# Patient Record
Sex: Male | Born: 1960
Health system: Southern US, Community
[De-identification: ages and names within clinical notes are randomized; demographics above are authoritative.]

## PROBLEM LIST (undated history)

## (undated) ENCOUNTER — Emergency Department (HOSPITAL_COMMUNITY): Admission: EM | Payer: 59 | Source: Home / Self Care

## (undated) DIAGNOSIS — I509 Heart failure, unspecified: Secondary | ICD-10-CM

## (undated) DIAGNOSIS — J449 Chronic obstructive pulmonary disease, unspecified: Secondary | ICD-10-CM

## (undated) DIAGNOSIS — I10 Essential (primary) hypertension: Secondary | ICD-10-CM

## (undated) DIAGNOSIS — M1990 Unspecified osteoarthritis, unspecified site: Secondary | ICD-10-CM

## (undated) DIAGNOSIS — E119 Type 2 diabetes mellitus without complications: Secondary | ICD-10-CM

## (undated) DIAGNOSIS — I2510 Atherosclerotic heart disease of native coronary artery without angina pectoris: Secondary | ICD-10-CM

## (undated) DIAGNOSIS — I480 Paroxysmal atrial fibrillation: Secondary | ICD-10-CM

## (undated) HISTORY — PX: HAND SURGERY: SHX662

## (undated) HISTORY — PX: BACK SURGERY: SHX140

## (undated) HISTORY — PX: FOOT SURGERY: SHX648

---

## 2007-12-16 ENCOUNTER — Emergency Department (HOSPITAL_COMMUNITY): Admission: EM | Admit: 2007-12-16 | Discharge: 2007-12-16 | Payer: Self-pay | Admitting: Emergency Medicine

## 2009-04-29 ENCOUNTER — Emergency Department (HOSPITAL_COMMUNITY): Admission: EM | Admit: 2009-04-29 | Discharge: 2009-04-29 | Payer: Self-pay | Admitting: Emergency Medicine

## 2009-11-13 ENCOUNTER — Emergency Department (HOSPITAL_COMMUNITY): Admission: EM | Admit: 2009-11-13 | Discharge: 2009-11-13 | Payer: Self-pay | Admitting: Emergency Medicine

## 2010-01-18 ENCOUNTER — Inpatient Hospital Stay (HOSPITAL_COMMUNITY): Admission: EM | Admit: 2010-01-18 | Discharge: 2010-01-21 | Payer: Self-pay | Admitting: Emergency Medicine

## 2010-11-15 ENCOUNTER — Emergency Department (HOSPITAL_COMMUNITY): Admission: EM | Admit: 2010-11-15 | Discharge: 2010-10-23 | Payer: Self-pay | Admitting: Emergency Medicine

## 2011-02-27 LAB — COMPREHENSIVE METABOLIC PANEL
ALT: 21 U/L (ref 0–53)
AST: 16 U/L (ref 0–37)
BUN: 14 mg/dL (ref 6–23)
Calcium: 8.8 mg/dL (ref 8.4–10.5)
GFR calc non Af Amer: >60 mL/min (ref >60)
Total Bilirubin: 0.5 mg/dL (ref 0.3–1.2)
Total Protein: 6.7 g/dL (ref 6.0–8.3)

## 2011-02-27 LAB — BASIC METABOLIC PANEL
BUN: 15 mg/dL (ref 6–23)
CO2: 23 mEq/L (ref 19–32)
CO2: 25 mEq/L (ref 19–32)
Calcium: 8.9 mg/dL (ref 8.4–10.5)
Calcium: 9.2 mg/dL (ref 8.4–10.5)
Chloride: 100 mEq/L (ref 96–112)
Chloride: 102 mEq/L (ref 96–112)
Creatinine, Ser: 0.69 mg/dL (ref 0.4–1.5)
GFR calc Af Amer: >60 mL/min (ref >60)
GFR calc Af Amer: >60 mL/min (ref >60)
GFR calc non Af Amer: >60 mL/min (ref >60)
GFR calc non Af Amer: >60 mL/min (ref >60)
Glucose, Bld: 322 mg/dL — ABNORMAL HIGH (ref 70–99)
Sodium: 129 mEq/L — ABNORMAL LOW (ref 135–145)
Sodium: 131 mEq/L — ABNORMAL LOW (ref 135–145)
Sodium: 135 mEq/L (ref 135–145)

## 2011-02-27 LAB — DIFFERENTIAL
Basophils Absolute: 0 10*3/uL (ref 0.0–0.1)
Basophils Absolute: 0.1 10*3/uL (ref 0.0–0.1)
Basophils Relative: 0 % (ref 0–1)
Eosinophils Relative: 2 % (ref 0–5)
Lymphocytes Relative: 9 % — ABNORMAL LOW (ref 12–46)
Lymphs Abs: 0.7 10*3/uL (ref 0.7–4.0)
Lymphs Abs: 0.8 10*3/uL (ref 0.7–4.0)
Monocytes Absolute: 0.6 10*3/uL (ref 0.1–1.0)
Monocytes Absolute: 1.3 10*3/uL — ABNORMAL HIGH (ref 0.1–1.0)
Monocytes Relative: 9 % (ref 3–12)
Neutro Abs: 6.5 10*3/uL (ref 1.7–7.7)
Neutrophils Relative %: 74 % (ref 43–77)
Neutrophils Relative %: 80 % — ABNORMAL HIGH (ref 43–77)

## 2011-02-27 LAB — BLOOD GAS, ARTERIAL
Acid-base deficit: 6.7 mmol/L — ABNORMAL HIGH (ref 0.0–2.0)
Bicarbonate: 15.4 mEq/L — ABNORMAL LOW (ref 20.0–24.0)
Patient temperature: 37
TCO2: 12.9 mmol/L (ref 0–100)
pCO2 arterial: 17.5 mmHg — CL (ref 35.0–45.0)
pH, Arterial: 7.552 — ABNORMAL HIGH (ref 7.350–7.450)
pO2, Arterial: 101 mmHg — ABNORMAL HIGH (ref 80.0–100.0)

## 2011-02-27 LAB — GLUCOSE, CAPILLARY
Glucose-Capillary: 236 mg/dL — ABNORMAL HIGH (ref 70–99)
Glucose-Capillary: 253 mg/dL — ABNORMAL HIGH (ref 70–99)
Glucose-Capillary: 295 mg/dL — ABNORMAL HIGH (ref 70–99)
Glucose-Capillary: 299 mg/dL — ABNORMAL HIGH (ref 70–99)
Glucose-Capillary: 320 mg/dL — ABNORMAL HIGH (ref 70–99)
Glucose-Capillary: 358 mg/dL — ABNORMAL HIGH (ref 70–99)
Glucose-Capillary: 79 mg/dL (ref 70–99)

## 2011-02-27 LAB — CBC
HCT: 43.8 % (ref 39.0–52.0)
HCT: 49.8 % (ref 39.0–52.0)
Hemoglobin: 15.1 g/dL (ref 13.0–17.0)
MCHC: 34.5 g/dL (ref 30.0–36.0)
MCV: 85.9 fL (ref 78.0–100.0)
RBC: 5.06 MIL/uL (ref 4.22–5.81)
RDW: 12.9 % (ref 11.5–15.5)
RDW: 13.1 % (ref 11.5–15.5)
WBC: 6.4 10*3/uL (ref 4.0–10.5)
WBC: 8.8 10*3/uL (ref 4.0–10.5)

## 2011-02-27 LAB — HEMOGLOBIN A1C: Mean Plasma Glucose: 235 mg/dL

## 2011-03-12 LAB — POCT CARDIAC MARKERS
CKMB, poc: 1.6 ng/mL (ref 1.0–8.0)
CKMB, poc: 1.8 ng/mL (ref 1.0–8.0)
Troponin i, poc: <0.05 ng/mL (ref 0.00–0.09)

## 2011-03-12 LAB — DIFFERENTIAL
Basophils Absolute: 0.1 10*3/uL (ref 0.0–0.1)
Basophils Relative: 1 % (ref 0–1)
Eosinophils Absolute: 0.2 10*3/uL (ref 0.0–0.7)
Eosinophils Relative: 2 % (ref 0–5)
Monocytes Absolute: 1 10*3/uL (ref 0.1–1.0)
Neutro Abs: 6.8 10*3/uL (ref 1.7–7.7)

## 2011-03-12 LAB — COMPREHENSIVE METABOLIC PANEL
ALT: 19 U/L (ref 0–53)
Albumin: 3.4 g/dL — ABNORMAL LOW (ref 3.5–5.2)
Alkaline Phosphatase: 116 U/L (ref 39–117)
BUN: 12 mg/dL (ref 6–23)
Chloride: 103 mEq/L (ref 96–112)
Potassium: 3.4 mEq/L — ABNORMAL LOW (ref 3.5–5.1)
Total Bilirubin: 0.5 mg/dL (ref 0.3–1.2)

## 2011-03-12 LAB — CBC
HCT: 45.9 % (ref 39.0–52.0)
Hemoglobin: 15.9 g/dL (ref 13.0–17.0)
Platelets: 245 10*3/uL (ref 150–400)
WBC: 10.9 10*3/uL — ABNORMAL HIGH (ref 4.0–10.5)

## 2011-04-25 ENCOUNTER — Emergency Department (HOSPITAL_COMMUNITY)
Admission: EM | Admit: 2011-04-25 | Discharge: 2011-04-25 | Disposition: A | Discharge status: Home / Self Care | Payer: Medicare Other | Attending: Emergency Medicine | Admitting: Emergency Medicine

## 2011-04-25 DIAGNOSIS — J4489 Other specified chronic obstructive pulmonary disease: Secondary | ICD-10-CM | POA: Insufficient documentation

## 2011-04-25 DIAGNOSIS — I10 Essential (primary) hypertension: Secondary | ICD-10-CM | POA: Insufficient documentation

## 2011-04-25 DIAGNOSIS — E119 Type 2 diabetes mellitus without complications: Secondary | ICD-10-CM | POA: Insufficient documentation

## 2011-04-25 DIAGNOSIS — M549 Dorsalgia, unspecified: Secondary | ICD-10-CM | POA: Insufficient documentation

## 2011-04-25 DIAGNOSIS — J449 Chronic obstructive pulmonary disease, unspecified: Secondary | ICD-10-CM | POA: Insufficient documentation

## 2011-05-06 ENCOUNTER — Emergency Department (HOSPITAL_COMMUNITY)
Admission: EM | Admit: 2011-05-06 | Discharge: 2011-05-06 | Disposition: A | Discharge status: Home / Self Care | Payer: Medicare Other | Attending: Emergency Medicine | Admitting: Emergency Medicine

## 2011-05-06 DIAGNOSIS — M545 Low back pain, unspecified: Secondary | ICD-10-CM | POA: Insufficient documentation

## 2011-05-06 DIAGNOSIS — E119 Type 2 diabetes mellitus without complications: Secondary | ICD-10-CM | POA: Insufficient documentation

## 2011-05-06 DIAGNOSIS — I10 Essential (primary) hypertension: Secondary | ICD-10-CM | POA: Insufficient documentation

## 2011-05-06 DIAGNOSIS — J449 Chronic obstructive pulmonary disease, unspecified: Secondary | ICD-10-CM | POA: Insufficient documentation

## 2011-05-06 DIAGNOSIS — J4489 Other specified chronic obstructive pulmonary disease: Secondary | ICD-10-CM | POA: Insufficient documentation

## 2012-05-02 ENCOUNTER — Emergency Department (HOSPITAL_COMMUNITY): Payer: Medicare Other

## 2012-05-02 ENCOUNTER — Emergency Department (HOSPITAL_COMMUNITY)
Admission: EM | Admit: 2012-05-02 | Discharge: 2012-05-03 | Disposition: A | Discharge status: Home / Self Care | Payer: Medicare Other | Attending: Emergency Medicine | Admitting: Emergency Medicine

## 2012-05-02 ENCOUNTER — Encounter (HOSPITAL_COMMUNITY): Payer: Self-pay

## 2012-05-02 DIAGNOSIS — J441 Chronic obstructive pulmonary disease with (acute) exacerbation: Secondary | ICD-10-CM | POA: Insufficient documentation

## 2012-05-02 DIAGNOSIS — R059 Cough, unspecified: Secondary | ICD-10-CM | POA: Insufficient documentation

## 2012-05-02 DIAGNOSIS — R05 Cough: Secondary | ICD-10-CM | POA: Insufficient documentation

## 2012-05-02 DIAGNOSIS — F17200 Nicotine dependence, unspecified, uncomplicated: Secondary | ICD-10-CM | POA: Insufficient documentation

## 2012-05-02 DIAGNOSIS — R0602 Shortness of breath: Secondary | ICD-10-CM | POA: Insufficient documentation

## 2012-05-02 DIAGNOSIS — E119 Type 2 diabetes mellitus without complications: Secondary | ICD-10-CM | POA: Insufficient documentation

## 2012-05-02 HISTORY — DX: Chronic obstructive pulmonary disease, unspecified: J44.9

## 2012-05-02 LAB — DIFFERENTIAL
Lymphocytes Relative: 28 % (ref 12–46)
Lymphs Abs: 2.9 10*3/uL (ref 0.7–4.0)
Monocytes Relative: 9 % (ref 3–12)
Neutro Abs: 6 10*3/uL (ref 1.7–7.7)
Neutrophils Relative %: 58 % (ref 43–77)

## 2012-05-02 LAB — BASIC METABOLIC PANEL
BUN: 14 mg/dL (ref 6–23)
Chloride: 99 mEq/L (ref 96–112)
Glucose, Bld: 165 mg/dL — ABNORMAL HIGH (ref 70–99)
Potassium: 3.8 mEq/L (ref 3.5–5.1)
Sodium: 134 mEq/L — ABNORMAL LOW (ref 135–145)

## 2012-05-02 LAB — CBC
Hemoglobin: 16 g/dL (ref 13.0–17.0)
Platelets: 251 10*3/uL (ref 150–400)
RBC: 5.22 MIL/uL (ref 4.22–5.81)
WBC: 10.4 10*3/uL (ref 4.0–10.5)

## 2012-05-02 MED ORDER — ALBUTEROL SULFATE (5 MG/ML) 0.5% IN NEBU
5.0000 mg | INHALATION_SOLUTION | Freq: Once | RESPIRATORY_TRACT | Status: AC
  Administered 2012-05-02: 5 mg via RESPIRATORY_TRACT
  Filled 2012-05-02: qty 1

## 2012-05-02 MED ORDER — SODIUM CHLORIDE 0.9 % IV BOLUS (SEPSIS)
500.0000 mL | INTRAVENOUS | Status: AC
  Administered 2012-05-02: 500 mL via INTRAVENOUS

## 2012-05-02 MED ORDER — PREDNISONE 20 MG PO TABS
60.0000 mg | ORAL_TABLET | Freq: Once | ORAL | Status: AC
  Administered 2012-05-02: 60 mg via ORAL
  Filled 2012-05-02: qty 3

## 2012-05-02 MED ORDER — IPRATROPIUM BROMIDE 0.02 % IN SOLN
0.5000 mg | Freq: Once | RESPIRATORY_TRACT | Status: AC
  Administered 2012-05-02: 0.5 mg via RESPIRATORY_TRACT
  Filled 2012-05-02: qty 2.5

## 2012-05-02 MED ORDER — IPRATROPIUM BROMIDE 0.02 % IN SOLN
RESPIRATORY_TRACT | Status: AC
  Administered 2012-05-02: 0.5 mg
  Filled 2012-05-02: qty 2.5

## 2012-05-02 MED ORDER — ALBUTEROL SULFATE (5 MG/ML) 0.5% IN NEBU
INHALATION_SOLUTION | RESPIRATORY_TRACT | Status: AC
  Administered 2012-05-02: 5 mg
  Filled 2012-05-02: qty 1

## 2012-05-02 MED ORDER — SODIUM CHLORIDE 0.9 % IV SOLN: INTRAVENOUS | Status: DC

## 2012-05-02 NOTE — ED Notes (Signed)
Portable monitoring of 02 sat --100% initially while on stretcher - then  Ambulated around nurses station- 02 sats dropped to a low of 92% mid way through walk- and returned to 98% by the time the patient was back to the strectcher -  Tolerated well.

## 2012-05-02 NOTE — ED Provider Notes (Cosign Needed)
History     CSN: [id number]  Arrival date & time 05/02/12  2004   First MD Initiated Contact with Patient 05/02/12 2020      Chief Complaint  Patient presents with  . Shortness of Breath    (Consider location/radiation/quality/duration/timing/severity/associated sxs/prior treatment) HPI patient relates yesterday he started feeling short of breath and having cough with yellow sputum production that is minimal. He denies fever, chest pain, nausea, vomiting, or diarrhea. He states his chest does feel tight like he is having trouble breathing. He does not have a nebulizer or inhaler.  PCP Dr. Sherryll Burger in [place]    Past Medical History  Diagnosis Date  . COPD (chronic obstructive pulmonary disease)   pneumonia Diabetes x 1 year  Past Surgical History  Procedure Date  . Back surgery   . Foot surgery     No family history on file.  History  Substance Use Topics  . Smoking status: Current Everyday Smoker -- 1.0 packs/day  . Smokeless tobacco: Not on file  . Alcohol Use: Yes  lives with spouse On disability for fx of back Last cocaine about 2 days ago, does monthly  Review of Systems  All other systems reviewed and are negative.    Allergies  Review of patient's allergies indicates no known allergies.  Home Medications   Current Outpatient Rx  Name Route Sig Dispense Refill  . GABAPENTIN 300 MG PO CAPS Oral Take 300 mg by mouth 3 (three) times daily.    Marland Kitchen GLIMEPIRIDE 2 MG PO TABS Oral Take 2 mg by mouth daily.    Marland Kitchen HYDROCODONE-ACETAMINOPHEN 5-500 MG PO TABS Oral Take 1 tablet by mouth 3 (three) times daily as needed. pain    . LISINOPRIL 20 MG PO TABS Oral Take 20 mg by mouth daily.    Marland Kitchen PREDNISONE 20 MG PO TABS  2 tabs po daily x 4 days 8 tablet 0   Gabapentin Amaryl Lisinopril Lortab  BP 157/79  Pulse 93  Temp(Src) 98.1 F (36.7 C) (Oral)  Resp 16  Ht 6' (1.829 m)  Wt 225 lb (102.059 kg)  BMI 30.52 kg/m2  SpO2 100%  Vital signs normal except  tachycardia   Physical Exam  Nursing note and vitals reviewed. Constitutional: He is oriented to person, place, and time. He appears well-developed and well-nourished.  Non-toxic appearance. He does not appear ill. No distress.       My exam was done after first breathing treatment which he states helped  HENT:  Head: Normocephalic and atraumatic.  Right Ear: External ear normal.  Left Ear: External ear normal.  Nose: Nose normal. No mucosal edema or rhinorrhea.  Mouth/Throat: Oropharynx is clear and moist and mucous membranes are normal. No dental abscesses or uvula swelling.  Eyes: Conjunctivae and EOM are normal. Pupils are equal, round, and reactive to light.  Neck: Normal range of motion and full passive range of motion without pain. Neck supple.  Cardiovascular: Normal rate, regular rhythm and normal heart sounds.  Exam reveals no gallop and no friction rub.   No murmur heard. Pulmonary/Chest: He is in respiratory distress. He has wheezes. He has no rhonchi. He has no rales. He exhibits no tenderness and no crepitus.       Patient has diffuse expiratory wheezing with mild retractions. He appears to get short of breath with talking.  Abdominal: Soft. Normal appearance and bowel sounds are normal. He exhibits no distension. There is no tenderness. There is no rebound and no guarding.  Musculoskeletal: Normal range of motion. He exhibits no edema and no tenderness.       Moves all extremities well.   Neurological: He is alert and oriented to person, place, and time. He has normal strength. No cranial nerve deficit.  Skin: Skin is warm, dry and intact. No rash noted. No erythema. No pallor.  Psychiatric: He has a normal mood and affect. His speech is normal and behavior is normal. His mood appears not anxious.    ED Course  Procedures (including critical care time)   Medications  ipratropium (ATROVENT) 0.02 % nebulizer solution (0.5 mg  Given 05/02/12 2026)  albuterol (PROVENTIL) (5  MG/ML) 0.5% nebulizer solution (5 mg  Given 05/02/12 2025)  albuterol (PROVENTIL) (5 MG/ML) 0.5% nebulizer solution 5 mg (5 mg Nebulization Given 05/02/12 2116)  ipratropium (ATROVENT) nebulizer solution 0.5 mg (0.5 mg Nebulization Given 05/02/12 2117)  predniSONE (DELTASONE) tablet 60 mg (60 mg Oral Given 05/02/12 2202)  albuterol (PROVENTIL) (5 MG/ML) 0.5% nebulizer solution 5 mg (5 mg Nebulization Given 05/02/12 2227)  ipratropium (ATROVENT) nebulizer solution 0.5 mg (0.5 mg Nebulization Given 05/02/12 2227)  sodium chloride 0.9 % bolus 500 mL (500 mL Intravenous Given 05/02/12 2253)  albuterol (PROVENTIL HFA;VENTOLIN HFA) 108 (90 BASE) MCG/ACT inhaler 6 puff (6 puff Inhalation Given 05/03/12 0119)   Recheck 22:00 after second nebulizer still has some retractions, still has diffuse expir wheezing, now some are lower pitched, and has improved air movement. Will get 3rd nebulizer and then ambulate, may need admission.    PT turned over to Dr Fonnie Jarvis at change of shift (22:50) to decide disposition  Results for orders placed during the hospital encounter of 05/02/12  CBC      Component Value Range   WBC 10.4  4.0 - 10.5 (K/uL)   RBC 5.22  4.22 - 5.81 (MIL/uL)   Hemoglobin 16.0  13.0 - 17.0 (g/dL)   HCT 16.1  09.6 - 04.5 (%)   MCV 85.6  78.0 - 100.0 (fL)   MCH 30.7  26.0 - 34.0 (pg)   MCHC 35.8  30.0 - 36.0 (g/dL)   RDW 40.9  81.1 - 91.4 (%)   Platelets 251  150 - 400 (K/uL)  DIFFERENTIAL      Component Value Range   Neutrophils Relative 58  43 - 77 (%)   Neutro Abs 6.0  1.7 - 7.7 (K/uL)   Lymphocytes Relative 28  12 - 46 (%)   Lymphs Abs 2.9  0.7 - 4.0 (K/uL)   Monocytes Relative 9  3 - 12 (%)   Monocytes Absolute 1.0  0.1 - 1.0 (K/uL)   Eosinophils Relative 5  0 - 5 (%)   Eosinophils Absolute 0.5  0.0 - 0.7 (K/uL)   Basophils Relative 1  0 - 1 (%)   Basophils Absolute 0.1  0.0 - 0.1 (K/uL)  BASIC METABOLIC PANEL      Component Value Range   Sodium 134 (*) 135 - 145 (mEq/L)    Potassium 3.8  3.5 - 5.1 (mEq/L)   Chloride 99  96 - 112 (mEq/L)   CO2 26  19 - 32 (mEq/L)   Glucose, Bld 165 (*) 70 - 99 (mg/dL)   BUN 14  6 - 23 (mg/dL)   Creatinine, Ser 7.82  0.50 - 1.35 (mg/dL)   Calcium 9.3  8.4 - 95.6 (mg/dL)   GFR calc non Af Amer >90  >90 (mL/min)   GFR calc Af Amer >90  >90 (mL/min)   Laboratory  interpretation all normal except mild hyperglycemia and mild hyponatremia   Dg Chest 2 View  05/02/2012  *RADIOLOGY REPORT*  Clinical Data: Shortness of breath progressing since yesterday.  CHEST - 2 VIEW  Comparison: Q. 1011  Findings: Normal heart size and pulmonary vascularity. Emphysematous changes and scattered fibrosis in the lungs.  No blunting of costophrenic angles.  No focal airspace consolidation. Suggestion of central bronchiectasis.  Peribronchial thickening suggesting chronic bronchitis.  No pneumothorax. Calcification of the aorta.  Degenerative changes in the spine. Old fracture deformity of the mid shaft left clavicle.  Stable appearance since previous study.  IMPRESSION: Emphysematous changes and scattered fibrosis in the lungs.  Chronic bronchitic changes.  No focal consolidation.  Original Report Authenticated By: Marlon Pel, M.D.       Date: 05/02/2012  Rate: 95  Rhythm: normal sinus rhythm  QRS Axis: normal  Intervals: normal  ST/T Wave abnormalities: normal  Conduction Disutrbances:none  Narrative Interpretation: Q waves septally.  Old EKG Reviewed: none available    1. COPD with acute exacerbation     Disposition per Dr Ronette Deter, MD, FACEP   MDM          Ward Givens, MD 05/04/12 [phone]

## 2012-05-02 NOTE — ED Notes (Signed)
Dr. Lynelle Doctor in to re-assess and update patient

## 2012-05-02 NOTE — ED Notes (Signed)
Pt presents with SOB. Pt has Hx of COPD. Pt states SOB started yesterday and has progressed today. Pt gasping for breath. Pt denies home O2 use. Pt states he smokes.

## 2012-05-03 MED ORDER — ALBUTEROL SULFATE HFA 108 (90 BASE) MCG/ACT IN AERS
6.0000 | INHALATION_SPRAY | Freq: Once | RESPIRATORY_TRACT | Status: AC
  Administered 2012-05-03: 6 via RESPIRATORY_TRACT
  Filled 2012-05-03: qty 6.7

## 2012-05-03 MED ORDER — PREDNISONE 20 MG PO TABS: ORAL_TABLET | ORAL | Status: AC

## 2012-05-03 NOTE — ED Provider Notes (Signed)
The patient ambulated in the department with pulse oximetry still within normal limits at 92% improving to 96% at rest with diffuse wheezes on re-examination, the patient is speaking full sentences easily and is comfortable with discharge home. Doubt SBI.  Hurman Horn, MD 05/04/12 2308

## 2012-05-03 NOTE — Discharge Instructions (Signed)
Chronic Obstructive Pulmonary Disease Chronic obstructive pulmonary disease (COPD) is a lung disease. The lungs become damaged, making it hard to get air in and out of your lungs. The damage to your lungs cannot be changed.  HOME CARE  Stop smoking if you smoke. Avoid secondhand smoke.   Only take medicine as told by your doctor.   Talk to your doctor about using cough syrup or over-the-counter medicines.   Drink enough fluids to keep your pee (urine) clear or pale yellow.   Use a humidifier or vaporizer. This may help loosen the thick spit (mucus).   Talk to your doctor about vaccines that help prevent other lung problems (pneumonia and flu vaccines).   Use home oxygen as told by your doctor.   Stay active and exercise.   Eat healthy foods.  GET HELP RIGHT AWAY IF:   Your heart is beating fast.   You become disturbed, confused, shake, or are dazed.   You have trouble breathing.   You have chest pain.   You have a fever.   You cough up thick spit that is yellowish-white or green.   Your breathing becomes worse when you exercise.   You are running out of the medicine you take for your breathing.  MAKE SURE YOU:   Understand these instructions.   Will watch your condition.   Will get help right away if you are not doing well or get worse.  Document Released: 05/13/2008 Document Revised: 11/14/2011 Document Reviewed: 01/25/2011 [facility] Patient Information 2012 [place], [place].  RETURN IMMEDIATELY IF you develop worse shortness of breath, confusion or altered mental status, a new rash, become dizzy, faint, or poorly responsive, or are unable to be cared for at home.

## 2012-05-20 ENCOUNTER — Emergency Department (HOSPITAL_COMMUNITY)
Admission: EM | Admit: 2012-05-20 | Discharge: 2012-05-20 | Disposition: A | Discharge status: Home / Self Care | Payer: Medicare Other | Attending: Emergency Medicine | Admitting: Emergency Medicine

## 2012-05-20 ENCOUNTER — Emergency Department (HOSPITAL_COMMUNITY): Payer: Medicare Other

## 2012-05-20 ENCOUNTER — Encounter (HOSPITAL_COMMUNITY): Payer: Self-pay | Admitting: *Deleted

## 2012-05-20 DIAGNOSIS — R0602 Shortness of breath: Secondary | ICD-10-CM | POA: Insufficient documentation

## 2012-05-20 DIAGNOSIS — J45909 Unspecified asthma, uncomplicated: Secondary | ICD-10-CM | POA: Insufficient documentation

## 2012-05-20 DIAGNOSIS — I10 Essential (primary) hypertension: Secondary | ICD-10-CM | POA: Insufficient documentation

## 2012-05-20 DIAGNOSIS — Z79899 Other long term (current) drug therapy: Secondary | ICD-10-CM | POA: Insufficient documentation

## 2012-05-20 DIAGNOSIS — F17200 Nicotine dependence, unspecified, uncomplicated: Secondary | ICD-10-CM | POA: Insufficient documentation

## 2012-05-20 HISTORY — DX: Essential (primary) hypertension: I10

## 2012-05-20 MED ORDER — PREDNISONE 20 MG PO TABS
60.0000 mg | ORAL_TABLET | Freq: Once | ORAL | Status: AC
  Administered 2012-05-20: 60 mg via ORAL
  Filled 2012-05-20: qty 3

## 2012-05-20 MED ORDER — IPRATROPIUM BROMIDE 0.02 % IN SOLN
RESPIRATORY_TRACT | Status: AC
  Administered 2012-05-20: 0.5 mg via RESPIRATORY_TRACT
  Filled 2012-05-20: qty 2.5

## 2012-05-20 MED ORDER — ALBUTEROL SULFATE (5 MG/ML) 0.5% IN NEBU
5.0000 mg | INHALATION_SOLUTION | Freq: Once | RESPIRATORY_TRACT | Status: AC
  Administered 2012-05-20: 5 mg via RESPIRATORY_TRACT

## 2012-05-20 MED ORDER — IPRATROPIUM BROMIDE 0.02 % IN SOLN
0.5000 mg | Freq: Once | RESPIRATORY_TRACT | Status: AC
  Administered 2012-05-20: 0.5 mg via RESPIRATORY_TRACT

## 2012-05-20 MED ORDER — ALBUTEROL SULFATE (5 MG/ML) 0.5% IN NEBU
INHALATION_SOLUTION | RESPIRATORY_TRACT | Status: AC
  Administered 2012-05-20: 5 mg via RESPIRATORY_TRACT
  Filled 2012-05-20: qty 1

## 2012-05-20 MED ORDER — PREDNISONE 50 MG PO TABS: 50.0000 mg | ORAL_TABLET | Freq: Every day | ORAL | Status: AC

## 2012-05-20 MED ORDER — ALBUTEROL SULFATE HFA 108 (90 BASE) MCG/ACT IN AERS
2.0000 | INHALATION_SPRAY | RESPIRATORY_TRACT | Status: DC | PRN
  Administered 2012-05-20: 2 via RESPIRATORY_TRACT
  Filled 2012-05-20: qty 6.7

## 2012-05-20 NOTE — ED Notes (Signed)
Sob started last night, worse today.  Audible wheezing heard on arrival.  Pt tripoding, speaking short phrases.

## 2012-05-20 NOTE — ED Notes (Signed)
Remains resting sitting up in bed. Equal chest rise and fall, regular, unlabored. Call bell within reach. Denies needs. Family at bedside. Will continue to monitor.

## 2012-05-20 NOTE — ED Notes (Signed)
Resting sitting up in bed. Medicated as ordered per MD. States he is feeling better. Denies needs. No distress. Call bell within reach. Family with patient.

## 2012-05-20 NOTE — Discharge Instructions (Signed)
Use your inhaler every 3 hours as needed. Prednisone for one week. Increase fluids. Stay out of hot sun.

## 2012-05-20 NOTE — ED Provider Notes (Signed)
History     CSN: [id number]  Arrival date & time 05/20/12  1708   First MD Initiated Contact with Patient 05/20/12 1725      Chief Complaint  Patient presents with  . Shortness of Breath    (Consider location/radiation/quality/duration/timing/severity/associated sxs/prior treatment) HPI...wheezing and short of breath since this morning. Patient has COPD and is a smoker.  No fever, sweats, chills, rusty sputum. He has ran out of his inhaler. Nothing makes symptoms worse. It is moderate.  Past Medical History  Diagnosis Date  . COPD (chronic obstructive pulmonary disease)   . Hypertension     Past Surgical History  Procedure Date  . Back surgery   . Foot surgery     No family history on file.  History  Substance Use Topics  . Smoking status: Current Everyday Smoker -- 1.0 packs/day    Types: Cigarettes  . Smokeless tobacco: Not on file  . Alcohol Use: Yes     once a week      Review of Systems  All other systems reviewed and are negative.    Allergies  Review of patient's allergies indicates no known allergies.  Home Medications   Current Outpatient Rx  Name Route Sig Dispense Refill  . GLIMEPIRIDE 2 MG PO TABS Oral Take 2 mg by mouth every morning.     Marland Kitchen LISINOPRIL 20 MG PO TABS Oral Take 20 mg by mouth daily.    Marland Kitchen GABAPENTIN 300 MG PO CAPS Oral Take 300 mg by mouth 3 (three) times daily.    Marland Kitchen HYDROCODONE-ACETAMINOPHEN 5-500 MG PO TABS Oral Take 1 tablet by mouth 3 (three) times daily as needed. pain      BP 155/88  Pulse 92  Temp 97.8 F (36.6 C) (Oral)  Resp 18  Ht 6' (1.829 m)  Wt 220 lb (99.791 kg)  BMI 29.84 kg/m2  SpO2 98%  Physical Exam  Nursing note and vitals reviewed. Constitutional: He is oriented to person, place, and time. He appears well-developed and well-nourished.  HENT:  Head: Normocephalic and atraumatic.  Eyes: Conjunctivae and EOM are normal. Pupils are equal, round, and reactive to light.  Neck: Normal range of motion.  Neck supple.  Cardiovascular: Normal rate and regular rhythm.   Pulmonary/Chest: Effort normal.       Bilateral expiratory wheeze  Abdominal: Soft. Bowel sounds are normal.  Musculoskeletal: Normal range of motion.  Neurological: He is alert and oriented to person, place, and time.  Skin: Skin is warm and dry.  Psychiatric: He has a normal mood and affect.    ED Course  Procedures (including critical care time)  Labs Reviewed - No data to display No results found.   No diagnosis found.   Date: 05/20/2012  Rate: 109  Rhythm: sinus tachycardia  QRS Axis: normal  Intervals: normal  ST/T Wave abnormalities: normal  Conduction Disutrbances:none  Narrative Interpretation:   Old EKG Reviewed: changes noted   MDM  Patient feeling better after albuterol Atrovent breathing treatment. Prednisone started.        Donnetta Hutching, MD 05/20/12 2029

## 2012-08-24 ENCOUNTER — Encounter (HOSPITAL_COMMUNITY): Payer: Self-pay

## 2012-08-24 ENCOUNTER — Emergency Department (HOSPITAL_COMMUNITY)
Admission: EM | Admit: 2012-08-24 | Discharge: 2012-08-24 | Disposition: A | Discharge status: Home / Self Care | Payer: Medicare Other | Attending: Emergency Medicine | Admitting: Emergency Medicine

## 2012-08-24 ENCOUNTER — Emergency Department (HOSPITAL_COMMUNITY): Payer: Medicare Other

## 2012-08-24 DIAGNOSIS — W241XXA Contact with transmission devices, not elsewhere classified, initial encounter: Secondary | ICD-10-CM | POA: Insufficient documentation

## 2012-08-24 DIAGNOSIS — S61209A Unspecified open wound of unspecified finger without damage to nail, initial encounter: Secondary | ICD-10-CM | POA: Insufficient documentation

## 2012-08-24 DIAGNOSIS — Z23 Encounter for immunization: Secondary | ICD-10-CM | POA: Insufficient documentation

## 2012-08-24 DIAGNOSIS — S61218A Laceration without foreign body of other finger without damage to nail, initial encounter: Secondary | ICD-10-CM

## 2012-08-24 DIAGNOSIS — Y990 Civilian activity done for income or pay: Secondary | ICD-10-CM | POA: Insufficient documentation

## 2012-08-24 DIAGNOSIS — S62309A Unspecified fracture of unspecified metacarpal bone, initial encounter for closed fracture: Secondary | ICD-10-CM

## 2012-08-24 DIAGNOSIS — F17200 Nicotine dependence, unspecified, uncomplicated: Secondary | ICD-10-CM | POA: Insufficient documentation

## 2012-08-24 DIAGNOSIS — I10 Essential (primary) hypertension: Secondary | ICD-10-CM | POA: Insufficient documentation

## 2012-08-24 LAB — CBC WITH DIFFERENTIAL/PLATELET
Eosinophils Absolute: 0.3 10*3/uL (ref 0.0–0.7)
Hemoglobin: 16.5 g/dL (ref 13.0–17.0)
Lymphocytes Relative: 20 % (ref 12–46)
Lymphs Abs: 2.2 10*3/uL (ref 0.7–4.0)
MCH: 30.8 pg (ref 26.0–34.0)
Monocytes Relative: 6 % (ref 3–12)
Neutro Abs: 7.8 10*3/uL — ABNORMAL HIGH (ref 1.7–7.7)
Neutrophils Relative %: 71 % (ref 43–77)
Platelets: 252 10*3/uL (ref 150–400)
RBC: 5.36 MIL/uL (ref 4.22–5.81)
WBC: 11 10*3/uL — ABNORMAL HIGH (ref 4.0–10.5)

## 2012-08-24 LAB — BASIC METABOLIC PANEL
BUN: 20 mg/dL (ref 6–23)
CO2: 28 mEq/L (ref 19–32)
Chloride: 99 mEq/L (ref 96–112)
Glucose, Bld: 132 mg/dL — ABNORMAL HIGH (ref 70–99)
Potassium: 3.9 mEq/L (ref 3.5–5.1)
Sodium: 137 mEq/L (ref 135–145)

## 2012-08-24 MED ORDER — HYDROCODONE-ACETAMINOPHEN 5-325 MG PO TABS: 1.0000 | ORAL_TABLET | Freq: Four times a day (QID) | ORAL | Status: DC | PRN

## 2012-08-24 MED ORDER — LIDOCAINE HCL (PF) 2 % IJ SOLN
INTRAMUSCULAR | Status: AC
  Administered 2012-08-24: 11:00:00
  Filled 2012-08-24: qty 10

## 2012-08-24 MED ORDER — TETANUS-DIPHTH-ACELL PERTUSSIS 5-2.5-18.5 LF-MCG/0.5 IM SUSP
0.5000 mL | Freq: Once | INTRAMUSCULAR | Status: AC
  Administered 2012-08-24: 0.5 mL via INTRAMUSCULAR
  Filled 2012-08-24: qty 0.5

## 2012-08-24 NOTE — ED Provider Notes (Addendum)
History   This chart was scribed for Kevin Jakes, MD by Gerlean Ren. This patient was seen in room APA03/[facility]03 and the patient's care was started at 9:51AM.   CSN: [id number]  Arrival date & time 08/24/12  4098   First MD Initiated Contact with Patient 08/24/12 [phone]      Chief Complaint  Patient presents with  . Hand Pain    (Consider location/radiation/quality/duration/timing/severity/associated sxs/prior treatment) Patient is a 51 y.o. male presenting with hand pain. The history is provided by the patient. No language interpreter was used.  Hand Pain Pertinent negatives include no chest pain, no abdominal pain and no shortness of breath.   Kevin Underwood is a 51 y.o. male who presents to the Emergency Department complaining of a laceration to the right index finger after a chain on operating machinery was dragged against the finger.  Pt was unsure of most recent tetanus shot PTA, and received tetanus shot while here.  Pt also reports a chipped tooth that occurred when part of the machinery hit him, but he sustained no further injures.  Pt has a h/o HTN.  Pt is a current everyday smoker (1pack/day) and reports alcohol use.  Past Medical History  Diagnosis Date  . COPD (chronic obstructive pulmonary disease)   . Hypertension     Past Surgical History  Procedure Date  . Back surgery   . Foot surgery     No family history on file.  History  Substance Use Topics  . Smoking status: Current Every Day Smoker -- 1.0 packs/day    Types: Cigarettes  . Smokeless tobacco: Not on file  . Alcohol Use: Yes     once a week      Review of Systems  Constitutional: Negative for fever.  HENT: Negative for neck pain.   Eyes: Negative for visual disturbance.  Respiratory: Negative for shortness of breath.   Cardiovascular: Negative for chest pain.  Gastrointestinal: Negative for abdominal pain.  Genitourinary: Negative for dysuria.  Musculoskeletal: Negative for back pain.    Skin: Positive for wound.  Neurological: Negative for light-headedness.  All other systems reviewed and are negative.    Allergies  Review of patient's allergies indicates no known allergies.  Home Medications   Current Outpatient Rx  Name Route Sig Dispense Refill  . GABAPENTIN 300 MG PO CAPS Oral Take 300 mg by mouth daily as needed. Nerve Pain    . GLIMEPIRIDE 2 MG PO TABS Oral Take 2 mg by mouth every morning.     Marland Kitchen HYDROCODONE-ACETAMINOPHEN 5-500 MG PO TABS Oral Take 1 tablet by mouth 3 (three) times daily as needed. pain    . LISINOPRIL 20 MG PO TABS Oral Take 20 mg by mouth daily.    Marland Kitchen METFORMIN HCL 500 MG PO TABS Oral Take 1,000 mg by mouth 2 (two) times daily with a meal.    . HYDROCODONE-ACETAMINOPHEN 5-325 MG PO TABS Oral Take 1-2 tablets by mouth every 6 (six) hours as needed for pain. 10 tablet 0    BP 132/97  Pulse 96  Temp 97.8 F (36.6 C) (Oral)  Resp 18  Ht 5\' 10"  (1.778 m)  Wt 214 lb (97.07 kg)  BMI 30.71 kg/m2  SpO2 97%  Physical Exam  Nursing note and vitals reviewed. Constitutional: He is oriented to person, place, and time. He appears well-developed and well-nourished.  HENT:  Head: Normocephalic and atraumatic.  Mouth/Throat: Oropharynx is clear and moist.       Chipped  upper right second incisor.  Eyes: Conjunctivae normal and EOM are normal.  Neck: Normal range of motion. Neck supple. No tracheal deviation present.  Cardiovascular: Normal rate, regular rhythm and normal heart sounds.   No murmur heard. Pulmonary/Chest: Effort normal and breath sounds normal. He has no wheezes.  Abdominal: Soft. Bowel sounds are normal. There is no tenderness.  Musculoskeletal: Normal range of motion.       Large 5cm evulsion laceration on right index finger extending from proximal phalangeal knuckle to MP part of knuckle. Good range, extension and flexion of index finger.   Sensation intact.   Swelling and tenderness in distal fourth and fifth  metacarpals. Capillary refill normal.   Neurological: He is alert and oriented to person, place, and time. No cranial nerve deficit. Coordination normal.  Skin: Skin is warm and dry.  Psychiatric: He has a normal mood and affect.    ED Course  Procedures: LACERATION REPAIR RIGHT INDEX FINGER - patient identified by arm band. Permission for procedure given by the patient. Procedural time out taken before repair of laceration to the right index finger. The patient soaked the finger in a Betadine solution. A digital block was achieved with 2% plain lidocaine. Following this the wound was cleansed with a Betadine scrub brush as well as the right hand. The wound was irrigated with sterile saline. Following this using sterile technique the wound was closed with 12 interrupted sutures of 4-0 nylon. There is no bone or tendon involvement. The wound measures 5.6 cm. Sterile dressing and finger splint applied. Patient tolerated procedure without problem or complication. DIAGNOSTIC STUDIES: Oxygen Saturation is 97% on room air, normal by my interpretation.    COORDINATION OF CARE: 10:15AM- Ordered wound to be cleaned.   Labs Reviewed  CBC WITH DIFFERENTIAL - Abnormal; Notable for the following:    WBC 11.0 (*)     Neutro Abs 7.8 (*)     All other components within normal limits  BASIC METABOLIC PANEL - Abnormal; Notable for the following:    Glucose, Bld 132 (*)     All other components within normal limits   Dg Hand Complete Right  08/24/2012  *RADIOLOGY REPORT*  Clinical Data: Right hand pain  RIGHT HAND - COMPLETE 3+ VIEW  Comparison: None.  Findings: There is a V-shaped radiopaque density projecting over the fourth metacarpal with associated osseous hypertrophy, may reflect postoperative change or foreign body.  There is an oblique fracture through the midshaft fifth metacarpal.  No intra-articular extension. Very mild volar angulation/displacement of the fracture.  IMPRESSION: Oblique fracture  through the fifth metacarpal with minimal displacement and angulation.  V shaped radiopaque density projecting over the fourth metacarpal may be postoperative or foreign body.  Adjacent osseous hypertrophy favors prior fourth metacarpal injury.   Original Report Authenticated By: Waneta Martins, M.D.    Results for orders placed during the hospital encounter of 08/24/12  CBC WITH DIFFERENTIAL      Component Value Range   WBC 11.0 (*) 4.0 - 10.5 K/uL   RBC 5.36  4.22 - 5.81 MIL/uL   Hemoglobin 16.5  13.0 - 17.0 g/dL   HCT 40.9  81.1 - 91.4 %   MCV 85.6  78.0 - 100.0 fL   MCH 30.8  26.0 - 34.0 pg   MCHC 35.9  30.0 - 36.0 g/dL   RDW 78.2  95.6 - 21.3 %   Platelets 252  150 - 400 K/uL   Neutrophils Relative 71  43 -  77 %   Neutro Abs 7.8 (*) 1.7 - 7.7 K/uL   Lymphocytes Relative 20  12 - 46 %   Lymphs Abs 2.2  0.7 - 4.0 K/uL   Monocytes Relative 6  3 - 12 %   Monocytes Absolute 0.7  0.1 - 1.0 K/uL   Eosinophils Relative 2  0 - 5 %   Eosinophils Absolute 0.3  0.0 - 0.7 K/uL   Basophils Relative 0  0 - 1 %   Basophils Absolute 0.0  0.0 - 0.1 K/uL  BASIC METABOLIC PANEL      Component Value Range   Sodium 137  135 - 145 mEq/L   Potassium 3.9  3.5 - 5.1 mEq/L   Chloride 99  96 - 112 mEq/L   CO2 28  19 - 32 mEq/L   Glucose, Bld 132 (*) 70 - 99 mg/dL   BUN 20  6 - 23 mg/dL   Creatinine, Ser 2.13  0.50 - 1.35 mg/dL   Calcium 9.8  8.4 - 08.6 mg/dL   GFR calc non Af Amer >90  >90 mL/min   GFR calc Af Amer >90  >90 mL/min     1. Closed fracture of metacarpal of right hand   2. Laceration of index finger       MDM  Patient with injury to his right hand from a chain at work. Resulting in a closed fracture of his fifth metacarpal had an old fracture with open of his fourth metacarpal. Also with an avulsed laceration to his right index finger of the same hand no direct continuity of the laceration with joint or with the fracture obviously. Laceration closed by the mid-level that would  make this a shared visit. Patient placed in an ulnar gutter splint for the fifth metacarpal fracture and referred to orthopedics. Patient will elevate the arm is much as possible sutures can be removed in 7-10 days.  Medical screening examination/treatment/procedure(s) were conducted as a shared visit with non-physician practitioner(s) and myself.  I personally evaluated the patient during the encounter  I personally performed the services described in this documentation, which was scribed in my presence. The recorded information has been reviewed and considered.          Kevin Jakes, MD 08/24/12 1248  Kevin Jakes, MD 08/25/12 [phone]

## 2012-08-24 NOTE — ED Notes (Signed)
Pt reports "mashed" r hand with chain.  Says approx 400lb grapple rolled over r hand.  Laceration to r index finger.

## 2012-08-24 NOTE — ED Notes (Signed)
Pt soaking his rt index finger in betadine diluted with sterile water.

## 2012-08-28 ENCOUNTER — Emergency Department (HOSPITAL_COMMUNITY)
Admission: EM | Admit: 2012-08-28 | Discharge: 2012-08-28 | Disposition: A | Discharge status: Home / Self Care | Payer: Medicare Other | Attending: Emergency Medicine | Admitting: Emergency Medicine

## 2012-08-28 ENCOUNTER — Encounter (HOSPITAL_COMMUNITY): Payer: Self-pay | Admitting: *Deleted

## 2012-08-28 DIAGNOSIS — W268XXA Contact with other sharp object(s), not elsewhere classified, initial encounter: Secondary | ICD-10-CM | POA: Insufficient documentation

## 2012-08-28 DIAGNOSIS — Z91199 Patient's noncompliance with other medical treatment and regimen due to unspecified reason: Secondary | ICD-10-CM | POA: Insufficient documentation

## 2012-08-28 DIAGNOSIS — J449 Chronic obstructive pulmonary disease, unspecified: Secondary | ICD-10-CM | POA: Insufficient documentation

## 2012-08-28 DIAGNOSIS — E119 Type 2 diabetes mellitus without complications: Secondary | ICD-10-CM | POA: Insufficient documentation

## 2012-08-28 DIAGNOSIS — L089 Local infection of the skin and subcutaneous tissue, unspecified: Secondary | ICD-10-CM | POA: Insufficient documentation

## 2012-08-28 DIAGNOSIS — I10 Essential (primary) hypertension: Secondary | ICD-10-CM | POA: Insufficient documentation

## 2012-08-28 DIAGNOSIS — F17200 Nicotine dependence, unspecified, uncomplicated: Secondary | ICD-10-CM | POA: Insufficient documentation

## 2012-08-28 DIAGNOSIS — J4489 Other specified chronic obstructive pulmonary disease: Secondary | ICD-10-CM | POA: Insufficient documentation

## 2012-08-28 DIAGNOSIS — Z9119 Patient's noncompliance with other medical treatment and regimen: Secondary | ICD-10-CM | POA: Insufficient documentation

## 2012-08-28 DIAGNOSIS — T148XXA Other injury of unspecified body region, initial encounter: Secondary | ICD-10-CM | POA: Insufficient documentation

## 2012-08-28 LAB — CBC WITH DIFFERENTIAL/PLATELET
Basophils Relative: 0 % (ref 0–1)
Eosinophils Absolute: 0.3 10*3/uL (ref 0.0–0.7)
HCT: 42.7 % (ref 39.0–52.0)
Hemoglobin: 15.1 g/dL (ref 13.0–17.0)
Lymphs Abs: 2.1 10*3/uL (ref 0.7–4.0)
MCH: 30.6 pg (ref 26.0–34.0)
MCHC: 35.4 g/dL (ref 30.0–36.0)
MCV: 86.6 fL (ref 78.0–100.0)
Monocytes Absolute: 1.2 10*3/uL — ABNORMAL HIGH (ref 0.1–1.0)
Monocytes Relative: 9 % (ref 3–12)
Neutrophils Relative %: 71 % (ref 43–77)
RBC: 4.93 MIL/uL (ref 4.22–5.81)

## 2012-08-28 LAB — BASIC METABOLIC PANEL
BUN: 12 mg/dL (ref 6–23)
Chloride: 97 mEq/L (ref 96–112)
Creatinine, Ser: 0.62 mg/dL (ref 0.50–1.35)
GFR calc Af Amer: >90 mL/min (ref >90)
GFR calc non Af Amer: >90 mL/min (ref >90)
Glucose, Bld: 190 mg/dL — ABNORMAL HIGH (ref 70–99)
Potassium: 3.6 mEq/L (ref 3.5–5.1)

## 2012-08-28 MED ORDER — MORPHINE SULFATE 4 MG/ML IJ SOLN
4.0000 mg | Freq: Once | INTRAMUSCULAR | Status: AC
  Administered 2012-08-28: 4 mg via INTRAVENOUS
  Filled 2012-08-28: qty 1

## 2012-08-28 MED ORDER — DOXYCYCLINE HYCLATE 100 MG PO CAPS: 100.0000 mg | ORAL_CAPSULE | Freq: Two times a day (BID) | ORAL | Status: DC

## 2012-08-28 MED ORDER — ONDANSETRON HCL 4 MG/2ML IJ SOLN
4.0000 mg | Freq: Once | INTRAMUSCULAR | Status: AC
  Administered 2012-08-28: 4 mg via INTRAVENOUS
  Filled 2012-08-28: qty 2

## 2012-08-28 MED ORDER — VANCOMYCIN HCL IN DEXTROSE 1-5 GM/200ML-% IV SOLN
1000.0000 mg | Freq: Once | INTRAVENOUS | Status: AC
  Administered 2012-08-28: 1000 mg via INTRAVENOUS
  Filled 2012-08-28: qty 200

## 2012-08-28 MED ORDER — HYDROCODONE-ACETAMINOPHEN 5-325 MG PO TABS: 1.0000 | ORAL_TABLET | Freq: Four times a day (QID) | ORAL | Status: DC | PRN

## 2012-08-28 MED ORDER — SODIUM CHLORIDE 0.9 % IV SOLN
INTRAVENOUS | Status: DC
  Administered 2012-08-28: 17:00:00 via INTRAVENOUS

## 2012-08-28 NOTE — ED Notes (Signed)
Pain rt hand, sutures to RIF, Hand red and appears to have some infection with pus.

## 2012-08-28 NOTE — ED Notes (Signed)
Right index finger laceration well approximated w/sutures; however, site is edematous, erythematous and tender to touch.  Erythema extending toward middle dorsal area of hand.  Patient states pain is throbbing and wakes him up at night.  He had to remove splint d/t swelling.

## 2012-08-28 NOTE — ED Provider Notes (Signed)
Medical screening examination/treatment/procedure(s) were conducted as a shared visit with non-physician practitioner(s) and myself.  I personally evaluated the patient during the encounter  Patient seen by me. Patient also seen by me during the original visit. Patient does have a localized infection under the flap of the index finger. Patient received 1 g of vancomycin in the emergency apartment will be sent home with doxycycline. Discussed with hand surgeon on call Dr. Izora Ribas, patient also does have a fifth metacarpal fracture which he is scheduled to followup with Dr. Romeo Apple from orthopedics here locally on Monday. Dr. Izora Ribas thought that it would be fine for him to reassess with Dr. Romeo Apple.  No sausage finger distally from the flap no evidence of infection no increased pain with range of motion to suggest a extensor tendon tenosynovium this. No tenderness on the flexor side. Every other suture was removed and then a few additional ones and some pus milked mostly from the distal part of the wound.  Patient will continue elevation the doxycycline and will soak the finger at least once a day for 20 minutes in warm water. He will remove his ulnar gutter splint when he does that. Patient will followup with Dr. Romeo Apple on Monday. He will return here for any new or worse symptoms over the weekend.  [name], MD 08/28/12 [phone]

## 2012-08-28 NOTE — ED Provider Notes (Signed)
History     CSN: [id number]  Arrival date & time 08/28/12  1535   First MD Initiated Contact with Patient 08/28/12 1550      Chief Complaint  Patient presents with  . Hand Pain    (Consider location/radiation/quality/duration/timing/severity/associated sxs/prior treatment) HPI Comments: Kevin Underwood present with increased pain,  Swelling and redness to his right index finger since he had sutures placed here 4 days ago for a laceration sustained by a chain that snapped when he was helping with tree work 4 days ago.  His pain is constant, throbbing and radiating into his dorsal hand.  He is taking hydrocodone for pain relief which is not relieving his symptoms completely.  He denies fevers and chills and has no radiation of pain into his wrist or forearm.  He is diabetic but does not check his cbg's routinely.    The history is provided by the patient.    Past Medical History  Diagnosis Date  . COPD (chronic obstructive pulmonary disease)   . Hypertension   . Diabetes mellitus     Past Surgical History  Procedure Date  . Back surgery   . Foot surgery     History reviewed. No pertinent family history.  History  Substance Use Topics  . Smoking status: Current Every Day Smoker -- 1.0 packs/day    Types: Cigarettes  . Smokeless tobacco: Not on file  . Alcohol Use: Yes     once a week      Review of Systems  Constitutional: Negative for fever and chills.  HENT: Negative for facial swelling.   Respiratory: Negative for shortness of breath and wheezing.   Skin: Positive for color change and wound.  Neurological: Negative for numbness.    Allergies  Review of patient's allergies indicates no known allergies.  Home Medications   Current Outpatient Rx  Name Route Sig Dispense Refill  . GABAPENTIN 300 MG PO CAPS Oral Take 300 mg by mouth daily as needed. Nerve Pain    . GLIMEPIRIDE 2 MG PO TABS Oral Take 2 mg by mouth every morning.     Marland Kitchen  HYDROCODONE-ACETAMINOPHEN 5-500 MG PO TABS Oral Take 1 tablet by mouth 3 (three) times daily as needed. pain    . LISINOPRIL 20 MG PO TABS Oral Take 20 mg by mouth daily.    Marland Kitchen METFORMIN HCL 500 MG PO TABS Oral Take 1,000 mg by mouth 2 (two) times daily with a meal.    . DOXYCYCLINE HYCLATE 100 MG PO CAPS Oral Take 1 capsule (100 mg total) by mouth 2 (two) times daily. 20 capsule 0  . HYDROCODONE-ACETAMINOPHEN 5-325 MG PO TABS Oral Take 1-2 tablets by mouth every 6 (six) hours as needed for pain. 20 tablet 0    BP 165/85  Pulse 94  Temp 99.1 F (37.3 C) (Oral)  Resp 18  Ht 5\' 11"  (1.803 m)  Wt 217 lb (98.431 kg)  BMI 30.27 kg/m2  SpO2 96%  Physical Exam  Constitutional: He is oriented to person, place, and time. He appears well-developed and well-nourished.  HENT:  Head: Normocephalic.  Cardiovascular: Normal rate.   Pulmonary/Chest: Effort normal.  Musculoskeletal: He exhibits edema and tenderness.  Neurological: He is alert and oriented to person, place, and time. No sensory deficit.  Skin: Skin is warm. Laceration noted. There is erythema.       5 cm laceration with sutures in place right lateral index finger with surrounding erythema and increased warmth.  Wound edges  appear moist with scant trace of purulent drainage at edges. No fluctuance.  Slight edema with erythema dorsal hand.  Patient can extend and flex his finger with minimal pain.    ED Course  Procedures (including critical care time)  Labs Reviewed  CBC WITH DIFFERENTIAL - Abnormal; Notable for the following:    WBC 12.6 (*)     Neutro Abs 8.9 (*)     Monocytes Absolute 1.2 (*)     All other components within normal limits  BASIC METABOLIC PANEL - Abnormal; Notable for the following:    Glucose, Bld 190 (*)     All other components within normal limits   No results found.   1. Laceration of finger with infection       MDM  Call placed to Dr. Izora Ribas.  Advised removing at least every other stitch, and  any over any pointed or tense areas of the infection.  IV vanc,  presciption for doxy good.  Followup with Dr Romeo Apple on Monday as originally planned from last visit.  Dr Izora Ribas can consult if Dr. Romeo Apple requests.    Pt signed out to Dr. Deretha Underwood for disposition and to arrange for suture removal.        Burgess Amor, PA 08/29/12 [phone]

## 2012-08-29 NOTE — ED Provider Notes (Signed)
Medical screening examination/treatment/procedure(s) were conducted as a shared visit with non-physician practitioner(s) and myself.  I personally evaluated the patient during the encounter  [name], MD 08/29/12 1320

## 2012-08-31 ENCOUNTER — Ambulatory Visit (INDEPENDENT_AMBULATORY_CARE_PROVIDER_SITE_OTHER): Payer: Medicare Other | Admitting: Orthopedic Surgery

## 2012-08-31 ENCOUNTER — Encounter: Payer: Self-pay | Admitting: Orthopedic Surgery

## 2012-08-31 VITALS — BP 130/80 | Ht 71.0 in | Wt 217.0 lb

## 2012-08-31 DIAGNOSIS — L03011 Cellulitis of right finger: Secondary | ICD-10-CM | POA: Insufficient documentation

## 2012-08-31 DIAGNOSIS — L03019 Cellulitis of unspecified finger: Secondary | ICD-10-CM

## 2012-08-31 DIAGNOSIS — S62309A Unspecified fracture of unspecified metacarpal bone, initial encounter for closed fracture: Secondary | ICD-10-CM

## 2012-08-31 DIAGNOSIS — S62308A Unspecified fracture of other metacarpal bone, initial encounter for closed fracture: Secondary | ICD-10-CM

## 2012-08-31 MED ORDER — HYDROCODONE-ACETAMINOPHEN 10-325 MG PO TABS: 1.0000 | ORAL_TABLET | ORAL | Status: DC | PRN

## 2012-08-31 NOTE — Progress Notes (Signed)
  Subjective:    Patient ID: Kevin Underwood, male    DOB: 08/27/1961, 51 y.o.   MRN: [id number]  Hand Injury  The incident occurred more than 1 week ago. The incident occurred in the yard. The injury mechanism was a direct blow. The pain is present in the right hand. The quality of the pain is described as aching. The pain does not radiate. The pain is moderate. The pain has been improving since the incident.    The patient's allergies are recorded, the medical and surgical history have been recorded, medications family history and social history have been recorded and all have been reviewed.   Review of Systems     Objective:   Physical Exam  Nursing note and vitals reviewed. Constitutional: He is oriented to person, place, and time. He appears well-developed and well-nourished.  HENT:  Head: Normocephalic.  Eyes: Pupils are equal, round, and reactive to light.  Neck: Normal range of motion.  Cardiovascular: Normal rate and intact distal pulses.   Pulmonary/Chest: Effort normal. No respiratory distress.  Abdominal: He exhibits no distension.  Musculoskeletal:       Left shoulder: Normal.       Right elbow: Normal.      Left elbow: Normal.       Right wrist: Normal.       Left wrist: Normal.       Right upper arm: Normal.       Right forearm: Normal.       Left forearm: Normal.       Right hand: He exhibits decreased range of motion, tenderness, bony tenderness, laceration and swelling. He exhibits normal two-point discrimination, normal capillary refill and no deformity. normal sensation noted. Decreased strength noted. He exhibits thumb/finger opposition. He exhibits no finger abduction and no wrist extension trouble.       Hands:      Lower extremity exam  Ambulation is normal.  Inspection and palpation revealed no tenderness or abnormality in alignment in the lower extremities. Range of motion is full.  Strength is grade 5.   all joints are stable.       Lymphadenopathy:    He has no cervical adenopathy.  Neurological: He is alert and oriented to person, place, and time. He has normal reflexes.  Skin: Skin is warm and dry.       RIGHT hand laceration L-shaped over the RIGHT index finger, primarily involving the proximal phalanx. There is some surrounding erythema, proximal to the PIP joint  Psychiatric: He has a normal mood and affect. His behavior is normal. Judgment and thought content normal.          Assessment & Plan:  Recommend continue oral antibiotics  Return in a week for wound check

## 2012-08-31 NOTE — Patient Instructions (Addendum)
Continue antibiotics until finished

## 2012-09-07 ENCOUNTER — Encounter: Payer: Self-pay | Admitting: Orthopedic Surgery

## 2012-09-07 ENCOUNTER — Ambulatory Visit (INDEPENDENT_AMBULATORY_CARE_PROVIDER_SITE_OTHER): Payer: Medicare Other | Admitting: Orthopedic Surgery

## 2012-09-07 VITALS — BP 144/80 | Ht 71.0 in | Wt 217.0 lb

## 2012-09-07 DIAGNOSIS — L02519 Cutaneous abscess of unspecified hand: Secondary | ICD-10-CM

## 2012-09-07 DIAGNOSIS — L03119 Cellulitis of unspecified part of limb: Secondary | ICD-10-CM

## 2012-09-07 DIAGNOSIS — L03011 Cellulitis of right finger: Secondary | ICD-10-CM

## 2012-09-07 MED ORDER — HYDROCODONE-ACETAMINOPHEN 10-325 MG PO TABS: 1.0000 | ORAL_TABLET | ORAL | Status: DC | PRN

## 2012-09-07 NOTE — Patient Instructions (Addendum)
Start warm water and epsom salt soaking 3 x a day for 15 minutes   Start IV antibiotics at hospital

## 2012-09-07 NOTE — Progress Notes (Signed)
Patient ID: Kevin Underwood, male   DOB: [birth year], 51 y.o.   MRN: [id number] Chief Complaint  Patient presents with  . Follow-up    1 week recheck on right index finger.    BP 144/80  Ht 5\' 11"  (1.803 m)  Wt 217 lb (98.431 kg)  BMI 30.27 kg/m2  Current Outpatient Prescriptions on File Prior to Visit  Medication Sig Dispense Refill  . doxycycline (VIBRAMYCIN) 100 MG capsule Take 1 capsule (100 mg total) by mouth 2 (two) times daily.  20 capsule  0  . gabapentin (NEURONTIN) 300 MG capsule Take 300 mg by mouth daily as needed. Nerve Pain      . glimepiride (AMARYL) 2 MG tablet Take 2 mg by mouth every morning.       Marland Kitchen HYDROcodone-acetaminophen (NORCO) 10-325 MG per tablet Take 1 tablet by mouth every 4 (four) hours as needed for pain.  42 tablet  0  . lisinopril (PRINIVIL,ZESTRIL) 20 MG tablet Take 20 mg by mouth daily.      . metFORMIN (GLUCOPHAGE) 500 MG tablet Take 1,000 mg by mouth 2 (two) times daily with a meal.       Laceration RIGHT index finger. Cellulitis has improved. It is now isolated over the proximal phalanx, primarily dorsally.  Recommend IV vancomycin.  Laboratory will be performed as well. Glucose has been running under 100, which is good.  Followup in one week

## 2012-09-08 ENCOUNTER — Telehealth: Payer: Self-pay | Admitting: *Deleted

## 2012-09-08 ENCOUNTER — Encounter (HOSPITAL_COMMUNITY): Payer: Medicare Other | Attending: Orthopedic Surgery

## 2012-09-08 DIAGNOSIS — L02519 Cutaneous abscess of unspecified hand: Secondary | ICD-10-CM | POA: Insufficient documentation

## 2012-09-08 DIAGNOSIS — L03011 Cellulitis of right finger: Secondary | ICD-10-CM

## 2012-09-08 DIAGNOSIS — L03019 Cellulitis of unspecified finger: Secondary | ICD-10-CM | POA: Insufficient documentation

## 2012-09-08 MED ORDER — VANCOMYCIN HCL IN DEXTROSE 1-5 GM/200ML-% IV SOLN
1000.0000 mg | INTRAVENOUS | Status: DC
  Administered 2012-09-08: 1000 mg via INTRAVENOUS
  Filled 2012-09-08 (×2): qty 200

## 2012-09-08 MED ORDER — SODIUM CHLORIDE 0.9 % IV SOLN
INTRAVENOUS | Status: DC
  Administered 2012-09-08: 13:00:00 via INTRAVENOUS

## 2012-09-08 NOTE — Progress Notes (Signed)
Infusion complete, patient tolerated well.  IV wrapped with guaze for further infusions.  Spoke with the pharmacist about dosing concerns, dosage needs to be increased to be therapeutic per pharmacist based on patient diagnosis and weight.  Dr. Mort Sawyers office called and I spoke with the nurse who stated she would send the MD a message because he was not in the office at this time.  Nurse stated she would contact us tomorrow if there were to be any changes in the order.

## 2012-09-08 NOTE — Telephone Encounter (Signed)
ok 

## 2012-09-09 ENCOUNTER — Ambulatory Visit (HOSPITAL_COMMUNITY): Payer: Medicare Other

## 2012-09-09 NOTE — Telephone Encounter (Signed)
Kevin Underwood at specialty clinic aware

## 2012-09-10 ENCOUNTER — Encounter (HOSPITAL_COMMUNITY): Payer: Medicare Other

## 2012-09-10 VITALS — BP 163/96 | HR 94 | Temp 98.0°F | Resp 20

## 2012-09-10 DIAGNOSIS — L03011 Cellulitis of right finger: Secondary | ICD-10-CM

## 2012-09-10 MED ORDER — SODIUM CHLORIDE 0.9 % IV SOLN
INTRAVENOUS | Status: DC
  Administered 2012-09-10: 10:00:00 via INTRAVENOUS

## 2012-09-10 MED ORDER — SODIUM CHLORIDE 0.9 % IJ SOLN
10.0000 mL | INTRAMUSCULAR | Status: DC | PRN
  Administered 2012-09-10: 10 mL via INTRAVENOUS

## 2012-09-10 MED ORDER — SODIUM CHLORIDE 0.9 % IV SOLN
2500.0000 mg | INTRAVENOUS | Status: DC
  Administered 2012-09-10: 2500 mg via INTRAVENOUS
  Filled 2012-09-10 (×2): qty 2500

## 2012-09-10 NOTE — Progress Notes (Signed)
Tolerated infusion well. IV site was a little pink and leaked a small amount at insertion site when flushed. Small amount of edema noted just at site. Discontinued IV. Will  Access Kevin Underwood site tomorrow.

## 2012-09-11 ENCOUNTER — Encounter (HOSPITAL_COMMUNITY): Payer: Medicare Other

## 2012-09-11 VITALS — BP 160/91 | HR 94 | Temp 98.3°F | Resp 20

## 2012-09-11 DIAGNOSIS — L03011 Cellulitis of right finger: Secondary | ICD-10-CM

## 2012-09-11 MED ORDER — SODIUM CHLORIDE 0.9 % IJ SOLN
10.0000 mL | INTRAMUSCULAR | Status: DC | PRN
  Administered 2012-09-11: 10 mL via INTRAVENOUS

## 2012-09-11 MED ORDER — SODIUM CHLORIDE 0.9 % IV SOLN
INTRAVENOUS | Status: DC
  Administered 2012-09-11: 10:00:00 via INTRAVENOUS

## 2012-09-11 MED ORDER — VANCOMYCIN HCL 1000 MG IV SOLR
2500.0000 mg | INTRAVENOUS | Status: DC
  Administered 2012-09-11: 2500 mg via INTRAVENOUS
  Filled 2012-09-11: qty 2500

## 2012-09-11 NOTE — Progress Notes (Signed)
Tolerated well

## 2012-09-12 ENCOUNTER — Encounter (HOSPITAL_COMMUNITY): Payer: Medicare Other

## 2012-09-12 VITALS — BP 158/100 | HR 85 | Temp 97.9°F | Resp 18

## 2012-09-12 DIAGNOSIS — L03011 Cellulitis of right finger: Secondary | ICD-10-CM

## 2012-09-12 MED ORDER — SODIUM CHLORIDE 0.9 % IV SOLN
Freq: Once | INTRAVENOUS | Status: AC
  Administered 2012-09-12: 09:00:00 via INTRAVENOUS

## 2012-09-12 MED ORDER — SODIUM CHLORIDE 0.9 % IJ SOLN
10.0000 mL | Freq: Once | INTRAMUSCULAR | Status: AC
  Administered 2012-09-12: 10 mL via INTRAVENOUS

## 2012-09-12 MED ORDER — HEPARIN SOD (PORK) LOCK FLUSH 100 UNIT/ML IV SOLN
300.0000 [IU] | Freq: Once | INTRAVENOUS | Status: AC
  Administered 2012-09-12: 300 [IU] via INTRAVENOUS

## 2012-09-12 MED ORDER — VANCOMYCIN HCL 1000 MG IV SOLR
2500.0000 mg | Freq: Once | INTRAVENOUS | Status: AC
  Administered 2012-09-12: 2500 mg via INTRAVENOUS
  Filled 2012-09-12: qty 2500

## 2012-09-12 NOTE — Progress Notes (Signed)
Tolerated well

## 2012-09-13 ENCOUNTER — Encounter (HOSPITAL_COMMUNITY): Payer: Medicare Other

## 2012-09-13 VITALS — BP 146/94 | HR 89 | Temp 97.8°F | Resp 18

## 2012-09-13 DIAGNOSIS — L03011 Cellulitis of right finger: Secondary | ICD-10-CM

## 2012-09-13 MED ORDER — SODIUM CHLORIDE 0.9 % IV SOLN
Freq: Once | INTRAVENOUS | Status: AC
  Administered 2012-09-13: 09:00:00 via INTRAVENOUS

## 2012-09-13 MED ORDER — SODIUM CHLORIDE 0.9 % IJ SOLN
10.0000 mL | Freq: Once | INTRAMUSCULAR | Status: AC
  Administered 2012-09-13: 10 mL via INTRAVENOUS

## 2012-09-13 MED ORDER — SODIUM CHLORIDE 0.9 % IV SOLN
2500.0000 mg | Freq: Once | INTRAVENOUS | Status: AC
  Administered 2012-09-13: 2500 mg via INTRAVENOUS
  Filled 2012-09-13: qty 2500

## 2012-09-13 MED ORDER — HEPARIN SOD (PORK) LOCK FLUSH 100 UNIT/ML IV SOLN
500.0000 [IU] | Freq: Once | INTRAVENOUS | Status: AC
  Administered 2012-09-13: 500 [IU] via INTRAVENOUS

## 2012-09-13 NOTE — Progress Notes (Signed)
Tolerated well. Patient is planning on contacting Dr. Romeo Apple on 09/14/2012 re condition of his finger. Does not appear to be improving on daily vanco. Pt is unable to come for twice a day vanco due to transportation problems.

## 2012-09-14 ENCOUNTER — Encounter (HOSPITAL_COMMUNITY): Payer: Medicare Other

## 2012-09-14 ENCOUNTER — Telehealth: Payer: Self-pay | Admitting: Orthopedic Surgery

## 2012-09-14 ENCOUNTER — Encounter: Payer: Self-pay | Admitting: Orthopedic Surgery

## 2012-09-14 ENCOUNTER — Encounter (HOSPITAL_COMMUNITY): Payer: Self-pay

## 2012-09-14 ENCOUNTER — Ambulatory Visit (INDEPENDENT_AMBULATORY_CARE_PROVIDER_SITE_OTHER): Payer: Medicare Other | Admitting: Orthopedic Surgery

## 2012-09-14 VITALS — BP 120/70 | Ht 71.0 in | Wt 217.0 lb

## 2012-09-14 DIAGNOSIS — L03011 Cellulitis of right finger: Secondary | ICD-10-CM

## 2012-09-14 DIAGNOSIS — L03019 Cellulitis of unspecified finger: Secondary | ICD-10-CM

## 2012-09-14 MED ORDER — VANCOMYCIN HCL 1000 MG IV SOLR
2500.0000 mg | INTRAVENOUS | Status: DC
  Administered 2012-09-14: 2500 mg via INTRAVENOUS
  Filled 2012-09-14 (×2): qty 2500

## 2012-09-14 MED ORDER — SODIUM CHLORIDE 0.9 % IV SOLN
INTRAVENOUS | Status: DC
  Administered 2012-09-14: 11:00:00 via INTRAVENOUS

## 2012-09-14 MED ORDER — SODIUM CHLORIDE 0.9 % IJ SOLN
10.0000 mL | INTRAMUSCULAR | Status: DC | PRN
  Administered 2012-09-14: 10 mL via INTRAVENOUS

## 2012-09-14 MED ORDER — HYDROCODONE-ACETAMINOPHEN 10-325 MG PO TABS: 1.0000 | ORAL_TABLET | ORAL | Status: DC | PRN

## 2012-09-14 NOTE — Patient Instructions (Addendum)
You have been scheduled for surgery.  INCISION AND DRAINAGE RIGHT INDEX FINGER. All surgeries carry some risk.  Remember you always have the option of continued nonsurgical treatment. However in this situation the risks vs. the benefits favor surgery as the best treatment option. The risks of the surgery includes the following but is not limited to bleeding, infection,  nerve injury vascular injury or need for further surgery, continued pain.  Specific to this procedure the following risks and complications are rare but possible Stiffness Pain  Tendon injury

## 2012-09-14 NOTE — Progress Notes (Signed)
Infusion complete, patient tolerated well.  IV wrapped in guaze for future infusions.

## 2012-09-14 NOTE — Telephone Encounter (Addendum)
Per Medicare (primary insurer) guidelines, no pre-authorization required for surgery, out-patient procedure scheduled 09/15/12 at [facility].  CPT:  10060, ICD9 681.00 Per Medicaid, as secondary, no pre-authorization required. Verified through [facility], [place] online system, provider portal reply: "N", no pre-approval is required, per DHSS guidelines.

## 2012-09-14 NOTE — Progress Notes (Signed)
Patient ID: Kevin Underwood, male   DOB: 1961-04-01, 51 y.o.   MRN: [id number] Chief Complaint  Patient presents with  . Follow-up    recheck cellulitis finger right hand    The patient has had 2 oral antibiotics including doxycycline and IV vancomycin and has residual cellulitis and pain over the dorsal and volar aspect of the proximal phalanx of the right index finger  At this point I have recommended that he have incision and drainage and culture to get a better handle on what type of bacteria is causing the residual cellulitis.  Full history and physical to be dictated and is incorporated by reference  Decision for surgery today surgery tomorrow

## 2012-09-14 NOTE — Telephone Encounter (Signed)
Faxed office notes including planned surgery date tomorrow, 09/15/12, to primary care physician, Dr. Kirstie Peri at fax [phone]

## 2012-09-15 ENCOUNTER — Ambulatory Visit (HOSPITAL_COMMUNITY): Payer: Medicare Other | Admitting: Anesthesiology

## 2012-09-15 ENCOUNTER — Encounter (HOSPITAL_COMMUNITY): Payer: Self-pay | Admitting: *Deleted

## 2012-09-15 ENCOUNTER — Encounter (HOSPITAL_COMMUNITY)
Admission: RE | Disposition: A | Discharge status: Home / Self Care | Payer: Self-pay | Source: Ambulatory Visit | Attending: Orthopedic Surgery

## 2012-09-15 ENCOUNTER — Ambulatory Visit (HOSPITAL_COMMUNITY)
Admission: RE | Admit: 2012-09-15 | Discharge: 2012-09-15 | Disposition: A | Discharge status: Home / Self Care | Payer: Medicare Other | Source: Ambulatory Visit | Attending: Orthopedic Surgery | Admitting: Orthopedic Surgery

## 2012-09-15 ENCOUNTER — Encounter (HOSPITAL_COMMUNITY): Payer: Self-pay | Admitting: Anesthesiology

## 2012-09-15 ENCOUNTER — Ambulatory Visit (HOSPITAL_COMMUNITY): Payer: Medicare Other

## 2012-09-15 DIAGNOSIS — I10 Essential (primary) hypertension: Secondary | ICD-10-CM | POA: Insufficient documentation

## 2012-09-15 DIAGNOSIS — Z79899 Other long term (current) drug therapy: Secondary | ICD-10-CM | POA: Insufficient documentation

## 2012-09-15 DIAGNOSIS — E119 Type 2 diabetes mellitus without complications: Secondary | ICD-10-CM | POA: Insufficient documentation

## 2012-09-15 DIAGNOSIS — J449 Chronic obstructive pulmonary disease, unspecified: Secondary | ICD-10-CM | POA: Insufficient documentation

## 2012-09-15 DIAGNOSIS — L02519 Cutaneous abscess of unspecified hand: Secondary | ICD-10-CM | POA: Insufficient documentation

## 2012-09-15 DIAGNOSIS — J4489 Other specified chronic obstructive pulmonary disease: Secondary | ICD-10-CM | POA: Insufficient documentation

## 2012-09-15 DIAGNOSIS — L03011 Cellulitis of right finger: Secondary | ICD-10-CM

## 2012-09-15 DIAGNOSIS — L03019 Cellulitis of unspecified finger: Secondary | ICD-10-CM | POA: Insufficient documentation

## 2012-09-15 HISTORY — DX: Unspecified osteoarthritis, unspecified site: M19.90

## 2012-09-15 LAB — BASIC METABOLIC PANEL
CO2: 29 mEq/L (ref 19–32)
Chloride: 100 mEq/L (ref 96–112)
Creatinine, Ser: 0.69 mg/dL (ref 0.50–1.35)
GFR calc Af Amer: >90 mL/min (ref >90)
Potassium: 3.5 mEq/L (ref 3.5–5.1)
Sodium: 138 mEq/L (ref 135–145)

## 2012-09-15 LAB — CBC
HCT: 42.9 % (ref 39.0–52.0)
Hemoglobin: 15.5 g/dL (ref 13.0–17.0)
RBC: 5.1 MIL/uL (ref 4.22–5.81)
WBC: 9.5 10*3/uL (ref 4.0–10.5)

## 2012-09-15 LAB — SURGICAL PCR SCREEN
MRSA, PCR: NEGATIVE
Staphylococcus aureus: NEGATIVE

## 2012-09-15 SURGERY — INCISION AND DRAINAGE
Anesthesia: General | Site: Finger | Laterality: Right | Wound class: Clean

## 2012-09-15 MED ORDER — CHLORHEXIDINE GLUCONATE 4 % EX LIQD: 60.0000 mL | Freq: Once | CUTANEOUS | Status: DC

## 2012-09-15 MED ORDER — SODIUM CHLORIDE 0.9 % IR SOLN
Status: DC | PRN
  Administered 2012-09-15: 1000 mL

## 2012-09-15 MED ORDER — ONDANSETRON HCL 4 MG/2ML IJ SOLN: 4.0000 mg | Freq: Once | INTRAMUSCULAR | Status: DC

## 2012-09-15 MED ORDER — FENTANYL CITRATE 0.05 MG/ML IJ SOLN
INTRAMUSCULAR | Status: AC
  Filled 2012-09-15: qty 2

## 2012-09-15 MED ORDER — MIDAZOLAM HCL 2 MG/2ML IJ SOLN
INTRAMUSCULAR | Status: AC
  Filled 2012-09-15: qty 2

## 2012-09-15 MED ORDER — PROPOFOL 10 MG/ML IV BOLUS
INTRAVENOUS | Status: DC | PRN
  Administered 2012-09-15: 40 mg via INTRAVENOUS
  Administered 2012-09-15: 160 mg via INTRAVENOUS

## 2012-09-15 MED ORDER — MIDAZOLAM HCL 2 MG/2ML IJ SOLN
1.0000 mg | INTRAMUSCULAR | Status: DC | PRN
  Administered 2012-09-15 (×2): 2 mg via INTRAVENOUS

## 2012-09-15 MED ORDER — BUPIVACAINE HCL (PF) 0.5 % IJ SOLN
INTRAMUSCULAR | Status: DC | PRN
  Administered 2012-09-15: 20 mL

## 2012-09-15 MED ORDER — FENTANYL CITRATE 0.05 MG/ML IJ SOLN
25.0000 ug | INTRAMUSCULAR | Status: DC | PRN
  Administered 2012-09-15 (×2): 50 ug via INTRAVENOUS

## 2012-09-15 MED ORDER — BUPIVACAINE HCL (PF) 0.5 % IJ SOLN
INTRAMUSCULAR | Status: AC
  Filled 2012-09-15: qty 30

## 2012-09-15 MED ORDER — HEMOSTATIC AGENTS (NO CHARGE) OPTIME
TOPICAL | Status: DC | PRN
  Administered 2012-09-15: 1 via TOPICAL

## 2012-09-15 MED ORDER — ONDANSETRON HCL 4 MG/2ML IJ SOLN: 4.0000 mg | Freq: Once | INTRAMUSCULAR | Status: DC | PRN

## 2012-09-15 MED ORDER — PROPOFOL 10 MG/ML IV EMUL
INTRAVENOUS | Status: AC
  Filled 2012-09-15: qty 20

## 2012-09-15 MED ORDER — CEFAZOLIN SODIUM-DEXTROSE 2-3 GM-% IV SOLR
INTRAVENOUS | Status: AC
  Filled 2012-09-15: qty 50

## 2012-09-15 MED ORDER — LIDOCAINE HCL (CARDIAC) 10 MG/ML IV SOLN
INTRAVENOUS | Status: DC | PRN
  Administered 2012-09-15: 20 mg via INTRAVENOUS

## 2012-09-15 MED ORDER — FENTANYL CITRATE 0.05 MG/ML IJ SOLN
INTRAMUSCULAR | Status: DC | PRN
  Administered 2012-09-15 (×2): 50 ug via INTRAVENOUS
  Administered 2012-09-15: 100 ug via INTRAVENOUS

## 2012-09-15 MED ORDER — CEFAZOLIN SODIUM-DEXTROSE 2-3 GM-% IV SOLR
2.0000 g | INTRAVENOUS | Status: AC
  Administered 2012-09-15: 2 g via INTRAVENOUS

## 2012-09-15 MED ORDER — LIDOCAINE HCL (PF) 1 % IJ SOLN
INTRAMUSCULAR | Status: AC
  Filled 2012-09-15: qty 5

## 2012-09-15 MED ORDER — LACTATED RINGERS IV SOLN
INTRAVENOUS | Status: DC
  Administered 2012-09-15: 1000 mL via INTRAVENOUS

## 2012-09-15 SURGICAL SUPPLY — 34 items
BAG HAMPER (MISCELLANEOUS) ×2 IMPLANT
BANDAGE CONFORM 2  STR LF (GAUZE/BANDAGES/DRESSINGS) ×2 IMPLANT
BANDAGE ELASTIC 2 VELCRO NS LF (GAUZE/BANDAGES/DRESSINGS) ×2 IMPLANT
BANDAGE ELASTIC 6 VELCRO ST LF (GAUZE/BANDAGES/DRESSINGS) IMPLANT
CLOTH BEACON ORANGE TIMEOUT ST (SAFETY) ×2 IMPLANT
COVER LIGHT HANDLE STERIS (MISCELLANEOUS) ×4 IMPLANT
ELECT REM PT RETURN 9FT ADLT (ELECTROSURGICAL) ×2
ELECTRODE REM PT RTRN 9FT ADLT (ELECTROSURGICAL) ×1 IMPLANT
GLOVE BIOGEL PI IND STRL 7.0 (GLOVE) ×2 IMPLANT
GLOVE BIOGEL PI INDICATOR 7.0 (GLOVE) ×2
GLOVE EXAM NITRILE MD LF STRL (GLOVE) ×2 IMPLANT
GLOVE SKINSENSE NS SZ8.0 LF (GLOVE) ×1
GLOVE SKINSENSE STRL SZ8.0 LF (GLOVE) ×1 IMPLANT
GLOVE SS BIOGEL STRL SZ 6.5 (GLOVE) ×2 IMPLANT
GLOVE SS N UNI LF 8.5 STRL (GLOVE) ×2 IMPLANT
GLOVE SUPERSENSE BIOGEL SZ 6.5 (GLOVE) ×2
GOWN STRL REIN XL XLG (GOWN DISPOSABLE) ×6 IMPLANT
HEMOSTAT SURGICEL 4X8 (HEMOSTASIS) ×2 IMPLANT
KIT ROOM TURNOVER APOR (KITS) ×2 IMPLANT
MANIFOLD NEPTUNE II (INSTRUMENTS) ×2 IMPLANT
NS IRRIG 1000ML POUR BTL (IV SOLUTION) ×2 IMPLANT
PACK BASIC LIMB (CUSTOM PROCEDURE TRAY) IMPLANT
PAD ABD 5X9 TENDERSORB (GAUZE/BANDAGES/DRESSINGS) IMPLANT
PAD ARMBOARD 7.5X6 YLW CONV (MISCELLANEOUS) ×2 IMPLANT
PADDING CAST COTTON 6X4 STRL (CAST SUPPLIES) IMPLANT
SET BASIN LINEN APH (SET/KITS/TRAYS/PACK) ×2 IMPLANT
SPONGE GAUZE 2X2 8PLY STRL LF (GAUZE/BANDAGES/DRESSINGS) ×2 IMPLANT
SPONGE GAUZE 4X4 12PLY (GAUZE/BANDAGES/DRESSINGS) IMPLANT
SUT ETHILON 3 0 FSL (SUTURE) ×2 IMPLANT
SWAB CULTURE LIQ STUART DBL (MISCELLANEOUS) ×2 IMPLANT
SYR 20CC LL (SYRINGE) ×2 IMPLANT
SYR BULB IRRIGATION 50ML (SYRINGE) ×2 IMPLANT
SYRINGE 10CC LL (SYRINGE) ×2 IMPLANT
VESSEL LOOPS MAXI RED (MISCELLANEOUS) ×2 IMPLANT

## 2012-09-15 NOTE — Anesthesia Procedure Notes (Signed)
Procedure Name: LMA Insertion Date/Time: 09/15/2012 1:11 PM Performed by: Franco Nones Pre-anesthesia Checklist: Patient identified, Patient being monitored, Emergency Drugs available, Timeout performed and Suction available Patient Re-evaluated:Patient Re-evaluated prior to inductionOxygen Delivery Method: Circle System Utilized Preoxygenation: Pre-oxygenation with 100% oxygen Intubation Type: IV induction Ventilation: Mask ventilation without difficulty LMA: LMA inserted LMA Size: 4.0 Number of attempts: 1 Placement Confirmation: positive ETCO2 and breath sounds checked- equal and bilateral

## 2012-09-15 NOTE — Brief Op Note (Signed)
09/15/2012  1:45 PM  PATIENT:  Kevin Underwood  51 y.o. male  PRE-OPERATIVE DIAGNOSIS:  cellulitis right index finger  POST-OPERATIVE DIAGNOSIS:  cellulitis right index finger  PROCEDURE:  Procedure(s) (LRB) with comments: INCISION AND DRAINAGE (Right) - index finger  FINDINGS: NO PURULENCE   SURGEON:  Surgeon(s) and Role:    * Vickki Hearing, MD - Primary  PHYSICIAN ASSISTANT:   ASSISTANTS: none   ANESTHESIA:   general  EBL:  Total I/O In: 600 [I.V.:600] Out: 10 [Blood:10]  BLOOD ADMINISTERED:none  DRAINS: none   LOCAL MEDICATIONS USED:  MARCAINE     SPECIMEN:  Source of Specimen:  culture right index finger   DISPOSITION OF SPECIMEN:  microbiology   COUNTS:  YES  TOURNIQUET:  * Missing tourniquet times found for documented tourniquets in log:  54098 *  DICTATION: .Dragon Dictation  PLAN OF CARE: Discharge to home after PACU  PATIENT DISPOSITION:  PACU - hemodynamically stable.   Delay start of Pharmacological VTE agent (>24hrs) due to surgical blood loss or risk of bleeding: not applicable

## 2012-09-15 NOTE — Anesthesia Preprocedure Evaluation (Signed)
Anesthesia Evaluation  Patient identified by MRN, date of birth, ID band Patient awake    Reviewed: Allergy & Precautions, H&P , NPO status , Patient's Chart, lab work & pertinent test results  History of Anesthesia Complications Negative for: history of anesthetic complications  Airway Mallampati: II TM Distance: >3 FB     Dental  (+) Teeth Intact   Pulmonary COPDCurrent Smoker,          Cardiovascular hypertension, Pt. on medications Rhythm:Regular     Neuro/Psych    GI/Hepatic negative GI ROS,   Endo/Other  diabetes, Type 2, Oral Hypoglycemic Agents  Renal/GU      Musculoskeletal   Abdominal   Peds  Hematology   Anesthesia Other Findings   Reproductive/Obstetrics                           Anesthesia Physical Anesthesia Plan  ASA: III  Anesthesia Plan: General   Post-op Pain Management:    Induction: Intravenous  Airway Management Planned: LMA  Additional Equipment:   Intra-op Plan:   Post-operative Plan: Extubation in OR  Informed Consent: I have reviewed the patients History and Physical, chart, labs and discussed the procedure including the risks, benefits and alternatives for the proposed anesthesia with the patient or authorized representative who has indicated his/her understanding and acceptance.     Plan Discussed with:   Anesthesia Plan Comments:         Anesthesia Quick Evaluation

## 2012-09-15 NOTE — H&P (Signed)
Kevin Underwood is an 51 y.o. male.   Chief Complaint: pain right index finger  HPI: 51 year old male laceration right index finger initially treated in the emergency room with irrigation and closure he presented back to the emergency room with pain and swelling was treated with. Oral antibiotics; he presented to the office with cellulitis his antibiotics were changed he took those and about for a week improved slightly but cellulitis is not resolved was started on IV vancomycin still did not improve and complained of pain and swelling over the right index finger proximal phalanx on both the volar and dorsal side.  Past Medical History  Diagnosis Date  . COPD (chronic obstructive pulmonary disease)   . Hypertension   . Diabetes mellitus   . Arthritis     Past Surgical History  Procedure Date  . Back surgery   . Foot surgery   . Hand surgery     Family History  Problem Relation Age of Onset  . Arthritis    . Lung disease    . Asthma    . Diabetes     Social History:  reports that he has been smoking Cigarettes.  He has a 30 pack-year smoking history. He does not have any smokeless tobacco history on file. He reports that he drinks alcohol. He reports that he does not use illicit drugs.  Allergies: No Known Allergies  Medications Prior to Admission  Medication Sig Dispense Refill  . doxycycline (VIBRAMYCIN) 100 MG capsule Take 1 capsule (100 mg total) by mouth 2 (two) times daily.  20 capsule  0  . gabapentin (NEURONTIN) 300 MG capsule Take 300 mg by mouth daily.       Marland Kitchen glimepiride (AMARYL) 2 MG tablet Take 2 mg by mouth daily.       Marland Kitchen HYDROcodone-acetaminophen (NORCO) 10-325 MG per tablet Take 1 tablet by mouth every 4 (four) hours as needed for pain.  42 tablet  0  . lisinopril (PRINIVIL,ZESTRIL) 20 MG tablet Take 20 mg by mouth daily.      . metFORMIN (GLUCOPHAGE) 500 MG tablet Take 1,000 mg by mouth 2 (two) times daily with a meal.        Results for orders placed during  the hospital encounter of 09/15/12 (from the past 48 hour(s))  CBC     Status: Abnormal   Collection Time   09/15/12 11:05 AM      Component Value Range Comment   WBC 9.5  4.0 - 10.5 K/uL    RBC 5.10  4.22 - 5.81 MIL/uL    Hemoglobin 15.5  13.0 - 17.0 g/dL    HCT 45.4  09.8 - 11.9 %    MCV 84.1  78.0 - 100.0 fL    MCH 30.4  26.0 - 34.0 pg    MCHC 36.1 (*) 30.0 - 36.0 g/dL    RDW 14.7  82.9 - 56.2 %    Platelets 257  150 - 400 K/uL   SURGICAL PCR SCREEN     Status: Normal   Collection Time   09/15/12 11:05 AM      Component Value Range Comment   MRSA, PCR NEGATIVE  NEGATIVE    Staphylococcus aureus NEGATIVE  NEGATIVE   BASIC METABOLIC PANEL     Status: Abnormal   Collection Time   09/15/12 11:05 AM      Component Value Range Comment   Sodium 138  135 - 145 mEq/L    Potassium 3.5  3.5 - 5.1  mEq/L    Chloride 100  96 - 112 mEq/L    CO2 29  19 - 32 mEq/L    Glucose, Bld 131 (*) 70 - 99 mg/dL    BUN 15  6 - 23 mg/dL    Creatinine, Ser 1.61  0.50 - 1.35 mg/dL    Calcium 9.8  8.4 - 09.6 mg/dL    GFR calc non Af Amer >90  >90 mL/min    GFR calc Af Amer >90  >90 mL/min    No results found.  Review of Systems  All other systems reviewed and are negative.    Blood pressure 126/82, pulse 88, temperature 97.5 F (36.4 C), temperature source Oral, resp. rate 22, height 5\' 11"  (1.803 m), weight 97.523 kg (215 lb), SpO2 95.00%. Physical Exam  Nursing note and vitals reviewed. Constitutional: He is oriented to person, place, and time. He appears well-developed and well-nourished.  HENT:  Head: Normocephalic.  Eyes: Pupils are equal, round, and reactive to light.  Neck: Normal range of motion.  Cardiovascular: Normal rate and intact distal pulses.   Respiratory: No respiratory distress.  GI: He exhibits no distension.  Musculoskeletal:       Lower extremity exam  Ambulation is normal.  Inspection and palpation revealed no tenderness or abnormality in alignment in the lower  extremities. Range of motion is full.  Strength is grade 5.   all joints are stable.   His right index finger has erythema over the dorsal aspect and tenderness and pain in the dorsal and volar aspects of the joint is laceration and an L-shaped pattern which goes over the PIP joint and then on the radial side of the digit. He has decreased range of motion but no tendon laceration or weakness. There is no instability of the joint there is no sign of joint involvement.  Lymphadenopathy:    He has no cervical adenopathy.  Neurological: He is alert and oriented to person, place, and time. He has normal reflexes.  Skin: Skin is warm and dry. He is not diaphoretic. There is erythema.  Psychiatric: He has a normal mood and affect. His behavior is normal. Judgment and thought content normal.     Assessment/Plan Cellulitis of the right index finger refractory to oral and IV antibiotics.  The plan is for irrigation debridement and culture of the wound to get a better handle on the bacteria which is causing the persistent cellulitis.  The patient has been informed of his risks and benefits of surgery versus nonsurgical treatment and agrees to proceed with surgery  Fuller Canada 09/15/2012, 12:52 PM

## 2012-09-15 NOTE — Transfer of Care (Signed)
Immediate Anesthesia Transfer of Care Note  Patient: Kevin Underwood  Procedure(s) Performed: Procedure(s) (LRB): INCISION AND DRAINAGE (Right)  Patient Location: PACU  Anesthesia Type: General  Level of Consciousness: awake  Airway & Oxygen Therapy: Patient Spontanous Breathing and non-rebreather face mask  Post-op Assessment: Report given to PACU RN, Post -op Vital signs reviewed and stable and Patient moving all extremities  Post vital signs: Reviewed and stable  Complications: No apparent anesthesia complications

## 2012-09-15 NOTE — Op Note (Signed)
Diagnosis cellulitis, RIGHT index finger.  Postoperative diagnosis same.  Procedure incision and drainage with culture, RIGHT index finger.  Surgeon Romeo Apple, no assistant. Anesthesia Gen.  Date of surgery September 15, 2012  Operative findings cellulitis, no purulence. No evidence of tendon injury, nerve injury.  Details of procedure after site marked in confirmation of surgical site. The chart was reviewed and the patient was taken to the operating room for general anesthesia  [place] and prepped and draped with Betadine in.  After timeout execution, the previous laceration was opened over sharp dissection, followed by blunt dissection, down to the metacarpophalangeal joint volarly and dorsally. There is no purulent material.  The wound was irrigated with copious amounts of saline and then closed with 3-0 nylon over a red, rubber  catheter drain.  Sterile dressing was applied. No tourniquet was used.  The patient was taken to the recovery room in stable condition after extubation

## 2012-09-15 NOTE — Anesthesia Postprocedure Evaluation (Signed)
Anesthesia Post Note  Patient: Kevin Underwood  Procedure(s) Performed: Procedure(s) (LRB): INCISION AND DRAINAGE (Right)  Anesthesia type: General  Patient location: PACU  Post pain: Pain level controlled  Post assessment: Post-op Vital signs reviewed, Patient's Cardiovascular Status Stable, Respiratory Function Stable, Patent Airway, No signs of Nausea or vomiting and Pain level controlled  Last Vitals:  Filed Vitals:   09/15/12 1357  BP: 126/78  Pulse: 84  Temp: 36.5 C  Resp: 16    Post vital signs: Reviewed and stable  Level of consciousness: awake and alert   Complications: No apparent anesthesia complications

## 2012-09-15 NOTE — Interval H&P Note (Signed)
History and Physical Interval Note:  09/15/2012 12:57 PM  Kevin Underwood  has presented today for surgery, with the diagnosis of cellulitis right index finger  The various methods of treatment have been discussed with the patient and family. After consideration of risks, benefits and other options for treatment, the patient has consented to  Procedure(s) (LRB) with comments: INCISION AND DRAINAGE (Right) - index finger as a surgical intervention .  The patient's history has been reviewed, patient examined, no change in status, stable for surgery.  I have reviewed the patient's chart and labs.  Questions were answered to the patient's satisfaction.     Fuller Canada

## 2012-09-17 ENCOUNTER — Ambulatory Visit (INDEPENDENT_AMBULATORY_CARE_PROVIDER_SITE_OTHER): Payer: Medicare Other | Admitting: Orthopedic Surgery

## 2012-09-17 ENCOUNTER — Encounter: Payer: Self-pay | Admitting: Orthopedic Surgery

## 2012-09-17 VITALS — BP 120/80 | Ht 71.0 in | Wt 217.0 lb

## 2012-09-17 DIAGNOSIS — L03011 Cellulitis of right finger: Secondary | ICD-10-CM

## 2012-09-17 DIAGNOSIS — L03019 Cellulitis of unspecified finger: Secondary | ICD-10-CM

## 2012-09-17 DIAGNOSIS — L02519 Cutaneous abscess of unspecified hand: Secondary | ICD-10-CM

## 2012-09-17 MED ORDER — DOXYCYCLINE HYCLATE 100 MG PO CAPS: 100.0000 mg | ORAL_CAPSULE | Freq: Two times a day (BID) | ORAL | Status: DC

## 2012-09-17 MED ORDER — HYDROCODONE-ACETAMINOPHEN 10-325 MG PO TABS: 1.0000 | ORAL_TABLET | ORAL | Status: DC | PRN

## 2012-09-17 NOTE — Progress Notes (Signed)
Patient ID: Kevin Underwood, male   DOB: 1961/01/22, 51 y.o.   MRN: [id number] Chief Complaint  Patient presents with  . Follow-up    Post op #1, right index finger.    Status post incision and drainage right index finger no purulence found initial cultures negative so far  Start wound care continue oral antibiotics and Norco for pain

## 2012-09-17 NOTE — Patient Instructions (Addendum)
Start wound care at hospital

## 2012-09-18 LAB — WOUND CULTURE: Gram Stain: NONE SEEN

## 2012-09-20 LAB — ANAEROBIC CULTURE

## 2012-09-24 ENCOUNTER — Encounter: Payer: Self-pay | Admitting: Orthopedic Surgery

## 2012-09-24 ENCOUNTER — Ambulatory Visit (INDEPENDENT_AMBULATORY_CARE_PROVIDER_SITE_OTHER): Payer: Medicare Other | Admitting: Orthopedic Surgery

## 2012-09-24 VITALS — BP 130/70 | Ht 71.0 in | Wt 217.0 lb

## 2012-09-24 DIAGNOSIS — L03011 Cellulitis of right finger: Secondary | ICD-10-CM

## 2012-09-24 DIAGNOSIS — L03019 Cellulitis of unspecified finger: Secondary | ICD-10-CM

## 2012-09-24 MED ORDER — HYDROCODONE-ACETAMINOPHEN 10-325 MG PO TABS: 1.0000 | ORAL_TABLET | ORAL | Status: DC | PRN

## 2012-09-24 NOTE — Addendum Note (Signed)
Addended by: Adella Hare B on: 09/24/2012 09:15 AM   Modules accepted: Orders

## 2012-09-24 NOTE — Patient Instructions (Signed)
Continue rehab 

## 2012-09-24 NOTE — Progress Notes (Signed)
Patient ID: Kevin Underwood, male   DOB: [birth year], 51 y.o.   MRN: [id number] Chief Complaint  Patient presents with  . Follow-up    one week recheck finger, post op 2, DOS     Culture negative   Stiff PIP joint   Sutures out   Continue OT  Follow 2 weeks

## 2012-09-30 ENCOUNTER — Ambulatory Visit (HOSPITAL_COMMUNITY): Payer: Medicare Other | Admitting: Specialist

## 2012-10-08 ENCOUNTER — Encounter: Payer: Self-pay | Admitting: Orthopedic Surgery

## 2012-10-08 ENCOUNTER — Ambulatory Visit (INDEPENDENT_AMBULATORY_CARE_PROVIDER_SITE_OTHER): Payer: Medicare Other | Admitting: Orthopedic Surgery

## 2012-10-08 VITALS — BP 124/82 | Ht 71.0 in | Wt 217.0 lb

## 2012-10-08 DIAGNOSIS — S62308A Unspecified fracture of other metacarpal bone, initial encounter for closed fracture: Secondary | ICD-10-CM

## 2012-10-08 DIAGNOSIS — S62309A Unspecified fracture of unspecified metacarpal bone, initial encounter for closed fracture: Secondary | ICD-10-CM

## 2012-10-08 NOTE — Patient Instructions (Signed)
Continue OT

## 2012-10-08 NOTE — Progress Notes (Signed)
Patient ID: Kevin Underwood, male   DOB: [birth year], 51 y.o.   MRN: [id number] BP 124/82  Ht 5\' 11"  (1.803 m)  Wt 217 lb (98.431 kg)  BMI 30.27 kg/m2 Chief Complaint  Patient presents with  . Follow-up    recheck finger right hand, DOI  09/15/12     Status post incision and drainage of index finger with no evidence of infection by culture  He still has some redness and erythema around the incision some pain on the side of the finger no numbness or tingling  Recommend continue occupational therapy to regain full flexion of the interphalangeal joint and the metacarpal phalangeal joint  Followup in 3 weeks continue current pain medication call if refills are needed

## 2012-10-26 ENCOUNTER — Other Ambulatory Visit: Payer: Self-pay | Admitting: Orthopedic Surgery

## 2012-10-26 DIAGNOSIS — S62329A Displaced fracture of shaft of unspecified metacarpal bone, initial encounter for closed fracture: Secondary | ICD-10-CM

## 2012-10-26 MED ORDER — HYDROCODONE-ACETAMINOPHEN 7.5-325 MG PO TABS: 1.0000 | ORAL_TABLET | ORAL | Status: DC | PRN

## 2012-10-29 ENCOUNTER — Ambulatory Visit: Payer: Medicare Other | Admitting: Orthopedic Surgery

## 2012-11-11 ENCOUNTER — Ambulatory Visit: Payer: Medicare Other | Admitting: Orthopedic Surgery

## 2015-11-28 ENCOUNTER — Emergency Department (HOSPITAL_COMMUNITY)
Admission: EM | Admit: 2015-11-28 | Discharge: 2015-11-28 | Disposition: A | Discharge status: Home / Self Care | Payer: Medicare Other | Attending: Emergency Medicine | Admitting: Emergency Medicine

## 2015-11-28 ENCOUNTER — Encounter (HOSPITAL_COMMUNITY): Payer: Self-pay | Admitting: Emergency Medicine

## 2015-11-28 DIAGNOSIS — Z79899 Other long term (current) drug therapy: Secondary | ICD-10-CM | POA: Diagnosis not present

## 2015-11-28 DIAGNOSIS — F17210 Nicotine dependence, cigarettes, uncomplicated: Secondary | ICD-10-CM | POA: Insufficient documentation

## 2015-11-28 DIAGNOSIS — I10 Essential (primary) hypertension: Secondary | ICD-10-CM | POA: Diagnosis not present

## 2015-11-28 DIAGNOSIS — E119 Type 2 diabetes mellitus without complications: Secondary | ICD-10-CM | POA: Insufficient documentation

## 2015-11-28 DIAGNOSIS — M1990 Unspecified osteoarthritis, unspecified site: Secondary | ICD-10-CM | POA: Insufficient documentation

## 2015-11-28 DIAGNOSIS — J441 Chronic obstructive pulmonary disease with (acute) exacerbation: Secondary | ICD-10-CM | POA: Insufficient documentation

## 2015-11-28 DIAGNOSIS — Z7982 Long term (current) use of aspirin: Secondary | ICD-10-CM | POA: Diagnosis not present

## 2015-11-28 DIAGNOSIS — F1410 Cocaine abuse, uncomplicated: Secondary | ICD-10-CM | POA: Diagnosis not present

## 2015-11-28 DIAGNOSIS — Z7984 Long term (current) use of oral hypoglycemic drugs: Secondary | ICD-10-CM | POA: Diagnosis not present

## 2015-11-28 NOTE — ED Notes (Signed)
Here for detox for smoking cocaine.  Been on cocaine for over 20 years. Last used was last night.

## 2015-11-28 NOTE — ED Provider Notes (Signed)
CSN: [id number]     Arrival date & time 11/28/15  1700 History   First MD Initiated Contact with Patient 11/28/15 1025     Chief Complaint  Patient presents with  . Drug Problem     (Consider location/radiation/quality/duration/timing/severity/associated sxs/prior Treatment) The history is provided by the patient.   Kevin Underwood is a 54 y.o. male presenting for assistance with detox from cocaine.  He describes a 15-20 year history of smoking cocaine,  Last use was yesterday evening.  He is asking for inpatient treatment for this.  He was seen by his pcp for this several months ago who tried to get [facility] into [facility] but he was denied for insurance reasons and for unclear reasons did not pursue any other facilities for placement.  He denies etoh or other substance abuse.  Past medical history is significant for copd, DM and HTN.  He denies chest pain or any increase in sob beyond his baseline.  He also denies suicidal or homicidal ideation.     Past Medical History  Diagnosis Date  . COPD (chronic obstructive pulmonary disease) (HCC)   . Hypertension   . Diabetes mellitus   . Arthritis    Past Surgical History  Procedure Laterality Date  . Back surgery    . Foot surgery    . Hand surgery     Family History  Problem Relation Age of Onset  . Arthritis    . Lung disease    . Asthma    . Diabetes     Social History  Substance Use Topics  . Smoking status: Current Every Day Smoker -- 1.00 packs/day for 30 years    Types: Cigarettes  . Smokeless tobacco: None  . Alcohol Use: Yes     Comment: once a week    Review of Systems  Constitutional: Negative for fever.  HENT: Negative for congestion and sore throat.   Eyes: Negative.   Respiratory: Negative for chest tightness and shortness of breath.   Cardiovascular: Negative for chest pain.  Gastrointestinal: Negative for nausea and abdominal pain.  Genitourinary: Negative.   Musculoskeletal: Negative for joint  swelling, arthralgias and neck pain.  Skin: Negative.  Negative for rash and wound.  Neurological: Negative for dizziness, weakness, light-headedness, numbness and headaches.  Psychiatric/Behavioral: Negative.       Allergies  Review of patient's allergies indicates no known allergies.  Home Medications   Prior to Admission medications   Medication Sig Start Date End Date Taking? Authorizing Provider  ADVAIR DISKUS 250-50 MCG/DOSE AEPB Inhale 2 puffs into the lungs 2 (two) times daily. 09/14/15  Yes Historical Provider, MD  albuterol (PROVENTIL) (2.5 MG/3ML) 0.083% nebulizer solution Inhale 1 mL into the lungs 4 (four) times daily as needed. 10/17/15  Yes Historical Provider, MD  aspirin 81 MG tablet Take 81 mg by mouth daily.   Yes Historical Provider, MD  COMBIVENT RESPIMAT 20-100 MCG/ACT AERS respimat Inhale 2 Inhalers into the lungs as needed. 10/17/15  Yes Historical Provider, MD  gabapentin (NEURONTIN) 300 MG capsule Take 300 mg by mouth daily.    Yes Historical Provider, MD  HYDROcodone-acetaminophen (NORCO) 7.5-325 MG per tablet Take 1 tablet by mouth every 4 (four) hours as needed for pain. 10/26/12  Yes Vickki Hearing, MD  INVOKANA 100 MG TABS tablet Take 100 mg by mouth daily. 10/19/15  Yes Historical Provider, MD  lisinopril (PRINIVIL,ZESTRIL) 20 MG tablet Take 20 mg by mouth daily.   Yes Historical Provider, MD  metFORMIN (GLUCOPHAGE) 500 MG tablet Take 1,000 mg by mouth 2 (two) times daily with a meal.   Yes Historical Provider, MD  VICTOZA 18 MG/3ML SOPN Inject 1.2 Units into the skin daily. 09/05/15  Yes Historical Provider, MD  doxycycline (VIBRAMYCIN) 100 MG capsule Take 1 capsule (100 mg total) by mouth 2 (two) times daily. Patient not taking: Reported on 11/28/2015 09/17/12   Vickki Hearing, MD  glimepiride (AMARYL) 2 MG tablet Take 2 mg by mouth daily. Reported on 11/28/2015    Historical Provider, MD   BP 147/96 mmHg  Pulse 84  Temp(Src) 98 F (36.7 C) (Oral)   Resp 14  Ht  (1.778 m)  Wt 95.255 kg  BMI 30.13 kg/m2  SpO2 97% Physical Exam  Constitutional: He appears well-developed and well-nourished.  HENT:  Head: Normocephalic and atraumatic.  Eyes: Conjunctivae are normal.  Cardiovascular: Normal rate, regular rhythm, normal heart sounds and intact distal pulses.   Pulmonary/Chest: Effort normal. He has wheezes.  Occasional wheeze at bilateral bases, clears with cough.  Abdominal: Soft. Bowel sounds are normal. There is no tenderness.  Musculoskeletal: Normal range of motion.  Neurological: He is alert.  Skin: Skin is warm and dry.  Psychiatric: He has a normal mood and affect.  Nursing note and vitals reviewed.   ED Course  Procedures (including critical care time) Labs Review Labs Reviewed - No data to display  Imaging Review No results found. I have personally reviewed and evaluated these images and lab results as part of my medical decision-making.   EKG Interpretation None      MDM   Final diagnoses:  Cocaine abuse    Pt given outpatient referrals for self referral.  Discussed with him he does need our assistance getting into a program. No need for detox.  The patient appears reasonably screened and/or stabilized for discharge and I doubt any other medical condition or other [facility] requiring further screening, evaluation, or treatment in the ED at this time prior to discharge.     Burgess Amor, PA-C 11/28/15 2316  Loren Racer, MD 11/29/15 [phone]

## 2015-11-28 NOTE — ED Notes (Signed)
Gave patient ice water as requested.  

## 2015-11-28 NOTE — Discharge Instructions (Signed)
Stimulant Use Disorder-Cocaine °Cocaine is one of a group of powerful drugs called stimulants. Cocaine has medical uses for stopping nosebleeds and for pain control before minor nose or dental surgery. However, cocaine is misused because of the effects that it produces. These effects include:  °· A feeling of extreme pleasure. °· Alertness. °· High energy. °Common street names for cocaine include coke, crack, blow, snow, and nose candy. Cocaine is snorted, dissolved in water and injected, or smoked.  °Stimulants are addictive because they activate regions of the brain that produce both the pleasurable sensation of "reward" and psychological dependence. Together, these actions account for loss of control and the rapid development of drug dependence. This means you become ill without the drug (withdrawal) and need to keep using it to function.  °Stimulant use disorder is use of stimulants that disrupts your daily life. It disrupts relationships with family and friends and how you do your job. Cocaine increases your blood pressure and heart rate. It can cause a heart attack or stroke. Cocaine can also cause death from irregular heart rate or seizures. °SYMPTOMS °Symptoms of stimulant use disorder with cocaine include: °· Use of cocaine in larger amounts or over a longer period of time than intended. °· Unsuccessful attempts to cut down or control cocaine use. °· A lot of time spent obtaining, using, or recovering from the effects of cocaine. °· A strong desire or urge to use cocaine (craving). °· Continued use of cocaine in spite of major problems at work, school, or home because of use. °· Continued use of cocaine in spite of relationship problems because of use. °· Giving up or cutting down on important life activities because of cocaine use. °· Use of cocaine over and over in situations when it is physically hazardous, such as driving a car. °· Continued use of cocaine in spite of a physical problem that is likely  related to use. Physical problems can include: °¨ Malnutrition. °¨ Nosebleeds. °¨ Chest pain. °¨ High blood pressure. °¨ A hole that develops between the part of your nose that separates your nostrils (perforated nasal septum). °¨ Lung and kidney damage. °· Continued use of cocaine in spite of a mental problem that is likely related to use. Mental problems can include: °¨ Schizophrenia-like symptoms. °¨ Depression. °¨ Bipolar mood swings. °¨ Anxiety. °¨ Sleep problems. °· Need to use more and more cocaine to get the same effect, or lessened effect over time with use of the same amount of cocaine (tolerance). °· Having withdrawal symptoms when cocaine use is stopped, or using cocaine to reduce or avoid withdrawal symptoms. Withdrawal symptoms include: °¨ Depressed or irritable mood. °¨ Low energy or restlessness. °¨ Bad dreams. °¨ Poor or excessive sleep. °¨ Increased appetite. °DIAGNOSIS °Stimulant use disorder is diagnosed by your health care provider. You may be asked questions about your cocaine use and how it affects your life. A physical exam may be done. A drug screen may be ordered. You may be referred to a mental health professional. The diagnosis of stimulant use disorder requires at least two symptoms within 12 months. The type of stimulant use disorder depends on the number of signs and symptoms you have. The type may be: °· Mild. Two or three signs and symptoms. °· Moderate. Four or five signs and symptoms. °· Severe. Six or more signs and symptoms. °TREATMENT °Treatment for stimulant use disorder is usually provided by mental health professionals with training in substance use disorders. The following options are available: °·   Counseling or talk therapy. Talk therapy addresses the reasons you use cocaine and ways to keep you from using again. Goals of talk therapy include: °¨ Identifying and avoiding triggers for use. °¨ Handling cravings. °¨ Replacing use with healthy activities. °· Support groups.  Support groups provide emotional support, advice, and guidance. °· Medicine. Certain medicines may decrease cocaine cravings or withdrawal symptoms. °HOME CARE INSTRUCTIONS °· Take medicines only as directed by your health care provider. °· Identify the people and activities that trigger your cocaine use and avoid them. °· Keep all follow-up visits as directed by your health care provider. °SEEK MEDICAL CARE IF: °· Your symptoms get worse or you relapse. °· You are not able to take medicines as directed. °SEEK IMMEDIATE MEDICAL CARE IF: °· You have serious thoughts about hurting yourself or others. °· You have a seizure, chest pain, sudden weakness, or loss of speech or vision. °FOR MORE INFORMATION °· National Institute on Drug Abuse: www.drugabuse.gov °· Substance Abuse and Mental Health Services Administration: www.samhsa.gov °  °This information is not intended to replace advice given to you by your health care provider. Make sure you discuss any questions you have with your health care provider. °  °Document Released: 11/22/2000 Document Revised: 12/16/2014 Document Reviewed: 12/08/2013 °Elsevier Interactive Patient Education ©2016 Elsevier Inc. ° ° °Behavioral Health Resources in the Community ° °Intensive Outpatient Programs: °[facility]      °[address] °[place], [place] °[phone] °Both a day and evening program °      °[facility]     °[address]        °[place], [place] [address] °[phone]        ° °[facility] °[address] °[place] °[phone] ° °[facility] °ACCESS LINE: 1-800-853-5163 or [phone] °[address] °[place], [place] [address] °Http://www.[facility].com/services/adult.htm ° °Mobile Crisis Teams:         °                               °[facility]         °Mobile Crisis Care Unit °1-877-626-1772       °      °Assertive °[facility] °[address] [place] °[phone] °                                        °[facility] °Sharon DeEsch °[address] °[place] °[phone] ° °Self-Help/Support Groups: °[facility] Variety of support groups °[phone] (call for more info) ° °Narcotics Anonymous (NA) °[facility] °[address] °[place] - 2 meetings at this location ° °Residential Treatment Programs:  °[facility]      °[address]        °[place]       °[phone]        ° °[facility] °[address] °[place], [place]  [address] °[phone] ° °[facility]  °[address] °[place], [place] [address] °[phone] °Admissions: 8am-3pm M-F ° °[facility]     °[address]        °[place], [place] [address]       °[phone]        ° °  [facility] [address] [place], [place] [phone]  [facility] [address] [place], [place] [phone]  [facility] - Intensive Outpatient      [address]     [place], [place]       [phone]         [facility]     [address] [place], [place] [phone] or [phone]  [facility] ([facility]  [address] [place], [place] [phone]  [facility] [address]                                               [address] [place] [phone]  [facility] Resources: [phone]               General Therapy                                                Angie Fava, PhD        [address] [place], [place] [address]         [phone]   Insurance  [facility]   [address] [place], [place] [address] [phone]  [facility] [address] [place], [place] [address] [phone] Insurance/Medicaid/sponsorship through  [facility]                                              [address]                                        [place], [place] [address]    Therapy/tele-psych/case         [phone]          [facility] [address], [place]  [address]  Adolescent/group home/case management [phone]                                           Creola Corn PhD       General therapy       Insurance   [phone]         Dr. Lolly Mustache Insurance [phone] M-F  [facility], sponsorship [phone]

## 2015-11-28 NOTE — ED Notes (Signed)
Gave patient sprite and peanut butter crackers as requested and approved by Burgess Amor, PA.

## 2017-10-31 DIAGNOSIS — F17210 Nicotine dependence, cigarettes, uncomplicated: Secondary | ICD-10-CM | POA: Diagnosis not present

## 2017-10-31 DIAGNOSIS — R1084 Generalized abdominal pain: Secondary | ICD-10-CM | POA: Diagnosis not present

## 2017-10-31 DIAGNOSIS — I723 Aneurysm of iliac artery: Secondary | ICD-10-CM | POA: Diagnosis not present

## 2017-10-31 DIAGNOSIS — R112 Nausea with vomiting, unspecified: Secondary | ICD-10-CM | POA: Diagnosis not present

## 2017-10-31 DIAGNOSIS — I771 Stricture of artery: Secondary | ICD-10-CM | POA: Diagnosis not present

## 2017-10-31 DIAGNOSIS — R140 Abdominal distension (gaseous): Secondary | ICD-10-CM | POA: Diagnosis not present

## 2017-10-31 DIAGNOSIS — K551 Chronic vascular disorders of intestine: Secondary | ICD-10-CM | POA: Diagnosis not present

## 2017-10-31 DIAGNOSIS — I10 Essential (primary) hypertension: Secondary | ICD-10-CM | POA: Diagnosis not present

## 2017-10-31 DIAGNOSIS — E119 Type 2 diabetes mellitus without complications: Secondary | ICD-10-CM | POA: Diagnosis not present

## 2017-10-31 DIAGNOSIS — J449 Chronic obstructive pulmonary disease, unspecified: Secondary | ICD-10-CM | POA: Diagnosis not present

## 2018-02-09 DIAGNOSIS — Z6829 Body mass index (BMI) 29.0-29.9, adult: Secondary | ICD-10-CM | POA: Diagnosis not present

## 2018-02-09 DIAGNOSIS — J329 Chronic sinusitis, unspecified: Secondary | ICD-10-CM | POA: Diagnosis not present

## 2018-02-09 DIAGNOSIS — I10 Essential (primary) hypertension: Secondary | ICD-10-CM | POA: Diagnosis not present

## 2018-02-09 DIAGNOSIS — J449 Chronic obstructive pulmonary disease, unspecified: Secondary | ICD-10-CM | POA: Diagnosis not present

## 2018-02-09 DIAGNOSIS — G8929 Other chronic pain: Secondary | ICD-10-CM | POA: Diagnosis not present

## 2018-02-09 DIAGNOSIS — E7800 Pure hypercholesterolemia, unspecified: Secondary | ICD-10-CM | POA: Diagnosis not present

## 2018-02-09 DIAGNOSIS — Z299 Encounter for prophylactic measures, unspecified: Secondary | ICD-10-CM | POA: Diagnosis not present

## 2018-02-09 DIAGNOSIS — E1165 Type 2 diabetes mellitus with hyperglycemia: Secondary | ICD-10-CM | POA: Diagnosis not present

## 2018-05-18 DIAGNOSIS — I10 Essential (primary) hypertension: Secondary | ICD-10-CM | POA: Diagnosis not present

## 2018-05-18 DIAGNOSIS — Z6828 Body mass index (BMI) 28.0-28.9, adult: Secondary | ICD-10-CM | POA: Diagnosis not present

## 2018-05-18 DIAGNOSIS — Z299 Encounter for prophylactic measures, unspecified: Secondary | ICD-10-CM | POA: Diagnosis not present

## 2018-05-18 DIAGNOSIS — J449 Chronic obstructive pulmonary disease, unspecified: Secondary | ICD-10-CM | POA: Diagnosis not present

## 2018-05-18 DIAGNOSIS — E1142 Type 2 diabetes mellitus with diabetic polyneuropathy: Secondary | ICD-10-CM | POA: Diagnosis not present

## 2018-05-18 DIAGNOSIS — E1165 Type 2 diabetes mellitus with hyperglycemia: Secondary | ICD-10-CM | POA: Diagnosis not present

## 2018-09-04 DIAGNOSIS — E1165 Type 2 diabetes mellitus with hyperglycemia: Secondary | ICD-10-CM | POA: Diagnosis not present

## 2018-09-04 DIAGNOSIS — Z23 Encounter for immunization: Secondary | ICD-10-CM | POA: Diagnosis not present

## 2018-09-04 DIAGNOSIS — E1142 Type 2 diabetes mellitus with diabetic polyneuropathy: Secondary | ICD-10-CM | POA: Diagnosis not present

## 2018-09-04 DIAGNOSIS — Z299 Encounter for prophylactic measures, unspecified: Secondary | ICD-10-CM | POA: Diagnosis not present

## 2018-09-04 DIAGNOSIS — J449 Chronic obstructive pulmonary disease, unspecified: Secondary | ICD-10-CM | POA: Diagnosis not present

## 2018-09-04 DIAGNOSIS — Z6830 Body mass index (BMI) 30.0-30.9, adult: Secondary | ICD-10-CM | POA: Diagnosis not present

## 2018-09-24 DIAGNOSIS — M545 Low back pain: Secondary | ICD-10-CM | POA: Diagnosis not present

## 2018-09-24 DIAGNOSIS — M79671 Pain in right foot: Secondary | ICD-10-CM | POA: Diagnosis not present

## 2018-09-24 DIAGNOSIS — E119 Type 2 diabetes mellitus without complications: Secondary | ICD-10-CM | POA: Diagnosis not present

## 2018-09-24 DIAGNOSIS — G4700 Insomnia, unspecified: Secondary | ICD-10-CM | POA: Diagnosis not present

## 2018-10-21 DIAGNOSIS — F17200 Nicotine dependence, unspecified, uncomplicated: Secondary | ICD-10-CM | POA: Diagnosis not present

## 2018-10-21 DIAGNOSIS — L509 Urticaria, unspecified: Secondary | ICD-10-CM | POA: Diagnosis not present

## 2018-10-21 DIAGNOSIS — J449 Chronic obstructive pulmonary disease, unspecified: Secondary | ICD-10-CM | POA: Diagnosis not present

## 2018-10-21 DIAGNOSIS — I10 Essential (primary) hypertension: Secondary | ICD-10-CM | POA: Diagnosis not present

## 2018-10-21 DIAGNOSIS — E119 Type 2 diabetes mellitus without complications: Secondary | ICD-10-CM | POA: Diagnosis not present

## 2018-10-21 DIAGNOSIS — Z79899 Other long term (current) drug therapy: Secondary | ICD-10-CM | POA: Diagnosis not present

## 2018-12-07 DIAGNOSIS — Z7189 Other specified counseling: Secondary | ICD-10-CM | POA: Diagnosis not present

## 2018-12-07 DIAGNOSIS — Z1331 Encounter for screening for depression: Secondary | ICD-10-CM | POA: Diagnosis not present

## 2018-12-07 DIAGNOSIS — E1142 Type 2 diabetes mellitus with diabetic polyneuropathy: Secondary | ICD-10-CM | POA: Diagnosis not present

## 2018-12-07 DIAGNOSIS — Z125 Encounter for screening for malignant neoplasm of prostate: Secondary | ICD-10-CM | POA: Diagnosis not present

## 2018-12-07 DIAGNOSIS — Z1211 Encounter for screening for malignant neoplasm of colon: Secondary | ICD-10-CM | POA: Diagnosis not present

## 2018-12-07 DIAGNOSIS — E1165 Type 2 diabetes mellitus with hyperglycemia: Secondary | ICD-10-CM | POA: Diagnosis not present

## 2018-12-07 DIAGNOSIS — Z1339 Encounter for screening examination for other mental health and behavioral disorders: Secondary | ICD-10-CM | POA: Diagnosis not present

## 2018-12-07 DIAGNOSIS — R5383 Other fatigue: Secondary | ICD-10-CM | POA: Diagnosis not present

## 2018-12-07 DIAGNOSIS — Z0000 Encounter for general adult medical examination without abnormal findings: Secondary | ICD-10-CM | POA: Diagnosis not present

## 2018-12-07 DIAGNOSIS — I10 Essential (primary) hypertension: Secondary | ICD-10-CM | POA: Diagnosis not present

## 2018-12-07 DIAGNOSIS — Z6830 Body mass index (BMI) 30.0-30.9, adult: Secondary | ICD-10-CM | POA: Diagnosis not present

## 2018-12-07 DIAGNOSIS — Z299 Encounter for prophylactic measures, unspecified: Secondary | ICD-10-CM | POA: Diagnosis not present

## 2018-12-08 DIAGNOSIS — I10 Essential (primary) hypertension: Secondary | ICD-10-CM | POA: Diagnosis not present

## 2018-12-08 DIAGNOSIS — J449 Chronic obstructive pulmonary disease, unspecified: Secondary | ICD-10-CM | POA: Diagnosis not present

## 2018-12-08 DIAGNOSIS — E119 Type 2 diabetes mellitus without complications: Secondary | ICD-10-CM | POA: Diagnosis not present

## 2019-02-08 DIAGNOSIS — R5383 Other fatigue: Secondary | ICD-10-CM | POA: Diagnosis not present

## 2019-02-08 DIAGNOSIS — E7800 Pure hypercholesterolemia, unspecified: Secondary | ICD-10-CM | POA: Diagnosis not present

## 2019-02-08 DIAGNOSIS — R972 Elevated prostate specific antigen [PSA]: Secondary | ICD-10-CM | POA: Diagnosis not present

## 2019-02-08 DIAGNOSIS — Z79899 Other long term (current) drug therapy: Secondary | ICD-10-CM | POA: Diagnosis not present

## 2019-04-15 ENCOUNTER — Ambulatory Visit (INDEPENDENT_AMBULATORY_CARE_PROVIDER_SITE_OTHER): Payer: Self-pay

## 2019-04-15 ENCOUNTER — Ambulatory Visit (INDEPENDENT_AMBULATORY_CARE_PROVIDER_SITE_OTHER): Payer: 59 | Admitting: Orthopaedic Surgery

## 2019-04-15 ENCOUNTER — Other Ambulatory Visit: Payer: Self-pay

## 2019-04-15 ENCOUNTER — Encounter: Payer: Self-pay | Admitting: Orthopaedic Surgery

## 2019-04-15 VITALS — Ht 68.0 in | Wt 186.0 lb

## 2019-04-15 DIAGNOSIS — M25511 Pain in right shoulder: Secondary | ICD-10-CM

## 2019-04-15 MED ORDER — DIAZEPAM 5 MG PO TABS: ORAL_TABLET | ORAL | 0 refills | Status: DC

## 2019-04-15 NOTE — Progress Notes (Signed)
Office Visit Note   Patient: Kevin Underwood           Date of Birth: 1961/05/18           MRN: [id number] Visit Date: 04/15/2019              Requested by: Kirstie Peri, MD [address] [place], [place] [address] PCP: Kirstie Peri, MD   Assessment & Plan: Visit Diagnoses:  1. Acute pain of right shoulder     Plan: Acute injury right shoulder with  acute rotator cuff tear with pseudoparalysis.  Patient may have acute on chronic tear.  With his inability to abduct his shoulder would recommend proceeding with MRI scan prior to therapy since he has no active abduction.  Follow-up after scan for review.  Follow-Up Instructions: Return after MRI, for after MRI.   Orders:  Orders Placed This Encounter  Procedures  . XR Shoulder Right  . MR SHOULDER RIGHT WO CONTRAST   Meds ordered this encounter  Medications  . diazepam (VALIUM) 5 MG tablet    Sig: Take as directed prior to procedure    Dispense:  2 tablet    Refill:  0      Procedures: No procedures performed   Clinical Data: No additional findings.   Subjective: Chief Complaint  Patient presents with  . Right Shoulder - Pain    DOI 04/13/2019    HPI 58 year old male seen with severe shoulder pain that started Apr 13, 2019 when he slipped coming off the tractor and had to grab hold of the tractor to keep from falling with sharp acute pain in his right shoulder with pop and inability to abduct the arm or use it since that time with pseudoparalysis.  He is used heat ibuprofen hydrocodone with no improvement.  No past history of shoulder problems he denies any neck pain no limitation of motion.  No numbness or tingling in his hand.  He is having trouble sleeping cannot reach his hand to his mouth unless he flexes his neck cannot abduct or abduct the shoulder.  Review of Systems positive for diabetes on Invokana.  Positive for arthritis.  Patient smokes 1/2 pack/day x 20 years occasionally drinks once a month.  Takes a baby  aspirin a day.  He takes some Neurontin for neuropathy in his feet.  Past history of closed fracture of the fifth metacarpal.  Otherwise negative as pertains HPI.   Objective: Vital Signs: Ht 5\' 8"  (1.727 m)   Wt 186 lb (84.4 kg)   BMI 28.28 kg/m   Physical Exam Constitutional:      Appearance: He is well-developed.  HENT:     Head: Normocephalic and atraumatic.  Eyes:     Pupils: Pupils are equal, round, and reactive to light.  Neck:     Thyroid: No thyromegaly.     Trachea: No tracheal deviation.  Cardiovascular:     Rate and Rhythm: Normal rate.  Pulmonary:     Effort: Pulmonary effort is normal.     Breath sounds: No wheezing.  Abdominal:     General: Bowel sounds are normal.     Palpations: Abdomen is soft.  Skin:    General: Skin is warm and dry.     Capillary Refill: Capillary refill takes less than 2 seconds.  Neurological:     Mental Status: He is alert and oriented to person, place, and time.  Psychiatric:        Behavior: Behavior normal.  Thought Content: Thought content normal.        Judgment: Judgment normal.     Ortho Exam patient can abduct the shoulder 10 degrees with positive drop arm test with passive motion is 60 degrees.  Subscap is strong no external rotation weakness.  Long of the biceps is normal no ecchymosis pain with palpation in the supraspinatus fossa.  Upper extremity reflexes are 2+ symmetrical no supraclavicular lymphadenopathy no winging of the scapula.  Ulnar nerve the elbow median nerve at the wrist are normal no atrophy biceps triceps or deltoid. Specialty Comments:  No specialty comments available.  Imaging: Xr Shoulder Right  Result Date: 04/15/2019 Right shoulder AP and Y scapular x-ray are obtained and reviewed.  This shows minimal inferior osteophytes at the glenohumeral joint slight sclerosis at the greater tuberosity rotator cuff insertion site and mild acromioclavicular degenerative changes.  No soft tissue calcification.  Impression: Mild right shoulder and acromioclavicular joint degenerative changes.    PMFS History: Patient Active Problem List   Diagnosis Date Noted  . Closed fracture of 5th metacarpal 08/31/2012   Past Medical History:  Diagnosis Date  . Arthritis   . COPD (chronic obstructive pulmonary disease) (HCC)   . Diabetes mellitus   . Hypertension     Family History  Problem Relation Age of Onset  . Arthritis Unknown   . Lung disease Unknown   . Asthma Unknown   . Diabetes Unknown     Past Surgical History:  Procedure Laterality Date  . BACK SURGERY    . FOOT SURGERY    . HAND SURGERY     Social History   Occupational History  . Not on file  Tobacco Use  . Smoking status: Current Every Day Smoker    Packs/day: 1.00    Years: 30.00    Pack years: 30.00    Types: Cigarettes  . Smokeless tobacco: Never Used  Substance and Sexual Activity  . Alcohol use: Yes    Comment: once a week  . Drug use: No    Comment: pt denies use  . Sexual activity: Not on file

## 2019-04-19 ENCOUNTER — Telehealth: Payer: Self-pay | Admitting: Radiology

## 2019-04-19 NOTE — Telephone Encounter (Signed)
Call received from [place], MRI at [facility]. Patient was in too much pain to be able to have MRI.  Kevin Underwood states that they would be glad to try again if you would like to give patient medication to be able to handle it. Please advise.

## 2019-04-20 NOTE — Telephone Encounter (Signed)
Ok valium pre-op. No to pain meds with cocaine abuse Hx etc. thanks

## 2019-04-20 NOTE — Telephone Encounter (Signed)
I called and spoke with patient's wife. She states patient took Valium prior to scan with no relief. He is also taking Hydrocodone for pain.  When the imaging staff picked his arm up to get it in the holder for the scanner, the pain was incredible and he could not do it. Per his wife, he will need something for pain and anxiety prior to having the scan and the valium and hydrocodone were not enough.  Patient was not at home at time of phone call, but will be back soon.  Please advise.

## 2019-04-20 NOTE — Telephone Encounter (Signed)
I called , discussed.  Patient was given 30 Norco by another provider he still has some left.  I discussed him that is best not to give him stronger pain medicine in particular with his past history of cocaine abuse.  We could reorder the Valium and I recommend he wait a week see if his shoulder pain decreases and then he could take some of the Vicodin as well as the Valium just before scan.  He will save the few tablets he has left of the Norco for that time.  He will call us in a week when he feels that the shoulder pain is decreased and he thinks he be able to tolerate the scan.

## 2019-04-21 NOTE — Telephone Encounter (Signed)
noted 

## 2019-04-22 ENCOUNTER — Other Ambulatory Visit: Payer: Self-pay

## 2019-04-22 ENCOUNTER — Ambulatory Visit: Payer: 59 | Admitting: Orthopaedic Surgery

## 2019-06-21 ENCOUNTER — Encounter: Payer: Self-pay | Admitting: Orthopedic Surgery

## 2019-11-20 ENCOUNTER — Emergency Department (HOSPITAL_COMMUNITY): Payer: 59

## 2019-11-20 ENCOUNTER — Other Ambulatory Visit: Payer: Self-pay

## 2019-11-20 ENCOUNTER — Inpatient Hospital Stay (HOSPITAL_COMMUNITY)
Admission: EM | Admit: 2019-11-20 | Discharge: 2019-12-08 | DRG: 215 | Disposition: A | Discharge status: Home / Self Care | Payer: 59 | Attending: Cardiothoracic Surgery | Admitting: Cardiothoracic Surgery

## 2019-11-20 ENCOUNTER — Encounter (HOSPITAL_COMMUNITY): Payer: Self-pay | Admitting: Emergency Medicine

## 2019-11-20 DIAGNOSIS — Z79891 Long term (current) use of opiate analgesic: Secondary | ICD-10-CM

## 2019-11-20 DIAGNOSIS — R739 Hyperglycemia, unspecified: Secondary | ICD-10-CM | POA: Diagnosis not present

## 2019-11-20 DIAGNOSIS — D696 Thrombocytopenia, unspecified: Secondary | ICD-10-CM | POA: Diagnosis not present

## 2019-11-20 DIAGNOSIS — I4819 Other persistent atrial fibrillation: Secondary | ICD-10-CM | POA: Diagnosis not present

## 2019-11-20 DIAGNOSIS — R519 Headache, unspecified: Secondary | ICD-10-CM | POA: Diagnosis not present

## 2019-11-20 DIAGNOSIS — J441 Chronic obstructive pulmonary disease with (acute) exacerbation: Secondary | ICD-10-CM | POA: Diagnosis present

## 2019-11-20 DIAGNOSIS — F410 Panic disorder [episodic paroxysmal anxiety] without agoraphobia: Secondary | ICD-10-CM | POA: Diagnosis not present

## 2019-11-20 DIAGNOSIS — E8779 Other fluid overload: Secondary | ICD-10-CM | POA: Diagnosis not present

## 2019-11-20 DIAGNOSIS — Z95811 Presence of heart assist device: Secondary | ICD-10-CM

## 2019-11-20 DIAGNOSIS — F1020 Alcohol dependence, uncomplicated: Secondary | ICD-10-CM | POA: Diagnosis present

## 2019-11-20 DIAGNOSIS — Z9889 Other specified postprocedural states: Secondary | ICD-10-CM

## 2019-11-20 DIAGNOSIS — Z09 Encounter for follow-up examination after completed treatment for conditions other than malignant neoplasm: Secondary | ICD-10-CM

## 2019-11-20 DIAGNOSIS — W1830XA Fall on same level, unspecified, initial encounter: Secondary | ICD-10-CM | POA: Diagnosis not present

## 2019-11-20 DIAGNOSIS — I5043 Acute on chronic combined systolic (congestive) and diastolic (congestive) heart failure: Secondary | ICD-10-CM | POA: Diagnosis not present

## 2019-11-20 DIAGNOSIS — Z4659 Encounter for fitting and adjustment of other gastrointestinal appliance and device: Secondary | ICD-10-CM

## 2019-11-20 DIAGNOSIS — I340 Nonrheumatic mitral (valve) insufficiency: Secondary | ICD-10-CM | POA: Diagnosis not present

## 2019-11-20 DIAGNOSIS — J449 Chronic obstructive pulmonary disease, unspecified: Secondary | ICD-10-CM | POA: Diagnosis present

## 2019-11-20 DIAGNOSIS — E119 Type 2 diabetes mellitus without complications: Secondary | ICD-10-CM | POA: Diagnosis not present

## 2019-11-20 DIAGNOSIS — I472 Ventricular tachycardia: Secondary | ICD-10-CM | POA: Diagnosis not present

## 2019-11-20 DIAGNOSIS — E871 Hypo-osmolality and hyponatremia: Secondary | ICD-10-CM | POA: Diagnosis present

## 2019-11-20 DIAGNOSIS — I110 Hypertensive heart disease with heart failure: Secondary | ICD-10-CM | POA: Diagnosis present

## 2019-11-20 DIAGNOSIS — I509 Heart failure, unspecified: Secondary | ICD-10-CM

## 2019-11-20 DIAGNOSIS — F1991 Other psychoactive substance use, unspecified, in remission: Secondary | ICD-10-CM

## 2019-11-20 DIAGNOSIS — R0603 Acute respiratory distress: Secondary | ICD-10-CM | POA: Diagnosis present

## 2019-11-20 DIAGNOSIS — Z7982 Long term (current) use of aspirin: Secondary | ICD-10-CM

## 2019-11-20 DIAGNOSIS — R451 Restlessness and agitation: Secondary | ICD-10-CM | POA: Diagnosis not present

## 2019-11-20 DIAGNOSIS — J440 Chronic obstructive pulmonary disease with acute lower respiratory infection: Secondary | ICD-10-CM | POA: Diagnosis present

## 2019-11-20 DIAGNOSIS — F17210 Nicotine dependence, cigarettes, uncomplicated: Secondary | ICD-10-CM | POA: Diagnosis present

## 2019-11-20 DIAGNOSIS — D62 Acute posthemorrhagic anemia: Secondary | ICD-10-CM | POA: Diagnosis not present

## 2019-11-20 DIAGNOSIS — J942 Hemothorax: Secondary | ICD-10-CM | POA: Diagnosis not present

## 2019-11-20 DIAGNOSIS — J9600 Acute respiratory failure, unspecified whether with hypoxia or hypercapnia: Secondary | ICD-10-CM | POA: Diagnosis not present

## 2019-11-20 DIAGNOSIS — I429 Cardiomyopathy, unspecified: Secondary | ICD-10-CM

## 2019-11-20 DIAGNOSIS — R7989 Other specified abnormal findings of blood chemistry: Secondary | ICD-10-CM | POA: Diagnosis not present

## 2019-11-20 DIAGNOSIS — I5023 Acute on chronic systolic (congestive) heart failure: Secondary | ICD-10-CM | POA: Diagnosis present

## 2019-11-20 DIAGNOSIS — R600 Localized edema: Secondary | ICD-10-CM | POA: Diagnosis not present

## 2019-11-20 DIAGNOSIS — Z20828 Contact with and (suspected) exposure to other viral communicable diseases: Secondary | ICD-10-CM | POA: Diagnosis present

## 2019-11-20 DIAGNOSIS — Z87898 Personal history of other specified conditions: Secondary | ICD-10-CM

## 2019-11-20 DIAGNOSIS — I480 Paroxysmal atrial fibrillation: Secondary | ICD-10-CM | POA: Diagnosis present

## 2019-11-20 DIAGNOSIS — Z7984 Long term (current) use of oral hypoglycemic drugs: Secondary | ICD-10-CM

## 2019-11-20 DIAGNOSIS — I255 Ischemic cardiomyopathy: Secondary | ICD-10-CM | POA: Diagnosis present

## 2019-11-20 DIAGNOSIS — Z833 Family history of diabetes mellitus: Secondary | ICD-10-CM

## 2019-11-20 DIAGNOSIS — F1011 Alcohol abuse, in remission: Secondary | ICD-10-CM | POA: Diagnosis present

## 2019-11-20 DIAGNOSIS — I5021 Acute systolic (congestive) heart failure: Secondary | ICD-10-CM | POA: Diagnosis not present

## 2019-11-20 DIAGNOSIS — E876 Hypokalemia: Secondary | ICD-10-CM | POA: Diagnosis not present

## 2019-11-20 DIAGNOSIS — R570 Cardiogenic shock: Secondary | ICD-10-CM | POA: Diagnosis not present

## 2019-11-20 DIAGNOSIS — R1011 Right upper quadrant pain: Secondary | ICD-10-CM

## 2019-11-20 DIAGNOSIS — Z951 Presence of aortocoronary bypass graft: Secondary | ICD-10-CM

## 2019-11-20 DIAGNOSIS — R251 Tremor, unspecified: Secondary | ICD-10-CM | POA: Diagnosis not present

## 2019-11-20 DIAGNOSIS — E1165 Type 2 diabetes mellitus with hyperglycemia: Secondary | ICD-10-CM | POA: Diagnosis present

## 2019-11-20 DIAGNOSIS — F40240 Claustrophobia: Secondary | ICD-10-CM | POA: Diagnosis not present

## 2019-11-20 DIAGNOSIS — K5900 Constipation, unspecified: Secondary | ICD-10-CM | POA: Diagnosis present

## 2019-11-20 DIAGNOSIS — Y92230 Patient room in hospital as the place of occurrence of the external cause: Secondary | ICD-10-CM | POA: Diagnosis not present

## 2019-11-20 DIAGNOSIS — I2510 Atherosclerotic heart disease of native coronary artery without angina pectoris: Secondary | ICD-10-CM | POA: Diagnosis present

## 2019-11-20 DIAGNOSIS — I4891 Unspecified atrial fibrillation: Secondary | ICD-10-CM | POA: Diagnosis not present

## 2019-11-20 DIAGNOSIS — Z452 Encounter for adjustment and management of vascular access device: Secondary | ICD-10-CM

## 2019-11-20 DIAGNOSIS — R188 Other ascites: Secondary | ICD-10-CM

## 2019-11-20 DIAGNOSIS — Z885 Allergy status to narcotic agent status: Secondary | ICD-10-CM

## 2019-11-20 DIAGNOSIS — J9601 Acute respiratory failure with hypoxia: Secondary | ICD-10-CM | POA: Diagnosis present

## 2019-11-20 DIAGNOSIS — G4733 Obstructive sleep apnea (adult) (pediatric): Secondary | ICD-10-CM | POA: Diagnosis present

## 2019-11-20 DIAGNOSIS — E1169 Type 2 diabetes mellitus with other specified complication: Secondary | ICD-10-CM | POA: Diagnosis not present

## 2019-11-20 DIAGNOSIS — I493 Ventricular premature depolarization: Secondary | ICD-10-CM | POA: Diagnosis not present

## 2019-11-20 DIAGNOSIS — I361 Nonrheumatic tricuspid (valve) insufficiency: Secondary | ICD-10-CM | POA: Diagnosis not present

## 2019-11-20 DIAGNOSIS — I10 Essential (primary) hypertension: Secondary | ICD-10-CM

## 2019-11-20 DIAGNOSIS — R9431 Abnormal electrocardiogram [ECG] [EKG]: Secondary | ICD-10-CM | POA: Diagnosis present

## 2019-11-20 DIAGNOSIS — Z79899 Other long term (current) drug therapy: Secondary | ICD-10-CM

## 2019-11-20 DIAGNOSIS — Z79890 Hormone replacement therapy (postmenopausal): Secondary | ICD-10-CM

## 2019-11-20 DIAGNOSIS — I2721 Secondary pulmonary arterial hypertension: Secondary | ICD-10-CM | POA: Diagnosis not present

## 2019-11-20 DIAGNOSIS — J90 Pleural effusion, not elsewhere classified: Secondary | ICD-10-CM

## 2019-11-20 DIAGNOSIS — Z9689 Presence of other specified functional implants: Secondary | ICD-10-CM

## 2019-11-20 DIAGNOSIS — Z825 Family history of asthma and other chronic lower respiratory diseases: Secondary | ICD-10-CM

## 2019-11-20 HISTORY — DX: Type 2 diabetes mellitus without complications: E11.9

## 2019-11-20 HISTORY — DX: Essential (primary) hypertension: I10

## 2019-11-20 LAB — CBC WITH DIFFERENTIAL/PLATELET
Abs Immature Granulocytes: 0.02 10*3/uL (ref 0.00–0.07)
Basophils Absolute: 0.1 10*3/uL (ref 0.0–0.1)
Basophils Relative: 1 %
Eosinophils Absolute: 0.1 10*3/uL (ref 0.0–0.5)
Eosinophils Relative: 1 %
HCT: 49.1 % (ref 39.0–52.0)
Hemoglobin: 15.4 g/dL (ref 13.0–17.0)
Immature Granulocytes: 0 %
Lymphocytes Relative: 21 %
Lymphs Abs: 2.2 10*3/uL (ref 0.7–4.0)
MCH: 28.3 pg (ref 26.0–34.0)
MCHC: 31.4 g/dL (ref 30.0–36.0)
MCV: 90.3 fL (ref 80.0–100.0)
Monocytes Absolute: 0.8 10*3/uL (ref 0.1–1.0)
Monocytes Relative: 7 %
Neutro Abs: 7.2 10*3/uL (ref 1.7–7.7)
Neutrophils Relative %: 70 %
Platelets: 230 10*3/uL (ref 150–400)
RBC: 5.44 MIL/uL (ref 4.22–5.81)
RDW: 14.3 % (ref 11.5–15.5)
WBC: 10.3 10*3/uL (ref 4.0–10.5)
nRBC: 0 % (ref 0.0–0.2)

## 2019-11-20 LAB — COMPREHENSIVE METABOLIC PANEL
ALT: 48 U/L — ABNORMAL HIGH (ref 0–44)
AST: 28 U/L (ref 15–41)
Albumin: 3.3 g/dL — ABNORMAL LOW (ref 3.5–5.0)
Alkaline Phosphatase: 92 U/L (ref 38–126)
Anion gap: 12 (ref 5–15)
BUN: 16 mg/dL (ref 6–20)
CO2: 24 mmol/L (ref 22–32)
Calcium: 8.6 mg/dL — ABNORMAL LOW (ref 8.9–10.3)
Chloride: 96 mmol/L — ABNORMAL LOW (ref 98–111)
Creatinine, Ser: 0.94 mg/dL (ref 0.61–1.24)
GFR calc Af Amer: >60 mL/min (ref >60)
GFR calc non Af Amer: >60 mL/min (ref >60)
Glucose, Bld: 407 mg/dL — ABNORMAL HIGH (ref 70–99)
Potassium: 4.5 mmol/L (ref 3.5–5.1)
Sodium: 132 mmol/L — ABNORMAL LOW (ref 135–145)
Total Bilirubin: 0.6 mg/dL (ref 0.3–1.2)
Total Protein: 6.2 g/dL — ABNORMAL LOW (ref 6.5–8.1)

## 2019-11-20 LAB — TSH: TSH: 1.658 u[IU]/mL (ref 0.350–4.500)

## 2019-11-20 LAB — POC SARS CORONAVIRUS 2 AG -  ED: SARS Coronavirus 2 Ag: NEGATIVE

## 2019-11-20 LAB — TROPONIN I (HIGH SENSITIVITY)
Troponin I (High Sensitivity): 34 ng/L — ABNORMAL HIGH (ref <18)
Troponin I (High Sensitivity): 32 ng/L — ABNORMAL HIGH (ref <18)

## 2019-11-20 LAB — LIPASE, BLOOD: Lipase: 30 U/L (ref 11–51)

## 2019-11-20 LAB — HEMOGLOBIN A1C
Hgb A1c MFr Bld: 12.1 % — ABNORMAL HIGH (ref 4.8–5.6)
Mean Plasma Glucose: 300.57 mg/dL

## 2019-11-20 LAB — GLUCOSE, CAPILLARY
Glucose-Capillary: 459 mg/dL — ABNORMAL HIGH (ref 70–99)
Glucose-Capillary: 323 mg/dL — ABNORMAL HIGH (ref 70–99)

## 2019-11-20 LAB — D-DIMER, QUANTITATIVE: D-Dimer, Quant: 2.61 ug/mL-FEU — ABNORMAL HIGH (ref 0.00–0.50)

## 2019-11-20 LAB — BRAIN NATRIURETIC PEPTIDE: B Natriuretic Peptide: 862 pg/mL — ABNORMAL HIGH (ref 0.0–100.0)

## 2019-11-20 LAB — MRSA PCR SCREENING: MRSA by PCR: NEGATIVE

## 2019-11-20 MED ORDER — DILTIAZEM HCL 25 MG/5ML IV SOLN
5.0000 mg | Freq: Once | INTRAVENOUS | Status: AC
  Administered 2019-11-20: 5 mg via INTRAVENOUS
  Filled 2019-11-20: qty 5

## 2019-11-20 MED ORDER — INSULIN ASPART 100 UNIT/ML ~~LOC~~ SOLN
0.0000 [IU] | Freq: Every day | SUBCUTANEOUS | Status: DC
  Administered 2019-11-20: 4 [IU] via SUBCUTANEOUS
  Administered 2019-11-24 – 2019-11-25 (×2): 3 [IU] via SUBCUTANEOUS

## 2019-11-20 MED ORDER — DILTIAZEM HCL 25 MG/5ML IV SOLN: 10.0000 mg | Freq: Once | INTRAVENOUS | Status: DC

## 2019-11-20 MED ORDER — MOMETASONE FURO-FORMOTEROL FUM 200-5 MCG/ACT IN AERO
2.0000 | INHALATION_SPRAY | Freq: Two times a day (BID) | RESPIRATORY_TRACT | Status: DC
  Administered 2019-11-21 – 2019-11-23 (×4): 2 via RESPIRATORY_TRACT
  Filled 2019-11-20 (×2): qty 8.8

## 2019-11-20 MED ORDER — IPRATROPIUM-ALBUTEROL 0.5-2.5 (3) MG/3ML IN SOLN: 3.0000 mL | Freq: Two times a day (BID) | RESPIRATORY_TRACT | Status: DC

## 2019-11-20 MED ORDER — METOPROLOL TARTRATE 5 MG/5ML IV SOLN
5.0000 mg | Freq: Four times a day (QID) | INTRAVENOUS | Status: DC
  Administered 2019-11-20 – 2019-11-21 (×5): 5 mg via INTRAVENOUS
  Filled 2019-11-20 (×5): qty 5

## 2019-11-20 MED ORDER — ALBUTEROL SULFATE HFA 108 (90 BASE) MCG/ACT IN AERS
4.0000 | INHALATION_SPRAY | Freq: Once | RESPIRATORY_TRACT | Status: AC
  Administered 2019-11-20: 4 via RESPIRATORY_TRACT
  Filled 2019-11-20: qty 6.7

## 2019-11-20 MED ORDER — POTASSIUM CHLORIDE CRYS ER 10 MEQ PO TBCR
10.0000 meq | EXTENDED_RELEASE_TABLET | Freq: Two times a day (BID) | ORAL | Status: DC
  Administered 2019-11-20 – 2019-11-25 (×10): 10 meq via ORAL
  Filled 2019-11-20 (×11): qty 1

## 2019-11-20 MED ORDER — METHYLPREDNISOLONE SODIUM SUCC 125 MG IJ SOLR
80.0000 mg | Freq: Once | INTRAMUSCULAR | Status: AC
  Administered 2019-11-20: 80 mg via INTRAVENOUS
  Filled 2019-11-20: qty 2

## 2019-11-20 MED ORDER — INSULIN GLARGINE 100 UNIT/ML ~~LOC~~ SOLN
15.0000 [IU] | Freq: Every day | SUBCUTANEOUS | Status: DC
  Administered 2019-11-20 – 2019-11-21 (×2): 15 [IU] via SUBCUTANEOUS
  Filled 2019-11-20 (×3): qty 0.15

## 2019-11-20 MED ORDER — LIVING BETTER WITH HEART FAILURE BOOK
Freq: Once | Status: AC
  Administered 2019-11-20: 18:00:00

## 2019-11-20 MED ORDER — INSULIN ASPART 100 UNIT/ML ~~LOC~~ SOLN
0.0000 [IU] | Freq: Three times a day (TID) | SUBCUTANEOUS | Status: DC
  Administered 2019-11-21: 5 [IU] via SUBCUTANEOUS
  Administered 2019-11-22 (×2): 3 [IU] via SUBCUTANEOUS
  Administered 2019-11-23 (×2): 8 [IU] via SUBCUTANEOUS
  Administered 2019-11-23: 08:00:00 5 [IU] via SUBCUTANEOUS
  Administered 2019-11-24: 3 [IU] via SUBCUTANEOUS
  Administered 2019-11-24: 15:00:00 5 [IU] via SUBCUTANEOUS
  Administered 2019-11-25: 2 [IU] via SUBCUTANEOUS
  Administered 2019-11-25: 08:00:00 5 [IU] via SUBCUTANEOUS
  Administered 2019-11-25: 3 [IU] via SUBCUTANEOUS

## 2019-11-20 MED ORDER — CHLORHEXIDINE GLUCONATE CLOTH 2 % EX PADS
6.0000 | MEDICATED_PAD | Freq: Every day | CUTANEOUS | Status: DC
  Administered 2019-11-22 – 2019-11-26 (×5): 6 via TOPICAL

## 2019-11-20 MED ORDER — DILTIAZEM HCL 30 MG PO TABS
30.0000 mg | ORAL_TABLET | Freq: Four times a day (QID) | ORAL | Status: DC
  Administered 2019-11-20 – 2019-11-21 (×4): 30 mg via ORAL
  Filled 2019-11-20 (×4): qty 1

## 2019-11-20 MED ORDER — ALBUTEROL SULFATE (2.5 MG/3ML) 0.083% IN NEBU
1.0000 mL | INHALATION_SOLUTION | Freq: Four times a day (QID) | RESPIRATORY_TRACT | Status: DC | PRN
  Administered 2019-11-21: 3 mL via RESPIRATORY_TRACT
  Administered 2019-11-22 – 2019-11-24 (×2): 1 mL via RESPIRATORY_TRACT
  Filled 2019-11-20 (×3): qty 3

## 2019-11-20 MED ORDER — INSULIN ASPART 100 UNIT/ML ~~LOC~~ SOLN
4.0000 [IU] | Freq: Three times a day (TID) | SUBCUTANEOUS | Status: DC
  Administered 2019-11-20 – 2019-11-21 (×3): 4 [IU] via SUBCUTANEOUS

## 2019-11-20 MED ORDER — ALPRAZOLAM 0.5 MG PO TABS
0.5000 mg | ORAL_TABLET | Freq: Two times a day (BID) | ORAL | Status: DC | PRN
  Administered 2019-11-20: 0.5 mg via ORAL
  Filled 2019-11-20: qty 1

## 2019-11-20 MED ORDER — FUROSEMIDE 10 MG/ML IJ SOLN
20.0000 mg | Freq: Once | INTRAMUSCULAR | Status: AC
  Administered 2019-11-20: 20 mg via INTRAVENOUS
  Filled 2019-11-20: qty 2

## 2019-11-20 MED ORDER — RAMIPRIL 1.25 MG PO CAPS
1.2500 mg | ORAL_CAPSULE | Freq: Every day | ORAL | Status: DC
  Administered 2019-11-21: 1.25 mg via ORAL
  Filled 2019-11-20 (×4): qty 1

## 2019-11-20 MED ORDER — GABAPENTIN 300 MG PO CAPS
600.0000 mg | ORAL_CAPSULE | Freq: Two times a day (BID) | ORAL | Status: DC | PRN
  Administered 2019-11-23: 600 mg via ORAL
  Filled 2019-11-20: qty 2

## 2019-11-20 MED ORDER — INSULIN ASPART 100 UNIT/ML ~~LOC~~ SOLN
15.0000 [IU] | Freq: Once | SUBCUTANEOUS | Status: AC
  Administered 2019-11-20: 15 [IU] via SUBCUTANEOUS

## 2019-11-20 MED ORDER — IPRATROPIUM-ALBUTEROL 0.5-2.5 (3) MG/3ML IN SOLN
3.0000 mL | Freq: Two times a day (BID) | RESPIRATORY_TRACT | Status: DC
  Administered 2019-11-20 – 2019-11-25 (×9): 3 mL via RESPIRATORY_TRACT
  Filled 2019-11-20 (×8): qty 3

## 2019-11-20 MED ORDER — APIXABAN 5 MG PO TABS
5.0000 mg | ORAL_TABLET | Freq: Two times a day (BID) | ORAL | Status: DC
  Administered 2019-11-20 – 2019-11-22 (×5): 5 mg via ORAL
  Filled 2019-11-20 (×6): qty 1

## 2019-11-20 MED ORDER — FUROSEMIDE 10 MG/ML IJ SOLN
20.0000 mg | Freq: Two times a day (BID) | INTRAMUSCULAR | Status: DC
  Administered 2019-11-20 – 2019-11-21 (×2): 20 mg via INTRAVENOUS
  Filled 2019-11-20 (×2): qty 2

## 2019-11-20 MED ORDER — IOHEXOL 350 MG/ML SOLN
100.0000 mL | Freq: Once | INTRAVENOUS | Status: AC | PRN
  Administered 2019-11-20: 100 mL via INTRAVENOUS

## 2019-11-20 NOTE — ED Notes (Signed)
To CT

## 2019-11-20 NOTE — ED Triage Notes (Signed)
Pt states he has been progressively sob for the past few days with swelling in legs and abdomen. Pt was recently tested for covid due to exposure to his boss and was negative.

## 2019-11-20 NOTE — ED Provider Notes (Signed)
[facility] EMERGENCY DEPARTMENT Provider Note   CSN: [id number] Arrival date & time: 11/20/19  [id number]     History Chief Complaint  Patient presents with  . Shortness of Breath    Kevin SharpsJames E Elting is a 58 y.o. male.  HPI      Kevin Underwood is a 58 y.o. male with past medical history of COPD, hypertension, and diabetes presents to the Emergency Department complaining of progressing shortness of breath since last week.  He states he was recently admitted [facility].  He was discharged on antibiotics which he states he has not been able to take due to intolerance of the medication.  Since being discharged home, he has noticed increasing swelling of his legs and abdomen.  He complains of feeling "full" to his abdomen with decreased appetite and constipation for several days.  He notes having swelling of his lower legs with his right greater than left.  He has been using his albuterol nebulizer at home without relief of his shortness of breath.  He denies chest pain, fever, chills, vomiting, and history of prior PEs.  He is a current smoker.  Past Medical History:  Diagnosis Date  . Arthritis   . COPD (chronic obstructive pulmonary disease) (HCC)   . Diabetes mellitus   . Hypertension     Patient Active Problem List   Diagnosis Date Noted  . Closed fracture of 5th metacarpal 08/31/2012    Past Surgical History:  Procedure Laterality Date  . BACK SURGERY    . FOOT SURGERY    . HAND SURGERY         Family History  Problem Relation Age of Onset  . Arthritis Other   . Lung disease Other   . Asthma Other   . Diabetes Other     Social History   Tobacco Use  . Smoking status: Current Every Day Smoker    Packs/day: 1.00    Years: 30.00    Pack years: 30.00    Types: Cigarettes  . Smokeless tobacco: Never Used  Substance Use Topics  . Alcohol use: Yes    Comment: daily until 8 months ago  . Drug use: No    Home Medications Prior to  Admission medications   Medication Sig Start Date End Date Taking? Authorizing Provider  ADVAIR DISKUS 250-50 MCG/DOSE AEPB Inhale 2 puffs into the lungs 2 (two) times daily. 09/14/15   [provider]  albuterol (PROVENTIL) (2.5 MG/3ML) 0.083% nebulizer solution Inhale 1 mL into the lungs 4 (four) times daily as needed. 10/17/15   [provider]  aspirin 81 MG tablet Take 81 mg by mouth daily.    [provider]  COMBIVENT RESPIMAT 20-100 MCG/ACT AERS respimat Inhale 2 Inhalers into the lungs as needed. 10/17/15   [provider]  diazepam (VALIUM) 5 MG tablet Take as directed prior to procedure 04/15/19   Eldred MangesYates, Mark C, MD  doxycycline (VIBRAMYCIN) 100 MG capsule Take 1 capsule (100 mg total) by mouth 2 (two) times daily. Patient not taking: Reported on 11/28/2015 09/17/12   Vickki HearingHarrison, Stanley E, MD  gabapentin (NEURONTIN) 300 MG capsule Take 300 mg by mouth daily.     [provider]  glimepiride (AMARYL) 2 MG tablet Take 2 mg by mouth daily. Reported on 11/28/2015    [provider]  HYDROcodone-acetaminophen (NORCO) 7.5-325 MG per tablet Take 1 tablet by mouth every 4 (four) hours as needed for pain. Patient not taking: Reported on  04/15/2019 10/26/12   Carole Civil, MD  HYDROcodone-acetaminophen (NORCO/VICODIN) 5-325 MG tablet  04/14/19   [provider]  ibuprofen (ADVIL) 800 MG tablet  04/14/19   [provider]  INVOKANA 100 MG TABS tablet Take 100 mg by mouth daily. 10/19/15   [provider]  lisinopril (PRINIVIL,ZESTRIL) 20 MG tablet Take 20 mg by mouth daily.    [provider]  metFORMIN (GLUCOPHAGE) 500 MG tablet Take 1,000 mg by mouth 2 (two) times daily with a meal.    [provider]  SYMBICORT 160-4.5 MCG/ACT inhaler TAKE 2 PUFFS BY MOUTH TWICE A DAY 03/30/19   [provider]  VICTOZA 18 MG/3ML SOPN Inject 1.2 Units into the skin daily. 09/05/15   [provider]     Allergies    Patient has no known allergies.  Review of Systems   Review of Systems  Constitutional: Positive for appetite change. Negative for activity change, chills and fever.  HENT: Negative for congestion and sore throat.   Respiratory: Positive for chest tightness, shortness of breath and wheezing. Negative for cough.   Cardiovascular: Positive for leg swelling. Negative for chest pain.  Gastrointestinal: Positive for abdominal distention, abdominal pain and constipation. Negative for nausea and vomiting.  Genitourinary: Negative for difficulty urinating, dysuria and flank pain.  Musculoskeletal: Positive for myalgias.       Patient has noticeable edema of the right lower leg compared to the left.  No palpable cord but posterior lower leg is tender to palpation.  Skin: Negative for color change.       Skin is warm and dry, bilateral lower extremities appears mottled.  With nonblanching petechia of the right lower extremity.  Neurological: Negative for syncope, weakness, numbness and headaches.  Psychiatric/Behavioral: Negative for confusion.    Physical Exam Updated Vital Signs BP (!) 133/96   Pulse (!) 111   Temp (!) 97.5 F (36.4 C) (Oral)   Resp (!) 25   Ht 5\' 9"  (1.753 m)   Wt 79.4 kg   SpO2 99%   BMI 25.84 kg/m   Physical Exam Constitutional:      Appearance: He is well-developed. He is not diaphoretic.     Comments: Patient is uncomfortable appearing, but nontoxic  HENT:     Mouth/Throat:     Mouth: Mucous membranes are moist.  Cardiovascular:     Rate and Rhythm: Tachycardia present.     Pulses: Normal pulses.     Comments: Palpable dorsalis pedis pulse bilaterally, also heard with portable Doppler on the right. Pulmonary:     Effort: Pulmonary effort is normal.     Breath sounds: Wheezing present.     Comments: Patient with labored breathing, able to speak in full sentences.  Expiratory wheezes noted throughout.  No rales. Chest:     Chest wall: No  tenderness.  Abdominal:     Tenderness: There is abdominal tenderness.     Comments: Abdomen is mildly distended.  No fluid wave noted.  Musculoskeletal:     Cervical back: Normal range of motion. No rigidity or tenderness.  Lymphadenopathy:     Cervical: No cervical adenopathy.  Skin:    General: Skin is warm.     Capillary Refill: Capillary refill takes less than 2 seconds.  Neurological:     General: No focal deficit present.     Mental Status: He is alert.     ED Results / Procedures / Treatments   Labs (all labs ordered are listed,  but only abnormal results are displayed) Labs Reviewed  COMPREHENSIVE METABOLIC PANEL - Abnormal; Notable for the following components:      Result Value   Sodium 132 (*)    Chloride 96 (*)    Glucose, Bld 407 (*)    Calcium 8.6 (*)    Total Protein 6.2 (*)    Albumin 3.3 (*)    ALT 48 (*)    All other components within normal limits  BRAIN NATRIURETIC PEPTIDE - Abnormal; Notable for the following components:   B Natriuretic Peptide 862.0 (*)    All other components within normal limits  D-DIMER, QUANTITATIVE (NOT AT [facility]) - Abnormal; Notable for the following components:   D-Dimer, Quant 2.61 (*)    All other components within normal limits  TROPONIN I (HIGH SENSITIVITY) - Abnormal; Notable for the following components:   Troponin I (High Sensitivity) 34 (*)    All other components within normal limits  TROPONIN I (HIGH SENSITIVITY) - Abnormal; Notable for the following components:   Troponin I (High Sensitivity) 32 (*)    All other components within normal limits  SARS CORONAVIRUS 2 (TAT 6-24 HRS)  CBC WITH DIFFERENTIAL/PLATELET  LIPASE, BLOOD  POC SARS CORONAVIRUS 2 AG -  ED    EKG EKG Interpretation  Date/Time:  Saturday November 20 2019 09:20:45 EST Ventricular Rate:  123 PR Interval:    QRS Duration: 85 QT Interval:  336 QTC Calculation: 481 R Axis:   101 Text Interpretation: Sinus tachycardia Ventricular bigeminy Right  axis deviation Low voltage, extremity leads Probable anteroseptal infarct, old questionable atrial fibrillation Confirmed by Glynn Octave [phone]) on 11/20/2019 9:51:11 AM   Radiology CT Angio Chest PE W and/or Wo Contrast  Result Date: 11/20/2019 CLINICAL DATA:  Progressive short of breath. EXAM: CT ANGIOGRAPHY CHEST WITH CONTRAST TECHNIQUE: Multidetector CT imaging of the chest was performed using the standard protocol during bolus administration of intravenous contrast. Multiplanar CT image reconstructions and MIPs were obtained to evaluate the vascular anatomy. CONTRAST:  OMNIPAQUE IOHEXOL 350 MG/ML SOLN COMPARISON:  None. FINDINGS: Cardiovascular: No filling defects within the pulmonary arteries to suggest acute pulmonary embolism. No acute findings of the aorta or great vessels. No pericardial fluid. Mediastinum/Nodes: No axillary supraclavicular adenopathy. No mediastinal adenopathy no pericardial effusion. Lungs/Pleura: There is new bilateral moderate layering effusions. There is mild ground-glass opacities in the lungs suggesting edema. Centrilobular emphysema the upper lobes. There is interlobular septal thickening in the LEFT lower lobe and inferior lingula. Upper Abdomen: Limited view of the liver, kidneys, pancreas are unremarkable. Normal adrenal glands. Musculoskeletal: No aggressive osseous lesion. Review of the MIP images confirms the above findings. IMPRESSION: 1. No evidence acute pulmonary embolism. 2. Bilateral moderate volume layering pleural effusions with ground-glass opacity suggesting mild edema. Findings most suggestive congestive heart failure. 3. Mild atelectasis and interlobular septal thickening in the LEFT lower lobe. 4. No clear evidence of pulmonary infection. Electronically Signed   By: Genevive Bi M.D.   On: 11/20/2019 12:34   DG Chest Portable 1 View  Result Date: 11/20/2019 CLINICAL DATA:  Shortness of breath. EXAM: PORTABLE CHEST 1 VIEW COMPARISON:   November 02, 2019. FINDINGS: Stable cardiomegaly. Atherosclerosis of thoracic aorta is noted. Mild central pulmonary vascular congestion is noted. No pneumothorax is noted. Slightly improved bibasilar opacities are noted suggesting improving edema or infiltrates. Small left pleural effusion may be present. Bony thorax is unremarkable. IMPRESSION: Aortic atherosclerosis. Stable cardiomegaly with mild central pulmonary vascular congestion. Slightly improved bibasilar opacities are  noted suggesting improving edema or infiltrates. Small left pleural effusion may be present. Electronically Signed   By: Lupita Raider M.D.   On: 11/20/2019 09:48    Procedures Procedures (including critical care time)  Medications Ordered in ED Medications  albuterol (VENTOLIN HFA) 108 (90 Base) MCG/ACT inhaler 4 puff (4 puffs Inhalation Given 11/20/19 0956)  methylPREDNISolone sodium succinate (SOLU-MEDROL) 125 mg/2 mL injection 80 mg (80 mg Intravenous Given 11/20/19 1016)  furosemide (LASIX) injection 20 mg (20 mg Intravenous Given 11/20/19 1016)  diltiazem (CARDIZEM) injection 5 mg (5 mg Intravenous Given 11/20/19 1111)  iohexol (OMNIPAQUE) 350 MG/ML injection 100 mL (100 mLs Intravenous Contrast Given 11/20/19 1155)  diltiazem (CARDIZEM) injection 5 mg (5 mg Intravenous Given 11/20/19 1327)    ED Course  I have reviewed the triage vital signs and the nursing notes.  Pertinent labs & imaging results that were available during my care of the patient were reviewed by me and considered in my medical decision making (see chart for details).    MDM Rules/Calculators/A&P     CHA2DS2/VAS Stroke Risk Points      N/A >= 2 Points: High Risk  1 - 1.99 Points: Medium Risk  0 Points: Low Risk    A final score could not be computed because of missing components.: Last  Change: N/A     This score determines the patient's risk of having a stroke if the  patient has atrial fibrillation.     Patient's CHA2DS2/VAS score  is 2.                     Patient with expiratory wheezes throughout on arrival, breathing is labored.  Sats remain 100% on room air.  Will obtain chest x-ray, labs, low clinical concern for sepsis.  On recheck patient given albuterol neb and wheezing has improved.  Steroids also given.  Chest x-ray shows possible pulmonary edema.  Will give Lasix.  EKG also shows possible new onset A. fib, will give Cardizem.  Patient also seen by Dr. Manus Gunning and care plan discussed.  On second recheck, patient reports feeling better.  Urine output of approximately 1300 cc per nursing staff.  Patient appears to be resting comfortably, no respiratory distress.  Consulted hospitalist, Dr. Laural Benes who agrees to admit.    Final Clinical Impression(s) / ED Diagnoses Final diagnoses:  Atrial fibrillation with RVR (HCC)  Acute congestive heart failure, unspecified heart failure type [facility])    Rx / DC Orders ED Discharge Orders    None       Pauline Aus, PA-C 11/20/19 1333    Glynn Octave, MD 11/20/19 1940

## 2019-11-20 NOTE — ED Notes (Signed)
Swat nurse in to assess/admit

## 2019-11-20 NOTE — ED Notes (Signed)
Call to [facility] for med

## 2019-11-20 NOTE — ED Notes (Signed)
Lab in to collect   Pt reports he has trouble in some CTs He also reports no pain but feels his stomach is so tight he cannot breathe well

## 2019-11-20 NOTE — Progress Notes (Signed)
ANTICOAGULATION CONSULT NOTE - Initial Consult  Pharmacy Consult for Apixaban Indication: atrial fibrillation  No Known Allergies  Patient Measurements: Height: 5\' 9"  (175.3 cm) Weight: 175 lb (79.4 kg) IBW/kg (Calculated) : 70.7  Vital Signs: Temp: 97.5 F (36.4 C) (12/12 0921) Temp Source: Oral (12/12 0921) BP: 123/85 (12/12 1430) Pulse Rate: 96 (12/12 1430)  Labs: Recent Labs    11/20/19 0937 11/20/19 1111  HGB 15.4  --   HCT 49.1  --   PLT 230  --   CREATININE 0.94  --   TROPONINIHS 34* 32*    Estimated Creatinine Clearance: 85.7 mL/min (by C-G formula based on SCr of 0.94 mg/dL).   Medical History: Past Medical History:  Diagnosis Date  . Arthritis   . COPD (chronic obstructive pulmonary disease) ([place])   . Diabetes mellitus   . Hypertension     Medications:  See med rec  Assessment: 58 yo male presented to ED with SOB.  Recently discharged from [facility] last week where he reportedly was treated for pneumonia. He also has new onset afib, which pharmacy asked to dose eliquis.  Goal of Therapy:  Monitor platelets by anticoagulation protocol: Yes   Plan:  Eliquis 5mg  po bid Monitor for S/S of bleeding Educate on eliquis  Isac Sarna, BS Vena Austria, BCPS Clinical Pharmacist Pager [phone] 11/20/2019,3:55 PM

## 2019-11-20 NOTE — H&P (Signed)
History and Physical  [place] BTD:[id number] DOB: 02/09/1961 DOA: 11/20/2019  PCP: Monico Blitz, MD  Patient coming from: Home   I have personally briefly reviewed patient's old medical records in [place]  Chief Complaint: shortness of breath   HPI: Kevin Underwood is a 58 y.o. male with medical history significant of Hypertension, type 2 Diabetes mellitus, COPD, and arthritis was recently discharged from [facility] last week where he reportedly was treated for pneumonia.  He reports that he has recently tested negative for Covid 19. He says that he never completed the antibiotics that were prescribed because he was having side effects from the antibiotics.  He reports that he has no chest pain symptoms.  He mostly complains of the progressive shortness of breath.  He denies fever and chills.  He has been constipated.  He says that he ate laxatives last night and now he is having multiple bowel movements.  He is having RUQ abdominal pain.  He denies history of prior abdominal surgeries and still has gallbladder.  He denies nausea, fever and chills.  He reports that his right leg has been swelling in last several days.  He had been hospitalized at [facility] recently.   ED Course: He was noted to be tachycardic and had atrial fibrillation with RVR with HR in 110-120 range.  He had an elevated BNP 860, Na 132, glucose 407, Alb 3.3, Hg 15.4, WBC 10.3, platelet 230, D dimer - 2.61, HS troponin 32.  He was given IV lasix.  He was given several doses of IV cardizem and did not have to be started on an IV drip.  He had a CTA with no findings of PE but with pulmonary edema noted.  He is being admitted for further management.    Review of Systems: As per HPI otherwise 10 point review of systems negative.   Past Medical History:  Diagnosis Date  . Arthritis   . COPD (chronic obstructive pulmonary disease) ([place])   . Diabetes mellitus   . Hypertension      Past Surgical History:  Procedure Laterality Date  . BACK SURGERY    . FOOT SURGERY    . HAND SURGERY       reports that he has been smoking cigarettes. He has a 30.00 pack-year smoking history. He has never used smokeless tobacco. He reports current alcohol use. He reports that he does not use drugs.  No Known Allergies  Family History  Problem Relation Age of Onset  . Arthritis Other   . Lung disease Other   . Asthma Other   . Diabetes Other     Prior to Admission medications   Medication Sig Start Date End Date Taking? Authorizing Provider  ADVAIR DISKUS 250-50 MCG/DOSE AEPB Inhale 2 puffs into the lungs 2 (two) times daily. 09/14/15   [provider]  albuterol (PROVENTIL) (2.5 MG/3ML) 0.083% nebulizer solution Inhale 1 mL into the lungs 4 (four) times daily as needed. 10/17/15   [provider]  aspirin 81 MG tablet Take 81 mg by mouth daily.    [provider]  COMBIVENT RESPIMAT 20-100 MCG/ACT AERS respimat Inhale 2 Inhalers into the lungs as needed. 10/17/15   [provider]  diazepam (VALIUM) 5 MG tablet Take as directed prior to procedure 04/15/19   Marybelle Killings, MD  doxycycline (VIBRAMYCIN) 100 MG capsule Take 1 capsule (100 mg total) by mouth 2 (two) times daily. Patient not  taking: Reported on 11/28/2015 09/17/12   Vickki Hearing, MD  gabapentin (NEURONTIN) 300 MG capsule Take 300 mg by mouth daily.     [provider]  glimepiride (AMARYL) 2 MG tablet Take 2 mg by mouth daily. Reported on 11/28/2015    [provider]  HYDROcodone-acetaminophen (NORCO) 7.5-325 MG per tablet Take 1 tablet by mouth every 4 (four) hours as needed for pain. Patient not taking: Reported on 04/15/2019 10/26/12   Vickki Hearing, MD  HYDROcodone-acetaminophen (NORCO/VICODIN) 5-325 MG tablet  04/14/19   [provider]  ibuprofen (ADVIL) 800 MG tablet  04/14/19   [provider]  INVOKANA 100 MG TABS tablet Take 100  mg by mouth daily. 10/19/15   [provider]  lisinopril (PRINIVIL,ZESTRIL) 20 MG tablet Take 20 mg by mouth daily.    [provider]  metFORMIN (GLUCOPHAGE) 500 MG tablet Take 1,000 mg by mouth 2 (two) times daily with a meal.    [provider]  SYMBICORT 160-4.5 MCG/ACT inhaler TAKE 2 PUFFS BY MOUTH TWICE A DAY 03/30/19   [provider]  VICTOZA 18 MG/3ML SOPN Inject 1.2 Units into the skin daily. 09/05/15   [provider]    Physical Exam: Vitals:   11/20/19 1230 11/20/19 1300 11/20/19 1330 11/20/19 1400  BP: (!) 121/103 (!) 125/97 (!) 161/123 (!) 145/107  Pulse:    (!) 118  Resp: (!) 32 (!) 25 (!) 23 17  Temp:      TempSrc:      SpO2:    97%  Weight:      Height:       Constitutional: NAD, calm, comfortable, appears chronically ill and older than stated age.  Eyes: PERRL, lids and conjunctivae normal ENMT: Mucous membranes are moist. Posterior pharynx clear of any exudate or lesions.  poor dentition.  Neck: normal, supple, no masses, no thyromegaly Respiratory: clear to auscultation bilaterally, no wheezing, no crackles. Normal respiratory effort. No accessory muscle use.  Cardiovascular:  Irregularly irregular tachycardic, no murmurs / rubs / gallops. 2+ pitting edema RLE. 2+ pedal pulses. No carotid bruits.  Abdomen: (+) RUQ tenderness, no masses palpated. No hepatosplenomegaly. Bowel sounds positive.  Musculoskeletal: no clubbing / cyanosis. No joint deformity upper and lower extremities. Good ROM, no contractures. Normal muscle tone.  Skin: no rashes, lesions, ulcers. No induration Neurologic: CN 2-12 grossly intact. Sensation intact, DTR normal. Strength 5/5 in all 4.  Psychiatric: Normal judgment and insight. Alert and oriented x 3. Normal mood.   Labs on Admission: I have personally reviewed following labs and imaging studies  CBC: Recent Labs  Lab 11/20/19 0937  WBC 10.3  NEUTROABS 7.2  HGB 15.4  HCT 49.1  MCV 90.3   PLT 230   Basic Metabolic Panel: Recent Labs  Lab 11/20/19 0937  NA 132*  K 4.5  CL 96*  CO2 24  GLUCOSE 407*  BUN 16  CREATININE 0.94  CALCIUM 8.6*   GFR: Estimated Creatinine Clearance: 85.7 mL/min (by C-G formula based on SCr of 0.94 mg/dL). Liver Function Tests: Recent Labs  Lab 11/20/19 0937  AST 28  ALT 48*  ALKPHOS 92  BILITOT 0.6  PROT 6.2*  ALBUMIN 3.3*   Recent Labs  Lab 11/20/19 0937  LIPASE 30   No results for input(s): AMMONIA in the last 168 hours. Coagulation Profile: No results for input(s): INR, PROTIME in the last 168 hours. Cardiac Enzymes: No results for input(s): CKTOTAL, CKMB, CKMBINDEX, TROPONINI in the last  168 hours. BNP (last 3 results) No results for input(s): PROBNP in the last 8760 hours. HbA1C: No results for input(s): HGBA1C in the last 72 hours. CBG: No results for input(s): GLUCAP in the last 168 hours. Lipid Profile: No results for input(s): CHOL, HDL, LDLCALC, TRIG, CHOLHDL, LDLDIRECT in the last 72 hours. Thyroid Function Tests: No results for input(s): TSH, T4TOTAL, FREET4, T3FREE, THYROIDAB in the last 72 hours. Anemia Panel: No results for input(s): VITAMINB12, FOLATE, FERRITIN, TIBC, IRON, RETICCTPCT in the last 72 hours. Urine analysis: No results found for: COLORURINE, APPEARANCEUR, LABSPEC, PHURINE, GLUCOSEU, HGBUR, BILIRUBINUR, KETONESUR, PROTEINUR, UROBILINOGEN, NITRITE, LEUKOCYTESUR  Radiological Exams on Admission: CT Angio Chest PE W and/or Wo Contrast  Result Date: 11/20/2019 CLINICAL DATA:  Progressive short of breath. EXAM: CT ANGIOGRAPHY CHEST WITH CONTRAST TECHNIQUE: Multidetector CT imaging of the chest was performed using the standard protocol during bolus administration of intravenous contrast. Multiplanar CT image reconstructions and MIPs were obtained to evaluate the vascular anatomy. CONTRAST:  OMNIPAQUE IOHEXOL 350 MG/ML SOLN COMPARISON:  None. FINDINGS: Cardiovascular: No filling defects  within the pulmonary arteries to suggest acute pulmonary embolism. No acute findings of the aorta or great vessels. No pericardial fluid. Mediastinum/Nodes: No axillary supraclavicular adenopathy. No mediastinal adenopathy no pericardial effusion. Lungs/Pleura: There is new bilateral moderate layering effusions. There is mild ground-glass opacities in the lungs suggesting edema. Centrilobular emphysema the upper lobes. There is interlobular septal thickening in the LEFT lower lobe and inferior lingula. Upper Abdomen: Limited view of the liver, kidneys, pancreas are unremarkable. Normal adrenal glands. Musculoskeletal: No aggressive osseous lesion. Review of the MIP images confirms the above findings. IMPRESSION: 1. No evidence acute pulmonary embolism. 2. Bilateral moderate volume layering pleural effusions with ground-glass opacity suggesting mild edema. Findings most suggestive congestive heart failure. 3. Mild atelectasis and interlobular septal thickening in the LEFT lower lobe. 4. No clear evidence of pulmonary infection. Electronically Signed   By: Genevive Bi M.D.   On: 11/20/2019 12:34   DG Chest Portable 1 View  Result Date: 11/20/2019 CLINICAL DATA:  Shortness of breath. EXAM: PORTABLE CHEST 1 VIEW COMPARISON:  November 02, 2019. FINDINGS: Stable cardiomegaly. Atherosclerosis of thoracic aorta is noted. Mild central pulmonary vascular congestion is noted. No pneumothorax is noted. Slightly improved bibasilar opacities are noted suggesting improving edema or infiltrates. Small left pleural effusion may be present. Bony thorax is unremarkable. IMPRESSION: Aortic atherosclerosis. Stable cardiomegaly with mild central pulmonary vascular congestion. Slightly improved bibasilar opacities are noted suggesting improving edema or infiltrates. Small left pleural effusion may be present. Electronically Signed   By: Lupita Raider M.D.   On: 11/20/2019 09:48   EKG: Independently reviewed. Atrial  fibrillation  Assessment/Plan Principal Problem:   Atrial fibrillation with RVR (HCC) Active Problems:   Acute exacerbation of CHF (congestive heart failure) (HCC)   Acute respiratory distress   COPD (chronic obstructive pulmonary disease) (HCC)   Hypertension   Type 2 diabetes mellitus (HCC)  1. Newly discovered paroxysmal atrial fibrillation - Pt will be admitted for rate control management.  He will go to stepdown ICU.  Continue IV lopressor 5 mg every 6 hours, add diltiazem if needed, check 2D echocardiogram, check TSH. Monitor lytes.  Apixaban for anticoagulation per pharmD consult.  2. Acute respiratory distress with hypoxia - continue supplemental oxygen as needed, treating cardiac condition as above.  3. COPD - not in acute exacerbation, resuming home bronchodilators.  4. Essential hypertension - continue lopressor 5 mg IV every 6 hours  with holding parameters, altace 1.25 mg daily.  5. Type 2 diabetes mellitus with hyperglycemia - hold home oral medications, SSI coverage and prandial coverage ordered, follow and adjust therapy as needed.  6. RUQ abdominal pain - check US abdomen, consider CT abdomen if persists.  7. CHF exacerbation unknown if systolic or diastolic dysfunction - Pt has no known history of CHF, check 2D echocardiogram.  Continue IV lasix.  8. History of chronic alcoholism - Pt reports he stopped alcohol 6 months ago.  US abdomen to evaluate for signs of liver cirrhosis.   DVT prophylaxis: apixaban  Code Status: Full   Family Communication: patient updated at bedside   Disposition Plan: anticipate home when medically stable   Consults called:   Admission status: INP   Merranda Bolls MD [facility] How to contact the [facility] Attending or Consulting provider 7A - 7P or covering provider during after hours 7P -7A, for this patient?  1. Check the care team in [facility] and look for a) attending/consulting [facility] provider listed and b) the [facility] team listed 2. Log into  [email or web address] and use 's universal password to access. If you do not have the password, please contact the hospital operator. 3. Locate the [facility] provider you are looking for under [facility] and page to a number that you can be directly reached. 4. If you still have difficulty reaching the provider, please page the [facility] (Director on Call) for the Hospitalists listed on amion for assistance.   If 7PM-7AM, please contact night-coverage [email or web address] Password [email or web address]  11/20/2019, 2:20 PM

## 2019-11-20 NOTE — ED Notes (Signed)
Resting   Awaiting room ready

## 2019-11-20 NOTE — ED Notes (Signed)
Release of info signed   Faxed to [facility]

## 2019-11-20 NOTE — ED Notes (Signed)
ED Provider at bedside. 

## 2019-11-20 NOTE — ED Notes (Signed)
Pt out of bed to BR   States first BM in several days   Meal provided

## 2019-11-20 NOTE — ED Notes (Signed)
Pt out of bed to BR  Reports has not defecated in several days   "ate" laxatives (exlax) last night "and now they're working"

## 2019-11-20 NOTE — ED Notes (Signed)
Lab in to assess 

## 2019-11-20 NOTE — ED Notes (Signed)
Awaiting Rm ready

## 2019-11-20 NOTE — ED Notes (Signed)
Sister called Loralie Champagne nurse as "noone" would answer questions for her   Queries answered, number taken and given to patient that he might have her speak w him

## 2019-11-20 NOTE — ED Notes (Signed)
TT at bedside 

## 2019-11-20 NOTE — ED Notes (Signed)
TT in to discuss c pt results

## 2019-11-21 ENCOUNTER — Encounter (HOSPITAL_COMMUNITY): Payer: Self-pay | Admitting: Family Medicine

## 2019-11-21 ENCOUNTER — Inpatient Hospital Stay (HOSPITAL_COMMUNITY): Payer: 59

## 2019-11-21 DIAGNOSIS — I5021 Acute systolic (congestive) heart failure: Secondary | ICD-10-CM | POA: Diagnosis present

## 2019-11-21 DIAGNOSIS — I480 Paroxysmal atrial fibrillation: Secondary | ICD-10-CM | POA: Diagnosis present

## 2019-11-21 DIAGNOSIS — G4733 Obstructive sleep apnea (adult) (pediatric): Secondary | ICD-10-CM | POA: Diagnosis present

## 2019-11-21 DIAGNOSIS — I5023 Acute on chronic systolic (congestive) heart failure: Secondary | ICD-10-CM

## 2019-11-21 DIAGNOSIS — F1991 Other psychoactive substance use, unspecified, in remission: Secondary | ICD-10-CM

## 2019-11-21 DIAGNOSIS — E871 Hypo-osmolality and hyponatremia: Secondary | ICD-10-CM | POA: Diagnosis present

## 2019-11-21 DIAGNOSIS — R7989 Other specified abnormal findings of blood chemistry: Secondary | ICD-10-CM

## 2019-11-21 DIAGNOSIS — R739 Hyperglycemia, unspecified: Secondary | ICD-10-CM

## 2019-11-21 DIAGNOSIS — F1011 Alcohol abuse, in remission: Secondary | ICD-10-CM

## 2019-11-21 DIAGNOSIS — I429 Cardiomyopathy, unspecified: Secondary | ICD-10-CM

## 2019-11-21 DIAGNOSIS — I361 Nonrheumatic tricuspid (valve) insufficiency: Secondary | ICD-10-CM

## 2019-11-21 DIAGNOSIS — I340 Nonrheumatic mitral (valve) insufficiency: Secondary | ICD-10-CM

## 2019-11-21 DIAGNOSIS — R251 Tremor, unspecified: Secondary | ICD-10-CM

## 2019-11-21 DIAGNOSIS — R600 Localized edema: Secondary | ICD-10-CM

## 2019-11-21 DIAGNOSIS — Z87898 Personal history of other specified conditions: Secondary | ICD-10-CM

## 2019-11-21 DIAGNOSIS — R451 Restlessness and agitation: Secondary | ICD-10-CM | POA: Diagnosis not present

## 2019-11-21 DIAGNOSIS — R9431 Abnormal electrocardiogram [ECG] [EKG]: Secondary | ICD-10-CM

## 2019-11-21 DIAGNOSIS — E8779 Other fluid overload: Secondary | ICD-10-CM

## 2019-11-21 LAB — CBC WITH DIFFERENTIAL/PLATELET
Abs Immature Granulocytes: 0.07 10*3/uL (ref 0.00–0.07)
Basophils Absolute: 0.1 10*3/uL (ref 0.0–0.1)
Basophils Relative: 0 %
Eosinophils Absolute: 0 10*3/uL (ref 0.0–0.5)
Eosinophils Relative: 0 %
HCT: 53.8 % — ABNORMAL HIGH (ref 39.0–52.0)
Hemoglobin: 16.9 g/dL (ref 13.0–17.0)
Immature Granulocytes: 1 %
Lymphocytes Relative: 20 %
Lymphs Abs: 2.9 10*3/uL (ref 0.7–4.0)
MCH: 28.4 pg (ref 26.0–34.0)
MCHC: 31.4 g/dL (ref 30.0–36.0)
MCV: 90.3 fL (ref 80.0–100.0)
Monocytes Absolute: 1.4 10*3/uL — ABNORMAL HIGH (ref 0.1–1.0)
Monocytes Relative: 10 %
Neutro Abs: 10.1 10*3/uL — ABNORMAL HIGH (ref 1.7–7.7)
Neutrophils Relative %: 69 %
Platelets: 277 10*3/uL (ref 150–400)
RBC: 5.96 MIL/uL — ABNORMAL HIGH (ref 4.22–5.81)
RDW: 14.2 % (ref 11.5–15.5)
WBC: 14.5 10*3/uL — ABNORMAL HIGH (ref 4.0–10.5)
nRBC: 0 % (ref 0.0–0.2)

## 2019-11-21 LAB — RAPID URINE DRUG SCREEN, HOSP PERFORMED
Amphetamines: NOT DETECTED
Barbiturates: NOT DETECTED
Benzodiazepines: POSITIVE — AB
Cocaine: NOT DETECTED
Opiates: NOT DETECTED
Tetrahydrocannabinol: NOT DETECTED

## 2019-11-21 LAB — GLUCOSE, CAPILLARY
Glucose-Capillary: 168 mg/dL — ABNORMAL HIGH (ref 70–99)
Glucose-Capillary: 238 mg/dL — ABNORMAL HIGH (ref 70–99)
Glucose-Capillary: 106 mg/dL — ABNORMAL HIGH (ref 70–99)
Glucose-Capillary: 66 mg/dL — ABNORMAL LOW (ref 70–99)
Glucose-Capillary: 83 mg/dL (ref 70–99)
Glucose-Capillary: 74 mg/dL (ref 70–99)

## 2019-11-21 LAB — BASIC METABOLIC PANEL
Anion gap: 14 (ref 5–15)
BUN: 30 mg/dL — ABNORMAL HIGH (ref 6–20)
CO2: 19 mmol/L — ABNORMAL LOW (ref 22–32)
Calcium: 8.4 mg/dL — ABNORMAL LOW (ref 8.9–10.3)
Chloride: 101 mmol/L (ref 98–111)
Creatinine, Ser: 1.04 mg/dL (ref 0.61–1.24)
GFR calc Af Amer: >60 mL/min (ref >60)
GFR calc non Af Amer: >60 mL/min (ref >60)
Glucose, Bld: 181 mg/dL — ABNORMAL HIGH (ref 70–99)
Potassium: 4.7 mmol/L (ref 3.5–5.1)
Sodium: 134 mmol/L — ABNORMAL LOW (ref 135–145)

## 2019-11-21 LAB — ECHOCARDIOGRAM COMPLETE
Height: 69 in
Weight: 2800 oz

## 2019-11-21 LAB — LIPID PANEL
Cholesterol: 175 mg/dL (ref 0–200)
HDL: 45 mg/dL (ref >40)
LDL Cholesterol: 116 mg/dL — ABNORMAL HIGH (ref 0–99)
Total CHOL/HDL Ratio: 3.9 RATIO
Triglycerides: 70 mg/dL (ref <150)
VLDL: 14 mg/dL (ref 0–40)

## 2019-11-21 LAB — MAGNESIUM: Magnesium: 2.4 mg/dL (ref 1.7–2.4)

## 2019-11-21 LAB — BRAIN NATRIURETIC PEPTIDE: B Natriuretic Peptide: 1285 pg/mL — ABNORMAL HIGH (ref 0.0–100.0)

## 2019-11-21 LAB — SARS CORONAVIRUS 2 (TAT 6-24 HRS): SARS Coronavirus 2: NEGATIVE

## 2019-11-21 LAB — ETHANOL: Alcohol, Ethyl (B): <10 mg/dL (ref <10)

## 2019-11-21 MED ORDER — FUROSEMIDE 10 MG/ML IJ SOLN
40.0000 mg | Freq: Two times a day (BID) | INTRAMUSCULAR | Status: DC
  Administered 2019-11-21 – 2019-11-23 (×5): 40 mg via INTRAVENOUS
  Filled 2019-11-21 (×5): qty 4

## 2019-11-21 MED ORDER — FUROSEMIDE 10 MG/ML IJ SOLN: 20.0000 mg | Freq: Three times a day (TID) | INTRAMUSCULAR | Status: DC

## 2019-11-21 MED ORDER — HALOPERIDOL LACTATE 5 MG/ML IJ SOLN
5.0000 mg | Freq: Four times a day (QID) | INTRAMUSCULAR | Status: DC | PRN
  Administered 2019-11-24: 5 mg via INTRAVENOUS
  Filled 2019-11-21: qty 1

## 2019-11-21 MED ORDER — INSULIN ASPART 100 UNIT/ML ~~LOC~~ SOLN
2.0000 [IU] | Freq: Three times a day (TID) | SUBCUTANEOUS | Status: DC
  Administered 2019-11-22 (×3): 2 [IU] via SUBCUTANEOUS

## 2019-11-21 MED ORDER — INSULIN GLARGINE 100 UNIT/ML ~~LOC~~ SOLN
10.0000 [IU] | Freq: Every day | SUBCUTANEOUS | Status: DC
  Filled 2019-11-21: qty 0.1

## 2019-11-21 MED ORDER — NICOTINE 21 MG/24HR TD PT24: 21.0000 mg | MEDICATED_PATCH | Freq: Every day | TRANSDERMAL | Status: DC | PRN

## 2019-11-21 MED ORDER — LORAZEPAM 2 MG/ML IJ SOLN
2.0000 mg | Freq: Once | INTRAMUSCULAR | Status: AC
  Administered 2019-11-21: 2 mg via INTRAVENOUS
  Filled 2019-11-21: qty 1

## 2019-11-21 MED ORDER — HALOPERIDOL LACTATE 5 MG/ML IJ SOLN
2.0000 mg | Freq: Once | INTRAMUSCULAR | Status: AC
  Administered 2019-11-21: 2 mg via INTRAMUSCULAR
  Filled 2019-11-21: qty 1

## 2019-11-21 MED ORDER — METOPROLOL TARTRATE 25 MG PO TABS
25.0000 mg | ORAL_TABLET | Freq: Four times a day (QID) | ORAL | Status: DC
  Administered 2019-11-21 – 2019-11-22 (×3): 25 mg via ORAL
  Filled 2019-11-21 (×3): qty 1

## 2019-11-21 MED ORDER — LORAZEPAM 2 MG/ML IJ SOLN
2.0000 mg | INTRAMUSCULAR | Status: DC | PRN
  Administered 2019-11-21 – 2019-11-23 (×4): 2 mg via INTRAVENOUS
  Filled 2019-11-21 (×4): qty 1

## 2019-11-21 MED ORDER — METOPROLOL TARTRATE 25 MG PO TABS: 25.0000 mg | ORAL_TABLET | Freq: Four times a day (QID) | ORAL | Status: DC

## 2019-11-21 NOTE — Progress Notes (Signed)
Notified Hospitalist due to increased anxiety and constantly trying to get out of bed. Severe anxiety and restlessness with vitals WNL. Earlier order for antianxiety medication completed. Little to no effect. Breathing treatment obtained due to patient request. Patient has moist skin and fidgets constantly.  Orders received for medications (given)Physician came to room. Pt continues to attempt to get out of bed and remove monitors. RN remaining in room with patient

## 2019-11-21 NOTE — Progress Notes (Signed)
*  PRELIMINARY RESULTS* Echocardiogram 2D Echocardiogram has been performed.  Kevin Underwood 11/21/2019, 9:25 AM

## 2019-11-21 NOTE — Progress Notes (Signed)
Nurse tech was watching patient breath while sleeping. Patient would have 50 second pauses while breathing, would awaken with a gasp.

## 2019-11-21 NOTE — Progress Notes (Signed)
PROGRESS NOTE [facility]   Kevin Underwood  UJW:[id number]  DOB: [birth year]  DOA: 11/20/2019 PCP: Kirstie Peri, MD   Brief Admission Hx: 58 y.o. male with medical history significant of Hypertension, type 2 Diabetes mellitus, COPD, and arthritis was recently discharged from [facility] last week where he reportedly was treated for pneumonia.  He presented with nonspecific complaints of progressive shortness of breath and swelling in the lower extremities.  He also complained of right upper quadrant abdominal pain.  MDM/Assessment & Plan:   1. Newly discovered cardiomyopathy-not sure if this is related to his chronic alcohol history versus ischemia.  His troponins have been flat.  His cardiac echo revealed EF less than 20%.  I called and discussed with cardiologist on-call and he recommended making some adjustments to his medication regimen.  Metoprolol changed to 25 mg p.o. every 6 hours as blood pressure tolerates.  DC Cardizem.  Change IV Lasix to 40 mg every 12 hours if BP tolerates.  The inpatient cardiology service will see him in the morning and further recommendations to follow. 2. Atrial fibrillation with RVR-his heart rate responded well to Lopressor 5 mg IV every 6 hours plus oral diltiazem.  This has been discontinued and we will control his rate with the metoprolol as ordered above now that we have discovered his severe cardiomyopathy.  He is on apixaban for full anticoagulation.   3. Paroxysmal atrial fibrillation - confirmed by ECG, he has been started on metoprolol with good results.  Apixaban dosed by pharm D for full anticoagulation.   4. Acute systolic CHF exacerbation-he is responding well to IV Lasix.  I spoke with cardiologist on call and he is recommended increasing Lasix to 40 mg IV every 12 hours.  Continue to monitor intake and output closely and daily weights.  Monitor blood pressures closely. 5. Poorly controlled type 2 diabetes mellitus with hyperglycemia  -as evidenced by hemoglobin A1c of 12.1% his glycemic control is slowly improving with insulin treatment.  Continue SSI coverage, CBG testing 5 times per day and begin diabetes education when medically stable. 6. Acute respiratory distress with hypoxia - continue supplemental oxygen.   7. Essential hypertension-he has had mostly softer blood pressures however has not become hypotensive on current treatment we will continue to monitor closely.   8. Right upper quadrant abdominal pain-abdominal ultrasound obtained and there is no signs of acute cholecystitis he does have cholelithiasis present.  He has thickened walls in the gallbladder. 9. COPD-he does not appear to be in acute exacerbation at this time.  He has bronchodilators ordered.  He received IV solumedrol in ED.  10. OSA-diagnosis made by clinical observation as he was seen to have apnea episodes up to 50 seconds at a time while sleeping.  Auto titrated CPAP ordered to use while in the hospital.  He will need to have an official sleep study completed after he has been medically stabilized and discharged. 11. History of chronic alcoholism-the patient is adamant and reports that he stopped alcohol consumption 6 months ago.  He has been having restlessness and tremulousness since arrival and we are monitoring him closely for alcohol withdrawal.  His EtOH level has been negative. 12. Urinary retention - In/out cath ordered as needed, may require foley if not urinating better on his own.  13. Restlessness and agitation - Unsure where this is coming from, ordered for UDS, ETOH level, lorazepam and haldol ordered as needed.  14. Leukocytosis - He received a dose of steroids  in the ED on 11/20/19.  He does not have any active findings of infection at this time.    DVT prophylaxis: apixaban Code Status: Full  Family Communication: tried to contact mother but no answer Disposition Plan: continue stepdown ICU status    Consultants:  [facility] consult  cardiology  Inpatient cardiology starting 12/14  Procedures:    Antimicrobials:     Subjective: Pt had restlessness and agitation all night long, he got more ativan this morning and he is starting to settle down more.  Care team noticed that he has brief periods of apnea of about 50 seconds while sleeping.  He had to have in/out cath due to not urinating.    Objective: Vitals:   11/21/19 1300 11/21/19 1344 11/21/19 1400 11/21/19 1500  BP: (!) 117/96  107/78 109/76  Pulse: 84  80 80  Resp: (!) 33  17 18  Temp:      TempSrc:      SpO2: 100% 100% 100% 100%  Weight:      Height:        Intake/Output Summary (Last 24 hours) at 11/21/2019 1528 Last data filed at 11/21/2019 1230 Gross per 24 hour  Intake --  Output 1100 ml  Net -1100 ml   Filed Weights   11/20/19 0919  Weight: 79.4 kg    REVIEW OF SYSTEMS  UTO as he was just medicated, sedated  Exam:  General exam: chronically ill appearing, he appears older than stated age, he is sedated and sleeping, he is arousable.  Respiratory system: bibasilar crackles heard. No increased work of breathing. Cardiovascular system: irregularly irregular, normal S1 & S2 heard. Mild JVD, 1+ pedal edema. Gastrointestinal system: Abdomen is protuberant distended, soft and mild RUQ tenderness. Normal bowel sounds heard. Central nervous system: somnolent. No focal neurological deficits. Extremities: pitting edema RLE, trace pretibial edema LLE.  Data Reviewed: Basic Metabolic Panel: Recent Labs  Lab 11/20/19 0937 11/21/19 0513  NA 132* 134*  K 4.5 4.7  CL 96* 101  CO2 24 19*  GLUCOSE 407* 181*  BUN 16 30*  CREATININE 0.94 1.04  CALCIUM 8.6* 8.4*  MG  --  2.4   Liver Function Tests: Recent Labs  Lab 11/20/19 0937  AST 28  ALT 48*  ALKPHOS 92  BILITOT 0.6  PROT 6.2*  ALBUMIN 3.3*   Recent Labs  Lab 11/20/19 0937  LIPASE 30   No results for input(s): AMMONIA in the last 168 hours. CBC: Recent Labs  Lab  11/20/19 0937 11/21/19 0513  WBC 10.3 14.5*  NEUTROABS 7.2 10.1*  HGB 15.4 16.9  HCT 49.1 53.8*  MCV 90.3 90.3  PLT 230 277   Cardiac Enzymes: No results for input(s): CKTOTAL, CKMB, CKMBINDEX, TROPONINI in the last 168 hours. CBG (last 3)  Recent Labs    11/21/19 0258 11/21/19 0734 11/21/19 1133  GLUCAP 168* 238* 106*   Recent Results (from the past 240 hour(s))  SARS CORONAVIRUS 2 (TAT 6-24 HRS) Nasopharyngeal Nasopharyngeal Swab     Status: None   Collection Time: 11/20/19  1:06 PM   Specimen: Nasopharyngeal Swab  Result Value Ref Range Status   SARS Coronavirus 2 NEGATIVE NEGATIVE Final    Comment: (NOTE) SARS-CoV-2 target nucleic acids are NOT DETECTED. The SARS-CoV-2 RNA is generally detectable in upper and lower respiratory specimens during the acute phase of infection. Negative results do not preclude SARS-CoV-2 infection, do not rule out co-infections with other pathogens, and should not be used as the sole basis  for treatment or other patient management decisions. Negative results must be combined with clinical observations, patient history, and epidemiological information. The expected result is Negative. Fact Sheet for Patients: HairSlick.no Fact Sheet for Healthcare Providers: quierodirigir.com This test is not yet approved or cleared by the [place] FDA and  has been authorized for detection and/or diagnosis of SARS-CoV-2 by FDA under an Emergency Use Authorization (EUA). This EUA will remain  in effect (meaning this test can be used) for the duration of the COVID-19 declaration under Section 56 4(b)(1) of the Act, 21 U.S.C. section 360bbb-3(b)(1), unless the authorization is terminated or revoked sooner. Performed at [facility], [address] [place], [place] [address]   MRSA PCR Screening     Status: None   Collection Time: 11/20/19  3:17 PM   Specimen: Nasopharyngeal Swab  Result  Value Ref Range Status   MRSA by PCR NEGATIVE NEGATIVE Final    Comment:        The GeneXpert MRSA Assay (FDA approved for NASAL specimens only), is one component of a comprehensive MRSA colonization surveillance program. It is not intended to diagnose MRSA infection nor to guide or monitor treatment for MRSA infections. Performed at [facility], [address], [place], [place] [address]      Studies: CT Angio Chest PE W and/or Wo Contrast  Result Date: 11/20/2019 CLINICAL DATA:  Progressive short of breath. EXAM: CT ANGIOGRAPHY CHEST WITH CONTRAST TECHNIQUE: Multidetector CT imaging of the chest was performed using the standard protocol during bolus administration of intravenous contrast. Multiplanar CT image reconstructions and MIPs were obtained to evaluate the vascular anatomy. CONTRAST:  OMNIPAQUE IOHEXOL 350 MG/ML SOLN COMPARISON:  None. FINDINGS: Cardiovascular: No filling defects within the pulmonary arteries to suggest acute pulmonary embolism. No acute findings of the aorta or great vessels. No pericardial fluid. Mediastinum/Nodes: No axillary supraclavicular adenopathy. No mediastinal adenopathy no pericardial effusion. Lungs/Pleura: There is new bilateral moderate layering effusions. There is mild ground-glass opacities in the lungs suggesting edema. Centrilobular emphysema the upper lobes. There is interlobular septal thickening in the LEFT lower lobe and inferior lingula. Upper Abdomen: Limited view of the liver, kidneys, pancreas are unremarkable. Normal adrenal glands. Musculoskeletal: No aggressive osseous lesion. Review of the MIP images confirms the above findings. IMPRESSION: 1. No evidence acute pulmonary embolism. 2. Bilateral moderate volume layering pleural effusions with ground-glass opacity suggesting mild edema. Findings most suggestive congestive heart failure. 3. Mild atelectasis and interlobular septal thickening in the LEFT lower lobe. 4. No clear evidence of  pulmonary infection. Electronically Signed   By: Genevive Bi M.D.   On: 11/20/2019 12:34   US Abdomen Complete  Result Date: 11/21/2019 CLINICAL DATA:  Right upper quadrant pain. EXAM: ABDOMEN ULTRASOUND COMPLETE COMPARISON:  None. FINDINGS: Gallbladder: Gallbladder wall is thickened to 6-7 mm. Gallbladder mild to moderately distended. No stones. Common bile duct: Diameter: 5 mm Liver: Borderline enlarged. Normal parenchymal echogenicity. No mass or focal lesion. Portal vein is patent on color Doppler imaging with normal direction of blood flow towards the liver. IVC: No abnormality visualized. Pancreas: Obscured by midline bowel gas. Spleen: Size and appearance within normal limits. Right Kidney: Length: 13.3 cm.  There is Left Kidney: Length: 12.3 cm. Echogenicity within normal limits. No mass or hydronephrosis visualized. Abdominal aorta: No aneurysm visualized. Other findings: Ascites seen adjacent to the liver and spleen and gallbladder. Small right pleural effusion. IMPRESSION: 1. No acute findings. 2. Gallbladder wall thickened, but this is nonspecific in  the setting of ascites. No gallstones. No findings to suggest acute cholecystitis. 3. Least a small amount of ascites. The liver is borderline enlarged but otherwise normal in appearance. There is no splenomegaly. 4. Pancreas not evaluated, obscured by overlying bowel gas. Electronically Signed   By: Amie Portland M.D.   On: 11/21/2019 12:42   US Venous Img Lower Unilateral Right (DVT)  Result Date: 11/21/2019 CLINICAL DATA:  Right lower extremity swelling. EXAM: Right LOWER EXTREMITY VENOUS DOPPLER ULTRASOUND TECHNIQUE: Gray-scale sonography with graded compression, as well as color Doppler and duplex ultrasound were performed to evaluate the lower extremity deep venous systems from the level of the common femoral vein and including the common femoral, femoral, profunda femoral, popliteal and calf veins including the posterior tibial,  peroneal and gastrocnemius veins when visible. The superficial great saphenous vein was also interrogated. Spectral Doppler was utilized to evaluate flow at rest and with distal augmentation maneuvers in the common femoral, femoral and popliteal veins. COMPARISON:  None. FINDINGS: Contralateral Common Femoral Vein: Respiratory phasicity is normal and symmetric with the symptomatic side. No evidence of thrombus. Normal compressibility. Common Femoral Vein: No evidence of thrombus. Normal compressibility, respiratory phasicity and response to augmentation. Saphenofemoral Junction: No evidence of thrombus. Normal compressibility and flow on color Doppler imaging. Profunda Femoral Vein: No evidence of thrombus. Normal compressibility and flow on color Doppler imaging. Femoral Vein: No evidence of thrombus. Normal compressibility, respiratory phasicity and response to augmentation. Popliteal Vein: No evidence of thrombus. Normal compressibility, respiratory phasicity and response to augmentation. Calf Veins: No evidence of thrombus. Normal compressibility and flow on color Doppler imaging. Superficial Great Saphenous Vein: No evidence of thrombus. Normal compressibility. Venous Reflux:  None. Other Findings:  None. IMPRESSION: No definite evidence of deep venous thrombosis seen in right lower extremity. Electronically Signed   By: Lupita Raider M.D.   On: 11/21/2019 12:44   DG Chest Port 1 View  Result Date: 11/21/2019 CLINICAL DATA:  Acute exacerbation of CHF (congestive heart failure) EXAM: PORTABLE CHEST 1 VIEW COMPARISON:  11/20/2019 FINDINGS: Stable large cardiac silhouette. Chronic bronchitic markings. No focal infiltrate. Mild LEFT basilar atelectasis and potential small effusion. Some improvement in venous congestion seen on prior. IMPRESSION: 1. Some improvement in congestive heart failure pattern. 2. Persistent cardiomegaly and small LEFT effusion. Electronically Signed   By: Genevive Bi M.D.   On:  11/21/2019 10:58   DG Chest Portable 1 View  Result Date: 11/20/2019 CLINICAL DATA:  Shortness of breath. EXAM: PORTABLE CHEST 1 VIEW COMPARISON:  November 02, 2019. FINDINGS: Stable cardiomegaly. Atherosclerosis of thoracic aorta is noted. Mild central pulmonary vascular congestion is noted. No pneumothorax is noted. Slightly improved bibasilar opacities are noted suggesting improving edema or infiltrates. Small left pleural effusion may be present. Bony thorax is unremarkable. IMPRESSION: Aortic atherosclerosis. Stable cardiomegaly with mild central pulmonary vascular congestion. Slightly improved bibasilar opacities are noted suggesting improving edema or infiltrates. Small left pleural effusion may be present. Electronically Signed   By: Lupita Raider M.D.   On: 11/20/2019 09:48   ECHOCARDIOGRAM COMPLETE  Result Date: 11/21/2019   ECHOCARDIOGRAM REPORT   Patient Name:   Kevin Underwood Date of Exam: 11/21/2019 Medical Rec #:  [id number]        Height:       69.0 in Accession #:    [id number]       Weight:       175.0 lb Date of Birth:  1960-12-19  BSA:          1.95 m Patient Age:    54 years         BP:           104/87 mmHg Patient Gender: M                HR:           91 bpm. Exam Location:  [facility] Procedure: 2D Echo Indications:    Chest Pain 786.50 / R07.9  History:        Patient has no prior history of Echocardiogram examinations.                 CHF, COPD, Arrythmias:Atrial Fibrillation; Risk                 Factors:Diabetes, Current Smoker and Hypertension.  Sonographer:    Leavy Cella RDCS (AE) Referring Phys: [place]  1. Left ventricular ejection fraction, by visual estimation, is <20%. The left ventricle has severely decreased function. There is no left ventricular hypertrophy.  2. Left ventricular diastolic parameters are indeterminate.  3. The left ventricle demonstrates global hypokinesis.  4. Global right ventricle has mildly reduced systolic  function.The right ventricular size is mildly enlarged. No increase in right ventricular wall thickness.  5. Left atrial size was moderately dilated.  6. Right atrial size was severely dilated.  7. The mitral valve is abnormal. Mild mitral valve regurgitation.  8. The tricuspid valve is grossly normal. Tricuspid valve regurgitation is mild.  9. The aortic valve is tricuspid. Aortic valve regurgitation is not visualized. 10. The pulmonic valve was grossly normal. Pulmonic valve regurgitation is trivial. 11. Moderately elevated pulmonary artery systolic pressure. 12. The inferior vena cava is dilated in size with <50% respiratory variability, suggesting right atrial pressure of 15 mmHg. FINDINGS  Left Ventricle: Left ventricular ejection fraction, by visual estimation, is <20%. The left ventricle has severely decreased function. The left ventricle demonstrates global hypokinesis. There is no left ventricular hypertrophy. Left ventricular diastolic parameters are indeterminate. Right Ventricle: The right ventricular size is mildly enlarged. No increase in right ventricular wall thickness. Global RV systolic function is has mildly reduced systolic function. The tricuspid regurgitant velocity is 2.97 m/s, and with an assumed right atrial pressure of 15 mmHg, the estimated right ventricular systolic pressure is moderately elevated at 50.3 mmHg. Left Atrium: Left atrial size was moderately dilated. Right Atrium: Right atrial size was severely dilated Pericardium: There is no evidence of pericardial effusion. Mitral Valve: The mitral valve is abnormal. There is mild thickening of the mitral valve leaflet(s). Mild mitral valve regurgitation. Tricuspid Valve: The tricuspid valve is grossly normal. Tricuspid valve regurgitation is mild. Aortic Valve: The aortic valve is tricuspid. Aortic valve regurgitation is not visualized. Pulmonic Valve: The pulmonic valve was grossly normal. Pulmonic valve regurgitation is trivial.  Pulmonic regurgitation is trivial. Aorta: The aortic root and ascending aorta are structurally normal, with no evidence of dilitation. Venous: The inferior vena cava is dilated in size with less than 50% respiratory variability, suggesting right atrial pressure of 15 mmHg. IAS/Shunts: No atrial level shunt detected by color flow Doppler.  LEFT VENTRICLE PLAX 2D LVIDd:         5.18 cm  Diastology LVIDs:         4.54 cm  LV e' lateral: 5.57 cm/s LV PW:         1.46 cm  LV e' medial:  3.23  cm/s LV IVS:        1.05 cm LVOT diam:     2.00 cm LV SV:         34 ml LV SV Index:   17.18 LVOT Area:     3.14 cm  RIGHT VENTRICLE RV S prime:     9.96 cm/s TAPSE (M-mode): 1.1 cm LEFT ATRIUM              Index       RIGHT ATRIUM           Index LA diam:        4.50 cm  2.31 cm/m  RA Area:     25.00 cm LA Vol (A2C):   103.0 ml 52.77 ml/m RA Volume:   95.20 ml  48.77 ml/m LA Vol (A4C):   76.4 ml  39.14 ml/m LA Biplane Vol: 92.2 ml  47.23 ml/m   AORTA Ao Root diam: 3.50 cm MR Peak grad: 71.9 mmHg   TRICUSPID VALVE MR Mean grad: 47.0 mmHg   TR Peak grad:   35.3 mmHg MR Vmax:      424.00 cm/s TR Vmax:        297.00 cm/s MR Vmean:     321.0 cm/s                           SHUNTS                           Systemic Diam: 2.00 cm  Zoila Shutter MD Electronically signed by Zoila Shutter MD Signature Date/Time: 11/21/2019/11:01:15 AM    Final      Scheduled Meds: . apixaban  5 mg Oral BID  . Chlorhexidine Gluconate Cloth  6 each Topical Q0600  . furosemide  40 mg Intravenous Q12H  . insulin aspart  0-15 Units Subcutaneous TID WC  . insulin aspart  0-5 Units Subcutaneous QHS  . insulin aspart  4 Units Subcutaneous TID WC  . insulin glargine  15 Units Subcutaneous Daily  . ipratropium-albuterol  3 mL Inhalation BID  . metoprolol tartrate  25 mg Oral Q6H  . mometasone-formoterol  2 puff Inhalation BID  . potassium chloride  10 mEq Oral BID  . ramipril  1.25 mg Oral Daily   Continuous Infusions:  Principal Problem:    Atrial fibrillation with RVR (HCC) Active Problems:   Acute exacerbation of CHF (congestive heart failure) (HCC)   Acute respiratory distress   COPD (chronic obstructive pulmonary disease) (HCC)   Hypertension   Type 2 diabetes mellitus (HCC)   Cardiomyopathy (HCC)   Acute systolic CHF (congestive heart failure) (HCC)   History of alcohol abuse Quit 6 mos ago   History of recreational drug use   PAF (paroxysmal atrial fibrillation) (HCC)   Cardiac volume overload   Elevated brain natriuretic peptide (BNP) level   Edema of right lower leg   Hyponatremia   Abnormal electrocardiogram (ECG) (EKG)   Hyperglycemia   Tremulousness   Restlessness and agitation  Critical Care Procedure Note Authorized and Performed by: Maryln Manuel MD  Total Critical Care time:  45 minutes  Due to a high probability of clinically significant, life threatening deterioration, the patient required my highest level of preparedness to intervene emergently and I personally spent this critical care time directly and personally managing the patient.  This critical care time included obtaining a history; examining the patient, pulse oximetry;  ordering and review of studies; arranging urgent treatment with development of a management plan; evaluation of patient's response of treatment; frequent reassessment; and discussions with other providers.  This critical care time was performed to assess and manage the high probability of imminent and life threatening deterioration that could result in multi-organ failure.  It was exclusive of separately billable procedures and treating other patients and teaching time.   Standley Dakinslanford Gaytha Raybourn, MD [facility] 11/21/2019, 3:28 PM    LOS: 1 day  How to contact the [facility] Attending or Consulting provider 7A - 7P or covering provider during after hours 7P -7A, for this patient?  1. Check the care team in [facility] and look for a) attending/consulting [facility] provider listed and b) the [facility] team  listed 2. Log into [email or web address] and use [place]'s universal password to access. If you do not have the password, please contact the hospital operator. 3. Locate the [facility] provider you are looking for under [facility] and page to a number that you can be directly reached. 4. If you still have difficulty reaching the provider, please page the [facility] (Director on Call) for the Hospitalists listed on amion for assistance.

## 2019-11-21 NOTE — Progress Notes (Signed)
Notified Dr Wynetta Emery of patient complaint of feeling short of breath, O2 sat 82% on room air and increased restlessness with patient unable to get comfortable and getting in and out of the bed. Dr Wynetta Emery made aware of BNP level this AM being 1,285. Patient placed on 2L O2 via [place] with O2 sats now 98%. Bladder scan performed with 0 ml in bladder noted. Dr Wynetta Emery made aware.

## 2019-11-21 NOTE — Progress Notes (Signed)
Pt placed on CPAP due to witnessed apnea during napping

## 2019-11-21 NOTE — Progress Notes (Signed)
Hypoglycemic Event  CBG: 66  Treatment: Given 4oz of orange juice  Symptoms: None  Follow-up CBG: Time:1730 CBG Result: 83  Possible Reasons for Event: increased metabolic demand  Comments/MD notified: Dr Bonnee Quin

## 2019-11-21 NOTE — Progress Notes (Signed)
Pt pulled Cpap off and mild sweating noticed on forehead and neck area. When asked if he knew where he was, he would not answer. Applied mitts, bed alarm is on. PRN med given earlier. Pt continues to be agitated and moving towards edge of bed.

## 2019-11-21 NOTE — Progress Notes (Signed)
Patient unable to void since 7am, attempted to void on own x3 with no success. Patient continues to be restless and complaining that he feels like he has to urinate but is unable to. Bladder scan done with result >300. Dr Wynetta Emery made aware. IN and OUT cath done with result of 444ml output. Dr. Wynetta Emery aware. Patient then sat on commode and had a small BM. Patient states he feels a little better.

## 2019-11-21 NOTE — Progress Notes (Signed)
Rectal temp 97.0, Dr Wynetta Emery made aware. Patient placed on Cpap this afternoon due to restlessness and patient having sleep apnea and patient hasn't slept much last night and today and wanted to rest. Restlessness and breathing noted to improved with Cpap use. Dr Wynetta Emery made aware.

## 2019-11-22 ENCOUNTER — Encounter (HOSPITAL_COMMUNITY): Payer: Self-pay | Admitting: Family Medicine

## 2019-11-22 DIAGNOSIS — G4733 Obstructive sleep apnea (adult) (pediatric): Secondary | ICD-10-CM

## 2019-11-22 DIAGNOSIS — R1011 Right upper quadrant pain: Secondary | ICD-10-CM

## 2019-11-22 DIAGNOSIS — E1169 Type 2 diabetes mellitus with other specified complication: Secondary | ICD-10-CM

## 2019-11-22 DIAGNOSIS — I429 Cardiomyopathy, unspecified: Secondary | ICD-10-CM

## 2019-11-22 DIAGNOSIS — I480 Paroxysmal atrial fibrillation: Principal | ICD-10-CM

## 2019-11-22 DIAGNOSIS — E871 Hypo-osmolality and hyponatremia: Secondary | ICD-10-CM

## 2019-11-22 DIAGNOSIS — I5021 Acute systolic (congestive) heart failure: Secondary | ICD-10-CM

## 2019-11-22 LAB — CBC WITH DIFFERENTIAL/PLATELET
Abs Immature Granulocytes: 0.03 10*3/uL (ref 0.00–0.07)
Basophils Absolute: 0.1 10*3/uL (ref 0.0–0.1)
Basophils Relative: 1 %
Eosinophils Absolute: 0 10*3/uL (ref 0.0–0.5)
Eosinophils Relative: 0 %
HCT: 48.1 % (ref 39.0–52.0)
Hemoglobin: 15.9 g/dL (ref 13.0–17.0)
Immature Granulocytes: 0 %
Lymphocytes Relative: 25 %
Lymphs Abs: 2.9 10*3/uL (ref 0.7–4.0)
MCH: 28.9 pg (ref 26.0–34.0)
MCHC: 33.1 g/dL (ref 30.0–36.0)
MCV: 87.5 fL (ref 80.0–100.0)
Monocytes Absolute: 0.9 10*3/uL (ref 0.1–1.0)
Monocytes Relative: 8 %
Neutro Abs: 7.6 10*3/uL (ref 1.7–7.7)
Neutrophils Relative %: 66 %
Platelets: 268 10*3/uL (ref 150–400)
RBC: 5.5 MIL/uL (ref 4.22–5.81)
RDW: 14.2 % (ref 11.5–15.5)
WBC: 11.4 10*3/uL — ABNORMAL HIGH (ref 4.0–10.5)
nRBC: 0 % (ref 0.0–0.2)

## 2019-11-22 LAB — BASIC METABOLIC PANEL
Anion gap: 12 (ref 5–15)
BUN: 43 mg/dL — ABNORMAL HIGH (ref 6–20)
CO2: 23 mmol/L (ref 22–32)
Calcium: 8.7 mg/dL — ABNORMAL LOW (ref 8.9–10.3)
Chloride: 103 mmol/L (ref 98–111)
Creatinine, Ser: 0.9 mg/dL (ref 0.61–1.24)
GFR calc Af Amer: >60 mL/min (ref >60)
GFR calc non Af Amer: >60 mL/min (ref >60)
Glucose, Bld: 65 mg/dL — ABNORMAL LOW (ref 70–99)
Potassium: 3.6 mmol/L (ref 3.5–5.1)
Sodium: 138 mmol/L (ref 135–145)

## 2019-11-22 LAB — GLUCOSE, CAPILLARY
Glucose-Capillary: 77 mg/dL (ref 70–99)
Glucose-Capillary: 90 mg/dL (ref 70–99)
Glucose-Capillary: 191 mg/dL — ABNORMAL HIGH (ref 70–99)
Glucose-Capillary: 169 mg/dL — ABNORMAL HIGH (ref 70–99)
Glucose-Capillary: 58 mg/dL — ABNORMAL LOW (ref 70–99)
Glucose-Capillary: 104 mg/dL — ABNORMAL HIGH (ref 70–99)

## 2019-11-22 LAB — BRAIN NATRIURETIC PEPTIDE: B Natriuretic Peptide: 578 pg/mL — ABNORMAL HIGH (ref 0.0–100.0)

## 2019-11-22 LAB — MAGNESIUM: Magnesium: 2.2 mg/dL (ref 1.7–2.4)

## 2019-11-22 MED ORDER — METOPROLOL SUCCINATE ER 50 MG PO TB24
50.0000 mg | ORAL_TABLET | Freq: Every day | ORAL | Status: DC
  Administered 2019-11-22: 50 mg via ORAL
  Filled 2019-11-22 (×2): qty 1

## 2019-11-22 MED ORDER — DEXTROSE 50 % IV SOLN
INTRAVENOUS | Status: AC
  Filled 2019-11-22: qty 50

## 2019-11-22 MED ORDER — INSULIN GLARGINE 100 UNIT/ML ~~LOC~~ SOLN
8.0000 [IU] | Freq: Every day | SUBCUTANEOUS | Status: DC
  Administered 2019-11-22 – 2019-11-23 (×2): 8 [IU] via SUBCUTANEOUS
  Filled 2019-11-22 (×4): qty 0.08

## 2019-11-22 MED ORDER — SPIRONOLACTONE 25 MG PO TABS
12.5000 mg | ORAL_TABLET | Freq: Every day | ORAL | Status: DC
  Administered 2019-11-22 – 2019-11-25 (×3): 12.5 mg via ORAL
  Filled 2019-11-22 (×2): qty 0.5
  Filled 2019-11-22: qty 1
  Filled 2019-11-22 (×3): qty 0.5
  Filled 2019-11-22: qty 1

## 2019-11-22 NOTE — Progress Notes (Signed)
PROGRESS NOTE [facility]   DELMONTE YERK  WSF:[id number]  DOB: 1960/12/12  DOA: 11/20/2019 PCP: Kirstie Peri, MD   Brief Admission Hx: 58 y.o. male with medical history significant of Hypertension, type 2 Diabetes mellitus, COPD, and arthritis was recently discharged from [facility] last week where he reportedly was treated for pneumonia.  He presented with nonspecific complaints of progressive shortness of breath and swelling in the lower extremities.  He also complained of right upper quadrant abdominal pain.  MDM/Assessment & Plan:   1. Newly discovered cardiomyopathy-not sure if this is related to his chronic alcohol history versus ischemia.  His troponins have been flat.  His cardiac echo revealed EF less than 20%.  The inpatient cardiology service has seen and adjusted his medications.   2. Atrial fibrillation with RVR-his heart rate is better controlled.  He is on apixaban for full anticoagulation.   3. Paroxysmal atrial fibrillation - confirmed by ECG, he has been started on metoprolol with good results.  Apixaban dosed by pharm D for full anticoagulation.   4. Acute systolic CHF exacerbation-he is responding well to IV Lasix.   Continue Lasix to 40 mg IV every 12 hours.  Continue to monitor intake and output closely and daily weights.  Monitor blood pressures closely. 5. Poorly controlled type 2 diabetes mellitus with hyperglycemia -as evidenced by hemoglobin A1c of 12.1% his glycemic control is slowly improving with insulin treatment.  Continue SSI coverage, CBG testing 5 times per day and begin diabetes education when medically stable. 6. Acute respiratory distress with hypoxia - continue supplemental oxygen.   7. Essential hypertension-he has had mostly softer blood pressures however has not become hypotensive on current treatment we will continue to monitor closely.   8. Right upper quadrant abdominal pain-abdominal ultrasound obtained and there is no signs of acute  cholecystitis he does have cholelithiasis present.  He has thickened walls in the gallbladder. 9. COPD-he does not appear to be in acute exacerbation at this time.  He has bronchodilators ordered.  He received IV solumedrol in ED.  10. OSA-diagnosis made by clinical observation as he was seen to have apnea episodes up to 50 seconds at a time while sleeping.  Auto titrated CPAP ordered to use while in the hospital.  He will need to have an official sleep study completed after he has been medically stabilized and discharged. 11. History of chronic alcoholism-the patient is adamant and reports that he stopped alcohol consumption 6 months ago.  He has been having restlessness and tremulousness since arrival and we are monitoring him closely for alcohol withdrawal.  His EtOH level has been negative. 12. Urinary retention - In/out cath ordered as needed, He seems to be urinating on his own.  13. Restlessness and agitation - seems to be improving,  lorazepam and haldol ordered as needed.  14. Leukocytosis - WBC trending down.  He received a dose of steroids in the ED on 11/20/19.  He does not have any active findings of infection at this time.    DVT prophylaxis: apixaban  Code Status: Full  Family Communication: tried to contact mother but no answer Disposition Plan: continue stepdown ICU status    Consultants:  [facility] consult cardiology  Inpatient cardiology starting 12/14  Procedures:    Antimicrobials:     Subjective: Pt seems to rest better on CPAP, he denies chest pain and is diuresing.  Less agitated today.     Objective: Vitals:   11/22/19 0851 11/22/19 0900 11/22/19  1100 11/22/19 1145  BP:  108/81 102/77   Pulse:  (!) 110 96 98  Resp:  (!) 21 17 (!) 24  Temp:    (!) 97.2 F (36.2 C)  TempSrc:    Oral  SpO2: 100% 100%  100%  Weight:      Height:        Intake/Output Summary (Last 24 hours) at 11/22/2019 1232 Last data filed at 11/22/2019 0710 Gross per 24 hour   Intake --  Output 1650 ml  Net -1650 ml   Filed Weights   11/20/19 0919 11/22/19 0500  Weight: 79.4 kg 88.1 kg    REVIEW OF SYSTEMS  UTO as he was just medicated, sedated  Exam:  General exam: chronically ill appearing, he appears older than stated age, he is sedated and sleeping, he is arousable.  Respiratory system: bibasilar crackles heard. No increased work of breathing. Cardiovascular system: irregularly irregular, normal S1 & S2 heard. Mild JVD, 1+ pedal edema. Gastrointestinal system: Abdomen is protuberant distended, soft and mild RUQ tenderness. Normal bowel sounds heard. Central nervous system: somnolent. No focal neurological deficits. Extremities: improving pitting edema RLE, trace pretibial edema LLE.  Data Reviewed: Basic Metabolic Panel: Recent Labs  Lab 11/20/19 0937 11/21/19 0513 11/22/19 0414  NA 132* 134* 138  K 4.5 4.7 3.6  CL 96* 101 103  CO2 24 19* 23  GLUCOSE 407* 181* 65*  BUN 16 30* 43*  CREATININE 0.94 1.04 0.90  CALCIUM 8.6* 8.4* 8.7*  MG  --  2.4 2.2   Liver Function Tests: Recent Labs  Lab 11/20/19 0937  AST 28  ALT 48*  ALKPHOS 92  BILITOT 0.6  PROT 6.2*  ALBUMIN 3.3*   Recent Labs  Lab 11/20/19 0937  LIPASE 30   No results for input(s): AMMONIA in the last 168 hours. CBC: Recent Labs  Lab 11/20/19 0937 11/21/19 0513 11/22/19 0414  WBC 10.3 14.5* 11.4*  NEUTROABS 7.2 10.1* 7.6  HGB 15.4 16.9 15.9  HCT 49.1 53.8* 48.1  MCV 90.3 90.3 87.5  PLT 230 277 268   Cardiac Enzymes: No results for input(s): CKTOTAL, CKMB, CKMBINDEX, TROPONINI in the last 168 hours. CBG (last 3)  Recent Labs    11/22/19 0342 11/22/19 0754 11/22/19 1144  GLUCAP 77 90 191*   Recent Results (from the past 240 hour(s))  SARS CORONAVIRUS 2 (TAT 6-24 HRS) Nasopharyngeal Nasopharyngeal Swab     Status: None   Collection Time: 11/20/19  1:06 PM   Specimen: Nasopharyngeal Swab  Result Value Ref Range Status   SARS Coronavirus 2 NEGATIVE  NEGATIVE Final    Comment: (NOTE) SARS-CoV-2 target nucleic acids are NOT DETECTED. The SARS-CoV-2 RNA is generally detectable in upper and lower respiratory specimens during the acute phase of infection. Negative results do not preclude SARS-CoV-2 infection, do not rule out co-infections with other pathogens, and should not be used as the sole basis for treatment or other patient management decisions. Negative results must be combined with clinical observations, patient history, and epidemiological information. The expected result is Negative. Fact Sheet for Patients: SugarRoll.be Fact Sheet for Healthcare Providers: https://www.woods-mathews.com/ This test is not yet approved or cleared by the [place] FDA and  has been authorized for detection and/or diagnosis of SARS-CoV-2 by FDA under an Emergency Use Authorization (EUA). This EUA will remain  in effect (meaning this test can be used) for the duration of the COVID-19 declaration under Section 56 4(b)(1) of the Act, 21 U.S.C. section 360bbb-3(b)(1), unless  the authorization is terminated or revoked sooner. Performed at [facility], [address] [place], [place] [address]   MRSA PCR Screening     Status: None   Collection Time: 11/20/19  3:17 PM   Specimen: Nasopharyngeal Swab  Result Value Ref Range Status   MRSA by PCR NEGATIVE NEGATIVE Final    Comment:        The GeneXpert MRSA Assay (FDA approved for NASAL specimens only), is one component of a comprehensive MRSA colonization surveillance program. It is not intended to diagnose MRSA infection nor to guide or monitor treatment for MRSA infections. Performed at [facility], [address] [place], [place] [address]      Studies: US Abdomen Complete  Result Date: 11/21/2019 CLINICAL DATA:  Right upper quadrant pain. EXAM: ABDOMEN ULTRASOUND COMPLETE COMPARISON:  None. FINDINGS: Gallbladder: Gallbladder wall  is thickened to 6-7 mm. Gallbladder mild to moderately distended. No stones. Common bile duct: Diameter: 5 mm Liver: Borderline enlarged. Normal parenchymal echogenicity. No mass or focal lesion. Portal vein is patent on color Doppler imaging with normal direction of blood flow towards the liver. IVC: No abnormality visualized. Pancreas: Obscured by midline bowel gas. Spleen: Size and appearance within normal limits. Right Kidney: Length: 13.3 cm.  There is Left Kidney: Length: 12.3 cm. Echogenicity within normal limits. No mass or hydronephrosis visualized. Abdominal aorta: No aneurysm visualized. Other findings: Ascites seen adjacent to the liver and spleen and gallbladder. Small right pleural effusion. IMPRESSION: 1. No acute findings. 2. Gallbladder wall thickened, but this is nonspecific in the setting of ascites. No gallstones. No findings to suggest acute cholecystitis. 3. Least a small amount of ascites. The liver is borderline enlarged but otherwise normal in appearance. There is no splenomegaly. 4. Pancreas not evaluated, obscured by overlying bowel gas. Electronically Signed   By: Amie Portlandavid  Ormond M.D.   On: 11/21/2019 12:42   US Venous Img Lower Unilateral Right (DVT)  Result Date: 11/21/2019 CLINICAL DATA:  Right lower extremity swelling. EXAM: Right LOWER EXTREMITY VENOUS DOPPLER ULTRASOUND TECHNIQUE: Gray-scale sonography with graded compression, as well as color Doppler and duplex ultrasound were performed to evaluate the lower extremity deep venous systems from the level of the common femoral vein and including the common femoral, femoral, profunda femoral, popliteal and calf veins including the posterior tibial, peroneal and gastrocnemius veins when visible. The superficial great saphenous vein was also interrogated. Spectral Doppler was utilized to evaluate flow at rest and with distal augmentation maneuvers in the common femoral, femoral and popliteal veins. COMPARISON:  None. FINDINGS:  Contralateral Common Femoral Vein: Respiratory phasicity is normal and symmetric with the symptomatic side. No evidence of thrombus. Normal compressibility. Common Femoral Vein: No evidence of thrombus. Normal compressibility, respiratory phasicity and response to augmentation. Saphenofemoral Junction: No evidence of thrombus. Normal compressibility and flow on color Doppler imaging. Profunda Femoral Vein: No evidence of thrombus. Normal compressibility and flow on color Doppler imaging. Femoral Vein: No evidence of thrombus. Normal compressibility, respiratory phasicity and response to augmentation. Popliteal Vein: No evidence of thrombus. Normal compressibility, respiratory phasicity and response to augmentation. Calf Veins: No evidence of thrombus. Normal compressibility and flow on color Doppler imaging. Superficial Great Saphenous Vein: No evidence of thrombus. Normal compressibility. Venous Reflux:  None. Other Findings:  None. IMPRESSION: No definite evidence of deep venous thrombosis seen in right lower extremity. Electronically Signed   By: Lupita RaiderJames  Green Jr M.D.   On: 11/21/2019 12:44   [facility]  Result Date: 11/21/2019 CLINICAL DATA:  Acute exacerbation of CHF (congestive heart failure) EXAM: PORTABLE CHEST 1 VIEW COMPARISON:  11/20/2019 FINDINGS: Stable large cardiac silhouette. Chronic bronchitic markings. No focal infiltrate. Mild LEFT basilar atelectasis and potential small effusion. Some improvement in venous congestion seen on prior. IMPRESSION: 1. Some improvement in congestive heart failure pattern. 2. Persistent cardiomegaly and small LEFT effusion. Electronically Signed   By: Genevive Bi M.D.   On: 11/21/2019 10:58   ECHOCARDIOGRAM COMPLETE  Result Date: 11/21/2019   ECHOCARDIOGRAM REPORT   Patient Name:   KASHTEN GOWIN Date of Exam: 11/21/2019 Medical Rec #:  [id number]        Height:       69.0 in Accession #:    [id number]       Weight:       175.0 lb Date of Birth:   20-Jan-1961        BSA:          1.95 m Patient Age:    58 years         BP:           104/87 mmHg Patient Gender: M                HR:           91 bpm. [facility]am [facility] Procedure: 2D Echo Indications:    Chest Pain 786.50 / R07.9  History:        Patient has no prior history of Echocardiogram examinations.                 CHF, COPD, Arrythmias:Atrial Fibrillation; Risk                 Factors:Diabetes, Current Smoker and Hypertension.  Sonographer:    Jeryl Columbia RDCS (AE) Referring Phys: [id number] Keywon Mestre L Stellan Vick IMPRESSIONS  1. Left ventricular ejection fraction, by visual estimation, is <20%. The left ventricle has severely decreased function. There is no left ventricular hypertrophy.  2. Left ventricular diastolic parameters are indeterminate.  3. The left ventricle demonstrates global hypokinesis.  4. Global right ventricle has mildly reduced systolic function.The right ventricular size is mildly enlarged. No increase in right ventricular wall thickness.  5. Left atrial size was moderately dilated.  6. Right atrial size was severely dilated.  7. The mitral valve is abnormal. Mild mitral valve regurgitation.  8. The tricuspid valve is grossly normal. Tricuspid valve regurgitation is mild.  9. The aortic valve is tricuspid. Aortic valve regurgitation is not visualized. 10. The pulmonic valve was grossly normal. Pulmonic valve regurgitation is trivial. 11. Moderately elevated pulmonary artery systolic pressure. 12. The inferior vena cava is dilated in size with <50% respiratory variability, suggesting right atrial pressure of 15 mmHg. FINDINGS  Left Ventricle: Left ventricular ejection fraction, by visual estimation, is <20%. The left ventricle has severely decreased function. The left ventricle demonstrates global hypokinesis. There is no left ventricular hypertrophy. Left ventricular diastolic parameters are indeterminate. Right Ventricle: The right ventricular size is mildly enlarged. No increase  in right ventricular wall thickness. Global RV systolic function is has mildly reduced systolic function. The tricuspid regurgitant velocity is 2.97 m/s, and with an assumed right atrial pressure of 15 mmHg, the estimated right ventricular systolic pressure is moderately elevated at 50.3 mmHg. Left Atrium: Left atrial size was moderately dilated. Right Atrium: Right atrial size was severely dilated Pericardium: There is no evidence of pericardial effusion. Mitral Valve: The mitral valve is  abnormal. There is mild thickening of the mitral valve leaflet(s). Mild mitral valve regurgitation. Tricuspid Valve: The tricuspid valve is grossly normal. Tricuspid valve regurgitation is mild. Aortic Valve: The aortic valve is tricuspid. Aortic valve regurgitation is not visualized. Pulmonic Valve: The pulmonic valve was grossly normal. Pulmonic valve regurgitation is trivial. Pulmonic regurgitation is trivial. Aorta: The aortic root and ascending aorta are structurally normal, with no evidence of dilitation. Venous: The inferior vena cava is dilated in size with less than 50% respiratory variability, suggesting right atrial pressure of 15 mmHg. IAS/Shunts: No atrial level shunt detected by color flow Doppler.  LEFT VENTRICLE PLAX 2D LVIDd:         5.18 cm  Diastology LVIDs:         4.54 cm  LV e' lateral: 5.57 cm/s LV PW:         1.46 cm  LV e' medial:  3.23 cm/s LV IVS:        1.05 cm LVOT diam:     2.00 cm LV SV:         34 ml LV SV Index:   17.18 LVOT Area:     3.14 cm  RIGHT VENTRICLE RV S prime:     9.96 cm/s TAPSE (M-mode): 1.1 cm LEFT ATRIUM              Index       RIGHT ATRIUM           Index LA diam:        4.50 cm  2.31 cm/m  RA Area:     25.00 cm LA Vol (A2C):   103.0 ml 52.77 ml/m RA Volume:   95.20 ml  48.77 ml/m LA Vol (A4C):   76.4 ml  39.14 ml/m LA Biplane Vol: 92.2 ml  47.23 ml/m   AORTA Ao Root diam: 3.50 cm MR Peak grad: 71.9 mmHg   TRICUSPID VALVE MR Mean grad: 47.0 mmHg   TR Peak grad:   35.3 mmHg  MR Vmax:      424.00 cm/s TR Vmax:        297.00 cm/s MR Vmean:     321.0 cm/s                           SHUNTS                           Systemic Diam: 2.00 cm  Zoila ShutterKenneth Hilty MD Electronically signed by Zoila ShutterKenneth Hilty MD Signature Date/Time: 11/21/2019/11:01:15 AM    Final      Scheduled Meds: . apixaban  5 mg Oral BID  . Chlorhexidine Gluconate Cloth  6 each Topical Q0600  . furosemide  40 mg Intravenous Q12H  . insulin aspart  0-15 Units Subcutaneous TID WC  . insulin aspart  0-5 Units Subcutaneous QHS  . insulin aspart  2 Units Subcutaneous TID WC  . insulin glargine  8 Units Subcutaneous Daily  . ipratropium-albuterol  3 mL Inhalation BID  . metoprolol succinate  50 mg Oral Daily  . mometasone-formoterol  2 puff Inhalation BID  . potassium chloride  10 mEq Oral BID  . spironolactone  12.5 mg Oral Daily   Continuous Infusions:  Principal Problem:   Atrial fibrillation with RVR (HCC) Active Problems:   Acute exacerbation of CHF (congestive heart failure) (HCC)   Acute respiratory distress   COPD (chronic obstructive pulmonary disease) (HCC)  Hypertension   Type 2 diabetes mellitus (HCC)   Cardiomyopathy (HCC)   Acute systolic CHF (congestive heart failure) (HCC)   History of alcohol abuse Quit 6 mos ago   History of recreational drug use   PAF (paroxysmal atrial fibrillation) (HCC)   Cardiac volume overload   Elevated brain natriuretic peptide (BNP) level   Edema of right lower leg   Hyponatremia   Abnormal electrocardiogram (ECG) (EKG)   Hyperglycemia   Tremulousness   Restlessness and agitation   OSA (obstructive sleep apnea)  Critical Care Procedure Note Authorized and Performed by: Maryln Manuel MD  Total Critical Care time:  32 minutes  Due to a high probability of clinically significant, life threatening deterioration, the patient required my highest level of preparedness to intervene emergently and I personally spent this critical care time directly and personally  managing the patient.  This critical care time included obtaining a history; examining the patient, pulse oximetry; ordering and review of studies; arranging urgent treatment with development of a management plan; evaluation of patient's response of treatment; frequent reassessment; and discussions with other providers.  This critical care time was performed to assess and manage the high probability of imminent and life threatening deterioration that could result in multi-organ failure.  It was exclusive of separately billable procedures and treating other patients and teaching time.   Standley Dakins, MD [facility] 11/22/2019, 12:32 PM    LOS: 2 days  How to contact the [facility] Attending or Consulting provider 7A - 7P or covering provider during after hours 7P -7A, for this patient?  1. Check the care team in [facility] and look for a) attending/consulting [facility] provider listed and b) the [facility] team listed 2. Log into [email or web address] and use [place]'s universal password to access. If you do not have the password, please contact the hospital operator. 3. Locate the [facility] provider you are looking for under [facility] and page to a number that you can be directly reached. 4. If you still have difficulty reaching the provider, please page the [facility] (Director on Call) for the Hospitalists listed on amion for assistance.

## 2019-11-22 NOTE — Progress Notes (Addendum)
Inpatient Diabetes Program Recommendations  AACE/ADA: New Consensus Statement on Inpatient Glycemic Control (2015)  Target Ranges:  Prepandial:   less than 140 mg/dL      Peak postprandial:   less than 180 mg/dL (1-2 hours)      Critically ill patients:  140 - 180 mg/dL   Lab Results  Component Value Date   GLUCAP 191 (H) 11/22/2019   HGBA1C 12.1 (H) 11/20/2019   DM 2  Invokamet [phone] mg 2 tablets Daily Glimepiride 2 mg Daily  Current Inpatient Orders: Lantus 8 units Daily, Novolog 0-15 units tid + hs + Novolog 2 units tid meal coverage   Spoke with pt over the phone regarding glucose control at home and A1c level of 12.1%. Patient reports his A1c was good about 3 months ago. Pt stated he had many issues with his sons, which are now incarcerated, that was driving him not wanting to take care of himself.  Patient was taking his medication maybe 3 times a week and checking his glucose maybe 2 times a week.  Patient had been on Victoza in the past approx 1 year ago and per pt worked very well however, cost was a barrier.   Pt reports being on Invokamet [phone] mg 2 tablets Daily and Amaryl 2 mg Daily for about 6-8 months now.  Discussed importance of glucose control and checking his CBGs regularly. Discussed importance of taking medication regularly.  Pt to discuss with MD if he should resume his oral meds as prescribed or will need the addition of insulin at time of d/c.  Spoke with pt that the operation of insulin pen is the same as Victoza injection operation.  Thanks,  Tama Headings RN, MSN, BC-ADM Inpatient Diabetes Coordinator Team Pager [phone] (8a-5p)

## 2019-11-22 NOTE — Progress Notes (Signed)
Pt asked to take Cpap off for a while. Placed patient back on 3L [place]. He states he feels a lot better. He's cooperative and oriented. Will continue to monitor

## 2019-11-22 NOTE — Progress Notes (Addendum)
Initial Nutrition Assessment  DOCUMENTATION CODES:   Not applicable  INTERVENTION:  Nutrition education handouts provided: Heart Healthy/CHO modified diet. Handout included/added in discharge instructions.   NUTRITION DIAGNOSIS:   Food and nutrition related knowledge deficit related to limited prior education(Low sodium diet) as evidenced by per patient/family report.   GOAL:  (Patient will modify usual intake to adopt a low sodium /cho modified diet)  MONITOR:   PO intake, Labs, Weight trends, I & O's  REASON FOR ASSESSMENT:   Malnutrition Screening Tool    ASSESSMENT: Patient is a 58 yo male with recent hospital stay for pneumonia. Hx of HTN, DM-2 (A1C-12.1%), COPD and arthirits. Newly diagnosed cardiomyopathy.    Meal intake: no data recorded. Patient affirms a good appetite. He would like to lose some weight. Reviewed low sodium diet guidelines with pt and handout included in discharge paperwork.    His weight has fluctuated between 88 kg current, 84.4 kg May 2020, 95.3 kg 11/2015.   Labs reviewed:  BMP Latest Ref Rng & Units 11/22/2019 11/21/2019 11/20/2019  Glucose 70 - 99 mg/dL 65(L) 181(H) 407(H)  BUN 6 - 20 mg/dL 43(H) 30(H) 16  Creatinine 0.61 - 1.24 mg/dL 0.90 1.04 0.94  Sodium 135 - 145 mmol/L 138 134(L) 132(L)  Potassium 3.5 - 5.1 mmol/L 3.6 4.7 4.5  Chloride 98 - 111 mmol/L 103 101 96(L)  CO2 22 - 32 mmol/L 23 19(L) 24  Calcium 8.9 - 10.3 mg/dL 8.7(L) 8.4(L) 8.6(L)     NUTRITION - FOCUSED PHYSICAL EXAM: Nutrition-Focused physical exam findings are no fat depletion, no muscle depletion, and mild-moderate pitting edema BLE.      Diet Order:   Diet Order            Diet heart healthy/carb modified Room service appropriate? Yes; Fluid consistency: Thin  Diet effective now              EDUCATION NEEDS:   Education needs have been addressed Skin:  Skin Assessment: Reviewed RN Assessment  Last BM:  12/12- distended abdomen  Height:   Ht  Readings from Last 1 Encounters:  11/20/19 5\' 9"  (1.753 m)    Weight:   Wt Readings from Last 1 Encounters:  11/22/19 88.1 kg    Ideal Body Weight:  73 kg  BMI:  Body mass index is 28.68 kg/m.  Estimated Nutritional Needs:   Kcal:  2033-2202 (MSJ x 1.2-1.3 AF)  Protein:  91-102 gr  Fluid:  <2 liters daily   Colman Cater MS,RD,CSG,LDN Office: [phone] Pager: [phone]

## 2019-11-22 NOTE — Progress Notes (Signed)
Pt 2200 CBG 58. Gave patient orange juice. Will recheck blood sugar in 15 minutes.

## 2019-11-22 NOTE — Consult Note (Addendum)
Cardiology Consultation:   Patient ID: Kevin Underwood MRN: [id number]; DOB: [birth year]  Admit date: 11/20/2019 Date of Consult: 11/22/2019  Primary Care Provider: Kirstie PeriShah, Ashish, MD Consulting Cardiologist: Nona DellSamuel Rayford Williamsen, MD  (New) Primary Electrophysiologist:  None     Patient Profile:   Kevin Underwood is a 58 y.o. male with a history of hypertension, COPD, DM type II who is being seen today for the evaluation of newly documented cardiomyopathy at the request of Dr. Laural BenesJohnson.  History of Present Illness:   Kevin Underwood is a 58 year old male patient with history of hypertension, DM type II, COPD, prior EtOH abuse (says he quit-tells me drank 6 pack/week and not any more than that).  He was [facility].  He came to the emergency room with leg swelling, worsening shortness of breath and was found to be in rapid atrial fibrillation and CHF.  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  Patient was also found to have cholelithiasis.  Observed to have OSA apnea spells up to 50 seconds on the ICU. Patient is disabled from chronic back trouble-used to do logging work. 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 Denies chest pain, tightness or neck pain. No family history of CAD-sister just found to have a "tumor" in her heart and is being seen today. Quit smoking 4 months ago-was 1ppd. Has had 60 lb unintentional weight loss in the past 8 months.  Heart Pathway Score:     Past Medical History:  Diagnosis Date  . Arthritis   . COPD (chronic obstructive pulmonary disease) (HCC)   . Essential hypertension   . Type 2 diabetes mellitus (HCC)     Past Surgical History:  Procedure Laterality Date  . BACK SURGERY    . FOOT SURGERY    . HAND SURGERY       Home Medications:  Prior to Admission medications   Medication Sig Start Date End Date Taking? Authorizing Provider  aspirin 81 MG  tablet Take 81 mg by mouth daily.   Yes [provider]  COMBIVENT RESPIMAT 20-100 MCG/ACT AERS respimat Inhale 2 Inhalers into the lungs as needed. 10/17/15  Yes [provider]  gabapentin (NEURONTIN) 600 MG tablet Take 1 tablet by mouth every 12 (twelve) hours. 09/24/19  Yes [provider]  glimepiride (AMARYL) 2 MG tablet Take 2 mg by mouth daily. Reported on 11/28/2015   Yes [provider]  INVOKAMET XR [id number] MG TB24 Take 2 tablets by mouth daily. 10/06/19  Yes [provider]  lisinopril (PRINIVIL,ZESTRIL) 20 MG tablet Take 20 mg by mouth daily.   Yes [provider]  SYMBICORT 160-4.5 MCG/ACT inhaler Inhale 2 puffs into the lungs 2 (two) times daily.  03/30/19  Yes [provider]  VICTOZA 18 MG/3ML SOPN Inject 1.2 Units into the skin daily. 09/05/15  Yes [provider]  ADVAIR DISKUS 250-50 MCG/DOSE AEPB Inhale 2 puffs into the lungs 2 (two) times daily. 09/14/15   [provider]  albuterol (PROVENTIL) (2.5 MG/3ML) 0.083% nebulizer solution Inhale 1 mL into the lungs 4 (four) times daily as needed. 10/17/15   [provider]  INVOKANA 100 MG TABS tablet Take 100 mg by mouth daily. 10/19/15   [provider]  metFORMIN (GLUCOPHAGE) 500 MG tablet Take 1,000 mg by mouth 2 (two) times daily with a meal.    [provider]  Inpatient Medications: Scheduled Meds: . apixaban  5 mg Oral BID  . Chlorhexidine Gluconate Cloth  6 each Topical Q0600  . furosemide  40 mg Intravenous Q12H  . insulin aspart  0-15 Units Subcutaneous TID WC  . insulin aspart  0-5 Units Subcutaneous QHS  . insulin aspart  2 Units Subcutaneous TID WC  . insulin glargine  8 Units Subcutaneous Daily  . ipratropium-albuterol  3 mL Inhalation BID  . metoprolol tartrate  25 mg Oral Q6H  . mometasone-formoterol  2 puff Inhalation BID  . potassium chloride  10 mEq Oral BID  . ramipril  1.25 mg Oral Daily    PRN  Meds: albuterol, gabapentin, haloperidol lactate, LORazepam, nicotine  Allergies:   No Known Allergies  Social History:   Social History   Socioeconomic History  . Marital status: Married    Spouse name: Not on file  . Number of children: Not on file  . Years of education: 68  . Highest education level: Not on file  Occupational History  . Not on file  Tobacco Use  . Smoking status: Current Every Day Smoker    Packs/day: 1.00    Years: 30.00    Pack years: 30.00    Types: Cigarettes  . Smokeless tobacco: Never Used  Substance and Sexual Activity  . Alcohol use: Yes    Comment: Prior history of excessive intake  . Drug use: No  . Sexual activity: Not on file  Other Topics Concern  . Not on file  Social History Narrative  . Not on file   Social Determinants of Health   Financial Resource Strain:   . Difficulty of Paying Living Expenses: Not on file  Food Insecurity:   . Worried About Programme researcher, broadcasting/film/video in the Last Year: Not on file  . Ran Out of Food in the Last Year: Not on file  Transportation Needs:   . Lack of Transportation (Medical): Not on file  . Lack of Transportation (Non-Medical): Not on file  Physical Activity:   . Days of Exercise per Week: Not on file  . Minutes of Exercise per Session: Not on file  Stress:   . Feeling of Stress : Not on file  Social Connections:   . Frequency of Communication with Friends and Family: Not on file  . Frequency of Social Gatherings with Friends and Family: Not on file  . Attends Religious Services: Not on file  . Active Member of Clubs or Organizations: Not on file  . Attends Banker Meetings: Not on file  . Marital Status: Not on file  Intimate Partner Violence:   . Fear of Current or Ex-Partner: Not on file  . Emotionally Abused: Not on file  . Physically Abused: Not on file  . Sexually Abused: Not on file    Family History:     Family History  Problem Relation Age of Onset  . Arthritis Other    . Lung disease Other   . Asthma Other   . Diabetes Other   . Heart disease Sister        Tumor?     ROS:  Please see the history of present illness.  Review of Systems  Constitution: Positive for weight loss.  HENT: Negative.   Cardiovascular: Positive for leg swelling.  Respiratory: Positive for cough, shortness of breath, sleep disturbances due to breathing and snoring.   Endocrine: Negative.   Hematologic/Lymphatic: Negative.   Musculoskeletal: Negative.   Gastrointestinal: Negative.  Genitourinary: Negative.   Neurological: Negative.    All other ROS reviewed and negative.     Physical Exam/Data:   Vitals:   11/22/19 0755 11/22/19 0847 11/22/19 0851 11/22/19 0900  BP:    108/81  Pulse: 94   (!) 110  Resp: (!) 27   (!) 21  Temp: (!) 97.1 F (36.2 C)     TempSrc: Oral     SpO2: 100% 100% 100% 100%  Weight:      Height:        Intake/Output Summary (Last 24 hours) at 11/22/2019 0916 Last data filed at 11/22/2019 0710 Gross per 24 hour  Intake --  Output 2050 ml  Net -2050 ml   Last 3 Weights 11/22/2019 11/20/2019 04/15/2019  Weight (lbs) 194 lb 3.6 oz 175 lb 186 lb  Weight (kg) 88.1 kg 79.379 kg 84.369 kg     Body mass index is 28.68 kg/m.  General:  In no acute distress  HEENT: normal Lymph: no adenopathy Neck: increased JVD Endocrine:  No thryomegaly Vascular: No carotid bruits; FA pulses 2+ bilaterally without bruits  Cardiac:  normal S1, S2; RRR with ectopy; no murmur  Distant HS Lungs:  Decreased breath sounds throughout but clear to auscultation bilaterally, no wheezing, rhonchi or rales  Abd: soft, nontender, a little distended Ext: Mild lower leg edema Musculoskeletal:  No deformities, BUE and BLE strength normal and equal Skin: warm and dry  Neuro:  CNs 2-12 intact, no focal abnormalities noted Psych:  Normal affect, sleepy  EKG:  The EKG was personally reviewed and demonstrates: Sinus tachycardia with poor R wave progression anteriorly,  PVCs and some A. Fib  Telemetry:  Telemetry was personally reviewed and demonstrates:  NSR-ST PVC's some afib  Relevant CV Studies: See echo report below  Laboratory Data:  High Sensitivity Troponin:   Recent Labs  Lab 11/20/19 0937 11/20/19 1111  TROPONINIHS 34* 32*     Chemistry Recent Labs  Lab 11/20/19 0937 11/21/19 0513 11/22/19 0414  NA 132* 134* 138  K 4.5 4.7 3.6  CL 96* 101 103  CO2 24 19* 23  GLUCOSE 407* 181* 65*  BUN 16 30* 43*  CREATININE 0.94 1.04 0.90  CALCIUM 8.6* 8.4* 8.7*  GFRNONAA >60 >60 >60  GFRAA >60 >60 >60  ANIONGAP Recent Labs  Lab 11/20/19 0937  PROT 6.2*  ALBUMIN 3.3*  AST 28  ALT 48*  ALKPHOS 92  BILITOT 0.6   Hematology Recent Labs  Lab 11/20/19 0937 11/21/19 0513 11/22/19 0414  WBC 10.3 14.5* 11.4*  RBC 5.44 5.96* 5.50  HGB 15.4 16.9 15.9  HCT 49.1 53.8* 48.1  MCV 90.3 90.3 87.5  MCH 28.3 28.4 28.9  MCHC 31.4 31.4 33.1  RDW 14.3 14.2 14.2  PLT 230 277 268   BNP Recent Labs  Lab 11/20/19 0937 11/21/19 0514 11/22/19 0414  BNP 862.0* 1,285.0* 578.0*    DDimer  Recent Labs  Lab 11/20/19 0937  DDIMER 2.61*     Radiology/Studies:  CT Angio Chest PE W and/or Wo Contrast  Result Date: 11/20/2019 CLINICAL DATA:  Progressive short of breath. EXAM: CT ANGIOGRAPHY CHEST WITH CONTRAST TECHNIQUE: Multidetector CT imaging of the chest was performed using the standard protocol during bolus administration of intravenous contrast. Multiplanar CT image reconstructions and MIPs were obtained to evaluate the vascular anatomy. CONTRAST:  OMNIPAQUE IOHEXOL 350 MG/ML SOLN COMPARISON:  None. FINDINGS: Cardiovascular: No filling defects within the pulmonary arteries to suggest  acute pulmonary embolism. No acute findings of the aorta or great vessels. No pericardial fluid. Mediastinum/Nodes: No axillary supraclavicular adenopathy. No mediastinal adenopathy no pericardial effusion. Lungs/Pleura: There is new bilateral  moderate layering effusions. There is mild ground-glass opacities in the lungs suggesting edema. Centrilobular emphysema the upper lobes. There is interlobular septal thickening in the LEFT lower lobe and inferior lingula. Upper Abdomen: Limited view of the liver, kidneys, pancreas are unremarkable. Normal adrenal glands. Musculoskeletal: No aggressive osseous lesion. Review of the MIP images confirms the above findings. IMPRESSION: 1. No evidence acute pulmonary embolism. 2. Bilateral moderate volume layering pleural effusions with ground-glass opacity suggesting mild edema. Findings most suggestive congestive heart failure. 3. Mild atelectasis and interlobular septal thickening in the LEFT lower lobe. 4. No clear evidence of pulmonary infection. Electronically Signed   By: Suzy Bouchard M.D.   On: 11/20/2019 12:34   US Abdomen Complete  Result Date: 11/21/2019 CLINICAL DATA:  Right upper quadrant pain. EXAM: ABDOMEN ULTRASOUND COMPLETE COMPARISON:  None. FINDINGS: Gallbladder: Gallbladder wall is thickened to 6-7 mm. Gallbladder mild to moderately distended. No stones. Common bile duct: Diameter: 5 mm Liver: Borderline enlarged. Normal parenchymal echogenicity. No mass or focal lesion. Portal vein is patent on color Doppler imaging with normal direction of blood flow towards the liver. IVC: No abnormality visualized. Pancreas: Obscured by midline bowel gas. Spleen: Size and appearance within normal limits. Right Kidney: Length: 13.3 cm.  There is Left Kidney: Length: 12.3 cm. Echogenicity within normal limits. No mass or hydronephrosis visualized. Abdominal aorta: No aneurysm visualized. Other findings: Ascites seen adjacent to the liver and spleen and gallbladder. Small right pleural effusion. IMPRESSION: 1. No acute findings. 2. Gallbladder wall thickened, but this is nonspecific in the setting of ascites. No gallstones. No findings to suggest acute cholecystitis. 3. Least a small amount of ascites. The  liver is borderline enlarged but otherwise normal in appearance. There is no splenomegaly. 4. Pancreas not evaluated, obscured by overlying bowel gas. Electronically Signed   By: Lajean Manes M.D.   On: 11/21/2019 12:42   US Venous Img Lower Unilateral Right (DVT)  Result Date: 11/21/2019 CLINICAL DATA:  Right lower extremity swelling. EXAM: Right LOWER EXTREMITY VENOUS DOPPLER ULTRASOUND TECHNIQUE: Gray-scale sonography with graded compression, as well as color Doppler and duplex ultrasound were performed to evaluate the lower extremity deep venous systems from the level of the common femoral vein and including the common femoral, femoral, profunda femoral, popliteal and calf veins including the posterior tibial, peroneal and gastrocnemius veins when visible. The superficial great saphenous vein was also interrogated. Spectral Doppler was utilized to evaluate flow at rest and with distal augmentation maneuvers in the common femoral, femoral and popliteal veins. COMPARISON:  None. FINDINGS: Contralateral Common Femoral Vein: Respiratory phasicity is normal and symmetric with the symptomatic side. No evidence of thrombus. Normal compressibility. Common Femoral Vein: No evidence of thrombus. Normal compressibility, respiratory phasicity and response to augmentation. Saphenofemoral Junction: No evidence of thrombus. Normal compressibility and flow on color Doppler imaging. Profunda Femoral Vein: No evidence of thrombus. Normal compressibility and flow on color Doppler imaging. Femoral Vein: No evidence of thrombus. Normal compressibility, respiratory phasicity and response to augmentation. Popliteal Vein: No evidence of thrombus. Normal compressibility, respiratory phasicity and response to augmentation. Calf Veins: No evidence of thrombus. Normal compressibility and flow on color Doppler imaging. Superficial Great Saphenous Vein: No evidence of thrombus. Normal compressibility. Venous Reflux:  None. Other  Findings:  None. IMPRESSION: No definite evidence of deep  venous thrombosis seen in right lower extremity. Electronically Signed   By: Lupita RaiderJames  Green Jr M.D.   On: 11/21/2019 12:44   DG Chest Port 1 View  Result Date: 11/21/2019 CLINICAL DATA:  Acute exacerbation of CHF (congestive heart failure) EXAM: PORTABLE CHEST 1 VIEW COMPARISON:  11/20/2019 FINDINGS: Stable large cardiac silhouette. Chronic bronchitic markings. No focal infiltrate. Mild LEFT basilar atelectasis and potential small effusion. Some improvement in venous congestion seen on prior. IMPRESSION: 1. Some improvement in congestive heart failure pattern. 2. Persistent cardiomegaly and small LEFT effusion. Electronically Signed   By: Genevive BiStewart  Edmunds M.D.   On: 11/21/2019 10:58   DG Chest Portable 1 View  Result Date: 11/20/2019 CLINICAL DATA:  Shortness of breath. EXAM: PORTABLE CHEST 1 VIEW COMPARISON:  November 02, 2019. FINDINGS: Stable cardiomegaly. Atherosclerosis of thoracic aorta is noted. Mild central pulmonary vascular congestion is noted. No pneumothorax is noted. Slightly improved bibasilar opacities are noted suggesting improving edema or infiltrates. Small left pleural effusion may be present. Bony thorax is unremarkable. IMPRESSION: Aortic atherosclerosis. Stable cardiomegaly with mild central pulmonary vascular congestion. Slightly improved bibasilar opacities are noted suggesting improving edema or infiltrates. Small left pleural effusion may be present. Electronically Signed   By: Lupita RaiderJames  Green Jr M.D.   On: 11/20/2019 09:48   ECHOCARDIOGRAM COMPLETE  Result Date: 11/21/2019   ECHOCARDIOGRAM REPORT   Patient Name:   Kevin Underwood Date of Exam: 11/21/2019 Medical Rec #:  [id number]        Height:       69.0 in Accession #:    [id number]       Weight:       175.0 lb Date of Birth:  1961-04-12        BSA:          1.95 m Patient Age:    58 years         BP:           104/87 mmHg Patient Gender: M                HR:            91 bpm. [facility] Procedure: 2D Echo Indications:    Chest Pain 786.50 / R07.9  History:        Patient has no prior history of Echocardiogram examinations.                 CHF, COPD, Arrythmias:Atrial Fibrillation; Risk                 Factors:Diabetes, Current Smoker and Hypertension.  Sonographer:    Jeryl ColumbiaJohanna Elliott RDCS (AE) Referring Phys: [id number] CLANFORD L JOHNSON IMPRESSIONS  1. Left ventricular ejection fraction, by visual estimation, is <20%. The left ventricle has severely decreased function. There is no left ventricular hypertrophy.  2. Left ventricular diastolic parameters are indeterminate.  3. The left ventricle demonstrates global hypokinesis.  4. Global right ventricle has mildly reduced systolic function.The right ventricular size is mildly enlarged. No increase in right ventricular wall thickness.  5. Left atrial size was moderately dilated.  6. Right atrial size was severely dilated.  7. The mitral valve is abnormal. Mild mitral valve regurgitation.  8. The tricuspid valve is grossly normal. Tricuspid valve regurgitation is mild.  9. The aortic valve is tricuspid. Aortic valve regurgitation is not visualized. 10. The pulmonic valve was grossly normal. Pulmonic valve regurgitation is trivial. 11. Moderately elevated pulmonary artery systolic pressure. 12.  The inferior vena cava is dilated in size with <50% respiratory variability, suggesting right atrial pressure of 15 mmHg. FINDINGS  Left Ventricle: Left ventricular ejection fraction, by visual estimation, is <20%. The left ventricle has severely decreased function. The left ventricle demonstrates global hypokinesis. There is no left ventricular hypertrophy. Left ventricular diastolic parameters are indeterminate. Right Ventricle: The right ventricular size is mildly enlarged. No increase in right ventricular wall thickness. Global RV systolic function is has mildly reduced systolic function. The tricuspid regurgitant velocity is  2.97 m/s, and with an assumed right atrial pressure of 15 mmHg, the estimated right ventricular systolic pressure is moderately elevated at 50.3 mmHg. Left Atrium: Left atrial size was moderately dilated. Right Atrium: Right atrial size was severely dilated Pericardium: There is no evidence of pericardial effusion. Mitral Valve: The mitral valve is abnormal. There is mild thickening of the mitral valve leaflet(s). Mild mitral valve regurgitation. Tricuspid Valve: The tricuspid valve is grossly normal. Tricuspid valve regurgitation is mild. Aortic Valve: The aortic valve is tricuspid. Aortic valve regurgitation is not visualized. Pulmonic Valve: The pulmonic valve was grossly normal. Pulmonic valve regurgitation is trivial. Pulmonic regurgitation is trivial. Aorta: The aortic root and ascending aorta are structurally normal, with no evidence of dilitation. Venous: The inferior vena cava is dilated in size with less than 50% respiratory variability, suggesting right atrial pressure of 15 mmHg. IAS/Shunts: No atrial level shunt detected by color flow Doppler.  LEFT VENTRICLE PLAX 2D LVIDd:         5.18 cm  Diastology LVIDs:         4.54 cm  LV e' lateral: 5.57 cm/s LV PW:         1.46 cm  LV e' medial:  3.23 cm/s LV IVS:        1.05 cm LVOT diam:     2.00 cm LV SV:         34 ml LV SV Index:   17.18 LVOT Area:     3.14 cm  RIGHT VENTRICLE RV S prime:     9.96 cm/s TAPSE (M-mode): 1.1 cm LEFT ATRIUM              Index       RIGHT ATRIUM           Index LA diam:        4.50 cm  2.31 cm/m  RA Area:     25.00 cm LA Vol (A2C):   103.0 ml 52.77 ml/m RA Volume:   95.20 ml  48.77 ml/m LA Vol (A4C):   76.4 ml  39.14 ml/m LA Biplane Vol: 92.2 ml  47.23 ml/m   AORTA Ao Root diam: 3.50 cm MR Peak grad: 71.9 mmHg   TRICUSPID VALVE MR Mean grad: 47.0 mmHg   TR Peak grad:   35.3 mmHg MR Vmax:      424.00 cm/s TR Vmax:        297.00 cm/s MR Vmean:     321.0 cm/s                           SHUNTS                            Systemic Diam: 2.00 cm  Zoila Shutter MD Electronically signed by Zoila Shutter MD Signature Date/Time: 11/21/2019/11:01:15 AM    Final     Assessment and Plan:  1. New cardiomyopathy ejection fraction 20% with global hypokinesis on echo 11/21/2019 no significant valvular abnormalities. Was on lisinopril at home but on ramipril here. Would benefit from entresto if BP will allow. On metoprolol for rate control. No history of chest pain but multiple CV risk factors. Could also be tachycardia related and ? ETOHWould optimize meds, recheck echo in 3 months and do an ischemic work up as outpatient. 2. Acute systolic CHF I/O's negative 3.5 L since admission. Has some JVD but no edema and lungs clear, some ascites on abdominal US 3. Atrial fibrillation with RVR converted to normal sinus rhythm metoprolol.  Now on Eliquis.  CHA2DS2-VASc score equals 3(HTN, DM2, CHF) 4. Essential hypertension blood pressures have been soft 5. COPD-quit smoking 4 months ago 6. OSA on observation with 50 seconds apnea spell-will need sleep study/CPAP 7. ?History of EtOH although patient tells me no more than a 6 pack/week. 8. RUQ pain with no acute gallbladder disease on [place] 9. Unintentional weight loss-60 lbs in 8 months    For questions or updates, please contact [facility] Please consult [email or web address] for contact info under    Signed, Jacolyn Reedy, PA-C 11/22/2019 9:16 AM    Attending note:  Patient seen and examined. Available records reviewed. Case discussed with Ms. Geni Bers PA-C. Mr. Yielding presents with worsening shortness of breath and leg swelling over the last week. He was reportedly admitted to [facility] recently with pneumonia. Baseline history includes hypertension, poorly controlled type 2 diabetes mellitus, tobacco abuse with COPD, previous alcohol abuse but not at least over the last year by his report.  Initially reported to be in rapid atrial fibrillation, although review of available  ECG shows sinus rhythm and sinus tachycardia with frequent PACs and perhaps bursts of atrial fibrillation.  Presently by telemetry he is in sinus rhythm.  He states breathing not at baseline but improved compared to presentation, no active cough or chest pain.  He is afebrile, heart rate in the 90s to 100s, systolic blood pressure ranging mid 90s to 110.  Lungs exhibit decreased breath sounds at the bases bilaterally consistent with effusions.  He has elevated JVD, cardiac exam with distant heart sounds and indistinct PMI, no obvious S3 gallop or rub.  Abdomen is nontender but protuberant.  Legs show mild edema, right worse than left.  Lab work reveals potassium 3.6, BUN 43, creatinine 0.9, BNP down to 578, hemoglobin 15.9, platelets 268, AST 28, ALT 48, lipase 30, alkaline phosphatase 92, high-sensitivity troponin I 34 and 32, D-dimer 2.61, hemoglobin A1c 12.1%, TSH 1.68, SARS coronavirus 2 test negative.  Lower extremity venous Dopplers negative for DVT on the right..  Abdominal ultrasound shows gallbladder wall thickening but no other acute findings, at least a small amount of ascites.  Chest CT negative for pulmonary embolus.  Moderate bilateral pleural effusions are noted.  Patient presents with presumably paroxysmal atrial fibrillation with RVR and newly documented cardiomyopathy.  LVEF approximately 20% with global hypokinesis, mild RV dysfunction, moderately dilated left atrium, and moderately elevated PASP.  He still has evidence of fluid overload, pleural effusions and JVD.  Heart rate better controlled.  Plan is to optimize medications and work on volume status.  Switch from Lopressor to Toprol-XL 50 mg daily, start Aldactone 12.5 mg daily, continue IV Lasix.  We will stop ramipril with an eye toward considering Entresto eventually depending on his blood pressure.  He has been started on Eliquis by primary team as well.  At this point suspect nonischemic  etiology to cardiomyopathy, but he will need  further work-up particularly with poorly controlled diabetes mellitus and increased risk for ischemic heart disease.  Present high-sensitivity troponin I trend is not consistent with ACS however.  Jonelle Sidle, M.D., F.A.C.C.

## 2019-11-22 NOTE — Discharge Instructions (Addendum)
Heart Healthy, Consistent Carbohydrate Nutrition Therapy   A heart-healthy and consistent carbohydrate diet is recommended to manage heart disease and diabetes. To follow a heart-healthy and consistent carbohydrate diet, . Eat a balanced diet with whole grains, fruits and vegetables, and lean protein sources.  . Choose heart-healthy unsaturated fats. Limit saturated fats, trans fats, and cholesterol intake. Eat more plant-based or vegetarian meals using beans and soy foods for protein.  . Eat whole, unprocessed foods to limit the amount of sodium (salt) you eat.  . Choose a consistent amount of carbohydrate at each meal and snack. Limit refined carbohydrates especially sugar, sweets and sugar-sweetened beverages.  . If you drink alcohol, do so in moderation: one serving per day (women) and two servings per day (men). o One serving is equivalent to 12 ounces beer, 5 ounces wine, or 1.5 ounces distilled spirits  Tips Tips for Choosing Heart-Healthy Fats Choose lean protein and low-fat dairy foods to reduce saturated fat intake. . Saturated fat is usually found in animal-based protein and is associated with certain health risks. Saturated fat is the biggest contributor to raise low-density lipoprotein (LDL) cholesterol levels. Research shows that limiting saturated fat lowers unhealthy cholesterol levels. Eat no more than 7% of your total calories each day from saturated fat. Ask your RDN to help you determine how much saturated fat is right for you. . There are many foods that do not contain large amounts of saturated fats. Swapping these foods to replace foods high in saturated fats will help you limit the saturated fat you eat and improve your cholesterol levels. You can also try eating more plant-based or vegetarian meals. Instead of. Try:  Whole milk, cheese, yogurt, and ice cream 1% or skim milk, low-fat cheese, non-fat yogurt, and low-fat ice cream  Fatty, marbled beef and pork Lean beef, pork,  or venison  Poultry with skin Poultry without skin  Butter, stick margarine Reduced-fat, whipped, or liquid spreads  Coconut oil, palm oil Liquid vegetable oils: corn, canola, olive, soybean and safflower oils   Avoid foods that contain trans fats. . Trans fats increase levels of LDL-cholesterol. Hydrogenated fat in processed foods is the main source of trans fats in foods.  . Trans fats can be found in stick margarine, shortening, processed sweets, baked goods, some fried foods, and packaged foods made with hydrogenated oils. Avoid foods with "partially hydrogenated oil" on the ingredient list such as: cookies, pastries, baked goods, biscuits, crackers, microwave popcorn, and frozen dinners. Choose foods with heart healthy fats. . Polyunsaturated and monounsaturated fat are unsaturated fats that may help lower your blood cholesterol level when used in place of saturated fat in your diet. . Ask your RDN about taking a dietary supplement with plant sterols and stanols to help lower your cholesterol level. Marland Kitchen Research shows that substituting saturated fats with unsaturated fats is beneficial to cholesterol levels. Try these easy swaps: Instead of. Try:  Butter, stick margarine, or solid shortening Reduced-fat, whipped, or liquid spreads  Beef, pork, or poultry with skin Fish and seafood  Chips, crackers, snack foods Raw or unsalted nuts and seeds or nut butters Hummus with vegetables Avocado on toast  Coconut oil, palm oil Liquid vegetable oils: corn, canola, olive, soybean and safflower oils  Limit the amount of cholesterol you eat to less than 200 milligrams per day. . Cholesterol is a substance carried through the bloodstream via lipoproteins, which are known as "transporters" of fat. Some body functions need cholesterol to work properly, but too much  cholesterol in the bloodstream can damage arteries and build up blood vessel linings (which can lead to heart attack and stroke). You should eat  less than 200 milligrams cholesterol per day. Marland Kitchen People respond differently to eating cholesterol. There is no test available right now that can figure out which people will respond more to dietary cholesterol and which will respond less. For individuals with high intake of dietary cholesterol, different types of increase (none, small, moderate, large) in LDL-cholesterol levels are all possible.  . Food sources of cholesterol include egg yolks and organ meats such as liver, gizzards. Limit egg yolks to two to four per week and avoid organ meats like liver and gizzards to control cholesterol intake. Tips for Choosing Heart-Healthy Carbohydrates Consume a consistent amount of carbohydrate . It is important to eat foods with carbohydrates in moderation because they impact your blood glucose level. Carbohydrates can be found in many foods such as: . Grains (breads, crackers, rice, pasta, and cereals)  . Starchy Vegetables (potatoes, corn, and peas)  . Beans and legumes  . Milk, soy milk, and yogurt  . Fruit and fruit juice  . Sweets (cakes, cookies, ice cream, jam and jelly) . Your RDN will help you set a goal for how many carbohydrate servings to eat at your meals and snacks. For many adults, eating 3 to 5 servings of carbohydrate foods at each meal and 1 or 2 carbohydrate servings for each snack works well.  . Check your blood glucose level regularly. It can tell you if you need to adjust when you eat carbohydrates. . Choose foods rich in viscous (soluble) fiber . Viscous, or soluble, is found in the walls of plant cells. Viscous fiber is found only in plant-based foods. Eating foods with fiber helps to lower your unhealthy cholesterol and keep your blood glucose in range  . Rich sources of viscous fiber include vegetables (asparagus, Brussels sprouts, sweet potatoes, turnips) fruit (apricots, mangoes, oranges), legumes, and whole grains (barley, oats, and oat bran).  . As you increase your fiber  intake gradually, also increase the amount of water you drink. This will help prevent constipation.  . If you have difficulty achieving this goal, ask your RDN about fiber laxatives. Choose fiber supplements made with viscous fibers such as psyllium seed husks or methylcellulose to help lower unhealthy cholesterol.  . Limit refined carbohydrates  . There are three types of carbohydrates: starches, sugar, and fiber. Some carbohydrates occur naturally in food, like the starches in rice or corn or the sugars in fruits and milk. Refined carbohydrates--foods with high amounts of simple sugars--can raise triglyceride levels. High triglyceride levels are associated with coronary heart disease. . Some examples of refined carbohydrate foods are table sugar, sweets, and beverages sweetened with added sugar. Tips for Reducing Sodium (Salt) Although sodium is important for your body to function, too much sodium can be harmful for people with high blood pressure. As sodium and fluid buildup in your tissues and bloodstream, your blood pressure increases. High blood pressure may cause damage to other organs and increase your risk for a stroke. Even if you take a pill for blood pressure or a water pill (diuretic) to remove fluid, it is still important to have less salt in your diet. Ask your doctor and RDN what amount of sodium is right for you. Marland Kitchen Avoid processed foods. Eat more fresh foods.  . Fresh fruits and vegetables are naturally low in sodium, as well as frozen vegetables and fruits that have  no added juices or sauces.  . Fresh meats are lower in sodium than processed meats, such as bacon, sausage, and hotdogs. Read the nutrition label or ask your butcher to help you find a fresh meat that is low in sodium. . Eat less salt--at the table and when cooking.  . A single teaspoon of table salt has 2,300 mg of sodium.  . Leave the salt out of recipes for pasta, casseroles, and soups.  . Ask your RDN how to cook your  favorite recipes without sodium . Be a Paramedic.  . Look for food packages that say "salt-free" or "sodium-free." These items contain less than 5 milligrams of sodium per serving.  Marland Kitchen "Very low-sodium" products contain less than 35 milligrams of sodium per serving.  Marland Kitchen "Low-sodium" products contain less than 140 milligrams of sodium per serving.  . Beware for "Unsalted" or "No Added Salt" products. These items may still be high in sodium. Check the nutrition label. . Add flavors to your food without adding sodium.  . Try lemon juice, lime juice, fruit juice or vinegar.  . Dry or fresh herbs add flavor. Try basil, bay leaf, dill, rosemary, parsley, sage, dry mustard, nutmeg, thyme, and paprika.  . Pepper, red pepper flakes, and cayenne pepper can add spice t your meals without adding sodium. Hot sauce contains sodium, but if you use just a drop or two, it will not add up to much.  Sharyn Lull a sodium-free seasoning blend or make your own at home. Additional Lifestyle Tips Achieve and maintain a healthy weight. . Talk with your RDN or your doctor about what is a healthy weight for you. . Set goals to reach and maintain that weight.  . To lose weight, reduce your calorie intake along with increasing your physical activity. A weight loss of 10 to 15 pounds could reduce LDL-cholesterol by 5 milligrams per deciliter. Participate in physical activity. . Talk with your health care team to find out what types of physical activity are best for you. Set a plan to get about 30 minutes of exercise on most days.  Foods Recommended Food Group Foods Recommended  Grains Whole grain breads and cereals, including whole wheat, barley, rye, buckwheat, corn, teff, quinoa, millet, amaranth, brown or wild rice, sorghum, and oats Pasta, especially whole wheat or other whole grain types  [employer], quinoa or wild rice Whole grain crackers, bread, rolls, pitas Home-made bread with reduced-sodium baking soda  Protein  Foods Lean cuts of beef and pork (loin, leg, round, extra lean hamburger)  Skinless Cytogeneticist and other wild game Dried beans and peas Nuts and nut butters Meat alternatives made with soy or textured vegetable protein  Egg whites or egg substitute Cold cuts made with lean meat or soy protein  Dairy Nonfat (skim), low-fat, or 1%-fat milk  Nonfat or low-fat yogurt or cottage cheese Fat-free and low-fat cheese  Vegetables Fresh, frozen, or canned vegetables without added fat or salt   Fruits Fresh, frozen, canned, or dried fruit   Oils Unsaturated oils (corn, olive, peanut, soy, sunflower, canola)  Soft or liquid margarines and vegetable oil spreads  Salad dressings Seeds and nuts  Avocado   Foods Not Recommended Food Group Foods Not Recommended  Grains Breads or crackers topped with salt Cereals (hot or cold) with more than 300 mg sodium per serving Biscuits, cornbread, and other "quick" breads prepared with baking soda Bread crumbs or stuffing mix from a store High-fat bakery products, such as  doughnuts, biscuits, croissants, danish pastries, pies, cookies Instant cooking foods to which you add hot water and stir--potatoes, noodles, rice, etc. Packaged starchy foods--seasoned noodle or rice dishes, stuffing mix, macaroni and cheese dinner Snacks made with partially hydrogenated oils, including chips, cheese puffs, snack mixes, regular crackers, butter-flavored popcorn  Protein Foods Higher-fat cuts of meats (ribs, t-bone steak, regular hamburger) Bacon, sausage, or hot dogs Cold cuts, such as salami or bologna, deli meats, cured meats, corned beef Organ meats (liver, brains, gizzards, sweetbreads) Poultry with skin Fried or smoked meat, poultry, and fish Whole eggs and egg yolks (more than 2-4 per week) Salted legumes, nuts, seeds, or nut/seed butters Meat alternatives with high levels of sodium (>300 mg per serving) or saturated fat (>5 g per serving)  Dairy Whole  milk,?2% fat milk, buttermilk Whole milk yogurt or ice cream Cream Half-&-half Cream cheese Sour cream Cheese  Vegetables Canned or frozen vegetables with salt, fresh vegetables prepared with salt, butter, cheese, or cream sauce Fried vegetables Pickled vegetables such as olives, pickles, or sauerkraut  Fruits Fried fruits Fruits served with butter or cream  Oils Butter, stick margarine, shortening Partially hydrogenated oils or trans fats Tropical oils (coconut, palm, palm kernel oils)  Other Candy, sugar sweetened soft drinks and desserts Salt, sea salt, garlic salt, and seasoning mixes containing salt Bouillon cubes Ketchup, barbecue sauce, Worcestershire sauce, soy sauce, teriyaki sauce Miso Salsa Pickles, olives, relish   Heart Healthy Consistent Carbohydrate Vegetarian (Lacto-Ovo) Sample 1-Day Menu  Breakfast 1 cup oatmeal, cooked (2 carbohydrate servings)   cup blueberries (1 carbohydrate serving)  11 almonds, without salt  1 cup 1% milk (1 carbohydrate serving)  1 cup coffee  Morning Snack 1 cup fat-free plain yogurt (1 carbohydrate serving)  Lunch 1 whole wheat bun (1 carbohydrate servings)  1 black bean burger (1 carbohydrate servings)  1 slice cheddar cheese, low sodium  2 slices tomatoes  2 leaves lettuce  1 teaspoon mustard  1 small pear (1 carbohydrate servings)  1 cup green tea, unsweetened  Afternoon Snack 1/3 cup trail mix with nuts, seeds, and raisins, without salt (1 carbohydrate servinga)  Evening Meal  cup meatless chicken  2/3 cup brown rice, cooked (2 carbohydrate servings)  1 cup broccoli, cooked (2/3 carbohydrate serving)   cup carrots, cooked (1/3 carbohydrate serving)  2 teaspoons olive oil  1 teaspoon balsamic vinegar  1 whole wheat dinner roll (1 carbohydrate serving)  1 teaspoon margarine, soft, tub  1 cup 1% milk (1 carbohydrate serving)  Evening Snack 1 extra small banana (1 carbohydrate serving)  1 tablespoon peanut butter    Heart Healthy Consistent Carbohydrate Vegan Sample 1-Day Menu  Breakfast 1 cup oatmeal, cooked (2 carbohydrate servings)   cup blueberries (1 carbohydrate serving)  11 almonds, without salt  1 cup soymilk fortified with calcium, vitamin B12, and vitamin D  1 cup coffee  Morning Snack 6 ounces soy yogurt (1 carbohydrate servings)  Lunch 1 whole wheat bun(1 carbohydrate servings)  1 black bean burger (1 carbohydrate serving)  2 slices tomatoes  2 leaves lettuce  1 teaspoon mustard  1 small pear (1 carbohydrate servings)  1 cup green tea, unsweetened  Afternoon Snack 1/3 cup trail mix with nuts, seeds, and raisins, without salt (1 carbohydrate servings)  Evening Meal  cup meatless chicken  2/3 cup brown rice, cooked (2 carbohydrate servings)  1 cup broccoli, cooked (2/3 carbohydrate serving)   cup carrots, cooked (1/3 carbohydrate serving)  2 teaspoons olive oil  1  teaspoon balsamic vinegar  1 whole wheat dinner roll (1 carbohydrate serving)  1 teaspoon margarine, soft, tub  1 cup soymilk fortified with calcium, vitamin B12, and vitamin D  Evening Snack 1 extra small banana (1 carbohydrate serving)  1 tablespoon peanut butter    Heart Healthy Consistent Carbohydrate Sample 1-Day Menu  Breakfast 1 cup cooked oatmeal (2 carbohydrate servings)  3/4 cup blueberries (1 carbohydrate serving)  1 ounce almonds  1 cup skim milk (1 carbohydrate serving)  1 cup coffee  Morning Snack 1 cup sugar-free nonfat yogurt (1 carbohydrate serving)  Lunch 2 slices whole-wheat bread (2 carbohydrate servings)  2 ounces lean [place] breast  1 ounce low-fat Swiss cheese  1 teaspoon mustard  1 slice tomato  1 lettuce leaf  1 small pear (1 carbohydrate serving)  1 cup skim milk (1 carbohydrate serving)  Afternoon Snack 1 ounce trail mix with unsalted nuts, seeds, and raisins (1 carbohydrate serving)  Evening Meal 3 ounces salmon  2/3 cup cooked brown rice (2 carbohydrate servings)  1 teaspoon  soft margarine  1 cup cooked broccoli with 1/2 cup cooked carrots (1 carbohydrate serving  Carrots, cooked, boiled, drained, without salt  1 cup lettuce  1 teaspoon olive oil with vinegar for dressing  1 small whole grain roll (1 carbohydrate serving)  1 teaspoon soft margarine  1 cup unsweetened tea  Evening Snack 1 extra-small banana (1 carbohydrate serving)  Copyright 2020  [employer]. All rights reserved.  Discharge Instructions:  1. You may shower, please wash incisions daily with soap and water and keep dry.  If you wish to cover wounds with dressing you may do so but please keep clean and change daily.  No tub baths or swimming until incisions have completely healed.  If your incisions become red or develop any drainage please call our office at [phone]  2. No Driving until cleared by our office and you are no longer using narcotic pain medications  3. Monitor your weight daily.. Please use the same scale and weigh at same time... If you gain 3-5 lbs in 48 hours with associated lower extremity swelling, please contact our office at [phone]  4. Fever of 101.5 for at least 24 hours with no source, please contact our office at [phone]  5. Activity- up as tolerated, please walk at least 3 times per day.  Avoid strenuous activity, no lifting, pushing, or pulling with your arms over 8-10 lbs for a minimum of 6 weeks  6. If any questions or concerns arise, please do not hesitate to contact our office at [phone]

## 2019-11-22 NOTE — Progress Notes (Signed)
Pt pulled cpap off with his mitts on. He asked why he needed the mitts. It was advised so that he would not pull the cpap off. He knows he's in the hospital and at [facility]. Reapplied cpap, bed alarm on, fall mats beside the bed. Will continue to monitor patient.

## 2019-11-23 LAB — CBC WITH DIFFERENTIAL/PLATELET
Abs Immature Granulocytes: 0.02 10*3/uL (ref 0.00–0.07)
Basophils Absolute: 0.1 10*3/uL (ref 0.0–0.1)
Basophils Relative: 1 %
Eosinophils Absolute: 0 10*3/uL (ref 0.0–0.5)
Eosinophils Relative: 0 %
HCT: 48.6 % (ref 39.0–52.0)
Hemoglobin: 15.6 g/dL (ref 13.0–17.0)
Immature Granulocytes: 0 %
Lymphocytes Relative: 25 %
Lymphs Abs: 2.4 10*3/uL (ref 0.7–4.0)
MCH: 28.4 pg (ref 26.0–34.0)
MCHC: 32.1 g/dL (ref 30.0–36.0)
MCV: 88.5 fL (ref 80.0–100.0)
Monocytes Absolute: 0.9 10*3/uL (ref 0.1–1.0)
Monocytes Relative: 10 %
Neutro Abs: 6.2 10*3/uL (ref 1.7–7.7)
Neutrophils Relative %: 64 %
Platelets: 242 10*3/uL (ref 150–400)
RBC: 5.49 MIL/uL (ref 4.22–5.81)
RDW: 14.6 % (ref 11.5–15.5)
WBC: 9.6 10*3/uL (ref 4.0–10.5)
nRBC: 0 % (ref 0.0–0.2)

## 2019-11-23 LAB — BASIC METABOLIC PANEL
Anion gap: 8 (ref 5–15)
BUN: 40 mg/dL — ABNORMAL HIGH (ref 6–20)
CO2: 25 mmol/L (ref 22–32)
Calcium: 8.3 mg/dL — ABNORMAL LOW (ref 8.9–10.3)
Chloride: 103 mmol/L (ref 98–111)
Creatinine, Ser: 1.01 mg/dL (ref 0.61–1.24)
GFR calc Af Amer: >60 mL/min (ref >60)
GFR calc non Af Amer: >60 mL/min (ref >60)
Glucose, Bld: 222 mg/dL — ABNORMAL HIGH (ref 70–99)
Potassium: 3.9 mmol/L (ref 3.5–5.1)
Sodium: 136 mmol/L (ref 135–145)

## 2019-11-23 LAB — GLUCOSE, CAPILLARY
Glucose-Capillary: 163 mg/dL — ABNORMAL HIGH (ref 70–99)
Glucose-Capillary: 209 mg/dL — ABNORMAL HIGH (ref 70–99)
Glucose-Capillary: 250 mg/dL — ABNORMAL HIGH (ref 70–99)
Glucose-Capillary: 265 mg/dL — ABNORMAL HIGH (ref 70–99)
Glucose-Capillary: 276 mg/dL — ABNORMAL HIGH (ref 70–99)
Glucose-Capillary: 85 mg/dL (ref 70–99)

## 2019-11-23 LAB — MAGNESIUM: Magnesium: 2.2 mg/dL (ref 1.7–2.4)

## 2019-11-23 LAB — BRAIN NATRIURETIC PEPTIDE: B Natriuretic Peptide: 1054 pg/mL — ABNORMAL HIGH (ref 0.0–100.0)

## 2019-11-23 MED ORDER — METOPROLOL SUCCINATE ER 50 MG PO TB24
75.0000 mg | ORAL_TABLET | Freq: Every day | ORAL | Status: DC
  Administered 2019-11-23 – 2019-11-24 (×2): 75 mg via ORAL
  Filled 2019-11-23 (×2): qty 1

## 2019-11-23 MED ORDER — LIVING WELL WITH DIABETES BOOK
Freq: Once | Status: AC
  Administered 2019-11-23: 1

## 2019-11-23 MED ORDER — INSULIN ASPART 100 UNIT/ML ~~LOC~~ SOLN
2.0000 [IU] | Freq: Three times a day (TID) | SUBCUTANEOUS | Status: DC
  Administered 2019-11-23 – 2019-11-25 (×5): 2 [IU] via SUBCUTANEOUS

## 2019-11-23 MED ORDER — DIGOXIN 125 MCG PO TABS
0.1250 mg | ORAL_TABLET | Freq: Every day | ORAL | Status: DC
  Administered 2019-11-23 – 2019-11-25 (×2): 0.125 mg via ORAL
  Filled 2019-11-23 (×3): qty 1

## 2019-11-23 MED ORDER — SODIUM CHLORIDE 0.9% FLUSH
3.0000 mL | Freq: Two times a day (BID) | INTRAVENOUS | Status: DC
  Administered 2019-11-23 – 2019-11-24 (×2): 3 mL via INTRAVENOUS

## 2019-11-23 NOTE — H&P (View-Only) (Signed)
Progress Note  Patient Name: Kevin Underwood Date of Encounter: 11/23/2019  Consulting Cardiologist: Rozann Lesches, MD  Subjective   Feels a little better today.  Slept last night with CPAP.  No chest pain or palpitations.  Inpatient Medications    Scheduled Meds: . apixaban  5 mg Oral BID  . Chlorhexidine Gluconate Cloth  6 each Topical Q0600  . dextrose      . furosemide  40 mg Intravenous Q12H  . insulin aspart  0-15 Units Subcutaneous TID WC  . insulin aspart  0-5 Units Subcutaneous QHS  . insulin glargine  8 Units Subcutaneous Daily  . ipratropium-albuterol  3 mL Inhalation BID  . metoprolol succinate  50 mg Oral Daily  . mometasone-formoterol  2 puff Inhalation BID  . potassium chloride  10 mEq Oral BID  . spironolactone  12.5 mg Oral Daily    PRN Meds: albuterol, gabapentin, haloperidol lactate, LORazepam, nicotine   Vital Signs    Vitals:   11/23/19 0200 11/23/19 0300 11/23/19 0400 11/23/19 0807  BP: 119/82 109/77 (!) 132/105   Pulse: 98 (!) 34 (!) 25 (!) 110  Resp: 13 17 17  (!) 21  Temp:    97.6 F (36.4 C)  TempSrc:    Oral  SpO2: 99% (!) 83% 97% 95%  Weight:      Height:        Intake/Output Summary (Last 24 hours) at 11/23/2019 0829 Last data filed at 11/23/2019 4235 Gross per 24 hour  Intake 737 ml  Output 1750 ml  Net -1013 ml   Filed Weights   11/20/19 0919 11/22/19 0500  Weight: 79.4 kg 88.1 kg    Telemetry    Sinus rhythm and sinus tachycardia with NSVT and PACs.  Personally reviewed.  Physical Exam   GEN: No acute distress.   Neck:  Elevated JVP. Cardiac:  Irregularly irregular, no gallop.  Respiratory:  Decreased breath sounds at the bases. GI: Soft, nontender, bowel sounds present. MS:  Improving leg edema; No deformity. Neuro:  Nonfocal. Psych: Alert and oriented x 3. Normal affect.  Labs    Chemistry Recent Labs  Lab 11/20/19 [id number] 11/21/19 0513 11/22/19 0414 11/23/19 0453  NA 132* 134* 138 136  K 4.5 4.7  3.6 3.9  CL 96* 101 103 103  CO2 24 19* 23 25  GLUCOSE 407* 181* 65* 222*  BUN 16 30* 43* 40*  CREATININE 0.94 1.04 0.90 1.01  CALCIUM 8.6* 8.4* 8.7* 8.3*  PROT 6.2*  --   --   --   ALBUMIN 3.3*  --   --   --   AST 28  --   --   --   ALT 48*  --   --   --   ALKPHOS 92  --   --   --   BILITOT 0.6  --   --   --   GFRNONAA >60 >60 >60 >60  GFRAA >60 >60 >60 >60  ANIONGAP 12 14 12 8      Hematology Recent Labs  Lab 11/21/19 0513 11/22/19 0414 11/23/19 0453  WBC 14.5* 11.4* 9.6  RBC 5.96* 5.50 5.49  HGB 16.9 15.9 15.6  HCT 53.8* 48.1 48.6  MCV 90.3 87.5 88.5  MCH 28.4 28.9 28.4  MCHC 31.4 33.1 32.1  RDW 14.2 14.2 14.6  PLT 277 268 242    Cardiac Enzymes Recent Labs  Lab 11/20/19 0937 11/20/19 1111  TROPONINIHS 34* 32*    BNP Recent Labs  Lab  11/21/19 0514 11/22/19 0414 11/23/19 0453  BNP 1,285.0* 578.0* 1,054.0*     DDimer Recent Labs  Lab 11/20/19 0937  DDIMER 2.61*     Radiology    US Abdomen Complete  Result Date: 11/21/2019 CLINICAL DATA:  Right upper quadrant pain. EXAM: ABDOMEN ULTRASOUND COMPLETE COMPARISON:  None. FINDINGS: Gallbladder: Gallbladder wall is thickened to 6-7 mm. Gallbladder mild to moderately distended. No stones. Common bile duct: Diameter: 5 mm Liver: Borderline enlarged. Normal parenchymal echogenicity. No mass or focal lesion. Portal vein is patent on color Doppler imaging with normal direction of blood flow towards the liver. IVC: No abnormality visualized. Pancreas: Obscured by midline bowel gas. Spleen: Size and appearance within normal limits. Right Kidney: Length: 13.3 cm.  There is Left Kidney: Length: 12.3 cm. Echogenicity within normal limits. No mass or hydronephrosis visualized. Abdominal aorta: No aneurysm visualized. Other findings: Ascites seen adjacent to the liver and spleen and gallbladder. Small right pleural effusion. IMPRESSION: 1. No acute findings. 2. Gallbladder wall thickened, but this is nonspecific in the  setting of ascites. No gallstones. No findings to suggest acute cholecystitis. 3. Least a small amount of ascites. The liver is borderline enlarged but otherwise normal in appearance. There is no splenomegaly. 4. Pancreas not evaluated, obscured by overlying bowel gas. Electronically Signed   By: David  Ormond M.D.   On: 11/21/2019 12:42   US Venous Img Lower Unilateral Right (DVT)  Result Date: 11/21/2019 CLINICAL DATA:  Right lower extremity swelling. EXAM: Right LOWER EXTREMITY VENOUS DOPPLER ULTRASOUND TECHNIQUE: Gray-scale sonography with graded compression, as well as color Doppler and duplex ultrasound were performed to evaluate the lower extremity deep venous systems from the level of the common femoral vein and including the common femoral, femoral, profunda femoral, popliteal and calf veins including the posterior tibial, peroneal and gastrocnemius veins when visible. The superficial great saphenous vein was also interrogated. Spectral Doppler was utilized to evaluate flow at rest and with distal augmentation maneuvers in the common femoral, femoral and popliteal veins. COMPARISON:  None. FINDINGS: Contralateral Common Femoral Vein: Respiratory phasicity is normal and symmetric with the symptomatic side. No evidence of thrombus. Normal compressibility. Common Femoral Vein: No evidence of thrombus. Normal compressibility, respiratory phasicity and response to augmentation. Saphenofemoral Junction: No evidence of thrombus. Normal compressibility and flow on color Doppler imaging. Profunda Femoral Vein: No evidence of thrombus. Normal compressibility and flow on color Doppler imaging. Femoral Vein: No evidence of thrombus. Normal compressibility, respiratory phasicity and response to augmentation. Popliteal Vein: No evidence of thrombus. Normal compressibility, respiratory phasicity and response to augmentation. Calf Veins: No evidence of thrombus. Normal compressibility and flow on color Doppler  imaging. Superficial Great Saphenous Vein: No evidence of thrombus. Normal compressibility. Venous Reflux:  None. Other Findings:  None. IMPRESSION: No definite evidence of deep venous thrombosis seen in right lower extremity. Electronically Signed   By: Sharrieff  Green Jr M.D.   On: 11/21/2019 12:44   ECHOCARDIOGRAM COMPLETE  Result Date: 11/21/2019   ECHOCARDIOGRAM REPORT   Patient Name:   Dorrell E Mergen Date of Exam: 11/21/2019 Medical Rec #:  [id number]        Height:       69.0 in Accession #:    [id number]       Weight:       175.0 lb Date of Birth:  05/06/1961        BSA:          1.95 m Patient Age:    58   years         BP:           104/87 mmHg Patient Gender: M                HR:           91 bpm. [facility] Procedure: 2D Echo Indications:    Chest Pain 786.50 / R07.9  History:        Patient has no prior history of Echocardiogram examinations.                 CHF, COPD, Arrythmias:Atrial Fibrillation; Risk                 Factors:Diabetes, Current Smoker and Hypertension.  Sonographer:    Jeryl Columbia RDCS (AE) Referring Phys: [id number] CLANFORD L JOHNSON IMPRESSIONS  1. Left ventricular ejection fraction, by visual estimation, is <20%. The left ventricle has severely decreased function. There is no left ventricular hypertrophy.  2. Left ventricular diastolic parameters are indeterminate.  3. The left ventricle demonstrates global hypokinesis.  4. Global right ventricle has mildly reduced systolic function.The right ventricular size is mildly enlarged. No increase in right ventricular wall thickness.  5. Left atrial size was moderately dilated.  6. Right atrial size was severely dilated.  7. The mitral valve is abnormal. Mild mitral valve regurgitation.  8. The tricuspid valve is grossly normal. Tricuspid valve regurgitation is mild.  9. The aortic valve is tricuspid. Aortic valve regurgitation is not visualized. 10. The pulmonic valve was grossly normal. Pulmonic valve regurgitation is  trivial. 11. Moderately elevated pulmonary artery systolic pressure. 12. The inferior vena cava is dilated in size with <50% respiratory variability, suggesting right atrial pressure of 15 mmHg. FINDINGS  Left Ventricle: Left ventricular ejection fraction, by visual estimation, is <20%. The left ventricle has severely decreased function. The left ventricle demonstrates global hypokinesis. There is no left ventricular hypertrophy. Left ventricular diastolic parameters are indeterminate. Right Ventricle: The right ventricular size is mildly enlarged. No increase in right ventricular wall thickness. Global RV systolic function is has mildly reduced systolic function. The tricuspid regurgitant velocity is 2.97 m/s, and with an assumed right atrial pressure of 15 mmHg, the estimated right ventricular systolic pressure is moderately elevated at 50.3 mmHg. Left Atrium: Left atrial size was moderately dilated. Right Atrium: Right atrial size was severely dilated Pericardium: There is no evidence of pericardial effusion. Mitral Valve: The mitral valve is abnormal. There is mild thickening of the mitral valve leaflet(s). Mild mitral valve regurgitation. Tricuspid Valve: The tricuspid valve is grossly normal. Tricuspid valve regurgitation is mild. Aortic Valve: The aortic valve is tricuspid. Aortic valve regurgitation is not visualized. Pulmonic Valve: The pulmonic valve was grossly normal. Pulmonic valve regurgitation is trivial. Pulmonic regurgitation is trivial. Aorta: The aortic root and ascending aorta are structurally normal, with no evidence of dilitation. Venous: The inferior vena cava is dilated in size with less than 50% respiratory variability, suggesting right atrial pressure of 15 mmHg. IAS/Shunts: No atrial level shunt detected by color flow Doppler.  LEFT VENTRICLE PLAX 2D LVIDd:         5.18 cm  Diastology LVIDs:         4.54 cm  LV e' lateral: 5.57 cm/s LV PW:         1.46 cm  LV e' medial:  3.23 cm/s LV IVS:         1.05 cm LVOT diam:  2.00 cm LV SV:         34 ml LV SV Index:   17.18 LVOT Area:     3.14 cm  RIGHT VENTRICLE RV S prime:     9.96 cm/s TAPSE (M-mode): 1.1 cm LEFT ATRIUM              Index       RIGHT ATRIUM           Index LA diam:        4.50 cm  2.31 cm/m  RA Area:     25.00 cm LA Vol (A2C):   103.0 ml 52.77 ml/m RA Volume:   95.20 ml  48.77 ml/m LA Vol (A4C):   76.4 ml  39.14 ml/m LA Biplane Vol: 92.2 ml  47.23 ml/m   AORTA Ao Root diam: 3.50 cm MR Peak grad: 71.9 mmHg   TRICUSPID VALVE MR Mean grad: 47.0 mmHg   TR Peak grad:   35.3 mmHg MR Vmax:      424.00 cm/s TR Vmax:        297.00 cm/s MR Vmean:     321.0 cm/s                           SHUNTS                           Systemic Diam: 2.00 cm  Zoila Shutter MD Electronically signed by Zoila Shutter MD Signature Date/Time: 11/21/2019/11:01:15 AM    Final     Patient Profile     58 y.o. male with a history of hypertension, poorly controlled type 2 diabetes mellitus, tobacco abuse with COPD, and newly documented cardiomyopathy with paroxysmal atrial fibrillation with RVR.  Assessment & Plan    1.  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  Currently on Toprol-XL and Aldactone.  ACE inhibitor discontinued with plan to consider Entresto eventually.  2.  Acute systolic heart failure, improving with diuresis but still has evidence of fluid overload.  He diuresed approximately 1900 cc out more than in last 24 hours.  Continues on Lasix 40 mg IV twice daily.  3.  Poorly controlled type 2 diabetes mellitus, hemoglobin A1c 12.1%.  Currently on insulin per primary team.  4.  Paroxysmal atrial fibrillation, in sinus rhythm at this time.  CHA2DS2-VASc score is 3.  He is on Eliquis.  5.  OSA suspected with apneic episodes  observed overnight, now using CPAP.  He has not had a sleep study.  Situation reviewed with patient.  We will communicate with the hospitalist team.  Recommend transfer to [facility] for involvement of the advanced heart failure team and also a right and left heart catheterization.  It would be better to clarify etiology of cardiomyopathy as best we can upfront and make medication adjustments from there with resumption of anticoagulation.  Hold Eliquis.  Increase Toprol-XL to 75 mg daily.  Continue Aldactone and current dose of IV Lasix.  Add low-dose Lanoxin.  Signed, Nona Dell, MD  11/23/2019, 8:29 AM

## 2019-11-23 NOTE — Progress Notes (Signed)
Progress Note  Patient Name: Kevin Underwood Date of Encounter: 11/23/2019  Consulting Cardiologist: Rozann Lesches, MD  Subjective   Feels a little better today.  Slept last night with CPAP.  No chest pain or palpitations.  Inpatient Medications    Scheduled Meds: . apixaban  5 mg Oral BID  . Chlorhexidine Gluconate Cloth  6 each Topical Q0600  . dextrose      . furosemide  40 mg Intravenous Q12H  . insulin aspart  0-15 Units Subcutaneous TID WC  . insulin aspart  0-5 Units Subcutaneous QHS  . insulin glargine  8 Units Subcutaneous Daily  . ipratropium-albuterol  3 mL Inhalation BID  . metoprolol succinate  50 mg Oral Daily  . mometasone-formoterol  2 puff Inhalation BID  . potassium chloride  10 mEq Oral BID  . spironolactone  12.5 mg Oral Daily    PRN Meds: albuterol, gabapentin, haloperidol lactate, LORazepam, nicotine   Vital Signs    Vitals:   11/23/19 0200 11/23/19 0300 11/23/19 0400 11/23/19 0807  BP: 119/82 109/77 (!) 132/105   Pulse: 98 (!) 34 (!) 25 (!) 110  Resp: 13 17 17  (!) 21  Temp:    97.6 F (36.4 C)  TempSrc:    Oral  SpO2: 99% (!) 83% 97% 95%  Weight:      Height:        Intake/Output Summary (Last 24 hours) at 11/23/2019 0829 Last data filed at 11/23/2019 4235 Gross per 24 hour  Intake 737 ml  Output 1750 ml  Net -1013 ml   Filed Weights   11/20/19 0919 11/22/19 0500  Weight: 79.4 kg 88.1 kg    Telemetry    Sinus rhythm and sinus tachycardia with NSVT and PACs.  Personally reviewed.  Physical Exam   GEN: No acute distress.   Neck:  Elevated JVP. Cardiac:  Irregularly irregular, no gallop.  Respiratory:  Decreased breath sounds at the bases. GI: Soft, nontender, bowel sounds present. MS:  Improving leg edema; No deformity. Neuro:  Nonfocal. Psych: Alert and oriented x 3. Normal affect.  Labs    Chemistry Recent Labs  Lab 11/20/19 [id number] 11/21/19 0513 11/22/19 0414 11/23/19 0453  NA 132* 134* 138 136  K 4.5 4.7  3.6 3.9  CL 96* 101 103 103  CO2 24 19* 23 25  GLUCOSE 407* 181* 65* 222*  BUN 16 30* 43* 40*  CREATININE 0.94 1.04 0.90 1.01  CALCIUM 8.6* 8.4* 8.7* 8.3*  PROT 6.2*  --   --   --   ALBUMIN 3.3*  --   --   --   AST 28  --   --   --   ALT 48*  --   --   --   ALKPHOS 92  --   --   --   BILITOT 0.6  --   --   --   GFRNONAA >60 >60 >60 >60  GFRAA >60 >60 >60 >60  ANIONGAP 12 14 12 8      Hematology Recent Labs  Lab 11/21/19 0513 11/22/19 0414 11/23/19 0453  WBC 14.5* 11.4* 9.6  RBC 5.96* 5.50 5.49  HGB 16.9 15.9 15.6  HCT 53.8* 48.1 48.6  MCV 90.3 87.5 88.5  MCH 28.4 28.9 28.4  MCHC 31.4 33.1 32.1  RDW 14.2 14.2 14.6  PLT 277 268 242    Cardiac Enzymes Recent Labs  Lab 11/20/19 0937 11/20/19 1111  TROPONINIHS 34* 32*    BNP Recent Labs  Lab  11/21/19 0514 11/22/19 0414 11/23/19 0453  BNP 1,285.0* 578.0* 1,054.0*     DDimer Recent Labs  Lab 11/20/19 0937  DDIMER 2.61*     Radiology    US Abdomen Complete  Result Date: 11/21/2019 CLINICAL DATA:  Right upper quadrant pain. EXAM: ABDOMEN ULTRASOUND COMPLETE COMPARISON:  None. FINDINGS: Gallbladder: Gallbladder wall is thickened to 6-7 mm. Gallbladder mild to moderately distended. No stones. Common bile duct: Diameter: 5 mm Liver: Borderline enlarged. Normal parenchymal echogenicity. No mass or focal lesion. Portal vein is patent on color Doppler imaging with normal direction of blood flow towards the liver. IVC: No abnormality visualized. Pancreas: Obscured by midline bowel gas. Spleen: Size and appearance within normal limits. Right Kidney: Length: 13.3 cm.  There is Left Kidney: Length: 12.3 cm. Echogenicity within normal limits. No mass or hydronephrosis visualized. Abdominal aorta: No aneurysm visualized. Other findings: Ascites seen adjacent to the liver and spleen and gallbladder. Small right pleural effusion. IMPRESSION: 1. No acute findings. 2. Gallbladder wall thickened, but this is nonspecific in the  setting of ascites. No gallstones. No findings to suggest acute cholecystitis. 3. Least a small amount of ascites. The liver is borderline enlarged but otherwise normal in appearance. There is no splenomegaly. 4. Pancreas not evaluated, obscured by overlying bowel gas. Electronically Signed   By: Amie Portlandavid  Ormond M.D.   On: 11/21/2019 12:42   US Venous Img Lower Unilateral Right (DVT)  Result Date: 11/21/2019 CLINICAL DATA:  Right lower extremity swelling. EXAM: Right LOWER EXTREMITY VENOUS DOPPLER ULTRASOUND TECHNIQUE: Gray-scale sonography with graded compression, as well as color Doppler and duplex ultrasound were performed to evaluate the lower extremity deep venous systems from the level of the common femoral vein and including the common femoral, femoral, profunda femoral, popliteal and calf veins including the posterior tibial, peroneal and gastrocnemius veins when visible. The superficial great saphenous vein was also interrogated. Spectral Doppler was utilized to evaluate flow at rest and with distal augmentation maneuvers in the common femoral, femoral and popliteal veins. COMPARISON:  None. FINDINGS: Contralateral Common Femoral Vein: Respiratory phasicity is normal and symmetric with the symptomatic side. No evidence of thrombus. Normal compressibility. Common Femoral Vein: No evidence of thrombus. Normal compressibility, respiratory phasicity and response to augmentation. Saphenofemoral Junction: No evidence of thrombus. Normal compressibility and flow on color Doppler imaging. Profunda Femoral Vein: No evidence of thrombus. Normal compressibility and flow on color Doppler imaging. Femoral Vein: No evidence of thrombus. Normal compressibility, respiratory phasicity and response to augmentation. Popliteal Vein: No evidence of thrombus. Normal compressibility, respiratory phasicity and response to augmentation. Calf Veins: No evidence of thrombus. Normal compressibility and flow on color Doppler  imaging. Superficial Great Saphenous Vein: No evidence of thrombus. Normal compressibility. Venous Reflux:  None. Other Findings:  None. IMPRESSION: No definite evidence of deep venous thrombosis seen in right lower extremity. Electronically Signed   By: Lupita RaiderJames  Green Jr M.D.   On: 11/21/2019 12:44   ECHOCARDIOGRAM COMPLETE  Result Date: 11/21/2019   ECHOCARDIOGRAM REPORT   Patient Name:   Phoebe SharpsJAMES E Hawn Date of Exam: 11/21/2019 Medical Rec #:  [id number]        Height:       69.0 in Accession #:    [id number]       Weight:       175.0 lb Date of Birth:  07-23-1961        BSA:          1.95 m Patient Age:    89  years         BP:           104/87 mmHg Patient Gender: M                HR:           91 bpm. [facility] Procedure: 2D Echo Indications:    Chest Pain 786.50 / R07.9  History:        Patient has no prior history of Echocardiogram examinations.                 CHF, COPD, Arrythmias:Atrial Fibrillation; Risk                 Factors:Diabetes, Current Smoker and Hypertension.  Sonographer:    Jeryl Columbia RDCS (AE) Referring Phys: [id number] CLANFORD L JOHNSON IMPRESSIONS  1. Left ventricular ejection fraction, by visual estimation, is <20%. The left ventricle has severely decreased function. There is no left ventricular hypertrophy.  2. Left ventricular diastolic parameters are indeterminate.  3. The left ventricle demonstrates global hypokinesis.  4. Global right ventricle has mildly reduced systolic function.The right ventricular size is mildly enlarged. No increase in right ventricular wall thickness.  5. Left atrial size was moderately dilated.  6. Right atrial size was severely dilated.  7. The mitral valve is abnormal. Mild mitral valve regurgitation.  8. The tricuspid valve is grossly normal. Tricuspid valve regurgitation is mild.  9. The aortic valve is tricuspid. Aortic valve regurgitation is not visualized. 10. The pulmonic valve was grossly normal. Pulmonic valve regurgitation is  trivial. 11. Moderately elevated pulmonary artery systolic pressure. 12. The inferior vena cava is dilated in size with <50% respiratory variability, suggesting right atrial pressure of 15 mmHg. FINDINGS  Left Ventricle: Left ventricular ejection fraction, by visual estimation, is <20%. The left ventricle has severely decreased function. The left ventricle demonstrates global hypokinesis. There is no left ventricular hypertrophy. Left ventricular diastolic parameters are indeterminate. Right Ventricle: The right ventricular size is mildly enlarged. No increase in right ventricular wall thickness. Global RV systolic function is has mildly reduced systolic function. The tricuspid regurgitant velocity is 2.97 m/s, and with an assumed right atrial pressure of 15 mmHg, the estimated right ventricular systolic pressure is moderately elevated at 50.3 mmHg. Left Atrium: Left atrial size was moderately dilated. Right Atrium: Right atrial size was severely dilated Pericardium: There is no evidence of pericardial effusion. Mitral Valve: The mitral valve is abnormal. There is mild thickening of the mitral valve leaflet(s). Mild mitral valve regurgitation. Tricuspid Valve: The tricuspid valve is grossly normal. Tricuspid valve regurgitation is mild. Aortic Valve: The aortic valve is tricuspid. Aortic valve regurgitation is not visualized. Pulmonic Valve: The pulmonic valve was grossly normal. Pulmonic valve regurgitation is trivial. Pulmonic regurgitation is trivial. Aorta: The aortic root and ascending aorta are structurally normal, with no evidence of dilitation. Venous: The inferior vena cava is dilated in size with less than 50% respiratory variability, suggesting right atrial pressure of 15 mmHg. IAS/Shunts: No atrial level shunt detected by color flow Doppler.  LEFT VENTRICLE PLAX 2D LVIDd:         5.18 cm  Diastology LVIDs:         4.54 cm  LV e' lateral: 5.57 cm/s LV PW:         1.46 cm  LV e' medial:  3.23 cm/s LV IVS:         1.05 cm LVOT diam:  2.00 cm LV SV:         34 ml LV SV Index:   17.18 LVOT Area:     3.14 cm  RIGHT VENTRICLE RV S prime:     9.96 cm/s TAPSE (M-mode): 1.1 cm LEFT ATRIUM              Index       RIGHT ATRIUM           Index LA diam:        4.50 cm  2.31 cm/m  RA Area:     25.00 cm LA Vol (A2C):   103.0 ml 52.77 ml/m RA Volume:   95.20 ml  48.77 ml/m LA Vol (A4C):   76.4 ml  39.14 ml/m LA Biplane Vol: 92.2 ml  47.23 ml/m   AORTA Ao Root diam: 3.50 cm MR Peak grad: 71.9 mmHg   TRICUSPID VALVE MR Mean grad: 47.0 mmHg   TR Peak grad:   35.3 mmHg MR Vmax:      424.00 cm/s TR Vmax:        297.00 cm/s MR Vmean:     321.0 cm/s                           SHUNTS                           Systemic Diam: 2.00 cm  Zoila Shutter MD Electronically signed by Zoila Shutter MD Signature Date/Time: 11/21/2019/11:01:15 AM    Final     Patient Profile     58 y.o. male with a history of hypertension, poorly controlled type 2 diabetes mellitus, tobacco abuse with COPD, and newly documented cardiomyopathy with paroxysmal atrial fibrillation with RVR.  Assessment & Plan    1.  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  Currently on Toprol-XL and Aldactone.  ACE inhibitor discontinued with plan to consider Entresto eventually.  2.  Acute systolic heart failure, improving with diuresis but still has evidence of fluid overload.  He diuresed approximately 1900 cc out more than in last 24 hours.  Continues on Lasix 40 mg IV twice daily.  3.  Poorly controlled type 2 diabetes mellitus, hemoglobin A1c 12.1%.  Currently on insulin per primary team.  4.  Paroxysmal atrial fibrillation, in sinus rhythm at this time.  CHA2DS2-VASc score is 3.  He is on Eliquis.  5.  OSA suspected with apneic episodes  observed overnight, now using CPAP.  He has not had a sleep study.  Situation reviewed with patient.  We will communicate with the hospitalist team.  Recommend transfer to [facility] for involvement of the advanced heart failure team and also a right and left heart catheterization.  It would be better to clarify etiology of cardiomyopathy as best we can upfront and make medication adjustments from there with resumption of anticoagulation.  Hold Eliquis.  Increase Toprol-XL to 75 mg daily.  Continue Aldactone and current dose of IV Lasix.  Add low-dose Lanoxin.  Signed, Nona Dell, MD  11/23/2019, 8:29 AM

## 2019-11-23 NOTE — Consult Note (Addendum)
Advanced Heart Failure Team Consult Note   Primary Physician: Kirstie PeriShah, Ashish, MD PCP-Cardiologist:  Nona DellSamuel McDowell, MD  AHF: Dr. Shirlee LatchMcLean ([place])   Reason for Consultation: New Systolic Heart Failure   HPI:    Phoebe SharpsJames E Underwood is seen today for evaluation of new systolic heart failure at the request of Dr. Diona BrownerMcDowell, General Cardiology.   PMH includes HTN, poorly controlled T2DM, tobacco abuse, COPD and prior h/o ETOH abuse. [facility] for P[facility]. He was prescribed outpatient abx but did not complete recommended course of therapy due to reported side effects w/ mediation.   On 12/12, [facility] w/ CC of SOB, bilateral leg edema and RUQ pain and was found to be in rapid atrial fibrillation and CHF.  BNP was 1,285. CXR demonstrated cardiomegaly w/ mild central pulmonary vascular congestion and small left pleural effusion. Chest CT showed no evidence of acute PE and no clear evidence of pulmonary infection. WBC ct was elevated at 14.5 but afebrile. TSH, K, Mg and SCr were all WNL. Hs troponin trend flat, 34>>32. Abdominal US was done for RUQ pain and demonstrated no signs of acute cholecystitis but cholelithiasis present. Hepatic enzymes were WNL.  He was admitted by IM and started on IV Cardizem for rate control of afib and IV Lasix for acute CHF. 2D echo showed severely reduced LVEF < 20% with global LV hypokinesis. RV is mildly dilated w/ mildly reduced systolic function. Bi atrial enlargement present (LA moderately dilated 4.5 cm, RA severely dialated), pulmonary artery systolic pressure moderately elevated at 50.3 mmHg. Mild MR and mild TR noted.   He has been treated w/ IV Lasix and started on GDMT w/ exception of an ACE/ARB/ARNi. BP has been soft in the low 100s systolic but he has been able to tolerate Toprol XL 75 mg and spironolactone 12.5 mg. Digoxin also added 0.125 mg daily. He has had paroxsymal afib this admit. He was  initially started on Eliquis 5 mb bid, but currently on hold for anticipated cath (last dose was 12/14 @2100 ).  He also appears to have OSA. Diagnosis made by clinical observation as he has been having apnea episodes up to 50 sec during sleep. He has been started on CPAP here in hospital.   [facility]. [facility] Shir[facility]Lean [facility].   He is currently CP free. No dyspnea at rest. LEE has resolved. He tells me he is a former smoker, quit 6 months ago. No FH of cardiac disease. He does endorse recent intermittent CP (described as chest tightness), but he has attributed this to his COPD.      Echo 11/21/19  IMPRESSIONS    1. Left ventricular ejection fraction, by visual estimation, is <20%. The left ventricle has severely decreased function. There is no left ventricular hypertrophy.  2. Left ventricular diastolic parameters are indeterminate.  3. The left ventricle demonstrates global hypokinesis.  4. Global right ventricle has mildly reduced systolic function.The right ventricular size is mildly enlarged. No increase in right ventricular wall thickness.  5. Left atrial size was moderately dilated.  6. Right atrial size was severely dilated.  7. The mitral valve is abnormal. Mild mitral valve regurgitation.  8. The tricuspid valve is grossly normal. Tricuspid valve regurgitation is mild.  9. The aortic valve is tricuspid. Aortic valve regurgitation is not visualized. 10. The pulmonic valve was grossly  normal. Pulmonic valve regurgitation is trivial. 11. Moderately elevated pulmonary artery systolic pressure. 12. The inferior vena cava is dilated in size with <50% respiratory variability, suggesting right atrial pressure of 15 mmHg.  Review of Systems: [y] = yes, [ ]  = no   . General: Weight gain [ ] ; Weight loss [ ] ; Anorexia [ ] ; Fatigue [ ] ; Fever [ ] ; Chills [ ] ; Weakness [ ]   . Cardiac: Chest pain/pressure [  ]; Resting SOB [ ] ; Exertional SOB [ ] ; Orthopnea [ ] ; Pedal Edema [ ] ; Palpitations [ ] ; Syncope [ ] ; Presyncope [ ] ; Paroxysmal nocturnal dyspnea[ ]   . Pulmonary: Cough [ ] ; Wheezing[ ] ; Hemoptysis[ ] ; Sputum [ ] ; Snoring [ ]   . GI: Vomiting[ ] ; Dysphagia[ ] ; Melena[ ] ; Hematochezia [ ] ; Heartburn[ ] ; Abdominal pain [ ] ; Constipation [ ] ; Diarrhea [ ] ; BRBPR [ ]   . GU: Hematuria[ ] ; Dysuria [ ] ; Nocturia[ ]   . Vascular: Pain in legs with walking [ ] ; Pain in feet with lying flat [ ] ; Non-healing sores [ ] ; Stroke [ ] ; TIA [ ] ; Slurred speech [ ] ;  . Neuro: Headaches[ ] ; Vertigo[ ] ; Seizures[ ] ; Paresthesias[ ] ;Blurred vision [ ] ; Diplopia [ ] ; Vision changes [ ]   . Ortho/Skin: Arthritis [ ] ; Joint pain [ ] ; Muscle pain [ ] ; Joint swelling [ ] ; Back Pain [ ] ; Rash [ ]   . Psych: Depression[ ] ; Anxiety[ ]   . Heme: Bleeding problems [ ] ; Clotting disorders [ ] ; Anemia [ ]   . Endocrine: Diabetes [ ] ; Thyroid dysfunction[ ]   Home Medications Prior to Admission medications   Medication Sig Start Date End Date Taking? Authorizing Provider  aspirin 81 MG tablet Take 81 mg by mouth daily.   Yes [provider]  COMBIVENT RESPIMAT 20-100 MCG/ACT AERS respimat Inhale 2 Inhalers into the lungs as needed. 10/17/15  Yes [provider]  gabapentin (NEURONTIN) 600 MG tablet Take 1 tablet by mouth every 12 (twelve) hours. 09/24/19  Yes [provider]  glimepiride (AMARYL) 2 MG tablet Take 2 mg by mouth daily. Reported on 11/28/2015   Yes [provider]  INVOKAMET XR [id number] MG TB24 Take 2 tablets by mouth daily. 10/06/19  Yes [provider]  lisinopril (PRINIVIL,ZESTRIL) 20 MG tablet Take 20 mg by mouth daily.   Yes [provider]  SYMBICORT 160-4.5 MCG/ACT inhaler Inhale 2 puffs into the lungs 2 (two) times daily.  03/30/19  Yes [provider]  VICTOZA 18 MG/3ML SOPN Inject 1.2 Units into the skin daily. 09/05/15  Yes [provider]    ADVAIR DISKUS 250-50 MCG/DOSE AEPB Inhale 2 puffs into the lungs 2 (two) times daily. 09/14/15   [provider]  albuterol (PROVENTIL) (2.5 MG/3ML) 0.083% nebulizer solution Inhale 1 mL into the lungs 4 (four) times daily as needed. 10/17/15   [provider]  INVOKANA 100 MG TABS tablet Take 100 mg by mouth daily. 10/19/15   [provider]  metFORMIN (GLUCOPHAGE) 500 MG tablet Take 1,000 mg by mouth 2 (two) times daily with a meal.    [provider]    Past Medical History: Past Medical History:  Diagnosis Date  . Arthritis   . COPD (chronic obstructive pulmonary disease) (HCC)   . Essential hypertension   . Type 2 diabetes mellitus (HCC)     Past Surgical History: Past Surgical History:  Procedure Laterality Date  . BACK SURGERY    . FOOT SURGERY    . HAND  SURGERY      Family History: Family History  Problem Relation Age of Onset  . Arthritis Other   . Lung disease Other   . Asthma Other   . Diabetes Other   . Heart disease Sister        Tumor?    Social History: Social History   Socioeconomic History  . Marital status: Married    Spouse name: Not on file  . Number of children: Not on file  . Years of education: 50  . Highest education level: Not on file  Occupational History  . Not on file  Tobacco Use  . Smoking status: Current Every Day Smoker    Packs/day: 1.00    Years: 30.00    Pack years: 30.00    Types: Cigarettes  . Smokeless tobacco: Never Used  Substance and Sexual Activity  . Alcohol use: Yes    Comment: Prior history of excessive intake  . Drug use: No  . Sexual activity: Not on file  Other Topics Concern  . Not on file  Social History Narrative  . Not on file   Social Determinants of Health   Financial Resource Strain:   . Difficulty of Paying Living Expenses: Not on file  Food Insecurity:   . Worried About Charity fundraiser in the Last Year: Not on file  . Ran Out of Food in the Last Year:  Not on file  Transportation Needs:   . Lack of Transportation (Medical): Not on file  . Lack of Transportation (Non-Medical): Not on file  Physical Activity:   . Days of Exercise per Week: Not on file  . Minutes of Exercise per Session: Not on file  Stress:   . Feeling of Stress : Not on file  Social Connections:   . Frequency of Communication with Friends and Family: Not on file  . Frequency of Social Gatherings with Friends and Family: Not on file  . Attends Religious Services: Not on file  . Active Member of Clubs or Organizations: Not on file  . Attends Archivist Meetings: Not on file  . Marital Status: Not on file    Allergies:  No Known Allergies  Objective:    Vital Signs:   Temp:  [97.6 F (36.4 C)-98.1 F (36.7 C)] 98.1 F (36.7 C) (12/15 1105) Pulse Rate:  [25-131] 96 (12/15 1201) Resp:  [3-36] 24 (12/15 1105) BP: (85-132)/(59-105) 113/82 (12/15 1200) SpO2:  [83 %-100 %] 98 % (12/15 1200) Last BM Date: 11/22/19  Weight change: Filed Weights   11/20/19 0919 11/22/19 0500  Weight: 79.4 kg 88.1 kg    Intake/Output:   Intake/Output Summary (Last 24 hours) at 11/23/2019 1411 Last data filed at 11/23/2019 1100 Gross per 24 hour  Intake 737 ml  Output 2225 ml  Net -1488 ml      Physical Exam    General:  Well appearing WM. No resp difficulty HEENT: normal Neck: supple. JVP . Carotids 2+ bilat; no bruits. No lymphadenopathy or thyromegaly appreciated. Cor: PMI nondisplaced. Regular rhythm, tachy rate. No rubs, gallops or murmurs. Lungs: decreased BS at the bases bilaterally but otherwise clear Abdomen: soft, nontender, nondistended. No hepatosplenomegaly. No bruits or masses. Good bowel sounds. Extremities: no cyanosis, clubbing, rash, edema Neuro: alert & orientedx3, cranial nerves grossly intact. moves all 4 extremities w/o difficulty. Affect pleasant   Telemetry   PAF, currently sinus tach low 100s  EKG    Sinus tach w/ PACs and  PVCs  Labs   Basic Metabolic Panel: Recent Labs  Lab 11/20/19 0937 11/21/19 0513 11/22/19 0414 11/23/19 0453  NA 132* 134* 138 136  K 4.5 4.7 3.6 3.9  CL 96* 101 103 103  CO2 24 19* 23 25  GLUCOSE 407* 181* 65* 222*  BUN 16 30* 43* 40*  CREATININE 0.94 1.04 0.90 1.01  CALCIUM 8.6* 8.4* 8.7* 8.3*  MG  --  2.4 2.2 2.2    Liver Function Tests: Recent Labs  Lab 11/20/19 0937  AST 28  ALT 48*  ALKPHOS 92  BILITOT 0.6  PROT 6.2*  ALBUMIN 3.3*   Recent Labs  Lab 11/20/19 0937  LIPASE 30   No results for input(s): AMMONIA in the last 168 hours.  CBC: Recent Labs  Lab 11/20/19 0937 11/21/19 0513 11/22/19 0414 11/23/19 0453  WBC 10.3 14.5* 11.4* 9.6  NEUTROABS 7.2 10.1* 7.6 6.2  HGB 15.4 16.9 15.9 15.6  HCT 49.1 53.8* 48.1 48.6  MCV 90.3 90.3 87.5 88.5  PLT 230 277 268 242    Cardiac Enzymes: No results for input(s): CKTOTAL, CKMB, CKMBINDEX, TROPONINI in the last 168 hours.  BNP: BNP (last 3 results) Recent Labs    11/21/19 0514 11/22/19 0414 11/23/19 0453  BNP 1,285.0* 578.0* 1,054.0*    ProBNP (last 3 results) No results for input(s): PROBNP in the last 8760 hours.   CBG: Recent Labs  Lab 11/22/19 2259 11/23/19 0034 11/23/19 0308 11/23/19 0806 11/23/19 1102  GLUCAP 104* 85 250* 209* 265*    Coagulation Studies: No results for input(s): LABPROT, INR in the last 72 hours.   Imaging    No results found.   Medications:     Current Medications: . Chlorhexidine Gluconate Cloth  6 each Topical Q0600  . digoxin  0.125 mg Oral Daily  . furosemide  40 mg Intravenous Q12H  . insulin aspart  0-15 Units Subcutaneous TID WC  . insulin aspart  0-5 Units Subcutaneous QHS  . insulin glargine  8 Units Subcutaneous Daily  . ipratropium-albuterol  3 mL Inhalation BID  . metoprolol succinate  75 mg Oral Daily  . mometasone-formoterol  2 puff Inhalation BID  . potassium chloride  10 mEq Oral BID  . spironolactone  12.5 mg Oral Daily      Infusions:     Patient Profile   58 y/o male w/ HTN, poorly controlled T2DM, tobacco abuse, COPD and prior h/o ETOH abuse admitted to [facility] with new diagnosis of CHF and Atrial Fibrillation w/ RVR, found to have severe cardiopathy w/ EF <20%. Transferred to [facility] for [facility] and AHF consultation.   Assessment/Plan   1. Acute Systolic Heart Failure: New diagnosis. Presented w/ new exertional dyspnea and bilateral LEE. BNP 1,285.  CXR w/ pulmonary edema and small left pleural effusion. TSH WNL.  - continue to treat w/ IV diuretics + antihypertensives for BP control - Plan RHC to assess filling pressures to help guide further diuresis  - strict I/Os, daily wts, low sodium diet, daily BMPs for monitoring of renal function and lytes  2. Cardiomyopathy: New diagnosis. LVEF <20% with global LV hypokinesis. RV is mildly dilated w/ mildly reduced systolic function. Bi atrial enlargement present (LA moderately  dilated, RA severely dialated), pulmonary artery systolic pressure moderately elevated at 50.3 mmgHg. Mild MR and mild TR.  -Differential diagnosis includes NICM (? Tachy mediated from rapid afib, ETOH induced CM, Hypertensive CM, ? Stressed induced vs Viral (recent treatment for PNA). -Must exclude ischemic etiology given multiple CRFs, (  poorly controlled DM, HTN, tobacco abuse, male sex >14). Endorses recent intermittent substernal chest "tightness". Troponin trend flat 32>>34. - Plan R/LHC to further evaluate. Scheduled today. - Continue GDMT (continue  blocker and MRA + digoxin). Add ACE/ARB/ARNi when BP allows. Would also benefit from addition of a SGLT2i given concomitant DM  -if NICM, plan cMRI   3. Pulmonary HTN: pulmonary artery systolic pressure moderately elevated at 50.3 mmHg by TTE. Suspect components of WHO Group 2 (LHD) + WHO Group 3 (Lung Disease, COPD + untreated OSA) - Continue IV diuretics - Plan RHC to better assess - Continue CPAP for OSA  4. Atrial Fibrillation: new  diagnosis but has been paroxsymal this admit. TSH, K and Mg WNL. He has OSA (new diagnosis) and underlying pulmonary disease (COPD) w/ evidence of bi atrial enlargement on echocardiogram. LA moderately  dilated, RA severely dilated. - continue Toprol XL for rate control + digoxin. No Cardizem w/ LV dysfunction.  - CHA2DS2 VASc score is at least 3 for CHF, HTN and DM. This patients CHA2DS2-VASc Score and unadjusted Ischemic Stroke Rate (% per year) is equal to 3.2 % stroke rate/year from a score of 3.  - Plan to resume Eliquis 5 mg bid once all invasive procedures are complete - given underlying COPD and biatrial enlargement, recurrence may be frequent. May need addition of an AAD and/or possible catheter ablation. LA diameter 4.5 cm.  - Continue CPAP QHS. Will need formal outpatient sleep study and home CPAP once discharged.  - recommend changing PRN albuterol to Xopenex to reduce risk of reflex tachycardia   5. HTN: controlled on current regimen. Continue metoprolol + spiro per above.  - eventual addition of an ACE/ARB. Doubt BP can tolerate Entresto (SBP low 100s).   6. T2DM: poorly controlled. Hgb A1c 12.  - continue insulin per IM - plan eventual addition of a SGLT2i given concomitant systolic HF.  Renal fx/GFR ok  7. OSA: new diagnosis.  Diagnosis made by clinical observation this admit as he has been having apnea episodes up to 50 sec during sleep. He has been started on CPAP.  - will need formal outpatient sleep study and home CPAP device  8. H/o Tobacco Abuse: former smoker. Quit 6 months ago.   Plan R/LHC today. I have reviewed the risks, indications, and alternatives to cardiac catheterization and possible angioplasty/stenting with the patient. Risks include but are not limited to bleeding, infection, vascular injury, stroke, myocardial infection, arrhythmia, kidney injury, radiation-related injury in the case of prolonged fluoroscopy use, emergency cardiac surgery, and death. The  patient understands the risks of serious complication is low (<1%).   Length of Stay: [address], PA-C  11/23/2019, 2:11 PM  Advanced Heart Failure Team Pager [phone] (M-F; 7a - 4p)  Please contact [facility] for night-coverage after hours (4p -7a ) and weekends on [email or web address]  Patient seen with PA, agree with the above note.   He was admitted with CHF and atrial fibrillation/RVR.  Echo showed EF < 20% with mildly decreased RV systolic function.  He also reports exertional chest tightness.  He was diuresed, now back in NSR.  Cath was done today, see below:   RHC/LHC: Coronary Findings  Diagnostic Dominance: Right Left Main  30% distal LM stenosis.  Left Anterior Descending  Heavily calcified proximal and mid LAD. There is 95% stenosis in the proximal LAD just after take-off of a high D1. D1 has moderate diffuse disease.  Left Circumflex  Long 80% mid LCx  stenosis proximal to PLOM.  Right Coronary Artery  RCA with 50% proximal stenosis, 30% mid stenosis. PDA with 75% proximal stenosis.  Intervention  No interventions have been documented. Right Heart  Right Heart Pressures RHC Procedural Findings: Hemodynamics (mmHg) RA mean 13 RV 45/18 PA 53/22, mean 39 PCWP mean 24 LV 106/28 AO 99/66  Oxygen saturations: PA 53% AO 97%  Cardiac Output (Fick) 2.92  Cardiac Index (Fick) 1.42 PVR 5.1 WU PAPI 2.4   General: NAD Neck: JVP 12+ cm, no thyromegaly or thyroid nodule.  Lungs: Clear to auscultation bilaterally with normal respiratory effort. CV: Lateral PMI.  Heart regular S1/S2, no S3/S4, no murmur.  Trace ankle edema.   Abdomen: Soft, nontender, no hepatosplenomegaly, no distention.  Skin: Intact without lesions or rashes.  Neurologic: Alert and oriented x 3.  Psych: Normal affect. Extremities: No clubbing or cyanosis.  HEENT: Normal.   1. Acute systolic CHF:  Echo showed EF < 20% with mildly decreased RV systolic function. He was admitted with  afib/RVR and CHF.  Cath today with elevated filing pressures, low CO (CI 1.4 by RHC), and severe 3 vessel coronary disease.  Ischemic cardiomyopathy, possibly contribution from tachycardia with previous atrial fibrillation.  - I will start him on milrinone 0.25 mcg/kg/min for now.   - Place PICC to follow co-ox and CVP.  - Increase Lasix to 80 mg IV bid.  - Continue digoxin 0.125. - Continue spironolactone 12.5 daily.  - Decrease Toprol XL to 25 mg daily with low output.  2. Atrial fibrillation: With RVR at admission, back in NSR this morning.  - Start heparin gtt post-cath.  - Since I am starting milrinone, I will also begin amiodarone gtt to try to keep him in NSR.  - If has CABG, would recommend Maze as well.  3. CAD: Severe 3VD in diabetic smoker.  He has exertional chest pain at baseline.  Was not admitted with ACS.  Anatomy best-suited to CABG.  - Continue ASA 81 daily.  - Start Crestor 40 mg daily.  - TCTS consult for CABG.  - If no CABG, could approach the LAD stenosis with atherectomy + PCI, other lesions with PCI.   4. 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  Suspect group 2 + group 3 (COPD). Diuresis will be main treatment.  5. Diabetes: Poor control.  Per IM, would eventually benefit from SGLT2 inhibitor.  6. OSA: Strongly suspected.  7. COPD: Suspected, former smoker.  Will need PFTs after diuresis.   Marca Ancona 11/24/2019 10:37 AM

## 2019-11-23 NOTE — Progress Notes (Signed)
Patient alert and oriented x4. No complaints of pain, shortness of breath, chest pain, dizziness, nausea or vomiting. Patient up out of bed with supervision, gait steady. Patient tolerated PO meds and diet well. Appetite good. "Living with Diabetes" Booklet given to patient with patient's wife present. Both expressed understanding. Nurse to Nurse report called and given to [employer] on [place] at [facility]. Patient transferred to [place], room 5 via Care link. Patient stated his wife took his belongings including the living with diabetes booklet home with her.

## 2019-11-23 NOTE — Progress Notes (Signed)
PROGRESS NOTE  CAMPUS   Kevin Underwood  YQM:[id number]  DOB: October 10, 1961  DOA: 11/20/2019 PCP: Monico Blitz, MD   Brief Admission Hx: 58 y.o. male with medical history significant of Hypertension, type 2 Diabetes mellitus, COPD, and arthritis was recently discharged from [facility] last week where he reportedly was treated for pneumonia.  He presented with nonspecific complaints of progressive shortness of breath and swelling in the lower extremities.  He also complained of right upper quadrant abdominal pain.  MDM/Assessment & Plan:   1. Newly discovered cardiomyopathy-not sure if this is related to his chronic alcohol history versus ischemia.  His troponins have been flat.  His cardiac echo revealed EF less than 20%.  The inpatient cardiology service has seen and adjusted his medications.  He is being transferred to [facility] for cath and advanced heart failure consultation.  2. Atrial fibrillation with RVR-his heart rate is better controlled.  Holding apixaban in anticipation of cardiac cath.   3. Paroxysmal atrial fibrillation - confirmed by ECG, he has been started on metoprolol with good results.  Apixaban dosed by pharm D for full anticoagulation.   4. Acute systolic CHF exacerbation-he is responding well to IV Lasix.   Continue Lasix to 40 mg IV every 12 hours plus spironolactone 12.5 mg daily.  Continue to monitor intake and output closely and daily weights.  Monitor blood pressures closely. 5. Poorly controlled type 2 diabetes mellitus with hyperglycemia -as evidenced by hemoglobin A1c of 12.1% his glycemic control is slowly improving with insulin treatment.  He has had some hypoglycemia and his insulin is reduced until he starts eating better.  Continue SSI coverage, CBG testing 5 times per day and begin diabetes education when medically stable. 6. Acute respiratory distress with hypoxia - Resolved now.  Continue supplemental oxygen.   7. Essential hypertension - BP has been  stable, seems to be tolerating medications.   8. Right upper quadrant abdominal pain-abdominal ultrasound obtained and there is no signs of acute cholecystitis he does have cholelithiasis present.  He has thickened walls in the gallbladder. 9. COPD-he does not appear to be in acute exacerbation at this time.  He has bronchodilators ordered.  He received IV solumedrol in ED.  10. OSA-diagnosis made by clinical observation as he was seen to have apnea episodes up to 50 seconds at a time while sleeping.  Auto titrated CPAP ordered to use while in the hospital.  He will need to have an official sleep study completed after he has been medically stabilized and discharged. 11. History of chronic alcoholism-the patient is adamant and reports that he stopped alcohol consumption 6 months ago.  He has been having restlessness and tremulousness since arrival and we are monitoring him closely for alcohol withdrawal.  His EtOH level has been negative. 12. Urinary retention - In/out cath ordered as needed, He seems to be urinating on his own at this time.  13. Restlessness and agitation - seems to be improving,  lorazepam and haldol ordered as needed.  14. Leukocytosis - WBC trending down.  He received a dose of steroids in the ED on 11/20/19.  He does not have any active findings of infection at this time.    DVT prophylaxis: apixaban  Code Status: Full  Family Communication: tried to contact mother but no answer Disposition Plan: transfer to [facility] for cardiac cath and advanced heart failure team consultation    Consultants:  [facility] consult cardiology  Inpatient cardiology starting 12/14  Procedures:  Antimicrobials:     Subjective: Pt says he is pleased that the leg swelling is much better, he feels much less SOB.  He continues to urinate frequently.      Objective: Vitals:   11/23/19 0807 11/23/19 0841 11/23/19 0856 11/23/19 0915  BP:    122/82  Pulse: (!) 110   (!) 104  Resp: (!)  21     Temp: 97.6 F (36.4 C)     TempSrc: Oral     SpO2: 95% 98% 99%   Weight:      Height:        Intake/Output Summary (Last 24 hours) at 11/23/2019 1610 Last data filed at 11/23/2019 9604 Gross per 24 hour  Intake 737 ml  Output 1750 ml  Net -1013 ml   Filed Weights   11/20/19 0919 11/22/19 0500  Weight: 79.4 kg 88.1 kg    REVIEW OF SYSTEMS  As above otherwise all reviewed negative  Exam:  General exam: chronically ill appearing, he appears older than stated age, he is sedated and sleeping, he is arousable.  Respiratory system: bibasilar crackles heard. No increased work of breathing. Cardiovascular system: irregularly irregular, normal S1 & S2 heard. Mild JVD, 1+ pedal edema. Gastrointestinal system: Abdomen is protuberant distended, soft and mild RUQ tenderness. Normal bowel sounds heard. Central nervous system: somnolent. No focal neurological deficits. Extremities: trace pretibial edema RLE, no pretibial edema LLE.  Data Reviewed: Basic Metabolic Panel: Recent Labs  Lab 11/20/19 0937 11/21/19 0513 11/22/19 0414 11/23/19 0453  NA 132* 134* 138 136  K 4.5 4.7 3.6 3.9  CL 96* 101 103 103  CO2 24 19* 23 25  GLUCOSE 407* 181* 65* 222*  BUN 16 30* 43* 40*  CREATININE 0.94 1.04 0.90 1.01  CALCIUM 8.6* 8.4* 8.7* 8.3*  MG  --  2.4 2.2 2.2   Liver Function Tests: Recent Labs  Lab 11/20/19 0937  AST 28  ALT 48*  ALKPHOS 92  BILITOT 0.6  PROT 6.2*  ALBUMIN 3.3*   Recent Labs  Lab 11/20/19 0937  LIPASE 30   No results for input(s): AMMONIA in the last 168 hours. CBC: Recent Labs  Lab 11/20/19 0937 11/21/19 0513 11/22/19 0414 11/23/19 0453  WBC 10.3 14.5* 11.4* 9.6  NEUTROABS 7.2 10.1* 7.6 6.2  HGB 15.4 16.9 15.9 15.6  HCT 49.1 53.8* 48.1 48.6  MCV 90.3 90.3 87.5 88.5  PLT 230 277 268 242   Cardiac Enzymes: No results for input(s): CKTOTAL, CKMB, CKMBINDEX, TROPONINI in the last 168 hours. CBG (last 3)  Recent Labs    11/23/19 0034  11/23/19 0308 11/23/19 0806  GLUCAP 85 250* 209*   Recent Results (from the past 240 hour(s))  SARS CORONAVIRUS 2 (TAT 6-24 HRS) Nasopharyngeal Nasopharyngeal Swab     Status: None   Collection Time: 11/20/19  1:06 PM   Specimen: Nasopharyngeal Swab  Result Value Ref Range Status   SARS Coronavirus 2 NEGATIVE NEGATIVE Final    Comment: (NOTE) SARS-CoV-2 target nucleic acids are NOT DETECTED. The SARS-CoV-2 RNA is generally detectable in upper and lower respiratory specimens during the acute phase of infection. Negative results do not preclude SARS-CoV-2 infection, do not rule out co-infections with other pathogens, and should not be used as the sole basis for treatment or other patient management decisions. Negative results must be combined with clinical observations, patient history, and epidemiological information. The expected result is Negative. Fact Sheet for Patients: HairSlick.no Fact Sheet for Healthcare Providers: quierodirigir.com This test is  not yet approved or cleared by the [place] and  has been authorized for detection and/or diagnosis of SARS-CoV-2 by FDA under an Emergency Use Authorization (EUA). This EUA will remain  in effect (meaning this test can be used) for the duration of the COVID-19 declaration under Section 56 4(b)(1) of the Act, 21 U.S.C. section 360bbb-3(b)(1), unless the authorization is terminated or revoked sooner. Performed at [facility], [address], [place], [place] [address]   MRSA PCR Screening     Status: None   Collection Time: 11/20/19  3:17 PM   Specimen: Nasopharyngeal Swab  Result Value Ref Range Status   MRSA by PCR NEGATIVE NEGATIVE Final    Comment:        The GeneXpert MRSA Assay (FDA approved for NASAL specimens only), is one component of a comprehensive MRSA colonization surveillance program. It is not intended to diagnose MRSA infection nor to  guide or monitor treatment for MRSA infections. Performed at [facility], [address], [place], [place] [address]      Studies: US Abdomen Complete  Result Date: 11/21/2019 CLINICAL DATA:  Right upper quadrant pain. EXAM: ABDOMEN ULTRASOUND COMPLETE COMPARISON:  None. FINDINGS: Gallbladder: Gallbladder wall is thickened to 6-7 mm. Gallbladder mild to moderately distended. No stones. Common bile duct: Diameter: 5 mm Liver: Borderline enlarged. Normal parenchymal echogenicity. No mass or focal lesion. Portal vein is patent on color Doppler imaging with normal direction of blood flow towards the liver. IVC: No abnormality visualized. Pancreas: Obscured by midline bowel gas. Spleen: Size and appearance within normal limits. Right Kidney: Length: 13.3 cm.  There is Left Kidney: Length: 12.3 cm. Echogenicity within normal limits. No mass or hydronephrosis visualized. Abdominal aorta: No aneurysm visualized. Other findings: Ascites seen adjacent to the liver and spleen and gallbladder. Small right pleural effusion. IMPRESSION: 1. No acute findings. 2. Gallbladder wall thickened, but this is nonspecific in the setting of ascites. No gallstones. No findings to suggest acute cholecystitis. 3. Least a small amount of ascites. The liver is borderline enlarged but otherwise normal in appearance. There is no splenomegaly. 4. Pancreas not evaluated, obscured by overlying bowel gas. Electronically Signed   By: Amie Portland M.D.   On: 11/21/2019 12:42   US Venous Img Lower Unilateral Right (DVT)  Result Date: 11/21/2019 CLINICAL DATA:  Right lower extremity swelling. EXAM: Right LOWER EXTREMITY VENOUS DOPPLER ULTRASOUND TECHNIQUE: Gray-scale sonography with graded compression, as well as color Doppler and duplex ultrasound were performed to evaluate the lower extremity deep venous systems from the level of the common femoral vein and including the common femoral, femoral, profunda femoral, popliteal and calf  veins including the posterior tibial, peroneal and gastrocnemius veins when visible. The superficial great saphenous vein was also interrogated. Spectral Doppler was utilized to evaluate flow at rest and with distal augmentation maneuvers in the common femoral, femoral and popliteal veins. COMPARISON:  None. FINDINGS: Contralateral Common Femoral Vein: Respiratory phasicity is normal and symmetric with the symptomatic side. No evidence of thrombus. Normal compressibility. Common Femoral Vein: No evidence of thrombus. Normal compressibility, respiratory phasicity and response to augmentation. Saphenofemoral Junction: No evidence of thrombus. Normal compressibility and flow on color Doppler imaging. Profunda Femoral Vein: No evidence of thrombus. Normal compressibility and flow on color Doppler imaging. Femoral Vein: No evidence of thrombus. Normal compressibility, respiratory phasicity and response to augmentation. Popliteal Vein: No evidence of thrombus. Normal compressibility, respiratory phasicity and response to augmentation. Calf Veins: No evidence of thrombus.  Normal compressibility and flow on color Doppler imaging. Superficial Great Saphenous Vein: No evidence of thrombus. Normal compressibility. Venous Reflux:  None. Other Findings:  None. IMPRESSION: No definite evidence of deep venous thrombosis seen in right lower extremity. Electronically Signed   By: Lupita RaiderJames  Green Jr M.D.   On: 11/21/2019 12:44   ECHOCARDIOGRAM COMPLETE  Result Date: 11/21/2019   ECHOCARDIOGRAM REPORT   Patient Name:   Kevin Underwood Date of Exam: 11/21/2019 Medical Rec #:  [id number]        Height:       69.0 in Accession #:    [id number]       Weight:       175.0 lb Date of Birth:  [birth year]        BSA:          1.95 m Patient Age:    58 years         BP:           104/87 mmHg Patient Gender: M                HR:           91 bpm. [facility] Procedure: 2D Echo Indications:    Chest Pain 786.50 / R07.9  History:         Patient has no prior history of Echocardiogram examinations.                 CHF, COPD, Arrythmias:Atrial Fibrillation; Risk                 Factors:Diabetes, Current Smoker and Hypertension.  Sonographer:    Jeryl ColumbiaJohanna Elliott RDCS (AE) Referring Phys: [id number] Dennie Moltz L Tekisha Darcey IMPRESSIONS  1. Left ventricular ejection fraction, by visual estimation, is <20%. The left ventricle has severely decreased function. There is no left ventricular hypertrophy.  2. Left ventricular diastolic parameters are indeterminate.  3. The left ventricle demonstrates global hypokinesis.  4. Global right ventricle has mildly reduced systolic function.The right ventricular size is mildly enlarged. No increase in right ventricular wall thickness.  5. Left atrial size was moderately dilated.  6. Right atrial size was severely dilated.  7. The mitral valve is abnormal. Mild mitral valve regurgitation.  8. The tricuspid valve is grossly normal. Tricuspid valve regurgitation is mild.  9. The aortic valve is tricuspid. Aortic valve regurgitation is not visualized. 10. The pulmonic valve was grossly normal. Pulmonic valve regurgitation is trivial. 11. Moderately elevated pulmonary artery systolic pressure. 12. The inferior vena cava is dilated in size with <50% respiratory variability, suggesting right atrial pressure of 15 mmHg. FINDINGS  Left Ventricle: Left ventricular ejection fraction, by visual estimation, is <20%. The left ventricle has severely decreased function. The left ventricle demonstrates global hypokinesis. There is no left ventricular hypertrophy. Left ventricular diastolic parameters are indeterminate. Right Ventricle: The right ventricular size is mildly enlarged. No increase in right ventricular wall thickness. Global RV systolic function is has mildly reduced systolic function. The tricuspid regurgitant velocity is 2.97 m/s, and with an assumed right atrial pressure of 15 mmHg, the estimated right ventricular systolic  pressure is moderately elevated at 50.3 mmHg. Left Atrium: Left atrial size was moderately dilated. Right Atrium: Right atrial size was severely dilated Pericardium: There is no evidence of pericardial effusion. Mitral Valve: The mitral valve is abnormal. There is mild thickening of the mitral valve leaflet(s). Mild mitral valve regurgitation. Tricuspid Valve: The tricuspid valve is grossly normal. Tricuspid valve  regurgitation is mild. Aortic Valve: The aortic valve is tricuspid. Aortic valve regurgitation is not visualized. Pulmonic Valve: The pulmonic valve was grossly normal. Pulmonic valve regurgitation is trivial. Pulmonic regurgitation is trivial. Aorta: The aortic root and ascending aorta are structurally normal, with no evidence of dilitation. Venous: The inferior vena cava is dilated in size with less than 50% respiratory variability, suggesting right atrial pressure of 15 mmHg. IAS/Shunts: No atrial level shunt detected by color flow Doppler.  LEFT VENTRICLE PLAX 2D LVIDd:         5.18 cm  Diastology LVIDs:         4.54 cm  LV e' lateral: 5.57 cm/s LV PW:         1.46 cm  LV e' medial:  3.23 cm/s LV IVS:        1.05 cm LVOT diam:     2.00 cm LV SV:         34 ml LV SV Index:   17.18 LVOT Area:     3.14 cm  RIGHT VENTRICLE RV S prime:     9.96 cm/s TAPSE (M-mode): 1.1 cm LEFT ATRIUM              Index       RIGHT ATRIUM           Index LA diam:        4.50 cm  2.31 cm/m  RA Area:     25.00 cm LA Vol (A2C):   103.0 ml 52.77 ml/m RA Volume:   95.20 ml  48.77 ml/m LA Vol (A4C):   76.4 ml  39.14 ml/m LA Biplane Vol: 92.2 ml  47.23 ml/m   AORTA Ao Root diam: 3.50 cm MR Peak grad: 71.9 mmHg   TRICUSPID VALVE MR Mean grad: 47.0 mmHg   TR Peak grad:   35.3 mmHg MR Vmax:      424.00 cm/s TR Vmax:        297.00 cm/s MR Vmean:     321.0 cm/s                           SHUNTS                           Systemic Diam: 2.00 cm  Zoila ShutterKenneth Hilty MD Electronically signed by Zoila ShutterKenneth Hilty MD Signature Date/Time:  11/21/2019/11:01:15 AM    Final    Scheduled Meds: . Chlorhexidine Gluconate Cloth  6 each Topical Q0600  . dextrose      . digoxin  0.125 mg Oral Daily  . furosemide  40 mg Intravenous Q12H  . insulin aspart  0-15 Units Subcutaneous TID WC  . insulin aspart  0-5 Units Subcutaneous QHS  . insulin glargine  8 Units Subcutaneous Daily  . ipratropium-albuterol  3 mL Inhalation BID  . metoprolol succinate  75 mg Oral Daily  . mometasone-formoterol  2 puff Inhalation BID  . potassium chloride  10 mEq Oral BID  . spironolactone  12.5 mg Oral Daily   Continuous Infusions:  Principal Problem:   Atrial fibrillation with RVR (HCC) Active Problems:   Acute congestive heart failure (HCC)   Acute respiratory distress   COPD (chronic obstructive pulmonary disease) (HCC)   Hypertension   Type 2 diabetes mellitus (HCC)   Cardiomyopathy (HCC)   Acute systolic CHF (congestive heart failure) (HCC)   History of alcohol abuse Quit 6 mos ago  History of recreational drug use   PAF (paroxysmal atrial fibrillation) (HCC)   Cardiac volume overload   Elevated brain natriuretic peptide (BNP) level   Leg edema, right   Hyponatremia   Abnormal electrocardiogram (ECG) (EKG)   Hyperglycemia   Tremulousness   Restlessness and agitation   OSA (obstructive sleep apnea)   RUQ abdominal pain  Critical Care Procedure Note Authorized and Performed by: Maryln Manuel MD  Total Critical Care time:  30 minutes  Due to a high probability of clinically significant, life threatening deterioration, the patient required my highest level of preparedness to intervene emergently and I personally spent this critical care time directly and personally managing the patient.  This critical care time included obtaining a history; examining the patient, pulse oximetry; ordering and review of studies; arranging urgent treatment with development of a management plan; evaluation of patient's response of treatment; frequent  reassessment; and discussions with other providers.  This critical care time was performed to assess and manage the high probability of imminent and life threatening deterioration that could result in multi-organ failure.  It was exclusive of separately billable procedures and treating other patients and teaching time.   Standley Dakins, MD [facility] 11/23/2019, 9:24 AM    LOS: 3 days  How to contact the [facility] Attending or Consulting provider 7A - 7P or covering provider during after hours 7P -7A, for this patient?  1. Check the care team in [facility] and look for a) attending/consulting [facility] provider listed and b) the [facility] team listed 2. Log into [email or web address] and use [place]'s universal password to access. If you do not have the password, please contact the hospital operator. 3. Locate the [facility] provider you are looking for under [facility] and page to a number that you can be directly reached. 4. If you still have difficulty reaching the provider, please page the [facility] (Director on Call) for the Hospitalists listed on amion for assistance.

## 2019-11-23 NOTE — Progress Notes (Addendum)
RN paged NP because pt fell in his room when trying to go to the bathroom alone. Per pt, he hit his forehead on the wall in the corner of the room. States he has a mild HA. No dizziness, nausea or visual changes. Small abrasion on nose. No bleeding. No open wound. Pt is on Eliquis.  Pt received Ativan about 2 hours prior to fall. D/c'd Ativan. Stat CT head ordered. Abrasion requires no treatment.  Neuro checks q 4 x 24 hours.  KJKG, NP [facility] Update: CT head neg for acute change. No fracture.  KJKG, NP [facility]

## 2019-11-24 ENCOUNTER — Inpatient Hospital Stay (HOSPITAL_COMMUNITY): Payer: 59 | Admitting: Certified Registered Nurse Anesthetist

## 2019-11-24 ENCOUNTER — Inpatient Hospital Stay (HOSPITAL_COMMUNITY): Payer: 59

## 2019-11-24 ENCOUNTER — Other Ambulatory Visit: Payer: Self-pay | Admitting: *Deleted

## 2019-11-24 ENCOUNTER — Encounter (HOSPITAL_COMMUNITY)
Admission: EM | Disposition: A | Discharge status: Home / Self Care | Payer: Self-pay | Source: Home / Self Care | Attending: Cardiothoracic Surgery

## 2019-11-24 ENCOUNTER — Inpatient Hospital Stay: Payer: Self-pay

## 2019-11-24 DIAGNOSIS — I472 Ventricular tachycardia: Secondary | ICD-10-CM

## 2019-11-24 DIAGNOSIS — I509 Heart failure, unspecified: Secondary | ICD-10-CM

## 2019-11-24 DIAGNOSIS — I4819 Other persistent atrial fibrillation: Secondary | ICD-10-CM

## 2019-11-24 DIAGNOSIS — R570 Cardiogenic shock: Secondary | ICD-10-CM

## 2019-11-24 DIAGNOSIS — I2510 Atherosclerotic heart disease of native coronary artery without angina pectoris: Secondary | ICD-10-CM

## 2019-11-24 DIAGNOSIS — I4891 Unspecified atrial fibrillation: Secondary | ICD-10-CM

## 2019-11-24 DIAGNOSIS — J9600 Acute respiratory failure, unspecified whether with hypoxia or hypercapnia: Secondary | ICD-10-CM

## 2019-11-24 DIAGNOSIS — I255 Ischemic cardiomyopathy: Secondary | ICD-10-CM

## 2019-11-24 HISTORY — PX: RIGHT/LEFT HEART CATH AND CORONARY ANGIOGRAPHY: CATH118266

## 2019-11-24 HISTORY — PX: TEE WITHOUT CARDIOVERSION: SHX5443

## 2019-11-24 HISTORY — PX: PLACEMENT OF IMPELLA LEFT VENTRICULAR ASSIST DEVICE: SHX6519

## 2019-11-24 LAB — BLOOD GAS, ARTERIAL
Acid-base deficit: 2.3 mmol/L — ABNORMAL HIGH (ref 0.0–2.0)
Bicarbonate: 20.8 mmol/L (ref 20.0–28.0)
Drawn by: 270221
FIO2: 32
O2 Saturation: 98.3 %
Patient temperature: 36.3
pCO2 arterial: 27.7 mmHg — ABNORMAL LOW (ref 32.0–48.0)
pH, Arterial: 7.484 — ABNORMAL HIGH (ref 7.350–7.450)
pO2, Arterial: 111 mmHg — ABNORMAL HIGH (ref 83.0–108.0)

## 2019-11-24 LAB — BASIC METABOLIC PANEL
Anion gap: 11 (ref 5–15)
BUN: 33 mg/dL — ABNORMAL HIGH (ref 6–20)
CO2: 22 mmol/L (ref 22–32)
Calcium: 8.5 mg/dL — ABNORMAL LOW (ref 8.9–10.3)
Chloride: 104 mmol/L (ref 98–111)
Creatinine, Ser: 1.11 mg/dL (ref 0.61–1.24)
GFR calc Af Amer: >60 mL/min (ref >60)
GFR calc non Af Amer: >60 mL/min (ref >60)
Glucose, Bld: 181 mg/dL — ABNORMAL HIGH (ref 70–99)
Potassium: 4.2 mmol/L (ref 3.5–5.1)
Sodium: 137 mmol/L (ref 135–145)

## 2019-11-24 LAB — POCT I-STAT EG7
Acid-Base Excess: 2 mmol/L (ref 0.0–2.0)
Acid-Base Excess: 3 mmol/L — ABNORMAL HIGH (ref 0.0–2.0)
Bicarbonate: 28 mmol/L (ref 20.0–28.0)
Bicarbonate: 28.2 mmol/L — ABNORMAL HIGH (ref 20.0–28.0)
Calcium, Ion: 1.17 mmol/L (ref 1.15–1.40)
Calcium, Ion: 1.21 mmol/L (ref 1.15–1.40)
HCT: 47 % (ref 39.0–52.0)
HCT: 48 % (ref 39.0–52.0)
Hemoglobin: 16 g/dL (ref 13.0–17.0)
Hemoglobin: 16.3 g/dL (ref 13.0–17.0)
O2 Saturation: 52 %
O2 Saturation: 54 %
Potassium: 3.5 mmol/L (ref 3.5–5.1)
Potassium: 3.6 mmol/L (ref 3.5–5.1)
Sodium: 140 mmol/L (ref 135–145)
Sodium: 141 mmol/L (ref 135–145)
TCO2: 29 mmol/L (ref 22–32)
TCO2: 30 mmol/L (ref 22–32)
pCO2, Ven: 45.8 mmHg (ref 44.0–60.0)
pCO2, Ven: 46.2 mmHg (ref 44.0–60.0)
pH, Ven: 7.39 (ref 7.250–7.430)
pH, Ven: 7.399 (ref 7.250–7.430)
pO2, Ven: 28 mmHg — CL (ref 32.0–45.0)
pO2, Ven: 29 mmHg — CL (ref 32.0–45.0)

## 2019-11-24 LAB — CBC
HCT: 44.5 % (ref 39.0–52.0)
Hemoglobin: 14.6 g/dL (ref 13.0–17.0)
MCH: 28.9 pg (ref 26.0–34.0)
MCHC: 32.8 g/dL (ref 30.0–36.0)
MCV: 88.1 fL (ref 80.0–100.0)
Platelets: 233 10*3/uL (ref 150–400)
RBC: 5.05 MIL/uL (ref 4.22–5.81)
RDW: 14.4 % (ref 11.5–15.5)
WBC: 9.8 10*3/uL (ref 4.0–10.5)
nRBC: 0 % (ref 0.0–0.2)

## 2019-11-24 LAB — POCT I-STAT 7, (LYTES, BLD GAS, ICA,H+H)
Acid-Base Excess: 30 mmol/L — ABNORMAL HIGH (ref 0.0–2.0)
Acid-base deficit: 6 mmol/L — ABNORMAL HIGH (ref 0.0–2.0)
Bicarbonate: 20.1 mmol/L (ref 20.0–28.0)
Bicarbonate: 56.9 mmol/L — ABNORMAL HIGH (ref 20.0–28.0)
Calcium, Ion: 1.09 mmol/L — ABNORMAL LOW (ref 1.15–1.40)
Calcium, Ion: 0.79 mmol/L — CL (ref 1.15–1.40)
HCT: 48 % (ref 39.0–52.0)
HCT: 39 % (ref 39.0–52.0)
Hemoglobin: 16.3 g/dL (ref 13.0–17.0)
Hemoglobin: 13.3 g/dL (ref 13.0–17.0)
O2 Saturation: 100 %
O2 Saturation: 99 %
Potassium: 4 mmol/L (ref 3.5–5.1)
Potassium: 3.2 mmol/L — ABNORMAL LOW (ref 3.5–5.1)
Sodium: 136 mmol/L (ref 135–145)
Sodium: 153 mmol/L — ABNORMAL HIGH (ref 135–145)
TCO2: 21 mmol/L — ABNORMAL LOW (ref 22–32)
TCO2: >50 mmol/L — ABNORMAL HIGH (ref 22–32)
pCO2 arterial: 42.6 mmHg (ref 32.0–48.0)
pCO2 arterial: 63.6 mmHg — ABNORMAL HIGH (ref 32.0–48.0)
pH, Arterial: 7.282 — ABNORMAL LOW (ref 7.350–7.450)
pH, Arterial: 7.559 — ABNORMAL HIGH (ref 7.350–7.450)
pO2, Arterial: 314 mmHg — ABNORMAL HIGH (ref 83.0–108.0)
pO2, Arterial: 151 mmHg — ABNORMAL HIGH (ref 83.0–108.0)

## 2019-11-24 LAB — COMPREHENSIVE METABOLIC PANEL
ALT: 111 U/L — ABNORMAL HIGH (ref 0–44)
AST: 76 U/L — ABNORMAL HIGH (ref 15–41)
Albumin: 2.6 g/dL — ABNORMAL LOW (ref 3.5–5.0)
Alkaline Phosphatase: 92 U/L (ref 38–126)
Anion gap: 8 (ref 5–15)
BUN: 29 mg/dL — ABNORMAL HIGH (ref 6–20)
CO2: 24 mmol/L (ref 22–32)
Calcium: 8.1 mg/dL — ABNORMAL LOW (ref 8.9–10.3)
Chloride: 104 mmol/L (ref 98–111)
Creatinine, Ser: 1.04 mg/dL (ref 0.61–1.24)
GFR calc Af Amer: >60 mL/min (ref >60)
GFR calc non Af Amer: >60 mL/min (ref >60)
Glucose, Bld: 278 mg/dL — ABNORMAL HIGH (ref 70–99)
Potassium: 3.8 mmol/L (ref 3.5–5.1)
Sodium: 136 mmol/L (ref 135–145)
Total Bilirubin: 0.5 mg/dL (ref 0.3–1.2)
Total Protein: 4.6 g/dL — ABNORMAL LOW (ref 6.5–8.1)

## 2019-11-24 LAB — COOXEMETRY PANEL
Carboxyhemoglobin: 1.7 % — ABNORMAL HIGH (ref 0.5–1.5)
Methemoglobin: 0.9 % (ref 0.0–1.5)
O2 Saturation: 76.3 %
Total hemoglobin: 14.5 g/dL (ref 12.0–16.0)

## 2019-11-24 LAB — GLUCOSE, CAPILLARY
Glucose-Capillary: 173 mg/dL — ABNORMAL HIGH (ref 70–99)
Glucose-Capillary: 152 mg/dL — ABNORMAL HIGH (ref 70–99)
Glucose-Capillary: 202 mg/dL — ABNORMAL HIGH (ref 70–99)
Glucose-Capillary: 251 mg/dL — ABNORMAL HIGH (ref 70–99)

## 2019-11-24 LAB — LACTIC ACID, PLASMA: Lactic Acid, Venous: 2 mmol/L (ref 0.5–1.9)

## 2019-11-24 LAB — APTT: aPTT: 147 seconds — ABNORMAL HIGH (ref 24–36)

## 2019-11-24 LAB — POCT ACTIVATED CLOTTING TIME
Activated Clotting Time: 207 seconds
Activated Clotting Time: 237 seconds

## 2019-11-24 LAB — PREPARE RBC (CROSSMATCH)

## 2019-11-24 LAB — ABO/RH: ABO/RH(D): B POS

## 2019-11-24 LAB — HEPARIN LEVEL (UNFRACTIONATED): Heparin Unfractionated: 0.64 IU/mL (ref 0.30–0.70)

## 2019-11-24 LAB — TRIGLYCERIDES: Triglycerides: 40 mg/dL (ref <150)

## 2019-11-24 LAB — MAGNESIUM: Magnesium: 1.7 mg/dL (ref 1.7–2.4)

## 2019-11-24 LAB — LACTATE DEHYDROGENASE: LDH: 232 U/L — ABNORMAL HIGH (ref 98–192)

## 2019-11-24 SURGERY — INSERTION, CARDIAC ASSIST DEVICE, IMPELLA
Anesthesia: General

## 2019-11-24 SURGERY — RIGHT/LEFT HEART CATH AND CORONARY ANGIOGRAPHY
Anesthesia: LOCAL

## 2019-11-24 MED ORDER — MIDAZOLAM HCL 2 MG/2ML IJ SOLN
INTRAMUSCULAR | Status: AC
  Filled 2019-11-24: qty 2

## 2019-11-24 MED ORDER — AMIODARONE LOAD VIA INFUSION
150.0000 mg | Freq: Once | INTRAVENOUS | Status: AC
  Administered 2019-11-24: 150 mg via INTRAVENOUS
  Filled 2019-11-24: qty 83.34

## 2019-11-24 MED ORDER — INSULIN REGULAR(HUMAN) IN NACL 100-0.9 UT/100ML-% IV SOLN
INTRAVENOUS | Status: DC
  Filled 2019-11-24: qty 100

## 2019-11-24 MED ORDER — VANCOMYCIN HCL 10 G IV SOLR
1500.0000 mg | INTRAVENOUS | Status: DC
  Filled 2019-11-24: qty 1500

## 2019-11-24 MED ORDER — HEPARIN SODIUM (PORCINE) 5000 UNIT/ML IJ SOLN
50000.0000 [IU] | INTRAVENOUS | Status: DC
  Administered 2019-11-24: 50000 [IU]
  Filled 2019-11-24: qty 10

## 2019-11-24 MED ORDER — SODIUM CHLORIDE 0.9 % IV SOLN
750.0000 mg | INTRAVENOUS | Status: DC
  Filled 2019-11-24: qty 750

## 2019-11-24 MED ORDER — SODIUM CHLORIDE 0.9% FLUSH
3.0000 mL | Freq: Two times a day (BID) | INTRAVENOUS | Status: DC
  Administered 2019-11-24 – 2019-11-25 (×4): 3 mL via INTRAVENOUS

## 2019-11-24 MED ORDER — SODIUM CHLORIDE 0.9% FLUSH: 3.0000 mL | INTRAVENOUS | Status: DC | PRN

## 2019-11-24 MED ORDER — SODIUM CHLORIDE 0.9 % IV SOLN: INTRAVENOUS | Status: DC

## 2019-11-24 MED ORDER — FENTANYL CITRATE (PF) 100 MCG/2ML IJ SOLN
INTRAMUSCULAR | Status: AC
  Filled 2019-11-24: qty 2

## 2019-11-24 MED ORDER — TRANEXAMIC ACID 1000 MG/10ML IV SOLN
1.5000 mg/kg/h | INTRAVENOUS | Status: DC
  Filled 2019-11-24: qty 25

## 2019-11-24 MED ORDER — NITROGLYCERIN IN D5W 200-5 MCG/ML-% IV SOLN
2.0000 ug/min | INTRAVENOUS | Status: DC
  Filled 2019-11-24: qty 250

## 2019-11-24 MED ORDER — ASPIRIN 81 MG PO CHEW
81.0000 mg | CHEWABLE_TABLET | ORAL | Status: AC
  Administered 2019-11-24: 81 mg via ORAL
  Filled 2019-11-24: qty 1

## 2019-11-24 MED ORDER — PHENYLEPHRINE HCL-NACL 20-0.9 MG/250ML-% IV SOLN
30.0000 ug/min | INTRAVENOUS | Status: DC
  Filled 2019-11-24: qty 250

## 2019-11-24 MED ORDER — SODIUM CHLORIDE 0.9 % IV SOLN: 250.0000 mL | INTRAVENOUS | Status: DC | PRN

## 2019-11-24 MED ORDER — GABAPENTIN 300 MG PO CAPS: 600.0000 mg | ORAL_CAPSULE | Freq: Two times a day (BID) | ORAL | Status: DC

## 2019-11-24 MED ORDER — LIDOCAINE HCL (PF) 1 % IJ SOLN
INTRAMUSCULAR | Status: AC
  Filled 2019-11-24: qty 30

## 2019-11-24 MED ORDER — HEMOSTATIC AGENTS (NO CHARGE) OPTIME
TOPICAL | Status: DC | PRN
  Administered 2019-11-24: 1 via TOPICAL

## 2019-11-24 MED ORDER — NOREPINEPHRINE 16 MG/250ML-% IV SOLN
0.0000 ug/min | INTRAVENOUS | Status: DC
  Administered 2019-11-24: 20:00:00 10 ug/min via INTRAVENOUS
  Filled 2019-11-24: qty 250

## 2019-11-24 MED ORDER — ALBUMIN HUMAN 5 % IV SOLN
INTRAVENOUS | Status: AC
  Filled 2019-11-24: qty 500

## 2019-11-24 MED ORDER — MILRINONE LACTATE IN DEXTROSE 20-5 MG/100ML-% IV SOLN
0.3000 ug/kg/min | INTRAVENOUS | Status: DC
  Filled 2019-11-24: qty 100

## 2019-11-24 MED ORDER — FENTANYL CITRATE (PF) 100 MCG/2ML IJ SOLN
INTRAMUSCULAR | Status: DC | PRN
  Administered 2019-11-24: 25 ug via INTRAVENOUS

## 2019-11-24 MED ORDER — VERAPAMIL HCL 2.5 MG/ML IV SOLN
INTRAVENOUS | Status: DC | PRN
  Administered 2019-11-24: 10 mL via INTRA_ARTERIAL

## 2019-11-24 MED ORDER — SODIUM BICARBONATE 8.4 % IV SOLN
INTRAVENOUS | Status: DC | PRN
  Administered 2019-11-24: 50 meq via INTRAVENOUS

## 2019-11-24 MED ORDER — CALCIUM CHLORIDE 10 % IV SOLN
INTRAVENOUS | Status: DC | PRN
  Administered 2019-11-24: 500 mg via INTRAVENOUS

## 2019-11-24 MED ORDER — VERAPAMIL HCL 2.5 MG/ML IV SOLN
INTRAVENOUS | Status: AC
  Filled 2019-11-24: qty 2

## 2019-11-24 MED ORDER — ASPIRIN EC 81 MG PO TBEC
81.0000 mg | DELAYED_RELEASE_TABLET | Freq: Every day | ORAL | Status: DC
  Administered 2019-11-25: 81 mg via ORAL
  Filled 2019-11-24: qty 1

## 2019-11-24 MED ORDER — SODIUM CHLORIDE 0.9 % IV SOLN
1.5000 g | INTRAVENOUS | Status: DC
  Filled 2019-11-24: qty 1.5

## 2019-11-24 MED ORDER — SODIUM CHLORIDE 0.9 % IV SOLN
INTRAVENOUS | Status: AC
  Filled 2019-11-24: qty 1.2

## 2019-11-24 MED ORDER — HYDRALAZINE HCL 20 MG/ML IJ SOLN: 10.0000 mg | INTRAMUSCULAR | Status: AC | PRN

## 2019-11-24 MED ORDER — MAGNESIUM SULFATE 2 GM/50ML IV SOLN
2.0000 g | Freq: Once | INTRAVENOUS | Status: AC
  Administered 2019-11-25: 2 g via INTRAVENOUS
  Filled 2019-11-24: qty 50

## 2019-11-24 MED ORDER — ASPIRIN 81 MG PO CHEW: 81.0000 mg | CHEWABLE_TABLET | ORAL | Status: DC

## 2019-11-24 MED ORDER — FUROSEMIDE 10 MG/ML IJ SOLN
40.0000 mg | Freq: Once | INTRAMUSCULAR | Status: AC
  Administered 2019-11-24: 40 mg via INTRAVENOUS
  Filled 2019-11-24: qty 4

## 2019-11-24 MED ORDER — SUCCINYLCHOLINE CHLORIDE 200 MG/10ML IV SOSY
PREFILLED_SYRINGE | INTRAVENOUS | Status: DC | PRN
  Administered 2019-11-24: 140 mg via INTRAVENOUS

## 2019-11-24 MED ORDER — ONDANSETRON HCL 4 MG/2ML IJ SOLN
INTRAMUSCULAR | Status: DC | PRN
  Administered 2019-11-24: 4 mg via INTRAVENOUS

## 2019-11-24 MED ORDER — FENTANYL CITRATE (PF) 250 MCG/5ML IJ SOLN
INTRAMUSCULAR | Status: DC | PRN
  Administered 2019-11-24: 100 ug via INTRAVENOUS

## 2019-11-24 MED ORDER — VASOPRESSIN 20 UNIT/ML IV SOLN
INTRAVENOUS | Status: AC
  Filled 2019-11-24: qty 1

## 2019-11-24 MED ORDER — FENTANYL CITRATE (PF) 100 MCG/2ML IJ SOLN
50.0000 ug | Freq: Once | INTRAMUSCULAR | Status: AC
  Administered 2019-11-24: 21:00:00 50 ug via INTRAVENOUS

## 2019-11-24 MED ORDER — DEXMEDETOMIDINE HCL IN NACL 200 MCG/50ML IV SOLN
INTRAVENOUS | Status: DC | PRN
  Administered 2019-11-24: .4 ug/kg/h via INTRAVENOUS

## 2019-11-24 MED ORDER — ROCURONIUM BROMIDE 10 MG/ML (PF) SYRINGE
PREFILLED_SYRINGE | INTRAVENOUS | Status: DC | PRN
  Administered 2019-11-24: 50 mg via INTRAVENOUS
  Administered 2019-11-24: 60 mg via INTRAVENOUS

## 2019-11-24 MED ORDER — 0.9 % SODIUM CHLORIDE (POUR BTL) OPTIME
TOPICAL | Status: DC | PRN
  Administered 2019-11-24: 4000 mL

## 2019-11-24 MED ORDER — SODIUM CHLORIDE 0.9 % IV SOLN
INTRAVENOUS | Status: DC
  Filled 2019-11-24: qty 30

## 2019-11-24 MED ORDER — MAGNESIUM SULFATE 50 % IJ SOLN
40.0000 meq | INTRAMUSCULAR | Status: DC
  Filled 2019-11-24: qty 9.85

## 2019-11-24 MED ORDER — MIDAZOLAM HCL 2 MG/2ML IJ SOLN
2.0000 mg | Freq: Once | INTRAMUSCULAR | Status: AC
  Administered 2019-11-24: 2 mg via INTRAVENOUS

## 2019-11-24 MED ORDER — GABAPENTIN 300 MG PO CAPS
600.0000 mg | ORAL_CAPSULE | Freq: Two times a day (BID) | ORAL | Status: DC
  Administered 2019-11-24 – 2019-11-25 (×4): 600 mg via ORAL
  Filled 2019-11-24 (×4): qty 2

## 2019-11-24 MED ORDER — ENOXAPARIN SODIUM 40 MG/0.4ML ~~LOC~~ SOLN: 40.0000 mg | SUBCUTANEOUS | Status: DC

## 2019-11-24 MED ORDER — AMIODARONE HCL IN DEXTROSE 360-4.14 MG/200ML-% IV SOLN
30.0000 mg/h | INTRAVENOUS | Status: DC
  Administered 2019-11-24: 30 mg/h via INTRAVENOUS
  Administered 2019-11-24: 60 mg/h via INTRAVENOUS
  Administered 2019-11-25 – 2019-12-01 (×13): 30 mg/h via INTRAVENOUS
  Filled 2019-11-24 (×17): qty 200

## 2019-11-24 MED ORDER — HEPARIN SODIUM (PORCINE) 1000 UNIT/ML IJ SOLN
INTRAMUSCULAR | Status: DC | PRN
  Administered 2019-11-24: 4000 [IU] via INTRAVENOUS

## 2019-11-24 MED ORDER — FENTANYL CITRATE (PF) 250 MCG/5ML IJ SOLN
INTRAMUSCULAR | Status: AC
  Filled 2019-11-24: qty 5

## 2019-11-24 MED ORDER — SODIUM CHLORIDE 0.9% IV SOLUTION: Freq: Once | INTRAVENOUS | Status: DC

## 2019-11-24 MED ORDER — ALBUMIN HUMAN 5 % IV SOLN
12.5000 g | Freq: Once | INTRAVENOUS | Status: AC
  Administered 2019-11-24: 12.5 g via INTRAVENOUS

## 2019-11-24 MED ORDER — VANCOMYCIN HCL 1000 MG IV SOLR
INTRAVENOUS | Status: DC
  Filled 2019-11-24: qty 1000

## 2019-11-24 MED ORDER — HEPARIN (PORCINE) IN NACL 1000-0.9 UT/500ML-% IV SOLN
INTRAVENOUS | Status: DC | PRN
  Administered 2019-11-24 (×3): 500 mL

## 2019-11-24 MED ORDER — LORAZEPAM 2 MG/ML IJ SOLN: 2.0000 mg | INTRAMUSCULAR | Status: DC

## 2019-11-24 MED ORDER — ONDANSETRON HCL 4 MG/2ML IJ SOLN: 4.0000 mg | Freq: Four times a day (QID) | INTRAMUSCULAR | Status: DC | PRN

## 2019-11-24 MED ORDER — PROPOFOL 1000 MG/100ML IV EMUL
INTRAVENOUS | Status: AC
  Filled 2019-11-24: qty 100

## 2019-11-24 MED ORDER — FUROSEMIDE 10 MG/ML IJ SOLN
12.0000 mg/h | INTRAVENOUS | Status: DC
  Filled 2019-11-24: qty 25

## 2019-11-24 MED ORDER — HEPARIN SODIUM (PORCINE) 1000 UNIT/ML IJ SOLN
INTRAMUSCULAR | Status: AC
  Filled 2019-11-24: qty 1

## 2019-11-24 MED ORDER — LIDOCAINE HCL (PF) 1 % IJ SOLN
INTRAMUSCULAR | Status: DC | PRN
  Administered 2019-11-24 (×2): 2 mL

## 2019-11-24 MED ORDER — MILRINONE LACTATE IN DEXTROSE 20-5 MG/100ML-% IV SOLN
0.2500 ug/kg/min | INTRAVENOUS | Status: DC
  Administered 2019-11-24: 0.25 ug/kg/min via INTRAVENOUS
  Filled 2019-11-24: qty 100

## 2019-11-24 MED ORDER — POTASSIUM CHLORIDE 2 MEQ/ML IV SOLN
80.0000 meq | INTRAVENOUS | Status: DC
  Filled 2019-11-24: qty 40

## 2019-11-24 MED ORDER — MIDAZOLAM HCL 2 MG/2ML IJ SOLN
INTRAMUSCULAR | Status: DC | PRN
  Administered 2019-11-24: 1 mg via INTRAVENOUS

## 2019-11-24 MED ORDER — LABETALOL HCL 5 MG/ML IV SOLN: 10.0000 mg | INTRAVENOUS | Status: AC | PRN

## 2019-11-24 MED ORDER — FUROSEMIDE 10 MG/ML IJ SOLN
80.0000 mg | Freq: Two times a day (BID) | INTRAMUSCULAR | Status: DC
  Administered 2019-11-24: 80 mg via INTRAVENOUS
  Filled 2019-11-24: qty 8

## 2019-11-24 MED ORDER — NITROGLYCERIN IN D5W 200-5 MCG/ML-% IV SOLN
0.0000 ug/min | INTRAVENOUS | Status: DC
  Filled 2019-11-24: qty 250

## 2019-11-24 MED ORDER — LEVALBUTEROL HCL 0.63 MG/3ML IN NEBU: 0.6300 mg | INHALATION_SOLUTION | RESPIRATORY_TRACT | Status: DC | PRN

## 2019-11-24 MED ORDER — SODIUM CHLORIDE 0.9% FLUSH: 10.0000 mL | INTRAVENOUS | Status: DC | PRN

## 2019-11-24 MED ORDER — DEXMEDETOMIDINE HCL IN NACL 400 MCG/100ML IV SOLN
0.1000 ug/kg/h | INTRAVENOUS | Status: DC
  Filled 2019-11-24: qty 100

## 2019-11-24 MED ORDER — EPINEPHRINE HCL 5 MG/250ML IV SOLN IN NS
0.0000 ug/min | INTRAVENOUS | Status: AC
  Administered 2019-11-24: 3 ug/min via INTRAVENOUS
  Filled 2019-11-24: qty 250

## 2019-11-24 MED ORDER — NOREPINEPHRINE 4 MG/250ML-% IV SOLN
0.0000 ug/min | INTRAVENOUS | Status: AC
  Administered 2019-11-24: 8 ug/min via INTRAVENOUS
  Filled 2019-11-24: qty 250

## 2019-11-24 MED ORDER — SODIUM CHLORIDE 0.9 % IV SOLN
INTRAVENOUS | Status: DC | PRN
  Administered 2019-11-24: 500 mL

## 2019-11-24 MED ORDER — CEFAZOLIN SODIUM-DEXTROSE 2-3 GM-%(50ML) IV SOLR
INTRAVENOUS | Status: DC | PRN
  Administered 2019-11-24: 2 g via INTRAVENOUS

## 2019-11-24 MED ORDER — METOPROLOL SUCCINATE ER 25 MG PO TB24: 25.0000 mg | ORAL_TABLET | Freq: Every day | ORAL | Status: DC

## 2019-11-24 MED ORDER — ETOMIDATE 2 MG/ML IV SOLN
INTRAVENOUS | Status: DC | PRN
  Administered 2019-11-24: 8 mg via INTRAVENOUS

## 2019-11-24 MED ORDER — HEPARIN (PORCINE) 25000 UT/250ML-% IV SOLN: 1200.0000 [IU]/h | INTRAVENOUS | Status: DC

## 2019-11-24 MED ORDER — LACTATED RINGERS IV SOLN: INTRAVENOUS | Status: DC | PRN

## 2019-11-24 MED ORDER — SODIUM CHLORIDE 0.9% FLUSH
10.0000 mL | Freq: Two times a day (BID) | INTRAVENOUS | Status: DC
  Administered 2019-11-24 – 2019-11-25 (×2): 10 mL
  Administered 2019-11-25: 20 mL

## 2019-11-24 MED ORDER — ACETAMINOPHEN 325 MG PO TABS
650.0000 mg | ORAL_TABLET | ORAL | Status: DC | PRN
  Administered 2019-11-25: 650 mg via ORAL
  Filled 2019-11-24: qty 2

## 2019-11-24 MED ORDER — PLASMA-LYTE 148 IV SOLN
INTRAVENOUS | Status: DC
  Filled 2019-11-24: qty 2.5

## 2019-11-24 MED ORDER — PROPOFOL 1000 MG/100ML IV EMUL
5.0000 ug/kg/min | INTRAVENOUS | Status: DC
  Administered 2019-11-24: 5 ug/kg/min via INTRAVENOUS
  Administered 2019-11-25: 55 ug/kg/min via INTRAVENOUS
  Administered 2019-11-25: 50 ug/kg/min via INTRAVENOUS
  Filled 2019-11-24 (×2): qty 100

## 2019-11-24 MED ORDER — IOHEXOL 350 MG/ML SOLN
INTRAVENOUS | Status: DC | PRN
  Administered 2019-11-24: 70 mL

## 2019-11-24 MED ORDER — EPHEDRINE SULFATE 50 MG/ML IJ SOLN
INTRAMUSCULAR | Status: DC | PRN
  Administered 2019-11-24: 10 mg via INTRAVENOUS

## 2019-11-24 MED ORDER — ALPRAZOLAM 0.5 MG PO TABS
0.5000 mg | ORAL_TABLET | Freq: Three times a day (TID) | ORAL | Status: DC | PRN
  Administered 2019-11-24: 13:00:00 0.5 mg via ORAL
  Filled 2019-11-24: qty 1

## 2019-11-24 MED ORDER — MIDAZOLAM HCL 2 MG/2ML IJ SOLN
INTRAMUSCULAR | Status: DC | PRN
  Administered 2019-11-24: 2 mg via INTRAVENOUS

## 2019-11-24 MED ORDER — ETOMIDATE 2 MG/ML IV SOLN
INTRAVENOUS | Status: AC
  Filled 2019-11-24: qty 20

## 2019-11-24 MED ORDER — EPINEPHRINE HCL 5 MG/250ML IV SOLN IN NS: 0.5000 ug/min | INTRAVENOUS | Status: DC

## 2019-11-24 MED ORDER — PROPOFOL 10 MG/ML IV BOLUS
INTRAVENOUS | Status: AC
  Filled 2019-11-24: qty 20

## 2019-11-24 MED ORDER — HEPARIN (PORCINE) 25000 UT/250ML-% IV SOLN
800.0000 [IU]/h | INTRAVENOUS | Status: DC
  Administered 2019-11-24: 300 [IU]/h via INTRAVENOUS
  Administered 2019-11-25: 1700 [IU]/h via INTRAVENOUS
  Filled 2019-11-24 (×2): qty 250

## 2019-11-24 MED ORDER — TRANEXAMIC ACID (OHS) PUMP PRIME SOLUTION
2.0000 mg/kg | INTRAVENOUS | Status: DC
  Filled 2019-11-24: qty 1.71

## 2019-11-24 MED ORDER — AMIODARONE HCL IN DEXTROSE 360-4.14 MG/200ML-% IV SOLN
60.0000 mg/h | INTRAVENOUS | Status: AC
  Administered 2019-11-24 (×2): 60 mg/h via INTRAVENOUS
  Filled 2019-11-24: qty 200

## 2019-11-24 MED ORDER — TRANEXAMIC ACID (OHS) BOLUS VIA INFUSION
15.0000 mg/kg | INTRAVENOUS | Status: DC
  Filled 2019-11-24: qty 1280

## 2019-11-24 MED ORDER — HEPARIN SODIUM (PORCINE) 1000 UNIT/ML IJ SOLN
INTRAMUSCULAR | Status: DC | PRN
  Administered 2019-11-24: 3000 [IU] via INTRAVENOUS
  Administered 2019-11-24: 7000 [IU] via INTRAVENOUS

## 2019-11-24 MED ORDER — HEPARIN (PORCINE) IN NACL 1000-0.9 UT/500ML-% IV SOLN
INTRAVENOUS | Status: AC
  Filled 2019-11-24: qty 1000

## 2019-11-24 SURGICAL SUPPLY — 10 items
CATH 5FR JL3.5 JR4 ANG PIG MP (CATHETERS) ×1 IMPLANT
CATH BALLN WEDGE 5F 110CM (CATHETERS) ×1 IMPLANT
DEVICE RAD COMP TR BAND LRG (VASCULAR PRODUCTS) ×1 IMPLANT
GLIDESHEATH SLEND SS 6F .021 (SHEATH) ×1 IMPLANT
GUIDEWIRE INQWIRE 1.5J.035X260 (WIRE) IMPLANT
INQWIRE 1.5J .035X260CM (WIRE) ×2
KIT HEART LEFT (KITS) ×2 IMPLANT
PACK CARDIAC CATHETERIZATION (CUSTOM PROCEDURE TRAY) ×2 IMPLANT
SHEATH GLIDE SLENDER 4/5FR (SHEATH) ×1 IMPLANT
TRANSDUCER W/STOPCOCK (MISCELLANEOUS) ×2 IMPLANT

## 2019-11-24 SURGICAL SUPPLY — 72 items
ANCHOR CATH FOLEY SECURE (MISCELLANEOUS) ×3 IMPLANT
APPLICATOR TIP COSEAL (VASCULAR PRODUCTS) IMPLANT
BAG BANDED W/RUBBER/TAPE 36X54 (MISCELLANEOUS) ×1 IMPLANT
BLADE 11 SAFETY STRL DISP (BLADE) ×1 IMPLANT
BLADE CLIPPER SURG (BLADE) ×2 IMPLANT
BLADE STERNUM SYSTEM 6 (BLADE) ×1 IMPLANT
CATH DIAG EXPO 6F AL1 (CATHETERS) ×1 IMPLANT
CATH DIAG EXPO 6F FR4 (CATHETERS) ×1 IMPLANT
CATH DIAG EXPO 6F VENT PIG 145 (CATHETERS) ×1 IMPLANT
CATH INFINITI 6F MPB2 (CATHETERS) ×1 IMPLANT
CLIP VESOCCLUDE MED 24/CT (CLIP) ×1 IMPLANT
CLIP VESOCCLUDE SM WIDE 24/CT (CLIP) ×1 IMPLANT
COVER DOME SNAP 22 D (MISCELLANEOUS) ×1 IMPLANT
COVER WAND RF STERILE (DRAPES) ×1 IMPLANT
DRAPE C-ARM 42X72 X-RAY (DRAPES) ×2 IMPLANT
DRAPE CARDIOVASCULAR INCISE (DRAPES) ×1
DRAPE CV SPLIT W-CLR ANES SCRN (DRAPES) ×1 IMPLANT
DRAPE HALF SHEET 40X57 (DRAPES) ×2 IMPLANT
DRAPE PERI GROIN 82X75IN TIB (DRAPES) ×1 IMPLANT
DRAPE SLUSH/WARMER DISC (DRAPES) ×2 IMPLANT
DRAPE SRG 135X102X78XABS (DRAPES) IMPLANT
DRSG TEGADERM 4X4.5 CHG (GAUZE/BANDAGES/DRESSINGS) ×1 IMPLANT
ELECT BLADE 4.0 EZ CLEAN MEGAD (MISCELLANEOUS) ×2
ELECTRODE BLDE 4.0 EZ CLN MEGD (MISCELLANEOUS) IMPLANT
FELT TEFLON 1X6 (MISCELLANEOUS) ×1 IMPLANT
GLOVE BIO SURGEON STRL SZ 6 (GLOVE) ×2 IMPLANT
GLOVE BIOGEL PI IND STRL 6.5 (GLOVE) IMPLANT
GLOVE BIOGEL PI IND STRL 7.0 (GLOVE) IMPLANT
GLOVE BIOGEL PI IND STRL 8 (GLOVE) IMPLANT
GLOVE BIOGEL PI INDICATOR 6.5 (GLOVE) ×1
GLOVE BIOGEL PI INDICATOR 7.0 (GLOVE) ×1
GLOVE BIOGEL PI INDICATOR 8 (GLOVE) ×1
GLOVE NEODERM STRL 7.5 LF PF (GLOVE) ×3 IMPLANT
GLOVE SURG NEODERM 7.5  LF PF (GLOVE) ×2
GLOVE SURG SS PI 6.5 STRL IVOR (GLOVE) ×1 IMPLANT
GOWN STRL REUS W/ TWL LRG LVL3 (GOWN DISPOSABLE) ×4 IMPLANT
GOWN STRL REUS W/ TWL XL LVL3 (GOWN DISPOSABLE) IMPLANT
GOWN STRL REUS W/TWL LRG LVL3 (GOWN DISPOSABLE) ×3
GOWN STRL REUS W/TWL XL LVL3 (GOWN DISPOSABLE) ×1
GRAFT HEMASHIELD 10MM (Graft) ×1 IMPLANT
GRAFT VASC STRG 30X10STRL (Graft) IMPLANT
INSERT FOGARTY SM (MISCELLANEOUS) ×3 IMPLANT
KIT BASIN OR (CUSTOM PROCEDURE TRAY) ×2 IMPLANT
LOOP VESSEL MINI RED (MISCELLANEOUS) ×1 IMPLANT
NS IRRIG 1000ML POUR BTL (IV SOLUTION) ×8 IMPLANT
PACK CHEST (CUSTOM PROCEDURE TRAY) ×2 IMPLANT
PAD ARMBOARD 7.5X6 YLW CONV (MISCELLANEOUS) ×4 IMPLANT
PAD ELECT DEFIB RADIOL ZOLL (MISCELLANEOUS) ×2 IMPLANT
PUMP SET IMPELLA 5.5 US (CATHETERS) ×1 IMPLANT
SEALANT SURG COSEAL 4ML (VASCULAR PRODUCTS) ×1 IMPLANT
SEALANT SURG COSEAL 8ML (VASCULAR PRODUCTS) ×1 IMPLANT
STAPLER VISISTAT 35W (STAPLE) ×1 IMPLANT
SUT ETHIBOND X763 2 0 SH 1 (SUTURE) ×2 IMPLANT
SUT ETHILON 3 0 PS 1 (SUTURE) ×2 IMPLANT
SUT PDS AB 1 CTX 36 (SUTURE) ×1 IMPLANT
SUT PROLENE 4 0 RB 1 (SUTURE) ×1
SUT PROLENE 4 0 SH DA (SUTURE) ×1 IMPLANT
SUT PROLENE 4-0 RB1 .5 CRCL 36 (SUTURE) IMPLANT
SUT PROLENE 5 0 C 1 24 (SUTURE) ×3 IMPLANT
SUT SILK  1 MH (SUTURE) ×2
SUT SILK 1 MH (SUTURE) ×4 IMPLANT
SUT SILK 1 TIES 10X30 (SUTURE) ×1 IMPLANT
SUT SILK 2 0 SH CR/8 (SUTURE) ×1 IMPLANT
SUT VIC AB 2-0 CT1 27 (SUTURE) ×1
SUT VIC AB 2-0 CT1 TAPERPNT 27 (SUTURE) IMPLANT
SYR 10ML LL (SYRINGE) ×2 IMPLANT
SYR 20ML ECCENTRIC (SYRINGE) ×1 IMPLANT
TOWEL GREEN STERILE (TOWEL DISPOSABLE) ×2 IMPLANT
TOWEL GREEN STERILE FF (TOWEL DISPOSABLE) ×1 IMPLANT
TRAY FOLEY SLVR 16FR TEMP STAT (SET/KITS/TRAYS/PACK) ×1 IMPLANT
WATER STERILE IRR 1000ML POUR (IV SOLUTION) ×4 IMPLANT
WIRE EMERALD 3MM-J .035X260CM (WIRE) ×1 IMPLANT

## 2019-11-24 NOTE — Consult Note (Signed)
[address]       [place] [address]             [phone]        Kymir E Crisco [place] Medical Record #[id number] Date of Birth: 09/30/1961  Referring: No ref. provider found Primary Care: Monico Blitz, MD Primary Cardiologist:Samuel Domenic Polite, MD  Chief Complaint:    Chief Complaint  Patient presents with  . Shortness of Breath    History of Present Illness:      58 yo man referred for consideration of CABG. He was in [place] until last week when he presented with SOB and found to be in Afib which lead to heart failure sx. He was transferred to [facility] and underwent LHC today demonstrating severe 3V CAD and severely depressed LV function. Since cath, he has been confused. He has not responded as expected to IV milrinone gtt or IV lasix bolus.  Current Activity/ Functional Status: Patient is independent with mobility/ambulation, transfers, ADL's, IADL's.   Zubrod Score: At the time of surgery this patient's most appropriate activity status/level should be described as: []     0    Normal activity, no symptoms []     1    Restricted in physical strenuous activity but ambulatory, able to do out light work []     2    Ambulatory and capable of self care, unable to do work activities, up and about                 more than 50%  Of the time                            []     3    Only limited self care, in bed greater than 50% of waking hours []     4    Completely disabled, no self care, confined to bed or chair []     5    Moribund  Past Medical History:  Diagnosis Date  . Arthritis   . COPD (chronic obstructive pulmonary disease) ([place])   . Essential hypertension   . Type 2 diabetes mellitus ([place])     Past Surgical History:  Procedure Laterality Date  . BACK SURGERY    . FOOT SURGERY    . HAND SURGERY    . RIGHT/LEFT HEART CATH AND CORONARY ANGIOGRAPHY N/A 11/24/2019   Procedure: RIGHT/LEFT HEART CATH AND CORONARY ANGIOGRAPHY;  Surgeon: Larey Dresser, MD;  Location: [facility];  Service: Cardiovascular;  Laterality: N/A;    Social History   Tobacco Use  Smoking Status Current Every Day Smoker  . Packs/day: 1.00  . Years: 30.00  . Pack years: 30.00  . Types: Cigarettes  Smokeless Tobacco Never Used    Social History   Substance and Sexual Activity  Alcohol Use Yes   Comment: Prior history of excessive intake     No Known Allergies  Current Facility-Administered Medications  Medication Dose Route Frequency Provider Last Rate Last Admin  . 0.9 %  sodium chloride infusion  250 mL Intravenous PRN Larey Dresser, MD      . acetaminophen (TYLENOL) tablet 650 mg  650 mg Oral Q4H PRN Larey Dresser, MD      . ALPRAZolam Duanne Moron) tablet 0.5 mg  0.5 mg Oral TID PRN Barb Merino, MD   0.5 mg at 11/24/19 1256  . amiodarone (NEXTERONE PREMIX) 360-4.14 MG/200ML-% (  1.8 mg/mL) IV infusion  60 mg/hr Intravenous Continuous Laurey Morale, MD 33.3 mL/hr at 11/24/19 1409 60 mg/hr at 11/24/19 1409   Followed by  . amiodarone (NEXTERONE PREMIX) 360-4.14 MG/200ML-% (1.8 mg/mL) IV infusion  30 mg/hr Intravenous Continuous Laurey Morale, MD      . Melene Muller ON 11/25/2019] aspirin EC tablet 81 mg  81 mg Oral Daily Laurey Morale, MD      . Chlorhexidine Gluconate Cloth 2 % PADS 6 each  6 each Topical Q0600 Cleora Fleet, MD   6 each at 11/24/19 [phone]  . digoxin (LANOXIN) tablet 0.125 mg  0.125 mg Oral Daily Jonelle Sidle, MD   0.125 mg at 11/23/19 0915  . furosemide (LASIX) 250 mg in dextrose 5 % 250 mL (1 mg/mL) infusion  12 mg/hr Intravenous Continuous Simmons, Brittainy M, PA-C      . gabapentin (NEURONTIN) capsule 600 mg  600 mg Oral BID Dorcas Carrow, MD   600 mg at 11/24/19 1256  . haloperidol lactate (HALDOL) injection 5 mg  5 mg Intravenous Q6H PRN Johnson, Clanford L, MD   5 mg at 11/24/19 1147  . heparin ADULT infusion 100 units/mL (25000 units/273mL sodium chloride 0.45%)  1,200 Units/hr Intravenous Continuous  Earnie Larsson, RPH      . hydrALAZINE (APRESOLINE) injection 10 mg  10 mg Intravenous Q20 Min PRN Laurey Morale, MD      . insulin aspart (novoLOG) injection 0-15 Units  0-15 Units Subcutaneous TID WC Johnson, Clanford L, MD   5 Units at 11/24/19 1433  . insulin aspart (novoLOG) injection 0-5 Units  0-5 Units Subcutaneous QHS Standley Dakins L, MD   4 Units at 11/20/19 2243  . insulin aspart (novoLOG) injection 2 Units  2 Units Subcutaneous TID WC Laural Benes, Clanford L, MD   2 Units at 11/24/19 0802  . insulin glargine (LANTUS) injection 8 Units  8 Units Subcutaneous Daily Standley Dakins L, MD   8 Units at 11/23/19 0914  . ipratropium-albuterol (DUONEB) 0.5-2.5 (3) MG/3ML nebulizer solution 3 mL  3 mL Inhalation BID Laural Benes, Clanford L, MD   3 mL at 11/23/19 0839  . labetalol (NORMODYNE) injection 10 mg  10 mg Intravenous Q10 min PRN Laurey Morale, MD      . levalbuterol Pauline Aus) nebulizer solution 0.63 mg  0.63 mg Nebulization Q4H PRN Dorcas Carrow, MD      . Melene Muller ON 11/25/2019] metoprolol succinate (TOPROL-XL) 24 hr tablet 25 mg  25 mg Oral Daily Laurey Morale, MD      . milrinone (PRIMACOR) 20 MG/100 ML (0.2 mg/mL) infusion  0.25 mcg/kg/min Intravenous Continuous Laurey Morale, MD 6.4 mL/hr at 11/24/19 1212 0.25 mcg/kg/min at 11/24/19 1212  . mometasone-formoterol (DULERA) 200-5 MCG/ACT inhaler 2 puff  2 puff Inhalation BID Laural Benes, Clanford L, MD   2 puff at 11/23/19 0856  . nicotine (NICODERM CQ - dosed in mg/24 hours) patch 21 mg  21 mg Transdermal Daily PRN Johnson, Clanford L, MD      . ondansetron (ZOFRAN) injection 4 mg  4 mg Intravenous Q6H PRN Laurey Morale, MD      . potassium chloride (KLOR-CON) CR tablet 10 mEq  10 mEq Oral BID Laural Benes, Clanford L, MD   10 mEq at 11/23/19 2114  . sodium chloride flush (NS) 0.9 % injection 3 mL  3 mL Intravenous Q12H Robbie Lis M, PA-C   3 mL at 11/23/19 2119  . sodium chloride flush (NS)  0.9 % injection 3 mL  3 mL  Intravenous Q12H Laurey MoraleMcLean, Dalton S, MD   3 mL at 11/24/19 1133  . sodium chloride flush (NS) 0.9 % injection 3 mL  3 mL Intravenous PRN Laurey MoraleMcLean, Dalton S, MD      . spironolactone (ALDACTONE) tablet 12.5 mg  12.5 mg Oral Daily Jonelle SidleMcDowell, Samuel G, MD   12.5 mg at 11/23/19 0915    Medications Prior to Admission  Medication Sig Dispense Refill Last Dose  . aspirin 81 MG tablet Take 81 mg by mouth daily.   11/20/2019 at Unknown time  . COMBIVENT RESPIMAT 20-100 MCG/ACT AERS respimat Inhale 2 Inhalers into the lungs as needed.  5 11/19/2019 at Unknown time  . gabapentin (NEURONTIN) 600 MG tablet Take 1 tablet by mouth every 12 (twelve) hours.   11/19/2019 at Unknown time  . glimepiride (AMARYL) 2 MG tablet Take 2 mg by mouth daily. Reported on 11/28/2015   11/20/2019 at Unknown time  . INVOKAMET XR [phone] MG TB24 Take 2 tablets by mouth daily.   11/20/2019 at Unknown time  . lisinopril (PRINIVIL,ZESTRIL) 20 MG tablet Take 20 mg by mouth daily.   11/20/2019 at Unknown time  . SYMBICORT 160-4.5 MCG/ACT inhaler Inhale 2 puffs into the lungs 2 (two) times daily.    11/19/2019 at Unknown time  . VICTOZA 18 MG/3ML SOPN Inject 1.2 Units into the skin daily.  2 11/19/2019 at Unknown time  . ADVAIR DISKUS 250-50 MCG/DOSE AEPB Inhale 2 puffs into the lungs 2 (two) times daily.  3   . albuterol (PROVENTIL) (2.5 MG/3ML) 0.083% nebulizer solution Inhale 1 mL into the lungs 4 (four) times daily as needed.  2   . INVOKANA 100 MG TABS tablet Take 100 mg by mouth daily.  2   . metFORMIN (GLUCOPHAGE) 500 MG tablet Take 1,000 mg by mouth 2 (two) times daily with a meal.       Family History  Problem Relation Age of Onset  . Arthritis Other   . Lung disease Other   . Asthma Other   . Diabetes Other   . Heart disease Sister        Tumor?     Review of Systems:   ROS Review of systems not obtained due to patient factors.     Cardiac Review of Systems: Y or  [    ]= no  Chest Pain [    ]  Resting SOB [    ] Exertional SOB  [  ]  Orthopnea [  ]   Pedal Edema [   ]    Palpitations [  ] Syncope  [  ]   Presyncope [   ]  General Review of Systems: [Y] = yes [  ]=no Constitional: recent weight change [  ]; anorexia [  ]; fatigue [  ]; nausea [  ]; night sweats [  ]; fever [  ]; or chills [  ]                                                               Dental: Last Dentist visit:   Eye : blurred vision [  ]; diplopia [   ]; vision changes [  ];  Amaurosis fugax[  ]; Resp: cough [  ];  wheezing[  ];  hemoptysis[  ]; shortness of breath[  ]; paroxysmal nocturnal dyspnea[  ]; dyspnea on exertion[  ]; or orthopnea[  ];  GI:  gallstones[  ], vomiting[  ];  dysphagia[  ]; melena[  ];  hematochezia [  ]; heartburn[  ];   Hx of  Colonoscopy[  ]; GU: kidney stones [  ]; hematuria[  ];   dysuria [  ];  nocturia[  ];  history of     obstruction [  ]; urinary frequency [  ]             Skin: rash, swelling[  ];, hair loss[  ];  peripheral edema[  ];  or itching[  ]; Musculosketetal: myalgias[  ];  joint swelling[  ];  joint erythema[  ];  joint pain[  ];  back pain[  ];  Heme/Lymph: bruising[  ];  bleeding[  ];  anemia[  ];  Neuro: TIA[  ];  headaches[  ];  stroke[  ];  vertigo[  ];  seizures[  ];   paresthesias[  ];  difficulty walking[  ];  Psych:depression[  ]; anxiety[  ];  Endocrine: diabetes[  ];  thyroid dysfunction[  ];            Physical Exam: BP 111/86   Pulse 85   Temp (!) 97.4 F (36.3 C) (Axillary)   Resp (!) 22   Ht 5\' 10"  (1.778 m)   Wt 85.3 kg   SpO2 93%   BMI 26.98 kg/m    General appearance: delirious Neck: no adenopathy, no carotid bruit, no JVD, supple, symmetrical, trachea midline and thyroid not enlarged, symmetric, no tenderness/mass/nodules Resp: diminished breath sounds bibasilar Cardio: regular rate and rhythm, S1, S2 normal, no murmur, click, rub or gallop Genitalia: defer exam Extremities: cool,dusky, mild edema Neurologic: Mental status: Alert, oriented, thought  content appropriate, very sluggish  Diagnostic Studies & Laboratory data:     Recent Radiology Findings:   CT HEAD WO CONTRAST  Result Date: 11/24/2019 CLINICAL DATA:  Head trauma, headache. Patient fell in room striking head. Red mark on forehead and laceration of the nose. On anticoagulation. EXAM: CT HEAD WITHOUT CONTRAST TECHNIQUE: Contiguous axial images were obtained from the base of the skull through the vertex without intravenous contrast. COMPARISON:  None. FINDINGS: Brain: No intracranial hemorrhage, mass effect, or midline shift. No hydrocephalus. The basilar cisterns are patent. No evidence of territorial infarct or acute ischemia. No extra-axial or intracranial fluid collection. Vascular: Atherosclerosis of skullbase vasculature without hyperdense vessel or abnormal calcification. Skull: No fracture or focal lesion. Sinuses/Orbits: Trace mucosal thickening of the maxillary and left frontal sinuses without fluid level or fracture. Orbits are unremarkable. Mastoid air cells are clear. Other: None. IMPRESSION: No acute intracranial abnormality. No skull fracture. Electronically Signed   By: 11/26/2019 M.D.   On: 11/24/2019 01:20   CARDIAC CATHETERIZATION  Result Date: 11/24/2019 1. Low output HF with elevated filling pressures. 2. Severe 3 vessel disease with heavily calcified and severely stenotic proximal LAD.  I will increase IV Lasix and start him on milrinone 0.25 to promote diuresis. Diabetic with 3VD including proximal LAD. He will need cardiac surgery evaluation.  If not taken for surgery, PCI would be an option (LAD lesion would require atherectomy).   11/26/2019 EKG SITE RITE  Result Date: 11/24/2019 If Site Rite image not attached, placement could not be confirmed due to current cardiac rhythm.    I have independently reviewed the above  radiologic studies and discussed with the patient   Recent Lab Findings: Lab Results  Component Value Date   WBC 9.6 11/23/2019   HGB  16.0 11/24/2019   HCT 47.0 11/24/2019   PLT 242 11/23/2019   GLUCOSE 181 (H) 11/24/2019   CHOL 175 11/21/2019   TRIG 70 11/21/2019   HDL 45 11/21/2019   LDLCALC 116 (H) 11/21/2019   ALT 48 (H) 11/20/2019   AST 28 11/20/2019   NA 140 11/24/2019   K 3.6 11/24/2019   CL 104 11/24/2019   CREATININE 1.11 11/24/2019   BUN 33 (H) 11/24/2019   CO2 22 11/24/2019   TSH 1.658 11/20/2019   HGBA1C 12.1 (H) 11/20/2019      Assessment / Plan:      58 yo man with severe ischemic heart disease. Referral for CABG but I am very concerned about his current condition. He needs mechanical support. Impella LVAD is best option. We should do this asap.     I  spent 30 minutes counseling the patient face to face.   Calil Amor Z. Vickey SagesAtkins, MD [phone] 11/24/2019 4:23 PM

## 2019-11-24 NOTE — Progress Notes (Signed)
Pt placed on BiPAP 12/6 per MD order due to pt having periods of apnea while on the CPAP. Pt tolerating okay at this time. RT will continue to monitor.

## 2019-11-24 NOTE — Anesthesia Postprocedure Evaluation (Signed)
Anesthesia Post Note  Patient: Kevin Underwood  Procedure(s) Performed: PLACEMENT OF IMPELLA 5.5 LEFT VENTRICULAR ASSIST DEVICE (N/A ) TRANSESOPHAGEAL ECHOCARDIOGRAM (TEE) (N/A )     Patient location during evaluation: SICU Anesthesia Type: General Level of consciousness: sedated Pain management: pain level controlled Vital Signs Assessment: post-procedure vital signs reviewed and stable Respiratory status: patient remains intubated per anesthesia plan Cardiovascular status: stable Postop Assessment: no apparent nausea or vomiting Anesthetic complications: no    Last Vitals:  Vitals:   11/24/19 2100 11/24/19 2200  BP: 93/80   Pulse: 83 78  Resp: 16 16  Temp: (!) 35 C (!) 35.4 C  SpO2: 94% 100%    Last Pain:  Vitals:   11/24/19 2000  TempSrc: Core  PainSc:                  Cerina Leary DAVID

## 2019-11-24 NOTE — Progress Notes (Signed)
ANTICOAGULATION CONSULT NOTE - Initial Consult  Pharmacy Consult for heparin Indication: atrial fibrillation  No Known Allergies  Patient Measurements: Height: 5\' 10"  (177.8 cm) Weight: 188 lb (85.3 kg) IBW/kg (Calculated) : 73 Heparin Dosing Weight: 85  Vital Signs: Temp: 97.4 F (36.3 C) (12/16 1010) Temp Source: Axillary (12/16 1010) BP: 113/93 (12/16 1010) Pulse Rate: 95 (12/16 1010)  Labs: Recent Labs    11/22/19 0414 11/23/19 0453 11/24/19 0439  HGB 15.9 15.6  --   HCT 48.1 48.6  --   PLT 268 242  --   CREATININE 0.90 1.01 1.11    Estimated Creatinine Clearance: 74.9 mL/min (by C-G formula based on SCr of 1.11 mg/dL).   Medical History: Past Medical History:  Diagnosis Date  . Arthritis   . COPD (chronic obstructive pulmonary disease) ([place])   . Essential hypertension   . Type 2 diabetes mellitus [facility])    Assessment: 58 year old male transferred from [facility] with new systolic heart failure. Patient also with new onset afib started on apixaban prior to transfer but subsequently held d/t need for cath. Last dose was 12/14 at 2100. New orders post cath to start IV heparin.   Will follow aptt also until heparin levels correlate.   Goal of Therapy:  Heparin level 0.3-0.7 units/ml aPTT 66-102 seconds Monitor platelets by anticoagulation protocol: Yes   Plan:  Start heparin this evening at 1200 units/hr Check 6 hour heparin level and aptt  Erin Hearing PharmD., BCPS Clinical Pharmacist 11/24/2019 10:55 AM

## 2019-11-24 NOTE — Progress Notes (Signed)
Pt placed on CPAP machine at 1441 per pt request for a nap. Pt states he feels comfortable on the machine and has a good saturation of 97%. RT will continue to monitor pt status.

## 2019-11-24 NOTE — Anesthesia Procedure Notes (Signed)
Central Venous Catheter Insertion Performed by: Oleta Mouse, MD, anesthesiologist Start/End12/16/2020 5:00 PM, 11/24/2019 5:11 PM Patient location: OR. Preanesthetic checklist: patient identified, IV checked, site marked, risks and benefits discussed, surgical consent, monitors and equipment checked, pre-op evaluation, timeout performed and anesthesia consent Lidocaine 1% used for infiltration and patient sedated Hand hygiene performed  and maximum sterile barriers used  Catheter size: 9 Fr Total catheter length 10. MAC introducer Procedure performed using ultrasound guided technique. Ultrasound Notes:anatomy identified, needle tip was noted to be adjacent to the nerve/plexus identified, no ultrasound evidence of intravascular and/or intraneural injection and image(s) printed for medical record Attempts: 1 Following insertion, line sutured. Post procedure assessment: blood return through all ports, free fluid flow and no air  Patient tolerated the procedure well with no immediate complications.

## 2019-11-24 NOTE — Progress Notes (Signed)
Pt lethargic, RASS -2. Only arouses with continuous stimulation. Pt on CPAP. ABG order placed. Dr. Orvan Seen to bedside. Spouse at bedside.   PICC team placed double lumen in R brachial. SpO2 83. Placed on BiPAP by RT.   Pt progressively declining. Dr. Aundra Dubin at bedside. Plan for urgent impella placement, in agreement with CVTS. RN to find spouse for consent. Pt needing intubated, Rapid Response called to assist. Anesthesia and PCCM consulted, decided to intubate in OR.  Pt transferred to OR with anesthesia and OR staff at bedside.  Spouse in 2H waiting room, phone number updated.

## 2019-11-24 NOTE — Progress Notes (Signed)
Patients heart rate  from 100 -120.  Spoke with CHF team regarding heart rate and verification of metoprolol administration.  Metoprolol administered.   Cath lab called, will be picking up pt soon.  Pt requested to not give any information to family and friends and wait until he talks to them first.

## 2019-11-24 NOTE — Progress Notes (Signed)
Performed the carotid artery duplex for the patient's Pre-CABG testing. When returning to perform the remainder of the Pre-CABG testing, the patient was occupied receiving a PICC line. At 1645 the RN was called to ask if the patient was finished receiving the PICC line. Informed that the patient's status was declining and he was being transferred to [facility] and subsequently to the OR to receive an Impella. The remainder of the Pre-CABG testing will not be performed today (11/24/2019).  Please call if the testing is still needed.   11/24/19 4:59 PM Kevin Underwood RVT

## 2019-11-24 NOTE — Transfer of Care (Signed)
Immediate Anesthesia Transfer of Care Note  Patient: Kevin Underwood  Procedure(s) Performed: PLACEMENT OF IMPELLA 5.5 LEFT VENTRICULAR ASSIST DEVICE (N/A ) TRANSESOPHAGEAL ECHOCARDIOGRAM (TEE) (N/A )  Patient Location: SICU  Anesthesia Type:General  Level of Consciousness: Patient remains intubated per anesthesia plan  Airway & Oxygen Therapy: Patient remains intubated per anesthesia plan  Post-op Assessment: Report given to RN and Post -op Vital signs reviewed and stable  Post vital signs: Reviewed and stable  Last Vitals:  Vitals Value Taken Time  BP 109/89 11/24/19 1923  Temp 34.9 C 11/24/19 1929  Pulse 86 11/24/19 1929  Resp 16 11/24/19 1929  SpO2 92 % 11/24/19 1929  Vitals shown include unvalidated device data.  Last Pain:  Vitals:   11/24/19 1513  TempSrc:   PainSc: Asleep         Complications: No apparent anesthesia complications

## 2019-11-24 NOTE — Progress Notes (Signed)
Patient ID: Kevin Underwood, male   DOB: 04-09-1961, 58 y.o.   MRN: [id number]  Called to bedside due to suction events on Impella and low cardiac output. Runs of NSVT.  Position of Impella evaluated under echo, inflow is at 4.1 cm and is not in contact with the septum.  Initially, the interventricular septum was shifted to the left.  The RV is moderately dilated and moderately dysfunctional.  Speed adjusted up and down, left at P7 with 3.6 L/min flow and no alarms.  SVT 2000, norepinephrine able to be titrated down and milrinone increased to 0.375.  Patient is making clear urine, CVP 8-9 with PA 36/20.   Send co-ox, lactate, CMET, LDH.    MAP around 80 currently, continue to titrate down on norepinephrine as able.    CRITICAL CARE Performed by: Loralie Champagne  Total critical care time: 30 minutes  Critical care time was exclusive of separately billable procedures and treating other patients.  Critical care was necessary to treat or prevent imminent or life-threatening deterioration.  Critical care was time spent personally by me on the following activities: development of treatment plan with patient and/or surrogate as well as nursing, discussions with consultants, evaluation of patient's response to treatment, examination of patient, obtaining history from patient or surrogate, ordering and performing treatments and interventions, ordering and review of laboratory studies, ordering and review of radiographic studies, pulse oximetry and re-evaluation of patient's condition.  Loralie Champagne 11/24/2019 9:15 PM

## 2019-11-24 NOTE — Anesthesia Preprocedure Evaluation (Signed)
Anesthesia Evaluation  Patient identified by MRN, date of birth, ID band Patient confused    Reviewed: Unable to perform ROS - Chart review onlyPreop documentation limited or incomplete due to emergent nature of procedure.  Airway Mallampati: III  TM Distance: >3 FB Neck ROM: Full    Dental  (+) Edentulous Upper   Pulmonary sleep apnea , COPD, Current Smoker,  bipap    + decreased breath sounds      Cardiovascular hypertension, +CHF   Rhythm:Irregular     Neuro/Psych    GI/Hepatic   Endo/Other  diabetes  Renal/GU      Musculoskeletal  (+) Arthritis ,   Abdominal   Peds  Hematology   Anesthesia Other Findings   Reproductive/Obstetrics                             Anesthesia Physical Anesthesia Plan  ASA: V and emergent  Anesthesia Plan: General   Post-op Pain Management:    Induction: Intravenous and Rapid sequence  PONV Risk Score and Plan: 1 and Treatment may vary due to age or medical condition  Airway Management Planned: Oral ETT  Additional Equipment: Arterial line, CVP, PA Cath, TEE and Ultrasound Guidance Line Placement  Intra-op Plan:   Post-operative Plan: Post-operative intubation/ventilation  Informed Consent:     Only emergency history available and History available from chart only  Plan Discussed with: CRNA and Surgeon  Anesthesia Plan Comments:         Anesthesia Quick Evaluation

## 2019-11-24 NOTE — Op Note (Signed)
Procedure(s): PLACEMENT OF IMPELLA 5.5 LEFT VENTRICULAR ASSIST DEVICE TRANSESOPHAGEAL ECHOCARDIOGRAM (TEE) Procedure Note  Kevin Underwood male 58 y.o. 11/24/2019  Procedure(s) and Anesthesia Type:    * PLACEMENT OF IMPELLA 5.5 LEFT VENTRICULAR ASSIST DEVICE - General    * TRANSESOPHAGEAL ECHOCARDIOGRAM (TEE) - General  Surgeon(s) and Role:    * Wonda Olds, MD - Primary   Indications: The patient was admitted to the hospital with a brief history of shortness of breath, atrial fibrillation, and heart failure.  Left heart catheterization today revealed findings of ischemic cardiomyopathy.  He was subsequently found to be clinically hypoperfusing and he is now taken urgently to the operating room for Impella LVAD insertion.  Surgeon: Wonda Olds   Assistants: Staff  Anesthesia: General endotracheal anesthesia  ASA Class: 5    Procedure Detail  PLACEMENT OF IMPELLA 5.5 LEFT VENTRICULAR ASSIST DEVICE, TRANSESOPHAGEAL ECHOCARDIOGRAM (TEE) After informed consent he was taken to the operating room at [facility] on 11/24/2019 and placed in the supine position.  Anesthesia was begun in a smooth fashion by the general endotracheal technique.  After confirming adequate anesthesia, deep invasive monitoring lines were placed.  The anterior neck chest abdomen and groins were prepped and draped as a sterile field using Betadine.  A preop surgical pause was performed.  An incision was made in the right deltopectoral groove and carried down to the subcutaneous tissue.  The pectoralis major and minor muscles were divided with electrocautery.  The right axillary artery was encircled.  7000 units of heparin were given intravenously.  A 10 mm graft was sewn end-to-side to the axillary artery with a running suture of 5-0 Prolene.  The graft was then used as a conduit through which wire access of the left ventricle was obtained under fluoroscopic guidance and TEE guidance.  Once the  wire was placed within the ventricle the Impella 5.5 LVAD was inserted in a modified Seldinger technique through the graft conduit and into the ventricle.  The device is seated well.  The wire was withdrawn and the foot device was turned on.  The  device was then appropriately positioned under TEE guidance.  This was then secured at the level of the skin.  The incision was closed in layers.  This concluded the procedure and I was present and participated in all aspects all sponge instruments and needle counts were correct. Findings: As above  Estimated Blood Loss:  less than 100 mL         Drains: None         Total IV Fluids: Per anesthesia  Blood Given: none          Specimens: None         Implants: none        Complications:  * No complications entered in OR log *         Disposition: ICU - intubated and critically ill.         Condition: stable

## 2019-11-24 NOTE — Progress Notes (Signed)
Pt is currently been monitored frequently during this time.  Pt increase in heart rate has been baseline, since admission.

## 2019-11-24 NOTE — Progress Notes (Addendum)
Addendum: ACT down to 142. Impella running at 10.7 mL/hr (heparin via purge 535 units/hr).   Plan: Start systemic Heparin at 300 units/hr (total heparin including purge = 835 units/hr providing 9.7 units/kg/hr).  RN to continue to monitor ACT per protocol.   Sloan Leiter, PharmD, BCPS, BCCCP Clinical Pharmacist Please refer to [facility] for [place] numbers 11/24/2019, 9:42 PM    ANTICOAGULATION CONSULT NOTE - Initial Consult  Pharmacy Consult for Heparin Indication: Impella  No Known Allergies  Patient Measurements: Height: 5\' 10"  (177.8 cm) Weight: 188 lb (85.3 kg) IBW/kg (Calculated) : 73 Heparin Dosing Weight: 85.3 kg  Vital Signs: Temp: 97.4 F (36.3 C) (12/16 1010) Temp Source: Axillary (12/16 1010) BP: 122/108 (12/16 1914) Pulse Rate: 87 (12/16 1914)  Labs: Recent Labs    11/22/19 0414 11/23/19 0453 11/24/19 0439 11/24/19 0912 11/24/19 1718 11/24/19 1740  HGB 15.9 15.6  --  16.0 16.3 13.3  HCT 48.1 48.6  --  47.0 48.0 39.0  PLT 268 242  --   --   --   --   CREATININE 0.90 1.01 1.11  --   --   --     Estimated Creatinine Clearance: 74.9 mL/min (by C-G formula based on SCr of 1.11 mg/dL).   Medical History: Past Medical History:  Diagnosis Date  . Arthritis   . COPD (chronic obstructive pulmonary disease) ([place])   . Essential hypertension   . Type 2 diabetes mellitus [facility])     Assessment: 58 year old male admitted with newly documented cardiomyopathy with paroxysmal atrial fibrillation with RVR. Patient had progressively worsening hypoxia and mental status over the course of the day requiring emergent Impella placement in the OR and intubation. Heparin being started post Impella surgery.   Hgb is down from 16 to 13.3. ACT at 1757 is elevated at 237 after receiving heparin boluses in the OR. Heparin per purge solution to start. Nurse will monitor ACTs and plan to start systemic heparin drip when ACT is < 160.   Goal of Therapy:  Target ACT:  160-180 seconds Monitor platelets by anticoagulation protocol: Yes   Plan:  - Will start systemic Heparin when ACT < [place], PharmD, BCPS, BCCCP Clinical Pharmacist Please refer to [facility] for [place] numbers 11/24/2019,8:23 PM

## 2019-11-24 NOTE — Progress Notes (Signed)
PROGRESS NOTE    ABDULAI Underwood  NKN:[id number] DOB: 11-Dec-1960 DOA: 11/20/2019 PCP: Kirstie Peri, MD    Brief Narrative:  58 y.o.malewith medical history significant ofHypertension, type 2 Diabetes mellitus, COPD, and arthritis was recently discharged from [facility] last week where he reportedly was treated for pneumonia.  He presented with nonspecific complaints of progressive shortness of breath and swelling in the lower extremities.  He also complained of right upper quadrant abdominal pain. After admission, he was found to have new onset A. fib with RVR, cardiomyopathy with ejection fraction 20%.   Assessment & Plan:   Principal Problem:   Atrial fibrillation with RVR (HCC) Active Problems:   Acute congestive heart failure (HCC)   Acute respiratory distress   COPD (chronic obstructive pulmonary disease) (HCC)   Hypertension   Type 2 diabetes mellitus (HCC)   Cardiomyopathy (HCC)   Acute systolic CHF (congestive heart failure) (HCC)   History of alcohol abuse Quit 6 mos ago   History of recreational drug use   PAF (paroxysmal atrial fibrillation) (HCC)   Cardiac volume overload   Elevated brain natriuretic peptide (BNP) level   Leg edema, right   Hyponatremia   Abnormal electrocardiogram (ECG) (EKG)   Hyperglycemia   Tremulousness   Restlessness and agitation   OSA (obstructive sleep apnea)   RUQ abdominal pain  Acute systolic congestive heart failure due to ischemic cardiomyopathy: New diagnosis. IV Lasix and blood pressure control. Started on milrinone.  Digoxin, Aldactone and Toprol XL.  Daily electrolytes. Strict intake and output.  Low-sodium diet.  Followed by advanced heart failure treatment.  Ischemic cardiomyopathy: Cardiac cath with severe triple-vessel disease. On heparin infusion.  Further ischemic work-up pending.  Possible CABG. Continue aspirin.  Currently on heparin.  CTS consulted.  A. fib with RVR: Currently in sinus rhythm.  On  heparin.  Amiodarone to keep patient in sinus rhythm.  Type 2 diabetes: Poorly controlled.  Currently on insulin.  Hemoglobin A1c of 12.  Obstructive sleep apnea: Clinical suspicion.  Will need sleep apnea study after discharge.  Anxiety: Patient had severe anxiety attack in the hospital.  Denies alcohol use and withdrawal.  Previous history of drug abuse: Currently denies any drug use.    DVT prophylaxis: Heparin infusion Code Status: Full code Family Communication: Wife at the bedside Disposition Plan: Pending clinical improvement.  Remains critically sick.   Consultants:   Cardiology  CT surgery  Procedures:   Cardiac cath, triple-vessel disease  Antimicrobials:   None   Subjective: Patient seen and examined.  I was called by the bedside nurse that patient is severely anxious and unable to control symptoms.  Came back from cath.  Wife was at the bedside. Patient was counseled.  He feels that his anxiety is acting up and needs some Xanax. No delusion or hallucinations.  Denies any chest pain.  Objective: Vitals:   11/24/19 0925 11/24/19 0930 11/24/19 0950 11/24/19 1010  BP: (!) 127/92 113/86 (!) 110/94 (!) 113/93  Pulse: 99 100 97 95  Resp: 18 10 15  (!) 21  Temp:    (!) 97.4 F (36.3 C)  TempSrc:    Axillary  SpO2: 96% 99% 94% 99%  Weight:      Height:        Intake/Output Summary (Last 24 hours) at 11/24/2019 1301 Last data filed at 11/24/2019 0500 Gross per 24 hour  Intake 240 ml  Output 1125 ml  Net -885 ml   Filed Weights   11/22/19 0500  11/23/19 1836 11/24/19 0614  Weight: 88.1 kg 86.9 kg 85.3 kg    Examination:  General exam: Appears anxious, restless, oxygen saturation adequate on 1 to 2 L oxygen. Respiratory system: Clear to auscultation. Respiratory effort normal. Cardiovascular system: S1 & S2 heard, RRR. No JVD, murmurs, rubs, gallops or clicks. No pedal edema. Gastrointestinal system: Abdomen is nondistended, soft and nontender. No  organomegaly or masses felt. Normal bowel sounds heard. Central nervous system: Alert and oriented. No focal neurological deficits. Extremities: Symmetric 5 x 5 power. Skin: No rashes, lesions or ulcers Psychiatry: Judgement and insight appear normal. Mood & affect anxious    Data Reviewed: I have personally reviewed following labs and imaging studies  CBC: Recent Labs  Lab 11/20/19 0937 11/21/19 0513 11/22/19 0414 11/23/19 0453  WBC 10.3 14.5* 11.4* 9.6  NEUTROABS 7.2 10.1* 7.6 6.2  HGB 15.4 16.9 15.9 15.6  HCT 49.1 53.8* 48.1 48.6  MCV 90.3 90.3 87.5 88.5  PLT 230 277 268 768   Basic Metabolic Panel: Recent Labs  Lab 11/20/19 0937 11/21/19 0513 11/22/19 0414 11/23/19 0453 11/24/19 0439  NA 132* 134* 138 136 137  K 4.5 4.7 3.6 3.9 4.2  CL 96* 101 103 103 104  CO2 24 19* 23 25 22   GLUCOSE 407* 181* 65* 222* 181*  BUN 16 30* 43* 40* 33*  CREATININE 0.94 1.04 0.90 1.01 1.11  CALCIUM 8.6* 8.4* 8.7* 8.3* 8.5*  MG  --  2.4 2.2 2.2  --    GFR: Estimated Creatinine Clearance: 74.9 mL/min (by C-G formula based on SCr of 1.11 mg/dL). Liver Function Tests: Recent Labs  Lab 11/20/19 0937  AST 28  ALT 48*  ALKPHOS 92  BILITOT 0.6  PROT 6.2*  ALBUMIN 3.3*   Recent Labs  Lab 11/20/19 0937  LIPASE 30   No results for input(s): AMMONIA in the last 168 hours. Coagulation Profile: No results for input(s): INR, PROTIME in the last 168 hours. Cardiac Enzymes: No results for input(s): CKTOTAL, CKMB, CKMBINDEX, TROPONINI in the last 168 hours. BNP (last 3 results) No results for input(s): PROBNP in the last 8760 hours. HbA1C: No results for input(s): HGBA1C in the last 72 hours. CBG: Recent Labs  Lab 11/23/19 1641 11/23/19 2133 11/24/19 0340 11/24/19 0557 11/24/19 1148  GLUCAP 276* 163* 173* 152* 202*   Lipid Profile: No results for input(s): CHOL, HDL, LDLCALC, TRIG, CHOLHDL, LDLDIRECT in the last 72 hours. Thyroid Function Tests: No results for input(s):  TSH, T4TOTAL, FREET4, T3FREE, THYROIDAB in the last 72 hours. Anemia Panel: No results for input(s): VITAMINB12, FOLATE, FERRITIN, TIBC, IRON, RETICCTPCT in the last 72 hours. Sepsis Labs: No results for input(s): PROCALCITON, LATICACIDVEN in the last 168 hours.  Recent Results (from the past 240 hour(s))  SARS CORONAVIRUS 2 (TAT 6-24 HRS) Nasopharyngeal Nasopharyngeal Swab     Status: None   Collection Time: 11/20/19  1:06 PM   Specimen: Nasopharyngeal Swab  Result Value Ref Range Status   SARS Coronavirus 2 NEGATIVE NEGATIVE Final    Comment: (NOTE) SARS-CoV-2 target nucleic acids are NOT DETECTED. The SARS-CoV-2 RNA is generally detectable in upper and lower respiratory specimens during the acute phase of infection. Negative results do not preclude SARS-CoV-2 infection, do not rule out co-infections with other pathogens, and should not be used as the sole basis for treatment or other patient management decisions. Negative results must be combined with clinical observations, patient history, and epidemiological information. The expected result is Negative. Fact Sheet for Patients: SugarRoll.be  Fact Sheet for Healthcare Providers: quierodirigir.comhttps://www.fda.gov/media/138095/download This test is not yet approved or cleared by the [place] FDA and  has been authorized for detection and/or diagnosis of SARS-CoV-2 by FDA under an Emergency Use Authorization (EUA). This EUA will remain  in effect (meaning this test can be used) for the duration of the COVID-19 declaration under Section 56 4(b)(1) of the Act, 21 U.S.C. section 360bbb-3(b)(1), unless the authorization is terminated or revoked sooner. Performed at [facility], [address] [place], [place] [address]   MRSA PCR Screening     Status: None   Collection Time: 11/20/19  3:17 PM   Specimen: Nasopharyngeal Swab  Result Value Ref Range Status   MRSA by PCR NEGATIVE NEGATIVE Final     Comment:        The GeneXpert MRSA Assay (FDA approved for NASAL specimens only), is one component of a comprehensive MRSA colonization surveillance program. It is not intended to diagnose MRSA infection nor to guide or monitor treatment for MRSA infections. Performed at [facility], [address] [place], [place] [address]          Radiology Studies: CT HEAD WO CONTRAST  Result Date: 11/24/2019 CLINICAL DATA:  Head trauma, headache. Patient fell in room striking head. Red mark on forehead and laceration of the nose. On anticoagulation. EXAM: CT HEAD WITHOUT CONTRAST TECHNIQUE: Contiguous axial images were obtained from the base of the skull through the vertex without intravenous contrast. COMPARISON:  None. FINDINGS: Brain: No intracranial hemorrhage, mass effect, or midline shift. No hydrocephalus. The basilar cisterns are patent. No evidence of territorial infarct or acute ischemia. No extra-axial or intracranial fluid collection. Vascular: Atherosclerosis of skullbase vasculature without hyperdense vessel or abnormal calcification. Skull: No fracture or focal lesion. Sinuses/Orbits: Trace mucosal thickening of the maxillary and left frontal sinuses without fluid level or fracture. Orbits are unremarkable. Mastoid air cells are clear. Other: None. IMPRESSION: No acute intracranial abnormality. No skull fracture. Electronically Signed   By: Narda RutherfordMelanie  Sanford M.D.   On: 11/24/2019 01:20   CARDIAC CATHETERIZATION  Result Date: 11/24/2019 1. Low output HF with elevated filling pressures. 2. Severe 3 vessel disease with heavily calcified and severely stenotic proximal LAD.  I will increase IV Lasix and start him on milrinone 0.25 to promote diuresis. Diabetic with 3VD including proximal LAD. He will need cardiac surgery evaluation.  If not taken for surgery, PCI would be an option (LAD lesion would require atherectomy).   US EKG SITE RITE  Result Date: 11/24/2019 If Site Rite image not  attached, placement could not be confirmed due to current cardiac rhythm.       Scheduled Meds: . Chlorhexidine Gluconate Cloth  6 each Topical Q0600  . digoxin  0.125 mg Oral Daily  . furosemide  80 mg Intravenous BID  . gabapentin  600 mg Oral BID  . insulin aspart  0-15 Units Subcutaneous TID WC  . insulin aspart  0-5 Units Subcutaneous QHS  . insulin aspart  2 Units Subcutaneous TID WC  . insulin glargine  8 Units Subcutaneous Daily  . ipratropium-albuterol  3 mL Inhalation BID  . [START ON 11/25/2019] metoprolol succinate  25 mg Oral Daily  . mometasone-formoterol  2 puff Inhalation BID  . potassium chloride  10 mEq Oral BID  . sodium chloride flush  3 mL Intravenous Q12H  . sodium chloride flush  3 mL Intravenous Q12H  . spironolactone  12.5 mg Oral Daily   Continuous Infusions: .  sodium chloride    . amiodarone 60 mg/hr (11/24/19 1137)   Followed by  . amiodarone    . heparin    . milrinone 0.25 mcg/kg/min (11/24/19 1212)     LOS: 4 days    Time spent: 35 minutes    Dorcas Carrow, MD [facility] Pager [phone]

## 2019-11-24 NOTE — Consult Note (Signed)
PULMONARY / CRITICAL CARE MEDICINE   NAME:  Kevin Underwood, MRN:  [id number], DOB:  1961/05/25, LOS: 4 ADMISSION DATE:  11/20/2019, CONSULTATION DATE:  11/24/2019  REFERRING MD:  Aundra Dubin, MD, CHIEF COMPLAINT: Respiratory distress requiring BiPAP  BRIEF HISTORY:    58 year old smoker, diabetic, hypertensive admitted 12/12 for atrial fibrillation and congestive heart failure, found to have severely reduced EF less than 20% with global hypokinesis.  Cardiac-cath showed severe three-vessel disease.  He developed cardiogenic shock and respiratory distress requiring BiPAP   HISTORY OF PRESENT ILLNESS   Recent admission 1 week ago to [facility] for "pneumonia" Admitted to [facility] 12/12 for shortness of breath, leg edema and found to be in A. fib/RVR and heart failure.  Troponins were negative.  Echo showed severely reduced EF less than 20% with global hypokinesis .  He was transferred to [facility] and started on medical therapy but blood pressure was soft.  Apneas noted during hospital stay. He underwent cardiac cath on 12/16 which showed severe three-vessel disease He was started on milrinone but developed progressive hypoxia and worsening mental status, placed on BiPAP and PCCM was consulted. T CTS also consulted for placement of Impella and CABG   SIGNIFICANT PAST MEDICAL HISTORY    Past Medical History:  Diagnosis Date  . Arthritis   . COPD (chronic obstructive pulmonary disease) ([place])   . Essential hypertension   . Type 2 diabetes mellitus ([place])      SIGNIFICANT EVENTS:  12/16 R/LHC 12/16 to OR for impella  STUDIES:   12/12 CT angiogram negative for PE 12/13 echo EF less than 20%, global hypokinesis, RVSP 50 12/16 R/LHC >> severe three-vessel disease with proximal LAD stenosis   CULTURES:    ANTIBIOTICS:    LINES/TUBES:  12/16 RUE PICC >>  CONSULTANTS:  TCTS CHF SUBJECTIVE:  Poor mental status on BiPAP full facemask  CONSTITUTIONAL: BP 111/86   Pulse 85   Temp (!)  97.4 F (36.3 C) (Axillary)   Resp (!) 22   Ht 5\' 10"  (1.778 m)   Wt 85.3 kg   SpO2 93%   BMI 26.98 kg/m   I/O last 3 completed shifts: In: 977 [P.O.:977] Out: 2100 [Urine:2100]     FiO2 (%):  [21 %] 21 %  PHYSICAL EXAM: General: Well-built well-nourished, no distress on BiPAP Neuro: Opens eyes to name, follows one-step commands, lethargic HEENT: Mild pallor, no JVD, no icterus Cardiovascular: S1-S2 distant, tacky Lungs: Decreased breath sounds bilateral, no rhonchi Abdomen: Soft nontender Musculoskeletal: No deformity, cool extremities Skin: No rash  RESOLVED PROBLEM LIST   ASSESSMENT AND PLAN   Cardiogenic shock, severe ischemic cardiomyopathy -plan is for placement of Impella emergently and CABG eventually -Continue milrinone  Atrial fibrillation rate controlled-continue IV heparin, hold Eliquis for now  Acute hypoxic respiratory failure-related to above, currently on BiPAP, will be transported to the OR and intubated. We will likely come back from the OR after placement of Impella and intubated. We will place vent and sedation orders  SUMMARY OF TODAY'S PLAN:  To OR emergently  Best Practice / Goals of Care / Disposition.   DVT PROPHYLAXIS: IV heparin SUP: Protonix NUTRITION: N.p.o. MOBILITY: Bedrest GOALS OF CARE: Pending FAMILY DISCUSSIONS: Wife updated DISPOSITION ICU  LABS  Glucose Recent Labs  Lab 11/23/19 1102 11/23/19 1641 11/23/19 2133 11/24/19 0340 11/24/19 0557 11/24/19 1148  GLUCAP 265* 276* 163* [place]  Lab 11/22/19 7829 11/23/19 0453 11/24/19 0439 11/24/19 5621 11/24/19 3086  NA 138 136 137 141 140  K 3.6 3.9 4.2 3.5 3.6  CL 103 103 104  --   --   CO2 23 25 22   --   --   BUN 43* 40* 33*  --   --   CREATININE 0.90 1.01 1.11  --   --   GLUCOSE 65* 222* 181*  --   --     Liver Enzymes Recent Labs  Lab 11/20/19 0937  AST 28  ALT 48*  ALKPHOS 92  BILITOT 0.6  ALBUMIN 3.3*     Electrolytes Recent Labs  Lab 11/21/19 0513 11/22/19 0414 11/23/19 0453 11/24/19 0439  CALCIUM 8.4* 8.7* 8.3* 8.5*  MG 2.4 2.2 2.2  --     CBC Recent Labs  Lab 11/21/19 0513 11/22/19 0414 11/23/19 0453 11/24/19 0907 11/24/19 0912  WBC 14.5* 11.4* 9.6  --   --   HGB 16.9 15.9 15.6 16.3 16.0  HCT 53.8* 48.1 48.6 48.0 47.0  PLT 277 268 242  --   --     ABG Recent Labs  Lab 11/24/19 1543  PHART 7.484*  PCO2ART 27.7*  PO2ART 111*    Coag's No results for input(s): APTT, INR in the last 168 hours.  Sepsis Markers No results for input(s): LATICACIDVEN, PROCALCITON, O2SATVEN in the last 168 hours.  Cardiac Enzymes No results for input(s): TROPONINI, PROBNP in the last 168 hours.  PAST MEDICAL HISTORY :   He  has a past medical history of Arthritis, COPD (chronic obstructive pulmonary disease) (HCC), Essential hypertension, and Type 2 diabetes mellitus (HCC).  PAST SURGICAL HISTORY:  He  has a past surgical history that includes Back surgery; Foot surgery; Hand surgery; and RIGHT/LEFT HEART CATH AND CORONARY ANGIOGRAPHY (N/A, 11/24/2019).  No Known Allergies  No current facility-administered medications on file prior to encounter.   Current Outpatient Medications on File Prior to Encounter  Medication Sig  . aspirin 81 MG tablet Take 81 mg by mouth daily.  . COMBIVENT RESPIMAT 20-100 MCG/ACT AERS respimat Inhale 2 Inhalers into the lungs as needed.  . gabapentin (NEURONTIN) 600 MG tablet Take 1 tablet by mouth every 12 (twelve) hours.  Marland Kitchen glimepiride (AMARYL) 2 MG tablet Take 2 mg by mouth daily. Reported on 11/28/2015  . INVOKAMET XR [id number] MG TB24 Take 2 tablets by mouth daily.  Marland Kitchen lisinopril (PRINIVIL,ZESTRIL) 20 MG tablet Take 20 mg by mouth daily.  . SYMBICORT 160-4.5 MCG/ACT inhaler Inhale 2 puffs into the lungs 2 (two) times daily.   Marland Kitchen VICTOZA 18 MG/3ML SOPN Inject 1.2 Units into the skin daily.  Marland Kitchen ADVAIR DISKUS 250-50 MCG/DOSE AEPB Inhale 2 puffs  into the lungs 2 (two) times daily.  Marland Kitchen albuterol (PROVENTIL) (2.5 MG/3ML) 0.083% nebulizer solution Inhale 1 mL into the lungs 4 (four) times daily as needed.  . INVOKANA 100 MG TABS tablet Take 100 mg by mouth daily.  . metFORMIN (GLUCOPHAGE) 500 MG tablet Take 1,000 mg by mouth 2 (two) times daily with a meal.    FAMILY HISTORY:   His family history includes Arthritis in an other family member; Asthma in an other family member; Diabetes in an other family member; Heart disease in his sister; Lung disease in an other family member.  SOCIAL HISTORY:  He  reports that he has been smoking cigarettes. He has a 30.00 pack-year smoking history. He has never used smokeless tobacco. He reports current alcohol use. He reports that he does not use drugs.  REVIEW OF SYSTEMS:  Unable due to altered sensorium   Cyril Mourning MD. FCCP. [facility]  If no response to pager , please call [phone]   11/24/2019

## 2019-11-24 NOTE — Anesthesia Procedure Notes (Signed)
Arterial Line Insertion Start/End12/16/2020 4:50 PM, 11/24/2019 4:55 PM Performed by: Babs Bertin, CRNA, CRNA  Preanesthetic checklist: monitors and equipment checked Lidocaine 1% used for infiltration Left, radial was placed Catheter size: 20 G Maximum sterile barriers used   Attempts: 1 Procedure performed without using ultrasound guided technique. Ultrasound Notes:anatomy identified

## 2019-11-24 NOTE — Brief Op Note (Signed)
11/24/2019  7:34 PM  PATIENT:  Kevin Underwood  58 y.o. male  PRE-OPERATIVE DIAGNOSIS:  ISCHEMIC CARDIOMYOPATHY  POST-OPERATIVE DIAGNOSIS:  SAME  PROCEDURE:  Procedure(s): PLACEMENT OF IMPELLA 5.5 LEFT VENTRICULAR ASSIST DEVICE (N/A) TRANSESOPHAGEAL ECHOCARDIOGRAM (TEE) (N/A)  SURGEON:  Surgeon(s) and Role:    * Wonda Olds, MD - Primary  PHYSICIAN ASSISTANT: N/A  ASSISTANTS: STAFF   ANESTHESIA:   general  EBL:  100 mL   BLOOD ADMINISTERED:none  DRAINS: none   LOCAL MEDICATIONS USED:  NONE  SPECIMEN:  No Specimen  DISPOSITION OF SPECIMEN:  N/A  COUNTS:  YES  TOURNIQUET:  * No tourniquets in log *  DICTATION: .Note written in EPIC  PLAN OF CARE: Admit to inpatient   PATIENT DISPOSITION:  ICU - intubated and critically ill.   Delay start of Pharmacological VTE agent (>24hrs) due to surgical blood loss or risk of bleeding: no

## 2019-11-24 NOTE — Anesthesia Procedure Notes (Signed)
Procedure Name: Intubation Date/Time: 11/24/2019 4:51 PM Performed by: Clearnce Sorrel, CRNA Pre-anesthesia Checklist: Patient identified, Emergency Drugs available, Suction available and Patient being monitored Patient Re-evaluated:Patient Re-evaluated prior to induction Oxygen Delivery Method: Circle System Utilized Preoxygenation: Pre-oxygenation with 100% oxygen Induction Type: IV induction and Rapid sequence Laryngoscope Size: Mac and 4 Grade View: Grade I Tube type: Oral Tube size: 8.0 mm Number of attempts: 1 Airway Equipment and Method: Stylet and Oral airway Placement Confirmation: ETT inserted through vocal cords under direct vision,  positive ETCO2 and breath sounds checked- equal and bilateral Secured at: 23 cm Tube secured with: Tape Dental Injury: Teeth and Oropharynx as per pre-operative assessment

## 2019-11-24 NOTE — Interval H&P Note (Signed)
History and Physical Interval Note:  11/24/2019 8:48 AM  Kevin Underwood  has presented today for surgery, with the diagnosis of heart failure.  The various methods of treatment have been discussed with the patient and family. After consideration of risks, benefits and other options for treatment, the patient has consented to  Procedure(s): RIGHT/LEFT HEART CATH AND CORONARY ANGIOGRAPHY (N/A) as a surgical intervention.  The patient's history has been reviewed, patient examined, no change in status, stable for surgery.  I have reviewed the patient's chart and labs.  Questions were answered to the patient's satisfaction.     [name] [employer]

## 2019-11-24 NOTE — Progress Notes (Signed)
Peripherally Inserted Central Catheter/Midline Placement  The IV Nurse has discussed with the patient and/or persons authorized to consent for the patient, the purpose of this procedure and the potential benefits and risks involved with this procedure.  The benefits include less needle sticks, lab draws from the catheter, and the patient may be discharged home with the catheter. Risks include, but not limited to, infection, bleeding, blood clot (thrombus formation), and puncture of an artery; nerve damage and irregular heartbeat and possibility to perform a PICC exchange if needed/ordered by physician.  Alternatives to this procedure were also discussed.  Bard Power PICC patient education guide, fact sheet on infection prevention and patient information card has been provided to patient /or left at bedside.    PICC/Midline Placement Documentation  PICC Double Lumen 11/24/19 PICC Right Brachial 41 cm 0 cm (Active)  Indication for Insertion or Continuance of Line Vasoactive infusions 11/24/19 1637  Exposed Catheter (cm) 0 cm 11/24/19 1637  Site Assessment Clean;Dry;Intact 11/24/19 1637  Lumen #1 Status Flushed;Blood return noted;Saline locked 11/24/19 1637  Lumen #2 Status Flushed;Blood return noted;Saline locked 11/24/19 1637  Dressing Type Transparent;Securing device 11/24/19 1637  Dressing Status Clean;Dry;Intact;Antimicrobial disc in place 11/24/19 1637  Dressing Change Due 12/01/19 11/24/19 1637       Frances Maywood 11/24/2019, 4:40 PM

## 2019-11-24 NOTE — Progress Notes (Signed)
  S/p R/LHC earlier today showing low output HF with elevated filling pressures and severe 3 vessel diabetic disease with heavily calcified and severely stenotic proximal LAD.  Awaiting surgical consultation.     Follow-up round to check pt status. On arrival, Pt w/ increased WOB and hypoxia w/ O2 sats in the 80s on RA. Improved w/ [place] 3L/min. NSR on tele. BP stable. Milrinone started via peripheral IV @ 0.25 mcg. Awaiting PICC. Order upgraded to Urgent. IV team notified. Received 80 IV Lasix at 1144. Discussed w/ Dr. Aundra Dubin. Will give a bolus of IV Lasix 40 mg x 1 followed by lasix gtt at 12 mg/hr. RN given update on plan. Will monitor closely

## 2019-11-24 NOTE — Anesthesia Procedure Notes (Signed)
Central Venous Catheter Insertion Performed by: Oleta Mouse, MD, anesthesiologist Start/End12/16/2020 5:00 PM, 11/24/2019 5:11 PM Patient location: Pre-op. Preanesthetic checklist: patient identified, IV checked, site marked, risks and benefits discussed, surgical consent, monitors and equipment checked, pre-op evaluation, timeout performed and anesthesia consent Hand hygiene performed  and maximum sterile barriers used  PA cath was placed.[place] type:thermodilution Procedure performed without using ultrasound guided technique. Attempts: 1 Patient tolerated the procedure well with no immediate complications.

## 2019-11-25 ENCOUNTER — Encounter: Payer: Self-pay | Admitting: *Deleted

## 2019-11-25 ENCOUNTER — Encounter (HOSPITAL_COMMUNITY): Payer: 59

## 2019-11-25 ENCOUNTER — Inpatient Hospital Stay (HOSPITAL_COMMUNITY): Payer: 59

## 2019-11-25 DIAGNOSIS — J9601 Acute respiratory failure with hypoxia: Secondary | ICD-10-CM

## 2019-11-25 DIAGNOSIS — Z95811 Presence of heart assist device: Secondary | ICD-10-CM

## 2019-11-25 LAB — ECHOCARDIOGRAM LIMITED
Height: 70 in
Weight: 3178.15 oz

## 2019-11-25 LAB — CBC
HCT: 44.9 % (ref 39.0–52.0)
Hemoglobin: 14.7 g/dL (ref 13.0–17.0)
MCH: 28.6 pg (ref 26.0–34.0)
MCHC: 32.7 g/dL (ref 30.0–36.0)
MCV: 87.4 fL (ref 80.0–100.0)
Platelets: 265 10*3/uL (ref 150–400)
RBC: 5.14 MIL/uL (ref 4.22–5.81)
RDW: 14.2 % (ref 11.5–15.5)
WBC: 11.9 10*3/uL — ABNORMAL HIGH (ref 4.0–10.5)
nRBC: 0 % (ref 0.0–0.2)

## 2019-11-25 LAB — BASIC METABOLIC PANEL
Anion gap: 10 (ref 5–15)
BUN: 27 mg/dL — ABNORMAL HIGH (ref 6–20)
CO2: 24 mmol/L (ref 22–32)
Calcium: 8 mg/dL — ABNORMAL LOW (ref 8.9–10.3)
Chloride: 102 mmol/L (ref 98–111)
Creatinine, Ser: 1.17 mg/dL (ref 0.61–1.24)
GFR calc Af Amer: >60 mL/min (ref >60)
GFR calc non Af Amer: >60 mL/min (ref >60)
Glucose, Bld: 227 mg/dL — ABNORMAL HIGH (ref 70–99)
Potassium: 4.3 mmol/L (ref 3.5–5.1)
Sodium: 136 mmol/L (ref 135–145)

## 2019-11-25 LAB — POCT I-STAT 7, (LYTES, BLD GAS, ICA,H+H)
Acid-Base Excess: 2 mmol/L (ref 0.0–2.0)
Bicarbonate: 25.5 mmol/L (ref 20.0–28.0)
Calcium, Ion: 1.16 mmol/L (ref 1.15–1.40)
HCT: 45 % (ref 39.0–52.0)
Hemoglobin: 15.3 g/dL (ref 13.0–17.0)
O2 Saturation: 100 %
Patient temperature: 35
Potassium: 3.7 mmol/L (ref 3.5–5.1)
Sodium: 136 mmol/L (ref 135–145)
TCO2: 27 mmol/L (ref 22–32)
pCO2 arterial: 33.1 mmHg (ref 32.0–48.0)
pH, Arterial: 7.487 — ABNORMAL HIGH (ref 7.350–7.450)
pO2, Arterial: 314 mmHg — ABNORMAL HIGH (ref 83.0–108.0)

## 2019-11-25 LAB — COOXEMETRY PANEL
Carboxyhemoglobin: 1.6 % — ABNORMAL HIGH (ref 0.5–1.5)
Carboxyhemoglobin: 1.4 % (ref 0.5–1.5)
Methemoglobin: 0.9 % (ref 0.0–1.5)
Methemoglobin: 0.9 % (ref 0.0–1.5)
O2 Saturation: 83.5 %
O2 Saturation: 53.9 %
Total hemoglobin: 14.5 g/dL (ref 12.0–16.0)
Total hemoglobin: 14.7 g/dL (ref 12.0–16.0)

## 2019-11-25 LAB — LACTIC ACID, PLASMA: Lactic Acid, Venous: 1.9 mmol/L (ref 0.5–1.9)

## 2019-11-25 LAB — LACTATE DEHYDROGENASE: LDH: 336 U/L — ABNORMAL HIGH (ref 98–192)

## 2019-11-25 LAB — GLUCOSE, CAPILLARY
Glucose-Capillary: 213 mg/dL — ABNORMAL HIGH (ref 70–99)
Glucose-Capillary: 213 mg/dL — ABNORMAL HIGH (ref 70–99)
Glucose-Capillary: 197 mg/dL — ABNORMAL HIGH (ref 70–99)
Glucose-Capillary: 140 mg/dL — ABNORMAL HIGH (ref 70–99)
Glucose-Capillary: 124 mg/dL — ABNORMAL HIGH (ref 70–99)
Glucose-Capillary: 269 mg/dL — ABNORMAL HIGH (ref 70–99)

## 2019-11-25 LAB — POCT ACTIVATED CLOTTING TIME
Activated Clotting Time: 120 seconds
Activated Clotting Time: 120 seconds
Activated Clotting Time: 125 seconds
Activated Clotting Time: 131 seconds
Activated Clotting Time: 131 seconds
Activated Clotting Time: 131 seconds
Activated Clotting Time: 136 seconds
Activated Clotting Time: 142 seconds

## 2019-11-25 LAB — HEPARIN LEVEL (UNFRACTIONATED)
Heparin Unfractionated: 0.53 IU/mL (ref 0.30–0.70)
Heparin Unfractionated: 1.18 IU/mL — ABNORMAL HIGH (ref 0.30–0.70)

## 2019-11-25 LAB — MAGNESIUM: Magnesium: 2.2 mg/dL (ref 1.7–2.4)

## 2019-11-25 LAB — APTT
aPTT: 108 seconds — ABNORMAL HIGH (ref 24–36)
aPTT: >200 seconds (ref 24–36)

## 2019-11-25 LAB — BRAIN NATRIURETIC PEPTIDE: B Natriuretic Peptide: 596.1 pg/mL — ABNORMAL HIGH (ref 0.0–100.0)

## 2019-11-25 MED ORDER — DIAZEPAM 5 MG PO TABS
5.0000 mg | ORAL_TABLET | Freq: Four times a day (QID) | ORAL | Status: DC | PRN
  Administered 2019-11-26: 5 mg via ORAL
  Filled 2019-11-25: qty 1

## 2019-11-25 MED ORDER — DEXMEDETOMIDINE HCL IN NACL 400 MCG/100ML IV SOLN
0.1000 ug/kg/h | INTRAVENOUS | Status: DC
  Filled 2019-11-25: qty 100

## 2019-11-25 MED ORDER — ORAL CARE MOUTH RINSE
15.0000 mL | OROMUCOSAL | Status: DC
  Administered 2019-11-25 (×3): 15 mL via OROMUCOSAL

## 2019-11-25 MED ORDER — FUROSEMIDE 10 MG/ML IJ SOLN
10.0000 mg/h | INTRAVENOUS | Status: DC
  Administered 2019-11-25: 10 mg/h via INTRAVENOUS
  Filled 2019-11-25: qty 25

## 2019-11-25 MED ORDER — PLASMA-LYTE 148 IV SOLN
INTRAVENOUS | Status: AC
  Administered 2019-11-26: 500 mL
  Filled 2019-11-25: qty 2.5

## 2019-11-25 MED ORDER — NOREPINEPHRINE 4 MG/250ML-% IV SOLN
0.0000 ug/min | INTRAVENOUS | Status: AC
  Administered 2019-11-26: 2 ug/min via INTRAVENOUS
  Filled 2019-11-25: qty 250

## 2019-11-25 MED ORDER — CHLORHEXIDINE GLUCONATE CLOTH 2 % EX PADS: 6.0000 | MEDICATED_PAD | Freq: Once | CUTANEOUS | Status: DC

## 2019-11-25 MED ORDER — MILRINONE LACTATE IN DEXTROSE 20-5 MG/100ML-% IV SOLN
0.3000 ug/kg/min | INTRAVENOUS | Status: DC
  Filled 2019-11-25: qty 100

## 2019-11-25 MED ORDER — NITROGLYCERIN IN D5W 200-5 MCG/ML-% IV SOLN
2.0000 ug/min | INTRAVENOUS | Status: DC
  Filled 2019-11-25: qty 250

## 2019-11-25 MED ORDER — HYDROMORPHONE HCL 1 MG/ML IJ SOLN
1.0000 mg | INTRAMUSCULAR | Status: DC | PRN
  Administered 2019-11-25 – 2019-11-26 (×2): 1 mg via INTRAVENOUS
  Filled 2019-11-25 (×2): qty 1

## 2019-11-25 MED ORDER — METOPROLOL TARTRATE 12.5 MG HALF TABLET
12.5000 mg | ORAL_TABLET | Freq: Once | ORAL | Status: AC
  Administered 2019-11-26: 06:00:00 12.5 mg via ORAL
  Filled 2019-11-25: qty 1

## 2019-11-25 MED ORDER — ORAL CARE MOUTH RINSE
15.0000 mL | Freq: Two times a day (BID) | OROMUCOSAL | Status: DC
  Administered 2019-11-25: 22:00:00 15 mL via OROMUCOSAL

## 2019-11-25 MED ORDER — MAGNESIUM SULFATE 50 % IJ SOLN
40.0000 meq | INTRAMUSCULAR | Status: DC
  Filled 2019-11-25: qty 9.85

## 2019-11-25 MED ORDER — SODIUM CHLORIDE 0.9 % IV SOLN
1.5000 g | INTRAVENOUS | Status: AC
  Administered 2019-11-26: 1.5 g via INTRAVENOUS
  Filled 2019-11-25: qty 1.5

## 2019-11-25 MED ORDER — INSULIN GLARGINE 100 UNIT/ML ~~LOC~~ SOLN
10.0000 [IU] | Freq: Every day | SUBCUTANEOUS | Status: DC
  Administered 2019-11-25: 10 [IU] via SUBCUTANEOUS
  Filled 2019-11-25 (×2): qty 0.1

## 2019-11-25 MED ORDER — PHENYLEPHRINE HCL-NACL 20-0.9 MG/250ML-% IV SOLN
30.0000 ug/min | INTRAVENOUS | Status: AC
  Administered 2019-11-26: 25 ug/min via INTRAVENOUS
  Filled 2019-11-25: qty 250

## 2019-11-25 MED ORDER — BISACODYL 5 MG PO TBEC: 5.0000 mg | DELAYED_RELEASE_TABLET | Freq: Once | ORAL | Status: DC

## 2019-11-25 MED ORDER — TRANEXAMIC ACID 1000 MG/10ML IV SOLN
1.5000 mg/kg/h | INTRAVENOUS | Status: AC
  Administered 2019-11-26: 1.5 mg/kg/h via INTRAVENOUS
  Filled 2019-11-25: qty 25

## 2019-11-25 MED ORDER — POTASSIUM CHLORIDE 2 MEQ/ML IV SOLN
80.0000 meq | INTRAVENOUS | Status: DC
  Filled 2019-11-25: qty 40

## 2019-11-25 MED ORDER — ROSUVASTATIN CALCIUM 20 MG PO TABS
40.0000 mg | ORAL_TABLET | Freq: Every day | ORAL | Status: DC
  Administered 2019-11-25: 40 mg via ORAL
  Filled 2019-11-25: qty 2

## 2019-11-25 MED ORDER — INSULIN REGULAR(HUMAN) IN NACL 100-0.9 UT/100ML-% IV SOLN
INTRAVENOUS | Status: AC
  Administered 2019-11-26: 6.5 [IU]/h via INTRAVENOUS
  Filled 2019-11-25: qty 100

## 2019-11-25 MED ORDER — CHLORHEXIDINE GLUCONATE 0.12 % MT SOLN
15.0000 mL | Freq: Once | OROMUCOSAL | Status: AC
  Administered 2019-11-26: 06:00:00 15 mL via OROMUCOSAL
  Filled 2019-11-25: qty 15

## 2019-11-25 MED ORDER — TRANEXAMIC ACID (OHS) PUMP PRIME SOLUTION
2.0000 mg/kg | INTRAVENOUS | Status: DC
  Filled 2019-11-25: qty 1.8

## 2019-11-25 MED ORDER — SODIUM CHLORIDE 0.9 % IV SOLN
750.0000 mg | INTRAVENOUS | Status: DC
  Filled 2019-11-25: qty 750

## 2019-11-25 MED ORDER — FENTANYL CITRATE (PF) 100 MCG/2ML IJ SOLN
INTRAMUSCULAR | Status: AC
  Filled 2019-11-25: qty 2

## 2019-11-25 MED ORDER — VANCOMYCIN HCL 10 G IV SOLR
1500.0000 mg | INTRAVENOUS | Status: AC
  Administered 2019-11-26: 1500 mg via INTRAVENOUS
  Filled 2019-11-25: qty 1500

## 2019-11-25 MED ORDER — FENTANYL CITRATE (PF) 100 MCG/2ML IJ SOLN
25.0000 ug | INTRAMUSCULAR | Status: DC | PRN
  Administered 2019-11-25: 25 ug via INTRAVENOUS
  Administered 2019-11-25 (×4): 50 ug via INTRAVENOUS
  Administered 2019-11-25: 15:00:00 25 ug via INTRAVENOUS
  Filled 2019-11-25 (×2): qty 2

## 2019-11-25 MED ORDER — FUROSEMIDE 10 MG/ML IJ SOLN
40.0000 mg | Freq: Two times a day (BID) | INTRAMUSCULAR | Status: DC
  Administered 2019-11-25 (×2): 40 mg via INTRAVENOUS
  Filled 2019-11-25 (×2): qty 4

## 2019-11-25 MED ORDER — TEMAZEPAM 15 MG PO CAPS
15.0000 mg | ORAL_CAPSULE | Freq: Once | ORAL | Status: AC | PRN
  Administered 2019-11-25: 15 mg via ORAL
  Filled 2019-11-25: qty 1

## 2019-11-25 MED ORDER — VANCOMYCIN HCL 1000 MG IV SOLR
INTRAVENOUS | Status: AC
  Administered 2019-11-26: 1000 mL
  Filled 2019-11-25: qty 1000

## 2019-11-25 MED ORDER — TRANEXAMIC ACID (OHS) BOLUS VIA INFUSION
15.0000 mg/kg | INTRAVENOUS | Status: AC
  Administered 2019-11-26: 1351.5 mg via INTRAVENOUS
  Filled 2019-11-25: qty 1352

## 2019-11-25 MED ORDER — EPINEPHRINE HCL 5 MG/250ML IV SOLN IN NS
0.0000 ug/min | INTRAVENOUS | Status: DC
  Filled 2019-11-25 (×2): qty 250

## 2019-11-25 MED ORDER — CHLORHEXIDINE GLUCONATE CLOTH 2 % EX PADS
6.0000 | MEDICATED_PAD | Freq: Once | CUTANEOUS | Status: AC
  Administered 2019-11-25: 6 via TOPICAL

## 2019-11-25 MED ORDER — SODIUM CHLORIDE 0.9 % IV SOLN
INTRAVENOUS | Status: DC
  Filled 2019-11-25: qty 30

## 2019-11-25 MED ORDER — CHLORHEXIDINE GLUCONATE 0.12% ORAL RINSE (MEDLINE KIT)
15.0000 mL | Freq: Two times a day (BID) | OROMUCOSAL | Status: DC
  Administered 2019-11-25: 08:00:00 15 mL via OROMUCOSAL

## 2019-11-25 MED ORDER — ROSUVASTATIN CALCIUM 20 MG PO TABS: 40.0000 mg | ORAL_TABLET | Freq: Every day | ORAL | Status: DC

## 2019-11-25 MED ORDER — MILRINONE LACTATE IN DEXTROSE 20-5 MG/100ML-% IV SOLN
0.3750 ug/kg/min | INTRAVENOUS | Status: DC
  Administered 2019-11-25 (×3): 0.375 ug/kg/min via INTRAVENOUS
  Filled 2019-11-25 (×3): qty 100

## 2019-11-25 NOTE — Progress Notes (Signed)
Pt has wound on top of nose that has scab over it but continues to bleed.  Pt has order for CPAP, at this time placing a CPAP mask over nose will cause the bleeding to increase.  Spoke with RN, about not placing pt on CPAP.  Pt is on [place] and vitals are stable. RT will continue to monitor patient for further needs.

## 2019-11-25 NOTE — Progress Notes (Signed)
[place] Progress Note Patient Name: Kevin Underwood DOB: [birth year] MRN: [id number]   Date of Service  11/25/2019  HPI/Events of Note  Agitation - Patient is currently on a Propofol IV infusion.   eICU Interventions  Will order: 1. Fentanyl 25-100 mcg IV Q 2 hours PRN agitation or pain.      Intervention Category Major Interventions: Delirium, psychosis, severe agitation - evaluation and management  Tayja Manzer [place] 11/25/2019, 1:07 AM

## 2019-11-25 NOTE — Progress Notes (Addendum)
Addendum: Despite systemic heparin reduction to 1700 units/hr - ACT is still rising at 180.  Purge providing 11 ml/hr providing (550 units/hr).  Minor bleeding noted as before. Pain in arm area.  Plan:  Will reduce systemic IV Heparin further to 1200 units/hr.  (Total with purge of 1750 units/hr of heparin) F/u AM levels as previously ordered.  RN to alert pharmacy if ACT concerning as well.   Sloan Leiter, PharmD, BCPS, BCCCP Clinical Pharmacist Please refer to [facility] for [facility] Pharm[facility] numbers 11/25/2019, 9:39 PM    [place] for Heparin Indication: Impella  No Known Allergies  Patient Measurements: Height: 5\' 10"  (177.8 cm) Weight: 198 lb 10.2 oz (90.1 kg) IBW/kg (Calculated) : 73 Heparin Dosing Weight: 85.3 kg  Vital Signs: Temp: 99.1 F (37.3 C) (12/17 1900) Temp Source: Core (12/17 1600) BP: 114/87 (12/17 1900) Pulse Rate: 96 (12/17 1900)  Labs: Recent Labs    11/23/19 0453 11/23/19 0453 11/24/19 0439 11/24/19 2032 11/24/19 2114 11/25/19 0500 11/25/19 0741 11/25/19 1735  HGB 15.6   < >  --  15.3 14.6 14.7  --   --   HCT 48.6   < >  --  45.0 44.5 44.9  --   --   PLT 242  --   --   --  233 265  --   --   APTT  --   --   --   --  147*  --  108* >200*  HEPARINUNFRC  --   --   --   --  0.64  --  0.53 1.18*  CREATININE 1.01  --  1.11  --  1.04 1.17  --   --    < > = values in this interval not displayed.    Estimated Creatinine Clearance: 77.7 mL/min (by C-G formula based on SCr of 1.17 mg/dL).   Medical History: Past Medical History:  Diagnosis Date  . Arthritis   . COPD (chronic obstructive pulmonary disease) ([place])   . Essential hypertension   . Type 2 diabetes mellitus [facility])     Assessment: 58 year old male admitted with newly documented cardiomyopathy with paroxysmal atrial fibrillation with RVR. Patient had progressively worsening hypoxia and mental status over the course of the day requiring emergent  Impella placement in the OR and intubation. Heparin being started post Impella surgery.   Hgb is stable at 14.7. ACTs have been low this morning, heparin being adjusted per protocol. Now approaching ACT goal currently on 1700 units/hr, last ACT was 158. Purge providing 515 units/hr for total of 2215 units/hr.   Heparin level is now significantly elevated at 1.18 and aPTT >200 (drawn from opposite arm than IV Heparin is infusing). ACT 175. Patient is having minor bleeding from Impella insertion site and nasal abrasion. Based on high heparin level, aPTT, and minor bleeding, will decrease heparin infusion for now - not holding. RN aware and making change.   Goal of Therapy:  Heparin level 0.3-0.7 Aptt 66-102s Target ACT: 160-180 seconds Monitor platelets by anticoagulation protocol: Yes   Plan:  Reduce systemic heparin to 1400 units/hr with elevated HL/aPTT and minor bleeding.  Will check heparin level and aPTT in AM.  Surgery planned for tomorrow AM.   Sloan Leiter, PharmD, BCPS, BCCCP Clinical Pharmacist Please refer to [facility] for [place] numbers 11/25/2019 7:15 PM

## 2019-11-25 NOTE — Procedures (Signed)
Extubation Procedure Note  Patient Details:   Name: FITZ MATSUO DOB: 1961/05/31 MRN: [id number]   Airway Documentation:   + cuff leak test prior to extubation Vent end date: 11/25/19 Vent end time: 1135   Evaluation  O2 sats: stable throughout Complications: No apparent complications Patient did tolerate procedure well. Bilateral Breath Sounds: Clear   Yes pt able to speak, no stridor noted, no distress noted.  VSS, sat 96% on 6 lpm [place]  Lenna Sciara 11/25/2019, 11:38 AM

## 2019-11-25 NOTE — Progress Notes (Addendum)
ANTICOAGULATION CONSULT NOTE  Pharmacy Consult for Heparin Indication: Impella  No Known Allergies  Patient Measurements: Height: 5\' 10"  (177.8 cm) Weight: 198 lb 10.2 oz (90.1 kg) IBW/kg (Calculated) : 73 Heparin Dosing Weight: 85.3 kg  Vital Signs: Temp: 99.5 F (37.5 C) (12/17 0700) Temp Source: Core (12/17 0400) BP: 106/89 (12/17 0700) Pulse Rate: 94 (12/17 0700)  Labs: Recent Labs    11/23/19 0453 11/24/19 0439 11/24/19 2032 11/24/19 2114 11/25/19 0500  HGB 15.6  --  15.3 14.6 14.7  HCT 48.6  --  45.0 44.5 44.9  PLT 242  --   --  233 265  APTT  --   --   --  147*  --   HEPARINUNFRC  --   --   --  0.64  --   CREATININE 1.01 1.11  --  1.04  --     Estimated Creatinine Clearance: 87.4 mL/min (by C-G formula based on SCr of 1.04 mg/dL).   Medical History: Past Medical History:  Diagnosis Date  . Arthritis   . COPD (chronic obstructive pulmonary disease) ([place])   . Essential hypertension   . Type 2 diabetes mellitus [facility])     Assessment: 58 year old male admitted with newly documented cardiomyopathy with paroxysmal atrial fibrillation with RVR. Patient had progressively worsening hypoxia and mental status over the course of the day requiring emergent Impella placement in the OR and intubation. Heparin being started post Impella surgery.   Hgb is stable at 14.7. ACTs have been low this morning, heparin being adjusted per protocol. Now approaching ACT goal currently on 1700 units/hr, last ACT was 158. Purge providing 515 units/hr for total of 2215 units/hr.   Heparin level this morning was therapeutic at 0.53 (did receive apixaban 12/14 pm), but aptt also within range at 108s this morning. Discussed with Rn, will place cap on systemic heparin range for now, some oozing noted but no overt bleeding. Will recheck coags this evening and if they continue to be in therapeutic range will continue current heparin dosing and go away from ACT nomogram.   Goal of Therapy:   Heparin level 0.3-0.7 Aptt 66-102s Target ACT: 160-180 seconds Monitor platelets by anticoagulation protocol: Yes   Plan:  Continue heparin at 1700 units/hr - would like to keep ACTs at least in the 150s, but will also check aptt and heparin level tonight -If heparin level and aptt therapeutic will continue current rate, if levels drop below therapeutic range will transition back to ACT nomogram  Erin Hearing PharmD., BCPS Clinical Pharmacist 11/25/2019 7:29 AM

## 2019-11-25 NOTE — Progress Notes (Deleted)
  See progress note by  Dr. McLean.   

## 2019-11-25 NOTE — Progress Notes (Signed)
Increased CVPs, Pt dusky, Increased BP, McLean aware.  CO 4.93 on P8. Good UOP from 40 mg lasix push.   Start Lasix gtt 10 mg.  Wean epi to off.

## 2019-11-25 NOTE — Progress Notes (Addendum)
Patient ID: Kevin Underwood, male   DOB: 1961/11/22, 58 y.o.   MRN: [id number]     Advanced Heart Failure Rounding Note  PCP-Cardiologist: Rozann Lesches, MD   Subjective:    - Impella 5.5 placed 12/16 dur to cardiogenic shock.   This morning, he is doing better.  He remains on norepinephrine 3, epinephrine 2, milrinone 0.375.  Creatinine stable, urine clear, LDH 336. Tm 100 with WBCs 11.9.   He has remained in NSR on amiodarone and heparin gtt.  Had some NSVT overnight.   Impella: Flow 4.4 L/min at P7, good waveforms.   [place]wan numbers: CVP 9 PA 38/20 CO 6 CI 3 SVR 707 Co-ox 83%  LHC/RHC: Coronary Findings  Diagnostic Dominance: Right Left Main  30% distal LM stenosis.  Left Anterior Descending  Heavily calcified proximal and mid LAD. There is 95% stenosis in the proximal LAD just after take-off of a high D1. D1 has moderate diffuse disease.  Left Circumflex  Long 80% mid LCx stenosis proximal to PLOM.  Right Coronary Artery  RCA with 50% proximal stenosis, 30% mid stenosis. PDA with 75% proximal stenosis.  Intervention  No interventions have been documented. Right Heart  Right Heart Pressures RHC Procedural Findings: Hemodynamics (mmHg) RA mean 13 RV 45/18 PA 53/22, mean 39 PCWP mean 24 LV 106/28 AO 99/66  Oxygen saturations: PA 53% AO 97%  Cardiac Output (Fick) 2.92  Cardiac Index (Fick) 1.42 PVR 5.1 WU PAPI 2.4      Objective:   Weight Range: 90.1 kg Body mass index is 28.5 kg/m.   Vital Signs:   Temp:  [94.6 F (34.8 C)-100 F (37.8 C)] 99.5 F (37.5 C) (12/17 0700) Pulse Rate:  [66-102] 94 (12/17 0700) Resp:  [10-22] 16 (12/17 0700) BP: (91-134)/(69-108) 106/89 (12/17 0700) SpO2:  [87 %-100 %] 96 % (12/17 0700) Arterial Line BP: (73-113)/(64-89) 90/70 (12/17 0700) FiO2 (%):  [50 %-80 %] 70 % (12/17 0420) Weight:  [90.1 kg] 90.1 kg (12/17 0410) Last BM Date: 11/23/19  Weight change: Filed Weights   11/23/19 1836 11/24/19 0614  11/25/19 0410  Weight: 86.9 kg 85.3 kg 90.1 kg    Intake/Output:   Intake/Output Summary (Last 24 hours) at 11/25/2019 0743 Last data filed at 11/25/2019 0700 Gross per 24 hour  Intake 2368.92 ml  Output 1380 ml  Net 988.92 ml      Physical Exam    General:  Intubated/sedated HEENT: Normal Neck: Supple. JVP 8 cm. Carotids 2+ bilat; no bruits. No lymphadenopathy or thyromegaly appreciated. Cor: PMI nondisplaced. Regular rate & rhythm. No rubs, gallops or murmurs. Lungs: Decreased at bases.  Abdomen: Soft, nontender, nondistended. No hepatosplenomegaly. No bruits or masses. Good bowel sounds. Extremities: No cyanosis, clubbing, rash. Trace ankle edema.  Neuro: Sedated.    Telemetry   NSR 70s (personally reviewed)  Labs    CBC Recent Labs    11/23/19 0453 11/24/19 2114 11/25/19 0500  WBC 9.6 9.8 11.9*  NEUTROABS 6.2  --   --   HGB 15.6 14.6 14.7  HCT 48.6 44.5 44.9  MCV 88.5 88.1 87.4  PLT 242 233 588   Basic Metabolic Panel Recent Labs    11/23/19 0453 11/24/19 2114 11/25/19 0500  NA 136 136 136  K 3.9 3.8 4.3  CL 103 104 102  CO2 _0 GLUCOSE 222* 278* 227*  BUN 40* 29* 27*  CREATININE 1.01 1.04 1.17  CALCIUM 8.3* 8.1* 8.0*  MG 2.2 1.7  --  Liver Function Tests Recent Labs    11/24/19 2114  AST 76*  ALT 111*  ALKPHOS 92  BILITOT 0.5  PROT 4.6*  ALBUMIN 2.6*   No results for input(s): LIPASE, AMYLASE in the last 72 hours. Cardiac Enzymes No results for input(s): CKTOTAL, CKMB, CKMBINDEX, TROPONINI in the last 72 hours.  BNP: BNP (last 3 results) Recent Labs    11/22/19 0414 11/23/19 0453 11/25/19 0500  BNP 578.0* 1,054.0* 596.1*    ProBNP (last 3 results) No results for input(s): PROBNP in the last 8760 hours.   D-Dimer No results for input(s): DDIMER in the last 72 hours. Hemoglobin A1C No results for input(s): HGBA1C in the last 72 hours. Fasting Lipid Panel Recent Labs    11/24/19 2114  TRIG 40   Thyroid  Function Tests No results for input(s): TSH, T4TOTAL, T3FREE, THYROIDAB in the last 72 hours.  Invalid input(s): FREET3  Other results:   Imaging    DG Chest 1 View  Result Date: 11/24/2019 CLINICAL DATA:  Congestive heart failure EXAM: CHEST  1 VIEW COMPARISON:  11/21/2019 FINDINGS: Endotracheal tube terminates 5.5 cm above the carina. Right IJ venous catheter terminates in the right main pulmonary artery. Right subclavian ICD. Enteric tube courses into the stomach. Mild pulmonary vascular congestion. Trace left pleural effusion. No pneumothorax. Mild cardiomegaly.  Thoracic aortic atherosclerosis. IMPRESSION: Endotracheal tube terminates 5.5 cm above the carina. Additional support apparatus as above. Pulmonary vascular congestion.  Trace left pleural effusion. Electronically Signed   By: Julian Hy M.D.   On: 11/24/2019 19:35   CARDIAC CATHETERIZATION  Result Date: 11/24/2019 1. Low output HF with elevated filling pressures. 2. Severe 3 vessel disease with heavily calcified and severely stenotic proximal LAD.  I will increase IV Lasix and start him on milrinone 0.25 to promote diuresis. Diabetic with 3VD including proximal LAD. He will need cardiac surgery evaluation.  If not taken for surgery, PCI would be an option (LAD lesion would require atherectomy).   DG Abd Portable 1V  Result Date: 11/24/2019 CLINICAL DATA:  Line placement EXAM: PORTABLE ABDOMEN - 1 VIEW COMPARISON:  None. FINDINGS: The enteric tube projects over the gastric body. There is an Impella device projecting of the patient's heart. The bowel gas pattern is nonobstructive and nonspecific. Bilateral pleural effusions are suspected. Orthopedic hardware is noted at the lower lumbar spine. IMPRESSION: Enteric tube projects over the gastric body. Electronically Signed   By: Constance Holster M.D.   On: 11/24/2019 23:31   VAS US CAROTID  Result Date: 11/24/2019 PREOPERATIVE VASCULAR EVALUATION  Indications:      Pre-CABG. Risk Factors:    Hypertension, Diabetes. Limitations:     CPAP, respiratory disturbance, patient movement, patient                  agitation Comparison       No prior studies. Study: Performing Technologist: Oliver Hum RVT  Examination Guidelines: A complete evaluation includes B-mode imaging, spectral Doppler, color Doppler, and power Doppler as needed of all accessible portions of each vessel. Bilateral testing is considered an integral part of a complete examination. Limited examinations for reoccurring indications may be performed as noted.  Right Carotid Findings: +----------+--------+--------+--------+-----------------------+--------+           PSV cm/sEDV cm/sStenosisDescribe               Comments +----------+--------+--------+--------+-----------------------+--------+ CCA Prox  29      6  smooth and homogeneous          +----------+--------+--------+--------+-----------------------+--------+ CCA Distal43      12              smooth and heterogenous         +----------+--------+--------+--------+-----------------------+--------+ ICA Prox  40      12              calcific                        +----------+--------+--------+--------+-----------------------+--------+ ICA Distal39      19                                     tortuous +----------+--------+--------+--------+-----------------------+--------+ ECA       27      5                                               +----------+--------+--------+--------+-----------------------+--------+ Portions of this table do not appear on this page. +----------+--------+-------+--------+------------+           PSV cm/sEDV cmsDescribeArm Pressure +----------+--------+-------+--------+------------+ Subclavian22                                  +----------+--------+-------+--------+------------+ +---------+--------+--+--------+-+---------+ VertebralPSV cm/s11EDV cm/s3Antegrade  +---------+--------+--+--------+-+---------+ Left Carotid Findings: +----------+--------+--------+--------+-----------------------+--------+           PSV cm/sEDV cm/sStenosisDescribe               Comments +----------+--------+--------+--------+-----------------------+--------+ CCA Prox  42      15              smooth and homogeneous          +----------+--------+--------+--------+-----------------------+--------+ CCA Distal31      11              smooth and heterogenous         +----------+--------+--------+--------+-----------------------+--------+ ICA Prox  48      12              calcific                        +----------+--------+--------+--------+-----------------------+--------+ ICA Distal35      11                                     tortuous +----------+--------+--------+--------+-----------------------+--------+ ECA       21      4                                               +----------+--------+--------+--------+-----------------------+--------+ +----------+--------+--------+--------+------------+ SubclavianPSV cm/sEDV cm/sDescribeArm Pressure +----------+--------+--------+--------+------------+           56                                   +----------+--------+--------+--------+------------+ +---------+--------+--+--------+--+---------+ VertebralPSV cm/s26EDV cm/s12Antegrade +---------+--------+--+--------+--+---------+  Summary: Right Carotid: Velocities in the right ICA are consistent with a 1-39% stenosis. Left Carotid: Velocities  in the left ICA are consistent with a 1-39% stenosis. Vertebrals: Bilateral vertebral arteries demonstrate antegrade flow.     Preliminary    [place] EKG SITE RITE  Result Date: 11/24/2019 If Site Rite image not attached, placement could not be confirmed due to current cardiac rhythm.  HYBRID OR IMAGING ([facility] ONLY)  Result Date: 11/24/2019 There is no interpretation for this exam.  This order is for images  obtained during a surgical procedure.  Please See "Surgeries" Tab for more information regarding the procedure.      Medications:     Scheduled Medications: . sodium chloride   Intravenous Once  . aspirin EC  81 mg Oral Daily  . chlorhexidine gluconate (MEDLINE KIT)  15 mL Mouth Rinse BID  . Chlorhexidine Gluconate Cloth  6 each Topical Q0600  . digoxin  0.125 mg Oral Daily  . furosemide  40 mg Intravenous BID  . gabapentin  600 mg Oral BID  . insulin aspart  0-15 Units Subcutaneous TID WC  . insulin aspart  0-5 Units Subcutaneous QHS  . insulin aspart  2 Units Subcutaneous TID WC  . insulin glargine  8 Units Subcutaneous Daily  . ipratropium-albuterol  3 mL Inhalation BID  . mouth rinse  15 mL Mouth Rinse 10 times per day  . metoprolol succinate  25 mg Oral Daily  . mometasone-formoterol  2 puff Inhalation BID  . potassium chloride  10 mEq Oral BID  . sodium chloride flush  10-40 mL Intracatheter Q12H  . sodium chloride flush  3 mL Intravenous Q12H  . sodium chloride flush  3 mL Intravenous Q12H  . spironolactone  12.5 mg Oral Daily     Infusions: . sodium chloride    . amiodarone 30 mg/hr (11/25/19 0700)  . epinephrine 2 mcg/min (11/25/19 0700)  . impella catheter heparin 50 unit/mL in dextrose 5%    . heparin 1,100 Units/hr (11/25/19 0700)  . milrinone 0.375 mcg/kg/min (11/25/19 0700)  . nitroGLYCERIN Stopped (11/24/19 2207)  . norepinephrine (LEVOPHED) Adult infusion 10 mcg/min (11/24/19 2200)  . propofol (DIPRIVAN) infusion 60 mcg/kg/min (11/25/19 0700)     PRN Medications:  sodium chloride, acetaminophen, ALPRAZolam, fentaNYL (SUBLIMAZE) injection, haloperidol lactate, levalbuterol, nicotine, ondansetron (ZOFRAN) IV, sodium chloride flush, sodium chloride flush    Assessment/Plan   1. Acute systolic CHF => cardiogenic shock:  Echo showed EF < 20% with mildly decreased RV systolic function. He was admitted with afib/RVR and CHF.  Cath with elevated filing  pressures, low CO (CI 1.4 by RHC), and severe 3 vessel coronary disease.  Ischemic cardiomyopathy, possibly contribution from tachycardia with previous atrial fibrillation. Impella 5.5 placed due to cardiogenic shock on 12/16.  This morning, on P7 with good flow.  Remains on milrinone 0.375, epinephrine 2, NE 3.  Stable MAP and good CO, SVR 707 this morning.  - Will aim to wean off epinephrine this morning.  - Lasix 40 mg IV bid for now, CVP 8-9 range (keep even to mildly negative).  - Continue digoxin 0.125. - Continue current Impella speed, will check position under echo this morning.  2. Atrial fibrillation: With RVR at admission, back in NSR now.   - Continue heparin gtt.  - Continue amiodarone gtt.  - Maze with CABG.  3. CAD: Severe 3VD in diabetic smoker.  He has exertional chest pain at baseline.  Was not admitted with ACS.  Anatomy best-suited to CABG.  - Continue ASA 81 daily.  - rosuvastatin 40 daily.   - TCTS  following for CABG when stabilized (hopefully relatively soon if we can get him extubated).  - If no CABG, could approach the LAD stenosis with atherectomy + PCI, other lesions with PCI.   4. 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  Suspect group 2 + group 3 (COPD). Diuresis will be main treatment.  5. Diabetes: Poor control.  Per IM, would eventually benefit from SGLT2 inhibitor.  6. OSA: Strongly suspected.  7. COPD: Suspected, former smoker.  Emphysema seen on CT chest.  8. FEN: Nutrition for tube feeds if not extubated today.  9. Acute hypoxemic respiratory failure: Think primarily pulmonary edema.  Intubated. CXR this morning looks good.  - Vent weaning today per CCM, hopefully extubate soon.  10. ID: Low grade fever to 100, mild rise in WBCs to 11.9, may be stress response.   CRITICAL CARE Performed by: Loralie Champagne  Total critical care time: 40 minutes  Critical care time was exclusive of separately billable procedures and treating  other patients.  Critical care was necessary to treat or prevent imminent or life-threatening deterioration.  Critical care was time spent personally by me on the following activities: development of treatment plan with patient and/or surrogate as well as nursing, discussions with consultants, evaluation of patient's response to treatment, examination of patient, obtaining history from patient or surrogate, ordering and performing treatments and interventions, ordering and review of laboratory studies, ordering and review of radiographic studies, pulse oximetry and re-evaluation of patient's condition.   Length of Stay: [place], MD  11/25/2019, 7:43 AM  Advanced Heart Failure Team Pager [phone] (M-F; 7a - 4p)  Please contact [facility] for night-coverage after hours (4p -7a ) and weekends on [email or web address]

## 2019-11-25 NOTE — Progress Notes (Signed)
PULMONARY / CRITICAL CARE MEDICINE   NAME:  Kevin Underwood, MRN:  [id number], DOB:  1961/06/08, LOS: 5 ADMISSION DATE:  11/20/2019, CONSULTATION DATE:  11/25/2019  REFERRING MD:  Aundra Dubin, MD, CHIEF COMPLAINT: Respiratory distress requiring BiPAP  BRIEF HISTORY:    58 year old smoker, diabetic, hypertensive admitted 12/12 for atrial fibrillation and congestive heart failure, found to have severely reduced EF less than 20% with global hypokinesis.  Cardiac-cath showed severe three-vessel disease.  He developed cardiogenic shock and respiratory distress requiring BiPAP 12/16 emergent placement of Impella LVAD    SIGNIFICANT PAST MEDICAL HISTORY    Past Medical History:  Diagnosis Date  . Arthritis   . COPD (chronic obstructive pulmonary disease) ([place])   . Essential hypertension   . Type 2 diabetes mellitus ([place])      SIGNIFICANT EVENTS:  12/16 R/LHC 12/16 to OR for impella  STUDIES:   12/12 CT angiogram negative for PE 12/13 echo EF less than 20%, global hypokinesis, RVSP 50 12/16 R/LHC >> severe three-vessel disease with proximal LAD stenosis   CULTURES:    ANTIBIOTICS:    LINES/TUBES:  12/16 RUE PICC >> 12/16 RIJ swan >>  CONSULTANTS:  TCTS CHF SUBJECTIVE:   Critically ill, intubated On epinephrine, Levophed, milrinone and heparin drips Propofol turned off this morning  CONSTITUTIONAL: BP (!) 84/64   Pulse 89   Temp 98.1 F (36.7 C)   Resp 15   Ht 5\' 10"  (1.778 m)   Wt 90.1 kg   SpO2 96%   BMI 28.50 kg/m   I/O last 3 completed shifts: In: 2608.9 [P.O.:240; I.V.:2131.6; Other:132.5; IV Piggyback:104.9] Out: 2505 [Urine:2405; Blood:100]  PAP: (32-45)/(15-26) 42/23 CVP:  [5 mmHg-17 mmHg] 11 mmHg CO:  [2.4 L/min-6 L/min] 5.1 L/min CI:  [1.2 L/min/m2-3 L/min/m2] 2.5 L/min/m2  Vent Mode: PSV;CPAP FiO2 (%):  [50 %-80 %] 50 % Set Rate:  [16 bmp] 16 bmp Vt Set:  [580 mL] 580 mL PEEP:  [5 cmH20] 5 cmH20 Pressure Support:  [10 cmH20] 10 cmH20 Plateau  Pressure:  [15 cmH20-20 cmH20] 20 cmH20  PHYSICAL EXAM: General: Well-built well-nourished, no distress , intubated Neuro:  eyes open, follows one-step commands, off sedation, RASS 0 HEENT: Mild pallor, no JVD, no icterus Cardiovascular: S1-S2 distant, tacky, Impella entry site with some bleeding Lungs: Decreased breath sounds bilateral, no rhonchi Abdomen: Soft nontender Musculoskeletal: No deformity, cool extremities Skin: No rash   Chest x-ray 12/17 personally reviewed ET tube and other hardware in position,?  Left lower lobe atelectasis/infiltrate  RESOLVED PROBLEM LIST   ASSESSMENT AND PLAN   Cardiogenic shock, severe ischemic cardiomyopathy - - Impella at P7 -Continue milrinone -Epinephrine and Levophed drips being titrated -Plan is for CABG 12/18  Atrial fibrillation rate controlled-continue IV heparin  Acute hypoxic respiratory failure-spontaneous breathing trials with goal extubation  Uncontrolled diabetes-missed dose of Lantus yesterday, increased to 10 units today  SUMMARY OF TODAY'S PLAN:  To OR emergently  Best Practice / Goals of Care / Disposition.   DVT PROPHYLAXIS: IV heparin SUP: Protonix NUTRITION: N.p.o. MOBILITY: Bedrest GOALS OF CARE: Aggressive care for now FAMILY DISCUSSIONS: Wife updated DISPOSITION ICU  The patient is critically ill with multiple organ systems failure and requires high complexity decision making for assessment and support, frequent evaluation and titration of therapies, application of advanced monitoring technologies and extensive interpretation of multiple databases. Critical Care Time devoted to patient care services described in this note independent of APP/resident  time is 32 minutes.     Kara Mead MD.  FCCP. [facility]  If no response to pager , please call [phone]   11/25/2019

## 2019-11-25 NOTE — Progress Notes (Signed)
  Echocardiogram 2D Echocardiogram has been performed.  Jennette Dubin 11/25/2019, 12:15 PM

## 2019-11-25 NOTE — Progress Notes (Signed)
Dr. Aundra Dubin aware of flows decreasing on impella from 4.1 down to 3.5. Pump adjusted from P7 to P8   RN will continue to monitor patient.

## 2019-11-26 ENCOUNTER — Inpatient Hospital Stay (HOSPITAL_COMMUNITY): Payer: 59

## 2019-11-26 ENCOUNTER — Inpatient Hospital Stay (HOSPITAL_COMMUNITY): Payer: 59 | Admitting: Certified Registered Nurse Anesthetist

## 2019-11-26 ENCOUNTER — Inpatient Hospital Stay (HOSPITAL_COMMUNITY)
Admission: EM | Disposition: A | Discharge status: Home / Self Care | Payer: Self-pay | Source: Home / Self Care | Attending: Cardiothoracic Surgery

## 2019-11-26 ENCOUNTER — Encounter (HOSPITAL_COMMUNITY): Payer: Self-pay | Admitting: Family Medicine

## 2019-11-26 DIAGNOSIS — Z951 Presence of aortocoronary bypass graft: Secondary | ICD-10-CM

## 2019-11-26 HISTORY — PX: CLIPPING OF ATRIAL APPENDAGE: SHX5773

## 2019-11-26 HISTORY — PX: TEE WITHOUT CARDIOVERSION: SHX5443

## 2019-11-26 HISTORY — PX: MAZE: SHX5063

## 2019-11-26 HISTORY — PX: CORONARY ARTERY BYPASS GRAFT: SHX141

## 2019-11-26 LAB — TRIGLYCERIDES: Triglycerides: 44 mg/dL (ref <150)

## 2019-11-26 LAB — POCT I-STAT 7, (LYTES, BLD GAS, ICA,H+H)
Acid-Base Excess: 4 mmol/L — ABNORMAL HIGH (ref 0.0–2.0)
Acid-Base Excess: 5 mmol/L — ABNORMAL HIGH (ref 0.0–2.0)
Bicarbonate: 26.3 mmol/L (ref 20.0–28.0)
Bicarbonate: 28.7 mmol/L — ABNORMAL HIGH (ref 20.0–28.0)
Bicarbonate: 31.3 mmol/L — ABNORMAL HIGH (ref 20.0–28.0)
Calcium, Ion: 0.93 mmol/L — ABNORMAL LOW (ref 1.15–1.40)
Calcium, Ion: 1.14 mmol/L — ABNORMAL LOW (ref 1.15–1.40)
Calcium, Ion: 1.16 mmol/L (ref 1.15–1.40)
HCT: 24 % — ABNORMAL LOW (ref 39.0–52.0)
HCT: 27 % — ABNORMAL LOW (ref 39.0–52.0)
HCT: 41 % (ref 39.0–52.0)
Hemoglobin: 13.9 g/dL (ref 13.0–17.0)
Hemoglobin: 8.2 g/dL — ABNORMAL LOW (ref 13.0–17.0)
Hemoglobin: 9.2 g/dL — ABNORMAL LOW (ref 13.0–17.0)
O2 Saturation: 100 %
O2 Saturation: 91 %
O2 Saturation: 99 %
Patient temperature: 36.1
Potassium: 3.6 mmol/L (ref 3.5–5.1)
Potassium: 3.7 mmol/L (ref 3.5–5.1)
Potassium: 3.9 mmol/L (ref 3.5–5.1)
Sodium: 135 mmol/L (ref 135–145)
Sodium: 136 mmol/L (ref 135–145)
Sodium: 137 mmol/L (ref 135–145)
TCO2: 28 mmol/L (ref 22–32)
TCO2: 30 mmol/L (ref 22–32)
TCO2: 33 mmol/L — ABNORMAL HIGH (ref 22–32)
pCO2 arterial: 44.8 mmHg (ref 32.0–48.0)
pCO2 arterial: 49 mmHg — ABNORMAL HIGH (ref 32.0–48.0)
pCO2 arterial: 50.1 mmHg — ABNORMAL HIGH (ref 32.0–48.0)
pH, Arterial: 7.329 — ABNORMAL LOW (ref 7.350–7.450)
pH, Arterial: 7.41 (ref 7.350–7.450)
pH, Arterial: 7.416 (ref 7.350–7.450)
pO2, Arterial: 168 mmHg — ABNORMAL HIGH (ref 83.0–108.0)
pO2, Arterial: 377 mmHg — ABNORMAL HIGH (ref 83.0–108.0)
pO2, Arterial: 58 mmHg — ABNORMAL LOW (ref 83.0–108.0)

## 2019-11-26 LAB — POCT I-STAT, CHEM 8
BUN: 16 mg/dL (ref 6–20)
BUN: 15 mg/dL (ref 6–20)
BUN: 17 mg/dL (ref 6–20)
BUN: 17 mg/dL (ref 6–20)
BUN: 17 mg/dL (ref 6–20)
BUN: 17 mg/dL (ref 6–20)
BUN: 18 mg/dL (ref 6–20)
Calcium, Ion: 1.1 mmol/L — ABNORMAL LOW (ref 1.15–1.40)
Calcium, Ion: 0.91 mmol/L — ABNORMAL LOW (ref 1.15–1.40)
Calcium, Ion: 1.09 mmol/L — ABNORMAL LOW (ref 1.15–1.40)
Calcium, Ion: 1 mmol/L — ABNORMAL LOW (ref 1.15–1.40)
Calcium, Ion: 1.02 mmol/L — ABNORMAL LOW (ref 1.15–1.40)
Calcium, Ion: 0.94 mmol/L — ABNORMAL LOW (ref 1.15–1.40)
Calcium, Ion: 1.14 mmol/L — ABNORMAL LOW (ref 1.15–1.40)
Chloride: 95 mmol/L — ABNORMAL LOW (ref 98–111)
Chloride: 95 mmol/L — ABNORMAL LOW (ref 98–111)
Chloride: 95 mmol/L — ABNORMAL LOW (ref 98–111)
Chloride: 97 mmol/L — ABNORMAL LOW (ref 98–111)
Chloride: 97 mmol/L — ABNORMAL LOW (ref 98–111)
Chloride: 98 mmol/L (ref 98–111)
Chloride: 100 mmol/L (ref 98–111)
Creatinine, Ser: 0.7 mg/dL (ref 0.61–1.24)
Creatinine, Ser: 0.7 mg/dL (ref 0.61–1.24)
Creatinine, Ser: 0.7 mg/dL (ref 0.61–1.24)
Creatinine, Ser: 0.8 mg/dL (ref 0.61–1.24)
Creatinine, Ser: 0.8 mg/dL (ref 0.61–1.24)
Creatinine, Ser: 0.8 mg/dL (ref 0.61–1.24)
Creatinine, Ser: 0.7 mg/dL (ref 0.61–1.24)
Glucose, Bld: 122 mg/dL — ABNORMAL HIGH (ref 70–99)
Glucose, Bld: 114 mg/dL — ABNORMAL HIGH (ref 70–99)
Glucose, Bld: 128 mg/dL — ABNORMAL HIGH (ref 70–99)
Glucose, Bld: 173 mg/dL — ABNORMAL HIGH (ref 70–99)
Glucose, Bld: 160 mg/dL — ABNORMAL HIGH (ref 70–99)
Glucose, Bld: 172 mg/dL — ABNORMAL HIGH (ref 70–99)
Glucose, Bld: 142 mg/dL — ABNORMAL HIGH (ref 70–99)
HCT: 34 % — ABNORMAL LOW (ref 39.0–52.0)
HCT: 27 % — ABNORMAL LOW (ref 39.0–52.0)
HCT: 31 % — ABNORMAL LOW (ref 39.0–52.0)
HCT: 22 % — ABNORMAL LOW (ref 39.0–52.0)
HCT: 22 % — ABNORMAL LOW (ref 39.0–52.0)
HCT: 25 % — ABNORMAL LOW (ref 39.0–52.0)
HCT: 30 % — ABNORMAL LOW (ref 39.0–52.0)
Hemoglobin: 11.6 g/dL — ABNORMAL LOW (ref 13.0–17.0)
Hemoglobin: 10.5 g/dL — ABNORMAL LOW (ref 13.0–17.0)
Hemoglobin: 9.2 g/dL — ABNORMAL LOW (ref 13.0–17.0)
Hemoglobin: 7.5 g/dL — ABNORMAL LOW (ref 13.0–17.0)
Hemoglobin: 7.5 g/dL — ABNORMAL LOW (ref 13.0–17.0)
Hemoglobin: 8.5 g/dL — ABNORMAL LOW (ref 13.0–17.0)
Hemoglobin: 10.2 g/dL — ABNORMAL LOW (ref 13.0–17.0)
Potassium: 3.5 mmol/L (ref 3.5–5.1)
Potassium: 3.5 mmol/L (ref 3.5–5.1)
Potassium: 3.6 mmol/L (ref 3.5–5.1)
Potassium: 4.5 mmol/L (ref 3.5–5.1)
Potassium: 4.4 mmol/L (ref 3.5–5.1)
Potassium: 4.7 mmol/L (ref 3.5–5.1)
Potassium: 3.9 mmol/L (ref 3.5–5.1)
Sodium: 134 mmol/L — ABNORMAL LOW (ref 135–145)
Sodium: 134 mmol/L — ABNORMAL LOW (ref 135–145)
Sodium: 135 mmol/L (ref 135–145)
Sodium: 134 mmol/L — ABNORMAL LOW (ref 135–145)
Sodium: 135 mmol/L (ref 135–145)
Sodium: 134 mmol/L — ABNORMAL LOW (ref 135–145)
Sodium: 137 mmol/L (ref 135–145)
TCO2: 27 mmol/L (ref 22–32)
TCO2: 29 mmol/L (ref 22–32)
TCO2: 30 mmol/L (ref 22–32)
TCO2: 31 mmol/L (ref 22–32)
TCO2: 27 mmol/L (ref 22–32)
TCO2: 27 mmol/L (ref 22–32)
TCO2: 26 mmol/L (ref 22–32)

## 2019-11-26 LAB — CBC
HCT: 39.1 % (ref 39.0–52.0)
HCT: 24.9 % — ABNORMAL LOW (ref 39.0–52.0)
HCT: 24.9 % — ABNORMAL LOW (ref 39.0–52.0)
Hemoglobin: 12.6 g/dL — ABNORMAL LOW (ref 13.0–17.0)
Hemoglobin: 8.2 g/dL — ABNORMAL LOW (ref 13.0–17.0)
Hemoglobin: 8.1 g/dL — ABNORMAL LOW (ref 13.0–17.0)
MCH: 28.5 pg (ref 26.0–34.0)
MCH: 28.8 pg (ref 26.0–34.0)
MCH: 27.7 pg (ref 26.0–34.0)
MCHC: 32.2 g/dL (ref 30.0–36.0)
MCHC: 32.9 g/dL (ref 30.0–36.0)
MCHC: 32.5 g/dL (ref 30.0–36.0)
MCV: 88.5 fL (ref 80.0–100.0)
MCV: 87.4 fL (ref 80.0–100.0)
MCV: 85.3 fL (ref 80.0–100.0)
Platelets: 142 10*3/uL — ABNORMAL LOW (ref 150–400)
Platelets: 99 10*3/uL — ABNORMAL LOW (ref 150–400)
Platelets: 89 10*3/uL — ABNORMAL LOW (ref 150–400)
RBC: 4.42 MIL/uL (ref 4.22–5.81)
RBC: 2.85 MIL/uL — ABNORMAL LOW (ref 4.22–5.81)
RBC: 2.92 MIL/uL — ABNORMAL LOW (ref 4.22–5.81)
RDW: 14.4 % (ref 11.5–15.5)
RDW: 15 % (ref 11.5–15.5)
RDW: 16.2 % — ABNORMAL HIGH (ref 11.5–15.5)
WBC: 10.5 10*3/uL (ref 4.0–10.5)
WBC: 11 10*3/uL — ABNORMAL HIGH (ref 4.0–10.5)
WBC: 7.2 10*3/uL (ref 4.0–10.5)
nRBC: 0 % (ref 0.0–0.2)
nRBC: 0 % (ref 0.0–0.2)
nRBC: 0 % (ref 0.0–0.2)

## 2019-11-26 LAB — GLUCOSE, CAPILLARY
Glucose-Capillary: 106 mg/dL — ABNORMAL HIGH (ref 70–99)
Glucose-Capillary: 77 mg/dL (ref 70–99)
Glucose-Capillary: 79 mg/dL (ref 70–99)
Glucose-Capillary: 72 mg/dL (ref 70–99)
Glucose-Capillary: 49 mg/dL — ABNORMAL LOW (ref 70–99)
Glucose-Capillary: 93 mg/dL (ref 70–99)
Glucose-Capillary: 114 mg/dL — ABNORMAL HIGH (ref 70–99)
Glucose-Capillary: 120 mg/dL — ABNORMAL HIGH (ref 70–99)
Glucose-Capillary: 125 mg/dL — ABNORMAL HIGH (ref 70–99)
Glucose-Capillary: 121 mg/dL — ABNORMAL HIGH (ref 70–99)

## 2019-11-26 LAB — BASIC METABOLIC PANEL
Anion gap: 8 (ref 5–15)
Anion gap: 8 (ref 5–15)
BUN: 22 mg/dL — ABNORMAL HIGH (ref 6–20)
BUN: 18 mg/dL (ref 6–20)
CO2: 26 mmol/L (ref 22–32)
CO2: 29 mmol/L (ref 22–32)
Calcium: 7.7 mg/dL — ABNORMAL LOW (ref 8.9–10.3)
Calcium: 7.8 mg/dL — ABNORMAL LOW (ref 8.9–10.3)
Chloride: 99 mmol/L (ref 98–111)
Chloride: 98 mmol/L (ref 98–111)
Creatinine, Ser: 0.97 mg/dL (ref 0.61–1.24)
Creatinine, Ser: 0.95 mg/dL (ref 0.61–1.24)
GFR calc Af Amer: >60 mL/min (ref >60)
GFR calc Af Amer: >60 mL/min (ref >60)
GFR calc non Af Amer: >60 mL/min (ref >60)
GFR calc non Af Amer: >60 mL/min (ref >60)
Glucose, Bld: 157 mg/dL — ABNORMAL HIGH (ref 70–99)
Glucose, Bld: 120 mg/dL — ABNORMAL HIGH (ref 70–99)
Potassium: 3.7 mmol/L (ref 3.5–5.1)
Potassium: 3.4 mmol/L — ABNORMAL LOW (ref 3.5–5.1)
Sodium: 133 mmol/L — ABNORMAL LOW (ref 135–145)
Sodium: 135 mmol/L (ref 135–145)

## 2019-11-26 LAB — COMPREHENSIVE METABOLIC PANEL
ALT: 31 U/L (ref 0–44)
AST: 50 U/L — ABNORMAL HIGH (ref 15–41)
Albumin: 2.8 g/dL — ABNORMAL LOW (ref 3.5–5.0)
Alkaline Phosphatase: 42 U/L (ref 38–126)
Anion gap: 8 (ref 5–15)
BUN: 16 mg/dL (ref 6–20)
CO2: 25 mmol/L (ref 22–32)
Calcium: 8.1 mg/dL — ABNORMAL LOW (ref 8.9–10.3)
Chloride: 105 mmol/L (ref 98–111)
Creatinine, Ser: 0.86 mg/dL (ref 0.61–1.24)
GFR calc Af Amer: >60 mL/min (ref >60)
GFR calc non Af Amer: >60 mL/min (ref >60)
Glucose, Bld: 136 mg/dL — ABNORMAL HIGH (ref 70–99)
Potassium: 4.4 mmol/L (ref 3.5–5.1)
Sodium: 138 mmol/L (ref 135–145)
Total Bilirubin: 1.3 mg/dL — ABNORMAL HIGH (ref 0.3–1.2)
Total Protein: 4.4 g/dL — ABNORMAL LOW (ref 6.5–8.1)

## 2019-11-26 LAB — POCT ACTIVATED CLOTTING TIME
Activated Clotting Time: 136 seconds
Activated Clotting Time: 142 seconds
Activated Clotting Time: 153 seconds
Activated Clotting Time: 158 seconds
Activated Clotting Time: 158 seconds
Activated Clotting Time: 158 seconds
Activated Clotting Time: 164 seconds
Activated Clotting Time: 164 seconds
Activated Clotting Time: 169 seconds
Activated Clotting Time: 175 seconds
Activated Clotting Time: 175 seconds
Activated Clotting Time: 175 seconds
Activated Clotting Time: 180 seconds

## 2019-11-26 LAB — URINALYSIS, ROUTINE W REFLEX MICROSCOPIC
Bilirubin Urine: NEGATIVE
Glucose, UA: NEGATIVE mg/dL
Ketones, ur: NEGATIVE mg/dL
Nitrite: NEGATIVE
Protein, ur: NEGATIVE mg/dL
Specific Gravity, Urine: 1.009 (ref 1.005–1.030)
pH: 5 (ref 5.0–8.0)

## 2019-11-26 LAB — PROTIME-INR
INR: 1.4 — ABNORMAL HIGH (ref 0.8–1.2)
Prothrombin Time: 17.3 seconds — ABNORMAL HIGH (ref 11.4–15.2)

## 2019-11-26 LAB — MAGNESIUM
Magnesium: 2 mg/dL (ref 1.7–2.4)
Magnesium: 2.5 mg/dL — ABNORMAL HIGH (ref 1.7–2.4)

## 2019-11-26 LAB — COOXEMETRY PANEL
Carboxyhemoglobin: 1.9 % — ABNORMAL HIGH (ref 0.5–1.5)
Methemoglobin: 1.1 % (ref 0.0–1.5)
O2 Saturation: 79.5 %
Total hemoglobin: 12.1 g/dL (ref 12.0–16.0)

## 2019-11-26 LAB — PREPARE RBC (CROSSMATCH)

## 2019-11-26 LAB — SURGICAL PCR SCREEN
MRSA, PCR: NEGATIVE
Staphylococcus aureus: NEGATIVE

## 2019-11-26 LAB — LACTATE DEHYDROGENASE: LDH: 314 U/L — ABNORMAL HIGH (ref 98–192)

## 2019-11-26 LAB — HEMOGLOBIN AND HEMATOCRIT, BLOOD
HCT: 23.5 % — ABNORMAL LOW (ref 39.0–52.0)
Hemoglobin: 7.6 g/dL — ABNORMAL LOW (ref 13.0–17.0)

## 2019-11-26 LAB — APTT
aPTT: 143 seconds — ABNORMAL HIGH (ref 24–36)
aPTT: 40 seconds — ABNORMAL HIGH (ref 24–36)

## 2019-11-26 LAB — PLATELET COUNT: Platelets: 81 10*3/uL — ABNORMAL LOW (ref 150–400)

## 2019-11-26 LAB — HEPARIN LEVEL (UNFRACTIONATED): Heparin Unfractionated: 1 IU/mL — ABNORMAL HIGH (ref 0.30–0.70)

## 2019-11-26 SURGERY — CORONARY ARTERY BYPASS GRAFTING (CABG)
Anesthesia: General | Site: Chest

## 2019-11-26 MED ORDER — PROPOFOL 1000 MG/100ML IV EMUL
25.0000 ug/kg/min | INTRAVENOUS | Status: DC
  Administered 2019-11-26 – 2019-11-27 (×4): 30 ug/kg/min via INTRAVENOUS
  Administered 2019-11-28: 35 ug/kg/min via INTRAVENOUS
  Administered 2019-11-28 (×2): 25 ug/kg/min via INTRAVENOUS
  Administered 2019-11-28: 30 ug/kg/min via INTRAVENOUS
  Administered 2019-11-29: 40 ug/kg/min via INTRAVENOUS
  Administered 2019-11-29: 30 ug/kg/min via INTRAVENOUS
  Filled 2019-11-26 (×5): qty 100
  Filled 2019-11-26: qty 200
  Filled 2019-11-26 (×3): qty 100

## 2019-11-26 MED ORDER — 0.9 % SODIUM CHLORIDE (POUR BTL) OPTIME
TOPICAL | Status: DC | PRN
  Administered 2019-11-26 (×2): 1000 mL

## 2019-11-26 MED ORDER — ASPIRIN EC 325 MG PO TBEC: 325.0000 mg | DELAYED_RELEASE_TABLET | Freq: Every day | ORAL | Status: DC

## 2019-11-26 MED ORDER — EPINEPHRINE HCL 5 MG/250ML IV SOLN IN NS
0.5000 ug/min | INTRAVENOUS | Status: DC
  Administered 2019-11-26 – 2019-11-29 (×6): 5 ug/min via INTRAVENOUS
  Filled 2019-11-26 (×4): qty 250

## 2019-11-26 MED ORDER — STERILE WATER FOR INJECTION IJ SOLN
INTRAMUSCULAR | Status: DC | PRN
  Administered 2019-11-26: 20 mL

## 2019-11-26 MED ORDER — LACTATED RINGERS IV SOLN: INTRAVENOUS | Status: DC

## 2019-11-26 MED ORDER — BUPIVACAINE LIPOSOME 1.3 % IJ SUSP
20.0000 mL | INTRAMUSCULAR | Status: DC
  Filled 2019-11-26: qty 20

## 2019-11-26 MED ORDER — SODIUM CHLORIDE 0.9% FLUSH: 3.0000 mL | INTRAVENOUS | Status: DC | PRN

## 2019-11-26 MED ORDER — METOPROLOL TARTRATE 5 MG/5ML IV SOLN: 2.5000 mg | INTRAVENOUS | Status: DC | PRN

## 2019-11-26 MED ORDER — POTASSIUM CHLORIDE 10 MEQ/50ML IV SOLN
INTRAVENOUS | Status: AC
  Administered 2019-11-26: 16:00:00 10 meq via INTRAVENOUS
  Filled 2019-11-26: qty 150

## 2019-11-26 MED ORDER — POTASSIUM CHLORIDE 10 MEQ/50ML IV SOLN
10.0000 meq | INTRAVENOUS | Status: DC
  Administered 2019-11-26: 10 meq via INTRAVENOUS
  Filled 2019-11-26: qty 50

## 2019-11-26 MED ORDER — DOPAMINE-DEXTROSE 3.2-5 MG/ML-% IV SOLN
2.5000 ug/kg/min | INTRAVENOUS | Status: DC
  Administered 2019-11-26 – 2019-11-29 (×2): 2.5 ug/kg/min via INTRAVENOUS
  Filled 2019-11-26: qty 250

## 2019-11-26 MED ORDER — SODIUM CHLORIDE 0.9 % IV SOLN
0.0000 ug/kg/min | INTRAVENOUS | Status: DC
  Administered 2019-11-26: 1 ug/kg/min via INTRAVENOUS
  Filled 2019-11-26 (×2): qty 20

## 2019-11-26 MED ORDER — DEXTROSE 50 % IV SOLN
INTRAVENOUS | Status: AC
  Filled 2019-11-26: qty 50

## 2019-11-26 MED ORDER — PROTAMINE SULFATE 10 MG/ML IV SOLN
INTRAVENOUS | Status: DC | PRN
  Administered 2019-11-26: 310 mg via INTRAVENOUS

## 2019-11-26 MED ORDER — PROPOFOL 10 MG/ML IV BOLUS
INTRAVENOUS | Status: AC
  Filled 2019-11-26: qty 20

## 2019-11-26 MED ORDER — VASOPRESSIN 20 UNIT/ML IV SOLN
0.0100 [IU]/min | INTRAVENOUS | Status: AC
  Administered 2019-11-26: .03 [IU]/min via INTRAVENOUS
  Filled 2019-11-26: qty 2

## 2019-11-26 MED ORDER — BUPIVACAINE HCL (PF) 0.5 % IJ SOLN
INTRAMUSCULAR | Status: AC
  Filled 2019-11-26: qty 30

## 2019-11-26 MED ORDER — BISACODYL 10 MG RE SUPP
10.0000 mg | Freq: Every day | RECTAL | Status: DC
  Administered 2019-11-27: 11:00:00 10 mg via RECTAL
  Filled 2019-11-26: qty 1

## 2019-11-26 MED ORDER — SODIUM CHLORIDE 0.9 % IV SOLN: INTRAVENOUS | Status: DC

## 2019-11-26 MED ORDER — VANCOMYCIN HCL 1000 MG IV SOLR
INTRAVENOUS | Status: DC | PRN
  Administered 2019-11-26: 3 g via TOPICAL

## 2019-11-26 MED ORDER — POTASSIUM CHLORIDE 10 MEQ/50ML IV SOLN
10.0000 meq | INTRAVENOUS | Status: AC
  Administered 2019-11-26 (×2): 10 meq via INTRAVENOUS

## 2019-11-26 MED ORDER — MAGNESIUM SULFATE 4 GM/100ML IV SOLN
4.0000 g | Freq: Once | INTRAVENOUS | Status: AC
  Administered 2019-11-26: 4 g via INTRAVENOUS
  Filled 2019-11-26: qty 100

## 2019-11-26 MED ORDER — SODIUM CHLORIDE 0.9 % IV SOLN
1.5000 g | Freq: Two times a day (BID) | INTRAVENOUS | Status: AC
  Administered 2019-11-26 – 2019-11-28 (×4): 1.5 g via INTRAVENOUS
  Filled 2019-11-26 (×5): qty 1.5

## 2019-11-26 MED ORDER — STERILE WATER FOR INJECTION IJ SOLN
INTRAMUSCULAR | Status: AC
  Filled 2019-11-26: qty 10

## 2019-11-26 MED ORDER — LACTATED RINGERS IV SOLN: INTRAVENOUS | Status: DC | PRN

## 2019-11-26 MED ORDER — SODIUM CHLORIDE 0.9 % IV SOLN
0.5000 ug/kg/min | INTRAVENOUS | Status: DC
  Filled 2019-11-26: qty 20

## 2019-11-26 MED ORDER — ARTIFICIAL TEARS OPHTHALMIC OINT
1.0000 "application " | TOPICAL_OINTMENT | Freq: Three times a day (TID) | OPHTHALMIC | Status: DC
  Administered 2019-11-26 – 2019-11-27 (×2): 1 via OPHTHALMIC
  Filled 2019-11-26: qty 3.5

## 2019-11-26 MED ORDER — FENTANYL 2500MCG IN NS 250ML (10MCG/ML) PREMIX INFUSION
50.0000 ug/h | INTRAVENOUS | Status: DC
  Administered 2019-11-26: 19:00:00 100 ug/h via INTRAVENOUS
  Administered 2019-11-27: 10:00:00 150 ug/h via INTRAVENOUS
  Administered 2019-11-28: 13:00:00 200 ug/h via INTRAVENOUS
  Administered 2019-11-28: 175 ug/h via INTRAVENOUS
  Administered 2019-11-29: 09:00:00 250 ug/h via INTRAVENOUS
  Administered 2019-11-29: 300 ug/h via INTRAVENOUS
  Filled 2019-11-26 (×6): qty 250

## 2019-11-26 MED ORDER — VANCOMYCIN HCL 1000 MG IV SOLR
INTRAVENOUS | Status: AC
  Filled 2019-11-26: qty 2000

## 2019-11-26 MED ORDER — ARTIFICIAL TEARS OPHTHALMIC OINT: TOPICAL_OINTMENT | Freq: Three times a day (TID) | OPHTHALMIC | Status: DC

## 2019-11-26 MED ORDER — CISATRACURIUM BOLUS VIA INFUSION
5.0000 mg | Freq: Once | INTRAVENOUS | Status: AC
  Administered 2019-11-26: 20:00:00 5 mg via INTRAVENOUS
  Filled 2019-11-26: qty 5

## 2019-11-26 MED ORDER — FAMOTIDINE IN NACL 20-0.9 MG/50ML-% IV SOLN
INTRAVENOUS | Status: AC
  Administered 2019-11-26: 20 mg via INTRAVENOUS
  Filled 2019-11-26: qty 50

## 2019-11-26 MED ORDER — METOPROLOL TARTRATE 25 MG/10 ML ORAL SUSPENSION: 12.5000 mg | Freq: Two times a day (BID) | ORAL | Status: DC

## 2019-11-26 MED ORDER — SODIUM CHLORIDE 0.9 % IV SOLN: INTRAVENOUS | Status: DC | PRN

## 2019-11-26 MED ORDER — SODIUM CHLORIDE 0.9% FLUSH
3.0000 mL | Freq: Two times a day (BID) | INTRAVENOUS | Status: DC
  Administered 2019-11-27 – 2019-12-05 (×9): 3 mL via INTRAVENOUS

## 2019-11-26 MED ORDER — PROTAMINE SULFATE 10 MG/ML IV SOLN
INTRAVENOUS | Status: AC
  Filled 2019-11-26: qty 25

## 2019-11-26 MED ORDER — EPINEPHRINE PF 1 MG/ML IJ SOLN: 0.0000 ug/min | INTRAVENOUS | Status: DC

## 2019-11-26 MED ORDER — BUPIVACAINE HCL (PF) 0.25 % IJ SOLN
INTRAMUSCULAR | Status: AC
  Filled 2019-11-26: qty 30

## 2019-11-26 MED ORDER — NOREPINEPHRINE 4 MG/250ML-% IV SOLN
0.0000 ug/min | INTRAVENOUS | Status: DC
  Administered 2019-11-26: 15:00:00 13 ug/min via INTRAVENOUS

## 2019-11-26 MED ORDER — ACETAMINOPHEN 650 MG RE SUPP
RECTAL | Status: AC
  Administered 2019-11-26: 17:00:00 650 mg via RECTAL
  Filled 2019-11-26: qty 1

## 2019-11-26 MED ORDER — PROPOFOL 1000 MG/100ML IV EMUL
5.0000 ug/kg/min | INTRAVENOUS | Status: DC
  Administered 2019-11-26: 10 ug/kg/min via INTRAVENOUS
  Filled 2019-11-26: qty 100

## 2019-11-26 MED ORDER — VASOPRESSIN 20 UNIT/ML IV SOLN
0.0400 [IU]/min | INTRAVENOUS | Status: DC
  Administered 2019-11-27: 0.04 [IU]/min via INTRAVENOUS
  Administered 2019-11-28: 0.02 [IU]/min via INTRAVENOUS
  Filled 2019-11-26 (×2): qty 2

## 2019-11-26 MED ORDER — AMIODARONE HCL IN DEXTROSE 360-4.14 MG/200ML-% IV SOLN
60.0000 mg/h | INTRAVENOUS | Status: DC
  Filled 2019-11-26: qty 200

## 2019-11-26 MED ORDER — ROCURONIUM BROMIDE 10 MG/ML (PF) SYRINGE
PREFILLED_SYRINGE | INTRAVENOUS | Status: DC | PRN
  Administered 2019-11-26: 50 mg via INTRAVENOUS
  Administered 2019-11-26: 30 mg via INTRAVENOUS
  Administered 2019-11-26 (×2): 50 mg via INTRAVENOUS
  Administered 2019-11-26: 70 mg via INTRAVENOUS
  Administered 2019-11-26: 50 mg via INTRAVENOUS

## 2019-11-26 MED ORDER — HEPARIN SODIUM (PORCINE) 1000 UNIT/ML IJ SOLN
INTRAMUSCULAR | Status: DC | PRN
  Administered 2019-11-26: 31000 [IU] via INTRAVENOUS

## 2019-11-26 MED ORDER — DEXTROSE 5 % SOLN FOR IMPELLA PURGE CATHETER
INTRAVENOUS | Status: AC
  Administered 2019-11-26: 19:00:00 500 mL
  Filled 2019-11-26: qty 1000

## 2019-11-26 MED ORDER — ACETAMINOPHEN 650 MG RE SUPP: 650.0000 mg | Freq: Once | RECTAL | Status: AC

## 2019-11-26 MED ORDER — DEXMEDETOMIDINE HCL IN NACL 200 MCG/50ML IV SOLN
INTRAVENOUS | Status: DC | PRN
  Administered 2019-11-26: .7 ug/kg/h via INTRAVENOUS

## 2019-11-26 MED ORDER — PHENYLEPHRINE HCL-NACL 20-0.9 MG/250ML-% IV SOLN: 0.0000 ug/min | INTRAVENOUS | Status: DC

## 2019-11-26 MED ORDER — MIDAZOLAM HCL (PF) 10 MG/2ML IJ SOLN
INTRAMUSCULAR | Status: AC
  Filled 2019-11-26: qty 2

## 2019-11-26 MED ORDER — ONDANSETRON HCL 4 MG/2ML IJ SOLN
4.0000 mg | Freq: Four times a day (QID) | INTRAMUSCULAR | Status: DC | PRN
  Administered 2019-12-02: 4 mg via INTRAVENOUS
  Filled 2019-11-26: qty 2

## 2019-11-26 MED ORDER — FENTANYL BOLUS VIA INFUSION
50.0000 ug | INTRAVENOUS | Status: DC | PRN
  Filled 2019-11-26: qty 50

## 2019-11-26 MED ORDER — MORPHINE SULFATE (PF) 2 MG/ML IV SOLN
1.0000 mg | INTRAVENOUS | Status: DC | PRN
  Administered 2019-11-30 – 2019-12-02 (×9): 2 mg via INTRAVENOUS
  Filled 2019-11-26 (×9): qty 1

## 2019-11-26 MED ORDER — EPHEDRINE 5 MG/ML INJ
INTRAVENOUS | Status: AC
  Filled 2019-11-26: qty 10

## 2019-11-26 MED ORDER — DEXMEDETOMIDINE HCL IN NACL 400 MCG/100ML IV SOLN
0.0000 ug/kg/h | INTRAVENOUS | Status: DC
  Administered 2019-11-26: 0.7 ug/kg/h via INTRAVENOUS
  Filled 2019-11-26: qty 100

## 2019-11-26 MED ORDER — ROCURONIUM BROMIDE 10 MG/ML (PF) SYRINGE
PREFILLED_SYRINGE | INTRAVENOUS | Status: AC
  Filled 2019-11-26: qty 10

## 2019-11-26 MED ORDER — HEMOSTATIC AGENTS (NO CHARGE) OPTIME
TOPICAL | Status: DC | PRN
  Administered 2019-11-26 (×3): 1 via TOPICAL

## 2019-11-26 MED ORDER — DOCUSATE SODIUM 100 MG PO CAPS: 200.0000 mg | ORAL_CAPSULE | Freq: Every day | ORAL | Status: DC

## 2019-11-26 MED ORDER — FENTANYL CITRATE (PF) 250 MCG/5ML IJ SOLN
INTRAMUSCULAR | Status: AC
  Filled 2019-11-26: qty 30

## 2019-11-26 MED ORDER — SUCCINYLCHOLINE CHLORIDE 200 MG/10ML IV SOSY
PREFILLED_SYRINGE | INTRAVENOUS | Status: AC
  Filled 2019-11-26: qty 10

## 2019-11-26 MED ORDER — ACETAMINOPHEN 160 MG/5ML PO SOLN: 650.0000 mg | Freq: Once | ORAL | Status: AC

## 2019-11-26 MED ORDER — ACETAMINOPHEN 500 MG PO TABS: 1000.0000 mg | ORAL_TABLET | Freq: Four times a day (QID) | ORAL | Status: DC

## 2019-11-26 MED ORDER — CHLORHEXIDINE GLUCONATE CLOTH 2 % EX PADS
6.0000 | MEDICATED_PAD | Freq: Every day | CUTANEOUS | Status: DC
  Administered 2019-11-26 – 2019-12-05 (×9): 6 via TOPICAL

## 2019-11-26 MED ORDER — METOPROLOL TARTRATE 12.5 MG HALF TABLET: 12.5000 mg | ORAL_TABLET | Freq: Two times a day (BID) | ORAL | Status: DC

## 2019-11-26 MED ORDER — AMIODARONE HCL IN DEXTROSE 360-4.14 MG/200ML-% IV SOLN
INTRAVENOUS | Status: DC | PRN
  Administered 2019-11-26 (×2): 30 mg/h via INTRAVENOUS

## 2019-11-26 MED ORDER — FAMOTIDINE IN NACL 20-0.9 MG/50ML-% IV SOLN
20.0000 mg | Freq: Two times a day (BID) | INTRAVENOUS | Status: AC
  Administered 2019-11-26: 22:00:00 20 mg via INTRAVENOUS
  Filled 2019-11-26: qty 50

## 2019-11-26 MED ORDER — ALBUMIN HUMAN 5 % IV SOLN
INTRAVENOUS | Status: AC
  Administered 2019-11-26: 12.5 g via INTRAVENOUS
  Filled 2019-11-26: qty 500

## 2019-11-26 MED ORDER — MIDAZOLAM HCL 2 MG/2ML IJ SOLN
2.0000 mg | INTRAMUSCULAR | Status: DC | PRN
  Administered 2019-11-26 (×2): 2 mg via INTRAVENOUS
  Filled 2019-11-26 (×2): qty 2

## 2019-11-26 MED ORDER — HEPARIN SODIUM (PORCINE) 1000 UNIT/ML IJ SOLN
INTRAMUSCULAR | Status: AC
  Filled 2019-11-26: qty 1

## 2019-11-26 MED ORDER — SODIUM CHLORIDE 0.9 % IV SOLN: 250.0000 mL | INTRAVENOUS | Status: DC

## 2019-11-26 MED ORDER — BUPIVACAINE LIPOSOME 1.3 % IJ SUSP
INTRAMUSCULAR | Status: DC | PRN
  Administered 2019-11-26: 50 mL

## 2019-11-26 MED ORDER — SODIUM CHLORIDE 0.45 % IV SOLN: INTRAVENOUS | Status: DC | PRN

## 2019-11-26 MED ORDER — ALBUMIN HUMAN 5 % IV SOLN
250.0000 mL | INTRAVENOUS | Status: AC | PRN
  Administered 2019-11-26 – 2019-11-27 (×3): 12.5 g via INTRAVENOUS
  Filled 2019-11-26: qty 500

## 2019-11-26 MED ORDER — LIDOCAINE HCL (CARDIAC) PF 100 MG/5ML IV SOSY
PREFILLED_SYRINGE | INTRAVENOUS | Status: AC
  Administered 2019-11-26: 18:00:00 100 mg
  Filled 2019-11-26: qty 5

## 2019-11-26 MED ORDER — BISACODYL 5 MG PO TBEC
10.0000 mg | DELAYED_RELEASE_TABLET | Freq: Every day | ORAL | Status: DC
  Administered 2019-11-28: 10 mg via ORAL
  Filled 2019-11-26: qty 2

## 2019-11-26 MED ORDER — FENTANYL CITRATE (PF) 100 MCG/2ML IJ SOLN: 50.0000 ug | Freq: Once | INTRAMUSCULAR | Status: DC

## 2019-11-26 MED ORDER — CHLORHEXIDINE GLUCONATE 0.12 % MT SOLN
15.0000 mL | OROMUCOSAL | Status: AC
  Administered 2019-11-26: 15 mL via OROMUCOSAL

## 2019-11-26 MED ORDER — INSULIN REGULAR(HUMAN) IN NACL 100-0.9 UT/100ML-% IV SOLN: INTRAVENOUS | Status: DC

## 2019-11-26 MED ORDER — CALCIUM CHLORIDE 10 % IV SOLN
INTRAVENOUS | Status: AC
  Administered 2019-11-26: 18:00:00 1000 mg
  Filled 2019-11-26: qty 10

## 2019-11-26 MED ORDER — MIDAZOLAM HCL 2 MG/2ML IJ SOLN
INTRAMUSCULAR | Status: AC
  Filled 2019-11-26: qty 2

## 2019-11-26 MED ORDER — LACTATED RINGERS IV SOLN: 500.0000 mL | Freq: Once | INTRAVENOUS | Status: DC | PRN

## 2019-11-26 MED ORDER — PANTOPRAZOLE SODIUM 40 MG PO TBEC: 40.0000 mg | DELAYED_RELEASE_TABLET | Freq: Every day | ORAL | Status: DC

## 2019-11-26 MED ORDER — ASPIRIN 81 MG PO CHEW: 324.0000 mg | CHEWABLE_TABLET | Freq: Every day | ORAL | Status: DC

## 2019-11-26 MED ORDER — VANCOMYCIN HCL 1000 MG IV SOLR
INTRAVENOUS | Status: AC
  Filled 2019-11-26: qty 1000

## 2019-11-26 MED ORDER — MIDAZOLAM HCL 5 MG/5ML IJ SOLN
INTRAMUSCULAR | Status: DC | PRN
  Administered 2019-11-26 (×6): 2 mg via INTRAVENOUS

## 2019-11-26 MED ORDER — FENTANYL CITRATE (PF) 250 MCG/5ML IJ SOLN
INTRAMUSCULAR | Status: DC | PRN
  Administered 2019-11-26 (×2): 250 ug via INTRAVENOUS
  Administered 2019-11-26: 100 ug via INTRAVENOUS
  Administered 2019-11-26: 150 ug via INTRAVENOUS

## 2019-11-26 MED ORDER — DEXTROSE 50 % IV SOLN: 0.0000 mL | INTRAVENOUS | Status: DC | PRN

## 2019-11-26 MED ORDER — MILRINONE LACTATE IN DEXTROSE 20-5 MG/100ML-% IV SOLN
0.2500 ug/kg/min | INTRAVENOUS | Status: DC
  Administered 2019-11-26 – 2019-11-30 (×6): 0.25 ug/kg/min via INTRAVENOUS
  Filled 2019-11-26 (×6): qty 100

## 2019-11-26 MED ORDER — HEPARIN SODIUM (PORCINE) 5000 UNIT/ML IJ SOLN
50000.0000 [IU] | INTRAVENOUS | Status: DC
  Filled 2019-11-26: qty 10

## 2019-11-26 MED ORDER — SODIUM CHLORIDE 0.9% IV SOLUTION: Freq: Once | INTRAVENOUS | Status: AC

## 2019-11-26 MED ORDER — HEPARIN SODIUM (PORCINE) 5000 UNIT/ML IJ SOLN
50000.0000 [IU] | INTRAVENOUS | Status: DC
  Administered 2019-11-26 – 2019-11-30 (×2): 50000 [IU]
  Filled 2019-11-26 (×5): qty 10

## 2019-11-26 MED ORDER — NOREPINEPHRINE 16 MG/250ML-% IV SOLN
0.0000 ug/min | INTRAVENOUS | Status: DC
  Administered 2019-11-26: 18:00:00 25 ug/min via INTRAVENOUS
  Administered 2019-11-27: 22 ug/min via INTRAVENOUS
  Administered 2019-11-28: 28 ug/min via INTRAVENOUS
  Administered 2019-11-28: 15:00:00 14 ug/min via INTRAVENOUS
  Administered 2019-12-01: 5 ug/min via INTRAVENOUS
  Filled 2019-11-26 (×5): qty 250

## 2019-11-26 MED ORDER — PHENYLEPHRINE 40 MCG/ML (10ML) SYRINGE FOR IV PUSH (FOR BLOOD PRESSURE SUPPORT)
PREFILLED_SYRINGE | INTRAVENOUS | Status: AC
  Filled 2019-11-26: qty 10

## 2019-11-26 MED ORDER — TRAMADOL HCL 50 MG PO TABS
50.0000 mg | ORAL_TABLET | ORAL | Status: DC | PRN
  Administered 2019-11-29 – 2019-12-03 (×6): 100 mg via ORAL
  Administered 2019-12-07 (×2): 50 mg via ORAL
  Filled 2019-11-26: qty 1
  Filled 2019-11-26 (×5): qty 2
  Filled 2019-11-26: qty 1
  Filled 2019-11-26: qty 2

## 2019-11-26 MED ORDER — VANCOMYCIN HCL IN DEXTROSE 1-5 GM/200ML-% IV SOLN
1000.0000 mg | Freq: Once | INTRAVENOUS | Status: AC
  Administered 2019-11-26: 1000 mg via INTRAVENOUS
  Filled 2019-11-26: qty 200

## 2019-11-26 MED ORDER — PHENYLEPHRINE HCL (PRESSORS) 10 MG/ML IV SOLN
INTRAVENOUS | Status: AC
  Filled 2019-11-26: qty 2

## 2019-11-26 MED ORDER — ARTIFICIAL TEARS OPHTHALMIC OINT
TOPICAL_OINTMENT | OPHTHALMIC | Status: AC
  Filled 2019-11-26: qty 3.5

## 2019-11-26 MED ORDER — HEMOSTATIC AGENTS (NO CHARGE) OPTIME
TOPICAL | Status: DC | PRN
  Administered 2019-11-26 (×2): 1 via TOPICAL

## 2019-11-26 MED ORDER — LIDOCAINE 2% (20 MG/ML) 5 ML SYRINGE
INTRAMUSCULAR | Status: AC
  Filled 2019-11-26: qty 5

## 2019-11-26 MED ORDER — NITROGLYCERIN IN D5W 200-5 MCG/ML-% IV SOLN: 0.0000 ug/min | INTRAVENOUS | Status: DC

## 2019-11-26 MED ORDER — OXYCODONE HCL 5 MG PO TABS
5.0000 mg | ORAL_TABLET | ORAL | Status: DC | PRN
  Administered 2019-11-30 – 2019-12-08 (×20): 10 mg via ORAL
  Filled 2019-11-26 (×20): qty 2

## 2019-11-26 MED ORDER — ALBUMIN HUMAN 5 % IV SOLN: INTRAVENOUS | Status: DC | PRN

## 2019-11-26 MED ORDER — ACETAMINOPHEN 160 MG/5ML PO SOLN
1000.0000 mg | Freq: Four times a day (QID) | ORAL | Status: DC
  Administered 2019-11-26 – 2019-11-27 (×2): 1000 mg
  Filled 2019-11-26 (×2): qty 40.6

## 2019-11-26 MED ORDER — EPINEPHRINE HCL 5 MG/250ML IV SOLN IN NS
INTRAVENOUS | Status: DC | PRN
  Administered 2019-11-26: 2 ug/kg/min via INTRAVENOUS

## 2019-11-26 SURGICAL SUPPLY — 127 items
ADAPTER CARDIO PERF ANTE/RETRO (ADAPTER) ×3 IMPLANT
ANCHOR CATH FOLEY SECURE (MISCELLANEOUS) ×3 IMPLANT
ATRICLIP EXCLUSION VLAA SYSTEM (Miscellaneous) ×3 IMPLANT
BAG DECANTER FOR FLEXI CONT (MISCELLANEOUS) ×4 IMPLANT
BASKET HEART (ORDER IN 25'S) (MISCELLANEOUS) ×1
BASKET HEART (ORDER IN 25S) (MISCELLANEOUS) ×2 IMPLANT
BLADE 15 SAFETY STRL DISP (BLADE) ×1 IMPLANT
BLADE CLIPPER SURG (BLADE) ×2 IMPLANT
BLADE STERNUM SYSTEM 6 (BLADE) ×3 IMPLANT
BLADE SURG 11 STRL SS (BLADE) ×1 IMPLANT
BNDG ELASTIC 4X5.8 VLCR STR LF (GAUZE/BANDAGES/DRESSINGS) ×3 IMPLANT
BNDG ELASTIC 6X5.8 VLCR STR LF (GAUZE/BANDAGES/DRESSINGS) ×3 IMPLANT
BNDG GAUZE ELAST 4 BULKY (GAUZE/BANDAGES/DRESSINGS) ×3 IMPLANT
CANISTER SUCT 3000ML PPV (MISCELLANEOUS) ×3 IMPLANT
CANN PRFSN 3/8XCNCT ST RT ANG (MISCELLANEOUS) ×2
CANN PRFSN 3/8XRT ANG TPR 14 (MISCELLANEOUS) ×2
CANNULA NON VENT 20FR 12 (CANNULA) ×1 IMPLANT
CANNULA PRFSN 3/8XCNCT RT ANG (MISCELLANEOUS) IMPLANT
CANNULA PRFSN 3/8XRT ANG TPR14 (MISCELLANEOUS) IMPLANT
CANNULA SUMP PERICARDIAL (CANNULA) ×1 IMPLANT
CANNULA VEN MTL TIP RT (MISCELLANEOUS) ×2
CATH CPB KIT HENDRICKSON (MISCELLANEOUS) ×3 IMPLANT
CATH RETROPLEGIA CORONARY 14FR (CATHETERS) ×1 IMPLANT
CATH ROBINSON RED A/P 18FR (CATHETERS) ×8 IMPLANT
CLAMP ISOLATOR SYNERGY LG (MISCELLANEOUS) ×1 IMPLANT
CLIP RETRACTION 3.0MM CORONARY (MISCELLANEOUS) ×3 IMPLANT
CLIP VESOCCLUDE SM WIDE 24/CT (CLIP) ×3 IMPLANT
CONN 1/2X1/2X1/2  BEN (MISCELLANEOUS) ×1
CONN 1/2X1/2X1/2 BEN (MISCELLANEOUS) IMPLANT
CONN 3/8X1/2 ST GISH (MISCELLANEOUS) ×2 IMPLANT
CONN ST 1/4X3/8  BEN (MISCELLANEOUS) ×4
CONN ST 1/4X3/8 BEN (MISCELLANEOUS) IMPLANT
COVER PROBE W GEL 5X96 (DRAPES) ×1 IMPLANT
COVER WAND RF STERILE (DRAPES) ×3 IMPLANT
DERMABOND ADVANCED (GAUZE/BANDAGES/DRESSINGS) ×2
DERMABOND ADVANCED .7 DNX12 (GAUZE/BANDAGES/DRESSINGS) ×2 IMPLANT
DRAIN CHANNEL 28F RND 3/8 FF (WOUND CARE) ×9 IMPLANT
DRAPE CARDIOVASCULAR INCISE (DRAPES) ×1
DRAPE SLUSH/WARMER DISC (DRAPES) ×3 IMPLANT
DRAPE SRG 135X102X78XABS (DRAPES) ×2 IMPLANT
DRSG AQUACEL AG ADV 3.5X14 (GAUZE/BANDAGES/DRESSINGS) ×3 IMPLANT
DRSG TEGADERM 4X4.5 CHG (GAUZE/BANDAGES/DRESSINGS) ×1 IMPLANT
ELECT CAUTERY BLADE 6.4 (BLADE) ×3 IMPLANT
ELECT REM PT RETURN 9FT ADLT (ELECTROSURGICAL) ×6
ELECTRODE REM PT RTRN 9FT ADLT (ELECTROSURGICAL) ×4 IMPLANT
FELT TEFLON 1X6 (MISCELLANEOUS) ×6 IMPLANT
GAUZE SPONGE 4X4 12PLY STRL (GAUZE/BANDAGES/DRESSINGS) ×6 IMPLANT
GAUZE SPONGE 4X4 12PLY STRL LF (GAUZE/BANDAGES/DRESSINGS) ×2 IMPLANT
GLOVE BIO SURGEON STRL SZ 6 (GLOVE) ×3 IMPLANT
GLOVE BIO SURGEON STRL SZ7.5 (GLOVE) ×2 IMPLANT
GLOVE BIOGEL PI IND STRL 6 (GLOVE) IMPLANT
GLOVE BIOGEL PI IND STRL 7.5 (GLOVE) IMPLANT
GLOVE BIOGEL PI INDICATOR 6 (GLOVE) ×3
GLOVE BIOGEL PI INDICATOR 7.5 (GLOVE) ×1
GLOVE NEODERM STRL 7.5 LF PF (GLOVE) ×6 IMPLANT
GLOVE SURG NEODERM 7.5  LF PF (GLOVE) ×6
GOWN STRL REUS W/ TWL LRG LVL3 (GOWN DISPOSABLE) ×8 IMPLANT
GOWN STRL REUS W/TWL LRG LVL3 (GOWN DISPOSABLE) ×9
HEMOSTAT POWDER SURGIFOAM 1G (HEMOSTASIS) ×8 IMPLANT
HEMOSTAT SURGICEL 2X14 (HEMOSTASIS) ×3 IMPLANT
HEMOSTAT SURGICEL 2X4 FIBR (HEMOSTASIS) ×1 IMPLANT
INSERT CONFORM CROSS CLAMP 66M (MISCELLANEOUS) ×2 IMPLANT
INSERT CONFORM CROSS CLAMP 86M (MISCELLANEOUS) ×2 IMPLANT
INSERT FOGARTY XLG (MISCELLANEOUS) ×1 IMPLANT
IV ADAPTER SYR DOUBLE MALE LL (MISCELLANEOUS) ×1 IMPLANT
KIT BASIN OR (CUSTOM PROCEDURE TRAY) ×3 IMPLANT
KIT SUCTION CATH 14FR (SUCTIONS) ×3 IMPLANT
KIT TURNOVER KIT B (KITS) ×3 IMPLANT
KIT VASOVIEW HEMOPRO 2 VH 4000 (KITS) ×3 IMPLANT
LEAD PACING MYOCARDI (MISCELLANEOUS) ×3 IMPLANT
MARKER GRAFT CORONARY BYPASS (MISCELLANEOUS) ×9 IMPLANT
NDL 18GX1X1/2 (RX/OR ONLY) (NEEDLE) ×2 IMPLANT
NEEDLE 18GX1X1/2 (RX/OR ONLY) (NEEDLE) ×3 IMPLANT
NS IRRIG 1000ML POUR BTL (IV SOLUTION) ×15 IMPLANT
PACK E OPEN HEART (SUTURE) ×3 IMPLANT
PACK OPEN HEART (CUSTOM PROCEDURE TRAY) ×3 IMPLANT
PACK SPY-PHI (KITS) ×1 IMPLANT
PAD ARMBOARD 7.5X6 YLW CONV (MISCELLANEOUS) ×4 IMPLANT
PAD ELECT DEFIB RADIOL ZOLL (MISCELLANEOUS) ×3 IMPLANT
PENCIL BUTTON HOLSTER BLD 10FT (ELECTRODE) ×3 IMPLANT
POSITIONER HEAD DONUT 9IN (MISCELLANEOUS) ×3 IMPLANT
POWDER SURGICEL 3.0 GRAM (HEMOSTASIS) ×1 IMPLANT
PUNCH AORTIC ROTATE  4.5MM 8IN (MISCELLANEOUS) ×1 IMPLANT
SEALANT SURG COSEAL 8ML (VASCULAR PRODUCTS) ×1 IMPLANT
SET CARDIOPLEGIA MPS 5001102 (MISCELLANEOUS) ×1 IMPLANT
SOL ANTI FOG 6CC (MISCELLANEOUS) IMPLANT
SOLUTION ANTI FOG 6CC (MISCELLANEOUS) ×1
SPONGE LAP 18X18 X RAY DECT (DISPOSABLE) ×1 IMPLANT
SUT BONE WAX W31G (SUTURE) ×3 IMPLANT
SUT ETHIBOND 2 0 SH (SUTURE) ×1 IMPLANT
SUT MNCRL AB 3-0 PS2 18 (SUTURE) ×6 IMPLANT
SUT MNCRL AB 4-0 PS2 18 (SUTURE) ×1 IMPLANT
SUT PDS AB 1 CTX 36 (SUTURE) ×6 IMPLANT
SUT PROLENE 3 0 SH DA (SUTURE) ×7 IMPLANT
SUT PROLENE 4 0 RB 1 (SUTURE) ×1
SUT PROLENE 4 0 SH DA (SUTURE) ×1 IMPLANT
SUT PROLENE 4-0 RB1 .5 CRCL 36 (SUTURE) IMPLANT
SUT PROLENE 5 0 C 1 36 (SUTURE) IMPLANT
SUT PROLENE 6 0 C 1 30 (SUTURE) ×9 IMPLANT
SUT PROLENE 7 0 BV 1 (SUTURE) ×4 IMPLANT
SUT PROLENE 8 0 BV175 6 (SUTURE) IMPLANT
SUT PROLENE BLUE 7 0 (SUTURE) ×3 IMPLANT
SUT SILK  1 MH (SUTURE) ×2
SUT SILK 1 MH (SUTURE) IMPLANT
SUT SILK 2 0 SH CR/8 (SUTURE) IMPLANT
SUT SILK 3 0 SH CR/8 (SUTURE) IMPLANT
SUT STEEL 6MS V (SUTURE) ×3 IMPLANT
SUT STEEL SZ 6 DBL 3X14 BALL (SUTURE) ×3 IMPLANT
SUT VIC AB 2-0 CT1 27 (SUTURE) ×1
SUT VIC AB 2-0 CT1 TAPERPNT 27 (SUTURE) IMPLANT
SUT VIC AB 2-0 CTX 27 (SUTURE) IMPLANT
SUT VIC AB 3-0 X1 27 (SUTURE) IMPLANT
SYR 10ML LL (SYRINGE) ×2 IMPLANT
SYR 30ML LL (SYRINGE) ×3 IMPLANT
SYR 3ML LL SCALE MARK (SYRINGE) ×3 IMPLANT
SYSTEM EXCLUSION ATRICLIP VLAA (Miscellaneous) IMPLANT
SYSTEM SAHARA CHEST DRAIN ATS (WOUND CARE) ×3 IMPLANT
TAPE CLOTH SURG 4X10 WHT LF (GAUZE/BANDAGES/DRESSINGS) ×1 IMPLANT
TAPE PAPER 2X10 WHT MICROPORE (GAUZE/BANDAGES/DRESSINGS) ×1 IMPLANT
TOWEL GREEN STERILE (TOWEL DISPOSABLE) ×3 IMPLANT
TOWEL GREEN STERILE FF (TOWEL DISPOSABLE) ×3 IMPLANT
TRAY FOLEY SLVR 16FR TEMP STAT (SET/KITS/TRAYS/PACK) ×3 IMPLANT
TUBING ART PRESS 48 MALE/FEM (TUBING) ×2 IMPLANT
TUBING LAP HI FLOW INSUFFLATIO (TUBING) ×3 IMPLANT
UNDERPAD 30X30 (UNDERPADS AND DIAPERS) ×3 IMPLANT
WATER STERILE IRR 1000ML POUR (IV SOLUTION) ×6 IMPLANT
WATER STERILE IRR 1000ML UROMA (IV SOLUTION) IMPLANT

## 2019-11-26 NOTE — Progress Notes (Signed)
  Echocardiogram Echocardiogram Transesophageal has been performed.  Kevin Underwood M 11/26/2019, 8:25 AM 

## 2019-11-26 NOTE — Brief Op Note (Signed)
11/20/2019 - 11/26/2019  12:33 PM  PATIENT:  Kevin Underwood  58 y.o. male  PRE-OPERATIVE DIAGNOSIS:  CAD  POST-OPERATIVE DIAGNOSIS:  CAD  PROCEDURE:  Procedure(s): CORONARY ARTERY BYPASS GRAFTING (CABG) (N/A) TRANSESOPHAGEAL ECHOCARDIOGRAM (TEE) (N/A) INDOCYANINE GREEN FLUORESCENCE IMAGING (ICG) (N/A) MAZE (N/A) Clipping Of Atrial Appendage using AtriCure 40 Clip (N/A) LIMA-LAD SVG-OM1 SVG-PD SVG-DIAG  SURGEON:  Surgeon(s) and Role:    * Wonda Olds, MD - Primary  PHYSICIAN ASSISTANT: Evon Lopezperez PA-C   ANESTHESIA:   general  EBL:  SEE ANESTHESIA AND PERFUSION RECORDS   BLOOD ADMINISTERED:SEE ANESTHESIA AND PERFUSION RECORDS   DRAINS: PLEURAL AND PERICARDIAL CHEST TUBES   LOCAL MEDICATIONS USED:  BUPIVICAINE   SPECIMEN:  No Specimen  DISPOSITION OF SPECIMEN:  N/A  COUNTS:  YES  TOURNIQUET:  * No tourniquets in log *  DICTATION: .Dragon Dictation  PLAN OF CARE: Admit to inpatient   PATIENT DISPOSITION:  ICU - intubated and hemodynamically stable.   Delay start of Pharmacological VTE agent (>24hrs) due to surgical blood loss or risk of bleeding: yes  COMPLICATIONS: NO KNOWN

## 2019-11-26 NOTE — Progress Notes (Signed)
ABG done on room air per pre surgery orders.

## 2019-11-26 NOTE — Progress Notes (Signed)
CT surgery p.m. Rounds  Patient back from OR--CABG x4, Maze procedure, preop Impella 5.5 Sedated on ventilator Heparin in purge on hold for 6 hours because of borderline increased chest tube output Hemodynamics stable on P7 Impella support

## 2019-11-26 NOTE — Anesthesia Preprocedure Evaluation (Signed)
Anesthesia Evaluation  Patient identified by MRN, date of birth, ID band Patient awake    Reviewed: Allergy & Precautions, H&P , NPO status , Patient's Chart, lab work & pertinent test results  Airway Mallampati: II   Neck ROM: full    Dental   Pulmonary sleep apnea , COPD, Current Smoker,    breath sounds clear to auscultation       Cardiovascular hypertension, + CAD and +CHF   Rhythm:regular Rate:Normal  Impella in place   Neuro/Psych    GI/Hepatic   Endo/Other  diabetes, Type 2  Renal/GU      Musculoskeletal  (+) Arthritis ,   Abdominal   Peds  Hematology   Anesthesia Other Findings   Reproductive/Obstetrics                             Anesthesia Physical Anesthesia Plan  ASA: IV  Anesthesia Plan: General   Post-op Pain Management:    Induction: Intravenous  PONV Risk Score and Plan: 1 and Ondansetron, Dexamethasone, Midazolam and Treatment may vary due to age or medical condition  Airway Management Planned: Oral ETT  Additional Equipment: Arterial line, CVP, TEE, PA Cath and Ultrasound Guidance Line Placement  Intra-op Plan:   Post-operative Plan: Post-operative intubation/ventilation  Informed Consent: I have reviewed the patients History and Physical, chart, labs and discussed the procedure including the risks, benefits and alternatives for the proposed anesthesia with the patient or authorized representative who has indicated his/her understanding and acceptance.       Plan Discussed with: CRNA, Anesthesiologist and Surgeon  Anesthesia Plan Comments:         Anesthesia Quick Evaluation

## 2019-11-26 NOTE — Progress Notes (Signed)
Patient ID: Kevin Underwood, male   DOB: July 23, 1961, 58 y.o.   MRN: [id number]     Advanced Heart Failure Rounding Note  PCP-Cardiologist: Nona Dell, MD   Subjective:    - Impella 5.5 placed 12/16 due to cardiogenic shock.  - Extubated 12/17  He received IV Lasix yesterday with good diuresis, net negative I/Os  This morning, he feels very good.  He remains on milrinone 0.375, off pressors.  Creatinine stable, urine clear, LDH 314. Afebrile.    He has remained in NSR on amiodarone and heparin gtt.    Impella: Flow 4.4 L/min at P8, good waveforms.   Swan numbers: CVP 10 PA 41/16 CO 4.2 CI 2.1 Co-ox 79.5%  LHC/RHC: Coronary Findings  Diagnostic Dominance: Right Left Main  30% distal LM stenosis.  Left Anterior Descending  Heavily calcified proximal and mid LAD. There is 95% stenosis in the proximal LAD just after take-off of a high D1. D1 has moderate diffuse disease.  Left Circumflex  Long 80% mid LCx stenosis proximal to PLOM.  Right Coronary Artery  RCA with 50% proximal stenosis, 30% mid stenosis. PDA with 75% proximal stenosis.  Intervention  No interventions have been documented. Right Heart  Right Heart Pressures RHC Procedural Findings: Hemodynamics (mmHg) RA mean 13 RV 45/18 PA 53/22, mean 39 PCWP mean 24 LV 106/28 AO 99/66  Oxygen saturations: PA 53% AO 97%  Cardiac Output (Fick) 2.92  Cardiac Index (Fick) 1.42 PVR 5.1 WU PAPI 2.4      Objective:   Weight Range: 86 kg Body mass index is 27.2 kg/m.   Vital Signs:   Temp:  [97 F (36.1 C)-99.3 F (37.4 C)] 97.7 F (36.5 C) (12/18 0700) Pulse Rate:  [38-130] 82 (12/18 0700) Resp:  [10-22] 11 (12/18 0700) BP: (84-123)/(54-112) 113/91 (12/18 0400) SpO2:  [81 %-100 %] 95 % (12/18 0700) Arterial Line BP: (78-127)/(63-101) 121/79 (12/18 0700) FiO2 (%):  [40 %-50 %] 40 % (12/17 1049) Weight:  [86 kg] 86 kg (12/18 0549) Last BM Date: 11/23/19  Weight change: Filed Weights   11/24/19 0614 11/25/19 0410 11/26/19 0549  Weight: 85.3 kg 90.1 kg 86 kg    Intake/Output:   Intake/Output Summary (Last 24 hours) at 11/26/2019 0939 Last data filed at 11/26/2019 0539 Gross per 24 hour  Intake 2256.95 ml  Output 5570 ml  Net -3313.05 ml      Physical Exam    General: NAD Neck: JVP 8-9 cm, no thyromegaly or thyroid nodule.  Lungs: Mildly decreased BS at bases.  CV: Lateral PMI.  Heart regular S1/S2, no S3/S4, no murmur.  No peripheral edema.   Abdomen: Soft, nontender, no hepatosplenomegaly, no distention.  Skin: Intact without lesions or rashes.  Neurologic: Alert and oriented x 3.  Psych: Normal affect. Extremities: No clubbing or cyanosis.  HEENT: Normal.    Telemetry   NSR 70s (personally reviewed)  Labs    CBC Recent Labs    11/25/19 0500 11/26/19 0020 11/26/19 0513  WBC 11.9*  --  10.5  HGB 14.7 13.9 12.6*  HCT 44.9 41.0 39.1  MCV 87.4  --  88.5  PLT 265  --  142*   Basic Metabolic Panel Recent Labs    76/73/41 0500 11/26/19 0100 11/26/19 0513  NA 136 133* 135  K 4.3 3.7 3.4*  CL 102 99 98  CO2 24 26 29   GLUCOSE 227* 157* 120*  BUN 27* 22* 18  CREATININE 1.17 0.97 0.95  CALCIUM 8.0* 7.7* 7.8*  MG 2.2 2.0  --    Liver Function Tests Recent Labs    11/24/19 2114  AST 76*  ALT 111*  ALKPHOS 92  BILITOT 0.5  PROT 4.6*  ALBUMIN 2.6*   No results for input(s): LIPASE, AMYLASE in the last 72 hours. Cardiac Enzymes No results for input(s): CKTOTAL, CKMB, CKMBINDEX, TROPONINI in the last 72 hours.  BNP: BNP (last 3 results) Recent Labs    11/22/19 0414 11/23/19 0453 11/25/19 0500  BNP 578.0* 1,054.0* 596.1*    ProBNP (last 3 results) No results for input(s): PROBNP in the last 8760 hours.   D-Dimer No results for input(s): DDIMER in the last 72 hours. Hemoglobin A1C No results for input(s): HGBA1C in the last 72 hours. Fasting Lipid Panel Recent Labs    11/24/19 2114  TRIG 40   Thyroid Function  Tests No results for input(s): TSH, T4TOTAL, T3FREE, THYROIDAB in the last 72 hours.  Invalid input(s): FREET3  Other results:   Imaging    DG CHEST PORT 1 VIEW  Result Date: 11/26/2019 CLINICAL DATA:  Placement of Impella catheter. EXAM: PORTABLE CHEST 1 VIEW COMPARISON:  One-view chest x-ray 11/25/2019. FINDINGS: Impella left ventricular assist device is stable in position, projecting over the left ventricle. The patient has been extubated. NG tube was removed. Swan-Ganz catheter is stable in position, in the proximal right pulmonary artery. The heart is enlarged. Atherosclerotic calcifications are again noted at the aortic arch. Aeration is improved. Pulmonary vascular congestion persist. IMPRESSION: 1. Stable position of Impella left ventricular assist device. 2. Stable cardiomegaly and pulmonary vascular congestion. 3. Improved aeration. 4. Stable Swan-Ganz catheter. Electronically Signed   By: San Morelle M.D.   On: 11/26/2019 07:59   ECHOCARDIOGRAM LIMITED  Result Date: 11/25/2019   ECHOCARDIOGRAM LIMITED REPORT   Patient Name:   Kevin Underwood Date of Exam: 11/25/2019 Medical Rec #:  [id number]        Height:       70.0 in Accession #:    [id number]       Weight:       198.6 lb Date of Birth:  [birth year]        BSA:          2.08 m Patient Age:    31 years         BP:           84/64 mmHg Patient Gender: M                HR:           90 bpm. Exam Location:  Inpatient  Procedure: Echo Assisted Procedure Indications:    Impella Placement  History:        Patient has prior history of Echocardiogram examinations, most                 recent 11/24/2019.  Sonographer:    Mikki Santee RDCS (AE) Referring Phys: [place]  1. LVEF <20%, severe gloal hypokinesis  2. Impella positioned to 4.52 cm past the aortic valve in the LV  3. Small pericardial effusion is posterior to the left ventricle.  4. Left ventricular ejection fraction, by visual estimation, is  <20%. The left ventricle has severely decreased function. There is no left ventricular hypertrophy.  5. Aortic root could not be assessed.  6. Small pericardial effusion.  7. Global right ventricle was not assessed.The right ventricular size is not assessed. Right vetricular wall thickness was  not assessed.  8. The tricuspid valve is not assessed. Tricuspid valve regurgitation is not demonstrated. FINDINGS  Left Ventricle: Left ventricular ejection fraction, by visual estimation, is <20%. The left ventricle has normal function. There is no increased left ventricular wall thickness. Normal left atrial pressure. Impella positioned to 4.52 cm past the aortic valve in the LV.  Pericardium: A small pericardial effusion is present is seen. A small pericardial effusion is present. The pericardial effusion is posterior to the left ventricle.  The aortic valve was not assessed.  Aortic root could not be assessed.  Electronically signed by Zoila Shutter MD Signature Date/Time: 11/25/2019/2:10:16 PM   Final      Medications:     Scheduled Medications: . [MAR Hold] sodium chloride   Intravenous Once  . [MAR Hold] aspirin EC  81 mg Oral Daily  . bisacodyl  5 mg Oral Once  . [MAR Hold] Chlorhexidine Gluconate Cloth  6 each Topical Q0600  . Chlorhexidine Gluconate Cloth  6 each Topical Once  . [MAR Hold] digoxin  0.125 mg Oral Daily  . epinephrine  0-10 mcg/min Intravenous To OR  . [MAR Hold] gabapentin  600 mg Oral BID  . [MAR Hold] insulin aspart  0-15 Units Subcutaneous TID WC  . [MAR Hold] insulin aspart  0-5 Units Subcutaneous QHS  . [MAR Hold] insulin aspart  2 Units Subcutaneous TID WC  . [MAR Hold] insulin glargine  10 Units Subcutaneous Daily  . insulin   Intravenous To OR  . [MAR Hold] ipratropium-albuterol  3 mL Inhalation BID  . magnesium sulfate  40 mEq Other To OR  . [MAR Hold] mouth rinse  15 mL Mouth Rinse BID  . [MAR Hold] mometasone-formoterol  2 puff Inhalation BID  . phenylephrine   30-200 mcg/min Intravenous To OR  . [MAR Hold] potassium chloride  10 mEq Oral BID  . potassium chloride  80 mEq Other To OR  . [MAR Hold] rosuvastatin  40 mg Oral q1800  . [MAR Hold] sodium chloride flush  10-40 mL Intracatheter Q12H  . [MAR Hold] sodium chloride flush  3 mL Intravenous Q12H  . [MAR Hold] sodium chloride flush  3 mL Intravenous Q12H  . [MAR Hold] spironolactone  12.5 mg Oral Daily  . tranexamic acid  15 mg/kg Intravenous To OR  . tranexamic acid  2 mg/kg Intracatheter To OR  . vancomycin 1000 mg in NS (1000 ml) irrigation for Dr. Cornelius Moras case   Irrigation To OR    Infusions: . [MAR Hold] sodium chloride    . amiodarone 30 mg/hr (11/25/19 2232)  . cefUROXime (ZINACEF)  IV    . cefUROXime (ZINACEF)  IV    . [MAR Hold] epinephrine Stopped (11/25/19 1844)  . furosemide (LASIX) infusion Stopped (11/26/19 0539)  . heparin 30,000 units/NS 1000 mL solution for CELLSAVER    . impella catheter heparin 50 unit/mL in dextrose 5%    . heparin 1,000 Units/hr (11/26/19 0405)  . milrinone 0.375 mcg/kg/min (11/25/19 2236)  . milrinone    . [MAR Hold] nitroGLYCERIN Stopped (11/24/19 2207)  . nitroGLYCERIN    . [MAR Hold] norepinephrine (LEVOPHED) Adult infusion Stopped (11/25/19 1644)  . norepinephrine    . [MAR Hold] potassium chloride 10 mEq (11/26/19 0701)  . [MAR Hold] propofol (DIPRIVAN) infusion Stopped (11/25/19 0910)  . tranexamic acid (CYKLOKAPRON) infusion ([facility])    . vancomycin    . vasopressin (PITRESSIN) infusion - *FOR SHOCK*      PRN Medications: [MAR Hold] sodium chloride,  0.9 % irrigation (POUR BTL), 0.9 % irrigation (POUR BTL), [MAR Hold] acetaminophen, [MAR Hold] diazepam, [MAR Hold] fentaNYL (SUBLIMAZE) injection, [MAR Hold] haloperidol lactate, [MAR Hold]  HYDROmorphone (DILAUDID) injection, [MAR Hold] levalbuterol, [MAR Hold] nicotine, [MAR Hold] ondansetron (ZOFRAN) IV, [MAR Hold] sodium chloride flush, [MAR Hold] sodium chloride flush, sterile water  (preservative free)    Assessment/Plan   1. Acute systolic CHF => cardiogenic shock:  Echo showed EF < 20% with mildly decreased RV systolic function. He was admitted with afib/RVR and CHF.  Cath with elevated filing pressures, low CO (CI 1.4 by RHC), and severe 3 vessel coronary disease.  Ischemic cardiomyopathy, possibly contribution from tachycardia with previous atrial fibrillation. Impella 5.5 placed due to cardiogenic shock on 12/16.  This morning, on P8 with good flow.  Remains on milrinone 0.375.  MAP and CO stable, co-ox 79.5%.  CVP around 10, I/Os net negative 3.3 L yesterday with IV Lasix.  - Continue digoxin 0.125. - Continue current Impella speed.  - To OR this morning for CABG.  2. Atrial fibrillation: With RVR at admission, back in NSR now.   - Restart anticoagulation post-op when deemed stable by surgeon.  - Continue amiodarone gtt.  3. CAD: Severe 3VD in diabetic smoker.  He has exertional chest pain at baseline.  Was not admitted with ACS.  Anatomy best-suited to CABG.  - Continue ASA 81 daily.  - rosuvastatin 40 daily.   - For CABG this morning.   4. 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  Suspect group 2 + group 3 (COPD). Diuresis will be main treatment. PA pressures have come down with diuresis.  5. Diabetes: Poor control.  Per IM, would eventually benefit from SGLT2 inhibitor.  6. OSA: Strongly suspected.  7. COPD: Suspected, former smoker.  Emphysema seen on CT chest.  8. ID: Afebrile, WBCs lower today at 10.   CRITICAL CARE Performed by: Marca Anconaalton Shadiyah Wernli  Total critical care time: 35 minutes  Critical care time was exclusive of separately billable procedures and treating other patients.  Critical care was necessary to treat or prevent imminent or life-threatening deterioration.  Critical care was time spent personally by me on the following activities: development of treatment plan with patient and/or surrogate as well as nursing,  discussions with consultants, evaluation of patient's response to treatment, examination of patient, obtaining history from patient or surrogate, ordering and performing treatments and interventions, ordering and review of laboratory studies, ordering and review of radiographic studies, pulse oximetry and re-evaluation of patient's condition.   Length of Stay: 6  Marca Anconaalton Alisea Matte, MD  11/26/2019, 9:39 AM  Advanced Heart Failure Team Pager [phone] (M-F; 7a - 4p)  Please contact [facility] for night-coverage after hours (4p -7a ) and weekends on [email or web address]

## 2019-11-26 NOTE — H&P (Signed)
History and Physical Interval Note:  11/26/2019 6:48 AM  Kevin Underwood  has presented today for surgery, with the diagnosis of ISCHEMIC CARDIOMYOPATHY.  The various methods of treatment have been discussed with the patient and family. After consideration of risks, benefits and other options for treatment, the patient has consented to  [place] as a surgical intervention.  The patient's history has been reviewed, patient examined, no change in status, stable for surgery.  I have reviewed the patient's chart and labs.  Questions were answered to the patient's satisfaction.     Wonda Olds

## 2019-11-26 NOTE — Anesthesia Procedure Notes (Signed)
Procedure Name: Intubation Date/Time: 11/26/2019 8:06 AM Performed by: Lowella Dell, CRNA Pre-anesthesia Checklist: Patient identified, Emergency Drugs available, Suction available and Patient being monitored Patient Re-evaluated:Patient Re-evaluated prior to induction Oxygen Delivery Method: Circle System Utilized Preoxygenation: Pre-oxygenation with 100% oxygen Induction Type: IV induction Ventilation: Mask ventilation without difficulty and Oral airway inserted - appropriate to patient size Laryngoscope Size: Mac and 4 Grade View: Grade I Tube type: Oral Tube size: 8.0 mm Number of attempts: 1 Airway Equipment and Method: Stylet Placement Confirmation: ETT inserted through vocal cords under direct vision,  positive ETCO2 and breath sounds checked- equal and bilateral Secured at: 21 cm Tube secured with: Tape Dental Injury: Teeth and Oropharynx as per pre-operative assessment

## 2019-11-26 NOTE — Brief Op Note (Signed)
11/26/2019  3:44 PM  PATIENT:  Kevin Underwood  58 y.o. male  PRE-OPERATIVE DIAGNOSIS:  CAD with ischemic cardiomyopathy  POST-OPERATIVE DIAGNOSIS:  same  PROCEDURE:  Procedure(s): CORONARY ARTERY BYPASS GRAFTING (CABG) using endoscopic greater saphenous vein harvest: svc to OM; svc to Diag; svc to PD; and LIMA to LAD. (N/A) TRANSESOPHAGEAL ECHOCARDIOGRAM (TEE) (N/A) INDOCYANINE GREEN FLUORESCENCE IMAGING (ICG) (N/A) MAZE using Bilateral Pulmonary Vein isolation. (N/A) Clipping Of Atrial Appendage using AtriCure 40 Clip (N/A)  SURGEON:  Surgeon(s) and Role:    * Wonda Olds, MD - Primary  PHYSICIAN ASSISTANT: Girtha Rm, W PA-C  ASSISTANTS: staff   ANESTHESIA:   general  EBL:  1650 mL   BLOOD ADMINISTERED:900 CC CELLSAVER  DRAINS: 4 Chest Tube(s) in the mediastinum and bilateral pleural space   LOCAL MEDICATIONS USED:  OTHER exparel  SPECIMEN:  No Specimen  DISPOSITION OF SPECIMEN:  N/A  COUNTS:  YES  TOURNIQUET:  * No tourniquets in log *  DICTATION: .Note written in EPIC  PLAN OF CARE: Admit to inpatient   PATIENT DISPOSITION:  ICU - intubated and critically ill.   Delay start of Pharmacological VTE agent (>24hrs) due to surgical blood loss or risk of bleeding: yes

## 2019-11-26 NOTE — Progress Notes (Signed)
  ANTICOAGULATION CONSULT NOTE  Pharmacy Consult for Heparin Indication: Impella  No Known Allergies  Patient Measurements: Height: 5\' 10"  (177.8 cm) Weight: 198 lb 10.2 oz (90.1 kg) IBW/kg (Calculated) : 73 Heparin Dosing Weight: 85.3 kg  Vital Signs: Temp: 97.5 F (36.4 C) (12/18 0329) Temp Source: Core (12/17 1600) BP: 119/92 (12/18 0000) Pulse Rate: 87 (12/18 0329)  Labs: Recent Labs    11/23/19 0453 11/24/19 0439 11/24/19 2114 11/25/19 0500 11/25/19 0741 11/25/19 1735 11/26/19 0020 11/26/19 0100 11/26/19 0300  HGB 15.6   < > 14.6 14.7  --   --  13.9  --   --   HCT 48.6   < > 44.5 44.9  --   --  41.0  --   --   PLT 242  --  233 265  --   --   --   --   --   APTT  --   --  147*  --  108* >200*  --   --   --   HEPARINUNFRC  --    < > 0.64  --  0.53 1.18*  --   --  1.00*  CREATININE 1.01  --  1.04 1.17  --   --   --  0.97  --    < > = values in this interval not displayed.    Estimated Creatinine Clearance: 93.7 mL/min (by C-G formula based on SCr of 0.97 mg/dL).   Medical History: Past Medical History:  Diagnosis Date  . Arthritis   . COPD (chronic obstructive pulmonary disease) ([place])   . Essential hypertension   . Type 2 diabetes mellitus [facility])     Assessment: 58 year old male admitted with newly documented cardiomyopathy with paroxysmal atrial fibrillation with RVR. Patient had progressively worsening hypoxia and mental status over the course of the day requiring emergent Impella placement in the OR and intubation. Heparin being started post Impella surgery.   Hgb is stable at 14.7. ACTs have been low this morning, heparin being adjusted per protocol. Now approaching ACT goal currently on 1700 units/hr, last ACT was 158. Purge providing 515 units/hr for total of 2215 units/hr.   Heparin level is now significantly elevated at 1.18 and aPTT >200 (drawn from opposite arm than IV Heparin is infusing). ACT 175. Patient is having minor bleeding from Impella  insertion site and nasal abrasion. Based on high heparin level, aPTT, and minor bleeding, will decrease heparin infusion for now - not holding. RN aware and making change.   12/18 AM update:  Heparin level remains supra-therapeutic, ACT at goal  Goal of Therapy:  Heparin level 0.3-0.7 Aptt 66-102s Target ACT: 160-180 seconds Monitor platelets by anticoagulation protocol: Yes   Plan:  Reduce systemic heparin to 1000 units/hr (RN aware) with elevated HL and minor bleeding.  Heparin off in a few hours for CABG  Narda Bonds, PharmD, BCPS Clinical Pharmacist Phone: [phone]

## 2019-11-26 NOTE — Progress Notes (Signed)
Patient transported from OR to room 2H14 on ventilator and nitric oxide without complications.

## 2019-11-26 NOTE — Transfer of Care (Signed)
Immediate Anesthesia Transfer of Care Note  Patient: Kevin Underwood  Procedure(s) Performed: CORONARY ARTERY BYPASS GRAFTING (CABG) using endoscopic greater saphenous vein harvest: svc to OM; svc to Diag; svc to PD; and LIMA to LAD. (N/A Chest) TRANSESOPHAGEAL ECHOCARDIOGRAM (TEE) (N/A ) INDOCYANINE GREEN FLUORESCENCE IMAGING (ICG) (N/A ) MAZE using Bilateral Pulmonary Vein isolation. (N/A ) Clipping Of Atrial Appendage using AtriCure 40 Clip (N/A Chest)  Patient Location: ICU and SICU  Anesthesia Type:General  Level of Consciousness: sedated and Patient remains intubated per anesthesia plan  Airway & Oxygen Therapy: Patient remains intubated per anesthesia plan and Patient placed on Ventilator (see vital sign flow sheet for setting)  Post-op Assessment: Report given to RN and Post -op Vital signs reviewed and stable on multiple infusions. Dr Orvan Seen at bedside.  Post vital signs: Reviewed and stable  Last Vitals:  Vitals Value Taken Time  BP    Temp    Pulse    Resp    SpO2      Last Pain:  Vitals:   11/26/19 0400  TempSrc:   PainSc: Asleep      Patients Stated Pain Goal: 2 (49/70/26 3785)  Complications: No apparent anesthesia complications

## 2019-11-26 NOTE — Plan of Care (Signed)
  Problem: Coping: Goal: Level of anxiety will decrease Outcome: Progressing   Problem: Elimination: Goal: Will not experience complications related to bowel motility Outcome: Progressing Goal: Will not experience complications related to urinary retention Outcome: Progressing   Problem: Pain Managment: Goal: General experience of comfort will improve Outcome: Progressing   

## 2019-11-27 ENCOUNTER — Inpatient Hospital Stay (HOSPITAL_COMMUNITY): Payer: 59

## 2019-11-27 DIAGNOSIS — I5043 Acute on chronic combined systolic (congestive) and diastolic (congestive) heart failure: Secondary | ICD-10-CM

## 2019-11-27 LAB — CBC
HCT: 25.6 % — ABNORMAL LOW (ref 39.0–52.0)
HCT: 21.7 % — ABNORMAL LOW (ref 39.0–52.0)
Hemoglobin: 8.5 g/dL — ABNORMAL LOW (ref 13.0–17.0)
Hemoglobin: 7.4 g/dL — ABNORMAL LOW (ref 13.0–17.0)
MCH: 27.8 pg (ref 26.0–34.0)
MCH: 28.5 pg (ref 26.0–34.0)
MCHC: 33.2 g/dL (ref 30.0–36.0)
MCHC: 34.1 g/dL (ref 30.0–36.0)
MCV: 83.7 fL (ref 80.0–100.0)
MCV: 83.5 fL (ref 80.0–100.0)
Platelets: 85 10*3/uL — ABNORMAL LOW (ref 150–400)
Platelets: 85 10*3/uL — ABNORMAL LOW (ref 150–400)
RBC: 3.06 MIL/uL — ABNORMAL LOW (ref 4.22–5.81)
RBC: 2.6 MIL/uL — ABNORMAL LOW (ref 4.22–5.81)
RDW: 16 % — ABNORMAL HIGH (ref 11.5–15.5)
RDW: 16.4 % — ABNORMAL HIGH (ref 11.5–15.5)
WBC: 6.9 10*3/uL (ref 4.0–10.5)
WBC: 7.9 10*3/uL (ref 4.0–10.5)
nRBC: 0 % (ref 0.0–0.2)
nRBC: 0 % (ref 0.0–0.2)

## 2019-11-27 LAB — POCT I-STAT 7, (LYTES, BLD GAS, ICA,H+H)
Acid-Base Excess: 4 mmol/L — ABNORMAL HIGH (ref 0.0–2.0)
Acid-Base Excess: 1 mmol/L (ref 0.0–2.0)
Acid-Base Excess: 2 mmol/L (ref 0.0–2.0)
Acid-Base Excess: 2 mmol/L (ref 0.0–2.0)
Bicarbonate: 28.9 mmol/L — ABNORMAL HIGH (ref 20.0–28.0)
Bicarbonate: 26.8 mmol/L (ref 20.0–28.0)
Bicarbonate: 26.7 mmol/L (ref 20.0–28.0)
Bicarbonate: 25.6 mmol/L (ref 20.0–28.0)
Calcium, Ion: 1.08 mmol/L — ABNORMAL LOW (ref 1.15–1.40)
Calcium, Ion: 1.28 mmol/L (ref 1.15–1.40)
Calcium, Ion: 1.19 mmol/L (ref 1.15–1.40)
Calcium, Ion: 1.18 mmol/L (ref 1.15–1.40)
HCT: 23 % — ABNORMAL LOW (ref 39.0–52.0)
HCT: 24 % — ABNORMAL LOW (ref 39.0–52.0)
HCT: 23 % — ABNORMAL LOW (ref 39.0–52.0)
HCT: 27 % — ABNORMAL LOW (ref 39.0–52.0)
Hemoglobin: 7.8 g/dL — ABNORMAL LOW (ref 13.0–17.0)
Hemoglobin: 8.2 g/dL — ABNORMAL LOW (ref 13.0–17.0)
Hemoglobin: 7.8 g/dL — ABNORMAL LOW (ref 13.0–17.0)
Hemoglobin: 9.2 g/dL — ABNORMAL LOW (ref 13.0–17.0)
O2 Saturation: 100 %
O2 Saturation: 98 %
O2 Saturation: 96 %
O2 Saturation: 94 %
Patient temperature: 35.5
Patient temperature: 36.3
Patient temperature: 35.7
Patient temperature: 37.1
Potassium: 3.8 mmol/L (ref 3.5–5.1)
Potassium: 4.2 mmol/L (ref 3.5–5.1)
Potassium: 4.3 mmol/L (ref 3.5–5.1)
Potassium: 4.2 mmol/L (ref 3.5–5.1)
Sodium: 138 mmol/L (ref 135–145)
Sodium: 138 mmol/L (ref 135–145)
Sodium: 138 mmol/L (ref 135–145)
Sodium: 136 mmol/L (ref 135–145)
TCO2: 30 mmol/L (ref 22–32)
TCO2: 28 mmol/L (ref 22–32)
TCO2: 28 mmol/L (ref 22–32)
TCO2: 27 mmol/L (ref 22–32)
pCO2 arterial: 41.9 mmHg (ref 32.0–48.0)
pCO2 arterial: 46.4 mmHg (ref 32.0–48.0)
pCO2 arterial: 36.9 mmHg (ref 32.0–48.0)
pCO2 arterial: 35.2 mmHg (ref 32.0–48.0)
pH, Arterial: 7.44 (ref 7.350–7.450)
pH, Arterial: 7.366 (ref 7.350–7.450)
pH, Arterial: 7.462 — ABNORMAL HIGH (ref 7.350–7.450)
pH, Arterial: 7.471 — ABNORMAL HIGH (ref 7.350–7.450)
pO2, Arterial: 190 mmHg — ABNORMAL HIGH (ref 83.0–108.0)
pO2, Arterial: 104 mmHg (ref 83.0–108.0)
pO2, Arterial: 71 mmHg — ABNORMAL LOW (ref 83.0–108.0)
pO2, Arterial: 67 mmHg — ABNORMAL LOW (ref 83.0–108.0)

## 2019-11-27 LAB — POCT ACTIVATED CLOTTING TIME
Activated Clotting Time: 131 seconds
Activated Clotting Time: 131 seconds
Activated Clotting Time: 136 seconds
Activated Clotting Time: 136 seconds
Activated Clotting Time: 131 seconds
Activated Clotting Time: 136 seconds
Activated Clotting Time: 136 seconds
Activated Clotting Time: 142 seconds
Activated Clotting Time: 142 seconds
Activated Clotting Time: 142 seconds
Activated Clotting Time: 147 seconds
Activated Clotting Time: 153 seconds
Activated Clotting Time: 153 seconds

## 2019-11-27 LAB — BASIC METABOLIC PANEL
Anion gap: 7 (ref 5–15)
Anion gap: 9 (ref 5–15)
BUN: 15 mg/dL (ref 6–20)
BUN: 16 mg/dL (ref 6–20)
CO2: 26 mmol/L (ref 22–32)
CO2: 24 mmol/L (ref 22–32)
Calcium: 7.9 mg/dL — ABNORMAL LOW (ref 8.9–10.3)
Calcium: 7.8 mg/dL — ABNORMAL LOW (ref 8.9–10.3)
Chloride: 104 mmol/L (ref 98–111)
Chloride: 102 mmol/L (ref 98–111)
Creatinine, Ser: 0.82 mg/dL (ref 0.61–1.24)
Creatinine, Ser: 0.97 mg/dL (ref 0.61–1.24)
GFR calc Af Amer: >60 mL/min (ref >60)
GFR calc Af Amer: >60 mL/min (ref >60)
GFR calc non Af Amer: >60 mL/min (ref >60)
GFR calc non Af Amer: >60 mL/min (ref >60)
Glucose, Bld: 121 mg/dL — ABNORMAL HIGH (ref 70–99)
Glucose, Bld: 170 mg/dL — ABNORMAL HIGH (ref 70–99)
Potassium: 4.1 mmol/L (ref 3.5–5.1)
Potassium: 4.3 mmol/L (ref 3.5–5.1)
Sodium: 137 mmol/L (ref 135–145)
Sodium: 135 mmol/L (ref 135–145)

## 2019-11-27 LAB — GLUCOSE, CAPILLARY
Glucose-Capillary: 109 mg/dL — ABNORMAL HIGH (ref 70–99)
Glucose-Capillary: 112 mg/dL — ABNORMAL HIGH (ref 70–99)
Glucose-Capillary: 116 mg/dL — ABNORMAL HIGH (ref 70–99)
Glucose-Capillary: 118 mg/dL — ABNORMAL HIGH (ref 70–99)
Glucose-Capillary: 119 mg/dL — ABNORMAL HIGH (ref 70–99)
Glucose-Capillary: 119 mg/dL — ABNORMAL HIGH (ref 70–99)
Glucose-Capillary: 122 mg/dL — ABNORMAL HIGH (ref 70–99)
Glucose-Capillary: 124 mg/dL — ABNORMAL HIGH (ref 70–99)
Glucose-Capillary: 124 mg/dL — ABNORMAL HIGH (ref 70–99)
Glucose-Capillary: 125 mg/dL — ABNORMAL HIGH (ref 70–99)
Glucose-Capillary: 126 mg/dL — ABNORMAL HIGH (ref 70–99)
Glucose-Capillary: 149 mg/dL — ABNORMAL HIGH (ref 70–99)
Glucose-Capillary: 150 mg/dL — ABNORMAL HIGH (ref 70–99)
Glucose-Capillary: 162 mg/dL — ABNORMAL HIGH (ref 70–99)
Glucose-Capillary: 170 mg/dL — ABNORMAL HIGH (ref 70–99)
Glucose-Capillary: 176 mg/dL — ABNORMAL HIGH (ref 70–99)
Glucose-Capillary: 75 mg/dL (ref 70–99)

## 2019-11-27 LAB — PREPARE RBC (CROSSMATCH)

## 2019-11-27 LAB — COOXEMETRY PANEL
Carboxyhemoglobin: 1.2 % (ref 0.5–1.5)
Carboxyhemoglobin: 1.3 % (ref 0.5–1.5)
Carboxyhemoglobin: 1.8 % — ABNORMAL HIGH (ref 0.5–1.5)
Methemoglobin: 0.7 % (ref 0.0–1.5)
Methemoglobin: 0.9 % (ref 0.0–1.5)
Methemoglobin: 1.4 % (ref 0.0–1.5)
O2 Saturation: 55.9 %
O2 Saturation: 69.4 %
O2 Saturation: 73.6 %
Total hemoglobin: 8.8 g/dL — ABNORMAL LOW (ref 12.0–16.0)
Total hemoglobin: 8.4 g/dL — ABNORMAL LOW (ref 12.0–16.0)
Total hemoglobin: 8.1 g/dL — ABNORMAL LOW (ref 12.0–16.0)

## 2019-11-27 LAB — BPAM PLATELET PHERESIS
Blood Product Expiration Date: 202012192359
Blood Product Expiration Date: 202012192359
ISSUE DATE / TIME: 202012181235
ISSUE DATE / TIME: 202012181235
Unit Type and Rh: 2800
Unit Type and Rh: 2800

## 2019-11-27 LAB — BPAM FFP
Blood Product Expiration Date: 202012232359
Blood Product Expiration Date: 202012232359
Blood Product Expiration Date: 202012232359
Blood Product Expiration Date: 202012232359
ISSUE DATE / TIME: 202012181312
ISSUE DATE / TIME: 202012181314
ISSUE DATE / TIME: 202012181317
ISSUE DATE / TIME: 202012181317
Unit Type and Rh: 1700
Unit Type and Rh: 7300
Unit Type and Rh: 7300
Unit Type and Rh: 7300

## 2019-11-27 LAB — PREPARE PLATELET PHERESIS
Unit division: 0
Unit division: 0

## 2019-11-27 LAB — PREPARE FRESH FROZEN PLASMA
Unit division: 0
Unit division: 0
Unit division: 0
Unit division: 0

## 2019-11-27 LAB — HEPARIN LEVEL (UNFRACTIONATED): Heparin Unfractionated: <0.1 IU/mL — ABNORMAL LOW (ref 0.30–0.70)

## 2019-11-27 LAB — MAGNESIUM
Magnesium: 2.2 mg/dL (ref 1.7–2.4)
Magnesium: 2.2 mg/dL (ref 1.7–2.4)

## 2019-11-27 LAB — BPAM CRYOPRECIPITATE
Blood Product Expiration Date: 202012181828
ISSUE DATE / TIME: 202012181246
Unit Type and Rh: 5100

## 2019-11-27 LAB — PREPARE CRYOPRECIPITATE: Unit division: 0

## 2019-11-27 LAB — ECHO INTRAOPERATIVE TEE
Height: 70 in
Weight: 3033.53 oz

## 2019-11-27 MED ORDER — ACETAMINOPHEN 650 MG RE SUPP
650.0000 mg | Freq: Four times a day (QID) | RECTAL | Status: DC
  Administered 2019-11-27: 13:00:00 650 mg via RECTAL

## 2019-11-27 MED ORDER — ROSUVASTATIN CALCIUM 20 MG PO TABS
40.0000 mg | ORAL_TABLET | Freq: Every day | ORAL | Status: DC
  Administered 2019-11-27 – 2019-11-28 (×2): 40 mg
  Filled 2019-11-27 (×2): qty 2

## 2019-11-27 MED ORDER — ORAL CARE MOUTH RINSE
15.0000 mL | OROMUCOSAL | Status: DC
  Administered 2019-11-27 – 2019-11-29 (×27): 15 mL via OROMUCOSAL

## 2019-11-27 MED ORDER — PANTOPRAZOLE SODIUM 40 MG IV SOLR
40.0000 mg | Freq: Every day | INTRAVENOUS | Status: DC
  Administered 2019-11-27 – 2019-12-07 (×11): 40 mg via INTRAVENOUS
  Filled 2019-11-27 (×11): qty 40

## 2019-11-27 MED ORDER — ROSUVASTATIN CALCIUM 20 MG PO TABS: 40.0000 mg | ORAL_TABLET | Freq: Every day | ORAL | Status: DC

## 2019-11-27 MED ORDER — INSULIN ASPART 100 UNIT/ML ~~LOC~~ SOLN
0.0000 [IU] | SUBCUTANEOUS | Status: DC
  Administered 2019-11-27 (×2): 4 [IU] via SUBCUTANEOUS
  Administered 2019-11-27 – 2019-11-29 (×3): 3 [IU] via SUBCUTANEOUS
  Administered 2019-11-29: 4 [IU] via SUBCUTANEOUS
  Administered 2019-11-29: 3 [IU] via SUBCUTANEOUS
  Administered 2019-11-29 – 2019-11-30 (×2): 4 [IU] via SUBCUTANEOUS
  Administered 2019-11-30: 3 [IU] via SUBCUTANEOUS
  Administered 2019-11-30: 23:00:00 4 [IU] via SUBCUTANEOUS
  Administered 2019-11-30 – 2019-12-01 (×4): 3 [IU] via SUBCUTANEOUS
  Administered 2019-12-01: 20:00:00 4 [IU] via SUBCUTANEOUS
  Administered 2019-12-01 (×2): 3 [IU] via SUBCUTANEOUS
  Administered 2019-12-01: 4 [IU] via SUBCUTANEOUS
  Administered 2019-12-02: 7 [IU] via SUBCUTANEOUS
  Administered 2019-12-02: 3 [IU] via SUBCUTANEOUS
  Administered 2019-12-02 (×2): 7 [IU] via SUBCUTANEOUS
  Administered 2019-12-02: 3 [IU] via SUBCUTANEOUS
  Administered 2019-12-03 (×3): 4 [IU] via SUBCUTANEOUS
  Administered 2019-12-03: 3 [IU] via SUBCUTANEOUS
  Administered 2019-12-03: 7 [IU] via SUBCUTANEOUS
  Administered 2019-12-04: 4 [IU] via SUBCUTANEOUS
  Administered 2019-12-04: 20:00:00 15 [IU] via SUBCUTANEOUS
  Administered 2019-12-04: 4 [IU] via SUBCUTANEOUS
  Administered 2019-12-04: 3 [IU] via SUBCUTANEOUS
  Administered 2019-12-04: 15 [IU] via SUBCUTANEOUS
  Administered 2019-12-04: 11 [IU] via SUBCUTANEOUS
  Administered 2019-12-05 (×4): 4 [IU] via SUBCUTANEOUS
  Administered 2019-12-05: 3 [IU] via SUBCUTANEOUS
  Administered 2019-12-05 – 2019-12-06 (×4): 4 [IU] via SUBCUTANEOUS
  Administered 2019-12-06: 17:00:00 11 [IU] via SUBCUTANEOUS
  Administered 2019-12-06: 05:00:00 4 [IU] via SUBCUTANEOUS
  Administered 2019-12-06: 7 [IU] via SUBCUTANEOUS
  Administered 2019-12-07: 18:00:00 15 [IU] via SUBCUTANEOUS
  Administered 2019-12-07 (×3): 4 [IU] via SUBCUTANEOUS
  Administered 2019-12-07: 7 [IU] via SUBCUTANEOUS
  Administered 2019-12-08 (×2): 4 [IU] via SUBCUTANEOUS

## 2019-11-27 MED ORDER — ACETAMINOPHEN 325 MG PO TABS: 650.0000 mg | ORAL_TABLET | Freq: Four times a day (QID) | ORAL | Status: DC

## 2019-11-27 MED ORDER — CHLORHEXIDINE GLUCONATE 0.12% ORAL RINSE (MEDLINE KIT)
15.0000 mL | Freq: Two times a day (BID) | OROMUCOSAL | Status: DC
  Administered 2019-11-27 – 2019-11-30 (×7): 15 mL via OROMUCOSAL

## 2019-11-27 MED ORDER — INSULIN GLARGINE 100 UNIT/ML ~~LOC~~ SOLN
15.0000 [IU] | Freq: Two times a day (BID) | SUBCUTANEOUS | Status: DC
  Administered 2019-11-27 (×2): 15 [IU] via SUBCUTANEOUS
  Filled 2019-11-27 (×4): qty 0.15

## 2019-11-27 MED ORDER — ASPIRIN 300 MG RE SUPP
300.0000 mg | Freq: Every day | RECTAL | Status: DC
  Administered 2019-11-27: 10:00:00 300 mg via RECTAL
  Filled 2019-11-27: qty 1

## 2019-11-27 MED ORDER — HEPARIN SODIUM (PORCINE) 5000 UNIT/ML IJ SOLN: 50000.0000 [IU] | INTRAVENOUS | Status: DC

## 2019-11-27 MED ORDER — ACETAMINOPHEN 160 MG/5ML PO SOLN
650.0000 mg | Freq: Four times a day (QID) | ORAL | Status: DC
  Administered 2019-11-27 – 2019-11-29 (×8): 650 mg
  Filled 2019-11-27 (×8): qty 20.3

## 2019-11-27 MED ORDER — HEPARIN (PORCINE) 25000 UT/250ML-% IV SOLN
500.0000 [IU]/h | INTRAVENOUS | Status: DC
  Administered 2019-11-27: 200 [IU]/h via INTRAVENOUS
  Filled 2019-11-27: qty 250

## 2019-11-27 MED ORDER — FUROSEMIDE 10 MG/ML IJ SOLN
5.0000 mg/h | INTRAVENOUS | Status: DC
  Administered 2019-11-27: 5 mg/h via INTRAVENOUS
  Filled 2019-11-27: qty 25

## 2019-11-27 MED ORDER — ASPIRIN 81 MG PO CHEW
324.0000 mg | CHEWABLE_TABLET | Freq: Every day | ORAL | Status: DC
  Administered 2019-11-28 – 2019-11-29 (×2): 324 mg
  Filled 2019-11-27 (×3): qty 4

## 2019-11-27 NOTE — Progress Notes (Signed)
1 Day Post-Op Procedure(s) (LRB): CORONARY ARTERY BYPASS GRAFTING (CABG) using endoscopic greater saphenous vein harvest: svc to OM; svc to Diag; svc to PD; and LIMA to LAD. (N/A) TRANSESOPHAGEAL ECHOCARDIOGRAM (TEE) (N/A) INDOCYANINE GREEN FLUORESCENCE IMAGING (ICG) (N/A) MAZE using Bilateral Pulmonary Vein isolation. (N/A) Clipping Of Atrial Appendage using AtriCure 40 Clip (N/A) Subjective: Stable nite stable flow. NSR off pacer post MAZE Decreased chest tube output- will start impella sliding scale heparin Wean off nimbex, leave on vent Objective: Vital signs in last 24 hours: Temp:  [95.5 F (35.3 C)-97.3 F (36.3 C)] 97 F (36.1 C) (12/19 0800) Pulse Rate:  [87-96] 92 (12/19 0833) Cardiac Rhythm: Normal sinus rhythm (12/19 0800) Resp:  [12-21] 18 (12/19 0833) BP: (92-199)/(55-74) 103/71 (12/19 0833) SpO2:  [84 %-100 %] 100 % (12/19 0454) Arterial Line BP: (63-311)/(-17-77) 97/69 (12/19 0800) FiO2 (%):  [50 %-100 %] 50 % (12/19 0833) Weight:  [97 kg] 97 kg (12/19 0447)  Hemodynamic parameters for last 24 hours: PAP: (32-50)/(12-30) 33/13 CVP:  [7 mmHg-18 mmHg] 7 mmHg CO:  [4 L/min-5.3 L/min] 4.3 L/min CI:  [2 L/min/m2-2.6 L/min/m2] 2.1 L/min/m2  Intake/Output from previous day: 12/18 0701 - 12/19 0700 In: 13033.1 [I.V.:7308.2; UJWJX:9147; IV Piggyback:2614.8] Out: 8295 [Urine:1790; Blood:1650; Chest Tube:850] Intake/Output this shift: Total I/O In: 552.1 [I.V.:420.2; Other:31.9; IV Piggyback:100] Out: 348 [Urine:290; Chest Tube:58]       Exam    General- sedated on vent    Neck- no JVD, no cervical adenopathy palpable, no carotid bruit   Lungs- clear without rales, wheezes   Cor- regular rate and rhythm, no murmur , gallop   Abdomen- soft, non-tender   Extremities - warm, non-tender, minimal edema   Neuro- oriented, appropriate, no focal weakness   Lab Results: Recent Labs    11/26/19 2120 11/27/19 0322  WBC 7.2 6.9  HGB 8.1* 8.5*  HCT 24.9* 25.6*  PLT  89* 85*   BMET:  Recent Labs    11/26/19 2120 11/27/19 0322  NA 138 137  K 4.4 4.1  CL 105 104  CO2 25 26  GLUCOSE 136* 121*  BUN 16 15  CREATININE 0.86 0.82  CALCIUM 8.1* 7.9*    PT/INR:  Recent Labs    11/26/19 1455  LABPROT 17.3*  INR 1.4*   ABG    Component Value Date/Time   PHART 7.462 (H) 11/26/2019 2102   HCO3 26.7 11/26/2019 2102   TCO2 28 11/26/2019 2102   ACIDBASEDEF 6.0 (H) 11/24/2019 1718   O2SAT 55.9 11/27/2019 0327   CBG (last 3)  Recent Labs    11/27/19 0632 11/27/19 0753 11/27/19 0907  GLUCAP 124* 124* 126*    Assessment/Plan: S/P Procedure(s) (LRB): CORONARY ARTERY BYPASS GRAFTING (CABG) using endoscopic greater saphenous vein harvest: svc to OM; svc to Diag; svc to PD; and LIMA to LAD. (N/A) TRANSESOPHAGEAL ECHOCARDIOGRAM (TEE) (N/A) INDOCYANINE GREEN FLUORESCENCE IMAGING (ICG) (N/A) MAZE using Bilateral Pulmonary Vein isolation. (N/A) Clipping Of Atrial Appendage using AtriCure 40 Clip (N/A) Mobilize Diuresis start heparin sliding scale   LOS: 7 days    Kevin Underwood 11/27/2019

## 2019-11-27 NOTE — Anesthesia Postprocedure Evaluation (Signed)
Anesthesia Post Note  Patient: Kevin Underwood  Procedure(s) Performed: CORONARY ARTERY BYPASS GRAFTING (CABG) using endoscopic greater saphenous vein harvest: svc to OM; svc to Diag; svc to PD; and LIMA to LAD. (N/A Chest) TRANSESOPHAGEAL ECHOCARDIOGRAM (TEE) (N/A ) INDOCYANINE GREEN FLUORESCENCE IMAGING (ICG) (N/A ) MAZE using Bilateral Pulmonary Vein isolation. (N/A ) Clipping Of Atrial Appendage using AtriCure 40 Clip (N/A Chest)     Patient location during evaluation: SICU Anesthesia Type: General Level of consciousness: sedated Pain management: pain level controlled Vital Signs Assessment: post-procedure vital signs reviewed and stable Respiratory status: patient remains intubated per anesthesia plan Cardiovascular status: stable Postop Assessment: no apparent nausea or vomiting Anesthetic complications: no    Last Vitals:  Vitals:   11/27/19 1200 11/27/19 1208  BP:  (!) 95/58  Pulse: 93 93  Resp: 18 18  Temp: 36.6 C   SpO2: 98% 98%    Last Pain:  Vitals:   11/27/19 1200  TempSrc:   PainSc: 0-No pain                 Ryshawn Sanzone S

## 2019-11-27 NOTE — Progress Notes (Signed)
Nutrition Follow-up  RD working remotely.   DOCUMENTATION CODES:   Not applicable  INTERVENTION:  - when TF becomes appropriate, recommended goal rate of Vital AF 1.2 @ 60 ml/hr with 30 ml prostat BID. - this regimen (does not include kcal from current propofol rate) will provide 1928 kcal (102% estimated kcal need), 138 grams protein, and 1168 ml free water.    NUTRITION DIAGNOSIS:   Inadequate oral intake related to inability to eat as evidenced by NPO status. -ongoing  GOAL:   Patient will meet greater than or equal to 90% of their needs -unable to meet   MONITOR:   Vent status, Labs, Weight trends, Skin  REASON FOR ASSESSMENT:   Ventilator  ASSESSMENT:   Patient is a 58 yo male with recent hospital stay for pneumonia. Hx of HTN, DM-2 (A1C-12.1%), COPD and arthirits. Newly diagnosed cardiomyopathy.  Patient was seen by another RD on 12/14 related to diet education. He was transferred to [facility] from [facility] for R and L heart caths on 12/16. He was intubated on 12/16 in the OR for emergent Impella placement. He was subsequently extubated on 12/17, but was re-intubated 12/18 when he went for CABG x4 and TEE.   Estimated nutrition needs based on weight from yesterday (86 kg) as that weight is consistent with weight since 12/14 and weight today is significantly up with flow sheet documentation indicating deep pitting edema to BLE.  Patient is currently intubated on ventilator support MV: 10.4 L/min Temp (24hrs), Avg:97.2 F (36.2 C), Min:95.5 F (35.3 C), Max:98.4 F (36.9 C) Propofol: 15.48 ml/hr (409 kcal).     Labs reviewed; CBGs: 125, 150, 119, 122 mg/dl. Medications reviewed; Drips; amiodarone @ 30 mg/hr, epi @ 5 mcg/min, fentanyl @ 150 mcg/hr, lasix @ 5 mg/hr, heparin @ 500 units/hr, levo @ 18 mcg/min, milrinone @ 0.25 mcg/kg/min, propofol @ 30 mcg/kg/min, dopamine @ 2.5 mcg/kg/min.     NUTRITION - FOCUSED PHYSICAL EXAM:  unable to complete at this time.    Diet Order:   Diet Order            Diet NPO time specified  Diet effective now              EDUCATION NEEDS:   Education needs have been addressed  Skin:  Skin Assessment: Skin Integrity Issues: Skin Integrity Issues:: Incisions Incisions: R axilla (12/16); R leg and abdomen (12/18)  Last BM:  12/15  Height:   Ht Readings from Last 1 Encounters:  11/24/19 5\' 10"  (1.778 m)    Weight:   Wt Readings from Last 1 Encounters:  11/27/19 97 kg    Ideal Body Weight:  75.4 kg  BMI:  Body mass index is 30.68 kg/m.  Estimated Nutritional Needs:   Kcal:  1895 kcal  Protein:  120-138 grams  Fluid:  >/= 2 L/day      Jarome Matin, MS, RD, LDN, [facility] Inpatient Clinical Dietitian Pager # [phone] After hours/weekend pager # [phone]

## 2019-11-27 NOTE — Plan of Care (Signed)
  Problem: Education: Goal: Knowledge of General Education information will improve Description: Including pain rating scale, medication(s)/side effects and non-pharmacologic comfort measures Outcome: Progressing   Problem: Clinical Measurements: Goal: Ability to maintain clinical measurements within normal limits will improve Outcome: Progressing Goal: Diagnostic test results will improve Outcome: Progressing Goal: Cardiovascular complication will be avoided Outcome: Progressing   Problem: Pain Managment: Goal: General experience of comfort will improve Outcome: Progressing   Problem: Cardiac: Goal: Ability to achieve and maintain adequate cardiopulmonary perfusion will improve Outcome: Progressing

## 2019-11-27 NOTE — Progress Notes (Signed)
Patient ID: Kevin Underwood, male   DOB: 03-29-1961, 58 y.o.   MRN: [id number]     Advanced Heart Failure Rounding Note  PCP-Cardiologist: Rozann Lesches, MD   Subjective:    - Impella 5.5 placed 12/16 due to cardiogenic shock.  - CABG x 4 + Maze 12/18  This morning, he is on amiodarone 30, dopamine 2.5, epinephrine 5, NE 12, milrinone 0.25, vasopressin 0.04.  MAP stable currently, creatinine 0.82.    He has remained in NSR on amiodarone gtt.    Impella: Flow 4.1 L/min at P8, good waveforms.   Swan numbers: CVP 11-12 PA 36/15 CI 2.2 Co-ox 56%  LHC/RHC: Coronary Findings  Diagnostic Dominance: Right Left Main  30% distal LM stenosis.  Left Anterior Descending  Heavily calcified proximal and mid LAD. There is 95% stenosis in the proximal LAD just after take-off of a high D1. D1 has moderate diffuse disease.  Left Circumflex  Long 80% mid LCx stenosis proximal to PLOM.  Right Coronary Artery  RCA with 50% proximal stenosis, 30% mid stenosis. PDA with 75% proximal stenosis.  Intervention  No interventions have been documented. Right Heart  Right Heart Pressures RHC Procedural Findings: Hemodynamics (mmHg) RA mean 13 RV 45/18 PA 53/22, mean 39 PCWP mean 24 LV 106/28 AO 99/66  Oxygen saturations: PA 53% AO 97%  Cardiac Output (Fick) 2.92  Cardiac Index (Fick) 1.42 PVR 5.1 WU PAPI 2.4      Objective:   Weight Range: 97 kg Body mass index is 30.68 kg/m.   Vital Signs:   Temp:  [95.5 F (35.3 C)-97.3 F (36.3 C)] 96.8 F (36 C) (12/19 0730) Pulse Rate:  [87-96] 90 (12/19 0730) Resp:  [12-21] 18 (12/19 0730) BP: (92-199)/(55-74) 199/74 (12/18 1605) SpO2:  [84 %-100 %] 100 % (12/19 0730) Arterial Line BP: (63-311)/(-17-77) 108/68 (12/19 0730) FiO2 (%):  [50 %-100 %] 60 % (12/19 0331) Weight:  [97 kg] 97 kg (12/19 0447) Last BM Date: 11/23/19  Weight change: Filed Weights   11/25/19 0410 11/26/19 0549 11/27/19 0447  Weight: 90.1 kg 86 kg 97  kg    Intake/Output:   Intake/Output Summary (Last 24 hours) at 11/27/2019 0746 Last data filed at 11/27/2019 0700 Gross per 24 hour  Intake 12783.05 ml  Output 4290 ml  Net 8493.05 ml      Physical Exam    General: Sedated/intubated.  Neck: JVP 8-9, no thyromegaly or thyroid nodule.  Lungs: Decreased at bases.  CV: Nondisplaced PMI.  Heart regular S1/S2, no S3/S4, no murmur.  1+ ankle edema.  Abdomen: Soft, nontender, no hepatosplenomegaly, no distention.  Skin: Intact without lesions or rashes.  Neurologic: Sedated Extremities: No clubbing or cyanosis.  HEENT: Normal.     Telemetry   NSR 90s (personally reviewed)  Labs    CBC Recent Labs    11/26/19 2120 11/27/19 0322  WBC 7.2 6.9  HGB 8.1* 8.5*  HCT 24.9* 25.6*  MCV 85.3 83.7  PLT 89* 85*   Basic Metabolic Panel Recent Labs    11/26/19 2120 11/27/19 0322  NA 138 137  K 4.4 4.1  CL 105 104  CO2 25 26  GLUCOSE 136* 121*  BUN 16 15  CREATININE 0.86 0.82  CALCIUM 8.1* 7.9*  MG 2.5* 2.2   Liver Function Tests Recent Labs    11/24/19 2114 11/26/19 2120  AST 76* 50*  ALT 111* 31  ALKPHOS 92 42  BILITOT 0.5 1.3*  PROT 4.6* 4.4*  ALBUMIN 2.6* 2.8*  No results for input(s): LIPASE, AMYLASE in the last 72 hours. Cardiac Enzymes No results for input(s): CKTOTAL, CKMB, CKMBINDEX, TROPONINI in the last 72 hours.  BNP: BNP (last 3 results) Recent Labs    11/22/19 0414 11/23/19 0453 11/25/19 0500  BNP 578.0* 1,054.0* 596.1*    ProBNP (last 3 results) No results for input(s): PROBNP in the last 8760 hours.   D-Dimer No results for input(s): DDIMER in the last 72 hours. Hemoglobin A1C No results for input(s): HGBA1C in the last 72 hours. Fasting Lipid Panel Recent Labs    11/26/19 2120  TRIG 44   Thyroid Function Tests No results for input(s): TSH, T4TOTAL, T3FREE, THYROIDAB in the last 72 hours.  Invalid input(s): FREET3  Other results:   Imaging    DG Chest Port 1  View  Result Date: 11/26/2019 CLINICAL DATA:  CABG. EXAM: PORTABLE CHEST 1 VIEW COMPARISON:  Chest x-ray 11/26/2019. FINDINGS: Endotracheal tube tip noted 4 cm above the carina. NG tube tip noted below left hemidiaphragm. Swan-Ganz catheter noted with tip over pulmonary artery in stable position. Impella device noted in stable position. Right chest tube and mediastinal drainage catheters in good anatomic position. Interim CABG. Left atrial appendage clip noted. Cardiomegaly with mild interstitial prominence. No focal alveolar infiltrate. No pleural effusion. No pneumothorax. IMPRESSION: 1. Patient status post CABG. Cardiomegaly noted with mild bilateral interstitial prominence. Mild interstitial edema cannot be excluded. 2. Lines and tubes including right chest tube in good anatomic position. Impella device in stable position. Electronically Signed   By: Marcello Moores  Register   On: 11/26/2019 16:06     Medications:     Scheduled Medications: . acetaminophen  1,000 mg Oral Q6H   Or  . acetaminophen (TYLENOL) oral liquid 160 mg/5 mL  1,000 mg Per Tube Q6H  . artificial tears  1 application Both Eyes [id number]  . aspirin EC  325 mg Oral Daily   Or  . aspirin  324 mg Per Tube Daily  . bisacodyl  10 mg Oral Daily   Or  . bisacodyl  10 mg Rectal Daily  . chlorhexidine gluconate (MEDLINE KIT)  15 mL Mouth Rinse BID  . Chlorhexidine Gluconate Cloth  6 each Topical Daily  . docusate sodium  200 mg Oral Daily  . fentaNYL (SUBLIMAZE) injection  50 mcg Intravenous Once  . mouth rinse  15 mL Mouth Rinse 10 times per day  . [START ON 11/28/2019] pantoprazole  40 mg Oral Daily  . rosuvastatin  40 mg Oral q1800  . sodium chloride flush  3 mL Intravenous Q12H    Infusions: . sodium chloride Stopped (11/26/19 1843)  . sodium chloride    . sodium chloride 20 mL/hr at 11/26/19 1445  . albumin human Stopped (11/26/19 2357)  . amiodarone 30 mg/hr (11/27/19 0700)  . cefUROXime (ZINACEF)  IV Stopped (11/26/19  2102)  . cisatracurium (NIMBEX) infusion 2.5 mcg/kg/min (11/27/19 0700)  . dexmedetomidine (PRECEDEX) IV infusion Stopped (11/26/19 1830)  . DOPamine 2.5 mcg/kg/min (11/27/19 0700)  . epinephrine 5 mcg/min (11/27/19 0700)  . fentaNYL infusion INTRAVENOUS 150 mcg/hr (11/27/19 0700)  . furosemide (LASIX) infusion    . impella catheter heparin 50 unit/mL in dextrose 5%    . insulin 0.9 mL/hr at 11/27/19 0700  . lactated ringers    . lactated ringers    . lactated ringers 20 mL/hr at 11/27/19 0742  . milrinone 0.25 mcg/kg/min (11/27/19 0700)  . nitroGLYCERIN    . norepinephrine (LEVOPHED) Adult infusion 12 mcg/min (  11/27/19 0700)  . phenylephrine (NEO-SYNEPHRINE) Adult infusion    . propofol (DIPRIVAN) infusion 30 mcg/kg/min (11/27/19 0700)  . vasopressin (PITRESSIN) infusion - *FOR SHOCK* 0.04 Units/min (11/27/19 0700)    PRN Medications: sodium chloride, albumin human, dextrose, fentaNYL, lactated ringers, metoprolol tartrate, midazolam, morphine injection, ondansetron (ZOFRAN) IV, oxyCODONE, sodium chloride flush, traMADol    Assessment/Plan   1. Acute systolic CHF => cardiogenic shock:  Echo showed EF < 20% with mildly decreased RV systolic function. He was admitted with afib/RVR and CHF.  Cath with elevated filing pressures, low CO (CI 1.4 by RHC), and severe 3 vessel coronary disease.  Ischemic cardiomyopathy, possibly contribution from tachycardia with previous atrial fibrillation. Impella 5.5 placed due to cardiogenic shock on 12/16.  CABG on 12/18 with Maze.  This morning, on P8 with good flow.  He is on dopamine 2.5, epinephrine 5, milrinone 0.25, norepinephrine 12, vasopressin 0.04.  MAP stable with CI 2.2 this morning.  CVP around 11-12, I/Os very positive with surgery.  Creatinine 0.82.  - Wean pressors as able, start with vasopressin.  - Continue current Impella speed.  - Start Lasix gtt 5 mg/hr 2. Atrial fibrillation: With RVR at admission, back in NSR now.  Maze with  surgery.  - Restart anticoagulation post-op when deemed stable by surgeon.  - Continue amiodarone gtt.  3. CAD: Severe 3VD in diabetic smoker.  He has exertional chest pain at baseline.  Was not admitted with ACS. Now s/p CABG.  - Continue ASA 81 daily.  - rosuvastatin 40 daily.    4. 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  Suspect group 2 + group 3 (COPD). Diuresis will be main treatment. PA pressures have come down with diuresis.  5. Diabetes: Poor control.  Per IM, would eventually benefit from SGLT2 inhibitor.  6. OSA: Strongly suspected.  7. COPD: Suspected, former smoker.  Emphysema seen on CT chest.  8. ID: Afebrile, WBCs not elevated.  9. Acute hypoxemic respiratory failure: Intubated post-CABG.   - Start Lasix gtt today.  10. Anemia: Post-op. hgb 8.5 today.  11. Thrombocytopenia: plts 85 today, suspect post-op.   CRITICAL CARE Performed by: Loralie Champagne  Total critical care time: 40 minutes  Critical care time was exclusive of separately billable procedures and treating other patients.  Critical care was necessary to treat or prevent imminent or life-threatening deterioration.  Critical care was time spent personally by me on the following activities: development of treatment plan with patient and/or surrogate as well as nursing, discussions with consultants, evaluation of patient's response to treatment, examination of patient, obtaining history from patient or surrogate, ordering and performing treatments and interventions, ordering and review of laboratory studies, ordering and review of radiographic studies, pulse oximetry and re-evaluation of patient's condition.   Length of Stay: 7  Loralie Champagne, MD  11/27/2019, 7:46 AM  Advanced Heart Failure Team Pager [phone] (M-F; 7a - 4p)  Please contact [facility] for night-coverage after hours (4p -7a ) and weekends on [email or web address]

## 2019-11-27 NOTE — Progress Notes (Addendum)
CT surgery p.m. Rounds  Stable day maintaining sinus rhythm with good LVAD flow Weaning pressors slowly weaning nitric oxide to 5 ppm P.m. hemoglobin 7.3-2 units packed cells ordered We will leave nitric oxide at a low level to assist RV function Good spontaneous urine output

## 2019-11-27 NOTE — Progress Notes (Signed)
PULMONARY / CRITICAL CARE MEDICINE   NAME:  Kevin Underwood, MRN:  [id number], DOB:  February 20, 1961, LOS: 7 ADMISSION DATE:  11/20/2019, CONSULTATION DATE:  11/27/2019  REFERRING MD:  Shirlee Latch, MD, CHIEF COMPLAINT: Respiratory distress requiring BiPAP  BRIEF HISTORY:    57 year old smoker, diabetic, hypertensive admitted 12/12 for atrial fibrillation and congestive heart failure, found to have severely reduced EF less than 20% with global hypokinesis.  Cardiac-cath showed severe three-vessel disease.  He developed cardiogenic shock and respiratory distress requiring BiPAP 12/16 emergent placement of Impella LVAD    SIGNIFICANT PAST MEDICAL HISTORY    Past Medical History:  Diagnosis Date  . Arthritis   . COPD (chronic obstructive pulmonary disease) (HCC)   . Essential hypertension   . Type 2 diabetes mellitus (HCC)      SIGNIFICANT EVENTS:  12/16 R/LHC 12/16 to OR for impella  STUDIES:   12/12 CT angiogram negative for PE 12/13 echo EF less than 20%, global hypokinesis, RVSP 50 12/16 R/LHC >> severe three-vessel disease with proximal LAD stenosis Impella 5.5 placed 12/16 due to cardiogenic shock.    12/17 - Critically ill, intubated On epinephrine, Levophed, milrinone and heparin drips Propofol turned off this morning  12/18 - CABG x 4 + Maze 12/18  CULTURES:    ANTIBIOTICS:    LINES/TUBES:  12/16 RUE PICC >> 12/16 RIJ swan >>  CONSULTANTS:  TCTS CHF SUBJECTIVE:   11/27/2019 - 50% fio2 on vent. Sedated with diprivan gtt, fent gtt and paralyzed with nimbex. On epig gtt dopamine gtt, levophed gtt, milrinon gtt, lasix gtt, heparin gtt, On Impella  CONSTITUTIONAL: BP (!) 95/58   Pulse 93   Temp 97.9 F (36.6 C)   Resp 18   Ht 5\' 10"  (1.778 m)   Wt 97 kg   SpO2 98%   BMI 30.68 kg/m   I/O last 3 completed shifts: In: 13717.4 [I.V.:7863.7; Blood:2937; Other:301.9; IV Piggyback:2614.8] Out: 7970 [Urine:5470; Blood:1650; Chest Tube:850]  PAP: (32-50)/(11-30)  35/12 CVP:  [5 mmHg-18 mmHg] 7 mmHg CO:  [4 L/min-5.3 L/min] 4.3 L/min CI:  [2 L/min/m2-2.6 L/min/m2] 2.1 L/min/m2  Vent Mode: PRVC FiO2 (%):  [50 %-100 %] 50 % Set Rate:  [12 bmp-20 bmp] 18 bmp Vt Set:  [580 mL] 580 mL PEEP:  [5 cmH20-7 cmH20] 7 cmH20 Pressure Support:  [10 cmH20] 10 cmH20 Plateau Pressure:  [22 cmH20-23 cmH20] 23 cmH20 General Appearance:  Looks criticall ill Head:  Normocephalic, without obvious abnormality, atraumatic Eyes:  PERRL - yes, conjunctiva/corneas - muddy     Ears:  Normal external ear canals, both ears Nose:  G tube - no Throat:  ETT TUBE - yes , OG tube - yes Neck:  Supple,  No enlargement/tenderness/nodules Lungs: Clear to auscultation bilaterally, Ventilator   Synchrony - yes  Heart:  S1 and S2 normal, no murmur, CVP - x.  Pressors - yes Abdomen:  Soft, no masses, no organomegaly Genitalia / Rectal:  Not done Extremities:  Extremities- intact Skin:  ntact in exposed areas . Sacral area - not examined Neurologic:  Sedation - deep sedation and paralysius -> RASS - -5      RESOLVED PROBLEM LIST   ASSESSMENT AND PLAN    Acute resp hypoxemic failure due to cardiogenic shock - s/P CABG with maze 11/26/2019  11/27/2019 - > does not meet criteria for SBT/Extubation in setting of Acute Respiratory Failure due to cardiogenic shock  Plan  - continue full vent support  - continue sedation with diprivan, fent gtt  and nimbex (d/w Dr Vickey Sages)  - shock mgmt per cvts and cards    Hyperglycemia  Plan  - ssi  SUMMARY OF TODAY'S PLAN:  To OR emergently  Best Practice / Goals of Care / Disposition.   DVT PROPHYLAXIS: IV heparin SUP: Protonix NUTRITION: N.p.o. MOBILITY: Bedrest GOALS OF CARE: Aggressive care for now FAMILY DISCUSSIONS: per CVTS DISPOSITION ICU   ATTESTATION & SIGNATURE   The patient Kevin Underwood is critically ill with multiple organ systems failure and requires high complexity decision making for assessment and  support, frequent evaluation and titration of therapies, application of advanced monitoring technologies and extensive interpretation of multiple databases.   Critical Care Time devoted to patient care services described in this note is  30  Minutes. This time reflects time of care of this signee Dr Kalman Shan. This critical care time does not reflect procedure time, or teaching time or supervisory time of PA/NP/Med student/Med Resident etc but could involve care discussion time     Dr. Kalman Shan, M.D., [facility].C.P Pulmonary and Critical Care Medicine Staff Physician [facility] Pager: [phone], If no answer or between  15:00h - 7:00h: call [phone]  11/27/2019 12:23 PM    LABS    PULMONARY Recent Labs  Lab 11/26/19 1007 11/26/19 1327 11/26/19 1340 11/26/19 1500 11/26/19 1922 11/26/19 2102 11/27/19 0327 11/27/19 1058  PHART 7.416 7.329*  --  7.440 7.366 7.462*  --   --   PCO2ART 44.8 50.1*  --  41.9 46.4 36.9  --   --   PO2ART 377.0* 168.0*  --  190.0* 104.0 71.0*  --   --   HCO3 28.7* 26.3  --  28.9* 26.8 26.7  --   --   TCO2 30 28 26 30 28 28   --   --   O2SAT 100.0 99.0  --  100.0 98.0 96.0 55.9 69.4    CBC Recent Labs  Lab 11/26/19 1455 11/26/19 2102 11/26/19 2120 11/27/19 0322  HGB 8.2* 7.8* 8.1* 8.5*  HCT 24.9* 23.0* 24.9* 25.6*  WBC 11.0*  --  7.2 6.9  PLT 99*  --  89* 85*    COAGULATION Recent Labs  Lab 11/26/19 1455  INR 1.4*    CARDIAC  No results for input(s): TROPONINI in the last 168 hours. No results for input(s): PROBNP in the last 168 hours.   CHEMISTRY Recent Labs  Lab 11/24/19 2114 11/25/19 0500 11/26/19 0100 11/26/19 0513 11/26/19 1224 11/26/19 1239 11/26/19 1340 11/26/19 1500 11/26/19 1922 11/26/19 2102 11/26/19 2120 11/27/19 0322  NA 136 136 133* 135 135 134* 137 138 138 138 138 137  K 3.8 4.3 3.7 3.4* 4.4 4.7 3.9 3.8 4.2 4.3 4.4 4.1  CL 104 102 99 98 97*  98 100  --   --   --  105 104  CO2 24 24 26 29   --   --   --   --   --   --  25 26  GLUCOSE 278* 227* 157* 120* 160* 172* 142*  --   --   --  136* 121*  BUN 29* 27* 22* 18 17 17 18   --   --   --  16 15  CREATININE 1.04 1.17 0.97 0.95 0.80 0.80 0.70  --   --   --  0.86 0.82  CALCIUM 8.1* 8.0* 7.7* 7.8*  --   --   --   --   --   --  8.1* 7.9*  MG 1.7 2.2 2.0  --   --   --   --   --   --   --  2.5* 2.2   Estimated Creatinine Clearance: 114.7 mL/min (by C-G formula based on SCr of 0.82 mg/dL).   LIVER Recent Labs  Lab 11/24/19 2114 11/26/19 1455 11/26/19 2120  AST 76*  --  50*  ALT 111*  --  31  ALKPHOS 92  --  42  BILITOT 0.5  --  1.3*  PROT 4.6*  --  4.4*  ALBUMIN 2.6*  --  2.8*  INR  --  1.4*  --      INFECTIOUS Recent Labs  Lab 11/24/19 2114 11/25/19 0741  LATICACIDVEN 2.0* 1.9     ENDOCRINE CBG (last 3)  Recent Labs    11/27/19 1013 11/27/19 1054 11/27/19 1217  GLUCAP 125* 150* 119*         IMAGING x48h  - image(s) personally visualized  -   highlighted in bold DG Chest Port 1 View  Result Date: 11/27/2019 CLINICAL DATA:  Status post CABG. EXAM: PORTABLE CHEST 1 VIEW COMPARISON:  Chest x-ray from yesterday. FINDINGS: Unchanged endotracheal and enteric tubes. Unchanged Swan-Ganz catheter and right upper extremity PICC line. Unchanged Impella device. Unchanged bilateral chest tubes and mediastinal drains. Stable cardiomediastinal silhouette status post CABG and left atrial appendage clipping. Unchanged pulmonary vascular congestion, small bilateral pleural effusions, and bibasilar atelectasis, greater on the left. No pneumothorax. No acute osseous abnormality. IMPRESSION: 1. Stable support lines and tubes status post CABG. No pneumothorax. 2. Unchanged pulmonary vascular congestion, small bilateral pleural effusions, and bibasilar atelectasis. Electronically Signed   By: Titus Dubin M.D.   On: 11/27/2019 08:32   DG Chest Port 1 View  Result Date:  11/26/2019 CLINICAL DATA:  CABG. EXAM: PORTABLE CHEST 1 VIEW COMPARISON:  Chest x-ray 11/26/2019. FINDINGS: Endotracheal tube tip noted 4 cm above the carina. NG tube tip noted below left hemidiaphragm. Swan-Ganz catheter noted with tip over pulmonary artery in stable position. Impella device noted in stable position. Right chest tube and mediastinal drainage catheters in good anatomic position. Interim CABG. Left atrial appendage clip noted. Cardiomegaly with mild interstitial prominence. No focal alveolar infiltrate. No pleural effusion. No pneumothorax. IMPRESSION: 1. Patient status post CABG. Cardiomegaly noted with mild bilateral interstitial prominence. Mild interstitial edema cannot be excluded. 2. Lines and tubes including right chest tube in good anatomic position. Impella device in stable position. Electronically Signed   By: Marcello Moores  Register   On: 11/26/2019 16:06   DG CHEST PORT 1 VIEW  Result Date: 11/26/2019 CLINICAL DATA:  Placement of Impella catheter. EXAM: PORTABLE CHEST 1 VIEW COMPARISON:  One-view chest x-ray 11/25/2019. FINDINGS: Impella left ventricular assist device is stable in position, projecting over the left ventricle. The patient has been extubated. NG tube was removed. Swan-Ganz catheter is stable in position, in the proximal right pulmonary artery. The heart is enlarged. Atherosclerotic calcifications are again noted at the aortic arch. Aeration is improved. Pulmonary vascular congestion persist. IMPRESSION: 1. Stable position of Impella left ventricular assist device. 2. Stable cardiomegaly and pulmonary vascular congestion. 3. Improved aeration. 4. Stable Swan-Ganz catheter. Electronically Signed   By: San Morelle M.D.   On: 11/26/2019 07:59

## 2019-11-28 ENCOUNTER — Inpatient Hospital Stay (HOSPITAL_COMMUNITY): Payer: 59

## 2019-11-28 LAB — POCT I-STAT 7, (LYTES, BLD GAS, ICA,H+H)
Acid-Base Excess: 4 mmol/L — ABNORMAL HIGH (ref 0.0–2.0)
Acid-Base Excess: 4 mmol/L — ABNORMAL HIGH (ref 0.0–2.0)
Bicarbonate: 28.4 mmol/L — ABNORMAL HIGH (ref 20.0–28.0)
Bicarbonate: 28.3 mmol/L — ABNORMAL HIGH (ref 20.0–28.0)
Calcium, Ion: 1.13 mmol/L — ABNORMAL LOW (ref 1.15–1.40)
Calcium, Ion: 1.17 mmol/L (ref 1.15–1.40)
HCT: 25 % — ABNORMAL LOW (ref 39.0–52.0)
HCT: 21 % — ABNORMAL LOW (ref 39.0–52.0)
Hemoglobin: 8.5 g/dL — ABNORMAL LOW (ref 13.0–17.0)
Hemoglobin: 7.1 g/dL — ABNORMAL LOW (ref 13.0–17.0)
O2 Saturation: 95 %
O2 Saturation: 98 %
Patient temperature: 36.8
Patient temperature: 36.9
Potassium: 3.7 mmol/L (ref 3.5–5.1)
Potassium: 3.7 mmol/L (ref 3.5–5.1)
Sodium: 138 mmol/L (ref 135–145)
Sodium: 137 mmol/L (ref 135–145)
TCO2: 30 mmol/L (ref 22–32)
TCO2: 30 mmol/L (ref 22–32)
pCO2 arterial: 40.6 mmHg (ref 32.0–48.0)
pCO2 arterial: 41.9 mmHg (ref 32.0–48.0)
pH, Arterial: 7.452 — ABNORMAL HIGH (ref 7.350–7.450)
pH, Arterial: 7.437 (ref 7.350–7.450)
pO2, Arterial: 73 mmHg — ABNORMAL LOW (ref 83.0–108.0)
pO2, Arterial: 108 mmHg (ref 83.0–108.0)

## 2019-11-28 LAB — COOXEMETRY PANEL
Carboxyhemoglobin: 2.7 % — ABNORMAL HIGH (ref 0.5–1.5)
Carboxyhemoglobin: 1.8 % — ABNORMAL HIGH (ref 0.5–1.5)
Methemoglobin: 1.3 % (ref 0.0–1.5)
Methemoglobin: 1.5 % (ref 0.0–1.5)
O2 Saturation: 63.7 %
O2 Saturation: 67.8 %
Total hemoglobin: 9.3 g/dL — ABNORMAL LOW (ref 12.0–16.0)
Total hemoglobin: 9.5 g/dL — ABNORMAL LOW (ref 12.0–16.0)

## 2019-11-28 LAB — TYPE AND SCREEN
ABO/RH(D): B POS
Antibody Screen: NEGATIVE
Unit division: 0
Unit division: 0
Unit division: 0
Unit division: 0
Unit division: 0
Unit division: 0
Unit division: 0
Unit division: 0

## 2019-11-28 LAB — HEPARIN LEVEL (UNFRACTIONATED): Heparin Unfractionated: 0.68 IU/mL (ref 0.30–0.70)

## 2019-11-28 LAB — CBC
HCT: 26.2 % — ABNORMAL LOW (ref 39.0–52.0)
HCT: 21.7 % — ABNORMAL LOW (ref 39.0–52.0)
HCT: 25.1 % — ABNORMAL LOW (ref 39.0–52.0)
Hemoglobin: 8.7 g/dL — ABNORMAL LOW (ref 13.0–17.0)
Hemoglobin: 7.4 g/dL — ABNORMAL LOW (ref 13.0–17.0)
Hemoglobin: 8.7 g/dL — ABNORMAL LOW (ref 13.0–17.0)
MCH: 28 pg (ref 26.0–34.0)
MCH: 28.1 pg (ref 26.0–34.0)
MCH: 29.1 pg (ref 26.0–34.0)
MCHC: 33.2 g/dL (ref 30.0–36.0)
MCHC: 34.1 g/dL (ref 30.0–36.0)
MCHC: 34.7 g/dL (ref 30.0–36.0)
MCV: 84.2 fL (ref 80.0–100.0)
MCV: 82.5 fL (ref 80.0–100.0)
MCV: 83.9 fL (ref 80.0–100.0)
Platelets: 87 10*3/uL — ABNORMAL LOW (ref 150–400)
Platelets: 81 10*3/uL — ABNORMAL LOW (ref 150–400)
Platelets: 74 10*3/uL — ABNORMAL LOW (ref 150–400)
RBC: 3.11 MIL/uL — ABNORMAL LOW (ref 4.22–5.81)
RBC: 2.63 MIL/uL — ABNORMAL LOW (ref 4.22–5.81)
RBC: 2.99 MIL/uL — ABNORMAL LOW (ref 4.22–5.81)
RDW: 17.2 % — ABNORMAL HIGH (ref 11.5–15.5)
RDW: 17 % — ABNORMAL HIGH (ref 11.5–15.5)
RDW: 15.9 % — ABNORMAL HIGH (ref 11.5–15.5)
WBC: 11.7 10*3/uL — ABNORMAL HIGH (ref 4.0–10.5)
WBC: 10.9 10*3/uL — ABNORMAL HIGH (ref 4.0–10.5)
WBC: 10.8 10*3/uL — ABNORMAL HIGH (ref 4.0–10.5)
nRBC: 0 % (ref 0.0–0.2)
nRBC: 0 % (ref 0.0–0.2)
nRBC: 0 % (ref 0.0–0.2)

## 2019-11-28 LAB — BPAM RBC
Blood Product Expiration Date: 202012242359
Blood Product Expiration Date: 202012262359
Blood Product Expiration Date: 202101102359
Blood Product Expiration Date: 202101152359
Blood Product Expiration Date: 202101152359
Blood Product Expiration Date: 202101152359
Blood Product Expiration Date: 202101152359
Blood Product Expiration Date: 202101152359
ISSUE DATE / TIME: 202012181018
ISSUE DATE / TIME: 202012181018
ISSUE DATE / TIME: 202012181603
ISSUE DATE / TIME: 202012181713
ISSUE DATE / TIME: 202012182154
ISSUE DATE / TIME: 202012191821
ISSUE DATE / TIME: 202012192011
ISSUE DATE / TIME: 202012192339
Unit Type and Rh: 7300
Unit Type and Rh: 7300
Unit Type and Rh: 7300
Unit Type and Rh: 7300
Unit Type and Rh: 7300
Unit Type and Rh: 7300
Unit Type and Rh: 7300
Unit Type and Rh: 7300

## 2019-11-28 LAB — POCT ACTIVATED CLOTTING TIME
Activated Clotting Time: 153 seconds
Activated Clotting Time: 158 seconds
Activated Clotting Time: 158 seconds
Activated Clotting Time: 164 seconds
Activated Clotting Time: 169 seconds
Activated Clotting Time: 169 seconds
Activated Clotting Time: 175 seconds
Activated Clotting Time: 125 seconds

## 2019-11-28 LAB — BASIC METABOLIC PANEL
Anion gap: 8 (ref 5–15)
Anion gap: 6 (ref 5–15)
BUN: 16 mg/dL (ref 6–20)
BUN: 14 mg/dL (ref 6–20)
CO2: 27 mmol/L (ref 22–32)
CO2: 26 mmol/L (ref 22–32)
Calcium: 7.7 mg/dL — ABNORMAL LOW (ref 8.9–10.3)
Calcium: 7.7 mg/dL — ABNORMAL LOW (ref 8.9–10.3)
Chloride: 102 mmol/L (ref 98–111)
Chloride: 104 mmol/L (ref 98–111)
Creatinine, Ser: 0.87 mg/dL (ref 0.61–1.24)
Creatinine, Ser: 0.73 mg/dL (ref 0.61–1.24)
GFR calc Af Amer: >60 mL/min (ref >60)
GFR calc Af Amer: >60 mL/min (ref >60)
GFR calc non Af Amer: >60 mL/min (ref >60)
GFR calc non Af Amer: >60 mL/min (ref >60)
Glucose, Bld: 132 mg/dL — ABNORMAL HIGH (ref 70–99)
Glucose, Bld: 94 mg/dL (ref 70–99)
Potassium: 3.8 mmol/L (ref 3.5–5.1)
Potassium: 3.7 mmol/L (ref 3.5–5.1)
Sodium: 137 mmol/L (ref 135–145)
Sodium: 136 mmol/L (ref 135–145)

## 2019-11-28 LAB — GLUCOSE, CAPILLARY
Glucose-Capillary: 133 mg/dL — ABNORMAL HIGH (ref 70–99)
Glucose-Capillary: 67 mg/dL — ABNORMAL LOW (ref 70–99)
Glucose-Capillary: 88 mg/dL (ref 70–99)
Glucose-Capillary: 78 mg/dL (ref 70–99)
Glucose-Capillary: 53 mg/dL — ABNORMAL LOW (ref 70–99)
Glucose-Capillary: 105 mg/dL — ABNORMAL HIGH (ref 70–99)
Glucose-Capillary: 63 mg/dL — ABNORMAL LOW (ref 70–99)
Glucose-Capillary: 53 mg/dL — ABNORMAL LOW (ref 70–99)
Glucose-Capillary: 106 mg/dL — ABNORMAL HIGH (ref 70–99)
Glucose-Capillary: 90 mg/dL (ref 70–99)
Glucose-Capillary: 87 mg/dL (ref 70–99)

## 2019-11-28 LAB — PREPARE RBC (CROSSMATCH)

## 2019-11-28 LAB — MAGNESIUM: Magnesium: 2.1 mg/dL (ref 1.7–2.4)

## 2019-11-28 LAB — LACTATE DEHYDROGENASE: LDH: 283 U/L — ABNORMAL HIGH (ref 98–192)

## 2019-11-28 LAB — TRIGLYCERIDES: Triglycerides: 67 mg/dL (ref <150)

## 2019-11-28 MED ORDER — POTASSIUM CHLORIDE 10 MEQ/50ML IV SOLN
10.0000 meq | INTRAVENOUS | Status: AC
  Administered 2019-11-28 (×4): 10 meq via INTRAVENOUS
  Filled 2019-11-28 (×4): qty 50

## 2019-11-28 MED ORDER — DEXTROSE-NACL 5-0.45 % IV SOLN: INTRAVENOUS | Status: DC

## 2019-11-28 MED ORDER — DEXTROSE 50 % IV SOLN
25.0000 g | Freq: Once | INTRAVENOUS | Status: AC
  Administered 2019-11-28: 25 g via INTRAVENOUS
  Filled 2019-11-28: qty 50

## 2019-11-28 MED ORDER — POTASSIUM CHLORIDE 10 MEQ/50ML IV SOLN: 10.0000 meq | INTRAVENOUS | Status: DC

## 2019-11-28 MED ORDER — POTASSIUM CHLORIDE 10 MEQ/50ML IV SOLN
10.0000 meq | INTRAVENOUS | Status: AC
  Administered 2019-11-28 – 2019-11-29 (×3): 10 meq via INTRAVENOUS
  Filled 2019-11-28 (×3): qty 50

## 2019-11-28 MED ORDER — DEXTROSE 50 % IV SOLN
12.5000 g | INTRAVENOUS | Status: AC
  Administered 2019-11-28: 12.5 g via INTRAVENOUS

## 2019-11-28 MED ORDER — SODIUM CHLORIDE 0.9% IV SOLUTION: Freq: Once | INTRAVENOUS | Status: AC

## 2019-11-28 MED ORDER — LIDOCAINE HCL (PF) 1 % IJ SOLN
INTRAMUSCULAR | Status: AC
  Administered 2019-11-28: 30 mL
  Filled 2019-11-28: qty 5

## 2019-11-28 MED ORDER — CALCIUM CHLORIDE 10 % IV SOLN
INTRAVENOUS | Status: AC
  Filled 2019-11-28: qty 10

## 2019-11-28 MED ORDER — ALBUMIN HUMAN 5 % IV SOLN
INTRAVENOUS | Status: AC
  Administered 2019-11-28: 12.5 g
  Filled 2019-11-28: qty 500

## 2019-11-28 MED ORDER — LIDOCAINE HCL (PF) 1 % IJ SOLN
INTRAMUSCULAR | Status: AC
  Administered 2019-11-28: 5 mL
  Filled 2019-11-28: qty 30

## 2019-11-28 NOTE — Plan of Care (Signed)

## 2019-11-28 NOTE — Progress Notes (Signed)
Pharmacy at bedside concerning bruising while on systemic Heparin. Heparin rate decreased from 1400 units to 1200 units. Awaiting MD bedside rounding for further instructions.

## 2019-11-28 NOTE — Progress Notes (Signed)
Heparin gtt off per MD order prior to chest tube insertion.

## 2019-11-28 NOTE — Progress Notes (Signed)
[place] [facility] at bedside for chest tube insertion. CT tray, sahara, and lidocaine at bedside per MD request. Sedation provided prior to insertion per MD verbal order. Upon insertion, 1831mL immediately into sahara. BP dropped with MAP in 50s, verbal order given to double Levophed dose from 40mcg to 19mcg. O2 sats also noted to drop, 100% O2 breaths given.  224mL Albumin with Calcium and 1 unit of PRBC verbal order from [employer].   Bensimhon gave verbal order to d/c Lasix gtt. Additional unit of PRBC and 3 runs of K+ verbal order given from [place].

## 2019-11-28 NOTE — Progress Notes (Signed)
2 Days Post-Op Procedure(s) (LRB): CORONARY ARTERY BYPASS GRAFTING (CABG) using endoscopic greater saphenous vein harvest: svc to OM; svc to Diag; svc to PD; and LIMA to LAD. (N/A) TRANSESOPHAGEAL ECHOCARDIOGRAM (TEE) (N/A) INDOCYANINE GREEN FLUORESCENCE IMAGING (ICG) (N/A) MAZE using Bilateral Pulmonary Vein isolation. (N/A) Clipping Of Atrial Appendage using AtriCure 40 Clip (N/A) Subjective: Patient with evidence of bleeding overnight requiring 2 units of packed cells and with ecchymoses developing over right flank. Unfortunately heparin continued peripherally at 1400 units/h for Impella 5.5.  Heparin has been stopped.  Because morning chest x-ray showed new right pleural effusion a 28 French tube was placed and 1.8 L of blood was removed.  Chest x-ray is pending.  Plan holding heparin today and follow serial hemoglobin. Hold vent weaning. Objective: Vital signs in last 24 hours: Temp:  [97.9 F (36.6 C)-98.8 F (37.1 C)] 98.2 F (36.8 C) (12/20 1030) Pulse Rate:  [86-101] 89 (12/20 1119) Cardiac Rhythm: Normal sinus rhythm (12/20 0730) Resp:  [11-28] 18 (12/20 1119) BP: (93-117)/(54-82) 93/54 (12/20 1119) SpO2:  [92 %-100 %] 95 % (12/20 1119) Arterial Line BP: (78-133)/(52-82) 106/62 (12/20 1030) FiO2 (%):  [40 %-60 %] 50 % (12/20 1119) Weight:  [97 kg] 97 kg (12/20 0500)  Hemodynamic parameters for last 24 hours: PAP: (32-57)/(10-33) 37/14 CVP:  [6 mmHg-25 mmHg] 7 mmHg CO:  [5.5 L/min-8 L/min] 5.8 L/min CI:  [2.7 L/min/m2-3.9 L/min/m2] 2.9 L/min/m2  Intake/Output from previous day: 12/19 0701 - 12/20 0700 In: 4620.3 [I.V.:3448.6; Blood:714; IV Piggyback:199.9] Out: 6767 [Urine:3000; Emesis/NG output:100; Chest Tube:312] Intake/Output this shift: Total I/O In: 674.8 [I.V.:553.4; Other:21.3; IV Piggyback:100.1] Out: 820 [Urine:640; Chest Tube:180]  Responsive on ventilator while sedated Diminished breath sounds on right Generalized edema Ecchymoses over right flank and  chest wall extending to hip  Lab Results: Recent Labs    11/27/19 1707 11/27/19 1722 11/28/19 0354  WBC 7.9  --  11.7*  HGB 7.4* 9.2* 8.7*  HCT 21.7* 27.0* 26.2*  PLT 85*  --  87*   BMET:  Recent Labs    11/27/19 1707 11/27/19 1722 11/28/19 0354  NA 135 136 137  K 4.3 4.2 3.8  CL 102  --  102  CO2 24  --  27  GLUCOSE 170*  --  132*  BUN 16  --  16  CREATININE 0.97  --  0.87  CALCIUM 7.8*  --  7.7*    PT/INR:  Recent Labs    11/26/19 1455  LABPROT 17.3*  INR 1.4*   ABG    Component Value Date/Time   PHART 7.471 (H) 11/27/2019 1722   HCO3 25.6 11/27/2019 1722   TCO2 27 11/27/2019 1722   ACIDBASEDEF 6.0 (H) 11/24/2019 1718   O2SAT 63.7 11/28/2019 0402   CBG (last 3)  Recent Labs    11/28/19 0856 11/28/19 0934 11/28/19 1055  GLUCAP 88 78 53*    Assessment/Plan: S/P Procedure(s) (LRB): CORONARY ARTERY BYPASS GRAFTING (CABG) using endoscopic greater saphenous vein harvest: svc to OM; svc to Diag; svc to PD; and LIMA to LAD. (N/A) TRANSESOPHAGEAL ECHOCARDIOGRAM (TEE) (N/A) INDOCYANINE GREEN FLUORESCENCE IMAGING (ICG) (N/A) MAZE using Bilateral Pulmonary Vein isolation. (N/A) Clipping Of Atrial Appendage using AtriCure 40 Clip (N/A) Maintaining sinus rhythm Hold peripheral heparin due to clinical bleeding, continue heparin and purge Transfuse for hemoglobin less than 8   LOS: 8 days    Kevin Underwood 11/28/2019

## 2019-11-28 NOTE — Progress Notes (Signed)
Patient ID: Kevin Underwood, male   DOB: 1961/06/18, 58 y.o.   MRN: [id number]     Advanced Heart Failure Rounding Note  PCP-Cardiologist: Rozann Lesches, MD   Subjective:    - Impella 5.5 placed 12/16 due to cardiogenic shock.  - CABG x 4 + Maze 12/18  Remains intubated. Awake on vent. Has expanding ecchymosis down entire R side. Was on heparin at 1400u/hr. Stopped earlier today. Received 2u RBCs overnight.   CXR with new R effusion. Dr. Prescott Gum placed R chest tube this am with ~ 2L blood out.   On NE 22, VP 0.02, epi 5, milrinone 0.25 and dopamine 2.5. On lasix at 5.   He has remained in NSR on amiodarone gtt.    Impella: Flow 4.1 L/min at P8, Wave form. LDH 283  Swan numbers: CVP 8-9 PA 40/15 Thermo 5.8/2.9 Co-ox 56% -> 64%    Objective:   Weight Range: 97 kg Body mass index is 30.68 kg/m.   Vital Signs:   Temp:  [97.9 F (36.6 C)-98.8 F (37.1 C)] 98.2 F (36.8 C) (12/20 1030) Pulse Rate:  [86-101] 89 (12/20 1119) Resp:  [11-28] 18 (12/20 1119) BP: (93-117)/(54-82) 93/54 (12/20 1119) SpO2:  [92 %-100 %] 95 % (12/20 1119) Arterial Line BP: (78-133)/(52-82) 106/62 (12/20 1030) FiO2 (%):  [40 %-60 %] 50 % (12/20 1119) Weight:  [97 kg] 97 kg (12/20 0500) Last BM Date: 11/23/19  Weight change: Filed Weights   11/26/19 0549 11/27/19 0447 11/28/19 0500  Weight: 86 kg 97 kg 97 kg    Intake/Output:   Intake/Output Summary (Last 24 hours) at 11/28/2019 1204 Last data filed at 11/28/2019 1100 Gross per 24 hour  Intake 4446.54 ml  Output 3610 ml  Net 836.54 ml      Physical Exam    General:  Awake on vent HEENT: normal +ETT Neck: supple. RIJ swan. Carotids 2+ bilat; no bruits. No lymphadenopathy or thryomegaly appreciated. Cor: Impella site with small hematoma RRR  Lungs: decreased on right CT in place Abdomen: soft, nontender, nondistended. No hepatosplenomegaly. No bruits or masses. Hypoactive bowel sounds. Extremities: no cyanosis, clubbing,  rash, 1+ edema Neuro: awake on vent following commands Diffuse ecchymosis down entire R side into thigh  Telemetry   NSR 80-90s (personally reviewed)  Labs    CBC Recent Labs    11/27/19 1707 11/27/19 1722 11/28/19 0354  WBC 7.9  --  11.7*  HGB 7.4* 9.2* 8.7*  HCT 21.7* 27.0* 26.2*  MCV 83.5  --  84.2  PLT 85*  --  87*   Basic Metabolic Panel Recent Labs    11/27/19 1707 11/27/19 1722 11/28/19 0354  NA 135 136 137  K 4.3 4.2 3.8  CL 102  --  102  CO2 24  --  27  GLUCOSE 170*  --  132*  BUN 16  --  16  CREATININE 0.97  --  0.87  CALCIUM 7.8*  --  7.7*  MG 2.2  --  2.1   Liver Function Tests Recent Labs    11/26/19 2120  AST 50*  ALT 31  ALKPHOS 42  BILITOT 1.3*  PROT 4.4*  ALBUMIN 2.8*   No results for input(s): LIPASE, AMYLASE in the last 72 hours. Cardiac Enzymes No results for input(s): CKTOTAL, CKMB, CKMBINDEX, TROPONINI in the last 72 hours.  BNP: BNP (last 3 results) Recent Labs    11/22/19 0414 11/23/19 0453 11/25/19 0500  BNP 578.0* 1,054.0* 596.1*    ProBNP (  last 3 results) No results for input(s): PROBNP in the last 8760 hours.   D-Dimer No results for input(s): DDIMER in the last 72 hours. Hemoglobin A1C No results for input(s): HGBA1C in the last 72 hours. Fasting Lipid Panel Recent Labs    11/28/19 0354  TRIG 67   Thyroid Function Tests No results for input(s): TSH, T4TOTAL, T3FREE, THYROIDAB in the last 72 hours.  Invalid input(s): FREET3  Other results:   Imaging    DG Chest Port 1 View  Result Date: 11/28/2019 CLINICAL DATA:  Left ventricular assist device EXAM: PORTABLE CHEST 1 VIEW COMPARISON:  Yesterday FINDINGS: Endotracheal tube tip is at the clavicular heads. Swan-Ganz catheter from the right with tip at the right main pulmonary artery. Left ventricular assist device in stable position. Chest tubes are present. Right PICC in unremarkable position. Enteric tube which at least reaches the stomach. Hazy  density at the lung bases compatible with pleural fluid with fissural component on the right. Diffuse interstitial coarsening. No visible pneumothorax. Stable cardiopericardial enlargement IMPRESSION: 1. Pleural fluid and pulmonary edema with progressive density since yesterday. 2. Stable hardware positioning Electronically Signed   By: Monte Fantasia M.D.   On: 11/28/2019 09:02     Medications:     Scheduled Medications: . acetaminophen (TYLENOL) oral liquid 160 mg/5 mL  650 mg Per Tube Q6H  . aspirin  324 mg Per Tube Daily  . bisacodyl  10 mg Oral Daily   Or  . bisacodyl  10 mg Rectal Daily  . calcium chloride      . chlorhexidine gluconate (MEDLINE KIT)  15 mL Mouth Rinse BID  . Chlorhexidine Gluconate Cloth  6 each Topical Daily  . insulin aspart  0-20 Units Subcutaneous Q4H  . lidocaine (PF)      . lidocaine (PF)      . mouth rinse  15 mL Mouth Rinse 10 times per day  . pantoprazole (PROTONIX) IV  40 mg Intravenous QHS  . rosuvastatin  40 mg Per Tube q1800  . sodium chloride flush  3 mL Intravenous Q12H    Infusions: . sodium chloride Stopped (11/26/19 1843)  . sodium chloride    . sodium chloride 20 mL/hr at 11/26/19 1445  . albumin human    . amiodarone 30 mg/hr (11/28/19 1124)  . dexmedetomidine (PRECEDEX) IV infusion Stopped (11/26/19 1830)  . DOPamine 2.5 mcg/kg/min (11/28/19 1100)  . epinephrine 5 mcg/min (11/28/19 1100)  . fentaNYL infusion INTRAVENOUS 200 mcg/hr (11/28/19 1100)  . furosemide (LASIX) infusion 5 mg/hr (11/28/19 1100)  . impella catheter heparin 50 unit/mL in dextrose 5%    . heparin Stopped (11/28/19 1000)  . lactated ringers    . lactated ringers 20 mL/hr at 11/28/19 1100  . milrinone 0.25 mcg/kg/min (11/28/19 1100)  . norepinephrine (LEVOPHED) Adult infusion 22 mcg/min (11/28/19 1100)  . phenylephrine (NEO-SYNEPHRINE) Adult infusion    . propofol (DIPRIVAN) infusion 25 mcg/kg/min (11/28/19 1100)  . vasopressin (PITRESSIN) infusion - *FOR  SHOCK* 0.02 Units/min (11/28/19 1100)    PRN Medications: sodium chloride, fentaNYL, metoprolol tartrate, midazolam, morphine injection, ondansetron (ZOFRAN) IV, oxyCODONE, sodium chloride flush, traMADol    Assessment/Plan   1. Acute systolic CHF => cardiogenic shock:  Echo showed EF < 20% with mildly decreased RV systolic function. He was admitted with afib/RVR and CHF.  Cath with elevated filing pressures, low CO (CI 1.4 by RHC), and severe 3 vessel coronary disease.  Ischemic cardiomyopathy, possibly contribution from tachycardia with previous atrial fibrillation. Impella  5.5 placed due to cardiogenic shock on 12/16.  CABG on 12/18 with Maze.   - Remains on Impella P8 with good flow.   - He is on dopamine 2.5, epinephrine 5, milrinone 0.25, norepinephrine 22, vasopressin 0.02.  - Swan number and co-ox improved - Now with evidence of ongoing bleeding into R chest.  - Hold lasix gtt for now - Give 2 more units RBCs - Hold heparin. Serial hgbs. Keep hgb > 7.5. If continues to bleed will need chest/impella site explored. D/w Dr. Ron Agee. 2. Atrial fibrillation: With RVR at admission, back in NSR now.  Maze with surgery.  - Remains in NSR. Off heparin with bleed as above - Continue amiodarone gtt.  3. CAD: Severe 3VD in diabetic smoker.  He has exertional chest pain at baseline.  Was not admitted with ACS. Now s/p CABG.  - No s/s ischemia - Continue ASA 81 daily.  - rosuvastatin 40 daily.    4. ABLA from impella site - management as above 5. 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  Suspect group 2 + group 3 (COPD).  PA pressures have come down with diuresis.  6. Diabetes: Poor control.  Per IM, would eventually benefit from SGLT2 inhibitor.  7. OSA: Strongly suspected.  8. COPD: Suspected, former smoker.  Emphysema seen on CT chest.  9. ID: Afebrile, WBCs not elevated.  10. Acute hypoxemic respiratory failure: Intubated post-CABG.   - unable to wean today  with bleeding  11. Thrombocytopenia: plts 85 -> 81 today, suspect post-op.  12. PVCs/NSVT - continue amio - Keep mg > 2.0 K > 4.0 - watch for suction events 13. Hypokalemia - supp   CRITICAL CARE Performed by: Glori Bickers  Total critical care time: 45 minutes  Critical care time was exclusive of separately billable procedures and treating other patients.  Critical care was necessary to treat or prevent imminent or life-threatening deterioration.  Critical care was time spent personally by me on the following activities: development of treatment plan with patient and/or surrogate as well as nursing, discussions with consultants, evaluation of patient's response to treatment, examination of patient, obtaining history from patient or surrogate, ordering and performing treatments and interventions, ordering and review of laboratory studies, ordering and review of radiographic studies, pulse oximetry and re-evaluation of patient's condition.   Length of Stay: [place], MD  11/28/2019, 12:04 PM  Advanced Heart Failure Team Pager [phone] (M-F; 7a - 4p)  Please contact [facility] for night-coverage after hours (4p -7a ) and weekends on [email or web address]

## 2019-11-28 NOTE — Progress Notes (Signed)
PULMONARY / CRITICAL CARE MEDICINE   NAME:  Kevin Underwood, MRN:  [id number], DOB:  [birth year], LOS: 8 ADMISSION DATE:  11/20/2019, CONSULTATION DATE:  11/28/2019  REFERRING MD:  Shirlee Latch, MD, CHIEF COMPLAINT: Respiratory distress requiring BiPAP  BRIEF HISTORY:    58 year old smoker, diabetic, hypertensive admitted 12/12 for atrial fibrillation and congestive heart failure, found to have severely reduced EF less than 20% with global hypokinesis.  Cardiac-cath showed severe three-vessel disease.  He developed cardiogenic shock and respiratory distress requiring BiPAP 12/16 emergent placement of Impella LVAD    SIGNIFICANT PAST MEDICAL HISTORY    has a past medical history of Arthritis, COPD (chronic obstructive pulmonary disease) (HCC), Essential hypertension, and Type 2 diabetes mellitus (HCC).   has a past surgical history that includes Back surgery; Foot surgery; Hand surgery; RIGHT/LEFT HEART CATH AND CORONARY ANGIOGRAPHY (N/A, 11/24/2019); Placement of impella left ventricular assist device (N/A, 11/24/2019); and TEE without cardioversion (N/A, 11/24/2019).    SIGNIFICANT EVENTS:  12/16 R/LHC 12/16 to OR for impella  STUDIES:   12/12 CT angiogram negative for PE 12/13 echo EF less than 20%, global hypokinesis, RVSP 50 12/16 R/LHC >> severe three-vessel disease with proximal LAD stenosis Impella 5.5 placed 12/16 due to cardiogenic shock.    12/17 - Critically ill, intubated On epinephrine, Levophed, milrinone and heparin drips Propofol turned off this morning  12/18 - CABG x 4 + Maze 12/18  12/19 - 50% fio2 on vent. Sedated with diprivan gtt, fent gtt and paralyzed with nimbex. On epig gtt dopamine gtt, levophed gtt, milrinon gtt, lasix gtt, heparin gtt, On Impella  CULTURES:    ANTIBIOTICS:    LINES/TUBES:  12/16 RUE PICC >> 12/16 RIJ swan >>  CONSULTANTS:  TCTS CHF SUBJECTIVE:   11/28/2019 - remains on impella, dopamine, epi gtt, milrinone gtt, levophed gtt,  vasopressoin gtt, Per CHF team - > swan and coox improved. Concerned for bleeding into R Chest (worseing cxr and blood in chest tube). Hgb  <8 and 2  PRBC ordreed. Plan is for chest exploratiion if continues to bleed. Heparin has been stopped for > 2h  In NSR - with amio gtt being continued.   Plat dropped to high 70s - suspect post op  On diprivan gtt and fent gtt . Off nimbex  CONSTITUTIONAL: BP 138/84   Pulse 88   Temp 98.1 F (36.7 C)   Resp 18   Ht 5\' 10"  (1.778 m)   Wt 97 kg   SpO2 100%   BMI 30.68 kg/m   I/O last 3 completed shifts: In: 7583.3 [I.V.:5219.7; Blood:714; Other:379.6; IV Piggyback:1270] Out: 4422 [Urine:3750; Emesis/NG output:100; Chest Tube:572]  PAP: (32-57)/(10-33) 33/14 CVP:  [6 mmHg-25 mmHg] 10 mmHg CO:  [5.5 L/min-8 L/min] 5.8 L/min CI:  [2.7 L/min/m2-3.9 L/min/m2] 2.9 L/min/m2  Vent Mode: PRVC FiO2 (%):  [40 %-60 %] 50 % Set Rate:  [18 bmp] 18 bmp Vt Set:  [580 mL] 580 mL PEEP:  [7 cmH20] 7 cmH20 Plateau Pressure:  [14 cmH20-25 cmH20] 23 cmH20    General Appearance:  Looks criticall ill Head:  Normocephalic, without obvious abnormality, atraumatic Eyes:  PERRL - yes, conjunctiva/corneas - muddy     Ears:  Normal external ear canals, both ears Nose:  G tube - no Throat:  ETT TUBE - yes , OG tube - yes Neck:  Supple,  No enlargement/tenderness/nodules Lungs: Clear to auscultation bilaterally, Ventilator   Synchrony - yes. CHEST TUBE + Heart:  S1 and S2 normal, no murmur, CVP -  x.  Pressors - multiple + Abdomen:  Soft, no masses, no organomegaly Genitalia / Rectal:  Not done Extremities:  Extremities- intact Skin:  ntact in exposed areas . Sacral area - not examined Neurologic:  Sedation - fent gtt, diprivan gtt -> RASS - -2 . Moves all 4s - yes. CAM-ICU - x . Orientation - followed some commands. Wife at bedside      RESOLVED PROBLEM LIST   ASSESSMENT AND PLAN    Acute resp hypoxemic failure due to cardiogenic shock - s/P CABG with  maze 11/26/2019  11/28/2019 - > does not meet criteria for SBT/Extubation in setting of Acute Respiratory Failure due to circulatory failure  Plan  - Full vent support  - continue sedation with diprivan, fent gtt - shock mgmt per cvts and cards   Anemia critical illness   12/20 - hgb 7.4gm% with hemorrhaginc component from chest . CVTS maanaging  Plan   - goal hgb  >8  - CVTS managing   Acute circulatory failure - shock due to cardiogenic and 11/28/2019 also hemorrhagic   11/28/2019 - multiple pressors and blood  Plan  - perCVTS  Thrombocytopenia - post CABG  11/28/2019 - plat down to high 70s  Plan  - monitor  Hyperglycemia  Plan  - ssi  SUMMARY OF TODAY'S PLAN:  To OR emergently  Best Practice / Goals of Care / Disposition.   DVT PROPHYLAXIS: IV heparin - on hold SUP: Protonix NUTRITION: N.p.o. MOBILITY: Bedrest GOALS OF CARE: Aggressive care for now FAMILY DISCUSSIONS: per CVTS DISPOSITION ICU   ATTESTATION & SIGNATURE   The patient Kevin Underwood is critically ill with multiple organ systems failure and requires high complexity decision making for assessment and support, frequent evaluation and titration of therapies, application of advanced monitoring technologies and extensive interpretation of multiple databases.   Critical Care Time devoted to patient care services described in this note is  30  Minutes. This time reflects time of care of this signee Dr Brand Males. This critical care time does not reflect procedure time, or teaching time or supervisory time of PA/NP/Med student/Med Resident etc but could involve care discussion time     Dr. Brand Males, M.D., [facility]C.P Pulmonary and Critical Care Medicine Staff Physician [facility] Pager: [phone], If no answer or between  15:00h - 7:00h: call [phone]  11/28/2019 1:43 PM    LABS    PULMONARY Recent Labs  Lab  11/26/19 1327 11/26/19 1340 11/26/19 1500 11/26/19 1922 11/26/19 2102 11/27/19 0327 11/27/19 1058 11/27/19 1707 11/27/19 1722 11/28/19 0402  PHART 7.329*  --  7.440 7.366 7.462*  --   --   --  7.471*  --   PCO2ART 50.1*  --  41.9 46.4 36.9  --   --   --  35.2  --   PO2ART 168.0*  --  190.0* 104.0 71.0*  --   --   --  67.0*  --   HCO3 26.3  --  28.9* 26.8 26.7  --   --   --  25.6  --   TCO2 28 26 30 28 28   --   --   --  27  --   O2SAT 99.0  --  100.0 98.0 96.0 55.9 69.4 73.6 94.0 63.7    CBC Recent Labs  Lab 11/27/19 1707 11/27/19 1722 11/28/19 0354 11/28/19 1142  HGB 7.4* 9.2* 8.7* 7.4*  HCT 21.7* 27.0*  26.2* 21.7*  WBC 7.9  --  11.7* 10.9*  PLT 85*  --  87* 81*    COAGULATION Recent Labs  Lab 11/26/19 1455  INR 1.4*    CARDIAC  No results for input(s): TROPONINI in the last 168 hours. No results for input(s): PROBNP in the last 168 hours.   CHEMISTRY Recent Labs  Lab 11/26/19 0100 11/26/19 0513 11/26/19 1340 11/26/19 2120 11/27/19 0322 11/27/19 1707 11/27/19 1722 11/28/19 0354  NA 133* 135 137 138 137 135 136 137  K 3.7 3.4* 3.9 4.4 4.1 4.3 4.2 3.8  CL 99 98 100 105 104 102  --  102  CO2 26 29  --  25 26 24   --  27  GLUCOSE 157* 120* 142* 136* 121* 170*  --  132*  BUN 22* 18 18 16 15 16   --  16  CREATININE 0.97 0.95 0.70 0.86 0.82 0.97  --  0.87  CALCIUM 7.7* 7.8*  --  8.1* 7.9* 7.8*  --  7.7*  MG 2.0  --   --  2.5* 2.2 2.2  --  2.1   Estimated Creatinine Clearance: 108.1 mL/min (by C-G formula based on SCr of 0.87 mg/dL).   LIVER Recent Labs  Lab 11/24/19 2114 11/26/19 1455 11/26/19 2120  AST 76*  --  50*  ALT 111*  --  31  ALKPHOS 92  --  42  BILITOT 0.5  --  1.3*  PROT 4.6*  --  4.4*  ALBUMIN 2.6*  --  2.8*  INR  --  1.4*  --      INFECTIOUS Recent Labs  Lab 11/24/19 2114 11/25/19 0741  LATICACIDVEN 2.0* 1.9     ENDOCRINE CBG (last 3)  Recent Labs    11/28/19 1055 11/28/19 1130 11/28/19 1318  GLUCAP 53* 105* 63*          IMAGING x48h  - image(s) personally visualized  -   highlighted in bold DG Chest Port 1 View  Result Date: 11/28/2019 CLINICAL DATA:  Evaluation of chest tube placement. EXAM: PORTABLE CHEST 1 VIEW COMPARISON:  November 28, 2019 FINDINGS: The PA catheter terminates in the right pulmonary outflow tract, stable. An NG tube terminates below today's film. An Impella device projects over the left ventricle. A right PICC line terminates near the caval atrial junction. The left-sided chest tube is stable. The chest tube on the right seen previously remains in stable position. There is increasing density around the tip of the chest tube on the right which has slowly increased since November 26, 2019 and may be in the pleural space. A new right-sided chest tube is been placed, in good position. No pneumothorax. Left-sided pleural effusion with underlying opacity, stable. No other interval changes. IMPRESSION: 1. There is increasing rounded density around the distal right chest tube which has progressed over the last several studies. This increasing density is favored to be loculated fluid in the pleural space. Recommend attention on follow-up. 2. The new right chest tube is in good position. 3. Other support apparatus as above. 4. Left-sided pleural effusion with underlying opacity, stable. Electronically Signed   By: Gerome Sam III M.D   On: 11/28/2019 13:01   DG Chest Port 1 View  Result Date: 11/28/2019 CLINICAL DATA:  Left ventricular assist device EXAM: PORTABLE CHEST 1 VIEW COMPARISON:  Yesterday FINDINGS: Endotracheal tube tip is at the clavicular heads. Swan-Ganz catheter from the right with tip at the right main pulmonary artery. Left ventricular assist device  in stable position. Chest tubes are present. Right PICC in unremarkable position. Enteric tube which at least reaches the stomach. Hazy density at the lung bases compatible with pleural fluid with fissural component on the right.  Diffuse interstitial coarsening. No visible pneumothorax. Stable cardiopericardial enlargement IMPRESSION: 1. Pleural fluid and pulmonary edema with progressive density since yesterday. 2. Stable hardware positioning Electronically Signed   By: Marnee SpringJonathon  Watts M.D.   On: 11/28/2019 09:02   DG Chest Port 1 View  Result Date: 11/27/2019 CLINICAL DATA:  Status post CABG. EXAM: PORTABLE CHEST 1 VIEW COMPARISON:  Chest x-ray from yesterday. FINDINGS: Unchanged endotracheal and enteric tubes. Unchanged Swan-Ganz catheter and right upper extremity PICC line. Unchanged Impella device. Unchanged bilateral chest tubes and mediastinal drains. Stable cardiomediastinal silhouette status post CABG and left atrial appendage clipping. Unchanged pulmonary vascular congestion, small bilateral pleural effusions, and bibasilar atelectasis, greater on the left. No pneumothorax. No acute osseous abnormality. IMPRESSION: 1. Stable support lines and tubes status post CABG. No pneumothorax. 2. Unchanged pulmonary vascular congestion, small bilateral pleural effusions, and bibasilar atelectasis. Electronically Signed   By: Obie DredgeWilliam T Derry M.D.   On: 11/27/2019 08:32   DG Chest Port 1 View  Result Date: 11/26/2019 CLINICAL DATA:  CABG. EXAM: PORTABLE CHEST 1 VIEW COMPARISON:  Chest x-ray 11/26/2019. FINDINGS: Endotracheal tube tip noted 4 cm above the carina. NG tube tip noted below left hemidiaphragm. Swan-Ganz catheter noted with tip over pulmonary artery in stable position. Impella device noted in stable position. Right chest tube and mediastinal drainage catheters in good anatomic position. Interim CABG. Left atrial appendage clip noted. Cardiomegaly with mild interstitial prominence. No focal alveolar infiltrate. No pleural effusion. No pneumothorax. IMPRESSION: 1. Patient status post CABG. Cardiomegaly noted with mild bilateral interstitial prominence. Mild interstitial edema cannot be excluded. 2. Lines and tubes including  right chest tube in good anatomic position. Impella device in stable position. Electronically Signed   By: Maisie Fushomas  Register   On: 11/26/2019 16:06

## 2019-11-28 NOTE — Progress Notes (Signed)
ANTICOAGULATION CONSULT NOTE -  Consult  Pharmacy Consult for Heparin Indication: Impella  No Known Allergies  Patient Measurements: Height: 5\' 10"  (177.8 cm) Weight: 213 lb 13.5 oz (97 kg) IBW/kg (Calculated) : 73   Vital Signs: Temp: 98.2 F (36.8 C) (12/20 1500) Temp Source: Core (12/20 1400) BP: 138/84 (12/20 1200) Pulse Rate: 89 (12/20 1500)  Labs: Recent Labs    11/25/19 1735 11/26/19 0020 11/26/19 0300 11/26/19 0513 11/26/19 0854 11/26/19 1455 11/26/19 1500 11/27/19 0322 11/27/19 0907 11/27/19 1707 11/27/19 1722 11/28/19 0354 11/28/19 1142  HGB  --   --   --  12.6*  --  8.2*  --  8.5*  --  7.4* 9.2* 8.7* 7.4*  HCT  --   --   --  39.1  --  24.9*  --  25.6*  --  21.7* 27.0* 26.2* 21.7*  PLT  --   --   --  142*   < > 99*   < > 85*  --  85*  --  87* 81*  APTT >200*  --   --  143*  --  40*  --   --   --   --   --   --   --   LABPROT  --   --   --   --   --  17.3*  --   --   --   --   --   --   --   INR  --   --   --   --   --  1.4*  --   --   --   --   --   --   --   HEPARINUNFRC 1.18*  --  1.00*  --   --   --   --   --  <0.10*  --   --  0.68  --   CREATININE  --    < >  --  0.95  --   --    < > 0.82  --  0.97  --  0.87  --    < > = values in this interval not displayed.    Estimated Creatinine Clearance: 108.1 mL/min (by C-G formula based on SCr of 0.87 mg/dL).   Medical History: Past Medical History:  Diagnosis Date  . Arthritis   . COPD (chronic obstructive pulmonary disease) ([place])   . Essential hypertension   . Type 2 diabetes mellitus ([place])       Assessment:58yom admitted with CAD, EF 20% impella 5.5 placed and CABG.  Heparin in purge solution to prevent pump clots.  Systemic heparin started 12/19 but drop in h/h and replaced with PRBC  - stop systemic heparin 12/20   Goal of Therapy:   Monitor platelets by anticoagulation protocol: Yes   Plan:  Continue heparin in purge solution Monitor s/s bleeding F/u plans to restart systemic  heparin  Bonnita Nasuti Pharm.D. CPP, BCPS Clinical Pharmacist [phone] 11/28/2019 3:13 PM

## 2019-11-28 NOTE — Progress Notes (Signed)
Atkins at bedside. Verbal order to wean nitric over night. Respiratory notified.

## 2019-11-29 ENCOUNTER — Inpatient Hospital Stay (HOSPITAL_COMMUNITY): Payer: 59

## 2019-11-29 ENCOUNTER — Encounter: Payer: Self-pay | Admitting: *Deleted

## 2019-11-29 DIAGNOSIS — R0603 Acute respiratory distress: Secondary | ICD-10-CM

## 2019-11-29 DIAGNOSIS — Z95811 Presence of heart assist device: Secondary | ICD-10-CM

## 2019-11-29 DIAGNOSIS — Z9689 Presence of other specified functional implants: Secondary | ICD-10-CM

## 2019-11-29 LAB — POCT I-STAT 7, (LYTES, BLD GAS, ICA,H+H)
Acid-Base Excess: 2 mmol/L (ref 0.0–2.0)
Acid-Base Excess: 3 mmol/L — ABNORMAL HIGH (ref 0.0–2.0)
Bicarbonate: 26.3 mmol/L (ref 20.0–28.0)
Bicarbonate: 27.9 mmol/L (ref 20.0–28.0)
Calcium, Ion: 1.15 mmol/L (ref 1.15–1.40)
Calcium, Ion: 1.16 mmol/L (ref 1.15–1.40)
HCT: 21 % — ABNORMAL LOW (ref 39.0–52.0)
HCT: 41 % (ref 39.0–52.0)
Hemoglobin: 7.1 g/dL — ABNORMAL LOW (ref 13.0–17.0)
Hemoglobin: 13.9 g/dL (ref 13.0–17.0)
O2 Saturation: 99 %
O2 Saturation: 95 %
Patient temperature: 36.8
Patient temperature: 35.9
Potassium: 3.6 mmol/L (ref 3.5–5.1)
Potassium: 3.4 mmol/L — ABNORMAL LOW (ref 3.5–5.1)
Sodium: 135 mmol/L (ref 135–145)
Sodium: 137 mmol/L (ref 135–145)
TCO2: 27 mmol/L (ref 22–32)
TCO2: 29 mmol/L (ref 22–32)
pCO2 arterial: 36.3 mmHg (ref 32.0–48.0)
pCO2 arterial: 39.3 mmHg (ref 32.0–48.0)
pH, Arterial: 7.468 — ABNORMAL HIGH (ref 7.350–7.450)
pH, Arterial: 7.455 — ABNORMAL HIGH (ref 7.350–7.450)
pO2, Arterial: 116 mmHg — ABNORMAL HIGH (ref 83.0–108.0)
pO2, Arterial: 69 mmHg — ABNORMAL LOW (ref 83.0–108.0)

## 2019-11-29 LAB — BASIC METABOLIC PANEL
Anion gap: 7 (ref 5–15)
Anion gap: 9 (ref 5–15)
BUN: 13 mg/dL (ref 6–20)
BUN: 11 mg/dL (ref 6–20)
CO2: 25 mmol/L (ref 22–32)
CO2: 26 mmol/L (ref 22–32)
Calcium: 7.5 mg/dL — ABNORMAL LOW (ref 8.9–10.3)
Calcium: 7.7 mg/dL — ABNORMAL LOW (ref 8.9–10.3)
Chloride: 103 mmol/L (ref 98–111)
Chloride: 101 mmol/L (ref 98–111)
Creatinine, Ser: 0.65 mg/dL (ref 0.61–1.24)
Creatinine, Ser: 0.98 mg/dL (ref 0.61–1.24)
GFR calc Af Amer: >60 mL/min (ref >60)
GFR calc Af Amer: >60 mL/min (ref >60)
GFR calc non Af Amer: >60 mL/min (ref >60)
GFR calc non Af Amer: >60 mL/min (ref >60)
Glucose, Bld: 129 mg/dL — ABNORMAL HIGH (ref 70–99)
Glucose, Bld: 151 mg/dL — ABNORMAL HIGH (ref 70–99)
Potassium: 3.7 mmol/L (ref 3.5–5.1)
Potassium: 3.6 mmol/L (ref 3.5–5.1)
Sodium: 135 mmol/L (ref 135–145)
Sodium: 136 mmol/L (ref 135–145)

## 2019-11-29 LAB — CBC
HCT: 23.6 % — ABNORMAL LOW (ref 39.0–52.0)
HCT: 28 % — ABNORMAL LOW (ref 39.0–52.0)
Hemoglobin: 8 g/dL — ABNORMAL LOW (ref 13.0–17.0)
Hemoglobin: 9.5 g/dL — ABNORMAL LOW (ref 13.0–17.0)
MCH: 28.6 pg (ref 26.0–34.0)
MCH: 29.1 pg (ref 26.0–34.0)
MCHC: 33.9 g/dL (ref 30.0–36.0)
MCHC: 33.9 g/dL (ref 30.0–36.0)
MCV: 84.3 fL (ref 80.0–100.0)
MCV: 85.9 fL (ref 80.0–100.0)
Platelets: 72 10*3/uL — ABNORMAL LOW (ref 150–400)
Platelets: 73 10*3/uL — ABNORMAL LOW (ref 150–400)
RBC: 2.8 MIL/uL — ABNORMAL LOW (ref 4.22–5.81)
RBC: 3.26 MIL/uL — ABNORMAL LOW (ref 4.22–5.81)
RDW: 15.9 % — ABNORMAL HIGH (ref 11.5–15.5)
RDW: 15.5 % (ref 11.5–15.5)
WBC: 9 10*3/uL (ref 4.0–10.5)
WBC: 7.9 10*3/uL (ref 4.0–10.5)
nRBC: 0 % (ref 0.0–0.2)
nRBC: 0 % (ref 0.0–0.2)

## 2019-11-29 LAB — COOXEMETRY PANEL
Carboxyhemoglobin: 1.7 % — ABNORMAL HIGH (ref 0.5–1.5)
Carboxyhemoglobin: 2 % — ABNORMAL HIGH (ref 0.5–1.5)
Carboxyhemoglobin: 1.6 % — ABNORMAL HIGH (ref 0.5–1.5)
Methemoglobin: 1.3 % (ref 0.0–1.5)
Methemoglobin: 1.1 % (ref 0.0–1.5)
Methemoglobin: 0.8 % (ref 0.0–1.5)
O2 Saturation: 66.6 %
O2 Saturation: 96.7 %
O2 Saturation: 79.8 %
Total hemoglobin: 8.4 g/dL — ABNORMAL LOW (ref 12.0–16.0)
Total hemoglobin: 10 g/dL — ABNORMAL LOW (ref 12.0–16.0)
Total hemoglobin: 9.6 g/dL — ABNORMAL LOW (ref 12.0–16.0)

## 2019-11-29 LAB — MAGNESIUM: Magnesium: 1.9 mg/dL (ref 1.7–2.4)

## 2019-11-29 LAB — GLUCOSE, CAPILLARY
Glucose-Capillary: 100 mg/dL — ABNORMAL HIGH (ref 70–99)
Glucose-Capillary: 114 mg/dL — ABNORMAL HIGH (ref 70–99)
Glucose-Capillary: 116 mg/dL — ABNORMAL HIGH (ref 70–99)
Glucose-Capillary: 121 mg/dL — ABNORMAL HIGH (ref 70–99)
Glucose-Capillary: 124 mg/dL — ABNORMAL HIGH (ref 70–99)
Glucose-Capillary: 144 mg/dL — ABNORMAL HIGH (ref 70–99)
Glucose-Capillary: 154 mg/dL — ABNORMAL HIGH (ref 70–99)
Glucose-Capillary: 162 mg/dL — ABNORMAL HIGH (ref 70–99)

## 2019-11-29 LAB — LACTATE DEHYDROGENASE: LDH: 234 U/L — ABNORMAL HIGH (ref 98–192)

## 2019-11-29 LAB — TRIGLYCERIDES: Triglycerides: 90 mg/dL (ref <150)

## 2019-11-29 LAB — HEPARIN LEVEL (UNFRACTIONATED): Heparin Unfractionated: <0.1 IU/mL — ABNORMAL LOW (ref 0.30–0.70)

## 2019-11-29 LAB — PREPARE RBC (CROSSMATCH)

## 2019-11-29 LAB — POCT ACTIVATED CLOTTING TIME: Activated Clotting Time: 131 seconds

## 2019-11-29 MED ORDER — ARFORMOTEROL TARTRATE 15 MCG/2ML IN NEBU
15.0000 ug | INHALATION_SOLUTION | Freq: Two times a day (BID) | RESPIRATORY_TRACT | Status: DC
  Administered 2019-11-29 – 2019-12-08 (×19): 15 ug via RESPIRATORY_TRACT
  Filled 2019-11-29 (×19): qty 2

## 2019-11-29 MED ORDER — SODIUM CHLORIDE 0.9% IV SOLUTION: Freq: Once | INTRAVENOUS | Status: DC

## 2019-11-29 MED ORDER — POTASSIUM CHLORIDE 10 MEQ/50ML IV SOLN
10.0000 meq | INTRAVENOUS | Status: AC
  Administered 2019-11-29 (×3): 10 meq via INTRAVENOUS
  Filled 2019-11-29 (×3): qty 50

## 2019-11-29 MED ORDER — SENNOSIDES-DOCUSATE SODIUM 8.6-50 MG PO TABS
1.0000 | ORAL_TABLET | Freq: Every day | ORAL | Status: DC
  Administered 2019-11-29: 1
  Filled 2019-11-29 (×2): qty 1

## 2019-11-29 MED ORDER — ORAL CARE MOUTH RINSE
15.0000 mL | Freq: Two times a day (BID) | OROMUCOSAL | Status: DC
  Administered 2019-11-29 – 2019-12-08 (×13): 15 mL via OROMUCOSAL

## 2019-11-29 MED ORDER — ACETAMINOPHEN 325 MG PO TABS
650.0000 mg | ORAL_TABLET | Freq: Four times a day (QID) | ORAL | Status: AC
  Administered 2019-11-29 – 2019-12-01 (×5): 650 mg via ORAL
  Filled 2019-11-29 (×5): qty 2

## 2019-11-29 MED ORDER — FUROSEMIDE 10 MG/ML IJ SOLN
8.0000 mg/h | INTRAVENOUS | Status: DC
  Administered 2019-11-29 – 2019-11-30 (×2): 8 mg/h via INTRAVENOUS
  Administered 2019-12-01 – 2019-12-02 (×2): 4 mg/h via INTRAVENOUS
  Administered 2019-12-03: 8 mg/h via INTRAVENOUS
  Filled 2019-11-29 (×5): qty 25

## 2019-11-29 MED ORDER — ACETAMINOPHEN 325 MG PO TABS: 650.0000 mg | ORAL_TABLET | Freq: Four times a day (QID) | ORAL | Status: DC

## 2019-11-29 MED ORDER — LEVALBUTEROL HCL 0.63 MG/3ML IN NEBU: 0.6300 mg | INHALATION_SOLUTION | RESPIRATORY_TRACT | Status: DC | PRN

## 2019-11-29 MED ORDER — BUDESONIDE 0.5 MG/2ML IN SUSP
0.5000 mg | Freq: Two times a day (BID) | RESPIRATORY_TRACT | Status: DC
  Administered 2019-11-29 – 2019-12-08 (×19): 0.5 mg via RESPIRATORY_TRACT
  Filled 2019-11-29 (×19): qty 2

## 2019-11-29 MED ORDER — IPRATROPIUM BROMIDE 0.02 % IN SOLN
0.5000 mg | Freq: Four times a day (QID) | RESPIRATORY_TRACT | Status: DC
  Administered 2019-11-29 – 2019-12-01 (×9): 0.5 mg via RESPIRATORY_TRACT
  Filled 2019-11-29 (×11): qty 2.5

## 2019-11-29 MED ORDER — SODIUM CHLORIDE 0.9% IV SOLUTION: Freq: Once | INTRAVENOUS | Status: AC

## 2019-11-29 MED ORDER — DOPAMINE-DEXTROSE 3.2-5 MG/ML-% IV SOLN
1.0000 ug/kg/min | INTRAVENOUS | Status: DC
  Administered 2019-12-01: 1 ug/kg/min via INTRAVENOUS
  Filled 2019-11-29: qty 250

## 2019-11-29 NOTE — Progress Notes (Signed)
PULMONARY / CRITICAL CARE MEDICINE   NAME:  Kevin Underwood, MRN:  [id number], DOB:  1961-02-17, LOS: 9 ADMISSION DATE:  11/20/2019, CONSULTATION DATE:  11/29/2019  REFERRING MD:  Aundra Dubin, MD, CHIEF COMPLAINT: Respiratory distress requiring BiPAP  BRIEF HISTORY:    58 year old smoker, diabetic, hypertensive admitted 12/12 for atrial fibrillation and congestive heart failure, found to have severely reduced EF less than 20% with global hypokinesis.  Cardiac-cath showed severe three-vessel disease.  He developed cardiogenic shock and respiratory distress requiring BiPAP 12/16 emergent placement of Impella LVAD  SIGNIFICANT PAST MEDICAL HISTORY    has a past medical history of Arthritis, COPD (chronic obstructive pulmonary disease) ([place]), Essential hypertension, and Type 2 diabetes mellitus ([place]).   has a past surgical history that includes Back surgery; Foot surgery; Hand surgery; RIGHT/LEFT HEART CATH AND CORONARY ANGIOGRAPHY (N/A, 11/24/2019); Placement of impella left ventricular assist device (N/A, 11/24/2019); and TEE without cardioversion (N/A, 11/24/2019).   SIGNIFICANT EVENTS:  12/16 R/LHC 12/16 to OR for impella  12/17 - Critically ill, intubated 12/18 - CABG x 4 + Maze 12/18  STUDIES:   12/12 CT angiogram negative for PE 12/13 echo EF less than 20%, global hypokinesis, RVSP 50 12/16 R/LHC >> severe three-vessel disease with proximal LAD stenosis.  Impella 5.5 placed 12/16 due to cardiogenic shock.   CULTURES:  SARS CoV2 12/12 > neg.  ANTIBIOTICS:    LINES/TUBES:  12/16 RUE PICC >> 12/16 RIJ swan >> L chest tubes >  R chest tube 12/20 >   CONSULTANTS:  TCTS CHF  SUBJECTIVE:  Remains on multiple drips though weaning (now off vaso, epi almost off). Remains on 3ppm of nitric, attempted to turn off overnight but had rise in CVP and drop in sats.  CONSTITUTIONAL: BP 118/77   Pulse 94   Temp 98.8 F (37.1 C) (Core)   Resp 18   Ht 5\' 10"  (1.778 m)   Wt 98.4 kg    SpO2 99%   BMI 31.13 kg/m   I/O last 3 completed shifts: In: 8174 [I.V.:5726.8; Blood:1440.8; Other:377.3; IV Piggyback:629.1] Out: 7563 [Urine:4175; Emesis/NG output:550; Chest Tube:2838]  PAP: (31-55)/(11-31) 37/20 CVP:  [6 mmHg-26 mmHg] 15 mmHg CO:  [6.7 L/min-7.3 L/min] 7.3 L/min CI:  [3.3 L/min/m2-3.6 L/min/m2] 3.6 L/min/m2  Vent Mode: PRVC FiO2 (%):  [40 %-60 %] 40 % Set Rate:  [18 bmp] 18 bmp Vt Set:  [580 mL] 580 mL PEEP:  [5 cmH20-7 cmH20] 5 cmH20 Plateau Pressure:  [22 cmH20-26 cmH20] 22 cmH20  Physical Exam: General: Adult male, critically ill, resting in bed, in NAD. Neuro: Sedated, not responsive. HEENT: [place]/AT. Sclerae anicteric. ETT in place. Cardiovascular: RRR, Impella hum noted. Bilateral chest tubes in place with bloody drainage. Lungs: Respirations even and unlabored.  CTA bilaterally, No W/R/R. Abdomen: BS x 4, soft, NT/ND.  Musculoskeletal: No gross deformities, no edema.  Skin: Intact, warm, no rashes.   ASSESSMENT AND PLAN    Acute hypoxemic respiratory failure. - Continue full vent support. - Continue lasix gtt for hopeful net neg balance. - Once has more output, can try weaning though does not appear ready for extubation given needs for nitric + multiple pressors etc.  Cardiogenic shock - s/p impella 12/16 and CABX with maze procedure 12/18. - Per TCTS, CHF team.  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 - Continue diuresis and iNO.  Probable underlying emphysema / COPD. - Start BD's (budesonide / brovana in lieu of home symbicort), atrovent scheduled, PRN levalbuterol.  Right hemothorax - s/p chest tube  12/20. - Per TCTS. - Continue to hold [facility]. - If continues to bleed, will need chest / impella site explored per TCTS / CHF teams.  A.fib with RVR - improved s/p maze and amio. - Continue amio. - Holding AC as above.  ABLA. - Maintain Hgb > 7.5.  Hx DM. - SSI.  Best Practice / Goals of Care / Disposition.   DVT PROPHYLAXIS: SCD's SUP:  Protonix NUTRITION: N.p.o. MOBILITY: Bedrest GOALS OF CARE: Aggressive care for now FAMILY DISCUSSIONS: per CVTS DISPOSITION ICU   CC time: 40 min.   Rutherford Guys, [place] [facility] 11/29/2019, 11:03 AM

## 2019-11-29 NOTE — Progress Notes (Signed)
Patient repositioned at 1530. At 1600, RN was documenting CT output and noticed right PT chest tube with 124ml and MT x2 with 217mls. Vitals remain stable. Dr. Orvan Seen notified. No new orders.  Joellen Jersey, RN

## 2019-11-29 NOTE — Progress Notes (Addendum)
Patient did not tolerate nitric off. O2 sats dropped to 88% on 60% FiO2, SPAP went from 30's to 50's, CVP went from 11 to 17. SBP dropped to the 80's . Nitric was restarted at 5.

## 2019-11-29 NOTE — Op Note (Signed)
CARDIOTHORACIC SURGERY OPERATIVE NOTE  Date of Procedure: 11/29/2019  Preoperative Diagnosis: Severe 3-vessel Coronary Artery Disease with severe heart failure due to biventricular dysfunction, s/p NSTEMI and Impella LVAD insertion  Postoperative Diagnosis: Same  Procedure:    Coronary Artery Bypass Grafting x 4  Left Internal Mammary Artery to Distal Left Anterior Descending Coronary Artery; Saphenous Vein Graft to Diagonal Branch Coronary Artery;  Sapheonous Vein Graft to Obtuse Marginal Branch of Left Circumflex Coronary Artery and Posterior Descending Coronary Artery as sequenced graft; Endoscopic Vein Harvest from right Thigh and Lower Leg Completion graft surveillance using indocyanine green fluorescence imaging (SPY) Bilateral pulmonary vein isolation with RFA energy and left atrial appendage clipping  Surgeon: B. Murvin Natal, MD  Assistant: Evonnie Pat PA-C  Anesthesia: get  Operative Findings:  Severely reduced left ventricular systolic function; moderately reduced right ventricular function  Good quality left internal mammary artery conduit  Good quality saphenous vein conduit  Good quality target vessels for grafting    BRIEF CLINICAL NOTE AND INDICATIONS FOR SURGERY  58 year old man presented with heart failure symptoms after experiencing atrial fibrillation.  His work-up demonstrated severe left ventricular dysfunction and underlying multivessel coronary artery disease.  He is status post LVAD insertion with Impella and has responded well to diuresis.  He is taken to the operating room for definitive coronary revascularization.   DETAILS OF THE OPERATIVE PROCEDURE  Preparation:  The patient is brought to the operating room on the above mentioned date and central monitoring was established by the anesthesia team including placement of Swan-Ganz catheter and radial arterial line. The patient is placed in the supine position on the operating table.  Intravenous  antibiotics are administered. General endotracheal anesthesia is induced uneventfully. A Foley catheter is placed.  Baseline transesophageal echocardiogram was performed.  Findings were notable for severely reduced LV function and moderately to severely reduced RV function; therefore he is started on inhaled nitric oxide as the case begins.  In addition, TEE demonstrates moderate to severe tricuspid regurgitation.  Consideration is made for repairing the tricuspid valve.  The patient's chest, abdomen, both groins, and both lower extremities are prepared and draped in a sterile manner. A time out procedure is performed.   Surgical Approach and Conduit Harvest:  A median sternotomy incision was performed and the left internal mammary artery is dissected from the chest wall and prepared for bypass grafting. The left internal mammary artery is notably good quality conduit. Simultaneously, the greater saphenous vein is obtained from the patient's right thigh using endoscopic vein harvest technique. The saphenous vein is notably good quality conduit. After removal of the saphenous vein, the small surgical incisions in the lower extremity are closed with absorbable suture. Following systemic heparinization, the left internal mammary artery was transected distally noted to have excellent flow.   Extracorporeal Cardiopulmonary Bypass and Myocardial Protection:  The pericardium is opened. The ascending aorta is nondiseased in appearance. The ascending aorta and the superior vena cava are cannulated for cardiopulmonary bypass.  Adequate heparinization is verified.   A retrograde cardioplegia cannula is placed through the right atrium into the coronary sinus.  The entire pre-bypass portion of the operation was notable for stable hemodynamics.  Cardiopulmonary bypass was begun; an additional venous cannula is inserted in the inferior vena cava.  The surface of the heart is inspected. Distal target vessels are  selected for coronary artery bypass grafting.  Next, the right-sided pulmonary veins are encircled and pulmonary vein isolation is created on the right side using  the RFA clamp.  A cardioplegia cannula is placed in the ascending aorta.  The patient is allowed to cooled to 28C systemic temperature.  A soft aortic cross clamp is applied after confirming the Impella motor was well towards the aortic valve itself; and cold blood cardioplegia is delivered initially in an antegrade fashion through the aortic root.   Supplemental cardioplegia is given retrograde through the coronary sinus catheter.  Iced saline slush is applied for topical hypothermia.  The initial cardioplegic arrest is rapid with early diastolic arrest.  Repeat doses of cardioplegia are administered intermittently throughout the entire cross clamp portion of the operation through the aortic root,  through the coronary sinus catheter, and through subsequently placed vein grafts in order to maintain completely flat electrocardiogram.  Next, the left-sided pulmonary veins are encircled after dividing the ligament of [place] with electrocautery.  RFA treatment of the left-sided veins is performed.  Following this, a clip was placed at the base of the left atrial appendage.  Attention is then turned to coronary grafting.    Coronary Artery Bypass Grafting:   The first obtuse marginal branch of the left circumflex coronary artery was grafted using a reversed saphenous vein graft in an end-to-side fashion.  At the site of distal anastomosis the target vessel was good quality and measured approximately 1.5 mm in diameter. Anastomotic patency and runoff was confirmed with indocyanine green fluorescence imaging (SPY).   The first diagonal branch of the left anterior descending coronary artery was grafted using a reversed saphenous vein graft in an end-to-side fashion.  At the site of distal anastomosis the target vessel was good quality and measured  approximately 1.5 mm in diameter. Anastomotic patency and runoff was confirmed with indocyanine green fluorescence imaging (SPY).  The  posterior descending branch of the right coronary artery was grafted using a reversed saphenous vein graft in an end-to-side fashion.  At the site of distal anastomosis the target vessel was good quality and measured approximately 1.5 mm in diameter. Anastomotic patency and runoff was confirmed with indocyanine green fluorescence imaging (SPY).  After grafting this vessel, it was clear that it would not reach the ascending aorta.  Therefore the distal anastomosis was taken down and reoriented such that its proximal anastomosis could be attached to the vein graft to the OM vessel at its hood.  This created a sequenced graft from the aorta to the OM and from the OM to the PDA   The distal left anterior coronary artery was grafted with the left internal mammary artery in an end-to-side fashion.  At the site of distal anastomosis the target vessel was good quality and measured approximately 2 mm in diameter. Anastomotic patency and runoff was confirmed with indocyanine green fluorescence imaging (SPY).  All proximal vein graft anastomoses were placed directly to the ascending aorta prior to removal of the aortic cross clamp.  De-airing procedures were performed and the aortic cross clamp was removed.  At this point the consideration for tricuspid valve repair was disregarded. Procedure Completion:  All proximal and distal coronary anastomoses were inspected for hemostasis and appropriate graft orientation. Epicardial pacing wires are fixed to the right ventricular outflow tract and to the right atrial appendage. The patient is rewarmed to 37C temperature. The patient is weaned and disconnected from cardiopulmonary bypass as Impella flows are increased.  The patient's rhythm at separation from bypass was nsr.  The patient was weaned from cardiopulmonary bypass  With moderate  inotropic support.  Followup transesophageal echocardiogram  performed after separation from bypass revealed no changes from the preoperative exam.  The aortic and venous cannula were removed uneventfully. Protamine was administered to reverse the anticoagulation. The mediastinum and pleural space were inspected for hemostasis and irrigated with saline solution. The mediastinum and bilateral pleural space were drained using fluted chest tubes placed through separate stab incisions inferiorly.  The soft tissues anterior to the aorta were reapproximated loosely. The sternum is closed with double strength sternal wire. The soft tissues anterior to the sternum were closed in multiple layers and the skin is closed with a running subcuticular skin closure.  The post-bypass portion of the operation was notable for stable rhythm and hemodynamics.    Disposition:  The patient tolerated the procedure well and is transported to the surgical intensive care in stable condition. There are no intraoperative complications. All sponge instrument and needle counts are verified correct at completion of the operation.    Brantley Fling, MD 11/29/2019 5:56 PM

## 2019-11-29 NOTE — Progress Notes (Signed)
While intubated patient verbalized to nurse that he wanted his wife [name] to leave and he did not want her here. RN informed wife that it would be best if she left until patient was able to verbalize with more detail. Tracy left the unit without issue. Patient extubated at 1645. Once able to talk he stated that [name] is "mean, spiteful, and hateful" and that he did not want her to come back to see him. He then asked to call his mom, which RN assisted with. Will discuss creating a password with patient so only his mom can receive info.   Joellen Jersey, RN

## 2019-11-29 NOTE — Procedures (Signed)
Extubation Procedure Note  Patient Details:   Name: Kevin Underwood DOB: [birth year] MRN: [id number]   Airway Documentation:    Vent end date: 11/29/19 Vent end time: 1650   Evaluation  O2 sats: stable throughout Complications: No apparent complications Patient did tolerate procedure well. Bilateral Breath Sounds: Clear, Diminished   Yes   RT called to patient room due to patient having a cuff leak.  Upon arrival patient noted to be losing exhaled volumes with audible leak.  Per MD, orders placed to extubate patient.  Patient extubated to 6L nasal cannula.  Sats currently 95%.  Patient able to speak post extubation.  Vitals are stable at this time.  No complications noted.  Will continue to monitor.   Judith Part 11/29/2019, 4:55 PM

## 2019-11-29 NOTE — Progress Notes (Signed)
Patient weaned off nitric, FiO2 increased to 60% due to drop in saturations. Will continue to monitor patient.

## 2019-11-29 NOTE — Progress Notes (Signed)
3 Days Post-Op Procedure(s) (LRB): CORONARY ARTERY BYPASS GRAFTING (CABG) using endoscopic greater saphenous vein harvest: svc to OM; svc to Diag; svc to PD; and LIMA to LAD. (N/A) TRANSESOPHAGEAL ECHOCARDIOGRAM (TEE) (N/A) INDOCYANINE GREEN FLUORESCENCE IMAGING (ICG) (N/A) MAZE using Bilateral Pulmonary Vein isolation. (N/A) Clipping Of Atrial Appendage using AtriCure 40 Clip (N/A) Subjective: Feeling better; "I've got this"  Objective: Vital signs in last 24 hours: Temp:  [97.7 F (36.5 C)-99 F (37.2 C)] 97.9 F (36.6 C) (12/21 1615) Pulse Rate:  [81-100] 94 (12/21 1654) Cardiac Rhythm: Atrial paced (12/21 0800) Resp:  [16-22] 18 (12/21 1654) BP: (90-135)/(65-86) 105/80 (12/21 1600) SpO2:  [91 %-100 %] 95 % (12/21 1654) Arterial Line BP: (78-148)/(53-78) 118/60 (12/21 1615) FiO2 (%):  [40 %-60 %] 40 % (12/21 1553) Weight:  [98.4 kg] 98.4 kg (12/21 0500)  Hemodynamic parameters for last 24 hours: PAP: (31-55)/(12-31) 36/18 CVP:  [7 mmHg-26 mmHg] 7 mmHg CO:  [5.1 L/min-7.3 L/min] 6 L/min CI:  [2.5 L/min/m2-3.6 L/min/m2] 2.9 L/min/m2  Intake/Output from previous day: 12/20 0701 - 12/21 0700 In: 5419.6 [I.V.:3914.6; Blood:726.8; IV Piggyback:529.3] Out: 5583 [Urine:2405; Emesis/NG output:450; Chest Tube:2728] Intake/Output this shift: Total I/O In: 1683.1 [I.V.:1008.7; Blood:406; Other:119.5; IV Piggyback:148.9] Out: 3080 [Urine:2100; Chest Tube:980]  General appearance: alert and cooperative Neurologic: intact Heart: regular rate and rhythm, S1, S2 normal, no murmur, click, rub or gallop Lungs: diminished breath sounds bilaterally Abdomen: soft, non-tender; bowel sounds normal; no masses,  no organomegaly Extremities: edema 2+  Lab Results: Recent Labs    11/29/19 0324 11/29/19 0559 11/29/19 1702  WBC 9.0  --  7.9  HGB 8.0* 7.1* 9.5*  HCT 23.6* 21.0* 28.0*  PLT 72*  --  73*   BMET:  Recent Labs    11/29/19 0324 11/29/19 0559 11/29/19 1702  NA 135 135 136   K 3.7 3.6 3.6  CL 103  --  101  CO2 25  --  26  GLUCOSE 129*  --  151*  BUN 13  --  11  CREATININE 0.65  --  0.98  CALCIUM 7.5*  --  7.7*    PT/INR: No results for input(s): LABPROT, INR in the last 72 hours. ABG    Component Value Date/Time   PHART 7.468 (H) 11/29/2019 0559   HCO3 26.3 11/29/2019 0559   TCO2 27 11/29/2019 0559   ACIDBASEDEF 6.0 (H) 11/24/2019 1718   O2SAT 79.8 11/29/2019 1743   CBG (last 3)  Recent Labs    11/29/19 0748 11/29/19 1128 11/29/19 1701  GLUCAP 144* 162* 154*    Assessment/Plan: S/P Procedure(s) (LRB): CORONARY ARTERY BYPASS GRAFTING (CABG) using endoscopic greater saphenous vein harvest: svc to OM; svc to Diag; svc to PD; and LIMA to LAD. (N/A) TRANSESOPHAGEAL ECHOCARDIOGRAM (TEE) (N/A) INDOCYANINE GREEN FLUORESCENCE IMAGING (ICG) (N/A) MAZE using Bilateral Pulmonary Vein isolation. (N/A) Clipping Of Atrial Appendage using AtriCure 40 Clip (N/A) Mobilize Diuresis extubate  Wean drips as tolerated oob to chair   LOS: 9 days    Wonda Olds 11/29/2019

## 2019-11-29 NOTE — Progress Notes (Signed)
Patient placed back on Nitric at 5ppm due to increase in CVP and PA pressures, decrease in BP and O2 saturations.

## 2019-11-29 NOTE — Progress Notes (Signed)
      [address]       [place] [address]             [phone]      Extubated  BP 115/81   Pulse 94   Temp (!) 96.4 F (35.8 C)   Resp 20   Ht 5\' 10"  (1.778 m)   Wt 98.4 kg   SpO2 99%   BMI 31.13 kg/m   Intake/Output Summary (Last 24 hours) at 11/29/2019 2010 Last data filed at 11/29/2019 1900 Gross per 24 hour  Intake 4011.39 ml  Output 5225 ml  Net -1213.61 ml   Impella with 4L flow  K= 3.4- supplement   Doing well   Remo Lipps C. Roxan Hockey, MD [facility] [phone]

## 2019-11-29 NOTE — Progress Notes (Signed)
RT NOTE: INO weaned down to 0 and turned off per MD. Vitals are stable. RT will continue to monitor.

## 2019-11-29 NOTE — Progress Notes (Signed)
Patient ID: Kevin Underwood, male   DOB: 09/21/1961, 58 y.o.   MRN: [id number]     Advanced Heart Failure Rounding Note  PCP-Cardiologist: Rozann Lesches, MD   Subjective:    - Impella 5.5 placed 12/16 due to cardiogenic shock.  - CABG x 4 + Maze 12/18  Remains intubated. Attempted to wean iNO overnight but sats dropped and CVP went up. Now back on at 5ppm.   Underwent R CT placement with 2L out yesterday. Heparin remains off systemically (running in Impella purge). hgb 8.7 -> 8.0 overnight   On NE 4 (down from 22), VP 0.02, epi 5, milrinone 0.25 and dopamine 2.5     Impella: Flow 4.1 L/min at P8, Waveforms look good. LDH 234  Swan numbers: CVP 16 PA 36/18 Thermo 7.3/3.6 Co-ox 56% -> 64% -> 67% PaPI  = 1.1   Objective:   Weight Range: 98.4 kg Body mass index is 31.13 kg/m.   Vital Signs:   Temp:  [98.1 F (36.7 C)-98.8 F (37.1 C)] 98.6 F (37 C) (12/21 0815) Pulse Rate:  [81-95] 94 (12/21 0815) Resp:  [16-23] 18 (12/21 0815) BP: (93-138)/(54-86) 126/82 (12/21 0800) SpO2:  [94 %-100 %] 100 % (12/21 0815) Arterial Line BP: (88-143)/(54-78) 131/62 (12/21 0815) FiO2 (%):  [50 %-60 %] 60 % (12/21 0423) Weight:  [98.4 kg] 98.4 kg (12/21 0500) Last BM Date: 11/23/19  Weight change: Filed Weights   11/27/19 0447 11/28/19 0500 11/29/19 0500  Weight: 97 kg 97 kg 98.4 kg    Intake/Output:   Intake/Output Summary (Last 24 hours) at 11/29/2019 0843 Last data filed at 11/29/2019 0803 Gross per 24 hour  Intake 5623.03 ml  Output 5538 ml  Net 85.03 ml      Physical Exam    General:  Awake on vent  Follows commands HEENT: normal + ETT Neck: supple. RIJ swan. Carotids 2+ bilat; no bruits. No lymphadenopathy or thryomegaly appreciated. Cor: Impella site with mild ooze  Sternal woound ok Regular rate & rhythm. No rubs, gallops or murmurs. + CTs Lungs: coarse Abdomen: soft, nontender, nondistended. No hepatosplenomegaly. No bruits or masses. Hypoactive bowel  sounds. Extremities: no cyanosis, clubbing, rash, 3+ edema. Diffuse ecchymoses down right side Neuro: Awake on vent  Follows commands   Telemetry   NSR 80-90s (personally reviewed)  Labs    CBC Recent Labs    11/28/19 1918 11/29/19 0324 11/29/19 0559  WBC 10.8* 9.0  --   HGB 8.7* 8.0* 7.1*  HCT 25.1* 23.6* 21.0*  MCV 83.9 84.3  --   PLT 74* 72*  --    Basic Metabolic Panel Recent Labs    11/28/19 0354 11/28/19 1918 11/29/19 0324 11/29/19 0559  NA 137 136 135 135  K 3.8 3.7 3.7 3.6  CL 102 104 103  --   CO2 '27 26 25  '$ --   GLUCOSE 132* 94 129*  --   BUN '16 14 13  '$ --   CREATININE 0.87 0.73 0.65  --   CALCIUM 7.7* 7.7* 7.5*  --   MG 2.1  --  1.9  --    Liver Function Tests Recent Labs    11/26/19 2120  AST 50*  ALT 31  ALKPHOS 42  BILITOT 1.3*  PROT 4.4*  ALBUMIN 2.8*   No results for input(s): LIPASE, AMYLASE in the last 72 hours. Cardiac Enzymes No results for input(s): CKTOTAL, CKMB, CKMBINDEX, TROPONINI in the last 72 hours.  BNP: BNP (last 3 results) Recent Labs  11/22/19 0414 11/23/19 0453 11/25/19 0500  BNP 578.0* 1,054.0* 596.1*    ProBNP (last 3 results) No results for input(s): PROBNP in the last 8760 hours.   D-Dimer No results for input(s): DDIMER in the last 72 hours. Hemoglobin A1C No results for input(s): HGBA1C in the last 72 hours. Fasting Lipid Panel Recent Labs    11/28/19 0354  TRIG 67   Thyroid Function Tests No results for input(s): TSH, T4TOTAL, T3FREE, THYROIDAB in the last 72 hours.  Invalid input(s): FREET3  Other results:   Imaging    DG Chest Port 1 View  Result Date: 11/29/2019 CLINICAL DATA:  Hypoxia EXAM: PORTABLE CHEST 1 VIEW COMPARISON:  November 28, 2019 FINDINGS: Endotracheal tube tip is 5.3 cm above the carina. Swan-Ganz catheter tip is in the main right pulmonary artery. Nasogastric tube tip and side port are below the diaphragm. There is a chest tube on each side as well as a mediastinal  drain. There is an Impella ventricular assist device, unchanged in position. There is a trace pneumothorax on the left. There is left lower lobe consolidation with left pleural effusion. There is mild right base atelectasis. There is cardiomegaly with pulmonary vascularity within normal limits. No adenopathy. There is aortic atherosclerosis. No bone lesions. IMPRESSION: Tube and catheter positions as described. Minimal left apical pneumothorax without tension component. There is left lower lobe consolidation with left pleural effusion. There is mild right base atelectasis. Right lung otherwise is clear. There is stable cardiomegaly. Aortic Atherosclerosis (ICD10-I70.0). Electronically Signed   By: Lowella Grip III M.D.   On: 11/29/2019 07:00   DG Chest Port 1 View  Result Date: 11/28/2019 CLINICAL DATA:  Evaluation of chest tube placement. EXAM: PORTABLE CHEST 1 VIEW COMPARISON:  November 28, 2019 FINDINGS: The PA catheter terminates in the right pulmonary outflow tract, stable. An NG tube terminates below today's film. An Impella device projects over the left ventricle. A right PICC line terminates near the caval atrial junction. The left-sided chest tube is stable. The chest tube on the right seen previously remains in stable position. There is increasing density around the tip of the chest tube on the right which has slowly increased since November 26, 2019 and may be in the pleural space. A new right-sided chest tube is been placed, in good position. No pneumothorax. Left-sided pleural effusion with underlying opacity, stable. No other interval changes. IMPRESSION: 1. There is increasing rounded density around the distal right chest tube which has progressed over the last several studies. This increasing density is favored to be loculated fluid in the pleural space. Recommend attention on follow-up. 2. The new right chest tube is in good position. 3. Other support apparatus as above. 4. Left-sided  pleural effusion with underlying opacity, stable. Electronically Signed   By: Dorise Bullion III M.D   On: 11/28/2019 13:01     Medications:     Scheduled Medications: . acetaminophen (TYLENOL) oral liquid 160 mg/5 mL  650 mg Per Tube Q6H  . aspirin  324 mg Per Tube Daily  . bisacodyl  10 mg Oral Daily   Or  . bisacodyl  10 mg Rectal Daily  . chlorhexidine gluconate (MEDLINE KIT)  15 mL Mouth Rinse BID  . Chlorhexidine Gluconate Cloth  6 each Topical Daily  . insulin aspart  0-20 Units Subcutaneous Q4H  . mouth rinse  15 mL Mouth Rinse 10 times per day  . pantoprazole (PROTONIX) IV  40 mg Intravenous QHS  . rosuvastatin  40 mg Per Tube q1800  . sodium chloride flush  3 mL Intravenous Q12H    Infusions: . sodium chloride Stopped (11/26/19 1843)  . sodium chloride    . sodium chloride 20 mL/hr at 11/26/19 1445  . amiodarone 30 mg/hr (11/29/19 0803)  . dexmedetomidine (PRECEDEX) IV infusion Stopped (11/26/19 1830)  . dextrose 5 % and 0.45% NaCl 40 mL/hr at 11/29/19 0836  . DOPamine 2.5 mcg/kg/min (11/29/19 0803)  . epinephrine 5 mcg/min (11/29/19 0803)  . fentaNYL infusion INTRAVENOUS 250 mcg/hr (11/29/19 0835)  . impella catheter heparin 50 unit/mL in dextrose 5%    . lactated ringers    . lactated ringers 20 mL/hr at 11/29/19 0803  . milrinone 0.25 mcg/kg/min (11/29/19 0803)  . norepinephrine (LEVOPHED) Adult infusion 4 mcg/min (11/29/19 0803)  . phenylephrine (NEO-SYNEPHRINE) Adult infusion    . potassium chloride 10 mEq (11/29/19 0751)  . propofol (DIPRIVAN) infusion 20 mcg/kg/min (11/29/19 0803)  . vasopressin (PITRESSIN) infusion - *FOR SHOCK* 0.02 Units/min (11/29/19 0803)    PRN Medications: sodium chloride, fentaNYL, metoprolol tartrate, midazolam, morphine injection, ondansetron (ZOFRAN) IV, oxyCODONE, sodium chloride flush, traMADol    Assessment/Plan   1. Acute systolic CHF => cardiogenic shock:  Echo showed EF < 20% with mildly decreased RV systolic  function. He was admitted with afib/RVR and CHF.  Cath with elevated filing pressures, low CO (CI 1.4 by RHC), and severe 3 vessel coronary disease.  Ischemic cardiomyopathy, possibly contribution from tachycardia with previous atrial fibrillation. Impella 5.5 placed due to cardiogenic shock on 12/16.  CABG on 12/18 with Maze.  - Remains on Impella P8 with good flow.   - He is on dopamine 2.5, epinephrine 5, milrinone 0.25, norepinephrine 4, vasopressin 0.02.  - Hemodynamics improved but PaPi remains low  - CVP up. Restart lasix gtt at 8  - Watch for ongoing bleeding - Give 1 more units RBCs - Continue to hold systemic heparin. Serial hgbs. Keep hgb > 7.5. If continues to bleed will need chest/impella site explored. D/w Dr.Atkins 2. Atrial fibrillation: With RVR at admission, back in NSR now.  Maze with surgery.  - Remains in NSR. Off heparin with bleed as above - Continue amio gtt 3. CAD: Severe 3VD in diabetic smoker.  He has exertional chest pain at baseline.  Was not admitted with ACS. Now s/p CABG.  - No s/s ischemia - Continue ASA 81 daily.  - rosuvastatin 40 daily.    4. ABLA from impella site - management as above 5. 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  Suspect group 2 + group 3 (COPD).  PA pressures have come down with diuresis.  6. Diabetes: Poor control.  Per IM, would eventually benefit from SGLT2 inhibitor.  7. OSA: Strongly suspected.  8. COPD: Suspected, former smoker.  Emphysema seen on CT chest.  9. ID: Afebrile, WBCs not elevated.  10. Acute hypoxemic respiratory failure: Intubated post-CABG.   - weaning slowly - failed wean of iNO. Will continue iNO 11. Thrombocytopenia: plts 85 -> 81 -> 72k today, suspect post-op.  12. PVCs/NSVT - continue amio - Keep mg > 2.0 K > 4.0 - watch for suction events 13. Hypokalemia - K 3.7 supp   CRITICAL CARE Performed by: Glori Bickers  Total critical care time: 45 minutes  Critical care  time was exclusive of separately billable procedures and treating other patients.  Critical care was necessary to treat or prevent imminent or life-threatening deterioration.  Critical care was time spent personally by me  on the following activities: development of treatment plan with patient and/or surrogate as well as nursing, discussions with consultants, evaluation of patient's response to treatment, examination of patient, obtaining history from patient or surrogate, ordering and performing treatments and interventions, ordering and review of laboratory studies, ordering and review of radiographic studies, pulse oximetry and re-evaluation of patient's condition.   Length of Stay: [place], MD  11/29/2019, 8:43 AM  Advanced Heart Failure Team Pager [phone] (M-F; 7a - 4p)  Please contact [facility] for night-coverage after hours (4p -7a ) and weekends on [email or web address]

## 2019-11-30 ENCOUNTER — Inpatient Hospital Stay (HOSPITAL_COMMUNITY): Payer: 59

## 2019-11-30 LAB — GLUCOSE, CAPILLARY
Glucose-Capillary: 125 mg/dL — ABNORMAL HIGH (ref 70–99)
Glucose-Capillary: 125 mg/dL — ABNORMAL HIGH (ref 70–99)
Glucose-Capillary: 150 mg/dL — ABNORMAL HIGH (ref 70–99)
Glucose-Capillary: 159 mg/dL — ABNORMAL HIGH (ref 70–99)
Glucose-Capillary: 144 mg/dL — ABNORMAL HIGH (ref 70–99)
Glucose-Capillary: 152 mg/dL — ABNORMAL HIGH (ref 70–99)

## 2019-11-30 LAB — POCT ACTIVATED CLOTTING TIME
Activated Clotting Time: 136 seconds
Activated Clotting Time: 142 seconds
Activated Clotting Time: 142 seconds
Activated Clotting Time: 142 seconds
Activated Clotting Time: 142 seconds
Activated Clotting Time: 136 seconds

## 2019-11-30 LAB — POCT I-STAT 7, (LYTES, BLD GAS, ICA,H+H)
Acid-Base Excess: 6 mmol/L — ABNORMAL HIGH (ref 0.0–2.0)
Acid-Base Excess: 6 mmol/L — ABNORMAL HIGH (ref 0.0–2.0)
Bicarbonate: 29.8 mmol/L — ABNORMAL HIGH (ref 20.0–28.0)
Bicarbonate: 29.5 mmol/L — ABNORMAL HIGH (ref 20.0–28.0)
Calcium, Ion: 1.1 mmol/L — ABNORMAL LOW (ref 1.15–1.40)
Calcium, Ion: 1.11 mmol/L — ABNORMAL LOW (ref 1.15–1.40)
HCT: 24 % — ABNORMAL LOW (ref 39.0–52.0)
HCT: 29 % — ABNORMAL LOW (ref 39.0–52.0)
Hemoglobin: 8.2 g/dL — ABNORMAL LOW (ref 13.0–17.0)
Hemoglobin: 9.9 g/dL — ABNORMAL LOW (ref 13.0–17.0)
O2 Saturation: 98 %
O2 Saturation: 93 %
Patient temperature: 36
Patient temperature: 36.6
Potassium: 2.9 mmol/L — ABNORMAL LOW (ref 3.5–5.1)
Potassium: 2.9 mmol/L — ABNORMAL LOW (ref 3.5–5.1)
Sodium: 135 mmol/L (ref 135–145)
Sodium: 134 mmol/L — ABNORMAL LOW (ref 135–145)
TCO2: 31 mmol/L (ref 22–32)
TCO2: 31 mmol/L (ref 22–32)
pCO2 arterial: 39.8 mmHg (ref 32.0–48.0)
pCO2 arterial: 35.9 mmHg (ref 32.0–48.0)
pH, Arterial: 7.479 — ABNORMAL HIGH (ref 7.350–7.450)
pH, Arterial: 7.521 — ABNORMAL HIGH (ref 7.350–7.450)
pO2, Arterial: 94 mmHg (ref 83.0–108.0)
pO2, Arterial: 58 mmHg — ABNORMAL LOW (ref 83.0–108.0)

## 2019-11-30 LAB — BASIC METABOLIC PANEL
Anion gap: 9 (ref 5–15)
Anion gap: 8 (ref 5–15)
Anion gap: 9 (ref 5–15)
BUN: 8 mg/dL (ref 6–20)
BUN: 7 mg/dL (ref 6–20)
BUN: 9 mg/dL (ref 6–20)
CO2: 26 mmol/L (ref 22–32)
CO2: 26 mmol/L (ref 22–32)
CO2: 27 mmol/L (ref 22–32)
Calcium: 7.5 mg/dL — ABNORMAL LOW (ref 8.9–10.3)
Calcium: 7.3 mg/dL — ABNORMAL LOW (ref 8.9–10.3)
Calcium: 7.5 mg/dL — ABNORMAL LOW (ref 8.9–10.3)
Chloride: 99 mmol/L (ref 98–111)
Chloride: 97 mmol/L — ABNORMAL LOW (ref 98–111)
Chloride: 95 mmol/L — ABNORMAL LOW (ref 98–111)
Creatinine, Ser: 0.74 mg/dL (ref 0.61–1.24)
Creatinine, Ser: 0.7 mg/dL (ref 0.61–1.24)
Creatinine, Ser: 0.63 mg/dL (ref 0.61–1.24)
GFR calc Af Amer: >60 mL/min (ref >60)
GFR calc Af Amer: >60 mL/min (ref >60)
GFR calc Af Amer: >60 mL/min (ref >60)
GFR calc non Af Amer: >60 mL/min (ref >60)
GFR calc non Af Amer: >60 mL/min (ref >60)
GFR calc non Af Amer: >60 mL/min (ref >60)
Glucose, Bld: 130 mg/dL — ABNORMAL HIGH (ref 70–99)
Glucose, Bld: 214 mg/dL — ABNORMAL HIGH (ref 70–99)
Glucose, Bld: 155 mg/dL — ABNORMAL HIGH (ref 70–99)
Potassium: 3.1 mmol/L — ABNORMAL LOW (ref 3.5–5.1)
Potassium: 3.2 mmol/L — ABNORMAL LOW (ref 3.5–5.1)
Potassium: 2.8 mmol/L — ABNORMAL LOW (ref 3.5–5.1)
Sodium: 134 mmol/L — ABNORMAL LOW (ref 135–145)
Sodium: 131 mmol/L — ABNORMAL LOW (ref 135–145)
Sodium: 131 mmol/L — ABNORMAL LOW (ref 135–145)

## 2019-11-30 LAB — COOXEMETRY PANEL
Carboxyhemoglobin: 1.6 % — ABNORMAL HIGH (ref 0.5–1.5)
Carboxyhemoglobin: 1.5 % (ref 0.5–1.5)
Methemoglobin: 0.4 % (ref 0.0–1.5)
Methemoglobin: 0.6 % (ref 0.0–1.5)
O2 Saturation: 79.9 %
O2 Saturation: 62.3 %
Total hemoglobin: 8.3 g/dL — ABNORMAL LOW (ref 12.0–16.0)
Total hemoglobin: 10.1 g/dL — ABNORMAL LOW (ref 12.0–16.0)

## 2019-11-30 LAB — CBC
HCT: 27.2 % — ABNORMAL LOW (ref 39.0–52.0)
HCT: 26.4 % — ABNORMAL LOW (ref 39.0–52.0)
HCT: 28 % — ABNORMAL LOW (ref 39.0–52.0)
Hemoglobin: 9.3 g/dL — ABNORMAL LOW (ref 13.0–17.0)
Hemoglobin: 8.9 g/dL — ABNORMAL LOW (ref 13.0–17.0)
Hemoglobin: 9.6 g/dL — ABNORMAL LOW (ref 13.0–17.0)
MCH: 29.2 pg (ref 26.0–34.0)
MCH: 29.2 pg (ref 26.0–34.0)
MCH: 29.1 pg (ref 26.0–34.0)
MCHC: 34.2 g/dL (ref 30.0–36.0)
MCHC: 33.7 g/dL (ref 30.0–36.0)
MCHC: 34.3 g/dL (ref 30.0–36.0)
MCV: 85.3 fL (ref 80.0–100.0)
MCV: 86.6 fL (ref 80.0–100.0)
MCV: 84.8 fL (ref 80.0–100.0)
Platelets: 71 10*3/uL — ABNORMAL LOW (ref 150–400)
Platelets: 71 10*3/uL — ABNORMAL LOW (ref 150–400)
Platelets: 76 10*3/uL — ABNORMAL LOW (ref 150–400)
RBC: 3.19 MIL/uL — ABNORMAL LOW (ref 4.22–5.81)
RBC: 3.05 MIL/uL — ABNORMAL LOW (ref 4.22–5.81)
RBC: 3.3 MIL/uL — ABNORMAL LOW (ref 4.22–5.81)
RDW: 15.1 % (ref 11.5–15.5)
RDW: 15.2 % (ref 11.5–15.5)
RDW: 14.7 % (ref 11.5–15.5)
WBC: 7.3 10*3/uL (ref 4.0–10.5)
WBC: 6.8 10*3/uL (ref 4.0–10.5)
WBC: 6.8 10*3/uL (ref 4.0–10.5)
nRBC: 0 % (ref 0.0–0.2)
nRBC: 0 % (ref 0.0–0.2)
nRBC: 0 % (ref 0.0–0.2)

## 2019-11-30 LAB — LACTATE DEHYDROGENASE: LDH: 286 U/L — ABNORMAL HIGH (ref 98–192)

## 2019-11-30 LAB — MAGNESIUM: Magnesium: 1.6 mg/dL — ABNORMAL LOW (ref 1.7–2.4)

## 2019-11-30 LAB — PREPARE RBC (CROSSMATCH)

## 2019-11-30 MED ORDER — HEPARIN (PORCINE) 25000 UT/250ML-% IV SOLN
100.0000 [IU]/h | INTRAVENOUS | Status: DC
  Administered 2019-11-30 – 2019-12-02 (×2): 500 [IU]/h via INTRAVENOUS
  Filled 2019-11-30 (×3): qty 250

## 2019-11-30 MED ORDER — MAGNESIUM SULFATE 4 GM/100ML IV SOLN
4.0000 g | Freq: Once | INTRAVENOUS | Status: AC
  Administered 2019-11-30: 10:00:00 4 g via INTRAVENOUS
  Filled 2019-11-30: qty 100

## 2019-11-30 MED ORDER — SODIUM CHLORIDE 0.9% IV SOLUTION: Freq: Once | INTRAVENOUS | Status: AC

## 2019-11-30 MED ORDER — ASPIRIN 81 MG PO CHEW
81.0000 mg | CHEWABLE_TABLET | Freq: Every day | ORAL | Status: DC
  Administered 2019-12-01 – 2019-12-08 (×8): 81 mg via ORAL
  Filled 2019-11-30 (×8): qty 1

## 2019-11-30 MED ORDER — POTASSIUM CHLORIDE 10 MEQ/50ML IV SOLN
10.0000 meq | INTRAVENOUS | Status: AC
  Administered 2019-11-30 (×3): 10 meq via INTRAVENOUS
  Filled 2019-11-30 (×3): qty 50

## 2019-11-30 MED ORDER — ASPIRIN EC 81 MG PO TBEC: 81.0000 mg | DELAYED_RELEASE_TABLET | Freq: Every day | ORAL | Status: DC

## 2019-11-30 MED ORDER — COLCHICINE 0.3 MG HALF TABLET
0.3000 mg | ORAL_TABLET | Freq: Two times a day (BID) | ORAL | Status: DC
  Administered 2019-11-30 – 2019-12-01 (×4): 0.3 mg via ORAL
  Filled 2019-11-30 (×5): qty 1

## 2019-11-30 MED ORDER — SENNOSIDES-DOCUSATE SODIUM 8.6-50 MG PO TABS
1.0000 | ORAL_TABLET | Freq: Every day | ORAL | Status: DC
  Administered 2019-11-30 – 2019-12-08 (×9): 1 via ORAL
  Filled 2019-11-30 (×8): qty 1

## 2019-11-30 MED ORDER — ROSUVASTATIN CALCIUM 20 MG PO TABS
40.0000 mg | ORAL_TABLET | Freq: Every day | ORAL | Status: DC
  Administered 2019-12-01 – 2019-12-07 (×7): 40 mg via ORAL
  Filled 2019-11-30 (×8): qty 2

## 2019-11-30 MED ORDER — POTASSIUM CHLORIDE 10 MEQ/50ML IV SOLN
10.0000 meq | INTRAVENOUS | Status: AC
  Administered 2019-11-30 (×3): 10 meq via INTRAVENOUS
  Filled 2019-11-30 (×3): qty 50

## 2019-11-30 MED ORDER — ASPIRIN 81 MG PO CHEW
324.0000 mg | CHEWABLE_TABLET | Freq: Every day | ORAL | Status: DC
  Administered 2019-11-30: 324 mg via ORAL

## 2019-11-30 MED ORDER — MILRINONE LACTATE IN DEXTROSE 20-5 MG/100ML-% IV SOLN
0.2500 ug/kg/min | INTRAVENOUS | Status: DC
  Administered 2019-11-30 – 2019-12-05 (×10): 0.25 ug/kg/min via INTRAVENOUS
  Filled 2019-11-30 (×10): qty 100

## 2019-11-30 MED ORDER — POTASSIUM CHLORIDE 10 MEQ/50ML IV SOLN
10.0000 meq | INTRAVENOUS | Status: AC
  Administered 2019-11-30 (×3): 10 meq via INTRAVENOUS
  Filled 2019-11-30: qty 50

## 2019-11-30 MED ORDER — CLOPIDOGREL BISULFATE 75 MG PO TABS
75.0000 mg | ORAL_TABLET | Freq: Every day | ORAL | Status: DC
  Administered 2019-11-30 – 2019-12-08 (×9): 75 mg via ORAL
  Filled 2019-11-30 (×9): qty 1

## 2019-11-30 MED ORDER — ENSURE ENLIVE PO LIQD
237.0000 mL | Freq: Three times a day (TID) | ORAL | Status: DC
  Administered 2019-11-30 – 2019-12-06 (×7): 237 mL via ORAL

## 2019-11-30 MED ORDER — POTASSIUM CHLORIDE 10 MEQ/50ML IV SOLN
10.0000 meq | INTRAVENOUS | Status: AC
  Administered 2019-11-30 (×3): 10 meq via INTRAVENOUS
  Filled 2019-11-30 (×3): qty 50

## 2019-11-30 MED ORDER — ADULT MULTIVITAMIN W/MINERALS CH
1.0000 | ORAL_TABLET | Freq: Every day | ORAL | Status: DC
  Administered 2019-12-01 – 2019-12-08 (×8): 1 via ORAL
  Filled 2019-11-30 (×8): qty 1

## 2019-11-30 NOTE — Progress Notes (Signed)
  ANTICOAGULATION CONSULT NOTE  Pharmacy Consult for Heparin Indication: Impella  No Known Allergies  Patient Measurements: Height: 5\' 10"  (177.8 cm) Weight: 214 lb 8.1 oz (97.3 kg) IBW/kg (Calculated) : 73 Heparin Dosing Weight: 85.3 kg  Vital Signs: Temp: 97.3 F (36.3 C) (12/22 1430) Temp Source: Core (12/22 0800) BP: 99/72 (12/22 1400) Pulse Rate: 73 (12/22 1430)  Labs: Recent Labs    11/28/19 0354 11/28/19 0558 11/29/19 0324 11/29/19 1702 11/30/19 0013 11/30/19 0407 11/30/19 0453 11/30/19 1322  HGB 8.7*  --  8.0* 9.5* 9.3* 8.9* 8.2* 9.6*  HCT 26.2*  --  23.6* 28.0* 27.2* 26.4* 24.0* 28.0*  PLT 87*   < > 72* 73* 71* 71*  --  76*  HEPARINUNFRC 0.68  --  <0.10*  --   --   --   --   --   CREATININE 0.87   < > 0.65 0.98  --  0.74  --  0.70   < > = values in this interval not displayed.    Estimated Creatinine Clearance: 117.7 mL/min (by C-G formula based on SCr of 0.7 mg/dL).   Medical History: Past Medical History:  Diagnosis Date  . Arthritis   . COPD (chronic obstructive pulmonary disease) ([place])   . Essential hypertension   . Type 2 diabetes mellitus [facility])     Assessment: 58 year old male admitted with newly documented cardiomyopathy with paroxysmal atrial fibrillation with RVR. Patient had progressively worsening hypoxia and mental status over the course of the day requiring emergent Impella placement in the OR and intubation. Heparin being started post Impella. Patient now s/p CABG 12/18, he continues with Impella.   Heparin has continued through purge, systemic heparin turned off 12/20 d/t drop in hgb. Hgb improved today to 9.6 after receiving blood. Discussed with surgery and ok to restart low dose systemic heparin at rate no greater than 500 units/hr. Will order daily heparin level to rule out accumulation but per surgery will not plan on titrating.   Goal of Therapy:  Target ACT: 160-180 seconds Monitor platelets by anticoagulation protocol: Yes    Plan:  Continue systemic heparin at rate no greater than 500 units/hr Follow cbc daily  Erin Hearing PharmD., BCPS Clinical Pharmacist 11/30/2019 3:05 PM

## 2019-11-30 NOTE — Progress Notes (Signed)
Patient ID: Kevin Underwood, male   DOB: [birth year], 58 y.o.   MRN: [id number]     Advanced Heart Failure Rounding Note  PCP-Cardiologist: Rozann Lesches, MD   Subjective:    - Impella 5.5 placed 12/16 due to cardiogenic shock.  - CABG x 4 + Maze 12/18  Now extubated. Still SOB.   Remains on Impella at P-6 with flows 3.4L (waveforms ok). Dropped to P-4 earlier today but MAP dropped and turned back up. Also on NE 4, Dopa 1 and milrinone 0.25  Diuresing well but weight up 30-40 pounds   Swan numbers: CVP 5 PA 32/14 Thermo 5.1/2.5 Co-ox 62% PaPI  = 3.5   Objective:   Weight Range: 97.3 kg Body mass index is 30.78 kg/m.   Vital Signs:   Temp:  [95.2 F (35.1 C)-98.2 F (36.8 C)] 98.2 F (36.8 C) (12/22 1830) Pulse Rate:  [72-118] 82 (12/22 1830) Resp:  [15-30] 27 (12/22 1830) BP: (84-121)/(63-88) 113/69 (12/22 1800) SpO2:  [79 %-100 %] 89 % (12/22 1830) Arterial Line BP: (98-144)/(45-70) 119/56 (12/22 1830) Weight:  [97.3 kg] 97.3 kg (12/22 0500) Last BM Date: 11/23/19  Weight change: Filed Weights   11/28/19 0500 11/29/19 0500 11/30/19 0500  Weight: 97 kg 98.4 kg 97.3 kg    Intake/Output:   Intake/Output Summary (Last 24 hours) at 11/30/2019 1857 Last data filed at 11/30/2019 1832 Gross per 24 hour  Intake 2156.33 ml  Output 6710 ml  Net -4553.67 ml      Physical Exam    General:  Sitting up in bed. Weak appearing No resp difficulty HEENT: normal Neck: supple. RIJ swan Carotids 2+ bilat; no bruits. No lymphadenopathy or thryomegaly appreciated. Cor: PMI nondisplaced. Regular rate & rhythm. No rubs, gallops or murmurs. R chest tube with serous drainage Impella site Lungs: coarse Abdomen: soft, nontender, + distended. No hepatosplenomegaly. No bruits or masses. Good bowel sounds. Extremities: no cyanosis, clubbing, rash, 3+ edema Neuro: alert & orientedx3, cranial nerves grossly intact. moves all 4 extremities w/o difficulty. Affect  pleasant   Telemetry   NSR 80-90s (personally reviewed)  Labs    CBC Recent Labs    11/30/19 0407 11/30/19 1322 11/30/19 1640  WBC 6.8 6.8  --   HGB 8.9* 9.6* 9.9*  HCT 26.4* 28.0* 29.0*  MCV 86.6 84.8  --   PLT 71* 76*  --    Basic Metabolic Panel Recent Labs    11/29/19 0324 11/29/19 0559 11/30/19 0407 11/30/19 1322 11/30/19 1640 11/30/19 1705  NA 135  --  134* 131* 134* 131*  K 3.7  --  3.1* 3.2* 2.9* 2.8*  CL 103   < > 99 97*  --  95*  CO2 25   < > 26 26  --  27  GLUCOSE 129*   < > 130* 214*  --  155*  BUN 13   < > 8 7  --  9  CREATININE 0.65   < > 0.74 0.70  --  0.63  CALCIUM 7.5*   < > 7.5* 7.3*  --  7.5*  MG 1.9  --  1.6*  --   --   --    < > = values in this interval not displayed.   Liver Function Tests No results for input(s): AST, ALT, ALKPHOS, BILITOT, PROT, ALBUMIN in the last 72 hours. No results for input(s): LIPASE, AMYLASE in the last 72 hours. Cardiac Enzymes No results for input(s): CKTOTAL, CKMB, CKMBINDEX, TROPONINI in the last 72  hours.  BNP: BNP (last 3 results) Recent Labs    11/22/19 0414 11/23/19 0453 11/25/19 0500  BNP 578.0* 1,054.0* 596.1*    ProBNP (last 3 results) No results for input(s): PROBNP in the last 8760 hours.   D-Dimer No results for input(s): DDIMER in the last 72 hours. Hemoglobin A1C No results for input(s): HGBA1C in the last 72 hours. Fasting Lipid Panel Recent Labs    11/29/19 1702  TRIG 90   Thyroid Function Tests No results for input(s): TSH, T4TOTAL, T3FREE, THYROIDAB in the last 72 hours.  Invalid input(s): FREET3  Other results:   Imaging    DG CHEST PORT 1 VIEW  Result Date: 11/30/2019 CLINICAL DATA:  Chest tube.  Status post CABG. EXAM: PORTABLE CHEST 1 VIEW COMPARISON:  11/29/2019 FINDINGS: 0529 hours. Endotracheal tube and NG tube have been removed in the interval. Right IJ pulmonary artery catheter tip is in the distal right main, potentially inter lobar right pulmonary  artery. Right PICC line tip projects near the SVC/RA junction. Bilateral chest tubes again noted with mediastinal/pericardial drains at the midline. Impella left ventricular assist device again noted. Tiny left apical pneumothorax noted on the prior exam is not readily evident on today's study. No pleural line at the lower right apex to suggest pneumothorax. Nodular opacity in the right mid lung peripherally likely related to fluid in the minor fissure. There is persistent left base collapse/consolidation with effusion. IMPRESSION: Interval extubation with NG tube removal. Tiny left apical pneumothorax seen on the previous study not evident today. Cardiomegaly with retrocardiac left base collapse/consolidation and effusion. Support apparatus as above. Electronically Signed   By: Misty Stanley M.D.   On: 11/30/2019 07:32     Medications:     Scheduled Medications: . sodium chloride   Intravenous Once  . acetaminophen  650 mg Oral Q6H  . arformoterol  15 mcg Nebulization BID  . [START ON 12/01/2019] aspirin  81 mg Oral Daily  . budesonide (PULMICORT) nebulizer solution  0.5 mg Nebulization BID  . chlorhexidine gluconate (MEDLINE KIT)  15 mL Mouth Rinse BID  . Chlorhexidine Gluconate Cloth  6 each Topical Daily  . clopidogrel  75 mg Oral Daily  . colchicine  0.3 mg Oral BID  . feeding supplement (ENSURE ENLIVE)  237 mL Oral TID BM  . insulin aspart  0-20 Units Subcutaneous Q4H  . ipratropium  0.5 mg Nebulization Q6H  . mouth rinse  15 mL Mouth Rinse BID  . multivitamin with minerals  1 tablet Oral Daily  . pantoprazole (PROTONIX) IV  40 mg Intravenous QHS  . rosuvastatin  40 mg Oral q1800  . senna-docusate  1 tablet Oral Daily  . sodium chloride flush  3 mL Intravenous Q12H    Infusions: . sodium chloride Stopped (11/30/19 1732)  . sodium chloride    . sodium chloride 20 mL/hr at 11/26/19 1445  . amiodarone 30 mg/hr (11/30/19 1800)  . dextrose 5 % and 0.45% NaCl Stopped (11/29/19 1331)   . DOPamine 1 mcg/kg/min (11/30/19 1800)  . epinephrine Stopped (11/29/19 1252)  . furosemide (LASIX) infusion Stopped (11/30/19 1320)  . impella catheter heparin 50 unit/mL in dextrose 5%    . heparin 500 Units/hr (11/30/19 1800)  . lactated ringers    . lactated ringers Stopped (11/29/19 1353)  . milrinone 0.25 mcg/kg/min (11/30/19 1800)  . norepinephrine (LEVOPHED) Adult infusion 4 mcg/min (11/30/19 1800)  . potassium chloride 10 mEq (11/30/19 1841)  . potassium chloride  PRN Medications: sodium chloride, fentaNYL, levalbuterol, metoprolol tartrate, morphine injection, ondansetron (ZOFRAN) IV, oxyCODONE, sodium chloride flush, traMADol    Assessment/Plan   1. Acute systolic CHF => cardiogenic shock:  Echo showed EF < 20% with mildly decreased RV systolic function. He was admitted with afib/RVR and CHF.  Cath with elevated filing pressures, low CO (CI 1.4 by RHC), and severe 3 vessel coronary disease.  Ischemic cardiomyopathy, possibly contribution from tachycardia with previous atrial fibrillation. Impella 5.5 placed due to cardiogenic shock on 12/16.  CABG on 12/18 with Maze.  - Remains on Impella P6 with good flow.   - He is on dopamine 1, norepinephrine 3, milrinone 0.25 Co-ox 64% - PaPI improved - Remains massively volume overloaded. Would not decrease hemodynamic support further until we have better diuresis. Restart lasix gtt at 4 (was turned off earlier today) 2. Atrial fibrillation: With RVR at admission, back in NSR now.  Maze with surgery.  - Remains in NSR. On low-dose heparin. hgb stable at 9.9 - Continue amio gtt 3. CAD: Severe 3VD in diabetic smoker.  He has exertional chest pain at baseline.  Was not admitted with ACS. Now s/p CABG.  - No s/s ischemia - Continue ASA 81 daily.  - rosuvastatin 40 daily.    4. ABLA from impella site - appears stable.  - tolarating low-dose heparin. Would not go higher 5. Pulmonary hypertension: Mixed pulmonary venous/pulmonary  arterial HTN.  Suspect group 2 + group 3 (COPD).  PA pressures have come down with diuresis.  6. Diabetes: Poor control.  Per IM, would eventually benefit from SGLT2 inhibitor.  7. OSA: Strongly suspected.  8. COPD: Suspected, former smoker.  Emphysema seen on CT chest.  9. ID: Afebrile, WBCs not elevated.  10. Acute hypoxemic respiratory failure: Intubated post-CABG.   - now extubated - continue low-dose iNO 11. Thrombocytopenia: plts 85 -> 81 -> 72k -> 76K  12. PVCs/NSVT - continue amio - Keep mg > 2.0 K > 4.0 - watch for suction events 13. Hypokalemia - K 2.8 supp  - add spiro 25   CRITICAL CARE Performed by: Glori Bickers  Total critical care time: 40 minutes  Critical care time was exclusive of separately billable procedures and treating other patients.  Critical care was necessary to treat or prevent imminent or life-threatening deterioration.  Critical care was time spent personally by me on the following activities: development of treatment plan with patient and/or surrogate as well as nursing, discussions with consultants, evaluation of patient's response to treatment, examination of patient, obtaining history from patient or surrogate, ordering and performing treatments and interventions, ordering and review of laboratory studies, ordering and review of radiographic studies, pulse oximetry and re-evaluation of patient's condition.   Length of Stay: [place], MD  11/30/2019, 6:57 PM  Advanced Heart Failure Team Pager [phone] (M-F; 7a - 4p)  Please contact [facility] for night-coverage after hours (4p -7a ) and weekends on [email or web address]

## 2019-11-30 NOTE — Progress Notes (Signed)
Patient ID: Kevin Underwood, male   DOB: 1961-03-24, 58 y.o.   MRN: [id number] T[facility] Evening Rounds:  Hemodynamically stable on NE 4, milrinone 0.25, dop 1 Impella increased to P4 today. Co-ox 62 this pm.  Diuresed well but lasix drip stopped due to low CVP and drop in MAP.   BMET    Component Value Date/Time   NA 131 (L) 11/30/2019 1705   K 2.8 (L) 11/30/2019 1705   CL 95 (L) 11/30/2019 1705   CO2 27 11/30/2019 1705   GLUCOSE 155 (H) 11/30/2019 1705   BUN 9 11/30/2019 1705   CREATININE 0.63 11/30/2019 1705   CALCIUM 7.5 (L) 11/30/2019 1705   GFRNONAA >60 11/30/2019 1705   GFRAA >60 11/30/2019 1705    Replete K+   CBC    Component Value Date/Time   WBC 6.8 11/30/2019 1322   RBC 3.30 (L) 11/30/2019 1322   HGB 9.9 (L) 11/30/2019 1640   HCT 29.0 (L) 11/30/2019 1640   PLT 76 (L) 11/30/2019 1322   MCV 84.8 11/30/2019 1322   MCH 29.1 11/30/2019 1322   MCHC 34.3 11/30/2019 1322   RDW 14.7 11/30/2019 1322   LYMPHSABS 2.4 11/23/2019 0453   MONOABS 0.9 11/23/2019 0453   EOSABS 0.0 11/23/2019 0453   BASOSABS 0.1 11/23/2019 0453

## 2019-11-30 NOTE — Progress Notes (Signed)
Nutrition Follow-up   DOCUMENTATION CODES:   Not applicable  INTERVENTION:   Monitor for BM (day 7 without)    Ensure Enlive po TID, each supplement provides 350 kcal and 20 grams of protein  MVI daily  NUTRITION DIAGNOSIS:   Inadequate oral intake related to inability to eat as evidenced by NPO status.   Ongoing  GOAL:   Patient will meet greater than or equal to 90% of their needs   Diet just advanced   MONITOR:   Vent status, Labs, Weight trends, Skin  REASON FOR ASSESSMENT:   Ventilator  ASSESSMENT:   Patient is a 58 yo male with recent hospital stay for pneumonia. Hx of HTN, DM-2 (A1C-12.1%), COPD and arthirits. Newly diagnosed cardiomyopathy.   12/16- s/p R/L heart cath, impella  12/18- s/p CABG x4 12/21- extubated   Pt discussed during ICU rounds and with RN.   Pressor requirement decreasing. Remain fluid overloaded. Diet advanced to fulls this am. Discussed the importance of protein intake for preservation of lean body mass and to promote post-op healing. Pt eager to eat "real food" and is amendable to Ensure. Has been without BM x 7 days- regimen in place.   Admission weight: 88.1 kg  Current weight: 97.3 kg    I/O: -3,960 ml since admit UOP: 5,325 ml x 24 hrs  Chest tubes: 2,060 ml x 24 hrs    Drips: amiodarone, dopamine, 250 mg lasix in D5 @ 8 ml/hr, milrinone   Medications: SS novolog, MVI with minerals, senokot Labs: K 2.9 (L) Mg 1.6 (L) CBG 63-170   Diet Order:   Diet Order            Diet full liquid Room service appropriate? Yes; Fluid consistency: Thin  Diet effective now              EDUCATION NEEDS:   Education needs have been addressed  Skin:  Skin Assessment: Skin Integrity Issues: Skin Integrity Issues:: Incisions Incisions: R axilla (12/16); R leg and abdomen (12/18)  Last BM:  12/15  Height:   Ht Readings from Last 1 Encounters:  11/24/19 5\' 10"  (1.778 m)    Weight:   Wt Readings from Last 1 Encounters:   11/30/19 97.3 kg    Ideal Body Weight:  75.4 kg  BMI:  Body mass index is 30.78 kg/m.  Estimated Nutritional Needs:   Kcal:  2250-2450 kcal  Protein:  115-130 grams  Fluid:  >/= 2.2 L/day   Mariana Single RD, LDN Clinical Nutrition Pager # - [phone]

## 2019-11-30 NOTE — Progress Notes (Signed)
Remainder  of fentanyl gtt wasted with Aurelio Jew RN in Loss adjuster, chartered

## 2019-11-30 NOTE — Progress Notes (Signed)
PULMONARY / CRITICAL CARE MEDICINE   NAME:  Kevin Underwood, MRN:  [id number], DOB:  1961-08-23, LOS: 10 ADMISSION DATE:  11/20/2019, CONSULTATION DATE:  11/30/2019  REFERRING MD:  Shirlee Latch, MD, CHIEF COMPLAINT: Respiratory distress requiring BiPAP  BRIEF HISTORY:    58 year old smoker, diabetic, hypertensive admitted 12/12 for atrial fibrillation and congestive heart failure, found to have severely reduced EF less than 20% with global hypokinesis.  Cardiac-cath showed severe three-vessel disease.  He developed cardiogenic shock and respiratory distress requiring BiPAP 12/16 emergent placement of Impella LVAD  SIGNIFICANT PAST MEDICAL HISTORY    has a past medical history of Arthritis, COPD (chronic obstructive pulmonary disease) (HCC), Essential hypertension, and Type 2 diabetes mellitus (HCC).   has a past surgical history that includes Back surgery; Foot surgery; Hand surgery; RIGHT/LEFT HEART CATH AND CORONARY ANGIOGRAPHY (N/A, 11/24/2019); Placement of impella left ventricular assist device (N/A, 11/24/2019); TEE without cardioversion (N/A, 11/24/2019); Coronary artery bypass graft (N/A, 11/26/2019); TEE without cardioversion (N/A, 11/26/2019); MAZE (N/A, 11/26/2019); and Clipping of atrial appendage (N/A, 11/26/2019).   SIGNIFICANT EVENTS:  12/16 R/LHC 12/16 to OR for impella  12/17 - Critically ill, intubated 12/18 - CABG x 4 + Maze 12/18 11/29/2019 extubated STUDIES:   12/12 CT angiogram negative for PE 12/13 echo EF less than 20%, global hypokinesis, RVSP 50 12/16 R/LHC >> severe three-vessel disease with proximal LAD stenosis.  Impella 5.5 placed 12/16 due to cardiogenic shock.   CULTURES:  SARS CoV2 12/12 > neg.  ANTIBIOTICS:    LINES/TUBES:  12/16 RUE PICC >> 12/16 RIJ swan >> L chest tubes >  R chest tube 12/20 >   CONSULTANTS:  TCTS CHF  SUBJECTIVE:  Remains on multiple drips though weaning (now off vaso, epi almost off). Remains on 3ppm of nitric, attempted  to turn off overnight but had rise in CVP and drop in sats.  CONSTITUTIONAL: BP 121/88   Pulse (!) 118   Temp (!) 96.6 F (35.9 C)   Resp (!) 23   Ht 5\' 10"  (1.778 m)   Wt 97.3 kg   SpO2 100%   BMI 30.78 kg/m   I/O last 3 completed shifts: In: 4948.1 [P.O.:50; I.V.:3715.4; Blood:406; Other:377; IV Piggyback:399.7] Out: 9265 [Urine:6365; Emesis/NG output:450; Chest Tube:2450]  PAP: (27-45)/(13-24) 35/20 CVP:  [5 mmHg-15 mmHg] 5 mmHg PCWP:  [9 mmHg] 9 mmHg CO:  [5 L/min-6.9 L/min] 6 L/min CI:  [2.5 L/min/m2-3.4 L/min/m2] 2.9 L/min/m2  Vent Mode: PRVC FiO2 (%):  [40 %] 40 % Set Rate:  [18 bmp] 18 bmp Vt Set:  [580 mL] 580 mL PEEP:  [5 cmH20] 5 cmH20 Plateau Pressure:  [20 cmH20-21 cmH20] 20 cmH20  Physical Exam: General: Ill-appearing male HEENT: No JVD or lymphadenopathy is appreciated Neuro: Grossly intact CV: Heart sounds are regular whirring sound of LVAD noted PULM: Diminished in the bases shallow respirations  GI: soft, bsx4 active  Extremities: warm/dry, 2+ edema  Skin: Sternotomy incision is well approximated    ASSESSMENT AND PLAN    Acute hypoxemic respiratory failure. Extubated 11/29/2019 Pulmonary toilet Bronchodilators Pulmonary critical care available as needed.  Cardiogenic shock - s/p impella 12/16 and CABX with maze procedure 12/18. 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 Continue diuresis and strict intake and output  Probable underlying emphysema / COPD. Continue bronchodilators  Right hemothorax - s/p chest tube 12/20. Per thoracic surgery  A.fib with RVR - improved s/p maze and amio. Per cards  Anemia Recent Labs    11/30/19 0407  11/30/19 0453  HGB 8.9* 8.2*    Monitor and transfuse per protocol  Hx DM. Sliding scale insulin protocol  Best Practice / Goals of Care / Disposition.   DVT PROPHYLAXIS: SCD's SUP: Protonix NUTRITION: N.p.o. MOBILITY: Bedrest GOALS OF CARE: Aggressive care for now FAMILY  DISCUSSIONS: per CVTS DISPOSITION ICU 11/28/2019 pulmonary critical care will sign off at this time   CC time: 39 min   Richardson Landry Larine Fielding ACNP Acute Care Nurse Practitioner [place] Please consult Amion 11/30/2019, 9:47 AM

## 2019-11-30 NOTE — Progress Notes (Signed)
4 Days Post-Op Procedure(s) (LRB): CORONARY ARTERY BYPASS GRAFTING (CABG) using endoscopic greater saphenous vein harvest: svc to OM; svc to Diag; svc to PD; and LIMA to LAD. (N/A) TRANSESOPHAGEAL ECHOCARDIOGRAM (TEE) (N/A) INDOCYANINE GREEN FLUORESCENCE IMAGING (ICG) (N/A) MAZE using Bilateral Pulmonary Vein isolation. (N/A) Clipping Of Atrial Appendage using AtriCure 40 Clip (N/A) Subjective: Sore all over  Objective: Vital signs in last 24 hours: Temp:  [95.2 F (35.1 C)-99 F (37.2 C)] 96.6 F (35.9 C) (12/22 0700) Pulse Rate:  [90-118] 118 (12/22 0700) Cardiac Rhythm: Atrial paced (12/22 0400) Resp:  [13-30] 23 (12/22 0700) BP: (84-127)/(68-88) 121/88 (12/22 0700) SpO2:  [79 %-100 %] 100 % (12/22 0906) Arterial Line BP: (78-148)/(53-75) 124/68 (12/22 0700) FiO2 (%):  [40 %] 40 % (12/21 1553) Weight:  [97.3 kg] 97.3 kg (12/22 0500)  Hemodynamic parameters for last 24 hours: PAP: (27-45)/(13-24) 35/20 CVP:  [5 mmHg-15 mmHg] 5 mmHg PCWP:  [9 mmHg] 9 mmHg CO:  [5 L/min-6.9 L/min] 6 L/min CI:  [2.5 L/min/m2-3.4 L/min/m2] 2.9 L/min/m2  Intake/Output from previous day: 12/21 0701 - 12/22 0700 In: 2624.4 [P.O.:50; I.V.:1685.6; Blood:406; IV Piggyback:223.8] Out: 7385 [Urine:5325; Chest Tube:2060] Intake/Output this shift: Total I/O In: 10.1 [Other:10.1] Out: 670 [Urine:550; Chest Tube:120]  General appearance: alert and cooperative Neurologic: intact Heart: regular rate and rhythm, S1, S2 normal, no murmur, click, rub or gallop Lungs: clear to auscultation bilaterally Abdomen: soft, non-tender; bowel sounds normal; no masses,  no organomegaly Extremities: edema 3+ Wound: dressed, dry  Lab Results: Recent Labs    11/30/19 0013 11/30/19 0407 11/30/19 0453  WBC 7.3 6.8  --   HGB 9.3* 8.9* 8.2*  HCT 27.2* 26.4* 24.0*  PLT 71* 71*  --    BMET:  Recent Labs    11/29/19 1702 11/30/19 0407 11/30/19 0453  NA 136 134* 135  K 3.6 3.1* 2.9*  CL 101 99  --   CO2 26  26  --   GLUCOSE 151* 130*  --   BUN 11 8  --   CREATININE 0.98 0.74  --   CALCIUM 7.7* 7.5*  --     PT/INR: No results for input(s): LABPROT, INR in the last 72 hours. ABG    Component Value Date/Time   PHART 7.479 (H) 11/30/2019 0453   HCO3 29.8 (H) 11/30/2019 0453   TCO2 31 11/30/2019 0453   ACIDBASEDEF 6.0 (H) 11/24/2019 1718   O2SAT 98.0 11/30/2019 0453   CBG (last 3)  Recent Labs    11/29/19 2334 11/30/19 0355 11/30/19 0737  GLUCAP 116* 125* 125*    Assessment/Plan: S/P Procedure(s) (LRB): CORONARY ARTERY BYPASS GRAFTING (CABG) using endoscopic greater saphenous vein harvest: svc to OM; svc to Diag; svc to PD; and LIMA to LAD. (N/A) TRANSESOPHAGEAL ECHOCARDIOGRAM (TEE) (N/A) INDOCYANINE GREEN FLUORESCENCE IMAGING (ICG) (N/A) MAZE using Bilateral Pulmonary Vein isolation. (N/A) Clipping Of Atrial Appendage using AtriCure 40 Clip (N/A) Mobilize Diuresis replace electrolytes     LOS: 10 days    Wonda Olds 11/30/2019

## 2019-12-01 LAB — BASIC METABOLIC PANEL
Anion gap: 6 (ref 5–15)
Anion gap: 8 (ref 5–15)
Anion gap: 9 (ref 5–15)
BUN: 7 mg/dL (ref 6–20)
BUN: 5 mg/dL — ABNORMAL LOW (ref 6–20)
BUN: 6 mg/dL (ref 6–20)
CO2: 27 mmol/L (ref 22–32)
CO2: 27 mmol/L (ref 22–32)
CO2: 26 mmol/L (ref 22–32)
Calcium: 7.1 mg/dL — ABNORMAL LOW (ref 8.9–10.3)
Calcium: 7.4 mg/dL — ABNORMAL LOW (ref 8.9–10.3)
Calcium: 7.6 mg/dL — ABNORMAL LOW (ref 8.9–10.3)
Chloride: 99 mmol/L (ref 98–111)
Chloride: 101 mmol/L (ref 98–111)
Chloride: 98 mmol/L (ref 98–111)
Creatinine, Ser: 0.64 mg/dL (ref 0.61–1.24)
Creatinine, Ser: 0.58 mg/dL — ABNORMAL LOW (ref 0.61–1.24)
Creatinine, Ser: 0.76 mg/dL (ref 0.61–1.24)
GFR calc Af Amer: >60 mL/min (ref >60)
GFR calc Af Amer: >60 mL/min (ref >60)
GFR calc Af Amer: >60 mL/min (ref >60)
GFR calc non Af Amer: >60 mL/min (ref >60)
GFR calc non Af Amer: >60 mL/min (ref >60)
GFR calc non Af Amer: >60 mL/min (ref >60)
Glucose, Bld: 148 mg/dL — ABNORMAL HIGH (ref 70–99)
Glucose, Bld: 133 mg/dL — ABNORMAL HIGH (ref 70–99)
Glucose, Bld: 188 mg/dL — ABNORMAL HIGH (ref 70–99)
Potassium: 3.1 mmol/L — ABNORMAL LOW (ref 3.5–5.1)
Potassium: 3.3 mmol/L — ABNORMAL LOW (ref 3.5–5.1)
Potassium: 3.5 mmol/L (ref 3.5–5.1)
Sodium: 132 mmol/L — ABNORMAL LOW (ref 135–145)
Sodium: 136 mmol/L (ref 135–145)
Sodium: 133 mmol/L — ABNORMAL LOW (ref 135–145)

## 2019-12-01 LAB — BPAM RBC
Blood Product Expiration Date: 202101152359
Blood Product Expiration Date: 202101202359
Blood Product Expiration Date: 202101202359
Blood Product Expiration Date: 202101202359
Blood Product Expiration Date: 202101202359
ISSUE DATE / TIME: 202012201333
ISSUE DATE / TIME: 202012201627
ISSUE DATE / TIME: 202012211000
ISSUE DATE / TIME: 202012212138
ISSUE DATE / TIME: 202012220957
Unit Type and Rh: 7300
Unit Type and Rh: 7300
Unit Type and Rh: 7300
Unit Type and Rh: 7300
Unit Type and Rh: 7300

## 2019-12-01 LAB — POCT I-STAT 7, (LYTES, BLD GAS, ICA,H+H)
Acid-Base Excess: 5 mmol/L — ABNORMAL HIGH (ref 0.0–2.0)
Acid-Base Excess: 6 mmol/L — ABNORMAL HIGH (ref 0.0–2.0)
Bicarbonate: 28.6 mmol/L — ABNORMAL HIGH (ref 20.0–28.0)
Bicarbonate: 29.2 mmol/L — ABNORMAL HIGH (ref 20.0–28.0)
Calcium, Ion: 1.12 mmol/L — ABNORMAL LOW (ref 1.15–1.40)
Calcium, Ion: 1.1 mmol/L — ABNORMAL LOW (ref 1.15–1.40)
HCT: 26 % — ABNORMAL LOW (ref 39.0–52.0)
HCT: 29 % — ABNORMAL LOW (ref 39.0–52.0)
Hemoglobin: 8.8 g/dL — ABNORMAL LOW (ref 13.0–17.0)
Hemoglobin: 9.9 g/dL — ABNORMAL LOW (ref 13.0–17.0)
O2 Saturation: 96 %
O2 Saturation: 97 %
Patient temperature: 36.2
Patient temperature: 37
Potassium: 3.1 mmol/L — ABNORMAL LOW (ref 3.5–5.1)
Potassium: 3.5 mmol/L (ref 3.5–5.1)
Sodium: 134 mmol/L — ABNORMAL LOW (ref 135–145)
Sodium: 133 mmol/L — ABNORMAL LOW (ref 135–145)
TCO2: 30 mmol/L (ref 22–32)
TCO2: 30 mmol/L (ref 22–32)
pCO2 arterial: 33.6 mmHg (ref 32.0–48.0)
pCO2 arterial: 34.9 mmHg (ref 32.0–48.0)
pH, Arterial: 7.535 — ABNORMAL HIGH (ref 7.350–7.450)
pH, Arterial: 7.53 — ABNORMAL HIGH (ref 7.350–7.450)
pO2, Arterial: 67 mmHg — ABNORMAL LOW (ref 83.0–108.0)
pO2, Arterial: 83 mmHg (ref 83.0–108.0)

## 2019-12-01 LAB — CBC
HCT: 28.2 % — ABNORMAL LOW (ref 39.0–52.0)
Hemoglobin: 9.5 g/dL — ABNORMAL LOW (ref 13.0–17.0)
MCH: 28.8 pg (ref 26.0–34.0)
MCHC: 33.7 g/dL (ref 30.0–36.0)
MCV: 85.5 fL (ref 80.0–100.0)
Platelets: 104 10*3/uL — ABNORMAL LOW (ref 150–400)
RBC: 3.3 MIL/uL — ABNORMAL LOW (ref 4.22–5.81)
RDW: 14.7 % (ref 11.5–15.5)
WBC: 8 10*3/uL (ref 4.0–10.5)
nRBC: 0 % (ref 0.0–0.2)

## 2019-12-01 LAB — COOXEMETRY PANEL
Carboxyhemoglobin: 1.6 % — ABNORMAL HIGH (ref 0.5–1.5)
Carboxyhemoglobin: 2.1 % — ABNORMAL HIGH (ref 0.5–1.5)
Methemoglobin: 0.6 % (ref 0.0–1.5)
Methemoglobin: 1 % (ref 0.0–1.5)
O2 Saturation: 59.3 %
O2 Saturation: 59.8 %
Total hemoglobin: 11.1 g/dL — ABNORMAL LOW (ref 12.0–16.0)
Total hemoglobin: 10.6 g/dL — ABNORMAL LOW (ref 12.0–16.0)

## 2019-12-01 LAB — TYPE AND SCREEN
ABO/RH(D): B POS
Antibody Screen: NEGATIVE
Unit division: 0
Unit division: 0
Unit division: 0
Unit division: 0
Unit division: 0

## 2019-12-01 LAB — GLUCOSE, CAPILLARY
Glucose-Capillary: 131 mg/dL — ABNORMAL HIGH (ref 70–99)
Glucose-Capillary: 145 mg/dL — ABNORMAL HIGH (ref 70–99)
Glucose-Capillary: 148 mg/dL — ABNORMAL HIGH (ref 70–99)
Glucose-Capillary: 194 mg/dL — ABNORMAL HIGH (ref 70–99)
Glucose-Capillary: 194 mg/dL — ABNORMAL HIGH (ref 70–99)
Glucose-Capillary: 223 mg/dL — ABNORMAL HIGH (ref 70–99)

## 2019-12-01 LAB — LACTATE DEHYDROGENASE: LDH: 256 U/L — ABNORMAL HIGH (ref 98–192)

## 2019-12-01 LAB — HEPARIN LEVEL (UNFRACTIONATED): Heparin Unfractionated: <0.1 IU/mL — ABNORMAL LOW (ref 0.30–0.70)

## 2019-12-01 LAB — POCT ACTIVATED CLOTTING TIME
Activated Clotting Time: 136 seconds
Activated Clotting Time: 136 seconds
Activated Clotting Time: 136 seconds
Activated Clotting Time: 136 seconds
Activated Clotting Time: 131 seconds

## 2019-12-01 LAB — MAGNESIUM: Magnesium: 1.8 mg/dL (ref 1.7–2.4)

## 2019-12-01 MED ORDER — SPIRONOLACTONE 12.5 MG HALF TABLET
12.5000 mg | ORAL_TABLET | Freq: Every day | ORAL | Status: DC
  Administered 2019-12-01 – 2019-12-02 (×2): 12.5 mg via ORAL
  Filled 2019-12-01 (×2): qty 1

## 2019-12-01 MED ORDER — MAGNESIUM SULFATE 2 GM/50ML IV SOLN
2.0000 g | Freq: Once | INTRAVENOUS | Status: AC
  Administered 2019-12-01: 08:00:00 2 g via INTRAVENOUS
  Filled 2019-12-01: qty 50

## 2019-12-01 MED ORDER — POTASSIUM CHLORIDE 20 MEQ PO PACK
40.0000 meq | PACK | Freq: Three times a day (TID) | ORAL | Status: AC
  Administered 2019-12-01: 40 meq via ORAL
  Filled 2019-12-01 (×2): qty 2

## 2019-12-01 MED ORDER — POTASSIUM CHLORIDE 10 MEQ/50ML IV SOLN
10.0000 meq | INTRAVENOUS | Status: AC
  Administered 2019-12-01 (×3): 10 meq via INTRAVENOUS
  Filled 2019-12-01: qty 50

## 2019-12-01 MED ORDER — KETOROLAC TROMETHAMINE 15 MG/ML IJ SOLN
15.0000 mg | Freq: Three times a day (TID) | INTRAMUSCULAR | Status: AC
  Administered 2019-12-01 – 2019-12-02 (×5): 15 mg via INTRAVENOUS
  Filled 2019-12-01 (×5): qty 1

## 2019-12-01 MED ORDER — POTASSIUM CHLORIDE 10 MEQ/50ML IV SOLN
10.0000 meq | INTRAVENOUS | Status: AC
  Administered 2019-12-01 (×3): 10 meq via INTRAVENOUS
  Filled 2019-12-01 (×3): qty 50

## 2019-12-01 MED FILL — Dexmedetomidine HCl in NaCl 0.9% IV Soln 400 MCG/100ML: INTRAVENOUS | Qty: 100 | Status: AC

## 2019-12-01 MED FILL — Calcium Chloride Inj 10%: INTRAVENOUS | Qty: 10 | Status: AC

## 2019-12-01 MED FILL — Electrolyte-R (PH 7.4) Solution: INTRAVENOUS | Qty: 4000 | Status: AC

## 2019-12-01 MED FILL — Mannitol IV Soln 20%: INTRAVENOUS | Qty: 500 | Status: AC

## 2019-12-01 MED FILL — Sodium Chloride IV Soln 0.9%: INTRAVENOUS | Qty: 3000 | Status: AC

## 2019-12-01 MED FILL — Magnesium Sulfate Inj 50%: INTRAMUSCULAR | Qty: 10 | Status: AC

## 2019-12-01 MED FILL — Heparin Sodium (Porcine) Inj 1000 Unit/ML: INTRAMUSCULAR | Qty: 30 | Status: AC

## 2019-12-01 MED FILL — Sodium Bicarbonate IV Soln 8.4%: INTRAVENOUS | Qty: 50 | Status: AC

## 2019-12-01 MED FILL — Potassium Chloride Inj 2 mEq/ML: INTRAVENOUS | Qty: 40 | Status: AC

## 2019-12-01 MED FILL — Cefuroxime Sodium For Inj 750 MG: INTRAMUSCULAR | Qty: 750 | Status: AC

## 2019-12-01 MED FILL — Heparin Sodium (Porcine) Inj 1000 Unit/ML: INTRAMUSCULAR | Qty: 20 | Status: AC

## 2019-12-01 NOTE — Progress Notes (Signed)
EVENING ROUNDS NOTE :     [address]       [place] [address]             [phone]                 5 Days Post-Op Procedure(s) (LRB): CORONARY ARTERY BYPASS GRAFTING (CABG) using endoscopic greater saphenous vein harvest: svc to OM; svc to Diag; svc to PD; and LIMA to LAD. (N/A) TRANSESOPHAGEAL ECHOCARDIOGRAM (TEE) (N/A) INDOCYANINE GREEN FLUORESCENCE IMAGING (ICG) (N/A) MAZE using Bilateral Pulmonary Vein isolation. (N/A) Clipping Of Atrial Appendage using AtriCure 40 Clip (N/A)   Total Length of Stay:  LOS: 11 days  Events:  No events Good impella flows    BP 105/76   Pulse 82   Temp 98.6 F (37 C)   Resp (!) 24   Ht 5\' 10"  (1.778 m)   Wt 91.7 kg   SpO2 100%   BMI 29.01 kg/m   PAP: (24-40)/(11-24) 33/19 CVP:  [4 mmHg-9 mmHg] 4 mmHg CO:  [4.9 L/min-5.3 L/min] 5.2 L/min CI:  [2.4 L/min/m2-2.6 L/min/m2] 2.5 L/min/m2  FiO2 (%):  [36 %] 36 %  . sodium chloride Stopped (12/01/19 1805)  . sodium chloride    . sodium chloride 20 mL/hr at 11/26/19 1445  . amiodarone 30 mg/hr (12/01/19 1805)  . dextrose 5 % and 0.45% NaCl Stopped (11/29/19 1331)  . DOPamine Stopped (12/01/19 0850)  . epinephrine Stopped (11/29/19 1252)  . furosemide (LASIX) infusion 4 mg/hr (12/01/19 1914)  . impella catheter heparin 50 unit/mL in dextrose 5%    . heparin 500 Units/hr (12/01/19 1805)  . lactated ringers    . lactated ringers Stopped (11/29/19 1353)  . milrinone 0.25 mcg/kg/min (12/01/19 1805)  . norepinephrine (LEVOPHED) Adult infusion 4 mcg/min (12/01/19 1805)  . potassium chloride 10 mEq (12/01/19 1913)    I/O last 3 completed shifts: In: 4403 [P.O.:920; I.V.:1534.8; Blood:312.2; Other:369.7; IV [id number]:875.6] Out: 4332 [Urine:4100; Chest Tube:2540]   CBC Latest Ref Rng & Units 12/01/2019 12/01/2019 12/01/2019  WBC 4.0 - 10.5 K/uL - - 8.0  Hemoglobin 13.0 - 17.0 g/dL 9.9(L) 8.8(L) 9.5(L)  Hematocrit 39.0 - 52.0 % 29.0(L) 26.0(L) 28.2(L)  Platelets  150 - 400 K/uL - - 104(L)    BMP Latest Ref Rng & Units 12/01/2019 12/01/2019 12/01/2019  Glucose 70 - 99 mg/dL - 188(H) 133(H)  BUN 6 - 20 mg/dL - 6 5(L)  Creatinine 0.61 - 1.24 mg/dL - 0.76 0.58(L)  Sodium 135 - 145 mmol/L 133(L) 133(L) 136  Potassium 3.5 - 5.1 mmol/L 3.5 3.5 3.3(L)  Chloride 98 - 111 mmol/L - 98 101  CO2 22 - 32 mmol/L - 26 27  Calcium 8.9 - 10.3 mg/dL - 7.6(L) 7.4(L)    ABG    Component Value Date/Time   PHART 7.530 (H) 12/01/2019 1649   PCO2ART 34.9 12/01/2019 1649   PO2ART 83.0 12/01/2019 1649   HCO3 29.2 (H) 12/01/2019 1649   TCO2 30 12/01/2019 1649   ACIDBASEDEF 6.0 (H) 11/24/2019 1718   O2SAT 59.8 12/01/2019 1650       Melodie Bouillon, MD 12/01/2019 7:45 PM

## 2019-12-01 NOTE — Progress Notes (Addendum)
Patient ID: Kevin Underwood, male   DOB: 08-Jul-1961, 58 y.o.   MRN: [id number]     Advanced Heart Failure Rounding Note  PCP-Cardiologist: Nona Dell, MD   Subjective:    - Impella 5.5 placed 12/16 due to cardiogenic shock.  - CABG x 4 + Maze 12/18  On  Impella P 6 with good waveforms 3.5 liters.   Remains on NE 4 mcg + milrinone 0.25 mcg + lasix drip 4 mg/hour + amio drip 30 mg. Dopamine off  Feeling better today. Breathing easier. Brisk diuresis noted. Weight down another 12 pounds  Denies SOB.      [place] numbers: CVP 5 PA 35/21 Thermo 5.1/2.5 Co-ox 59%  Impella P-6 3.4L flow. Waveforms ok   Objective:   Weight Range: 91.7 kg Body mass index is 29.01 kg/m.   Vital Signs:   Temp:  [96.8 F (36 C)-98.2 F (36.8 C)] 97.9 F (36.6 C) (12/23 1100) Pulse Rate:  [73-87] 82 (12/23 0928) Resp:  [9-29] 12 (12/23 1100) BP: (99-129)/(58-88) 119/58 (12/23 0928) SpO2:  [90 %-100 %] 100 % (12/23 1000) Arterial Line BP: (101-144)/(45-67) 120/61 (12/23 1100) FiO2 (%):  [36 %] 36 % (12/23 0933) Weight:  [91.7 kg] 91.7 kg (12/23 0500) Last BM Date: 11/23/19  Weight change: Filed Weights   11/29/19 0500 11/30/19 0500 12/01/19 0500  Weight: 98.4 kg 97.3 kg 91.7 kg    Intake/Output:   Intake/Output Summary (Last 24 hours) at 12/01/2019 1136 Last data filed at 12/01/2019 1100 Gross per 24 hour  Intake 2884.04 ml  Output 4575 ml  Net -1690.96 ml      Physical Exam   General:  In bed.  No resp difficulty HEENT: normal Neck: supple. JVP 6-7. Carotids 2+ bilat; no bruits. No lymphadenopathy or thryomegaly appreciated. Cor: PMI nondisplaced. Regular rate & rhythm. No rubs, gallops or murmurs. R axillary impella. Sternal incision approximated.  Lungs: clear on 4 liters oxygen Abdomen: soft, nontender, nondistended. No hepatosplenomegaly. No bruits or masses. Good bowel sounds. Extremities: no cyanosis, clubbing, rash, R and LLE 1+ edema Neuro: alert & orientedx3,  cranial nerves grossly intact. moves all 4 extremities w/o difficulty. Affect pleasant  Telemetry   NSR 80-90s. Personally reviewed.   Labs    CBC Recent Labs    11/30/19 1322 12/01/19 0410 12/01/19 0501  WBC 6.8 8.0  --   HGB 9.6* 9.5* 8.8*  HCT 28.0* 28.2* 26.0*  MCV 84.8 85.5  --   PLT 76* 104*  --    Basic Metabolic Panel Recent Labs    44/62/86 0407 11/30/19 0453 12/01/19 0202 12/01/19 0501 12/01/19 0818  NA 134*  --  132* 134* 136  K 3.1*  --  3.1* 3.1* 3.3*  CL 99   < > 99  --  101  CO2 26   < > 27  --  27  GLUCOSE 130*   < > 148*  --  133*  BUN 8   < > 7  --  5*  CREATININE 0.74   < > 0.64  --  0.58*  CALCIUM 7.5*   < > 7.1*  --  7.4*  MG 1.6*  --  1.8  --   --    < > = values in this interval not displayed.   Liver Function Tests No results for input(s): AST, ALT, ALKPHOS, BILITOT, PROT, ALBUMIN in the last 72 hours. No results for input(s): LIPASE, AMYLASE in the last 72 hours. Cardiac Enzymes No results for input(s):  CKTOTAL, CKMB, CKMBINDEX, TROPONINI in the last 72 hours.  BNP: BNP (last 3 results) Recent Labs    11/22/19 0414 11/23/19 0453 11/25/19 0500  BNP 578.0* 1,054.0* 596.1*    ProBNP (last 3 results) No results for input(s): PROBNP in the last 8760 hours.   D-Dimer No results for input(s): DDIMER in the last 72 hours. Hemoglobin A1C No results for input(s): HGBA1C in the last 72 hours. Fasting Lipid Panel Recent Labs    11/29/19 1702  TRIG 90   Thyroid Function Tests No results for input(s): TSH, T4TOTAL, T3FREE, THYROIDAB in the last 72 hours.  Invalid input(s): FREET3  Other results:   Imaging    No results found.   Medications:     Scheduled Medications: . sodium chloride   Intravenous Once  . acetaminophen  650 mg Oral Q6H  . arformoterol  15 mcg Nebulization BID  . aspirin  81 mg Oral Daily  . budesonide (PULMICORT) nebulizer solution  0.5 mg Nebulization BID  . Chlorhexidine Gluconate Cloth  6 each  Topical Daily  . clopidogrel  75 mg Oral Daily  . colchicine  0.3 mg Oral BID  . feeding supplement (ENSURE ENLIVE)  237 mL Oral TID BM  . insulin aspart  0-20 Units Subcutaneous Q4H  . ipratropium  0.5 mg Nebulization Q6H  . mouth rinse  15 mL Mouth Rinse BID  . multivitamin with minerals  1 tablet Oral Daily  . pantoprazole (PROTONIX) IV  40 mg Intravenous QHS  . potassium chloride  40 mEq Oral TID  . rosuvastatin  40 mg Oral q1800  . senna-docusate  1 tablet Oral Daily  . sodium chloride flush  3 mL Intravenous Q12H  . spironolactone  12.5 mg Oral Daily    Infusions: . sodium chloride 10 mL/hr at 12/01/19 1100  . sodium chloride    . sodium chloride 20 mL/hr at 11/26/19 1445  . amiodarone 30 mg/hr (12/01/19 1100)  . dextrose 5 % and 0.45% NaCl Stopped (11/29/19 1331)  . DOPamine Stopped (12/01/19 0850)  . epinephrine Stopped (11/29/19 1252)  . furosemide (LASIX) infusion 4 mg/hr (12/01/19 1100)  . impella catheter heparin 50 unit/mL in dextrose 5%    . heparin 500 Units/hr (12/01/19 1100)  . lactated ringers    . lactated ringers Stopped (11/29/19 1353)  . milrinone 0.25 mcg/kg/min (12/01/19 1100)  . norepinephrine (LEVOPHED) Adult infusion 4 mcg/min (12/01/19 1100)    PRN Medications: sodium chloride, fentaNYL, levalbuterol, metoprolol tartrate, morphine injection, ondansetron (ZOFRAN) IV, oxyCODONE, sodium chloride flush, traMADol    Assessment/Plan   1. Acute systolic CHF => cardiogenic shock:  Echo showed EF < 20% with mildly decreased RV systolic function. He was admitted with afib/RVR and CHF.  Cath with elevated filing pressures, low CO (CI 1.4 by RHC), and severe 3 vessel coronary disease.  Ischemic cardiomyopathy, possibly contribution from tachycardia with previous atrial fibrillation. Impella 5.5 placed due to cardiogenic shock on 12/16.  CABG on 12/18 with Maze.  - Remains on Impella P 6 with good flow.  Flow 3.5 .  - He is off dopamine. Remains on   norepinephrine 4 mcg + milrinone 0.25 Co-ox 60%.  CVP 4 . Continue lasix drip.   - Add 12. 5 mg spiro daily.  - Renal function stable 2. Atrial fibrillation: With RVR at admission, back in NSR now.  Maze with surgery.  - Remains in NSR. On low-dose heparin. hgb stable at 8.8  - Continue amio gtt 3. CAD: Severe  3VD in diabetic smoker.  He has exertional chest pain at baseline.  Was not admitted with ACS. Now s/p CABG.  - No s/s ischemia - Continue ASA 81 daily.  - rosuvastatin 40 daily.    4. ABLA from impella site - Hgb 8.8. Follow daily CBC  - tolarating low-dose heparin. Would not go higher 5. 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  Suspect group 2 + group 3 (COPD).  PA pressures have come down with diuresis.  6. Diabetes: Poor control.  Per IM, would eventually benefit from SGLT2 inhibitor.  7. OSA: Strongly suspected.  8. COPD: Suspected, former smoker.  Emphysema seen on CT chest.  9. ID: Afebrile, WBCs not elevated.  10. Acute hypoxemic respiratory failure: Intubated post-CABG.  Extubated 11/29/19  - continue low-dose iNO 11. Thrombocytopenia: plts 85 -> 81 -> 72k -> 76K -> 104K  12. PVCs/NSVT - continue amio - Keep mg > 2.0 K > 4.0 - watch for suction events 13. Hypokalemia - K 3.3  - add spiro 12.5 daily.    Length of Stay: [place], NP  12/01/2019, 11:36 AM  Advanced Heart Failure Team Pager [phone] (M-F; [place])  Please contact [facility] for night-coverage after hours (4p -7a ) and weekends on [email or web address]  Agree with above.   Feeling better today. Remains on NE 4 milrinone and Impella. Dopa off. Co-ox 59% Weight down another pounds with lasix gtt. Still up about 12-14 pounds. Rhythm stable. Hgb stable K low. PLTs improved.   General:  Sitting up in bed  No resp difficulty HEENT: normal Neck: supple. RIJ swan . Carotids 2+ bilat; no bruits. No lymphadenopathy or thryomegaly appreciated. Cor: impella site ok. Sternal  wound ok  PMI nondisplaced. Regular rate & rhythm. No rubs, gallops or murmurs. Lungs: clear Abdomen: soft, nontender, nondistended. No hepatosplenomegaly. No bruits or masses. Good bowel sounds. Extremities: no cyanosis, clubbing, rash, 1+ edema Neuro: alert & orientedx3, cranial nerves grossly intact. moves all 4 extremities w/o difficulty. Affect pleasant  Hemodynamics improving. Remains on multiple pressors. Impella waveforms ok. At P-6.   Will continue diuresis. Wean NE slowly. D/w Dr. Orvan Seen. Will wean Impella slowly over the weekend with planned extraction on Monday if tolerates wean. Supp K.   CRITICAL CARE Performed by: Glori Bickers  Total critical care time: 35 minutes  Critical care time was exclusive of separately billable procedures and treating other patients.  Critical care was necessary to treat or prevent imminent or life-threatening deterioration.  Critical care was time spent personally by me (independent of midlevel providers or residents) on the following activities: development of treatment plan with patient and/or surrogate as well as nursing, discussions with consultants, evaluation of patient's response to treatment, examination of patient, obtaining history from patient or surrogate, ordering and performing treatments and interventions, ordering and review of laboratory studies, ordering and review of radiographic studies, pulse oximetry and re-evaluation of patient's condition.  Glori Bickers, MD  4:43 PM

## 2019-12-01 NOTE — Progress Notes (Signed)
ANTICOAGULATION CONSULT NOTE  Pharmacy Consult for Heparin Indication: Impella  No Known Allergies  Patient Measurements: Height: 5\' 10"  (177.8 cm) Weight: 202 lb 2.6 oz (91.7 kg) IBW/kg (Calculated) : 73 Heparin Dosing Weight: 85.3 kg  Vital Signs: Temp: 97.9 F (36.6 C) (12/23 0928) BP: 119/58 (12/23 0928) Pulse Rate: 82 (12/23 0928)  Labs: Recent Labs    11/29/19 0324 11/29/19 0559 11/30/19 0407 11/30/19 1322 11/30/19 1322 11/30/19 1640 11/30/19 1705 12/01/19 0202 12/01/19 0354 12/01/19 0410 12/01/19 0501 12/01/19 0818  HGB 8.0*  --  8.9* 9.6*   < > 9.9*  --   --   --  9.5* 8.8*  --   HCT 23.6*  --  26.4* 28.0*  --  29.0*  --   --   --  28.2* 26.0*  --   PLT 72*   < > 71* 76*  --   --   --   --   --  104*  --   --   HEPARINUNFRC <0.10*  --   --   --   --   --   --   --  <0.10*  --   --   --   CREATININE 0.65   < > 0.74 0.70  --   --  0.63 0.64  --   --   --  0.58*   < > = values in this interval not displayed.    Estimated Creatinine Clearance: 114.6 mL/min (A) (by C-G formula based on SCr of 0.58 mg/dL (L)).   Medical History: Past Medical History:  Diagnosis Date  . Arthritis   . COPD (chronic obstructive pulmonary disease) ([place])   . Essential hypertension   . Type 2 diabetes mellitus [facility])     Assessment: 58 year old male admitted with newly documented cardiomyopathy with paroxysmal atrial fibrillation with RVR. Patient had progressively worsening hypoxia and mental status over the course of the day requiring emergent Impella placement in the OR and intubation. Heparin being started post Impella. Patient now s/p CABG 12/18, he continues with Impella.   Heparin has continued through purge, systemic heparin turned off 12/20 d/t drop in hgb now back on at low rate. Hgb down to 8.8 this am, ACTs low as expected. No bleeding issues noted.    Goal of Therapy:  Target ACT: 160-180 seconds Monitor platelets by anticoagulation protocol: Yes   Plan:   Continue systemic heparin at rate no greater than 500 units/hr Follow cbc daily  Erin Hearing PharmD., BCPS Clinical Pharmacist 12/01/2019 11:09 AM

## 2019-12-01 NOTE — Plan of Care (Signed)
  Problem: Clinical Measurements: Goal: Ability to maintain clinical measurements within normal limits will improve Outcome: Progressing Goal: Diagnostic test results will improve Outcome: Progressing Goal: Respiratory complications will improve Outcome: Progressing Note: Patient still c/o SOB, but O2 sats remain stable on nasal cannula.   Problem: Activity: Goal: Risk for activity intolerance will decrease Outcome: Progressing Note: PT consult placed today.   Problem: Nutrition: Goal: Adequate nutrition will be maintained Outcome: Progressing Note: Advancing diet as tolerated; tolerating PO's well.   Problem: Cardiac: Goal: Ability to achieve and maintain adequate cardiopulmonary perfusion will improve Outcome: Progressing   Problem: Clinical Measurements: Goal: Ability to maintain clinical measurements within normal limits will improve Outcome: Progressing Goal: Diagnostic test results will improve Outcome: Progressing Goal: Respiratory complications will improve Outcome: Progressing Note: Patient still c/o SOB, but O2 sats remain stable on nasal cannula.   Problem: Activity: Goal: Risk for activity intolerance will decrease Outcome: Progressing Note: PT consult placed today.   Problem: Nutrition: Goal: Adequate nutrition will be maintained Outcome: Progressing Note: Advancing diet as tolerated; tolerating PO's well.   Problem: Cardiac: Goal: Ability to achieve and maintain adequate cardiopulmonary perfusion will improve Outcome: Progressing

## 2019-12-02 ENCOUNTER — Inpatient Hospital Stay (HOSPITAL_COMMUNITY): Payer: 59

## 2019-12-02 LAB — POCT I-STAT 7, (LYTES, BLD GAS, ICA,H+H)
Acid-Base Excess: 6 mmol/L — ABNORMAL HIGH (ref 0.0–2.0)
Acid-Base Excess: 4 mmol/L — ABNORMAL HIGH (ref 0.0–2.0)
Bicarbonate: 30.1 mmol/L — ABNORMAL HIGH (ref 20.0–28.0)
Bicarbonate: 28.5 mmol/L — ABNORMAL HIGH (ref 20.0–28.0)
Calcium, Ion: 1.17 mmol/L (ref 1.15–1.40)
Calcium, Ion: 1.17 mmol/L (ref 1.15–1.40)
HCT: 28 % — ABNORMAL LOW (ref 39.0–52.0)
HCT: 28 % — ABNORMAL LOW (ref 39.0–52.0)
Hemoglobin: 9.5 g/dL — ABNORMAL LOW (ref 13.0–17.0)
Hemoglobin: 9.5 g/dL — ABNORMAL LOW (ref 13.0–17.0)
O2 Saturation: 98 %
O2 Saturation: 96 %
Patient temperature: 36.1
Patient temperature: 98.4
Potassium: 3.4 mmol/L — ABNORMAL LOW (ref 3.5–5.1)
Potassium: 4.2 mmol/L (ref 3.5–5.1)
Sodium: 134 mmol/L — ABNORMAL LOW (ref 135–145)
Sodium: 133 mmol/L — ABNORMAL LOW (ref 135–145)
TCO2: 31 mmol/L (ref 22–32)
TCO2: 30 mmol/L (ref 22–32)
pCO2 arterial: 39.1 mmHg (ref 32.0–48.0)
pCO2 arterial: 41.4 mmHg (ref 32.0–48.0)
pH, Arterial: 7.491 — ABNORMAL HIGH (ref 7.350–7.450)
pH, Arterial: 7.446 (ref 7.350–7.450)
pO2, Arterial: 96 mmHg (ref 83.0–108.0)
pO2, Arterial: 78 mmHg — ABNORMAL LOW (ref 83.0–108.0)

## 2019-12-02 LAB — COOXEMETRY PANEL
Carboxyhemoglobin: 1.8 % — ABNORMAL HIGH (ref 0.5–1.5)
Carboxyhemoglobin: 1.9 % — ABNORMAL HIGH (ref 0.5–1.5)
Methemoglobin: 0.6 % (ref 0.0–1.5)
Methemoglobin: 0.7 % (ref 0.0–1.5)
O2 Saturation: 57.3 %
O2 Saturation: 61.7 %
Total hemoglobin: 8.5 g/dL — ABNORMAL LOW (ref 12.0–16.0)
Total hemoglobin: 9.6 g/dL — ABNORMAL LOW (ref 12.0–16.0)

## 2019-12-02 LAB — CBC
HCT: 27.9 % — ABNORMAL LOW (ref 39.0–52.0)
HCT: 27.5 % — ABNORMAL LOW (ref 39.0–52.0)
Hemoglobin: 9.3 g/dL — ABNORMAL LOW (ref 13.0–17.0)
Hemoglobin: 9.4 g/dL — ABNORMAL LOW (ref 13.0–17.0)
MCH: 28.9 pg (ref 26.0–34.0)
MCH: 29.1 pg (ref 26.0–34.0)
MCHC: 33.3 g/dL (ref 30.0–36.0)
MCHC: 34.2 g/dL (ref 30.0–36.0)
MCV: 86.6 fL (ref 80.0–100.0)
MCV: 85.1 fL (ref 80.0–100.0)
Platelets: 121 10*3/uL — ABNORMAL LOW (ref 150–400)
Platelets: 129 10*3/uL — ABNORMAL LOW (ref 150–400)
RBC: 3.22 MIL/uL — ABNORMAL LOW (ref 4.22–5.81)
RBC: 3.23 MIL/uL — ABNORMAL LOW (ref 4.22–5.81)
RDW: 14.7 % (ref 11.5–15.5)
RDW: 14.7 % (ref 11.5–15.5)
WBC: 7.4 10*3/uL (ref 4.0–10.5)
WBC: 7.5 10*3/uL (ref 4.0–10.5)
nRBC: 0 % (ref 0.0–0.2)
nRBC: 0 % (ref 0.0–0.2)

## 2019-12-02 LAB — BASIC METABOLIC PANEL
Anion gap: 8 (ref 5–15)
Anion gap: 7 (ref 5–15)
BUN: 9 mg/dL (ref 6–20)
BUN: 10 mg/dL (ref 6–20)
CO2: 26 mmol/L (ref 22–32)
CO2: 28 mmol/L (ref 22–32)
Calcium: 7.3 mg/dL — ABNORMAL LOW (ref 8.9–10.3)
Calcium: 7.6 mg/dL — ABNORMAL LOW (ref 8.9–10.3)
Chloride: 101 mmol/L (ref 98–111)
Chloride: 99 mmol/L (ref 98–111)
Creatinine, Ser: 0.76 mg/dL (ref 0.61–1.24)
Creatinine, Ser: 0.75 mg/dL (ref 0.61–1.24)
GFR calc Af Amer: >60 mL/min (ref >60)
GFR calc Af Amer: >60 mL/min (ref >60)
GFR calc non Af Amer: >60 mL/min (ref >60)
GFR calc non Af Amer: >60 mL/min (ref >60)
Glucose, Bld: 227 mg/dL — ABNORMAL HIGH (ref 70–99)
Glucose, Bld: 124 mg/dL — ABNORMAL HIGH (ref 70–99)
Potassium: 3.6 mmol/L (ref 3.5–5.1)
Potassium: 3.5 mmol/L (ref 3.5–5.1)
Sodium: 135 mmol/L (ref 135–145)
Sodium: 134 mmol/L — ABNORMAL LOW (ref 135–145)

## 2019-12-02 LAB — GLUCOSE, CAPILLARY
Glucose-Capillary: 120 mg/dL — ABNORMAL HIGH (ref 70–99)
Glucose-Capillary: 124 mg/dL — ABNORMAL HIGH (ref 70–99)
Glucose-Capillary: 149 mg/dL — ABNORMAL HIGH (ref 70–99)
Glucose-Capillary: 211 mg/dL — ABNORMAL HIGH (ref 70–99)
Glucose-Capillary: 227 mg/dL — ABNORMAL HIGH (ref 70–99)

## 2019-12-02 LAB — LACTATE DEHYDROGENASE: LDH: 278 U/L — ABNORMAL HIGH (ref 98–192)

## 2019-12-02 LAB — POCT ACTIVATED CLOTTING TIME
Activated Clotting Time: 131 seconds
Activated Clotting Time: 131 seconds
Activated Clotting Time: 136 seconds

## 2019-12-02 LAB — MAGNESIUM: Magnesium: 1.9 mg/dL (ref 1.7–2.4)

## 2019-12-02 LAB — HEPARIN LEVEL (UNFRACTIONATED): Heparin Unfractionated: <0.1 IU/mL — ABNORMAL LOW (ref 0.30–0.70)

## 2019-12-02 MED ORDER — COLCHICINE 0.6 MG PO TABS
0.6000 mg | ORAL_TABLET | Freq: Two times a day (BID) | ORAL | Status: DC
  Administered 2019-12-02 – 2019-12-06 (×9): 0.6 mg via ORAL
  Filled 2019-12-02 (×10): qty 1

## 2019-12-02 MED ORDER — AMIODARONE HCL 200 MG PO TABS
400.0000 mg | ORAL_TABLET | Freq: Every day | ORAL | Status: DC
  Administered 2019-12-02 – 2019-12-08 (×7): 400 mg via ORAL
  Filled 2019-12-02 (×8): qty 2

## 2019-12-02 MED ORDER — SPIRONOLACTONE 25 MG PO TABS
25.0000 mg | ORAL_TABLET | Freq: Every day | ORAL | Status: DC
  Administered 2019-12-03 – 2019-12-08 (×6): 25 mg via ORAL
  Filled 2019-12-02 (×6): qty 1

## 2019-12-02 MED ORDER — POTASSIUM CHLORIDE 10 MEQ/50ML IV SOLN
10.0000 meq | INTRAVENOUS | Status: AC
  Administered 2019-12-02 (×3): 10 meq via INTRAVENOUS
  Filled 2019-12-02: qty 50

## 2019-12-02 MED ORDER — IPRATROPIUM BROMIDE 0.02 % IN SOLN
0.5000 mg | Freq: Two times a day (BID) | RESPIRATORY_TRACT | Status: DC
  Administered 2019-12-02 – 2019-12-08 (×13): 0.5 mg via RESPIRATORY_TRACT
  Filled 2019-12-02 (×13): qty 2.5

## 2019-12-02 MED ORDER — POTASSIUM CHLORIDE CRYS ER 20 MEQ PO TBCR
40.0000 meq | EXTENDED_RELEASE_TABLET | Freq: Two times a day (BID) | ORAL | Status: DC
  Administered 2019-12-02 – 2019-12-08 (×13): 40 meq via ORAL
  Filled 2019-12-02 (×13): qty 2

## 2019-12-02 MED ORDER — AMIODARONE HCL 200 MG PO TABS: 400.0000 mg | ORAL_TABLET | Freq: Every day | ORAL | Status: DC

## 2019-12-02 NOTE — Progress Notes (Signed)
[facility] called unit and spoke with pt's nurse, Denny Peon. She verbalized she has seen the order to discontinue central line and will take care of it.

## 2019-12-02 NOTE — Progress Notes (Signed)
IP rehab admissions - Received consult for CIR.  Awaiting PT/OT evaluations and recommendations.  I met with patient at bedside.  He continues on amio drip, has chest tube in place.  He tells me that he lives with his mom and wife.  He has been married to [place] for 40 years.  I will have my partner follow up on Monday after PT and OT evals are completed.  [phone]

## 2019-12-02 NOTE — Progress Notes (Addendum)
Patient ID: Kevin Underwood, male   DOB: 1960-12-11, 58 y.o.   MRN: [id number]     Advanced Heart Failure Rounding Note  PCP-Cardiologist: Rozann Lesches, MD   Subjective:    - Impella 5.5 placed 12/16 due to cardiogenic shock.  - CABG x 4 + Maze 12/18  Impella turned down to P4. Good waveforms 3.5 liters.   Remains on milrinone 25 mcg.   Feels better. Denies SOB.    CVP 5   Impella P-4  Objective:   Weight Range: 91.5 kg Body mass index is 28.94 kg/m.   Vital Signs:   Temp:  [97 F (36.1 C)-98.8 F (37.1 C)] 98.2 F (36.8 C) (12/24 0915) Pulse Rate:  [89] 89 (12/23 1959) Resp:  [12-34] 16 (12/24 0915) BP: (105)/(76) 105/76 (12/23 1200) SpO2:  [76 %-100 %] 100 % (12/24 0915) Arterial Line BP: (94-153)/(49-75) 132/52 (12/24 0915) Weight:  [91.5 kg] 91.5 kg (12/24 0500) Last BM Date: 11/23/19  Weight change: Filed Weights   11/30/19 0500 12/01/19 0500 12/02/19 0500  Weight: 97.3 kg 91.7 kg 91.5 kg    Intake/Output:   Intake/Output Summary (Last 24 hours) at 12/02/2019 1047 Last data filed at 12/02/2019 1000 Gross per 24 hour  Intake 1812.46 ml  Output 2725 ml  Net -912.54 ml      Physical Exam  CVP 4.  General:  In bed.  No resp difficulty HEENT: normal Neck: supple.. JVP 6-7 . Carotids 2+ bilat; no bruits. No lymphadenopathy or thryomegaly appreciated. Cor: PMI nondisplaced. Regular rate & rhythm. No rubs, gallops or murmurs. R axillary impella. Lungs: clear Abdomen: soft, nontender, nondistended. No hepatosplenomegaly. No bruits or masses. Good bowel sounds. Extremities: no cyanosis, clubbing, rash,  R and LLE 1+ edema Neuro: alert & orientedx3, cranial nerves grossly intact. moves all 4 extremities w/o difficulty. Affect pleasant   Telemetry   NSR 80-90s personally reviewed.   Labs    CBC Recent Labs    12/02/19 0004 12/02/19 0424 12/02/19 0428  WBC 7.4 7.5  --   HGB 9.3* 9.4* 9.5*  HCT 27.9* 27.5* 28.0*  MCV 86.6 85.1  --   PLT  121* 129*  --    Basic Metabolic Panel Recent Labs    12/01/19 0202 12/01/19 0501 12/02/19 0004 12/02/19 0417 12/02/19 0428  NA 132*  --  135 134* 134*  K 3.1*  --  3.6 3.5 3.4*  CL 99   < > 101 99  --   CO2 27   < > 26 28  --   GLUCOSE 148*   < > 227* 124*  --   BUN 7   < > 9 10  --   CREATININE 0.64   < > 0.76 0.75  --   CALCIUM 7.1*   < > 7.3* 7.6*  --   MG 1.8  --   --  1.9  --    < > = values in this interval not displayed.   Liver Function Tests No results for input(s): AST, ALT, ALKPHOS, BILITOT, PROT, ALBUMIN in the last 72 hours. No results for input(s): LIPASE, AMYLASE in the last 72 hours. Cardiac Enzymes No results for input(s): CKTOTAL, CKMB, CKMBINDEX, TROPONINI in the last 72 hours.  BNP: BNP (last 3 results) Recent Labs    11/22/19 0414 11/23/19 0453 11/25/19 0500  BNP 578.0* 1,054.0* 596.1*    ProBNP (last 3 results) No results for input(s): PROBNP in the last 8760 hours.   D-Dimer No results for  input(s): DDIMER in the last 72 hours. Hemoglobin A1C No results for input(s): HGBA1C in the last 72 hours. Fasting Lipid Panel Recent Labs    11/29/19 1702  TRIG 90   Thyroid Function Tests No results for input(s): TSH, T4TOTAL, T3FREE, THYROIDAB in the last 72 hours.  Invalid input(s): FREET3  Other results:   Imaging    DG CHEST PORT 1 VIEW  Result Date: 12/02/2019 CLINICAL DATA:  58 year old male postoperative day 6 status post CABG. EXAM: PORTABLE CHEST 1 VIEW COMPARISON:  11/30/2019 and earlier. FINDINGS: Portable AP semi upright view at 0553 hours. Right subclavian approach intravascular cardiac assist device is stable. Right IJ approach Swan-Ganz catheter tip not significantly changed, distal right main pulmonary artery level. Right chest tube and mediastinal tube remain. The left chest tube has been removed, with no definite left pneumothorax. Stable cardiac size and mediastinal contours. Mildly lower lung volumes. Dense left lower  lobe collapse or consolidation persists. Pulmonary vascularity is stable. Small volume of pleural fluid suspected in the right major minor fissure. IMPRESSION: 1. Left chest tube removed with no definite pneumothorax. 2.  Otherwise stable lines and tubes. 3. Continued left lower lobe collapse or consolidation. Stable pulmonary vascularity since yesterday. Electronically Signed   By: Odessa Fleming M.D.   On: 12/02/2019 08:40     Medications:     Scheduled Medications: . sodium chloride   Intravenous Once  . amiodarone  400 mg Oral Daily  . arformoterol  15 mcg Nebulization BID  . aspirin  81 mg Oral Daily  . budesonide (PULMICORT) nebulizer solution  0.5 mg Nebulization BID  . Chlorhexidine Gluconate Cloth  6 each Topical Daily  . clopidogrel  75 mg Oral Daily  . colchicine  0.6 mg Oral BID  . feeding supplement (ENSURE ENLIVE)  237 mL Oral TID BM  . insulin aspart  0-20 Units Subcutaneous Q4H  . ipratropium  0.5 mg Nebulization BID  . ketorolac  15 mg Intravenous Q8H  . mouth rinse  15 mL Mouth Rinse BID  . multivitamin with minerals  1 tablet Oral Daily  . pantoprazole (PROTONIX) IV  40 mg Intravenous QHS  . potassium chloride  40 mEq Oral BID  . rosuvastatin  40 mg Oral q1800  . senna-docusate  1 tablet Oral Daily  . sodium chloride flush  3 mL Intravenous Q12H  . spironolactone  12.5 mg Oral Daily    Infusions: . sodium chloride Stopped (12/02/19 0957)  . sodium chloride    . sodium chloride 20 mL/hr at 11/26/19 1445  . epinephrine Stopped (11/29/19 1252)  . furosemide (LASIX) infusion 4 mg/hr (12/02/19 1000)  . impella catheter heparin 50 unit/mL in dextrose 5%    . heparin 500 Units/hr (12/02/19 1021)  . lactated ringers    . lactated ringers Stopped (11/29/19 1353)  . milrinone 0.25 mcg/kg/min (12/02/19 1000)  . norepinephrine (LEVOPHED) Adult infusion Stopped (12/02/19 0640)    PRN Medications: sodium chloride, fentaNYL, levalbuterol, metoprolol tartrate, ondansetron  (ZOFRAN) IV, oxyCODONE, sodium chloride flush, traMADol    Assessment/Plan   1. Acute systolic CHF => cardiogenic shock:  Echo showed EF < 20% with mildly decreased RV systolic function. He was admitted with afib/RVR and CHF.  Cath with elevated filing pressures, low CO (CI 1.4 by RHC), and severe 3 vessel coronary disease.  Ischemic cardiomyopathy, possibly contribution from tachycardia with previous atrial fibrillation. Impella 5.5 placed due to cardiogenic shock on 12/16.  CABG on 12/18 with Maze.  - Impella  down to P4 today.   Flow 3.5 .  -Remains on milrinone 0.25 Co-ox 57% .  - Continue 12.5 mg spiro daily.  - Renal function stable 2. Atrial fibrillation: With RVR at admission, back in NSR now.  Maze with surgery.  - Remains in NSR. On low-dose heparin. hgb stable at 9.5  - On amio 400 mg daily.  3. CAD: Severe 3VD in diabetic smoker.  He has exertional chest pain at baseline.  Was not admitted with ACS. Now s/p CABG.  - Continue ASA 81 daily.  - rosuvastatin 40 daily.    4. ABLA from impella site - Hgb 9.5. Follow daily CBC  - tolarating low-dose heparin. Would not go higher 5. 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  Suspect group 2 + group 3 (COPD).  PA pressures have come down with diuresis.  6. Diabetes: Poor control.  Per IM, would eventually benefit from SGLT2 inhibitor.  7. OSA: Strongly suspected.  8. COPD: Suspected, former smoker.  Emphysema seen on CT chest.  9. ID: Afebrile, WBC 7.5  10. Acute hypoxemic respiratory failure: Intubated post-CABG.  Extubated 11/29/19  On 3 liters [place].  11. Thrombocytopenia: plts 85 -> 81 -> 72k -> 76K -> 104K ->129 k  12. PVCs/NSVT - continue amio - Keep mg > 2.0 K > 4.0 - watch for suction events 13. Hypokalemia - K 3.5  - Continue spiro 12.5 daily.   Consult PT/OT. CIR following. OOB today.    Length of Stay: 12  Tonye Becket, NP  12/02/2019, 10:47 AM  Advanced Heart Failure Team Pager [phone]  (M-F; 7a - 4p)  Please contact [facility] for night-coverage after hours (4p -7a ) and weekends on [email or web address]  Agree with above.  He remains on Impella. Now on P-4 with flow 3.5L. Also on milrinone 0.25. Co-ox 57%. CVP only 4-5 but markedly volume overloaded on exam. Weight still up about 15 pounds. Remains in NSR on amio. Hgb stable at 9.5  Exam General:  Sitting up in chair. No resp difficulty HEENT: normal Neck: supple. no JVD. Carotids 2+ bilat; no bruits. No lymphadenopathy or thryomegaly appreciated. Cor: PMI nondisplaced. Regular rate & rhythm. No rubs, gallops or murmurs. Impella site in right chest ok. CT in place. + flank ecchymosis Lungs: clear Abdomen: soft, nontender, nondistended. No hepatosplenomegaly. No bruits or masses. Good bowel sounds. Extremities: no cyanosis, clubbing, rash, 2-3+ edema Neuro: alert & orientedx3, cranial nerves grossly intact. moves all 4 extremities w/o difficulty. Affect pleasant  Remains tenuous but improving. Continue Impella support. Would not wean any further until he is more fully diuresed. Increase lasix to 8/hr. Supp K.   D/w Dr. Vickey Sages.   CRITICAL CARE Performed by: Arvilla Meres  Total critical care time: 35 minutes  Critical care time was exclusive of separately billable procedures and treating other patients.  Critical care was necessary to treat or prevent imminent or life-threatening deterioration.  Critical care was time spent personally by me (independent of midlevel providers or residents) on the following activities: development of treatment plan with patient and/or surrogate as well as nursing, discussions with consultants, evaluation of patient's response to treatment, examination of patient, obtaining history from patient or surrogate, ordering and performing treatments and interventions, ordering and review of laboratory studies, ordering and review of radiographic studies, pulse oximetry and re-evaluation of  patient's condition.  Arvilla Meres, MD  5:49 PM

## 2019-12-02 NOTE — Progress Notes (Signed)
6 Days Post-Op Procedure(s) (LRB): CORONARY ARTERY BYPASS GRAFTING (CABG) using endoscopic greater saphenous vein harvest: svc to OM; svc to Diag; svc to PD; and LIMA to LAD. (N/A) TRANSESOPHAGEAL ECHOCARDIOGRAM (TEE) (N/A) INDOCYANINE GREEN FLUORESCENCE IMAGING (ICG) (N/A) MAZE using Bilateral Pulmonary Vein isolation. (N/A) Clipping Of Atrial Appendage using AtriCure 40 Clip (N/A) Subjective: Right hand coolness; hurting all over  Objective: Vital signs in last 24 hours: Temp:  [97 F (36.1 C)-98.8 F (37.1 C)] 97.7 F (36.5 C) (12/24 0800) Pulse Rate:  [82-89] 89 (12/23 1959) Cardiac Rhythm: Normal sinus rhythm (12/23 2000) Resp:  [9-34] 23 (12/24 0800) BP: (105-119)/(58-76) 105/76 (12/23 1200) SpO2:  [90 %-100 %] 96 % (12/24 0808) Arterial Line BP: (94-153)/(51-75) 129/57 (12/24 0800) FiO2 (%):  [36 %] 36 % (12/23 0933) Weight:  [91.5 kg] 91.5 kg (12/24 0500)  Hemodynamic parameters for last 24 hours: PAP: (24-42)/(11-26) 33/17 CVP:  [4 mmHg-6 mmHg] 6 mmHg CO:  [5 L/min-5.3 L/min] 5 L/min CI:  [2.5 L/min/m2-2.6 L/min/m2] 2.5 L/min/m2  Intake/Output from previous day: 12/23 0701 - 12/24 0700 In: 2004.5 [P.O.:560; I.V.:1023; IV Piggyback:189.7] Out: 3225 [Urine:1655; Chest Tube:1570] Intake/Output this shift: Total I/O In: 11 [Other:11] Out: 150 [Urine:40; Chest Tube:110]  General appearance: alert and appears stated age Neurologic: intact Heart: regular rate and rhythm, S1, S2 normal, no murmur, click, rub or gallop Lungs: clear to auscultation bilaterally Abdomen: soft, non-tender; bowel sounds normal; no masses,  no organomegaly Extremities: edema 1+ Wound: c/d/i  Lab Results: Recent Labs    12/02/19 0004 12/02/19 0424 12/02/19 0428  WBC 7.4 7.5  --   HGB 9.3* 9.4* 9.5*  HCT 27.9* 27.5* 28.0*  PLT 121* 129*  --    BMET:  Recent Labs    12/02/19 0004 12/02/19 0417 12/02/19 0428  NA 135 134* 134*  K 3.6 3.5 3.4*  CL 101 99  --   CO2 26 28  --    GLUCOSE 227* 124*  --   BUN 9 10  --   CREATININE 0.76 0.75  --   CALCIUM 7.3* 7.6*  --     PT/INR: No results for input(s): LABPROT, INR in the last 72 hours. ABG    Component Value Date/Time   PHART 7.491 (H) 12/02/2019 0428   HCO3 30.1 (H) 12/02/2019 0428   TCO2 31 12/02/2019 0428   ACIDBASEDEF 6.0 (H) 11/24/2019 1718   O2SAT 98.0 12/02/2019 0428   CBG (last 3)  Recent Labs    12/01/19 2356 12/02/19 0416 12/02/19 0744  GLUCAP 223* 120* 124*    Assessment/Plan: S/P Procedure(s) (LRB): CORONARY ARTERY BYPASS GRAFTING (CABG) using endoscopic greater saphenous vein harvest: svc to OM; svc to Diag; svc to PD; and LIMA to LAD. (N/A) TRANSESOPHAGEAL ECHOCARDIOGRAM (TEE) (N/A) INDOCYANINE GREEN FLUORESCENCE IMAGING (ICG) (N/A) MAZE using Bilateral Pulmonary Vein isolation. (N/A) Clipping Of Atrial Appendage using AtriCure 40 Clip (N/A) Mobilize Diuresis impella at P4  oob to chair/ambulation D/c PA catheter   LOS: 12 days    Kevin Underwood 12/02/2019

## 2019-12-02 NOTE — Evaluation (Signed)
Physical Therapy Evaluation Patient Details Name: Kevin Underwood MRN: [id number] DOB: 1960-12-10 Today's Date: 12/02/2019   History of Present Illness  58 yo man referred for consideration of CABG. He was in [place] until last week when he presented with SOB and found to be in Afib which lead to heart failure sx. He was transferred to [facility] and underwent LHC today demonstrating severe 3V CAD and severely depressed LV function. Pt underwent impella 5.5 placement on 12/16 and CABG x4 on 12/18.  Clinical Impression  Pt presents to PT with deficits in gait, balance, functional mobility, power, endurance, adherence to sternal precautions. Pt requiring frequent verbal cueing from PT to maintain precautions during session. Pt also requiring intermittent physical assistance during ambulation due to minor LOB. Pt activity tolerance limited during session 2/2 headache. Pt will benefit from continued acute PT services to improve balance and tolerance for OOB activity in hopes of restoring independent mobility.    Follow Up Recommendations CIR;Supervision/Assistance - 24 hour    Equipment Recommendations  None recommended by PT(defer to post-acute setting)    Recommendations for Other Services       Precautions / Restrictions Precautions Precautions: Sternal;Fall Restrictions Weight Bearing Restrictions: No Other Position/Activity Restrictions: sternal and impella precautions      Mobility  Bed Mobility Overal bed mobility: (pt received and left in recliner)                Transfers Overall transfer level: Needs assistance Equipment used: Rolling walker (2 wheeled) Transfers: Sit to/from Stand Sit to Stand: Min assist         General transfer comment: PT providing cues to push from knees with LUE only  Ambulation/Gait Ambulation/Gait assistance: Min assist Gait Distance (Feet): 25 Feet Assistive device: Rolling walker (2 wheeled) Gait Pattern/deviations: Step-to pattern;Wide  base of support Gait velocity: reduced Gait velocity interpretation: <1.31 ft/sec, indicative of household ambulator General Gait Details: pt with short step to gait with reduced foot clearance bilaterally  Stairs            Wheelchair Mobility    Modified Rankin (Stroke Patients Only)       Balance Overall balance assessment: Needs assistance Sitting-balance support: Single extremity supported;Feet supported Sitting balance-Leahy Scale: Good Sitting balance - Comments: supervision   Standing balance support: Bilateral upper extremity supported Standing balance-Leahy Scale: Fair Standing balance comment: minG-minA                             Pertinent Vitals/Pain Pain Assessment: 0-10 Pain Score: 9  Pain Location: head Pain Descriptors / Indicators: Aching Pain Intervention(s): Patient requesting pain meds-RN notified    Home Living Family/patient expects to be discharged to:: Private residence Living Arrangements: Spouse/significant other;Parent Available Help at Discharge: Family;Available 24 hours/day Type of Home: House Home Access: Stairs to enter;Ramped entrance   Entrance Stairs-Number of Steps: 3 steps and has a ramp Home Layout: One level Home Equipment: Walker - 2 wheels      Prior Function Level of Independence: Independent               Hand Dominance   Dominant Hand: Right    Extremity/Trunk Assessment   Upper Extremity Assessment Upper Extremity Assessment: Generalized weakness(limited assessment 2/2 sternal and impella precautions)    Lower Extremity Assessment Lower Extremity Assessment: Overall WFL for tasks assessed    Cervical / Trunk Assessment Cervical / Trunk Assessment: Kyphotic  Communication   Communication: No  difficulties  Cognition Arousal/Alertness: Awake/alert Behavior During Therapy: WFL for tasks assessed/performed Overall Cognitive Status: Within Functional Limits for tasks assessed                                         General Comments General comments (skin integrity, edema, etc.): VSS on RA, RN and nurse tech assist in managing extensive lines and chair follow    Exercises     Assessment/Plan    PT Assessment Patient needs continued PT services  PT Problem List Decreased strength;Decreased activity tolerance;Decreased balance;Decreased mobility;Decreased knowledge of use of DME;Decreased safety awareness;Decreased knowledge of precautions;Cardiopulmonary status limiting activity;Pain       PT Treatment Interventions DME instruction;Gait training;Stair training;Functional mobility training;Therapeutic activities;Therapeutic exercise;Balance training;Neuromuscular re-education;Patient/family education    PT Goals (Current goals can be found in the Care Plan section)  Acute Rehab PT Goals Patient Stated Goal: To return to independent mobility PT Goal Formulation: With patient Time For Goal Achievement: 12/16/19 Potential to Achieve Goals: Good Additional Goals Additional Goal #1: Pt will verbalize sternal precautions and implement them during mobility with supervision.    Frequency Min 3X/week   Barriers to discharge        Co-evaluation               AM-PAC PT "6 Clicks" Mobility  Outcome Measure Help needed turning from your back to your side while in a flat bed without using bedrails?: A Lot Help needed moving from lying on your back to sitting on the side of a flat bed without using bedrails?: A Lot Help needed moving to and from a bed to a chair (including a wheelchair)?: A Little Help needed standing up from a chair using your arms (e.g., wheelchair or bedside chair)?: A Little Help needed to walk in hospital room?: A Little Help needed climbing 3-5 steps with a railing? : A Lot 6 Click Score: 15    End of Session Equipment Utilized During Treatment: (none) Activity Tolerance: Patient tolerated treatment well Patient left: in  chair;with call bell/phone within reach Nurse Communication: Mobility status PT Visit Diagnosis: Unsteadiness on feet (R26.81)    Time: 9892-1194 PT Time Calculation (min) (ACUTE ONLY): 26 min   Charges:   PT Evaluation $PT Eval High Complexity: 1 High PT Treatments $Gait Training: 8-22 mins        Arlyss Gandy, PT, DPT Acute Rehabilitation Pager: [phone]   Arlyss Gandy 12/02/2019, 2:55 PM

## 2019-12-03 ENCOUNTER — Inpatient Hospital Stay (HOSPITAL_COMMUNITY): Payer: 59

## 2019-12-03 LAB — CBC
HCT: 29.1 % — ABNORMAL LOW (ref 39.0–52.0)
Hemoglobin: 9.6 g/dL — ABNORMAL LOW (ref 13.0–17.0)
MCH: 29.2 pg (ref 26.0–34.0)
MCHC: 33 g/dL (ref 30.0–36.0)
MCV: 88.4 fL (ref 80.0–100.0)
Platelets: 162 10*3/uL (ref 150–400)
RBC: 3.29 MIL/uL — ABNORMAL LOW (ref 4.22–5.81)
RDW: 14.7 % (ref 11.5–15.5)
WBC: 8.4 10*3/uL (ref 4.0–10.5)
nRBC: 0 % (ref 0.0–0.2)

## 2019-12-03 LAB — GLUCOSE, CAPILLARY
Glucose-Capillary: 111 mg/dL — ABNORMAL HIGH (ref 70–99)
Glucose-Capillary: 115 mg/dL — ABNORMAL HIGH (ref 70–99)
Glucose-Capillary: 148 mg/dL — ABNORMAL HIGH (ref 70–99)
Glucose-Capillary: 161 mg/dL — ABNORMAL HIGH (ref 70–99)
Glucose-Capillary: 170 mg/dL — ABNORMAL HIGH (ref 70–99)
Glucose-Capillary: 177 mg/dL — ABNORMAL HIGH (ref 70–99)

## 2019-12-03 LAB — MAGNESIUM: Magnesium: 1.7 mg/dL (ref 1.7–2.4)

## 2019-12-03 LAB — BASIC METABOLIC PANEL
Anion gap: 8 (ref 5–15)
BUN: 10 mg/dL (ref 6–20)
CO2: 29 mmol/L (ref 22–32)
Calcium: 7.9 mg/dL — ABNORMAL LOW (ref 8.9–10.3)
Chloride: 99 mmol/L (ref 98–111)
Creatinine, Ser: 0.65 mg/dL (ref 0.61–1.24)
GFR calc Af Amer: >60 mL/min (ref >60)
GFR calc non Af Amer: >60 mL/min (ref >60)
Glucose, Bld: 146 mg/dL — ABNORMAL HIGH (ref 70–99)
Potassium: 4.1 mmol/L (ref 3.5–5.1)
Sodium: 136 mmol/L (ref 135–145)

## 2019-12-03 LAB — POCT ACTIVATED CLOTTING TIME
Activated Clotting Time: 136 seconds
Activated Clotting Time: 137 seconds
Activated Clotting Time: 180 seconds

## 2019-12-03 LAB — COOXEMETRY PANEL
Carboxyhemoglobin: 2.9 % — ABNORMAL HIGH (ref 0.5–1.5)
Carboxyhemoglobin: 2.4 % — ABNORMAL HIGH (ref 0.5–1.5)
Methemoglobin: 1 % (ref 0.0–1.5)
Methemoglobin: 0.6 % (ref 0.0–1.5)
O2 Saturation: 61.7 %
O2 Saturation: 45.6 %
Total hemoglobin: 9.7 g/dL — ABNORMAL LOW (ref 12.0–16.0)
Total hemoglobin: 10.2 g/dL — ABNORMAL LOW (ref 12.0–16.0)

## 2019-12-03 LAB — HEPARIN LEVEL (UNFRACTIONATED): Heparin Unfractionated: <0.1 IU/mL — ABNORMAL LOW (ref 0.30–0.70)

## 2019-12-03 MED ORDER — SODIUM CHLORIDE 0.9% FLUSH
10.0000 mL | Freq: Two times a day (BID) | INTRAVENOUS | Status: DC
  Administered 2019-12-04 – 2019-12-05 (×3): 10 mL

## 2019-12-03 MED ORDER — FENTANYL CITRATE (PF) 100 MCG/2ML IJ SOLN
50.0000 ug | Freq: Once | INTRAMUSCULAR | Status: AC
  Administered 2019-12-03: 50 ug via INTRAVENOUS
  Filled 2019-12-03: qty 2

## 2019-12-03 MED ORDER — KETOROLAC TROMETHAMINE 15 MG/ML IJ SOLN
15.0000 mg | Freq: Four times a day (QID) | INTRAMUSCULAR | Status: AC
  Administered 2019-12-03 – 2019-12-04 (×4): 15 mg via INTRAVENOUS
  Filled 2019-12-03 (×4): qty 1

## 2019-12-03 MED ORDER — SORBITOL 70 % SOLN
15.0000 mL | Freq: Every day | Status: DC | PRN
  Administered 2019-12-03 (×2): 15 mL via ORAL
  Filled 2019-12-03 (×2): qty 30

## 2019-12-03 MED ORDER — CARVEDILOL 3.125 MG PO TABS
3.1250 mg | ORAL_TABLET | Freq: Two times a day (BID) | ORAL | Status: DC
  Administered 2019-12-03 – 2019-12-06 (×7): 3.125 mg via ORAL
  Filled 2019-12-03 (×7): qty 1

## 2019-12-03 MED ORDER — SODIUM CHLORIDE 0.9% FLUSH: 10.0000 mL | INTRAVENOUS | Status: DC | PRN

## 2019-12-03 MED ORDER — METOLAZONE 2.5 MG PO TABS
2.5000 mg | ORAL_TABLET | Freq: Once | ORAL | Status: AC
  Administered 2019-12-03: 12:00:00 2.5 mg via ORAL
  Filled 2019-12-03: qty 1

## 2019-12-03 MED ORDER — MAGNESIUM SULFATE 2 GM/50ML IV SOLN
2.0000 g | Freq: Once | INTRAVENOUS | Status: AC
  Administered 2019-12-03: 2 g via INTRAVENOUS
  Filled 2019-12-03: qty 50

## 2019-12-03 MED ORDER — POTASSIUM CHLORIDE CRYS ER 20 MEQ PO TBCR
20.0000 meq | EXTENDED_RELEASE_TABLET | Freq: Once | ORAL | Status: AC
  Administered 2019-12-03: 20 meq via ORAL
  Filled 2019-12-03: qty 1

## 2019-12-03 MED ORDER — BISACODYL 10 MG RE SUPP
10.0000 mg | Freq: Every day | RECTAL | Status: DC | PRN
  Administered 2019-12-03 – 2019-12-05 (×2): 10 mg via RECTAL
  Filled 2019-12-03 (×2): qty 1

## 2019-12-03 NOTE — Anesthesia Preprocedure Evaluation (Addendum)
Anesthesia Evaluation  Patient identified by MRN, date of birth, ID band Patient awake    Reviewed: Allergy & Precautions, NPO status , Patient's Chart, lab work & pertinent test results  History of Anesthesia Complications Negative for: history of anesthetic complications  Airway Mallampati: II  TM Distance: >3 FB Neck ROM: Full    Dental  (+) Poor Dentition, Missing, Loose, Dental Advisory Given   Pulmonary sleep apnea , COPD,  COPD inhaler, Current Smoker,    breath sounds clear to auscultation       Cardiovascular hypertension, Pt. on medications + CAD (11/24/2019 severe 3v ASCADz), + CABG (11/26/2019) and +CHF (milrinone, Impella)  + dysrhythmias Atrial Fibrillation  Rhythm:Regular Rate:Normal  11/26/2019 TEE: EF 15%, impella LV assist device well positioned, mild central MR  Remains on milrinone 0.25 mcg/kg/min   Neuro/Psych negative neurological ROS     GI/Hepatic negative GI ROS, Neg liver ROS,   Endo/Other  diabetes (glu 134), Oral Hypoglycemic Agents  Renal/GU negative Renal ROS     Musculoskeletal  (+) Arthritis ,   Abdominal   Peds  Hematology  (+) Blood dyscrasia (Hb 9.8), anemia ,   Anesthesia Other Findings   Reproductive/Obstetrics                           Anesthesia Physical Anesthesia Plan  ASA: IV  Anesthesia Plan: General   Post-op Pain Management:    Induction: Intravenous  PONV Risk Score and Plan: 1 and Ondansetron and Dexamethasone  Airway Management Planned: Oral ETT  Additional Equipment: Arterial line, TEE, PA Cath and Ultrasound Guidance Line Placement  Intra-op Plan:   Post-operative Plan: Possible Post-op intubation/ventilation  Informed Consent: I have reviewed the patients History and Physical, chart, labs and discussed the procedure including the risks, benefits and alternatives for the proposed anesthesia with the patient or authorized  representative who has indicated his/her understanding and acceptance.     Dental advisory given  Plan Discussed with: Surgeon and CRNA  Anesthesia Plan Comments:        Anesthesia Quick Evaluation

## 2019-12-03 NOTE — Progress Notes (Signed)
Patient ID: Kevin Underwood, male   DOB: 1961-03-22, 58 y.o.   MRN: [id number]     Advanced Heart Failure Rounding Note  PCP-Cardiologist: Rozann Lesches, MD   Subjective:    - Impella 5.5 placed 12/16 due to cardiogenic shock.  - CABG x 4 + Maze 12/18  Impella remains on P4 (will get reflux alarm at lower speeds). Good waveforms Flow 2.2 liters.   Remains on milrinone 0.25 mcg/kg/min. Co-ox 62%. Diuresing well but weight unchanged. On lasix gtt at 8.   Hurts all over but particularly sore at impella insertion site. + HA    On low-dose heparin. Hgb stable at 9.6  Objective:   Weight Range: 91.2 kg Body mass index is 28.85 kg/m.   Vital Signs:   Temp:  [97.7 F (36.5 C)-98.9 F (37.2 C)] 98.2 F (36.8 C) (12/25 0700) Pulse Rate:  [84] 84 (12/24 2011) Resp:  [12-26] 17 (12/25 1000) BP: (95-121)/(65-75) 114/75 (12/25 1000) SpO2:  [30 %-100 %] 72 % (12/25 1000) Arterial Line BP: (92-174)/(48-82) 98/82 (12/25 0000) Weight:  [91.2 kg] 91.2 kg (12/25 0500) Last BM Date: 12/03/19  Weight change: Filed Weights   12/01/19 0500 12/02/19 0500 12/03/19 0500  Weight: 91.7 kg 91.5 kg 91.2 kg    Intake/Output:   Intake/Output Summary (Last 24 hours) at 12/03/2019 1036 Last data filed at 12/03/2019 1000 Gross per 24 hour  Intake 867.78 ml  Output 3990 ml  Net -3122.22 ml      Physical Exam  CVP 8.  General:  Sitting in chair. Sore HEENT: normal Neck: supple. JVP 7-8 . Carotids 2+ bilat; no bruits. No lymphadenopathy or thryomegaly appreciated. Cor: Left impella site mildly tender but no significant hematoma Chest tubes ok. Ecchymosis on left side PMI nondisplaced. Regular rate & rhythm. No rubs, gallops or murmurs. Lungs: clear Abdomen: soft, nontender, nondistended. No hepatosplenomegaly. No bruits or masses. Good bowel sounds. Extremities: no cyanosis, clubbing, rash, 2+ edema Neuro: alert & orientedx3, cranial nerves grossly intact. moves all 4 extremities w/o  difficulty. Affect pleasant   Telemetry   NSR 80-90s personally reviewed.   Labs    CBC Recent Labs    12/02/19 0424 12/02/19 1625 12/03/19 0400  WBC 7.5  --  8.4  HGB 9.4* 9.5* 9.6*  HCT 27.5* 28.0* 29.1*  MCV 85.1  --  88.4  PLT 129*  --  144   Basic Metabolic Panel Recent Labs    12/02/19 0417 12/02/19 1625 12/03/19 0400  NA 134* 133* 136  K 3.5 4.2 4.1  CL 99  --  99  CO2 28  --  29  GLUCOSE 124*  --  146*  BUN 10  --  10  CREATININE 0.75  --  0.65  CALCIUM 7.6*  --  7.9*  MG 1.9  --  1.7   Liver Function Tests No results for input(s): AST, ALT, ALKPHOS, BILITOT, PROT, ALBUMIN in the last 72 hours. No results for input(s): LIPASE, AMYLASE in the last 72 hours. Cardiac Enzymes No results for input(s): CKTOTAL, CKMB, CKMBINDEX, TROPONINI in the last 72 hours.  BNP: BNP (last 3 results) Recent Labs    11/22/19 0414 11/23/19 0453 11/25/19 0500  BNP 578.0* 1,054.0* 596.1*    ProBNP (last 3 results) No results for input(s): PROBNP in the last 8760 hours.   D-Dimer No results for input(s): DDIMER in the last 72 hours. Hemoglobin A1C No results for input(s): HGBA1C in the last 72 hours. Fasting Lipid Panel No results  for input(s): CHOL, HDL, LDLCALC, TRIG, CHOLHDL, LDLDIRECT in the last 72 hours. Thyroid Function Tests No results for input(s): TSH, T4TOTAL, T3FREE, THYROIDAB in the last 72 hours.  Invalid input(s): FREET3  Other results:   Imaging    DG Chest Port 1 View  Result Date: 12/03/2019 CLINICAL DATA:  57 year old male postoperative day 7 status post CABG. EXAM: PORTABLE CHEST 1 VIEW COMPARISON:  12/02/2019 and earlier. FINDINGS: Portable AP semi upright view at 0752 hours. Right IJ Swan-Ganz catheter removed. Stable right PICC line and right subclavian approach cardiac assist device. Stable right chest tube and mediastinal tube. Larger lung volumes and improved left upper lung ventilation. However, there is a trace pneumothorax now  visible at the left apex, lateral 4th rib. No right pneumothorax identified. Suspect stable pleural effusion loculated in the right lung fissure. Dense left lung base opacity persists with some air bronchograms, obscured left hemidiaphragm. Negative visible bowel gas pattern. Stable visualized osseous structures. IMPRESSION: 1. Trace left pneumothorax now visible. No right pneumothorax identified. 2. Right IJ Swan-Ganz catheter removed. Otherwise stable lines and tubes. 3. Improved ventilation since yesterday but continued dense left lung base collapse or consolidation with possible superimposed pleural effusion. Electronically Signed   By: Odessa Fleming M.D.   On: 12/03/2019 09:02     Medications:     Scheduled Medications: . sodium chloride   Intravenous Once  . amiodarone  400 mg Oral Daily  . arformoterol  15 mcg Nebulization BID  . aspirin  81 mg Oral Daily  . budesonide (PULMICORT) nebulizer solution  0.5 mg Nebulization BID  . carvedilol  3.125 mg Oral BID WC  . Chlorhexidine Gluconate Cloth  6 each Topical Daily  . clopidogrel  75 mg Oral Daily  . colchicine  0.6 mg Oral BID  . feeding supplement (ENSURE ENLIVE)  237 mL Oral TID BM  . fentaNYL (SUBLIMAZE) injection  50 mcg Intravenous Once  . insulin aspart  0-20 Units Subcutaneous Q4H  . ipratropium  0.5 mg Nebulization BID  . ketorolac  15 mg Intravenous Q6H  . mouth rinse  15 mL Mouth Rinse BID  . multivitamin with minerals  1 tablet Oral Daily  . pantoprazole (PROTONIX) IV  40 mg Intravenous QHS  . potassium chloride  40 mEq Oral BID  . rosuvastatin  40 mg Oral q1800  . senna-docusate  1 tablet Oral Daily  . sodium chloride flush  3 mL Intravenous Q12H  . spironolactone  25 mg Oral Daily    Infusions: . sodium chloride Stopped (12/02/19 0957)  . sodium chloride    . sodium chloride 20 mL/hr at 11/26/19 1445  . furosemide (LASIX) infusion 8 mg/hr (12/03/19 1000)  . impella catheter heparin 50 unit/mL in dextrose 5%    .  lactated ringers    . lactated ringers Stopped (11/29/19 1353)  . magnesium sulfate bolus IVPB 2 g (12/03/19 1003)  . milrinone 0.25 mcg/kg/min (12/03/19 1000)    PRN Medications: sodium chloride, fentaNYL, levalbuterol, metoprolol tartrate, ondansetron (ZOFRAN) IV, oxyCODONE, sodium chloride flush, sorbitol, traMADol    Assessment/Plan   1. Acute systolic CHF => cardiogenic shock:  Echo showed EF < 20% with mildly decreased RV systolic function. He was admitted with afib/RVR and CHF.  Cath with elevated filing pressures, low CO (CI 1.4 by RHC), and severe 3 vessel coronary disease.  Ischemic cardiomyopathy, possibly contribution from tachycardia with previous atrial fibrillation. Impella 5.5 placed due to cardiogenic shock on 12/16.  CABG on 12/18 with  Maze.  - Impella at P4 today.  Flows stable at 2.2. Will recheck position tomorrow  - Remains on milrinone 0.25 Co-ox 62% .  - Weight still up about 25 pounds despite lasix gtt. Give metolazone 2.5mg  x 1 - Continue 12.5 mg spiro daily.  - Renal function stable 2. Atrial fibrillation: With RVR at admission, back in NSR now.  Maze with surgery.  - Remains in NSR. On low-dose heparin. hgb stable at 9.6 - On amio 400 mg daily.  3. CAD: Severe 3VD in diabetic smoker.  He has exertional chest pain at baseline.  Was not admitted with ACS. Now s/p CABG.  - Continue ASA 81 daily.  - rosuvastatin 40 daily.    - mediastinal tubes coming out today 4. ABLA from impella site - Hgb 9.6. Follow daily CBC  - tolarating low-dose heparin. Would not go higher. Discussed dosing with PharmD personally. 5. 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  Suspect group 2 + group 3 (COPD).  PA pressures have come down with diuresis.  6. Diabetes: Poor control.  Per IM, would eventually benefit from SGLT2 inhibitor.  7. OSA: Strongly suspected.  8. COPD: Suspected, former smoker.  Emphysema seen on CT chest.  9. ID: Afebrile, WBC 7.5    10. Acute hypoxemic respiratory failure: Intubated post-CABG.  Extubated 11/29/19  - CXR reviewed personally with Dr. Vickey Sages. Stable R effusion in fissure. Small left apical PTX - Sats ok On 3 liters [employer].  11. Thrombocytopenia: - resolved 12. PVCs/NSVT - continue amio - Keep mg > 2.0 K > 4.0 13. Hypokalemia - K 4.1 today - Continue spiro 12.5 daily.   Consult PT/OT. CIR following. Mobilize as tolerated  CRITICAL CARE Performed by: Arvilla Meres  Total critical care time: 35 minutes  Critical care time was exclusive of separately billable procedures and treating other patients.  Critical care was necessary to treat or prevent imminent or life-threatening deterioration.  Critical care was time spent personally by me (independent of midlevel providers or residents) on the following activities: development of treatment plan with patient and/or surrogate as well as nursing, discussions with consultants, evaluation of patient's response to treatment, examination of patient, obtaining history from patient or surrogate, ordering and performing treatments and interventions, ordering and review of laboratory studies, ordering and review of radiographic studies, pulse oximetry and re-evaluation of patient's condition.   Length of Stay: 13  Arvilla Meres, MD  12/03/2019, 10:36 AM  Advanced Heart Failure Team Pager [phone] (M-F; 7a - 4p)  Please contact [facility] for night-coverage after hours (4p -7a ) and weekends on [email or web address]

## 2019-12-03 NOTE — Progress Notes (Signed)
7 Days Post-Op Procedure(s) (LRB): CORONARY ARTERY BYPASS GRAFTING (CABG) using endoscopic greater saphenous vein harvest: svc to OM; svc to Diag; svc to PD; and LIMA to LAD. (N/A) TRANSESOPHAGEAL ECHOCARDIOGRAM (TEE) (N/A) INDOCYANINE GREEN FLUORESCENCE IMAGING (ICG) (N/A) MAZE using Bilateral Pulmonary Vein isolation. (N/A) Clipping Of Atrial Appendage using AtriCure 40 Clip (N/A) Subjective: Sore all over, headache  Objective: Vital signs in last 24 hours: Temp:  [97.7 F (36.5 C)-98.9 F (37.2 C)] 98.2 F (36.8 C) (12/25 0700) Pulse Rate:  [84] 84 (12/24 2011) Cardiac Rhythm: Normal sinus rhythm (12/25 0800) Resp:  [12-26] 17 (12/25 0800) BP: (95-121)/(65-75) 119/75 (12/25 0800) SpO2:  [30 %-100 %] 95 % (12/25 0800) Arterial Line BP: (92-174)/(48-82) 98/82 (12/25 0000) Weight:  [91.2 kg] 91.2 kg (12/25 0500)  Hemodynamic parameters for last 24 hours: PAP: (26-34)/(12-18) 27/12 CVP:  [3 mmHg-39 mmHg] 8 mmHg  Intake/Output from previous day: 12/24 0701 - 12/25 0700 In: 1048.7 [P.O.:150; I.V.:567.8; IV Piggyback:92.5] Out: 9357 [Urine:2235; Chest Tube:1750] Intake/Output this shift: Total I/O In: 31.1 [I.V.:20.3; Other:10.8] Out: 295 [Urine:265; Chest Tube:30]  General appearance: alert and cooperative Neurologic: intact Heart: regular rate and rhythm, S1, S2 normal, no murmur, click, rub or gallop Lungs: clear to auscultation bilaterally Abdomen: soft, non-tender; bowel sounds normal; no masses,  no organomegaly Extremities: edema 1+ Wound: c/d/i  Lab Results: Recent Labs    12/02/19 0424 12/02/19 1625 12/03/19 0400  WBC 7.5  --  8.4  HGB 9.4* 9.5* 9.6*  HCT 27.5* 28.0* 29.1*  PLT 129*  --  162   BMET:  Recent Labs    12/02/19 0417 12/02/19 1625 12/03/19 0400  NA 134* 133* 136  K 3.5 4.2 4.1  CL 99  --  99  CO2 28  --  29  GLUCOSE 124*  --  146*  BUN 10  --  10  CREATININE 0.75  --  0.65  CALCIUM 7.6*  --  7.9*    PT/INR: No results for  input(s): LABPROT, INR in the last 72 hours. ABG    Component Value Date/Time   PHART 7.446 12/02/2019 1625   HCO3 28.5 (H) 12/02/2019 1625   TCO2 30 12/02/2019 1625   ACIDBASEDEF 6.0 (H) 11/24/2019 1718   O2SAT 61.7 12/03/2019 0400   CBG (last 3)  Recent Labs    12/02/19 2027 12/02/19 2355 12/03/19 0412  GLUCAP 227* 115* 170*    Assessment/Plan: S/P Procedure(s) (LRB): CORONARY ARTERY BYPASS GRAFTING (CABG) using endoscopic greater saphenous vein harvest: svc to OM; svc to Diag; svc to PD; and LIMA to LAD. (N/A) TRANSESOPHAGEAL ECHOCARDIOGRAM (TEE) (N/A) INDOCYANINE GREEN FLUORESCENCE IMAGING (ICG) (N/A) MAZE using Bilateral Pulmonary Vein isolation. (N/A) Clipping Of Atrial Appendage using AtriCure 40 Clip (N/A) Mobilize Diuresis remove 2 more chest tubes  Hold systemic heparin due to persistent chest tube output and plan for Impella removal tomorrow   LOS: 13 days    Wonda Olds 12/03/2019

## 2019-12-04 ENCOUNTER — Inpatient Hospital Stay (HOSPITAL_COMMUNITY): Payer: 59

## 2019-12-04 ENCOUNTER — Inpatient Hospital Stay (HOSPITAL_COMMUNITY): Payer: 59 | Admitting: Certified Registered Nurse Anesthetist

## 2019-12-04 ENCOUNTER — Encounter (HOSPITAL_COMMUNITY)
Admission: EM | Disposition: A | Discharge status: Home / Self Care | Payer: Self-pay | Source: Home / Self Care | Attending: Cardiothoracic Surgery

## 2019-12-04 HISTORY — PX: INTRAOPERATIVE TRANSESOPHAGEAL ECHOCARDIOGRAM: SHX5062

## 2019-12-04 HISTORY — PX: REMOVAL OF IMPELLA LEFT VENTRICULAR ASSIST DEVICE: SHX6556

## 2019-12-04 LAB — TYPE AND SCREEN
ABO/RH(D): B POS
Antibody Screen: NEGATIVE

## 2019-12-04 LAB — BASIC METABOLIC PANEL
Anion gap: 7 (ref 5–15)
BUN: 9 mg/dL (ref 6–20)
CO2: 32 mmol/L (ref 22–32)
Calcium: 7.9 mg/dL — ABNORMAL LOW (ref 8.9–10.3)
Chloride: 91 mmol/L — ABNORMAL LOW (ref 98–111)
Creatinine, Ser: 0.73 mg/dL (ref 0.61–1.24)
GFR calc Af Amer: >60 mL/min (ref >60)
GFR calc non Af Amer: >60 mL/min (ref >60)
Glucose, Bld: 221 mg/dL — ABNORMAL HIGH (ref 70–99)
Potassium: 4.4 mmol/L (ref 3.5–5.1)
Sodium: 130 mmol/L — ABNORMAL LOW (ref 135–145)

## 2019-12-04 LAB — CBC
HCT: 29.7 % — ABNORMAL LOW (ref 39.0–52.0)
Hemoglobin: 9.8 g/dL — ABNORMAL LOW (ref 13.0–17.0)
MCH: 29 pg (ref 26.0–34.0)
MCHC: 33 g/dL (ref 30.0–36.0)
MCV: 87.9 fL (ref 80.0–100.0)
Platelets: 227 10*3/uL (ref 150–400)
RBC: 3.38 MIL/uL — ABNORMAL LOW (ref 4.22–5.81)
RDW: 15.1 % (ref 11.5–15.5)
WBC: 9.3 10*3/uL (ref 4.0–10.5)
nRBC: 0 % (ref 0.0–0.2)

## 2019-12-04 LAB — COOXEMETRY PANEL
Carboxyhemoglobin: 4.2 % — ABNORMAL HIGH (ref 0.5–1.5)
Carboxyhemoglobin: 4 % — ABNORMAL HIGH (ref 0.5–1.5)
Methemoglobin: 0 % (ref 0.0–1.5)
Methemoglobin: 0.1 % (ref 0.0–1.5)
O2 Saturation: 44.8 %
O2 Saturation: 60.8 %
Total hemoglobin: 12 g/dL (ref 12.0–16.0)
Total hemoglobin: 9.6 g/dL — ABNORMAL LOW (ref 12.0–16.0)

## 2019-12-04 LAB — ECHO INTRAOPERATIVE TEE
Height: 70 in
Weight: 3156.99 oz

## 2019-12-04 LAB — GLUCOSE, CAPILLARY
Glucose-Capillary: 134 mg/dL — ABNORMAL HIGH (ref 70–99)
Glucose-Capillary: 157 mg/dL — ABNORMAL HIGH (ref 70–99)
Glucose-Capillary: 195 mg/dL — ABNORMAL HIGH (ref 70–99)
Glucose-Capillary: 280 mg/dL — ABNORMAL HIGH (ref 70–99)
Glucose-Capillary: 312 mg/dL — ABNORMAL HIGH (ref 70–99)
Glucose-Capillary: 324 mg/dL — ABNORMAL HIGH (ref 70–99)
Glucose-Capillary: 366 mg/dL — ABNORMAL HIGH (ref 70–99)

## 2019-12-04 LAB — MAGNESIUM: Magnesium: 1.7 mg/dL (ref 1.7–2.4)

## 2019-12-04 LAB — HEPARIN LEVEL (UNFRACTIONATED): Heparin Unfractionated: <0.1 IU/mL — ABNORMAL LOW (ref 0.30–0.70)

## 2019-12-04 SURGERY — REMOVAL, CARDIAC ASSIST DEVICE, IMPELLA
Anesthesia: General | Laterality: Right

## 2019-12-04 MED ORDER — PROPOFOL 10 MG/ML IV BOLUS
INTRAVENOUS | Status: AC
  Filled 2019-12-04: qty 20

## 2019-12-04 MED ORDER — FENTANYL CITRATE (PF) 250 MCG/5ML IJ SOLN
INTRAMUSCULAR | Status: DC | PRN
  Administered 2019-12-04: 50 ug via INTRAVENOUS
  Administered 2019-12-04: 200 ug via INTRAVENOUS
  Administered 2019-12-04: 50 ug via INTRAVENOUS

## 2019-12-04 MED ORDER — MIDAZOLAM HCL 2 MG/2ML IJ SOLN
INTRAMUSCULAR | Status: AC
  Filled 2019-12-04: qty 2

## 2019-12-04 MED ORDER — ENOXAPARIN SODIUM 40 MG/0.4ML ~~LOC~~ SOLN
40.0000 mg | SUBCUTANEOUS | Status: DC
  Administered 2019-12-04 – 2019-12-07 (×4): 40 mg via SUBCUTANEOUS
  Filled 2019-12-04 (×4): qty 0.4

## 2019-12-04 MED ORDER — LACTATED RINGERS IV SOLN: INTRAVENOUS | Status: DC | PRN

## 2019-12-04 MED ORDER — FENTANYL CITRATE (PF) 250 MCG/5ML IJ SOLN
INTRAMUSCULAR | Status: AC
  Filled 2019-12-04: qty 5

## 2019-12-04 MED ORDER — PHENYLEPHRINE HCL (PRESSORS) 10 MG/ML IV SOLN
INTRAVENOUS | Status: DC | PRN
  Administered 2019-12-04 (×2): 40 ug via INTRAVENOUS

## 2019-12-04 MED ORDER — MIDAZOLAM HCL 2 MG/2ML IJ SOLN
INTRAMUSCULAR | Status: DC | PRN
  Administered 2019-12-04 (×2): 1 mg via INTRAVENOUS

## 2019-12-04 MED ORDER — ETOMIDATE 2 MG/ML IV SOLN
INTRAVENOUS | Status: DC | PRN
  Administered 2019-12-04: 14 mg via INTRAVENOUS

## 2019-12-04 MED ORDER — ONDANSETRON HCL 4 MG/2ML IJ SOLN
INTRAMUSCULAR | Status: DC | PRN
  Administered 2019-12-04: 4 mg via INTRAVENOUS

## 2019-12-04 MED ORDER — MAGNESIUM SULFATE 2 GM/50ML IV SOLN
2.0000 g | Freq: Once | INTRAVENOUS | Status: AC
  Administered 2019-12-04: 05:00:00 2 g via INTRAVENOUS
  Filled 2019-12-04: qty 50

## 2019-12-04 MED ORDER — SUGAMMADEX SODIUM 200 MG/2ML IV SOLN
INTRAVENOUS | Status: DC | PRN
  Administered 2019-12-04: 200 mg via INTRAVENOUS

## 2019-12-04 MED ORDER — EPINEPHRINE HCL 5 MG/250ML IV SOLN IN NS
0.5000 ug/min | INTRAVENOUS | Status: DC
  Administered 2019-12-04: 2 ug/min via INTRAVENOUS
  Filled 2019-12-04 (×3): qty 250

## 2019-12-04 MED ORDER — 0.9 % SODIUM CHLORIDE (POUR BTL) OPTIME
TOPICAL | Status: DC | PRN
  Administered 2019-12-04: 2000 mL

## 2019-12-04 MED ORDER — DEXAMETHASONE SODIUM PHOSPHATE 10 MG/ML IJ SOLN
INTRAMUSCULAR | Status: DC | PRN
  Administered 2019-12-04: 10 mg via INTRAVENOUS

## 2019-12-04 MED ORDER — CEFAZOLIN SODIUM-DEXTROSE 2-3 GM-%(50ML) IV SOLR
INTRAVENOUS | Status: DC | PRN
  Administered 2019-12-04: 2 g via INTRAVENOUS

## 2019-12-04 MED ORDER — FUROSEMIDE 10 MG/ML IJ SOLN
6.0000 mg/h | INTRAVENOUS | Status: DC
  Administered 2019-12-04: 6 mg/h via INTRAVENOUS
  Filled 2019-12-04: qty 25

## 2019-12-04 MED ORDER — PHENYLEPHRINE HCL-NACL 10-0.9 MG/250ML-% IV SOLN
INTRAVENOUS | Status: DC | PRN
  Administered 2019-12-04: 25 ug/min via INTRAVENOUS

## 2019-12-04 MED ORDER — ROCURONIUM BROMIDE 10 MG/ML (PF) SYRINGE
PREFILLED_SYRINGE | INTRAVENOUS | Status: DC | PRN
  Administered 2019-12-04 (×2): 50 mg via INTRAVENOUS

## 2019-12-04 SURGICAL SUPPLY — 54 items
CABLE PACING FASLOC BLUE (MISCELLANEOUS) ×3 IMPLANT
CANISTER SUCT 3000ML PPV (MISCELLANEOUS) ×3 IMPLANT
CLIP VESOCCLUDE LG 6/CT (CLIP) ×3 IMPLANT
CLIP VESOCCLUDE MED 24/CT (CLIP) ×3 IMPLANT
CLIP VESOCCLUDE SM WIDE 24/CT (CLIP) ×3 IMPLANT
DRAIN SNY 10 ROU (WOUND CARE) ×3 IMPLANT
DRAPE CARDIOVASCULAR INCISE (DRAPES) ×1
DRAPE SLUSH/WARMER DISC (DRAPES) ×3 IMPLANT
DRAPE SRG 135X102X78XABS (DRAPES) ×2 IMPLANT
ELECT BLADE 4.0 EZ CLEAN MEGAD (MISCELLANEOUS) ×3
ELECT REM PT RETURN 9FT ADLT (ELECTROSURGICAL) ×6
ELECTRODE BLDE 4.0 EZ CLN MEGD (MISCELLANEOUS) ×2 IMPLANT
ELECTRODE REM PT RTRN 9FT ADLT (ELECTROSURGICAL) ×4 IMPLANT
EVACUATOR SILICONE 100CC (DRAIN) ×3 IMPLANT
GAUZE SPONGE 4X4 12PLY STRL (GAUZE/BANDAGES/DRESSINGS) ×3 IMPLANT
GLOVE BIO SURGEON STRL SZ 6.5 (GLOVE) ×3 IMPLANT
GLOVE BIOGEL PI IND STRL 6.5 (GLOVE) ×2 IMPLANT
GLOVE BIOGEL PI INDICATOR 6.5 (GLOVE) ×1
GOWN STRL REUS W/ TWL LRG LVL3 (GOWN DISPOSABLE) ×8 IMPLANT
GOWN STRL REUS W/TWL LRG LVL3 (GOWN DISPOSABLE) ×4
INSERT FOGARTY SM (MISCELLANEOUS) ×6 IMPLANT
INSERT FOGARTY XLG (MISCELLANEOUS) IMPLANT
KIT BASIN OR (CUSTOM PROCEDURE TRAY) ×3 IMPLANT
KIT TURNOVER KIT B (KITS) ×3 IMPLANT
NS IRRIG 1000ML POUR BTL (IV SOLUTION) ×6 IMPLANT
PACK CHEST (CUSTOM PROCEDURE TRAY) ×3 IMPLANT
PAD ARMBOARD 7.5X6 YLW CONV (MISCELLANEOUS) ×6 IMPLANT
PAD ELECT DEFIB RADIOL ZOLL (MISCELLANEOUS) ×3 IMPLANT
PUNCH AORTIC ROTATE 4.0MM (MISCELLANEOUS) IMPLANT
PUNCH AORTIC ROTATE 4.5MM 8IN (MISCELLANEOUS) IMPLANT
PUNCH AORTIC ROTATE 5MM 8IN (MISCELLANEOUS) IMPLANT
STRIP CLOSURE SKIN 1/2X4 (GAUZE/BANDAGES/DRESSINGS) ×3 IMPLANT
SUT ETHILON 2 0 FS 18 (SUTURE) ×3 IMPLANT
SUT MNCRL AB 3-0 PS2 18 (SUTURE) ×3 IMPLANT
SUT MNCRL AB 4-0 PS2 18 (SUTURE) IMPLANT
SUT PROLENE 3 0 SH DA (SUTURE) IMPLANT
SUT PROLENE 3 0 SH1 36 (SUTURE) IMPLANT
SUT PROLENE 4 0 RB 1 (SUTURE) ×2
SUT PROLENE 4-0 RB1 .5 CRCL 36 (SUTURE) ×4 IMPLANT
SUT PROLENE 5 0 C 1 36 (SUTURE) IMPLANT
SUT PROLENE 6 0 C 1 30 (SUTURE) IMPLANT
SUT PROLENE 8 0 BV175 6 (SUTURE) IMPLANT
SUT SILK  1 MH (SUTURE)
SUT SILK 1 MH (SUTURE) IMPLANT
SUT SILK 2 0 SH CR/8 (SUTURE) IMPLANT
SUT SILK 3 0 SH CR/8 (SUTURE) IMPLANT
SUT VIC AB 2-0 CT1 27 (SUTURE) ×1
SUT VIC AB 2-0 CT1 TAPERPNT 27 (SUTURE) ×2 IMPLANT
SUT VIC AB 2-0 CTX 27 (SUTURE) IMPLANT
SUT VIC AB 3-0 X1 27 (SUTURE) IMPLANT
TAPE CLOTH SURG 4X10 WHT LF (GAUZE/BANDAGES/DRESSINGS) ×3 IMPLANT
TOWEL GREEN STERILE (TOWEL DISPOSABLE) ×3 IMPLANT
UNDERPAD 30X30 (UNDERPADS AND DIAPERS) ×3 IMPLANT
WATER STERILE IRR 1000ML POUR (IV SOLUTION) ×6 IMPLANT

## 2019-12-04 NOTE — Anesthesia Procedure Notes (Signed)
Central Venous Catheter Insertion Performed by: Annye Asa, MD, anesthesiologist Start/End12/26/2020 8:32 AM, 12/04/2019 8:49 AM Patient location: OR. Preanesthetic checklist: patient identified, IV checked, risks and benefits discussed, surgical consent, monitors and equipment checked, pre-op evaluation, timeout performed and anesthesia consent Position: supine Lidocaine 1% used for infiltration Hand hygiene performed , maximum sterile barriers used  and Seldinger technique used Catheter size: 8.5 Fr PA cath was placed.Sheath introducer Swan type:thermodilution Procedure performed using ultrasound guided technique. Ultrasound Notes:anatomy identified, needle tip was noted to be adjacent to the nerve/plexus identified, no ultrasound evidence of intravascular and/or intraneural injection and image(s) printed for medical record Attempts: 1 Following insertion, line sutured, dressing applied and Biopatch. Post procedure assessment: blood return through all ports, no air and free fluid flow  Patient tolerated the procedure well with no immediate complications. Additional procedure comments: PA catheter:  Routine monitors. Timeout, sterile prep, drape, FBP L neck. Supine position.  1% Lido local, finder and trocar LIJ 1st pass with US guidance.  Cordis placed over J wire. PA catheter in easily.  Sterile dressing applied.  Patient tolerated well, VSS.  Jenita Seashore, MD.

## 2019-12-04 NOTE — Anesthesia Procedure Notes (Signed)
Procedure Name: Intubation Date/Time: 12/04/2019 9:00 AM Performed by: Clearnce Sorrel, CRNA Pre-anesthesia Checklist: Patient identified, Emergency Drugs available, Suction available, Patient being monitored and Timeout performed Patient Re-evaluated:Patient Re-evaluated prior to induction Oxygen Delivery Method: Circle system utilized Preoxygenation: Pre-oxygenation with 100% oxygen Induction Type: IV induction Ventilation: Mask ventilation without difficulty Laryngoscope Size: Mac and 4 Grade View: Grade I Tube type: Oral Tube size: 8.0 mm Number of attempts: 1 Airway Equipment and Method: Stylet Placement Confirmation: ETT inserted through vocal cords under direct vision,  positive ETCO2 and breath sounds checked- equal and bilateral Secured at: 23 cm Tube secured with: Tape Dental Injury: Teeth and Oropharynx as per pre-operative assessment

## 2019-12-04 NOTE — Progress Notes (Signed)
  Echocardiogram Echocardiogram Transesophageal has been performed.  Jennette Dubin 12/04/2019, 9:24 AM

## 2019-12-04 NOTE — Anesthesia Procedure Notes (Signed)
Arterial Line Insertion Start/End12/26/2020 8:38 AM, 12/04/2019 8:38 AM Performed by: Lance Coon, CRNA  Patient location: Pre-op. Preanesthetic checklist: patient identified, IV checked, site marked, risks and benefits discussed, surgical consent, monitors and equipment checked, pre-op evaluation, timeout performed and anesthesia consent Lidocaine 1% used for infiltration Left, radial was placed Catheter size: 20 Fr Hand hygiene performed  and maximum sterile barriers used   Attempts: 1 Procedure performed without using ultrasound guided technique. Following insertion, dressing applied. Post procedure assessment: normal and unchanged

## 2019-12-04 NOTE — Progress Notes (Signed)
Report given to [facility], Immunologist. Patient transported on tele to OR with CRNA, RN, and Perfusionist.

## 2019-12-04 NOTE — Progress Notes (Addendum)
Coox was drawn this AM immediately after walking. Pt tolerated walking fair. Took two short breaks to complete 121ft.   Type and screen was sent in preporation for procedure this AM. Pre-procedure flowsheet will be completed on call to OR.   Paged Cardiology regarding orders for Mg replacement. Orders placed by MD. See MAR.

## 2019-12-04 NOTE — Op Note (Signed)
Procedure(s): REMOVAL OF IMPELLA LEFT VENTRICULAR ASSIST DEVICE, right axilla Intraoperative Transesophageal Echocardiogram Procedure Note  Kevin Underwood male 58 y.o. 12/04/2019  Procedure(s) and Anesthesia Type:    * REMOVAL OF IMPELLA LEFT VENTRICULAR ASSIST DEVICE, right axilla - General    * Intraoperative Transesophageal Echocardiogram  Surgeon(s) and Role:    * Wonda Olds, MD - Primary   Indications: The patient is s/p CABG for severely depressed LV function managed with Impella device which is now ready for removal based on clinical judgement.      Surgeon: Wonda Olds   Assistants: Staff  Anesthesia: General endotracheal anesthesia  ASA Class: 4    Procedure Detail  REMOVAL OF IMPELLA LEFT VENTRICULAR ASSIST DEVICE, right axilla, Intraoperative Transesophageal Echocardiogram After informed consent, he is taken to the OR at [facility] on 12.26.20 and placed in the supine position. Anesthesia is induced by GET technique. Deep invasive monitoring lines are placed. The anterior chest is cleansed and draped sterilely with betadine solution. A surgical pause is performed. Vasoactive drips are titrated in response to the TEE. The impella is withdrawn across the aortic valve and flow is suspended. The graft attached to the right axillary artery is secured with clips and a running suture across the stump. The wound is copiously irrigated and repaired in layers. All sponge instrument and needle counts are correct.   Findings: Good hemodynamics Mild MR Moderate TR EF approximately 20%  Estimated Blood Loss:  less than 50 mL         Drains: JACKSON-PRATT (JP)         Total IV Fluids: per anes  Blood Given: none          Specimens: none         Implants: none        Complications:  * No complications entered in OR log *         Disposition: ICU - extubated and stable.         Condition: stable

## 2019-12-04 NOTE — Plan of Care (Signed)
  Problem: Education: Goal: Knowledge of General Education information will improve Description: Including pain rating scale, medication(s)/side effects and non-pharmacologic comfort measures Outcome: Progressing   Problem: Health Behavior/Discharge Planning: Goal: Ability to manage health-related needs will improve Outcome: Progressing   Problem: Clinical Measurements: Goal: Ability to maintain clinical measurements within normal limits will improve Outcome: Progressing Goal: Will remain free from infection Outcome: Adequate for Discharge Goal: Diagnostic test results will improve Outcome: Progressing Goal: Respiratory complications will improve Outcome: Progressing Goal: Cardiovascular complication will be avoided Outcome: Progressing   Problem: Activity: Goal: Risk for activity intolerance will decrease Outcome: Progressing   Problem: Nutrition: Goal: Adequate nutrition will be maintained Outcome: Progressing   Problem: Coping: Goal: Level of anxiety will decrease Outcome: Progressing   Problem: Elimination: Goal: Will not experience complications related to bowel motility Outcome: Adequate for Discharge Goal: Will not experience complications related to urinary retention Outcome: Adequate for Discharge   Problem: Pain Managment: Goal: General experience of comfort will improve Outcome: Progressing   Problem: Safety: Goal: Ability to remain free from injury will improve Outcome: Adequate for Discharge   Problem: Skin Integrity: Goal: Risk for impaired skin integrity will decrease Outcome: Progressing   Problem: Activity: Goal: Capacity to carry out activities will improve Outcome: Progressing

## 2019-12-04 NOTE — H&P (Signed)
History and Physical Interval Note:  12/04/2019 7:24 AM  Kevin Underwood  has presented today for surgery, with the diagnosis of CAD.  The various methods of treatment have been discussed with the patient and family. After consideration of risks, benefits and other options for treatment, the patient has consented to Procedure: Removal of right axillary impella LVAD and Trans-esophageal echocardiogram as a surgical intervention.  The patient's history has been reviewed, patient examined, no change in status, stable for surgery.  I have reviewed the patient's chart and labs.  Questions were answered to the patient's satisfaction.     Wonda Olds

## 2019-12-04 NOTE — Progress Notes (Signed)
OT Cancellation Note  Patient Details Name: Kevin Underwood MRN: [id number] DOB: Aug 18, 1961   Cancelled Treatment:    Reason Eval/Treat Not Completed: Patient at procedure or test/ unavailable.  Will reattempt as appropriate.  Nilsa Nutting., OTR/L Acute Rehabilitation Services Pager [phone] Office [phone]   Lucille Passy M 12/04/2019, 10:07 AM

## 2019-12-04 NOTE — Progress Notes (Signed)
ANTICOAGULATION CONSULT NOTE  Pharmacy Consult for Heparin Indication: Impella  No Known Allergies  Patient Measurements: Height: 5\' 10"  (177.8 cm) Weight: 197 lb 5 oz (89.5 kg) IBW/kg (Calculated) : 73 Heparin Dosing Weight: 85.3 kg  Vital Signs: Temp: 98.1 F (36.7 C) (12/26 0327) Temp Source: Oral (12/26 0327) BP: 117/83 (12/26 0700)  Labs: Recent Labs    12/02/19 0417 12/02/19 0424 12/02/19 1625 12/03/19 0400 12/03/19 0459 12/04/19 0317  HGB  --  9.4* 9.5* 9.6*  --  9.8*  HCT  --  27.5* 28.0* 29.1*  --  29.7*  PLT  --  129*  --  162  --  227  HEPARINUNFRC <0.10*  --   --   --  <0.10* <0.10*  CREATININE 0.75  --   --  0.65  --  0.73    Estimated Creatinine Clearance: 113.3 mL/min (by C-G formula based on SCr of 0.73 mg/dL).   Medical History: Past Medical History:  Diagnosis Date  . Arthritis   . COPD (chronic obstructive pulmonary disease) ([place])   . Essential hypertension   . Type 2 diabetes mellitus [facility])     Assessment: 58 year old male admitted with newly documented cardiomyopathy with paroxysmal atrial fibrillation with RVR. Patient had progressively worsening hypoxia and mental status over the course of the day requiring emergent Impella placement in the OR and intubation. Heparin being started post Impella. Patient now s/p CABG 12/18, he continues with Impella.   Heparin has continued through purge, systemic heparin turned off 12/26 d/t oozing, and plans to remove Impella this AM. Hgb up to 9.8 this am, ACT 180 at last check, heparin levels undetectable.  Purge rate 10 ml/hr = 500 units heparin/hr.  Goal of Therapy:  Target ACT: 160-180 seconds Monitor platelets by anticoagulation protocol: Yes   Plan:  F/u after OR for Impella removal.  Uvaldo Rising, BCPS, [place] Pharmacist Phone [phone]  12/04/2019 7:54 AM

## 2019-12-04 NOTE — Anesthesia Postprocedure Evaluation (Signed)
Anesthesia Post Note  Patient: YENG PERZ  Procedure(s) Performed: REMOVAL OF IMPELLA LEFT VENTRICULAR ASSIST DEVICE, right axilla (Right ) Intraoperative Transesophageal Echocardiogram     Patient location during evaluation: SICU Anesthesia Type: General Level of consciousness: awake and alert, patient cooperative and oriented Pain management: pain level controlled Vital Signs Assessment: post-procedure vital signs reviewed and stable Respiratory status: spontaneous breathing, nonlabored ventilation, respiratory function stable and patient connected to nasal cannula oxygen Cardiovascular status: blood pressure returned to baseline and stable (still on milrinone) Postop Assessment: no apparent nausea or vomiting and adequate PO intake Anesthetic complications: no    Last Vitals:  Vitals:   12/04/19 1152 12/04/19 1200  BP:    Pulse:    Resp: 20 17  Temp: 36.6 C 36.6 C  SpO2: 94% 90%    Last Pain:  Vitals:   12/04/19 1200  TempSrc:   PainSc: 0-No pain                 Lilton Pare,E. [place]

## 2019-12-04 NOTE — Transfer of Care (Signed)
Immediate Anesthesia Transfer of Care Note  Patient: Kevin Underwood  Procedure(s) Performed: REMOVAL OF IMPELLA LEFT VENTRICULAR ASSIST DEVICE, right axilla (Right ) Intraoperative Transesophageal Echocardiogram  Patient Location: SICU  Anesthesia Type:General  Level of Consciousness: awake, alert  and oriented  Airway & Oxygen Therapy: Patient Spontanous Breathing and Patient connected to nasal cannula oxygen  Post-op Assessment: Report given to RN and Post -op Vital signs reviewed and stable  Post vital signs: Reviewed and stable  Last Vitals:  Vitals Value Taken Time  BP    Temp 36 C 12/04/19 1034  Pulse    Resp 8 12/04/19 1034  SpO2 95 % 12/04/19 1034  Vitals shown include unvalidated device data.  Last Pain:  Vitals:   12/04/19 0700  TempSrc:   PainSc: 0-No pain      Patients Stated Pain Goal: 5 (08/31/29 0762)  Complications: No apparent anesthesia complications

## 2019-12-04 NOTE — Progress Notes (Signed)
Patient ID: Kevin Underwood, male   DOB: 1961-08-03, 58 y.o.   MRN: [id number]     Advanced Heart Failure Rounding Note  PCP-Cardiologist: Nona Dell, MD   Subjective:    - Impella 5.5 placed 12/16 due to cardiogenic shock.  - CABG x 4 + Maze 12/18 - Impella removed 12/26   Impella removed this am. On epi 2 and milrinone 0.25 mcg/kg/min. Co-ox pending. Has diuresed well on lasix gtt. Weight down 4 pounds. (baseline about 190)   Feeling better. SOB improved.   Swan #s reviewed personally CVP 6 PA 46/21 PCWP 17 Thermo 4.5/2.2  Objective:   Weight Range: 89.5 kg Body mass index is 28.31 kg/m.   Vital Signs:   Temp:  [96.8 F (36 C)-99.9 F (37.7 C)] 97.9 F (36.6 C) (12/26 1200) Resp:  [9-24] 17 (12/26 1200) BP: (91-117)/(60-83) 117/83 (12/26 0700) SpO2:  [90 %-100 %] 90 % (12/26 1200) Weight:  [89.5 kg] 89.5 kg (12/26 0329) Last BM Date: 12/03/19  Weight change: Filed Weights   12/02/19 0500 12/03/19 0500 12/04/19 0329  Weight: 91.5 kg 91.2 kg 89.5 kg    Intake/Output:   Intake/Output Summary (Last 24 hours) at 12/04/2019 1218 Last data filed at 12/04/2019 1100 Gross per 24 hour  Intake 1318.89 ml  Output 3815 ml  Net -2496.11 ml      Physical Exam   General:  Sitting up in bed  No resp difficulty HEENT: normal Neck: supple. JVP 7  LIJ swan Carotids 2+ bilat; no bruits. No lymphadenopathy or thryomegaly appreciated. Cor: PMI nondisplaced. Regular rate & rhythm. No rubs, gallops or murmurs. Impella site ok Lungs: clear. Dull at bases Abdomen: soft, nontender, nondistended. No hepatosplenomegaly. No bruits or masses. Good bowel sounds. Extremities: no cyanosis, clubbing, rash, 1+ edema Neuro: alert & orientedx3, cranial nerves grossly intact. moves all 4 extremities w/o difficulty. Affect pleasant    Telemetry   A-paced at 90 personally reviewed.   Labs    CBC Recent Labs    12/03/19 0400 12/04/19 0317  WBC 8.4 9.3  HGB 9.6* 9.8*    HCT 29.1* 29.7*  MCV 88.4 87.9  PLT 162 227   Basic Metabolic Panel Recent Labs    42/39/53 0400 12/04/19 0317  NA 136 130*  K 4.1 4.4  CL 99 91*  CO2 29 32  GLUCOSE 146* 221*  BUN 10 9  CREATININE 0.65 0.73  CALCIUM 7.9* 7.9*  MG 1.7 1.7   Liver Function Tests No results for input(s): AST, ALT, ALKPHOS, BILITOT, PROT, ALBUMIN in the last 72 hours. No results for input(s): LIPASE, AMYLASE in the last 72 hours. Cardiac Enzymes No results for input(s): CKTOTAL, CKMB, CKMBINDEX, TROPONINI in the last 72 hours.  BNP: BNP (last 3 results) Recent Labs    11/22/19 0414 11/23/19 0453 11/25/19 0500  BNP 578.0* 1,054.0* 596.1*    ProBNP (last 3 results) No results for input(s): PROBNP in the last 8760 hours.   D-Dimer No results for input(s): DDIMER in the last 72 hours. Hemoglobin A1C No results for input(s): HGBA1C in the last 72 hours. Fasting Lipid Panel No results for input(s): CHOL, HDL, LDLCALC, TRIG, CHOLHDL, LDLDIRECT in the last 72 hours. Thyroid Function Tests No results for input(s): TSH, T4TOTAL, T3FREE, THYROIDAB in the last 72 hours.  Invalid input(s): FREET3  Other results:   Imaging    DG Chest Port 1 View  Result Date: 12/04/2019 CLINICAL DATA:  Line placement. EXAM: PORTABLE CHEST 1 VIEW COMPARISON:  12/04/2019 FINDINGS: Right-sided PICC line unchanged. Interval placement of left IJ Swan-Ganz catheter as the tip of the catheter is looped over the main pulmonary artery segment. Catheter entering the reactive right heart from below extends of to the left hilar region unchanged likely over a left pulmonary artery. Interval removal of right subclavian Impella device. Lungs are adequately inflated with stable focal pleural prominence over the lateral right midlung. Stable opacification in the left base likely effusion with atelectasis. Mild hazy prominence of the perihilar markings likely vascular congestion. Stable cardiomegaly. Remainder of the exam  is unchanged. IMPRESSION: 1. Stable left base opacification likely effusion with atelectasis. Infection in the left base is possible. Stable focal pleural thickening over the lateral right mid thorax. 2. Tubes and lines as described. Note that the left IJ Swan-Ganz catheter has tip looped over the main pulmonary artery segment. These results were called by telephone at the time of interpretation on 12/04/2019 at 11:21 am to patient's nurse, Verline Lema who verbally acknowledged these results and states care team is aware. Electronically Signed   By: Marin Olp M.D.   On: 12/04/2019 11:22   DG Chest Port 1 View  Result Date: 12/04/2019 CLINICAL DATA:  History of open heart surgery EXAM: PORTABLE CHEST 1 VIEW COMPARISON:  December 03, 2019 FINDINGS: Previously noted trace left pneumothorax is unchanged. Consolidation of left lung base is unchanged. Patchy opacity of the lateral right mid lung is unchanged. Right PICC line and cardiac assist device are unchanged. Previously noted right chest tube has been removed. Mediastinal drain is unchanged. IMPRESSION: Previously noted trace left pneumothorax is unchanged. Consolidation of left lung base is unchanged. Patchy opacity of the lateral right mid lung is unchanged. Electronically Signed   By: Abelardo Diesel M.D.   On: 12/04/2019 08:33     Medications:     Scheduled Medications: . sodium chloride   Intravenous Once  . amiodarone  400 mg Oral Daily  . arformoterol  15 mcg Nebulization BID  . aspirin  81 mg Oral Daily  . budesonide (PULMICORT) nebulizer solution  0.5 mg Nebulization BID  . carvedilol  3.125 mg Oral BID WC  . Chlorhexidine Gluconate Cloth  6 each Topical Daily  . clopidogrel  75 mg Oral Daily  . colchicine  0.6 mg Oral BID  . enoxaparin (LOVENOX) injection  40 mg Subcutaneous Q24H  . feeding supplement (ENSURE ENLIVE)  237 mL Oral TID BM  . insulin aspart  0-20 Units Subcutaneous Q4H  . ipratropium  0.5 mg Nebulization BID  . mouth  rinse  15 mL Mouth Rinse BID  . multivitamin with minerals  1 tablet Oral Daily  . pantoprazole (PROTONIX) IV  40 mg Intravenous QHS  . potassium chloride  40 mEq Oral BID  . rosuvastatin  40 mg Oral q1800  . senna-docusate  1 tablet Oral Daily  . sodium chloride flush  10-40 mL Intracatheter Q12H  . sodium chloride flush  3 mL Intravenous Q12H  . spironolactone  25 mg Oral Daily    Infusions: . sodium chloride Stopped (12/02/19 0957)  . sodium chloride    . sodium chloride 20 mL/hr at 11/26/19 1445  . epinephrine 2 mcg/min (12/04/19 1030)  . furosemide (LASIX) infusion    . lactated ringers 20 mL/hr at 12/04/19 1055  . lactated ringers Stopped (11/29/19 1353)  . milrinone 0.25 mcg/kg/min (12/04/19 1030)    PRN Medications: sodium chloride, bisacodyl, fentaNYL, levalbuterol, metoprolol tartrate, ondansetron (ZOFRAN) IV, oxyCODONE, sodium chloride flush, sodium  chloride flush, sorbitol, traMADol    Assessment/Plan   1. Acute systolic CHF => cardiogenic shock:  Echo showed EF < 20% with mildly decreased RV systolic function. He was admitted with afib/RVR and CHF.  Cath with elevated filing pressures, low CO (CI 1.4 by RHC), and severe 3 vessel coronary disease.  Ischemic cardiomyopathy, possibly contribution from tachycardia with previous atrial fibrillation. Impella 5.5 placed due to cardiogenic shock on 12/16.  CABG on 12/18 with Maze.  - Impella removed today (12/04/19) - Remains on milrinone 0.25 and EPI 2 - Volume status much improved. Stil about 5 pounds to go. Back on lasix gtt at 6. Will continue 1 more day - Increase spiro to 25mg  daily - Renal function stable - Repeat echo in am - Hopefully can start Entresto in am - no b-blocker yet  2. Atrial fibrillation: With RVR at admission, back in NSR now.  Maze with surgery.  - Remains in NSR. Heparin off with recent bleeding - Use lovenox for DVT prophylaxis.  - Consider Eliquis if AF recurs - LAA has been clipped -  Continue amio 400 mg daily.  3. CAD: Severe 3VD in diabetic smoker.  He has exertional chest pain at baseline.  Was not admitted with ACS. Now s/p CABG.  - Continue ASA 81 daily.  - rosuvastatin 40 daily.    - No s/s ischemia 4. ABLA from impella site - Hgb 9.8. Stable Follow daily CBC  - Off AC as above 5. 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  Suspect group 2 + group 3 (COPD).  PA pressures have come down with diuresis.  6. Diabetes: Poor control.  Per IM, would eventually benefit from SGLT2 inhibitor.  7. OSA: Strongly suspected.  8. COPD: Suspected, former smoker.  Emphysema seen on CT chest.  9. ID: Afebrile, WBC 7.5  10. Acute hypoxemic respiratory failure: Intubated post-CABG.  Extubated 11/29/19  - CXR reviewed personally with Dr. Vickey Sages. Stable R effusion in fissure. Small left apical PTX. Swan curled in left PA. I repositioned - Sats ok On 3 liters [place].  11. Thrombocytopenia: - resolved 12. PVCs/NSVT - continue amio - Keep mg > 2.0 K > 4.0 13. Hypokalemia - K 4.4 today - Continue spiro  Consult PT/OT. CIR following. Mobilize as tolerated  CRITICAL CARE Performed by: Arvilla Meres  Total critical care time: 35 minutes  Critical care time was exclusive of separately billable procedures and treating other patients.  Critical care was necessary to treat or prevent imminent or life-threatening deterioration.  Critical care was time spent personally by me (independent of midlevel providers or residents) on the following activities: development of treatment plan with patient and/or surrogate as well as nursing, discussions with consultants, evaluation of patient's response to treatment, examination of patient, obtaining history from patient or surrogate, ordering and performing treatments and interventions, ordering and review of laboratory studies, ordering and review of radiographic studies, pulse oximetry and re-evaluation of patient's  condition.    Length of Stay: 14  Arvilla Meres, MD  12/04/2019, 12:18 PM  Advanced Heart Failure Team Pager [phone] (M-F; 7a - 4p)  Please contact [facility] for night-coverage after hours (4p -7a ) and weekends on [email or web address]

## 2019-12-05 ENCOUNTER — Inpatient Hospital Stay (HOSPITAL_COMMUNITY): Payer: 59

## 2019-12-05 DIAGNOSIS — I5021 Acute systolic (congestive) heart failure: Secondary | ICD-10-CM

## 2019-12-05 LAB — BASIC METABOLIC PANEL
Anion gap: 5 (ref 5–15)
BUN: 11 mg/dL (ref 6–20)
CO2: 30 mmol/L (ref 22–32)
Calcium: 7.7 mg/dL — ABNORMAL LOW (ref 8.9–10.3)
Chloride: 98 mmol/L (ref 98–111)
Creatinine, Ser: 0.68 mg/dL (ref 0.61–1.24)
GFR calc Af Amer: >60 mL/min (ref >60)
GFR calc non Af Amer: >60 mL/min (ref >60)
Glucose, Bld: 201 mg/dL — ABNORMAL HIGH (ref 70–99)
Potassium: 4.3 mmol/L (ref 3.5–5.1)
Sodium: 133 mmol/L — ABNORMAL LOW (ref 135–145)

## 2019-12-05 LAB — ECHOCARDIOGRAM LIMITED
Height: 70 in
Weight: 3086.44 oz

## 2019-12-05 LAB — MAGNESIUM: Magnesium: 1.7 mg/dL (ref 1.7–2.4)

## 2019-12-05 LAB — GLUCOSE, CAPILLARY
Glucose-Capillary: 131 mg/dL — ABNORMAL HIGH (ref 70–99)
Glucose-Capillary: 178 mg/dL — ABNORMAL HIGH (ref 70–99)
Glucose-Capillary: 185 mg/dL — ABNORMAL HIGH (ref 70–99)
Glucose-Capillary: 191 mg/dL — ABNORMAL HIGH (ref 70–99)
Glucose-Capillary: 192 mg/dL — ABNORMAL HIGH (ref 70–99)
Glucose-Capillary: 193 mg/dL — ABNORMAL HIGH (ref 70–99)
Glucose-Capillary: 199 mg/dL — ABNORMAL HIGH (ref 70–99)

## 2019-12-05 LAB — COOXEMETRY PANEL
Carboxyhemoglobin: 2.7 % — ABNORMAL HIGH (ref 0.5–1.5)
Methemoglobin: 0.9 % (ref 0.0–1.5)
O2 Saturation: 46.5 %
Total hemoglobin: 10.6 g/dL — ABNORMAL LOW (ref 12.0–16.0)

## 2019-12-05 MED ORDER — MAGNESIUM SULFATE 4 GM/100ML IV SOLN
4.0000 g | Freq: Once | INTRAVENOUS | Status: AC
  Administered 2019-12-05: 4 g via INTRAVENOUS
  Filled 2019-12-05: qty 100

## 2019-12-05 MED ORDER — MAGNESIUM OXIDE 400 (241.3 MG) MG PO TABS
400.0000 mg | ORAL_TABLET | Freq: Two times a day (BID) | ORAL | Status: DC
  Administered 2019-12-05 – 2019-12-08 (×7): 400 mg via ORAL
  Filled 2019-12-05 (×7): qty 1

## 2019-12-05 MED ORDER — FUROSEMIDE 80 MG PO TABS
80.0000 mg | ORAL_TABLET | Freq: Every day | ORAL | Status: DC
  Administered 2019-12-05 – 2019-12-07 (×3): 80 mg via ORAL
  Filled 2019-12-05 (×3): qty 1

## 2019-12-05 NOTE — Progress Notes (Signed)
  Echocardiogram 2D Echocardiogram has been performed.  Jennette Dubin 12/05/2019, 2:33 PM

## 2019-12-05 NOTE — Progress Notes (Addendum)
Patient ID: Kevin Underwood, male   DOB: 1961-06-06, 58 y.o.   MRN: [id number]     Advanced Heart Failure Rounding Note  PCP-Cardiologist: Rozann Lesches, MD   Subjective:    - Impella 5.5 placed 12/16 due to cardiogenic shock.  - CABG x 4 + Maze 12/18 - Impella removed 12/26  Impella removed yesterday. Feels claustrophobic. Couldn't sleep last night. Wants to get up and walk. Denies SOB or CP.   On epi 2 and milrinone 0.25 mcg/kg/min. Co-ox 47%. Has diuresed well on lasix gtt. Weight down 5 more pounds. Weight 192 today (baseline about 190)    Swan #s reviewed personally CVP 5-6 PA 40/12 Thermo 6.4/3.2  Objective:   Weight Range: 87.5 kg Body mass index is 27.68 kg/m.   Vital Signs:   Temp:  [96.8 F (36 C)-99.9 F (37.7 C)] 98.2 F (36.8 C) (12/27 0600) Resp:  [9-31] 20 (12/27 0600) BP: (107-136)/(60-72) 111/71 (12/27 0600) SpO2:  [88 %-100 %] 99 % (12/27 0600) Arterial Line BP: (108)/(50) 108/50 (12/27 0100) Weight:  [87.5 kg] 87.5 kg (12/27 0500) Last BM Date: 12/03/19  Weight change: Filed Weights   12/03/19 0500 12/04/19 0329 12/05/19 0500  Weight: 91.2 kg 89.5 kg 87.5 kg    Intake/Output:   Intake/Output Summary (Last 24 hours) at 12/05/2019 0823 Last data filed at 12/05/2019 0700 Gross per 24 hour  Intake 1407.39 ml  Output 3990 ml  Net -2582.61 ml      Physical Exam   General:  Sitting up in chair No resp difficulty HEENT: normal Neck: supple. RIJ swan Carotids 2+ bilat; no bruits. No lymphadenopathy or thryomegaly appreciated. Cor: PMI nondisplaced. Impella site and sternal wound ok Regular rate & rhythm. No rubs, gallops or murmurs. Lungs: clear Abdomen: soft, nontender, nondistended. No hepatosplenomegaly. No bruits or masses. Good bowel sounds. Extremities: no cyanosis, clubbing, rash, edema Neuro: alert & orientedx3, cranial nerves grossly intact. moves all 4 extremities w/o difficulty. Affect pleasant   Telemetry   A-paced at 90  (I turned pacer down and he is in 80-90s on his own) personally reviewed.   Labs    CBC Recent Labs    12/03/19 0400 12/04/19 0317  WBC 8.4 9.3  HGB 9.6* 9.8*  HCT 29.1* 29.7*  MCV 88.4 87.9  PLT 162 440   Basic Metabolic Panel Recent Labs    12/04/19 0317 12/05/19 0432  NA 130* 133*  K 4.4 4.3  CL 91* 98  CO2 32 30  GLUCOSE 221* 201*  BUN 9 11  CREATININE 0.73 0.68  CALCIUM 7.9* 7.7*  MG 1.7 1.7   Liver Function Tests No results for input(s): AST, ALT, ALKPHOS, BILITOT, PROT, ALBUMIN in the last 72 hours. No results for input(s): LIPASE, AMYLASE in the last 72 hours. Cardiac Enzymes No results for input(s): CKTOTAL, CKMB, CKMBINDEX, TROPONINI in the last 72 hours.  BNP: BNP (last 3 results) Recent Labs    11/22/19 0414 11/23/19 0453 11/25/19 0500  BNP 578.0* 1,054.0* 596.1*    ProBNP (last 3 results) No results for input(s): PROBNP in the last 8760 hours.   D-Dimer No results for input(s): DDIMER in the last 72 hours. Hemoglobin A1C No results for input(s): HGBA1C in the last 72 hours. Fasting Lipid Panel No results for input(s): CHOL, HDL, LDLCALC, TRIG, CHOLHDL, LDLDIRECT in the last 72 hours. Thyroid Function Tests No results for input(s): TSH, T4TOTAL, T3FREE, THYROIDAB in the last 72 hours.  Invalid input(s): FREET3  Other results:  Imaging    DG Chest Port 1 View  Result Date: 12/04/2019 CLINICAL DATA:  Coronary artery bypass grafting. EXAM: PORTABLE CHEST 1 VIEW COMPARISON:  Earlier same day FINDINGS: 1152 hours. Similar lung volumes with persistent retrocardiac collapse/consolidation and small effusion. Interstitial markings are diffusely coarsened with chronic features. Small probable focal fluid collection in the lateral pleura of the right mid chest, site of prior chest tube placement. Left IJ pulmonary artery catheter tip is in the inter lobar pulmonary artery. Single midline pericardial/mediastinal drain remains in place. Right PICC  line tip overlies the mid SVC level. Left atrial appendage clip again noted. A surgical drain is looped in the right subclavian region, at the location of the surgical clips. IMPRESSION: 1. Left IJ pulmonary artery catheter is been repositioned in the interval with the tip now in the inter lobar pulmonary artery. 2. Similar retrocardiac left base collapse/consolidation with effusion. Electronically Signed   By: Kennith Center M.D.   On: 12/04/2019 12:24   DG Chest Port 1 View  Result Date: 12/04/2019 CLINICAL DATA:  Line placement. EXAM: PORTABLE CHEST 1 VIEW COMPARISON:  12/04/2019 FINDINGS: Right-sided PICC line unchanged. Interval placement of left IJ Swan-Ganz catheter as the tip of the catheter is looped over the main pulmonary artery segment. Catheter entering the reactive right heart from below extends of to the left hilar region unchanged likely over a left pulmonary artery. Interval removal of right subclavian Impella device. Lungs are adequately inflated with stable focal pleural prominence over the lateral right midlung. Stable opacification in the left base likely effusion with atelectasis. Mild hazy prominence of the perihilar markings likely vascular congestion. Stable cardiomegaly. Remainder of the exam is unchanged. IMPRESSION: 1. Stable left base opacification likely effusion with atelectasis. Infection in the left base is possible. Stable focal pleural thickening over the lateral right mid thorax. 2. Tubes and lines as described. Note that the left IJ Swan-Ganz catheter has tip looped over the main pulmonary artery segment. These results were called by telephone at the time of interpretation on 12/04/2019 at 11:21 am to patient's nurse, Yvonna Alanis who verbally acknowledged these results and states care team is aware. Electronically Signed   By: Elberta Fortis M.D.   On: 12/04/2019 11:22   ECHO INTRAOPERATIVE TEE  Result Date: 12/04/2019  *INTRAOPERATIVE TRANSESOPHAGEAL REPORT *  Patient Name:    Kevin Underwood Date of Exam: 12/04/2019 Medical Rec #:  [id number]        Height:       70.0 in Accession #:    [id number]       Weight:       197.3 lb Date of Birth:  1960/12/28        BSA:          2.08 m Patient Age:    58 years         BP:           117/83 mmHg Patient Gender: M                HR:           64 bpm. Exam Location:  Inpatient Transesophogeal exam was perform intraoperatively during surgical procedure. Patient was closely monitored under general anesthesia during the entirety of examination. Indications:     Impella Removal Sonographer:     Thurman Coyer RDCS (AE) Performing Phys: Jairo Ben MD Diagnosing Phys: Jairo Ben MD Report CC'd to:  Valentina Lucks MD Complications: No known complications during this procedure.  POST-OP IMPRESSIONS Overall, there has been interval removal of the Impella left ventricular assist device. There were no other significant changes from the pre-bypass examination.  PRE-OP FINDINGS  Left Ventricle: The left ventricle has severely reduced systolic function, with an ejection fraction of 15-20% 25% by Simpson's. The cavity size was moderately dilated. There is no increase in left ventricular wall thickness. Left ventricular diffuse severe hypokinesis, with inferior wall and lateral wall akinesis. Right Ventricle: The right ventricle has severely reduced systolic function. The cavity was moderately enlarged. There is decreased right ventricular wall thickness. Left Atrium: Left atrial size was normal in size. The left atrial appendage has been surgically closed. Right Atrium: Right atrial size was dilated. Right atrial pressure is estimated at 10 mmHg. Interatrial Septum: No atrial level shunt detected by color flow Doppler. Pericardium: There is no evidence of pericardial effusion. Mitral Valve: The mitral valve is normal in structure. No thickening of the mitral valve leaflet. No calcification of the mitral valve leaflet. Mitral valve regurgitation is  mild by color flow Doppler. The MR jet is centrally-directed. There is no evidence of mitral valve vegetation. Tricuspid Valve: The tricuspid valve was normal in structure. Tricuspid valve regurgitation is moderate-severe by color flow Doppler. The tricuspid valve is normal. The tricuspid valve is none. Aortic Valve: The aortic valve is tricuspid. There is no thickening of the aortic valve, but there is very mild calcification of the aortic valve annulus. Aortic valve regurgitation is mild by color flow Doppler, surrounding the Impella LV assist device.  The Impella is well positioned across the aortic valve and into the L ventricle.There is no evidence of a vegetation on the aortic valve. There is no aortic stenosis. Pulmonic Valve: The pulmonic valve was not assessed, with normal. No evidence of pumonic stenosis. Pulmonic valve regurgitation is trivial, around the PA catheter, by color flow Doppler. Aorta: The aortic arch was not well visualized. There is evidence of layered immobile plaque in the descending aorta; Grade III, measuring 3-16mm in size in some areas. Pulmonary Artery: The pulmonary artery is moderately dilated. Venous: The inferior vena cava is normal in size with less than 50% respiratory variability, suggesting right atrial pressure of 8 mmHg.  +------------------+---------++ LV Volumes (MOD)            +------------------+---------++ LV area d, A4C:   42.70 cm +------------------+---------++ LV area s, A4C:   33.60 cm +------------------+---------++ LV major d, A4C:  8.31 cm   +------------------+---------++ LV major s, A4C:  7.25 cm   +------------------+---------++ LV vol d, MOD A4C:179.0 ml  +------------------+---------++ LV vol s, MOD A4C:134.0 ml  +------------------+---------++ LV SV MOD A4C:    179.0 ml  +------------------+---------++ +---------------+-----------++ TRICUSPID VALVE            +---------------+-----------++ TR Peak grad:  18.3 mmHg    +---------------+-----------++ TR Vmax:       214.00 cm/s +---------------+-----------++  Jairo Ben MD Electronically signed by Jairo Ben MD Signature Date/Time: 12/04/2019/2:19:31 PM    Final      Medications:     Scheduled Medications: . sodium chloride   Intravenous Once  . amiodarone  400 mg Oral Daily  . arformoterol  15 mcg Nebulization BID  . aspirin  81 mg Oral Daily  . budesonide (PULMICORT) nebulizer solution  0.5 mg Nebulization BID  . carvedilol  3.125 mg Oral BID WC  . Chlorhexidine Gluconate Cloth  6 each Topical Daily  . clopidogrel  75 mg Oral Daily  . colchicine  0.6 mg Oral BID  . enoxaparin (LOVENOX) injection  40 mg Subcutaneous Q24H  . feeding supplement (ENSURE ENLIVE)  237 mL Oral TID BM  . insulin aspart  0-20 Units Subcutaneous Q4H  . ipratropium  0.5 mg Nebulization BID  . magnesium oxide  400 mg Oral BID  . mouth rinse  15 mL Mouth Rinse BID  . multivitamin with minerals  1 tablet Oral Daily  . pantoprazole (PROTONIX) IV  40 mg Intravenous QHS  . potassium chloride  40 mEq Oral BID  . rosuvastatin  40 mg Oral q1800  . senna-docusate  1 tablet Oral Daily  . sodium chloride flush  10-40 mL Intracatheter Q12H  . sodium chloride flush  3 mL Intravenous Q12H  . spironolactone  25 mg Oral Daily    Infusions: . sodium chloride Stopped (12/02/19 0957)  . sodium chloride    . sodium chloride 20 mL/hr at 11/26/19 1445  . furosemide (LASIX) infusion 6 mg/hr (12/05/19 0700)  . lactated ringers 20 mL/hr at 12/04/19 1055  . lactated ringers Stopped (11/29/19 1353)  . milrinone 0.25 mcg/kg/min (12/05/19 0701)    PRN Medications: sodium chloride, bisacodyl, fentaNYL, levalbuterol, metoprolol tartrate, ondansetron (ZOFRAN) IV, oxyCODONE, sodium chloride flush, sodium chloride flush, sorbitol, traMADol    Assessment/Plan   1. Acute systolic CHF => cardiogenic shock:  Echo showed EF < 20% with mildly decreased RV systolic function. He was  admitted with afib/RVR and CHF.  Cath with elevated filing pressures, low CO (CI 1.4 by RHC), and severe 3 vessel coronary disease.  Ischemic cardiomyopathy, possibly contribution from tachycardia with previous atrial fibrillation. Impella 5.5 placed due to cardiogenic shock on 12/16.  CABG on 12/18 with Maze.  - Impella removed today (12/04/19) - Remains on milrinone 0.25 and EPI 2 Thermo #s look great. Fick low again. Clinically looks good. Can wean Epi. Pull swan. Will need to be careful following co-ox as they may be misleading - Volume status much improved. Stop lasix gtt. Start lasix 80 po daily - Continue spiro 25mg  daily - On carvedilol 3.125 bid. Will continue  - Renal function stable - Repeat limited echo today - Hopefully can start Entresto in am once stable off EPi - no b-blocker yet  2. Atrial fibrillation: With RVR at admission, back in NSR now.  Maze with surgery.  - Remains in NSR. Heparin off with recent bleeding - Continue lovenox for DVT prophylaxis.  - Consider Eliquis if AF recurs - LAA has been clipped - Continue amio 400 mg daily.  3. CAD: Severe 3VD in diabetic smoker.  He has exertional chest pain at baseline.  Was not admitted with ACS. Now s/p CABG.  - Continue ASA 81 daily.  - rosuvastatin 40 daily.    - No s/s ischemia 4. ABLA from impella site - Hgb 10.6. Stable Follow daily CBC  - Off AC as above 5. 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  Suspect group 2 + group 3 (COPD).  PA pressures have come down with diuresis.  6. Diabetes: Poor control.  Per IM, would eventually benefit from SGLT2 inhibitor.  7. OSA: Strongly suspected.  8. COPD: Suspected, former smoker.  Emphysema seen on CT chest.  9. ID: Afebrile  10. Acute hypoxemic respiratory failure: Intubated post-CABG.  Extubated 11/29/19  - CXR reviewed personally with Dr. Vickey SagesAtkins yestrday. Stable R effusion in fissure. Small left apical PTX. - Sats good on [place] 11.  Thrombocytopenia: - resolved 12. PVCs/NSVT - continue amio - Keep  mg > 2.0 K > 4.0 13. Hypokalemia - K 4.3 today - Continue spiro  Stop epi. Change lasix to po. Pull swan. CIR following.   CRITICAL CARE Performed by: Arvilla MeresBensimhon, Erron Wengert  Total critical care time: 35 minutes  Critical care time was exclusive of separately billable procedures and treating other patients.  Critical care was necessary to treat or prevent imminent or life-threatening deterioration.  Critical care was time spent personally by me (independent of midlevel providers or residents) on the following activities: development of treatment plan with patient and/or surrogate as well as nursing, discussions with consultants, evaluation of patient's response to treatment, examination of patient, obtaining history from patient or surrogate, ordering and performing treatments and interventions, ordering and review of laboratory studies, ordering and review of radiographic studies, pulse oximetry and re-evaluation of patient's condition.    Length of Stay: 15  Arvilla Meresaniel Christina Waldrop, MD  12/05/2019, 8:23 AM  Advanced Heart Failure Team Pager [phone] (M-F; 7a - 4p)  Please contact [facility] for night-coverage after hours (4p -7a ) and weekends on [email or web address]

## 2019-12-05 NOTE — Progress Notes (Signed)
1 Day Post-Op Procedure(s) (LRB): REMOVAL OF IMPELLA LEFT VENTRICULAR ASSIST DEVICE, right axilla (Right) Intraoperative Transesophageal Echocardiogram Subjective: Hard to get comfortable; wants to walk  Objective: Vital signs in last 24 hours: Temp:  [96.8 F (36 C)-99.9 F (37.7 C)] 98.2 F (36.8 C) (12/27 0600) Cardiac Rhythm: Atrial paced (12/27 0400) Resp:  [9-31] 20 (12/27 0600) BP: (107-136)/(60-72) 111/71 (12/27 0600) SpO2:  [88 %-100 %] 99 % (12/27 0600) Arterial Line BP: (108)/(50) 108/50 (12/27 0100) Weight:  [87.5 kg] 87.5 kg (12/27 0500)  Hemodynamic parameters for last 24 hours: PAP: (34-46)/(7-22) 34/7 CVP:  [6 mmHg-9 mmHg] 6 mmHg PCWP:  [17 mmHg] 17 mmHg CO:  [4.5 L/min-5.6 L/min] 5 L/min CI:  [2.2 L/min/m2-3.8 L/min/m2] 3.8 L/min/m2  Intake/Output from previous day: 12/26 0701 - 12/27 0700 In: 1417.7 [P.O.:480; I.V.:927.4] Out: 3690 [Urine:3415; Drains:20; Blood:25; Chest Tube:230] Intake/Output this shift: No intake/output data recorded.  General appearance: alert and cooperative Neurologic: intact Heart: regular rate and rhythm, S1, S2 normal, no murmur, click, rub or gallop Lungs: clear to auscultation bilaterally Abdomen: soft, non-tender; bowel sounds normal; no masses,  no organomegaly Extremities: extremities normal, atraumatic, no cyanosis or edema Wound: c/d/i  Lab Results: Recent Labs    12/03/19 0400 12/04/19 0317  WBC 8.4 9.3  HGB 9.6* 9.8*  HCT 29.1* 29.7*  PLT 162 227   BMET:  Recent Labs    12/04/19 0317 12/05/19 0432  NA 130* 133*  K 4.4 4.3  CL 91* 98  CO2 32 30  GLUCOSE 221* 201*  BUN 9 11  CREATININE 0.73 0.68  CALCIUM 7.9* 7.7*    PT/INR: No results for input(s): LABPROT, INR in the last 72 hours. ABG    Component Value Date/Time   PHART 7.446 12/02/2019 1625   HCO3 28.5 (H) 12/02/2019 1625   TCO2 30 12/02/2019 1625   ACIDBASEDEF 6.0 (H) 11/24/2019 1718   O2SAT 46.5 12/05/2019 0455   CBG (last 3)  Recent  Labs    12/04/19 1939 12/04/19 2350 12/05/19 0335  GLUCAP 324* 280* 193*    Assessment/Plan: S/P Procedure(s) (LRB): REMOVAL OF IMPELLA LEFT VENTRICULAR ASSIST DEVICE, right axilla (Right) Intraoperative Transesophageal Echocardiogram Mobilize Diuresis wean epinephrine to off; remove PA catheter once epi off  Continue milrinone at .25  txfer to 2C in am   LOS: 15 days    Kevin Underwood 12/05/2019

## 2019-12-06 ENCOUNTER — Encounter: Payer: Self-pay | Admitting: *Deleted

## 2019-12-06 ENCOUNTER — Inpatient Hospital Stay (HOSPITAL_COMMUNITY): Payer: 59

## 2019-12-06 LAB — GLUCOSE, CAPILLARY
Glucose-Capillary: 216 mg/dL — ABNORMAL HIGH (ref 70–99)
Glucose-Capillary: 160 mg/dL — ABNORMAL HIGH (ref 70–99)
Glucose-Capillary: 197 mg/dL — ABNORMAL HIGH (ref 70–99)
Glucose-Capillary: 192 mg/dL — ABNORMAL HIGH (ref 70–99)
Glucose-Capillary: 257 mg/dL — ABNORMAL HIGH (ref 70–99)
Glucose-Capillary: 155 mg/dL — ABNORMAL HIGH (ref 70–99)
Glucose-Capillary: 218 mg/dL — ABNORMAL HIGH (ref 70–99)

## 2019-12-06 LAB — CBC
HCT: 31.7 % — ABNORMAL LOW (ref 39.0–52.0)
Hemoglobin: 10.2 g/dL — ABNORMAL LOW (ref 13.0–17.0)
MCH: 29.1 pg (ref 26.0–34.0)
MCHC: 32.2 g/dL (ref 30.0–36.0)
MCV: 90.3 fL (ref 80.0–100.0)
Platelets: 385 10*3/uL (ref 150–400)
RBC: 3.51 MIL/uL — ABNORMAL LOW (ref 4.22–5.81)
RDW: 15.8 % — ABNORMAL HIGH (ref 11.5–15.5)
WBC: 12.9 10*3/uL — ABNORMAL HIGH (ref 4.0–10.5)
nRBC: 0 % (ref 0.0–0.2)

## 2019-12-06 LAB — POCT I-STAT 7, (LYTES, BLD GAS, ICA,H+H)
Acid-Base Excess: 8 mmol/L — ABNORMAL HIGH (ref 0.0–2.0)
Bicarbonate: 33.8 mmol/L — ABNORMAL HIGH (ref 20.0–28.0)
Calcium, Ion: 1.19 mmol/L (ref 1.15–1.40)
HCT: 26 % — ABNORMAL LOW (ref 39.0–52.0)
Hemoglobin: 8.8 g/dL — ABNORMAL LOW (ref 13.0–17.0)
O2 Saturation: 100 %
Potassium: 4.1 mmol/L (ref 3.5–5.1)
Sodium: 131 mmol/L — ABNORMAL LOW (ref 135–145)
TCO2: 35 mmol/L — ABNORMAL HIGH (ref 22–32)
pCO2 arterial: 51 mmHg — ABNORMAL HIGH (ref 32.0–48.0)
pH, Arterial: 7.429 (ref 7.350–7.450)
pO2, Arterial: 305 mmHg — ABNORMAL HIGH (ref 83.0–108.0)

## 2019-12-06 LAB — POCT I-STAT, CHEM 8
BUN: 9 mg/dL (ref 6–20)
Calcium, Ion: 1.17 mmol/L (ref 1.15–1.40)
Chloride: 89 mmol/L — ABNORMAL LOW (ref 98–111)
Creatinine, Ser: 0.6 mg/dL — ABNORMAL LOW (ref 0.61–1.24)
Glucose, Bld: 129 mg/dL — ABNORMAL HIGH (ref 70–99)
HCT: 28 % — ABNORMAL LOW (ref 39.0–52.0)
Hemoglobin: 9.5 g/dL — ABNORMAL LOW (ref 13.0–17.0)
Potassium: 4 mmol/L (ref 3.5–5.1)
Sodium: 132 mmol/L — ABNORMAL LOW (ref 135–145)
TCO2: 33 mmol/L — ABNORMAL HIGH (ref 22–32)

## 2019-12-06 LAB — BASIC METABOLIC PANEL
Anion gap: 8 (ref 5–15)
BUN: 10 mg/dL (ref 6–20)
CO2: 31 mmol/L (ref 22–32)
Calcium: 8.2 mg/dL — ABNORMAL LOW (ref 8.9–10.3)
Chloride: 95 mmol/L — ABNORMAL LOW (ref 98–111)
Creatinine, Ser: 0.69 mg/dL (ref 0.61–1.24)
GFR calc Af Amer: >60 mL/min (ref >60)
GFR calc non Af Amer: >60 mL/min (ref >60)
Glucose, Bld: 188 mg/dL — ABNORMAL HIGH (ref 70–99)
Potassium: 4 mmol/L (ref 3.5–5.1)
Sodium: 134 mmol/L — ABNORMAL LOW (ref 135–145)

## 2019-12-06 LAB — COOXEMETRY PANEL
Carboxyhemoglobin: 3.3 % — ABNORMAL HIGH (ref 0.5–1.5)
Methemoglobin: 1.1 % (ref 0.0–1.5)
O2 Saturation: 65 %
Total hemoglobin: 10.6 g/dL — ABNORMAL LOW (ref 12.0–16.0)

## 2019-12-06 LAB — POCT ACTIVATED CLOTTING TIME: Activated Clotting Time: 125 seconds

## 2019-12-06 LAB — MAGNESIUM: Magnesium: 1.9 mg/dL (ref 1.7–2.4)

## 2019-12-06 MED ORDER — MAGNESIUM SULFATE IN D5W 1-5 GM/100ML-% IV SOLN
1.0000 g | Freq: Once | INTRAVENOUS | Status: AC
  Administered 2019-12-06: 09:00:00 1 g via INTRAVENOUS
  Filled 2019-12-06: qty 100

## 2019-12-06 MED ORDER — CARVEDILOL 6.25 MG PO TABS
6.2500 mg | ORAL_TABLET | Freq: Two times a day (BID) | ORAL | Status: DC
  Administered 2019-12-06 – 2019-12-08 (×4): 6.25 mg via ORAL
  Filled 2019-12-06 (×4): qty 1

## 2019-12-06 MED ORDER — COLCHICINE 0.6 MG PO TABS
0.6000 mg | ORAL_TABLET | Freq: Every day | ORAL | Status: DC
  Administered 2019-12-07 – 2019-12-08 (×2): 0.6 mg via ORAL
  Filled 2019-12-06 (×2): qty 1

## 2019-12-06 MED ORDER — DIGOXIN 125 MCG PO TABS
0.1250 mg | ORAL_TABLET | Freq: Every day | ORAL | Status: DC
  Administered 2019-12-06 – 2019-12-08 (×3): 0.125 mg via ORAL
  Filled 2019-12-06 (×3): qty 1

## 2019-12-06 MED ORDER — TRAZODONE HCL 50 MG PO TABS
50.0000 mg | ORAL_TABLET | Freq: Every day | ORAL | Status: DC
  Administered 2019-12-06 – 2019-12-07 (×2): 50 mg via ORAL
  Filled 2019-12-06 (×2): qty 1

## 2019-12-06 MED ORDER — MILRINONE LACTATE IN DEXTROSE 20-5 MG/100ML-% IV SOLN
0.1250 ug/kg/min | INTRAVENOUS | Status: DC
  Administered 2019-12-06: 0.125 ug/kg/min via INTRAVENOUS
  Filled 2019-12-06: qty 100

## 2019-12-06 MED ORDER — TRAZODONE HCL 50 MG PO TABS: 50.0000 mg | ORAL_TABLET | Freq: Every evening | ORAL | Status: DC | PRN

## 2019-12-06 MED ORDER — LOSARTAN POTASSIUM 25 MG PO TABS
12.5000 mg | ORAL_TABLET | Freq: Two times a day (BID) | ORAL | Status: DC
  Administered 2019-12-06 (×2): 12.5 mg via ORAL
  Filled 2019-12-06 (×2): qty 1

## 2019-12-06 NOTE — Progress Notes (Signed)
Patient ID: Kevin Underwood, male   DOB: [birth year], 58 y.o.   MRN: [id number]     Advanced Heart Failure Rounding Note  PCP-Cardiologist: Kevin Dell, MD   Subjective:    - Impella 5.5 placed 12/16 due to cardiogenic shock.  - CABG x 4 + Maze 12/18 - Impella removed 12/26  Walking up and down hallway, steady per nurse.  Wants to go home.  Not sleeping much.   On milrinone 0.25 mcg/kg/min. Co-ox 65%. Weight down again on po Lasix, getting near baseline. CVP 8-9 today.    Objective:   Weight Range: 82.7 kg Body mass index is 26.16 kg/m.   Vital Signs:   Temp:  [97.1 F (36.2 C)-98.4 F (36.9 C)] 97.1 F (36.2 C) (12/28 0759) Resp:  [15-24] 21 (12/28 0000) BP: (109-112)/(62-70) 112/70 (12/27 1400) SpO2:  [79 %-100 %] 97 % (12/27 2050) Arterial Line BP: (80-131)/(40-52) 99/46 (12/28 0400) Weight:  [82.7 kg] 82.7 kg (12/28 0500) Last BM Date: 12/06/19  Weight change: Filed Weights   12/04/19 0329 12/05/19 0500 12/06/19 0500  Weight: 89.5 kg 87.5 kg 82.7 kg    Intake/Output:   Intake/Output Summary (Last 24 hours) at 12/06/2019 0835 Last data filed at 12/06/2019 0804 Gross per 24 hour  Intake 526.02 ml  Output 4365 ml  Net -3838.98 ml      Physical Exam   General: NAD Neck: JVP 8 cm, no thyromegaly or thyroid nodule.  Lungs: Clear to auscultation bilaterally with normal respiratory effort. CV: Nondisplaced PMI.  Heart regular S1/S2, no S3/S4, no murmur.  Trace ankle edema.   Abdomen: Soft, nontender, no hepatosplenomegaly, no distention.  Skin: Intact without lesions or rashes.  Neurologic: Alert and oriented x 3.  Psych: Normal affect. Extremities: No clubbing or cyanosis.  HEENT: Normal.    Telemetry   NSR 90s, personally reviewed.   Labs    CBC Recent Labs    12/04/19 0317 12/06/19 0449  WBC 9.3 12.9*  HGB 9.8* 10.2*  HCT 29.7* 31.7*  MCV 87.9 90.3  PLT 227 385   Basic Metabolic Panel Recent Labs    72/53/66 0432 12/06/19 0449    NA 133* 134*  K 4.3 4.0  CL 98 95*  CO2 30 31  GLUCOSE 201* 188*  BUN 11 10  CREATININE 0.68 0.69  CALCIUM 7.7* 8.2*  MG 1.7 1.9   Liver Function Tests No results for input(s): AST, ALT, ALKPHOS, BILITOT, PROT, ALBUMIN in the last 72 hours. No results for input(s): LIPASE, AMYLASE in the last 72 hours. Cardiac Enzymes No results for input(s): CKTOTAL, CKMB, CKMBINDEX, TROPONINI in the last 72 hours.  BNP: BNP (last 3 results) Recent Labs    11/22/19 0414 11/23/19 0453 11/25/19 0500  BNP 578.0* 1,054.0* 596.1*    ProBNP (last 3 results) No results for input(s): PROBNP in the last 8760 hours.   D-Dimer No results for input(s): DDIMER in the last 72 hours. Hemoglobin A1C No results for input(s): HGBA1C in the last 72 hours. Fasting Lipid Panel No results for input(s): CHOL, HDL, LDLCALC, TRIG, CHOLHDL, LDLDIRECT in the last 72 hours. Thyroid Function Tests No results for input(s): TSH, T4TOTAL, T3FREE, THYROIDAB in the last 72 hours.  Invalid input(s): FREET3  Other results:   Imaging    DG Chest 1 View  Result Date: 12/06/2019 CLINICAL DATA:  Chest soreness today, status post open heart surgery. EXAM: CHEST  1 VIEW COMPARISON:  Chest radiograph 12/04/2019 FINDINGS: There has been interval removal of a  previously demonstrated chest tube as well as previous left IJ approach introducer sheath and Swan-Ganz catheter. Unchanged position of right-sided PICC with tip terminating in the region of the cavoatrial junction. As before, a surgical drain is looped in the right subclavian region, at the site of surgical clips. Status post median sternotomy/CABG. The cardiomediastinal silhouette is unchanged. Aortic atherosclerosis. Redemonstrated left atrial appendage clip. Similar lung volumes with persistent retrocardiac collapse/consolidation with small effusion. Small right hydropneumothorax. Unchanged small probable focal fluid collection in the lateral pleura of the mid right  chest, site of prior chest tube placement. Redemonstrated diffusely prominent interstitial lung markings. IMPRESSION: Small right hydropneumothorax. Similar retrocardiac left base collapse/consolidation with effusion. Support lines/tubes as described. Electronically Signed   By: Jackey LogeKyle  Golden DO   On: 12/06/2019 07:23   ECHOCARDIOGRAM LIMITED  Result Date: 12/05/2019   ECHOCARDIOGRAM LIMITED REPORT   Patient Name:   Kevin Underwood Date of Exam: 12/05/2019 Medical Rec #:  [id number]        Height:       70.0 in Accession #:    [id number]       Weight:       192.9 lb Date of Birth:  22-Jun-1961        BSA:          2.06 m Patient Age:    58 years         BP:           110/65 mmHg Patient Gender: M                HR:           91 bpm. Exam Location:  Inpatient  Procedure: Limited Echo, Limited Color Doppler and Cardiac Doppler Indications:    CHF-Acute Systolic; Assess EF and MR after Impella removal.  History:        Patient has prior history of Echocardiogram examinations, most                 recent 12/05/2019.  Sonographer:    Thurman Coyerasey Kirkpatrick RDCS (AE) Referring Phys: [id number] Kevin Underwood  Sonographer Comments: No subcostal window. IMPRESSIONS  1. Left ventricular ejection fraction, by visual estimation, is 20 to 25%. The left ventricle has severely decreased function. There is no increased left ventricular wall thickness.  2. Abnormal septal motion consistent with post-operative status.  3. Elevated left atrial pressure.  4. Left ventricular diastolic parameters are consistent with Grade II diastolic dysfunction (pseudonormalization).  5. Moderately dilated left ventricular internal cavity size.  6. The left ventricle demonstrates global hypokinesis.  7. Global right ventricle has moderately reduced systolic function.The right ventricular size is moderately enlarged. no increase in right ventricular wall thickness.  8. Left atrial size was moderately dilated.  9. The mitral valve is normal in structure.  There is mild to moderate mitral valve regurgitation. No evidence of mitral stenosis. 10. The tricuspid valve was normal in structure. Tricuspid valve regurgitation is not demonstrated. 11. Tricuspid valve regurgitation is not demonstrated. 12. No evidence of aortic valve sclerosis or stenosis. 13. The pulmonic valve was normal in structure. 14. Mildly elevated pulmonary artery systolic pressure. 15. The tricuspid regurgitant velocity is 2.67 m/s, and with an assumed right atrial pressure of 3 mmHg, the estimated right ventricular systolic pressure is mildly elevated at 31.5 mmHg. (may be higher; inferior vena cava could not be visualized). FINDINGS  Left Ventricle: Left ventricular ejection fraction, by visual estimation, is 20 to 25%. The left  ventricle has severely decreased function. The left ventricle demonstrates global hypokinesis. The left ventricular internal cavity size was moderately dilated left ventricle. There is no increased left ventricular wall thickness. Abnormal (paradoxical) septal motion consistent with post-operative status. Left ventricular diastolic parameters are consistent with Grade II diastolic dysfunction (pseudonormalization). Elevated left atrial pressure. Right Ventricle: The right ventricular size is moderately enlarged. No increase in right ventricular wall thickness. Global RV systolic function is has moderately reduced systolic function. The tricuspid regurgitant velocity is 2.67 m/s, and with an assumed right atrial pressure of 3 mmHg, the estimated right ventricular systolic pressure is mildly elevated at 31.5 mmHg. Left Atrium: Left atrial size was moderately dilated. Right Atrium: Right atrial size was normal in size. Right atrial pressure is estimated at 3 mmHg. Pericardium: There is no evidence of pericardial effusion is seen. There is no evidence of pericardial effusion. Mitral Valve: The mitral valve is normal in structure. No evidence of mitral valve stenosis by  observation. MV Area by PHT, 3.75 cm. MV PHT, 58.67 msec. Mild to moderate mitral valve regurgitation, with centrally-directed jet. Tricuspid Valve: The tricuspid valve is normal in structure. Tricuspid valve regurgitation is not demonstrated. Aortic Valve: The aortic valve is normal in structure. Aortic valve regurgitation is not visualized. The aortic valve is structurally normal, with no evidence of sclerosis or stenosis. Pulmonic Valve: The pulmonic valve was normal in structure. Pulmonic valve regurgitation is trivial by color flow Doppler. Pulmonic regurgitation is trivial by color flow Doppler. Aorta: The aortic root, ascending aorta and aortic arch are all structurally normal, with no evidence of dilitation or obstruction. Venous: The inferior vena cava was not well visualized. Shunts: There is no evidence of a patent foramen ovale. No ventricular septal defect is seen or detected. There is no evidence of an atrial septal defect. No atrial level shunt detected by color flow Doppler.  LEFT VENTRICLE         Normals PLAX 2D LVIDd:         6.10 cm 3.6 cm   Diastology                 Normals LVIDs:         5.10 cm 1.7 cm   LV e' lateral:   8.70 cm/s 6.42 cm/s LV PW:         1.00 cm 1.4 cm   LV E/e' lateral: 9.7       15.4 LV IVS:        1.20 cm 1.3 cm   LV e' medial:    6.20 cm/s 6.96 cm/s LV SV:         63 ml   79 ml    LV E/e' medial:  13.6      6.96 LV SV Index:   30.12   45 ml/m2  LEFT ATRIUM         Index LA diam:    4.80 cm 2.34 cm/m   AORTA                 Normals Ao Root diam: 3.90 cm 31 mm MITRAL VALVE              Normals   TRICUSPID VALVE             Normals MV Area (PHT): 3.75 cm             TR Peak grad:   28.5 mmHg MV PHT:        58.67  msec 55 ms     TR Vmax:        267.00 cm/s 288 cm/s MV Decel Time: 202 msec   187 ms MR Peak grad:    64.3 mmHg MR Mean grad:    40.0 mmHg MR Vmax:         401.00 cm/s MR Vmean:        293.0 cm/s MR PISA:         2.26 cm MR PISA Eff ROA: 21 mm MR PISA Radius:   0.60 cm MV E velocity: 84.57 cm/s 103 cm/s MV A velocity: 38.56 cm/s 70.3 cm/s MV E/A ratio:  2.19       1.5  Mihai Croitoru MD Electronically signed by Thurmon Fair MD Signature Date/Time: 12/05/2019/2:47:56 PMThe mitral valve is normal in structure.    Final      Medications:     Scheduled Medications:  amiodarone  400 mg Oral Daily   arformoterol  15 mcg Nebulization BID   aspirin  81 mg Oral Daily   budesonide (PULMICORT) nebulizer solution  0.5 mg Nebulization BID   carvedilol  3.125 mg Oral BID WC   Chlorhexidine Gluconate Cloth  6 each Topical Daily   clopidogrel  75 mg Oral Daily   colchicine  0.6 mg Oral BID   enoxaparin (LOVENOX) injection  40 mg Subcutaneous Q24H   feeding supplement (ENSURE ENLIVE)  237 mL Oral TID BM   furosemide  80 mg Oral Daily   insulin aspart  0-20 Units Subcutaneous Q4H   ipratropium  0.5 mg Nebulization BID   losartan  12.5 mg Oral BID   magnesium oxide  400 mg Oral BID   mouth rinse  15 mL Mouth Rinse BID   multivitamin with minerals  1 tablet Oral Daily   pantoprazole (PROTONIX) IV  40 mg Intravenous QHS   potassium chloride  40 mEq Oral BID   rosuvastatin  40 mg Oral q1800   senna-docusate  1 tablet Oral Daily   sodium chloride flush  10-40 mL Intracatheter Q12H   sodium chloride flush  3 mL Intravenous Q12H   spironolactone  25 mg Oral Daily    Infusions:  magnesium sulfate bolus IVPB     milrinone 0.25 mcg/kg/min (12/05/19 2144)    PRN Medications: bisacodyl, levalbuterol, metoprolol tartrate, ondansetron (ZOFRAN) IV, oxyCODONE, sodium chloride flush, sodium chloride flush, sorbitol, traMADol    Assessment/Plan   1. Acute systolic CHF => cardiogenic shock:  Echo showed EF < 20% with mildly decreased RV systolic function. He was admitted with afib/RVR and CHF.  Cath with elevated filing pressures, low CO (CI 1.4 by RHC), and severe 3 vessel coronary disease.  Ischemic cardiomyopathy, possibly  contribution from tachycardia with previous atrial fibrillation. Impella 5.5 placed due to cardiogenic shock on 12/16.  CABG on 12/18 with Maze. Impella removed 12/26. Echo (12/27) with EF 20-25%, moderately decreased RV systolic function, mild-moderate MR, PASP 31.5 mmHg. Off epinephrine.  Remains on milrinone 0.25 mcg/kg/min.  Co-ox 65%, CVP 8-9.  Now on po Lasix.  - Decrease milrinone to 0.125 today.  - Continue Lasix 80 po daily - Continue spiro 25mg  daily - On carvedilol 3.125 bid. Will continue  - Start losartan 12.5 mg bid, transition to Entresto if stable on this.  - Add digoxin 0.125 daily.  2. Atrial fibrillation: With RVR at admission, back in NSR now.  Maze with surgery.  Remains in NSR. Heparin off with recent bleeding. LAA clipped.  - Continue lovenox  for DVT prophylaxis.  - Eliquis if atrial fibrillation recurs. - Continue amio 400 mg daily.  3. CAD: Severe 3VD in diabetic smoker.  He has exertional chest pain at baseline.  Was not admitted with ACS. Now s/p CABG.  - Continue ASA 81 daily.  - rosuvastatin 40 daily.    4. ABLA from impella site: Hgb now stable and off heparin gtt.  5. 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  Suspect group 2 + group 3 (COPD).  PA pressures have come down with diuresis. Echo 12/27 with PASP 31.5 mmHg.  6. Diabetes: Poor control.  Per IM, would eventually benefit from SGLT2 inhibitor.  7. OSA: Strongly suspected.  8. COPD: Suspected, former smoker.  Emphysema seen on CT chest.  9. ID: Afebrile  10. PVCs/NSVT - continue amio - Keep mg > 2.0 K > 4.0  Can go to step down.   CRITICAL CARE Performed by: Marca Anconaalton Kline Bulthuis  Total critical care time: 35 minutes  Critical care time was exclusive of separately billable procedures and treating other patients.  Critical care was necessary to treat or prevent imminent or life-threatening deterioration.  Critical care was time spent personally by me (independent of midlevel  providers or residents) on the following activities: development of treatment plan with patient and/or surrogate as well as nursing, discussions with consultants, evaluation of patient's response to treatment, examination of patient, obtaining history from patient or surrogate, ordering and performing treatments and interventions, ordering and review of laboratory studies, ordering and review of radiographic studies, pulse oximetry and re-evaluation of patient's condition.    Length of Stay: 4816  Marca Anconaalton Hollye Pritt, MD  12/06/2019, 8:35 AM  Advanced Heart Failure Team Pager [phone] (M-F; 7a - 4p)  Please contact [facility] for night-coverage after hours (4p -7a ) and weekends on [email or web address]

## 2019-12-06 NOTE — Progress Notes (Signed)
CARDIAC REHAB PHASE I   Offered to walk with pt. Pt recently finished a bath, and walked with PT. Will f/u later to walk with pt.  Rufina Falco, RN BSN 12/06/2019 10:29 AM

## 2019-12-06 NOTE — Evaluation (Signed)
Occupational Therapy Evaluation Patient Details Name: Kevin Underwood MRN: [id number] DOB: March 19, 1961 Today's Date: 12/06/2019    History of Present Illness 58 yo man referred for consideration of CABG. He was in [place] until last week when he presented with SOB and found to be in Afib which lead to heart failure sx. He was transferred to [facility] and underwent LHC today demonstrating severe 3V CAD and severely depressed LV function. Pt underwent impella 5.5 placement on 12/16 and CABG x4 on 12/18. Impella removed on 12/26.   Clinical Impression   This 58 y/o male presents with the above. PTA pt reports independence with ADL and mobility. Pt presents seated in recliner pleasant and willing to participate in therapy session. Pt requiring min-modA for functional transfers via RW, pt requiring increased assist to standing this session, pt reporting due to fatigue and chronic back pain. Overall requiring minA for seated UB ADL, modA for LB ADL and min cues for carry over of sternal precautions during functional/mobility tasks, VSS throughout. Pt reports lives with spouse who can assist with ADL/iADL PRN after discharge. He will benefit from continued acute OT services and recommend follow up [facility] services after discharge to maximize his safety and independence with ADL and mobility. Will follow.    Follow Up Recommendations  Home health OT;Supervision/Assistance - 24 hour    Equipment Recommendations  None recommended by OT           Precautions / Restrictions Precautions Precautions: Sternal;Fall Precaution Comments: verbally reviewed sternal prcautions, cued during session Restrictions Weight Bearing Restrictions: No Other Position/Activity Restrictions: sternal precautions      Mobility Bed Mobility               General bed mobility comments: OOB in recliner upon arrival  Transfers Overall transfer level: Needs assistance Equipment used: Rolling walker (2 wheeled) Transfers:  Sit to/from Stand Sit to Stand: Min assist;Mod assist         General transfer comment: pt requiring increased boosting assist to rise to standing this session, VCs for adherence to sternal precautions, pt reports increased difficulty due to fatigue, back pain; once in standing pt able to maintain static balance with mingaurd assist using RW    Balance Overall balance assessment: Needs assistance Sitting-balance support: No upper extremity supported;Feet supported Sitting balance-Leahy Scale: Good Sitting balance - Comments: supervision   Standing balance support: No upper extremity supported;Bilateral upper extremity supported Standing balance-Leahy Scale: Fair                             ADL either performed or assessed with clinical judgement   ADL Overall ADL's : Needs assistance/impaired Eating/Feeding: Modified independent;Sitting Eating/Feeding Details (indicate cue type and reason): pt finishing lunch upon arrival to room Grooming: Set up;Min guard;Sitting   Upper Body Bathing: Min guard;Sitting   Lower Body Bathing: Moderate assistance;Sit to/from stand   Upper Body Dressing : Minimal assistance;Sitting;Cueing for compensatory techniques Upper Body Dressing Details (indicate cue type and reason): donning new gown, cues for sternal precautions Lower Body Dressing: Moderate assistance;Sit to/from stand   Toilet Transfer: Minimal assistance;Stand-pivot;RW   Toileting- Clothing Manipulation and Hygiene: Minimal assistance;Moderate assistance;Sit to/from stand       Functional mobility during ADLs: Minimal assistance;Moderate assistance;Rolling walker General ADL Comments: pt with fatigue, general weakness; requires min cues for maintaining sternal precautions during session  Pertinent Vitals/Pain Pain Assessment: Faces Faces Pain Scale: Hurts even more Pain Location: back (reports chronic back pain) Pain Descriptors /  Indicators: Discomfort;Guarding;Sore Pain Intervention(s): Limited activity within patient's tolerance;Monitored during session;Repositioned     Hand Dominance Right   Extremity/Trunk Assessment Upper Extremity Assessment Upper Extremity Assessment: Generalized weakness   Lower Extremity Assessment Lower Extremity Assessment: Defer to PT evaluation   Cervical / Trunk Assessment Cervical / Trunk Assessment: Kyphotic   Communication Communication Communication: No difficulties   Cognition Arousal/Alertness: Awake/alert Behavior During Therapy: WFL for tasks assessed/performed Overall Cognitive Status: Within Functional Limits for tasks assessed                                     General Comments  VSS    Exercises     Shoulder Instructions      Home Living Family/patient expects to be discharged to:: Private residence Living Arrangements: Spouse/significant other;Parent Available Help at Discharge: Family;Available 24 hours/day Type of Home: House Home Access: Stairs to enter;Ramped entrance Entrance Stairs-Number of Steps: 3 steps and has a ramp   Home Layout: One level     Bathroom Shower/Tub: Tub/shower unit;Walk-in shower   Bathroom Toilet: Handicapped height Bathroom Accessibility: Yes   Home Equipment: Walker - 2 wheels;Shower seat          Prior Functioning/Environment Level of Independence: Independent                 OT Problem List: Decreased strength;Decreased range of motion;Decreased activity tolerance;Impaired balance (sitting and/or standing);Decreased safety awareness;Decreased knowledge of precautions;Cardiopulmonary status limiting activity;Pain      OT Treatment/Interventions: Self-care/ADL training;Therapeutic exercise;Energy conservation;DME and/or AE instruction;Therapeutic activities;Patient/family education;Balance training    OT Goals(Current goals can be found in the care plan section) Acute Rehab OT  Goals Patient Stated Goal: To return to independent mobility OT Goal Formulation: With patient Time For Goal Achievement: 12/20/19 Potential to Achieve Goals: Good  OT Frequency: Min 2X/week   Barriers to D/C:            Co-evaluation              AM-PAC OT "6 Clicks" Daily Activity     Outcome Measure Help from another person eating meals?: None Help from another person taking care of personal grooming?: A Little Help from another person toileting, which includes using toliet, bedpan, or urinal?: A Lot Help from another person bathing (including washing, rinsing, drying)?: A Lot Help from another person to put on and taking off regular upper body clothing?: A Little Help from another person to put on and taking off regular lower body clothing?: A Lot 6 Click Score: 16   End of Session Equipment Utilized During Treatment: Rolling walker Nurse Communication: Mobility status  Activity Tolerance: Patient tolerated treatment well;Patient limited by fatigue Patient left: in chair;with call bell/phone within reach;with family/visitor present  OT Visit Diagnosis: Muscle weakness (generalized) (M62.81);Other abnormalities of gait and mobility (R26.89);Pain Pain - part of body: (back)                Time: 1130-1150 OT Time Calculation (min): 20 min Charges:  OT General Charges $OT Visit: 1 Visit OT Evaluation $OT Eval Moderate Complexity: 1 Mod  Marcy Siren, OT [employer] Pager [phone] Office [phone]  Orlando Penner 12/06/2019, 1:43 PM

## 2019-12-06 NOTE — Progress Notes (Signed)
Inpatient Diabetes Program Recommendations  AACE/ADA: New Consensus Statement on Inpatient Glycemic Control (2015)  Target Ranges:  Prepandial:   less than 140 mg/dL      Peak postprandial:   less than 180 mg/dL (1-2 hours)      Critically ill patients:  140 - 180 mg/dL   Lab Results  Component Value Date   GLUCAP 192 (H) 12/06/2019   HGBA1C 12.1 (H) 11/20/2019    Review of Glycemic Control  Diabetes history: DM2  Outpatient Diabetes medications: Amaryl 2 mg daily; Invokamet XR [phone] MG TB24 (2 tabs daily); Invokana 100 mg daily; Glucophage 1000 mg twice daily; Victoza 1.2 units twice daily Current orders for Inpatient glycemic control: Novolog 0-20 units every 4 hours  Inpatient Diabetes Program Recommendations:     Note:  Patient is eating  -Please consider Novolog 0-20 units TID with meals and 0-5 HS -Please consider a carb modified diet  Spoke with patient and spouse at bedside this afternoon.  Reviewed patient's current A1c of 12.1% (300mg /dl average over the last 2-3 months).  Explained what a A1c is and what it measures. Also reviewed goal A1c with patient, importance of good glucose control @ home, and blood sugar goals.  Patient states he has been eating a lot of bread lately.  He recently quit smoking and has also been eating a lot of candy to help with his cravings.  Reviewed long term risks of elevated blood sugar.  He sees Dr. Cyndi Bender, PCP for his DM management.  He states he saw him last 1 month ago and sees him every 3 months.  No difficulties obtaining medications and takes his medications as prescribed.  He states he does not want to be in this situation again and will start eating better.  Review healthy food choices and foods that contain carbohydrates.    Patient has been prescribed insulin in the past.  He states he does not need any teaching on the insulin pen which is what he prefers.  He does not object to discharging on insulin and states his PCP has discussed  the possibility of going back on insulin.   Reviewed hypoglycemia and treatment.  Will continue to follow.    Thank you, Geoffry Paradise, RN, BSN Diabetes Coordinator Inpatient Diabetes Program [phone] (team pager from 8a-5p)

## 2019-12-06 NOTE — Progress Notes (Signed)
CARDIAC REHAB PHASE I   PRE:  Rate/Rhythm: 87 SR  BP:  Sitting: 119/67      SaO2: 96 RA  MODE:  Ambulation: 370 ft   POST:  Rate/Rhythm: 99 SR  BP:  Sitting: 135/74    SaO2: 95 RA  Pt willing to ambulated. Pt able to walk 349ft in hallway assist of one with several short standing rest breaks. Pts main complaint is SOB, does states some fatigue at end of walk. Plans to walk with front wheel walker in the future to help increase distance and decrease fatigue.  Encouraged continued IS use. Pt to transfer to stepdown shortly. Pt requesting walker for home use. Will continue to follow.  Dobson, RN BSN 12/06/2019 2:20 PM

## 2019-12-06 NOTE — Plan of Care (Signed)

## 2019-12-06 NOTE — Progress Notes (Signed)
2 Days Post-Op Procedure(s) (LRB): REMOVAL OF IMPELLA LEFT VENTRICULAR ASSIST DEVICE, right axilla (Right) Intraoperative Transesophageal Echocardiogram Subjective: No complaints  Objective: Vital signs in last 24 hours: Temp:  [97.1 F (36.2 C)-98.4 F (36.9 C)] 97.1 F (36.2 C) (12/28 0759) Pulse Rate:  [91-92] 92 (12/28 0900) Cardiac Rhythm: Normal sinus rhythm (12/28 0400) Resp:  [15-24] 22 (12/28 0841) BP: (109-117)/(62-71) 117/71 (12/28 0841) SpO2:  [79 %-100 %] 93 % (12/28 0841) Arterial Line BP: (80-131)/(40-52) 99/46 (12/28 0400) Weight:  [82.7 kg] 82.7 kg (12/28 0500)  Hemodynamic parameters for last 24 hours: CVP:  [6 mmHg-10 mmHg] 6 mmHg  Intake/Output from previous day: 12/27 0701 - 12/28 0700 In: 526 [P.O.:240; I.V.:188.3; IV Piggyback:97.8] Out: 4270 [Urine:3900; Drains:5; Chest Tube:60] Intake/Output this shift: Total I/O In: -  Out: 400 [Urine:400]  General appearance: alert and cooperative Neurologic: intact Heart: regular rate and rhythm, S1, S2 normal, no murmur, click, rub or gallop Lungs: clear to auscultation bilaterally Abdomen: soft, non-tender; bowel sounds normal; no masses,  no organomegaly Extremities: extremities normal, atraumatic, no cyanosis or edema Wound: c/d/i  Lab Results: Recent Labs    12/04/19 0317 12/06/19 0449  WBC 9.3 12.9*  HGB 9.8* 10.2*  HCT 29.7* 31.7*  PLT 227 385   BMET:  Recent Labs    12/05/19 0432 12/06/19 0449  NA 133* 134*  K 4.3 4.0  CL 98 95*  CO2 30 31  GLUCOSE 201* 188*  BUN 11 10  CREATININE 0.68 0.69  CALCIUM 7.7* 8.2*    PT/INR: No results for input(s): LABPROT, INR in the last 72 hours. ABG    Component Value Date/Time   PHART 7.446 12/02/2019 1625   HCO3 28.5 (H) 12/02/2019 1625   TCO2 30 12/02/2019 1625   ACIDBASEDEF 6.0 (H) 11/24/2019 1718   O2SAT 65.0 12/06/2019 0450   CBG (last 3)  Recent Labs    12/05/19 2330 12/06/19 0343 12/06/19 0801  GLUCAP 131* 160* 197*     Assessment/Plan: S/P Procedure(s) (LRB): REMOVAL OF IMPELLA LEFT VENTRICULAR ASSIST DEVICE, right axilla (Right) Intraoperative Transesophageal Echocardiogram Mobilize Plan for transfer to step-down: see transfer orders See progression orders   LOS: 16 days    Wonda Olds 12/06/2019

## 2019-12-06 NOTE — Progress Notes (Signed)
Pt mother called unit, spoke to RN regarding visitation. Mother states she is physically unable to return to hospital to visit son. Pt and mother requesting that wife be allowed to visit. RN spoke with pt regarding previous request for wife to not be allowed any further visitation. Pt stated he would like his wife to be allowed back. Due to mother's inability to visit, wife placed back as designated visitor. Pt, wife and mother all made aware that there will be no further changes made to visitation.

## 2019-12-06 NOTE — Progress Notes (Signed)
Physical Therapy Treatment Patient Details Name: Kevin Underwood MRN: [id number] DOB: [birth year] Today's Date: 12/06/2019    History of Present Illness 58 yo man referred for consideration of CABG. He was in USOH until last week when he presented with SOB and found to be in Afib which lead to heart failure sx. He was transferred to [facility] and underwent LHC today demonstrating severe 3V CAD and severely depressed LV function. Pt underwent impella 5.5 placement on 12/16 and CABG x4 on 12/18. Impella removed on 12/26.    PT Comments    Pt tolerated treatment well, demonstrating significant improvement in activity tolerance, gait, and balance. Pt is able to ambulate community distances with RW, and tolerate gait training without the use of an assistive device. Pt will benefit from continued ambulation without the use of UE support to challenge balance and improve gait quality. Pt is encouraged to continue ambulation at least 3 times per day for community distances. PT updating recommendations to discharge home with HHPT and assistance from family. Pt may need RW pending progress with mobility.   Follow Up Recommendations  Home health PT;Supervision/Assistance - 24 hour     Equipment Recommendations  Rolling walker with 5" wheels(pt may own one, checking with mother)    Recommendations for Other Services       Precautions / Restrictions Precautions Precautions: Sternal;Fall Restrictions Weight Bearing Restrictions: No Other Position/Activity Restrictions: sternal precautions    Mobility  Bed Mobility Overal bed mobility: Needs Assistance Bed Mobility: Supine to Sit     Supine to sit: Supervision     General bed mobility comments: pt requires verbal cues to reinforce sternal precautions, attempting to push during bed mobility  Transfers Overall transfer level: Needs assistance Equipment used: Rolling walker (2 wheeled) Transfers: Sit to/from Stand Sit to Stand: Supervision          General transfer comment: verbal cues for sternal precautions  Ambulation/Gait Ambulation/Gait assistance: Supervision Gait Distance (Feet): 400 Feet(300' with RW, 100' without device) Assistive device: Rolling walker (2 wheeled) Gait Pattern/deviations: Step-through pattern;Drifts right/left Gait velocity: functional Gait velocity interpretation: 1.31 - 2.62 ft/sec, indicative of limited community ambulator General Gait Details: pt with steady step through gait with use of RW, requiring one standing rest break due to fatigue. Last 100' without device, drifting minimally left/right with some increased lateral sway noted.   Stairs             Wheelchair Mobility    Modified Rankin (Stroke Patients Only)       Balance Overall balance assessment: Needs assistance Sitting-balance support: No upper extremity supported;Feet supported Sitting balance-Leahy Scale: Good Sitting balance - Comments: supervision   Standing balance support: No upper extremity supported Standing balance-Leahy Scale: Good Standing balance comment: close supervision without use of device                            Cognition Arousal/Alertness: Awake/alert Behavior During Therapy: WFL for tasks assessed/performed Overall Cognitive Status: Within Functional Limits for tasks assessed                                        Exercises      General Comments General comments (skin integrity, edema, etc.): VSS on RA      Pertinent Vitals/Pain Pain Assessment: No/denies pain    Home Living  Prior Function            PT Goals (current goals can now be found in the care plan section) Acute Rehab PT Goals Patient Stated Goal: To return to independent mobility Progress towards PT goals: Progressing toward goals    Frequency    Min 3X/week      PT Plan Discharge plan needs to be updated    Co-evaluation               AM-PAC PT "6 Clicks" Mobility   Outcome Measure  Help needed turning from your back to your side while in a flat bed without using bedrails?: None Help needed moving from lying on your back to sitting on the side of a flat bed without using bedrails?: None Help needed moving to and from a bed to a chair (including a wheelchair)?: None Help needed standing up from a chair using your arms (e.g., wheelchair or bedside chair)?: None Help needed to walk in hospital room?: None Help needed climbing 3-5 steps with a railing? : A Lot 6 Click Score: 22    End of Session Equipment Utilized During Treatment: (none) Activity Tolerance: Patient tolerated treatment well Patient left: in chair;with call bell/phone within reach;with nursing/sitter in room Nurse Communication: Mobility status PT Visit Diagnosis: Unsteadiness on feet (R26.81)     Time: 6962-9528 PT Time Calculation (min) (ACUTE ONLY): 15 min  Charges:  $Gait Training: 8-22 mins                     Zenaida Niece, PT, DPT Acute Rehabilitation Pager: [phone]    Zenaida Niece 12/06/2019, 10:28 AM

## 2019-12-06 NOTE — Progress Notes (Signed)
Inpatient Rehabilitation-Admissions Coordinator   CIR consult received. Noted pt is now supervision level for ambulation and Min G/Mod A for most ADLs. Both PT and OT are now recommending home with [facility] therapy. Agree with therapy that based on current functional status, pt no longer needs an IP Rehab stay. AC will sign off and will discuss new recommendations with TOC team.   Please call if questions.   Raechel Ache, OTR/L  Rehab Admissions Coordinator  [phone] 12/06/2019 3:59 PM

## 2019-12-07 ENCOUNTER — Inpatient Hospital Stay (HOSPITAL_COMMUNITY): Payer: 59

## 2019-12-07 LAB — BASIC METABOLIC PANEL
Anion gap: 8 (ref 5–15)
BUN: 11 mg/dL (ref 6–20)
CO2: 27 mmol/L (ref 22–32)
Calcium: 8.4 mg/dL — ABNORMAL LOW (ref 8.9–10.3)
Chloride: 98 mmol/L (ref 98–111)
Creatinine, Ser: 0.62 mg/dL (ref 0.61–1.24)
GFR calc Af Amer: >60 mL/min (ref >60)
GFR calc non Af Amer: >60 mL/min (ref >60)
Glucose, Bld: 210 mg/dL — ABNORMAL HIGH (ref 70–99)
Potassium: 3.9 mmol/L (ref 3.5–5.1)
Sodium: 133 mmol/L — ABNORMAL LOW (ref 135–145)

## 2019-12-07 LAB — CBC
HCT: 34 % — ABNORMAL LOW (ref 39.0–52.0)
Hemoglobin: 10.9 g/dL — ABNORMAL LOW (ref 13.0–17.0)
MCH: 28.8 pg (ref 26.0–34.0)
MCHC: 32.1 g/dL (ref 30.0–36.0)
MCV: 89.7 fL (ref 80.0–100.0)
Platelets: 423 10*3/uL — ABNORMAL HIGH (ref 150–400)
RBC: 3.79 MIL/uL — ABNORMAL LOW (ref 4.22–5.81)
RDW: 15.9 % — ABNORMAL HIGH (ref 11.5–15.5)
WBC: 12.2 10*3/uL — ABNORMAL HIGH (ref 4.0–10.5)
nRBC: 0 % (ref 0.0–0.2)

## 2019-12-07 LAB — GLUCOSE, CAPILLARY
Glucose-Capillary: 102 mg/dL — ABNORMAL HIGH (ref 70–99)
Glucose-Capillary: 189 mg/dL — ABNORMAL HIGH (ref 70–99)
Glucose-Capillary: 195 mg/dL — ABNORMAL HIGH (ref 70–99)
Glucose-Capillary: 196 mg/dL — ABNORMAL HIGH (ref 70–99)
Glucose-Capillary: 209 mg/dL — ABNORMAL HIGH (ref 70–99)
Glucose-Capillary: 229 mg/dL — ABNORMAL HIGH (ref 70–99)
Glucose-Capillary: 305 mg/dL — ABNORMAL HIGH (ref 70–99)

## 2019-12-07 LAB — COOXEMETRY PANEL
Carboxyhemoglobin: 3.1 % — ABNORMAL HIGH (ref 0.5–1.5)
Carboxyhemoglobin: 2.9 % — ABNORMAL HIGH (ref 0.5–1.5)
Methemoglobin: 1.1 % (ref 0.0–1.5)
Methemoglobin: 1.2 % (ref 0.0–1.5)
O2 Saturation: 51.8 %
O2 Saturation: 49.5 %
Total hemoglobin: 11.4 g/dL — ABNORMAL LOW (ref 12.0–16.0)
Total hemoglobin: 11.3 g/dL — ABNORMAL LOW (ref 12.0–16.0)

## 2019-12-07 LAB — MAGNESIUM: Magnesium: 1.7 mg/dL (ref 1.7–2.4)

## 2019-12-07 MED ORDER — LOSARTAN POTASSIUM 25 MG PO TABS
25.0000 mg | ORAL_TABLET | Freq: Two times a day (BID) | ORAL | Status: DC
  Administered 2019-12-07: 25 mg via ORAL
  Filled 2019-12-07: qty 1

## 2019-12-07 MED ORDER — FUROSEMIDE 40 MG PO TABS
40.0000 mg | ORAL_TABLET | Freq: Every day | ORAL | Status: DC
  Administered 2019-12-08: 40 mg via ORAL
  Filled 2019-12-07: qty 1

## 2019-12-07 MED ORDER — SACUBITRIL-VALSARTAN 24-26 MG PO TABS
1.0000 | ORAL_TABLET | Freq: Two times a day (BID) | ORAL | Status: DC
  Administered 2019-12-07 – 2019-12-08 (×2): 1 via ORAL
  Filled 2019-12-07 (×2): qty 1

## 2019-12-07 NOTE — Progress Notes (Signed)
Pt positioned back to bed. Pacer wires and chest tube sutures removed by Johann Capers, PA. Pt tolerated well. Patient lying in supine position for one hour after removal. RN will continue to monitor.

## 2019-12-07 NOTE — Discharge Summary (Signed)
[address]       [place] [id number]             [phone]      Physician Discharge Summary  Patient ID: Kevin Underwood MRN: [id number] DOB/AGE: [birth year] 58 y.o.  Admit date: 11/20/2019 Discharge date: 12/08/2019  Admission Diagnoses: Patient Active Problem List   Diagnosis Date Noted   Chest tube in place    Cardiogenic shock [facility](HCC)    Acute respiratory failure (HCC)    RUQ abdominal pain    Cardiomyopathy (HCC) 11/21/2019   Acute systolic CHF (congestive heart failure) (HCC) 11/21/2019   History of alcohol abuse Quit 6 mos ago 11/21/2019   History of recreational drug use 11/21/2019   PAF (paroxysmal atrial fibrillation) (HCC) 11/21/2019   Cardiac volume overload 11/21/2019   Elevated brain natriuretic peptide (BNP) level 11/21/2019   Leg edema, right 11/21/2019   Hyponatremia 11/21/2019   Abnormal electrocardiogram (ECG) (EKG) 11/21/2019   Hyperglycemia 11/21/2019   Tremulousness 11/21/2019   Restlessness and agitation 11/21/2019   OSA (obstructive sleep apnea) 11/21/2019   Atrial fibrillation with RVR (HCC) 11/20/2019   Acute congestive heart failure (HCC) 11/20/2019   Acute respiratory distress 11/20/2019   COPD (chronic obstructive pulmonary disease) (HCC)    Hypertension    Type 2 diabetes mellitus (HCC)    Closed fracture of 5th metacarpal 08/31/2012    Discharge Diagnoses:  Principal Problem:   Atrial fibrillation with RVR (HCC) Active Problems:   Acute congestive heart failure (HCC)   Acute respiratory distress   COPD (chronic obstructive pulmonary disease) (HCC)   Hypertension   Type 2 diabetes mellitus (HCC)   Cardiomyopathy (HCC)   Acute systolic CHF (congestive heart failure) (HCC)   History of alcohol abuse Quit 6 mos ago   History of recreational drug use   PAF (paroxysmal atrial fibrillation) (HCC)   Cardiac volume overload   Elevated brain natriuretic peptide (BNP) level   Leg edema,  right   Hyponatremia   Abnormal electrocardiogram (ECG) (EKG)   Hyperglycemia   Tremulousness   Restlessness and agitation   OSA (obstructive sleep apnea)   RUQ abdominal pain   Cardiogenic shock (HCC)   Acute respiratory failure (HCC)   S/P CABG x 4   Chest tube in place   Discharged Condition: good  HPI:   58 yo man referred for consideration of CABG. He was in USOH until last week when he presented with SOB and found to be in Afib which lead to heart failure sx. [facility] was [facility] and underwent LHC today demonstrating severe 3V CAD and severely depressed LV function. Since cath, he has been confused. He has not responded as expected to IV milrinone gtt or IV lasix bolus.   Hospital Course:   On 12/18 Kevin Underwood underwent a CABG x 4 with Dr. Vickey SagesAtkins. He tolerated the procedure well and was transferred to the surgical ICU. POD 1 continued to make good progress. Good LVAD flows. Weaning nitric oxide slowly. Good urine output. Hemoglobin continued to drop overnight and there was some ecchymosis developing over the right flank area. Heparin was held and vent weaning was held. Heart failure continued to follow along with us. He had an episode of afib and was started on Amio gtt. Converted to NSR. He was extubated on 12/21. We continued to wean his drips as tolerated. We continued diuresis. His lasix drip was discontinued due to low CVP and a drop in  his MAP. COOX was 62. We discontinued his PA catheter on 12/24. We continued to remove chest tubes. Impella removed on 12/26. He continued to make good progress. We continued to weaning his milrinone. He was stable to transfer to the stepdown unit. On 12/28 the patient left the ICU and was transferred to [facility]. We discontinued his milrinone. He remained on PO Amio. We increased his cozaar for better BP control. We removed his epicardial pacing wires and chest tube sutures. Today, his coox is 6. His incision is healing well, he is ambulating with  limited assistance, he is tolerating room air, and he is ready for discharge home.   Consults: heart failure  Significant Diagnostic Studies:  Treatments:   Date of Procedure:    11/26/2019  Preoperative Diagnosis:      Severe 3-vessel Coronary Artery Disease with severe heart failure due to biventricular dysfunction, s/p NSTEMI and Impella LVAD insertion  Postoperative Diagnosis:    Same  Procedure:        Coronary Artery Bypass Grafting x 4             Left Internal Mammary Artery to Distal Left Anterior Descending Coronary Artery; Saphenous Vein Graft to Diagonal Branch Coronary Artery;  Sapheonous Vein Graft to Obtuse Marginal Branch of Left Circumflex Coronary Artery and Posterior Descending Coronary Artery as sequenced graft; Endoscopic Vein Harvest from right Thigh and Lower Leg Completion graft surveillance using indocyanine green fluorescence imaging (SPY) Bilateral pulmonary vein isolation with RFA energy and left atrial appendage clipping  Surgeon:        B. Lorayne Marek, MD  Assistant:       Webb Laws PA-C  Anesthesia: get  Operative Findings: ? Severely reduced left ventricular systolic function; moderately reduced right ventricular function ? Good quality left internal mammary artery conduit ? Good quality saphenous vein conduit ? Good quality target vessels for grafting    Procedure(s): REMOVAL OF IMPELLA LEFT VENTRICULAR ASSIST DEVICE, right axilla Intraoperative Transesophageal Echocardiogram Procedure Note  Kevin Underwood male 58 y.o. 12/04/2019  Procedure(s) and Anesthesia Type:    * REMOVAL OF IMPELLA LEFT VENTRICULAR ASSIST DEVICE, right axilla - General    * Intraoperative Transesophageal Echocardiogram  Surgeon(s) and Role:    * [name], MD - Primary   Indications: The patient is s/p CABG for severely depressed LV function managed with Impella device which is now ready for removal based on clinical judgement.        Surgeon: [name]   Assistants: Staff  Anesthesia: General endotracheal anesthesia  ASA Class: 4  Discharge Exam: Blood pressure 117/72, pulse 86, temperature 97.8 F (36.6 C), temperature source Oral, resp. rate 16, height 5\' 10"  (1.778 m), weight 78.1 kg, SpO2 96 %.     General appearance: alert, cooperative and no distress Heart: regular rate and rhythm Lungs: dim Left>right base Abdomen: benign Extremities: minor ankle edema Wound: incis healing well   Disposition: Discharge disposition: 01-Home or Self Care      Discharge Instructions    Amb Referral to Cardiac Rehabilitation   Complete by: As directed    Diagnosis: CABG   CABG X ___: 4   After initial evaluation and assessments completed: Virtual Based Care may be provided alone or in conjunction with Phase 2 Cardiac Rehab based on patient barriers.: Yes   Discharge patient   Complete by: As directed    Discharge disposition: 01-Home or Self Care   Discharge patient date: 12/08/2019  Allergies as of 12/08/2019   No Known Allergies     Medication List    STOP taking these medications   Advair Diskus 250-50 MCG/DOSE Aepb Generic drug: Fluticasone-Salmeterol   albuterol (2.5 MG/3ML) 0.083% nebulizer solution Commonly known as: PROVENTIL   gabapentin 600 MG tablet Commonly known as: NEURONTIN   Invokamet XR [phone] MG Tb24 Generic drug: Canagliflozin-metFORMIN HCl ER   lisinopril 20 MG tablet Commonly known as: ZESTRIL     TAKE these medications   amiodarone 400 MG tablet Commonly known as: PACERONE Take 1 tablet (400 mg total) by mouth daily. Start taking on: December 09, 2019   aspirin 81 MG tablet Take 81 mg by mouth daily.   carvedilol 6.25 MG tablet Commonly known as: COREG Take 1 tablet (6.25 mg total) by mouth 2 (two) times daily with a meal.   clopidogrel 75 MG tablet Commonly known as: PLAVIX Take 1 tablet (75 mg total) by mouth daily. Start taking on:  December 09, 2019   colchicine 0.6 MG tablet Take 1 tablet (0.6 mg total) by mouth daily. Start taking on: December 09, 2019   Combivent Respimat 20-100 MCG/ACT Aers respimat Generic drug: Ipratropium-Albuterol Inhale 1 puff into the lungs every 6 (six) hours as needed. What changed:   how much to take  when to take this   digoxin 0.125 MG tablet Commonly known as: LANOXIN Take 1 tablet (0.125 mg total) by mouth daily. Start taking on: December 09, 2019   furosemide 40 MG tablet Commonly known as: LASIX Take 1 tablet (40 mg total) by mouth daily. Start taking on: December 09, 2019   glimepiride 2 MG tablet Commonly known as: AMARYL Take 2 mg by mouth daily. Reported on 11/28/2015   Invokana 100 MG Tabs tablet Generic drug: canagliflozin Take 100 mg by mouth daily.   magnesium oxide 400 (241.3 Mg) MG tablet Commonly known as: MAG-OX Take 1 tablet (400 mg total) by mouth 2 (two) times daily.   metFORMIN 500 MG tablet Commonly known as: GLUCOPHAGE Take 1,000 mg by mouth 2 (two) times daily with a meal.   multivitamin with minerals Tabs tablet Take 1 tablet by mouth daily. Start taking on: December 09, 2019   potassium chloride SA 20 MEQ tablet Commonly known as: KLOR-CON Take 1 tablet (20 mEq total) by mouth daily.   rosuvastatin 40 MG tablet Commonly known as: CRESTOR Take 1 tablet (40 mg total) by mouth daily at 6 PM.   sacubitril-valsartan 24-26 MG Commonly known as: ENTRESTO Take 1 tablet by mouth 2 (two) times daily.   spironolactone 25 MG tablet Commonly known as: ALDACTONE Take 1 tablet (25 mg total) by mouth daily. Start taking on: December 09, 2019   Symbicort 160-4.5 MCG/ACT inhaler Generic drug: budesonide-formoterol Inhale 2 puffs into the lungs 2 (two) times daily.   traMADol 50 MG tablet Commonly known as: ULTRAM Take 1 tablet (50 mg total) by mouth every 6 (six) hours as needed for up to 7 days for moderate pain.   Victoza 18 MG/3ML  Sopn Generic drug: liraglutide Inject 1.2 Units into the skin daily.      Follow-up Information    Wonda Olds, MD Follow up.   Specialty: Cardiothoracic Surgery Why: Your routine follow-up appointment is on 12/13/2019 at 11:00am.  Please obtain a chest x-ray at [facility] at 10:30 AM.  It is located in the same office complex. Contact information: [address] [place] [address] [phone]  Kirstie Peri, MD. Call in 1 day(s).   Specialty: Internal Medicine Contact information: [address]  [place] [address] [phone]        Jonelle Sidle, MD .   Specialty: Cardiology Contact information: [address] [place] [address] [phone]        Sherald Hess, NP Follow up on 12/21/2019.   Specialty: Cardiology Why: Please arrive 15 minutes early for your 10am post-hospital cardiology follow-up appointment. The parking code for January is 6009 Contact information: [address] [place] [address] [phone]          The patient has been discharged on:   1.Beta Blocker:  Yes [ yes  ]                              No   [   ]                              If No, reason:  2.Ace Inhibitor/ARB: Yes [ yes  ]                                     No  [    ]                                     If No, reason:  3.Statin:   Yes [ yes ]                  No  [   ]                  If No, reason:  4.Ecasa:  Yes  [ yes  ]                  No   [   ]                  If No, reason:    Signed: Glenice Laine Modesta Sammons PA-C 12/08/2019, 11:30 AM

## 2019-12-07 NOTE — Progress Notes (Signed)
CARDIAC REHAB PHASE I   Offered to walk with pt. Pt states he has not slept much, and people keep bugging him today. Pt visibly more irritated today. Education completed with pt and wife. Pt instructed on importance of site care and monitoring incision daily. Encouraged continued IS use, walks, and sternal precautions. Pt given the in-the-tube sheet along with heart healthy and diabetic diets. Reviewed restrictions and exercise guidelines. Pt and wife deny further questions. Pt anxious to go home. Pt requesting walker for home use, RN aware. Will refer to CRP [facility] [place].   [id number] Rufina Falco, RN BSN 12/07/2019 1:35 PM

## 2019-12-07 NOTE — Progress Notes (Signed)
Inpatient Diabetes Program Recommendations  AACE/ADA: New Consensus Statement on Inpatient Glycemic Control (2015)  Target Ranges:  Prepandial:   less than 140 mg/dL      Peak postprandial:   less than 180 mg/dL (1-2 hours)      Critically ill patients:  140 - 180 mg/dL   Lab Results  Component Value Date   GLUCAP 195 (H) 12/07/2019   HGBA1C 12.1 (H) 11/20/2019    Review of Glycemic Control Results for CONLAN, MICELI (MRN [id number]) as of 12/07/2019 11:55  Ref. Range 12/06/2019 23:09 12/07/2019 03:12 12/07/2019 08:04 12/07/2019 11:28  Glucose-Capillary Latest Ref Range: 70 - 99 mg/dL 218 (H) 209 (H) 196 (H) 195 (H)    Diabetes history: DM2 Outpatient Diabetes medications:  Amaryl 2 mg daily; Invokamet XR [phone] MG TB24 (2 tabs daily); Invokana 100 mg daily; Glucophage 1000 mg twice daily; Victoza 1.2 units twice daily Current orders for Inpatient glycemic control: Novolog 0-20 units Q4H  Inpatient Diabetes Program Recommendations:     Note A1c is 12.2%.  Patient is agreeable to discharge home on insulin.  He has used insulin pens in the past and prefers the pens.    -Consider adding Lantus 12 units daily and have patient follow up with PCP  Thank you, Geoffry Paradise, RN, BSN Diabetes Coordinator Inpatient Diabetes Program [phone] (team pager from 8a-5p)

## 2019-12-07 NOTE — Progress Notes (Signed)
Nutrition Follow-up  RD working remotely.  DOCUMENTATION CODES:   Not applicable  INTERVENTION:   - Continue Ensure Enlive po TID, each supplement provides 350 kcal and 20 grams of protein  - Continue MVI with minerals daily  NUTRITION DIAGNOSIS:   Inadequate oral intake related to inability to eat as evidenced by NPO status.  Progressing, pt now on 2 gram sodium diet  GOAL:   Patient will meet greater than or equal to 90% of their needs  Progressing  MONITOR:   PO intake, Supplement acceptance, Labs, Weight trends, Skin, I & O's  REASON FOR ASSESSMENT:   Ventilator    ASSESSMENT:   Patient is a 58 yo male with recent hospital stay for pneumonia. Hx of HTN, DM-2 (A1C-12.1%), COPD and arthirits. Newly diagnosed cardiomyopathy.  12/16 - intubated, s/p R/L heart cath and Impella 12/17 - extubated 12/18 - intubated, s/p CABG x 4 12/21 - extubated 12/26 - Impella removed  Noted pt will likely d/c tomorrow.  Weight down 20 lbs total since admit. Pt is net negative 22 L.  Spoke with pt via phone call to room. Pt reports appetite has improved greatly and that he is eating well at meals. Last 2 meal completions charted as 100%.  Pt reports he is drinking about 2 Ensure daily and prefers the chocolate flavor.  Meal Completion: 10-100%  Medications reviewed and include: Ensure Enlive TID, Lasix, SSI q 4 hours, magnesium oxide, MVI with minerals, protonix, Klor-con 40 mEq BID, senna, spironolactone  Labs reviewed: sodium 133 CBG's: 155-257 x 24 hours  UOP: 3050 ml x 24 hours I/O's: -22.0 L since admit  Diet Order:   Diet Order            Diet 2 gram sodium Room service appropriate? Yes; Fluid consistency: Thin  Diet effective now              EDUCATION NEEDS:   Education needs have been addressed  Skin:  Skin Assessment: Skin Integrity Issues: Skin Integrity Issues: Incisions: R axilla (12/16); R leg and abdomen (12/18)  Last BM:  12/06/19 large  type 7  Height:   Ht Readings from Last 1 Encounters:  11/24/19 5\' 10"  (1.778 m)    Weight:   Wt Readings from Last 1 Encounters:  12/07/19 79 kg    Ideal Body Weight:  75.4 kg  BMI:  Body mass index is 24.99 kg/m.  Estimated Nutritional Needs:   Kcal:  2250-2450 kcal  Protein:  115-130 grams  Fluid:  >/= 2.2 L/day    Gaynell Face, MS, RD, LDN Inpatient Clinical Dietitian Pager: [phone] Weekend/After Hours: [phone]

## 2019-12-07 NOTE — Progress Notes (Signed)
      [address]       [place] [address]             [phone]      3 Days Post-Op Procedure(s) (LRB): REMOVAL OF IMPELLA LEFT VENTRICULAR ASSIST DEVICE, right axilla (Right) Intraoperative Transesophageal Echocardiogram Subjective: Feels okay this morning. He wants to go home.   Objective: Vital signs in last 24 hours: Temp:  [97.5 F (36.4 C)-98.1 F (36.7 C)] 97.9 F (36.6 C) (12/29 0803) Pulse Rate:  [90-92] 91 (12/29 0803) Cardiac Rhythm: Normal sinus rhythm (12/29 0700) Resp:  [12-26] 17 (12/29 0803) BP: (104-126)/(62-72) 104/62 (12/29 0803) SpO2:  [93 %-98 %] 97 % (12/29 0803) Weight:  [79 kg] 79 kg (12/29 0650)  Hemodynamic parameters for last 24 hours: CVP:  [1 mmHg-9 mmHg] 5 mmHg  Intake/Output from previous day: 12/28 0701 - 12/29 0700 In: 127.4 [I.V.:27.4; IV Piggyback:100] Out: 3065 [Urine:3050; Drains:15] Intake/Output this shift: Total I/O In: 240 [P.O.:240] Out: -   General appearance: alert, cooperative and no distress Heart: regular rate and rhythm, S1, S2 normal, no murmur, click, rub or gallop Lungs: clear to auscultation bilaterally Abdomen: soft, non-tender; bowel sounds normal; no masses,  no organomegaly Extremities: extremities normal, atraumatic, no cyanosis or edema Wound: clean and dry  Lab Results: Recent Labs    12/06/19 0449 12/07/19 0500  WBC 12.9* 12.2*  HGB 10.2* 10.9*  HCT 31.7* 34.0*  PLT 385 423*   BMET:  Recent Labs    12/06/19 0449 12/07/19 0500  NA 134* 133*  K 4.0 3.9  CL 95* 98  CO2 31 27  GLUCOSE 188* 210*  BUN 10 11  CREATININE 0.69 0.62  CALCIUM 8.2* 8.4*    PT/INR: No results for input(s): LABPROT, INR in the last 72 hours. ABG    Component Value Date/Time   PHART 7.429 12/04/2019 0907   HCO3 33.8 (H) 12/04/2019 0907   TCO2 33 (H) 12/04/2019 0910   ACIDBASEDEF 6.0 (H) 11/24/2019 1718   O2SAT 51.8 12/07/2019 0625   CBG (last 3)  Recent Labs    12/06/19 2309  12/07/19 0312 12/07/19 0804  GLUCAP 218* 209* 196*    Assessment/Plan: S/P Procedure(s) (LRB): REMOVAL OF IMPELLA LEFT VENTRICULAR ASSIST DEVICE, right axilla (Right) Intraoperative Transesophageal Echocardiogram  1. CV-Turn off milrinone today and coox for the morning. BP has been well controlled overnight and this morning. Maps in the 80s. NSR in the 90s.Continue Amio 400mg  BID.  2. Pulm-tolerating room air with excellent oxygenation 3. H and H 10.9/34.0, stable acute blood loss anemia 4. Endo-on multiple PO agents at home. Currently on SSI with moderate control 5. Continue ASA and Plavix  Plan: Hopefully home tomorrow. Will pull EPW today.    LOS: 17 days    Elgie Collard 12/07/2019

## 2019-12-07 NOTE — Progress Notes (Signed)
      [address]       [place] [address]             [phone]       Epicardial pacing wires pulled this morning. Tips are in tack. Both A and V wires pulled without resistance. Patient is resting comfortably for 1 hours flat in the bed. Hopeful for discharge tomorrow.    Nicholes Rough, PA-C

## 2019-12-07 NOTE — Progress Notes (Addendum)
Patient ID: Kevin Underwood, male   DOB: July 14, 1961, 58 y.o.   MRN: [id number]     Advanced Heart Failure Rounding Note  PCP-Cardiologist: Nona Dell, MD   Subjective:    - Impella 5.5 placed 12/16 due to cardiogenic shock.  - CABG x 4 + Maze 12/18 - Impella removed 12/26  Co-ox 52%. Milrinone discontinued this am. Good response to PO lasix w/ additional 3L out yesterday. Wt down to preop baseline at 174 lb. Scr and K stable. CVP 4   Epicardial pacing wires removed.     Objective:   Weight Range: 79 kg Body mass index is 24.99 kg/m.   Vital Signs:   Temp:  [97.5 F (36.4 C)-98.1 F (36.7 C)] 98 F (36.7 C) (12/29 1212) Pulse Rate:  [56-92] 56 (12/29 1024) Resp:  [12-17] 17 (12/29 0803) BP: (104-126)/(61-72) 109/61 (12/29 1212) SpO2:  [93 %-98 %] 96 % (12/29 0928) Weight:  [79 kg] 79 kg (12/29 0650) Last BM Date: 12/06/19  Weight change: Filed Weights   12/05/19 0500 12/06/19 0500 12/07/19 0650  Weight: 87.5 kg 82.7 kg 79 kg    Intake/Output:   Intake/Output Summary (Last 24 hours) at 12/07/2019 1359 Last data filed at 12/07/2019 0805 Gross per 24 hour  Intake 240 ml  Output 1300 ml  Net -1060 ml      Physical Exam  CVP 4 General:  Well appearing. No respiratory difficulty HEENT: normal Neck: supple. no JVD. Carotids 2+ bilat; no bruits. No lymphadenopathy or thyromegaly appreciated. Cor: PMI nondisplaced. Regular rate & rhythm. No rubs, gallops or murmurs. Sternotomy incsion stable Lungs: clear Abdomen: soft, nontender, nondistended. No hepatosplenomegaly. No bruits or masses. Good bowel sounds. Extremities: no cyanosis, clubbing, rash, edema Neuro: alert & oriented x 3, cranial nerves grossly intact. moves all 4 extremities w/o difficulty. Affect pleasant.   Telemetry   NSR 80s, NSVT 4 beats personally reviewed.   Labs    CBC Recent Labs    12/06/19 0449 12/07/19 0500  WBC 12.9* 12.2*  HGB 10.2* 10.9*  HCT 31.7* 34.0*  MCV 90.3 89.7   PLT 385 423*   Basic Metabolic Panel Recent Labs    62/83/15 0449 12/07/19 0500  NA 134* 133*  K 4.0 3.9  CL 95* 98  CO2 31 27  GLUCOSE 188* 210*  BUN 10 11  CREATININE 0.69 0.62  CALCIUM 8.2* 8.4*  MG 1.9 1.7   Liver Function Tests No results for input(s): AST, ALT, ALKPHOS, BILITOT, PROT, ALBUMIN in the last 72 hours. No results for input(s): LIPASE, AMYLASE in the last 72 hours. Cardiac Enzymes No results for input(s): CKTOTAL, CKMB, CKMBINDEX, TROPONINI in the last 72 hours.  BNP: BNP (last 3 results) Recent Labs    11/22/19 0414 11/23/19 0453 11/25/19 0500  BNP 578.0* 1,054.0* 596.1*    ProBNP (last 3 results) No results for input(s): PROBNP in the last 8760 hours.   D-Dimer No results for input(s): DDIMER in the last 72 hours. Hemoglobin A1C No results for input(s): HGBA1C in the last 72 hours. Fasting Lipid Panel No results for input(s): CHOL, HDL, LDLCALC, TRIG, CHOLHDL, LDLDIRECT in the last 72 hours. Thyroid Function Tests No results for input(s): TSH, T4TOTAL, T3FREE, THYROIDAB in the last 72 hours.  Invalid input(s): FREET3  Other results:   Imaging    DG Chest 2 View  Result Date: 12/07/2019 CLINICAL DATA:  Postop heart surgery 1 week ago.  Chest pain, CHF EXAM: CHEST - 2 VIEW COMPARISON:  12/06/2011  FINDINGS: COPD. Small right hydropneumothorax again noted, similar to prior study. Right PICC line remains in place, unchanged. Changes of CABG. Small left pleural effusion. Rounded peripheral density in the right hemithorax, likely loculated pleural fluid. Left base atelectasis or infiltrate. IMPRESSION: Continued bilateral pleural effusions, small. Small right apical pneumothorax again noted, stable. Left lower lobe atelectasis. Peripheral rounded pleural based density in the right mid hemithorax, likely loculated fluid. No significant change since prior study. Electronically Signed   By: Rolm Baptise M.D.   On: 12/07/2019 10:48      Medications:     Scheduled Medications: . amiodarone  400 mg Oral Daily  . arformoterol  15 mcg Nebulization BID  . aspirin  81 mg Oral Daily  . budesonide (PULMICORT) nebulizer solution  0.5 mg Nebulization BID  . carvedilol  6.25 mg Oral BID WC  . Chlorhexidine Gluconate Cloth  6 each Topical Daily  . clopidogrel  75 mg Oral Daily  . colchicine  0.6 mg Oral Daily  . digoxin  0.125 mg Oral Daily  . enoxaparin (LOVENOX) injection  40 mg Subcutaneous Q24H  . feeding supplement (ENSURE ENLIVE)  237 mL Oral TID BM  . furosemide  80 mg Oral Daily  . insulin aspart  0-20 Units Subcutaneous Q4H  . ipratropium  0.5 mg Nebulization BID  . losartan  25 mg Oral BID  . magnesium oxide  400 mg Oral BID  . mouth rinse  15 mL Mouth Rinse BID  . multivitamin with minerals  1 tablet Oral Daily  . pantoprazole (PROTONIX) IV  40 mg Intravenous QHS  . potassium chloride  40 mEq Oral BID  . rosuvastatin  40 mg Oral q1800  . senna-docusate  1 tablet Oral Daily  . spironolactone  25 mg Oral Daily  . traZODone  50 mg Oral QHS    Infusions:   PRN Medications: bisacodyl, levalbuterol, metoprolol tartrate, ondansetron (ZOFRAN) IV, oxyCODONE, sodium chloride flush, sodium chloride flush, sorbitol, traMADol    Assessment/Plan   1. Acute systolic CHF => cardiogenic shock:  Echo showed EF < 20% with mildly decreased RV systolic function. He was admitted with afib/RVR and CHF.  Cath with elevated filing pressures, low CO (CI 1.4 by RHC), and severe 3 vessel coronary disease.  Ischemic cardiomyopathy, possibly contribution from tachycardia with previous atrial fibrillation. Impella 5.5 placed due to cardiogenic shock on 12/16.  CABG on 12/18 with Maze. Impella removed 12/26. Echo (12/27) with EF 20-25%, moderately decreased RV systolic function, mild-moderate MR, PASP 31.5 mmHg. Off epinephrine.  Milrinone discontinued this am.  Co-ox 52%, CVP 4. Wt back down to preop wt.  Now on po Lasix.  -  Continue Lasix 80 po daily - Continue spiro 25mg  daily - On carvedilol 3.125 bid. Will continue  - Continue losartan 25 mg bid>> later transition to Entresto if BP remains stable.  - Continue digoxin 0.125 daily.  2. Atrial fibrillation: With RVR at admission, back in NSR now.  Maze with surgery.  Remains in NSR. Heparin off with recent bleeding. LAA clipped.  - Continue lovenox for DVT prophylaxis.  - Eliquis if atrial fibrillation recurs. - Continue amio 400 mg daily.  3. CAD: Severe 3VD in diabetic smoker.  He has exertional chest pain at baseline.  Was not admitted with ACS. Now s/p CABG.  - Continue ASA 81 daily.  - rosuvastatin 40 daily.    4. ABLA from impella site: Hgb now stable and off heparin gtt.  5. Pulmonary hypertension: Mixed  pulmonary venous/pulmonary arterial HTN.  Suspect group 2 + group 3 (COPD).  PA pressures have come down with diuresis. Echo 12/27 with PASP 31.5 mmHg.  6. Diabetes: Poor control.  Per IM, would eventually benefit from SGLT2 inhibitor.   7. OSA: Strongly suspected. Needs outpatient sleep study 8. COPD: Suspected, former smoker.  Emphysema seen on CT chest.  9. ID: Afebrile  10. PVCs/NSVT - continue amio - Keep mg > 2.0 K > 4.0  Length of Stay: [address] PA-C  12/07/2019, 1:59 PM  Advanced Heart Failure Team Pager [phone] (M-F; 7a - 4p)  Please contact [facility] for night-coverage after hours (4p -7a ) and weekends on [email or web address]  Patient seen with PA, agree with the above note.   He is feeling good today, wants to go home.  Pacing wires out.  CVP 4.  SBP in 120s. Milrinone stopped this morning.   General: NAD Neck: No JVD, no thyromegaly or thyroid nodule.  Lungs: Clear to auscultation bilaterally with normal respiratory effort. CV: Nondisplaced PMI.  Heart regular S1/S2, no S3/S4, no murmur.  1+ ankle edema.   Abdomen: Soft, nontender, no hepatosplenomegaly, no distention.  Skin: Intact without lesions or rashes.   Neurologic: Alert and oriented x 3.  Psych: Normal affect. Extremities: No clubbing or cyanosis.  HEENT: Normal.   I will stop losartan today and start him on Entresto 24/26 bid.  I will decrease Lasix to 40 mg daily.  I think that he should be ready for home tomorrow if continues to do well.   Marca Ancona 12/07/2019

## 2019-12-08 ENCOUNTER — Telehealth (HOSPITAL_COMMUNITY): Payer: Self-pay | Admitting: Pharmacist

## 2019-12-08 LAB — COOXEMETRY PANEL
Carboxyhemoglobin: 2.9 % — ABNORMAL HIGH (ref 0.5–1.5)
Methemoglobin: 1.2 % (ref 0.0–1.5)
O2 Saturation: 52.3 %
Total hemoglobin: 11.8 g/dL — ABNORMAL LOW (ref 12.0–16.0)

## 2019-12-08 LAB — MAGNESIUM: Magnesium: 1.5 mg/dL — ABNORMAL LOW (ref 1.7–2.4)

## 2019-12-08 LAB — CBC
HCT: 36.6 % — ABNORMAL LOW (ref 39.0–52.0)
Hemoglobin: 11.6 g/dL — ABNORMAL LOW (ref 13.0–17.0)
MCH: 28.9 pg (ref 26.0–34.0)
MCHC: 31.7 g/dL (ref 30.0–36.0)
MCV: 91.3 fL (ref 80.0–100.0)
Platelets: 503 10*3/uL — ABNORMAL HIGH (ref 150–400)
RBC: 4.01 MIL/uL — ABNORMAL LOW (ref 4.22–5.81)
RDW: 16.1 % — ABNORMAL HIGH (ref 11.5–15.5)
WBC: 13.6 10*3/uL — ABNORMAL HIGH (ref 4.0–10.5)
nRBC: 0 % (ref 0.0–0.2)

## 2019-12-08 LAB — GLUCOSE, CAPILLARY
Glucose-Capillary: 172 mg/dL — ABNORMAL HIGH (ref 70–99)
Glucose-Capillary: 163 mg/dL — ABNORMAL HIGH (ref 70–99)
Glucose-Capillary: 196 mg/dL — ABNORMAL HIGH (ref 70–99)

## 2019-12-08 LAB — BASIC METABOLIC PANEL
Anion gap: 11 (ref 5–15)
BUN: 14 mg/dL (ref 6–20)
CO2: 23 mmol/L (ref 22–32)
Calcium: 8.4 mg/dL — ABNORMAL LOW (ref 8.9–10.3)
Chloride: 98 mmol/L (ref 98–111)
Creatinine, Ser: 0.62 mg/dL (ref 0.61–1.24)
GFR calc Af Amer: >60 mL/min (ref >60)
GFR calc non Af Amer: >60 mL/min (ref >60)
Glucose, Bld: 180 mg/dL — ABNORMAL HIGH (ref 70–99)
Potassium: 4.3 mmol/L (ref 3.5–5.1)
Sodium: 132 mmol/L — ABNORMAL LOW (ref 135–145)

## 2019-12-08 MED ORDER — GLIMEPIRIDE 2 MG PO TABS
2.0000 mg | ORAL_TABLET | Freq: Every day | ORAL | Status: DC
  Administered 2019-12-08: 2 mg via ORAL
  Filled 2019-12-08: qty 1

## 2019-12-08 MED ORDER — COMBIVENT RESPIMAT 20-100 MCG/ACT IN AERS: 1.0000 | INHALATION_SPRAY | Freq: Four times a day (QID) | RESPIRATORY_TRACT | 1 refills | Status: DC | PRN

## 2019-12-08 MED ORDER — CLOPIDOGREL BISULFATE 75 MG PO TABS: 75.0000 mg | ORAL_TABLET | Freq: Every day | ORAL | 1 refills | Status: DC

## 2019-12-08 MED ORDER — ADULT MULTIVITAMIN W/MINERALS CH: 1.0000 | ORAL_TABLET | Freq: Every day | ORAL | Status: DC

## 2019-12-08 MED ORDER — FUROSEMIDE 40 MG PO TABS: 40.0000 mg | ORAL_TABLET | Freq: Every day | ORAL | 1 refills | Status: DC

## 2019-12-08 MED ORDER — CANAGLIFLOZIN-METFORMIN HCL ER 150-1000 MG PO TB24: 2.0000 | ORAL_TABLET | Freq: Every day | ORAL | Status: DC

## 2019-12-08 MED ORDER — CANAGLIFLOZIN 100 MG PO TABS
100.0000 mg | ORAL_TABLET | Freq: Every day | ORAL | Status: DC
  Administered 2019-12-08: 100 mg via ORAL
  Filled 2019-12-08: qty 1

## 2019-12-08 MED ORDER — DIGOXIN 125 MCG PO TABS: 0.1250 mg | ORAL_TABLET | Freq: Every day | ORAL | 1 refills | Status: DC

## 2019-12-08 MED ORDER — COLCHICINE 0.6 MG PO TABS: 0.6000 mg | ORAL_TABLET | Freq: Every day | ORAL | 1 refills | Status: DC

## 2019-12-08 MED ORDER — SPIRONOLACTONE 25 MG PO TABS: 25.0000 mg | ORAL_TABLET | Freq: Every day | ORAL | 1 refills | Status: DC

## 2019-12-08 MED ORDER — SACUBITRIL-VALSARTAN 24-26 MG PO TABS: 1.0000 | ORAL_TABLET | Freq: Two times a day (BID) | ORAL | 1 refills | Status: DC

## 2019-12-08 MED ORDER — AMIODARONE HCL 400 MG PO TABS: 400.0000 mg | ORAL_TABLET | Freq: Every day | ORAL | 1 refills | Status: DC

## 2019-12-08 MED ORDER — POTASSIUM CHLORIDE CRYS ER 20 MEQ PO TBCR: 20.0000 meq | EXTENDED_RELEASE_TABLET | Freq: Every day | ORAL | 0 refills | Status: DC

## 2019-12-08 MED ORDER — METFORMIN HCL ER 500 MG PO TB24
1000.0000 mg | ORAL_TABLET | Freq: Every day | ORAL | Status: DC
  Administered 2019-12-08: 1000 mg via ORAL
  Filled 2019-12-08: qty 2

## 2019-12-08 MED ORDER — CARVEDILOL 6.25 MG PO TABS: 6.2500 mg | ORAL_TABLET | Freq: Two times a day (BID) | ORAL | 1 refills | Status: DC

## 2019-12-08 MED ORDER — MAGNESIUM OXIDE 400 (241.3 MG) MG PO TABS: 400.0000 mg | ORAL_TABLET | Freq: Two times a day (BID) | ORAL | 0 refills | Status: DC

## 2019-12-08 MED ORDER — TRAMADOL HCL 50 MG PO TABS: 50.0000 mg | ORAL_TABLET | Freq: Four times a day (QID) | ORAL | 0 refills | Status: AC | PRN

## 2019-12-08 MED ORDER — ROSUVASTATIN CALCIUM 40 MG PO TABS: 40.0000 mg | ORAL_TABLET | Freq: Every day | ORAL | 1 refills | Status: DC

## 2019-12-08 NOTE — Care Management Important Message (Signed)
Important Message  Patient Details  Name: Kevin Underwood MRN: [id number] Date of Birth: 08-23-1961   Medicare Important Message Given:  Yes     Memory Argue 12/08/2019, 1:10 PM

## 2019-12-08 NOTE — Telephone Encounter (Signed)
Patient Advocate Encounter   Received notification from [facility] Medicaid that prior authorization for [name] is required.   PA submitted on [facility] Tracks Confirmation #: [id number] Recipient ID: [id number] M Status is pending   Will continue to follow.  Audry Riles, PharmD, BCPS, BCCP, CPP Heart Failure Clinic Pharmacist [phone]

## 2019-12-08 NOTE — Progress Notes (Addendum)
Patient ID: Kevin Underwood, male   DOB: 10-Jan-1961, 58 y.o.   MRN: [id number]     Advanced Heart Failure Rounding Note  PCP-Cardiologist: Nona Dell, MD  AHF: Dr. Shirlee Latch   Subjective:    - Impella 5.5 placed 12/16 due to cardiogenic shock.  - CABG x 4 + Maze 12/18 - Impella removed 12/26  Co-ox 52% off Milrinone. PICC removed. Wt stable, down additional 2 lb and back at preop wt.   Has mild HA but no cardiac symptoms. Tolerating Entresto w/o SEs. BP is stable. Awaiting d/c today.     Objective:   Weight Range: 78.1 kg Body mass index is 24.71 kg/m.   Vital Signs:   Temp:  [97.4 F (36.3 C)-98 F (36.7 C)] 97.4 F (36.3 C) (12/30 0721) Pulse Rate:  [56-93] 86 (12/30 0727) Resp:  [16-20] 16 (12/30 0727) BP: (93-133)/(57-83) 117/72 (12/30 0942) SpO2:  [93 %-100 %] 96 % (12/30 0727) Weight:  [78.1 kg] 78.1 kg (12/30 0445) Last BM Date: 12/07/19  Weight change: Filed Weights   12/06/19 0500 12/07/19 0650 12/08/19 0445  Weight: 82.7 kg 79 kg 78.1 kg    Intake/Output:   Intake/Output Summary (Last 24 hours) at 12/08/2019 1012 Last data filed at 12/08/2019 0345 Gross per 24 hour  Intake 740.06 ml  Output 9 ml  Net 731.06 ml      Physical Exam   PHYSICAL EXAM: General:  Well appearing. No respiratory difficulty HEENT: normal Neck: supple. no JVD. Carotids 2+ bilat; no bruits. No lymphadenopathy or thyromegaly appreciated. Cor: PMI nondisplaced. Regular rate & rhythm. No rubs, gallops or murmurs. Lungs: clear Abdomen: soft, nontender, nondistended. No hepatosplenomegaly. No bruits or masses. Good bowel sounds. Extremities: no cyanosis, clubbing, rash, edema Neuro: alert & oriented x 3, cranial nerves grossly intact. moves all 4 extremities w/o difficulty. Affect pleasant.   Telemetry   NSR 80s personally reviewed.   Labs    CBC Recent Labs    12/07/19 0500 12/08/19 0434  WBC 12.2* 13.6*  HGB 10.9* 11.6*  HCT 34.0* 36.6*  MCV 89.7 91.3  PLT  423* 503*   Basic Metabolic Panel Recent Labs    75/05/18 0500 12/08/19 0434  NA 133* 132*  K 3.9 4.3  CL 98 98  CO2 27 23  GLUCOSE 210* 180*  BUN 11 14  CREATININE 0.62 0.62  CALCIUM 8.4* 8.4*  MG 1.7 1.5*   Liver Function Tests No results for input(s): AST, ALT, ALKPHOS, BILITOT, PROT, ALBUMIN in the last 72 hours. No results for input(s): LIPASE, AMYLASE in the last 72 hours. Cardiac Enzymes No results for input(s): CKTOTAL, CKMB, CKMBINDEX, TROPONINI in the last 72 hours.  BNP: BNP (last 3 results) Recent Labs    11/22/19 0414 11/23/19 0453 11/25/19 0500  BNP 578.0* 1,054.0* 596.1*    ProBNP (last 3 results) No results for input(s): PROBNP in the last 8760 hours.   D-Dimer No results for input(s): DDIMER in the last 72 hours. Hemoglobin A1C No results for input(s): HGBA1C in the last 72 hours. Fasting Lipid Panel No results for input(s): CHOL, HDL, LDLCALC, TRIG, CHOLHDL, LDLDIRECT in the last 72 hours. Thyroid Function Tests No results for input(s): TSH, T4TOTAL, T3FREE, THYROIDAB in the last 72 hours.  Invalid input(s): FREET3  Other results:   Imaging    DG Chest 2 View  Result Date: 12/07/2019 CLINICAL DATA:  Postop heart surgery 1 week ago.  Chest pain, CHF EXAM: CHEST - 2 VIEW COMPARISON:  12/06/2011 FINDINGS:  COPD. Small right hydropneumothorax again noted, similar to prior study. Right PICC line remains in place, unchanged. Changes of CABG. Small left pleural effusion. Rounded peripheral density in the right hemithorax, likely loculated pleural fluid. Left base atelectasis or infiltrate. IMPRESSION: Continued bilateral pleural effusions, small. Small right apical pneumothorax again noted, stable. Left lower lobe atelectasis. Peripheral rounded pleural based density in the right mid hemithorax, likely loculated fluid. No significant change since prior study. Electronically Signed   By: Charlett Nose M.D.   On: 12/07/2019 10:48     Medications:       Scheduled Medications:  amiodarone  400 mg Oral Daily   arformoterol  15 mcg Nebulization BID   aspirin  81 mg Oral Daily   budesonide (PULMICORT) nebulizer solution  0.5 mg Nebulization BID   canagliflozin  100 mg Oral QAC breakfast   carvedilol  6.25 mg Oral BID WC   Chlorhexidine Gluconate Cloth  6 each Topical Daily   clopidogrel  75 mg Oral Daily   colchicine  0.6 mg Oral Daily   digoxin  0.125 mg Oral Daily   enoxaparin (LOVENOX) injection  40 mg Subcutaneous Q24H   feeding supplement (ENSURE ENLIVE)  237 mL Oral TID BM   furosemide  40 mg Oral Daily   glimepiride  2 mg Oral Q breakfast   insulin aspart  0-20 Units Subcutaneous Q4H   ipratropium  0.5 mg Nebulization BID   magnesium oxide  400 mg Oral BID   mouth rinse  15 mL Mouth Rinse BID   metFORMIN  1,000 mg Oral Q breakfast   multivitamin with minerals  1 tablet Oral Daily   pantoprazole (PROTONIX) IV  40 mg Intravenous QHS   potassium chloride  40 mEq Oral BID   rosuvastatin  40 mg Oral q1800   sacubitril-valsartan  1 tablet Oral BID   senna-docusate  1 tablet Oral Daily   spironolactone  25 mg Oral Daily   traZODone  50 mg Oral QHS    Infusions:    PRN Medications: bisacodyl, levalbuterol, metoprolol tartrate, ondansetron (ZOFRAN) IV, oxyCODONE, sodium chloride flush, sodium chloride flush, sorbitol, traMADol    Assessment/Plan   1. Acute systolic CHF => cardiogenic shock:  Echo showed EF < 20% with mildly decreased RV systolic function. He was admitted with afib/RVR and CHF.  Cath with elevated filing pressures, low CO (CI 1.4 by RHC), and severe 3 vessel coronary disease.  Ischemic cardiomyopathy, possibly contribution from tachycardia with previous atrial fibrillation. Impella 5.5 placed due to cardiogenic shock on 12/16.  CABG on 12/18 with Maze. Impella removed 12/26. Echo (12/27) with EF 20-25%, moderately decreased RV systolic function, mild-moderate MR, PASP 31.5 mmHg. Off epinephrine.  Milrinone  discontinued this am.  Co-ox 52%. Wt back down to preop wt.  Now on po Lasix.  - Continue Lasix 40 po daily - Continue spiro 25mg  daily - On carvedilol 6.25 mb bid.   - On Entresto 24-26 bid. BP stable. SCr and K ok.  - Continue digoxin 0.125 daily.  2. Atrial fibrillation: With RVR at admission, back in NSR now.  Maze with surgery.  Remains in NSR. Heparin off with recent bleeding. LAA clipped.  - Continue lovenox for DVT prophylaxis.  - Eliquis if atrial fibrillation recurs. - Continue amio 400 mg daily.  3. CAD: Severe 3VD in diabetic smoker.  He has exertional chest pain at baseline.  Was not admitted with ACS. Now s/p CABG.  - Continue ASA 81 daily.  -  rosuvastatin 40 daily.    4. ABLA from impella site: Hgb now stable and off heparin gtt.  5. 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  Suspect group 2 + group 3 (COPD).  PA pressures have come down with diuresis. Echo 12/27 with PASP 31.5 mmHg.  6. Diabetes: Poor control.  Per IM, would eventually benefit from SGLT2 inhibitor.   7. OSA: Strongly suspected. Needs outpatient sleep study 8. COPD: Suspected, former smoker.  Emphysema seen on CT chest.  9. ID: Afebrile  10. PVCs/NSVT - continue amio - Keep mg > 2.0 K > 4.0  Dispo: plan d/c home today. [facility] f/u has been arranged. Appt date and time in AVS.    Length of Stay: [address] PA-C  12/08/2019, 10:12 AM  Advanced Heart Failure Team Pager [phone] (M-F; [place])  Please contact [facility] for night-coverage after hours (4p -7a ) and weekends on [email or web address]  Patient seen with NP, agree with the above note.   Stable overnight, no dyspnea.  He is tolerating Entresto.  Can go home today on current HF regimen and will arrange followup in CHF clinic.   Loralie Champagne 12/08/2019

## 2019-12-08 NOTE — Progress Notes (Signed)
Occupational Therapy Treatment Patient Details Name: Kevin Underwood MRN: [id number] DOB: 09/26/1961 Today's Date: 12/08/2019    History of present illness 58 yo man referred for consideration of CABG. He was in [place] until last week when he presented with SOB and found to be in Afib which lead to heart failure sx. 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 Pt underwent impella 5.5 placement on 12/16 and CABG x4 on 12/18. Impella removed on 12/26.   OT comments  Pt with headache. Plan is for discharge today with wife's assist. Educated in sternal precautions and activities to avoid and energy conservation strategies. Reinforced with written handout. Updated d/c to no OT.  Follow Up Recommendations  No OT follow up    Equipment Recommendations  None recommended by OT    Recommendations for Other Services      Precautions / Restrictions Precautions Precautions: Sternal;Fall Precaution Comments: verbally reviewed sternal prcautions, cued during session       Mobility Bed Mobility Overal bed mobility: Modified Independent                Transfers                      Balance                                           ADL either performed or assessed with clinical judgement   ADL                       Lower Body Dressing: Set up                 General ADL Comments: able to cross foot over opposite knee to don socks, recommended seated showering, pt has a chair and use of long handled bath sponge, educated pt to avoid hot shower and to plan to rest after showering prior to putting on his clothes.     Vision       Perception     Praxis      Cognition Arousal/Alertness: Awake/alert Behavior During Therapy: WFL for tasks assessed/performed Overall Cognitive Status: Within Functional Limits for tasks assessed                                           Exercises     Shoulder Instructions       General Comments      Pertinent Vitals/ Pain       Pain Assessment: Faces Faces Pain Scale: Hurts even more Pain Location: head Pain Descriptors / Indicators: Aching Pain Intervention(s): Patient requesting pain meds-RN notified;Monitored during session;Limited activity within patient's tolerance  Home Living                                          Prior Functioning/Environment              Frequency  Min 2X/week        Progress Toward Goals  OT Goals(current goals can now be found in the care plan section)  Progress towards OT goals: Progressing  toward goals  Acute Rehab OT Goals Patient Stated Goal: To return to independent mobility OT Goal Formulation: With patient Time For Goal Achievement: 12/20/19  Plan Discharge plan remains appropriate    Co-evaluation                 AM-PAC OT "6 Clicks" Daily Activity     Outcome Measure   Help from another person eating meals?: None Help from another person taking care of personal grooming?: A Little Help from another person toileting, which includes using toliet, bedpan, or urinal?: A Little Help from another person bathing (including washing, rinsing, drying)?: A Little Help from another person to put on and taking off regular upper body clothing?: None Help from another person to put on and taking off regular lower body clothing?: A Little 6 Click Score: 20    End of Session    OT Visit Diagnosis: Muscle weakness (generalized) (M62.81);Other abnormalities of gait and mobility (R26.89);Pain   Activity Tolerance Patient limited by pain   Patient Left in bed;with call bell/phone within reach;with nursing/sitter in room   Nurse Communication          Time: 1287-8676 OT Time Calculation (min): 14 min  Charges: OT General Charges $OT Visit: 1 Visit OT Treatments $Self Care/Home Management : 8-22 mins  Martie Round, OTR/L Acute Rehabilitation Services Pager: [phone] Office: [phone]   Evern Bio 12/08/2019, 12:28 PM

## 2019-12-08 NOTE — TOC Transition Note (Signed)
Transition of Care [facility]) - CM/SW Discharge Note   Patient Details  Name: Kevin Underwood MRN: [id number] Date of Birth: September 03, 1961  Transition of Care [facility]) CM/SW Contact:  Bartholomew Crews, RN Phone Number: [phone] 12/08/2019, 2:26 PM   Clinical Narrative:    Patient provided [name] card for first 30 days. Prior auth initiated by pharmacy for Medicaid. Patient transitioned home today.     Barriers to Discharge: No Barriers Identified   Patient Goals and CMS Choice Patient states their goals for this hospitalization and ongoing recovery are:: to go home with wife CMS Medicare.gov Compare Post Acute Care list provided to:: Patient Choice offered to / list presented to : Patient  Discharge Placement                       Discharge Plan and Services                DME Arranged: Shower stool, Walker rolling DME Agency: [employer] Date DME Agency Contacted: 12/08/19 Time DME Agency Contacted: [phone] Representative spoke with at DME Agency: [place] ([place]) Interventions     Readmission Risk Interventions No flowsheet data found.

## 2019-12-08 NOTE — Progress Notes (Signed)
[id number] Checked with pt to see if any questions re ed done by Cardiac Rehab yesterday.  Pt stated he had no questions and was waiting to go home.  [name] RN BSN 12/08/2019 1:38 PM

## 2019-12-08 NOTE — Progress Notes (Addendum)
[address]       [place] [address]             [phone]      4 Days Post-Op Procedure(s) (LRB): REMOVAL OF IMPELLA LEFT VENTRICULAR ASSIST DEVICE, right axilla (Right) Intraoperative Transesophageal Echocardiogram Subjective: Feels well, hopes to go home today  Objective: Vital signs in last 24 hours: Temp:  [97.4 F (36.3 C)-98 F (36.7 C)] 97.4 F (36.3 C) (12/30 0721) Pulse Rate:  [56-93] 86 (12/30 0727) Cardiac Rhythm: Normal sinus rhythm (12/30 0700) Resp:  [16-20] 16 (12/30 0727) BP: (93-133)/(57-83) 93/57 (12/30 0721) SpO2:  [93 %-100 %] 96 % (12/30 0727) Weight:  [78.1 kg] 78.1 kg (12/30 0445)  Hemodynamic parameters for last 24 hours: CVP:  [4 mmHg-5 mmHg] 5 mmHg  Intake/Output from previous day: 12/29 0701 - 12/30 0700 In: 985.5 [P.O.:960; I.V.:5.5] Out: 9 [Drains:9] Intake/Output this shift: No intake/output data recorded.  General appearance: alert, cooperative and no distress Heart: regular rate and rhythm Lungs: dim Left>right base Abdomen: benign Extremities: minor ankle edema Wound: incis healing well  Lab Results: Recent Labs    12/07/19 0500 12/08/19 0434  WBC 12.2* 13.6*  HGB 10.9* 11.6*  HCT 34.0* 36.6*  PLT 423* 503*   BMET:  Recent Labs    12/07/19 0500 12/08/19 0434  NA 133* 132*  K 3.9 4.3  CL 98 98  CO2 27 23  GLUCOSE 210* 180*  BUN 11 14  CREATININE 0.62 0.62  CALCIUM 8.4* 8.4*    PT/INR: No results for input(s): LABPROT, INR in the last 72 hours. ABG    Component Value Date/Time   PHART 7.429 12/04/2019 0907   HCO3 33.8 (H) 12/04/2019 0907   TCO2 33 (H) 12/04/2019 0910   ACIDBASEDEF 6.0 (H) 11/24/2019 1718   O2SAT 52.3 12/08/2019 0335   CBG (last 3)  Recent Labs    12/07/19 2330 12/08/19 0332 12/08/19 0726  GLUCAP 189* 172* 163*    Meds Scheduled Meds: . amiodarone  400 mg Oral Daily  . arformoterol  15 mcg Nebulization BID  . aspirin  81 mg Oral Daily  . budesonide  (PULMICORT) nebulizer solution  0.5 mg Nebulization BID  . carvedilol  6.25 mg Oral BID WC  . Chlorhexidine Gluconate Cloth  6 each Topical Daily  . clopidogrel  75 mg Oral Daily  . colchicine  0.6 mg Oral Daily  . digoxin  0.125 mg Oral Daily  . enoxaparin (LOVENOX) injection  40 mg Subcutaneous Q24H  . feeding supplement (ENSURE ENLIVE)  237 mL Oral TID BM  . furosemide  40 mg Oral Daily  . insulin aspart  0-20 Units Subcutaneous Q4H  . ipratropium  0.5 mg Nebulization BID  . magnesium oxide  400 mg Oral BID  . mouth rinse  15 mL Mouth Rinse BID  . multivitamin with minerals  1 tablet Oral Daily  . pantoprazole (PROTONIX) IV  40 mg Intravenous QHS  . potassium chloride  40 mEq Oral BID  . rosuvastatin  40 mg Oral q1800  . sacubitril-valsartan  1 tablet Oral BID  . senna-docusate  1 tablet Oral Daily  . spironolactone  25 mg Oral Daily  . traZODone  50 mg Oral QHS   Continuous Infusions: PRN Meds:.bisacodyl, levalbuterol, metoprolol tartrate, ondansetron (ZOFRAN) IV, oxyCODONE, sodium chloride flush, sodium chloride flush, sorbitol, traMADol  Xrays DG Chest 2 View  Result Date: 12/07/2019 CLINICAL DATA:  Postop heart surgery 1 week ago.  Chest pain, CHF EXAM: CHEST - 2 VIEW COMPARISON:  12/06/2011 FINDINGS: COPD. Small right hydropneumothorax again noted, similar to prior study. Right PICC line remains in place, unchanged. Changes of CABG. Small left pleural effusion. Rounded peripheral density in the right hemithorax, likely loculated pleural fluid. Left base atelectasis or infiltrate. IMPRESSION: Continued bilateral pleural effusions, small. Small right apical pneumothorax again noted, stable. Left lower lobe atelectasis. Peripheral rounded pleural based density in the right mid hemithorax, likely loculated fluid. No significant change since prior study. Electronically Signed   By: Charlett Nose M.D.   On: 12/07/2019 10:48    Assessment/Plan: S/P Procedure(s) (LRB): REMOVAL OF  IMPELLA LEFT VENTRICULAR ASSIST DEVICE, right axilla (Right) Intraoperative Transesophageal Echocardiogram  1 conts to do well overall 2 will d/c PICC and JP drain today 3 hemodyn stable, BP to 90's at times, co-ox 52 today 4 renal fxn in normal range 5 minor hyponatremia 6 MG++ 1.5- replace 7 slight increase in leukocytosis to WBC 13.6, no fevers- incis look good 8 H/H improved 9 has not been restarted on home DM2 Meds, HgbA1c 12.1 on admit - will resume, will need better management per primary care long term- he understands he has to improve lifestyle and diet and seems to be motivated to do so.  10 poss home today if AHF team satisfied with current clinical status 11 resume home resp RX at d/c  LOS: 18 days    Rowe Clack [facility] 12/08/2019 Pager [phone]  Pt seen and examined; appears stable for discharge. I would like to have him return to office on Monday 12/13/19 with PA and lateral CXR

## 2019-12-08 NOTE — Progress Notes (Signed)
Physical Therapy Treatment Patient Details Name: Kevin Underwood MRN: [id number] DOB: 1961/08/14 Today's Date: 12/08/2019    History of Present Illness 58 yo man referred for consideration of CABG. He was in [place] until last week when he presented with SOB and found to be in Afib which lead to heart failure sx. 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 Pt underwent impella 5.5 placement on 12/16 and CABG x4 on 12/18. Impella removed on 12/26.    PT Comments    Patient continues to make good progress toward PT goals and tolerated increased mobility well. VSS on RA. Current plan remains appropriate.     Follow Up Recommendations  Home health PT;Supervision/Assistance - 24 hour     Equipment Recommendations  Rolling walker with 5" wheels    Recommendations for Other Services       Precautions / Restrictions Precautions Precautions: Sternal;Fall Precaution Comments: verbally reviewed sternal prcautions, cued during session    Mobility  Bed Mobility Overal bed mobility: Modified Independent             General bed mobility comments: pt standing in room with wife present upon arrival  Transfers Overall transfer level: Needs assistance Equipment used: None Transfers: Sit to/from Stand Sit to Stand: Supervision         General transfer comment: pt able to stand without assistance with hands on knees   Ambulation/Gait Ambulation/Gait assistance: Supervision;Min guard Gait Distance (Feet): 300 Feet Assistive device: None Gait Pattern/deviations: Step-through pattern;Decreased stride length;Drifts right/left     General Gait Details: pt with mild drifting R and L but no LOB; pt tolerated horizontal head turns, turning, changing directions without significantly increased instability    Stairs             Wheelchair Mobility    Modified Rankin (Stroke Patients Only)       Balance                                             Cognition Arousal/Alertness: Awake/alert Behavior During Therapy: WFL for tasks assessed/performed Overall Cognitive Status: Within Functional Limits for tasks assessed                                        Exercises      General Comments General comments (skin integrity, edema, etc.): VSS on RA       Pertinent Vitals/Pain Pain Assessment: Faces Faces Pain Scale: Hurts little more Pain Location: head Pain Descriptors / Indicators: Headache Pain Intervention(s): Patient requesting pain meds-RN notified;Monitored during session;Limited activity within patient's tolerance    Home Living                      Prior Function            PT Goals (current goals can now be found in the care plan section) Acute Rehab PT Goals Patient Stated Goal: To return to independent mobility Progress towards PT goals: Progressing toward goals    Frequency    Min 3X/week      PT Plan Current plan remains appropriate    Co-evaluation              AM-PAC PT "6  Clicks" Mobility   Outcome Measure  Help needed turning from your back to your side while in a flat bed without using bedrails?: None Help needed moving from lying on your back to sitting on the side of a flat bed without using bedrails?: None Help needed moving to and from a bed to a chair (including a wheelchair)?: None Help needed standing up from a chair using your arms (e.g., wheelchair or bedside chair)?: None Help needed to walk in hospital room?: None Help needed climbing 3-5 steps with a railing? : A Little 6 Click Score: 23    End of Session   Activity Tolerance: Patient tolerated treatment well Patient left: in chair;with call bell/phone within reach;with family/visitor present Nurse Communication: Mobility status PT Visit Diagnosis: Unsteadiness on feet (R26.81)     Time: 6712-4580 PT Time Calculation (min) (ACUTE  ONLY): 10 min  Charges:  $Gait Training: 8-22 mins                     Erline Levine, PTA Acute Rehabilitation Services Pager: [phone] Office: [phone]     Carolynne Edouard 12/08/2019, 12:47 PM

## 2019-12-08 NOTE — TOC Initial Note (Addendum)
Transition of Care [facility]) - Initial/Assessment Note    Patient Details  Name: Kevin Underwood MRN: [id number] Date of Birth: [birth year]  Transition of Care [facility]) CM/SW Contact:    Alberteen Sam, [place] Phone Number: [phone] 12/08/2019, 12:45 PM  Clinical Narrative:       Update: [employer] will deliver equipment to room, RN informed.              CSW spoke with patient regarding discharge planning, patient declines home health services at this time. Reports needing rolling walker and shower chair. CSW has reached out to PA for orders as this must be input prior to discharge. Pending DME orders at this time. Patient agreeable for [employer] to deliver equipment to home, as he reports his wife is currently ready to take him home.   CSW can reach out to [place] with [employer] for DME delivery to home once DME orders are in.   Expected Discharge Plan: Home/Self Care Barriers to Discharge: No Barriers Identified   Patient Goals and CMS Choice Patient states their goals for this hospitalization and ongoing recovery are:: to go home with wife CMS Medicare.gov Compare Post Acute Care list provided to:: Patient Choice offered to / list presented to : Patient  Expected Discharge Plan and Services Expected Discharge Plan: Home/Self Care       Living arrangements for the past 2 months: Single Family Home Expected Discharge Date: 12/08/19               DME Arranged: Shower stool, Walker rolling DME Agency: [employer] Date DME Agency Contacted: 12/08/19 Time DME Agency Contacted: [phone] Representative spoke with at DME Agency: [place]            Prior Living Arrangements/Services Living arrangements for the past 2 months: [place] with:: Self Patient language and need for interpreter reviewed:: Yes Do you feel safe going back to the place where you live?: Yes      Need for Family Participation in Patient Care: Yes (Comment) Care giver support system in place?: Yes (comment)    Criminal Activity/Legal Involvement Pertinent to Current Situation/Hospitalization: No - Comment as needed  Activities of Daily Living Home Assistive Devices/Equipment: CBG Meter ADL Screening (condition at time of admission) Patient's cognitive ability adequate to safely complete daily activities?: Yes Is the patient deaf or have difficulty hearing?: No Does the patient have difficulty seeing, even when wearing glasses/contacts?: No Does the patient have difficulty concentrating, remembering, or making decisions?: No Patient able to express need for assistance with ADLs?: Yes Does the patient have difficulty dressing or bathing?: No Independently performs ADLs?: Yes (appropriate for developmental age) Does the patient have difficulty walking or climbing stairs?: No Weakness of Legs: None Weakness of Arms/Hands: None  Permission Sought/Granted Permission sought to share information with : Case Manager, Customer service manager, Family Supports Permission granted to share information with : Yes, Verbal Permission Granted  Share Information with NAME: [name]  Permission granted to share info w AGENCY: [employer]  Permission granted to share info w Relationship: spouse  Permission granted to share info w Contact Information: [phone]  Emotional Assessment Appearance:: Appears stated age Attitude/Demeanor/Rapport: Gracious Affect (typically observed): Calm Orientation: : Oriented to Place, Oriented to  Time, Oriented to Self, Oriented to Situation Alcohol / Substance Use: Not Applicable Psych Involvement: No (comment)  Admission diagnosis:  RUQ abdominal pain [R10.11] Atrial fibrillation with RVR ([place]) [I48.91] Leg edema, right [R60.0] Ascites [R18.8] Acute congestive heart failure, unspecified heart failure  type (HCC) [I50.9] S/P CABG x 4 [Z95.1] Patient Active Problem List   Diagnosis Date Noted  . Chest tube in place   . 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 Cardiogenic shock  (HCC)   . Acute respiratory failure (HCC)   . RUQ abdominal pain   . Cardiomyopathy (HCC) 11/21/2019  . Acute systolic CHF (congestive heart failure) (HCC) 11/21/2019  . History of alcohol abuse Quit 6 mos ago 11/21/2019  . History of recreational drug use 11/21/2019  . PAF (paroxysmal atrial fibrillation) (HCC) 11/21/2019  . Cardiac volume overload 11/21/2019  . Elevated brain natriuretic peptide (BNP) level 11/21/2019  . Leg edema, right 11/21/2019  . Hyponatremia 11/21/2019  . Abnormal electrocardiogram (ECG) (EKG) 11/21/2019  . Hyperglycemia 11/21/2019  . Tremulousness 11/21/2019  . Restlessness and agitation 11/21/2019  . OSA (obstructive sleep apnea) 11/21/2019  . Atrial fibrillation with RVR (HCC) 11/20/2019  . Acute congestive heart failure (HCC) 11/20/2019  . Acute respiratory distress 11/20/2019  . COPD (chronic obstructive pulmonary disease) (HCC)   . Hypertension   . Type 2 diabetes mellitus (HCC)   . Closed fracture of 5th metacarpal 08/31/2012   PCP:  Kirstie Peri, MD Pharmacy:   [facility] [phone] - [address] [place] - [address] AT [facility] [address] [place] [place] [address] Phone: [phone] Fax: [phone]  [id number] - [place], [place] - [address] AT [facility] [address] [place] [address] Phone: [phone] Fax: [phone]     Social Determinants of Health (SDOH) Interventions    Readmission Risk Interventions No flowsheet data found.

## 2019-12-13 ENCOUNTER — Other Ambulatory Visit: Payer: Self-pay

## 2019-12-13 ENCOUNTER — Other Ambulatory Visit: Payer: Self-pay | Admitting: Cardiothoracic Surgery

## 2019-12-13 ENCOUNTER — Ambulatory Visit (INDEPENDENT_AMBULATORY_CARE_PROVIDER_SITE_OTHER): Payer: Self-pay | Admitting: Cardiothoracic Surgery

## 2019-12-13 ENCOUNTER — Ambulatory Visit
Admission: RE | Admit: 2019-12-13 | Discharge: 2019-12-13 | Disposition: A | Discharge status: Home / Self Care | Payer: 59 | Source: Ambulatory Visit | Attending: Cardiothoracic Surgery | Admitting: Cardiothoracic Surgery

## 2019-12-13 VITALS — BP 121/77 | HR 82 | Temp 97.5°F | Resp 24 | Ht 70.5 in | Wt 175.0 lb

## 2019-12-13 DIAGNOSIS — Z951 Presence of aortocoronary bypass graft: Secondary | ICD-10-CM

## 2019-12-13 DIAGNOSIS — I5020 Unspecified systolic (congestive) heart failure: Secondary | ICD-10-CM

## 2019-12-13 MED ORDER — FUROSEMIDE 80 MG PO TABS: 80.0000 mg | ORAL_TABLET | Freq: Every day | ORAL | 11 refills | Status: DC

## 2019-12-13 MED ORDER — TRAZODONE 25 MG HALF TABLET: 50.0000 mg | ORAL_TABLET | Freq: Every day | ORAL | Status: DC

## 2019-12-13 NOTE — Telephone Encounter (Signed)
Advanced Heart Failure Patient Advocate Encounter  Prior Authorization for Entresto through [facility] Medicaid was denied as it appears the patient also has a Medicare Part D plan. His Medicare part D plan does not require a prior authorization. His copay was $0.00.   Karle Plumber, PharmD, BCPS, BCCP, CPP Heart Failure Clinic Pharmacist [phone]

## 2019-12-13 NOTE — Progress Notes (Signed)
[address]       [place] [address]             [phone]     CARDIOTHORACIC SURGERY OFFICE NOTE  Referring Provider is Jonelle Sidle, MD Primary Cardiologist is Nona Dell, MD PCP is Kirstie Peri, MD   HPI:  59 yo man s/p CABG for low-EF. He was supported by Impella for several days. Responded very well to diuresis and med optimization. Discharged late last week. Presents now for f/u. No complaints except difficulty sleeping.   Current Outpatient Medications  Medication Sig Dispense Refill  . amiodarone (PACERONE) 400 MG tablet Take 1 tablet (400 mg total) by mouth daily. 30 tablet 1  . aspirin 81 MG tablet Take 81 mg by mouth daily.    . carvedilol (COREG) 6.25 MG tablet Take 1 tablet (6.25 mg total) by mouth 2 (two) times daily with a meal. 60 tablet 1  . clopidogrel (PLAVIX) 75 MG tablet Take 1 tablet (75 mg total) by mouth daily. 30 tablet 1  . colchicine 0.6 MG tablet Take 1 tablet (0.6 mg total) by mouth daily. 30 tablet 1  . COMBIVENT RESPIMAT 20-100 MCG/ACT AERS respimat Inhale 1 puff into the lungs every 6 (six) hours as needed. 4 g 1  . digoxin (LANOXIN) 0.125 MG tablet Take 1 tablet (0.125 mg total) by mouth daily. 30 tablet 1  . glimepiride (AMARYL) 2 MG tablet Take 2 mg by mouth daily. Reported on 11/28/2015    . INVOKANA 100 MG TABS tablet Take 100 mg by mouth daily.  2  . magnesium oxide (MAG-OX) 400 (241.3 Mg) MG tablet Take 1 tablet (400 mg total) by mouth 2 (two) times daily. 60 tablet 0  . metFORMIN (GLUCOPHAGE) 500 MG tablet Take 1,000 mg by mouth 2 (two) times daily with a meal.    . Multiple Vitamin (MULTIVITAMIN WITH MINERALS) TABS tablet Take 1 tablet by mouth daily.    . potassium chloride SA (KLOR-CON) 20 MEQ tablet Take 1 tablet (20 mEq total) by mouth daily. 30 tablet 0  . rosuvastatin (CRESTOR) 40 MG tablet Take 1 tablet (40 mg total) by mouth daily at 6 PM. 30 tablet 1  . sacubitril-valsartan (ENTRESTO) 24-26 MG  Take 1 tablet by mouth 2 (two) times daily. 60 tablet 1  . spironolactone (ALDACTONE) 25 MG tablet Take 1 tablet (25 mg total) by mouth daily. 30 tablet 1  . SYMBICORT 160-4.5 MCG/ACT inhaler Inhale 2 puffs into the lungs 2 (two) times daily.     . traMADol (ULTRAM) 50 MG tablet Take 1 tablet (50 mg total) by mouth every 6 (six) hours as needed for up to 7 days for moderate pain. 28 tablet 0  . VICTOZA 18 MG/3ML SOPN Inject 1.2 Units into the skin daily.  2  . furosemide (LASIX) 80 MG tablet Take 1 tablet (80 mg total) by mouth daily. 30 tablet 11   Current Facility-Administered Medications  Medication Dose Route Frequency Provider Last Rate Last Admin  . traZODone (DESYREL) tablet 50 mg  50 mg Oral QHS [name], MD          Physical Exam:   BP 121/77 (BP Location: Right Arm)   Pulse 82   Temp (!) 97.5 F (36.4 C) (Skin)   Resp (!) 24   Ht 5' 10.5" (1.791 m)   Wt 79.4 kg   SpO2 95% Comment: RA  BMI 24.75 kg/m   General:  Well-appearing, NAD  Chest:   Decreased BS at left base  CV:   rrr  Incisions:  C/d/i  Abdomen:  sntnd  Extremities:  Mild edema  Diagnostic Tests:  CXR with small left effusion and mild pulmonary edema  Impression:  Doing well after CABG for low EF  Plan:  F/u in 2 weeks with CXR Prescription given for 80 mg Lasix daily and trazadone 50 mg QHS  I spent in excess of 15  minutes during the conduct of this office consultation and >50% of this time involved direct face-to-face encounter with the patient for counseling and/or coordination of their care.  Level 2                 10 minutes Level 3                 15 minutes Level 4                 25 minutes Level 5                 40 minutes  B. Murvin Natal, MD 12/13/2019 11:11 AM

## 2019-12-20 ENCOUNTER — Telehealth (HOSPITAL_COMMUNITY): Payer: Self-pay

## 2019-12-20 ENCOUNTER — Ambulatory Visit: Payer: 59 | Admitting: Cardiothoracic Surgery

## 2019-12-20 NOTE — Telephone Encounter (Signed)
COVID-19 pre-appointment screening questions:   Do you have a history of COVID-19 or a positive test result in the past 7-10 days? no  To the best of your knowledge, have you been in close contact with anyone with a confirmed diagnosis of COVID 19? Mom positive 10 days ago, positive test Jan 2nd.  Have you had any one or more of the following: Fever, chills, cough, shortness of breath (out of the normal for you) or any flu-like symptoms? no  Are you experiencing any of the following symptoms that is new or out of usual for you:  . Ear, nose or throat discomfort . Sore throat . Headache . Muscle Pain . Diarrhea . Loss of taste or smell  no  Reviewed all the following with patient: . Use of hand sanitizer when entering the building . Everyone is required to wear a mask in the building, if you do not have a mask we are happy to provide you with one when you arrive . NO Visitor guidelines   If patient answers YES to any of questions they must change to a virtual visit and place note in comments about symptoms

## 2019-12-21 ENCOUNTER — Telehealth (HOSPITAL_COMMUNITY): Payer: Self-pay | Admitting: Cardiology

## 2019-12-21 ENCOUNTER — Ambulatory Visit (HOSPITAL_COMMUNITY)
Admission: RE | Admit: 2019-12-21 | Discharge: 2019-12-21 | Disposition: A | Discharge status: Home / Self Care | Payer: 59 | Source: Ambulatory Visit | Attending: Cardiology | Admitting: Cardiology

## 2019-12-21 VITALS — Wt 171.0 lb

## 2019-12-21 DIAGNOSIS — I5022 Chronic systolic (congestive) heart failure: Secondary | ICD-10-CM

## 2019-12-21 DIAGNOSIS — I480 Paroxysmal atrial fibrillation: Secondary | ICD-10-CM

## 2019-12-21 DIAGNOSIS — I255 Ischemic cardiomyopathy: Secondary | ICD-10-CM

## 2019-12-21 MED ORDER — AMIODARONE HCL 200 MG PO TABS: 200.0000 mg | ORAL_TABLET | Freq: Every day | ORAL | 3 refills | Status: DC

## 2019-12-21 NOTE — Telephone Encounter (Signed)
-----   Message from Sherald Hess, NP sent at 12/21/2019 10:35 AM EST ----- Regarding: avs Cut back amio 200 mg daily   Followup in 3 weeks in office with EKG, BMET, CBC, dig level.   Amy Clegg 10:36 AM

## 2019-12-21 NOTE — Addendum Note (Signed)
Encounter addended by: Theresia Bough, CMA on: 12/21/2019 12:07 PM  Actions taken: Order list changed, Clinical Note Signed

## 2019-12-21 NOTE — Telephone Encounter (Signed)
Attempted to contact patient review AVS instructions and mother reported patient stepped out  Will attempt to contact later

## 2019-12-21 NOTE — Progress Notes (Signed)
Heart Failure TeleHealth Note  Due to national recommendations of social distancing due to COVID 19, Audio/video telehealth visit is felt to be most appropriate for this patient at this time.  See MyChart message from today for patient consent regarding telehealth for [facility].  Date:  12/21/2019   ID:  Kevin Underwood, DOB Dec 11, 1960, MRN [id number]  Location: Home  Provider location: [facility] Type of Visit: Established patient   PCP:  Kirstie Peri, MD  Cardiologist:  Nona Dell, MD Primary HF: Dr Shirlee Latch  CT Surgery: Dr Vickey Sages  Chief Complaint: Heart Failure   History of Present Illness: Kevin Underwood is a 59 y.o. male with a history of CAD, CABG x 4 11/26/2019, PAD, anemia, COPD, HTN, DM, ICM, chronic systolic heart failure.    Admitted with increased SOB and found to be in Afib. He was transferred to [facility] and underwent LHC today demonstrating severe 3V CAD and severely depressed LV function. He had impella 5.5 which was removed as he improved. He has not responded as expected to IV milrinone gtt or IV lasix bolus.On 12/18 underwent a CABG x 4 with Dr. Vickey Sages. HF medication started and adjusted. Discharged on amio 400 mg daily.    He presents via [employer], applications for a telehealth visit today.   Today he returns for HF follow up.Overall feeling fine. Having trouble sleeping and complaining r shoulder pain. Using brace for his shoulder. Mild SOB walking exertion. Denies PND/Orthopnea. Tries to walk outside with wife. No bleeding issues. Appetite ok. No fever or chills. Weight at home  170-171  pounds. Taking all medications. Lives with Mom and she has COVID 19.   he denies symptoms worrisome for COVID 19.   12/05/19 ECHO 20-25%  11/26/19 CABG x 4 + Maze 12/18   Past Medical History:  Diagnosis Date  . Arthritis   . COPD (chronic obstructive pulmonary disease) (HCC)   . Essential hypertension   . Type 2 diabetes mellitus (HCC)    Past  Surgical History:  Procedure Laterality Date  . BACK SURGERY    . CLIPPING OF ATRIAL APPENDAGE N/A 11/26/2019   Procedure: Clipping Of Atrial Appendage using AtriCure 40 Clip;  Surgeon: [name], MD;  Location: [facility] OR;  Service: Open Heart Surgery;  Laterality: N/A;  . CORONARY ARTERY BYPASS GRAFT N/A 11/26/2019   Procedure: CORONARY ARTERY BYPASS GRAFTING (CABG) using endoscopic greater saphenous vein harvest: svc to OM; svc to Diag; svc to PD; and LIMA to LAD.;  Surgeon: [name], MD;  Location: [facility] OR;  Service: Open Heart Surgery;  Laterality: N/A;  . FOOT SURGERY    . HAND SURGERY    . INTRAOPERATIVE TRANSESOPHAGEAL ECHOCARDIOGRAM  12/04/2019   Procedure: Intraoperative Transesophageal Echocardiogram;  Surgeon: [name], MD;  Location: [facility] OR;  Service: Open Heart Surgery;;  . MAZE N/A 11/26/2019   Procedure: MAZE using Bilateral Pulmonary Vein isolation.;  Surgeon: [name], MD;  Location: [facility] OR;  Service: Open Heart Surgery;  Laterality: N/A;  . PLACEMENT OF IMPELLA LEFT VENTRICULAR ASSIST DEVICE N/A 11/24/2019   Procedure: PLACEMENT OF IMPELLA 5.5 LEFT VENTRICULAR ASSIST DEVICE;  Surgeon: [name], MD;  Location: [facility] OR;  Service: Open Heart Surgery;  Laterality: N/A;  . REMOVAL OF IMPELLA LEFT VENTRICULAR ASSIST DEVICE Right 12/04/2019   Procedure: REMOVAL OF IMPELLA LEFT VENTRICULAR ASSIST DEVICE, right axilla;  Surgeon: [name], MD;  Location: [facility];  Service: Open  Heart Surgery;  Laterality: Right;  . RIGHT/LEFT HEART CATH AND CORONARY ANGIOGRAPHY N/A 11/24/2019   Procedure: RIGHT/LEFT HEART CATH AND CORONARY ANGIOGRAPHY;  Surgeon: Larey Dresser, MD;  Location: [place] CV LAB;  Service: Cardiovascular;  Laterality: N/A;  . TEE WITHOUT CARDIOVERSION N/A 11/24/2019   Procedure: TRANSESOPHAGEAL ECHOCARDIOGRAM (TEE);  Surgeon: Wonda Olds, MD;  Location: [place];  Service: Open Heart Surgery;  Laterality: N/A;  . TEE WITHOUT  CARDIOVERSION N/A 11/26/2019   Procedure: TRANSESOPHAGEAL ECHOCARDIOGRAM (TEE);  Surgeon: Wonda Olds, MD;  Location: [place];  Service: Open Heart Surgery;  Laterality: N/A;     Current Outpatient Medications  Medication Sig Dispense Refill  . amiodarone (PACERONE) 400 MG tablet Take 1 tablet (400 mg total) by mouth daily. 30 tablet 1  . aspirin 81 MG tablet Take 81 mg by mouth daily.    . carvedilol (COREG) 6.25 MG tablet Take 1 tablet (6.25 mg total) by mouth 2 (two) times daily with a meal. 60 tablet 1  . clopidogrel (PLAVIX) 75 MG tablet Take 1 tablet (75 mg total) by mouth daily. 30 tablet 1  . colchicine 0.6 MG tablet Take 1 tablet (0.6 mg total) by mouth daily. 30 tablet 1  . COMBIVENT RESPIMAT 20-100 MCG/ACT AERS respimat Inhale 1 puff into the lungs every 6 (six) hours as needed. 4 g 1  . digoxin (LANOXIN) 0.125 MG tablet Take 1 tablet (0.125 mg total) by mouth daily. 30 tablet 1  . furosemide (LASIX) 80 MG tablet Take 1 tablet (80 mg total) by mouth daily. 30 tablet 11  . glimepiride (AMARYL) 2 MG tablet Take 2 mg by mouth daily. Reported on 11/28/2015    . INVOKANA 100 MG TABS tablet Take 100 mg by mouth daily.  2  . magnesium oxide (MAG-OX) 400 (241.3 Mg) MG tablet Take 1 tablet (400 mg total) by mouth 2 (two) times daily. 60 tablet 0  . metFORMIN (GLUCOPHAGE) 500 MG tablet Take 1,000 mg by mouth 2 (two) times daily with a meal.    . Multiple Vitamin (MULTIVITAMIN WITH MINERALS) TABS tablet Take 1 tablet by mouth daily.    . potassium chloride SA (KLOR-CON) 20 MEQ tablet Take 1 tablet (20 mEq total) by mouth daily. 30 tablet 0  . rosuvastatin (CRESTOR) 40 MG tablet Take 1 tablet (40 mg total) by mouth daily at 6 PM. 30 tablet 1  . sacubitril-valsartan (ENTRESTO) 24-26 MG Take 1 tablet by mouth 2 (two) times daily. 60 tablet 1  . spironolactone (ALDACTONE) 25 MG tablet Take 1 tablet (25 mg total) by mouth daily. 30 tablet 1  . SYMBICORT 160-4.5 MCG/ACT inhaler Inhale 2 puffs  into the lungs 2 (two) times daily.     Marland Kitchen VICTOZA 18 MG/3ML SOPN Inject 1.2 Units into the skin daily.  2   Current Facility-Administered Medications  Medication Dose Route Frequency Provider Last Rate Last Admin  . traZODone (DESYREL) tablet 50 mg  50 mg Oral QHS Atkins, Glenice Bow, MD        Allergies:   Patient has no known allergies.   Social History:  The patient  reports that he has been smoking cigarettes. He has a 30.00 pack-year smoking history. He has never used smokeless tobacco. He reports current alcohol use. He reports that he does not use drugs.   Family History:  The patient's family history includes Arthritis in an other family member; Asthma in an other family member; Diabetes in an other family member; Heart  disease in his sister; Lung disease in an other family member.   ROS:  Please see the history of present illness.   All other systems are personally reviewed and negative.   Exam:  Video Call; Exam is subjective and visual.) General:  Speaks in full sentences. No resp difficulty. Skin: Sternal incision approximated no open wounds.  Lungs: Normal respiratory effort with conversation.  Abdomen: Non-distended per patient report Extremities: Pt denies edema. Neuro: Alert & oriented x 3.   Recent Labs: 11/20/2019: TSH 1.658 11/25/2019: B Natriuretic Peptide 596.1 11/26/2019: ALT 31 12/08/2019: BUN 14; Creatinine, Ser 0.62; Hemoglobin 11.6; Magnesium 1.5; Platelets 503; Potassium 4.3; Sodium 132  Personally reviewed   Wt Readings from Last 3 Encounters:  12/21/19 77.6 kg  12/13/19 79.4 kg  12/08/19 78.1 kg      ASSESSMENT AND PLAN:  1. Chronic systolic CHF: ICM  Echo showed EF <20% with mildly decreased RV systolic function. He was admitted with afib/RVR and CHF. Cath with elevated filing pressures, low CO (CI 1.4 by RHC), and severe 3 vessel coronary disease. Ischemic cardiomyopathy, possibly contribution from tachycardia with previous atrial  fibrillation. Impella 5.5 placed due to cardiogenic shock on 12/16.  CABG on 12/18 with Maze. Impella removed 12/26. Echo (12/27) with EF 20-25%, moderately decreased RV systolic function, mild-moderate MR, PASP 31.5 mmHg. Off epinephrine. Plan to repeat ECHO after HF meds optimized.  - NYHA III but mild dsypnea with exertion.  -Volume status sounds stable. Continue Lasix 80 po daily - Continue spiro 25mg  daily - On carvedilol 3.125 bid. Will continue  - Continue Entresto 24-26 mg twice a day. .  - Continue digoxin 0.125 mg daily.  - Check BMET next visit  2. Atrial fibrillation: With RVR at admission 11/2019 but went back in NSR.  Maze with surgery.  LAA clipped.  - Eliquis if atrial fibrillation recurs. - Cut back amiodarone to 200 mg daily. ontinue amio 400 mg daily.  3. CAD: Severe 3VD in diabetic smoker. S/P 11/26/19 CABG - no chest pain.  - Continue ASA 81 daily.  - rosuvastatin 40 daily.   4.H/O ABLA from impella site 5. 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 Suspect group 2 + group 3 (COPD).  PA pressures have come down with diuresis. Echo 12/27 with PASP 31.5 mmHg.  6. Diabetes: Per PCP  7. OSA: Strongly suspected. Next visit set up sleep study.  8. COPD: Suspected, former smoker. Emphysema seen on CT chest.  9.PVCs/NSVT   COVID screen The patient does not have any symptoms that suggest any further testing/ screening at this time.  Social distancing reinforced today.  Patient Risk: After full review of this patients clinical status, I feel that they are at moderate risk for cardiac decompensation at this time.  Relevant cardiac medications were reviewed at length with the patient today. The patient does not have concerns regarding their medications at this time.   The following changes were made today:  Cut back amiodarone to 200 mg daily.  Recommended follow-up:Follow up in 3 weeks.   Today, I have spent 15 minutes with the patient with  telehealth technology discussing the above issues .    1/28, NP  12/21/2019 9:57 AM  [facility] [address] [facility] [place] [phone] (office) [phone] (fax)

## 2019-12-21 NOTE — Patient Instructions (Signed)
DECREASE Amiodarone to 200 mg, one tab daily   Your physician recommends that you schedule a follow-up appointment in: 3 weeks  in the Advanced Practitioners (PA/NP) Clinic    Do the following things EVERYDAY: 1) Weigh yourself in the morning before breakfast. Write it down and keep it in a log. 2) Take your medicines as prescribed 3) Eat low salt foods--Limit salt (sodium) to 2000 mg per day.  4) Stay as active as you can everyday 5) Limit all fluids for the day to less than 2 liters  At the [facility], you and your health needs are our priority. As part of our continuing mission to provide you with exceptional heart care, we have created designated Provider Care Teams. These Care Teams include your primary Cardiologist (physician) and Advanced Practice Providers (APPs- Physician Assistants and Nurse Practitioners) who all work together to provide you with the care you need, when you need it.   You may see any of the following providers on your designated Care Team at your next follow up: Marland Kitchen Dr Arvilla Meres . Dr Marca Ancona . Tonye Becket, NP . Robbie Lis, PA . Karle Plumber, PharmD   Please be sure to bring in all your medications bottles to every appointment.

## 2019-12-22 ENCOUNTER — Ambulatory Visit: Payer: 59 | Attending: Internal Medicine

## 2019-12-22 ENCOUNTER — Other Ambulatory Visit: Payer: Self-pay

## 2019-12-22 DIAGNOSIS — Z20822 Contact with and (suspected) exposure to covid-19: Secondary | ICD-10-CM

## 2019-12-23 LAB — NOVEL CORONAVIRUS, NAA: SARS-CoV-2, NAA: DETECTED — AB

## 2019-12-24 ENCOUNTER — Telehealth (HOSPITAL_COMMUNITY): Payer: Self-pay | Admitting: *Deleted

## 2019-12-24 NOTE — Telephone Encounter (Addendum)
Called patient today to discuss program and to see if he is interested in virtual app. Spoke with his wife because patient has just been diagnosed with COVID. His wife asked if I could call back. I agreed to call him back in 2 weeks when he is feeling better to discuss the program. I also advised her that if he is not feeling better and his breathing continues to worsen to go to the ER. She expressed an understanding.

## 2019-12-24 NOTE — Telephone Encounter (Signed)
Aware of medication changes and need to return for labs   AVS mailed

## 2019-12-24 NOTE — Telephone Encounter (Signed)
Patient reports he is COVID + and will be under quarantine for the next 14 days, advised will call at a later time to get scheduled.

## 2019-12-31 ENCOUNTER — Other Ambulatory Visit: Payer: Self-pay | Admitting: Cardiothoracic Surgery

## 2019-12-31 DIAGNOSIS — Z951 Presence of aortocoronary bypass graft: Secondary | ICD-10-CM

## 2020-01-03 ENCOUNTER — Other Ambulatory Visit: Payer: Self-pay | Admitting: Surgical

## 2020-01-03 ENCOUNTER — Other Ambulatory Visit: Payer: Self-pay

## 2020-01-03 ENCOUNTER — Ambulatory Visit
Admission: RE | Admit: 2020-01-03 | Discharge: 2020-01-03 | Disposition: A | Discharge status: Home / Self Care | Payer: 59 | Source: Ambulatory Visit | Attending: Cardiothoracic Surgery | Admitting: Cardiothoracic Surgery

## 2020-01-03 ENCOUNTER — Ambulatory Visit (INDEPENDENT_AMBULATORY_CARE_PROVIDER_SITE_OTHER): Payer: Self-pay | Admitting: Cardiothoracic Surgery

## 2020-01-03 VITALS — BP 111/72 | HR 85 | Temp 97.8°F | Resp 20 | Ht 70.5 in | Wt 172.0 lb

## 2020-01-03 DIAGNOSIS — Z951 Presence of aortocoronary bypass graft: Secondary | ICD-10-CM

## 2020-01-03 DIAGNOSIS — I2510 Atherosclerotic heart disease of native coronary artery without angina pectoris: Secondary | ICD-10-CM

## 2020-01-03 DIAGNOSIS — I5020 Unspecified systolic (congestive) heart failure: Secondary | ICD-10-CM

## 2020-01-03 NOTE — Progress Notes (Signed)
[address]       [place] [address]             [phone]     CARDIOTHORACIC SURGERY OFFICE NOTE  Referring Provider is Jonelle Sidle, MD Primary Cardiologist is Nona Dell, MD PCP is Kirstie Peri, MD   HPI:  59 yo man s/p CABG for low EF; managed with peri-operative Impella support. Ultimately weaned from Impella and discharged home. Has been doing well except for poor sleep hygiene and was recently diagnosed with COVID-19--tested due to + result in mother.  Denies SOB, chest pain, or palpitations.    Current Outpatient Medications  Medication Sig Dispense Refill  . amiodarone (PACERONE) 200 MG tablet Take 1 tablet (200 mg total) by mouth daily. 30 tablet 3  . aspirin 81 MG tablet Take 81 mg by mouth daily.    . carvedilol (COREG) 6.25 MG tablet Take 1 tablet (6.25 mg total) by mouth 2 (two) times daily with a meal. 60 tablet 1  . clopidogrel (PLAVIX) 75 MG tablet Take 1 tablet (75 mg total) by mouth daily. 30 tablet 1  . colchicine 0.6 MG tablet Take 1 tablet (0.6 mg total) by mouth daily. 30 tablet 1  . COMBIVENT RESPIMAT 20-100 MCG/ACT AERS respimat Inhale 1 puff into the lungs every 6 (six) hours as needed. 4 g 1  . digoxin (LANOXIN) 0.125 MG tablet Take 1 tablet (0.125 mg total) by mouth daily. 30 tablet 1  . furosemide (LASIX) 80 MG tablet Take 1 tablet (80 mg total) by mouth daily. 30 tablet 11  . glimepiride (AMARYL) 2 MG tablet Take 2 mg by mouth daily. Reported on 11/28/2015    . INVOKANA 100 MG TABS tablet Take 100 mg by mouth daily.  2  . magnesium oxide (MAG-OX) 400 (241.3 Mg) MG tablet Take 1 tablet (400 mg total) by mouth 2 (two) times daily. 60 tablet 0  . metFORMIN (GLUCOPHAGE) 500 MG tablet Take 1,000 mg by mouth 2 (two) times daily with a meal.    . Multiple Vitamin (MULTIVITAMIN WITH MINERALS) TABS tablet Take 1 tablet by mouth daily.    . potassium chloride SA (KLOR-CON) 20 MEQ tablet Take 1 tablet (20 mEq total) by mouth  daily. 30 tablet 0  . rosuvastatin (CRESTOR) 40 MG tablet Take 1 tablet (40 mg total) by mouth daily at 6 PM. 30 tablet 1  . sacubitril-valsartan (ENTRESTO) 24-26 MG Take 1 tablet by mouth 2 (two) times daily. 60 tablet 1  . spironolactone (ALDACTONE) 25 MG tablet Take 1 tablet (25 mg total) by mouth daily. 30 tablet 1  . SYMBICORT 160-4.5 MCG/ACT inhaler Inhale 2 puffs into the lungs 2 (two) times daily.     Marland Kitchen VICTOZA 18 MG/3ML SOPN Inject 1.2 Units into the skin daily.  2   Current Facility-Administered Medications  Medication Dose Route Frequency Provider Last Rate Last Admin  . traZODone (DESYREL) tablet 50 mg  50 mg Oral QHS [name], MD          Physical Exam:   BP 111/72   Pulse 85   Temp 97.8 F (36.6 C) (Skin)   Resp 20   Ht 5' 10.5" (1.791 m)   Wt 78 kg   SpO2 95% Comment: RA  BMI 24.33 kg/m   General:  Well-appearing, NAD  Chest:   cta  CV:   rrr  Incisions:  C/d/i  Abdomen:  sntnd  Extremities:  No edema  Diagnostic Tests:  cxr with very small left pleural effusion; otherwise clear lung fields   Impression:  Doing well after CABG for low EF  Plan:  F/u with heart failure clinic F/u with CT surgery as needed Ok to stop colchicine, amiodarone, mag ox, and multivitamins  I spent in excess of  15 minutes during the conduct of this office consultation and >50% of this time involved direct face-to-face encounter with the patient for counseling and/or coordination of their care.  Level 2                 10 minutes Level 3                 15 minutes Level 4                 25 minutes Level 5                 40 minutes  B. Murvin Natal, MD 01/03/2020 10:38 AM

## 2020-01-20 ENCOUNTER — Telehealth (HOSPITAL_COMMUNITY): Payer: Self-pay

## 2020-01-20 NOTE — Telephone Encounter (Signed)
Called patient regarding his referral to Cardiac Rehab S/P CABGx4. Spoke with his wife Gloris Manchester. She says he is not interested in participating in Cardiac Rehab. Referral cancelled.

## 2020-02-02 ENCOUNTER — Other Ambulatory Visit: Payer: Self-pay | Admitting: Surgical

## 2020-02-02 ENCOUNTER — Emergency Department (HOSPITAL_COMMUNITY)
Admission: EM | Admit: 2020-02-02 | Discharge: 2020-02-02 | Disposition: A | Discharge status: Home / Self Care | Payer: 59 | Attending: Emergency Medicine | Admitting: Emergency Medicine

## 2020-02-02 ENCOUNTER — Other Ambulatory Visit: Payer: Self-pay

## 2020-02-02 ENCOUNTER — Emergency Department (HOSPITAL_COMMUNITY): Payer: 59

## 2020-02-02 ENCOUNTER — Encounter (HOSPITAL_COMMUNITY): Payer: Self-pay | Admitting: Emergency Medicine

## 2020-02-02 DIAGNOSIS — Z7984 Long term (current) use of oral hypoglycemic drugs: Secondary | ICD-10-CM | POA: Diagnosis not present

## 2020-02-02 DIAGNOSIS — Z7982 Long term (current) use of aspirin: Secondary | ICD-10-CM | POA: Diagnosis not present

## 2020-02-02 DIAGNOSIS — F17210 Nicotine dependence, cigarettes, uncomplicated: Secondary | ICD-10-CM | POA: Diagnosis not present

## 2020-02-02 DIAGNOSIS — I10 Essential (primary) hypertension: Secondary | ICD-10-CM | POA: Diagnosis not present

## 2020-02-02 DIAGNOSIS — J449 Chronic obstructive pulmonary disease, unspecified: Secondary | ICD-10-CM | POA: Diagnosis not present

## 2020-02-02 DIAGNOSIS — Z79899 Other long term (current) drug therapy: Secondary | ICD-10-CM | POA: Diagnosis not present

## 2020-02-02 DIAGNOSIS — E119 Type 2 diabetes mellitus without complications: Secondary | ICD-10-CM | POA: Diagnosis not present

## 2020-02-02 DIAGNOSIS — R0789 Other chest pain: Secondary | ICD-10-CM | POA: Diagnosis present

## 2020-02-02 DIAGNOSIS — Z951 Presence of aortocoronary bypass graft: Secondary | ICD-10-CM | POA: Insufficient documentation

## 2020-02-02 LAB — BASIC METABOLIC PANEL
Anion gap: 11 (ref 5–15)
BUN: 33 mg/dL — ABNORMAL HIGH (ref 6–20)
CO2: 26 mmol/L (ref 22–32)
Calcium: 9.1 mg/dL (ref 8.9–10.3)
Chloride: 95 mmol/L — ABNORMAL LOW (ref 98–111)
Creatinine, Ser: 1.05 mg/dL (ref 0.61–1.24)
GFR calc Af Amer: >60 mL/min (ref >60)
GFR calc non Af Amer: >60 mL/min (ref >60)
Glucose, Bld: 260 mg/dL — ABNORMAL HIGH (ref 70–99)
Potassium: 4 mmol/L (ref 3.5–5.1)
Sodium: 132 mmol/L — ABNORMAL LOW (ref 135–145)

## 2020-02-02 LAB — CBC
HCT: 42.8 % (ref 39.0–52.0)
Hemoglobin: 14 g/dL (ref 13.0–17.0)
MCH: 28.5 pg (ref 26.0–34.0)
MCHC: 32.7 g/dL (ref 30.0–36.0)
MCV: 87 fL (ref 80.0–100.0)
Platelets: 200 10*3/uL (ref 150–400)
RBC: 4.92 MIL/uL (ref 4.22–5.81)
RDW: 14.3 % (ref 11.5–15.5)
WBC: 13.1 10*3/uL — ABNORMAL HIGH (ref 4.0–10.5)
nRBC: 0 % (ref 0.0–0.2)

## 2020-02-02 LAB — TROPONIN I (HIGH SENSITIVITY)
Troponin I (High Sensitivity): 14 ng/L (ref <18)
Troponin I (High Sensitivity): 13 ng/L (ref <18)

## 2020-02-02 MED ORDER — SODIUM CHLORIDE 0.9% FLUSH
3.0000 mL | Freq: Once | INTRAVENOUS | Status: AC
  Administered 2020-02-02: 13:00:00 3 mL via INTRAVENOUS

## 2020-02-02 NOTE — ED Provider Notes (Signed)
[facility] EMERGENCY DEPARTMENT Provider Note   CSN: [id number] Arrival date & time: 02/02/20  1206     History Chief Complaint  Patient presents with  . Chest Pain    Kevin Underwood is a 59 y.o. male.  Pt had bypass surgery on 11/16/2019.  Pt reports he went back to work and today he noticed soreness in his chest.  Pt thinks the pain is muscular from lifting but came in to get checked to be sure.   Pt denies any current pain,  He does have some soreness with movement.  No fever, no cough no congestion    Chest Pain      Past Medical History:  Diagnosis Date  . Arthritis   . COPD (chronic obstructive pulmonary disease) (HCC)   . Essential hypertension   . Type 2 diabetes mellitus [facility])     Patient Active Problem List   Diagnosis Date Noted  . Chest tube in place   . 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 Cardiogenic shock (HCC)   . Acute respiratory failure (HCC)   . RUQ abdominal pain   . Cardiomyopathy (HCC) 11/21/2019  . Acute systolic CHF (congestive heart failure) (HCC) 11/21/2019  . History of alcohol abuse Quit 6 mos ago 11/21/2019  . History of recreational drug use 11/21/2019  . PAF (paroxysmal atrial fibrillation) (HCC) 11/21/2019  . Cardiac volume overload 11/21/2019  . Elevated brain natriuretic peptide (BNP) level 11/21/2019  . Leg edema, right 11/21/2019  . Hyponatremia 11/21/2019  . Abnormal electrocardiogram (ECG) (EKG) 11/21/2019  . Hyperglycemia 11/21/2019  . Tremulousness 11/21/2019  . Restlessness and agitation 11/21/2019  . OSA (obstructive sleep apnea) 11/21/2019  . Atrial fibrillation with RVR (HCC) 11/20/2019  . Acute congestive heart failure (HCC) 11/20/2019  . Acute respiratory distress 11/20/2019  . COPD (chronic obstructive pulmonary disease) (HCC)   . Hypertension   . Type 2 diabetes mellitus (HCC)   . Closed fracture of 5th metacarpal 08/31/2012    Past Surgical History:  Procedure Laterality Date  . BACK SURGERY    . CLIPPING OF  ATRIAL APPENDAGE N/A 11/26/2019   Procedure: Clipping Of Atrial Appendage using AtriCure 40 Clip;  Surgeon: [name], MD;  Location: [facility] OR;  Service: Open Heart Surgery;  Laterality: N/A;  . CORONARY ARTERY BYPASS GRAFT N/A 11/26/2019   Procedure: CORONARY ARTERY BYPASS GRAFTING (CABG) using endoscopic greater saphenous vein harvest: svc to OM; svc to Diag; svc to PD; and LIMA to LAD.;  Surgeon: [name], MD;  Location: [facility] OR;  Service: Open Heart Surgery;  Laterality: N/A;  . FOOT SURGERY    . HAND SURGERY    . INTRAOPERATIVE TRANSESOPHAGEAL ECHOCARDIOGRAM  12/04/2019   Procedure: Intraoperative Transesophageal Echocardiogram;  Surgeon: [name], MD;  Location: [facility] OR;  Service: Open Heart Surgery;;  . MAZE N/A 11/26/2019   Procedure: MAZE using Bilateral Pulmonary Vein isolation.;  Surgeon: [name], MD;  Location: [facility] OR;  Service: Open Heart Surgery;  Laterality: N/A;  . PLACEMENT OF IMPELLA LEFT VENTRICULAR ASSIST DEVICE N/A 11/24/2019   Procedure: PLACEMENT OF IMPELLA 5.5 LEFT VENTRICULAR ASSIST DEVICE;  Surgeon: [name], MD;  Location: [facility] OR;  Service: Open Heart Surgery;  Laterality: N/A;  . REMOVAL OF IMPELLA LEFT VENTRICULAR ASSIST DEVICE Right 12/04/2019   Procedure: REMOVAL OF IMPELLA LEFT VENTRICULAR ASSIST DEVICE, right axilla;  Surgeon: [name], MD;  Location[facility] OR;  Service: Open Heart Surgery;  Laterality: Right;  .  RIGHT/LEFT HEART CATH AND CORONARY ANGIOGRAPHY N/A 11/24/2019   Procedure: RIGHT/LEFT HEART CATH AND CORONARY ANGIOGRAPHY;  Surgeon: Laurey Morale, MD;  Location: [facility] INVASIVE CV LAB;  Service: Cardiovascular;  Laterality: N/A;  . TEE WITHOUT CARDIOVERSION N/A 11/24/2019   Procedure: TRANSESOPHAGEAL ECHOCARDIOGRAM (TEE);  Surgeon: [name], MD;  Location: [facility] OR;  Service: Open Heart Surgery;  Laterality: N/A;  . TEE WITHOUT CARDIOVERSION N/A 11/26/2019   Procedure: TRANSESOPHAGEAL ECHOCARDIOGRAM (TEE);   Surgeon: [name], MD;  Location: [facility] OR;  Service: Open Heart Surgery;  Laterality: N/A;       Family History  Problem Relation Age of Onset  . Arthritis Other   . Lung disease Other   . Asthma Other   . Diabetes Other   . Heart disease Sister        Tumor?    Social History   Tobacco Use  . Smoking status: Current Every Day Smoker    Packs/day: 1.00    Years: 30.00    Pack years: 30.00    Types: Cigarettes  . Smokeless tobacco: Never Used  Substance Use Topics  . Alcohol use: Yes    Comment: Prior history of excessive intake  . Drug use: No    Home Medications Prior to Admission medications   Medication Sig Start Date End Date Taking? Authorizing Provider  amiodarone (PACERONE) 200 MG tablet Take 1 tablet (200 mg total) by mouth daily. 12/21/19  Yes Clegg, Amy D, NP  aspirin 81 MG tablet Take 81 mg by mouth daily.   Yes [provider]  carvedilol (COREG) 6.25 MG tablet Take 1 tablet (6.25 mg total) by mouth 2 (two) times daily with a meal. 12/08/19  Yes Gold, Wayne E, PA-C  clopidogrel (PLAVIX) 75 MG tablet Take 1 tablet (75 mg total) by mouth daily. 12/09/19  Yes Gold, Deniece Portela E, PA-C  colchicine 0.6 MG tablet Take 1 tablet (0.6 mg total) by mouth daily. 12/09/19  Yes Gold, Wayne E, PA-C  COMBIVENT RESPIMAT 20-100 MCG/ACT AERS respimat Inhale 1 puff into the lungs every 6 (six) hours as needed. 12/08/19  Yes Gold, Deniece Portela E, PA-C  digoxin (LANOXIN) 0.125 MG tablet Take 1 tablet (0.125 mg total) by mouth daily. 12/09/19  Yes Gold, Wayne E, PA-C  furosemide (LASIX) 80 MG tablet Take 1 tablet (80 mg total) by mouth daily. 12/13/19 12/12/20 Yes Atkins, Merri Brunette, MD  INVOKAMET XR [phone] MG TB24 Take 2 tablets by mouth daily. 01/17/20  Yes [provider]  metFORMIN (GLUCOPHAGE) 500 MG tablet Take 1,000 mg by mouth 2 (two) times daily with a meal.   Yes [provider]  Multiple Vitamin (MULTIVITAMIN WITH MINERALS) TABS tablet Take 1 tablet by mouth  daily. 12/09/19  Yes Gold, Wayne E, PA-C  rosuvastatin (CRESTOR) 40 MG tablet Take 1 tablet (40 mg total) by mouth daily at 6 PM. 12/08/19  Yes Gold, Wayne E, PA-C  sacubitril-valsartan (ENTRESTO) 24-26 MG Take 1 tablet by mouth 2 (two) times daily. 12/08/19  Yes Gold, Glenice Laine, PA-C  spironolactone (ALDACTONE) 25 MG tablet Take 1 tablet (25 mg total) by mouth daily. 12/09/19  Yes Gold, Wayne E, PA-C  SYMBICORT 160-4.5 MCG/ACT inhaler Inhale 2 puffs into the lungs 2 (two) times daily.  03/30/19  Yes [provider]  traZODone (DESYREL) 50 MG tablet Take 50 mg by mouth at bedtime.   Yes [provider]  glimepiride (AMARYL) 2 MG tablet Take 2 mg by mouth daily. Reported on  11/28/2015    [provider]  magnesium oxide (MAG-OX) 400 (241.3 Mg) MG tablet Take 1 tablet (400 mg total) by mouth 2 (two) times daily. Patient not taking: Reported on 02/02/2020 12/08/19   Jadene Pierini E, PA-C  potassium chloride SA (KLOR-CON) 20 MEQ tablet Take 1 tablet (20 mEq total) by mouth daily. Patient not taking: Reported on 02/02/2020 12/08/19   Jadene Pierini E, PA-C  VICTOZA 18 MG/3ML SOPN Inject 1.2 Units into the skin daily. 09/05/15   [provider]    Allergies    Patient has no known allergies.  Review of Systems   Review of Systems  Cardiovascular: Positive for chest pain.  All other systems reviewed and are negative.   Physical Exam Updated Vital Signs BP 123/71   Pulse 84   Temp 97.6 F (36.4 C) (Oral)   Resp (!) 21   Ht 5\' 10"  (1.778 m)   Wt 74.8 kg   SpO2 99%   BMI 23.68 kg/m   Physical Exam Vitals and nursing note reviewed.  Constitutional:      Appearance: He is well-developed.  HENT:     Head: Normocephalic and atraumatic.  Eyes:     Conjunctiva/sclera: Conjunctivae normal.  Cardiovascular:     Rate and Rhythm: Normal rate and regular rhythm.     Heart sounds: Normal heart sounds. No murmur.  Pulmonary:     Effort: Pulmonary effort is normal. No  respiratory distress.     Breath sounds: Normal breath sounds.  Abdominal:     Palpations: Abdomen is soft.     Tenderness: There is no abdominal tenderness.  Musculoskeletal:        General: Normal range of motion.     Cervical back: Neck supple.  Skin:    General: Skin is warm and dry.  Neurological:     General: No focal deficit present.     Mental Status: He is alert.     ED Results / Procedures / Treatments   Labs (all labs ordered are listed, but only abnormal results are displayed) Labs Reviewed  BASIC METABOLIC PANEL - Abnormal; Notable for the following components:      Result Value   Sodium 132 (*)    Chloride 95 (*)    Glucose, Bld 260 (*)    BUN 33 (*)    All other components within normal limits  CBC - Abnormal; Notable for the following components:   WBC 13.1 (*)    All other components within normal limits  TROPONIN I (HIGH SENSITIVITY)  TROPONIN I (HIGH SENSITIVITY)    EKG EKG Interpretation  Date/Time:  Wednesday February 02 2020 12:18:19 EST Ventricular Rate:  82 PR Interval:    QRS Duration: 91 QT Interval:  401 QTC Calculation: 469 R Axis:   79 Text Interpretation: Sinus rhythm Atrial premature complexes Non-specific ST-t changes No significant change since last tracing Confirmed by Lajean Saver [phone]) on 02/02/2020 12:29:36 PM   Radiology DG Chest 2 View  Result Date: 02/02/2020 CLINICAL DATA:  Chest pain EXAM: CHEST - 2 VIEW COMPARISON:  01/03/2020 FINDINGS: No new consolidation or edema. Improved aeration of the left lung base and resolution of bilateral pleural effusions. Stable cardiomediastinal contours. Left atrial appendage clip is again noted. No acute osseous abnormality. IMPRESSION: No acute process in the chest. Electronically Signed   By: Macy Mis M.D.   On: 02/02/2020 13:33    Procedures Procedures (including critical care time)  Medications Ordered in ED Medications  sodium chloride flush (NS) 0.9 % injection 3 mL (3  mLs Intravenous Given 02/02/20 1305)    ED Course  I have reviewed the triage vital signs and the nursing notes.  Pertinent labs & imaging results that were available during my care of the patient were reviewed by me and considered in my medical decision making (see chart for details).    MDM Rules/Calculators/A&P                      MDM:  I suspect chest pain is muscular.  I spoke with cardiology on call who will have office schedule followup.  Pt did not do cardiac rehab.  I advised this would probably be beneficial  Final Clinical Impression(s) / ED Diagnoses Final diagnoses:  Atypical chest pain    Rx / DC Orders ED Discharge Orders    None    An After Visit Summary was printed and given to the patient.    Osie Cheeks 02/02/20 2026    Vanetta Mulders, MD 02/04/20 [phone]

## 2020-02-02 NOTE — Discharge Instructions (Addendum)
Schedule to see your cardiologist for evaluation.  Consider going into cardiac rehab.

## 2020-02-02 NOTE — ED Triage Notes (Signed)
Patient reports chest pain that started last night. Reports SOB and bilateral chest pain. Patient states he thinks he might have pulled some muscles climbing into a machine this am. Patient states his pain started last night while he was sitting. Denies nausea or vomiting, no diaphoresis.

## 2020-02-03 ENCOUNTER — Other Ambulatory Visit: Payer: Self-pay | Admitting: Cardiothoracic Surgery

## 2020-02-03 ENCOUNTER — Telehealth: Payer: Self-pay

## 2020-02-03 ENCOUNTER — Other Ambulatory Visit: Payer: Self-pay

## 2020-02-03 DIAGNOSIS — I5021 Acute systolic (congestive) heart failure: Secondary | ICD-10-CM

## 2020-02-03 DIAGNOSIS — Z951 Presence of aortocoronary bypass graft: Secondary | ICD-10-CM

## 2020-02-03 MED ORDER — TRAMADOL HCL 50 MG PO TABS: 50.0000 mg | ORAL_TABLET | Freq: Four times a day (QID) | ORAL | 0 refills | Status: DC | PRN

## 2020-02-03 MED ORDER — CEPHALEXIN 500 MG PO CAPS: 500.0000 mg | ORAL_CAPSULE | Freq: Three times a day (TID) | ORAL | 0 refills | Status: AC

## 2020-02-03 NOTE — Telephone Encounter (Signed)
Pt's wife called this morning to report that pt has severe chest "soreness"; went to the ED for this yesterday. CXR and troponin were normal. S/p CABG by Dr. Vickey Sages on 11/26/19. Tramadol was filled 12/08/19 upon hospital discharge. Wife states pt has had significant pain since surgery, but it's increased since bearing weight on his arm to get up from a chair a few days ago. Wife says he had redness and pain from "collar bone to collar bone across his chest" after this. Redness has decreased; however, pain is so intense that "he can barely move." Reports pt has been taking ibuprofen w/o relief. Dr. Vickey Sages informed of all of the above information via staff message. New orders to schedule an office visit on Monday following a Chest CT w/o contrast, in addition to Keflex 500 mg tid x 5 days and a refill of Tramadol 50 mg q 4-6 hrs prn pain (#28 w/ no refills). Prescriptions called to [facility] in [place]. TC to pt's wife to notify of new prescriptions and plan for appointments next week. Instructed to call [facility] prn and to seek immediate medical attention for shortness of breath and/or acute pain.

## 2020-02-07 ENCOUNTER — Ambulatory Visit: Payer: 59 | Admitting: Cardiothoracic Surgery

## 2020-02-13 LAB — ECHO INTRAOPERATIVE TEE
AV Mean grad: 1 mmHg
Ao-prox: 3.1
Height: 70 in
LVOT diameter: 22 mm
STJ: 2.8 cm
Sinus: 3.6 cm
Weight: 3008 oz

## 2020-02-15 ENCOUNTER — Other Ambulatory Visit: Payer: Self-pay

## 2020-02-15 ENCOUNTER — Ambulatory Visit
Admission: RE | Admit: 2020-02-15 | Discharge: 2020-02-15 | Disposition: A | Discharge status: Home / Self Care | Payer: 59 | Source: Ambulatory Visit | Attending: Cardiothoracic Surgery | Admitting: Cardiothoracic Surgery

## 2020-02-15 ENCOUNTER — Ambulatory Visit (INDEPENDENT_AMBULATORY_CARE_PROVIDER_SITE_OTHER): Payer: Self-pay | Admitting: Cardiothoracic Surgery

## 2020-02-15 ENCOUNTER — Other Ambulatory Visit: Payer: 59

## 2020-02-15 VITALS — BP 134/86 | HR 101 | Temp 98.1°F | Resp 20 | Ht 70.0 in | Wt 166.2 lb

## 2020-02-15 DIAGNOSIS — Z951 Presence of aortocoronary bypass graft: Secondary | ICD-10-CM

## 2020-02-15 MED ORDER — GABAPENTIN 100 MG PO CAPS: 100.0000 mg | ORAL_CAPSULE | Freq: Three times a day (TID) | ORAL | 6 refills | Status: DC

## 2020-02-17 NOTE — Progress Notes (Signed)
[address]       [place] [address]             [phone]     CARDIOTHORACIC SURGERY OFFICE NOTE  Referring Provider is Satira Sark, MD Primary Cardiologist is Rozann Lesches, MD PCP is Monico Blitz, MD   HPI:  59 yo man s/p CABg 11/26/19 as Shelly Bombard case due to acutely decompensated HF with multivessel CAD. He did well and has been discharged from this clinic, but recently had some minor trauma during a fall at home and reports erythema across upper chest. He also experienced increased pain afterwards. Denies sternal clicking. Denies f/c or wound drainage.    Current Outpatient Medications  Medication Sig Dispense Refill  . amiodarone (PACERONE) 200 MG tablet Take 1 tablet (200 mg total) by mouth daily. 30 tablet 3  . aspirin 81 MG tablet Take 81 mg by mouth daily.    . carvedilol (COREG) 6.25 MG tablet Take 1 tablet (6.25 mg total) by mouth 2 (two) times daily with a meal. 60 tablet 1  . clopidogrel (PLAVIX) 75 MG tablet Take 1 tablet (75 mg total) by mouth daily. 30 tablet 1  . colchicine 0.6 MG tablet Take 1 tablet (0.6 mg total) by mouth daily. 30 tablet 1  . COMBIVENT RESPIMAT 20-100 MCG/ACT AERS respimat Inhale 1 puff into the lungs every 6 (six) hours as needed. 4 g 1  . digoxin (LANOXIN) 0.125 MG tablet Take 1 tablet (0.125 mg total) by mouth daily. 30 tablet 1  . furosemide (LASIX) 80 MG tablet Take 1 tablet (80 mg total) by mouth daily. 30 tablet 11  . glimepiride (AMARYL) 2 MG tablet Take 2 mg by mouth daily. Reported on 11/28/2015    . INVOKAMET XR [phone] MG TB24 Take 2 tablets by mouth daily.    . magnesium oxide (MAG-OX) 400 (241.3 Mg) MG tablet Take 1 tablet (400 mg total) by mouth 2 (two) times daily. 60 tablet 0  . metFORMIN (GLUCOPHAGE) 500 MG tablet Take 1,000 mg by mouth 2 (two) times daily with a meal.    . Multiple Vitamin (MULTIVITAMIN WITH MINERALS) TABS tablet Take 1 tablet by mouth daily.    . potassium chloride SA  (KLOR-CON) 20 MEQ tablet Take 1 tablet (20 mEq total) by mouth daily. 30 tablet 0  . rosuvastatin (CRESTOR) 40 MG tablet Take 1 tablet (40 mg total) by mouth daily at 6 PM. 30 tablet 1  . sacubitril-valsartan (ENTRESTO) 24-26 MG Take 1 tablet by mouth 2 (two) times daily. 60 tablet 1  . spironolactone (ALDACTONE) 25 MG tablet Take 1 tablet (25 mg total) by mouth daily. 30 tablet 1  . SYMBICORT 160-4.5 MCG/ACT inhaler Inhale 2 puffs into the lungs 2 (two) times daily.     . traMADol (ULTRAM) 50 MG tablet Take 1 tablet (50 mg total) by mouth every 6 (six) hours as needed for up to 28 doses. 28 tablet 0  . traZODone (DESYREL) 50 MG tablet Take 50 mg by mouth at bedtime.    Marland Kitchen VICTOZA 18 MG/3ML SOPN Inject 1.2 Units into the skin daily.  2  . gabapentin (NEURONTIN) 100 MG capsule Take 1 capsule (100 mg total) by mouth 3 (three) times daily. 90 capsule 6   Current Facility-Administered Medications  Medication Dose Route Frequency Provider Last Rate Last Admin  . traZODone (DESYREL) tablet 50 mg  50 mg Oral QHS Wonda Olds, MD  Physical Exam:   BP 134/86 (BP Location: Left Arm, Patient Position: Sitting, Cuff Size: Normal)   Pulse (!) 101   Temp 98.1 F (36.7 C) (Temporal)   Resp 20   Ht 5\' 10"  (1.778 m)   Wt 75.4 kg   SpO2 97% Comment: RA  BMI 23.85 kg/m   General:  Well-appearing, NAD  Chest:   Cta; sternum stable  CV:   rrr  Incisions:  Well-healed    Diagnostic Tests:  I have reviewed available imaging and agree with interpretation. No evidence on CT scan for sternal disruption or nonunion.    Impression:  59 yo man s/p CABG for low EF. He is doing well overall. There is no evidence for sternal disruption or difficulty healing. He may be experiencing some degree of chronic pain made worse by mild trauma or stress on upper chest.  Plan:  F/u as needed Prescription given for gabapentin 100 mg po TID. This will likely need to be increased over the next couple of  weeks gradually in order for him to realize significant improvement in pain levels  I spent in excess of 20 minutes during the conduct of this office consultation and >50% of this time involved direct face-to-face encounter with the patient for counseling and/or coordination of their care.  Level 2                 10 minutes Level 3                 15 minutes Level 4                 25 minutes Level 5                 40 minutes  B. 46, MD 02/17/2020 2:08 PM

## 2020-02-21 ENCOUNTER — Other Ambulatory Visit: Payer: Self-pay | Admitting: Surgical

## 2020-02-23 ENCOUNTER — Other Ambulatory Visit: Payer: Self-pay | Admitting: *Deleted

## 2020-02-23 ENCOUNTER — Ambulatory Visit: Payer: 59 | Admitting: Student

## 2020-02-23 DIAGNOSIS — M792 Neuralgia and neuritis, unspecified: Secondary | ICD-10-CM

## 2020-02-23 DIAGNOSIS — G8918 Other acute postprocedural pain: Secondary | ICD-10-CM

## 2020-02-23 MED ORDER — GABAPENTIN 100 MG PO CAPS: 300.0000 mg | ORAL_CAPSULE | Freq: Three times a day (TID) | ORAL | 2 refills | Status: DC

## 2020-02-23 MED ORDER — TRAMADOL HCL 50 MG PO TABS: 50.0000 mg | ORAL_TABLET | Freq: Four times a day (QID) | ORAL | 0 refills | Status: AC | PRN

## 2020-03-03 ENCOUNTER — Other Ambulatory Visit: Payer: Self-pay | Admitting: Surgical

## 2020-03-21 ENCOUNTER — Encounter (HOSPITAL_COMMUNITY): Payer: 59

## 2020-03-30 ENCOUNTER — Other Ambulatory Visit: Payer: Self-pay | Admitting: Surgical

## 2020-04-10 ENCOUNTER — Encounter (HOSPITAL_COMMUNITY): Payer: 59

## 2020-05-02 ENCOUNTER — Other Ambulatory Visit: Payer: Self-pay | Admitting: Cardiothoracic Surgery

## 2020-05-24 ENCOUNTER — Other Ambulatory Visit: Payer: Self-pay

## 2020-05-24 ENCOUNTER — Ambulatory Visit (INDEPENDENT_AMBULATORY_CARE_PROVIDER_SITE_OTHER): Payer: 59 | Admitting: Family Medicine

## 2020-05-24 ENCOUNTER — Encounter: Payer: Self-pay | Admitting: Family Medicine

## 2020-05-24 VITALS — BP 128/70 | HR 68 | Ht 70.0 in | Wt 173.2 lb

## 2020-05-24 DIAGNOSIS — J449 Chronic obstructive pulmonary disease, unspecified: Secondary | ICD-10-CM

## 2020-05-24 DIAGNOSIS — I255 Ischemic cardiomyopathy: Secondary | ICD-10-CM

## 2020-05-24 DIAGNOSIS — I2510 Atherosclerotic heart disease of native coronary artery without angina pectoris: Secondary | ICD-10-CM

## 2020-05-24 DIAGNOSIS — G4733 Obstructive sleep apnea (adult) (pediatric): Secondary | ICD-10-CM

## 2020-05-24 DIAGNOSIS — I10 Essential (primary) hypertension: Secondary | ICD-10-CM | POA: Diagnosis not present

## 2020-05-24 DIAGNOSIS — I5022 Chronic systolic (congestive) heart failure: Secondary | ICD-10-CM

## 2020-05-24 DIAGNOSIS — I480 Paroxysmal atrial fibrillation: Secondary | ICD-10-CM

## 2020-05-24 DIAGNOSIS — E1165 Type 2 diabetes mellitus with hyperglycemia: Secondary | ICD-10-CM

## 2020-05-24 MED ORDER — DIGOXIN 125 MCG PO TABS: 0.1250 mg | ORAL_TABLET | Freq: Every day | ORAL | 3 refills | Status: DC

## 2020-05-24 MED ORDER — SACUBITRIL-VALSARTAN 24-26 MG PO TABS: 1.0000 | ORAL_TABLET | Freq: Two times a day (BID) | ORAL | 3 refills | Status: DC

## 2020-05-24 MED ORDER — CLOPIDOGREL BISULFATE 75 MG PO TABS: 75.0000 mg | ORAL_TABLET | Freq: Every day | ORAL | 3 refills | Status: DC

## 2020-05-24 MED ORDER — CARVEDILOL 6.25 MG PO TABS: 6.2500 mg | ORAL_TABLET | Freq: Two times a day (BID) | ORAL | 3 refills | Status: DC

## 2020-05-24 NOTE — Patient Instructions (Addendum)
Medication Instructions:   Your physician has recommended you make the following change in your medication:   Please restart your carvedilol, clopidogrel, entresto and digoxin.  Continue other medications the same  Labwork:  Your physician recommends that you return for non-fating lab work in: 2 weeks to check your BMET and Magnesium levels. This may be done at [facility] or [employer] or [employer] ([address] [place]) Monday-Friday from 8:00 am - 4:00 pm. No appointment is needed.  Testing/Procedures: Your physician has requested that you have an echocardiogram in September 2021. Echocardiography is a painless test that uses sound waves to create images of your heart. It provides your doctor with information about the size and shape of your heart and how well your heart's chambers and valves are working. This procedure takes approximately one hour. There are no restrictions for this procedure.  Follow-Up:  Your physician recommends that you schedule a follow-up appointment in: 3 months (office).  Any Other Special Instructions Will Be Listed Below (If Applicable).  You have been referred to [facility].   If you need a refill on your cardiac medications before your next appointment, please call your pharmacy.

## 2020-05-24 NOTE — Progress Notes (Addendum)
Cardiology Office Note  Date: 05/24/2020   ID: Kevin Underwood, DOB Mar 26, 1961, MRN [id number]  PCP:  Kirstie Peri, MD  Cardiologist:  Nona Dell, MD Electrophysiologist:  None   Chief Complaint: F/U CAD S/P CABG x 4, HFrEF, PAF, Cardiomyopathy  History of Present Illness: Kevin Underwood is a 59 y.o. male with a history of CAD status post CABG December 2020, HFrEF, PAF, cardiomyopathy, COPD, HTN, type 2 diabetes, substance abuse, OSA. NSTEMI with Impella LVAD insertion.   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 SVG-OM, SVG-Diag, SVG-PD, LIMA-LAD with  MAZE and LAA clipping.  Echo 12/05/2019 LVEF 20 to 25%, abnormal septal motion consistent with postoperative status, grade 2 DD, moderately dilated LV internal cavity size, LV global hypokinesis, global RV moderately reduced systolic function. RV size moderately enlarged, LA moderately dilated, mild to moderate MR, mild elevation PASP at 31.5 mmHg  Patient is here today for follow-up.  Patient states he has not apparently been on medication that he should have been on including carvedilol, clopidogrel, digoxin, Entresto.  States his only issue is feeling fatigued.  States he had some issues with his chest and median sternotomy with complaints of burning and pain previously and was started on gabapentin for treatment.  He denies any progressive anginal or exertional symptoms, palpitations or arrhythmias, orthostatic symptoms, CVA or TIA-like symptoms, bleeding issues, lower extremity edema, orthopnea, PND.  States he has stopped smoking for approximately 6 months.   Past Medical History:  Diagnosis Date  . Arthritis   . COPD (chronic obstructive pulmonary disease) (HCC)   . Essential hypertension   . Type 2 diabetes mellitus (HCC)     Past Surgical History:  Procedure Laterality Date  . BACK SURGERY    . CLIPPING OF ATRIAL APPENDAGE N/A 11/26/2019   Procedure: Clipping Of Atrial Appendage using AtriCure 40 Clip;  Surgeon: [name],  MD;  Location: [facility] OR;  Service: Open Heart Surgery;  Laterality: N/A;  . CORONARY ARTERY BYPASS GRAFT N/A 11/26/2019   Procedure: CORONARY ARTERY BYPASS GRAFTING (CABG) using endoscopic greater saphenous vein harvest: svc to OM; svc to Diag; svc to PD; and LIMA to LAD.;  Surgeon: [name], MD;  Location: [facility] OR;  Service: Open Heart Surgery;  Laterality: N/A;  . FOOT SURGERY    . HAND SURGERY    . INTRAOPERATIVE TRANSESOPHAGEAL ECHOCARDIOGRAM  12/04/2019   Procedure: Intraoperative Transesophageal Echocardiogram;  Surgeon: [name], MD;  Location: [facility] OR;  Service: Open Heart Surgery;;  . MAZE N/A 11/26/2019   Procedure: MAZE using Bilateral Pulmonary Vein isolation.;  Surgeon: [name], MD;  Location: [facility] OR;  Service: Open Heart Surgery;  Laterality: N/A;  . PLACEMENT OF IMPELLA LEFT VENTRICULAR ASSIST DEVICE N/A 11/24/2019   Procedure: PLACEMENT OF IMPELLA 5.5 LEFT VENTRICULAR ASSIST DEVICE;  Surgeon: [name], MD;  Location: [facility] OR;  Service: Open Heart Surgery;  Laterality: N/A;  . REMOVAL OF IMPELLA LEFT VENTRICULAR ASSIST DEVICE Right 12/04/2019   Procedure: REMOVAL OF IMPELLA LEFT VENTRICULAR ASSIST DEVICE, right axilla;  Surgeon: [name], MD;  Location: [facility] OR;  Service: Open Heart Surgery;  Laterality: Right;  . RIGHT/LEFT HEART CATH AND CORONARY ANGIOGRAPHY N/A 11/24/2019   Procedure: RIGHT/LEFT HEART CATH AND CORONARY ANGIOGRAPHY;  Surgeon: Laurey Morale, MD;  Location: [facility] INVASIVE CV LAB;  Service: Cardiovascular;  Laterality: N/A;  . TEE WITHOUT CARDIOVERSION N/A 11/24/2019   Procedure: TRANSESOPHAGEAL ECHOCARDIOGRAM (TEE);  Surgeon: [name], MD;  Location:  [facility] OR;  Service: Open Heart Surgery;  Laterality: N/A;  . TEE WITHOUT CARDIOVERSION N/A 11/26/2019   Procedure: TRANSESOPHAGEAL ECHOCARDIOGRAM (TEE);  Surgeon: Wonda Olds, MD;  Location: [facility];  Service: Open Heart Surgery;  Laterality: N/A;    Current Outpatient  Medications  Medication Sig Dispense Refill  . amiodarone (PACERONE) 200 MG tablet Take 1 tablet (200 mg total) by mouth daily. 30 tablet 3  . aspirin 81 MG tablet Take 81 mg by mouth daily.    . colchicine 0.6 MG tablet Take 1 tablet (0.6 mg total) by mouth daily. 30 tablet 1  . COMBIVENT RESPIMAT 20-100 MCG/ACT AERS respimat Inhale 1 puff into the lungs every 6 (six) hours as needed. 4 g 1  . furosemide (LASIX) 80 MG tablet Take 1 tablet (80 mg total) by mouth daily. 30 tablet 11  . glimepiride (AMARYL) 2 MG tablet Take 2 mg by mouth daily. Reported on 11/28/2015    . INVOKAMET XR [id number] MG TB24 Take 2 tablets by mouth daily.    . magnesium oxide (MAG-OX) 400 (241.3 Mg) MG tablet Take 1 tablet (400 mg total) by mouth 2 (two) times daily. 60 tablet 0  . metFORMIN (GLUCOPHAGE) 500 MG tablet Take 1,000 mg by mouth 2 (two) times daily with a meal.    . Multiple Vitamin (MULTIVITAMIN WITH MINERALS) TABS tablet Take 1 tablet by mouth daily.    . potassium chloride SA (KLOR-CON) 20 MEQ tablet Take 1 tablet (20 mEq total) by mouth daily. 30 tablet 0  . rosuvastatin (CRESTOR) 40 MG tablet TAKE 1 TABLET(40 MG) BY MOUTH DAILY AT 6 PM 30 tablet 1  . spironolactone (ALDACTONE) 25 MG tablet Take 1 tablet (25 mg total) by mouth daily. 30 tablet 1  . SYMBICORT 160-4.5 MCG/ACT inhaler Inhale 2 puffs into the lungs 2 (two) times daily.     . traZODone (DESYREL) 50 MG tablet Take 50 mg by mouth at bedtime.    Marland Kitchen VICTOZA 18 MG/3ML SOPN Inject 1.2 Units into the skin daily.  2  . carvedilol (COREG) 6.25 MG tablet Take 1 tablet (6.25 mg total) by mouth 2 (two) times daily with a meal. (Patient not taking: Reported on 05/24/2020) 60 tablet 1  . clopidogrel (PLAVIX) 75 MG tablet Take 1 tablet (75 mg total) by mouth daily. (Patient not taking: Reported on 05/24/2020) 30 tablet 1  . digoxin (LANOXIN) 0.125 MG tablet Take 1 tablet (0.125 mg total) by mouth daily. (Patient not taking: Reported on 05/24/2020) 30 tablet 1  .  sacubitril-valsartan (ENTRESTO) 24-26 MG Take 1 tablet by mouth 2 (two) times daily. (Patient not taking: Reported on 05/24/2020) 60 tablet 1   Current Facility-Administered Medications  Medication Dose Route Frequency Provider Last Rate Last Admin  . traZODone (DESYREL) tablet 50 mg  50 mg Oral QHS Atkins, Glenice Bow, MD       Allergies:  Patient has no known allergies.   Social History: The patient  reports that he quit smoking about 5 months ago. His smoking use included cigarettes. He has a 30.00 pack-year smoking history. He has never used smokeless tobacco. He reports current alcohol use. He reports that he does not use drugs.   Family History: The patient's family history includes Arthritis in an other family member; Asthma in an other family member; Diabetes in an other family member; Heart disease in his sister; Lung disease in an other family member.   ROS:  Please see the history of present illness. Otherwise,  complete review of systems is positive for none.  All other systems are reviewed and negative.   Physical Exam: VS:  BP 128/70   Pulse 68   Ht 5\' 10"  (1.778 m)   Wt 173 lb 3.2 oz (78.6 kg)   SpO2 97%   BMI 24.85 kg/m , BMI Body mass index is 24.85 kg/m.  Wt Readings from Last 3 Encounters:  05/24/20 173 lb 3.2 oz (78.6 kg)  02/15/20 166 lb 3.2 oz (75.4 kg)  02/02/20 165 lb (74.8 kg)    General: Patient appears comfortable at rest. Neck: Supple, no elevated JVP or carotid bruits, no thyromegaly. Lungs: Clear to auscultation, nonlabored breathing at rest. Cardiac: Regular rate and rhythm, no S3 or significant systolic murmur, no pericardial rub. Extremities: No pitting edema, distal pulses 2+. Skin: Warm and dry.  Median sternotomy scar and chest tube insertion sites are clean and dry without redness or swelling. Musculoskeletal: No kyphosis. Neuropsychiatric: Alert and oriented x3, affect grossly appropriate.  ECG:  An ECG dated 05/24/2020 was personally reviewed  today and demonstrated:  Sinus rhythm rate of 92, right atrial enlargement, minimal voltage criteria for LVH, T wave abnormality consider lateral ischemia, prolonged QT  Recent Labwork: 11/20/2019: TSH 1.658 11/25/2019: B Natriuretic Peptide 596.1 11/26/2019: ALT 31; AST 50 12/08/2019: Magnesium 1.5 02/02/2020: BUN 33; Creatinine, Ser 1.05; Hemoglobin 14.0; Platelets 200; Potassium 4.0; Sodium 132     Component Value Date/Time   CHOL 175 11/21/2019 0514   TRIG 90 11/29/2019 1702   HDL 45 11/21/2019 0514   CHOLHDL 3.9 11/21/2019 0514   VLDL 14 11/21/2019 0514   LDLCALC 116 (H) 11/21/2019 0514    Other Studies Reviewed Today:  Echocardiogram 12/05/2019  1. Left ventricular ejection fraction, by visual estimation, is 20 to 25%. The left ventricle has severely decreased function. There is no increased left ventricular wall thickness. 2. Abnormal septal motion consistent with post-operative status. 3. Elevated left atrial pressure. 4. Left ventricular diastolic parameters are consistent with Grade II diastolic dysfunction (pseudonormalization). 5. Moderately dilated left ventricular internal cavity size. 6. The left ventricle demonstrates global hypokinesis. 7. Global right ventricle has moderately reduced systolic function.The right ventricular size is moderately enlarged. no increase in right ventricular wall thickness. 8. Left atrial size was moderately dilated. 9. The mitral valve is normal in structure. There is mild to moderate mitral valve regurgitation. No evidence of mitral stenosis. 10. The tricuspid valve was normal in structure. Tricuspid valve regurgitation is not demonstrated. 11. Tricuspid valve regurgitation is not demonstrated. 12. No evidence of aortic valve sclerosis or stenosis. 13. The pulmonic valve was normal in structure. 14. Mildly elevated pulmonary artery systolic pressure. 15. The tricuspid regurgitant velocity is 2.67 m/s, and with an assumed right  atrial pressure of 3 mmHg, the estimated right ventricular systolic pressure is mildly elevated at 31.5 mmHg. (may be higher; inferior vena cava could not be visualized  Conclusion  1. Low output HF with elevated filling pressures.  2. Severe 3 vessel disease with heavily calcified and severely stenotic proximal LAD.    I will increase IV Lasix and start him on milrinone 0.25 to promote diuresis.   Diabetic with 3VD including proximal LAD. He will need cardiac surgery evaluation.  If not taken for surgery, PCI would be an option (LAD lesion would require atherectomy).   Details of Cathetization Left Main  30% distal LM stenosis.  Left Anterior Descending  Heavily calcified proximal and mid LAD. There is 95% stenosis in the proximal  LAD just after take-off of a high D1. D1 has moderate diffuse disease.  Left Circumflex  Long 80% mid LCx stenosis proximal to PLOM.  Right Coronary Artery  RCA with 50% proximal stenosis, 30% mid stenosis. PDA with 75% proximal stenosis    Assessment and Plan:  1. CAD in native artery   2. Ischemic cardiomyopathy   3. Chronic systolic heart failure (HCC)   4. Essential hypertension   5. Paroxysmal atrial fibrillation (HCC)   6. OSA (obstructive sleep apnea)   7. Chronic obstructive pulmonary disease, unspecified COPD type (HCC)   8. Type 2 diabetes mellitus with hyperglycemia, unspecified whether long term insulin use (HCC)    1. CAD in native artery S/P CABG 11/26/2019 SVG-OM, SVG-Diag, SVG-PD, LIMA-LAD.  Continue aspirin 81 mg.  Restart clopidogrel 75 mg.  Continue Crestor 40 mg daily.  Apparently patient had not been taking these medications since discharge from hospital.  He is unsure of the reason.  No current anginal or exertional symptoms.  I was very emphatic with patient about no further use of recreational substances or alcohol.  Patient verbalizes understanding.  Patient states he has stopped smoking approximately 6 months and has no plans  to restart.  2. Ischemic cardiomyopathy Echo EF 20-25%..  We will get a repeat echocardiogram in 3 months to assess LV function.  3. Chronic systolic heart failure (HCC) Biverntricular failure on impella LVAD during hospital stay for CABG.  Patient states there was some confusion about his medications and he has not been taking several of them since discharge from the hospital.  Restart carvedilol 6.25 mg p.o. twice daily, restart digoxin 0.125 mg daily.  Restart Entresto 24/26 mg p.o. twice daily.  Continue spironolactone 25 mg daily.  Continue Lasix 80 mg daily.  Recheck BMP and magnesium 2 weeks after restarting Entresto.  4. Essential hypertension Blood pressure 138/80.  Continue carvedilol 6.25 mg p.o. twice daily.  Furosemide 80 mg daily spironolactone 25 mg daily  5. Paroxysmal atrial fibrillation (HCC) AF with RVR on admission, converted to NSR. S/P Maze procedure. Remained in NSR.  EKG today shows normal sinus rhythm with a rate of 92.  Eliquis if AF recurs.   Continue amiodarone 200 mg daily   6. OSA (obstructive sleep apnea) Strongly suspected . Needs sleep study.  Will refer to [facility] pulmonology for evaluation for OSA  7. Chronic obstructive pulmonary disease, unspecified COPD type (HCC) Evidence of pulmonary HTN . Suspect group 2 + group 3 (COPD) evidence of Emphysema on CT chest.  Patient admits long history of smoking.  Patient states he quit 6 months ago and has no plans to restart.  8. Type 2 diabetes mellitus with hyperglycemia, unspecified whether long term insulin use (HCC) Poorly controlled per internal medicine. Eventually would benefit from SGLT2 inhibitor.  Managed by PCP  Medication Adjustments/Labs and Tests Ordered: Current medicines are reviewed at length with the patient today.  Concerns regarding medicines are outlined above.   Disposition: Follow-up with Dr. Diona Browner or APP 3 months  Signed, Rennis Harding, NP 05/24/2020 8:39 AM    [facility] [address] [place], [place], [place] [address] Phone: [phone]; Fax: [phone]

## 2020-06-16 ENCOUNTER — Other Ambulatory Visit: Payer: Self-pay | Admitting: Cardiothoracic Surgery

## 2020-06-26 ENCOUNTER — Other Ambulatory Visit (HOSPITAL_COMMUNITY): Payer: Self-pay | Admitting: Adult Health

## 2020-06-27 ENCOUNTER — Telehealth: Payer: Self-pay | Admitting: *Deleted

## 2020-07-14 ENCOUNTER — Encounter: Payer: Self-pay | Admitting: *Deleted

## 2020-07-14 NOTE — Telephone Encounter (Signed)
Spoke with mom-says patient did lab work at [employer] work requested.

## 2020-08-09 ENCOUNTER — Other Ambulatory Visit: Payer: 59

## 2020-08-24 ENCOUNTER — Ambulatory Visit: Payer: 59 | Admitting: Cardiology

## 2020-08-24 ENCOUNTER — Other Ambulatory Visit: Payer: 59

## 2020-09-20 ENCOUNTER — Encounter: Payer: Self-pay | Admitting: Family Medicine

## 2020-09-21 ENCOUNTER — Telehealth: Payer: Self-pay | Admitting: Cardiology

## 2020-09-21 ENCOUNTER — Ambulatory Visit (INDEPENDENT_AMBULATORY_CARE_PROVIDER_SITE_OTHER): Payer: 59

## 2020-09-21 ENCOUNTER — Other Ambulatory Visit: Payer: 59

## 2020-09-21 ENCOUNTER — Other Ambulatory Visit: Payer: Self-pay

## 2020-09-21 DIAGNOSIS — I2510 Atherosclerotic heart disease of native coronary artery without angina pectoris: Secondary | ICD-10-CM

## 2020-09-21 DIAGNOSIS — I5022 Chronic systolic (congestive) heart failure: Secondary | ICD-10-CM | POA: Diagnosis not present

## 2020-09-21 DIAGNOSIS — I255 Ischemic cardiomyopathy: Secondary | ICD-10-CM | POA: Diagnosis not present

## 2020-09-21 LAB — ECHOCARDIOGRAM COMPLETE
Area-P 1/2: 7.37 cm2
Calc EF: 29 %
MV M vel: 4.47 m/s
MV Peak grad: 79.9 mmHg
S' Lateral: 5.28 cm
Single Plane A2C EF: 31.6 %
Single Plane A4C EF: 28.1 %

## 2020-09-21 NOTE — Telephone Encounter (Signed)
Asked to evaluate patient by echo tech today for increasing SOB over the last few weeks. Echo today briefly reviewed shows LVEF stable around 20%, mild to mod RV dysfunction, mod MR and TR. Patient reports some abdominal distension, no LE edema. 2 weeks SOB/DOE, no chest pains. He will increase lasix to 80mg  in AM and 40mg  in PM x 3 days then udpate on symptoms, will contact his primary cardiologist Dr to make aware. Arrange f/u here and also CHF clinic.  [place] MD

## 2020-09-21 NOTE — Telephone Encounter (Signed)
Spoke with wife and pt and voiced understanding - will update [place] Monday on symptoms and aware that we will contact them regarding f/u appt and CHF appt    For this patient can we let him know I heard back from Dr Diona Browner, increase lasix to 80mg  in AM and 40mg  in pm x 3 days then resume 80mg  daily. Update on symptosm early next week. Needs f/u with Dr or in 2-3 weeks and then also a f/u with CHF clinic

## 2020-09-22 ENCOUNTER — Inpatient Hospital Stay (HOSPITAL_COMMUNITY)
Admission: EM | Admit: 2020-09-22 | Discharge: 2020-09-25 | DRG: 291 | Disposition: A | Discharge status: Home / Self Care | Payer: 59 | Attending: Internal Medicine | Admitting: Internal Medicine

## 2020-09-22 ENCOUNTER — Emergency Department (HOSPITAL_COMMUNITY): Payer: 59

## 2020-09-22 ENCOUNTER — Encounter (HOSPITAL_COMMUNITY): Payer: Self-pay | Admitting: Family Medicine

## 2020-09-22 ENCOUNTER — Other Ambulatory Visit: Payer: Self-pay

## 2020-09-22 DIAGNOSIS — R0789 Other chest pain: Secondary | ICD-10-CM | POA: Diagnosis not present

## 2020-09-22 DIAGNOSIS — I249 Acute ischemic heart disease, unspecified: Secondary | ICD-10-CM

## 2020-09-22 DIAGNOSIS — R0602 Shortness of breath: Secondary | ICD-10-CM | POA: Diagnosis not present

## 2020-09-22 DIAGNOSIS — I25118 Atherosclerotic heart disease of native coronary artery with other forms of angina pectoris: Secondary | ICD-10-CM

## 2020-09-22 DIAGNOSIS — I10 Essential (primary) hypertension: Secondary | ICD-10-CM

## 2020-09-22 DIAGNOSIS — R079 Chest pain, unspecified: Secondary | ICD-10-CM

## 2020-09-22 DIAGNOSIS — Z7984 Long term (current) use of oral hypoglycemic drugs: Secondary | ICD-10-CM

## 2020-09-22 DIAGNOSIS — I5023 Acute on chronic systolic (congestive) heart failure: Secondary | ICD-10-CM | POA: Diagnosis not present

## 2020-09-22 DIAGNOSIS — Z9114 Patient's other noncompliance with medication regimen: Secondary | ICD-10-CM

## 2020-09-22 DIAGNOSIS — I248 Other forms of acute ischemic heart disease: Secondary | ICD-10-CM

## 2020-09-22 DIAGNOSIS — Z7902 Long term (current) use of antithrombotics/antiplatelets: Secondary | ICD-10-CM

## 2020-09-22 DIAGNOSIS — Z20822 Contact with and (suspected) exposure to covid-19: Secondary | ICD-10-CM | POA: Diagnosis present

## 2020-09-22 DIAGNOSIS — J441 Chronic obstructive pulmonary disease with (acute) exacerbation: Secondary | ICD-10-CM

## 2020-09-22 DIAGNOSIS — Z833 Family history of diabetes mellitus: Secondary | ICD-10-CM

## 2020-09-22 DIAGNOSIS — G4733 Obstructive sleep apnea (adult) (pediatric): Secondary | ICD-10-CM | POA: Diagnosis present

## 2020-09-22 DIAGNOSIS — E119 Type 2 diabetes mellitus without complications: Secondary | ICD-10-CM

## 2020-09-22 DIAGNOSIS — I509 Heart failure, unspecified: Secondary | ICD-10-CM

## 2020-09-22 DIAGNOSIS — J449 Chronic obstructive pulmonary disease, unspecified: Secondary | ICD-10-CM

## 2020-09-22 DIAGNOSIS — E871 Hypo-osmolality and hyponatremia: Secondary | ICD-10-CM

## 2020-09-22 DIAGNOSIS — I429 Cardiomyopathy, unspecified: Secondary | ICD-10-CM | POA: Diagnosis present

## 2020-09-22 DIAGNOSIS — I110 Hypertensive heart disease with heart failure: Secondary | ICD-10-CM | POA: Diagnosis not present

## 2020-09-22 DIAGNOSIS — Z87891 Personal history of nicotine dependence: Secondary | ICD-10-CM

## 2020-09-22 DIAGNOSIS — Z825 Family history of asthma and other chronic lower respiratory diseases: Secondary | ICD-10-CM

## 2020-09-22 DIAGNOSIS — E1165 Type 2 diabetes mellitus with hyperglycemia: Secondary | ICD-10-CM | POA: Diagnosis not present

## 2020-09-22 DIAGNOSIS — Z8261 Family history of arthritis: Secondary | ICD-10-CM

## 2020-09-22 DIAGNOSIS — K219 Gastro-esophageal reflux disease without esophagitis: Secondary | ICD-10-CM

## 2020-09-22 DIAGNOSIS — I480 Paroxysmal atrial fibrillation: Secondary | ICD-10-CM

## 2020-09-22 DIAGNOSIS — I2510 Atherosclerotic heart disease of native coronary artery without angina pectoris: Secondary | ICD-10-CM | POA: Diagnosis present

## 2020-09-22 DIAGNOSIS — E785 Hyperlipidemia, unspecified: Secondary | ICD-10-CM

## 2020-09-22 DIAGNOSIS — I200 Unstable angina: Secondary | ICD-10-CM | POA: Diagnosis present

## 2020-09-22 DIAGNOSIS — M1990 Unspecified osteoarthritis, unspecified site: Secondary | ICD-10-CM | POA: Diagnosis present

## 2020-09-22 DIAGNOSIS — Z8249 Family history of ischemic heart disease and other diseases of the circulatory system: Secondary | ICD-10-CM

## 2020-09-22 DIAGNOSIS — Z951 Presence of aortocoronary bypass graft: Secondary | ICD-10-CM

## 2020-09-22 DIAGNOSIS — Z79899 Other long term (current) drug therapy: Secondary | ICD-10-CM

## 2020-09-22 HISTORY — DX: Atherosclerotic heart disease of native coronary artery without angina pectoris: I25.10

## 2020-09-22 HISTORY — DX: Heart failure, unspecified: I50.9

## 2020-09-22 LAB — CBC WITH DIFFERENTIAL/PLATELET
Abs Immature Granulocytes: 0.02 10*3/uL (ref 0.00–0.07)
Basophils Absolute: 0.1 10*3/uL (ref 0.0–0.1)
Basophils Relative: 1 %
Eosinophils Absolute: 0.3 10*3/uL (ref 0.0–0.5)
Eosinophils Relative: 4 %
HCT: 36.4 % — ABNORMAL LOW (ref 39.0–52.0)
Hemoglobin: 12.3 g/dL — ABNORMAL LOW (ref 13.0–17.0)
Immature Granulocytes: 0 %
Lymphocytes Relative: 23 %
Lymphs Abs: 1.9 10*3/uL (ref 0.7–4.0)
MCH: 28.9 pg (ref 26.0–34.0)
MCHC: 33.8 g/dL (ref 30.0–36.0)
MCV: 85.6 fL (ref 80.0–100.0)
Monocytes Absolute: 0.9 10*3/uL (ref 0.1–1.0)
Monocytes Relative: 11 %
Neutro Abs: 5.1 10*3/uL (ref 1.7–7.7)
Neutrophils Relative %: 61 %
Platelets: 226 10*3/uL (ref 150–400)
RBC: 4.25 MIL/uL (ref 4.22–5.81)
RDW: 15.4 % (ref 11.5–15.5)
WBC: 8.2 10*3/uL (ref 4.0–10.5)
nRBC: 0 % (ref 0.0–0.2)

## 2020-09-22 LAB — TROPONIN I (HIGH SENSITIVITY)
Troponin I (High Sensitivity): 35 ng/L — ABNORMAL HIGH (ref <18)
Troponin I (High Sensitivity): 33 ng/L — ABNORMAL HIGH (ref <18)
Troponin I (High Sensitivity): 33 ng/L — ABNORMAL HIGH (ref <18)
Troponin I (High Sensitivity): 28 ng/L — ABNORMAL HIGH (ref <18)

## 2020-09-22 LAB — HIV ANTIBODY (ROUTINE TESTING W REFLEX): HIV Screen 4th Generation wRfx: NONREACTIVE

## 2020-09-22 LAB — BASIC METABOLIC PANEL
Anion gap: 10 (ref 5–15)
BUN: 22 mg/dL — ABNORMAL HIGH (ref 6–20)
CO2: 24 mmol/L (ref 22–32)
Calcium: 8.7 mg/dL — ABNORMAL LOW (ref 8.9–10.3)
Chloride: 100 mmol/L (ref 98–111)
Creatinine, Ser: 0.73 mg/dL (ref 0.61–1.24)
GFR, Estimated: >60 mL/min (ref >60)
Glucose, Bld: 296 mg/dL — ABNORMAL HIGH (ref 70–99)
Potassium: 3.5 mmol/L (ref 3.5–5.1)
Sodium: 134 mmol/L — ABNORMAL LOW (ref 135–145)

## 2020-09-22 LAB — MAGNESIUM: Magnesium: 2.1 mg/dL (ref 1.7–2.4)

## 2020-09-22 LAB — RESPIRATORY PANEL BY RT PCR (FLU A&B, COVID)
Influenza A by PCR: NEGATIVE
Influenza B by PCR: NEGATIVE
SARS Coronavirus 2 by RT PCR: NEGATIVE

## 2020-09-22 LAB — HEPARIN LEVEL (UNFRACTIONATED)
Heparin Unfractionated: <0.1 IU/mL — ABNORMAL LOW (ref 0.30–0.70)
Heparin Unfractionated: <0.1 IU/mL — ABNORMAL LOW (ref 0.30–0.70)

## 2020-09-22 LAB — BRAIN NATRIURETIC PEPTIDE: B Natriuretic Peptide: 939 pg/mL — ABNORMAL HIGH (ref 0.0–100.0)

## 2020-09-22 LAB — GLUCOSE, CAPILLARY: Glucose-Capillary: 256 mg/dL — ABNORMAL HIGH (ref 70–99)

## 2020-09-22 LAB — CBG MONITORING, ED
Glucose-Capillary: 346 mg/dL — ABNORMAL HIGH (ref 70–99)
Glucose-Capillary: 302 mg/dL — ABNORMAL HIGH (ref 70–99)

## 2020-09-22 LAB — SEDIMENTATION RATE: Sed Rate: 20 mm/hr — ABNORMAL HIGH (ref 0–16)

## 2020-09-22 LAB — C-REACTIVE PROTEIN: CRP: 1.2 mg/dL — ABNORMAL HIGH (ref <1.0)

## 2020-09-22 MED ORDER — DIGOXIN 125 MCG PO TABS
0.1250 mg | ORAL_TABLET | Freq: Every day | ORAL | Status: DC
  Administered 2020-09-22 – 2020-09-25 (×4): 0.125 mg via ORAL
  Filled 2020-09-22 (×7): qty 1

## 2020-09-22 MED ORDER — METHYLPREDNISOLONE SODIUM SUCC 125 MG IJ SOLR
60.0000 mg | Freq: Two times a day (BID) | INTRAMUSCULAR | Status: DC
  Administered 2020-09-22 – 2020-09-23 (×3): 60 mg via INTRAVENOUS
  Filled 2020-09-22 (×3): qty 2

## 2020-09-22 MED ORDER — ROSUVASTATIN CALCIUM 20 MG PO TABS
40.0000 mg | ORAL_TABLET | Freq: Every day | ORAL | Status: DC
  Administered 2020-09-22 – 2020-09-25 (×4): 40 mg via ORAL
  Filled 2020-09-22: qty 2
  Filled 2020-09-22: qty 1
  Filled 2020-09-22: qty 2
  Filled 2020-09-22: qty 1
  Filled 2020-09-22: qty 2
  Filled 2020-09-22 (×2): qty 1

## 2020-09-22 MED ORDER — HEPARIN BOLUS VIA INFUSION
2300.0000 [IU] | Freq: Once | INTRAVENOUS | Status: AC
  Administered 2020-09-22: 2300 [IU] via INTRAVENOUS

## 2020-09-22 MED ORDER — MORPHINE SULFATE (PF) 4 MG/ML IV SOLN
4.0000 mg | Freq: Once | INTRAVENOUS | Status: AC
  Administered 2020-09-22: 4 mg via INTRAVENOUS
  Filled 2020-09-22: qty 1

## 2020-09-22 MED ORDER — HEPARIN SODIUM (PORCINE) 5000 UNIT/ML IJ SOLN
5000.0000 [IU] | Freq: Three times a day (TID) | INTRAMUSCULAR | Status: DC
  Administered 2020-09-22 – 2020-09-25 (×9): 5000 [IU] via SUBCUTANEOUS
  Filled 2020-09-22 (×9): qty 1

## 2020-09-22 MED ORDER — HEPARIN (PORCINE) 25000 UT/250ML-% IV SOLN
1300.0000 [IU]/h | INTRAVENOUS | Status: DC
  Administered 2020-09-22: 1000 [IU]/h via INTRAVENOUS
  Filled 2020-09-22 (×2): qty 250

## 2020-09-22 MED ORDER — AMIODARONE HCL 200 MG PO TABS
200.0000 mg | ORAL_TABLET | Freq: Every day | ORAL | Status: DC
  Administered 2020-09-22: 200 mg via ORAL
  Filled 2020-09-22: qty 1

## 2020-09-22 MED ORDER — POTASSIUM CHLORIDE CRYS ER 20 MEQ PO TBCR
20.0000 meq | EXTENDED_RELEASE_TABLET | Freq: Every day | ORAL | Status: DC
  Administered 2020-09-22 – 2020-09-25 (×4): 20 meq via ORAL
  Filled 2020-09-22 (×4): qty 1

## 2020-09-22 MED ORDER — ACETAMINOPHEN 325 MG PO TABS: 650.0000 mg | ORAL_TABLET | ORAL | Status: DC | PRN

## 2020-09-22 MED ORDER — MAGNESIUM OXIDE 400 (241.3 MG) MG PO TABS
400.0000 mg | ORAL_TABLET | Freq: Two times a day (BID) | ORAL | Status: DC
  Administered 2020-09-22 – 2020-09-25 (×7): 400 mg via ORAL
  Filled 2020-09-22 (×7): qty 1

## 2020-09-22 MED ORDER — ONDANSETRON HCL 4 MG/2ML IJ SOLN: 4.0000 mg | Freq: Four times a day (QID) | INTRAMUSCULAR | Status: DC | PRN

## 2020-09-22 MED ORDER — FUROSEMIDE 40 MG PO TABS: 80.0000 mg | ORAL_TABLET | Freq: Every day | ORAL | Status: DC

## 2020-09-22 MED ORDER — ASPIRIN EC 81 MG PO TBEC
81.0000 mg | DELAYED_RELEASE_TABLET | Freq: Every day | ORAL | Status: DC
  Administered 2020-09-22 – 2020-09-25 (×4): 81 mg via ORAL
  Filled 2020-09-22 (×4): qty 1

## 2020-09-22 MED ORDER — HEPARIN BOLUS VIA INFUSION
4000.0000 [IU] | Freq: Once | INTRAVENOUS | Status: AC
  Administered 2020-09-22: 4000 [IU] via INTRAVENOUS

## 2020-09-22 MED ORDER — MORPHINE SULFATE (PF) 4 MG/ML IV SOLN: 4.0000 mg | Freq: Once | INTRAVENOUS | Status: DC

## 2020-09-22 MED ORDER — FUROSEMIDE 10 MG/ML IJ SOLN
80.0000 mg | Freq: Once | INTRAMUSCULAR | Status: AC
  Administered 2020-09-22: 80 mg via INTRAVENOUS
  Filled 2020-09-22: qty 8

## 2020-09-22 MED ORDER — IPRATROPIUM-ALBUTEROL 0.5-2.5 (3) MG/3ML IN SOLN
3.0000 mL | Freq: Four times a day (QID) | RESPIRATORY_TRACT | Status: DC
  Administered 2020-09-22 – 2020-09-25 (×14): 3 mL via RESPIRATORY_TRACT
  Filled 2020-09-22 (×15): qty 3

## 2020-09-22 MED ORDER — LIDOCAINE VISCOUS HCL 2 % MT SOLN
15.0000 mL | Freq: Once | OROMUCOSAL | Status: AC
  Administered 2020-09-22: 15 mL via ORAL
  Filled 2020-09-22: qty 15

## 2020-09-22 MED ORDER — FUROSEMIDE 10 MG/ML IJ SOLN
40.0000 mg | Freq: Two times a day (BID) | INTRAMUSCULAR | Status: DC
  Administered 2020-09-22 – 2020-09-25 (×6): 40 mg via INTRAVENOUS
  Filled 2020-09-22 (×6): qty 4

## 2020-09-22 MED ORDER — ADULT MULTIVITAMIN W/MINERALS CH
1.0000 | ORAL_TABLET | Freq: Every day | ORAL | Status: DC
  Administered 2020-09-22 – 2020-09-25 (×4): 1 via ORAL
  Filled 2020-09-22 (×4): qty 1

## 2020-09-22 MED ORDER — SACUBITRIL-VALSARTAN 24-26 MG PO TABS
1.0000 | ORAL_TABLET | Freq: Two times a day (BID) | ORAL | Status: DC
  Administered 2020-09-22 – 2020-09-25 (×7): 1 via ORAL
  Filled 2020-09-22 (×13): qty 1

## 2020-09-22 MED ORDER — CLOPIDOGREL BISULFATE 75 MG PO TABS
75.0000 mg | ORAL_TABLET | Freq: Every day | ORAL | Status: DC
  Administered 2020-09-22 – 2020-09-25 (×4): 75 mg via ORAL
  Filled 2020-09-22 (×4): qty 1

## 2020-09-22 MED ORDER — CARVEDILOL 3.125 MG PO TABS
6.2500 mg | ORAL_TABLET | Freq: Two times a day (BID) | ORAL | Status: DC
  Administered 2020-09-22 – 2020-09-25 (×7): 6.25 mg via ORAL
  Filled 2020-09-22 (×7): qty 2

## 2020-09-22 MED ORDER — INSULIN DETEMIR 100 UNIT/ML ~~LOC~~ SOLN
12.0000 [IU] | Freq: Two times a day (BID) | SUBCUTANEOUS | Status: DC
  Administered 2020-09-22 – 2020-09-24 (×5): 12 [IU] via SUBCUTANEOUS
  Filled 2020-09-22 (×7): qty 0.12

## 2020-09-22 MED ORDER — ALUM & MAG HYDROXIDE-SIMETH 200-200-20 MG/5ML PO SUSP
30.0000 mL | Freq: Once | ORAL | Status: AC
  Administered 2020-09-22: 30 mL via ORAL
  Filled 2020-09-22: qty 30

## 2020-09-22 MED ORDER — IPRATROPIUM-ALBUTEROL 20-100 MCG/ACT IN AERS: 1.0000 | INHALATION_SPRAY | Freq: Four times a day (QID) | RESPIRATORY_TRACT | Status: DC | PRN

## 2020-09-22 MED ORDER — INSULIN DETEMIR 100 UNIT/ML ~~LOC~~ SOLN
12.0000 [IU] | Freq: Every day | SUBCUTANEOUS | Status: DC
  Filled 2020-09-22 (×3): qty 0.12

## 2020-09-22 MED ORDER — BUDESONIDE 0.5 MG/2ML IN SUSP
0.5000 mg | Freq: Two times a day (BID) | RESPIRATORY_TRACT | Status: DC
  Administered 2020-09-22 – 2020-09-25 (×7): 0.5 mg via RESPIRATORY_TRACT
  Filled 2020-09-22 (×7): qty 2

## 2020-09-22 MED ORDER — ALPRAZOLAM 0.25 MG PO TABS: 0.2500 mg | ORAL_TABLET | Freq: Two times a day (BID) | ORAL | Status: DC | PRN

## 2020-09-22 MED ORDER — INSULIN ASPART 100 UNIT/ML ~~LOC~~ SOLN
0.0000 [IU] | Freq: Three times a day (TID) | SUBCUTANEOUS | Status: DC
  Administered 2020-09-22 – 2020-09-23 (×4): 11 [IU] via SUBCUTANEOUS
  Administered 2020-09-23: 5 [IU] via SUBCUTANEOUS
  Administered 2020-09-24: 2 [IU] via SUBCUTANEOUS
  Administered 2020-09-24: 11 [IU] via SUBCUTANEOUS
  Administered 2020-09-25 (×2): 3 [IU] via SUBCUTANEOUS
  Filled 2020-09-22 (×2): qty 1

## 2020-09-22 MED ORDER — INSULIN ASPART 100 UNIT/ML ~~LOC~~ SOLN
0.0000 [IU] | Freq: Every day | SUBCUTANEOUS | Status: DC
  Administered 2020-09-22: 3 [IU] via SUBCUTANEOUS
  Administered 2020-09-23: 4 [IU] via SUBCUTANEOUS
  Administered 2020-09-24: 2 [IU] via SUBCUTANEOUS

## 2020-09-22 MED ORDER — TRAZODONE HCL 50 MG PO TABS
50.0000 mg | ORAL_TABLET | Freq: Every evening | ORAL | Status: DC | PRN
  Administered 2020-09-24: 50 mg via ORAL
  Filled 2020-09-22: qty 1

## 2020-09-22 MED ORDER — PANTOPRAZOLE SODIUM 40 MG PO TBEC
40.0000 mg | DELAYED_RELEASE_TABLET | Freq: Every day | ORAL | Status: DC
  Administered 2020-09-22 – 2020-09-25 (×4): 40 mg via ORAL
  Filled 2020-09-22 (×4): qty 1

## 2020-09-22 MED ORDER — NITROGLYCERIN 2 % TD OINT
1.0000 [in_us] | TOPICAL_OINTMENT | Freq: Once | TRANSDERMAL | Status: AC
  Administered 2020-09-22: 1 [in_us] via TOPICAL
  Filled 2020-09-22: qty 1

## 2020-09-22 MED ORDER — MOMETASONE FURO-FORMOTEROL FUM 200-5 MCG/ACT IN AERO: 2.0000 | INHALATION_SPRAY | Freq: Two times a day (BID) | RESPIRATORY_TRACT | Status: DC

## 2020-09-22 NOTE — ED Provider Notes (Signed)
[facility] EMERGENCY DEPARTMENT Provider Note   CSN: [id number] Arrival date & time: 09/22/20  4580   History No chief complaint on file.   Kevin Underwood is a 59 y.o. male.  The history is provided by the patient.  He has history of diabetes, hypertension, COPD, paroxysmal atrial fibrillation, coronary artery disease status post prior artery bypass, combined systolic and diastolic heart failure and comes in complaining of a pressure feeling in his chest since 6 PM.  This discomfort came on at rest and was severe and he rated it at 9/10.  Nothing made it better, nothing made it worse.  There was associated dyspnea but no nausea or diaphoresis.  He has been having episodes like this over the last several weeks and has had nocturnal symptoms.  He did take aspirin at home and EMS gave him nitroglycerin which gave him partial relief and discomfort is now down to 5/10.  He is status post coronary artery bypass about 10 months ago.  Symptoms are similar to what he had prior to his bypass.  He has noted that he has been more short of breath and more weak than normal over the last 2 weeks.  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   Cardiogenic shock (HCC)    Acute respiratory failure (HCC)    RUQ abdominal pain    Cardiomyopathy (HCC) 11/21/2019   Acute systolic CHF (congestive heart failure) (HCC) 11/21/2019   History of alcohol abuse Quit 6 mos ago 11/21/2019   History of recreational drug use 11/21/2019   PAF (paroxysmal atrial fibrillation) (HCC) 11/21/2019   Cardiac volume overload 11/21/2019   Elevated brain natriuretic peptide (BNP) level 11/21/2019   Leg edema, right 11/21/2019   Hyponatremia 11/21/2019   Abnormal  electrocardiogram (ECG) (EKG) 11/21/2019   Hyperglycemia 11/21/2019   Tremulousness 11/21/2019   Restlessness and agitation 11/21/2019   OSA (obstructive sleep apnea) 11/21/2019   Atrial fibrillation with RVR (HCC) 11/20/2019   Acute congestive heart failure (HCC) 11/20/2019   Acute respiratory distress 11/20/2019   COPD (chronic obstructive pulmonary disease) (HCC)    Hypertension    Type 2 diabetes mellitus (HCC)    Closed fracture of 5th metacarpal 08/31/2012    Past Surgical History:  Procedure Laterality Date   BACK SURGERY     CLIPPING OF ATRIAL APPENDAGE N/A 11/26/2019   Procedure: Clipping Of Atrial Appendage using AtriCure 40 Clip;  Surgeon: [name], MD;  Location: [facility] OR;  Service: Open Heart Surgery;  Laterality: N/A;   CORONARY ARTERY BYPASS GRAFT N/A 11/26/2019   Procedure: CORONARY ARTERY BYPASS GRAFTING (CABG) using endoscopic greater saphenous vein harvest: svc to OM; svc to Diag; svc to PD; and LIMA to LAD.;  Surgeon: [name], MD;  Location: [facility] OR;  Service: Open Heart Surgery;  Laterality: N/A;   FOOT SURGERY     HAND SURGERY     INTRAOPERATIVE TRANSESOPHAGEAL ECHOCARDIOGRAM  12/04/2019   Procedure: Intraoperative Transesophageal Echocardiogram;  Surgeon: [name], MD;  Location: [facility] OR;  Service: Open Heart Surgery;;   MAZE N/A 11/26/2019   Procedure: MAZE using Bilateral Pulmonary Vein isolation.;  Surgeon: [name], MD;  Location: [facility] OR;  Service: Open Heart Surgery;  Laterality: N/A;   PLACEMENT OF IMPELLA LEFT VENTRICULAR ASSIST DEVICE N/A 11/24/2019   Procedure: PLACEMENT OF IMPELLA 5.5 LEFT VENTRICULAR ASSIST DEVICE;  Surgeon: [name], MD;  Location: [facility] OR;  Service: Open Heart Surgery;  Laterality: N/A;   REMOVAL OF IMPELLA LEFT VENTRICULAR ASSIST DEVICE Right 12/04/2019   Procedure: REMOVAL OF IMPELLA LEFT VENTRICULAR ASSIST DEVICE, right axilla;  Surgeon: [name], MD;  Location: [facility]  OR;  Service: Open Heart Surgery;  Laterality: Right;   RIGHT/LEFT HEART CATH AND CORONARY ANGIOGRAPHY N/A 11/24/2019   Procedure: RIGHT/LEFT HEART CATH AND CORONARY ANGIOGRAPHY;  Surgeon: Laurey Morale, MD;  Location: [facility] INVASIVE CV LAB;  Service: Cardiovascular;  Laterality: N/A;   TEE WITHOUT CARDIOVERSION N/A 11/24/2019   Procedure: TRANSESOPHAGEAL ECHOCARDIOGRAM (TEE);  Surgeon: [name], MD;  [facility] OR;  Service: Open Heart Surgery;  Laterality: N/A;   TEE WITHOUT CARDIOVERSION N/A 11/26/2019   Procedure: TRANSESOPHAGEAL ECHOCARDIOGRAM (TEE);  Surgeon: [name], MD;  Location: [facility] OR;  Service: Open Heart Surgery;  Laterality: N/A;       Family History  Problem Relation Age of Onset   Arthritis Other    Lung disease Other    Asthma Other    Diabetes Other    Heart disease Sister        Tumor?    Social History   Tobacco Use   Smoking status: Former Smoker    Packs/day: 1.00    Years: 30.00    Pack years: 30.00    Types: Cigarettes    Quit date: 12/20/2019    Years since quitting: 0.7   Smokeless tobacco: Never Used  Vaping Use   Vaping Use: Never used  Substance Use Topics   Alcohol use: Yes    Comment: Prior history of excessive intake   Drug use: No    Home Medications Prior to Admission medications   Medication Sig Start Date End Date Taking? Authorizing Provider  amiodarone (PACERONE) 200 MG tablet Take 1 tablet (200 mg total) by mouth daily. Needs appt for further refills 06/26/20   Tonye Becket D, NP  aspirin 81 MG tablet Take 81 mg by mouth daily.    [provider]  carvedilol (COREG) 6.25 MG tablet Take 1 tablet (6.25 mg total) by mouth 2 (two) times daily with a meal. 05/24/20   [name]., NP  clopidogrel (PLAVIX) 75 MG tablet Take 1 tablet (75 mg total) by mouth daily. 05/24/20   [name]., NP  colchicine 0.6 MG tablet Take 1 tablet (0.6 mg total) by mouth daily. 12/09/19   Gold, Wayne E,  PA-C  COMBIVENT RESPIMAT 20-100 MCG/ACT AERS respimat Inhale 1 puff into the lungs every 6 (six) hours as needed. 12/08/19   Rowe Clack, PA-C  digoxin (LANOXIN) 0.125 MG tablet Take 1 tablet (0.125 mg total) by mouth daily. 05/24/20   [name]., NP  furosemide (LASIX) 80 MG tablet Take 1 tablet (80 mg total) by mouth daily. 12/13/19 12/12/20  [name], MD  glimepiride (AMARYL) 2 MG tablet Take 2 mg by mouth daily. Reported on 11/28/2015    [provider]  INVOKAMET XR [phone] MG TB24 Take 2 tablets by mouth daily. 01/17/20   [provider]  magnesium oxide (MAG-OX) 400 (241.3 Mg) MG tablet Take 1 tablet (400 mg total) by mouth 2 (two) times daily. 12/08/19   Rowe Clack, PA-C  metFORMIN (GLUCOPHAGE) 500 MG tablet Take 1,000 mg by mouth 2 (two) times daily with a meal.    [provider]  Multiple Vitamin (MULTIVITAMIN WITH MINERALS) TABS tablet Take 1 tablet by mouth daily. 12/09/19   Gold, Deniece Portela E, PA-C  potassium chloride SA (KLOR-CON) 20 MEQ tablet Take 1 tablet (20 mEq total) by mouth daily. 12/08/19   Gold, Deniece Portela E, PA-C  rosuvastatin (CRESTOR) 40 MG tablet TAKE 1 TABLET(40 MG) BY MOUTH DAILY AT 6 PM 05/02/20   Donata Clay, Theron Arista, MD  sacubitril-valsartan (ENTRESTO) 24-26 MG Take 1 tablet by mouth 2 (two) times daily. 05/24/20   [name]., NP  spironolactone (ALDACTONE) 25 MG tablet Take 1 tablet (25 mg total) by mouth daily. 12/09/19   Gold, Glenice Laine, PA-C  SYMBICORT 160-4.5 MCG/ACT inhaler Inhale 2 puffs into the lungs 2 (two) times daily.  03/30/19   [provider]  traZODone (DESYREL) 50 MG tablet Take 50 mg by mouth at bedtime.    [provider]  VICTOZA 18 MG/3ML SOPN Inject 1.2 Units into the skin daily. 09/05/15   [provider]    Allergies    Patient has no known allergies.  Review of Systems   Review of Systems  All other systems reviewed and are negative.   Physical Exam Updated Vital Signs BP  109/85    Pulse 97    Resp (!) 21    Ht  (1.778 m)    Wt 79.4 kg    SpO2 100%    BMI 25.11 kg/m   Physical Exam Vitals and nursing note reviewed.   59 year old male, resting comfortably and in no acute distress. Vital signs are significant for elevated respiratory rate. Oxygen saturation is 98%, which is normal. Head is normocephalic and atraumatic. PERRLA, EOMI. Oropharynx is clear. Neck is nontender and supple without adenopathy or JVD. Back is nontender and there is no CVA tenderness. Lungs are clear without rales, wheezes, or rhonchi. Chest is nontender.  Sternotomy scar present and well-healed. Heart has regular rate and rhythm without murmur. Abdomen is soft, flat, nontender without masses or hepatosplenomegaly and peristalsis is normoactive. Extremities have no cyanosis or edema, full range of motion is present. Skin is warm and dry without rash. Neurologic: Mental status is normal, cranial nerves are intact, there are no motor or sensory deficits.  ED Results / Procedures / Treatments   Labs (all labs ordered are listed, but only abnormal results are displayed) Labs Reviewed - No data to display  EKG EKG Interpretation  Date/Time:  Friday September 22 2020 03:08:34 EDT Ventricular Rate:  99 PR Interval:    QRS Duration: 89 QT Interval:  342 QTC Calculation: 439 R Axis:   94 Text Interpretation: Multifocal atrial tachycardia Probable left atrial enlargement Borderline right axis deviation Anteroseptal infarct, old Nonspecific T abnormalities, lateral leads When compared with ECG of 02/02/2020, T wave abnormality is slightly more pronounced Confirmed by Dione Booze ([id number]) on 09/22/2020 3:13:43 AM   Radiology ECHOCARDIOGRAM COMPLETE  Result Date: 09/21/2020    ECHOCARDIOGRAM REPORT   Patient Name:   EUCLIDE GRANITO Date of Exam: 09/21/2020 Medical Rec #:  [id number]        Height:       70.0 in Accession #:    [id number]       Weight:       173.2 lb Date of Birth:   1961/03/04        BSA:  1.964 m Patient Age:    59 years         BP:           120/68 mmHg Patient Gender: M                HR:           101 bpm. Exam Location:  [place] Procedure: 2D Echo, Cardiac Doppler, Color Doppler and Strain Analysis Indications:    I25.110 Atherosclerotic heart disease of native coronary artery                 with unstable angina pectoris; I25.5 Ischemic cardiomyopathy;                 I50.22 Chronic systolic (congestive) heart failure; R06.02 SOB;                 R06.9 DOE  History:        Patient has prior history of Echocardiogram examinations, most                 recent 12/05/2019. CHF and Cardiomyopathy, CAD, Prior CABG, OSA                 and COPD, Arrythmias:Atrial Fibrillation,                 Signs/Symptoms:Shortness of Breath and Dyspnea; Risk                 Factors:Hypertension, Diabetes and Former Smoker. C/o episode of                 chest pain about a month ago -went to ER. Left AMA because                 "there were no beds.".  Sonographer:    Vella Kohler BS, RVT, RDCS Referring Phys: [phone] [name].  Sonographer Comments: Global longitudinal strain was attempted. IMPRESSIONS  1. Left ventricular ejection fraction, by estimation, is 20 to 25%. The left ventricle has severely decreased function. The left ventricle demonstrates global hypokinesis. The left ventricular internal cavity size was mildly dilated. Left ventricular diastolic parameters are indeterminate.  2. Right ventricular systolic function is mildly reduced. The right ventricular size is mildly enlarged.  3. Left atrial size was moderately dilated.  4. Right atrial size was moderately dilated.  5. The pericardial effusion is circumferential.  6. The mitral valve is abnormal. Moderate mitral valve regurgitation. No evidence of mitral stenosis.  7. The tricuspid valve is abnormal. Tricuspid valve regurgitation is moderate.  8. The aortic valve is tricuspid. Aortic valve regurgitation is not  visualized. No aortic stenosis is present.  9. The inferior vena cava is normal in size with greater than 50% respiratory variability, suggesting right atrial pressure of 3 mmHg. Comparison(s): Prior Echo showed LV EF 20-25%, severely decreased function. Grade II diastolic dysfunction. Moderately reduced RV systolic function. PASP 31.5. FINDINGS  Left Ventricle: Left ventricular ejection fraction, by estimation, is 20 to 25%. The left ventricle has severely decreased function. The left ventricle demonstrates global hypokinesis. The left ventricular internal cavity size was mildly dilated. There is no left ventricular hypertrophy. Left ventricular diastolic parameters are indeterminate. Right Ventricle: The right ventricular size is mildly enlarged. Right vetricular wall thickness was not assessed. Right ventricular systolic function is mildly reduced. Left Atrium: Left atrial size was moderately dilated. Right Atrium: Right atrial size was moderately dilated. Pericardium: Trivial pericardial effusion is present. The pericardial effusion  is circumferential. Mitral Valve: The mitral valve is abnormal. Moderate mitral valve regurgitation. No evidence of mitral valve stenosis. Tricuspid Valve: The tricuspid valve is abnormal. Tricuspid valve regurgitation is moderate . No evidence of tricuspid stenosis. Aortic Valve: The aortic valve is tricuspid. Aortic valve regurgitation is not visualized. No aortic stenosis is present. Pulmonic Valve: The pulmonic valve was not well visualized. Pulmonic valve regurgitation is not visualized. No evidence of pulmonic stenosis. Aorta: The aortic root is normal in size and structure. Pulmonary Artery: Moderate pulmonary HTN, PASP is 50 mmHg. Venous: The inferior vena cava is normal in size with greater than 50% respiratory variability, suggesting right atrial pressure of 3 mmHg. IAS/Shunts: No atrial level shunt detected by color flow Doppler.  LEFT VENTRICLE PLAX 2D LVIDd:         5.97  cm      Diastology LVIDs:         5.28 cm      LV e' medial:    7.35 cm/s LV PW:         1.29 cm      LV E/e' medial:  14.7 LV IVS:        1.11 cm      LV e' lateral:   9.19 cm/s LVOT diam:     2.00 cm      LV E/e' lateral: 11.8 LV SV:         46 LV SV Index:   24 LVOT Area:     3.14 cm  LV Volumes (MOD) LV vol d, MOD A2C: 140.0 ml LV vol d, MOD A4C: 136.0 ml LV vol s, MOD A2C: 95.8 ml LV vol s, MOD A4C: 97.8 ml LV SV MOD A2C:     44.2 ml LV SV MOD A4C:     136.0 ml LV SV MOD BP:      40.5 ml RIGHT VENTRICLE RV S prime:     9.77 cm/s TAPSE (M-mode): 1.3 cm LEFT ATRIUM             Index LA diam:        4.40 cm 2.24 cm/m LA Vol (A2C):           36.10 ml/m LA Vol (A4C):           34.40 ml/m LA Biplane Vol:         36.70 ml/m  AORTIC VALVE             PULMONIC VALVE LVOT Vmax:   96.00 cm/s  PV Vmax:       0.70 m/s LVOT Vmean:  65.200 cm/s PV Peak grad:  1.9 mmHg LVOT VTI:    0.148 m  AORTA Ao Root diam: 3.80 cm Ao Asc diam:  3.50 cm MITRAL VALVE                 TRICUSPID VALVE MV Area (PHT): 7.37 cm      TR Peak grad:   46.0 mmHg MV Decel Time: 103 msec      TR Vmax:        339.00 cm/s MR Peak grad:    79.9 mmHg MR Mean grad:    49.5 mmHg   SHUNTS MR Vmax:         447.00 cm/s Systemic VTI:  0.15 m MR Vmean:        326.5 cm/s  Systemic Diam: 2.00 cm MR PISA Eff ROA: 16 mm MV E velocity: 108.00 cm/s MV A velocity: 32.50 cm/s  MV E/A ratio:  3.32 Dina Rich MD Electronically signed by Dina Rich MD Signature Date/Time: 09/21/2020/6:09:54 PM    Final     Procedures Procedures  CRITICAL CARE Performed by: Dione Booze Total critical care time: 85 minutes Critical care time was exclusive of separately billable procedures and treating other patients. Critical care was necessary to treat or prevent imminent or life-threatening deterioration. Critical care was time spent personally by me on the following activities: development of treatment plan with patient and/or surrogate as well as nursing,  discussions with consultants, evaluation of patient's response to treatment, examination of patient, obtaining history from patient or surrogate, ordering and performing treatments and interventions, ordering and review of laboratory studies, ordering and review of radiographic studies, pulse oximetry and re-evaluation of patient's condition.  Medications Ordered in ED Medications  heparin bolus via infusion 4,000 Units (has no administration in time range)  heparin ADULT infusion 100 units/mL (25000 units/242mL sodium chloride 0.45%) (has no administration in time range)  nitroGLYCERIN (NITROGLYN) 2 % ointment 1 inch (1 inch Topical Given 09/22/20 0343)  morphine 4 MG/ML injection 4 mg (4 mg Intravenous Given 09/22/20 0340)    ED Course  I have reviewed the triage vital signs and the nursing notes.  Pertinent labs & imaging results that were available during my care of the patient were reviewed by me and considered in my medical decision making (see chart for details).  MDM Rules/Calculators/A&P Chest discomfort very worrisome for ACS.  Overall progression of symptoms over the last several weeks consistent with unstable angina.  Old records are reviewed, and he had been admitted at [facility] 1 month ago but left AMA and apparently had not followed up with cardiology since then, until he had an echocardiogram done yesterday.  He will be started on heparin.  We given morphine and started on topical nitrates.  ECG shows some mild worsening of T wave changes, but no STEMI.  Troponin has come back mildly elevated, delta troponin pending.  He did have temporary relief of discomfort with morphine but symptoms recurred and he is given additional morphine.  Labs also show mild hyponatremia and not felt to be clinically significant.  Case was discussed with Dr. Okey Dupre of cardiology service who requested patient be admitted here with cardiology consultation.  Case is discussed with Dr. Carren Rang of  [facility], who agrees to admit the patient.  Final Clinical Impression(s) / ED Diagnoses Final diagnoses:  None    Rx / DC Orders ED Discharge Orders    None       Dione Booze, MD 09/22/20 [phone]

## 2020-09-22 NOTE — Progress Notes (Signed)
ANTICOAGULATION CONSULT NOTE -  Pharmacy Consult for Heparin Indication: chest pain/ACS  No Known Allergies  Patient Measurements: Height: 5\' 10"  (177.8 cm) Weight: 79.4 kg (175 lb) IBW/kg (Calculated) : 73   Vital Signs: BP: 118/82 (10/15 0630) Pulse Rate: 50 (10/15 0630)  Labs: Recent Labs    09/22/20 0348 09/22/20 0620 09/22/20 0944  HGB 12.3*  --   --   HCT 36.4*  --   --   PLT 226  --   --   HEPARINUNFRC  --   --  <0.10*  CREATININE 0.73  --   --   TROPONINIHS 35* 33*  --     Estimated Creatinine Clearance: 102.7 mL/min (by C-G formula based on SCr of 0.73 mg/dL).   Medical History: Past Medical History:  Diagnosis Date  . Arthritis   . CAD (coronary artery disease)    a. s/p CABG in 11/2019 with LIMA-LAD, SVG-OM1, SVG-PDA and SVG-D1  . CHF (congestive heart failure) (HCC)    a. EF < 20% by echo in 11/2019  . COPD (chronic obstructive pulmonary disease) (HCC)   . Essential hypertension   . Type 2 diabetes mellitus (HCC)     Medications:  Await electronic med rec  Assessment: 59 y.o. M presents with CP. To begin heparin for r/o ACS. No AC PTA.  Goal of Therapy:  Heparin level 0.3-0.7 units/ml Monitor platelets by anticoagulation protocol: Yes   Plan:  Heparin IV rebolus 2300 units Increase Heparin gtt to 1300 units/hr Will f/u heparin level in 6 hours Daily heparin level and CBC  46, PharmD Clinical Pharmacist 09/22/2020 11:11 AM

## 2020-09-22 NOTE — Progress Notes (Signed)
ANTICOAGULATION CONSULT NOTE - Initial Consult  Pharmacy Consult for Heparin Indication: chest pain/ACS  No Known Allergies  Patient Measurements: Height: 5\' 10"  (177.8 cm) Weight: 79.4 kg (175 lb) IBW/kg (Calculated) : 73   Vital Signs: BP: 109/74 (10/15 0330) Pulse Rate: 94 (10/15 0330)  Labs: No results for input(s): HGB, HCT, PLT, APTT, LABPROT, INR, HEPARINUNFRC, HEPRLOWMOCWT, CREATININE, CKTOTAL, CKMB, TROPONINIHS in the last 72 hours.  CrCl cannot be calculated (Patient's most recent lab result is older than the maximum 21 days allowed.).   Medical History: Past Medical History:  Diagnosis Date   Arthritis    COPD (chronic obstructive pulmonary disease) (HCC)    Essential hypertension    Type 2 diabetes mellitus (HCC)     Medications:  Await electronic med rec  Assessment: 59 y.o. M presents with CP. To begin heparin for r/o ACS. No AC PTA.  Goal of Therapy:  Heparin level 0.3-0.7 units/ml Monitor platelets by anticoagulation protocol: Yes   Plan:  Heparin IV bolus 4000 units Heparin gtt at 1000 units/hr Will f/u heparin level in 6 hours Daily heparin level and CBC  46, PharmD, BCPS Please see amion for complete clinical pharmacist phone list 09/22/2020,3:40 AM

## 2020-09-22 NOTE — ED Triage Notes (Signed)
Pt c/o chest pain starting around 5pm last night while sitting at home. Pt took ASA prior to EMS arrival. Given 2 nitro enroute, pain decreasing from 9 to 5. Pain worse with movement and coughing.

## 2020-09-22 NOTE — Consult Note (Addendum)
Cardiology Consult    Patient ID: [id number] REINIG; [id number]; [birth year]   Admit date: 09/22/2020 Date of Consult: 09/22/2020  Primary Care Provider: Monico Blitz, MD Primary Cardiologist: Rozann Lesches, MD   Patient Profile    Kevin Underwood is a 59 y.o. male with past medical history of CAD (s/p CABG in 11/2019 with LIMA-LAD, SVG-OM1, SVG-PDA and SVG-D1), chronic systolic CHF/ICM (EF < 81% by echo in 11/2019), paroxysmal atrial fibrillation (s/p LAA clipping in 11/2019), pulmonary HTN, HTN, HLD, Type 2 DM and COPD who is being seen today for Kevin evaluation of CHF at Kevin request of Dr. Clearence Ped.   History of Present Illness    Kevin Underwood was last examined by Katina Dung, NP in 05/2020 and by review of notes had been without several of his medications at that time including Coreg, Plavix, Digoxin and Entresto. He reported pain along his sternal region but denied any exertional symptoms. He was restarted on Plavix, Entresto, Coreg and Digoxin along with continuing on Lasix 80 mg daily,  Crestor 40 mg daily and Spironolactone 68m daily. He was scheduled for repeat echocardiogram which was performed on 09/21/2020 and showed that his EF remained reduced at 20 to 25% with global hypokinesis and mildly reduced RV function. He did have a trivial pericardial effusion and biatrial dilation. Also noted to have moderate MR and moderate TR. Dr. BHarl Bowieevaluated Kevin patient at Kevin time of his echocardiogram due to complaints of shortness of breath it was recommended he increase his Lasix to 840min AM/4053mn PM for 3 days and to arrange follow-up.   [facility] of chest discomfort which started yesterday evening. In talking with Kevin patient today, he reports progressive dyspnea on exertion and at rest for Kevin past two weeks. Says he has not been able to sleep for Kevin past 3 nights due to his breathing. Has not weighed recently so unsure of any acute  changes but denies any lower extremity edema. Has experienced more abdominal distension. Starting last night, he developed pressure along his left precordium and this has been constant since onset, now lasting for 10+ hours. Not associated with exertion. Worse with inspiration or coughing. Says he has been compliant with his medications but is unsure of what he is currently taking as his wife manages his medications.   Initial labs showed WBC 8.2, Hgb 12.3, platelets 226, Na+ 134, K+ 3.5 and creatinine 0.73. Initial HS Troponin 35 with repeat of 33. BNP not ordered. COVID negative. CXR shows cardiomegaly with mild interstitial edema consistent with CHF and minimal bibasilar atelectasis. EKG shows NSR, HR 99 with PAC's and TWI along Kevin lateral leads which is similar to prior tracings.   He has received morphine and NTG patch along with being started on Heparin.   Past Medical History:  Diagnosis Date  . Arthritis   . CAD (coronary artery disease)    a. s/p CABG in 11/2019 with LIMA-LAD, SVG-OM1, SVG-PDA and SVG-D1  . CHF (congestive heart failure) ([place]  a. EF < 20% by echo in 11/2019  . COPD (chronic obstructive pulmonary disease) [place] [place] . Essential hypertension   . Type 2 diabetes mellitus ([facility]   Past Surgical History:  Procedure Laterality Date  . BACK SURGERY    . CLIPPING OF ATRIAL APPENDAGE N/A 11/26/2019   Procedure: Clipping Of Atrial Appendage using AtriCure 40 Clip;  Surgeon: AtkWonda OldsD;  Location: [facility] OR;  Service: Open Heart Surgery;  Laterality: N/A;  . CORONARY ARTERY BYPASS GRAFT N/A 11/26/2019   Procedure: CORONARY ARTERY BYPASS GRAFTING (CABG) using endoscopic greater saphenous vein harvest: svc to OM; svc to Diag; svc to PD; and LIMA to LAD.;  Surgeon: Wonda Olds, MD;  Location: [place];  Service: Open Heart Surgery;  Laterality: N/A;  . FOOT SURGERY    . HAND SURGERY    . INTRAOPERATIVE TRANSESOPHAGEAL ECHOCARDIOGRAM  12/04/2019   Procedure:  Intraoperative Transesophageal Echocardiogram;  Surgeon: Wonda Olds, MD;  Location: [facility] OR;  Service: Open Heart Surgery;;  . MAZE N/A 11/26/2019   Procedure: MAZE using Bilateral Pulmonary Vein isolation.;  Surgeon: Wonda Olds, MD;  Location: [place];  Service: Open Heart Surgery;  Laterality: N/A;  . PLACEMENT OF IMPELLA LEFT VENTRICULAR ASSIST DEVICE N/A 11/24/2019   Procedure: PLACEMENT OF IMPELLA 5.5 LEFT VENTRICULAR ASSIST DEVICE;  Surgeon: Wonda Olds, MD;  Location: [place];  Service: Open Heart Surgery;  Laterality: N/A;  . REMOVAL OF IMPELLA LEFT VENTRICULAR ASSIST DEVICE Right 12/04/2019   Procedure: REMOVAL OF IMPELLA LEFT VENTRICULAR ASSIST DEVICE, right axilla;  Surgeon: Wonda Olds, MD;  Location: [place];  Service: Open Heart Surgery;  Laterality: Right;  . RIGHT/LEFT HEART CATH AND CORONARY ANGIOGRAPHY N/A 11/24/2019   Procedure: RIGHT/LEFT HEART CATH AND CORONARY ANGIOGRAPHY;  Surgeon: Larey Dresser, MD;  Location: [place] CV LAB;  Service: Cardiovascular;  Laterality: N/A;  . TEE WITHOUT CARDIOVERSION N/A 11/24/2019   Procedure: TRANSESOPHAGEAL ECHOCARDIOGRAM (TEE);  Surgeon: Wonda Olds, MD;  Location: [place];  Service: Open Heart Surgery;  Laterality: N/A;  . TEE WITHOUT CARDIOVERSION N/A 11/26/2019   Procedure: TRANSESOPHAGEAL ECHOCARDIOGRAM (TEE);  Surgeon: Wonda Olds, MD;  Location: [place];  Service: Open Heart Surgery;  Laterality: N/A;     Home Medications:  Prior to Admission medications   Medication Sig Start Date End Date Taking? Authorizing Provider  carvedilol (COREG) 6.25 MG tablet Take 1 tablet (6.25 mg total) by mouth 2 (two) times daily with a meal. 05/24/20  Yes Verta Ellen., NP  clopidogrel (PLAVIX) 75 MG tablet Take 1 tablet (75 mg total) by mouth daily. 05/24/20  Yes Verta Ellen., NP  COMBIVENT RESPIMAT 20-100 MCG/ACT AERS respimat Inhale 1 puff into Kevin lungs every 6 (six) hours as needed. 12/08/19  Yes Gold,  Patrick Jupiter E, PA-C  digoxin (LANOXIN) 0.125 MG tablet Take 1 tablet (0.125 mg total) by mouth daily. 05/24/20  Yes Verta Ellen., NP  furosemide (LASIX) 80 MG tablet Take 1 tablet (80 mg total) by mouth daily. Patient taking differently: Take 80 mg by mouth daily. 10m in Kevin am 469min Kevin evening 12/13/19 12/12/20 Yes Atkins, BrGlenice BowMD  gabapentin (NEURONTIN) 100 MG capsule Take by mouth 3 (three) times daily as needed. 06/08/20  Yes [provider]  gabapentin (NEURONTIN) 600 MG tablet SMARTSIG:1 Tablet(s) By Mouth Every 12 Hours 06/08/20  Yes [provider]  INVOKAMET XR [phone] MG TB24 Take 2 tablets by mouth daily. 01/17/20  Yes [provider]  metFORMIN (GLUCOPHAGE) 500 MG tablet Take 1,000 mg by mouth 2 (two) times daily with a meal.   Yes [provider]  sacubitril-valsartan (ENTRESTO) 24-26 MG Take 1 tablet by mouth 2 (two) times daily. 05/24/20  Yes QuVerta Ellen NP  traZODone (DESYREL) 50 MG tablet Take 50 mg by mouth at bedtime.   Yes  [provider]  amiodarone (PACERONE) 200 MG tablet Take 1 tablet (200 mg total) by mouth daily. Needs appt for further refills 06/26/20   Darrick Grinder D, NP  aspirin 81 MG tablet Take 81 mg by mouth daily.    [provider]  colchicine 0.6 MG tablet Take 1 tablet (0.6 mg total) by mouth daily. 12/09/19   Gold, Wayne E, PA-C  glimepiride (AMARYL) 2 MG tablet Take 2 mg by mouth daily. Reported on 11/28/2015    [provider]  magnesium oxide (MAG-OX) 400 (241.3 Mg) MG tablet Take 1 tablet (400 mg total) by mouth 2 (two) times daily. 12/08/19   John Giovanni, PA-C  Multiple Vitamin (MULTIVITAMIN WITH MINERALS) TABS tablet Take 1 tablet by mouth daily. 12/09/19   Gold, Patrick Jupiter E, PA-C  potassium chloride SA (KLOR-CON) 20 MEQ tablet Take 1 tablet (20 mEq total) by mouth daily. 12/08/19   Gold, Patrick Jupiter E, PA-C  rosuvastatin (CRESTOR) 40 MG tablet TAKE 1 TABLET(40 MG) BY MOUTH DAILY AT 6 PM 05/02/20    Ivin Poot, MD  spironolactone (ALDACTONE) 25 MG tablet Take 1 tablet (25 mg total) by mouth daily. 12/09/19   Gold, Wilder Glade, PA-C  SYMBICORT 160-4.5 MCG/ACT inhaler Inhale 2 puffs into Kevin lungs 2 (two) times daily.  03/30/19   [provider]    Inpatient Medications: Scheduled Meds: . alum & mag hydroxide-simeth  30 mL Oral Once   And  . lidocaine  15 mL Oral Once  . amiodarone  200 mg Oral Daily  . aspirin EC  81 mg Oral Daily  . budesonide (PULMICORT) nebulizer solution  0.5 mg Nebulization BID  . carvedilol  6.25 mg Oral BID WC  . clopidogrel  75 mg Oral Daily  . digoxin  0.125 mg Oral Daily  . furosemide  40 mg Intravenous BID  . furosemide  80 mg Intravenous Once  . insulin aspart  0-15 Units Subcutaneous TID WC  . insulin aspart  0-5 Units Subcutaneous QHS  . insulin detemir  12 Units Subcutaneous BID  . ipratropium-albuterol  3 mL Nebulization QID  . magnesium oxide  400 mg Oral BID  . methylPREDNISolone (SOLU-MEDROL) injection  60 mg Intravenous Q12H  . multivitamin with minerals  1 tablet Oral Daily  . pantoprazole  40 mg Oral Daily  . potassium chloride SA  20 mEq Oral Daily  . rosuvastatin  40 mg Oral Daily  . sacubitril-valsartan  1 tablet Oral BID   Continuous Infusions: . heparin 1,000 Units/hr (09/22/20 0354)   PRN Meds:   Allergies:   No Known Allergies  Social History:   Social History   Socioeconomic History  . Marital status: Married    Spouse name: Not on file  . Number of children: Not on file  . Years of education: 74  . Highest education level: Not on file  Occupational History  . Not on file  Tobacco Use  . Smoking status: Former Smoker    Packs/day: 1.00    Years: 30.00    Pack years: 30.00    Types: Cigarettes    Quit date: 12/20/2019    Years since quitting: 0.7  . Smokeless tobacco: Never Used  Vaping Use  . Vaping Use: Never used  Substance and Sexual Activity  . Alcohol use: Yes    Comment: Prior history of  excessive intake  . Drug use: No  . Sexual activity: Not on file  Other Topics Concern  . Not on  file  Social History Narrative  . Not on file   Social Determinants of Health   Financial Resource Strain:   . Difficulty of Paying Living Expenses: Not on file  Food Insecurity:   . Worried About Charity fundraiser in Kevin Last Year: Not on file  . Ran Out of Food in Kevin Last Year: Not on file  Transportation Needs:   . Lack of Transportation (Medical): Not on file  . Lack of Transportation (Non-Medical): Not on file  Physical Activity:   . Days of Exercise per Week: Not on file  . Minutes of Exercise per Session: Not on file  Stress:   . Feeling of Stress : Not on file  Social Connections:   . Frequency of Communication with Friends and Family: Not on file  . Frequency of Social Gatherings with Friends and Family: Not on file  . Attends Religious Services: Not on file  . Active Member of Clubs or Organizations: Not on file  . Attends Archivist Meetings: Not on file  . Marital Status: Not on file  Intimate Partner Violence:   . Fear of Current or Ex-Partner: Not on file  . Emotionally Abused: Not on file  . Physically Abused: Not on file  . Sexually Abused: Not on file     Family History:    Family History  Problem Relation Age of Onset  . Arthritis Other   . Lung disease Other   . Asthma Other   . Diabetes Other   . Heart disease Sister        Tumor?      Review of Systems    General:  No chills, fever, night sweats or weight changes.  Cardiovascular:  No palpitations. Positive for chest pain, dyspnea on exertion, edema and orthopnea. Dermatological: No rash, lesions/masses Respiratory: No cough, Positive for dyspnea. Urologic: No hematuria, dysuria Abdominal:   No nausea, vomiting, diarrhea, bright red blood per rectum, melena, or hematemesis Neurologic:  No visual changes, wkns, changes in mental status. All other systems reviewed and are otherwise  negative except as noted above.  Physical Exam/Data    Vitals:   09/22/20 0500 09/22/20 0530 09/22/20 0600 09/22/20 0630  BP: 127/76 111/84 133/87 118/82  Pulse: (!) 53 (!) 51 (!) 104 (!) 50  Resp: (!) 21 (!) _0 SpO2: 94% 94% 96% 95%  Weight:      Height:       No intake or output data in Kevin 24 hours ending 09/22/20 0901 Filed Weights   09/22/20 0314  Weight: 79.4 kg   Body mass index is 25.11 kg/m.   General: Pleasant male appearing in NAD Psych: Normal affect. Neuro: Alert and oriented X 3. Moves all extremities spontaneously. HEENT: Normal  Neck: Supple without bruits. JVD at 10 cm. Lungs:  Resp regular and unlabored, rales along bases bilaterally and expiratory wheezing along upper lung fields. Heart: RRR no s3, s4, 2/6 holosystolic murmur along Apex. Abdomen: Soft, non-tender, appears distended.  Extremities: No clubbing or cyanosis. No lower extremity edema. DP/Underwood/Radials 2+ and equal bilaterally.   EKG:  Kevin EKG was personally reviewed and demonstrates:  NSR, HR 99 with PAC's and TWI along Kevin lateral leads which is similar to prior tracings.   Telemetry:  Telemetry was personally reviewed and demonstrates: Sinus tachycardia, HR in low-100's to 110's with occasional PAC's and PVC's.    Labs/Studies     Relevant CV Studies:  Cardiac Catheterization: 11/2019 1. Low  output HF with elevated filling pressures.  2. Severe 3 vessel disease with heavily calcified and severely stenotic proximal LAD.    I will increase IV Lasix and start him on milrinone 0.25 to promote diuresis.   Diabetic with 3VD including proximal LAD. He will need cardiac surgery evaluation.  If not taken for surgery, PCI would be an option (LAD lesion would require atherectomy).   Echocardiogram: 11/2019 IMPRESSIONS    1. Left ventricular ejection fraction, by visual estimation, is <20%. Kevin  left ventricle has severely decreased function. There is no left  ventricular  hypertrophy.  2. Left ventricular diastolic parameters are indeterminate.  3. Kevin left ventricle demonstrates global hypokinesis.  4. Global right ventricle has mildly reduced systolic function.Kevin right  ventricular size is mildly enlarged. No increase in right ventricular wall  thickness.  5. Left atrial size was moderately dilated.  6. Right atrial size was severely dilated.  7. Kevin mitral valve is abnormal. Mild mitral valve regurgitation.  8. Kevin tricuspid valve is grossly normal. Tricuspid valve regurgitation  is mild.  9. Kevin aortic valve is tricuspid. Aortic valve regurgitation is not  visualized.  10. Kevin pulmonic valve was grossly normal. Pulmonic valve regurgitation is  trivial.  11. Moderately elevated pulmonary artery systolic pressure.  12. Kevin inferior vena cava is dilated in size with <50% respiratory  variability, suggesting right atrial pressure of 15 mmHg.    Echocardiogram: 09/21/2020 IMPRESSIONS    1. Left ventricular ejection fraction, by estimation, is 20 to 25%. Kevin  left ventricle has severely decreased function. Kevin left ventricle  demonstrates global hypokinesis. Kevin left ventricular internal cavity size  was mildly dilated. Left ventricular  diastolic parameters are indeterminate.  2. Right ventricular systolic function is mildly reduced. Kevin right  ventricular size is mildly enlarged.  3. Left atrial size was moderately dilated.  4. Right atrial size was moderately dilated.  5. Kevin pericardial effusion is circumferential.  6. Kevin mitral valve is abnormal. Moderate mitral valve regurgitation. No  evidence of mitral stenosis.  7. Kevin tricuspid valve is abnormal. Tricuspid valve regurgitation is  moderate.  8. Kevin aortic valve is tricuspid. Aortic valve regurgitation is not  visualized. No aortic stenosis is present.  9. Kevin inferior vena cava is normal in size with greater than 50%  respiratory variability, suggesting right atrial  pressure of 3 mmHg.   Laboratory Data:  Chemistry Recent Labs  Lab 09/22/20 0348  NA 134*  K 3.5  CL 100  CO2 24  GLUCOSE 296*  BUN 22*  CREATININE 0.73  CALCIUM 8.7*  GFRNONAA >60  ANIONGAP 10    No results for input(s): PROT, ALBUMIN, AST, ALT, ALKPHOS, BILITOT in Kevin last 168 hours. Hematology Recent Labs  Lab 09/22/20 0348  WBC 8.2  RBC 4.25  HGB 12.3*  HCT 36.4*  MCV 85.6  MCH 28.9  MCHC 33.8  RDW 15.4  PLT 226   Cardiac EnzymesNo results for input(s): TROPONINI in Kevin last 168 hours. No results for input(s): TROPIPOC in Kevin last 168 hours.  BNP Recent Labs  Lab 09/22/20 0348  BNP 939.0*    DDimer No results for input(s): DDIMER in Kevin last 168 hours.  Radiology/Studies:  DG Chest Port 1 View  Result Date: 09/22/2020 CLINICAL DATA:  Chest pain. EXAM: PORTABLE CHEST 1 VIEW COMPARISON:  One-view chest x-ray 08/27/2020 FINDINGS: Heart is enlarged. Median sternotomy noted. A diffuse interstitial pattern is present. Minimal atelectasis present at bases. No other significant airspace consolidation  is present. Surgical clips are present at Kevin right axilla. IMPRESSION: 1. Cardiomegaly with mild interstitial edema, consistent with congestive heart failure. 2. Minimal bibasilar atelectasis. Electronically Signed   By: San Morelle M.D.   On: 09/22/2020 04:23    Assessment & Plan    1. Acute on Chronic Systolic CHF - Presents with a 2-week history of progressive orthopnea, dyspnea on exertion and abdominal distension. Unsure of weight changes on his home scales. Reports compliance with his medications.  - Repeat echo performed yesterday showed his EF remains reduced at 20 to 25% with global hypokinesis and mildly reduced RV function. He did have a trivial pericardial effusion and biatrial dilation. Also noted to have moderate MR and moderate TR.  - He is volume overloaded on examination. Will add BNP to AM labs. Will dose IV Lasix 1m this AM then start 427m BID. Follow I&O's along with daily weights. If renal function worsens or poor response with diuretics, would have [facility] Failure team given his significant cardiomyopathy.  - He has been continued on Coreg 6.2576mID, Digoxin 0.125m48mily (will check Dig level) and Entresto 24-26mg72m. Would add Spironolactone prior to discharge if BP allows. Will also need to consider EP referral as an outpatient for consideration of ICD placement.   2. Pleuritic Chest Pain/CAD - He is s/p CABG in 11/2019 with LIMA-LAD, SVG-OM1, SVG-PDA and SVG-D1. HS Troponin values are flat at 35 and 33 this admission which is most consistent with demand ischemia in Kevin setting of his acute CHF exacerbation.  - His chest pain seems atypical for ischemia as it is worse with coughing and deep inspiration. Trivial pericardial effusion noted on recent echo. Will check ESR and CRP. Continue to follow symptoms with treatment of COPD and CHF. Can continue with IV Heparin for now.  - Remains on ASA 81mg 57my, Plavix 75mg d38m, Coreg 6.25mg BI66md Crestor 40mg dai42m  3. History of Paroxysmal Atrial Fibrillation - Occurring in 11/2019 and he underwent MAZE and clipping of atrial appendage at Kevin time of CABG. Maintaining NSR by review of telemetry. Remains on Amiodarone 200mg dail104mt will review with MD in regards to discontinuing this.   4. HTN - BP has been variable from 94/68 - 133/87 while in Kevin ED. Continue to follow after receiving AM medications. Remains on Coreg and Entresto.   5. HLD - Repeat FLP pending. On Crestor 40mg daily79man outpatient.   6. COPD - He does have some expiratory wheezing on examination. Reviewed with Kevin admitting team and they will order breathing treatments.     For questions or updates, please contact [facility] [email or web address] for contact info under Cardiology/STEMI.  Signed, Brittany M Erma Heritage15/2021, 9:01 AM Pager: [phone] and examined  I agree with findings as noted by B Strader above  Underwood is a 59 yo with 64own CAD   S/P CABG in Dec 2020   Also hx of PAF, chronic ssytolic CHF, HTN, HL , DM  Recent ech oon 10/14 with LVEF 20 to 25%   Mildly reduced RV function    Mod MR and TR.    REcomm increasing lasix and follow up in clinic on Monday    Kevin Underwood could not wait and presented to ED due to increased DOE and rest for 2 wks   And last night developed chest pressure   Pressure  worse with coughing   Underwood volume overloaded on arrival   Has received IV lasix by Kevin time I saw him with 1 urinal full  Breathing better but not at baseline   On exam, Underwood appears rel comfortable   Still with some mild pressure Neck  Increased JVP  Lungs with mild rales at bases Cardiac exam  RRR   S1, S2   II/VI systolic murmur apex Abd with mild RUQ tenderness Ext with trival edema in feet  I agree with admit for CHF exacerbation.   Would continue IV diuresis  Would have dietary review diet (low Na, 1500 cc, low carb, low chol) with Underwood and wife Troponin is flat   I would stop IV heparin He came in on amiodarone   I dont think this was stopped after CABG  I would stop now and follow   Dorris Carnes MD

## 2020-09-22 NOTE — H&P (Signed)
History and Physical    Kevin Underwood FIE:[id number] DOB: May 01, 1961 DOA: 09/22/2020  PCP: Kirstie Peri, MD   Patient coming from: home  I have personally briefly reviewed patient's old medical records in [facility]  Chief Complaint: SOB and chest pain.  HPI: Kevin Underwood is a 59 y.o. male with medical history significant of paroxysmal atrial fibrillation, combined systolic HF, CAD (status post CABG), HTN, HLD, type 2 diabetes, COPD and medication non-compliance; who presented to ED with complaints of SOB (at rest and exertion), orthopnea, non productive cough and chest pain. Patient reported having pain on and off for the last 2 weeks, mainly on exertion, associated with SOB, 7-8/10 in intensity, localized in the middle of his chest and described as pressure type. Pain is non radiated and he reported it ease off with the use of aspirin and NTG.   Patient denies fever, chills, nausea, vomiting, dysuria, hematuria, melena or hematochezia.   ED Course: abnormal EKG, mildly elevated troponin, vascular congestion on CXR and elevated BNP appreciated. Patient was also wheezing tremendously and having difficulty speaking in full sentences. Initially started on heparin drip and NTG paste by EDP; then after seen by cardiology service heparin was discontinue by rec's and patient treated for CHF exacerbation with demand ischemia and COPD.  Review of Systems: As per HPI otherwise all other systems reviewed and are negative.   Past Medical History:  Diagnosis Date  . Arthritis   . CAD (coronary artery disease)    a. s/p CABG in 11/2019 with LIMA-LAD, SVG-OM1, SVG-PDA and SVG-D1  . CHF (congestive heart failure) (HCC)    a. EF < 20% by echo in 11/2019  . COPD (chronic obstructive pulmonary disease) (HCC)   . Essential hypertension   . Type 2 diabetes mellitus (HCC)     Past Surgical History:  Procedure Laterality Date  . BACK SURGERY    . CLIPPING OF ATRIAL APPENDAGE N/A  11/26/2019   Procedure: Clipping Of Atrial Appendage using AtriCure 40 Clip;  Surgeon: [name], MD;  Location: [facility] OR;  Service: Open Heart Surgery;  Laterality: N/A;  . CORONARY ARTERY BYPASS GRAFT N/A 11/26/2019   Procedure: CORONARY ARTERY BYPASS GRAFTING (CABG) using endoscopic greater saphenous vein harvest: svc to OM; svc to Diag; svc to PD; and LIMA to LAD.;  Surgeon: [name], MD;  Location: [facility] OR;  Service: Open Heart Surgery;  Laterality: N/A;  . FOOT SURGERY    . HAND SURGERY    . INTRAOPERATIVE TRANSESOPHAGEAL ECHOCARDIOGRAM  12/04/2019   Procedure: Intraoperative Transesophageal Echocardiogram;  Surgeon: [name], MD;  Location: [facility] OR;  Service: Open Heart Surgery;;  . MAZE N/A 11/26/2019   Procedure: MAZE using Bilateral Pulmonary Vein isolation.;  Surgeon: [name], MD;  Location: [facility] OR;  Service: Open Heart Surgery;  Laterality: N/A;  . PLACEMENT OF IMPELLA LEFT VENTRICULAR ASSIST DEVICE N/A 11/24/2019   Procedure: PLACEMENT OF IMPELLA 5.5 LEFT VENTRICULAR ASSIST DEVICE;  Surgeon: [name], MD;  Location: [facility] OR;  Service: Open Heart Surgery;  Laterality: N/A;  . REMOVAL OF IMPELLA LEFT VENTRICULAR ASSIST DEVICE Right 12/04/2019   Procedure: REMOVAL OF IMPELLA LEFT VENTRICULAR ASSIST DEVICE, right axilla;  Surgeon: [name], MD;  Location: [place] OR;  Service: Open Heart Surgery;  Laterality: Right;  . RIGHT/LEFT HEART CATH AND CORONARY ANGIOGRAPHY N/A 11/24/2019   Procedure: RIGHT/LEFT HEART CATH AND CORONARY ANGIOGRAPHY;  Surgeon: Laurey Morale, MD;  Location: [facility] INVASIVE CV  LAB;  Service: Cardiovascular;  Laterality: N/A;  . TEE WITHOUT CARDIOVERSION N/A 11/24/2019   Procedure: TRANSESOPHAGEAL ECHOCARDIOGRAM (TEE);  Surgeon: [name], MD;  Location: [facility] OR;  Service: Open Heart Surgery;  Laterality: N/A;  . TEE WITHOUT CARDIOVERSION N/A 11/26/2019   Procedure: TRANSESOPHAGEAL ECHOCARDIOGRAM (TEE);  Surgeon: [name], MD;  Location: [facility] OR;  Service: Open Heart Surgery;  Laterality: N/A;    Social History  reports that he quit smoking about 9 months ago. His smoking use included cigarettes. He has a 30.00 pack-year smoking history. He has never used smokeless tobacco. He reports current alcohol use. He reports that he does not use drugs.  No Known Allergies  Family History  Problem Relation Age of Onset  . Arthritis Other   . Lung disease Other   . Asthma Other   . Diabetes Other   . Heart disease Sister        Tumor?    Prior to Admission medications   Medication Sig Start Date End Date Taking? Authorizing Provider  albuterol (PROVENTIL) (2.5 MG/3ML) 0.083% nebulizer solution Take 2.5 mg by nebulization every 6 (six) hours as needed for wheezing or shortness of breath.  08/25/20  Yes [provider]  carvedilol (COREG) 6.25 MG tablet Take 1 tablet (6.25 mg total) by mouth 2 (two) times daily with a meal. 05/24/20  Yes [name]., NP  clopidogrel (PLAVIX) 75 MG tablet Take 1 tablet (75 mg total) by mouth daily. 05/24/20  Yes [name]., NP  COMBIVENT RESPIMAT 20-100 MCG/ACT AERS respimat Inhale 1 puff into the lungs every 6 (six) hours as needed. 12/08/19  Yes Gold, Deniece Portela E, PA-C  digoxin (LANOXIN) 0.125 MG tablet Take 1 tablet (0.125 mg total) by mouth daily. 05/24/20  Yes [name]., NP  furosemide (LASIX) 80 MG tablet Take 1 tablet (80 mg total) by mouth daily. Patient taking differently: Take 40-80 mg by mouth See admin instructions. 80mg  in the am 40mg  in the evening 12/13/19 12/12/20 Yes Atkins, 02/10/20, MD  gabapentin (NEURONTIN) 100 MG capsule Take by mouth 3 (three) times daily as needed (Nerve pain).  06/08/20  Yes [provider]  gabapentin (NEURONTIN) 600 MG tablet Take 600 mg by mouth 2 (two) times daily.  06/08/20  Yes [provider]  INVOKAMET XR [id number] MG TB24 Take 2 tablets by mouth daily. 01/17/20  Yes [provider]    metFORMIN (GLUCOPHAGE) 500 MG tablet Take 1,000 mg by mouth 2 (two) times daily with a meal.   Yes [provider]  Multiple Vitamin (MULTIVITAMIN WITH MINERALS) TABS tablet Take 1 tablet by mouth daily. 12/09/19  Yes Gold, Wayne E, PA-C  sacubitril-valsartan (ENTRESTO) 24-26 MG Take 1 tablet by mouth 2 (two) times daily. 05/24/20  Yes 12/11/19., NP  traZODone (DESYREL) 50 MG tablet Take 50 mg by mouth at bedtime.   Yes [provider]  amiodarone (PACERONE) 200 MG tablet Take 1 tablet (200 mg total) by mouth daily. Needs appt for further refills Patient not taking: Reported on 09/22/2020 06/26/20   09/24/2020 D, NP  colchicine 0.6 MG tablet Take 1 tablet (0.6 mg total) by mouth daily. Patient not taking: Reported on 09/22/2020 12/09/19   09/24/2020 E, PA-C  magnesium oxide (MAG-OX) 400 (241.3 Mg) MG tablet Take 1 tablet (400 mg total) by mouth 2 (two) times daily. Patient not taking: Reported on 09/22/2020 12/08/19   09/24/2020,  PA-C  potassium chloride SA (KLOR-CON) 20 MEQ tablet Take 1 tablet (20 mEq total) by mouth daily. Patient not taking: Reported on 09/22/2020 12/08/19   Gershon Crane E, PA-C  rosuvastatin (CRESTOR) 40 MG tablet TAKE 1 TABLET(40 MG) BY MOUTH DAILY AT 6 PM Patient not taking: Reported on 09/22/2020 05/02/20   Kerin Perna, MD    Physical Exam: Vitals:   09/22/20 1528 09/22/20 1530 09/22/20 1600 09/22/20 1630  BP: 115/86 124/83 122/75 111/69  Pulse: 92 93 90 88  Resp: 18 (!) 22 20 (!) 21  Temp: 98 F (36.7 C)     TempSrc: Oral     SpO2: 100% 100% 93% 96%  Weight:      Height:        Constitutional: in mild distress due to SOB, no nausea, no vomiting, positive orthopnea and non productive coughing spells. Vitals:   09/22/20 1528 09/22/20 1530 09/22/20 1600 09/22/20 1630  BP: 115/86 124/83 122/75 111/69  Pulse: 92 93 90 88  Resp: 18 (!) 22 20 (!) 21  Temp: 98 F (36.7 C)     TempSrc: Oral     SpO2: 100% 100% 93% 96%   Weight:      Height:       Eyes: PERRL, lids and conjunctivae normal, no icterus, no nystagmus. ENMT: Mucous membranes are moist. Posterior pharynx clear of any exudate or lesions. Neck: normal, supple, no masses, no thyromegaly, positive JVD Respiratory: positive crackles at the bases, tachypneic, presence of bilat  rhonchi and exp wheezing. No using accessory muscles. Cardiovascular: Rate controlled, no rubs, no gallops; positive SEM. Abdomen: no tenderness, no masses palpated. Mild distension, Bowel sounds positive.  Musculoskeletal: no clubbing / cyanosis. No joint deformity upper and lower extremities. Good ROM, no contractures. Normal muscle tone.  Skin: no rashes, no petechiae  Neurologic: CN 2-12 grossly intact. Sensation intact, DTR normal. Strength 5/5 in all 4.  Psychiatric: Normal judgment and insight. Alert and oriented x 3. Normal mood.   Labs on Admission: I have personally reviewed following labs and imaging studies  CBC: Recent Labs  Lab 09/22/20 0348  WBC 8.2  NEUTROABS 5.1  HGB 12.3*  HCT 36.4*  MCV 85.6  PLT 226    Basic Metabolic Panel: Recent Labs  Lab 09/22/20 0348 09/22/20 0944  NA 134*  --   K 3.5  --   CL 100  --   CO2 24  --   GLUCOSE 296*  --   BUN 22*  --   CREATININE 0.73  --   CALCIUM 8.7*  --   MG  --  2.1    GFR: Estimated Creatinine Clearance: 102.7 mL/min (by C-G formula based on SCr of 0.73 mg/dL).  Urine analysis:    Component Value Date/Time   COLORURINE STRAW (A) 11/25/2019 2335   APPEARANCEUR CLEAR 11/25/2019 2335   LABSPEC 1.009 11/25/2019 2335   PHURINE 5.0 11/25/2019 2335   GLUCOSEU NEGATIVE 11/25/2019 2335   HGBUR LARGE (A) 11/25/2019 2335   BILIRUBINUR NEGATIVE 11/25/2019 2335   KETONESUR NEGATIVE 11/25/2019 2335   PROTEINUR NEGATIVE 11/25/2019 2335   NITRITE NEGATIVE 11/25/2019 2335   LEUKOCYTESUR TRACE (A) 11/25/2019 2335    Radiological Exams on Admission: DG Chest Port 1 View  Result Date:  09/22/2020 CLINICAL DATA:  Chest pain. EXAM: PORTABLE CHEST 1 VIEW COMPARISON:  One-view chest x-ray 08/27/2020 FINDINGS: Heart is enlarged. Median sternotomy noted. A diffuse interstitial pattern is present. Minimal atelectasis present at bases. No other significant  airspace consolidation is present. Surgical clips are present at the right axilla. IMPRESSION: 1. Cardiomegaly with mild interstitial edema, consistent with congestive heart failure. 2. Minimal bibasilar atelectasis. Electronically Signed   By: Marin Roberts M.D.   On: 09/22/2020 04:23   ECHOCARDIOGRAM COMPLETE  Result Date: 09/21/2020    ECHOCARDIOGRAM REPORT   Patient Name:   Kevin Underwood Date of Exam: 09/21/2020 Medical Rec #:  [id number]        Height:       70.0 in Accession #:    [id number]       Weight:       173.2 lb Date of Birth:  11-11-1961        BSA:          1.964 m Patient Age:    59 years         BP:           120/68 mmHg Patient Gender: M                HR:           101 bpm. Exam Location:  [place] Procedure: 2D Echo, Cardiac Doppler, Color Doppler and Strain Analysis Indications:    I25.110 Atherosclerotic heart disease of native coronary artery                 with unstable angina pectoris; I25.5 Ischemic cardiomyopathy;                 I50.22 Chronic systolic (congestive) heart failure; R06.02 SOB;                 R06.9 DOE  History:        Patient has prior history of Echocardiogram examinations, most                 recent 12/05/2019. CHF and Cardiomyopathy, CAD, Prior CABG, OSA                 and COPD, Arrythmias:Atrial Fibrillation,                 Signs/Symptoms:Shortness of Breath and Dyspnea; Risk                 Factors:Hypertension, Diabetes and Former Smoker. C/o episode of                 chest pain about a month ago -went to ER. Left AMA because                 "there were no beds.".  Sonographer:    Vella Kohler BS, RVT, RDCS Referring Phys: [phone] [name].  Sonographer Comments: Global  longitudinal strain was attempted. IMPRESSIONS  1. Left ventricular ejection fraction, by estimation, is 20 to 25%. The left ventricle has severely decreased function. The left ventricle demonstrates global hypokinesis. The left ventricular internal cavity size was mildly dilated. Left ventricular diastolic parameters are indeterminate.  2. Right ventricular systolic function is mildly reduced. The right ventricular size is mildly enlarged.  3. Left atrial size was moderately dilated.  4. Right atrial size was moderately dilated.  5. The pericardial effusion is circumferential.  6. The mitral valve is abnormal. Moderate mitral valve regurgitation. No evidence of mitral stenosis.  7. The tricuspid valve is abnormal. Tricuspid valve regurgitation is moderate.  8. The aortic valve is tricuspid. Aortic valve regurgitation is not visualized. No aortic stenosis is present.  9. The inferior vena cava is normal  in size with greater than 50% respiratory variability, suggesting right atrial pressure of 3 mmHg. Comparison(s): Prior Echo showed LV EF 20-25%, severely decreased function. Grade II diastolic dysfunction. Moderately reduced RV systolic function. PASP 31.5. FINDINGS  Left Ventricle: Left ventricular ejection fraction, by estimation, is 20 to 25%. The left ventricle has severely decreased function. The left ventricle demonstrates global hypokinesis. The left ventricular internal cavity size was mildly dilated. There is no left ventricular hypertrophy. Left ventricular diastolic parameters are indeterminate. Right Ventricle: The right ventricular size is mildly enlarged. Right vetricular wall thickness was not assessed. Right ventricular systolic function is mildly reduced. Left Atrium: Left atrial size was moderately dilated. Right Atrium: Right atrial size was moderately dilated. Pericardium: Trivial pericardial effusion is present. The pericardial effusion is circumferential. Mitral Valve: The mitral valve is  abnormal. Moderate mitral valve regurgitation. No evidence of mitral valve stenosis. Tricuspid Valve: The tricuspid valve is abnormal. Tricuspid valve regurgitation is moderate . No evidence of tricuspid stenosis. Aortic Valve: The aortic valve is tricuspid. Aortic valve regurgitation is not visualized. No aortic stenosis is present. Pulmonic Valve: The pulmonic valve was not well visualized. Pulmonic valve regurgitation is not visualized. No evidence of pulmonic stenosis. Aorta: The aortic root is normal in size and structure. Pulmonary Artery: Moderate pulmonary HTN, PASP is 50 mmHg. Venous: The inferior vena cava is normal in size with greater than 50% respiratory variability, suggesting right atrial pressure of 3 mmHg. IAS/Shunts: No atrial level shunt detected by color flow Doppler.  LEFT VENTRICLE PLAX 2D LVIDd:         5.97 cm      Diastology LVIDs:         5.28 cm      LV e' medial:    7.35 cm/s LV PW:         1.29 cm      LV E/e' medial:  14.7 LV IVS:        1.11 cm      LV e' lateral:   9.19 cm/s LVOT diam:     2.00 cm      LV E/e' lateral: 11.8 LV SV:         46 LV SV Index:   24 LVOT Area:     3.14 cm  LV Volumes (MOD) LV vol d, MOD A2C: 140.0 ml LV vol d, MOD A4C: 136.0 ml LV vol s, MOD A2C: 95.8 ml LV vol s, MOD A4C: 97.8 ml LV SV MOD A2C:     44.2 ml LV SV MOD A4C:     136.0 ml LV SV MOD BP:      40.5 ml RIGHT VENTRICLE RV S prime:     9.77 cm/s TAPSE (M-mode): 1.3 cm LEFT ATRIUM             Index LA diam:        4.40 cm 2.24 cm/m LA Vol (A2C):           36.10 ml/m LA Vol (A4C):           34.40 ml/m LA Biplane Vol:         36.70 ml/m  AORTIC VALVE             PULMONIC VALVE LVOT Vmax:   96.00 cm/s  PV Vmax:       0.70 m/s LVOT Vmean:  65.200 cm/s PV Peak grad:  1.9 mmHg LVOT VTI:    0.148 m  AORTA Ao Root diam: 3.80 cm  Ao Asc diam:  3.50 cm MITRAL VALVE                 TRICUSPID VALVE MV Area (PHT): 7.37 cm      TR Peak grad:   46.0 mmHg MV Decel Time: 103 msec      TR Vmax:        339.00 cm/s  MR Peak grad:    79.9 mmHg MR Mean grad:    49.5 mmHg   SHUNTS MR Vmax:         447.00 cm/s Systemic VTI:  0.15 m MR Vmean:        326.5 cm/s  Systemic Diam: 2.00 cm MR PISA Eff ROA: 16 mm MV E velocity: 108.00 cm/s MV A velocity: 32.50 cm/s MV E/A ratio:  3.32 Dina Rich MD Electronically signed by Dina Rich MD Signature Date/Time: 09/21/2020/6:09:54 PM    Final     EKG: Independently reviewed. Normal sinus rhythm, borderline tachycardia and similar to previous tracings TWI in lateral leads.  Assessment/Plan 1-acute on chronic systolic HF -admit to telemetry -follow daily weights and strict I's and O's -low sodium diet -will continue b-blocker, digoxin and entresto -started on IV lasix and will follow cardiology rec's  2-COPD exacerbation -will use duoneb nebulizer management, steroids and pulmicort, started also on flutter valve -no antibiotics needed currently -prn O2 supplementation will be provided.  3-CP/elevated troponin -demand ischemia from CHF  -heparin discontinue by cards rec's -continue treatment with ASA, plavix, b-blocker and statins -will follow response  4-PAF -sinus rhythm currently -continue b-blocker and digoxin -heparin for DVT prophylaxis ordered  5-GERD -continue PPI  6-HLD -continue statins -check lipid panel  7-HTN -stable overall -continue current antihypertensive regimen and follow VS  8-type 2 diabetes with hyperglycemia Will check A1C -holding oral hypoglycemic agents -will use SSI and levemir -anticipate elevated CBG's with use of steroids.   DVT prophylaxis: heparin Code Status:   Full code Family Communication:  No family at bedside  Disposition Plan:   Patient is from:  home  Anticipated DC to:  home  Anticipated DC date:  09/23/20  Anticipated DC barriers: CHF/COPD stabilization. Consults called:  Cardiology  Admission status:  Observation, telmetry, LOS < 2 midnights.  Severity of Illness: Moderate severeity,  admitted as observation for CP and CHF exacerbation; if patient failed to improved for outpatient management in 24-48 hours will transition to inpatient status.  Vassie Loll MD [facility]  How to contact the [facility] Attending or Consulting provider 7A - 7P or covering provider during after hours 7P -7A, for this patient?   1. Check the care team in [facility] and look for a) attending/consulting [facility] provider listed and b) the [facility] team listed 2. Log into [email or web address] and use [place]'s universal password to access. If you do not have the password, please contact the hospital operator. 3. Locate the [facility] provider you are looking for under [facility] and page to a number that you can be directly reached. 4. If you still have difficulty reaching the provider, please page the [facility] (Director on Call) for the Hospitalists listed on amion for assistance.  09/22/2020, 6:26 PM

## 2020-09-22 NOTE — ED Notes (Signed)
ED TO INPATIENT HANDOFF REPORT  ED Nurse Name and Phone #:  [phone]  S Name/Age/Gender Phoebe Sharps 59 y.o. male Room/Bed: APA04/APA04  Code Status   Code Status: Full Code  Home/SNF/Other Home Patient oriented to: self, place, time and situation Is this baseline? Yes   Triage Complete: Triage complete  Chief Complaint Unstable angina (HCC) [I20.0]  Triage Note Pt c/o chest pain starting around 5pm last night while sitting at home. Pt took ASA prior to EMS arrival. Given 2 nitro enroute, pain decreasing from 9 to 5. Pain worse with movement and coughing.    Allergies No Known Allergies  Level of Care/Admitting Diagnosis ED Disposition    ED Disposition Condition Comment   Admit  Hospital Area: [facility] [[id number]]  Level of Care: Telemetry [5]  Covid Evaluation: Confirmed COVID Negative  Diagnosis: Unstable angina [facility]) [[id number]]  Admitting Physician: Lilyan Gilford [[id number]]  Attending Physician: Lilyan Gilford [[id number]]       B Medical/Surgery History Past Medical History:  Diagnosis Date  . Arthritis   . CAD (coronary artery disease)    a. s/p CABG in 11/2019 with LIMA-LAD, SVG-OM1, SVG-PDA and SVG-D1  . CHF (congestive heart failure) (HCC)    a. EF < 20% by echo in 11/2019  . COPD (chronic obstructive pulmonary disease) (HCC)   . Essential hypertension   . Type 2 diabetes mellitus (HCC)    Past Surgical History:  Procedure Laterality Date  . BACK SURGERY    . CLIPPING OF ATRIAL APPENDAGE N/A 11/26/2019   Procedure: Clipping Of Atrial Appendage using AtriCure 40 Clip;  Surgeon: [name], MD;  Location: [facility] OR;  Service: Open Heart Surgery;  Laterality: N/A;  . CORONARY ARTERY BYPASS GRAFT N/A 11/26/2019   Procedure: CORONARY ARTERY BYPASS GRAFTING (CABG) using endoscopic greater saphenous vein harvest: svc to OM; svc to Diag; svc to PD; and LIMA to LAD.;  Surgeon: [name], MD;  Location: [facility] OR;  Service: Open  Heart Surgery;  Laterality: N/A;  . FOOT SURGERY    . HAND SURGERY    . INTRAOPERATIVE TRANSESOPHAGEAL ECHOCARDIOGRAM  12/04/2019   Procedure: Intraoperative Transesophageal Echocardiogram;  Surgeon: [name], MD;  Location: [facility] OR;  Service: Open Heart Surgery;;  . MAZE N/A 11/26/2019   Procedure: MAZE using Bilateral Pulmonary Vein isolation.;  Surgeon: [name], MD;  Location: [facility] OR;  Service: Open Heart Surgery;  Laterality: N/A;  . PLACEMENT OF IMPELLA LEFT VENTRICULAR ASSIST DEVICE N/A 11/24/2019   Procedure: PLACEMENT OF IMPELLA 5.5 LEFT VENTRICULAR ASSIST DEVICE;  Surgeon: [name], MD;  Location: [facility] OR;  Service: Open Heart Surgery;  Laterality: N/A;  . REMOVAL OF IMPELLA LEFT VENTRICULAR ASSIST DEVICE Right 12/04/2019   Procedure: REMOVAL OF IMPELLA LEFT VENTRICULAR ASSIST DEVICE, right axilla;  Surgeon: [name], MD;  Location: [facility] OR;  Service: Open Heart Surgery;  Laterality: Right;  . RIGHT/LEFT HEART CATH AND CORONARY ANGIOGRAPHY N/A 11/24/2019   Procedure: RIGHT/LEFT HEART CATH AND CORONARY ANGIOGRAPHY;  Surgeon: Laurey Morale, MD;  Location: [facility] INVASIVE CV LAB;  Service: Cardiovascular;  Laterality: N/A;  . TEE WITHOUT CARDIOVERSION N/A 11/24/2019   Procedure: TRANSESOPHAGEAL ECHOCARDIOGRAM (TEE);  Surgeon: [name], MD;  Location: [facility] OR;  Service: Open Heart Surgery;  Laterality: N/A;  . TEE WITHOUT CARDIOVERSION N/A 11/26/2019   Procedure: TRANSESOPHAGEAL ECHOCARDIOGRAM (TEE);  Surgeon: [name], MD;  Location: [facility] OR;  Service: Open Heart Surgery;  Laterality:  N/A;     A IV Location/Drains/Wounds Patient Lines/Drains/Airways Status    Active Line/Drains/Airways    Name Placement date Placement time Site Days   Peripheral IV 09/22/20 Left Antecubital 09/22/20  0319  Antecubital  less than 1          Intake/Output Last 24 hours  Intake/Output Summary (Last 24 hours) at 09/22/2020 1512 Last data filed at  09/22/2020 1200 Gross per 24 hour  Intake --  Output 1200 ml  Net -1200 ml    Labs/Imaging Results for orders placed or performed during the hospital encounter of 09/22/20 (from the past 48 hour(s))  Basic metabolic panel     Status: Abnormal   Collection Time: 09/22/20  3:48 AM  Result Value Ref Range   Sodium 134 (L) 135 - 145 mmol/L   Potassium 3.5 3.5 - 5.1 mmol/L   Chloride 100 98 - 111 mmol/L   CO2 24 22 - 32 mmol/L   Glucose, Bld 296 (H) 70 - 99 mg/dL    Comment: Glucose reference range applies only to samples taken after fasting for at least 8 hours.   BUN 22 (H) 6 - 20 mg/dL   Creatinine, Ser 1.61 0.61 - 1.24 mg/dL   Calcium 8.7 (L) 8.9 - 10.3 mg/dL   GFR, Estimated >09 >60 mL/min   Anion gap 10 5 - 15    Comment: Performed at [facility], [address] [place], [place] [address]  CBC with Differential     Status: Abnormal   Collection Time: 09/22/20  3:48 AM  Result Value Ref Range   WBC 8.2 4.0 - 10.5 K/uL   RBC 4.25 4.22 - 5.81 MIL/uL   Hemoglobin 12.3 (L) 13.0 - 17.0 g/dL   HCT 81.1 (L) 39 - 52 %   MCV 85.6 80.0 - 100.0 fL   MCH 28.9 26.0 - 34.0 pg   MCHC 33.8 30.0 - 36.0 g/dL   RDW 91.4 78.2 - 95.6 %   Platelets 226 150 - 400 K/uL   nRBC 0.0 0.0 - 0.2 %   Neutrophils Relative % 61 %   Neutro Abs 5.1 1.7 - 7.7 K/uL   Lymphocytes Relative 23 %   Lymphs Abs 1.9 0.7 - 4.0 K/uL   Monocytes Relative 11 %   Monocytes Absolute 0.9 0.1 - 1.0 K/uL   Eosinophils Relative 4 %   Eosinophils Absolute 0.3 0.0 - 0.5 K/uL   Basophils Relative 1 %   Basophils Absolute 0.1 0.0 - 0.1 K/uL   Immature Granulocytes 0 %   Abs Immature Granulocytes 0.02 0.00 - 0.07 K/uL    Comment: Performed at [facility], [address] [place], [place] [address]  Troponin I (High Sensitivity)     Status: Abnormal   Collection Time: 09/22/20  3:48 AM  Result Value Ref Range   Troponin I (High Sensitivity) 35 (H) <18 ng/L    Comment: (NOTE) Elevated high sensitivity troponin I (hsTnI)  values and significant  changes across serial measurements may suggest ACS but many other  chronic and acute conditions are known to elevate hsTnI results.  Refer to the "Links" section for chest pain algorithms and additional  guidance. Performed at [facility], [address], [place], [place] [address]   Respiratory Panel by RT PCR (Flu A&B, Covid) - Nasopharyngeal Swab     Status: None   Collection Time: 09/22/20  3:48 AM   Specimen: Nasopharyngeal Swab  Result Value Ref Range   SARS Coronavirus 2 by  RT PCR NEGATIVE NEGATIVE    Comment: (NOTE) SARS-CoV-2 target nucleic acids are NOT DETECTED.  The SARS-CoV-2 RNA is generally detectable in upper respiratoy specimens during the acute phase of infection. The lowest concentration of SARS-CoV-2 viral copies this assay can detect is 131 copies/mL. A negative result does not preclude SARS-Cov-2 infection and should not be used as the sole basis for treatment or other patient management decisions. A negative result may occur with  improper specimen collection/handling, submission of specimen other than nasopharyngeal swab, presence of viral mutation(s) within the areas targeted by this assay, and inadequate number of viral copies (<131 copies/mL). A negative result must be combined with clinical observations, patient history, and epidemiological information. The expected result is Negative.  Fact Sheet for Patients:  https://www.moore.com/  Fact Sheet for Healthcare Providers:  https://www.young.biz/  This test is no t yet approved or cleared by the [place] FDA and  has been authorized for detection and/or diagnosis of SARS-CoV-2 by FDA under an Emergency Use Authorization (EUA). This EUA will remain  in effect (meaning this test can be used) for the duration of the COVID-19 declaration under Section 564(b)(1) of the Act, 21 U.S.C. section 360bbb-3(b)(1), unless the authorization is  terminated or revoked sooner.     Influenza A by PCR NEGATIVE NEGATIVE   Influenza B by PCR NEGATIVE NEGATIVE    Comment: (NOTE) The Xpert Xpress SARS-CoV-2/FLU/RSV assay is intended as an aid in  the diagnosis of influenza from Nasopharyngeal swab specimens and  should not be used as a sole basis for treatment. Nasal washings and  aspirates are unacceptable for Xpert Xpress SARS-CoV-2/FLU/RSV  testing.  Fact Sheet for Patients: https://www.moore.com/  Fact Sheet for Healthcare Providers: https://www.young.biz/  This test is not yet approved or cleared by the [place] FDA and  has been authorized for detection and/or diagnosis of SARS-CoV-2 by  FDA under an Emergency Use Authorization (EUA). This EUA will remain  in effect (meaning this test can be used) for the duration of the  Covid-19 declaration under Section 564(b)(1) of the Act, 21  U.S.C. section 360bbb-3(b)(1), unless the authorization is  terminated or revoked. Performed at [facility], [address] [place], [place] [address]   Brain natriuretic peptide     Status: Abnormal   Collection Time: 09/22/20  3:48 AM  Result Value Ref Range   B Natriuretic Peptide 939.0 (H) 0.0 - 100.0 pg/mL    Comment: Performed at [facility], [address] [place], [place] [address]  Troponin I (High Sensitivity)     Status: Abnormal   Collection Time: 09/22/20  6:20 AM  Result Value Ref Range   Troponin I (High Sensitivity) 33 (H) <18 ng/L    Comment: (NOTE) Elevated high sensitivity troponin I (hsTnI) values and significant  changes across serial measurements may suggest ACS but many other  chronic and acute conditions are known to elevate hsTnI results.  Refer to the "Links" section for chest pain algorithms and additional  guidance. Performed at [facility], [address], [place], [place] [address]   Heparin level (unfractionated)     Status: Abnormal   Collection Time: 09/22/20   9:44 AM  Result Value Ref Range   Heparin Unfractionated <0.10 (L) 0.30 - 0.70 IU/mL    Comment: (NOTE) If heparin results are below expected values, and patient dosage has  been confirmed, suggest follow up testing of antithrombin III levels. Performed at [facility] [address] [place], [place] [address]  Magnesium     Status: None   Collection Time: 09/22/20  9:44 AM  Result Value Ref Range   Magnesium 2.1 1.7 - 2.4 mg/dL    Comment: Performed at [facility], [address], [place], [place] [address]  Sedimentation rate     Status: Abnormal   Collection Time: 09/22/20  9:44 AM  Result Value Ref Range   Sed Rate 20 (H) 0 - 16 mm/hr    Comment: Performed at [facility], [address], [place], [place] [address]  C-reactive protein     Status: Abnormal   Collection Time: 09/22/20  9:44 AM  Result Value Ref Range   CRP 1.2 (H) <1.0 mg/dL    Comment: Performed at [facility], [address], [place], [place] [address]  Troponin I (High Sensitivity)     Status: Abnormal   Collection Time: 09/22/20  9:44 AM  Result Value Ref Range   Troponin I (High Sensitivity) 33 (H) <18 ng/L    Comment: (NOTE) Elevated high sensitivity troponin I (hsTnI) values and significant  changes across serial measurements may suggest ACS but many other  chronic and acute conditions are known to elevate hsTnI results.  Refer to the "Links" section for chest pain algorithms and additional  guidance. Performed at [facility], [address], [place], [place] [address]   Troponin I (High Sensitivity)     Status: Abnormal   Collection Time: 09/22/20 12:42 PM  Result Value Ref Range   Troponin I (High Sensitivity) 28 (H) <18 ng/L    Comment: (NOTE) Elevated high sensitivity troponin I (hsTnI) values and significant  changes across serial measurements may suggest ACS but many other  chronic and acute conditions are known to elevate hsTnI results.  Refer to the "Links" section for chest pain  algorithms and additional  guidance. Performed at [facility], [address], [place], [place] [address]   CBG monitoring, ED     Status: Abnormal   Collection Time: 09/22/20 12:58 PM  Result Value Ref Range   Glucose-Capillary 346 (H) 70 - 99 mg/dL    Comment: Glucose reference range applies only to samples taken after fasting for at least 8 hours.   DG Chest Port 1 View  Result Date: 09/22/2020 CLINICAL DATA:  Chest pain. EXAM: PORTABLE CHEST 1 VIEW COMPARISON:  One-view chest x-ray 08/27/2020 FINDINGS: Heart is enlarged. Median sternotomy noted. A diffuse interstitial pattern is present. Minimal atelectasis present at bases. No other significant airspace consolidation is present. Surgical clips are present at the right axilla. IMPRESSION: 1. Cardiomegaly with mild interstitial edema, consistent with congestive heart failure. 2. Minimal bibasilar atelectasis. Electronically Signed   By: Marin Roberts M.D.   On: 09/22/2020 04:23   ECHOCARDIOGRAM COMPLETE  Result Date: 09/21/2020    ECHOCARDIOGRAM REPORT   Patient Name:   RICKARD KENNERLY Date of Exam: 09/21/2020 Medical Rec #:  [id number]        Height:       70.0 in Accession #:    [id number]       Weight:       173.2 lb Date of Birth:  06/14/1961        BSA:          1.964 m Patient Age:    59 years         BP:           120/68 mmHg Patient Gender: M  HR:           101 bpm. Exam Location:  [place] Procedure: 2D Echo, Cardiac Doppler, Color Doppler and Strain Analysis Indications:    I25.110 Atherosclerotic heart disease of native coronary artery                 with unstable angina pectoris; I25.5 Ischemic cardiomyopathy;                 I50.22 Chronic systolic (congestive) heart failure; R06.02 SOB;                 R06.9 DOE  History:        Patient has prior history of Echocardiogram examinations, most                 recent 12/05/2019. CHF and Cardiomyopathy, CAD, Prior CABG, OSA                 and COPD, Arrythmias:Atrial  Fibrillation,                 Signs/Symptoms:Shortness of Breath and Dyspnea; Risk                 Factors:Hypertension, Diabetes and Former Smoker. C/o episode of                 chest pain about a month ago -went to ER. Left AMA because                 "there were no beds.".  Sonographer:    Vella Kohler BS, RVT, RDCS Referring Phys: [phone] [name].  Sonographer Comments: Global longitudinal strain was attempted. IMPRESSIONS  1. Left ventricular ejection fraction, by estimation, is 20 to 25%. The left ventricle has severely decreased function. The left ventricle demonstrates global hypokinesis. The left ventricular internal cavity size was mildly dilated. Left ventricular diastolic parameters are indeterminate.  2. Right ventricular systolic function is mildly reduced. The right ventricular size is mildly enlarged.  3. Left atrial size was moderately dilated.  4. Right atrial size was moderately dilated.  5. The pericardial effusion is circumferential.  6. The mitral valve is abnormal. Moderate mitral valve regurgitation. No evidence of mitral stenosis.  7. The tricuspid valve is abnormal. Tricuspid valve regurgitation is moderate.  8. The aortic valve is tricuspid. Aortic valve regurgitation is not visualized. No aortic stenosis is present.  9. The inferior vena cava is normal in size with greater than 50% respiratory variability, suggesting right atrial pressure of 3 mmHg. Comparison(s): Prior Echo showed LV EF 20-25%, severely decreased function. Grade II diastolic dysfunction. Moderately reduced RV systolic function. PASP 31.5. FINDINGS  Left Ventricle: Left ventricular ejection fraction, by estimation, is 20 to 25%. The left ventricle has severely decreased function. The left ventricle demonstrates global hypokinesis. The left ventricular internal cavity size was mildly dilated. There is no left ventricular hypertrophy. Left ventricular diastolic parameters are indeterminate. Right Ventricle: The  right ventricular size is mildly enlarged. Right vetricular wall thickness was not assessed. Right ventricular systolic function is mildly reduced. Left Atrium: Left atrial size was moderately dilated. Right Atrium: Right atrial size was moderately dilated. Pericardium: Trivial pericardial effusion is present. The pericardial effusion is circumferential. Mitral Valve: The mitral valve is abnormal. Moderate mitral valve regurgitation. No evidence of mitral valve stenosis. Tricuspid Valve: The tricuspid valve is abnormal. Tricuspid valve regurgitation is moderate . No evidence of tricuspid stenosis. Aortic Valve: The aortic valve is tricuspid. Aortic valve regurgitation  is not visualized. No aortic stenosis is present. Pulmonic Valve: The pulmonic valve was not well visualized. Pulmonic valve regurgitation is not visualized. No evidence of pulmonic stenosis. Aorta: The aortic root is normal in size and structure. Pulmonary Artery: Moderate pulmonary HTN, PASP is 50 mmHg. Venous: The inferior vena cava is normal in size with greater than 50% respiratory variability, suggesting right atrial pressure of 3 mmHg. IAS/Shunts: No atrial level shunt detected by color flow Doppler.  LEFT VENTRICLE PLAX 2D LVIDd:         5.97 cm      Diastology LVIDs:         5.28 cm      LV e' medial:    7.35 cm/s LV PW:         1.29 cm      LV E/e' medial:  14.7 LV IVS:        1.11 cm      LV e' lateral:   9.19 cm/s LVOT diam:     2.00 cm      LV E/e' lateral: 11.8 LV SV:         46 LV SV Index:   24 LVOT Area:     3.14 cm  LV Volumes (MOD) LV vol d, MOD A2C: 140.0 ml LV vol d, MOD A4C: 136.0 ml LV vol s, MOD A2C: 95.8 ml LV vol s, MOD A4C: 97.8 ml LV SV MOD A2C:     44.2 ml LV SV MOD A4C:     136.0 ml LV SV MOD BP:      40.5 ml RIGHT VENTRICLE RV S prime:     9.77 cm/s TAPSE (M-mode): 1.3 cm LEFT ATRIUM             Index LA diam:        4.40 cm 2.24 cm/m LA Vol (A2C):           36.10 ml/m LA Vol (A4C):           34.40 ml/m LA Biplane  Vol:         36.70 ml/m  AORTIC VALVE             PULMONIC VALVE LVOT Vmax:   96.00 cm/s  PV Vmax:       0.70 m/s LVOT Vmean:  65.200 cm/s PV Peak grad:  1.9 mmHg LVOT VTI:    0.148 m  AORTA Ao Root diam: 3.80 cm Ao Asc diam:  3.50 cm MITRAL VALVE                 TRICUSPID VALVE MV Area (PHT): 7.37 cm      TR Peak grad:   46.0 mmHg MV Decel Time: 103 msec      TR Vmax:        339.00 cm/s MR Peak grad:    79.9 mmHg MR Mean grad:    49.5 mmHg   SHUNTS MR Vmax:         447.00 cm/s Systemic VTI:  0.15 m MR Vmean:        326.5 cm/s  Systemic Diam: 2.00 cm MR PISA Eff ROA: 16 mm MV E velocity: 108.00 cm/s MV A velocity: 32.50 cm/s MV E/A ratio:  3.32 Dina Rich MD Electronically signed by Dina Rich MD Signature Date/Time: 09/21/2020/6:09:54 PM    Final     Pending Labs Unresulted Labs (From admission, onward)          Start  Ordered   09/23/20 0500  Heparin level (unfractionated)  Daily,   R      09/22/20 0343   09/23/20 0500  CBC  Daily,   R      09/22/20 0343   09/23/20 0500  Lipid panel  Tomorrow morning,   R        09/22/20 0820   09/23/20 0500  Digoxin level  Tomorrow morning,   R        09/22/20 0900   09/22/20 1800  Heparin level (unfractionated)  Once-Timed,   STAT        09/22/20 1239   09/22/20 0817  Hemoglobin A1c  Once,   STAT       Comments: To assess prior glycemic control    09/22/20 0820   09/22/20 0759  HIV Antibody (routine testing w rflx)  (HIV Antibody (Routine testing w reflex) panel)  Once,   STAT        09/22/20 0809          Vitals/Pain Today's Vitals   09/22/20 1100 09/22/20 1130 09/22/20 1148 09/22/20 1200  BP: 133/81 105/79  122/82  Pulse: 97 (!) 51  (!) 102  Resp: (!) 23 (!) 23    SpO2: 95% 96% 93% 94%  Weight:      Height:      PainSc:        Isolation Precautions No active isolations  Medications Medications  heparin ADULT infusion 100 units/mL (25000 units/28mL sodium chloride 0.45%) (1,300 Units/hr Intravenous Rate/Dose Change  09/22/20 1302)  acetaminophen (TYLENOL) tablet 650 mg (has no administration in time range)  ondansetron (ZOFRAN) injection 4 mg (has no administration in time range)  aspirin EC tablet 81 mg (81 mg Oral Given 09/22/20 1040)  ALPRAZolam (XANAX) tablet 0.25 mg (has no administration in time range)  pantoprazole (PROTONIX) EC tablet 40 mg (40 mg Oral Given 09/22/20 1025)  clopidogrel (PLAVIX) tablet 75 mg (75 mg Oral Given 09/22/20 1025)  carvedilol (COREG) tablet 6.25 mg (6.25 mg Oral Given 09/22/20 1025)  amiodarone (PACERONE) tablet 200 mg (200 mg Oral Given 09/22/20 1025)  digoxin (LANOXIN) tablet 0.125 mg (0.125 mg Oral Given 09/22/20 1029)  magnesium oxide (MAG-OX) tablet 400 mg (400 mg Oral Given 09/22/20 1025)  rosuvastatin (CRESTOR) tablet 40 mg (40 mg Oral Given 09/22/20 1040)  sacubitril-valsartan (ENTRESTO) 24-26 mg per tablet (1 tablet Oral Given 09/22/20 1029)  potassium chloride SA (KLOR-CON) CR tablet 20 mEq (20 mEq Oral Given 09/22/20 1024)  traZODone (DESYREL) tablet 50 mg (has no administration in time range)  multivitamin with minerals tablet 1 tablet (1 tablet Oral Given 09/22/20 1024)  insulin aspart (novoLOG) injection 0-15 Units (11 Units Subcutaneous Given 09/22/20 1305)  insulin aspart (novoLOG) injection 0-5 Units (has no administration in time range)  furosemide (LASIX) injection 40 mg (has no administration in time range)  insulin detemir (LEVEMIR) injection 12 Units (12 Units Subcutaneous Given 09/22/20 1027)  ipratropium-albuterol (DUONEB) 0.5-2.5 (3) MG/3ML nebulizer solution 3 mL (3 mLs Nebulization Given 09/22/20 1148)  budesonide (PULMICORT) nebulizer solution 0.5 mg (0.5 mg Nebulization Given 09/22/20 0914)  methylPREDNISolone sodium succinate (SOLU-MEDROL) 125 mg/2 mL injection 60 mg (60 mg Intravenous Given 09/22/20 1025)  nitroGLYCERIN (NITROGLYN) 2 % ointment 1 inch (1 inch Topical Given 09/22/20 0343)  morphine 4 MG/ML injection 4 mg (4 mg Intravenous  Given 09/22/20 0340)  heparin bolus via infusion 4,000 Units (4,000 Units Intravenous Bolus from Bag 09/22/20 0354)  morphine 4 MG/ML injection 4 mg (4  mg Intravenous Given 09/22/20 0502)  alum & mag hydroxide-simeth (MAALOX/MYLANTA) 200-200-20 MG/5ML suspension 30 mL (30 mLs Oral Given 09/22/20 1023)    And  lidocaine (XYLOCAINE) 2 % viscous mouth solution 15 mL (15 mLs Oral Given 09/22/20 1023)  furosemide (LASIX) injection 80 mg (80 mg Intravenous Given 09/22/20 1024)  heparin bolus via infusion 2,300 Units (2,300 Units Intravenous Bolus from Bag 09/22/20 1302)    Mobility walks Low fall risk   Focused Assessments    R Recommendations: See Admitting Provider Note  Report given to:   Additional Notes:

## 2020-09-23 ENCOUNTER — Encounter (HOSPITAL_COMMUNITY): Payer: Self-pay | Admitting: Family Medicine

## 2020-09-23 DIAGNOSIS — Z8261 Family history of arthritis: Secondary | ICD-10-CM | POA: Diagnosis not present

## 2020-09-23 DIAGNOSIS — E1165 Type 2 diabetes mellitus with hyperglycemia: Secondary | ICD-10-CM | POA: Diagnosis present

## 2020-09-23 DIAGNOSIS — Z951 Presence of aortocoronary bypass graft: Secondary | ICD-10-CM | POA: Diagnosis not present

## 2020-09-23 DIAGNOSIS — J441 Chronic obstructive pulmonary disease with (acute) exacerbation: Secondary | ICD-10-CM | POA: Diagnosis not present

## 2020-09-23 DIAGNOSIS — G4733 Obstructive sleep apnea (adult) (pediatric): Secondary | ICD-10-CM | POA: Diagnosis present

## 2020-09-23 DIAGNOSIS — E871 Hypo-osmolality and hyponatremia: Secondary | ICD-10-CM | POA: Diagnosis not present

## 2020-09-23 DIAGNOSIS — I5023 Acute on chronic systolic (congestive) heart failure: Secondary | ICD-10-CM | POA: Diagnosis present

## 2020-09-23 DIAGNOSIS — Z7984 Long term (current) use of oral hypoglycemic drugs: Secondary | ICD-10-CM | POA: Diagnosis not present

## 2020-09-23 DIAGNOSIS — Z20822 Contact with and (suspected) exposure to covid-19: Secondary | ICD-10-CM | POA: Diagnosis present

## 2020-09-23 DIAGNOSIS — K219 Gastro-esophageal reflux disease without esophagitis: Secondary | ICD-10-CM | POA: Diagnosis present

## 2020-09-23 DIAGNOSIS — R079 Chest pain, unspecified: Secondary | ICD-10-CM | POA: Diagnosis not present

## 2020-09-23 DIAGNOSIS — I480 Paroxysmal atrial fibrillation: Secondary | ICD-10-CM | POA: Diagnosis present

## 2020-09-23 DIAGNOSIS — Z8249 Family history of ischemic heart disease and other diseases of the circulatory system: Secondary | ICD-10-CM | POA: Diagnosis not present

## 2020-09-23 DIAGNOSIS — I509 Heart failure, unspecified: Secondary | ICD-10-CM

## 2020-09-23 DIAGNOSIS — Z7902 Long term (current) use of antithrombotics/antiplatelets: Secondary | ICD-10-CM | POA: Diagnosis not present

## 2020-09-23 DIAGNOSIS — I429 Cardiomyopathy, unspecified: Secondary | ICD-10-CM | POA: Diagnosis present

## 2020-09-23 DIAGNOSIS — E119 Type 2 diabetes mellitus without complications: Secondary | ICD-10-CM | POA: Diagnosis not present

## 2020-09-23 DIAGNOSIS — Z833 Family history of diabetes mellitus: Secondary | ICD-10-CM | POA: Diagnosis not present

## 2020-09-23 DIAGNOSIS — R0602 Shortness of breath: Secondary | ICD-10-CM | POA: Diagnosis present

## 2020-09-23 DIAGNOSIS — I248 Other forms of acute ischemic heart disease: Secondary | ICD-10-CM | POA: Diagnosis present

## 2020-09-23 DIAGNOSIS — Z825 Family history of asthma and other chronic lower respiratory diseases: Secondary | ICD-10-CM | POA: Diagnosis not present

## 2020-09-23 DIAGNOSIS — I110 Hypertensive heart disease with heart failure: Secondary | ICD-10-CM | POA: Diagnosis present

## 2020-09-23 DIAGNOSIS — I2510 Atherosclerotic heart disease of native coronary artery without angina pectoris: Secondary | ICD-10-CM | POA: Diagnosis present

## 2020-09-23 DIAGNOSIS — M1990 Unspecified osteoarthritis, unspecified site: Secondary | ICD-10-CM | POA: Diagnosis present

## 2020-09-23 DIAGNOSIS — Z87891 Personal history of nicotine dependence: Secondary | ICD-10-CM | POA: Diagnosis not present

## 2020-09-23 DIAGNOSIS — Z79899 Other long term (current) drug therapy: Secondary | ICD-10-CM | POA: Diagnosis not present

## 2020-09-23 DIAGNOSIS — Z9114 Patient's other noncompliance with medication regimen: Secondary | ICD-10-CM | POA: Diagnosis not present

## 2020-09-23 DIAGNOSIS — E785 Hyperlipidemia, unspecified: Secondary | ICD-10-CM | POA: Diagnosis present

## 2020-09-23 LAB — DIGOXIN LEVEL: Digoxin Level: 0.6 ng/mL — ABNORMAL LOW (ref 1.0–2.0)

## 2020-09-23 LAB — CBC
HCT: 42.8 % (ref 39.0–52.0)
Hemoglobin: 14.1 g/dL (ref 13.0–17.0)
MCH: 28.5 pg (ref 26.0–34.0)
MCHC: 32.9 g/dL (ref 30.0–36.0)
MCV: 86.6 fL (ref 80.0–100.0)
Platelets: 297 10*3/uL (ref 150–400)
RBC: 4.94 MIL/uL (ref 4.22–5.81)
RDW: 15.5 % (ref 11.5–15.5)
WBC: 14.5 10*3/uL — ABNORMAL HIGH (ref 4.0–10.5)
nRBC: 0 % (ref 0.0–0.2)

## 2020-09-23 LAB — LIPID PANEL
Cholesterol: 200 mg/dL (ref 0–200)
HDL: 53 mg/dL (ref >40)
LDL Cholesterol: 124 mg/dL — ABNORMAL HIGH (ref 0–99)
Total CHOL/HDL Ratio: 3.8 RATIO
Triglycerides: 116 mg/dL (ref <150)
VLDL: 23 mg/dL (ref 0–40)

## 2020-09-23 LAB — GLUCOSE, CAPILLARY
Glucose-Capillary: 325 mg/dL — ABNORMAL HIGH (ref 70–99)
Glucose-Capillary: 317 mg/dL — ABNORMAL HIGH (ref 70–99)
Glucose-Capillary: 232 mg/dL — ABNORMAL HIGH (ref 70–99)
Glucose-Capillary: 307 mg/dL — ABNORMAL HIGH (ref 70–99)

## 2020-09-23 LAB — HEMOGLOBIN A1C
Hgb A1c MFr Bld: 9.4 % — ABNORMAL HIGH (ref 4.8–5.6)
Mean Plasma Glucose: 223 mg/dL

## 2020-09-23 MED ORDER — METHYLPREDNISOLONE SODIUM SUCC 40 MG IJ SOLR
40.0000 mg | Freq: Two times a day (BID) | INTRAMUSCULAR | Status: DC
  Administered 2020-09-23 – 2020-09-24 (×2): 40 mg via INTRAVENOUS
  Filled 2020-09-23 (×2): qty 1

## 2020-09-23 NOTE — Progress Notes (Signed)
PROGRESS NOTE    Kevin Underwood  ZOX:[id number] DOB: 1961-08-09 DOA: 09/22/2020 PCP: Kirstie Peri, MD   Chief complaint: Shortness of breath and chest pain  Brief Narrative:  Kevin Underwood is a 59 y.o. male with medical history significant of paroxysmal atrial fibrillation, combined systolic HF, CAD (status post CABG), HTN, HLD, type 2 diabetes, COPD and medication non-compliance; who presented to ED with complaints of SOB (at rest and exertion), orthopnea, non productive cough and chest pain. Patient reported having pain on and off for the last 2 weeks, mainly on exertion, associated with SOB, 7-8/10 in intensity, localized in the middle of his chest and described as pressure type. Pain is non radiated and he reported it ease off with the use of aspirin and NTG.   Patient denies fever, chills, nausea, vomiting, dysuria, hematuria, melena or hematochezia.   ED Course: abnormal EKG, mildly elevated troponin, vascular congestion on CXR and elevated BNP appreciated. Patient was also wheezing tremendously and having difficulty speaking in full sentences. Initially started on heparin drip and NTG paste by EDP; then after seen by cardiology service heparin was discontinue by rec's and patient treated for CHF exacerbation with demand ischemia and COPD.  Assessment & Plan: 1-acute on chronic systolic heart failure -Continue telemetry monitoring -Follow daily weights, low-sodium diet and strict I's and O's -Continue to closely follow electrolytes and renal function while actively diuresing patient Still with fine crackles on auscultation, positive orthopnea and feeling short of breath with activity -Good urine output reported -Continue IV Lasix -Continue the use of beta-blocker, digoxin and Entresto -Follow cardiology service recommendation.  2-COPD exacerbation -Also contributing to patient acute shortness of breath sensation -Improvement in his air movement and wheezing currently -Will  start steroids tapering, continue DuoNeb nebulizer management and Pulmicort. -Continue the use of flutter valve  3-paroxysmal atrial fibrillation -Sinus rhythm currently -Continue beta-blocker and digoxin -Per cardiology recommendations amiodarone has been discontinued. -Monitor on telemetry  4-hyperlipidemia -Continue statins  5-chest pain/elevated troponin/demand ischemia -Appears to be secondary to acute on chronic CHF -Continue treatment with aspirin, Plavix, beta-blocker and statins -Will follow cardiology service recommendations -at this time doesn't appear to require further ischemic work-up  6-GERD -continue PPI  7-type 2 diabetes mellitus with hyperglycemia -A1c 9.4 -Continue sliding scale insulin and Levemir -Follow CBGs and adjust hypoglycemic regimen as needed  8-essential hypertension -Overall stable and well-controlled -Continue current antihypertensive regimen and low-sodium diet -Will follow vital signs.   DVT prophylaxis: Heparin Code Status: Full code Family Communication: No family at bedside. Disposition:   Status is: Inpatient  Dispo: The patient is from: home              Anticipated d/c is to: home              Anticipated d/c date is: 2 days              Patient currently is not medically stable for discharge currently; still having shortness of breath on exertion and having orthopnea; on physical exam patient with fine crackles bibasilarly.  Continue IV diuresis, daily weights, low-sodium diet, strict I's and O's, close monitoring electrolytes and renal function while diuresing in and continue treatment for COPD using the steroids and nebulizer management.    Consultants:   Cardiology service   Procedures: See below for x-ray reports   Antimicrobials:  None   Subjective: Afebrile, no nausea, no vomiting, in no acute distress.  Reports significant improvement in his chest discomfort and also  improvement in his breathing.  Still short  of breath with activity and having orthopnea (but much better in comparison to admission).  Objective: Vitals:   09/23/20 0700 09/23/20 0740 09/23/20 0930 09/23/20 1250  BP:   106/75   Pulse:   92   Resp:   18   Temp:   98.2 F (36.8 C)   TempSrc:   Oral   SpO2:  94% 98% 96%  Weight: 82.1 kg     Height:        Intake/Output Summary (Last 24 hours) at 09/23/2020 1304 Last data filed at 09/23/2020 0900 Gross per 24 hour  Intake 909.44 ml  Output --  Net 909.44 ml   Filed Weights   09/22/20 0314 09/23/20 0500 09/23/20 0700  Weight: 79.4 kg 82.4 kg 82.1 kg    Examination: General exam: Alert, awake, oriented x 3; reports significant improvement in his chest pressure discomfort, is also breathing easier unable to speak in full sentences at this time.  Still having some orthopnea and feeling short of breath with activity (but much better).  Patient is afebrile.  No nausea, no vomiting. Respiratory system: Very little expiratory wheezing appreciated.  Positive fine crackles at the bases still appreciated.  No using accessory muscle. Cardiovascular system: Rate controlled, no rubs, no gallops, positive systolic ejection murmur.  Positive JVD.  No pedal edema Gastrointestinal system: Abdomen is nondistended, soft and nontender. No organomegaly or masses felt. Normal bowel sounds heard. Central nervous system: Alert and oriented. No focal neurological deficits. Extremities: No cyanosis or clubbing. Skin: No petechiae, rashes or open wounds. Psychiatry: Judgement and insight appear normal. Mood & affect appropriate.    Data Reviewed: I have personally reviewed following labs and imaging studies  CBC: Recent Labs  Lab 09/22/20 0348 09/23/20 0859  WBC 8.2 14.5*  NEUTROABS 5.1  --   HGB 12.3* 14.1  HCT 36.4* 42.8  MCV 85.6 86.6  PLT 226 297    Basic Metabolic Panel: Recent Labs  Lab 09/22/20 0348 09/22/20 0944  NA 134*  --   K 3.5  --   CL 100  --   CO2 24  --    GLUCOSE 296*  --   BUN 22*  --   CREATININE 0.73  --   CALCIUM 8.7*  --   MG  --  2.1    GFR: Estimated Creatinine Clearance: 102.7 mL/min (by C-G formula based on SCr of 0.73 mg/dL).  CBG: Recent Labs  Lab 09/22/20 1258 09/22/20 1842 09/22/20 2244 09/23/20 0718 09/23/20 1128  GLUCAP 346* 302* 256* 325* 317*     Recent Results (from the past 240 hour(s))  Respiratory Panel by RT PCR (Flu A&B, Covid) - Nasopharyngeal Swab     Status: None   Collection Time: 09/22/20  3:48 AM   Specimen: Nasopharyngeal Swab  Result Value Ref Range Status   SARS Coronavirus 2 by RT PCR NEGATIVE NEGATIVE Final    Comment: (NOTE) SARS-CoV-2 target nucleic acids are NOT DETECTED.  The SARS-CoV-2 RNA is generally detectable in upper respiratoy specimens during the acute phase of infection. The lowest concentration of SARS-CoV-2 viral copies this assay can detect is 131 copies/mL. A negative result does not preclude SARS-Cov-2 infection and should not be used as the sole basis for treatment or other patient management decisions. A negative result may occur with  improper specimen collection/handling, submission of specimen other than nasopharyngeal swab, presence of viral mutation(s) within the areas targeted by this assay, and inadequate  number of viral copies (<131 copies/mL). A negative result must be combined with clinical observations, patient history, and epidemiological information. The expected result is Negative.  Fact Sheet for Patients:  https://www.moore.com/  Fact Sheet for Healthcare Providers:  https://www.young.biz/  This test is no t yet approved or cleared by the [place] FDA and  has been authorized for detection and/or diagnosis of SARS-CoV-2 by FDA under an Emergency Use Authorization (EUA). This EUA will remain  in effect (meaning this test can be used) for the duration of the COVID-19 declaration under Section 564(b)(1)  of the Act, 21 U.S.C. section 360bbb-3(b)(1), unless the authorization is terminated or revoked sooner.     Influenza A by PCR NEGATIVE NEGATIVE Final   Influenza B by PCR NEGATIVE NEGATIVE Final    Comment: (NOTE) The Xpert Xpress SARS-CoV-2/FLU/RSV assay is intended as an aid in  the diagnosis of influenza from Nasopharyngeal swab specimens and  should not be used as a sole basis for treatment. Nasal washings and  aspirates are unacceptable for Xpert Xpress SARS-CoV-2/FLU/RSV  testing.  Fact Sheet for Patients: https://www.moore.com/  Fact Sheet for Healthcare Providers: https://www.young.biz/  This test is not yet approved or cleared by the [place] FDA and  has been authorized for detection and/or diagnosis of SARS-CoV-2 by  FDA under an Emergency Use Authorization (EUA). This EUA will remain  in effect (meaning this test can be used) for the duration of the  Covid-19 declaration under Section 564(b)(1) of the Act, 21  U.S.C. section 360bbb-3(b)(1), unless the authorization is  terminated or revoked. Performed at [facility], [address] [place], [place] [address]      Radiology Studies: [facility] Chest [facility] 1 View  Result Date: 09/22/2020 CLINICAL DATA:  Chest pain. EXAM: PORTABLE CHEST 1 VIEW COMPARISON:  One-view chest x-ray 08/27/2020 FINDINGS: Heart is enlarged. Median sternotomy noted. A diffuse interstitial pattern is present. Minimal atelectasis present at bases. No other significant airspace consolidation is present. Surgical clips are present at the right axilla. IMPRESSION: 1. Cardiomegaly with mild interstitial edema, consistent with congestive heart failure. 2. Minimal bibasilar atelectasis. Electronically Signed   By: Marin Roberts M.D.   On: 09/22/2020 04:23    Scheduled Meds: . aspirin EC  81 mg Oral Daily  . budesonide (PULMICORT) nebulizer solution  0.5 mg Nebulization BID  . carvedilol  6.25 mg Oral BID WC    . clopidogrel  75 mg Oral Daily  . digoxin  0.125 mg Oral Daily  . furosemide  40 mg Intravenous BID  . heparin injection (subcutaneous)  5,000 Units Subcutaneous Q8H  . insulin aspart  0-15 Units Subcutaneous TID WC  . insulin aspart  0-5 Units Subcutaneous QHS  . insulin detemir  12 Units Subcutaneous BID  . ipratropium-albuterol  3 mL Nebulization QID  . magnesium oxide  400 mg Oral BID  . methylPREDNISolone (SOLU-MEDROL) injection  60 mg Intravenous Q12H  . multivitamin with minerals  1 tablet Oral Daily  . pantoprazole  40 mg Oral Daily  . potassium chloride SA  20 mEq Oral Daily  . rosuvastatin  40 mg Oral Daily  . sacubitril-valsartan  1 tablet Oral BID   Continuous Infusions:   LOS: 0 days    Time spent: 35 minutes   Vassie Loll, MD [facility]   To contact the attending provider between 7A-7P or the covering provider during after hours 7P-7A, please log into the web site [email or web address] and access using universal [place] password for that  web site. If you do not have the password, please call the hospital operator.  09/23/2020, 1:04 PM

## 2020-09-24 DIAGNOSIS — I5023 Acute on chronic systolic (congestive) heart failure: Secondary | ICD-10-CM | POA: Diagnosis not present

## 2020-09-24 DIAGNOSIS — E1165 Type 2 diabetes mellitus with hyperglycemia: Secondary | ICD-10-CM | POA: Diagnosis not present

## 2020-09-24 DIAGNOSIS — J441 Chronic obstructive pulmonary disease with (acute) exacerbation: Secondary | ICD-10-CM | POA: Diagnosis not present

## 2020-09-24 DIAGNOSIS — R079 Chest pain, unspecified: Secondary | ICD-10-CM | POA: Diagnosis not present

## 2020-09-24 LAB — CBC
HCT: 39.6 % (ref 39.0–52.0)
Hemoglobin: 12.9 g/dL — ABNORMAL LOW (ref 13.0–17.0)
MCH: 27.7 pg (ref 26.0–34.0)
MCHC: 32.6 g/dL (ref 30.0–36.0)
MCV: 85.2 fL (ref 80.0–100.0)
Platelets: 308 10*3/uL (ref 150–400)
RBC: 4.65 MIL/uL (ref 4.22–5.81)
RDW: 15.7 % — ABNORMAL HIGH (ref 11.5–15.5)
WBC: 14 10*3/uL — ABNORMAL HIGH (ref 4.0–10.5)
nRBC: 0 % (ref 0.0–0.2)

## 2020-09-24 LAB — BASIC METABOLIC PANEL
Anion gap: 10 (ref 5–15)
BUN: 37 mg/dL — ABNORMAL HIGH (ref 6–20)
CO2: 25 mmol/L (ref 22–32)
Calcium: 9 mg/dL (ref 8.9–10.3)
Chloride: 98 mmol/L (ref 98–111)
Creatinine, Ser: 0.74 mg/dL (ref 0.61–1.24)
GFR, Estimated: >60 mL/min (ref >60)
Glucose, Bld: 268 mg/dL — ABNORMAL HIGH (ref 70–99)
Potassium: 3.9 mmol/L (ref 3.5–5.1)
Sodium: 133 mmol/L — ABNORMAL LOW (ref 135–145)

## 2020-09-24 LAB — GLUCOSE, CAPILLARY
Glucose-Capillary: 312 mg/dL — ABNORMAL HIGH (ref 70–99)
Glucose-Capillary: 421 mg/dL — ABNORMAL HIGH (ref 70–99)
Glucose-Capillary: 149 mg/dL — ABNORMAL HIGH (ref 70–99)
Glucose-Capillary: 233 mg/dL — ABNORMAL HIGH (ref 70–99)

## 2020-09-24 MED ORDER — INSULIN ASPART 100 UNIT/ML ~~LOC~~ SOLN
20.0000 [IU] | Freq: Once | SUBCUTANEOUS | Status: AC
  Administered 2020-09-24: 20 [IU] via SUBCUTANEOUS

## 2020-09-24 MED ORDER — INSULIN DETEMIR 100 UNIT/ML ~~LOC~~ SOLN
15.0000 [IU] | Freq: Two times a day (BID) | SUBCUTANEOUS | Status: DC
  Administered 2020-09-24 – 2020-09-25 (×2): 15 [IU] via SUBCUTANEOUS
  Filled 2020-09-24 (×4): qty 0.15

## 2020-09-24 MED ORDER — PREDNISONE 20 MG PO TABS
40.0000 mg | ORAL_TABLET | Freq: Every day | ORAL | Status: DC
  Administered 2020-09-25: 40 mg via ORAL
  Filled 2020-09-24: qty 2

## 2020-09-24 NOTE — Progress Notes (Signed)
PROGRESS NOTE    Kevin Underwood  SMO:[id number] DOB: [birth year] DOA: 09/22/2020 PCP: Kirstie Peri, MD   Chief complaint: Shortness of breath and chest pain  Brief Narrative:  Kevin Underwood is a 59 y.o. male with medical history significant of paroxysmal atrial fibrillation, combined systolic HF, CAD (status post CABG), HTN, HLD, type 2 diabetes, COPD and medication non-compliance; who presented to ED with complaints of SOB (at rest and exertion), orthopnea, non productive cough and chest pain. Patient reported having pain on and off for the last 2 weeks, mainly on exertion, associated with SOB, 7-8/10 in intensity, localized in the middle of his chest and described as pressure type. Pain is non radiated and he reported it ease off with the use of aspirin and NTG.   Patient denies fever, chills, nausea, vomiting, dysuria, hematuria, melena or hematochezia.   ED Course: abnormal EKG, mildly elevated troponin, vascular congestion on CXR and elevated BNP appreciated. Patient was also wheezing tremendously and having difficulty speaking in full sentences. Initially started on heparin drip and NTG paste by EDP; then after seen by cardiology service heparin was discontinue by rec's and patient treated for CHF exacerbation with demand ischemia and COPD.  Assessment & Plan: 1-acute on chronic systolic heart failure -Continue telemetry monitoring -Follow daily weights, low-sodium diet and strict I's and O's -Continue to closely follow electrolytes and renal function while actively diuresing patient Still with fine crackles on auscultation, positive orthopnea and feeling short of breath with activity -Good urine output reported -Continue IV Lasix for another 24 hours -Continue the use of beta-blocker, digoxin and Entresto -Follow cardiology service recommendation and final instructions on his medication regimen prior to discharge (most likely home 09/24/20).  2-COPD exacerbation -Also  contributing to patient acute shortness of breath sensation -Improvement in his air movement and wheezing currently -Will continue steroids tapering and transition to oral route, continue DuoNeb nebulizer management and Pulmicort. -Continue the use of flutter valve  3-paroxysmal atrial fibrillation -Sinus rhythm currently -Continue beta-blocker and digoxin -Per cardiology recommendations amiodarone has been discontinued. -Monitor on telemetry  4-hyperlipidemia -Continue statins  5-chest pain/elevated troponin/demand ischemia -Appears to be secondary to acute on chronic CHF -Continue treatment with aspirin, Plavix, beta-blocker and statins -Will follow cardiology service recommendations -at this time doesn't appear to require ro need further ischemic work-up  6-GERD -continue PPI  7-type 2 diabetes mellitus with hyperglycemia -A1c 9.4 -Continue sliding scale insulin and Levemir (last one adjusted to 15 units BID) -Follow CBGs and further adjust hypoglycemic regimen as needed  8-essential hypertension -Overall stable and well-controlled -Continue current antihypertensive regimen and low-sodium diet -Will continue to follow vital signs.   DVT prophylaxis: Heparin Code Status: Full code Family Communication: No family at bedside. Disposition:   Status is: Inpatient  Dispo: The patient is from: home              Anticipated d/c is to: home              Anticipated d/c date is: 1 day              Patient currently is not medically stable for discharge currently; still having shortness of breath on exertion and having mild orthopnea; on physical exam patient with fine crackles, but not using accessory muscles or experiencing wheezing currently. Continue IV diuresis for another 24 hours, continue to follow daily weights, low-sodium diet, strict I's and O's, close monitoring electrolytes and renal function while diuresing him. Will also start tapering  steroids and continue neb  management for his COPD exacerbation.     Consultants:   Cardiology service   Procedures: See below for x-ray reports   Antimicrobials:  None   Subjective: No fever, no nausea, no vomiting, no chest pain.  Reports breathing significantly improved.  Still having some shortness of breath and mild orthopnea.  No requiring oxygen supplementation.  Reports good urine output  Objective: Vitals:   09/23/20 1700 09/23/20 2006 09/23/20 2048 09/24/20 0415  BP:   116/78 109/73  Pulse:  71 96 87  Resp:  20 20 16   Temp:   97.9 F (36.6 C)   TempSrc:   Oral   SpO2: 96% 98% 99% 99%  Weight:      Height:        Intake/Output Summary (Last 24 hours) at 09/24/2020 1052 Last data filed at 09/24/2020 0634 Gross per 24 hour  Intake 720 ml  Output --  Net 720 ml   Filed Weights   09/22/20 0314 09/23/20 0500 09/23/20 0700  Weight: 79.4 kg 82.4 kg 82.1 kg    Examination: General exam: Alert, awake, oriented x 3; reports no chest pain, no nausea, no vomiting.  Mild orthopnea still some shortness of breath (significantly improved as per his reports). Respiratory system: Currently no wheezing, no using accessory muscles; fine crackles appreciated at the bases. Cardiovascular system: No JVD on exam; regular rate, no rubs or gallops; positive systolic ejection murmur. Gastrointestinal system: Abdomen is nondistended, soft and nontender. No organomegaly or masses felt. Normal bowel sounds heard. Central nervous system: Alert and oriented. No focal neurological deficits. Extremities: No cyanosis or clubbing; no edema. Skin: No rashes, no petechiae. Psychiatry: Judgement and insight appear normal. Mood & affect appropriate.     Data Reviewed: I have personally reviewed following labs and imaging studies  CBC: Recent Labs  Lab 09/22/20 0348 09/23/20 0859 09/24/20 0522  WBC 8.2 14.5* 14.0*  NEUTROABS 5.1  --   --   HGB 12.3* 14.1 12.9*  HCT 36.4* 42.8 39.6  MCV 85.6 86.6 85.2  PLT  226 297 308    Basic Metabolic Panel: Recent Labs  Lab 09/22/20 0348 09/22/20 0944 09/24/20 0522  NA 134*  --  133*  K 3.5  --  3.9  CL 100  --  98  CO2 24  --  25  GLUCOSE 296*  --  268*  BUN 22*  --  37*  CREATININE 0.73  --  0.74  CALCIUM 8.7*  --  9.0  MG  --  2.1  --     GFR: Estimated Creatinine Clearance: 102.7 mL/min (by C-G formula based on SCr of 0.74 mg/dL).  CBG: Recent Labs  Lab 09/23/20 0718 09/23/20 1128 09/23/20 1625 09/23/20 2104 09/24/20 0748  GLUCAP 325* 317* 232* 307* 312*     Recent Results (from the past 240 hour(s))  Respiratory Panel by RT PCR (Flu A&B, Covid) - Nasopharyngeal Swab     Status: None   Collection Time: 09/22/20  3:48 AM   Specimen: Nasopharyngeal Swab  Result Value Ref Range Status   SARS Coronavirus 2 by RT PCR NEGATIVE NEGATIVE Final    Comment: (NOTE) SARS-CoV-2 target nucleic acids are NOT DETECTED.  The SARS-CoV-2 RNA is generally detectable in upper respiratoy specimens during the acute phase of infection. The lowest concentration of SARS-CoV-2 viral copies this assay can detect is 131 copies/mL. A negative result does not preclude SARS-Cov-2 infection and should not be used as the sole  basis for treatment or other patient management decisions. A negative result may occur with  improper specimen collection/handling, submission of specimen other than nasopharyngeal swab, presence of viral mutation(s) within the areas targeted by this assay, and inadequate number of viral copies (<131 copies/mL). A negative result must be combined with clinical observations, patient history, and epidemiological information. The expected result is Negative.  Fact Sheet for Patients:  https://www.moore.com/  Fact Sheet for Healthcare Providers:  https://www.young.biz/  This test is no t yet approved or cleared by the [place] FDA and  has been authorized for detection and/or diagnosis of  SARS-CoV-2 by FDA under an Emergency Use Authorization (EUA). This EUA will remain  in effect (meaning this test can be used) for the duration of the COVID-19 declaration under Section 564(b)(1) of the Act, 21 U.S.C. section 360bbb-3(b)(1), unless the authorization is terminated or revoked sooner.     Influenza A by PCR NEGATIVE NEGATIVE Final   Influenza B by PCR NEGATIVE NEGATIVE Final    Comment: (NOTE) The Xpert Xpress SARS-CoV-2/FLU/RSV assay is intended as an aid in  the diagnosis of influenza from Nasopharyngeal swab specimens and  should not be used as a sole basis for treatment. Nasal washings and  aspirates are unacceptable for Xpert Xpress SARS-CoV-2/FLU/RSV  testing.  Fact Sheet for Patients: https://www.moore.com/  Fact Sheet for Healthcare Providers: https://www.young.biz/  This test is not yet approved or cleared by the [place] FDA and  has been authorized for detection and/or diagnosis of SARS-CoV-2 by  FDA under an Emergency Use Authorization (EUA). This EUA will remain  in effect (meaning this test can be used) for the duration of the  Covid-19 declaration under Section 564(b)(1) of the Act, 21  U.S.C. section 360bbb-3(b)(1), unless the authorization is  terminated or revoked. Performed at [facility], [address], [place], [place] [address]      Radiology Studies: No results found.  Scheduled Meds: . aspirin EC  81 mg Oral Daily  . budesonide (PULMICORT) nebulizer solution  0.5 mg Nebulization BID  . carvedilol  6.25 mg Oral BID WC  . clopidogrel  75 mg Oral Daily  . digoxin  0.125 mg Oral Daily  . furosemide  40 mg Intravenous BID  . heparin injection (subcutaneous)  5,000 Units Subcutaneous Q8H  . insulin aspart  0-15 Units Subcutaneous TID WC  . insulin aspart  0-5 Units Subcutaneous QHS  . insulin detemir  12 Units Subcutaneous BID  . ipratropium-albuterol  3 mL Nebulization QID  . magnesium oxide   400 mg Oral BID  . multivitamin with minerals  1 tablet Oral Daily  . pantoprazole  40 mg Oral Daily  . potassium chloride SA  20 mEq Oral Daily  . [START ON 09/25/2020] predniSONE  40 mg Oral Q breakfast  . rosuvastatin  40 mg Oral Daily  . sacubitril-valsartan  1 tablet Oral BID   Continuous Infusions:   LOS: 1 day    Time spent: 30 minutes   Vassie Loll, MD [facility]   To contact the attending provider between 7A-7P or the covering provider during after hours 7P-7A, please log into the web site [email or web address] and access using universal [place] password for that web site. If you do not have the password, please call the hospital operator.  09/24/2020, 10:52 AM

## 2020-09-25 DIAGNOSIS — I5023 Acute on chronic systolic (congestive) heart failure: Secondary | ICD-10-CM | POA: Diagnosis not present

## 2020-09-25 DIAGNOSIS — E119 Type 2 diabetes mellitus without complications: Secondary | ICD-10-CM | POA: Diagnosis not present

## 2020-09-25 DIAGNOSIS — J441 Chronic obstructive pulmonary disease with (acute) exacerbation: Secondary | ICD-10-CM | POA: Diagnosis not present

## 2020-09-25 DIAGNOSIS — E785 Hyperlipidemia, unspecified: Secondary | ICD-10-CM

## 2020-09-25 DIAGNOSIS — E871 Hypo-osmolality and hyponatremia: Secondary | ICD-10-CM | POA: Diagnosis not present

## 2020-09-25 LAB — BASIC METABOLIC PANEL
Anion gap: 11 (ref 5–15)
BUN: 33 mg/dL — ABNORMAL HIGH (ref 6–20)
CO2: 29 mmol/L (ref 22–32)
Calcium: 8.7 mg/dL — ABNORMAL LOW (ref 8.9–10.3)
Chloride: 97 mmol/L — ABNORMAL LOW (ref 98–111)
Creatinine, Ser: 0.74 mg/dL (ref 0.61–1.24)
GFR, Estimated: >60 mL/min (ref >60)
Glucose, Bld: 138 mg/dL — ABNORMAL HIGH (ref 70–99)
Potassium: 3.2 mmol/L — ABNORMAL LOW (ref 3.5–5.1)
Sodium: 137 mmol/L (ref 135–145)

## 2020-09-25 LAB — GLUCOSE, CAPILLARY
Glucose-Capillary: 151 mg/dL — ABNORMAL HIGH (ref 70–99)
Glucose-Capillary: 169 mg/dL — ABNORMAL HIGH (ref 70–99)

## 2020-09-25 MED ORDER — METFORMIN HCL 500 MG PO TABS
1000.0000 mg | ORAL_TABLET | Freq: Two times a day (BID) | ORAL | 2 refills | Status: DC
Start: 2020-09-25 — End: 2020-12-15

## 2020-09-25 MED ORDER — PREDNISONE 20 MG PO TABS: 40.0000 mg | ORAL_TABLET | Freq: Every day | ORAL | 0 refills | Status: AC

## 2020-09-25 MED ORDER — BLOOD GLUCOSE MONITOR KIT: PACK | 0 refills | Status: DC

## 2020-09-25 MED ORDER — DAPAGLIFLOZIN PROPANEDIOL 5 MG PO TABS: 5.0000 mg | ORAL_TABLET | Freq: Every day | ORAL | 3 refills | Status: DC

## 2020-09-25 MED ORDER — CARVEDILOL 6.25 MG PO TABS
6.2500 mg | ORAL_TABLET | Freq: Two times a day (BID) | ORAL | 3 refills | Status: DC
Start: 2020-09-25 — End: 2020-09-29

## 2020-09-25 MED ORDER — ROSUVASTATIN CALCIUM 40 MG PO TABS
40.0000 mg | ORAL_TABLET | Freq: Every day | ORAL | 3 refills | Status: DC
Start: 2020-09-25 — End: 2021-09-09

## 2020-09-25 MED ORDER — PANTOPRAZOLE SODIUM 40 MG PO TBEC
40.0000 mg | DELAYED_RELEASE_TABLET | Freq: Every day | ORAL | 2 refills | Status: DC
Start: 2020-09-26 — End: 2021-08-01

## 2020-09-25 MED ORDER — GABAPENTIN 600 MG PO TABS
600.0000 mg | ORAL_TABLET | Freq: Three times a day (TID) | ORAL | 0 refills | Status: DC
Start: 2020-09-25 — End: 2021-09-09

## 2020-09-25 MED ORDER — SACUBITRIL-VALSARTAN 24-26 MG PO TABS
1.0000 | ORAL_TABLET | Freq: Two times a day (BID) | ORAL | 3 refills | Status: DC
Start: 2020-09-25 — End: 2020-09-29

## 2020-09-25 MED ORDER — FUROSEMIDE 80 MG PO TABS: 40.0000 mg | ORAL_TABLET | ORAL | 2 refills | Status: DC

## 2020-09-25 MED ORDER — DIGOXIN 125 MCG PO TABS
0.1250 mg | ORAL_TABLET | Freq: Every day | ORAL | 3 refills | Status: DC
Start: 2020-09-25 — End: 2020-09-29

## 2020-09-25 MED ORDER — MAGNESIUM OXIDE 400 (241.3 MG) MG PO TABS
400.0000 mg | ORAL_TABLET | Freq: Every day | ORAL | 2 refills | Status: DC
Start: 2020-09-25 — End: 2021-05-03

## 2020-09-25 MED ORDER — POTASSIUM CHLORIDE CRYS ER 20 MEQ PO TBCR: 40.0000 meq | EXTENDED_RELEASE_TABLET | Freq: Two times a day (BID) | ORAL | 2 refills | Status: DC

## 2020-09-25 MED ORDER — MOMETASONE FURO-FORMOTEROL FUM 200-5 MCG/ACT IN AERO: 2.0000 | INHALATION_SPRAY | Freq: Two times a day (BID) | RESPIRATORY_TRACT | 2 refills | Status: DC

## 2020-09-25 MED ORDER — POTASSIUM CHLORIDE CRYS ER 20 MEQ PO TBCR
40.0000 meq | EXTENDED_RELEASE_TABLET | Freq: Two times a day (BID) | ORAL | Status: DC
  Administered 2020-09-25: 40 meq via ORAL
  Filled 2020-09-25: qty 2

## 2020-09-25 NOTE — Discharge Summary (Signed)
Physician Discharge Summary  NIEL PERETTI YFV:[id number] DOB: 1961-06-26 DOA: 09/22/2020  PCP: Monico Blitz, MD  Admit date: 09/22/2020 Discharge date: 09/25/2020  Time spent: 35 minutes  Recommendations for Outpatient Follow-up:  1. Reassess blood pressure and adjust antihypertensive regimen as needed 2. Repeat basic metabolic panel to follow close renal function 3. Close monitoring to patient CBGs/A1c with further adjustment to hypoglycemic regimen as required 4. Outpatient follow-up with cardiology service.   Discharge Diagnoses:  Acute on chronic systolic heart failure COPD with acute exacerbation (HCC) Chest pain and elevated troponin Hyperlipidemia Hypertension Type 2 diabetes with hyperglycemia Gastroesophageal flux disease Demand ischemia  Discharge Condition: Stable and improved.  Patient discharged home with instructions to follow-up with PCP and cardiology service as an outpatient.  CODE STATUS: Full code.  Diet recommendation: Heart healthy and modified carbohydrate diet.  Filed Weights   09/23/20 0500 09/23/20 0700 09/25/20 0532  Weight: 82.4 kg 82.1 kg 81.5 kg    History of present illness:  Kevin Underwood a 59 y.o.malewith medical history significant ofparoxysmal atrial fibrillation, combined systolic HF, CAD (status post CABG), HTN, HLD, type 2 diabetes, COPD and medication non-compliance; who presented to ED with complaints of SOB (at rest and exertion), orthopnea, non productive cough and chest pain. Patient reported having pain on and off for the last 2 weeks, mainly on exertion, associated with SOB, 7-8/10 in intensity, localized in the middle of his chest and described as pressure type. Pain is non radiated and he reported it ease off with the use of aspirin and NTG.   Patient denies fever, chills, nausea, vomiting, dysuria, hematuria, melena or hematochezia.  ED Course:abnormal EKG, mildly elevated troponin, vascular congestion on CXR and  elevated BNP appreciated. Patient was also wheezing tremendously and having difficulty speaking in full sentences. Initially started on heparin drip and NTG paste by EDP; then after seen by cardiology service heparin was discontinue by rec's and patient treated for CHF exacerbation with demand ischemia and COPD.  Hospital Course:  1-acute on chronic systolic heart failure -Encouraged to follow daily weights. -Continue to closely follow electrolytes and renal function as an outpatient. -Patient instructed to maintain adequate hydration and to follow low-sodium diet (less than 2 g daily). -Continue Lasix 80 mg in the morning and 40 mg in the afternoon. -Continue the use of beta-blocker, digoxin and Entresto -Follow cardiology service recommendation and final instructions on his medication regimen prior to discharge (most likely home 09/25/20).  2-COPD exacerbation -Also contributing to patient acute shortness of breath sensation -Improvement in his air movement and wheezing appreciated prior to discharge -Will continue steroids tapering  by mouth, continue DuoNeb nebulizer management and discharge on dulera. -Continue the use of flutter valve  3-paroxysmal atrial fibrillation -Sinus rhythm currently -Continue beta-blocker and digoxin -Per cardiology recommendations amiodarone has been discontinued. -Heart rate remained stable and well-controlled.  4-hyperlipidemia -Continue statins -Heart healthy diet has been encourage.  5-chest pain/elevated troponin/demand ischemia -Appears to be secondary to acute on chronic CHF -Continue treatment with Plavix, beta-blocker and statins -Will follow up with cardiology service as an outpatient. -No ischemia work-up recommended during this hospitalization.  6-GERD -continue PPI  7-type 2 diabetes mellitus with hyperglycemia -A1c 9.4 -Follow CBGs and further adjust hypoglycemic regimen as required -Glucometer prescribed at discharge to  check his blood sugar 3 times a day; patient initiated on Farxiga daily.  Resume the rest of oral hypoglycemic agent along with modified carbohydrate diet.  8-essential hypertension -Overall stable and well-controlled -Continue current  antihypertensive regimen and low-sodium diet -Reassess blood pressure at follow-up visit and adjust antihypertensive regimen as needed.  Procedures: See below for x-ray reports.  Consultations:  Cardiology service  Discharge Exam: Vitals:   09/25/20 1258 09/25/20 1351  BP:  102/64  Pulse:  80  Resp:  16  Temp:    SpO2: 96% 97%    General: Afebrile, no chest pain, no nausea, no vomiting.  Reports breathing back to baseline.  Ready to go home. Cardiovascular: Rate controlled 1 currently sinus; no rubs, no gallops, no JVD.  Systolic ejection murmur appreciated on exam. Respiratory: Good air movement bilaterally, no requiring oxygen supplementation.  No wheezing or crackles appreciated. Abdomen: Soft, nontender, nondistended, positive bowel sounds Extremities: No edema, no cyanosis or clubbing.  Discharge Instructions   Discharge Instructions    (HEART FAILURE PATIENTS) Call MD:  Anytime you have any of the following symptoms: 1) 3 pound weight gain in 24 hours or 5 pounds in 1 week 2) shortness of breath, with or without a dry hacking cough 3) swelling in the hands, feet or stomach 4) if you have to sleep on extra pillows at night in order to breathe.   Complete by: As directed    Diet - low sodium heart healthy   Complete by: As directed    Diet Carb Modified   Complete by: As directed    Discharge instructions   Complete by: As directed    Take medications as prescribed Maintain adequate hydration Follow low-sodium diet (less than 2 g daily) Maintain adequate hydration Follow modified carbohydrate diet Follow-up with cardiology service as instructed  Follow-up with PCP in 10 days.   Increase activity slowly   Complete by: As directed       Allergies as of 09/25/2020   No Known Allergies     Medication List    STOP taking these medications   amiodarone 200 MG tablet Commonly known as: PACERONE   colchicine 0.6 MG tablet   Invokamet XR [phone] MG Tb24 Generic drug: Canagliflozin-metFORMIN HCl ER     TAKE these medications   albuterol (2.5 MG/3ML) 0.083% nebulizer solution Commonly known as: PROVENTIL Take 2.5 mg by nebulization every 6 (six) hours as needed for wheezing or shortness of breath.   blood glucose meter kit and supplies Kit Dispense based on patient and insurance preference. Use to check CBG's three times a day. (FOR ICD-9 250.00, 250.01).   carvedilol 6.25 MG tablet Commonly known as: COREG Take 1 tablet (6.25 mg total) by mouth 2 (two) times daily with a meal.   clopidogrel 75 MG tablet Commonly known as: PLAVIX Take 1 tablet (75 mg total) by mouth daily.   Combivent Respimat 20-100 MCG/ACT Aers respimat Generic drug: Ipratropium-Albuterol Inhale 1 puff into the lungs every 6 (six) hours as needed.   dapagliflozin propanediol 5 MG Tabs tablet Commonly known as: FARXIGA Take 1 tablet (5 mg total) by mouth daily before breakfast.   digoxin 0.125 MG tablet Commonly known as: LANOXIN Take 1 tablet (0.125 mg total) by mouth daily.   furosemide 80 MG tablet Commonly known as: Lasix Take 0.5-1 tablets (40-80 mg total) by mouth See admin instructions. 18m in the am 469min the evening   gabapentin 600 MG tablet Commonly known as: NEURONTIN Take 1 tablet (600 mg total) by mouth 3 (three) times daily. What changed:   when to take this  Another medication with the same name was removed. Continue taking this medication, and follow the directions  you see here.   magnesium oxide 400 (241.3 Mg) MG tablet Commonly known as: MAG-OX Take 1 tablet (400 mg total) by mouth daily. What changed: when to take this   metFORMIN 500 MG tablet Commonly known as: GLUCOPHAGE Take 2 tablets (1,000  mg total) by mouth 2 (two) times daily with a meal.   mometasone-formoterol 200-5 MCG/ACT Aero Commonly known as: DULERA Inhale 2 puffs into the lungs in the morning and at bedtime.   multivitamin with minerals Tabs tablet Take 1 tablet by mouth daily.   pantoprazole 40 MG tablet Commonly known as: PROTONIX Take 1 tablet (40 mg total) by mouth daily. Start taking on: September 26, 2020   potassium chloride SA 20 MEQ tablet Commonly known as: KLOR-CON Take 2 tablets (40 mEq total) by mouth 2 (two) times daily. What changed:   how much to take  when to take this   predniSONE 20 MG tablet Commonly known as: DELTASONE Take 2 tablets (40 mg total) by mouth daily with breakfast for 5 days. And stop prednisone after that. Start taking on: September 26, 2020   rosuvastatin 40 MG tablet Commonly known as: CRESTOR Take 1 tablet (40 mg total) by mouth daily. What changed: See the new instructions.   sacubitril-valsartan 24-26 MG Commonly known as: ENTRESTO Take 1 tablet by mouth 2 (two) times daily.   traZODone 50 MG tablet Commonly known as: DESYREL Take 50 mg by mouth at bedtime.      No Known Allergies  Follow-up Information    Monico Blitz, MD. Schedule an appointment as soon as possible for a visit in 10 day(s).   Specialty: Internal Medicine Contact information: [place] [address] [phone]        Satira Sark, MD .   Specialty: Cardiology Contact information: [place] [address] [phone]               The results of significant diagnostics from this hospitalization (including imaging, microbiology, ancillary and laboratory) are listed below for reference.    Significant Diagnostic Studies: DG Chest Port 1 View  Result Date: 09/22/2020 CLINICAL DATA:  Chest pain. EXAM: PORTABLE CHEST 1 VIEW COMPARISON:  One-view chest x-ray 08/27/2020 FINDINGS: Heart is enlarged. Median sternotomy noted. A diffuse  interstitial pattern is present. Minimal atelectasis present at bases. No other significant airspace consolidation is present. Surgical clips are present at the right axilla. IMPRESSION: 1. Cardiomegaly with mild interstitial edema, consistent with congestive heart failure. 2. Minimal bibasilar atelectasis. Electronically Signed   By: San Morelle M.D.   On: 09/22/2020 04:23   ECHOCARDIOGRAM COMPLETE  Result Date: 09/21/2020    ECHOCARDIOGRAM REPORT   Patient Name:   Kevin Underwood Date of Exam: 09/21/2020 Medical Rec #:  [id number]        Height:       70.0 in Accession #:    [id number]       Weight:       173.2 lb Date of Birth:  1961-03-22        BSA:          1.964 m Patient Age:    59 years         BP:           120/68 mmHg Patient Gender: M                HR:           101 bpm. Exam  Location:  [facility] Procedure: 2D Echo, Cardiac Doppler, Color Doppler and Strain Analysis Indications:    I25.110 Atherosclerotic heart disease of native coronary artery                 with unstable angina pectoris; I25.5 Ischemic cardiomyopathy;                 N36.14 Chronic systolic (congestive) heart failure; R06.02 SOB;                 R06.9 DOE  History:        Patient has prior history of Echocardiogram examinations, most                 recent 12/05/2019. CHF and Cardiomyopathy, CAD, Prior CABG, OSA                 and COPD, Arrythmias:Atrial Fibrillation,                 Signs/Symptoms:Shortness of Breath and Dyspnea; Risk                 Factors:Hypertension, Diabetes and Former Smoker. C/o episode of                 chest pain about a month ago -went to ER. Left AMA because                 "there were no beds.".  Sonographer:    Hester Mates BS, RVT, RDCS Referring Phys: [place].  Sonographer Comments: Global longitudinal strain was attempted. IMPRESSIONS  1. Left ventricular ejection fraction, by estimation, is 20 to 25%. The left ventricle has severely decreased function. The left  ventricle demonstrates global hypokinesis. The left ventricular internal cavity size was mildly dilated. Left ventricular diastolic parameters are indeterminate.  2. Right ventricular systolic function is mildly reduced. The right ventricular size is mildly enlarged.  3. Left atrial size was moderately dilated.  4. Right atrial size was moderately dilated.  5. The pericardial effusion is circumferential.  6. The mitral valve is abnormal. Moderate mitral valve regurgitation. No evidence of mitral stenosis.  7. The tricuspid valve is abnormal. Tricuspid valve regurgitation is moderate.  8. The aortic valve is tricuspid. Aortic valve regurgitation is not visualized. No aortic stenosis is present.  9. The inferior vena cava is normal in size with greater than 50% respiratory variability, suggesting right atrial pressure of 3 mmHg. Comparison(s): Prior Echo showed LV EF 20-25%, severely decreased function. Grade II diastolic dysfunction. Moderately reduced RV systolic function. PASP 31.5. FINDINGS  Left Ventricle: Left ventricular ejection fraction, by estimation, is 20 to 25%. The left ventricle has severely decreased function. The left ventricle demonstrates global hypokinesis. The left ventricular internal cavity size was mildly dilated. There is no left ventricular hypertrophy. Left ventricular diastolic parameters are indeterminate. Right Ventricle: The right ventricular size is mildly enlarged. Right vetricular wall thickness was not assessed. Right ventricular systolic function is mildly reduced. Left Atrium: Left atrial size was moderately dilated. Right Atrium: Right atrial size was moderately dilated. Pericardium: Trivial pericardial effusion is present. The pericardial effusion is circumferential. Mitral Valve: The mitral valve is abnormal. Moderate mitral valve regurgitation. No evidence of mitral valve stenosis. Tricuspid Valve: The tricuspid valve is abnormal. Tricuspid valve regurgitation is moderate . No  evidence of tricuspid stenosis. Aortic Valve: The aortic valve is tricuspid. Aortic valve regurgitation is not visualized. No aortic stenosis is present. Pulmonic Valve: The pulmonic valve was  not well visualized. Pulmonic valve regurgitation is not visualized. No evidence of pulmonic stenosis. Aorta: The aortic root is normal in size and structure. Pulmonary Artery: Moderate pulmonary HTN, PASP is 50 mmHg. Venous: The inferior vena cava is normal in size with greater than 50% respiratory variability, suggesting right atrial pressure of 3 mmHg. IAS/Shunts: No atrial level shunt detected by color flow Doppler.  LEFT VENTRICLE PLAX 2D LVIDd:         5.97 cm      Diastology LVIDs:         5.28 cm      LV e' medial:    7.35 cm/s LV PW:         1.29 cm      LV E/e' medial:  14.7 LV IVS:        1.11 cm      LV e' lateral:   9.19 cm/s LVOT diam:     2.00 cm      LV E/e' lateral: 11.8 LV SV:         46 LV SV Index:   24 LVOT Area:     3.14 cm  LV Volumes (MOD) LV vol d, MOD A2C: 140.0 ml LV vol d, MOD A4C: 136.0 ml LV vol s, MOD A2C: 95.8 ml LV vol s, MOD A4C: 97.8 ml LV SV MOD A2C:     44.2 ml LV SV MOD A4C:     136.0 ml LV SV MOD BP:      40.5 ml RIGHT VENTRICLE RV S prime:     9.77 cm/s TAPSE (M-mode): 1.3 cm LEFT ATRIUM             Index LA diam:        4.40 cm 2.24 cm/m LA Vol (A2C):           36.10 ml/m LA Vol (A4C):           34.40 ml/m LA Biplane Vol:         36.70 ml/m  AORTIC VALVE             PULMONIC VALVE LVOT Vmax:   96.00 cm/s  PV Vmax:       0.70 m/s LVOT Vmean:  65.200 cm/s PV Peak grad:  1.9 mmHg LVOT VTI:    0.148 m  AORTA Ao Root diam: 3.80 cm Ao Asc diam:  3.50 cm MITRAL VALVE                 TRICUSPID VALVE MV Area (PHT): 7.37 cm      TR Peak grad:   46.0 mmHg MV Decel Time: 103 msec      TR Vmax:        339.00 cm/s MR Peak grad:    79.9 mmHg MR Mean grad:    49.5 mmHg   SHUNTS MR Vmax:         447.00 cm/s Systemic VTI:  0.15 m MR Vmean:        326.5 cm/s  Systemic Diam: 2.00 cm MR PISA Eff ROA:  16 mm MV E velocity: 108.00 cm/s MV A velocity: 32.50 cm/s MV E/A ratio:  3.32 Carlyle Dolly MD Electronically signed by Carlyle Dolly MD Signature Date/Time: 09/21/2020/6:09:54 PM    Final     Microbiology: Recent Results (from the past 240 hour(s))  Respiratory Panel by RT PCR (Flu A&B, Covid) - Nasopharyngeal Swab     Status: None   Collection Time: 09/22/20  3:48 AM  Specimen: Nasopharyngeal Swab  Result Value Ref Range Status   SARS Coronavirus 2 by RT PCR NEGATIVE NEGATIVE Final    Comment: (NOTE) SARS-CoV-2 target nucleic acids are NOT DETECTED.  The SARS-CoV-2 RNA is generally detectable in upper respiratoy specimens during the acute phase of infection. The lowest concentration of SARS-CoV-2 viral copies this assay can detect is 131 copies/mL. A negative result does not preclude SARS-Cov-2 infection and should not be used as the sole basis for treatment or other patient management decisions. A negative result may occur with  improper specimen collection/handling, submission of specimen other than nasopharyngeal swab, presence of viral mutation(s) within the areas targeted by this assay, and inadequate number of viral copies (<131 copies/mL). A negative result must be combined with clinical observations, patient history, and epidemiological information. The expected result is Negative.  Fact Sheet for Patients:  PinkCheek.be  Fact Sheet for Healthcare Providers:  GravelBags.it  This test is no t yet approved or cleared by the [place] FDA and  has been authorized for detection and/or diagnosis of SARS-CoV-2 by FDA under an Emergency Use Authorization (EUA). This EUA will remain  in effect (meaning this test can be used) for the duration of the COVID-19 declaration under Section 564(b)(1) of the Act, 21 U.S.C. section 360bbb-3(b)(1), unless the authorization is terminated or revoked sooner.      Influenza A by PCR NEGATIVE NEGATIVE Final   Influenza B by PCR NEGATIVE NEGATIVE Final    Comment: (NOTE) The Xpert Xpress SARS-CoV-2/FLU/RSV assay is intended as an aid in  the diagnosis of influenza from Nasopharyngeal swab specimens and  should not be used as a sole basis for treatment. Nasal washings and  aspirates are unacceptable for Xpert Xpress SARS-CoV-2/FLU/RSV  testing.  Fact Sheet for Patients: PinkCheek.be  Fact Sheet for Healthcare Providers: GravelBags.it  This test is not yet approved or cleared by the [place] FDA and  has been authorized for detection and/or diagnosis of SARS-CoV-2 by  FDA under an Emergency Use Authorization (EUA). This EUA will remain  in effect (meaning this test can be used) for the duration of the  Covid-19 declaration under Section 564(b)(1) of the Act, 21  U.S.C. section 360bbb-3(b)(1), unless the authorization is  terminated or revoked. Performed at [facility], [address], [place], [place] [address]      Labs: Basic Metabolic Panel: Recent Labs  Lab 09/22/20 0348 09/22/20 0944 09/24/20 0522 09/25/20 0737  NA 134*  --  133* 137  K 3.5  --  3.9 3.2*  CL 100  --  98 97*  CO2 24  --  25 29  GLUCOSE 296*  --  268* 138*  BUN 22*  --  37* 33*  CREATININE 0.73  --  0.74 0.74  CALCIUM 8.7*  --  9.0 8.7*  MG  --  2.1  --   --    CBC: Recent Labs  Lab 09/22/20 0348 09/23/20 0859 09/24/20 0522  WBC 8.2 14.5* 14.0*  NEUTROABS 5.1  --   --   HGB 12.3* 14.1 12.9*  HCT 36.4* 42.8 39.6  MCV 85.6 86.6 85.2  PLT 226 297 308   BNP (last 3 results) Recent Labs    11/23/19 0453 11/25/19 0500 09/22/20 0348  BNP 1,054.0* 596.1* 939.0*   CBG: Recent Labs  Lab 09/24/20 1146 09/24/20 1709 09/24/20 2103 09/25/20 0749 09/25/20 1149  GLUCAP 421* 149* 233* 151* 169*    Signed:  Barton Dubois MD.  [facility] 09/25/2020,  2:42 PM

## 2020-09-25 NOTE — Care Management Important Message (Signed)
Important Message  Patient Details  Name: Kevin Underwood MRN: [id number] Date of Birth: 10-Oct-1961   Medicare Important Message Given:  Yes     Corey Harold 09/25/2020, 11:37 AM

## 2020-09-25 NOTE — Progress Notes (Signed)
Inpatient Diabetes Program Recommendations  AACE/ADA: New Consensus Statement on Inpatient Glycemic Control (2015)  Target Ranges:  Prepandial:   less than 140 mg/dL      Peak postprandial:   less than 180 mg/dL (1-2 hours)      Critically ill patients:  140 - 180 mg/dL   Lab Results  Component Value Date   GLUCAP 151 (H) 09/25/2020   HGBA1C 9.4 (H) 09/22/2020    Review of Glycemic Control  Diabetes history: DM 2 Outpatient Diabetes medications: Metformin 1000 BID, Invokamet XR [phone] QD Current orders for Inpatient glycemic control:  Levemir 15 units bid Novolog 0-15 units tid + hs  PO prednisone 40 mg Daily (starting today)  Inpatient Diabetes Program Recommendations:    Spoke with pt over the phone  Regarding A1c ad glucose control. Discussed need for glucose control. Pt reports needing glucose meter at time of d/c. Discussed A1c and glucose goals.  Pt has follow up with PCP office scheduled for tomorrow 10/19.    Glucose meter kit order # [id number]  Thanks,  Tama Headings RN, MSN, BC-ADM Inpatient Diabetes Coordinator Team Pager [phone] (8a-5p)

## 2020-09-25 NOTE — Progress Notes (Addendum)
Progress Note  Patient Name: Kevin Underwood Date of Encounter: 09/25/2020  [facility] Cardiologist: Nona Dell, MD   Subjective   Feels great. Wants to go home  Inpatient Medications    Scheduled Meds: . aspirin EC  81 mg Oral Daily  . budesonide (PULMICORT) nebulizer solution  0.5 mg Nebulization BID  . carvedilol  6.25 mg Oral BID WC  . clopidogrel  75 mg Oral Daily  . digoxin  0.125 mg Oral Daily  . furosemide  40 mg Intravenous BID  . heparin injection (subcutaneous)  5,000 Units Subcutaneous Q8H  . insulin aspart  0-15 Units Subcutaneous TID WC  . insulin aspart  0-5 Units Subcutaneous QHS  . insulin detemir  15 Units Subcutaneous BID  . ipratropium-albuterol  3 mL Nebulization QID  . magnesium oxide  400 mg Oral BID  . multivitamin with minerals  1 tablet Oral Daily  . pantoprazole  40 mg Oral Daily  . potassium chloride SA  20 mEq Oral Daily  . predniSONE  40 mg Oral Q breakfast  . rosuvastatin  40 mg Oral Daily  . sacubitril-valsartan  1 tablet Oral BID   Continuous Infusions:  PRN Meds: acetaminophen, ALPRAZolam, ondansetron (ZOFRAN) IV, traZODone   Vital Signs    Vitals:   09/24/20 2102 09/25/20 0532 09/25/20 0739 09/25/20 0751  BP: 103/67 104/64 100/60   Pulse: 87 77 80   Resp: 20 18 18    Temp: (!) 97.5 F (36.4 C) 99.7 F (37.6 C)  (!) 97.2 F (36.2 C)  TempSrc:    Tympanic  SpO2: 96% 96% 100% 99%  Weight:  81.5 kg    Height:        Intake/Output Summary (Last 24 hours) at 09/25/2020 0953 Last data filed at 09/25/2020 0700 Gross per 24 hour  Intake 240 ml  Output 2700 ml  Net -2460 ml   Last 3 Weights 09/25/2020 09/23/2020 09/23/2020  Weight (lbs) 179 lb 11.2 oz 181 lb 181 lb 11.2 oz  Weight (kg) 81.511 kg 82.1 kg 82.419 kg      Telemetry    NSR- Personally Reviewed  ECG     Physical Exam    GEN: No acute distress.   Neck: No JVD Cardiac: RRR, no murmurs, rubs, or gallops.  Respiratory: Clear to auscultation  bilaterally. GI: Soft, nontender, non-distended  MS: No edema; No deformity. Neuro:  Nonfocal  Psych: Normal affect   Labs    High Sensitivity Troponin:   Recent Labs  Lab 09/22/20 0348 09/22/20 0620 09/22/20 0944 09/22/20 1242  TROPONINIHS 35* 33* 33* 28*      Chemistry Recent Labs  Lab 09/22/20 0348 09/24/20 0522 09/25/20 0737  NA 134* 133* 137  K 3.5 3.9 3.2*  CL 100 98 97*  CO2 24 25 29   GLUCOSE 296* 268* 138*  BUN 22* 37* 33*  CREATININE 0.73 0.74 0.74  CALCIUM 8.7* 9.0 8.7*  GFRNONAA >60 >60 >60  ANIONGAP 10 10 11      Hematology Recent Labs  Lab 09/22/20 0348 09/23/20 0859 09/24/20 0522  WBC 8.2 14.5* 14.0*  RBC 4.25 4.94 4.65  HGB 12.3* 14.1 12.9*  HCT 36.4* 42.8 39.6  MCV 85.6 86.6 85.2  MCH 28.9 28.5 27.7  MCHC 33.8 32.9 32.6  RDW 15.4 15.5 15.7*  PLT 226 297 308    BNP Recent Labs  Lab 09/22/20 0348  BNP 939.0*     DDimer No results for input(s): DDIMER in the last 168 hours.  Radiology    No results found.  Cardiac Studies   Echo 09/21/20 IMPRESSIONS     1. Left ventricular ejection fraction, by estimation, is 20 to 25%. The  left ventricle has severely decreased function. The left ventricle  demonstrates global hypokinesis. The left ventricular internal cavity size  was mildly dilated. Left ventricular  diastolic parameters are indeterminate.   2. Right ventricular systolic function is mildly reduced. The right  ventricular size is mildly enlarged.   3. Left atrial size was moderately dilated.   4. Right atrial size was moderately dilated.   5. The pericardial effusion is circumferential.   6. The mitral valve is abnormal. Moderate mitral valve regurgitation. No  evidence of mitral stenosis.   7. The tricuspid valve is abnormal. Tricuspid valve regurgitation is  moderate.   8. The aortic valve is tricuspid. Aortic valve regurgitation is not  visualized. No aortic stenosis is present.   9. The inferior vena cava is  normal in size with greater than 50%  respiratory variability, suggesting right atrial pressure of 3 mmHg.    Patient Profile     59 y.o. male with past medical history of CAD (s/p CABG in 11/2019 with LIMA-LAD, SVG-OM1, SVG-PDA and SVG-D1), chronic systolic CHF/ICM (EF < 20% by echo in 11/2019), paroxysmal atrial fibrillation (s/p LAA clipping in 11/2019), pulmonary HTN, HTN, HLD, Type 2 DM and COPD who is being seen today for the evaluation of CHF     Assessment & Plan     Acute on chronic systolic CHF due to dietary indiscretion.LVEF 20-25% echo 10/14 with global HK mod MR/TR on Coreg, dig, entresto. Consider EPS eval for possible ICD as outpatient. I/O's negative 2.4 L yest total 1.7 L since admission on Lasix 40 mg IV BID. K 3.2 needs replaced. Patient feeling back to normal. Wants to go home-will schedule f/u  CAD s/p CABG 11/2019 troponins flat  PAF S/P MAZE and clipping of atrial appendage 11/2019 maintainng NSR on Amio-stopped last week by Dr. Tenny Craw  HTN-BP on low side  HLD on Crestor   [facility] will sign off.   Medication Recommendations:  Continue current meds including Lasix 40 mg bid and can take an extra 40 mg prn for edema or shortness of breath Other recommendations (labs, testing, etc): 2 gm sodium diet. Follow up as an outpatient:  Jacolyn Reedy PA-C November 2nd8:15  For questions or updates, please contact [facility] Please consult [email or web address] for contact info under        Signed, Jacolyn Reedy, PA-C  09/25/2020, 9:53 AM    Attending note Patient seen and discussed with PA Geni Bers, I agree with her documentation. Admitted with acute on chronic systolic HF. Echo performed just prior to admission showed his EF remains reduced at 20 to 25% with global hypokinesis and mildly reduced RV function, overall stable findings. I/Os incomplete this admission, weights appear inaccurate. Significant improvement in symptoms with diuresis. Atypical chest pains  resolved with diuresis, no evidence of ACS. Continue medical therapy with coreg 6.25mg  bid, digoxin 0.125, entresto 24/26mg  bid. Would discharge on oral lasix 80mg  in am and 40mg  in pm.    MD

## 2020-09-25 NOTE — Progress Notes (Signed)
Nsg Discharge Note  Admit Date:  09/22/2020 Discharge date: 09/25/2020   Colin Ina to be D/C'd Home per MD order.  AVS completed.  Patient able to verbalize understanding.  Discharge Medication: Allergies as of 09/25/2020   No Known Allergies     Medication List    STOP taking these medications   amiodarone 200 MG tablet Commonly known as: PACERONE   colchicine 0.6 MG tablet   Invokamet XR [id number] MG Tb24 Generic drug: Canagliflozin-metFORMIN HCl ER     TAKE these medications   albuterol (2.5 MG/3ML) 0.083% nebulizer solution Commonly known as: PROVENTIL Take 2.5 mg by nebulization every 6 (six) hours as needed for wheezing or shortness of breath.   blood glucose meter kit and supplies Kit Dispense based on patient and insurance preference. Use to check CBG's three times a day. (FOR ICD-9 250.00, 250.01).   carvedilol 6.25 MG tablet Commonly known as: COREG Take 1 tablet (6.25 mg total) by mouth 2 (two) times daily with a meal.   clopidogrel 75 MG tablet Commonly known as: PLAVIX Take 1 tablet (75 mg total) by mouth daily.   Combivent Respimat 20-100 MCG/ACT Aers respimat Generic drug: Ipratropium-Albuterol Inhale 1 puff into the lungs every 6 (six) hours as needed.   dapagliflozin propanediol 5 MG Tabs tablet Commonly known as: FARXIGA Take 1 tablet (5 mg total) by mouth daily before breakfast.   digoxin 0.125 MG tablet Commonly known as: LANOXIN Take 1 tablet (0.125 mg total) by mouth daily.   furosemide 80 MG tablet Commonly known as: Lasix Take 0.5-1 tablets (40-80 mg total) by mouth See admin instructions. 80mg  in the am 40mg  in the evening   gabapentin 600 MG tablet Commonly known as: NEURONTIN Take 1 tablet (600 mg total) by mouth 3 (three) times daily. What changed:   when to take this  Another medication with the same name was removed. Continue taking this medication, and follow the directions you see here.   magnesium oxide 400 (241.3  Mg) MG tablet Commonly known as: MAG-OX Take 1 tablet (400 mg total) by mouth daily. What changed: when to take this   metFORMIN 500 MG tablet Commonly known as: GLUCOPHAGE Take 2 tablets (1,000 mg total) by mouth 2 (two) times daily with a meal.   mometasone-formoterol 200-5 MCG/ACT Aero Commonly known as: DULERA Inhale 2 puffs into the lungs in the morning and at bedtime.   multivitamin with minerals Tabs tablet Take 1 tablet by mouth daily.   pantoprazole 40 MG tablet Commonly known as: PROTONIX Take 1 tablet (40 mg total) by mouth daily. Start taking on: September 26, 2020   potassium chloride SA 20 MEQ tablet Commonly known as: KLOR-CON Take 2 tablets (40 mEq total) by mouth 2 (two) times daily. What changed:   how much to take  when to take this   predniSONE 20 MG tablet Commonly known as: DELTASONE Take 2 tablets (40 mg total) by mouth daily with breakfast for 5 days. And stop prednisone after that. Start taking on: September 26, 2020   rosuvastatin 40 MG tablet Commonly known as: CRESTOR Take 1 tablet (40 mg total) by mouth daily. What changed: See the new instructions.   sacubitril-valsartan 24-26 MG Commonly known as: ENTRESTO Take 1 tablet by mouth 2 (two) times daily.   traZODone 50 MG tablet Commonly known as: DESYREL Take 50 mg by mouth at bedtime.       Discharge Assessment: Vitals:   09/25/20 1258 09/25/20 1351  BP:  102/64  Pulse:  80  Resp:  16  Temp:    SpO2: 96% 97%   Skin clean, dry and intact without evidence of skin break down, no evidence of skin tears noted. IV catheter discontinued intact. Site without signs and symptoms of complications - no redness or edema noted at insertion site, patient denies c/o pain - only slight tenderness at site.  Dressing with slight pressure applied.  D/c Instructions-Education: Discharge instructions given to patient with verbalized understanding. D/c education completed with patient including follow  up instructions, medication list, d/c activities limitations if indicated, with other d/c instructions as indicated by MD - patient able to verbalize understanding, all questions fully answered. Patient instructed to return to ED, call 911, or call MD for any changes in condition.  Patient escorted via [place], and D/C home via private auto.  Loyda Costin Loletha Grayer, RN 09/25/2020 3:30 PM

## 2020-09-29 ENCOUNTER — Other Ambulatory Visit: Payer: Self-pay | Admitting: Family Medicine

## 2020-10-03 NOTE — Progress Notes (Deleted)
Cardiology Office Note    Date:  10/03/2020   ID:  Kevin Underwood, DOB [birth year], MRN [id number]  PCP:  Kirstie Peri, MD  Cardiologist: Nona Dell, MD EPS: None  No chief complaint on file.   History of Present Illness:  Kevin Underwood is a 59 y.o. male with past medical history of CAD s/p CABG in 11/2019 with LIMA-LAD, SVG-OM1, SVG-PDA and SVG-D1, chronic systolic CHF/ICM (EF < 20% by echo in 11/2019), paroxysmal atrial fibrillation (s/p LAA clipping in 11/2019), pulmonary HTN, HTN, HLD, Type 2 DM and COPD.      Past Medical History:  Diagnosis Date  . Arthritis   . CAD (coronary artery disease)    a. s/p CABG in 11/2019 with LIMA-LAD, SVG-OM1, SVG-PDA and SVG-D1  . CHF (congestive heart failure) (HCC)    a. EF < 20% by echo in 11/2019  . COPD (chronic obstructive pulmonary disease) (HCC)   . Essential hypertension   . Type 2 diabetes mellitus (HCC)     Past Surgical History:  Procedure Laterality Date  . BACK SURGERY    . CLIPPING OF ATRIAL APPENDAGE N/A 11/26/2019   Procedure: Clipping Of Atrial Appendage using AtriCure 40 Clip;  Surgeon: [name], MD;  Location: [facility] OR;  Service: Open Heart Surgery;  Laterality: N/A;  . CORONARY ARTERY BYPASS GRAFT N/A 11/26/2019   Procedure: CORONARY ARTERY BYPASS GRAFTING (CABG) using endoscopic greater saphenous vein harvest: svc to OM; svc to Diag; svc to PD; and LIMA to LAD.;  Surgeon: [name], MD;  Location: [facility] OR;  Service: Open Heart Surgery;  Laterality: N/A;  . FOOT SURGERY    . HAND SURGERY    . INTRAOPERATIVE TRANSESOPHAGEAL ECHOCARDIOGRAM  12/04/2019   Procedure: Intraoperative Transesophageal Echocardiogram;  Surgeon: [name], MD;  Location: [facility] OR;  Service: Open Heart Surgery;;  . MAZE N/A 11/26/2019   Procedure: MAZE using Bilateral Pulmonary Vein isolation.;  Surgeon: [name], MD;  Location: [facility] OR;  Service: Open Heart Surgery;  Laterality: N/A;  . PLACEMENT OF  IMPELLA LEFT VENTRICULAR ASSIST DEVICE N/A 11/24/2019   Procedure: PLACEMENT OF IMPELLA 5.5 LEFT VENTRICULAR ASSIST DEVICE;  Surgeon: [name], MD;  Location: [facility] OR;  Service: Open Heart Surgery;  Laterality: N/A;  . REMOVAL OF IMPELLA LEFT VENTRICULAR ASSIST DEVICE Right 12/04/2019   Procedure: REMOVAL OF IMPELLA LEFT VENTRICULAR ASSIST DEVICE, right axilla;  Surgeon: [name], MD;  Location: [facility] OR;  Service: Open Heart Surgery;  Laterality: Right;  . RIGHT/LEFT HEART CATH AND CORONARY ANGIOGRAPHY N/A 11/24/2019   Procedure: RIGHT/LEFT HEART CATH AND CORONARY ANGIOGRAPHY;  Surgeon: Laurey Morale, MD;  Location: [facility] INVASIVE CV LAB;  Service: Cardiovascular;  Laterality: N/A;  . TEE WITHOUT CARDIOVERSION N/A 11/24/2019   Procedure: TRANSESOPHAGEAL ECHOCARDIOGRAM (TEE);  Surgeon: [name], MD;  Location[facility] OR;  Service: Open Heart Surgery;  Laterality: N/A;  . TEE WITHOUT CARDIOVERSION N/A 11/26/2019   Procedure: TRANSESOPHAGEAL ECHOCARDIOGRAM (TEE);  Surgeon: [name], MD;  Location: [facility] OR;  Service: Open Heart Surgery;  Laterality: N/A;    Current Medications: No outpatient medications have been marked as taking for the 10/10/20 encounter (Appointment) with Dyann Kief, PA-C.     Allergies:   Patient has no known allergies.   Social History   Socioeconomic History  . Marital status: Married    Spouse name: Not on file  . Number of children: Not on file  . Years of  education: 70  . Highest education level: Not on file  Occupational History  . Not on file  Tobacco Use  . Smoking status: Former Smoker    Packs/day: 1.00    Years: 30.00    Pack years: 30.00    Types: Cigarettes    Quit date: 12/20/2019    Years since quitting: 0.7  . Smokeless tobacco: Never Used  Vaping Use  . Vaping Use: Never used  Substance and Sexual Activity  . Alcohol use: Yes    Comment: Prior history of excessive intake  . Drug use: No  . Sexual activity: Not  on file  Other Topics Concern  . Not on file  Social History Narrative  . Not on file   Social Determinants of Health   Financial Resource Strain:   . Difficulty of Paying Living Expenses: Not on file  Food Insecurity:   . Worried About Programme researcher, broadcasting/film/video in the Last Year: Not on file  . Ran Out of Food in the Last Year: Not on file  Transportation Needs:   . Lack of Transportation (Medical): Not on file  . Lack of Transportation (Non-Medical): Not on file  Physical Activity:   . Days of Exercise per Week: Not on file  . Minutes of Exercise per Session: Not on file  Stress:   . Feeling of Stress : Not on file  Social Connections:   . Frequency of Communication with Friends and Family: Not on file  . Frequency of Social Gatherings with Friends and Family: Not on file  . Attends Religious Services: Not on file  . Active Member of Clubs or Organizations: Not on file  . Attends Banker Meetings: Not on file  . Marital Status: Not on file     Family History:  The patient's ***family history includes Arthritis in an other family member; Asthma in an other family member; Diabetes in an other family member; Heart disease in his sister; Lung disease in an other family member.   ROS:   Please see the history of present illness.    ROS All other systems reviewed and are negative.   PHYSICAL EXAM:   VS:  There were no vitals taken for this visit.  Physical Exam  GEN: Well nourished, well developed, in no acute distress  HEENT: normal  Neck: no JVD, carotid bruits, or masses Cardiac:RRR; no murmurs, rubs, or gallops  Respiratory:  clear to auscultation bilaterally, normal work of breathing GI: soft, nontender, nondistended, + BS Ext: without cyanosis, clubbing, or edema, Good distal pulses bilaterally MS: no deformity or atrophy  Skin: warm and dry, no rash Neuro:  Alert and Oriented x 3, Strength and sensation are intact Psych: euthymic mood, full affect  Wt  Readings from Last 3 Encounters:  09/25/20 179 lb 11.2 oz (81.5 kg)  05/24/20 173 lb 3.2 oz (78.6 kg)  02/15/20 166 lb 3.2 oz (75.4 kg)      Studies/Labs Reviewed:   EKG:  EKG is*** ordered today.  The ekg ordered today demonstrates ***  Recent Labs: 11/20/2019: TSH 1.658 11/26/2019: ALT 31 09/22/2020: B Natriuretic Peptide 939.0; Magnesium 2.1 09/24/2020: Hemoglobin 12.9; Platelets 308 09/25/2020: BUN 33; Creatinine, Ser 0.74; Potassium 3.2; Sodium 137   Lipid Panel    Component Value Date/Time   CHOL 200 09/23/2020 0859   TRIG 116 09/23/2020 0859   HDL 53 09/23/2020 0859   CHOLHDL 3.8 09/23/2020 0859   VLDL 23 09/23/2020 0859   LDLCALC 124 (H)  09/23/2020 0859    Additional studies/ records that were reviewed today include:  Echo 09/21/20 IMPRESSIONS     1. Left ventricular ejection fraction, by estimation, is 20 to 25%. The  left ventricle has severely decreased function. The left ventricle  demonstrates global hypokinesis. The left ventricular internal cavity size  was mildly dilated. Left ventricular  diastolic parameters are indeterminate.   2. Right ventricular systolic function is mildly reduced. The right  ventricular size is mildly enlarged.   3. Left atrial size was moderately dilated.   4. Right atrial size was moderately dilated.   5. The pericardial effusion is circumferential.   6. The mitral valve is abnormal. Moderate mitral valve regurgitation. No  evidence of mitral stenosis.   7. The tricuspid valve is abnormal. Tricuspid valve regurgitation is  moderate.   8. The aortic valve is tricuspid. Aortic valve regurgitation is not  visualized. No aortic stenosis is present.   9. The inferior vena cava is normal in size with greater than 50%  respiratory variability, suggesting right atrial pressure of 3 mmHg.        ASSESSMENT:    1. Chronic systolic CHF (congestive heart failure) (HCC)   2. Coronary artery disease involving coronary bypass  graft of native heart without angina pectoris   3. Paroxysmal atrial fibrillation (HCC)   4. Essential hypertension   5. Hyperlipidemia, unspecified hyperlipidemia type      PLAN:  In order of problems listed above:  chronic systolic CHF discharged 09/25/2020 with acute exacerbation due to dietary indiscretion.LVEF 20-25% echo 10/14 with global HK mod MR/TR on Coreg, dig, entresto. Consider EPS eval for possible ICD as outpatient.     CAD s/p CABG 11/2019 troponins flat   PAF S/P MAZE and clipping of atrial appendage 11/2019 maintainng NSR on Amio-stopped last week by Dr. Tenny Craw   HTN-BP on low side   HLD on Crestor       Medication Adjustments/Labs and Tests Ordered: Current medicines are reviewed at length with the patient today.  Concerns regarding medicines are outlined above.  Medication changes, Labs and Tests ordered today are listed in the Patient Instructions below. There are no Patient Instructions on file for this visit.   Elson Clan, PA-C  10/03/2020 3:19 PM    [facility] [address] [place], [place], [place]  [address] Phone: [phone]; Fax: [phone]

## 2020-10-04 ENCOUNTER — Ambulatory Visit: Payer: 59 | Admitting: Cardiology

## 2020-10-10 ENCOUNTER — Ambulatory Visit: Payer: 59 | Admitting: Physician Assistant

## 2020-10-10 DIAGNOSIS — I480 Paroxysmal atrial fibrillation: Secondary | ICD-10-CM

## 2020-10-10 DIAGNOSIS — I25810 Atherosclerosis of coronary artery bypass graft(s) without angina pectoris: Secondary | ICD-10-CM

## 2020-10-10 DIAGNOSIS — E785 Hyperlipidemia, unspecified: Secondary | ICD-10-CM

## 2020-10-10 DIAGNOSIS — I5022 Chronic systolic (congestive) heart failure: Secondary | ICD-10-CM

## 2020-10-10 DIAGNOSIS — I10 Essential (primary) hypertension: Secondary | ICD-10-CM

## 2020-10-11 NOTE — Progress Notes (Signed)
Cardiology Office Note  Date: 10/12/2020   ID: Kevin Underwood, DOB 08-Nov-1961, MRN [id number]  PCP:  Monico Blitz, MD  Cardiologist:  Rozann Lesches, MD Electrophysiologist:  None   Chief Complaint: F/U CAD S/P CABG x 4, HFrEF, PAF, Cardiomyopathy  History of Present Illness: Kevin Underwood is a 59 y.o. male with a history of CAD status post CABG December 2020, HFrEF, PAF, cardiomyopathy, COPD, HTN, type 2 diabetes, substance abuse, OSA. NSTEMI with Impella LVAD insertion.   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 SVG-OM, SVG-Diag, SVG-PD, LIMA-LAD with  MAZE and LAA clipping.  Echo 12/05/2019 LVEF 20 to 25%, abnormal septal motion consistent with postoperative status, grade 2 DD, moderately dilated LV internal cavity size, LV global hypokinesis, global RV moderately reduced systolic function. RV size moderately enlarged, LA moderately dilated, mild to moderate MR, mild elevation PASP at 31.5 mmHg  Patient is here today for follow-up.  Patient states he has not apparently been on medication that he should have been on including carvedilol, clopidogrel, digoxin, Entresto.  States his only issue is feeling fatigued.  States he had some issues with his chest and median sternotomy with complaints of burning and pain previously and was started on gabapentin for treatment.  He denies any progressive anginal or exertional symptoms, palpitations or arrhythmias, orthostatic symptoms, CVA or TIA-like symptoms, bleeding issues, lower extremity edema, orthopnea, PND.  States he has stopped smoking for approximately 6 months.  Recent hospital admission on 09/22/2020 with complaints of shortness of breath at rest and with exertion, orthopnea.  Also complained of having pain on and on for the prior 2 weeks, mainly on exertion and associated with shortness of breath 7-8/10 in intensity.  Localized in the middle of his chest and described as pressure type.  It was nonradiating and reported decreasing after using aspirin  and nitroglycerin.  Had vascular congestion on chest x-ray and elevated BNP.  He was treated for CHF exacerbation with demand ischemia and COPD.  At discharge he was to continue 80 mg Lasix a.m. and 40 mg in afternoon.  Continue to use beta-blocker, digoxin,  and Entresto.  Patient is here for hospital follow-up with recent admission for acute on chronic CHF.  He denies any subsequent shortness of breath, anginal or exertional symptoms, weight gain, lower extremity edema.  States he has been weighing himself every day and maintaining weight around 175.  He has been taking his Lasix as directed.  Denies any palpitations or arrhythmias, orthostatic symptoms, CVA or TIA-like symptoms, bleeding issues.  No claudication or DVT/PE-like symptoms.  Dr. Harrington Challenger consulted on the patient and recommended starting spironolactone before discharge from the hospital but apparently blood pressure would not tolerate at the time.  She also recommended patient see EP for possible ICD placement after discharge on outpatient basis.   Past Medical History:  Diagnosis Date  . Arthritis   . CAD (coronary artery disease)    a. s/p CABG in 11/2019 with LIMA-LAD, SVG-OM1, SVG-PDA and SVG-D1  . CHF (congestive heart failure) ([place])    a. EF < 20% by echo in 11/2019  . COPD (chronic obstructive pulmonary disease) ([place])   . Essential hypertension   . Type 2 diabetes mellitus ([place])     Past Surgical History:  Procedure Laterality Date  . BACK SURGERY    . CLIPPING OF ATRIAL APPENDAGE N/A 11/26/2019   Procedure: Clipping Of Atrial Appendage using AtriCure 40 Clip;  Surgeon: Wonda Olds, MD;  Location: [place];  Service: Open Heart Surgery;  Laterality: N/A;  . CORONARY ARTERY BYPASS GRAFT N/A 11/26/2019   Procedure: CORONARY ARTERY BYPASS GRAFTING (CABG) using endoscopic greater saphenous vein harvest: svc to OM; svc to Diag; svc to PD; and LIMA to LAD.;  Surgeon: Wonda Olds, MD;  Location: [place];  Service: Open Heart  Surgery;  Laterality: N/A;  . FOOT SURGERY    . HAND SURGERY    . INTRAOPERATIVE TRANSESOPHAGEAL ECHOCARDIOGRAM  12/04/2019   Procedure: Intraoperative Transesophageal Echocardiogram;  Surgeon: Wonda Olds, MD;  Location[facility];  Service: Open Heart Surgery;;  . MAZE N/A 11/26/2019   Procedure: MAZE using Bilateral Pulmonary Vein isolation.;  Surgeon: Wonda Olds, MD;  Location: [place];  Service: Open Heart Surgery;  Laterality: N/A;  . PLACEMENT OF IMPELLA LEFT VENTRICULAR ASSIST DEVICE N/A 11/24/2019   Procedure: PLACEMENT OF IMPELLA 5.5 LEFT VENTRICULAR ASSIST DEVICE;  Surgeon: Wonda Olds, MD;  Location: [place];  Service: Open Heart Surgery;  Laterality: N/A;  . REMOVAL OF IMPELLA LEFT VENTRICULAR ASSIST DEVICE Right 12/04/2019   Procedure: REMOVAL OF IMPELLA LEFT VENTRICULAR ASSIST DEVICE, right axilla;  Surgeon: Wonda Olds, MD;  Location: ;  Service: Open Heart Surgery;  Laterality: Right;  . RIGHT/LEFT HEART CATH AND CORONARY ANGIOGRAPHY N/A 11/24/2019   Procedure: RIGHT/LEFT HEART CATH AND CORONARY ANGIOGRAPHY;  Surgeon: Larey Dresser, MD;  Location: [place] [facility];  Service: Cardiovascular;  Laterality: N/A;  . TEE WITHOUT CARDIOVERSION N/A 11/24/2019   Procedure: TRANSESOPHAGEAL ECHOCARDIOGRAM (TEE);  Surgeon: Wonda Olds, MD;  Location: [place];  Service: Open Heart Surgery;  Laterality: N/A;  . TEE WITHOUT CARDIOVERSION N/A 11/26/2019   Procedure: TRANSESOPHAGEAL ECHOCARDIOGRAM (TEE);  Surgeon: Wonda Olds, MD;  Location: [place];  Service: Open Heart Surgery;  Laterality: N/A;    Current Outpatient Medications  Medication Sig Dispense Refill  . albuterol (PROVENTIL) (2.5 MG/3ML) 0.083% nebulizer solution Take 2.5 mg by nebulization every 6 (six) hours as needed for wheezing or shortness of breath.     . blood glucose meter kit and supplies KIT Dispense based on patient and insurance preference. Use to check CBG's three times a day. (FOR  ICD-9 250.00, 250.01). 1 each 0  . carvedilol (COREG) 6.25 MG tablet TAKE 1 TABLET(6.25 MG) BY MOUTH TWICE DAILY WITH A MEAL 180 tablet 1  . clopidogrel (PLAVIX) 75 MG tablet TAKE 1 TABLET(75 MG) BY MOUTH DAILY 90 tablet 1  . COMBIVENT RESPIMAT 20-100 MCG/ACT AERS respimat Inhale 1 puff into the lungs every 6 (six) hours as needed. 4 g 1  . dapagliflozin propanediol (FARXIGA) 5 MG TABS tablet Take 1 tablet (5 mg total) by mouth daily before breakfast. 30 tablet 3  . digoxin (LANOXIN) 0.125 MG tablet TAKE 1 TABLET(0.125 MG) BY MOUTH DAILY 90 tablet 1  . ENTRESTO 24-26 MG TAKE 1 TABLET BY MOUTH TWICE DAILY 60 tablet 3  . furosemide (LASIX) 80 MG tablet Take 0.5-1 tablets (40-80 mg total) by mouth See admin instructions. 80mg  in the am 40mg  in the evening 45 tablet 2  . gabapentin (NEURONTIN) 600 MG tablet Take 1 tablet (600 mg total) by mouth 3 (three) times daily. 90 tablet 0  . magnesium oxide (MAG-OX) 400 (241.3 Mg) MG tablet Take 1 tablet (400 mg total) by mouth daily. 30 tablet 2  . metFORMIN (GLUCOPHAGE) 500 MG tablet Take 2 tablets (1,000 mg total) by mouth 2 (two) times daily with a meal. 60 tablet 2  .  mometasone-formoterol (DULERA) 200-5 MCG/ACT AERO Inhale 2 puffs into the lungs in the morning and at bedtime. 1 each 2  . Multiple Vitamin (MULTIVITAMIN WITH MINERALS) TABS tablet Take 1 tablet by mouth daily.    . pantoprazole (PROTONIX) 40 MG tablet Take 1 tablet (40 mg total) by mouth daily. 30 tablet 2  . potassium chloride SA (KLOR-CON) 20 MEQ tablet Take 2 tablets (40 mEq total) by mouth 2 (two) times daily. 120 tablet 2  . rosuvastatin (CRESTOR) 40 MG tablet Take 1 tablet (40 mg total) by mouth daily. 30 tablet 3  . traZODone (DESYREL) 50 MG tablet Take 50 mg by mouth at bedtime.     No current facility-administered medications for this visit.   Allergies:  Patient has no known allergies.   Social History: The patient  reports that he quit smoking about 9 months ago. His smoking  use included cigarettes. He has a 30.00 pack-year smoking history. He has never used smokeless tobacco. He reports current alcohol use. He reports that he does not use drugs.   Family History: The patient's family history includes Arthritis in an other family member; Asthma in an other family member; Diabetes in an other family member; Heart disease in his sister; Lung disease in an other family member.   ROS:  Please see the history of present illness. Otherwise, complete review of systems is positive for none.  All other systems are reviewed and negative.   Physical Exam: VS:  BP 140/78   Pulse 96   Ht 5\' 10"  (1.778 m)   Wt 178 lb 9.6 oz (81 kg)   SpO2 99%   BMI 25.63 kg/m , BMI Body mass index is 25.63 kg/m.  Wt Readings from Last 3 Encounters:  10/12/20 178 lb 9.6 oz (81 kg)  09/25/20 179 lb 11.2 oz (81.5 kg)  05/24/20 173 lb 3.2 oz (78.6 kg)    General: Patient appears comfortable at rest. Neck: Supple, no elevated JVP or carotid bruits, no thyromegaly. Lungs: Clear to auscultation, nonlabored breathing at rest. Cardiac: Regular rate and rhythm, no S3 or significant systolic murmur, no pericardial rub. Extremities: No pitting edema, distal pulses 2+. Skin: Warm and dry.  Median sternotomy scar and chest tube insertion sites are clean and dry without redness or swelling. Musculoskeletal: No kyphosis. Neuropsychiatric: Alert and oriented x3, affect grossly appropriate.  ECG:  An ECG dated 05/24/2020 was personally reviewed today and demonstrated:  Sinus rhythm rate of 92, right atrial enlargement, minimal voltage criteria for LVH, T wave abnormality consider lateral ischemia, prolonged QT  Recent Labwork: 11/20/2019: TSH 1.658 11/26/2019: ALT 31; AST 50 09/22/2020: B Natriuretic Peptide 939.0; Magnesium 2.1 09/24/2020: Hemoglobin 12.9; Platelets 308 09/25/2020: BUN 33; Creatinine, Ser 0.74; Potassium 3.2; Sodium 137     Component Value Date/Time   CHOL 200 09/23/2020 0859    TRIG 116 09/23/2020 0859   HDL 53 09/23/2020 0859   CHOLHDL 3.8 09/23/2020 0859   VLDL 23 09/23/2020 0859   LDLCALC 124 (H) 09/23/2020 0859    Other Studies Reviewed Today:   Echocardiogram 09/21/2020 1. Left ventricular ejection fraction, by estimation, is 20 to 25%. The left ventricle has severely decreased function. The left ventricle demonstrates global hypokinesis. The left ventricular internal cavity size was mildly dilated. Left ventricular diastolic parameters are indeterminate. 2. Right ventricular systolic function is mildly reduced. The right ventricular size is mildly enlarged. 3. Left atrial size was moderately dilated. 4. Right atrial size was moderately dilated. 5. The pericardial effusion  is circumferential. 6. The mitral valve is abnormal. Moderate mitral valve regurgitation. No evidence of mitral stenosis. 7. The tricuspid valve is abnormal. Tricuspid valve regurgitation is moderate. 8. The aortic valve is tricuspid. Aortic valve regurgitation is not visualized. No aortic stenosis is present. 9. The inferior vena cava is normal in size with greater than 50% respiratory variability, suggesting right atrial pressure of 3 mmHg.   Comparison(s): Prior Echo showed LV EF 20-25%, severely decreased function. Grade II diastolic dysfunction. Moderately reduced RV systolic function. PASP 31.5.      Echocardiogram 12/05/2019  1. Left ventricular ejection fraction, by visual estimation, is 20 to 25%. The left ventricle has severely decreased function. There is no increased left ventricular wall thickness. 2. Abnormal septal motion consistent with post-operative status. 3. Elevated left atrial pressure. 4. Left ventricular diastolic parameters are consistent with Grade II diastolic dysfunction (pseudonormalization). 5. Moderately dilated left ventricular internal cavity size. 6. The left ventricle demonstrates global hypokinesis. 7. Global right ventricle has  moderately reduced systolic function.The right ventricular size is moderately enlarged. no increase in right ventricular wall thickness. 8. Left atrial size was moderately dilated. 9. The mitral valve is normal in structure. There is mild to moderate mitral valve regurgitation. No evidence of mitral stenosis. 10. The tricuspid valve was normal in structure. Tricuspid valve regurgitation is not demonstrated. 11. Tricuspid valve regurgitation is not demonstrated. 12. No evidence of aortic valve sclerosis or stenosis. 13. The pulmonic valve was normal in structure. 14. Mildly elevated pulmonary artery systolic pressure. 15. The tricuspid regurgitant velocity is 2.67 m/s, and with an assumed right atrial pressure of 3 mmHg, the estimated right ventricular systolic pressure is mildly elevated at 31.5 mmHg. (may be higher; inferior vena cava could not be visualized  Conclusion  1. Low output HF with elevated filling pressures.  2. Severe 3 vessel disease with heavily calcified and severely stenotic proximal LAD.    I will increase IV Lasix and start him on milrinone 0.25 to promote diuresis.   Diabetic with 3VD including proximal LAD. He will need cardiac surgery evaluation.  If not taken for surgery, PCI would be an option (LAD lesion would require atherectomy).   Details of Cathetization Left Main  30% distal LM stenosis.  Left Anterior Descending  Heavily calcified proximal and mid LAD. There is 95% stenosis in the proximal LAD just after take-off of a high D1. D1 has moderate diffuse disease.  Left Circumflex  Long 80% mid LCx stenosis proximal to PLOM.  Right Coronary Artery  RCA with 50% proximal stenosis, 30% mid stenosis. PDA with 75% proximal stenosis  Diagnostic Dominance: Right Left Main  30% distal LM stenosis.  Left Anterior Descending  Heavily calcified proximal and mid LAD. There is 95% stenosis in the proximal LAD just after take-off of a high D1. D1 has moderate  diffuse disease.  Left Circumflex  Long 80% mid LCx stenosis proximal to PLOM.  Right Coronary Artery  RCA with 50% proximal stenosis, 30% mid stenosis. PDA with 75% proximal stenosis.  Intervention    Assessment and Plan:   1. CAD in native artery S/P CABG 11/26/2019 SVG-OM, SVG-Diag, SVG-PD, LIMA-LAD.  Continue aspirin 81 mg.  Continue clopidogrel 75 mg.  Continue Crestor 40 mg daily.  He denies any current anginal or exertional symptoms.   2. Ischemic cardiomyopathy Echo EF 20-25%.  Repeat echo 09/21/2020 showed continued decreased LV systolic function with EF of 20 to 25% with global hypokinesis of LV.  LA moderately dilated,  RA moderately dilated, circumferential pericardial effusion.  Moderate MR, moderate TR,  3. Chronic systolic heart failure (HCC) Biverntricular failure on impella LVAD during hospital stay for CABG. at last visit patient stated there was some confusion about his medications and he had not been taking several of them since discharge from the hospital.  Restarted carvedilol 6.25 mg p.o. twice daily, digoxin 0.125 mg daily.  Continue Entresto 24/26 mg p.o. twice daily.Continue Lasix 80 mg daily.  Add spironolactone 12.5 mg p.o. daily.  Get BMP and magnesium in 2 weeks.  Refer to EP for evaluation for ICD.  4. Essential hypertension Blood pressure 138/80.  Continue carvedilol 6.25 mg p.o. twice daily.  Furosemide 80 mg daily.  Continues  5. Paroxysmal atrial fibrillation (HCC) AF with RVR on admission, converted to NSR. S/P Maze procedure. Remained in NSR.    Eliquis if AF recurs.   Continue amiodarone 200 mg daily   6. OSA (obstructive sleep apnea) Strongly suspected . Needs sleep study.  He was referred for sleep study at last visit.  7. Chronic obstructive pulmonary disease, unspecified COPD type ([place]) Evidence of pulmonary HTN . Suspect group 2 + group 3 (COPD) evidence of Emphysema on CT chest.  Patient admits long history of smoking.  Patient states he  quit 6 months ago and has no plans to restart.  8. Type 2 diabetes mellitus with hyperglycemia, unspecified whether long term insulin use ([place]) Poorly controlled per internal medicine. Eventually would benefit from SGLT2 inhibitor.  Managed by PCP  Medication Adjustments/Labs and Tests Ordered: Current medicines are reviewed at length with the patient today.  Concerns regarding medicines are outlined above.   Disposition: Follow-up with Dr. Domenic Polite or APP 1 month  Signed, Levell July, NP 10/12/2020 10:11 AM    [facility], [place], [place] [address] Phone: [phone]; Fax: [phone]

## 2020-10-12 ENCOUNTER — Encounter: Payer: Self-pay | Admitting: Family Medicine

## 2020-10-12 ENCOUNTER — Ambulatory Visit (INDEPENDENT_AMBULATORY_CARE_PROVIDER_SITE_OTHER): Payer: 59 | Admitting: Family Medicine

## 2020-10-12 VITALS — BP 140/78 | HR 96 | Ht 70.0 in | Wt 178.6 lb

## 2020-10-12 DIAGNOSIS — I5023 Acute on chronic systolic (congestive) heart failure: Secondary | ICD-10-CM | POA: Diagnosis not present

## 2020-10-12 DIAGNOSIS — R931 Abnormal findings on diagnostic imaging of heart and coronary circulation: Secondary | ICD-10-CM

## 2020-10-12 DIAGNOSIS — I480 Paroxysmal atrial fibrillation: Secondary | ICD-10-CM | POA: Diagnosis not present

## 2020-10-12 DIAGNOSIS — J441 Chronic obstructive pulmonary disease with (acute) exacerbation: Secondary | ICD-10-CM | POA: Diagnosis not present

## 2020-10-12 DIAGNOSIS — E782 Mixed hyperlipidemia: Secondary | ICD-10-CM

## 2020-10-12 MED ORDER — SPIRONOLACTONE 25 MG PO TABS: 12.5000 mg | ORAL_TABLET | Freq: Every day | ORAL | 6 refills | Status: DC

## 2020-10-12 NOTE — Patient Instructions (Signed)
Medication Instructions:   Begin Spironolactone 12.5mg  daily.   Continue all other medications.    Labwork:  BMET, Magnesium - orders given today.   Office will contact with results via phone or letter.    Testing/Procedures: none  Follow-Up: 1 month   Any Other Special Instructions Will Be Listed Below (If Applicable). You have been referred to:  EP   If you need a refill on your cardiac medications before your next appointment, please call your pharmacy.

## 2020-10-27 ENCOUNTER — Encounter: Payer: Self-pay | Admitting: *Deleted

## 2020-11-06 ENCOUNTER — Institutional Professional Consult (permissible substitution): Payer: 59 | Admitting: Internal Medicine

## 2020-11-08 ENCOUNTER — Encounter (HOSPITAL_COMMUNITY): Payer: Self-pay

## 2020-11-08 ENCOUNTER — Inpatient Hospital Stay (HOSPITAL_COMMUNITY)
Admission: EM | Admit: 2020-11-08 | Discharge: 2020-11-28 | DRG: 001 | Disposition: A | Discharge status: Home / Self Care | Payer: 59 | Attending: Cardiothoracic Surgery | Admitting: Cardiothoracic Surgery

## 2020-11-08 ENCOUNTER — Other Ambulatory Visit: Payer: Self-pay

## 2020-11-08 ENCOUNTER — Emergency Department (HOSPITAL_COMMUNITY): Payer: 59

## 2020-11-08 DIAGNOSIS — I5084 End stage heart failure: Secondary | ICD-10-CM | POA: Diagnosis present

## 2020-11-08 DIAGNOSIS — Z95811 Presence of heart assist device: Secondary | ICD-10-CM

## 2020-11-08 DIAGNOSIS — K083 Retained dental root: Secondary | ICD-10-CM | POA: Diagnosis present

## 2020-11-08 DIAGNOSIS — Z79899 Other long term (current) drug therapy: Secondary | ICD-10-CM

## 2020-11-08 DIAGNOSIS — Z8674 Personal history of sudden cardiac arrest: Secondary | ICD-10-CM

## 2020-11-08 DIAGNOSIS — Z825 Family history of asthma and other chronic lower respiratory diseases: Secondary | ICD-10-CM | POA: Diagnosis not present

## 2020-11-08 DIAGNOSIS — Z8249 Family history of ischemic heart disease and other diseases of the circulatory system: Secondary | ICD-10-CM

## 2020-11-08 DIAGNOSIS — I25110 Atherosclerotic heart disease of native coronary artery with unstable angina pectoris: Secondary | ICD-10-CM | POA: Diagnosis present

## 2020-11-08 DIAGNOSIS — I255 Ischemic cardiomyopathy: Secondary | ICD-10-CM | POA: Diagnosis present

## 2020-11-08 DIAGNOSIS — R0602 Shortness of breath: Secondary | ICD-10-CM

## 2020-11-08 DIAGNOSIS — Z6824 Body mass index (BMI) 24.0-24.9, adult: Secondary | ICD-10-CM

## 2020-11-08 DIAGNOSIS — I428 Other cardiomyopathies: Secondary | ICD-10-CM | POA: Diagnosis not present

## 2020-11-08 DIAGNOSIS — F1011 Alcohol abuse, in remission: Secondary | ICD-10-CM | POA: Diagnosis present

## 2020-11-08 DIAGNOSIS — K029 Dental caries, unspecified: Secondary | ICD-10-CM | POA: Diagnosis present

## 2020-11-08 DIAGNOSIS — Z01810 Encounter for preprocedural cardiovascular examination: Secondary | ICD-10-CM

## 2020-11-08 DIAGNOSIS — I200 Unstable angina: Secondary | ICD-10-CM

## 2020-11-08 DIAGNOSIS — G4733 Obstructive sleep apnea (adult) (pediatric): Secondary | ICD-10-CM | POA: Diagnosis present

## 2020-11-08 DIAGNOSIS — D62 Acute posthemorrhagic anemia: Secondary | ICD-10-CM | POA: Diagnosis not present

## 2020-11-08 DIAGNOSIS — I340 Nonrheumatic mitral (valve) insufficiency: Secondary | ICD-10-CM

## 2020-11-08 DIAGNOSIS — Z7189 Other specified counseling: Secondary | ICD-10-CM | POA: Diagnosis not present

## 2020-11-08 DIAGNOSIS — I4892 Unspecified atrial flutter: Secondary | ICD-10-CM | POA: Diagnosis not present

## 2020-11-08 DIAGNOSIS — Z7984 Long term (current) use of oral hypoglycemic drugs: Secondary | ICD-10-CM

## 2020-11-08 DIAGNOSIS — Z95828 Presence of other vascular implants and grafts: Secondary | ICD-10-CM

## 2020-11-08 DIAGNOSIS — K045 Chronic apical periodontitis: Secondary | ICD-10-CM | POA: Diagnosis present

## 2020-11-08 DIAGNOSIS — Z419 Encounter for procedure for purposes other than remedying health state, unspecified: Secondary | ICD-10-CM

## 2020-11-08 DIAGNOSIS — F1411 Cocaine abuse, in remission: Secondary | ICD-10-CM | POA: Diagnosis present

## 2020-11-08 DIAGNOSIS — I081 Rheumatic disorders of both mitral and tricuspid valves: Secondary | ICD-10-CM | POA: Diagnosis present

## 2020-11-08 DIAGNOSIS — Z8261 Family history of arthritis: Secondary | ICD-10-CM

## 2020-11-08 DIAGNOSIS — Z515 Encounter for palliative care: Secondary | ICD-10-CM | POA: Diagnosis not present

## 2020-11-08 DIAGNOSIS — M1990 Unspecified osteoarthritis, unspecified site: Secondary | ICD-10-CM | POA: Diagnosis present

## 2020-11-08 DIAGNOSIS — I480 Paroxysmal atrial fibrillation: Secondary | ICD-10-CM | POA: Diagnosis present

## 2020-11-08 DIAGNOSIS — J9601 Acute respiratory failure with hypoxia: Secondary | ICD-10-CM | POA: Diagnosis present

## 2020-11-08 DIAGNOSIS — E782 Mixed hyperlipidemia: Secondary | ICD-10-CM | POA: Diagnosis present

## 2020-11-08 DIAGNOSIS — I5043 Acute on chronic combined systolic (congestive) and diastolic (congestive) heart failure: Secondary | ICD-10-CM | POA: Diagnosis not present

## 2020-11-08 DIAGNOSIS — Z20822 Contact with and (suspected) exposure to covid-19: Secondary | ICD-10-CM | POA: Diagnosis present

## 2020-11-08 DIAGNOSIS — I272 Pulmonary hypertension, unspecified: Secondary | ICD-10-CM | POA: Diagnosis present

## 2020-11-08 DIAGNOSIS — Z9889 Other specified postprocedural states: Secondary | ICD-10-CM

## 2020-11-08 DIAGNOSIS — I361 Nonrheumatic tricuspid (valve) insufficiency: Secondary | ICD-10-CM

## 2020-11-08 DIAGNOSIS — I313 Pericardial effusion (noninflammatory): Secondary | ICD-10-CM | POA: Diagnosis present

## 2020-11-08 DIAGNOSIS — Z833 Family history of diabetes mellitus: Secondary | ICD-10-CM | POA: Diagnosis not present

## 2020-11-08 DIAGNOSIS — J449 Chronic obstructive pulmonary disease, unspecified: Secondary | ICD-10-CM | POA: Diagnosis present

## 2020-11-08 DIAGNOSIS — E440 Moderate protein-calorie malnutrition: Secondary | ICD-10-CM | POA: Diagnosis present

## 2020-11-08 DIAGNOSIS — I509 Heart failure, unspecified: Secondary | ICD-10-CM | POA: Diagnosis not present

## 2020-11-08 DIAGNOSIS — I110 Hypertensive heart disease with heart failure: Secondary | ICD-10-CM | POA: Diagnosis present

## 2020-11-08 DIAGNOSIS — R0789 Other chest pain: Secondary | ICD-10-CM | POA: Diagnosis present

## 2020-11-08 DIAGNOSIS — I5082 Biventricular heart failure: Secondary | ICD-10-CM | POA: Diagnosis present

## 2020-11-08 DIAGNOSIS — I5022 Chronic systolic (congestive) heart failure: Secondary | ICD-10-CM | POA: Diagnosis not present

## 2020-11-08 DIAGNOSIS — Z951 Presence of aortocoronary bypass graft: Secondary | ICD-10-CM | POA: Diagnosis not present

## 2020-11-08 DIAGNOSIS — Z87891 Personal history of nicotine dependence: Secondary | ICD-10-CM

## 2020-11-08 DIAGNOSIS — E119 Type 2 diabetes mellitus without complications: Secondary | ICD-10-CM | POA: Diagnosis not present

## 2020-11-08 DIAGNOSIS — I252 Old myocardial infarction: Secondary | ICD-10-CM

## 2020-11-08 DIAGNOSIS — Z7902 Long term (current) use of antithrombotics/antiplatelets: Secondary | ICD-10-CM

## 2020-11-08 DIAGNOSIS — R570 Cardiogenic shock: Secondary | ICD-10-CM | POA: Diagnosis present

## 2020-11-08 DIAGNOSIS — I5023 Acute on chronic systolic (congestive) heart failure: Secondary | ICD-10-CM | POA: Diagnosis present

## 2020-11-08 DIAGNOSIS — I5021 Acute systolic (congestive) heart failure: Secondary | ICD-10-CM | POA: Diagnosis not present

## 2020-11-08 DIAGNOSIS — I429 Cardiomyopathy, unspecified: Secondary | ICD-10-CM | POA: Diagnosis not present

## 2020-11-08 HISTORY — DX: Paroxysmal atrial fibrillation: I48.0

## 2020-11-08 LAB — CBC
HCT: 42 % (ref 39.0–52.0)
Hemoglobin: 14.3 g/dL (ref 13.0–17.0)
MCH: 28.7 pg (ref 26.0–34.0)
MCHC: 34 g/dL (ref 30.0–36.0)
MCV: 84.2 fL (ref 80.0–100.0)
Platelets: 219 10*3/uL (ref 150–400)
RBC: 4.99 MIL/uL (ref 4.22–5.81)
RDW: 14.4 % (ref 11.5–15.5)
WBC: 10.8 10*3/uL — ABNORMAL HIGH (ref 4.0–10.5)
nRBC: 0 % (ref 0.0–0.2)

## 2020-11-08 LAB — BASIC METABOLIC PANEL
Anion gap: 11 (ref 5–15)
BUN: 22 mg/dL — ABNORMAL HIGH (ref 6–20)
CO2: 24 mmol/L (ref 22–32)
Calcium: 9.4 mg/dL (ref 8.9–10.3)
Chloride: 92 mmol/L — ABNORMAL LOW (ref 98–111)
Creatinine, Ser: 0.83 mg/dL (ref 0.61–1.24)
GFR, Estimated: >60 mL/min (ref >60)
Glucose, Bld: 473 mg/dL — ABNORMAL HIGH (ref 70–99)
Potassium: 4.4 mmol/L (ref 3.5–5.1)
Sodium: 127 mmol/L — ABNORMAL LOW (ref 135–145)

## 2020-11-08 LAB — TROPONIN I (HIGH SENSITIVITY)
Troponin I (High Sensitivity): 42 ng/L — ABNORMAL HIGH (ref <18)
Troponin I (High Sensitivity): 39 ng/L — ABNORMAL HIGH (ref <18)

## 2020-11-08 LAB — GLUCOSE, CAPILLARY
Glucose-Capillary: 477 mg/dL — ABNORMAL HIGH (ref 70–99)
Glucose-Capillary: 491 mg/dL — ABNORMAL HIGH (ref 70–99)

## 2020-11-08 LAB — RESP PANEL BY RT-PCR (FLU A&B, COVID) ARPGX2
Influenza A by PCR: NEGATIVE
Influenza B by PCR: NEGATIVE
SARS Coronavirus 2 by RT PCR: NEGATIVE

## 2020-11-08 LAB — BRAIN NATRIURETIC PEPTIDE: B Natriuretic Peptide: 910 pg/mL — ABNORMAL HIGH (ref 0.0–100.0)

## 2020-11-08 MED ORDER — ROSUVASTATIN CALCIUM 20 MG PO TABS
40.0000 mg | ORAL_TABLET | Freq: Every day | ORAL | Status: DC
  Administered 2020-11-09 – 2020-11-16 (×7): 40 mg via ORAL
  Filled 2020-11-08 (×7): qty 2

## 2020-11-08 MED ORDER — INSULIN ASPART 100 UNIT/ML ~~LOC~~ SOLN
4.0000 [IU] | Freq: Three times a day (TID) | SUBCUTANEOUS | Status: DC
  Administered 2020-11-09 – 2020-11-15 (×15): 4 [IU] via SUBCUTANEOUS

## 2020-11-08 MED ORDER — MOMETASONE FURO-FORMOTEROL FUM 200-5 MCG/ACT IN AERO
2.0000 | INHALATION_SPRAY | Freq: Two times a day (BID) | RESPIRATORY_TRACT | Status: DC
  Administered 2020-11-08 – 2020-11-28 (×36): 2 via RESPIRATORY_TRACT
  Filled 2020-11-08 (×2): qty 8.8

## 2020-11-08 MED ORDER — AMIODARONE HCL IN DEXTROSE 360-4.14 MG/200ML-% IV SOLN
60.0000 mg/h | INTRAVENOUS | Status: DC
  Administered 2020-11-08 (×2): 60 mg/h via INTRAVENOUS
  Filled 2020-11-08 (×2): qty 200

## 2020-11-08 MED ORDER — NITROGLYCERIN IN D5W 200-5 MCG/ML-% IV SOLN
2.0000 ug/min | INTRAVENOUS | Status: DC
  Administered 2020-11-09: 5 ug/min via INTRAVENOUS
  Administered 2020-11-09: 10 ug/min via INTRAVENOUS
  Administered 2020-11-09: 5 ug/min via INTRAVENOUS
  Filled 2020-11-08: qty 250

## 2020-11-08 MED ORDER — INSULIN GLARGINE 100 UNIT/ML ~~LOC~~ SOLN
20.0000 [IU] | Freq: Every day | SUBCUTANEOUS | Status: DC
  Administered 2020-11-08 – 2020-11-15 (×8): 20 [IU] via SUBCUTANEOUS
  Filled 2020-11-08 (×11): qty 0.2

## 2020-11-08 MED ORDER — LORAZEPAM 1 MG PO TABS
1.0000 mg | ORAL_TABLET | Freq: Once | ORAL | Status: AC
  Administered 2020-11-09: 1 mg via ORAL
  Filled 2020-11-08: qty 1

## 2020-11-08 MED ORDER — INSULIN ASPART 100 UNIT/ML ~~LOC~~ SOLN
0.0000 [IU] | Freq: Every day | SUBCUTANEOUS | Status: DC
  Administered 2020-11-08: 21:00:00 5 [IU] via SUBCUTANEOUS
  Administered 2020-11-09 – 2020-11-11 (×3): 2 [IU] via SUBCUTANEOUS
  Administered 2020-11-13 – 2020-11-14 (×2): 4 [IU] via SUBCUTANEOUS
  Administered 2020-11-15: 2 [IU] via SUBCUTANEOUS
  Administered 2020-11-16: 22:00:00 3 [IU] via SUBCUTANEOUS

## 2020-11-08 MED ORDER — SACUBITRIL-VALSARTAN 24-26 MG PO TABS
1.0000 | ORAL_TABLET | Freq: Two times a day (BID) | ORAL | Status: DC
  Administered 2020-11-08: 1 via ORAL
  Filled 2020-11-08: qty 1

## 2020-11-08 MED ORDER — HEPARIN (PORCINE) 25000 UT/250ML-% IV SOLN
1100.0000 [IU]/h | INTRAVENOUS | Status: DC
  Administered 2020-11-08: 900 [IU]/h via INTRAVENOUS
  Filled 2020-11-08 (×2): qty 250

## 2020-11-08 MED ORDER — INSULIN ASPART 100 UNIT/ML ~~LOC~~ SOLN
0.0000 [IU] | Freq: Three times a day (TID) | SUBCUTANEOUS | Status: DC
  Administered 2020-11-09: 3 [IU] via SUBCUTANEOUS
  Administered 2020-11-09: 09:00:00 5 [IU] via SUBCUTANEOUS
  Administered 2020-11-10: 09:00:00 2 [IU] via SUBCUTANEOUS
  Administered 2020-11-10 – 2020-11-11 (×2): 5 [IU] via SUBCUTANEOUS
  Administered 2020-11-11: 8 [IU] via SUBCUTANEOUS
  Administered 2020-11-11: 08:00:00 5 [IU] via SUBCUTANEOUS
  Administered 2020-11-12: 2 [IU] via SUBCUTANEOUS
  Administered 2020-11-12: 12:00:00 5 [IU] via SUBCUTANEOUS
  Administered 2020-11-12: 17:00:00 8 [IU] via SUBCUTANEOUS
  Administered 2020-11-13 (×2): 3 [IU] via SUBCUTANEOUS
  Administered 2020-11-13: 17:00:00 0 [IU] via SUBCUTANEOUS
  Administered 2020-11-14 (×2): 5 [IU] via SUBCUTANEOUS
  Administered 2020-11-14: 12:00:00 8 [IU] via SUBCUTANEOUS
  Administered 2020-11-15: 5 [IU] via SUBCUTANEOUS
  Administered 2020-11-15: 11 [IU] via SUBCUTANEOUS
  Administered 2020-11-15: 07:00:00 8 [IU] via SUBCUTANEOUS
  Administered 2020-11-16: 06:00:00 5 [IU] via SUBCUTANEOUS
  Administered 2020-11-16: 15 [IU] via SUBCUTANEOUS

## 2020-11-08 MED ORDER — TRAZODONE HCL 50 MG PO TABS
50.0000 mg | ORAL_TABLET | Freq: Every day | ORAL | Status: DC
  Administered 2020-11-08 – 2020-11-16 (×9): 50 mg via ORAL
  Filled 2020-11-08 (×9): qty 1

## 2020-11-08 MED ORDER — ASPIRIN EC 81 MG PO TBEC
81.0000 mg | DELAYED_RELEASE_TABLET | Freq: Every day | ORAL | Status: DC
  Administered 2020-11-09 – 2020-11-16 (×8): 81 mg via ORAL
  Filled 2020-11-08 (×8): qty 1

## 2020-11-08 MED ORDER — NITROGLYCERIN 2 % TD OINT
0.5000 [in_us] | TOPICAL_OINTMENT | Freq: Once | TRANSDERMAL | Status: AC
  Administered 2020-11-08: 0.5 [in_us] via TOPICAL
  Filled 2020-11-08: qty 1

## 2020-11-08 MED ORDER — ALBUTEROL SULFATE (2.5 MG/3ML) 0.083% IN NEBU
2.5000 mg | INHALATION_SOLUTION | Freq: Four times a day (QID) | RESPIRATORY_TRACT | Status: DC | PRN
  Administered 2020-11-08 – 2020-11-09 (×2): 2.5 mg via RESPIRATORY_TRACT
  Filled 2020-11-08: qty 3

## 2020-11-08 MED ORDER — AMIODARONE LOAD VIA INFUSION
150.0000 mg | Freq: Once | INTRAVENOUS | Status: AC
  Administered 2020-11-08: 150 mg via INTRAVENOUS
  Filled 2020-11-08: qty 83.34

## 2020-11-08 MED ORDER — ASPIRIN 81 MG PO CHEW
324.0000 mg | CHEWABLE_TABLET | Freq: Once | ORAL | Status: AC
  Administered 2020-11-08: 324 mg via ORAL
  Filled 2020-11-08: qty 4

## 2020-11-08 MED ORDER — FUROSEMIDE 10 MG/ML IJ SOLN
40.0000 mg | Freq: Two times a day (BID) | INTRAMUSCULAR | Status: DC
  Administered 2020-11-08 – 2020-11-09 (×2): 40 mg via INTRAVENOUS
  Filled 2020-11-08 (×2): qty 4

## 2020-11-08 MED ORDER — PANTOPRAZOLE SODIUM 40 MG PO TBEC
40.0000 mg | DELAYED_RELEASE_TABLET | Freq: Every day | ORAL | Status: DC
  Administered 2020-11-08 – 2020-11-16 (×9): 40 mg via ORAL
  Filled 2020-11-08 (×9): qty 1

## 2020-11-08 MED ORDER — MAGNESIUM OXIDE 400 (241.3 MG) MG PO TABS
400.0000 mg | ORAL_TABLET | Freq: Every day | ORAL | Status: DC
  Administered 2020-11-08 – 2020-11-16 (×9): 400 mg via ORAL
  Filled 2020-11-08 (×9): qty 1

## 2020-11-08 MED ORDER — SODIUM CHLORIDE 0.9 % IV BOLUS: 500.0000 mL | Freq: Once | INTRAVENOUS | Status: DC

## 2020-11-08 MED ORDER — SPIRONOLACTONE 12.5 MG HALF TABLET
12.5000 mg | ORAL_TABLET | Freq: Every day | ORAL | Status: DC
  Administered 2020-11-08 – 2020-11-09 (×2): 12.5 mg via ORAL
  Filled 2020-11-08 (×2): qty 1

## 2020-11-08 MED ORDER — NITROGLYCERIN 0.4 MG SL SUBL
0.4000 mg | SUBLINGUAL_TABLET | SUBLINGUAL | Status: DC | PRN
  Administered 2020-11-08 (×3): 0.4 mg via SUBLINGUAL
  Filled 2020-11-08 (×2): qty 1

## 2020-11-08 MED ORDER — NITROGLYCERIN 0.4 MG SL SUBL: 0.4000 mg | SUBLINGUAL_TABLET | SUBLINGUAL | Status: DC | PRN

## 2020-11-08 MED ORDER — CARVEDILOL 3.125 MG PO TABS
3.1250 mg | ORAL_TABLET | Freq: Two times a day (BID) | ORAL | Status: DC
  Administered 2020-11-08 – 2020-11-09 (×2): 3.125 mg via ORAL
  Filled 2020-11-08 (×2): qty 1

## 2020-11-08 MED ORDER — GABAPENTIN 600 MG PO TABS
600.0000 mg | ORAL_TABLET | Freq: Three times a day (TID) | ORAL | Status: DC
  Administered 2020-11-08 – 2020-11-16 (×23): 600 mg via ORAL
  Filled 2020-11-08 (×23): qty 1

## 2020-11-08 MED ORDER — INSULIN ASPART 100 UNIT/ML ~~LOC~~ SOLN: 0.0000 [IU] | Freq: Three times a day (TID) | SUBCUTANEOUS | Status: DC

## 2020-11-08 MED ORDER — ONDANSETRON HCL 4 MG/2ML IJ SOLN: 4.0000 mg | Freq: Four times a day (QID) | INTRAMUSCULAR | Status: DC | PRN

## 2020-11-08 MED ORDER — CLOPIDOGREL BISULFATE 75 MG PO TABS
75.0000 mg | ORAL_TABLET | Freq: Every day | ORAL | Status: DC
  Administered 2020-11-08 – 2020-11-09 (×2): 75 mg via ORAL
  Filled 2020-11-08 (×2): qty 1

## 2020-11-08 MED ORDER — ACETAMINOPHEN 325 MG PO TABS
650.0000 mg | ORAL_TABLET | ORAL | Status: DC | PRN
  Administered 2020-11-08: 650 mg via ORAL
  Filled 2020-11-08: qty 2

## 2020-11-08 MED ORDER — HEPARIN BOLUS VIA INFUSION
4000.0000 [IU] | Freq: Once | INTRAVENOUS | Status: AC
  Administered 2020-11-08: 4000 [IU] via INTRAVENOUS

## 2020-11-08 MED ORDER — DIGOXIN 125 MCG PO TABS
0.1250 mg | ORAL_TABLET | Freq: Every day | ORAL | Status: DC
  Administered 2020-11-08 – 2020-11-16 (×9): 0.125 mg via ORAL
  Filled 2020-11-08 (×9): qty 1

## 2020-11-08 MED ORDER — AMIODARONE HCL IN DEXTROSE 360-4.14 MG/200ML-% IV SOLN
30.0000 mg/h | INTRAVENOUS | Status: DC
  Administered 2020-11-08 – 2020-11-22 (×28): 30 mg/h via INTRAVENOUS
  Filled 2020-11-08 (×27): qty 200

## 2020-11-08 MED ORDER — INSULIN ASPART 100 UNIT/ML ~~LOC~~ SOLN
10.0000 [IU] | Freq: Once | SUBCUTANEOUS | Status: AC
  Administered 2020-11-08: 10 [IU] via SUBCUTANEOUS

## 2020-11-08 NOTE — ED Notes (Signed)
Kevin Underwood Sister [phone]

## 2020-11-08 NOTE — ED Notes (Signed)
Pt denies chest pain at this time. Pt states pain is better but he is still short of breath. Pt O2 sats currently 96% on RA.

## 2020-11-08 NOTE — ED Triage Notes (Signed)
Pt reports sob and chest tightness since yesterday.  Reports had heart surgery last Jan.

## 2020-11-08 NOTE — H&P (Signed)
Cardiology Admission History and Physical:   Patient ID: Kevin Underwood; [id number]; December 07, 1961   Admission date: 11/08/2020  Primary Care Provider: Monico Blitz, MD Primary Cardiologist: Rozann Lesches, MD Primary Electrophysiologist: None  Chief Complaint: Chest pain and shortness of breath  Patient Profile:   Kevin Underwood is a 59 y.o. male with a history of multivessel CAD status post CABG in December 2020 at which point he required Impella support due to cardiogenic shock and course of milrinone with cardiomyopathy and LVEF less than 20%, paroxysmal atrial fibrillation with RVR during assessment in December 2020 status post maze with surgery and left atrial clipping (previously on amiodarone but not anticoagulated), mixed pulmonary venous/arterial hypertension, type 2 diabetes mellitus, COPD, and hypertension now presenting with recent onset shortness of breath and chest tightness.  History of Present Illness:   [name] presents to the [facility] ER reporting onset of significant shortness of breath yesterday and chest tightness, occurred by simply walking around in his house, going to the bathroom.  Prior to this he had NYHA class II-III shortness of breath on medical therapy.  He states that he has come been compliant with his medications, did not take his doses this morning.  I am listed as his primary cardiologist, saw him in consultation back in December 2020 prior to all of the events described above, have not seen him in the office.  Most recent visits have been with Mr. Leonides Sake NP in [place]. I see that he had a follow-up echocardiogram done in October at which point his LVEF was 20 to 25% with global hypokinesis, mild RV dysfunction, moderate biatrial enlargement, moderate mitral regurgitation and tricuspid regurgitation.  He has not had follow-up in the heart failure clinic other than a telehealth encounter back in January.  No EP consultation for discussion of ICD as  yet.  He states that his weight fluctuates between 165 and 170 pounds.  He has had orthopnea, no leg swelling, reports increased sense of abdominal girth.  Reports poor appetite.  Rhythm at presentation is sinus with very frequent atrial ectopy, cannot exclude brief bursts of atrial fibrillation.  He is noted to be hyponatremic with sodium 127, creatinine 0.82, BNP 910 with high-sensitivity troponin I 42.  Chest x-ray shows interstitial edema.   Past Medical History:  Diagnosis Date  . Arthritis   . CAD (coronary artery disease)    a. s/p CABG in 11/2019 with LIMA-LAD, SVG-OM1, SVG-PDA and SVG-D1  . CHF (congestive heart failure) ([place])    a. EF < 20% by echo in 11/2019  . COPD (chronic obstructive pulmonary disease) ([place])   . Essential hypertension   . PAF (paroxysmal atrial fibrillation) ([place])   . Type 2 diabetes mellitus ([place])     Past Surgical History:  Procedure Laterality Date  . BACK SURGERY    . CLIPPING OF ATRIAL APPENDAGE N/A 11/26/2019   Procedure: Clipping Of Atrial Appendage using AtriCure 40 Clip;  Surgeon: Wonda Olds, MD;  [place]  Service: Open Heart Surgery;  Laterality: N/A;  . CORONARY ARTERY BYPASS GRAFT N/A 11/26/2019   Procedure: CORONARY ARTERY BYPASS GRAFTING (CABG) using endoscopic greater saphenous vein harvest: svc to OM; svc to Diag; svc to PD; and LIMA to LAD.;  Surgeon: Wonda Olds, MD;  Location: [place];  Service: Open Heart Surgery;  Laterality: N/A;  . FOOT SURGERY    . HAND SURGERY    . INTRAOPERATIVE TRANSESOPHAGEAL ECHOCARDIOGRAM  12/04/2019   Procedure: Intraoperative  Transesophageal Echocardiogram;  Surgeon: Wonda Olds, MD;  Location[facility];  Service: Open Heart Surgery;;  . MAZE N/A 11/26/2019   Procedure: MAZE using Bilateral Pulmonary Vein isolation.;  Surgeon: Wonda Olds, MD;  Location: [place];  Service: Open Heart Surgery;  Laterality: N/A;  . PLACEMENT OF IMPELLA LEFT VENTRICULAR ASSIST DEVICE N/A 11/24/2019    Procedure: PLACEMENT OF IMPELLA 5.5 LEFT VENTRICULAR ASSIST DEVICE;  Surgeon: Wonda Olds, MD;  Location: [place];  Service: Open Heart Surgery;  Laterality: N/A;  . REMOVAL OF IMPELLA LEFT VENTRICULAR ASSIST DEVICE Right 12/04/2019   Procedure: REMOVAL OF IMPELLA LEFT VENTRICULAR ASSIST DEVICE, right axilla;  Surgeon: Wonda Olds, MD;  Location: [place];  Service: Open Heart Surgery;  Laterality: Right;  . RIGHT/LEFT HEART CATH AND CORONARY ANGIOGRAPHY N/A 11/24/2019   Procedure: RIGHT/LEFT HEART CATH AND CORONARY ANGIOGRAPHY;  Surgeon: Larey Dresser, MD;  Location: [place] CV [facility];  Service: Cardiovascular;  Laterality: N/A;  . TEE WITHOUT CARDIOVERSION N/A 11/24/2019   Procedure: TRANSESOPHAGEAL ECHOCARDIOGRAM (TEE);  Surgeon: Wonda Olds, MD;  Location: [place];  Service: Open Heart Surgery;  Laterality: N/A;  . TEE WITHOUT CARDIOVERSION N/A 11/26/2019   Procedure: TRANSESOPHAGEAL ECHOCARDIOGRAM (TEE);  Surgeon: Wonda Olds, MD;  Location: [place];  Service: Open Heart Surgery;  Laterality: N/A;     Medications Prior to Admission: Prior to Admission medications   Medication Sig Start Date End Date Taking? Authorizing Provider  albuterol (PROVENTIL) (2.5 MG/3ML) 0.083% nebulizer solution Take 2.5 mg by nebulization every 6 (six) hours as needed for wheezing or shortness of breath.  08/25/20   [provider]  blood glucose meter kit and supplies KIT Dispense based on patient and insurance preference. Use to check CBG's three times a day. (FOR ICD-9 250.00, 250.01). 09/25/20   Barton Dubois, MD  carvedilol (COREG) 6.25 MG tablet TAKE 1 TABLET(6.25 MG) BY MOUTH TWICE DAILY WITH A MEAL 09/29/20   Verta Ellen., NP  clopidogrel (PLAVIX) 75 MG tablet TAKE 1 TABLET(75 MG) BY MOUTH DAILY 09/29/20   Verta Ellen., NP  COMBIVENT RESPIMAT 20-100 MCG/ACT AERS respimat Inhale 1 puff into the lungs every 6 (six) hours as needed. 12/08/19   John Giovanni, PA-C   dapagliflozin propanediol (FARXIGA) 5 MG TABS tablet Take 1 tablet (5 mg total) by mouth daily before breakfast. 09/25/20   Barton Dubois, MD  digoxin (LANOXIN) 0.125 MG tablet TAKE 1 TABLET(0.125 MG) BY MOUTH DAILY 09/29/20   Verta Ellen., NP  ENTRESTO 24-26 MG TAKE 1 TABLET BY MOUTH TWICE DAILY 09/29/20   Verta Ellen., NP  furosemide (LASIX) 80 MG tablet Take 0.5-1 tablets (40-80 mg total) by mouth See admin instructions. 80mg  in the am 40mg  in the evening 09/25/20 09/25/21  Barton Dubois, MD  gabapentin (NEURONTIN) 600 MG tablet Take 1 tablet (600 mg total) by mouth 3 (three) times daily. 09/25/20   Barton Dubois, MD  magnesium oxide (MAG-OX) 400 (241.3 Mg) MG tablet Take 1 tablet (400 mg total) by mouth daily. 09/25/20   Barton Dubois, MD  metFORMIN (GLUCOPHAGE) 500 MG tablet Take 2 tablets (1,000 mg total) by mouth 2 (two) times daily with a meal. 09/25/20   Barton Dubois, MD  mometasone-formoterol [facility]) 200-5 MCG/ACT AERO Inhale 2 puffs into the lungs in the morning and at bedtime. 09/25/20   Barton Dubois, MD  Multiple Vitamin (MULTIVITAMIN WITH MINERALS) TABS tablet Take 1 tablet by mouth daily.  12/09/19   Gold, Wayne E, PA-C  pantoprazole (PROTONIX) 40 MG tablet Take 1 tablet (40 mg total) by mouth daily. 09/26/20   Barton Dubois, MD  potassium chloride SA (KLOR-CON) 20 MEQ tablet Take 2 tablets (40 mEq total) by mouth 2 (two) times daily. 09/25/20   Barton Dubois, MD  rosuvastatin (CRESTOR) 40 MG tablet Take 1 tablet (40 mg total) by mouth daily. 09/25/20   Barton Dubois, MD  spironolactone (ALDACTONE) 25 MG tablet Take 0.5 tablets (12.5 mg total) by mouth daily. 10/12/20   Verta Ellen., NP  traZODone (DESYREL) 50 MG tablet Take 50 mg by mouth at bedtime.    [provider]     Allergies:   No Known Allergies  Social History:   Social History   Tobacco Use  . Smoking status: Former Smoker    Packs/day: 1.00    Years: 30.00    Pack years:  30.00    Types: Cigarettes    Quit date: 12/20/2019    Years since quitting: 0.8  . Smokeless tobacco: Never Used  Substance Use Topics  . Alcohol use: Yes    Comment: Prior history of excessive intake    Family History:  The patient's family history includes Arthritis in an other family member; Asthma in an other family member; Diabetes in an other family member; Heart disease in his sister; Lung disease in an other family member.    ROS:  Please see the history of present illness.  All other ROS reviewed and negative.     Physical Exam/Data:   Vitals:   11/08/20 1439 11/08/20 1500 11/08/20 1530 11/08/20 1603  BP: (!) 121/97 (!) 114/94 (!) 116/91 100/76  Pulse: (!) 108 (!) 59 (!) 115 83  Resp: (!) 23 (!) $Remo'22 17 17  'RsoFN$ Temp:      TempSrc:      SpO2: 98% 98% 94% 96%  Weight:      Height:       No intake or output data in the 24 hours ending 11/08/20 1631 Filed Weights   11/08/20 1308  Weight: 77.1 kg   Body mass index is 24.39 kg/m.   Gen: Short of breath.  No acute distress. HEENT: Conjunctiva and lids normal, oropharynx clear. Neck: Supple, elevated JVP, no thyromegaly. Lungs: Few scattered crackles, expiratory wheeze. Cardiac: Indistinct PMI.  Irregular, no S3, soft apical systolic murmur, no pericardial rub. Abdomen: Protuberant, bowel sounds present. Extremities: No pitting edema. Skin: Warm and dry. Musculoskeletal: No kyphosis. Neuropsychiatric: Alert and oriented x3, affect grossly appropriate.  EKG:  An ECG dated 11/08/2020 was personally reviewed today and demonstrated:  Sinus tachycardia with frequent PACs, old anterior infarct pattern, diffuse nonspecific ST-T wave changes.  Relevant CV Studies:  Echocardiogram 09/21/2020: 1. Left ventricular ejection fraction, by estimation, is 20 to 25%. The  left ventricle has severely decreased function. The left ventricle  demonstrates global hypokinesis. The left ventricular internal cavity size  was mildly  dilated. Left ventricular  diastolic parameters are indeterminate.  2. Right ventricular systolic function is mildly reduced. The right  ventricular size is mildly enlarged.  3. Left atrial size was moderately dilated.  4. Right atrial size was moderately dilated.  5. The pericardial effusion is circumferential.  6. The mitral valve is abnormal. Moderate mitral valve regurgitation. No  evidence of mitral stenosis.  7. The tricuspid valve is abnormal. Tricuspid valve regurgitation is  moderate.  8. The aortic valve is tricuspid. Aortic valve regurgitation is not  visualized.  No aortic stenosis is present.  9. The inferior vena cava is normal in size with greater than 50%  respiratory variability, suggesting right atrial pressure of 3 mmHg.   Laboratory Data:  Chemistry Recent Labs  Lab 11/08/20 1344  NA 127*  K 4.4  CL 92*  CO2 24  GLUCOSE 473*  BUN 22*  CREATININE 0.83  CALCIUM 9.4  GFRNONAA >60  ANIONGAP 11    Hematology Recent Labs  Lab 11/08/20 1344  WBC 10.8*  RBC 4.99  HGB 14.3  HCT 42.0  MCV 84.2  MCH 28.7  MCHC 34.0  RDW 14.4  PLT 219   Cardiac Enzymes Recent Labs  Lab 11/08/20 1344 11/08/20 1524  TROPONINIHS 42* 39*   BNP Recent Labs  Lab 11/08/20 1344  BNP 910.0*     Radiology/Studies:  DG Chest 2 View  Result Date: 11/08/2020 CLINICAL DATA:  Shortness of breath. EXAM: CHEST - 2 VIEW COMPARISON:  September 22, 2020. FINDINGS: Stable cardiomediastinal silhouette. Status post coronary bypass graft. No pneumothorax or significant pleural effusion is noted. Mild interstitial densities are noted in both lung bases concerning for possible pulmonary edema. Bony thorax is unremarkable. IMPRESSION: Mild bibasilar interstitial densities are noted concerning for possible pulmonary edema. Aortic Atherosclerosis (ICD10-I70.0). Electronically Signed   By: Marijo Conception M.D.   On: 11/08/2020 13:44    Assessment and Plan:   1.  Unstable angina,  patient presents with recent onset shortness of breath with minimal activity and chest tightness since yesterday.  Prior to this reporting NYHA class II-III dyspnea and no regular angina symptoms on medical therapy.  Initial high-sensitivity troponin I is only 42.  ECG shows nonspecific ST-T changes with old anterior infarct pattern.  2.  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  He underwent revascularization with CABG last year at which point he had cardiogenic shock requiring Impella and milrinone temporarily.  He has not maintained regular follow-up in the heart failure clinic.  No EP consultation as yet regarding ICD.  3.  Moderate mitral and tricuspid regurgitation.  RVSP estimated 46 mmHg consistent with moderate pulmonary hypertension.  4.  History of atrial fibrillation, rhythm is sinus with frequent PACs now, cannot exclude brief bursts of atrial fibrillation.  He has not been anticoagulated, underwent left atrial appendage clipping with CABG.  Previously on amiodarone.  5.  Type 2 diabetes mellitus.  He is on [place] as an outpatient.  6.  Mixed hyperlipidemia, on Crestor.  I reviewed patient history and discussed current situation with patient.  Recommendation is to have him transferred to our cardiology service at [facility] for involvement of the Advanced Heart Failure team and most likely a left and right heart catheterization to guide further treatment options.  For the time being he will be started on IV amiodarone to settle down rhythm as he did not tolerate atrial fibrillation well in the past, start IV heparin.  Start IV Lasix and continue Lanoxin, Entresto if blood pressure continues to tolerate, and Aldactone.  Reduce Coreg to 3.125 mg daily, may need to discontinue if his cardiac output/index is reduced.  If bed does not become available within the next 12 hours, please check back in with our service for update on patient condition as he  may need to be temporarily admitted to [facility] or transferred ER to ER depending on the situation.  Signed, Rozann Lesches, MD  11/08/2020 4:31 PM

## 2020-11-08 NOTE — ED Provider Notes (Signed)
[facility] EMERGENCY DEPARTMENT Provider Note   CSN: [id number] Arrival date & time: 11/08/20  1251     History Chief Complaint  Patient presents with  . Chest Pain    Kevin Underwood is a 59 y.o. male with a past medical history of CAD, cardiomyopathy, CHF, hypertension, hyperlipidemia.  He has a history of alcohol and cocaine abuse but has been free of all substance abuse for the past year.  He no longer smokes cigarettes.  Last year in December he was admitted with heart failure and A. fib and had a cardiac arrest.  He is status post quadruple bypass and status post LVAD. He states that yesterday around 4 PM he got up to go the bathroom and became extremely short of breath.  He states that he can even get to the bathroom.  He had severe tightness on his chest.  He states that he has been like that all night.  Around 4 AM he got very nauseated but did not vomit.  This morning he called his sister to pick him up and went over to his mother's house where he stayed until he came to the emergency department.  He has persistent shortness of breath and chest tightness.  He denies diaphoresis, leg swelling or weight gain.  He is unsure of what medications he takes.  He denies any recent fevers, chills, cough, urinary symptoms, vomiting or diarrhea.  He is not vaccinated against coronavirus.-  HPI     Past Medical History:  Diagnosis Date  . Arthritis   . CAD (coronary artery disease)    a. s/p CABG in 11/2019 with LIMA-LAD, SVG-OM1, SVG-PDA and SVG-D1  . CHF (congestive heart failure) ([place])    a. EF < 20% by echo in 11/2019  . COPD (chronic obstructive pulmonary disease) ([place])   . Essential hypertension   . Type 2 diabetes mellitus [facility])     Patient Active Problem List   Diagnosis Date Noted  . Hyperlipidemia   . CHF exacerbation ([place]) 09/23/2020  . Unstable angina ([place]) 09/22/2020  . Chest tube in place   . 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 Cardiogenic shock ([place])   . Acute  respiratory failure ([place])   . RUQ abdominal pain   . Cardiomyopathy ([place]) 11/21/2019  . Acute systolic CHF (congestive heart failure) ([place]) 11/21/2019  . History of alcohol abuse Quit 6 mos ago 11/21/2019  . History of recreational drug use 11/21/2019  . PAF (paroxysmal atrial fibrillation) ([place]) 11/21/2019  . Cardiac volume overload 11/21/2019  . Elevated brain natriuretic peptide (BNP) level 11/21/2019  . Leg edema, right 11/21/2019  . Hyponatremia 11/21/2019  . Abnormal electrocardiogram (ECG) (EKG) 11/21/2019  . Hyperglycemia 11/21/2019  . Tremulousness 11/21/2019  . Restlessness and agitation 11/21/2019  . OSA (obstructive sleep apnea) 11/21/2019  . Atrial fibrillation with RVR ([place]) 11/20/2019  . Acute congestive heart failure ([place]) 11/20/2019  . Acute respiratory distress 11/20/2019  . COPD with acute exacerbation ([place])   . Hypertension   . Type 2 diabetes mellitus ([place])   . Closed fracture of 5th metacarpal 08/31/2012    Past Surgical History:  Procedure Laterality Date  . BACK SURGERY    . CLIPPING OF ATRIAL APPENDAGE N/A 11/26/2019   Procedure: Clipping Of Atrial Appendage using AtriCure 40 Clip;  Surgeon: Wonda Olds, MD;  Location: [place];  Service: Open Heart Surgery;  Laterality: N/A;  . CORONARY ARTERY BYPASS GRAFT N/A 11/26/2019   Procedure: CORONARY ARTERY BYPASS  GRAFTING (CABG) using endoscopic greater saphenous vein harvest: svc to OM; svc to Diag; svc to PD; and LIMA to LAD.;  Surgeon: Wonda Olds, MD;  Location: [place];  Service: Open Heart Surgery;  Laterality: N/A;  . FOOT SURGERY    . HAND SURGERY    . INTRAOPERATIVE TRANSESOPHAGEAL ECHOCARDIOGRAM  12/04/2019   Procedure: Intraoperative Transesophageal Echocardiogram;  Surgeon: Wonda Olds, MD;  Location: [facility] OR;  Service: Open Heart Surgery;;  . MAZE N/A 11/26/2019   Procedure: MAZE using Bilateral Pulmonary Vein isolation.;  Surgeon: Wonda Olds, MD;  Location: [place];  Service: Open  Heart Surgery;  Laterality: N/A;  . PLACEMENT OF IMPELLA LEFT VENTRICULAR ASSIST DEVICE N/A 11/24/2019   Procedure: PLACEMENT OF IMPELLA 5.5 LEFT VENTRICULAR ASSIST DEVICE;  Surgeon: Wonda Olds, MD;  Location: [place];  Service: Open Heart Surgery;  Laterality: N/A;  . REMOVAL OF IMPELLA LEFT VENTRICULAR ASSIST DEVICE Right 12/04/2019   Procedure: REMOVAL OF IMPELLA LEFT VENTRICULAR ASSIST DEVICE, right axilla;  Surgeon: Wonda Olds, MD;  Location: [place];  Service: Open Heart Surgery;  Laterality: Right;  . RIGHT/LEFT HEART CATH AND CORONARY ANGIOGRAPHY N/A 11/24/2019   Procedure: RIGHT/LEFT HEART CATH AND CORONARY ANGIOGRAPHY;  Surgeon: Larey Dresser, MD;  Location: [place] CV LAB;  Service: Cardiovascular;  Laterality: N/A;  . TEE WITHOUT CARDIOVERSION N/A 11/24/2019   Procedure: TRANSESOPHAGEAL ECHOCARDIOGRAM (TEE);  Surgeon: Wonda Olds, MD;  Location: [place];  Service: Open Heart Surgery;  Laterality: N/A;  . TEE WITHOUT CARDIOVERSION N/A 11/26/2019   Procedure: TRANSESOPHAGEAL ECHOCARDIOGRAM (TEE);  Surgeon: Wonda Olds, MD;  Location: [place];  Service: Open Heart Surgery;  Laterality: N/A;       Family History  Problem Relation Age of Onset  . Arthritis Other   . Lung disease Other   . Asthma Other   . Diabetes Other   . Heart disease Sister        Tumor?    Social History   Tobacco Use  . Smoking status: Former Smoker    Packs/day: 1.00    Years: 30.00    Pack years: 30.00    Types: Cigarettes    Quit date: 12/20/2019    Years since quitting: 0.8  . Smokeless tobacco: Never Used  Vaping Use  . Vaping Use: Never used  Substance Use Topics  . Alcohol use: Yes    Comment: Prior history of excessive intake  . Drug use: No    Home Medications Prior to Admission medications   Medication Sig Start Date End Date Taking? Authorizing Provider  albuterol (PROVENTIL) (2.5 MG/3ML) 0.083% nebulizer solution Take 2.5 mg by nebulization every 6 (six)  hours as needed for wheezing or shortness of breath.  08/25/20   [provider]  blood glucose meter kit and supplies KIT Dispense based on patient and insurance preference. Use to check CBG's three times a day. (FOR ICD-9 250.00, 250.01). 09/25/20   Barton Dubois, MD  carvedilol (COREG) 6.25 MG tablet TAKE 1 TABLET(6.25 MG) BY MOUTH TWICE DAILY WITH A MEAL 09/29/20   Verta Ellen., NP  clopidogrel (PLAVIX) 75 MG tablet TAKE 1 TABLET(75 MG) BY MOUTH DAILY 09/29/20   Verta Ellen., NP  COMBIVENT RESPIMAT 20-100 MCG/ACT AERS respimat Inhale 1 puff into the lungs every 6 (six) hours as needed. 12/08/19   Jadene Pierini E, PA-C  dapagliflozin propanediol (FARXIGA) 5 MG TABS tablet Take 1 tablet (5 mg total) by  mouth daily before breakfast. 09/25/20   Barton Dubois, MD  digoxin (LANOXIN) 0.125 MG tablet TAKE 1 TABLET(0.125 MG) BY MOUTH DAILY 09/29/20   Verta Ellen., NP  ENTRESTO 24-26 MG TAKE 1 TABLET BY MOUTH TWICE DAILY 09/29/20   Verta Ellen., NP  furosemide (LASIX) 80 MG tablet Take 0.5-1 tablets (40-80 mg total) by mouth See admin instructions. 80mg  in the am 40mg  in the evening 09/25/20 09/25/21  Barton Dubois, MD  gabapentin (NEURONTIN) 600 MG tablet Take 1 tablet (600 mg total) by mouth 3 (three) times daily. 09/25/20   Barton Dubois, MD  magnesium oxide (MAG-OX) 400 (241.3 Mg) MG tablet Take 1 tablet (400 mg total) by mouth daily. 09/25/20   Barton Dubois, MD  metFORMIN (GLUCOPHAGE) 500 MG tablet Take 2 tablets (1,000 mg total) by mouth 2 (two) times daily with a meal. 09/25/20   Barton Dubois, MD  mometasone-formoterol [facility]) 200-5 MCG/ACT AERO Inhale 2 puffs into the lungs in the morning and at bedtime. 09/25/20   Barton Dubois, MD  Multiple Vitamin (MULTIVITAMIN WITH MINERALS) TABS tablet Take 1 tablet by mouth daily. 12/09/19   Gold, Wayne E, PA-C  pantoprazole (PROTONIX) 40 MG tablet Take 1 tablet (40 mg total) by mouth daily. 09/26/20   Barton Dubois,  MD  potassium chloride SA (KLOR-CON) 20 MEQ tablet Take 2 tablets (40 mEq total) by mouth 2 (two) times daily. 09/25/20   Barton Dubois, MD  rosuvastatin (CRESTOR) 40 MG tablet Take 1 tablet (40 mg total) by mouth daily. 09/25/20   Barton Dubois, MD  spironolactone (ALDACTONE) 25 MG tablet Take 0.5 tablets (12.5 mg total) by mouth daily. 10/12/20   Verta Ellen., NP  traZODone (DESYREL) 50 MG tablet Take 50 mg by mouth at bedtime.    [provider]    Allergies    Patient has no known allergies.  Review of Systems   Review of Systems Ten systems reviewed and are negative for acute change, except as noted in the HPI.   Physical Exam Updated Vital Signs BP (!) 129/94 (BP Location: Left Arm)   Pulse 92   Temp 98.4 F (36.9 C) (Oral)   Resp 20   Ht 5\' 10"  (1.778 m)   Wt 77.1 kg   SpO2 98%   BMI 24.39 kg/m   Physical Exam Vitals and nursing note reviewed.  Constitutional:      General: He is not in acute distress.    Appearance: He is well-developed. He is not diaphoretic.  HENT:     Head: Normocephalic and atraumatic.  Eyes:     General: No scleral icterus.    Conjunctiva/sclera: Conjunctivae normal.  Cardiovascular:     Rate and Rhythm: Regular rhythm. Tachycardia present.     Heart sounds: Normal heart sounds.  Pulmonary:     Effort: Pulmonary effort is normal. No respiratory distress.     Breath sounds: Normal breath sounds. No rales.  Abdominal:     Palpations: Abdomen is soft.     Tenderness: There is no abdominal tenderness.  Musculoskeletal:     Cervical back: Normal range of motion and neck supple.  Skin:    General: Skin is warm and dry.  Neurological:     Mental Status: He is alert.  Psychiatric:        Mood and Affect: Mood is anxious. Affect is tearful.        Behavior: Behavior normal.     ED Results / Procedures /  Treatments   Labs (all labs ordered are listed, but only abnormal results are displayed) Labs Reviewed  BASIC  METABOLIC PANEL - Abnormal; Notable for the following components:      Result Value   Sodium 127 (*)    Chloride 92 (*)    Glucose, Bld 473 (*)    BUN 22 (*)    All other components within normal limits  CBC - Abnormal; Notable for the following components:   WBC 10.8 (*)    All other components within normal limits  TROPONIN I (HIGH SENSITIVITY) - Abnormal; Notable for the following components:   Troponin I (High Sensitivity) 42 (*)    All other components within normal limits  TROPONIN I (HIGH SENSITIVITY)    EKG EKG Interpretation  Date/Time:  Wednesday November 08 2020 13:06:46 EST Ventricular Rate:  109 PR Interval:  174 QRS Duration: 78 QT Interval:  344 QTC Calculation: 463 R Axis:   99 Text Interpretation: Sinus tachycardia with Premature atrial complexes Anterolateral infarct , age undetermined Abnormal ECG Confirmed by Milton Ferguson [phone]) on 11/08/2020 2:49:19 PM   Radiology DG Chest 2 View  Result Date: 11/08/2020 CLINICAL DATA:  Shortness of breath. EXAM: CHEST - 2 VIEW COMPARISON:  September 22, 2020. FINDINGS: Stable cardiomediastinal silhouette. Status post coronary bypass graft. No pneumothorax or significant pleural effusion is noted. Mild interstitial densities are noted in both lung bases concerning for possible pulmonary edema. Bony thorax is unremarkable. IMPRESSION: Mild bibasilar interstitial densities are noted concerning for possible pulmonary edema. Aortic Atherosclerosis (ICD10-I70.0). Electronically Signed   By: Marijo Conception M.D.   On: 11/08/2020 13:44    Procedures .Critical Care Performed by: Margarita Mail, PA-C Authorized by: Margarita Mail, PA-C   Critical care provider statement:    Critical care time (minutes):  45   Critical care time was exclusive of:  Separately billable procedures and treating other patients   Critical care was necessary to treat or prevent imminent or life-threatening deterioration of the following conditions:   Cardiac failure   Critical care was time spent personally by me on the following activities:  Discussions with consultants, evaluation of patient's response to treatment, examination of patient, ordering and performing treatments and interventions, ordering and review of laboratory studies, ordering and review of radiographic studies, pulse oximetry, re-evaluation of patient's condition, obtaining history from patient or surrogate and review of old charts   (including critical care time)  Medications Ordered in ED Medications - No data to display  ED Course  I have reviewed the triage vital signs and the nursing notes.  Pertinent labs & imaging results that were available during my care of the patient were reviewed by me and considered in my medical decision making (see chart for details).  Clinical Course as of Nov 08 1826  Wed Nov 08, 2020  1826 B Natriuretic Peptide(!): 910.0 [AH]    Clinical Course User Index [AH] Margarita Mail, PA-C   MDM Rules/Calculators/A&P                           CC: Chest pressure and shortness of breath VS:  Vitals:   11/08/20 1603 11/08/20 1630 11/08/20 1700 11/08/20 1730  BP: 100/76 112/87 (!) 119/91 101/86  Pulse: 83 (!) 110 (!) 107 (!) 139  Resp: $Remo'17 16 16 'NTCxk$ (!) 28  Temp:      TempSrc:      SpO2: 96% 98% 97% 95%  Weight:  Height:        RW:ERXVQMG is gathered by patient and EMR. Previous records obtained and reviewed. DDX:The patient's complaint of shortness of breath involves an extensive number of diagnostic and treatment options, and is a complaint that carries with it a high risk of complications, morbidity, and potential mortality. Given the large differential diagnosis, medical decision making is of high complexity. The emergent differential diagnosis for shortness of breath includes, but is not limited to, Pulmonary edema, bronchoconstriction, Pneumonia, Pulmonary embolism, Pneumotherax/ Hemothorax, Dysrythmia, ACS.   Labs: I  ordered reviewed and interpreted labs which included CBC with elevated white blood cell count likely acute phase reaction, BMP with mildly low sodium in the setting of highly elevated blood glucose.  Troponin elevated at 39.  BNP elevated to 910 which appears to be fairly baseline for the patient Imaging: I ordered and reviewed images which included 2 view chest x-ray. I independently visualized and interpreted all imaging. Significant findings include bibasilar edema. EKG: Sinus tachycardia at a rate of 109 with multiple premature atrial complexes Consults: Case discussed with Dr. Domenic Polite who will consult on the patient in the emergency department MDM: Patient here with shortness of breath, chest pressure and tachycardia.  Does not appear to be atrial fibrillation.  I consulted with Dr. Domenic Polite who does not recommend any heart rate medications at this time.  Concern for ACS and patient placed on heparin.  He will be admitted by the cardiac service and admitted over to [facility] Patient disposition:The patient appears reasonably stabilized for admission considering the current resources, flow, and capabilities available in the ED at this time, and I doubt any other [facility] requiring further screening and/or treatment in the ED prior to admission.        Final Clinical Impression(s) / ED Diagnoses Final diagnoses:  None    Rx / DC Orders ED Discharge Orders    None       Margarita Mail, PA-C 11/08/20 1832    Maudie Flakes, MD 11/08/20 2130

## 2020-11-08 NOTE — ED Notes (Signed)
[facility] at bedside 

## 2020-11-08 NOTE — Progress Notes (Signed)
ANTICOAGULATION CONSULT NOTE - Initial Consult  Pharmacy Consult for heparin gtt  Indication: chest pain/ACS  No Known Allergies  Patient Measurements: Height: 5\' 10"  (177.8 cm) Weight: 77.1 kg (170 lb) IBW/kg (Calculated) : 73 Heparin Dosing Weight: HEPARIN DW (KG): 77.1   Vital Signs: Temp: 98.4 F (36.9 C) (12/01 1311) Temp Source: Oral (12/01 1311) BP: 112/87 (12/01 1630) Pulse Rate: 110 (12/01 1630)  Labs: Recent Labs    11/08/20 1344  HGB 14.3  HCT 42.0  PLT 219  CREATININE 0.83    Estimated Creatinine Clearance: 98.9 mL/min (by C-G formula based on SCr of 0.83 mg/dL).   Medical History: Past Medical History:  Diagnosis Date  . Arthritis   . CAD (coronary artery disease)    a. s/p CABG in 11/2019 with LIMA-LAD, SVG-OM1, SVG-PDA and SVG-D1  . CHF (congestive heart failure) ([facility])    a. EF < 20% by echo in 11/2019  . COPD (chronic obstructive pulmonary disease) ([facility])   . Essential hypertension   . PAF (paroxysmal atrial fibrillation) ([facility])   . Type 2 diabetes mellitus ([facility])     Medications:  (Not in a hospital admission)  Scheduled:  . carvedilol  3.125 mg Oral BID WC  . furosemide  40 mg Intravenous BID  . heparin  4,000 Units Intravenous Once   Infusions:  . amiodarone 60 mg/hr (11/08/20 1631)   Followed by  . amiodarone    . heparin     PRN: nitroGLYCERIN Anti-infectives (From admission, onward)   None      Assessment: Kevin Underwood a 59 y.o. male requires anticoagulation with a heparin iv infusion for the indication of  chest pain/ACS. Heparin gtt will be started following pharmacy protocol per pharmacy consult. Patient is not on previous oral anticoagulant that will require aPTT/HL correlation before transitioning to only HL monitoring.   Goal of Therapy:  Heparin level 0.3-0.7 units/ml Monitor platelets by anticoagulation protocol: Yes   Plan:  Give 4000 units bolus x 1 Start heparin infusion at 900 units/hr Check anti-Xa level  in 6 hours and daily while on heparin Continue to monitor H&H and platelets  Heparin level to be drawn in 6 hours  .Lachlyn Vanderstelt 11/08/2020,4:53 PM

## 2020-11-08 NOTE — ED Notes (Signed)
Dr. McDowell at bedside.  

## 2020-11-08 NOTE — Progress Notes (Signed)
This note also relates to the following rows which could not be included: ECG Heart Rate - Cannot attach notes to unvalidated device data    11/08/20 2148  Assess: MEWS Score  Temp 97.6 F (36.4 C)  BP 100/78  Pulse Rate (!) 104  Resp (!) 23  Level of Consciousness Alert  SpO2 99 %  O2 Device Nasal Cannula  O2 Flow Rate (L/min) 2 L/min  Assess: if the MEWS score is Yellow or Red  Were vital signs taken at a resting state? Yes  Focused Assessment No change from prior assessment  Early Detection of Sepsis Score *See Row Information* Low  MEWS guidelines implemented *See Row Information* Yes  Treat  MEWS Interventions Escalated (See documentation below)  Take Vital Signs  Increase Vital Sign Frequency  Yellow: Q 2hr X 2 then Q 4hr X 2, if remains yellow, continue Q 4hrs  Escalate  MEWS: Escalate Yellow: discuss with charge nurse/RN and consider discussing with provider and RRT  Notify: Charge Nurse/RN  Name of Charge Nurse/RN Notified Shanell RN  Date Charge Nurse/RN Notified 11/08/20  Time Charge Nurse/RN Notified 2316

## 2020-11-09 ENCOUNTER — Inpatient Hospital Stay (HOSPITAL_COMMUNITY)
Admission: EM | Disposition: A | Discharge status: Home / Self Care | Payer: Self-pay | Source: Home / Self Care | Attending: Cardiothoracic Surgery

## 2020-11-09 ENCOUNTER — Inpatient Hospital Stay (HOSPITAL_COMMUNITY): Payer: 59

## 2020-11-09 DIAGNOSIS — I25110 Atherosclerotic heart disease of native coronary artery with unstable angina pectoris: Secondary | ICD-10-CM | POA: Diagnosis not present

## 2020-11-09 DIAGNOSIS — I429 Cardiomyopathy, unspecified: Secondary | ICD-10-CM

## 2020-11-09 DIAGNOSIS — R570 Cardiogenic shock: Secondary | ICD-10-CM | POA: Diagnosis not present

## 2020-11-09 DIAGNOSIS — I200 Unstable angina: Secondary | ICD-10-CM | POA: Diagnosis not present

## 2020-11-09 HISTORY — PX: RIGHT/LEFT HEART CATH AND CORONARY/GRAFT ANGIOGRAPHY: CATH118267

## 2020-11-09 LAB — CBC
HCT: 39 % (ref 39.0–52.0)
Hemoglobin: 13.2 g/dL (ref 13.0–17.0)
MCH: 28.2 pg (ref 26.0–34.0)
MCHC: 33.8 g/dL (ref 30.0–36.0)
MCV: 83.3 fL (ref 80.0–100.0)
Platelets: 195 10*3/uL (ref 150–400)
RBC: 4.68 MIL/uL (ref 4.22–5.81)
RDW: 14.6 % (ref 11.5–15.5)
WBC: 10.7 10*3/uL — ABNORMAL HIGH (ref 4.0–10.5)
nRBC: 0 % (ref 0.0–0.2)

## 2020-11-09 LAB — BASIC METABOLIC PANEL
Anion gap: 11 (ref 5–15)
BUN: 23 mg/dL — ABNORMAL HIGH (ref 6–20)
CO2: 26 mmol/L (ref 22–32)
Calcium: 9.5 mg/dL (ref 8.9–10.3)
Chloride: 98 mmol/L (ref 98–111)
Creatinine, Ser: 0.94 mg/dL (ref 0.61–1.24)
GFR, Estimated: >60 mL/min (ref >60)
Glucose, Bld: 137 mg/dL — ABNORMAL HIGH (ref 70–99)
Potassium: 3.6 mmol/L (ref 3.5–5.1)
Sodium: 135 mmol/L (ref 135–145)

## 2020-11-09 LAB — POCT I-STAT 7, (LYTES, BLD GAS, ICA,H+H)
Acid-Base Excess: 4 mmol/L — ABNORMAL HIGH (ref 0.0–2.0)
Bicarbonate: 27.4 mmol/L (ref 20.0–28.0)
Calcium, Ion: 1.22 mmol/L (ref 1.15–1.40)
HCT: 43 % (ref 39.0–52.0)
Hemoglobin: 14.6 g/dL (ref 13.0–17.0)
O2 Saturation: 89 %
Potassium: 3.7 mmol/L (ref 3.5–5.1)
Sodium: 135 mmol/L (ref 135–145)
TCO2: 28 mmol/L (ref 22–32)
pCO2 arterial: 37.2 mmHg (ref 32.0–48.0)
pH, Arterial: 7.474 — ABNORMAL HIGH (ref 7.350–7.450)
pO2, Arterial: 53 mmHg — ABNORMAL LOW (ref 83.0–108.0)

## 2020-11-09 LAB — POCT I-STAT EG7
Acid-Base Excess: 5 mmol/L — ABNORMAL HIGH (ref 0.0–2.0)
Acid-Base Excess: 5 mmol/L — ABNORMAL HIGH (ref 0.0–2.0)
Bicarbonate: 30.4 mmol/L — ABNORMAL HIGH (ref 20.0–28.0)
Bicarbonate: 30.6 mmol/L — ABNORMAL HIGH (ref 20.0–28.0)
Calcium, Ion: 1.2 mmol/L (ref 1.15–1.40)
Calcium, Ion: 1.2 mmol/L (ref 1.15–1.40)
HCT: 43 % (ref 39.0–52.0)
HCT: 44 % (ref 39.0–52.0)
Hemoglobin: 14.6 g/dL (ref 13.0–17.0)
Hemoglobin: 15 g/dL (ref 13.0–17.0)
O2 Saturation: 39 %
O2 Saturation: 41 %
Potassium: 3.6 mmol/L (ref 3.5–5.1)
Potassium: 3.7 mmol/L (ref 3.5–5.1)
Sodium: 136 mmol/L (ref 135–145)
Sodium: 136 mmol/L (ref 135–145)
TCO2: 32 mmol/L (ref 22–32)
TCO2: 32 mmol/L (ref 22–32)
pCO2, Ven: 47.7 mmHg (ref 44.0–60.0)
pCO2, Ven: 48.1 mmHg (ref 44.0–60.0)
pH, Ven: 7.412 (ref 7.250–7.430)
pH, Ven: 7.412 (ref 7.250–7.430)
pO2, Ven: 23 mmHg — CL (ref 32.0–45.0)
pO2, Ven: 24 mmHg — CL (ref 32.0–45.0)

## 2020-11-09 LAB — MRSA PCR SCREENING: MRSA by PCR: NEGATIVE

## 2020-11-09 LAB — LIPID PANEL
Cholesterol: 96 mg/dL (ref 0–200)
HDL: 32 mg/dL — ABNORMAL LOW (ref >40)
LDL Cholesterol: 37 mg/dL (ref 0–99)
Total CHOL/HDL Ratio: 3 RATIO
Triglycerides: 137 mg/dL (ref <150)
VLDL: 27 mg/dL (ref 0–40)

## 2020-11-09 LAB — GLUCOSE, CAPILLARY
Glucose-Capillary: 251 mg/dL — ABNORMAL HIGH (ref 70–99)
Glucose-Capillary: 220 mg/dL — ABNORMAL HIGH (ref 70–99)
Glucose-Capillary: 151 mg/dL — ABNORMAL HIGH (ref 70–99)
Glucose-Capillary: 238 mg/dL — ABNORMAL HIGH (ref 70–99)

## 2020-11-09 LAB — HEPARIN LEVEL (UNFRACTIONATED)
Heparin Unfractionated: <0.1 IU/mL — ABNORMAL LOW (ref 0.30–0.70)
Heparin Unfractionated: 0.1 IU/mL — ABNORMAL LOW (ref 0.30–0.70)

## 2020-11-09 LAB — COOXEMETRY PANEL
Carboxyhemoglobin: 1.2 % (ref 0.5–1.5)
Methemoglobin: 0.9 % (ref 0.0–1.5)
O2 Saturation: 60.2 %
Total hemoglobin: 13.3 g/dL (ref 12.0–16.0)

## 2020-11-09 SURGERY — RIGHT/LEFT HEART CATH AND CORONARY/GRAFT ANGIOGRAPHY
Anesthesia: LOCAL

## 2020-11-09 MED ORDER — LIDOCAINE HCL (PF) 1 % IJ SOLN
INTRAMUSCULAR | Status: AC
  Filled 2020-11-09: qty 30

## 2020-11-09 MED ORDER — ASPIRIN 81 MG PO CHEW: 81.0000 mg | CHEWABLE_TABLET | ORAL | Status: AC

## 2020-11-09 MED ORDER — HEPARIN BOLUS VIA INFUSION
2000.0000 [IU] | Freq: Once | INTRAVENOUS | Status: AC
  Administered 2020-11-09: 2000 [IU] via INTRAVENOUS
  Filled 2020-11-09: qty 2000

## 2020-11-09 MED ORDER — POTASSIUM CHLORIDE CRYS ER 20 MEQ PO TBCR
40.0000 meq | EXTENDED_RELEASE_TABLET | Freq: Once | ORAL | Status: AC
  Administered 2020-11-09: 40 meq via ORAL
  Filled 2020-11-09: qty 2

## 2020-11-09 MED ORDER — FUROSEMIDE 10 MG/ML IJ SOLN
80.0000 mg | Freq: Two times a day (BID) | INTRAMUSCULAR | Status: DC
  Administered 2020-11-10 – 2020-11-12 (×4): 80 mg via INTRAVENOUS
  Filled 2020-11-09 (×4): qty 8

## 2020-11-09 MED ORDER — LORAZEPAM 0.5 MG PO TABS
0.5000 mg | ORAL_TABLET | ORAL | Status: AC | PRN
  Administered 2020-11-09 – 2020-11-11 (×2): 0.5 mg via ORAL
  Filled 2020-11-09 (×2): qty 1

## 2020-11-09 MED ORDER — HEPARIN (PORCINE) IN NACL 1000-0.9 UT/500ML-% IV SOLN
INTRAVENOUS | Status: DC | PRN
  Administered 2020-11-09 (×2): 500 mL

## 2020-11-09 MED ORDER — CHLORHEXIDINE GLUCONATE CLOTH 2 % EX PADS
6.0000 | MEDICATED_PAD | Freq: Every day | CUTANEOUS | Status: DC
  Administered 2020-11-09 – 2020-11-14 (×5): 6 via TOPICAL

## 2020-11-09 MED ORDER — LIDOCAINE HCL (PF) 1 % IJ SOLN
INTRAMUSCULAR | Status: DC | PRN
  Administered 2020-11-09 (×3): 2 mL

## 2020-11-09 MED ORDER — ORAL CARE MOUTH RINSE
15.0000 mL | Freq: Two times a day (BID) | OROMUCOSAL | Status: DC
  Administered 2020-11-09 – 2020-11-16 (×13): 15 mL via OROMUCOSAL

## 2020-11-09 MED ORDER — MIDAZOLAM HCL 2 MG/2ML IJ SOLN
INTRAMUSCULAR | Status: AC
  Filled 2020-11-09: qty 2

## 2020-11-09 MED ORDER — NOREPINEPHRINE 4 MG/250ML-% IV SOLN
0.0000 ug/min | INTRAVENOUS | Status: DC
  Filled 2020-11-09: qty 250

## 2020-11-09 MED ORDER — FENTANYL CITRATE (PF) 100 MCG/2ML IJ SOLN
INTRAMUSCULAR | Status: AC
  Filled 2020-11-09: qty 2

## 2020-11-09 MED ORDER — AMIODARONE HCL IN DEXTROSE 360-4.14 MG/200ML-% IV SOLN
INTRAVENOUS | Status: AC
  Filled 2020-11-09: qty 200

## 2020-11-09 MED ORDER — IOHEXOL 350 MG/ML SOLN
INTRAVENOUS | Status: AC
  Filled 2020-11-09: qty 1

## 2020-11-09 MED ORDER — SODIUM CHLORIDE 0.9% FLUSH: 3.0000 mL | INTRAVENOUS | Status: DC | PRN

## 2020-11-09 MED ORDER — MILRINONE LACTATE IN DEXTROSE 20-5 MG/100ML-% IV SOLN
0.3750 ug/kg/min | INTRAVENOUS | Status: DC
  Administered 2020-11-09 – 2020-11-10 (×2): 0.25 ug/kg/min via INTRAVENOUS
  Administered 2020-11-11 – 2020-11-16 (×13): 0.375 ug/kg/min via INTRAVENOUS
  Administered 2020-11-17: .375 ug/kg/min via INTRAVENOUS
  Administered 2020-11-17 (×2): 0.375 ug/kg/min via INTRAVENOUS
  Filled 2020-11-09 (×19): qty 100

## 2020-11-09 MED ORDER — HEPARIN SODIUM (PORCINE) 1000 UNIT/ML IJ SOLN
INTRAMUSCULAR | Status: DC | PRN
  Administered 2020-11-09: 4000 [IU] via INTRAVENOUS

## 2020-11-09 MED ORDER — SODIUM CHLORIDE 0.9 % IV SOLN: INTRAVENOUS | Status: DC

## 2020-11-09 MED ORDER — SODIUM CHLORIDE 0.9% FLUSH
3.0000 mL | Freq: Two times a day (BID) | INTRAVENOUS | Status: DC
  Administered 2020-11-09 – 2020-11-15 (×11): 3 mL via INTRAVENOUS

## 2020-11-09 MED ORDER — HEPARIN SODIUM (PORCINE) 1000 UNIT/ML IJ SOLN
INTRAMUSCULAR | Status: AC
  Filled 2020-11-09: qty 1

## 2020-11-09 MED ORDER — FUROSEMIDE 10 MG/ML IJ SOLN
INTRAMUSCULAR | Status: DC | PRN
  Administered 2020-11-09: 80 mg via INTRAVENOUS

## 2020-11-09 MED ORDER — SODIUM CHLORIDE 0.9 % IV SOLN: 250.0000 mL | INTRAVENOUS | Status: DC | PRN

## 2020-11-09 MED ORDER — FENTANYL CITRATE (PF) 100 MCG/2ML IJ SOLN
INTRAMUSCULAR | Status: DC | PRN
  Administered 2020-11-09: 25 ug via INTRAVENOUS

## 2020-11-09 MED ORDER — HEPARIN (PORCINE) IN NACL 1000-0.9 UT/500ML-% IV SOLN
INTRAVENOUS | Status: AC
  Filled 2020-11-09: qty 1000

## 2020-11-09 MED ORDER — VERAPAMIL HCL 2.5 MG/ML IV SOLN
INTRAVENOUS | Status: AC
  Filled 2020-11-09: qty 2

## 2020-11-09 MED ORDER — ASPIRIN 81 MG PO CHEW: 81.0000 mg | CHEWABLE_TABLET | ORAL | Status: DC

## 2020-11-09 MED ORDER — IODIXANOL 320 MG/ML IV SOLN
INTRAVENOUS | Status: DC | PRN
  Administered 2020-11-09: 90 mL via INTRA_ARTERIAL

## 2020-11-09 MED ORDER — MIDAZOLAM HCL 2 MG/2ML IJ SOLN
INTRAMUSCULAR | Status: DC | PRN
  Administered 2020-11-09: 1 mg via INTRAVENOUS

## 2020-11-09 SURGICAL SUPPLY — 17 items
CATH INFINITI 5 FR JL3.5 (CATHETERS) ×1 IMPLANT
CATH INFINITI 5FR MULTPACK ANG (CATHETERS) ×1 IMPLANT
CATH SWAN GANZ 7F STRAIGHT (CATHETERS) ×1 IMPLANT
CATH SWAN GANZ VIP 7.5F (CATHETERS) ×1 IMPLANT
DEVICE RAD COMP TR BAND LRG (VASCULAR PRODUCTS) ×1 IMPLANT
GLIDESHEATH SLEND A-KIT 6F 22G (SHEATH) ×1 IMPLANT
GLIDESHEATH SLENDER 7FR .021G (SHEATH) ×1 IMPLANT
GUIDEWIRE INQWIRE 1.5J.035X260 (WIRE) IMPLANT
INQWIRE 1.5J .035X260CM (WIRE) ×2
KIT HEART LEFT (KITS) ×2 IMPLANT
KIT MICROPUNCTURE NIT STIFF (SHEATH) ×1 IMPLANT
PACK CARDIAC CATHETERIZATION (CUSTOM PROCEDURE TRAY) ×2 IMPLANT
SHEATH PINNACLE 8F 10CM (SHEATH) ×1 IMPLANT
SHEATH PROBE COVER 6X72 (BAG) ×2 IMPLANT
SLEEVE REPOSITIONING LENGTH 30 (MISCELLANEOUS) ×1 IMPLANT
TRANSDUCER W/STOPCOCK (MISCELLANEOUS) ×2 IMPLANT
TUBING CIL FLEX 10 FLL-RA (TUBING) ×2 IMPLANT

## 2020-11-09 NOTE — Progress Notes (Signed)
Patient received to cath lab holding area room 6. Placed on telemetry monitor. VSS. Patient denies any chest pain or shortness of breath at this time. IVs patent. Consent reviewed. Will continue to monitor.Kevin Underwood

## 2020-11-09 NOTE — Plan of Care (Signed)
  Problem: Activity: Goal: Risk for activity intolerance will decrease Outcome: Progressing   Problem: Nutrition: Goal: Adequate nutrition will be maintained Outcome: Progressing   

## 2020-11-09 NOTE — Consult Note (Addendum)
Advanced Heart Failure Team Consult Note   Primary Physician: Kirstie Peri, MD PCP-Cardiologist:  Nona Dell, MD HF MD: Shirlee Latch  Reason for Consultation: Cardiogenic shock   HPI:    Kevin Underwood is seen today for evaluation of cardiogenic shock at the request of Dr.End.   Kevin Underwood is a 59 y.o. 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 type 2 diabetes mellitus, COPD, and hypertension.   Echo 10/21 LVEF was 20 to 25% with global hypokinesis, mild RV dysfunction, moderate biatrial enlargement, moderate mitral regurgitation and tricuspid regurgitation. He has not had follow-up in the heart failure clinic other than a telehealth encounter back in January.  Tells me he has been struggling at home with progressive fatigue and SOB. NYHA III-IIIb symptoms. Presented to [facility] ER on 11/30 with increased SOB and chest tightness. HsTRop negative.   Transferred down today for cath.   Cath with severe 2vCAD with patent grafts  RA 16 RV 53/17 PA 55/28 (38) PCWP 29 Fick 3.0/1.5 Thermo 2.7/1.4 PaPI 1.7  Currently denies CP. Mild SOB.    Review of Systems: [y] = yes, [ ]  = no   . General: Weight gain [ ] ; Weight loss [ ] ; Anorexia [ ] ; Fatigue [ y]; Fever [ ] ; Chills [ ] ; Weakness [ ]   . Cardiac: Chest pain/pressure [ ] ; Resting SOB [ y]; Exertional SOB [ y]; Orthopnea [ ] ; Pedal Edema Cove.Etienne ]; Palpitations [ ] ; Syncope [ ] ; Presyncope [ ] ; Paroxysmal nocturnal dyspnea[ ]   . Pulmonary: Cough [ ] ; Wheezing[ ] ; Hemoptysis[ ] ; Sputum [ ] ; Snoring [ ]   . GI: Vomiting[ ] ; Dysphagia[ ] ; Melena[ ] ; Hematochezia [ ] ; Heartburn[ ] ; Abdominal pain [ ] ; Constipation [ ] ; Diarrhea [ ] ; BRBPR [ ]   . GU: Hematuria[ ] ; Dysuria [ ] ; Nocturia[ ]   . Vascular: Pain in legs with walking [ ] ; Pain in feet with lying flat [ ] ;  Non-healing sores [ ] ; Stroke [ ] ; TIA [ ] ; Slurred speech [ ] ;  . Neuro: Headaches[ ] ; Vertigo[ ] ; Seizures[ ] ; Paresthesias[ ] ;Blurred vision [ ] ; Diplopia [ ] ; Vision changes [ ]   . Ortho/Skin: Arthritis [ y]; Joint pain Cove.Etienne ]; Muscle pain [ ] ; Joint swelling [ ] ; Back Pain Cove.Etienne ]; Rash [ ]   . Psych: Depression[ y]; Anxiety[ ]   . Heme: Bleeding problems [ ] ; Clotting disorders [ ] ; Anemia [ ]   . Endocrine: Diabetes Cove.Etienne ]; Thyroid dysfunction[ ]   Past Medical History: Past Medical History:  Diagnosis Date  . Arthritis   . CAD (coronary artery disease)    a. s/p CABG in 11/2019 with LIMA-LAD, SVG-OM1, SVG-PDA and SVG-D1  . CHF (congestive heart failure) (HCC)    a. EF < 20% by echo in 11/2019  . COPD (chronic obstructive pulmonary disease) (HCC)   . Essential hypertension   . PAF (paroxysmal atrial fibrillation) (HCC)   . Type 2 diabetes mellitus (HCC)     Past Surgical History: Past Surgical History:  Procedure Laterality Date  . BACK SURGERY    . CLIPPING OF ATRIAL APPENDAGE N/A 11/26/2019   Procedure: Clipping Of Atrial Appendage using AtriCure 40 Clip;  Surgeon: [name], MD;  Location: [facility] OR;  Service: Open Heart Surgery;  Laterality: N/A;  . CORONARY ARTERY BYPASS GRAFT N/A 11/26/2019   Procedure: CORONARY ARTERY  BYPASS GRAFTING (CABG) using endoscopic greater saphenous vein harvest: svc to OM; svc to Diag; svc to PD; and LIMA to LAD.;  Surgeon: [name], MD;  Location: [facility] OR;  Service: Open Heart Surgery;  Laterality: N/A;  . FOOT SURGERY    . HAND SURGERY    . INTRAOPERATIVE TRANSESOPHAGEAL ECHOCARDIOGRAM  12/04/2019   Procedure: Intraoperative Transesophageal Echocardiogram;  Surgeon: [name], MD;  Location: [facility] OR;  Service: Open Heart Surgery;;  . MAZE N/A 11/26/2019   Procedure: MAZE using Bilateral Pulmonary Vein isolation.;  Surgeon: [name], MD;  Location: [facility] OR;  Service: Open Heart Surgery;  Laterality: N/A;  . PLACEMENT OF IMPELLA  LEFT VENTRICULAR ASSIST DEVICE N/A 11/24/2019   Procedure: PLACEMENT OF IMPELLA 5.5 LEFT VENTRICULAR ASSIST DEVICE;  Surgeon: [name], MD;  Location: [facility] OR;  Service: Open Heart Surgery;  Laterality: N/A;  . REMOVAL OF IMPELLA LEFT VENTRICULAR ASSIST DEVICE Right 12/04/2019   Procedure: REMOVAL OF IMPELLA LEFT VENTRICULAR ASSIST DEVICE, right axilla;  Surgeon: [name], MD;  Location: [facility] OR;  Service: Open Heart Surgery;  Laterality: Right;  . RIGHT/LEFT HEART CATH AND CORONARY ANGIOGRAPHY N/A 11/24/2019   Procedure: RIGHT/LEFT HEART CATH AND CORONARY ANGIOGRAPHY;  Surgeon: Laurey Morale, MD;  Location: [facility] INVASIVE CV LAB;  Service: Cardiovascular;  Laterality: N/A;  . TEE WITHOUT CARDIOVERSION N/A 11/24/2019   Procedure: TRANSESOPHAGEAL ECHOCARDIOGRAM (TEE);  Surgeon: [name], MD;  Location[facility] OR;  Service: Open Heart Surgery;  Laterality: N/A;  . TEE WITHOUT CARDIOVERSION N/A 11/26/2019   Procedure: TRANSESOPHAGEAL ECHOCARDIOGRAM (TEE);  Surgeon: [name], MD;  Location: [facility] OR;  Service: Open Heart Surgery;  Laterality: N/A;    Family History: Family History  Problem Relation Age of Onset  . Arthritis Other   . Lung disease Other   . Asthma Other   . Diabetes Other   . Heart disease Sister        Tumor?    Social History: Social History   Socioeconomic History  . Marital status: Married    Spouse name: Not on file  . Number of children: Not on file  . Years of education: 19  . Highest education level: Not on file  Occupational History  . Not on file  Tobacco Use  . Smoking status: Former Smoker    Packs/day: 1.00    Years: 30.00    Pack years: 30.00    Types: Cigarettes    Quit date: 12/20/2019    Years since quitting: 0.8  . Smokeless tobacco: Never Used  Vaping Use  . Vaping Use: Never used  Substance and Sexual Activity  . Alcohol use: Yes    Comment: Prior history of excessive intake  . Drug use: No  . Sexual activity:  Not on file  Other Topics Concern  . Not on file  Social History Narrative  . Not on file   Social Determinants of Health   Financial Resource Strain:   . Difficulty of Paying Living Expenses: Not on file  Food Insecurity:   . Worried About Programme researcher, broadcasting/film/video in the Last Year: Not on file  . Ran Out of Food in the Last Year: Not on file  Transportation Needs:   . Lack of Transportation (Medical): Not on file  . Lack of Transportation (Non-Medical): Not on file  Physical Activity:   . Days of Exercise per Week: Not on file  . Minutes of Exercise per Session: Not  on file  Stress:   . Feeling of Stress : Not on file  Social Connections:   . Frequency of Communication with Friends and Family: Not on file  . Frequency of Social Gatherings with Friends and Family: Not on file  . Attends Religious Services: Not on file  . Active Member of Clubs or Organizations: Not on file  . Attends Banker Meetings: Not on file  . Marital Status: Not on file    Allergies:  No Known Allergies  Objective:    Vital Signs:   Temp:  [97 F (36.1 C)-97.9 F (36.6 C)] 97.8 F (36.6 C) (12/02 1245) Pulse Rate:  [0-159] 0 (12/02 1836) Resp:  [0-47] 28 (12/02 1836) BP: (83-155)/(56-141) 98/73 (12/02 1831) SpO2:  [0 %-100 %] 97 % (12/02 1836) Weight:  [81.7 kg] 81.7 kg (12/02 0426) Last BM Date: 11/08/20  Weight change: Filed Weights   11/08/20 1308 11/09/20 0426  Weight: 77.1 kg 81.7 kg    Intake/Output:   Intake/Output Summary (Last 24 hours) at 11/09/2020 1901 Last data filed at 11/09/2020 1314 Gross per 24 hour  Intake 677.5 ml  Output 770 ml  Net -92.5 ml      Physical Exam    General:  Lying flat on cath lab table No resp difficulty HEENT: normal Neck: supple. JVP to jaw . Carotids 2+ bilat; no bruits. No lymphadenopathy or thyromegaly appreciated. Cor: PMI nondisplaced. Regular rate & rhythm. +s3 Lungs: clear Abdomen: soft, nontender, nondistended. No  hepatosplenomegaly. No bruits or masses. Good bowel sounds. Extremities: no cyanosis, clubbing, rash, tr edema Neuro: alert & orientedx3, cranial nerves grossly intact. moves all 4 extremities w/o difficulty. Affect pleasant   Telemetry   NSR 70-80s Personally reviewed   Labs   Basic Metabolic Panel: Recent Labs  Lab 11/08/20 1344 11/09/20 1008 11/09/20 1721 11/09/20 1724 11/09/20 1726  NA 127* 135 136 135 136  K 4.4 3.6 3.7 3.7 3.6  CL 92* 98  --   --   --   CO2 24 26  --   --   --   GLUCOSE 473* 137*  --   --   --   BUN 22* 23*  --   --   --   CREATININE 0.83 0.94  --   --   --   CALCIUM 9.4 9.5  --   --   --     Liver Function Tests: No results for input(s): AST, ALT, ALKPHOS, BILITOT, PROT, ALBUMIN in the last 168 hours. No results for input(s): LIPASE, AMYLASE in the last 168 hours. No results for input(s): AMMONIA in the last 168 hours.  CBC: Recent Labs  Lab 11/08/20 1344 11/09/20 0130 11/09/20 1721 11/09/20 1724 11/09/20 1726  WBC 10.8* 10.7*  --   --   --   HGB 14.3 13.2 15.0 14.6 14.6  HCT 42.0 39.0 44.0 43.0 43.0  MCV 84.2 83.3  --   --   --   PLT 219 195  --   --   --     Cardiac Enzymes: No results for input(s): CKTOTAL, CKMB, CKMBINDEX, TROPONINI in the last 168 hours.  BNP: BNP (last 3 results) Recent Labs    11/25/19 0500 09/22/20 0348 11/08/20 1344  BNP 596.1* 939.0* 910.0*    ProBNP (last 3 results) No results for input(s): PROBNP in the last 8760 hours.   CBG: Recent Labs  Lab 11/08/20 2020 11/08/20 2157 11/09/20 0147 11/09/20 0751 11/09/20 1144  GLUCAP 477*  491* 251* 220* 151*    Coagulation Studies: No results for input(s): LABPROT, INR in the last 72 hours.   Imaging    No results found.   Medications:     Current Medications: . aspirin EC  81 mg Oral Daily  . carvedilol  3.125 mg Oral BID WC  . clopidogrel  75 mg Oral Daily  . digoxin  0.125 mg Oral Daily  . furosemide  40 mg Intravenous BID  .  gabapentin  600 mg Oral TID  . insulin aspart  0-15 Units Subcutaneous TID WC  . insulin aspart  0-5 Units Subcutaneous QHS  . insulin aspart  4 Units Subcutaneous TID WC  . insulin glargine  20 Units Subcutaneous QHS  . magnesium oxide  400 mg Oral Daily  . mouth rinse  15 mL Mouth Rinse BID  . mometasone-formoterol  2 puff Inhalation BID  . pantoprazole  40 mg Oral Daily  . rosuvastatin  40 mg Oral q1800  . sodium chloride flush  3 mL Intravenous Q12H  . spironolactone  12.5 mg Oral Daily  . traZODone  50 mg Oral QHS     Infusions: . amiodarone 30 mg/hr (11/09/20 1705)  . heparin 1,100 Units/hr (11/09/20 0457)  . milrinone 0.25 mcg/kg/min (11/09/20 1821)        Assessment/Plan   1. Acute on chronic systolic HF with biventricular HF -> cardiogenic shock - due to iCM - echo 10/21 EF 20-25% mild RV HK - stop carvedilol.  - continue dig - inotrope support as below - needs w/u for advanced therapeis 2. CAD s/p CABG 12/20 - c/b shock. Required Impella support - grafts patent on cath 11/09/20 3. PAF - currently in NSR on IV amio - on heparin 4. DM2 - cover with SSI 5. Hyponatremia - restrict free water  Cath results reviewed with Dr. Okey Dupre. He has patent revascularization but severe biventricular HF with low output/cardiogenic shock. That said he is currently not acidotic and end-organ function stable. Have decided not to place IABP or other mechanical support at this time. Will place IJ swan and start milrinone 0.25. Can use NE to support BP to keep MAPs >= 70.   Will need to begine w/u to see if he is a transplant or durable VAD candidate. If so, can consider Impella 5.5 as bridge but would not place unless he is a candidate for advanced therapies. Start lasix and follow hemodynamics closely. Check repeat echo   CRITICAL CARE Performed by: Arvilla Meres  Total critical care time: 55 minutes  Critical care time was exclusive of separately billable procedures and  treating other patients.  Critical care was necessary to treat or prevent imminent or life-threatening deterioration.  Critical care was time spent personally by me (independent of midlevel providers or residents) on the following activities: development of treatment plan with patient and/or surrogate as well as nursing, discussions with consultants, evaluation of patient's response to treatment, examination of patient, obtaining history from patient or surrogate, ordering and performing treatments and interventions, ordering and review of laboratory studies, ordering and review of radiographic studies, pulse oximetry and re-evaluation of patient's condition.    Length of Stay: 1  Arvilla Meres, MD  11/09/2020, 7:01 PM  Advanced Heart Failure Team Pager [phone] (M-F; 7a - 4p)  Please contact [facility] for night-coverage after hours (4p -7a ) and weekends on [email or web address]

## 2020-11-09 NOTE — Interval H&P Note (Signed)
History and Physical Interval Note:  11/09/2020 4:49 PM  Kevin Underwood  has presented today for surgery, with the diagnosis of unstable angina.  The various methods of treatment have been discussed with the patient and family. After consideration of risks, benefits and other options for treatment, the patient has consented to  Procedure(s): RIGHT/LEFT HEART CATH AND CORONARY/GRAFT ANGIOGRAPHY (N/A) as a surgical intervention.  The patient's history has been reviewed, patient examined, no change in status, stable for surgery.  I have reviewed the patient's chart and labs.  Questions were answered to the patient's satisfaction.    Cath Lab Visit (complete for each Cath Lab visit)  Clinical Evaluation Leading to the Procedure:   ACS: Yes.    Non-ACS:  N/A  [name]

## 2020-11-09 NOTE — Progress Notes (Signed)
[employer] [facility] Heparin Indication: chest pain/ACS  No Known Allergies  Patient Measurements: Height: 5\' 10"  (177.8 cm) Weight: 77.1 kg (170 lb) IBW/kg (Calculated) : 73 Heparin Dosing Weight: HEPARIN DW (KG): 77.1   Vital Signs: Temp: 97.9 F (36.6 C) (12/02 0426) Temp Source: Oral (12/02 0426) BP: 85/72 (12/02 0426) Pulse Rate: 87 (12/02 0426)  Labs: Recent Labs    11/08/20 1344 11/09/20 0130 11/09/20 0321  HGB 14.3 13.2  --   HCT 42.0 39.0  --   PLT 219 195  --   HEPARINUNFRC  --   --  <0.10*  CREATININE 0.83  --   --     Estimated Creatinine Clearance: 98.9 mL/min (by C-G formula based on SCr of 0.83 mg/dL).   Medical History: Past Medical History:  Diagnosis Date  . Arthritis   . CAD (coronary artery disease)    a. s/p CABG in 11/2019 with LIMA-LAD, SVG-OM1, SVG-PDA and SVG-D1  . CHF (congestive heart failure) ([place])    a. EF < 20% by echo in 11/2019  . COPD (chronic obstructive pulmonary disease) ([place])   . Essential hypertension   . PAF (paroxysmal atrial fibrillation) ([place])   . Type 2 diabetes mellitus (HCC)     Medications:  Medications Prior to Admission  Medication Sig Dispense Refill Last Dose  . albuterol (PROVENTIL) (2.5 MG/3ML) 0.083% nebulizer solution Take 2.5 mg by nebulization every 6 (six) hours as needed for wheezing or shortness of breath.    11/08/2020 at Unknown time  . aspirin EC 81 MG tablet Take 81 mg by mouth daily. Swallow whole.   11/07/2020 at Unknown time  . carvedilol (COREG) 6.25 MG tablet TAKE 1 TABLET(6.25 MG) BY MOUTH TWICE DAILY WITH A MEAL (Patient taking differently: Take 6.25 mg by mouth 2 (two) times daily with a meal. ) 180 tablet 1 11/07/2020 at 1800  . clopidogrel (PLAVIX) 75 MG tablet TAKE 1 TABLET(75 MG) BY MOUTH DAILY (Patient taking differently: Take 75 mg by mouth once. ) 90 tablet 1 11/07/2020 at Unknown time  . COMBIVENT RESPIMAT 20-100 MCG/ACT AERS respimat Inhale 1 puff  into the lungs every 6 (six) hours as needed. 4 g 1 11/08/2020 at Unknown time  . dapagliflozin propanediol (FARXIGA) 5 MG TABS tablet Take 1 tablet (5 mg total) by mouth daily before breakfast. 30 tablet 3 11/07/2020 at Unknown time  . digoxin (LANOXIN) 0.125 MG tablet TAKE 1 TABLET(0.125 MG) BY MOUTH DAILY (Patient taking differently: Take 0.125 mg by mouth daily. ) 90 tablet 1 11/07/2020 at Unknown time  . ENTRESTO 24-26 MG TAKE 1 TABLET BY MOUTH TWICE DAILY 60 tablet 3 11/07/2020 at Unknown time  . furosemide (LASIX) 80 MG tablet Take 0.5-1 tablets (40-80 mg total) by mouth See admin instructions. 80mg  in the am 40mg  in the evening 45 tablet 2 11/07/2020 at Unknown time  . gabapentin (NEURONTIN) 600 MG tablet Take 1 tablet (600 mg total) by mouth 3 (three) times daily. 90 tablet 0 11/07/2020 at Unknown time  . magnesium oxide (MAG-OX) 400 (241.3 Mg) MG tablet Take 1 tablet (400 mg total) by mouth daily. 30 tablet 2 11/07/2020 at Unknown time  . metFORMIN (GLUCOPHAGE) 500 MG tablet Take 2 tablets (1,000 mg total) by mouth 2 (two) times daily with a meal. 60 tablet 2 11/07/2020 at Unknown time  . mometasone-formoterol (DULERA) 200-5 MCG/ACT AERO Inhale 2 puffs into the lungs in the morning and at bedtime. 1 each 2 11/07/2020 at  Unknown time  . Multiple Vitamin (MULTIVITAMIN WITH MINERALS) TABS tablet Take 1 tablet by mouth daily.   11/07/2020 at Unknown time  . OLANZapine (ZYPREXA) 5 MG tablet Take 5 mg by mouth at bedtime.   11/07/2020 at Unknown time  . pantoprazole (PROTONIX) 40 MG tablet Take 1 tablet (40 mg total) by mouth daily. 30 tablet 2 11/07/2020 at Unknown time  . potassium chloride SA (KLOR-CON) 20 MEQ tablet Take 2 tablets (40 mEq total) by mouth 2 (two) times daily. 120 tablet 2 11/07/2020 at Unknown time  . rosuvastatin (CRESTOR) 40 MG tablet Take 1 tablet (40 mg total) by mouth daily. 30 tablet 3 11/07/2020 at Unknown time  . spironolactone (ALDACTONE) 25 MG tablet Take 0.5 tablets  (12.5 mg total) by mouth daily. 15 tablet 6 11/07/2020 at Unknown time  . Tetrahydrozoline HCl (REDNESS RELIEVER EYE DROPS OP) Apply 2 drops to eye daily as needed.   11/07/2020 at Unknown time  . traZODone (DESYREL) 150 MG tablet Take 150 mg by mouth at bedtime.   11/07/2020 at Unknown time  . blood glucose meter kit and supplies KIT Dispense based on patient and insurance preference. Use to check CBG's three times a day. (FOR ICD-9 250.00, 250.01). 1 each 0    Scheduled:  . aspirin EC  81 mg Oral Daily  . carvedilol  3.125 mg Oral BID WC  . clopidogrel  75 mg Oral Daily  . digoxin  0.125 mg Oral Daily  . furosemide  40 mg Intravenous BID  . gabapentin  600 mg Oral TID  . heparin  2,000 Units Intravenous Once  . insulin aspart  0-15 Units Subcutaneous TID WC  . insulin aspart  0-5 Units Subcutaneous QHS  . insulin aspart  4 Units Subcutaneous TID WC  . insulin glargine  20 Units Subcutaneous QHS  . magnesium oxide  400 mg Oral Daily  . mometasone-formoterol  2 puff Inhalation BID  . pantoprazole  40 mg Oral Daily  . rosuvastatin  40 mg Oral q1800  . sacubitril-valsartan  1 tablet Oral BID  . spironolactone  12.5 mg Oral Daily  . traZODone  50 mg Oral QHS   Infusions:  . amiodarone 30 mg/hr (11/08/20 2224)  . heparin 900 Units/hr (11/08/20 1703)   PRN: acetaminophen, albuterol, ondansetron (ZOFRAN) IV Anti-infectives (From admission, onward)   None      Assessment: Kevin Underwood a 59 y.o. male requires anticoagulation with a heparin iv infusion for the indication of  chest pain/ACS. Heparin gtt will be started following pharmacy protocol per pharmacy consult. Patient is not on previous oral anticoagulant that will require aPTT/HL correlation before transitioning to only HL monitoring.   12/2 AM update:  Heparin level is low No issues per RN  Goal of Therapy:  Heparin level 0.3-0.7 units/ml Monitor platelets by anticoagulation protocol: Yes   Plan:  Heparin 2000 units  re-[employer] heparin to 1100 units/hr 1300 heparin level  Narda Bonds, PharmD, BCPS Clinical Pharmacist Phone: [phone]

## 2020-11-09 NOTE — Plan of Care (Signed)
  Problem: Education: Goal: Knowledge of General Education information will improve Description: Including pain rating scale, medication(s)/side effects and non-pharmacologic comfort measures Outcome: Progressing   Problem: Health Behavior/Discharge Planning: Goal: Ability to manage health-related needs will improve Outcome: Progressing   Problem: Clinical Measurements: Goal: Ability to maintain clinical measurements within normal limits will improve Outcome: Progressing   Problem: Clinical Measurements: Goal: Will remain free from infection Outcome: Progressing   Problem: Clinical Measurements: Goal: Diagnostic test results will improve Outcome: Progressing   Problem: Clinical Measurements: Goal: Respiratory complications will improve Outcome: Progressing   Problem: Clinical Measurements: Goal: Cardiovascular complication will be avoided Outcome: Progressing   Problem: Activity: Goal: Risk for activity intolerance will decrease Outcome: Progressing   Problem: Coping: Goal: Level of anxiety will decrease Outcome: Not Progressing   Problem: Pain Managment: Goal: General experience of comfort will improve Outcome: Progressing

## 2020-11-09 NOTE — Progress Notes (Addendum)
Progress Note  Patient Name: Kevin Underwood Date of Encounter: 11/09/2020  [facility] Cardiologist: Nona Dell, MD   Subjective   No chest pain and improved SOB.  Can lie mostly flat today    Inpatient Medications    Scheduled Meds:  aspirin EC  81 mg Oral Daily   carvedilol  3.125 mg Oral BID WC   clopidogrel  75 mg Oral Daily   digoxin  0.125 mg Oral Daily   furosemide  40 mg Intravenous BID   gabapentin  600 mg Oral TID   insulin aspart  0-15 Units Subcutaneous TID WC   insulin aspart  0-5 Units Subcutaneous QHS   insulin aspart  4 Units Subcutaneous TID WC   insulin glargine  20 Units Subcutaneous QHS   magnesium oxide  400 mg Oral Daily   mouth rinse  15 mL Mouth Rinse BID   mometasone-formoterol  2 puff Inhalation BID   pantoprazole  40 mg Oral Daily   rosuvastatin  40 mg Oral q1800   sacubitril-valsartan  1 tablet Oral BID   spironolactone  12.5 mg Oral Daily   traZODone  50 mg Oral QHS   Continuous Infusions:  amiodarone 30 mg/hr (11/09/20 0516)   heparin 1,100 Units/hr (11/09/20 0457)   PRN Meds: acetaminophen, albuterol, ondansetron (ZOFRAN) IV   Vital Signs    Vitals:   11/09/20 0003 11/09/20 0117 11/09/20 0346 11/09/20 0426  BP: 96/74 (!) 155/141 (!) 83/69 (!) 85/72  Pulse: 60 (!) 103 71 87  Resp: 18 16  18   Temp: 97.8 F (36.6 C) (!) 97 F (36.1 C)  97.9 F (36.6 C)  TempSrc: Oral Oral  Oral  SpO2: 98% 96% 94% 99%  Weight:    81.7 kg  Height:        Intake/Output Summary (Last 24 hours) at 11/09/2020 0852 Last data filed at 11/09/2020 0516 Gross per 24 hour  Intake 549.9 ml  Output 420 ml  Net 129.9 ml   Last 3 Weights 11/09/2020 11/08/2020 10/12/2020  Weight (lbs) 180 lb 1.9 oz 170 lb 178 lb 9.6 oz  Weight (kg) 81.7 kg 77.111 kg 81.012 kg      Telemetry    SR - Personally Reviewed  ECG    SR with PACs, T wave inversions V5-6 - PACs Personally Reviewed  Physical Exam   GEN: No acute distress.    Neck: No JVD Cardiac: RRR, soft murmurs, no rubs, or gallops.  Respiratory: Clear to auscultation bilaterally. GI: Soft, nontender, non-distended  MS: No edema; No deformity. Neuro:  Nonfocal  Psych: Normal affect   Labs    High Sensitivity Troponin:   Recent Labs  Lab 11/08/20 1344 11/08/20 1524  TROPONINIHS 42* 39*      Chemistry Recent Labs  Lab 11/08/20 1344  NA 127*  K 4.4  CL 92*  CO2 24  GLUCOSE 473*  BUN 22*  CREATININE 0.83  CALCIUM 9.4  GFRNONAA >60  ANIONGAP 11     Hematology Recent Labs  Lab 11/08/20 1344 11/09/20 0130  WBC 10.8* 10.7*  RBC 4.99 4.68  HGB 14.3 13.2  HCT 42.0 39.0  MCV 84.2 83.3  MCH 28.7 28.2  MCHC 34.0 33.8  RDW 14.4 14.6  PLT 219 195    BNP Recent Labs  Lab 11/08/20 1344  BNP 910.0*     DDimer No results for input(s): DDIMER in the last 168 hours.   Radiology    DG Chest 2 View  Result  Date: 11/08/2020 CLINICAL DATA:  Shortness of breath. EXAM: CHEST - 2 VIEW COMPARISON:  September 22, 2020. FINDINGS: Stable cardiomediastinal silhouette. Status post coronary bypass graft. No pneumothorax or significant pleural effusion is noted. Mild interstitial densities are noted in both lung bases concerning for possible pulmonary edema. Bony thorax is unremarkable. IMPRESSION: Mild bibasilar interstitial densities are noted concerning for possible pulmonary edema. Aortic Atherosclerosis (ICD10-I70.0). Electronically Signed   By: Lupita Raider M.D.   On: 11/08/2020 13:44    Cardiac Studies   Echo 09/21/20 IMPRESSIONS    1. Left ventricular ejection fraction, by estimation, is 20 to 25%. The  left ventricle has severely decreased function. The left ventricle  demonstrates global hypokinesis. The left ventricular internal cavity size  was mildly dilated. Left ventricular  diastolic parameters are indeterminate.  2. Right ventricular systolic function is mildly reduced. The right  ventricular size is mildly enlarged.   3. Left atrial size was moderately dilated.  4. Right atrial size was moderately dilated.  5. The pericardial effusion is circumferential.  6. The mitral valve is abnormal. Moderate mitral valve regurgitation. No  evidence of mitral stenosis.  7. The tricuspid valve is abnormal. Tricuspid valve regurgitation is  moderate.  8. The aortic valve is tricuspid. Aortic valve regurgitation is not  visualized. No aortic stenosis is present.  9. The inferior vena cava is normal in size with greater than 50%  respiratory variability, suggesting right atrial pressure of 3 mmHg.   Comparison(s): Prior Echo showed LV EF 20-25%, severely decreased  function. Grade II diastolic dysfunction. Moderately reduced RV systolic  function. PASP 31.5.   FINDINGS  Left Ventricle: Left ventricular ejection fraction, by estimation, is 20  to 25%. The left ventricle has severely decreased function. The left  ventricle demonstrates global hypokinesis. The left ventricular internal  cavity size was mildly dilated. There  is no left ventricular hypertrophy. Left ventricular diastolic parameters  are indeterminate.   Right Ventricle: The right ventricular size is mildly enlarged. Right  vetricular wall thickness was not assessed. Right ventricular systolic  function is mildly reduced.   Left Atrium: Left atrial size was moderately dilated.   Right Atrium: Right atrial size was moderately dilated.   Pericardium: Trivial pericardial effusion is present. The pericardial  effusion is circumferential.   Mitral Valve: The mitral valve is abnormal. Moderate mitral valve  regurgitation. No evidence of mitral valve stenosis.   Tricuspid Valve: The tricuspid valve is abnormal. Tricuspid valve  regurgitation is moderate . No evidence of tricuspid stenosis.   Aortic Valve: The aortic valve is tricuspid. Aortic valve regurgitation is  not visualized. No aortic stenosis is present.   Pulmonic Valve: The  pulmonic valve was not well visualized. Pulmonic valve  regurgitation is not visualized. No evidence of pulmonic stenosis.   Aorta: The aortic root is normal in size and structure.   Patient Profile     59 y.o. 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 and transferred to [facility].    Assessment & Plan  1.  Unstable angina, patient presents with recent onset shortness of breath with minimal activity and chest tightness since yesterday.  Prior to this reporting NYHA class II-III dyspnea and no regular angina symptoms on medical therapy.  Initial high-sensitivity troponin I is only 42.  ECG shows nonspecific ST-T changes with old anterior infarct pattern.  Will proceed to Rt and Lt cardiac cath.  --on IV heparin  2.  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  He underwent revascularization with CABG last year at which point he had cardiogenic shock requiring Impella and milrinone temporarily.  He has not maintained regular follow-up in the heart failure clinic.  No EP consultation as yet regarding ICD.   --BP soft 89/67 to 90s at times 124/92  No entresto yet  Most likely will need AHF post cath  --on dig, coreg 3.125 BID and lasix 40 BID IV and aldactone  --wt and I&O not accurate.   3.  Moderate mitral and tricuspid regurgitation.  RVSP estimated 46 mmHg consistent with moderate pulmonary hypertension.  4.  History of atrial  fibrillation, rhythm is sinus with frequent PACs now, cannot exclude brief bursts of atrial fibrillation.  He has not been anticoagulated, underwent left atrial appendage clipping with CABG.  Previously on amiodarone.  SR  On amiodarone drip   5.  Type 2 diabetes mellitus.  He is on [place] as an outpatient.  6.  Mixed hyperlipidemia, on Crestor.LDL 37   7.  Hyponatremia yesterday was 127  None ordered for today will order stat.   The patient understands that risks included but are not limited to stroke (1 in 1000), death (1 in 1000), kidney failure [usually temporary] (1 in 500), bleeding (1 in 200), allergic reaction [possibly serious] (1 in 200).     For questions or updates, please contact [facility] Please consult [email or web address] for contact info under        Signed, Nada Boozer, NP  11/09/2020, 8:52 AM    Patient seen and examined with Nada Boozer NP.  Agree as above, with the following exceptions and changes as noted below.  No chest pain overnight, anticipating right and left heart cath today.. Gen: NAD, CV: RRR, no murmurs, Lungs: clear, Abd: soft, Extrem: Warm, well perfused, no edema, Neuro/Psych: alert and oriented x 3, normal mood and affect. All available labs, radiology testing, previous records reviewed.  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  Patient presents with unstable symptoms concerning for angina described as shortness of breath and chest tightness.  Seen initially at [facility] and transferred to [facility] for cardiac catheterization.  We will plan for right and left heart catheterization today.  Consent obtained as noted above.  Parke Poisson, MD 11/09/20 10:28 AM

## 2020-11-09 NOTE — Progress Notes (Signed)
Pt.c/o not feeling good & diaphoretic; V/S taken ;MD on call was called & gave orders .

## 2020-11-09 NOTE — H&P (View-Only) (Signed)
° °Progress Note ° °Patient Name: Kevin Underwood °Date of Encounter: 11/09/2020 ° °[facility] Cardiologist: Samuel McDowell, MD  ° °Subjective  ° °No chest pain and improved SOB.  Can lie mostly flat today   ° °Inpatient Medications  °  °Scheduled Meds: °• aspirin EC  81 mg Oral Daily  °• carvedilol  3.125 mg Oral BID WC  °• clopidogrel  75 mg Oral Daily  °• digoxin  0.125 mg Oral Daily  °• furosemide  40 mg Intravenous BID  °• gabapentin  600 mg Oral TID  °• insulin aspart  0-15 Units Subcutaneous TID WC  °• insulin aspart  0-5 Units Subcutaneous QHS  °• insulin aspart  4 Units Subcutaneous TID WC  °• insulin glargine  20 Units Subcutaneous QHS  °• magnesium oxide  400 mg Oral Daily  °• mouth rinse  15 mL Mouth Rinse BID  °• mometasone-formoterol  2 puff Inhalation BID  °• pantoprazole  40 mg Oral Daily  °• rosuvastatin  40 mg Oral q1800  °• sacubitril-valsartan  1 tablet Oral BID  °• spironolactone  12.5 mg Oral Daily  °• traZODone  50 mg Oral QHS  ° °Continuous Infusions: °• amiodarone 30 mg/hr (11/09/20 0516)  °• heparin 1,100 Units/hr (11/09/20 0457)  ° °PRN Meds: °acetaminophen, albuterol, ondansetron (ZOFRAN) IV  ° °Vital Signs  °  °Vitals:  ° 11/09/20 0003 11/09/20 0117 11/09/20 0346 11/09/20 0426  °BP: 96/74 (!) 155/141 (!) 83/69 (!) 85/72  °Pulse: 60 (!) 103 71 87  °Resp: 18 16  18  °Temp: 97.8 °F (36.6 °C) (!) 97 °F (36.1 °C)  97.9 °F (36.6 °C)  °TempSrc: Oral Oral  Oral  °SpO2: 98% 96% 94% 99%  °Weight:    81.7 kg  °Height:      ° ° °Intake/Output Summary (Last 24 hours) at 11/09/2020 0852 °Last data filed at 11/09/2020 0516 °Gross per 24 hour  °Intake 549.9 ml  °Output 420 ml  °Net 129.9 ml  ° °Last 3 Weights 11/09/2020 11/08/2020 10/12/2020  °Weight (lbs) 180 lb 1.9 oz 170 lb 178 lb 9.6 oz  °Weight (kg) 81.7 kg 77.111 kg 81.012 kg  °   ° °Telemetry  °  °SR - Personally Reviewed ° °ECG  °  °SR with PACs, T wave inversions V5-6 - PACs Personally Reviewed ° °Physical Exam  ° °GEN: No acute distress.    °Neck: No JVD °Cardiac: RRR, soft murmurs, no rubs, or gallops.  °Respiratory: Clear to auscultation bilaterally. °GI: Soft, nontender, non-distended  °MS: No edema; No deformity. °Neuro:  Nonfocal  °Psych: Normal affect  ° °Labs  °  °High Sensitivity Troponin:   °Recent Labs  °Lab 11/08/20 °1344 11/08/20 °1524  °TROPONINIHS 42* 39*  °   ° °Chemistry °Recent Labs  °Lab 11/08/20 °1344  °NA 127*  °K 4.4  °CL 92*  °CO2 24  °GLUCOSE 473*  °BUN 22*  °CREATININE 0.83  °CALCIUM 9.4  °GFRNONAA >60  °ANIONGAP 11  °  ° °Hematology °Recent Labs  °Lab 11/08/20 °1344 11/09/20 °0130  °WBC 10.8* 10.7*  °RBC 4.99 4.68  °HGB 14.3 13.2  °HCT 42.0 39.0  °MCV 84.2 83.3  °MCH 28.7 28.2  °MCHC 34.0 33.8  °RDW 14.4 14.6  °PLT 219 195  ° ° °BNP °Recent Labs  °Lab 11/08/20 °1344  °BNP 910.0*  °  ° °DDimer No results for input(s): DDIMER in the last 168 hours.  ° °Radiology  °  °DG Chest 2 View ° °Result   Date: 11/08/2020 °CLINICAL DATA:  Shortness of breath. EXAM: CHEST - 2 VIEW COMPARISON:  September 22, 2020. FINDINGS: Stable cardiomediastinal silhouette. Status post coronary bypass graft. No pneumothorax or significant pleural effusion is noted. Mild interstitial densities are noted in both lung bases concerning for possible pulmonary edema. Bony thorax is unremarkable. IMPRESSION: Mild bibasilar interstitial densities are noted concerning for possible pulmonary edema. Aortic Atherosclerosis (ICD10-I70.0). Electronically Signed   By: Verle  Green Jr M.D.   On: 11/08/2020 13:44  ° ° °Cardiac Studies  ° °Echo 09/21/20 °IMPRESSIONS  ° ° ° 1. Left ventricular ejection fraction, by estimation, is 20 to 25%. The  °left ventricle has severely decreased function. The left ventricle  °demonstrates global hypokinesis. The left ventricular internal cavity size  °was mildly dilated. Left ventricular  °diastolic parameters are indeterminate.  ° 2. Right ventricular systolic function is mildly reduced. The right  °ventricular size is mildly enlarged.   ° 3. Left atrial size was moderately dilated.  ° 4. Right atrial size was moderately dilated.  ° 5. The pericardial effusion is circumferential.  ° 6. The mitral valve is abnormal. Moderate mitral valve regurgitation. No  °evidence of mitral stenosis.  ° 7. The tricuspid valve is abnormal. Tricuspid valve regurgitation is  °moderate.  ° 8. The aortic valve is tricuspid. Aortic valve regurgitation is not  °visualized. No aortic stenosis is present.  ° 9. The inferior vena cava is normal in size with greater than 50%  °respiratory variability, suggesting right atrial pressure of 3 mmHg.  ° °Comparison(s): Prior Echo showed LV EF 20-25%, severely decreased  °function. Grade II diastolic dysfunction. Moderately reduced RV systolic  °function. PASP 31.5.  ° °FINDINGS  ° Left Ventricle: Left ventricular ejection fraction, by estimation, is 20  °to 25%. The left ventricle has severely decreased function. The left  °ventricle demonstrates global hypokinesis. The left ventricular internal  °cavity size was mildly dilated. There  °is no left ventricular hypertrophy. Left ventricular diastolic parameters  °are indeterminate.  ° °Right Ventricle: The right ventricular size is mildly enlarged. Right  °vetricular wall thickness was not assessed. Right ventricular systolic  °function is mildly reduced.  ° °Left Atrium: Left atrial size was moderately dilated.  ° °Right Atrium: Right atrial size was moderately dilated.  ° °Pericardium: Trivial pericardial effusion is present. The pericardial  °effusion is circumferential.  ° °Mitral Valve: The mitral valve is abnormal. Moderate mitral valve  °regurgitation. No evidence of mitral valve stenosis.  ° °Tricuspid Valve: The tricuspid valve is abnormal. Tricuspid valve  °regurgitation is moderate . No evidence of tricuspid stenosis.  ° °Aortic Valve: The aortic valve is tricuspid. Aortic valve regurgitation is  °not visualized. No aortic stenosis is present.  ° °Pulmonic Valve: The  pulmonic valve was not well visualized. Pulmonic valve  °regurgitation is not visualized. No evidence of pulmonic stenosis.  ° °Aorta: The aortic root is normal in size and structure.  ° °Patient Profile  °   °59 y.o. 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   ° °Assessment & Plan  °  °  1.  Unstable angina, patient presents with recent onset shortness of breath with minimal activity and chest tightness since yesterday.  Prior to this reporting NYHA class II-III dyspnea and no regular angina symptoms on medical therapy.  Initial high-sensitivity troponin I is only 42.  ECG shows nonspecific ST-T changes with old anterior infarct pattern.  Will proceed to Rt and Lt cardiac cath.  °--on IV heparin °  °2.  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  He underwent revascularization with CABG last year at which point he had cardiogenic shock requiring Impella and milrinone temporarily.  He has not maintained regular follow-up in the heart failure clinic.  No EP consultation as yet regarding ICD.   °--BP soft 89/67 to 90s at times 124/92  No entresto yet  Most likely will need AHF post cath  °--on dig, coreg 3.125 BID and lasix 40 BID IV and aldactone  °--wt and I&O not accurate.  °  °3.  Moderate mitral and tricuspid regurgitation.  RVSP estimated 46 mmHg consistent with moderate pulmonary hypertension. °  °4.  History of atrial  fibrillation, rhythm is sinus with frequent PACs now, cannot exclude brief bursts of atrial fibrillation.  He has not been anticoagulated, underwent left atrial appendage clipping with CABG.  Previously on amiodarone.  SR  On amiodarone drip  °  °5.  Type 2 diabetes mellitus.  He is on [place] as an outpatient. °  °6.  Mixed hyperlipidemia, on Crestor.LDL 37  ° °7.  Hyponatremia yesterday was 127  None ordered for today will order stat.  °  °The patient understands that risks included but are not limited to stroke (1 in 1000), death (1 in 1000), kidney failure [usually temporary] (1 in 500), bleeding (1 in 200), allergic reaction [possibly serious] (1 in 200).  ° ° ° °For questions or updates, please contact [facility] °Please consult [email or web address] for contact info under  ° °  °   °Signed, °Laura Ingold, NP  °11/09/2020, 8:52 AM   ° °Patient seen and examined with Laura Ingold NP.  Agree as above, with the following exceptions and changes as noted below.  No chest pain overnight, anticipating right and left heart cath today.. Gen: NAD, CV: RRR, no murmurs, Lungs: clear, Abd: soft, Extrem: Warm, well perfused, no edema, Neuro/Psych: alert and oriented x 3, normal mood and affect. All available labs, radiology testing, previous records reviewed.  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  Patient presents with unstable symptoms concerning for angina described as shortness of breath and chest tightness.  Seen initially at [facility] and transferred to [facility] for cardiac catheterization.  We will plan for right and left heart catheterization today.  Consent obtained as noted above. ° °Mahkai Fangman A Tiffanni Scarfo, MD °11/09/20 10:28 AM ° ° °

## 2020-11-10 ENCOUNTER — Inpatient Hospital Stay (HOSPITAL_COMMUNITY): Payer: 59

## 2020-11-10 ENCOUNTER — Encounter (HOSPITAL_COMMUNITY)
Admission: EM | Disposition: A | Discharge status: Home / Self Care | Payer: Self-pay | Source: Home / Self Care | Attending: Cardiothoracic Surgery

## 2020-11-10 ENCOUNTER — Inpatient Hospital Stay (HOSPITAL_COMMUNITY): Payer: 59 | Admitting: Anesthesiology

## 2020-11-10 ENCOUNTER — Encounter (HOSPITAL_COMMUNITY): Payer: 59

## 2020-11-10 ENCOUNTER — Encounter (HOSPITAL_COMMUNITY): Payer: Self-pay | Admitting: Internal Medicine

## 2020-11-10 DIAGNOSIS — I5021 Acute systolic (congestive) heart failure: Secondary | ICD-10-CM

## 2020-11-10 DIAGNOSIS — I509 Heart failure, unspecified: Secondary | ICD-10-CM

## 2020-11-10 DIAGNOSIS — R570 Cardiogenic shock: Secondary | ICD-10-CM

## 2020-11-10 HISTORY — PX: PLACEMENT OF IMPELLA LEFT VENTRICULAR ASSIST DEVICE: SHX6519

## 2020-11-10 HISTORY — PX: TEE WITHOUT CARDIOVERSION: SHX5443

## 2020-11-10 LAB — TYPE AND SCREEN
ABO/RH(D): B POS
Antibody Screen: NEGATIVE

## 2020-11-10 LAB — COMPREHENSIVE METABOLIC PANEL
ALT: 26 U/L (ref 0–44)
AST: 21 U/L (ref 15–41)
Albumin: 3 g/dL — ABNORMAL LOW (ref 3.5–5.0)
Alkaline Phosphatase: 81 U/L (ref 38–126)
Anion gap: 13 (ref 5–15)
BUN: 23 mg/dL — ABNORMAL HIGH (ref 6–20)
CO2: 22 mmol/L (ref 22–32)
Calcium: 8.4 mg/dL — ABNORMAL LOW (ref 8.9–10.3)
Chloride: 97 mmol/L — ABNORMAL LOW (ref 98–111)
Creatinine, Ser: 1.08 mg/dL (ref 0.61–1.24)
GFR, Estimated: >60 mL/min (ref >60)
Glucose, Bld: 211 mg/dL — ABNORMAL HIGH (ref 70–99)
Potassium: 3.9 mmol/L (ref 3.5–5.1)
Sodium: 132 mmol/L — ABNORMAL LOW (ref 135–145)
Total Bilirubin: 1.1 mg/dL (ref 0.3–1.2)
Total Protein: 5.8 g/dL — ABNORMAL LOW (ref 6.5–8.1)

## 2020-11-10 LAB — CBC WITH DIFFERENTIAL/PLATELET
Abs Immature Granulocytes: 0.06 10*3/uL (ref 0.00–0.07)
Basophils Absolute: 0 10*3/uL (ref 0.0–0.1)
Basophils Relative: 0 %
Eosinophils Absolute: 0 10*3/uL (ref 0.0–0.5)
Eosinophils Relative: 0 %
HCT: 36.6 % — ABNORMAL LOW (ref 39.0–52.0)
Hemoglobin: 12.3 g/dL — ABNORMAL LOW (ref 13.0–17.0)
Immature Granulocytes: 0 %
Lymphocytes Relative: 5 %
Lymphs Abs: 0.7 10*3/uL (ref 0.7–4.0)
MCH: 28.3 pg (ref 26.0–34.0)
MCHC: 33.6 g/dL (ref 30.0–36.0)
MCV: 84.3 fL (ref 80.0–100.0)
Monocytes Absolute: 0.4 10*3/uL (ref 0.1–1.0)
Monocytes Relative: 3 %
Neutro Abs: 12.2 10*3/uL — ABNORMAL HIGH (ref 1.7–7.7)
Neutrophils Relative %: 92 %
Platelets: 177 10*3/uL (ref 150–400)
RBC: 4.34 MIL/uL (ref 4.22–5.81)
RDW: 14.8 % (ref 11.5–15.5)
WBC: 13.3 10*3/uL — ABNORMAL HIGH (ref 4.0–10.5)
nRBC: 0 % (ref 0.0–0.2)

## 2020-11-10 LAB — BASIC METABOLIC PANEL
Anion gap: 12 (ref 5–15)
Anion gap: 13 (ref 5–15)
BUN: 27 mg/dL — ABNORMAL HIGH (ref 6–20)
BUN: 24 mg/dL — ABNORMAL HIGH (ref 6–20)
CO2: 26 mmol/L (ref 22–32)
CO2: 22 mmol/L (ref 22–32)
Calcium: 8.4 mg/dL — ABNORMAL LOW (ref 8.9–10.3)
Calcium: 8.3 mg/dL — ABNORMAL LOW (ref 8.9–10.3)
Chloride: 94 mmol/L — ABNORMAL LOW (ref 98–111)
Chloride: 98 mmol/L (ref 98–111)
Creatinine, Ser: 1.04 mg/dL (ref 0.61–1.24)
Creatinine, Ser: 1.03 mg/dL (ref 0.61–1.24)
GFR, Estimated: >60 mL/min (ref >60)
GFR, Estimated: >60 mL/min (ref >60)
Glucose, Bld: 261 mg/dL — ABNORMAL HIGH (ref 70–99)
Glucose, Bld: 137 mg/dL — ABNORMAL HIGH (ref 70–99)
Potassium: 4.1 mmol/L (ref 3.5–5.1)
Potassium: 3.7 mmol/L (ref 3.5–5.1)
Sodium: 132 mmol/L — ABNORMAL LOW (ref 135–145)
Sodium: 133 mmol/L — ABNORMAL LOW (ref 135–145)

## 2020-11-10 LAB — CBC
HCT: 36.8 % — ABNORMAL LOW (ref 39.0–52.0)
Hemoglobin: 12.5 g/dL — ABNORMAL LOW (ref 13.0–17.0)
MCH: 28.9 pg (ref 26.0–34.0)
MCHC: 34 g/dL (ref 30.0–36.0)
MCV: 85 fL (ref 80.0–100.0)
Platelets: 196 10*3/uL (ref 150–400)
RBC: 4.33 MIL/uL (ref 4.22–5.81)
RDW: 14.9 % (ref 11.5–15.5)
WBC: 10.7 10*3/uL — ABNORMAL HIGH (ref 4.0–10.5)
nRBC: 0 % (ref 0.0–0.2)

## 2020-11-10 LAB — GLUCOSE, CAPILLARY
Glucose-Capillary: 143 mg/dL — ABNORMAL HIGH (ref 70–99)
Glucose-Capillary: 241 mg/dL — ABNORMAL HIGH (ref 70–99)
Glucose-Capillary: 121 mg/dL — ABNORMAL HIGH (ref 70–99)
Glucose-Capillary: 237 mg/dL — ABNORMAL HIGH (ref 70–99)

## 2020-11-10 LAB — ECHOCARDIOGRAM COMPLETE
Area-P 1/2: 3.6 cm2
Height: 70 in
MV M vel: 3.84 m/s
MV Peak grad: 59 mmHg
Radius: 0.7 cm
S' Lateral: 5.1 cm
Weight: 2906.54 oz

## 2020-11-10 LAB — ECHO INTRAOPERATIVE TEE
Height: 70 in
Weight: 2906.54 oz

## 2020-11-10 LAB — COOXEMETRY PANEL
Carboxyhemoglobin: 1.1 % (ref 0.5–1.5)
Carboxyhemoglobin: 0.8 % (ref 0.5–1.5)
Methemoglobin: 0.8 % (ref 0.0–1.5)
Methemoglobin: 0.7 % (ref 0.0–1.5)
O2 Saturation: 48.6 %
O2 Saturation: 36.2 %
Total hemoglobin: 12.1 g/dL (ref 12.0–16.0)
Total hemoglobin: 16.6 g/dL — ABNORMAL HIGH (ref 12.0–16.0)

## 2020-11-10 LAB — LIPID PANEL
Cholesterol: 102 mg/dL (ref 0–200)
HDL: 40 mg/dL — ABNORMAL LOW (ref >40)
LDL Cholesterol: 53 mg/dL (ref 0–99)
Total CHOL/HDL Ratio: 2.6 RATIO
Triglycerides: 43 mg/dL (ref <150)
VLDL: 9 mg/dL (ref 0–40)

## 2020-11-10 LAB — LACTATE DEHYDROGENASE
LDH: 214 U/L — ABNORMAL HIGH (ref 98–192)
LDH: 216 U/L — ABNORMAL HIGH (ref 98–192)

## 2020-11-10 LAB — PROTIME-INR
INR: 1.1 (ref 0.8–1.2)
Prothrombin Time: 13.8 seconds (ref 11.4–15.2)

## 2020-11-10 LAB — HEMOGLOBIN A1C
Hgb A1c MFr Bld: 10.4 % — ABNORMAL HIGH (ref 4.8–5.6)
Mean Plasma Glucose: 251.78 mg/dL

## 2020-11-10 LAB — MAGNESIUM: Magnesium: 2 mg/dL (ref 1.7–2.4)

## 2020-11-10 LAB — URIC ACID: Uric Acid, Serum: 7.8 mg/dL (ref 3.7–8.6)

## 2020-11-10 LAB — APTT: aPTT: 42 seconds — ABNORMAL HIGH (ref 24–36)

## 2020-11-10 SURGERY — INSERTION, CARDIAC ASSIST DEVICE, IMPELLA
Anesthesia: General

## 2020-11-10 MED ORDER — HEPARIN (PORCINE) 25000 UT/250ML-% IV SOLN
1200.0000 [IU]/h | INTRAVENOUS | Status: DC
  Administered 2020-11-10: 1200 [IU]/h via INTRAVENOUS
  Filled 2020-11-10: qty 250

## 2020-11-10 MED ORDER — MIDAZOLAM HCL 2 MG/2ML IJ SOLN
INTRAMUSCULAR | Status: AC
  Filled 2020-11-10: qty 2

## 2020-11-10 MED ORDER — ETOMIDATE 2 MG/ML IV SOLN
INTRAVENOUS | Status: DC | PRN
  Administered 2020-11-10: 10 mg via INTRAVENOUS

## 2020-11-10 MED ORDER — CEFAZOLIN SODIUM-DEXTROSE 2-4 GM/100ML-% IV SOLN
INTRAVENOUS | Status: AC
  Filled 2020-11-10: qty 100

## 2020-11-10 MED ORDER — PERFLUTREN LIPID MICROSPHERE
INTRAVENOUS | Status: AC
  Administered 2020-11-10: 2 mL via INTRAVENOUS
  Filled 2020-11-10: qty 10

## 2020-11-10 MED ORDER — HEPARIN SODIUM (PORCINE) 5000 UNIT/ML IJ SOLN
INTRAVENOUS | Status: DC
  Filled 2020-11-10 (×5): qty 10

## 2020-11-10 MED ORDER — PROPOFOL 10 MG/ML IV BOLUS
INTRAVENOUS | Status: AC
  Filled 2020-11-10: qty 20

## 2020-11-10 MED ORDER — OXYCODONE-ACETAMINOPHEN 5-325 MG PO TABS
1.0000 | ORAL_TABLET | ORAL | Status: DC | PRN
  Administered 2020-11-10: 1 via ORAL
  Administered 2020-11-11: 2 via ORAL
  Administered 2020-11-11: 1 via ORAL
  Administered 2020-11-11: 2 via ORAL
  Administered 2020-11-11 – 2020-11-12 (×2): 1 via ORAL
  Administered 2020-11-12 (×4): 2 via ORAL
  Administered 2020-11-13: 1 via ORAL
  Filled 2020-11-10: qty 1
  Filled 2020-11-10 (×4): qty 2
  Filled 2020-11-10 (×3): qty 1
  Filled 2020-11-10: qty 2
  Filled 2020-11-10: qty 1
  Filled 2020-11-10 (×2): qty 2

## 2020-11-10 MED ORDER — NOREPINEPHRINE 4 MG/250ML-% IV SOLN
INTRAVENOUS | Status: DC | PRN
  Administered 2020-11-10: 5 ug/min via INTRAVENOUS

## 2020-11-10 MED ORDER — HYDRALAZINE HCL 20 MG/ML IJ SOLN: 10.0000 mg | INTRAMUSCULAR | Status: AC | PRN

## 2020-11-10 MED ORDER — NITROGLYCERIN IN D5W 200-5 MCG/ML-% IV SOLN
10.0000 ug/min | INTRAVENOUS | Status: AC
  Administered 2020-11-10: 10 ug/min via INTRAVENOUS
  Filled 2020-11-10: qty 250

## 2020-11-10 MED ORDER — DEXAMETHASONE SODIUM PHOSPHATE 10 MG/ML IJ SOLN
INTRAMUSCULAR | Status: DC | PRN
  Administered 2020-11-10: 4 mg via INTRAVENOUS

## 2020-11-10 MED ORDER — ROCURONIUM BROMIDE 10 MG/ML (PF) SYRINGE
PREFILLED_SYRINGE | INTRAVENOUS | Status: AC
  Filled 2020-11-10: qty 10

## 2020-11-10 MED ORDER — SODIUM CHLORIDE 0.9% FLUSH
3.0000 mL | INTRAVENOUS | Status: DC | PRN
  Administered 2020-11-11 – 2020-11-16 (×2): 3 mL via INTRAVENOUS

## 2020-11-10 MED ORDER — MIDAZOLAM HCL (PF) 10 MG/2ML IJ SOLN
INTRAMUSCULAR | Status: AC
  Filled 2020-11-10: qty 2

## 2020-11-10 MED ORDER — SODIUM CHLORIDE 0.9% FLUSH
10.0000 mL | Freq: Two times a day (BID) | INTRAVENOUS | Status: DC
  Administered 2020-11-11 – 2020-11-12 (×4): 10 mL
  Administered 2020-11-13: 11:00:00 20 mL
  Administered 2020-11-13 – 2020-11-14 (×2): 10 mL
  Administered 2020-11-14: 20 mL
  Administered 2020-11-15: 21:00:00 10 mL

## 2020-11-10 MED ORDER — ADULT MULTIVITAMIN W/MINERALS CH
1.0000 | ORAL_TABLET | Freq: Every day | ORAL | Status: DC
  Administered 2020-11-11 – 2020-11-18 (×7): 1 via ORAL
  Filled 2020-11-10 (×7): qty 1

## 2020-11-10 MED ORDER — SODIUM CHLORIDE 0.9% FLUSH
3.0000 mL | Freq: Two times a day (BID) | INTRAVENOUS | Status: DC
  Administered 2020-11-10 – 2020-11-15 (×11): 3 mL via INTRAVENOUS

## 2020-11-10 MED ORDER — HEPARIN SODIUM (PORCINE) 1000 UNIT/ML IJ SOLN
INTRAMUSCULAR | Status: AC
  Filled 2020-11-10: qty 1

## 2020-11-10 MED ORDER — EPHEDRINE SULFATE-NACL 50-0.9 MG/10ML-% IV SOSY
PREFILLED_SYRINGE | INTRAVENOUS | Status: DC | PRN
  Administered 2020-11-10: 10 mg via INTRAVENOUS

## 2020-11-10 MED ORDER — FENTANYL CITRATE (PF) 250 MCG/5ML IJ SOLN
INTRAMUSCULAR | Status: AC
  Filled 2020-11-10: qty 10

## 2020-11-10 MED ORDER — NITROGLYCERIN IN D5W 200-5 MCG/ML-% IV SOLN
0.0000 ug/min | INTRAVENOUS | Status: DC
  Administered 2020-11-10: 15 ug/min via INTRAVENOUS
  Administered 2020-11-11: 30 ug/min via INTRAVENOUS
  Filled 2020-11-10: qty 250

## 2020-11-10 MED ORDER — HEPARIN SODIUM (PORCINE) 1000 UNIT/ML IJ SOLN
INTRAMUSCULAR | Status: DC | PRN
  Administered 2020-11-10: 5000 [IU] via INTRAVENOUS
  Administered 2020-11-10: 2000 [IU] via INTRAVENOUS

## 2020-11-10 MED ORDER — HEMOSTATIC AGENTS (NO CHARGE) OPTIME
TOPICAL | Status: DC | PRN
  Administered 2020-11-10: 1 via TOPICAL

## 2020-11-10 MED ORDER — PROTAMINE SULFATE 10 MG/ML IV SOLN
INTRAVENOUS | Status: AC
  Filled 2020-11-10: qty 25

## 2020-11-10 MED ORDER — PERFLUTREN LIPID MICROSPHERE
1.0000 mL | INTRAVENOUS | Status: AC | PRN
  Administered 2020-11-10: 2 mL via INTRAVENOUS
  Filled 2020-11-10: qty 10

## 2020-11-10 MED ORDER — ENSURE ENLIVE PO LIQD
237.0000 mL | Freq: Two times a day (BID) | ORAL | Status: DC
  Administered 2020-11-11: 237 mL via ORAL

## 2020-11-10 MED ORDER — PHENYLEPHRINE HCL-NACL 10-0.9 MG/250ML-% IV SOLN
INTRAVENOUS | Status: DC | PRN
  Administered 2020-11-10: 25 ug/min via INTRAVENOUS

## 2020-11-10 MED ORDER — IOHEXOL 300 MG/ML  SOLN
100.0000 mL | Freq: Once | INTRAMUSCULAR | Status: AC | PRN
  Administered 2020-11-10: 100 mL via INTRAVENOUS

## 2020-11-10 MED ORDER — SUGAMMADEX SODIUM 200 MG/2ML IV SOLN
INTRAVENOUS | Status: DC | PRN
  Administered 2020-11-10: 200 mg via INTRAVENOUS

## 2020-11-10 MED ORDER — LACTATED RINGERS IV SOLN: INTRAVENOUS | Status: DC | PRN

## 2020-11-10 MED ORDER — SODIUM CHLORIDE 0.9% FLUSH: 10.0000 mL | INTRAVENOUS | Status: DC | PRN

## 2020-11-10 MED ORDER — ARTIFICIAL TEARS OPHTHALMIC OINT
TOPICAL_OINTMENT | OPHTHALMIC | Status: AC
  Filled 2020-11-10: qty 3.5

## 2020-11-10 MED ORDER — CEFAZOLIN SODIUM-DEXTROSE 2-4 GM/100ML-% IV SOLN
2.0000 g | INTRAVENOUS | Status: AC
  Administered 2020-11-10: 2 g via INTRAVENOUS

## 2020-11-10 MED ORDER — SODIUM CHLORIDE 0.9 % IV SOLN: 250.0000 mL | INTRAVENOUS | Status: DC | PRN

## 2020-11-10 MED ORDER — SODIUM CHLORIDE 0.9 % IV SOLN
INTRAVENOUS | Status: AC
  Filled 2020-11-10: qty 1.2

## 2020-11-10 MED ORDER — HEPARIN SODIUM (PORCINE) 5000 UNIT/ML IJ SOLN
INTRAVENOUS | Status: DC
  Administered 2020-11-10: 2500 [IU]/h
  Filled 2020-11-10: qty 10

## 2020-11-10 MED ORDER — ENOXAPARIN SODIUM 40 MG/0.4ML ~~LOC~~ SOLN: 40.0000 mg | SUBCUTANEOUS | Status: DC

## 2020-11-10 MED ORDER — VASOPRESSIN 20 UNITS/100 ML INFUSION FOR SHOCK
0.0000 [IU]/min | INTRAVENOUS | Status: DC
  Filled 2020-11-10: qty 100

## 2020-11-10 MED ORDER — FENTANYL CITRATE (PF) 100 MCG/2ML IJ SOLN
INTRAMUSCULAR | Status: DC | PRN
  Administered 2020-11-10 (×2): 50 ug via INTRAVENOUS
  Administered 2020-11-10: 150 ug via INTRAVENOUS

## 2020-11-10 MED ORDER — SODIUM CHLORIDE 0.9 % IV SOLN
INTRAVENOUS | Status: DC | PRN
  Administered 2020-11-10: 500 mL

## 2020-11-10 MED ORDER — SPIRONOLACTONE 12.5 MG HALF TABLET
12.5000 mg | ORAL_TABLET | Freq: Every day | ORAL | Status: DC
  Administered 2020-11-10 – 2020-11-16 (×7): 12.5 mg via ORAL
  Filled 2020-11-10 (×7): qty 1

## 2020-11-10 MED ORDER — ONDANSETRON HCL 4 MG/2ML IJ SOLN
INTRAMUSCULAR | Status: DC | PRN
  Administered 2020-11-10: 4 mg via INTRAVENOUS

## 2020-11-10 MED ORDER — ROCURONIUM BROMIDE 10 MG/ML (PF) SYRINGE
PREFILLED_SYRINGE | INTRAVENOUS | Status: DC | PRN
  Administered 2020-11-10: 20 mg via INTRAVENOUS
  Administered 2020-11-10: 15 mg via INTRAVENOUS
  Administered 2020-11-10: 40 mg via INTRAVENOUS

## 2020-11-10 MED ORDER — TRAMADOL HCL 50 MG PO TABS
50.0000 mg | ORAL_TABLET | Freq: Four times a day (QID) | ORAL | Status: DC | PRN
  Administered 2020-11-10 – 2020-11-13 (×3): 50 mg via ORAL
  Filled 2020-11-10 (×3): qty 1

## 2020-11-10 MED ORDER — 0.9 % SODIUM CHLORIDE (POUR BTL) OPTIME
TOPICAL | Status: DC | PRN
  Administered 2020-11-10: 2000 mL

## 2020-11-10 SURGICAL SUPPLY — 54 items
ANCHOR CATH FOLEY SECURE (MISCELLANEOUS) ×9 IMPLANT
APPLICATOR TIP COSEAL (VASCULAR PRODUCTS) IMPLANT
BLADE CLIPPER SURG (BLADE) ×3 IMPLANT
CATH DIAG EXPO 6F AL1 (CATHETERS) IMPLANT
CATH DIAG EXPO 6F VENT PIG 145 (CATHETERS) ×3 IMPLANT
CATH INFINITI 6F MPB2 (CATHETERS) IMPLANT
CLIP LIGATING EXTRA MED SLVR (CLIP) ×3 IMPLANT
CLIP VESOCCLUDE MED 24/CT (CLIP) ×3 IMPLANT
CLIP VESOCCLUDE SM WIDE 24/CT (CLIP) ×3 IMPLANT
DRAPE C-ARM 42X72 X-RAY (DRAPES) ×3 IMPLANT
DRAPE CV SPLIT W-CLR ANES SCRN (DRAPES) ×3 IMPLANT
DRAPE PERI GROIN 82X75IN TIB (DRAPES) ×3 IMPLANT
DRAPE SLUSH/WARMER DISC (DRAPES) ×3 IMPLANT
DRSG TEGADERM 4X4.5 CHG (GAUZE/BANDAGES/DRESSINGS) ×3 IMPLANT
FELT TEFLON 1X6 (MISCELLANEOUS) ×3 IMPLANT
GAUZE SPONGE 4X4 12PLY STRL (GAUZE/BANDAGES/DRESSINGS) ×3 IMPLANT
GAUZE SPONGE 4X4 12PLY STRL LF (GAUZE/BANDAGES/DRESSINGS) ×3 IMPLANT
GLOVE NEODERM STRL 7.5 LF PF (GLOVE) ×8 IMPLANT
GLOVE SURG NEODERM 7.5  LF PF (GLOVE) ×4
GLOVE SURG SS PI 7.5 STRL IVOR (GLOVE) ×6 IMPLANT
GOWN STRL REUS W/ TWL LRG LVL3 (GOWN DISPOSABLE) ×6 IMPLANT
GOWN STRL REUS W/TWL LRG LVL3 (GOWN DISPOSABLE) ×3
GRAFT HEMASHIELD 10MM (Graft) ×1 IMPLANT
GRAFT VASC STRG 30X10STRL (Graft) ×2 IMPLANT
INSERT FOGARTY SM (MISCELLANEOUS) ×3 IMPLANT
KIT BASIN OR (CUSTOM PROCEDURE TRAY) ×3 IMPLANT
LIGACLIP SM TITANIUM (CLIP) IMPLANT
LOOP VESSEL MINI RED (MISCELLANEOUS) IMPLANT
NS IRRIG 1000ML POUR BTL (IV SOLUTION) ×6 IMPLANT
PACK CHEST (CUSTOM PROCEDURE TRAY) ×3 IMPLANT
PAD ARMBOARD 7.5X6 YLW CONV (MISCELLANEOUS) ×6 IMPLANT
PAD ELECT DEFIB RADIOL ZOLL (MISCELLANEOUS) ×3 IMPLANT
PUMP SET IMPELLA 5.5 US (CATHETERS) ×3 IMPLANT
SEALANT SURG COSEAL 8ML (VASCULAR PRODUCTS) ×3 IMPLANT
STAPLER VISISTAT 35W (STAPLE) ×3 IMPLANT
SUT ETHILON 3 0 PS 1 (SUTURE) IMPLANT
SUT MNCRL AB 4-0 PS2 18 (SUTURE) ×3 IMPLANT
SUT PDS AB 1 CTX 36 (SUTURE) ×3 IMPLANT
SUT PROLENE 5 0 C 1 36 (SUTURE) ×6 IMPLANT
SUT SILK  1 MH (SUTURE) ×4
SUT SILK 1 MH (SUTURE) ×8 IMPLANT
SUT SILK 1 TIES 10X30 (SUTURE) ×3 IMPLANT
SUT SILK 2 0 SH CR/8 (SUTURE) ×3 IMPLANT
SUT VIC AB 2-0 CT1 27 (SUTURE) ×1
SUT VIC AB 2-0 CT1 TAPERPNT 27 (SUTURE) ×2 IMPLANT
SYR 30ML LL (SYRINGE) ×3 IMPLANT
TAPE CLOTH SURG 4X10 WHT LF (GAUZE/BANDAGES/DRESSINGS) ×3 IMPLANT
TOWEL GREEN STERILE (TOWEL DISPOSABLE) ×6 IMPLANT
TOWEL GREEN STERILE FF (TOWEL DISPOSABLE) IMPLANT
TRAY FOLEY SLVR 16FR TEMP STAT (SET/KITS/TRAYS/PACK) ×3 IMPLANT
TUBE CONNECTING 20X1/4 (TUBING) ×3 IMPLANT
WATER STERILE IRR 1000ML POUR (IV SOLUTION) ×3 IMPLANT
WIRE EMERALD 3MM-J .035X260CM (WIRE) ×3 IMPLANT
YANKAUER SUCT BULB TIP NO VENT (SUCTIONS) ×3 IMPLANT

## 2020-11-10 NOTE — Anesthesia Procedure Notes (Signed)
Procedure Name: Intubation Date/Time: 11/10/2020 4:37 PM Performed by: Oletta Lamas, CRNA Pre-anesthesia Checklist: Patient identified, Emergency Drugs available, Suction available and Patient being monitored Patient Re-evaluated:Patient Re-evaluated prior to induction Oxygen Delivery Method: Circle System Utilized Preoxygenation: Pre-oxygenation with 100% oxygen Induction Type: IV induction Ventilation: Mask ventilation without difficulty Laryngoscope Size: Mac and 4 Tube type: Oral Tube size: 8.0 mm Number of attempts: 1 Airway Equipment and Method: Stylet Placement Confirmation: ETT inserted through vocal cords under direct vision,  positive ETCO2 and breath sounds checked- equal and bilateral Secured at: 22 cm Tube secured with: Tape Dental Injury: Teeth and Oropharynx as per pre-operative assessment

## 2020-11-10 NOTE — Consult Note (Signed)
[address]       [place] [address]             [phone]        Kevin Underwood [place] Medical Record #[id number] Date of Birth: 07/01/1961  Referring: No ref. provider found Primary Care: Monico Blitz, MD Primary Cardiologist:Samuel Domenic Polite, MD  Chief Complaint:    Chief Complaint  Patient presents with  . Chest Pain    History of Present Illness:     59 yo man is known to me after CABG/maze one year ago. He was supported perioperatively with Impella. He did well after surgery but has recently had recurrent CHF sx. Underwent LHC showing patent grafts but severely reduced CO. He may be a candidate for LVAD but needs stabilization with Impella.     Current Activity/ Functional Status: Patient will be independent with mobility/ambulation, transfers, ADL's, IADL's.   Zubrod Score: At the time of surgery this patient's most appropriate activity status/level should be described as: _0     0    Normal activity, no symptoms _1     1    Restricted in physical strenuous activity but ambulatory, able to do out light work _2     2    Ambulatory and capable of self care, unable to do work activities, up and about                 more than 50%  Of the time                            _3     3    Only limited self care, in bed greater than 50% of waking hours _4     4    Completely disabled, no self care, confined to bed or chair _5     5    Moribund  Past Medical History:  Diagnosis Date  . Arthritis   . CAD (coronary artery disease)    a. s/p CABG in 11/2019 with LIMA-LAD, SVG-OM1, SVG-PDA and SVG-D1  . CHF (congestive heart failure) ([place])    a. EF < 20% by echo in 11/2019  . COPD (chronic obstructive pulmonary disease) ([place])   . Essential hypertension   . PAF (paroxysmal atrial fibrillation) ([place])   . Type 2 diabetes mellitus ([place])     Past Surgical History:  Procedure Laterality Date  . BACK SURGERY    . CLIPPING OF ATRIAL APPENDAGE N/A 11/26/2019    Procedure: Clipping Of Atrial Appendage using AtriCure 40 Clip;  Surgeon: Wonda Olds, MD;  Location: [place];  Service: Open Heart Surgery;  Laterality: N/A;  . CORONARY ARTERY BYPASS GRAFT N/A 11/26/2019   Procedure: CORONARY ARTERY BYPASS GRAFTING (CABG) using endoscopic greater saphenous vein harvest: svc to OM; svc to Diag; svc to PD; and LIMA to LAD.;  Surgeon: Wonda Olds, MD;  Location: [place];  Service: Open Heart Surgery;  Laterality: N/A;  . FOOT SURGERY    . HAND SURGERY    . INTRAOPERATIVE TRANSESOPHAGEAL ECHOCARDIOGRAM  12/04/2019   Procedure: Intraoperative Transesophageal Echocardiogram;  Surgeon: Wonda Olds, MD;  Location: [facility];  Service: Open Heart Surgery;;  . MAZE N/A 11/26/2019   Procedure: MAZE using Bilateral Pulmonary Vein isolation.;  Surgeon: Wonda Olds, MD;  Location: [place];  Service: Open Heart Surgery;  Laterality: N/A;  . PLACEMENT OF IMPELLA LEFT VENTRICULAR ASSIST DEVICE N/A 11/24/2019  Procedure: PLACEMENT OF IMPELLA 5.5 LEFT VENTRICULAR ASSIST DEVICE;  Surgeon: Wonda Olds, MD;  Location: [place];  Service: Open Heart Surgery;  Laterality: N/A;  . REMOVAL OF IMPELLA LEFT VENTRICULAR ASSIST DEVICE Right 12/04/2019   Procedure: REMOVAL OF IMPELLA LEFT VENTRICULAR ASSIST DEVICE, right axilla;  Surgeon: Wonda Olds, MD;  Location: [place];  Service: Open Heart Surgery;  Laterality: Right;  . RIGHT/LEFT HEART CATH AND CORONARY ANGIOGRAPHY N/A 11/24/2019   Procedure: RIGHT/LEFT HEART CATH AND CORONARY ANGIOGRAPHY;  Surgeon: Larey Dresser, MD;  Location: [place] [facility];  Service: Cardiovascular;  Laterality: N/A;  . RIGHT/LEFT HEART CATH AND CORONARY/GRAFT ANGIOGRAPHY N/A 11/09/2020   Procedure: RIGHT/LEFT HEART CATH AND CORONARY/GRAFT ANGIOGRAPHY;  Surgeon: Nelva Bush, MD;  Location: [place] [facility];  Service: Cardiovascular;  Laterality: N/A;  . TEE WITHOUT CARDIOVERSION N/A 11/24/2019   Procedure: TRANSESOPHAGEAL  ECHOCARDIOGRAM (TEE);  Surgeon: Wonda Olds, MD;  Location: [place];  Service: Open Heart Surgery;  Laterality: N/A;  . TEE WITHOUT CARDIOVERSION N/A 11/26/2019   Procedure: TRANSESOPHAGEAL ECHOCARDIOGRAM (TEE);  Surgeon: Wonda Olds, MD;  Location: [place];  Service: Open Heart Surgery;  Laterality: N/A;    Social History   Tobacco Use  Smoking Status Former Smoker  . Packs/day: 1.00  . Years: 30.00  . Pack years: 30.00  . Types: Cigarettes  . Quit date: 12/20/2019  . Years since quitting: 0.8  Smokeless Tobacco Never Used    Social History   Substance and Sexual Activity  Alcohol Use Yes   Comment: Prior history of excessive intake     No Known Allergies  Current Facility-Administered Medications  Medication Dose Route Frequency Provider Last Rate Last Admin  . acetaminophen (TYLENOL) tablet 650 mg  650 mg Oral Q4H PRN End, Harrell Gave, MD   650 mg at 11/08/20 1938  . albuterol (PROVENTIL) (2.5 MG/3ML) 0.083% nebulizer solution 2.5 mg  2.5 mg Nebulization Q6H PRN End, Harrell Gave, MD   2.5 mg at 11/09/20 2376  . amiodarone (NEXTERONE PREMIX) 360-4.14 MG/200ML-% (1.8 mg/mL) IV infusion  30 mg/hr Intravenous Continuous End, Christopher, MD 16.67 mL/hr at 11/10/20 0620 30 mg/hr at 11/10/20 0620  . aspirin EC tablet 81 mg  81 mg Oral Daily End, Christopher, MD   81 mg at 11/09/20 1100  . Chlorhexidine Gluconate Cloth 2 % PADS 6 each  6 each Topical Daily Elouise Munroe, MD   6 each at 11/09/20 2040  . digoxin (LANOXIN) tablet 0.125 mg  0.125 mg Oral Daily End, Christopher, MD   0.125 mg at 11/09/20 1100  . furosemide (LASIX) injection 80 mg  80 mg Intravenous BID End, Harrell Gave, MD      . gabapentin (NEURONTIN) tablet 600 mg  600 mg Oral TID End, Christopher, MD   600 mg at 11/09/20 2212  . heparin ADULT infusion 100 units/mL (25000 units/267m sodium chloride 0.45%)  1,200 Units/hr Intravenous Continuous Clegg, Amy D, NP      . insulin aspart (novoLOG) injection 0-15  Units  0-15 Units Subcutaneous TID WC End, CHarrell Gave MD   3 Units at 11/09/20 1300  . insulin aspart (novoLOG) injection 0-5 Units  0-5 Units Subcutaneous QHS End, Christopher, MD   2 Units at 11/09/20 2213  . insulin aspart (novoLOG) injection 4 Units  4 Units Subcutaneous TID WC End, Christopher, MD   4 Units at 11/09/20 0900  . insulin glargine (LANTUS) injection 20 Units  20 Units Subcutaneous QHS End, CHarrell Gave MD  20 Units at 11/09/20 2308  . LORazepam (ATIVAN) tablet 0.5-1 mg  0.5-1 mg Oral Q30 min PRN End, Harrell Gave, MD   0.5 mg at 11/09/20 1209  . magnesium oxide (MAG-OX) tablet 400 mg  400 mg Oral Daily End, Christopher, MD   400 mg at 11/09/20 1100  . MEDLINE mouth rinse  15 mL Mouth Rinse BID End, Harrell Gave, MD   15 mL at 11/09/20 2213  . milrinone (PRIMACOR) 20 MG/100 ML (0.2 mg/mL) infusion  0.375 mcg/kg/min Intravenous Continuous Larey Dresser, MD 6.13 mL/hr at 11/10/20 0618 0.25 mcg/kg/min at 11/10/20 0618  . mometasone-formoterol (DULERA) 200-5 MCG/ACT inhaler 2 puff  2 puff Inhalation BID End, Harrell Gave, MD   2 puff at 11/10/20 0843  . norepinephrine (LEVOPHED) 37m in 2547mpremix infusion  0-40 mcg/min Intravenous Titrated End, Christopher, MD      . ondansetron (ZOFRAN) injection 4 mg  4 mg Intravenous Q6H PRN End, ChHarrell GaveMD      . pantoprazole (PROTONIX) EC tablet 40 mg  40 mg Oral Daily End, Christopher, MD   40 mg at 11/09/20 1100  . PERFLUTREN LIPID MICROSPHERE injection SUSP           . rosuvastatin (CRESTOR) tablet 40 mg  40 mg Oral q1800 End, Christopher, MD   40 mg at 11/09/20 2038  . sodium chloride flush (NS) 0.9 % injection 3 mL  3 mL Intravenous Q12H End, Christopher, MD   3 mL at 11/09/20 2214  . spironolactone (ALDACTONE) tablet 12.5 mg  12.5 mg Oral Daily Clegg, Amy D, NP      . traMADol (ULTRAM) tablet 50 mg  50 mg Oral Q6H PRN Coniglio, AmAlcario DroughtMD   50 mg at 11/10/20 0243  . traZODone (DESYREL) tablet 50 mg  50 mg Oral QHS End,  Christopher, MD   50 mg at 11/09/20 2212    Medications Prior to Admission  Medication Sig Dispense Refill Last Dose  . albuterol (PROVENTIL) (2.5 MG/3ML) 0.083% nebulizer solution Take 2.5 mg by nebulization every 6 (six) hours as needed for wheezing or shortness of breath.    11/08/2020 at Unknown time  . aspirin EC 81 MG tablet Take 81 mg by mouth daily. Swallow whole.   11/07/2020 at Unknown time  . carvedilol (COREG) 6.25 MG tablet TAKE 1 TABLET(6.25 MG) BY MOUTH TWICE DAILY WITH A MEAL (Patient taking differently: Take 6.25 mg by mouth 2 (two) times daily with a meal. ) 180 tablet 1 11/07/2020 at 1800  . clopidogrel (PLAVIX) 75 MG tablet TAKE 1 TABLET(75 MG) BY MOUTH DAILY (Patient taking differently: Take 75 mg by mouth once. ) 90 tablet 1 11/07/2020 at Unknown time  . COMBIVENT RESPIMAT 20-100 MCG/ACT AERS respimat Inhale 1 puff into the lungs every 6 (six) hours as needed. 4 g 1 11/08/2020 at Unknown time  . dapagliflozin propanediol (FARXIGA) 5 MG TABS tablet Take 1 tablet (5 mg total) by mouth daily before breakfast. 30 tablet 3 11/07/2020 at Unknown time  . digoxin (LANOXIN) 0.125 MG tablet TAKE 1 TABLET(0.125 MG) BY MOUTH DAILY (Patient taking differently: Take 0.125 mg by mouth daily. ) 90 tablet 1 11/07/2020 at Unknown time  . ENTRESTO 24-26 MG TAKE 1 TABLET BY MOUTH TWICE DAILY 60 tablet 3 11/07/2020 at Unknown time  . furosemide (LASIX) 80 MG tablet Take 0.5-1 tablets (40-80 mg total) by mouth See admin instructions. 8036mn the am 3m3m the evening 45 tablet 2 11/07/2020 at Unknown time  . gabapentin (  NEURONTIN) 600 MG tablet Take 1 tablet (600 mg total) by mouth 3 (three) times daily. 90 tablet 0 11/07/2020 at Unknown time  . magnesium oxide (MAG-OX) 400 (241.3 Mg) MG tablet Take 1 tablet (400 mg total) by mouth daily. 30 tablet 2 11/07/2020 at Unknown time  . metFORMIN (GLUCOPHAGE) 500 MG tablet Take 2 tablets (1,000 mg total) by mouth 2 (two) times daily with a meal. 60 tablet 2  11/07/2020 at Unknown time  . mometasone-formoterol (DULERA) 200-5 MCG/ACT AERO Inhale 2 puffs into the lungs in the morning and at bedtime. 1 each 2 11/07/2020 at Unknown time  . Multiple Vitamin (MULTIVITAMIN WITH MINERALS) TABS tablet Take 1 tablet by mouth daily.   11/07/2020 at Unknown time  . OLANZapine (ZYPREXA) 5 MG tablet Take 5 mg by mouth at bedtime.   11/07/2020 at Unknown time  . pantoprazole (PROTONIX) 40 MG tablet Take 1 tablet (40 mg total) by mouth daily. 30 tablet 2 11/07/2020 at Unknown time  . potassium chloride SA (KLOR-CON) 20 MEQ tablet Take 2 tablets (40 mEq total) by mouth 2 (two) times daily. 120 tablet 2 11/07/2020 at Unknown time  . rosuvastatin (CRESTOR) 40 MG tablet Take 1 tablet (40 mg total) by mouth daily. 30 tablet 3 11/07/2020 at Unknown time  . spironolactone (ALDACTONE) 25 MG tablet Take 0.5 tablets (12.5 mg total) by mouth daily. 15 tablet 6 11/07/2020 at Unknown time  . Tetrahydrozoline HCl (REDNESS RELIEVER EYE DROPS OP) Apply 2 drops to eye daily as needed.   11/07/2020 at Unknown time  . traZODone (DESYREL) 150 MG tablet Take 150 mg by mouth at bedtime.   11/07/2020 at Unknown time  . blood glucose meter kit and supplies KIT Dispense based on patient and insurance preference. Use to check CBG's three times a day. (FOR ICD-9 250.00, 250.01). 1 each 0     Family History  Problem Relation Age of Onset  . Arthritis Other   . Lung disease Other   . Asthma Other   . Diabetes Other   . Heart disease Sister        Tumor?     Review of Systems:   ROS Pertinent items are noted in HPI.     Cardiac Review of Systems: Y or  [    ]= no  Chest Pain [    ]  Resting SOB [   ] Exertional SOB  [  ]  Orthopnea [  ]   Pedal Edema [   ]    Palpitations [  ] Syncope  [  ]   Presyncope [   ]  General Review of Systems: [Y] = yes [  ]=no Constitional: recent weight change [  ]; anorexia [  ]; fatigue [  ]; nausea [  ]; night sweats [  ]; fever [  ]; or chills [  ]                                                                Dental: Last Dentist visit:   Eye : blurred vision [  ]; diplopia [   ]; vision changes [  ];  Amaurosis fugax[  ]; Resp: cough [  ];  wheezing[  ];  hemoptysis[  ]; shortness of breath[  ];  paroxysmal nocturnal dyspnea[  ]; dyspnea on exertion[  ]; or orthopnea[  ];  GI:  gallstones[  ], vomiting[  ];  dysphagia[  ]; melena[  ];  hematochezia [  ]; heartburn[  ];   Hx of  Colonoscopy[  ]; GU: kidney stones [  ]; hematuria[  ];   dysuria [  ];  nocturia[  ];  history of     obstruction [  ]; urinary frequency [  ]             Skin: rash, swelling[  ];, hair loss[  ];  peripheral edema[  ];  or itching[  ]; Musculosketetal: myalgias[  ];  joint swelling[  ];  joint erythema[  ];  joint pain[  ];  back pain[  ];  Heme/Lymph: bruising[  ];  bleeding[  ];  anemia[  ];  Neuro: TIA[  ];  headaches[  ];  stroke[  ];  vertigo[  ];  seizures[  ];   paresthesias[  ];  difficulty walking[  ];  Psych:depression[  ]; anxiety[  ];  Endocrine: diabetes[  ];  thyroid dysfunction[  ];        Physical Exam: BP 105/78   Pulse 92   Temp (!) 97.3 F (36.3 C)   Resp (!) 27   Ht _0  (1.778 m)   Wt 82.4 kg   SpO2 97%   BMI 26.07 kg/m    General appearance: alert and cooperative Neck: JVD - 3 cm above sternal notch, no carotid bruit, supple, symmetrical, trachea midline and thyroid not enlarged, symmetric, no tenderness/mass/nodules Resp: diminished breath sounds bibasilar Cardio: irregularly irregular rhythm GI: soft, non-tender; bowel sounds normal; no masses,  no organomegaly Extremities: edema 2+ Neurologic: Alert and oriented X 3, normal strength and tone. Normal symmetric reflexes. Normal coordination and gait  Diagnostic Studies & Laboratory data:     Recent Radiology Findings:   DG Chest 2 View  Result Date: 11/08/2020 CLINICAL DATA:  Shortness of breath. EXAM: CHEST - 2 VIEW COMPARISON:  September 22, 2020. FINDINGS: Stable  cardiomediastinal silhouette. Status post coronary bypass graft. No pneumothorax or significant pleural effusion is noted. Mild interstitial densities are noted in both lung bases concerning for possible pulmonary edema. Bony thorax is unremarkable. IMPRESSION: Mild bibasilar interstitial densities are noted concerning for possible pulmonary edema. Aortic Atherosclerosis (ICD10-I70.0). Electronically Signed   By: Marijo Conception M.D.   On: 11/08/2020 13:44   CARDIAC CATHETERIZATION  Result Date: 11/09/2020 Conclusions: 1. Severe native coronary artery disease, similar to prior catheterization in 11/2019. 2. Widely patent LIMA-LAD, SVG-D1, and sequential SVG-OM3-rPDA. 3. Severely elevated left and right heart filling pressures. 4. Severely reduced cardiac output/index. 5. Successful placement of leave-in PA catheter via the right internal jugular vein. Recommendations: 1. Transfer to 2H-ICU. 2. Initiate milrinone and escalate diuresis, with ongoing management of acute HFrEF per heart failure team. 3. Secondary prevention of coronary artery disease. Nelva Bush, MD [facility]   DG CHEST PORT 1 VIEW  Result Date: 11/09/2020 CLINICAL DATA:  Shortness of breath EXAM: PORTABLE CHEST 1 VIEW COMPARISON:  11/08/2020 FINDINGS: Two frontal views of the chest demonstrate right internal jugular flow directed central venous catheter. Catheter tip overlies the right main pulmonary artery. Postsurgical changes are seen from median sternotomy. Cardiac silhouette is unremarkable. No airspace disease, effusion, or pneumothorax. Stable central vascular congestion and interstitial prominence consistent with mild fluid overload. No overt edema. IMPRESSION: 1. Right internal jugular flow directed central venous  catheter. 2. Interstitial edema as above. Slight improved volume status since prior exam. Electronically Signed   By: Randa Ngo M.D.   On: 11/09/2020 19:55     I have independently reviewed the above  radiologic studies and discussed with the patient   Recent Lab Findings: Lab Results  Component Value Date   WBC 10.7 (H) 11/09/2020   HGB 14.6 11/09/2020   HCT 43.0 11/09/2020   PLT 195 11/09/2020   GLUCOSE 261 (H) 11/10/2020   CHOL 96 11/09/2020   TRIG 137 11/09/2020   HDL 32 (L) 11/09/2020   LDLCALC 37 11/09/2020   ALT 31 11/26/2019   AST 50 (H) 11/26/2019   NA 132 (L) 11/10/2020   K 4.1 11/10/2020   CL 94 (L) 11/10/2020   CREATININE 1.04 11/10/2020   BUN 27 (H) 11/10/2020   CO2 26 11/10/2020   TSH 1.658 11/20/2019   INR 1.4 (H) 11/26/2019   HGBA1C 9.4 (H) 09/22/2020      Assessment / Plan:      To OR for Impella 5.5 LVAD insertion for medical stabilization and bridge to durable LVAD.     I  spent 20 minutes counseling the patient face to face.   Kalila Adkison Z. Orvan Seen, MD [phone] 11/10/2020 8:50 AM

## 2020-11-10 NOTE — Progress Notes (Signed)
ANTICOAGULATION CONSULT NOTE - Initial Consult  Pharmacy Consult for heparin Indication: atrial fibrillation  No Known Allergies  Patient Measurements: Height: 5\' 10"  (177.8 cm) Weight: 82.4 kg (181 lb 10.5 oz) IBW/kg (Calculated) : 73 Heparin Dosing Weight: 82kg  Vital Signs: Temp: 97.3 F (36.3 C) (12/03 0700) BP: 105/78 (12/03 0700) Pulse Rate: 92 (12/03 0700)  Labs: Recent Labs    11/08/20 1344 11/08/20 1344 11/08/20 1524 11/09/20 0130 11/09/20 0130 11/09/20 0321 11/09/20 1008 11/09/20 1248 11/09/20 1721 11/09/20 1721 11/09/20 1724 11/09/20 1726 11/10/20 0617  HGB 14.3   < >  --  13.2   < >  --   --   --  15.0   < > 14.6 14.6  --   HCT 42.0   < >  --  39.0   < >  --   --   --  44.0  --  43.0 43.0  --   PLT 219  --   --  195  --   --   --   --   --   --   --   --   --   HEPARINUNFRC  --   --   --   --   --  <0.10*  --  0.10*  --   --   --   --   --   CREATININE 0.83  --   --   --   --   --  0.94  --   --   --   --   --  1.04  TROPONINIHS 42*  --  39*  --   --   --   --   --   --   --   --   --   --    < > = values in this interval not displayed.    Estimated Creatinine Clearance: 79 mL/min (by C-G formula based on SCr of 1.04 mg/dL).   Medical History: Past Medical History:  Diagnosis Date   Arthritis    CAD (coronary artery disease)    a. s/p CABG in 11/2019 with LIMA-LAD, SVG-OM1, SVG-PDA and SVG-D1   CHF (congestive heart failure) ([facility])    a. EF < 20% by echo in 11/2019   COPD (chronic obstructive pulmonary disease) ([facility])    Essential hypertension    PAF (paroxysmal atrial fibrillation) ([facility])    Type 2 diabetes mellitus ([facility])      Assessment: 41 yoM admitted with CP and cardiogenic shock. Pt has hx AFib with no AC PTA, pharmacy to start IV heparin. CBC wnl 12/2.  Goal of Therapy:  Heparin level 0.3-0.7 units/ml Monitor platelets by anticoagulation protocol: Yes   Plan:  Heparin 1200 units/h no bolus Check 8h heparin level Daily  heparin level and CBC   14/2, PharmD, [place], [facility] Clinical Pharmacist [phone] Please check AMION for all [facility] Pharmacy numbers 11/10/2020

## 2020-11-10 NOTE — Progress Notes (Signed)
Initial Nutrition Assessment  DOCUMENTATION CODES:   Not applicable  INTERVENTION:   - Once diet advanced, Ensure Enlive po BID, each supplement provides 350 kcal and 20 grams of protein  - MVI with minerals daily  NUTRITION DIAGNOSIS:   Increased nutrient needs related to chronic illness (CHF, COPD) as evidenced by estimated needs.  GOAL:   Patient will meet greater than or equal to 90% of their needs  MONITOR:   PO intake, Supplement acceptance, Labs, Weight trends, I & O's  REASON FOR ASSESSMENT:   Consult LVAD Eval  ASSESSMENT:   59 year old male who presented to the ED on 12/01 with chest pain. PMH of CAD s/p CABG December 2020, CHF, HTN, HLD, EtOH and cocaine abuse (substance-free over the last year), T2DM, COPD.   12/02 - right/left heart cath  Pt in OR for Impella placement at time of RD visit. Unable to obtain diet and weight history at this time. Will re-attempt at follow-up.  Pt is currently NPO, previously on Heart Healthy diet with no meal completions recorded. RD will order oral nutrition supplements to aid pt in meeting kcal and protein needs once diet advanced.  Reviewed weight history in chart. Pt's weight has been trending up over the last 9 months from 74.8 kg in March 2021 to 82.4 kg today. Suspect weight gain related to fluid status given CHF diagnosis. Will continue to monitor trends with diuresis. Unable to assess for malnutrition at this time. Will plan to completed NFPE at follow-up.  Medications reviewed and include: lasix, SSI, novolog 4 units TID with meals, lantus 20 units daily, magnesium oxide, protonix, spironolactone, amiodarone, heparin, milrinone  Labs reviewed: sodium 132, BUN 27 CBG's: 143-241 x 24 hours  UOP: 1575 ml x 24 hours I/O's: -1.1 L since admit  NUTRITION - FOCUSED PHYSICAL EXAM:  Unable to complete at this time. Pt in OR for Impella.  Diet Order:   Diet Order            Diet NPO time specified  Diet effective now                  EDUCATION NEEDS:   Not appropriate for education at this time  Skin:  Skin Assessment: Reviewed RN Assessment  Last BM:  11/08/20  Height:   Ht Readings from Last 1 Encounters:  11/08/20 5\' 10"  (1.778 m)    Weight:   Wt Readings from Last 1 Encounters:  11/10/20 82.4 kg    Ideal Body Weight:  75.5 kg  BMI:  Body mass index is 26.07 kg/m.  Estimated Nutritional Needs:   Kcal:  2300-2500  Protein:  115-130 grams  Fluid:  2.0 L/day    14/03/21, MS, RD, LDN Inpatient Clinical Dietitian Please see AMiON for contact information.

## 2020-11-10 NOTE — Progress Notes (Signed)
  Echocardiogram Echocardiogram Transesophageal has been performed.  Gerda Diss 11/10/2020, 5:21 PM

## 2020-11-10 NOTE — Anesthesia Procedure Notes (Deleted)
Performed by: Aston Lieske B, CRNA       

## 2020-11-10 NOTE — Progress Notes (Addendum)
[facility] EDUCATION NOTE:                VAD evaluation consent reviewed and signed by Kevin Underwood and designated caregiver Kevin Underwood. Initial VAD teaching completed with pt and caregiver.   VAD educational packet including "Understanding Your Options with Advanced Heart Failure", "[employer] Patient Agreement for VAD Evaluation and Potential Implantation" consent, and [employer] "Living a More Active Life" HM III booklet", "[place] HM III Patient Education", " Mechanical Circulatory Support Program", and "Decision Aids for Left Ventricular Assist Device" reviewed in detail and left at bedside for continued reference.    All questions answered regarding VAD implant, hospital stay, and what to expect when discharged home living with a heart pump. Pt identified Kevin Underwood as his primary caregiver.  Explained need for 24/7 care when pt is discharged home due to sternal precautions, adaptation to living on support, emotional support, consistent and meticulous exit site care and management, medication adherence and high volume of follow up visits with the VAD Clinic after discharge; both pt and caregiver verbalized understanding of above.   Explained that LVAD can be implanted for two indications in the setting of advanced left ventricular heart failure treatment:  1. Bridge to transplant - used for patients who cannot safely wait for heart transplant without this device.  Or    2. Destination therapy - used for patients until end of life or recovery of heart function.  Patient and caregiver(s) acknowledge that the indication at this point in time for LVAD therapy would be for destination therapy due to social issues and uncontrolled diabetes.   Provided brief equipment overview and demonstration with HeartMate III training loop including discussion on the following:   a) power module   b) system controller   c) universal Magazine features editor   d) battery clips   e) Batteries   f)  Perc lock    g) Percutaneous lead   Reviewed and supplied a copy of home inspection check list stressing that only three pronged grounded power outlets can be used for VAD equipment. Pt confirmed home has electrical outlets that will support the equipment along with access working telephone.  Identified the following lifestyle modifications while living on MCS:    1. No driving for at least three months and then only if doctor gives permission to do so.   2. No tub baths while pump implanted, and shower only when doctor gives permission.   3. No swimming or submersion in water while implanted with pump.   4. No contact sports or engaging in jumping activities.   5. Always have a backup controller, charged spare batteries, and battery clips nearby at all times in case of emergency.   6. Call the doctor or hospital contact person if any change in how the pump sounds, feels, or works.   7. Plan to sleep only when connected to the power module.   8. Do not sleep on your stomach.   9. Keep a backup system controller, charged batteries, battery clips, and flashlight near you during sleep in case of electrical power outage.   10. Exit site care including dressing changes, monitoring for infection, and importance of keeping percutaneous lead stabilized at all times.     Extended the option to have one of our current patients and caregiver(s) come to talk with them about living on support to assist with decision making. Both are open to meeting one of our patients.  Reviewed pictures of VAD drive  line, site care, dressing changes, and drive line stabilization including securement attachment device and abdominal binder. Discussed with pt and family that they will be required to purchase dressing supplies as long as patient has the VAD in place.   Reinforced need for 24 hour/7 day week caregivers; pt designated Kevin Underwood and as caregiver. He will also need to abide by sternal precautions with no lifting >10lbs, pushing,  pulling and will need assistance with adapting to new life style with VAD equipment and care.   Intermacs patient survival statistics through June 2021 reviewed with patient and caregiver as follows:                                                The patient understands that from this discussion it does not mean that they will receive the device, but that depends on an extensive evaluation process. The patient is aware of the fact that if at anytime they want to stop the evaluation process they can.  All questions have been answered at this time and contact information was provided should they encounter any further questions.  They are both agreeable at this time to the evaluation process and will move forward.    Alyce Pagan RN VAD Coordinator  Office: [phone]  24/7 Pager: [phone]

## 2020-11-10 NOTE — Transfer of Care (Signed)
Immediate Anesthesia Transfer of Care Note  Patient: Kevin Underwood  Procedure(s) Performed: PLACEMENT OF IMPELLA 5.5 LEFT VENTRICULAR ASSIST DEVICE VIA  LEFT AVILLARY ARTERY (Left ) TRANSESOPHAGEAL ECHOCARDIOGRAM (TEE) (N/A )  Patient Location: ICU  Anesthesia Type:General  Level of Consciousness: awake, alert , oriented and patient cooperative  Airway & Oxygen Therapy: Patient Spontanous Breathing and Patient connected to nasal cannula oxygen  Post-op Assessment: Report given to RN and Post -op Vital signs reviewed and stable  Post vital signs: Reviewed and stable  Last Vitals:  Vitals Value Taken Time  BP    Temp    Pulse    Resp    SpO2      Last Pain:  Vitals:   11/10/20 1500  TempSrc:   PainSc: 0-No pain      Patients Stated Pain Goal: 0 (11/10/20 0243)  Complications: No complications documented.

## 2020-11-10 NOTE — Brief Op Note (Signed)
11/10/2020  6:34 PM  PATIENT:  Kevin Underwood  59 y.o. male  PRE-OPERATIVE DIAGNOSIS:  CARDIOGENIC SHOCK  POST-OPERATIVE DIAGNOSIS:  CARDIOGENIC SHOCK  PROCEDURE:  Procedure(s): PLACEMENT OF IMPELLA 5.5 LEFT VENTRICULAR ASSIST DEVICE VIA  LEFT AVILLARY ARTERY (Left) TRANSESOPHAGEAL ECHOCARDIOGRAM (TEE) (N/A)  SURGEON:  Surgeon(s) and Role:    * [name], MD - Primary  PHYSICIAN ASSISTANT:   ASSISTANTS: staff   ANESTHESIA:   general  EBL:  Per anes  BLOOD ADMINISTERED:none  DRAINS: none   LOCAL MEDICATIONS USED:  NONE  SPECIMEN:  No Specimen  DISPOSITION OF SPECIMEN:  N/A  COUNTS:  YES  TOURNIQUET:  * No tourniquets in log *  DICTATION: .Note written in EPIC  PLAN OF CARE: Admit to inpatient   PATIENT DISPOSITION:  ICU - extubated and stable.   Delay start of Pharmacological VTE agent (>24hrs) due to surgical blood loss or risk of bleeding: yes

## 2020-11-10 NOTE — Anesthesia Preprocedure Evaluation (Addendum)
Anesthesia Evaluation  Patient identified by MRN, date of birth, ID band Patient awake    Reviewed: Allergy & Precautions, NPO status , Patient's Chart, lab work & pertinent test results  Airway Mallampati: II  TM Distance: >3 FB Neck ROM: Full    Dental  (+) Poor Dentition, Missing, Loose   Pulmonary COPD,  COPD inhaler, former smoker,    Pulmonary exam normal breath sounds clear to auscultation       Cardiovascular hypertension, Pt. on home beta blockers + angina + CAD, + CABG and +CHF  Normal cardiovascular exam+ dysrhythmias Atrial Fibrillation  Rhythm:Regular Rate:Normal  ECHO: 1. DEfinity contrast reveals complex filling defects in the apex - likely apical thrombi. . Left ventricular ejection fraction, by estimation, is <20%. The left ventricle has severely decreased function. The left ventricle demonstrates global hypokinesis. The left ventricular internal cavity size was mildly to moderately dilated. Left ventricular diastolic parameters are consistent with Grade I diastolic dysfunction (impaired relaxation). 2. Right ventricular systolic function is normal. The right ventricular size is normal. There is mildly elevated pulmonary artery systolic pressure. 3. Left atrial size was moderately dilated. 4. Right atrial size was moderately dilated. 5. The mitral valve is normal in structure. Moderate to severe mitral valve regurgitation. 6. Tricuspid valve regurgitation is mild to moderate. 7. The aortic valve is normal in structure. Aortic valve regurgitation is not visualized. No aortic stenosis is present. 8. Aortic dilatation noted. There is mild to moderate dilatation of the ascending aorta, measuring 39 mm. 9. The inferior vena cava is dilated in size with >50% respiratory variability, suggesting right atrial pressure of 8 mmHg.   Neuro/Psych negative neurological ROS  negative psych ROS   GI/Hepatic negative GI ROS,  Neg liver ROS,   Endo/Other  diabetes, Oral Hypoglycemic Agents  Renal/GU negative Renal ROS     Musculoskeletal  (+) Arthritis ,   Abdominal   Peds  Hematology HLD   Anesthesia Other Findings CARDIOGENIC SHOCK  Reproductive/Obstetrics                            Anesthesia Physical Anesthesia Plan  ASA: IV  Anesthesia Plan: General   Post-op Pain Management:    Induction: Intravenous  PONV Risk Score and Plan: 2 and Ondansetron, Dexamethasone and Treatment may vary due to age or medical condition  Airway Management Planned: Oral ETT  Additional Equipment: Arterial line and TEE  Intra-op Plan:   Post-operative Plan: Extubation in OR  Informed Consent: I have reviewed the patients History and Physical, chart, labs and discussed the procedure including the risks, benefits and alternatives for the proposed anesthesia with the patient or authorized representative who has indicated his/her understanding and acceptance.     Dental advisory given  Plan Discussed with: CRNA  Anesthesia Plan Comments: (TEE for intraoperative monitoring only)      Anesthesia Quick Evaluation

## 2020-11-10 NOTE — Anesthesia Procedure Notes (Signed)
Arterial Line Insertion Start/End12/02/2020 4:37 PM, 11/10/2020 4:37 PM Performed by: Modena Morrow, CRNA, CRNA  Patient location: Pre-op. Preanesthetic checklist: patient identified, IV checked, site marked, risks and benefits discussed, surgical consent, monitors and equipment checked, pre-op evaluation, timeout performed and anesthesia consent Lidocaine 1% used for infiltration Right, radial was placed Catheter size: 20 Fr Hand hygiene performed  and maximum sterile barriers used   Attempts: 1 Procedure performed without using ultrasound guided technique. Following insertion, dressing applied and Biopatch. Post procedure assessment: normal and unchanged  Patient tolerated the procedure well with no immediate complications.

## 2020-11-10 NOTE — Progress Notes (Signed)
  Echocardiogram 2D Echocardiogram has been performed.  Tye Savoy 11/10/2020, 8:49 AM

## 2020-11-10 NOTE — Progress Notes (Addendum)
Advanced Heart Failure Rounding Note  PCP-Cardiologist: Kevin Dell, MD   Subjective:    12/2 Had cath as noted below. CO-OX 39%, started on milrinone. Started IV lasix. Negative 1.3 liters.   Todays CO-OX is 48%   Denies SOB. Denies chest pain.   Swan #s  PA 39/6 CVP 10 CO 3 CI 1.5   RHC/LHC 11/09/20  RA 20 PCWP 30-35 PA Sat 40% CO 3 CI 1.5.  1. Severe native coronary artery disease, similar to prior catheterization in 11/2019.  2. Widely patent LIMA-LAD, SVG-D1, and sequential SVG-OM3-rPDA. 3. Severely elevated left and right heart filling pressures. 4. Severely reduced cardiac output/index. 5. Successful placement of leave-in PA catheter via the right internal jugular vein.   Objective:   Weight Range: 82.4 kg Body mass index is 26.07 kg/m.   Vital Signs:   Temp:  [96.8 F (36 C)-97.9 F (36.6 C)] 97.3 F (36.3 C) (12/03 0600) Pulse Rate:  [0-159] 87 (12/03 0600) Resp:  [0-47] 19 (12/03 0600) BP: (90-131)/(56-93) 99/69 (12/03 0600) SpO2:  [0 %-100 %] 98 % (12/03 0600) Weight:  [82.4 kg] 82.4 kg (12/03 0500) Last BM Date: 11/08/20  Weight change: Filed Weights   11/08/20 1308 11/09/20 0426 11/10/20 0500  Weight: 77.1 kg 81.7 kg 82.4 kg    Intake/Output:   Intake/Output Summary (Last 24 hours) at 11/10/2020 0721 Last data filed at 11/09/2020 2300 Gross per 24 hour  Intake 248.33 ml  Output 1575 ml  Net -1326.67 ml      Physical Exam   CVP 10-11  General:  No resp difficulty HEENT: Normal Neck: Supple. JVP 10-11 . Carotids 2+ bilat; no bruits. No lymphadenopathy or thyromegaly appreciated.RIJ swan Cor: PMI nondisplaced. Irregular rate & rhythm. No rubs, gallops or murmurs. Lungs: Clear Abdomen: Soft, nontender, nondistended. No hepatosplenomegaly. No bruits or masses. Good bowel sounds. Extremities: No cyanosis, clubbing, rash, edema Neuro: Alert & orientedx3, cranial nerves grossly intact. moves all 4 extremities w/o difficulty.  Affect pleasant   Telemetry   SR with frequent PACs 90s will get EKG    EKG    N/a   Labs    CBC Recent Labs    11/08/20 1344 11/08/20 1344 11/09/20 0130 11/09/20 1721 11/09/20 1724 11/09/20 1726  WBC 10.8*  --  10.7*  --   --   --   HGB 14.3   < > 13.2   < > 14.6 14.6  HCT 42.0   < > 39.0   < > 43.0 43.0  MCV 84.2  --  83.3  --   --   --   PLT 219  --  195  --   --   --    < > = values in this interval not displayed.   Basic Metabolic Panel Recent Labs    09/32/67 1008 11/09/20 1721 11/09/20 1726 11/10/20 0617  NA 135   < > 136 132*  K 3.6   < > 3.6 4.1  CL 98  --   --  94*  CO2 26  --   --  26  GLUCOSE 137*  --   --  261*  BUN 23*  --   --  27*  CREATININE 0.94  --   --  1.04  CALCIUM 9.5  --   --  8.4*   < > = values in this interval not displayed.   Liver Function Tests No results for input(s): AST, ALT, ALKPHOS, BILITOT, PROT, ALBUMIN in the last  72 hours. No results for input(s): LIPASE, AMYLASE in the last 72 hours. Cardiac Enzymes No results for input(s): CKTOTAL, CKMB, CKMBINDEX, TROPONINI in the last 72 hours.  BNP: BNP (last 3 results) Recent Labs    11/25/19 0500 09/22/20 0348 11/08/20 1344  BNP 596.1* 939.0* 910.0*    ProBNP (last 3 results) No results for input(s): PROBNP in the last 8760 hours.   D-Dimer No results for input(s): DDIMER in the last 72 hours. Hemoglobin A1C No results for input(s): HGBA1C in the last 72 hours. Fasting Lipid Panel Recent Labs    11/09/20 0130  CHOL 96  HDL 32*  LDLCALC 37  TRIG 408  CHOLHDL 3.0   Thyroid Function Tests No results for input(s): TSH, T4TOTAL, T3FREE, THYROIDAB in the last 72 hours.  Invalid input(s): FREET3  Other results:   Imaging    CARDIAC CATHETERIZATION  Result Date: 11/09/2020 Conclusions: 1. Severe native coronary artery disease, similar to prior catheterization in 11/2019. 2. Widely patent LIMA-LAD, SVG-D1, and sequential SVG-OM3-rPDA. 3. Severely  elevated left and right heart filling pressures. 4. Severely reduced cardiac output/index. 5. Successful placement of leave-in PA catheter via the right internal jugular vein. Recommendations: 1. Transfer to 2H-ICU. 2. Initiate milrinone and escalate diuresis, with ongoing management of acute HFrEF per heart failure team. 3. Secondary prevention of coronary artery disease. Kevin Kendall, MD [facility]   DG CHEST PORT 1 VIEW  Result Date: 11/09/2020 CLINICAL DATA:  Shortness of breath EXAM: PORTABLE CHEST 1 VIEW COMPARISON:  11/08/2020 FINDINGS: Two frontal views of the chest demonstrate right internal jugular flow directed central venous catheter. Catheter tip overlies the right main pulmonary artery. Postsurgical changes are seen from median sternotomy. Cardiac silhouette is unremarkable. No airspace disease, effusion, or pneumothorax. Stable central vascular congestion and interstitial prominence consistent with mild fluid overload. No overt edema. IMPRESSION: 1. Right internal jugular flow directed central venous catheter. 2. Interstitial edema as above. Slight improved volume status since prior exam. Electronically Signed   By: Sharlet Salina M.D.   On: 11/09/2020 19:55      Medications:     Scheduled Medications: . aspirin EC  81 mg Oral Daily  . Chlorhexidine Gluconate Cloth  6 each Topical Daily  . clopidogrel  75 mg Oral Daily  . digoxin  0.125 mg Oral Daily  . furosemide  80 mg Intravenous BID  . gabapentin  600 mg Oral TID  . insulin aspart  0-15 Units Subcutaneous TID WC  . insulin aspart  0-5 Units Subcutaneous QHS  . insulin aspart  4 Units Subcutaneous TID WC  . insulin glargine  20 Units Subcutaneous QHS  . magnesium oxide  400 mg Oral Daily  . mouth rinse  15 mL Mouth Rinse BID  . mometasone-formoterol  2 puff Inhalation BID  . pantoprazole  40 mg Oral Daily  . rosuvastatin  40 mg Oral q1800  . sodium chloride flush  3 mL Intravenous Q12H  . spironolactone  12.5 mg  Oral Daily  . traZODone  50 mg Oral QHS     Infusions: . amiodarone 30 mg/hr (11/10/20 0620)  . milrinone 0.25 mcg/kg/min (11/10/20 0618)  . 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 type 2 diabetes mellitus, COPD, and hypertension.   Assessment/Plan   1. Acute on chronic systolic HF with biventricular HF -> cardiogenic shock - due to iCM. Cath with widely patent LIMA-LAD, SVG-D1, and sequential SVG-OM3-rPDAsevere coronary disease, CI 1.5, CO-OX 49%.  - echo 10/21 EF 20-25% mild RV HK - Yesterday CO-OX 39%. Started on milrinone, todays CO-OX is 49%. Index remains low--> 1.4. -Increase milrinone 0.374mcg.  Check CO-OX at 1200.  - Off bb with cardiogenic shock.  - continue dig - CVP 10-11. Continue IV lasix.  - Continue 12.5 mg spiro daily.  - needs w/u for advanced therapies. May need mechanical support if with poor hemodynamics. Increasing milrinone as noted above.   Renal function stable.  2. CAD s/p CABG 12/20 - c/b shock. Required Impella support - grafts patent on cath 11/09/20 3. PAF - currently in NSR with frequent PACs on IV amio - Restart heparin.  4. DM2 - cover with SSI 5. Hyponatremia - restrict free water   Repeat CO-OX at 1200 on higher dose of milrinone. May need mechanical support.  Follow closely.   Length of Stay: 2  Tonye Becket, NP  11/10/2020, 7:21 AM  Advanced Heart Failure Team Pager [phone] (M-F; 7a - 4p)  Please contact [facility] for night-coverage after hours (4p -7a ) and weekends on [email or web address]  Patient seen with NP, agree with the above note.   Cardiac index remains low today  off Swan, 1.2.  Co-ox 49%. CVP 10-11, creatinine stable.   He is on milrinone 0.25 + amiodarone gtt.   General: NAD Neck: JVP 10-12 cm, no thyromegaly or thyroid nodule.  Lungs: Clear to auscultation bilaterally with normal respiratory effort. CV: Lateral PMI.  Heart regular S1/S2, no S3/S4, no murmur.  No peripheral edema.   Abdomen: Soft, nontender, no hepatosplenomegaly, no distention.  Skin: Intact without lesions or rashes.  Neurologic: Alert and oriented x 3.  Psych: Normal affect. Extremities: No clubbing or cyanosis.  HEENT: Normal.   1. Acute on chronic systolic CHF with cardiogenic shock: Echo 10/21 with EF 20-25%, mildly decreased RV function (persistently low). LHC/RHC this admission with patent grafts, low output. Suspect mixed ischemic/nonischemic cardiomyopathy (prior heavy ETOH and drugs as well as CAD).  No ETOH, drugs, smoking for about 1 year since CABG in 12/20.  Cardiac output remains low this morning with mildly elevated CVP.  He is tired.  - Increase milrinone to 0.375.  - Continue Lasix 80 mg IV bid.  - Continue digoxin 0.125 daily and spironolactone 12.5 daily.  - Discussed placement of Impella 5.5 catheter for LV support today with Dr. Vickey Sages, will make NPO.   - LVAD workup, I think he will be candidate.  Will ask LVAD coordinator to see today.  2. CAD: S/p CABG 12/20.  LHC this admission with patent grafts, no target for intervention.  - Continue ASA and statin.  - Will stop Plavix with possible future LVAD.  3. Atrial fibrillation: Paroxysmal.  S/p Maze and LA appendage clip with CABG in 12/20.  In NSR with PACs currently.  - Continue amiodarone gtt.  - Start heparin gtt.  4. Type 2 diabetes: Insulin, check hgbA1c.  5. Prior ETOH, drugs: None x 1 year.   CRITICAL CARE Performed by: Marca Ancona  Total critical care time: 40 minutes  Critical care time was exclusive of separately billable procedures and treating other patients.  Critical care was  necessary to treat or prevent  imminent or life-threatening deterioration.  Critical care was time spent personally by me on the following activities: development of treatment plan with patient and/or surrogate as well as nursing, discussions with consultants, evaluation of patient's response to treatment, examination of patient, obtaining history from patient or surrogate, ordering and performing treatments and interventions, ordering and review of laboratory studies, ordering and review of radiographic studies, pulse oximetry and re-evaluation of patient's condition.  Marca Ancona 11/10/2020 8:09 AM

## 2020-11-10 NOTE — Progress Notes (Signed)
CSW met at bedside with patient and his wife. Patient reports he has Medicare and Medicaid insurance and disability income and denies any concerns with medical bills or medications. Patient is married and has a 59yo son who also resides in the home. Patient spoke at length about his healthcare journey and lack of ability to even walk to the mailbox. His wife does not work and would be the 24/7 caregiver in addition his Mother and sister live nearby and are also supportive. He denies any current [facility] needs and admits to past tobacco and alcohol use although states he has neither in the past year.His wife admitted that she still smokes outside of the apartment.  He denies any financial issues at this time. CSW will complete a full LVAD psychosocial assessment pending further discussion with team next week. Raquel Sarna, [place], [place]

## 2020-11-11 ENCOUNTER — Encounter (HOSPITAL_COMMUNITY): Payer: Self-pay | Admitting: Cardiology

## 2020-11-11 DIAGNOSIS — Z95811 Presence of heart assist device: Secondary | ICD-10-CM

## 2020-11-11 DIAGNOSIS — R570 Cardiogenic shock: Secondary | ICD-10-CM | POA: Diagnosis not present

## 2020-11-11 DIAGNOSIS — Z7189 Other specified counseling: Secondary | ICD-10-CM

## 2020-11-11 DIAGNOSIS — Z515 Encounter for palliative care: Secondary | ICD-10-CM

## 2020-11-11 LAB — GLUCOSE, CAPILLARY
Glucose-Capillary: 172 mg/dL — ABNORMAL HIGH (ref 70–99)
Glucose-Capillary: 216 mg/dL — ABNORMAL HIGH (ref 70–99)
Glucose-Capillary: 229 mg/dL — ABNORMAL HIGH (ref 70–99)
Glucose-Capillary: 234 mg/dL — ABNORMAL HIGH (ref 70–99)
Glucose-Capillary: 274 mg/dL — ABNORMAL HIGH (ref 70–99)

## 2020-11-11 LAB — URINALYSIS, ROUTINE W REFLEX MICROSCOPIC
Bilirubin Urine: NEGATIVE
Glucose, UA: NEGATIVE mg/dL
Ketones, ur: NEGATIVE mg/dL
Leukocytes,Ua: NEGATIVE
Nitrite: NEGATIVE
Protein, ur: NEGATIVE mg/dL
RBC / HPF: >50 RBC/hpf — ABNORMAL HIGH (ref 0–5)
Specific Gravity, Urine: 1.01 (ref 1.005–1.030)
pH: 5 (ref 5.0–8.0)

## 2020-11-11 LAB — BASIC METABOLIC PANEL
Anion gap: 13 (ref 5–15)
BUN: 21 mg/dL — ABNORMAL HIGH (ref 6–20)
CO2: 23 mmol/L (ref 22–32)
Calcium: 8.4 mg/dL — ABNORMAL LOW (ref 8.9–10.3)
Chloride: 95 mmol/L — ABNORMAL LOW (ref 98–111)
Creatinine, Ser: 0.9 mg/dL (ref 0.61–1.24)
GFR, Estimated: >60 mL/min (ref >60)
Glucose, Bld: 232 mg/dL — ABNORMAL HIGH (ref 70–99)
Potassium: 4 mmol/L (ref 3.5–5.1)
Sodium: 131 mmol/L — ABNORMAL LOW (ref 135–145)

## 2020-11-11 LAB — TSH: TSH: 1.053 u[IU]/mL (ref 0.350–4.500)

## 2020-11-11 LAB — HEPARIN LEVEL (UNFRACTIONATED)
Heparin Unfractionated: <0.1 IU/mL — ABNORMAL LOW (ref 0.30–0.70)
Heparin Unfractionated: <0.1 IU/mL — ABNORMAL LOW (ref 0.30–0.70)

## 2020-11-11 LAB — COOXEMETRY PANEL
Carboxyhemoglobin: 1.5 % (ref 0.5–1.5)
Methemoglobin: 1.1 % (ref 0.0–1.5)
O2 Saturation: 61.7 %
Total hemoglobin: 11.1 g/dL — ABNORMAL LOW (ref 12.0–16.0)

## 2020-11-11 LAB — ANTITHROMBIN III: AntiThromb III Func: 82 % (ref 75–120)

## 2020-11-11 LAB — HEPATITIS B SURFACE ANTIGEN: Hepatitis B Surface Ag: NONREACTIVE

## 2020-11-11 LAB — CBC
HCT: 34.1 % — ABNORMAL LOW (ref 39.0–52.0)
Hemoglobin: 11.3 g/dL — ABNORMAL LOW (ref 13.0–17.0)
MCH: 28 pg (ref 26.0–34.0)
MCHC: 33.1 g/dL (ref 30.0–36.0)
MCV: 84.6 fL (ref 80.0–100.0)
Platelets: 156 10*3/uL (ref 150–400)
RBC: 4.03 MIL/uL — ABNORMAL LOW (ref 4.22–5.81)
RDW: 14.5 % (ref 11.5–15.5)
WBC: 9.6 10*3/uL (ref 4.0–10.5)
nRBC: 0 % (ref 0.0–0.2)

## 2020-11-11 LAB — PREALBUMIN: Prealbumin: 17.9 mg/dL — ABNORMAL LOW (ref 18–38)

## 2020-11-11 LAB — SURGICAL PCR SCREEN
MRSA, PCR: NEGATIVE
Staphylococcus aureus: NEGATIVE

## 2020-11-11 LAB — LACTATE DEHYDROGENASE: LDH: 210 U/L — ABNORMAL HIGH (ref 98–192)

## 2020-11-11 LAB — HEPATITIS B CORE ANTIBODY, TOTAL: Hep B Core Total Ab: NONREACTIVE

## 2020-11-11 LAB — PSA
Prostatic Specific Antigen: 2.22 ng/mL (ref 0.00–4.00)
Prostatic Specific Antigen: 1.84 ng/mL (ref 0.00–4.00)

## 2020-11-11 LAB — MAGNESIUM: Magnesium: 2 mg/dL (ref 1.7–2.4)

## 2020-11-11 LAB — HEPATITIS C ANTIBODY: HCV Ab: NONREACTIVE

## 2020-11-11 LAB — T4, FREE: Free T4: 1.75 ng/dL — ABNORMAL HIGH (ref 0.61–1.12)

## 2020-11-11 MED ORDER — MUPIROCIN 2 % EX OINT
1.0000 "application " | TOPICAL_OINTMENT | Freq: Two times a day (BID) | CUTANEOUS | Status: AC
  Administered 2020-11-11 – 2020-11-16 (×9): 1 via NASAL
  Filled 2020-11-11 (×3): qty 22

## 2020-11-11 MED ORDER — HEPARIN (PORCINE) 25000 UT/250ML-% IV SOLN
900.0000 [IU]/h | INTRAVENOUS | Status: DC
  Administered 2020-11-11: 650 [IU]/h via INTRAVENOUS
  Filled 2020-11-11: qty 250

## 2020-11-11 MED ORDER — GLUCERNA SHAKE PO LIQD
237.0000 mL | Freq: Two times a day (BID) | ORAL | Status: DC
  Administered 2020-11-12 – 2020-11-14 (×5): 237 mL via ORAL

## 2020-11-11 MED ORDER — LORAZEPAM 1 MG PO TABS: 1.0000 mg | ORAL_TABLET | Freq: Every evening | ORAL | Status: DC | PRN

## 2020-11-11 NOTE — Anesthesia Postprocedure Evaluation (Signed)
Anesthesia Post Note  Patient: Kevin Underwood  Procedure(s) Performed: PLACEMENT OF IMPELLA 5.5 LEFT VENTRICULAR ASSIST DEVICE VIA  LEFT AVILLARY ARTERY (Left ) TRANSESOPHAGEAL ECHOCARDIOGRAM (TEE) (N/A )     Patient location during evaluation: ICU Anesthesia Type: General Level of consciousness: awake and alert Pain management: pain level controlled Vital Signs Assessment: post-procedure vital signs reviewed and stable Respiratory status: spontaneous breathing, nonlabored ventilation, respiratory function stable and patient connected to nasal cannula oxygen Cardiovascular status: blood pressure returned to baseline and stable Postop Assessment: no apparent nausea or vomiting Anesthetic complications: no   No complications documented.  Last Vitals:  Vitals:   11/11/20 0700 11/11/20 0734  BP:    Pulse: 79   Resp: 16   Temp: (!) 35.8 C   SpO2: 99% 99%    Last Pain:  Vitals:   11/11/20 0400  TempSrc: Core  PainSc: Asleep                 Yonas Bunda P Kree Rafter

## 2020-11-11 NOTE — Progress Notes (Signed)
Advanced Heart Failure Rounding Note  PCP-Cardiologist: Nona Dell, MD   Subjective:    12/2 Had cath as noted below with intact revascularization. CO-OX 39%, started on milrinone. Started IV lasix. Negative 1.3 liters.  12/3 Impella 5.5 placed   Lying in bed. Denies CP or SOB. Shoulder sore at Impella site. Feels much better with Impella support. Wants to pursue LVAD.   On milrinone 0.375. Off NE/VP. Co-ox 62%   [place] CVP 9 PA 49/22 Thermo 7.2/3.6 PAPi 3.0  Impella 5.5 P-7 Flow 3.6 L Waveforms ok LDH 210    RHC/LHC 11/09/20  RA 20 PCWP 30-35 PA Sat 40% CO 3 CI 1.5.  1. Severe native coronary artery disease, similar to prior catheterization in 11/2019.  2. Widely patent LIMA-LAD, SVG-D1, and sequential SVG-OM3-rPDA. 3. Severely elevated left and right heart filling pressures. 4. Severely reduced cardiac output/index. 5. Successful placement of leave-in PA catheter via the right internal jugular vein.   Objective:   Weight Range: 85.7 kg Body mass index is 27.11 kg/m.   Vital Signs:   Temp:  [96.1 F (35.6 C)-98.1 F (36.7 C)] 97.5 F (36.4 C) (12/04 1300) Pulse Rate:  [73-98] 82 (12/04 1300) Resp:  [12-24] 20 (12/04 1300) SpO2:  [88 %-100 %] 96 % (12/04 1300) Arterial Line BP: (103-149)/(66-92) 108/67 (12/04 1300) Weight:  [85.7 kg] 85.7 kg (12/04 0300) Last BM Date: 11/08/20  Weight change: Filed Weights   11/09/20 0426 11/10/20 0500 11/11/20 0300  Weight: 81.7 kg 82.4 kg 85.7 kg    Intake/Output:   Intake/Output Summary (Last 24 hours) at 11/11/2020 1431 Last data filed at 11/11/2020 0900 Gross per 24 hour  Intake 2439.98 ml  Output 1360 ml  Net 1079.98 ml      Physical Exam   General:  Lying in bed. No resp difficulty HEENT: normal Neck: supple. RIJ swan. Carotids 2+ bilat; no bruits. No lymphadenopathy or thryomegaly appreciated. Cor: PMI nondisplaced. Regular rate & rhythm. No rubs, gallops or murmurs. L chest Impella site  ok. Small hematoma Lungs: clear Abdomen: soft, nontender, nondistended. No hepatosplenomegaly. No bruits or masses. Good bowel sounds. Extremities: no cyanosis, clubbing, rash, 1+ edema Neuro: alert & orientedx3, cranial nerves grossly intact. moves all 4 extremities w/o difficulty. Affect pleasant  Telemetry   SR 90s Personally reviewed   Labs    CBC Recent Labs    11/10/20 2224 11/11/20 0503  WBC 13.3* 9.6  NEUTROABS 12.2*  --   HGB 12.3* 11.3*  HCT 36.6* 34.1*  MCV 84.3 84.6  PLT 177 156   Basic Metabolic Panel Recent Labs    33/35/45 2224 11/11/20 0503  NA 132* 131*  K 3.9 4.0  CL 97* 95*  CO2 22 23  GLUCOSE 211* 232*  BUN 23* 21*  CREATININE 1.08 0.90  CALCIUM 8.4* 8.4*  MG 2.0 2.0   Liver Function Tests Recent Labs    11/10/20 2224  AST 21  ALT 26  ALKPHOS 81  BILITOT 1.1  PROT 5.8*  ALBUMIN 3.0*   No results for input(s): LIPASE, AMYLASE in the last 72 hours. Cardiac Enzymes No results for input(s): CKTOTAL, CKMB, CKMBINDEX, TROPONINI in the last 72 hours.  BNP: BNP (last 3 results) Recent Labs    11/25/19 0500 09/22/20 0348 11/08/20 1344  BNP 596.1* 939.0* 910.0*    ProBNP (last 3 results) No results for input(s): PROBNP in the last 8760 hours.   D-Dimer No results for input(s): DDIMER in the last 72 hours. Hemoglobin  A1C Recent Labs    11/10/20 2248  HGBA1C 10.4*   Fasting Lipid Panel Recent Labs    11/10/20 2224  CHOL 102  HDL 40*  LDLCALC 53  TRIG 43  CHOLHDL 2.6   Thyroid Function Tests Recent Labs    11/10/20 2224  TSH 1.053    Other results:   Imaging    DG C-Arm 1-60 Min-No Report  Result Date: 11/10/2020 Fluoroscopy was utilized by the requesting physician.  No radiographic interpretation.     Medications:     Scheduled Medications: . aspirin EC  81 mg Oral Daily  . Chlorhexidine Gluconate Cloth  6 each Topical Daily  . digoxin  0.125 mg Oral Daily  . feeding supplement (GLUCERNA SHAKE)   237 mL Oral BID BM  . furosemide  80 mg Intravenous BID  . gabapentin  600 mg Oral TID  . insulin aspart  0-15 Units Subcutaneous TID WC  . insulin aspart  0-5 Units Subcutaneous QHS  . insulin aspart  4 Units Subcutaneous TID WC  . insulin glargine  20 Units Subcutaneous QHS  . magnesium oxide  400 mg Oral Daily  . mouth rinse  15 mL Mouth Rinse BID  . mometasone-formoterol  2 puff Inhalation BID  . multivitamin with minerals  1 tablet Oral Daily  . pantoprazole  40 mg Oral Daily  . rosuvastatin  40 mg Oral q1800  . sodium chloride flush  10-40 mL Intracatheter Q12H  . sodium chloride flush  3 mL Intravenous Q12H  . sodium chloride flush  3 mL Intravenous Q12H  . spironolactone  12.5 mg Oral Daily  . traZODone  50 mg Oral QHS    Infusions: . sodium chloride 20 mL/hr at 11/11/20 0900  . amiodarone 30 mg/hr (11/11/20 0900)  . heparin 650 Units/hr (11/11/20 0900)  . impella catheter heparin 50 unit/mL in dextrose 5%    . milrinone 0.375 mcg/kg/min (11/11/20 0900)  . nitroGLYCERIN 35 mcg/min (11/11/20 0900)  . norepinephrine (LEVOPHED) Adult infusion    . 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 type 2 diabetes mellitus, COPD, and hypertension.   Assessment/Plan   1. Acute on chronic systolic HF with biventricular HF -> cardiogenic shock - due to iCM. Cath 12/2 with widely patent LIMA-LAD, SVG-D1, and sequential SVG-OM3-rPDAsevere coronary disease, CI 1.5, CO-OX 49%.  - echo 10/21 EF 20-25% mild RV HK - Echo 11/10/20 EF 15% Moderate to severe RV HK. Moderate to severe MR -  Impella 5.5 placed 12/3. Now on P-7 + milrinone 0.375. Hemodynamics improved. Co-ox 62%. Impella waveforms look good.  LDH ok. On heparin.  - Volume status improving. Will continue IV lasix one more day  - Continue dig and spiro - W/u for advanced therapies underway. LVAD likely best option.  Could consider transplant however timeline, uncontrolled DM2 and social issues likely barriers. Blood type B positive. Body mass index is 27.11 kg/m. He prefers VAD at this point  2. CAD s/p CABG 12/20 - c/b shock. Required Impella support - grafts patent on cath 11/09/20 - No s/s angina   3. PAF - currently in NSR. Remains on amio for  frequent atrial ectopy/PACs - Continue heparin  4. DM2 - cover with SSI - HgBa1c 10.4 - will ask DM nurse to see  5. Hyponatremia - Na 131.  restrict free water  6. H/o cocaine use - clean x 3 years  CRITICAL CARE Performed by: Arvilla Meres  Total critical care time: 55 minutes  Critical care time was exclusive of separately billable procedures and treating other patients.  Critical care was necessary to treat or prevent imminent or life-threatening deterioration.  Critical care was time spent personally by me (independent of midlevel providers or residents) on the following activities: development of treatment plan with patient and/or surrogate as well as nursing, discussions with consultants, evaluation of patient's response to treatment, examination of patient, obtaining history from patient or surrogate, ordering and performing treatments and interventions, ordering and review of laboratory studies, ordering and review of radiographic studies, pulse oximetry and re-evaluation of patient's condition.    Repeat CO-OX at 1200 on higher dose of milrinone. May need mechanical support.  Follow closely.   Length of Stay: 3  Arvilla Meres, MD  11/11/2020, 2:31 PM  Advanced Heart Failure Team Pager [phone] (M-F; 7a - 4p)  Please contact [facility] for night-coverage after hours (4p -7a ) and weekends on [email or web address]

## 2020-11-11 NOTE — Consult Note (Signed)
Consultation Note Date: 11/11/2020   Patient Name: Kevin Underwood  DOB: December 20, 1960  MRN: [id number]  Age / Sex: 59 y.o., male  PCP: Monico Blitz, MD Referring Physician: Elouise Munroe, MD  Reason for Consultation: Establishing goals of care  HPI/Patient Profile: 59 y.o. male  with past medical history of systolic CHF, multivessel CAD s/p CABG in December 2020 (required Impella due to cardiogenic shock), atrial fibrillation, type 2 DM, COPD, HTN, hx of cocaine abuse for 20+ years s/p rehab and clean for approximately 4 years admitted on 11/08/2020 with chest pain and shortness of breath. Cardiac cath 12/2 severe coronary artery disease. 12/3 ECHO with EF 15%. S/p Impella placement 12/3. Cardiology and cardiothoracic surgery following. Pending workup for LVAD candidacy. Palliative medicine consultation for LVAD evaluation/GOC.   Clinical Assessment and Goals of Care:  I have reviewed medical records in detail, discussed with care team, and met with patient at bedside. Naim is awake, alert, oriented and able to participate in discussion. He is in good spirits this afternoon and shares that he feels "better today than in months" following Impella placement yesterday. He denies pain or shortness of breath. Diet has been advanced and he is eager to eat lunch this afternoon.   I introduced Palliative Medicine as specialized medical care for people living with serious illness. It focuses on providing relief from the symptoms and stress of a serious illness. The goal is to improve quality of life for both the patient and the family.  We discussed a brief life review of the patient. Married to wife [name]) for 40+ years. They have four children. 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 After his open heart surgery in 2020, he was able to return back to work within 3 months.   Therapeutic listening as Athanasius  shares his journey with drug abuse (cocaine) for 20+ years and admitted himself into drug rehab in [place]. He reports he has been clean for approximately 4 years. He acknowledges his heart is in the shape it is in from his decisions in the past, but feels he is still here on this earth for a reason. (He almost didn't survive critical illness last year when he required CABG).   Discussed events leading up to admission and course of hospitalization including diagnoses, interventions, plan of care. Bradyn is appreciative of care team taking excellent care of him. Nicki reports understanding of his condition and plan for LVAD placement. He and his wife spoke in depth with Ebony Hail, [place] Coordinator yesterday. Simone denies current questions about LVAD workup and recovery following placement.   Nicco is very positive, motivated, and hopeful that LVAD will allow him more time and better quality of life. He has good family support from his wife, sister, mother, and a son who currently lives with him. Vestal is hopeful to eventually get back to work following LVAD recovery. Ines does understand LVAD is a form of life support (like dialysis) and he will be faced with big decisions at some point  in the future. Kale does acknowledge that quality of life is important to him.   Introduced and discussed AD packet and encouraged living will and HCPOA documentation prior to LVAD placement. Kedric plans to discuss with his wife and consider completing next week. Explained that I will have chaplain follow-up. He is also a believer in [place] and would accept visits from chaplain for support and prayer.   Therapeutic listening as Jr shares many stories about his life. Emotional/spiritual support provided. PMT contact information. Reassured of ongoing palliative support through the course of hospitalization.    SUMMARY OF RECOMMENDATIONS    Great initial palliative discussion with patient. He is positive, motivated, and  hopeful that he will be a candidate for LVAD and this will allow him more time and better quality of life.   Family support: wife, mother, sister, and son.   Introduced and encouraged AD packet completion. Left at bedside. He plans to review with his wife.   Spiritual care consult for intermittent prayer and support. Also, AD packet f/u.   Ongoing palliative support this admission.   Code Status/Advance Care Planning:  Full code  Symptom Management:   Per attending  Palliative Prophylaxis:   Aspiration, Bowel Regimen, Delirium Protocol and Frequent Pain Assessment  Additional Recommendations (Limitations, Scope, Preferences):  Full Scope Treatment  Psycho-social/Spiritual:   Desire for further Chaplaincy support: yes  Additional Recommendations: Caregiving  Support/Resources  Prognosis:   Unable to determine  Discharge Planning: To Be Determined      Primary Diagnoses: Present on Admission: . Unstable angina (HCC)   I have reviewed the medical record, interviewed the patient and family, and examined the patient. The following aspects are pertinent.  Past Medical History:  Diagnosis Date  . Arthritis   . CAD (coronary artery disease)    a. s/p CABG in 11/2019 with LIMA-LAD, SVG-OM1, SVG-PDA and SVG-D1  . CHF (congestive heart failure) ([place])    a. EF < 20% by echo in 11/2019  . COPD (chronic obstructive pulmonary disease) ([place])   . Essential hypertension   . PAF (paroxysmal atrial fibrillation) ([place])   . Type 2 diabetes mellitus ([place])    Social History   Socioeconomic History  . Marital status: Married    Spouse name: Not on file  . Number of children: Not on file  . Years of education: 67  . Highest education level: Not on file  Occupational History  . Not on file  Tobacco Use  . Smoking status: Former Smoker    Packs/day: 1.00    Years: 30.00    Pack years: 30.00    Types: Cigarettes    Quit date: 12/20/2019    Years since quitting: 0.8  .  Smokeless tobacco: Never Used  Vaping Use  . Vaping Use: Never used  Substance and Sexual Activity  . Alcohol use: Yes    Comment: Prior history of excessive intake  . Drug use: No  . Sexual activity: Not on file  Other Topics Concern  . Not on file  Social History Narrative  . Not on file   Social Determinants of Health   Financial Resource Strain: Low Risk   . Difficulty of Paying Living Expenses: Not very hard  Food Insecurity: No Food Insecurity  . Worried About Charity fundraiser in the Last Year: Never true  . Ran Out of Food in the Last Year: Never true  Transportation Needs: No Transportation Needs  . Lack of Transportation (Medical): No  .  Lack of Transportation (Non-Medical): No  Physical Activity: Inactive  . Days of Exercise per Week: 0 days  . Minutes of Exercise per Session: 0 min  Stress:   . Feeling of Stress : Not on file  Social Connections:   . Frequency of Communication with Friends and Family: Not on file  . Frequency of Social Gatherings with Friends and Family: Not on file  . Attends Religious Services: Not on file  . Active Member of Clubs or Organizations: Not on file  . Attends Archivist Meetings: Not on file  . Marital Status: Not on file   Family History  Problem Relation Age of Onset  . Arthritis Other   . Lung disease Other   . Asthma Other   . Diabetes Other   . Heart disease Sister        Tumor?   Scheduled Meds: . aspirin EC  81 mg Oral Daily  . Chlorhexidine Gluconate Cloth  6 each Topical Daily  . digoxin  0.125 mg Oral Daily  . feeding supplement  237 mL Oral BID BM  . furosemide  80 mg Intravenous BID  . gabapentin  600 mg Oral TID  . insulin aspart  0-15 Units Subcutaneous TID WC  . insulin aspart  0-5 Units Subcutaneous QHS  . insulin aspart  4 Units Subcutaneous TID WC  . insulin glargine  20 Units Subcutaneous QHS  . magnesium oxide  400 mg Oral Daily  . mouth rinse  15 mL Mouth Rinse BID  .  mometasone-formoterol  2 puff Inhalation BID  . multivitamin with minerals  1 tablet Oral Daily  . pantoprazole  40 mg Oral Daily  . rosuvastatin  40 mg Oral q1800  . sodium chloride flush  10-40 mL Intracatheter Q12H  . sodium chloride flush  3 mL Intravenous Q12H  . sodium chloride flush  3 mL Intravenous Q12H  . spironolactone  12.5 mg Oral Daily  . traZODone  50 mg Oral QHS   Continuous Infusions: . sodium chloride 20 mL/hr at 11/11/20 0900  . amiodarone 30 mg/hr (11/11/20 0900)  . heparin 650 Units/hr (11/11/20 0900)  . impella catheter heparin 50 unit/mL in dextrose 5%    . milrinone 0.375 mcg/kg/min (11/11/20 0900)  . nitroGLYCERIN 35 mcg/min (11/11/20 0900)  . norepinephrine (LEVOPHED) Adult infusion    . vasopressin     PRN Meds:.sodium chloride, acetaminophen, albuterol, ondansetron (ZOFRAN) IV, oxyCODONE-acetaminophen, sodium chloride flush, sodium chloride flush, traMADol Medications Prior to Admission:  Prior to Admission medications   Medication Sig Start Date End Date Taking? Authorizing Provider  albuterol (PROVENTIL) (2.5 MG/3ML) 0.083% nebulizer solution Take 2.5 mg by nebulization every 6 (six) hours as needed for wheezing or shortness of breath.  08/25/20  Yes [provider]  aspirin EC 81 MG tablet Take 81 mg by mouth daily. Swallow whole.   Yes [provider]  carvedilol (COREG) 6.25 MG tablet TAKE 1 TABLET(6.25 MG) BY MOUTH TWICE DAILY WITH A MEAL Patient taking differently: Take 6.25 mg by mouth 2 (two) times daily with a meal.  09/29/20  Yes Verta Ellen., NP  clopidogrel (PLAVIX) 75 MG tablet TAKE 1 TABLET(75 MG) BY MOUTH DAILY Patient taking differently: Take 75 mg by mouth once.  09/29/20  Yes Verta Ellen., NP  COMBIVENT RESPIMAT 20-100 MCG/ACT AERS respimat Inhale 1 puff into the lungs every 6 (six) hours as needed. 12/08/19  Yes Gold, Patrick Jupiter E, PA-C  dapagliflozin propanediol (FARXIGA) 5  MG TABS tablet Take 1 tablet (5 mg  total) by mouth daily before breakfast. 09/25/20  Yes Barton Dubois, MD  digoxin (LANOXIN) 0.125 MG tablet TAKE 1 TABLET(0.125 MG) BY MOUTH DAILY Patient taking differently: Take 0.125 mg by mouth daily.  09/29/20  Yes Verta Ellen., NP  ENTRESTO 24-26 MG TAKE 1 TABLET BY MOUTH TWICE DAILY 09/29/20  Yes Verta Ellen., NP  furosemide (LASIX) 80 MG tablet Take 0.5-1 tablets (40-80 mg total) by mouth See admin instructions. 44m in the am 410min the evening 09/25/20 09/25/21 Yes MaBarton DuboisMD  gabapentin (NEURONTIN) 600 MG tablet Take 1 tablet (600 mg total) by mouth 3 (three) times daily. 09/25/20  Yes MaBarton DuboisMD  magnesium oxide (MAG-OX) 400 (241.3 Mg) MG tablet Take 1 tablet (400 mg total) by mouth daily. 09/25/20  Yes MaBarton DuboisMD  metFORMIN (GLUCOPHAGE) 500 MG tablet Take 2 tablets (1,000 mg total) by mouth 2 (two) times daily with a meal. 09/25/20  Yes MaBarton DuboisMD  mometasone-formoterol ([facility]-5 MCG/ACT AERO Inhale 2 puffs into the lungs in the morning and at bedtime. 09/25/20  Yes MaBarton DuboisMD  Multiple Vitamin (MULTIVITAMIN WITH MINERALS) TABS tablet Take 1 tablet by mouth daily. 12/09/19  Yes Gold, Wayne E, PA-C  OLANZapine (ZYPREXA) 5 MG tablet Take 5 mg by mouth at bedtime. 10/25/20  Yes [provider]  pantoprazole (PROTONIX) 40 MG tablet Take 1 tablet (40 mg total) by mouth daily. 09/26/20  Yes MaBarton DuboisMD  potassium chloride SA (KLOR-CON) 20 MEQ tablet Take 2 tablets (40 mEq total) by mouth 2 (two) times daily. 09/25/20  Yes MaBarton DuboisMD  rosuvastatin (CRESTOR) 40 MG tablet Take 1 tablet (40 mg total) by mouth daily. 09/25/20  Yes MaBarton DuboisMD  spironolactone (ALDACTONE) 25 MG tablet Take 0.5 tablets (12.5 mg total) by mouth daily. 10/12/20  Yes QuVerta Ellen NP  Tetrahydrozoline HCl (REDNESS RELIEVER EYE DROPS OP) Apply 2 drops to eye daily as needed.   Yes [provider]  traZODone (DESYREL)  150 MG tablet Take 150 mg by mouth at bedtime. 09/27/20  Yes [provider]  blood glucose meter kit and supplies KIT Dispense based on patient and insurance preference. Use to check CBG's three times a day. (FOR ICD-9 250.00, 250.01). 09/25/20   MaBarton DuboisMD   No Known Allergies Review of Systems  All other systems reviewed and are negative.  Physical Exam Vitals and nursing note reviewed.  Constitutional:      General: He is awake.  HENT:     Head: Normocephalic and atraumatic.  Cardiovascular:     Rate and Rhythm: Normal rate.     Comments: Impella Pulmonary:     Effort: No tachypnea, accessory muscle usage or respiratory distress.  Skin:    General: Skin is warm and dry.  Neurological:     Mental Status: He is alert and oriented to person, place, and time.  Psychiatric:        Mood and Affect: Mood normal.        Speech: Speech normal.        Behavior: Behavior normal.        Cognition and Memory: Cognition normal.    Vital Signs: BP 100/75   Pulse 73   Temp (!) 96.8 F (36 C) (Core)   Resp 16   Ht 5' 10" (1.778 m)   Wt 85.7 kg   SpO2 96%  BMI 27.11 kg/m  Pain Scale: 0-10   Pain Score: 0-No pain   SpO2: SpO2: 96 % O2 Device:SpO2: 96 % O2 Flow Rate: .O2 Flow Rate (L/min): 2 L/min  IO: Intake/output summary:   Intake/Output Summary (Last 24 hours) at 11/11/2020 1212 Last data filed at 11/11/2020 0900 Gross per 24 hour  Intake 2515.72 ml  Output 1360 ml  Net 1155.72 ml    LBM: Last BM Date: 11/08/20 Baseline Weight: Weight: 77.1 kg Most recent weight: Weight: 85.7 kg     Palliative Assessment/Data: PPS 50%   Flowsheet Rows     Most Recent Value  Intake Tab  Referral Department Cardiology  Unit at Time of Referral ICU  Palliative Care Primary Diagnosis Cardiac  Palliative Care Type New Palliative care  Reason for referral Clarify Goals of Care  Date first seen by Palliative Care 11/11/20  Clinical Assessment  Palliative  Performance Scale Score 50%  Psychosocial & Spiritual Assessment  Palliative Care Outcomes  Patient/Family meeting held? Yes  Who was at the meeting? patient  Palliative Care Outcomes Clarified goals of care, Provided psychosocial or spiritual support, Linked to palliative care logitudinal support, ACP counseling assistance      Time In: 1100 Time Out: 1215 Time Total: 75 Greater than 50%  of this time was spent counseling and coordinating care related to the above assessment and plan.  Signed by:   Ihor Dow, DNP, FNP-C Palliative Medicine Team  Phone: [phone] Fax: [phone]   Please contact Palliative Medicine Team phone at [phone] for questions and concerns.  For individual provider: See Shea Evans

## 2020-11-11 NOTE — Progress Notes (Signed)
ANTICOAGULATION CONSULT NOTE   Pharmacy Consult for heparin Indication: atrial fibrillation, impella  No Known Allergies  Patient Measurements: Height: 5\' 10"  (177.8 cm) Weight: 85.7 kg (188 lb 15 oz) IBW/kg (Calculated) : 73 Heparin Dosing Weight: 82kg  Vital Signs: Temp: 96.8 F (36 C) (12/04 0800) Temp Source: Core (12/04 0800) Pulse Rate: 73 (12/04 0800)  Labs: Recent Labs    11/08/20 1344 11/08/20 1524 11/09/20 0130 11/09/20 0321 11/09/20 1008 11/09/20 1248 11/09/20 1721 11/10/20 0617 11/10/20 0730 11/10/20 0730 11/10/20 2006 11/10/20 2224 11/11/20 0503  HGB 14.3  --    < >  --   --   --    < >  --  12.5*   < >  --  12.3* 11.3*  HCT 42.0  --    < >  --   --   --    < >  --  36.8*  --   --  36.6* 34.1*  PLT 219  --    < >  --   --   --   --   --  196  --   --  177 156  APTT  --   --   --   --   --   --   --   --   --   --   --  42*  --   LABPROT  --   --   --   --   --   --   --   --   --   --   --  13.8  --   INR  --   --   --   --   --   --   --   --   --   --   --  1.1  --   HEPARINUNFRC  --   --   --  <0.10*  --  0.10*  --   --   --   --   --   --  <0.10*  CREATININE 0.83  --   --   --    < >  --    < >   < >  --   --  1.03 1.08 0.90  TROPONINIHS 42* 39*  --   --   --   --   --   --   --   --   --   --   --    < > = values in this interval not displayed.    Estimated Creatinine Clearance: 91.3 mL/min (by C-G formula based on SCr of 0.9 mg/dL).   Medical History: Past Medical History:  Diagnosis Date  . Arthritis   . CAD (coronary artery disease)    a. s/p CABG in 11/2019 with LIMA-LAD, SVG-OM1, SVG-PDA and SVG-D1  . CHF (congestive heart failure) (HCC)    a. EF < 20% by echo in 11/2019  . COPD (chronic obstructive pulmonary disease) (HCC)   . Essential hypertension   . PAF (paroxysmal atrial fibrillation) (HCC)   . Type 2 diabetes mellitus (HCC)      Assessment: 7 yoM admitted with CP and cardiogenic shock. Pt has hx AFib with no AC PTA,  pharmacy to start IV heparin.   Impella 5.5 placed 12/3.  Heparin currently running in purge solution (50 units/ml) at 11.1 ml/hr = 555 units/hr.  Heparin level this morning is undetectable.  No overt bleeding or complications noted.  Per previous discussion yesterday,  pharmacy was asked to begin IV heparin systemically this morning if heparin level was undetectable.  Goal of Therapy:  Heparin level 0.3-0.7 units/ml Monitor platelets by anticoagulation protocol: Yes   Plan:  Begin IV systemic heparin at 650 units/hr (total 1205 units of heparin with purge). Check heparin level in 6 hrs. q12 hr heparin level for now. Daily CBC.  Reece Leader, Colon Flattery, BCCP Clinical Pharmacist  11/11/2020 9:43 AM   [facility] pharmacy phone numbers are listed on [email or web address]

## 2020-11-11 NOTE — Progress Notes (Signed)
ANTICOAGULATION CONSULT NOTE   Pharmacy Consult for heparin Indication: atrial fibrillation, impella  No Known Allergies  Patient Measurements: Height: 5\' 10"  (177.8 cm) Weight: 85.7 kg (188 lb 15 oz) IBW/kg (Calculated) : 73 Heparin Dosing Weight: 82kg  Vital Signs: Temp: 98.2 F (36.8 C) (12/04 1530) Temp Source: Core (12/04 0800) Pulse Rate: 95 (12/04 1530)  Labs: Recent Labs    11/09/20 1248 11/09/20 1721 11/10/20 0617 11/10/20 0730 11/10/20 0730 11/10/20 2006 11/10/20 2224 11/11/20 0503 11/11/20 1449  HGB  --    < >  --  12.5*   < >  --  12.3* 11.3*  --   HCT  --    < >  --  36.8*  --   --  36.6* 34.1*  --   PLT  --   --   --  196  --   --  177 156  --   APTT  --   --   --   --   --   --  42*  --   --   LABPROT  --   --   --   --   --   --  13.8  --   --   INR  --   --   --   --   --   --  1.1  --   --   HEPARINUNFRC 0.10*  --   --   --   --   --   --  <0.10* <0.10*  CREATININE  --    < >   < >  --   --  1.03 1.08 0.90  --    < > = values in this interval not displayed.    Estimated Creatinine Clearance: 91.3 mL/min (by C-G formula based on SCr of 0.9 mg/dL).   Medical History: Past Medical History:  Diagnosis Date   Arthritis    CAD (coronary artery disease)    a. s/p CABG in 11/2019 with LIMA-LAD, SVG-OM1, SVG-PDA and SVG-D1   CHF (congestive heart failure) (HCC)    a. EF < 20% by echo in 11/2019   COPD (chronic obstructive pulmonary disease) (HCC)    Essential hypertension    PAF (paroxysmal atrial fibrillation) (HCC)    Type 2 diabetes mellitus (HCC)      Assessment: 32 yoM admitted with CP and cardiogenic shock. Pt has hx AFib with no AC PTA, pharmacy to start IV heparin.   Impella 5.5 placed 12/3.  Heparin currently running in purge solution (50 units/ml) at 11.9 = 595 units/hr. Systemic heparin started this AM and heparin level this afternoon remains undetectable (HL<0.1) - will increase the systemic and recheck another level later  this evening. No bleeding or issues noted per discussion with RN.   Goal of Therapy:  Heparin level 0.2-0.5 units/ml Monitor platelets by anticoagulation protocol: Yes   Plan:  - Increase systemic Heparin to 800 units/hr - Purge solution running at 595 units/hr - Total heparin (systemic + purge) running at 1395 units/hr - Will continue to monitor for any signs/symptoms of bleeding and will follow up with heparin level in 6 hours  - Once within range - will check q12h HL  14/3, PharmD, BCPS Clinical Pharmacist Clinical phone for 11/11/2020: 14/03/2020 11/11/2020 4:15 PM   **Pharmacist phone directory can now be found on [email or web address] ([email or web address]).  Listed under [facility].

## 2020-11-12 ENCOUNTER — Encounter (HOSPITAL_COMMUNITY): Payer: 59

## 2020-11-12 ENCOUNTER — Other Ambulatory Visit (HOSPITAL_COMMUNITY): Payer: 59

## 2020-11-12 ENCOUNTER — Inpatient Hospital Stay (HOSPITAL_COMMUNITY): Payer: 59

## 2020-11-12 DIAGNOSIS — R570 Cardiogenic shock: Secondary | ICD-10-CM | POA: Diagnosis not present

## 2020-11-12 DIAGNOSIS — Z95811 Presence of heart assist device: Secondary | ICD-10-CM | POA: Diagnosis not present

## 2020-11-12 LAB — POCT I-STAT, CHEM 8
BUN: 12 mg/dL (ref 6–20)
Calcium, Ion: 1.02 mmol/L — ABNORMAL LOW (ref 1.15–1.40)
Chloride: 100 mmol/L (ref 98–111)
Creatinine, Ser: 0.7 mg/dL (ref 0.61–1.24)
Glucose, Bld: 226 mg/dL — ABNORMAL HIGH (ref 70–99)
HCT: 24 % — ABNORMAL LOW (ref 39.0–52.0)
Hemoglobin: 8.2 g/dL — ABNORMAL LOW (ref 13.0–17.0)
Potassium: 3.4 mmol/L — ABNORMAL LOW (ref 3.5–5.1)
Sodium: 135 mmol/L (ref 135–145)
TCO2: 23 mmol/L (ref 22–32)

## 2020-11-12 LAB — GLUCOSE, CAPILLARY
Glucose-Capillary: 123 mg/dL — ABNORMAL HIGH (ref 70–99)
Glucose-Capillary: 133 mg/dL — ABNORMAL HIGH (ref 70–99)
Glucose-Capillary: 238 mg/dL — ABNORMAL HIGH (ref 70–99)
Glucose-Capillary: 289 mg/dL — ABNORMAL HIGH (ref 70–99)
Glucose-Capillary: 199 mg/dL — ABNORMAL HIGH (ref 70–99)

## 2020-11-12 LAB — CBC
HCT: 28 % — ABNORMAL LOW (ref 39.0–52.0)
HCT: 30 % — ABNORMAL LOW (ref 39.0–52.0)
HCT: 29 % — ABNORMAL LOW (ref 39.0–52.0)
Hemoglobin: 9.7 g/dL — ABNORMAL LOW (ref 13.0–17.0)
Hemoglobin: 10 g/dL — ABNORMAL LOW (ref 13.0–17.0)
Hemoglobin: 10 g/dL — ABNORMAL LOW (ref 13.0–17.0)
MCH: 29 pg (ref 26.0–34.0)
MCH: 28.5 pg (ref 26.0–34.0)
MCH: 29.2 pg (ref 26.0–34.0)
MCHC: 34.6 g/dL (ref 30.0–36.0)
MCHC: 33.3 g/dL (ref 30.0–36.0)
MCHC: 34.5 g/dL (ref 30.0–36.0)
MCV: 83.8 fL (ref 80.0–100.0)
MCV: 85.5 fL (ref 80.0–100.0)
MCV: 84.5 fL (ref 80.0–100.0)
Platelets: 134 10*3/uL — ABNORMAL LOW (ref 150–400)
Platelets: 121 10*3/uL — ABNORMAL LOW (ref 150–400)
Platelets: 120 10*3/uL — ABNORMAL LOW (ref 150–400)
RBC: 3.34 MIL/uL — ABNORMAL LOW (ref 4.22–5.81)
RBC: 3.51 MIL/uL — ABNORMAL LOW (ref 4.22–5.81)
RBC: 3.43 MIL/uL — ABNORMAL LOW (ref 4.22–5.81)
RDW: 14.9 % (ref 11.5–15.5)
RDW: 14.9 % (ref 11.5–15.5)
RDW: 15 % (ref 11.5–15.5)
WBC: 9.6 10*3/uL (ref 4.0–10.5)
WBC: 8.9 10*3/uL (ref 4.0–10.5)
WBC: 8.5 10*3/uL (ref 4.0–10.5)
nRBC: 0 % (ref 0.0–0.2)
nRBC: 0 % (ref 0.0–0.2)
nRBC: 0 % (ref 0.0–0.2)

## 2020-11-12 LAB — LACTATE DEHYDROGENASE: LDH: 190 U/L (ref 98–192)

## 2020-11-12 LAB — BASIC METABOLIC PANEL
Anion gap: 9 (ref 5–15)
BUN: 15 mg/dL (ref 6–20)
CO2: 28 mmol/L (ref 22–32)
Calcium: 8.2 mg/dL — ABNORMAL LOW (ref 8.9–10.3)
Chloride: 93 mmol/L — ABNORMAL LOW (ref 98–111)
Creatinine, Ser: 0.76 mg/dL (ref 0.61–1.24)
GFR, Estimated: >60 mL/min (ref >60)
Glucose, Bld: 126 mg/dL — ABNORMAL HIGH (ref 70–99)
Potassium: 3.5 mmol/L (ref 3.5–5.1)
Sodium: 130 mmol/L — ABNORMAL LOW (ref 135–145)

## 2020-11-12 LAB — COOXEMETRY PANEL
Carboxyhemoglobin: 1.4 % (ref 0.5–1.5)
Methemoglobin: 1 % (ref 0.0–1.5)
O2 Saturation: 61.2 %
Total hemoglobin: 9.5 g/dL — ABNORMAL LOW (ref 12.0–16.0)

## 2020-11-12 LAB — HEPARIN LEVEL (UNFRACTIONATED)
Heparin Unfractionated: 0.16 IU/mL — ABNORMAL LOW (ref 0.30–0.70)
Heparin Unfractionated: 0.31 IU/mL (ref 0.30–0.70)

## 2020-11-12 LAB — POCT ACTIVATED CLOTTING TIME: Activated Clotting Time: 148 seconds

## 2020-11-12 MED ORDER — IOHEXOL 350 MG/ML SOLN
100.0000 mL | Freq: Once | INTRAVENOUS | Status: AC | PRN
  Administered 2020-11-12: 100 mL via INTRAVENOUS

## 2020-11-12 MED ORDER — METOLAZONE 5 MG PO TABS
5.0000 mg | ORAL_TABLET | Freq: Once | ORAL | Status: AC
  Administered 2020-11-12: 5 mg via ORAL
  Filled 2020-11-12: qty 1

## 2020-11-12 MED ORDER — POTASSIUM CHLORIDE CRYS ER 20 MEQ PO TBCR
40.0000 meq | EXTENDED_RELEASE_TABLET | ORAL | Status: AC
  Administered 2020-11-12 (×2): 40 meq via ORAL
  Filled 2020-11-12 (×2): qty 2

## 2020-11-12 MED ORDER — POTASSIUM CHLORIDE CRYS ER 20 MEQ PO TBCR: 20.0000 meq | EXTENDED_RELEASE_TABLET | Freq: Once | ORAL | Status: DC

## 2020-11-12 MED ORDER — FENTANYL CITRATE (PF) 100 MCG/2ML IJ SOLN
25.0000 ug | Freq: Once | INTRAMUSCULAR | Status: AC
  Administered 2020-11-12: 25 ug via INTRAVENOUS

## 2020-11-12 MED ORDER — FENTANYL CITRATE (PF) 100 MCG/2ML IJ SOLN
INTRAMUSCULAR | Status: AC
  Filled 2020-11-12: qty 2

## 2020-11-12 MED ORDER — FENTANYL CITRATE (PF) 100 MCG/2ML IJ SOLN
INTRAMUSCULAR | Status: AC
  Administered 2020-11-12: 50 ug
  Filled 2020-11-12: qty 2

## 2020-11-12 MED ORDER — POTASSIUM CHLORIDE CRYS ER 20 MEQ PO TBCR
40.0000 meq | EXTENDED_RELEASE_TABLET | Freq: Once | ORAL | Status: AC
  Administered 2020-11-12: 40 meq via ORAL
  Filled 2020-11-12: qty 2

## 2020-11-12 MED ORDER — FUROSEMIDE 10 MG/ML IJ SOLN
80.0000 mg | Freq: Three times a day (TID) | INTRAMUSCULAR | Status: DC
  Administered 2020-11-12 (×2): 80 mg via INTRAVENOUS
  Filled 2020-11-12 (×2): qty 8

## 2020-11-12 MED ORDER — HEPARIN (PORCINE) 25000 UT/250ML-% IV SOLN
750.0000 [IU]/h | INTRAVENOUS | Status: AC
  Administered 2020-11-14 – 2020-11-15 (×2): 750 [IU]/h via INTRAVENOUS
  Filled 2020-11-12 (×3): qty 250

## 2020-11-12 NOTE — Progress Notes (Signed)
ANTICOAGULATION CONSULT NOTE   Pharmacy Consult for Heparin Indication: atrial fibrillation, impella  No Known Allergies  Patient Measurements: Height: 5\' 10"  (177.8 cm) Weight: 85.7 kg (188 lb 15 oz) IBW/kg (Calculated) : 73 Heparin Dosing Weight: 82kg  Vital Signs: Temp: 97.7 F (36.5 C) (12/05 0000) BP: 108/88 (12/05 0000) Pulse Rate: 79 (12/05 0000)  Labs: Recent Labs    11/09/20 1248 11/10/20 0617 11/10/20 0730 11/10/20 0730 11/10/20 2006 11/10/20 2224 11/11/20 0503 11/11/20 1449 11/11/20 2303  HGB  --   --  12.5*   < >  --  12.3* 11.3*  --   --   HCT  --   --  36.8*  --   --  36.6* 34.1*  --   --   PLT  --   --  196  --   --  177 156  --   --   APTT  --   --   --   --   --  42*  --   --   --   LABPROT  --   --   --   --   --  13.8  --   --   --   INR  --   --   --   --   --  1.1  --   --   --   HEPARINUNFRC   < >  --   --   --   --   --  <0.10* <0.10* 0.16*  CREATININE  --    < >  --   --  1.03 1.08 0.90  --   --    < > = values in this interval not displayed.    Estimated Creatinine Clearance: 91.3 mL/min (by C-G formula based on SCr of 0.9 mg/dL).   Medical History: Past Medical History:  Diagnosis Date  . Arthritis   . CAD (coronary artery disease)    a. s/p CABG in 11/2019 with LIMA-LAD, SVG-OM1, SVG-PDA and SVG-D1  . CHF (congestive heart failure) (HCC)    a. EF < 20% by echo in 11/2019  . COPD (chronic obstructive pulmonary disease) (HCC)   . Essential hypertension   . PAF (paroxysmal atrial fibrillation) (HCC)   . Type 2 diabetes mellitus (HCC)      Assessment: 67 yoM admitted with CP and cardiogenic shock. Pt has hx AFib with no AC PTA, pharmacy to start IV heparin.   Impella 5.5 placed 12/3.  12/5 AM update: Heparin level just below goal but trending up  Goal of Therapy:  Heparin level 0.2-0.5 units/ml Monitor platelets by anticoagulation protocol: Yes   Plan:  - Increase systemic Heparin to 900 units/hr - Purge solution running  at 600 units/hr - Total heparin (systemic + purge) running at 1500 units/hr - Will continue to monitor for any signs/symptoms of bleeding and will follow up with heparin level in 6 hours  - Once within range - will check q12h HL  14/5, PharmD, BCPS Clinical Pharmacist Phone: [phone]

## 2020-11-12 NOTE — Progress Notes (Signed)
Advanced Heart Failure Rounding Note  PCP-Cardiologist: Nona Dell, MD   Subjective:    12/2 Had cath as noted below with intact revascularization. CO-OX 39%, started on milrinone. Started IV lasix. Negative 1.3 liters.  12/3 Impella 5.5 placed   Remains on Impella 5.5 at P-7, milrinone 0.375. Off NE/VP. Co-ox 61%   Developed bleeding at Impella site soaking bandages. I held manual pressure for 20 mins and placed pressure dressing. Site sore.   On lasix 80 IV bid. Good urine output but CVP remains 19-20. Denies SOB, orthopnea or PND.   Swan CVP 19 PA 52/24 Thermo 8.2/4.1 PAPi 1.5   Impella 5.5 P-7 Flow 3.5 L Waveforms ok LDH 190   RHC/LHC 11/09/20  RA 20 PCWP 30-35 PA Sat 40% CO 3 CI 1.5.  1. Severe native coronary artery disease, similar to prior catheterization in 11/2019.  2. Widely patent LIMA-LAD, SVG-D1, and sequential SVG-OM3-rPDA. 3. Severely elevated left and right heart filling pressures. 4. Severely reduced cardiac output/index. 5. Successful placement of leave-in PA catheter via the right internal jugular vein.   Objective:   Weight Range: 85.9 kg Body mass index is 27.17 kg/m.   Vital Signs:   Temp:  [97.3 F (36.3 C)-98.4 F (36.9 C)] 98.1 F (36.7 C) (12/05 1400) Pulse Rate:  [78-110] 83 (12/05 1400) Resp:  [11-23] 14 (12/05 1400) BP: (85-126)/(69-107) 109/82 (12/05 0700) SpO2:  [90 %-100 %] 99 % (12/05 1400) Arterial Line BP: (88-128)/(59-93) 106/71 (12/05 1400) Weight:  [85.9 kg] 85.9 kg (12/05 0500) Last BM Date: 11/08/20  Weight change: Filed Weights   11/10/20 0500 11/11/20 0300 11/12/20 0500  Weight: 82.4 kg 85.7 kg 85.9 kg    Intake/Output:   Intake/Output Summary (Last 24 hours) at 11/12/2020 1424 Last data filed at 11/12/2020 1400 Gross per 24 hour  Intake 3211.63 ml  Output 4385 ml  Net -1173.37 ml      Physical Exam   General:  Lying in bed No resp difficulty HEENT: normal Neck: supple. RIJ swan.  Carotids 2+ bilat; no bruits. No lymphadenopathy or thryomegaly appreciated. Cor: PMI nondisplaced. Regular rate & rhythm. Impella hum. Moderate-sized hematoma at impella site Lungs: clear Abdomen: obese soft, nontender, nondistended. No hepatosplenomegaly. No bruits or masses. Good bowel sounds. Extremities: no cyanosis, clubbing, rash, 1-2+ edema Neuro: alert & orientedx3, cranial nerves grossly intact. moves all 4 extremities w/o difficulty. Affect pleasant   Telemetry   SR 80-90s Personally reviewed   Labs    CBC Recent Labs    11/10/20 2224 11/11/20 0503 11/12/20 0515 11/12/20 1252  WBC 13.3*   < > 9.6 8.9  NEUTROABS 12.2*  --   --   --   HGB 12.3*   < > 9.7* 10.0*  HCT 36.6*   < > 28.0* 30.0*  MCV 84.3   < > 83.8 85.5  PLT 177   < > 134* 121*   < > = values in this interval not displayed.   Basic Metabolic Panel Recent Labs    62/94/76 2224 11/10/20 2224 11/11/20 0503 11/12/20 0515  NA 132*   < > 131* 130*  K 3.9   < > 4.0 3.5  CL 97*   < > 95* 93*  CO2 22   < > 23 28  GLUCOSE 211*   < > 232* 126*  BUN 23*   < > 21* 15  CREATININE 1.08   < > 0.90 0.76  CALCIUM 8.4*   < > 8.4* 8.2*  MG 2.0  --  2.0  --    < > = values in this interval not displayed.   Liver Function Tests Recent Labs    11/10/20 2224  AST 21  ALT 26  ALKPHOS 81  BILITOT 1.1  PROT 5.8*  ALBUMIN 3.0*   No results for input(s): LIPASE, AMYLASE in the last 72 hours. Cardiac Enzymes No results for input(s): CKTOTAL, CKMB, CKMBINDEX, TROPONINI in the last 72 hours.  BNP: BNP (last 3 results) Recent Labs    11/25/19 0500 09/22/20 0348 11/08/20 1344  BNP 596.1* 939.0* 910.0*    ProBNP (last 3 results) No results for input(s): PROBNP in the last 8760 hours.   D-Dimer No results for input(s): DDIMER in the last 72 hours. Hemoglobin A1C Recent Labs    11/10/20 2248  HGBA1C 10.4*   Fasting Lipid Panel Recent Labs    11/10/20 2224  CHOL 102  HDL 40*  LDLCALC 53  TRIG  43  CHOLHDL 2.6   Thyroid Function Tests Recent Labs    11/10/20 2224  TSH 1.053    Other results:   Imaging    No results found.   Medications:     Scheduled Medications: . aspirin EC  81 mg Oral Daily  . Chlorhexidine Gluconate Cloth  6 each Topical Daily  . digoxin  0.125 mg Oral Daily  . feeding supplement (GLUCERNA SHAKE)  237 mL Oral BID BM  . furosemide  80 mg Intravenous TID  . gabapentin  600 mg Oral TID  . insulin aspart  0-15 Units Subcutaneous TID WC  . insulin aspart  0-5 Units Subcutaneous QHS  . insulin aspart  4 Units Subcutaneous TID WC  . insulin glargine  20 Units Subcutaneous QHS  . magnesium oxide  400 mg Oral Daily  . mouth rinse  15 mL Mouth Rinse BID  . metolazone  5 mg Oral Once  . mometasone-formoterol  2 puff Inhalation BID  . multivitamin with minerals  1 tablet Oral Daily  . mupirocin ointment  1 application Nasal BID  . pantoprazole  40 mg Oral Daily  . potassium chloride  40 mEq Oral Q4H  . rosuvastatin  40 mg Oral q1800  . sodium chloride flush  10-40 mL Intracatheter Q12H  . sodium chloride flush  3 mL Intravenous Q12H  . sodium chloride flush  3 mL Intravenous Q12H  . spironolactone  12.5 mg Oral Daily  . traZODone  50 mg Oral QHS    Infusions: . sodium chloride 10 mL/hr at 11/12/20 1400  . amiodarone 30 mg/hr (11/12/20 1400)  . heparin    . impella catheter heparin 50 unit/mL in dextrose 5%    . milrinone 0.375 mcg/kg/min (11/12/20 1400)  . nitroGLYCERIN Stopped (11/12/20 1041)  . norepinephrine (LEVOPHED) Adult infusion    . vasopressin      PRN Medications: sodium chloride, acetaminophen, albuterol, LORazepam, ondansetron (ZOFRAN) IV, oxyCODONE-acetaminophen, sodium chloride flush, sodium chloride flush, traMADol    Patient Profile   Kevin Underwood a 59 y.o.malewith a history of systolic HF, multivessel CAD status post CABG in December 2020 (with Maze and LAA clipping) at which point he required Impella  support due to cardiogenic shock, paroxysmal atrial fibrillation. type 2 diabetes mellitus, COPD, and hypertension.   Assessment/Plan   1. Acute on chronic systolic HF with biventricular HF -> cardiogenic shock - due to iCM. Cath 12/2 with widely patent LIMA-LAD, SVG-D1, and sequential SVG-OM3-rPDAsevere coronary disease, CI 1.5, CO-OX 49%.  -  echo 10/21 EF 20-25% mild RV HK - Echo 11/10/20 EF 15% Moderate to severe RV HK. Moderate to severe MR - Impella 5.5 placed 12/3. Now on P-7 + milrinone 0.375. Hemodynamics improved. Co-ox 61%. Impella waveforms look good.  LDH ok. On heparin -> developed hematoma at Impella site. Hold heparin x 2 hours cut rate 900u -> 750. Continue purge heparin. Discussed dosing with PharmD personally.  - Volume status back up. Increase lasix to 80 tid. Give metolazone 5mg  x 1. Limit po intake.   - Continue dig and spiro - W/u for advanced therapies underway. LVAD likely best option.  Could consider transplant however timeline, uncontrolled DM2 and social issues likely barriers. Blood type B positive. Body mass index is 27.17 kg/m. He prefers VAD at this point  2. CAD s/p CABG 12/20 - c/b shock. Required Impella support - grafts patent on cath 11/09/20 - No s/s angina  3. PAF - currently in NSR. Remains on amio for frequent atrial ectopy/PACs - Continue heparin (plan as above)  4. DM2 - cover with SSI - HgBa1c 10.4 - will ask DM nurse to see  5. Hyponatremia - Na 130  restrict free water  6. H/o cocaine use - clean x 3 years  7. Hematoma at Impella site - I held pressure x 20 mins. Pressure dressing placed - Hold heparin x 2 hours cut rate 900u -> 750. Continue purge heparin. Discussed dosing with PharmD personally.   CRITICAL CARE Performed by: 14/2/21  Total critical care time: 60 minutes  Critical care time was exclusive of separately billable procedures and treating other patients.  Critical care was necessary to treat or prevent  imminent or life-threatening deterioration.  Critical care was time spent personally by me (independent of midlevel providers or residents) on the following activities: development of treatment plan with patient and/or surrogate as well as nursing, discussions with consultants, evaluation of patient's response to treatment, examination of patient, obtaining history from patient or surrogate, ordering and performing treatments and interventions, ordering and review of laboratory studies, ordering and review of radiographic studies, pulse oximetry and re-evaluation of patient's condition.    Repeat CO-OX at 1200 on higher dose of milrinone. May need mechanical support.  Follow closely.   Length of Stay: 4  Arvilla Meres, MD  11/12/2020, 2:24 PM  Advanced Heart Failure Team Pager [phone] (M-F; 7a - 4p)  Please contact [facility] for night-coverage after hours (4p -7a ) and weekends on [email or web address]

## 2020-11-12 NOTE — Progress Notes (Signed)
Dr Gala Romney notified of pt's blood sugars being consistently >220.  Diabetes Consult placed per order.   Pt on diet cho mod diet.  I changed ensure to glucerna 12/4.

## 2020-11-12 NOTE — Progress Notes (Signed)
Pt called to complain that he is having 10/10 pain under left armpit and behind his left shoulder.  Pt turned to side and large firm bruise visualized distal to left armpit to a bit laterally.  Firm area noted just superior to impella insertion site.   H/H obtained via ISTAT.  Hgb 8.2/24.  Stat CBC sent to lab to confirm reading.  Dr Gala Romney notified with immediate response.  Order received for stat chest CT with contrast.    Pt to stat chest ct accompanied by oncoming RN.  No problems noted en route to CT and back to room.    Dr Gala Romney called me back at 2001 with orders to hold heparin x2 hrs.   Heparin off at 2015 and to be resumed at 2215.    No increase in bleeding/firmness/or increase bruising to lateral to posterior back.  Report given to oncoming nurse.

## 2020-11-12 NOTE — Progress Notes (Signed)
Pt c/o increase pain in left anterior upper chest incision.  Incision covered with dressing and appears more edematous.  Dressing taken off and blood noted at a slow ooze between staples.  Site redressed.  Heparin infusing at 900 units/hr.  PharmD notified.  ACT 148.  Stat CBC drawn.  Dr Gala Romney notified.  10 minutes after site redressed, gauze dressing saturated through with bright red blood.  Heparin 900units/hr stopped x 2 hrs per Dr Gala Romney.  Will resume heparin at 750units per hr per MD. Dr. Gala Romney at bedside holding manual pressure x20 minutes.  Hemostasis obtained after 20 minutes.  Pressure dressing applied.  Will continue to closely monitor.

## 2020-11-12 NOTE — Op Note (Signed)
Procedure(s): PLACEMENT OF IMPELLA 5.5 LEFT VENTRICULAR ASSIST DEVICE VIA  LEFT AVILLARY ARTERY TRANSESOPHAGEAL ECHOCARDIOGRAM (TEE) Procedure Note  Kevin Underwood male 59 y.o. 11/10/20 Procedure(s) and Anesthesia Type:    * PLACEMENT OF IMPELLA 5.5 LEFT VENTRICULAR ASSIST DEVICE VIA  LEFT AVILLARY ARTERY - General    * TRANSESOPHAGEAL ECHOCARDIOGRAM (TEE) - General  Surgeon(s) and Role:    * [name], MD - Primary   Indications: The patient was admitted to the hospital with a brief history of heart failure.  He is status post CABG approximately 1 year ago for low EF.  Repeat cath shows patent grafts but depressed ejection fraction.  He is taken to the operating room now for Impella LVAD support     Surgeon: [name]   Assistants: Staff  Anesthesia: General endotracheal anesthesia  ASA Class: 4    Procedure Detail  PLACEMENT OF IMPELLA 5.5 LEFT VENTRICULAR ASSIST DEVICE VIA  LEFT AVILLARY ARTERY, TRANSESOPHAGEAL ECHOCARDIOGRAM (TEE) After informed consent was obtained, the patient was taken to the operating room on the above listed date.  He is placed in the supine position on the operating table.  Anesthesia was induced with a smooth general endotracheal technique.  After confirming adequate anesthesia the anterior chest was cleansed and draped as a sterile vision Betadine solution.  A preop surgical pause was performed.  An incision was made overlying the left deltopectoral groove and dissection was carried down to the subcutaneous tissue with electrocautery.  The pectoralis major and minor muscles were divided.  The left axillary artery was encircled and 5000 units of heparin were given intravenously.  A 10 mm dacryon graft was sewn end-to-side to the axillary artery.  This was tunneled out and inferior stab incision.  The graft was used as a conduit through which the Impella 5.5 LVAD was placed under fluoroscopic and TEE guidance and a modified Seldinger  technique.  Once the position of the catheter was confirmed, it was turned on.  The positioning of the catheter was adjusted.  The dacryon graft was cut to the level of the skin and the repositioning sheath of the Impella device was positioned within the stump of the graft.  The wounds were then closed in layers.  Sterile dressings were applied.  All sponge instruments and needle counts were correct.  Estimated Blood Loss:  less than 50 mL         Drains: None        Blood Given: none          Specimens: None          Complications:  * No complications entered in OR log *         Disposition: ICU - extubated and stable.         Condition: stable

## 2020-11-12 NOTE — Progress Notes (Addendum)
ANTICOAGULATION CONSULT NOTE   Pharmacy Consult for Heparin Indication: atrial fibrillation, impella  No Known Allergies  Patient Measurements: Height: 5\' 10"  (177.8 cm) Weight: 85.9 kg (189 lb 6 oz) IBW/kg (Calculated) : 73 Heparin Dosing Weight: 82kg  Vital Signs: Temp: 97.5 F (36.4 C) (12/05 0700) BP: 109/82 (12/05 0700) Pulse Rate: 82 (12/05 0700)  Labs: Recent Labs    11/09/20 1248 11/10/20 2224 11/11/20 0503 11/11/20 0503 11/11/20 1449 11/11/20 2303 11/12/20 0515 11/12/20 0750  HGB   < > 12.3* 11.3*  --   --   --  9.7*  --   HCT  --  36.6* 34.1*  --   --   --  28.0*  --   PLT  --  177 156  --   --   --  134*  --   APTT  --  42*  --   --   --   --   --   --   LABPROT  --  13.8  --   --   --   --   --   --   INR  --  1.1  --   --   --   --   --   --   HEPARINUNFRC   < >  --  <0.10*   < > <0.10* 0.16*  --  0.31  CREATININE  --  1.08 0.90  --   --   --  0.76  --    < > = values in this interval not displayed.    Estimated Creatinine Clearance: 102.7 mL/min (by C-G formula based on SCr of 0.76 mg/dL).   Medical History: Past Medical History:  Diagnosis Date  . Arthritis   . CAD (coronary artery disease)    a. s/p CABG in 11/2019 with LIMA-LAD, SVG-OM1, SVG-PDA and SVG-D1  . CHF (congestive heart failure) (HCC)    a. EF < 20% by echo in 11/2019  . COPD (chronic obstructive pulmonary disease) (HCC)   . Essential hypertension   . PAF (paroxysmal atrial fibrillation) (HCC)   . Type 2 diabetes mellitus (HCC)      Assessment: 10 yoM admitted with CP and cardiogenic shock. Pt has hx AFib with no AC PTA, pharmacy to start IV heparin.   Impella 5.5 placed 12/3.  Heparin level this morning 0.31 which is within goal range.  No overt bleeding or complications noted.  IV heparin systemically at 900 units/hr + heparin in purge solution at 605 units/hr.  PM f/u > significant bleeding at Impella site - improved with pressure.  Discussed with Dr.  14/3.  Goal of Therapy:  Heparin level 0.2-0.5 units/ml Monitor platelets by anticoagulation protocol: Yes   Plan:  - Hold heparin x 2 hrs; then resume at lower rate of 750 units/hr.   - Check heparin level in 6 hrs, will not titrate for now unless heparin level high. - Purge solution running at 600 units/hr - Will continue to monitor for any signs/symptoms of bleeding and will follow up with heparin level in 6 hours  - Heparin levels and CBC q 12 hrs.  Gala Romney, Reece Leader, BCCP Clinical Pharmacist  11/12/2020 9:01 AM   [facility] pharmacy phone numbers are listed on [email or web address]

## 2020-11-13 ENCOUNTER — Inpatient Hospital Stay (HOSPITAL_COMMUNITY): Payer: 59

## 2020-11-13 ENCOUNTER — Ambulatory Visit: Payer: 59 | Admitting: Family Medicine

## 2020-11-13 ENCOUNTER — Encounter (HOSPITAL_COMMUNITY): Payer: 59

## 2020-11-13 ENCOUNTER — Encounter (HOSPITAL_COMMUNITY): Payer: Self-pay | Admitting: Cardiothoracic Surgery

## 2020-11-13 DIAGNOSIS — I5023 Acute on chronic systolic (congestive) heart failure: Secondary | ICD-10-CM | POA: Diagnosis not present

## 2020-11-13 DIAGNOSIS — R570 Cardiogenic shock: Secondary | ICD-10-CM | POA: Diagnosis not present

## 2020-11-13 DIAGNOSIS — Z01810 Encounter for preprocedural cardiovascular examination: Secondary | ICD-10-CM

## 2020-11-13 LAB — COOXEMETRY PANEL
Carboxyhemoglobin: 1.2 % (ref 0.5–1.5)
Carboxyhemoglobin: 1.4 % (ref 0.5–1.5)
Methemoglobin: 0.8 % (ref 0.0–1.5)
Methemoglobin: 0.8 % (ref 0.0–1.5)
O2 Saturation: 46.1 %
O2 Saturation: 59.4 %
Total hemoglobin: 10.3 g/dL — ABNORMAL LOW (ref 12.0–16.0)
Total hemoglobin: 9 g/dL — ABNORMAL LOW (ref 12.0–16.0)

## 2020-11-13 LAB — POCT I-STAT 7, (LYTES, BLD GAS, ICA,H+H)
Acid-Base Excess: 2 mmol/L (ref 0.0–2.0)
Acid-Base Excess: 11 mmol/L — ABNORMAL HIGH (ref 0.0–2.0)
Bicarbonate: 27.8 mmol/L (ref 20.0–28.0)
Bicarbonate: 33.1 mmol/L — ABNORMAL HIGH (ref 20.0–28.0)
Calcium, Ion: 1.18 mmol/L (ref 1.15–1.40)
Calcium, Ion: 1.1 mmol/L — ABNORMAL LOW (ref 1.15–1.40)
HCT: 38 % — ABNORMAL LOW (ref 39.0–52.0)
HCT: 35 % — ABNORMAL LOW (ref 39.0–52.0)
Hemoglobin: 12.9 g/dL — ABNORMAL LOW (ref 13.0–17.0)
Hemoglobin: 11.9 g/dL — ABNORMAL LOW (ref 13.0–17.0)
O2 Saturation: 99 %
O2 Saturation: 92 %
Patient temperature: 37.7
Potassium: 3.4 mmol/L — ABNORMAL LOW (ref 3.5–5.1)
Potassium: 3.9 mmol/L (ref 3.5–5.1)
Sodium: 136 mmol/L (ref 135–145)
Sodium: 129 mmol/L — ABNORMAL LOW (ref 135–145)
TCO2: 29 mmol/L (ref 22–32)
TCO2: 34 mmol/L — ABNORMAL HIGH (ref 22–32)
pCO2 arterial: 44.9 mmHg (ref 32.0–48.0)
pCO2 arterial: 36.1 mmHg (ref 32.0–48.0)
pH, Arterial: 7.4 (ref 7.350–7.450)
pH, Arterial: 7.573 — ABNORMAL HIGH (ref 7.350–7.450)
pO2, Arterial: 128 mmHg — ABNORMAL HIGH (ref 83.0–108.0)
pO2, Arterial: 56 mmHg — ABNORMAL LOW (ref 83.0–108.0)

## 2020-11-13 LAB — BASIC METABOLIC PANEL
Anion gap: 9 (ref 5–15)
Anion gap: 11 (ref 5–15)
BUN: 11 mg/dL (ref 6–20)
BUN: 12 mg/dL (ref 6–20)
CO2: 28 mmol/L (ref 22–32)
CO2: 28 mmol/L (ref 22–32)
Calcium: 8.6 mg/dL — ABNORMAL LOW (ref 8.9–10.3)
Calcium: 8.9 mg/dL (ref 8.9–10.3)
Chloride: 93 mmol/L — ABNORMAL LOW (ref 98–111)
Chloride: 88 mmol/L — ABNORMAL LOW (ref 98–111)
Creatinine, Ser: 0.85 mg/dL (ref 0.61–1.24)
Creatinine, Ser: 0.96 mg/dL (ref 0.61–1.24)
GFR, Estimated: >60 mL/min (ref >60)
GFR, Estimated: >60 mL/min (ref >60)
Glucose, Bld: 170 mg/dL — ABNORMAL HIGH (ref 70–99)
Glucose, Bld: 212 mg/dL — ABNORMAL HIGH (ref 70–99)
Potassium: 4.2 mmol/L (ref 3.5–5.1)
Potassium: 3.9 mmol/L (ref 3.5–5.1)
Sodium: 130 mmol/L — ABNORMAL LOW (ref 135–145)
Sodium: 127 mmol/L — ABNORMAL LOW (ref 135–145)

## 2020-11-13 LAB — PULMONARY FUNCTION TEST
FEF 25-75 Pre: 0.82 L/sec
FEF2575-%Pred-Pre: 27 %
FEV1-%Pred-Pre: 49 %
FEV1-Pre: 1.79 L
FEV1FVC-%Pred-Pre: 80 %
FEV6-%Pred-Pre: 62 %
FEV6-Pre: 2.87 L
FEV6FVC-%Pred-Pre: 102 %
FVC-%Pred-Pre: 60 %
FVC-Pre: 2.92 L
Pre FEV1/FVC ratio: 61 %
Pre FEV6/FVC Ratio: 98 %

## 2020-11-13 LAB — CBC
HCT: 29.8 % — ABNORMAL LOW (ref 39.0–52.0)
Hemoglobin: 10.4 g/dL — ABNORMAL LOW (ref 13.0–17.0)
MCH: 29.6 pg (ref 26.0–34.0)
MCHC: 34.9 g/dL (ref 30.0–36.0)
MCV: 84.9 fL (ref 80.0–100.0)
Platelets: 126 10*3/uL — ABNORMAL LOW (ref 150–400)
RBC: 3.51 MIL/uL — ABNORMAL LOW (ref 4.22–5.81)
RDW: 15.1 % (ref 11.5–15.5)
WBC: 10.5 10*3/uL (ref 4.0–10.5)
nRBC: 0 % (ref 0.0–0.2)

## 2020-11-13 LAB — POCT I-STAT, CHEM 8
BUN: 26 mg/dL — ABNORMAL HIGH (ref 6–20)
Calcium, Ion: 1.17 mmol/L (ref 1.15–1.40)
Chloride: 96 mmol/L — ABNORMAL LOW (ref 98–111)
Creatinine, Ser: 0.9 mg/dL (ref 0.61–1.24)
Glucose, Bld: 95 mg/dL (ref 70–99)
HCT: 39 % (ref 39.0–52.0)
Hemoglobin: 13.3 g/dL (ref 13.0–17.0)
Potassium: 3.3 mmol/L — ABNORMAL LOW (ref 3.5–5.1)
Sodium: 136 mmol/L (ref 135–145)
TCO2: 25 mmol/L (ref 22–32)

## 2020-11-13 LAB — IRON AND TIBC
Iron: 26 ug/dL — ABNORMAL LOW (ref 45–182)
Saturation Ratios: 7 % — ABNORMAL LOW (ref 17.9–39.5)
TIBC: 361 ug/dL (ref 250–450)
UIBC: 335 ug/dL

## 2020-11-13 LAB — ECHOCARDIOGRAM LIMITED
Height: 70 in
Weight: 2839.52 oz

## 2020-11-13 LAB — RAPID URINE DRUG SCREEN, HOSP PERFORMED
Amphetamines: NOT DETECTED
Barbiturates: NOT DETECTED
Benzodiazepines: NOT DETECTED
Cocaine: NOT DETECTED
Opiates: NOT DETECTED
Tetrahydrocannabinol: NOT DETECTED

## 2020-11-13 LAB — HEPARIN LEVEL (UNFRACTIONATED): Heparin Unfractionated: 0.14 IU/mL — ABNORMAL LOW (ref 0.30–0.70)

## 2020-11-13 LAB — GLUCOSE, CAPILLARY
Glucose-Capillary: 176 mg/dL — ABNORMAL HIGH (ref 70–99)
Glucose-Capillary: 194 mg/dL — ABNORMAL HIGH (ref 70–99)
Glucose-Capillary: 117 mg/dL — ABNORMAL HIGH (ref 70–99)
Glucose-Capillary: 313 mg/dL — ABNORMAL HIGH (ref 70–99)

## 2020-11-13 LAB — HEPATITIS B SURFACE ANTIBODY, QUANTITATIVE: Hep B S AB Quant (Post): <3.1 m[IU]/mL — ABNORMAL LOW (ref >9.9)

## 2020-11-13 LAB — LACTATE DEHYDROGENASE: LDH: 214 U/L — ABNORMAL HIGH (ref 98–192)

## 2020-11-13 MED ORDER — MORPHINE SULFATE (PF) 2 MG/ML IV SOLN
2.0000 mg | Freq: Once | INTRAVENOUS | Status: AC
  Administered 2020-11-13: 2 mg via INTRAVENOUS
  Filled 2020-11-13: qty 1

## 2020-11-13 MED ORDER — POTASSIUM CHLORIDE CRYS ER 20 MEQ PO TBCR
40.0000 meq | EXTENDED_RELEASE_TABLET | Freq: Once | ORAL | Status: AC
  Administered 2020-11-13: 40 meq via ORAL
  Filled 2020-11-13: qty 2

## 2020-11-13 MED ORDER — OXYCODONE-ACETAMINOPHEN 5-325 MG PO TABS
2.0000 | ORAL_TABLET | ORAL | Status: DC | PRN
  Administered 2020-11-13 – 2020-11-16 (×11): 2 via ORAL
  Filled 2020-11-13 (×11): qty 2

## 2020-11-13 MED ORDER — FUROSEMIDE 10 MG/ML IJ SOLN
12.0000 mg/h | INTRAVENOUS | Status: DC
  Administered 2020-11-13 – 2020-11-14 (×2): 12 mg/h via INTRAVENOUS
  Filled 2020-11-13 (×2): qty 20

## 2020-11-13 MED ORDER — OXYCODONE-ACETAMINOPHEN 5-325 MG PO TABS
1.0000 | ORAL_TABLET | Freq: Three times a day (TID) | ORAL | Status: DC | PRN
  Administered 2020-11-16: 1 via ORAL
  Filled 2020-11-13 (×2): qty 1

## 2020-11-13 MED ORDER — SODIUM CHLORIDE 0.9 % IV SOLN
510.0000 mg | Freq: Once | INTRAVENOUS | Status: AC
  Administered 2020-11-13: 510 mg via INTRAVENOUS
  Filled 2020-11-13: qty 17

## 2020-11-13 MED ORDER — METOLAZONE 2.5 MG PO TABS
2.5000 mg | ORAL_TABLET | Freq: Once | ORAL | Status: AC
  Administered 2020-11-13: 2.5 mg via ORAL
  Filled 2020-11-13: qty 1

## 2020-11-13 MED ORDER — FUROSEMIDE 10 MG/ML IJ SOLN
80.0000 mg | Freq: Once | INTRAMUSCULAR | Status: AC
  Administered 2020-11-13: 80 mg via INTRAVENOUS
  Filled 2020-11-13: qty 8

## 2020-11-13 MED ORDER — CHLORHEXIDINE GLUCONATE 0.12 % MT SOLN
15.0000 mL | Freq: Two times a day (BID) | OROMUCOSAL | Status: DC
  Administered 2020-11-13 – 2020-11-16 (×7): 15 mL via OROMUCOSAL
  Filled 2020-11-13 (×7): qty 15

## 2020-11-13 NOTE — Progress Notes (Signed)
VASCULAR LAB    Bilateral lower extremity venous duplex has been performed.  See CV proc for preliminary results.   Ercie Eliasen, RVT 11/13/2020, 1:22 PM

## 2020-11-13 NOTE — Progress Notes (Signed)
  I called [facility]   Dr Chales Salmon [phone] - Consult requested.   Alijah Akram NP-C  1:19 PM

## 2020-11-13 NOTE — Progress Notes (Signed)
  Echocardiogram 2D Echocardiogram has been performed.  Kevin Underwood [name] 11/13/2020, 10:17 AM

## 2020-11-13 NOTE — Progress Notes (Signed)
ANTICOAGULATION CONSULT NOTE   Pharmacy Consult for Heparin Indication: atrial fibrillation, impella  No Known Allergies  Patient Measurements: Height: 5\' 10"  (177.8 cm) Weight: 80.5 kg (177 lb 7.5 oz) IBW/kg (Calculated) : 73 Heparin Dosing Weight: 82kg  Vital Signs: Temp: 99.7 F (37.6 C) (12/06 1500) BP: 114/95 (12/06 1500) Pulse Rate: 87 (12/06 1500)  Labs: Recent Labs    11/10/20 2224 11/11/20 0503 11/11/20 2303 11/12/20 0515 11/12/20 0750 11/12/20 1252 11/12/20 1252 11/12/20 1845 11/12/20 1845 11/12/20 1851 11/12/20 1851 11/13/20 0454 11/13/20 1332 11/13/20 1536  HGB 12.3*   < >  --    < >  --  10.0*   < > 8.2*   < > 10.0*   < > 10.4*  --  11.9*  HCT 36.6*   < >  --    < >  --  30.0*   < > 24.0*   < > 29.0*  --  29.8*  --  35.0*  PLT 177   < >  --    < >  --  121*  --   --   --  120*  --  126*  --   --   APTT 42*  --   --   --   --   --   --   --   --   --   --   --   --   --   LABPROT 13.8  --   --   --   --   --   --   --   --   --   --   --   --   --   INR 1.1  --   --   --   --   --   --   --   --   --   --   --   --   --   HEPARINUNFRC  --    < > 0.16*  --  0.31  --   --   --   --   --   --  0.14*  --   --   CREATININE 1.08   < >  --    < >  --   --   --  0.70  --   --   --  0.85 0.96  --    < > = values in this interval not displayed.    Estimated Creatinine Clearance: 85.5 mL/min (by C-G formula based on SCr of 0.96 mg/dL).   Medical History: Past Medical History:  Diagnosis Date  . Arthritis   . CAD (coronary artery disease)    a. s/p CABG in 11/2019 with LIMA-LAD, SVG-OM1, SVG-PDA and SVG-D1  . CHF (congestive heart failure) (HCC)    a. EF < 20% by echo in 11/2019  . COPD (chronic obstructive pulmonary disease) (HCC)   . Essential hypertension   . PAF (paroxysmal atrial fibrillation) (HCC)   . Type 2 diabetes mellitus (HCC)      Assessment: 49 yoM admitted with CP and cardiogenic shock. Pt has hx AFib with no AC PTA, pharmacy to start  IV heparin.   Impella 5.5 placed 12/3.  Heparin level therapeutic yesterday but then bleeding at insertion site last pm so heparin drip decreased with plan to keep HL < 0.3. IV heparin systemically at 750units/hr + heparin in purge solution at 600 units/hr. H/h and pltc stable LDH in low 200s HL 0.14  Goal  of Therapy:  Heparin level < 0.3 12/5 pm Monitor platelets by anticoagulation protocol: Yes   Plan:  No changes to heparin systemic drip 750 uts/hr Will check HL with am labs   [employer] Pharm.D. CPP, BCPS Clinical Pharmacist [phone] 11/13/2020 3:54 PM    [facility] pharmacy phone numbers are listed on [email or web address]

## 2020-11-13 NOTE — Progress Notes (Signed)
VASCULAR LAB    Pre Vad Dopplers has been performed.  See CV proc for preliminary results.   Mccall Lomax, RVT 11/13/2020, 4:00 PM

## 2020-11-13 NOTE — Progress Notes (Addendum)
Patient ID: Kevin Underwood, male   DOB: 1961/11/23, 59 y.o.   MRN: [id number]     Advanced Heart Failure Rounding Note  PCP-Cardiologist: Nona Dell, MD   Subjective:    - 12/2 Had cath as noted below with intact revascularization. CO-OX 39%, started on milrinone.  - 12/3 Impella 5.5 placed   Remains on Impella 5.5 at P-7, milrinone 0.375. Off NE. He diuresed well yesterday, net negative -3157.  Stable creatinine 0.85.   Hematoma at Impella site on CTA 12/5 without active bleeding. Hgb stable 10.4 today.    On lasix 80 IV tid. Good urine output but CVP remains 16. Denies SOB, orthopnea or PND.  Only complaint is pain at Impella site after pressure held yesterday.   Swan CVP 16 PA 57/25 Thermo CI 3.3 PAPi 2.3 Co-ox 48%  Impella 5.5 P-7 => P-8 Flow 3.5 L => 4.1 L Waveforms ok LDH 190 => 214  RHC/LHC 11/09/20  RA 20 PCWP 30-35 PA Sat 40% CO 3 CI 1.5.  1. Severe native coronary artery disease, similar to prior catheterization in 11/2019.  2. Widely patent LIMA-LAD, SVG-D1, and sequential SVG-OM3-rPDA. 3. Severely elevated left and right heart filling pressures. 4. Severely reduced cardiac output/index. 5. Successful placement of leave-in PA catheter via the right internal jugular vein.   Objective:   Weight Range: 80.5 kg Body mass index is 25.46 kg/m.   Vital Signs:   Temp:  [97.3 F (36.3 C)-98.8 F (37.1 C)] 98.8 F (37.1 C) (12/06 0618) Pulse Rate:  [34-116] 83 (12/06 0618) Resp:  [13-23] 13 (12/06 0618) BP: (91-125)/(62-96) 114/87 (12/06 0600) SpO2:  [93 %-100 %] 100 % (12/06 0811) Arterial Line BP: (86-135)/(59-93) 117/76 (12/06 0618) Weight:  [80.5 kg] 80.5 kg (12/06 0500) Last BM Date: 11/08/20  Weight change: Filed Weights   11/11/20 0300 11/12/20 0500 11/13/20 0500  Weight: 85.7 kg 85.9 kg 80.5 kg    Intake/Output:   Intake/Output Summary (Last 24 hours) at 11/13/2020 0834 Last data filed at 11/13/2020 0600 Gross per 24 hour  Intake  2230.93 ml  Output 5600 ml  Net -3369.07 ml      Physical Exam   General: NAD Neck: JVP 14+, no thyromegaly or thyroid nodule.  Lungs: Clear anteriorly CV: Nondisplaced PMI.  Heart regular S1/S2, no S3/S4, no murmur.  No peripheral edema.   Abdomen: Soft, nontender, no hepatosplenomegaly, no distention.  Skin: Mild swelling at Impella site.  Neurologic: Alert and oriented x 3.  Psych: Normal affect. Extremities: No clubbing or cyanosis.  HEENT: Normal.    Telemetry   NSR 70s,  Personally reviewed   Labs    CBC Recent Labs    11/10/20 2224 11/11/20 0503 11/12/20 1851 11/13/20 0454  WBC 13.3*   < > 8.5 10.5  NEUTROABS 12.2*  --   --   --   HGB 12.3*   < > 10.0* 10.4*  HCT 36.6*   < > 29.0* 29.8*  MCV 84.3   < > 84.5 84.9  PLT 177   < > 120* 126*   < > = values in this interval not displayed.   Basic Metabolic Panel Recent Labs    14/43/15 2224 11/10/20 2224 11/11/20 0503 11/11/20 0503 11/12/20 0515 11/12/20 0515 11/12/20 1845 11/13/20 0454  NA 132*   < > 131*   < > 130*   < > 135 130*  K 3.9   < > 4.0   < > 3.5   < > 3.4* 4.2  CL 97*   < > 95*   < > 93*   < > 100 93*  CO2 22   < > 23   < > 28  --   --  28  GLUCOSE 211*   < > 232*   < > 126*   < > 226* 170*  BUN 23*   < > 21*   < > 15   < > 12 11  CREATININE 1.08   < > 0.90   < > 0.76   < > 0.70 0.85  CALCIUM 8.4*   < > 8.4*   < > 8.2*  --   --  8.6*  MG 2.0  --  2.0  --   --   --   --   --    < > = values in this interval not displayed.   Liver Function Tests Recent Labs    11/10/20 2224  AST 21  ALT 26  ALKPHOS 81  BILITOT 1.1  PROT 5.8*  ALBUMIN 3.0*   No results for input(s): LIPASE, AMYLASE in the last 72 hours. Cardiac Enzymes No results for input(s): CKTOTAL, CKMB, CKMBINDEX, TROPONINI in the last 72 hours.  BNP: BNP (last 3 results) Recent Labs    11/25/19 0500 09/22/20 0348 11/08/20 1344  BNP 596.1* 939.0* 910.0*    ProBNP (last 3 results) No results for input(s): PROBNP  in the last 8760 hours.   D-Dimer No results for input(s): DDIMER in the last 72 hours. Hemoglobin A1C Recent Labs    11/10/20 2248  HGBA1C 10.4*   Fasting Lipid Panel Recent Labs    11/10/20 2224  CHOL 102  HDL 40*  LDLCALC 53  TRIG 43  CHOLHDL 2.6   Thyroid Function Tests Recent Labs    11/10/20 2224  TSH 1.053    Other results:   Imaging    CT ANGIO CHEST AORTA W/CM & OR WO/CM  Addendum Date: 11/12/2020   ADDENDUM REPORT: 11/12/2020 20:08 ADDENDUM: In speaking with the provider, the outpouching is likely filling of graft material with contrast and is not actually a pseudoaneurysm. The additional findings including a surrounding hematoma with no active bleeding remain the same. Electronically Signed   By: Katherine Mantle M.D.   On: 11/12/2020 20:08   Result Date: 11/12/2020 CLINICAL DATA:  Chest pain.  Suspected bleeding at Impella site. EXAM: CT ANGIOGRAPHY CHEST WITH CONTRAST TECHNIQUE: Multidetector CT imaging of the chest was performed using the standard protocol during bolus administration of intravenous contrast. Multiplanar CT image reconstructions and MIPs were obtained to evaluate the vascular anatomy. CONTRAST:  OMNIPAQUE IOHEXOL 350 MG/ML SOLN COMPARISON:  CT dated 11/10/2020 FINDINGS: Cardiovascular: Contrast injection is sufficient to demonstrate satisfactory opacification of the pulmonary arteries to the segmental level. There is no pulmonary embolus or evidence of right heart strain. There are atherosclerotic changes of the thoracic aorta without evidence for a dissection or aneurysm. There is mild narrowing at the origin of the left subclavian artery. A left subclavian approach Impella device is noted. At its insertion within the left subclavian/axillary artery, there appears to be a focal outpouching of contrast concerning for atraumatic pseudoaneurysm. There is a surrounding moderate-sized hematoma. There is no evidence for active arterial bleeding.  Pockets of subcutaneous gas are noted in the left chest wall and axilla. The patient is status post prior CABG. The heart size remains enlarged. There is a right-sided Swan-Ganz catheter in place with tip terminating in the right main pulmonary  artery. Mediastinum/Nodes: --there is mild mediastinal adenopathy. --there is mild hilar adenopathy. -- No axillary lymphadenopathy. -- No supraclavicular lymphadenopathy. -- Normal thyroid gland where visualized. -  Unremarkable esophagus. Lungs/Pleura: There are small bilateral pleural effusions adjacent atelectasis. There are emphysematous changes bilaterally. There is no evidence for a pneumothorax. Upper Abdomen: Contrast bolus timing is not optimized for evaluation of the abdominal organs. The visualized portions of the organs of the upper abdomen are normal. Musculoskeletal: No chest wall abnormality. No bony spinal canal stenosis. Review of the MIP images confirms the above findings. IMPRESSION: 1. At the insertion site of the left subclavian/axillary approach Impella, there is a focal outpouching of contrast which is suspicious for a developing pseudoaneurysm. There is a moderate-sized surrounding hematoma without evidence for active arterial bleeding. 2. No acute pulmonary embolism. 3. Small bilateral pleural effusions with adjacent atelectasis. 4. Additional chronic and postsurgical changes as detailed above. Aortic Atherosclerosis (ICD10-I70.0) and Emphysema (ICD10-J43.9). Electronically Signed: By: Katherine Mantle M.D. On: 11/12/2020 19:57     Medications:     Scheduled Medications: . aspirin EC  81 mg Oral Daily  . Chlorhexidine Gluconate Cloth  6 each Topical Daily  . digoxin  0.125 mg Oral Daily  . feeding supplement (GLUCERNA SHAKE)  237 mL Oral BID BM  . furosemide  80 mg Intravenous Once  . gabapentin  600 mg Oral TID  . insulin aspart  0-15 Units Subcutaneous TID WC  . insulin aspart  0-5 Units Subcutaneous QHS  . insulin aspart  4 Units  Subcutaneous TID WC  . insulin glargine  20 Units Subcutaneous QHS  . magnesium oxide  400 mg Oral Daily  . mouth rinse  15 mL Mouth Rinse BID  . metolazone  2.5 mg Oral Once  . mometasone-formoterol  2 puff Inhalation BID  . multivitamin with minerals  1 tablet Oral Daily  . mupirocin ointment  1 application Nasal BID  . pantoprazole  40 mg Oral Daily  . potassium chloride  40 mEq Oral Once  . rosuvastatin  40 mg Oral q1800  . sodium chloride flush  10-40 mL Intracatheter Q12H  . sodium chloride flush  3 mL Intravenous Q12H  . sodium chloride flush  3 mL Intravenous Q12H  . spironolactone  12.5 mg Oral Daily  . traZODone  50 mg Oral QHS    Infusions: . sodium chloride 10 mL/hr at 11/13/20 0500  . amiodarone 30 mg/hr (11/13/20 0500)  . ferumoxytol    . furosemide (LASIX) 200 mg in dextrose 5% 100 mL (2mg /mL) infusion    . heparin 750 Units/hr (11/13/20 0500)  . impella catheter heparin 50 unit/mL in dextrose 5%    . milrinone 0.375 mcg/kg/min (11/13/20 0500)  . nitroGLYCERIN Stopped (11/12/20 1041)  . norepinephrine (LEVOPHED) Adult infusion    . vasopressin      PRN Medications: sodium chloride, acetaminophen, albuterol, LORazepam, ondansetron (ZOFRAN) IV, oxyCODONE-acetaminophen, sodium chloride flush, sodium chloride flush, traMADol    Patient Profile   Kevin Underwood a [place] [age] y.o.malewith a history of systolic HF, 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 type 2 diabetes mellitus, COPD, and hypertension.   Assessment/Plan   1. Acute on chronic systolic CHF with cardiogenic shock: Echo 10/21 with EF 20-25%, mildly decreased RV function (persistently low). LHC/RHC this admission with patent grafts, low output. Suspect mixed ischemic/nonischemic cardiomyopathy (prior heavy ETOH and drugs  as well as CAD).  No ETOH, drugs, smoking for  about 1 year since CABG in 12/20. Now s/p placement of Impella 5.5.  Hematoma at site, CT 12/5 no active bleeding and hgb relatively stable.  Stable LDH.  Elevated CVP and PAP suggest ongoing volume overload.  Co-ox low today at 46% but good CI 3.3.  - Increased Impella speed back to P8.  Limited echo for position today => adequate position 4.8 cm.  - Continue milrinone 0.375.  - Lasix 80 mg IV bolus then 12 mg/hr gtt.  Will give metolazone 2.5 x 1.   - Continue digoxin 0.125 daily and spironolactone 12.5 daily.  - LVAD workup ongoing, I think he will be candidate.  LVAD coordinators and surgeon have seen.  Not sure he would make it to transplant with poor diabetes control and social issues. Needs dental consult.   2. CAD: S/p CABG 12/20.  LHC this admission with patent grafts, no target for intervention.  - Continue ASA and statin.  3. Atrial fibrillation: Paroxysmal.  S/p Maze and LA appendage clip with CABG in 12/20.  In NSR with PACs currently.  - Continue amiodarone gtt.  - Start heparin gtt.  4. Type 2 diabetes: A1c 10.4, poor control.  Continue insulin.  5. Prior ETOH, drugs: None x 1 year.  6. Thrombocytopenia: Mildly decreased.  Likely due to low level hemolysis.  Follow closely.  7. Hyponatremia: Na 130, restrict free water.   CRITICAL CARE Performed by: Marca Ancona  Total critical care time: 45 minutes  Critical care time was exclusive of separately billable procedures and treating other patients.  Critical care was necessary to treat or prevent imminent or life-threatening deterioration.  Critical care was time spent personally by me (independent of midlevel providers or residents) on the following activities: development of treatment plan with patient and/or surrogate as well as nursing, discussions with consultants, evaluation of patient's response to treatment, examination of patient, obtaining history from patient or surrogate, ordering and performing treatments and  interventions, ordering and review of laboratory studies, ordering and review of radiographic studies, pulse oximetry and re-evaluation of patient's condition.  Length of Stay: 5  Marca Ancona, MD  11/13/2020, 8:34 AM  Advanced Heart Failure Team Pager [phone] (M-F; 7a - 4p)  Please contact [facility] for night-coverage after hours (4p -7a ) and weekends on [email or web address]

## 2020-11-13 NOTE — Progress Notes (Signed)
VAD Coordinator met with patient at bedside to answer any additional questions re: LVAD. Pt denies any further questions, says he is ready for VAD if approved by team.   He confirms he has 24/7 caregiver at home.   VAD patient scheduled to meet with him today at @ 11:00; pt verbalizes agreement to same.   Bedside spirometry scheduled for later this afternoon.   Dr. Aundra Dubin will present Mr. Whitenack at [facility] meeting later today; pt verbalized understanding of same.   Zada Girt RN, VAD Coordinator 24/7 VAD Pager: [phone]

## 2020-11-13 NOTE — Progress Notes (Signed)
Inpatient Diabetes Program Recommendations  AACE/ADA: New Consensus Statement on Inpatient Glycemic Control (2015)  Target Ranges:  Prepandial:   less than 140 mg/dL      Peak postprandial:   less than 180 mg/dL (1-2 hours)      Critically ill patients:  140 - 180 mg/dL   Lab Results  Component Value Date   GLUCAP 176 (H) 11/13/2020   HGBA1C 10.4 (H) 11/10/2020    Review of Glycemic Control Results for Kevin Underwood, Kevin Underwood (MRN [id number]) as of 11/13/2020 09:45  Ref. Range 11/12/2020 06:55 11/12/2020 11:14 11/12/2020 15:23 11/12/2020 21:05 11/13/2020 07:06  Glucose-Capillary Latest Ref Range: 70 - 99 mg/dL 453 (H) 646 (H) 803 (H) 199 (H) 176 (H)   Diabetes history: DM2 Outpatient Diabetes medications: Metformin 1000 mg BID, Farxiga 5 mg QD Current orders for Inpatient glycemic control: Lantus 20 units + Novolog 4 units tid meal coverage + Novolog moderate correction tid + hs 0-5 units  Inpatient Diabetes Program Recommendations:   Consider: -Increase Novolog meal coverage to 5 units tid if eats 50% -Decrease Novolog correction to sensitive tid + hs 0-5 units Will continue to follow during hospitalization.  Thank you, Billy Fischer. Emit Kuenzel, RN, MSN, CDE  Diabetes Coordinator Inpatient Glycemic Control Team Team Pager [phone] (8am-5pm) 11/13/2020 9:55 AM

## 2020-11-14 ENCOUNTER — Inpatient Hospital Stay (HOSPITAL_COMMUNITY): Payer: 59

## 2020-11-14 LAB — BASIC METABOLIC PANEL
Anion gap: 10 (ref 5–15)
BUN: 16 mg/dL (ref 6–20)
CO2: 33 mmol/L — ABNORMAL HIGH (ref 22–32)
Calcium: 9.2 mg/dL (ref 8.9–10.3)
Chloride: 83 mmol/L — ABNORMAL LOW (ref 98–111)
Creatinine, Ser: 0.95 mg/dL (ref 0.61–1.24)
GFR, Estimated: >60 mL/min (ref >60)
Glucose, Bld: 260 mg/dL — ABNORMAL HIGH (ref 70–99)
Potassium: 3.5 mmol/L (ref 3.5–5.1)
Sodium: 126 mmol/L — ABNORMAL LOW (ref 135–145)

## 2020-11-14 LAB — CBC
HCT: 34.5 % — ABNORMAL LOW (ref 39.0–52.0)
Hemoglobin: 11.5 g/dL — ABNORMAL LOW (ref 13.0–17.0)
MCH: 28 pg (ref 26.0–34.0)
MCHC: 33.3 g/dL (ref 30.0–36.0)
MCV: 84.1 fL (ref 80.0–100.0)
Platelets: 129 10*3/uL — ABNORMAL LOW (ref 150–400)
RBC: 4.1 MIL/uL — ABNORMAL LOW (ref 4.22–5.81)
RDW: 14.6 % (ref 11.5–15.5)
WBC: 10.6 10*3/uL — ABNORMAL HIGH (ref 4.0–10.5)
nRBC: 0 % (ref 0.0–0.2)

## 2020-11-14 LAB — COOXEMETRY PANEL
Carboxyhemoglobin: 1.6 % — ABNORMAL HIGH (ref 0.5–1.5)
Methemoglobin: 0.9 % (ref 0.0–1.5)
O2 Saturation: 53.8 %
Total hemoglobin: 13.7 g/dL (ref 12.0–16.0)

## 2020-11-14 LAB — LACTATE DEHYDROGENASE: LDH: 239 U/L — ABNORMAL HIGH (ref 98–192)

## 2020-11-14 LAB — GLUCOSE, CAPILLARY
Glucose-Capillary: 226 mg/dL — ABNORMAL HIGH (ref 70–99)
Glucose-Capillary: 264 mg/dL — ABNORMAL HIGH (ref 70–99)
Glucose-Capillary: 208 mg/dL — ABNORMAL HIGH (ref 70–99)
Glucose-Capillary: 304 mg/dL — ABNORMAL HIGH (ref 70–99)

## 2020-11-14 LAB — HEPARIN LEVEL (UNFRACTIONATED): Heparin Unfractionated: <0.1 IU/mL — ABNORMAL LOW (ref 0.30–0.70)

## 2020-11-14 MED ORDER — GLUCERNA SHAKE PO LIQD
237.0000 mL | Freq: Three times a day (TID) | ORAL | Status: DC
  Administered 2020-11-14 – 2020-11-16 (×6): 237 mL via ORAL

## 2020-11-14 MED ORDER — POTASSIUM CHLORIDE CRYS ER 20 MEQ PO TBCR
40.0000 meq | EXTENDED_RELEASE_TABLET | Freq: Once | ORAL | Status: AC
  Administered 2020-11-14: 40 meq via ORAL
  Filled 2020-11-14: qty 2

## 2020-11-14 MED FILL — Verapamil HCl IV Soln 2.5 MG/ML: INTRAVENOUS | Qty: 2 | Status: AC

## 2020-11-14 NOTE — Progress Notes (Signed)
Nutrition Follow Up  DOCUMENTATION CODES:   Non-severe (moderate) malnutrition in context of chronic illness  INTERVENTION:    Glucerna Shake po TID, each supplement provides 220 kcal and 10 grams of protein  MVI daily   NUTRITION DIAGNOSIS:   Moderate Malnutrition related to chronic illness (uncontrolled DM, CHF) as evidenced by mild fat depletion, moderate muscle depletion, energy intake < 75% for > or equal to 1 month.  GOAL:   Patient will meet greater than or equal to 90% of their needs  MONITOR:   PO intake, Supplement acceptance, Weight trends, Labs, I & O's  REASON FOR ASSESSMENT:   Consult LVAD Eval  ASSESSMENT:   59 year old male who presented to the ED on 12/01 with chest pain. PMH of CAD s/p CABG December 2020, CHF, HTN, HLD, EtOH and cocaine abuse (substance-free over the last year), T2DM, COPD.   12/2- right/left heart cath 12/3- impella  Pt discussed during ICU rounds and with RN.   Pt endorses a loss in appetite over the last 3-4 weeks due to ongoing back pain. States during this time he consumes bites and sips at meals. Prior to this pt consumed three meals daily that consisted of B- egg, cheese sandwich L- chicken noodle soup, ham/cheese sandwich D- hamburger helper, potatoes, corn. Does not use supplementation. Pt unsure of home diabetes medication regimen. Noted A1c of 10.4%.   Appetite progressed this admission. Last 6 meal completions charted as 100%. Likes Glucerna vs Ensure. Increase form BID to TID to maximize protein pre surgery. Pt reports only having 10 teeth, but this does not affect his chewing ability.  May require dental extractions prior to VAD placement. Will make diet texture adjustments as needed.   Pt reports a UBW of 260 lb 4-5 years ago. Gradually lost down to 180-190 lb due to starting a physical job. Unsure of recent weight loss.   Admission weight: 81.7 kg  Current weight: 79 kg   UOP: 8505 ml x 24 hrs   Medications: SS  novolog, lantus, mag-ox, aldactone Labs: Na 126 (L) CBG 126-313  NUTRITION - FOCUSED PHYSICAL EXAM:    Most Recent Value  Orbital Region No depletion  Upper Arm Region Moderate depletion  Thoracic and Lumbar Region No depletion  Buccal Region Mild depletion  Temple Region Mild depletion  Clavicle Bone Region Mild depletion  Clavicle and Acromion Bone Region Mild depletion  Scapular Bone Region Mild depletion  Dorsal Hand Mild depletion  Patellar Region Moderate depletion  Anterior Thigh Region Moderate depletion  Posterior Calf Region Moderate depletion  Edema (RD Assessment) Mild  Hair Reviewed  Eyes Reviewed  Mouth Reviewed  Skin Reviewed  Nails Reviewed     Diet Order:   Diet Order            Diet heart healthy/carb modified Room service appropriate? Yes; Fluid consistency: Thin  Diet effective now                 EDUCATION NEEDS:   Education needs have been addressed  Skin:  Skin Assessment: Skin Integrity Issues: Skin Integrity Issues:: Incisions Incisions: L chest  Last BM:  12/1  Height:   Ht Readings from Last 1 Encounters:  11/08/20 5\' 10"  (1.778 m)    Weight:   Wt Readings from Last 1 Encounters:  11/14/20 79 kg    Ideal Body Weight:  75.5 kg  BMI:  Body mass index is 24.99 kg/m.  Estimated Nutritional Needs:   Kcal:  2300-2500  Protein:  115-130 grams  Fluid:  2.0 L/day  Vanessa Kick RD, LDN Clinical Nutrition Pager listed in AMION

## 2020-11-14 NOTE — Progress Notes (Signed)
This chaplain is present with the Pt. for spiritual care and completing of the Pt. Advance Directive.  The Pt. is sitting up bedside and looking forward to returning to his bed.  The Pt. expresses an interest in completing an AD with the chaplain.    This chaplain completed AD education and filled out the form with the Pt. for his HCPOA and Living Will.  The Pt. prefers the chaplain return Wednesday morning for notarizing of the AD.  The document was placed on the shelf to the left of the sink.  The chaplain is available for F/U spiritual care as needed.

## 2020-11-14 NOTE — Progress Notes (Addendum)
Patient ID: Kevin Underwood, male   DOB: 1961-07-27, 59 y.o.   MRN: [id number]     Advanced Heart Failure Rounding Note  PCP-Cardiologist: Nona Dell, MD   Subjective:    - 12/2 Had cath as noted below with intact revascularization. CO-OX 39%, started on milrinone.  - 12/3 Impella 5.5 placed -12/6 Impella speed increased to 8. Started on lasix drip and given metolazone. Brisk diuresis noted. Negative 5 liters.   Remain on milrinone 0.375 mcg + amio drip.    Hematoma at Impella site on CTA 12/5 without active bleeding. Hgb stable 11. 5 today.    Having some dizziness.   [place] CVP 1 CI 2.6.   Impella 5.5 P-8 Flow 4 L LDH 190 => 214=>239   RHC/LHC 11/09/20  RA 20 PCWP 30-35 PA Sat 40% CO 3 CI 1.5.  1. Severe native coronary artery disease, similar to prior catheterization in 11/2019.  2. Widely patent LIMA-LAD, SVG-D1, and sequential SVG-OM3-rPDA. 3. Severely elevated left and right heart filling pressures. 4. Severely reduced cardiac output/index. 5. Successful placement of leave-in PA catheter via the right internal jugular vein.   Objective:   Weight Range: 79 kg Body mass index is 24.99 kg/m.   Vital Signs:   Temp:  [97.3 F (36.3 C)-100 F (37.8 C)] 97.7 F (36.5 C) (12/07 0700) Pulse Rate:  [58-116] 88 (12/07 0700) Resp:  [11-35] 22 (12/07 0700) BP: (85-143)/(70-99) 113/70 (12/07 0600) SpO2:  [71 %-100 %] 95 % (12/07 0700) Arterial Line BP: (84-139)/(42-98) 121/82 (12/07 0700) Weight:  [79 kg] 79 kg (12/07 0545) Last BM Date: 11/08/20  Weight change: Filed Weights   11/12/20 0500 11/13/20 0500 11/14/20 0545  Weight: 85.9 kg 80.5 kg 79 kg    Intake/Output:   Intake/Output Summary (Last 24 hours) at 11/14/2020 0712 Last data filed at 11/14/2020 0645 Gross per 24 hour  Intake 3331.28 ml  Output 8505 ml  Net -5173.72 ml      Physical Exam  CVP 1  General: . No resp difficulty HEENT: normal Neck: supple. no JVD. Carotids 2+ bilat; no  bruits. No lymphadenopathy or thryomegaly appreciated. Cor: PMI nondisplaced. Regular rate & rhythm. No rubs, gallops or murmurs. L axillary impella.  Lungs: clear Abdomen: soft, nontender, nondistended. No hepatosplenomegaly. No bruits or masses. Good bowel sounds. Extremities: no cyanosis, clubbing, rash, edema. RUE A line.  Neuro: alert & orientedx3, cranial nerves grossly intact. moves all 4 extremities w/o difficulty. Affect pleasant   Telemetry   NSR 70-80s personally reviewed.    Labs    CBC Recent Labs    11/13/20 0454 11/13/20 0454 11/13/20 1536 11/14/20 0343  WBC 10.5  --   --  10.6*  HGB 10.4*   < > 11.9* 11.5*  HCT 29.8*   < > 35.0* 34.5*  MCV 84.9  --   --  84.1  PLT 126*  --   --  129*   < > = values in this interval not displayed.   Basic Metabolic Panel Recent Labs    40/98/11 1332 11/13/20 1332 11/13/20 1536 11/14/20 0343  NA 127*   < > 129* 126*  K 3.9   < > 3.9 3.5  CL 88*  --   --  83*  CO2 28  --   --  33*  GLUCOSE 212*  --   --  260*  BUN 12  --   --  16  CREATININE 0.96  --   --  0.95  CALCIUM  8.9  --   --  9.2   < > = values in this interval not displayed.   Liver Function Tests No results for input(s): AST, ALT, ALKPHOS, BILITOT, PROT, ALBUMIN in the last 72 hours. No results for input(s): LIPASE, AMYLASE in the last 72 hours. Cardiac Enzymes No results for input(s): CKTOTAL, CKMB, CKMBINDEX, TROPONINI in the last 72 hours.  BNP: BNP (last 3 results) Recent Labs    11/25/19 0500 09/22/20 0348 11/08/20 1344  BNP 596.1* 939.0* 910.0*    ProBNP (last 3 results) No results for input(s): PROBNP in the last 8760 hours.   D-Dimer No results for input(s): DDIMER in the last 72 hours. Hemoglobin A1C No results for input(s): HGBA1C in the last 72 hours. Fasting Lipid Panel No results for input(s): CHOL, HDL, LDLCALC, TRIG, CHOLHDL, LDLDIRECT in the last 72 hours. Thyroid Function Tests No results for input(s): TSH, T4TOTAL,  T3FREE, THYROIDAB in the last 72 hours.  Invalid input(s): FREET3  Other results:   Imaging    VAS US DOPPLER PRE VAD  Result Date: 11/13/2020 PERIOPERATIVE VASCULAR EVALUATION Indications:      Pre operative VAD workup. Risk Factors:     Hypertension, Diabetes, coronary artery disease. Other Factors:    CABG 12/20, Atrial fibrillation, COPD, CHF, Ischemic                   cardiomyopathy, History of cocaine use, clean for 4 years. Limitations:      Line in right IJ, talking interference Comparison Study: No prior study Performing Technologist: Sherren Kerns RVS  Examination Guidelines: A complete evaluation includes B-mode imaging, spectral Doppler, color Doppler, and power Doppler as needed of all accessible portions of each vessel. Bilateral testing is considered an integral part of a complete examination. Limited examinations for reoccurring indications may be performed as noted.  Right Carotid Findings: +----------+--------+--------+--------+--------------------------+--------+           PSV cm/sEDV cm/sStenosisDescribe                  Comments +----------+--------+--------+--------+--------------------------+--------+ CCA Prox  65      24              heterogenous and irregular         +----------+--------+--------+--------+--------------------------+--------+ CCA Distal69      26              heterogenous                       +----------+--------+--------+--------+--------------------------+--------+ ICA Prox  112     35      1-39%   heterogenous                       +----------+--------+--------+--------+--------------------------+--------+ ICA Distal68      38                                                 +----------+--------+--------+--------+--------------------------+--------+ Portions of this table do not appear on this page. +----------+--------+-------+------------+------------+           PSV cm/sEDV cmsDescribe    Arm Pressure  +----------+--------+-------+------------+------------+ Subclavian               Not assessed114          +----------+--------+-------+------------+------------+ +---------+--------+--------+------------+ VertebralPSV cm/sEDV cm/sNot assessed +---------+--------+--------+------------+ Left Carotid Findings: +----------+--------+--------+--------+------------+--------+  PSV cm/sEDV cm/sStenosisDescribe    Comments +----------+--------+--------+--------+------------+--------+ CCA Prox  100     42              homogeneous          +----------+--------+--------+--------+------------+--------+ CCA Distal87      29              homogeneous          +----------+--------+--------+--------+------------+--------+ ICA Prox  104     49      1-39%   heterogenous         +----------+--------+--------+--------+------------+--------+ ICA Distal79      38                                   +----------+--------+--------+--------+------------+--------+ ECA       87      21                                   +----------+--------+--------+--------+------------+--------+ +----------+--------+--------+------------+------------+ SubclavianPSV cm/sEDV cm/sDescribe    Arm Pressure +----------+--------+--------+------------+------------+                           Not assessed108          +----------+--------+--------+------------+------------+ +---------+--------+--------+------------+ VertebralPSV cm/sEDV cm/sNot assessed +---------+--------+--------+------------+  ABI Findings: +--------+------------------+-----+-----------+--------+ Right   Rt Pressure (mmHg)IndexWaveform   Comment  +--------+------------------+-----+-----------+--------+ [id number]                    triphasic           +--------+------------------+-----+-----------+--------+ PTA     109               0.96 multiphasic          +--------+------------------+-----+-----------+--------+ DP      105               0.92 multiphasic         +--------+------------------+-----+-----------+--------+ +--------+------------------+-----+-----------+-------+ Left    Lt Pressure (mmHg)IndexWaveform   Comment +--------+------------------+-----+-----------+-------+ [id number]                    triphasic          +--------+------------------+-----+-----------+-------+ PTA     109               0.96 multiphasic        +--------+------------------+-----+-----------+-------+ DP      108               0.95 multiphasic        +--------+------------------+-----+-----------+-------+ +-------+---------------+----------------+ ABI/TBIToday's ABI/TBIPrevious ABI/TBI +-------+---------------+----------------+ Right  0.96                            +-------+---------------+----------------+ Left   0.96                            +-------+---------------+----------------+  Summary: Right Carotid: Velocities in the right ICA are consistent with a 1-39% stenosis. Left Carotid: Velocities in the left ICA are consistent with a 1-39% stenosis.  *See table(s) above for measurements and observations. Right ABI: Resting right ankle-brachial index is within normal range. No evidence of significant right lower extremity arterial disease. Left ABI: Resting left ankle-brachial index is within normal range. No evidence of significant left lower  extremity arterial disease.  Electronically signed by Fabienne Bruns MD on 11/13/2020 at 5:03:22 PM.    Final    VAS [place] LOWER EXTREMITY VENOUS (DVT)  Result Date: 11/13/2020  Lower Venous DVT Study Indications: Pre operative VAD.  Comparison Study: Prior negative RLE venous duplex from 11/21/19 is available                   for comparison Performing Technologist: Sherren Kerns RVS  Examination Guidelines: A complete evaluation includes B-mode imaging, spectral Doppler, color Doppler, and power  Doppler as needed of all accessible portions of each vessel. Bilateral testing is considered an integral part of a complete examination. Limited examinations for reoccurring indications may be performed as noted. The reflux portion of the exam is performed with the patient in reverse Trendelenburg.  +---------+---------------+---------+-----------+----------+--------------+ RIGHT    CompressibilityPhasicitySpontaneityPropertiesThrombus Aging +---------+---------------+---------+-----------+----------+--------------+ CFV      Full           Yes      Yes                                 +---------+---------------+---------+-----------+----------+--------------+ SFJ      Full                                                        +---------+---------------+---------+-----------+----------+--------------+ FV Prox  Full                                                        +---------+---------------+---------+-----------+----------+--------------+ FV Mid   Full                                                        +---------+---------------+---------+-----------+----------+--------------+ FV DistalFull                                                        +---------+---------------+---------+-----------+----------+--------------+ PFV      Full                                                        +---------+---------------+---------+-----------+----------+--------------+ POP      Full           Yes      Yes                                 +---------+---------------+---------+-----------+----------+--------------+ PTV      Full                                                        +---------+---------------+---------+-----------+----------+--------------+  PERO     Full                                                        +---------+---------------+---------+-----------+----------+--------------+    +---------+---------------+---------+-----------+----------+--------------+ LEFT     CompressibilityPhasicitySpontaneityPropertiesThrombus Aging +---------+---------------+---------+-----------+----------+--------------+ CFV      Full           Yes      Yes                                 +---------+---------------+---------+-----------+----------+--------------+ SFJ      Full                                                        +---------+---------------+---------+-----------+----------+--------------+ FV Prox  Full                                                        +---------+---------------+---------+-----------+----------+--------------+ FV Mid   Full                                                        +---------+---------------+---------+-----------+----------+--------------+ FV DistalFull                                                        +---------+---------------+---------+-----------+----------+--------------+ PFV      Full                                                        +---------+---------------+---------+-----------+----------+--------------+ POP      Full           Yes      Yes                                 +---------+---------------+---------+-----------+----------+--------------+ PTV      Full                                                        +---------+---------------+---------+-----------+----------+--------------+ PERO     Full                                                        +---------+---------------+---------+-----------+----------+--------------+  Summary: BILATERAL: - No evidence of deep vein thrombosis seen in the lower extremities, bilaterally. -   *See table(s) above for measurements and observations. Electronically signed by Fabienne Bruns MD on 11/13/2020 at 5:01:07 PM.    Final    ECHOCARDIOGRAM LIMITED  Result Date: 11/13/2020    ECHOCARDIOGRAM LIMITED REPORT   Patient Name:    ERIS BRECK Date of Exam: 11/13/2020 Medical Rec #:  [id number]        Height:       70.0 in Accession #:    [id number]       Weight:       177.5 lb Date of Birth:  Feb 21, 1961        BSA:          1.984 m Patient Age:    59 years         BP:           114/86 mmHg Patient Gender: M                HR:           82 bpm. Exam Location:  Inpatient Procedure: Limited Echo Indications:    I50.23 Acute on chronic systolic (congestive) heart failure  History:        Patient has prior history of Echocardiogram examinations, most                 recent 11/10/2020. CAD, COPD, Arrythmias:Atrial Fibrillation;                 Risk Factors:Hypertension and Diabetes.  Sonographer:    Elmarie Shiley Dance Referring Phys: 64 Mehar Kirkwood S Trayden Brandy IMPRESSIONS  1. Limited echo for impella placement. The impella tip is 4.6-4.8 cm from the AoV. It appears fairly deep on A4C views. Left ventricular ejection fraction, by estimation, is 10-15%. The left ventricle has severely decreased function. The left ventricle demonstrates global hypokinesis.  2. Right ventricular systolic function is mildly reduced. The right ventricular size is mildly enlarged.  3. Left atrial size was mildly dilated. FINDINGS  Left Ventricle: Limited echo for impella placement. The impella tip is 4.6-4.8 cm from the AoV. It appears fairly deep on A4C views. Left ventricular ejection fraction, by estimation, is 10-15%. The left ventricle has severely decreased function. The left ventricle demonstrates global hypokinesis. Right Ventricle: The right ventricular size is mildly enlarged. No increase in right ventricular wall thickness. Right ventricular systolic function is mildly reduced. Left Atrium: Left atrial size was mildly dilated. Right Atrium: Right atrial size was normal in size. Pericardium: Trivial pericardial effusion is present. Additional Comments: A venous catheter is visualized in the right ventricle. There is a small pleural effusion in the left lateral region.  Lennie Odor MD Electronically signed by Lennie Odor MD Signature Date/Time: 11/13/2020/11:32:32 AM    Final      Medications:     Scheduled Medications: . aspirin EC  81 mg Oral Daily  . chlorhexidine  15 mL Mouth/Throat BID  . Chlorhexidine Gluconate Cloth  6 each Topical Daily  . digoxin  0.125 mg Oral Daily  . feeding supplement (GLUCERNA SHAKE)  237 mL Oral BID BM  . gabapentin  600 mg Oral TID  . insulin aspart  0-15 Units Subcutaneous TID WC  . insulin aspart  0-5 Units Subcutaneous QHS  . insulin aspart  4 Units Subcutaneous TID WC  . insulin glargine  20 Units Subcutaneous QHS  . magnesium oxide  400 mg Oral Daily  .  mouth rinse  15 mL Mouth Rinse BID  . mometasone-formoterol  2 puff Inhalation BID  . multivitamin with minerals  1 tablet Oral Daily  . mupirocin ointment  1 application Nasal BID  . pantoprazole  40 mg Oral Daily  . rosuvastatin  40 mg Oral q1800  . sodium chloride flush  10-40 mL Intracatheter Q12H  . sodium chloride flush  3 mL Intravenous Q12H  . sodium chloride flush  3 mL Intravenous Q12H  . spironolactone  12.5 mg Oral Daily  . traZODone  50 mg Oral QHS    Infusions: . sodium chloride 10 mL/hr at 11/14/20 0544  . amiodarone 30 mg/hr (11/14/20 0544)  . furosemide (LASIX) 200 mg in dextrose 5% 100 mL (2mg /mL) infusion 12 mg/hr (11/14/20 0544)  . heparin 750 Units/hr (11/14/20 0544)  . impella catheter heparin 50 unit/mL in dextrose 5%    . milrinone 0.375 mcg/kg/min (11/14/20 0544)  . nitroGLYCERIN Stopped (11/12/20 1041)  . norepinephrine (LEVOPHED) Adult infusion    . vasopressin      PRN Medications: sodium chloride, acetaminophen, albuterol, LORazepam, ondansetron (ZOFRAN) IV, oxyCODONE-acetaminophen, oxyCODONE-acetaminophen, sodium chloride flush, sodium chloride flush    Patient Profile   KALAI BACA a 59 y.o.malewith a history of systolic HF, multivessel CAD status post CABG in December 2020 (with Maze and LAA clipping)  at which point he required Impella support due to cardiogenic shock, paroxysmal atrial fibrillation. type 2 diabetes mellitus, COPD, and hypertension.   Assessment/Plan   1. Acute on chronic systolic CHF with cardiogenic shock: Echo 10/21 with EF 20-25%, mildly decreased RV function (persistently low). LHC/RHC this admission with patent grafts, low output. Suspect mixed ischemic/nonischemic cardiomyopathy (prior heavy ETOH and drugs as well as CAD).  No ETOH, drugs, smoking for about 1 year since CABG in 12/20. Now s/p placement of Impella 5.5.  Hematoma at site, CT 12/5 no active bleeding and hgb relatively stable.  Stable LDH.  Excellent diuresis yesterday, CVP down to 1.  CO-OX today 54%.  CI 2.6. - Continue impella P8.  Good position by echo yesterday.   - Continue milrinone 0.375.  - CVP low. Stop IV lasix.  - Continue digoxin 0.125 daily and spironolactone 12.5 daily.  - LVAD workup ongoing, I think he will be candidate.  LVAD coordinators and surgeon have seen.  Not sure he would make it to transplant with poor diabetes control and social issues. Needs dental consult.   2. CAD: S/p CABG 12/20.  LHC this admission with patent grafts, no target for intervention.  - Continue ASA and statin.  3. Atrial fibrillation: Paroxysmal.  S/p Maze and LA appendage clip with CABG in 12/20.  In NSR with PACs currently.  - Continue amiodarone gtt.  - Continue heparin gtt.  4. Type 2 diabetes: A1c 10.4, poor control.  Continue insulin.  5. Prior ETOH, drugs: None x 1 year.  6. Thrombocytopenia: Mildly decreased.  Likely due to low level hemolysis.  Follow closely.  7. Hyponatremia: Na 126  restrict free water.  8. COPD: PFTs showed FEV1 1.79 (limited study due to LVAD).   Length of Stay: 6  Amy Clegg, NP  11/14/2020, 7:12 AM  Advanced Heart Failure Team Pager [phone] (M-F; 7a - 4p)  Please contact [facility] for night-coverage after hours (4p -7a ) and weekends on [email or web address]  Patient seen with  NP, agree with the above note.   Excellent diuresis yesterday with IV Lasix gtt.  Good cardiac output by thermodilution.  CVP  down to 1. Hgb stable at 11.5.   General: NAD Neck: No JVD, no thyromegaly or thyroid nodule.  Lungs: Clear to auscultation bilaterally with normal respiratory effort. CV: Nondisplaced PMI.  Heart regular S1/S2, no S3/S4, Impella sounds.  No peripheral edema.   Abdomen: Soft, nontender, no hepatosplenomegaly, no distention.  Skin: Intact without lesions or rashes.  Neurologic: Alert and oriented x 3.  Psych: Normal affect. Extremities: No clubbing or cyanosis.  HEENT: Normal.   Cardiac output adequate on current support, continue Impella and milrinone.  We have discussed patient at [facility], plan LVAD placement on Friday.  Pre-LVAD workup continues.    Volume status now low.  Will hold Lasix.   Platelets mildly low but stable.   Hyponatremia, restrict free water.  With low CVP will not give tolvaptan.   Mobilize, up to chair.   CRITICAL CARE Performed by: Marca Ancona  Total critical care time: 40 minutes  Critical care time was exclusive of separately billable procedures and treating other patients.  Critical care was necessary to treat or prevent imminent or life-threatening deterioration.  Critical care was time spent personally by me on the following activities: development of treatment plan with patient and/or surrogate as well as nursing, discussions with consultants, evaluation of patient's response to treatment, examination of patient, obtaining history from patient or surrogate, ordering and performing treatments and interventions, ordering and review of laboratory studies, ordering and review of radiographic studies, pulse oximetry and re-evaluation of patient's condition.  Marca Ancona 11/14/2020 7:42 AM

## 2020-11-14 NOTE — Progress Notes (Signed)
This chaplain responded to PMT consult for spiritual care.  The chaplain understands the Pt. requested assistance with an Advance Directive and intermittent spiritual care.  The chaplain checked in with the Pt. RN-Tanya before the visit.  The chaplain understands the Pt. is with the social worker at the time of the chaplain's visit.  The chaplain will F/U at the best time for the Pt. and chaplain.

## 2020-11-14 NOTE — Progress Notes (Signed)
ANTICOAGULATION CONSULT NOTE   Pharmacy Consult for Heparin Indication: atrial fibrillation, impella  No Known Allergies  Patient Measurements: Height: 5\' 10"  (177.8 cm) Weight: 79 kg (174 lb 2.6 oz) IBW/kg (Calculated) : 73 Heparin Dosing Weight: 82kg  Vital Signs: Temp: 98.2 F (36.8 C) (12/07 1144) Temp Source: Core (Comment) (12/07 0800) BP: 114/86 (12/07 1100) Pulse Rate: 82 (12/07 1144)  Labs: Recent Labs    11/12/20 0750 11/12/20 1252 11/12/20 1851 11/12/20 1851 11/13/20 0454 11/13/20 0454 11/13/20 1332 11/13/20 1536 11/14/20 0343  HGB  --    < > 10.0*   < > 10.4*   < >  --  11.9* 11.5*  HCT  --    < > 29.0*   < > 29.8*  --   --  35.0* 34.5*  PLT  --    < > 120*  --  126*  --   --   --  129*  HEPARINUNFRC 0.31  --   --   --  0.14*  --   --   --  <0.10*  CREATININE  --    < >  --    < > 0.85  --  0.96  --  0.95   < > = values in this interval not displayed.    Estimated Creatinine Clearance: 86.4 mL/min (by C-G formula based on SCr of 0.95 mg/dL).   Medical History: Past Medical History:  Diagnosis Date  . Arthritis   . CAD (coronary artery disease)    a. s/p CABG in 11/2019 with LIMA-LAD, SVG-OM1, SVG-PDA and SVG-D1  . CHF (congestive heart failure) (HCC)    a. EF < 20% by echo in 11/2019  . COPD (chronic obstructive pulmonary disease) (HCC)   . Essential hypertension   . PAF (paroxysmal atrial fibrillation) (HCC)   . Type 2 diabetes mellitus (HCC)      Assessment: 68 yoM admitted with CP and cardiogenic shock. Pt has hx AFib with no AC PTA, pharmacy to start IV heparin.   Impella 5.5 placed 12/3.  Heparin level therapeutic 12/5 but then bleeding at insertion site  so heparin drip decreased with plan to keep HL < 0.3 IV heparin systemically at 750units/hr with no titration for now per MD+ heparin in purge solution at 49ml/hr  600 units/hr. H/h and pltc stable LDH in low 200s HL < 0.1, purge pressure stable at 400s  Goal of Therapy:  Heparin  level < 0.3 12/5 pm Monitor platelets by anticoagulation protocol: Yes   Plan:  No changes to heparin systemic drip 750 uts/hr Will check HL with am labs   18m Pharm.D. CPP, BCPS Clinical Pharmacist [phone] 11/14/2020 11:59 AM    [facility] pharmacy phone numbers are listed on [email or web address]

## 2020-11-15 ENCOUNTER — Encounter (HOSPITAL_COMMUNITY): Payer: Self-pay | Admitting: Cardiology

## 2020-11-15 ENCOUNTER — Inpatient Hospital Stay (HOSPITAL_COMMUNITY): Payer: 59

## 2020-11-15 ENCOUNTER — Telehealth (HOSPITAL_COMMUNITY): Payer: Self-pay | Admitting: Licensed Clinical Social Worker

## 2020-11-15 ENCOUNTER — Inpatient Hospital Stay: Payer: Self-pay

## 2020-11-15 DIAGNOSIS — I5021 Acute systolic (congestive) heart failure: Secondary | ICD-10-CM | POA: Diagnosis not present

## 2020-11-15 LAB — BASIC METABOLIC PANEL
Anion gap: 10 (ref 5–15)
BUN: 20 mg/dL (ref 6–20)
CO2: 29 mmol/L (ref 22–32)
Calcium: 8.7 mg/dL — ABNORMAL LOW (ref 8.9–10.3)
Chloride: 87 mmol/L — ABNORMAL LOW (ref 98–111)
Creatinine, Ser: 0.75 mg/dL (ref 0.61–1.24)
GFR, Estimated: >60 mL/min (ref >60)
Glucose, Bld: 299 mg/dL — ABNORMAL HIGH (ref 70–99)
Potassium: 3.9 mmol/L (ref 3.5–5.1)
Sodium: 126 mmol/L — ABNORMAL LOW (ref 135–145)

## 2020-11-15 LAB — GLUCOSE, CAPILLARY
Glucose-Capillary: 272 mg/dL — ABNORMAL HIGH (ref 70–99)
Glucose-Capillary: 329 mg/dL — ABNORMAL HIGH (ref 70–99)
Glucose-Capillary: 231 mg/dL — ABNORMAL HIGH (ref 70–99)
Glucose-Capillary: 231 mg/dL — ABNORMAL HIGH (ref 70–99)

## 2020-11-15 LAB — CBC
HCT: 33 % — ABNORMAL LOW (ref 39.0–52.0)
Hemoglobin: 11.5 g/dL — ABNORMAL LOW (ref 13.0–17.0)
MCH: 29.1 pg (ref 26.0–34.0)
MCHC: 34.8 g/dL (ref 30.0–36.0)
MCV: 83.5 fL (ref 80.0–100.0)
Platelets: 141 10*3/uL — ABNORMAL LOW (ref 150–400)
RBC: 3.95 MIL/uL — ABNORMAL LOW (ref 4.22–5.81)
RDW: 14.7 % (ref 11.5–15.5)
WBC: 9.7 10*3/uL (ref 4.0–10.5)
nRBC: 0 % (ref 0.0–0.2)

## 2020-11-15 LAB — LACTATE DEHYDROGENASE: LDH: 260 U/L — ABNORMAL HIGH (ref 98–192)

## 2020-11-15 LAB — DIGOXIN LEVEL: Digoxin Level: 0.4 ng/mL — ABNORMAL LOW (ref 0.8–2.0)

## 2020-11-15 LAB — LUPUS ANTICOAGULANT PANEL
DRVVT: 39.2 s (ref 0.0–47.0)
PTT Lupus Anticoagulant: 59.2 s — ABNORMAL HIGH (ref 0.0–51.9)

## 2020-11-15 LAB — HEPARIN LEVEL (UNFRACTIONATED): Heparin Unfractionated: <0.1 IU/mL — ABNORMAL LOW (ref 0.30–0.70)

## 2020-11-15 LAB — ECHOCARDIOGRAM LIMITED
Height: 70 in
Weight: 2793.67 oz

## 2020-11-15 LAB — FACTOR 5 LEIDEN

## 2020-11-15 LAB — COOXEMETRY PANEL
Carboxyhemoglobin: 1.9 % — ABNORMAL HIGH (ref 0.5–1.5)
Methemoglobin: 1 % (ref 0.0–1.5)
O2 Saturation: 65.8 %
Total hemoglobin: 12.6 g/dL (ref 12.0–16.0)

## 2020-11-15 LAB — HEXAGONAL PHASE PHOSPHOLIPID: Hexagonal Phase Phospholipid: 2 s (ref 0–11)

## 2020-11-15 LAB — PTT-LA MIX: PTT-LA Mix: 51.2 s — ABNORMAL HIGH (ref 0.0–48.9)

## 2020-11-15 MED ORDER — INSULIN ASPART 100 UNIT/ML ~~LOC~~ SOLN
6.0000 [IU] | Freq: Three times a day (TID) | SUBCUTANEOUS | Status: DC
  Administered 2020-11-15 – 2020-11-16 (×2): 6 [IU] via SUBCUTANEOUS

## 2020-11-15 MED ORDER — SODIUM CHLORIDE 0.9% FLUSH
10.0000 mL | Freq: Two times a day (BID) | INTRAVENOUS | Status: DC
  Administered 2020-11-15 – 2020-11-19 (×4): 10 mL
  Administered 2020-11-21: 10:00:00 20 mL
  Administered 2020-11-24: 10:00:00 10 mL

## 2020-11-15 MED ORDER — SORBITOL 70 % SOLN
30.0000 mL | Freq: Once | Status: AC
  Administered 2020-11-15: 30 mL via ORAL
  Filled 2020-11-15: qty 30

## 2020-11-15 MED ORDER — MORPHINE SULFATE (PF) 2 MG/ML IV SOLN
1.0000 mg | INTRAVENOUS | Status: DC | PRN
  Administered 2020-11-17: 2 mg via INTRAVENOUS
  Filled 2020-11-15: qty 1

## 2020-11-15 MED ORDER — TOLVAPTAN 15 MG PO TABS
15.0000 mg | ORAL_TABLET | Freq: Once | ORAL | Status: AC
  Administered 2020-11-15: 15 mg via ORAL
  Filled 2020-11-15: qty 1

## 2020-11-15 MED ORDER — SODIUM CHLORIDE 0.9% FLUSH: 10.0000 mL | INTRAVENOUS | Status: DC | PRN

## 2020-11-15 MED ORDER — DOCUSATE SODIUM 100 MG PO CAPS
100.0000 mg | ORAL_CAPSULE | Freq: Every day | ORAL | Status: DC
  Administered 2020-11-15 – 2020-11-16 (×2): 100 mg via ORAL
  Filled 2020-11-15 (×2): qty 1

## 2020-11-15 NOTE — Progress Notes (Signed)
LVAD Initial Psychosocial Screening  Date/Time Initiated:  11-14-2020  11:25am Referral Source:  Carlton Adam, VAD Coordinator Referral Reason: LVAD Implantation  Source of Information:  Pt, wife and Chart review  Demographics Name:  Kevin Underwood Address:  [address] [place] [address] Home phone: [phone]    Cell: [phone] Marital Status:  Married  Faith:  [employer] Primary Language:  English SS:  [id number]   DOB:  [birth year]  Medical & Follow-up Adherence to Medical regimen/INR checks: non-compliant  Medication adherence: compliant  Physician/Clinic Appointment Attendance: non-compliant Comments: Patient admits to missing appointments. "I have messed up but I am going to be compliant now"   Advance Directives: Do you have a Living Will or Medical POA? No  Would you like to complete a Living Will and Medical POA prior to surgery?  Yes Do you have Goals of Care? Yes  Have you had a consult with the Palliative Care Team at [facility]? Yes  Psychological Health Appearance:  In hospital gown Mental Status:  Alert, oriented Eye Contact:  Good Thought Content:  Coherent Speech:  Logical/coherent Mood:  Appropriate and Positive  Affect:  Appropriate to circumstance Insight:  Fair Judgement: Unimpaired Interaction Style:  Engaged  Family/Social Information Who lives in your home? Name:   Relationship:   Anakin Nolette Wife Hanford Tsung  Son (moving out)  Other family members/support persons in your life? Name:   Relationship:   Rozanna Boer  Mother  [phone] Zoe Honn Sister  [phone]  Caregiving Needs Who is the primary caregiver? Eliott Nine Health status:  good Do you drive?   yes Do you work?  no Physical Limitations:  no Do you have other care giving responsibilities?  no Contact number: [phone]  Who is the secondary caregiver? Reynaldo Minium Health status:  good Do you drive?  yes Do you work?  No (disability) Physical  Limitations: no  Do you have other care giving responsibilities?  no Contact number: [phone]  Home Environment/Personal Care Do you have reliable phone service? Yes  If so, what is the number?  [phone] Do you own or rent your home? Rent (section 8) Current mortgage/rent: $250 Number of steps into the home? 13 steps How many levels in the home? 2nd floor apartment Assistive devices in the home? no Electrical needs for LVAD (3 prong outlets)? yes Second hand smoke exposure in the home? Wife smokes outside Travel distance from [place]? 40 minutes   Community Are you active with community agencies/resources/homecare? No Agency Name: n/a Are you active in a church, synagogue, mosque or other faith based community? No Faith based institutions name: n/a What other sources do you have for spiritual support? self Are you active in any clubs or social organizations? none What do you do for fun?  Hobbies?  Interests? Fix up old vacuum cleaners  Education/Work Information What is the last grade of school you completed? High School Preferred method of learning?  Hands on Do you have any problems with reading or writing?  No Are you currently employed?  No  When were you last employed? 1 yr ago  Name of employer? 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 25 yrs If you are not working, do you plan to return to work after VAD surgery? Pending If yes, what type of employment do you hope to find? Are you interested in job training or learning new skills?  No Did you serve in the military? Yes  If so, what branch? [employer] 8 years  Financial Information What is your source of income? Disability $1067 Do you have difficulty meeting your monthly expenses? No Can you budget for the monthly cost for dressing supplies post procedure? Yes  Primary Health insurance:  Medicare (Dual [employer]) Secondary Insurance: Medicaid Prescription plan: D/Caid What are your prescription co-pays? $0-$3 Do you use mail order for your prescriptions?  No Have you ever had to refuse medication due to cost?  No Have you applied for Medicaid?  n/a Have you applied for Social Security Disability (SSI)  n/a  Medical Information Briefly describe why you are here for evaluation: "Because of the drugs I did back in the day messed up my heart" Been clean for 4 yrs now Do you have a PCP or other medical provider? Kirstie Peri, MD Are you able to complete your ADL's? yes Do you have a history of trauma, physical, emotional, or sexual abuse? no Do you have any family history of heart problems? none Do you smoke now or past usage? past usage    Quit date: 1 year ago smoked 1 pack a day for years Do you drink alcohol now or past usage? past usage    Quit date: 1 year ago drank a 6 pack a day   Are you currently using illegal drugs or misuse of medication or past usage? past usage Cocaine was drug of choice  Have you ever been treated for substance abuse? Yes      If yes, where and when did you receive treatment? [facility] 10 day program attended (2017) 4 years ago and has been clean since then.  Mental Health History How have you been feeling in the past year? Not real good Have you ever had any problems with depression, anxiety or other mental health issues? no Do you see a counselor, psychiatrist or therapist?  no If you are currently experiencing problems are you interested in talking with a professional? No Have you or are you taking medications for anxiety/depression or any mental health concerns?  No  Current Medications: What are your coping strategies under stressful situations? Work on Physiological scientist Are there any other stressors in your life? 25yo son on drugs and another son in prison. Have you had any past or current thoughts of suicide? no How many hours do you sleep at night?  3-4 hours How is your appetite? good Would you be interested in attending the LVAD support group? maybe  PHQ2 Depression Scale: 3 PHQ9 Depression scale (if positive PHQ2 screen):      Legal Do you currently have any legal issues/problems?  Patient has upcoming court dates regarding revoked drivers license.  Have you had any legal issues/problems in the past?  Yes, regarding drivers license Do you have a Durable POA?  no     Plan for VAD Implementation Do you know and understand what happens during the VAD surgery? Patient Verbalizes Understanding  of surgery and able to describe details What do you know about the risks and side effect associated with VAD surgery? Patient Verbalizes Understanding  of risks (infection, stroke and death) Explain what will happen right after surgery: Patient Verbalizes Understanding  of OR to ICU and will be intubated What is your plan for transportation for the first 8 weeks post-surgery? (Patients are not recommended to drive post-surgery for 8 weeks)  Driver:   Carolanne Grumbling wife Do you  have airbags in your vehicle?  There is a risk of discharging the device if the airbag were to deploy. What do you know about your diet post-surgery? Patient Verbalizes Understanding  of Heart healthy How do you plan to monitor your medications, current and future?  Wife fills pillbox  How do you plan to complete ADL's post-surgery?  Ask for assistance Will it be difficult to ask for help from your caregivers?  no  Please explain what you hope will be improved about your life as a result of receiving the LVAD? "everything and can be more active again" Please tell me your biggest concern or fear about living with the LVAD?  None at this time How do you cope with your concerns and fears?  Fix stuff Please explain your understanding of how their body will change?  Patient verbalizes Are you worried about these changes? Denies any concerns Do you see any barriers to your  surgery or follow-up? Patient denies any concerns  Understanding of LVAD Patient states understanding of the following: Surgical procedures and risks, Electrical need for LVAD (3 prong outlets), Safety precautions with LVAD (water, etc.), LVAD daily self-care (dressing changes, computer check, extra supplies), Outpatient follow up (LVAD clinic appts, monitoring blood thinners) and Need for Emergency Planning  Discussed and Reviewed with Patient and Caregiver  Patient's current level of motivation to prepare for LVAD: Yes, just waiting on all of you. Patient's present Level of Consent for LVAD: Ready     Education provided to patient/family/caregiver:   Caregiver role and responsibiltiy, Financial planning for LVAD, Role of Clinical Social Worker and Signs of Depression and Anxiety     Caregiver questions Please explain what you hope will be improved about your life and loved one's life as a result of receiving the LVAD?  Enjoy the rest of his life What is your biggest concern or fear about caregiving with an LVAD patient?  None feel like I understand all of it What is your plan for availability to provide care 24/7 x2 weeks post op and dressing changes ongoing?  Will be home and with him all the time Who is the relief/backup caregiver and what is their availability?  available Preferred method of learning? Written, Verbal and Hands on  Do you drive? yes How do you handle stressful situations?  Pretty good  Do you think you can do this? yes Is there anything that concerns about caregiving?  no Do you provide caregiving to anyone else? no   Caregiver's current level of motivation to prepare for LVAD: motivated Caregiver's present level of consent for LVAD: yes  Clinical Interventions Needed:    Mr Miler is a 59yo male who is married for 40 years. He has 2 sons who cause added stress to patient; one who is actively using drugs and the other is in prison. 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 He admits to being non compliant with his medical regimen prior but states he will follow instructions this time as he realizes the severity of his medical situation. He does not have a living will although would like to complete prior to surgery. He states that his wife and sister will be the caregivers and his wife is able to be home 24/7 for the first 2 weeks. Patient did note some family conflict between wife and mother/sister but states "they will be fine as caregivers". He lives in a 2 bedroom apartment with section  8 certificate. His 25yo son is currently staying with patient and wife and needs to leave asap as he is not on the lease and jeopardizing their section 8 certificate. Patient assures CSW that son will move out today. Patient is not involved with any community organizations or faith based communities. He states that he likes to fix old vacuums as a hobby and stress reducer.  He completed High school and prefers to learn hands on method. He served in the [employer] for 8 years. He has a disability income with Dual Complete insurance. He denies any financial concerns at this time. He admits to his current health as a result of his past drug use. He states he used cocaine for many years and attended a drug treatment program in [place] 4 years ago and has been clean since. He admits to tobacco and alcohol use and quit both 1 year ago. He denies any trauma or abuse.  He states he has not been feeling good over the past year and unable to wlak to his mailbox without becoming SOB. He denies any depression or mental health concerns. He states his sons add additional stressors to his life at the moment. 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 He admits to legal concerns as he has a pending court date for revoked drivers license.  He states his goal for LVAD is to "improve everything and be more active" and  denies any concerns or fears at this time.   Clinical Impressions/Recommendations:   CSW will monitor signs and symptoms of depression and assist with adjustment to life with an LVAD.  CSW will provide supportive intervention and will monitor family conflict if becomes a stressor for patient.  CSW will refer patient for Advanced Directive if not completed prior to surgery if still wishing to complete.  CSW will monitor and assess [facility] concerns ongoing. CSW encouraged attendance with the LVAD Support Group to assist further with adjustment and post implant peer support.   Patient appears to be a candidate for LVAD implantation and will be followed by CSW for supportive intervention and needs as they arise.  Shane Crutch, CCSW-MCS [phone]

## 2020-11-15 NOTE — Progress Notes (Signed)
ANTICOAGULATION CONSULT NOTE   Pharmacy Consult for Heparin Indication: atrial fibrillation, impella  No Known Allergies  Patient Measurements: Height: 5\' 10"  (177.8 cm) Weight: 79.2 kg (174 lb 9.7 oz) IBW/kg (Calculated) : 73 Heparin Dosing Weight: 82kg  Vital Signs: Temp: 98.4 F (36.9 C) (12/08 1100) Temp Source: Oral (12/08 1100) BP: 115/83 (12/08 1300) Pulse Rate: 78 (12/08 1300)  Labs: Recent Labs    11/13/20 0454 11/13/20 0454 11/13/20 1332 11/13/20 1536 11/13/20 1536 11/14/20 0343 11/15/20 0515  HGB 10.4*   < >  --  11.9*   < > 11.5* 11.5*  HCT 29.8*   < >  --  35.0*  --  34.5* 33.0*  PLT 126*  --   --   --   --  129* 141*  HEPARINUNFRC 0.14*  --   --   --   --  <0.10* <0.10*  CREATININE 0.85   < > 0.96  --   --  0.95 0.75   < > = values in this interval not displayed.    Estimated Creatinine Clearance: 102.7 mL/min (by C-G formula based on SCr of 0.75 mg/dL).   Medical History: Past Medical History:  Diagnosis Date  . Arthritis   . CAD (coronary artery disease)    a. s/p CABG in 11/2019 with LIMA-LAD, SVG-OM1, SVG-PDA and SVG-D1  . CHF (congestive heart failure) (HCC)    a. EF < 20% by echo in 11/2019  . COPD (chronic obstructive pulmonary disease) (HCC)   . Essential hypertension   . PAF (paroxysmal atrial fibrillation) (HCC)   . Type 2 diabetes mellitus (HCC)      Assessment: 15 yoM admitted with CP and cardiogenic shock. Pt has hx AFib with no AC PTA, pharmacy to start IV heparin.   Impella 5.5 placed 12/3.  Heparin level therapeutic 12/5 but then bleeding at insertion site  so heparin drip decreased with plan to keep HL < 0.3  IV heparin systemically at 750units/hr with no titration for now per MD+ heparin in purge solution at 12.2 ml/hr = 610 units/hr. H/h and pltc stable LDH in 200s HL < 0.1, purge pressure stable at 400s  Goal of Therapy:  Heparin level < 0.3 12/5 pm Monitor platelets by anticoagulation protocol: Yes   Plan:  No  changes to heparin systemic drip 750 uts/hr Will check next heparin level with AM labs. Daily CBC.  14/5, Reece Leader, BCCP Clinical Pharmacist  11/15/2020 1:18 PM   [facility] pharmacy phone numbers are listed on [email or web address]   [facility] pharmacy phone numbers are listed on [email or web address]

## 2020-11-15 NOTE — Progress Notes (Signed)
Kevin Underwood has been discussed with the VAD Medical Review board on 11/13/20 The team feels as if the patient is a good candidate for Destination LVAD therapy. The patient meets criteria for a LVAD implant as listed below:  1) NYHA Class: IV documented on 11/28/20.  2) Has a left ventricular ejection fraction (LVEF) < 25%   *EF<20% by echo on 11/10/20  3) Must meet one of the following:  .  Is inotrope dependent   *On inotrope Milrinone started 11/09/20. AND  . Has a Cardiac Index (CI) < 2.2  L/min/m2 while not on inotropes:   *CI:  11/09/20 prior to initiating Milrinone:     Fick CI = 1.5 L/min   Thermal CI = 1.35 L/min     4) Must meet one of the following:   ______ Is on optimal medical management (OMM), based on current heart failure practice guidelines for at least 7 of the last 60 days and are failing to respond   ___X___ Has advanced heart failure for at least 14 days and are dependent on an intra-aortic balloon pump (IABP) or similar temporary mechanical circulatory support for at least 7 days      ____ IABP inserted (date) ____               __X__ Impella inserted 11/10/20  5)  Social work and palliative care evaluations demonstrate appropriate support system in place for discharge to home with a VAD and that end of life discussions have taken place. Both services have expressed no concern regarding patient's candidacy.         *Social work consult:  11/10/20, 11/14/20 - Raquel Sarna, LCSW        *Palliative Care Consult: 11/11/20 - Kevin Dow, NP  6)  Primary caretaker identified that can be taught along with the patient how to manage        the VAD equipment.        *Name:  Kevin Underwood (wife)  7)  Deemed appropriate by our financial coordinator: 11/14/20 - Cecilio Asper        Prior approval: obtained 11/14/20 from [facility]; Ref # [id number]  4)  VAD Coordinator, Emerson Monte has met with patient and caregiver, shown them the VAD equipment and  discussed with the patient and caregiver about lifestyle changes necessary for success on mechanical circulatory device.        *Met with patient and wife on 11/10/20:        *Consent for VAD Evaluation/Caregiver Agreement/HIPPA Release/Photo Release signed on 11/10/20   9)  Six Minute Walk: 36 ft   10)  Intermacs profile: 2  INTERMACS 1: Critical cardiogenic shock describes a patient who is "crashing and burning", in which a patient has life-threatening hypotension and rapidly escalating inotropic pressor support, with critical organ hypoperfusion often confirmed by worsening acidosis and lactate levels.  INTERMACS 2: Progressive decline describes a patient who has been demonstrated "dependent" on inotropic support but nonetheless shows signs of continuing deterioration in nutrition, renal function, fluid retention, or other major status indicator. Patient profile 2 can also describe a patient with refractory volume overload, perhaps with evidence of impaired perfusion, in whom inotropic infusions cannot be maintained due to tachyarrhythmias, clinical ischemia, or other intolerance.  INTERMACS 3: Stable but inotrope dependent describes a patient who is clinically stable on mild-moderate doses of intravenous inotropes (or has a temporary circulatory support device) after repeated documentation of failure to wean without symptomatic hypotension, worsening symptoms, or  progressive organ dysfunction (usually renal). It is critical to monitor nutrition, renal function, fluid balance, and overall status carefully in order to distinguish between a   patient who is truly stable at Patient Profile 3 and a patient who has unappreciated decline rendering them Patient Profile 2. This patient may be either at home or in the hospital.      INTERMACS 4: Resting symptoms describes a patient who is at home on oral therapy but frequently has symptoms of congestion at rest or with activities of daily living  (ADL). He or she may have orthopnea, shortness of breath during ADL such as dressing or bathing, gastrointestinal symptoms (abdominal discomfort, nausea, poor appetite), disabling ascites or severe lower extremity edema. This patient should be carefully considered for more intensive management and surveillance programs, which may in some cases, reveal poor compliance that would compromise outcomes with any therapy.   .   INTERMACS 5: Exertion Intolerant describes a patient who is comfortable at rest but unable to engage in any activity, living predominantly within the house or housebound. This patient has no congestive symptoms, but may have chronically elevated volume status, frequently with renal dysfunction, and may be characterized as exercise intolerant.      INTERMACS 6: Exertion Limited also describes a patient who is comfortable at rest without evidence of fluid overload, but who is able to do some mild activity. Activities of daily living are comfortable and minor activities outside the home such as visiting friends or going to a restaurant can be performed, but fatigue results within a few minutes of any meaningful physical exertion. This patient has occasional episodes of worsening symptoms and is likely to have had a hospitalization for heart failure within the past year.   INTERMACS 7: Advanced NYHA Class 3 describes a patient who is clinically stable with a reasonable level of comfortable activity, despite history of previous decompensation that is not recent. This patient is usually able to walk more than a block. Any decompensation requiring intravenous diuretics or hospitalization within the previous month should make this person a Patient Profile 6 or lower.

## 2020-11-15 NOTE — Progress Notes (Signed)
Inpatient Diabetes Program Recommendations  AACE/ADA: New Consensus Statement on Inpatient Glycemic Control (2015)  Target Ranges:  Prepandial:   less than 140 mg/dL      Peak postprandial:   less than 180 mg/dL (1-2 hours)      Critically ill patients:  140 - 180 mg/dL   Lab Results  Component Value Date   GLUCAP 329 (H) 11/15/2020   HGBA1C 10.4 (H) 11/10/2020    Review of Glycemic Control Results for Kevin Underwood, Kevin Underwood (MRN [id number]) as of 11/15/2020 11:56  Ref. Range 11/14/2020 11:25 11/14/2020 15:45 11/14/2020 21:29 11/15/2020 06:26 11/15/2020 11:24  Glucose-Capillary Latest Ref Range: 70 - 99 mg/dL 977 (H) 414 (H) 239 (H) 272 (H) 329 (H)   Inpatient Diabetes Program Recommendations:   Noted patient for surgery Friday. Consider: -Increase Novolog meal coverage to 6 units tid if eats 50%  Thank you, Billy Fischer. Jasmeen Fritsch, RN, MSN, CDE  Diabetes Coordinator Inpatient Glycemic Control Team Team Pager [phone] (8am-5pm) 11/15/2020 11:59 AM

## 2020-11-15 NOTE — Progress Notes (Signed)
  Echocardiogram 2D Echocardiogram limited with color has been performed.  Leta Jungling M 11/15/2020, 12:42 PM

## 2020-11-15 NOTE — Progress Notes (Addendum)
This chaplain is present to facilitate the notarization of the the Pt. AD. The chaplain reviewed the AD document with the Pt. wife-Tracy.    The chaplain understands the Pt. will be participating in medical procedure for about an hour. The chaplain will coordinate return with notary and witnesses.   *[phone] This chaplain facilitated the notary and witnesses to notarize the Pt. Advance Directive: HCPOA and Living Will.    The Pt. named Rutilio Yellowhair as his healthcare agent and Marijean Heath as the person if Kevin Underwood is unable or unwilling to serve as healthcare agent.  The chaplain gave the original AD to the Pt. along with two copies.  A copy of the AD is scanned into EMR.  This chaplain is available for F/U spiritual care as needed.

## 2020-11-15 NOTE — Progress Notes (Signed)
VAD Coordinator met with patient and wife to continue VAD education and answer any questions. Both say they have read the material and feel they understand the upcoming surgery and recovery.   Incentive spirometer given to patient with demonstration on how to use. Pt inhaled 4 L several times without difficulty. Encouraged him to use several times per hour daily in preparation for surgery scheduled Friday of this week. Pt verbalized agreement to same.   Pt met with VAD patient this past Monday and reports he had his questions answered and enjoyed meeting the patient.   Patient completed pre-implant Intermacs questionnaires including EQ-5D-3L QOL, KCCQ-12, and Neurocognitive Trailmaking.   Patient and wife will discuss where wife will be Friday during surgery (hospital or home) so we can keep her updated. No further questions from either.  Patient and wife have housing plan post op confirmed. They will be moving in with his mother for short term and seek a new apartment later.   Zada Girt RN, VAD Coordinator [phone] 24/7 VAD Pager: [phone]

## 2020-11-15 NOTE — Telephone Encounter (Signed)
Entered in error

## 2020-11-15 NOTE — Progress Notes (Addendum)
Patient ID: Kevin Underwood, male   DOB: 1961/04/04, 59 y.o.   MRN: [id number]     Advanced Heart Failure Rounding Note  PCP-Cardiologist: Nona Dell, MD   Subjective:    - 12/2 Had cath as noted below with intact revascularization. CO-OX 39%, started on milrinone.  - 12/3 Impella 5.5 placed -12/6 Impella speed increased to 8. Started on lasix drip and given metolazone. Brisk diuresis noted. Negative 5 liters.   Remain on milrinone 0.375 mcg + amio drip. 66%    Hematoma at Impella site on CTA 12/5 without active bleeding. Hgb stable 11.5   Complaining of back pain. Denies SOB.   Swan PAP: (21-58)/(3-24) 39/17 CVP:  [0 mmHg-64 mmHg] 5 mmHg PCWP:  [11 mmHg] 11 mmHg CO:  [4.5 L/min-6.9 L/min] 6.9 L/min CI:  [2.2 L/min/m2-3.4 L/min/m2] 3.4 L/min/m2   Impella 5.5 P-8 Flow 7 CPO 1.3  LDH 190 => 214=>239 =>260   RHC/LHC 11/09/20  RA 20  PCWP 30-35 PA Sat 40% CO 3 CI 1.5.  1. Severe native coronary artery disease, similar to prior catheterization in 11/2019.  2. Widely patent LIMA-LAD, SVG-D1, and sequential SVG-OM3-rPDA. 3. Severely elevated left and right heart filling pressures. 4. Severely reduced cardiac output/index. 5. Successful placement of leave-in PA catheter via the right internal jugular vein.   Objective:   Weight Range: 79.2 kg Body mass index is 25.05 kg/m.   Vital Signs:   Temp:  [97 F (36.1 C)-99.1 F (37.3 C)] 97 F (36.1 C) (12/08 0400) Pulse Rate:  [70-96] 76 (12/08 0400) Resp:  [11-26] 18 (12/08 0400) BP: (100-141)/(54-99) 119/93 (12/08 0400) SpO2:  [92 %-100 %] 96 % (12/08 0400) Arterial Line BP: (99-145)/(59-89) 120/75 (12/08 0400) Weight:  [79.2 kg] 79.2 kg (12/08 0630) Last BM Date: 11/11/20  Weight change: Filed Weights   11/13/20 0500 11/14/20 0545 11/15/20 0630  Weight: 80.5 kg 79 kg 79.2 kg    Intake/Output:   Intake/Output Summary (Last 24 hours) at 11/15/2020 0713 Last data filed at 11/15/2020 0600 Gross per 24 hour   Intake 2588.93 ml  Output 1525 ml  Net 1063.93 ml      Physical Exam  CVP 5-6  General:  No resp difficulty HEENT: normal Neck: supple. no JVD. Carotids 2+ bilat; no bruits. No lymphadenopathy or thryomegaly appreciated. Cor: PMI nondisplaced. Regular rate & rhythm. No rubs, gallops or murmurs. L axillary Impella.  Lungs: clear Abdomen: soft, nontender, nondistended. No hepatosplenomegaly. No bruits or masses. Good bowel sounds. Extremities: no cyanosis, clubbing, rash, edema. RUE A line  Neuro: alert & orientedx3, cranial nerves grossly intact. moves all 4 extremities w/o difficulty. Affect pleasant   Telemetry   NSR 70s    Labs    CBC Recent Labs    11/14/20 0343 11/15/20 0515  WBC 10.6* 9.7  HGB 11.5* 11.5*  HCT 34.5* 33.0*  MCV 84.1 83.5  PLT 129* 141*   Basic Metabolic Panel Recent Labs    46/27/03 1332 11/13/20 1332 11/13/20 1536 11/14/20 0343  NA 127*   < > 129* 126*  K 3.9   < > 3.9 3.5  CL 88*  --   --  83*  CO2 28  --   --  33*  GLUCOSE 212*  --   --  260*  BUN 12  --   --  16  CREATININE 0.96  --   --  0.95  CALCIUM 8.9  --   --  9.2   < > = values  in this interval not displayed.   Liver Function Tests No results for input(s): AST, ALT, ALKPHOS, BILITOT, PROT, ALBUMIN in the last 72 hours. No results for input(s): LIPASE, AMYLASE in the last 72 hours. Cardiac Enzymes No results for input(s): CKTOTAL, CKMB, CKMBINDEX, TROPONINI in the last 72 hours.  BNP: BNP (last 3 results) Recent Labs    11/25/19 0500 09/22/20 0348 11/08/20 1344  BNP 596.1* 939.0* 910.0*    ProBNP (last 3 results) No results for input(s): PROBNP in the last 8760 hours.   D-Dimer No results for input(s): DDIMER in the last 72 hours. Hemoglobin A1C No results for input(s): HGBA1C in the last 72 hours. Fasting Lipid Panel No results for input(s): CHOL, HDL, LDLCALC, TRIG, CHOLHDL, LDLDIRECT in the last 72 hours. Thyroid Function Tests No results for input(s):  TSH, T4TOTAL, T3FREE, THYROIDAB in the last 72 hours.  Invalid input(s): FREET3  Other results:   Imaging    DG Orthopantogram  Result Date: 11/14/2020 CLINICAL DATA:  Preoperative evaluation for cardiovascular exam EXAM: ORTHOPANTOGRAM/PANORAMIC COMPARISON:  None FINDINGS: Multiple prior dental extractions. No periodontal lucency. No mandibular fracture, dislocation, or bone destruction. Visualized portions of maxilla unremarkable. Scattered dental caries noted. IMPRESSION: Prior dental extractions and scattered dental caries. No mandibular or visualized maxillary abnormality. Electronically Signed   By: Ulyses Southward M.D.   On: 11/14/2020 16:32     Medications:     Scheduled Medications: . aspirin EC  81 mg Oral Daily  . chlorhexidine  15 mL Mouth/Throat BID  . Chlorhexidine Gluconate Cloth  6 each Topical Daily  . digoxin  0.125 mg Oral Daily  . feeding supplement (GLUCERNA SHAKE)  237 mL Oral TID BM  . gabapentin  600 mg Oral TID  . insulin aspart  0-15 Units Subcutaneous TID WC  . insulin aspart  0-5 Units Subcutaneous QHS  . insulin aspart  4 Units Subcutaneous TID WC  . insulin glargine  20 Units Subcutaneous QHS  . magnesium oxide  400 mg Oral Daily  . mouth rinse  15 mL Mouth Rinse BID  . mometasone-formoterol  2 puff Inhalation BID  . multivitamin with minerals  1 tablet Oral Daily  . mupirocin ointment  1 application Nasal BID  . pantoprazole  40 mg Oral Daily  . rosuvastatin  40 mg Oral q1800  . sodium chloride flush  10-40 mL Intracatheter Q12H  . sodium chloride flush  3 mL Intravenous Q12H  . sodium chloride flush  3 mL Intravenous Q12H  . spironolactone  12.5 mg Oral Daily  . traZODone  50 mg Oral QHS    Infusions: . sodium chloride 10 mL/hr at 11/15/20 0600  . amiodarone 30 mg/hr (11/15/20 0600)  . heparin 750 Units/hr (11/15/20 0600)  . impella catheter heparin 50 unit/mL in dextrose 5%    . milrinone 0.375 mcg/kg/min (11/15/20 0600)  . nitroGLYCERIN  Stopped (11/12/20 1041)  . norepinephrine (LEVOPHED) Adult infusion    . vasopressin      PRN Medications: sodium chloride, acetaminophen, albuterol, LORazepam, ondansetron (ZOFRAN) IV, oxyCODONE-acetaminophen, oxyCODONE-acetaminophen, sodium chloride flush, sodium chloride flush    Patient Profile   Kevin Underwood a 59 y.o.malewith a history of systolic HF, 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 type 2 diabetes mellitus, COPD, and hypertension.   Assessment/Plan   1. Acute on chronic systolic CHF with cardiogenic shock: Echo 10/21 with  EF 20-25%, mildly decreased RV function (persistently low). LHC/RHC this admission with patent grafts, low output. Suspect mixed ischemic/nonischemic cardiomyopathy (prior heavy ETOH and drugs as well as CAD).  No ETOH, drugs, smoking for about 1 year since CABG in 12/20. NYHA class IV at admission.  Now s/p placement of Impella 5.5.  Hematoma at site, CT 12/5 no active bleeding and hgb relatively stable.  Stable LDH.   - Continue impella P8.  Volume status stable. CVP 6-7  - Continue milrinone 0.375.  - Continue digoxin 0.125 daily and spironolactone 12.5 daily.  - LVAD workup ongoing, LVAD coordinators and surgeon have seen.  Not sure he would make it to transplant with poor diabetes control and social issues.  - Dental consult completed. .   2. CAD: S/p CABG 12/20.  LHC this admission with patent grafts, no target for intervention.  - Continue ASA and statin.  3. Atrial fibrillation: Paroxysmal.  S/p Maze and LA appendage clip with CABG in 12/20.  In NSR with PACs currently.  - Continue amiodarone gtt.  - Continue heparin gtt.  4. Type 2 diabetes: A1c 10.4, poor control.  Continue insulin.  5. Prior ETOH, drugs: None x 1 year.  6. Thrombocytopenia: Mildly decreased.  Likely due to low level hemolysis.   Follow closely.  7. Hyponatremia: Na 126  restrict free water.  8. COPD: PFTs showed FEV1 1.79 (limited study due to LVAD).   Consult PT. Remove A line. Should be able to take out swan.   Length of Stay: 7  Amy Clegg, NP  11/15/2020, 7:13 AM  Advanced Heart Failure Team Pager [phone] (M-F; 7a - 4p)  Please contact [facility] for night-coverage after hours (4p -7a ) and weekends on [email or web address]  Patient seen with NP, agree with the above note.   Feels good this morning except for back pain.  Remains on milrinone 0.375 and P8 Impella, good cardiac output. CVP down to 5. Creatinine stable.   General: NAD Neck: No JVD, no thyromegaly or thyroid nodule.  Lungs: Clear to auscultation bilaterally with normal respiratory effort. CV: Nondisplaced PMI.  Heart regular S1/S2, no S3/S4, no murmur.  No peripheral edema.   Abdomen: Soft, nontender, no hepatosplenomegaly, no distention.  Skin: Intact without lesions or rashes.  Neurologic: Alert and oriented x 3.  Psych: Normal affect. Extremities: No clubbing or cyanosis.  HEENT: Normal.   Plan for LVAD Friday.  Has had orthopantogram, awaiting dental consult.    Will place PICC and remove Swan pre-op.   Volume status improved.  With low Na, will give a dose of tolvaptan today. Fluid restrict.   Check Impella position on echo => position deep, withdrawn to 5 cm.   Mobilize as much as possible with PT.   CRITICAL CARE Performed by: Marca Ancona  Total critical care time: 35 minutes  Critical care time was exclusive of separately billable procedures and treating other patients.  Critical care was necessary to treat or prevent imminent or life-threatening deterioration.  Critical care was time spent personally by me on the following activities: development of treatment plan with patient and/or surrogate as well as nursing, discussions with consultants, evaluation of patient's response to treatment, examination of patient, obtaining  history from patient or surrogate, ordering and performing treatments and interventions, ordering and review of laboratory studies, ordering and review of radiographic studies, pulse oximetry and re-evaluation of patient's condition.  Marca Ancona 11/15/2020 7:56 AM

## 2020-11-15 NOTE — Consult Note (Signed)
DENTAL VISIT INPATIENT CONSULTATION  Service Date:   11/15/2020 Admit Date:   11/08/2020 Referring Provider:                  Dr. Shirlee Latch, MD  Patient Name:   Kevin Underwood Date of Birth:   [birth year] Medical Record Number: [id number]    PLAN & RECOMMENDATIONS   >> There are no current signs of acute dental infection including abscess, edema or erythema, or suspicious lesion requiring biopsy, however the patient does have multiple teeth with deep cavities, retained dental roots and inflamed gingiva, which are causing localized chronic infection.   >> Recommend extractions of all indicated teeth as soon as possible to decrease the risk of perioperative and postoperative systemic infection and complications. We can schedule the patient for the operating room and complete all extractions/indicated dental treatment either before or after his cardiac surgery pending medical team's recommendations.    >> Recommend patient establish care at a dental office of his choice for routine dental care including replacement of missing teeth and exams. >> Discussed in detail all treatment options with the patient and he is agreeable to the plan.   Thank you for consulting with Hospital Dentistry and for the opportunity to participate in this patient's treatment.  Should you have any questions or concerns, please contact the [facility] at [phone].    Consult Note:  11/15/20    HPI: Kevin Underwood is a very pleasant 59 y.o. male with h/o CHF, CAD, unstable angina, A-fib, HTN, DM Type 2, Thrombocytopenia, Hyperlipidemia, COPD, OSA and arthritis who is currently admitted for CHF exacerbation and is anticipating LVAD placement on 12/10.  Dentistry was consulted to complete an evaluation as part of the patient's Pre-LVAD routine work-up.   Dental History: The patient reports that he has not seen a dentist in several years.  He reports noticing some of his teeth breaking off, and is aware  that he has cavities and broken down teeth.  He states that he would like his remaining teeth extracted so he can have dentures and prevent infection in the future.  He currently denies any dental/oral pain or sensitivity, however he does report some pain/discomfort associated with #12 where it has broken off and there is a sharp area occasionally. Patient able to manage oral secretions.  Patient denies dysphagia, odynophagia, dysphonia, SOB and neck pain.  Patient denies fever, rigors and malaise.   CHIEF COMPLAINT: "I would like to have the rest of my teeth out to make sure my heart doesn't get infected."   Patient Active Problem List   Diagnosis Date Noted  . Palliative care by specialist   . Goals of care, counseling/discussion   . Hyperlipidemia   . CHF exacerbation (HCC) 09/23/2020  . Unstable angina (HCC) 09/22/2020  . Chest tube in place   . 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 Cardiogenic shock (HCC)   . Acute respiratory failure (HCC)   . RUQ abdominal pain   . Cardiomyopathy (HCC) 11/21/2019  . Acute systolic CHF (congestive heart failure) (HCC) 11/21/2019  . History of alcohol abuse Quit 6 mos ago 11/21/2019  . History of recreational drug use 11/21/2019  . PAF (paroxysmal atrial fibrillation) (HCC) 11/21/2019  . Cardiac volume overload 11/21/2019  . Elevated brain natriuretic peptide (BNP) level 11/21/2019  . Leg edema, right 11/21/2019  . Hyponatremia 11/21/2019  . Abnormal electrocardiogram (ECG) (EKG) 11/21/2019  . Hyperglycemia 11/21/2019  . Tremulousness 11/21/2019  . Restlessness and agitation  11/21/2019  . OSA (obstructive sleep apnea) 11/21/2019  . Atrial fibrillation with RVR (HCC) 11/20/2019  . Acute congestive heart failure (HCC) 11/20/2019  . Acute respiratory distress 11/20/2019  . COPD with acute exacerbation (HCC)   . Hypertension   . Type 2 diabetes mellitus (HCC)   . Closed fracture of 5th metacarpal 08/31/2012   Past Medical History:  Diagnosis Date  .  Arthritis   . CAD (coronary artery disease)    a. s/p CABG in 11/2019 with LIMA-LAD, SVG-OM1, SVG-PDA and SVG-D1  . CHF (congestive heart failure) (HCC)    a. EF < 20% by echo in 11/2019  . COPD (chronic obstructive pulmonary disease) (HCC)   . Essential hypertension   . PAF (paroxysmal atrial fibrillation) (HCC)   . Type 2 diabetes mellitus (HCC)    Past Surgical History:  Procedure Laterality Date  . BACK SURGERY    . CLIPPING OF ATRIAL APPENDAGE N/A 11/26/2019   Procedure: Clipping Of Atrial Appendage using AtriCure 40 Clip;  Surgeon: [name], MD;  Location: [facility] OR;  Service: Open Heart Surgery;  Laterality: N/A;  . CORONARY ARTERY BYPASS GRAFT N/A 11/26/2019   Procedure: CORONARY ARTERY BYPASS GRAFTING (CABG) using endoscopic greater saphenous vein harvest: svc to OM; svc to Diag; svc to PD; and LIMA to LAD.;  Surgeon: [name], MD;  Location: [facility] OR;  Service: Open Heart Surgery;  Laterality: N/A;  . FOOT SURGERY    . HAND SURGERY    . INTRAOPERATIVE TRANSESOPHAGEAL ECHOCARDIOGRAM  12/04/2019   Procedure: Intraoperative Transesophageal Echocardiogram;  Surgeon: [name], MD;  Location: [facility] OR;  Service: Open Heart Surgery;;  . MAZE N/A 11/26/2019   Procedure: MAZE using Bilateral Pulmonary Vein isolation.;  Surgeon: [name], MD;  Location: [facility] OR;  Service: Open Heart Surgery;  Laterality: N/A;  . PLACEMENT OF IMPELLA LEFT VENTRICULAR ASSIST DEVICE N/A 11/24/2019   Procedure: PLACEMENT OF IMPELLA 5.5 LEFT VENTRICULAR ASSIST DEVICE;  Surgeon: [name], MD;  Location: [facility] OR;  Service: Open Heart Surgery;  Laterality: N/A;  . PLACEMENT OF IMPELLA LEFT VENTRICULAR ASSIST DEVICE Left 11/10/2020   Procedure: PLACEMENT OF IMPELLA 5.5 LEFT VENTRICULAR ASSIST DEVICE VIA  LEFT AVILLARY ARTERY;  Surgeon: [name], MD;  Location: [facility] OR;  Service: Open Heart Surgery;  Laterality: Left;  . REMOVAL OF IMPELLA LEFT VENTRICULAR ASSIST DEVICE Right  12/04/2019   Procedure: REMOVAL OF IMPELLA LEFT VENTRICULAR ASSIST DEVICE, right axilla;  Surgeon: [name], MD;  Location: [facility] OR;  Service: Open Heart Surgery;  Laterality: Right;  . RIGHT/LEFT HEART CATH AND CORONARY ANGIOGRAPHY N/A 11/24/2019   Procedure: RIGHT/LEFT HEART CATH AND CORONARY ANGIOGRAPHY;  Surgeon: Laurey Morale, MD;  Location: [facility] INVASIVE CV LAB;  Service: Cardiovascular;  Laterality: N/A;  . RIGHT/LEFT HEART CATH AND CORONARY/GRAFT ANGIOGRAPHY N/A 11/09/2020   Procedure: RIGHT/LEFT HEART CATH AND CORONARY/GRAFT ANGIOGRAPHY;  Surgeon: [name], MD;  Location: [facility] INVASIVE CV LAB;  Service: Cardiovascular;  Laterality: N/A;  . TEE WITHOUT CARDIOVERSION N/A 11/24/2019   Procedure: TRANSESOPHAGEAL ECHOCARDIOGRAM (TEE);  Surgeon: [name], MD;  Location: [facility] OR;  Service: Open Heart Surgery;  Laterality: N/A;  . TEE WITHOUT CARDIOVERSION N/A 11/26/2019   Procedure: TRANSESOPHAGEAL ECHOCARDIOGRAM (TEE);  Surgeon: [name], MD;  Location: [facility] OR;  Service: Open Heart Surgery;  Laterality: N/A;  . TEE WITHOUT CARDIOVERSION N/A 11/10/2020   Procedure: TRANSESOPHAGEAL ECHOCARDIOGRAM (TEE);  Surgeon: [name], MD;  Location: [facility]  OR;  Service: Open Heart Surgery;  Laterality: N/A;   No Known Allergies Current Facility-Administered Medications  Medication Dose Route Frequency Provider Last Rate Last Admin  . 0.9 %  sodium chloride infusion  250 mL Intravenous PRN End, Christopher, MD 10 mL/hr at 11/15/20 0600 Rate Verify at 11/15/20 0600  . acetaminophen (TYLENOL) tablet 650 mg  650 mg Oral Q4H PRN End, Cristal Deer, MD   650 mg at 11/08/20 1938  . albuterol (PROVENTIL) (2.5 MG/3ML) 0.083% nebulizer solution 2.5 mg  2.5 mg Nebulization Q6H PRN End, Cristal Deer, MD   2.5 mg at 11/09/20 0539  . amiodarone (NEXTERONE PREMIX) 360-4.14 MG/200ML-% (1.8 mg/mL) IV infusion  30 mg/hr Intravenous Continuous End, Christopher, MD 16.67 mL/hr at 11/15/20 0600 30  mg/hr at 11/15/20 0600  . aspirin EC tablet 81 mg  81 mg Oral Daily End, Christopher, MD   81 mg at 11/14/20 1007  . chlorhexidine (PERIDEX) 0.12 % solution 15 mL  15 mL Mouth/Throat BID Laurey Morale, MD   15 mL at 11/14/20 2130  . Chlorhexidine Gluconate Cloth 2 % PADS 6 each  6 each Topical Daily Parke Poisson, MD   6 each at 11/14/20 1600  . digoxin (LANOXIN) tablet 0.125 mg  0.125 mg Oral Daily End, Christopher, MD   0.125 mg at 11/14/20 1007  . feeding supplement (GLUCERNA SHAKE) (GLUCERNA SHAKE) liquid 237 mL  237 mL Oral TID BM Bensimhon, [name], MD   237 mL at 11/14/20 2100  . gabapentin (NEURONTIN) tablet 600 mg  600 mg Oral TID End, Christopher, MD   600 mg at 11/14/20 2130  . heparin ADULT infusion 100 units/mL (25000 units/26mL sodium chloride 0.45%)  750 Units/hr Intravenous Continuous Carney, Jessica C, RPH 7.5 mL/hr at 11/15/20 0600 750 Units/hr at 11/15/20 0600  . heparin Impella PURGE (50,000 units/1000 mL) 50 units/mL   Intracatheter Continuous Laurey Morale, MD   New Bag at 11/14/20 2138  . insulin aspart (novoLOG) injection 0-15 Units  0-15 Units Subcutaneous TID WC End, Cristal Deer, MD   8 Units at 11/15/20 [phone]  . insulin aspart (novoLOG) injection 0-5 Units  0-5 Units Subcutaneous QHS End, Christopher, MD   4 Units at 11/14/20 2131  . insulin aspart (novoLOG) injection 4 Units  4 Units Subcutaneous TID WC End, Cristal Deer, MD   4 Units at 11/15/20 [phone]  . insulin glargine (LANTUS) injection 20 Units  20 Units Subcutaneous QHS End, Christopher, MD   20 Units at 11/14/20 2129  . LORazepam (ATIVAN) tablet 1 mg  1 mg Oral QHS PRN Bensimhon, [name], MD      . magnesium oxide (MAG-OX) tablet 400 mg  400 mg Oral Daily End, Christopher, MD   400 mg at 11/14/20 1008  . MEDLINE mouth rinse  15 mL Mouth Rinse BID End, Cristal Deer, MD   15 mL at 11/14/20 2131  . milrinone (PRIMACOR) 20 MG/100 ML (0.2 mg/mL) infusion  0.375 mcg/kg/min Intravenous Continuous Laurey Morale, MD 9.19 mL/hr at 11/15/20 0600 0.375 mcg/kg/min at 11/15/20 0600  . mometasone-formoterol (DULERA) 200-5 MCG/ACT inhaler 2 puff  2 puff Inhalation BID End, Cristal Deer, MD   2 puff at 11/15/20 0839  . multivitamin with minerals tablet 1 tablet  1 tablet Oral Daily Parke Poisson, MD   1 tablet at 11/14/20 1008  . mupirocin ointment (BACTROBAN) 2 % 1 application  1 application Nasal BID [name], MD   1 application at 11/14/20 2132  . nitroGLYCERIN 50  mg in dextrose 5 % 250 mL (0.2 mg/mL) infusion  0-50 mcg/min Intravenous Titrated Laurey Morale, MD   Stopped at 11/12/20 1041  . norepinephrine (LEVOPHED)  in premix infusion  0-40 mcg/min Intravenous Titrated End, Christopher, MD      . ondansetron (ZOFRAN) injection 4 mg  4 mg Intravenous Q6H PRN End, Cristal Deer, MD      . oxyCODONE-acetaminophen (PERCOCET/ROXICET) 5-325 MG per tablet 1 tablet  1 tablet Oral Q8H PRN Laurey Morale, MD      . oxyCODONE-acetaminophen (PERCOCET/ROXICET) 5-325 MG per tablet 2 tablet  2 tablet Oral Q4H PRN Bensimhon, [name], MD   2 tablet at 11/15/20 [phone]  . pantoprazole (PROTONIX) EC tablet 40 mg  40 mg Oral Daily End, Christopher, MD   40 mg at 11/14/20 1008  . rosuvastatin (CRESTOR) tablet 40 mg  40 mg Oral q1800 End, Christopher, MD   40 mg at 11/14/20 1652  . sodium chloride flush (NS) 0.9 % injection 10-40 mL  10-40 mL Intracatheter Q12H Parke Poisson, MD   10 mL at 11/14/20 2132  . sodium chloride flush (NS) 0.9 % injection 10-40 mL  10-40 mL Intracatheter PRN Weston Brass A, MD      . sodium chloride flush (NS) 0.9 % injection 3 mL  3 mL Intravenous Q12H End, Christopher, MD   3 mL at 11/14/20 2132  . sodium chloride flush (NS) 0.9 % injection 3 mL  3 mL Intravenous Q12H End, Christopher, MD   3 mL at 11/14/20 2132  . sodium chloride flush (NS) 0.9 % injection 3 mL  3 mL Intravenous PRN End, Cristal Deer, MD   3 mL at 11/11/20 1028  . spironolactone (ALDACTONE) tablet 12.5  mg  12.5 mg Oral Daily Clegg, Amy D, NP   12.5 mg at 11/14/20 1008  . tolvaptan (SAMSCA) tablet 15 mg  15 mg Oral Once Laurey Morale, MD      . traZODone (DESYREL) tablet 50 mg  50 mg Oral QHS End, Christopher, MD   50 mg at 11/14/20 2130  . vasopressin (PITRESSIN) 20 Units in sodium chloride 0.9 % 100 mL infusion-*FOR SHOCK*  0-0.03 Units/min Intravenous Continuous [name], MD        LABS: Lab Results  Component Value Date   WBC 9.7 11/15/2020   HGB 11.5 (L) 11/15/2020   HCT 33.0 (L) 11/15/2020   MCV 83.5 11/15/2020   PLT 141 (L) 11/15/2020      Component Value Date/Time   NA 126 (L) 11/15/2020 0515   K 3.9 11/15/2020 0515   CL 87 (L) 11/15/2020 0515   CO2 29 11/15/2020 0515   GLUCOSE 299 (H) 11/15/2020 0515   BUN 20 11/15/2020 0515   CREATININE 0.75 11/15/2020 0515   CALCIUM 8.7 (L) 11/15/2020 0515   GFRNONAA >60 11/15/2020 0515   GFRAA >60 02/02/2020 1258   Lab Results  Component Value Date   INR 1.1 11/10/2020   INR 1.4 (H) 11/26/2019   No results found for: PTT  Social History   Socioeconomic History  . Marital status: Married    Spouse name: Not on file  . Number of children: Not on file  . Years of education: 37  . Highest education level: Not on file  Occupational History  . Not on file  Tobacco Use  . Smoking status: Former Smoker    Packs/day: 1.00    Years: 30.00    Pack years: 30.00    Types:  Cigarettes    Quit date: 12/20/2019    Years since quitting: 0.9  . Smokeless tobacco: Never Used  Vaping Use  . Vaping Use: Never used  Substance and Sexual Activity  . Alcohol use: Yes    Comment: Prior history of excessive intake  . Drug use: No  . Sexual activity: Not on file  Other Topics Concern  . Not on file  Social History Narrative  . Not on file   Social Determinants of Health   Financial Resource Strain: Low Risk   . Difficulty of Paying Living Expenses: Not very hard  Food Insecurity: No Food Insecurity  . Worried About  Programme researcher, broadcasting/film/video in the Last Year: Never true  . Ran Out of Food in the Last Year: Never true  Transportation Needs: No Transportation Needs  . Lack of Transportation (Medical): No  . Lack of Transportation (Non-Medical): No  Physical Activity: Inactive  . Days of Exercise per Week: 0 days  . Minutes of Exercise per Session: 0 min  Stress:   . Feeling of Stress : Not on file  Social Connections:   . Frequency of Communication with Friends and Family: Not on file  . Frequency of Social Gatherings with Friends and Family: Not on file  . Attends Religious Services: Not on file  . Active Member of Clubs or Organizations: Not on file  . Attends Banker Meetings: Not on file  . Marital Status: Not on file  Intimate Partner Violence:   . Fear of Current or Ex-Partner: Not on file  . Emotionally Abused: Not on file  . Physically Abused: Not on file  . Sexually Abused: Not on file   Family History  Problem Relation Age of Onset  . Arthritis Other   . Lung disease Other   . Asthma Other   . Diabetes Other   . Heart disease Sister        Tumor?    REVIEW OF SYSTEMS: Reviewed with the patient as per HPI. PSYCH: Patient denies having dental phobia.   VITAL SIGNS: BP 120/89   Pulse 77   Temp 98.7 F (37.1 C) (Oral)   Resp 16   Ht 5\' 10"  (1.778 m)   Wt 79.2 kg   SpO2 97%   BMI 25.05 kg/m    PHYSICAL EXAMINATION: GENERAL: Well-developed, comfortable and in no apparent distress. NEUROLOGICAL: Alert and oriented to person, place and time. EXTRAORAL:  Facial symmetry present without any edema or erythema.  No swelling or lymphadenopathy. INTRAORAL: Soft tissues appear well-perfused and mucous membranes moist.  FOM and vestibules soft and not raised. Oral cavity without mass or lesion. No signs of infection, parulis, sinus tract, edema or erythema evident upon exam.  DENTAL EXAMINATION: DENTITION: Overall poor remaining dentition.  Missing teeth, caries/retained  root tips, excessive attrition on incisal edges of remaining anterior teeth, Non-functioning teeth. Diastema present b/w #8 and #9. PERIODONTAL: Inflamed, erythematous gingival tissue surrounding remaining dentition with mild plaque and calculus accumulation.  Generalized moderate gingival recession. #25 Class II mobility. DENTAL CARIES/DEFECTIVE RESTORATIONS: #7, #11, #12 and #29 retained root tips d/t severe decay. Clinical caries present in all 4 quadrants. #22, #23, #25, #27 and #28 root caries. PROSTHODONTIC: Patient denies wearing partial dentures. OCCLUSION: Collapsed VDO due to missing posterior Maxillary and Mandibular posterior teeth.  RADIOGRAPHIC EXAMINATION Orthopanogram exposed (11/14/20) and interpreted: Condyles seated bilaterally in fossas.  No evidence of abnormal pathology.  All visualized osseous structures appear WNL. Generalized moderate horizontal  bone loss consistent with chronic moderate periodontitis. Missing teeth, caries. #7, #11, #12 and #29 retained root tips. #25 appears to have a widened PDL without obvious periapical radiolucency.   ASSESSMENT 1. Pre-LVAD Dental Consultation 2. Congestive Heart Failure 3. CAD, Unstable angina, A-fib 4. HTN 5. Diabetes Mellitus Type 2 6. Thrombocytopenia 7. COPD, Acute respiratory failure, OSA 8. Hyperlipidemia 9. H/o drug and alcohol abuse 10. Arthritis 11. Missing teeth 12. Caries 13. 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 Chronic periodontitis 16. Gingival recession 17. Retained dental root 18. Loose teeth 19. Diastema 20. Postoperative bleeding risk   PLAN/RECOMMENDATIONS  . I discussed the risks, benefits, and complications of various treatment options with the patient in relationship to his medical and dental conditions.  Discussion included the risk of dental/oral infection becoming systemic and infecting his heart before or following surgery if dental issues are not addressed.  Discussed  the importance of maintaining optimal oral health following heart surgery to prevent inflammation and infection in his mouth which could cause systemic infections/complications in the future.  . We discussed various treatment options to include no treatment, multiple extractions with alveoloplasty, pre-prosthetic surgery as indicated, periodontal therapy, dental restorations, root canal therapy, crown and bridge therapy, implant therapy, and replacement of missing teeth as indicated.  The patient verbalized understanding of all options, and currently wishes to proceed with extractions of all indicated teeth, as well as all teeth with questionable prognosis and/or restorable teeth so he can have complete dentures made in the future.  Patient's remaining teeth that are restorable have either significant bone loss, mobility, or caries/root caries that would not make adequate long-term partial denture abutment teeth, so I told pt that I would be comfortable extracting all of his remaining dentition.  . Discussion of findings with medical team and coordination of future medical and dental care as needed.  Depending on recommendations from medical team, may defer dental treatment to after LVAD placement.   The patient tolerated today's visit well.  All questions/concerns were addressed.    Jarika Robben B. Chales Salmon, DMD

## 2020-11-15 NOTE — Progress Notes (Signed)
CSW contacted wife to discuss concerns related to son residing in their home. Patient's wife was told by landlord on Monday that they will be evicted if their 59yo son who is not on the section 8 lease does not move out immediately. Wife overwhelmed stated that she spoke with her son last night for 3 hours and he refused to leave. Wife stated she is trying to handle and avoid stressing patient out over this issue prior to surgery. CSW asked permission to contact landlord and wife provided name Crystal at [phone] as the landlord contact. CSW called and spoke with Crystal who was empathic to the concerns and shared the section 8 housing rules and that the son needs to leave immediately. Landlord state she will take responsibility of informing the son to leave and will notify the police if needed. CSW returned call to wife to inform that [employer] [place] will take the responsibility and relieving the wife of removing her son. Wife is grateful for the support and assistance. CSW informed wife that CSW will let patient know that the issue will be handles in the morning by the landlord. CSW continues to follow. Lasandra Beech, LCSW, CCSW-MCS [phone]

## 2020-11-15 NOTE — Progress Notes (Signed)
Insurance authorization obtained from [facility] for scheduled LVAD implant this week:        Hessie Diener RN, VAD Coordinator [phone]

## 2020-11-16 ENCOUNTER — Encounter (HOSPITAL_COMMUNITY)
Admission: EM | Disposition: A | Discharge status: Home / Self Care | Payer: Self-pay | Source: Home / Self Care | Attending: Cardiothoracic Surgery

## 2020-11-16 ENCOUNTER — Inpatient Hospital Stay (HOSPITAL_COMMUNITY): Payer: 59

## 2020-11-16 ENCOUNTER — Encounter (HOSPITAL_COMMUNITY): Payer: Self-pay | Admitting: Cardiology

## 2020-11-16 ENCOUNTER — Inpatient Hospital Stay (HOSPITAL_COMMUNITY): Payer: 59 | Admitting: Anesthesiology

## 2020-11-16 DIAGNOSIS — I5023 Acute on chronic systolic (congestive) heart failure: Secondary | ICD-10-CM

## 2020-11-16 HISTORY — PX: MULTIPLE EXTRACTIONS WITH ALVEOLOPLASTY: SHX5342

## 2020-11-16 LAB — COOXEMETRY PANEL
Carboxyhemoglobin: 2.1 % — ABNORMAL HIGH (ref 0.5–1.5)
Carboxyhemoglobin: 2.1 % — ABNORMAL HIGH (ref 0.5–1.5)
Methemoglobin: 1.1 % (ref 0.0–1.5)
Methemoglobin: 0.8 % (ref 0.0–1.5)
O2 Saturation: 52.8 %
O2 Saturation: 58.2 %
Total hemoglobin: 11.7 g/dL — ABNORMAL LOW (ref 12.0–16.0)
Total hemoglobin: 11.9 g/dL — ABNORMAL LOW (ref 12.0–16.0)

## 2020-11-16 LAB — CBC
HCT: 35.1 % — ABNORMAL LOW (ref 39.0–52.0)
Hemoglobin: 11.4 g/dL — ABNORMAL LOW (ref 13.0–17.0)
MCH: 28.1 pg (ref 26.0–34.0)
MCHC: 32.5 g/dL (ref 30.0–36.0)
MCV: 86.5 fL (ref 80.0–100.0)
Platelets: 155 10*3/uL (ref 150–400)
RBC: 4.06 MIL/uL — ABNORMAL LOW (ref 4.22–5.81)
RDW: 14.9 % (ref 11.5–15.5)
WBC: 10.2 10*3/uL (ref 4.0–10.5)
nRBC: 0 % (ref 0.0–0.2)

## 2020-11-16 LAB — GLUCOSE, CAPILLARY
Glucose-Capillary: 239 mg/dL — ABNORMAL HIGH (ref 70–99)
Glucose-Capillary: 165 mg/dL — ABNORMAL HIGH (ref 70–99)
Glucose-Capillary: 196 mg/dL — ABNORMAL HIGH (ref 70–99)
Glucose-Capillary: 385 mg/dL — ABNORMAL HIGH (ref 70–99)
Glucose-Capillary: 266 mg/dL — ABNORMAL HIGH (ref 70–99)

## 2020-11-16 LAB — ECHOCARDIOGRAM LIMITED
Height: 70 in
Weight: 2768.98 oz

## 2020-11-16 LAB — BASIC METABOLIC PANEL
Anion gap: 12 (ref 5–15)
BUN: 18 mg/dL (ref 6–20)
CO2: 27 mmol/L (ref 22–32)
Calcium: 8.8 mg/dL — ABNORMAL LOW (ref 8.9–10.3)
Chloride: 88 mmol/L — ABNORMAL LOW (ref 98–111)
Creatinine, Ser: 0.87 mg/dL (ref 0.61–1.24)
GFR, Estimated: >60 mL/min (ref >60)
Glucose, Bld: 395 mg/dL — ABNORMAL HIGH (ref 70–99)
Potassium: 3.8 mmol/L (ref 3.5–5.1)
Sodium: 127 mmol/L — ABNORMAL LOW (ref 135–145)

## 2020-11-16 LAB — HEPARIN LEVEL (UNFRACTIONATED): Heparin Unfractionated: 0.15 IU/mL — ABNORMAL LOW (ref 0.30–0.70)

## 2020-11-16 LAB — CREATININE, SERUM
Creatinine, Ser: 0.88 mg/dL (ref 0.61–1.24)
GFR, Estimated: >60 mL/min (ref >60)

## 2020-11-16 LAB — HEPATIC FUNCTION PANEL
ALT: 20 U/L (ref 0–44)
AST: 22 U/L (ref 15–41)
Albumin: 3 g/dL — ABNORMAL LOW (ref 3.5–5.0)
Alkaline Phosphatase: 81 U/L (ref 38–126)
Bilirubin, Direct: 0.2 mg/dL (ref 0.0–0.2)
Indirect Bilirubin: 0.8 mg/dL (ref 0.3–0.9)
Total Bilirubin: 1 mg/dL (ref 0.3–1.2)
Total Protein: 6.4 g/dL — ABNORMAL LOW (ref 6.5–8.1)

## 2020-11-16 LAB — LACTATE DEHYDROGENASE: LDH: 291 U/L — ABNORMAL HIGH (ref 98–192)

## 2020-11-16 SURGERY — MULTIPLE EXTRACTION WITH ALVEOLOPLASTY
Anesthesia: General

## 2020-11-16 MED ORDER — VANCOMYCIN HCL 1250 MG/250ML IV SOLN
1250.0000 mg | INTRAVENOUS | Status: AC
  Administered 2020-11-17: 1250 mg via INTRAVENOUS
  Filled 2020-11-16: qty 250

## 2020-11-16 MED ORDER — SODIUM CHLORIDE 0.9 % IV SOLN
600.0000 mg | INTRAVENOUS | Status: AC
  Administered 2020-11-17: 600 mg via INTRAVENOUS
  Filled 2020-11-16: qty 600

## 2020-11-16 MED ORDER — MILRINONE LACTATE IN DEXTROSE 20-5 MG/100ML-% IV SOLN
0.3000 ug/kg/min | INTRAVENOUS | Status: DC
  Filled 2020-11-16: qty 100

## 2020-11-16 MED ORDER — ROCURONIUM BROMIDE 10 MG/ML (PF) SYRINGE
PREFILLED_SYRINGE | INTRAVENOUS | Status: DC | PRN
  Administered 2020-11-16: 50 mg via INTRAVENOUS

## 2020-11-16 MED ORDER — NITROGLYCERIN IN D5W 200-5 MCG/ML-% IV SOLN
0.0000 ug/min | INTRAVENOUS | Status: DC
  Filled 2020-11-16: qty 250

## 2020-11-16 MED ORDER — LACTATED RINGERS IV SOLN: INTRAVENOUS | Status: DC

## 2020-11-16 MED ORDER — INSULIN GLARGINE 100 UNIT/ML ~~LOC~~ SOLN
15.0000 [IU] | Freq: Two times a day (BID) | SUBCUTANEOUS | Status: DC
  Administered 2020-11-16 (×2): 15 [IU] via SUBCUTANEOUS
  Filled 2020-11-16 (×4): qty 0.15

## 2020-11-16 MED ORDER — FENTANYL CITRATE (PF) 250 MCG/5ML IJ SOLN
INTRAMUSCULAR | Status: AC
  Filled 2020-11-16: qty 5

## 2020-11-16 MED ORDER — DOPAMINE-DEXTROSE 3.2-5 MG/ML-% IV SOLN
0.0000 ug/kg/min | INTRAVENOUS | Status: DC
  Filled 2020-11-16: qty 250

## 2020-11-16 MED ORDER — PHENYLEPHRINE 40 MCG/ML (10ML) SYRINGE FOR IV PUSH (FOR BLOOD PRESSURE SUPPORT)
PREFILLED_SYRINGE | INTRAVENOUS | Status: DC | PRN
  Administered 2020-11-16: 120 ug via INTRAVENOUS
  Administered 2020-11-16 (×2): 80 ug via INTRAVENOUS
  Administered 2020-11-16: 120 ug via INTRAVENOUS

## 2020-11-16 MED ORDER — DEXMEDETOMIDINE HCL IN NACL 400 MCG/100ML IV SOLN
0.1000 ug/kg/h | INTRAVENOUS | Status: DC
  Filled 2020-11-16: qty 100

## 2020-11-16 MED ORDER — BUPIVACAINE-EPINEPHRINE 0.5% -1:200000 IJ SOLN
INTRAMUSCULAR | Status: DC | PRN
  Administered 2020-11-16: 1.8 mL

## 2020-11-16 MED ORDER — AMINOCAPROIC ACID SOLUTION 5% (50 MG/ML)
ORAL | Status: DC | PRN
  Administered 2020-11-16: 10 mL via ORAL

## 2020-11-16 MED ORDER — BUPIVACAINE-EPINEPHRINE (PF) 0.5% -1:200000 IJ SOLN
INTRAMUSCULAR | Status: AC
  Filled 2020-11-16: qty 1.8

## 2020-11-16 MED ORDER — LIDOCAINE 2% (20 MG/ML) 5 ML SYRINGE
INTRAMUSCULAR | Status: DC | PRN
  Administered 2020-11-16: 80 mg via INTRAVENOUS

## 2020-11-16 MED ORDER — CEFAZOLIN SODIUM-DEXTROSE 2-4 GM/100ML-% IV SOLN
2.0000 g | INTRAVENOUS | Status: AC
  Administered 2020-11-16: 2 g via INTRAVENOUS
  Filled 2020-11-16: qty 100

## 2020-11-16 MED ORDER — INSULIN REGULAR(HUMAN) IN NACL 100-0.9 UT/100ML-% IV SOLN
INTRAVENOUS | Status: DC
  Filled 2020-11-16: qty 100

## 2020-11-16 MED ORDER — PHENYLEPHRINE HCL-NACL 10-0.9 MG/250ML-% IV SOLN
INTRAVENOUS | Status: DC | PRN
  Administered 2020-11-16: 25 ug/min via INTRAVENOUS

## 2020-11-16 MED ORDER — CHLORHEXIDINE GLUCONATE 0.12 % MT SOLN
15.0000 mL | Freq: Once | OROMUCOSAL | Status: AC
  Administered 2020-11-17: 15 mL via OROMUCOSAL

## 2020-11-16 MED ORDER — EPINEPHRINE HCL 5 MG/250ML IV SOLN IN NS
0.0000 ug/min | INTRAVENOUS | Status: AC
  Administered 2020-11-17: 08:00:00 2 ug/min via INTRAVENOUS
  Filled 2020-11-16: qty 250

## 2020-11-16 MED ORDER — LIDOCAINE-EPINEPHRINE 2 %-1:100000 IJ SOLN
INTRAMUSCULAR | Status: DC | PRN
  Administered 2020-11-16: 6.8 mL via INTRADERMAL

## 2020-11-16 MED ORDER — METOPROLOL TARTRATE 12.5 MG HALF TABLET: 12.5000 mg | ORAL_TABLET | Freq: Once | ORAL | Status: DC

## 2020-11-16 MED ORDER — ROCURONIUM BROMIDE 10 MG/ML (PF) SYRINGE
PREFILLED_SYRINGE | INTRAVENOUS | Status: AC
  Filled 2020-11-16: qty 10

## 2020-11-16 MED ORDER — 0.9 % SODIUM CHLORIDE (POUR BTL) OPTIME
TOPICAL | Status: DC | PRN
  Administered 2020-11-16: 1000 mL

## 2020-11-16 MED ORDER — DEXAMETHASONE SODIUM PHOSPHATE 10 MG/ML IJ SOLN
INTRAMUSCULAR | Status: DC | PRN
  Administered 2020-11-16: 10 mg via INTRAVENOUS

## 2020-11-16 MED ORDER — PHENYLEPHRINE HCL-NACL 20-0.9 MG/250ML-% IV SOLN
0.0000 ug/min | INTRAVENOUS | Status: AC
  Administered 2020-11-17: 25 ug/min via INTRAVENOUS
  Filled 2020-11-16: qty 250

## 2020-11-16 MED ORDER — DOBUTAMINE IN D5W 4-5 MG/ML-% IV SOLN
2.0000 ug/kg/min | INTRAVENOUS | Status: DC
  Filled 2020-11-16: qty 250

## 2020-11-16 MED ORDER — CHLORHEXIDINE GLUCONATE CLOTH 2 % EX PADS
6.0000 | MEDICATED_PAD | Freq: Once | CUTANEOUS | Status: AC
  Administered 2020-11-16: 6 via TOPICAL

## 2020-11-16 MED ORDER — CHLORHEXIDINE GLUCONATE CLOTH 2 % EX PADS
6.0000 | MEDICATED_PAD | Freq: Once | CUTANEOUS | Status: AC
  Administered 2020-11-17: 6 via TOPICAL

## 2020-11-16 MED ORDER — SODIUM CHLORIDE 0.9 % IV SOLN
INTRAVENOUS | Status: DC
  Filled 2020-11-16: qty 30

## 2020-11-16 MED ORDER — ACETAMINOPHEN 500 MG PO TABS: 1000.0000 mg | ORAL_TABLET | Freq: Once | ORAL | Status: DC

## 2020-11-16 MED ORDER — TEMAZEPAM 15 MG PO CAPS
15.0000 mg | ORAL_CAPSULE | Freq: Once | ORAL | Status: AC | PRN
  Administered 2020-11-16: 15 mg via ORAL
  Filled 2020-11-16: qty 1

## 2020-11-16 MED ORDER — NOREPINEPHRINE 4 MG/250ML-% IV SOLN
0.0000 ug/min | INTRAVENOUS | Status: AC
  Administered 2020-11-17: 2 ug/min via INTRAVENOUS
  Filled 2020-11-16: qty 250

## 2020-11-16 MED ORDER — LIDOCAINE-EPINEPHRINE 2 %-1:100000 IJ SOLN
INTRAMUSCULAR | Status: AC
  Filled 2020-11-16: qty 6.8

## 2020-11-16 MED ORDER — PHENYLEPHRINE 40 MCG/ML (10ML) SYRINGE FOR IV PUSH (FOR BLOOD PRESSURE SUPPORT)
PREFILLED_SYRINGE | INTRAVENOUS | Status: AC
  Filled 2020-11-16: qty 10

## 2020-11-16 MED ORDER — TOLVAPTAN 15 MG PO TABS
15.0000 mg | ORAL_TABLET | Freq: Once | ORAL | Status: AC
  Administered 2020-11-16: 15 mg via ORAL
  Filled 2020-11-16: qty 1

## 2020-11-16 MED ORDER — POTASSIUM CHLORIDE CRYS ER 20 MEQ PO TBCR
40.0000 meq | EXTENDED_RELEASE_TABLET | Freq: Once | ORAL | Status: AC
  Administered 2020-11-16: 40 meq via ORAL
  Filled 2020-11-16: qty 2

## 2020-11-16 MED ORDER — POTASSIUM CHLORIDE 2 MEQ/ML IV SOLN
80.0000 meq | INTRAVENOUS | Status: DC
  Filled 2020-11-16: qty 40

## 2020-11-16 MED ORDER — AMINOCAPROIC ACID SOLUTION 5% (50 MG/ML)
5.0000 mL | ORAL | Status: DC | PRN
  Administered 2020-11-16: 5 mL via ORAL
  Filled 2020-11-16 (×2): qty 100

## 2020-11-16 MED ORDER — VANCOMYCIN HCL 1000 MG IV SOLR
1000.0000 mg | INTRAVENOUS | Status: DC
  Filled 2020-11-16: qty 1000

## 2020-11-16 MED ORDER — TRANEXAMIC ACID (OHS) BOLUS VIA INFUSION
15.0000 mg/kg | INTRAVENOUS | Status: AC
  Administered 2020-11-17: 1177.5 mg via INTRAVENOUS
  Filled 2020-11-16: qty 1178

## 2020-11-16 MED ORDER — VASOPRESSIN 20 UNITS/100 ML INFUSION FOR SHOCK
0.0400 [IU]/min | INTRAVENOUS | Status: DC
  Filled 2020-11-16: qty 100

## 2020-11-16 MED ORDER — SODIUM CHLORIDE 0.9 % IV SOLN
750.0000 mg | INTRAVENOUS | Status: AC
  Administered 2020-11-17: 750 mg via INTRAVENOUS
  Filled 2020-11-16: qty 750

## 2020-11-16 MED ORDER — ETOMIDATE 2 MG/ML IV SOLN
INTRAVENOUS | Status: AC
  Filled 2020-11-16: qty 10

## 2020-11-16 MED ORDER — LACTATED RINGERS IV SOLN: INTRAVENOUS | Status: DC | PRN

## 2020-11-16 MED ORDER — FLUCONAZOLE IN SODIUM CHLORIDE 400-0.9 MG/200ML-% IV SOLN
400.0000 mg | INTRAVENOUS | Status: AC
  Administered 2020-11-17: 400 mg via INTRAVENOUS
  Filled 2020-11-16: qty 200

## 2020-11-16 MED ORDER — MAGNESIUM SULFATE 50 % IJ SOLN
40.0000 meq | INTRAMUSCULAR | Status: DC
  Filled 2020-11-16: qty 9.85

## 2020-11-16 MED ORDER — HEPARIN (PORCINE) 25000 UT/250ML-% IV SOLN
750.0000 [IU]/h | INTRAVENOUS | Status: DC
  Administered 2020-11-16: 750 [IU]/h via INTRAVENOUS
  Filled 2020-11-16: qty 250

## 2020-11-16 MED ORDER — FENTANYL CITRATE (PF) 250 MCG/5ML IJ SOLN
INTRAMUSCULAR | Status: DC | PRN
  Administered 2020-11-16: 75 ug via INTRAVENOUS
  Administered 2020-11-16: 25 ug via INTRAVENOUS

## 2020-11-16 MED ORDER — TRANEXAMIC ACID (OHS) PUMP PRIME SOLUTION
2.0000 mg/kg | INTRAVENOUS | Status: DC
  Filled 2020-11-16: qty 1.57

## 2020-11-16 MED ORDER — ONDANSETRON HCL 4 MG/2ML IJ SOLN
INTRAMUSCULAR | Status: DC | PRN
  Administered 2020-11-16: 4 mg via INTRAVENOUS

## 2020-11-16 MED ORDER — ETOMIDATE 2 MG/ML IV SOLN
INTRAVENOUS | Status: DC | PRN
  Administered 2020-11-16: 16 mg via INTRAVENOUS

## 2020-11-16 MED ORDER — SUGAMMADEX SODIUM 200 MG/2ML IV SOLN
INTRAVENOUS | Status: DC | PRN
  Administered 2020-11-16: 150 mg via INTRAVENOUS

## 2020-11-16 MED ORDER — SODIUM CHLORIDE 0.9 % IV SOLN
1.5000 g | INTRAVENOUS | Status: AC
  Administered 2020-11-17: 1.5 g via INTRAVENOUS
  Filled 2020-11-16: qty 1.5

## 2020-11-16 MED ORDER — BISACODYL 5 MG PO TBEC
5.0000 mg | DELAYED_RELEASE_TABLET | Freq: Once | ORAL | Status: AC
  Administered 2020-11-16: 5 mg via ORAL
  Filled 2020-11-16: qty 1

## 2020-11-16 MED ORDER — ACETAMINOPHEN 325 MG PO TABS
325.0000 mg | ORAL_TABLET | Freq: Once | ORAL | Status: AC
  Administered 2020-11-16: 325 mg via ORAL
  Filled 2020-11-16: qty 1

## 2020-11-16 MED ORDER — TRANEXAMIC ACID 1000 MG/10ML IV SOLN
1.5000 mg/kg/h | INTRAVENOUS | Status: AC
  Administered 2020-11-17: 1.5 mg/kg/h via INTRAVENOUS
  Filled 2020-11-16: qty 25

## 2020-11-16 SURGICAL SUPPLY — 44 items
ALCOHOL 70% 16 OZ (MISCELLANEOUS) ×3 IMPLANT
BANDAGE HEMOSTAT MRDH 4X4 STRL (MISCELLANEOUS) IMPLANT
BLADE SURG 15 STRL LF DISP TIS (BLADE) ×2 IMPLANT
BLADE SURG 15 STRL SS (BLADE) ×3
BNDG HEMOSTAT MRDH 4X4 STRL (MISCELLANEOUS)
CNTNR URN SCR LID CUP LEK RST (MISCELLANEOUS) IMPLANT
CONT SPEC 4OZ STRL OR WHT (MISCELLANEOUS) ×3
COVER SURGICAL LIGHT HANDLE (MISCELLANEOUS) ×3 IMPLANT
COVER WAND RF STERILE (DRAPES) ×1 IMPLANT
GAUZE 4X4 16PLY RFD (DISPOSABLE) ×3 IMPLANT
GAUZE PACKING FOLDED 2  STR (GAUZE/BANDAGES/DRESSINGS) ×4
GAUZE PACKING FOLDED 2 STR (GAUZE/BANDAGES/DRESSINGS) ×1 IMPLANT
GLOVE BIO SURGEON STRL SZ 6.5 (GLOVE) ×2 IMPLANT
GLOVE BIO SURGEONS STRL SZ 6.5 (GLOVE) ×1
GLOVE SURG SS PI 6.0 STRL IVOR (GLOVE) ×3 IMPLANT
GOWN STRL REUS W/ TWL LRG LVL3 (GOWN DISPOSABLE) ×2 IMPLANT
GOWN STRL REUS W/TWL LRG LVL3 (GOWN DISPOSABLE) ×6
HEMOSTAT SURGICEL 2X14 (HEMOSTASIS) IMPLANT
KIT BASIN OR (CUSTOM PROCEDURE TRAY) ×3 IMPLANT
KIT TURNOVER KIT B (KITS) ×3 IMPLANT
MANIFOLD NEPTUNE II (INSTRUMENTS) ×3 IMPLANT
NDL BLUNT 16X1.5 OR ONLY (NEEDLE) IMPLANT
NDL DENTAL 27 LONG (NEEDLE) IMPLANT
NEEDLE BLUNT 16X1.5 OR ONLY (NEEDLE) ×3 IMPLANT
NEEDLE DENTAL 27 LONG (NEEDLE) ×6 IMPLANT
NS IRRIG 1000ML POUR BTL (IV SOLUTION) ×3 IMPLANT
PACK EENT II TURBAN DRAPE (CUSTOM PROCEDURE TRAY) ×3 IMPLANT
PAD ARMBOARD 7.5X6 YLW CONV (MISCELLANEOUS) ×3 IMPLANT
SPONGE SURGIFOAM ABS GEL 100 (HEMOSTASIS) IMPLANT
SPONGE SURGIFOAM ABS GEL 12-7 (HEMOSTASIS) IMPLANT
SPONGE SURGIFOAM ABS GEL SZ50 (HEMOSTASIS) IMPLANT
SUCTION FRAZIER HANDLE 10FR (MISCELLANEOUS) ×2
SUCTION TUBE FRAZIER 10FR DISP (MISCELLANEOUS) ×1 IMPLANT
SUT CHROMIC 3 0 PS 2 (SUTURE) ×6 IMPLANT
SUT CHROMIC 4 0 P 3 18 (SUTURE) IMPLANT
SYR 50ML LL SCALE MARK (SYRINGE) ×2 IMPLANT
SYR 50ML SLIP (SYRINGE) ×1 IMPLANT
SYR BULB IRRIG 60ML STRL (SYRINGE) ×3 IMPLANT
TOWEL GREEN STERILE FF (TOWEL DISPOSABLE) ×3 IMPLANT
TUBE CONNECTING 12'X1/4 (SUCTIONS) ×1
TUBE CONNECTING 12X1/4 (SUCTIONS) ×2 IMPLANT
WATER STERILE IRR 1000ML POUR (IV SOLUTION) ×3 IMPLANT
WATER TABLETS ICX (MISCELLANEOUS) ×3 IMPLANT
YANKAUER SUCT BULB TIP NO VENT (SUCTIONS) ×3 IMPLANT

## 2020-11-16 NOTE — Evaluation (Signed)
Physical Therapy Evaluation and D/C Patient Details Name: Kevin Underwood MRN: [id number] DOB: 18-Jun-1961 Today's Date: 11/16/2020   History of Present Illness  Kevin Underwood is a 59 y.o. 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 type 2 diabetes mellitus, COPD, and hypertension. Plan is for LVAD Fri, 12/10.  Clinical Impression  Pt admitted with above diagnosis.Pt was able to ambulate on unit with min guard assist with RW with +2 for line management only.  Pt walked entire unit with VSS - HR 70's-80's,O2 >90% on RA.  Impella rep present during walk.  Pt able to walk 420 feet in 6 minutes. Given that surgery is planned for 12/10, will not set goals for pt and will await reorder after surgery.  Sign off.       Follow Up Recommendations Other (comment) (TBA after LVAD surgery)    Equipment Recommendations   (Possible rollator)    Recommendations for Other Services       Precautions / Restrictions Precautions Precautions: None Restrictions Weight Bearing Restrictions: Yes (Impella L arm) LUE Weight Bearing:  (limit use of left UE)      Mobility  Bed Mobility Overal bed mobility: Needs Assistance Bed Mobility: Rolling;Sidelying to Sit Rolling: Min assist Sidelying to sit: Min assist       General bed mobility comments: Needed min assist due to limit use of left UE due to Impella    Transfers Overall transfer level: Needs assistance Equipment used: Rolling walker (2 wheeled) Transfers: Sit to/from Stand Sit to Stand: Min guard;+2 safety/equipment         General transfer comment: Pt needed +2 for line management only.  Pt did not need physical assist at all.  Pt able to steady himself with RW.  Ambulation/Gait Ambulation/Gait assistance: Min guard;+2 safety/equipment Gait Distance (Feet): 420  Feet Assistive device: Rolling walker (2 wheeled) Gait Pattern/deviations: Step-through pattern;Decreased stride length   Gait velocity interpretation: <1.31 ft/sec, indicative of household ambulator General Gait Details: Pt walked unit with +2 for lines for Impella device and IV pole.Did not need physical assist.  Just guard assist for safety.  Pt needed cues to limit use of left UE and reports that left UE is sore.   6 minute walk test 420 feet  PRe HR 74 bpm, post HR 85bpm Pre O2 96% on RA, post O2 93% on RA BP stable DOE 1/4 throughout  [employer]    Modified Rankin (Stroke Patients Only)       Balance Overall balance assessment: Needs assistance Sitting-balance support: No upper extremity supported;Feet supported Sitting balance-Leahy Scale: Fair     Standing balance support: Bilateral upper extremity supported;During functional activity Standing balance-Leahy Scale: Poor Standing balance comment: relies on at least 1UE support for balance with dynamic activity.  Static stance without support.                             Pertinent Vitals/Pain Pain Assessment: Faces Faces Pain Scale: Hurts whole lot Pain Location: left UE Pain Descriptors / Indicators: Discomfort Pain Intervention(s): Limited activity within patient's tolerance;Monitored during session;Repositioned    Home Living Family/patient expects to be discharged to:: Private residence Living Arrangements: Spouse/significant other Available Help at Discharge: Family;Available 24 hours/day  Type of Home: Apartment Home Access: Stairs to enter Entrance Stairs-Rails: Right Entrance Stairs-Number of Steps: flight Home Layout: One level Home Equipment: Emergency planning/management officer - 2 wheels Additional Comments: did not use equipment before admit    Prior Function Level of Independence: Independent         Comments: Chart states that pt may go to Mothers home at d/c  therefore need to clarify steps and bathroom set up there after LVAD surgery     Hand Dominance   Dominant Hand: Right    Extremity/Trunk Assessment   Upper Extremity Assessment Upper Extremity Assessment: Defer to OT evaluation    Lower Extremity Assessment Lower Extremity Assessment: Overall WFL for tasks assessed    Cervical / Trunk Assessment Cervical / Trunk Assessment: Normal  Communication   Communication: No difficulties  Cognition Arousal/Alertness: Awake/alert Behavior During Therapy: WFL for tasks assessed/performed Overall Cognitive Status: Within Functional Limits for tasks assessed                                        General Comments      Exercises     Assessment/Plan    PT Assessment Patent does not need any further PT services  PT Problem List Decreased mobility;Decreased knowledge of use of DME;Decreased safety awareness;Decreased knowledge of precautions;Cardiopulmonary status limiting activity;Pain       PT Treatment Interventions Therapeutic activities;Gait training;DME instruction;Patient/family education    PT Goals (Current goals can be found in the Care Plan section)  Acute Rehab PT Goals Patient Stated Goal: to have surgery PT Goal Formulation: All assessment and education complete, DC therapy    Frequency     Barriers to discharge        Co-evaluation               AM-PAC PT "6 Clicks" Mobility  Outcome Measure Help needed turning from your back to your side while in a flat bed without using bedrails?: A Little Help needed moving from lying on your back to sitting on the side of a flat bed without using bedrails?: A Little Help needed moving to and from a bed to a chair (including a wheelchair)?: A Little Help needed standing up from a chair using your arms (e.g., wheelchair or bedside chair)?: A Little Help needed to walk in hospital room?: A Little Help needed climbing 3-5 steps with a railing? : A  Little 6 Click Score: 18    End of Session Equipment Utilized During Treatment: Gait belt Activity Tolerance: Patient limited by fatigue Patient left: in bed;with call bell/phone within reach;with nursing/sitter in room Nurse Communication: Mobility status PT Visit Diagnosis: Muscle weakness (generalized) (M62.81)    Time: 2355-7322 PT Time Calculation (min) (ACUTE ONLY): 31 min   Charges:   PT Evaluation $PT Eval Moderate Complexity: 1 Mod PT Treatments $Gait Training: 8-22 mins        Sabra Sessler W,PT Acute Rehabilitation Services Pager:  [phone]  Office:  [phone]    Berline Lopes 11/16/2020, 10:52 AM

## 2020-11-16 NOTE — Anesthesia Procedure Notes (Signed)
Procedure Name: Intubation Date/Time: 11/16/2020 12:04 PM Performed by: Nils Pyle, CRNA Pre-anesthesia Checklist: Patient identified, Emergency Drugs available, Suction available and Patient being monitored Patient Re-evaluated:Patient Re-evaluated prior to induction Oxygen Delivery Method: Circle System Utilized Preoxygenation: Pre-oxygenation with 100% oxygen Induction Type: IV induction Ventilation: Mask ventilation without difficulty and Oral airway inserted - appropriate to patient size Laryngoscope Size: Hyacinth Meeker and 2 Grade View: Grade I Tube type: Oral Tube size: 7.5 mm Number of attempts: 1 Airway Equipment and Method: Stylet and Oral airway Placement Confirmation: ETT inserted through vocal cords under direct vision,  positive ETCO2 and breath sounds checked- equal and bilateral Secured at: 23 cm Tube secured with: Tape Dental Injury: Teeth and Oropharynx as per pre-operative assessment

## 2020-11-16 NOTE — Op Note (Signed)
OPERATIVE NOTE  Patient Name:  Kevin Underwood Date of Birth:   1961-02-09 Medical Record Number: [id number]  DATE OF SURGERY:  11/16/2020  SURGEON: Alba Kriesel B. Chales Salmon, DMD  ASSISTANT: Pearletha Alfred, DAII  PREOPERATIVE DIAGNOSES: Patient Active Problem List   Diagnosis Date Noted  . Palliative care by specialist   . Goals of care, counseling/discussion   . Hyperlipidemia   . CHF exacerbation (HCC) 09/23/2020  . Unstable angina (HCC) 09/22/2020  . Chest tube in place   . 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 Cardiogenic shock (HCC)   . Acute respiratory failure (HCC)   . RUQ abdominal pain   . Cardiomyopathy (HCC) 11/21/2019  . Acute systolic CHF (congestive heart failure) (HCC) 11/21/2019  . History of alcohol abuse Quit 6 mos ago 11/21/2019  . History of recreational drug use 11/21/2019  . PAF (paroxysmal atrial fibrillation) (HCC) 11/21/2019  . Cardiac volume overload 11/21/2019  . Elevated brain natriuretic peptide (BNP) level 11/21/2019  . Leg edema, right 11/21/2019  . Hyponatremia 11/21/2019  . Abnormal electrocardiogram (ECG) (EKG) 11/21/2019  . Hyperglycemia 11/21/2019  . Tremulousness 11/21/2019  . Restlessness and agitation 11/21/2019  . OSA (obstructive sleep apnea) 11/21/2019  . Atrial fibrillation with RVR (HCC) 11/20/2019  . Acute congestive heart failure (HCC) 11/20/2019  . Acute respiratory distress 11/20/2019  . COPD with acute exacerbation (HCC)   . Hypertension   . Type 2 diabetes mellitus (HCC)   . Closed fracture of 5th metacarpal 08/31/2012   Dental caries, periodontal disease  POSTOPERATIVE DIAGNOSES: Same  PROCEDURES PERFORMED: 1. Extractions of teeth numbers 6, 7, 8, 9, 10, 11, 12, 22, 23, 25, 27, 28 and 29 2. 1 Quadrant of alveoloplasty (LLQ)  ANESTHESIA: General anesthesia via endotracheal tube.  MEDICATIONS: 1. Ancef 2 g IV prior to invasive dental procedures. 2. Local anesthesia with a total utilization of 2 cartridges of 34 mg of  lidocaine with 0.018 mg of epinephrine/ea as well as 1 cartridge with 9 mg of bupivacaine with 0.009 mg of epinephrine/ea.  SPECIMENS: 13 teeth that were extracted and discarded.  DRAINS/CULTURES: None  COMPLICATIONS: None  ESTIMATED BLOOD LOSS: 5 mL  INTRAVENOUS FLUIDS: 10 mL of Lactated ringers solution.  INDICATIONS: The patient was recently diagnosed with cardiogenic shock and exacerbated severe CHF.  A medically necessary dental consult was then requested to evaluate the patient's dentition for any signs of acute or chronic infection that may increase his risk of perioperative and postoperative complications or systemic infection.  The patient was examined and treatment planned for extractions of his remaining dentition with alveoloplasty as needed.  This treatment plan was formulated to decrease the risks and complications associated with dental infection from affecting the patient's systemic health perioperatively and postoperatively.  OPERATIVE FINDINGS: The patient was examined in operating room number 12.  The indicated teeth were identified for extraction. The patient was noted be affected by chronic periodontitis, severe dental decay and chronic apical periodontitis.  DESCRIPTION OF PROCEDURE: Patient was identified in the holding area and brought to the main operating room number 12 by the anesthesia team. Patient was then placed in the supine position on the operating table. General anesthesia was then induced per the anesthesia team. The patient was then prepped and draped in the usual sterile fashion for dental medicine procedures. A timeout was performed. The patient was identified and procedures were verified. A throat pack was placed at this time. The oral cavity was then thoroughly examined with the findings noted above.  The patient was then ready for dental medicine procedure as follows:  ROUTINE EXTRACTIONS: Local anesthesia was administered sequentially with a total  utilization of 2 cartridges each containing 34 mg of lidocaine with 0.018 mg of epinephrine as well as 1 cartridge each containing 9 mg bupivacaine with 0.009 mg of epinephrine. Location of anesthesia included infiltration of Maxillary and Mandibular infiltration, palatal and lingual injections.  The Maxillary left and right quadrants first approached. The teeth were then subluxated with a series of straight elevators. Tooth numbers 6, 7, 8, 9, 10, 11 and 12 were then removed with a 150 forceps without complications. The surgical sites were then irrigated with copious amounts of sterile saline. The tissues were approximated and trimmed appropriately. Surgi Foam was coated with Amicar and placed in each extraction site. The surgical sites were all closed using 3-0 chromic gut sutures as follows: Continuous interlocking style suture from the upper left to the upper right.  The Mandibular left and right quadrants were then approached. The teeth were subluxated with a series of straight elevators. Tooth numbers 22, 23, 25, 27, 28 and 29 were then removed utilizing a 151 forcep. Alveoloplasty was then performed utilizing a rongeurs and bone file on the lower left quadrant to help achieve primary closure. The tissues were approximated and trimmed appropriately. The surgical sites were then irrigated with copious amounts of sterile saline. Surgi Foam coated with Amicar was placed in each extraction site. The surgical sites were all closed using 3-0 chromic gut sutures as follows: LLQ figure-8 style suture and 1 simple interrupted, #25 extraction site 2 simple interrupted sutures, LRQ 1 figure-8 style suture.  END OF PROCEDURE: Thorough oral irrigation with sterile saline was performed. The patient was examined for complications, seeing none, the dental medicine procedure was deemed to be complete. The throat pack was removed at this time. An oral airway was then placed at the request of the anesthesia team. A series  of 4 x 4 gauze were placed in the mouth to aid hemostasis as needed. The patient was then handed over to the anesthesia team for final disposition. After an appropriate amount of time, the patient was extubated and taken to the postanesthsia care unit in stable condition. All counts were correct for the dental medicine procedure.   Arieanna Pressey B. Chales Salmon, DMD

## 2020-11-16 NOTE — Progress Notes (Signed)
ANTICOAGULATION CONSULT NOTE   Pharmacy Consult for Heparin Indication: atrial fibrillation, impella  No Known Allergies  Patient Measurements: Height: 5\' 10"  (177.8 cm) Weight: 78.5 kg (173 lb 1 oz) IBW/kg (Calculated) : 73 Heparin Dosing Weight: 82kg  Vital Signs: Temp: 97.8 F (36.6 C) (12/09 0800) Temp Source: Oral (12/09 0800) BP: 129/90 (12/09 1000) Pulse Rate: 78 (12/09 1000)  Labs: Recent Labs    11/14/20 0343 11/15/20 0515 11/16/20 0512  HGB 11.5* 11.5* 11.4*  HCT 34.5* 33.0* 35.1*  PLT 129* 141* 155  HEPARINUNFRC <0.10* <0.10* 0.15*  CREATININE 0.95 0.75 0.87  0.88    Estimated Creatinine Clearance: 93.3 mL/min (by C-G formula based on SCr of 0.88 mg/dL).   Medical History: Past Medical History:  Diagnosis Date  . Arthritis   . CAD (coronary artery disease)    a. s/p CABG in 11/2019 with LIMA-LAD, SVG-OM1, SVG-PDA and SVG-D1  . CHF (congestive heart failure) (HCC)    a. EF < 20% by echo in 11/2019  . COPD (chronic obstructive pulmonary disease) (HCC)   . Essential hypertension   . PAF (paroxysmal atrial fibrillation) (HCC)   . Type 2 diabetes mellitus (HCC)      Assessment: 38 yoM admitted with CP and cardiogenic shock. Pt has hx AFib with no AC PTA, pharmacy to start IV heparin.   Impella 5.5 placed 12/3.  Heparin level therapeutic 12/5 but then bleeding at insertion site  so heparin drip decreased with plan to keep HL < 0.3  Heparin level this AM = 0.15.  IV heparin systemically at 750units/hr with no titration for now per MD+ heparin in purge solution at 12.2 ml/hr = 610 units/hr.  H/h and pltc stable LDH rising slowly.  Heparin was turned off for OR early this AM.  Now back from OR for teeth extraction- discussed with Drs. 14/5 and [place], will plan to resume heparin 8 hrs after procedure (completed 1230 pm).  Goal of Therapy:  Heparin level < 0.3 12/5 pm Monitor platelets by anticoagulation protocol: Yes   Plan:  Heparin in  purge left running at 11.1 ml/hr = 555 units/hr of heparin. Will resume systemic heparin at 2100 PM at previous rate of 750 units/hr. Check heparin level 6 hrs after gtt resumed.  Marlette, Reece Leader, BCCP Clinical Pharmacist  11/16/2020 3:07 PM   [facility] pharmacy phone numbers are listed on [email or web address]

## 2020-11-16 NOTE — Progress Notes (Signed)
Patient ID: Kevin Underwood, male   DOB: 1961-05-14, 59 y.o.   MRN: [id number]     Advanced Heart Failure Rounding Note  PCP-Cardiologist: Kevin Dell, MD   Subjective:    - 12/2 Had cath as noted below with intact revascularization. CO-OX 39%, started on milrinone.  - 12/3 Impella 5.5 placed - 12/6 Impella speed increased to P8. Started on lasix drip and given metolazone. Brisk diuresis noted. Negative 5 liters.  - 12/8 Swan removed, PICC placed pre-LVAD.   Remain on milrinone 0.375 mcg + amio drip. Co-ox drawn early am lower at 53%.  CVP 6-7.  I/Os negative.    Hematoma at Impella site on CTA 12/5 without active bleeding. Hgb stable 11.4.   Minimal pain at Impella site.   Impella 5.5 P-8 Flow 3.9-4 L/min LDH 190 => 214=>239 =>260 => 291  RHC/LHC 11/09/20  RA 20  PCWP 30-35 PA Sat 40% CO 3 CI 1.5.  1. Severe native coronary artery disease, similar to prior catheterization in 11/2019.  2. Widely patent LIMA-LAD, SVG-D1, and sequential SVG-OM3-rPDA. 3. Severely elevated left and right heart filling pressures. 4. Severely reduced cardiac output/index. 5. Successful placement of leave-in PA catheter via the right internal jugular vein.   Objective:   Weight Range: 78.5 kg Body mass index is 24.83 kg/m.   Vital Signs:   Temp:  [97.1 F (36.2 C)-98.4 F (36.9 C)] 97.7 F (36.5 C) (12/09 0627) Pulse Rate:  [71-84] 77 (12/09 0700) Resp:  [11-25] 21 (12/09 0700) BP: (100-130)/(75-95) 117/88 (12/09 0700) SpO2:  [93 %-100 %] 98 % (12/09 0755) Arterial Line BP: (109-144)/(67-90) 109/68 (12/08 1200) Weight:  [78.5 kg] 78.5 kg (12/09 0627) Last BM Date: 11/11/20  Weight change: Filed Weights   11/14/20 0545 11/15/20 0630 11/16/20 0627  Weight: 79 kg 79.2 kg 78.5 kg    Intake/Output:   Intake/Output Summary (Last 24 hours) at 11/16/2020 0814 Last data filed at 11/16/2020 0700 Gross per 24 hour  Intake 1579.79 ml  Output 4775 ml  Net -3195.21 ml       Physical Exam  CVP 6-7 General: NAD Neck: No JVD, no thyromegaly or thyroid nodule.  Lungs: Clear to auscultation bilaterally with normal respiratory effort. CV: Lateral PMI.  Heart regular S1/S2, no S3/S4, no murmur.  No peripheral edema.   Abdomen: Soft, nontender, no hepatosplenomegaly, no distention.  Skin: Intact without lesions or rashes.  Neurologic: Alert and oriented x 3.  Psych: Normal affect. Extremities: No clubbing or cyanosis.  HEENT: Normal.    Telemetry   NSR 70s    Labs    CBC Recent Labs    11/15/20 0515 11/16/20 0512  WBC 9.7 10.2  HGB 11.5* 11.4*  HCT 33.0* 35.1*  MCV 83.5 86.5  PLT 141* 155   Basic Metabolic Panel Recent Labs    56/38/75 0515 11/16/20 0512  NA 126* 127*  K 3.9 3.8  CL 87* 88*  CO2 29 27  GLUCOSE 299* 395*  BUN 20 18  CREATININE 0.75 0.87  0.88  CALCIUM 8.7* 8.8*   Liver Function Tests No results for input(s): AST, ALT, ALKPHOS, BILITOT, PROT, ALBUMIN in the last 72 hours. No results for input(s): LIPASE, AMYLASE in the last 72 hours. Cardiac Enzymes No results for input(s): CKTOTAL, CKMB, CKMBINDEX, TROPONINI in the last 72 hours.  BNP: BNP (last 3 results) Recent Labs    11/25/19 0500 09/22/20 0348 11/08/20 1344  BNP 596.1* 939.0* 910.0*    ProBNP (last 3 results) No  results for input(s): PROBNP in the last 8760 hours.   D-Dimer No results for input(s): DDIMER in the last 72 hours. Hemoglobin A1C No results for input(s): HGBA1C in the last 72 hours. Fasting Lipid Panel No results for input(s): CHOL, HDL, LDLCALC, TRIG, CHOLHDL, LDLDIRECT in the last 72 hours. Thyroid Function Tests No results for input(s): TSH, T4TOTAL, T3FREE, THYROIDAB in the last 72 hours.  Invalid input(s): FREET3  Other results:   Imaging    DG CHEST PORT 1 VIEW  Result Date: 11/16/2020 CLINICAL DATA:  Chest pain.  Left ventricular assist device. EXAM: PORTABLE CHEST 1 VIEW COMPARISON:  CT 11/12/2020.  Chest  x-ray 11/09/2020. FINDINGS: Right PICC line noted with tip over SVC. Impella device in stable position. Prior median sternotomy. Fractured sternotomy with wire noted, unchanged. Left atrial appendage clip in stable position. Stable cardiomegaly. No pulmonary venous congestion. No focal infiltrate. No pleural effusion or pneumothorax. Surgical clips and staples noted over the chest. IMPRESSION: 1. Right PICC line noted with tip over SVC. Impella device in stable position. 2. Prior median sternotomy. Stable cardiomegaly. No pulmonary venous congestion. 3.  No acute pulmonary disease. Electronically Signed   By: Kevin Fus  Register   On: 11/16/2020 08:08   ECHOCARDIOGRAM LIMITED  Result Date: 11/15/2020    ECHOCARDIOGRAM LIMITED REPORT   Patient Name:   Kevin Underwood Date of Exam: 11/15/2020 Medical Rec #:  [id number]        Height:       70.0 in Accession #:    [id number]       Weight:       174.6 lb Date of Birth:  Jan 19, 1961        BSA:          1.970 m Patient Age:    59 years         BP:           120/89 mmHg Patient Gender: M                HR:           84 bpm. Exam Location:  Inpatient Procedure: Limited Echo and Limited Color Doppler Indications:    CHF-Acute Systolic 428.21 / I50.21  History:        Patient has prior history of Echocardiogram examinations, most                 recent 11/13/2020. CAD, Arrythmias:Atrial Fibrillation; Risk                 Factors:Hypertension, Dyslipidemia and Current Smoker. Impella                 placement.  Sonographer:    Kevin Underwood RDCS Referring Phys: [phone] Kevin Underwood IMPRESSIONS  1. Limited echo for impella positioning. Impella appears deep in LV, measures up to 5.6cm from aortic valve.  2. Left ventricular ejection fraction, by estimation, is <20%. The left ventricle has severely decreased function. The left ventricle demonstrates global hypokinesis. The left ventricular internal cavity size was mildly dilated.  3. Right ventricular systolic function is  moderately reduced. The right ventricular size is mildly enlarged. FINDINGS  Left Ventricle: Left ventricular ejection fraction, by estimation, is <20%. The left ventricle has severely decreased function. The left ventricle demonstrates global hypokinesis. The left ventricular internal cavity size was mildly dilated. Right Ventricle: The right ventricular size is mildly enlarged. Right ventricular systolic function is moderately reduced. Additional Comments: Limited echo for impella positioning.  Impella appears deep in LV, measures up to 5.6cm from aortic valve. Christopher Schumann MD Electronically signed by Christopher Schumann MD Signature Date/Time: 11/15/2020/3:58:15 PM    Final      Medications:     Scheduled Medications: . acetaminophen  1,000 mg Oral Once  . aspirin EC  81 mg Oral Daily  . chlorhexidine  15 mL Mouth/Throat BID  . Chlorhexidine Gluconate Cloth  6 each Topical Daily  . digoxin  0.125 mg Oral Daily  . docusate sodium  100 mg Oral Daily  . feeding supplement (GLUCERNA SHAKE)  237 mL Oral TID BM  . gabapentin  600 mg Oral TID  . insulin aspart  0-15 Units Subcutaneous TID WC  . insulin aspart  0-5 Units Subcutaneous QHS  . insulin aspart  6 Units Subcutaneous TID WC  . insulin glargine  15 Units Subcutaneous BID  . magnesium oxide  400 mg Oral Daily  . mouth rinse  15 mL Mouth Rinse BID  . mometasone-formoterol  2 puff Inhalation BID  . multivitamin with minerals  1 tablet Oral Daily  . mupirocin ointment  1 application Nasal BID  . pantoprazole  40 mg Oral Daily  . potassium chloride  40 mEq Oral Once  . rosuvastatin  40 mg Oral q1800  . sodium chloride flush  10-40 mL Intracatheter Q12H  . sodium chloride flush  10-40 mL Intracatheter Q12H  . sodium chloride flush  3 mL Intravenous Q12H  . sodium chloride flush  3 mL Intravenous Q12H  . spironolactone  12.5 mg Oral Daily  . tolvaptan  15 mg Oral Once  . traZODone  50 mg Oral QHS    Infusions: . sodium  chloride Stopped (11/15/20 1148)  . amiodarone 30 mg/hr (11/16/20 0500)  .  ceFAZolin (ANCEF) IV    . impella catheter heparin 50 unit/mL in dextrose 5%    . milrinone 0.375 mcg/kg/min (11/16/20 0514)  . nitroGLYCERIN Stopped (11/12/20 1041)  . norepinephrine (LEVOPHED) Adult infusion    . vasopressin      PRN Medications: sodium chloride, acetaminophen, albuterol, LORazepam, morphine injection, ondansetron (ZOFRAN) IV, oxyCODONE-acetaminophen, oxyCODONE-acetaminophen, sodium chloride flush, sodium chloride flush, sodium chloride flush    Patient Profile   Kevin Underwoodis a 59 y.o.malewith a history of systolic HF, 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 type 2 diabetes mellitus, COPD, and hypertension.   Assessment/Plan   1. Acute on chronic systolic CHF with cardiogenic shock: Echo 10/21 with EF 20-25%, mildly decreased RV function (persistently low). LHC/RHC this admission with patent grafts, low output. Suspect mixed ischemic/nonischemic cardiomyopathy (prior heavy ETOH and drugs as well as CAD).  No ETOH, drugs, smoking for about 1 year since CABG in 12/20. NYHA class IV at admission.  Now s/p placement of Impella 5.5.  Hematoma at site, CT 12/5 no active bleeding and hgb now stable.  Stable LDH (slight rise).  CVP 6-7 today with early am co-ox lower at 53%. Swan out pre-surgery and has PICC.   - Continue impella P8.  Check position by limited echo today (adjusted yesterday). Will resend co-ox.   - Hold heparin gtt this morning for dental extractions, restart post-procedure.  - Continue milrinone 0.375.  - Continue digoxin 0.125 daily and spironolactone 12.5 daily.  - No Lasix today.  - Will have dental extractions today, LVAD placement tomorrow.    2. CAD: S/p   CABG 12/20.  LHC this admission with patent grafts, no target for intervention.   - Continue ASA and statin.  3. Atrial fibrillation: Paroxysmal.  S/p Maze and LA appendage clip with CABG in 12/20.  In NSR with PACs currently.  - Continue amiodarone gtt.  - Continue heparin gtt (hold transiently for dental extractions).  4. Type 2 diabetes: A1c 10.4, poor control.  Continue insulin.  5. Prior ETOH, drugs: None x 1 year.  6. Thrombocytopenia: Mildly decreased.  Likely due to low level hemolysis. Plts trending up.  7. Hyponatremia: Na 127.  - Fluid restrict.  - Will give dose of tolvaptan 15 mg x 1.  8. COPD: PFTs showed FEV1 1.79 (limited study due to LVAD).   Mobilize  CRITICAL CARE Performed by: Marca Ancona  Total critical care time: 40 minutes  Critical care time was exclusive of separately billable procedures and treating other patients.  Critical care was necessary to treat or prevent imminent or life-threatening deterioration.  Critical care was time spent personally by me on the following activities: development of treatment plan with patient and/or surrogate as well as nursing, discussions with consultants, evaluation of patient's response to treatment, examination of patient, obtaining history from patient or surrogate, ordering and performing treatments and interventions, ordering and review of laboratory studies, ordering and review of radiographic studies, pulse oximetry and re-evaluation of patient's condition.    Length of Stay: 8  Marca Ancona, MD  11/16/2020, 8:14 AM  Advanced Heart Failure Team Pager [phone] (M-F; 7a - 4p)  Please contact [facility] for night-coverage after hours (4p -7a ) and weekends on [email or web address]

## 2020-11-16 NOTE — Progress Notes (Signed)
  Echocardiogram 2D Echocardiogram has been performed.  Kevin Underwood 11/16/2020, 10:31 AM

## 2020-11-16 NOTE — Transfer of Care (Signed)
Immediate Anesthesia Transfer of Care Note  Patient: Kevin Underwood  Procedure(s) Performed: MULTIPLE EXTRACTION WITH ALVEOLOPLASTY (N/A )  Patient Location: PACU  Anesthesia Type:General  Level of Consciousness: awake, alert  and oriented  Airway & Oxygen Therapy: Patient Spontanous Breathing  Post-op Assessment: Report given to RN and Post -op Vital signs reviewed and stable  Post vital signs: Reviewed and stable  Last Vitals:  Vitals Value Taken Time  BP 122/89 11/16/20 1337  Temp    Pulse 80 11/16/20 1338  Resp 16 11/16/20 1338  SpO2 97 % 11/16/20 1338  Vitals shown include unvalidated device data.  Last Pain:  Vitals:   11/16/20 0845  TempSrc:   PainSc: 0-No pain      Patients Stated Pain Goal: 2 (11/16/20 0845)  Complications: No complications documented.

## 2020-11-16 NOTE — Progress Notes (Signed)
Received order, unfortunately CR does not see pts on impella therefore will not be able to perform 6 min walk test. Ethelda Chick CES, ACSM 8:00 AM 11/16/2020

## 2020-11-16 NOTE — Interval H&P Note (Signed)
History and Physical Interval Note:  11/16/2020 11:28 AM  Kevin Underwood  has presented today for surgery, with the diagnosis of dental carries.  The various methods of treatment have been discussed with the patient and family. After consideration of risks, benefits and other options for treatment, the patient has consented to  Procedure(s): MULTIPLE EXTRACTION WITH ALVEOLOPLASTY (N/A) as a surgical intervention.  The patient's history has been reviewed, patient examined, no change in status, stable for surgery.  I have reviewed the patient's chart and labs.  Questions were answered to the patient's satisfaction.     Sharman Cheek

## 2020-11-16 NOTE — Progress Notes (Signed)
ANTICOAGULATION CONSULT NOTE   Pharmacy Consult for Heparin Indication: atrial fibrillation, impella  No Known Allergies  Patient Measurements: Height: 5\' 10"  (177.8 cm) Weight: 78.5 kg (173 lb 1 oz) IBW/kg (Calculated) : 73 Heparin Dosing Weight: 82kg  Vital Signs: Temp: 97.8 F (36.6 C) (12/09 0800) Temp Source: Oral (12/09 0800) BP: 129/90 (12/09 1000) Pulse Rate: 78 (12/09 1000)  Labs: Recent Labs    11/14/20 0343 11/15/20 0515 11/16/20 0512  HGB 11.5* 11.5* 11.4*  HCT 34.5* 33.0* 35.1*  PLT 129* 141* 155  HEPARINUNFRC <0.10* <0.10* 0.15*  CREATININE 0.95 0.75 0.87  0.88    Estimated Creatinine Clearance: 93.3 mL/min (by C-G formula based on SCr of 0.88 mg/dL).   Medical History: Past Medical History:  Diagnosis Date  . Arthritis   . CAD (coronary artery disease)    a. s/p CABG in 11/2019 with LIMA-LAD, SVG-OM1, SVG-PDA and SVG-D1  . CHF (congestive heart failure) (HCC)    a. EF < 20% by echo in 11/2019  . COPD (chronic obstructive pulmonary disease) (HCC)   . Essential hypertension   . PAF (paroxysmal atrial fibrillation) (HCC)   . Type 2 diabetes mellitus (HCC)      Assessment: 59 yoM admitted with CP and cardiogenic shock. Pt has hx AFib with no AC PTA, pharmacy to start IV heparin.   Impella 5.5 placed 12/3.  Heparin level therapeutic 12/5 but then bleeding at insertion site  so heparin drip decreased with plan to keep HL < 0.3  Heparin level this AM = 0.15.  IV heparin systemically at 750units/hr with no titration for now per MD+ heparin in purge solution at 12.2 ml/hr = 610 units/hr.  H/h and pltc stable LDH rising slowly.  Goal of Therapy:  Heparin level < 0.3 12/5 pm Monitor platelets by anticoagulation protocol: Yes   Plan:  Systemic heparin off this AM prior to OR Heparin in purge left running at 11.1 ml/hr = 555 units/hr of heparin. F/u plans to resume IV heparin tonight after returns from OR.  14/5, Reece Leader, BCPS,  [facility] Clinical Pharmacist  11/16/2020 1:25 PM   [facility] pharmacy phone numbers are listed on [email or web address]

## 2020-11-16 NOTE — H&P (View-Only) (Signed)
Patient ID: Kevin Underwood, male   DOB: 1961-05-14, 59 y.o.   MRN: [id number]     Advanced Heart Failure Rounding Note  PCP-Cardiologist: Nona Dell, MD   Subjective:    - 12/2 Had cath as noted below with intact revascularization. CO-OX 39%, started on milrinone.  - 12/3 Impella 5.5 placed - 12/6 Impella speed increased to P8. Started on lasix drip and given metolazone. Brisk diuresis noted. Negative 5 liters.  - 12/8 Swan removed, PICC placed pre-LVAD.   Remain on milrinone 0.375 mcg + amio drip. Co-ox drawn early am lower at 53%.  CVP 6-7.  I/Os negative.    Hematoma at Impella site on CTA 12/5 without active bleeding. Hgb stable 11.4.   Minimal pain at Impella site.   Impella 5.5 P-8 Flow 3.9-4 L/min LDH 190 => 214=>239 =>260 => 291  RHC/LHC 11/09/20  RA 20  PCWP 30-35 PA Sat 40% CO 3 CI 1.5.  1. Severe native coronary artery disease, similar to prior catheterization in 11/2019.  2. Widely patent LIMA-LAD, SVG-D1, and sequential SVG-OM3-rPDA. 3. Severely elevated left and right heart filling pressures. 4. Severely reduced cardiac output/index. 5. Successful placement of leave-in PA catheter via the right internal jugular vein.   Objective:   Weight Range: 78.5 kg Body mass index is 24.83 kg/m.   Vital Signs:   Temp:  [97.1 F (36.2 C)-98.4 F (36.9 C)] 97.7 F (36.5 C) (12/09 0627) Pulse Rate:  [71-84] 77 (12/09 0700) Resp:  [11-25] 21 (12/09 0700) BP: (100-130)/(75-95) 117/88 (12/09 0700) SpO2:  [93 %-100 %] 98 % (12/09 0755) Arterial Line BP: (109-144)/(67-90) 109/68 (12/08 1200) Weight:  [78.5 kg] 78.5 kg (12/09 0627) Last BM Date: 11/11/20  Weight change: Filed Weights   11/14/20 0545 11/15/20 0630 11/16/20 0627  Weight: 79 kg 79.2 kg 78.5 kg    Intake/Output:   Intake/Output Summary (Last 24 hours) at 11/16/2020 0814 Last data filed at 11/16/2020 0700 Gross per 24 hour  Intake 1579.79 ml  Output 4775 ml  Net -3195.21 ml       Physical Exam  CVP 6-7 General: NAD Neck: No JVD, no thyromegaly or thyroid nodule.  Lungs: Clear to auscultation bilaterally with normal respiratory effort. CV: Lateral PMI.  Heart regular S1/S2, no S3/S4, no murmur.  No peripheral edema.   Abdomen: Soft, nontender, no hepatosplenomegaly, no distention.  Skin: Intact without lesions or rashes.  Neurologic: Alert and oriented x 3.  Psych: Normal affect. Extremities: No clubbing or cyanosis.  HEENT: Normal.    Telemetry   NSR 70s    Labs    CBC Recent Labs    11/15/20 0515 11/16/20 0512  WBC 9.7 10.2  HGB 11.5* 11.4*  HCT 33.0* 35.1*  MCV 83.5 86.5  PLT 141* 155   Basic Metabolic Panel Recent Labs    56/38/75 0515 11/16/20 0512  NA 126* 127*  K 3.9 3.8  CL 87* 88*  CO2 29 27  GLUCOSE 299* 395*  BUN 20 18  CREATININE 0.75 0.87  0.88  CALCIUM 8.7* 8.8*   Liver Function Tests No results for input(s): AST, ALT, ALKPHOS, BILITOT, PROT, ALBUMIN in the last 72 hours. No results for input(s): LIPASE, AMYLASE in the last 72 hours. Cardiac Enzymes No results for input(s): CKTOTAL, CKMB, CKMBINDEX, TROPONINI in the last 72 hours.  BNP: BNP (last 3 results) Recent Labs    11/25/19 0500 09/22/20 0348 11/08/20 1344  BNP 596.1* 939.0* 910.0*    ProBNP (last 3 results) No  results for input(s): PROBNP in the last 8760 hours.   D-Dimer No results for input(s): DDIMER in the last 72 hours. Hemoglobin A1C No results for input(s): HGBA1C in the last 72 hours. Fasting Lipid Panel No results for input(s): CHOL, HDL, LDLCALC, TRIG, CHOLHDL, LDLDIRECT in the last 72 hours. Thyroid Function Tests No results for input(s): TSH, T4TOTAL, T3FREE, THYROIDAB in the last 72 hours.  Invalid input(s): FREET3  Other results:   Imaging    DG CHEST PORT 1 VIEW  Result Date: 11/16/2020 CLINICAL DATA:  Chest pain.  Left ventricular assist device. EXAM: PORTABLE CHEST 1 VIEW COMPARISON:  CT 11/12/2020.  Chest  x-ray 11/09/2020. FINDINGS: Right PICC line noted with tip over SVC. Impella device in stable position. Prior median sternotomy. Fractured sternotomy with wire noted, unchanged. Left atrial appendage clip in stable position. Stable cardiomegaly. No pulmonary venous congestion. No focal infiltrate. No pleural effusion or pneumothorax. Surgical clips and staples noted over the chest. IMPRESSION: 1. Right PICC line noted with tip over SVC. Impella device in stable position. 2. Prior median sternotomy. Stable cardiomegaly. No pulmonary venous congestion. 3.  No acute pulmonary disease. Electronically Signed   By: Maisie Fus  Register   On: 11/16/2020 08:08   ECHOCARDIOGRAM LIMITED  Result Date: 11/15/2020    ECHOCARDIOGRAM LIMITED REPORT   Patient Name:   Kevin Underwood Date of Exam: 11/15/2020 Medical Rec #:  [id number]        Height:       70.0 in Accession #:    [id number]       Weight:       174.6 lb Date of Birth:  Jan 19, 1961        BSA:          1.970 m Patient Age:    59 years         BP:           120/89 mmHg Patient Gender: M                HR:           84 bpm. Exam Location:  Inpatient Procedure: Limited Echo and Limited Color Doppler Indications:    CHF-Acute Systolic 428.21 / I50.21  History:        Patient has prior history of Echocardiogram examinations, most                 recent 11/13/2020. CAD, Arrythmias:Atrial Fibrillation; Risk                 Factors:Hypertension, Dyslipidemia and Current Smoker. Impella                 placement.  Sonographer:    Leta Jungling RDCS Referring Phys: [phone] Cayne Yom S Kerolos Nehme IMPRESSIONS  1. Limited echo for impella positioning. Impella appears deep in LV, measures up to 5.6cm from aortic valve.  2. Left ventricular ejection fraction, by estimation, is <20%. The left ventricle has severely decreased function. The left ventricle demonstrates global hypokinesis. The left ventricular internal cavity size was mildly dilated.  3. Right ventricular systolic function is  moderately reduced. The right ventricular size is mildly enlarged. FINDINGS  Left Ventricle: Left ventricular ejection fraction, by estimation, is <20%. The left ventricle has severely decreased function. The left ventricle demonstrates global hypokinesis. The left ventricular internal cavity size was mildly dilated. Right Ventricle: The right ventricular size is mildly enlarged. Right ventricular systolic function is moderately reduced. Additional Comments: Limited echo for impella positioning.  Impella appears deep in LV, measures up to 5.6cm from aortic valve. Epifanio Lesches MD Electronically signed by Epifanio Lesches MD Signature Date/Time: 11/15/2020/3:58:15 PM    Final      Medications:     Scheduled Medications: . acetaminophen  1,000 mg Oral Once  . aspirin EC  81 mg Oral Daily  . chlorhexidine  15 mL Mouth/Throat BID  . Chlorhexidine Gluconate Cloth  6 each Topical Daily  . digoxin  0.125 mg Oral Daily  . docusate sodium  100 mg Oral Daily  . feeding supplement (GLUCERNA SHAKE)  237 mL Oral TID BM  . gabapentin  600 mg Oral TID  . insulin aspart  0-15 Units Subcutaneous TID WC  . insulin aspart  0-5 Units Subcutaneous QHS  . insulin aspart  6 Units Subcutaneous TID WC  . insulin glargine  15 Units Subcutaneous BID  . magnesium oxide  400 mg Oral Daily  . mouth rinse  15 mL Mouth Rinse BID  . mometasone-formoterol  2 puff Inhalation BID  . multivitamin with minerals  1 tablet Oral Daily  . mupirocin ointment  1 application Nasal BID  . pantoprazole  40 mg Oral Daily  . potassium chloride  40 mEq Oral Once  . rosuvastatin  40 mg Oral q1800  . sodium chloride flush  10-40 mL Intracatheter Q12H  . sodium chloride flush  10-40 mL Intracatheter Q12H  . sodium chloride flush  3 mL Intravenous Q12H  . sodium chloride flush  3 mL Intravenous Q12H  . spironolactone  12.5 mg Oral Daily  . tolvaptan  15 mg Oral Once  . traZODone  50 mg Oral QHS    Infusions: . sodium  chloride Stopped (11/15/20 1148)  . amiodarone 30 mg/hr (11/16/20 0500)  .  ceFAZolin (ANCEF) IV    . impella catheter heparin 50 unit/mL in dextrose 5%    . milrinone 0.375 mcg/kg/min (11/16/20 0514)  . nitroGLYCERIN Stopped (11/12/20 1041)  . norepinephrine (LEVOPHED) Adult infusion    . vasopressin      PRN Medications: sodium chloride, acetaminophen, albuterol, LORazepam, morphine injection, ondansetron (ZOFRAN) IV, oxyCODONE-acetaminophen, oxyCODONE-acetaminophen, sodium chloride flush, sodium chloride flush, sodium chloride flush    Patient Profile   ZEE VILL a 58 y.o.malewith a history of systolic HF, 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 type 2 diabetes mellitus, COPD, and hypertension.   Assessment/Plan   1. Acute on chronic systolic CHF with cardiogenic shock: Echo 10/21 with EF 20-25%, mildly decreased RV function (persistently low). LHC/RHC this admission with patent grafts, low output. Suspect mixed ischemic/nonischemic cardiomyopathy (prior heavy ETOH and drugs as well as CAD).  No ETOH, drugs, smoking for about 1 year since CABG in 12/20. NYHA class IV at admission.  Now s/p placement of Impella 5.5.  Hematoma at site, CT 12/5 no active bleeding and hgb now stable.  Stable LDH (slight rise).  CVP 6-7 today with early am co-ox lower at 53%. Ernestine Conrad out pre-surgery and has PICC.   - Continue impella P8.  Check position by limited echo today (adjusted yesterday). Will resend co-ox.   - Hold heparin gtt this morning for dental extractions, restart post-procedure.  - Continue milrinone 0.375.  - Continue digoxin 0.125 daily and spironolactone 12.5 daily.  - No Lasix today.  - Will have dental extractions today, LVAD placement tomorrow.    2. CAD: S/p  CABG 12/20.  LHC this admission with patent grafts, no target for intervention.   - Continue ASA and statin.  3. Atrial fibrillation: Paroxysmal.  S/p Maze and LA appendage clip with CABG in 12/20.  In NSR with PACs currently.  - Continue amiodarone gtt.  - Continue heparin gtt (hold transiently for dental extractions).  4. Type 2 diabetes: A1c 10.4, poor control.  Continue insulin.  5. Prior ETOH, drugs: None x 1 year.  6. Thrombocytopenia: Mildly decreased.  Likely due to low level hemolysis. Plts trending up.  7. Hyponatremia: Na 127.  - Fluid restrict.  - Will give dose of tolvaptan 15 mg x 1.  8. COPD: PFTs showed FEV1 1.79 (limited study due to LVAD).   Mobilize  CRITICAL CARE Performed by: Marca Ancona  Total critical care time: 40 minutes  Critical care time was exclusive of separately billable procedures and treating other patients.  Critical care was necessary to treat or prevent imminent or life-threatening deterioration.  Critical care was time spent personally by me on the following activities: development of treatment plan with patient and/or surrogate as well as nursing, discussions with consultants, evaluation of patient's response to treatment, examination of patient, obtaining history from patient or surrogate, ordering and performing treatments and interventions, ordering and review of laboratory studies, ordering and review of radiographic studies, pulse oximetry and re-evaluation of patient's condition.    Length of Stay: 8  Marca Ancona, MD  11/16/2020, 8:14 AM  Advanced Heart Failure Team Pager [phone] (M-F; 7a - 4p)  Please contact [facility] for night-coverage after hours (4p -7a ) and weekends on [email or web address]

## 2020-11-16 NOTE — Progress Notes (Signed)
PRE-OPERATIVE NOTE  11/16/2020 Phoebe Sharps [id number]  VITALS: BP 129/90   Pulse 78   Temp 97.8 F (36.6 C) (Oral)   Resp 13   Ht 5\' 10"  (1.778 m)   Wt 78.5 kg   SpO2 99%   BMI 24.83 kg/m   Lab Results  Component Value Date   WBC 10.2 11/16/2020   HGB 11.4 (L) 11/16/2020   HCT 35.1 (L) 11/16/2020   MCV 86.5 11/16/2020   PLT 155 11/16/2020   BMET    Component Value Date/Time   NA 127 (L) 11/16/2020 0512   K 3.8 11/16/2020 0512   CL 88 (L) 11/16/2020 0512   CO2 27 11/16/2020 0512   GLUCOSE 395 (H) 11/16/2020 0512   BUN 18 11/16/2020 0512   CREATININE 0.88 11/16/2020 0512   CREATININE 0.87 11/16/2020 0512   CALCIUM 8.8 (L) 11/16/2020 0512   GFRNONAA >60 11/16/2020 0512   GFRNONAA >60 11/16/2020 0512   GFRAA >60 02/02/2020 1258    Lab Results  Component Value Date   INR 1.1 11/10/2020   INR 1.4 (H) 11/26/2019   No results found for: PTT   11/28/2019 presents for dental procedures in the operating room.   SUBJECTIVE: The patient denies any acute medical or dental changes and agrees to proceed with treatment as planned.   EXAM: No sign of acute dental changes.   ASSESSMENT: Patient is affected by cardiogenic shock and exacerbated CHF anticipating LVAD implant placement on 12/10.  Patient has severe dental decay, chronic apical periodontitis and periodontal disease which puts him at risk for perioperative and postoperative complications and infection.   PLAN: Patient agrees to proceed with treatment as planned in the operating room as previously discussed.  The patient accepts the risks, benefits, and complications of the proposed treatment. Patient is aware of the risk for bleeding, bruising, swelling, infection, pain, nerve damage, sinus involvement, root tip fracture, mandible fracture, and the risks of complications associated with the anesthesia. Patient also is aware of the potential for other complications not mentioned above.   Tyne Banta  B. 14/10, DMD

## 2020-11-16 NOTE — Anesthesia Preprocedure Evaluation (Addendum)
Anesthesia Evaluation  Patient identified by MRN, date of birth, ID band Patient awake    Reviewed: Allergy & Precautions, NPO status , Patient's Chart, lab work & pertinent test results  Airway Mallampati: II  TM Distance: >3 FB Neck ROM: Full    Dental  (+) Poor Dentition, Chipped, Missing, Loose, Dental Advisory Given   Pulmonary sleep apnea , COPD, former smoker,    breath sounds clear to auscultation       Cardiovascular hypertension, + angina + CAD, + CABG ( s/p CABG in 11/2019 with LIMA-LAD, SVG-OM1, SVG-PDA and SVG-D1) and +CHF  + dysrhythmias Atrial Fibrillation  Rhythm:Regular Rate:Normal  Acute on chronic systolic CHF with cardiogenic shock: Echo 10/21 with EF 20-25%, mildly decreased RV function (persistently low). LHC/RHC this admission with patent grafts, low output. Ischemic/nonischemic cardiomyopathy (prior heavy ETOH and drugs as well as CAD).  No ETOH, drugs, smoking for about 1 year since CABG in 12/20. NYHA class IV at admission.  Now s/p placement of Impella 5.5.    Neuro/Psych negative neurological ROS     GI/Hepatic negative GI ROS, (+)     substance abuse  alcohol use,   Endo/Other  negative endocrine ROSdiabetes  Renal/GU negative Renal ROS     Musculoskeletal  (+) Arthritis ,   Abdominal   Peds  Hematology  (+) Blood dyscrasia, anemia ,   Anesthesia Other Findings   Reproductive/Obstetrics                           Anesthesia Physical Anesthesia Plan  ASA: IV  Anesthesia Plan: General   Post-op Pain Management:    Induction: Intravenous  PONV Risk Score and Plan: 2  Airway Management Planned: Oral ETT  Additional Equipment: Arterial line  Intra-op Plan:   Post-operative Plan: Possible Post-op intubation/ventilation  Informed Consent:   Plan Discussed with:   Anesthesia Plan Comments:         Anesthesia Quick Evaluation

## 2020-11-16 NOTE — H&P (Signed)
Anesthesia H&P Update: History and Physical Exam reviewed; patient is OK for planned anesthetic and procedure. ? ?

## 2020-11-16 NOTE — Progress Notes (Signed)
CSW met with patient at bedside. Patient reports he had his teeth out and is in good spirits. Patient states he is ready for surgery tomorrow and feels confident with recovery. He ambulated the hallway and states he is grateful for the support and assistance with his housing issue. CSW provided support and will continue to follow throughout hospitalization. Raquel Sarna, [place], [place]

## 2020-11-17 ENCOUNTER — Inpatient Hospital Stay (HOSPITAL_COMMUNITY): Payer: 59 | Admitting: Anesthesiology

## 2020-11-17 ENCOUNTER — Inpatient Hospital Stay (HOSPITAL_COMMUNITY): Payer: 59

## 2020-11-17 ENCOUNTER — Encounter (HOSPITAL_COMMUNITY): Payer: Self-pay | Admitting: Dentistry

## 2020-11-17 ENCOUNTER — Inpatient Hospital Stay (HOSPITAL_COMMUNITY)
Admission: EM | Disposition: A | Discharge status: Home / Self Care | Payer: Self-pay | Source: Home / Self Care | Attending: Cardiothoracic Surgery

## 2020-11-17 DIAGNOSIS — I255 Ischemic cardiomyopathy: Secondary | ICD-10-CM

## 2020-11-17 DIAGNOSIS — E440 Moderate protein-calorie malnutrition: Secondary | ICD-10-CM | POA: Insufficient documentation

## 2020-11-17 DIAGNOSIS — I5022 Chronic systolic (congestive) heart failure: Secondary | ICD-10-CM | POA: Diagnosis present

## 2020-11-17 DIAGNOSIS — I509 Heart failure, unspecified: Secondary | ICD-10-CM

## 2020-11-17 DIAGNOSIS — R570 Cardiogenic shock: Secondary | ICD-10-CM

## 2020-11-17 HISTORY — PX: INSERTION OF IMPLANTABLE LEFT VENTRICULAR ASSIST DEVICE: SHX5866

## 2020-11-17 HISTORY — PX: TEE WITHOUT CARDIOVERSION: SHX5443

## 2020-11-17 LAB — PLATELET COUNT: Platelets: 124 10*3/uL — ABNORMAL LOW (ref 150–400)

## 2020-11-17 LAB — POCT I-STAT 7, (LYTES, BLD GAS, ICA,H+H)
Acid-Base Excess: 2 mmol/L (ref 0.0–2.0)
Acid-Base Excess: 4 mmol/L — ABNORMAL HIGH (ref 0.0–2.0)
Acid-Base Excess: 5 mmol/L — ABNORMAL HIGH (ref 0.0–2.0)
Acid-Base Excess: 4 mmol/L — ABNORMAL HIGH (ref 0.0–2.0)
Acid-Base Excess: 6 mmol/L — ABNORMAL HIGH (ref 0.0–2.0)
Acid-Base Excess: 4 mmol/L — ABNORMAL HIGH (ref 0.0–2.0)
Bicarbonate: 27.7 mmol/L (ref 20.0–28.0)
Bicarbonate: 28.4 mmol/L — ABNORMAL HIGH (ref 20.0–28.0)
Bicarbonate: 28.9 mmol/L — ABNORMAL HIGH (ref 20.0–28.0)
Bicarbonate: 29.2 mmol/L — ABNORMAL HIGH (ref 20.0–28.0)
Bicarbonate: 31.8 mmol/L — ABNORMAL HIGH (ref 20.0–28.0)
Bicarbonate: 29.7 mmol/L — ABNORMAL HIGH (ref 20.0–28.0)
Calcium, Ion: 1.19 mmol/L (ref 1.15–1.40)
Calcium, Ion: 1.08 mmol/L — ABNORMAL LOW (ref 1.15–1.40)
Calcium, Ion: 1 mmol/L — ABNORMAL LOW (ref 1.15–1.40)
Calcium, Ion: 1.22 mmol/L (ref 1.15–1.40)
Calcium, Ion: 1.25 mmol/L (ref 1.15–1.40)
Calcium, Ion: 1.22 mmol/L (ref 1.15–1.40)
HCT: 32 % — ABNORMAL LOW (ref 39.0–52.0)
HCT: 25 % — ABNORMAL LOW (ref 39.0–52.0)
HCT: 24 % — ABNORMAL LOW (ref 39.0–52.0)
HCT: 25 % — ABNORMAL LOW (ref 39.0–52.0)
HCT: 24 % — ABNORMAL LOW (ref 39.0–52.0)
HCT: 24 % — ABNORMAL LOW (ref 39.0–52.0)
Hemoglobin: 10.9 g/dL — ABNORMAL LOW (ref 13.0–17.0)
Hemoglobin: 8.5 g/dL — ABNORMAL LOW (ref 13.0–17.0)
Hemoglobin: 8.2 g/dL — ABNORMAL LOW (ref 13.0–17.0)
Hemoglobin: 8.5 g/dL — ABNORMAL LOW (ref 13.0–17.0)
Hemoglobin: 8.2 g/dL — ABNORMAL LOW (ref 13.0–17.0)
Hemoglobin: 8.2 g/dL — ABNORMAL LOW (ref 13.0–17.0)
O2 Saturation: 100 %
O2 Saturation: 100 %
O2 Saturation: 100 %
O2 Saturation: 100 %
O2 Saturation: 99 %
O2 Saturation: 100 %
Patient temperature: 36.8
Patient temperature: 36.4
Potassium: 3.6 mmol/L (ref 3.5–5.1)
Potassium: 3.5 mmol/L (ref 3.5–5.1)
Potassium: 4.2 mmol/L (ref 3.5–5.1)
Potassium: 3.6 mmol/L (ref 3.5–5.1)
Potassium: 3.3 mmol/L — ABNORMAL LOW (ref 3.5–5.1)
Potassium: 4.1 mmol/L (ref 3.5–5.1)
Sodium: 134 mmol/L — ABNORMAL LOW (ref 135–145)
Sodium: 135 mmol/L (ref 135–145)
Sodium: 136 mmol/L (ref 135–145)
Sodium: 136 mmol/L (ref 135–145)
Sodium: 139 mmol/L (ref 135–145)
Sodium: 137 mmol/L (ref 135–145)
TCO2: 29 mmol/L (ref 22–32)
TCO2: 30 mmol/L (ref 22–32)
TCO2: 30 mmol/L (ref 22–32)
TCO2: 31 mmol/L (ref 22–32)
TCO2: 33 mmol/L — ABNORMAL HIGH (ref 22–32)
TCO2: 31 mmol/L (ref 22–32)
pCO2 arterial: 49.6 mmHg — ABNORMAL HIGH (ref 32.0–48.0)
pCO2 arterial: 43.5 mmHg (ref 32.0–48.0)
pCO2 arterial: 40.3 mmHg (ref 32.0–48.0)
pCO2 arterial: 43.7 mmHg (ref 32.0–48.0)
pCO2 arterial: 56 mmHg — ABNORMAL HIGH (ref 32.0–48.0)
pCO2 arterial: 46.6 mmHg (ref 32.0–48.0)
pH, Arterial: 7.356 (ref 7.350–7.450)
pH, Arterial: 7.423 (ref 7.350–7.450)
pH, Arterial: 7.463 — ABNORMAL HIGH (ref 7.350–7.450)
pH, Arterial: 7.433 (ref 7.350–7.450)
pH, Arterial: 7.361 (ref 7.350–7.450)
pH, Arterial: 7.41 (ref 7.350–7.450)
pO2, Arterial: 249 mmHg — ABNORMAL HIGH (ref 83.0–108.0)
pO2, Arterial: 261 mmHg — ABNORMAL HIGH (ref 83.0–108.0)
pO2, Arterial: 244 mmHg — ABNORMAL HIGH (ref 83.0–108.0)
pO2, Arterial: 194 mmHg — ABNORMAL HIGH (ref 83.0–108.0)
pO2, Arterial: 138 mmHg — ABNORMAL HIGH (ref 83.0–108.0)
pO2, Arterial: 175 mmHg — ABNORMAL HIGH (ref 83.0–108.0)

## 2020-11-17 LAB — GLUCOSE, CAPILLARY
Glucose-Capillary: 101 mg/dL — ABNORMAL HIGH (ref 70–99)
Glucose-Capillary: 103 mg/dL — ABNORMAL HIGH (ref 70–99)
Glucose-Capillary: 103 mg/dL — ABNORMAL HIGH (ref 70–99)
Glucose-Capillary: 104 mg/dL — ABNORMAL HIGH (ref 70–99)
Glucose-Capillary: 104 mg/dL — ABNORMAL HIGH (ref 70–99)
Glucose-Capillary: 114 mg/dL — ABNORMAL HIGH (ref 70–99)
Glucose-Capillary: 117 mg/dL — ABNORMAL HIGH (ref 70–99)
Glucose-Capillary: 129 mg/dL — ABNORMAL HIGH (ref 70–99)
Glucose-Capillary: 213 mg/dL — ABNORMAL HIGH (ref 70–99)
Glucose-Capillary: 229 mg/dL — ABNORMAL HIGH (ref 70–99)
Glucose-Capillary: 94 mg/dL (ref 70–99)

## 2020-11-17 LAB — DIC (DISSEMINATED INTRAVASCULAR COAGULATION)PANEL
D-Dimer, Quant: 2.01 ug/mL-FEU — ABNORMAL HIGH (ref 0.00–0.50)
Fibrinogen: 340 mg/dL (ref 210–475)
INR: 1.3 — ABNORMAL HIGH (ref 0.8–1.2)
Platelets: 189 10*3/uL (ref 150–400)
Prothrombin Time: 15.3 seconds — ABNORMAL HIGH (ref 11.4–15.2)
Smear Review: NONE SEEN
aPTT: 32 seconds (ref 24–36)

## 2020-11-17 LAB — BASIC METABOLIC PANEL
Anion gap: 11 (ref 5–15)
Anion gap: 11 (ref 5–15)
Anion gap: 10 (ref 5–15)
BUN: 15 mg/dL (ref 6–20)
BUN: 12 mg/dL (ref 6–20)
BUN: 13 mg/dL (ref 6–20)
CO2: 28 mmol/L (ref 22–32)
CO2: 28 mmol/L (ref 22–32)
CO2: 26 mmol/L (ref 22–32)
Calcium: 8.9 mg/dL (ref 8.9–10.3)
Calcium: 8.4 mg/dL — ABNORMAL LOW (ref 8.9–10.3)
Calcium: 9 mg/dL (ref 8.9–10.3)
Chloride: 90 mmol/L — ABNORMAL LOW (ref 98–111)
Chloride: 100 mmol/L (ref 98–111)
Chloride: 100 mmol/L (ref 98–111)
Creatinine, Ser: 0.79 mg/dL (ref 0.61–1.24)
Creatinine, Ser: 0.69 mg/dL (ref 0.61–1.24)
Creatinine, Ser: 0.69 mg/dL (ref 0.61–1.24)
GFR, Estimated: >60 mL/min (ref >60)
GFR, Estimated: >60 mL/min (ref >60)
GFR, Estimated: >60 mL/min (ref >60)
Glucose, Bld: 336 mg/dL — ABNORMAL HIGH (ref 70–99)
Glucose, Bld: 114 mg/dL — ABNORMAL HIGH (ref 70–99)
Glucose, Bld: 104 mg/dL — ABNORMAL HIGH (ref 70–99)
Potassium: 4.4 mmol/L (ref 3.5–5.1)
Potassium: 3.2 mmol/L — ABNORMAL LOW (ref 3.5–5.1)
Potassium: 4.1 mmol/L (ref 3.5–5.1)
Sodium: 129 mmol/L — ABNORMAL LOW (ref 135–145)
Sodium: 139 mmol/L (ref 135–145)
Sodium: 136 mmol/L (ref 135–145)

## 2020-11-17 LAB — POCT I-STAT, CHEM 8
BUN: 16 mg/dL (ref 6–20)
BUN: 15 mg/dL (ref 6–20)
BUN: 15 mg/dL (ref 6–20)
BUN: 15 mg/dL (ref 6–20)
BUN: 14 mg/dL (ref 6–20)
BUN: 15 mg/dL (ref 6–20)
BUN: 16 mg/dL (ref 6–20)
Calcium, Ion: 1.22 mmol/L (ref 1.15–1.40)
Calcium, Ion: 1.18 mmol/L (ref 1.15–1.40)
Calcium, Ion: 1.18 mmol/L (ref 1.15–1.40)
Calcium, Ion: 1.2 mmol/L (ref 1.15–1.40)
Calcium, Ion: 1.06 mmol/L — ABNORMAL LOW (ref 1.15–1.40)
Calcium, Ion: 1.05 mmol/L — ABNORMAL LOW (ref 1.15–1.40)
Calcium, Ion: 1.22 mmol/L (ref 1.15–1.40)
Chloride: 94 mmol/L — ABNORMAL LOW (ref 98–111)
Chloride: 95 mmol/L — ABNORMAL LOW (ref 98–111)
Chloride: 96 mmol/L — ABNORMAL LOW (ref 98–111)
Chloride: 96 mmol/L — ABNORMAL LOW (ref 98–111)
Chloride: 95 mmol/L — ABNORMAL LOW (ref 98–111)
Chloride: 96 mmol/L — ABNORMAL LOW (ref 98–111)
Chloride: 98 mmol/L (ref 98–111)
Creatinine, Ser: 0.7 mg/dL (ref 0.61–1.24)
Creatinine, Ser: 0.6 mg/dL — ABNORMAL LOW (ref 0.61–1.24)
Creatinine, Ser: 0.6 mg/dL — ABNORMAL LOW (ref 0.61–1.24)
Creatinine, Ser: 0.6 mg/dL — ABNORMAL LOW (ref 0.61–1.24)
Creatinine, Ser: 0.5 mg/dL — ABNORMAL LOW (ref 0.61–1.24)
Creatinine, Ser: 0.5 mg/dL — ABNORMAL LOW (ref 0.61–1.24)
Creatinine, Ser: 0.6 mg/dL — ABNORMAL LOW (ref 0.61–1.24)
Glucose, Bld: 222 mg/dL — ABNORMAL HIGH (ref 70–99)
Glucose, Bld: 228 mg/dL — ABNORMAL HIGH (ref 70–99)
Glucose, Bld: 215 mg/dL — ABNORMAL HIGH (ref 70–99)
Glucose, Bld: 195 mg/dL — ABNORMAL HIGH (ref 70–99)
Glucose, Bld: 155 mg/dL — ABNORMAL HIGH (ref 70–99)
Glucose, Bld: 147 mg/dL — ABNORMAL HIGH (ref 70–99)
Glucose, Bld: 161 mg/dL — ABNORMAL HIGH (ref 70–99)
HCT: 36 % — ABNORMAL LOW (ref 39.0–52.0)
HCT: 31 % — ABNORMAL LOW (ref 39.0–52.0)
HCT: 31 % — ABNORMAL LOW (ref 39.0–52.0)
HCT: 30 % — ABNORMAL LOW (ref 39.0–52.0)
HCT: 26 % — ABNORMAL LOW (ref 39.0–52.0)
HCT: 27 % — ABNORMAL LOW (ref 39.0–52.0)
HCT: 30 % — ABNORMAL LOW (ref 39.0–52.0)
Hemoglobin: 12.2 g/dL — ABNORMAL LOW (ref 13.0–17.0)
Hemoglobin: 10.5 g/dL — ABNORMAL LOW (ref 13.0–17.0)
Hemoglobin: 10.5 g/dL — ABNORMAL LOW (ref 13.0–17.0)
Hemoglobin: 10.2 g/dL — ABNORMAL LOW (ref 13.0–17.0)
Hemoglobin: 8.8 g/dL — ABNORMAL LOW (ref 13.0–17.0)
Hemoglobin: 10.2 g/dL — ABNORMAL LOW (ref 13.0–17.0)
Hemoglobin: 9.2 g/dL — ABNORMAL LOW (ref 13.0–17.0)
Potassium: 4.2 mmol/L (ref 3.5–5.1)
Potassium: 3.6 mmol/L (ref 3.5–5.1)
Potassium: 3.6 mmol/L (ref 3.5–5.1)
Potassium: 3.6 mmol/L (ref 3.5–5.1)
Potassium: 3.7 mmol/L (ref 3.5–5.1)
Potassium: 3.6 mmol/L (ref 3.5–5.1)
Potassium: 4 mmol/L (ref 3.5–5.1)
Sodium: 133 mmol/L — ABNORMAL LOW (ref 135–145)
Sodium: 133 mmol/L — ABNORMAL LOW (ref 135–145)
Sodium: 134 mmol/L — ABNORMAL LOW (ref 135–145)
Sodium: 134 mmol/L — ABNORMAL LOW (ref 135–145)
Sodium: 135 mmol/L (ref 135–145)
Sodium: 135 mmol/L (ref 135–145)
Sodium: 136 mmol/L (ref 135–145)
TCO2: 28 mmol/L (ref 22–32)
TCO2: 26 mmol/L (ref 22–32)
TCO2: 26 mmol/L (ref 22–32)
TCO2: 27 mmol/L (ref 22–32)
TCO2: 27 mmol/L (ref 22–32)
TCO2: 26 mmol/L (ref 22–32)
TCO2: 28 mmol/L (ref 22–32)

## 2020-11-17 LAB — CBC
HCT: 33.4 % — ABNORMAL LOW (ref 39.0–52.0)
HCT: 24.3 % — ABNORMAL LOW (ref 39.0–52.0)
HCT: 26.1 % — ABNORMAL LOW (ref 39.0–52.0)
Hemoglobin: 10.9 g/dL — ABNORMAL LOW (ref 13.0–17.0)
Hemoglobin: 7.9 g/dL — ABNORMAL LOW (ref 13.0–17.0)
Hemoglobin: 9.1 g/dL — ABNORMAL LOW (ref 13.0–17.0)
MCH: 28.2 pg (ref 26.0–34.0)
MCH: 28.4 pg (ref 26.0–34.0)
MCH: 29.9 pg (ref 26.0–34.0)
MCHC: 32.6 g/dL (ref 30.0–36.0)
MCHC: 32.5 g/dL (ref 30.0–36.0)
MCHC: 34.9 g/dL (ref 30.0–36.0)
MCV: 86.3 fL (ref 80.0–100.0)
MCV: 87.4 fL (ref 80.0–100.0)
MCV: 85.9 fL (ref 80.0–100.0)
Platelets: 175 10*3/uL (ref 150–400)
Platelets: 186 10*3/uL (ref 150–400)
Platelets: 169 10*3/uL (ref 150–400)
RBC: 3.87 MIL/uL — ABNORMAL LOW (ref 4.22–5.81)
RBC: 2.78 MIL/uL — ABNORMAL LOW (ref 4.22–5.81)
RBC: 3.04 MIL/uL — ABNORMAL LOW (ref 4.22–5.81)
RDW: 14.9 % (ref 11.5–15.5)
RDW: 14.8 % (ref 11.5–15.5)
RDW: 14.6 % (ref 11.5–15.5)
WBC: 11.3 10*3/uL — ABNORMAL HIGH (ref 4.0–10.5)
WBC: 21.6 10*3/uL — ABNORMAL HIGH (ref 4.0–10.5)
WBC: 13.6 10*3/uL — ABNORMAL HIGH (ref 4.0–10.5)
nRBC: 0 % (ref 0.0–0.2)
nRBC: 0.2 % (ref 0.0–0.2)
nRBC: 0.3 % — ABNORMAL HIGH (ref 0.0–0.2)

## 2020-11-17 LAB — PREPARE RBC (CROSSMATCH)

## 2020-11-17 LAB — COOXEMETRY PANEL
Carboxyhemoglobin: 1.9 % — ABNORMAL HIGH (ref 0.5–1.5)
Carboxyhemoglobin: 1.6 % — ABNORMAL HIGH (ref 0.5–1.5)
Methemoglobin: 1 % (ref 0.0–1.5)
Methemoglobin: 1.5 % (ref 0.0–1.5)
O2 Saturation: 54.5 %
O2 Saturation: 59.6 %
Total hemoglobin: 13.2 g/dL (ref 12.0–16.0)
Total hemoglobin: 7.9 g/dL — ABNORMAL LOW (ref 12.0–16.0)

## 2020-11-17 LAB — POCT I-STAT EG7
Acid-Base Excess: 3 mmol/L — ABNORMAL HIGH (ref 0.0–2.0)
Bicarbonate: 28.2 mmol/L — ABNORMAL HIGH (ref 20.0–28.0)
Calcium, Ion: 1.14 mmol/L — ABNORMAL LOW (ref 1.15–1.40)
HCT: 26 % — ABNORMAL LOW (ref 39.0–52.0)
Hemoglobin: 8.8 g/dL — ABNORMAL LOW (ref 13.0–17.0)
O2 Saturation: 66 %
Potassium: 3.5 mmol/L (ref 3.5–5.1)
Sodium: 135 mmol/L (ref 135–145)
TCO2: 30 mmol/L (ref 22–32)
pCO2, Ven: 48.2 mmHg (ref 44.0–60.0)
pH, Ven: 7.374 (ref 7.250–7.430)
pO2, Ven: 35 mmHg (ref 32.0–45.0)

## 2020-11-17 LAB — PROTIME-INR
INR: 1.3 — ABNORMAL HIGH (ref 0.8–1.2)
Prothrombin Time: 15.3 seconds — ABNORMAL HIGH (ref 11.4–15.2)

## 2020-11-17 LAB — FIBRINOGEN: Fibrinogen: 310 mg/dL (ref 210–475)

## 2020-11-17 LAB — NICOTINE/COTININE METABOLITES
Cotinine: 5.2 ng/mL
Nicotine: <1 ng/mL

## 2020-11-17 LAB — HEMOGLOBIN AND HEMATOCRIT, BLOOD
HCT: 22.9 % — ABNORMAL LOW (ref 39.0–52.0)
Hemoglobin: 7.5 g/dL — ABNORMAL LOW (ref 13.0–17.0)

## 2020-11-17 LAB — MAGNESIUM
Magnesium: 2.1 mg/dL (ref 1.7–2.4)
Magnesium: 2.6 mg/dL — ABNORMAL HIGH (ref 1.7–2.4)

## 2020-11-17 LAB — TRIGLYCERIDES: Triglycerides: 44 mg/dL (ref <150)

## 2020-11-17 LAB — APTT: aPTT: 32 seconds (ref 24–36)

## 2020-11-17 LAB — LACTATE DEHYDROGENASE: LDH: 287 U/L — ABNORMAL HIGH (ref 98–192)

## 2020-11-17 SURGERY — REDO STERNOTOMY
Anesthesia: General | Site: Chest

## 2020-11-17 MED ORDER — FENTANYL CITRATE (PF) 250 MCG/5ML IJ SOLN
INTRAMUSCULAR | Status: AC
  Filled 2020-11-17: qty 5

## 2020-11-17 MED ORDER — 0.9 % SODIUM CHLORIDE (POUR BTL) OPTIME
TOPICAL | Status: DC | PRN
  Administered 2020-11-17: 5000 mL

## 2020-11-17 MED ORDER — ASPIRIN EC 325 MG PO TBEC
325.0000 mg | DELAYED_RELEASE_TABLET | Freq: Every day | ORAL | Status: DC
  Administered 2020-11-19 – 2020-11-26 (×8): 325 mg via ORAL
  Filled 2020-11-17 (×9): qty 1

## 2020-11-17 MED ORDER — CHLORHEXIDINE GLUCONATE 0.12 % MT SOLN
15.0000 mL | OROMUCOSAL | Status: AC
  Administered 2020-11-17: 15 mL via OROMUCOSAL

## 2020-11-17 MED ORDER — MIDAZOLAM HCL 2 MG/2ML IJ SOLN
INTRAMUSCULAR | Status: AC
  Filled 2020-11-17: qty 4

## 2020-11-17 MED ORDER — EPINEPHRINE 1 MG/10ML IJ SOSY
PREFILLED_SYRINGE | INTRAMUSCULAR | Status: DC | PRN
  Administered 2020-11-17: .02 mg via INTRAVENOUS

## 2020-11-17 MED ORDER — NITROGLYCERIN IN D5W 200-5 MCG/ML-% IV SOLN: 0.0000 ug/min | INTRAVENOUS | Status: DC

## 2020-11-17 MED ORDER — SODIUM CHLORIDE 0.9 % IV SOLN
INTRAVENOUS | Status: AC
  Filled 2020-11-17: qty 1.2

## 2020-11-17 MED ORDER — ALBUMIN HUMAN 5 % IV SOLN: INTRAVENOUS | Status: DC | PRN

## 2020-11-17 MED ORDER — LACTATED RINGERS IV SOLN: INTRAVENOUS | Status: DC

## 2020-11-17 MED ORDER — MILRINONE LACTATE IN DEXTROSE 20-5 MG/100ML-% IV SOLN
0.2500 ug/kg/min | INTRAVENOUS | Status: DC
  Administered 2020-11-17 – 2020-11-19 (×4): 0.25 ug/kg/min via INTRAVENOUS
  Filled 2020-11-17 (×4): qty 100

## 2020-11-17 MED ORDER — VANCOMYCIN HCL 1000 MG IV SOLR
INTRAVENOUS | Status: AC
  Filled 2020-11-17: qty 3000

## 2020-11-17 MED ORDER — BISACODYL 5 MG PO TBEC
10.0000 mg | DELAYED_RELEASE_TABLET | Freq: Every day | ORAL | Status: DC
  Administered 2020-11-19 – 2020-11-26 (×6): 10 mg via ORAL
  Filled 2020-11-17 (×8): qty 2

## 2020-11-17 MED ORDER — CHLORHEXIDINE GLUCONATE 0.12% ORAL RINSE (MEDLINE KIT)
15.0000 mL | Freq: Two times a day (BID) | OROMUCOSAL | Status: DC
  Administered 2020-11-17: 15 mL via OROMUCOSAL

## 2020-11-17 MED ORDER — SODIUM CHLORIDE 0.9% FLUSH
10.0000 mL | INTRAVENOUS | Status: DC | PRN
Start: 2020-11-17 — End: 2020-11-28

## 2020-11-17 MED ORDER — TRAMADOL HCL 50 MG PO TABS: 50.0000 mg | ORAL_TABLET | ORAL | Status: DC | PRN

## 2020-11-17 MED ORDER — ETOMIDATE 2 MG/ML IV SOLN
INTRAVENOUS | Status: AC
  Filled 2020-11-17: qty 10

## 2020-11-17 MED ORDER — SODIUM CHLORIDE 0.9% FLUSH
10.0000 mL | Freq: Two times a day (BID) | INTRAVENOUS | Status: DC
  Administered 2020-11-18 – 2020-11-27 (×13): 10 mL

## 2020-11-17 MED ORDER — OXYCODONE HCL 5 MG PO TABS
5.0000 mg | ORAL_TABLET | ORAL | Status: DC | PRN
Start: 2020-11-17 — End: 2020-11-18
  Administered 2020-11-18: 10 mg via ORAL
  Filled 2020-11-17: qty 2

## 2020-11-17 MED ORDER — SODIUM CHLORIDE 0.9 % IV SOLN
600.0000 mg | Freq: Once | INTRAVENOUS | Status: AC
  Administered 2020-11-18: 08:00:00 600 mg via INTRAVENOUS
  Filled 2020-11-17: qty 600

## 2020-11-17 MED ORDER — PANTOPRAZOLE SODIUM 40 MG PO TBEC: 40.0000 mg | DELAYED_RELEASE_TABLET | Freq: Every day | ORAL | Status: DC

## 2020-11-17 MED ORDER — VANCOMYCIN HCL IN DEXTROSE 1-5 GM/200ML-% IV SOLN
1000.0000 mg | Freq: Two times a day (BID) | INTRAVENOUS | Status: AC
  Administered 2020-11-17 – 2020-11-18 (×3): 1000 mg via INTRAVENOUS
  Filled 2020-11-17 (×3): qty 200

## 2020-11-17 MED ORDER — ROCURONIUM BROMIDE 10 MG/ML (PF) SYRINGE
PREFILLED_SYRINGE | INTRAVENOUS | Status: DC | PRN
  Administered 2020-11-17: 100 mg via INTRAVENOUS
  Administered 2020-11-17 (×3): 50 mg via INTRAVENOUS
  Administered 2020-11-17: 100 mg via INTRAVENOUS

## 2020-11-17 MED ORDER — ORAL CARE MOUTH RINSE
15.0000 mL | OROMUCOSAL | Status: DC
  Administered 2020-11-17 – 2020-11-18 (×7): 15 mL via OROMUCOSAL

## 2020-11-17 MED ORDER — PHENYLEPHRINE 40 MCG/ML (10ML) SYRINGE FOR IV PUSH (FOR BLOOD PRESSURE SUPPORT)
PREFILLED_SYRINGE | INTRAVENOUS | Status: DC | PRN
  Administered 2020-11-17: 160 ug via INTRAVENOUS

## 2020-11-17 MED ORDER — ONDANSETRON HCL 4 MG/2ML IJ SOLN: 4.0000 mg | Freq: Four times a day (QID) | INTRAMUSCULAR | Status: DC | PRN

## 2020-11-17 MED ORDER — EPINEPHRINE 1 MG/10ML IJ SOSY
PREFILLED_SYRINGE | INTRAMUSCULAR | Status: AC
  Filled 2020-11-17: qty 10

## 2020-11-17 MED ORDER — PLATELET RICH PLASMA OPTIME
Status: DC | PRN
  Administered 2020-11-17: 10 mL

## 2020-11-17 MED ORDER — VANCOMYCIN HCL IN DEXTROSE 1-5 GM/200ML-% IV SOLN: 1000.0000 mg | Freq: Two times a day (BID) | INTRAVENOUS | Status: DC

## 2020-11-17 MED ORDER — PROPOFOL 1000 MG/100ML IV EMUL
5.0000 ug/kg/min | INTRAVENOUS | Status: DC
  Administered 2020-11-18: 04:00:00 60 ug/kg/min via INTRAVENOUS
  Administered 2020-11-18: 06:00:00 75 ug/kg/min via INTRAVENOUS
  Administered 2020-11-18: 09:00:00 70 ug/kg/min via INTRAVENOUS
  Administered 2020-11-18: 50 ug/kg/min via INTRAVENOUS
  Filled 2020-11-17: qty 100
  Filled 2020-11-17: qty 200
  Filled 2020-11-17: qty 100

## 2020-11-17 MED ORDER — INSULIN REGULAR(HUMAN) IN NACL 100-0.9 UT/100ML-% IV SOLN
INTRAVENOUS | Status: DC | PRN
  Administered 2020-11-17: 9 [IU]/h via INTRAVENOUS

## 2020-11-17 MED ORDER — VANCOMYCIN HCL 1000 MG IV SOLR
INTRAVENOUS | Status: DC | PRN
  Administered 2020-11-17: 3000 mg
  Administered 2020-11-17: 1000 mg

## 2020-11-17 MED ORDER — FENTANYL CITRATE (PF) 100 MCG/2ML IJ SOLN
INTRAMUSCULAR | Status: DC | PRN
  Administered 2020-11-17: 50 ug via INTRAVENOUS
  Administered 2020-11-17 (×2): 100 ug via INTRAVENOUS
  Administered 2020-11-17: 50 ug via INTRAVENOUS
  Administered 2020-11-17 (×2): 150 ug via INTRAVENOUS
  Administered 2020-11-17: 100 ug via INTRAVENOUS
  Administered 2020-11-17: 250 ug via INTRAVENOUS
  Administered 2020-11-17: 50 ug via INTRAVENOUS

## 2020-11-17 MED ORDER — SODIUM CHLORIDE 0.9 % IV SOLN: 250.0000 mL | INTRAVENOUS | Status: DC

## 2020-11-17 MED ORDER — MAGNESIUM SULFATE 4 GM/100ML IV SOLN: 4.0000 g | Freq: Once | INTRAVENOUS | Status: AC

## 2020-11-17 MED ORDER — BISACODYL 10 MG RE SUPP
10.0000 mg | Freq: Every day | RECTAL | Status: DC
  Administered 2020-11-18: 08:00:00 10 mg via RECTAL
  Filled 2020-11-17 (×2): qty 1

## 2020-11-17 MED ORDER — SODIUM CHLORIDE 0.9 % IR SOLN
Status: DC | PRN
Start: 2020-11-17 — End: 2020-11-17
  Administered 2020-11-17: 1000 mL

## 2020-11-17 MED ORDER — ALBUMIN HUMAN 5 % IV SOLN
250.0000 mL | INTRAVENOUS | Status: AC | PRN
  Administered 2020-11-17 – 2020-11-18 (×4): 12.5 g via INTRAVENOUS
  Filled 2020-11-17 (×2): qty 250

## 2020-11-17 MED ORDER — LACTATED RINGERS IV SOLN
500.0000 mL | Freq: Once | INTRAVENOUS | Status: AC | PRN
  Administered 2020-11-17: 500 mL via INTRAVENOUS

## 2020-11-17 MED ORDER — MIDAZOLAM HCL 2 MG/2ML IJ SOLN
INTRAMUSCULAR | Status: AC
  Filled 2020-11-17: qty 2

## 2020-11-17 MED ORDER — CHLORHEXIDINE GLUCONATE CLOTH 2 % EX PADS
6.0000 | MEDICATED_PAD | Freq: Every day | CUTANEOUS | Status: DC
  Administered 2020-11-18 – 2020-11-26 (×7): 6 via TOPICAL

## 2020-11-17 MED ORDER — LACTATED RINGERS IV SOLN: INTRAVENOUS | Status: DC | PRN

## 2020-11-17 MED ORDER — HEMOSTATIC AGENTS (NO CHARGE) OPTIME
TOPICAL | Status: DC | PRN
  Administered 2020-11-17 (×3): 1 via TOPICAL

## 2020-11-17 MED ORDER — MAGNESIUM SULFATE 4 GM/100ML IV SOLN
INTRAVENOUS | Status: AC
  Administered 2020-11-17: 4 g
  Filled 2020-11-17: qty 100

## 2020-11-17 MED ORDER — SODIUM CHLORIDE 0.45 % IV SOLN
INTRAVENOUS | Status: DC | PRN
Start: 2020-11-17 — End: 2020-11-24

## 2020-11-17 MED ORDER — EPINEPHRINE HCL 5 MG/250ML IV SOLN IN NS
0.0000 ug/min | INTRAVENOUS | Status: DC
  Administered 2020-11-17: 5 ug/min via INTRAVENOUS
  Administered 2020-11-18: 7 ug/min via INTRAVENOUS
  Administered 2020-11-18: 10 ug/min via INTRAVENOUS
  Administered 2020-11-19: 3 ug/min via INTRAVENOUS
  Filled 2020-11-17 (×4): qty 250

## 2020-11-17 MED ORDER — HEPARIN SODIUM (PORCINE) 1000 UNIT/ML IJ SOLN
INTRAMUSCULAR | Status: DC | PRN
  Administered 2020-11-17: 19000 [IU] via INTRAVENOUS
  Administered 2020-11-17: 5000 [IU] via INTRAVENOUS

## 2020-11-17 MED ORDER — PROPOFOL 1000 MG/100ML IV EMUL
INTRAVENOUS | Status: AC
  Administered 2020-11-17: 5 ug/kg/min via INTRAVENOUS
  Filled 2020-11-17: qty 100

## 2020-11-17 MED ORDER — SODIUM CHLORIDE 0.9 % IV SOLN
INTRAVENOUS | Status: DC | PRN
  Administered 2020-11-17: 09:00:00 500 mL

## 2020-11-17 MED ORDER — SODIUM CHLORIDE 0.9 % IV SOLN: INTRAVENOUS | Status: DC | PRN

## 2020-11-17 MED ORDER — DEXMEDETOMIDINE HCL IN NACL 400 MCG/100ML IV SOLN
0.0000 ug/kg/h | INTRAVENOUS | Status: DC
  Administered 2020-11-17: 0.9 ug/kg/h via INTRAVENOUS
  Filled 2020-11-17: qty 100

## 2020-11-17 MED ORDER — NOREPINEPHRINE BITARTRATE 1 MG/ML IV SOLN
INTRAVENOUS | Status: DC | PRN
  Administered 2020-11-17 (×4): 1 mL via INTRAVENOUS
  Administered 2020-11-17: 2 mL via INTRAVENOUS
  Administered 2020-11-17 (×2): 1 mL via INTRAVENOUS
  Administered 2020-11-17: .5 mL via INTRAVENOUS

## 2020-11-17 MED ORDER — ASPIRIN 300 MG RE SUPP: 300.0000 mg | Freq: Every day | RECTAL | Status: DC

## 2020-11-17 MED ORDER — DEXTROSE 50 % IV SOLN: 0.0000 mL | INTRAVENOUS | Status: DC | PRN

## 2020-11-17 MED ORDER — SODIUM CHLORIDE 0.9% FLUSH
3.0000 mL | INTRAVENOUS | Status: DC | PRN
  Administered 2020-11-27: 22:00:00 3 mL via INTRAVENOUS

## 2020-11-17 MED ORDER — VASOPRESSIN 20 UNIT/ML IV SOLN
INTRAVENOUS | Status: AC
  Filled 2020-11-17: qty 1

## 2020-11-17 MED ORDER — POTASSIUM CHLORIDE 10 MEQ/50ML IV SOLN
10.0000 meq | INTRAVENOUS | Status: AC
  Administered 2020-11-17 (×3): 10 meq via INTRAVENOUS

## 2020-11-17 MED ORDER — SODIUM CHLORIDE 0.9 % IV SOLN: INTRAVENOUS | Status: DC

## 2020-11-17 MED ORDER — PHENYLEPHRINE HCL-NACL 10-0.9 MG/250ML-% IV SOLN: 0.0000 ug/min | INTRAVENOUS | Status: DC

## 2020-11-17 MED ORDER — MIDAZOLAM HCL (PF) 5 MG/ML IJ SOLN
INTRAMUSCULAR | Status: DC | PRN
  Administered 2020-11-17: 1 mg via INTRAVENOUS
  Administered 2020-11-17 (×3): 2 mg via INTRAVENOUS
  Administered 2020-11-17: 3 mg via INTRAVENOUS
  Administered 2020-11-17: 2 mg via INTRAVENOUS

## 2020-11-17 MED ORDER — PROTAMINE SULFATE 10 MG/ML IV SOLN
INTRAVENOUS | Status: DC | PRN
  Administered 2020-11-17: 200 mg via INTRAVENOUS

## 2020-11-17 MED ORDER — SODIUM CHLORIDE 0.9% FLUSH
3.0000 mL | Freq: Two times a day (BID) | INTRAVENOUS | Status: DC
  Administered 2020-11-18 – 2020-11-27 (×9): 3 mL via INTRAVENOUS

## 2020-11-17 MED ORDER — DEXMEDETOMIDINE HCL IN NACL 400 MCG/100ML IV SOLN
INTRAVENOUS | Status: DC | PRN
  Administered 2020-11-17: .5 ug/kg/h via INTRAVENOUS

## 2020-11-17 MED ORDER — ASPIRIN 81 MG PO CHEW
324.0000 mg | CHEWABLE_TABLET | Freq: Every day | ORAL | Status: DC
  Administered 2020-11-18: 08:00:00 324 mg
  Filled 2020-11-17 (×2): qty 4

## 2020-11-17 MED ORDER — SODIUM CHLORIDE 0.9 % IV SOLN
1.5000 g | Freq: Two times a day (BID) | INTRAVENOUS | Status: AC
  Administered 2020-11-17 – 2020-11-19 (×4): 1.5 g via INTRAVENOUS
  Filled 2020-11-17 (×4): qty 1.5

## 2020-11-17 MED ORDER — PLATELET POOR PLASMA OPTIME
Status: DC | PRN
  Administered 2020-11-17: 10 mL

## 2020-11-17 MED ORDER — ARTIFICIAL TEARS OPHTHALMIC OINT
TOPICAL_OINTMENT | OPHTHALMIC | Status: DC | PRN
  Administered 2020-11-17: 1 via OPHTHALMIC

## 2020-11-17 MED ORDER — MIDAZOLAM HCL 2 MG/2ML IJ SOLN
2.0000 mg | INTRAMUSCULAR | Status: DC | PRN
  Administered 2020-11-17 – 2020-11-18 (×4): 2 mg via INTRAVENOUS
  Filled 2020-11-17 (×5): qty 2

## 2020-11-17 MED ORDER — FAMOTIDINE IN NACL 20-0.9 MG/50ML-% IV SOLN
20.0000 mg | Freq: Two times a day (BID) | INTRAVENOUS | Status: AC
  Administered 2020-11-17 – 2020-11-18 (×2): 20 mg via INTRAVENOUS
  Filled 2020-11-17 (×2): qty 50

## 2020-11-17 MED ORDER — DOCUSATE SODIUM 100 MG PO CAPS: 200.0000 mg | ORAL_CAPSULE | Freq: Every day | ORAL | Status: DC

## 2020-11-17 MED ORDER — SODIUM CHLORIDE 0.9% IV SOLUTION: Freq: Once | INTRAVENOUS | Status: AC

## 2020-11-17 MED ORDER — INSULIN REGULAR(HUMAN) IN NACL 100-0.9 UT/100ML-% IV SOLN
INTRAVENOUS | Status: DC
  Administered 2020-11-18: 09:00:00 2 [IU]/h via INTRAVENOUS
  Filled 2020-11-17: qty 100

## 2020-11-17 MED ORDER — MORPHINE SULFATE (PF) 2 MG/ML IV SOLN
1.0000 mg | INTRAVENOUS | Status: DC | PRN
Start: 2020-11-17 — End: 2020-11-20
  Administered 2020-11-17 (×3): 4 mg via INTRAVENOUS
  Administered 2020-11-18: 2 mg via INTRAVENOUS
  Administered 2020-11-18: 4 mg via INTRAVENOUS
  Administered 2020-11-18: 2 mg via INTRAVENOUS
  Administered 2020-11-18: 4 mg via INTRAVENOUS
  Administered 2020-11-18 (×2): 2 mg via INTRAVENOUS
  Administered 2020-11-19 (×2): 4 mg via INTRAVENOUS
  Administered 2020-11-20: 2 mg via INTRAVENOUS
  Filled 2020-11-17 (×2): qty 2
  Filled 2020-11-17: qty 1
  Filled 2020-11-17 (×2): qty 2
  Filled 2020-11-17: qty 1
  Filled 2020-11-17: qty 2
  Filled 2020-11-17: qty 1
  Filled 2020-11-17 (×3): qty 2

## 2020-11-17 MED ORDER — PHENYLEPHRINE HCL (PRESSORS) 10 MG/ML IV SOLN
INTRAVENOUS | Status: AC
  Filled 2020-11-17: qty 2

## 2020-11-17 MED ORDER — FENTANYL CITRATE (PF) 250 MCG/5ML IJ SOLN
INTRAMUSCULAR | Status: AC
  Filled 2020-11-17: qty 10

## 2020-11-17 MED ORDER — ETOMIDATE 2 MG/ML IV SOLN
INTRAVENOUS | Status: DC | PRN
  Administered 2020-11-17: 16 mg via INTRAVENOUS

## 2020-11-17 MED ORDER — PROPOFOL 10 MG/ML IV BOLUS
INTRAVENOUS | Status: AC
  Filled 2020-11-17: qty 20

## 2020-11-17 MED ORDER — FLUCONAZOLE IN SODIUM CHLORIDE 400-0.9 MG/200ML-% IV SOLN
400.0000 mg | Freq: Once | INTRAVENOUS | Status: AC
  Administered 2020-11-18: 06:00:00 400 mg via INTRAVENOUS
  Filled 2020-11-17: qty 200

## 2020-11-17 MED ORDER — NOREPINEPHRINE 4 MG/250ML-% IV SOLN
0.0000 ug/min | INTRAVENOUS | Status: DC
  Administered 2020-11-17: 6 ug/min via INTRAVENOUS
  Administered 2020-11-18: 20:00:00 3 ug/min via INTRAVENOUS
  Administered 2020-11-18: 06:00:00 5 ug/min via INTRAVENOUS
  Filled 2020-11-17 (×2): qty 250

## 2020-11-17 MED ORDER — THROMBIN 5000 UNITS EX SOLR
INTRAVENOUS | Status: DC | PRN
  Administered 2020-11-17: 2 mL

## 2020-11-17 MED ORDER — IPRATROPIUM-ALBUTEROL 0.5-2.5 (3) MG/3ML IN SOLN
3.0000 mL | Freq: Four times a day (QID) | RESPIRATORY_TRACT | Status: DC
  Administered 2020-11-17 – 2020-11-18 (×3): 3 mL via RESPIRATORY_TRACT
  Filled 2020-11-17 (×4): qty 3

## 2020-11-17 MED ORDER — SODIUM CHLORIDE 0.9 % IV SOLN
INTRAVENOUS | Status: DC | PRN
  Administered 2020-11-17: 1.5 g via INTRAVENOUS

## 2020-11-17 MED ORDER — PROTAMINE SULFATE 10 MG/ML IV SOLN
INTRAVENOUS | Status: AC
  Filled 2020-11-17: qty 25

## 2020-11-17 SURGICAL SUPPLY — 131 items
ADAPTER CARDIO PERF ANTE/RETRO (ADAPTER) IMPLANT
ADH SKN CLS APL DERMABOND .7 (GAUZE/BANDAGES/DRESSINGS) ×6
ADPR PRFSN 84XANTGRD RTRGD (ADAPTER)
ANTEGRADE CPLG (MISCELLANEOUS) IMPLANT
APL PRP STRL LF DISP 70% ISPRP (MISCELLANEOUS)
APL SRG 7X2 LUM MLBL SLNT (VASCULAR PRODUCTS) ×2
APPLICATOR TIP COSEAL (VASCULAR PRODUCTS) ×1 IMPLANT
BAG DECANTER FOR FLEXI CONT (MISCELLANEOUS) ×3 IMPLANT
BLADE CLIPPER SURG (BLADE) ×3 IMPLANT
BLADE CORE FAN STRYKER (BLADE) IMPLANT
BLADE OSCILLATING /SAGITTAL (BLADE) ×1 IMPLANT
BLADE STERNUM SYSTEM 6 (BLADE) ×2 IMPLANT
BLADE SURG 12 STRL SS (BLADE) ×2 IMPLANT
BLADE SURG 15 STRL LF DISP TIS (BLADE) IMPLANT
BLADE SURG 15 STRL SS (BLADE) ×3
CANISTER SUCT 3000ML PPV (MISCELLANEOUS) ×3 IMPLANT
CANNULA ARTERIAL NVNT 3/8 20FR (MISCELLANEOUS) ×3 IMPLANT
CANNULA SUMP PERICARDIAL (CANNULA) ×3 IMPLANT
CANNULA VENOUS LOW PROF 34X46 (CANNULA) ×3 IMPLANT
CATH FOLEY 2WAY SLVR  5CC 14FR (CATHETERS)
CATH FOLEY 2WAY SLVR 5CC 14FR (CATHETERS) ×2 IMPLANT
CATH HEART VENT LEFT (CATHETERS) IMPLANT
CATH ROBINSON RED A/P 18FR (CATHETERS) ×6 IMPLANT
CATH SILICON 18FR 30CC (CATHETERS) ×1 IMPLANT
CATH THORACIC 28FR (CATHETERS) IMPLANT
CHLORAPREP W/TINT 26 (MISCELLANEOUS) ×2 IMPLANT
CLIP VESOCCLUDE LG 6/CT (CLIP) ×2 IMPLANT
CNTNR URN SCR LID CUP LEK RST (MISCELLANEOUS) IMPLANT
CONN 3/8X1/2 ST GISH (MISCELLANEOUS) ×1 IMPLANT
CONN ST 1/4X3/8  BEN (MISCELLANEOUS)
CONN ST 1/4X3/8 BEN (MISCELLANEOUS) IMPLANT
CONT SPEC 4OZ STRL OR WHT (MISCELLANEOUS) ×3
COVER BACK TABLE 80X110 HD (DRAPES) ×1 IMPLANT
DERMABOND ADVANCED (GAUZE/BANDAGES/DRESSINGS) ×3
DERMABOND ADVANCED .7 DNX12 (GAUZE/BANDAGES/DRESSINGS) IMPLANT
DRAIN CHANNEL 28F RND 3/8 FF (WOUND CARE) ×6 IMPLANT
DRAIN CHANNEL 32F RND 10.7 FF (WOUND CARE) ×4 IMPLANT
DRAPE CV SPLIT W-CLR ANES SCRN (DRAPES) ×3 IMPLANT
DRAPE INCISE IOBAN 66X45 STRL (DRAPES) ×7 IMPLANT
DRAPE PERI GROIN 82X75IN TIB (DRAPES) ×3 IMPLANT
DRAPE SLUSH/WARMER DISC (DRAPES) ×3 IMPLANT
DRSG AQUACEL AG ADV 3.5X 6 (GAUZE/BANDAGES/DRESSINGS) ×1 IMPLANT
DRSG AQUACEL AG ADV 3.5X14 (GAUZE/BANDAGES/DRESSINGS) ×3 IMPLANT
ELECT BLADE 4.0 EZ CLEAN MEGAD (MISCELLANEOUS) ×3
ELECT BLADE 6.5 EXT (BLADE) ×3 IMPLANT
ELECT CAUTERY BLADE 6.4 (BLADE) ×3 IMPLANT
ELECT REM PT RETURN 9FT ADLT (ELECTROSURGICAL) ×3
ELECTRODE BLDE 4.0 EZ CLN MEGD (MISCELLANEOUS) ×2 IMPLANT
ELECTRODE REM PT RTRN 9FT ADLT (ELECTROSURGICAL) ×2 IMPLANT
EVACUATOR SILICONE 100CC (DRAIN) ×1 IMPLANT
FELT TEFLON 1X6 (MISCELLANEOUS) ×1 IMPLANT
FELT TEFLON 6X6 (MISCELLANEOUS) ×3 IMPLANT
GAUZE SPONGE 4X4 12PLY STRL (GAUZE/BANDAGES/DRESSINGS) ×6 IMPLANT
GLOVE BIO SURGEON STRL SZ 6.5 (GLOVE) ×4 IMPLANT
GLOVE BIO SURGEON STRL SZ7.5 (GLOVE) ×2 IMPLANT
GLOVE NEODERM STER SZ 7 (GLOVE) ×1 IMPLANT
GLOVE NEODERM STRL 7.5 LF PF (GLOVE) ×6 IMPLANT
GLOVE SURG NEODERM 7.5  LF PF (GLOVE) ×2
GLOVE SURG UNDER POLY LF SZ6.5 (GLOVE) ×2 IMPLANT
GLOVE SURG UNDER POLY LF SZ7 (GLOVE) ×3 IMPLANT
GOWN STRL REUS W/ TWL LRG LVL3 (GOWN DISPOSABLE) ×8 IMPLANT
GOWN STRL REUS W/TWL LRG LVL3 (GOWN DISPOSABLE) ×39
GRAFT HEMASHIELD 10MM (Graft) ×3 IMPLANT
GRAFT VASC STRG 30X10STRL (Graft) IMPLANT
HEMOSTAT POWDER SURGIFOAM 1G (HEMOSTASIS) ×12 IMPLANT
HEMOSTAT SURGICEL 2X14 (HEMOSTASIS) ×1 IMPLANT
INSERT FOGARTY SM (MISCELLANEOUS) ×1 IMPLANT
INSERT FOGARTY XLG (MISCELLANEOUS) IMPLANT
INSERT SUTURE HOLDER (MISCELLANEOUS) ×1 IMPLANT
KIT APPLICATOR RATIO 11:1 (KITS) ×1 IMPLANT
KIT BASIN OR (CUSTOM PROCEDURE TRAY) ×3 IMPLANT
KIT DILATOR VASC 18G NDL (KITS) ×1 IMPLANT
KIT DRAINAGE VACCUM ASSIST (KITS) ×1 IMPLANT
KIT LVAD HEARTMATE 3 W-CNTRL (Prosthesis & Implant Heart) IMPLANT
KIT LVAD HEARTMATE III W-CNTRL (Prosthesis & Implant Heart) ×1 IMPLANT
KIT SUCTION CATH 14FR (SUCTIONS) ×3 IMPLANT
KIT TURNOVER KIT B (KITS) ×3 IMPLANT
LEAD PACING MYOCARDI (MISCELLANEOUS) IMPLANT
LINE VENT (MISCELLANEOUS) ×1 IMPLANT
NS IRRIG 1000ML POUR BTL (IV SOLUTION) ×15 IMPLANT
PACK OPEN HEART (CUSTOM PROCEDURE TRAY) ×3 IMPLANT
PACK PLATELET PROCEDURE 60 (MISCELLANEOUS) ×1 IMPLANT
PAD ARMBOARD 7.5X6 YLW CONV (MISCELLANEOUS) ×6 IMPLANT
PAD DEFIB R2 (MISCELLANEOUS) ×3 IMPLANT
POSITIONER HEAD DONUT 9IN (MISCELLANEOUS) ×3 IMPLANT
POWDER SURGICEL 3.0 GRAM (HEMOSTASIS) ×1 IMPLANT
PUNCH AORTIC ROTATE 5MM 8IN (MISCELLANEOUS) ×1 IMPLANT
SEALANT SURG COSEAL 8ML (VASCULAR PRODUCTS) ×4 IMPLANT
SET CARDIOPLEGIA MPS 5001102 (MISCELLANEOUS) ×1 IMPLANT
SHEATH AVANTI 11CM 5FR (SHEATH) IMPLANT
SUT ETHIBOND 2 0 SH (SUTURE) ×15
SUT ETHIBOND 2 0 SH 36X2 (SUTURE) ×10 IMPLANT
SUT ETHIBOND NAB MH 2-0 36IN (SUTURE) ×38 IMPLANT
SUT MNCRL AB 3-0 PS2 18 (SUTURE) ×7 IMPLANT
SUT PDS AB 1 CTX 36 (SUTURE) ×6 IMPLANT
SUT PROLENE 3 0 RB 1 (SUTURE) IMPLANT
SUT PROLENE 3 0 SH DA (SUTURE) ×6 IMPLANT
SUT PROLENE 4 0 RB 1 (SUTURE) ×18
SUT PROLENE 4 0 SH DA (SUTURE) ×2 IMPLANT
SUT PROLENE 4-0 RB1 .5 CRCL 36 (SUTURE) ×8 IMPLANT
SUT PROLENE 5 0 C 1 36 (SUTURE) ×2 IMPLANT
SUT PROLENE 5 0 C1 (SUTURE) IMPLANT
SUT SILK  1 MH (SUTURE) ×5
SUT SILK 1 MH (SUTURE) ×10 IMPLANT
SUT SILK 1 TIES 10X30 (SUTURE) ×3 IMPLANT
SUT SILK 2 0 SH CR/8 (SUTURE) ×6 IMPLANT
SUT STEEL 6MS V (SUTURE) ×5 IMPLANT
SUT STEEL SZ 6 DBL 3X14 BALL (SUTURE) ×3 IMPLANT
SUT TEM PAC WIRE 2 0 SH (SUTURE) IMPLANT
SUT VIC AB 1 CTX 36 (SUTURE) ×6
SUT VIC AB 1 CTX36XBRD ANBCTR (SUTURE) ×4 IMPLANT
SUT VIC AB 2-0 CT1 27 (SUTURE) ×6
SUT VIC AB 2-0 CT1 TAPERPNT 27 (SUTURE) IMPLANT
SUT VIC AB 2-0 CTX 27 (SUTURE) ×6 IMPLANT
SUT VIC AB 3-0 SH 8-18 (SUTURE) ×3 IMPLANT
SUT VIC AB 3-0 X1 27 (SUTURE) ×6 IMPLANT
SYR 30ML SLIP (SYRINGE) ×1 IMPLANT
SYR 50ML LL SCALE MARK (SYRINGE) ×3 IMPLANT
SYSTEM SAHARA CHEST DRAIN ATS (WOUND CARE) ×3 IMPLANT
TAPE CLOTH SURG 4X10 WHT LF (GAUZE/BANDAGES/DRESSINGS) ×2 IMPLANT
TIP DUAL SPRAY TOPICAL (TIP) ×2 IMPLANT
TOWEL GREEN STERILE (TOWEL DISPOSABLE) ×3 IMPLANT
TRAY CATH LUMEN 1 20CM STRL (SET/KITS/TRAYS/PACK) ×3 IMPLANT
TRAY FOLEY SLVR 16FR TEMP STAT (SET/KITS/TRAYS/PACK) ×3 IMPLANT
TUBE CONNECTING 12X1/4 (SUCTIONS) ×4 IMPLANT
TUBE CONNECTING 20X1/4 (TUBING) ×1 IMPLANT
TUBE SUCT INTRACARD DLP 20F (MISCELLANEOUS) ×1 IMPLANT
UNDERPAD 30X36 HEAVY ABSORB (UNDERPADS AND DIAPERS) ×2 IMPLANT
VENT LEFT HEART 12002 (CATHETERS)
WATER STERILE IRR 1000ML POUR (IV SOLUTION) ×6 IMPLANT
YANKAUER SUCT BULB TIP NO VENT (SUCTIONS) ×4 IMPLANT

## 2020-11-17 NOTE — Anesthesia Procedure Notes (Signed)
Procedure Name: Intubation Date/Time: 11/17/2020 7:49 AM Performed by: Thelma Comp, CRNA Pre-anesthesia Checklist: Patient identified, Emergency Drugs available, Suction available and Patient being monitored Patient Re-evaluated:Patient Re-evaluated prior to induction Oxygen Delivery Method: Circle System Utilized Preoxygenation: Pre-oxygenation with 100% oxygen Induction Type: IV induction Ventilation: Mask ventilation without difficulty Laryngoscope Size: Mac and 4 Grade View: Grade I Tube type: Oral Tube size: 8.0 mm Number of attempts: 1 Airway Equipment and Method: Stylet and Oral airway Placement Confirmation: ETT inserted through vocal cords under direct vision,  positive ETCO2 and breath sounds checked- equal and bilateral Secured at: 22 cm Tube secured with: Tape Dental Injury: Teeth and Oropharynx as per pre-operative assessment

## 2020-11-17 NOTE — H&P (Signed)
History and Physical Interval Note:  11/17/2020 7:21 AM  Kevin Underwood  has presented today for surgery, with the diagnosis of CHF.  The various methods of treatment have been discussed with the patient and family. After consideration of risks, benefits and other options for treatment, the patient has consented to  Procedure(s): REDO STERNOTOMY (N/A) INSERTION OF IMPLANTABLE LEFT VENTRICULAR ASSIST DEVICE - HM3 (N/A) TRANSESOPHAGEAL ECHOCARDIOGRAM (TEE) (N/A) as a surgical intervention.  The patient's history has been reviewed, patient examined, no change in status, stable for surgery.  I have reviewed the patient's chart and labs.  Questions were answered to the patient's satisfaction.     [name]

## 2020-11-17 NOTE — Progress Notes (Signed)
NAME:  Kevin Underwood, MRN:  [id number], DOB:  June 12, 1961, LOS: 9 ADMISSION DATE:  11/08/2020, CONSULTATION DATE:  11/17/2020 REFERRING MD: Orvan Seen, CHIEF COMPLAINT: Status post VAD implantation  HPI/course in hospital  59 year old man with history of ischemic cardiomyopathy presented 12/1 with shortness of breath and chest tightness.  Increasing weight, increasing abdominal girth and poor appetite.  Known ischemic cardiomyopathy with prior three-vessel bypass 1 year ago requiring perioperative Impella support.  Right heart catheterization showing a low SCV O2 and cardiac index.  Impella 5.5 inserted for preoperative optimization, diuresed.  Hemodynamics improved  Underwent implantation HeartMate 3 LVAD.  Received 4 L of fluid intraoperatively.  Came off bypass uneventfully  Past Medical History   Past Medical History:  Diagnosis Date  . Arthritis   . CAD (coronary artery disease)    a. s/p CABG in 11/2019 with LIMA-LAD, SVG-OM1, SVG-PDA and SVG-D1  . CHF (congestive heart failure) ([place])    a. EF < 20% by echo in 11/2019  . COPD (chronic obstructive pulmonary disease) ([place])   . Essential hypertension   . PAF (paroxysmal atrial fibrillation) ([place])   . Type 2 diabetes mellitus ([place])      Past Surgical History:  Procedure Laterality Date  . BACK SURGERY    . CLIPPING OF ATRIAL APPENDAGE N/A 11/26/2019   Procedure: Clipping Of Atrial Appendage using AtriCure 40 Clip;  Surgeon: Wonda Olds, MD;  Location: [place];  Service: Open Heart Surgery;  Laterality: N/A;  . CORONARY ARTERY BYPASS GRAFT N/A 11/26/2019   Procedure: CORONARY ARTERY BYPASS GRAFTING (CABG) using endoscopic greater saphenous vein harvest: svc to OM; svc to Diag; svc to PD; and LIMA to LAD.;  Surgeon: Wonda Olds, MD;  Location: [place];  Service: Open Heart Surgery;  Laterality: N/A;  . FOOT SURGERY    . HAND SURGERY    . INTRAOPERATIVE TRANSESOPHAGEAL ECHOCARDIOGRAM  12/04/2019   Procedure: Intraoperative  Transesophageal Echocardiogram;  Surgeon: Wonda Olds, MD;  Location: [facility] OR;  Service: Open Heart Surgery;;  . MAZE N/A 11/26/2019   Procedure: MAZE using Bilateral Pulmonary Vein isolation.;  Surgeon: Wonda Olds, MD;  Location: [place];  Service: Open Heart Surgery;  Laterality: N/A;  . MULTIPLE EXTRACTIONS WITH ALVEOLOPLASTY N/A 11/16/2020   Procedure: MULTIPLE EXTRACTION WITH ALVEOLOPLASTY;  Surgeon: Charlaine Dalton, DMD;  Location: [place];  Service: Dentistry;  Laterality: N/A;  . PLACEMENT OF IMPELLA LEFT VENTRICULAR ASSIST DEVICE N/A 11/24/2019   Procedure: PLACEMENT OF IMPELLA 5.5 LEFT VENTRICULAR ASSIST DEVICE;  Surgeon: Wonda Olds, MD;  Location: [place];  Service: Open Heart Surgery;  Laterality: N/A;  . PLACEMENT OF IMPELLA LEFT VENTRICULAR ASSIST DEVICE Left 11/10/2020   Procedure: PLACEMENT OF IMPELLA 5.5 LEFT VENTRICULAR ASSIST DEVICE VIA  LEFT AVILLARY ARTERY;  Surgeon: Wonda Olds, MD;  Location: [place];  Service: Open Heart Surgery;  Laterality: Left;  . REMOVAL OF IMPELLA LEFT VENTRICULAR ASSIST DEVICE Right 12/04/2019   Procedure: REMOVAL OF IMPELLA LEFT VENTRICULAR ASSIST DEVICE, right axilla;  Surgeon: Wonda Olds, MD;  Location: [place];  Service: Open Heart Surgery;  Laterality: Right;  . RIGHT/LEFT HEART CATH AND CORONARY ANGIOGRAPHY N/A 11/24/2019   Procedure: RIGHT/LEFT HEART CATH AND CORONARY ANGIOGRAPHY;  Surgeon: Larey Dresser, MD;  Location: [place] CV [facility];  Service: Cardiovascular;  Laterality: N/A;  . RIGHT/LEFT HEART CATH AND CORONARY/GRAFT ANGIOGRAPHY N/A 11/09/2020   Procedure: RIGHT/LEFT HEART CATH AND CORONARY/GRAFT ANGIOGRAPHY;  Surgeon: Nelva Bush, MD;  Location: [place] CV LAB;  Service: Cardiovascular;  Laterality: N/A;  . TEE WITHOUT CARDIOVERSION N/A 11/24/2019   Procedure: TRANSESOPHAGEAL ECHOCARDIOGRAM (TEE);  Surgeon: Wonda Olds, MD;  Location: [place];  Service: Open Heart Surgery;  Laterality: N/A;  . TEE  WITHOUT CARDIOVERSION N/A 11/26/2019   Procedure: TRANSESOPHAGEAL ECHOCARDIOGRAM (TEE);  Surgeon: Wonda Olds, MD;  Location: [place];  Service: Open Heart Surgery;  Laterality: N/A;  . TEE WITHOUT CARDIOVERSION N/A 11/10/2020   Procedure: TRANSESOPHAGEAL ECHOCARDIOGRAM (TEE);  Surgeon: Wonda Olds, MD;  Location: [place];  Service: Open Heart Surgery;  Laterality: N/A;     Review of Systems:   Review of Systems  Unable to perform ROS: Critical illness    Social History   reports that he quit smoking about 10 months ago. His smoking use included cigarettes. He has a 30.00 pack-year smoking history. He has never used smokeless tobacco. He reports current alcohol use. He reports that he does not use drugs.   Family History   His family history includes Arthritis in an other family member; Asthma in an other family member; Diabetes in an other family member; Heart disease in his sister; Lung disease in an other family member.   Allergies No Known Allergies   Home Medications  Prior to Admission medications   Medication Sig Start Date End Date Taking? Authorizing Provider  albuterol (PROVENTIL) (2.5 MG/3ML) 0.083% nebulizer solution Take 2.5 mg by nebulization every 6 (six) hours as needed for wheezing or shortness of breath.  08/25/20  Yes [provider]  aspirin EC 81 MG tablet Take 81 mg by mouth daily. Swallow whole.   Yes [provider]  carvedilol (COREG) 6.25 MG tablet TAKE 1 TABLET(6.25 MG) BY MOUTH TWICE DAILY WITH A MEAL Patient taking differently: Take 6.25 mg by mouth 2 (two) times daily with a meal.  09/29/20  Yes Verta Ellen., NP  clopidogrel (PLAVIX) 75 MG tablet TAKE 1 TABLET(75 MG) BY MOUTH DAILY Patient taking differently: Take 75 mg by mouth once.  09/29/20  Yes Verta Ellen., NP  COMBIVENT RESPIMAT 20-100 MCG/ACT AERS respimat Inhale 1 puff into the lungs every 6 (six) hours as needed. 12/08/19  Yes Gold, Patrick Jupiter E, PA-C   dapagliflozin propanediol (FARXIGA) 5 MG TABS tablet Take 1 tablet (5 mg total) by mouth daily before breakfast. 09/25/20  Yes Barton Dubois, MD  digoxin (LANOXIN) 0.125 MG tablet TAKE 1 TABLET(0.125 MG) BY MOUTH DAILY Patient taking differently: Take 0.125 mg by mouth daily.  09/29/20  Yes Verta Ellen., NP  ENTRESTO 24-26 MG TAKE 1 TABLET BY MOUTH TWICE DAILY 09/29/20  Yes Verta Ellen., NP  furosemide (LASIX) 80 MG tablet Take 0.5-1 tablets (40-80 mg total) by mouth See admin instructions. 80mg  in the am 40mg  in the evening 09/25/20 09/25/21 Yes Barton Dubois, MD  gabapentin (NEURONTIN) 600 MG tablet Take 1 tablet (600 mg total) by mouth 3 (three) times daily. 09/25/20  Yes Barton Dubois, MD  magnesium oxide (MAG-OX) 400 (241.3 Mg) MG tablet Take 1 tablet (400 mg total) by mouth daily. 09/25/20  Yes Barton Dubois, MD  metFORMIN (GLUCOPHAGE) 500 MG tablet Take 2 tablets (1,000 mg total) by mouth 2 (two) times daily with a meal. 09/25/20  Yes Barton Dubois, MD  mometasone-formoterol [facility]) 200-5 MCG/ACT AERO Inhale 2 puffs into the lungs in the morning and at bedtime. 09/25/20  Yes Barton Dubois, MD  Multiple Vitamin (MULTIVITAMIN WITH MINERALS) TABS  tablet Take 1 tablet by mouth daily. 12/09/19  Yes Gold, Wayne E, PA-C  OLANZapine (ZYPREXA) 5 MG tablet Take 5 mg by mouth at bedtime. 10/25/20  Yes [provider]  pantoprazole (PROTONIX) 40 MG tablet Take 1 tablet (40 mg total) by mouth daily. 09/26/20  Yes Barton Dubois, MD  potassium chloride SA (KLOR-CON) 20 MEQ tablet Take 2 tablets (40 mEq total) by mouth 2 (two) times daily. 09/25/20  Yes Barton Dubois, MD  rosuvastatin (CRESTOR) 40 MG tablet Take 1 tablet (40 mg total) by mouth daily. 09/25/20  Yes Barton Dubois, MD  spironolactone (ALDACTONE) 25 MG tablet Take 0.5 tablets (12.5 mg total) by mouth daily. 10/12/20  Yes Verta Ellen., NP  Tetrahydrozoline HCl (REDNESS RELIEVER EYE DROPS OP) Apply 2 drops to  eye daily as needed.   Yes [provider]  traZODone (DESYREL) 150 MG tablet Take 150 mg by mouth at bedtime. 09/27/20  Yes [provider]  blood glucose meter kit and supplies KIT Dispense based on patient and insurance preference. Use to check CBG's three times a day. (FOR ICD-9 250.00, 250.01). 09/25/20   Barton Dubois, MD     Interim history/subjective:  Initially hypertensive.  Blood pressures dropped following sedation administration.  Minimal chest tube output.  Objective   Blood pressure (!) 109/54, pulse 85, temperature 98.24 F (36.8 C), resp. rate 16, height $RemoveBe'5\' 10"'gVkqRfPIN$  (1.778 m), weight 77.9 kg, SpO2 (!) 89 %. PAP: (26-40)/(10-13) 26/10 CVP:  [8 mmHg-12 mmHg] 11 mmHg CO:  [4.8 L/min-4.9 L/min] 4.8 L/min CI:  [2.4 L/min/m2-2.5 L/min/m2] 2.4 L/min/m2  Vent Mode: SIMV;PRVC;PSV FiO2 (%):  [50 %] 50 % Set Rate:  [12 bmp] 12 bmp Vt Set:  [580 mL] 580 mL PEEP:  [5 cmH20] 5 cmH20 Pressure Support:  [10 cmH20] 10 cmH20 Plateau Pressure:  [17 cmH20] 17 cmH20   Intake/Output Summary (Last 24 hours) at 11/17/2020 1650 Last data filed at 11/17/2020 1500 Gross per 24 hour  Intake 7413.92 ml  Output 4719 ml  Net 2694.92 ml   Filed Weights   11/15/20 0630 11/16/20 0627 11/17/20 0444  Weight: 79.2 kg 78.5 kg 77.9 kg    Examination: Physical Exam Constitutional:      Appearance: Normal appearance. He is overweight.     Interventions: He is sedated and intubated.  Neck:     Comments: Orally intubated. Cardiovascular:     Comments: HM 3 hum. Pericardial friction rub. Pulmonary:     Effort: He is intubated.     Breath sounds: Normal breath sounds.  Chest:     Comments: Midline sternotomy incision.  3 way chest tube approximately 200 mL output over 2 hours. Abdominal:     General: Abdomen is flat. Bowel sounds are decreased.  Genitourinary:    Comments: Foley catheter in place, clear urine. Neurological:     Mental Status: He is unresponsive.       Ancillary tests (personally reviewed)  CBC: Recent Labs  Lab 11/10/20 2224 11/11/20 0503 11/14/20 0343 11/15/20 0515 11/16/20 0512 11/17/20 0232 11/17/20 0819 11/17/20 1149 11/17/20 1156 11/17/20 1224 11/17/20 1244 11/17/20 1311 11/17/20 1314 11/17/20 1503  WBC 13.3*   < > 10.6* 9.7 10.2 11.3*  --   --   --   --   --   --   --  21.6*  NEUTROABS 12.2*  --   --   --   --   --   --   --   --   --   --   --   --   --  HGB 12.3*   < > 11.5* 11.5* 11.4* 10.9*   < > 7.5*   < > 8.2* 9.2* 8.5* 10.2* 7.9*  HCT 36.6*   < > 34.5* 33.0* 35.1* 33.4*   < > 22.9*   < > 24.0* 27.0* 25.0* 30.0* 24.3*  MCV 84.3   < > 84.1 83.5 86.5 86.3  --   --   --   --   --   --   --  87.4  PLT 177   < > 129* 141* 155 175  --  124*  --   --   --   --   --  189  186   < > = values in this interval not displayed.    Basic Metabolic Panel: Recent Labs  Lab 11/10/20 2224 11/11/20 0503 11/12/20 0515 11/14/20 0343 11/15/20 0515 11/16/20 0512 11/17/20 0232 11/17/20 0819 11/17/20 1106 11/17/20 1123 11/17/20 1156 11/17/20 1224 11/17/20 1244 11/17/20 1311 11/17/20 1314 11/17/20 1503  NA 132* 131*   < > 126* 126* 127* 129*   < > 134*   < > 135 136 135 136 136 139  K 3.9 4.0   < > 3.5 3.9 3.8 4.4   < > 3.6   < > 3.7 4.2 4.0 3.6 3.6 3.2*  CL 97* 95*   < > 83* 87* 88* 90*   < > 96*  --  95*  --  96*  --  98 100  CO2 22 23   < > 33* $Rem'29 27 28  'tVMx$ --   --   --   --   --   --   --   --  28  GLUCOSE 211* 232*   < > 260* 299* 395* 336*   < > 195*  --  155*  --  161*  --  147* 114*  BUN 23* 21*   < > $R'16 20 18 15   'zR$ < > 15  --  14  --  16  --  15 12  CREATININE 1.08 0.90   < > 0.95 0.75 0.87  0.88 0.79   < > 0.60*  --  0.50*  --  0.60*  --  0.50* 0.69  CALCIUM 8.4* 8.4*   < > 9.2 8.7* 8.8* 8.9  --   --   --   --   --   --   --   --  8.4*  MG 2.0 2.0  --   --   --   --   --   --   --   --   --   --   --   --   --  2.1   < > = values in this interval not displayed.   GFR: Estimated Creatinine Clearance: 102.7  mL/min (by C-G formula based on SCr of 0.69 mg/dL). Recent Labs  Lab 11/15/20 0515 11/16/20 0512 11/17/20 0232 11/17/20 1503  WBC 9.7 10.2 11.3* 21.6*    Liver Function Tests: Recent Labs  Lab 11/10/20 2224 11/16/20 1426  AST 21 22  ALT 26 20  ALKPHOS 81 81  BILITOT 1.1 1.0  PROT 5.8* 6.4*  ALBUMIN 3.0* 3.0*   No results for input(s): LIPASE, AMYLASE in the last 168 hours. No results for input(s): AMMONIA in the last 168 hours.  ABG    Component Value Date/Time   PHART 7.433 11/17/2020 1311   PCO2ART 43.7 11/17/2020 1311   PO2ART 194 (H) 11/17/2020 1311   HCO3  29.2 (H) 11/17/2020 1311   TCO2 26 11/17/2020 1314   ACIDBASEDEF 6.0 (H) 11/24/2019 1718   O2SAT 59.6 11/17/2020 1505     Coagulation Profile: Recent Labs  Lab 11/10/20 2224 11/17/20 1503  INR 1.1 1.3*  1.3*    Cardiac Enzymes: No results for input(s): CKTOTAL, CKMB, CKMBINDEX, TROPONINI in the last 168 hours.  HbA1C: Hgb A1c MFr Bld  Date/Time Value Ref Range Status  11/10/2020 10:48 PM 10.4 (H) 4.8 - 5.6 % Final    Comment:    (NOTE) Pre diabetes:          5.7%-6.4%  Diabetes:              >6.4%  Glycemic control for   <7.0% adults with diabetes   09/22/2020 09:44 AM 9.4 (H) 4.8 - 5.6 % Final    Comment:    (NOTE)         Prediabetes: 5.7 - 6.4         Diabetes: >6.4         Glycemic control for adults with diabetes: <7.0     CBG: Recent Labs  Lab 11/16/20 2100 11/16/20 2359 11/17/20 0447 11/17/20 1507 11/17/20 1608  GLUCAP 266* 229* 213* 104* 114*    Assessment & Plan:  Critically ill due to expect acute hypoxic respiratory failure following LVAD implantation Critically ill due to mixed distributive and cardiogenic shock following cardiopulmonary bypass on background of known ischemic cardiomyopathy Acute decompensated biventricular failure with EF less than 20% Coronary artery disease History of paroxysmal atrial fibrillation Type 2 diabetes COPD with PFTs indicating  severe airway obstruction  Plan:  -Appears to have achieved adequate postoperative hemostasis. -Acceptable postoperative hemodynamics with relatively normal filling pressures.  The lower side indicating volume contraction.  We will transfuse 1 unit RBC if hemoglobin greater than 9 -Otherwise continue full mechanical ventilatory support -Wean nitric oxide starting at midnight and plan to proceed to extubation tomorrow morning if hemodynamics remain acceptable in a.m. -COPD may limit extubation- we will start bronchodilators.   Daily Goals Checklist  Pain/Anxiety/Delirium protocol (if indicated): Dexmedetomidine, fentanyl for pain VAP protocol (if indicated): Bundle in place Respiratory support goals: Full ventilatory support tonight, rapid wean per protocol tomorrow Blood pressure target: Keep MAP 70-90 DVT prophylaxis: Holding chemical prophylaxis due to bleeding will need full anticoagulation starting tomorrow Nutritional status and feeding goals: High risk.  Initiate feeds tomorrow if remains intubated GI prophylaxis: Pantoprazole Fluid status goals: Allow autoregulation Urinary catheter: Assessment of intravascular volume Central lines: Right IJ introducer with Swan, right radial art Glucose control: Insulin infusion per protocol Mobility/therapy needs: Bedrest Antibiotic de-escalation: Perioperative antibiotics Home medication reconciliation: On hold Daily labs: Per CT surgery protocol Code Status: Full code Family Communication: Cardiac Disposition: ICU  CRITICAL CARE Performed by: Kipp Brood   Total critical care time: 40 minutes  Critical care time was exclusive of separately billable procedures and treating other patients.  Critical care was necessary to treat or prevent imminent or life-threatening deterioration.  Critical care was time spent personally by me on the following activities: development of treatment plan with patient and/or surrogate as well as  nursing, discussions with consultants, evaluation of patient's response to treatment, examination of patient, obtaining history from patient or surrogate, ordering and performing treatments and interventions, ordering and review of laboratory studies, ordering and review of radiographic studies, pulse oximetry, re-evaluation of patient's condition and participation in multidisciplinary rounds.  Kipp Brood, MD [facility] ICU Physician [place]  Pager: [phone] Mobile: [phone] After hours: [phone].    11/17/2020, 4:50 PM

## 2020-11-17 NOTE — Anesthesia Procedure Notes (Signed)
Central Venous Catheter Insertion Performed by: Achille Rich, MD, anesthesiologist Start/End12/09/2020 8:03 AM, 11/17/2020 8:05 AM Patient location: Pre-op. Preanesthetic checklist: patient identified, IV checked, site marked, risks and benefits discussed, surgical consent, monitors and equipment checked, pre-op evaluation, timeout performed and anesthesia consent Hand hygiene performed  and maximum sterile barriers used  PA cath was placed.Swan type:thermodilution Procedure performed without using ultrasound guided technique. Attempts: 1 Patient tolerated the procedure well with no immediate complications.

## 2020-11-17 NOTE — Progress Notes (Signed)
PT bagged from OR to 2H02 on O2 and Nitric with no complications noted.  PT placed on vent.  No issues noted.  RT will continue to monitor.

## 2020-11-17 NOTE — Progress Notes (Signed)
CSW attempted to reach wife to provide support and check in although no answer and unable to leave voicemail. VAD Coordinator did reach wife earlier to update on status in the OR. CSW will continue to follow for supportive needs throughout hospitalization. Lasandra Beech, LCSW, CCSW-MCS [phone]

## 2020-11-17 NOTE — Anesthesia Preprocedure Evaluation (Signed)
Anesthesia Evaluation  Patient identified by MRN, date of birth, ID band Patient awake    Reviewed: Allergy & Precautions, H&P , NPO status , Patient's Chart, lab work & pertinent test results  Airway Mallampati: II   Neck ROM: full   Comment: Pt had teeth extractions yesterday. Dental   Pulmonary sleep apnea , COPD, former smoker,    breath sounds clear to auscultation       Cardiovascular hypertension, + angina + CAD, + CABG and +CHF   Rhythm:regular Rate:Normal  S/p CABG 11/2019. TTE (11/16/2020): LVEF 20%, RV function mildly reduced. Impella VAD currently in place.   Neuro/Psych    GI/Hepatic   Endo/Other  diabetes, Type 2  Renal/GU      Musculoskeletal   Abdominal   Peds  Hematology  (+) anemia ,   Anesthesia Other Findings   Reproductive/Obstetrics                             Anesthesia Physical Anesthesia Plan  ASA: IV  Anesthesia Plan: General   Post-op Pain Management:    Induction: Intravenous  PONV Risk Score and Plan: 2 and Ondansetron, Dexamethasone, Midazolam and Treatment may vary due to age or medical condition  Airway Management Planned: Oral ETT  Additional Equipment: Arterial line, CVP, PA Cath, TEE and Ultrasound Guidance Line Placement  Intra-op Plan:   Post-operative Plan: Post-operative intubation/ventilation  Informed Consent: I have reviewed the patients History and Physical, chart, labs and discussed the procedure including the risks, benefits and alternatives for the proposed anesthesia with the patient or authorized representative who has indicated his/her understanding and acceptance.       Plan Discussed with: CRNA, Anesthesiologist and Surgeon  Anesthesia Plan Comments:         Anesthesia Quick Evaluation

## 2020-11-17 NOTE — Plan of Care (Signed)
?  Problem: Education: ?Goal: Knowledge of General Education information will improve ?Description: Including pain rating scale, medication(s)/side effects and non-pharmacologic comfort measures ?Outcome: Progressing ?  ?Problem: Health Behavior/Discharge Planning: ?Goal: Ability to manage health-related needs will improve ?Outcome: Progressing ?  ?Problem: Clinical Measurements: ?Goal: Ability to maintain clinical measurements within normal limits will improve ?Outcome: Progressing ?Goal: Will remain free from infection ?Outcome: Progressing ?Goal: Diagnostic test results will improve ?Outcome: Progressing ?Goal: Respiratory complications will improve ?Outcome: Progressing ?Goal: Cardiovascular complication will be avoided ?Outcome: Progressing ?  ?Problem: Activity: ?Goal: Risk for activity intolerance will decrease ?Outcome: Progressing ?  ?Problem: Nutrition: ?Goal: Adequate nutrition will be maintained ?Outcome: Progressing ?  ?Problem: Coping: ?Goal: Level of anxiety will decrease ?Outcome: Progressing ?  ?Problem: Elimination: ?Goal: Will not experience complications related to bowel motility ?Outcome: Progressing ?Goal: Will not experience complications related to urinary retention ?Outcome: Progressing ?  ?Problem: Pain Managment: ?Goal: General experience of comfort will improve ?Outcome: Progressing ?  ?Problem: Safety: ?Goal: Ability to remain free from injury will improve ?Outcome: Progressing ?  ?Problem: Skin Integrity: ?Goal: Risk for impaired skin integrity will decrease ?Outcome: Progressing ?  ?Problem: Education: ?Goal: Understanding of cardiac disease, CV risk reduction, and recovery process will improve ?Outcome: Progressing ?Goal: Individualized Educational Video(s) ?Outcome: Progressing ?  ?Problem: Activity: ?Goal: Ability to tolerate increased activity will improve ?Outcome: Progressing ?  ?Problem: Cardiac: ?Goal: Ability to achieve and maintain adequate cardiovascular perfusion will  improve ?Outcome: Progressing ?  ?Problem: Health Behavior/Discharge Planning: ?Goal: Ability to safely manage health-related needs after discharge will improve ?Outcome: Progressing ?  ?Problem: Education: ?Goal: Understanding of CV disease, CV risk reduction, and recovery process will improve ?Outcome: Progressing ?Goal: Individualized Educational Video(s) ?Outcome: Progressing ?  ?Problem: Activity: ?Goal: Ability to return to baseline activity level will improve ?Outcome: Progressing ?  ?Problem: Cardiovascular: ?Goal: Ability to achieve and maintain adequate cardiovascular perfusion will improve ?Outcome: Progressing ?Goal: Vascular access site(s) Level 0-1 will be maintained ?Outcome: Progressing ?  ?Problem: Health Behavior/Discharge Planning: ?Goal: Ability to safely manage health-related needs after discharge will improve ?Outcome: Progressing ?  ?

## 2020-11-17 NOTE — Addendum Note (Signed)
Addendum  created 11/17/20 1102 by Cecile Hearing, MD   Clinical Note Signed

## 2020-11-17 NOTE — Anesthesia Procedure Notes (Signed)
Arterial Line Insertion Start/End12/09/2020 8:00 AM, 11/17/2020 8:05 AM Performed by: Adria Dill, CRNA, CRNA  Patient location: Pre-op. Preanesthetic checklist: patient identified, IV checked, site marked, risks and benefits discussed, surgical consent, monitors and equipment checked, pre-op evaluation, timeout performed and anesthesia consent Lidocaine 1% used for infiltration Left, radial was placed Catheter size: 20 Fr Hand hygiene performed , maximum sterile barriers used  and Seldinger technique used Allen's test indicative of satisfactory collateral circulation Attempts: 2 Procedure performed without using ultrasound guided technique. Following insertion, dressing applied and Biopatch. Post procedure assessment: normal and unchanged

## 2020-11-17 NOTE — Progress Notes (Signed)
Pharmacy Antibiotic Note  Kevin Underwood is a 59 y.o. male admitted on 11/08/2020. Patient now s/p VAD. New orders to continue vancomycin surgical prophylaxis for 48 hours.   No abx prior to surgery. WBC this morning is 11, renal function is normal.   Plan: Vancomycin 1g IV every 12 hours.  Goal trough 10-15 mcg/mL.  Will check trough is therapy is prolonged   Height: 5\' 10"  (177.8 cm) Weight: 77.9 kg (171 lb 11.8 oz) IBW/kg (Calculated) : 73  Temp (24hrs), Avg:98.3 F (36.8 C), Min:97.7 F (36.5 C), Max:98.5 F (36.9 C)  Recent Labs  Lab 11/13/20 0454 11/13/20 1332 11/14/20 0343 11/15/20 0515 11/16/20 0512 11/17/20 0232 11/17/20 0819 11/17/20 1021 11/17/20 1106 11/17/20 1156 11/17/20 1244 11/17/20 1314  WBC 10.5  --  10.6* 9.7 10.2 11.3*  --   --   --   --   --   --   CREATININE 0.85   < > 0.95 0.75 0.87  0.88 0.79   < > 0.60* 0.60* 0.50* 0.60* 0.50*   < > = values in this interval not displayed.    Estimated Creatinine Clearance: 102.7 mL/min (A) (by C-G formula based on SCr of 0.5 mg/dL (L)).    No Known Allergies  Thank you for allowing pharmacy to be a part of this patient's care.  14/10/21 PharmD., BCPS Clinical Pharmacist 11/17/2020 3:32 PM

## 2020-11-17 NOTE — Anesthesia Procedure Notes (Signed)
Central Venous Catheter Insertion Performed by: Albertha Ghee, MD, anesthesiologist Start/End12/09/2020 7:56 AM, 11/17/2020 8:05 AM Patient location: Pre-op. Preanesthetic checklist: patient identified, IV checked, site marked, risks and benefits discussed, surgical consent, monitors and equipment checked, pre-op evaluation, timeout performed and anesthesia consent Position: Trendelenburg Patient sedated Hand hygiene performed  and maximum sterile barriers used  Catheter size: 9 Fr Central line was placed.MAC introducer Procedure performed using ultrasound guided technique. Ultrasound Notes:anatomy identified, needle tip was noted to be adjacent to the nerve/plexus identified, no ultrasound evidence of intravascular and/or intraneural injection and image(s) printed for medical record Attempts: 1 Following insertion, line sutured and dressing applied. Post procedure assessment: blood return through all ports, free fluid flow and no air  Patient tolerated the procedure well with no immediate complications.

## 2020-11-17 NOTE — Transfer of Care (Signed)
Immediate Anesthesia Transfer of Care Note  Patient: Kevin Underwood  Procedure(s) Performed: REDO STERNOTOMY (N/A Chest) INSERTION OF IMPLANTABLE LEFT VENTRICULAR ASSIST DEVICE - HM3 (N/A Chest) TRANSESOPHAGEAL ECHOCARDIOGRAM (TEE) (N/A )  Patient Location: SICU  Anesthesia Type:General  Level of Consciousness: Patient remains intubated per anesthesia plan  Airway & Oxygen Therapy: Patient remains intubated per anesthesia plan  Post-op Assessment: Report given to RN and Post -op Vital signs reviewed and stable  Post vital signs: Reviewed and stable  Last Vitals:  Vitals Value Taken Time  BP 100/87 11/17/20 1500  Temp    Pulse 79 11/17/20 1500  Resp 12 11/17/20 1500  SpO2 93 % 11/17/20 1500  Vitals shown include unvalidated device data.  Last Pain:  Vitals:   11/17/20 0400  TempSrc: Axillary  PainSc: 2       Patients Stated Pain Goal: 2 (11/16/20 1600)  Complications: No complications documented.

## 2020-11-17 NOTE — Progress Notes (Incomplete)
Overnight progress  Pt stated he rested "well" overnight.Pt was given PRN Restoril dose for sleep. Pt stated he was nervous about scheduled LVAD placement.Pt NPO at midnight. Oral rinses, CHG bath and clip done. Linens changed. No family at bedside overnight. Pt did speak on the phone to his mother and pt's wife.   Oxycodone given x1 Morphine given x1 (complaint of chronic back pain)  NSR 70-80's BP 110's/80's  RA-NC2L 95-100%  Pt afebrile, AOX4. Pulses 2+ and palpable in all extremities. Pt can move all extremities equally/ with patients baseline strength.   Impella in place @ 51 (left Subclavian) P8 Impella Flow 4 Purge Pressure 400-500's Purge flow 12  Drips: Amio @ 30 mg/hr Heparin @ 750 units/hr- stopped at *** Milrinone @ .375 mcg/kg/min  I/O; Out =157ml (void using urinal) In =  Access: Right upper arm duel PICC Left FA PIV  AM labs- K 4.4 H/H 10/33 WBC 11 Coax 54% *** Na 129***

## 2020-11-17 NOTE — Anesthesia Postprocedure Evaluation (Addendum)
Anesthesia Post Note  Patient: Kevin Underwood  Procedure(s) Performed: MULTIPLE EXTRACTION WITH ALVEOLOPLASTY (N/A )     Patient location during evaluation: ICU Anesthesia Type: General Level of consciousness: awake and alert Pain management: pain level controlled Vital Signs Assessment: post-procedure vital signs reviewed and stable Respiratory status: spontaneous breathing, nonlabored ventilation and respiratory function stable Cardiovascular status: blood pressure returned to baseline and stable Postop Assessment: no apparent nausea or vomiting Anesthetic complications: no   No complications documented.  Last Vitals:  Vitals:   11/17/20 0600 11/17/20 0700  BP: 114/84 107/85  Pulse: 72 75  Resp: 14 19  Temp:    SpO2: 96% 100%    Last Pain:  Vitals:   11/17/20 0400  TempSrc: Axillary  PainSc: 2    Pain Goal: Patients Stated Pain Goal: 2 (11/16/20 1600)                 Cecile Hearing

## 2020-11-17 NOTE — Progress Notes (Signed)
RT set up Nitric in the OR at 20 ppm per MD.  Set up placed in line by CRNA.  No complications noted.  RT will continue to monitor.

## 2020-11-17 NOTE — Brief Op Note (Signed)
11/08/2020 - 11/17/2020  2:23 PM  PATIENT:  Kevin Underwood  59 y.o. male  PRE-OPERATIVE DIAGNOSIS:  Congestive Heart Failure  POST-OPERATIVE DIAGNOSIS:  Congestive Heart Failure  PROCEDURE:  Procedure(s): REDO STERNOTOMY (N/A) INSERTION OF IMPLANTABLE LEFT VENTRICULAR ASSIST DEVICE - HM3 (N/A) TRANSESOPHAGEAL ECHOCARDIOGRAM (TEE) (N/A)  SURGEON:  Surgeon(s) and Role:    * [name], MD - Primary    * Kerin Perna, MD - Assisting  PHYSICIAN ASSISTANT:n/a  ASSISTANTS: staff   ANESTHESIA:   general  EBL:  633 mL   BLOOD ADMINISTERED:per anes  DRAINS: 3 Chest Tube(s) in the left pleural space and mediastinum   LOCAL MEDICATIONS USED:  NONE  SPECIMEN:  Source of Specimen:  apical core  DISPOSITION OF SPECIMEN:  PATHOLOGY  COUNTS:  YES  TOURNIQUET:  * No tourniquets in log *  DICTATION: .Note written in EPIC  PLAN OF CARE: Admit to inpatient   PATIENT DISPOSITION:  ICU - intubated and critically ill.   Delay start of Pharmacological VTE agent (>24hrs) due to surgical blood loss or risk of bleeding: yes

## 2020-11-18 ENCOUNTER — Inpatient Hospital Stay (HOSPITAL_COMMUNITY): Payer: 59

## 2020-11-18 DIAGNOSIS — I255 Ischemic cardiomyopathy: Secondary | ICD-10-CM

## 2020-11-18 DIAGNOSIS — I509 Heart failure, unspecified: Secondary | ICD-10-CM

## 2020-11-18 LAB — CBC
HCT: 24.4 % — ABNORMAL LOW (ref 39.0–52.0)
Hemoglobin: 8.2 g/dL — ABNORMAL LOW (ref 13.0–17.0)
MCH: 29.4 pg (ref 26.0–34.0)
MCHC: 33.6 g/dL (ref 30.0–36.0)
MCV: 87.5 fL (ref 80.0–100.0)
Platelets: 161 10*3/uL (ref 150–400)
RBC: 2.79 MIL/uL — ABNORMAL LOW (ref 4.22–5.81)
RDW: 15.8 % — ABNORMAL HIGH (ref 11.5–15.5)
WBC: 12.8 10*3/uL — ABNORMAL HIGH (ref 4.0–10.5)
nRBC: 0.4 % — ABNORMAL HIGH (ref 0.0–0.2)

## 2020-11-18 LAB — BPAM PLATELET PHERESIS
Blood Product Expiration Date: 202112122359
Blood Product Expiration Date: 202112132359
ISSUE DATE / TIME: 202112101225
ISSUE DATE / TIME: 202112101225
Unit Type and Rh: 6200
Unit Type and Rh: 7300

## 2020-11-18 LAB — CBC WITH DIFFERENTIAL/PLATELET
Abs Immature Granulocytes: 0.13 10*3/uL — ABNORMAL HIGH (ref 0.00–0.07)
Basophils Absolute: 0.1 10*3/uL (ref 0.0–0.1)
Basophils Relative: 0 %
Eosinophils Absolute: 0 10*3/uL (ref 0.0–0.5)
Eosinophils Relative: 0 %
HCT: 25.6 % — ABNORMAL LOW (ref 39.0–52.0)
Hemoglobin: 8.4 g/dL — ABNORMAL LOW (ref 13.0–17.0)
Immature Granulocytes: 1 %
Lymphocytes Relative: 11 %
Lymphs Abs: 1.5 10*3/uL (ref 0.7–4.0)
MCH: 28.8 pg (ref 26.0–34.0)
MCHC: 32.8 g/dL (ref 30.0–36.0)
MCV: 87.7 fL (ref 80.0–100.0)
Monocytes Absolute: 2.5 10*3/uL — ABNORMAL HIGH (ref 0.1–1.0)
Monocytes Relative: 19 %
Neutro Abs: 8.9 10*3/uL — ABNORMAL HIGH (ref 1.7–7.7)
Neutrophils Relative %: 69 %
Platelets: 169 10*3/uL (ref 150–400)
RBC: 2.92 MIL/uL — ABNORMAL LOW (ref 4.22–5.81)
RDW: 15.1 % (ref 11.5–15.5)
WBC: 13 10*3/uL — ABNORMAL HIGH (ref 4.0–10.5)
nRBC: 0.4 % — ABNORMAL HIGH (ref 0.0–0.2)

## 2020-11-18 LAB — PREPARE CRYOPRECIPITATE
Unit division: 0
Unit division: 0

## 2020-11-18 LAB — BPAM CRYOPRECIPITATE
Blood Product Expiration Date: 202112101823
Blood Product Expiration Date: 202112101823
ISSUE DATE / TIME: 202112101250
ISSUE DATE / TIME: 202112101250
Unit Type and Rh: 6200
Unit Type and Rh: 6200

## 2020-11-18 LAB — ECHO INTRAOPERATIVE TEE
Height: 70 in
Weight: 2747.81 oz

## 2020-11-18 LAB — GLUCOSE, CAPILLARY
Glucose-Capillary: 102 mg/dL — ABNORMAL HIGH (ref 70–99)
Glucose-Capillary: 103 mg/dL — ABNORMAL HIGH (ref 70–99)
Glucose-Capillary: 112 mg/dL — ABNORMAL HIGH (ref 70–99)
Glucose-Capillary: 119 mg/dL — ABNORMAL HIGH (ref 70–99)
Glucose-Capillary: 122 mg/dL — ABNORMAL HIGH (ref 70–99)
Glucose-Capillary: 123 mg/dL — ABNORMAL HIGH (ref 70–99)
Glucose-Capillary: 123 mg/dL — ABNORMAL HIGH (ref 70–99)
Glucose-Capillary: 124 mg/dL — ABNORMAL HIGH (ref 70–99)
Glucose-Capillary: 132 mg/dL — ABNORMAL HIGH (ref 70–99)
Glucose-Capillary: 139 mg/dL — ABNORMAL HIGH (ref 70–99)
Glucose-Capillary: 82 mg/dL (ref 70–99)
Glucose-Capillary: 92 mg/dL (ref 70–99)

## 2020-11-18 LAB — COMPREHENSIVE METABOLIC PANEL
ALT: 21 U/L (ref 0–44)
AST: 59 U/L — ABNORMAL HIGH (ref 15–41)
Albumin: 3.2 g/dL — ABNORMAL LOW (ref 3.5–5.0)
Alkaline Phosphatase: 44 U/L (ref 38–126)
Anion gap: 10 (ref 5–15)
BUN: 12 mg/dL (ref 6–20)
CO2: 26 mmol/L (ref 22–32)
Calcium: 8.4 mg/dL — ABNORMAL LOW (ref 8.9–10.3)
Chloride: 101 mmol/L (ref 98–111)
Creatinine, Ser: 0.73 mg/dL (ref 0.61–1.24)
GFR, Estimated: >60 mL/min (ref >60)
Glucose, Bld: 118 mg/dL — ABNORMAL HIGH (ref 70–99)
Potassium: 4.1 mmol/L (ref 3.5–5.1)
Sodium: 137 mmol/L (ref 135–145)
Total Bilirubin: 1.2 mg/dL (ref 0.3–1.2)
Total Protein: 5.2 g/dL — ABNORMAL LOW (ref 6.5–8.1)

## 2020-11-18 LAB — BASIC METABOLIC PANEL
Anion gap: 8 (ref 5–15)
BUN: 12 mg/dL (ref 6–20)
CO2: 27 mmol/L (ref 22–32)
Calcium: 8.6 mg/dL — ABNORMAL LOW (ref 8.9–10.3)
Chloride: 99 mmol/L (ref 98–111)
Creatinine, Ser: 0.72 mg/dL (ref 0.61–1.24)
GFR, Estimated: >60 mL/min (ref >60)
Glucose, Bld: 98 mg/dL (ref 70–99)
Potassium: 3.8 mmol/L (ref 3.5–5.1)
Sodium: 134 mmol/L — ABNORMAL LOW (ref 135–145)

## 2020-11-18 LAB — PREPARE PLATELET PHERESIS
Unit division: 0
Unit division: 0

## 2020-11-18 LAB — COOXEMETRY PANEL
Carboxyhemoglobin: 1.5 % (ref 0.5–1.5)
Methemoglobin: 1.3 % (ref 0.0–1.5)
O2 Saturation: 61 %
Total hemoglobin: 8.6 g/dL — ABNORMAL LOW (ref 12.0–16.0)

## 2020-11-18 LAB — POCT I-STAT 7, (LYTES, BLD GAS, ICA,H+H)
Acid-Base Excess: 4 mmol/L — ABNORMAL HIGH (ref 0.0–2.0)
Acid-Base Excess: 4 mmol/L — ABNORMAL HIGH (ref 0.0–2.0)
Bicarbonate: 29.1 mmol/L — ABNORMAL HIGH (ref 20.0–28.0)
Bicarbonate: 28 mmol/L (ref 20.0–28.0)
Calcium, Ion: 1.18 mmol/L (ref 1.15–1.40)
Calcium, Ion: 1.2 mmol/L (ref 1.15–1.40)
HCT: 23 % — ABNORMAL LOW (ref 39.0–52.0)
HCT: 22 % — ABNORMAL LOW (ref 39.0–52.0)
Hemoglobin: 7.8 g/dL — ABNORMAL LOW (ref 13.0–17.0)
Hemoglobin: 7.5 g/dL — ABNORMAL LOW (ref 13.0–17.0)
O2 Saturation: 98 %
O2 Saturation: 97 %
Patient temperature: 37.2
Patient temperature: 37.4
Potassium: 4.1 mmol/L (ref 3.5–5.1)
Potassium: 3.8 mmol/L (ref 3.5–5.1)
Sodium: 137 mmol/L (ref 135–145)
Sodium: 135 mmol/L (ref 135–145)
TCO2: 30 mmol/L (ref 22–32)
TCO2: 29 mmol/L (ref 22–32)
pCO2 arterial: 43.4 mmHg (ref 32.0–48.0)
pCO2 arterial: 41 mmHg (ref 32.0–48.0)
pH, Arterial: 7.435 (ref 7.350–7.450)
pH, Arterial: 7.444 (ref 7.350–7.450)
pO2, Arterial: 107 mmHg (ref 83.0–108.0)
pO2, Arterial: 87 mmHg (ref 83.0–108.0)

## 2020-11-18 LAB — CALCIUM, IONIZED: Calcium, Ionized, Serum: 4.8 mg/dL (ref 4.5–5.6)

## 2020-11-18 LAB — MAGNESIUM
Magnesium: 2.3 mg/dL (ref 1.7–2.4)
Magnesium: 2.3 mg/dL (ref 1.7–2.4)

## 2020-11-18 LAB — PHOSPHORUS: Phosphorus: 4.6 mg/dL (ref 2.5–4.6)

## 2020-11-18 LAB — PROTIME-INR
INR: 1.2 (ref 0.8–1.2)
Prothrombin Time: 15.2 seconds (ref 11.4–15.2)

## 2020-11-18 LAB — LACTATE DEHYDROGENASE: LDH: 356 U/L — ABNORMAL HIGH (ref 98–192)

## 2020-11-18 LAB — BRAIN NATRIURETIC PEPTIDE: B Natriuretic Peptide: 392 pg/mL — ABNORMAL HIGH (ref 0.0–100.0)

## 2020-11-18 MED ORDER — ADULT MULTIVITAMIN W/MINERALS CH
1.0000 | ORAL_TABLET | Freq: Every day | ORAL | Status: DC
  Administered 2020-11-19 – 2020-11-28 (×10): 1 via ORAL
  Filled 2020-11-18 (×10): qty 1

## 2020-11-18 MED ORDER — CHLORHEXIDINE GLUCONATE 0.12 % MT SOLN
OROMUCOSAL | Status: AC
  Administered 2020-11-18: 08:00:00 15 mL via OROMUCOSAL
  Filled 2020-11-18: qty 15

## 2020-11-18 MED ORDER — COLCHICINE 0.3 MG HALF TABLET
0.3000 mg | ORAL_TABLET | Freq: Two times a day (BID) | ORAL | Status: DC
  Administered 2020-11-18: 09:00:00 0.3 mg
  Filled 2020-11-18 (×2): qty 1

## 2020-11-18 MED ORDER — PANTOPRAZOLE SODIUM 40 MG PO PACK: 40.0000 mg | PACK | Freq: Every day | ORAL | Status: DC

## 2020-11-18 MED ORDER — OXYCODONE HCL 5 MG/5ML PO SOLN
5.0000 mg | ORAL | Status: DC | PRN
  Administered 2020-11-18: 5 mg
  Administered 2020-11-18: 10 mg
  Filled 2020-11-18 (×2): qty 10

## 2020-11-18 MED ORDER — KETOROLAC TROMETHAMINE 15 MG/ML IJ SOLN
7.5000 mg | Freq: Four times a day (QID) | INTRAMUSCULAR | Status: AC
  Administered 2020-11-18 – 2020-11-19 (×5): 7.5 mg via INTRAVENOUS
  Filled 2020-11-18 (×5): qty 1

## 2020-11-18 MED ORDER — TRAMADOL HCL 50 MG PO TABS
50.0000 mg | ORAL_TABLET | ORAL | Status: DC | PRN
  Administered 2020-11-19 – 2020-11-26 (×18): 100 mg via ORAL
  Filled 2020-11-18 (×18): qty 2

## 2020-11-18 MED ORDER — ACETAMINOPHEN 325 MG PO TABS
650.0000 mg | ORAL_TABLET | Freq: Four times a day (QID) | ORAL | Status: DC | PRN
  Administered 2020-11-19 – 2020-11-27 (×5): 650 mg via ORAL
  Filled 2020-11-18 (×5): qty 2

## 2020-11-18 MED ORDER — IPRATROPIUM-ALBUTEROL 0.5-2.5 (3) MG/3ML IN SOLN
3.0000 mL | Freq: Two times a day (BID) | RESPIRATORY_TRACT | Status: DC
  Administered 2020-11-18 – 2020-11-28 (×20): 3 mL via RESPIRATORY_TRACT
  Filled 2020-11-18 (×20): qty 3

## 2020-11-18 MED ORDER — ORAL CARE MOUTH RINSE
15.0000 mL | Freq: Two times a day (BID) | OROMUCOSAL | Status: DC
  Administered 2020-11-18 – 2020-11-21 (×4): 15 mL via OROMUCOSAL

## 2020-11-18 MED ORDER — DOCUSATE SODIUM 50 MG/5ML PO LIQD
200.0000 mg | Freq: Every day | ORAL | Status: DC
  Administered 2020-11-18: 09:00:00 200 mg

## 2020-11-18 MED ORDER — ROSUVASTATIN CALCIUM 20 MG PO TABS
40.0000 mg | ORAL_TABLET | Freq: Every day | ORAL | Status: DC
  Administered 2020-11-18: 18:00:00 40 mg
  Filled 2020-11-18: qty 2

## 2020-11-18 MED ORDER — DOCUSATE SODIUM 100 MG PO CAPS
200.0000 mg | ORAL_CAPSULE | Freq: Every day | ORAL | Status: DC
  Administered 2020-11-19 – 2020-11-26 (×6): 200 mg via ORAL
  Filled 2020-11-18 (×9): qty 2

## 2020-11-18 MED ORDER — INSULIN DETEMIR 100 UNIT/ML ~~LOC~~ SOLN
10.0000 [IU] | Freq: Two times a day (BID) | SUBCUTANEOUS | Status: DC
  Administered 2020-11-18 – 2020-11-22 (×9): 10 [IU] via SUBCUTANEOUS
  Filled 2020-11-18 (×10): qty 0.1

## 2020-11-18 MED ORDER — PANTOPRAZOLE SODIUM 40 MG PO TBEC
40.0000 mg | DELAYED_RELEASE_TABLET | Freq: Every day | ORAL | Status: DC
  Administered 2020-11-18 – 2020-11-27 (×10): 40 mg via ORAL
  Filled 2020-11-18 (×10): qty 1

## 2020-11-18 MED ORDER — TRAMADOL HCL 50 MG PO TABS: 50.0000 mg | ORAL_TABLET | ORAL | Status: DC | PRN

## 2020-11-18 MED ORDER — ADULT MULTIVITAMIN LIQUID CH
15.0000 mL | Freq: Every day | ORAL | Status: DC
  Filled 2020-11-18: qty 15

## 2020-11-18 MED ORDER — DIAZEPAM 2 MG PO TABS
2.0000 mg | ORAL_TABLET | Freq: Three times a day (TID) | ORAL | Status: DC
  Administered 2020-11-18 (×2): 2 mg
  Filled 2020-11-18 (×2): qty 1

## 2020-11-18 MED ORDER — INSULIN ASPART 100 UNIT/ML ~~LOC~~ SOLN
2.0000 [IU] | SUBCUTANEOUS | Status: DC
  Administered 2020-11-18 – 2020-11-19 (×2): 2 [IU] via SUBCUTANEOUS
  Administered 2020-11-19: 12:00:00 4 [IU] via SUBCUTANEOUS
  Administered 2020-11-20 (×2): 2 [IU] via SUBCUTANEOUS
  Administered 2020-11-21 (×3): 4 [IU] via SUBCUTANEOUS
  Administered 2020-11-21: 2 [IU] via SUBCUTANEOUS
  Administered 2020-11-21 – 2020-11-22 (×3): 4 [IU] via SUBCUTANEOUS

## 2020-11-18 MED ORDER — COLCHICINE 0.3 MG HALF TABLET
0.3000 mg | ORAL_TABLET | Freq: Two times a day (BID) | ORAL | Status: DC
  Administered 2020-11-18 – 2020-11-19 (×3): 0.3 mg via ORAL
  Filled 2020-11-18 (×4): qty 1

## 2020-11-18 MED ORDER — HALOPERIDOL LACTATE 5 MG/ML IJ SOLN
2.0000 mg | Freq: Four times a day (QID) | INTRAMUSCULAR | Status: DC | PRN
  Administered 2020-11-18 – 2020-11-23 (×5): 2 mg via INTRAVENOUS
  Filled 2020-11-18 (×5): qty 1

## 2020-11-18 MED ORDER — KETOROLAC TROMETHAMINE 15 MG/ML IJ SOLN
7.5000 mg | Freq: Four times a day (QID) | INTRAMUSCULAR | Status: DC
  Administered 2020-11-18 (×2): 7.5 mg via INTRAVENOUS
  Filled 2020-11-18 (×2): qty 1

## 2020-11-18 MED ORDER — DEXMEDETOMIDINE HCL IN NACL 400 MCG/100ML IV SOLN
0.0000 ug/kg/h | INTRAVENOUS | Status: DC
  Administered 2020-11-18 – 2020-11-19 (×2): 0.7 ug/kg/h via INTRAVENOUS
  Filled 2020-11-18 (×2): qty 100

## 2020-11-18 MED ORDER — OXYCODONE HCL 5 MG PO TABS
5.0000 mg | ORAL_TABLET | ORAL | Status: DC | PRN
  Administered 2020-11-18 – 2020-11-27 (×35): 5 mg via ORAL
  Filled 2020-11-18 (×35): qty 1

## 2020-11-18 MED ORDER — ROSUVASTATIN CALCIUM 20 MG PO TABS
40.0000 mg | ORAL_TABLET | Freq: Every day | ORAL | Status: DC
  Administered 2020-11-19 – 2020-11-27 (×9): 40 mg via ORAL
  Filled 2020-11-18 (×9): qty 2

## 2020-11-18 MED ORDER — FUROSEMIDE 10 MG/ML IJ SOLN
40.0000 mg | Freq: Two times a day (BID) | INTRAMUSCULAR | Status: DC
  Administered 2020-11-18 (×2): 40 mg via INTRAVENOUS
  Filled 2020-11-18 (×2): qty 4

## 2020-11-18 MED ORDER — CHLORHEXIDINE GLUCONATE 0.12 % MT SOLN
15.0000 mL | Freq: Two times a day (BID) | OROMUCOSAL | Status: DC
  Administered 2020-11-18 – 2020-11-21 (×6): 15 mL via OROMUCOSAL
  Filled 2020-11-18 (×6): qty 15

## 2020-11-18 MED ORDER — ACETAMINOPHEN 160 MG/5ML PO SOLN: 650.0000 mg | ORAL | Status: DC | PRN

## 2020-11-18 MED ORDER — POTASSIUM CHLORIDE 10 MEQ/50ML IV SOLN
10.0000 meq | INTRAVENOUS | Status: AC
  Administered 2020-11-18 (×2): 10 meq via INTRAVENOUS
  Filled 2020-11-18 (×2): qty 50

## 2020-11-18 NOTE — Progress Notes (Signed)
1 Day Post-Op Procedure(s) (LRB): REDO STERNOTOMY (N/A) INSERTION OF IMPLANTABLE LEFT VENTRICULAR ASSIST DEVICE - HM3 (N/A) TRANSESOPHAGEAL ECHOCARDIOGRAM (TEE) (N/A) Subjective: Intubated/ sedated  Objective: Vital signs in last 24 hours: Temp:  [97.5 F (36.4 C)-99.14 F (37.3 C)] 98.96 F (37.2 C) (12/11 0745) Pulse Rate:  [37-92] 79 (12/11 0445) Cardiac Rhythm: Normal sinus rhythm (12/11 0400) Resp:  [12-22] 17 (12/11 0745) BP: (73-109)/(54-87) 101/75 (12/11 0800) SpO2:  [70 %-100 %] 100 % (12/11 0745) Arterial Line BP: (66-109)/(54-80) 103/74 (12/11 0745) FiO2 (%):  [40 %-50 %] 40 % (12/11 0630) Weight:  [86.2 kg] 86.2 kg (12/11 0343)  Hemodynamic parameters for last 24 hours: PAP: (20-45)/(8-19) 38/14 CVP:  [6 mmHg-15 mmHg] 6 mmHg CO:  [3.6 L/min-5.9 L/min] 5.9 L/min CI:  [1.9 L/min/m2-3 L/min/m2] 3 L/min/m2  Intake/Output from previous day: 12/10 0701 - 12/11 0700 In: 11240.3 [I.V.:5739.2; Blood:2501; NG/GT:60; IV Piggyback:2905.1] Out: 3993 [Urine:2355; Emesis/NG output:200; Drains:49; Blood:633; Chest Tube:756] Intake/Output this shift: Total I/O In: 70 [NG/GT:70] Out: -   General appearance: alert and cooperative Neurologic: intact Heart: regular rate and rhythm, S1, S2 normal, no murmur, click, rub or gallop Lungs: clear to auscultation bilaterally Abdomen: soft, non-tender; bowel sounds normal; no masses,  no organomegaly Extremities: extremities normal, atraumatic, no cyanosis or edema Wound: dressed  Lab Results: Recent Labs    11/17/20 2013 11/17/20 2113 11/18/20 0327 11/18/20 0334  WBC 13.6*  --  13.0*  --   HGB 9.1*   < > 8.4* 7.8*  HCT 26.1*   < > 25.6* 23.0*  PLT 169  --  169  --    < > = values in this interval not displayed.   BMET:  Recent Labs    11/17/20 2013 11/17/20 2113 11/18/20 0327 11/18/20 0334  NA 136   < > 137 137  K 4.1   < > 4.1 4.1  CL 100  --  101  --   CO2 26  --  26  --   GLUCOSE 104*  --  118*  --   BUN 13   --  12  --   CREATININE 0.69  --  0.73  --   CALCIUM 9.0  --  8.4*  --    < > = values in this interval not displayed.    PT/INR:  Recent Labs    11/18/20 0327  LABPROT 15.2  INR 1.2   ABG    Component Value Date/Time   PHART 7.435 11/18/2020 0334   HCO3 29.1 (H) 11/18/2020 0334   TCO2 30 11/18/2020 0334   ACIDBASEDEF 6.0 (H) 11/24/2019 1718   O2SAT 98.0 11/18/2020 0334   CBG (last 3)  Recent Labs    11/18/20 0429 11/18/20 0530 11/18/20 0621  GLUCAP 124* 119* 123*    Assessment/Plan: S/P Procedure(s) (LRB): REDO STERNOTOMY (N/A) INSERTION OF IMPLANTABLE LEFT VENTRICULAR ASSIST DEVICE - HM3 (N/A) TRANSESOPHAGEAL ECHOCARDIOGRAM (TEE) (N/A) Mobilize Diuresis extubate  Wean epi to off Keep milrinone   LOS: 10 days    Kevin Underwood 11/18/2020

## 2020-11-18 NOTE — Progress Notes (Signed)
VAD dressing change note:  VAD dressing changed via protocol using sterile technique by this RN. Site clean, dry, and intact without drainage or erythema noted.   Next Dressing change due 11/19/2020

## 2020-11-18 NOTE — Progress Notes (Signed)
Patient ID: Kevin Underwood, male   DOB: [birth year], 59 y.o.   MRN: [id number]     Advanced Heart Failure Rounding Note  PCP-Cardiologist: Rozann Lesches, MD   Subjective:    - 12/2 Had cath as noted below with intact revascularization. CO-OX 39%, started on milrinone.  - 12/3 Impella 5.5 placed - 12/6 Impella speed increased to P8. Started on lasix drip and given metolazone. Brisk diuresis noted. Negative 5 liters.  - 12/8 Swan removed, PICC placed pre-LVAD.  - 12/11 Impella removed, Heartmate 3 LVAD placed.   POD #1.  Patient stable this morning, on epinephrine 5, NE 5, milrinone 0.25, amiodarone 30.  NO 5.4 ppm. Weight up 19 lbs post-op.  MAP stable in 80s.  Creatinine 0.73.   Swan CVP 11 PA 42/17 CI 2.8 Co-ox 61%  RHC/LHC 11/09/20  RA 20  PCWP 30-35 PA Sat 40% CO 3 CI 1.5.  1. Severe native coronary artery disease, similar to prior catheterization in 11/2019.  2. Widely patent LIMA-LAD, SVG-D1, and sequential SVG-OM3-rPDA. 3. Severely elevated left and right heart filling pressures. 4. Severely reduced cardiac output/index. 5. Successful placement of leave-in PA catheter via the right internal jugular vein.  LVAD Interrogation HM 3: Speed: 5400 Flow: 4 PI: 3.2 Power: 3.7.  LDH 356  Objective:   Weight Range: 86.2 kg Body mass index is 27.27 kg/m.   Vital Signs:   Temp:  [97.5 F (36.4 C)-99.14 F (37.3 C)] 98.96 F (37.2 C) (12/11 0700) Pulse Rate:  [37-92] 79 (12/11 0445) Resp:  [12-22] 16 (12/11 0700) BP: (73-109)/(54-87) 102/72 (12/11 0600) SpO2:  [70 %-100 %] 100 % (12/11 0700) Arterial Line BP: (66-109)/(54-80) 98/73 (12/11 0700) FiO2 (%):  [40 %-50 %] 40 % (12/11 0630) Weight:  [86.2 kg] 86.2 kg (12/11 0343) Last BM Date: 11/11/20  Weight change: Filed Weights   11/16/20 0627 11/17/20 0444 11/18/20 0343  Weight: 78.5 kg 77.9 kg 86.2 kg    Intake/Output:   Intake/Output Summary (Last 24 hours) at 11/18/2020 0743 Last data filed at  11/18/2020 0700 Gross per 24 hour  Intake 11240.29 ml  Output 3993 ml  Net 7247.29 ml      Physical Exam  CVP 11 General: Intubated, sedated.  HEENT: Normal. Neck: Supple, JVP 10 cm. Carotids OK.  Cardiac:  Mechanical heart sounds with LVAD hum present.  Lungs:  CTAB, normal effort.  Abdomen:  NT, ND, no HSM. No bruits or masses. +BS  LVAD exit site: Well-healed and incorporated. Dressing dry and intact. No erythema or drainage. Stabilization device present and accurately applied. Driveline dressing changed daily per sterile technique. Extremities:  Warm and dry. No cyanosis, clubbing, rash. Trace ankle edema.  Neuro:  Sedated on vent, per nursing will awaken/follow commands.    Telemetry   NSR 70s    Labs    CBC Recent Labs    11/17/20 2013 11/17/20 2113 11/18/20 0327 11/18/20 0334  WBC 13.6*  --  13.0*  --   NEUTROABS  --   --  8.9*  --   HGB 9.1*   < > 8.4* 7.8*  HCT 26.1*   < > 25.6* 23.0*  MCV 85.9  --  87.7  --   PLT 169  --  169  --    < > = values in this interval not displayed.   Basic Metabolic Panel Recent Labs    11/17/20 2013 11/17/20 2113 11/18/20 0327 11/18/20 0334  NA 136   < > 137 137  K 4.1   < > 4.1 4.1  CL 100  --  101  --   CO2 26  --  26  --   GLUCOSE 104*  --  118*  --   BUN 13  --  12  --   CREATININE 0.69  --  0.73  --   CALCIUM 9.0  --  8.4*  --   MG 2.6*  --  2.3  --   PHOS  --   --  4.6  --    < > = values in this interval not displayed.   Liver Function Tests Recent Labs    11/16/20 1426 11/18/20 0327  AST 22 59*  ALT 20 21  ALKPHOS 81 44  BILITOT 1.0 1.2  PROT 6.4* 5.2*  ALBUMIN 3.0* 3.2*   No results for input(s): LIPASE, AMYLASE in the last 72 hours. Cardiac Enzymes No results for input(s): CKTOTAL, CKMB, CKMBINDEX, TROPONINI in the last 72 hours.  BNP: BNP (last 3 results) Recent Labs    09/22/20 0348 11/08/20 1344 11/18/20 0327  BNP 939.0* 910.0* 392.0*    ProBNP (last 3 results) No results for  input(s): PROBNP in the last 8760 hours.   D-Dimer Recent Labs    11/17/20 1503  DDIMER 2.01*   Hemoglobin A1C No results for input(s): HGBA1C in the last 72 hours. Fasting Lipid Panel Recent Labs    11/17/20 2013  TRIG 44   Thyroid Function Tests No results for input(s): TSH, T4TOTAL, T3FREE, THYROIDAB in the last 72 hours.  Invalid input(s): FREET3  Other results:   Imaging    DG Chest Port 1 View  Result Date: 11/17/2020 CLINICAL DATA:  History of open heart surgery. EXAM: PORTABLE CHEST 1 VIEW COMPARISON:  11/16/2020 and prior FINDINGS: Right IJ approach Swan-Ganz catheter tip overlies the proximal right main PA. Postsurgical appearance of the cardiomediastinal silhouette with interval placement of LVAD. Stable cardiomegaly. Mediastinal and left chest tubes. No pneumothorax or pleural effusion. Diffuse interstitial prominence. ETT is approximately 2.3 cm above the carina. Non weighted enteric tube side hole overlies the gastric body. Tip outside field of view. IMPRESSION: Postsurgical appearance of the chest with interval LVAD placement. Support lines and tubes as detailed above. Stable cardiomegaly.  No focal consolidation or pneumothorax. Electronically Signed   By: Primitivo Gauze M.D.   On: 11/17/2020 16:35     Medications:     Scheduled Medications: . aspirin EC  325 mg Oral Daily   Or  . aspirin  324 mg Per Tube Daily   Or  . aspirin  300 mg Rectal Daily  . bisacodyl  10 mg Oral Daily   Or  . bisacodyl  10 mg Rectal Daily  . chlorhexidine gluconate (MEDLINE KIT)  15 mL Mouth Rinse BID  . Chlorhexidine Gluconate Cloth  6 each Topical Daily  . docusate sodium  200 mg Oral Daily  . furosemide  40 mg Intravenous BID  . ipratropium-albuterol  3 mL Nebulization Q6H  . mouth rinse  15 mL Mouth Rinse 10 times per day  . mometasone-formoterol  2 puff Inhalation BID  . multivitamin with minerals  1 tablet Oral Daily  . [START ON 11/19/2020] pantoprazole  40  mg Oral Daily  . rosuvastatin  40 mg Oral q1800  . sodium chloride flush  10-40 mL Intracatheter Q12H  . sodium chloride flush  10-40 mL Intracatheter Q12H  . sodium chloride flush  3 mL Intravenous Q12H    Infusions: .  sodium chloride Stopped (11/18/20 0558)  . sodium chloride    . sodium chloride 10 mL/hr at 11/18/20 0700  . amiodarone 30 mg/hr (11/18/20 0700)  . cefUROXime (ZINACEF)  IV 200 mL/hr at 11/18/20 0700  . dexmedetomidine (PRECEDEX) IV infusion Stopped (11/17/20 1848)  . epinephrine 5 mcg/min (11/18/20 0700)  . fluconazole (DIFLUCAN) IV 100 mL/hr at 11/18/20 0700  . insulin 2 mL/hr at 11/18/20 0700  . lactated ringers 10 mL/hr at 11/17/20 0700  . lactated ringers    . lactated ringers 20 mL/hr at 11/18/20 0700  . milrinone 0.25 mcg/kg/min (11/18/20 0700)  . nitroGLYCERIN 0 mcg/min (11/17/20 1445)  . norepinephrine (LEVOPHED) Adult infusion 5 mcg/min (11/18/20 0700)  . potassium chloride    . propofol (DIPRIVAN) infusion 75 mcg/kg/min (11/18/20 0700)  . rifampin (RIFADIN) IVPB    . vancomycin Stopped (11/17/20 2126)  . 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 type 2 diabetes mellitus, COPD, and hypertension. Now s/p Heartmate 3 LVAD.   Assessment/Plan   1. Acute on chronic systolic CHF with cardiogenic shock: Echo 10/21 with EF 20-25%, mildly decreased RV function (persistently low). LHC/RHC this admission with patent grafts, low output.  Suspect mixed ischemic/nonischemic cardiomyopathy (prior heavy ETOH and drugs as well as CAD).  No ETOH, drugs, smoking for about 1 year since CABG in 12/20. NYHA class IV at admission.  Had placement of Impella 5.5 initially, now s/p Heartmate 3 LVAD on 12/10.  This morning, remains on epinephrine 5, norepinephrine 5, milrinone 0.25, NO 5.4 ppm.  CI 2.8 by Luiz Blare with co-ox 61%, CVP 11 with weight up 19 lbs.  - Wean norepinephrine, then slow wean of epinephrine (would not stop entirely today).  - Lasix 40 mg IV bid today, follow response.  - Continue ASA 325 mg daily.  Begin warfarin when stable for this per surgeon.   - Continue milrinone 0.375.  2. Acute hypoxemic respiratory failure: Intubated s/p LVAD surgery.  FiO2 0.4, will wake up per nursing.  - Wean off NO today.  - Extubation hopefully today.   3. CAD: S/p CABG 12/20.  LHC this admission with patent grafts, no target for intervention.  - Continue ASA and statin.  4. Atrial fibrillation: Paroxysmal.  S/p Maze and LA appendage clip with CABG in 12/20.  In NSR.  - Continue amiodarone gtt post-op.  - Start anticoagulation when ok per surgery.  5. Type 2 diabetes: A1c 10.4, poor control.  Continue insulin.  6. Prior ETOH, drugs: None x 1 year.  7. Anemia: Post-op, transfuse hgb < 8.    8. COPD: PFTs showed FEV1 1.79 (limited study due to LVAD).   CRITICAL CARE Performed by: Loralie Champagne  Total critical care time: 40 minutes  Critical care time was exclusive of separately billable procedures and treating other patients.  Critical care was necessary to treat or prevent imminent or life-threatening deterioration.  Critical care was time spent personally by me on the following activities: development of treatment plan with patient and/or surrogate as well as nursing, discussions with consultants, evaluation of  patient's response to treatment, examination of patient, obtaining history from patient or surrogate, ordering and performing  treatments and interventions, ordering and review of laboratory studies, ordering and review of radiographic studies, pulse oximetry and re-evaluation of patient's condition.    Length of Stay: [place], MD  11/18/2020, 7:43 AM  Advanced Heart Failure Team Pager [phone] (M-F; 7a - 4p)  Please contact [facility] for night-coverage after hours (4p -7a ) and weekends on [email or web address]

## 2020-11-18 NOTE — Procedures (Signed)
Extubation Procedure Note  Patient Details:   Name: JERMELL HOLEMAN DOB: Jun 18, 1961 MRN: [id number]   Airway Documentation:    Vent end date: 11/18/20 Vent end time: 1335   Evaluation  O2 sats: stable throughout Complications: No apparent complications Patient did tolerate procedure well. Bilateral Breath Sounds: Clear   Yes  Murial Beam 11/18/2020, 1:37 PM

## 2020-11-18 NOTE — Anesthesia Postprocedure Evaluation (Signed)
Anesthesia Post Note  Patient: Kevin Underwood  Procedure(s) Performed: REDO STERNOTOMY (N/A Chest) INSERTION OF IMPLANTABLE LEFT VENTRICULAR ASSIST DEVICE - HM3 (N/A Chest) TRANSESOPHAGEAL ECHOCARDIOGRAM (TEE) (N/A )     Patient location during evaluation: SICU Anesthesia Type: General Level of consciousness: sedated Pain management: pain level controlled Vital Signs Assessment: post-procedure vital signs reviewed and stable Respiratory status: patient remains intubated per anesthesia plan Cardiovascular status: stable Postop Assessment: no apparent nausea or vomiting Anesthetic complications: no   No complications documented.  Last Vitals:  Vitals:   11/18/20 0815 11/18/20 0830  BP:    Pulse:    Resp: 17 19  Temp: 37.1 C 37.2 C  SpO2: 100% 100%    Last Pain:  Vitals:   11/17/20 1909  TempSrc: Core  PainSc:                  Terin Cragle S

## 2020-11-18 NOTE — Progress Notes (Signed)
NAME:  Kevin Underwood, MRN:  [id number], DOB:  12/27/1960, LOS: 42 ADMISSION DATE:  11/08/2020, CONSULTATION DATE:  11/17/2020 REFERRING MD: Orvan Seen, CHIEF COMPLAINT: Status post VAD implantation  HPI/course in hospital  59 year old man with history of ischemic cardiomyopathy presented 12/1 with shortness of breath and chest tightness.  Increasing weight, increasing abdominal girth and poor appetite.  Known ischemic cardiomyopathy with prior three-vessel bypass 1 year ago requiring perioperative Impella support.  Right heart catheterization showing a low SCV O2 and cardiac index.  Impella 5.5 inserted for preoperative optimization, diuresed.  Hemodynamics improved  Underwent implantation HeartMate 3 LVAD.  Received 4 L of fluid intraoperatively.  Came off bypass uneventfully  Past Medical History   Past Medical History:  Diagnosis Date  . Arthritis   . CAD (coronary artery disease)    a. s/p CABG in 11/2019 with LIMA-LAD, SVG-OM1, SVG-PDA and SVG-D1  . CHF (congestive heart failure) ([place])    a. EF < 20% by echo in 11/2019  . COPD (chronic obstructive pulmonary disease) ([place])   . Essential hypertension   . PAF (paroxysmal atrial fibrillation) ([place])   . Type 2 diabetes mellitus ([place])      Past Surgical History:  Procedure Laterality Date  . BACK SURGERY    . CLIPPING OF ATRIAL APPENDAGE N/A 11/26/2019   Procedure: Clipping Of Atrial Appendage using AtriCure 40 Clip;  Surgeon: Wonda Olds, MD;  Location: [place];  Service: Open Heart Surgery;  Laterality: N/A;  . CORONARY ARTERY BYPASS GRAFT N/A 11/26/2019   Procedure: CORONARY ARTERY BYPASS GRAFTING (CABG) using endoscopic greater saphenous vein harvest: svc to OM; svc to Diag; svc to PD; and LIMA to LAD.;  Surgeon: Wonda Olds, MD;  Location: [place];  Service: Open Heart Surgery;  Laterality: N/A;  . FOOT SURGERY    . HAND SURGERY    . INTRAOPERATIVE TRANSESOPHAGEAL ECHOCARDIOGRAM  12/04/2019   Procedure: Intraoperative  Transesophageal Echocardiogram;  Surgeon: Wonda Olds, MD;  Location: [facility];  Service: Open Heart Surgery;;  . MAZE N/A 11/26/2019   Procedure: MAZE using Bilateral Pulmonary Vein isolation.;  Surgeon: Wonda Olds, MD;  Location: [place];  Service: Open Heart Surgery;  Laterality: N/A;  . MULTIPLE EXTRACTIONS WITH ALVEOLOPLASTY N/A 11/16/2020   Procedure: MULTIPLE EXTRACTION WITH ALVEOLOPLASTY;  Surgeon: Charlaine Dalton, DMD;  Location: [place];  Service: Dentistry;  Laterality: N/A;  . PLACEMENT OF IMPELLA LEFT VENTRICULAR ASSIST DEVICE N/A 11/24/2019   Procedure: PLACEMENT OF IMPELLA 5.5 LEFT VENTRICULAR ASSIST DEVICE;  Surgeon: Wonda Olds, MD;  Location: [place];  Service: Open Heart Surgery;  Laterality: N/A;  . PLACEMENT OF IMPELLA LEFT VENTRICULAR ASSIST DEVICE Left 11/10/2020   Procedure: PLACEMENT OF IMPELLA 5.5 LEFT VENTRICULAR ASSIST DEVICE VIA  LEFT AVILLARY ARTERY;  Surgeon: Wonda Olds, MD;  Location: [place];  Service: Open Heart Surgery;  Laterality: Left;  . REMOVAL OF IMPELLA LEFT VENTRICULAR ASSIST DEVICE Right 12/04/2019   Procedure: REMOVAL OF IMPELLA LEFT VENTRICULAR ASSIST DEVICE, right axilla;  Surgeon: Wonda Olds, MD;  Location: [place];  Service: Open Heart Surgery;  Laterality: Right;  . RIGHT/LEFT HEART CATH AND CORONARY ANGIOGRAPHY N/A 11/24/2019   Procedure: RIGHT/LEFT HEART CATH AND CORONARY ANGIOGRAPHY;  Surgeon: Larey Dresser, MD;  Location: [place] [facility];  Service: Cardiovascular;  Laterality: N/A;  . RIGHT/LEFT HEART CATH AND CORONARY/GRAFT ANGIOGRAPHY N/A 11/09/2020   Procedure: RIGHT/LEFT HEART CATH AND CORONARY/GRAFT ANGIOGRAPHY;  Surgeon: Nelva Bush, MD;  Location: [place] [facility] LAB;  Service: Cardiovascular;  Laterality: N/A;  . TEE WITHOUT CARDIOVERSION N/A 11/24/2019   Procedure: TRANSESOPHAGEAL ECHOCARDIOGRAM (TEE);  Surgeon: Wonda Olds, MD;  Location: [place];  Service: Open Heart Surgery;  Laterality: N/A;  . TEE  WITHOUT CARDIOVERSION N/A 11/26/2019   Procedure: TRANSESOPHAGEAL ECHOCARDIOGRAM (TEE);  Surgeon: Wonda Olds, MD;  Location: [place];  Service: Open Heart Surgery;  Laterality: N/A;  . TEE WITHOUT CARDIOVERSION N/A 11/10/2020   Procedure: TRANSESOPHAGEAL ECHOCARDIOGRAM (TEE);  Surgeon: Wonda Olds, MD;  Location: [place];  Service: Open Heart Surgery;  Laterality: N/A;     Review of Systems:   Review of Systems  Unable to perform ROS: Critical illness    Social History   reports that he quit smoking about 10 months ago. His smoking use included cigarettes. He has a 30.00 pack-year smoking history. He has never used smokeless tobacco. He reports current alcohol use. He reports that he does not use drugs.   Family History   His family history includes Arthritis in an other family member; Asthma in an other family member; Diabetes in an other family member; Heart disease in his sister; Lung disease in an other family member.   Allergies No Known Allergies   Home Medications  Prior to Admission medications   Medication Sig Start Date End Date Taking? Authorizing Provider  albuterol (PROVENTIL) (2.5 MG/3ML) 0.083% nebulizer solution Take 2.5 mg by nebulization every 6 (six) hours as needed for wheezing or shortness of breath.  08/25/20  Yes [provider]  aspirin EC 81 MG tablet Take 81 mg by mouth daily. Swallow whole.   Yes [provider]  carvedilol (COREG) 6.25 MG tablet TAKE 1 TABLET(6.25 MG) BY MOUTH TWICE DAILY WITH A MEAL Patient taking differently: Take 6.25 mg by mouth 2 (two) times daily with a meal.  09/29/20  Yes Verta Ellen., NP  clopidogrel (PLAVIX) 75 MG tablet TAKE 1 TABLET(75 MG) BY MOUTH DAILY Patient taking differently: Take 75 mg by mouth once.  09/29/20  Yes Verta Ellen., NP  COMBIVENT RESPIMAT 20-100 MCG/ACT AERS respimat Inhale 1 puff into the lungs every 6 (six) hours as needed. 12/08/19  Yes Gold, Patrick Jupiter E, PA-C   dapagliflozin propanediol (FARXIGA) 5 MG TABS tablet Take 1 tablet (5 mg total) by mouth daily before breakfast. 09/25/20  Yes Barton Dubois, MD  digoxin (LANOXIN) 0.125 MG tablet TAKE 1 TABLET(0.125 MG) BY MOUTH DAILY Patient taking differently: Take 0.125 mg by mouth daily.  09/29/20  Yes Verta Ellen., NP  ENTRESTO 24-26 MG TAKE 1 TABLET BY MOUTH TWICE DAILY 09/29/20  Yes Verta Ellen., NP  furosemide (LASIX) 80 MG tablet Take 0.5-1 tablets (40-80 mg total) by mouth See admin instructions. 80mg  in the am 40mg  in the evening 09/25/20 09/25/21 Yes Barton Dubois, MD  gabapentin (NEURONTIN) 600 MG tablet Take 1 tablet (600 mg total) by mouth 3 (three) times daily. 09/25/20  Yes Barton Dubois, MD  magnesium oxide (MAG-OX) 400 (241.3 Mg) MG tablet Take 1 tablet (400 mg total) by mouth daily. 09/25/20  Yes Barton Dubois, MD  metFORMIN (GLUCOPHAGE) 500 MG tablet Take 2 tablets (1,000 mg total) by mouth 2 (two) times daily with a meal. 09/25/20  Yes Barton Dubois, MD  mometasone-formoterol [facility]) 200-5 MCG/ACT AERO Inhale 2 puffs into the lungs in the morning and at bedtime. 09/25/20  Yes Barton Dubois, MD  Multiple Vitamin (MULTIVITAMIN WITH MINERALS) TABS  tablet Take 1 tablet by mouth daily. 12/09/19  Yes Gold, Wayne E, PA-C  OLANZapine (ZYPREXA) 5 MG tablet Take 5 mg by mouth at bedtime. 10/25/20  Yes [provider]  pantoprazole (PROTONIX) 40 MG tablet Take 1 tablet (40 mg total) by mouth daily. 09/26/20  Yes Barton Dubois, MD  potassium chloride SA (KLOR-CON) 20 MEQ tablet Take 2 tablets (40 mEq total) by mouth 2 (two) times daily. 09/25/20  Yes Barton Dubois, MD  rosuvastatin (CRESTOR) 40 MG tablet Take 1 tablet (40 mg total) by mouth daily. 09/25/20  Yes Barton Dubois, MD  spironolactone (ALDACTONE) 25 MG tablet Take 0.5 tablets (12.5 mg total) by mouth daily. 10/12/20  Yes Verta Ellen., NP  Tetrahydrozoline HCl (REDNESS RELIEVER EYE DROPS OP) Apply 2 drops to  eye daily as needed.   Yes [provider]  traZODone (DESYREL) 150 MG tablet Take 150 mg by mouth at bedtime. 09/27/20  Yes [provider]  blood glucose meter kit and supplies KIT Dispense based on patient and insurance preference. Use to check CBG's three times a day. (FOR ICD-9 250.00, 250.01). 09/25/20   Barton Dubois, MD     Interim history/subjective:  No further fluid administration overnight.  Patient will awaken follow commands.  Objective   Blood pressure (!) 85/68, pulse 84, temperature 98.96 F (37.2 C), resp. rate 18, height $RemoveBe'5\' 10"'xGsnlOIBy$  (1.778 m), weight 86.2 kg, SpO2 90 %. PAP: (20-52)/(8-20) 38/14 CVP:  [6 mmHg-15 mmHg] 11 mmHg CO:  [3.6 L/min-6.3 L/min] 5.1 L/min CI:  [1.9 L/min/m2-3.2 L/min/m2] 2.6 L/min/m2  Vent Mode: PSV FiO2 (%):  [40 %-50 %] 40 % Set Rate:  [12 bmp] 12 bmp Vt Set:  [580 mL] 580 mL PEEP:  [5 cmH20] 5 cmH20 Pressure Support:  [10 cmH20] 10 cmH20 Plateau Pressure:  [16 cmH20-18 cmH20] 16 cmH20   Intake/Output Summary (Last 24 hours) at 11/18/2020 1327 Last data filed at 11/18/2020 1326 Gross per 24 hour  Intake 9089.67 ml  Output 4108 ml  Net 4981.67 ml   Filed Weights   11/16/20 0627 11/17/20 0444 11/18/20 0343  Weight: 78.5 kg 77.9 kg 86.2 kg   PAP: (20-52)/(8-20) 38/14 CVP:  [6 mmHg-15 mmHg] 11 mmHg CO:  [3.6 L/min-6.3 L/min] 5.1 L/min CI:  [1.9 L/min/m2-3.2 L/min/m2] 2.6 L/min/m2   Physical Exam Constitutional:      Appearance: Normal appearance. He is overweight.     Interventions: He is sedated and intubated.  Neck:     Comments: Orally intubated. Cardiovascular:     Comments: HM 3 hum. Pericardial friction rub. Pulmonary:     Effort: He is intubated.     Breath sounds: Normal breath sounds.  Chest:     Comments: Midline sternotomy incision.  3 way chest tube approximately 500 mL output overnight Abdominal:     General: Abdomen is flat. Bowel sounds are decreased.  Genitourinary:    Comments: Foley  catheter in place, clear urine. Neurological:     Mental Status: He is awake and follows commands.      Ancillary tests (personally reviewed)  CBC: Recent Labs  Lab 11/16/20 0512 11/17/20 0232 11/17/20 0819 11/17/20 1149 11/17/20 1156 11/17/20 1503 11/17/20 1509 11/17/20 2013 11/17/20 2113 11/18/20 0327 11/18/20 0334  WBC 10.2 11.3*  --   --   --  21.6*  --  13.6*  --  13.0*  --   NEUTROABS  --   --   --   --   --   --   --   --   --  8.9*  --   HGB 11.4* 10.9*   < > 7.5*   < > 7.9* 8.2* 9.1* 8.2* 8.4* 7.8*  HCT 35.1* 33.4*   < > 22.9*   < > 24.3* 24.0* 26.1* 24.0* 25.6* 23.0*  MCV 86.5 86.3  --   --   --  87.4  --  85.9  --  87.7  --   PLT 155 175  --  124*  --  189  186  --  169  --  169  --    < > = values in this interval not displayed.    Basic Metabolic Panel: Recent Labs  Lab 11/16/20 0512 11/17/20 0232 11/17/20 0819 11/17/20 1244 11/17/20 1311 11/17/20 1314 11/17/20 1503 11/17/20 1509 11/17/20 2013 11/17/20 2113 11/18/20 0327 11/18/20 0334  NA 127* 129*   < > 135   < > 136 139 139 136 137 137 137  K 3.8 4.4   < > 4.0   < > 3.6 3.2* 3.3* 4.1 4.1 4.1 4.1  CL 88* 90*   < > 96*  --  98 100  --  100  --  101  --   CO2 27 28  --   --   --   --  28  --  26  --  26  --   GLUCOSE 395* 336*   < > 161*  --  147* 114*  --  104*  --  118*  --   BUN 18 15   < > 16  --  15 12  --  13  --  12  --   CREATININE 0.87  0.88 0.79   < > 0.60*  --  0.50* 0.69  --  0.69  --  0.73  --   CALCIUM 8.8* 8.9  --   --   --   --  8.4*  --  9.0  --  8.4*  --   MG  --   --   --   --   --   --  2.1  --  2.6*  --  2.3  --   PHOS  --   --   --   --   --   --   --   --   --   --  4.6  --    < > = values in this interval not displayed.   GFR: Estimated Creatinine Clearance: 102.7 mL/min (by C-G formula based on SCr of 0.73 mg/dL). Recent Labs  Lab 11/17/20 0232 11/17/20 1503 11/17/20 2013 11/18/20 0327  WBC 11.3* 21.6* 13.6* 13.0*    Liver Function Tests: Recent Labs  Lab  11/16/20 1426 11/18/20 0327  AST 22 59*  ALT 20 21  ALKPHOS 81 44  BILITOT 1.0 1.2  PROT 6.4* 5.2*  ALBUMIN 3.0* 3.2*   No results for input(s): LIPASE, AMYLASE in the last 168 hours. No results for input(s): AMMONIA in the last 168 hours.  ABG    Component Value Date/Time   PHART 7.435 11/18/2020 0334   PCO2ART 43.4 11/18/2020 0334   PO2ART 107 11/18/2020 0334   HCO3 29.1 (H) 11/18/2020 0334   TCO2 30 11/18/2020 0334   ACIDBASEDEF 6.0 (H) 11/24/2019 1718   O2SAT 98.0 11/18/2020 0334     Coagulation Profile: Recent Labs  Lab 11/17/20 1503 11/18/20 0327  INR 1.3*  1.3* 1.2    Cardiac Enzymes: No results for input(s): CKTOTAL, CKMB, CKMBINDEX, TROPONINI in the last 168  hours.  HbA1C: Hgb A1c MFr Bld  Date/Time Value Ref Range Status  11/10/2020 10:48 PM 10.4 (H) 4.8 - 5.6 % Final    Comment:    (NOTE) Pre diabetes:          5.7%-6.4%  Diabetes:              >6.4%  Glycemic control for   <7.0% adults with diabetes   09/22/2020 09:44 AM 9.4 (H) 4.8 - 5.6 % Final    Comment:    (NOTE)         Prediabetes: 5.7 - 6.4         Diabetes: >6.4         Glycemic control for adults with diabetes: <7.0     CBG: Recent Labs  Lab 11/18/20 0530 11/18/20 0621 11/18/20 0746 11/18/20 0849 11/18/20 1050  GLUCAP 119* 123* 122* 123* 139*    Assessment & Plan:  Critically ill due to expect acute hypoxic respiratory failure following LVAD implantation Critically ill due to mixed distributive and cardiogenic shock following cardiopulmonary bypass on background of known ischemic cardiomyopathy Acute decompensated biventricular failure with EF less than 20% Coronary artery disease History of paroxysmal atrial fibrillation Type 2 diabetes COPD with PFTs indicating severe airway obstruction  Plan:  -Wean nitric oxide to off -If pulmonary artery pressures remain acceptable perform SBT and extubate after 2 hours -Diurese today -Continue home -Continue current LVAD  flow -Wean norepinephrine to off -Wean epinephrine to off once extubated -Continue milrinone for RV support for the next 48 hours at least   Daily Goals Checklist  Pain/Anxiety/Delirium protocol (if indicated): Dexmedetomidine, fentanyl for pain, infusions off, multimodal pain control. VAP protocol (if indicated): Bundle in place Respiratory support goals: Extubate today  blood pressure target: Keep MAP 70-90 DVT prophylaxis: Holding chemical prophylaxis due to bleeding will need full anticoagulation starting tomorrow Nutritional status and feeding goals: High risk.  Initiate feeds tomorrow if remains intubated GI prophylaxis: Pantoprazole Fluid status goals: Diurese today Urinary catheter: Assessment of intravascular volume Central lines: Right IJ introducer with Swan, right radial art Glucose control: Transition to subcutaneous insulin Mobility/therapy needs: Progressive ambulation Antibiotic de-escalation: Perioperative antibiotics Home medication reconciliation: On hold Daily labs: Per CT surgery protocol Code Status: Full code Family Communication: Wife has been updated at the bedside Disposition: ICU  CRITICAL CARE Performed by: Kipp Brood   Total critical care time: 40 minutes  Critical care time was exclusive of separately billable procedures and treating other patients.  Critical care was necessary to treat or prevent imminent or life-threatening deterioration.  Critical care was time spent personally by me on the following activities: development of treatment plan with patient and/or surrogate as well as nursing, discussions with consultants, evaluation of patient's response to treatment, examination of patient, obtaining history from patient or surrogate, ordering and performing treatments and interventions, ordering and review of laboratory studies, ordering and review of radiographic studies, pulse oximetry, re-evaluation of patient's condition and participation in  multidisciplinary rounds.  Kipp Brood, MD [facility] ICU Physician [place]  Pager: [phone] Mobile: [phone] After hours: [phone].    11/18/2020, 1:27 PM

## 2020-11-19 ENCOUNTER — Inpatient Hospital Stay (HOSPITAL_COMMUNITY): Payer: 59

## 2020-11-19 LAB — COMPREHENSIVE METABOLIC PANEL
ALT: 22 U/L (ref 0–44)
AST: 47 U/L — ABNORMAL HIGH (ref 15–41)
Albumin: 2.6 g/dL — ABNORMAL LOW (ref 3.5–5.0)
Alkaline Phosphatase: 51 U/L (ref 38–126)
Anion gap: 8 (ref 5–15)
BUN: 13 mg/dL (ref 6–20)
CO2: 26 mmol/L (ref 22–32)
Calcium: 8.3 mg/dL — ABNORMAL LOW (ref 8.9–10.3)
Chloride: 97 mmol/L — ABNORMAL LOW (ref 98–111)
Creatinine, Ser: 0.69 mg/dL (ref 0.61–1.24)
GFR, Estimated: >60 mL/min (ref >60)
Glucose, Bld: 77 mg/dL (ref 70–99)
Potassium: 3.7 mmol/L (ref 3.5–5.1)
Sodium: 131 mmol/L — ABNORMAL LOW (ref 135–145)
Total Bilirubin: 1.1 mg/dL (ref 0.3–1.2)
Total Protein: 5 g/dL — ABNORMAL LOW (ref 6.5–8.1)

## 2020-11-19 LAB — GLUCOSE, CAPILLARY
Glucose-Capillary: 100 mg/dL — ABNORMAL HIGH (ref 70–99)
Glucose-Capillary: 122 mg/dL — ABNORMAL HIGH (ref 70–99)
Glucose-Capillary: 142 mg/dL — ABNORMAL HIGH (ref 70–99)
Glucose-Capillary: 143 mg/dL — ABNORMAL HIGH (ref 70–99)
Glucose-Capillary: 145 mg/dL — ABNORMAL HIGH (ref 70–99)
Glucose-Capillary: 178 mg/dL — ABNORMAL HIGH (ref 70–99)
Glucose-Capillary: 73 mg/dL (ref 70–99)
Glucose-Capillary: 81 mg/dL (ref 70–99)
Glucose-Capillary: 97 mg/dL (ref 70–99)

## 2020-11-19 LAB — CBC WITH DIFFERENTIAL/PLATELET
Abs Immature Granulocytes: 0.07 10*3/uL (ref 0.00–0.07)
Basophils Absolute: 0 10*3/uL (ref 0.0–0.1)
Basophils Relative: 0 %
Eosinophils Absolute: 0.2 10*3/uL (ref 0.0–0.5)
Eosinophils Relative: 2 %
HCT: 24.5 % — ABNORMAL LOW (ref 39.0–52.0)
Hemoglobin: 8.2 g/dL — ABNORMAL LOW (ref 13.0–17.0)
Immature Granulocytes: 1 %
Lymphocytes Relative: 13 %
Lymphs Abs: 1.5 10*3/uL (ref 0.7–4.0)
MCH: 29.4 pg (ref 26.0–34.0)
MCHC: 33.5 g/dL (ref 30.0–36.0)
MCV: 87.8 fL (ref 80.0–100.0)
Monocytes Absolute: 2.3 10*3/uL — ABNORMAL HIGH (ref 0.1–1.0)
Monocytes Relative: 20 %
Neutro Abs: 7.3 10*3/uL (ref 1.7–7.7)
Neutrophils Relative %: 64 %
Platelets: 144 10*3/uL — ABNORMAL LOW (ref 150–400)
RBC: 2.79 MIL/uL — ABNORMAL LOW (ref 4.22–5.81)
RDW: 15.5 % (ref 11.5–15.5)
WBC: 11.4 10*3/uL — ABNORMAL HIGH (ref 4.0–10.5)
nRBC: 0.3 % — ABNORMAL HIGH (ref 0.0–0.2)

## 2020-11-19 LAB — POCT I-STAT 7, (LYTES, BLD GAS, ICA,H+H)
Acid-Base Excess: 4 mmol/L — ABNORMAL HIGH (ref 0.0–2.0)
Bicarbonate: 27.5 mmol/L (ref 20.0–28.0)
Calcium, Ion: 1.17 mmol/L (ref 1.15–1.40)
HCT: 23 % — ABNORMAL LOW (ref 39.0–52.0)
Hemoglobin: 7.8 g/dL — ABNORMAL LOW (ref 13.0–17.0)
O2 Saturation: 96 %
Patient temperature: 37.3
Potassium: 3.7 mmol/L (ref 3.5–5.1)
Sodium: 132 mmol/L — ABNORMAL LOW (ref 135–145)
TCO2: 29 mmol/L (ref 22–32)
pCO2 arterial: 38.2 mmHg (ref 32.0–48.0)
pH, Arterial: 7.466 — ABNORMAL HIGH (ref 7.350–7.450)
pO2, Arterial: 79 mmHg — ABNORMAL LOW (ref 83.0–108.0)

## 2020-11-19 LAB — MAGNESIUM: Magnesium: 2.2 mg/dL (ref 1.7–2.4)

## 2020-11-19 LAB — COOXEMETRY PANEL
Carboxyhemoglobin: 1.6 % — ABNORMAL HIGH (ref 0.5–1.5)
Carboxyhemoglobin: 1.9 % — ABNORMAL HIGH (ref 0.5–1.5)
Methemoglobin: 0.7 % (ref 0.0–1.5)
Methemoglobin: 0.9 % (ref 0.0–1.5)
O2 Saturation: 43.9 %
O2 Saturation: 54.8 %
Total hemoglobin: 8.4 g/dL — ABNORMAL LOW (ref 12.0–16.0)
Total hemoglobin: 8.4 g/dL — ABNORMAL LOW (ref 12.0–16.0)

## 2020-11-19 LAB — PROTIME-INR
INR: 1.3 — ABNORMAL HIGH (ref 0.8–1.2)
Prothrombin Time: 15.9 seconds — ABNORMAL HIGH (ref 11.4–15.2)

## 2020-11-19 LAB — PREPARE RBC (CROSSMATCH)

## 2020-11-19 LAB — LACTATE DEHYDROGENASE: LDH: 340 U/L — ABNORMAL HIGH (ref 98–192)

## 2020-11-19 LAB — PHOSPHORUS: Phosphorus: 3 mg/dL (ref 2.5–4.6)

## 2020-11-19 MED ORDER — ACETAMINOPHEN 325 MG PO TABS
650.0000 mg | ORAL_TABLET | Freq: Four times a day (QID) | ORAL | Status: AC
  Administered 2020-11-19 – 2020-11-24 (×18): 650 mg via ORAL
  Filled 2020-11-19 (×18): qty 2

## 2020-11-19 MED ORDER — GABAPENTIN 300 MG PO CAPS: 300.0000 mg | ORAL_CAPSULE | Freq: Three times a day (TID) | ORAL | Status: DC

## 2020-11-19 MED ORDER — GABAPENTIN 300 MG PO CAPS: 600.0000 mg | ORAL_CAPSULE | Freq: Three times a day (TID) | ORAL | Status: DC

## 2020-11-19 MED ORDER — SODIUM CHLORIDE 0.9% IV SOLUTION: Freq: Once | INTRAVENOUS | Status: AC

## 2020-11-19 MED ORDER — LIDOCAINE 5 % EX PTCH
1.0000 | MEDICATED_PATCH | CUTANEOUS | Status: DC
  Administered 2020-11-19 – 2020-11-20 (×2): 1 via TRANSDERMAL
  Filled 2020-11-19 (×2): qty 1

## 2020-11-19 MED ORDER — POTASSIUM CHLORIDE CRYS ER 20 MEQ PO TBCR
40.0000 meq | EXTENDED_RELEASE_TABLET | Freq: Once | ORAL | Status: AC
  Administered 2020-11-19: 16:00:00 40 meq via ORAL
  Filled 2020-11-19: qty 2

## 2020-11-19 MED ORDER — POTASSIUM CHLORIDE CRYS ER 20 MEQ PO TBCR
20.0000 meq | EXTENDED_RELEASE_TABLET | ORAL | Status: DC
Start: 2020-11-19 — End: 2020-11-19
  Filled 2020-11-19: qty 1

## 2020-11-19 MED ORDER — WARFARIN - PHARMACIST DOSING INPATIENT: Freq: Every day | Status: DC

## 2020-11-19 MED ORDER — FUROSEMIDE 10 MG/ML IJ SOLN
12.0000 mg/h | INTRAVENOUS | Status: DC
  Administered 2020-11-19 – 2020-11-20 (×2): 8 mg/h via INTRAVENOUS
  Administered 2020-11-21 (×2): 12 mg/h via INTRAVENOUS
  Filled 2020-11-19 (×6): qty 20

## 2020-11-19 MED ORDER — WARFARIN SODIUM 2.5 MG PO TABS
2.5000 mg | ORAL_TABLET | Freq: Once | ORAL | Status: AC
  Administered 2020-11-19: 16:00:00 2.5 mg via ORAL
  Filled 2020-11-19: qty 1

## 2020-11-19 MED ORDER — QUETIAPINE FUMARATE 50 MG PO TABS
25.0000 mg | ORAL_TABLET | Freq: Every day | ORAL | Status: DC
  Administered 2020-11-19 – 2020-11-27 (×9): 25 mg via ORAL
  Filled 2020-11-19 (×9): qty 1

## 2020-11-19 MED ORDER — POTASSIUM CHLORIDE CRYS ER 20 MEQ PO TBCR
40.0000 meq | EXTENDED_RELEASE_TABLET | Freq: Once | ORAL | Status: AC
  Administered 2020-11-19: 10:00:00 40 meq via ORAL
  Filled 2020-11-19: qty 2

## 2020-11-19 MED ORDER — FUROSEMIDE 10 MG/ML IJ SOLN
40.0000 mg | Freq: Once | INTRAMUSCULAR | Status: AC
  Administered 2020-11-19: 09:00:00 40 mg via INTRAVENOUS
  Filled 2020-11-19: qty 4

## 2020-11-19 NOTE — Progress Notes (Signed)
ANTICOAGULATION CONSULT NOTE   Pharmacy Consult for Warfarin Indication: atrial fibrillation, new Heart Mate III LVAD implant  No Known Allergies  Patient Measurements: Height: 5\' 10"  (177.8 cm) Weight: 88.7 kg (195 lb 8.8 oz) IBW/kg (Calculated) : 73 Heparin Dosing Weight: 82kg  Vital Signs: Temp: 99.14 F (37.3 C) (12/12 0630) Pulse Rate: 75 (12/12 0836)  Labs: Recent Labs    11/17/20 1503 11/17/20 1509 11/18/20 0327 11/18/20 0334 11/18/20 1619 11/18/20 1627 11/19/20 0436 11/19/20 0611  HGB 7.9*   < > 8.4*   < > 8.2* 7.5* 8.2* 7.8*  HCT 24.3*   < > 25.6*   < > 24.4* 22.0* 24.5* 23.0*  PLT 189  186   < > 169  --  161  --  144*  --   APTT 32  32  --   --   --   --   --   --   --   LABPROT 15.3*  15.3*  --  15.2  --   --   --  15.9*  --   INR 1.3*  1.3*  --  1.2  --   --   --  1.3*  --   CREATININE 0.69   < > 0.73  --  0.72  --  0.69  --    < > = values in this interval not displayed.    Estimated Creatinine Clearance: 111.5 mL/min (by C-G formula based on SCr of 0.69 mg/dL).   Medical History: Past Medical History:  Diagnosis Date  . Arthritis   . CAD (coronary artery disease)    a. s/p CABG in 11/2019 with LIMA-LAD, SVG-OM1, SVG-PDA and SVG-D1  . CHF (congestive heart failure) ([facility])    a. EF < 20% by echo in 11/2019  . COPD (chronic obstructive pulmonary disease) ([facility])   . Essential hypertension   . PAF (paroxysmal atrial fibrillation) ([facility])   . Type 2 diabetes mellitus ([facility])      Assessment: 89 yoM admitted with CP and cardiogenic shock. Pt has hx AFib with no AC PTA, pharmacy to start IV heparin.   Impella 5.5 placed 12/3. LVAD placed 12/10.   New orders to start warfarin post LVAD implant. Hgb low but has been stable postop at 7.8 today, plt 144, LDH 340. INR this am is 1.3.   Goal of Therapy:  INR goal 2-2.5 Monitor platelets by anticoagulation protocol: Yes   Plan:  Warfarin 2.5mg  tonight Daily INR Will provide education prior to  discharge  14/10 PharmD., BCPS Clinical Pharmacist 11/19/2020 8:39 AM

## 2020-11-19 NOTE — Progress Notes (Signed)
Physical Therapy Re-Eval/Treatment Patient Details Name: Kevin Underwood MRN: [id number] DOB: [birth year] Today's Date: 11/19/2020    History of Present Illness Kevin Underwood is a 59 y.o. 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 Pt adm 12/01 with Acute on chronic systolic CHF with cardiogenic shock. Had impella placed 12/3. Underwent LVAD on 11/17/20.    PT Comments    Pt now s/p LVAD placement and with expected decr in mobility post-op. Can benefit from PT to incr independence and safety in order to return home. Will also need extensive education on his LVAD. Initiated education on the importance of keeping controller secured and not to let it drop and pull on drive line. Pt able to tolerate standing x 2 with the rollator. Able to stand for 3-4 minutes.  Expect pt will make good progress and be able to return home with wife.   Follow Up Recommendations  Home health PT;Supervision/Assistance - 24 hour (May progress and not need HHPT)     Equipment Recommendations  Other (comment) (Possible rollator)    Recommendations for Other Services       Precautions / Restrictions Precautions Precautions: None Restrictions Weight Bearing Restrictions: Yes    Mobility  Bed Mobility               General bed mobility comments: Pt up in recliner  Transfers Overall transfer level: Needs assistance Equipment used: 4-wheeled walker Transfers: Sit to/from Stand Sit to Stand: Min assist;+2 physical assistance         General transfer comment: Assist to bring hips up and for stability. Also assist for management of lines  Ambulation/Gait Ambulation/Gait assistance: Min assist;+2 safety/equipment   Assistive device: 4-wheeled walker       General Gait Details: Pt performed stepping in place   Stairs              Wheelchair Mobility    Modified Rankin (Stroke Patients Only)       Balance Overall balance assessment: Needs assistance Sitting-balance support: No upper extremity supported;Feet supported Sitting balance-Leahy Scale: Fair     Standing balance support: Bilateral upper extremity supported;During functional activity Standing balance-Leahy Scale: Poor Standing balance comment: UE support of rollator                            Cognition Arousal/Alertness: Awake/alert Behavior During Therapy: WFL for tasks assessed/performed Overall Cognitive Status: Within Functional Limits for tasks assessed                                        Exercises      General Comments General comments (skin integrity, edema, etc.): VSS. Pt on 2L O2      Pertinent Vitals/Pain Pain Assessment: Faces Faces Pain Scale: Hurts whole lot Pain Location: abdomen Pain Descriptors / Indicators: Grimacing (bloating)    Home Living                      Prior Function            PT Goals (current goals can now be found in the care plan section) Acute Rehab PT Goals Patient Stated Goal: feel better PT Goal Formulation:  With patient Progress towards PT goals: Progressing toward goals    Frequency    Min 3X/week      PT Plan Discharge plan needs to be updated    Co-evaluation PT/OT/SLP Co-Evaluation/Treatment: Yes Reason for Co-Treatment: Complexity of the patient's impairments (multi-system involvement);For patient/therapist safety PT goals addressed during session: Mobility/safety with mobility        AM-PAC PT "6 Clicks" Mobility   Outcome Measure  Help needed turning from your back to your side while in a flat bed without using bedrails?: A Little Help needed moving from lying on your back to sitting on the side of a flat bed without using bedrails?: A Little Help needed moving to and from a bed to a chair (including a wheelchair)?: A  Little Help needed standing up from a chair using your arms (e.g., wheelchair or bedside chair)?: A Little Help needed to walk in hospital room?: A Little Help needed climbing 3-5 steps with a railing? : A Lot 6 Click Score: 17    End of Session   Activity Tolerance: Patient tolerated treatment well Patient left: with call bell/phone within reach;in chair Nurse Communication: Mobility status PT Visit Diagnosis: Muscle weakness (generalized) (M62.81);Other abnormalities of gait and mobility (R26.89)     Time: 7322-0254 PT Time Calculation (min) (ACUTE ONLY): 31 min  Charges:                        [facility] PT Acute Rehabilitation Services Pager [phone] Office [phone]    Angelina Ok Sarah Bush [facility] 11/19/2020, 9:59 AM

## 2020-11-19 NOTE — Progress Notes (Signed)
NAME:  Kevin Underwood, MRN:  [id number], DOB:  23-Mar-1961, LOS: 11 ADMISSION DATE:  11/08/2020, CONSULTATION DATE:  11/17/2020 REFERRING MD: Vickey Sages, CHIEF COMPLAINT: Status post VAD implantation  HPI/course in hospital  59 year old man with history of ischemic cardiomyopathy presented 12/1 with shortness of breath and chest tightness.  Increasing weight, increasing abdominal girth and poor appetite.  Known ischemic cardiomyopathy with prior three-vessel bypass 1 year ago requiring perioperative Impella support.  Right heart catheterization showing a low SCV O2 and cardiac index.  Impella 5.5 inserted for preoperative optimization, diuresed.  Hemodynamics improved  Underwent implantation HeartMate 3 LVAD.  Received 4 L of fluid intraoperatively.  Came off bypass uneventfully  Past Medical History   Past Medical History:  Diagnosis Date  . Arthritis   . CAD (coronary artery disease)    a. s/p CABG in 11/2019 with LIMA-LAD, SVG-OM1, SVG-PDA and SVG-D1  . CHF (congestive heart failure) (HCC)    a. EF < 20% by echo in 11/2019  . COPD (chronic obstructive pulmonary disease) (HCC)   . Essential hypertension   . PAF (paroxysmal atrial fibrillation) (HCC)   . Type 2 diabetes mellitus (HCC)      Past Surgical History:  Procedure Laterality Date  . BACK SURGERY    . CLIPPING OF ATRIAL APPENDAGE N/A 11/26/2019   Procedure: Clipping Of Atrial Appendage using AtriCure 40 Clip;  Surgeon: [name], MD;  Location: [facility] OR;  Service: Open Heart Surgery;  Laterality: N/A;  . CORONARY ARTERY BYPASS GRAFT N/A 11/26/2019   Procedure: CORONARY ARTERY BYPASS GRAFTING (CABG) using endoscopic greater saphenous vein harvest: svc to OM; svc to Diag; svc to PD; and LIMA to LAD.;  Surgeon: [name], MD;  Location: [facility] OR;  Service: Open Heart Surgery;  Laterality: N/A;  . FOOT SURGERY    . HAND SURGERY    . INTRAOPERATIVE TRANSESOPHAGEAL ECHOCARDIOGRAM  12/04/2019   Procedure: Intraoperative  Transesophageal Echocardiogram;  Surgeon: [name], MD;  Location: [facility] OR;  Service: Open Heart Surgery;;  . MAZE N/A 11/26/2019   Procedure: MAZE using Bilateral Pulmonary Vein isolation.;  Surgeon: [name], MD;  Location: [facility] OR;  Service: Open Heart Surgery;  Laterality: N/A;  . MULTIPLE EXTRACTIONS WITH ALVEOLOPLASTY N/A 11/16/2020   Procedure: MULTIPLE EXTRACTION WITH ALVEOLOPLASTY;  Surgeon: Sharman Cheek, DMD;  Location: [facility] OR;  Service: Dentistry;  Laterality: N/A;  . PLACEMENT OF IMPELLA LEFT VENTRICULAR ASSIST DEVICE N/A 11/24/2019   Procedure: PLACEMENT OF IMPELLA 5.5 LEFT VENTRICULAR ASSIST DEVICE;  Surgeon: [name], MD;  Location: [facility] OR;  Service: Open Heart Surgery;  Laterality: N/A;  . PLACEMENT OF IMPELLA LEFT VENTRICULAR ASSIST DEVICE Left 11/10/2020   Procedure: PLACEMENT OF IMPELLA 5.5 LEFT VENTRICULAR ASSIST DEVICE VIA  LEFT AVILLARY ARTERY;  Surgeon: [name], MD;  Location: [facility] OR;  Service: Open Heart Surgery;  Laterality: Left;  . REMOVAL OF IMPELLA LEFT VENTRICULAR ASSIST DEVICE Right 12/04/2019   Procedure: REMOVAL OF IMPELLA LEFT VENTRICULAR ASSIST DEVICE, right axilla;  Surgeon: [name], MD;  Location: [facility] OR;  Service: Open Heart Surgery;  Laterality: Right;  . RIGHT/LEFT HEART CATH AND CORONARY ANGIOGRAPHY N/A 11/24/2019   Procedure: RIGHT/LEFT HEART CATH AND CORONARY ANGIOGRAPHY;  Surgeon: Laurey Morale, MD;  Location: [facility] INVASIVE CV LAB;  Service: Cardiovascular;  Laterality: N/A;  . RIGHT/LEFT HEART CATH AND CORONARY/GRAFT ANGIOGRAPHY N/A 11/09/2020   Procedure: RIGHT/LEFT HEART CATH AND CORONARY/GRAFT ANGIOGRAPHY;  Surgeon: [name], MD;  Location: [facility] INVASIVE CV LAB;  Service: Cardiovascular;  Laterality: N/A;  . TEE WITHOUT CARDIOVERSION N/A 11/24/2019   Procedure: TRANSESOPHAGEAL ECHOCARDIOGRAM (TEE);  Surgeon: [name], MD;  Location: [facility] OR;  Service: Open Heart Surgery;  Laterality: N/A;  . TEE  WITHOUT CARDIOVERSION N/A 11/26/2019   Procedure: TRANSESOPHAGEAL ECHOCARDIOGRAM (TEE);  Surgeon: [name], MD;  Location: [facility] OR;  Service: Open Heart Surgery;  Laterality: N/A;  . TEE WITHOUT CARDIOVERSION N/A 11/10/2020   Procedure: TRANSESOPHAGEAL ECHOCARDIOGRAM (TEE);  Surgeon: [name], MD;  Location: [facility] OR;  Service: Open Heart Surgery;  Laterality: N/A;        Interim history/subjective:  Extubated yesterday and initially confused. Mentation has improved today. Limited exercise tolerance complains of fatigue and chest pain on deep inspiration particularly after mobilizing to chair.  Objective   Blood pressure 98/64, pulse 75, temperature 97.88 F (36.6 C), resp. rate 15, height 5\' 10"  (1.778 m), weight 88.7 kg, SpO2 100 %. PAP: (19-52)/(-3-26) 28/10 CVP:  [1 mmHg-28 mmHg] 10 mmHg CO:  [5 L/min-5.2 L/min] 5.2 L/min CI:  [2.6 L/min/m2] 2.6 L/min/m2  Vent Mode: Stand-by   Intake/Output Summary (Last 24 hours) at 11/19/2020 1233 Last data filed at 11/19/2020 1200 Gross per 24 hour  Intake 3438.11 ml  Output 2705 ml  Net 733.11 ml   Filed Weights   11/17/20 0444 11/18/20 0343 11/19/20 0500  Weight: 77.9 kg 86.2 kg 88.7 kg   PAP: (19-52)/(-3-26) 28/10 CVP:  [1 mmHg-28 mmHg] 10 mmHg CO:  [5 L/min-5.2 L/min] 5.2 L/min CI:  [2.6 L/min/m2] 2.6 L/min/m2   Physical Exam Constitutional:      Appearance: Normal appearance. He is overweight.     Interventions: He is sitting in chair Neck:     Comments: Trachea is midline. Cardiovascular:     Comments: HM 3 hum. No pericardial friction rub. Pulmonary:     Effort: Rapid shallow breaths    Breath sounds: Crackles at both bases Chest:     Comments: Midline sternotomy incision. Abdominal:     General: Abdomen is flat. Bowel sounds are decreased.  Genitourinary:    Comments: Foley catheter in place, clear urine. Neurological:     Mental Status: He is awake and follows commands. No focal  deficits   Ancillary tests (personally reviewed)  CBC: Recent Labs  Lab 11/17/20 1503 11/17/20 1509 11/17/20 2013 11/17/20 2113 11/18/20 0327 11/18/20 0334 11/18/20 1619 11/18/20 1627 11/19/20 0436 11/19/20 0611  WBC 21.6*  --  13.6*  --  13.0*  --  12.8*  --  11.4*  --   NEUTROABS  --   --   --   --  8.9*  --   --   --  7.3  --   HGB 7.9*   < > 9.1*   < > 8.4* 7.8* 8.2* 7.5* 8.2* 7.8*  HCT 24.3*   < > 26.1*   < > 25.6* 23.0* 24.4* 22.0* 24.5* 23.0*  MCV 87.4  --  85.9  --  87.7  --  87.5  --  87.8  --   PLT 189  186  --  169  --  169  --  161  --  144*  --    < > = values in this interval not displayed.    Basic Metabolic Panel: Recent Labs  Lab 11/17/20 1503 11/17/20 1509 11/17/20 2013 11/17/20 2113 11/18/20 0327 11/18/20 0334 11/18/20 1619 11/18/20 1627 11/19/20 0436 11/19/20 0611  NA 139   < >  136   < > 137 137 134* 135 131* 132*  K 3.2*   < > 4.1   < > 4.1 4.1 3.8 3.8 3.7 3.7  CL 100  --  100  --  101  --  99  --  97*  --   CO2 28  --  26  --  26  --  27  --  26  --   GLUCOSE 114*  --  104*  --  118*  --  98  --  77  --   BUN 12  --  13  --  12  --  12  --  13  --   CREATININE 0.69  --  0.69  --  0.73  --  0.72  --  0.69  --   CALCIUM 8.4*  --  9.0  --  8.4*  --  8.6*  --  8.3*  --   MG 2.1  --  2.6*  --  2.3  --  2.3  --  2.2  --   PHOS  --   --   --   --  4.6  --   --   --  3.0  --    < > = values in this interval not displayed.   GFR: Estimated Creatinine Clearance: 111.5 mL/min (by C-G formula based on SCr of 0.69 mg/dL). Recent Labs  Lab 11/17/20 2013 11/18/20 0327 11/18/20 1619 11/19/20 0436  WBC 13.6* 13.0* 12.8* 11.4*    Liver Function Tests: Recent Labs  Lab 11/16/20 1426 11/18/20 0327 11/19/20 0436  AST 22 59* 47*  ALT 20 21 22   ALKPHOS 81 44 51  BILITOT 1.0 1.2 1.1  PROT 6.4* 5.2* 5.0*  ALBUMIN 3.0* 3.2* 2.6*   No results for input(s): LIPASE, AMYLASE in the last 168 hours. No results for input(s): AMMONIA in the last 168  hours.  ABG    Component Value Date/Time   PHART 7.466 (H) 11/19/2020 0611   PCO2ART 38.2 11/19/2020 0611   PO2ART 79 (L) 11/19/2020 0611   HCO3 27.5 11/19/2020 0611   TCO2 29 11/19/2020 0611   ACIDBASEDEF 6.0 (H) 11/24/2019 1718   O2SAT 54.8 11/19/2020 0840     Coagulation Profile: Recent Labs  Lab 11/17/20 1503 11/18/20 0327 11/19/20 0436  INR 1.3*  1.3* 1.2 1.3*    Cardiac Enzymes: No results for input(s): CKTOTAL, CKMB, CKMBINDEX, TROPONINI in the last 168 hours.  HbA1C: Hgb A1c MFr Bld  Date/Time Value Ref Range Status  11/10/2020 10:48 PM 10.4 (H) 4.8 - 5.6 % Final    Comment:    (NOTE) Pre diabetes:          5.7%-6.4%  Diabetes:              >6.4%  Glycemic control for   <7.0% adults with diabetes   09/22/2020 09:44 AM 9.4 (H) 4.8 - 5.6 % Final    Comment:    (NOTE)         Prediabetes: 5.7 - 6.4         Diabetes: >6.4         Glycemic control for adults with diabetes: <7.0     CBG: Recent Labs  Lab 11/18/20 1628 11/18/20 1914 11/18/20 2356 11/19/20 0435 11/19/20 0730  GLUCAP 82 97 145* 81 73    Assessment & Plan:  Acute hypoxic respiratory insufficiency following valve implantation Critically ill due to mixed distributive and cardiogenic shock following cardiopulmonary bypass on  background of known ischemic cardiomyopathy Acute decompensated biventricular failure with EF less than 20% Coronary artery disease History of paroxysmal atrial fibrillation Type 2 diabetes COPD with PFTs indicating severe airway obstruction  Plan:  -Optimize pain control -Incentive spirometry -Diurese today per heart failure -On home COPD regimen - Wean off Precedex, Seroquel at bedtime -Continue current LVAD flow -Wean norepinephrine to off -Wean epinephrine to off once extubated -Continue milrinone for RV support for the next 48 hours at least   Daily Goals Checklist  Pain/Anxiety/Delirium protocol (if indicated): Multimodal pain control. Seroquel  qhs VAP protocol (if indicated): Bundle in place Respiratory support goals: Incentive spirometer Blood pressure target: Keep MAP 70-90 DVT prophylaxis: Beginning Coumadin Nutritional status and feeding goals: Progressive diet GI prophylaxis: Pantoprazole Fluid status goals: Diurese today starting on furosemide infusion Urinary catheter: Assessment of intravascular volume Central lines: Right IJ introducer with Swan, right radial art Glucose control: Optimal control on subcutaneous insulin Mobility/therapy needs: Progressive ambulation Antibiotic de-escalation: Perioperative antibiotics Home medication reconciliation: Resume home meds Daily labs: Per CT surgery protocol Code Status: Full code Family Communication: Wife has been updated at the bedside Disposition: ICU  CRITICAL CARE Performed by: Lynnell Catalan   Total critical care time: 40 minutes  Critical care time was exclusive of separately billable procedures and treating other patients.  Critical care was necessary to treat or prevent imminent or life-threatening deterioration.  Critical care was time spent personally by me on the following activities: development of treatment plan with patient and/or surrogate as well as nursing, discussions with consultants, evaluation of patient's response to treatment, examination of patient, obtaining history from patient or surrogate, ordering and performing treatments and interventions, ordering and review of laboratory studies, ordering and review of radiographic studies, pulse oximetry, re-evaluation of patient's condition and participation in multidisciplinary rounds.  Lynnell Catalan, MD [facility] ICU Physician [facility] [place] [facility]  Pager: [phone] Mobile: [phone] After hours: [phone].    11/19/2020, 12:33 PM

## 2020-11-19 NOTE — Progress Notes (Signed)
2 Days Post-Op Procedure(s) (LRB): REDO STERNOTOMY (N/A) INSERTION OF IMPLANTABLE LEFT VENTRICULAR ASSIST DEVICE - HM3 (N/A) TRANSESOPHAGEAL ECHOCARDIOGRAM (TEE) (N/A) Subjective: hungry  Objective: Vital signs in last 24 hours: Temp:  [98.42 F (36.9 C)-100.4 F (38 C)] 99.14 F (37.3 C) (12/12 0630) Pulse Rate:  [84-86] 86 (12/12 0000) Cardiac Rhythm: Normal sinus rhythm (12/12 0400) Resp:  [0-25] 16 (12/12 0630) BP: (79-99)/(64-80) 98/64 (12/11 1800) SpO2:  [89 %-100 %] 95 % (12/12 0630) Arterial Line BP: (62-127)/(47-82) 73/59 (12/12 0630) FiO2 (%):  [40 %] 40 % (12/11 1211) Weight:  [88.7 kg] 88.7 kg (12/12 0500)  Hemodynamic parameters for last 24 hours: PAP: (19-52)/(-3-26) 30/10 CVP:  [1 mmHg-28 mmHg] 9 mmHg CO:  [4.5 L/min-6.3 L/min] 5 L/min CI:  [2.3 L/min/m2-3.2 L/min/m2] 2.6 L/min/m2  Intake/Output from previous day: 12/11 0701 - 12/12 0700 In: 4402.8 [P.O.:240; I.V.:2714.8; NG/GT:325; IV Piggyback:948] Out: 2810 [Urine:2075; Drains:25; Chest Tube:710] Intake/Output this shift: No intake/output data recorded.  General appearance: alert and cooperative Neurologic: intact Heart: regular rate and rhythm, S1, S2 normal, no murmur, click, rub or gallop Lungs: clear to auscultation bilaterally Abdomen: soft, non-tender; bowel sounds normal; no masses,  no organomegaly Extremities: extremities normal, atraumatic, no cyanosis or edema Wound: dressed, dry  Lab Results: Recent Labs    11/18/20 1619 11/18/20 1627 11/19/20 0436 11/19/20 0611  WBC 12.8*  --  11.4*  --   HGB 8.2*   < > 8.2* 7.8*  HCT 24.4*   < > 24.5* 23.0*  PLT 161  --  144*  --    < > = values in this interval not displayed.   BMET:  Recent Labs    11/18/20 1619 11/18/20 1627 11/19/20 0436 11/19/20 0611  NA 134*   < > 131* 132*  K 3.8   < > 3.7 3.7  CL 99  --  97*  --   CO2 27  --  26  --   GLUCOSE 98  --  77  --   BUN 12  --  13  --   CREATININE 0.72  --  0.69  --   CALCIUM 8.6*   --  8.3*  --    < > = values in this interval not displayed.    PT/INR:  Recent Labs    11/19/20 0436  LABPROT 15.9*  INR 1.3*   ABG    Component Value Date/Time   PHART 7.466 (H) 11/19/2020 0611   HCO3 27.5 11/19/2020 0611   TCO2 29 11/19/2020 0611   ACIDBASEDEF 6.0 (H) 11/24/2019 1718   O2SAT 96.0 11/19/2020 0611   CBG (last 3)  Recent Labs    11/18/20 2356 11/19/20 0435 11/19/20 0730  GLUCAP 145* 81 73    Assessment/Plan: S/P Procedure(s) (LRB): REDO STERNOTOMY (N/A) INSERTION OF IMPLANTABLE LEFT VENTRICULAR ASSIST DEVICE - HM3 (N/A) TRANSESOPHAGEAL ECHOCARDIOGRAM (TEE) (N/A) Mobilize Diuresis begin warfarin tonight  Agree with drip wean Will reconsider chest tubes later today--may be able to get 1 or 2 out.   LOS: 11 days    [name] 11/19/2020

## 2020-11-19 NOTE — Progress Notes (Signed)
VAD Dressing change note:   LVAD dressing changed using sterile technique and proper LVAD dressing change protocol by this RN. The old dressing was clean and intact with no drainage. The driveline exit site clean, slightly pink at edges of skin. No swelling. No exudate or drainage present. New dressing placed using proper sterile technique and is clean, dry, and intact.   NEXT DRESSING CHANGE DUE 11/20/2020  Tenna Child RN.

## 2020-11-19 NOTE — Progress Notes (Signed)
Patient ID: Kevin Underwood, male   DOB: 1961-08-27, 59 y.o.   MRN: [id number]     Advanced Heart Failure Rounding Note  PCP-Cardiologist: Nona Dell, MD   Subjective:    - 12/2 Had cath as noted below with intact revascularization. CO-OX 39%, started on milrinone.  - 12/3 Impella 5.5 placed - 12/6 Impella speed increased to P8. Started on lasix drip and given metolazone. Brisk diuresis noted. Negative 5 liters.  - 12/8 Swan removed, PICC placed pre-LVAD.  - 12/10 Impella removed, Heartmate 3 LVAD placed.  - 12/11 Extubated  POD #2.  Patient up in chair this morning.  Pressor requirement up and down with pain meds and sleeping.  On epinephrine 4, NE 4, milrinone 0.25, amiodarone 30.  I/Os positive yesterday.  MAP currently in 70s but drops with sedation (pain meds).   Swan CVP 12 PA 33/14 CI 2.6 Co-ox 44% (repeating)  RHC/LHC 11/09/20  RA 20  PCWP 30-35 PA Sat 40% CO 3 CI 1.5.  1. Severe native coronary artery disease, similar to prior catheterization in 11/2019.  2. Widely patent LIMA-LAD, SVG-D1, and sequential SVG-OM3-rPDA. 3. Severely elevated left and right heart filling pressures. 4. Severely reduced cardiac output/index. 5. Successful placement of leave-in PA catheter via the right internal jugular vein.  LVAD Interrogation HM 3: Speed: 5400 Flow: 4.3 PI: 3.0 Power: 3.8. 1 PI event.   LDH 356 => 340  Objective:   Weight Range: 88.7 kg Body mass index is 28.06 kg/m.   Vital Signs:   Temp:  [98.42 F (36.9 C)-100.4 F (38 C)] 99.14 F (37.3 C) (12/12 0630) Pulse Rate:  [79-86] 86 (12/12 0000) Resp:  [0-25] 16 (12/12 0630) BP: (79-101)/(64-80) 98/64 (12/11 1800) SpO2:  [89 %-100 %] 95 % (12/12 0630) Arterial Line BP: (62-127)/(47-82) 73/59 (12/12 0630) FiO2 (%):  [40 %] 40 % (12/11 1211) Weight:  [88.7 kg] 88.7 kg (12/12 0500) Last BM Date: 11/11/20  Weight change: Filed Weights   11/17/20 0444 11/18/20 0343 11/19/20 0500  Weight: 77.9 kg 86.2 kg  88.7 kg    Intake/Output:   Intake/Output Summary (Last 24 hours) at 11/19/2020 0739 Last data filed at 11/19/2020 0600 Gross per 24 hour  Intake 4402.77 ml  Output 2810 ml  Net 1592.77 ml      Physical Exam  CVP 12 General: Well appearing this am. NAD.  HEENT: Normal. Neck: Supple, JVP 12 cm. Carotids OK.  Cardiac:  Mechanical heart sounds with LVAD hum present.  Lungs:  CTAB, normal effort.  Abdomen:  NT, ND, no HSM. No bruits or masses. +BS  LVAD exit site: Well-healed and incorporated. Dressing dry and intact. No erythema or drainage. Stabilization device present and accurately applied. Driveline dressing changed daily per sterile technique. Extremities:  Warm and dry. No cyanosis, clubbing, rash, or edema.  Neuro:  Alert & oriented x 3. Cranial nerves grossly intact. Moves all 4 extremities w/o difficulty. Affect pleasant      Telemetry   NSR 70s    Labs    CBC Recent Labs    11/18/20 0327 11/18/20 0334 11/18/20 1619 11/18/20 1627 11/19/20 0436 11/19/20 0611  WBC 13.0*  --  12.8*  --  11.4*  --   NEUTROABS 8.9*  --   --   --  7.3  --   HGB 8.4*   < > 8.2*   < > 8.2* 7.8*  HCT 25.6*   < > 24.4*   < > 24.5* 23.0*  MCV 87.7  --  87.5  --  87.8  --   PLT 169  --  161  --  144*  --    < > = values in this interval not displayed.   Basic Metabolic Panel Recent Labs    73/71/06 0327 11/18/20 0334 11/18/20 1619 11/18/20 1627 11/19/20 0436 11/19/20 0611  NA 137   < > 134*   < > 131* 132*  K 4.1   < > 3.8   < > 3.7 3.7  CL 101  --  99  --  97*  --   CO2 26  --  27  --  26  --   GLUCOSE 118*  --  98  --  77  --   BUN 12  --  12  --  13  --   CREATININE 0.73  --  0.72  --  0.69  --   CALCIUM 8.4*  --  8.6*  --  8.3*  --   MG 2.3  --  2.3  --  2.2  --   PHOS 4.6  --   --   --  3.0  --    < > = values in this interval not displayed.   Liver Function Tests Recent Labs    11/18/20 0327 11/19/20 0436  AST 59* 47*  ALT 21 22  ALKPHOS 44 51  BILITOT  1.2 1.1  PROT 5.2* 5.0*  ALBUMIN 3.2* 2.6*   No results for input(s): LIPASE, AMYLASE in the last 72 hours. Cardiac Enzymes No results for input(s): CKTOTAL, CKMB, CKMBINDEX, TROPONINI in the last 72 hours.  BNP: BNP (last 3 results) Recent Labs    09/22/20 0348 11/08/20 1344 11/18/20 0327  BNP 939.0* 910.0* 392.0*    ProBNP (last 3 results) No results for input(s): PROBNP in the last 8760 hours.   D-Dimer Recent Labs    11/17/20 1503  DDIMER 2.01*   Hemoglobin A1C No results for input(s): HGBA1C in the last 72 hours. Fasting Lipid Panel Recent Labs    11/17/20 2013  TRIG 44   Thyroid Function Tests No results for input(s): TSH, T4TOTAL, T3FREE, THYROIDAB in the last 72 hours.  Invalid input(s): FREET3  Other results:   Imaging    No results found.   Medications:     Scheduled Medications: . aspirin EC  325 mg Oral Daily   Or  . aspirin  324 mg Per Tube Daily   Or  . aspirin  300 mg Rectal Daily  . bisacodyl  10 mg Oral Daily   Or  . bisacodyl  10 mg Rectal Daily  . chlorhexidine  15 mL Mouth Rinse BID  . Chlorhexidine Gluconate Cloth  6 each Topical Daily  . colchicine  0.3 mg Oral BID  . docusate sodium  200 mg Oral Daily  . furosemide  40 mg Intravenous Once  . insulin aspart  2-6 Units Subcutaneous Q4H  . insulin detemir  10 Units Subcutaneous Q12H  . ipratropium-albuterol  3 mL Nebulization BID  . ketorolac  7.5 mg Intravenous Q6H  . mouth rinse  15 mL Mouth Rinse q12n4p  . mometasone-formoterol  2 puff Inhalation BID  . multivitamin with minerals  1 tablet Oral Daily  . pantoprazole  40 mg Oral QHS  . potassium chloride  20 mEq Oral Q4H  . potassium chloride  40 mEq Oral Once  . rosuvastatin  40 mg Oral q1800  . sodium chloride flush  10-40 mL Intracatheter Q12H  . sodium  chloride flush  10-40 mL Intracatheter Q12H  . sodium chloride flush  3 mL Intravenous Q12H    Infusions: . sodium chloride Stopped (11/19/20 0545)  . sodium  chloride    . sodium chloride 10 mL/hr at 11/19/20 0600  . amiodarone 30 mg/hr (11/19/20 0600)  . dexmedetomidine (PRECEDEX) IV infusion 0.7 mcg/kg/hr (11/19/20 0659)  . epinephrine 4 mcg/min (11/19/20 0600)  . furosemide (LASIX) 200 mg in dextrose 5% 100 mL (2mg /mL) infusion    . insulin Stopped (11/18/20 1628)  . lactated ringers 10 mL/hr at 11/17/20 0700  . lactated ringers    . lactated ringers 20 mL/hr at 11/19/20 0600  . milrinone 0.25 mcg/kg/min (11/19/20 0600)  . nitroGLYCERIN 0 mcg/min (11/17/20 1445)  . norepinephrine (LEVOPHED) Adult infusion 5 mcg/min (11/19/20 0600)  . vasopressin      PRN Medications: sodium chloride, acetaminophen, albuterol, aminocaproic acid, dextrose, haloperidol lactate, morphine injection, morphine injection, ondansetron (ZOFRAN) IV, oxyCODONE, sodium chloride flush, sodium chloride flush, sodium chloride flush, traMADol    Patient Profile   FLEET HIGHAM a [place] [age] y.o.malewith a history of systolic HF, 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 type 2 diabetes mellitus, COPD, and hypertension. Now s/p Heartmate 3 LVAD.   Assessment/Plan   1. Acute on chronic systolic CHF with cardiogenic shock: Echo 10/21 with EF 20-25%, mildly decreased RV function (persistently low). LHC/RHC this admission with patent grafts, low output. Suspect mixed ischemic/nonischemic cardiomyopathy (prior heavy ETOH and drugs as well as CAD).  No ETOH, drugs, smoking for about 1 year since CABG in 12/20. NYHA class IV at admission.  Had placement of Impella 5.5 initially, now s/p Heartmate 3 LVAD on 12/10.  This morning, remains on epinephrine 4, norepinephrine 4, milrinone 0.25.  CI 2.6 by 14/10 though early am co-ox low at 44%, CVP 12 with I/Os positive.   - LVAD speed increased to 5500 rpm.  - Wean norepinephrine, then slow wean of  epinephrine.  MAP improved when he is more awake.  - Lasix 40 mg IV x 1 then 8 mg/hr infusion.   - Continue ASA 325 mg daily.  Begin warfarin when stable for this per surgeon.   2. Acute hypoxemic respiratory failure: Now extubated and stable.  3. CAD: S/p CABG 12/20.  LHC this admission with patent grafts, no target for intervention. STE on telemetry though no ischemic-type CP.  - Continue ASA and statin.  - ECG.  4. Atrial fibrillation: Paroxysmal.  S/p Maze and LA appendage clip with CABG in 12/20.  In NSR.  - Continue amiodarone gtt post-op.  - Start anticoagulation when ok per surgery.  5. Type 2 diabetes: A1c 10.4, poor control.  Continue insulin.  6. Prior ETOH, drugs: None x 1 year.  7. Anemia: Post-op, transfuse hgb < 8.    8. COPD: PFTs showed FEV1 1.79 (limited study due to LVAD).   CRITICAL CARE Performed by: 1/21  Total critical care time: 40 minutes  Critical care time was exclusive of separately billable procedures and treating other patients.  Critical care was necessary to treat or prevent imminent or life-threatening deterioration.  Critical care was time spent personally by me on the following activities: development of treatment plan with patient and/or surrogate as well as nursing, discussions with consultants, evaluation of patient's response to treatment, examination of patient, obtaining history from patient or surrogate, ordering and performing  treatments and interventions, ordering and review of laboratory studies, ordering and review of radiographic studies, pulse oximetry and re-evaluation of patient's condition.    Length of Stay: 18  Marca Ancona, MD  11/19/2020, 7:39 AM  Advanced Heart Failure Team Pager [phone] (M-F; 7a - 4p)  Please contact [facility] for night-coverage after hours (4p -7a ) and weekends on [email or web address]

## 2020-11-19 NOTE — Evaluation (Signed)
Occupational Therapy Evaluation Patient Details Name: Kevin Underwood MRN: [id number] DOB: [birth year] Today's Date: 11/19/2020    History of Present Illness Kevin Underwood is a 59 y.o. 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 Pt adm 12/01 with Acute on chronic systolic CHF with cardiogenic shock. Had impella placed 12/3. Underwent LVAD on 11/17/20.   Clinical Impression   Pt admitted with above. She demonstrates the below listed deficits and will benefit from continued OT to maximize safety and independence with BADLs.   He was seen in conjunction with PT and was able to stand with min A +2.  He requires set up to total A for ADLs.  Education started re: precautions, safety with his drive line, and securing controller when moving.  He reports he lives with his wife and was independent PTA.  Anticipate good progress.       Follow Up Recommendations  Home health OT;Supervision/Assistance - 24 hour    Equipment Recommendations  Tub/shower seat;3 in 1 bedside commode    Recommendations for Other Services       Precautions / Restrictions Precautions Precautions: Fall;Sternal;Other (comment) (LVAD) Precaution Comments: Began education re: driveline safety and securing system controller Restrictions Weight Bearing Restrictions: No      Mobility Bed Mobility               General bed mobility comments: Pt sitting up in recliner    Transfers Overall transfer level: Needs assistance Equipment used: 4-wheeled walker Transfers: Sit to/from Stand Sit to Stand: Min assist;+2 physical assistance         General transfer comment: Assist to bring hips up and for stability. Also assist for management of lines    Balance Overall balance assessment: Needs assistance Sitting-balance support: No upper extremity  supported;Feet supported Sitting balance-Leahy Scale: Fair     Standing balance support: Bilateral upper extremity supported;During functional activity Standing balance-Leahy Scale: Poor Standing balance comment: UE support of rollator                           ADL either performed or assessed with clinical judgement   ADL Overall ADL's : Needs assistance/impaired Eating/Feeding: Independent   Grooming: Wash/dry hands;Wash/dry face;Oral care;Set up;Sitting   Upper Body Bathing: Moderate assistance;Sitting   Lower Body Bathing: Maximal assistance;Sit to/from stand   Upper Body Dressing : Maximal assistance;Sitting   Lower Body Dressing: Total assistance;Sit to/from stand   Toilet Transfer: Moderate assistance;+2 for physical assistance;+2 for safety/equipment;Stand-pivot;BSC   Toileting- Clothing Manipulation and Hygiene: Total assistance;Sit to/from stand       Functional mobility during ADLs: Moderate assistance;+2 for physical assistance;+2 for safety/equipment;Rolling walker General ADL Comments: limited by pain, and multiple lines     Vision Patient Visual Report: No change from baseline       Perception     Praxis      Pertinent Vitals/Pain Pain Assessment: Faces Faces Pain Scale: Hurts whole lot Pain Location: abdomen Pain Descriptors / Indicators: Grimacing Pain Intervention(s): Limited activity within patient's tolerance;Monitored during session;Repositioned     Hand Dominance Right   Extremity/Trunk Assessment Upper Extremity Assessment Upper Extremity Assessment: Generalized weakness   Lower Extremity Assessment Lower Extremity Assessment: Defer to PT evaluation   Cervical / Trunk Assessment Cervical / Trunk Assessment: Normal   Communication Communication  Communication: No difficulties   Cognition Arousal/Alertness: Awake/alert Behavior During Therapy: Anxious Overall Cognitive Status: Within Functional Limits for tasks  assessed                                 General Comments: Grossly assessed   General Comments  VSS. Pt on 2L O2    Exercises     Shoulder Instructions      Home Living Family/patient expects to be discharged to:: Private residence Living Arrangements: Spouse/significant other Available Help at Discharge: Family;Available 24 hours/day Type of Home: Apartment Home Access: Stairs to enter [employer] of Steps: flight Entrance Stairs-Rails: Right Home Layout: One level     Bathroom Shower/Tub: Chief Strategy Officer: Standard Bathroom Accessibility: Yes   Home Equipment: Emergency planning/management officer - 2 wheels   Additional Comments: did not use equipment before admit      Prior Functioning/Environment Level of Independence: Independent        Comments: Pt reports he plans to go home with wife at discharge        OT Problem List: Decreased strength;Decreased activity tolerance;Impaired balance (sitting and/or standing);Decreased safety awareness;Decreased knowledge of use of DME or AE;Decreased knowledge of precautions;Cardiopulmonary status limiting activity;Pain      OT Treatment/Interventions: Self-care/ADL training;Therapeutic exercise;DME and/or AE instruction;Therapeutic activities;Patient/family education;Balance training    OT Goals(Current goals can be found in the care plan section) Acute Rehab OT Goals Patient Stated Goal: to get stronger OT Goal Formulation: With patient Time For Goal Achievement: 12/03/20 Potential to Achieve Goals: Good ADL Goals Pt Will Perform Grooming: with min guard assist;standing Pt Will Perform Upper Body Bathing: with set-up;with supervision;sitting Pt Will Perform Lower Body Bathing: with set-up;with supervision;sit to/from stand Pt Will Perform Upper Body Dressing: with set-up;with supervision;sitting Pt Will Perform Lower Body Dressing: with min guard assist;sit to/from stand;with adaptive  equipment Pt Will Transfer to Toilet: with min guard assist;ambulating;regular height toilet;bedside commode Pt Will Perform Toileting - Clothing Manipulation and hygiene: with min guard assist;sit to/from stand Additional ADL Goal #1: Pt will be independent with precautions with ADLs Additional ADL Goal #2: Pt will be able to manage LVAD equipment during ADLs with supervision  OT Frequency: Min 2X/week   Barriers to D/C:            Co-evaluation PT/OT/SLP Co-Evaluation/Treatment: Yes Reason for Co-Treatment: Complexity of the patient's impairments (multi-system involvement);Necessary to address cognition/behavior during functional activity;For patient/therapist safety   OT goals addressed during session: Strengthening/ROM      AM-PAC OT "6 Clicks" Daily Activity     Outcome Measure Help from another person eating meals?: None Help from another person taking care of personal grooming?: A Little Help from another person toileting, which includes using toliet, bedpan, or urinal?: A Lot Help from another person bathing (including washing, rinsing, drying)?: A Lot Help from another person to put on and taking off regular upper body clothing?: A Lot Help from another person to put on and taking off regular lower body clothing?: Total 6 Click Score: 14   End of Session Equipment Utilized During Treatment: Rolling walker;Oxygen Nurse Communication: Mobility status;Precautions  Activity Tolerance: Patient tolerated treatment well Patient left: in chair;with call bell/phone within reach;with nursing/sitter in room  OT Visit Diagnosis: Unsteadiness on feet (R26.81);Pain Pain - part of body:  (abdomen after eating)                Time: 4742-5956  OT Time Calculation (min): 31 min Charges:  OT General Charges $OT Visit: 1 Visit OT Evaluation $OT Eval High Complexity: 1 High  Eber Jones., OTR/L Acute Rehabilitation Services Pager [phone] Office [phone]   Jeani Hawking  M 11/19/2020, 1:45 PM

## 2020-11-20 ENCOUNTER — Encounter (HOSPITAL_COMMUNITY): Payer: Self-pay | Admitting: Cardiothoracic Surgery

## 2020-11-20 ENCOUNTER — Inpatient Hospital Stay (HOSPITAL_COMMUNITY): Payer: 59

## 2020-11-20 ENCOUNTER — Inpatient Hospital Stay: Payer: Self-pay

## 2020-11-20 DIAGNOSIS — I5043 Acute on chronic combined systolic (congestive) and diastolic (congestive) heart failure: Secondary | ICD-10-CM

## 2020-11-20 DIAGNOSIS — I5022 Chronic systolic (congestive) heart failure: Secondary | ICD-10-CM

## 2020-11-20 LAB — POCT I-STAT 7, (LYTES, BLD GAS, ICA,H+H)
Acid-Base Excess: 5 mmol/L — ABNORMAL HIGH (ref 0.0–2.0)
Bicarbonate: 27.9 mmol/L (ref 20.0–28.0)
Calcium, Ion: 1.18 mmol/L (ref 1.15–1.40)
HCT: 27 % — ABNORMAL LOW (ref 39.0–52.0)
Hemoglobin: 9.2 g/dL — ABNORMAL LOW (ref 13.0–17.0)
O2 Saturation: 96 %
Patient temperature: 98
Potassium: 3.4 mmol/L — ABNORMAL LOW (ref 3.5–5.1)
Sodium: 133 mmol/L — ABNORMAL LOW (ref 135–145)
TCO2: 29 mmol/L (ref 22–32)
pCO2 arterial: 35.1 mmHg (ref 32.0–48.0)
pH, Arterial: 7.508 — ABNORMAL HIGH (ref 7.350–7.450)
pO2, Arterial: 72 mmHg — ABNORMAL LOW (ref 83.0–108.0)

## 2020-11-20 LAB — COOXEMETRY PANEL
Carboxyhemoglobin: 1.9 % — ABNORMAL HIGH (ref 0.5–1.5)
Methemoglobin: 0.9 % (ref 0.0–1.5)
O2 Saturation: 57.4 %
Total hemoglobin: 9.4 g/dL — ABNORMAL LOW (ref 12.0–16.0)

## 2020-11-20 LAB — GLUCOSE, CAPILLARY
Glucose-Capillary: 144 mg/dL — ABNORMAL HIGH (ref 70–99)
Glucose-Capillary: 149 mg/dL — ABNORMAL HIGH (ref 70–99)
Glucose-Capillary: 71 mg/dL (ref 70–99)
Glucose-Capillary: 76 mg/dL (ref 70–99)
Glucose-Capillary: 80 mg/dL (ref 70–99)
Glucose-Capillary: 95 mg/dL (ref 70–99)

## 2020-11-20 LAB — POTASSIUM: Potassium: 3.6 mmol/L (ref 3.5–5.1)

## 2020-11-20 LAB — COMPREHENSIVE METABOLIC PANEL
ALT: 22 U/L (ref 0–44)
AST: 29 U/L (ref 15–41)
Albumin: 2.5 g/dL — ABNORMAL LOW (ref 3.5–5.0)
Alkaline Phosphatase: 64 U/L (ref 38–126)
Anion gap: 8 (ref 5–15)
BUN: 11 mg/dL (ref 6–20)
CO2: 27 mmol/L (ref 22–32)
Calcium: 8 mg/dL — ABNORMAL LOW (ref 8.9–10.3)
Chloride: 96 mmol/L — ABNORMAL LOW (ref 98–111)
Creatinine, Ser: 0.56 mg/dL — ABNORMAL LOW (ref 0.61–1.24)
GFR, Estimated: >60 mL/min (ref >60)
Glucose, Bld: 104 mg/dL — ABNORMAL HIGH (ref 70–99)
Potassium: 3.3 mmol/L — ABNORMAL LOW (ref 3.5–5.1)
Sodium: 131 mmol/L — ABNORMAL LOW (ref 135–145)
Total Bilirubin: 0.7 mg/dL (ref 0.3–1.2)
Total Protein: 4.9 g/dL — ABNORMAL LOW (ref 6.5–8.1)

## 2020-11-20 LAB — CBC WITH DIFFERENTIAL/PLATELET
Abs Immature Granulocytes: 0.04 10*3/uL (ref 0.00–0.07)
Basophils Absolute: 0 10*3/uL (ref 0.0–0.1)
Basophils Relative: 0 %
Eosinophils Absolute: 0.4 10*3/uL (ref 0.0–0.5)
Eosinophils Relative: 4 %
HCT: 26.7 % — ABNORMAL LOW (ref 39.0–52.0)
Hemoglobin: 9 g/dL — ABNORMAL LOW (ref 13.0–17.0)
Immature Granulocytes: 1 %
Lymphocytes Relative: 14 %
Lymphs Abs: 1.1 10*3/uL (ref 0.7–4.0)
MCH: 29 pg (ref 26.0–34.0)
MCHC: 33.7 g/dL (ref 30.0–36.0)
MCV: 86.1 fL (ref 80.0–100.0)
Monocytes Absolute: 1.1 10*3/uL — ABNORMAL HIGH (ref 0.1–1.0)
Monocytes Relative: 14 %
Neutro Abs: 5.5 10*3/uL (ref 1.7–7.7)
Neutrophils Relative %: 67 %
Platelets: 132 10*3/uL — ABNORMAL LOW (ref 150–400)
RBC: 3.1 MIL/uL — ABNORMAL LOW (ref 4.22–5.81)
RDW: 15.5 % (ref 11.5–15.5)
WBC: 8.2 10*3/uL (ref 4.0–10.5)
nRBC: 0 % (ref 0.0–0.2)

## 2020-11-20 LAB — PROTIME-INR
INR: 1.3 — ABNORMAL HIGH (ref 0.8–1.2)
Prothrombin Time: 15.9 seconds — ABNORMAL HIGH (ref 11.4–15.2)

## 2020-11-20 LAB — LACTATE DEHYDROGENASE: LDH: 297 U/L — ABNORMAL HIGH (ref 98–192)

## 2020-11-20 LAB — MAGNESIUM: Magnesium: 1.9 mg/dL (ref 1.7–2.4)

## 2020-11-20 LAB — PHOSPHORUS: Phosphorus: 2.5 mg/dL (ref 2.5–4.6)

## 2020-11-20 LAB — TRIGLYCERIDES: Triglycerides: 69 mg/dL (ref <150)

## 2020-11-20 LAB — SURGICAL PATHOLOGY

## 2020-11-20 MED ORDER — POTASSIUM CHLORIDE CRYS ER 20 MEQ PO TBCR
40.0000 meq | EXTENDED_RELEASE_TABLET | Freq: Once | ORAL | Status: AC
  Administered 2020-11-20: 13:00:00 40 meq via ORAL
  Filled 2020-11-20: qty 2

## 2020-11-20 MED ORDER — GLUCERNA SHAKE PO LIQD
237.0000 mL | Freq: Three times a day (TID) | ORAL | Status: DC
  Administered 2020-11-20 – 2020-11-27 (×14): 237 mL via ORAL

## 2020-11-20 MED ORDER — POTASSIUM CHLORIDE 10 MEQ/50ML IV SOLN
10.0000 meq | INTRAVENOUS | Status: AC
  Administered 2020-11-20 (×3): 10 meq via INTRAVENOUS
  Filled 2020-11-20 (×3): qty 50

## 2020-11-20 MED ORDER — WARFARIN SODIUM 2.5 MG PO TABS
2.5000 mg | ORAL_TABLET | Freq: Once | ORAL | Status: AC
  Administered 2020-11-20: 2.5 mg via ORAL
  Filled 2020-11-20: qty 1

## 2020-11-20 MED ORDER — POTASSIUM CHLORIDE CRYS ER 20 MEQ PO TBCR
40.0000 meq | EXTENDED_RELEASE_TABLET | Freq: Once | ORAL | Status: AC
  Administered 2020-11-20: 10:00:00 40 meq via ORAL
  Filled 2020-11-20: qty 2

## 2020-11-20 MED ORDER — METOLAZONE 2.5 MG PO TABS
2.5000 mg | ORAL_TABLET | Freq: Once | ORAL | Status: AC
  Administered 2020-11-20: 10:00:00 2.5 mg via ORAL
  Filled 2020-11-20: qty 1

## 2020-11-20 MED ORDER — COLCHICINE 0.6 MG PO TABS
0.6000 mg | ORAL_TABLET | Freq: Two times a day (BID) | ORAL | Status: DC
  Administered 2020-11-20 – 2020-11-28 (×17): 0.6 mg via ORAL
  Filled 2020-11-20 (×18): qty 1

## 2020-11-20 MED ORDER — POTASSIUM CHLORIDE CRYS ER 20 MEQ PO TBCR
20.0000 meq | EXTENDED_RELEASE_TABLET | ORAL | Status: AC
Start: 2020-11-20 — End: 2020-11-21
  Administered 2020-11-20 – 2020-11-21 (×3): 20 meq via ORAL
  Filled 2020-11-20 (×3): qty 1

## 2020-11-20 MED ORDER — PROSOURCE PLUS PO LIQD
30.0000 mL | Freq: Two times a day (BID) | ORAL | Status: DC
  Administered 2020-11-21 (×2): 30 mL via ORAL
  Filled 2020-11-20: qty 30

## 2020-11-20 MED ORDER — MILRINONE LACTATE IN DEXTROSE 20-5 MG/100ML-% IV SOLN
0.1250 ug/kg/min | INTRAVENOUS | Status: DC
  Administered 2020-11-20 – 2020-11-21 (×2): 0.125 ug/kg/min via INTRAVENOUS
  Filled 2020-11-20 (×2): qty 100

## 2020-11-20 MED ORDER — ALUM & MAG HYDROXIDE-SIMETH 200-200-20 MG/5ML PO SUSP
30.0000 mL | Freq: Four times a day (QID) | ORAL | Status: DC | PRN
  Administered 2020-11-20: 17:00:00 30 mL via ORAL
  Filled 2020-11-20: qty 30

## 2020-11-20 MED ORDER — HEPARIN (PORCINE) 25000 UT/250ML-% IV SOLN
500.0000 [IU]/h | INTRAVENOUS | Status: DC
  Administered 2020-11-20 – 2020-11-22 (×3): 500 [IU]/h via INTRAVENOUS
  Filled 2020-11-20 (×2): qty 250

## 2020-11-20 MED ORDER — LIDOCAINE 5 % EX PTCH
1.0000 | MEDICATED_PATCH | CUTANEOUS | Status: DC
  Administered 2020-11-21 – 2020-11-27 (×7): 1 via TRANSDERMAL
  Filled 2020-11-20 (×9): qty 1

## 2020-11-20 MED FILL — Heparin Sodium (Porcine) Inj 1000 Unit/ML: INTRAMUSCULAR | Qty: 10 | Status: AC

## 2020-11-20 MED FILL — Thrombin For Soln 5000 Unit: CUTANEOUS | Qty: 5000 | Status: AC

## 2020-11-20 MED FILL — Mannitol IV Soln 20%: INTRAVENOUS | Qty: 500 | Status: AC

## 2020-11-20 MED FILL — Potassium Chloride Inj 2 mEq/ML: INTRAVENOUS | Qty: 40 | Status: AC

## 2020-11-20 MED FILL — Magnesium Sulfate Inj 50%: INTRAMUSCULAR | Qty: 10 | Status: AC

## 2020-11-20 MED FILL — Calcium Chloride Inj 10%: INTRAVENOUS | Qty: 10 | Status: AC

## 2020-11-20 MED FILL — Sodium Chloride IV Soln 0.9%: INTRAVENOUS | Qty: 2000 | Status: AC

## 2020-11-20 MED FILL — Electrolyte-R (PH 7.4) Solution: INTRAVENOUS | Qty: 4000 | Status: AC

## 2020-11-20 MED FILL — Heparin Sodium (Porcine) Inj 1000 Unit/ML: INTRAMUSCULAR | Qty: 30 | Status: AC

## 2020-11-20 MED FILL — Sodium Bicarbonate IV Soln 8.4%: INTRAVENOUS | Qty: 50 | Status: AC

## 2020-11-20 NOTE — Progress Notes (Signed)
Nutrition Follow-up  DOCUMENTATION CODES:   Non-severe (moderate) malnutrition in context of chronic illness  INTERVENTION:   Glucerna Shake po TID, each supplement provides 220 kcal and 10 grams of protein  30 ml ProSource Plus BID, each supplement provides 100 kcals and 15 grams protein.   Recommend Dysphagia 3 (easy to chew) diet once advanced  Consider increasing bowel regimen given no recent BM   NUTRITION DIAGNOSIS:   Moderate Malnutrition related to chronic illness (uncontrolled DM, CHF) as evidenced by mild fat depletion,moderate muscle depletion,energy intake < 75% for > or equal to 1 month.  Being addressed via supplements  GOAL:   Patient will meet greater than or equal to 90% of their needs  Progressing   MONITOR:   PO intake,Supplement acceptance,Weight trends,Labs,I & O's  REASON FOR ASSESSMENT:   Consult LVAD Eval  ASSESSMENT:   59 year old male who presented to the ED on 12/01 with chest pain. PMH of CAD s/p CABG December 2020, CHF, HTN, HLD, EtOH and cocaine abuse (substance-free over the last year), T2DM, COPD.   12/02- right/left heart cath 12/03- impella 12/09- dentral extraction, 13 teeth removed 12/10- HM3 LVAD implantation, redo sternotomy  Tolerating FL diet. Pt reports he was drinking Glucerna shakes prior to procedure and prefers these over Ensure. Pt agreeable to resuming Glucerna shakes  Pt denies any pain in mouth post dental extraction.   Pt denies N/V but is constipated  Labs: reviewed Meds: colace, dulcolax,  lasix gtt, ss novolog, levemir, MVI with Minerals  Diet Order:   Diet Order            Diet full liquid Room service appropriate? Yes; Fluid consistency: Thin  Diet effective now                 EDUCATION NEEDS:   Education needs have been addressed  Skin:  Skin Assessment: Skin Integrity Issues: Skin Integrity Issues:: Incisions Incisions: L chest  Last BM:  12/4  Height:   Ht Readings from Last 1  Encounters:  11/16/20 5\' 10"  (1.778 m)    Weight:   Wt Readings from Last 1 Encounters:  11/20/20 90.7 kg    Ideal Body Weight:  75.5 kg  BMI:  Body mass index is 28.69 kg/m.  Estimated Nutritional Needs:   Kcal:  2300-2500  Protein:  115-130 grams  Fluid:  2.0 L/day  11/22/20 MS, RDN, LDN, CNSC Registered Dietitian III Clinical Nutrition RD Pager and On-Call Pager Number Located in [place]

## 2020-11-20 NOTE — Addendum Note (Signed)
Addendum  created 11/20/20 1106 by Adair Laundry, CRNA   Order list changed, Pharmacy for encounter modified

## 2020-11-20 NOTE — Progress Notes (Signed)
CSW met at bedside with patient. He reports he is feeling good and walked the halls today. Patient appears very motivated for recovery and states all is well. He reports that his social situation at home is resolved and grateful to the team for the support and assistance. CSW continues to follow for support and assistance throughout implant hospitalization. Raquel Sarna, [place], [place]

## 2020-11-20 NOTE — Progress Notes (Signed)
ANTICOAGULATION CONSULT NOTE   Pharmacy Consult for Warfarin + heparin drip Indication: atrial fibrillation, new Heart Mate III LVAD implant   No Known Allergies  Patient Measurements: Height: 5\' 10"  (177.8 cm) Weight: 90.7 kg (199 lb 15.3 oz) IBW/kg (Calculated) : 73 Heparin Dosing Weight: 82kg  Vital Signs: Temp: 98.2 F (36.8 C) (12/13 1100) Temp Source: Core (12/13 1100) BP: 73/61 (12/13 0900) Pulse Rate: 25 (12/13 0900)  Labs: Recent Labs    11/17/20 1503 11/17/20 1509 11/18/20 0327 11/18/20 0334 11/18/20 1619 11/18/20 1627 11/19/20 0436 11/19/20 0611 11/20/20 0359 11/20/20 0438  HGB 7.9*   < > 8.4*   < > 8.2*   < > 8.2* 7.8* 9.0* 9.2*  HCT 24.3*   < > 25.6*   < > 24.4*   < > 24.5* 23.0* 26.7* 27.0*  PLT 189   186   < > 169  --  161  --  144*  --  132*  --   APTT 32   32  --   --   --   --   --   --   --   --   --   LABPROT 15.3*   15.3*  --  15.2  --   --   --  15.9*  --  15.9*  --   INR 1.3*   1.3*  --  1.2  --   --   --  1.3*  --  1.3*  --   CREATININE 0.69   < > 0.73  --  0.72  --  0.69  --  0.56*  --    < > = values in this interval not displayed.    Estimated Creatinine Clearance: 112.6 mL/min (A) (by C-G formula based on SCr of 0.56 mg/dL (L)).   Medical History: Past Medical History:  Diagnosis Date   Arthritis    CAD (coronary artery disease)    a. s/p CABG in 11/2019 with LIMA-LAD, SVG-OM1, SVG-PDA and SVG-D1   CHF (congestive heart failure) (HCC)    a. EF < 20% by echo in 11/2019   COPD (chronic obstructive pulmonary disease) (HCC)    Essential hypertension    PAF (paroxysmal atrial fibrillation) (HCC)    Type 2 diabetes mellitus (HCC)      Assessment: 63 yoM admitted with CP and cardiogenic shock. Pt has hx AFib with no AC PTA, pharmacy to start IV heparin.   Impella 5.5 placed 12/3. LVAD placed 12/10.   Started warfarin 12/12 post LVAD implant, INR 1.3 today. Will begin low dose heparin drip 500 uts/hr until INR close to  1.8 Hgb has been low stable postop at improved post PRBC, plt lower post op 140 - will monitor, LDH improved 350>290.   Goal of Therapy:  INR goal 2-2.5 Heparin level < 0.3 Monitor platelets by anticoagulation protocol: Yes   Plan:  Warfarin 2.5mg  tonight - repeat Begin heparin drip 500 uts/hr - no titrating Daily INR, CBC, heparin level Will provide education prior to discharge    14/12 Pharm.D. CPP, BCPS Clinical Pharmacist [phone] 11/20/2020 11:21 AM

## 2020-11-20 NOTE — Progress Notes (Signed)
Physical Therapy Treatment Patient Details Name: Kevin Underwood MRN: [id number] DOB: 1961/05/27 Today's Date: 11/20/2020    History of Present Illness Kevin Underwood is a 59 y.o. 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 Pt adm 12/01 with Acute on chronic systolic CHF with cardiogenic shock. Had impella placed 12/3. Underwent LVAD on 11/17/20.    PT Comments    Pt admitted with above diagnosis. Pt was able to ambulate on unit today with good distance and overall good stabilty with [name] walker and min guard to min assist.  +4 assisst for lines and safety with chair follow.  Pt should progress well once lines are decreased.  Pt starting to progress with learning LVAD equipment. Will follow acutely.  Pt currently with functional limitations due to balance and endurance deficits. Pt will benefit from skilled PT to increase their independence and safety with mobility to allow discharge to the venue listed below.     Follow Up Recommendations  Home health PT;Supervision/Assistance - 24 hour (May progress and not need HHPT)     Equipment Recommendations  Other (comment) (Possible rollator)    Recommendations for Other Services       Precautions / Restrictions Precautions Precautions: Fall;Sternal;Other (comment) (LVAD) Precaution Booklet Issued: Yes (comment) Precaution Comments: Began education re: driveline safety and securing system controller. Pt has LVAD, [place], chest tube and foley catheter and Oxygen Restrictions Weight Bearing Restrictions: No    Mobility  Bed Mobility Overal bed mobility: Needs Assistance Bed Mobility: Rolling;Sidelying to Sit Rolling: Min assist Sidelying to sit: Min assist;Mod assist       General bed mobility comments: needed min to mod assist to come to eOB due to pain and  lines  Transfers Overall transfer level: Needs assistance Equipment used:  [name] walker) Transfers: Sit to/from Stand Sit to Stand: Min assist;+2 safety/equipment         General transfer comment: Assist to bring hips up and for stability. Also assist for management of lines  Ambulation/Gait Ambulation/Gait assistance: Min assist;+2 safety/equipment Gait Distance (Feet): 170 Feet Assistive device:  (Eva walker) Gait Pattern/deviations: Step-through pattern;Decreased stride length;Trunk flexed   Gait velocity interpretation: <1.31 ft/sec, indicative of household ambulator General Gait Details: Pt walked unit with +4, one person for providing guard to min steadying assist, 2 persons for IV poles and 1 person for chair follow.  Pt made the full first loop and was fatigued once back to room.  Cues for pursed lip breathing and pt on 6L for ambulation.Pt on 2L at rest and once back in room.  Pts speed 5500 throughout.  PI 4.5 down to 2.7 with ambulation and other parameters within normal limits. Pt HR and O2 stable with O2 not always picking up.   Stairs             Wheelchair Mobility    Modified Rankin (Stroke Patients Only)       Balance Overall balance assessment: Needs assistance Sitting-balance support: No upper extremity supported;Feet supported Sitting balance-Leahy Scale: Fair     Standing balance support: Bilateral upper extremity supported;During functional activity Standing balance-Leahy Scale: Poor Standing balance comment: UE support of Eva walker                            Cognition Arousal/Alertness:  Awake/alert Behavior During Therapy: Anxious Overall Cognitive Status: Within Functional Limits for tasks assessed                                 General Comments: Needed repetition of cuing regarding equipment for LVAD      Exercises      General Comments        Pertinent Vitals/Pain Pain Assessment: Faces Faces Pain  Scale: Hurts whole lot Pain Location: abdomen Pain Descriptors / Indicators: Grimacing Pain Intervention(s): Limited activity within patient's tolerance;Monitored during session;Repositioned    Home Living                      Prior Function            PT Goals (current goals can now be found in the care plan section) Acute Rehab PT Goals Patient Stated Goal: to get stronger PT Goal Formulation: With patient Progress towards PT goals: Progressing toward goals    Frequency    Min 3X/week      PT Plan Current plan remains appropriate    Co-evaluation              AM-PAC PT "6 Clicks" Mobility   Outcome Measure  Help needed turning from your back to your side while in a flat bed without using bedrails?: A Little Help needed moving from lying on your back to sitting on the side of a flat bed without using bedrails?: A Little Help needed moving to and from a bed to a chair (including a wheelchair)?: A Little Help needed standing up from a chair using your arms (e.g., wheelchair or bedside chair)?: A Little Help needed to walk in hospital room?: A Little Help needed climbing 3-5 steps with a railing? : A Lot 6 Click Score: 17    End of Session Equipment Utilized During Treatment: Gait belt;Oxygen Activity Tolerance: Patient tolerated treatment well Patient left: with call bell/phone within reach;in chair;with chair alarm set;with family/visitor present Nurse Communication: Mobility status PT Visit Diagnosis: Muscle weakness (generalized) (M62.81);Other abnormalities of gait and mobility (R26.89)     Time: 6962-9528 PT Time Calculation (min) (ACUTE ONLY): 47 min  Charges:  $Gait Training: 23-37 mins $Self Care/Home Management: 8-22                     Rilyn Scroggs W,PT Acute Rehabilitation Services Pager:  [phone]  Office:  [phone]     Berline Lopes 11/20/2020, 2:06 PM

## 2020-11-20 NOTE — Progress Notes (Signed)
Patient ID: Kevin Underwood, male   DOB: 04/23/1961, 59 y.o.   MRN: [id number]     Advanced Heart Failure Rounding Note  PCP-Cardiologist: Nona Dell, MD   Subjective:    - 12/2 Had cath as noted below with intact revascularization. CO-OX 39%, started on milrinone.  - 12/3 Impella 5.5 placed - 12/6 Impella speed increased to P8. Started on lasix drip and given metolazone. Brisk diuresis noted. Negative 5 liters.  - 12/8 Swan removed, PICC placed pre-LVAD.  - 12/10 Impella removed, Heartmate 3 LVAD placed.  - 12/11 Extubated  POD #3.  Now on amiodarone 30 mg/hr and milrinone 0.25.  I/Os negative with Lasix gtt.  MAP currently in 80s.  Pain in right shoulder.   Swan CVP 16 PA 37/18 CI 2.8 Co-ox 57%   RHC/LHC 11/09/20  RA 20  PCWP 30-35 PA Sat 40% CO 3 CI 1.5.  1. Severe native coronary artery disease, similar to prior catheterization in 11/2019.  2. Widely patent LIMA-LAD, SVG-D1, and sequential SVG-OM3-rPDA. 3. Severely elevated left and right heart filling pressures. 4. Severely reduced cardiac output/index. 5. Successful placement of leave-in PA catheter via the right internal jugular vein.  LVAD Interrogation HM 3: Speed: 5500 Flow: 4.4 PI: 4 Power: 3.2. 8 PI events.   LDH 356 => 340 => 297  Objective:   Weight Range: 90.7 kg Body mass index is 28.69 kg/m.   Vital Signs:   Temp:  [96.8 F (36 C)-98.78 F (37.1 C)] 97.16 F (36.2 C) (12/13 0700) Pulse Rate:  [67-83] 76 (12/13 0733) Resp:  [12-30] 16 (12/13 0733) BP: (95)/(77) 95/77 (12/13 0600) SpO2:  [90 %-100 %] 97 % (12/13 0733) Arterial Line BP: (69-122)/(60-100) 109/89 (12/13 0407) FiO2 (%):  [28 %] 28 % (12/13 0733) Weight:  [90.7 kg] 90.7 kg (12/13 0500) Last BM Date: 11/11/20  Weight change: Filed Weights   11/18/20 0343 11/19/20 0500 11/20/20 0500  Weight: 86.2 kg 88.7 kg 90.7 kg    Intake/Output:   Intake/Output Summary (Last 24 hours) at 11/20/2020 0900 Last data filed at 11/20/2020  0600 Gross per 24 hour  Intake 2529.79 ml  Output 3025 ml  Net -495.21 ml      Physical Exam  CVP 16 General: Well appearing this am. NAD.  HEENT: Normal. Neck: Supple, JVP 14 cm. Carotids OK.  Cardiac:  Mechanical heart sounds with LVAD hum present.  Lungs:  CTAB, normal effort.  Abdomen:  NT, ND, no HSM. No bruits or masses. +BS  LVAD exit site: Well-healed and incorporated. Dressing dry and intact. No erythema or drainage. Stabilization device present and accurately applied. Driveline dressing changed daily per sterile technique. Extremities:  Warm and dry. No cyanosis, clubbing, rash. Trace ankle edema.  Neuro:  Alert & oriented x 3. Cranial nerves grossly intact. Moves all 4 extremities w/o difficulty. Affect pleasant     Telemetry   NSR 70s    Labs    CBC Recent Labs    11/19/20 0436 11/19/20 0611 11/20/20 0359 11/20/20 0438  WBC 11.4*  --  8.2  --   NEUTROABS 7.3  --  5.5  --   HGB 8.2*   < > 9.0* 9.2*  HCT 24.5*   < > 26.7* 27.0*  MCV 87.8  --  86.1  --   PLT 144*  --  132*  --    < > = values in this interval not displayed.   Basic Metabolic Panel Recent Labs    08/65/78 0436  11/19/20 0611 11/20/20 0359 11/20/20 0438  NA 131*   < > 131* 133*  K 3.7   < > 3.3* 3.4*  CL 97*  --  96*  --   CO2 26  --  27  --   GLUCOSE 77  --  104*  --   BUN 13  --  11  --   CREATININE 0.69  --  0.56*  --   CALCIUM 8.3*  --  8.0*  --   MG 2.2  --  1.9  --   PHOS 3.0  --  2.5  --    < > = values in this interval not displayed.   Liver Function Tests Recent Labs    11/19/20 0436 11/20/20 0359  AST 47* 29  ALT 22 22  ALKPHOS 51 64  BILITOT 1.1 0.7  PROT 5.0* 4.9*  ALBUMIN 2.6* 2.5*   No results for input(s): LIPASE, AMYLASE in the last 72 hours. Cardiac Enzymes No results for input(s): CKTOTAL, CKMB, CKMBINDEX, TROPONINI in the last 72 hours.  BNP: BNP (last 3 results) Recent Labs    09/22/20 0348 11/08/20 1344 11/18/20 0327  BNP 939.0* 910.0*  392.0*    ProBNP (last 3 results) No results for input(s): PROBNP in the last 8760 hours.   D-Dimer Recent Labs    11/17/20 1503  DDIMER 2.01*   Hemoglobin A1C No results for input(s): HGBA1C in the last 72 hours. Fasting Lipid Panel Recent Labs    11/17/20 2013  TRIG 44   Thyroid Function Tests No results for input(s): TSH, T4TOTAL, T3FREE, THYROIDAB in the last 72 hours.  Invalid input(s): FREET3  Other results:   Imaging    DG Chest 1 View  Result Date: 11/20/2020 CLINICAL DATA:  Follow-up LVAD EXAM: CHEST  1 VIEW COMPARISON:  11/19/2020 FINDINGS: Cardiac shadow is stable. Swan-Ganz catheter and left ventricular assist device are again noted and stable. Mediastinal drain and left thoracostomy catheter are again seen. Previously seen pneumothorax on the left is again noted and stable. Postsurgical changes are again seen. Right lung remains clear. Small left effusion is noted. IMPRESSION: Stable small left pneumothorax. Tubes and lines as described above. No new focal abnormality is noted. Electronically Signed   By: Alcide Clever M.D.   On: 11/20/2020 08:18     Medications:     Scheduled Medications: . acetaminophen  650 mg Oral Q6H  . aspirin EC  325 mg Oral Daily   Or  . aspirin  324 mg Per Tube Daily   Or  . aspirin  300 mg Rectal Daily  . bisacodyl  10 mg Oral Daily   Or  . bisacodyl  10 mg Rectal Daily  . chlorhexidine  15 mL Mouth Rinse BID  . Chlorhexidine Gluconate Cloth  6 each Topical Daily  . colchicine  0.6 mg Oral BID  . docusate sodium  200 mg Oral Daily  . insulin aspart  2-6 Units Subcutaneous Q4H  . insulin detemir  10 Units Subcutaneous Q12H  . ipratropium-albuterol  3 mL Nebulization BID  . lidocaine  1 patch Transdermal Q24H  . mouth rinse  15 mL Mouth Rinse q12n4p  . metolazone  2.5 mg Oral Once  . mometasone-formoterol  2 puff Inhalation BID  . multivitamin with minerals  1 tablet Oral Daily  . pantoprazole  40 mg Oral QHS  .  potassium chloride  40 mEq Oral Once  . potassium chloride  40 mEq Oral Once  . QUEtiapine  25 mg Oral QHS  . rosuvastatin  40 mg Oral q1800  . sodium chloride flush  10-40 mL Intracatheter Q12H  . sodium chloride flush  10-40 mL Intracatheter Q12H  . sodium chloride flush  3 mL Intravenous Q12H  . Warfarin - Pharmacist Dosing Inpatient   Does not apply q1600    Infusions: . sodium chloride Stopped (11/20/20 0600)  . sodium chloride    . sodium chloride 10 mL/hr at 11/20/20 0600  . amiodarone 30 mg/hr (11/20/20 0600)  . epinephrine Stopped (11/19/20 1708)  . furosemide (LASIX) 200 mg in dextrose 5% 100 mL (2mg /mL) infusion 8 mg/hr (11/20/20 0600)  . heparin 500 Units/hr (11/20/20 0852)  . insulin Stopped (11/18/20 1628)  . lactated ringers 10 mL/hr at 11/17/20 0700  . lactated ringers    . lactated ringers 20 mL/hr at 11/20/20 0600  . milrinone    . nitroGLYCERIN 0 mcg/min (11/17/20 1445)  . norepinephrine (LEVOPHED) Adult infusion Stopped (11/20/20 0117)  . potassium chloride 10 mEq (11/20/20 0812)    PRN Medications: sodium chloride, acetaminophen, albuterol, aminocaproic acid, dextrose, haloperidol lactate, morphine injection, morphine injection, ondansetron (ZOFRAN) IV, oxyCODONE, sodium chloride flush, sodium chloride flush, sodium chloride flush, traMADol    Patient Profile   Kevin Underwood a 59 y.o.malewith a history of systolic HF, 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 type 2 diabetes mellitus, COPD, and hypertension. Now s/p Heartmate 3 LVAD.   Assessment/Plan   1. Acute on chronic systolic CHF with cardiogenic shock: Echo 10/21 with EF 20-25%, mildly decreased RV function (persistently low). LHC/RHC this admission with patent grafts, low output. Suspect mixed ischemic/nonischemic cardiomyopathy (prior heavy ETOH and drugs  as well as CAD).  No ETOH, drugs, smoking for about 1 year since CABG in 12/20. NYHA class IV at admission.  Had placement of Impella 5.5 initially, now s/p Heartmate 3 LVAD on 12/10.  This morning, remains on milrinone 0.25.  CI 2.8 by 14/10 with co-ox 59%, CVP 16 on Lasix gtt 8 mg/hr.   - LVAD stable, will need ramp echo when off milrinone.  - Decrease milrinone to 0.125 today.   - Increase Lasix gtt to 12 mg/hr and will give a dose of metolazone 2.5 x 1.  Replace K aggressive.    - Continue ASA 325 mg daily.  Have started warfarin and will begin low dose heparin gtt today.    2. Acute hypoxemic respiratory failure: Now extubated and stable.  3. CAD: S/p CABG 12/20.  LHC this admission with patent grafts, no target for intervention. - Continue ASA and statin.  4. Atrial fibrillation: Paroxysmal.  S/p Maze and LA appendage clip with CABG in 12/20.  Appears to be in NSR with PACs.  - Continue amiodarone gtt post-op.  - Anticoagulation started as above.  5. Type 2 diabetes: A1c 10.4, poor control.  Continue insulin.  6. Prior ETOH, drugs: None x 1 year.  7. Anemia: Post-op, transfuse hgb < 8.    8. COPD: PFTs showed FEV1 1.79 (limited study due to LVAD).   CRITICAL CARE Performed by: 1/21  Total critical care time: 40 minutes  Critical care time was exclusive of separately billable procedures and treating other patients.  Critical care was necessary to treat or prevent imminent or life-threatening deterioration.  Critical care was time spent personally by me on the following activities: development of treatment  plan with patient and/or surrogate as well as nursing, discussions with consultants, evaluation of patient's response to treatment, examination of patient, obtaining history from patient or surrogate, ordering and performing treatments and interventions, ordering and review of laboratory studies, ordering and review of radiographic studies, pulse oximetry and re-evaluation of  patient's condition.    Length of Stay: 46  Marca Ancona, MD  11/20/2020, 9:00 AM  Advanced Heart Failure Team Pager [phone] (M-F; 7a - 4p)  Please contact [facility] for night-coverage after hours (4p -7a ) and weekends on [email or web address]

## 2020-11-20 NOTE — Progress Notes (Signed)
Patient ID: Kevin Underwood, male   DOB: [birth year], 59 y.o.   MRN: [id number] TCTS Evening Rounds:  Hemodynamics stable on VAD, milrinone 0.125.  He is in good spirits. Walked today. Says he feels better.  Good urine output on lasix drip.  Will check K+ level tonight and replace as needed.  Chest tube output low.

## 2020-11-20 NOTE — Progress Notes (Signed)
LVAD Coordinator Rounding Note:  Admitted 11/08/20 due to cardiogenic shock.   HM 3 LVAD implanted on 11/17/20 by ZA under DT criteria.  POD #3.  Now on amiodarone 30 mg/hr and milrinone 0.25.  I/Os negative with Lasix gtt.  MAP currently in 80s.  Pain in right shoulder.   Vital signs: Tmax: 98.2 Core HR: 85 NSR Doppler Pressure: 74 Automatic BP: 119/88 (99) O2 Sat: 92 on 2 L/[place] Wt:  199.9 lbs   LVAD interrogation reveals:   Speed: 5500 Flow: 4.1 Power:  4.0 PI: 3.9 Alarms: none Events:  none Hematocrit: 27 Fixed speed: 5500 Low speed limit: 5200   Drive Line: Existing VAD dressing removed and site care performed using sterile technique. Drive line exit site cleaned with Chlora prep applicators x 2, allowed to dry, and gauze dressing with silver strip re-applied. Exit site healing with small scab and not incorporated, the velour is fully implanted at exit site. Slight redness, no tenderness, drainage, foul odor or rash noted. Drive line anchor secure x 2. Wife observed dressing change today. Given gloves to practice donning.       Labs:  LDH trend: 297  INR trend: 1.3  Anticoagulation Plan: -INR Goal: 2-2.5 -ASA Dose: 325 mg d/c date of 12/18/20  Blood Products:  Intra-op 11/17/20: 4 FFP 2 PLT 2 PRBC 2 Cryo 380 cc of cell saver  Post op: 11/19/20>> 1 u/PRBC  Device: -NONE  Respiratory: Extubated 11/18/20  Infection:  Renal:  -BUN/CRT: 11/0.56  Gtts: Milrinone 0.125 mcg/kg/min Lasix 12 mg/hr Amiodarone 30 mg/hr  Adverse Events on VAD:  Patient Education: 1. Wife observed dressing change today. 2. Pt and caregiver taught how to change from PM to batteries. Pt/caregiver successfully demonstrated switching power sources.   Plan/Recommendations:   1. Daily dressing changes by VAD Coordinator or nurse champion. Next dressing change due tomorrow 11/21/20. 2. Call VAD coordinator for any equipment issues.  Carlton Adam RN, BSN VAD  Coordinator 24/7 Pager [phone]

## 2020-11-20 NOTE — Progress Notes (Signed)
NAME:  Kevin Underwood, MRN:  [id number], DOB:  [birth year], LOS: 12 ADMISSION DATE:  11/08/2020, CONSULTATION DATE:  11/17/2020 REFERRING MD: Vickey Sages, CHIEF COMPLAINT: Status post VAD implantation  HPI/course in hospital  59 year old man with history of ischemic cardiomyopathy presented 12/1 with shortness of breath and chest tightness.  Increasing weight, increasing abdominal girth and poor appetite.  Known ischemic cardiomyopathy with prior three-vessel bypass 1 year ago requiring perioperative Impella support.  Right heart catheterization showing a low SCV O2 and cardiac index.  Impella 5.5 inserted for preoperative optimization, diuresed.  Hemodynamics improved  Underwent implantation HeartMate 3 LVAD.  Received 4 L of fluid intraoperatively.  Came off bypass uneventfully  Past Medical History   Past Medical History:  Diagnosis Date  . Arthritis   . CAD (coronary artery disease)    a. s/p CABG in 11/2019 with LIMA-LAD, SVG-OM1, SVG-PDA and SVG-D1  . CHF (congestive heart failure) (HCC)    a. EF < 20% by echo in 11/2019  . COPD (chronic obstructive pulmonary disease) (HCC)   . Essential hypertension   . PAF (paroxysmal atrial fibrillation) (HCC)   . Type 2 diabetes mellitus (HCC)      Past Surgical History:  Procedure Laterality Date  . BACK SURGERY    . CLIPPING OF ATRIAL APPENDAGE N/A 11/26/2019   Procedure: Clipping Of Atrial Appendage using AtriCure 40 Clip;  Surgeon: [name], MD;  Location: [facility] OR;  Service: Open Heart Surgery;  Laterality: N/A;  . CORONARY ARTERY BYPASS GRAFT N/A 11/26/2019   Procedure: CORONARY ARTERY BYPASS GRAFTING (CABG) using endoscopic greater saphenous vein harvest: svc to OM; svc to Diag; svc to PD; and LIMA to LAD.;  Surgeon: [name], MD;  Location: [facility] OR;  Service: Open Heart Surgery;  Laterality: N/A;  . FOOT SURGERY    . HAND SURGERY    . INTRAOPERATIVE TRANSESOPHAGEAL ECHOCARDIOGRAM  12/04/2019   Procedure: Intraoperative  Transesophageal Echocardiogram;  Surgeon: [name], MD;  Location: [facility] OR;  Service: Open Heart Surgery;;  . MAZE N/A 11/26/2019   Procedure: MAZE using Bilateral Pulmonary Vein isolation.;  Surgeon: [name], MD;  Location: [facility] OR;  Service: Open Heart Surgery;  Laterality: N/A;  . MULTIPLE EXTRACTIONS WITH ALVEOLOPLASTY N/A 11/16/2020   Procedure: MULTIPLE EXTRACTION WITH ALVEOLOPLASTY;  Surgeon: Sharman Cheek, DMD;  Location: [facility] OR;  Service: Dentistry;  Laterality: N/A;  . PLACEMENT OF IMPELLA LEFT VENTRICULAR ASSIST DEVICE N/A 11/24/2019   Procedure: PLACEMENT OF IMPELLA 5.5 LEFT VENTRICULAR ASSIST DEVICE;  Surgeon: [name], MD;  Location: [facility] OR;  Service: Open Heart Surgery;  Laterality: N/A;  . PLACEMENT OF IMPELLA LEFT VENTRICULAR ASSIST DEVICE Left 11/10/2020   Procedure: PLACEMENT OF IMPELLA 5.5 LEFT VENTRICULAR ASSIST DEVICE VIA  LEFT AVILLARY ARTERY;  Surgeon: [name], MD;  Location: [facility] OR;  Service: Open Heart Surgery;  Laterality: Left;  . REMOVAL OF IMPELLA LEFT VENTRICULAR ASSIST DEVICE Right 12/04/2019   Procedure: REMOVAL OF IMPELLA LEFT VENTRICULAR ASSIST DEVICE, right axilla;  Surgeon: [name], MD;  Location: [facility] OR;  Service: Open Heart Surgery;  Laterality: Right;  . RIGHT/LEFT HEART CATH AND CORONARY ANGIOGRAPHY N/A 11/24/2019   Procedure: RIGHT/LEFT HEART CATH AND CORONARY ANGIOGRAPHY;  Surgeon: Laurey Morale, MD;  Location: [facility] INVASIVE CV LAB;  Service: Cardiovascular;  Laterality: N/A;  . RIGHT/LEFT HEART CATH AND CORONARY/GRAFT ANGIOGRAPHY N/A 11/09/2020   Procedure: RIGHT/LEFT HEART CATH AND CORONARY/GRAFT ANGIOGRAPHY;  Surgeon: [name], MD;  Location: [facility] INVASIVE CV LAB;  Service: Cardiovascular;  Laterality: N/A;  . TEE WITHOUT CARDIOVERSION N/A 11/24/2019   Procedure: TRANSESOPHAGEAL ECHOCARDIOGRAM (TEE);  Surgeon: [name], MD;  Location: [facility] OR;  Service: Open Heart Surgery;  Laterality: N/A;  . TEE  WITHOUT CARDIOVERSION N/A 11/26/2019   Procedure: TRANSESOPHAGEAL ECHOCARDIOGRAM (TEE);  Surgeon: [name], MD;  Location: [facility] OR;  Service: Open Heart Surgery;  Laterality: N/A;  . TEE WITHOUT CARDIOVERSION N/A 11/10/2020   Procedure: TRANSESOPHAGEAL ECHOCARDIOGRAM (TEE);  Surgeon: [name], MD;  Location: [facility] OR;  Service: Open Heart Surgery;  Laterality: N/A;    Interim history/subjective:  Off epi and norepi. Remains on milrinone, lasix, amio. Has remained off precedex.  Objective   Blood pressure 95/77, pulse 76, temperature (!) 97.16 F (36.2 C), resp. rate 16, height 5\' 10"  (1.778 m), weight 90.7 kg, SpO2 97 %. PAP: (27-47)/(6-30) 31/12 CVP:  [7 mmHg-27 mmHg] 11 mmHg CO:  [4.7 L/min-6.5 L/min] 6.5 L/min CI:  [2.4 L/min/m2-3.3 L/min/m2] 3.3 L/min/m2  FiO2 (%):  [28 %] 28 %   Intake/Output Summary (Last 24 hours) at 11/20/2020 0748 Last data filed at 11/20/2020 0600 Gross per 24 hour  Intake 2529.79 ml  Output 3225 ml  Net -695.21 ml   Filed Weights   11/18/20 0343 11/19/20 0500 11/20/20 0500  Weight: 86.2 kg 88.7 kg 90.7 kg   PAP: (27-47)/(6-30) 31/12 CVP:  [7 mmHg-27 mmHg] 11 mmHg CO:  [4.7 L/min-6.5 L/min] 6.5 L/min CI:  [2.4 L/min/m2-3.3 L/min/m2] 3.3 L/min/m2   Physical Exam General: Adult male, resting in bed, in NAD. Neuro: A&O x 3, no deficits. HEENT: [place]/AT. Sclerae anicteric. EOMI. Cardiovascular: RRR, mechanical LVAD tones noted.  Lungs: Respirations even and unlabored.  CTA bilaterally, No W/R/R.  Abdomen: BS x 4, soft, NT/ND.  Musculoskeletal: No gross deformities, no edema.  Skin: Intact, warm, no rashes.   Assessment & Plan:   Acute hypoxic respiratory insufficiency following LVAD implantation Critically ill due to mixed distributive and cardiogenic shock following cardiopulmonary bypass on background of known ischemic cardiomyopathy Acute decompensated biventricular failure with EF less than 20% Coronary artery disease History  of paroxysmal atrial fibrillation Type 2 diabetes COPD with PFTs indicating severe airway obstruction  Plan:  -Optimize pain control -Incentive spirometry -Continue lasix gtt, amiodarone -Continue LVAD per heart failure and TCTS -On home COPD regimen -Continue SSI   Nothing further to add.  PCCM will sign off.  Please do not hesitate to call 11/22/20 back if we can be of any further assistance.   CC time: 35 min.   [place], Rutherford Guys [facility] 11/20/2020, 7:51 AM

## 2020-11-20 NOTE — Progress Notes (Signed)
3 Days Post-Op Procedure(s) (LRB): REDO STERNOTOMY (N/A) INSERTION OF IMPLANTABLE LEFT VENTRICULAR ASSIST DEVICE - HM3 (N/A) TRANSESOPHAGEAL ECHOCARDIOGRAM (TEE) (N/A) Subjective: Incisional pain  Objective: Vital signs in last 24 hours: Temp:  [96.8 F (36 C)-98.78 F (37.1 C)] 97.16 F (36.2 C) (12/13 0700) Pulse Rate:  [67-83] 76 (12/13 0733) Cardiac Rhythm: Normal sinus rhythm (12/12 2000) Resp:  [12-30] 16 (12/13 0733) BP: (95)/(77) 95/77 (12/13 0600) SpO2:  [90 %-100 %] 97 % (12/13 0733) Arterial Line BP: (69-122)/(60-100) 109/89 (12/13 0407) FiO2 (%):  [28 %] 28 % (12/13 0733) Weight:  [90.7 kg] 90.7 kg (12/13 0500)  Hemodynamic parameters for last 24 hours: PAP: (27-47)/(8-30) 31/12 CVP:  [7 mmHg-27 mmHg] 11 mmHg CO:  [4.7 L/min-6.5 L/min] 6.5 L/min CI:  [2.4 L/min/m2-3.3 L/min/m2] 3.3 L/min/m2  Intake/Output from previous day: 12/12 0701 - 12/13 0700 In: 2529.8 [P.O.:240; I.V.:1974.8; Blood:315] Out: 7494 [WHQPR:9163; Drains:20; Chest Tube:750] Intake/Output this shift: No intake/output data recorded.  General appearance: alert and cooperative Neurologic: intact Heart: regular rate and rhythm, S1, S2 normal, no murmur, click, rub or gallop Lungs: clear to auscultation bilaterally Abdomen: mild distension Extremities: edema mild Wound: c/d/i  Lab Results: Recent Labs    11/19/20 0436 11/19/20 0611 11/20/20 0359 11/20/20 0438  WBC 11.4*  --  8.2  --   HGB 8.2*   < > 9.0* 9.2*  HCT 24.5*   < > 26.7* 27.0*  PLT 144*  --  132*  --    < > = values in this interval not displayed.   BMET:  Recent Labs    11/19/20 0436 11/19/20 0611 11/20/20 0359 11/20/20 0438  NA 131*   < > 131* 133*  K 3.7   < > 3.3* 3.4*  CL 97*  --  96*  --   CO2 26  --  27  --   GLUCOSE 77  --  104*  --   BUN 13  --  11  --   CREATININE 0.69  --  0.56*  --   CALCIUM 8.3*  --  8.0*  --    < > = values in this interval not displayed.    PT/INR:  Recent Labs     11/20/20 0359  LABPROT 15.9*  INR 1.3*   ABG    Component Value Date/Time   PHART 7.508 (H) 11/20/2020 0438   HCO3 27.9 11/20/2020 0438   TCO2 29 11/20/2020 0438   ACIDBASEDEF 6.0 (H) 11/24/2019 1718   O2SAT 96.0 11/20/2020 0438   CBG (last 3)  Recent Labs    11/19/20 2356 11/20/20 0344 11/20/20 0726  GLUCAP 142* 71 80    Assessment/Plan: S/P Procedure(s) (LRB): REDO STERNOTOMY (N/A) INSERTION OF IMPLANTABLE LEFT VENTRICULAR ASSIST DEVICE - HM3 (N/A) TRANSESOPHAGEAL ECHOCARDIOGRAM (TEE) (N/A) Mobilize Diuresis Diabetes control  Pain control Leave chest tubes Start low dose heparin   LOS: 12 days    Kevin Underwood 11/20/2020

## 2020-11-20 NOTE — Op Note (Signed)
CARDIOTHORACIC SURGERY OPERATIVE NOTE  Date of Procedure: 11/17/20 Preoperative Diagnosis: Ischemic Cardiomyopathy; end-stage heart failure  Postoperative Diagnosis: Same  Procedure:    Redo sternotomy  Implantation of HM 3 LVAD  Right axillary artery cannulation  Surgeon: B. Lorayne Marek, MD  Assistant: Kerin Perna, MD  Anesthesia: General  Operative Findings:  Severely reduced left ventricular systolic function; mildly reduced right ventricular function  No significant valvular dysfunction    BRIEF CLINICAL NOTE AND INDICATIONS FOR SURGERY  59 year old gentleman underwent CABG 1 year ago.  This is in the setting of severely reduced and acute heart failure.  Unfortunately, his LV function has not significantly improved.  He was readmitted with heart failure symptoms and is considered Intermacs 1 at the time of initial assessment.  He underwent left axillary artery Impella implantation and is stabilized.  He is now taken the operating room for redo sternotomy and HeartMate 3 implantation   DETAILS OF THE OPERATIVE PROCEDURE  Preparation:  The patient is brought to the operating room on the above mentioned date and central monitoring was established by the anesthesia team including placement of Swan-Ganz catheter and radial arterial line. The patient is placed in the supine position on the operating table.  Intravenous antibiotics are administered. General endotracheal anesthesia is induced uneventfully. A Foley catheter is placed.  The patient's chest, abdomen, both groins, and both lower extremities are prepared and draped in a sterile manner. A time out procedure is performed.   Surgical Approach and Conduit Harvest: Attention is first turned to the right infraclavicular fossa where an incision was made overlying the deltopectoral groove.  Dissection carried out the subtenons tissue and the pectoralis major minor muscles are divided.  The patient had previously  undergone right axillary artery cannulation with a chimney graft and this was exposed and dissected out.  10,000 units of heparin were given intravenously.  The right axillary artery was encircled.  A 10 mm Hemashield gold graft was sewn end-to-side to the stump of the old graft with a running suture of 5-0 Prolene.  This was then de-aired and attached to arterial limb of the cardioplegic bypass instrument.  Redo median sternotomy  was undertaken and the chest was entered safely without injury to mediastinal structures.  A pump pocket was created towards the LV apex by dividing muscular diaphragmatic attachments laterally. Driveline tunnelers were passed from inside the mediastinum inferiorly and then to the left prior to heparinization.  Intra-pericardial dissection was performed.  Full dose heparin was delivered venously.  Tacking sutures were placed to create a pericardial well. CO2 was blown into the field. . The ascending aorta is normal in appearance. The right atrium was cannulated for cardiopulmonary bypass.  Adequate heparinization is verified. Cardiopulmonary bypass was begun and the patient is kept warm; the lungs are continued to inflate at very low tidal volumes to introduce inhaled nitric oxide systemically.    LVAD Insertion: Moist packs were placed behind the LV to elevate the LV apex into the wound. The LV apical core is created sharply and angled directly towards the mitral apparatus. The edges of the incised LV are inspected for hemostasis. Circumferential sutures of 2-0 Ethibond with pledgets are placed from the epicardium, into the LV, and back into the epicardium. These are then placed through the sewing cuff of the LVAD inflow housing. The sutures are tied down, and the pump is brought onto the field and inspected. The inflow cannula is secured within the housing and the outflow graft is  clamped. The driveline is tunneled exteriorly using the pre-placed tunnelers.  The outflow graft is  then measured to appropriate length. A partial occlusion clamp is placed on the ascending aorta and aortotomy is made anteriorly. The outflow graft is anastomosed to the ascending aorta with running 4-0 prolene. The side-biting clamp is released and the pump is turned on at 4,000 rpm to deair the LVAD. Full pulmonary ventilation is resumed. The patient is weaned from CPB as the LVAD flows are increased to 5400 rpm gradually.    Procedure Completion:   Epicardial pacing wires are fixed to the right ventricular outflow tract. The patient is rewarmed to 37C temperature. The patient is weaned and disconnected from cardiopulmonary bypass.  The patient's rhythm at separation from bypass was NSR.  The patient was weaned from cardiopulmonary bypass  With moderate inotropic support.  Followup transesophageal echocardiogram performed after separation from bypass revealed no significant changes from the preoperative exam and good pump inflow positioning.   The axillary arterial and venous cannula were removed uneventfully. Protamine was administered to reverse the anticoagulation. The mediastinum and pleural space were inspected for hemostasis and irrigated with saline solution. The mediastinum and bilateral pleural space were drained using fluted chest tubes placed through separate stab incisions inferiorly.  The soft tissues anterior to the aorta were reapproximated loosely. The sternum is closed with double strength sternal wire. The soft tissues anterior to the sternum were closed in multiple layers and the skin is closed with a running subcuticular skin closure.       Disposition:   The patient tolerated the procedure well and is transported to the surgical intensive care in stable condition. There are no intraoperative complications. All sponge instrument and needle counts are verified correct at completion of the operation.

## 2020-11-21 ENCOUNTER — Inpatient Hospital Stay (HOSPITAL_COMMUNITY): Payer: 59

## 2020-11-21 LAB — CBC WITH DIFFERENTIAL/PLATELET
Abs Immature Granulocytes: 0.04 10*3/uL (ref 0.00–0.07)
Basophils Absolute: 0 10*3/uL (ref 0.0–0.1)
Basophils Relative: 0 %
Eosinophils Absolute: 0.1 10*3/uL (ref 0.0–0.5)
Eosinophils Relative: 1 %
HCT: 31 % — ABNORMAL LOW (ref 39.0–52.0)
Hemoglobin: 9.9 g/dL — ABNORMAL LOW (ref 13.0–17.0)
Immature Granulocytes: 0 %
Lymphocytes Relative: 13 %
Lymphs Abs: 1.2 10*3/uL (ref 0.7–4.0)
MCH: 28.3 pg (ref 26.0–34.0)
MCHC: 31.9 g/dL (ref 30.0–36.0)
MCV: 88.6 fL (ref 80.0–100.0)
Monocytes Absolute: 1.1 10*3/uL — ABNORMAL HIGH (ref 0.1–1.0)
Monocytes Relative: 12 %
Neutro Abs: 6.6 10*3/uL (ref 1.7–7.7)
Neutrophils Relative %: 74 %
Platelets: 181 10*3/uL (ref 150–400)
RBC: 3.5 MIL/uL — ABNORMAL LOW (ref 4.22–5.81)
RDW: 15.3 % (ref 11.5–15.5)
WBC: 9 10*3/uL (ref 4.0–10.5)
nRBC: 0 % (ref 0.0–0.2)

## 2020-11-21 LAB — TYPE AND SCREEN
ABO/RH(D): B POS
Antibody Screen: NEGATIVE
Unit division: 0
Unit division: 0
Unit division: 0
Unit division: 0
Unit division: 0
Unit division: 0
Unit division: 0
Unit division: 0

## 2020-11-21 LAB — COOXEMETRY PANEL
Carboxyhemoglobin: 1.7 % — ABNORMAL HIGH (ref 0.5–1.5)
Carboxyhemoglobin: 1.9 % — ABNORMAL HIGH (ref 0.5–1.5)
Methemoglobin: 0.9 % (ref 0.0–1.5)
Methemoglobin: 0.9 % (ref 0.0–1.5)
O2 Saturation: 52.3 %
O2 Saturation: 55.7 %
Total hemoglobin: 10.7 g/dL — ABNORMAL LOW (ref 12.0–16.0)
Total hemoglobin: 10.9 g/dL — ABNORMAL LOW (ref 12.0–16.0)

## 2020-11-21 LAB — BPAM RBC
Blood Product Expiration Date: 202201042359
Blood Product Expiration Date: 202201042359
Blood Product Expiration Date: 202201102359
Blood Product Expiration Date: 202201112359
Blood Product Expiration Date: 202201112359
Blood Product Expiration Date: 202201112359
Blood Product Expiration Date: 202201112359
Blood Product Expiration Date: 202201122359
ISSUE DATE / TIME: 202112100720
ISSUE DATE / TIME: 202112100720
ISSUE DATE / TIME: 202112101731
ISSUE DATE / TIME: 202112122321
Unit Type and Rh: 7300
Unit Type and Rh: 7300
Unit Type and Rh: 7300
Unit Type and Rh: 7300
Unit Type and Rh: 7300
Unit Type and Rh: 7300
Unit Type and Rh: 7300
Unit Type and Rh: 7300

## 2020-11-21 LAB — BASIC METABOLIC PANEL
Anion gap: 11 (ref 5–15)
BUN: 9 mg/dL (ref 6–20)
CO2: 29 mmol/L (ref 22–32)
Calcium: 8.1 mg/dL — ABNORMAL LOW (ref 8.9–10.3)
Chloride: 93 mmol/L — ABNORMAL LOW (ref 98–111)
Creatinine, Ser: 0.68 mg/dL (ref 0.61–1.24)
GFR, Estimated: >60 mL/min (ref >60)
Glucose, Bld: 129 mg/dL — ABNORMAL HIGH (ref 70–99)
Potassium: 3.3 mmol/L — ABNORMAL LOW (ref 3.5–5.1)
Sodium: 133 mmol/L — ABNORMAL LOW (ref 135–145)

## 2020-11-21 LAB — PROTIME-INR
INR: 1.4 — ABNORMAL HIGH (ref 0.8–1.2)
Prothrombin Time: 16.6 seconds — ABNORMAL HIGH (ref 11.4–15.2)

## 2020-11-21 LAB — GLUCOSE, CAPILLARY
Glucose-Capillary: 120 mg/dL — ABNORMAL HIGH (ref 70–99)
Glucose-Capillary: 65 mg/dL — ABNORMAL LOW (ref 70–99)
Glucose-Capillary: 80 mg/dL (ref 70–99)
Glucose-Capillary: 180 mg/dL — ABNORMAL HIGH (ref 70–99)
Glucose-Capillary: 169 mg/dL — ABNORMAL HIGH (ref 70–99)
Glucose-Capillary: 187 mg/dL — ABNORMAL HIGH (ref 70–99)
Glucose-Capillary: 180 mg/dL — ABNORMAL HIGH (ref 70–99)

## 2020-11-21 LAB — MAGNESIUM: Magnesium: 1.9 mg/dL (ref 1.7–2.4)

## 2020-11-21 LAB — LACTATE DEHYDROGENASE: LDH: 340 U/L — ABNORMAL HIGH (ref 98–192)

## 2020-11-21 LAB — PHOSPHORUS: Phosphorus: 2.6 mg/dL (ref 2.5–4.6)

## 2020-11-21 MED ORDER — LOSARTAN POTASSIUM 25 MG PO TABS
25.0000 mg | ORAL_TABLET | Freq: Every day | ORAL | Status: DC
  Administered 2020-11-21: 11:00:00 25 mg via ORAL
  Filled 2020-11-21 (×2): qty 1

## 2020-11-21 MED ORDER — POTASSIUM CHLORIDE CRYS ER 20 MEQ PO TBCR
20.0000 meq | EXTENDED_RELEASE_TABLET | ORAL | Status: AC
  Administered 2020-11-21 (×3): 20 meq via ORAL
  Filled 2020-11-21 (×3): qty 1

## 2020-11-21 MED ORDER — WARFARIN SODIUM 2.5 MG PO TABS
2.5000 mg | ORAL_TABLET | Freq: Once | ORAL | Status: AC
  Administered 2020-11-21: 16:00:00 2.5 mg via ORAL
  Filled 2020-11-21: qty 1

## 2020-11-21 MED ORDER — MAGNESIUM SULFATE 2 GM/50ML IV SOLN
2.0000 g | Freq: Once | INTRAVENOUS | Status: AC
  Administered 2020-11-21: 09:00:00 2 g via INTRAVENOUS
  Filled 2020-11-21: qty 50

## 2020-11-21 NOTE — Plan of Care (Signed)
  Problem: Education: Goal: Knowledge of General Education information will improve Description: Including pain rating scale, medication(s)/side effects and non-pharmacologic comfort measures Outcome: Progressing   Problem: Health Behavior/Discharge Planning: Goal: Ability to manage health-related needs will improve Outcome: Progressing   Problem: Clinical Measurements: Goal: Ability to maintain clinical measurements within normal limits will improve Outcome: Progressing Goal: Will remain free from infection Outcome: Progressing Goal: Diagnostic test results will improve Outcome: Progressing Goal: Respiratory complications will improve Outcome: Progressing Goal: Cardiovascular complication will be avoided Outcome: Progressing   Problem: Activity: Goal: Risk for activity intolerance will decrease Outcome: Progressing   Problem: Nutrition: Goal: Adequate nutrition will be maintained Outcome: Progressing   Problem: Coping: Goal: Level of anxiety will decrease Outcome: Progressing   Problem: Elimination: Goal: Will not experience complications related to bowel motility Outcome: Progressing Goal: Will not experience complications related to urinary retention Outcome: Progressing   Problem: Pain Managment: Goal: General experience of comfort will improve Outcome: Progressing   Problem: Safety: Goal: Ability to remain free from injury will improve Outcome: Progressing   Problem: Skin Integrity: Goal: Risk for impaired skin integrity will decrease Outcome: Progressing   Problem: Education: Goal: Understanding of cardiac disease, CV risk reduction, and recovery process will improve Outcome: Progressing Goal: Individualized Educational Video(s) Outcome: Progressing   Problem: Activity: Goal: Ability to tolerate increased activity will improve Outcome: Progressing   Problem: Cardiac: Goal: Ability to achieve and maintain adequate cardiovascular perfusion will  improve Outcome: Progressing   Problem: Health Behavior/Discharge Planning: Goal: Ability to safely manage health-related needs after discharge will improve Outcome: Progressing   Problem: Education: Goal: Understanding of CV disease, CV risk reduction, and recovery process will improve Outcome: Progressing Goal: Individualized Educational Video(s) Outcome: Progressing   Problem: Activity: Goal: Ability to return to baseline activity level will improve Outcome: Progressing   Problem: Cardiovascular: Goal: Ability to achieve and maintain adequate cardiovascular perfusion will improve Outcome: Progressing Goal: Vascular access site(s) Level 0-1 will be maintained Outcome: Progressing   Problem: Health Behavior/Discharge Planning: Goal: Ability to safely manage health-related needs after discharge will improve Outcome: Progressing   Problem: Education: Goal: Knowledge of the prescribed therapeutic regimen will improve Outcome: Progressing   Problem: Activity: Goal: Risk for activity intolerance will decrease Outcome: Progressing   Problem: Cardiac: Goal: Ability to maintain an adequate cardiac output will improve Outcome: Progressing   Problem: Coping: Goal: Level of anxiety will decrease Outcome: Progressing   Problem: Fluid Volume: Goal: Risk for excess fluid volume will decrease Outcome: Progressing   Problem: Clinical Measurements: Goal: Ability to maintain clinical measurements within normal limits will improve Outcome: Progressing Goal: Will remain free from infection Outcome: Progressing   Problem: Respiratory: Goal: Will regain and/or maintain adequate ventilation Outcome: Progressing   

## 2020-11-21 NOTE — Progress Notes (Signed)
Occupational Therapy Treatment Patient Details Name: Kevin Underwood MRN: [id number] DOB: 1961-02-07 Today's Date: 11/21/2020    History of present illness Kevin Underwood is a 59 y.o. 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 Pt adm 12/01 with Acute on chronic systolic CHF with cardiogenic shock. Had impella placed 12/3. Underwent LVAD on 11/17/20.   OT comments  Pt progressing towards established OT goals and demonstrating increased activity tolerance. Educating pt on figure four method for donning socks at bed level; pt demonstrating understanding. Providing education to wife on management of LVAD and she demonstrated understanding for switching on/off batteries with Min cues. Pt performing functional mobility in hallway with Min A +2 and eva walker. VSS throughout on 3L O2. Continue to recommend dc to home with HHTO and will continue to follow acutely as admitted.     Follow Up Recommendations  Home health OT;Supervision/Assistance - 24 hour    Equipment Recommendations  Tub/shower seat;3 in 1 bedside commode    Recommendations for Other Services PT consult    Precautions / Restrictions Precautions Precautions: Fall;Sternal;Other (comment) (LVAD) Precaution Comments: Began education re: driveline safety and securing system controller. Pt has LVAD, Kevin Underwood, chest tube and foley catheter and Oxygen Restrictions Other Position/Activity Restrictions: Sternal precautions       Mobility Bed Mobility Overal bed mobility: Needs Assistance Bed Mobility: Supine to Sit     Supine to sit: Mod assist;+2 for safety/equipment;HOB elevated     General bed mobility comments: Mod A for elevating trunk and maintaining sternal precautions  Transfers Overall transfer level: Needs assistance Equipment used: None (Eva  walker) Transfers: Sit to/from Stand Sit to Stand: Min assist;+2 safety/equipment         General transfer comment: Min A +2 for safety and balance into standing    Balance Overall balance assessment: Needs assistance Sitting-balance support: No upper extremity supported;Feet supported Sitting balance-Leahy Scale: Fair     Standing balance support: Bilateral upper extremity supported;During functional activity Standing balance-Leahy Scale: Poor Standing balance comment: UE support of Eva walker                           ADL either performed or assessed with clinical judgement   ADL Overall ADL's : Needs assistance/impaired                     Lower Body Dressing: Min guard;Bed level Lower Body Dressing Details (indicate cue type and reason): Having pt use figure four method to don socks while supine in bed with HOB slightly elevated. Toilet Transfer: Minimal assistance;+2 for safety/equipment;Ambulation (simulated to recliner with eva walker) Toilet Transfer Details (indicate cue type and reason): Min A for power up and to gain balance         Functional mobility during ADLs: Minimal assistance;+2 for safety/equipment (eva walker) General ADL Comments: Pt demonstrating increased activity tolerance as seen by performing further mobility in hallway. Also providing education to wife on managing of LVAD batteries. Pt's wife switching pt on/off batteries with Min cues. Pt's wife quick to learn and very supportive     Vision       Perception     Praxis      Cognition Arousal/Alertness: Awake/alert Behavior During Therapy: WFL for tasks assessed/performed Overall Cognitive Status: Within  Functional Limits for tasks assessed                                 General Comments: Very agreeable to therapy        Exercises     Shoulder Instructions       General Comments VSS on 3L O2.    Pertinent Vitals/ Pain       Pain Assessment:  Faces Faces Pain Scale: Hurts little more Pain Location: abdomen Pain Descriptors / Indicators: Grimacing Pain Intervention(s): Monitored during session;Limited activity within patient's tolerance;Repositioned  Home Living                                          Prior Functioning/Environment              Frequency  Min 2X/week        Progress Toward Goals  OT Goals(current goals can now be found in the care plan section)  Progress towards OT goals: Progressing toward goals  Acute Rehab OT Goals Patient Stated Goal: to get stronger OT Goal Formulation: With patient Time For Goal Achievement: 12/03/20 Potential to Achieve Goals: Good ADL Goals Pt Will Perform Grooming: with min guard assist;standing Pt Will Perform Upper Body Bathing: with set-up;with supervision;sitting Pt Will Perform Lower Body Bathing: with set-up;with supervision;sit to/from stand Pt Will Perform Upper Body Dressing: with set-up;with supervision;sitting Pt Will Perform Lower Body Dressing: with min guard assist;sit to/from stand;with adaptive equipment Pt Will Transfer to Toilet: with min guard assist;ambulating;regular height toilet;bedside commode Pt Will Perform Toileting - Clothing Manipulation and hygiene: with min guard assist;sit to/from stand Additional ADL Goal #1: Pt will be independent with precautions with ADLs Additional ADL Goal #2: Pt will be able to manage LVAD equipment during ADLs with supervision  Plan Discharge plan remains appropriate    Co-evaluation    PT/OT/SLP Co-Evaluation/Treatment: Yes Reason for Co-Treatment: Complexity of the patient's impairments (multi-system involvement);For patient/therapist safety;To address functional/ADL transfers   OT goals addressed during session: ADL's and self-care      AM-PAC OT "6 Clicks" Daily Activity     Outcome Measure   Help from another person eating meals?: None Help from another person taking care of  personal grooming?: A Little Help from another person toileting, which includes using toliet, bedpan, or urinal?: A Lot Help from another person bathing (including washing, rinsing, drying)?: A Lot Help from another person to put on and taking off regular upper body clothing?: A Lot Help from another person to put on and taking off regular lower body clothing?: Total 6 Click Score: 14    End of Session Equipment Utilized During Treatment: Oxygen [name] walker)  OT Visit Diagnosis: Unsteadiness on feet (R26.81);Pain   Activity Tolerance Patient tolerated treatment well   Patient Left in chair;with call bell/phone within reach;with family/visitor present;with nursing/sitter in room   Nurse Communication Mobility status;Precautions        Time: 0240-9735 OT Time Calculation (min): 42 min  Charges: OT General Charges $OT Visit: 1 Visit OT Treatments $Self Care/Home Management : 23-37 mins  Levonne Carreras MSOT, OTR/L Acute Rehab Pager: [phone] Office: [phone]   Theodoro Grist Karsten Vaughn 11/21/2020, 10:58 AM

## 2020-11-21 NOTE — Progress Notes (Signed)
Physical Therapy Treatment Patient Details Name: Kevin Underwood MRN: [id number] DOB: 08-17-1961 Today's Date: 11/21/2020    History of Present Illness Kevin Underwood is a 59 y.o. 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 Pt adm 12/01 with Acute on chronic systolic CHF with cardiogenic shock. Had impella placed 12/3. Underwent LVAD on 11/17/20.    PT Comments    Pt admitted with above diagnosis. Pt was able to ambulate on unit and incr distance today with +2 min assist and chair follow for safety although pt did not need to sit and rest. Pt VSS on 3LO2.  Progressing well.  Wife and pt need reinforcement of LVAD equipment but will have practice continue.   Pt currently with functional limitations due to balance and endurance deficits. Pt will benefit from skilled PT to increase their independence and safety with mobility to allow discharge to the venue listed below.     Follow Up Recommendations  Home health PT;Supervision/Assistance - 24 hour (May progress and not need HHPT)     Equipment Recommendations  Other (comment) (Possible rollator)    Recommendations for Other Services       Precautions / Restrictions Precautions Precautions: Fall;Sternal;Other (comment) (LVAD) Precaution Booklet Issued: Yes (comment) Precaution Comments: continued education re: driveline safety and securing system controller.  wife began practice as well.  Pt has LVAD, Theone Murdoch, chest tube and foley catheter and Oxygen Restrictions Other Position/Activity Restrictions: Sternal precautions    Mobility  Bed Mobility Overal bed mobility: Needs Assistance Bed Mobility: Supine to Sit     Supine to sit: Mod assist;+2 for safety/equipment;HOB elevated     General bed mobility comments: Mod A for elevating trunk and maintaining sternal  precautions  Transfers Overall transfer level: Needs assistance Equipment used: None (Eva walker) Transfers: Sit to/from Stand Sit to Stand: Min assist;+2 safety/equipment;Min guard         General transfer comment: Min A +2 for safety and balance into standing  Ambulation/Gait Ambulation/Gait assistance: Min assist;+2 safety/equipment;Min guard Gait Distance (Feet): 260 Feet Assistive device:  (Eva walker) Gait Pattern/deviations: Step-through pattern;Decreased stride length;Trunk flexed   Gait velocity interpretation: <1.31 ft/sec, indicative of household ambulator General Gait Details: Pt walked on unit with +3, one person for providing guard to min steadying assist, 1 person for IV poles and 1 person for chair follow.  Pt made the full second loop and was fatigued once back to room.  Cues for pursed lip breathing and pt on 3L for ambulation.Pt on 2L at rest and once back in room.  Pts speed 5500 throughout.  PI 3.8 to 4.2 with ambulation and other parameters within normal limits. Pt HR and O2 stable with O2 not always picking up.   Stairs             Wheelchair Mobility    Modified Rankin (Stroke Patients Only)       Balance Overall balance assessment: Needs assistance Sitting-balance support: No upper extremity supported;Feet supported Sitting balance-Leahy Scale: Fair     Standing balance support: Bilateral upper extremity supported;During functional activity Standing balance-Leahy Scale: Poor Standing balance comment: UE support of Eva walker                            Cognition Arousal/Alertness: Awake/alert Behavior During  Therapy: WFL for tasks assessed/performed Overall Cognitive Status: Within Functional Limits for tasks assessed                                 General Comments: Very agreeable to therapy      Exercises General Exercises - Lower Extremity Ankle Circles/Pumps: AROM;Both;10 reps;Seated Long Arc Quad:  AROM;Both;10 reps;Seated    General Comments General comments (skin integrity, edema, etc.): VSS on 3LO2      Pertinent Vitals/Pain Pain Assessment: Faces Faces Pain Scale: Hurts little more Pain Location: abdomen Pain Descriptors / Indicators: Grimacing Pain Intervention(s): Limited activity within patient's tolerance;Monitored during session;Repositioned    Home Living                      Prior Function            PT Goals (current goals can now be found in the care plan section) Acute Rehab PT Goals Patient Stated Goal: to get stronger Progress towards PT goals: Progressing toward goals    Frequency    Min 3X/week      PT Plan Current plan remains appropriate    Co-evaluation PT/OT/SLP Co-Evaluation/Treatment: Yes Reason for Co-Treatment: Complexity of the patient's impairments (multi-system involvement);For patient/therapist safety PT goals addressed during session: Mobility/safety with mobility OT goals addressed during session: ADL's and self-care      AM-PAC PT "6 Clicks" Mobility   Outcome Measure  Help needed turning from your back to your side while in a flat bed without using bedrails?: A Little Help needed moving from lying on your back to sitting on the side of a flat bed without using bedrails?: A Little Help needed moving to and from a bed to a chair (including a wheelchair)?: A Little Help needed standing up from a chair using your arms (e.g., wheelchair or bedside chair)?: A Little Help needed to walk in hospital room?: A Little Help needed climbing 3-5 steps with a railing? : A Lot 6 Click Score: 17    End of Session Equipment Utilized During Treatment: Gait belt;Oxygen Activity Tolerance: Patient tolerated treatment well Patient left: with call bell/phone within reach;in chair;with chair alarm set;with family/visitor present Nurse Communication: Mobility status PT Visit Diagnosis: Muscle weakness (generalized) (M62.81);Other  abnormalities of gait and mobility (R26.89)     Time: 5366-4403 PT Time Calculation (min) (ACUTE ONLY): 41 min  Charges:  $Gait Training: 8-22 mins                     Kevin Underwood,PT Acute Rehabilitation Services Pager:  [phone]  Office:  [phone]     Berline Lopes 11/21/2020, 11:45 AM

## 2020-11-21 NOTE — Progress Notes (Signed)
Peripherally Inserted Central Catheter Placement  The IV Nurse has discussed with the patient and/or persons authorized to consent for the patient, the purpose of this procedure and the potential benefits and risks involved with this procedure.  The benefits include less needle sticks, lab draws from the catheter, and the patient may be discharged home with the catheter. Risks include, but not limited to, infection, bleeding, blood clot (thrombus formation), and puncture of an artery; nerve damage and irregular heartbeat and possibility to perform a PICC exchange if needed/ordered by physician.  Alternatives to this procedure were also discussed.  Bard Power PICC patient education guide, fact sheet on infection prevention and patient information card has been provided to patient /or left at bedside.    PICC Placement Documentation  PICC Double Lumen 11/21/20 PICC Right Brachial 42 cm 0 cm (Active)  Indication for Insertion or Continuance of Line Vasoactive infusions 11/21/20 1216  Exposed Catheter (cm) 0 cm 11/21/20 1216  Site Assessment Clean;Dry;Intact 11/21/20 1216  Lumen #1 Status Flushed;Blood return noted 11/21/20 1216  Lumen #2 Status Flushed;Blood return noted 11/21/20 1216  Dressing Type Transparent 11/21/20 1216  Dressing Status Clean;Dry;Intact 11/21/20 1216  Antimicrobial disc in place? Yes 11/21/20 1216  Dressing Intervention New dressing;Other (Comment) 11/21/20 1216  Dressing Change Due 11/28/20 11/21/20 1216       Reginia Forts Albarece 11/21/2020, 5:17 PM

## 2020-11-21 NOTE — Progress Notes (Signed)
Inpatient Diabetes Program Recommendations  AACE/ADA: New Consensus Statement on Inpatient Glycemic Control (2015)  Target Ranges:  Prepandial:   less than 140 mg/dL      Peak postprandial:   less than 180 mg/dL (1-2 hours)      Critically ill patients:  140 - 180 mg/dL   Lab Results  Component Value Date   GLUCAP 180 (H) 11/21/2020   HGBA1C 10.4 (H) 11/10/2020    Review of Glycemic Control  Diabetes history: DM2 Outpatient Diabetes medications: Farxiga 5 mg QAM, metformin 1000 mg bid Current orders for Inpatient glycemic control: Levemir 10 units Q12H, Novolog 2-6 units Q4H.  HgbA1C - 10.4%. PCP manages his diabetes Had low this am of 65 mg/dL  Inpatient Diabetes Program Recommendations:     Agree with orders.  Educated patient and spouse on insulin pen use at home.   Reviewed all steps of insulin pen including attachment of needle, 2-unit air shot, dialing up dose, giving injection, removing needle, disposal of sharps, storage of unused insulin, disposal of insulin etc. Patient able to provide successful return demonstration. Also reviewed troubleshooting with insulin pen. MD to give patient Rxs for insulin pens and insulin pen needles.  Discussed HgbA1C of 10.4% and goal of 7%. Also discussed hypoglycemia s/s and treatment. Pt has monitored blood sugars in the past, has meter, but has not been using it. States he will check blood sugars 2-3x/day now, "I just got out of the habit." Answered questions. Pt and wife voiced understanding.   Continue to follow.  Thank you. Ailene Ards, RD, LDN, CDE Inpatient Diabetes Coordinator [phone]

## 2020-11-21 NOTE — Progress Notes (Signed)
4 Days Post-Op Procedure(s) (LRB): REDO STERNOTOMY (N/A) INSERTION OF IMPLANTABLE LEFT VENTRICULAR ASSIST DEVICE - HM3 (N/A) TRANSESOPHAGEAL ECHOCARDIOGRAM (TEE) (N/A) Subjective: Feeling better  Objective: Vital signs in last 24 hours: Temp:  [96.26 F (35.7 C)-99.32 F (37.4 C)] 97.16 F (36.2 C) (12/14 0700) Pulse Rate:  [25-148] 78 (12/14 0752) Cardiac Rhythm: Normal sinus rhythm (12/14 0400) Resp:  [11-27] 15 (12/14 0752) BP: (73-127)/(59-106) 92/78 (12/14 0800) SpO2:  [74 %-100 %] 95 % (12/14 0752) FiO2 (%):  [28 %] 28 % (12/14 0752) Weight:  [86 kg] 86 kg (12/14 0500)  Hemodynamic parameters for last 24 hours: PAP: (17-37)/(4-17) 35/17 CVP:  [4 mmHg-33 mmHg] 15 mmHg PCWP:  [15 mmHg] 15 mmHg CO:  [5 L/min] 5 L/min CI:  [2.4 L/min/m2] 2.4 L/min/m2  Intake/Output from previous day: 12/13 0701 - 12/14 0700 In: 1674.3 [I.V.:1674.3] Out: 6910 [Urine:6185; Drains:20; Chest Tube:705] Intake/Output this shift: Total I/O In: 64.7 [I.V.:64.7] Out: -   General appearance: alert and cooperative Neurologic: intact Heart: regular rate and rhythm, S1, S2 normal, no murmur, click, rub or gallop Lungs: clear to auscultation bilaterally Extremities: extremities normal, atraumatic, no cyanosis or edema Wound: c/d/i  Lab Results: Recent Labs    11/20/20 0359 11/20/20 0438 11/21/20 0418  WBC 8.2  --  9.0  HGB 9.0* 9.2* 9.9*  HCT 26.7* 27.0* 31.0*  PLT 132*  --  181   BMET:  Recent Labs    11/20/20 0359 11/20/20 0438 11/20/20 1830 11/21/20 0617  NA 131* 133*  --  133*  K 3.3* 3.4* 3.6 3.3*  CL 96*  --   --  93*  CO2 27  --   --  29  GLUCOSE 104*  --   --  129*  BUN 11  --   --  9  CREATININE 0.56*  --   --  0.68  CALCIUM 8.0*  --   --  8.1*    PT/INR:  Recent Labs    11/21/20 0418  LABPROT 16.6*  INR 1.4*   ABG    Component Value Date/Time   PHART 7.508 (H) 11/20/2020 0438   HCO3 27.9 11/20/2020 0438   TCO2 29 11/20/2020 0438   ACIDBASEDEF 6.0 (H)  11/24/2019 1718   O2SAT 52.3 11/21/2020 0409   CBG (last 3)  Recent Labs    11/21/20 0330 11/21/20 0407 11/21/20 0647  GLUCAP 65* 120* 80    Assessment/Plan: S/P Procedure(s) (LRB): REDO STERNOTOMY (N/A) INSERTION OF IMPLANTABLE LEFT VENTRICULAR ASSIST DEVICE - HM3 (N/A) TRANSESOPHAGEAL ECHOCARDIOGRAM (TEE) (N/A) Mobilize Diuresis leave chest tubes   LOS: 13 days    [name] 11/21/2020

## 2020-11-21 NOTE — Discharge Instructions (Addendum)
[facility] Dr. Madison B. Owsley, DMD Phone: [phone] Fax: [phone]    MOUTH CARE AFTER SURGERY   FACTS:  Ice used in ice bag helps keep the swelling down, and can help lessen the pain.  It is easier to treat pain BEFORE it happens.  Spitting disturbs the clot and may cause bleeding to start again, or to get worse.  Smoking delays healing and can cause complications.  Sharing prescriptions can be dangerous.  Do not take medications not recently prescribed for you.  Antibiotics may stop birth control pills from working.  Use other means of birth control while on antibiotics.  Warm salt water rinses after the first 24 hours will help lessen the swelling:  Use 1/2 teaspoonful of table salt per oz.of water.  DO NOT:  Do not spit.    Do not drink through a straw.  Strongly advised not to smoke, dip snuff or chew tobacco at least for 3 days.  Do not eat sharp or crunchy foods.  Avoid the area of surgery when chewing.  Do not stop your antibiotics before your instructions say to do so.  Do not eat hot foods until bleeding has stopped.  If you need to, let your food cool down to room temperature.  EXPECT:  Some swelling, especially first 2-3 days.  Soreness or discomfort in varying degrees.  Follow your dentist's instructions about how to handle pain before it starts.  Pinkish saliva or light blood in saliva, or on your pillow in the morning.  This can last around 24 hours.  Bruising inside or outside the mouth.  This may not show up until 2-3 days after surgery.  Don't worry, it will go away in time.  Pieces of "bone" may work themselves loose.  It's OK.  If they bother you, let us know.  WHAT TO DO IMMEDIATELY AFTER SURGERY:  Bite on gauze with steady pressure for 30-45 minutes at a time.  Switch out the gauze after 30-45 minutes for clean gauze, and continue this for 1-2 hours or until bleeding subsides.  Do not chew on the  gauze.  Do not lie down flat.  Raise your head support especially for the first 24 hours.  Apply ice to your face on the side of the surgery.  You may apply it 20 minutes on and a few minutes off.  Ice for 8-12 hours.  You may use ice up to 24 hours.  Before the numbness wears off, take a pain pill as instructed.  Prescription pain medication is not always required.  SWELLING:  Expect swelling for the first couple of days.  It should get better after that.  If swelling increases 3 days or so after surgery, let us know as soon as possible.  FEVER:  Take Tylenol every 4 hours if needed to lower your temperature, especially if it is at 100F or higher.  Drink lots of fluids.  If the fever does not go away, let us know.  BREATHING TROUBLE:  Any unusual difficulty breathing means you have to have someone bring you to the emergency room ASAP.  BLEEDING:  Light oozing is expected for 24 hours or so.  Prop head up with pillows.  Do not spit.  Do not confuse bright red fresh flowing blood with lots of saliva colored with a little bit of blood.  If you notice some bleeding, place gauze or a tea bag where it is bleeding and apply CONSTANT pressure by biting   down for 1 hour.  Avoid talking during this time.  Do not remove the gauze or tea bag during this hour to "check" the bleeding.  If you notice bright RED bleeding FLOWING out of particular area, and filling the floor of your mouth, put a wad of gauze on that area, bite down firmly and constantly.  Call us immediately.  If we're closed, have someone bring you to the emergency room.  ORAL HYGIENE:  Brush your teeth as usual after meals and before bedtime.  Use a soft toothbrush around the area of surgery.  DO NOT AVOID BRUSHING.  Otherwise bacteria(germs) will grow and may delay healing or encourage infection.  Since you cannot spit, just gently rinse and let the water flow out of your mouth.  DO NOT SWISH  HARD.  EATING:  Cool liquids are a good point to start.  Increase to soft foods as tolerated.  PRESCRIPTIONS:  Follow the directions for your prescriptions exactly as written.  If your doctor gave you a narcotic pain medication, do not drive, operate machinery or drink alcohol when on that medication.  QUESTIONS:  Call our office during office hours [phone] or call the Emergency Room at [phone].   Information on my medicine - Coumadin   (Warfarin)  This medication education was reviewed with me or my healthcare representative as part of my discharge preparation.    Why was Coumadin prescribed for you? Coumadin was prescribed for you because you have a blood clot or a medical condition that can cause an increased risk of forming blood clots. Blood clots can cause serious health problems by blocking the flow of blood to the heart, lung, or brain. Coumadin can prevent harmful blood clots from forming. As a reminder your indication for Coumadin is: atrial fibrillation and new Heart Mate III LVAD implant   What test will check on my response to Coumadin? While on Coumadin (warfarin) you will need to have an INR test regularly to ensure that your dose is keeping you in the desired range. The INR (international normalized ratio) number is calculated from the result of the laboratory test called prothrombin time (PT).  If an INR APPOINTMENT HAS NOT ALREADY BEEN MADE FOR YOU please schedule an appointment to have this lab work done by your health care provider within 7 days. Your INR goal is a number between: 2-2.5.    What  do you need to  know  About  COUMADIN? Take Coumadin (warfarin) exactly as prescribed by your healthcare provider about the same time each day.  DO NOT stop taking without talking to the doctor who prescribed the medication.  Stopping without other blood clot prevention medication to take the place of Coumadin may increase your risk of developing a new clot or  stroke.  Get refills before you run out.  What do you do if you miss a dose? If you miss a dose, take it as soon as you remember on the same day then continue your regularly scheduled regimen the next day.  Do not take two doses of Coumadin at the same time.  Important Safety Information A possible side effect of Coumadin (Warfarin) is an increased risk of bleeding. You should call your healthcare provider right away if you experience any of the following: ? Bleeding from an injury or your nose that does not stop. ? Unusual colored urine (red or dark brown) or unusual colored stools (red or black). ? Unusual bruising for unknown reasons. ? A serious  fall or if you hit your head (even if there is no bleeding).  Some foods or medicines interact with Coumadin (warfarin) and might alter your response to warfarin. To help avoid this: ? Eat a balanced diet, maintaining a consistent amount of Vitamin K. ? Notify your provider about major diet changes you plan to make. ? Avoid alcohol or limit your intake to 1 drink for women and 2 drinks for men per day. (1 drink is 5 oz. wine, 12 oz. beer, or 1.5 oz. liquor.)  Make sure that ANY health care provider who prescribes medication for you knows that you are taking Coumadin (warfarin).  Also make sure the healthcare provider who is monitoring your Coumadin knows when you have started a new medication including herbals and non-prescription products.  Coumadin (Warfarin)  Major Drug Interactions  Increased Warfarin Effect Decreased Warfarin Effect  Alcohol (large quantities) Antibiotics (esp. Septra/Bactrim, Flagyl, Cipro) Amiodarone (Cordarone) Aspirin (ASA) Cimetidine (Tagamet) Megestrol (Megace) NSAIDs (ibuprofen, naproxen, etc.) Piroxicam (Feldene) Propafenone (Rythmol SR) Propranolol (Inderal) Isoniazid (INH) Posaconazole (Noxafil) Barbiturates (Phenobarbital) Carbamazepine (Tegretol) Chlordiazepoxide (Librium) Cholestyramine  (Questran) Griseofulvin Oral Contraceptives Rifampin Sucralfate (Carafate) Vitamin K   Coumadin (Warfarin) Major Herbal Interactions  Increased Warfarin Effect Decreased Warfarin Effect  Garlic Ginseng Ginkgo biloba Coenzyme Q10 Green tea St. John's wort    Coumadin (Warfarin) FOOD Interactions  Eat a consistent number of servings per week of foods HIGH in Vitamin K (1 serving =  cup)  Collards (cooked, or boiled & drained) Kale (cooked, or boiled & drained) Mustard greens (cooked, or boiled & drained) Parsley *serving size only =  cup Spinach (cooked, or boiled & drained) Swiss chard (cooked, or boiled & drained) Turnip greens (cooked, or boiled & drained)  Eat a consistent number of servings per week of foods MEDIUM-HIGH in Vitamin K (1 serving = 1 cup)  Asparagus (cooked, or boiled & drained) Broccoli (cooked, boiled & drained, or raw & chopped) Brussel sprouts (cooked, or boiled & drained) *serving size only =  cup Lettuce, raw (green leaf, endive, romaine) Spinach, raw Turnip greens, raw & chopped   These websites have more information on Coumadin (warfarin):  http://www.king-russell.com/; https://www.hines.net/;

## 2020-11-21 NOTE — Progress Notes (Signed)
Hypoglycemic Event  CBG: 65 @ 0330  Treatment: 4 oz juice/soda  Symptoms: Sweaty  Follow-up CBG: Time:  0407 CBG Result:  120  Possible Reasons for Event: Medication regimen: over-correction of previous CBG  Comments/MD notified:  n/a    Kevin Underwood

## 2020-11-21 NOTE — Progress Notes (Signed)
Patient ID: Kevin Underwood, male   DOB: [birth year], 59 y.o.   MRN: [id number]     Advanced Heart Failure Rounding Note  PCP-Cardiologist: Nona Dell, MD   Subjective:    - 12/2 Had cath as noted below with intact revascularization. CO-OX 39%, started on milrinone.  - 12/3 Impella 5.5 placed - 12/6 Impella speed increased to P8. Started on lasix drip and given metolazone. Brisk diuresis noted. Negative 5 liters.  - 12/8 Swan removed, PICC placed pre-LVAD.  - 12/10 Impella removed, Heartmate 3 LVAD placed.  - 12/11 Extubated  POD #4.  Now on amiodarone 30 mg/hr and milrinone 0.125.  I/Os negative with Lasix gtt + metolazone and weight down about 10 lbs.  MAP 80s-100s.  Still sore.    Swan CVP 10 PA 37/16 CI 2.2 Co-ox 52%   RHC/LHC 11/09/20  RA 20  PCWP 30-35 PA Sat 40% CO 3 CI 1.5.  1. Severe native coronary artery disease, similar to prior catheterization in 11/2019.  2. Widely patent LIMA-LAD, SVG-D1, and sequential SVG-OM3-rPDA. 3. Severely elevated left and right heart filling pressures. 4. Severely reduced cardiac output/index. 5. Successful placement of leave-in PA catheter via the right internal jugular vein.  LVAD Interrogation HM 3: Speed: 5500 Flow: 3.9 PI: 4.6 Power: 4. 5 PI events.   LDH 356 => 340 => 297 => 340  Objective:   Weight Range: 86 kg Body mass index is 27.2 kg/m.   Vital Signs:   Temp:  [96.26 F (35.7 C)-99.32 F (37.4 C)] 97.16 F (36.2 C) (12/14 0700) Pulse Rate:  [25-148] 78 (12/14 0752) Resp:  [11-27] 15 (12/14 0752) BP: (73-127)/(59-106) 118/106 (12/14 0700) SpO2:  [74 %-100 %] 95 % (12/14 0752) FiO2 (%):  [28 %] 28 % (12/14 0752) Weight:  [86 kg] 86 kg (12/14 0500) Last BM Date: 11/11/20  Weight change: Filed Weights   11/19/20 0500 11/20/20 0500 11/21/20 0500  Weight: 88.7 kg 90.7 kg 86 kg    Intake/Output:   Intake/Output Summary (Last 24 hours) at 11/21/2020 0809 Last data filed at 11/21/2020 0700 Gross per 24  hour  Intake 1674.33 ml  Output 6910 ml  Net -5235.67 ml      Physical Exam  CVP 10 General: Well appearing this am. NAD.  HEENT: Normal. Neck: Supple, JVP 10 cm. Carotids OK.  Cardiac:  Mechanical heart sounds with LVAD hum present.  Lungs:  CTAB, normal effort.  Abdomen:  NT, ND, no HSM. No bruits or masses. +BS  LVAD exit site: Well-healed and incorporated. Dressing dry and intact. No erythema or drainage. Stabilization device present and accurately applied. Driveline dressing changed daily per sterile technique. Extremities:  Warm and dry. No cyanosis, clubbing, rash, or edema.  Neuro:  Alert & oriented x 3. Cranial nerves grossly intact. Moves all 4 extremities w/o difficulty. Affect pleasant     Telemetry   NSR 70s    Labs    CBC Recent Labs    11/20/20 0359 11/20/20 0438 11/21/20 0418  WBC 8.2  --  9.0  NEUTROABS 5.5  --  6.6  HGB 9.0* 9.2* 9.9*  HCT 26.7* 27.0* 31.0*  MCV 86.1  --  88.6  PLT 132*  --  181   Basic Metabolic Panel Recent Labs    40/98/11 0359 11/20/20 0438 11/20/20 1830 11/21/20 0418 11/21/20 0617  NA 131* 133*  --   --  133*  K 3.3* 3.4* 3.6  --  3.3*  CL 96*  --   --   --  93*  CO2 27  --   --   --  29  GLUCOSE 104*  --   --   --  129*  BUN 11  --   --   --  9  CREATININE 0.56*  --   --   --  0.68  CALCIUM 8.0*  --   --   --  8.1*  MG 1.9  --   --  1.9  --   PHOS 2.5  --   --  2.6  --    Liver Function Tests Recent Labs    11/19/20 0436 11/20/20 0359  AST 47* 29  ALT 22 22  ALKPHOS 51 64  BILITOT 1.1 0.7  PROT 5.0* 4.9*  ALBUMIN 2.6* 2.5*   No results for input(s): LIPASE, AMYLASE in the last 72 hours. Cardiac Enzymes No results for input(s): CKTOTAL, CKMB, CKMBINDEX, TROPONINI in the last 72 hours.  BNP: BNP (last 3 results) Recent Labs    09/22/20 0348 11/08/20 1344 11/18/20 0327  BNP 939.0* 910.0* 392.0*    ProBNP (last 3 results) No results for input(s): PROBNP in the last 8760 hours.   D-Dimer No  results for input(s): DDIMER in the last 72 hours. Hemoglobin A1C No results for input(s): HGBA1C in the last 72 hours. Fasting Lipid Panel Recent Labs    11/20/20 1830  TRIG 69   Thyroid Function Tests No results for input(s): TSH, T4TOTAL, T3FREE, THYROIDAB in the last 72 hours.  Invalid input(s): FREET3  Other results:   Imaging    DG Chest 1 View  Result Date: 11/21/2020 CLINICAL DATA:  Chest discomfort.  Left ventricular assist device. EXAM: CHEST  1 VIEW COMPARISON:  11/20/2020. FINDINGS: Swan-Ganz catheter, mediastinal drainage catheter, and left chest tube in stable position. Left ventricular assist device stable position. Prior CABG. Left atrial appendage clip in stable position. Stable cardiomegaly. Low lung volumes with progressive bibasilar and left mid lung atelectasis/infiltrates. Tiny left pleural effusion. No pneumothorax noted on today's exam. Previously identified tiny left apical pneumothorax is resolved. Surgical clips noted over the chest bilaterally. Degenerative changes scoliosis thoracic spine. IMPRESSION: 1. Swan-Ganz catheter, mediastinal drainage catheter, left chest tube in stable position. Left ventricular assist device in stable position. Prior CABG. Left atrial appendage clip in stable position. 2. Low lung volumes with progressive bibasilar and left mid lung atelectasis/infiltrates. 3. Previously identified tiny left apical pneumothorax has resolved. Electronically Signed   By: Maisie Fus  Register   On: 11/21/2020 06:32   DG Chest 1 View  Result Date: 11/20/2020 CLINICAL DATA:  Follow-up LVAD EXAM: CHEST  1 VIEW COMPARISON:  11/19/2020 FINDINGS: Cardiac shadow is stable. Swan-Ganz catheter and left ventricular assist device are again noted and stable. Mediastinal drain and left thoracostomy catheter are again seen. Previously seen pneumothorax on the left is again noted and stable. Postsurgical changes are again seen. Right lung remains clear. Small left  effusion is noted. IMPRESSION: Stable small left pneumothorax. Tubes and lines as described above. No new focal abnormality is noted. Electronically Signed   By: Alcide Clever M.D.   On: 11/20/2020 08:18   [place] EKG SITE RITE  Result Date: 11/20/2020 If Site Rite image not attached, placement could not be confirmed due to current cardiac rhythm.    Medications:     Scheduled Medications:  (feeding supplement) PROSource Plus  30 mL Oral BID BM   acetaminophen  650 mg Oral Q6H   aspirin EC  325 mg Oral Daily   Or  aspirin  324 mg Per Tube Daily   bisacodyl  10 mg Oral Daily   Or   bisacodyl  10 mg Rectal Daily   chlorhexidine  15 mL Mouth Rinse BID   Chlorhexidine Gluconate Cloth  6 each Topical Daily   colchicine  0.6 mg Oral BID   docusate sodium  200 mg Oral Daily   feeding supplement (GLUCERNA SHAKE)  237 mL Oral TID BM   insulin aspart  2-6 Units Subcutaneous Q4H   insulin detemir  10 Units Subcutaneous Q12H   ipratropium-albuterol  3 mL Nebulization BID   lidocaine  1 patch Transdermal Q24H   losartan  25 mg Oral Daily   mouth rinse  15 mL Mouth Rinse q12n4p   mometasone-formoterol  2 puff Inhalation BID   multivitamin with minerals  1 tablet Oral Daily   pantoprazole  40 mg Oral QHS   potassium chloride  20 mEq Oral Q4H   QUEtiapine  25 mg Oral QHS   rosuvastatin  40 mg Oral q1800   sodium chloride flush  10-40 mL Intracatheter Q12H   sodium chloride flush  10-40 mL Intracatheter Q12H   sodium chloride flush  3 mL Intravenous Q12H   warfarin  2.5 mg Oral ONCE-1600   Warfarin - Pharmacist Dosing Inpatient   Does not apply q1600    Infusions:  sodium chloride 20 mL/hr at 11/21/20 0700   sodium chloride     sodium chloride 10 mL/hr at 11/20/20 0600   amiodarone 30 mg/hr (11/21/20 0700)   furosemide (LASIX) 200 mg in dextrose 5% 100 mL (2mg /mL) infusion 12 mg/hr (11/21/20 0700)   heparin 500 Units/hr (11/21/20 0700)   lactated  ringers 10 mL/hr at 11/17/20 0700   lactated ringers     lactated ringers 20 mL/hr at 11/21/20 0700   magnesium sulfate bolus IVPB     milrinone 0.125 mcg/kg/min (11/21/20 0700)    PRN Medications: sodium chloride, acetaminophen, albuterol, alum & mag hydroxide-simeth, aminocaproic acid, dextrose, haloperidol lactate, ondansetron (ZOFRAN) IV, oxyCODONE, sodium chloride flush, sodium chloride flush, sodium chloride flush, traMADol    Patient Profile   Kevin Underwood a 59 y.o.malewith a history of systolic HF, 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 type 2 diabetes mellitus, COPD, and hypertension. Now s/p Heartmate 3 LVAD.   Assessment/Plan   1. Acute on chronic systolic CHF with cardiogenic shock: Echo 10/21 with EF 20-25%, mildly decreased RV function (persistently low). LHC/RHC this admission with patent grafts, low output. Suspect mixed ischemic/nonischemic cardiomyopathy (prior heavy ETOH and drugs as well as CAD).  No ETOH, drugs, smoking for about 1 year since CABG in 12/20. NYHA class IV at admission.  Had placement of Impella 5.5 initially, now s/p Heartmate 3 LVAD on 12/10.  This morning, remains on milrinone 0.125.  CI 2.4 by 14/10 with co-ox 52%, CVP 10 on Lasix gtt 12 mg/hr.   - LVAD stable, will need ramp echo when off milrinone.  - repeat co-ox, stop milrinone if co-ox > 55%.   - Continue Lasix gtt 12 today, no metolazone.  Replace K and Mg.     - Continue ASA 325 mg daily.  Have started warfarin and low dose heparin gtt, INR 1.4.    2. Acute hypoxemic respiratory failure: Now extubated and stable.  3. CAD: S/p CABG 12/20.  LHC this admission with patent grafts, no target  for intervention. - Continue ASA and statin.  4. Atrial fibrillation: Paroxysmal.  S/p Maze and LA appendage clip with CABG in 12/20.  Appears to be in NSR with  PACs.  - Continue amiodarone gtt post-op.  - Anticoagulation started as above.  5. Type 2 diabetes: A1c 10.4, poor control.  Continue insulin.  6. Prior ETOH, drugs: None x 1 year.  7. Anemia: Post-op, transfuse hgb < 8.    8. COPD: PFTs showed FEV1 1.79 (limited study due to LVAD).   CRITICAL CARE Performed by: Marca Ancona  Total critical care time: 40 minutes  Critical care time was exclusive of separately billable procedures and treating other patients.  Critical care was necessary to treat or prevent imminent or life-threatening deterioration.  Critical care was time spent personally by me on the following activities: development of treatment plan with patient and/or surrogate as well as nursing, discussions with consultants, evaluation of patient's response to treatment, examination of patient, obtaining history from patient or surrogate, ordering and performing treatments and interventions, ordering and review of laboratory studies, ordering and review of radiographic studies, pulse oximetry and re-evaluation of patient's condition.    Length of Stay: 30  Marca Ancona, MD  11/21/2020, 8:09 AM  Advanced Heart Failure Team Pager [phone] (M-F; 7a - 4p)  Please contact [facility] for night-coverage after hours (4p -7a ) and weekends on [email or web address]

## 2020-11-21 NOTE — Progress Notes (Signed)
LVAD Coordinator Rounding Note:  Admitted 11/08/20 due to cardiogenic shock.   HM 3 LVAD implanted on 11/17/20 by ZA under DT criteria.  POD #4.  Now on amiodarone 30 mg/hr and milrinone 0.125.  I/Os negative with Lasix gtt + metolazone and weight down about 10 lbs.  MAP 80s-100s.  Still sore.  Pt instructed that he needs to be up in the chair for several hours and ambulate.  Vital signs: Tmax: 97.3 Core HR: 80 NSR Doppler Pressure: 83 Automatic BP: 92/78 (84) O2 Sat: 95 on 2 L/[place] Wt:  199.9>189.6 lbs   LVAD interrogation reveals:   Speed: 5500 Flow: 3.9 Power:  4.0 PI: 4.6 Alarms: none Events:  1 today; 4 yesterday Hematocrit: 31 Fixed speed: 5500 Low speed limit: 5200   Drive Line: Existing VAD dressing removed and site care performed using sterile technique by wife-observed by VAD coordinator. Drive line exit site cleaned with Chlora prep applicators x 2, allowed to dry, and gauze dressing with silver strip re-applied. Exit site healing with small scab and not incorporated, the velour is fully implanted at exit site. Slight redness, no tenderness, drainage, foul odor or rash noted. Drive line anchor secure x 2. Wife performed dressing change today for the first time.        Labs:  LDH trend: 297>340  INR trend: 1.3>1.4  Anticoagulation Plan: -INR Goal: 2-2.5 -ASA Dose: 325 mg d/c date of 12/18/20  Blood Products:  Intra-op 11/17/20: 4 FFP 2 PLT 2 PRBC 2 Cryo 380 cc of cell saver  Post op: 11/19/20>> 1 u/PRBC  Device: -NONE  Respiratory: Extubated 11/18/20  Infection:  Renal:  -BUN/CRT: 11/0.56>9/0.68  Gtts: Milrinone 0.125 mcg/kg/min Lasix 6 mg/hr Amiodarone 30 mg/hr Heparin 500 u/hr  Adverse Events on VAD:  Patient Education: 1. Wife performed dressing change today with verbal cues from VAD coordinator. She did very well for her first time. 2. Continuing education: >>12/13- Pt and caregiver taught how to change from PM to batteries.  Pt/caregiver successfully demonstrated switching power sources. >>12/14- pt and caregiver educated on controller - including all soft keys, uses and internal backup battery. Pt demonstrated self test today.   Plan/Recommendations:   1. Daily dressing changes by VAD Coordinator or nurse champion. Next dressing change due tomorrow 11/22/20. 2. Call VAD coordinator for any equipment issues.  Carlton Adam RN, BSN VAD Coordinator 24/7 Pager [phone]

## 2020-11-21 NOTE — Progress Notes (Addendum)
ANTICOAGULATION CONSULT NOTE   Pharmacy Consult for Warfarin + heparin drip Indication: atrial fibrillation, new Heart Mate III LVAD implant   No Known Allergies  Patient Measurements: Height: 5\' 10"  (177.8 cm) Weight: 86 kg (189 lb 9.5 oz) IBW/kg (Calculated) : 73 Heparin Dosing Weight: 82kg  Vital Signs: Temp: 98.42 F (36.9 C) (12/14 1100) Temp Source: Core (12/14 1200) BP: 102/74 (12/14 1100) Pulse Rate: 78 (12/14 0752)  Labs: Recent Labs    11/19/20 0436 11/19/20 0611 11/20/20 0359 11/20/20 0438 11/21/20 0418 11/21/20 0617  HGB 8.2*   < > 9.0* 9.2* 9.9*  --   HCT 24.5*   < > 26.7* 27.0* 31.0*  --   PLT 144*  --  132*  --  181  --   LABPROT 15.9*  --  15.9*  --  16.6*  --   INR 1.3*  --  1.3*  --  1.4*  --   CREATININE 0.69  --  0.56*  --   --  0.68   < > = values in this interval not displayed.    Estimated Creatinine Clearance: 102.7 mL/min (by C-G formula based on SCr of 0.68 mg/dL).   Medical History: Past Medical History:  Diagnosis Date  . Arthritis   . CAD (coronary artery disease)    a. s/p CABG in 11/2019 with LIMA-LAD, SVG-OM1, SVG-PDA and SVG-D1  . CHF (congestive heart failure) (HCC)    a. EF < 20% by echo in 11/2019  . COPD (chronic obstructive pulmonary disease) (HCC)   . Essential hypertension   . PAF (paroxysmal atrial fibrillation) (HCC)   . Type 2 diabetes mellitus (HCC)      Assessment: 2 yoM admitted with CP and cardiogenic shock. Pt has hx AFib with no AC PTA, pharmacy to start IV heparin.   Impella 5.5 placed 12/3. LVAD placed 12/10.   Started warfarin 12/12 post LVAD implant, INR 1.4 today. Will begin low dose heparin drip 500 uts/hr without titration until INR close to 1.8 Hgb has been low stable postop at improved post PRBC, plt stable 180 - will monitor, LDH improved 350>290.    Goal of Therapy:  INR goal 2-2.5 Heparin level < 0.3 Monitor platelets by anticoagulation protocol: Yes   Plan:  Warfarin 2.5mg  tonight -  repeat Continue heparin drip 500 uts/hr - no titrating Daily INR, CBC, heparin level Will provide education prior to discharge    14/12 Pharm.D. CPP, BCPS Clinical Pharmacist [phone] 11/21/2020 2:11 PM

## 2020-11-22 ENCOUNTER — Inpatient Hospital Stay (HOSPITAL_COMMUNITY): Payer: 59

## 2020-11-22 DIAGNOSIS — I5023 Acute on chronic systolic (congestive) heart failure: Secondary | ICD-10-CM

## 2020-11-22 LAB — PREPARE FRESH FROZEN PLASMA
Unit division: 0
Unit division: 0
Unit division: 0

## 2020-11-22 LAB — CBC WITH DIFFERENTIAL/PLATELET
Abs Immature Granulocytes: 0.05 10*3/uL (ref 0.00–0.07)
Basophils Absolute: 0 10*3/uL (ref 0.0–0.1)
Basophils Relative: 0 %
Eosinophils Absolute: 0.1 10*3/uL (ref 0.0–0.5)
Eosinophils Relative: 1 %
HCT: 32.5 % — ABNORMAL LOW (ref 39.0–52.0)
Hemoglobin: 10.9 g/dL — ABNORMAL LOW (ref 13.0–17.0)
Immature Granulocytes: 0 %
Lymphocytes Relative: 13 %
Lymphs Abs: 1.5 10*3/uL (ref 0.7–4.0)
MCH: 29.3 pg (ref 26.0–34.0)
MCHC: 33.5 g/dL (ref 30.0–36.0)
MCV: 87.4 fL (ref 80.0–100.0)
Monocytes Absolute: 1.3 10*3/uL — ABNORMAL HIGH (ref 0.1–1.0)
Monocytes Relative: 11 %
Neutro Abs: 8.4 10*3/uL — ABNORMAL HIGH (ref 1.7–7.7)
Neutrophils Relative %: 75 %
Platelets: 238 10*3/uL (ref 150–400)
RBC: 3.72 MIL/uL — ABNORMAL LOW (ref 4.22–5.81)
RDW: 15.2 % (ref 11.5–15.5)
WBC: 11.3 10*3/uL — ABNORMAL HIGH (ref 4.0–10.5)
nRBC: 0 % (ref 0.0–0.2)

## 2020-11-22 LAB — HEPARIN LEVEL (UNFRACTIONATED): Heparin Unfractionated: <0.1 IU/mL — ABNORMAL LOW (ref 0.30–0.70)

## 2020-11-22 LAB — MAGNESIUM: Magnesium: 2 mg/dL (ref 1.7–2.4)

## 2020-11-22 LAB — BASIC METABOLIC PANEL
Anion gap: 11 (ref 5–15)
BUN: 10 mg/dL (ref 6–20)
CO2: 30 mmol/L (ref 22–32)
Calcium: 8.6 mg/dL — ABNORMAL LOW (ref 8.9–10.3)
Chloride: 94 mmol/L — ABNORMAL LOW (ref 98–111)
Creatinine, Ser: 0.65 mg/dL (ref 0.61–1.24)
GFR, Estimated: >60 mL/min (ref >60)
Glucose, Bld: 98 mg/dL (ref 70–99)
Potassium: 3.9 mmol/L (ref 3.5–5.1)
Sodium: 135 mmol/L (ref 135–145)

## 2020-11-22 LAB — BPAM FFP
Blood Product Expiration Date: 202112142359
Blood Product Expiration Date: 202112142359
Blood Product Expiration Date: 202112152359
Blood Product Expiration Date: 202112152359
Blood Product Expiration Date: 202112152359
Blood Product Expiration Date: 202112152359
ISSUE DATE / TIME: 202112100720
ISSUE DATE / TIME: 202112100720
ISSUE DATE / TIME: 202112100737
ISSUE DATE / TIME: 202112100737
Unit Type and Rh: 7300
Unit Type and Rh: 7300
Unit Type and Rh: 7300
Unit Type and Rh: 7300
Unit Type and Rh: 7300
Unit Type and Rh: 7300

## 2020-11-22 LAB — COOXEMETRY PANEL
Carboxyhemoglobin: 1.9 % — ABNORMAL HIGH (ref 0.5–1.5)
Carboxyhemoglobin: 1.9 % — ABNORMAL HIGH (ref 0.5–1.5)
Methemoglobin: 0.8 % (ref 0.0–1.5)
Methemoglobin: 1 % (ref 0.0–1.5)
O2 Saturation: 46.1 %
O2 Saturation: 47.8 %
Total hemoglobin: 11.3 g/dL — ABNORMAL LOW (ref 12.0–16.0)
Total hemoglobin: 11.3 g/dL — ABNORMAL LOW (ref 12.0–16.0)

## 2020-11-22 LAB — PROTIME-INR
INR: 1.6 — ABNORMAL HIGH (ref 0.8–1.2)
Prothrombin Time: 18.1 seconds — ABNORMAL HIGH (ref 11.4–15.2)

## 2020-11-22 LAB — GLUCOSE, CAPILLARY
Glucose-Capillary: 90 mg/dL (ref 70–99)
Glucose-Capillary: 74 mg/dL (ref 70–99)
Glucose-Capillary: 194 mg/dL — ABNORMAL HIGH (ref 70–99)
Glucose-Capillary: 156 mg/dL — ABNORMAL HIGH (ref 70–99)
Glucose-Capillary: 174 mg/dL — ABNORMAL HIGH (ref 70–99)
Glucose-Capillary: 197 mg/dL — ABNORMAL HIGH (ref 70–99)

## 2020-11-22 LAB — ECHOCARDIOGRAM LIMITED
Height: 70 in
Weight: 2839.52 oz

## 2020-11-22 LAB — PHOSPHORUS: Phosphorus: 3.8 mg/dL (ref 2.5–4.6)

## 2020-11-22 LAB — LACTATE DEHYDROGENASE: LDH: 260 U/L — ABNORMAL HIGH (ref 98–192)

## 2020-11-22 MED ORDER — INSULIN ASPART 100 UNIT/ML ~~LOC~~ SOLN
2.0000 [IU] | Freq: Three times a day (TID) | SUBCUTANEOUS | Status: DC
  Administered 2020-11-22: 22:00:00 4 [IU] via SUBCUTANEOUS
  Administered 2020-11-23: 22:00:00 6 [IU] via SUBCUTANEOUS
  Administered 2020-11-23 (×2): 4 [IU] via SUBCUTANEOUS
  Administered 2020-11-23: 12:00:00 6 [IU] via SUBCUTANEOUS
  Administered 2020-11-24 (×2): 2 [IU] via SUBCUTANEOUS
  Administered 2020-11-24: 22:00:00 4 [IU] via SUBCUTANEOUS
  Administered 2020-11-24: 07:00:00 2 [IU] via SUBCUTANEOUS
  Administered 2020-11-25 (×2): 4 [IU] via SUBCUTANEOUS
  Administered 2020-11-26 (×2): 2 [IU] via SUBCUTANEOUS
  Administered 2020-11-26 – 2020-11-27 (×2): 4 [IU] via SUBCUTANEOUS
  Administered 2020-11-28: 06:00:00 2 [IU] via SUBCUTANEOUS

## 2020-11-22 MED ORDER — WARFARIN SODIUM 2.5 MG PO TABS
2.5000 mg | ORAL_TABLET | Freq: Once | ORAL | Status: AC
  Administered 2020-11-22: 17:00:00 2.5 mg via ORAL
  Filled 2020-11-22: qty 1

## 2020-11-22 MED ORDER — SACUBITRIL-VALSARTAN 24-26 MG PO TABS
1.0000 | ORAL_TABLET | Freq: Two times a day (BID) | ORAL | Status: DC
  Administered 2020-11-22 – 2020-11-28 (×13): 1 via ORAL
  Filled 2020-11-22 (×14): qty 1

## 2020-11-22 MED ORDER — FUROSEMIDE 40 MG PO TABS
40.0000 mg | ORAL_TABLET | Freq: Every day | ORAL | Status: DC
  Administered 2020-11-22 – 2020-11-28 (×7): 40 mg via ORAL
  Filled 2020-11-22 (×7): qty 1

## 2020-11-22 NOTE — Progress Notes (Signed)
  Echocardiogram 2D Echocardiogram has been performed.  Kevin Underwood 11/22/2020, 2:01 PM

## 2020-11-22 NOTE — Progress Notes (Signed)
Speed  Flow  PI  Power  LVIDD  AI  Aortic openings  MR  TR  Septum  RV   5500 4.5 3.1 4.0 4.3 none 1/5  mild mild midline Mild  5600  4.5 3.2 4.1   0/5 mild mild Pulling to left mild                                                       Doppler MAP: 77   Ramp ECHO performed at bedside.  At completion of ramp study:  Fixed speed: 5500 Low speed limit: 5200  Carlton Adam RN, VAD Coordinator 24/7 pager [phone]

## 2020-11-22 NOTE — Progress Notes (Signed)
Patient ID: Kevin Underwood, male   DOB: 18-Nov-1961, 59 y.o.   MRN: [id number]     Advanced Heart Failure Rounding Note  PCP-Cardiologist: Nona Dell, MD   Subjective:    - 12/2 Had cath as noted below with intact revascularization. CO-OX 39%, started on milrinone.  - 12/3 Impella 5.5 placed - 12/6 Impella speed increased to P8. Started on lasix drip and given metolazone. Brisk diuresis noted. Negative 5 liters.  - 12/8 Swan removed, PICC placed pre-LVAD.  - 12/10 Impella removed, Heartmate 3 LVAD placed.  - 12/11 Extubated  POD #5.  Now on amiodarone 30 mg/hr off milrinone.  I/Os negative with Lasix gtt, down about 12 lbs.  MAP 80s-100s.  Rhythm may be atrial flutter vs NSR with PACs.    Swan CVP 7 PA 22/8 CI 2.5 Co-ox 46% (early am)  RHC/LHC 11/09/20  RA 20  PCWP 30-35 PA Sat 40% CO 3 CI 1.5.  1. Severe native coronary artery disease, similar to prior catheterization in 11/2019.  2. Widely patent LIMA-LAD, SVG-D1, and sequential SVG-OM3-rPDA. 3. Severely elevated left and right heart filling pressures. 4. Severely reduced cardiac output/index. 5. Successful placement of leave-in PA catheter via the right internal jugular vein.  LVAD Interrogation HM 3: Speed: 5500 Flow: 3.8 PI: 5.4 Power: 4. .   LDH 356 => 340 => 297 => 340 => 260  Objective:   Weight Range: 80.5 kg Body mass index is 25.46 kg/m.   Vital Signs:   Temp:  [97.52 F (36.4 C)-98.6 F (37 C)] 98 F (36.7 C) (12/15 0742) Pulse Rate:  [26-107] 90 (12/15 0755) Resp:  [10-24] 18 (12/15 0755) BP: (78-154)/(59-139) 131/75 (12/15 0605) SpO2:  [75 %-100 %] 95 % (12/15 0605) FiO2 (%):  [21 %] 21 % (12/15 0755) Weight:  [80.5 kg] 80.5 kg (12/15 0500) Last BM Date: 11/11/20  Weight change: Filed Weights   11/20/20 0500 11/21/20 0500 11/22/20 0500  Weight: 90.7 kg 86 kg 80.5 kg    Intake/Output:   Intake/Output Summary (Last 24 hours) at 11/22/2020 0805 Last data filed at 11/22/2020  0700 Gross per 24 hour  Intake 1916.3 ml  Output 5295 ml  Net -3378.7 ml      Physical Exam  CVP 7 General: Well appearing this am. NAD.  HEENT: Normal. Neck: Supple, JVP 7-8 cm. Carotids OK.  Cardiac:  Mechanical heart sounds with LVAD hum present.  Lungs:  CTAB, normal effort.  Abdomen:  NT, ND, no HSM. No bruits or masses. +BS  LVAD exit site: Well-healed and incorporated. Dressing dry and intact. No erythema or drainage. Stabilization device present and accurately applied. Driveline dressing changed daily per sterile technique. Extremities:  Warm and dry. No cyanosis, clubbing, rash, or edema.  Neuro:  Alert & oriented x 3. Cranial nerves grossly intact. Moves all 4 extremities w/o difficulty. Affect pleasant     Telemetry   ?Atrial flutter versus NSR with PACs.  Personally reviewed   Labs    CBC Recent Labs    11/21/20 0418 11/22/20 0322  WBC 9.0 11.3*  NEUTROABS 6.6 8.4*  HGB 9.9* 10.9*  HCT 31.0* 32.5*  MCV 88.6 87.4  PLT 181 238   Basic Metabolic Panel Recent Labs    92/33/00 0418 11/21/20 0617 11/22/20 0322 11/22/20 0622  NA  --  133*  --  135  K  --  3.3*  --  3.9  CL  --  93*  --  94*  CO2  --  29  --  30  GLUCOSE  --  129*  --  98  BUN  --  9  --  10  CREATININE  --  0.68  --  0.65  CALCIUM  --  8.1*  --  8.6*  MG 1.9  --  2.0  --   PHOS 2.6  --  3.8  --    Liver Function Tests Recent Labs    11/20/20 0359  AST 29  ALT 22  ALKPHOS 64  BILITOT 0.7  PROT 4.9*  ALBUMIN 2.5*   No results for input(s): LIPASE, AMYLASE in the last 72 hours. Cardiac Enzymes No results for input(s): CKTOTAL, CKMB, CKMBINDEX, TROPONINI in the last 72 hours.  BNP: BNP (last 3 results) Recent Labs    09/22/20 0348 11/08/20 1344 11/18/20 0327  BNP 939.0* 910.0* 392.0*    ProBNP (last 3 results) No results for input(s): PROBNP in the last 8760 hours.   D-Dimer No results for input(s): DDIMER in the last 72 hours. Hemoglobin A1C No results for  input(s): HGBA1C in the last 72 hours. Fasting Lipid Panel Recent Labs    11/20/20 1830  TRIG 69   Thyroid Function Tests No results for input(s): TSH, T4TOTAL, T3FREE, THYROIDAB in the last 72 hours.  Invalid input(s): FREET3  Other results:   Imaging    DG Chest Port 1 View  Result Date: 11/21/2020 CLINICAL DATA:  Central catheter positioning EXAM: PORTABLE CHEST 1 VIEW COMPARISON:  November 21, 2020. FINDINGS: Peripherally inserted central catheter on the right has its tip in the superior vena cava. Swan-Ganz catheter tip is in the right main pulmonary artery. Chest tube present on the left. Catheter overlies the left hemithorax with tip overlying inferior heart border. Left ventricular assist device present. No pneumothorax. Small pleural effusions noted bilaterally with bibasilar atelectasis. Lungs elsewhere clear. Heart is prominent but stable. Pulmonary vascularity is normal. There is aortic atherosclerosis. No bone lesions. Surgical clips noted in each axillary region. IMPRESSION: New central catheter tip in superior vena cava. Other tube and catheter positions as described pneumothorax. Small pleural effusions bilaterally with bibasilar atelectasis. Stable cardiac silhouette. Aortic Atherosclerosis (ICD10-I70.0). Electronically Signed   By: Bretta Bang III M.D.   On: 11/21/2020 12:53     Medications:     Scheduled Medications: . (feeding supplement) PROSource Plus  30 mL Oral BID BM  . acetaminophen  650 mg Oral Q6H  . aspirin EC  325 mg Oral Daily   Or  . aspirin  324 mg Per Tube Daily  . bisacodyl  10 mg Oral Daily   Or  . bisacodyl  10 mg Rectal Daily  . chlorhexidine  15 mL Mouth Rinse BID  . Chlorhexidine Gluconate Cloth  6 each Topical Daily  . colchicine  0.6 mg Oral BID  . docusate sodium  200 mg Oral Daily  . feeding supplement (GLUCERNA SHAKE)  237 mL Oral TID BM  . furosemide  40 mg Oral Daily  . insulin aspart  2-6 Units Subcutaneous Q4H  .  insulin detemir  10 Units Subcutaneous Q12H  . ipratropium-albuterol  3 mL Nebulization BID  . lidocaine  1 patch Transdermal Q24H  . mouth rinse  15 mL Mouth Rinse q12n4p  . mometasone-formoterol  2 puff Inhalation BID  . multivitamin with minerals  1 tablet Oral Daily  . pantoprazole  40 mg Oral QHS  . QUEtiapine  25 mg Oral QHS  . rosuvastatin  40 mg Oral q1800  .  sacubitril-valsartan  1 tablet Oral BID  . sodium chloride flush  10-40 mL Intracatheter Q12H  . sodium chloride flush  10-40 mL Intracatheter Q12H  . sodium chloride flush  3 mL Intravenous Q12H  . Warfarin - Pharmacist Dosing Inpatient   Does not apply q1600    Infusions: . sodium chloride 20 mL/hr at 11/22/20 0700  . sodium chloride    . sodium chloride Stopped (11/21/20 2144)  . amiodarone 30 mg/hr (11/22/20 0700)  . heparin 500 Units/hr (11/22/20 0700)  . lactated ringers 10 mL/hr at 11/17/20 0700  . lactated ringers    . lactated ringers Stopped (11/21/20 2142)    PRN Medications: sodium chloride, acetaminophen, albuterol, alum & mag hydroxide-simeth, aminocaproic acid, dextrose, haloperidol lactate, ondansetron (ZOFRAN) IV, oxyCODONE, sodium chloride flush, sodium chloride flush, sodium chloride flush, traMADol    Patient Profile   Kevin Underwood a 59 y.o.malewith a history of systolic HF, 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 type 2 diabetes mellitus, COPD, and hypertension. Now s/p Heartmate 3 LVAD.   Assessment/Plan   1. Acute on chronic systolic CHF with cardiogenic shock: Echo 10/21 with EF 20-25%, mildly decreased RV function (persistently low). LHC/RHC this admission with patent grafts, low output. Suspect mixed ischemic/nonischemic cardiomyopathy (prior heavy ETOH and drugs as well as CAD).  No ETOH, drugs, smoking for about 1 year since CABG in 12/20.  NYHA class IV at admission.  Had placement of Impella 5.5 initially, now s/p Heartmate 3 LVAD on 12/10.  This morning, off milrinone.  CI 2.5 by Ernestine Conrad with co-ox 46% (early am), CVP 7 on Lasix gtt 12 mg/hr.   - LVAD stable, ramp echo later today.  - repeat co-ox this morning, would not restart milrinone give good thermo CI.    - Stop Lasix gtt, start po.      - Continue ASA 325 mg daily.  Have started warfarin and low dose heparin gtt, INR 1.6.    2. Acute hypoxemic respiratory failure: Now extubated and stable.  3. CAD: S/p CABG 12/20.  LHC this admission with patent grafts, no target for intervention. - Continue ASA and statin.  4. Atrial fibrillation: Paroxysmal.  S/p Maze and LA appendage clip with CABG in 12/20.  ?Atrial flutter today. - Continue amiodarone gtt post-op.  - Anticoagulation started as above. - ECG today to identify rhythm.   5. Type 2 diabetes: A1c 10.4, poor control.  Continue insulin.  6. Prior ETOH, drugs: None x 1 year.  7. Anemia: Post-op, transfuse hgb < 8.    8. COPD: PFTs showed FEV1 1.79 (limited study due to LVAD).   CRITICAL CARE Performed by: Marca Ancona  Total critical care time: 40 minutes  Critical care time was exclusive of separately billable procedures and treating other patients.  Critical care was necessary to treat or prevent imminent or life-threatening deterioration.  Critical care was time spent personally by me on the following activities: development of treatment plan with patient and/or surrogate as well as nursing, discussions with consultants, evaluation of patient's response to treatment, examination of patient, obtaining history from patient or surrogate, ordering and performing treatments and interventions, ordering and review of laboratory studies, ordering and review of radiographic studies, pulse oximetry and re-evaluation of patient's condition.    Length of Stay: 14  Marca Ancona, MD  11/22/2020, 8:05 AM  Advanced Heart  Failure Team  Pager [phone] (M-F; [place])  Please contact [facility] for night-coverage after hours (4p -7a ) and weekends on [email or web address]

## 2020-11-22 NOTE — Progress Notes (Signed)
LVAD Coordinator Rounding Note:  Admitted 11/08/20 due to cardiogenic shock.   HM 3 LVAD implanted on 11/17/20 by ZA under DT criteria.  POD #5.  Now on amiodarone 30 mg/hr off milrinone.  I/Os negative with Lasix gtt, down about 12 lbs.  MAP 80s-100s.  Rhythm appears to be NSR with PACs.    Pt up in the chair this morning, states that he walked around the unit early this morning.  We will perform RAMP echo today at 1:30 at bedside.  Vital signs: Tmax: 98 HR: 90 NSR w/PACs Doppler Pressure: 83 Automatic BP: 131/75 (90) O2 Sat: 95 on RA Wt:  199.9>189.6>177.4 lbs   LVAD interrogation reveals:   Speed: 5500 Flow: 4 Power:  4.1 PI: 4.4 Alarms: none Events:  10 today Hematocrit: 33 Fixed speed: 5500 Low speed limit: 5200   Drive Line: Existing VAD dressing removed and site care performed using sterile technique by wife-observed by VAD coordinator. Drive line exit site cleaned with Chlora prep applicators x 2, allowed to dry, and gauze dressing with silver strip re-applied. Exit site healing with small scab and not incorporated, the velour is fully implanted at exit site. Slight redness, no tenderness, drainage, foul odor or rash noted. Drive line anchor secure x 2. Wife performed dressing change today.         Labs:  LDH trend: 297>340>260  INR trend: 1.3>1.4>1.6  Anticoagulation Plan: -INR Goal: 2-2.5 -ASA Dose: 325 mg d/c date of 12/18/20  Blood Products:  Intra-op 11/17/20: 4 FFP 2 PLT 2 PRBC 2 Cryo 380 cc of cell saver  Post op: 11/19/20>> 1 u/PRBC  Device: -NONE  Respiratory: Extubated 11/18/20  Infection:  Renal:  -BUN/CRT: 11/0.56>9/0.68>10/0.65  Gtts: Milrinone 0.125 mcg/kg/min-off Lasix 6 mg/hr-off Amiodarone 30 mg/hr Heparin 500 u/hr  Adverse Events on VAD:  Patient Education: 1. Wife performed dressing change today for the second time with very few verbal cues from VAD coordinator. She did very well. 2. Continuing  education: >>12/13- Pt and caregiver taught how to change from PM to batteries. Pt/caregiver successfully demonstrated switching power sources. >>12/14- pt and caregiver educated on controller - including all soft keys, uses and internal backup battery. Pt demonstrated self test today.   Plan/Recommendations:   1. Daily dressing changes by VAD Coordinator or nurse champion. Next dressing change due 11/23/20. 2. Call VAD coordinator for any equipment issues.   Carlton Adam RN, BSN VAD Coordinator 24/7 Pager [phone]

## 2020-11-22 NOTE — Progress Notes (Signed)
ANTICOAGULATION CONSULT NOTE   Pharmacy Consult for Warfarin + heparin drip Indication: atrial fibrillation, new Heart Mate III LVAD implant   No Known Allergies  Patient Measurements: Height: 5\' 10"  (177.8 cm) Weight: 80.5 kg (177 lb 7.5 oz) IBW/kg (Calculated) : 73 Heparin Dosing Weight: 82kg  Vital Signs: Temp: 98.1 F (36.7 C) (12/15 1106) Temp Source: Oral (12/15 1106) BP: 95/75 (12/15 1202) Pulse Rate: 47 (12/15 1300)  Labs: Recent Labs    11/20/20 0359 11/20/20 0438 11/21/20 0418 11/21/20 0617 11/22/20 0322 11/22/20 0323 11/22/20 0622  HGB 9.0* 9.2* 9.9*  --  10.9*  --   --   HCT 26.7* 27.0* 31.0*  --  32.5*  --   --   PLT 132*  --  181  --  238  --   --   LABPROT 15.9*  --  16.6*  --  18.1*  --   --   INR 1.3*  --  1.4*  --  1.6*  --   --   HEPARINUNFRC  --   --   --   --   --  <0.10*  --   CREATININE 0.56*  --   --  0.68  --   --  0.65    Estimated Creatinine Clearance: 102.7 mL/min (by C-G formula based on SCr of 0.65 mg/dL).   Medical History: Past Medical History:  Diagnosis Date  . Arthritis   . CAD (coronary artery disease)    a. s/p CABG in 11/2019 with LIMA-LAD, SVG-OM1, SVG-PDA and SVG-D1  . CHF (congestive heart failure) (HCC)    a. EF < 20% by echo in 11/2019  . COPD (chronic obstructive pulmonary disease) (HCC)   . Essential hypertension   . PAF (paroxysmal atrial fibrillation) (HCC)   . Type 2 diabetes mellitus (HCC)      Assessment: 72 yoM admitted with CP and cardiogenic shock. Pt has hx AFib with no AC PTA, pharmacy to start IV heparin.   Impella 5.5 placed 12/3. LVAD placed 12/10.   Started warfarin 12/12 post LVAD implant, INR 1.6 today. Currently on low dose heparin drip 500 uts/hr without titration until INR close to 1.8 Hgb has been low stable postop at improved post PRBC, plt stable 180 - will monitor, LDH improved 350>290.    Goal of Therapy:  INR goal 2-2.5 Heparin level < 0.3 Monitor platelets by anticoagulation  protocol: Yes   Plan:  Warfarin 2.5mg  tonight - repeat Continue heparin drip 500 uts/hr - no titrating Daily INR, CBC, heparin level Will provide education prior to discharge    14/12 Pharm.D. CPP, BCPS Clinical Pharmacist [phone] 11/22/2020 2:42 PM

## 2020-11-23 ENCOUNTER — Inpatient Hospital Stay (HOSPITAL_COMMUNITY): Payer: 59

## 2020-11-23 LAB — CBC
HCT: 33.9 % — ABNORMAL LOW (ref 39.0–52.0)
Hemoglobin: 10.9 g/dL — ABNORMAL LOW (ref 13.0–17.0)
MCH: 28.2 pg (ref 26.0–34.0)
MCHC: 32.2 g/dL (ref 30.0–36.0)
MCV: 87.6 fL (ref 80.0–100.0)
Platelets: 256 10*3/uL (ref 150–400)
RBC: 3.87 MIL/uL — ABNORMAL LOW (ref 4.22–5.81)
RDW: 14.8 % (ref 11.5–15.5)
WBC: 10.9 10*3/uL — ABNORMAL HIGH (ref 4.0–10.5)
nRBC: 0 % (ref 0.0–0.2)

## 2020-11-23 LAB — BASIC METABOLIC PANEL
Anion gap: 12 (ref 5–15)
BUN: 9 mg/dL (ref 6–20)
CO2: 26 mmol/L (ref 22–32)
Calcium: 8.3 mg/dL — ABNORMAL LOW (ref 8.9–10.3)
Chloride: 94 mmol/L — ABNORMAL LOW (ref 98–111)
Creatinine, Ser: 0.54 mg/dL — ABNORMAL LOW (ref 0.61–1.24)
GFR, Estimated: >60 mL/min (ref >60)
Glucose, Bld: 166 mg/dL — ABNORMAL HIGH (ref 70–99)
Potassium: 3.5 mmol/L (ref 3.5–5.1)
Sodium: 132 mmol/L — ABNORMAL LOW (ref 135–145)

## 2020-11-23 LAB — COOXEMETRY PANEL
Carboxyhemoglobin: 2 % — ABNORMAL HIGH (ref 0.5–1.5)
Methemoglobin: 0.8 % (ref 0.0–1.5)
O2 Saturation: 52.8 %
Total hemoglobin: 11.1 g/dL — ABNORMAL LOW (ref 12.0–16.0)

## 2020-11-23 LAB — PROTIME-INR
INR: 1.8 — ABNORMAL HIGH (ref 0.8–1.2)
Prothrombin Time: 20.4 seconds — ABNORMAL HIGH (ref 11.4–15.2)

## 2020-11-23 LAB — GLUCOSE, CAPILLARY
Glucose-Capillary: 160 mg/dL — ABNORMAL HIGH (ref 70–99)
Glucose-Capillary: 203 mg/dL — ABNORMAL HIGH (ref 70–99)
Glucose-Capillary: 155 mg/dL — ABNORMAL HIGH (ref 70–99)
Glucose-Capillary: 211 mg/dL — ABNORMAL HIGH (ref 70–99)

## 2020-11-23 LAB — HEPARIN LEVEL (UNFRACTIONATED): Heparin Unfractionated: <0.1 IU/mL — ABNORMAL LOW (ref 0.30–0.70)

## 2020-11-23 LAB — LACTATE DEHYDROGENASE: LDH: 235 U/L — ABNORMAL HIGH (ref 98–192)

## 2020-11-23 LAB — MAGNESIUM: Magnesium: 1.9 mg/dL (ref 1.7–2.4)

## 2020-11-23 LAB — PHOSPHORUS: Phosphorus: 3.3 mg/dL (ref 2.5–4.6)

## 2020-11-23 MED ORDER — POTASSIUM CHLORIDE CRYS ER 20 MEQ PO TBCR
20.0000 meq | EXTENDED_RELEASE_TABLET | ORAL | Status: AC
  Administered 2020-11-23 (×3): 20 meq via ORAL
  Filled 2020-11-23 (×3): qty 1

## 2020-11-23 MED ORDER — DIGOXIN 125 MCG PO TABS
0.1250 mg | ORAL_TABLET | Freq: Every day | ORAL | Status: DC
  Administered 2020-11-23 – 2020-11-28 (×6): 0.125 mg via ORAL
  Filled 2020-11-23 (×6): qty 1

## 2020-11-23 MED ORDER — AMIODARONE HCL 200 MG PO TABS
200.0000 mg | ORAL_TABLET | Freq: Two times a day (BID) | ORAL | Status: DC
  Administered 2020-11-23 – 2020-11-28 (×11): 200 mg via ORAL
  Filled 2020-11-23 (×11): qty 1

## 2020-11-23 MED ORDER — WARFARIN SODIUM 2.5 MG PO TABS
2.5000 mg | ORAL_TABLET | Freq: Once | ORAL | Status: AC
  Administered 2020-11-23: 16:00:00 2.5 mg via ORAL
  Filled 2020-11-23: qty 1

## 2020-11-23 MED ORDER — INSULIN DETEMIR 100 UNIT/ML ~~LOC~~ SOLN
10.0000 [IU] | Freq: Two times a day (BID) | SUBCUTANEOUS | Status: DC
  Administered 2020-11-23 – 2020-11-28 (×11): 10 [IU] via SUBCUTANEOUS
  Filled 2020-11-23 (×14): qty 0.1

## 2020-11-23 NOTE — Progress Notes (Addendum)
Patient ID: Kevin Underwood, male   DOB: February 08, 1961, 59 y.o.   MRN: [id number]     Advanced Heart Failure Rounding Note  PCP-Cardiologist: Kevin Dell, MD   Subjective:    - 12/2 Had cath as noted below with intact revascularization. CO-OX 39%, started on milrinone.  - 12/3 Impella 5.5 placed - 12/6 Impella speed increased to P8. Started on lasix drip and given metolazone. Brisk diuresis noted. Negative 5 liters.  - 12/8 Swan removed, PICC placed pre-LVAD.  - 12/10 Impella removed, Heartmate 3 LVAD placed.  - 12/11 Extubated - 12/15 Ramp echo: Speed left at 5500  POD #6.  Now on amiodarone 30 mg/hr and off milrinone.   Co-ox 53%.  CVP 6-7. CTs remain in place.   OOB, sitting up in chair. Finished breakfast. Appetite good. Tired and wants to get back in bed. No dyspnea.     RHC/LHC 11/09/20  RA 20  PCWP 30-35 PA Sat 40% CO 3 CI 1.5.  1. Severe native coronary artery disease, similar to prior catheterization in 11/2019.  2. Widely patent LIMA-LAD, SVG-D1, and sequential SVG-OM3-rPDA. 3. Severely elevated left and right heart filling pressures. 4. Severely reduced cardiac output/index. 5. Successful placement of leave-in PA catheter via the right internal jugular vein.  LVAD Interrogation HM 3: Speed: 5500 Flow: 4.4 PI: 4.0 Power: 4.0. 10 PI events   LDH 356 => 340 => 297 => 340 => 260 => 235   Objective:   Weight Range: 80.8 kg Body mass index is 25.56 kg/m.   Vital Signs:   Temp:  [98 F (36.7 C)-98.6 F (37 C)] 98.1 F (36.7 C) (12/16 0724) Pulse Rate:  [25-105] 53 (12/16 0500) Resp:  [7-28] 15 (12/16 0700) BP: (92-139)/(52-105) 115/88 (12/16 0700) SpO2:  [90 %-100 %] 100 % (12/16 0700) FiO2 (%):  [21 %] 21 % (12/15 1936) Weight:  [80.8 kg] 80.8 kg (12/16 0500) Last BM Date: 11/22/20  Weight change: Filed Weights   11/21/20 0500 11/22/20 0500 11/23/20 0500  Weight: 86 kg 80.5 kg 80.8 kg    Intake/Output:   Intake/Output Summary (Last 24 hours) at  11/23/2020 0739 Last data filed at 11/23/2020 0700 Gross per 24 hour  Intake 763.22 ml  Output 1455 ml  Net -691.78 ml      Physical Exam   CVP 6-7 General: Fatigue appearing male, sitting up in chair. No respiratory difficulty  HEENT: Normal. Neck: Supple, 7 cm. Carotids OK.  Cardiac:  Mechanical heart sounds with LVAD hum present. + CTs  Lungs:  Faint bibasilar crackles, normal effort.  Abdomen:  NT, ND, no HSM. No bruits or masses. +BS  LVAD exit site: Well-healed and incorporated. Dressing dry and intact. No erythema or drainage. Stabilization device present and accurately applied. Driveline dressing changed daily per sterile technique. Extremities:  Warm and dry. No cyanosis, clubbing, rash, or edema. Cool LEs  Neuro:  Alert & oriented x 3. Cranial nerves grossly intact. Moves all 4 extremities w/o difficulty. Affect pleasant     Telemetry   ? Aflutter, 90s   Personally reviewed   Labs    CBC Recent Labs    11/21/20 0418 11/22/20 0322 11/23/20 0428  WBC 9.0 11.3* 10.9*  NEUTROABS 6.6 8.4*  --   HGB 9.9* 10.9* 10.9*  HCT 31.0* 32.5* 33.9*  MCV 88.6 87.4 87.6  PLT 181 238 256   Basic Metabolic Panel Recent Labs    62/26/33 0322 11/22/20 0622 11/23/20 0428  NA  --  135  132*  K  --  3.9 3.5  CL  --  94* 94*  CO2  --  30 26  GLUCOSE  --  98 166*  BUN  --  10 9  CREATININE  --  0.65 0.54*  CALCIUM  --  8.6* 8.3*  MG 2.0  --  1.9  PHOS 3.8  --  3.3   Liver Function Tests No results for input(s): AST, ALT, ALKPHOS, BILITOT, PROT, ALBUMIN in the last 72 hours. No results for input(s): LIPASE, AMYLASE in the last 72 hours. Cardiac Enzymes No results for input(s): CKTOTAL, CKMB, CKMBINDEX, TROPONINI in the last 72 hours.  BNP: BNP (last 3 results) Recent Labs    09/22/20 0348 11/08/20 1344 11/18/20 0327  BNP 939.0* 910.0* 392.0*    ProBNP (last 3 results) No results for input(s): PROBNP in the last 8760 hours.   D-Dimer No results for  input(s): DDIMER in the last 72 hours. Hemoglobin A1C No results for input(s): HGBA1C in the last 72 hours. Fasting Lipid Panel Recent Labs    11/20/20 1830  TRIG 69   Thyroid Function Tests No results for input(s): TSH, T4TOTAL, T3FREE, THYROIDAB in the last 72 hours.  Invalid input(s): FREET3  Other results:   Imaging    ECHOCARDIOGRAM LIMITED  Result Date: 11/22/2020    ECHOCARDIOGRAM LIMITED REPORT   Patient Name:   Kevin Underwood Date of Exam: 11/22/2020 Medical Rec #:  [id number]        Height:       70.0 in Accession #:    [id number]       Weight:       177.5 lb Date of Birth:  [birth year]        BSA:          1.984 m Patient Age:    59 years         BP:           00/00 mmHg Patient Gender: M                HR:           47 bpm. Exam Location:  Inpatient Procedure: Limited Echo, Color Doppler and Cardiac Doppler Indications:    CHF  History:        Patient has prior history of Echocardiogram examinations, most                 recent 11/17/2020. CHF and Cardiomyopathy, CAD, Prior CABG,                 COPD, Arrythmias:Atrial Fibrillation; Risk Factors:Hypertension.                 Impella, LVAD.  Sonographer:    Kevin Underwood Referring Phys: [phone] Kevin Underwood IMPRESSIONS  1. Left ventricular endocardial border not optimally defined to evaluate regional wall motion. Left ventricular diastolic function could not be evaluated.  2. Right ventricular systolic function was not well visualized. The right ventricular size is not well visualized.  3. The mitral valve is normal in structure. No evidence of mitral valve regurgitation. No evidence of mitral stenosis.  4. The aortic valve was not well visualized.  5. The inferior vena cava is normal in size with greater than 50% respiratory variability, suggesting right atrial pressure of 3 mmHg.LVAD Ramp protocol performed. FINDINGS  Left Ventricle: Left ventricular endocardial border not optimally defined to evaluate regional wall motion.  The left ventricular internal cavity size was normal  in size. There is no left ventricular hypertrophy. Left ventricular diastolic function could  not be evaluated. Right Ventricle: The right ventricular size is not well visualized. Right vetricular wall thickness was not assessed. Right ventricular systolic function was not well visualized. Left Atrium: Left atrial size was normal in size. Right Atrium: Right atrial size was normal in size. Pericardium: There is no evidence of pericardial effusion. Mitral Valve: The mitral valve is normal in structure. Mild mitral annular calcification. No evidence of mitral valve stenosis. Tricuspid Valve: The tricuspid valve is normal in structure. Tricuspid valve regurgitation is not demonstrated. No evidence of tricuspid stenosis. Aortic Valve: The aortic valve was not well visualized. Pulmonic Valve: The pulmonic valve was normal in structure. Pulmonic valve regurgitation is not visualized. No evidence of pulmonic stenosis. Aorta: The aortic root is normal in size and structure. Venous: The inferior vena cava is normal in size with greater than 50% respiratory variability, suggesting right atrial pressure of 3 mmHg. IAS/Shunts: No atrial level shunt detected by color flow Doppler. LEFT VENTRICLE PLAX 2D LVIDd:         4.35 cm  Armanda Magic MD Electronically signed by Armanda Magic MD Signature Date/Time: 11/22/2020/2:42:30 PM    Final      Medications:     Scheduled Medications: . (feeding supplement) PROSource Plus  30 mL Oral BID BM  . acetaminophen  650 mg Oral Q6H  . aspirin EC  325 mg Oral Daily   Or  . aspirin  324 mg Per Tube Daily  . bisacodyl  10 mg Oral Daily   Or  . bisacodyl  10 mg Rectal Daily  . Chlorhexidine Gluconate Cloth  6 each Topical Daily  . colchicine  0.6 mg Oral BID  . docusate sodium  200 mg Oral Daily  . feeding supplement (GLUCERNA SHAKE)  237 mL Oral TID BM  . furosemide  40 mg Oral Daily  . insulin aspart  2-6 Units  Subcutaneous TID AC & HS  . ipratropium-albuterol  3 mL Nebulization BID  . lidocaine  1 patch Transdermal Q24H  . mometasone-formoterol  2 puff Inhalation BID  . multivitamin with minerals  1 tablet Oral Daily  . pantoprazole  40 mg Oral QHS  . potassium chloride  20 mEq Oral Q4H  . QUEtiapine  25 mg Oral QHS  . rosuvastatin  40 mg Oral q1800  . sacubitril-valsartan  1 tablet Oral BID  . sodium chloride flush  10-40 mL Intracatheter Q12H  . sodium chloride flush  10-40 mL Intracatheter Q12H  . sodium chloride flush  3 mL Intravenous Q12H  . Warfarin - Pharmacist Dosing Inpatient   Does not apply q1600    Infusions: . sodium chloride 20 mL/hr at 11/22/20 1100  . sodium chloride    . sodium chloride Stopped (11/21/20 2144)  . amiodarone 30 mg/hr (11/23/20 0700)  . heparin 500 Units/hr (11/23/20 0700)  . lactated ringers 10 mL/hr at 11/17/20 0700  . lactated ringers    . lactated ringers Stopped (11/21/20 2142)    PRN Medications: sodium chloride, acetaminophen, albuterol, alum & mag hydroxide-simeth, aminocaproic acid, dextrose, haloperidol lactate, ondansetron (ZOFRAN) IV, oxyCODONE, sodium chloride flush, sodium chloride flush, sodium chloride flush, traMADol    Patient Profile   Kevin Underwood a 59 y.o.malewith a history of systolic HF, 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 type 2  diabetes mellitus, COPD, and hypertension. Now s/p Heartmate 3 LVAD.   Assessment/Plan   1. Acute on chronic systolic CHF with cardiogenic shock: Echo 10/21 with EF 20-25%, mildly decreased RV function (persistently low). LHC/RHC this admission with patent grafts, low output. Suspect mixed ischemic/nonischemic cardiomyopathy (prior heavy ETOH and drugs as well as CAD).  No ETOH, drugs, smoking for about 1 year since CABG in 12/20. NYHA class IV at  admission.  Had placement of Impella 5.5 initially, now s/p Heartmate 3 LVAD on 12/10.  Off milrinone. Co-ox marginal at 53%. CVP 6-7. SCr 0.54.  - Continue PO Lasix 40 mg  - Continue Entresto 24-26 BID - Add digoxin 0.125  - Continue ASA 325 mg daily.  Have started warfarin and low dose heparin gtt, INR 1.8.    2. Acute hypoxemic respiratory failure: Now extubated and stable.  3. CAD: S/p CABG 12/20.  LHC this admission with patent grafts, no target for intervention. - Continue ASA and statin.  4. Atrial fibrillation: Paroxysmal.  S/p Maze and LA appendage clip with CABG in 12/20.  ?Atrial flutter today. - Switch to PO amio 200 mg bid.   - Anticoagulation started as above. 5. Type 2 diabetes: A1c 10.4, poor control.  Continue insulin.  6. Prior ETOH, drugs: None x 1 year.  7. Anemia: Post-op, transfuse hgb < 8.    8. COPD: PFTs showed FEV1 1.79 (limited study due to LVAD).   Length of Stay: [address] PA-C  11/23/2020, 7:39 AM  Advanced Heart Failure Team Pager [phone] (M-F; 7a - 4p)  Please contact [facility] for night-coverage after hours (4p -7a ) and weekends on [email or web address]  Patient seen with PA, agree with the above note.   Today, INR up to 1.8.  CVP remains 6-7 on po Lasix.  Co-ox 53%.  MAP 70s-90s. Ramp echo yesterday, left speed at 5500.   General: Well appearing this am. NAD.  HEENT: Normal. Neck: Supple, JVP 7-8 cm. Carotids OK.  Cardiac:  Mechanical heart sounds with LVAD hum present.  Lungs:  CTAB, normal effort.  Abdomen:  NT, ND, no HSM. No bruits or masses. +BS  LVAD exit site: Well-healed and incorporated. Dressing dry and intact. No erythema or drainage. Stabilization device present and accurately applied. Driveline dressing changed daily per sterile technique. Extremities:  Warm and dry. No cyanosis, clubbing, rash, or edema.  Neuro:  Alert & oriented x 3. Cranial nerves grossly intact. Moves all 4 extremities w/o difficulty. Affect pleasant     Stable today.  Remains in NSR.  - Stop IV amiodarone, start amiodarone 200 mg bid.  - Agree with digoxin - Continue po Lasix - Continue heparin gtt until INR 2, then stop heparin and decrease ASA to 81 mg daily.  - ?Chest tubes out, minimal drainage per nurse.  - Mobilize.   Marca Ancona 11/23/2020 9:38 AM

## 2020-11-23 NOTE — Progress Notes (Signed)
LVAD Coordinator Rounding Note:  Admitted 11/08/20 due to cardiogenic shock.   HM 3 LVAD implanted on 11/17/20 by ZA under DT criteria.  POD #6.  Now on amiodarone 30 mg/hr off milrinone. Plan to transition to PO Amiodarone today.  I/Os negative with PO Lasix.  MAP 80s-100s.  Rhythm appears to be NSR with PACs.    Pt back in bed this morning after spending a few hours in the chair, and walking 2 full laps in the hallway. Denies dizziness/lightheadedness since speed increase yesterday; denies with ambulation. He states he is tired today as he did not get any sleep last night. Reports left shoulder pain, but otherwise "feels good".   Vital signs: Tmax: 98.1 HR: 90 NSR w/PACs Doppler Pressure: 100 Automatic BP: 100/77 (86) O2 Sat: 98 on RA Wt:  199.9>189.6>177.4>178.1 lbs   LVAD interrogation reveals:  Speed: 5500 Flow: 4.0 Power:  4.0w  PI: 4.3 Alarms: none Events:  10 today Hematocrit: 33  Fixed speed: 5500 Low speed limit: 5200   Drive Line: Existing VAD dressing removed and site care performed using sterile technique by wife-observed by VAD coordinator. Drive line exit site cleaned with Chlora prep applicators x 2, allowed to dry, and gauze dressing with silver strip re-applied. Exit site healing with small scab and not incorporated, the velour is fully implanted at exit site. Suture intact. Slight redness, no tenderness, drainage, foul odor or rash noted. Drive line anchor secure x 2. Wife performed dressing change today. Continue daily dressing change at this time. May be able to advance to every other day dressing changes soon.     Labs:  LDH trend: 297>340>260>235  INR trend: 1.3>1.4>1.6>1.8  Anticoagulation Plan: -INR Goal: 2-2.5 -ASA Dose: 325 mg d/c date of 12/18/20  Blood Products:  Intra-op 11/17/20: 4 FFP 2 PLT 2 PRBC 2 Cryo 380 cc of cell saver  Post op: 11/19/20>> 1 u/PRBC  Device: -NONE  Respiratory: Extubated 11/18/20  Infection:  Renal:   -BUN/CRT: 11/0.56>9/0.68>10/0.65> 9/0.54  Gtts: Milrinone 0.125 mcg/kg/min-off Lasix 6 mg/hr-off Amiodarone 30 mg/hr--plan to turn off today Heparin 500 u/hr  Adverse Events on VAD:  Patient Education: 1. Wife performed dressing change today for the third time with very few verbal cues from VAD coordinator. She did very well. 2. Continuing education: >>12/13- Pt and caregiver taught how to change from PM to batteries. Pt/caregiver successfully demonstrated switching power sources. >>12/14- pt and caregiver educated on controller - including all soft keys, uses and internal backup battery. Pt demonstrated self test today. >>12/16- pt and wife state they feel "very comfortable" with power source changes, the controller, and performing self test. Pt reports he is tired and would like to rest today.    Plan/Recommendations:  1. Daily dressing changes by VAD Coordinator or nurse champion. Next dressing change due 11/24/20. 2. Call VAD coordinator for any equipment issues.   Alyce Pagan RN VAD Coordinator  Office: [phone]  24/7 Pager: [phone]

## 2020-11-23 NOTE — Progress Notes (Signed)
CT surgery p.m. rounds  Stable day walking hallway Chest tubes out Satisfactory VAD parameters  Blood pressure 104/90, pulse 73, temperature 97.7 F (36.5 C), resp. rate 19, height 5\' 10"  (1.778 m), weight 80.8 kg, SpO2 98 %.

## 2020-11-23 NOTE — Progress Notes (Signed)
PT Cancellation Note  Patient Details Name: WYLAN GENTZLER MRN: [id number] DOB: 08/02/1961   Cancelled Treatment:    Reason Eval/Treat Not Completed: Medical issues which prohibited therapy (Pt walked x 2 this am and just had dressing change.)   Berline Lopes 11/23/2020, 10:01 AM Stylianos Stradling W,PT Acute Rehabilitation Services Pager:  [phone]  Office:  [phone]

## 2020-11-23 NOTE — Progress Notes (Signed)
ANTICOAGULATION CONSULT NOTE   Pharmacy Consult for Warfarin + heparin drip Indication: atrial fibrillation, new Heart Mate III LVAD implant   No Known Allergies  Patient Measurements: Height: 5\' 10"  (177.8 cm) Weight: 80.8 kg (178 lb 2.1 oz) (Held LVAD batteries up) IBW/kg (Calculated) : 73 Heparin Dosing Weight: 82kg  Vital Signs: Temp: 98.3 F (36.8 C) (12/16 1110) Temp Source: Oral (12/16 0332) BP: 92/75 (12/16 1200) Pulse Rate: 76 (12/16 1300)  Labs: Recent Labs    11/21/20 0418 11/21/20 0617 11/22/20 0322 11/22/20 0323 11/22/20 0622 11/23/20 0428  HGB 9.9*  --  10.9*  --   --  10.9*  HCT 31.0*  --  32.5*  --   --  33.9*  PLT 181  --  238  --   --  256  LABPROT 16.6*  --  18.1*  --   --  20.4*  INR 1.4*  --  1.6*  --   --  1.8*  HEPARINUNFRC  --   --   --  <0.10*  --  <0.10*  CREATININE  --  0.68  --   --  0.65 0.54*    Estimated Creatinine Clearance: 102.7 mL/min (A) (by C-G formula based on SCr of 0.54 mg/dL (L)).   Medical History: Past Medical History:  Diagnosis Date  . Arthritis   . CAD (coronary artery disease)    a. s/p CABG in 11/2019 with LIMA-LAD, SVG-OM1, SVG-PDA and SVG-D1  . CHF (congestive heart failure) (HCC)    a. EF < 20% by echo in 11/2019  . COPD (chronic obstructive pulmonary disease) (HCC)   . Essential hypertension   . PAF (paroxysmal atrial fibrillation) (HCC)   . Type 2 diabetes mellitus (HCC)      Assessment: 55 yoM admitted with CP and cardiogenic shock. Pt has hx AFib with no AC PTA, pharmacy to start IV heparin.   Impella 5.5 placed 12/3. LVAD placed 12/10.   Started warfarin 12/12 post LVAD implant, INR 1.8 today has risen nicely on warfarin 2.5mg  daily. Currently on low dose heparin drip 500 uts/hr without titration until INR close to 1.8 - HL remains < 0.1 will stop today  Hgb has been low stable postop at improved post PRBC, plt fell slightly post op now improved 250s - will monitor, LDH improved 350>235   Goal of  Therapy:  INR goal 2-2.5 Heparin level < 0.3 Monitor platelets by anticoagulation protocol: Yes   Plan:  Warfarin 2.5mg  tonight - repeat Stop heparin Daily INR, CBC, heparin level Will provide education prior to discharge    14/12 Pharm.D. CPP, BCPS Clinical Pharmacist [phone] 11/23/2020 1:39 PM

## 2020-11-24 ENCOUNTER — Inpatient Hospital Stay (HOSPITAL_COMMUNITY): Payer: 59

## 2020-11-24 LAB — URINALYSIS, ROUTINE W REFLEX MICROSCOPIC
Bilirubin Urine: NEGATIVE
Glucose, UA: NEGATIVE mg/dL
Hgb urine dipstick: NEGATIVE
Ketones, ur: NEGATIVE mg/dL
Leukocytes,Ua: NEGATIVE
Nitrite: NEGATIVE
Protein, ur: NEGATIVE mg/dL
Specific Gravity, Urine: 1.017 (ref 1.005–1.030)
pH: 6 (ref 5.0–8.0)

## 2020-11-24 LAB — BASIC METABOLIC PANEL
Anion gap: 8 (ref 5–15)
BUN: 9 mg/dL (ref 6–20)
CO2: 26 mmol/L (ref 22–32)
Calcium: 8.2 mg/dL — ABNORMAL LOW (ref 8.9–10.3)
Chloride: 98 mmol/L (ref 98–111)
Creatinine, Ser: 0.51 mg/dL — ABNORMAL LOW (ref 0.61–1.24)
GFR, Estimated: >60 mL/min (ref >60)
Glucose, Bld: 192 mg/dL — ABNORMAL HIGH (ref 70–99)
Potassium: 3.8 mmol/L (ref 3.5–5.1)
Sodium: 132 mmol/L — ABNORMAL LOW (ref 135–145)

## 2020-11-24 LAB — COOXEMETRY PANEL
Carboxyhemoglobin: 2 % — ABNORMAL HIGH (ref 0.5–1.5)
Methemoglobin: 0.7 % (ref 0.0–1.5)
O2 Saturation: 51.2 %
Total hemoglobin: 12.1 g/dL (ref 12.0–16.0)

## 2020-11-24 LAB — GLUCOSE, CAPILLARY
Glucose-Capillary: 137 mg/dL — ABNORMAL HIGH (ref 70–99)
Glucose-Capillary: 142 mg/dL — ABNORMAL HIGH (ref 70–99)
Glucose-Capillary: 173 mg/dL — ABNORMAL HIGH (ref 70–99)

## 2020-11-24 LAB — CBC
HCT: 33.7 % — ABNORMAL LOW (ref 39.0–52.0)
Hemoglobin: 10.8 g/dL — ABNORMAL LOW (ref 13.0–17.0)
MCH: 28.1 pg (ref 26.0–34.0)
MCHC: 32 g/dL (ref 30.0–36.0)
MCV: 87.8 fL (ref 80.0–100.0)
Platelets: 301 10*3/uL (ref 150–400)
RBC: 3.84 MIL/uL — ABNORMAL LOW (ref 4.22–5.81)
RDW: 14.6 % (ref 11.5–15.5)
WBC: 15.3 10*3/uL — ABNORMAL HIGH (ref 4.0–10.5)
nRBC: 0 % (ref 0.0–0.2)

## 2020-11-24 LAB — PROTIME-INR
INR: 1.7 — ABNORMAL HIGH (ref 0.8–1.2)
Prothrombin Time: 19.4 seconds — ABNORMAL HIGH (ref 11.4–15.2)

## 2020-11-24 LAB — HEPARIN LEVEL (UNFRACTIONATED): Heparin Unfractionated: <0.1 IU/mL — ABNORMAL LOW (ref 0.30–0.70)

## 2020-11-24 LAB — BRAIN NATRIURETIC PEPTIDE: B Natriuretic Peptide: 252 pg/mL — ABNORMAL HIGH (ref 0.0–100.0)

## 2020-11-24 LAB — LACTATE DEHYDROGENASE: LDH: 228 U/L — ABNORMAL HIGH (ref 98–192)

## 2020-11-24 LAB — MAGNESIUM: Magnesium: 1.8 mg/dL (ref 1.7–2.4)

## 2020-11-24 LAB — PHOSPHORUS: Phosphorus: 3.4 mg/dL (ref 2.5–4.6)

## 2020-11-24 MED ORDER — COUMADIN BOOK
Freq: Once | Status: AC
  Filled 2020-11-24: qty 1

## 2020-11-24 MED ORDER — POTASSIUM CHLORIDE CRYS ER 20 MEQ PO TBCR: 20.0000 meq | EXTENDED_RELEASE_TABLET | ORAL | Status: DC

## 2020-11-24 MED ORDER — WARFARIN SODIUM 4 MG PO TABS
4.0000 mg | ORAL_TABLET | Freq: Once | ORAL | Status: AC
  Administered 2020-11-24: 18:00:00 4 mg via ORAL
  Filled 2020-11-24: qty 1

## 2020-11-24 MED ORDER — MAGNESIUM SULFATE 2 GM/50ML IV SOLN
2.0000 g | Freq: Once | INTRAVENOUS | Status: AC
  Administered 2020-11-24: 08:00:00 2 g via INTRAVENOUS
  Filled 2020-11-24: qty 50

## 2020-11-24 MED ORDER — HEPARIN (PORCINE) 25000 UT/250ML-% IV SOLN
500.0000 [IU]/h | INTRAVENOUS | Status: DC
  Administered 2020-11-24: 08:00:00 500 [IU]/h via INTRAVENOUS
  Filled 2020-11-24: qty 250

## 2020-11-24 NOTE — Progress Notes (Signed)
7 Days Post-Op Procedure(s) (LRB): REDO STERNOTOMY (N/A) INSERTION OF IMPLANTABLE LEFT VENTRICULAR ASSIST DEVICE - HM3 (N/A) TRANSESOPHAGEAL ECHOCARDIOGRAM (TEE) (N/A) Subjective: Feeling better  Objective: Vital signs in last 24 hours: Temp:  [97.7 F (36.5 C)-98.3 F (36.8 C)] 97.8 F (36.6 C) (12/17 0750) Pulse Rate:  [25-109] 79 (12/17 0700) Cardiac Rhythm: Normal sinus rhythm (12/16 2000) Resp:  [13-26] 18 (12/17 0700) BP: (86-166)/(75-137) 166/137 (12/17 0700) SpO2:  [71 %-100 %] 97 % (12/17 0700) Weight:  [82 kg] 82 kg (12/17 0500)  Hemodynamic parameters for last 24 hours: CVP:  [0 mmHg-16 mmHg] 9 mmHg  Intake/Output from previous day: 12/16 0701 - 12/17 0700 In: 562.3 [P.O.:480; I.V.:82.3] Out: 1015 [Urine:950; Drains:15; Chest Tube:50] Intake/Output this shift: No intake/output data recorded.  General appearance: alert and cooperative Neurologic: intact Heart: regular rate and rhythm, S1, S2 normal, no murmur, click, rub or gallop Lungs: clear to auscultation bilaterally Abdomen: soft, non-tender; bowel sounds normal; no masses,  no organomegaly Extremities: edema mild Wound: c/d/i  Lab Results: Recent Labs    11/22/20 0322 11/23/20 0428  WBC 11.3* 10.9*  HGB 10.9* 10.9*  HCT 32.5* 33.9*  PLT 238 256   BMET:  Recent Labs    11/22/20 0622 11/23/20 0428  NA 135 132*  K 3.9 3.5  CL 94* 94*  CO2 30 26  GLUCOSE 98 166*  BUN 10 9  CREATININE 0.65 0.54*  CALCIUM 8.6* 8.3*    PT/INR:  Recent Labs    11/24/20 0426  LABPROT 19.4*  INR 1.7*   ABG    Component Value Date/Time   PHART 7.508 (H) 11/20/2020 0438   HCO3 27.9 11/20/2020 0438   TCO2 29 11/20/2020 0438   ACIDBASEDEF 6.0 (H) 11/24/2019 1718   O2SAT 51.2 11/24/2020 0434   CBG (last 3)  Recent Labs    11/23/20 1506 11/23/20 2142 11/24/20 0624  GLUCAP 155* 211* 137*    Assessment/Plan: S/P Procedure(s) (LRB): REDO STERNOTOMY (N/A) INSERTION OF IMPLANTABLE LEFT VENTRICULAR  ASSIST DEVICE - HM3 (N/A) TRANSESOPHAGEAL ECHOCARDIOGRAM (TEE) (N/A) Plan for transfer to step-down: see transfer orders  Continue anticoagulation PT/OT   LOS: 16 days    Kevin Underwood 11/24/2020

## 2020-11-24 NOTE — Progress Notes (Signed)
Physical Therapy Treatment Patient Details Name: Kevin Underwood MRN: [id number] DOB: 04/28/1961 Today's Date: 11/24/2020    History of Present Illness Kevin Underwood is a 59 y.o. 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 Pt adm 12/01 with Acute on chronic systolic CHF with cardiogenic shock. Had impella placed 12/3. Underwent LVAD on 11/17/20.    PT Comments    Pt admitted with above diagnosis. Pt was able to ambulate with rollator on unit with min to min guard assist. Needed mod assist with power up from recliner.  Pt needs cues for manipulating equipment as well as he could not remember to tell therapist he needed battery clips.  Pt continues to progress but still needs cues and assist at times.  Pt currently with functional limitations due to balance and endruance deficits. Pt will benefit from skilled PT to increase their independence and safety with mobility to allow discharge to the venue listed below.     Follow Up Recommendations  Home health PT;Supervision/Assistance - 24 hour (May progress and not need HHPT)     Equipment Recommendations  Other (comment) (has rollator to borrow per pt)    Recommendations for Other Services       Precautions / Restrictions Precautions Precautions: Fall;Sternal;Other (comment) (LVAD) Precaution Booklet Issued: Yes (comment) Precaution Comments: continued education re: driveline safety and securing system controller.  Cues for manipulating equipment. Pt has LVAD Restrictions Weight Bearing Restrictions: Yes (Sternal precautions) LUE Weight Bearing: Non weight bearing Other Position/Activity Restrictions: Sternal precautions    Mobility  Bed Mobility Overal bed mobility: Needs Assistance             General bed mobility comments: up in chair on arrrival.  Pt remembered  all equipment except couldnt tell therapist to get clips with the batteries.  He rushes at times with switching from wall power to batteries and encouraged pt to take his time.  Transfers Overall transfer level: Needs assistance Equipment used: 4-wheeled walker Transfers: Sit to/from Stand Sit to Stand: Mod assist;Min assist;+2 physical assistance         General transfer comment: Min A to mod assist for safety and balance into standing with mod assist for power up from low recliner with hands on knees  Ambulation/Gait Ambulation/Gait assistance: Min assist;Min guard Gait Distance (Feet): 300 Feet Assistive device: 4-wheeled walker Gait Pattern/deviations: Step-through pattern;Decreased stride length;Trunk flexed   Gait velocity interpretation: <1.31 ft/sec, indicative of household ambulator General Gait Details: Pt walked on unit with rollator with min to min guard assist.   Cues for pursed lip breathing with pt on RA and VSS. Pts speed 5500 throughout, flow 4.0 to 3.9  with ambulation and PI 3.8 to 5.4 after ambulation.  Pt did well with locking and unlocking brakes prn.   Stairs             Wheelchair Mobility    Modified Rankin (Stroke Patients Only)       Balance Overall balance assessment: Needs assistance Sitting-balance support: No upper extremity supported;Feet supported Sitting balance-Leahy Scale: Fair     Standing balance support: Bilateral upper extremity supported;During functional activity Standing balance-Leahy Scale: Poor Standing balance comment: UE support on rollator  Cognition Arousal/Alertness: Awake/alert Behavior During Therapy: WFL for tasks assessed/performed Overall Cognitive Status: Within Functional Limits for tasks assessed                                 General Comments: Very agreeable to therapy      Exercises General Exercises - Lower Extremity Ankle Circles/Pumps:  AROM;Both;10 reps;Seated Long Arc Quad: AROM;Both;10 reps;Seated    General Comments        Pertinent Vitals/Pain Pain Assessment: No/denies pain    Home Living                      Prior Function            PT Goals (current goals can now be found in the care plan section) Acute Rehab PT Goals Patient Stated Goal: to get stronger Progress towards PT goals: Progressing toward goals    Frequency    Min 3X/week      PT Plan Current plan remains appropriate    Co-evaluation              AM-PAC PT "6 Clicks" Mobility   Outcome Measure  Help needed turning from your back to your side while in a flat bed without using bedrails?: A Little Help needed moving from lying on your back to sitting on the side of a flat bed without using bedrails?: A Little Help needed moving to and from a bed to a chair (including a wheelchair)?: A Little Help needed standing up from a chair using your arms (e.g., wheelchair or bedside chair)?: A Little Help needed to walk in hospital room?: A Little Help needed climbing 3-5 steps with a railing? : A Lot 6 Click Score: 17    End of Session Equipment Utilized During Treatment: Gait belt Activity Tolerance: Patient tolerated treatment well Patient left: with call bell/phone within reach;in chair Nurse Communication: Mobility status PT Visit Diagnosis: Muscle weakness (generalized) (M62.81);Other abnormalities of gait and mobility (R26.89)     Time: 9179-1505 PT Time Calculation (min) (ACUTE ONLY): 23 min  Charges:  $Gait Training: 23-37 mins                     Tracey Stewart W,PT Acute Rehabilitation Services Pager:  [phone]  Office:  [phone]     Berline Lopes 11/24/2020, 4:29 PM

## 2020-11-24 NOTE — Progress Notes (Signed)
Nutrition Follow-up  DOCUMENTATION CODES:   Non-severe (moderate) malnutrition in context of chronic illness  INTERVENTION:   Glucerna Shake po TID, each supplement provides 220 kcal and 10 grams of protein  Change diet to Dysphagia 3 (Easy to chew) from SOFT (easy to digest) given s/p dental extraction   NUTRITION DIAGNOSIS:   Moderate Malnutrition related to chronic illness (uncontrolled DM, CHF) as evidenced by mild fat depletion,moderate muscle depletion,energy intake < 75% for > or equal to 1 month.   Being addressed via diet advancement, supplements  GOAL:   Patient will meet greater than or equal to 90% of their needs  Progressing  MONITOR:   PO intake,Supplement acceptance,Weight trends,Labs,I & O's  REASON FOR ASSESSMENT:   Consult LVAD Eval  ASSESSMENT:   59 year old male who presented to the ED on 12/01 with chest pain. PMH of CAD s/p CABG December 2020, CHF, HTN, HLD, EtOH and cocaine abuse (substance-free over the last year), T2DM, COPD.   12/02- right/left heart cath 12/03- impella 12/09- dentral extraction, 13 teeth removed 12/10- HM3 LVAD implantation, redo sternotomy  Diet advanced to Soft (GI soft); good appetite and eating well, some problems chewing given dental extraction.  Pt drinking 1-3 Glucerna shakes daily. Pt does not like Pro-Source and was not taking and was therefore discontinued  Constipation has resolved; pt with large BM yesterday  Labs: reviewed Meds: lasix, ss novolog, levemir, MVI with Minerals  Diet Order:   Diet Order            DIET SOFT Room service appropriate? Yes; Fluid consistency: Thin  Diet effective now                 EDUCATION NEEDS:   Education needs have been addressed  Skin:  Skin Assessment: Skin Integrity Issues: Skin Integrity Issues:: Incisions Incisions: L chest  Last BM:  12/16  Height:   Ht Readings from Last 1 Encounters:  11/16/20 5\' 10"  (1.778 m)    Weight:   Wt Readings from  Last 1 Encounters:  11/24/20 82 kg    Ideal Body Weight:  75.5 kg  BMI:  Body mass index is 25.94 kg/m.  Estimated Nutritional Needs:   Kcal:  2300-2500  Protein:  115-130 grams  Fluid:  2.0 L/day  11/26/20 MS, RDN, LDN, CNSC Registered Dietitian III Clinical Nutrition RD Pager and On-Call Pager Number Located in [place]

## 2020-11-24 NOTE — Plan of Care (Signed)
  Problem: Education: Goal: Knowledge of General Education information will improve Description: Including pain rating scale, medication(s)/side effects and non-pharmacologic comfort measures Outcome: Progressing   Problem: Health Behavior/Discharge Planning: Goal: Ability to manage health-related needs will improve Outcome: Progressing   Problem: Clinical Measurements: Goal: Ability to maintain clinical measurements within normal limits will improve Outcome: Progressing Goal: Will remain free from infection Outcome: Progressing Goal: Diagnostic test results will improve Outcome: Progressing Goal: Respiratory complications will improve Outcome: Progressing Goal: Cardiovascular complication will be avoided Outcome: Progressing   Problem: Activity: Goal: Risk for activity intolerance will decrease Outcome: Progressing   Problem: Nutrition: Goal: Adequate nutrition will be maintained Outcome: Progressing   Problem: Activity: Goal: Risk for activity intolerance will decrease Outcome: Progressing   Problem: Coping: Goal: Level of anxiety will decrease Outcome: Progressing   Problem: Elimination: Goal: Will not experience complications related to bowel motility Outcome: Progressing Goal: Will not experience complications related to urinary retention Outcome: Progressing   Problem: Pain Managment: Goal: General experience of comfort will improve Outcome: Progressing   Problem: Safety: Goal: Ability to remain free from injury will improve Outcome: Progressing   Problem: Skin Integrity: Goal: Risk for impaired skin integrity will decrease Outcome: Progressing   Problem: Education: Goal: Understanding of cardiac disease, CV risk reduction, and recovery process will improve Outcome: Progressing Goal: Individualized Educational Video(s) Outcome: Progressing   Problem: Activity: Goal: Ability to tolerate increased activity will improve Outcome: Progressing    Problem: Cardiac: Goal: Ability to achieve and maintain adequate cardiovascular perfusion will improve Outcome: Progressing   Problem: Health Behavior/Discharge Planning: Goal: Ability to safely manage health-related needs after discharge will improve Outcome: Progressing   Problem: Education: Goal: Understanding of CV disease, CV risk reduction, and recovery process will improve Outcome: Progressing Goal: Individualized Educational Video(s) Outcome: Progressing   Problem: Activity: Goal: Ability to return to baseline activity level will improve Outcome: Progressing   Problem: Cardiovascular: Goal: Ability to achieve and maintain adequate cardiovascular perfusion will improve Outcome: Progressing Goal: Vascular access site(s) Level 0-1 will be maintained Outcome: Progressing   Problem: Health Behavior/Discharge Planning: Goal: Ability to safely manage health-related needs after discharge will improve Outcome: Progressing   Problem: Education: Goal: Knowledge of the prescribed therapeutic regimen will improve Outcome: Progressing   Problem: Activity: Goal: Risk for activity intolerance will decrease Outcome: Progressing   Problem: Cardiac: Goal: Ability to maintain an adequate cardiac output will improve Outcome: Progressing   Problem: Coping: Goal: Level of anxiety will decrease Outcome: Progressing   Problem: Fluid Volume: Goal: Risk for excess fluid volume will decrease Outcome: Progressing   Problem: Clinical Measurements: Goal: Ability to maintain clinical measurements within normal limits will improve Outcome: Progressing Goal: Will remain free from infection Outcome: Progressing   Problem: Respiratory: Goal: Will regain and/or maintain adequate ventilation Outcome: Progressing

## 2020-11-24 NOTE — Progress Notes (Addendum)
LVAD Coordinator Rounding Note:  Admitted 11/08/20 due to cardiogenic shock.   HM 3 LVAD implanted on 11/17/20 by ZA under DT criteria.  POD #7.  Chest tubes removed yesterday. No events overnight. OOB sitting up in chair. Feels well. No dyspnea. O2 sats 97% on RA.   Pt states he is tired. Is having trouble sleeping at night in the hospital.  Vital signs: Tmax: 97.9 HR: 93 NSR w/PACs Doppler Pressure: 114 Automatic BP: 100/80 (89) O2 Sat: 98 on RA Wt:  199.9>189.6>177.4>178.1>180.7 lbs   LVAD interrogation reveals:  Speed: 5500 Flow: 4.2 Power:  4.0w  PI: 3.6 Alarms: none Events:  3 today Hematocrit: 33  Fixed speed: 5500 Low speed limit: 5200   Drive Line: Existing VAD dressing removed and site care performed using sterile technique by wife-observed by VAD coordinator. Drive line exit site cleaned with Chlora prep applicators x 2, allowed to dry, and gauze dressing with silver strip re-applied. Exit site healing with small scab and not incorporated, the velour is fully implanted at exit site. Suture intact. Slight redness, no tenderness, drainage, foul odor or rash noted. Drive line anchor secure x 2. Wife performed dressing change today. May advance to every other day dressing changes.        Labs:  LDH trend: 297>340>260>235>228  INR trend: 1.3>1.4>1.6>1.8>1.7  Anticoagulation Plan: -INR Goal: 2-2.5 -ASA Dose: 325 mg d/c date of 12/18/20  Blood Products:  Intra-op 11/17/20: 4 FFP 2 PLT 2 PRBC 2 Cryo 380 cc of cell saver  Post op: 11/19/20>> 1 u/PRBC  Device: -NONE  Respiratory: Extubated 11/18/20  Infection:  Renal:  -BUN/CRT: 11/0.56>9/0.68>10/0.65> 9/0.54>9/0.51  Gtts: Milrinone 0.125 mcg/kg/min-off Lasix 6 mg/hr-off Amiodarone 30 mg/hr--off Heparin 500 u/hr  Adverse Events on VAD:  Patient Education: 1. Wife performed dressing change today for the 4th time with very few verbal cues from VAD coordinator. She did very well. She is  checked off to do dressing with bedside nurse. 2. Continuing education: >>12/13- Pt and caregiver taught how to change from PM to batteries. Pt/caregiver successfully demonstrated switching power sources. >>12/14- pt and caregiver educated on controller - including all soft keys, uses and internal backup battery. Pt demonstrated self test today. >>12/16- pt and wife state they feel "very comfortable" with power source changes, the controller, and performing self test. Pt reports he is tired and would like to rest today.  >>12/17-pt and wife educated on UBC and MPU including maintenance, charging batteries and battery rotation.   Plan/Recommendations:  1. Every other day dressing changes by VAD Coordinator or nurse champion. Next dressing change due 11/26/20. 2. Call VAD coordinator for any equipment issues. 3. Pt ok to transfer to 2C.   Carlton Adam RN VAD Coordinator  Office: [phone]  24/7 Pager: [phone]

## 2020-11-24 NOTE — Progress Notes (Addendum)
Patient ID: Kevin Underwood, male   DOB: 1960/12/17, 59 y.o.   MRN: [id number]     Advanced Heart Failure Rounding Note  PCP-Cardiologist: Nona Dell, MD   Subjective:    - 12/2 Had cath as noted below with intact revascularization. CO-OX 39%, started on milrinone.  - 12/3 Impella 5.5 placed - 12/6 Impella speed increased to P8. Started on lasix drip and given metolazone. Brisk diuresis noted. Negative 5 liters.  - 12/8 Swan removed, PICC placed pre-LVAD.  - 12/10 Impella removed, Heartmate 3 LVAD placed.  - 12/11 Extubated - 12/15 Ramp echo (difficult images): Speed left at 5500  POD #7.  Chest tubes removed yesterday. No events overnight.   OOB sitting up in chair. Feels well. No dyspnea. O2 sats 97% on RA.   CVP 6.   Co-ox 51%. BMP pending. BP stable. MAPs 80s   RHC/LHC 11/09/20  RA 20  PCWP 30-35 PA Sat 40% CO 3 CI 1.5.  1. Severe native coronary artery disease, similar to prior catheterization in 11/2019.  2. Widely patent LIMA-LAD, SVG-D1, and sequential SVG-OM3-rPDA. 3. Severely elevated left and right heart filling pressures. 4. Severely reduced cardiac output/index. 5. Successful placement of leave-in PA catheter via the right internal jugular vein.  LVAD Interrogation HM 3: Speed: 5500 Flow: 3.9 PI: 4.9 Power: 4.1.  3 PI events   LDH 356 => 340 => 297 => 340 => 260 => 235=>228  Objective:   Weight Range: 82 kg Body mass index is 25.94 kg/m.   Vital Signs:   Temp:  [97.7 F (36.5 C)-98.3 F (36.8 C)] 98.3 F (36.8 C) (12/17 0402) Pulse Rate:  [25-109] 79 (12/17 0700) Resp:  [13-26] 18 (12/17 0700) BP: (86-166)/(62-137) 166/137 (12/17 0700) SpO2:  [71 %-100 %] 85 % (12/17 0700) Weight:  [82 kg] 82 kg (12/17 0500) Last BM Date: 11/23/20  Weight change: Filed Weights   11/22/20 0500 11/23/20 0500 11/24/20 0500  Weight: 80.5 kg 80.8 kg 82 kg    Intake/Output:   Intake/Output Summary (Last 24 hours) at 11/24/2020 0710 Last data filed at  11/24/2020 0000 Gross per 24 hour  Intake 562.27 ml  Output 1015 ml  Net -452.73 ml      Physical Exam   CVP 6 General: well appearing, sitting up in chair. No respiratory difficulty  HEENT: Normal. Neck: Supple, 6 cm. Carotids OK.  Cardiac:  Mechanical heart sounds with LVAD hum present.   Lungs:  CTAB, no wheezing  Abdomen:  NT, ND, no HSM. No bruits or masses. +BS  LVAD exit site: Well-healed and incorporated. Dressing dry and intact. No erythema or drainage. Stabilization device present and accurately applied. Driveline dressing changed daily per sterile technique. Extremities:  Cool LEs and dry. No cyanosis, clubbing, rash, or edema.  Neuro:  Alert & oriented x 3. Cranial nerves grossly intact. Moves all 4 extremities w/o difficulty. Affect pleasant     Telemetry   Afib 90s   Personally reviewed   Labs    CBC Recent Labs    11/22/20 0322 11/23/20 0428  WBC 11.3* 10.9*  NEUTROABS 8.4*  --   HGB 10.9* 10.9*  HCT 32.5* 33.9*  MCV 87.4 87.6  PLT 238 256   Basic Metabolic Panel Recent Labs    02/40/97 0622 11/23/20 0428 11/24/20 0426  NA 135 132*  --   K 3.9 3.5  --   CL 94* 94*  --   CO2 30 26  --   GLUCOSE 98 166*  --  BUN 10 9  --   CREATININE 0.65 0.54*  --   CALCIUM 8.6* 8.3*  --   MG  --  1.9 1.8  PHOS  --  3.3 3.4   Liver Function Tests No results for input(s): AST, ALT, ALKPHOS, BILITOT, PROT, ALBUMIN in the last 72 hours. No results for input(s): LIPASE, AMYLASE in the last 72 hours. Cardiac Enzymes No results for input(s): CKTOTAL, CKMB, CKMBINDEX, TROPONINI in the last 72 hours.  BNP: BNP (last 3 results) Recent Labs    11/08/20 1344 11/18/20 0327 11/24/20 0045  BNP 910.0* 392.0* 252.0*    ProBNP (last 3 results) No results for input(s): PROBNP in the last 8760 hours.   D-Dimer No results for input(s): DDIMER in the last 72 hours. Hemoglobin A1C No results for input(s): HGBA1C in the last 72 hours. Fasting Lipid Panel No  results for input(s): CHOL, HDL, LDLCALC, TRIG, CHOLHDL, LDLDIRECT in the last 72 hours. Thyroid Function Tests No results for input(s): TSH, T4TOTAL, T3FREE, THYROIDAB in the last 72 hours.  Invalid input(s): FREET3  Other results:   Imaging    No results found.   Medications:     Scheduled Medications: . (feeding supplement) PROSource Plus  30 mL Oral BID BM  . acetaminophen  650 mg Oral Q6H  . amiodarone  200 mg Oral BID  . aspirin EC  325 mg Oral Daily   Or  . aspirin  324 mg Per Tube Daily  . bisacodyl  10 mg Oral Daily   Or  . bisacodyl  10 mg Rectal Daily  . Chlorhexidine Gluconate Cloth  6 each Topical Daily  . colchicine  0.6 mg Oral BID  . digoxin  0.125 mg Oral Daily  . docusate sodium  200 mg Oral Daily  . feeding supplement (GLUCERNA SHAKE)  237 mL Oral TID BM  . furosemide  40 mg Oral Daily  . insulin aspart  2-6 Units Subcutaneous TID AC & HS  . insulin detemir  10 Units Subcutaneous Q12H  . ipratropium-albuterol  3 mL Nebulization BID  . lidocaine  1 patch Transdermal Q24H  . mometasone-formoterol  2 puff Inhalation BID  . multivitamin with minerals  1 tablet Oral Daily  . pantoprazole  40 mg Oral QHS  . potassium chloride  20 mEq Oral Q4H  . QUEtiapine  25 mg Oral QHS  . rosuvastatin  40 mg Oral q1800  . sacubitril-valsartan  1 tablet Oral BID  . sodium chloride flush  10-40 mL Intracatheter Q12H  . sodium chloride flush  10-40 mL Intracatheter Q12H  . sodium chloride flush  3 mL Intravenous Q12H  . Warfarin - Pharmacist Dosing Inpatient   Does not apply q1600    Infusions: . sodium chloride 20 mL/hr at 11/22/20 1100  . sodium chloride    . sodium chloride Stopped (11/21/20 2144)  . lactated ringers 10 mL/hr at 11/17/20 0700  . lactated ringers    . lactated ringers Stopped (11/21/20 2142)    PRN Medications: sodium chloride, acetaminophen, albuterol, alum & mag hydroxide-simeth, aminocaproic acid, dextrose, haloperidol lactate,  ondansetron (ZOFRAN) IV, oxyCODONE, sodium chloride flush, sodium chloride flush, sodium chloride flush, traMADol    Patient Profile   Kevin Underwood a 59 y.o.malewith a history of systolic HF, 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 type 2 diabetes mellitus, COPD, and hypertension. Now s/p Heartmate  3 LVAD.   Assessment/Plan   1. Acute on chronic systolic CHF with cardiogenic shock: Echo 10/21 with EF 20-25%, mildly decreased RV function (persistently low). LHC/RHC this admission with patent grafts, low output. Suspect mixed ischemic/nonischemic cardiomyopathy (prior heavy ETOH and drugs as well as CAD).  No ETOH, drugs, smoking for about 1 year since CABG in 12/20. NYHA class IV at admission.  Had placement of Impella 5.5 initially, now s/p Heartmate 3 LVAD on 12/10.  Off milrinone. Co-ox marginal at 51%. CVP 6.   - Continue PO Lasix 40 mg  - Continue Entresto 24-26 BID - Continue digoxin 0.125  - Continue ASA 325 mg daily. INR lower at 1.7 today.   ? Restarting heparin gtt  2. Acute hypoxemic respiratory failure: Now extubated and stable.  3. CAD: S/p CABG 12/20.  LHC this admission with patent grafts, no target for intervention. - Continue ASA and statin.  4. Atrial fibrillation: Paroxysmal.  S/p Maze and LA appendage clip with CABG in 12/20.  ?Atrial flutter today. Rate controlled  - Continue PO amio 200 mg bid.   - Anticoagulation started as above. 5. Type 2 diabetes: A1c 10.4, poor control.  Continue insulin.  6. Prior ETOH, drugs: None x 1 year.  7. Anemia: Post-op, transfuse hgb < 8.  CBC pending   8. COPD: PFTs showed FEV1 1.79 (limited study due to LVAD).   Continue to mobilize w/ PT. Transfer to [facility] today if ok w/ CT surgery   Length of Stay: [address] PA-C  11/24/2020, 7:10 AM  Advanced Heart Failure Team Pager  [phone] (M-F; 7a - 4p)  Please contact [facility] for night-coverage after hours (4p -7a ) and weekends on [email or web address]  Patient seen with PA, agree with the above note.   Stable this morning, has already walked 2 laps.  Co-ox remains on the low side at 51% but he feels very good.  Off inotropes.  INR 1.7 today.   General: Well appearing this am. NAD.  HEENT: Normal. Neck: Supple, JVP 7-8 cm. Carotids OK.  Cardiac:  Mechanical heart sounds with LVAD hum present.  Lungs:  CTAB, normal effort.  Abdomen:  NT, ND, no HSM. No bruits or masses. +BS  LVAD exit site: Well-healed and incorporated. Dressing dry and intact. No erythema or drainage. Stabilization device present and accurately applied. Driveline dressing changed daily per sterile technique. Extremities:  Warm and dry. No cyanosis, clubbing, rash, or edema.  Neuro:  Alert & oriented x 3. Cranial nerves grossly intact. Moves all 4 extremities w/o difficulty. Affect pleasant    LVAD parameters stable, chest tubes out, off pressors.  Think he is ready for transfer to step down if ok with surgery.   INR 1.7 today, continue heparin gtt + warfarin. ASA 325 until INR > 2.   Continue po Lasix, needs BMET today.   Hopefully home early next week.   Marca Ancona 11/24/2020 7:55 AM

## 2020-11-24 NOTE — Progress Notes (Signed)
ANTICOAGULATION CONSULT NOTE   Pharmacy Consult for heparin Indication: atrial fibrillation, new Heart Mate III LVAD implant   No Known Allergies  Patient Measurements: Height: 5\' 10"  (177.8 cm) Weight: 82 kg (180 lb 12.4 oz) IBW/kg (Calculated) : 73 Heparin Dosing Weight: 82kg  Vital Signs: Temp: 97.8 F (36.6 C) (12/17 1441) Temp Source: Oral (12/17 1441) BP: 96/77 (12/17 1441) Pulse Rate: 95 (12/17 1441)  Labs: Recent Labs    11/22/20 0322 11/22/20 0323 11/22/20 0622 11/23/20 0428 11/24/20 0426 11/24/20 0756 11/24/20 1450  HGB 10.9*  --   --  10.9*  --  10.8*  --   HCT 32.5*  --   --  33.9*  --  33.7*  --   PLT 238  --   --  256  --  301  --   LABPROT 18.1*  --   --  20.4* 19.4*  --   --   INR 1.6*  --   --  1.8* 1.7*  --   --   HEPARINUNFRC  --  <0.10*  --  <0.10*  --   --  <0.10*  CREATININE  --   --  0.65 0.54*  --  0.51*  --     Estimated Creatinine Clearance: 102.7 mL/min (A) (by C-G formula based on SCr of 0.51 mg/dL (L)).  Assessment: 50 yoM admitted with CP and cardiogenic shock. Pt has hx AFib with no AC PTA, pharmacy to start IV heparin. Impella 5.5 placed 12/3 followed by HM3 LVAD 12/10. Warfarin started 12/12 post LVAD implant, low-dose heparin bridge ordered as well.  Heparin stopped yesterday with INR 1.8, now down to 1.7 this morning. CBC wnl, LDH stable. Pharmacy asked to restart heparin with low dose and no titrations earlier today  Hep lvl this afternoon is < 0.1  Goal of Therapy:  INR goal 2-2.5 Heparin level < 0.3 Monitor platelets by anticoagulation protocol: Yes   Plan:  Continue heparin 500 units/h Check heparin level in 6h - no titrations unless >0.3 Daily INR, CBC, heparin level  14/10, PharmD, BCPS, BCCCP Clinical Pharmacist [phone]  Please check AMION for all [facility] Pharmacy numbers  11/24/2020 4:48 PM

## 2020-11-24 NOTE — Plan of Care (Signed)
  Problem: Education: Goal: Knowledge of General Education information will improve Description: Including pain rating scale, medication(s)/side effects and non-pharmacologic comfort measures Outcome: Progressing   Problem: Health Behavior/Discharge Planning: Goal: Ability to manage health-related needs will improve Outcome: Progressing   Problem: Clinical Measurements: Goal: Ability to maintain clinical measurements within normal limits will improve Outcome: Progressing Goal: Will remain free from infection Outcome: Progressing Goal: Diagnostic test results will improve Outcome: Progressing Goal: Respiratory complications will improve Outcome: Progressing Goal: Cardiovascular complication will be avoided Outcome: Progressing   Problem: Activity: Goal: Risk for activity intolerance will decrease Outcome: Progressing   Problem: Nutrition: Goal: Adequate nutrition will be maintained Outcome: Progressing   Problem: Coping: Goal: Level of anxiety will decrease Outcome: Progressing   Problem: Elimination: Goal: Will not experience complications related to bowel motility Outcome: Progressing Goal: Will not experience complications related to urinary retention Outcome: Progressing   Problem: Pain Managment: Goal: General experience of comfort will improve Outcome: Progressing   Problem: Safety: Goal: Ability to remain free from injury will improve Outcome: Progressing   Problem: Skin Integrity: Goal: Risk for impaired skin integrity will decrease Outcome: Progressing   Problem: Education: Goal: Understanding of cardiac disease, CV risk reduction, and recovery process will improve Outcome: Progressing Goal: Individualized Educational Video(s) Outcome: Progressing   Problem: Activity: Goal: Ability to tolerate increased activity will improve Outcome: Progressing   Problem: Cardiac: Goal: Ability to achieve and maintain adequate cardiovascular perfusion will  improve Outcome: Progressing   Problem: Health Behavior/Discharge Planning: Goal: Ability to safely manage health-related needs after discharge will improve Outcome: Progressing   Problem: Education: Goal: Understanding of CV disease, CV risk reduction, and recovery process will improve Outcome: Progressing Goal: Individualized Educational Video(s) Outcome: Progressing   Problem: Activity: Goal: Ability to return to baseline activity level will improve Outcome: Progressing   Problem: Cardiovascular: Goal: Ability to achieve and maintain adequate cardiovascular perfusion will improve Outcome: Progressing Goal: Vascular access site(s) Level 0-1 will be maintained Outcome: Progressing   Problem: Health Behavior/Discharge Planning: Goal: Ability to safely manage health-related needs after discharge will improve Outcome: Progressing   Problem: Education: Goal: Knowledge of the prescribed therapeutic regimen will improve Outcome: Progressing   Problem: Activity: Goal: Risk for activity intolerance will decrease Outcome: Progressing   Problem: Cardiac: Goal: Ability to maintain an adequate cardiac output will improve Outcome: Progressing   Problem: Coping: Goal: Level of anxiety will decrease Outcome: Progressing   Problem: Fluid Volume: Goal: Risk for excess fluid volume will decrease Outcome: Progressing   Problem: Clinical Measurements: Goal: Ability to maintain clinical measurements within normal limits will improve Outcome: Progressing Goal: Will remain free from infection Outcome: Progressing   Problem: Respiratory: Goal: Will regain and/or maintain adequate ventilation Outcome: Progressing   

## 2020-11-24 NOTE — Progress Notes (Signed)
ANTICOAGULATION CONSULT NOTE   Pharmacy Consult for Warfarin + heparin Indication: atrial fibrillation, new Heart Mate III LVAD implant   No Known Allergies  Patient Measurements: Height: 5\' 10"  (177.8 cm) Weight: 82 kg (180 lb 12.4 oz) IBW/kg (Calculated) : 73 Heparin Dosing Weight: 82kg  Vital Signs: Temp: 97.8 F (36.6 C) (12/17 0750) Temp Source: Oral (12/17 0750) BP: 106/93 (12/17 0800) Pulse Rate: 69 (12/17 0800)  Labs: Recent Labs    11/22/20 0322 11/22/20 0323 11/22/20 0622 11/23/20 0428 11/24/20 0426 11/24/20 0756  HGB 10.9*  --   --  10.9*  --  10.8*  HCT 32.5*  --   --  33.9*  --  33.7*  PLT 238  --   --  256  --  301  LABPROT 18.1*  --   --  20.4* 19.4*  --   INR 1.6*  --   --  1.8* 1.7*  --   HEPARINUNFRC  --  <0.10*  --  <0.10*  --   --   CREATININE  --   --  0.65 0.54*  --  0.51*    Estimated Creatinine Clearance: 102.7 mL/min (A) (by C-G formula based on SCr of 0.51 mg/dL (L)).   Medical History: Past Medical History:  Diagnosis Date  . Arthritis   . CAD (coronary artery disease)    a. s/p CABG in 11/2019 with LIMA-LAD, SVG-OM1, SVG-PDA and SVG-D1  . CHF (congestive heart failure) (HCC)    a. EF < 20% by echo in 11/2019  . COPD (chronic obstructive pulmonary disease) (HCC)   . Essential hypertension   . PAF (paroxysmal atrial fibrillation) (HCC)   . Type 2 diabetes mellitus (HCC)      Assessment: 45 yoM admitted with CP and cardiogenic shock. Pt has hx AFib with no AC PTA, pharmacy to start IV heparin. Impella 5.5 placed 12/3 followed by HM3 LVAD 12/10. Warfarin started 12/12 post LVAD implant, low-dose heparin bridge ordered as well.  Heparin stopped yesterday with INR 1.8, now down to 1.7 this morning. CBC wnl, LDH stable. Pharmacy asked to restart heparin with low dose and no titrations.   Goal of Therapy:  INR goal 2-2.5 Heparin level < 0.3 Monitor platelets by anticoagulation protocol: Yes   Plan:  Warfarin 4mg  x1  tonight Restart heparin 500 units/h Check heparin level in 6h - no titrations unless >0.3 Daily INR, CBC, heparin level Will provide education prior to discharge    14/10, PharmD, BCPS, [facility] Clinical Pharmacist [phone] Please check AMION for all [facility] Pharmacy numbers 11/24/2020

## 2020-11-25 LAB — COOXEMETRY PANEL
Carboxyhemoglobin: 1.9 % — ABNORMAL HIGH (ref 0.5–1.5)
Carboxyhemoglobin: 2.1 % — ABNORMAL HIGH (ref 0.5–1.5)
Methemoglobin: 0.8 % (ref 0.0–1.5)
Methemoglobin: 0.9 % (ref 0.0–1.5)
O2 Saturation: 48.2 %
O2 Saturation: 69.7 %
Total hemoglobin: 10.1 g/dL — ABNORMAL LOW (ref 12.0–16.0)
Total hemoglobin: 10.6 g/dL — ABNORMAL LOW (ref 12.0–16.0)

## 2020-11-25 LAB — GLUCOSE, CAPILLARY
Glucose-Capillary: 90 mg/dL (ref 70–99)
Glucose-Capillary: 166 mg/dL — ABNORMAL HIGH (ref 70–99)
Glucose-Capillary: 192 mg/dL — ABNORMAL HIGH (ref 70–99)
Glucose-Capillary: 194 mg/dL — ABNORMAL HIGH (ref 70–99)
Glucose-Capillary: 105 mg/dL — ABNORMAL HIGH (ref 70–99)

## 2020-11-25 LAB — BASIC METABOLIC PANEL
Anion gap: 9 (ref 5–15)
BUN: 9 mg/dL (ref 6–20)
CO2: 27 mmol/L (ref 22–32)
Calcium: 8.4 mg/dL — ABNORMAL LOW (ref 8.9–10.3)
Chloride: 96 mmol/L — ABNORMAL LOW (ref 98–111)
Creatinine, Ser: 0.61 mg/dL (ref 0.61–1.24)
GFR, Estimated: >60 mL/min (ref >60)
Glucose, Bld: 140 mg/dL — ABNORMAL HIGH (ref 70–99)
Potassium: 3.7 mmol/L (ref 3.5–5.1)
Sodium: 132 mmol/L — ABNORMAL LOW (ref 135–145)

## 2020-11-25 LAB — CBC
HCT: 31.2 % — ABNORMAL LOW (ref 39.0–52.0)
Hemoglobin: 10.4 g/dL — ABNORMAL LOW (ref 13.0–17.0)
MCH: 28.7 pg (ref 26.0–34.0)
MCHC: 33.3 g/dL (ref 30.0–36.0)
MCV: 86 fL (ref 80.0–100.0)
Platelets: 294 10*3/uL (ref 150–400)
RBC: 3.63 MIL/uL — ABNORMAL LOW (ref 4.22–5.81)
RDW: 14.7 % (ref 11.5–15.5)
WBC: 11.4 10*3/uL — ABNORMAL HIGH (ref 4.0–10.5)
nRBC: 0 % (ref 0.0–0.2)

## 2020-11-25 LAB — LACTATE DEHYDROGENASE: LDH: 204 U/L — ABNORMAL HIGH (ref 98–192)

## 2020-11-25 LAB — HEPARIN LEVEL (UNFRACTIONATED): Heparin Unfractionated: <0.1 IU/mL — ABNORMAL LOW (ref 0.30–0.70)

## 2020-11-25 LAB — PROTIME-INR
INR: 1.8 — ABNORMAL HIGH (ref 0.8–1.2)
Prothrombin Time: 20.3 seconds — ABNORMAL HIGH (ref 11.4–15.2)

## 2020-11-25 MED ORDER — WARFARIN SODIUM 4 MG PO TABS
4.0000 mg | ORAL_TABLET | Freq: Once | ORAL | Status: AC
  Administered 2020-11-25: 17:00:00 4 mg via ORAL
  Filled 2020-11-25: qty 1

## 2020-11-25 MED ORDER — POTASSIUM CHLORIDE CRYS ER 20 MEQ PO TBCR
40.0000 meq | EXTENDED_RELEASE_TABLET | Freq: Once | ORAL | Status: AC
  Administered 2020-11-25: 09:00:00 40 meq via ORAL
  Filled 2020-11-25: qty 2

## 2020-11-25 NOTE — Progress Notes (Signed)
ANTICOAGULATION CONSULT NOTE   Pharmacy Consult for Warfarin + heparin Indication: atrial fibrillation, new Heart Mate III LVAD implant   No Known Allergies  Patient Measurements: Height: 5\' 10"  (177.8 cm) Weight: 79.4 kg (175 lb 0.7 oz) IBW/kg (Calculated) : 73 Heparin Dosing Weight: 82kg  Vital Signs: Temp: 97.6 F (36.4 C) (12/18 1213) Temp Source: Oral (12/18 1213) BP: 102/89 (12/18 1213) Pulse Rate: 83 (12/18 0405)  Labs: Recent Labs    11/23/20 0428 11/24/20 0426 11/24/20 0756 11/24/20 1450 11/25/20 0410  HGB 10.9*  --  10.8*  --  10.4*  HCT 33.9*  --  33.7*  --  31.2*  PLT 256  --  301  --  294  LABPROT 20.4* 19.4*  --   --  20.3*  INR 1.8* 1.7*  --   --  1.8*  HEPARINUNFRC <0.10*  --   --  <0.10* <0.10*  CREATININE 0.54*  --  0.51*  --  0.61    Estimated Creatinine Clearance: 102.7 mL/min (by C-G formula based on SCr of 0.61 mg/dL).   Medical History: Past Medical History:  Diagnosis Date  . Arthritis   . CAD (coronary artery disease)    a. s/p CABG in 11/2019 with LIMA-LAD, SVG-OM1, SVG-PDA and SVG-D1  . CHF (congestive heart failure) (HCC)    a. EF < 20% by echo in 11/2019  . COPD (chronic obstructive pulmonary disease) (HCC)   . Essential hypertension   . PAF (paroxysmal atrial fibrillation) (HCC)   . Type 2 diabetes mellitus (HCC)      Assessment: 23 yoM admitted with CP and cardiogenic shock. Pt has hx AFib with no AC PTA, pharmacy to start IV heparin. Impella 5.5 placed 12/3 followed by HM3 LVAD 12/10. Warfarin started 12/12 post LVAD implant, low-dose heparin bridge ordered as well.  INR subtherapeutic but improved to 1.8 today. CBC wnl, LDH stable. No overt bleeding noted. Heparin was restarted yesterday with INR down to 1.7. HL undetectable as expected.   Goal of Therapy:  INR goal 2-2.5 Heparin level < 0.3 Monitor platelets by anticoagulation protocol: Yes   Plan:  Warfarin 4mg  x1 tonight Continue heparin 500 units/h Check daily  heparin level - no titrations unless >0.3 Daily INR, CBC, s/sx bleeding Will provide education prior to discharge  14/10, PharmD, BCPS PGY2 Cardiology Pharmacy Resident Phone: [phone] 11/25/2020  2:45 PM  Please check [email or web address] for unit-specific pharmacy phone numbers.

## 2020-11-25 NOTE — Progress Notes (Signed)
      [address]       [place] [address]             [phone]        8 Days Post-Op Procedure(s) (LRB): REDO STERNOTOMY (N/A) INSERTION OF IMPLANTABLE LEFT VENTRICULAR ASSIST DEVICE - HM3 (N/A) TRANSESOPHAGEAL ECHOCARDIOGRAM (TEE) (N/A) Subjective: Feels wellr  Objective: Vital signs in last 24 hours: Temp:  [97.8 F (36.6 C)-98.8 F (37.1 C)] 98.5 F (36.9 C) (12/18 0405) Pulse Rate:  [46-98] 83 (12/18 0405) Cardiac Rhythm: Normal sinus rhythm (12/18 0701) Resp:  [17-24] 19 (12/18 0405) BP: (85-109)/(55-88) 90/55 (12/18 0405) SpO2:  [92 %-99 %] 95 % (12/18 0405) Weight:  [79.4 kg] 79.4 kg (12/18 0420)  Hemodynamic parameters for last 24 hours: CVP:  [0 mmHg-10 mmHg] 10 mmHg  Intake/Output from previous day: 12/17 0701 - 12/18 0700 In: 68.9 [I.V.:18.9; IV Piggyback:50] Out: 1000 [Urine:1000] Intake/Output this shift: No intake/output data recorded.  General appearance: alert and cooperative Neurologic: intact Heart: regular rate and rhythm, S1, S2 normal, no murmur, click, rub or gallop Lungs: clear to auscultation bilaterally Abdomen: soft, non-tender; bowel sounds normal; no masses,  no organomegaly Extremities: edema mild Wound: intact  Lab Results: Recent Labs    11/24/20 0756 11/25/20 0410  WBC 15.3* 11.4*  HGB 10.8* 10.4*  HCT 33.7* 31.2*  PLT 301 294   BMET:  Recent Labs    11/24/20 0756 11/25/20 0410  NA 132* 132*  K 3.8 3.7  CL 98 96*  CO2 26 27  GLUCOSE 192* 140*  BUN 9 9  CREATININE 0.51* 0.61  CALCIUM 8.2* 8.4*    PT/INR:  Recent Labs    11/25/20 0410  LABPROT 20.3*  INR 1.8*   ABG    Component Value Date/Time   PHART 7.508 (H) 11/20/2020 0438   HCO3 27.9 11/20/2020 0438   TCO2 29 11/20/2020 0438   ACIDBASEDEF 6.0 (H) 11/24/2019 1718   O2SAT 48.2 11/25/2020 0410   CBG (last 3)  Recent Labs    11/24/20 1109 11/24/20 2204 11/25/20 0637  GLUCAP 142* 173* 90    Assessment/Plan: S/P  Procedure(s) (LRB): REDO STERNOTOMY (N/A) INSERTION OF IMPLANTABLE LEFT VENTRICULAR ASSIST DEVICE - HM3 (N/A) TRANSESOPHAGEAL ECHOCARDIOGRAM (TEE) (N/A) Patient wants to go home monday  Continue anticoagulation PT/OT   LOS: 17 days    Delight Ovens 11/25/2020

## 2020-11-25 NOTE — Progress Notes (Signed)
Patient ID: Kevin Underwood, male   DOB: 1961/07/30, 59 y.o.   MRN: [id number]     Advanced Heart Failure Rounding Note  PCP-Cardiologist: Nona Dell, MD   Subjective:    - 12/2 Had cath as noted below with intact revascularization. CO-OX 39%, started on milrinone.  - 12/3 Impella 5.5 placed - 12/6 Impella speed increased to P8. Started on lasix drip and given metolazone. Brisk diuresis noted. Negative 5 liters.  - 12/8 Swan removed, PICC placed pre-LVAD.  - 12/10 Impella removed, Heartmate 3 LVAD placed.  - 12/11 Extubated - 12/15 Ramp echo (difficult images): Speed left at 5500  POD #8 VAD.   Feels ok. Back sore. Co-ox down to 48% No SOB, orthopnea or PND.  CVP 9. Urine dark but LDH ok at 204    LVAD Interrogation HM 3: Speed: 5500 Flow: 4.2 PI: 3/0 Power: 4.0  Frequent PI events  VAD interrogated personally. Parameters stable.    Objective:   Weight Range: 79.4 kg Body mass index is 25.12 kg/m.   Vital Signs:   Temp:  [97.6 F (36.4 C)-98.8 F (37.1 C)] 97.6 F (36.4 C) (12/18 1213) Pulse Rate:  [75-98] 83 (12/18 0405) Resp:  [17-24] 19 (12/18 1213) BP: (85-109)/(55-89) 102/89 (12/18 1213) SpO2:  [92 %-98 %] 94 % (12/18 1010) FiO2 (%):  [21 %] 21 % (12/18 1010) Weight:  [79.4 kg] 79.4 kg (12/18 0420) Last BM Date: 11/23/20  Weight change: Filed Weights   11/23/20 0500 11/24/20 0500 11/25/20 0420  Weight: 80.8 kg 82 kg 79.4 kg    Intake/Output:   Intake/Output Summary (Last 24 hours) at 11/25/2020 1223 Last data filed at 11/25/2020 0800 Gross per 24 hour  Intake 120 ml  Output 600 ml  Net -480 ml      Physical Exam   General:  Sitting in chair NAD.  HEENT: normal  Neck: supple. JVP not elevated.  Carotids 2+ bilat; no bruits. No lymphadenopathy or thryomegaly appreciated. Cor: LVAD hum. Sternal wound ok   Lungs: Clear. Abdomen: obese soft, nontender, non-distended. No hepatosplenomegaly. No bruits or masses. Good bowel sounds. Driveline site  clean. Anchor in place.  Extremities: no cyanosis, clubbing, rash. Warm no edema  Neuro: alert & oriented x 3. No focal deficits. Moves all 4 without problem    Telemetry   NSR 80-90s Personally reviewed  Labs    CBC Recent Labs    11/24/20 0756 11/25/20 0410  WBC 15.3* 11.4*  HGB 10.8* 10.4*  HCT 33.7* 31.2*  MCV 87.8 86.0  PLT 301 294   Basic Metabolic Panel Recent Labs    48/54/62 0428 11/24/20 0426 11/24/20 0756 11/25/20 0410  NA 132*  --  132* 132*  K 3.5  --  3.8 3.7  CL 94*  --  98 96*  CO2 26  --  26 27  GLUCOSE 166*  --  192* 140*  BUN 9  --  9 9  CREATININE 0.54*  --  0.51* 0.61  CALCIUM 8.3*  --  8.2* 8.4*  MG 1.9 1.8  --   --   PHOS 3.3 3.4  --   --    Liver Function Tests No results for input(s): AST, ALT, ALKPHOS, BILITOT, PROT, ALBUMIN in the last 72 hours. No results for input(s): LIPASE, AMYLASE in the last 72 hours. Cardiac Enzymes No results for input(s): CKTOTAL, CKMB, CKMBINDEX, TROPONINI in the last 72 hours.  BNP: BNP (last 3 results) Recent Labs    11/08/20 1344  11/18/20 0327 11/24/20 0045  BNP 910.0* 392.0* 252.0*    ProBNP (last 3 results) No results for input(s): PROBNP in the last 8760 hours.   D-Dimer No results for input(s): DDIMER in the last 72 hours. Hemoglobin A1C No results for input(s): HGBA1C in the last 72 hours. Fasting Lipid Panel No results for input(s): CHOL, HDL, LDLCALC, TRIG, CHOLHDL, LDLDIRECT in the last 72 hours. Thyroid Function Tests No results for input(s): TSH, T4TOTAL, T3FREE, THYROIDAB in the last 72 hours.  Invalid input(s): FREET3  Other results:   Imaging    No results found.   Medications:     Scheduled Medications: . amiodarone  200 mg Oral BID  . aspirin EC  325 mg Oral Daily   Or  . aspirin  324 mg Per Tube Daily  . bisacodyl  10 mg Oral Daily   Or  . bisacodyl  10 mg Rectal Daily  . Chlorhexidine Gluconate Cloth  6 each Topical Daily  . colchicine  0.6 mg Oral BID   . digoxin  0.125 mg Oral Daily  . docusate sodium  200 mg Oral Daily  . feeding supplement (GLUCERNA SHAKE)  237 mL Oral TID BM  . furosemide  40 mg Oral Daily  . insulin aspart  2-6 Units Subcutaneous TID AC & HS  . insulin detemir  10 Units Subcutaneous Q12H  . ipratropium-albuterol  3 mL Nebulization BID  . lidocaine  1 patch Transdermal Q24H  . mometasone-formoterol  2 puff Inhalation BID  . multivitamin with minerals  1 tablet Oral Daily  . pantoprazole  40 mg Oral QHS  . QUEtiapine  25 mg Oral QHS  . rosuvastatin  40 mg Oral q1800  . sacubitril-valsartan  1 tablet Oral BID  . sodium chloride flush  10-40 mL Intracatheter Q12H  . sodium chloride flush  3 mL Intravenous Q12H  . Warfarin - Pharmacist Dosing Inpatient   Does not apply q1600    Infusions: . sodium chloride    . sodium chloride Stopped (11/21/20 2144)  . heparin 500 Units/hr (11/24/20 0813)  . lactated ringers 10 mL/hr at 11/17/20 0700  . lactated ringers    . lactated ringers Stopped (11/21/20 2142)    PRN Medications: acetaminophen, albuterol, alum & mag hydroxide-simeth, aminocaproic acid, dextrose, haloperidol lactate, ondansetron (ZOFRAN) IV, oxyCODONE, sodium chloride flush, sodium chloride flush, sodium chloride flush, traMADol    Patient Profile   Kevin Underwood a 59 y.o.malewith a history of systolic HF, 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 type 2 diabetes mellitus, COPD, and hypertension. Now s/p Heartmate 3 LVAD.   Assessment/Plan   1. Acute on chronic systolic CHF with cardiogenic shock: Echo 10/21 with EF 20-25%, mildly decreased RV function (persistently low). LHC/RHC this admission with patent grafts, low output. Suspect mixed ischemic/nonischemic cardiomyopathy (prior heavy ETOH and drugs as well as CAD).  No ETOH, drugs, smoking for about 1 year  since CABG in 12/20. NYHA class IV at admission.  Had placement of Impella 5.5 initially, now s/p Heartmate 3 LVAD on 12/10.  Off milrinone. Co-ox 51%-> 48%. CVP 8-9  - Will repeat co-ox. If dropping may need to restart milrinone +/- sildenafil - Volume status ok. Continue PO Lasix 40 mg  - Continue Entresto 24-26 BID - Continue digoxin 0.125  - Continue ASA 325 mg daily. INR lower at 1.8 today. Discussed dosing with  PharmD personally. - VAD interrogated personally. Frequent PI events. Parameters otherwise  stable. LDH 204  2. Acute hypoxemic respiratory failure: Now extubated and stable.  3. CAD: S/p CABG 12/20.  LHC this admission with patent grafts, no target for intervention. - Continue ASA and statin.  - No s/s angina 4. Atrial fibrillation: Paroxysmal.  S/p Maze and LA appendage clip with CABG in 12/20.  Back in NSR today - Continue PO amio 200 mg bid.   - INR 1.8  5. Type 2 diabetes: A1c 10.4, poor control.  Continue insulin.  6. Prior ETOH, drugs: None x 1 year.  7. Anemia: Post-op, transfuse hgb < 8.  CBC pending   8. COPD: PFTs showed FEV1 1.79 (limited study due to LVAD).    Length of Stay: 17  Arvilla Meres, MD  11/25/2020, 12:23 PM  Advanced Heart Failure Team Pager [phone] (M-F; 7a - 4p)  Please contact [facility] for night-coverage after hours (4p -7a ) and weekends on [email or web address]

## 2020-11-26 LAB — BASIC METABOLIC PANEL
Anion gap: 9 (ref 5–15)
BUN: 14 mg/dL (ref 6–20)
CO2: 27 mmol/L (ref 22–32)
Calcium: 8.7 mg/dL — ABNORMAL LOW (ref 8.9–10.3)
Chloride: 98 mmol/L (ref 98–111)
Creatinine, Ser: 0.58 mg/dL — ABNORMAL LOW (ref 0.61–1.24)
GFR, Estimated: >60 mL/min (ref >60)
Glucose, Bld: 100 mg/dL — ABNORMAL HIGH (ref 70–99)
Potassium: 3.9 mmol/L (ref 3.5–5.1)
Sodium: 134 mmol/L — ABNORMAL LOW (ref 135–145)

## 2020-11-26 LAB — CBC
HCT: 32.2 % — ABNORMAL LOW (ref 39.0–52.0)
Hemoglobin: 10.2 g/dL — ABNORMAL LOW (ref 13.0–17.0)
MCH: 27.7 pg (ref 26.0–34.0)
MCHC: 31.7 g/dL (ref 30.0–36.0)
MCV: 87.5 fL (ref 80.0–100.0)
Platelets: 345 10*3/uL (ref 150–400)
RBC: 3.68 MIL/uL — ABNORMAL LOW (ref 4.22–5.81)
RDW: 14.6 % (ref 11.5–15.5)
WBC: 12.3 10*3/uL — ABNORMAL HIGH (ref 4.0–10.5)
nRBC: 0 % (ref 0.0–0.2)

## 2020-11-26 LAB — GLUCOSE, CAPILLARY
Glucose-Capillary: 84 mg/dL (ref 70–99)
Glucose-Capillary: 149 mg/dL — ABNORMAL HIGH (ref 70–99)
Glucose-Capillary: 166 mg/dL — ABNORMAL HIGH (ref 70–99)
Glucose-Capillary: 140 mg/dL — ABNORMAL HIGH (ref 70–99)

## 2020-11-26 LAB — COOXEMETRY PANEL
Carboxyhemoglobin: 2 % — ABNORMAL HIGH (ref 0.5–1.5)
Methemoglobin: 0.8 % (ref 0.0–1.5)
O2 Saturation: 66 %
Total hemoglobin: 10.4 g/dL — ABNORMAL LOW (ref 12.0–16.0)

## 2020-11-26 LAB — MAGNESIUM: Magnesium: 2 mg/dL (ref 1.7–2.4)

## 2020-11-26 LAB — PROTIME-INR
INR: 2.1 — ABNORMAL HIGH (ref 0.8–1.2)
Prothrombin Time: 22.4 seconds — ABNORMAL HIGH (ref 11.4–15.2)

## 2020-11-26 LAB — LACTATE DEHYDROGENASE: LDH: 205 U/L — ABNORMAL HIGH (ref 98–192)

## 2020-11-26 MED ORDER — POTASSIUM CHLORIDE CRYS ER 20 MEQ PO TBCR
20.0000 meq | EXTENDED_RELEASE_TABLET | Freq: Once | ORAL | Status: AC
  Administered 2020-11-26: 17:00:00 20 meq via ORAL
  Filled 2020-11-26: qty 1

## 2020-11-26 MED ORDER — ASPIRIN EC 81 MG PO TBEC
81.0000 mg | DELAYED_RELEASE_TABLET | Freq: Every day | ORAL | Status: DC
  Administered 2020-11-27 – 2020-11-28 (×2): 81 mg via ORAL
  Filled 2020-11-26 (×2): qty 1

## 2020-11-26 NOTE — Plan of Care (Signed)
  Problem: Education: Goal: Knowledge of General Education information will improve Description: Including pain rating scale, medication(s)/side effects and non-pharmacologic comfort measures Outcome: Progressing   Problem: Health Behavior/Discharge Planning: Goal: Ability to manage health-related needs will improve Outcome: Progressing   Problem: Clinical Measurements: Goal: Ability to maintain clinical measurements within normal limits will improve Outcome: Progressing Goal: Will remain free from infection Outcome: Progressing Goal: Diagnostic test results will improve Outcome: Progressing Goal: Respiratory complications will improve Outcome: Progressing Goal: Cardiovascular complication will be avoided Outcome: Progressing   Problem: Activity: Goal: Risk for activity intolerance will decrease Outcome: Progressing   Problem: Nutrition: Goal: Adequate nutrition will be maintained Outcome: Progressing   Problem: Coping: Goal: Level of anxiety will decrease Outcome: Progressing   Problem: Elimination: Goal: Will not experience complications related to bowel motility Outcome: Progressing Goal: Will not experience complications related to urinary retention Outcome: Progressing   Problem: Pain Managment: Goal: General experience of comfort will improve Outcome: Progressing   Problem: Safety: Goal: Ability to remain free from injury will improve Outcome: Progressing   Problem: Skin Integrity: Goal: Risk for impaired skin integrity will decrease Outcome: Progressing   Problem: Education: Goal: Understanding of cardiac disease, CV risk reduction, and recovery process will improve Outcome: Progressing Goal: Individualized Educational Video(s) Outcome: Progressing   Problem: Activity: Goal: Ability to tolerate increased activity will improve Outcome: Progressing   Problem: Cardiac: Goal: Ability to achieve and maintain adequate cardiovascular perfusion will  improve Outcome: Progressing   Problem: Health Behavior/Discharge Planning: Goal: Ability to safely manage health-related needs after discharge will improve Outcome: Progressing   Problem: Education: Goal: Understanding of CV disease, CV risk reduction, and recovery process will improve Outcome: Progressing Goal: Individualized Educational Video(s) Outcome: Progressing   Problem: Activity: Goal: Ability to return to baseline activity level will improve Outcome: Progressing   Problem: Cardiovascular: Goal: Ability to achieve and maintain adequate cardiovascular perfusion will improve Outcome: Progressing Goal: Vascular access site(s) Level 0-1 will be maintained Outcome: Progressing   Problem: Health Behavior/Discharge Planning: Goal: Ability to safely manage health-related needs after discharge will improve Outcome: Progressing   Problem: Education: Goal: Knowledge of the prescribed therapeutic regimen will improve Outcome: Progressing   Problem: Activity: Goal: Risk for activity intolerance will decrease Outcome: Progressing   Problem: Cardiac: Goal: Ability to maintain an adequate cardiac output will improve Outcome: Progressing   Problem: Coping: Goal: Level of anxiety will decrease Outcome: Progressing   Problem: Fluid Volume: Goal: Risk for excess fluid volume will decrease Outcome: Progressing   Problem: Clinical Measurements: Goal: Ability to maintain clinical measurements within normal limits will improve Outcome: Progressing Goal: Will remain free from infection Outcome: Progressing   Problem: Respiratory: Goal: Will regain and/or maintain adequate ventilation Outcome: Progressing

## 2020-11-26 NOTE — Progress Notes (Signed)
Patient ID: Kevin Underwood, male   DOB: 01-08-1961, 59 y.o.   MRN: [id number]     Advanced Heart Failure Rounding Note  PCP-Cardiologist: Nona Dell, MD   Subjective:    - 12/2 Had cath as noted below with intact revascularization. CO-OX 39%, started on milrinone.  - 12/3 Impella 5.5 placed - 12/6 Impella speed increased to P8. Started on lasix drip and given metolazone. Brisk diuresis noted. Negative 5 liters.  - 12/8 Swan removed, PICC placed pre-LVAD.  - 12/10 Impella removed, Heartmate 3 LVAD placed.  - 12/11 Extubated - 12/15 Ramp echo (difficult images): Speed left at 5500  POD #9 VAD.   Feels good to day. Eager to go home.  Co-ox 66%  No SOB, orthopnea or PND. No problems with DL. Caregiver did dressing change independently today.   LVAD Interrogation HM 3: Speed: 5500 Flow: 4.0  PI: 5.0  Power: 4.0  Continues with frequent PI events VAD interrogated personally. Parameters stable.  Objective:   Weight Range: 79.9 kg Body mass index is 25.27 kg/m.   Vital Signs:   Temp:  [97.6 F (36.4 C)-98.2 F (36.8 C)] 98.2 F (36.8 C) (12/19 0711) Pulse Rate:  [87-93] 90 (12/19 0711) Resp:  [15-20] 15 (12/19 0500) BP: (89-102)/(76-89) 100/84 (12/19 0500) SpO2:  [94 %-98 %] 96 % (12/19 0826) FiO2 (%):  [21 %] 21 % (12/19 0826) Weight:  [79.9 kg] 79.9 kg (12/19 0500) Last BM Date: 11/23/20  Weight change: Filed Weights   11/24/20 0500 11/25/20 0420 11/26/20 0500  Weight: 82 kg 79.4 kg 79.9 kg    Intake/Output:   Intake/Output Summary (Last 24 hours) at 11/26/2020 0923 Last data filed at 11/26/2020 0800 Gross per 24 hour  Intake 744.5 ml  Output 500 ml  Net 244.5 ml      Physical Exam   General:  Sitting in chair. No resp difficulty HEENT: normal  Neck: supple. JVP not elevated.  Carotids 2+ bilat; no bruits. No lymphadenopathy or thryomegaly appreciated. Cor: sternal wound ok  LVAD hum.  Pacing wires still in  Lungs: Clear. Abdomen: obese soft,  nontender, non-distended. No hepatosplenomegaly. No bruits or masses. Good bowel sounds. Driveline site clean. Anchor in place.  Extremities: no cyanosis, clubbing, rash. Warm no edema  Neuro: alert & oriented x 3. No focal deficits. Moves all 4 without problem  erves grossly intact. moves all 4 extremities w/o difficulty. Affect pleasant    Telemetry   NSR 80-90s Personally reviewed  Labs    CBC Recent Labs    11/25/20 0410 11/26/20 0540  WBC 11.4* 12.3*  HGB 10.4* 10.2*  HCT 31.2* 32.2*  MCV 86.0 87.5  PLT 294 345   Basic Metabolic Panel Recent Labs    67/89/38 0426 11/24/20 0756 11/25/20 0410 11/26/20 0540  NA  --    < > 132* 134*  K  --    < > 3.7 3.9  CL  --    < > 96* 98  CO2  --    < > 27 27  GLUCOSE  --    < > 140* 100*  BUN  --    < > 9 14  CREATININE  --    < > 0.61 0.58*  CALCIUM  --    < > 8.4* 8.7*  MG 1.8  --   --  2.0  PHOS 3.4  --   --   --    < > = values in this interval not displayed.  Liver Function Tests No results for input(s): AST, ALT, ALKPHOS, BILITOT, PROT, ALBUMIN in the last 72 hours. No results for input(s): LIPASE, AMYLASE in the last 72 hours. Cardiac Enzymes No results for input(s): CKTOTAL, CKMB, CKMBINDEX, TROPONINI in the last 72 hours.  BNP: BNP (last 3 results) Recent Labs    11/08/20 1344 11/18/20 0327 11/24/20 0045  BNP 910.0* 392.0* 252.0*    ProBNP (last 3 results) No results for input(s): PROBNP in the last 8760 hours.   D-Dimer No results for input(s): DDIMER in the last 72 hours. Hemoglobin A1C No results for input(s): HGBA1C in the last 72 hours. Fasting Lipid Panel No results for input(s): CHOL, HDL, LDLCALC, TRIG, CHOLHDL, LDLDIRECT in the last 72 hours. Thyroid Function Tests No results for input(s): TSH, T4TOTAL, T3FREE, THYROIDAB in the last 72 hours.  Invalid input(s): FREET3  Other results:   Imaging    No results found.   Medications:     Scheduled Medications: . amiodarone   200 mg Oral BID  . aspirin EC  325 mg Oral Daily   Or  . aspirin  324 mg Per Tube Daily  . bisacodyl  10 mg Oral Daily   Or  . bisacodyl  10 mg Rectal Daily  . Chlorhexidine Gluconate Cloth  6 each Topical Daily  . colchicine  0.6 mg Oral BID  . digoxin  0.125 mg Oral Daily  . docusate sodium  200 mg Oral Daily  . feeding supplement (GLUCERNA SHAKE)  237 mL Oral TID BM  . furosemide  40 mg Oral Daily  . insulin aspart  2-6 Units Subcutaneous TID AC & HS  . insulin detemir  10 Units Subcutaneous Q12H  . ipratropium-albuterol  3 mL Nebulization BID  . lidocaine  1 patch Transdermal Q24H  . mometasone-formoterol  2 puff Inhalation BID  . multivitamin with minerals  1 tablet Oral Daily  . pantoprazole  40 mg Oral QHS  . QUEtiapine  25 mg Oral QHS  . rosuvastatin  40 mg Oral q1800  . sacubitril-valsartan  1 tablet Oral BID  . sodium chloride flush  10-40 mL Intracatheter Q12H  . sodium chloride flush  3 mL Intravenous Q12H  . Warfarin - Pharmacist Dosing Inpatient   Does not apply q1600    Infusions: . sodium chloride    . sodium chloride Stopped (11/21/20 2144)  . lactated ringers 10 mL/hr at 11/17/20 0700  . lactated ringers    . lactated ringers Stopped (11/21/20 2142)    PRN Medications: acetaminophen, albuterol, alum & mag hydroxide-simeth, aminocaproic acid, dextrose, haloperidol lactate, ondansetron (ZOFRAN) IV, oxyCODONE, sodium chloride flush, sodium chloride flush, sodium chloride flush, traMADol    Patient Profile   Kevin Underwood a 59 y.o.malewith a history of systolic HF, 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 type 2 diabetes mellitus, COPD, and hypertension. Now s/p Heartmate 3 LVAD.   Assessment/Plan   1. Acute on chronic systolic CHF with cardiogenic shock: Echo 10/21 with EF 20-25%, mildly decreased RV function  (persistently low). LHC/RHC this admission with patent grafts, low output. Suspect mixed ischemic/nonischemic cardiomyopathy (prior heavy ETOH and drugs as well as CAD).  No ETOH, drugs, smoking for about 1 year since CABG in 12/20. NYHA class IV at admission.  Had placement of Impella 5.5 initially, now s/p Heartmate 3 LVAD on 12/10.  Off milrinone. Co-ox improved to  66% - Volume status ok. Continue PO Lasix 40 mg. CVP 7 - Continue Entresto 24-26 BID - Continue digoxin 0.125  - Continue ASA 325 mg daily. INR lower at 1.8 today. Discussed dosing with PharmD personally. - VAD interrogated personally.Frequent PI events. Parameters otherwise stable. LDH 205 - INR 2.1 Discussed dosing with PharmD personally.  Decrease ASA 325 -> 81 - Patient still has pacing wires in. I d/w Dr. Vickey Sages. Will hold warfarin tonight. Pull in am.  2. Acute hypoxemic respiratory failure: Now extubated and stable.  3. CAD: S/p CABG 12/20.  LHC this admission with patent grafts, no target for intervention. - Continue ASA and statin.  - No s/s angina 4. Atrial fibrillation: Paroxysmal.  S/p Maze and LA appendage clip with CABG in 12/20.  Back in NSR today - Continue PO amio 200 mg bid.   - INR 1.8  5. Type 2 diabetes: A1c 10.4, poor control.  Continue insulin.  6. Prior ETOH, drugs: None x 1 year.  7. Anemia: Post-op, transfuse hgb < 8.  CBC pending   8. COPD: PFTs showed FEV1 1.79 (limited study due to LVAD).    Length of Stay: 29  Arvilla Meres, MD  11/26/2020, 9:23 AM  Advanced Heart Failure Team Pager [phone] (M-F; 7a - 4p)  Please contact [facility] for night-coverage after hours (4p -7a ) and weekends on [email or web address]

## 2020-11-26 NOTE — Progress Notes (Signed)
ANTICOAGULATION CONSULT NOTE   Pharmacy Consult for Warfarin  Indication: atrial fibrillation, new Heart Mate III LVAD implant   No Known Allergies  Patient Measurements: Height: 5\' 10"  (177.8 cm) Weight: 79.9 kg (176 lb 2.4 oz) IBW/kg (Calculated) : 73 Heparin Dosing Weight: 82kg  Vital Signs: Temp: 98.2 F (36.8 C) (12/19 1143) Temp Source: Oral (12/19 1143) BP: 100/84 (12/19 0500) Pulse Rate: 90 (12/19 1143)  Labs: Recent Labs    11/24/20 0426 11/24/20 0756 11/24/20 0756 11/24/20 1450 11/25/20 0410 11/26/20 0540  HGB  --  10.8*   < >  --  10.4* 10.2*  HCT  --  33.7*  --   --  31.2* 32.2*  PLT  --  301  --   --  294 345  LABPROT 19.4*  --   --   --  20.3* 22.4*  INR 1.7*  --   --   --  1.8* 2.1*  HEPARINUNFRC  --   --   --  <0.10* <0.10*  --   CREATININE  --  0.51*  --   --  0.61 0.58*   < > = values in this interval not displayed.    Estimated Creatinine Clearance: 102.7 mL/min (A) (by C-G formula based on SCr of 0.58 mg/dL (L)).   Medical History: Past Medical History:  Diagnosis Date  . Arthritis   . CAD (coronary artery disease)    a. s/p CABG in 11/2019 with LIMA-LAD, SVG-OM1, SVG-PDA and SVG-D1  . CHF (congestive heart failure) (HCC)    a. EF < 20% by echo in 11/2019  . COPD (chronic obstructive pulmonary disease) (HCC)   . Essential hypertension   . PAF (paroxysmal atrial fibrillation) (HCC)   . Type 2 diabetes mellitus (HCC)      Assessment: 48 yoM admitted with CP and cardiogenic shock. Pt has hx AFib with no AC PTA, pharmacy to start IV heparin. Impella 5.5 placed 12/3 followed by HM3 LVAD 12/10. Warfarin started 12/12 post LVAD implant.   INR now therapeutic at 2.1 today. Heparin stopped yesterday. CBC stable, LDH stable. No overt bleeding noted. Pacing wires still in place. After discussion with Dr. 14/10, will hold warfarin tonight in anticipation for removal of pacing wires tomorrow.   Goal of Therapy:  INR goal 2-2.5 Monitor  platelets by anticoagulation protocol: Yes   Plan:  Hold warfarin tonight Daily INR, CBC, s/sx bleeding Will provide education prior to discharge  Gala Romney, PharmD, BCPS PGY2 Cardiology Pharmacy Resident Phone: [phone] 11/26/2020  1:25 PM  Please check [email or web address] for unit-specific pharmacy phone numbers.

## 2020-11-26 NOTE — Progress Notes (Signed)
      [address]       [place] [address]             [phone]        8 Days Post-Op Procedure(s) (LRB): REDO STERNOTOMY (N/A) INSERTION OF IMPLANTABLE LEFT VENTRICULAR ASSIST DEVICE - HM3 (N/A) TRANSESOPHAGEAL ECHOCARDIOGRAM (TEE) (N/A) Subjective: Feels well wants to go home tomorrow   Objective: Vital signs in last 24 hours: Temp:  [97.6 F (36.4 C)-98.2 F (36.8 C)] 98.2 F (36.8 C) (12/19 0711) Pulse Rate:  [87-93] 90 (12/19 0711) Cardiac Rhythm: Normal sinus rhythm (12/19 0542) Resp:  [15-20] 15 (12/19 0500) BP: (89-102)/(76-89) 100/84 (12/19 0500) SpO2:  [95 %-98 %] 96 % (12/19 0826) FiO2 (%):  [21 %] 21 % (12/19 0826) Weight:  [79.9 kg] 79.9 kg (12/19 0500)  Hemodynamic parameters for last 24 hours: CVP:  [0 mmHg-10 mmHg] 8 mmHg  Intake/Output from previous day: 12/18 0701 - 12/19 0700 In: 624.5 [P.O.:480; I.V.:144.5] Out: 500 [Urine:500] Intake/Output this shift: Total I/O In: 240 [P.O.:240] Out: -   General appearance: alert and cooperative Neurologic: intact Heart: regular rate and rhythm, S1, S2 normal, no murmur, click, rub or gallop Lungs: clear to auscultation bilaterally Abdomen: soft, non-tender; bowel sounds normal; no masses,  no organomegaly Extremities: edema mild Wound:  Intact   Lab Results: Recent Labs    11/25/20 0410 11/26/20 0540  WBC 11.4* 12.3*  HGB 10.4* 10.2*  HCT 31.2* 32.2*  PLT 294 345   BMET:  Recent Labs    11/25/20 0410 11/26/20 0540  NA 132* 134*  K 3.7 3.9  CL 96* 98  CO2 27 27  GLUCOSE 140* 100*  BUN 9 14  CREATININE 0.61 0.58*  CALCIUM 8.4* 8.7*    PT/INR:  Recent Labs    11/26/20 0540  LABPROT 22.4*  INR 2.1*   ABG    Component Value Date/Time   PHART 7.508 (H) 11/20/2020 0438   HCO3 27.9 11/20/2020 0438   TCO2 29 11/20/2020 0438   ACIDBASEDEF 6.0 (H) 11/24/2019 1718   O2SAT 66.0 11/26/2020 0540   CBG (last 3)  Recent Labs    11/25/20 2128 11/26/20 0640  11/26/20 1140  GLUCAP 105* 84 149*    Assessment/Plan: S/P Procedure(s) (LRB): REDO STERNOTOMY (N/A) INSERTION OF IMPLANTABLE LEFT VENTRICULAR ASSIST DEVICE - HM3 (N/A) TRANSESOPHAGEAL ECHOCARDIOGRAM (TEE) (N/A) Patient wants to go home monday  Continue anticoagulation PT/OT   LOS: 18 days    Asa decreased to 81 mg inr 2.1, hold coumadin tonight- passing wires out tomorrow depending on inr  Delight Ovens MD      [address] [place] [address] Office [phone]        Delight Ovens 11/26/2020

## 2020-11-27 LAB — PROTIME-INR
INR: 1.8 — ABNORMAL HIGH (ref 0.8–1.2)
Prothrombin Time: 20.6 seconds — ABNORMAL HIGH (ref 11.4–15.2)

## 2020-11-27 LAB — BASIC METABOLIC PANEL
Anion gap: 10 (ref 5–15)
BUN: 12 mg/dL (ref 6–20)
CO2: 26 mmol/L (ref 22–32)
Calcium: 8.7 mg/dL — ABNORMAL LOW (ref 8.9–10.3)
Chloride: 98 mmol/L (ref 98–111)
Creatinine, Ser: 0.6 mg/dL — ABNORMAL LOW (ref 0.61–1.24)
GFR, Estimated: >60 mL/min (ref >60)
Glucose, Bld: 98 mg/dL (ref 70–99)
Potassium: 3.9 mmol/L (ref 3.5–5.1)
Sodium: 134 mmol/L — ABNORMAL LOW (ref 135–145)

## 2020-11-27 LAB — CBC
HCT: 32.6 % — ABNORMAL LOW (ref 39.0–52.0)
Hemoglobin: 10.5 g/dL — ABNORMAL LOW (ref 13.0–17.0)
MCH: 28.2 pg (ref 26.0–34.0)
MCHC: 32.2 g/dL (ref 30.0–36.0)
MCV: 87.6 fL (ref 80.0–100.0)
Platelets: 354 10*3/uL (ref 150–400)
RBC: 3.72 MIL/uL — ABNORMAL LOW (ref 4.22–5.81)
RDW: 14.8 % (ref 11.5–15.5)
WBC: 11.3 10*3/uL — ABNORMAL HIGH (ref 4.0–10.5)
nRBC: 0 % (ref 0.0–0.2)

## 2020-11-27 LAB — COOXEMETRY PANEL
Carboxyhemoglobin: 1.7 % — ABNORMAL HIGH (ref 0.5–1.5)
Methemoglobin: 0.7 % (ref 0.0–1.5)
O2 Saturation: 49.7 %
Total hemoglobin: 10.8 g/dL — ABNORMAL LOW (ref 12.0–16.0)

## 2020-11-27 LAB — GLUCOSE, CAPILLARY
Glucose-Capillary: 11 mg/dL — CL (ref 70–99)
Glucose-Capillary: 23 mg/dL — CL (ref 70–99)
Glucose-Capillary: 89 mg/dL (ref 70–99)
Glucose-Capillary: 181 mg/dL — ABNORMAL HIGH (ref 70–99)

## 2020-11-27 LAB — LACTATE DEHYDROGENASE: LDH: 222 U/L — ABNORMAL HIGH (ref 98–192)

## 2020-11-27 MED ORDER — POTASSIUM CHLORIDE CRYS ER 20 MEQ PO TBCR
20.0000 meq | EXTENDED_RELEASE_TABLET | Freq: Every day | ORAL | Status: DC
  Administered 2020-11-27: 13:00:00 20 meq via ORAL
  Filled 2020-11-27: qty 1

## 2020-11-27 MED ORDER — SPIRONOLACTONE 12.5 MG HALF TABLET
12.5000 mg | ORAL_TABLET | Freq: Every day | ORAL | Status: DC
  Administered 2020-11-27 – 2020-11-28 (×2): 12.5 mg via ORAL
  Filled 2020-11-27 (×2): qty 1

## 2020-11-27 MED ORDER — COUMADIN BOOK
Freq: Once | Status: DC
  Filled 2020-11-27: qty 1

## 2020-11-27 MED ORDER — WARFARIN SODIUM 4 MG PO TABS: 4.0000 mg | ORAL_TABLET | Freq: Once | ORAL | Status: DC

## 2020-11-27 MED ORDER — WARFARIN SODIUM 4 MG PO TABS
4.0000 mg | ORAL_TABLET | Freq: Every day | ORAL | Status: DC
  Administered 2020-11-27: 19:00:00 4 mg via ORAL
  Filled 2020-11-27: qty 1

## 2020-11-27 NOTE — Plan of Care (Signed)
  Problem: Education: Goal: Knowledge of General Education information will improve Description: Including pain rating scale, medication(s)/side effects and non-pharmacologic comfort measures Outcome: Progressing   Problem: Health Behavior/Discharge Planning: Goal: Ability to manage health-related needs will improve Outcome: Progressing   Problem: Clinical Measurements: Goal: Ability to maintain clinical measurements within normal limits will improve Outcome: Progressing Goal: Will remain free from infection Outcome: Progressing Goal: Diagnostic test results will improve Outcome: Progressing Goal: Respiratory complications will improve Outcome: Progressing Goal: Cardiovascular complication will be avoided Outcome: Progressing   Problem: Activity: Goal: Risk for activity intolerance will decrease Outcome: Progressing   Problem: Nutrition: Goal: Adequate nutrition will be maintained Outcome: Progressing   Problem: Coping: Goal: Level of anxiety will decrease Outcome: Progressing   Problem: Elimination: Goal: Will not experience complications related to bowel motility Outcome: Progressing Goal: Will not experience complications related to urinary retention Outcome: Progressing   Problem: Pain Managment: Goal: General experience of comfort will improve Outcome: Progressing   Problem: Safety: Goal: Ability to remain free from injury will improve Outcome: Progressing   Problem: Skin Integrity: Goal: Risk for impaired skin integrity will decrease Outcome: Progressing   Problem: Education: Goal: Understanding of cardiac disease, CV risk reduction, and recovery process will improve Outcome: Progressing Goal: Individualized Educational Video(s) Outcome: Progressing   Problem: Activity: Goal: Ability to tolerate increased activity will improve Outcome: Progressing   Problem: Cardiac: Goal: Ability to achieve and maintain adequate cardiovascular perfusion will  improve Outcome: Progressing   Problem: Health Behavior/Discharge Planning: Goal: Ability to safely manage health-related needs after discharge will improve Outcome: Progressing   Problem: Education: Goal: Understanding of CV disease, CV risk reduction, and recovery process will improve Outcome: Progressing Goal: Individualized Educational Video(s) Outcome: Progressing   Problem: Activity: Goal: Ability to return to baseline activity level will improve Outcome: Progressing   Problem: Cardiovascular: Goal: Ability to achieve and maintain adequate cardiovascular perfusion will improve Outcome: Progressing Goal: Vascular access site(s) Level 0-1 will be maintained Outcome: Progressing   Problem: Health Behavior/Discharge Planning: Goal: Ability to safely manage health-related needs after discharge will improve Outcome: Progressing   Problem: Education: Goal: Knowledge of the prescribed therapeutic regimen will improve Outcome: Progressing   Problem: Activity: Goal: Risk for activity intolerance will decrease Outcome: Progressing   Problem: Cardiac: Goal: Ability to maintain an adequate cardiac output will improve Outcome: Progressing   Problem: Coping: Goal: Level of anxiety will decrease Outcome: Progressing   Problem: Fluid Volume: Goal: Risk for excess fluid volume will decrease Outcome: Progressing   Problem: Clinical Measurements: Goal: Ability to maintain clinical measurements within normal limits will improve Outcome: Progressing Goal: Will remain free from infection Outcome: Progressing   Problem: Respiratory: Goal: Will regain and/or maintain adequate ventilation Outcome: Progressing   

## 2020-11-27 NOTE — Progress Notes (Addendum)
Patient ID: MACHI WHITTAKER, male   DOB: February 11, 1961, 59 y.o.   MRN: [id number]     Advanced Heart Failure Rounding Note  PCP-Cardiologist: Nona Dell, MD   Subjective:    - 12/2 Had cath as noted below with intact revascularization. CO-OX 39%, started on milrinone.  - 12/3 Impella 5.5 placed - 12/6 Impella speed increased to P8. Started on lasix drip and given metolazone. Brisk diuresis noted. Negative 5 liters.  - 12/8 Swan removed, PICC placed pre-LVAD.  - 12/10 Impella removed, Heartmate 3 LVAD placed.  - 12/11 Extubated - 12/15 Ramp echo (difficult images): Speed left at 5500  POD #10 VAD.   Co-ox lower today off milrinone at 50% (66% yesterday, repeat co-ox pending).  CVP 8   MAPs elevated low 100s.   Wt down 1 lb.   No complaints today. Denies dyspnea. Eager to go home.   LVAD Interrogation HM 3: Speed: 5500 Flow: 4.3  PI: 3.1  Power: 4.0  No PI events  VAD interrogated personally. Parameters stable.  Objective:   Weight Range: 79.6 kg Body mass index is 25.18 kg/m.   Vital Signs:   Temp:  [97.6 F (36.4 C)-98.5 F (36.9 C)] 97.6 F (36.4 C) (12/20 0506) Pulse Rate:  [88-99] 96 (12/20 0857) Resp:  [15-20] 20 (12/20 0857) BP: (101-125)/(86-93) 125/93 (12/19 2329) SpO2:  [95 %-98 %] 98 % (12/20 0506) Weight:  [79.6 kg] 79.6 kg (12/20 0500) Last BM Date: 11/23/20  Weight change: Filed Weights   11/25/20 0420 11/26/20 0500 11/27/20 0500  Weight: 79.4 kg 79.9 kg 79.6 kg    Intake/Output:   Intake/Output Summary (Last 24 hours) at 11/27/2020 0954 Last data filed at 11/26/2020 1700 Gross per 24 hour  Intake 240 ml  Output 400 ml  Net -160 ml      Physical Exam   General:  Well appearing, laying in bed. No respiratory difficulty  HEENT: normal  Neck: supple. JVP 7-8 cm.  Carotids 2+ bilat; no bruits. No lymphadenopathy or thryomegaly appreciated. Cor: sternal wound ok  LVAD hum.  + pacing wires  Lungs: CTAB. No wheezing  Abdomen: obese soft,  nontender, non-distended. No hepatosplenomegaly. No bruits or masses. Good bowel sounds. Driveline site clean. Anchor in place.  Extremities: no cyanosis, clubbing, rash. Warm and dry. No edema  Neuro: alert & oriented x 3. No focal deficits. Moves all 4 without problem  erves grossly intact. moves all 4 extremities w/o difficulty. Affect pleasant    Telemetry   NSR 90s Personally reviewed  Labs    CBC Recent Labs    11/26/20 0540 11/27/20 0504  WBC 12.3* 11.3*  HGB 10.2* 10.5*  HCT 32.2* 32.6*  MCV 87.5 87.6  PLT 345 354   Basic Metabolic Panel Recent Labs    30/16/01 0540 11/27/20 0504  NA 134* 134*  K 3.9 3.9  CL 98 98  CO2 27 26  GLUCOSE 100* 98  BUN 14 12  CREATININE 0.58* 0.60*  CALCIUM 8.7* 8.7*  MG 2.0  --    Liver Function Tests No results for input(s): AST, ALT, ALKPHOS, BILITOT, PROT, ALBUMIN in the last 72 hours. No results for input(s): LIPASE, AMYLASE in the last 72 hours. Cardiac Enzymes No results for input(s): CKTOTAL, CKMB, CKMBINDEX, TROPONINI in the last 72 hours.  BNP: BNP (last 3 results) Recent Labs    11/08/20 1344 11/18/20 0327 11/24/20 0045  BNP 910.0* 392.0* 252.0*    ProBNP (last 3 results) No results for input(s):  PROBNP in the last 8760 hours.   D-Dimer No results for input(s): DDIMER in the last 72 hours. Hemoglobin A1C No results for input(s): HGBA1C in the last 72 hours. Fasting Lipid Panel No results for input(s): CHOL, HDL, LDLCALC, TRIG, CHOLHDL, LDLDIRECT in the last 72 hours. Thyroid Function Tests No results for input(s): TSH, T4TOTAL, T3FREE, THYROIDAB in the last 72 hours.  Invalid input(s): FREET3  Other results:   Imaging    No results found.   Medications:     Scheduled Medications: . amiodarone  200 mg Oral BID  . aspirin EC  81 mg Oral Daily  . bisacodyl  10 mg Oral Daily   Or  . bisacodyl  10 mg Rectal Daily  . Chlorhexidine Gluconate Cloth  6 each Topical Daily  . colchicine  0.6  mg Oral BID  . digoxin  0.125 mg Oral Daily  . docusate sodium  200 mg Oral Daily  . feeding supplement (GLUCERNA SHAKE)  237 mL Oral TID BM  . furosemide  40 mg Oral Daily  . insulin aspart  2-6 Units Subcutaneous TID AC & HS  . insulin detemir  10 Units Subcutaneous Q12H  . ipratropium-albuterol  3 mL Nebulization BID  . lidocaine  1 patch Transdermal Q24H  . mometasone-formoterol  2 puff Inhalation BID  . multivitamin with minerals  1 tablet Oral Daily  . pantoprazole  40 mg Oral QHS  . QUEtiapine  25 mg Oral QHS  . rosuvastatin  40 mg Oral q1800  . sacubitril-valsartan  1 tablet Oral BID  . sodium chloride flush  10-40 mL Intracatheter Q12H  . sodium chloride flush  3 mL Intravenous Q12H  . Warfarin - Pharmacist Dosing Inpatient   Does not apply q1600    Infusions: . sodium chloride    . sodium chloride Stopped (11/21/20 2144)  . lactated ringers 10 mL/hr at 11/17/20 0700  . lactated ringers    . 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 type 2 diabetes mellitus, COPD, and hypertension. Now s/p Heartmate 3 LVAD.   Assessment/Plan   1. Acute on chronic systolic CHF with cardiogenic shock: Echo 10/21 with EF 20-25%, mildly decreased RV function (persistently low). LHC/RHC this admission with patent grafts, low output. Suspect mixed ischemic/nonischemic cardiomyopathy (prior heavy ETOH and drugs as well as CAD).  No ETOH, drugs, smoking for about 1 year since CABG in 12/20. NYHA  class IV at admission.  Had placement of Impella 5.5 initially, now s/p Heartmate 3 LVAD on 12/10.  Off milrinone. Co-ox 50% (repeat pending)  - Volume status ok. CVP 7-8. Continue PO Lasix 40 mg.  - MAPs elevated, low 100s. SCr/K WNL - Add Spiro 12.5 mg daily  - Continue Entresto 24-26 BID - Continue digoxin 0.125. Check dig level in AM   - Continue ASA 81 mg daily. INR lower at 1.8 today (coumadin held for pacer wire removal). Plan to resume coumadin tonight.  Discussed dosing with PharmD personally. - VAD interrogated personally. Parameters stable. LDH 222 - INR 1.8 Discussed dosing with PharmD personally.  -  Patient still has pacing wires in. D/w Dr. Vickey Sages. Plan removal today   - Plan VAD teaching today  2. Acute hypoxemic respiratory failure: Now extubated and stable.  3. CAD: S/p CABG 12/20.  LHC this admission with patent grafts, no target for intervention. - Continue ASA and statin.  - No s/s angina 4. Atrial fibrillation: Paroxysmal.  S/p Maze and LA appendage clip with CABG in 12/20.  Back in NSR today - Continue PO amio 200 mg bid.   - INR 1.8. Resume coumadin tonight after pacing wire removal  5. Type 2 diabetes: A1c 10.4, poor control.  Continue insulin.  6. Prior ETOH, drugs: None x 1 year.  7. Anemia: Post-op, transfuse hgb < 8.  CBC pending   8. COPD: PFTs showed FEV1 1.79 (limited study due to LVAD).    Length of Stay: [address] PA-C  11/27/2020, 9:54 AM  Advanced Heart Failure Team Pager [phone] (M-F; 7a - 4p)  Please contact [facility] for night-coverage after hours (4p -7a ) and weekends on [email or web address]  Patient seen and examined with the above-signed Advanced Practice Provider and/or Housestaff. I personally reviewed laboratory data, imaging studies and relevant notes. I independently examined the patient and formulated the important aspects of the plan. I have edited the note to reflect any of my changes or salient points. I have personally  discussed the plan with the patient and/or family.  Not sleeping well. Wants to go home. Denies CP or SOB. INR 1.8  General:  NAD.  HEENT: normal  Neck: supple. JVP not elevated.  Carotids 2+ bilat; no bruits. No lymphadenopathy or thryomegaly appreciated. Cor: LVAD hum.  Lungs: Clear. Abdomen: obese soft, nontender, non-distended. No hepatosplenomegaly. No bruits or masses. Good bowel sounds. Driveline site clean. Anchor in place.  Extremities: no cyanosis, clubbing, rash. Warm no edema  Neuro: alert & oriented x 3. No focal deficits. Moves all 4 without problem   Stable from HF/VAD standpoint. Pacing wirse pulled today. We discussed with [facility]CTS and they want to watch him for 24 hours after wires pulled. INR 1.8. VAD interrogated personally. Parameters stable.  Will watch today. Likely home in am. Give coumadin tonight. Discussed dosing with PharmD personally.  Arvilla Meres, MD  7:25 PM

## 2020-11-27 NOTE — Progress Notes (Signed)
VAD Discharge Teaching Note:  Discharge VAD teaching completed with    The home inspection checklist has been reviewed and no unsafe conditions have been identified. Family reports that there are at least two dedicated grounded, 3-prong outlets with clearly labeled circuit breaker has been established in the bedroom for power module and Magazine features editor.   Both patient and caregivers have been trained on the following:  1. HM III LVAD overview of system operations  2. Overview of major lifestyle accommodations and cautions   3. Overview of system components (features and functions) 4. Changing power sources 5. Overview of alerts and alarms 6. How to identify and manage an emergency including when pump is running and when pump has stopped  7. Changing system controller 8. Maintain emergency contact list and medications  The patient and caregiver(s) have successfully demonstrated:  1. Changing power source (from batteries to mobile power unit, mobile power unit to batteries, and replacing batteries) 2. Perform system controller self test  3. Check and charge batteries  4. Change system controller 5. Paged VAD pager and programmed number in phones  A daily flow sheet with patient  weight, temperature,  flow, speed, power, and PI, along with daily self checks on system controller and power module have been performed by patient and caregiver(s) during hospitalization and will also be done daily at home.   The caregiver(s) has been trained on percutaneous lead exit site care, care of the driveline and dressing changes. They have performed dressing changes during patient's hospitalization under my supervision with the support of the nursing staff. The importance of lead immobilization has been stressed to patient and caregiver(s) using the attachment device. The caregiver has successfully demonstrated the following:  1. Cleansing site with sterile technique 2. Dressing care and maintenance  3.  Immobilizing driveline  The following routine activities and maintenance have been reviewed with patient and caregiver(s) and both verbalize understanding:  1. Stressed importance of never disconnecting power from both controller power leads at the same time, and never disconnecting both batteries at the same time, or the pump will stop 2. Plug the mobile power unit (MPU) and the universal battery charger (UBC) into properly grounded (3 prong) outlets dedicated to PM use. Do NOT use adapter (cheater plug) for ungrounded outlets or multiple portable socket outlets (power strips) 3. Do not connect the PM or MPU to an outlet controlled by wall switch or the device may not work 4. Transfer from MPU to batteries during [facility] mains power failure. The PM has internal backup battery that will power the pump while you transfer to batteries 5. Keep a backup system controller, charged batteries, battery clips, and flashlight near you during sleep in case of electrical power outage 6. Clean battery, battery clip, and universal battery charger contacts weekly 7. Visually inspect percutaneous lead daily 8. Check cables and connectors when changing power source  9. Rotate batteries; keep all eight batteries charged 10. Always have backup system controller, battery clips, fully charged batteries, and spare fully charged batteries when traveling 11. Re-calibrate batteries every 70 uses; monitor battery life of 36 months or 360 uses; replace batteries at end of battery life   Identified the following changes in activities of daily living with pump:  1. No driving for at least six weeks and then only if doctor gives permission to do so 2. No tub baths while pump implanted, and shower only if doctor gives permission 3. No swimming or submersion in water while implanted with  pump 4. Keep all VAD equipment away from water or moisture 5. Keep all VAD connections clean and dry 6. No contact sports or engage in jumping  activities 7. Avoid strong static electricity (touching TV/computer screens, vacuuming) 8. Never have an MRI while implanted with the pump 9. Never leave or store batteries in extremely hot or cold places (such as   trunk of your car), or the battery life will be shortened 10. Call the doctor or hospital contact person if any change in how the pump sounds, feels, or works 11. Plan to sleep only when connected to the power module. 12. Keep a backup system controller, charged batteries, battery clips, and flashlight near you during sleep in case of electrical power outage 13. Do not sleep on your stomach 14. Talk with doctor before any long distance travel plans 15. Patient will need antibiotics prior to any dental procedure; instructed to contact VAD coordinator before any dental procedures (including routine cleaning)   Discharge binder given to patient and include the following: 1. List of emergency contacts 2. Wallet card 3. HM III Luggage tags 4. HM III Alarms for Patients and their Caregivers 5. HM III Patient Handbook 6. HM III Patient Education Program DVD 7. Daily diary sheets 8. Warfarin teaching sheets 9. Nosebleed teaching sheets 10. Medications you may and may not take with CHF list  Discharge equipment includes:  1. Two system controllers 2. One mobile power unit with attached 21 ' patient cable 3. One universal Magazine features editor (UBC) 4. Eight fully charged batteries  5. Four battery clips 6. One travel case 7. One holster vest 8. Wearable accessory package 9. Daily dressing kits and anchors  Following notification process completed with:    [facility]  [employer]    Dr Kirstie Peri,  PCP   Discussed frequency and importance of INR checks; emphasized importance of maintaining INR goal to prevent clotting and or bleeding issues with pump. Able to answer questions and asked good questions pertaining to warfarin and diet/lifestyle changes  necessary to be successful and safe. He has expressed interest in obtaining home INR monitor once appropriate. Will work with him in the future for this equipment.   The patient has completed a proficiency test for the HM III and all questions have been answered. The pt and family have been instructed to call if any questions, problems, or concerns arise. Pt and caregiver successfully paged VAD coordinator using VAD pager emergency number and have been instructed to use this number only for emergencies.  Patient and caregiver(s) asked appropriate questions, had good interaction with VAD coordinator, and verbalized understanding of above instructions.    Alyce Pagan RN VAD Coordinator  Office: [phone]  24/7 Pager: [phone]

## 2020-11-27 NOTE — Progress Notes (Signed)
LVAD Coordinator Rounding Note:  Admitted 11/08/20 due to cardiogenic shock.   HM 3 LVAD implanted on 11/17/20 by ZA under DT criteria.  POD #10.  No events overnight. Back in bed this morning after walking in the hall. Feels well. No dyspnea. O2 sats 98% on RA.   Pt is frustrated with being in the hospital. States he hasn't slept in 3 days, and wants to go home. Explained need to complete discharge teaching, and await arrival of home equipment. Denies pain, other than left shoulder pain that he rates 5/10. Needs pacing wires pulled today.    VAD discharge teaching completed today with both patient and his wife [name].   Vital signs: Tmax: 97.6 HR: 96 NSR w/PACs Doppler Pressure: 92 Automatic BP: not done O2 Sat: 98 on RA Wt:  199.9>189.6>177.4>178.1>180.7>175.5 lbs   LVAD interrogation reveals:  Speed: 5500 Flow: 4.4 Power:  4.1w  PI: 3.0 Alarms: none Events:  none today; 45 PI events 12/19 Hematocrit: 33  Fixed speed: 5500 Low speed limit: 5200   Drive Line: Wife performed dressing change independently yesterday. Dressing clean, dry, intact. Drive line anchor secure x 2. Continue every other day dressing changes. Next dressing change due: 11/28/20.    Labs:  LDH trend: 297>340>260>235>228>222  INR trend: 1.3>1.4>1.6>1.8>1.7>1.8  Anticoagulation Plan: -INR Goal: 2-2.5 -ASA Dose: 325 mg d/c date of 12/18/20  Blood Products:  Intra-op 11/17/20: 4 FFP 2 PLT 2 PRBC 2 Cryo 380 cc of cell saver  Post op: 11/19/20>> 1 u/PRBC  Device: -NONE  Respiratory: Extubated 11/18/20  Infection:  Renal:  -BUN/CRT: 11/0.56>9/0.68>10/0.65> 9/0.54>9/0.51> 12/0.60  Gtts: Milrinone 0.125 mcg/kg/min-off Lasix 6 mg/hr-off Amiodarone 30 mg/hr--off Heparin 500 u/hr -- off  Adverse Events on VAD:  Patient Education: 1. Wife completed dressing change training with VAD coordinator and is checked off to perform with bedside RN.  2. Continuing education: >>12/13- Pt and  caregiver taught how to change from PM to batteries. Pt/caregiver successfully demonstrated switching power sources. >>12/14- pt and caregiver educated on controller - including all soft keys, uses and internal backup battery. Pt demonstrated self test today. >>12/16- pt and wife state they feel "very comfortable" with power source changes, the controller, and performing self test. Pt reports he is tired and would like to rest today.  >>12/17-pt and wife educated on UBC and MPU including maintenance, charging batteries and battery rotation. >> 12/20- completed remaining discharge teaching and test check off with pt & his wife [name]. All questions answered at this time. (see separate note)   Plan/Recommendations:  1. Every other day dressing changes by VAD Coordinator or nurse champion. Next dressing change due 11/28/20. 2. Call VAD coordinator for any equipment issues. 3. Plan for possible discharge tomorrow once cleared by TCTS post-pacing wire removal.    Alyce Pagan RN VAD Coordinator  Office: [phone]  24/7 Pager: [phone]

## 2020-11-27 NOTE — Progress Notes (Signed)
Began discussing sternal precautions and IS but pt needed to have VAD teaching. Returned later and pt requested to hold further education until tomorrow. Will f/u then. [phone] Ethelda Chick CES, ACSM 2:36 PM 11/27/2020

## 2020-11-27 NOTE — Progress Notes (Signed)
CARDIAC REHAB PHASE I   PRE:  Rate/Rhythm: 97 SR with PACs    BP: sitting 90 dopplar    SaO2: 96 RA  MODE:  Ambulation: 640 ft   POST:  Rate/Rhythm: 106 ST with PACs    BP: sitting 130 dopplar, recheck in bed dinamapp MAP 100     SaO2: wouldn't register   Pt agreed to ambulation. Donned (and doffed) his equipment independently although hastily. Given reminders to take his time and be careful not to jerk or drop equipment. Steady walking, no device, carried his black bag, no sx, quick pace. He is generally frustrated with being in hospital. MAP elevated with dopplar after walk, 130. Once in bed, dinamapp MAP 100. Pt requests education with his wife when she arrives.  [phone]  Harriet Masson CES, ACSM 11/27/2020 8:59 AM

## 2020-11-27 NOTE — Progress Notes (Addendum)
ANTICOAGULATION CONSULT NOTE   Pharmacy Consult for Warfarin  Indication: atrial fibrillation, new Heart Mate III LVAD implant   No Known Allergies  Patient Measurements: Height: 5\' 10"  (177.8 cm) Weight: 79.6 kg (175 lb 7.8 oz) IBW/kg (Calculated) : 73 Heparin Dosing Weight: 82kg  Vital Signs: Temp: 97.6 F (36.4 C) (12/20 0506) Temp Source: Oral (12/20 0506) BP: 125/93 (12/19 2329) Pulse Rate: 96 (12/20 0857)  Labs: Recent Labs    11/24/20 1450 11/25/20 0410 11/25/20 0410 11/26/20 0540 11/27/20 0504  HGB  --  10.4*   < > 10.2* 10.5*  HCT  --  31.2*  --  32.2* 32.6*  PLT  --  294  --  345 354  LABPROT  --  20.3*  --  22.4* 20.6*  INR  --  1.8*  --  2.1* 1.8*  HEPARINUNFRC <0.10* <0.10*  --   --   --   CREATININE  --  0.61  --  0.58* 0.60*   < > = values in this interval not displayed.    Estimated Creatinine Clearance: 102.7 mL/min (A) (by C-G formula based on SCr of 0.6 mg/dL (L)).   Medical History: Past Medical History:  Diagnosis Date  . Arthritis   . CAD (coronary artery disease)    a. s/p CABG in 11/2019 with LIMA-LAD, SVG-OM1, SVG-PDA and SVG-D1  . CHF (congestive heart failure) (HCC)    a. EF < 20% by echo in 11/2019  . COPD (chronic obstructive pulmonary disease) (HCC)   . Essential hypertension   . PAF (paroxysmal atrial fibrillation) (HCC)   . Type 2 diabetes mellitus (HCC)      Assessment: 69 yoM admitted with CP and cardiogenic shock. Pt has hx AFib with no AC PTA, pharmacy to start IV heparin. Impella 5.5 placed 12/3 followed by HM3 LVAD 12/10. Warfarin started 12/12 post LVAD implant.   CBC stable, LDH stable. No overt bleeding noted.   Coumadin held last night in anticipation of pulling pacing wires this AM.  INR down to 1.8.  Goal of Therapy:  INR goal 2-2.5 Monitor platelets by anticoagulation protocol: Yes   Plan:  Resume warfarin tonight with 4 mg daily. Daily INR, CBC, s/sx bleeding Will provide warfarin education later  today.  14/10, Reece Leader, BCCP Clinical Pharmacist  11/27/2020 10:11 AM   [facility] pharmacy phone numbers are listed on [email or web address]

## 2020-11-27 NOTE — Progress Notes (Signed)
PT Cancellation Note  Patient Details Name: Kevin Underwood MRN: [id number] DOB: Jul 04, 1961   Cancelled Treatment:    Reason Eval/Treat Not Completed: Patient declined, no reason specified Pt upset that he is not being discharged today; "I am not doing anything else today." Wife present in room. Will follow.   Blake Divine A Berdella Bacot 11/27/2020, 2:59 PM [place], PT, DPT Acute Rehabilitation Services Pager [phone] Office [phone]

## 2020-11-27 NOTE — Hospital Course (Signed)
Patient INR decreased to 1.8 this am. Per Dr. Orvan Seen, epicardial pacing wires to be removed. I removed EPW and met very minor resistance. Patient's heart rate did go above 105 briefly then was in the 90's. Of note, patient with a history of PAF. Monitor vital signs closely for the next one hour.

## 2020-11-27 NOTE — Progress Notes (Signed)
Pt informed this RN that he does not want to be disturbed at midnight for the LVAD assessment. He stated " I just want to be left the hell alone and not bothered." RN will attempt to assess LVAD with am lab draw. Will continue to monitor.

## 2020-11-28 ENCOUNTER — Inpatient Hospital Stay (HOSPITAL_COMMUNITY): Payer: 59

## 2020-11-28 ENCOUNTER — Other Ambulatory Visit (HOSPITAL_COMMUNITY): Payer: Self-pay | Admitting: Cardiology

## 2020-11-28 LAB — BASIC METABOLIC PANEL
Anion gap: 9 (ref 5–15)
BUN: 11 mg/dL (ref 6–20)
CO2: 26 mmol/L (ref 22–32)
Calcium: 8.6 mg/dL — ABNORMAL LOW (ref 8.9–10.3)
Chloride: 100 mmol/L (ref 98–111)
Creatinine, Ser: 0.56 mg/dL — ABNORMAL LOW (ref 0.61–1.24)
GFR, Estimated: >60 mL/min (ref >60)
Glucose, Bld: 125 mg/dL — ABNORMAL HIGH (ref 70–99)
Potassium: 3.6 mmol/L (ref 3.5–5.1)
Sodium: 135 mmol/L (ref 135–145)

## 2020-11-28 LAB — CBC
HCT: 29.7 % — ABNORMAL LOW (ref 39.0–52.0)
Hemoglobin: 9.8 g/dL — ABNORMAL LOW (ref 13.0–17.0)
MCH: 28.9 pg (ref 26.0–34.0)
MCHC: 33 g/dL (ref 30.0–36.0)
MCV: 87.6 fL (ref 80.0–100.0)
Platelets: 377 10*3/uL (ref 150–400)
RBC: 3.39 MIL/uL — ABNORMAL LOW (ref 4.22–5.81)
RDW: 14.9 % (ref 11.5–15.5)
WBC: 9.6 10*3/uL (ref 4.0–10.5)
nRBC: 0 % (ref 0.0–0.2)

## 2020-11-28 LAB — LACTATE DEHYDROGENASE: LDH: 190 U/L (ref 98–192)

## 2020-11-28 LAB — COOXEMETRY PANEL
Carboxyhemoglobin: 1.9 % — ABNORMAL HIGH (ref 0.5–1.5)
Methemoglobin: 0.7 % (ref 0.0–1.5)
O2 Saturation: 54.7 %
Total hemoglobin: 10.4 g/dL — ABNORMAL LOW (ref 12.0–16.0)

## 2020-11-28 LAB — GLUCOSE, CAPILLARY: Glucose-Capillary: 137 mg/dL — ABNORMAL HIGH (ref 70–99)

## 2020-11-28 LAB — DIGOXIN LEVEL: Digoxin Level: 0.2 ng/mL — ABNORMAL LOW (ref 0.8–2.0)

## 2020-11-28 LAB — PROTIME-INR
INR: 1.6 — ABNORMAL HIGH (ref 0.8–1.2)
Prothrombin Time: 18.1 seconds — ABNORMAL HIGH (ref 11.4–15.2)

## 2020-11-28 MED ORDER — WARFARIN SODIUM 4 MG PO TABS
4.0000 mg | ORAL_TABLET | Freq: Every day | ORAL | 5 refills | Status: DC
Start: 2020-11-28 — End: 2020-12-29

## 2020-11-28 MED ORDER — COLCHICINE 0.6 MG PO TABS: 0.6000 mg | ORAL_TABLET | Freq: Two times a day (BID) | ORAL | 3 refills | Status: DC

## 2020-11-28 MED ORDER — FUROSEMIDE 40 MG PO TABS: 40.0000 mg | ORAL_TABLET | Freq: Every day | ORAL | 5 refills | Status: DC

## 2020-11-28 MED ORDER — POTASSIUM CHLORIDE CRYS ER 20 MEQ PO TBCR
40.0000 meq | EXTENDED_RELEASE_TABLET | Freq: Once | ORAL | Status: AC
  Administered 2020-11-28: 10:00:00 40 meq via ORAL
  Filled 2020-11-28: qty 2

## 2020-11-28 MED ORDER — POTASSIUM CHLORIDE CRYS ER 20 MEQ PO TBCR: 40.0000 meq | EXTENDED_RELEASE_TABLET | Freq: Every day | ORAL | 3 refills | Status: DC

## 2020-11-28 MED ORDER — AMIODARONE HCL 200 MG PO TABS: 200.0000 mg | ORAL_TABLET | Freq: Every day | ORAL | 5 refills | Status: DC

## 2020-11-28 MED FILL — WARFARIN SODIUM 4 MG TABLET: 4 | 30 days supply | Qty: 30 | Fill #0

## 2020-11-28 MED FILL — AMIODARONE HCL 200 MG TAB: 200 | 30 days supply | Qty: 30 | Fill #0

## 2020-11-28 MED FILL — FUROSEMIDE 40 MG TABLET: 40 | 30 days supply | Qty: 30 | Fill #0

## 2020-11-28 MED FILL — COLCHICINE 0.6 MG TABS: 0.6 | 30 days supply | Qty: 60 | Fill #0

## 2020-11-28 NOTE — Plan of Care (Signed)
  Problem: Education: Goal: Knowledge of General Education information will improve Description: Including pain rating scale, medication(s)/side effects and non-pharmacologic comfort measures Outcome: Progressing   Problem: Health Behavior/Discharge Planning: Goal: Ability to manage health-related needs will improve Outcome: Progressing   Problem: Clinical Measurements: Goal: Ability to maintain clinical measurements within normal limits will improve Outcome: Progressing Goal: Will remain free from infection Outcome: Progressing Goal: Diagnostic test results will improve Outcome: Progressing Goal: Respiratory complications will improve Outcome: Progressing Goal: Cardiovascular complication will be avoided Outcome: Progressing   Problem: Activity: Goal: Risk for activity intolerance will decrease Outcome: Progressing   Problem: Nutrition: Goal: Adequate nutrition will be maintained Outcome: Progressing   Problem: Coping: Goal: Level of anxiety will decrease Outcome: Progressing   Problem: Elimination: Goal: Will not experience complications related to bowel motility Outcome: Progressing Goal: Will not experience complications related to urinary retention Outcome: Progressing   Problem: Pain Managment: Goal: General experience of comfort will improve Outcome: Progressing   Problem: Safety: Goal: Ability to remain free from injury will improve Outcome: Progressing   Problem: Skin Integrity: Goal: Risk for impaired skin integrity will decrease Outcome: Progressing   Problem: Education: Goal: Understanding of cardiac disease, CV risk reduction, and recovery process will improve Outcome: Progressing Goal: Individualized Educational Video(s) Outcome: Progressing   Problem: Activity: Goal: Ability to tolerate increased activity will improve Outcome: Progressing   Problem: Cardiac: Goal: Ability to achieve and maintain adequate cardiovascular perfusion will  improve Outcome: Progressing   Problem: Health Behavior/Discharge Planning: Goal: Ability to safely manage health-related needs after discharge will improve Outcome: Progressing   Problem: Education: Goal: Understanding of CV disease, CV risk reduction, and recovery process will improve Outcome: Progressing Goal: Individualized Educational Video(s) Outcome: Progressing   Problem: Activity: Goal: Ability to return to baseline activity level will improve Outcome: Progressing   Problem: Cardiovascular: Goal: Ability to achieve and maintain adequate cardiovascular perfusion will improve Outcome: Progressing Goal: Vascular access site(s) Level 0-1 will be maintained Outcome: Progressing   Problem: Health Behavior/Discharge Planning: Goal: Ability to safely manage health-related needs after discharge will improve Outcome: Progressing   Problem: Education: Goal: Knowledge of the prescribed therapeutic regimen will improve Outcome: Progressing   Problem: Activity: Goal: Risk for activity intolerance will decrease Outcome: Progressing   Problem: Cardiac: Goal: Ability to maintain an adequate cardiac output will improve Outcome: Progressing   Problem: Coping: Goal: Level of anxiety will decrease Outcome: Progressing   Problem: Coping: Goal: Level of anxiety will decrease Outcome: Progressing   Problem: Fluid Volume: Goal: Risk for excess fluid volume will decrease Outcome: Progressing   Problem: Clinical Measurements: Goal: Ability to maintain clinical measurements within normal limits will improve Outcome: Progressing Goal: Will remain free from infection Outcome: Progressing   Problem: Respiratory: Goal: Will regain and/or maintain adequate ventilation Outcome: Progressing

## 2020-11-28 NOTE — Progress Notes (Signed)
Physical Therapy Treatment Patient Details Name: Kevin Underwood MRN: [id number] DOB: [birth year] Today's Date: 11/28/2020    History of Present Illness Kevin Underwood is a 59 y.o. 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 Pt adm 12/01 with Acute on chronic systolic CHF with cardiogenic shock. Had impella placed 12/3. Underwent LVAD on 11/17/20.    PT Comments    Pt admitted with above diagnosis. Pt was able to ambulate without equipment in hallway with supervision and cues to slow down pace as pt walking fast due to anxious to go home.  Attempted to practice steps but alarm at stairwell went off (did not realize they put alarm on this stairwell) and pt got upset about the alarm and declined to practice steps.  Verbal discussion of how to ascend and descend steps with pt and wife.  They feel ready to go home.  HHPT recommended initially.   Pt currently with functional limitations due to balance and endurance deficits. Pt will benefit from skilled PT to increase their independence and safety with mobility to allow discharge to the venue listed below.     Follow Up Recommendations  Home health PT;Supervision/Assistance - 24 hour     Equipment Recommendations  Other (comment) (has rollator to borrow per pt)    Recommendations for Other Services       Precautions / Restrictions Precautions Precautions: Fall;Sternal;Other (comment) (LVAD) Precaution Booklet Issued: Yes (comment) Precaution Comments: continued education re: driveline safety and securing system controller.  Cues for manipulating equipment. Pt has LVAD Restrictions LUE Weight Bearing: Non weight bearing Other Position/Activity Restrictions: Sternal precautions    Mobility  Bed Mobility Overal bed mobility: Needs Assistance Bed Mobility: Supine to Sit Rolling:  Supervision Sidelying to sit: Supervision          Transfers Overall transfer level: Needs assistance Equipment used: None Transfers: Sit to/from Stand Sit to Stand: Supervision         General transfer comment: No assist needed to stand and pt did place hands on knees without cues  Ambulation/Gait Ambulation/Gait assistance: Supervision Gait Distance (Feet): 200 Feet Assistive device: None Gait Pattern/deviations: Step-through pattern;Decreased stride length   Gait velocity interpretation: <1.8 ft/sec, indicate of risk for recurrent falls General Gait Details: Pt did not need assist to walk on unit.  No LOB but was cued to slow down as pt was walking at fast pace. Pt wants to go home and is anxious to d/c.   Stairs Stairs:  (Was going to practice steps and when entered the stairwell alarm went off. Pt got frustrated and declined to go to another stairwell.  Discussed technique with pt and wife that pt can ascend and descend sideways with rail or forward with rail.)           Wheelchair Mobility    Modified Rankin (Stroke Patients Only)       Balance Overall balance assessment: Needs assistance Sitting-balance support: No upper extremity supported;Feet supported Sitting balance-Leahy Scale: Fair     Standing balance support: During functional activity;No upper extremity supported Standing balance-Leahy Scale: Fair Standing balance comment: can balance wihtout UE support                            Cognition Arousal/Alertness: Awake/alert Behavior During Therapy: WFL for tasks  assessed/performed Overall Cognitive Status: Within Functional Limits for tasks assessed                                 General Comments: Pt irritated today as he is ready to go home      Exercises      General Comments General comments (skin integrity, edema, etc.): VSS on RA      Pertinent Vitals/Pain Pain Assessment: No/denies pain    Home  Living                      Prior Function            PT Goals (current goals can now be found in the care plan section) Acute Rehab PT Goals Patient Stated Goal: to get stronger Progress towards PT goals: Progressing toward goals    Frequency    Min 3X/week      PT Plan Current plan remains appropriate    Co-evaluation              AM-PAC PT "6 Clicks" Mobility   Outcome Measure  Help needed turning from your back to your side while in a flat bed without using bedrails?: None Help needed moving from lying on your back to sitting on the side of a flat bed without using bedrails?: None Help needed moving to and from a bed to a chair (including a wheelchair)?: None Help needed standing up from a chair using your arms (e.g., wheelchair or bedside chair)?: None Help needed to walk in hospital room?: None Help needed climbing 3-5 steps with a railing? : A Little 6 Click Score: 23    End of Session Equipment Utilized During Treatment: Gait belt Activity Tolerance: Patient tolerated treatment well Patient left: with call bell/phone within reach;in chair;with family/visitor present Nurse Communication: Mobility status PT Visit Diagnosis: Muscle weakness (generalized) (M62.81);Other abnormalities of gait and mobility (R26.89)     Time: 9233-0076 PT Time Calculation (min) (ACUTE ONLY): 10 min  Charges:  $Gait Training: 8-22 mins                     Leomar Westberg W,PT Acute Rehabilitation Services Pager:  [phone]  Office:  [phone]     Berline Lopes 11/28/2020, 9:52 AM

## 2020-11-28 NOTE — TOC Initial Note (Signed)
Transition of Care [facility]) - Initial/Assessment Note    Patient Details  Name: Kevin Underwood MRN: [id number] Date of Birth: September 30, 1961  Transition of Care [facility]) CM/SW Contact:    Leone Haven, RN Phone Number: 11/28/2020, 11:59 AM  Clinical Narrative:                 NCM spoke with wife, she states patient does not want to speak with anyone .  NCM asked if he would like HHPT/HHOT/HHRN set up , he states no.  He does not need any DME per wife. He has follow up apt next per wife. She has supplies for dressing changes.  Expected Discharge Plan: Home w Home Health Services Barriers to Discharge: No Barriers Identified   Patient Goals and CMS Choice        Expected Discharge Plan and Services Expected Discharge Plan: Home w Home Health Services   Discharge Planning Services: CM Consult Post Acute Care Choice: NA Living arrangements for the past 2 months: Single Family Home Expected Discharge Date: 11/28/20                 DME Agency: NA       HH Arranged: Refused HH          Prior Living Arrangements/Services Living arrangements for the past 2 months: Single Family Home Lives with:: Spouse Patient language and need for interpreter reviewed:: Yes Do you feel safe going back to the place where you live?: Yes      Need for Family Participation in Patient Care: Yes (Comment) Care giver support system in place?: Yes (comment)   Criminal Activity/Legal Involvement Pertinent to Current Situation/Hospitalization: No - Comment as needed  Activities of Daily Living Home Assistive Devices/Equipment: None ADL Screening (condition at time of admission) Patient's cognitive ability adequate to safely complete daily activities?: Yes Is the patient deaf or have difficulty hearing?: No Does the patient have difficulty seeing, even when wearing glasses/contacts?: No Does the patient have difficulty concentrating, remembering, or making decisions?: No Patient able to  express need for assistance with ADLs?: Yes Does the patient have difficulty dressing or bathing?: No Independently performs ADLs?: Yes (appropriate for developmental age) Does the patient have difficulty walking or climbing stairs?: Yes Weakness of Legs: Both Weakness of Arms/Hands: None  Permission Sought/Granted                  Emotional Assessment       Orientation: : Oriented to Self,Oriented to Place,Oriented to  Time,Oriented to Situation Alcohol / Substance Use: Not Applicable Psych Involvement: No (comment)  Admission diagnosis:  Unstable angina (HCC) [I20.0] CHF (congestive heart failure), NYHA class IV, chronic, systolic (HCC) [I50.22] Patient Active Problem List   Diagnosis Date Noted  . Malnutrition of moderate degree 11/17/2020  . CHF (congestive heart failure), NYHA class IV, chronic, systolic (HCC) 11/17/2020  . Palliative care by specialist   . Goals of care, counseling/discussion   . Hyperlipidemia   . CHF exacerbation (HCC) 09/23/2020  . Unstable angina (HCC) 09/22/2020  . LVAD (left ventricular assist device) present (HCC)   . 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 Cardiogenic shock (HCC)   . Acute respiratory failure (HCC)   . RUQ abdominal pain   . Cardiomyopathy (HCC) 11/21/2019  . Acute systolic CHF (congestive heart failure) (HCC) 11/21/2019  . History of alcohol abuse Quit 6 mos ago 11/21/2019  . History of recreational drug use 11/21/2019  . PAF (paroxysmal atrial fibrillation) (HCC)  11/21/2019  . Cardiac volume overload 11/21/2019  . Elevated brain natriuretic peptide (BNP) level 11/21/2019  . Leg edema, right 11/21/2019  . Hyponatremia 11/21/2019  . Abnormal electrocardiogram (ECG) (EKG) 11/21/2019  . Hyperglycemia 11/21/2019  . Tremulousness 11/21/2019  . Restlessness and agitation 11/21/2019  . OSA (obstructive sleep apnea) 11/21/2019  . Atrial fibrillation with RVR (HCC) 11/20/2019  . Acute congestive heart failure (HCC) 11/20/2019  . Acute  respiratory distress 11/20/2019  . COPD with acute exacerbation (HCC)   . Hypertension   . Type 2 diabetes mellitus (HCC)   . Closed fracture of 5th metacarpal 08/31/2012   PCP:  Kirstie Peri, MD Pharmacy:   [facility] [phone] - [place], [place] - [address] AT [facility] [address] [place] [address] Phone: [phone] Fax: [phone]  [facility] [place], [place] - [address] [place] [address] Phone: [phone] Fax: [phone]     Social Determinants of Health (SDOH) Interventions Food Insecurity Interventions: Intervention Not Indicated Financial Strain Interventions: Intervention Not Indicated Housing Interventions: Intervention Not Indicated Physical Activity Interventions: Other (Comments) (unable to excerice currently due to health status) Transportation Interventions: Intervention Not Indicated Alcohol Brief Interventions/Follow-up: AUDIT Score <7 follow-up not indicated  Readmission Risk Interventions Readmission Risk Prevention Plan 11/28/2020  Transportation Screening Complete  Medication Review Oceanographer) Complete  HRI or Home Care Consult Complete  SW Recovery Care/Counseling Consult Complete  Palliative Care Screening Not Applicable  Skilled Nursing Facility Not Applicable  Some recent data might be hidden

## 2020-11-28 NOTE — Progress Notes (Signed)
Occupational Therapy Treatment Patient Details Name: Kevin Underwood MRN: [id number] DOB: 1961/01/19 Today's Date: 11/28/2020    History of present illness Kevin Underwood is a 59 y.o. 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 Pt adm 12/01 with Acute on chronic systolic CHF with cardiogenic shock. Had impella placed 12/3. Underwent LVAD on 11/17/20.   OT comments  Pt progressing towards established OT goals. Providing education on sternal precautions, UB ADLs, LB ADLs, toileting, and LVAD management. Pt donning shirt demonstrating understanding of sternal precautions; cues for safety as pt frustrated and quickly pulling at gown and thus LVAD cords. Pt verbalizing understanding for LB dressing, toilet hygiene, and LVAD management. Pt very frustrated that he did not dc yesterday and avoiding eye contact and cursing throughout session. Continue to recommend dc to home and provided all education and answered questions in preparation for dc.      Follow Up Recommendations  Supervision/Assistance - 24 hour    Equipment Recommendations  Tub/shower seat;3 in 1 bedside commode    Recommendations for Other Services PT consult    Precautions / Restrictions Precautions Precautions: Fall;Sternal;Other (comment) (LVAD) Precaution Booklet Issued: Yes (comment) Precaution Comments: continued education re: driveline safety and securing system controller.  Cues for manipulating equipment. Pt has LVAD Restrictions Weight Bearing Restrictions: Yes LUE Weight Bearing: Non weight bearing Other Position/Activity Restrictions: Sternal precautions       Mobility Bed Mobility Overal bed mobility: Needs Assistance Bed Mobility: Rolling;Sidelying to Sit;Sit to Sidelying Rolling: Supervision Sidelying to sit: Supervision     Sit to sidelying:  Supervision General bed mobility comments: Supervision for safety  Transfers Overall transfer level: Needs assistance Equipment used: None Transfers: Sit to/from Stand Sit to Stand: Supervision         General transfer comment: No assist needed to stand and pt did place hands on knees without cues    Balance Overall balance assessment: Needs assistance Sitting-balance support: No upper extremity supported;Feet supported Sitting balance-Leahy Scale: Fair     Standing balance support: During functional activity;No upper extremity supported Standing balance-Leahy Scale: Fair Standing balance comment: can balance wihtout UE support                           ADL either performed or assessed with clinical judgement   ADL Overall ADL's : Needs assistance/impaired                                       General ADL Comments: Pt performing ADLs at Supervision level. Providing education on sternal precautions, UB ADLs, LB ADLs, toileting, and LVAD management. Pt verbalized understanding of compensatory techniques and VAD management. Also donning shirt. Very upset throughout session. At end of session, pt stating "lift my head up and give me my G-D- medicine."     Vision       Perception     Praxis      Cognition Arousal/Alertness: Awake/alert Behavior During Therapy: WFL for tasks assessed/performed Overall Cognitive Status: Within Functional Limits for tasks assessed  General Comments: Very frustrated today stating "I was lied to yesterday. They said I would go home. And I am still here. Still here!"        Exercises Exercises: General Lower Extremity   Shoulder Instructions       General Comments VSS on RA. Wife present throughout    Pertinent Vitals/ Pain       Pain Assessment: No/denies pain  Home Living                                          Prior  Functioning/Environment              Frequency  Min 2X/week        Progress Toward Goals  OT Goals(current goals can now be found in the care plan section)  Progress towards OT goals: Progressing toward goals  Acute Rehab OT Goals Patient Stated Goal: to get stronger OT Goal Formulation: With patient Time For Goal Achievement: 12/03/20 Potential to Achieve Goals: Good ADL Goals Pt Will Perform Grooming: with min guard assist;standing Pt Will Perform Upper Body Bathing: with set-up;with supervision;sitting Pt Will Perform Lower Body Bathing: with set-up;with supervision;sit to/from stand Pt Will Perform Upper Body Dressing: with set-up;with supervision;sitting Pt Will Perform Lower Body Dressing: with min guard assist;sit to/from stand;with adaptive equipment Pt Will Transfer to Toilet: with min guard assist;ambulating;regular height toilet;bedside commode Pt Will Perform Toileting - Clothing Manipulation and hygiene: with min guard assist;sit to/from stand Additional ADL Goal #1: Pt will be independent with precautions with ADLs Additional ADL Goal #2: Pt will be able to manage LVAD equipment during ADLs with supervision  Plan Discharge plan needs to be updated    Co-evaluation                 AM-PAC OT "6 Clicks" Daily Activity     Outcome Measure   Help from another person eating meals?: None Help from another person taking care of personal grooming?: A Little Help from another person toileting, which includes using toliet, bedpan, or urinal?: A Little Help from another person bathing (including washing, rinsing, drying)?: A Little Help from another person to put on and taking off regular upper body clothing?: A Little Help from another person to put on and taking off regular lower body clothing?: A Little 6 Click Score: 19    End of Session    OT Visit Diagnosis: Unsteadiness on feet (R26.81);Pain   Activity Tolerance Patient tolerated treatment well    Patient Left in bed;with call bell/phone within reach;with family/visitor present   Nurse Communication Mobility status        Time: 7741-2878 OT Time Calculation (min): 15 min  Charges: OT General Charges $OT Visit: 1 Visit OT Treatments $Self Care/Home Management : 8-22 mins  Marleni Gallardo MSOT, OTR/L Acute Rehab Pager: [phone] Office: [phone]   Theodoro Grist Keilly Fatula 11/28/2020, 10:36 AM

## 2020-11-28 NOTE — Progress Notes (Addendum)
Patient ID: Kevin Underwood, male   DOB: [birth year], 59 y.o.   MRN: [id number]     Advanced Heart Failure Rounding Note  PCP-Cardiologist: Nona Dell, MD   Subjective:    - 12/2 Had cath as noted below with intact revascularization. CO-OX 39%, started on milrinone.  - 12/3 Impella 5.5 placed - 12/6 Impella speed increased to P8. Started on lasix drip and given metolazone. Brisk diuresis noted. Negative 5 liters.  - 12/8 Swan removed, PICC placed pre-LVAD.  - 12/10 Impella removed, Heartmate 3 LVAD placed.  - 12/11 Extubated - 12/15 Ramp echo (difficult images): Speed left at 5500  POD #10 VAD.   Off milrinone. Co-ox 55% today. Volume status stable. No dyspnea.   EPW removed yesterday.  Coumadin resumed last PM. Hgb 10.5>>9.8.  INR 1.6 Refused CXR this am.   BMP WNL. Dig level pending.   Eager to go home. Main complaint is low back pain from sleeping in hospital bed.   LVAD Interrogation HM 3: Speed: 5500 Flow: 4.3  PI: 2.7  Power: 4.1  7 PI events  VAD interrogated personally. Parameters stable.  Objective:   Weight Range: 79.6 kg Body mass index is 25.18 kg/m.   Vital Signs:   Temp:  [98.4 F (36.9 C)] 98.4 F (36.9 C) (12/20 1948) Pulse Rate:  [95-96] 95 (12/20 2014) Resp:  [16-21] 19 (12/21 0300) BP: (90)/(79) 90/79 (12/20 2014) SpO2:  [97 %] 97 % (12/20 2014) Last BM Date: 11/23/20  Weight change: Filed Weights   11/25/20 0420 11/26/20 0500 11/27/20 0500  Weight: 79.4 kg 79.9 kg 79.6 kg    Intake/Output:  No intake or output data in the 24 hours ending 11/28/20 0716    Physical Exam   General:  Well appearing. NAD HEENT: normal  Neck: supple. JVP 7.  Carotids 2+ bilat; no bruits. No lymphadenopathy or thryomegaly appreciated. Cor: sternal wound ok  LVAD hum.  EPW removed, dressing clean. No visible drainage Lungs: clear bilaterally  Abdomen: obese soft, nontender, non-distended. No hepatosplenomegaly. No bruits or masses. Good bowel sounds.  Driveline site clean. Anchor in place.  Extremities: no cyanosis, clubbing, rash. Warm and dry. No edema  Neuro: alert & oriented x 3. No focal deficits. Moves all 4 without problem  erves grossly intact. moves all 4 extremities w/o difficulty. Affect pleasant   Telemetry   NSR 80s, transient afib overnight Personally reviewed  Labs    CBC Recent Labs    11/27/20 0504 11/28/20 0500  WBC 11.3* 9.6  HGB 10.5* 9.8*  HCT 32.6* 29.7*  MCV 87.6 87.6  PLT 354 377   Basic Metabolic Panel Recent Labs    71/06/26 0540 11/27/20 0504 11/28/20 0500  NA 134* 134* 135  K 3.9 3.9 3.6  CL 98 98 100  CO2 27 26 26   GLUCOSE 100* 98 125*  BUN 14 12 11   CREATININE 0.58* 0.60* 0.56*  CALCIUM 8.7* 8.7* 8.6*  MG 2.0  --   --    Liver Function Tests No results for input(s): AST, ALT, ALKPHOS, BILITOT, PROT, ALBUMIN in the last 72 hours. No results for input(s): LIPASE, AMYLASE in the last 72 hours. Cardiac Enzymes No results for input(s): CKTOTAL, CKMB, CKMBINDEX, TROPONINI in the last 72 hours.  BNP: BNP (last 3 results) Recent Labs    11/08/20 1344 11/18/20 0327 11/24/20 0045  BNP 910.0* 392.0* 252.0*    ProBNP (last 3 results) No results for input(s): PROBNP in the last 8760 hours.  D-Dimer No results for input(s): DDIMER in the last 72 hours. Hemoglobin A1C No results for input(s): HGBA1C in the last 72 hours. Fasting Lipid Panel No results for input(s): CHOL, HDL, LDLCALC, TRIG, CHOLHDL, LDLDIRECT in the last 72 hours. Thyroid Function Tests No results for input(s): TSH, T4TOTAL, T3FREE, THYROIDAB in the last 72 hours.  Invalid input(s): FREET3  Other results:   Imaging    No results found.   Medications:     Scheduled Medications: . amiodarone  200 mg Oral BID  . aspirin EC  81 mg Oral Daily  . bisacodyl  10 mg Oral Daily   Or  . bisacodyl  10 mg Rectal Daily  . Chlorhexidine Gluconate Cloth  6 each Topical Daily  . colchicine  0.6 mg Oral BID  .  coumadin book   Does not apply Once  . digoxin  0.125 mg Oral Daily  . docusate sodium  200 mg Oral Daily  . feeding supplement (GLUCERNA SHAKE)  237 mL Oral TID BM  . furosemide  40 mg Oral Daily  . insulin aspart  2-6 Units Subcutaneous TID AC & HS  . insulin detemir  10 Units Subcutaneous Q12H  . ipratropium-albuterol  3 mL Nebulization BID  . lidocaine  1 patch Transdermal Q24H  . mometasone-formoterol  2 puff Inhalation BID  . multivitamin with minerals  1 tablet Oral Daily  . pantoprazole  40 mg Oral QHS  . potassium chloride  20 mEq Oral Daily  . QUEtiapine  25 mg Oral QHS  . rosuvastatin  40 mg Oral q1800  . sacubitril-valsartan  1 tablet Oral BID  . sodium chloride flush  10-40 mL Intracatheter Q12H  . sodium chloride flush  3 mL Intravenous Q12H  . spironolactone  12.5 mg Oral Daily  . warfarin  4 mg Oral q1600  . Warfarin - Pharmacist Dosing Inpatient   Does not apply q1600    Infusions: . sodium chloride    . sodium chloride Stopped (11/21/20 2144)  . lactated ringers 10 mL/hr at 11/17/20 0700  . lactated ringers    . lactated ringers Stopped (11/21/20 2142)    PRN Medications: acetaminophen, albuterol, alum & mag hydroxide-simeth, aminocaproic acid, dextrose, haloperidol lactate, ondansetron (ZOFRAN) IV, oxyCODONE, sodium chloride flush, sodium chloride flush, sodium chloride flush, traMADol    Patient Profile   Kevin Underwood a 59 y.o.malewith a history of systolic HF, 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 type 2 diabetes mellitus, COPD, and hypertension. Now s/p Heartmate 3 LVAD.   Assessment/Plan   1. Acute on chronic systolic CHF with cardiogenic shock: Echo 10/21 with EF 20-25%, mildly decreased RV function (persistently low). LHC/RHC this admission with patent grafts, low output. Suspect mixed  ischemic/nonischemic cardiomyopathy (prior heavy ETOH and drugs as well as CAD).  No ETOH, drugs, smoking for about 1 year since CABG in 12/20. NYHA class IV at admission.  Had placement of Impella 5.5 initially, now s/p Heartmate 3 LVAD on 12/10.  Off milrinone. Co-ox 55% - Volume status ok. Continue PO Lasix 40 mg.  - Continue Spiro 12.5 mg daily  - Continue Entresto 24-26 BID - Continue digoxin 0.125. Dig level pending - Continue ASA 81 mg daily.  - Continue Coumadin. INR 1.6. Discussed dosing with PharmD personally. - VAD interrogated personally. Parameters stable. LDH 190  - Has completed VAD teaching   2.  Acute hypoxemic respiratory failure: Now extubated and stable.  3. CAD: S/p CABG 12/20.  LHC this admission with patent grafts, no target for intervention. - Continue ASA and statin.  - No s/s angina 4. Atrial fibrillation: Paroxysmal.  S/p Maze and LA appendage clip with CABG in 12/20.  Back in NSR today. Transient Afib overnight - Continue PO amio 200 mg bid.   - Will be on lifelong anticoagulation w/ VAD. Continue Coumadin  5. Type 2 diabetes: A1c 10.4, poor control.  Continue insulin.  6. Prior ETOH, drugs: None x 1 year.  7. Anemia: Post-op, transfuse hgb < 8.  CBC pending   8. COPD: PFTs showed FEV1 1.79 (limited study due to LVAD).   Plan d/c home today. Will arrange post hospital f/u and INR check in VAD clinic    Length of Stay: [address] Delmer Islam  11/28/2020, 7:16 AM  Advanced Heart Failure Team Pager [phone] (M-F; 7a - 4p)  Please contact [facility] for night-coverage after hours (4p -7a ) and weekends on [email or web address]  Patient seen and examined with the above-signed Advanced Practice Provider and/or Housestaff. I personally reviewed laboratory data, imaging studies and relevant notes. I independently examined the patient and formulated the important aspects of the plan. I have edited the note to reflect any of my changes or salient points. I have  personally discussed the plan with the patient and/or family.  Pacing wires pulled yesterday without complication. He is stable for d/c. INR 1.6. VAD interrogated personally. Parameters stable.  General:  NAD.  HEENT: normal  Neck: supple. JVP not elevated.  Carotids 2+ bilat; no bruits. No lymphadenopathy or thryomegaly appreciated. Cor: LVAD hum.  Lungs: Clear. Abdomen: obese soft, nontender, non-distended. No hepatosplenomegaly. No bruits or masses. Good bowel sounds. Driveline site clean. Anchor in place.  Extremities: no cyanosis, clubbing, rash. Warm no edema  Neuro: alert & oriented x 3. No focal deficits. Moves all 4 without problem   Ok for d/c today. Meds discussed. F/u VAD clinic 1 week.   Arvilla Meres, MD  9:33 AM

## 2020-11-28 NOTE — Progress Notes (Signed)
CSW met with patient at bedside. Patient shared that he has not slept well in the past 4 nights and looking forward to going home. Patient appeared quite irritable and ready to go home. CSW provided supportive intervention and contact info for follow up in the [facility] post discharge. CSW available as needed. Raquel Sarna, [place], [place]

## 2020-11-28 NOTE — Progress Notes (Signed)
Pt refusing LVAD assessment, vitals, and chest x-ray this am. RN documented LVAD parameters available on monitor. Will continue to monitor.

## 2020-11-28 NOTE — TOC Transition Note (Signed)
Transition of Care [facility]) - CM/SW Discharge Note   Patient Details  Name: Kevin Underwood MRN: [id number] Date of Birth: Oct 30, 1961  [facility] Care [facility]) CM/SW Contact:  Leone Haven, RN Phone Number: 11/28/2020, 12:01 PM   Clinical Narrative:    NCM spoke with wife, she states patient does not want to speak with anyone .  NCM asked if he would like HHPT/HHOT/HHRN set up , he states no.  He does not need any DME per wife. He has follow up apt next per wife. She has supplies for dressing changes   Final next level of care: Home/Self Care Barriers to Discharge: No Barriers Identified   Patient Goals and CMS Choice        Discharge Placement                       Discharge Plan and Services   Discharge Planning Services: CM Consult Post Acute Care Choice: NA            DME Agency: NA       HH Arranged: Refused HH          Social Determinants of Health (SDOH) Interventions Food Insecurity Interventions: Intervention Not Indicated Financial Strain Interventions: Intervention Not Indicated Housing Interventions: Intervention Not Indicated Physical Activity Interventions: Other (Comments) (unable to excerice currently due to health status) Transportation Interventions: Intervention Not Indicated Alcohol Brief Interventions/Follow-up: AUDIT Score <7 follow-up not indicated   Readmission Risk Interventions Readmission Risk Prevention Plan 11/28/2020  Transportation Screening Complete  Medication Review Oceanographer) Complete  HRI or Home Care Consult Complete  SW Recovery Care/Counseling Consult Complete  Palliative Care Screening Not Applicable  Skilled Nursing Facility Not Applicable  Some recent data might be hidden

## 2020-11-28 NOTE — Care Management Important Message (Signed)
Important Message  Patient Details  Name: Kevin Underwood MRN: [id number] Date of Birth: June 10, 1961   Medicare Important Message Given:  Yes Patient left prior to IM delivery. IM mailed to the patient home address.   Channing Savich 11/28/2020, 2:30 PM

## 2020-11-28 NOTE — Care Management Important Message (Signed)
Important Message  Patient Details  Name: Kevin Underwood MRN: [id number] Date of Birth: 03/01/1961   Medicare Important Message Given:  Yes     Rosamary Boudreau 11/28/2020, 2:30 PM

## 2020-11-28 NOTE — Progress Notes (Signed)
Discussed IS, sternal precautions, walking/exercise, DM control importance and CRPII. Pt and wife nodded understanding. He sts he is not interested in CRPII due to drive.  [id number] Ethelda Chick CES, ACSM 11:01 AM 11/28/2020

## 2020-11-28 NOTE — Progress Notes (Signed)
ANTICOAGULATION CONSULT NOTE   Pharmacy Consult for Warfarin  Indication: atrial fibrillation, new Heart Mate III LVAD implant   No Known Allergies  Patient Measurements: Height: 5\' 10"  (177.8 cm) Weight:  (pt. refused wt.) IBW/kg (Calculated) : 73 Heparin Dosing Weight: 82kg  Vital Signs: BP: 89/76 (12/21 0832)  Labs: Recent Labs    11/26/20 0540 11/27/20 0504 11/28/20 0500  HGB 10.2* 10.5* 9.8*  HCT 32.2* 32.6* 29.7*  PLT 345 354 377  LABPROT 22.4* 20.6* 18.1*  INR 2.1* 1.8* 1.6*  CREATININE 0.58* 0.60* 0.56*    Estimated Creatinine Clearance: 102.7 mL/min (A) (by C-G formula based on SCr of 0.56 mg/dL (L)).   Medical History: Past Medical History:  Diagnosis Date  . Arthritis   . CAD (coronary artery disease)    a. s/p CABG in 11/2019 with LIMA-LAD, SVG-OM1, SVG-PDA and SVG-D1  . CHF (congestive heart failure) (HCC)    a. EF < 20% by echo in 11/2019  . COPD (chronic obstructive pulmonary disease) (HCC)   . Essential hypertension   . PAF (paroxysmal atrial fibrillation) (HCC)   . Type 2 diabetes mellitus (HCC)      Assessment: 43 yoM admitted with CP and cardiogenic shock. Pt has hx AFib with no AC PTA, pharmacy to start IV heparin. Impella 5.5 placed 12/3 followed by HM3 LVAD 12/10. Warfarin started 12/12 post LVAD implant.   CBC stable, LDH stable. No overt bleeding noted.   Coumadin held 12/19 in anticipation of pulling pacing wires, resumed 12/20.  INR down to 1.6 today.  No overt bleeding or complications noted.  Planning discharge today.  Goal of Therapy:  INR goal 2-2.5 Monitor platelets by anticoagulation protocol: Yes   Plan:  Continue warfarin 4 mg daily at discharge.  Will instruct patient to take 4 mg daily at discharge until INR fullow up next Monday. Daily INR, CBC, s/sx bleeding Will provide warfarin education later today.  Tuesday, Reece Leader, BCCP Clinical Pharmacist  11/28/2020 10:12 AM   [facility] pharmacy phone numbers are  listed on [email or web address]

## 2020-11-28 NOTE — Progress Notes (Signed)
LVAD Coordinator Rounding Note:  Admitted 11/08/20 due to cardiogenic shock.   HM 3 LVAD implanted on 11/17/20 by [facility]A under DT criteria.  Pt sitting up in chair, wife in room. Nurse reported patient is very upset, refused multiple interventions last night including doppler BP check and other V/S checks. Pt angry this am and wants to go home.   Plan to d/c PICC line this am and get CXR. Temporary pacing wires pulled yesterday.    Patient's home equipment delivered and and set up in room including:   one MPU with patient cable one universal Magazine features editor - [facility] loaner [phone] eight fully charged batteries  four battery clips  one travel case  one holster vest  14 daily dressing kits  6 anchors Back up system controller    Vital signs: Tmax: 98.4 HR: 74 NSR with PACs Doppler Pressure: 114 Automatic BP: 100/80 (89) O2 Sat: 97 on RA Wt:  199.9>189.6>177.4>178.1>180.7>175>176>175.5 lbs   LVAD interrogation reveals:  Speed: 5500 Flow: 4.2 Power:  4.1w  PI: 3.6 Alarms: none Events:  7 PI events on 12/20 Hematocrit: 33  Fixed speed: 5500 Low speed limit: 5200   Drive Line: Existing VAD dressing removed and site care performed using sterile technique by wife-observed by VAD coordinator. Drive line exit site cleaned with Chlora prep applicators x 2, allowed to dry, and gauze dressing with silver strip re-applied. Exit site partially incorporated with small bloody scab, the velour is fully implanted at exit site. Suture intact. Slight redness, no tenderness, drainage, foul odor or rash noted. Drive line anchor secure x 2. Will continue every other day dressing changes at home; dressing supplies given to patient/wife.     Labs:  LDH trend: 297>340>260>235>228>222  INR trend: 1.3>1.4>1.6>1.8>1.7>1.6  Anticoagulation Plan: -INR Goal: 2-2.5 -ASA Dose: 81 mg d/c date of 12/18/20  Blood Products:  Intra-op 11/17/20: 4 FFP 2 PLT 2 PRBC 2 Cryo 380 cc of cell saver  Post  op: 11/19/20>> 1 u/PRBC  Device: -NONE  Respiratory: Extubated 11/18/20  Gtts: Milrinone 0.125 mcg/kg/min-off Lasix 6 mg/hr-off Amiodarone 30 mg/hr--off Heparin 500 u/hr  Adverse Events on VAD:  Patient Education: 1. Wife performed dressing change today for the 4th time with very few verbal cues from VAD coordinator. She did very well. She is checked off to do dressing with bedside nurse. 2. Continuing education: >>12/13- Pt and caregiver taught how to change from PM to batteries. Pt/caregiver successfully demonstrated switching power sources. >>12/14- pt and caregiver educated on controller - including all soft keys, uses and internal backup battery. Pt demonstrated self test today. >>12/16- pt and wife state they feel "very comfortable" with power source changes, the controller, and performing self test. Pt reports he is tired and would like to rest today.  >>12/17-pt and wife educated on UBC and MPU including maintenance, charging batteries and battery rotation. >>11/28/20-home equipment delivered and set up in patients' room. Reviewed using three prong grounded outlets, keeping flashlight at bedside for power outages. Encouraged him to call VAD pager if any questions or concerns.  Appointment card along with garage code given to pt's wife for f/u visit Monday, 12/04/20 at 9:00 am   Plan/Recommendations:  1. Patient to be discharged home today with equipment left at bedside. 2. Call VAD coordinator for any equipment issues.   Hessie Diener RN VAD Coordinator  Office: [phone]  24/7 Pager: [phone]

## 2020-11-28 NOTE — Discharge Summary (Addendum)
Advanced Heart Failure Team  Discharge Summary   Patient ID: Kevin Underwood MRN: [id number], DOB/AGE: 03/25/1961 59 y.o. Admit date: 11/08/2020 D/C date:     11/28/2020   Primary Discharge Diagnoses:  Acute on Chronic Systolic Heart Failure w/ Cardiogenic Shock  S/p HeartMate III LVAD implanted 11/18/20 Acute Hypoxic Respiratory Failure Requiring Intubation  CAD w/ H/o CABG Paroxysmal Atrial Fibrillation  Acute Blood Loss Anemia (post surgery) requiring transfusion  Type 2DM  CODP Hypertension    Hospital Course:   Kevin Underwood is a 59 y.o. 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 type 2 diabetes mellitus, COPD, and hypertension.    Echo 10/21 LVEF was 20 to 25% with global hypokinesis, mild RV dysfunction, moderate biatrial enlargement, moderate mitral regurgitation and tricuspid regurgitation. He has not had follow-up in the heart failure clinic other than a telehealth encounter back in January.  Presented to [facility] ER on 11/30 with increased SOB and chest tightness. Had been struggling at home with progressive fatigue and SOB. NYHA III-IIIb symptoms. HsTRop negative. Transferred to [facility] for cath on 12/2. Cath with severe 2vCAD with patent grafts and hemodynamics c/w cardiogenic shock. Initial Co-ox 39%. CI 1.5. PCWP 29. Started on milrinone and diuresed w/ lasix. Required additional support and Impella 5.5 was placed on 12/3. Felt to need durable VAD. Prior to implant, underwent multiple dental extractions. Underwent placement of a HM3 LVAD on 12/11. Had post operative atrial fibrillation treated w/ amiodarone but post op recovery otherwise unremarkable. Had expected acute blood loss anemia from surgery and was transfused and hgb remained stable.  Weaned off pressors w/ stable co-ox. Started on coumadin w/ heparin  bridge. Transferred out of CCU to progressive care unit and worked w/ PT. VAD parameters remained stable. VAD teaching provided to patient and family.   On 12/21, he was last seen and examined by Dr.  and felt stable for d/c home. VAD clinic f/u arranged for 12/27.    Cardiac Studies  RHC/LHC 11/09/20  RA 20  PCWP 30-35 PA Sat 40% CO 3 CI 1.5.  Severe native coronary artery disease, similar to prior catheterization in 11/2019.  Widely patent LIMA-LAD, SVG-D1, and sequential SVG-OM3-rPDA. Severely elevated left and right heart filling pressures. Severely reduced cardiac output/index. Successful placement of leave-in PA catheter via the right internal jugular vein.   LVAD Interrogation HM 3: Speed: 5500 Flow: 4.3  PI: 2.7  Power: 4.1  7 PI events  VAD interrogated personally. Parameters stable.  Discharge Weight Range: 175 lb  Discharge Vitals: Blood pressure (!) 89/76, pulse 95, temperature 98.4 F (36.9 C), temperature source Oral, resp. rate 20, height 5' 10" (1.778 m), weight 79.6 kg, SpO2 97 %.  Labs: Lab Results  Component Value Date   WBC 9.6 11/28/2020   HGB 9.8 (L) 11/28/2020   HCT 29.7 (L) 11/28/2020   MCV 87.6 11/28/2020   PLT 377 11/28/2020    Recent Labs  Lab 11/28/20 0500  NA 135  K 3.6  CL 100  CO2 26  BUN 11  CREATININE 0.56*  CALCIUM 8.6*  GLUCOSE 125*   Lab Results  Component Value Date   CHOL 102 11/10/2020   HDL 40 (L) 11/10/2020   LDLCALC 53 11/10/2020   TRIG 69 11/20/2020   BNP (last 3 results) Recent Labs    11/08/20 1344 11/18/20   0327 11/24/20 0045  BNP 910.0* 392.0* 252.0*    ProBNP (last 3 results) No results for input(s): PROBNP in the last 8760 hours.   Diagnostic Studies/Procedures   DG Chest 2 View  Result Date: 11/28/2020 CLINICAL DATA:  LVAD present EXAM: CHEST - 2 VIEW COMPARISON:  11/24/2020 FINDINGS: Right PICC line and LVAD are again identified. Small left pleural effusion with adjacent atelectasis. No  pneumothorax. Stable cardiomediastinal contours. IMPRESSION: Small left pleural effusion with adjacent atelectasis. Electronically Signed   By: Macy Mis M.D.   On: 11/28/2020 08:32    Discharge Medications   Allergies as of 11/28/2020   No Known Allergies      Medication List     STOP taking these medications    carvedilol 6.25 MG tablet Commonly known as: COREG   clopidogrel 75 MG tablet Commonly known as: PLAVIX       TAKE these medications    albuterol (2.5 MG/3ML) 0.083% nebulizer solution Commonly known as: PROVENTIL Take 2.5 mg by nebulization every 6 (six) hours as needed for wheezing or shortness of breath.   amiodarone 200 MG tablet Commonly known as: PACERONE Take 1 tablet (200 mg total) by mouth daily.   aspirin EC 81 MG tablet Take 81 mg by mouth daily. Swallow whole.   blood glucose meter kit and supplies Kit Dispense based on patient and insurance preference. Use to check CBG's three times a day. (FOR ICD-9 250.00, 250.01).   colchicine 0.6 MG tablet Take 1 tablet (0.6 mg total) by mouth 2 (two) times daily.   Combivent Respimat 20-100 MCG/ACT Aers respimat Generic drug: Ipratropium-Albuterol Inhale 1 puff into the lungs every 6 (six) hours as needed.   dapagliflozin propanediol 5 MG Tabs tablet Commonly known as: FARXIGA Take 1 tablet (5 mg total) by mouth daily before breakfast.   digoxin 0.125 MG tablet Commonly known as: LANOXIN TAKE 1 TABLET(0.125 MG) BY MOUTH DAILY What changed: See the new instructions.   Entresto 24-26 MG Generic drug: sacubitril-valsartan TAKE 1 TABLET BY MOUTH TWICE DAILY   furosemide 40 MG tablet Commonly known as: LASIX Take 1 tablet (40 mg total) by mouth daily. What changed:  medication strength how much to take when to take this additional instructions   gabapentin 600 MG tablet Commonly known as: NEURONTIN Take 1 tablet (600 mg total) by mouth 3 (three) times daily.   magnesium oxide 400  (241.3 Mg) MG tablet Commonly known as: MAG-OX Take 1 tablet (400 mg total) by mouth daily.   metFORMIN 500 MG tablet Commonly known as: GLUCOPHAGE Take 2 tablets (1,000 mg total) by mouth 2 (two) times daily with a meal.   mometasone-formoterol 200-5 MCG/ACT Aero Commonly known as: DULERA Inhale 2 puffs into the lungs in the morning and at bedtime.   multivitamin with minerals Tabs tablet Take 1 tablet by mouth daily.   OLANZapine 5 MG tablet Commonly known as: ZYPREXA Take 5 mg by mouth at bedtime.   pantoprazole 40 MG tablet Commonly known as: PROTONIX Take 1 tablet (40 mg total) by mouth daily.   potassium chloride SA 20 MEQ tablet Commonly known as: KLOR-CON Take 2 tablets (40 mEq total) by mouth daily. What changed: when to take this   REDNESS RELIEVER EYE DROPS OP Apply 2 drops to eye daily as needed.   rosuvastatin 40 MG tablet Commonly known as: CRESTOR Take 1 tablet (40 mg total) by mouth daily.   spironolactone 25 MG tablet Commonly known as: Aldactone Take 0.5 tablets (  12.5 mg total) by mouth daily.   traZODone 150 MG tablet Commonly known as: DESYREL Take 150 mg by mouth at bedtime.   warfarin 4 MG tablet Commonly known as: COUMADIN Take 1 tablet (4 mg total) by mouth daily at 4 PM.        Disposition   The patient will be discharged in stable condition to home. Discharge Instructions     AMB Referral to Cardiac Rehabilitation - Phase II   Complete by: As directed    Diagnosis: Other   After initial evaluation and assessments completed: Virtual Based Care may be provided alone or in conjunction with Phase 2 Cardiac Rehab based on patient barriers.: Yes       Follow-up Information     Schedule an appointment as soon as possible for a visit with Owsley, Madison B, DMD.   Specialty: Dentistry Why: As needed, For suture removal Contact information: [address] [place] [address] [phone]         [facility] Follow up on 12/04/2020.   Specialty: Cardiology Why: [facility] (VAD Clinic) 9:00 AM  [facility], Entrance C Parking Garage Code 3009 Contact information: [address] [id number] [place] [address] [phone]                  Duration of Discharge Encounter: Greater than 35 minutes   Signed, Brittainy Simmons, PA-C  11/28/2020, 3:09 PM   Patient seen and examined with the above-signed Advanced Practice Provider and/or Housestaff. I personally reviewed laboratory data, imaging studies and relevant notes. I independently examined the patient and formulated the important aspects of the plan. I have edited the note to reflect any of my changes or salient points. I have personally discussed the plan with the patient and/or family.  Agree with above. He is stable for d/c with close f/u in HF Clinic. See today's rounding note for further details.    , MD  6:33 PM 

## 2020-11-29 ENCOUNTER — Encounter: Payer: Self-pay | Admitting: *Deleted

## 2020-11-29 ENCOUNTER — Encounter (HOSPITAL_COMMUNITY): Payer: Self-pay | Admitting: *Deleted

## 2020-11-29 ENCOUNTER — Other Ambulatory Visit (HOSPITAL_COMMUNITY): Payer: Self-pay | Admitting: Unknown Physician Specialty

## 2020-11-29 DIAGNOSIS — Z7901 Long term (current) use of anticoagulants: Secondary | ICD-10-CM

## 2020-11-29 DIAGNOSIS — Z95811 Presence of heart assist device: Secondary | ICD-10-CM

## 2020-11-29 DIAGNOSIS — I5023 Acute on chronic systolic (congestive) heart failure: Secondary | ICD-10-CM

## 2020-12-04 ENCOUNTER — Ambulatory Visit (HOSPITAL_COMMUNITY)
Admit: 2020-12-04 | Discharge: 2020-12-04 | Disposition: A | Discharge status: Home / Self Care | Payer: 59 | Source: Ambulatory Visit | Attending: Cardiology | Admitting: Cardiology

## 2020-12-04 ENCOUNTER — Ambulatory Visit (HOSPITAL_COMMUNITY): Payer: Self-pay | Admitting: Pharmacist

## 2020-12-04 ENCOUNTER — Other Ambulatory Visit: Payer: Self-pay

## 2020-12-04 ENCOUNTER — Encounter (HOSPITAL_COMMUNITY): Payer: Self-pay

## 2020-12-04 VITALS — BP 84/61 | HR 104 | Temp 97.7°F | Wt 172.2 lb

## 2020-12-04 DIAGNOSIS — J449 Chronic obstructive pulmonary disease, unspecified: Secondary | ICD-10-CM | POA: Diagnosis not present

## 2020-12-04 DIAGNOSIS — I2510 Atherosclerotic heart disease of native coronary artery without angina pectoris: Secondary | ICD-10-CM | POA: Insufficient documentation

## 2020-12-04 DIAGNOSIS — I110 Hypertensive heart disease with heart failure: Secondary | ICD-10-CM | POA: Diagnosis not present

## 2020-12-04 DIAGNOSIS — I5022 Chronic systolic (congestive) heart failure: Secondary | ICD-10-CM

## 2020-12-04 DIAGNOSIS — Z951 Presence of aortocoronary bypass graft: Secondary | ICD-10-CM | POA: Insufficient documentation

## 2020-12-04 DIAGNOSIS — Z7982 Long term (current) use of aspirin: Secondary | ICD-10-CM | POA: Diagnosis not present

## 2020-12-04 DIAGNOSIS — Z7951 Long term (current) use of inhaled steroids: Secondary | ICD-10-CM | POA: Insufficient documentation

## 2020-12-04 DIAGNOSIS — E119 Type 2 diabetes mellitus without complications: Secondary | ICD-10-CM | POA: Diagnosis not present

## 2020-12-04 DIAGNOSIS — M25512 Pain in left shoulder: Secondary | ICD-10-CM | POA: Diagnosis not present

## 2020-12-04 DIAGNOSIS — Z452 Encounter for adjustment and management of vascular access device: Secondary | ICD-10-CM | POA: Diagnosis not present

## 2020-12-04 DIAGNOSIS — Z7984 Long term (current) use of oral hypoglycemic drugs: Secondary | ICD-10-CM | POA: Diagnosis not present

## 2020-12-04 DIAGNOSIS — Z95811 Presence of heart assist device: Secondary | ICD-10-CM | POA: Diagnosis not present

## 2020-12-04 DIAGNOSIS — Z79899 Other long term (current) drug therapy: Secondary | ICD-10-CM | POA: Insufficient documentation

## 2020-12-04 DIAGNOSIS — K08109 Complete loss of teeth, unspecified cause, unspecified class: Secondary | ICD-10-CM | POA: Insufficient documentation

## 2020-12-04 DIAGNOSIS — I480 Paroxysmal atrial fibrillation: Secondary | ICD-10-CM | POA: Insufficient documentation

## 2020-12-04 DIAGNOSIS — Z79891 Long term (current) use of opiate analgesic: Secondary | ICD-10-CM | POA: Diagnosis not present

## 2020-12-04 DIAGNOSIS — M7918 Myalgia, other site: Secondary | ICD-10-CM | POA: Insufficient documentation

## 2020-12-04 DIAGNOSIS — M5489 Other dorsalgia: Secondary | ICD-10-CM | POA: Diagnosis not present

## 2020-12-04 DIAGNOSIS — Z7901 Long term (current) use of anticoagulants: Secondary | ICD-10-CM | POA: Diagnosis not present

## 2020-12-04 LAB — CBC
HCT: 36.1 % — ABNORMAL LOW (ref 39.0–52.0)
Hemoglobin: 12.3 g/dL — ABNORMAL LOW (ref 13.0–17.0)
MCH: 28.3 pg (ref 26.0–34.0)
MCHC: 34.1 g/dL (ref 30.0–36.0)
MCV: 83.2 fL (ref 80.0–100.0)
Platelets: 384 10*3/uL (ref 150–400)
RBC: 4.34 MIL/uL (ref 4.22–5.81)
RDW: 14.6 % (ref 11.5–15.5)
WBC: 9.9 10*3/uL (ref 4.0–10.5)
nRBC: 0 % (ref 0.0–0.2)

## 2020-12-04 LAB — BASIC METABOLIC PANEL
Anion gap: 14 (ref 5–15)
BUN: 11 mg/dL (ref 6–20)
CO2: 22 mmol/L (ref 22–32)
Calcium: 9.1 mg/dL (ref 8.9–10.3)
Chloride: 95 mmol/L — ABNORMAL LOW (ref 98–111)
Creatinine, Ser: 0.73 mg/dL (ref 0.61–1.24)
GFR, Estimated: >60 mL/min (ref >60)
Glucose, Bld: 176 mg/dL — ABNORMAL HIGH (ref 70–99)
Potassium: 4.1 mmol/L (ref 3.5–5.1)
Sodium: 131 mmol/L — ABNORMAL LOW (ref 135–145)

## 2020-12-04 LAB — PROTIME-INR
INR: 2.4 — ABNORMAL HIGH (ref 0.8–1.2)
Prothrombin Time: 25 seconds — ABNORMAL HIGH (ref 11.4–15.2)

## 2020-12-04 LAB — LACTATE DEHYDROGENASE: LDH: 194 U/L — ABNORMAL HIGH (ref 98–192)

## 2020-12-04 MED ORDER — CYCLOBENZAPRINE HCL 5 MG PO TABS: 5.0000 mg | ORAL_TABLET | Freq: Two times a day (BID) | ORAL | 0 refills | Status: DC | PRN

## 2020-12-04 MED ORDER — COLCHICINE 0.6 MG PO TABS: 0.6000 mg | ORAL_TABLET | ORAL | 3 refills | Status: DC | PRN

## 2020-12-04 MED ORDER — FUROSEMIDE 40 MG PO TABS: 20.0000 mg | ORAL_TABLET | Freq: Every day | ORAL | 5 refills | Status: DC | PRN

## 2020-12-04 NOTE — Progress Notes (Signed)
LVAD INR 

## 2020-12-04 NOTE — Progress Notes (Addendum)
Patient presents for hospital d/c follow up in [facility] today with his wife [name]. Reports no problems with VAD equipment or concerns with drive line.  Reports that he is feeling "fine" just "very tired." He is having trouble sleeping, and staying asleep. Currently takes Trazodone 150 mg q HS with Tramadol. He reports left shoulder pain, that makes it difficult to sleep. Per Dr Aundra Dubin, will start PRN Flexeril 5  10 mg BID PRN for muscle pain.   Reports mild lightheadedness "if I stand up, or change positions too fast" but otherwise denies dizziness or falls. Reports he is "trying to drink 2 L a day." He is mildly short of breath with activity. Denies smoking. Vitals and VAD numbers as below. Per Dr Aundra Dubin will stop Spironolactone and Lasix. He may take Lasix 20 mg PRN for fluid retention or shortness of breath he attributes to fluid retention.  Reports poor appetite- "nothing tastes good, and I can't eat much without teeth." Instructed patient to contact his dental office re: denture fitting once he is healed from extractions.   Reports that the day he went home "I contract a stomach bug and threw up for 24 hours, but I feel better now." Reports they had attempted to page VAD coordinator; no pages received. Had them practice paging while in clinic; page successfully went through. [name] states "we have bad cell service at the house" and attributes this to why VAD coordinator did not received pages.   Will stop Colchicine today per Dr Aundra Dubin as he is not having a gout flair. He may use this PRN.   Remaining 3 chest tube sutures and drive line suture removed today using sterile technique.  Patient forgot to bring back loaner U[employer]. Requested he bring equipment to his appointment next week.    ASA stop date: 12/18/20  Vital Signs:  Temp: 97.7 Doppler Pressure: 74 Automatc BP: 84/61 (69) HR: 104 ST w/ PVCs SPO2: 100 %  Weight: 172.2 lb w/ eqt Last weight:  lb  Hospital d/c weight: 175  lbs Home weights: 169 - 172 lbs   VAD Indication: Destination Therapy. HM III implanted 11/17/20 by Dr Orvan Seen.  VAD interrogation: Speed: 5500 Flow: 3.8 Power: 4.0w    PI: 2.2 Hct: 33  Alarms: no clinical alarms  Events: 70+ daily  Fixed speed: 5500 Low speed limit: 5200  Primary controller back up battery due for replacement in 28 months. Back up controller back up battery due for change in  months.   I reviewed the LVAD parameters from today and compared the results to the patient's prior recorded data. LVAD interrogation was NEGATIVE for significant power changes, NEGATIVE for clinical alarms and STABLE for PI events/speed drops. No programming changes were made and pump is functioning within specified parameters. Pt is performing daily controller and system monitor self tests along with completing weekly and monthly maintenance for LVAD equipment.  LVAD equipment check completed and is in good working order. Back-up equipment present. Charged back up battery and performed self-test on equipment.   Annual Equipment Maintenance on UBC/PM was performed on 11/30/2020.   Exit Site Care: Drive line is being maintained every other day by patient's wife [name]. Existing VAD dressing removed and site care performed using sterile technique. Drive line exit site cleaned with Chlora prep applicators x 2, allowed to dry, and gauze dressing with silver strip re-applied. Exit site partially incorporated with small bloody scab, the velour is fully implanted at exit site. Suture removed today. Slight redness,  no tenderness, drainage, foul odor or rash noted. Drive line anchor replaced. Instructed to change dressing on Wednesday, and if no drainage, may change again on Saturday. Pt denies fever or chills. Pt states they have adequate dressing supplies at home.     BP & Labs:  MAP 74 - Doppler is reflecting MAP  Hgb 12.3 - No S/S of bleeding. Specifically denies melena/BRBPR or  nosebleeds.  LDH stable at 194 with established baseline of 190 - 360. Denies tea-colored urine. No power elevations noted on interrogation.    Plan: 1. Stop Spironolactone 2. Stop daily Colchicine. May take as needed for gout flair up 3. Stop daily Lasix. May take 20 mg (1/2 tablet) daily as needed for shortness of breath, or feel like you have fluid on board 4. May take Flexeril 5 - 10 mg twice a day as needed. This is a muscle relaxant for your shoulder.  5. Coumadin dosing per Ander Purpura PharmD- continue 4 mg daily 6. Return to [facility] in 1 week for follow up appt 7. Change drive line dressing on Wednesday. If no drainage may change again on Saturday  Emerson Monte RN [place] Coordinator  Office: [phone]  24/7 Pager: [phone]   PCP: Monico Blitz, MD Cardiology: Dr. Aundra Dubin  Follow up for Heart Failure/LVAD:  59 y.o. with history of systolic HF,multivessel CAD status post CABG in December 2020(with Maze and LAA clipping)at which point he required Impella support due to cardiogenic shock, paroxysmal atrial fibrillation.type 2 diabetes mellitus, COPD, and hypertension. Now s/p Heartmate 3 LVAD.   Patient had echo in 10/21 with EF 20-25%.  He was admitted in 12/21 with cardiogenic shock.  Cath showed patent grafts and low cardiac output.  Suspect mixed ischemic/nonischemic cardiomyopathy, prior heavy ETOH though no ETOH or smoking since 12/20 CABG.  He required stabilization with Impella 5.5 and ultimately had placement of Heartmate 3 LVAD.  Paroxysmal atrial fibrillation noted post-op, but generally stable course and discharged home.    He is doing well at home.  Main complaint is muscle pain in his upper right back.  No dyspnea walking around his house.  Walking daily for exercise.  Able to climb stairs to his apartment without problem.  No chest pain.  No orthopnea/PND.  Frequent PI events noted on device interrogation.   Denies LVAD alarms.  Denies driveline trauma,  erythema or drainage.  Reports taking Coumadin as prescribed and adherence to anticoagulation based dietary restrictions.  Denies bright red blood per rectum or melena, no dark urine or hematuria.    Labs (12/21): K 4.1, creatinine 0.73, INR 2.4, hgb 12.3, LDH 194   Past Medical History:  Diagnosis Date  . Arthritis   . CAD (coronary artery disease)    a. s/p CABG in 11/2019 with LIMA-LAD, SVG-OM1, SVG-PDA and SVG-D1  . CHF (congestive heart failure) ([place])    a. EF < 20% by echo in 11/2019  . COPD (chronic obstructive pulmonary disease) ([place])   . Essential hypertension   . PAF (paroxysmal atrial fibrillation) ([place])   . Type 2 diabetes mellitus ([place])     Current Outpatient Medications  Medication Sig Dispense Refill  . albuterol (PROVENTIL) (2.5 MG/3ML) 0.083% nebulizer solution Take 2.5 mg by nebulization every 6 (six) hours as needed for wheezing or shortness of breath.     Marland Kitchen amiodarone (PACERONE) 200 MG tablet Take 1 tablet (200 mg total) by mouth daily. 30 tablet 5  . aspirin EC 81 MG tablet Take 81 mg by mouth  daily. Swallow whole.    . blood glucose meter kit and supplies KIT Dispense based on patient and insurance preference. Use to check CBG's three times a day. (FOR ICD-9 250.00, 250.01). 1 each 0  . COMBIVENT RESPIMAT 20-100 MCG/ACT AERS respimat Inhale 1 puff into the lungs every 6 (six) hours as needed. 4 g 1  . dapagliflozin propanediol (FARXIGA) 5 MG TABS tablet Take 1 tablet (5 mg total) by mouth daily before breakfast. 30 tablet 3  . digoxin (LANOXIN) 0.125 MG tablet TAKE 1 TABLET(0.125 MG) BY MOUTH DAILY (Patient taking differently: Take 0.125 mg by mouth daily.) 90 tablet 1  . ENTRESTO 24-26 MG TAKE 1 TABLET BY MOUTH TWICE DAILY 60 tablet 3  . gabapentin (NEURONTIN) 600 MG tablet Take 1 tablet (600 mg total) by mouth 3 (three) times daily. 90 tablet 0  . magnesium oxide (MAG-OX) 400 (241.3 Mg) MG tablet Take 1 tablet (400 mg total) by mouth daily. 30 tablet 2  .  metFORMIN (GLUCOPHAGE) 500 MG tablet Take 2 tablets (1,000 mg total) by mouth 2 (two) times daily with a meal. 60 tablet 2  . mometasone-formoterol (DULERA) 200-5 MCG/ACT AERO Inhale 2 puffs into the lungs in the morning and at bedtime. 1 each 2  . Multiple Vitamin (MULTIVITAMIN WITH MINERALS) TABS tablet Take 1 tablet by mouth daily.    Marland Kitchen OLANZapine (ZYPREXA) 5 MG tablet Take 5 mg by mouth at bedtime.    . pantoprazole (PROTONIX) 40 MG tablet Take 1 tablet (40 mg total) by mouth daily. 30 tablet 2  . potassium chloride SA (KLOR-CON) 20 MEQ tablet Take 2 tablets (40 mEq total) by mouth daily. 60 tablet 3  . rosuvastatin (CRESTOR) 40 MG tablet Take 1 tablet (40 mg total) by mouth daily. 30 tablet 3  . Tetrahydrozoline HCl (REDNESS RELIEVER EYE DROPS OP) Apply 2 drops to eye daily as needed.    . traZODone (DESYREL) 150 MG tablet Take 150 mg by mouth at bedtime.    Marland Kitchen warfarin (COUMADIN) 4 MG tablet Take 1 tablet (4 mg total) by mouth daily at 4 PM. 30 tablet 5  . colchicine 0.6 MG tablet Take 1 tablet (0.6 mg total) by mouth as needed. 60 tablet 3  . cyclobenzaprine (FLEXERIL) 5 MG tablet Take 1-2 tablets (5-10 mg total) by mouth 2 (two) times daily as needed for muscle spasms. 30 tablet 0  . furosemide (LASIX) 40 MG tablet Take 0.5 tablets (20 mg total) by mouth daily as needed. 30 tablet 5   No current facility-administered medications for this encounter.    Patient has no known allergies.  REVIEW OF SYSTEMS: All systems negative except as listed in HPI, PMH and Problem list.   LVAD INTERROGATION:  See nurse's note above.    I reviewed the LVAD parameters from today, and compared the results to the patient's prior recorded data.  No programming changes were made.  The LVAD is functioning within specified parameters.  The patient performs LVAD self-test daily.  LVAD interrogation was negative for any significant power changes, alarms or PI events/speed drops.  LVAD equipment check completed  and is in good working order.  Back-up equipment present.   LVAD education done on emergency procedures and precautions and reviewed exit site care.    Vitals:   12/04/20 0933 12/04/20 0936  BP: (!) 74/0 (!) 84/61  Pulse: (!) 104   Temp:  97.7 F (36.5 C)  TempSrc:  Oral  SpO2: 100%   Weight: 78.1 kg (  172 lb 3.2 oz)   MAP 74  Physical Exam: GENERAL: Well appearing, male who presents to clinic today in no acute distress. HEENT: normal  NECK: Supple, JVP not elevated.  2+ bilaterally, no bruits.  No lymphadenopathy or thyromegaly appreciated.  CARDIAC:  Mechanical heart sounds with LVAD hum present.  LUNGS:  Clear to auscultation bilaterally.  ABDOMEN:  Soft, round, nontender, positive bowel sounds x4.     LVAD exit site: well-healed and incorporated.  Dressing dry and intact.  No erythema or drainage.  Stabilization device present and accurately applied.  Driveline dressing is being changed daily per sterile technique. EXTREMITIES:  Warm and dry, no cyanosis, clubbing, rash or edema  NEUROLOGIC:  Alert and oriented x 4.  Gait steady.  No aphasia.  No dysarthria.  Affect pleasant.     ASSESSMENT AND PLAN: 1. Chronic systolic CHF: Echo 79/03 with EF 20-25%, mildly decreased RV function. LHC/RHC in 12/21 with patent grafts, low output. Suspect mixed ischemic/nonischemic cardiomyopathy (prior heavy ETOH and drugs as well as CAD). No ETOH, drugs, smoking since CABG in 12/20. Admitted with cardiogenic shock in 12/21, had placement of Impella 5.5 initially, now s/p Heartmate 3 LVAD on 11/17/20.  Doing well symptomatically, NYHA class II.  Multiple PI events, suspect volume depleted (recent GI illness).  No BRBPR/melena.  - Stop Lasix and spironolactone.  Can use Lasix 20 mg po daily prn weight gain.   - Continue Entresto 24-26 BID - Continue digoxin 0.125. Check level today. - Continue dapagliflozine.  - Continue ASA 81 mg daily for 1 month post-op then stop.  - Continue Coumadin, INR  therapeutic today.  - Cardiac rehab when cleared by surgery.  2. Acute hypoxemic respiratory failure: Now extubated and stable.  3. CAD: S/p CABG 12/20. LHC this admission with patent grafts, no target for intervention. - Continue statin. 4. Atrial fibrillation: Paroxysmal. S/p Maze and LA appendage clip with CABG in 12/20.  In NSR.  - Continue amiodarone 200 mg daily, will need to follow LFTs and TSH. He will need regular eye exams.   - Will be on lifelong anticoagulation w/ VAD. Continue Coumadin  5. Type 2 diabetes: Close followup with PCP.   6. Prior ETOH, drugs: None x  >1 year. 7. Gout: He has been taking bid colchicine, no gout pain.  He can stop colchicine.  8. Back pain: Seems muscular (point tenderness).  - I will let him try Flexeril to see if this helps.   Loralie Champagne 12/04/2020

## 2020-12-04 NOTE — Patient Instructions (Addendum)
1. Stop Spironolactone 2. Stop daily Colchicine. May take as needed for gout flair up 3. Stop daily Lasix. May take 20 mg (1/2 tablet) daily as needed for shortness of breath, or feel like you have fluid on board 4. May take Flexeril 5 - 10 mg twice a day as needed. This is a muscle relaxant for your shoulder.  5. Coumadin dosing per Leotis Shames PharmD- continue 4 mg daily 6. Return to [facility] clinic in 1 week for follow up appt 7. Change drive line dressing on Wednesday. If no drainage may change again on Saturday

## 2020-12-04 NOTE — Addendum Note (Signed)
Encounter addended by: Laurey Morale, MD on: 12/04/2020 10:22 PM  Actions taken: Charge Capture section accepted, Clinical Note Signed, Level of Service modified, Visit diagnoses modified

## 2020-12-04 NOTE — Addendum Note (Signed)
Encounter addended by: Bernita Raisin, RN on: 12/04/2020 3:01 PM  Actions taken: Clinical Note Signed

## 2020-12-05 IMAGING — CT CT CHEST W/ CM
2 of 6 series · 13 of 36 positions shown, 16 images · IV contrast (APPLIED)
Comparison: Chest CTA 11/20/2019 and chest CT 02/15/2020.

CLINICAL DATA: Postop CABG 1 year ago with recurrent heart failure.
Pre LVAD evaluation.

EXAM:
CT CHEST, ABDOMEN, AND PELVIS WITH CONTRAST
TECHNIQUE: Multidetector CT imaging of the chest, abdomen and pelvis was
performed following the standard protocol during bolus
administration of intravenous contrast.
CONTRAST:  100mL OMNIPAQUE IOHEXOL 300 MG/ML  SOLN

[Series 4: thins · axial · 0.83mm/px · z∈[-653,-80]mm · 10 of 803 slices shown, 13 images]
[im 43/803  mediastinal]
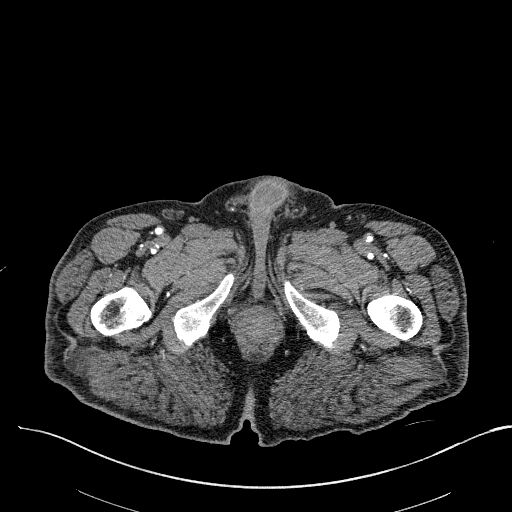
[im 43/803  lung]
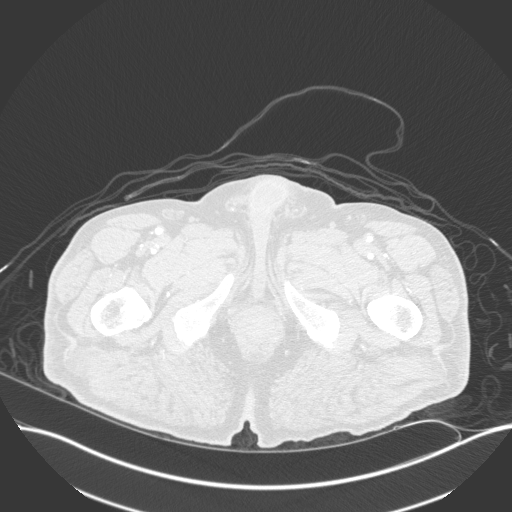
[im 127/803  lung]
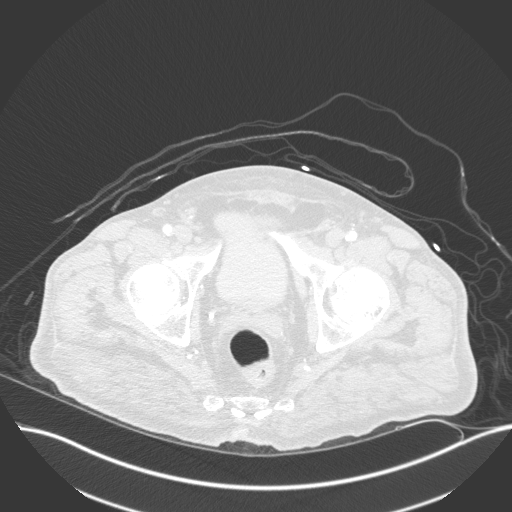
[im 212/803  lung]
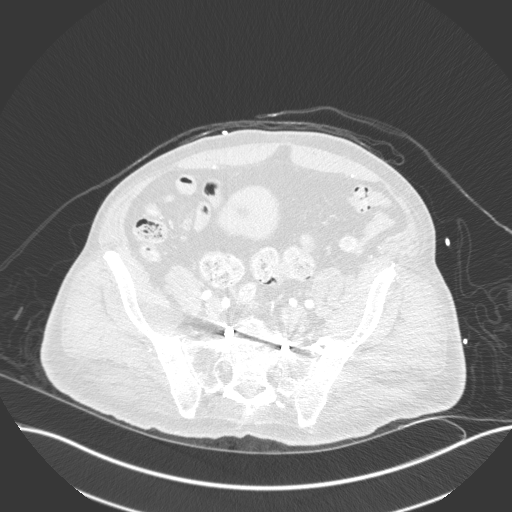
[im 296/803  lung]
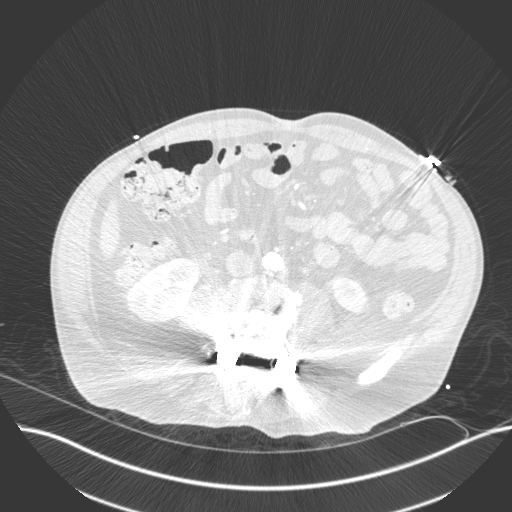
[im 380/803  mediastinal]
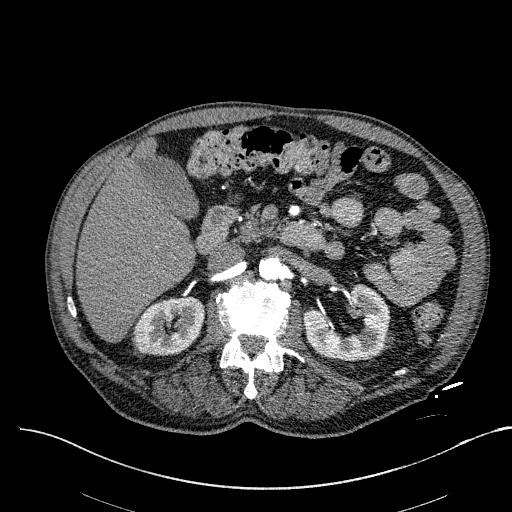
[im 380/803  lung]
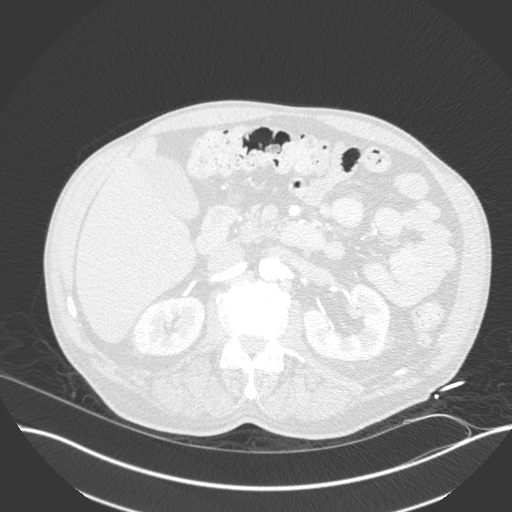
[im 423/803  lung]
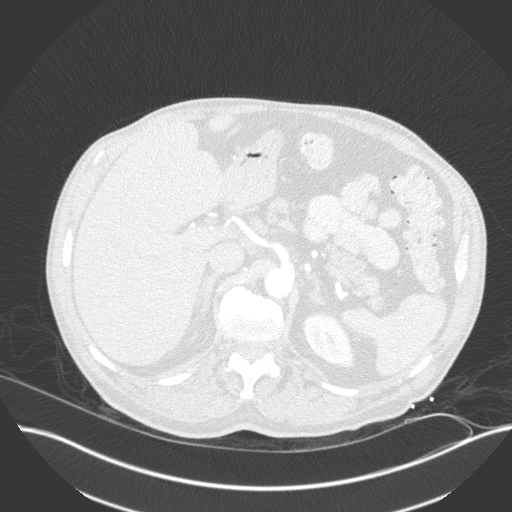
[im 507/803  lung]
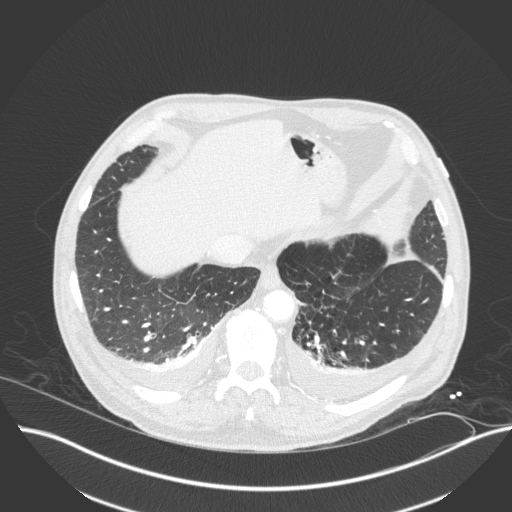
[im 591/803  lung]
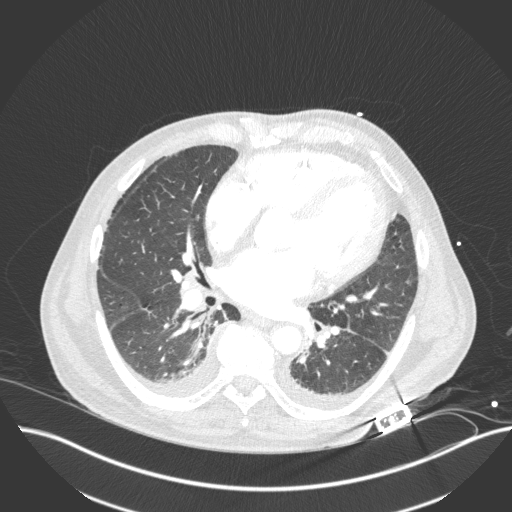
[im 676/803  mediastinal]
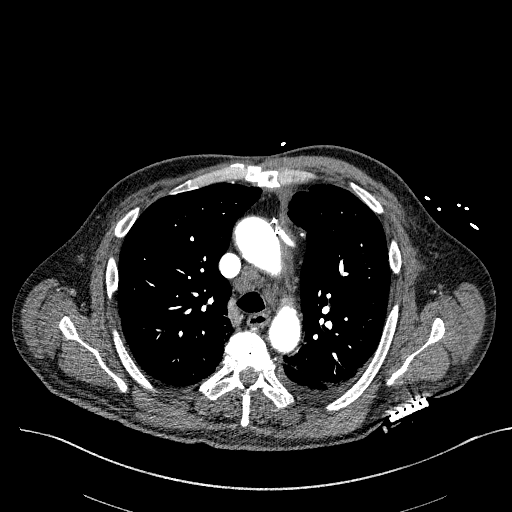
[im 676/803  lung]
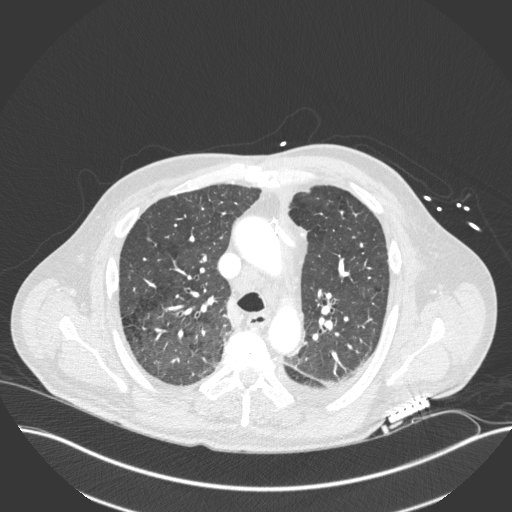
[im 760/803  lung]
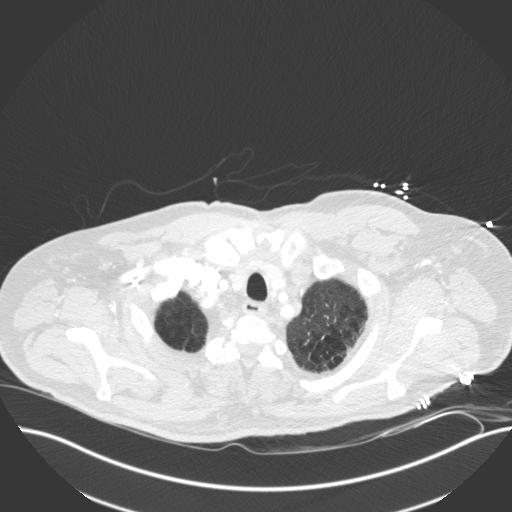

[Series 6: coronal · coronal · 0.80mm/px · 3 of 159 slices shown]
[im 32/159  lung]
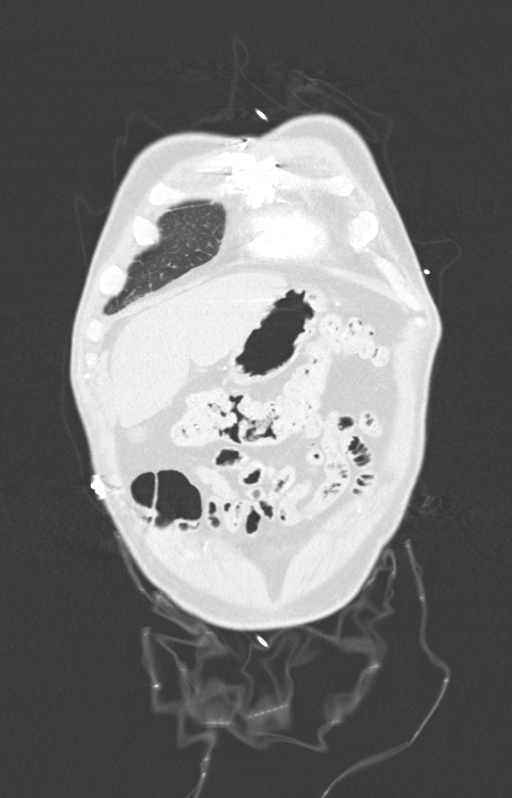
[im 64/159  lung]
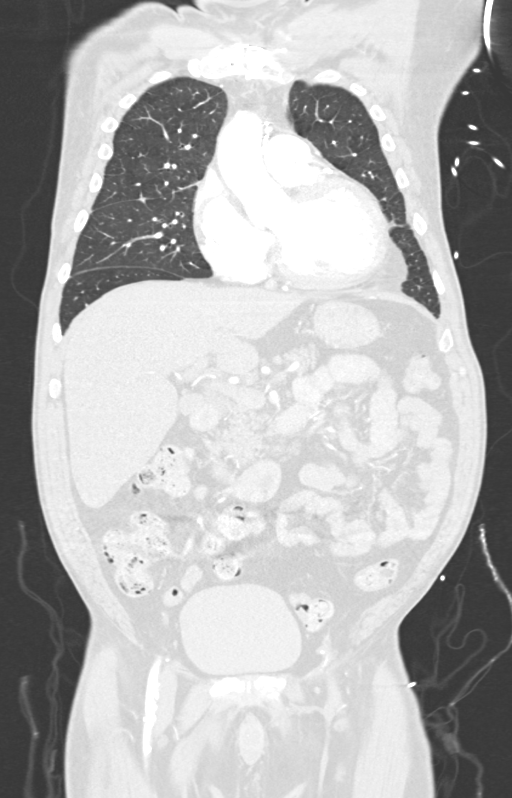
[im 95/159  lung]
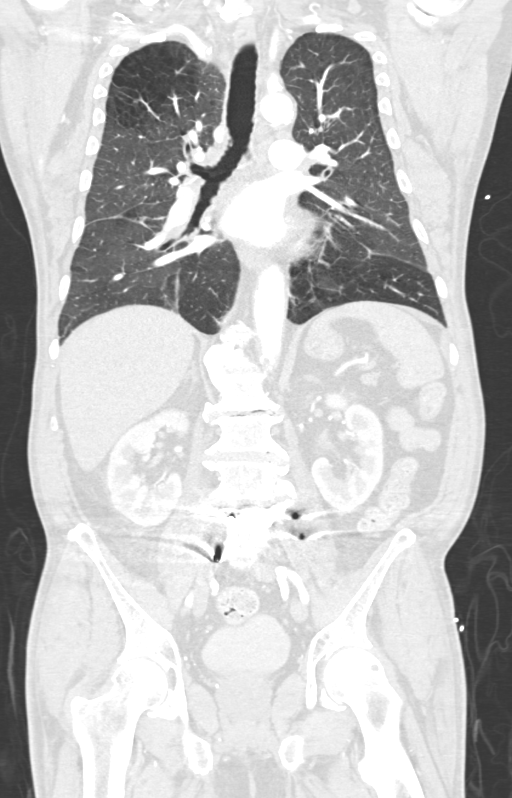

[13 of 36 positions shown; findings below may reference images not displayed]

FINDINGS: CT CHEST FINDINGS

Cardiovascular: Flow directed right internal jugular central venous
catheter is unchanged, tip in the proximal right pulmonary artery.
There is atherosclerosis of the aorta, great vessels and coronary
arteries status post median sternotomy and CABG. No evidence of
large vessel occlusion. The pulmonary arteries are well opacified
with contrast. There is no evidence of acute pulmonary embolism. The
heart size is normal. There is no pericardial effusion.

Mediastinum/Nodes: Small mediastinal and right hilar lymph nodes
appear similar to the previous study. No evidence of mediastinal
hematoma. The thyroid gland, trachea and esophagus demonstrate no
significant findings.

Lungs/Pleura: New small dependent bilateral pleural effusions with
associated bibasilar atelectasis. Underlying mild to moderate
centrilobular emphysema. No focal airspace disease or suspicious
nodularity.

Musculoskeletal/Chest wall: Previous median sternotomy with
incomplete osseous fusion of the sternum. The sternotomy wires are
intact, and there is no dehiscence. No acute osseous findings.

CT ABDOMEN AND PELVIS FINDINGS

Hepatobiliary: The liver is normal in density without suspicious
focal abnormality. No evidence of gallstones, gallbladder wall
thickening or biliary dilatation.

Pancreas: Unremarkable. No pancreatic ductal dilatation or
surrounding inflammatory changes.

Spleen: Normal in size without focal abnormality.

Adrenals/Urinary Tract: Both adrenal glands appear normal. Tiny
probable left renal cysts. No evidence of renal mass, urinary tract
calculus or hydronephrosis. There is contrast material within the
urinary bladder.

Stomach/Bowel: No evidence of bowel wall thickening, distention or
surrounding inflammatory change. The appendix appears normal.
Moderate stool throughout the colon.

Vascular/Lymphatic: There are no enlarged abdominal or pelvic lymph
nodes. No evidence of retroperitoneal or pelvic hematoma. Extensive
aortic and branch vessel atherosclerosis with greater than 50%
diameter stenosis of the proximal superior mesenteric artery
(sagittal image 100/7). There is also high-grade proximal stenosis
of the right renal artery. No evidence of large vessel occlusion.
Limited venous opacification.

Reproductive: The prostate gland is unremarkable.

Other: No ascites, free air or focal extraluminal fluid collection.

Musculoskeletal: No acute or significant osseous findings.
Multilevel lumbar spondylosis post L4 through S1 PLIF.
IMPRESSION: 1. No acute vascular findings post median sternotomy and CABG. No
evidence of acute pulmonary embolism.
2. Diffuse atherosclerosis as described with high-grade proximal
stenosis of the superior mesenteric and right renal arteries.
3. Small bilateral pleural effusions and bibasilar atelectasis. No
pneumothorax.
4. Incomplete fusion of the sternum post sternotomy. No dehiscence
or sternal wire fracture.
5. No acute findings in the abdomen or pelvis.
6. Aortic Atherosclerosis (LTWQY-YKT.T) and Emphysema ([id number]-[id number].Q).

## 2020-12-07 ENCOUNTER — Other Ambulatory Visit (HOSPITAL_COMMUNITY): Payer: Self-pay | Admitting: *Deleted

## 2020-12-07 DIAGNOSIS — Z95811 Presence of heart assist device: Secondary | ICD-10-CM

## 2020-12-07 DIAGNOSIS — Z7901 Long term (current) use of anticoagulants: Secondary | ICD-10-CM

## 2020-12-07 DIAGNOSIS — I5022 Chronic systolic (congestive) heart failure: Secondary | ICD-10-CM

## 2020-12-11 ENCOUNTER — Encounter (HOSPITAL_COMMUNITY): Payer: 59

## 2020-12-11 ENCOUNTER — Telehealth (HOSPITAL_COMMUNITY): Payer: Self-pay

## 2020-12-11 NOTE — Telephone Encounter (Signed)
I spoke with patients mother about scheduling patients F/U Suture Removal appt following his Dental Surgery. She stated he was not in and she would have him return my call to schedule.

## 2020-12-12 IMAGING — DX DG CHEST 1V PORT
1 series · 1 of 1 positions shown · non-contrast
Comparison: 11/16/2020 and prior

CLINICAL DATA: History of open heart surgery.

EXAM:
PORTABLE CHEST 1 VIEW

[chest]
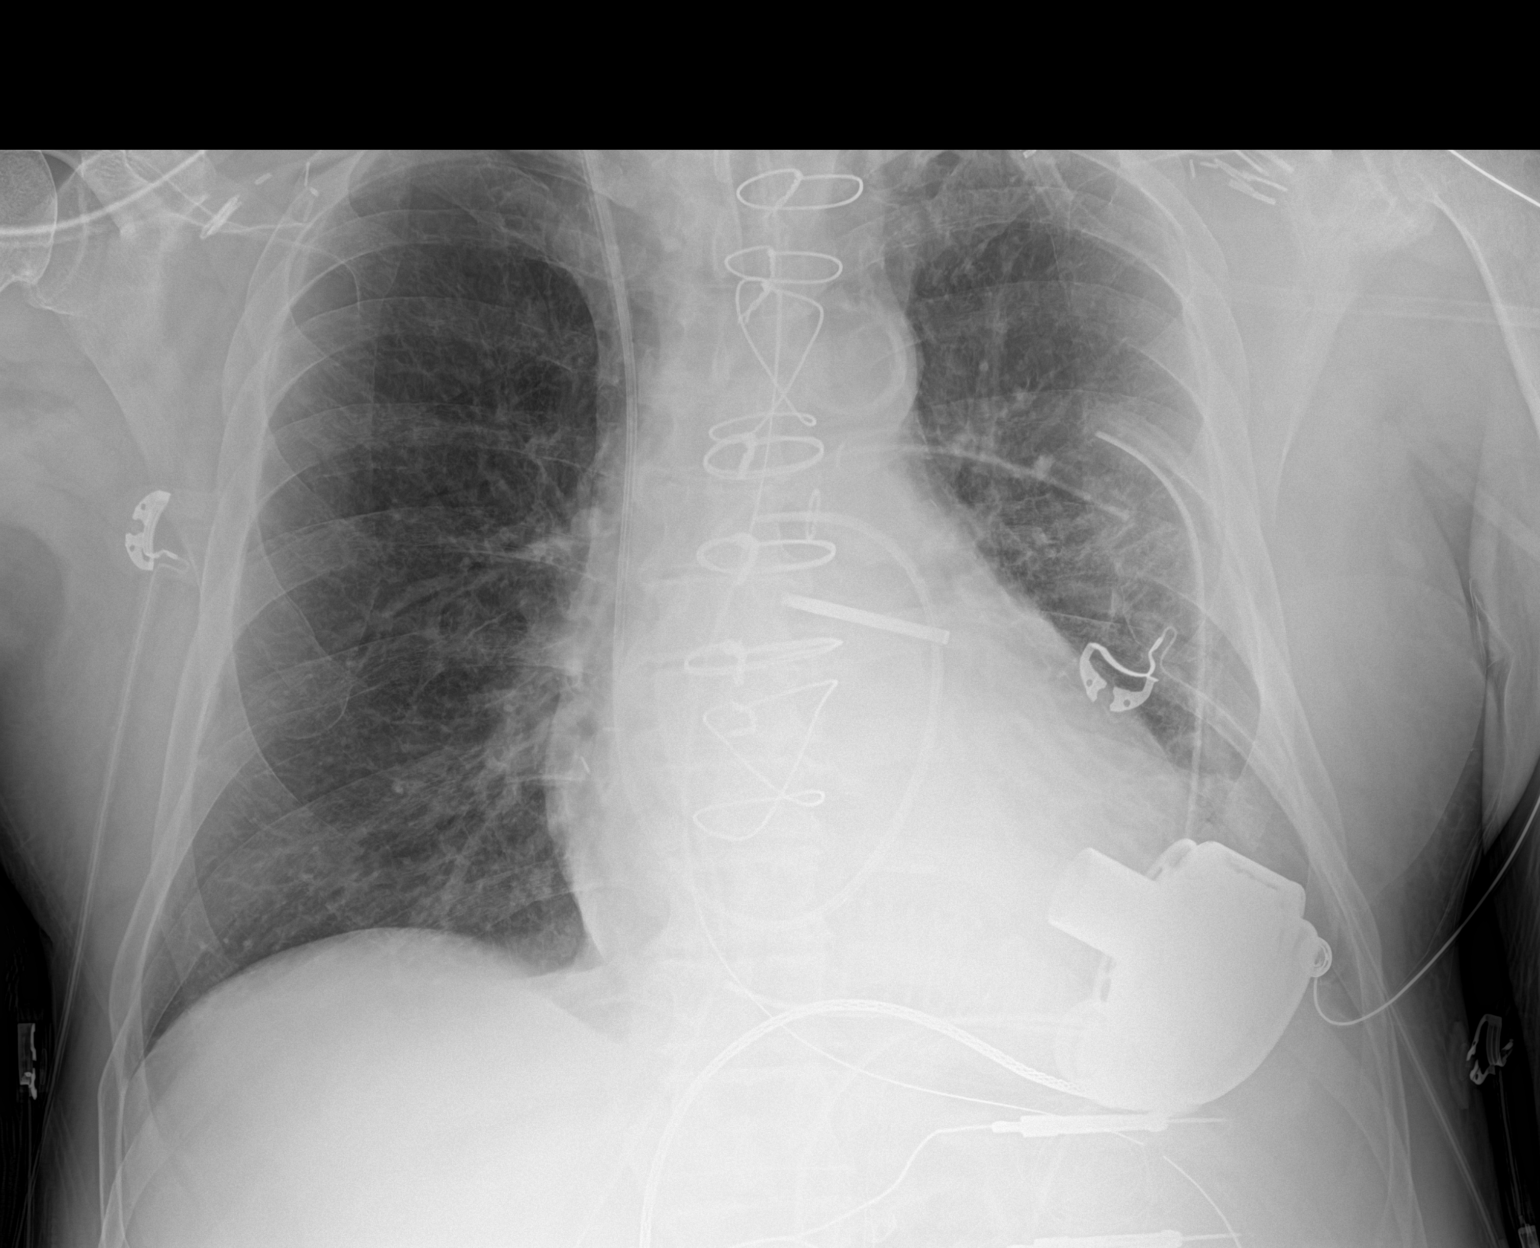

[1 of 1 positions shown; findings below may reference images not displayed]

FINDINGS: Right IJ approach Swan-Ganz catheter tip overlies the proximal right
main PA. Postsurgical appearance of the cardiomediastinal silhouette
with interval placement of LVAD. Stable cardiomegaly. Mediastinal
and left chest tubes. No pneumothorax or pleural effusion.

Diffuse interstitial prominence. ETT is approximately 2.3 cm above
the carina. Non weighted enteric tube side hole overlies the gastric
body. Tip outside field of view.
IMPRESSION: Postsurgical appearance of the chest with interval LVAD placement.

Support lines and tubes as detailed above.

Stable cardiomegaly.  No focal consolidation or pneumothorax.

## 2020-12-15 ENCOUNTER — Encounter (HOSPITAL_COMMUNITY): Payer: Self-pay

## 2020-12-15 ENCOUNTER — Ambulatory Visit (HOSPITAL_COMMUNITY)
Admission: RE | Admit: 2020-12-15 | Discharge: 2020-12-15 | Disposition: A | Discharge status: Home / Self Care | Payer: 59 | Source: Ambulatory Visit | Attending: Internal Medicine | Admitting: Internal Medicine

## 2020-12-15 ENCOUNTER — Ambulatory Visit (HOSPITAL_COMMUNITY): Payer: Self-pay | Admitting: Pharmacist

## 2020-12-15 ENCOUNTER — Other Ambulatory Visit: Payer: Self-pay

## 2020-12-15 VITALS — BP 143/99 | HR 74 | Temp 97.8°F | Ht 70.0 in | Wt 179.4 lb

## 2020-12-15 DIAGNOSIS — Z7982 Long term (current) use of aspirin: Secondary | ICD-10-CM | POA: Insufficient documentation

## 2020-12-15 DIAGNOSIS — Z7951 Long term (current) use of inhaled steroids: Secondary | ICD-10-CM | POA: Insufficient documentation

## 2020-12-15 DIAGNOSIS — E119 Type 2 diabetes mellitus without complications: Secondary | ICD-10-CM | POA: Diagnosis not present

## 2020-12-15 DIAGNOSIS — M109 Gout, unspecified: Secondary | ICD-10-CM | POA: Diagnosis not present

## 2020-12-15 DIAGNOSIS — Z951 Presence of aortocoronary bypass graft: Secondary | ICD-10-CM | POA: Diagnosis not present

## 2020-12-15 DIAGNOSIS — I5022 Chronic systolic (congestive) heart failure: Secondary | ICD-10-CM | POA: Diagnosis not present

## 2020-12-15 DIAGNOSIS — Z95811 Presence of heart assist device: Secondary | ICD-10-CM | POA: Diagnosis not present

## 2020-12-15 DIAGNOSIS — Z7984 Long term (current) use of oral hypoglycemic drugs: Secondary | ICD-10-CM | POA: Diagnosis not present

## 2020-12-15 DIAGNOSIS — Z7901 Long term (current) use of anticoagulants: Secondary | ICD-10-CM | POA: Insufficient documentation

## 2020-12-15 DIAGNOSIS — J449 Chronic obstructive pulmonary disease, unspecified: Secondary | ICD-10-CM | POA: Insufficient documentation

## 2020-12-15 DIAGNOSIS — I480 Paroxysmal atrial fibrillation: Secondary | ICD-10-CM | POA: Diagnosis not present

## 2020-12-15 DIAGNOSIS — I2510 Atherosclerotic heart disease of native coronary artery without angina pectoris: Secondary | ICD-10-CM | POA: Diagnosis not present

## 2020-12-15 DIAGNOSIS — Z79899 Other long term (current) drug therapy: Secondary | ICD-10-CM | POA: Insufficient documentation

## 2020-12-15 DIAGNOSIS — I110 Hypertensive heart disease with heart failure: Secondary | ICD-10-CM | POA: Insufficient documentation

## 2020-12-15 LAB — BASIC METABOLIC PANEL
Anion gap: 15 (ref 5–15)
BUN: 10 mg/dL (ref 6–20)
CO2: 24 mmol/L (ref 22–32)
Calcium: 9.1 mg/dL (ref 8.9–10.3)
Chloride: 99 mmol/L (ref 98–111)
Creatinine, Ser: 0.67 mg/dL (ref 0.61–1.24)
GFR, Estimated: >60 mL/min (ref >60)
Glucose, Bld: 113 mg/dL — ABNORMAL HIGH (ref 70–99)
Potassium: 3.7 mmol/L (ref 3.5–5.1)
Sodium: 138 mmol/L (ref 135–145)

## 2020-12-15 LAB — CBC
HCT: 35.1 % — ABNORMAL LOW (ref 39.0–52.0)
Hemoglobin: 11.9 g/dL — ABNORMAL LOW (ref 13.0–17.0)
MCH: 28.6 pg (ref 26.0–34.0)
MCHC: 33.9 g/dL (ref 30.0–36.0)
MCV: 84.4 fL (ref 80.0–100.0)
Platelets: 300 10*3/uL (ref 150–400)
RBC: 4.16 MIL/uL — ABNORMAL LOW (ref 4.22–5.81)
RDW: 15.2 % (ref 11.5–15.5)
WBC: 8.4 10*3/uL (ref 4.0–10.5)
nRBC: 0 % (ref 0.0–0.2)

## 2020-12-15 LAB — PROTIME-INR
INR: 2.1 — ABNORMAL HIGH (ref 0.8–1.2)
Prothrombin Time: 22.5 seconds — ABNORMAL HIGH (ref 11.4–15.2)

## 2020-12-15 LAB — LACTATE DEHYDROGENASE: LDH: 182 U/L (ref 98–192)

## 2020-12-15 MED ORDER — SACUBITRIL-VALSARTAN 49-51 MG PO TABS: 1.0000 | ORAL_TABLET | Freq: Two times a day (BID) | ORAL | 11 refills | Status: DC

## 2020-12-15 MED ORDER — AMIODARONE HCL 200 MG PO TABS: 100.0000 mg | ORAL_TABLET | Freq: Every day | ORAL | 5 refills | Status: DC

## 2020-12-15 NOTE — Progress Notes (Addendum)
Patient presents for hospital d/c follow up in [facility] today with his wife [name]. Reports no problems with VAD equipment or concerns with drive line.  Multiple "no external power" alarms noted. Pt says these occurred when he disconnected both power cables while untangling patient cable. No pump stops recorded.   Pt returned loaner UBC and was provided with his new UBC.   Patient reports he has been feeling "good" and has increased his activity since last visit. He is walking daily and has started vacuuming. Reminded him of sternal precautions in re: to vacuuming.   Wife confirms pt stopped daily spiro, lasix, and colchicine. He will take lasix and colchicine only as needed. He has not taken either since last visit.   BP elevated today, he did take his am meds this am. Per Dr. Aundra Dubin, pt will increase Entresto to 49/51 twice daily. He will also decrease Amiodarone to 100 mg daily. He is to stop aspirin after Monday, 12/19/19 dose.   He does report occasional "rib rub" pain left rib area under pump, resolves when he lies on right side.   He reports change in appetite, some things "don't taste the same" and he is anxious to get dentures since dental extractions prior to VAD implant.   He reports much improvement on his home blood sugars; stating they have been running between 100 - 170.  Vital Signs:  Temp: 97.8 Doppler Pressure: 110 Automatc BP: 143/99 (113) HR: 74 SPO2: 100%  Weight: 179.4 lb w/ eqt Last weight: 172.2 lb  Home weights: 170 - 172 lbs   VAD Indication: Destination Therapy. HM III implanted 11/17/20 by Dr Orvan Seen.  VAD interrogation: Speed: 5500 Flow: 3.9 Power: 4.1w    PI: 5.1 Hct: 33  Alarms: multiple "no external power" alarms  Events: 10 - 20 daily  Fixed speed: 5500 Low speed limit: 5200  Primary controller back up battery due for replacement in 27 months. Back up controller back up battery due for change in 27 months.  I reviewed the LVAD  parameters from today and compared the results to the patient's prior recorded data. LVAD interrogation was NEGATIVE for significant power changes, NEGATIVE for clinical alarms and STABLE for PI events/speed drops. No programming changes were made and pump is functioning within specified parameters. Pt is performing daily controller and system monitor self tests along with completing weekly and monthly maintenance for LVAD equipment.  LVAD equipment check completed and is in good working order. Back-up equipment present. Charged back up battery and performed self-test on equipment.   Annual Equipment Maintenance on UBC/PM was performed on 11/30/2020.   Exit Site Care: Drive line is being maintained every other day by patient's wife [name]. Existing VAD dressing removed and site care performed using sterile technique. Drive line exit site cleaned with Chlora prep applicators x 2, allowed to dry, and gauze dressing with silver strip re-applied. Exit site partially incorporated with small bloody scab, the velour is fully implanted at exit site. Suture removed today. Slight redness, no tenderness, drainage, foul odor or rash noted. Drive line anchor replaced. Instructed to change dressing on Wednesday, and if no drainage, may change again on Saturday. Pt denies fever or chills. Pt states they have adequate dressing supplies at home.   BP & Labs:  Doppler 110 - Doppler is reflecting MAP  Hgb 11.9- No S/S of bleeding. Specifically denies melena/BRBPR or nosebleeds.  LDH pending -  with established baseline of 190 - 360. Denies tea-colored urine. No power  elevations noted on interrogation.   Plan: 1. Decrease Amiodarone to 100 mg (1/2 tablet) daily.  2. Increase Entresto to 49/51 mg twice daily. May take two of your present pills twice daily. New Rx sent for increased dose. 3. Stop Aspirin after Monday (12/19/19) dose.  4. Advance dressing changes to weekly. Call if any changes.  5. Lauren will  call with warfarin dosing. 6. Return in 3 weeks.   Zada Girt RN VAD Coordinator  Office: [phone]  24/7 Pager: [phone]   PCP: Monico Blitz, MD Cardiology: Dr. Aundra Dubin  Follow up for Heart Failure/LVAD:  60 y.o. with history of systolic HF,multivessel CAD status post CABG in December 2020(with Maze and LAA clipping)at which point he required Impella support due to cardiogenic shock, paroxysmal atrial fibrillation.type 2 diabetes mellitus, COPD, and hypertension. Now s/p Heartmate 3 LVAD.   Patient had echo in 10/21 with EF 20-25%.  He was admitted in 12/21 with cardiogenic shock.  Cath showed patent grafts and low cardiac output.  Suspect mixed ischemic/nonischemic cardiomyopathy, prior heavy ETOH though no ETOH or smoking since 12/20 CABG.  He required stabilization with Impella 5.5 and ultimately had placement of Heartmate 3 LVAD.  Paroxysmal atrial fibrillation noted post-op, but generally stable course and discharged home.    He has been doing well.  Now off Lasix.  PIs significantly less, now 5-10/day.  Short of breath walking up stairs but improved from the past.  No dyspnea walking on flat ground.  He can vacuum now and do chores around the house with no problems. No lightheadedness.  He is staying off cigarettes.  MAP is high today.   Denies LVAD alarms.  Denies driveline trauma, erythema or drainage.  Reports taking Coumadin as prescribed and adherence to anticoagulation based dietary restrictions.  Denies bright red blood per rectum or melena, no dark urine or hematuria.    Labs (12/21): K 4.1, creatinine 0.73, INR 2.4, hgb 12.3, LDH 194, LFTs normal, TSH normal   Past Medical History:  Diagnosis Date  . Arthritis   . CAD (coronary artery disease)    a. s/p CABG in 11/2019 with LIMA-LAD, SVG-OM1, SVG-PDA and SVG-D1  . CHF (congestive heart failure) ([place])    a. EF < 20% by echo in 11/2019  . COPD (chronic obstructive pulmonary disease) ([place])   . Essential  hypertension   . PAF (paroxysmal atrial fibrillation) ([place])   . Type 2 diabetes mellitus ([place])     Current Outpatient Medications  Medication Sig Dispense Refill  . albuterol (PROVENTIL) (2.5 MG/3ML) 0.083% nebulizer solution Take 2.5 mg by nebulization every 6 (six) hours as needed for wheezing or shortness of breath.     Marland Kitchen aspirin EC 81 MG tablet Take 81 mg by mouth daily. Swallow whole.    . blood glucose meter kit and supplies KIT Dispense based on patient and insurance preference. Use to check CBG's three times a day. (FOR ICD-9 250.00, 250.01). 1 each 0  . COMBIVENT RESPIMAT 20-100 MCG/ACT AERS respimat Inhale 1 puff into the lungs every 6 (six) hours as needed. 4 g 1  . cyclobenzaprine (FLEXERIL) 5 MG tablet Take 1-2 tablets (5-10 mg total) by mouth 2 (two) times daily as needed for muscle spasms. 30 tablet 0  . dapagliflozin propanediol (FARXIGA) 5 MG TABS tablet Take 1 tablet (5 mg total) by mouth daily before breakfast. 30 tablet 3  . digoxin (LANOXIN) 0.125 MG tablet TAKE 1 TABLET(0.125 MG) BY MOUTH DAILY (Patient taking differently: Take 0.125 mg  by mouth daily.) 90 tablet 1  . gabapentin (NEURONTIN) 600 MG tablet Take 1 tablet (600 mg total) by mouth 3 (three) times daily. 90 tablet 0  . magnesium oxide (MAG-OX) 400 (241.3 Mg) MG tablet Take 1 tablet (400 mg total) by mouth daily. 30 tablet 2  . metFORMIN (GLUCOPHAGE) 1000 MG tablet Take 1,000 mg by mouth in the morning and at bedtime.    . mometasone-formoterol (DULERA) 200-5 MCG/ACT AERO Inhale 2 puffs into the lungs in the morning and at bedtime. 1 each 2  . Multiple Vitamin (MULTIVITAMIN WITH MINERALS) TABS tablet Take 1 tablet by mouth daily.    Marland Kitchen OLANZapine (ZYPREXA) 5 MG tablet Take 5 mg by mouth at bedtime.    . pantoprazole (PROTONIX) 40 MG tablet Take 1 tablet (40 mg total) by mouth daily. 30 tablet 2  . potassium chloride SA (KLOR-CON) 20 MEQ tablet Take 2 tablets (40 mEq total) by mouth daily. 60 tablet 3  .  rosuvastatin (CRESTOR) 40 MG tablet Take 1 tablet (40 mg total) by mouth daily. 30 tablet 3  . sacubitril-valsartan (ENTRESTO) 49-51 MG Take 1 tablet by mouth 2 (two) times daily. 60 tablet 11  . Tetrahydrozoline HCl (REDNESS RELIEVER EYE DROPS OP) Apply 2 drops to eye daily as needed.    . traZODone (DESYREL) 150 MG tablet Take 150 mg by mouth at bedtime.    Marland Kitchen warfarin (COUMADIN) 4 MG tablet Take 1 tablet (4 mg total) by mouth daily at 4 PM. 30 tablet 5  . amiodarone (PACERONE) 200 MG tablet Take 0.5 tablets (100 mg total) by mouth daily. 30 tablet 5  . colchicine 0.6 MG tablet Take 1 tablet (0.6 mg total) by mouth as needed. (Patient not taking: Reported on 12/15/2020) 60 tablet 3  . furosemide (LASIX) 40 MG tablet Take 0.5 tablets (20 mg total) by mouth daily as needed. (Patient not taking: Reported on 12/15/2020) 30 tablet 5   No current facility-administered medications for this encounter.    Patient has no known allergies.  REVIEW OF SYSTEMS: All systems negative except as listed in HPI, PMH and Problem list.   LVAD INTERROGATION:  See nurse's note above.    I reviewed the LVAD parameters from today, and compared the results to the patient's prior recorded data.  No programming changes were made.  The LVAD is functioning within specified parameters.  The patient performs LVAD self-test daily.  LVAD interrogation was negative for any significant power changes, alarms or PI events/speed drops.  LVAD equipment check completed and is in good working order.  Back-up equipment present.   LVAD education done on emergency procedures and precautions and reviewed exit site care.    Vitals:   12/15/20 1312 12/15/20 1313  BP: (!) 110/0 (!) 143/99  Pulse:  74  Temp:  97.8 F (36.6 C)  SpO2:  100%  Weight:  81.4 kg (179 lb 6.4 oz)  Height:  $Remove'5\' 10"'rbBfKuA$  (1.778 m)  MAP 74  Physical Exam: General: Well appearing this am. NAD.  HEENT: Normal. Neck: Supple, JVP 7-8 cm. Carotids OK.  Cardiac:   Mechanical heart sounds with LVAD hum present.  Lungs:  CTAB, normal effort.  Abdomen:  NT, ND, no HSM. No bruits or masses. +BS  LVAD exit site: Well-healed and incorporated. Dressing dry and intact. No erythema or drainage. Stabilization device present and accurately applied. Driveline dressing changed daily per sterile technique. Extremities:  Warm and dry. No cyanosis, clubbing, rash, or edema.  Neuro:  Alert &  oriented x 3. Cranial nerves grossly intact. Moves all 4 extremities w/o difficulty. Affect pleasant    ASSESSMENT AND PLAN: 1. Chronic systolic CHF: Echo 88/33 with EF 20-25%, mildly decreased RV function. LHC/RHC in 12/21 with patent grafts, low output. Suspect mixed ischemic/nonischemic cardiomyopathy (prior heavy ETOH and drugs as well as CAD). No ETOH, drugs, smoking since CABG in 12/20. Admitted with cardiogenic shock in 12/21, had placement of Impella 5.5 initially, now s/p Heartmate 3 LVAD on 11/17/20.  Doing well symptomatically, NYHA class II.  Fewer PI events now that he is off Lasix.  No BRBPR/melena. MAP elevated.  - Continue to use Lasix 20 mg po daily prn weight gain.   - Increase Entresto to 49/51 bid.  - Continue digoxin 0.125. Check level today. - Continue dapagliflozin.  - He will stop ASA at 1 month (1/10).  - Continue Coumadin.  2. CAD: S/p CABG 12/20. LHC this admission with patent grafts, no target for intervention. - Continue statin. 3. Atrial fibrillation: Paroxysmal. S/p Maze and LA appendage clip with CABG in 12/20.  In NSR.  - Can decrease amiodarone to 100 mg daily, will need to follow LFTs and TSH. He will need regular eye exams.   - Will be on lifelong anticoagulation w/ VAD. Continue Coumadin  4. Type 2 diabetes: Close followup with PCP.   5. Prior ETOH, drugs: None x  >1 year. 6. Gout: Can use colchicine prn.   Loralie Champagne 12/17/2020

## 2020-12-15 NOTE — Progress Notes (Signed)
LVAD INR 

## 2020-12-15 NOTE — Progress Notes (Signed)
CSW met with patient and wife in the clinic. Patient states he is doing well since discharge home and even increasing his daily activity. Patient and wife deny any concerns at this time and will reach out to CSW if needed. CSW continues to follow. Raquel Sarna, [place], [place]

## 2020-12-15 NOTE — Patient Instructions (Addendum)
1. Decrease Amiodarone to 100 mg (1/2 tablet) daily.  2. Increase Entresto to 49/51 mg twice daily. May take two of your present pills twice daily. New Rx sent for increased dose. 3. Stop Aspirin after Monday (12/19/19) dose.  4. Advance dressing changes to weekly. Call if any changes.  5. Lauren will call with warfarin dosing. 6. Return in 3 weeks.

## 2020-12-16 IMAGING — DX DG CHEST 1V
1 series · 1 of 1 positions shown · non-contrast
Comparison: 11/20/2020.

CLINICAL DATA: Chest discomfort.  Left ventricular assist device.

EXAM:
CHEST  1 VIEW

[chest ap]
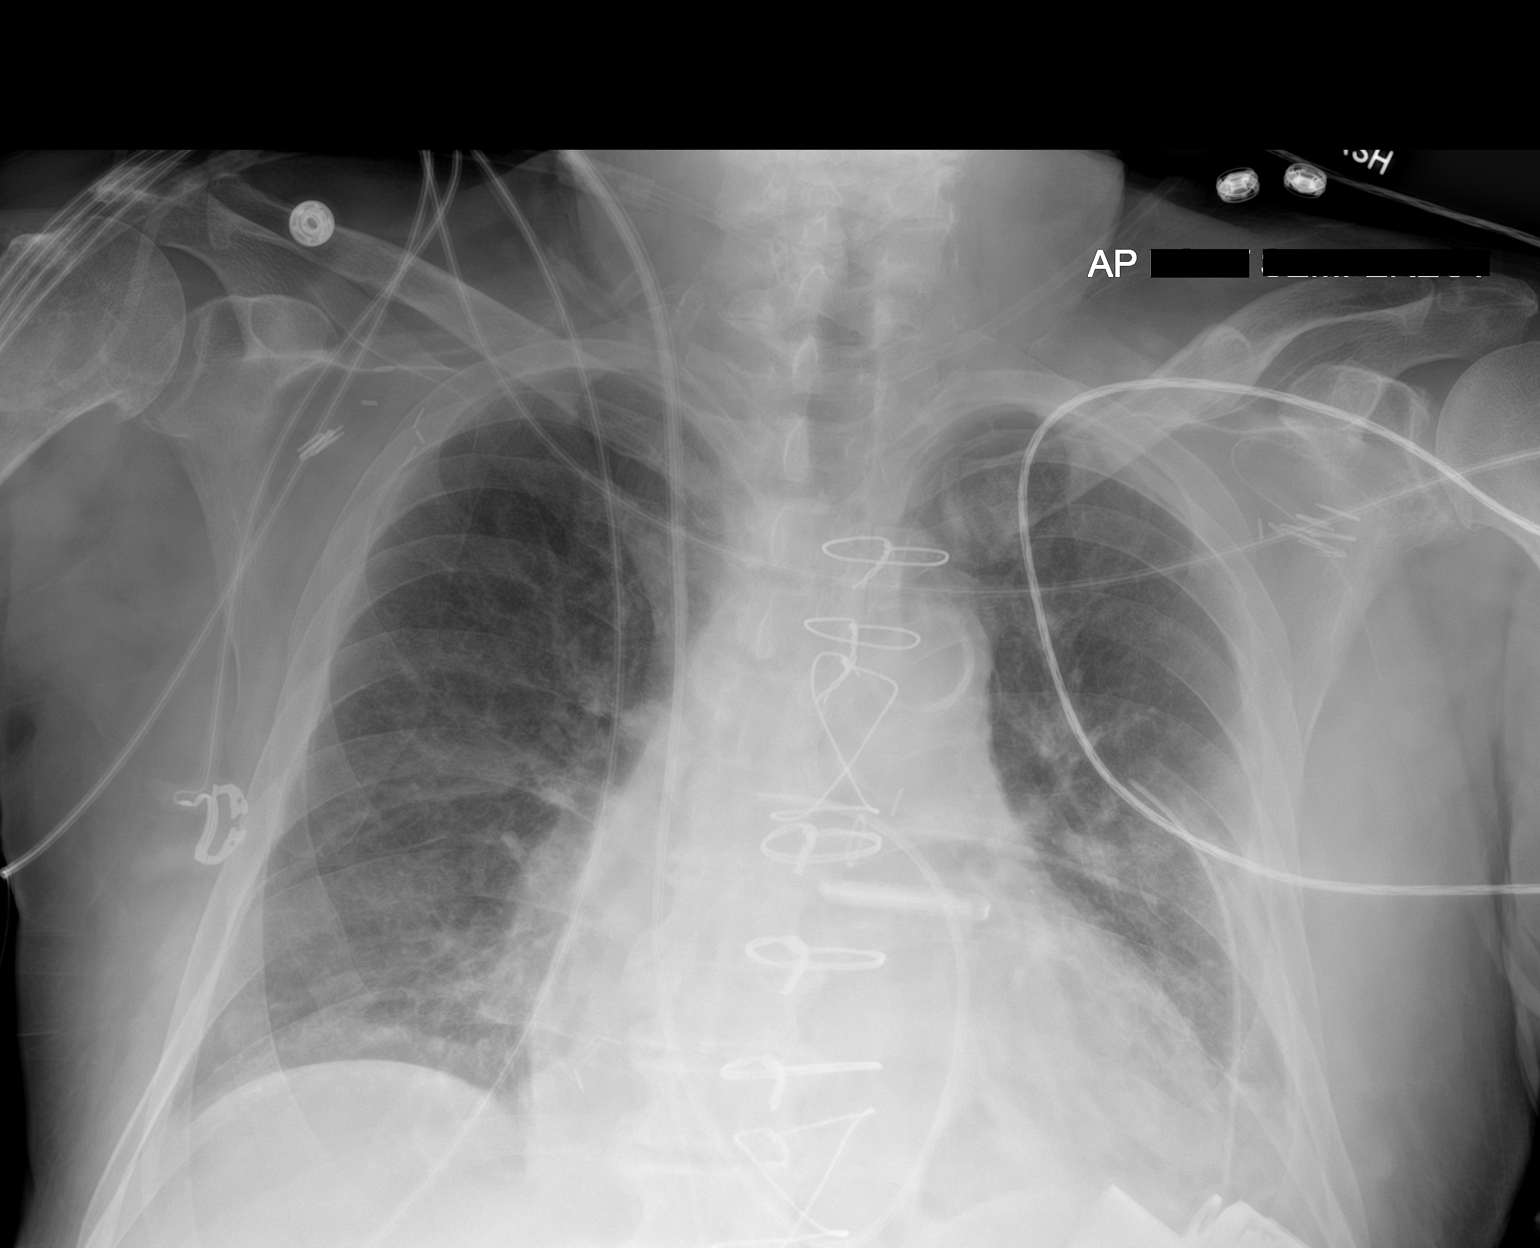

[1 of 1 positions shown; findings below may reference images not displayed]

FINDINGS: Swan-Ganz catheter, mediastinal drainage catheter, and left chest
tube in stable position. Left ventricular assist device stable
position. Prior CABG. Left atrial appendage clip in stable position.
Stable cardiomegaly. Low lung volumes with progressive bibasilar and
left mid lung atelectasis/infiltrates. Tiny left pleural effusion.
No pneumothorax noted on today's exam. Previously identified tiny
left apical pneumothorax is resolved. Surgical clips noted over the
chest bilaterally. Degenerative changes scoliosis thoracic spine.
IMPRESSION: 1. Swan-Ganz catheter, mediastinal drainage catheter, left chest
tube in stable position. Left ventricular assist device in stable
position. Prior CABG. Left atrial appendage clip in stable position.
2. Low lung volumes with progressive bibasilar and left mid lung
atelectasis/infiltrates.
3. Previously identified tiny left apical pneumothorax has resolved.

## 2020-12-16 IMAGING — DX DG CHEST 1V PORT
1 series · 1 of 1 positions shown · non-contrast
Comparison: November 21, 2020.

CLINICAL DATA: Central catheter positioning

EXAM:
PORTABLE CHEST 1 VIEW

[chest ap]
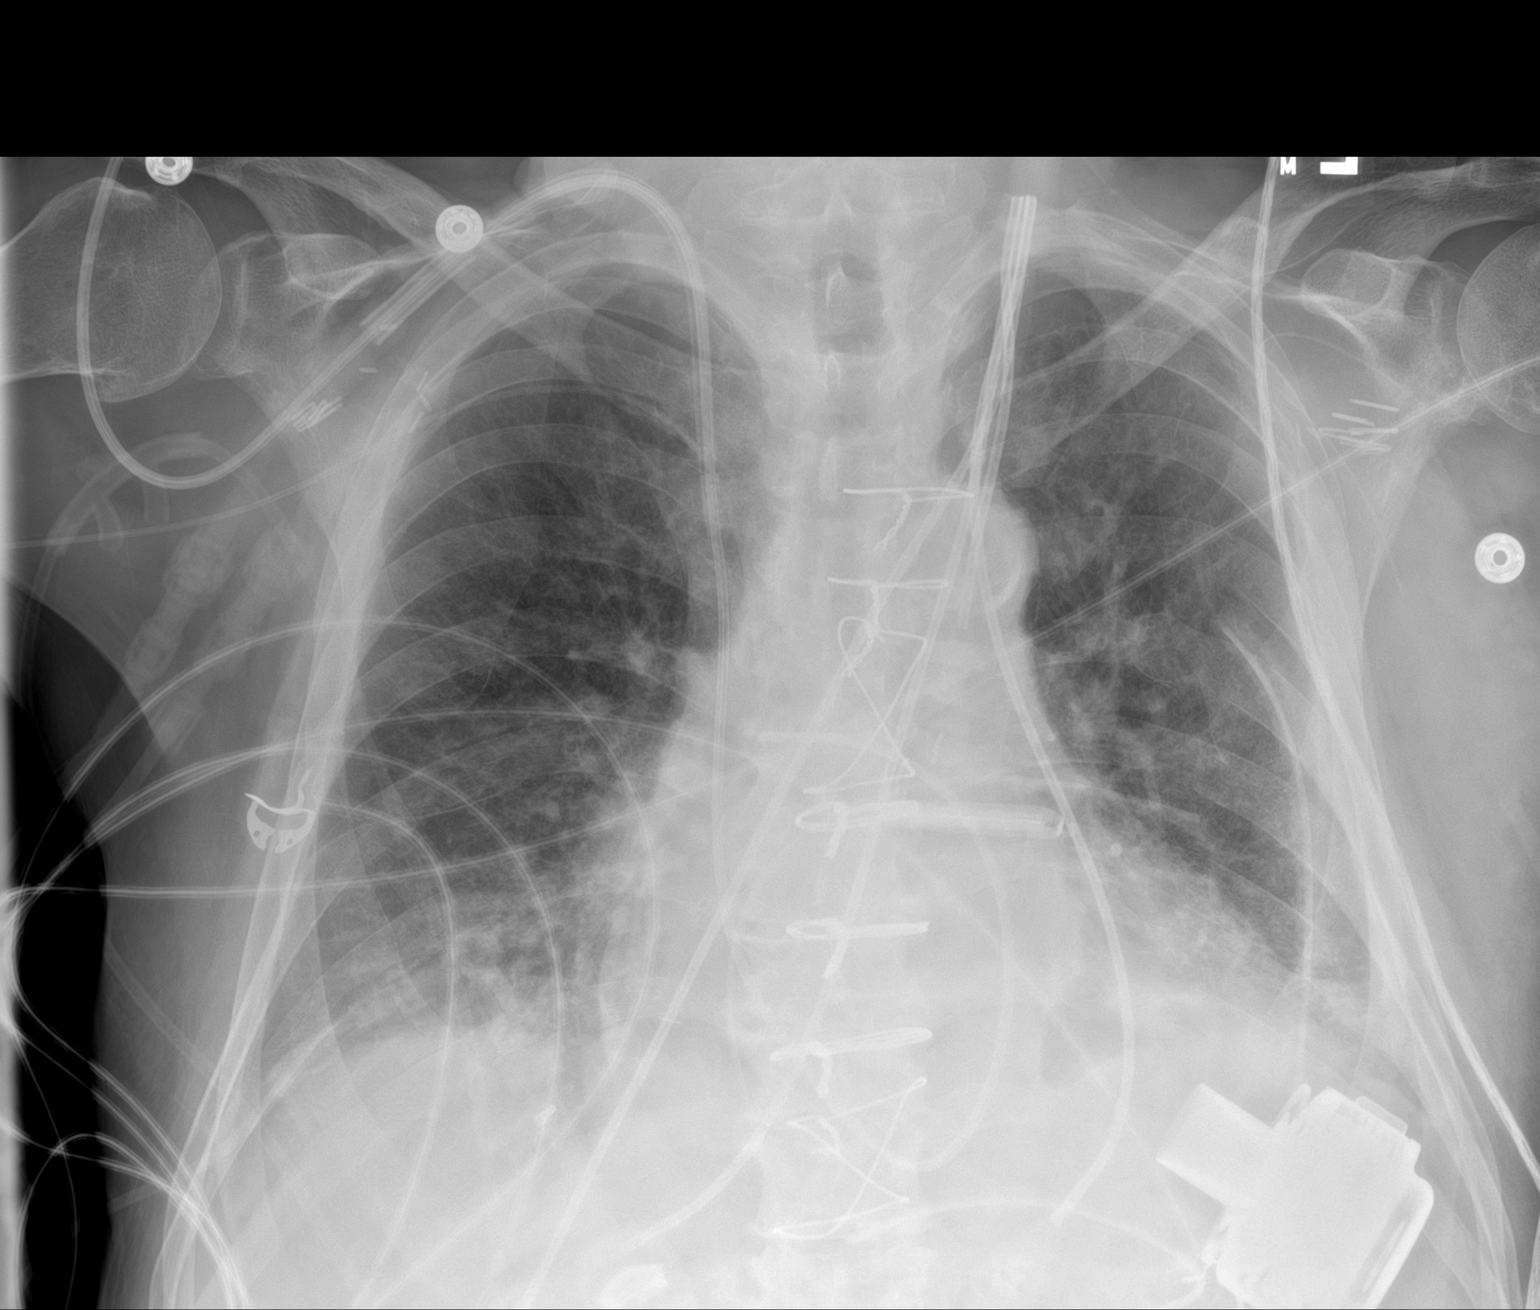

[1 of 1 positions shown; findings below may reference images not displayed]

FINDINGS: Peripherally inserted central catheter on the right has its tip in
the superior vena cava. Swan-Ganz catheter tip is in the right main
pulmonary artery. Chest tube present on the left. Catheter overlies
the left hemithorax with tip overlying inferior heart border. Left
ventricular assist device present. No pneumothorax. Small pleural
effusions noted bilaterally with bibasilar atelectasis. Lungs
elsewhere clear. Heart is prominent but stable. Pulmonary
vascularity is normal. There is aortic atherosclerosis. No bone
lesions. Surgical clips noted in each axillary region.
IMPRESSION: New central catheter tip in superior vena cava. Other tube and
catheter positions as described pneumothorax. Small pleural
effusions bilaterally with bibasilar atelectasis. Stable cardiac
silhouette.

Aortic Atherosclerosis ([id number]).

## 2020-12-17 NOTE — Addendum Note (Signed)
Encounter addended by: Laurey Morale, MD on: 12/17/2020 7:06 PM  Actions taken: Clinical Note Signed, Charge Capture section accepted, Level of Service modified

## 2020-12-18 ENCOUNTER — Encounter (HOSPITAL_COMMUNITY): Payer: Self-pay | Admitting: *Deleted

## 2020-12-18 NOTE — Progress Notes (Unsigned)
Packet faxed to [facility] for home INR machine.  Alyce Pagan RN VAD Coordinator  Office: [phone]  24/7 Pager: [phone]

## 2020-12-22 ENCOUNTER — Other Ambulatory Visit (HOSPITAL_COMMUNITY): Payer: Self-pay | Admitting: Unknown Physician Specialty

## 2020-12-22 DIAGNOSIS — Z95811 Presence of heart assist device: Secondary | ICD-10-CM

## 2020-12-22 DIAGNOSIS — Z7901 Long term (current) use of anticoagulants: Secondary | ICD-10-CM

## 2020-12-27 ENCOUNTER — Other Ambulatory Visit (HOSPITAL_COMMUNITY): Payer: 59

## 2020-12-28 ENCOUNTER — Other Ambulatory Visit (HOSPITAL_COMMUNITY): Payer: Self-pay | Admitting: Unknown Physician Specialty

## 2020-12-28 ENCOUNTER — Telehealth (HOSPITAL_COMMUNITY): Payer: Self-pay

## 2020-12-28 DIAGNOSIS — Z95811 Presence of heart assist device: Secondary | ICD-10-CM

## 2020-12-28 DIAGNOSIS — Z7901 Long term (current) use of anticoagulants: Secondary | ICD-10-CM

## 2020-12-28 NOTE — Telephone Encounter (Incomplete)
Pharmacy Transitions of Care Follow-up Telephone Call  Date of discharge: 11/28/2020  Discharge Diagnosis: Acute on chronic systolic heart failure with cardiogenic shock s/p HeartMate III LVAD 11/18/2020  How have you been since you were released from the hospital? ***   Medication changes made at discharge: yes - new amiodarone, warfarin, colchicine    Medication changes obtained and verified? ***    Medication Accessibility:  Home Pharmacy: [facility] - [phone]   Was the patient provided with refills on discharged medications? ***   Have all prescriptions been transferred from [facility] to home pharmacy? ***   Is the patient able to afford medications? *** . Notable copays: *** . Eligible patient assistance: ***   Follow-up Appointments:  PCP Hospital f/u appt confirmed? *** Scheduled to see *** on *** @ ***.   Specialist Hospital f/u appt confirmed? *** Scheduled to see *** on *** @ ***.   If their condition worsens, is the pt aware to call PCP or go to the Emergency Dept.? ***  Final Patient Assessment: ***

## 2020-12-29 ENCOUNTER — Telehealth (HOSPITAL_COMMUNITY): Payer: Self-pay | Admitting: *Deleted

## 2020-12-29 ENCOUNTER — Other Ambulatory Visit: Payer: Self-pay

## 2020-12-29 ENCOUNTER — Ambulatory Visit (HOSPITAL_COMMUNITY): Payer: Self-pay | Admitting: Pharmacist

## 2020-12-29 ENCOUNTER — Other Ambulatory Visit (HOSPITAL_COMMUNITY): Payer: Self-pay | Admitting: *Deleted

## 2020-12-29 ENCOUNTER — Other Ambulatory Visit (HOSPITAL_COMMUNITY)
Admission: RE | Admit: 2020-12-29 | Discharge: 2020-12-29 | Disposition: A | Discharge status: Home / Self Care | Payer: 59 | Source: Ambulatory Visit | Attending: Cardiology | Admitting: Cardiology

## 2020-12-29 DIAGNOSIS — Z7901 Long term (current) use of anticoagulants: Secondary | ICD-10-CM | POA: Diagnosis present

## 2020-12-29 DIAGNOSIS — Z95811 Presence of heart assist device: Secondary | ICD-10-CM | POA: Insufficient documentation

## 2020-12-29 LAB — PROTIME-INR
INR: 1.5 — ABNORMAL HIGH (ref 0.8–1.2)
Prothrombin Time: 17.3 seconds — ABNORMAL HIGH (ref 11.4–15.2)

## 2020-12-29 MED ORDER — WARFARIN SODIUM 4 MG PO TABS: ORAL_TABLET | ORAL | 11 refills | Status: DC

## 2020-12-29 NOTE — Telephone Encounter (Signed)
Called patient per Karle Plumber, PharmD, who had been unable to reach patient. Reached him at his mother's house.  Patient reports he has had his cell phone turned off and his home phone does not have answering machine.   Informed him to take warfarin 8 mg (two tablets) tonight then increase weekly dose to 4 mg daily except 6 mg on Mon and Fri. Patient wrote down new directions and verbalized understanding of same. Will call VAD clinic or VAD pager if wife has any questions.   Hessie Diener RN, VAD Coordinator [phone]

## 2020-12-29 NOTE — Progress Notes (Signed)
LVAD INR 

## 2020-12-29 NOTE — Telephone Encounter (Cosign Needed)
Pharmacy Transitions of Care Follow-up Telephone Call  Attempted to contact patient for a pharmacy TOC follow-up over the phone on 12/29/2019. Second attempt was made on 12/29/2020.   Will not follow up as he is being followed by Heart Failure clinic for anticoagulation.  Filbert Schilder  [facility] PharmD Candidate [phone]

## 2021-01-04 ENCOUNTER — Other Ambulatory Visit (HOSPITAL_COMMUNITY): Payer: Self-pay | Admitting: *Deleted

## 2021-01-04 DIAGNOSIS — I5023 Acute on chronic systolic (congestive) heart failure: Secondary | ICD-10-CM

## 2021-01-04 DIAGNOSIS — Z95811 Presence of heart assist device: Secondary | ICD-10-CM

## 2021-01-04 DIAGNOSIS — Z7901 Long term (current) use of anticoagulants: Secondary | ICD-10-CM

## 2021-01-08 ENCOUNTER — Encounter (HOSPITAL_COMMUNITY): Payer: Self-pay

## 2021-01-08 ENCOUNTER — Ambulatory Visit (HOSPITAL_COMMUNITY)
Admission: RE | Admit: 2021-01-08 | Discharge: 2021-01-08 | Disposition: A | Discharge status: Home / Self Care | Payer: 59 | Source: Ambulatory Visit | Attending: Cardiology | Admitting: Cardiology

## 2021-01-08 ENCOUNTER — Ambulatory Visit (HOSPITAL_COMMUNITY): Payer: Self-pay | Admitting: Pharmacist

## 2021-01-08 ENCOUNTER — Other Ambulatory Visit: Payer: Self-pay

## 2021-01-08 ENCOUNTER — Other Ambulatory Visit (HOSPITAL_COMMUNITY): Payer: Self-pay | Admitting: *Deleted

## 2021-01-08 VITALS — BP 113/76 | HR 96 | Wt 188.0 lb

## 2021-01-08 DIAGNOSIS — Z95811 Presence of heart assist device: Secondary | ICD-10-CM

## 2021-01-08 DIAGNOSIS — M25512 Pain in left shoulder: Secondary | ICD-10-CM

## 2021-01-08 DIAGNOSIS — E119 Type 2 diabetes mellitus without complications: Secondary | ICD-10-CM

## 2021-01-08 DIAGNOSIS — Z79899 Other long term (current) drug therapy: Secondary | ICD-10-CM | POA: Insufficient documentation

## 2021-01-08 DIAGNOSIS — Z7901 Long term (current) use of anticoagulants: Secondary | ICD-10-CM

## 2021-01-08 DIAGNOSIS — M109 Gout, unspecified: Secondary | ICD-10-CM | POA: Diagnosis not present

## 2021-01-08 DIAGNOSIS — X58XXXA Exposure to other specified factors, initial encounter: Secondary | ICD-10-CM | POA: Diagnosis not present

## 2021-01-08 DIAGNOSIS — J449 Chronic obstructive pulmonary disease, unspecified: Secondary | ICD-10-CM | POA: Insufficient documentation

## 2021-01-08 DIAGNOSIS — T827XXA Infection and inflammatory reaction due to other cardiac and vascular devices, implants and grafts, initial encounter: Secondary | ICD-10-CM

## 2021-01-08 DIAGNOSIS — I2510 Atherosclerotic heart disease of native coronary artery without angina pectoris: Secondary | ICD-10-CM | POA: Insufficient documentation

## 2021-01-08 DIAGNOSIS — G4700 Insomnia, unspecified: Secondary | ICD-10-CM

## 2021-01-08 DIAGNOSIS — I110 Hypertensive heart disease with heart failure: Secondary | ICD-10-CM | POA: Diagnosis not present

## 2021-01-08 DIAGNOSIS — I5022 Chronic systolic (congestive) heart failure: Secondary | ICD-10-CM | POA: Insufficient documentation

## 2021-01-08 DIAGNOSIS — I5023 Acute on chronic systolic (congestive) heart failure: Secondary | ICD-10-CM | POA: Diagnosis not present

## 2021-01-08 DIAGNOSIS — I480 Paroxysmal atrial fibrillation: Secondary | ICD-10-CM | POA: Insufficient documentation

## 2021-01-08 DIAGNOSIS — Z951 Presence of aortocoronary bypass graft: Secondary | ICD-10-CM | POA: Insufficient documentation

## 2021-01-08 LAB — BASIC METABOLIC PANEL
Anion gap: 10 (ref 5–15)
BUN: 21 mg/dL — ABNORMAL HIGH (ref 6–20)
CO2: 25 mmol/L (ref 22–32)
Calcium: 9.6 mg/dL (ref 8.9–10.3)
Chloride: 98 mmol/L (ref 98–111)
Creatinine, Ser: 0.91 mg/dL (ref 0.61–1.24)
GFR, Estimated: >60 mL/min (ref >60)
Glucose, Bld: 424 mg/dL — ABNORMAL HIGH (ref 70–99)
Potassium: 5 mmol/L (ref 3.5–5.1)
Sodium: 133 mmol/L — ABNORMAL LOW (ref 135–145)

## 2021-01-08 LAB — CBC
HCT: 44.9 % (ref 39.0–52.0)
Hemoglobin: 14.4 g/dL (ref 13.0–17.0)
MCH: 27.1 pg (ref 26.0–34.0)
MCHC: 32.1 g/dL (ref 30.0–36.0)
MCV: 84.4 fL (ref 80.0–100.0)
Platelets: 275 10*3/uL (ref 150–400)
RBC: 5.32 MIL/uL (ref 4.22–5.81)
RDW: 14.8 % (ref 11.5–15.5)
WBC: 9.2 10*3/uL (ref 4.0–10.5)
nRBC: 0 % (ref 0.0–0.2)

## 2021-01-08 LAB — PROTIME-INR
INR: 1.8 — ABNORMAL HIGH (ref 0.8–1.2)
Prothrombin Time: 20.1 seconds — ABNORMAL HIGH (ref 11.4–15.2)

## 2021-01-08 LAB — LACTATE DEHYDROGENASE: LDH: 197 U/L — ABNORMAL HIGH (ref 98–192)

## 2021-01-08 MED ORDER — TRAZODONE HCL 150 MG PO TABS: 150.0000 mg | ORAL_TABLET | Freq: Every day | ORAL | 5 refills | Status: DC

## 2021-01-08 MED ORDER — CYCLOBENZAPRINE HCL 5 MG PO TABS: 5.0000 mg | ORAL_TABLET | Freq: Two times a day (BID) | ORAL | 3 refills | Status: DC | PRN

## 2021-01-08 MED ORDER — DOXYCYCLINE HYCLATE 100 MG PO CAPS: 100.0000 mg | ORAL_CAPSULE | Freq: Two times a day (BID) | ORAL | 0 refills | Status: AC

## 2021-01-08 MED ORDER — METFORMIN HCL 1000 MG PO TABS: 1000.0000 mg | ORAL_TABLET | Freq: Two times a day (BID) | ORAL | 4 refills | Status: DC

## 2021-01-08 MED ORDER — WARFARIN SODIUM 4 MG PO TABS
ORAL_TABLET | ORAL | 11 refills | Status: DC
Start: 2021-01-08 — End: 2021-03-27

## 2021-01-08 NOTE — Patient Instructions (Signed)
1. Stop Amiodarone and Aspirin 2. Start Doxycyline 100 mg twice daily for 14 days for drive line infection 3. Coumadin dosing per Ander Purpura PharmD 4. Change driveline dressing every other day using daily kit and silver strip until drainage dries up. Then may change every 3 days.  5. Return to [facility] in 2 weeks for drive line check 6. Return to [place] in 1 month for full follow up appointment

## 2021-01-08 NOTE — Progress Notes (Signed)
LVAD INR 

## 2021-01-08 NOTE — Progress Notes (Addendum)
Patient presents for 3 week follow up in [facility] today with his wife [name]. Reports no problems with VAD equipment or concerns with drive line.  Patient reports he has been feeling "good." Denies shortness of breath, lightheadedness, dizziness, or falls. Reports his appetite has improved, and he is "eating like a horse." Reports sore joints due to the cold weather. He is increasing his activity at home. Per Dr Aundra Dubin, we will refer him to [facility] cardiac rehab.   Wife confirms they stopped ASA after last visit, but reports his "blood was too thick to check his blood sugar" so she restarted. Instructed to stop ASA again today. Per Dr Aundra Dubin will also stop his Amiodarone today. He has not needed to take any Lasix since last clinic appointment.   BP improved today with increased Entresto dose. Increased number of PI events noted on VAD interrogation (70 daily.) Fluid status stable today. Weight is up 10 lbs, but he attributes this to increased appetite.   Reports drive line trauma; controller has fallen off bed several times. Site is itchy, red, with green drainage. See drive line dressing change below. Wound culture sent. Per Dr Aundra Dubin will start Doxycyline 100 mg BID x 14 days.   Provided patient with an answering machine for home use. Instructed [place] to let us know if he has an difficulty setting this up, as we need the answering machine to leave INR instructions if unable to reach him.   Refills sent for Metformin and Trazodone per Dr Aundra Dubin. Paper prescription given to patient for Flexeril refill. Reports he has been out of Metformin for a week; blood sugar 424 today. Instructed patient to make appointment with PCP to discuss regimen.   Vital Signs:  Temp:  Doppler Pressure: 94  Automatc BP: 113/76 (88) HR: 96 SR SPO2: 97%   Weight: 188 lb lb w/ eqt Last weight: 179.4 lb  Home weights: 175 - 180 lbs   VAD Indication: Destination Therapy. HM III implanted 11/17/20 by Dr  Orvan Seen  VAD interrogation: Speed: 5500 Flow: 4.4 Power: 4.2 w    PI: 3.8 Hct: 33  Alarms: none  Events: 70 daily  Fixed speed: 5500 Low speed limit: 5200  Primary controller back up battery due for replacement in 27 months. Back up controller back up battery due for change in 27 months.  I reviewed the LVAD parameters from today and compared the results to the patient's prior recorded data. LVAD interrogation was NEGATIVE for significant power changes, NEGATIVE for clinical alarms and STABLE for PI events/speed drops. No programming changes were made and pump is functioning within specified parameters. Pt is performing daily controller and system monitor self tests along with completing weekly and monthly maintenance for LVAD equipment.  LVAD equipment check completed and is in good working order. Back-up equipment present. Charged back up battery and performed self-test on equipment.   Annual Equipment Maintenance on UBC/PM was performed on 11/30/2020.   Exit Site Care: Drive line is being maintained weekly by patient's wife [name]. Existing VAD dressing removed and site care performed using sterile technique. Drive line exit site cleaned with Chlora prep applicators x 2, allowed to dry, and gauze dressing with silver strip re-applied. Exit site partially incorporated with moderate amount green drainage on site and on biopatch. Wound culture obtained. Wound does not tunnel, but I was able to get entire tip of culture swab underneath drive line. The velour is fully implanted at exit site. Slight redness noted. Slight foul odor  noted. Denies tenderness. Drive line anchor replaced. Pt denies fever or chills. Instructed Tracey to change dressing every other day using daily dressing kit and silver strip. Once drainage clears up may change twice weekly. Will bring back to clinic in 2 weeks (after Doxycycline course completed) for exit site check. Provided with 14 daily kits and silver strips.       BP & Labs:  Doppler 94 - Doppler is reflecting MAP  Hgb 11.9 - No S/S of bleeding. Specifically denies melena/BRBPR or nosebleeds. Occasional blood when wiping.   LDH 197 -  with established baseline of 190 - 360. Denies tea-colored urine. No power elevations noted on interrogation.   Plan: 1. Stop Amiodarone and Aspirin 2. Start Doxycyline 100 mg twice daily for 14 days for drive line infection 3. Coumadin dosing per Leotis Shames PharmD 4. Change driveline dressing every other day using daily kit and silver strip until drainage dries up. Then may change every 3 days.  5. Return to VAD clinic in 2 weeks for drive line check 6. Return to VAD clinic in 1 month for full follow up appointment. Check digoxin level.  7. We will refer you to [facility] cardiac rehab   Alyce Pagan RN VAD Coordinator  Office: [phone]  24/7 Pager: [phone]    PCP: Kirstie Peri, MD Cardiology: Dr. Shirlee Latch  Follow up for Heart Failure/LVAD:  60 y.o. with history of systolic HF,multivessel CAD status post CABG in December 2020(with Maze and LAA clipping)at which point he required Impella support due to cardiogenic shock, paroxysmal atrial fibrillation.type 2 diabetes mellitus, COPD, and hypertension. Now s/p Heartmate 3 LVAD.   Patient had echo in 10/21 with EF 20-25%.  He was admitted in 12/21 with cardiogenic shock.  Cath showed patent grafts and low cardiac output.  Suspect mixed ischemic/nonischemic cardiomyopathy, prior heavy ETOH though no ETOH or smoking since 12/20 CABG.  He required stabilization with Impella 5.5 and ultimately had placement of Heartmate 3 LVAD.  Paroxysmal atrial fibrillation noted post-op, but generally stable course and discharged home.    He has been doing well.  Having a few more PI events than usual over the last few days, but MAP controlled at 88 and denies lightheadedness, etc.  No significant exertional dyspnea.  He can walk up stairs without problems.   Overall feeling good.  He has had some trauma at the driveline site and has some drainage present. He is in NSR today, no palpitations.  Denies LVAD alarms.  Reports taking Coumadin as prescribed and adherence to anticoagulation based dietary restrictions.  Denies bright red blood per rectum or melena, no dark urine or hematuria.    Labs (12/21): K 4.1, creatinine 0.73, INR 2.4, hgb 12.3, LDH 194, LFTs normal, TSH normal Labs (1/22): hgb 14.4   Past Medical History:  Diagnosis Date  . Arthritis   . CAD (coronary artery disease)    a. s/p CABG in 11/2019 with LIMA-LAD, SVG-OM1, SVG-PDA and SVG-D1  . CHF (congestive heart failure) (HCC)    a. EF < 20% by echo in 11/2019  . COPD (chronic obstructive pulmonary disease) (HCC)   . Essential hypertension   . PAF (paroxysmal atrial fibrillation) (HCC)   . Type 2 diabetes mellitus (HCC)     Current Outpatient Medications  Medication Sig Dispense Refill  . albuterol (PROVENTIL) (2.5 MG/3ML) 0.083% nebulizer solution Take 2.5 mg by nebulization every 6 (six) hours as needed for wheezing or shortness of breath.     . blood glucose  meter kit and supplies KIT Dispense based on patient and insurance preference. Use to check CBG's three times a day. (FOR ICD-9 250.00, 250.01). 1 each 0  . COMBIVENT RESPIMAT 20-100 MCG/ACT AERS respimat Inhale 1 puff into the lungs every 6 (six) hours as needed. 4 g 1  . dapagliflozin propanediol (FARXIGA) 5 MG TABS tablet Take 1 tablet (5 mg total) by mouth daily before breakfast. 30 tablet 3  . digoxin (LANOXIN) 0.125 MG tablet TAKE 1 TABLET(0.125 MG) BY MOUTH DAILY (Patient taking differently: Take 0.125 mg by mouth daily.) 90 tablet 1  . doxycycline (VIBRAMYCIN) 100 MG capsule Take 1 capsule (100 mg total) by mouth 2 (two) times daily for 14 days. 28 capsule 0  . gabapentin (NEURONTIN) 600 MG tablet Take 1 tablet (600 mg total) by mouth 3 (three) times daily. 90 tablet 0  . magnesium oxide (MAG-OX) 400 (241.3 Mg) MG  tablet Take 1 tablet (400 mg total) by mouth daily. 30 tablet 2  . mometasone-formoterol (DULERA) 200-5 MCG/ACT AERO Inhale 2 puffs into the lungs in the morning and at bedtime. 1 each 2  . Multiple Vitamin (MULTIVITAMIN WITH MINERALS) TABS tablet Take 1 tablet by mouth daily.    Marland Kitchen OLANZapine (ZYPREXA) 5 MG tablet Take 5 mg by mouth at bedtime.    . pantoprazole (PROTONIX) 40 MG tablet Take 1 tablet (40 mg total) by mouth daily. 30 tablet 2  . potassium chloride SA (KLOR-CON) 20 MEQ tablet Take 2 tablets (40 mEq total) by mouth daily. 60 tablet 3  . rosuvastatin (CRESTOR) 40 MG tablet Take 1 tablet (40 mg total) by mouth daily. 30 tablet 3  . sacubitril-valsartan (ENTRESTO) 49-51 MG Take 1 tablet by mouth 2 (two) times daily. 60 tablet 11  . Tetrahydrozoline HCl (REDNESS RELIEVER EYE DROPS OP) Apply 2 drops to eye daily as needed.    . colchicine 0.6 MG tablet Take 1 tablet (0.6 mg total) by mouth as needed. (Patient not taking: No sig reported) 60 tablet 3  . cyclobenzaprine (FLEXERIL) 5 MG tablet Take 1-2 tablets (5-10 mg total) by mouth 2 (two) times daily as needed for muscle spasms. 45 tablet 3  . furosemide (LASIX) 40 MG tablet Take 0.5 tablets (20 mg total) by mouth daily as needed. (Patient not taking: No sig reported) 30 tablet 5  . metFORMIN (GLUCOPHAGE) 1000 MG tablet Take 1 tablet (1,000 mg total) by mouth in the morning and at bedtime. 60 tablet 4  . traZODone (DESYREL) 150 MG tablet Take 1 tablet (150 mg total) by mouth at bedtime. 45 tablet 5  . warfarin (COUMADIN) 4 MG tablet Take 6 mg (1.5 tabs) Monday/Wednesday/Friday and 4 mg (1 tab) all other days or as directed by HF Clinic 60 tablet 11   No current facility-administered medications for this encounter.    Patient has no known allergies.  REVIEW OF SYSTEMS: All systems negative except as listed in HPI, PMH and Problem list.   LVAD INTERROGATION:  See nurse's note above.   I reviewed the LVAD parameters from today, and  compared the results to the patient's prior recorded data.  No programming changes were made.  The LVAD is functioning within specified parameters.  The patient performs LVAD self-test daily.  LVAD interrogation was negative for any significant power changes, alarms or PI events/speed drops.  LVAD equipment check completed and is in good working order.  Back-up equipment present.   LVAD education done on emergency procedures and precautions and reviewed exit site  care.    Vitals:   01/08/21 1103 01/08/21 1111  BP: (!) 94/0 113/76  Pulse: 96   SpO2: 97%   Weight: 85.3 kg (188 lb)   MAP 88  Physical Exam: General: Well appearing this am. NAD.  HEENT: Normal. Neck: Supple, JVP 7-8 cm. Carotids OK.  Cardiac:  Mechanical heart sounds with LVAD hum present.  Lungs:  CTAB, normal effort.  Abdomen:  NT, ND, no HSM. No bruits or masses. +BS  LVAD exit site: Well-healed and incorporated. Dressing dry and intact. Drainage at driveline site noted. Stabilization device present and accurately applied. Driveline dressing changed daily per sterile technique. Extremities:  Warm and dry. No cyanosis, clubbing, rash, or edema.  Neuro:  Alert & oriented x 3. Cranial nerves grossly intact. Moves all 4 extremities w/o difficulty. Affect pleasant    ASSESSMENT AND PLAN: 1. Chronic systolic CHF: Echo 28/31 with EF 20-25%, mildly decreased RV function. LHC/RHC in 12/21 with patent grafts, low output. Suspect mixed ischemic/nonischemic cardiomyopathy (prior heavy ETOH and drugs as well as CAD). No ETOH, drugs, smoking since CABG in 12/20. Admitted with cardiogenic shock in 12/21, had placement of Impella 5.5 initially, now s/p Heartmate 3 LVAD on 11/17/20.  Doing well symptomatically, NYHA class II. Not volume overloaded on exam. No BRBPR/melena. MAP now controlled.  - Continue to use Lasix 20 mg po daily prn weight gain.   - Continue Entresto 49/51 bid.  - Continue digoxin 0.125. Check level today. - Continue  dapagliflozin.  - He can stop ASA.  - Continue Coumadin.  - Refer for cardiac rehab.  2. CAD: S/p CABG 12/20. LHC this admission with patent grafts, no target for intervention. - Continue statin. 3. Atrial fibrillation: Paroxysmal. S/p Maze and LA appendage clip with CABG in 12/20.  In NSR.  - Stop amiodarone today. 4. Type 2 diabetes: Close followup with PCP.   5. Prior ETOH, drugs: None x  >1 year. 6. Gout: Can use colchicine prn.  7. Suspected driveline site infection: Will obtain culture and start doxycycline 100 mg bid x 14 days.   Loralie Champagne 01/08/2021

## 2021-01-10 LAB — AEROBIC CULTURE W GRAM STAIN (SUPERFICIAL SPECIMEN)
Culture: NO GROWTH
Gram Stain: NONE SEEN

## 2021-01-22 ENCOUNTER — Other Ambulatory Visit (HOSPITAL_COMMUNITY): Payer: Self-pay | Admitting: Cardiology

## 2021-01-22 DIAGNOSIS — G4700 Insomnia, unspecified: Secondary | ICD-10-CM

## 2021-01-23 ENCOUNTER — Ambulatory Visit (HOSPITAL_COMMUNITY)
Admission: RE | Admit: 2021-01-23 | Discharge: 2021-01-23 | Disposition: A | Discharge status: Home / Self Care | Payer: 59 | Source: Ambulatory Visit | Attending: Cardiology | Admitting: Cardiology

## 2021-01-23 ENCOUNTER — Other Ambulatory Visit: Payer: Self-pay

## 2021-01-23 DIAGNOSIS — Z95811 Presence of heart assist device: Secondary | ICD-10-CM | POA: Diagnosis present

## 2021-01-23 DIAGNOSIS — Z7901 Long term (current) use of anticoagulants: Secondary | ICD-10-CM | POA: Diagnosis not present

## 2021-01-23 NOTE — Progress Notes (Signed)
Pt presents to clinic for driveline check with his wife [name].  Exit Site Care:  Drive line is being maintained weeklyby patient's wife [name]. Existing VAD dressing removed and site care performed using sterile technique. Drive line exit site cleaned with Chlora prep applicators x 2, allowed to dry, and gauze dressing withOUT silver strip re-applied. Exit sitepartially incorporated with scant drainage on silver strip.  The velour is fully implanted at exit site. Slight redness noted.  Denies tenderness. Drive line anchor secure. Pt denies fever or chills. Instructed Tracey to change dressing twice weekly using daily dressing kit but NO silver strip due to redness. Pt finished Doxycycline last night. Pt has sufficient dressing kits at home.       Return to clinic in 3 weeks for a visit with Dr. Aundra Dubin.  [name] RN, BSN VAD Coordinator 24/7 Pager [phone]

## 2021-01-24 ENCOUNTER — Other Ambulatory Visit (HOSPITAL_COMMUNITY): Payer: Self-pay | Admitting: Unknown Physician Specialty

## 2021-01-24 DIAGNOSIS — Z95811 Presence of heart assist device: Secondary | ICD-10-CM

## 2021-01-24 DIAGNOSIS — Z7901 Long term (current) use of anticoagulants: Secondary | ICD-10-CM

## 2021-01-25 ENCOUNTER — Ambulatory Visit (HOSPITAL_COMMUNITY): Payer: Self-pay | Admitting: Pharmacist

## 2021-01-25 ENCOUNTER — Other Ambulatory Visit: Payer: Self-pay

## 2021-01-25 ENCOUNTER — Other Ambulatory Visit (HOSPITAL_COMMUNITY)
Admission: RE | Admit: 2021-01-25 | Discharge: 2021-01-25 | Disposition: A | Discharge status: Home / Self Care | Payer: 59 | Source: Ambulatory Visit | Attending: Cardiology | Admitting: Cardiology

## 2021-01-25 DIAGNOSIS — Z7901 Long term (current) use of anticoagulants: Secondary | ICD-10-CM | POA: Diagnosis present

## 2021-01-25 DIAGNOSIS — Z95811 Presence of heart assist device: Secondary | ICD-10-CM | POA: Insufficient documentation

## 2021-01-25 LAB — PROTIME-INR
INR: 3.3 — ABNORMAL HIGH (ref 0.8–1.2)
Prothrombin Time: 32.8 seconds — ABNORMAL HIGH (ref 11.4–15.2)

## 2021-01-25 NOTE — Progress Notes (Signed)
LVAD INR 

## 2021-02-07 ENCOUNTER — Other Ambulatory Visit (HOSPITAL_COMMUNITY): Payer: Self-pay | Admitting: *Deleted

## 2021-02-07 DIAGNOSIS — Z7901 Long term (current) use of anticoagulants: Secondary | ICD-10-CM

## 2021-02-07 DIAGNOSIS — Z95811 Presence of heart assist device: Secondary | ICD-10-CM

## 2021-02-12 ENCOUNTER — Encounter (HOSPITAL_COMMUNITY): Payer: 59

## 2021-02-14 ENCOUNTER — Encounter (HOSPITAL_COMMUNITY): Payer: 59

## 2021-02-19 ENCOUNTER — Encounter (HOSPITAL_COMMUNITY): Payer: 59

## 2021-02-19 ENCOUNTER — Ambulatory Visit (HOSPITAL_COMMUNITY): Payer: Self-pay | Admitting: Pharmacist

## 2021-02-19 LAB — POCT INR: INR: 1.4 — AB (ref 2.0–3.0)

## 2021-02-19 NOTE — Progress Notes (Signed)
LVAD INR 

## 2021-02-26 ENCOUNTER — Ambulatory Visit (HOSPITAL_COMMUNITY)
Admission: RE | Admit: 2021-02-26 | Discharge: 2021-02-26 | Disposition: A | Discharge status: Home / Self Care | Payer: 59 | Source: Ambulatory Visit | Attending: Internal Medicine | Admitting: Internal Medicine

## 2021-02-26 ENCOUNTER — Ambulatory Visit (HOSPITAL_COMMUNITY): Payer: Self-pay | Admitting: Pharmacist

## 2021-02-26 ENCOUNTER — Other Ambulatory Visit: Payer: Self-pay

## 2021-02-26 VITALS — BP 150/96 | HR 102 | Ht 70.0 in | Wt 190.4 lb

## 2021-02-26 DIAGNOSIS — Z951 Presence of aortocoronary bypass graft: Secondary | ICD-10-CM | POA: Diagnosis not present

## 2021-02-26 DIAGNOSIS — Z7984 Long term (current) use of oral hypoglycemic drugs: Secondary | ICD-10-CM | POA: Insufficient documentation

## 2021-02-26 DIAGNOSIS — I480 Paroxysmal atrial fibrillation: Secondary | ICD-10-CM | POA: Diagnosis not present

## 2021-02-26 DIAGNOSIS — I110 Hypertensive heart disease with heart failure: Secondary | ICD-10-CM | POA: Insufficient documentation

## 2021-02-26 DIAGNOSIS — Z7901 Long term (current) use of anticoagulants: Secondary | ICD-10-CM

## 2021-02-26 DIAGNOSIS — J449 Chronic obstructive pulmonary disease, unspecified: Secondary | ICD-10-CM | POA: Insufficient documentation

## 2021-02-26 DIAGNOSIS — E119 Type 2 diabetes mellitus without complications: Secondary | ICD-10-CM | POA: Diagnosis not present

## 2021-02-26 DIAGNOSIS — Z95811 Presence of heart assist device: Secondary | ICD-10-CM | POA: Diagnosis present

## 2021-02-26 DIAGNOSIS — Z79899 Other long term (current) drug therapy: Secondary | ICD-10-CM | POA: Diagnosis not present

## 2021-02-26 DIAGNOSIS — Z7951 Long term (current) use of inhaled steroids: Secondary | ICD-10-CM | POA: Insufficient documentation

## 2021-02-26 DIAGNOSIS — I2510 Atherosclerotic heart disease of native coronary artery without angina pectoris: Secondary | ICD-10-CM | POA: Diagnosis not present

## 2021-02-26 DIAGNOSIS — M109 Gout, unspecified: Secondary | ICD-10-CM | POA: Insufficient documentation

## 2021-02-26 DIAGNOSIS — I5022 Chronic systolic (congestive) heart failure: Secondary | ICD-10-CM | POA: Diagnosis not present

## 2021-02-26 LAB — COMPREHENSIVE METABOLIC PANEL
ALT: 20 U/L (ref 0–44)
AST: 21 U/L (ref 15–41)
Albumin: 3.6 g/dL (ref 3.5–5.0)
Alkaline Phosphatase: 88 U/L (ref 38–126)
Anion gap: 8 (ref 5–15)
BUN: 15 mg/dL (ref 6–20)
CO2: 26 mmol/L (ref 22–32)
Calcium: 9.4 mg/dL (ref 8.9–10.3)
Chloride: 103 mmol/L (ref 98–111)
Creatinine, Ser: 0.76 mg/dL (ref 0.61–1.24)
GFR, Estimated: >60 mL/min (ref >60)
Glucose, Bld: 201 mg/dL — ABNORMAL HIGH (ref 70–99)
Potassium: 3.4 mmol/L — ABNORMAL LOW (ref 3.5–5.1)
Sodium: 137 mmol/L (ref 135–145)
Total Bilirubin: 0.5 mg/dL (ref 0.3–1.2)
Total Protein: 7.1 g/dL (ref 6.5–8.1)

## 2021-02-26 LAB — CBC
HCT: 41.1 % (ref 39.0–52.0)
Hemoglobin: 14.4 g/dL (ref 13.0–17.0)
MCH: 28.3 pg (ref 26.0–34.0)
MCHC: 35 g/dL (ref 30.0–36.0)
MCV: 80.7 fL (ref 80.0–100.0)
Platelets: 228 10*3/uL (ref 150–400)
RBC: 5.09 MIL/uL (ref 4.22–5.81)
RDW: 14.8 % (ref 11.5–15.5)
WBC: 7.3 10*3/uL (ref 4.0–10.5)
nRBC: 0 % (ref 0.0–0.2)

## 2021-02-26 LAB — PROTIME-INR
INR: 1.7 — ABNORMAL HIGH (ref 0.8–1.2)
Prothrombin Time: 19.3 seconds — ABNORMAL HIGH (ref 11.4–15.2)

## 2021-02-26 LAB — MAGNESIUM: Magnesium: 2.4 mg/dL (ref 1.7–2.4)

## 2021-02-26 LAB — DIGOXIN LEVEL: Digoxin Level: <0.2 ng/mL — ABNORMAL LOW (ref 0.8–2.0)

## 2021-02-26 LAB — LACTATE DEHYDROGENASE: LDH: 177 U/L (ref 98–192)

## 2021-02-26 MED ORDER — SACUBITRIL-VALSARTAN 97-103 MG PO TABS: 1.0000 | ORAL_TABLET | Freq: Two times a day (BID) | ORAL | 6 refills | Status: DC

## 2021-02-26 NOTE — Progress Notes (Addendum)
Patient presents for 1 month follow up in [facility] today with his wife [name]. Reports no problems with VAD equipment or concerns with drive line.  Patient reports he has been feeling "good." Denies shortness of breath, lightheadedness, dizziness, or falls.  He is increasing his activity at home. Pt states that he has had some car trouble recently and had to cancel his visit at [facility]. Pt states that he is waiting for them to call him back to reschedule.   Wife confirms they stopped ASA and Amiodarone at his last visit. He has not needed to take any Lasix since last clinic appointment.   BP slightly elevated today after 2 different BP checks. Will increase Entresto today to the 97-103 per Dr. Aundra Dubin.    Vital Signs:  Doppler Pressure: 116  Automatc BP: 138/57 (91) HR: 102  SPO2: 96%   Weight: 190.4 lb w/ eqt Last weight: 188 lb  Home weights: 175 - 180 lbs   VAD Indication: Destination Therapy. HM III implanted 11/17/20 by Dr Orvan Seen  VAD interrogation: Speed: 5500 Flow: 4.0 Power: 4.3 w    PI: 6.7 Hct: 44  Alarms: none  Events: 40-50 daily  Fixed speed: 5500 Low speed limit: 5200  Primary controller back up battery due for replacement in 26 months. Back up controller back up battery due for change in 26 months.  I reviewed the LVAD parameters from today and compared the results to the patient's prior recorded data. LVAD interrogation was NEGATIVE for significant power changes, NEGATIVE for clinical alarms and STABLE for PI events/speed drops. No programming changes were made and pump is functioning within specified parameters. Pt is performing daily controller and system monitor self tests along with completing weekly and monthly maintenance for LVAD equipment.  LVAD equipment check completed and is in good working order. Back-up equipment present. Charged back up battery and performed self-test on equipment.   Annual Equipment Maintenance on UBC/PM was performed  on 11/30/2020.   Exit Site Care: Drive line is being maintained twice weekly by patient's wife [name]. Pt did NOT have an anchor on his driveline today. He states that "we ran out". Existing VAD dressing removed and site care performed using sterile technique. Drive line exit site cleaned with Chlora prep applicators x 2, allowed to dry, and sorbaview dressing with biopatch re-applied. Exit site incorporated and the velour is fully implanted at exit site. No redness, drainage, tenderness or foul odor noted. Drive line anchor replaced. Pt denies fever or chills. Instructed Tracey to advance to weekly dressing changes. Provided with 8 weekly kits.    BP & Labs:  Doppler 116 - Doppler is reflecting MAP  Hgb 14.4 - No S/S of bleeding. Specifically denies melena/BRBPR or nosebleeds. Occasional blood when wiping.   LDH 177 -  with established baseline of 190 - 360. Denies tea-colored urine. No power elevations noted on interrogation.   3 mo. Intermacs follow up completed including:  Quality of Life, KCCQ-12, and Neurocognitive trail making.   Pt completed 1250 feet during 6 minute walk.  Back up controller:  11V backup battery charged during this visit.    [facility] Cardiomyopathy Questionnaire  KCCQ-12 02/26/2021 02/26/2021  1 a. Ability to shower/bathe Slightly limited Slightly limited  1 b. Ability to walk 1 block Not at all limited Not at all limited  1 c. Ability to hurry/jog Slightly limited Slightly limited  2. Edema feet/ankles/legs Never over the past 2 weeks Never over the past 2 weeks  3. Limited by fatigue Never over the past 2 weeks Never over the past 2 weeks  4. Limited by dyspnea Less than once a week Less than once a week  5. Sitting up / on 3+ pillows Never over the past 2 weeks Never over the past 2 weeks  6. Limited enjoyment of life Not limited at all Not limited at all  7. Rest of life w/ symptoms Completely satisfied Completely satisfied  8 a. Participation in  hobbies Did not limit at all Did not limit at all  8 b. Participation in chores N/A, did not do for other reasons N/A, did not do for other reasons  8 c. Visiting family/friends N/A, did not do for other reasons N/A, did not do for other reasons      Plan: 1. Increase Entresto to 97-103 twice a day 2. Crush potassium to increase absorption 3. Stop Digoxin 4. May take Claritin or Zrytec for allergies 5. Advance dressing to weekly using the weekly kit 6. Return to clinic in 2 months   [name] RN [place] Coordinator  Office: [phone]  24/7 Pager: [phone]    PCP: Monico Blitz, MD Cardiology: Dr. Aundra Dubin  Follow up for Heart Failure/LVAD:  60 y.o. with history of systolic HF,multivessel CAD status post CABG in December 2020(with Maze and LAA clipping)at which point he required Impella support due to cardiogenic shock, paroxysmal atrial fibrillation.type 2 diabetes mellitus, COPD, and hypertension. Now s/p Heartmate 3 LVAD.   Patient had echo in 10/21 with EF 20-25%.  He was admitted in 12/21 with cardiogenic shock.  Cath showed patent grafts and low cardiac output.  Suspect mixed ischemic/nonischemic cardiomyopathy, prior heavy ETOH though no ETOH or smoking since 12/20 CABG.  He required stabilization with Impella 5.5 and ultimately had placement of Heartmate 3 LVAD.  Paroxysmal atrial fibrillation noted post-op, but generally stable course and discharged home.    He has been doing well.  Has frequent PIs chronically but having less now than in the past.  He feels great symptomatically, no significant exertional dyspnea. He has not used any Lasix. No lightheadedness.  No limitations to his activity.  Main complaint recently has been allergic rhinitis (rhinorrhea, itchy eyes).  Driveline site looks good. MAP elevated today at 105.   Denies LVAD alarms.  Reports taking Coumadin as prescribed and adherence to anticoagulation based dietary restrictions.  Denies bright red blood  per rectum or melena, no dark urine or hematuria.    Labs (12/21): K 4.1, creatinine 0.73, INR 2.4, hgb 12.3, LDH 194, LFTs normal, TSH normal Labs (1/22): hgb 14.4, creatinine 0.91, LDH 197   Past Medical History:  Diagnosis Date  . Arthritis   . CAD (coronary artery disease)    a. s/p CABG in 11/2019 with LIMA-LAD, SVG-OM1, SVG-PDA and SVG-D1  . CHF (congestive heart failure) ([place])    a. EF < 20% by echo in 11/2019  . COPD (chronic obstructive pulmonary disease) ([place])   . Essential hypertension   . PAF (paroxysmal atrial fibrillation) ([place])   . Type 2 diabetes mellitus ([place])     Current Outpatient Medications  Medication Sig Dispense Refill  . albuterol (PROVENTIL) (2.5 MG/3ML) 0.083% nebulizer solution Take 2.5 mg by nebulization every 6 (six) hours as needed for wheezing or shortness of breath.     . blood glucose meter kit and supplies KIT Dispense based on patient and insurance preference. Use to check CBG's three times a day. (FOR ICD-9 250.00, 250.01). 1 each 0  .  COMBIVENT RESPIMAT 20-100 MCG/ACT AERS respimat Inhale 1 puff into the lungs every 6 (six) hours as needed. 4 g 1  . cyclobenzaprine (FLEXERIL) 5 MG tablet Take 1-2 tablets (5-10 mg total) by mouth 2 (two) times daily as needed for muscle spasms. 45 tablet 3  . dapagliflozin propanediol (FARXIGA) 5 MG TABS tablet Take 1 tablet (5 mg total) by mouth daily before breakfast. 30 tablet 3  . gabapentin (NEURONTIN) 600 MG tablet Take 1 tablet (600 mg total) by mouth 3 (three) times daily. 90 tablet 0  . magnesium oxide (MAG-OX) 400 (241.3 Mg) MG tablet Take 1 tablet (400 mg total) by mouth daily. 30 tablet 2  . metFORMIN (GLUCOPHAGE) 1000 MG tablet Take 1 tablet (1,000 mg total) by mouth in the morning and at bedtime. 60 tablet 4  . mometasone-formoterol (DULERA) 200-5 MCG/ACT AERO Inhale 2 puffs into the lungs in the morning and at bedtime. 1 each 2  . Multiple Vitamin (MULTIVITAMIN WITH MINERALS) TABS tablet Take 1 tablet  by mouth daily.    Marland Kitchen OLANZapine (ZYPREXA) 5 MG tablet Take 5 mg by mouth at bedtime.    . pantoprazole (PROTONIX) 40 MG tablet Take 1 tablet (40 mg total) by mouth daily. 30 tablet 2  . potassium chloride SA (KLOR-CON) 20 MEQ tablet Take 2 tablets (40 mEq total) by mouth daily. 60 tablet 3  . rosuvastatin (CRESTOR) 40 MG tablet Take 1 tablet (40 mg total) by mouth daily. 30 tablet 3  . sacubitril-valsartan (ENTRESTO) 97-103 MG Take 1 tablet by mouth 2 (two) times daily. 60 tablet 6  . Tetrahydrozoline HCl (REDNESS RELIEVER EYE DROPS OP) Apply 2 drops to eye daily as needed.    . traZODone (DESYREL) 150 MG tablet TAKE 1 TABLET BY MOUTH AT BEDTIME. 90 tablet 3  . warfarin (COUMADIN) 4 MG tablet Take 6 mg (1.5 tabs) Monday/Wednesday/Friday and 4 mg (1 tab) all other days or as directed by HF Clinic 60 tablet 11  . colchicine 0.6 MG tablet Take 1 tablet (0.6 mg total) by mouth as needed. (Patient not taking: No sig reported) 60 tablet 3  . furosemide (LASIX) 40 MG tablet Take 0.5 tablets (20 mg total) by mouth daily as needed. (Patient not taking: No sig reported) 30 tablet 5   No current facility-administered medications for this encounter.    Patient has no known allergies.  REVIEW OF SYSTEMS: All systems negative except as listed in HPI, PMH and Problem list.   LVAD INTERROGATION:  See nurse's note above.   I reviewed the LVAD parameters from today, and compared the results to the patient's prior recorded data.  No programming changes were made.  The LVAD is functioning within specified parameters.  The patient performs LVAD self-test daily.  LVAD interrogation was negative for any significant power changes, alarms or PI events/speed drops.  LVAD equipment check completed and is in good working order.  Back-up equipment present.   LVAD education done on emergency procedures and precautions and reviewed exit site care.    Vitals:   02/26/21 1422 02/26/21 1423 02/26/21 1424  BP: (!) 138/57  (!) 116/0 (!) 150/96  Pulse: (!) 102    SpO2: 96%    Weight: 86.4 kg (190 lb 6.4 oz)    Height: $Remove'5\' 10"'[id number]$  (1.778 m)    MAP 88  Physical Exam: General: Well appearing this am. NAD.  HEENT: Normal. Neck: Supple, JVP 7-8 cm. Carotids OK.  Cardiac:  Mechanical heart sounds with LVAD hum present.  Lungs:  CTAB, normal effort.  Abdomen:  NT, ND, no HSM. No bruits or masses. +BS  LVAD exit site: Well-healed and incorporated. Dressing dry and intact. No erythema or drainage. Stabilization device present and accurately applied. Driveline dressing changed daily per sterile technique. Extremities:  Warm and dry. No cyanosis, clubbing, rash, or edema.  Neuro:  Alert & oriented x 3. Cranial nerves grossly intact. Moves all 4 extremities w/o difficulty. Affect pleasant     ASSESSMENT AND PLAN: 1. Chronic systolic CHF: Echo 17/61 with EF 20-25%, mildly decreased RV function. LHC/RHC in 12/21 with patent grafts, low output. Suspect mixed ischemic/nonischemic cardiomyopathy (prior heavy ETOH and drugs as well as CAD). No ETOH, drugs, smoking since CABG in 12/20. Admitted with cardiogenic shock in 12/21, had placement of Impella 5.5 initially, now s/p Heartmate 3 LVAD on 11/17/20.  Doing well symptomatically, NYHA class II. Not volume overloaded on exam. No BRBPR/melena. MAP still elevated.   - Continue to use Lasix 20 mg po daily prn weight gain.   - Increase Entresto to 97/103 bid.  - Continue digoxin 0.125. Check level today. - Continue dapagliflozin.  - Continue Coumadin, INR 2-2.5.  2. CAD: S/p CABG 12/20. LHC pre-VAD with patent grafts, no target for intervention. - Continue statin. 3. Atrial fibrillation: Paroxysmal. S/p Maze and LA appendage clip with CABG in 12/20.  In NSR.  4. Type 2 diabetes: Close followup with PCP.   5. Prior ETOH, drugs: None x  >1 year. 6. Gout: Can use colchicine prn.   Loralie Champagne 02/26/2021

## 2021-02-26 NOTE — Progress Notes (Signed)
LVAD INR 

## 2021-02-26 NOTE — Patient Instructions (Signed)
1. Increase Entresto to 97-103 twice a day 2. Nay take Claritin or Zrytec for allergies 3. Advance dressing to weekly using the weekly kit 4. Return to clinic in 2 months

## 2021-02-28 ENCOUNTER — Ambulatory Visit (HOSPITAL_COMMUNITY): Payer: Self-pay | Admitting: Pharmacist

## 2021-02-28 LAB — POCT INR: INR: 2.4 (ref 2.0–3.0)

## 2021-02-28 NOTE — Progress Notes (Signed)
LVAD INR 

## 2021-03-05 ENCOUNTER — Ambulatory Visit (HOSPITAL_COMMUNITY): Payer: Self-pay | Admitting: Pharmacist

## 2021-03-05 LAB — POCT INR: INR: 2.3 (ref 2.0–3.0)

## 2021-03-05 NOTE — Progress Notes (Signed)
LVAD INR 

## 2021-03-12 ENCOUNTER — Other Ambulatory Visit (HOSPITAL_COMMUNITY): Payer: Self-pay | Admitting: Cardiology

## 2021-03-12 ENCOUNTER — Ambulatory Visit (HOSPITAL_COMMUNITY): Payer: Self-pay | Admitting: Pharmacist

## 2021-03-12 LAB — POCT INR: INR: 1.4 — AB (ref 2.0–3.0)

## 2021-03-12 MED ORDER — ENOXAPARIN SODIUM 40 MG/0.4ML ~~LOC~~ SOLN: 40.0000 mg | Freq: Two times a day (BID) | SUBCUTANEOUS | 0 refills | Status: DC

## 2021-03-12 NOTE — Progress Notes (Signed)
LVAD INR 

## 2021-03-19 ENCOUNTER — Ambulatory Visit (HOSPITAL_COMMUNITY): Payer: Self-pay | Admitting: Pharmacist

## 2021-03-19 LAB — POCT INR: INR: 1.9 — AB (ref 2.0–3.0)

## 2021-03-19 NOTE — Progress Notes (Signed)
LVAD INR 

## 2021-03-27 ENCOUNTER — Ambulatory Visit (HOSPITAL_COMMUNITY): Payer: Self-pay | Admitting: Pharmacist

## 2021-03-27 DIAGNOSIS — Z95811 Presence of heart assist device: Secondary | ICD-10-CM

## 2021-03-27 DIAGNOSIS — Z7901 Long term (current) use of anticoagulants: Secondary | ICD-10-CM

## 2021-03-27 LAB — POCT INR: INR: 1.6 — AB (ref 2.0–3.0)

## 2021-03-27 MED ORDER — WARFARIN SODIUM 4 MG PO TABS: ORAL_TABLET | ORAL | 11 refills | Status: DC

## 2021-03-27 NOTE — Progress Notes (Signed)
LVAD INR 

## 2021-04-02 ENCOUNTER — Ambulatory Visit (HOSPITAL_COMMUNITY): Payer: Self-pay | Admitting: Pharmacist

## 2021-04-02 LAB — POCT INR: INR: 2.6 (ref 2.0–3.0)

## 2021-04-02 NOTE — Progress Notes (Signed)
LVAD INR 

## 2021-04-09 ENCOUNTER — Ambulatory Visit (HOSPITAL_COMMUNITY): Payer: Self-pay | Admitting: Pharmacist

## 2021-04-09 LAB — POCT INR: INR: 2 (ref 2.0–3.0)

## 2021-04-09 NOTE — Progress Notes (Signed)
LVAD INR 

## 2021-04-16 LAB — POCT INR: INR: 2.7 (ref 2.0–3.0)

## 2021-04-17 ENCOUNTER — Ambulatory Visit (HOSPITAL_COMMUNITY): Payer: Self-pay

## 2021-04-17 NOTE — Progress Notes (Signed)
LVAD INR 

## 2021-04-23 ENCOUNTER — Ambulatory Visit (HOSPITAL_COMMUNITY): Payer: Self-pay

## 2021-04-23 LAB — POCT INR: INR: 2.7 (ref 2.0–3.0)

## 2021-04-23 NOTE — Progress Notes (Signed)
LVAD INR 

## 2021-04-27 ENCOUNTER — Other Ambulatory Visit (HOSPITAL_COMMUNITY): Payer: Self-pay | Admitting: *Deleted

## 2021-04-27 DIAGNOSIS — Z7901 Long term (current) use of anticoagulants: Secondary | ICD-10-CM

## 2021-04-27 DIAGNOSIS — Z95811 Presence of heart assist device: Secondary | ICD-10-CM

## 2021-04-30 ENCOUNTER — Ambulatory Visit (HOSPITAL_COMMUNITY): Payer: Self-pay

## 2021-04-30 ENCOUNTER — Encounter (HOSPITAL_COMMUNITY): Payer: 59

## 2021-04-30 DIAGNOSIS — Z7901 Long term (current) use of anticoagulants: Secondary | ICD-10-CM

## 2021-04-30 DIAGNOSIS — Z95811 Presence of heart assist device: Secondary | ICD-10-CM

## 2021-04-30 LAB — POCT INR: INR: 2.4 (ref 2.0–3.0)

## 2021-04-30 MED ORDER — WARFARIN SODIUM 4 MG PO TABS
ORAL_TABLET | ORAL | 11 refills | Status: DC
Start: 2021-04-30 — End: 2021-05-22

## 2021-04-30 NOTE — Progress Notes (Signed)
LVAD INR 

## 2021-05-03 ENCOUNTER — Other Ambulatory Visit: Payer: Self-pay

## 2021-05-03 ENCOUNTER — Ambulatory Visit (HOSPITAL_COMMUNITY)
Admission: RE | Admit: 2021-05-03 | Discharge: 2021-05-03 | Disposition: A | Discharge status: Home / Self Care | Payer: 59 | Source: Ambulatory Visit | Attending: Internal Medicine | Admitting: Internal Medicine

## 2021-05-03 ENCOUNTER — Ambulatory Visit (HOSPITAL_COMMUNITY): Payer: Self-pay

## 2021-05-03 VITALS — BP 98/76 | HR 98 | Ht 70.0 in | Wt 188.0 lb

## 2021-05-03 DIAGNOSIS — Z951 Presence of aortocoronary bypass graft: Secondary | ICD-10-CM | POA: Diagnosis not present

## 2021-05-03 DIAGNOSIS — E1140 Type 2 diabetes mellitus with diabetic neuropathy, unspecified: Secondary | ICD-10-CM | POA: Insufficient documentation

## 2021-05-03 DIAGNOSIS — R42 Dizziness and giddiness: Secondary | ICD-10-CM | POA: Diagnosis not present

## 2021-05-03 DIAGNOSIS — M109 Gout, unspecified: Secondary | ICD-10-CM | POA: Diagnosis not present

## 2021-05-03 DIAGNOSIS — J449 Chronic obstructive pulmonary disease, unspecified: Secondary | ICD-10-CM | POA: Insufficient documentation

## 2021-05-03 DIAGNOSIS — Z7901 Long term (current) use of anticoagulants: Secondary | ICD-10-CM

## 2021-05-03 DIAGNOSIS — Z95811 Presence of heart assist device: Secondary | ICD-10-CM | POA: Diagnosis not present

## 2021-05-03 DIAGNOSIS — Z7951 Long term (current) use of inhaled steroids: Secondary | ICD-10-CM | POA: Insufficient documentation

## 2021-05-03 DIAGNOSIS — R0602 Shortness of breath: Secondary | ICD-10-CM | POA: Diagnosis present

## 2021-05-03 DIAGNOSIS — Z79899 Other long term (current) drug therapy: Secondary | ICD-10-CM | POA: Insufficient documentation

## 2021-05-03 DIAGNOSIS — I2510 Atherosclerotic heart disease of native coronary artery without angina pectoris: Secondary | ICD-10-CM | POA: Diagnosis not present

## 2021-05-03 DIAGNOSIS — I480 Paroxysmal atrial fibrillation: Secondary | ICD-10-CM | POA: Insufficient documentation

## 2021-05-03 DIAGNOSIS — Z7984 Long term (current) use of oral hypoglycemic drugs: Secondary | ICD-10-CM | POA: Insufficient documentation

## 2021-05-03 DIAGNOSIS — I110 Hypertensive heart disease with heart failure: Secondary | ICD-10-CM | POA: Diagnosis not present

## 2021-05-03 DIAGNOSIS — I5022 Chronic systolic (congestive) heart failure: Secondary | ICD-10-CM | POA: Diagnosis not present

## 2021-05-03 LAB — FERRITIN: Ferritin: 142 ng/mL (ref 24–336)

## 2021-05-03 LAB — MAGNESIUM: Magnesium: 2.5 mg/dL — ABNORMAL HIGH (ref 1.7–2.4)

## 2021-05-03 LAB — COMPREHENSIVE METABOLIC PANEL WITH GFR
ALT: 31 U/L (ref 0–44)
AST: 27 U/L (ref 15–41)
Albumin: 3.7 g/dL (ref 3.5–5.0)
Alkaline Phosphatase: 109 U/L (ref 38–126)
Anion gap: 11 (ref 5–15)
BUN: 26 mg/dL — ABNORMAL HIGH (ref 6–20)
CO2: 29 mmol/L (ref 22–32)
Calcium: 9.2 mg/dL (ref 8.9–10.3)
Chloride: 96 mmol/L — ABNORMAL LOW (ref 98–111)
Creatinine, Ser: 1.44 mg/dL — ABNORMAL HIGH (ref 0.61–1.24)
GFR, Estimated: 56 mL/min — ABNORMAL LOW
Glucose, Bld: 299 mg/dL — ABNORMAL HIGH (ref 70–99)
Potassium: 3.8 mmol/L (ref 3.5–5.1)
Sodium: 136 mmol/L (ref 135–145)
Total Bilirubin: 0.7 mg/dL (ref 0.3–1.2)
Total Protein: 7.1 g/dL (ref 6.5–8.1)

## 2021-05-03 LAB — VITAMIN B12: Vitamin B-12: 461 pg/mL (ref 180–914)

## 2021-05-03 LAB — CBC
HCT: 44.1 % (ref 39.0–52.0)
Hemoglobin: 15 g/dL (ref 13.0–17.0)
MCH: 29.1 pg (ref 26.0–34.0)
MCHC: 34 g/dL (ref 30.0–36.0)
MCV: 85.6 fL (ref 80.0–100.0)
Platelets: 230 K/uL (ref 150–400)
RBC: 5.15 MIL/uL (ref 4.22–5.81)
RDW: 15.4 % (ref 11.5–15.5)
WBC: 6 K/uL (ref 4.0–10.5)
nRBC: 0 % (ref 0.0–0.2)

## 2021-05-03 LAB — IRON AND TIBC
Iron: 58 ug/dL (ref 45–182)
Saturation Ratios: 15 % — ABNORMAL LOW (ref 17.9–39.5)
TIBC: 395 ug/dL (ref 250–450)
UIBC: 337 ug/dL

## 2021-05-03 LAB — FOLATE: Folate: 30.8 ng/mL (ref >5.9)

## 2021-05-03 LAB — PROTIME-INR
INR: 3.1 — ABNORMAL HIGH (ref 0.8–1.2)
Prothrombin Time: 31.8 s — ABNORMAL HIGH (ref 11.4–15.2)

## 2021-05-03 LAB — LACTATE DEHYDROGENASE: LDH: 177 U/L (ref 98–192)

## 2021-05-03 LAB — PREALBUMIN: Prealbumin: 29.4 mg/dL (ref 18–38)

## 2021-05-03 MED ORDER — ENTRESTO 49-51 MG PO TABS: 1.0000 | ORAL_TABLET | Freq: Two times a day (BID) | ORAL | 6 refills | Status: DC

## 2021-05-03 NOTE — Patient Instructions (Signed)
1. STOP amiodarone 2. STOP Lasix 3. Increase Gabapentin to 3x 4. Decrease Entresto 49-51 twice a day 5. Return to clinic in 2 mths

## 2021-05-03 NOTE — Progress Notes (Signed)
LVAD INR 

## 2021-05-03 NOTE — Progress Notes (Addendum)
Patient presents for 2 month follow up in VAD Clinic today with his wife [name]. Reports no problems with VAD equipment or concerns with drive line.  Patient reports he has been feeling "good." pt reports that he only gest SOB when climbing a lot of stairs. Wife and pt state that he is still taking Amiodarone although at his last visit they said he had d/cd. Wife/pt states that he had to take lasix x 3 days last week for swelling in his hands.    BP is on the lower side today, doppler drops to 68 when standing. Pt has 110+ PI for today only. States that he does get dizzy from time to time and especially if he stands quickly.  Pt has wrapped his driveline in tape multiple times "so that I can shower." all tape was removed but the plastic near the modular cable was torn from all the tape removal. I placed rescue tape on this area.  I spoke with the wife today on the phone after clinic to give further instructions based on his labs. She tells me that the pt is very depressed and is having a difficult time adjusting to the equipment. I spoke with her about an antidepressant and she ensures me that she will talk to Chanetta Marshall once he gets home and will our clinic if he decides he would like to try medication.  Vital Signs:  Doppler Pressure: 80  Automatc BP: 98/76 (83) HR: 98 SPO2: UTO   Weight: 188 lb w/ eqt Last weight: 190.4 lb  Home weights: 175 - 180 lbs   VAD Indication: Destination Therapy. HM III implanted 11/17/20 by Dr Vickey Sages  VAD interrogation: Speed: 5500 Flow: 4.0 Power: 4.1 w    PI: 7.4 Hct: 44  Alarms: none  Events: 110+ today  Fixed speed: 5500 Low speed limit: 5200  Primary controller back up battery due for replacement in 23 months. Back up controller back up battery due for change in 23 months.  I reviewed the LVAD parameters from today and compared the results to the patient's prior recorded data. LVAD interrogation was NEGATIVE for significant power changes,  NEGATIVE for clinical alarms and STABLE for PI events/speed drops. No programming changes were made and pump is functioning within specified parameters. Pt is performing daily controller and system monitor self tests along with completing weekly and monthly maintenance for LVAD equipment.  LVAD equipment check completed and is in good working order. Back-up equipment present. Charged back up battery and performed self-test on equipment.   Annual Equipment Maintenance on UBC/PM was performed on 11/30/2020.   Exit Site Care: Drive line is being maintained weekly by patient's wife [name]. Existing VAD dressing removed and site care performed using sterile technique. Drive line exit site cleaned with Chlora prep applicators x 2, allowed to dry, and sorbaview dressing with biopatch re-applied. Exit site incorporated and the velour is fully implanted at exit site. No redness, drainage, tenderness or foul odor noted. Drive line anchor replaced. Pt denies fever or chills. Provided with 8 weekly kits. Pt has been showering without permission, but pt instructed he may shower, given shower bag today and taught how to use, handout provided.   BP & Labs:  Doppler 80 - Doppler is reflecting MAP  Hgb 15 - No S/S of bleeding. Specifically denies melena/BRBPR or nosebleeds. Occasional blood when wiping.   LDH 177 -  with established baseline of 190 - 360. Denies tea-colored urine. No power elevations noted on interrogation.  6 mo Intermacs follow up completed including:  Quality of Life, KCCQ-12 over the phone.   [facility] Cardiomyopathy Questionnaire  KCCQ-12 05/03/2021 02/26/2021 02/26/2021  1 a. Ability to shower/bathe Not at all limited Slightly limited Slightly limited  1 b. Ability to walk 1 block Not at all limited Not at all limited Not at all limited  1 c. Ability to hurry/jog Extremely limited Slightly limited Slightly limited  2. Edema feet/ankles/legs Less than once a week Never over the past 2  weeks Never over the past 2 weeks  3. Limited by fatigue All of the time Never over the past 2 weeks Never over the past 2 weeks  4. Limited by dyspnea Several times a day Less than once a week Less than once a week  5. Sitting up / on 3+ pillows 3+ times a week, not every day Never over the past 2 weeks Never over the past 2 weeks  6. Limited enjoyment of life Limited quite a bit Not limited at all Not limited at all  7. Rest of life w/ symptoms Not at all satisfied Completely satisfied Completely satisfied  8 a. Participation in hobbies Limited quite a bit Did not limit at all Did not limit at all  8 b. Participation in chores Moderately limited N/A, did not do for other reasons N/A, did not do for other reasons  8 c. Visiting family/friends Slightly limited N/A, did not do for other reasons N/A, did not do for other reasons      Plan: 1. STOP amiodarone 2. STOP Lasix 3. Increase Gabapentin to 3x 4. HOLD Entresto tonight and tomorrow and then decrease Entresto 49-51 twice a day 5. Return to clinic in 2 mths   Follow up for Heart Failure/LVAD:  60 y.o. with history of systolic HF,multivessel CAD status post CABG in December 2020(with Maze and LAA clipping)at which point he required Impella support due to cardiogenic shock, paroxysmal atrial fibrillation.type 2 diabetes mellitus, COPD, and hypertension. Now s/p Heartmate 3 LVAD.   Patient had echo in 10/21 with EF 20-25%.  He was admitted in 12/21 with cardiogenic shock.  Cath showed patent grafts and low cardiac output.  Suspect mixed ischemic/nonischemic cardiomyopathy, prior heavy ETOH though no ETOH or smoking since 12/20 CABG.  He required stabilization with Impella 5.5 and ultimately had placement of Heartmate 3 LVAD.  Paroxysmal atrial fibrillation noted post-op, but generally stable course and discharged home.    Patient returns for LVAD followup.  He has had frequent PIs recently on device interrogation.  Says he took Lasix 3  days last week for "puffy hands."  He is orthostatic today in the office, MAP fell to 68 with standing.  Feeling good in general.  Notes mild lightheadedness with standing.  No dyspnea walking on flat ground, mild dyspnea with stairs.  No chest pain.  Mild generalized fatigue.  He has pain in his feet that has been attributed to neuropathy.   Denies LVAD alarms.  Reports taking Coumadin as prescribed and adherence to anticoagulation based dietary restrictions.  Denies bright red blood per rectum or melena, no dark urine or hematuria.    Labs (12/21): K 4.1, creatinine 0.73, INR 2.4, hgb 12.3, LDH 194, LFTs normal, TSH normal Labs (1/22): hgb 14.4, creatinine 0.91, LDH 197 Labs (5/22): K 3.8, creatinine 1.44, hgb 15, LDH 177   Past Medical History:  Diagnosis Date  . Arthritis   . CAD (coronary artery disease)    a. s/p CABG in 11/2019  with LIMA-LAD, SVG-OM1, SVG-PDA and SVG-D1  . CHF (congestive heart failure) (HCC)    a. EF < 20% by echo in 11/2019  . COPD (chronic obstructive pulmonary disease) ([place])   . Essential hypertension   . PAF (paroxysmal atrial fibrillation) ([place])   . Type 2 diabetes mellitus ([place])     Current Outpatient Medications  Medication Sig Dispense Refill  . albuterol (PROVENTIL) (2.5 MG/3ML) 0.083% nebulizer solution Take 2.5 mg by nebulization every 6 (six) hours as needed for wheezing or shortness of breath.     . blood glucose meter kit and supplies KIT Dispense based on patient and insurance preference. Use to check CBG's three times a day. (FOR ICD-9 250.00, 250.01). 1 each 0  . COMBIVENT RESPIMAT 20-100 MCG/ACT AERS respimat Inhale 1 puff into the lungs every 6 (six) hours as needed. 4 g 1  . cyclobenzaprine (FLEXERIL) 5 MG tablet Take 1-2 tablets (5-10 mg total) by mouth 2 (two) times daily as needed for muscle spasms. 45 tablet 3  . dapagliflozin propanediol (FARXIGA) 5 MG TABS tablet Take 1 tablet (5 mg total) by mouth daily before breakfast. 30 tablet 3  .  enoxaparin (LOVENOX) 40 MG/0.4ML injection INJECT 0.4 MLS (40 MG TOTAL) INTO THE SKIN EVERY 12 (TWELVE) HOURS. (Patient not taking: Reported on 05/03/2021) 10 mL 2  . gabapentin (NEURONTIN) 600 MG tablet Take 1 tablet (600 mg total) by mouth 3 (three) times daily. 90 tablet 0  . metFORMIN (GLUCOPHAGE) 1000 MG tablet Take 1 tablet (1,000 mg total) by mouth in the morning and at bedtime. 60 tablet 4  . mometasone-formoterol (DULERA) 200-5 MCG/ACT AERO Inhale 2 puffs into the lungs in the morning and at bedtime. 1 each 2  . Multiple Vitamin (MULTIVITAMIN WITH MINERALS) TABS tablet Take 1 tablet by mouth daily.    Marland Kitchen OLANZapine (ZYPREXA) 5 MG tablet Take 5 mg by mouth at bedtime.    . rosuvastatin (CRESTOR) 40 MG tablet Take 1 tablet (40 mg total) by mouth daily. 30 tablet 3  . sacubitril-valsartan (ENTRESTO) 49-51 MG Take 1 tablet by mouth 2 (two) times daily. 60 tablet 6  . Tetrahydrozoline HCl (REDNESS RELIEVER EYE DROPS OP) Apply 2 drops to eye daily as needed.    . traZODone (DESYREL) 150 MG tablet TAKE 1 TABLET BY MOUTH AT BEDTIME. 90 tablet 3  . warfarin (COUMADIN) 4 MG tablet Take 6 mg (1.5 tabs) on Monday/Wednesday/Friday and 4 mg (1 tab) all other days or as directed by HF Clinic 60 tablet 11  . colchicine 0.6 MG tablet Take 1 tablet (0.6 mg total) by mouth as needed. (Patient not taking: No sig reported) 60 tablet 3  . pantoprazole (PROTONIX) 40 MG tablet Take 1 tablet (40 mg total) by mouth daily. (Patient not taking: Reported on 05/03/2021) 30 tablet 2  . potassium chloride SA (KLOR-CON) 20 MEQ tablet Take 2 tablets (40 mEq total) by mouth daily. (Patient not taking: Reported on 05/03/2021) 60 tablet 3   No current facility-administered medications for this encounter.    Patient has no known allergies.  REVIEW OF SYSTEMS: All systems negative except as listed in HPI, PMH and Problem list.   LVAD INTERROGATION:  See nurse's note above.   I reviewed the LVAD parameters from today, and  compared the results to the patient's prior recorded data.  No programming changes were made.  The LVAD is functioning within specified parameters.  The patient performs LVAD self-test daily.  LVAD interrogation was negative for any  significant power changes, alarms or PI events/speed drops.  LVAD equipment check completed and is in good working order.  Back-up equipment present.   LVAD education done on emergency procedures and precautions and reviewed exit site care.    Vitals:   05/03/21 1334 05/03/21 1341  BP: (!) 80/0 98/76  Pulse: 98 98  Weight: 85.3 kg (188 lb) 85.3 kg (188 lb)  Height: $Remove'5\' 10"'JYfRcBW$  (1.778 m) $RemoveB'5\' 10"'skwxlwhn$  (1.778 m)  MAP 80 => 68 with standing  Physical Exam: General: Well appearing this am. NAD.  HEENT: Normal. Neck: Supple, JVP 7-8 cm. Carotids OK.  Cardiac:  Mechanical heart sounds with LVAD hum present.  Lungs:  CTAB, normal effort.  Abdomen:  NT, ND, no HSM. No bruits or masses. +BS  LVAD exit site: Well-healed and incorporated. Dressing dry and intact. No erythema or drainage. Stabilization device present and accurately applied. Driveline dressing changed daily per sterile technique. Extremities:  Warm and dry. No cyanosis, clubbing, rash, or edema.  Neuro:  Alert & oriented x 3. Cranial nerves grossly intact. Moves all 4 extremities w/o difficulty. Affect pleasant     ASSESSMENT AND PLAN: 1. Chronic systolic CHF: Echo 38/45 with EF 20-25%, mildly decreased RV function. LHC/RHC in 12/21 with patent grafts, low output. Suspect mixed ischemic/nonischemic cardiomyopathy (prior heavy ETOH and drugs as well as CAD). No ETOH, drugs, smoking since CABG in 12/20. Admitted with cardiogenic shock in 12/21, had placement of Impella 5.5 initially, now s/p Heartmate 3 LVAD on 11/17/20.  Doing well symptomatically, NYHA class II. Not volume overloaded on exam. No BRBPR/melena.  He is orthostatic today with higher creatinine and many PI events recently on device. Took Lasix 3 days in a row  recently.  Hgb stable.  - Do not use Lasix.   - Hold Entresto for a day then decrease to 49/51 bid.  - Continue dapagliflozin 5 mg daily.  - Continue Coumadin, INR 2-2.5.  2. CAD: S/p CABG 12/20. LHC pre-VAD with patent grafts, no target for intervention. - Continue statin. 3. Atrial fibrillation: Paroxysmal. S/p Maze and LA appendage clip with CABG in 12/20.  In NSR.  - He is still taking amiodarone, can stop today.  4. Type 2 diabetes: Close followup with PCP.   5. Prior ETOH, drugs: None x  >1 year. 6. Gout: Can use colchicine prn.   Loralie Champagne 05/03/2021

## 2021-05-04 ENCOUNTER — Telehealth (HOSPITAL_COMMUNITY): Payer: Self-pay | Admitting: *Deleted

## 2021-05-04 DIAGNOSIS — F32A Depression, unspecified: Secondary | ICD-10-CM

## 2021-05-04 DIAGNOSIS — Z95811 Presence of heart assist device: Secondary | ICD-10-CM

## 2021-05-04 MED ORDER — SERTRALINE HCL 50 MG PO TABS: 50.0000 mg | ORAL_TABLET | Freq: Every day | ORAL | 3 refills | Status: DC

## 2021-05-04 NOTE — Telephone Encounter (Signed)
Patient called VAD office and left message stating he would like to try something for his depression.  Notified Dr. Shirlee Latch patient is c/o depression and is having a difficult time adjusting to the equipment. Rx will be sent for sertraline. Reviewed drug interactions with Sharen Hones, PharmD with no issues identified.  Called patient to inform him Dr. Shirlee Latch will start anti-depressant for patient. Asked him to call if any questions, concerns, or problems. Pt verbalized agreement to same.   Hessie Diener RN, VAD Coordinator [phone]

## 2021-05-09 ENCOUNTER — Ambulatory Visit (HOSPITAL_COMMUNITY): Payer: Self-pay

## 2021-05-09 LAB — POCT INR: INR: 2.5 (ref 2.0–3.0)

## 2021-05-09 NOTE — Progress Notes (Signed)
LVAD INR 

## 2021-05-16 ENCOUNTER — Ambulatory Visit (HOSPITAL_COMMUNITY): Payer: Self-pay

## 2021-05-16 LAB — POCT INR: INR: 3.1 — AB (ref 2.0–3.0)

## 2021-05-16 NOTE — Progress Notes (Signed)
LVAD INR 

## 2021-05-22 ENCOUNTER — Ambulatory Visit (HOSPITAL_COMMUNITY): Payer: Self-pay

## 2021-05-22 DIAGNOSIS — Z7901 Long term (current) use of anticoagulants: Secondary | ICD-10-CM

## 2021-05-22 DIAGNOSIS — Z95811 Presence of heart assist device: Secondary | ICD-10-CM

## 2021-05-22 LAB — POCT INR: INR: 2.6 (ref 2.0–3.0)

## 2021-05-22 MED ORDER — WARFARIN SODIUM 4 MG PO TABS: ORAL_TABLET | ORAL | 11 refills | Status: DC

## 2021-05-22 NOTE — Progress Notes (Signed)
LVAD INR 

## 2021-05-27 ENCOUNTER — Other Ambulatory Visit (HOSPITAL_COMMUNITY): Payer: Self-pay | Admitting: Cardiology

## 2021-05-27 DIAGNOSIS — Z95811 Presence of heart assist device: Secondary | ICD-10-CM

## 2021-05-27 DIAGNOSIS — F32A Depression, unspecified: Secondary | ICD-10-CM

## 2021-05-31 ENCOUNTER — Other Ambulatory Visit (HOSPITAL_COMMUNITY): Payer: Self-pay | Admitting: *Deleted

## 2021-05-31 ENCOUNTER — Other Ambulatory Visit (HOSPITAL_COMMUNITY)
Admission: RE | Admit: 2021-05-31 | Discharge: 2021-05-31 | Disposition: A | Discharge status: Home / Self Care | Payer: 59 | Source: Ambulatory Visit | Attending: Cardiology | Admitting: Cardiology

## 2021-05-31 ENCOUNTER — Other Ambulatory Visit: Payer: Self-pay

## 2021-05-31 DIAGNOSIS — Z95811 Presence of heart assist device: Secondary | ICD-10-CM

## 2021-05-31 DIAGNOSIS — Z7901 Long term (current) use of anticoagulants: Secondary | ICD-10-CM

## 2021-05-31 LAB — PROTIME-INR
INR: 2.4 — ABNORMAL HIGH (ref 0.8–1.2)
Prothrombin Time: 25.9 seconds — ABNORMAL HIGH (ref 11.4–15.2)

## 2021-06-01 ENCOUNTER — Ambulatory Visit (HOSPITAL_COMMUNITY): Payer: Self-pay

## 2021-06-01 NOTE — Progress Notes (Signed)
LVAD INR 

## 2021-06-04 ENCOUNTER — Ambulatory Visit (HOSPITAL_COMMUNITY): Payer: Self-pay

## 2021-06-04 LAB — POCT INR: INR: 1.4 — AB (ref 2.0–3.0)

## 2021-06-04 NOTE — Progress Notes (Signed)
LVAD INR 

## 2021-06-13 ENCOUNTER — Ambulatory Visit (HOSPITAL_COMMUNITY): Payer: Self-pay

## 2021-06-13 LAB — POCT INR: INR: 2.4 (ref 2.0–3.0)

## 2021-06-13 NOTE — Progress Notes (Signed)
LVAD INR 

## 2021-06-14 ENCOUNTER — Other Ambulatory Visit (HOSPITAL_COMMUNITY): Payer: Self-pay | Admitting: Cardiology

## 2021-06-14 DIAGNOSIS — Z95811 Presence of heart assist device: Secondary | ICD-10-CM

## 2021-06-14 DIAGNOSIS — E119 Type 2 diabetes mellitus without complications: Secondary | ICD-10-CM

## 2021-06-20 ENCOUNTER — Ambulatory Visit (HOSPITAL_COMMUNITY): Payer: Self-pay

## 2021-06-20 LAB — POCT INR: INR: 1.5 — AB (ref 2.0–3.0)

## 2021-06-20 NOTE — Progress Notes (Signed)
LVAD INR 

## 2021-06-27 ENCOUNTER — Ambulatory Visit (HOSPITAL_COMMUNITY): Payer: Self-pay

## 2021-06-27 LAB — POCT INR: INR: 1.9 — AB (ref 2.0–3.0)

## 2021-06-27 NOTE — Progress Notes (Signed)
LVAD INR 

## 2021-07-02 ENCOUNTER — Other Ambulatory Visit (HOSPITAL_COMMUNITY): Payer: Self-pay | Admitting: Unknown Physician Specialty

## 2021-07-02 DIAGNOSIS — Z95811 Presence of heart assist device: Secondary | ICD-10-CM

## 2021-07-02 DIAGNOSIS — Z7901 Long term (current) use of anticoagulants: Secondary | ICD-10-CM

## 2021-07-03 ENCOUNTER — Encounter (HOSPITAL_COMMUNITY): Payer: 59

## 2021-07-04 ENCOUNTER — Ambulatory Visit (HOSPITAL_COMMUNITY): Payer: Self-pay

## 2021-07-04 LAB — POCT INR: INR: 1.4 — AB (ref 2.0–3.0)

## 2021-07-04 NOTE — Progress Notes (Signed)
LVAD INR 

## 2021-07-10 ENCOUNTER — Telehealth (HOSPITAL_COMMUNITY): Payer: Self-pay | Admitting: *Deleted

## 2021-07-10 ENCOUNTER — Ambulatory Visit (HOSPITAL_COMMUNITY): Payer: Self-pay | Admitting: Pharmacist

## 2021-07-10 ENCOUNTER — Encounter (HOSPITAL_COMMUNITY): Payer: 59

## 2021-07-10 DIAGNOSIS — Z7901 Long term (current) use of anticoagulants: Secondary | ICD-10-CM

## 2021-07-10 DIAGNOSIS — Z95811 Presence of heart assist device: Secondary | ICD-10-CM

## 2021-07-10 LAB — POCT INR: INR: 1.5 — AB (ref 2.0–3.0)

## 2021-07-10 MED ORDER — WARFARIN SODIUM 4 MG PO TABS: ORAL_TABLET | ORAL | 11 refills | Status: DC

## 2021-07-10 NOTE — Progress Notes (Signed)
LVAD INR 

## 2021-07-10 NOTE — Telephone Encounter (Signed)
Patient's wife [name]) called to cancel VAD Clinic appointment for today due to lack of transportation. This is the second clinic cancellation. She reported their car wasn't running well enough, and his mother's car was unavailable to borrow.  She reports he is "tired" all the time and has "already gone back to bed" this am. Stressed importance of Dr. Shirlee Latch seeing patient and evaluating this issue.   Appointment re-scheduled for Thursday, 07/12/21 at 11:00 am. [facility] referral called in and asked for appt time above to be scheduled. [facility] [employer] will contact pt/wife with details of services.  Called wife back and informed her of re-scheduled date, time, and transportation arrangements. I asked her if patient would come to this appt? She seemed reluctant to talk, and said "I can't say, he can hear me".  Encouraged her to explain to him of keeping clinic appts along with regular follow up. She agreed to same. She will call if any issues.   Hessie Diener RN, VAD Coordinator [phone]

## 2021-07-12 ENCOUNTER — Ambulatory Visit (HOSPITAL_COMMUNITY): Payer: Self-pay | Admitting: Pharmacist

## 2021-07-12 ENCOUNTER — Ambulatory Visit (HOSPITAL_COMMUNITY)
Admission: RE | Admit: 2021-07-12 | Discharge: 2021-07-12 | Disposition: A | Discharge status: Home / Self Care | Payer: 59 | Source: Ambulatory Visit | Attending: Cardiology | Admitting: Cardiology

## 2021-07-12 ENCOUNTER — Other Ambulatory Visit (HOSPITAL_COMMUNITY): Payer: Self-pay | Admitting: *Deleted

## 2021-07-12 ENCOUNTER — Other Ambulatory Visit: Payer: Self-pay

## 2021-07-12 VITALS — BP 86/69 | HR 97 | Ht 70.0 in | Wt 190.0 lb

## 2021-07-12 DIAGNOSIS — I493 Ventricular premature depolarization: Secondary | ICD-10-CM | POA: Insufficient documentation

## 2021-07-12 DIAGNOSIS — Z452 Encounter for adjustment and management of vascular access device: Secondary | ICD-10-CM | POA: Diagnosis not present

## 2021-07-12 DIAGNOSIS — Z7984 Long term (current) use of oral hypoglycemic drugs: Secondary | ICD-10-CM | POA: Diagnosis not present

## 2021-07-12 DIAGNOSIS — Z95811 Presence of heart assist device: Secondary | ICD-10-CM | POA: Insufficient documentation

## 2021-07-12 DIAGNOSIS — I5022 Chronic systolic (congestive) heart failure: Secondary | ICD-10-CM

## 2021-07-12 DIAGNOSIS — J449 Chronic obstructive pulmonary disease, unspecified: Secondary | ICD-10-CM | POA: Insufficient documentation

## 2021-07-12 DIAGNOSIS — I480 Paroxysmal atrial fibrillation: Secondary | ICD-10-CM | POA: Insufficient documentation

## 2021-07-12 DIAGNOSIS — Z7951 Long term (current) use of inhaled steroids: Secondary | ICD-10-CM | POA: Insufficient documentation

## 2021-07-12 DIAGNOSIS — M109 Gout, unspecified: Secondary | ICD-10-CM | POA: Diagnosis not present

## 2021-07-12 DIAGNOSIS — Z951 Presence of aortocoronary bypass graft: Secondary | ICD-10-CM | POA: Insufficient documentation

## 2021-07-12 DIAGNOSIS — I110 Hypertensive heart disease with heart failure: Secondary | ICD-10-CM | POA: Diagnosis present

## 2021-07-12 DIAGNOSIS — Z79899 Other long term (current) drug therapy: Secondary | ICD-10-CM | POA: Insufficient documentation

## 2021-07-12 DIAGNOSIS — I2510 Atherosclerotic heart disease of native coronary artery without angina pectoris: Secondary | ICD-10-CM | POA: Insufficient documentation

## 2021-07-12 DIAGNOSIS — Z7901 Long term (current) use of anticoagulants: Secondary | ICD-10-CM | POA: Insufficient documentation

## 2021-07-12 DIAGNOSIS — E119 Type 2 diabetes mellitus without complications: Secondary | ICD-10-CM | POA: Insufficient documentation

## 2021-07-12 LAB — PROTIME-INR
INR: 2 — ABNORMAL HIGH (ref 0.8–1.2)
Prothrombin Time: 22.6 seconds — ABNORMAL HIGH (ref 11.4–15.2)

## 2021-07-12 LAB — BASIC METABOLIC PANEL
Anion gap: 10 (ref 5–15)
BUN: 20 mg/dL (ref 6–20)
CO2: 23 mmol/L (ref 22–32)
Calcium: 9.9 mg/dL (ref 8.9–10.3)
Chloride: 103 mmol/L (ref 98–111)
Creatinine, Ser: 0.81 mg/dL (ref 0.61–1.24)
GFR, Estimated: >60 mL/min (ref >60)
Glucose, Bld: 223 mg/dL — ABNORMAL HIGH (ref 70–99)
Potassium: 4.2 mmol/L (ref 3.5–5.1)
Sodium: 136 mmol/L (ref 135–145)

## 2021-07-12 LAB — CBC
HCT: 43.9 % (ref 39.0–52.0)
Hemoglobin: 14.6 g/dL (ref 13.0–17.0)
MCH: 29.2 pg (ref 26.0–34.0)
MCHC: 33.3 g/dL (ref 30.0–36.0)
MCV: 87.8 fL (ref 80.0–100.0)
Platelets: 256 10*3/uL (ref 150–400)
RBC: 5 MIL/uL (ref 4.22–5.81)
RDW: 13.9 % (ref 11.5–15.5)
WBC: 7.8 10*3/uL (ref 4.0–10.5)
nRBC: 0 % (ref 0.0–0.2)

## 2021-07-12 LAB — LACTATE DEHYDROGENASE: LDH: 163 U/L (ref 98–192)

## 2021-07-12 MED ORDER — SACUBITRIL-VALSARTAN 24-26 MG PO TABS: 1.0000 | ORAL_TABLET | Freq: Two times a day (BID) | ORAL | 6 refills | Status: DC

## 2021-07-12 MED ORDER — ALBUTEROL SULFATE (2.5 MG/3ML) 0.083% IN NEBU: 2.5000 mg | INHALATION_SOLUTION | Freq: Four times a day (QID) | RESPIRATORY_TRACT | 6 refills | Status: DC | PRN

## 2021-07-12 MED ORDER — AMIODARONE HCL 200 MG PO TABS: 200.0000 mg | ORAL_TABLET | Freq: Every day | ORAL | 3 refills | Status: DC

## 2021-07-12 NOTE — Patient Instructions (Signed)
Restart Amiodarone 200 mg - Take 1 tablet twice a day for 10 days and then resume a dose of 1 tablet daily Decrease Entresto to 24-26 twice a day We will obtain a chest xray today Return to clinic in 2 weeks for RAMP echo and visit with DR. Mclean.

## 2021-07-12 NOTE — Progress Notes (Addendum)
Patient presents for 2 month follow up in [facility] today with his wife [name]. Reports no problems with VAD equipment or concerns with drive line.  Patient reports he has been feeling more tired and SOB the last week. Pt states that he has been doing his albuterol nebulizer more often. Denies smoking. Obtained chest xray today: 1. No evidence of acute cardiopulmonary disease. 2. Mild cardiomegaly. Prior CABG, left atrial appendage clip, and LVAD.  States that he does get dizzy from time to time and especially if he stands quickly. See orthostatic BPs below. Will decrease Entresto to 24-26  Pt has been showering and driveline looks good. Denies any falls.   EKG obtained today. Pt has a lot of ectopy, placed on the monitor in clinic today. Will restart Amiodarone per DR. Avanish Cerullo  Pt has 110+ PI events on his VAD today, we will perform a RAMP echo at his next visit to ensure pump optimization.   Vital Signs:  Doppler Pressure: 88  Automatc BP: 86/69(77) HR: 98 SPO2: 98  Orthostatics: Lying: 102/74 (83) Sitting: 91/59 (69) Standing: 100/75 (89)    Weight: 190 lb w/ eqt Last weight: 188 lb  Home weights: 175 - 180 lbs   VAD Indication: Destination Therapy. HM III implanted 11/17/20 by Dr Orvan Seen   VAD interrogation: Speed: 5500 Flow: 4.6 Power: 4.2 w    PI: 2.8 Hct: 44  Alarms: none  Events: 110+ today  Fixed speed: 5500 Low speed limit: 5200  Primary controller back up battery due for replacement in 20 months. Back up controller back up battery due for change in 20 months.   I reviewed the LVAD parameters from today and compared the results to the patient's prior recorded data. LVAD interrogation was NEGATIVE for significant power changes, NEGATIVE for clinical alarms and STABLE for PI events/speed drops. No programming changes were made and pump is functioning within specified parameters. Pt is performing daily controller and system monitor self tests along with  completing weekly and monthly maintenance for LVAD equipment.   LVAD equipment check completed and is in good working order. Back-up equipment present. Charged back up battery and performed self-test on equipment.    Annual Equipment Maintenance on UBC/PM was performed on 11/30/2020.   Exit Site Care: Drive line is being maintained weekly by patient's wife [name]. Drive line anchor secure. Pt denies fever or chills. Provided with 8 weekly kits and 5 anchors.   BP & Labs:  Doppler 88 - Doppler is reflecting Modified systolic   Hgb 45.6 - No S/S of bleeding. Specifically denies melena/BRBPR or nosebleeds. Occasional blood when wiping.    LDH 163 -  with established baseline of 190 - 360. Denies tea-colored urine. No power elevations noted on interrogation.    Plan: Restart Amiodarone 200 mg - Take 1 tablet twice a day for 10 days and then resume a dose of 1 tablet daily Decrease Entresto to 24-26 twice a day We will obtain a chest xray today Return to clinic in 2 weeks for RAMP echo and visit with DR. Brei Pociask.  [name] RN, BSN VAD Coordinator 24/7 Pager [phone]   Follow up for Heart Failure/LVAD:  60 y.o. 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 type 2 diabetes mellitus, COPD, and hypertension. Now s/p Heartmate 3 LVAD.   Patient had echo in 10/21 with EF 20-25%.  He was admitted in 12/21 with cardiogenic shock.  Cath showed patent grafts and low cardiac output.  Suspect mixed ischemic/nonischemic cardiomyopathy, prior heavy ETOH though no ETOH or smoking since 12/20 CABG.  He required stabilization with Impella 5.5 and ultimately had placement of Heartmate 3 LVAD.  Paroxysmal atrial fibrillation noted post-op, but generally stable course and discharged home.    Patient returns for LVAD followup.  He has had  very frequent PI events with some suction recently.  He says that he is trying to stay hydrated.  He does not take a diuretic.  MAP 77, he occasionally gets lightheaded with standing.  No dyspnea walking inside and in cool air, he does get short of breath walking around in the extreme heat.  No orthopnea/PND.  Telemetry shows frequent PVCs and PACs.  No BRBPR/melena.   CXR was done today, no acute abnormalities.   Denies LVAD alarms.  Reports taking Coumadin as prescribed and adherence to anticoagulation based dietary restrictions.  Denies bright red blood per rectum or melena, no dark urine or hematuria.    ECG (personally reviewed): NSR, PVCs, PACs  Labs (12/21): K 4.1, creatinine 0.73, INR 2.4, hgb 12.3, LDH 194, LFTs normal, TSH normal Labs (1/22): hgb 14.4, creatinine 0.91, LDH 197 Labs (5/22): K 3.8, creatinine 1.44, hgb 15, LDH 177   Past Medical History:  Diagnosis Date   Arthritis    CAD (coronary artery disease)    a. s/p CABG in 11/2019 with LIMA-LAD, SVG-OM1, SVG-PDA and SVG-D1   CHF (congestive heart failure) (HCC)    a. EF < 20% by echo in 11/2019   COPD (chronic obstructive pulmonary disease) (HCC)    Essential hypertension    PAF (paroxysmal atrial fibrillation) (HCC)    Type 2 diabetes mellitus (HCC)     Current Outpatient Medications  Medication Sig Dispense Refill   amiodarone (PACERONE) 200 MG tablet Take 1 tablet (200 mg total) by mouth daily. Take 1 pill twice daily for 10 days and then resume a dose of 1 pill daily 90 tablet 3   blood glucose meter kit and supplies KIT Dispense based on patient and insurance preference. Use to check CBG's three times a day. (FOR ICD-9 250.00, 250.01). 1 each 0   COMBIVENT RESPIMAT 20-100 MCG/ACT AERS respimat Inhale 1 puff into the lungs every 6 (six) hours as needed. 4 g 1   dapagliflozin propanediol (FARXIGA) 5 MG TABS tablet Take 1 tablet (5 mg total) by mouth daily before breakfast. (Patient taking differently: Take 10 mg by  mouth daily before breakfast.) 30 tablet 3   enoxaparin (LOVENOX) 40 MG/0.4ML injection INJECT 0.4 MLS (40 MG TOTAL) INTO THE SKIN EVERY 12 (TWELVE) HOURS. 10 mL 2   gabapentin (NEURONTIN) 600 MG tablet Take 1 tablet (600 mg total) by mouth 3 (three) times daily. 90 tablet 0   metFORMIN (GLUCOPHAGE) 1000 MG tablet TAKE 1 TABLET (1,000 MG TOTAL) BY MOUTH IN THE MORNING AND AT BEDTIME. 180 tablet 3   mometasone-formoterol (DULERA) 200-5 MCG/ACT AERO Inhale 2 puffs into the lungs in the morning and at bedtime. 1 each 2   Multiple Vitamin (MULTIVITAMIN WITH MINERALS) TABS tablet Take 1 tablet by mouth daily.     OLANZapine (ZYPREXA) 5 MG tablet Take 5 mg by mouth at bedtime.     rosuvastatin (CRESTOR) 40 MG tablet Take 1 tablet (40 mg total) by mouth daily. 30 tablet 3   sacubitril-valsartan (ENTRESTO) 24-26 MG Take 1 tablet by mouth 2 (two) times  daily. 60 tablet 6   sertraline (ZOLOFT) 50 MG tablet TAKE 1 TABLET BY MOUTH EVERY DAY 90 tablet 3   Tetrahydrozoline HCl (REDNESS RELIEVER EYE DROPS OP) Apply 2 drops to eye daily as needed.     traZODone (DESYREL) 150 MG tablet TAKE 1 TABLET BY MOUTH AT BEDTIME. 90 tablet 3   warfarin (COUMADIN) 4 MG tablet Take 6 mg (1.5 tabs) on Mon/Wed/Fri and 4 mg (1 tab) all other days or as directed by HF Clinic 60 tablet 11   albuterol (PROVENTIL) (2.5 MG/3ML) 0.083% nebulizer solution Take 3 mLs (2.5 mg total) by nebulization every 6 (six) hours as needed for wheezing or shortness of breath. 75 mL 6   colchicine 0.6 MG tablet Take 1 tablet (0.6 mg total) by mouth as needed. (Patient not taking: No sig reported) 60 tablet 3   cyclobenzaprine (FLEXERIL) 5 MG tablet Take 1-2 tablets (5-10 mg total) by mouth 2 (two) times daily as needed for muscle spasms. (Patient not taking: Reported on 07/12/2021) 45 tablet 3   pantoprazole (PROTONIX) 40 MG tablet Take 1 tablet (40 mg total) by mouth daily. (Patient not taking: No sig reported) 30 tablet 2   potassium chloride SA  (KLOR-CON) 20 MEQ tablet Take 2 tablets (40 mEq total) by mouth daily. (Patient not taking: No sig reported) 60 tablet 3   No current facility-administered medications for this encounter.    Patient has no known allergies.  REVIEW OF SYSTEMS: All systems negative except as listed in HPI, PMH and Problem list.   LVAD INTERROGATION:  See nurse's note above.   I reviewed the LVAD parameters from today, and compared the results to the patient's prior recorded data.  No programming changes were made.  The LVAD is functioning within specified parameters.  The patient performs LVAD self-test daily.  LVAD interrogation was negative for any significant power changes, alarms or PI events/speed drops.  LVAD equipment check completed and is in good working order.  Back-up equipment present.   LVAD education done on emergency procedures and precautions and reviewed exit site care.    Vitals:   07/12/21 1128 07/12/21 1132  BP: (!) 88/0 (!) 86/69  Pulse: 97   SpO2: 98%   Weight: 86.2 kg (190 lb)   Height: 5' 10" (1.778 m)   MAP 77   Physical Exam: General: Well appearing this am. NAD.  HEENT: Normal. Neck: Supple, JVP 7-8 cm. Carotids OK.  Cardiac:  Mechanical heart sounds with LVAD hum present.  Lungs:  CTAB, normal effort.  Abdomen:  NT, ND, no HSM. No bruits or masses. +BS  LVAD exit site: Well-healed and incorporated. Dressing dry and intact. No erythema or drainage. Stabilization device present and accurately applied. Driveline dressing changed daily per sterile technique. Extremities:  Warm and dry. No cyanosis, clubbing, rash, or edema.  Neuro:  Alert & oriented x 3. Cranial nerves grossly intact. Moves all 4 extremities w/o difficulty. Affect pleasant    ASSESSMENT AND PLAN: 1. Chronic systolic CHF: Echo 34/28 with EF 20-25%, mildly decreased RV function. LHC/RHC in 12/21 with patent grafts, low output. Suspect mixed ischemic/nonischemic cardiomyopathy (prior heavy ETOH and drugs as  well as CAD).  No ETOH, drugs, smoking since CABG in 12/20. Admitted with cardiogenic shock in 12/21, had placement of Impella 5.5 initially, now s/p Heartmate 3 LVAD on 11/17/20.  NYHA class II-III with frequent PI events/suction and some orthostatic symptoms. Not volume overloaded on exam. No BRBPR/melena.   - Do not use  Lasix.   - Decrease Entresto again to 24/26 bid.   - Continue dapagliflozin 10 mg daily.  - Continue Coumadin, INR 2-2.5.  - Ramp echo at next appointment in 2 wks.  2. CAD: S/p CABG 12/20.  LHC pre-VAD with patent grafts, no target for intervention. - Continue statin. 3. Atrial fibrillation: Paroxysmal.  S/p Maze and LA appendage clip with CABG in 12/20.  In NSR.  4. Type 2 diabetes: Close followup with PCP.   5. Prior ETOH, drugs: None x  >1 year.  6. Gout: Can use colchicine prn.  7. PACs/PVCs: Very frequent on telemetry hookup today.  I wonder if these are contributing to his symptoms.  - I will have him restart amiodarone 200 mg bid x 10 days then 200 mg daily after that.   Loralie Champagne 07/12/2021

## 2021-07-12 NOTE — Progress Notes (Signed)
LVAD INR 

## 2021-07-16 ENCOUNTER — Ambulatory Visit (HOSPITAL_COMMUNITY): Payer: Self-pay | Admitting: Pharmacist

## 2021-07-16 LAB — POCT INR: INR: 4.3 — AB (ref 2.0–3.0)

## 2021-07-16 NOTE — Progress Notes (Signed)
LVAD INR 

## 2021-07-19 LAB — DRUG SCREEN 10 W/CONF, SERUM
Amphetamines, IA: NEGATIVE ng/mL
Barbiturates, IA: NEGATIVE ug/mL
Benzodiazepines, IA: NEGATIVE ng/mL
Cocaine & Metabolite, IA: NEGATIVE ng/mL
Methadone, IA: NEGATIVE ng/mL
Opiates, IA: NEGATIVE ng/mL
Oxycodones, IA: NEGATIVE ng/mL
Phencyclidine, IA: NEGATIVE ng/mL
Propoxyphene, IA: NEGATIVE ng/mL
THC(Marijuana) Metabolite, IA: NEGATIVE ng/mL

## 2021-07-24 ENCOUNTER — Ambulatory Visit (HOSPITAL_COMMUNITY): Payer: Self-pay | Admitting: Pharmacist

## 2021-07-24 LAB — POCT INR: INR: 1.5 — AB (ref 2.0–3.0)

## 2021-07-24 NOTE — Progress Notes (Signed)
LVAD INR 

## 2021-07-25 ENCOUNTER — Other Ambulatory Visit (HOSPITAL_COMMUNITY): Payer: Self-pay | Admitting: *Deleted

## 2021-07-25 DIAGNOSIS — Z7901 Long term (current) use of anticoagulants: Secondary | ICD-10-CM

## 2021-07-25 DIAGNOSIS — Z95811 Presence of heart assist device: Secondary | ICD-10-CM

## 2021-07-25 DIAGNOSIS — I5022 Chronic systolic (congestive) heart failure: Secondary | ICD-10-CM

## 2021-07-26 ENCOUNTER — Ambulatory Visit (HOSPITAL_COMMUNITY): Payer: Self-pay | Admitting: Pharmacist

## 2021-07-26 ENCOUNTER — Ambulatory Visit (HOSPITAL_BASED_OUTPATIENT_CLINIC_OR_DEPARTMENT_OTHER)
Admission: RE | Admit: 2021-07-26 | Discharge: 2021-07-26 | Disposition: A | Discharge status: Home / Self Care | Payer: 59 | Source: Ambulatory Visit | Attending: Internal Medicine | Admitting: Internal Medicine

## 2021-07-26 ENCOUNTER — Encounter (HOSPITAL_COMMUNITY): Payer: 59

## 2021-07-26 ENCOUNTER — Ambulatory Visit (HOSPITAL_COMMUNITY)
Admission: RE | Admit: 2021-07-26 | Discharge: 2021-07-26 | Disposition: A | Discharge status: Home / Self Care | Payer: 59 | Source: Ambulatory Visit | Attending: Cardiology | Admitting: Cardiology

## 2021-07-26 ENCOUNTER — Other Ambulatory Visit: Payer: Self-pay

## 2021-07-26 VITALS — BP 92/0 | HR 95 | Ht 70.0 in | Wt 191.6 lb

## 2021-07-26 DIAGNOSIS — J449 Chronic obstructive pulmonary disease, unspecified: Secondary | ICD-10-CM | POA: Diagnosis not present

## 2021-07-26 DIAGNOSIS — Z7984 Long term (current) use of oral hypoglycemic drugs: Secondary | ICD-10-CM | POA: Diagnosis not present

## 2021-07-26 DIAGNOSIS — I2510 Atherosclerotic heart disease of native coronary artery without angina pectoris: Secondary | ICD-10-CM | POA: Diagnosis not present

## 2021-07-26 DIAGNOSIS — I5022 Chronic systolic (congestive) heart failure: Secondary | ICD-10-CM

## 2021-07-26 DIAGNOSIS — Z7901 Long term (current) use of anticoagulants: Secondary | ICD-10-CM | POA: Diagnosis present

## 2021-07-26 DIAGNOSIS — E785 Hyperlipidemia, unspecified: Secondary | ICD-10-CM | POA: Insufficient documentation

## 2021-07-26 DIAGNOSIS — I480 Paroxysmal atrial fibrillation: Secondary | ICD-10-CM | POA: Diagnosis not present

## 2021-07-26 DIAGNOSIS — G4730 Sleep apnea, unspecified: Secondary | ICD-10-CM | POA: Diagnosis not present

## 2021-07-26 DIAGNOSIS — Z95811 Presence of heart assist device: Secondary | ICD-10-CM

## 2021-07-26 DIAGNOSIS — E119 Type 2 diabetes mellitus without complications: Secondary | ICD-10-CM | POA: Insufficient documentation

## 2021-07-26 DIAGNOSIS — Z87891 Personal history of nicotine dependence: Secondary | ICD-10-CM | POA: Diagnosis not present

## 2021-07-26 DIAGNOSIS — Z79899 Other long term (current) drug therapy: Secondary | ICD-10-CM | POA: Diagnosis not present

## 2021-07-26 DIAGNOSIS — Z951 Presence of aortocoronary bypass graft: Secondary | ICD-10-CM | POA: Insufficient documentation

## 2021-07-26 DIAGNOSIS — I110 Hypertensive heart disease with heart failure: Secondary | ICD-10-CM | POA: Diagnosis not present

## 2021-07-26 LAB — CBC
HCT: 44.7 % (ref 39.0–52.0)
Hemoglobin: 14.7 g/dL (ref 13.0–17.0)
MCH: 29.3 pg (ref 26.0–34.0)
MCHC: 32.9 g/dL (ref 30.0–36.0)
MCV: 89 fL (ref 80.0–100.0)
Platelets: 217 10*3/uL (ref 150–400)
RBC: 5.02 MIL/uL (ref 4.22–5.81)
RDW: 13.9 % (ref 11.5–15.5)
WBC: 8.7 10*3/uL (ref 4.0–10.5)
nRBC: 0 % (ref 0.0–0.2)

## 2021-07-26 LAB — T4, FREE: Free T4: 1.05 ng/dL (ref 0.61–1.12)

## 2021-07-26 LAB — LACTATE DEHYDROGENASE: LDH: 147 U/L (ref 98–192)

## 2021-07-26 LAB — PROTIME-INR
INR: 1.1 (ref 0.8–1.2)
Prothrombin Time: 14.2 seconds (ref 11.4–15.2)

## 2021-07-26 LAB — COMPREHENSIVE METABOLIC PANEL
ALT: 22 U/L (ref 0–44)
AST: 20 U/L (ref 15–41)
Albumin: 3.6 g/dL (ref 3.5–5.0)
Alkaline Phosphatase: 91 U/L (ref 38–126)
Anion gap: 9 (ref 5–15)
BUN: 28 mg/dL — ABNORMAL HIGH (ref 6–20)
CO2: 26 mmol/L (ref 22–32)
Calcium: 9 mg/dL (ref 8.9–10.3)
Chloride: 102 mmol/L (ref 98–111)
Creatinine, Ser: 0.94 mg/dL (ref 0.61–1.24)
GFR, Estimated: >60 mL/min (ref >60)
Glucose, Bld: 147 mg/dL — ABNORMAL HIGH (ref 70–99)
Potassium: 4.4 mmol/L (ref 3.5–5.1)
Sodium: 137 mmol/L (ref 135–145)
Total Bilirubin: 0.4 mg/dL (ref 0.3–1.2)
Total Protein: 6.9 g/dL (ref 6.5–8.1)

## 2021-07-26 LAB — TSH: TSH: 0.66 u[IU]/mL (ref 0.350–4.500)

## 2021-07-26 NOTE — Progress Notes (Addendum)
Patient presents for 2 week follow up in [facility] today with his wife Kevin Underwood. Reports no problems with VAD equipment or concerns with drive line.  Patient reports he has been having a lot of fatigue over the past few days and increased cough for the last 4 days. Pt took a covid test last night which was negative. Wife confirms that she restarted Amiodarone at his last visit. Pt has much less ectopy today.  We decreased Entresto at his last visit due to low BP, pts BP improved today as well as PI events and pt denies any dizziness or lightheadness when standing or changing postions.  Dr Aundra Dubin would like for the pt to start CR. We have referred pt to AP CR before and he no showed several times. Pt is agreeing to participate in CR this time. I have sent the referral in epic.  Vital Signs:  Doppler Pressure: 92  Automatc BP: 110/69 (88) HR: 95 SPO2: 96    Weight: 191.6 lb w/ eqt Last weight: 190.4 lb  Home weights: 175 - 180 lbs    VAD Indication: Destination Therapy. HM III implanted 11/17/20 by Dr Orvan Seen   VAD interrogation: Speed: 5500 Flow: 4.1 Power: 4.1 w    PI: 4 Hct: 44  Alarms: 1 no external power this morning, pt accidentally double disconnected  Events: 30 today; 60 yesterday  Fixed speed: 5500 Low speed limit: 5200  Primary controller back up battery due for replacement in 20 months. Back up controller back up battery due for change in 20 months.   I reviewed the LVAD parameters from today and compared the results to the patient's prior recorded data. LVAD interrogation was NEGATIVE for significant power changes, NEGATIVE for clinical alarms and STABLE for PI events/speed drops. No programming changes were made and pump is functioning within specified parameters. Pt is performing daily controller and system monitor self tests along with completing weekly and monthly maintenance for LVAD equipment.   LVAD equipment check completed and is in good working order.  Back-up equipment present. Charged back up battery and performed self-test on equipment.    Annual Equipment Maintenance on UBC/PM was performed on 11/30/2020.   Exit Site Care: Drive line is being maintained weekly by patient's wife Kevin Underwood. Pt has sufficient kits at home.  BP & Labs:  Doppler 92 - Doppler is reflecting MAP   Hgb 14.7 - No S/S of bleeding. Specifically denies melena/BRBPR or nosebleeds. Occasional blood when wiping.    LDH 147 -  with established baseline of 190 - 360. Denies tea-colored urine. No power elevations noted on interrogation.      Plan: Restart Protonix 40 mg daily Start Cardiac Re[facility] at [facility] Return to clinic in 1 month   [name] RN, BSN VAD Coordinator 24/7 Pager [phone]  Follow up for Heart Failure/LVAD:  60 y.o. 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 type 2 diabetes mellitus, COPD, and hypertension. Now s/p Heartmate 3 LVAD.   Patient had echo in 10/21 with EF 20-25%.  He was admitted in 12/21 with cardiogenic shock.  Cath showed patent grafts and low cardiac output.  Suspect mixed ischemic/nonischemic cardiomyopathy, prior heavy ETOH though no ETOH or smoking since 12/20 CABG.  He required stabilization with Impella 5.5 and ultimately had placement of Heartmate 3 LVAD.  Paroxysmal atrial fibrillation noted post-op, but  generally stable course and discharged home.    Ramp echo done today, speed increased from 5500 rpm to 5600 rpm.   Patient returns for LVAD followup.  At last appointment, he had very frequent PI events with some suction associated with lightheadedness.  Entresto was cut back, lightheadedness has resolved.  MAP stable at 88.  Fewer PI events and no suction.  Additionally, he was having very frequent symptomatic PVCs.  We restarted amiodarone and these  have improved significantly (followed on telemetry while in the office today).  No significant dyspnea but continues to have a chronic cough.  He is not very active.   Denies LVAD alarms.  Reports taking Coumadin as prescribed and adherence to anticoagulation based dietary restrictions.  Denies bright red blood per rectum or melena, no dark urine or hematuria.    ECG (personally reviewed): NSR, IVCD  Labs (12/21): K 4.1, creatinine 0.73, INR 2.4, hgb 12.3, LDH 194, LFTs normal, TSH normal Labs (1/22): hgb 14.4, creatinine 0.91, LDH 197 Labs (5/22): K 3.8, creatinine 1.44, hgb 15, LDH 177   Past Medical History:  Diagnosis Date   Arthritis    CAD (coronary artery disease)    a. s/p CABG in 11/2019 with LIMA-LAD, SVG-OM1, SVG-PDA and SVG-D1   CHF (congestive heart failure) (HCC)    a. EF < 20% by echo in 11/2019   COPD (chronic obstructive pulmonary disease) (HCC)    Essential hypertension    PAF (paroxysmal atrial fibrillation) (HCC)    Type 2 diabetes mellitus (HCC)     Current Outpatient Medications  Medication Sig Dispense Refill   albuterol (PROVENTIL) (2.5 MG/3ML) 0.083% nebulizer solution Take 3 mLs (2.5 mg total) by nebulization every 6 (six) hours as needed for wheezing or shortness of breath. 75 mL 6   amiodarone (PACERONE) 200 MG tablet Take 1 tablet (200 mg total) by mouth daily. Take 1 pill twice daily for 10 days and then resume a dose of 1 pill daily 90 tablet 3   blood glucose meter kit and supplies KIT Dispense based on patient and insurance preference. Use to check CBG's three times a day. (FOR ICD-9 250.00, 250.01). 1 each 0   colchicine 0.6 MG tablet Take 1 tablet (0.6 mg total) by mouth as needed. (Patient not taking: No sig reported) 60 tablet 3   COMBIVENT RESPIMAT 20-100 MCG/ACT AERS respimat Inhale 1 puff into the lungs every 6 (six) hours as needed. 4 g 1   cyclobenzaprine (FLEXERIL) 5 MG tablet Take 1-2 tablets (5-10 mg total) by mouth 2 (two) times daily as  needed for muscle spasms. (Patient not taking: Reported on 07/12/2021) 45 tablet 3   dapagliflozin propanediol (FARXIGA) 5 MG TABS tablet Take 1 tablet (5 mg total) by mouth daily before breakfast. (Patient taking differently: Take 10 mg by mouth daily before breakfast.) 30 tablet 3   gabapentin (NEURONTIN) 600 MG tablet Take 1 tablet (600 mg total) by mouth 3 (three) times daily. 90 tablet 0   metFORMIN (GLUCOPHAGE) 1000 MG tablet TAKE 1 TABLET (1,000 MG TOTAL) BY MOUTH IN THE MORNING AND AT BEDTIME. 180 tablet 3   mometasone-formoterol (DULERA) 200-5 MCG/ACT AERO Inhale 2 puffs into the lungs in the morning and at bedtime. 1 each 2   Multiple Vitamin (MULTIVITAMIN WITH MINERALS) TABS tablet Take 1 tablet by mouth daily.     OLANZapine (ZYPREXA) 5 MG tablet Take 5 mg by mouth at bedtime.     pantoprazole (PROTONIX) 40 MG tablet Take 1 tablet (  40 mg total) by mouth daily. (Patient not taking: No sig reported) 30 tablet 2   potassium chloride SA (KLOR-CON) 20 MEQ tablet Take 2 tablets (40 mEq total) by mouth daily. (Patient not taking: No sig reported) 60 tablet 3   rosuvastatin (CRESTOR) 40 MG tablet Take 1 tablet (40 mg total) by mouth daily. 30 tablet 3   sacubitril-valsartan (ENTRESTO) 24-26 MG Take 1 tablet by mouth 2 (two) times daily. 60 tablet 6   sertraline (ZOLOFT) 50 MG tablet TAKE 1 TABLET BY MOUTH EVERY DAY 90 tablet 3   Tetrahydrozoline HCl (REDNESS RELIEVER EYE DROPS OP) Apply 2 drops to eye daily as needed.     traZODone (DESYREL) 150 MG tablet TAKE 1 TABLET BY MOUTH AT BEDTIME. 90 tablet 3   warfarin (COUMADIN) 4 MG tablet Take 6 mg (1.5 tabs) on Mon/Wed/Fri and 4 mg (1 tab) all other days or as directed by HF Clinic 60 tablet 11   No current facility-administered medications for this encounter.    Patient has no known allergies.  REVIEW OF SYSTEMS: All systems negative except as listed in HPI, PMH and Problem list.   LVAD INTERROGATION:  See nurse's note above.   I reviewed  the LVAD parameters from today, and compared the results to the patient's prior recorded data.  No programming changes were made.  The LVAD is functioning within specified parameters.  The patient performs LVAD self-test daily.  LVAD interrogation was negative for any significant power changes, alarms or PI events/speed drops.  LVAD equipment check completed and is in good working order.  Back-up equipment present.   LVAD education done on emergency procedures and precautions and reviewed exit site care.    Vitals:   07/26/21 1048 07/26/21 1049  BP: 110/69 (!) 92/0  Pulse: 95   SpO2: 96%   Weight: 86.9 kg (191 lb 9.6 oz)   Height: $Remove'5\' 10"'RzmowTl$  (1.778 m)   MAP 88  Physical Exam: General: Well appearing this am. NAD.  HEENT: Normal. Neck: Supple, JVP 7-8 cm. Carotids OK.  Cardiac:  Mechanical heart sounds with LVAD hum present.  Lungs:  CTAB, normal effort.  Abdomen:  NT, ND, no HSM. No bruits or masses. +BS  LVAD exit site: Well-healed and incorporated. Dressing dry and intact. No erythema or drainage. Stabilization device present and accurately applied. Driveline dressing changed daily per sterile technique. Extremities:  Warm and dry. No cyanosis, clubbing, rash, or edema.  Neuro:  Alert & oriented x 3. Cranial nerves grossly intact. Moves all 4 extremities w/o difficulty. Affect pleasant    ASSESSMENT AND PLAN: 1. Chronic systolic CHF: Echo 62/83 with EF 20-25%, mildly decreased RV function. LHC/RHC in 12/21 with patent grafts, low output. Suspect mixed ischemic/nonischemic cardiomyopathy (prior heavy ETOH and drugs as well as CAD).  No ETOH, drugs, smoking since CABG in 12/20. Admitted with cardiogenic shock in 12/21, had placement of Impella 5.5 initially, now s/p Heartmate 3 LVAD on 11/17/20.  NYHA class II.  Not volume overloaded on exam. Speed increased to 5600 rpm on ramp echo today.  - Continue Entresto 24/26 bid.   - Continue dapagliflozin 10 mg daily.  - Continue Coumadin, INR goal  2-2.5.  2. CAD: S/p CABG 12/20.  LHC pre-VAD with patent grafts, no target for intervention. - Continue statin. 3. Atrial fibrillation: Paroxysmal.  S/p Maze and LA appendage clip with CABG in 12/20.  In NSR today.  4. Type 2 diabetes: Close followup with PCP.   5. Prior ETOH,  drugs: None x  >1 year.  6. Gout: Can use colchicine prn.  7. PACs/PVCs: Appear to be resolved now that he is back on amiodarone.  - Follow LFT and TSH with amiodarone, will also need regular eye exam.  8. Cough: Chronic.  Possible chronic bronchitis given prior smoking.  CXR after last appt unremarkable.  - Trial of PPI, use Protonix 40 mg daily x 2 wks, can continue if it helps cough.   I would like to see him get more active, refer to cardiac rehab.    Loralie Champagne 07/26/2021

## 2021-07-26 NOTE — Progress Notes (Signed)
LVAD INR 

## 2021-07-26 NOTE — Progress Notes (Signed)
Speed  Flow  PI  Power  LVIDD  AI  Aortic opening MR  TR  Septum  RV  VTI (>18cm)  5500  4.1 4.0 4.1 4.0 none 5/5 none  none Pulling slightly to Right  nl   5600  4.3 3.2 4.4 4.2 none 5/5 None  none midline nl    5700 4.5 2.7 4.5 3.6 none 5/5 none none Pulling in  nl 5.7                                             Doppler MAP: 92 Auto cuff BP: 110/69 (88)   Ramp ECHO performed at bedside per Dr. Shirlee Latch  At completion of ramp study, patients primary controller programmed:  Fixed speed: 5600 Low speed limit: 5300   Carlton Adam RN, VAD Coordinator 24/7 pager [phone]

## 2021-07-26 NOTE — Patient Instructions (Signed)
Restart Protonix 40 mg daily Start Cardiac Rehab at [facility] Return to clinic in 1 month

## 2021-07-31 ENCOUNTER — Ambulatory Visit (HOSPITAL_COMMUNITY): Payer: Self-pay | Admitting: Pharmacist

## 2021-07-31 LAB — POCT INR: INR: 3.7 — AB (ref 2.0–3.0)

## 2021-07-31 NOTE — Progress Notes (Signed)
LVAD INR 

## 2021-08-01 ENCOUNTER — Other Ambulatory Visit (HOSPITAL_COMMUNITY): Payer: Self-pay | Admitting: *Deleted

## 2021-08-01 DIAGNOSIS — Z95811 Presence of heart assist device: Secondary | ICD-10-CM

## 2021-08-01 MED ORDER — PANTOPRAZOLE SODIUM 40 MG PO TBEC: 40.0000 mg | DELAYED_RELEASE_TABLET | Freq: Every day | ORAL | 6 refills | Status: DC

## 2021-08-07 ENCOUNTER — Ambulatory Visit (HOSPITAL_COMMUNITY): Payer: Self-pay | Admitting: Pharmacist

## 2021-08-07 ENCOUNTER — Other Ambulatory Visit (HOSPITAL_COMMUNITY): Payer: Self-pay | Admitting: *Deleted

## 2021-08-07 DIAGNOSIS — Z95811 Presence of heart assist device: Secondary | ICD-10-CM

## 2021-08-07 LAB — POCT INR: INR: 2.1 (ref 2.0–3.0)

## 2021-08-07 MED ORDER — ALBUTEROL SULFATE (2.5 MG/3ML) 0.083% IN NEBU: 2.5000 mg | INHALATION_SOLUTION | Freq: Four times a day (QID) | RESPIRATORY_TRACT | 6 refills | Status: DC | PRN

## 2021-08-07 NOTE — Progress Notes (Signed)
LVAD INR 

## 2021-08-14 LAB — POCT INR: INR: 2.9 (ref 2.0–3.0)

## 2021-08-16 ENCOUNTER — Telehealth (HOSPITAL_COMMUNITY): Payer: Self-pay | Admitting: *Deleted

## 2021-08-16 ENCOUNTER — Ambulatory Visit (HOSPITAL_COMMUNITY): Payer: Self-pay | Admitting: Pharmacist

## 2021-08-16 DIAGNOSIS — T827XXA Infection and inflammatory reaction due to other cardiac and vascular devices, implants and grafts, initial encounter: Secondary | ICD-10-CM

## 2021-08-16 MED ORDER — DOXYCYCLINE HYCLATE 100 MG PO CAPS: 100.0000 mg | ORAL_CAPSULE | Freq: Two times a day (BID) | ORAL | 0 refills | Status: DC

## 2021-08-16 NOTE — Progress Notes (Signed)
LVAD INR 

## 2021-08-16 NOTE — Telephone Encounter (Signed)
Wife called VAD clinic to report while changing patient's DL dressing she noticed the site was red with small amount bloody drainage under DL. Pt c/o tenderness around exit site. She reports patient has been "slinging his equipment and cords" stating he wishes he "never got this LVAD". She says he is showering and "throwing equipment around". She denies any drainage, foul odor from site and pt has had no fever or chills.  Dr. Aundra Dubin updated. Rx for Doxy 100 bid x 7 days sent to local pharmacy. Instructed wife to go back to gauze dressing kit and change every other day. If drainage, fever, foul odor, increased redness, or fever/chills should develop, she needs to call VAD pager. No showers for now. She verbalized understanding of same.  Audry Riles, PharmD updated about starting Doxy.  Zada Girt RN, [place] Coordinator [phone]

## 2021-08-20 ENCOUNTER — Ambulatory Visit (HOSPITAL_COMMUNITY): Payer: Self-pay | Admitting: Pharmacist

## 2021-08-20 LAB — POCT INR: INR: 2 (ref 2.0–3.0)

## 2021-08-20 NOTE — Progress Notes (Signed)
LVAD INR 

## 2021-08-24 ENCOUNTER — Other Ambulatory Visit (HOSPITAL_COMMUNITY): Payer: Self-pay | Admitting: *Deleted

## 2021-08-24 DIAGNOSIS — T827XXA Infection and inflammatory reaction due to other cardiac and vascular devices, implants and grafts, initial encounter: Secondary | ICD-10-CM

## 2021-08-24 DIAGNOSIS — Z7901 Long term (current) use of anticoagulants: Secondary | ICD-10-CM

## 2021-08-24 DIAGNOSIS — Z95811 Presence of heart assist device: Secondary | ICD-10-CM

## 2021-08-27 ENCOUNTER — Telehealth (HOSPITAL_COMMUNITY): Payer: Self-pay | Admitting: *Deleted

## 2021-08-27 ENCOUNTER — Encounter (HOSPITAL_COMMUNITY): Payer: 59

## 2021-08-27 NOTE — Telephone Encounter (Signed)
Wife called VAD office to cancel patient's VAD clinic appt scheduled for later this am.   She reports patient refused to come in saying he is mad about having the VAD and having to wear equipment. She says he "knocks things over" and "can't get around" and if "things don't go exactly as he wants" he becomes very angry. She says he has been "cocky" over last 3 days.  She says he cut himself shaving this am (close to his nose) and "can't get it to stop bleeding". Advised he should come to clinic for evaluation per MD. She says she "can't talk him into coming". She will take him to local ED if bleeding continues.   Stressed importance of patient keeping/making VAD Clinic appt so he can discuss concerns with Dr. Shirlee Latch, she says he will not come and will not let her make another appt at this time.  They do now have his mother's car for transportation needs.   Will update Dr. Shirlee Latch and Lasandra Beech, LCSW re: above.   Hessie Diener RN, VAD Coordinator [phone]

## 2021-08-28 ENCOUNTER — Ambulatory Visit (HOSPITAL_COMMUNITY): Payer: Self-pay | Admitting: Pharmacist

## 2021-08-28 LAB — POCT INR: INR: 2.2 (ref 2.0–3.0)

## 2021-08-28 NOTE — Progress Notes (Signed)
LVAD INR 

## 2021-09-03 ENCOUNTER — Telehealth (HOSPITAL_COMMUNITY): Payer: Self-pay | Admitting: Licensed Clinical Social Worker

## 2021-09-03 NOTE — Progress Notes (Signed)
Heart and Vascular Care Navigation  09/03/2021  Kevin Underwood [birth year] [id number]  Reason for Referral: Consulted to evaluate why pt not coming to appts.   Engaged with patient by telephone for follow up visit for Heart and Vascular Care Coordination.                                                                                                   Assessment:    CSW informed that pt had canceled multiple appts and there were concerns that he wasn't coming to appts due to depression/lack of motivation.  CSW called pt to discuss.  Pt reports the only reason he hasn't been coming is monetary- that he couldn't afford to make it to his appts until the beginning of the month when he got paid.    CSW explained that he could utilize [employer] to get to and from medical appts which would take away the financial burden of coming to the clinic.  Pt believes he has used this service before and is agreeable to utilizing again.  CSW provided with number to make ride arrangements once he has made an appt with the clinic.                                   HRT/VAS Care Coordination     Patients Home Cardiology Office [facility] Failure Clinic   Outpatient Care Team Social Worker   Social Worker Name: Lasandra Beech, [place] [phone]   Living arrangements for the past 2 months Single Family Home   Lives with: Spouse   Patient Current Insurance Coverage Traditional Medicare; Medicaid   Patient Has Concern With Paying Medical Bills No   Does Patient Have Prescription Coverage? Yes   Home Assistive Devices/Equipment None   DME Agency NA       Social History:                                                                             SDOH Screenings   Alcohol Screen: Low Risk    Last Alcohol Screening Score (AUDIT): 0  Depression (PHQ2-9): Not on file  Financial Resource Strain: Low Risk    Difficulty of Paying Living Expenses: Not very hard  Food Insecurity: No Food Insecurity    Worried About Running Out of Food in the Last Year: Never true   Ran Out of Food in the Last Year: Never true  Housing: Low Risk    Last Housing Risk Score: 0  Physical Activity: Inactive   Days of Exercise per Week: 0 days   Minutes of Exercise per Session: 0 min  Social Connections: Not on file  Stress: Not on file  Tobacco Use: Medium Risk   Smoking Tobacco Use:  Former   Smokeless Tobacco Use: Never  Transportation Needs: Personal assistant (Medical): Yes   Lack of Transportation (Non-Medical): No    SDOH Interventions: Financial Resources:    Not assessed  Food Insecurity:  Not assessed  Housing Insecurity:  Not assessed  Transportation:   Transportation Interventions: [employer] runs out of money so unable to afford gas to get to appts   Follow-up plan:     Pt to make clinic appt then set up ride through [employer]  CSW will check in later this week to confirm he made appt.  Will continue to follow and assist as needed  Burna Sis, LCSW Clinical Social Worker [facility] Desk#: [phone] Cell#: [phone]

## 2021-09-04 ENCOUNTER — Ambulatory Visit (HOSPITAL_COMMUNITY): Payer: Self-pay | Admitting: Pharmacist

## 2021-09-04 LAB — POCT INR: INR: 2.6 (ref 2.0–3.0)

## 2021-09-04 NOTE — Progress Notes (Signed)
LVAD INR 

## 2021-09-08 ENCOUNTER — Encounter (HOSPITAL_COMMUNITY): Payer: Self-pay | Admitting: Emergency Medicine

## 2021-09-08 ENCOUNTER — Emergency Department (HOSPITAL_COMMUNITY): Payer: 59

## 2021-09-08 ENCOUNTER — Inpatient Hospital Stay (HOSPITAL_COMMUNITY)
Admission: EM | Admit: 2021-09-08 | Discharge: 2021-09-11 | DRG: 191 | Disposition: A | Discharge status: Home / Self Care | Payer: 59 | Attending: Cardiology | Admitting: Cardiology

## 2021-09-08 ENCOUNTER — Other Ambulatory Visit: Payer: Self-pay

## 2021-09-08 DIAGNOSIS — I255 Ischemic cardiomyopathy: Secondary | ICD-10-CM | POA: Diagnosis present

## 2021-09-08 DIAGNOSIS — M1990 Unspecified osteoarthritis, unspecified site: Secondary | ICD-10-CM | POA: Diagnosis present

## 2021-09-08 DIAGNOSIS — M109 Gout, unspecified: Secondary | ICD-10-CM | POA: Diagnosis present

## 2021-09-08 DIAGNOSIS — G4700 Insomnia, unspecified: Secondary | ICD-10-CM | POA: Diagnosis present

## 2021-09-08 DIAGNOSIS — Z8261 Family history of arthritis: Secondary | ICD-10-CM

## 2021-09-08 DIAGNOSIS — I493 Ventricular premature depolarization: Secondary | ICD-10-CM | POA: Diagnosis present

## 2021-09-08 DIAGNOSIS — R0602 Shortness of breath: Secondary | ICD-10-CM | POA: Diagnosis not present

## 2021-09-08 DIAGNOSIS — I428 Other cardiomyopathies: Secondary | ICD-10-CM | POA: Diagnosis present

## 2021-09-08 DIAGNOSIS — Z8249 Family history of ischemic heart disease and other diseases of the circulatory system: Secondary | ICD-10-CM

## 2021-09-08 DIAGNOSIS — E785 Hyperlipidemia, unspecified: Secondary | ICD-10-CM | POA: Diagnosis present

## 2021-09-08 DIAGNOSIS — R059 Cough, unspecified: Secondary | ICD-10-CM

## 2021-09-08 DIAGNOSIS — J449 Chronic obstructive pulmonary disease, unspecified: Secondary | ICD-10-CM | POA: Diagnosis present

## 2021-09-08 DIAGNOSIS — E1142 Type 2 diabetes mellitus with diabetic polyneuropathy: Secondary | ICD-10-CM | POA: Diagnosis present

## 2021-09-08 DIAGNOSIS — I480 Paroxysmal atrial fibrillation: Secondary | ICD-10-CM | POA: Diagnosis present

## 2021-09-08 DIAGNOSIS — F39 Unspecified mood [affective] disorder: Secondary | ICD-10-CM | POA: Diagnosis present

## 2021-09-08 DIAGNOSIS — I2510 Atherosclerotic heart disease of native coronary artery without angina pectoris: Secondary | ICD-10-CM | POA: Diagnosis present

## 2021-09-08 DIAGNOSIS — Z7951 Long term (current) use of inhaled steroids: Secondary | ICD-10-CM

## 2021-09-08 DIAGNOSIS — Z95811 Presence of heart assist device: Secondary | ICD-10-CM

## 2021-09-08 DIAGNOSIS — Z825 Family history of asthma and other chronic lower respiratory diseases: Secondary | ICD-10-CM

## 2021-09-08 DIAGNOSIS — I110 Hypertensive heart disease with heart failure: Secondary | ICD-10-CM | POA: Diagnosis present

## 2021-09-08 DIAGNOSIS — Z20822 Contact with and (suspected) exposure to covid-19: Secondary | ICD-10-CM | POA: Diagnosis present

## 2021-09-08 DIAGNOSIS — E876 Hypokalemia: Secondary | ICD-10-CM | POA: Diagnosis present

## 2021-09-08 DIAGNOSIS — E119 Type 2 diabetes mellitus without complications: Secondary | ICD-10-CM

## 2021-09-08 DIAGNOSIS — Z7984 Long term (current) use of oral hypoglycemic drugs: Secondary | ICD-10-CM

## 2021-09-08 DIAGNOSIS — I472 Ventricular tachycardia, unspecified: Secondary | ICD-10-CM | POA: Diagnosis not present

## 2021-09-08 DIAGNOSIS — Z87891 Personal history of nicotine dependence: Secondary | ICD-10-CM

## 2021-09-08 DIAGNOSIS — Z951 Presence of aortocoronary bypass graft: Secondary | ICD-10-CM

## 2021-09-08 DIAGNOSIS — J441 Chronic obstructive pulmonary disease with (acute) exacerbation: Secondary | ICD-10-CM | POA: Diagnosis not present

## 2021-09-08 DIAGNOSIS — I5022 Chronic systolic (congestive) heart failure: Secondary | ICD-10-CM | POA: Diagnosis present

## 2021-09-08 DIAGNOSIS — E1165 Type 2 diabetes mellitus with hyperglycemia: Secondary | ICD-10-CM | POA: Diagnosis present

## 2021-09-08 DIAGNOSIS — Z833 Family history of diabetes mellitus: Secondary | ICD-10-CM

## 2021-09-08 DIAGNOSIS — Z7901 Long term (current) use of anticoagulants: Secondary | ICD-10-CM

## 2021-09-08 DIAGNOSIS — Z28310 Unvaccinated for covid-19: Secondary | ICD-10-CM

## 2021-09-08 DIAGNOSIS — Z79899 Other long term (current) drug therapy: Secondary | ICD-10-CM

## 2021-09-08 LAB — CBC
HCT: 43.8 % (ref 39.0–52.0)
Hemoglobin: 14.6 g/dL (ref 13.0–17.0)
MCH: 28.7 pg (ref 26.0–34.0)
MCHC: 33.3 g/dL (ref 30.0–36.0)
MCV: 86.2 fL (ref 80.0–100.0)
Platelets: 218 10*3/uL (ref 150–400)
RBC: 5.08 MIL/uL (ref 4.22–5.81)
RDW: 13.6 % (ref 11.5–15.5)
WBC: 9.6 10*3/uL (ref 4.0–10.5)
nRBC: 0 % (ref 0.0–0.2)

## 2021-09-08 LAB — PROTIME-INR
INR: 2.8 — ABNORMAL HIGH (ref 0.8–1.2)
Prothrombin Time: 29.4 seconds — ABNORMAL HIGH (ref 11.4–15.2)

## 2021-09-08 LAB — BASIC METABOLIC PANEL
Anion gap: 9 (ref 5–15)
BUN: 18 mg/dL (ref 6–20)
CO2: 26 mmol/L (ref 22–32)
Calcium: 8.8 mg/dL — ABNORMAL LOW (ref 8.9–10.3)
Chloride: 99 mmol/L (ref 98–111)
Creatinine, Ser: 0.89 mg/dL (ref 0.61–1.24)
GFR, Estimated: >60 mL/min (ref >60)
Glucose, Bld: 319 mg/dL — ABNORMAL HIGH (ref 70–99)
Potassium: 3.4 mmol/L — ABNORMAL LOW (ref 3.5–5.1)
Sodium: 134 mmol/L — ABNORMAL LOW (ref 135–145)

## 2021-09-08 LAB — TROPONIN I (HIGH SENSITIVITY): Troponin I (High Sensitivity): 11 ng/L (ref <18)

## 2021-09-08 MED ORDER — ALBUTEROL SULFATE HFA 108 (90 BASE) MCG/ACT IN AERS
8.0000 | INHALATION_SPRAY | Freq: Once | RESPIRATORY_TRACT | Status: AC
  Administered 2021-09-08: 8 via RESPIRATORY_TRACT
  Filled 2021-09-08: qty 6.7

## 2021-09-08 MED ORDER — METHYLPREDNISOLONE SODIUM SUCC 125 MG IJ SOLR
125.0000 mg | Freq: Once | INTRAMUSCULAR | Status: AC
  Administered 2021-09-09: 125 mg via INTRAVENOUS
  Filled 2021-09-08: qty 2

## 2021-09-08 NOTE — ED Provider Notes (Signed)
AP-EMERGENCY DEPT [facility] Emergency Department Provider Note MRN:  [id number]  Arrival date & time: 09/09/21     Chief Complaint   Chest Pain   History of Present Illness   Kevin Underwood is a 60 y.o. year-old male with a history of CAD, LVAD, diabetes, COPD presenting to the ED with chief complaint of chest pain.  Location: Central chest Duration: For 5 days Onset: Gradual Timing: Intermittent Description: Sharp Severity: Mild to moderate Exacerbating/Alleviating Factors: Happens with cough Associated Symptoms: Nasal congestion, cough, dyspnea on exertion Pertinent Negatives: No fever, no nausea vomiting, no diarrhea, no abdominal pain, no changes to his LVAD numbers  Additional History: Gets short of breath with only 1 or 2 steps, which is new over the past 4 to 5 days  Review of Systems  A complete 10 system review of systems was obtained and all systems are negative except as noted in the HPI and PMH.   Patient's Health History    Past Medical History:  Diagnosis Date   Arthritis    CAD (coronary artery disease)    a. s/p CABG in 11/2019 with LIMA-LAD, SVG-OM1, SVG-PDA and SVG-D1   CHF (congestive heart failure) (HCC)    a. EF < 20% by echo in 11/2019   COPD (chronic obstructive pulmonary disease) (HCC)    Essential hypertension    PAF (paroxysmal atrial fibrillation) (HCC)    Type 2 diabetes mellitus (HCC)     Past Surgical History:  Procedure Laterality Date   BACK SURGERY     CLIPPING OF ATRIAL APPENDAGE N/A 11/26/2019   Procedure: Clipping Of Atrial Appendage using AtriCure 40 Clip;  Surgeon: [name], MD;  Location: [facility] OR;  Service: Open Heart Surgery;  Laterality: N/A;   CORONARY ARTERY BYPASS GRAFT N/A 11/26/2019   Procedure: CORONARY ARTERY BYPASS GRAFTING (CABG) using endoscopic greater saphenous vein harvest: svc to OM; svc to Diag; svc to PD; and LIMA to LAD.;  Surgeon: [name], MD;  Location: [facility] OR;  Service: Open Heart  Surgery;  Laterality: N/A;   FOOT SURGERY     HAND SURGERY     INSERTION OF IMPLANTABLE LEFT VENTRICULAR ASSIST DEVICE N/A 11/17/2020   Procedure: INSERTION OF IMPLANTABLE LEFT VENTRICULAR ASSIST DEVICE - HM3;  Surgeon: [name], MD;  Location: [facility] OR;  Service: Open Heart Surgery;  Laterality: N/A;   INTRAOPERATIVE TRANSESOPHAGEAL ECHOCARDIOGRAM  12/04/2019   Procedure: Intraoperative Transesophageal Echocardiogram;  Surgeon: [name], MD;  Location: [facility] OR;  Service: Open Heart Surgery;;   MAZE N/A 11/26/2019   Procedure: MAZE using Bilateral Pulmonary Vein isolation.;  Surgeon: [name], MD;  Location: [facility] OR;  Service: Open Heart Surgery;  Laterality: N/A;   MULTIPLE EXTRACTIONS WITH ALVEOLOPLASTY N/A 11/16/2020   Procedure: MULTIPLE EXTRACTION WITH ALVEOLOPLASTY;  Surgeon: Sharman Cheek, DMD;  Location: [facility] OR;  Service: Dentistry;  Laterality: N/A;   PLACEMENT OF IMPELLA LEFT VENTRICULAR ASSIST DEVICE N/A 11/24/2019   Procedure: PLACEMENT OF IMPELLA 5.5 LEFT VENTRICULAR ASSIST DEVICE;  Surgeon: [name], MD;  Location: [facility] OR;  Service: Open Heart Surgery;  Laterality: N/A;   PLACEMENT OF IMPELLA LEFT VENTRICULAR ASSIST DEVICE Left 11/10/2020   Procedure: PLACEMENT OF IMPELLA 5.5 LEFT VENTRICULAR ASSIST DEVICE VIA  LEFT AVILLARY ARTERY;  Surgeon: [name], MD;  Location: [facility] OR;  Service: Open Heart Surgery;  Laterality: Left;   REMOVAL OF IMPELLA LEFT VENTRICULAR ASSIST DEVICE Right 12/04/2019   Procedure: REMOVAL OF IMPELLA  LEFT VENTRICULAR ASSIST DEVICE, right axilla;  Surgeon: [name], MD;  Location: [facility] OR;  Service: Open Heart Surgery;  Laterality: Right;   RIGHT/LEFT HEART CATH AND CORONARY ANGIOGRAPHY N/A 11/24/2019   Procedure: RIGHT/LEFT HEART CATH AND CORONARY ANGIOGRAPHY;  Surgeon: Laurey Morale, MD;  Location: [facility] INVASIVE CV LAB;  Service: Cardiovascular;  Laterality: N/A;   RIGHT/LEFT HEART CATH AND CORONARY/GRAFT ANGIOGRAPHY  N/A 11/09/2020   Procedure: RIGHT/LEFT HEART CATH AND CORONARY/GRAFT ANGIOGRAPHY;  Surgeon: [name], MD;  Location: [facility] INVASIVE CV LAB;  Service: Cardiovascular;  Laterality: N/A;   TEE WITHOUT CARDIOVERSION N/A 11/24/2019   Procedure: TRANSESOPHAGEAL ECHOCARDIOGRAM (TEE);  Surgeon: [name], MD;  Location: [facility] OR;  Service: Open Heart Surgery;  Laterality: N/A;   TEE WITHOUT CARDIOVERSION N/A 11/26/2019   Procedure: TRANSESOPHAGEAL ECHOCARDIOGRAM (TEE);  Surgeon: [name], MD;  Location: [facility] OR;  Service: Open Heart Surgery;  Laterality: N/A;   TEE WITHOUT CARDIOVERSION N/A 11/10/2020   Procedure: TRANSESOPHAGEAL ECHOCARDIOGRAM (TEE);  Surgeon: [name], MD;  Location: [facility] OR;  Service: Open Heart Surgery;  Laterality: N/A;   TEE WITHOUT CARDIOVERSION N/A 11/17/2020   Procedure: TRANSESOPHAGEAL ECHOCARDIOGRAM (TEE);  Surgeon: [name], MD;  Location: [facility] OR;  Service: Open Heart Surgery;  Laterality: N/A;    Family History  Problem Relation Age of Onset   Arthritis Other    Lung disease Other    Asthma Other    Diabetes Other    Heart disease Sister        Tumor?    Social History   Socioeconomic History   Marital status: Married    Spouse name: Not on file   Number of children: Not on file   Years of education: 12   Highest education level: Not on file  Occupational History   Not on file  Tobacco Use   Smoking status: Former    Packs/day: 1.00    Years: 30.00    Pack years: 30.00    Types: Cigarettes    Quit date: 12/20/2019    Years since quitting: 1.7   Smokeless tobacco: Never  Vaping Use   Vaping Use: Never used  Substance and Sexual Activity   Alcohol use: Yes    Comment: Prior history of excessive intake   Drug use: No   Sexual activity: Not on file  Other Topics Concern   Not on file  Social History Narrative   Not on file   Social Determinants of Health   Financial Resource Strain: Low Risk    Difficulty of Paying  Living Expenses: Not very hard  Food Insecurity: No Food Insecurity   Worried About Running Out of Food in the Last Year: Never true   Ran Out of Food in the Last Year: Never true  Transportation Needs: Unmet Transportation Needs   Lack of Transportation (Medical): Yes   Lack of Transportation (Non-Medical): No  Physical Activity: Inactive   Days of Exercise per Week: 0 days   Minutes of Exercise per Session: 0 min  Stress: Not on file  Social Connections: Not on file  Intimate Partner Violence: Not on file     Physical Exam   Vitals:   09/09/21 0200 09/09/21 0230  BP: (!) 137/100 (!) 116/92  Pulse: 92 95  Resp:    Temp:    SpO2: 93% 95%    CONSTITUTIONAL: Well-appearing, NAD NEURO:  Alert and oriented x 3, no focal deficits EYES:  eyes equal and reactive  ENT/NECK:  no LAD, no JVD CARDIO: Regular rate, well-perfused, normal S1 and S2 PULM: Diffuse wheezing GI/GU:  normal bowel sounds, non-distended, non-tender MSK/SPINE:  No gross deformities, no edema SKIN:  no rash, atraumatic PSYCH:  Appropriate speech and behavior  *Additional and/or pertinent findings included in MDM below  Diagnostic and Interventional Summary    EKG Interpretation  Date/Time:  Saturday September 08 2021 23:17:37 EDT Ventricular Rate:  98 PR Interval:  206 QRS Duration: 84 QT Interval:  366 QTC Calculation: 456 R Axis:   126 Text Interpretation: Sinus rhythm Multiform ventricular premature complexes Borderline prolonged PR interval Extensive anterior infarct, old ST elevation suggests acute pericarditis Artifact in lead(s) I II III aVR aVL V2 V3 V4 V5 V6 Confirmed by Kennis Carina [id number]) on 09/08/2021 11:29:53 PM       Labs Reviewed  BASIC METABOLIC PANEL - Abnormal; Notable for the following components:      Result Value   Sodium 134 (*)    Potassium 3.4 (*)    Glucose, Bld 319 (*)    Calcium 8.8 (*)    All other components within normal limits  PROTIME-INR - Abnormal; Notable for  the following components:   Prothrombin Time 29.4 (*)    INR 2.8 (*)    All other components within normal limits  RESP PANEL BY RT-PCR (FLU A&B, COVID) ARPGX2  CBC  TROPONIN I (HIGH SENSITIVITY)  TROPONIN I (HIGH SENSITIVITY)    DG Chest Port 1 View  Final Result      Medications  ipratropium-albuterol (DUONEB) 0.5-2.5 (3) MG/3ML nebulizer solution 3 mL (has no administration in time range)  albuterol (VENTOLIN HFA) 108 (90 Base) MCG/ACT inhaler 8 puff (8 puffs Inhalation Given 09/08/21 2332)  methylPREDNISolone sodium succinate (SOLU-MEDROL) 125 mg/2 mL injection 125 mg (125 mg Intravenous Given 09/09/21 0019)     Procedures  /  Critical Care Procedures  ED Course and Medical Decision Making  I have reviewed the triage vital signs, the nursing notes, and pertinent available records from the EMR.  Listed above are laboratory and imaging tests that I personally ordered, reviewed, and interpreted and then considered in my medical decision making (see below for details).  Patient presenting with cough, chest pain with cough, dyspnea on exertion, wheezing.  Favoring COPD exacerbation given the exam.  Does not appear fluid overloaded.  Patient does have an LVAD, numbers are all within his normal limits.  We will be reaching out to LVAD coordinator.  Providing COPD treatments and will reassess.  Currently with very reassuring vital signs, sitting comfortably. Clinical Course as of 09/09/21 0241  Sat Sep 08, 2021  2345 Spoke with LVAD coordinator mildly, spoke with Dr. Sherlie Ban of cardiology.  Does seem consistent with COPD, if doing better would be appropriate for discharge on standard COPD management. [MB]    Clinical Course User Index [MB] Sabas Sous, MD     Patient remains very symptomatic despite treatment, with ambulation became very short of breath, oxygen down to 90% after only a few steps.  Will admit to medicine.  Elmer Sow. Pilar Plate, MD [facility] Emergency [facility] [email or web address]  Final Clinical Impressions(s) / ED Diagnoses     ICD-10-CM   1. Chronic obstructive pulmonary disease with acute exacerbation (HCC)  J44.1     2. Cough  R05.9 DG Chest [facility] 1 View    DG Chest Port 1 View    3. LVAD (left ventricular assist device) present (HCC)  W65.681       ED Discharge Orders     None        Discharge Instructions Discussed with and Provided to Patient:   Discharge Instructions   None       Sabas Sous, MD 09/09/21 [id number]

## 2021-09-08 NOTE — ED Notes (Signed)
Pt states chest pain is worse with coughing

## 2021-09-08 NOTE — ED Triage Notes (Signed)
Pt with c/o chest pain that started today. Pt with LVAD. Pt states he is very weak and that he has had a cough for a week.

## 2021-09-08 NOTE — ED Notes (Signed)
VAD Nursing Coordinator paged to Dr Pilar Plate @ [phone]

## 2021-09-09 DIAGNOSIS — Z20822 Contact with and (suspected) exposure to covid-19: Secondary | ICD-10-CM | POA: Diagnosis present

## 2021-09-09 DIAGNOSIS — M109 Gout, unspecified: Secondary | ICD-10-CM | POA: Diagnosis present

## 2021-09-09 DIAGNOSIS — Z8249 Family history of ischemic heart disease and other diseases of the circulatory system: Secondary | ICD-10-CM | POA: Diagnosis not present

## 2021-09-09 DIAGNOSIS — F39 Unspecified mood [affective] disorder: Secondary | ICD-10-CM | POA: Diagnosis present

## 2021-09-09 DIAGNOSIS — R0602 Shortness of breath: Secondary | ICD-10-CM | POA: Diagnosis present

## 2021-09-09 DIAGNOSIS — I480 Paroxysmal atrial fibrillation: Secondary | ICD-10-CM

## 2021-09-09 DIAGNOSIS — J449 Chronic obstructive pulmonary disease, unspecified: Secondary | ICD-10-CM

## 2021-09-09 DIAGNOSIS — Z95811 Presence of heart assist device: Secondary | ICD-10-CM | POA: Diagnosis not present

## 2021-09-09 DIAGNOSIS — Z87891 Personal history of nicotine dependence: Secondary | ICD-10-CM | POA: Diagnosis not present

## 2021-09-09 DIAGNOSIS — E876 Hypokalemia: Secondary | ICD-10-CM | POA: Diagnosis present

## 2021-09-09 DIAGNOSIS — Z7901 Long term (current) use of anticoagulants: Secondary | ICD-10-CM | POA: Diagnosis not present

## 2021-09-09 DIAGNOSIS — I2510 Atherosclerotic heart disease of native coronary artery without angina pectoris: Secondary | ICD-10-CM | POA: Diagnosis present

## 2021-09-09 DIAGNOSIS — I255 Ischemic cardiomyopathy: Secondary | ICD-10-CM | POA: Diagnosis present

## 2021-09-09 DIAGNOSIS — I428 Other cardiomyopathies: Secondary | ICD-10-CM | POA: Diagnosis present

## 2021-09-09 DIAGNOSIS — Z28310 Unvaccinated for covid-19: Secondary | ICD-10-CM | POA: Diagnosis not present

## 2021-09-09 DIAGNOSIS — I5022 Chronic systolic (congestive) heart failure: Secondary | ICD-10-CM | POA: Diagnosis present

## 2021-09-09 DIAGNOSIS — M1990 Unspecified osteoarthritis, unspecified site: Secondary | ICD-10-CM | POA: Diagnosis present

## 2021-09-09 DIAGNOSIS — I110 Hypertensive heart disease with heart failure: Secondary | ICD-10-CM | POA: Diagnosis present

## 2021-09-09 DIAGNOSIS — J441 Chronic obstructive pulmonary disease with (acute) exacerbation: Secondary | ICD-10-CM | POA: Diagnosis present

## 2021-09-09 DIAGNOSIS — Z825 Family history of asthma and other chronic lower respiratory diseases: Secondary | ICD-10-CM | POA: Diagnosis not present

## 2021-09-09 DIAGNOSIS — E1142 Type 2 diabetes mellitus with diabetic polyneuropathy: Secondary | ICD-10-CM | POA: Diagnosis present

## 2021-09-09 DIAGNOSIS — E1165 Type 2 diabetes mellitus with hyperglycemia: Secondary | ICD-10-CM | POA: Diagnosis not present

## 2021-09-09 DIAGNOSIS — I493 Ventricular premature depolarization: Secondary | ICD-10-CM | POA: Diagnosis present

## 2021-09-09 DIAGNOSIS — Z833 Family history of diabetes mellitus: Secondary | ICD-10-CM | POA: Diagnosis not present

## 2021-09-09 DIAGNOSIS — E785 Hyperlipidemia, unspecified: Secondary | ICD-10-CM | POA: Diagnosis present

## 2021-09-09 DIAGNOSIS — G4700 Insomnia, unspecified: Secondary | ICD-10-CM | POA: Diagnosis present

## 2021-09-09 DIAGNOSIS — I472 Ventricular tachycardia, unspecified: Secondary | ICD-10-CM | POA: Diagnosis not present

## 2021-09-09 LAB — COMPREHENSIVE METABOLIC PANEL
ALT: 28 U/L (ref 0–44)
AST: 25 U/L (ref 15–41)
Albumin: 3.7 g/dL (ref 3.5–5.0)
Alkaline Phosphatase: 97 U/L (ref 38–126)
Anion gap: 9 (ref 5–15)
BUN: 17 mg/dL (ref 6–20)
CO2: 24 mmol/L (ref 22–32)
Calcium: 8.6 mg/dL — ABNORMAL LOW (ref 8.9–10.3)
Chloride: 100 mmol/L (ref 98–111)
Creatinine, Ser: 0.75 mg/dL (ref 0.61–1.24)
GFR, Estimated: >60 mL/min (ref >60)
Glucose, Bld: 309 mg/dL — ABNORMAL HIGH (ref 70–99)
Potassium: 3.8 mmol/L (ref 3.5–5.1)
Sodium: 133 mmol/L — ABNORMAL LOW (ref 135–145)
Total Bilirubin: 0.6 mg/dL (ref 0.3–1.2)
Total Protein: 7.2 g/dL (ref 6.5–8.1)

## 2021-09-09 LAB — CBC WITH DIFFERENTIAL/PLATELET
Abs Immature Granulocytes: 0.03 10*3/uL (ref 0.00–0.07)
Basophils Absolute: 0 10*3/uL (ref 0.0–0.1)
Basophils Relative: 1 %
Eosinophils Absolute: 0 10*3/uL (ref 0.0–0.5)
Eosinophils Relative: 0 %
HCT: 42 % (ref 39.0–52.0)
Hemoglobin: 14.2 g/dL (ref 13.0–17.0)
Immature Granulocytes: 0 %
Lymphocytes Relative: 8 %
Lymphs Abs: 0.7 10*3/uL (ref 0.7–4.0)
MCH: 29.1 pg (ref 26.0–34.0)
MCHC: 33.8 g/dL (ref 30.0–36.0)
MCV: 86.1 fL (ref 80.0–100.0)
Monocytes Absolute: 0.1 10*3/uL (ref 0.1–1.0)
Monocytes Relative: 1 %
Neutro Abs: 7.3 10*3/uL (ref 1.7–7.7)
Neutrophils Relative %: 90 %
Platelets: 215 10*3/uL (ref 150–400)
RBC: 4.88 MIL/uL (ref 4.22–5.81)
RDW: 13.6 % (ref 11.5–15.5)
WBC: 8.2 10*3/uL (ref 4.0–10.5)
nRBC: 0 % (ref 0.0–0.2)

## 2021-09-09 LAB — BLOOD GAS, VENOUS
Acid-Base Excess: 1.8 mmol/L (ref 0.0–2.0)
Bicarbonate: 24.8 mmol/L (ref 20.0–28.0)
FIO2: 21
O2 Saturation: 63.4 %
Patient temperature: 36.4
pCO2, Ven: 42.7 mmHg — ABNORMAL LOW (ref 44.0–60.0)
pH, Ven: 7.402 (ref 7.250–7.430)
pO2, Ven: 34.3 mmHg (ref 32.0–45.0)

## 2021-09-09 LAB — LACTATE DEHYDROGENASE: LDH: 204 U/L — ABNORMAL HIGH (ref 98–192)

## 2021-09-09 LAB — TROPONIN I (HIGH SENSITIVITY): Troponin I (High Sensitivity): 11 ng/L (ref <18)

## 2021-09-09 LAB — MAGNESIUM: Magnesium: 2.1 mg/dL (ref 1.7–2.4)

## 2021-09-09 LAB — HEMOGLOBIN A1C
Hgb A1c MFr Bld: 8.9 % — ABNORMAL HIGH (ref 4.8–5.6)
Mean Plasma Glucose: 208.73 mg/dL

## 2021-09-09 LAB — GLUCOSE, CAPILLARY
Glucose-Capillary: 359 mg/dL — ABNORMAL HIGH (ref 70–99)
Glucose-Capillary: 247 mg/dL — ABNORMAL HIGH (ref 70–99)
Glucose-Capillary: 254 mg/dL — ABNORMAL HIGH (ref 70–99)

## 2021-09-09 LAB — RESP PANEL BY RT-PCR (FLU A&B, COVID) ARPGX2
Influenza A by PCR: NEGATIVE
Influenza B by PCR: NEGATIVE
SARS Coronavirus 2 by RT PCR: NEGATIVE

## 2021-09-09 LAB — PROTIME-INR
INR: 2.8 — ABNORMAL HIGH (ref 0.8–1.2)
Prothrombin Time: 29.3 seconds — ABNORMAL HIGH (ref 11.4–15.2)

## 2021-09-09 LAB — CBG MONITORING, ED: Glucose-Capillary: 369 mg/dL — ABNORMAL HIGH (ref 70–99)

## 2021-09-09 MED ORDER — DAPAGLIFLOZIN PROPANEDIOL 10 MG PO TABS
10.0000 mg | ORAL_TABLET | Freq: Every day | ORAL | Status: DC
  Administered 2021-09-09: 10 mg via ORAL
  Filled 2021-09-09 (×2): qty 1

## 2021-09-09 MED ORDER — SERTRALINE HCL 25 MG PO TABS
50.0000 mg | ORAL_TABLET | Freq: Every day | ORAL | Status: DC
  Administered 2021-09-09 – 2021-09-11 (×3): 50 mg via ORAL
  Filled 2021-09-09 (×3): qty 2

## 2021-09-09 MED ORDER — ROSUVASTATIN CALCIUM 20 MG PO TABS
40.0000 mg | ORAL_TABLET | Freq: Every day | ORAL | Status: DC
  Administered 2021-09-09: 40 mg via ORAL
  Filled 2021-09-09: qty 2

## 2021-09-09 MED ORDER — INSULIN ASPART 100 UNIT/ML IJ SOLN
0.0000 [IU] | Freq: Every day | INTRAMUSCULAR | Status: DC
  Administered 2021-09-09: 3 [IU] via SUBCUTANEOUS
  Administered 2021-09-10: 4 [IU] via SUBCUTANEOUS

## 2021-09-09 MED ORDER — MORPHINE SULFATE (PF) 2 MG/ML IV SOLN
2.0000 mg | INTRAVENOUS | Status: DC | PRN
Start: 2021-09-09 — End: 2021-09-11

## 2021-09-09 MED ORDER — GABAPENTIN 300 MG PO CAPS
600.0000 mg | ORAL_CAPSULE | Freq: Two times a day (BID) | ORAL | Status: DC
  Administered 2021-09-10 – 2021-09-11 (×3): 600 mg via ORAL
  Filled 2021-09-09 (×3): qty 2

## 2021-09-09 MED ORDER — GABAPENTIN 300 MG PO CAPS
600.0000 mg | ORAL_CAPSULE | Freq: Three times a day (TID) | ORAL | Status: DC
  Administered 2021-09-09 (×2): 600 mg via ORAL
  Filled 2021-09-09 (×2): qty 2

## 2021-09-09 MED ORDER — OXYCODONE HCL 5 MG PO TABS: 5.0000 mg | ORAL_TABLET | ORAL | Status: DC | PRN

## 2021-09-09 MED ORDER — SACUBITRIL-VALSARTAN 24-26 MG PO TABS
1.0000 | ORAL_TABLET | Freq: Two times a day (BID) | ORAL | Status: DC
  Administered 2021-09-09 – 2021-09-11 (×5): 1 via ORAL
  Filled 2021-09-09 (×5): qty 1

## 2021-09-09 MED ORDER — OLANZAPINE 5 MG PO TABS
5.0000 mg | ORAL_TABLET | Freq: Every day | ORAL | Status: DC
  Administered 2021-09-09 – 2021-09-10 (×2): 5 mg via ORAL
  Filled 2021-09-09 (×3): qty 1

## 2021-09-09 MED ORDER — WARFARIN SODIUM 4 MG PO TABS
4.0000 mg | ORAL_TABLET | Freq: Once | ORAL | Status: DC
  Filled 2021-09-09: qty 1

## 2021-09-09 MED ORDER — AMIODARONE HCL 200 MG PO TABS
200.0000 mg | ORAL_TABLET | Freq: Every day | ORAL | Status: DC
  Administered 2021-09-09 – 2021-09-10 (×2): 200 mg via ORAL
  Filled 2021-09-09 (×2): qty 1

## 2021-09-09 MED ORDER — WARFARIN SODIUM 2 MG PO TABS
2.0000 mg | ORAL_TABLET | Freq: Once | ORAL | Status: AC
  Administered 2021-09-09: 2 mg via ORAL
  Filled 2021-09-09: qty 1

## 2021-09-09 MED ORDER — TRAZODONE HCL 50 MG PO TABS
150.0000 mg | ORAL_TABLET | Freq: Every day | ORAL | Status: DC
  Administered 2021-09-09 – 2021-09-10 (×2): 150 mg via ORAL
  Filled 2021-09-09 (×2): qty 3

## 2021-09-09 MED ORDER — ACETAMINOPHEN 325 MG PO TABS: 650.0000 mg | ORAL_TABLET | ORAL | Status: DC | PRN

## 2021-09-09 MED ORDER — ONDANSETRON HCL 4 MG/2ML IJ SOLN: 4.0000 mg | Freq: Four times a day (QID) | INTRAMUSCULAR | Status: DC | PRN

## 2021-09-09 MED ORDER — POTASSIUM CHLORIDE 20 MEQ PO PACK
20.0000 meq | PACK | Freq: Once | ORAL | Status: AC
  Administered 2021-09-09: 20 meq via ORAL
  Filled 2021-09-09: qty 1

## 2021-09-09 MED ORDER — PREDNISONE 20 MG PO TABS: 40.0000 mg | ORAL_TABLET | Freq: Every day | ORAL | Status: DC

## 2021-09-09 MED ORDER — ALBUTEROL SULFATE (2.5 MG/3ML) 0.083% IN NEBU: 2.5000 mg | INHALATION_SOLUTION | RESPIRATORY_TRACT | Status: DC | PRN

## 2021-09-09 MED ORDER — WARFARIN - PHYSICIAN DOSING INPATIENT: Freq: Every day | Status: DC

## 2021-09-09 MED ORDER — WARFARIN - PHARMACIST DOSING INPATIENT: Freq: Every day | Status: DC

## 2021-09-09 MED ORDER — DOXYCYCLINE HYCLATE 100 MG PO TABS
100.0000 mg | ORAL_TABLET | Freq: Two times a day (BID) | ORAL | Status: DC
  Administered 2021-09-09 – 2021-09-11 (×5): 100 mg via ORAL
  Filled 2021-09-09 (×5): qty 1

## 2021-09-09 MED ORDER — ALBUTEROL SULFATE (2.5 MG/3ML) 0.083% IN NEBU
INHALATION_SOLUTION | RESPIRATORY_TRACT | Status: AC
  Administered 2021-09-09: 2.5 mg
  Filled 2021-09-09: qty 3

## 2021-09-09 MED ORDER — INSULIN ASPART 100 UNIT/ML IJ SOLN
0.0000 [IU] | Freq: Three times a day (TID) | INTRAMUSCULAR | Status: DC
  Administered 2021-09-09 (×2): 15 [IU] via SUBCUTANEOUS
  Administered 2021-09-09: 5 [IU] via SUBCUTANEOUS
  Administered 2021-09-10: 3 [IU] via SUBCUTANEOUS
  Administered 2021-09-10: 5 [IU] via SUBCUTANEOUS
  Filled 2021-09-09: qty 1

## 2021-09-09 MED ORDER — MOMETASONE FURO-FORMOTEROL FUM 200-5 MCG/ACT IN AERO
2.0000 | INHALATION_SPRAY | Freq: Two times a day (BID) | RESPIRATORY_TRACT | Status: DC
  Administered 2021-09-09: 2 via RESPIRATORY_TRACT
  Filled 2021-09-09: qty 8.8

## 2021-09-09 MED ORDER — ROSUVASTATIN CALCIUM 20 MG PO TABS
20.0000 mg | ORAL_TABLET | Freq: Every day | ORAL | Status: DC
  Administered 2021-09-10 – 2021-09-11 (×2): 20 mg via ORAL
  Filled 2021-09-09 (×2): qty 1

## 2021-09-09 MED ORDER — IPRATROPIUM-ALBUTEROL 0.5-2.5 (3) MG/3ML IN SOLN
3.0000 mL | Freq: Once | RESPIRATORY_TRACT | Status: AC
  Administered 2021-09-09: 3 mL via RESPIRATORY_TRACT
  Filled 2021-09-09: qty 3

## 2021-09-09 MED ORDER — PANTOPRAZOLE SODIUM 40 MG PO TBEC
40.0000 mg | DELAYED_RELEASE_TABLET | Freq: Every day | ORAL | Status: DC
  Administered 2021-09-09 – 2021-09-11 (×3): 40 mg via ORAL
  Filled 2021-09-09 (×3): qty 1

## 2021-09-09 MED ORDER — ACETAMINOPHEN 650 MG RE SUPP: 650.0000 mg | Freq: Four times a day (QID) | RECTAL | Status: DC | PRN

## 2021-09-09 MED ORDER — WARFARIN SODIUM 5 MG PO TABS: 5.0000 mg | ORAL_TABLET | Freq: Once | ORAL | Status: DC

## 2021-09-09 MED ORDER — ONDANSETRON HCL 4 MG PO TABS: 4.0000 mg | ORAL_TABLET | Freq: Four times a day (QID) | ORAL | Status: DC | PRN

## 2021-09-09 MED ORDER — BUDESONIDE 0.25 MG/2ML IN SUSP
0.2500 mg | Freq: Two times a day (BID) | RESPIRATORY_TRACT | Status: DC
  Administered 2021-09-09 – 2021-09-11 (×4): 0.25 mg via RESPIRATORY_TRACT
  Filled 2021-09-09 (×4): qty 2

## 2021-09-09 MED ORDER — IPRATROPIUM-ALBUTEROL 0.5-2.5 (3) MG/3ML IN SOLN
3.0000 mL | Freq: Four times a day (QID) | RESPIRATORY_TRACT | Status: DC
  Administered 2021-09-09 (×3): 3 mL via RESPIRATORY_TRACT
  Filled 2021-09-09 (×3): qty 3

## 2021-09-09 MED ORDER — ACETAMINOPHEN 325 MG PO TABS: 650.0000 mg | ORAL_TABLET | Freq: Four times a day (QID) | ORAL | Status: DC | PRN

## 2021-09-09 MED ORDER — IPRATROPIUM-ALBUTEROL 0.5-2.5 (3) MG/3ML IN SOLN
3.0000 mL | Freq: Three times a day (TID) | RESPIRATORY_TRACT | Status: DC
  Administered 2021-09-10: 3 mL via RESPIRATORY_TRACT
  Filled 2021-09-09: qty 3

## 2021-09-09 MED ORDER — ARFORMOTEROL TARTRATE 15 MCG/2ML IN NEBU
15.0000 ug | INHALATION_SOLUTION | Freq: Two times a day (BID) | RESPIRATORY_TRACT | Status: DC
  Administered 2021-09-09 – 2021-09-10 (×2): 15 ug via RESPIRATORY_TRACT
  Filled 2021-09-09 (×2): qty 2

## 2021-09-09 MED ORDER — METHYLPREDNISOLONE SODIUM SUCC 125 MG IJ SOLR
125.0000 mg | Freq: Every day | INTRAMUSCULAR | Status: AC
  Administered 2021-09-09: 125 mg via INTRAVENOUS
  Filled 2021-09-09: qty 2

## 2021-09-09 NOTE — H&P (Signed)
TRH H&P    Patient Demographics:    Kevin Underwood, is a 60 y.o. male  MRN: [id number]  DOB - 1961-07-14  Admit Date - 09/08/2021  Referring MD/NP/PA: Sedonia Small  Outpatient Primary MD for the patient is Monico Blitz, MD  Patient coming from: Home  Chief complaint- dyspnea    HPI:    Kevin Underwood  is a 60 y.o. male, with history of coronary artery disease, CHF, COPD, essential hypertension, paroxysmal atrial fibrillation, and type 2 diabetes mellitus, LVAD, and more presents the ED with a chief complaint of dyspnea.  Patient reports that he had 1 week of dyspnea.  Its been progressively worse since it started.  It is worse with exertion, better with rest.  Patient reports he is barely able to ambulate because his shortness of breath is so bad today.  He reports a cough with clear sputum.  He reports that he has been wheezing at home.  He has been using his inhalers, and he uses nebulizer 3 times on the day of presentation to the ED.  Patient denies any fevers.  Patient is not vaccinated for COVID, but does have a negative COVID.  Patient denies any nausea, vomiting, diarrhea, constipation, sores or rashes on his body, chest pain, dizziness.  Patient has no other complaints at this time.  Patient quit smoking 2 years ago.  He reports since that time he is not been hospitalized as often for COPD.  Patient does not drink, does not use illicit drugs.  Patient prefers to be full code, but with the LVAD in place that will be partial.  No CPR is contraindicated.  Patient reports he is signing paperwork to that effect previously.  In the ED Temp 97.6, heart rate 85-1 47, respiratory rate 19-25, blood pressure 116/92 satting 95% No leukocytosis, hemoglobin stable, Chemistry panel shows a very slight hypokalemia 3.4, and a hyperglycemia of 319 Troponin 11, 11 Negative respiratory panel Chest x-ray shows LVAD in place EKG is  interference from the LVAD shows sinus rhythm with a heart rate of 98 Albuterol DuoNeb and Solu-Medrol given in the ED ED physician reports that he spoke with the LVAD coordinator and with Dr. Algernon Huxley of cardiology.  If he had symptomatic improvement in the ED he was safe to be discharged home. When ambulating to the bathroom, patient reports he took a few steps and then almost "gave out" oxygen sats are documented in the lowest 90s, patient has been tachypneic, and this is not his baseline Admission requested for COPD exacerbation    Review of systems:    In addition to the HPI above,  No Fever-chills, No Headache, No changes with Vision or hearing, No problems swallowing food or Liquids, No Abdominal pain, No Nausea or Vomiting, bowel movements are regular, No Blood in stool or Urine, No dysuria, No new skin rashes or bruises, No new joints pains-aches,  No new weakness, tingling, numbness in any extremity, No recent weight gain or loss, No polyuria, polydypsia or polyphagia, No significant Mental Stressors.  All other systems reviewed  and are negative.    Past History of the following :    Past Medical History:  Diagnosis Date   Arthritis    CAD (coronary artery disease)    a. s/p CABG in 11/2019 with LIMA-LAD, SVG-OM1, SVG-PDA and SVG-D1   CHF (congestive heart failure) (HCC)    a. EF < 20% by echo in 11/2019   COPD (chronic obstructive pulmonary disease) ([place])    Essential hypertension    PAF (paroxysmal atrial fibrillation) (HCC)    Type 2 diabetes mellitus ([place])       Past Surgical History:  Procedure Laterality Date   BACK SURGERY     CLIPPING OF ATRIAL APPENDAGE N/A 11/26/2019   Procedure: Clipping Of Atrial Appendage using AtriCure 40 Clip;  Surgeon: Wonda Olds, MD;  Location: [place];  Service: Open Heart Surgery;  Laterality: N/A;   CORONARY ARTERY BYPASS GRAFT N/A 11/26/2019   Procedure: CORONARY ARTERY BYPASS GRAFTING (CABG) using endoscopic greater  saphenous vein harvest: svc to OM; svc to Diag; svc to PD; and LIMA to LAD.;  Surgeon: Wonda Olds, MD;  Location: [place];  Service: Open Heart Surgery;  Laterality: N/A;   FOOT SURGERY     HAND SURGERY     INSERTION OF IMPLANTABLE LEFT VENTRICULAR ASSIST DEVICE N/A 11/17/2020   Procedure: INSERTION OF IMPLANTABLE LEFT VENTRICULAR ASSIST DEVICE - HM3;  Surgeon: Wonda Olds, MD;  Location: [place];  Service: Open Heart Surgery;  Laterality: N/A;   INTRAOPERATIVE TRANSESOPHAGEAL ECHOCARDIOGRAM  12/04/2019   Procedure: Intraoperative Transesophageal Echocardiogram;  Surgeon: Wonda Olds, MD;  Location[facility];  Service: Open Heart Surgery;;   MAZE N/A 11/26/2019   Procedure: MAZE using Bilateral Pulmonary Vein isolation.;  Surgeon: Wonda Olds, MD;  Location: [place];  Service: Open Heart Surgery;  Laterality: N/A;   MULTIPLE EXTRACTIONS WITH ALVEOLOPLASTY N/A 11/16/2020   Procedure: MULTIPLE EXTRACTION WITH ALVEOLOPLASTY;  Surgeon: Charlaine Dalton, DMD;  Location: [place];  Service: Dentistry;  Laterality: N/A;   PLACEMENT OF IMPELLA LEFT VENTRICULAR ASSIST DEVICE N/A 11/24/2019   Procedure: PLACEMENT OF IMPELLA 5.5 LEFT VENTRICULAR ASSIST DEVICE;  Surgeon: Wonda Olds, MD;  Location: [place];  Service: Open Heart Surgery;  Laterality: N/A;   PLACEMENT OF IMPELLA LEFT VENTRICULAR ASSIST DEVICE Left 11/10/2020   Procedure: PLACEMENT OF IMPELLA 5.5 LEFT VENTRICULAR ASSIST DEVICE VIA  LEFT AVILLARY ARTERY;  Surgeon: Wonda Olds, MD;  Location: [place];  Service: Open Heart Surgery;  Laterality: Left;   REMOVAL OF IMPELLA LEFT VENTRICULAR ASSIST DEVICE Right 12/04/2019   Procedure: REMOVAL OF IMPELLA LEFT VENTRICULAR ASSIST DEVICE, right axilla;  Surgeon: Wonda Olds, MD;  Location: [place];  Service: Open Heart Surgery;  Laterality: Right;   RIGHT/LEFT HEART CATH AND CORONARY ANGIOGRAPHY N/A 11/24/2019   Procedure: RIGHT/LEFT HEART CATH AND CORONARY ANGIOGRAPHY;  Surgeon:  Larey Dresser, MD;  Location: [place] [facility];  Service: Cardiovascular;  Laterality: N/A;   RIGHT/LEFT HEART CATH AND CORONARY/GRAFT ANGIOGRAPHY N/A 11/09/2020   Procedure: RIGHT/LEFT HEART CATH AND CORONARY/GRAFT ANGIOGRAPHY;  Surgeon: Nelva Bush, MD;  Location: [place] [facility];  Service: Cardiovascular;  Laterality: N/A;   TEE WITHOUT CARDIOVERSION N/A 11/24/2019   Procedure: TRANSESOPHAGEAL ECHOCARDIOGRAM (TEE);  Surgeon: Wonda Olds, MD;  Location: [place];  Service: Open Heart Surgery;  Laterality: N/A;   TEE WITHOUT CARDIOVERSION N/A 11/26/2019   Procedure: TRANSESOPHAGEAL ECHOCARDIOGRAM (TEE);  Surgeon: Wonda Olds, MD;  Location: [place];  Service: Open Heart Surgery;  Laterality: N/A;   TEE WITHOUT CARDIOVERSION N/A 11/10/2020   Procedure: TRANSESOPHAGEAL ECHOCARDIOGRAM (TEE);  Surgeon: Wonda Olds, MD;  Location: [place];  Service: Open Heart Surgery;  Laterality: N/A;   TEE WITHOUT CARDIOVERSION N/A 11/17/2020   Procedure: TRANSESOPHAGEAL ECHOCARDIOGRAM (TEE);  Surgeon: Wonda Olds, MD;  Location: [place];  Service: Open Heart Surgery;  Laterality: N/A;      Social History:      Social History   Tobacco Use   Smoking status: Former    Packs/day: 1.00    Years: 30.00    Pack years: 30.00    Types: Cigarettes    Quit date: 12/20/2019    Years since quitting: 1.7   Smokeless tobacco: Never  Substance Use Topics   Alcohol use: Yes    Comment: Prior history of excessive intake       Family History :     Family History  Problem Relation Age of Onset   Arthritis Other    Lung disease Other    Asthma Other    Diabetes Other    Heart disease Sister        Tumor?      Home Medications:   Prior to Admission medications   Medication Sig Start Date End Date Taking? Authorizing Provider  albuterol (PROVENTIL) (2.5 MG/3ML) 0.083% nebulizer solution Take 3 mLs (2.5 mg total) by nebulization every 6 (six) hours as needed for wheezing or  shortness of breath. 08/07/21   Larey Dresser, MD  amiodarone (PACERONE) 200 MG tablet Take 1 tablet (200 mg total) by mouth daily. Take 1 pill twice daily for 10 days and then resume a dose of 1 pill daily 07/12/21   Larey Dresser, MD  blood glucose meter kit and supplies KIT Dispense based on patient and insurance preference. Use to check CBG's three times a day. (FOR ICD-9 250.00, 250.01). 09/25/20   Barton Dubois, MD  colchicine 0.6 MG tablet Take 1 tablet (0.6 mg total) by mouth as needed. Patient not taking: No sig reported 12/04/20   Larey Dresser, MD  COMBIVENT RESPIMAT 20-100 MCG/ACT AERS respimat Inhale 1 puff into the lungs every 6 (six) hours as needed. 12/08/19   Gold, Wilder Glade, PA-C  cyclobenzaprine (FLEXERIL) 5 MG tablet Take 1-2 tablets (5-10 mg total) by mouth 2 (two) times daily as needed for muscle spasms. Patient not taking: Reported on 07/12/2021 01/08/21   Larey Dresser, MD  dapagliflozin propanediol (FARXIGA) 5 MG TABS tablet Take 1 tablet (5 mg total) by mouth daily before breakfast. Patient taking differently: Take 10 mg by mouth daily before breakfast. 09/25/20   Barton Dubois, MD  doxycycline (VIBRAMYCIN) 100 MG capsule Take 1 capsule (100 mg total) by mouth 2 (two) times daily. 08/16/21   Larey Dresser, MD  gabapentin (NEURONTIN) 600 MG tablet Take 1 tablet (600 mg total) by mouth 3 (three) times daily. 09/25/20   Barton Dubois, MD  metFORMIN (GLUCOPHAGE) 1000 MG tablet TAKE 1 TABLET (1,000 MG TOTAL) BY MOUTH IN THE MORNING AND AT BEDTIME. 06/14/21   Larey Dresser, MD  mometasone-formoterol [facility]) 200-5 MCG/ACT AERO Inhale 2 puffs into the lungs in the morning and at bedtime. 09/25/20   Barton Dubois, MD  Multiple Vitamin (MULTIVITAMIN WITH MINERALS) TABS tablet Take 1 tablet by mouth daily. 12/09/19   Gold, Wayne E, PA-C  OLANZapine (ZYPREXA) 5 MG tablet Take 5 mg by mouth at bedtime. 10/25/20   [provider]  pantoprazole (PROTONIX) 40 MG tablet  Take 1 tablet (40 mg total) by mouth daily. 08/01/21   Larey Dresser, MD  potassium chloride SA (KLOR-CON) 20 MEQ tablet Take 2 tablets (40 mEq total) by mouth daily. Patient not taking: No sig reported 11/28/20   Lyda Jester M, PA-C  rosuvastatin (CRESTOR) 40 MG tablet Take 1 tablet (40 mg total) by mouth daily. 09/25/20   Barton Dubois, MD  sacubitril-valsartan (ENTRESTO) 24-26 MG Take 1 tablet by mouth 2 (two) times daily. 07/12/21   Larey Dresser, MD  sertraline (ZOLOFT) 50 MG tablet TAKE 1 TABLET BY MOUTH EVERY DAY 05/28/21   Larey Dresser, MD  Tetrahydrozoline HCl (REDNESS RELIEVER EYE DROPS OP) Apply 2 drops to eye daily as needed.    [provider]  traZODone (DESYREL) 150 MG tablet TAKE 1 TABLET BY MOUTH AT BEDTIME. 01/22/21   Larey Dresser, MD  warfarin (COUMADIN) 4 MG tablet Take 6 mg (1.5 tabs) on Mon/Wed/Fri and 4 mg (1 tab) all other days or as directed by HF Clinic 07/10/21   Orma Render, RPH-CPP  enoxaparin (LOVENOX) 40 MG/0.4ML injection INJECT 0.4 MLS (40 MG TOTAL) INTO THE SKIN EVERY 12 (TWELVE) HOURS. 03/14/21 07/26/21  Larey Dresser, MD     Allergies:    No Known Allergies   Physical Exam:   Vitals  Blood pressure (!) 89/76, pulse 90, temperature 97.6 F (36.4 C), temperature source Oral, resp. rate (!) 21, height $RemoveBe'5\' 10"'MDdPzBZYG$  (1.778 m), weight 90.7 kg, SpO2 97 %. 1.  General: Patient lying supine in bed,  no acute distress   2. Psychiatric: Alert and oriented x 3, mood and behavior normal for situation, pleasant and cooperative with exam   3. Neurologic: Speech and language are normal, face is symmetric, moves all 4 extremities voluntarily, at baseline without acute deficits on limited exam   4. HEENMT:  Head is atraumatic, normocephalic, pupils reactive to light, neck is supple, trachea is midline, mucous membranes are moist   5. Respiratory : With significant wheezing throughout both lung fields, no rhonchi, rales, no cyanosis, speaking  in full sentences, slight engagement of accessory muscles,  6. Cardiovascular : Heart rate normal, rhythm is regular, hum from LVAD present, no peripheral edema, peripheral pulses palpated   7. Gastrointestinal:  Abdomen is soft, nondistended, nontender to palpation bowel sounds active, no masses or organomegaly palpated   8. Skin:  Skin is warm, dry and intact without rashes, acute lesions, or ulcers on limited exam   9.Musculoskeletal:  No acute deformities or trauma, no asymmetry in tone, no peripheral edema, peripheral pulses palpated, no tenderness to palpation in the extremities     Data Review:    CBC Recent Labs  Lab 09/08/21 2315  WBC 9.6  HGB 14.6  HCT 43.8  PLT 218  MCV 86.2  MCH 28.7  MCHC 33.3  RDW 13.6   ------------------------------------------------------------------------------------------------------------------  Results for orders placed or performed during the hospital encounter of 09/08/21 (from the past 48 hour(s))  Basic metabolic panel     Status: Abnormal   Collection Time: 09/08/21 11:15 PM  Result Value Ref Range   Sodium 134 (L) 135 - 145 mmol/L   Potassium 3.4 (L) 3.5 - 5.1 mmol/L   Chloride 99 98 - 111 mmol/L   CO2 26 22 - 32 mmol/L   Glucose, Bld 319 (H) 70 - 99 mg/dL    Comment: Glucose reference range applies only to samples taken after  fasting for at least 8 hours.   BUN 18 6 - 20 mg/dL   Creatinine, Ser 0.89 0.61 - 1.24 mg/dL   Calcium 8.8 (L) 8.9 - 10.3 mg/dL   GFR, Estimated >60 >60 mL/min    Comment: (NOTE) Calculated using the CKD-EPI Creatinine Equation (2021)    Anion gap 9 5 - 15    Comment: Performed at [facility], [address], [place], [place] [address]  CBC     Status: None   Collection Time: 09/08/21 11:15 PM  Result Value Ref Range   WBC 9.6 4.0 - 10.5 K/uL   RBC 5.08 4.22 - 5.81 MIL/uL   Hemoglobin 14.6 13.0 - 17.0 g/dL   HCT 43.8 39.0 - 52.0 %   MCV 86.2 80.0 - 100.0 fL   MCH 28.7 26.0 - 34.0 pg   MCHC  33.3 30.0 - 36.0 g/dL   RDW 13.6 11.5 - 15.5 %   Platelets 218 150 - 400 K/uL   nRBC 0.0 0.0 - 0.2 %    Comment: Performed at [facility], [address], [place], [place] [address]  Troponin I (High Sensitivity)     Status: None   Collection Time: 09/08/21 11:15 PM  Result Value Ref Range   Troponin I (High Sensitivity) 11 <18 ng/L    Comment: (NOTE) Elevated high sensitivity troponin I (hsTnI) values and significant  changes across serial measurements may suggest ACS but many other  chronic and acute conditions are known to elevate hsTnI results.  Refer to the "Links" section for chest pain algorithms and additional  guidance. Performed at [facility], [address] [place], [place] [address]   Protime-INR (order if Patient is taking Coumadin / Warfarin)     Status: Abnormal   Collection Time: 09/08/21 11:15 PM  Result Value Ref Range   Prothrombin Time 29.4 (H) 11.4 - 15.2 seconds   INR 2.8 (H) 0.8 - 1.2    Comment: (NOTE) INR goal varies based on device and disease states. Performed at [facility], [address] [place], [place] [address]   Resp Panel by RT-PCR (Flu A&B, Covid) Nasopharyngeal Swab     Status: None   Collection Time: 09/08/21 11:59 PM   Specimen: Nasopharyngeal Swab; Nasopharyngeal(NP) swabs in vial transport medium  Result Value Ref Range   SARS Coronavirus 2 by RT PCR NEGATIVE NEGATIVE    Comment: (NOTE) SARS-CoV-2 target nucleic acids are NOT DETECTED.  The SARS-CoV-2 RNA is generally detectable in upper respiratory specimens during the acute phase of infection. The lowest concentration of SARS-CoV-2 viral copies this assay can detect is 138 copies/mL. A negative result does not preclude SARS-Cov-2 infection and should not be used as the sole basis for treatment or other patient management decisions. A negative result may occur with  improper specimen collection/handling, submission of specimen other than nasopharyngeal swab, presence of viral  mutation(s) within the areas targeted by this assay, and inadequate number of viral copies(<138 copies/mL). A negative result must be combined with clinical observations, patient history, and epidemiological information. The expected result is Negative.  Fact Sheet for Patients:  EntrepreneurPulse.com.au  Fact Sheet for Healthcare Providers:  IncredibleEmployment.be  This test is no t yet approved or cleared by the [place] FDA and  has been authorized for detection and/or diagnosis of SARS-CoV-2 by FDA under an Emergency Use Authorization (EUA). This EUA will remain  in effect (meaning this test can be used) for the duration of the COVID-19 declaration under Section 564(b)(1)  of the Act, 21 U.S.C.section 360bbb-3(b)(1), unless the authorization is terminated  or revoked sooner.       Influenza A by PCR NEGATIVE NEGATIVE   Influenza B by PCR NEGATIVE NEGATIVE    Comment: (NOTE) The Xpert Xpress SARS-CoV-2/FLU/RSV plus assay is intended as an aid in the diagnosis of influenza from Nasopharyngeal swab specimens and should not be used as a sole basis for treatment. Nasal washings and aspirates are unacceptable for Xpert Xpress SARS-CoV-2/FLU/RSV testing.  Fact Sheet for Patients: EntrepreneurPulse.com.au  Fact Sheet for Healthcare Providers: IncredibleEmployment.be  This test is not yet approved or cleared by the [place] FDA and has been authorized for detection and/or diagnosis of SARS-CoV-2 by FDA under an Emergency Use Authorization (EUA). This EUA will remain in effect (meaning this test can be used) for the duration of the COVID-19 declaration under Section 564(b)(1) of the Act, 21 U.S.C. section 360bbb-3(b)(1), unless the authorization is terminated or revoked.  Performed at [facility], [address] [place], [place] [address]   Troponin I (High Sensitivity)     Status: None    Collection Time: 09/09/21  1:32 AM  Result Value Ref Range   Troponin I (High Sensitivity) 11 <18 ng/L    Comment: (NOTE) Elevated high sensitivity troponin I (hsTnI) values and significant  changes across serial measurements may suggest ACS but many other  chronic and acute conditions are known to elevate hsTnI results.  Refer to the "Links" section for chest pain algorithms and additional  guidance. Performed at [facility], [address] [place], [place] [address]   Blood gas, venous     Status: Abnormal   Collection Time: 09/09/21  3:13 AM  Result Value Ref Range   FIO2 21.00    pH, Ven 7.402 7.250 - 7.430   pCO2, Ven 42.7 (L) 44.0 - 60.0 mmHg   pO2, Ven 34.3 32.0 - 45.0 mmHg   Bicarbonate 24.8 20.0 - 28.0 mmol/L   Acid-Base Excess 1.8 0.0 - 2.0 mmol/L   O2 Saturation 63.4 %   Patient temperature 36.4     Comment: Performed at [facility], [address], [place], [place] [address]    Chemistries  Recent Labs  Lab 09/08/21 2315  NA 134*  K 3.4*  CL 99  CO2 26  GLUCOSE 319*  BUN 18  CREATININE 0.89  CALCIUM 8.8*   ------------------------------------------------------------------------------------------------------------------  ------------------------------------------------------------------------------------------------------------------ GFR: Estimated Creatinine Clearance: 100 mL/min (by C-G formula based on SCr of 0.89 mg/dL). Liver Function Tests: No results for input(s): AST, ALT, ALKPHOS, BILITOT, PROT, ALBUMIN in the last 168 hours. No results for input(s): LIPASE, AMYLASE in the last 168 hours. No results for input(s): AMMONIA in the last 168 hours. Coagulation Profile: Recent Labs  Lab 09/04/21 0000 09/08/21 2315  INR 2.6 2.8*   Cardiac Enzymes: No results for input(s): CKTOTAL, CKMB, CKMBINDEX, TROPONINI in the last 168 hours. BNP (last 3 results) No results for input(s): PROBNP in the last 8760 hours. HbA1C: No results for input(s): HGBA1C  in the last 72 hours. CBG: No results for input(s): GLUCAP in the last 168 hours. Lipid Profile: No results for input(s): CHOL, HDL, LDLCALC, TRIG, CHOLHDL, LDLDIRECT in the last 72 hours. Thyroid Function Tests: No results for input(s): TSH, T4TOTAL, FREET4, T3FREE, THYROIDAB in the last 72 hours. Anemia Panel: No results for input(s): VITAMINB12, FOLATE, FERRITIN, TIBC, IRON, RETICCTPCT in the last 72 hours.  --------------------------------------------------------------------------------------------------------------- Urine analysis:    Component Value Date/Time   COLORURINE YELLOW 11/24/2020 1830  APPEARANCEUR HAZY (A) 11/24/2020 1830   LABSPEC 1.017 11/24/2020 1830   PHURINE 6.0 11/24/2020 1830   GLUCOSEU NEGATIVE 11/24/2020 1830   HGBUR NEGATIVE 11/24/2020 1830   BILIRUBINUR NEGATIVE 11/24/2020 1830   KETONESUR NEGATIVE 11/24/2020 1830   PROTEINUR NEGATIVE 11/24/2020 1830   NITRITE NEGATIVE 11/24/2020 1830   LEUKOCYTESUR NEGATIVE 11/24/2020 1830      Imaging Results:    DG Chest Port 1 View  Result Date: 09/08/2021 CLINICAL DATA:  Chest pain for 1 day with cough EXAM: PORTABLE CHEST 1 VIEW COMPARISON:  07/12/2021 FINDINGS: Cardiac shadow is stable. Postsurgical changes are noted as well as an LVAD in place. Aortic calcifications are seen. Lungs are well aerated without focal infiltrate or effusion. No bony abnormality is noted. Old left clavicular fracture is seen. IMPRESSION: No acute abnormality noted.  LVAD is seen in place. Electronically Signed   By: Inez Catalina M.D.   On: 09/08/2021 23:51       Assessment & Plan:    Active Problems:   Type 2 diabetes mellitus (HCC)   PAF (paroxysmal atrial fibrillation) (HCC)   LVAD (left ventricular assist device) present (HCC)   CHF (congestive heart failure), NYHA class IV, chronic, systolic (HCC)   COPD (chronic obstructive pulmonary disease) (HCC)   COPD exacerbation Continue scheduled DuoNebs, as needed  albuterol Continue Solu-Medrol No sign of superimposed pneumonia-antibiotics not indicated at this time VBG does not show CO2 retention Continue to monitor Paroxysmal atrial fibrillation Continue amiodarone and warfarin Heart failure Not in exacerbation Continue Entresto Hyperlipidemia Continue statin LVAD in place Transfer to [employer] Elevated coronary already contacted Continue to monitor Diabetes mellitus type 2 Update hemoglobin A1c : Home hypoglycemics Sliding scale coverage Heart healthy, carb modified diet    DVT Prophylaxis-   SCDs and warfarin  AM Labs Ordered, also please review Full Orders  Family Communication: No family at bedside.  Code Status: Partial -everything except for CPR in the setting of LVAD  Admission status: Inpatient :The appropriate admission status for this patient is INPATIENT. Inpatient status is judged to be reasonable and necessary in order to provide the required intensity of service to ensure the patient's safety. The patient's presenting symptoms, physical exam findings, and initial radiographic and laboratory data in the context of their chronic comorbidities is felt to place them at high risk for further clinical deterioration. Furthermore, it is not anticipated that the patient will be medically stable for discharge from the hospital within 2 midnights of admission. The following factors support the admission status of inpatient.     The patient's presenting symptoms include dyspnea. The worrisome physical exam findings include significant dyspnea on exertion, tachypnea. The chronic co-morbidities include heart failure, LVAD, paroxysmal atrial fibrillation, diabetes mellitus.       * I certify that at the point of admission it is my clinical judgment that the patient will require inpatient hospital care spanning beyond 2 midnights from the point of admission due to high intensity of service, high risk for further deterioration and  high frequency of surveillance required.*  Time spent in minutes : [place]

## 2021-09-09 NOTE — Progress Notes (Signed)
OT Cancellation Note  Patient Details Name: ARAF CLUGSTON MRN: [id number] DOB: [birth year]   Cancelled Treatment:    Reason Eval/Treat Not Completed: OT screened, no needs identified, will sign off.  Per PT pt is at, or close to baseline.  Eber Jones., OTR/L Acute Rehabilitation Services Pager [phone] Office [phone]   Jeani Hawking M 09/09/2021, 12:37 PM

## 2021-09-09 NOTE — Progress Notes (Signed)
PROGRESS NOTE    Kevin Underwood  JQB:[id number] DOB: 01-13-1961 DOA: 09/08/2021 PCP: Kevin Peri, MD    Brief Narrative:  Kevin Underwood is a 60 year old male with past medical history significant for CAD, chronic systolic congestive heart failure s/p LVAD, COPD, essential hypertension, paroxysmal atrial fibrillation, type 2 diabetes mellitus, who presents to [facility] ED on 10/1 with progressive shortness of breath and wheezing.  Worse with exertion and improved with rest.  Now with difficulty ambulating.  Reports productive cough of clear sputum.  Patient had been utilizing his home nebulizer 3 times daily and his home inhalers without improvement.  Patient has not vaccinated for COVID-19.  No other complaints or concerns at this time.  Denies nausea/vomiting/diarrhea, no constipation, no chest pain, no dizziness, no abdominal pain.  Patient quit smoking 2 years prior, and has not been hospitalized for COPD since.  Denies EtOH use or illicit drug use.  In the ED, temperature 97.6 F, HR 89, RR 22, BP 104/91, SPO2 95% on room air.  VBG with pH 7.4, PCO2 42.7, PCO2 34.3.  Sodium 134, potassium 3.4, chloride 99, CO2 26, BUN 18, creatinine 0.89, glucose 319.  High sensitive troponin 11> 11.  WBC 9.6, hemoglobin 14.6, platelets 218.  INR 2.8.  Covid-19 PCR negative.  Influenza A/B PCR negative.  Chest x-ray with no acute cardiopulmonary disease process; LVAD noted in place.  While ambulating to the bathroom, patient with difficulty and almost collapsed.  Given patient's tachypnea and his poor functional status from acute COPD exacerbation, EDP consulted [facility] for further evaluation and management.  EDP discussed with LVAD coordinator and Dr. Shirlee Latch; who recommended transfer to [facility] if unable to discharge from ED.   Assessment & Plan:   Active Problems:   Type 2 diabetes mellitus (HCC)   PAF (paroxysmal atrial fibrillation) (HCC)   LVAD (left ventricular assist device) present  (HCC)   CHF (congestive heart failure), NYHA class IV, chronic, systolic (HCC)   COPD (chronic obstructive pulmonary disease) (HCC)   Acute COPD exacerbation Patient presenting to the ED with progressive shortness of breath associated with wheezing over the past week.  No improvement with home inhalers/neb treatments.  Patient was noted to be tachypneic with oxygen saturation in the low 90s.  Significantly dyspneic on ambulation.  Chest x-ray with no acute cardiopulmonary disease findings.  COVID-19 PCR/influenza A/B PCR negative.  Patient is afebrile without leukocytosis. --Sputum culture: Pending --Brovana nebs twice daily --Pulmicort neb twice daily --DuoNebs every 6 hours --Solu-Medrol 125 mg IV daily x 2 followed by prednisone 40mg  PO daily --Doxycycline 100 mg p.o. every 12 hours x7 days --Albuterol neb every 2 hours as needed shortness of breath/wheezing --Ambulatory O2 screening today  Hypokalemia Potassium 3.4 on admission.  Repleted. --Repeat electrolytes in the a.m.  Chronic systolic congestive heart failure s/p LVAD Essential hypertension --Entresto 24-26 mg twice daily --Continue statin  Paroxysmal atrial fibrillation --Amiodarone 200 mg p.o. daily --Continue warfarin, pharmacy consulted for dosing/monitoring --INR daily --Monitor on telemetry  Type 2 diabetes mellitus On metformin 1000 mg p.o. twice daily at home.  Last hemoglobin A1c 10.4 on 11/10/2020, not well controlled. --Updated hemoglobin A1c: Pending --Hold oral hypoglycemics while inpatient --Moderate SSI for coverage --CBGs qAC/HS  Peripheral neuropathy: Gabapentin 6 mg p.o. 3 times daily  Mood disorder --Olanzapine --Sertraline 50 mg p.o. daily  HLD: Crestor 40 mg p.o. daily  Insomnia: Trazodone 150 mg p.o. nightly  Weakness/fatigue/deconditioning: --PT/OT evaluation    DVT prophylaxis: SCDs Start:  09/09/21 0501 warfarin (COUMADIN) tablet 4 mg    Code Status: Partial Code Family  Communication: No family present at bedside this morning  Disposition Plan:  Level of care: Progressive Status is: Inpatient  Remains inpatient appropriate because:Unsafe d/c plan, IV treatments appropriate due to intensity of illness or inability to take PO, and Inpatient level of care appropriate due to severity of illness  Dispo: The patient is from: Home              Anticipated d/c is to: Home              Patient currently is not medically stable to d/c.   Difficult to place patient No    Consultants:  EDP discussed with LVAD coordinator/cardiology, Dr. Shirlee Latch  Procedures:  None  Antimicrobials:  Doxycycline 10/2>>   Subjective: Patient seen examined bedside, resting comfortably.  Currently receiving neb treatment from RT.  Awaiting transport to [facility].  States continues with generalized weakness and fatigue, but shortness of breath slightly improved this morning.  Continues with intermittent productive cough of clear sputum.  Requesting something to eat this morning.  No other questions or concerns at this time.  Patient denies headache, no dizziness, no chest pain, no palpitations, no abdominal pain, no fever/chills/night sweats, no nausea/vomiting/diarrhea.  No acute events overnight per nursing staff.  Objective: Vitals:   09/09/21 0430 09/09/21 0530 09/09/21 0600 09/09/21 0630  BP: 113/87 (!) 114/57 123/85 (!) 118/93  Pulse: 90 88 97 94  Resp: 20 15 18  (!) 21  Temp:      TempSrc:      SpO2: 94% 96% 94% 94%  Weight:      Height:        Intake/Output Summary (Last 24 hours) at 09/09/2021 1009 Last data filed at 09/09/2021 0836 Gross per 24 hour  Intake --  Output 850 ml  Net -850 ml   Filed Weights   09/08/21 2309  Weight: 90.7 kg    Examination:  General exam: Appears calm and comfortable, mildly dyspneic Respiratory system: Diffuse mid inspiratory/expiratory wheezing bilaterally, no crackles, no accessory muscle use, receiving neb  treatment Cardiovascular system: LVAD hum noted, no peripheral edema Gastrointestinal system: Abdomen is nondistended, soft and nontender. No organomegaly or masses felt. Normal bowel sounds heard. Central nervous system: Alert and oriented. No focal neurological deficits. Extremities: Symmetric 5 x 5 power. Skin: No rashes, lesions or ulcers Psychiatry: Judgement and insight appear normal. Mood & affect appropriate.     Data Reviewed: I have personally reviewed following labs and imaging studies  CBC: Recent Labs  Lab 09/08/21 2315 09/09/21 0313  WBC 9.6 8.2  NEUTROABS  --  7.3  HGB 14.6 14.2  HCT 43.8 42.0  MCV 86.2 86.1  PLT 218 215   Basic Metabolic Panel: Recent Labs  Lab 09/08/21 2315 09/09/21 0313  NA 134* 133*  K 3.4* 3.8  CL 99 100  CO2 26 24  GLUCOSE 319* 309*  BUN 18 17  CREATININE 0.89 0.75  CALCIUM 8.8* 8.6*  MG  --  2.1   GFR: Estimated Creatinine Clearance: 111.3 mL/min (by C-G formula based on SCr of 0.75 mg/dL). Liver Function Tests: Recent Labs  Lab 09/09/21 0313  AST 25  ALT 28  ALKPHOS 97  BILITOT 0.6  PROT 7.2  ALBUMIN 3.7   No results for input(s): LIPASE, AMYLASE in the last 168 hours. No results for input(s): AMMONIA in the last 168 hours. Coagulation Profile: Recent Labs  Lab  09/04/21 0000 09/08/21 2315  INR 2.6 2.8*   Cardiac Enzymes: No results for input(s): CKTOTAL, CKMB, CKMBINDEX, TROPONINI in the last 168 hours. BNP (last 3 results) No results for input(s): PROBNP in the last 8760 hours. HbA1C: No results for input(s): HGBA1C in the last 72 hours. CBG: Recent Labs  Lab 09/09/21 0743  GLUCAP 369*   Lipid Profile: No results for input(s): CHOL, HDL, LDLCALC, TRIG, CHOLHDL, LDLDIRECT in the last 72 hours. Thyroid Function Tests: No results for input(s): TSH, T4TOTAL, FREET4, T3FREE, THYROIDAB in the last 72 hours. Anemia Panel: No results for input(s): VITAMINB12, FOLATE, FERRITIN, TIBC, IRON, RETICCTPCT in the  last 72 hours. Sepsis Labs: No results for input(s): PROCALCITON, LATICACIDVEN in the last 168 hours.  Recent Results (from the past 240 hour(s))  Resp Panel by RT-PCR (Flu A&B, Covid) Nasopharyngeal Swab     Status: None   Collection Time: 09/08/21 11:59 PM   Specimen: Nasopharyngeal Swab; Nasopharyngeal(NP) swabs in vial transport medium  Result Value Ref Range Status   SARS Coronavirus 2 by RT PCR NEGATIVE NEGATIVE Final    Comment: (NOTE) SARS-CoV-2 target nucleic acids are NOT DETECTED.  The SARS-CoV-2 RNA is generally detectable in upper respiratory specimens during the acute phase of infection. The lowest concentration of SARS-CoV-2 viral copies this assay can detect is 138 copies/mL. A negative result does not preclude SARS-Cov-2 infection and should not be used as the sole basis for treatment or other patient management decisions. A negative result may occur with  improper specimen collection/handling, submission of specimen other than nasopharyngeal swab, presence of viral mutation(s) within the areas targeted by this assay, and inadequate number of viral copies(<138 copies/mL). A negative result must be combined with clinical observations, patient history, and epidemiological information. The expected result is Negative.  Fact Sheet for Patients:  BloggerCourse.com  Fact Sheet for Healthcare Providers:  SeriousBroker.it  This test is no t yet approved or cleared by the [place] FDA and  has been authorized for detection and/or diagnosis of SARS-CoV-2 by FDA under an Emergency Use Authorization (EUA). This EUA will remain  in effect (meaning this test can be used) for the duration of the COVID-19 declaration under Section 564(b)(1) of the Act, 21 U.S.C.section 360bbb-3(b)(1), unless the authorization is terminated  or revoked sooner.       Influenza A by PCR NEGATIVE NEGATIVE Final   Influenza B by PCR NEGATIVE  NEGATIVE Final    Comment: (NOTE) The Xpert Xpress SARS-CoV-2/FLU/RSV plus assay is intended as an aid in the diagnosis of influenza from Nasopharyngeal swab specimens and should not be used as a sole basis for treatment. Nasal washings and aspirates are unacceptable for Xpert Xpress SARS-CoV-2/FLU/RSV testing.  Fact Sheet for Patients: BloggerCourse.com  Fact Sheet for Healthcare Providers: SeriousBroker.it  This test is not yet approved or cleared by the [place] FDA and has been authorized for detection and/or diagnosis of SARS-CoV-2 by FDA under an Emergency Use Authorization (EUA). This EUA will remain in effect (meaning this test can be used) for the duration of the COVID-19 declaration under Section 564(b)(1) of the Act, 21 U.S.C. section 360bbb-3(b)(1), unless the authorization is terminated or revoked.  Performed at [facility], [address], [place], [place] [address]          Radiology Studies: [facility] [place] [facility] 1 View  Result Date: 09/08/2021 CLINICAL DATA:  Chest pain for 1 day with cough EXAM: PORTABLE CHEST 1 VIEW COMPARISON:  07/12/2021 FINDINGS: Cardiac shadow is stable.  Postsurgical changes are noted as well as an LVAD in place. Aortic calcifications are seen. Lungs are well aerated without focal infiltrate or effusion. No bony abnormality is noted. Old left clavicular fracture is seen. IMPRESSION: No acute abnormality noted.  LVAD is seen in place. Electronically Signed   By: Alcide Clever M.D.   On: 09/08/2021 23:51        Scheduled Meds:  amiodarone  200 mg Oral Daily   arformoterol  15 mcg Nebulization BID   budesonide (PULMICORT) nebulizer solution  0.25 mg Nebulization BID   gabapentin  600 mg Oral TID   insulin aspart  0-15 Units Subcutaneous TID WC   insulin aspart  0-5 Units Subcutaneous QHS   ipratropium-albuterol  3 mL Nebulization Q6H   methylPREDNISolone (SOLU-MEDROL) injection  125 mg  Intravenous Daily   Followed by   Melene Muller ON 09/10/2021] predniSONE  40 mg Oral Q breakfast   OLANZapine  5 mg Oral QHS   pantoprazole  40 mg Oral Daily   rosuvastatin  40 mg Oral Daily   sacubitril-valsartan  1 tablet Oral BID   sertraline  50 mg Oral Daily   traZODone  150 mg Oral QHS   warfarin  4 mg Oral ONCE-1600   Warfarin - Pharmacist Dosing Inpatient   Does not apply q1600   Continuous Infusions:   LOS: 0 days    Time spent: 39 minutes spent on chart review, discussion with nursing staff, consultants, updating family and interview/physical exam; more than 50% of that time was spent in counseling and/or coordination of care.    Alvira Philips [place], DO [facility] Available via Epic secure chat 7am-7pm After these hours, please refer to coverage provider listed on [email or web address] 09/09/2021, 10:09 AM

## 2021-09-09 NOTE — Plan of Care (Signed)
  Problem: Education: Goal: Knowledge of General Education information will improve Description: Including pain rating scale, medication(s)/side effects and non-pharmacologic comfort measures Outcome: Progressing   Problem: Health Behavior/Discharge Planning: Goal: Ability to manage health-related needs will improve Outcome: Progressing   Problem: Clinical Measurements: Goal: Ability to maintain clinical measurements within normal limits will improve Outcome: Progressing Goal: Will remain free from infection Outcome: Progressing Goal: Diagnostic test results will improve Outcome: Progressing Goal: Respiratory complications will improve Outcome: Progressing Goal: Cardiovascular complication will be avoided Outcome: Progressing   Problem: Activity: Goal: Risk for activity intolerance will decrease Outcome: Progressing   Problem: Nutrition: Goal: Adequate nutrition will be maintained Outcome: Progressing   Problem: Coping: Goal: Level of anxiety will decrease Outcome: Progressing   Problem: Elimination: Goal: Will not experience complications related to bowel motility Outcome: Progressing Goal: Will not experience complications related to urinary retention Outcome: Progressing   Problem: Pain Managment: Goal: General experience of comfort will improve Outcome: Progressing   Problem: Safety: Goal: Ability to remain free from injury will improve Outcome: Progressing   Problem: Skin Integrity: Goal: Risk for impaired skin integrity will decrease Outcome: Progressing   Problem: Education: Goal: Knowledge of disease or condition will improve Outcome: Progressing Goal: Knowledge of the prescribed therapeutic regimen will improve Outcome: Progressing Goal: Individualized Educational Video(s) Outcome: Progressing   Problem: Activity: Goal: Ability to tolerate increased activity will improve Outcome: Progressing Goal: Will verbalize the importance of balancing  activity with adequate rest periods Outcome: Progressing   Problem: Respiratory: Goal: Ability to maintain a clear airway will improve Outcome: Progressing Goal: Levels of oxygenation will improve Outcome: Progressing Goal: Ability to maintain adequate ventilation will improve Outcome: Progressing   Problem: Education: Goal: Patient will understand all VAD equipment and how it functions Outcome: Progressing Goal: Patient will be able to verbalize current INR target range and antiplatelet therapy for discharge home Outcome: Progressing   Problem: Cardiac: Goal: LVAD will function as expected and patient will experience no clinical alarms Outcome: Progressing

## 2021-09-09 NOTE — Progress Notes (Addendum)
ANTICOAGULATION CONSULT NOTE - Initial Consult  Pharmacy Consult for warfarin Indication: LVAD (HM3)  No Known Allergies  Patient Measurements: Height: 5\' 10"  (177.8 cm) Weight: 86.9 kg (191 lb 9.6 oz) IBW/kg (Calculated) : 73 Heparin Dosing Weight:   Vital Signs: Temp: 98.2 F (36.8 C) (10/02 1018) Temp Source: Oral (10/02 1018) BP: 131/86 (10/02 1018) Pulse Rate: 93 (10/02 1018)  Labs: Recent Labs    09/08/21 2315 09/09/21 0132 09/09/21 0313  HGB 14.6  --  14.2  HCT 43.8  --  42.0  PLT 218  --  215  LABPROT 29.4*  --   --   INR 2.8*  --   --   CREATININE 0.89  --  0.75  TROPONINIHS 11 11  --      Estimated Creatinine Clearance: 101.4 mL/min (by C-G formula based on SCr of 0.75 mg/dL).   Medical History: Past Medical History:  Diagnosis Date   Arthritis    CAD (coronary artery disease)    a. s/p CABG in 11/2019 with LIMA-LAD, SVG-OM1, SVG-PDA and SVG-D1   CHF (congestive heart failure) (HCC)    a. EF < 20% by echo in 11/2019   COPD (chronic obstructive pulmonary disease) ([place])    Essential hypertension    PAF (paroxysmal atrial fibrillation) (HCC)    Type 2 diabetes mellitus (HCC)     Medications:  Medications Prior to Admission  Medication Sig Dispense Refill Last Dose   albuterol (PROVENTIL) (2.5 MG/3ML) 0.083% nebulizer solution Take 3 mLs (2.5 mg total) by nebulization every 6 (six) hours as needed for wheezing or shortness of breath. 75 mL 6    amiodarone (PACERONE) 200 MG tablet Take 1 tablet (200 mg total) by mouth daily. Take 1 pill twice daily for 10 days and then resume a dose of 1 pill daily 90 tablet 3    blood glucose meter kit and supplies KIT Dispense based on patient and insurance preference. Use to check CBG's three times a day. (FOR ICD-9 250.00, 250.01). 1 each 0    colchicine 0.6 MG tablet Take 1 tablet (0.6 mg total) by mouth as needed. (Patient not taking: No sig reported) 60 tablet 3    COMBIVENT RESPIMAT 20-100 MCG/ACT AERS respimat  Inhale 1 puff into the lungs every 6 (six) hours as needed. 4 g 1    cyclobenzaprine (FLEXERIL) 5 MG tablet Take 1-2 tablets (5-10 mg total) by mouth 2 (two) times daily as needed for muscle spasms. (Patient not taking: Reported on 07/12/2021) 45 tablet 3    dapagliflozin propanediol (FARXIGA) 5 MG TABS tablet Take 1 tablet (5 mg total) by mouth daily before breakfast. (Patient taking differently: Take 10 mg by mouth daily before breakfast.) 30 tablet 3    doxycycline (VIBRAMYCIN) 100 MG capsule Take 1 capsule (100 mg total) by mouth 2 (two) times daily. 14 capsule 0    gabapentin (NEURONTIN) 600 MG tablet Take 1 tablet (600 mg total) by mouth 3 (three) times daily. 90 tablet 0    metFORMIN (GLUCOPHAGE) 1000 MG tablet TAKE 1 TABLET (1,000 MG TOTAL) BY MOUTH IN THE MORNING AND AT BEDTIME. 180 tablet 3    mometasone-formoterol (DULERA) 200-5 MCG/ACT AERO Inhale 2 puffs into the lungs in the morning and at bedtime. 1 each 2    Multiple Vitamin (MULTIVITAMIN WITH MINERALS) TABS tablet Take 1 tablet by mouth daily.      OLANZapine (ZYPREXA) 5 MG tablet Take 5 mg by mouth at bedtime.  pantoprazole (PROTONIX) 40 MG tablet Take 1 tablet (40 mg total) by mouth daily. 30 tablet 6    potassium chloride SA (KLOR-CON) 20 MEQ tablet Take 2 tablets (40 mEq total) by mouth daily. (Patient not taking: No sig reported) 60 tablet 3    rosuvastatin (CRESTOR) 40 MG tablet Take 1 tablet (40 mg total) by mouth daily. 30 tablet 3    sacubitril-valsartan (ENTRESTO) 24-26 MG Take 1 tablet by mouth 2 (two) times daily. 60 tablet 6    sertraline (ZOLOFT) 50 MG tablet TAKE 1 TABLET BY MOUTH EVERY DAY 90 tablet 3    Tetrahydrozoline HCl (REDNESS RELIEVER EYE DROPS OP) Apply 2 drops to eye daily as needed.      traZODone (DESYREL) 150 MG tablet TAKE 1 TABLET BY MOUTH AT BEDTIME. 90 tablet 3    warfarin (COUMADIN) 4 MG tablet Take 6 mg (1.5 tabs) on Mon/Wed/Fri and 4 mg (1 tab) all other days or as directed by HF Clinic 60 tablet  11     Assessment: Pharmacy consulted to dose warfarin in patient with LVAD.  Patient's INR on admission is 2.8, which is supratheraputic given his goal of 2-2.5. Patient admitted with COPD exacerbation and started on doxycycline 100 mg q12h on 10/2. The doxycycline can additionally increase INR.    Home dose listed as 6 mg on MWF and 4 mg ROW  per anti-coag clinic note. Last dose PTA was 10/1 at 1800.  Will give 2 mg dose tonight given DDI and level above goal.    Goal of Therapy:  INR 2-2.5 Monitor platelets by anticoagulation protocol: Yes   Plan:  Warfarin 2 mg x 1 dose. Monitor daily INR and s/s of bleeding.  Cathrine Muster, PharmD PGY2 Cardiology Pharmacy Resident Phone: [phone] 09/09/2021  12:51 PM  Please check [email or web address] for unit-specific pharmacy phone numbers.  Cina Klumpp A Ardeth Repetto 09/09/2021,12:49 PM

## 2021-09-09 NOTE — ED Notes (Signed)
Pt ambulatory in hallway, pt very short of breath, 02 sat = 90% while ambulating.

## 2021-09-09 NOTE — ED Notes (Signed)
Dr. Pilar Plate in to see pt.

## 2021-09-09 NOTE — Progress Notes (Signed)
ANTICOAGULATION CONSULT NOTE - Initial Consult  Pharmacy Consult for warfarin Indication: atrial fibrillation  No Known Allergies  Patient Measurements: Height: 5\' 10"  (177.8 cm) Weight: 90.7 kg (200 lb) IBW/kg (Calculated) : 73 Heparin Dosing Weight:   Vital Signs: Temp: 97.6 F (36.4 C) (10/01 2312) Temp Source: Oral (10/01 2312) BP: 118/93 (10/02 0630) Pulse Rate: 94 (10/02 0630)  Labs: Recent Labs    09/08/21 2315 09/09/21 0132 09/09/21 0313  HGB 14.6  --  14.2  HCT 43.8  --  42.0  PLT 218  --  215  LABPROT 29.4*  --   --   INR 2.8*  --   --   CREATININE 0.89  --  0.75  TROPONINIHS 11 11  --     Estimated Creatinine Clearance: 111.3 mL/min (by C-G formula based on SCr of 0.75 mg/dL).   Medical History: Past Medical History:  Diagnosis Date   Arthritis    CAD (coronary artery disease)    a. s/p CABG in 11/2019 with LIMA-LAD, SVG-OM1, SVG-PDA and SVG-D1   CHF (congestive heart failure) (HCC)    a. EF < 20% by echo in 11/2019   COPD (chronic obstructive pulmonary disease) (HCC)    Essential hypertension    PAF (paroxysmal atrial fibrillation) (HCC)    Type 2 diabetes mellitus (HCC)     Medications:  (Not in a hospital admission)   Assessment: Pharmacy consulted to dose warfarin in patient with atrial fibrillation.  Patient's INR on admission is 2.8 with last dose pending med rec completion. Home dose listed as 6 mg on MWF and 4 mg ROW  per anti-coag clinic note.   Goal of Therapy:  INR 2-3 Monitor platelets by anticoagulation protocol: Yes   Plan:  Warfarin 4 mg x 1 dose. Monitor daily INR and s/s of bleeding.  12/2019 Kevin Underwood 09/09/2021,9:45 AM

## 2021-09-09 NOTE — Evaluation (Signed)
Physical Therapy Evaluation and Discharge Patient Details Name: Kevin Underwood MRN: [id number] DOB: [birth year] Today's Date: 09/09/2021  History of Present Illness  60 y.o. male presented to the ED 09/08/21 with a chief complaint of dyspnea. +COPD exacerbation PMH coronary artery disease, CHF, COPD, essential hypertension, paroxysmal atrial fibrillation, and type 2 diabetes mellitus, LVAD, and more  Clinical Impression   Patient evaluated by Physical Therapy with no further acute PT needs identified. Patient ambulated on room air with sats 90-92% and up to 96% at rest.  PT is signing off. Thank you for this referral.        Recommendations for follow up therapy are one component of a multi-disciplinary discharge planning process, led by the attending physician.  Recommendations may be updated based on patient status, additional functional criteria and insurance authorization.  Follow Up Recommendations No PT follow up    Equipment Recommendations  None recommended by PT    Recommendations for Other Services       Precautions / Restrictions Precautions Precautions: Other (comment) Precaution Comments: previous LVAD      Mobility  Bed Mobility Overal bed mobility: Independent                  Transfers Overall transfer level: Independent               General transfer comment: as pt stood he even turned 360 to untangle himself from pulse ox cord  Ambulation/Gait Ambulation/Gait assistance: Independent Gait Distance (Feet): 200 Feet Assistive device: None Gait Pattern/deviations: WFL(Within Functional Limits);Step-through pattern;Antalgic (slight limp from prior foot injuries per pt) Gait velocity: WNL Gait velocity interpretation: >2.62 ft/sec, indicative of community ambulatory General Gait Details: on RA with sats >90% throughout  Stairs            Wheelchair Mobility    Modified Rankin (Stroke Patients Only)       Balance Overall balance  assessment: Independent                                           Pertinent Vitals/Pain Pain Assessment: No/denies pain    Home Living Family/patient expects to be discharged to:: Private residence Living Arrangements: Spouse/significant other Available Help at Discharge: Family;Available 24 hours/day Type of Home: Apartment Home Access: Stairs to enter Entrance Stairs-Rails: Right Entrance Stairs-Number of Steps: 15 Home Layout: One level Home Equipment: Emergency planning/management officer - 2 wheels Additional Comments: did not use equipment before admit    Prior Function Level of Independence: Independent               Hand Dominance   Dominant Hand: Right    Extremity/Trunk Assessment   Upper Extremity Assessment Upper Extremity Assessment: Overall WFL for tasks assessed    Lower Extremity Assessment Lower Extremity Assessment: Overall WFL for tasks assessed    Cervical / Trunk Assessment Cervical / Trunk Assessment: Normal  Communication   Communication: No difficulties  Cognition Arousal/Alertness: Awake/alert Behavior During Therapy: WFL for tasks assessed/performed Overall Cognitive Status: Within Functional Limits for tasks assessed                                        General Comments      Exercises     Assessment/Plan    PT Assessment Patent does  not need any further PT services  PT Problem List         PT Treatment Interventions      PT Goals (Current goals can be found in the Care Plan section)  Acute Rehab PT Goals Patient Stated Goal: go home PT Goal Formulation: All assessment and education complete, DC therapy    Frequency     Barriers to discharge        Co-evaluation               AM-PAC PT "6 Clicks" Mobility  Outcome Measure Help needed turning from your back to your side while in a flat bed without using bedrails?: None Help needed moving from lying on your back to sitting on the side of  a flat bed without using bedrails?: None Help needed moving to and from a bed to a chair (including a wheelchair)?: None Help needed standing up from a chair using your arms (e.g., wheelchair or bedside chair)?: None Help needed to walk in hospital room?: None Help needed climbing 3-5 steps with a railing? : None 6 Click Score: 24    End of Session Equipment Utilized During Treatment: Other (comment) (pt switched to LVAD batteries independently) Activity Tolerance: Patient tolerated treatment well Patient left: in chair;with call bell/phone within reach (a&O and Rn agreed no need for chair alarm) Nurse Communication: Mobility status;Other (comment) (pt remained on batteries at end of session) PT Visit Diagnosis: Difficulty in walking, not elsewhere classified (R26.2)    Time: 1210-1230 PT Time Calculation (min) (ACUTE ONLY): 20 min   Charges:   PT Evaluation $PT Eval Low Complexity: 1 Low           Jerolyn Center, PT Pager [phone]   Zena Amos 09/09/2021, 12:44 PM

## 2021-09-09 NOTE — Consult Note (Signed)
Advanced Heart Failure Team Consult Note   Primary Physician: Kirstie Peri, MD PCP-Cardiologist:  Nona Dell, MD  Reason for Consultation: LVAD present  HPI:    Kevin Underwood is seen today for evaluation of LVAD at the request of Dr Sharl Ma.   60 y.o. 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 type 2 diabetes mellitus, COPD, and hypertension. Now s/p Heartmate 3 LVAD.    Patient had echo in 10/21 with EF 20-25%.  He was admitted in 12/21 with cardiogenic shock.  Cath showed patent grafts and low cardiac output.  Suspect mixed ischemic/nonischemic cardiomyopathy, prior heavy ETOH though no ETOH or smoking since 12/20 CABG.  He required stabilization with Impella 5.5 and ultimately had placement of Heartmate 3 LVAD.  Paroxysmal atrial fibrillation noted post-op, but generally stable course and discharged home.     Patient has history of very frequent PI events with some suction associated with lightheadedness.  Entresto was cut back with improved MAP.  Additionally, he has had very frequent symptomatic PVCs.  We restarted amiodarone and these have improved significantly.   For the last week, patient has had severe wheezing and increased exertional dyspnea.  He has been coughing and gets lightheaded with coughing.  He came [facility] ER last night and was given Solumedrol and nebs for COPD exacerbation. CXR showed no PNA.  He was admitted (cardiology not initially aware that he was admitted).  He is now getting steroids, nebs, and doxycycline. He is feeling better and wheezing is improved.  INR therapeutic.  He is a prior smoker but has not smoked for about 2 years now.    Denies LVAD alarms.  Reports taking Coumadin as prescribed and adherence to anticoagulation based dietary restrictions.  Denies bright red blood per  rectum or melena, no dark urine or hematuria.    VAD interrogation: Speed: 5600 Flow: 4.4 Power: 4.2 w    PI: 3.3 Many PI events   Review of Systems: All systems reviewed and negative except as per HPI.   Home Medications Prior to Admission medications   Medication Sig Start Date End Date Taking? Authorizing Provider  albuterol (PROVENTIL) (2.5 MG/3ML) 0.083% nebulizer solution Take 3 mLs (2.5 mg total) by nebulization every 6 (six) hours as needed for wheezing or shortness of breath. 08/07/21   Laurey Morale, MD  amiodarone (PACERONE) 200 MG tablet Take 1 tablet (200 mg total) by mouth daily. Take 1 pill twice daily for 10 days and then resume a dose of 1 pill daily 07/12/21   Laurey Morale, MD  blood glucose meter kit and supplies KIT Dispense based on patient and insurance preference. Use to check CBG's three times a day. (FOR ICD-9 250.00, 250.01). 09/25/20   Vassie Loll, MD  colchicine 0.6 MG tablet Take 1 tablet (0.6 mg total) by mouth as needed. Patient not taking: No sig reported 12/04/20   Laurey Morale, MD  COMBIVENT RESPIMAT 20-100 MCG/ACT AERS respimat Inhale 1 puff into the lungs every 6 (six) hours as needed. 12/08/19   Gold, Glenice Laine, PA-C  cyclobenzaprine (FLEXERIL) 5 MG tablet Take 1-2 tablets (5-10 mg total) by mouth 2 (two) times daily as needed for muscle spasms. Patient not taking: Reported on 07/12/2021 01/08/21   Laurey Morale, MD  dapagliflozin propanediol (FARXIGA) 5 MG TABS tablet Take  1 tablet (5 mg total) by mouth daily before breakfast. Patient taking differently: Take 10 mg by mouth daily before breakfast. 09/25/20   Barton Dubois, MD  doxycycline (VIBRAMYCIN) 100 MG capsule Take 1 capsule (100 mg total) by mouth 2 (two) times daily. 08/16/21   Larey Dresser, MD  gabapentin (NEURONTIN) 600 MG tablet Take 1 tablet (600 mg total) by mouth 3 (three) times daily. 09/25/20   Barton Dubois, MD  metFORMIN (GLUCOPHAGE) 1000 MG tablet TAKE 1 TABLET (1,000 MG  TOTAL) BY MOUTH IN THE MORNING AND AT BEDTIME. 06/14/21   Larey Dresser, MD  mometasone-formoterol [employer] [facility]) 200-5 MCG/ACT AERO Inhale 2 puffs into the lungs in the morning and at bedtime. 09/25/20   Barton Dubois, MD  Multiple Vitamin (MULTIVITAMIN WITH MINERALS) TABS tablet Take 1 tablet by mouth daily. 12/09/19   Gold, Wayne E, PA-C  OLANZapine (ZYPREXA) 5 MG tablet Take 5 mg by mouth at bedtime. 10/25/20   [provider]  pantoprazole (PROTONIX) 40 MG tablet Take 1 tablet (40 mg total) by mouth daily. 08/01/21   Larey Dresser, MD  potassium chloride SA (KLOR-CON) 20 MEQ tablet Take 2 tablets (40 mEq total) by mouth daily. Patient not taking: No sig reported 11/28/20   Lyda Jester M, PA-C  rosuvastatin (CRESTOR) 40 MG tablet Take 1 tablet (40 mg total) by mouth daily. 09/25/20   Barton Dubois, MD  sacubitril-valsartan (ENTRESTO) 24-26 MG Take 1 tablet by mouth 2 (two) times daily. 07/12/21   Larey Dresser, MD  sertraline (ZOLOFT) 50 MG tablet TAKE 1 TABLET BY MOUTH EVERY DAY 05/28/21   Larey Dresser, MD  Tetrahydrozoline HCl (REDNESS RELIEVER EYE DROPS OP) Apply 2 drops to eye daily as needed.    [provider]  traZODone (DESYREL) 150 MG tablet TAKE 1 TABLET BY MOUTH AT BEDTIME. 01/22/21   Larey Dresser, MD  warfarin (COUMADIN) 4 MG tablet Take 6 mg (1.5 tabs) on Mon/Wed/Fri and 4 mg (1 tab) all other days or as directed by HF Clinic 07/10/21   Orma Render, RPH-CPP  enoxaparin (LOVENOX) 40 MG/0.4ML injection INJECT 0.4 MLS (40 MG TOTAL) INTO THE SKIN EVERY 12 (TWELVE) HOURS. 03/14/21 07/26/21  Larey Dresser, MD    Past Medical History: Past Medical History:  Diagnosis Date   Arthritis    CAD (coronary artery disease)    a. s/p CABG in 11/2019 with LIMA-LAD, SVG-OM1, SVG-PDA and SVG-D1   CHF (congestive heart failure) (HCC)    a. EF < 20% by echo in 11/2019   COPD (chronic obstructive pulmonary disease) ([place])    Essential hypertension    PAF  (paroxysmal atrial fibrillation) (HCC)    Type 2 diabetes mellitus ([place])     Past Surgical History: Past Surgical History:  Procedure Laterality Date   BACK SURGERY     CLIPPING OF ATRIAL APPENDAGE N/A 11/26/2019   Procedure: Clipping Of Atrial Appendage using AtriCure 40 Clip;  Surgeon: Wonda Olds, MD;  Location: [place];  Service: Open Heart Surgery;  Laterality: N/A;   CORONARY ARTERY BYPASS GRAFT N/A 11/26/2019   Procedure: CORONARY ARTERY BYPASS GRAFTING (CABG) using endoscopic greater saphenous vein harvest: svc to OM; svc to Diag; svc to PD; and LIMA to LAD.;  Surgeon: Wonda Olds, MD;  Location: [place];  Service: Open Heart Surgery;  Laterality: N/A;   FOOT SURGERY     HAND SURGERY     INSERTION OF IMPLANTABLE LEFT VENTRICULAR ASSIST DEVICE N/A  11/17/2020   Procedure: INSERTION OF IMPLANTABLE LEFT VENTRICULAR ASSIST DEVICE - HM3;  Surgeon: [name], MD;  Location: [facility] OR;  Service: Open Heart Surgery;  Laterality: N/A;   INTRAOPERATIVE TRANSESOPHAGEAL ECHOCARDIOGRAM  12/04/2019   Procedure: Intraoperative Transesophageal Echocardiogram;  Surgeon: [name], MD;  Location: [facility] OR;  Service: Open Heart Surgery;;   MAZE N/A 11/26/2019   Procedure: MAZE using Bilateral Pulmonary Vein isolation.;  Surgeon: [name], MD;  Location: [facility];  Service: Open Heart Surgery;  Laterality: N/A;   MULTIPLE EXTRACTIONS WITH ALVEOLOPLASTY N/A 11/16/2020   Procedure: MULTIPLE EXTRACTION WITH ALVEOLOPLASTY;  Surgeon: Sharman Cheek, DMD;  Location: [facility] OR;  Service: Dentistry;  Laterality: N/A;   PLACEMENT OF IMPELLA LEFT VENTRICULAR ASSIST DEVICE N/A 11/24/2019   Procedure: PLACEMENT OF IMPELLA 5.5 LEFT VENTRICULAR ASSIST DEVICE;  Surgeon: [name], MD;  Location: [facility];  Service: Open Heart Surgery;  Laterality: N/A;   PLACEMENT OF IMPELLA LEFT VENTRICULAR ASSIST DEVICE Left 11/10/2020   Procedure: PLACEMENT OF IMPELLA 5.5 LEFT VENTRICULAR ASSIST DEVICE VIA   LEFT AVILLARY ARTERY;  Surgeon: [name], MD;  Location: [facility];  Service: Open Heart Surgery;  Laterality: Left;   REMOVAL OF IMPELLA LEFT VENTRICULAR ASSIST DEVICE Right 12/04/2019   Procedure: REMOVAL OF IMPELLA LEFT VENTRICULAR ASSIST DEVICE, right axilla;  Surgeon: [name], MD;  Location: [facility] OR;  Service: Open Heart Surgery;  Laterality: Right;   RIGHT/LEFT HEART CATH AND CORONARY ANGIOGRAPHY N/A 11/24/2019   Procedure: RIGHT/LEFT HEART CATH AND CORONARY ANGIOGRAPHY;  Surgeon: Laurey Morale, MD;  [facility] INVASIVE CV LAB;  Service: Cardiovascular;  Laterality: N/A;   RIGHT/LEFT HEART CATH AND CORONARY/GRAFT ANGIOGRAPHY N/A 11/09/2020   Procedure: RIGHT/LEFT HEART CATH AND CORONARY/GRAFT ANGIOGRAPHY;  Surgeon: [name], MD;  Location: [facility] INVASIVE CV LAB;  Service: Cardiovascular;  Laterality: N/A;   TEE WITHOUT CARDIOVERSION N/A 11/24/2019   Procedure: TRANSESOPHAGEAL ECHOCARDIOGRAM (TEE);  Surgeon: [name], MD;  Location: [facility] OR;  Service: Open Heart Surgery;  Laterality: N/A;   TEE WITHOUT CARDIOVERSION N/A 11/26/2019   Procedure: TRANSESOPHAGEAL ECHOCARDIOGRAM (TEE);  Surgeon: [name], MD;  Location: [facility] OR;  Service: Open Heart Surgery;  Laterality: N/A;   TEE WITHOUT CARDIOVERSION N/A 11/10/2020   Procedure: TRANSESOPHAGEAL ECHOCARDIOGRAM (TEE);  Surgeon: [name], MD;  Location: [facility] OR;  Service: Open Heart Surgery;  Laterality: N/A;   TEE WITHOUT CARDIOVERSION N/A 11/17/2020   Procedure: TRANSESOPHAGEAL ECHOCARDIOGRAM (TEE);  Surgeon: [name], MD;  Location: [facility] OR;  Service: Open Heart Surgery;  Laterality: N/A;    Family History: Family History  Problem Relation Age of Onset   Arthritis Other    Lung disease Other    Asthma Other    Diabetes Other    Heart disease Sister        Tumor?    Social History: Social History   Socioeconomic History   Marital status: Married    Spouse name: Not on file   Number  of children: Not on file   Years of education: 12   Highest education level: Not on file  Occupational History   Not on file  Tobacco Use   Smoking status: Former    Packs/day: 1.00    Years: 30.00    Pack years: 30.00    Types: Cigarettes    Quit date: 12/20/2019    Years since quitting: 1.7   Smokeless tobacco: Never  Vaping Use  Vaping Use: Never used  Substance and Sexual Activity   Alcohol use: Yes    Comment: Prior history of excessive intake   Drug use: No   Sexual activity: Not on file  Other Topics Concern   Not on file  Social History Narrative   Not on file   Social Determinants of Health   Financial Resource Strain: Low Risk    Difficulty of Paying Living Expenses: Not very hard  Food Insecurity: No Food Insecurity   Worried About Running Out of Food in the Last Year: Never true   Ran Out of Food in the Last Year: Never true  Transportation Needs: Unmet Transportation Needs   Lack of Transportation (Medical): Yes   Lack of Transportation (Non-Medical): No  Physical Activity: Inactive   Days of Exercise per Week: 0 days   Minutes of Exercise per Session: 0 min  Stress: Not on file  Social Connections: Not on file    Allergies:  No Known Allergies  Objective:    Vital Signs:   Temp:  [97.6 F (36.4 C)-98.2 F (36.8 C)] 98 F (36.7 C) (10/02 1716) Pulse Rate:  [85-147] 96 (10/02 1716) Resp:  [15-25] 20 (10/02 1716) BP: (76-137)/(57-100) 118/89 (10/02 1716) SpO2:  [91 %-97 %] 91 % (10/02 1716) Weight:  [86.9 kg-90.7 kg] 86.9 kg (10/02 1028)    Weight change: Filed Weights   09/08/21 2309 09/09/21 1028  Weight: 90.7 kg 86.9 kg    Intake/Output:   Intake/Output Summary (Last 24 hours) at 09/09/2021 1813 Last data filed at 09/09/2021 0836 Gross per 24 hour  Intake --  Output 850 ml  Net -850 ml      Physical Exam    General: Well appearing this am. NAD.  HEENT: Normal. Neck: Supple, JVP 7-8 cm. Carotids OK.  Cardiac:  Mechanical  heart sounds with LVAD hum present.  Lungs:  Diffuse wheezing bilaterally.   Abdomen:  NT, ND, no HSM. No bruits or masses. +BS  LVAD exit site: Well-healed and incorporated. Dressing dry and intact. No erythema or drainage. Stabilization device present and accurately applied. Driveline dressing changed daily per sterile technique. Extremities:  Warm and dry. No cyanosis, clubbing, rash, or edema.  Neuro:  Alert & oriented x 3. Cranial nerves grossly intact. Moves all 4 extremities w/o difficulty. Affect pleasant    Telemetry   NSR 90s (personally reviewed).   EKG    NSR, old anterior MI, PVC present (personally reviewed).   Labs   Basic Metabolic Panel: Recent Labs  Lab 09/08/21 2315 09/09/21 0313  NA 134* 133*  K 3.4* 3.8  CL 99 100  CO2 26 24  GLUCOSE 319* 309*  BUN 18 17  CREATININE 0.89 0.75  CALCIUM 8.8* 8.6*  MG  --  2.1    Liver Function Tests: Recent Labs  Lab 09/09/21 0313  AST 25  ALT 28  ALKPHOS 97  BILITOT 0.6  PROT 7.2  ALBUMIN 3.7   No results for input(s): LIPASE, AMYLASE in the last 168 hours. No results for input(s): AMMONIA in the last 168 hours.  CBC: Recent Labs  Lab 09/08/21 2315 09/09/21 0313  WBC 9.6 8.2  NEUTROABS  --  7.3  HGB 14.6 14.2  HCT 43.8 42.0  MCV 86.2 86.1  PLT 218 215    Cardiac Enzymes: No results for input(s): CKTOTAL, CKMB, CKMBINDEX, TROPONINI in the last 168 hours.  BNP: BNP (last 3 results) Recent Labs    11/08/20 1344 11/18/20 0327 11/24/20  0045  BNP 910.0* 392.0* 252.0*    ProBNP (last 3 results) No results for input(s): PROBNP in the last 8760 hours.   CBG: Recent Labs  Lab 09/09/21 0743 09/09/21 1112 09/09/21 1652  GLUCAP 369* 359* 247*    Coagulation Studies: Recent Labs    09/08/21 2315  LABPROT 29.4*  INR 2.8*     Imaging   DG Chest Port 1 View  Result Date: 09/08/2021 CLINICAL DATA:  Chest pain for 1 day with cough EXAM: PORTABLE CHEST 1 VIEW COMPARISON:  07/12/2021  FINDINGS: Cardiac shadow is stable. Postsurgical changes are noted as well as an LVAD in place. Aortic calcifications are seen. Lungs are well aerated without focal infiltrate or effusion. No bony abnormality is noted. Old left clavicular fracture is seen. IMPRESSION: No acute abnormality noted.  LVAD is seen in place. Electronically Signed   By: Inez Catalina M.D.   On: 09/08/2021 23:51     Medications:     Current Medications:  amiodarone  200 mg Oral Daily   arformoterol  15 mcg Nebulization BID   budesonide (PULMICORT) nebulizer solution  0.25 mg Nebulization BID   dapagliflozin propanediol  10 mg Oral Daily   doxycycline  100 mg Oral Q12H   gabapentin  600 mg Oral TID   insulin aspart  0-15 Units Subcutaneous TID WC   insulin aspart  0-5 Units Subcutaneous QHS   ipratropium-albuterol  3 mL Nebulization Q6H   OLANZapine  5 mg Oral QHS   pantoprazole  40 mg Oral Daily   [START ON 09/10/2021] predniSONE  40 mg Oral Q breakfast   rosuvastatin  40 mg Oral Daily   sacubitril-valsartan  1 tablet Oral BID   sertraline  50 mg Oral Daily   traZODone  150 mg Oral QHS   Warfarin - Pharmacist Dosing Inpatient   Does not apply q1600    Infusions:   Assessment/Plan   1. Chronic systolic CHF: Echo 54/56 with EF 20-25%, mildly decreased RV function. LHC/RHC in 12/21 with patent grafts, low output. Suspect mixed ischemic/nonischemic cardiomyopathy (prior heavy ETOH and drugs as well as CAD).  No ETOH, drugs, smoking since CABG in 12/20. Admitted with cardiogenic shock in 12/21, had placement of Impella 5.5 initially, now s/p Heartmate 3 LVAD on 11/17/20.  Not volume overloaded on exam. Device interrogation today shows many PI events.  He reports dizziness with coughing, coughing and poor po intake with COPD exacerbation may be triggering the PI events. MAP is not low.  He is not volume overloaded on exam.   - Encourage po hydration.  - He does not need a diuretic.  - Continue Entresto 24/26 bid.    - Continue dapagliflozin 10 mg daily.  - Continue Coumadin, INR goal 2-2.5.  2. COPD exacerbation: Diffuse wheezing, improved with steroids and nebs.  No PNA on CXR. Symptoms are improving.  - Continue steroids, nebs, doxycycline per Triad.  3. CAD: S/p CABG 12/20.  LHC pre-VAD with patent grafts, no target for intervention. - Continue statin. 4. Atrial fibrillation: Paroxysmal.  S/p Maze and LA appendage clip with CABG in 12/20.  In NSR today.  5. DM: SSI.  6. Prior ETOH, drugs: None x  >1 year.  7. Gout: Quiescent.  8. PACs/PVCs: Minimal now that he is back on amiodarone.  - Follow LFT and TSH with amiodarone, will also need regular eye exam.   Length of Stay: 0  Loralie Champagne, MD  09/09/2021, 6:13 PM  Advanced Heart Failure Team  Pager [phone] (M-F; [place])  Please contact [facility] for night-coverage after hours (4p -7a ) and weekends on [email or web address]

## 2021-09-10 ENCOUNTER — Other Ambulatory Visit (HOSPITAL_COMMUNITY): Payer: Self-pay

## 2021-09-10 DIAGNOSIS — J441 Chronic obstructive pulmonary disease with (acute) exacerbation: Principal | ICD-10-CM

## 2021-09-10 LAB — CBC
HCT: 39.4 % (ref 39.0–52.0)
Hemoglobin: 13.5 g/dL (ref 13.0–17.0)
MCH: 28.5 pg (ref 26.0–34.0)
MCHC: 34.3 g/dL (ref 30.0–36.0)
MCV: 83.3 fL (ref 80.0–100.0)
Platelets: 242 10*3/uL (ref 150–400)
RBC: 4.73 MIL/uL (ref 4.22–5.81)
RDW: 13.5 % (ref 11.5–15.5)
WBC: 15.4 10*3/uL — ABNORMAL HIGH (ref 4.0–10.5)
nRBC: 0 % (ref 0.0–0.2)

## 2021-09-10 LAB — GLUCOSE, CAPILLARY
Glucose-Capillary: 181 mg/dL — ABNORMAL HIGH (ref 70–99)
Glucose-Capillary: 229 mg/dL — ABNORMAL HIGH (ref 70–99)
Glucose-Capillary: 419 mg/dL — ABNORMAL HIGH (ref 70–99)
Glucose-Capillary: 437 mg/dL — ABNORMAL HIGH (ref 70–99)
Glucose-Capillary: 323 mg/dL — ABNORMAL HIGH (ref 70–99)

## 2021-09-10 LAB — MAGNESIUM: Magnesium: 2.2 mg/dL (ref 1.7–2.4)

## 2021-09-10 LAB — BASIC METABOLIC PANEL
Anion gap: 11 (ref 5–15)
BUN: 24 mg/dL — ABNORMAL HIGH (ref 6–20)
CO2: 21 mmol/L — ABNORMAL LOW (ref 22–32)
Calcium: 9.1 mg/dL (ref 8.9–10.3)
Chloride: 101 mmol/L (ref 98–111)
Creatinine, Ser: 0.93 mg/dL (ref 0.61–1.24)
GFR, Estimated: >60 mL/min (ref >60)
Glucose, Bld: 230 mg/dL — ABNORMAL HIGH (ref 70–99)
Potassium: 3.9 mmol/L (ref 3.5–5.1)
Sodium: 133 mmol/L — ABNORMAL LOW (ref 135–145)

## 2021-09-10 LAB — LACTATE DEHYDROGENASE: LDH: 181 U/L (ref 98–192)

## 2021-09-10 LAB — PROTIME-INR
INR: 2.8 — ABNORMAL HIGH (ref 0.8–1.2)
Prothrombin Time: 29.5 seconds — ABNORMAL HIGH (ref 11.4–15.2)

## 2021-09-10 MED ORDER — PREDNISONE 20 MG PO TABS
40.0000 mg | ORAL_TABLET | Freq: Every day | ORAL | Status: DC
  Administered 2021-09-11: 40 mg via ORAL
  Filled 2021-09-10: qty 2

## 2021-09-10 MED ORDER — REVEFENACIN 175 MCG/3ML IN SOLN
175.0000 ug | Freq: Every day | RESPIRATORY_TRACT | Status: DC
  Filled 2021-09-10: qty 3

## 2021-09-10 MED ORDER — IPRATROPIUM-ALBUTEROL 0.5-2.5 (3) MG/3ML IN SOLN: 3.0000 mL | Freq: Four times a day (QID) | RESPIRATORY_TRACT | Status: DC

## 2021-09-10 MED ORDER — REVEFENACIN 175 MCG/3ML IN SOLN
175.0000 ug | Freq: Every day | RESPIRATORY_TRACT | Status: DC
  Administered 2021-09-11: 175 ug via RESPIRATORY_TRACT
  Filled 2021-09-10: qty 3

## 2021-09-10 MED ORDER — INSULIN ASPART 100 UNIT/ML IJ SOLN
0.0000 [IU] | Freq: Three times a day (TID) | INTRAMUSCULAR | Status: DC
Start: 2021-09-10 — End: 2021-09-11
  Administered 2021-09-10: 20 [IU] via SUBCUTANEOUS
  Administered 2021-09-11: 11 [IU] via SUBCUTANEOUS

## 2021-09-10 MED ORDER — WARFARIN SODIUM 1 MG PO TABS
1.0000 mg | ORAL_TABLET | Freq: Once | ORAL | Status: AC
  Administered 2021-09-10: 1 mg via ORAL
  Filled 2021-09-10: qty 1

## 2021-09-10 MED ORDER — LORATADINE 10 MG PO TABS
10.0000 mg | ORAL_TABLET | Freq: Every day | ORAL | Status: DC
  Administered 2021-09-10 – 2021-09-11 (×2): 10 mg via ORAL
  Filled 2021-09-10 (×2): qty 1

## 2021-09-10 MED ORDER — GUAIFENESIN ER 600 MG PO TB12
600.0000 mg | ORAL_TABLET | Freq: Two times a day (BID) | ORAL | Status: DC
  Administered 2021-09-10 – 2021-09-11 (×3): 600 mg via ORAL
  Filled 2021-09-10 (×3): qty 1

## 2021-09-10 MED ORDER — MONTELUKAST SODIUM 10 MG PO TABS: 10.0000 mg | ORAL_TABLET | Freq: Every day | ORAL | Status: DC

## 2021-09-10 MED ORDER — POTASSIUM CHLORIDE CRYS ER 20 MEQ PO TBCR
40.0000 meq | EXTENDED_RELEASE_TABLET | Freq: Once | ORAL | Status: AC
  Administered 2021-09-10: 40 meq via ORAL
  Filled 2021-09-10: qty 2

## 2021-09-10 MED ORDER — ARFORMOTEROL TARTRATE 15 MCG/2ML IN NEBU
15.0000 ug | INHALATION_SOLUTION | Freq: Two times a day (BID) | RESPIRATORY_TRACT | Status: DC
  Administered 2021-09-10 – 2021-09-11 (×2): 15 ug via RESPIRATORY_TRACT
  Filled 2021-09-10 (×2): qty 2

## 2021-09-10 MED ORDER — METHYLPREDNISOLONE SODIUM SUCC 125 MG IJ SOLR
125.0000 mg | Freq: Two times a day (BID) | INTRAMUSCULAR | Status: DC
  Administered 2021-09-10: 125 mg via INTRAVENOUS
  Filled 2021-09-10: qty 2

## 2021-09-10 MED ORDER — METHYLPREDNISOLONE SODIUM SUCC 125 MG IJ SOLR: 60.0000 mg | Freq: Three times a day (TID) | INTRAMUSCULAR | Status: DC

## 2021-09-10 NOTE — Progress Notes (Signed)
LVAD Coordinator Rounding Note:  Admitted 09/09/21 to Dr Claris Gladden service due to COPD exacerbation.   Pt reports he has been experiencing increased exertional dyspnea, wheezing, and coughing for the last 1.5 weeks. He felt increasingly worse on Saturday night so he presented to [facility]. Reports lightheadedness with coughing episodes. COVID and Influenza negative.  HM III LVAD implanted on 11/17/20 by Dr Orvan Seen under destination therapy criteria.   Pt laying in bed this morning on my arrival. States he feels ok, but does not feel like his breathing is at baseline. No PNA noted on initial chest xray. WBC 15.4 today. Currently receiving steroids, nebulizer, and Doxycycline. Will consult pulmonology team per Dr Aundra Dubin.   Multiple PI events noted on VAD interrogation. Will hold [place] today per Dr Aundra Dubin. Denies dizziness or lightheadedness other than with coughing episodes. Denies signs of bleeding. Encouraged to increase PO intake.   Sternal wires superficially noted along previous sternotomy site. Pt denies pain or discomfort in area.    Pt has yellow advisory back up battery fault that triggered last night. Replaced backup battery in primary controller with [id number]. Manufacture: 05/24/21 Exp: 04/24/23. Replace battery in 32 months.  Patient has a large tear present in modular cable. Pt cable twisted at odd angle in that area. Also noted to have large amount of paper tape with glove inside the tape wrapped around modular cable connection point "so I can shower". Discussed never placing tape in that area due to possible need to access in an emergency. Pt verbalized understanding. Rescue tape intact above paper tape from previous drive line casing tear site- left intact. Removed paper tape/glove. Connected patient's backup controller to wall power, placed new modular cable in controller, disconnected patient from primary controller modular cable, and reconnected to new modular cable. Pt's pump  restarted with no issue. Replaced modular cable with [id number] Manufacture: 06/15/21 Exp: 06/15/24. Pt's primary controller now [id number]. Replace backup battery in 22 months.     Vital signs: Temp: 97.8 HR: 79 Doppler Pressure: 98 Automatic BP: 104/81 (90) O2 Sat: 97% on RA Wt: 188.5 lbs     LVAD interrogation reveals:  Speed: 5600 Flow: 4.1 Power:  4.3 w PI: 4.3  Hct: 39   Alarms: back up battery fault- battery replaced as documented above Events:  several events today  Fixed speed: 5600 Low speed limit: 5300   Drive Line: Pt reports his wife Linus Orn has been changing dressing every other day with recent drive line infection. Existing VAD dressing removed and site care performed using sterile technique. Drive line exit site cleaned with Chlora prep applicators x 2, allowed to dry, and Sorbaview dressing with Silverlon patch applied. Exit site healed and incorporated, the velour is fully implanted at exit site. Small crusty scab noted at exit site that was easily removed with cleansing. No redness, tenderness, drainage, foul odor or rash noted. Drive line anchor re-applied. Pt denies fever or chills. Discussed Silverlon patch, and steps for use, with patient. He verbalized understanding. Will advance to weekly dressing changes by bedside RN, or patient's wife, using weekly kit. Next dressing change due 09/17/21.      Labs:  LDH trend: 181  INR trend: 2.8  WBC: 8.2>15.4  Anticoagulation Plan: -INR Goal: 2.0 - 2.5 -ASA Dose: none  Plan/Recommendations:   1. Page VAD coordinator for VAD equipment or drive line issues 2. Weekly drive line dressing changes per bedside RN, or patient's wife  Emerson Monte RN [place] Coordinator  Office: [phone]  24/7 Pager: [phone]

## 2021-09-10 NOTE — Progress Notes (Signed)
[facility]  PROGRESS NOTE  Kevin Underwood RSW:[id number] DOB: 01-Dec-1961 DOA: 09/08/2021 PCP: Kirstie Peri, MD   Brief HPI:   60 year old male with medical history of CAD, chronic systolic congestive heart failure, s/p LVAD, COPD, essential hypertension, paroxysmal atrial fibrillation, diabetes mellitus type 2 who was [facility] for progressive shortness of breath and wheezing.  Patient has chronic systolic congestive heart failure, s/p LVAD and was transferred to [facility] for evaluation by cardiology.     Subjective   Patient seen and examined, continues to have wheezing.  Still requiring 2 L/min of oxygen.   Assessment/Plan:     COPD exacerbation -Patient still has wheezing -We will discontinue prednisone and start Solu-Medrol 60 mg IV every 6 hours -Continue Pulmicort nebulizer twice a day -Continue DuoNeb nebulizers every 6 hours -Continue doxycycline 100 mg p.o. twice daily for 7 days -Start Mucinex 600 mg p.o. twice daily  Hypokalemia -Replete  Chronic systolic heart failure s/p LVAD -Management per primary cardiology -Continue Entresto 24/26 mg twice a day -Continue statin  Paroxysmal atrial fibrillation -Heart rate controlled -Continue amiodarone 200 mg p.o. daily -Continue warfarin per pharmacy  Diabetes mellitus type 2 -Hold metformin while inpatient -Continue sliding scale insulin with NovoLog -CBG well controlled  Peripheral neuropathy -Continue gabapentin  Mood disorder -Continue olanzapine, sertraline  Insomnia -Continue trazodone 150 mg p.o. nightly       Scheduled medications:    amiodarone  200 mg Oral Daily   arformoterol  15 mcg Nebulization BID   budesonide (PULMICORT) nebulizer solution  0.25 mg Nebulization BID   doxycycline  100 mg Oral Q12H   gabapentin  600 mg Oral BID   guaiFENesin  600 mg Oral BID   insulin aspart  0-15 Units Subcutaneous TID WC   insulin aspart  0-5 Units Subcutaneous QHS    loratadine  10 mg Oral Daily   OLANZapine  5 mg Oral QHS   pantoprazole  40 mg Oral Daily   [START ON 09/11/2021] predniSONE  40 mg Oral Q breakfast   [START ON 09/11/2021] revefenacin  175 mcg Nebulization Daily   rosuvastatin  20 mg Oral Daily   sacubitril-valsartan  1 tablet Oral BID   sertraline  50 mg Oral Daily   traZODone  150 mg Oral QHS   warfarin  1 mg Oral ONCE-1600   Warfarin - Pharmacist Dosing Inpatient   Does not apply q1600     Data Reviewed:   CBG:  Recent Labs  Lab 09/09/21 1112 09/09/21 1652 09/09/21 2122 09/10/21 0604 09/10/21 1144  GLUCAP 359* 247* 254* 181* 229*    SpO2: 97 % O2 Flow Rate (L/min): 2 L/min    Vitals:   09/10/21 0500 09/10/21 0734 09/10/21 0744 09/10/21 1142  BP: 114/90  104/81 (!) 114/98  Pulse: 86  96 84  Resp: 18 (!) 23 17 15   Temp:   97.8 F (36.6 C) 97.6 F (36.4 C)  TempSrc:   Oral Oral  SpO2:  91% 91% 97%  Weight:      Height:        No intake or output data in the 24 hours ending 09/10/21 1419  10/01 1901 - 10/03 0700 In: -  Out: 850 [Urine:850]  Filed Weights   09/08/21 2309 09/09/21 1028 09/10/21 0438  Weight: 90.7 kg 86.9 kg 85.5 kg    Data Reviewed: Basic Metabolic Panel: Recent Labs  Lab 09/08/21 2315 09/09/21 0313 09/10/21 0049  NA 134* 133* 133*  K 3.4*  3.8 3.9  CL 99 100 101  CO2 26 24 21*  GLUCOSE 319* 309* 230*  BUN 18 17 24*  CREATININE 0.89 0.75 0.93  CALCIUM 8.8* 8.6* 9.1  MG  --  2.1 2.2   Liver Function Tests: Recent Labs  Lab 09/09/21 0313  AST 25  ALT 28  ALKPHOS 97  BILITOT 0.6  PROT 7.2  ALBUMIN 3.7   No results for input(s): LIPASE, AMYLASE in the last 168 hours. No results for input(s): AMMONIA in the last 168 hours. CBC: Recent Labs  Lab 09/08/21 2315 09/09/21 0313 09/10/21 0049  WBC 9.6 8.2 15.4*  NEUTROABS  --  7.3  --   HGB 14.6 14.2 13.5  HCT 43.8 42.0 39.4  MCV 86.2 86.1 83.3  PLT 218 215 242   Cardiac Enzymes: No results for input(s): CKTOTAL,  CKMB, CKMBINDEX, TROPONINI in the last 168 hours. BNP (last 3 results) Recent Labs    11/08/20 1344 11/18/20 0327 11/24/20 0045  BNP 910.0* 392.0* 252.0*    ProBNP (last 3 results) No results for input(s): PROBNP in the last 8760 hours.  CBG: Recent Labs  Lab 09/09/21 1112 09/09/21 1652 09/09/21 2122 09/10/21 0604 09/10/21 1144  GLUCAP 359* 247* 254* 181* 229*       Radiology Reports  DG Chest Port 1 View  Result Date: 09/08/2021 CLINICAL DATA:  Chest pain for 1 day with cough EXAM: PORTABLE CHEST 1 VIEW COMPARISON:  07/12/2021 FINDINGS: Cardiac shadow is stable. Postsurgical changes are noted as well as an LVAD in place. Aortic calcifications are seen. Lungs are well aerated without focal infiltrate or effusion. No bony abnormality is noted. Old left clavicular fracture is seen. IMPRESSION: No acute abnormality noted.  LVAD is seen in place. Electronically Signed   By: Alcide Clever M.D.   On: 09/08/2021 23:51       Antibiotics: Anti-infectives (From admission, onward)    Start     Dose/Rate Route Frequency Ordered Stop   09/09/21 1115  doxycycline (VIBRA-TABS) tablet 100 mg        100 mg Oral Every 12 hours 09/09/21 1015 09/16/21 0959         Objective    Physical Examination:  General-appears in no acute distress Heart-S1-S2, regular Lungs-bilateral wheezing auscultated Abdomen-soft, nontender, no organomegaly Extremities-no edema in the lower extremities Neuro-alert, oriented x3, no focal deficit noted    COVID-19 Labs  Recent Labs    09/09/21 1816 09/10/21 0049  LDH 204* 181    Lab Results  Component Value Date   SARSCOV2NAA NEGATIVE 09/08/2021   SARSCOV2NAA NEGATIVE 11/08/2020   SARSCOV2NAA NEGATIVE 09/22/2020   SARSCOV2NAA Detected (A) 12/22/2019            Recent Results (from the past 240 hour(s))  Resp Panel by RT-PCR (Flu A&B, Covid) Nasopharyngeal Swab     Status: None   Collection Time: 09/08/21 11:59 PM   Specimen:  Nasopharyngeal Swab; Nasopharyngeal(NP) swabs in vial transport medium  Result Value Ref Range Status   SARS Coronavirus 2 by RT PCR NEGATIVE NEGATIVE Final    Comment: (NOTE) SARS-CoV-2 target nucleic acids are NOT DETECTED.  The SARS-CoV-2 RNA is generally detectable in upper respiratory specimens during the acute phase of infection. The lowest concentration of SARS-CoV-2 viral copies this assay can detect is 138 copies/mL. A negative result does not preclude SARS-Cov-2 infection and should not be used as the sole basis for treatment or other patient management decisions. A negative result may occur with  improper specimen collection/handling, submission of specimen other than nasopharyngeal swab, presence of viral mutation(s) within the areas targeted by this assay, and inadequate number of viral copies(<138 copies/mL). A negative result must be combined with clinical observations, patient history, and epidemiological information. The expected result is Negative.  Fact Sheet for Patients:  BloggerCourse.com  Fact Sheet for Healthcare Providers:  SeriousBroker.it  This test is no t yet approved or cleared by the [place] FDA and  has been authorized for detection and/or diagnosis of SARS-CoV-2 by FDA under an Emergency Use Authorization (EUA). This EUA will remain  in effect (meaning this test can be used) for the duration of the COVID-19 declaration under Section 564(b)(1) of the Act, 21 U.S.C.section 360bbb-3(b)(1), unless the authorization is terminated  or revoked sooner.       Influenza A by PCR NEGATIVE NEGATIVE Final   Influenza B by PCR NEGATIVE NEGATIVE Final    Comment: (NOTE) The Xpert Xpress SARS-CoV-2/FLU/RSV plus assay is intended as an aid in the diagnosis of influenza from Nasopharyngeal swab specimens and should not be used as a sole basis for treatment. Nasal washings and aspirates are unacceptable for  Xpert Xpress SARS-CoV-2/FLU/RSV testing.  Fact Sheet for Patients: BloggerCourse.com  Fact Sheet for Healthcare Providers: SeriousBroker.it  This test is not yet approved or cleared by the [place] FDA and has been authorized for detection and/or diagnosis of SARS-CoV-2 by FDA under an Emergency Use Authorization (EUA). This EUA will remain in effect (meaning this test can be used) for the duration of the COVID-19 declaration under Section 564(b)(1) of the Act, 21 U.S.C. section 360bbb-3(b)(1), unless the authorization is terminated or revoked.  Performed at [facility], [address], [place], [place] [address]     Meredeth Ide   [facility] If 7PM-7AM, please contact night-coverage at [email or web address], Office  [phone]   09/10/2021, 2:19 PM  LOS: 1 day

## 2021-09-10 NOTE — Progress Notes (Signed)
Mobility Specialist: Progress Note   09/10/21 1548  Mobility  Activity Ambulated in hall  Level of Assistance Independent  Assistive Device None  Distance Ambulated (ft) 340 ft  Mobility Ambulated independently in hallway  Mobility Response Tolerated well  Mobility performed by Mobility specialist  Bed Position Chair  $Mobility charge 1 Mobility   Pre-Mobility: 95 HR, 116/76 BP, 92% SpO2 Post-Mobility: 95 HR, 117/88 BP, 92% SpO2  Pt required assistance with transfer on/off batteries for LVAD pre- and post-mobility, otherwise independent. Pt to recliner after walk with call bell and phone at his side, family member present in the room.   [facility] Bari Leib Mobility Specialist Mobility Specialist Phone: [phone]

## 2021-09-10 NOTE — TOC Initial Note (Signed)
Transition of Care [facility]) - Initial/Assessment Note    Patient Details  Name: Kevin Underwood MRN: [id number] Date of Birth: 10/02/1961  [facility]) CM/SW Contact:    Justyn Langham, LCSWA Phone Number: 09/10/2021, 2:14 PM  Clinical Narrative:                 HF CSW spoke with Mr. Kevin Underwood and his spouse, Kevin Underwood at bedside and completed a very brief SDOH with the patient who denied having any social needs at this time. However, Kevin Underwood reported that he would like to change his code status to Full code instead of partial. CSW reached out to Dr. Shirlee Latch regarding the status change. Mr. Kevin Underwood reported he does have a PCP and he can get to the pharmacy to pick up his medications as he is still driving. CSW provided the patient with the social workers name and position and if anything changes to please reach out so that the CSW can provide support.  CSW will continue to follow throughout discharge.   Barriers to Discharge: Continued Medical Work up   Patient Goals and CMS Choice        Expected Discharge Plan and Services   In-house Referral: Clinical Social Work     Living arrangements for the past 2 months: Apartment                                      Prior Living Arrangements/Services Living arrangements for the past 2 months: Apartment Lives with:: Self, Spouse Patient language and need for interpreter reviewed:: Yes        Need for Family Participation in Patient Care: No (Comment) Care giver support system in place?: No (comment)   Criminal Activity/Legal Involvement Pertinent to Current Situation/Hospitalization: No - Comment as needed  Activities of Daily Living      Permission Sought/Granted                  Emotional Assessment Appearance:: Appears stated age Attitude/Demeanor/Rapport: Engaged Affect (typically observed): Pleasant Orientation: : Oriented to Self, Oriented to Place, Oriented to  Time, Oriented to Situation    Psych Involvement: No (comment)  Admission diagnosis:  Cough [R05.9] COPD (chronic obstructive pulmonary disease) (HCC) [J44.9] LVAD (left ventricular assist device) present (HCC) [Z95.811] Chronic obstructive pulmonary disease with acute exacerbation (HCC) [J44.1] Patient Active Problem List   Diagnosis Date Noted   COPD (chronic obstructive pulmonary disease) (HCC) 09/09/2021   Malnutrition of moderate degree 11/17/2020   CHF (congestive heart failure), NYHA class IV, chronic, systolic (HCC) 11/17/2020   Palliative care by specialist    Goals of care, counseling/discussion    Hyperlipidemia    CHF exacerbation (HCC) 09/23/2020   Unstable angina (HCC) 09/22/2020   LVAD (left ventricular assist device) present (HCC)    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   Cardiogenic shock (HCC)    Acute respiratory failure (HCC)    RUQ abdominal pain    Cardiomyopathy (HCC) 11/21/2019   Acute systolic CHF (congestive heart failure) (HCC) 11/21/2019   History of alcohol abuse Quit 6 mos ago 11/21/2019   History of recreational drug use 11/21/2019   PAF (paroxysmal atrial fibrillation) (HCC) 11/21/2019   Cardiac volume overload 11/21/2019   Elevated brain natriuretic peptide (BNP) level 11/21/2019   Leg edema, right 11/21/2019   Hyponatremia 11/21/2019   Abnormal electrocardiogram (ECG) (EKG) 11/21/2019   Hyperglycemia 11/21/2019  Tremulousness 11/21/2019   Restlessness and agitation 11/21/2019   OSA (obstructive sleep apnea) 11/21/2019   Atrial fibrillation with RVR (HCC) 11/20/2019   Acute congestive heart failure (HCC) 11/20/2019   Acute respiratory distress 11/20/2019   COPD with acute exacerbation (HCC)    Hypertension    Type 2 diabetes mellitus (HCC)    Closed fracture of 5th metacarpal 08/31/2012   PCP:  Kirstie Peri, MD Pharmacy:   [phone] [place] [place] - [address] [facility] [address] [place] [address] Phone: [phone] Fax:  [phone]  [facility] [address] [place] [address] Phone: [phone] Fax: [phone]     Social Determinants of Health (SDOH) Interventions Food Insecurity Interventions: Intervention Not Indicated Financial Strain Interventions: Intervention Not Indicated Housing Interventions: Intervention Not Indicated Transportation Interventions: [facility]  Readmission Risk Interventions Readmission Risk Prevention Plan 11/28/2020  Transportation Screening Complete  Medication Review Oceanographer) Complete  HRI or Home Care Consult Complete  SW Recovery Care/Counseling Consult Complete  Palliative Care Screening Not Applicable  Skilled Nursing Facility Not Applicable  Some recent data might be hidden   Kevin Underwood, MSW, LCSWA [phone] Heart Failure Social Worker

## 2021-09-10 NOTE — TOC Initial Note (Addendum)
Transition of Care [facility]) - Initial/Assessment Note    Patient Details  Name: Kevin Underwood MRN: [id number] Date of Birth: May 31, 1961  Transition of Care [facility]) CM/SW Contact:    [name], RN Phone Number: [phone] 09/10/2021, 3:00 PM  Clinical Narrative:                  HF TOC CM spoke to pt and wife. Pt states he will need a new neb machine for home. Will continue to follow for possible home oxygen at dc.   09/11/2021 1037 am Spoke to pt at bedside. His medications will come up from [facility] pharmacy. Pt states he will discuss with his PCP about new nebulizer machine. Home Oxygen is not needed at dc.    Expected Discharge Plan: (P) Home/Self Care Barriers to Discharge: Continued Medical Work up   Patient Goals and CMS Choice     Choice offered to / list presented to : (P) Patient  Expected Discharge Plan and Services Expected Discharge Plan: (P) Home/Self Care In-house Referral: Clinical Social Work Discharge Planning Services: (P) CM Consult Post Acute Care Choice: (P) Durable Medical Equipment Living arrangements for the past 2 months: Apartment                                      Prior Living Arrangements/Services Living arrangements for the past 2 months: Apartment Lives with:: Self, Spouse Patient language and need for interpreter reviewed:: Yes        Need for Family Participation in Patient Care: No (Comment) Care giver support system in place?: No (comment)   Criminal Activity/Legal Involvement Pertinent to Current Situation/Hospitalization: No - Comment as needed  Activities of Daily Living      Permission Sought/Granted                  Emotional Assessment Appearance:: Appears stated age Attitude/Demeanor/Rapport: Engaged Affect (typically observed): Pleasant Orientation: : Oriented to Self, Oriented to Place, Oriented to  Time, Oriented to Situation   Psych Involvement: No (comment)  Admission diagnosis:  Cough  [R05.9] COPD (chronic obstructive pulmonary disease) (HCC) [J44.9] LVAD (left ventricular assist device) present (HCC) [Z95.811] Chronic obstructive pulmonary disease with acute exacerbation (HCC) [J44.1] Patient Active Problem List   Diagnosis Date Noted   COPD (chronic obstructive pulmonary disease) (HCC) 09/09/2021   Malnutrition of moderate degree 11/17/2020   CHF (congestive heart failure), NYHA class IV, chronic, systolic (HCC) 11/17/2020   Palliative care by specialist    Goals of care, counseling/discussion    Hyperlipidemia    CHF exacerbation (HCC) 09/23/2020   Unstable angina (HCC) 09/22/2020   LVAD (left ventricular assist device) present (HCC)    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   Cardiogenic shock (HCC)    Acute respiratory failure (HCC)    RUQ abdominal pain    Cardiomyopathy (HCC) 11/21/2019   Acute systolic CHF (congestive heart failure) (HCC) 11/21/2019   History of alcohol abuse Quit 6 mos ago 11/21/2019   History of recreational drug use 11/21/2019   PAF (paroxysmal atrial fibrillation) (HCC) 11/21/2019   Cardiac volume overload 11/21/2019   Elevated brain natriuretic peptide (BNP) level 11/21/2019   Leg edema, right 11/21/2019   Hyponatremia 11/21/2019   Abnormal electrocardiogram (ECG) (EKG) 11/21/2019   Hyperglycemia 11/21/2019   Tremulousness 11/21/2019   Restlessness and agitation 11/21/2019   OSA (obstructive sleep apnea) 11/21/2019   Atrial  fibrillation with RVR (HCC) 11/20/2019   Acute congestive heart failure (HCC) 11/20/2019   Acute respiratory distress 11/20/2019   COPD with acute exacerbation (HCC)    Hypertension    Type 2 diabetes mellitus (HCC)    Closed fracture of 5th metacarpal 08/31/2012   PCP:  Kirstie Peri, MD Pharmacy:   [phone] [place] [place] - [address] AT [facility] [address] [place] [address] [place] [address] Phone: [phone] Fax: [phone]  [facility] [address] [place] [address] Phone: [phone] Fax: [phone]     Social Determinants of Health (SDOH) Interventions Food Insecurity Interventions: Intervention Not Indicated Financial Strain Interventions: Intervention Not Indicated Housing Interventions: Intervention Not Indicated Transportation Interventions: [facility]  Readmission Risk Interventions Readmission Risk Prevention Plan 11/28/2020  Transportation Screening Complete  Medication Review Oceanographer) Complete  HRI or Home Care Consult Complete  SW Recovery Care/Counseling Consult Complete  Palliative Care Screening Not Applicable  Skilled Nursing Facility Not Applicable  Some recent data might be hidden

## 2021-09-10 NOTE — Progress Notes (Signed)
ANTICOAGULATION CONSULT NOTE   Pharmacy Consult for warfarin Indication: LVAD (HM3)  No Known Allergies  Patient Measurements: Height: 5\' 10"  (177.8 cm) Weight: 85.5 kg (188 lb 7.9 oz) IBW/kg (Calculated) : 73 Heparin Dosing Weight:   Vital Signs: Temp: 97.7 F (36.5 C) (10/03 0438) Temp Source: Oral (10/03 0438) BP: 114/90 (10/03 0500) Pulse Rate: 86 (10/03 0500)  Labs: Recent Labs    09/08/21 2315 09/09/21 0132 09/09/21 0313 09/09/21 1816 09/10/21 0049  HGB 14.6  --  14.2  --  13.5  HCT 43.8  --  42.0  --  39.4  PLT 218  --  215  --  242  LABPROT 29.4*  --   --  29.3* 29.5*  INR 2.8*  --   --  2.8* 2.8*  CREATININE 0.89  --  0.75  --  0.93  TROPONINIHS 11 11  --   --   --      Estimated Creatinine Clearance: 87.2 mL/min (by C-G formula based on SCr of 0.93 mg/dL).   Medical History: Past Medical History:  Diagnosis Date   Arthritis    CAD (coronary artery disease)    a. s/p CABG in 11/2019 with LIMA-LAD, SVG-OM1, SVG-PDA and SVG-D1   CHF (congestive heart failure) (HCC)    a. EF < 20% by echo in 11/2019   COPD (chronic obstructive pulmonary disease) ([place])    Essential hypertension    PAF (paroxysmal atrial fibrillation) (HCC)    Type 2 diabetes mellitus (HCC)     Medications:  Medications Prior to Admission  Medication Sig Dispense Refill Last Dose   albuterol (PROVENTIL) (2.5 MG/3ML) 0.083% nebulizer solution Take 3 mLs (2.5 mg total) by nebulization every 6 (six) hours as needed for wheezing or shortness of breath. 75 mL 6 09/08/2021   amiodarone (PACERONE) 200 MG tablet Take 1 tablet (200 mg total) by mouth daily. Take 1 pill twice daily for 10 days and then resume a dose of 1 pill daily 90 tablet 3 09/08/2021   COMBIVENT RESPIMAT 20-100 MCG/ACT AERS respimat Inhale 1 puff into the lungs every 6 (six) hours as needed. 4 g 1 09/08/2021   dapagliflozin propanediol (FARXIGA) 5 MG TABS tablet Take 1 tablet (5 mg total) by mouth daily before breakfast.  (Patient taking differently: Take 10 mg by mouth daily before breakfast.) 30 tablet 3 09/08/2021   gabapentin (NEURONTIN) 600 MG tablet Take 600 mg by mouth 2 (two) times daily.   09/08/2021   metFORMIN (GLUCOPHAGE) 1000 MG tablet TAKE 1 TABLET (1,000 MG TOTAL) BY MOUTH IN THE MORNING AND AT BEDTIME. 180 tablet 3 09/08/2021   Multiple Vitamin (MULTIVITAMIN WITH MINERALS) TABS tablet Take 1 tablet by mouth daily.   09/08/2021   OLANZapine (ZYPREXA) 5 MG tablet Take 5 mg by mouth at bedtime.   09/07/2021   pantoprazole (PROTONIX) 40 MG tablet Take 1 tablet (40 mg total) by mouth daily. 30 tablet 6 09/08/2021   rosuvastatin (CRESTOR) 20 MG tablet Take 20 mg by mouth daily.   09/08/2021   sacubitril-valsartan (ENTRESTO) 24-26 MG Take 1 tablet by mouth 2 (two) times daily. 60 tablet 6 09/08/2021   sertraline (ZOLOFT) 50 MG tablet TAKE 1 TABLET BY MOUTH EVERY DAY (Patient taking differently: Take 50 mg by mouth daily.) 90 tablet 3 09/08/2021   Tetrahydrozoline HCl (REDNESS RELIEVER EYE DROPS OP) Apply 2 drops to eye daily as needed.   Past Month   traZODone (DESYREL) 150 MG tablet TAKE 1 TABLET BY MOUTH AT  BEDTIME. (Patient taking differently: Take 150 mg by mouth at bedtime.) 90 tablet 3 09/07/2021   warfarin (COUMADIN) 4 MG tablet Take 6 mg (1.5 tabs) on Mon/Wed/Fri and 4 mg (1 tab) all other days or as directed by HF Clinic 60 tablet 11 09/08/2021   blood glucose meter kit and supplies KIT Dispense based on patient and insurance preference. Use to check CBG's three times a day. (FOR ICD-9 250.00, 250.01). 1 each 0    colchicine 0.6 MG tablet Take 1 tablet (0.6 mg total) by mouth as needed. (Patient not taking: No sig reported) 60 tablet 3 Not Taking   cyclobenzaprine (FLEXERIL) 5 MG tablet Take 1-2 tablets (5-10 mg total) by mouth 2 (two) times daily as needed for muscle spasms. (Patient not taking: No sig reported) 45 tablet 3 Completed Course   doxycycline (VIBRAMYCIN) 100 MG capsule Take 1 capsule (100 mg total)  by mouth 2 (two) times daily. (Patient not taking: Reported on 09/09/2021) 14 capsule 0 Completed Course   mometasone-formoterol (DULERA) 200-5 MCG/ACT AERO Inhale 2 puffs into the lungs in the morning and at bedtime. (Patient not taking: Reported on 09/09/2021) 1 each 2 Not Taking   potassium chloride SA (KLOR-CON) 20 MEQ tablet Take 2 tablets (40 mEq total) by mouth daily. (Patient not taking: No sig reported) 60 tablet 3 Not Taking    Assessment: Pharmacy consulted to dose warfarin in patient with LVAD.  Patient's INR on admission is 2.8, and is unchanged this morning. Patient admitted with COPD exacerbation and started on doxycycline 100 mg q12h on 10/2. The doxycycline can additionally increase INR.    Home dose listed as 6 mg on MWF and 4 mg ROW  per anti-coag clinic note. Last dose PTA was 10/1 at 1800.   Goal of Therapy:  INR 2-2.5 Monitor platelets by anticoagulation protocol: Yes   Plan:  Warfarin 1 mg x 1 dose. Monitor daily INR and s/s of bleeding.  Erin Hearing PharmD., BCPS Clinical Pharmacist 09/10/2021 7:11 AM

## 2021-09-10 NOTE — TOC Benefit Eligibility Note (Signed)
Patient Product/process development scientist completed.    The patient is currently admitted and upon discharge could be taking Trelegy Ellipta 100-62.5-2.5.  The current 30 day co-pay is, $0.00.   The patient is currently admitted and upon discharge could be taking Breztri.  The current 30 day co-pay is, $0.00.   The patient is currently admitted and upon discharge could be taking Bevespi  The current 30 day co-pay is, $0.00.   The patient is currently admitted and upon discharge could be taking Stiolto  The current 30 day co-pay is, $0.00.   The patient is currently admitted and upon discharge could be taking Duaklir.  Non Formulary   The patient is insured through [employer] Medicaid     Roland Earl, CPhT Pharmacy Patient Advocate Specialist [facility] Antimicrobial Stewardship Team Direct Number: [phone]  Fax: [phone]

## 2021-09-10 NOTE — Consult Note (Signed)
NAME:  Kevin Underwood, MRN:  [id number], DOB:  1961-07-14, LOS: 1 ADMISSION DATE:  09/08/2021, CONSULTATION DATE:  09/10/21 REFERRING MD:  Shirlee Latch, CHIEF COMPLAINT:  COPD   History of present illness   Kevin Underwood is a pleasant 60 year old man with extensive cardiac history now with LVAD in place who presents for progressive dyspnea. 45 pack year smoking hx, quit 2 years ago Current inhaler regimen is albuterol BID and PRN combivent? Spirometry 11/10/20 showing ratio 0.61, FEV1 1.8L (49% pred) Gets seasonal exacerbations requiring streroids Does not have a lung doctor Currently on steroids, doxycycline with some improvement Eos on previous labs vary between 0-500 cells; none collected prior to steroids at present No rhinitis symptoms, no GERD symptoms but on PPI PCCM consulted to help with management  Past Medical History  Ischemic cardiomyopathy s/p LVAD on AC DM2 Gout Prior heavy EtOH and drug use Afib post Maze and LA appendage clip  Significant Hospital Events   10/2 admitted  Consults:  TRH, CHF, PCCM  Procedures:  N/a  Significant Diagnostic Tests:  CXR clear No white count CBGs up  Micro Data:  N/a  Antimicrobials:  Doxycycline 10/2>>   Interim history/subjective:  N/a  Objective   Blood pressure (!) 114/98, pulse 84, temperature 97.6 F (36.4 C), temperature source Oral, resp. rate 15, height 5\' 10"  (1.778 m), weight 85.5 kg, SpO2 97 %.       No intake or output data in the 24 hours ending 09/10/21 1213 Filed Weights   09/08/21 2309 09/09/21 1028 09/10/21 0438  Weight: 90.7 kg 86.9 kg 85.5 kg    Examination: Constitutional: elderly man in no acute distress Eyes: eyes are anicteric, reactive to light Ears, nose, mouth, and throat: mucous membranes moist, trachea midline Cardiovascular: LVAD hum, ext warm Respiratory: Scattered wheezing bilaterally, no accessory muscle use Gastrointestinal: abdomen is soft with + BS Skin: No rashes, normal  turgor Neurologic: moves all 4 ext to command Psychiatric: Aox3, fair insight  Resolved Hospital Problem list   N/a  Assessment & Plan:  AECOPD- recurrent issue; may be problem with maintenance inhalers +/- element of TH2 overlay  40+ year packing history-LVAD precludes any benefit to LDCT screening  DM2 with hyperglycmeia  - Triple neb therapy, DC on LABA/LAMA or LABA/LAMA/ICS (will place CM consult to see what is covered) - Drop steroids to PO pred x 5 days then stop - Doxycycline fine but probably not needed - Start loratidine (zyrtec on DC) - Will arrange OP pulm f/u for full PFTs, pulmonary rehab consideration and TH2 testing   Labs   CBC: Recent Labs  Lab 09/08/21 2315 09/09/21 0313 09/10/21 0049  WBC 9.6 8.2 15.4*  NEUTROABS  --  7.3  --   HGB 14.6 14.2 13.5  HCT 43.8 42.0 39.4  MCV 86.2 86.1 83.3  PLT 218 215 242    Basic Metabolic Panel: Recent Labs  Lab 09/08/21 2315 09/09/21 0313 09/10/21 0049  NA 134* 133* 133*  K 3.4* 3.8 3.9  CL 99 100 101  CO2 26 24 21*  GLUCOSE 319* 309* 230*  BUN 18 17 24*  CREATININE 0.89 0.75 0.93  CALCIUM 8.8* 8.6* 9.1  MG  --  2.1 2.2   GFR: Estimated Creatinine Clearance: 87.2 mL/min (by C-G formula based on SCr of 0.93 mg/dL). Recent Labs  Lab 09/08/21 2315 09/09/21 0313 09/10/21 0049  WBC 9.6 8.2 15.4*    Liver Function Tests: Recent Labs  Lab 09/09/21 0313  AST  25  ALT 28  ALKPHOS 97  BILITOT 0.6  PROT 7.2  ALBUMIN 3.7   No results for input(s): LIPASE, AMYLASE in the last 168 hours. No results for input(s): AMMONIA in the last 168 hours.  ABG    Component Value Date/Time   PHART 7.508 (H) 11/20/2020 0438   PCO2ART 35.1 11/20/2020 0438   PO2ART 72 (L) 11/20/2020 0438   HCO3 24.8 09/09/2021 0313   TCO2 29 11/20/2020 0438   ACIDBASEDEF 6.0 (H) 11/24/2019 1718   O2SAT 63.4 09/09/2021 0313     Coagulation Profile: Recent Labs  Lab 09/04/21 0000 09/08/21 2315 09/09/21 1816 09/10/21 0049   INR 2.6 2.8* 2.8* 2.8*    Cardiac Enzymes: No results for input(s): CKTOTAL, CKMB, CKMBINDEX, TROPONINI in the last 168 hours.  HbA1C: Hgb A1c MFr Bld  Date/Time Value Ref Range Status  09/09/2021 03:13 AM 8.9 (H) 4.8 - 5.6 % Final    Comment:    (NOTE) Pre diabetes:          5.7%-6.4%  Diabetes:              >6.4%  Glycemic control for   <7.0% adults with diabetes   11/10/2020 10:48 PM 10.4 (H) 4.8 - 5.6 % Final    Comment:    (NOTE) Pre diabetes:          5.7%-6.4%  Diabetes:              >6.4%  Glycemic control for   <7.0% adults with diabetes     CBG: Recent Labs  Lab 09/09/21 1112 09/09/21 1652 09/09/21 2122 09/10/21 0604 09/10/21 1144  GLUCAP 359* 247* 254* 181* 229*    Review of Systems:    Positive Symptoms in bold:  Constitutional fevers, chills, weight loss, fatigue, anorexia, malaise  Eyes decreased vision, double vision, eye irritation  Ears, Nose, Mouth, Throat sore throat, trouble swallowing, sinus congestion  Cardiovascular chest pain, paroxysmal nocturnal dyspnea, lower ext edema, palpitations   Respiratory SOB, cough, DOE, hemoptysis, wheezing  Gastrointestinal nausea, vomiting, diarrhea  Genitourinary burning with urination, trouble urinating  Musculoskeletal joint aches, joint swelling, back pain  Integumentary  rashes, skin lesions  Neurological focal weakness, focal numbness, trouble speaking, headaches  Psychiatric depression, anxiety, confusion  Endocrine polyuria, polydipsia, cold intolerance, heat intolerance  Hematologic abnormal bruising, abnormal bleeding, unexplained nose bleeds  Allergic/Immunologic recurrent infections, hives, swollen lymph nodes     Past Medical History  Kevin Underwood,  has a past medical history of Arthritis, CAD (coronary artery disease), CHF (congestive heart failure) ([place]), COPD (chronic obstructive pulmonary disease) ([place]), Essential hypertension, PAF (paroxysmal atrial fibrillation) ([place]), and Type 2  diabetes mellitus ([place]).   Surgical History    Past Surgical History:  Procedure Laterality Date   BACK SURGERY     CLIPPING OF ATRIAL APPENDAGE N/A 11/26/2019   Procedure: Clipping Of Atrial Appendage using AtriCure 40 Clip;  Surgeon: Wonda Olds, MD;  Location: [place];  Service: Open Heart Surgery;  Laterality: N/A;   CORONARY ARTERY BYPASS GRAFT N/A 11/26/2019   Procedure: CORONARY ARTERY BYPASS GRAFTING (CABG) using endoscopic greater saphenous vein harvest: svc to OM; svc to Diag; svc to PD; and LIMA to LAD.;  Surgeon: Wonda Olds, MD;  Location: [facility];  Service: Open Heart Surgery;  Laterality: N/A;   FOOT SURGERY     HAND SURGERY     INSERTION OF IMPLANTABLE LEFT VENTRICULAR ASSIST DEVICE N/A 11/17/2020   Procedure: INSERTION OF IMPLANTABLE LEFT VENTRICULAR  ASSIST DEVICE - HM3;  Surgeon: Wonda Olds, MD;  Location: [place];  Service: Open Heart Surgery;  Laterality: N/A;   INTRAOPERATIVE TRANSESOPHAGEAL ECHOCARDIOGRAM  12/04/2019   Procedure: Intraoperative Transesophageal Echocardiogram;  Surgeon: Wonda Olds, MD;  Location: [facility];  Service: Open Heart Surgery;;   MAZE N/A 11/26/2019   Procedure: MAZE using Bilateral Pulmonary Vein isolation.;  Surgeon: Wonda Olds, MD;  Location: [place];  Service: Open Heart Surgery;  Laterality: N/A;   MULTIPLE EXTRACTIONS WITH ALVEOLOPLASTY N/A 11/16/2020   Procedure: MULTIPLE EXTRACTION WITH ALVEOLOPLASTY;  Surgeon: Charlaine Dalton, DMD;  Location: [place];  Service: Dentistry;  Laterality: N/A;   PLACEMENT OF IMPELLA LEFT VENTRICULAR ASSIST DEVICE N/A 11/24/2019   Procedure: PLACEMENT OF IMPELLA 5.5 LEFT VENTRICULAR ASSIST DEVICE;  Surgeon: Wonda Olds, MD;  Location: [place];  Service: Open Heart Surgery;  Laterality: N/A;   PLACEMENT OF IMPELLA LEFT VENTRICULAR ASSIST DEVICE Left 11/10/2020   Procedure: PLACEMENT OF IMPELLA 5.5 LEFT VENTRICULAR ASSIST DEVICE VIA  LEFT AVILLARY ARTERY;  Surgeon: Wonda Olds, MD;   Location: [place];  Service: Open Heart Surgery;  Laterality: Left;   REMOVAL OF IMPELLA LEFT VENTRICULAR ASSIST DEVICE Right 12/04/2019   Procedure: REMOVAL OF IMPELLA LEFT VENTRICULAR ASSIST DEVICE, right axilla;  Surgeon: Wonda Olds, MD;  Location: [place];  Service: Open Heart Surgery;  Laterality: Right;   RIGHT/LEFT HEART CATH AND CORONARY ANGIOGRAPHY N/A 11/24/2019   Procedure: RIGHT/LEFT HEART CATH AND CORONARY ANGIOGRAPHY;  Surgeon: Larey Dresser, MD;  Location: [facility];  Service: Cardiovascular;  Laterality: N/A;   RIGHT/LEFT HEART CATH AND CORONARY/GRAFT ANGIOGRAPHY N/A 11/09/2020   Procedure: RIGHT/LEFT HEART CATH AND CORONARY/GRAFT ANGIOGRAPHY;  Surgeon: Nelva Bush, MD;  Location: [place] [facility];  Service: Cardiovascular;  Laterality: N/A;   TEE WITHOUT CARDIOVERSION N/A 11/24/2019   Procedure: TRANSESOPHAGEAL ECHOCARDIOGRAM (TEE);  Surgeon: Wonda Olds, MD;  Location: [place];  Service: Open Heart Surgery;  Laterality: N/A;   TEE WITHOUT CARDIOVERSION N/A 11/26/2019   Procedure: TRANSESOPHAGEAL ECHOCARDIOGRAM (TEE);  Surgeon: Wonda Olds, MD;  Location: [place];  Service: Open Heart Surgery;  Laterality: N/A;   TEE WITHOUT CARDIOVERSION N/A 11/10/2020   Procedure: TRANSESOPHAGEAL ECHOCARDIOGRAM (TEE);  Surgeon: Wonda Olds, MD;  Location: [place];  Service: Open Heart Surgery;  Laterality: N/A;   TEE WITHOUT CARDIOVERSION N/A 11/17/2020   Procedure: TRANSESOPHAGEAL ECHOCARDIOGRAM (TEE);  Surgeon: Wonda Olds, MD;  Location: [place];  Service: Open Heart Surgery;  Laterality: N/A;     Social History   reports that Kevin Underwood quit smoking about 20 months ago. His smoking use included cigarettes. Kevin Underwood has a 30.00 pack-year smoking history. Kevin Underwood has never used smokeless tobacco. Kevin Underwood reports current alcohol use. Kevin Underwood reports that Kevin Underwood does not use drugs.   Family History   His family history includes Arthritis in an other family member; Asthma in an other family  member; Diabetes in an other family member; Heart disease in his sister; Lung disease in an other family member.   Allergies No Known Allergies   Home Medications  Prior to Admission medications   Medication Sig Start Date End Date Taking? Authorizing Provider  albuterol (PROVENTIL) (2.5 MG/3ML) 0.083% nebulizer solution Take 3 mLs (2.5 mg total) by nebulization every 6 (six) hours as needed for wheezing or shortness of breath. 08/07/21  Yes Larey Dresser, MD  amiodarone (PACERONE) 200 MG tablet Take 1 tablet (200 mg total) by  mouth daily. Take 1 pill twice daily for 10 days and then resume a dose of 1 pill daily 07/12/21  Yes Larey Dresser, MD  COMBIVENT RESPIMAT 20-100 MCG/ACT AERS respimat Inhale 1 puff into the lungs every 6 (six) hours as needed. 12/08/19  Yes Gold, Patrick Jupiter E, PA-C  dapagliflozin propanediol (FARXIGA) 5 MG TABS tablet Take 1 tablet (5 mg total) by mouth daily before breakfast. Patient taking differently: Take 10 mg by mouth daily before breakfast. 09/25/20  Yes Barton Dubois, MD  gabapentin (NEURONTIN) 600 MG tablet Take 600 mg by mouth 2 (two) times daily.   Yes [provider]  metFORMIN (GLUCOPHAGE) 1000 MG tablet TAKE 1 TABLET (1,000 MG TOTAL) BY MOUTH IN THE MORNING AND AT BEDTIME. 06/14/21  Yes Larey Dresser, MD  Multiple Vitamin (MULTIVITAMIN WITH MINERALS) TABS tablet Take 1 tablet by mouth daily. 12/09/19  Yes Gold, Wayne E, PA-C  OLANZapine (ZYPREXA) 5 MG tablet Take 5 mg by mouth at bedtime. 10/25/20  Yes [provider]  pantoprazole (PROTONIX) 40 MG tablet Take 1 tablet (40 mg total) by mouth daily. 08/01/21  Yes Larey Dresser, MD  rosuvastatin (CRESTOR) 20 MG tablet Take 20 mg by mouth daily.   Yes [provider]  sacubitril-valsartan (ENTRESTO) 24-26 MG Take 1 tablet by mouth 2 (two) times daily. 07/12/21  Yes Larey Dresser, MD  sertraline (ZOLOFT) 50 MG tablet TAKE 1 TABLET BY MOUTH EVERY DAY Patient taking differently: Take  50 mg by mouth daily. 05/28/21  Yes Larey Dresser, MD  Tetrahydrozoline HCl (REDNESS RELIEVER EYE DROPS OP) Apply 2 drops to eye daily as needed.   Yes [provider]  traZODone (DESYREL) 150 MG tablet TAKE 1 TABLET BY MOUTH AT BEDTIME. Patient taking differently: Take 150 mg by mouth at bedtime. 01/22/21  Yes Larey Dresser, MD  warfarin (COUMADIN) 4 MG tablet Take 6 mg (1.5 tabs) on Mon/Wed/Fri and 4 mg (1 tab) all other days or as directed by HF Clinic 07/10/21  Yes Orma Render, RPH-CPP  blood glucose meter kit and supplies KIT Dispense based on patient and insurance preference. Use to check CBG's three times a day. (FOR ICD-9 250.00, 250.01). 09/25/20   Barton Dubois, MD  colchicine 0.6 MG tablet Take 1 tablet (0.6 mg total) by mouth as needed. Patient not taking: No sig reported 12/04/20   Larey Dresser, MD  cyclobenzaprine (FLEXERIL) 5 MG tablet Take 1-2 tablets (5-10 mg total) by mouth 2 (two) times daily as needed for muscle spasms. Patient not taking: No sig reported 01/08/21   Larey Dresser, MD  doxycycline (VIBRAMYCIN) 100 MG capsule Take 1 capsule (100 mg total) by mouth 2 (two) times daily. Patient not taking: Reported on 09/09/2021 08/16/21   Larey Dresser, MD  mometasone-formoterol [facility]) 200-5 MCG/ACT AERO Inhale 2 puffs into the lungs in the morning and at bedtime. Patient not taking: Reported on 09/09/2021 09/25/20   Barton Dubois, MD  potassium chloride SA (KLOR-CON) 20 MEQ tablet Take 2 tablets (40 mEq total) by mouth daily. Patient not taking: No sig reported 11/28/20   Lyda Jester M, PA-C  enoxaparin (LOVENOX) 40 MG/0.4ML injection INJECT 0.4 MLS (40 MG TOTAL) INTO THE SKIN EVERY 12 (TWELVE) HOURS. 03/14/21 07/26/21  Larey Dresser, MD

## 2021-09-10 NOTE — Progress Notes (Addendum)
Advanced Heart Failure Rounding Note  PCP-Cardiologist: Nona Dell, MD  HF: Dr. Shirlee Latch  Patient Profile   Kevin Underwood is a 60 y.o. male with history of systolic HF/mixed ischemic nonischemic CM s/p HM III LVAD, CAD s/p CABG, paroxysmal atrial fibrillation (MAZE and LAA clipping at time of CABG), frequent PVCs on amiodarone. Now admitted with COPD exacerbation. Frequent PI events on device interrogation.  Subjective:    Starting to feel better but still with some shortness of breath. Frequent cough and wheezing, chest feels tight. Cough associated with lightheadedness.  02 sats low 90s on RA.  MAPs 90s  VAD interrogation: Speed: 5600 Flow: 4.5 Power: 4.2 PI: 3.2 > 100 PI events this am  Back-up battery due for replacement  Objective:   Weight Range: 85.5 kg Body mass index is 27.05 kg/m.   Vital Signs:   Temp:  [97.7 F (36.5 C)-98.2 F (36.8 C)] 97.7 F (36.5 C) (10/03 0438) Pulse Rate:  [86-100] 86 (10/03 0500) Resp:  [16-20] 18 (10/03 0500) BP: (111-131)/(77-92) 114/90 (10/03 0500) SpO2:  [90 %-94 %] 90 % (10/03 0438) Weight:  [85.5 kg-86.9 kg] 85.5 kg (10/03 0438) Last BM Date: 09/09/21  Weight change: Filed Weights   09/08/21 2309 09/09/21 1028 09/10/21 0438  Weight: 90.7 kg 86.9 kg 85.5 kg    Intake/Output:   Intake/Output Summary (Last 24 hours) at 09/10/2021 0709 Last data filed at 09/09/2021 0836 Gross per 24 hour  Intake --  Output 850 ml  Net -850 ml      Physical Exam    General:  Middle-aged male in NAD HEENT: normal Neck: JVP ~ 8 cm Cor: Mechanical heart sounds with LVAD hum present Lungs: Expiratory wheezes throughout Abdomen: soft, nontender, nondistended.  LVAD exit site: Well-healed and incorporated. Dressing dry and intact. No erythema or drainage. Stabilization device present and accurately applied. Driveline dressing changed daily per sterile technique. Extremities: no cyanosis, clubbing, rash, edema Neuro: alert &  orientedx3, cranial nerves grossly intact. moves all 4 extremities w/o difficulty. Affect pleasant    Telemetry   NSR 90s, one 6 beat run NSVT overnight  EKG    No new  Labs    CBC Recent Labs    09/09/21 0313 09/10/21 0049  WBC 8.2 15.4*  NEUTROABS 7.3  --   HGB 14.2 13.5  HCT 42.0 39.4  MCV 86.1 83.3  PLT 215 242   Basic Metabolic Panel Recent Labs    12/10/70 0313 09/10/21 0049  NA 133* 133*  K 3.8 3.9  CL 100 101  CO2 24 21*  GLUCOSE 309* 230*  BUN 17 24*  CREATININE 0.75 0.93  CALCIUM 8.6* 9.1  MG 2.1 2.2   Liver Function Tests Recent Labs    09/09/21 0313  AST 25  ALT 28  ALKPHOS 97  BILITOT 0.6  PROT 7.2  ALBUMIN 3.7   No results for input(s): LIPASE, AMYLASE in the last 72 hours. Cardiac Enzymes No results for input(s): CKTOTAL, CKMB, CKMBINDEX, TROPONINI in the last 72 hours.  BNP: BNP (last 3 results) Recent Labs    11/08/20 1344 11/18/20 0327 11/24/20 0045  BNP 910.0* 392.0* 252.0*    ProBNP (last 3 results) No results for input(s): PROBNP in the last 8760 hours.   D-Dimer No results for input(s): DDIMER in the last 72 hours. Hemoglobin A1C Recent Labs    09/09/21 0313  HGBA1C 8.9*   Fasting Lipid Panel No results for input(s): CHOL, HDL, LDLCALC, TRIG, CHOLHDL, LDLDIRECT in  the last 72 hours. Thyroid Function Tests No results for input(s): TSH, T4TOTAL, T3FREE, THYROIDAB in the last 72 hours.  Invalid input(s): FREET3  Other results:   Imaging    No results found.   Medications:     Scheduled Medications:  amiodarone  200 mg Oral Daily   arformoterol  15 mcg Nebulization BID   budesonide (PULMICORT) nebulizer solution  0.25 mg Nebulization BID   dapagliflozin propanediol  10 mg Oral Daily   doxycycline  100 mg Oral Q12H   gabapentin  600 mg Oral BID   insulin aspart  0-15 Units Subcutaneous TID WC   insulin aspart  0-5 Units Subcutaneous QHS   ipratropium-albuterol  3 mL Nebulization TID    OLANZapine  5 mg Oral QHS   pantoprazole  40 mg Oral Daily   predniSONE  40 mg Oral Q breakfast   rosuvastatin  20 mg Oral Daily   sacubitril-valsartan  1 tablet Oral BID   sertraline  50 mg Oral Daily   traZODone  150 mg Oral QHS   Warfarin - Pharmacist Dosing Inpatient   Does not apply q1600    Infusions:   PRN Medications: acetaminophen **OR** [DISCONTINUED] acetaminophen, acetaminophen, albuterol, morphine injection, ondansetron **OR** ondansetron (ZOFRAN) IV, oxyCODONE      Assessment/Plan   1. Chronic systolic CHF: Echo 10/21 with EF 20-25%, mildly decreased RV function. LHC/RHC in 12/21 with patent grafts, low output. Suspect mixed ischemic/nonischemic cardiomyopathy (prior heavy ETOH and drugs as well as CAD).  No ETOH, drugs, smoking since CABG in 12/20. Admitted with cardiogenic shock in 12/21, had placement of Impella 5.5 initially, now s/p Heartmate 3 LVAD on 11/17/20.  Not volume overloaded on exam.  - Continues with frequent PI events. He reports dizziness with coughing and poor po intake with COPD exacerbation which may be triggering the PI events. MAP not low.  He is not volume overloaded on exam.   - Encourage po hydration.  - Will discuss replacing backup battery with VAD coordinator - He does not need a diuretic.  - Continue Entresto 24/26 bid.   - Continue dapagliflozin 10 mg daily.  - Continue Coumadin, INR goal 2-2.5. 2.8 this am. Dosing per PharmD. - LDH 204 > 181 2. COPD exacerbation: Diffuse wheezing, improved with steroids and nebs.  No PNA on CXR. Symptoms are improving.  - Continue steroids, nebs, doxycycline per [facility]. Appreciate assistance with management. 3. CAD: S/p CABG 12/20.  LHC pre-VAD with patent grafts, no target for intervention. - Continue statin. 4. Atrial fibrillation: Paroxysmal.  S/p Maze and LA appendage clip with CABG in 12/20.  In NSR today.  5. DM: SSI while inpatient.  -A1c 8.9 6. Prior ETOH, drugs: None x  >1 year.  7. Gout:  Quiescent.  8. PACs/PVCs: Minimal now that he is back on amiodarone. One 6 beat run NSVT overnight. Mag okay, replace K. - Follow LFT and TSH with amiodarone, will also need regular eye exam and PFTs.  - Think we can decrease amiodarone to 100 mg daily.   Length of Stay: 1  FINCH, LINDSAY N, PA-C  09/10/2021, 7:09 AM  Advanced Heart Failure Team Pager [phone] (M-F; 7a - 5p)  Please contact [facility] for night-coverage after hours (5p -7a ) and weekends on [email or web address]  Patient seen with PA, agree with the above note.   Still wheezing but slowly improving.  Still with a lot of PI events on device interrogation.   General: Well appearing this am. NAD.  HEENT: Normal. Neck: Supple, JVP 7-8 cm. Carotids OK.  Cardiac:  Mechanical heart sounds with LVAD hum present.  Lungs:  Bilateral wheezes, decreased BS.   Abdomen:  NT, ND, no HSM. No bruits or masses. +BS  LVAD exit site: Well-healed and incorporated. Dressing dry and intact. No erythema or drainage. Stabilization device present and accurately applied. Driveline dressing changed daily per sterile technique. Extremities:  Warm and dry. No cyanosis, clubbing, rash, or edema.  Neuro:  Alert & oriented x 3. Cranial nerves grossly intact. Moves all 4 extremities w/o difficulty. Affect pleasant    COPD exacerbation, still wheezing.  On steroids, nebs, doxycycline per [facility].   Frequent PI events.  MAP stable 80s-90s.  Suspect volume status relatively low and also likely triggered with coughing.   - Stop dapagliflozin for now, will start back eventually.  - No diuretic.  - Encourage po hydration.   Will change out his backup battery today.   Marca Ancona 09/10/2021 9:13 AM

## 2021-09-11 ENCOUNTER — Other Ambulatory Visit (HOSPITAL_COMMUNITY): Payer: Self-pay

## 2021-09-11 DIAGNOSIS — J441 Chronic obstructive pulmonary disease with (acute) exacerbation: Secondary | ICD-10-CM | POA: Diagnosis not present

## 2021-09-11 DIAGNOSIS — I5022 Chronic systolic (congestive) heart failure: Secondary | ICD-10-CM

## 2021-09-11 DIAGNOSIS — Z7901 Long term (current) use of anticoagulants: Secondary | ICD-10-CM

## 2021-09-11 LAB — BASIC METABOLIC PANEL
Anion gap: 10 (ref 5–15)
BUN: 32 mg/dL — ABNORMAL HIGH (ref 6–20)
CO2: 23 mmol/L (ref 22–32)
Calcium: 9 mg/dL (ref 8.9–10.3)
Chloride: 100 mmol/L (ref 98–111)
Creatinine, Ser: 1.01 mg/dL (ref 0.61–1.24)
GFR, Estimated: >60 mL/min (ref >60)
Glucose, Bld: 228 mg/dL — ABNORMAL HIGH (ref 70–99)
Potassium: 4 mmol/L (ref 3.5–5.1)
Sodium: 133 mmol/L — ABNORMAL LOW (ref 135–145)

## 2021-09-11 LAB — CBC
HCT: 41.4 % (ref 39.0–52.0)
Hemoglobin: 13.8 g/dL (ref 13.0–17.0)
MCH: 28.2 pg (ref 26.0–34.0)
MCHC: 33.3 g/dL (ref 30.0–36.0)
MCV: 84.7 fL (ref 80.0–100.0)
Platelets: 222 10*3/uL (ref 150–400)
RBC: 4.89 MIL/uL (ref 4.22–5.81)
RDW: 13.7 % (ref 11.5–15.5)
WBC: 13.2 10*3/uL — ABNORMAL HIGH (ref 4.0–10.5)
nRBC: 0 % (ref 0.0–0.2)

## 2021-09-11 LAB — LIPID PANEL
Cholesterol: 142 mg/dL (ref 0–200)
HDL: 62 mg/dL (ref >40)
LDL Cholesterol: 61 mg/dL (ref 0–99)
Total CHOL/HDL Ratio: 2.3 RATIO
Triglycerides: 95 mg/dL (ref <150)
VLDL: 19 mg/dL (ref 0–40)

## 2021-09-11 LAB — PROTIME-INR
INR: 2.8 — ABNORMAL HIGH (ref 0.8–1.2)
Prothrombin Time: 29.2 seconds — ABNORMAL HIGH (ref 11.4–15.2)

## 2021-09-11 LAB — GLUCOSE, CAPILLARY
Glucose-Capillary: 281 mg/dL — ABNORMAL HIGH (ref 70–99)
Glucose-Capillary: 159 mg/dL — ABNORMAL HIGH (ref 70–99)

## 2021-09-11 LAB — LACTATE DEHYDROGENASE: LDH: 152 U/L (ref 98–192)

## 2021-09-11 MED ORDER — GUAIFENESIN ER 600 MG PO TB12
600.0000 mg | ORAL_TABLET | Freq: Two times a day (BID) | ORAL | 6 refills | Status: DC
  Filled 2021-09-11: qty 30, 15d supply, fill #0

## 2021-09-11 MED ORDER — WARFARIN SODIUM 2 MG PO TABS
2.0000 mg | ORAL_TABLET | Freq: Once | ORAL | Status: AC
  Administered 2021-09-11: 2 mg via ORAL
  Filled 2021-09-11: qty 1

## 2021-09-11 MED ORDER — AMIODARONE HCL 100 MG PO TABS
100.0000 mg | ORAL_TABLET | Freq: Every day | ORAL | Status: DC
  Administered 2021-09-11: 100 mg via ORAL
  Filled 2021-09-11: qty 1

## 2021-09-11 MED ORDER — WARFARIN SODIUM 4 MG PO TABS: ORAL_TABLET | ORAL | 11 refills | Status: DC

## 2021-09-11 MED ORDER — PREDNISONE 20 MG PO TABS
40.0000 mg | ORAL_TABLET | Freq: Every day | ORAL | 0 refills | Status: AC
  Filled 2021-09-11: qty 6, 3d supply, fill #0

## 2021-09-11 MED ORDER — AMIODARONE HCL 200 MG PO TABS
100.0000 mg | ORAL_TABLET | Freq: Every day | ORAL | 3 refills | Status: DC
  Filled 2021-09-11: qty 30, 60d supply, fill #0

## 2021-09-11 MED ORDER — TRELEGY ELLIPTA 100-62.5-25 MCG/INH IN AEPB
1.0000 | INHALATION_SPRAY | Freq: Every day | RESPIRATORY_TRACT | 6 refills | Status: DC
  Filled 2021-09-11: qty 60, 30d supply, fill #0

## 2021-09-11 MED ORDER — TRELEGY ELLIPTA 100-62.5-25 MCG/INH IN AEPB: 1.0000 | INHALATION_SPRAY | Freq: Every day | RESPIRATORY_TRACT | 2 refills | Status: DC

## 2021-09-11 MED ORDER — LORATADINE 10 MG PO TABS
10.0000 mg | ORAL_TABLET | Freq: Every day | ORAL | 6 refills | Status: DC
  Filled 2021-09-11: qty 30, 30d supply, fill #0

## 2021-09-11 NOTE — Progress Notes (Signed)
[facility]  PROGRESS NOTE  Kevin Underwood CBJ:[id number] DOB: 16-Oct-1961 DOA: 09/08/2021 PCP: Kirstie Peri, MD   Brief HPI:   60 year old male with medical history of CAD, chronic systolic congestive heart failure, s/p LVAD, COPD, essential hypertension, paroxysmal atrial fibrillation, diabetes mellitus type 2 who was [facility] for progressive shortness of breath and wheezing.  Patient has chronic systolic congestive heart failure, s/p LVAD and was transferred to [facility] for evaluation by cardiology.     Subjective   Patient seen and examined, breathing is significantly improved.   Assessment/Plan:     COPD exacerbation -Started on prednisone 40 mg daily by PCCM -Continue Pulmicort nebulizer twice a day -Continue DuoNeb nebulizers every 6 hours -Continue doxycycline 100 mg p.o. twice daily for 7 days -Start Mucinex 600 mg p.o. twice daily  Hypokalemia -Replete  Chronic systolic heart failure s/p LVAD -Management per primary cardiology -Continue Entresto 24/26 mg twice a day -Continue statin  Paroxysmal atrial fibrillation -Heart rate controlled -Continue amiodarone 200 mg p.o. daily -Continue warfarin per pharmacy  Diabetes mellitus type 2 -Hold metformin while inpatient -Continue sliding scale insulin with NovoLog -CBG well controlled  Peripheral neuropathy -Continue gabapentin  Mood disorder -Continue olanzapine, sertraline  Insomnia -Continue trazodone 150 mg p.o. nightly       Scheduled medications:    amiodarone  100 mg Oral Daily   arformoterol  15 mcg Nebulization BID   budesonide (PULMICORT) nebulizer solution  0.25 mg Nebulization BID   doxycycline  100 mg Oral Q12H   gabapentin  600 mg Oral BID   guaiFENesin  600 mg Oral BID   insulin aspart  0-20 Units Subcutaneous TID WC   insulin aspart  0-5 Units Subcutaneous QHS   loratadine  10 mg Oral Daily   OLANZapine  5 mg Oral QHS   pantoprazole  40 mg Oral Daily    predniSONE  40 mg Oral Q breakfast   revefenacin  175 mcg Nebulization Daily   rosuvastatin  20 mg Oral Daily   sacubitril-valsartan  1 tablet Oral BID   sertraline  50 mg Oral Daily   traZODone  150 mg Oral QHS   Warfarin - Pharmacist Dosing Inpatient   Does not apply q1600     Data Reviewed:   CBG:  Recent Labs  Lab 09/10/21 1602 09/10/21 1604 09/10/21 2110 09/11/21 0639 09/11/21 1120  GLUCAP 419* 437* 323* 281* 159*    SpO2: 91 % O2 Flow Rate (L/min): 2 L/min    Vitals:   09/11/21 0843 09/11/21 0844 09/11/21 0845 09/11/21 1121  BP:    118/89  Pulse: 92   89  Resp: 16   (!) 22  Temp:    98.1 F (36.7 C)  TempSrc:    Oral  SpO2: 93% 93% 93% 91%  Weight:      Height:         Intake/Output Summary (Last 24 hours) at 09/11/2021 1406 Last data filed at 09/10/2021 2100 Gross per 24 hour  Intake 200 ml  Output 1600 ml  Net -1400 ml    10/02 1901 - 10/04 0700 In: 200 [P.O.:200] Out: 1600 [Urine:1600]  Filed Weights   09/08/21 2309 09/09/21 1028 09/10/21 0438  Weight: 90.7 kg 86.9 kg 85.5 kg    Data Reviewed: Basic Metabolic Panel: Recent Labs  Lab 09/08/21 2315 09/09/21 0313 09/10/21 0049 09/11/21 0042  NA 134* 133* 133* 133*  K 3.4* 3.8 3.9 4.0  CL 99 100 101 100  CO2 26 24 21* 23  GLUCOSE 319* 309* 230* 228*  BUN 18 17 24* 32*  CREATININE 0.89 0.75 0.93 1.01  CALCIUM 8.8* 8.6* 9.1 9.0  MG  --  2.1 2.2  --    Liver Function Tests: Recent Labs  Lab 09/09/21 0313  AST 25  ALT 28  ALKPHOS 97  BILITOT 0.6  PROT 7.2  ALBUMIN 3.7   No results for input(s): LIPASE, AMYLASE in the last 168 hours. No results for input(s): AMMONIA in the last 168 hours. CBC: Recent Labs  Lab 09/08/21 2315 09/09/21 0313 09/10/21 0049 09/11/21 0042  WBC 9.6 8.2 15.4* 13.2*  NEUTROABS  --  7.3  --   --   HGB 14.6 14.2 13.5 13.8  HCT 43.8 42.0 39.4 41.4  MCV 86.2 86.1 83.3 84.7  PLT 218 215 242 222   Cardiac Enzymes: No results for input(s):  CKTOTAL, CKMB, CKMBINDEX, TROPONINI in the last 168 hours. BNP (last 3 results) Recent Labs    11/08/20 1344 11/18/20 0327 11/24/20 0045  BNP 910.0* 392.0* 252.0*    ProBNP (last 3 results) No results for input(s): PROBNP in the last 8760 hours.  CBG: Recent Labs  Lab 09/10/21 1602 09/10/21 1604 09/10/21 2110 09/11/21 0639 09/11/21 1120  GLUCAP 419* 437* 323* 281* 159*       Radiology Reports  No results found.     Antibiotics: Anti-infectives (From admission, onward)    Start     Dose/Rate Route Frequency Ordered Stop   09/09/21 1115  doxycycline (VIBRA-TABS) tablet 100 mg        100 mg Oral Every 12 hours 09/09/21 1015 09/16/21 0959         Objective    Physical Examination:  General-appears in no acute distress Heart-S1-S2, regular, no murmur auscultated Lungs-clear to auscultation bilaterally, no wheezing or crackles auscultated Abdomen-soft, nontender, no organomegaly Extremities-no edema in the lower extremities Neuro-alert, oriented x3, no focal deficit noted    COVID-19 Labs  Recent Labs    09/09/21 1816 09/10/21 0049 09/11/21 0042  LDH 204* 181 152    Lab Results  Component Value Date   SARSCOV2NAA NEGATIVE 09/08/2021   SARSCOV2NAA NEGATIVE 11/08/2020   SARSCOV2NAA NEGATIVE 09/22/2020   SARSCOV2NAA Detected (A) 12/22/2019            Recent Results (from the past 240 hour(s))  Resp Panel by RT-PCR (Flu A&B, Covid) Nasopharyngeal Swab     Status: None   Collection Time: 09/08/21 11:59 PM   Specimen: Nasopharyngeal Swab; Nasopharyngeal(NP) swabs in vial transport medium  Result Value Ref Range Status   SARS Coronavirus 2 by RT PCR NEGATIVE NEGATIVE Final    Comment: (NOTE) SARS-CoV-2 target nucleic acids are NOT DETECTED.  The SARS-CoV-2 RNA is generally detectable in upper respiratory specimens during the acute phase of infection. The lowest concentration of SARS-CoV-2 viral copies this assay can detect is 138  copies/mL. A negative result does not preclude SARS-Cov-2 infection and should not be used as the sole basis for treatment or other patient management decisions. A negative result may occur with  improper specimen collection/handling, submission of specimen other than nasopharyngeal swab, presence of viral mutation(s) within the areas targeted by this assay, and inadequate number of viral copies(<138 copies/mL). A negative result must be combined with clinical observations, patient history, and epidemiological information. The expected result is Negative.  Fact Sheet for Patients:  BloggerCourse.com  Fact Sheet for Healthcare Providers:  SeriousBroker.it  This test is no t  yet approved or cleared by the [place] and  has been authorized for detection and/or diagnosis of SARS-CoV-2 by FDA under an Emergency Use Authorization (EUA). This EUA will remain  in effect (meaning this test can be used) for the duration of the COVID-19 declaration under Section 564(b)(1) of the Act, 21 U.S.C.section 360bbb-3(b)(1), unless the authorization is terminated  or revoked sooner.       Influenza A by PCR NEGATIVE NEGATIVE Final   Influenza B by PCR NEGATIVE NEGATIVE Final    Comment: (NOTE) The Xpert Xpress SARS-CoV-2/FLU/RSV plus assay is intended as an aid in the diagnosis of influenza from Nasopharyngeal swab specimens and should not be used as a sole basis for treatment. Nasal washings and aspirates are unacceptable for Xpert Xpress SARS-CoV-2/FLU/RSV testing.  Fact Sheet for Patients: BloggerCourse.com  Fact Sheet for Healthcare Providers: SeriousBroker.it  This test is not yet approved or cleared by the [place] FDA and has been authorized for detection and/or diagnosis of SARS-CoV-2 by FDA under an Emergency Use Authorization (EUA). This EUA will remain in effect (meaning  this test can be used) for the duration of the COVID-19 declaration under Section 564(b)(1) of the Act, 21 U.S.C. section 360bbb-3(b)(1), unless the authorization is terminated or revoked.  Performed at [facility], [address], [place], [place] [address]     Meredeth Ide   [facility] If 7PM-7AM, please contact night-coverage at [email or web address], Office  [phone]   09/11/2021, 2:06 PM  LOS: 2 days

## 2021-09-11 NOTE — Progress Notes (Addendum)
Advanced Heart Failure Rounding Note  PCP-Cardiologist: Nona Dell, MD  HF: Dr. Shirlee Latch  Patient Profile   Kevin Underwood is a 60 y.o. male with history of systolic HF/mixed ischemic nonischemic CM s/p HM III LVAD, CAD s/p CABG, paroxysmal atrial fibrillation (MAZE and LAA clipping at time of CABG), frequent PVCs on amiodarone. Now admitted with COPD exacerbation. Frequent PI events on device interrogation.  Subjective:   10/3 Kevin Underwood stopped due to frequent PI events/dizziness.  CCM - placed on triple Neb therapy and steroids. Started on loratidine.   Sats < 88% on room air. On 2 liters  sats >88%   Feels a little better.  Still having cough. No dizziness  VAD interrogation: Speed: 5600 Flow: 4.3  Power: 4 PI: 3.7---few PIs>7-8 > 100 PI events this am   Objective:   Weight Range: 85.5 kg Body mass index is 27.05 kg/m.   Vital Signs:   Temp:  [97.6 F (36.4 C)-98.1 F (36.7 C)] 97.8 F (36.6 C) (10/04 0449) Pulse Rate:  [84-110] 94 (10/04 0449) Resp:  [15-23] 16 (10/04 0449) BP: (102-126)/(67-99) 112/96 (10/04 0449) SpO2:  [90 %-97 %] 93 % (10/04 0449) Last BM Date: 09/11/21  Weight change: Filed Weights   09/08/21 2309 09/09/21 1028 09/10/21 0438  Weight: 90.7 kg 86.9 kg 85.5 kg    Intake/Output:   Intake/Output Summary (Last 24 hours) at 09/11/2021 0701 Last data filed at 09/10/2021 1750 Gross per 24 hour  Intake 200 ml  Output 600 ml  Net -400 ml      Physical Exam   Physical Exam: GENERAL: Sitting on the side of the bed. No acute distress. HEENT: normal  NECK: Supple, JVP flat  .  2+ bilaterally, no bruits.  No lymphadenopathy or thyromegaly appreciated.   CARDIAC:  Mechanical heart sounds with LVAD hum present.  LUNGS:  Coarse throughout. No wheeze ABDOMEN:  Soft, round, nontender, positive bowel sounds x4.     LVAD exit site: well-healed and incorporated.  Dressing dry and intact.  No erythema or drainage.  Stabilization device present and  accurately applied.  Driveline dressing is being changed daily per sterile technique. EXTREMITIES:  Warm and dry, no cyanosis, clubbing, rash or edema  NEUROLOGIC:  Alert and oriented x 3.    No aphasia.  No dysarthria.  Affect pleasant.       Telemetry  SR with occasional PVCs personally reviewed.    EKG    No new  Labs    CBC Recent Labs    09/09/21 0313 09/10/21 0049 09/11/21 0042  WBC 8.2 15.4* 13.2*  NEUTROABS 7.3  --   --   HGB 14.2 13.5 13.8  HCT 42.0 39.4 41.4  MCV 86.1 83.3 84.7  PLT 215 242 222   Basic Metabolic Panel Recent Labs    74/16/38 0313 09/10/21 0049 09/11/21 0042  NA 133* 133* 133*  K 3.8 3.9 4.0  CL 100 101 100  CO2 24 21* 23  GLUCOSE 309* 230* 228*  BUN 17 24* 32*  CREATININE 0.75 0.93 1.01  CALCIUM 8.6* 9.1 9.0  MG 2.1 2.2  --    Liver Function Tests Recent Labs    09/09/21 0313  AST 25  ALT 28  ALKPHOS 97  BILITOT 0.6  PROT 7.2  ALBUMIN 3.7   No results for input(s): LIPASE, AMYLASE in the last 72 hours. Cardiac Enzymes No results for input(s): CKTOTAL, CKMB, CKMBINDEX, TROPONINI in the last 72 hours.  BNP: BNP (last 3  results) Recent Labs    11/08/20 1344 11/18/20 0327 11/24/20 0045  BNP 910.0* 392.0* 252.0*    ProBNP (last 3 results) No results for input(s): PROBNP in the last 8760 hours.   D-Dimer No results for input(s): DDIMER in the last 72 hours. Hemoglobin A1C Recent Labs    09/09/21 0313  HGBA1C 8.9*   Fasting Lipid Panel Recent Labs    09/11/21 0042  CHOL 142  HDL 62  LDLCALC 61  TRIG 95  CHOLHDL 2.3   Thyroid Function Tests No results for input(s): TSH, T4TOTAL, T3FREE, THYROIDAB in the last 72 hours.  Invalid input(s): FREET3  Other results:   Imaging    No results found.   Medications:     Scheduled Medications:  amiodarone  200 mg Oral Daily   arformoterol  15 mcg Nebulization BID   budesonide (PULMICORT) nebulizer solution  0.25 mg Nebulization BID   doxycycline   100 mg Oral Q12H   gabapentin  600 mg Oral BID   guaiFENesin  600 mg Oral BID   insulin aspart  0-20 Units Subcutaneous TID WC   insulin aspart  0-5 Units Subcutaneous QHS   loratadine  10 mg Oral Daily   OLANZapine  5 mg Oral QHS   pantoprazole  40 mg Oral Daily   predniSONE  40 mg Oral Q breakfast   revefenacin  175 mcg Nebulization Daily   rosuvastatin  20 mg Oral Daily   sacubitril-valsartan  1 tablet Oral BID   sertraline  50 mg Oral Daily   traZODone  150 mg Oral QHS   Warfarin - Pharmacist Dosing Inpatient   Does not apply q1600    Infusions:   PRN Medications: acetaminophen **OR** [DISCONTINUED] acetaminophen, acetaminophen, morphine injection, ondansetron **OR** ondansetron (ZOFRAN) IV, oxyCODONE      Assessment/Plan   1. Chronic systolic CHF: Echo 10/21 with EF 20-25%, mildly decreased RV function. LHC/RHC in 12/21 with patent grafts, low output. Suspect mixed ischemic/nonischemic cardiomyopathy (prior heavy ETOH and drugs as well as CAD).  No ETOH, drugs, smoking since CABG in 12/20. Admitted with cardiogenic shock in 12/21, had placement of Impella 5.5 initially, now s/p Heartmate 3 LVAD on 11/17/20.   - Continues with frequent PI events . Dizziness improved. Now off farxiga.  - Encourage po hydration.  He does not need a diuretic.  - Maps running a little higher. Watch.  - Continue Entresto 24/26 bid.   - Off farxiga with frequent PIs.  - Continue Coumadin, INR goal 2-2.5.  -INR 2.8. Discussed with pharmacy.  - LDH 204 > 181>152  2. COPD exacerbation: Diffuse wheezing, improved with steroids and nebs.  No PNA on CXR. Symptoms are improving.  - On triple nebs + steroids.  - Follow up outpatient pulmonary. Obtain PFS and possible pulmonary rehab.  3. CAD: S/p CABG 12/20.  LHC pre-VAD with patent grafts, no target for intervention. - Continue statin. 4. Atrial fibrillation: Paroxysmal.  S/p Maze and LA appendage clip with CABG in 12/20.   - Maintaining NSR.  -  On amio . Cut back to 100 mg daily - On coumadin.  5. DM: SSI while inpatient.  -A1c 8.9 6. Prior ETOH, drugs: None x  >1 year.  7. Gout: Quiescent.  8. PACs/PVCs: - K stable   - Follow LFT and TSH with amiodarone, will also need regular eye exam and PFTs.  - Continue amiodarone to 100 mg daily. Consider stopping with ongoing lung issues.   Cardiac Rehab consult. Assess  saturations for home oxygen.   Length of Stay: 2  Kevin Becket, NP  09/11/2021, 7:01 AM  Advanced Heart Failure Team Pager [phone] (M-F; 7a - 5p)  Please contact [facility] for night-coverage after hours (5p -7a ) and weekends on [email or web address]  Patient seen with NP, agree with the above note.   Breathing significantly improved.  Seen by pulmonary, recommended inhaler regimen for COPD.  MAP is fairly variable but most recently 76.  Stable LDH.   Still with multiple PI events on LVAD interrogation.   General: Well appearing this am. NAD.  HEENT: Normal. Neck: Supple, JVP 7-8 cm. Carotids OK.  Cardiac:  Mechanical heart sounds with LVAD hum present.  Lungs:  Rhonchi, exam improved.  Abdomen:  NT, ND, no HSM. No bruits or masses. +BS  LVAD exit site: Well-healed and incorporated. Dressing dry and intact. No erythema or drainage. Stabilization device present and accurately applied. Driveline dressing changed daily per sterile technique. Extremities:  Warm and dry. No cyanosis, clubbing, rash, or edema.  Neuro:  Alert & oriented x 3. Cranial nerves grossly intact. Moves all 4 extremities w/o difficulty. Affect pleasant    Main issue here is COPD exacerbation.  He is improved, breathing better.  Suspect he will need oxygen at discharge.  Steroid and inhaler regimen per pulmonary and [facility].   From a cardiac standpoint, he continues to have frequent PI events.  MAP is not low.  We have stopped [place].  He is not volume overloaded. Continue lowest dose Entresto.   With significant COPD, I am going to decrease amiodarone  to 100 mg daily.  Will hopefully stop as outpatient.    I think he can go home today if [facility]/pulmonary agree.   Marca Ancona 09/11/2021 9:52 AM

## 2021-09-11 NOTE — Discharge Summary (Addendum)
Advanced Heart Failure Team  Discharge Summary   Patient ID: Kevin Underwood MRN: [id number], DOB/AGE: 03/02/1961 60 y.o. Admit date: 09/08/2021 D/C date:     09/11/2021   Primary Discharge Diagnoses:  1. Chronic systolic CHF 2. COPD exacerbation 3. CAD: S/p CABG 12/20 4. Atrial fibrillation: Paroxysmal.  S/p Maze and LA appendage clip with CABG in 12/20.   5. DM 6. Prior ETOH, drugs 7. Gout: Quiescent.  8. PACs/PVCs:  Hospital Course: Kevin Underwood is a 60 y.o. male with history of systolic HF/mixed ischemic nonischemic CM s/p HM III LVAD, CAD s/p CABG, paroxysmal atrial fibrillation (MAZE and LAA clipping at time of CABG), frequent PVCs on amiodarone.   Admitted with COPD exacerbation. Frequent PI events on device interrogation. Treated for COPD exacerbation. PCCM consulted with recommendations. Placed on triple nebs + steroids. Obtain PFTS and possible pulmonary rehab. Outpatient follow set up for pulmonary. Once PFTs obtained may need to stop amiodarone. Amiodarone dose cut back to 100 mg daily. Also check oxygen saturation with ambulation. Saturations > 90% with ambulation.   HF/VAD perspective farxiga stopped due to dizziness and hypotension. Symptoms resolved. Maps stable. INR followed by pharmacy with adjustments. Kevin Underwood will continue to check INR at home. Kevin Underwood will continue to be followed closely in the LVAD clinic .   See below for detailed problem list.  1. Chronic systolic CHF: Echo 33/54 with EF 20-25%, mildly decreased RV function. LHC/RHC in 12/21 with patent grafts, low output. Suspect mixed ischemic/nonischemic cardiomyopathy (prior heavy ETOH and drugs as well as CAD).  No ETOH, drugs, smoking since CABG in 12/20. Admitted with cardiogenic shock in 12/21, had placement of Impella 5.5 initially, now s/p Heartmate 3 LVAD on 11/17/20.   - Continues with frequent PI events . Dizziness improved after stopping farxiga.  - Encourage po hydration.  Kevin Underwood does not need a diuretic.  - Maps  running a little higher. Watch.  - Continue Entresto 24/26 bid.   - Off farxiga with frequent PIs.  - Continue Coumadin, INR goal 2-2.5.  -INR 2.8. Discussed with pharmacy.  - LDH stable.  2. COPD exacerbation: Diffuse wheezing, improved with steroids and nebs.  No PNA on CXR. Symptoms are improving.  - On triple nebs + steroids.  - Follow up outpatient pulmonary. Obtain PFS and possible pulmonary rehab. Also added loratadine.  3. CAD: S/p CABG 12/20.  LHC pre-VAD with patent grafts, no target for intervention. - Continue statin. 4. Atrial fibrillation: Paroxysmal.  S/p Maze and LA appendage clip with CABG in 12/20.   - Maintaining NSR.  - On amio . Cut back to 100 mg daily with COPD exacerbation.  - On coumadin.  5. DM: SSI while inpatient.  -A1c 8.9 6. Prior ETOH, drugs: None x  >1 year.  7. Gout: Quiescent.  8. PACs/PVCs: - K stable   - Follow LFT and TSH with amiodarone, will also need regular eye exam and PFTs.  - Continue amiodarone to 100 mg daily. Consider stopping with ongoing lung issues.    LVAD Interrogation HM III:   Speed: 5600   Flow:  4.4     PI:   2.9    Power:   4    Back-up speed:       Discharge Weight : 188 pounds  Discharge Vitals: Blood pressure (!) 112/96, pulse 92, temperature 97.8 F (36.6 C), temperature source Oral, resp. rate 16, height $RemoveBe'5\' 10"'xodWpFqrh$  (1.778 m), weight 85.5 kg, SpO2 93 %.  Labs: Lab Results  Component Value Date   WBC 13.2 (H) 09/11/2021   HGB 13.8 09/11/2021   HCT 41.4 09/11/2021   MCV 84.7 09/11/2021   PLT 222 09/11/2021    Recent Labs  Lab 09/09/21 0313 09/10/21 0049 09/11/21 0042  NA 133*   < > 133*  K 3.8   < > 4.0  CL 100   < > 100  CO2 24   < > 23  BUN 17   < > 32*  CREATININE 0.75   < > 1.01  CALCIUM 8.6*   < > 9.0  PROT 7.2  --   --   BILITOT 0.6  --   --   ALKPHOS 97  --   --   ALT 28  --   --   AST 25  --   --   GLUCOSE 309*   < > 228*   < > = values in this interval not displayed.   Lab Results  Component  Value Date   CHOL 142 09/11/2021   HDL 62 09/11/2021   LDLCALC 61 09/11/2021   TRIG 95 09/11/2021   BNP (last 3 results) Recent Labs    11/08/20 1344 11/18/20 0327 11/24/20 0045  BNP 910.0* 392.0* 252.0*    ProBNP (last 3 results) No results for input(s): PROBNP in the last 8760 hours.   Diagnostic Studies/Procedures   No results found.  Discharge Medications   Allergies as of 09/11/2021   No Known Allergies      Medication List     STOP taking these medications    Combivent Respimat 20-100 MCG/ACT Aers respimat Generic drug: Ipratropium-Albuterol   dapagliflozin propanediol 5 MG Tabs tablet Commonly known as: FARXIGA   doxycycline 100 MG capsule Commonly known as: VIBRAMYCIN   mometasone-formoterol 200-5 MCG/ACT Aero Commonly known as: DULERA   potassium chloride SA 20 MEQ tablet Commonly known as: KLOR-CON       TAKE these medications    albuterol (2.5 MG/3ML) 0.083% nebulizer solution Commonly known as: PROVENTIL Take 3 mLs (2.5 mg total) by nebulization every 6 (six) hours as needed for wheezing or shortness of breath.   amiodarone 200 MG tablet Commonly known as: PACERONE Take 0.5 tablets (100 mg total) by mouth daily. Take 1 pill twice daily for 10 days and then resume a dose of 1 pill daily What changed: how much to take   blood glucose meter kit and supplies Kit Dispense based on patient and insurance preference. Use to check CBG's three times a day. (FOR ICD-9 250.00, 250.01).   colchicine 0.6 MG tablet Take 1 tablet (0.6 mg total) by mouth as needed.   cyclobenzaprine 5 MG tablet Commonly known as: FLEXERIL Take 1-2 tablets (5-10 mg total) by mouth 2 (two) times daily as needed for muscle spasms.   gabapentin 600 MG tablet Commonly known as: NEURONTIN Take 600 mg by mouth 2 (two) times daily.   guaiFENesin 600 MG 12 hr tablet Commonly known as: MUCINEX Take 1 tablet (600 mg total) by mouth 2 (two) times daily.   loratadine 10  MG tablet Commonly known as: CLARITIN Take 1 tablet (10 mg total) by mouth daily. Start taking on: September 12, 2021   metFORMIN 1000 MG tablet Commonly known as: GLUCOPHAGE TAKE 1 TABLET (1,000 MG TOTAL) BY MOUTH IN THE MORNING AND AT BEDTIME.   multivitamin with minerals Tabs tablet Take 1 tablet by mouth daily.   OLANZapine 5 MG tablet Commonly known as: ZYPREXA Take 5 mg by mouth  at bedtime.   pantoprazole 40 MG tablet Commonly known as: PROTONIX Take 1 tablet (40 mg total) by mouth daily.   predniSONE 20 MG tablet Commonly known as: DELTASONE Take 2 tablets (40 mg total) by mouth daily with breakfast for 3 days. Start taking on: September 12, 2021   REDNESS RELIEVER EYE DROPS OP Apply 2 drops to eye daily as needed.   rosuvastatin 20 MG tablet Commonly known as: CRESTOR Take 20 mg by mouth daily.   sacubitril-valsartan 24-26 MG Commonly known as: ENTRESTO Take 1 tablet by mouth 2 (two) times daily.   sertraline 50 MG tablet Commonly known as: ZOLOFT TAKE 1 TABLET BY MOUTH EVERY DAY   traZODone 150 MG tablet Commonly known as: DESYREL TAKE 1 TABLET BY MOUTH AT BEDTIME.   Trelegy Ellipta 100-62.5-25 MCG/INH Aepb Generic drug: Fluticasone-Umeclidin-Vilant Inhale 1 puff into the lungs daily.   warfarin 4 MG tablet Commonly known as: COUMADIN Take as directed. If you are unsure how to take this medication, talk to your nurse or doctor. Original instructions: On 10/5 take 4 mg then resume 6 mg (1.5 tabs) on Mon/Wed/Fri and 4 mg (1 tab) all other days or as directed by HF Clinic Start taking on: September 12, 2021 What changed: additional instructions        Disposition   The patient will be discharged in stable condition to home. Discharge Instructions     (HEART FAILURE PATIENTS) Call MD:  Anytime you have any of the following symptoms: 1) 3 pound weight gain in 24 hours or 5 pounds in 1 week 2) shortness of breath, with or without a dry hacking cough 3)  swelling in the hands, feet or stomach 4) if you have to sleep on extra pillows at night in order to breathe.   Complete by: As directed    Diet - low sodium heart healthy   Complete by: As directed    Heart Failure patients record your daily weight using the same scale at the same time of day   Complete by: As directed    INR  Goal: 2 - 2.5   Complete by: As directed    Goal: 2 - 2.5   Increase activity slowly   Complete by: As directed    Page VAD Coordinator at [phone]  Notify for: any VAD alarms, sustained elevations of power >10 watts, sustained drop in Pulse Index <3   Complete by: As directed    Notify for:  any VAD alarms sustained elevations of power >10 watts sustained drop in Pulse Index <3     Speed Settings:   Complete by: As directed    Fixed 5600 RPM Low 5300 RPM          Duration of Discharge Encounter: Greater than 35 minutes   Signed, Amy Clegg NP_C  09/11/2021, 10:17 AM  Patient seen with NP, agree with the above note.   Kevin Underwood can go home today.  Followup with pulmonary and in LVAD clinic. Will stay off [place] for now.   Loralie Champagne 09/11/2021

## 2021-09-11 NOTE — Progress Notes (Signed)
CARDIAC REHAB PHASE I   PRE:  Rate/Rhythm: 81 SR    BP: sitting 88 dopplared    SaO2: 92 1 1/2L  MODE:  Ambulation: 800 ft   POST:  Rate/Rhythm: 105 ST    BP: sitting 104 dopplar     SaO2: 95 RA  Pt feeling well, able to walk on RA, SaO2 maintained 95 RA during walk. No significant SOB. Left on RA in recliner, saO2 92 RA. Encouraged walking, stationary bike, and Cardiac/pulmonary rehab (pt has not been interested in the past but they called him last week when he was getting sick). [phone]  Harriet Masson CES, ACSM 09/11/2021 8:40 AM

## 2021-09-11 NOTE — Progress Notes (Signed)
ANTICOAGULATION CONSULT NOTE   Pharmacy Consult for warfarin Indication: LVAD (HM3)  No Known Allergies  Patient Measurements: Height: 5\' 10"  (177.8 cm) Weight: 85.5 kg (188 lb 7.9 oz) IBW/kg (Calculated) : 73 Heparin Dosing Weight:   Vital Signs: Temp: 97.8 F (36.6 C) (10/04 0449) Temp Source: Oral (10/04 0449) BP: 112/96 (10/04 0449) Pulse Rate: 92 (10/04 0843)  Labs: Recent Labs    09/08/21 2315 09/09/21 0132 09/09/21 0313 09/09/21 1816 09/10/21 0049 09/11/21 0042  HGB 14.6  --  14.2  --  13.5 13.8  HCT 43.8  --  42.0  --  39.4 41.4  PLT 218  --  215  --  242 222  LABPROT 29.4*  --   --  29.3* 29.5* 29.2*  INR 2.8*  --   --  2.8* 2.8* 2.8*  CREATININE 0.89  --  0.75  --  0.93 1.01  TROPONINIHS 11 11  --   --   --   --      Estimated Creatinine Clearance: 80.3 mL/min (by C-G formula based on SCr of 1.01 mg/dL).   Medical History: Past Medical History:  Diagnosis Date   Arthritis    CAD (coronary artery disease)    a. s/p CABG in 11/2019 with LIMA-LAD, SVG-OM1, SVG-PDA and SVG-D1   CHF (congestive heart failure) (HCC)    a. EF < 20% by echo in 11/2019   COPD (chronic obstructive pulmonary disease) (HCC)    Essential hypertension    PAF (paroxysmal atrial fibrillation) (HCC)    Type 2 diabetes mellitus (HCC)     Medications:  Medications Prior to Admission  Medication Sig Dispense Refill Last Dose   albuterol (PROVENTIL) (2.5 MG/3ML) 0.083% nebulizer solution Take 3 mLs (2.5 mg total) by nebulization every 6 (six) hours as needed for wheezing or shortness of breath. 75 mL 6 09/08/2021   amiodarone (PACERONE) 200 MG tablet Take 1 tablet (200 mg total) by mouth daily. Take 1 pill twice daily for 10 days and then resume a dose of 1 pill daily 90 tablet 3 09/08/2021   COMBIVENT RESPIMAT 20-100 MCG/ACT AERS respimat Inhale 1 puff into the lungs every 6 (six) hours as needed. 4 g 1 09/08/2021   dapagliflozin propanediol (FARXIGA) 5 MG TABS tablet Take 1 tablet (5  mg total) by mouth daily before breakfast. (Patient taking differently: Take 10 mg by mouth daily before breakfast.) 30 tablet 3 09/08/2021   gabapentin (NEURONTIN) 600 MG tablet Take 600 mg by mouth 2 (two) times daily.   09/08/2021   metFORMIN (GLUCOPHAGE) 1000 MG tablet TAKE 1 TABLET (1,000 MG TOTAL) BY MOUTH IN THE MORNING AND AT BEDTIME. 180 tablet 3 09/08/2021   Multiple Vitamin (MULTIVITAMIN WITH MINERALS) TABS tablet Take 1 tablet by mouth daily.   09/08/2021   OLANZapine (ZYPREXA) 5 MG tablet Take 5 mg by mouth at bedtime.   09/07/2021   pantoprazole (PROTONIX) 40 MG tablet Take 1 tablet (40 mg total) by mouth daily. 30 tablet 6 09/08/2021   rosuvastatin (CRESTOR) 20 MG tablet Take 20 mg by mouth daily.   09/08/2021   sacubitril-valsartan (ENTRESTO) 24-26 MG Take 1 tablet by mouth 2 (two) times daily. 60 tablet 6 09/08/2021   sertraline (ZOLOFT) 50 MG tablet TAKE 1 TABLET BY MOUTH EVERY DAY (Patient taking differently: Take 50 mg by mouth daily.) 90 tablet 3 09/08/2021   Tetrahydrozoline HCl (REDNESS RELIEVER EYE DROPS OP) Apply 2 drops to eye daily as needed.   Past Month  traZODone (DESYREL) 150 MG tablet TAKE 1 TABLET BY MOUTH AT BEDTIME. (Patient taking differently: Take 150 mg by mouth at bedtime.) 90 tablet 3 09/07/2021   warfarin (COUMADIN) 4 MG tablet Take 6 mg (1.5 tabs) on Mon/Wed/Fri and 4 mg (1 tab) all other days or as directed by HF Clinic 60 tablet 11 09/08/2021   blood glucose meter kit and supplies KIT Dispense based on patient and insurance preference. Use to check CBG's three times a day. (FOR ICD-9 250.00, 250.01). 1 each 0    colchicine 0.6 MG tablet Take 1 tablet (0.6 mg total) by mouth as needed. (Patient not taking: No sig reported) 60 tablet 3 Not Taking   cyclobenzaprine (FLEXERIL) 5 MG tablet Take 1-2 tablets (5-10 mg total) by mouth 2 (two) times daily as needed for muscle spasms. (Patient not taking: No sig reported) 45 tablet 3 Completed Course   doxycycline (VIBRAMYCIN)  100 MG capsule Take 1 capsule (100 mg total) by mouth 2 (two) times daily. (Patient not taking: Reported on 09/09/2021) 14 capsule 0 Completed Course   mometasone-formoterol (DULERA) 200-5 MCG/ACT AERO Inhale 2 puffs into the lungs in the morning and at bedtime. (Patient not taking: Reported on 09/09/2021) 1 each 2 Not Taking   potassium chloride SA (KLOR-CON) 20 MEQ tablet Take 2 tablets (40 mEq total) by mouth daily. (Patient not taking: No sig reported) 60 tablet 3 Not Taking    Assessment: Pharmacy consulted to dose warfarin in patient with LVAD.  Patient's INR on admission is 2.8, and is unchanged this morning. Patient admitted with COPD exacerbation and started on doxycycline 100 mg q12h on 10/2. The doxycycline can additionally increase INR.    Home dose listed as 6 mg on MWF and 4 mg ROW  per anti-coag clinic note. Last dose PTA was 10/1 at 1800.   Goal of Therapy:  INR 2-2.5 Monitor platelets by anticoagulation protocol: Yes   Plan:  Warfarin $RemoveB'2mg'PtaXgDSA$  this morning prior to d/c For home regimen will have patient take $RemoveBefo'4mg'tEChdAeclnE$  tomorrow and then resume prior home dose. Note that his amiodarone was restarted $RemoveBefor'100mg'EvloBAnYMNUu$  daily  Erin Hearing PharmD., BCPS Clinical Pharmacist 09/11/2021 9:39 AM

## 2021-09-11 NOTE — Plan of Care (Signed)
  Problem: Education: Goal: Knowledge of General Education information will improve Description: Including pain rating scale, medication(s)/side effects and non-pharmacologic comfort measures Outcome: Progressing   Problem: Health Behavior/Discharge Planning: Goal: Ability to manage health-related needs will improve Outcome: Progressing   Problem: Clinical Measurements: Goal: Ability to maintain clinical measurements within normal limits will improve Outcome: Progressing Goal: Will remain free from infection Outcome: Progressing Goal: Diagnostic test results will improve Outcome: Progressing Goal: Respiratory complications will improve Outcome: Progressing Goal: Cardiovascular complication will be avoided Outcome: Progressing   Problem: Activity: Goal: Risk for activity intolerance will decrease Outcome: Progressing   Problem: Nutrition: Goal: Adequate nutrition will be maintained Outcome: Progressing   Problem: Coping: Goal: Level of anxiety will decrease Outcome: Progressing   Problem: Elimination: Goal: Will not experience complications related to bowel motility Outcome: Progressing Goal: Will not experience complications related to urinary retention Outcome: Progressing   Problem: Pain Managment: Goal: General experience of comfort will improve Outcome: Progressing   Problem: Safety: Goal: Ability to remain free from injury will improve Outcome: Progressing   Problem: Skin Integrity: Goal: Risk for impaired skin integrity will decrease Outcome: Progressing   Problem: Education: Goal: Knowledge of disease or condition will improve Outcome: Progressing Goal: Knowledge of the prescribed therapeutic regimen will improve Outcome: Progressing Goal: Individualized Educational Video(s) Outcome: Progressing   Problem: Activity: Goal: Ability to tolerate increased activity will improve Outcome: Progressing Goal: Will verbalize the importance of balancing  activity with adequate rest periods Outcome: Progressing   Problem: Respiratory: Goal: Ability to maintain a clear airway will improve Outcome: Progressing Goal: Levels of oxygenation will improve Outcome: Progressing Goal: Ability to maintain adequate ventilation will improve Outcome: Progressing   Problem: Education: Goal: Patient will understand all VAD equipment and how it functions Outcome: Progressing Goal: Patient will be able to verbalize current INR target range and antiplatelet therapy for discharge home Outcome: Progressing   Problem: Cardiac: Goal: LVAD will function as expected and patient will experience no clinical alarms Outcome: Progressing   

## 2021-09-11 NOTE — Progress Notes (Addendum)
LVAD Coordinator Rounding Note:  Admitted 09/09/21 to Dr Claris Gladden service due to COPD exacerbation.   Pt reports he has been experiencing increased exertional dyspnea, wheezing, and coughing for the last 1.5 weeks. He felt increasingly worse on Saturday night so he presented to [facility]. Reports lightheadedness with coughing episodes. COVID and Influenza negative.  HM III LVAD implanted on 11/17/20 by Dr Orvan Seen under destination therapy criteria.   Pt walking around in his room upon my arrival. States he feels better, but is still wheezy intermittently. Denies dizziness or lightheadedness other than with coughing episodes. Denies signs of bleeding. 100+ PI events noted on interrogation. Pt asymptomatic. BP well managed. Encouraged to increase PO intake.   Occassionally at rest O2 sats drop to 88-90%. Pt ambulated in hall with cardiac rehab team. Sats remained in the 90s with ambulation. Ok to discharge home per pulmonary team and Dr Aundra Dubin.   Pulmonary team consulted yesterday. Plan for Trelegy 100/62.5/25 1 puff daily with PRN albuterol, with Zyrtec at discharge. He will continue Prednisone for 4 more days. Per pulm rounding note today: Will arrange 4-6 week f/u with Dr. Erin Fulling for TH2 testing and full PFTs.  Plan to discharge home today. VAD clinic f/u scheduled for 09/18/21 at 11:00. Provided with 8 weekly kits for home use. Discussed new Silverlon patch in weekly kits with patient's wife Linus Orn. Provided with 8 weekly kits.   Vital signs: Temp: 97.8 HR: 79 Doppler Pressure: 98 Automatic BP: 93/67 (76) O2 Sat: 92% on RA Wt: 188.5>188.4 lbs     LVAD interrogation reveals:  Speed: 5600 Flow: 4.5 Power:  4.4 w PI: 3.0  Hct: 41   Alarms: none Events: 100+ today  Fixed speed: 5600 Low speed limit: 5300   Drive Line: Will advance to weekly dressing changes by bedside RN, or patient's wife, using weekly kit. Next dressing change due 09/17/21.   Labs:  LDH trend:  181>152  INR trend: 2.8>2.8  WBC: 8.2>15.4>13.2  Anticoagulation Plan: -INR Goal: 2.0 - 2.5 -ASA Dose: none  Plan/Recommendations:   1. Page VAD coordinator for VAD equipment or drive line issues 2. Weekly drive line dressing changes per bedside RN, or patient's wife  Emerson Monte RN [place] Coordinator  Office: [phone]  24/7 Pager: [phone]

## 2021-09-11 NOTE — Progress Notes (Signed)
09/11/2021  I have seen and evaluated the patient for COPD  S:  Feeling better. Wants to go home. Walked on RA, no desats.  O: Blood pressure (!) 112/96, pulse 92, temperature 97.8 F (36.6 C), temperature source Oral, resp. rate 16, height 5\' 10"  (1.778 m), weight 85.5 kg, SpO2 93 %.  No distress Continued bet less wheezing Moves all 4 ext LVAD hum on cardiac ausculation  A:  AECOPD- recurrent issue; may be problem with maintenance inhalers +/- element of TH2 overlay   40+ year packing history-LVAD precludes any benefit to LDCT screening   DM2 with hyperglycmeia  P:  Discussed with PharmD- trelegy is covered: DC his home combivent; he can use trelegy 100/62.5/25 1 puff daily with PRN albuterol; order him script for zyrtec as well  4 more days 40 mg prednisone, no need to send with doxycycline  Will arrange 4-6 week f/u with Dr. 06-15-1998 for TH2 testing and full PFTs  Stable for DC from our standpoint, relayed to primary, available PRN  Francine Graven MD [facility] Prefer epic messenger for cross cover needs If after hours, please call E-link

## 2021-09-12 ENCOUNTER — Telehealth: Payer: Self-pay | Admitting: Pulmonary Disease

## 2021-09-12 NOTE — Telephone Encounter (Signed)
f/u appt with [facility] Received: Tenny Craw, MD  P [facility] Triage Pool Cc: Martina Sinner, MD Hospital f/u, establish care for COPD, VAD patient. 4-6 weeks fine, thanks    Called and spoke with pt's wife and have scheduled pt a HFU with Dr. Francine Graven. Provided her with our office address. Nothing further needed.

## 2021-09-13 ENCOUNTER — Encounter (HOSPITAL_COMMUNITY): Payer: 59

## 2021-09-17 ENCOUNTER — Telehealth (HOSPITAL_COMMUNITY): Payer: Self-pay | Admitting: Licensed Clinical Social Worker

## 2021-09-17 NOTE — Telephone Encounter (Signed)
CSW called pt to confirm that he is aware of his appt for tomorrow and has transportation to get here as that is why he reportedly missed previous appts.  Pt wife answered and confirmed they are aware of appt and that she is driving him so no barriers to attending appt identified at this time  Will continue to follow and assist as needed  Burna Sis, LCSW Clinical Social Worker [facility] Desk#: [phone] Cell#: [phone]

## 2021-09-18 ENCOUNTER — Other Ambulatory Visit: Payer: Self-pay

## 2021-09-18 ENCOUNTER — Other Ambulatory Visit (HOSPITAL_COMMUNITY): Payer: Self-pay | Admitting: *Deleted

## 2021-09-18 ENCOUNTER — Encounter (HOSPITAL_COMMUNITY): Payer: Self-pay

## 2021-09-18 ENCOUNTER — Ambulatory Visit (HOSPITAL_COMMUNITY)
Admit: 2021-09-18 | Discharge: 2021-09-18 | Disposition: A | Discharge status: Home / Self Care | Payer: 59 | Attending: Cardiology | Admitting: Cardiology

## 2021-09-18 ENCOUNTER — Ambulatory Visit (HOSPITAL_COMMUNITY): Payer: Self-pay | Admitting: Pharmacist

## 2021-09-18 DIAGNOSIS — J449 Chronic obstructive pulmonary disease, unspecified: Secondary | ICD-10-CM | POA: Diagnosis not present

## 2021-09-18 DIAGNOSIS — M109 Gout, unspecified: Secondary | ICD-10-CM | POA: Diagnosis not present

## 2021-09-18 DIAGNOSIS — E119 Type 2 diabetes mellitus without complications: Secondary | ICD-10-CM | POA: Diagnosis not present

## 2021-09-18 DIAGNOSIS — Z95811 Presence of heart assist device: Secondary | ICD-10-CM | POA: Insufficient documentation

## 2021-09-18 DIAGNOSIS — I110 Hypertensive heart disease with heart failure: Secondary | ICD-10-CM | POA: Insufficient documentation

## 2021-09-18 DIAGNOSIS — I5022 Chronic systolic (congestive) heart failure: Secondary | ICD-10-CM | POA: Insufficient documentation

## 2021-09-18 DIAGNOSIS — Z7952 Long term (current) use of systemic steroids: Secondary | ICD-10-CM | POA: Diagnosis not present

## 2021-09-18 DIAGNOSIS — Z79899 Other long term (current) drug therapy: Secondary | ICD-10-CM | POA: Insufficient documentation

## 2021-09-18 DIAGNOSIS — Z452 Encounter for adjustment and management of vascular access device: Secondary | ICD-10-CM | POA: Insufficient documentation

## 2021-09-18 DIAGNOSIS — I2510 Atherosclerotic heart disease of native coronary artery without angina pectoris: Secondary | ICD-10-CM | POA: Insufficient documentation

## 2021-09-18 DIAGNOSIS — I480 Paroxysmal atrial fibrillation: Secondary | ICD-10-CM | POA: Insufficient documentation

## 2021-09-18 DIAGNOSIS — I493 Ventricular premature depolarization: Secondary | ICD-10-CM | POA: Diagnosis not present

## 2021-09-18 DIAGNOSIS — Z7901 Long term (current) use of anticoagulants: Secondary | ICD-10-CM | POA: Diagnosis not present

## 2021-09-18 DIAGNOSIS — Z951 Presence of aortocoronary bypass graft: Secondary | ICD-10-CM | POA: Diagnosis not present

## 2021-09-18 DIAGNOSIS — Z7951 Long term (current) use of inhaled steroids: Secondary | ICD-10-CM | POA: Insufficient documentation

## 2021-09-18 DIAGNOSIS — Z7984 Long term (current) use of oral hypoglycemic drugs: Secondary | ICD-10-CM | POA: Insufficient documentation

## 2021-09-18 LAB — CBC
HCT: 40.9 % (ref 39.0–52.0)
Hemoglobin: 13.6 g/dL (ref 13.0–17.0)
MCH: 28.3 pg (ref 26.0–34.0)
MCHC: 33.3 g/dL (ref 30.0–36.0)
MCV: 85 fL (ref 80.0–100.0)
Platelets: 192 10*3/uL (ref 150–400)
RBC: 4.81 MIL/uL (ref 4.22–5.81)
RDW: 13.5 % (ref 11.5–15.5)
WBC: 8.5 10*3/uL (ref 4.0–10.5)
nRBC: 0 % (ref 0.0–0.2)

## 2021-09-18 LAB — PROTIME-INR
INR: 1.7 — ABNORMAL HIGH (ref 0.8–1.2)
Prothrombin Time: 20.1 seconds — ABNORMAL HIGH (ref 11.4–15.2)

## 2021-09-18 LAB — BASIC METABOLIC PANEL
Anion gap: 8 (ref 5–15)
BUN: 14 mg/dL (ref 6–20)
CO2: 28 mmol/L (ref 22–32)
Calcium: 9 mg/dL (ref 8.9–10.3)
Chloride: 101 mmol/L (ref 98–111)
Creatinine, Ser: 0.76 mg/dL (ref 0.61–1.24)
GFR, Estimated: >60 mL/min (ref >60)
Glucose, Bld: 295 mg/dL — ABNORMAL HIGH (ref 70–99)
Potassium: 3.8 mmol/L (ref 3.5–5.1)
Sodium: 137 mmol/L (ref 135–145)

## 2021-09-18 LAB — LACTATE DEHYDROGENASE: LDH: 143 U/L (ref 98–192)

## 2021-09-18 NOTE — Patient Instructions (Signed)
No medication changes today Coumadin dosing per Lauren PharmD Return to VAD clinic in 1 month We will get you scheduled for PFTs (pulmonary function test). You will need COVID screening prior to

## 2021-09-18 NOTE — Progress Notes (Signed)
LVAD INR 

## 2021-09-18 NOTE — Progress Notes (Addendum)
Patient presents for hospital d/c follow up in [facility] today with his wife [name]. Reports no problems with VAD equipment or concerns with drive line.  Patient reports he has been feeling much better since hospital discharge. Denies lightheadedness, dizziness, falls, heart failure symptoms, shortness of breath, or signs of bleeding.   Pt's weight is up 12 lbs today from hospital d/c weight. Weighed with equipment and is wearing heavy work boots. Reports he has been eating a lot due to Prednisone. Completed Prednisone taper over the weekend. No heart failure symptoms noted.   Lungs mildly wheezy upon ausculation. Denies shortness of breath. Reports cough has resolved. He is using his Trelegy inhaler and PRN Albuterol as prescribed. He has follow up with pulmonology 10/10/21 in their [facility] office. Per Dr Aundra Dubin will schedule patient for PFTs at [facility].   Vital Signs:  Doppler Pressure: 82 Automatc BP: 115/74 (95) HR: 95 SPO2: 98    Weight: 201.8 lb w/ eqt Last weight: 188 lb (hospital discharge)  Home weights: 175 - 180 lbs    VAD Indication: Destination Therapy. HM III implanted 11/17/20 by Dr Orvan Seen   VAD interrogation: Speed: 5600 Flow: 4.4 Power: 4.4 w    PI: 2.8 Hct: 44  Alarms: 1none  Events: 75 + daily  Fixed speed: 5500 Low speed limit: 5200  Primary controller back up battery due for replacement in 22 months. Back up controller back up battery due for change in 20 months. - not present today   I reviewed the LVAD parameters from today and compared the results to the patient's prior recorded data. LVAD interrogation was NEGATIVE for significant power changes, NEGATIVE for clinical alarms and STABLE for PI events/speed drops. No programming changes were made and pump is functioning within specified parameters. Pt is performing daily controller and system monitor self tests along with completing weekly and monthly maintenance for LVAD equipment.   LVAD equipment  check completed and is in good working order. Back-up equipment present. Charged back up battery and performed self-test on equipment.    Annual Equipment Maintenance on UBC/PM was performed on 11/30/2020.   Exit Site Care: Drive line is being maintained weekly by patient's wife [name]. Provided with 12 weekly kits and 10 anchors for home use.   BP & Labs:  Doppler 82 - Doppler is reflecting MAP   Hgb 13.6 - No S/S of bleeding. Specifically denies melena/BRBPR or nosebleeds. Occasional blood when wiping.    LDH 143 -  with established baseline of 190 - 360. Denies tea-colored urine. No power elevations noted on interrogation.     Plan: No medication changes today Coumadin dosing per Lauren PharmD Return to [facility] in 1 month We will get you scheduled for PFTs (pulmonary function test). You will need COVID screening prior to PFTs  Emerson Monte RN [place] Coordinator  Office: [phone]  24/7 Pager: [phone]   Follow up for Heart Failure/LVAD:  60 y.o. 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 type 2 diabetes mellitus, COPD, and hypertension. Now s/p Heartmate 3 LVAD.   Patient had echo in 10/21 with EF 20-25%.  He was admitted in 12/21 with cardiogenic shock.  Cath showed patent grafts and low cardiac output.  Suspect mixed ischemic/nonischemic cardiomyopathy, prior heavy ETOH though no ETOH or smoking since 12/20 CABG.  He required stabilization with Impella 5.5 and  ultimately had placement of Heartmate 3 LVAD.  Paroxysmal atrial fibrillation noted post-op, but generally stable course and discharged home.    Patient was admitted in 10/22 with COPD exacerbation.  Wilder Glade was stopped with frequent PIs.  He was treated with steroids, nebs, antibiotics with improvement.   Patient returns for LVAD followup.  He feels  a lot better.  He thinks that Trelegy helped his breathing a lot. Still with frequent PI events on device interrogation. Still has occasional wheezing.  No dyspnea walking on flat ground.  No lightheadedness.  No further coughing. MAP stable at 82 today.   Denies LVAD alarms.  Reports taking Coumadin as prescribed and adherence to anticoagulation based dietary restrictions.  Denies bright red blood per rectum or melena, no dark urine or hematuria.    Labs (12/21): K 4.1, creatinine 0.73, INR 2.4, hgb 12.3, LDH 194, LFTs normal, TSH normal Labs (1/22): hgb 14.4, creatinine 0.91, LDH 197 Labs (5/22): K 3.8, creatinine 1.44, hgb 15, LDH 177 Labs (10/22): K 4, creatinine 1.01, hgb 13.8, LDL 61, LDH 152   Past Medical History:  Diagnosis Date   Arthritis    CAD (coronary artery disease)    a. s/p CABG in 11/2019 with LIMA-LAD, SVG-OM1, SVG-PDA and SVG-D1   CHF (congestive heart failure) (HCC)    a. EF < 20% by echo in 11/2019   COPD (chronic obstructive pulmonary disease) (HCC)    Essential hypertension    PAF (paroxysmal atrial fibrillation) (HCC)    Type 2 diabetes mellitus (HCC)     Current Outpatient Medications  Medication Sig Dispense Refill   albuterol (PROVENTIL) (2.5 MG/3ML) 0.083% nebulizer solution Take 3 mLs (2.5 mg total) by nebulization every 6 (six) hours as needed for wheezing or shortness of breath. 75 mL 6   amiodarone (PACERONE) 200 MG tablet Take 0.5 tablets (100 mg total) by mouth daily. Take 1 pill twice daily for 10 days and then resume a dose of 1 pill daily 30 tablet 3   blood glucose meter kit and supplies KIT Dispense based on patient and insurance preference. Use to check CBG's three times a day. (FOR ICD-9 250.00, 250.01). 1 each 0   Fluticasone-Umeclidin-Vilant (TRELEGY ELLIPTA) 100-62.5-25 MCG/INH AEPB Inhale 1 puff into the lungs daily. 60 each 6   gabapentin (NEURONTIN) 600 MG tablet Take 600 mg by mouth 2 (two) times daily.     guaiFENesin (MUCINEX) 600 MG  12 hr tablet Take 1 tablet (600 mg total) by mouth 2 (two) times daily. 60 tablet 6   loratadine (CLARITIN) 10 MG tablet Take 1 tablet (10 mg total) by mouth daily. 30 tablet 6   metFORMIN (GLUCOPHAGE) 1000 MG tablet TAKE 1 TABLET (1,000 MG TOTAL) BY MOUTH IN THE MORNING AND AT BEDTIME. 180 tablet 3   Multiple Vitamin (MULTIVITAMIN WITH MINERALS) TABS tablet Take 1 tablet by mouth daily.     OLANZapine (ZYPREXA) 5 MG tablet Take 5 mg by mouth at bedtime.     pantoprazole (PROTONIX) 40 MG tablet Take 1 tablet (40 mg total) by mouth daily. 30 tablet 6   rosuvastatin (CRESTOR) 20 MG tablet Take 20 mg by mouth daily.     sacubitril-valsartan (ENTRESTO) 24-26 MG Take 1 tablet by mouth 2 (two) times daily. 60 tablet 6   sertraline (ZOLOFT) 50 MG tablet TAKE 1 TABLET BY MOUTH EVERY DAY (Patient taking differently: Take 50 mg by mouth daily.) 90 tablet 3   Tetrahydrozoline HCl (REDNESS RELIEVER EYE DROPS OP) Apply 2  drops to eye daily as needed.     traZODone (DESYREL) 150 MG tablet TAKE 1 TABLET BY MOUTH AT BEDTIME. (Patient taking differently: Take 150 mg by mouth at bedtime.) 90 tablet 3   warfarin (COUMADIN) 4 MG tablet On 10/5 take 4 mg then resume 6 mg (1.5 tabs) on Mon/Wed/Fri and 4 mg (1 tab) all other days or as directed by HF Clinic 60 tablet 11   colchicine 0.6 MG tablet Take 1 tablet (0.6 mg total) by mouth as needed. (Patient not taking: No sig reported) 60 tablet 3   cyclobenzaprine (FLEXERIL) 5 MG tablet Take 1-2 tablets (5-10 mg total) by mouth 2 (two) times daily as needed for muscle spasms. (Patient not taking: No sig reported) 45 tablet 3   No current facility-administered medications for this encounter.    Patient has no known allergies.  REVIEW OF SYSTEMS: All systems negative except as listed in HPI, PMH and Problem list.   LVAD INTERROGATION:  See nurse's note above.   I reviewed the LVAD parameters from today, and compared the results to the patient's prior recorded data.   No programming changes were made.  The LVAD is functioning within specified parameters.  The patient performs LVAD self-test daily.  LVAD interrogation was negative for any significant power changes, alarms or PI events/speed drops.  LVAD equipment check completed and is in good working order.  Back-up equipment present.   LVAD education done on emergency procedures and precautions and reviewed exit site care.    Vitals:   09/18/21 1100 09/18/21 1105  BP: (!) 82/0 115/74  Pulse: 95   Temp: 97.8 F (36.6 C)   SpO2: 98%   Weight: 91.5 kg (201 lb 12.8 oz)   MAP 82  Physical Exam: General: Well appearing this am. NAD.  HEENT: Normal. Neck: Supple, JVP 7-8 cm. Carotids OK.  Cardiac:  Mechanical heart sounds with LVAD hum present.  Lungs:  Bilateral wheezes Abdomen:  NT, ND, no HSM. No bruits or masses. +BS  LVAD exit site: Well-healed and incorporated. Dressing dry and intact. No erythema or drainage. Stabilization device present and accurately applied. Driveline dressing changed daily per sterile technique. Extremities:  Warm and dry. No cyanosis, clubbing, rash, or edema.  Neuro:  Alert & oriented x 3. Cranial nerves grossly intact. Moves all 4 extremities w/o difficulty. Affect pleasant    ASSESSMENT AND PLAN: 1. Chronic systolic CHF: Echo 97/94 with EF 20-25%, mildly decreased RV function. LHC/RHC in 12/21 with patent grafts, low output. Suspect mixed ischemic/nonischemic cardiomyopathy (prior heavy ETOH and drugs as well as CAD).  No ETOH, drugs, smoking since CABG in 12/20. Admitted with cardiogenic shock in 12/21, had placement of Impella 5.5 initially, now s/p Heartmate 3 LVAD on 11/17/20.  NYHA class II.  Not volume overloaded on exam. MAP stable.  - Continue Entresto 24/26 bid.   - Continue Coumadin, INR goal 2-2.5.  2. CAD: S/p CABG 12/20.  LHC pre-VAD with patent grafts, no target for intervention. - Continue statin, good lipids in 10/22. 3. Atrial fibrillation: Paroxysmal.  S/p  Maze and LA appendage clip with CABG in 12/20.  In NSR today.  4. Type 2 diabetes: Close followup with PCP.   5. Prior ETOH, drugs: None x  >1 year.  6. Gout: Can use colchicine prn.  7. PACs/PVCs: Appear to be resolved now that he is back on amiodarone.  - Follow LFT and TSH with amiodarone, will also need regular eye exam.  8. COPD: Recent  COPD exacerbation, overall feels better on Trelegy.  - He has appointment soon with pulmonary.   - Arrange for full PFTs prior to pulmonary appointment.   Followup in 6 wks.   Loralie Champagne 09/18/2021

## 2021-09-19 ENCOUNTER — Encounter (HOSPITAL_COMMUNITY): Payer: Self-pay | Admitting: *Deleted

## 2021-09-19 ENCOUNTER — Other Ambulatory Visit (HOSPITAL_COMMUNITY): Payer: Self-pay | Admitting: *Deleted

## 2021-09-19 ENCOUNTER — Telehealth (HOSPITAL_COMMUNITY): Payer: Self-pay | Admitting: *Deleted

## 2021-09-19 NOTE — Telephone Encounter (Signed)
Spoke with patient's wife Kennith Center re: PFTs.   Pt scheduled for pre-procedure COVID testing at [facility] 09/28/21 at 12:30 in short stay. Pt scheduled for PFTs at [facility] 10/02/21 at 11:00. Instructed to arrive in short stay at 10:45. He may not have caffeine, smoke, or use inhalers 4 hours prior to test.   Kennith Center verbalized understanding of all the above instructions. Instructed to call VAD coordinators with any questions. Letter sent to patient with the above appointment information.   Alyce Pagan RN VAD Coordinator  Office: [phone]  24/7 Pager: [phone]

## 2021-09-19 NOTE — Progress Notes (Signed)
error 

## 2021-09-26 ENCOUNTER — Telehealth (HOSPITAL_COMMUNITY): Payer: Self-pay | Admitting: Pharmacist

## 2021-09-26 NOTE — Telephone Encounter (Signed)
Patient is due for an INR check. Usually checks on his him INR machine every Monday or Tuesday. Attempted to call patient but patient did not answer and unable to leave VM.   Karle Plumber, PharmD, BCPS, BCCP, CPP Heart Failure Clinic Pharmacist [phone]

## 2021-09-28 ENCOUNTER — Other Ambulatory Visit (HOSPITAL_COMMUNITY)
Admission: RE | Admit: 2021-09-28 | Discharge: 2021-09-28 | Disposition: A | Discharge status: Home / Self Care | Payer: 59 | Source: Ambulatory Visit | Attending: Cardiology | Admitting: Cardiology

## 2021-09-28 DIAGNOSIS — Z01818 Encounter for other preprocedural examination: Secondary | ICD-10-CM

## 2021-10-02 ENCOUNTER — Ambulatory Visit (HOSPITAL_COMMUNITY): Admission: RE | Admit: 2021-10-02 | Payer: 59 | Source: Ambulatory Visit

## 2021-10-10 ENCOUNTER — Inpatient Hospital Stay: Payer: 59 | Admitting: Pulmonary Disease

## 2021-10-10 NOTE — Progress Notes (Deleted)
Synopsis: Referred in November 2022 for COPD  Subjective:   PATIENT ID: Kevin Underwood GENDER: male DOB: 09-21-1961, MRN: [id number]   HPI  No chief complaint on file.  Kevin Underwood is a 60 year old male, former smoker with systolic heart failure (mixed ischemic/non-ischemic), LVAD, CAD s/p CABG, paroxysmal atrial fibrillation, DMII and COPD who is referred to pulmonary clinic for hospital follow up of COPD exacerbation.  He was admitted 10/02 to 10/04 for COPD exacerbation. He was treated with ICS/LABA/LAMA nebulizers along with prednisone and azithromycin.   Past Medical History:  Diagnosis Date   Arthritis    CAD (coronary artery disease)    a. s/p CABG in 11/2019 with LIMA-LAD, SVG-OM1, SVG-PDA and SVG-D1   CHF (congestive heart failure) (HCC)    a. EF < 20% by echo in 11/2019   COPD (chronic obstructive pulmonary disease) ([place])    Essential hypertension    PAF (paroxysmal atrial fibrillation) (HCC)    Type 2 diabetes mellitus ([place])      Family History  Problem Relation Age of Onset   Arthritis Other    Lung disease Other    Asthma Other    Diabetes Other    Heart disease Sister        Tumor?     Social History   Socioeconomic History   Marital status: Married    Spouse name: Not on file   Number of children: Not on file   Years of education: 12   Highest education level: Not on file  Occupational History   Not on file  Tobacco Use   Smoking status: Former    Packs/day: 1.00    Years: 30.00    Pack years: 30.00    Types: Cigarettes    Quit date: 12/20/2019    Years since quitting: 1.8   Smokeless tobacco: Never  Vaping Use   Vaping Use: Never used  Substance and Sexual Activity   Alcohol use: Yes    Comment: Prior history of excessive intake   Drug use: No   Sexual activity: Not on file  Other Topics Concern   Not on file  Social History Narrative   Not on file   Social Determinants of Health   Financial Resource Strain: Low Risk     Difficulty of Paying Living Expenses: Not very hard  Food Insecurity: No Food Insecurity   Worried About Running Out of Food in the Last Year: Never true   Ran Out of Food in the Last Year: Never true  Transportation Needs: Unmet Transportation Needs   Lack of Transportation (Medical): Yes   Lack of Transportation (Non-Medical): No  Physical Activity: Inactive   Days of Exercise per Week: 0 days   Minutes of Exercise per Session: 0 min  Stress: Not on file  Social Connections: Not on file  Intimate Partner Violence: Not on file     No Known Allergies   Outpatient Medications Prior to Visit  Medication Sig Dispense Refill   albuterol (PROVENTIL) (2.5 MG/3ML) 0.083% nebulizer solution Take 3 mLs (2.5 mg total) by nebulization every 6 (six) hours as needed for wheezing or shortness of breath. 75 mL 6   amiodarone (PACERONE) 200 MG tablet Take 0.5 tablets (100 mg total) by mouth daily. Take 1 pill twice daily for 10 days and then resume a dose of 1 pill daily 30 tablet 3   blood glucose meter kit and supplies KIT Dispense based on patient and insurance preference. Use to check  CBG's three times a day. (FOR ICD-9 250.00, 250.01). 1 each 0   colchicine 0.6 MG tablet Take 1 tablet (0.6 mg total) by mouth as needed. (Patient not taking: No sig reported) 60 tablet 3   cyclobenzaprine (FLEXERIL) 5 MG tablet Take 1-2 tablets (5-10 mg total) by mouth 2 (two) times daily as needed for muscle spasms. (Patient not taking: No sig reported) 45 tablet 3   Fluticasone-Umeclidin-Vilant (TRELEGY ELLIPTA) 100-62.5-25 MCG/INH AEPB Inhale 1 puff into the lungs daily. 60 each 6   gabapentin (NEURONTIN) 600 MG tablet Take 600 mg by mouth 2 (two) times daily.     guaiFENesin (MUCINEX) 600 MG 12 hr tablet Take 1 tablet (600 mg total) by mouth 2 (two) times daily. 60 tablet 6   loratadine (CLARITIN) 10 MG tablet Take 1 tablet (10 mg total) by mouth daily. 30 tablet 6   metFORMIN (GLUCOPHAGE) 1000 MG tablet TAKE 1  TABLET (1,000 MG TOTAL) BY MOUTH IN THE MORNING AND AT BEDTIME. 180 tablet 3   Multiple Vitamin (MULTIVITAMIN WITH MINERALS) TABS tablet Take 1 tablet by mouth daily.     OLANZapine (ZYPREXA) 5 MG tablet Take 5 mg by mouth at bedtime.     pantoprazole (PROTONIX) 40 MG tablet Take 1 tablet (40 mg total) by mouth daily. 30 tablet 6   rosuvastatin (CRESTOR) 20 MG tablet Take 20 mg by mouth daily.     sacubitril-valsartan (ENTRESTO) 24-26 MG Take 1 tablet by mouth 2 (two) times daily. 60 tablet 6   sertraline (ZOLOFT) 50 MG tablet TAKE 1 TABLET BY MOUTH EVERY DAY (Patient taking differently: Take 50 mg by mouth daily.) 90 tablet 3   Tetrahydrozoline HCl (REDNESS RELIEVER EYE DROPS OP) Apply 2 drops to eye daily as needed.     traZODone (DESYREL) 150 MG tablet TAKE 1 TABLET BY MOUTH AT BEDTIME. (Patient taking differently: Take 150 mg by mouth at bedtime.) 90 tablet 3   warfarin (COUMADIN) 4 MG tablet On 10/5 take 4 mg then resume 6 mg (1.5 tabs) on Mon/Wed/Fri and 4 mg (1 tab) all other days or as directed by HF Clinic 60 tablet 11   No facility-administered medications prior to visit.    Review of Systems  Constitutional:  Negative for chills, fever, malaise/fatigue and weight loss.  HENT:  Negative for congestion, sinus pain and sore throat.   Eyes: Negative.   Respiratory:  Negative for cough, hemoptysis, sputum production, shortness of breath and wheezing.   Cardiovascular:  Negative for chest pain, palpitations, orthopnea, claudication and leg swelling.  Gastrointestinal:  Negative for abdominal pain, heartburn, nausea and vomiting.  Genitourinary: Negative.   Musculoskeletal:  Negative for joint pain and myalgias.  Skin:  Negative for rash.  Neurological:  Negative for weakness.  Endo/Heme/Allergies: Negative.   Psychiatric/Behavioral: Negative.       Objective:  There were no vitals filed for this visit.   Physical Exam Constitutional:      General: He is not in acute  distress. HENT:     Head: Normocephalic and atraumatic.  Eyes:     Extraocular Movements: Extraocular movements intact.     Conjunctiva/sclera: Conjunctivae normal.     Pupils: Pupils are equal, round, and reactive to light.  Cardiovascular:     Rate and Rhythm: Normal rate and regular rhythm.     Pulses: Normal pulses.     Heart sounds: Normal heart sounds. No murmur heard. Abdominal:     General: Bowel sounds are normal.  Palpations: Abdomen is soft.  Musculoskeletal:     Right lower leg: No edema.     Left lower leg: No edema.  Lymphadenopathy:     Cervical: No cervical adenopathy.  Skin:    General: Skin is warm and dry.  Neurological:     General: No focal deficit present.     Mental Status: He is alert.  Psychiatric:        Mood and Affect: Mood normal.        Behavior: Behavior normal.        Thought Content: Thought content normal.        Judgment: Judgment normal.      CBC    Component Value Date/Time   WBC 8.5 09/18/2021 1111   RBC 4.81 09/18/2021 1111   HGB 13.6 09/18/2021 1111   HCT 40.9 09/18/2021 1111   PLT 192 09/18/2021 1111   MCV 85.0 09/18/2021 1111   MCH 28.3 09/18/2021 1111   MCHC 33.3 09/18/2021 1111   RDW 13.5 09/18/2021 1111   LYMPHSABS 0.7 09/09/2021 0313   MONOABS 0.1 09/09/2021 0313   EOSABS 0.0 09/09/2021 0313   BASOSABS 0.0 09/09/2021 0313     Chest imaging: CXR 09/08/2021 Cardiac shadow is stable. Postsurgical changes are noted as well as an LVAD in place. Aortic calcifications are seen. Lungs are well aerated without focal infiltrate or effusion. No bony abnormality is noted. Old left clavicular fracture is seen.  CTA Chest 11/12/20 Mild mediastinal and hilar adenopathy.    Lungs/Pleura: There are small bilateral pleural effusions adjacent atelectasis. There are emphysematous changes bilaterally. There is no evidence for a pneumothorax.  PFT: PFT Results Latest Ref Rng & Units 11/10/2020  FVC-Pre L 2.92  FVC-Predicted  Pre % 60  Pre FEV1/FVC % % 61  FEV1-Pre L 1.79  FEV1-Predicted Pre % 49   Labs:  Path:  Echo 07/26/21: Limited study for LVAD RAMP. EF 25% RV appears dilated inlet cannula  not visualized Limted AV opening and no AR RPM set at 5600 with LVIDs 4.23  cm.   Heart Catheterization:   Assessment & Plan:   Chronic obstructive pulmonary disease, unspecified COPD type ([place])  Discussion: ***    Current Outpatient Medications:    albuterol (PROVENTIL) (2.5 MG/3ML) 0.083% nebulizer solution, Take 3 mLs (2.5 mg total) by nebulization every 6 (six) hours as needed for wheezing or shortness of breath., Disp: 75 mL, Rfl: 6   amiodarone (PACERONE) 200 MG tablet, Take 0.5 tablets (100 mg total) by mouth daily. Take 1 pill twice daily for 10 days and then resume a dose of 1 pill daily, Disp: 30 tablet, Rfl: 3   blood glucose meter kit and supplies KIT, Dispense based on patient and insurance preference. Use to check CBG's three times a day. (FOR ICD-9 250.00, 250.01)., Disp: 1 each, Rfl: 0   colchicine 0.6 MG tablet, Take 1 tablet (0.6 mg total) by mouth as needed. (Patient not taking: No sig reported), Disp: 60 tablet, Rfl: 3   cyclobenzaprine (FLEXERIL) 5 MG tablet, Take 1-2 tablets (5-10 mg total) by mouth 2 (two) times daily as needed for muscle spasms. (Patient not taking: No sig reported), Disp: 45 tablet, Rfl: 3   Fluticasone-Umeclidin-Vilant (TRELEGY ELLIPTA) 100-62.5-25 MCG/INH AEPB, Inhale 1 puff into the lungs daily., Disp: 60 each, Rfl: 6   gabapentin (NEURONTIN) 600 MG tablet, Take 600 mg by mouth 2 (two) times daily., Disp: , Rfl:    guaiFENesin (MUCINEX) 600 MG 12 hr tablet, Take  1 tablet (600 mg total) by mouth 2 (two) times daily., Disp: 60 tablet, Rfl: 6   loratadine (CLARITIN) 10 MG tablet, Take 1 tablet (10 mg total) by mouth daily., Disp: 30 tablet, Rfl: 6   metFORMIN (GLUCOPHAGE) 1000 MG tablet, TAKE 1 TABLET (1,000 MG TOTAL) BY MOUTH IN THE MORNING AND AT BEDTIME., Disp: 180  tablet, Rfl: 3   Multiple Vitamin (MULTIVITAMIN WITH MINERALS) TABS tablet, Take 1 tablet by mouth daily., Disp:  , Rfl:    OLANZapine (ZYPREXA) 5 MG tablet, Take 5 mg by mouth at bedtime., Disp: , Rfl:    pantoprazole (PROTONIX) 40 MG tablet, Take 1 tablet (40 mg total) by mouth daily., Disp: 30 tablet, Rfl: 6   rosuvastatin (CRESTOR) 20 MG tablet, Take 20 mg by mouth daily., Disp: , Rfl:    sacubitril-valsartan (ENTRESTO) 24-26 MG, Take 1 tablet by mouth 2 (two) times daily., Disp: 60 tablet, Rfl: 6   sertraline (ZOLOFT) 50 MG tablet, TAKE 1 TABLET BY MOUTH EVERY DAY (Patient taking differently: Take 50 mg by mouth daily.), Disp: 90 tablet, Rfl: 3   Tetrahydrozoline HCl (REDNESS RELIEVER EYE DROPS OP), Apply 2 drops to eye daily as needed., Disp: , Rfl:    traZODone (DESYREL) 150 MG tablet, TAKE 1 TABLET BY MOUTH AT BEDTIME. (Patient taking differently: Take 150 mg by mouth at bedtime.), Disp: 90 tablet, Rfl: 3   warfarin (COUMADIN) 4 MG tablet, On 10/5 take 4 mg then resume 6 mg (1.5 tabs) on Mon/Wed/Fri and 4 mg (1 tab) all other days or as directed by HF Clinic, Disp: 60 tablet, Rfl: 11

## 2021-10-17 ENCOUNTER — Other Ambulatory Visit (HOSPITAL_COMMUNITY): Payer: Self-pay

## 2021-10-22 ENCOUNTER — Ambulatory Visit (HOSPITAL_COMMUNITY): Payer: Self-pay | Admitting: Pharmacist

## 2021-10-22 ENCOUNTER — Other Ambulatory Visit (HOSPITAL_COMMUNITY): Payer: Self-pay | Admitting: *Deleted

## 2021-10-22 DIAGNOSIS — Z95811 Presence of heart assist device: Secondary | ICD-10-CM

## 2021-10-22 DIAGNOSIS — J449 Chronic obstructive pulmonary disease, unspecified: Secondary | ICD-10-CM

## 2021-10-22 DIAGNOSIS — I5022 Chronic systolic (congestive) heart failure: Secondary | ICD-10-CM

## 2021-10-22 DIAGNOSIS — Z7901 Long term (current) use of anticoagulants: Secondary | ICD-10-CM

## 2021-10-22 LAB — POCT INR: INR: 5.5 — AB (ref 2.0–3.0)

## 2021-10-22 NOTE — Progress Notes (Signed)
LVAD INR 

## 2021-10-23 ENCOUNTER — Encounter (HOSPITAL_COMMUNITY): Payer: 59

## 2021-10-24 ENCOUNTER — Encounter (HOSPITAL_COMMUNITY): Payer: Self-pay | Admitting: Unknown Physician Specialty

## 2021-10-30 ENCOUNTER — Telehealth (HOSPITAL_COMMUNITY): Payer: Self-pay | Admitting: Pharmacist

## 2021-10-30 ENCOUNTER — Other Ambulatory Visit (HOSPITAL_COMMUNITY): Payer: Self-pay | Admitting: Cardiology

## 2021-10-30 DIAGNOSIS — M25512 Pain in left shoulder: Secondary | ICD-10-CM

## 2021-10-30 NOTE — Telephone Encounter (Signed)
Patient was due to check INR on home machine yesterday. Unable to reach patient after multiple attempts. Unable to leave VM as VM is full. Unable to confirm patient received VM left last week with updated warfarin instructions. Have informed LVAD coordinators. Patient has missed multiple follow up appointments.   Karle Plumber, PharmD, BCPS, BCCP, CPP Heart Failure Clinic Pharmacist [phone]

## 2021-11-05 ENCOUNTER — Other Ambulatory Visit (HOSPITAL_COMMUNITY): Payer: Self-pay | Admitting: Cardiology

## 2021-11-05 ENCOUNTER — Telehealth (HOSPITAL_COMMUNITY): Payer: Self-pay | Admitting: *Deleted

## 2021-11-05 ENCOUNTER — Encounter (HOSPITAL_COMMUNITY): Payer: Self-pay | Admitting: *Deleted

## 2021-11-05 DIAGNOSIS — G4700 Insomnia, unspecified: Secondary | ICD-10-CM

## 2021-11-05 DIAGNOSIS — Z95811 Presence of heart assist device: Secondary | ICD-10-CM

## 2021-11-05 NOTE — Telephone Encounter (Signed)
Patient's mother called to re-schedule patients last missed appointment next week. F/U appointment made for Wed Nov 14, 2021 at 9:00 am. Mother will call back if this date/time does not work for patient.  Mother reports patient is "without a phone". Asked if we could contact her with INR results/warfarin dosing and/or any other patient related calls. She verbalized agreement to same. Will update Karle Plumber, PharmD who has been having difficulty reaching patient.  Hessie Diener RN, VAD Coorinator [phone]

## 2021-11-08 ENCOUNTER — Other Ambulatory Visit (HOSPITAL_COMMUNITY): Payer: Self-pay | Admitting: Cardiology

## 2021-11-08 DIAGNOSIS — Z95811 Presence of heart assist device: Secondary | ICD-10-CM

## 2021-11-13 ENCOUNTER — Telehealth (HOSPITAL_COMMUNITY): Payer: Self-pay | Admitting: *Deleted

## 2021-11-13 ENCOUNTER — Other Ambulatory Visit (HOSPITAL_COMMUNITY): Payer: Self-pay | Admitting: Unknown Physician Specialty

## 2021-11-13 DIAGNOSIS — Z95811 Presence of heart assist device: Secondary | ICD-10-CM

## 2021-11-13 DIAGNOSIS — Z7901 Long term (current) use of anticoagulants: Secondary | ICD-10-CM

## 2021-11-13 NOTE — Telephone Encounter (Signed)
VAD Coordinator called pt's mother to confirm appt in [facility] tomorrow. Mother reports pts' foot is "hurting so bad he can't walk on it"; "not sure if he will be able to make it to clinic visit".  Mother reports he and [name] (wife) have a new phone; contact information given [phone].  Attempted to call, no answer left message.  Wife called back and reports patient will not be able to make appt tomorrow. Plan is to go to [facility] ED for x-ray. Denies falls, trauma to foot. He has been treated for gout in past, but doesn't think that is what it is. They will call and re-schedule clinic appt.  Reminded [name] pt is also behind in checking INR. She says they "ran out of strips and it took a month to get new ones". Advised her we must check INRs as scheduled and asked that she call in the future so we can make other arrangements. She verbalized agreement to same.  Karle Plumber, PharmD updated re: above. Contact info updated in Epic.  Hessie Diener RN, VAD Coordinator [phone]

## 2021-11-14 ENCOUNTER — Encounter (HOSPITAL_COMMUNITY): Payer: 59

## 2021-11-21 ENCOUNTER — Ambulatory Visit (HOSPITAL_COMMUNITY): Payer: Self-pay | Admitting: Pharmacist

## 2021-11-21 LAB — POCT INR: INR: 1.5 — AB (ref 2.0–3.0)

## 2021-11-21 NOTE — Progress Notes (Signed)
LVAD INR 

## 2021-11-26 ENCOUNTER — Ambulatory Visit (HOSPITAL_COMMUNITY): Payer: Self-pay | Admitting: Pharmacist

## 2021-11-26 LAB — POCT INR: INR: 2.2 (ref 2.0–3.0)

## 2021-11-26 NOTE — Progress Notes (Signed)
LVAD INR 

## 2021-12-04 ENCOUNTER — Ambulatory Visit (HOSPITAL_COMMUNITY): Payer: Self-pay | Admitting: Pharmacist

## 2021-12-04 ENCOUNTER — Ambulatory Visit (HOSPITAL_COMMUNITY)
Admission: RE | Admit: 2021-12-04 | Discharge: 2021-12-04 | Disposition: A | Discharge status: Home / Self Care | Payer: 59 | Source: Ambulatory Visit | Attending: Cardiology | Admitting: Cardiology

## 2021-12-04 ENCOUNTER — Other Ambulatory Visit (HOSPITAL_COMMUNITY): Payer: Self-pay | Admitting: *Deleted

## 2021-12-04 ENCOUNTER — Other Ambulatory Visit: Payer: Self-pay

## 2021-12-04 VITALS — BP 107/72 | Temp 97.6°F | Wt 189.6 lb

## 2021-12-04 DIAGNOSIS — E119 Type 2 diabetes mellitus without complications: Secondary | ICD-10-CM | POA: Diagnosis not present

## 2021-12-04 DIAGNOSIS — I2510 Atherosclerotic heart disease of native coronary artery without angina pectoris: Secondary | ICD-10-CM | POA: Diagnosis not present

## 2021-12-04 DIAGNOSIS — Z95811 Presence of heart assist device: Secondary | ICD-10-CM

## 2021-12-04 DIAGNOSIS — M109 Gout, unspecified: Secondary | ICD-10-CM

## 2021-12-04 DIAGNOSIS — I110 Hypertensive heart disease with heart failure: Secondary | ICD-10-CM | POA: Insufficient documentation

## 2021-12-04 DIAGNOSIS — Z79899 Other long term (current) drug therapy: Secondary | ICD-10-CM | POA: Diagnosis not present

## 2021-12-04 DIAGNOSIS — E0590 Thyrotoxicosis, unspecified without thyrotoxic crisis or storm: Secondary | ICD-10-CM | POA: Diagnosis not present

## 2021-12-04 DIAGNOSIS — J441 Chronic obstructive pulmonary disease with (acute) exacerbation: Secondary | ICD-10-CM | POA: Insufficient documentation

## 2021-12-04 DIAGNOSIS — I5022 Chronic systolic (congestive) heart failure: Secondary | ICD-10-CM | POA: Insufficient documentation

## 2021-12-04 DIAGNOSIS — F32A Depression, unspecified: Secondary | ICD-10-CM | POA: Diagnosis not present

## 2021-12-04 DIAGNOSIS — Z951 Presence of aortocoronary bypass graft: Secondary | ICD-10-CM | POA: Diagnosis not present

## 2021-12-04 DIAGNOSIS — Z7901 Long term (current) use of anticoagulants: Secondary | ICD-10-CM | POA: Insufficient documentation

## 2021-12-04 DIAGNOSIS — I480 Paroxysmal atrial fibrillation: Secondary | ICD-10-CM | POA: Insufficient documentation

## 2021-12-04 LAB — COMPREHENSIVE METABOLIC PANEL
ALT: 30 U/L (ref 0–44)
AST: 22 U/L (ref 15–41)
Albumin: 3.7 g/dL (ref 3.5–5.0)
Alkaline Phosphatase: 83 U/L (ref 38–126)
Anion gap: 9 (ref 5–15)
BUN: 17 mg/dL (ref 6–20)
CO2: 25 mmol/L (ref 22–32)
Calcium: 9.7 mg/dL (ref 8.9–10.3)
Chloride: 103 mmol/L (ref 98–111)
Creatinine, Ser: 0.87 mg/dL (ref 0.61–1.24)
GFR, Estimated: >60 mL/min (ref >60)
Glucose, Bld: 126 mg/dL — ABNORMAL HIGH (ref 70–99)
Potassium: 4.1 mmol/L (ref 3.5–5.1)
Sodium: 137 mmol/L (ref 135–145)
Total Bilirubin: 0.4 mg/dL (ref 0.3–1.2)
Total Protein: 6.9 g/dL (ref 6.5–8.1)

## 2021-12-04 LAB — CBC
HCT: 39.6 % (ref 39.0–52.0)
Hemoglobin: 13.7 g/dL (ref 13.0–17.0)
MCH: 29 pg (ref 26.0–34.0)
MCHC: 34.6 g/dL (ref 30.0–36.0)
MCV: 83.9 fL (ref 80.0–100.0)
Platelets: 275 10*3/uL (ref 150–400)
RBC: 4.72 MIL/uL (ref 4.22–5.81)
RDW: 14.8 % (ref 11.5–15.5)
WBC: 8.3 10*3/uL (ref 4.0–10.5)
nRBC: 0 % (ref 0.0–0.2)

## 2021-12-04 LAB — LACTATE DEHYDROGENASE: LDH: 137 U/L (ref 98–192)

## 2021-12-04 LAB — T4, FREE: Free T4: 3.08 ng/dL — ABNORMAL HIGH (ref 0.61–1.12)

## 2021-12-04 LAB — PROTIME-INR
INR: 4.6 (ref 0.8–1.2)
Prothrombin Time: 43.8 seconds — ABNORMAL HIGH (ref 11.4–15.2)

## 2021-12-04 LAB — PREALBUMIN: Prealbumin: 28.2 mg/dL (ref 18–38)

## 2021-12-04 LAB — TSH: TSH: <0.01 u[IU]/mL — ABNORMAL LOW (ref 0.350–4.500)

## 2021-12-04 MED ORDER — ALLOPURINOL 100 MG PO TABS: 100.0000 mg | ORAL_TABLET | Freq: Every day | ORAL | 3 refills | Status: DC

## 2021-12-04 MED ORDER — SERTRALINE HCL 50 MG PO TABS: 50.0000 mg | ORAL_TABLET | Freq: Every day | ORAL | 3 refills | Status: DC

## 2021-12-04 NOTE — Patient Instructions (Addendum)
Start Allopurinol 100  mg daily for gout prevention  Coumadin doing per Lauren PharmD Increase your fluid intake and your daily exercise Return to clinic in 1 month for follow up appt with Dr Shirlee Latch. We will complete an echo at this appt  Please bring battery charger, mobile power unit (MPU), all batteries, and black back up bag for annual maintenance.

## 2021-12-04 NOTE — Progress Notes (Addendum)
Patient presents for 1 month f/u with 1 year Intermacs in [facility] today with his wife [name]. Reports no problems with VAD equipment or concerns with drive line.  Pt did not bring equipment for annual maintenance today. He did not bring back up bag. Discussed need to bring all equipment to his next appt for annual maintenance. He verbalized understanding.   Patient reports he feels like he has no energy over the last month or so, and is mostly sedentary.Reports occasional dizziness/lightheadedness when going from a laying down to standing position. This resolves quickly with rest. Reports 2 falls at the beginning of December after standing up too quickly. Denies hitting his head. Denies heart failure symptoms, shortness of breath, or signs of bleeding. Pt reports he does not think he would be successful with attending cardiac rehab. Dr Aundra Dubin encouraged him to increase physical activity daily. He verbalized understanding.   100+ PI events noted daily on interrogation. Encouraged to increase PO intake. Will plan for RAMP echo at next visit per Dr Aundra Dubin.   Pt's weight is down 12.2 lb today. Reports he was recently started on Ozempic 0.$RemoveBefo'5mg'mMQuzlOvmza$  SQ weekly by PCP for diabetes management. Encouraged to keep up the good work!  EKG completed and reviewed by Dr Aundra Dubin. He is SR with occasional PACs. Currently taking Amiodarone 100 mg daily. Will continue Amiodarone per Dr Aundra Dubin.  Pt no showed scheduled PFTs and 2 pulmonology appts. He reports "it is too much to keep up with." Denies shortness of breath. Reports cough has resolved. He is using his Trelegy inhaler and PRN Albuterol as prescribed.   Reports vision difficulty while attempting trail-making activity. He did not bring his glasses so he used [employer] glasses, but was unable to complete trail-making. Encouraged to make eye doctor appt especially since he is taking Amiodarone daily. He verbalized understanding.   Reports he has been out of Zoloft and  Colchicine x 2 months, but did not notify VAD coordinators. Refill sent in for both medications. Pt reports gout flair at beginning of the month that "passed on it's own." Per Dr Aundra Dubin will start Allopurinol 100 mg daily for gout prevention. Pt to use Colchicine PRN for gout flairs. Updated prescriptions sent to pt's pharmacy.   Tracey's new phone number: [phone]  Vital Signs:  Doppler Pressure: 70 Automatc BP: 107/72 (85) HR: 106 SR w/PACs SPO2: UTO    Weight: 189.6 lb w/ eqt Last weight: 201.8 lb   Home weights: 190-192 lbs  VAD Indication: Destination Therapy. HM III implanted 11/17/20 by Dr Orvan Seen   VAD interrogation: Speed: 5600 Flow: 3.8 Power: 4.3 w    PI: 8.6 Hct: 41  Alarms: none  Events: 100 + daily  Fixed speed: 5500 Low speed limit: 5200  Primary controller back up battery due for replacement in 22 months. Back up controller back up battery due for change in 20 months. - not present today   I reviewed the LVAD parameters from today and compared the results to the patient's prior recorded data. LVAD interrogation was NEGATIVE for significant power changes, NEGATIVE for clinical alarms and STABLE for PI events/speed drops. No programming changes were made and pump is functioning within specified parameters. Pt is performing daily controller and system monitor self tests along with completing weekly and monthly maintenance for LVAD equipment.   LVAD equipment check completed and is in good working order. Back-up equipment present. Charged back up battery and performed self-test on equipment.    Annual Equipment Maintenance on UBC/PM  was performed on 11/30/2020.   Exit Site Care: Drive line is being maintained weekly by patient's wife [name]. Dressing is clean, dry, and intact. Anchor correctly applied. Provided with 8 weekly kits for home use.   BP & Labs:  Doppler 70 - Doppler is reflecting MAP   Hgb 13.7 - No S/S of bleeding. Specifically denies  melena/BRBPR or nosebleeds. Occasional blood when wiping.    LDH 137 -  with established baseline of 190 - 360. Denies tea-colored urine. No power elevations noted on interrogation.    1 year Intermacs follow up completed including:  Quality of Life, KCCQ-12, and Neurocognitive trail making.   Pt did not feel up to completing 6 minute walk today reporting he is tired.   Back up controller: Backup equipment not present today  Patient Goals: Reports he would like to feel better with more energy. Plan for RAMP echo at next visit to optimize VAD speed. Offered to set up with cardiac rehab, but pt refused stating he did not think he could commit to attending.   [facility] Cardiomyopathy Questionnaire  KCCQ-12 12/04/2021 05/03/2021 02/26/2021  1 a. Ability to shower/bathe Not at all limited Not at all limited Slightly limited  1 b. Ability to walk 1 block Not at all limited Not at all limited Not at all limited  1 c. Ability to hurry/jog Other, Did not do Extremely limited Slightly limited  2. Edema feet/ankles/legs Never over the past 2 weeks Less than once a week Never over the past 2 weeks  3. Limited by fatigue 1-2 times a week All of the time Never over the past 2 weeks  4. Limited by dyspnea 1-2 times a week Several times a day Less than once a week  5. Sitting up / on 3+ pillows Less than once a week 3+ times a week, not every day Never over the past 2 weeks  6. Limited enjoyment of life Limited quite a bit Limited quite a bit Not limited at all  7. Rest of life w/ symptoms Somewhat satisfied Not at all satisfied Completely satisfied  8 a. Participation in hobbies Moderately limited Limited quite a bit Did not limit at all  8 b. Participation in chores Did not limit at all Moderately limited N/A, did not do for other reasons  8 c. Visiting family/friends Slightly limited Slightly limited N/A, did not do for other reasons      Plan: Start Allopurinol 100  mg daily for gout prevention   Coumadin doing per Lauren PharmD Increase your fluid intake and your daily exercise Return to clinic in 1 month for follow up appt with Dr Aundra Dubin. We will complete an echo at this appt  Please bring battery charger, mobile power unit (MPU), all batteries, and black back up bag for annual maintenance.   Emerson Monte RN [place] Coordinator  Office: [phone]  24/7 Pager: [phone]   Follow up for Heart Failure/LVAD:  60 y.o. 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 type 2 diabetes mellitus, COPD, and hypertension. Now s/p Heartmate 3 LVAD.   Patient had echo in 10/21 with EF 20-25%.  He was admitted in 12/21 with cardiogenic shock.  Cath showed patent grafts and low cardiac output.  Suspect mixed ischemic/nonischemic cardiomyopathy, prior heavy ETOH though no ETOH or smoking since 12/20 CABG.  He required stabilization with Impella  5.5 and ultimately had placement of Heartmate 3 LVAD.  Paroxysmal atrial fibrillation noted post-op, but generally stable course and discharged home.    Patient was admitted in 10/22 with COPD exacerbation.  Wilder Glade was stopped with frequent PIs.  He was treated with steroids, nebs, antibiotics with improvement.   Patient returns for LVAD followup.  Weight is down 12 lbs, he recently started Ozempic.  He is using Trelegy which has helped his breathing.  He had recent gout in his left foot, this has resolved without treatment (out of colchicine).   Breathing is stable, no dyspnea walking on flat ground.  He fatigues easily.  He has many daily PI events.  MAP is stable at 85 today. He is in NSR.   Denies LVAD alarms.  Reports taking Coumadin as prescribed and adherence to anticoagulation based dietary restrictions.  Denies bright red blood per rectum or melena, no dark urine or hematuria.    ECG  (personally reviewed): NSR, low voltage  Labs (12/21): K 4.1, creatinine 0.73, INR 2.4, hgb 12.3, LDH 194, LFTs normal, TSH normal Labs (1/22): hgb 14.4, creatinine 0.91, LDH 197 Labs (5/22): K 3.8, creatinine 1.44, hgb 15, LDH 177 Labs (10/22): K 4, creatinine 1.01 => 0.76, hgb 13.8, LDL 61, LDH 152 Labs (12/22): free T4 elevated with low TSH.    Past Medical History:  Diagnosis Date   Arthritis    CAD (coronary artery disease)    a. s/p CABG in 11/2019 with LIMA-LAD, SVG-OM1, SVG-PDA and SVG-D1   CHF (congestive heart failure) (HCC)    a. EF < 20% by echo in 11/2019   COPD (chronic obstructive pulmonary disease) (HCC)    Essential hypertension    PAF (paroxysmal atrial fibrillation) (HCC)    Type 2 diabetes mellitus (HCC)     Current Outpatient Medications  Medication Sig Dispense Refill   albuterol (PROVENTIL) (2.5 MG/3ML) 0.083% nebulizer solution Take 3 mLs (2.5 mg total) by nebulization every 6 (six) hours as needed for wheezing or shortness of breath. 75 mL 6   allopurinol (ZYLOPRIM) 100 MG tablet Take 1 tablet (100 mg total) by mouth daily. 90 tablet 3   amiodarone (PACERONE) 200 MG tablet Take 0.5 tablets (100 mg total) by mouth daily. Take 1 pill twice daily for 10 days and then resume a dose of 1 pill daily 30 tablet 3   colchicine 0.6 MG tablet TAKE 1 TABLET (0.6 MG TOTAL) BY MOUTH AS NEEDED. 180 tablet 1   Fluticasone-Umeclidin-Vilant (TRELEGY ELLIPTA) 100-62.5-25 MCG/INH AEPB Inhale 1 puff into the lungs daily. 60 each 6   gabapentin (NEURONTIN) 600 MG tablet Take 600 mg by mouth 2 (two) times daily.     guaiFENesin (MUCINEX) 600 MG 12 hr tablet Take 1 tablet (600 mg total) by mouth 2 (two) times daily. 60 tablet 6   loratadine (CLARITIN) 10 MG tablet Take 1 tablet (10 mg total) by mouth daily. 30 tablet 6   metFORMIN (GLUCOPHAGE) 1000 MG tablet TAKE 1 TABLET (1,000 MG TOTAL) BY MOUTH IN THE MORNING AND AT BEDTIME. 180 tablet 3   Multiple Vitamin (MULTIVITAMIN WITH  MINERALS) TABS tablet Take 1 tablet by mouth daily.     OLANZapine (ZYPREXA) 5 MG tablet Take 5 mg by mouth at bedtime.     pantoprazole (PROTONIX) 40 MG tablet TAKE 1 TABLET BY MOUTH EVERY DAY 90 tablet 2   rosuvastatin (CRESTOR) 20 MG tablet Take 20 mg by mouth daily.     sacubitril-valsartan (ENTRESTO) 24-26 MG Take  1 tablet by mouth 2 (two) times daily. 60 tablet 6   Semaglutide (OZEMPIC, 0.25 OR 0.5 MG/DOSE, [place]) Inject 0.5 mg into the skin once a week.     Tetrahydrozoline HCl (REDNESS RELIEVER EYE DROPS OP) Apply 2 drops to eye daily as needed.     traZODone (DESYREL) 150 MG tablet Take 1 tablet (150 mg total) by mouth at bedtime. 90 tablet 3   warfarin (COUMADIN) 4 MG tablet On 10/5 take 4 mg then resume 6 mg (1.5 tabs) on Mon/Wed/Fri and 4 mg (1 tab) all other days or as directed by HF Clinic 60 tablet 11   blood glucose meter kit and supplies KIT Dispense based on patient and insurance preference. Use to check CBG's three times a day. (FOR ICD-9 250.00, 250.01). (Patient not taking: Reported on 12/04/2021) 1 each 0   cyclobenzaprine (FLEXERIL) 5 MG tablet Take 1-2 tablets (5-10 mg total) by mouth 2 (two) times daily as needed for muscle spasms. (Patient not taking: No sig reported) 45 tablet 3   furosemide (LASIX) 40 MG tablet TAKE 0.5 TABLETS (20 MG TOTAL) BY MOUTH DAILY AS NEEDED. (Patient not taking: Reported on 12/04/2021) 45 tablet 3   sertraline (ZOLOFT) 50 MG tablet Take 1 tablet (50 mg total) by mouth daily. 90 tablet 3   No current facility-administered medications for this encounter.    Patient has no known allergies.  REVIEW OF SYSTEMS: All systems negative except as listed in HPI, PMH and Problem list.   LVAD INTERROGATION:  See nurse's note above.   I reviewed the LVAD parameters from today, and compared the results to the patient's prior recorded data.  No programming changes were made.  The LVAD is functioning within specified parameters.  The patient performs LVAD  self-test daily.  LVAD interrogation was negative for any significant power changes, alarms or PI events/speed drops.  LVAD equipment check completed and is in good working order.  Back-up equipment present.   LVAD education done on emergency procedures and precautions and reviewed exit site care.    Vitals:   12/04/21 1125 12/04/21 1126  BP: (!) 70/0 107/72  Temp: 97.6 F (36.4 C)   Weight: 86 kg (189 lb 9.6 oz)   MAP 82  Physical Exam: General: Well appearing this am. NAD.  HEENT: Normal. Neck: Supple, JVP 7-8 cm. Carotids OK.  Cardiac:  Mechanical heart sounds with LVAD hum present.  Lungs:  CTAB, normal effort.  Abdomen:  NT, ND, no HSM. No bruits or masses. +BS  LVAD exit site: Well-healed and incorporated. Dressing dry and intact. No erythema or drainage. Stabilization device present and accurately applied. Driveline dressing changed daily per sterile technique. Extremities:  Warm and dry. No cyanosis, clubbing, rash, or edema.  Neuro:  Alert & oriented x 3. Cranial nerves grossly intact. Moves all 4 extremities w/o difficulty. Affect pleasant    ASSESSMENT AND PLAN: 1. Chronic systolic CHF: Echo 79/02 with EF 20-25%, mildly decreased RV function. LHC/RHC in 12/21 with patent grafts, low output. Suspect mixed ischemic/nonischemic cardiomyopathy (prior heavy ETOH and drugs as well as CAD).  No ETOH, drugs, smoking since CABG in 12/20. Admitted with cardiogenic shock in 12/21, had placement of Impella 5.5 initially, now s/p Heartmate 3 LVAD on 11/17/20.  NYHA class II-III.  Not volume overloaded on exam with multiple PI events on device interrogation. MAP stable.  - Continue Entresto 24/26 bid.   - Encourage hydration with many PI events.  - Will plan ramp echo at  next appointment in 1 month.  - Continue Coumadin, INR goal 2-2.5.  - I suspect that hyperthyroidism has a role in his fatigue (see below).  2. CAD: S/p CABG 12/20.  LHC pre-VAD with patent grafts, no target for  intervention. - Continue statin, good lipids in 10/22. 3. Atrial fibrillation: Paroxysmal.  S/p Maze and LA appendage clip with CABG in 12/20.  In NSR today.  - He has been on amiodarone, but with hyperthyroidism will stop (see below).  4. Type 2 diabetes: Close followup with PCP.   5. Prior ETOH, drugs: None x  >1 year.  6. Gout: Recent flare, resolved.  - Start allopurinol 100 mg daily.  - Will refill colchicine prn prescription.  7. PACs/PVCs: Appear to be resolved now that he is back on amiodarone.  - Will need to stop amiodarone with hyperthyroidism, see below.   8. COPD: Recent COPD exacerbation, overall feels better on Trelegy.  - Followup with pulmonary.  9. Hyperthyroidism: Noted on today's labs.  This may explain weight loss and fatigue.  Likely related to amiodarone.  - Stop amiodarone.  - Start methimazole 10 mg daily.  - Will arrange appointment with [facility] asap.    Followup in 1 month.   Loralie Champagne 12/04/2021

## 2021-12-04 NOTE — Progress Notes (Signed)
LVAD INR 

## 2021-12-05 ENCOUNTER — Encounter (HOSPITAL_COMMUNITY): Payer: Self-pay | Admitting: Family Medicine

## 2021-12-05 ENCOUNTER — Other Ambulatory Visit (HOSPITAL_COMMUNITY): Payer: Self-pay | Admitting: *Deleted

## 2021-12-05 DIAGNOSIS — E0590 Thyrotoxicosis, unspecified without thyrotoxic crisis or storm: Secondary | ICD-10-CM

## 2021-12-05 DIAGNOSIS — Z95811 Presence of heart assist device: Secondary | ICD-10-CM

## 2021-12-05 MED ORDER — METHIMAZOLE 10 MG PO TABS: 10.0000 mg | ORAL_TABLET | Freq: Every day | ORAL | 6 refills | Status: DC

## 2021-12-05 NOTE — Progress Notes (Signed)
Per lab work drawn in clinic yesterday will stop Amiodarone, and start Methimazole 10 mg daily per Dr Shirlee Latch. Urgent referral sent to [facility].   I called and spoke with patient's wife Kennith Center about the above changes. I made sure she wrote down the above instructions. New prescription sent to patient's pharmacy. Instructed her to call VAD coordinators with any difficulty obtaining medication, or if they do not hear from endo office by next week. She verbalized understanding of all the above.   Alyce Pagan RN VAD Coordinator  Office: [phone]  24/7 Pager: [phone]

## 2021-12-13 ENCOUNTER — Other Ambulatory Visit (HOSPITAL_COMMUNITY): Payer: Self-pay | Admitting: Cardiology

## 2021-12-13 DIAGNOSIS — Z95811 Presence of heart assist device: Secondary | ICD-10-CM

## 2021-12-20 ENCOUNTER — Telehealth (HOSPITAL_COMMUNITY): Payer: Self-pay | Admitting: Pharmacist

## 2021-12-20 NOTE — Telephone Encounter (Signed)
Patient is past due for INR check on home machine. Called patient and left VM for him to check his INR. Have called multiple times with no answer.   Karle Plumber, PharmD, BCPS, BCCP, CPP Heart Failure Clinic Pharmacist [phone]

## 2021-12-31 LAB — POCT INR: INR: 2.6 (ref 2.0–3.0)

## 2022-01-02 ENCOUNTER — Ambulatory Visit (HOSPITAL_COMMUNITY): Payer: Self-pay | Admitting: Pharmacist

## 2022-01-02 NOTE — Progress Notes (Signed)
LVAD INR 

## 2022-01-08 ENCOUNTER — Ambulatory Visit (HOSPITAL_COMMUNITY): Payer: Self-pay | Admitting: Pharmacist

## 2022-01-08 ENCOUNTER — Other Ambulatory Visit (HOSPITAL_COMMUNITY): Payer: Self-pay | Admitting: Unknown Physician Specialty

## 2022-01-08 DIAGNOSIS — Z7901 Long term (current) use of anticoagulants: Secondary | ICD-10-CM

## 2022-01-08 DIAGNOSIS — Z95811 Presence of heart assist device: Secondary | ICD-10-CM

## 2022-01-08 LAB — POCT INR: INR: 1.9 — AB (ref 2.0–3.0)

## 2022-01-08 NOTE — Progress Notes (Signed)
LVAD INR 

## 2022-01-09 ENCOUNTER — Other Ambulatory Visit: Payer: Self-pay

## 2022-01-09 ENCOUNTER — Ambulatory Visit (HOSPITAL_BASED_OUTPATIENT_CLINIC_OR_DEPARTMENT_OTHER)
Admission: RE | Admit: 2022-01-09 | Discharge: 2022-01-09 | Disposition: A | Discharge status: Home / Self Care | Payer: 59 | Source: Ambulatory Visit | Attending: Cardiology | Admitting: Cardiology

## 2022-01-09 ENCOUNTER — Other Ambulatory Visit (HOSPITAL_COMMUNITY): Payer: Self-pay | Admitting: *Deleted

## 2022-01-09 ENCOUNTER — Ambulatory Visit (HOSPITAL_COMMUNITY)
Admission: RE | Admit: 2022-01-09 | Discharge: 2022-01-09 | Disposition: A | Discharge status: Home / Self Care | Payer: 59 | Source: Ambulatory Visit | Attending: Cardiology | Admitting: Cardiology

## 2022-01-09 VITALS — BP 110/63 | HR 107 | Wt 179.4 lb

## 2022-01-09 DIAGNOSIS — J441 Chronic obstructive pulmonary disease with (acute) exacerbation: Secondary | ICD-10-CM | POA: Diagnosis not present

## 2022-01-09 DIAGNOSIS — I2510 Atherosclerotic heart disease of native coronary artery without angina pectoris: Secondary | ICD-10-CM | POA: Diagnosis not present

## 2022-01-09 DIAGNOSIS — R42 Dizziness and giddiness: Secondary | ICD-10-CM | POA: Insufficient documentation

## 2022-01-09 DIAGNOSIS — Z951 Presence of aortocoronary bypass graft: Secondary | ICD-10-CM | POA: Insufficient documentation

## 2022-01-09 DIAGNOSIS — R519 Headache, unspecified: Secondary | ICD-10-CM | POA: Diagnosis not present

## 2022-01-09 DIAGNOSIS — Z7901 Long term (current) use of anticoagulants: Secondary | ICD-10-CM | POA: Diagnosis not present

## 2022-01-09 DIAGNOSIS — E0590 Thyrotoxicosis, unspecified without thyrotoxic crisis or storm: Secondary | ICD-10-CM

## 2022-01-09 DIAGNOSIS — I10 Essential (primary) hypertension: Secondary | ICD-10-CM | POA: Diagnosis not present

## 2022-01-09 DIAGNOSIS — I493 Ventricular premature depolarization: Secondary | ICD-10-CM | POA: Diagnosis not present

## 2022-01-09 DIAGNOSIS — G4730 Sleep apnea, unspecified: Secondary | ICD-10-CM | POA: Diagnosis not present

## 2022-01-09 DIAGNOSIS — R009 Unspecified abnormalities of heart beat: Secondary | ICD-10-CM | POA: Insufficient documentation

## 2022-01-09 DIAGNOSIS — Z95811 Presence of heart assist device: Secondary | ICD-10-CM

## 2022-01-09 DIAGNOSIS — I110 Hypertensive heart disease with heart failure: Secondary | ICD-10-CM | POA: Diagnosis not present

## 2022-01-09 DIAGNOSIS — I5022 Chronic systolic (congestive) heart failure: Secondary | ICD-10-CM | POA: Insufficient documentation

## 2022-01-09 DIAGNOSIS — I480 Paroxysmal atrial fibrillation: Secondary | ICD-10-CM | POA: Insufficient documentation

## 2022-01-09 DIAGNOSIS — R39198 Other difficulties with micturition: Secondary | ICD-10-CM | POA: Diagnosis not present

## 2022-01-09 DIAGNOSIS — E119 Type 2 diabetes mellitus without complications: Secondary | ICD-10-CM | POA: Insufficient documentation

## 2022-01-09 DIAGNOSIS — I479 Paroxysmal tachycardia, unspecified: Secondary | ICD-10-CM | POA: Diagnosis not present

## 2022-01-09 DIAGNOSIS — Z79899 Other long term (current) drug therapy: Secondary | ICD-10-CM | POA: Insufficient documentation

## 2022-01-09 DIAGNOSIS — E785 Hyperlipidemia, unspecified: Secondary | ICD-10-CM | POA: Diagnosis not present

## 2022-01-09 LAB — T4, FREE: Free T4: 2.8 ng/dL — ABNORMAL HIGH (ref 0.61–1.12)

## 2022-01-09 LAB — BASIC METABOLIC PANEL
Anion gap: 16 — ABNORMAL HIGH (ref 5–15)
BUN: 21 mg/dL (ref 8–23)
CO2: 23 mmol/L (ref 22–32)
Calcium: 9.3 mg/dL (ref 8.9–10.3)
Chloride: 101 mmol/L (ref 98–111)
Creatinine, Ser: 1.07 mg/dL (ref 0.61–1.24)
GFR, Estimated: >60 mL/min (ref >60)
Glucose, Bld: 130 mg/dL — ABNORMAL HIGH (ref 70–99)
Potassium: 3.3 mmol/L — ABNORMAL LOW (ref 3.5–5.1)
Sodium: 140 mmol/L (ref 135–145)

## 2022-01-09 LAB — CBC
HCT: 37.3 % — ABNORMAL LOW (ref 39.0–52.0)
Hemoglobin: 13.4 g/dL (ref 13.0–17.0)
MCH: 29.8 pg (ref 26.0–34.0)
MCHC: 35.9 g/dL (ref 30.0–36.0)
MCV: 83.1 fL (ref 80.0–100.0)
Platelets: 225 10*3/uL (ref 150–400)
RBC: 4.49 MIL/uL (ref 4.22–5.81)
RDW: 13.4 % (ref 11.5–15.5)
WBC: 8.5 10*3/uL (ref 4.0–10.5)
nRBC: 0 % (ref 0.0–0.2)

## 2022-01-09 LAB — LACTATE DEHYDROGENASE: LDH: 136 U/L (ref 98–192)

## 2022-01-09 LAB — TSH: TSH: <0.01 u[IU]/mL — ABNORMAL LOW (ref 0.350–4.500)

## 2022-01-09 LAB — PROTIME-INR
INR: 2.1 — ABNORMAL HIGH (ref 0.8–1.2)
Prothrombin Time: 23.3 seconds — ABNORMAL HIGH (ref 11.4–15.2)

## 2022-01-09 MED ORDER — TAMSULOSIN HCL 0.4 MG PO CAPS: 0.4000 mg | ORAL_CAPSULE | Freq: Every day | ORAL | 6 refills | Status: DC

## 2022-01-09 MED ORDER — METOPROLOL SUCCINATE ER 25 MG PO TB24: 25.0000 mg | ORAL_TABLET | Freq: Every day | ORAL | 6 refills | Status: DC

## 2022-01-09 MED ORDER — METHIMAZOLE 10 MG PO TABS: 10.0000 mg | ORAL_TABLET | Freq: Two times a day (BID) | ORAL | 6 refills | Status: DC

## 2022-01-09 MED ORDER — LOSARTAN POTASSIUM 25 MG PO TABS: 12.5000 mg | ORAL_TABLET | Freq: Every day | ORAL | 6 refills | Status: DC

## 2022-01-09 NOTE — Progress Notes (Signed)
Speed  Flow  PI  Power  LVIDD  AI  Aortic opening MR  TR  Septum  RV  VTI (>18cm)  5600 4.4 3.3 4.3 3.8 none 3/5 none  none Slight shift left     5500  4.4 3.0 4.2 4.2 trivial 3/5 none trivial midline Moderately enlarged. Moves well                                                            Doppler MAP: 78 Auto cuff BP: 110/63 (84)   Ramp ECHO performed at bedside per Dr Shirlee Latch.   At completion of ramp study, patients primary controller programmed:  Fixed speed: 5500 Low speed limit: 5200   Alyce Pagan RN VAD Coordinator  Office: [phone]  24/7 Pager: [phone]

## 2022-01-09 NOTE — Progress Notes (Addendum)
Patient presents for 1 month f/u with annual maintenance today with his wife [name]. Reports no problems with VAD equipment or concerns with drive line.  Pt brought all his equipment, and his black bag, for annual maintenance today.   Patient reports he still feels like he has no energy, and is mostly sedentary. Reports occasional dizziness/lightheadedness when coughing. This resolves quickly. Denies falls, heart failure symptoms, shortness of breath, and signs of bleeding. Reports intermittent "really bad" headaches that start in his neck, up into the back of his head. This resolves with use of an ice pack. Per Dr Aundra Dubin these may be hyperthyroidism related.    Per Dr Aundra Dubin low energy level may be related to hyperthyroidism. Pt is scheduled to see Endocrinology on 01/22/22. Currently taking Methimazole 10 mg daily as instructed. Per Dr Aundra Dubin will increase to Methimazole 10 mg BID. TSH/T4/T3 obtained today. Updated prescription sent to pt's pharmacy.   100+ PI events noted daily on interrogation. Reports he is drinking at least 2 L per day. RAMP echo completed today. Decreased speed to 5500. Low speed limit 5200.   BP stable today. With increased # of PI events will stop Entresto today per Dr Aundra Dubin. Will start Losartan 12.5 mg daily. HR 107 - 123. Appears to be SR with frequent PACs and PVCs. EKG completed and reviewed with Dr Aundra Dubin. Amiodarone stopped 12/04/21 due to hyperthyroidism. Per Dr Aundra Dubin will start Toprol XL 25 mg daily. Prescriptions sent to pt's pharmacy for Losartan and Toprol. Instructed pt and his wife to notify VAD coordinators if he does not tolerate medication or VAD speed changes. They verbalized understanding.  Pt reports he is having difficulty starting urinary stream. He has not been seen by a urologist. Per Dr Aundra Dubin will start Flomax 0.4 mg daily. Called pt's wife and made aware of the above; she verbalized understanding. Prescription sent to pt's pharmacy.   Pt's weight is  down another 10 lb today. Continues with Ozempic 0.88m SQ weekly by PCP for diabetes management.   Vital Signs:  Doppler Pressure: 78 Automatc BP: 110/63 (84) HR: 107 - 123 SR w/ PACs & PVCs SPO2: 96    Weight: 179.4 lb w/ eqt Last weight: 289.6 lb   Home weights: 190-192 lbs  VAD Indication: Destination Therapy. HM III implanted 11/17/20 by Dr AOrvan Seen  VAD interrogation: Speed: 55597    decreased to 5500 per RAMP echo Flow: 4.3 Power: 4.3 w    PI: 3.5 Hct: 41  Alarms: none  Events: 100 + daily  Fixed speed: 5500 Low speed limit: 5200  Primary controller: replace back up battery in 61 months Back up controller: replace back up battery in 61 months   I reviewed the LVAD parameters from today and compared the results to the patient's prior recorded data. LVAD interrogation was NEGATIVE for significant power changes, NEGATIVE for clinical alarms and STABLE for PI events/speed drops. No programming changes were made and pump is functioning within specified parameters. Pt is performing daily controller and system monitor self tests along with completing weekly and monthly maintenance for LVAD equipment.   LVAD equipment check completed and is in good working order. Back-up equipment present. Charged back up battery and performed self-test on equipment.    Annual Equipment Maintenance on UBC/PM was performed on 01/09/2022.   Exit Site Care: Drive line is being maintained weekly by patient's wife TOlivia Mackie Dressing is clean, dry, and intact. Anchor correctly applied. Provided with 8 weekly kits for home use.  BP & Labs:  Doppler 78 - Doppler is reflecting MAP   Hgb 13.4 - No S/S of bleeding. Specifically denies melena/BRBPR or nosebleeds. Occasional blood when wiping.    LDH 136 -  with established baseline of 190 - 360. Denies tea-colored urine. No power elevations noted on interrogation.     Batteries Manufacture Date: Number of uses: Re-calibration  09/18/20 73-74 Performed  by patient   Annual maintenance completed per [employer] on patients home power module and Electrical engineer.    Backup system controller 11 volt battery charged during visit.   Plan: Increase Methimazole to 10 mg twice daily (for thyroid) Stop Entresto Start Losartan 12.5 mg daily (for blood pressure) Start Toprol XL 25 mg daily (for heart rate) Start Flomax  Coumadin dosing per Lauren PharmD Return to [facility] in 1 month for follow up with Dr Aundra Dubin We decreased your VAD speed to 5500 today If you experience increased dizziness, lightheadedness, or don't feel well with medication changes please call VAD coordinators   Emerson Monte RN [place] Coordinator  Office: [phone]  24/7 Pager: [phone]   Follow up for Heart Failure/LVAD:  61 y.o. 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 type 2 diabetes mellitus, COPD, and hypertension. Now s/p Heartmate 3 LVAD.   Patient had echo in 10/21 with EF 20-25%.  He was admitted in 12/21 with cardiogenic shock.  Cath showed patent grafts and low cardiac output.  Suspect mixed ischemic/nonischemic cardiomyopathy, prior heavy ETOH though no ETOH or smoking since 12/20 CABG.  He required stabilization with Impella 5.5 and ultimately had placement of Heartmate 3 LVAD.  Paroxysmal atrial fibrillation noted post-op, but generally stable course and discharged home.    Patient was admitted in 10/22 with COPD exacerbation.  [place] Glade was stopped with frequent PIs.  He was treated with steroids, nebs, antibiotics with improvement.   Patient returns for LVAD followup.  He is in sinus tachycardia today, rate 110s. He has occasional lightheadedness though MAP is stable today.  Weight is down 10 lbs.  Frequent PI events.  He is trying to stay well-hydrated.  No dyspnea walking on flat  ground but does get fatigued.  He has headaches on and off.  Still has had had endocrinology appt, he is taking methimazole for hyperthyroidism and has stopped amiodarone.   Ramp echo was done today, at 5600 rpm the LV was small and the septum with some left shift, aortic valve opening 3/5 beats.  At 5500 rpm, the LV was larger and septum more midline, aortic valve still opening 3/5 beats.  The RV was moderately enlarged with normal systolic function. I left speed at 5500 rpm.   Denies LVAD alarms.  Reports taking Coumadin as prescribed and adherence to anticoagulation based dietary restrictions.  Denies bright red blood per rectum or melena, no dark urine or hematuria.    ECG (personally reviewed): Sinus tachy with PACs  Labs (12/21): K 4.1, creatinine 0.73, INR 2.4, hgb 12.3, LDH 194, LFTs normal, TSH normal Labs (1/22): hgb 14.4, creatinine 0.91, LDH 197 Labs (5/22): K 3.8, creatinine 1.44, hgb 15, LDH 177 Labs (10/22): K 4, creatinine 1.01 => 0.76, hgb 13.8, LDL 61, LDH 152 Labs (12/22): free T4 elevated with low TSH, creatinine 0.87.    Past Medical History:  Diagnosis Date   "    Arthritis  CAD (coronary artery disease)    a. s/p CABG in 11/2019 with LIMA-LAD, SVG-OM1, SVG-PDA and SVG-D1   CHF (congestive heart failure) ([place])    a. EF < 20% by echo in 11/2019   Essential hypertension    PAF (paroxysmal atrial fibrillation) ([facility])    Type 2 diabetes mellitus ([facility])     Current Outpatient Medications  Medication Sig Dispense Refill   albuterol (PROVENTIL) (2.5 MG/3ML) 0.083% nebulizer solution INHALE 3 ML BY NEBULIZATION EVERY 6 HOURS AS NEEDED FOR WHEEZING OR SHORTNESS OF BREATH 450 mL 1   allopurinol (ZYLOPRIM) 100 MG tablet Take 1 tablet (100 mg total) by mouth daily. 90 tablet 3   colchicine 0.6 MG tablet TAKE 1 TABLET (0.6 MG TOTAL) BY MOUTH AS NEEDED. (Patient not taking: Reported on 01/09/2022) 180 tablet 1   Fluticasone-Umeclidin-Vilant (TRELEGY ELLIPTA) 100-62.5-25 MCG/INH  AEPB Inhale 1 puff into the lungs daily. 60 each 6   furosemide (LASIX) 40 MG tablet TAKE 0.5 TABLETS (20 MG TOTAL) BY MOUTH DAILY AS NEEDED. 45 tablet 3   gabapentin (NEURONTIN) 600 MG tablet Take 600 mg by mouth 2 (two) times daily.     guaiFENesin (MUCINEX) 600 MG 12 hr tablet Take 1 tablet (600 mg total) by mouth 2 (two) times daily. 60 tablet 6   loratadine (CLARITIN) 10 MG tablet Take 1 tablet (10 mg total) by mouth daily. 30 tablet 6   losartan (COZAAR) 25 MG tablet Take 0.5 tablets (12.5 mg total) by mouth daily. 30 tablet 6   metFORMIN (GLUCOPHAGE) 1000 MG tablet TAKE 1 TABLET (1,000 MG TOTAL) BY MOUTH IN THE MORNING AND AT BEDTIME. 180 tablet 3   metoprolol succinate (TOPROL XL) 25 MG 24 hr tablet Take 1 tablet (25 mg total) by mouth daily. 30 tablet 6   Multiple Vitamin (MULTIVITAMIN WITH MINERALS) TABS tablet Take 1 tablet by mouth daily.     OLANZapine (ZYPREXA) 5 MG tablet Take 5 mg by mouth at bedtime.     pantoprazole (PROTONIX) 40 MG tablet TAKE 1 TABLET BY MOUTH EVERY DAY 90 tablet 2   rosuvastatin (CRESTOR) 20 MG tablet Take 20 mg by mouth daily.     Semaglutide (OZEMPIC, 0.25 OR 0.5 MG/DOSE, [place]) Inject 0.5 mg into the skin once a week.     sertraline (ZOLOFT) 50 MG tablet Take 1 tablet (50 mg total) by mouth daily. 90 tablet 3   tamsulosin (FLOMAX) 0.4 MG CAPS capsule Take 1 capsule (0.4 mg total) by mouth daily. 30 capsule 6   Tetrahydrozoline HCl (REDNESS RELIEVER EYE DROPS OP) Apply 2 drops to eye daily as needed.     traZODone (DESYREL) 150 MG tablet Take 1 tablet (150 mg total) by mouth at bedtime. 90 tablet 3   warfarin (COUMADIN) 4 MG tablet On 10/5 take 4 mg then resume 6 mg (1.5 tabs) on Mon/Wed/Fri and 4 mg (1 tab) all other days or as directed by [facility] Clinic 60 tablet 11   blood glucose meter kit and supplies KIT Dispense based on patient and insurance preference. Use to check CBG's three times a day. (FOR ICD-9 250.00, 250.01). (Patient not taking: Reported on  12/04/2021) 1 each 0   cyclobenzaprine (FLEXERIL) 5 MG tablet Take 1-2 tablets (5-10 mg total) by mouth 2 (two) times daily as needed for muscle spasms. (Patient not taking: Reported on 07/12/2021) 45 tablet 3   methimazole (TAPAZOLE) 10 MG tablet Take 1 tablet (10 mg total) by mouth 2 (two) times daily. 60 tablet 6  No current facility-administered medications for this encounter.    Patient has no known allergies.  REVIEW OF SYSTEMS: All systems negative except as listed in HPI, PMH and Problem list.   LVAD INTERROGATION:  See nurse's note above.   I reviewed the LVAD parameters from today, and compared the results to the patient's prior recorded data.  No programming changes were made.  The LVAD is functioning within specified parameters.  The patient performs LVAD self-test daily.  LVAD interrogation was negative for any significant power changes, alarms or PI events/speed drops.  LVAD equipment check completed and is in good working order.  Back-up equipment present.   LVAD education done on emergency procedures and precautions and reviewed exit site care.    Vitals:   01/09/22 1128 01/09/22 1129  BP: (!) 78/0 110/63  Pulse:  (!) 107  SpO2: 96%   Weight: 81.4 kg (179 lb 6.4 oz)   MAP 82  Physical Exam: General: Well appearing this am. NAD.  HEENT: Normal. Neck: Supple, JVP 7-8 cm. Carotids OK.  Cardiac:  Mechanical heart sounds with LVAD hum present.  Lungs:  CTAB, normal effort.  Abdomen:  NT, ND, no HSM. No bruits or masses. +BS  LVAD exit site: Well-healed and incorporated. Dressing dry and intact. No erythema or drainage. Stabilization device present and accurately applied. Driveline dressing changed daily per sterile technique. Extremities:  Warm and dry. No cyanosis, clubbing, rash, or edema.  Neuro:  Alert & oriented x 3. Cranial nerves grossly intact. Moves all 4 extremities w/o difficulty. Affect pleasant    ASSESSMENT AND PLAN: 1. Chronic systolic CHF: Echo 31/51  with EF 20-25%, mildly decreased RV function. LHC/RHC in 12/21 with patent grafts, low output. Suspect mixed ischemic/nonischemic cardiomyopathy (prior heavy ETOH and drugs as well as CAD).  No ETOH, drugs, smoking since CABG in 12/20. Admitted with cardiogenic shock in 12/21, had placement of Impella 5.5 initially, now s/p Heartmate 3 LVAD on 11/17/20.  Ramp echo today with speed decreased to 5500 rpm.  NYHA class II-III.  Not volume overloaded on exam with multiple PI events on device interrogation. MAP stable but has lightheaded episodes.  - With multiple PI events and lightheadedness, stop Entresto and start losartan 12.5 mg daily.    - Encourage hydration with many PI events.  - Will plan ramp echo at next appointment in 1 month.  - Continue Coumadin, INR goal 2-2.5.  - I suspect that hyperthyroidism has a role in his fatigue (see below).  2. CAD: S/p CABG 12/20.  LHC pre-VAD with patent grafts, no target for intervention. - Continue statin, good lipids in 10/22. 3. Atrial fibrillation: Paroxysmal.  S/p Maze and LA appendage clip with CABG in 12/20.  In sinus tachy today.  - Off amiodarone with hyperthyroidism. - Add Toprol XL 25 mg daily.   4. Type 2 diabetes: Close followup with PCP.   5. Prior ETOH, drugs: None x  >1 year.  6. Gout: Recent flare, resolved.  - Continue allopurinol 100 mg daily.  7. PACs/PVCs: Still with frequent PACs.  8. COPD: H/o COPD exacerbation, overall feels better on Trelegy.  - Followup with pulmonary.  9. Hyperthyroidism: This may explain weight loss and fatigue.  Likely related to amiodarone.  Still symptomatic and with sinus tachycardia.  - He is now off amiodarone.   - Repeat TSH, free T3/T4 today. - Increase methimazole to 10 mg bid.   - Still waiting for endocrinology appt, will try to expedite.  Followup in 1 month.   Loralie Champagne 01/10/2022

## 2022-01-09 NOTE — Patient Instructions (Addendum)
Increase Methimazole to 10 mg twice daily (for thyroid) Stop Entresto Start Losartan 12.5 mg daily (for blood pressure) Start Toprol XL 25 mg daily (for heart rate) Start Flomax 0.4 mg daily (for difficulty starting urinary stream) Coumadin dosing per Lauren PharmD Return to VAD clinic in 1 month for follow up with Dr Shirlee Latch We decreased your VAD speed to 5500 today If you experience increased dizziness, lightheadedness, or don't feel well with medication changes please call VAD coordinators

## 2022-01-10 ENCOUNTER — Telehealth (HOSPITAL_COMMUNITY): Payer: Self-pay | Admitting: Unknown Physician Specialty

## 2022-01-10 LAB — T3, FREE: T3, Free: 4.4 pg/mL (ref 2.0–4.4)

## 2022-01-10 NOTE — Telephone Encounter (Signed)
Received call from wife stating that the pt "passed out". Traci states that the pt got up to go to the bathroom and she said that he was standing in the doorway when he fell completely backwards onto the ground. The wife states that the pt did not hit his head. Traci states that she stayed with him on the floor until he was able to safely stand. Traci states that the pt has been dizzy today on and off. She confirms that they stopped the [facility] and started the losartan today and Toprol. D/w Dr. Shirlee Latch and instructed pt to stop Losartan and Flomax but to continue the toprol. Pts flow was 4.2 and the PI was 2.9. pt/wife was instructed that if he passes out again in the next 24 hrs to page the VAD pager. Wife verbalized understanding of all instructions and read back to me all medications changes.  Carlton Adam RN, BSN VAD Coordinator 24/7 Pager [phone]

## 2022-01-21 ENCOUNTER — Ambulatory Visit (HOSPITAL_COMMUNITY): Payer: Self-pay | Admitting: Pharmacist

## 2022-01-21 LAB — POCT INR: INR: 2.6 (ref 2.0–3.0)

## 2022-01-21 NOTE — Progress Notes (Signed)
LVAD INR 

## 2022-01-22 ENCOUNTER — Encounter: Payer: Self-pay | Admitting: Endocrinology

## 2022-01-22 ENCOUNTER — Other Ambulatory Visit: Payer: Self-pay

## 2022-01-22 ENCOUNTER — Ambulatory Visit (INDEPENDENT_AMBULATORY_CARE_PROVIDER_SITE_OTHER): Payer: 59 | Admitting: Endocrinology

## 2022-01-22 DIAGNOSIS — I5022 Chronic systolic (congestive) heart failure: Secondary | ICD-10-CM

## 2022-01-22 DIAGNOSIS — Z95811 Presence of heart assist device: Secondary | ICD-10-CM

## 2022-01-22 DIAGNOSIS — E0590 Thyrotoxicosis, unspecified without thyrotoxic crisis or storm: Secondary | ICD-10-CM

## 2022-01-22 LAB — T4, FREE: Free T4: 2.97 ng/dL — ABNORMAL HIGH (ref 0.60–1.60)

## 2022-01-22 LAB — T3, FREE: T3, Free: 3.8 pg/mL (ref 2.3–4.2)

## 2022-01-22 LAB — TSH: TSH: <0.01 u[IU]/mL — ABNORMAL LOW (ref 0.35–5.50)

## 2022-01-22 MED ORDER — METHIMAZOLE 10 MG PO TABS: 20.0000 mg | ORAL_TABLET | Freq: Two times a day (BID) | ORAL | 6 refills | Status: DC

## 2022-01-22 NOTE — Progress Notes (Signed)
Subjective:    Patient ID: Kevin Underwood, male    DOB: 05-03-1961, 61 y.o.   MRN: [id number]  HPI Pt is referred by Dr Shirlee Latch, for hyperthyroidism.  Pt reports he was dx'ed with hyperthyroidism in 1/23.   He was rx'ed tapazole.  He has never had XRT to the anterior neck, or thyroid surgery.  He has never had dedicated thyroid imaging.  He does not consume non-prescribed thyroid medication.  He reports lightheadedness, anxiety, tremor, and weight loss (10 lbs x 1 month).   Past Medical History:  Diagnosis Date   "    Arthritis    CAD (coronary artery disease)    a. s/p CABG in 11/2019 with LIMA-LAD, SVG-OM1, SVG-PDA and SVG-D1   CHF (congestive heart failure) (HCC)    a. EF < 20% by echo in 11/2019   Essential hypertension    PAF (paroxysmal atrial fibrillation) (HCC)    Type 2 diabetes mellitus (HCC)     Past Surgical History:  Procedure Laterality Date   BACK SURGERY     CLIPPING OF ATRIAL APPENDAGE N/A 11/26/2019   Procedure: Clipping Of Atrial Appendage using AtriCure 40 Clip;  Surgeon: [name], MD;  Location: [facility];  Service: Open Heart Surgery;  Laterality: N/A;   CORONARY ARTERY BYPASS GRAFT N/A 11/26/2019   Procedure: CORONARY ARTERY BYPASS GRAFTING (CABG) using endoscopic greater saphenous vein harvest: svc to OM; svc to Diag; svc to PD; and LIMA to LAD.;  Surgeon: [name], MD;  Location: [facility];  Service: Open Heart Surgery;  Laterality: N/A;   FOOT SURGERY     HAND SURGERY     INSERTION OF IMPLANTABLE LEFT VENTRICULAR ASSIST DEVICE N/A 11/17/2020   Procedure: INSERTION OF IMPLANTABLE LEFT VENTRICULAR ASSIST DEVICE - HM3;  Surgeon: [name], MD;  Location: [facility];  Service: Open Heart Surgery;  Laterality: N/A;   INTRAOPERATIVE TRANSESOPHAGEAL ECHOCARDIOGRAM  12/04/2019   Procedure: Intraoperative Transesophageal Echocardiogram;  Surgeon: [name], MD;  Location: [facility];  Service: Open Heart Surgery;;   MAZE N/A 11/26/2019   Procedure:  MAZE using Bilateral Pulmonary Vein isolation.;  Surgeon: [name], MD;  [facility];  Service: Open Heart Surgery;  Laterality: N/A;   MULTIPLE EXTRACTIONS WITH ALVEOLOPLASTY N/A 11/16/2020   Procedure: MULTIPLE EXTRACTION WITH ALVEOLOPLASTY;  Surgeon: Sharman Cheek, DMD;  Location[facility];  Service: Dentistry;  Laterality: N/A;   PLACEMENT OF IMPELLA LEFT VENTRICULAR ASSIST DEVICE N/A 11/24/2019   Procedure: PLACEMENT OF IMPELLA 5.5 LEFT VENTRICULAR ASSIST DEVICE;  Surgeon: [name], MD;  [facility];  Service: Open Heart Surgery;  Laterality: N/A;   PLACEMENT OF IMPELLA LEFT VENTRICULAR ASSIST DEVICE Left 11/10/2020   Procedure: PLACEMENT OF IMPELLA 5.5 LEFT VENTRICULAR ASSIST DEVICE VIA  LEFT AVILLARY ARTERY;  Surgeon: [name], MD;  [facility];  Service: Open Heart Surgery;  Laterality: Left;   REMOVAL OF IMPELLA LEFT VENTRICULAR ASSIST DEVICE Right 12/04/2019   Procedure: REMOVAL OF IMPELLA LEFT VENTRICULAR ASSIST DEVICE, right axilla;  Surgeon: [name], MD;  Location: [facility] OR;  Service: Open Heart Surgery;  Laterality: Right;   RIGHT/LEFT HEART CATH AND CORONARY ANGIOGRAPHY N/A 11/24/2019   Procedure: RIGHT/LEFT HEART CATH AND CORONARY ANGIOGRAPHY;  Surgeon: Laurey Morale, MD;  Location: [facility] INVASIVE CV LAB;  Service: Cardiovascular;  Laterality: N/A;   RIGHT/LEFT HEART CATH AND CORONARY/GRAFT ANGIOGRAPHY N/A 11/09/2020   Procedure: RIGHT/LEFT HEART CATH AND CORONARY/GRAFT ANGIOGRAPHY;  Surgeon: Nelva Bush, MD;  Location: [place] CV LAB;  Service: Cardiovascular;  Laterality: N/A;   TEE WITHOUT CARDIOVERSION N/A 11/24/2019   Procedure: TRANSESOPHAGEAL ECHOCARDIOGRAM (TEE);  Surgeon: Wonda Olds, MD;  Location: [place];  Service: Open Heart Surgery;  Laterality: N/A;   TEE WITHOUT CARDIOVERSION N/A 11/26/2019   Procedure: TRANSESOPHAGEAL ECHOCARDIOGRAM (TEE);  Surgeon: Wonda Olds, MD;  Location: [place];  Service: Open Heart  Surgery;  Laterality: N/A;   TEE WITHOUT CARDIOVERSION N/A 11/10/2020   Procedure: TRANSESOPHAGEAL ECHOCARDIOGRAM (TEE);  Surgeon: Wonda Olds, MD;  Location: [place];  Service: Open Heart Surgery;  Laterality: N/A;   TEE WITHOUT CARDIOVERSION N/A 11/17/2020   Procedure: TRANSESOPHAGEAL ECHOCARDIOGRAM (TEE);  Surgeon: Wonda Olds, MD;  Location: [place];  Service: Open Heart Surgery;  Laterality: N/A;    Social History   Socioeconomic History   Marital status: Married    Spouse name: Not on file   Number of children: Not on file   Years of education: 12   Highest education level: Not on file  Occupational History   Not on file  Tobacco Use   Smoking status: Former    Packs/day: 1.00    Years: 30.00    Pack years: 30.00    Types: Cigarettes    Quit date: 12/20/2019    Years since quitting: 2.1   Smokeless tobacco: Never  Vaping Use   Vaping Use: Never used  Substance and Sexual Activity   Alcohol use: Yes    Comment: Prior history of excessive intake   Drug use: No   Sexual activity: Not on file  Other Topics Concern   Not on file  Social History Narrative   Not on file   Social Determinants of Health   Financial Resource Strain: Low Risk    Difficulty of Paying Living Expenses: Not very hard  Food Insecurity: No Food Insecurity   Worried About Running Out of Food in the Last Year: Never true   Ran Out of Food in the Last Year: Never true  Transportation Needs: Unmet Transportation Needs   Lack of Transportation (Medical): Yes   Lack of Transportation (Non-Medical): No  Physical Activity: Not on file  Stress: Not on file  Social Connections: Not on file  Intimate Partner Violence: Not on file    Current Outpatient Medications on File Prior to Visit  Medication Sig Dispense Refill   albuterol (PROVENTIL) (2.5 MG/3ML) 0.083% nebulizer solution INHALE 3 ML BY NEBULIZATION EVERY 6 HOURS AS NEEDED FOR WHEEZING OR SHORTNESS OF BREATH 450 mL 1   allopurinol  (ZYLOPRIM) 100 MG tablet Take 1 tablet (100 mg total) by mouth daily. 90 tablet 3   blood glucose meter kit and supplies KIT Dispense based on patient and insurance preference. Use to check CBG's three times a day. (FOR ICD-9 250.00, 250.01). 1 each 0   colchicine 0.6 MG tablet TAKE 1 TABLET (0.6 MG TOTAL) BY MOUTH AS NEEDED. 180 tablet 1   cyclobenzaprine (FLEXERIL) 5 MG tablet Take 1-2 tablets (5-10 mg total) by mouth 2 (two) times daily as needed for muscle spasms. 45 tablet 3   Fluticasone-Umeclidin-Vilant (TRELEGY ELLIPTA) 100-62.5-25 MCG/INH AEPB Inhale 1 puff into the lungs daily. 60 each 6   furosemide (LASIX) 40 MG tablet TAKE 0.5 TABLETS (20 MG TOTAL) BY MOUTH DAILY AS NEEDED. 45 tablet 3   gabapentin (NEURONTIN) 600 MG tablet Take 600 mg by mouth 2 (two) times daily.     guaiFENesin ([place])  600 MG 12 hr tablet Take 1 tablet (600 mg total) by mouth 2 (two) times daily. 60 tablet 6   loratadine (CLARITIN) 10 MG tablet Take 1 tablet (10 mg total) by mouth daily. 30 tablet 6   metFORMIN (GLUCOPHAGE) 1000 MG tablet TAKE 1 TABLET (1,000 MG TOTAL) BY MOUTH IN THE MORNING AND AT BEDTIME. 180 tablet 3   metoprolol succinate (TOPROL XL) 25 MG 24 hr tablet Take 1 tablet (25 mg total) by mouth daily. 30 tablet 6   Multiple Vitamin (MULTIVITAMIN WITH MINERALS) TABS tablet Take 1 tablet by mouth daily.     OLANZapine (ZYPREXA) 5 MG tablet Take 5 mg by mouth at bedtime.     pantoprazole (PROTONIX) 40 MG tablet TAKE 1 TABLET BY MOUTH EVERY DAY 90 tablet 2   rosuvastatin (CRESTOR) 20 MG tablet Take 20 mg by mouth daily.     Semaglutide (OZEMPIC, 0.25 OR 0.5 MG/DOSE, [place]) Inject 0.5 mg into the skin once a week.     sertraline (ZOLOFT) 50 MG tablet Take 1 tablet (50 mg total) by mouth daily. 90 tablet 3   Tetrahydrozoline HCl (REDNESS RELIEVER EYE DROPS OP) Apply 2 drops to eye daily as needed.     traZODone (DESYREL) 150 MG tablet Take 1 tablet (150 mg total) by mouth at bedtime. 90 tablet 3   warfarin  (COUMADIN) 4 MG tablet On 10/5 take 4 mg then resume 6 mg (1.5 tabs) on Mon/Wed/Fri and 4 mg (1 tab) all other days or as directed by HF Clinic 60 tablet 11   [DISCONTINUED] enoxaparin (LOVENOX) 40 MG/0.4ML injection INJECT 0.4 MLS (40 MG TOTAL) INTO THE SKIN EVERY 12 (TWELVE) HOURS. 10 mL 2   No current facility-administered medications on file prior to visit.    No Known Allergies  Family History  Problem Relation Age of Onset   Heart disease Sister        Tumor?   Arthritis Other    Lung disease Other    Asthma Other    Diabetes Other    Thyroid disease Neg Hx     BP 96/80    Pulse (!) 102    Ht $R'5\' 10"'KO$  (1.778 m)    Wt 181 lb 3.2 oz (82.2 kg)    SpO2 95%    BMI 26.00 kg/m    Review of Systems denies palpitations, sob, excessive diaphoresis, and heat intolerance.      Objective:   Physical Exam VS: see vs page GEN: no distress HEAD: head: no deformity eyes: no periorbital swelling; slight bilat proptosis.   external nose and ears are normal NECK: supple, thyroid is not enlarged CHEST WALL: no deformity LUNGS: clear to auscultation CV: reg rate and rhythm, no murmur., but mechanical sound is heard ABD: input of the heart pump is noted.  MUSCULOSKELETAL: gait is normal and steady EXTEMITIES: no deformity.  no leg edema NEURO:  readily moves all 4's.  sensation is intact to touch on all 4's.  No tremor SKIN:  Normal texture and temperature.  No rash or suspicious lesion is visible.  Not diaphoretic NODES:  None palpable at the neck PSYCH: alert, well-oriented.  Does not appear anxious nor depressed.     Lab Results  Component Value Date   TSH <0.01 Repeated and verified X2. (L) 01/22/2022   CT (2021): no mention is made of the thyroid  I have reviewed outside records, and summarized: Pt was noted to have hyperthyroidism, and referred here.  He was admitted  with CHF and AF. Amiodarone was stopped 12/22.       Assessment & Plan:  Hyperthyroidism: uncontrolled.   Labs show lingering effects of the amiodarone.  Off it, he may be able to d/c tapazole.    Patient Instructions  Blood tests are requested for you today.  We'll let you know about the results.  If ever you have fever while taking methimazole, stop it and call us, even if the reason is obvious, because of the risk of a rare side-effect. It is best to never miss the medication.  However, if you do miss it, next best is to double up the next time.   Please come back for a follow-up appointment in 3 weeks.   Our plan is to taper off the methimazole if we can.  However, that might take time.

## 2022-01-22 NOTE — Patient Instructions (Addendum)
Blood tests are requested for you today.  We'll let you know about the results.  If ever you have fever while taking methimazole, stop it and call us, even if the reason is obvious, because of the risk of a rare side-effect. It is best to never miss the medication.  However, if you do miss it, next best is to double up the next time.   Please come back for a follow-up appointment in 3 weeks.   Our plan is to taper off the methimazole if we can.  However, that might take time.

## 2022-02-05 ENCOUNTER — Other Ambulatory Visit (HOSPITAL_COMMUNITY): Payer: Self-pay | Admitting: Unknown Physician Specialty

## 2022-02-05 DIAGNOSIS — Z95811 Presence of heart assist device: Secondary | ICD-10-CM

## 2022-02-05 DIAGNOSIS — I5022 Chronic systolic (congestive) heart failure: Secondary | ICD-10-CM

## 2022-02-06 ENCOUNTER — Encounter (HOSPITAL_COMMUNITY): Payer: 59

## 2022-02-07 ENCOUNTER — Ambulatory Visit (HOSPITAL_COMMUNITY): Payer: Self-pay | Admitting: Pharmacist

## 2022-02-07 LAB — POCT INR: INR: 2.8 (ref 2.0–3.0)

## 2022-02-07 NOTE — Progress Notes (Signed)
LVAD INR 

## 2022-02-13 ENCOUNTER — Ambulatory Visit (HOSPITAL_COMMUNITY): Payer: Self-pay | Admitting: Pharmacist

## 2022-02-13 ENCOUNTER — Ambulatory Visit: Payer: 59 | Admitting: Endocrinology

## 2022-02-13 LAB — POCT INR: INR: 2.4 (ref 2.0–3.0)

## 2022-02-20 ENCOUNTER — Encounter (HOSPITAL_COMMUNITY): Payer: 59

## 2022-02-20 ENCOUNTER — Telehealth (HOSPITAL_COMMUNITY): Payer: Self-pay | Admitting: *Deleted

## 2022-02-20 NOTE — Telephone Encounter (Signed)
Received call from patient's mother canceling his 1 month f/u appt for today due to lack of transportation. Both pt's and mom's car are not working. She is requesting a ride be set up for him to come to clinic. Made her aware that the [facility] Transportation program is no longer available.  ? ?Pt has [facility] Medicare and E-Medicaid. Discussed need to call insurance company (number on back of insurance card) to discuss ride benefits available. Appt rescheduled for 03/11/22 at 11:00 to give pt time to plan for transportation / have funds available to repair vehicle(s). She verbalized understanding to all the above.  ? ?Alyce Pagan RN ?VAD Coordinator  ?Office: [phone]  ?24/7 Pager: [phone]  ? ?

## 2022-03-08 ENCOUNTER — Other Ambulatory Visit (HOSPITAL_COMMUNITY): Payer: Self-pay | Admitting: *Deleted

## 2022-03-08 DIAGNOSIS — I5022 Chronic systolic (congestive) heart failure: Secondary | ICD-10-CM

## 2022-03-08 DIAGNOSIS — E0590 Thyrotoxicosis, unspecified without thyrotoxic crisis or storm: Secondary | ICD-10-CM

## 2022-03-11 ENCOUNTER — Ambulatory Visit (HOSPITAL_COMMUNITY): Payer: Self-pay | Admitting: Pharmacist

## 2022-03-11 ENCOUNTER — Ambulatory Visit (HOSPITAL_COMMUNITY)
Admission: RE | Admit: 2022-03-11 | Discharge: 2022-03-11 | Disposition: A | Discharge status: Home / Self Care | Payer: 59 | Source: Ambulatory Visit | Attending: Internal Medicine | Admitting: Internal Medicine

## 2022-03-11 DIAGNOSIS — I5022 Chronic systolic (congestive) heart failure: Secondary | ICD-10-CM

## 2022-03-11 DIAGNOSIS — Z951 Presence of aortocoronary bypass graft: Secondary | ICD-10-CM | POA: Diagnosis not present

## 2022-03-11 DIAGNOSIS — Z452 Encounter for adjustment and management of vascular access device: Secondary | ICD-10-CM | POA: Diagnosis not present

## 2022-03-11 DIAGNOSIS — Z95811 Presence of heart assist device: Secondary | ICD-10-CM | POA: Diagnosis not present

## 2022-03-11 DIAGNOSIS — E119 Type 2 diabetes mellitus without complications: Secondary | ICD-10-CM | POA: Diagnosis not present

## 2022-03-11 DIAGNOSIS — Z79890 Hormone replacement therapy (postmenopausal): Secondary | ICD-10-CM | POA: Diagnosis not present

## 2022-03-11 DIAGNOSIS — J449 Chronic obstructive pulmonary disease, unspecified: Secondary | ICD-10-CM | POA: Insufficient documentation

## 2022-03-11 DIAGNOSIS — G4700 Insomnia, unspecified: Secondary | ICD-10-CM

## 2022-03-11 DIAGNOSIS — Z7901 Long term (current) use of anticoagulants: Secondary | ICD-10-CM | POA: Insufficient documentation

## 2022-03-11 DIAGNOSIS — E0590 Thyrotoxicosis, unspecified without thyrotoxic crisis or storm: Secondary | ICD-10-CM | POA: Diagnosis not present

## 2022-03-11 DIAGNOSIS — Z7984 Long term (current) use of oral hypoglycemic drugs: Secondary | ICD-10-CM | POA: Diagnosis not present

## 2022-03-11 DIAGNOSIS — I480 Paroxysmal atrial fibrillation: Secondary | ICD-10-CM | POA: Insufficient documentation

## 2022-03-11 DIAGNOSIS — Z79899 Other long term (current) drug therapy: Secondary | ICD-10-CM | POA: Diagnosis not present

## 2022-03-11 DIAGNOSIS — I493 Ventricular premature depolarization: Secondary | ICD-10-CM | POA: Diagnosis not present

## 2022-03-11 DIAGNOSIS — M109 Gout, unspecified: Secondary | ICD-10-CM | POA: Diagnosis not present

## 2022-03-11 DIAGNOSIS — Z7951 Long term (current) use of inhaled steroids: Secondary | ICD-10-CM | POA: Insufficient documentation

## 2022-03-11 DIAGNOSIS — I2510 Atherosclerotic heart disease of native coronary artery without angina pectoris: Secondary | ICD-10-CM | POA: Diagnosis not present

## 2022-03-11 DIAGNOSIS — I110 Hypertensive heart disease with heart failure: Secondary | ICD-10-CM | POA: Insufficient documentation

## 2022-03-11 LAB — CBC
HCT: 37.6 % — ABNORMAL LOW (ref 39.0–52.0)
Hemoglobin: 12.5 g/dL — ABNORMAL LOW (ref 13.0–17.0)
MCH: 27.5 pg (ref 26.0–34.0)
MCHC: 33.2 g/dL (ref 30.0–36.0)
MCV: 82.8 fL (ref 80.0–100.0)
Platelets: 253 10*3/uL (ref 150–400)
RBC: 4.54 MIL/uL (ref 4.22–5.81)
RDW: 13.3 % (ref 11.5–15.5)
WBC: 9.6 10*3/uL (ref 4.0–10.5)
nRBC: 0 % (ref 0.0–0.2)

## 2022-03-11 LAB — BASIC METABOLIC PANEL
Anion gap: 11 (ref 5–15)
BUN: 16 mg/dL (ref 8–23)
CO2: 25 mmol/L (ref 22–32)
Calcium: 9 mg/dL (ref 8.9–10.3)
Chloride: 99 mmol/L (ref 98–111)
Creatinine, Ser: 1.07 mg/dL (ref 0.61–1.24)
GFR, Estimated: >60 mL/min (ref >60)
Glucose, Bld: 187 mg/dL — ABNORMAL HIGH (ref 70–99)
Potassium: 3.4 mmol/L — ABNORMAL LOW (ref 3.5–5.1)
Sodium: 135 mmol/L (ref 135–145)

## 2022-03-11 LAB — T4, FREE: Free T4: 1.51 ng/dL — ABNORMAL HIGH (ref 0.61–1.12)

## 2022-03-11 LAB — PROTIME-INR
INR: 1.9 — ABNORMAL HIGH (ref 0.8–1.2)
Prothrombin Time: 21.8 seconds — ABNORMAL HIGH (ref 11.4–15.2)

## 2022-03-11 LAB — POCT INR: INR: 1.9 — AB (ref 2.0–3.0)

## 2022-03-11 LAB — TSH: TSH: 0.135 u[IU]/mL — ABNORMAL LOW (ref 0.350–4.500)

## 2022-03-11 LAB — LACTATE DEHYDROGENASE: LDH: 178 U/L (ref 98–192)

## 2022-03-11 MED ORDER — TRAZODONE HCL 150 MG PO TABS: 150.0000 mg | ORAL_TABLET | Freq: Every day | ORAL | 3 refills | Status: DC

## 2022-03-11 NOTE — Progress Notes (Signed)
LVAD INR 

## 2022-03-11 NOTE — Patient Instructions (Signed)
No med changes  ?Return to clinic in 2 months ?

## 2022-03-11 NOTE — Progress Notes (Addendum)
Patient presents for 2 month f/u with annual maintenance today with his wife [name]. Reports no problems with VAD equipment or concerns with drive line. ? ?Patient reports that he is feeling much better recently. Denies falls, heart failure symptoms, shortness of breath, and signs of bleeding. Reports intermittent "really bad" headaches that start in his neck, up into the back of his head. This resolves with use of an ice pack. Per Dr Aundra Dubin these may be hyperthyroidism related.  ?  ?Per Dr Aundra Dubin low energy level may be related to hyperthyroidism. Currently taking Methimazole 10 mg daily as instructed. Per Dr Aundra Dubin will increase to Methimazole 10 mg BID. TSH/T4/T3 obtained today. Pt was a no show at his appt with Endo on 3/8. TSH and T4 checked in clinic today. TSH pending. ? ?[facility] trazodone due to med interactions. Per Ander Purpura pt is ok to take Trazodone with current medications. I have resent his Trazodone to the pharmacy. ? ?Pt states that the pharmacy will not fill his Ozempic 0.$RemoveBefor'5mg'KfGTCbpsUtpH$  SQ weekly by PCP for diabetes management either.  ? ?Pt/wife tell me in clinic today that they cannot check his INR at home because they cannot get the company to send the supplies. I have reached out to [employer] rep to see how we can help with this issue. ? ?Vital Signs:  ?Doppler Pressure: 98 ?Automatc BP: 110/63 (97) ?HR: 90 ?SPO2: 98 ?   ?Weight: 182 lb w/ eqt ?Last weight: 179.4 lb  ? ?Home weights: 190-192 lbs ? ?VAD Indication: ?Destination Therapy. HM III implanted 11/17/20 by Dr Orvan Seen ?  ?VAD interrogation: ?Speed: 0109 ?Flow: 4.3 ?Power: 4.3 w    ?PI: 3.5 ?Hct: 37 ? ?Alarms: none  ?Events: 10 - 30 daily ? ?Fixed speed: 5500 ?Low speed limit: 5200 ? ?Primary controller: replace back up battery in 17 months ?Back up controller: replace back up battery in 20 months ?  ?I reviewed the LVAD parameters from today and compared the results to the patient's prior recorded data. LVAD  interrogation was NEGATIVE for significant power changes, NEGATIVE for clinical alarms and STABLE for PI events/speed drops. No programming changes were made and pump is functioning within specified parameters. Pt is performing daily controller and system monitor self tests along with completing weekly and monthly maintenance for LVAD equipment. ?  ?LVAD equipment check completed and is in good working order. Back-up equipment present. Charged back up battery and performed self-test on equipment.  ?  ?Annual Equipment Maintenance on UBC/PM was performed on 01/09/2022.  ? ?Exit Site Care: ?Drive line is being maintained weekly by patient's wife [name]. Dressing is clean, dry, and intact. Anchor correctly applied. Provided with 8 weekly kits for home use.  ? ?BP & Labs:  ?Doppler 98 - Doppler is reflecting MAP ?  ?Hgb 12.5 - No S/S of bleeding. Specifically denies melena/BRBPR or nosebleeds. Occasional blood when wiping.  ?  ?LDH 178 -  with established baseline of 190 - 360. Denies tea-colored urine. No power elevations noted on interrogation.   ? ? ?Plan: ?No med changes  ?Return to clinic in 2 months ? ?[name] RN ?VAD Coordinator  ?Office: [phone]  ?24/7 Pager: [phone]  ? ?Follow up for Heart Failure/LVAD: ? ?61 y.o. 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 type 2 diabetes mellitus, COPD,  and hypertension. Now s/p Heartmate 3 LVAD.  ? ?Patient had echo in 10/21 with EF 20-25%.  He was admitted in 12/21 with cardiogenic shock.  Cath showed patent grafts and low cardiac output.  Suspect mixed ischemic/nonischemic cardiomyopathy, prior heavy ETOH though no ETOH or smoking since 12/20 CABG.  He required stabilization with Impella 5.5 and ultimately had placement of Heartmate 3 LVAD.  Paroxysmal atrial fibrillation noted post-op, but  generally stable course and discharged home.   ? ?Patient was admitted in 10/22 with COPD exacerbation.  W[place] Glade was stopped with frequent PIs.  He was treated with steroids, nebs, antibiotics with improvement.  ? ?Ramp echo was done in 2/23.  At 5600 rpm the LV was small and the septum with some left shift, aortic valve opening 3/5 beats.  At 5500 rpm, the LV was larger and septum more midline, aortic valve still opening 3/5 beats.  The RV was moderately enlarged with normal systolic function. I left speed at 5500 rpm.  ? ?Patient returns for followup of CHF/LVAD. He has been doing better. Lightheaded with standing only rarely.  No dyspnea walking on flat ground.  He has felt tired/fatigued but has been sleeping poorly since he ran out of trazodone.  Pharmacy would not refill.  He is off losartan due to presyncope.  MAP 97 today.  Weight up 3 lbs.  Fewer PI events on device interrogation.  ? ?Denies LVAD alarms.  Reports taking Coumadin as prescribed and adherence to anticoagulation based dietary restrictions.  Denies bright red blood per rectum or melena, no dark urine or hematuria.   ? ?ECG (personally reviewed): Sinus tachy with PACs ? ?Labs (12/21): K 4.1, creatinine 0.73, INR 2.4, hgb 12.3, LDH 194, LFTs normal, TSH normal ?Labs (1/22): hgb 14.4, creatinine 0.91, LDH 197 ?Labs (5/22): K 3.8, creatinine 1.44, hgb 15, LDH 177 ?Labs (10/22): K 4, creatinine 1.01 => 0.76, hgb 13.8, LDL 61, LDH 152 ?Labs (12/22): free T4 elevated with low TSH, creatinine 0.87.  ?Labs (4/23): Free T4 1.51, TSH 0.135, LDH 178, hgb 12.5, K 3.4, creatinine 1.07 ? ? ?Past Medical History:  ?Diagnosis Date  ? "   ? Arthritis   ? CAD (coronary artery disease)   ? a. s/p CABG in 11/2019 with LIMA-LAD, SVG-OM1, SVG-PDA and SVG-D1  ? CHF (congestive heart failure) ([place])   ? a. EF < 20% by echo in 11/2019  ? Essential hypertension   ? PAF (paroxysmal atrial fibrillation) ([place])   ? Type 2 diabetes mellitus ([place])   ? ? ?Current Outpatient  Medications  ?Medication Sig Dispense Refill  ? albuterol (PROVENTIL) (2.5 MG/3ML) 0.083% nebulizer solution INHALE 3 ML BY NEBULIZATION EVERY 6 HOURS AS NEEDED FOR WHEEZING OR SHORTNESS OF BREATH 450 mL 1  ? allopurinol (ZYLOPRIM) 100 MG tablet Take 1 tablet (100 mg total) by mouth daily. 90 tablet 3  ? blood glucose meter kit and supplies KIT Dispense based on patient and insurance preference. Use to check CBG's three times a day. (FOR ICD-9 250.00, 250.01). 1 each 0  ? colchicine 0.6 MG tablet TAKE 1 TABLET (0.6 MG TOTAL) BY MOUTH AS NEEDED. 180 tablet 1  ? Fluticasone-Umeclidin-Vilant (TRELEGY ELLIPTA) 100-62.5-25 MCG/INH AEPB Inhale 1 puff into the lungs daily. 60 each 6  ? furosemide (LASIX) 40 MG tablet TAKE 0.5 TABLETS (20 MG TOTAL) BY MOUTH DAILY AS NEEDED. 45 tablet 3  ? gabapentin (NEURONTIN) 600 MG tablet Take 600 mg by mouth 2 (two) times daily.    ?  guaiFENesin ([place]) 600 MG 12 hr tablet Take 1 tablet (600 mg total) by mouth 2 (two) times daily. 60 tablet 6  ? loratadine (CLARITIN) 10 MG tablet Take 1 tablet (10 mg total) by mouth daily. 30 tablet 6  ? metFORMIN (GLUCOPHAGE) 1000 MG tablet TAKE 1 TABLET (1,000 MG TOTAL) BY MOUTH IN THE MORNING AND AT BEDTIME. 180 tablet 3  ? methimazole (TAPAZOLE) 10 MG tablet Take 2 tablets (20 mg total) by mouth 2 (two) times daily. 120 tablet 6  ? metoprolol succinate (TOPROL XL) 25 MG 24 hr tablet Take 1 tablet (25 mg total) by mouth daily. 30 tablet 6  ? Multiple Vitamin (MULTIVITAMIN WITH MINERALS) TABS tablet Take 1 tablet by mouth daily.    ? OLANZapine (ZYPREXA) 5 MG tablet Take 5 mg by mouth at bedtime.    ? pantoprazole (PROTONIX) 40 MG tablet TAKE 1 TABLET BY MOUTH EVERY DAY 90 tablet 2  ? sertraline (ZOLOFT) 50 MG tablet Take 1 tablet (50 mg total) by mouth daily. 90 tablet 3  ? Tetrahydrozoline HCl (REDNESS RELIEVER EYE DROPS OP) Apply 2 drops to eye daily as needed.    ? warfarin (COUMADIN) 4 MG tablet On 10/5 take 4 mg then resume 6 mg (1.5 tabs) on  Mon/Wed/Fri and 4 mg (1 tab) all other days or as directed by HF Clinic 60 tablet 11  ? cyclobenzaprine (FLEXERIL) 5 MG tablet Take 1-2 tablets (5-10 mg total) by mouth 2 (two) times daily as needed for muscle spasms. (

## 2022-03-18 ENCOUNTER — Ambulatory Visit (HOSPITAL_COMMUNITY): Payer: Self-pay | Admitting: Pharmacist

## 2022-03-18 LAB — POCT INR: INR: 3.8 — AB (ref 2.0–3.0)

## 2022-03-18 NOTE — Progress Notes (Signed)
LVAD INR 

## 2022-03-26 ENCOUNTER — Ambulatory Visit (HOSPITAL_COMMUNITY): Payer: Self-pay | Admitting: Pharmacist

## 2022-03-26 ENCOUNTER — Ambulatory Visit (HOSPITAL_COMMUNITY)
Admission: RE | Admit: 2022-03-26 | Discharge: 2022-03-26 | Disposition: A | Discharge status: Home / Self Care | Payer: 59 | Source: Ambulatory Visit | Attending: Cardiology | Admitting: Cardiology

## 2022-03-26 DIAGNOSIS — Z95811 Presence of heart assist device: Secondary | ICD-10-CM | POA: Diagnosis not present

## 2022-03-26 DIAGNOSIS — Z452 Encounter for adjustment and management of vascular access device: Secondary | ICD-10-CM | POA: Diagnosis present

## 2022-03-26 LAB — POCT INR: INR: 2.1 (ref 2.0–3.0)

## 2022-03-26 NOTE — Progress Notes (Signed)
Pt called the office this morning with alarms for backup battery fault. Pt presents to clinic for evaluation of alarms. ? ?On exam, pt does have backup battery fault. Pt also has splice in the white lead of his controller with wires exposed, modular cable looks good. Opted to go ahead and change out pts controller. Pt is in agreement. ? ?HeartMate 3 controller exchange performed at the bedside. We discussed that they should never attempt to do this at home without notifying VAD Pager first to triage need and provide support for the patient's safety as they may incur difficulties with the procedure and impair ability to restart pump. I performed elective controller exchange. Patient tolerated controller exchange well and was asymptomatic. Pump restarted as expected with stable VAD parameters. ? ?New controller [id number] w/BB [id number] Expiring in 31 mths ? ?[name] RN, BSN ?VAD Coordinator ?24/7 Pager [phone] ? ? ?

## 2022-03-26 NOTE — Progress Notes (Signed)
LVAD INR 

## 2022-04-01 ENCOUNTER — Ambulatory Visit (HOSPITAL_COMMUNITY): Payer: Self-pay | Admitting: Pharmacist

## 2022-04-01 LAB — POCT INR: INR: 1.2 — AB (ref 2.0–3.0)

## 2022-04-01 MED ORDER — ENOXAPARIN SODIUM 40 MG/0.4ML IJ SOSY: 40.0000 mg | PREFILLED_SYRINGE | Freq: Two times a day (BID) | INTRAMUSCULAR | 0 refills | Status: DC

## 2022-04-01 NOTE — Progress Notes (Signed)
LVAD INR 

## 2022-04-10 ENCOUNTER — Ambulatory Visit (HOSPITAL_COMMUNITY): Payer: Self-pay | Admitting: Pharmacist

## 2022-04-10 LAB — POCT INR: INR: 3.2 — AB (ref 2.0–3.0)

## 2022-04-10 NOTE — Progress Notes (Signed)
LVAD INR 

## 2022-04-25 ENCOUNTER — Ambulatory Visit (HOSPITAL_COMMUNITY): Payer: Self-pay | Admitting: Pharmacist

## 2022-04-25 LAB — POCT INR: INR: 1 — AB (ref 2.0–3.0)

## 2022-04-25 MED ORDER — ENOXAPARIN SODIUM 40 MG/0.4ML IJ SOSY: 40.0000 mg | PREFILLED_SYRINGE | Freq: Two times a day (BID) | INTRAMUSCULAR | 0 refills | Status: DC

## 2022-04-25 NOTE — Progress Notes (Signed)
LVAD INR 

## 2022-04-30 ENCOUNTER — Ambulatory Visit (HOSPITAL_COMMUNITY): Payer: Self-pay | Admitting: Pharmacist

## 2022-04-30 LAB — POCT INR: INR: 2.2 (ref 2.0–3.0)

## 2022-04-30 NOTE — Progress Notes (Signed)
LVAD INR 

## 2022-05-07 ENCOUNTER — Ambulatory Visit (HOSPITAL_COMMUNITY): Payer: Self-pay | Admitting: Pharmacist

## 2022-05-07 LAB — POCT INR: INR: 2.9 (ref 2.0–3.0)

## 2022-05-07 NOTE — Progress Notes (Signed)
LVAD INR 

## 2022-05-09 ENCOUNTER — Other Ambulatory Visit (HOSPITAL_COMMUNITY): Payer: Self-pay | Admitting: Unknown Physician Specialty

## 2022-05-09 DIAGNOSIS — Z7901 Long term (current) use of anticoagulants: Secondary | ICD-10-CM

## 2022-05-09 DIAGNOSIS — Z95811 Presence of heart assist device: Secondary | ICD-10-CM

## 2022-05-13 ENCOUNTER — Inpatient Hospital Stay (HOSPITAL_COMMUNITY): Payer: 59

## 2022-05-13 ENCOUNTER — Encounter (HOSPITAL_COMMUNITY): Payer: 59

## 2022-05-13 ENCOUNTER — Encounter (HOSPITAL_COMMUNITY): Payer: Self-pay | Admitting: Cardiology

## 2022-05-13 ENCOUNTER — Telehealth (HOSPITAL_COMMUNITY): Payer: Self-pay | Admitting: *Deleted

## 2022-05-13 ENCOUNTER — Inpatient Hospital Stay (HOSPITAL_COMMUNITY)
Admission: RE | Admit: 2022-05-13 | Discharge: 2022-06-03 | DRG: 981 | Disposition: A | Discharge status: Home Health | Payer: 59 | Source: Other Acute Inpatient Hospital | Attending: Cardiology | Admitting: Cardiology

## 2022-05-13 DIAGNOSIS — I5022 Chronic systolic (congestive) heart failure: Secondary | ICD-10-CM | POA: Diagnosis not present

## 2022-05-13 DIAGNOSIS — Z951 Presence of aortocoronary bypass graft: Secondary | ICD-10-CM | POA: Diagnosis not present

## 2022-05-13 DIAGNOSIS — Z8249 Family history of ischemic heart disease and other diseases of the circulatory system: Secondary | ICD-10-CM

## 2022-05-13 DIAGNOSIS — I25119 Atherosclerotic heart disease of native coronary artery with unspecified angina pectoris: Secondary | ICD-10-CM | POA: Diagnosis not present

## 2022-05-13 DIAGNOSIS — Z7984 Long term (current) use of oral hypoglycemic drugs: Secondary | ICD-10-CM | POA: Diagnosis not present

## 2022-05-13 DIAGNOSIS — N179 Acute kidney failure, unspecified: Secondary | ICD-10-CM | POA: Diagnosis not present

## 2022-05-13 DIAGNOSIS — F1590 Other stimulant use, unspecified, uncomplicated: Secondary | ICD-10-CM | POA: Diagnosis present

## 2022-05-13 DIAGNOSIS — Z825 Family history of asthma and other chronic lower respiratory diseases: Secondary | ICD-10-CM

## 2022-05-13 DIAGNOSIS — E440 Moderate protein-calorie malnutrition: Secondary | ICD-10-CM | POA: Diagnosis present

## 2022-05-13 DIAGNOSIS — J449 Chronic obstructive pulmonary disease, unspecified: Secondary | ICD-10-CM | POA: Diagnosis present

## 2022-05-13 DIAGNOSIS — Z833 Family history of diabetes mellitus: Secondary | ICD-10-CM

## 2022-05-13 DIAGNOSIS — T829XXD Unspecified complication of cardiac and vascular prosthetic device, implant and graft, subsequent encounter: Secondary | ICD-10-CM | POA: Diagnosis not present

## 2022-05-13 DIAGNOSIS — I480 Paroxysmal atrial fibrillation: Secondary | ICD-10-CM | POA: Diagnosis present

## 2022-05-13 DIAGNOSIS — T829XXA Unspecified complication of cardiac and vascular prosthetic device, implant and graft, initial encounter: Secondary | ICD-10-CM

## 2022-05-13 DIAGNOSIS — M109 Gout, unspecified: Secondary | ICD-10-CM | POA: Diagnosis present

## 2022-05-13 DIAGNOSIS — I701 Atherosclerosis of renal artery: Secondary | ICD-10-CM | POA: Diagnosis present

## 2022-05-13 DIAGNOSIS — I5023 Acute on chronic systolic (congestive) heart failure: Secondary | ICD-10-CM | POA: Diagnosis present

## 2022-05-13 DIAGNOSIS — A4102 Sepsis due to Methicillin resistant Staphylococcus aureus: Secondary | ICD-10-CM | POA: Diagnosis present

## 2022-05-13 DIAGNOSIS — M25532 Pain in left wrist: Secondary | ICD-10-CM | POA: Diagnosis not present

## 2022-05-13 DIAGNOSIS — I110 Hypertensive heart disease with heart failure: Secondary | ICD-10-CM | POA: Diagnosis present

## 2022-05-13 DIAGNOSIS — E0590 Thyrotoxicosis, unspecified without thyrotoxic crisis or storm: Secondary | ICD-10-CM | POA: Diagnosis present

## 2022-05-13 DIAGNOSIS — I493 Ventricular premature depolarization: Secondary | ICD-10-CM | POA: Diagnosis present

## 2022-05-13 DIAGNOSIS — A4902 Methicillin resistant Staphylococcus aureus infection, unspecified site: Secondary | ICD-10-CM | POA: Diagnosis not present

## 2022-05-13 DIAGNOSIS — A419 Sepsis, unspecified organism: Secondary | ICD-10-CM | POA: Diagnosis not present

## 2022-05-13 DIAGNOSIS — I255 Ischemic cardiomyopathy: Secondary | ICD-10-CM | POA: Diagnosis present

## 2022-05-13 DIAGNOSIS — K5900 Constipation, unspecified: Secondary | ICD-10-CM | POA: Diagnosis not present

## 2022-05-13 DIAGNOSIS — Z87891 Personal history of nicotine dependence: Secondary | ICD-10-CM

## 2022-05-13 DIAGNOSIS — T827XXA Infection and inflammatory reaction due to other cardiac and vascular devices, implants and grafts, initial encounter: Principal | ICD-10-CM | POA: Diagnosis present

## 2022-05-13 DIAGNOSIS — I9789 Other postprocedural complications and disorders of the circulatory system, not elsewhere classified: Secondary | ICD-10-CM | POA: Diagnosis not present

## 2022-05-13 DIAGNOSIS — Z95811 Presence of heart assist device: Secondary | ICD-10-CM | POA: Diagnosis not present

## 2022-05-13 DIAGNOSIS — R5381 Other malaise: Secondary | ICD-10-CM | POA: Diagnosis present

## 2022-05-13 DIAGNOSIS — E119 Type 2 diabetes mellitus without complications: Secondary | ICD-10-CM | POA: Diagnosis present

## 2022-05-13 DIAGNOSIS — I2510 Atherosclerotic heart disease of native coronary artery without angina pectoris: Secondary | ICD-10-CM | POA: Diagnosis present

## 2022-05-13 DIAGNOSIS — M549 Dorsalgia, unspecified: Secondary | ICD-10-CM | POA: Diagnosis not present

## 2022-05-13 DIAGNOSIS — Z8614 Personal history of Methicillin resistant Staphylococcus aureus infection: Secondary | ICD-10-CM

## 2022-05-13 DIAGNOSIS — B9562 Methicillin resistant Staphylococcus aureus infection as the cause of diseases classified elsewhere: Secondary | ICD-10-CM | POA: Diagnosis not present

## 2022-05-13 DIAGNOSIS — T829XXS Unspecified complication of cardiac and vascular prosthetic device, implant and graft, sequela: Secondary | ICD-10-CM | POA: Diagnosis not present

## 2022-05-13 DIAGNOSIS — G479 Sleep disorder, unspecified: Secondary | ICD-10-CM | POA: Diagnosis not present

## 2022-05-13 DIAGNOSIS — E875 Hyperkalemia: Secondary | ICD-10-CM | POA: Diagnosis not present

## 2022-05-13 DIAGNOSIS — Z79899 Other long term (current) drug therapy: Secondary | ICD-10-CM | POA: Diagnosis not present

## 2022-05-13 DIAGNOSIS — Y831 Surgical operation with implant of artificial internal device as the cause of abnormal reaction of the patient, or of later complication, without mention of misadventure at the time of the procedure: Secondary | ICD-10-CM | POA: Diagnosis present

## 2022-05-13 DIAGNOSIS — D72829 Elevated white blood cell count, unspecified: Secondary | ICD-10-CM | POA: Diagnosis not present

## 2022-05-13 DIAGNOSIS — M542 Cervicalgia: Secondary | ICD-10-CM | POA: Diagnosis not present

## 2022-05-13 DIAGNOSIS — T462X5A Adverse effect of other antidysrhythmic drugs, initial encounter: Secondary | ICD-10-CM | POA: Diagnosis present

## 2022-05-13 DIAGNOSIS — R112 Nausea with vomiting, unspecified: Secondary | ICD-10-CM | POA: Diagnosis not present

## 2022-05-13 DIAGNOSIS — I509 Heart failure, unspecified: Secondary | ICD-10-CM | POA: Diagnosis not present

## 2022-05-13 DIAGNOSIS — R55 Syncope and collapse: Secondary | ICD-10-CM | POA: Diagnosis not present

## 2022-05-13 DIAGNOSIS — J441 Chronic obstructive pulmonary disease with (acute) exacerbation: Secondary | ICD-10-CM | POA: Diagnosis present

## 2022-05-13 DIAGNOSIS — T8579XA Infection and inflammatory reaction due to other internal prosthetic devices, implants and grafts, initial encounter: Secondary | ICD-10-CM | POA: Diagnosis not present

## 2022-05-13 LAB — COMPREHENSIVE METABOLIC PANEL
ALT: 21 U/L (ref 0–44)
AST: 19 U/L (ref 15–41)
Albumin: 3.1 g/dL — ABNORMAL LOW (ref 3.5–5.0)
Alkaline Phosphatase: 111 U/L (ref 38–126)
Anion gap: 13 (ref 5–15)
BUN: 17 mg/dL (ref 8–23)
CO2: 25 mmol/L (ref 22–32)
Calcium: 8.6 mg/dL — ABNORMAL LOW (ref 8.9–10.3)
Chloride: 98 mmol/L (ref 98–111)
Creatinine, Ser: 1.17 mg/dL (ref 0.61–1.24)
GFR, Estimated: >60 mL/min (ref >60)
Glucose, Bld: 163 mg/dL — ABNORMAL HIGH (ref 70–99)
Potassium: 3.5 mmol/L (ref 3.5–5.1)
Sodium: 136 mmol/L (ref 135–145)
Total Bilirubin: 0.7 mg/dL (ref 0.3–1.2)
Total Protein: 7.2 g/dL (ref 6.5–8.1)

## 2022-05-13 LAB — LACTIC ACID, PLASMA: Lactic Acid, Venous: 2.5 mmol/L (ref 0.5–1.9)

## 2022-05-13 LAB — CBC WITH DIFFERENTIAL/PLATELET
Abs Immature Granulocytes: 0.1 10*3/uL — ABNORMAL HIGH (ref 0.00–0.07)
Basophils Absolute: 0.1 10*3/uL (ref 0.0–0.1)
Basophils Relative: 0 %
Eosinophils Absolute: 0 10*3/uL (ref 0.0–0.5)
Eosinophils Relative: 0 %
HCT: 37.5 % — ABNORMAL LOW (ref 39.0–52.0)
Hemoglobin: 12.8 g/dL — ABNORMAL LOW (ref 13.0–17.0)
Immature Granulocytes: 1 %
Lymphocytes Relative: 12 %
Lymphs Abs: 1.8 10*3/uL (ref 0.7–4.0)
MCH: 27 pg (ref 26.0–34.0)
MCHC: 34.1 g/dL (ref 30.0–36.0)
MCV: 79.1 fL — ABNORMAL LOW (ref 80.0–100.0)
Monocytes Absolute: 1.4 10*3/uL — ABNORMAL HIGH (ref 0.1–1.0)
Monocytes Relative: 9 %
Neutro Abs: 12.2 10*3/uL — ABNORMAL HIGH (ref 1.7–7.7)
Neutrophils Relative %: 78 %
Platelets: 205 10*3/uL (ref 150–400)
RBC: 4.74 MIL/uL (ref 4.22–5.81)
RDW: 15.3 % (ref 11.5–15.5)
WBC: 15.6 10*3/uL — ABNORMAL HIGH (ref 4.0–10.5)
nRBC: 0 % (ref 0.0–0.2)

## 2022-05-13 LAB — LACTATE DEHYDROGENASE: LDH: 118 U/L (ref 98–192)

## 2022-05-13 LAB — GLUCOSE, CAPILLARY
Glucose-Capillary: 189 mg/dL — ABNORMAL HIGH (ref 70–99)
Glucose-Capillary: 164 mg/dL — ABNORMAL HIGH (ref 70–99)
Glucose-Capillary: 108 mg/dL — ABNORMAL HIGH (ref 70–99)

## 2022-05-13 LAB — PROTIME-INR
INR: 2.6 — ABNORMAL HIGH (ref 0.8–1.2)
Prothrombin Time: 27.4 seconds — ABNORMAL HIGH (ref 11.4–15.2)

## 2022-05-13 LAB — HEMOGLOBIN A1C
Hgb A1c MFr Bld: 8.2 % — ABNORMAL HIGH (ref 4.8–5.6)
Mean Plasma Glucose: 188.64 mg/dL

## 2022-05-13 LAB — HIV ANTIBODY (ROUTINE TESTING W REFLEX): HIV Screen 4th Generation wRfx: NONREACTIVE

## 2022-05-13 LAB — MAGNESIUM: Magnesium: 1.4 mg/dL — ABNORMAL LOW (ref 1.7–2.4)

## 2022-05-13 LAB — MRSA NEXT GEN BY PCR, NASAL: MRSA by PCR Next Gen: DETECTED — AB

## 2022-05-13 MED ORDER — ONDANSETRON HCL 4 MG/2ML IJ SOLN
4.0000 mg | Freq: Four times a day (QID) | INTRAMUSCULAR | Status: DC | PRN
  Administered 2022-05-17 – 2022-05-21 (×4): 4 mg via INTRAVENOUS
  Filled 2022-05-13 (×5): qty 2

## 2022-05-13 MED ORDER — CHLORHEXIDINE GLUCONATE CLOTH 2 % EX PADS
6.0000 | MEDICATED_PAD | Freq: Every day | CUTANEOUS | Status: DC
Start: 2022-05-13 — End: 2022-06-03
  Administered 2022-05-13 – 2022-06-03 (×22): 6 via TOPICAL

## 2022-05-13 MED ORDER — SERTRALINE HCL 25 MG PO TABS
50.0000 mg | ORAL_TABLET | Freq: Every day | ORAL | Status: DC
  Administered 2022-05-13 – 2022-06-02 (×21): 50 mg via ORAL
  Filled 2022-05-13 (×3): qty 2
  Filled 2022-05-13: qty 1
  Filled 2022-05-13 (×4): qty 2
  Filled 2022-05-13: qty 1
  Filled 2022-05-13 (×5): qty 2
  Filled 2022-05-13: qty 1
  Filled 2022-05-13 (×3): qty 2
  Filled 2022-05-13: qty 1
  Filled 2022-05-13: qty 2
  Filled 2022-05-13: qty 1

## 2022-05-13 MED ORDER — INSULIN ASPART 100 UNIT/ML IJ SOLN
0.0000 [IU] | Freq: Three times a day (TID) | INTRAMUSCULAR | Status: DC
  Administered 2022-05-13 – 2022-05-14 (×5): 4 [IU] via SUBCUTANEOUS
  Administered 2022-05-15: 7 [IU] via SUBCUTANEOUS
  Administered 2022-05-15: 4 [IU] via SUBCUTANEOUS
  Administered 2022-05-16: 11 [IU] via SUBCUTANEOUS
  Administered 2022-05-16: 4 [IU] via SUBCUTANEOUS
  Administered 2022-05-17 (×3): 7 [IU] via SUBCUTANEOUS
  Administered 2022-05-18 (×2): 11 [IU] via SUBCUTANEOUS
  Administered 2022-05-19: 7 [IU] via SUBCUTANEOUS
  Administered 2022-05-19: 4 [IU] via SUBCUTANEOUS
  Administered 2022-05-19: 11 [IU] via SUBCUTANEOUS
  Administered 2022-05-20: 4 [IU] via SUBCUTANEOUS
  Administered 2022-05-20: 7 [IU] via SUBCUTANEOUS
  Administered 2022-05-20: 15 [IU] via SUBCUTANEOUS
  Administered 2022-05-21 (×2): 7 [IU] via SUBCUTANEOUS
  Administered 2022-05-21: 4 [IU] via SUBCUTANEOUS
  Administered 2022-05-22: 7 [IU] via SUBCUTANEOUS
  Administered 2022-05-22: 4 [IU] via SUBCUTANEOUS
  Administered 2022-05-22 – 2022-05-23 (×2): 11 [IU] via SUBCUTANEOUS
  Administered 2022-05-23: 20 [IU] via SUBCUTANEOUS
  Administered 2022-05-24: 4 [IU] via SUBCUTANEOUS
  Administered 2022-05-24: 11 [IU] via SUBCUTANEOUS

## 2022-05-13 MED ORDER — POTASSIUM CHLORIDE CRYS ER 20 MEQ PO TBCR
20.0000 meq | EXTENDED_RELEASE_TABLET | Freq: Once | ORAL | Status: AC
  Administered 2022-05-13: 20 meq via ORAL
  Filled 2022-05-13: qty 1

## 2022-05-13 MED ORDER — VANCOMYCIN HCL 750 MG/150ML IV SOLN
750.0000 mg | Freq: Two times a day (BID) | INTRAVENOUS | Status: DC
  Administered 2022-05-14 – 2022-05-15 (×3): 750 mg via INTRAVENOUS
  Filled 2022-05-13 (×5): qty 150

## 2022-05-13 MED ORDER — NOREPINEPHRINE 4 MG/250ML-% IV SOLN
INTRAVENOUS | Status: AC
  Filled 2022-05-13: qty 250

## 2022-05-13 MED ORDER — OLANZAPINE 5 MG PO TABS: 5.0000 mg | ORAL_TABLET | Freq: Every day | ORAL | Status: DC

## 2022-05-13 MED ORDER — SODIUM CHLORIDE 0.9 % IV SOLN
250.0000 mL | INTRAVENOUS | Status: DC | PRN
  Administered 2022-05-13: 250 mL via INTRAVENOUS
  Administered 2022-05-13: 1000 mL via INTRAVENOUS
  Administered 2022-05-18: 250 mL via INTRAVENOUS

## 2022-05-13 MED ORDER — PANTOPRAZOLE SODIUM 40 MG PO TBEC
40.0000 mg | DELAYED_RELEASE_TABLET | Freq: Every day | ORAL | Status: DC
  Administered 2022-05-14 – 2022-06-03 (×21): 40 mg via ORAL
  Filled 2022-05-13 (×21): qty 1

## 2022-05-13 MED ORDER — SODIUM CHLORIDE 0.9% FLUSH: 3.0000 mL | INTRAVENOUS | Status: DC | PRN

## 2022-05-13 MED ORDER — SODIUM CHLORIDE 0.9% FLUSH
3.0000 mL | Freq: Two times a day (BID) | INTRAVENOUS | Status: DC
  Administered 2022-05-13 – 2022-06-01 (×28): 3 mL via INTRAVENOUS

## 2022-05-13 MED ORDER — TRAZODONE HCL 50 MG PO TABS
150.0000 mg | ORAL_TABLET | Freq: Every day | ORAL | Status: DC
  Administered 2022-05-13 – 2022-06-02 (×21): 150 mg via ORAL
  Filled 2022-05-13 (×22): qty 3

## 2022-05-13 MED ORDER — SODIUM CHLORIDE 0.9 % IV SOLN: INTRAVENOUS | Status: DC

## 2022-05-13 MED ORDER — FENTANYL CITRATE PF 50 MCG/ML IJ SOSY
25.0000 ug | PREFILLED_SYRINGE | INTRAMUSCULAR | Status: DC | PRN
  Administered 2022-05-13 – 2022-05-15 (×15): 25 ug via INTRAVENOUS
  Filled 2022-05-13 (×16): qty 1

## 2022-05-13 MED ORDER — METHIMAZOLE 10 MG PO TABS
20.0000 mg | ORAL_TABLET | Freq: Two times a day (BID) | ORAL | Status: DC
  Administered 2022-05-13 – 2022-06-03 (×42): 20 mg via ORAL
  Filled 2022-05-13 (×43): qty 2

## 2022-05-13 MED ORDER — LORATADINE 10 MG PO TABS
10.0000 mg | ORAL_TABLET | Freq: Every day | ORAL | Status: DC
Start: 2022-05-14 — End: 2022-06-03
  Administered 2022-05-14 – 2022-06-03 (×21): 10 mg via ORAL
  Filled 2022-05-13 (×21): qty 1

## 2022-05-13 MED ORDER — PIPERACILLIN-TAZOBACTAM 3.375 G IVPB
3.3750 g | Freq: Three times a day (TID) | INTRAVENOUS | Status: DC
  Administered 2022-05-13 – 2022-05-17 (×12): 3.375 g via INTRAVENOUS
  Filled 2022-05-13 (×12): qty 50

## 2022-05-13 MED ORDER — FENTANYL CITRATE PF 50 MCG/ML IJ SOSY
25.0000 ug | PREFILLED_SYRINGE | Freq: Once | INTRAMUSCULAR | Status: AC
  Administered 2022-05-13: 25 ug via INTRAVENOUS

## 2022-05-13 MED ORDER — VANCOMYCIN HCL 1500 MG/300ML IV SOLN
1500.0000 mg | Freq: Once | INTRAVENOUS | Status: AC
  Administered 2022-05-13: 1500 mg via INTRAVENOUS
  Filled 2022-05-13: qty 300

## 2022-05-13 MED ORDER — MAGNESIUM SULFATE 4 GM/100ML IV SOLN
4.0000 g | Freq: Once | INTRAVENOUS | Status: AC
  Administered 2022-05-13: 4 g via INTRAVENOUS
  Filled 2022-05-13: qty 100

## 2022-05-13 MED ORDER — ACETAMINOPHEN 325 MG PO TABS
650.0000 mg | ORAL_TABLET | ORAL | Status: DC | PRN
  Administered 2022-05-14 – 2022-06-02 (×7): 650 mg via ORAL
  Filled 2022-05-13 (×7): qty 2

## 2022-05-13 NOTE — Progress Notes (Signed)
Pharmacy Antibiotic Note  Kevin Underwood is a 61 y.o. male with Hs HF and LVAD implant 2021 was admitted on 05/13/2022 with drive line infection. WBC elevated 15, afebrile, Cr 1 crcl 62ml/min. Patient needs broad spectrum GP and pseudomonal and anaerobic coverage. Pharmacy has been consulted for vancomycin and zosyn dosing.  Plan: Zosyn 3.375g IV q8h (4 hour infusion). Vancomycin 1500mg  IV x1 then 750mg  IV q12h     Temp (24hrs), Avg:98.2 F (36.8 C), Min:98.2 F (36.8 C), Max:98.2 F (36.8 C)  Recent Labs  Lab 05/13/22 1305  WBC 15.6*  CREATININE 1.17  LATICACIDVEN 2.5*    CrCl cannot be calculated (Unknown ideal weight.).    No Known Allergies  Antimicrobials this admission: 6/5 vanc > 6/5 zosyn >  Dose adjustments this admission:   Microbiology results:   Pharm.D. CPP, BCPS Clinical Pharmacist [phone] 05/13/2022 2:45 PM

## 2022-05-13 NOTE — Progress Notes (Signed)
Procedure(s) (LRB): VAD DRIVELINE DEBRIDEMENT (N/A) Subjective: Patient examined, CT of chest,abdomen personally reviewed Pt presented with sepsis and purulent drainage from HM-3 driveline exit site assoc with tenderness over the driveline tunnel.  CT shows  mild fluid collection in the lateral abdominal wall No evidence of VAD pocket involvement  Pt now on iv Zosyn,Vanc, wound and blood cultures pending INR 2.6  Objective: Vital signs in last 24 hours: Temp:  [98.2 F (36.8 C)] 98.2 F (36.8 C) (06/05 1600) Pulse Rate:  [77-104] 87 (06/05 1600) Resp:  [0-32] 18 (06/05 1600) BP: (82-123)/(72-103) 114/94 (06/05 1600) SpO2:  [92 %-100 %] 95 % (06/05 1600)  Hemodynamic parameters for last 24 hours:  stable  Intake/Output from previous day: No intake/output data recorded. Intake/Output this shift: Total I/O In: 407.4 [P.O.:240; I.V.:131.9; IV Piggyback:35.5] Out: -   Exam      Exam    General- alert and appropriate    Neck- no JVD, no cervical adenopathy palpable, no carotid bruit   Lungs- clear without rales, wheezes   Cor- regular rate and rhythm, VAD hum present    Abdomen- soft, anterior tenderness over driveline tunnel   Extremities - warm, non-tender, minimal edema   Neuro- oriented, appropriate, no focal weakness   Lab Results: Recent Labs    05/13/22 1305  WBC 15.6*  HGB 12.8*  HCT 37.5*  PLT 205   BMET:  Recent Labs    05/13/22 1305  NA 136  K 3.5  CL 98  CO2 25  GLUCOSE 163*  BUN 17  CREATININE 1.17  CALCIUM 8.6*    PT/INR:  Recent Labs    05/13/22 1305  LABPROT 27.4*  INR 2.6*   ABG    Component Value Date/Time   PHART 7.508 (H) 11/20/2020 0438   HCO3 24.8 09/09/2021 0313   TCO2 29 11/20/2020 0438   ACIDBASEDEF 6.0 (H) 11/24/2019 1718   O2SAT 63.4 09/09/2021 0313   CBG (last 3)  Recent Labs    05/13/22 1347 05/13/22 1630  GLUCAP 189* 164*    Assessment/Plan: S/P Procedure(s) (LRB): VAD DRIVELINE DEBRIDEMENT  (N/A) HM3 powercord tunnel infection Will plan debridement in OR after INR <2 and blood cultures negative, prob mid-week. Plan discussed with patient   LOS: 0 days    Lovett Sox 05/13/2022

## 2022-05-13 NOTE — Progress Notes (Signed)
LVAD Coordinator Rounding Note:  Admitted 05/13/22 to Dr Claris Gladden service due to possible sepsis and drive line infection.   HM III LVAD implanted on 11/17/20 by Dr Orvan Seen under destination therapy criteria.   Pt's wife called VAD clinic this morning reporting pt has been "passing out" multiple times since Sunday and describes what appears to be "seizure" activity with "tongue hanging out and legs jerking. She says he is "so weak he can't walk". Advised her to call 911 and have patient transported to [facility] ED. She say "he won't let me call 911". Asked to talk with patient who answered weakly. Informed patient Dr. Aundra Dubin wants him to come to ED now. Patient told wife he would come by car, "not EMS". Patient got out of bed and walked few steps to bathroom, grabbed/leaned on sink, and per [name], "went down". EMS called and that time and pt was transported to [facility] ED. Brief working up including labs, dose of IV Zosyn, and head CT due to multiple falls completed there and then pt was transported to [place] at [facility].   Pt laying in bed upon my arrival. Reports significant pain in his legs and feet for the last few days. Reports drive line has been "fine" until Saturday when he "leaned over something" and then noticed drive line drainage. Per pt's wife Linus Orn she has changed his dressing 3 times since Saturday evening due to drainage. When I asked why they did not page VAD coordinator over the weekend when drainage and syncope occurred Linus Orn said pt did not want [place] to be called. Confirmed Linus Orn has VAD pager number in her phone.   Pt reports he is no longer wearing his VAD vest because he "does not like the wires everywhere. Per both pt and Linus Orn pt is taping equipment to him. Batteries and controller covered in tape residue and pieces of blue painters tape. Linus Orn reports that pt is not careful with his equipment, and at times lets equipment dangle at his side.   Drive line dressing  changed. See documentation below. Pt visibly in pain with dressing removal, thrashing in bed, and crying out. Tenderness along drive line tracks up to sternum. Redness noted around exit site. Unable to express drainage from site, but culture was obtained during dressing change. Plan for CT chest/abd/pelvis to assess site further. Plan for Vancomycin and Zosyn while drive line and blood cultures are pending.   Vital signs: Temp: 98.2 HR: 80 Doppler Pressure: 98 Automatic BP: 107/91 (98) O2 Sat: 100% on RA Wt:  lbs     LVAD interrogation reveals:  Speed: 5600 Flow: 4.2 Power: 4.2 w PI: 3.5  Hct: 41   Alarms: none Events: 100+ today  Fixed speed: 5600 Low speed limit: 5300   Drive Line: Existing VAD dressing removed and site care performed using sterile technique. Drive line exit site cleaned with Chlora prep applicators x 2, allowed to dry, and gauze dressing with silver strip applied. Exit site healed and incorporated, the velour is fully implanted at exit site. Redness, tenderness tracking along drive line up to sternum, and moderate amount of brown/bloody thick drainage noted. No foul odor or rash. Unable to express drainage, but drive line culture obtained. Drive line anchor re-applied. Pt denies fever or chills. Will need daily drive line dressing changes using daily kit and silver strip by bedside RN or pt's wife. Next dressing change due 05/14/22.      Labs:  LDH trend:   INR trend:  WBC: 17.8 (at [facility])>15.6  Infection: 05/13/22>>Blood cultures>> 05/13/22>>Drive line culture>>  Anticoagulation Plan: -INR Goal: 2.0 - 2.5 -ASA Dose: none  Plan/Recommendations:  1. Page VAD coordinator for VAD equipment or drive line issues 2. Daily drive line dressing changes per bedside RN, or patient's wife  Emerson Monte RN [place] Coordinator  Office: [phone]  24/7 Pager: [phone]

## 2022-05-13 NOTE — H&P (Addendum)
Advanced Heart Failure VAD History and Physical Note   PCP-Cardiologist: Rozann Lesches, MD   Reason for Admission: LVAD Complication Drive Infection/Syncope  HPI:    Kevin Underwood is a 61 year old with h/o HFrEF, HMIII LVAD, CAD, S/P CABG 2020 with Maze/LAA clipping, DMII, COPD, HTN, PAF, and previous ETOH abuse.   Patient had echo in 10/21 with EF 20-25%.  He was admitted in 12/21 with cardiogenic shock.  Cath showed patent grafts and low cardiac output.  Suspect mixed ischemic/nonischemic cardiomyopathy, prior heavy ETOH though no ETOH or smoking since 12/20 CABG.  He required stabilization with Impella 5.5 and ultimately had placement of Heartmate 3 LVAD.  Paroxysmal atrial fibrillation noted post-op, but generally stable course and discharged home.     Patient was admitted in 10/22 with COPD exacerbation.  Wilder Glade was stopped with frequent PIs.  He was treated with steroids, nebs, antibiotics with improvement.    Ramp echo was done in 2/23.  At 5600 rpm the LV was small and the septum with some left shift, aortic valve opening 3/5 beats.  At 5500 rpm, the LV was larger and septum more midline, aortic valve still opening 3/5 beats.  The RV was moderately enlarged with normal systolic function. I left speed at 5500 rpm.   He was last seen in the VAD clinic 03/11/22.   Started feeling bad on Friday and felt progressively worse. His wife noticed drainage around driveline on Saturday night. Apparently he does not secure his controller and has uses duck tape and has often dropped his controller. Has had poor appetite.  His wife says he passed out Sunday and earlier today. She says he stood up then sat back down and fell back ok bed then the next time of floor. Last dose of coumadin was 6/4 . She called LVAD coordinators and instructed to call 911. EMS took him to [facility] ED .   [facility]  course- had CT of head that was reported negative.  Labs: lactic acid 2.4, WBC 17, K 3, Hgb  12. CXR RUL opacity. Given dose Zosyn.   Presented to ICU via [place]. Complaining of abdominal pain. Pain 10/10 with any movement.    LVAD INTERROGATION:  HeartMate II LVAD:  Flow 4.4  liters/min, speed 5500, power 4.4, PI 3 .    Review of Systems: [y] = yes, [ ]  = no   General: Weight gain [ ] ; Weight loss [ ] ; Anorexia [ ] ; Fatigue [Y ]; Fever [ ] ; Chills [ Y]; Weakness [Y ]  Cardiac: Chest pain/pressure [ ] ; Resting SOB [ ] ; Exertional SOB [ ] ; Orthopnea [ ] ; Pedal Edema [ ] ; Palpitations [ ] ; Syncope [ ] ; Presyncope [ ] ; Paroxysmal nocturnal dyspnea[ ]   Pulmonary: Cough [ ] ; Wheezing[ ] ; Hemoptysis[ ] ; Sputum [ ] ; Snoring [ ]   GI: Vomiting[ ] ; Dysphagia[ ] ; Melena[ ] ; Hematochezia [ ] ; Heartburn[ ] ; Abdominal pain [ ] ; Constipation [ ] ; Diarrhea [ ] ; BRBPR [ ]   GU: Hematuria[ ] ; Dysuria [ ] ; Nocturia[ ]   Vascular: Pain in legs with walking [ ] ; Pain in feet with lying flat [ ] ; Non-healing sores [ ] ; Stroke [ ] ; TIA [ ] ; Slurred speech [ ] ;  Neuro: Headaches[ ] ; Vertigo[ ] ; Seizures[ ] ; Paresthesias[ ] ;Blurred vision [ ] ; Diplopia [ ] ; Vision changes [ ]   Ortho/Skin: Arthritis [ ] ; Joint pain [Y ]; Muscle pain [ ] ; Joint swelling [ ] ; Back Pain [ Y]; Rash [ ]   Psych: Depression[ ] ;  Anxiety[ ]   Heme: Bleeding problems [ ] ; Clotting disorders [ ] ; Anemia [ ]   Endocrine: Diabetes [ ] ; Thyroid dysfunction[Y ]    Home Medications Prior to Admission medications   Medication Sig Start Date End Date Taking? Authorizing Provider  albuterol (PROVENTIL) (2.5 MG/3ML) 0.083% nebulizer solution INHALE 3 ML BY NEBULIZATION EVERY 6 HOURS AS NEEDED FOR WHEEZING OR SHORTNESS OF BREATH 12/13/21   Larey Dresser, MD  allopurinol (ZYLOPRIM) 100 MG tablet Take 1 tablet (100 mg total) by mouth daily. 12/04/21   Larey Dresser, MD  blood glucose meter kit and supplies KIT Dispense based on patient and insurance preference. Use to check CBG's three times a day. (FOR ICD-9 250.00, 250.01). 09/25/20    Barton Dubois, MD  colchicine 0.6 MG tablet TAKE 1 TABLET (0.6 MG TOTAL) BY MOUTH AS NEEDED. 12/04/21   Larey Dresser, MD  cyclobenzaprine (FLEXERIL) 5 MG tablet Take 1-2 tablets (5-10 mg total) by mouth 2 (two) times daily as needed for muscle spasms. Patient not taking: Reported on 03/11/2022 01/08/21   Larey Dresser, MD  enoxaparin (LOVENOX) 40 MG/0.4ML injection Inject 0.4 mLs (40 mg total) into the skin every 12 (twelve) hours. 04/25/22   Larey Dresser, MD  Fluticasone-Umeclidin-Vilant (TRELEGY ELLIPTA) 100-62.5-25 MCG/INH AEPB Inhale 1 puff into the lungs daily. 09/11/21   Clegg, Amy D, NP  furosemide (LASIX) 40 MG tablet TAKE 0.5 TABLETS (20 MG TOTAL) BY MOUTH DAILY AS NEEDED. 11/13/21   Larey Dresser, MD  gabapentin (NEURONTIN) 600 MG tablet Take 600 mg by mouth 2 (two) times daily.    [provider]  guaiFENesin (MUCINEX) 600 MG 12 hr tablet Take 1 tablet (600 mg total) by mouth 2 (two) times daily. 09/11/21   Clegg, Amy D, NP  loratadine (CLARITIN) 10 MG tablet Take 1 tablet (10 mg total) by mouth daily. 09/12/21   Clegg, Amy D, NP  metFORMIN (GLUCOPHAGE) 1000 MG tablet TAKE 1 TABLET (1,000 MG TOTAL) BY MOUTH IN THE MORNING AND AT BEDTIME. 06/14/21   Larey Dresser, MD  methimazole (TAPAZOLE) 10 MG tablet Take 2 tablets (20 mg total) by mouth 2 (two) times daily. 01/22/22   Renato Shin, MD  metoprolol succinate (TOPROL XL) 25 MG 24 hr tablet Take 1 tablet (25 mg total) by mouth daily. 01/09/22   Larey Dresser, MD  Multiple Vitamin (MULTIVITAMIN WITH MINERALS) TABS tablet Take 1 tablet by mouth daily. 12/09/19   Gold, Wayne E, PA-C  OLANZapine (ZYPREXA) 5 MG tablet Take 5 mg by mouth at bedtime. 10/25/20   [provider]  pantoprazole (PROTONIX) 40 MG tablet TAKE 1 TABLET BY MOUTH EVERY DAY 11/13/21   Larey Dresser, MD  rosuvastatin (CRESTOR) 20 MG tablet Take 20 mg by mouth daily. Patient not taking: Reported on 03/11/2022    [provider]   Semaglutide (OZEMPIC, 0.25 OR 0.5 MG/DOSE, [place]) Inject 0.5 mg into the skin once a week. Patient not taking: Reported on 03/11/2022    [provider]  sertraline (ZOLOFT) 50 MG tablet Take 1 tablet (50 mg total) by mouth daily. 12/04/21   Larey Dresser, MD  Tetrahydrozoline HCl (REDNESS RELIEVER EYE DROPS OP) Apply 2 drops to eye daily as needed.    [provider]  traZODone (DESYREL) 150 MG tablet Take 1 tablet (150 mg total) by mouth at bedtime. 03/11/22   Larey Dresser, MD  warfarin (COUMADIN) 4 MG tablet On 10/5 take 4 mg then resume  6 mg (1.5 tabs) on Mon/Wed/Fri and 4 mg (1 tab) all other days or as directed by HF Clinic 09/12/21   Darrick Grinder D, NP    Past Medical History: Past Medical History:  Diagnosis Date   "    Arthritis    CAD (coronary artery disease)    a. s/p CABG in 11/2019 with LIMA-LAD, SVG-OM1, SVG-PDA and SVG-D1   CHF (congestive heart failure) ([place])    a. EF < 20% by echo in 11/2019   Essential hypertension    PAF (paroxysmal atrial fibrillation) (HCC)    Type 2 diabetes mellitus ([place])     Past Surgical History: Past Surgical History:  Procedure Laterality Date   BACK SURGERY     CLIPPING OF ATRIAL APPENDAGE N/A 11/26/2019   Procedure: Clipping Of Atrial Appendage using AtriCure 40 Clip;  Surgeon: Wonda Olds, MD;  Location: [place];  Service: Open Heart Surgery;  Laterality: N/A;   CORONARY ARTERY BYPASS GRAFT N/A 11/26/2019   Procedure: CORONARY ARTERY BYPASS GRAFTING (CABG) using endoscopic greater saphenous vein harvest: svc to OM; svc to Diag; svc to PD; and LIMA to LAD.;  Surgeon: Wonda Olds, MD;  Location: [place];  Service: Open Heart Surgery;  Laterality: N/A;   FOOT SURGERY     HAND SURGERY     INSERTION OF IMPLANTABLE LEFT VENTRICULAR ASSIST DEVICE N/A 11/17/2020   Procedure: INSERTION OF IMPLANTABLE LEFT VENTRICULAR ASSIST DEVICE - HM3;  Surgeon: Wonda Olds, MD;  Location: [place];  Service: Open Heart Surgery;   Laterality: N/A;   INTRAOPERATIVE TRANSESOPHAGEAL ECHOCARDIOGRAM  12/04/2019   Procedure: Intraoperative Transesophageal Echocardiogram;  Surgeon: Wonda Olds, MD;  Location: [facility];  Service: Open Heart Surgery;;   MAZE N/A 11/26/2019   Procedure: MAZE using Bilateral Pulmonary Vein isolation.;  Surgeon: Wonda Olds, MD;  Location: [place];  Service: Open Heart Surgery;  Laterality: N/A;   MULTIPLE EXTRACTIONS WITH ALVEOLOPLASTY N/A 11/16/2020   Procedure: MULTIPLE EXTRACTION WITH ALVEOLOPLASTY;  Surgeon: Charlaine Edye Hainline, DMD;  Location: [place];  Service: Dentistry;  Laterality: N/A;   PLACEMENT OF IMPELLA LEFT VENTRICULAR ASSIST DEVICE N/A 11/24/2019   Procedure: PLACEMENT OF IMPELLA 5.5 LEFT VENTRICULAR ASSIST DEVICE;  Surgeon: Wonda Olds, MD;  Location: [place];  Service: Open Heart Surgery;  Laterality: N/A;   PLACEMENT OF IMPELLA LEFT VENTRICULAR ASSIST DEVICE Left 11/10/2020   Procedure: PLACEMENT OF IMPELLA 5.5 LEFT VENTRICULAR ASSIST DEVICE VIA  LEFT AVILLARY ARTERY;  Surgeon: Wonda Olds, MD;  Location: [place];  Service: Open Heart Surgery;  Laterality: Left;   REMOVAL OF IMPELLA LEFT VENTRICULAR ASSIST DEVICE Right 12/04/2019   Procedure: REMOVAL OF IMPELLA LEFT VENTRICULAR ASSIST DEVICE, right axilla;  Surgeon: Wonda Olds, MD;  Location: [place];  Service: Open Heart Surgery;  Laterality: Right;   RIGHT/LEFT HEART CATH AND CORONARY ANGIOGRAPHY N/A 11/24/2019   Procedure: RIGHT/LEFT HEART CATH AND CORONARY ANGIOGRAPHY;  Surgeon: Larey Dresser, MD;  Location: [place] [facility];  Service: Cardiovascular;  Laterality: N/A;   RIGHT/LEFT HEART CATH AND CORONARY/GRAFT ANGIOGRAPHY N/A 11/09/2020   Procedure: RIGHT/LEFT HEART CATH AND CORONARY/GRAFT ANGIOGRAPHY;  Surgeon: Nelva Bush, MD;  Location: [place] [facility];  Service: Cardiovascular;  Laterality: N/A;   TEE WITHOUT CARDIOVERSION N/A 11/24/2019   Procedure: TRANSESOPHAGEAL ECHOCARDIOGRAM (TEE);  Surgeon:  Wonda Olds, MD;  Location: [place];  Service: Open Heart Surgery;  Laterality: N/A;   TEE WITHOUT CARDIOVERSION N/A 11/26/2019  Procedure: TRANSESOPHAGEAL ECHOCARDIOGRAM (TEE);  Surgeon: Wonda Olds, MD;  Location: [place];  Service: Open Heart Surgery;  Laterality: N/A;   TEE WITHOUT CARDIOVERSION N/A 11/10/2020   Procedure: TRANSESOPHAGEAL ECHOCARDIOGRAM (TEE);  Surgeon: Wonda Olds, MD;  Location: [place];  Service: Open Heart Surgery;  Laterality: N/A;   TEE WITHOUT CARDIOVERSION N/A 11/17/2020   Procedure: TRANSESOPHAGEAL ECHOCARDIOGRAM (TEE);  Surgeon: Wonda Olds, MD;  Location: [place];  Service: Open Heart Surgery;  Laterality: N/A;    Family History: Family History  Problem Relation Age of Onset   Heart disease Sister        Tumor?   Arthritis Other    Lung disease Other    Asthma Other    Diabetes Other    Thyroid disease Neg Hx     Social History: Social History   Socioeconomic History   Marital status: Married    Spouse name: Not on file   Number of children: Not on file   Years of education: 12   Highest education level: Not on file  Occupational History   Not on file  Tobacco Use   Smoking status: Former    Packs/day: 1.00    Years: 30.00    Pack years: 30.00    Types: Cigarettes    Quit date: 12/20/2019    Years since quitting: 2.3   Smokeless tobacco: Never  Vaping Use   Vaping Use: Never used  Substance and Sexual Activity   Alcohol use: Yes    Comment: Prior history of excessive intake   Drug use: No   Sexual activity: Not on file  Other Topics Concern   Not on file  Social History Narrative   Not on file   Social Determinants of Health   Financial Resource Strain: Low Risk    Difficulty of Paying Living Expenses: Not very hard  Food Insecurity: No Food Insecurity   Worried About Running Out of Food in the Last Year: Never true   Ran Out of Food in the Last Year: Never true  Transportation Needs: Unmet Transportation Needs    Lack of Transportation (Medical): Yes   Lack of Transportation (Non-Medical): No  Physical Activity: Not on file  Stress: Not on file  Social Connections: Not on file    Allergies:  No Known Allergies  Objective:    Vital Signs:   Temp:  [98.2 F (36.8 C)] 98.2 F (36.8 C) (06/05 1210) Pulse Rate:  [80] 80 (06/05 1210) Resp:  [17] 17 (06/05 1210) BP: (99)/(84) 99/84 (06/05 1210) SpO2:  [98 %] 98 % (06/05 1210)   There were no vitals filed for this visit.  Mean arterial Pressure 70s   Physical Exam    General: Appears weak.  HEENT: Normal Neck: supple. JVP 5-6  . Carotids 2+ bilat; no bruits. No lymphadenopathy or thyromegaly appreciated. Cor: Mechanical heart sounds with LVAD hum present. Lungs: Clear Abdomen: soft, tender, nondistended. No hepatosplenomegaly. No bruits or masses. Good bowel sounds. Driveline: Copious pus under driveline. Erythema on left side of abdomen.  Extremities: no cyanosis, clubbing, rash, edema Neuro: alert & orientedx3, cranial nerves grossly intact. moves all 4 extremities w/o difficulty. Affect pleasant   Telemetry  SR -ST with frequent PVCs.   EKG   N/A  Labs    Basic Metabolic Panel: No results for input(s): NA, K, CL, CO2, GLUCOSE, BUN, CREATININE, CALCIUM, MG, PHOS in the last 168 hours.  Liver Function Tests: No results for input(s): AST, ALT, ALKPHOS,  BILITOT, PROT, ALBUMIN in the last 168 hours. No results for input(s): LIPASE, AMYLASE in the last 168 hours. No results for input(s): AMMONIA in the last 168 hours.  CBC: No results for input(s): WBC, NEUTROABS, HGB, HCT, MCV, PLT in the last 168 hours.  Cardiac Enzymes: No results for input(s): CKTOTAL, CKMB, CKMBINDEX, TROPONINI in the last 168 hours.  BNP: BNP (last 3 results) No results for input(s): BNP in the last 8760 hours.  ProBNP (last 3 results) No results for input(s): PROBNP in the last 8760 hours.   CBG: No results for input(s): GLUCAP in the last  168 hours.  Coagulation Studies: No results for input(s): LABPROT, INR in the last 72 hours.  Other results: EKG:   Imaging    No results found.    Patient Profile:   Kevin Underwood is a 61 year old with h/o HFrEF, HMIII LVAD, CAD, S/P CABG 2020 with Maze/LAA clipping, DMII, COPD, HTN, PAF, and previous ETOH abuse.   Admitted after syncope/driveline infection.   Assessment/Plan:    1. HMIII LVAD Driveline Infection  - On exam his extremely tender, erythematous, and with copious purulent exudate. Maps ok for now. Will need to maintain Map 80.  - WBC at [facility] 17 and given dose of zosyn and IV fluids before transfer. WBC repeated--> 15.6 - Lactic acid 2.5  - Check wound culture.  -Check blood cultures x2. Start vanc and zosyn.  - Check CBC - CXR now.  -Check CT chest/abd/pelvis -Watch for shock. If Maps drop will need to add Norepi.  - hold off on PICC line until blood cxs obtained.  - CT surgery aware of admit.  - Will consult ID>   2. Syncope Syncope x2. No arrhythmias on admit. Suspect related to infection.   3. AKI -Creatinine baseline 1.07--> reported creatinine at [facility] 2.  Given IV fluids. Repeated BMET here. Creainine now down to 1.17.  -Repeat BMET now.   4. HMIII LVAD -HM III LVAD 2021.  -VAD interrogation stable.  - Hold coumadin for now. Last dose of coumadin 05/12/22.  - Check INR-->2.6. If INR < 1.8 will start Heparin drip.  - LDH stable.  - May require surgical debridement.   5. PAF -S/p Maze and LA appendage clip with CABG in 12/20. -In SR. Not on amio due to hyperthyroidism.  - Holding coumadin as noted above.   6. DMII -Add SSI   7. Hyperthyroidism -Check TSH  - Continue methimazole.   8. COPD  9. Hypomagnesium  Mag 1.4 Give 4 grams mag now.   Repeat lab work here. CBC, BMET, INR, LDH, lactic acid, pro calcitonin, and blood cultures now.   I reviewed the LVAD parameters from today, and compared the results to the  patient's prior recorded data.  No programming changes were made.  The LVAD is functioning within specified parameters.  The patient performs LVAD self-test daily.  LVAD interrogation was negative for any significant power changes, alarms or PI events/speed drops.  LVAD equipment check completed and is in good working order.  Back-up equipment present.   LVAD education done on emergency procedures and precautions and reviewed exit site care.  Length of Stay: 0  Darrick Grinder, NP 05/13/2022, 12:18 PM  VAD Team Pager [phone] (7am - 7am) +++VAD ISSUES ONLY+++   Advanced Heart Failure Team Pager [phone] (M-F; [place])  Please contact [facility] for night-coverage after hours (5p -7a ) and weekends on [email or web address] for all non- LVAD Issues  Patient seen with NP, agree with the above note.   Patient is here with sepsis and driveline site infection.  See above history.  He initially presented to [facility] with malaise, lactate elevated and WBCs elevated.  Given Zosyn, transferred to [facility].   CT abdomen/pelvis with small fluid collection along pre-peritoneal driveline. Lactate 2.5, afebrile currently, WBCs 15.6. Creatinine down to 1.17 here after IV fluid.  MAP currently around 90.   General: Well appearing this am. NAD.  HEENT: Normal. Neck: Supple, JVP 7-8 cm. Carotids OK.  Cardiac:  Mechanical heart sounds with LVAD hum present.  Lungs:  CTAB, normal effort.  Abdomen:  NT, ND, no HSM. No bruits or masses. +BS  LVAD exit site: Tenderness along driveline with drainage.  Extremities:  Warm and dry. No cyanosis, clubbing, rash, or edema.  Neuro:  Alert & oriented x 3. Cranial nerves grossly intact. Moves all 4 extremities w/o difficulty. Affect pleasant    1. ID: Suspect driveline infection.  CT abdomen/pelvis with small fluid collection along pre-peritoneal driveline.  Early sepsis.  Lactate has been elevated.  MAP improved with IV fluid, creatinine lower.  Now afebrile, WBCs elevated at 15.6.   - Blood and driveline cultures sent.  - Zosyn/vancomycin.  - ID consult.  - CT surgery has seen, will need site debridement, possibly Wednesday => hold warfarin.   2. Chronic systolic CHF: Echo 54/49 with EF 20-25%, mildly decreased RV function. LHC/RHC in 12/21 with patent grafts, low output. Suspect mixed ischemic/nonischemic cardiomyopathy (prior heavy ETOH and drugs as well as CAD).  No ETOH, drugs, smoking since CABG in 12/20. Admitted with cardiogenic shock in 12/21, had placement of Impella 5.5 initially, now s/p Heartmate 3 LVAD on 11/17/20.  Ramp echo 2/23 with speed decreased to 5500 rpm.  NYHA class II, symptomatically had been doing well.  Not volume overloaded on exam.  - Hold warfarin, cover with warfarin when INR < 1.8.  - He has not needed Lasix.  3. CAD: S/p CABG 12/20.  LHC pre-VAD with patent grafts, no target for intervention. - Continue statin.  3. Atrial fibrillation: Paroxysmal.  S/p Maze and LA appendage clip with CABG in 12/20.  NSR today.  - Off amiodarone with hyperthyroidism. 5. Type 2 diabetes: SSI.  6. Gout:   - Continue allopurinol 100 mg daily.  7. PACs/PVCs  8. COPD: H/o COPD exacerbation, overall feels better on Trelegy.  - Followup with pulmonary.  9. 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  He missed his endocrinology appt.  - He is now off amiodarone.   - Continue methimazole 20 mg bid, check TSH.   - Will need to reschedule endocrine appt.      Loralie Champagne 05/13/2022 4:41 PM

## 2022-05-13 NOTE — Telephone Encounter (Signed)
Wife called [facility] this am to cancel his 10:00 am appointment. She reports he has been "passing out" multiple times since Friday and describes what appears to be "seizure" activity with "tongue hanging out and legs jerking". He is currently in bed and she says he is "so weak he can't walk".   Advised her to call 911 and have patient transported to [facility] ED. She say "he won't let me call 911". Asked to talk with patient who answered weakly. Informed patient Dr. Shirlee Latch wants him to come to ED now. Patient told wife he would come by car, "not EMS". Patient got out of bed and walked few steps to bathroom, grabbed/leaned on sink, and per [name], "went down". She is calling 911 now. Dr. Shirlee Latch updated.  Called [name] to confirm patient is coming to ED. She says EMS is loading now.  Later call from [name] saying EMS called her to say they are taking patient to hospital in [place].  VAD Coordinator called [facility] ED and spoke with Herbert Seta (Consulting civil engineer). She confirmed they did not have VAD equipment available in their ED. Gave her VAD contact information, she will call when patient arrives.   Hessie Diener RN, VAD Coordinator 24/7 VAD pager: [phone]

## 2022-05-13 NOTE — Progress Notes (Signed)
ANTICOAGULATION CONSULT NOTE - Initial Consult  Pharmacy Consult for Warfarin Indication:  LVAD  No Known Allergies  Patient Measurements:     Vital Signs: Temp: 98.2 F (36.8 C) (06/05 1210) Temp Source: Oral (06/05 1210) BP: 82/72 (06/05 1232) Pulse Rate: 80 (06/05 1300)  Labs: Recent Labs    05/13/22 1305  HGB 12.8*  HCT 37.5*  PLT 205  LABPROT 27.4*  INR 2.6*  CREATININE 1.17    CrCl cannot be calculated (Unknown ideal weight.).   Medical History: Past Medical History:  Diagnosis Date   "    Arthritis    CAD (coronary artery disease)    a. s/p CABG in 11/2019 with LIMA-LAD, SVG-OM1, SVG-PDA and SVG-D1   CHF (congestive heart failure) (HCC)    a. EF < 20% by echo in 11/2019   Essential hypertension    PAF (paroxysmal atrial fibrillation) (HCC)    Type 2 diabetes mellitus ([facility])     Assessment: 61yom with LVAD HM3 implanted 2021, admitted for drive line infection.  INR 2.6, no bleeding, h/h stable  Will hold warfarin for now in case needs debridement Will bridge with heparin if needed if INR < 1.8.   Goal of Therapy:  INR 2-2.5 Monitor platelets by anticoagulation protocol: Yes   Plan:  Hold warfarin for now  Daily protime    Bonnita Nasuti Pharm.D. CPP, BCPS Clinical Pharmacist [phone] 05/13/2022 2:37 PM

## 2022-05-13 NOTE — TOC CM/SW Note (Signed)
..   Transition of Care [facility]) Screening Note   Patient Details  Name: RAYSHAD RIVIELLO Date of Birth: 10/29/1961   Transition of Care [facility]) CM/SW Contact:    [name], RN Phone Number: 05/13/2022, 4:38 PM    Transition of Care Department [facility]) has reviewed patient. We will continue to monitor patient advancement through interdisciplinary progression rounds. Will continue to f

## 2022-05-14 DIAGNOSIS — Z95811 Presence of heart assist device: Secondary | ICD-10-CM | POA: Diagnosis not present

## 2022-05-14 DIAGNOSIS — N179 Acute kidney failure, unspecified: Secondary | ICD-10-CM | POA: Diagnosis not present

## 2022-05-14 DIAGNOSIS — T829XXD Unspecified complication of cardiac and vascular prosthetic device, implant and graft, subsequent encounter: Secondary | ICD-10-CM | POA: Diagnosis not present

## 2022-05-14 DIAGNOSIS — T827XXA Infection and inflammatory reaction due to other cardiac and vascular devices, implants and grafts, initial encounter: Secondary | ICD-10-CM | POA: Diagnosis not present

## 2022-05-14 LAB — BASIC METABOLIC PANEL
Anion gap: 14 (ref 5–15)
BUN: 15 mg/dL (ref 8–23)
CO2: 24 mmol/L (ref 22–32)
Calcium: 8.6 mg/dL — ABNORMAL LOW (ref 8.9–10.3)
Chloride: 100 mmol/L (ref 98–111)
Creatinine, Ser: 0.96 mg/dL (ref 0.61–1.24)
GFR, Estimated: >60 mL/min (ref >60)
Glucose, Bld: 130 mg/dL — ABNORMAL HIGH (ref 70–99)
Potassium: 3.2 mmol/L — ABNORMAL LOW (ref 3.5–5.1)
Sodium: 138 mmol/L (ref 135–145)

## 2022-05-14 LAB — PROTIME-INR
INR: 2.9 — ABNORMAL HIGH (ref 0.8–1.2)
Prothrombin Time: 30.1 seconds — ABNORMAL HIGH (ref 11.4–15.2)

## 2022-05-14 LAB — TSH: TSH: 7.79 u[IU]/mL — ABNORMAL HIGH (ref 0.350–4.500)

## 2022-05-14 LAB — CBC
HCT: 36.8 % — ABNORMAL LOW (ref 39.0–52.0)
Hemoglobin: 12.2 g/dL — ABNORMAL LOW (ref 13.0–17.0)
MCH: 26.8 pg (ref 26.0–34.0)
MCHC: 33.2 g/dL (ref 30.0–36.0)
MCV: 80.9 fL (ref 80.0–100.0)
Platelets: 214 10*3/uL (ref 150–400)
RBC: 4.55 MIL/uL (ref 4.22–5.81)
RDW: 15.5 % (ref 11.5–15.5)
WBC: 11.2 10*3/uL — ABNORMAL HIGH (ref 4.0–10.5)
nRBC: 0 % (ref 0.0–0.2)

## 2022-05-14 LAB — GLUCOSE, CAPILLARY
Glucose-Capillary: 157 mg/dL — ABNORMAL HIGH (ref 70–99)
Glucose-Capillary: 169 mg/dL — ABNORMAL HIGH (ref 70–99)
Glucose-Capillary: 165 mg/dL — ABNORMAL HIGH (ref 70–99)
Glucose-Capillary: 150 mg/dL — ABNORMAL HIGH (ref 70–99)

## 2022-05-14 LAB — LACTATE DEHYDROGENASE: LDH: 132 U/L (ref 98–192)

## 2022-05-14 LAB — MAGNESIUM: Magnesium: 2.3 mg/dL (ref 1.7–2.4)

## 2022-05-14 MED ORDER — POTASSIUM CHLORIDE CRYS ER 20 MEQ PO TBCR
40.0000 meq | EXTENDED_RELEASE_TABLET | ORAL | Status: AC
  Administered 2022-05-14: 40 meq via ORAL
  Filled 2022-05-14: qty 2

## 2022-05-14 MED ORDER — PHYTONADIONE 1 MG/0.5 ML ORAL SOLUTION: 1.0000 mg | Freq: Once | ORAL | Status: DC

## 2022-05-14 MED ORDER — OXYCODONE HCL 5 MG PO TABS
5.0000 mg | ORAL_TABLET | ORAL | Status: DC | PRN
  Administered 2022-05-14 – 2022-05-15 (×5): 5 mg via ORAL
  Filled 2022-05-14 (×5): qty 1

## 2022-05-14 MED ORDER — METOPROLOL SUCCINATE ER 25 MG PO TB24: 12.5000 mg | ORAL_TABLET | Freq: Every day | ORAL | Status: DC

## 2022-05-14 MED ORDER — POTASSIUM CHLORIDE CRYS ER 20 MEQ PO TBCR
40.0000 meq | EXTENDED_RELEASE_TABLET | Freq: Once | ORAL | Status: AC
  Administered 2022-05-14: 40 meq via ORAL
  Filled 2022-05-14: qty 2

## 2022-05-14 MED ORDER — MUPIROCIN 2 % EX OINT
1.0000 "application " | TOPICAL_OINTMENT | Freq: Two times a day (BID) | CUTANEOUS | Status: AC
  Administered 2022-05-15 – 2022-05-19 (×10): 1 via NASAL
  Filled 2022-05-14 (×2): qty 22

## 2022-05-14 MED ORDER — METOPROLOL SUCCINATE ER 25 MG PO TB24
25.0000 mg | ORAL_TABLET | Freq: Every day | ORAL | Status: DC
  Administered 2022-05-14 – 2022-05-22 (×9): 25 mg via ORAL
  Filled 2022-05-14 (×9): qty 1

## 2022-05-14 MED ORDER — ALBUTEROL SULFATE (2.5 MG/3ML) 0.083% IN NEBU
2.5000 mg | INHALATION_SOLUTION | Freq: Once | RESPIRATORY_TRACT | Status: AC
Start: 2022-05-14 — End: 2022-05-14
  Administered 2022-05-14: 2.5 mg via RESPIRATORY_TRACT
  Filled 2022-05-14: qty 3

## 2022-05-14 NOTE — Progress Notes (Signed)
LVAD Coordinator Rounding Note:  Admitted 05/13/22 to Dr Claris Gladden service due to possible sepsis and drive line infection.   HM III LVAD implanted on 11/17/20 by Dr Orvan Seen under destination therapy criteria.   Pt's wife called VAD clinic this morning reporting pt has been "passing out" multiple times since Sunday and describes what appears to be "seizure" activity with "tongue hanging out and legs jerking. She says he is "so weak he can't walk". Advised her to call 911 and have patient transported to [facility] ED. She say "he won't let me call 911". Asked to talk with patient who answered weakly. Informed patient Dr. Aundra Dubin wants him to come to ED now. Patient told wife he would come by car, "not EMS". Patient got out of bed and walked few steps to bathroom, grabbed/leaned on sink, and per [name], "went down". EMS called and that time and pt was transported to [facility] ED. Brief working up including labs, dose of IV Zosyn, and head CT due to multiple falls completed there and then pt was transported to [place] [facility].   Pt laying in bed asleep upon my arrival. Arouses easily. Denies complaints other than exit site soreness. Pt's wife is at bedside. Discussed plan for debridement on Thursday, with possible other trips to OR for further debridement if needed, possible wound vac, and possible need for IV antibiotics at discharge. Pt and Linus Orn both verbalized understanding of all the above.   Drive line dressing changed. See documentation below. Pt visibly in pain with dressing removal, but he states site is not as tender as yesterday.   CT obtained yesterday:  1. There is some fluid density along the margin of the preperitoneal segment of the LVAD drive line, suspicious for infection involving this segment of the drive line. No obvious fluid collection is identified along the mediastinal component of the drive line and no obvious pump pocket is observed at this time. Correlate with external  drainage from the drive line in assessing for significance of the small fluid collection along the preperitoneal drive line. 2. Substantial atherosclerosis including the coronary arteries, aortic arch, SMA, and celiac trunk. 3. Aortic Atherosclerosis (ICD10-I70.0) and Emphysema (ICD10-J43.9). 4. 1.8 by 1.3 cm ground-glass density posteriorly in the left upper lobe. This was not readily apparent in the 2021 exam. This may well be alveolitis but low-grade adenocarcinoma is not completely excluded. Initial follow-up with CT at 6 months is recommended to confirm persistence. If persistent, repeat CT is recommended every 2 years until 5 years of stability has been established. This recommendation follows the consensus statement: Guidelines for Management of Incidental Pulmonary Nodules Detected on CT Images: From the Fleischner Society 2017; Radiology 2017; 284:228-243. 5. Nonobstructive left nephrolithiasis. 6.  Prominent stool throughout the colon favors constipation. 7. Airway thickening is present, suggesting bronchitis or reactive airways disease.  Plan for drive line debridement in OR with Dr Prescott Gum on Thursday 05/16/22. INR 2.9 today. Receiving low dose Vitamin K and FFP for goal INR < 2.0.    Vital signs: Temp: 98.2 HR: 79 Doppler Pressure: 88 Automatic BP: 123/94 (104) O2 Sat: 100% on RA Wt: 173.1>  lbs    LVAD interrogation reveals:  Speed: 5600 Flow: 4.2 Power: 4.2 w PI: 2.8 Hct: 37  Alarms: none Events: 13 PI events so far today  Fixed speed: 5600 Low speed limit: 5300   Drive Line: Existing VAD dressing removed and site care performed using sterile technique. Drive line exit site cleaned  with Chlora prep applicators x 2, allowed to dry, and gauze dressing with silver strip applied. Exit site healed and incorporated, the velour is fully implanted at exit site. Redness around exit site, tenderness tracking along drive line up to sternum, and moderate amount of brown/tan thick  drainage with foul odor noted. No rash present. Drive line anchor re-applied. Pt denies fever or chills. Will need daily drive line dressing changes using daily kit and silver strip by bedside RN or pt's wife. Next dressing change due 05/15/22.      Labs:  LDH trend: 132  INR trend: 2.9  WBC: 17.8 (at [facility] [place])>15.6>11.2  Infection: 05/13/22>>Blood cultures>> no growth <24 hrs 05/13/22>>Drive line culture>>rare gram + cocci in pairs & rare gram + rods; final pending  Anticoagulation Plan: -INR Goal: 2.0 - 2.5 -ASA Dose: none  Plan/Recommendations:  1. Page VAD coordinator for VAD equipment or drive line issues 2. Daily drive line dressing changes per bedside RN, or patient's wife  Emerson Monte RN [place] Coordinator  Office: [phone]  24/7 Pager: [phone]

## 2022-05-14 NOTE — Progress Notes (Addendum)
Advanced Heart Failure Rounding Note  PCP-Cardiologist: Rozann Lesches, MD   Subjective:   6/5 Admitted with sepsis/driveline infection. Started on vanc and zosyn . Planning for surgical debridement.   Bld CX pending.  Wound CX gram neg rods/cocci  CT chest/abd/pelvis-->mild fluid collection in the lateral abdominal wall.   Feeling a little better. Complaining of abd tenderness.   LVAD Interrogation HM III: Speed: 5500 Flow: 4.6 PI: 2.7 Power: 4 PI events   Objective:   Weight Range: 78.5 kg Body mass index is 24.83 kg/m.   Vital Signs:   Temp:  [98.1 F (36.7 C)-98.2 F (36.8 C)] 98.1 F (36.7 C) (06/06 0635) Pulse Rate:  [67-143] 83 (06/06 0600) Resp:  [0-32] 16 (06/06 0600) BP: (82-136)/(72-103) 109/97 (06/06 0600) SpO2:  [92 %-100 %] 94 % (06/06 0600) Weight:  [78.5 kg] 78.5 kg (06/06 0500) Last BM Date : 05/13/22  Weight change: Filed Weights   05/14/22 0500  Weight: 78.5 kg    Intake/Output:   Intake/Output Summary (Last 24 hours) at 05/14/2022 0738 Last data filed at 05/14/2022 0600 Gross per 24 hour  Intake 1655.63 ml  Output 850 ml  Net 805.63 ml      Physical Exam   Maps 100s  Physical Exam: GENERAL: No acute distress. HEENT: normal  NECK: Supple, JVP 5-6- .  2+ bilaterally, no bruits.  No lymphadenopathy or thyromegaly appreciated.   CARDIAC:  Mechanical heart sounds with LVAD hum present.  LUNGS:  Clear to auscultation bilaterally.  ABDOMEN:  Soft, round, tender Left quadrant.  positive bowel sounds x4.     LVAD exit site:  Dressing dry and intact.   Stabilization device present and accurately applied.   EXTREMITIES:  Warm and dry, no cyanosis, clubbing, rash or edema  NEUROLOGIC:  Alert and oriented x 3.    No aphasia.  No dysarthria.  Affect pleasant.      Telemetry   SR 80-90s  EKG    N/A   Labs    CBC Recent Labs    05/13/22 1305 05/14/22 0203  WBC 15.6* 11.2*  NEUTROABS 12.2*  --   HGB 12.8* 12.2*  HCT 37.5* 36.8*   MCV 79.1* 80.9  PLT 205 [id number]   Basic Metabolic Panel Recent Labs    05/13/22 1305 05/14/22 0203  NA 136 138  K 3.5 3.2*  CL 98 100  CO2 25 24  GLUCOSE 163* 130*  BUN 17 15  CREATININE 1.17 0.96  CALCIUM 8.6* 8.6*  MG 1.4* 2.3   Liver Function Tests Recent Labs    05/13/22 1305  AST 19  ALT 21  ALKPHOS 111  BILITOT 0.7  PROT 7.2  ALBUMIN 3.1*   No results for input(s): LIPASE, AMYLASE in the last 72 hours. Cardiac Enzymes No results for input(s): CKTOTAL, CKMB, CKMBINDEX, TROPONINI in the last 72 hours.  BNP: BNP (last 3 results) No results for input(s): BNP in the last 8760 hours.  ProBNP (last 3 results) No results for input(s): PROBNP in the last 8760 hours.   D-Dimer No results for input(s): DDIMER in the last 72 hours. Hemoglobin A1C Recent Labs    05/13/22 1309  HGBA1C 8.2*   Fasting Lipid Panel No results for input(s): CHOL, HDL, LDLCALC, TRIG, CHOLHDL, LDLDIRECT in the last 72 hours. Thyroid Function Tests Recent Labs    05/14/22 0203  TSH 7.790*    Other results:   Imaging    DG CHEST PORT 1 VIEW  Result Date: 05/13/2022  CLINICAL DATA:  61 year old male with history LVAD. EXAM: PORTABLE CHEST - 1 VIEW COMPARISON:  Earlier the same day FINDINGS: Similar appearance of indwelling left ventricular assist device. Atherosclerotic calcification aortic arch. Unchanged cardiomediastinal silhouette. Previously visualized subtle opacities in the right upper lobe no longer conspicuous. Mild bibasilar subsegmental atelectasis. No evidence of pleural effusion or pneumothorax. Median sternotomy wires in place. No acute osseous abnormality. Bilateral axillary surgical clips in place. IMPRESSION: 1. Previously visualized hazy opacity in the medial right upper lobe is no longer visualized. 2. LVAD in place with unchanged cardiomediastinal silhouette. Electronically Signed   By: Ruthann Cancer M.D.   On: 05/13/2022 12:46   CT CHEST ABDOMEN PELVIS WO  CONTRAST  Result Date: 05/13/2022 CLINICAL DATA:  Abdominal pain. Possible infection of the left ventricular assist device EXAM: CT CHEST, ABDOMEN AND PELVIS WITHOUT CONTRAST TECHNIQUE: Multidetector CT imaging of the chest, abdomen and pelvis was performed following the standard protocol without IV contrast. RADIATION DOSE REDUCTION: This exam was performed according to the departmental dose-optimization program which includes automated exposure control, adjustment of the mA and/or kV according to patient size and/or use of iterative reconstruction technique. COMPARISON:  Chest radiograph 05/13/2022, CTA chest 11/12/2020 and CT abdomen 11/10/2020 FINDINGS: CT CHEST FINDINGS Cardiovascular: Coronary, aortic arch, and branch vessel atherosclerotic vascular disease. Left atrial appendage occluder. The patient has an LVAD device. There is some abnormal fluid density around the drive line in the preperitoneal segment for example as shown on image 62 series 3. No obvious gas in this collection. The collection does not seem to definitively extend into the mediastinal component of the drive line and no obvious pump pocket is observed at this time. Assessment limited by streak artifact from the metal component of the LVAD. Mediastinum/Nodes: LVAD noted as detailed above. No mediastinal fluid collection or pump pocket. Right paratracheal node borderline prominent at 1.0 cm on image 26 series 3, previously 1.8 cm on 11/10/2020. Lungs/Pleura: Centrilobular emphysema. Benign subpleural lymph node along the right middle lobe side of the minor fissure, no change from 2021. Mild dependent atelectasis in both lower lobes. Airway thickening is present, suggesting bronchitis or reactive airways disease. Faint ground-glass opacity density posteriorly in the left upper lobe measuring 1.8 by 1.3 cm on image 99 of series 7, probably from localized alveolitis, low-grade adenocarcinoma is not completely excluded. This region appeared  unremarkable on the chest CT of 11/12/2020. Musculoskeletal: Prior median sternotomy. Degenerative glenohumeral arthropathy bilaterally. Thoracic spondylosis. CT ABDOMEN PELVIS FINDINGS Hepatobiliary: Unremarkable Pancreas: Unremarkable Spleen: Unremarkable Adrenals/Urinary Tract: 2 mm left kidney upper pole nonobstructive renal calculus. Both adrenal glands unremarkable. No hydronephrosis or hydroureter. Urinary bladder unremarkable. Stomach/Bowel: Prominent stool throughout the colon favors constipation. Vascular/Lymphatic: Atherosclerosis is present, including aortoiliac atherosclerotic disease. Substantial atheromatous plaque proximally in the SMA. There is some plaque proximally in the celiac trunk. Cannot exclude predisposition to chronic mesenteric ischemia based on the appearance. Reproductive: Unremarkable Other: As noted above, there is fluid density along the margin of the preperitoneal portion of the drive line which may be indicative of drive line infection, correlate with any external drainage. Musculoskeletal: Posterolateral rod and pedicle screw fixation at L4-L5-S1 bilaterally. Mild degenerative arthropathy of both hips. IMPRESSION: 1. There is some fluid density along the margin of the preperitoneal segment of the LVAD drive line, suspicious for infection involving this segment of the drive line. No obvious fluid collection is identified along the mediastinal component of the drive line and no obvious pump pocket is observed at this  time. Correlate with external drainage from the drive line in assessing for significance of the small fluid collection along the preperitoneal drive line. 2. Substantial atherosclerosis including the coronary arteries, aortic arch, SMA, and celiac trunk. 3. Aortic Atherosclerosis (ICD10-I70.0) and Emphysema (ICD10-J43.9). 4. 1.8 by 1.3 cm ground-glass density posteriorly in the left upper lobe. This was not readily apparent in the 2021 exam. This may well be alveolitis  but low-grade adenocarcinoma is not completely excluded. Initial follow-up with CT at 6 months is recommended to confirm persistence. If persistent, repeat CT is recommended every 2 years until 5 years of stability has been established. This recommendation follows the consensus statement: Guidelines for Management of Incidental Pulmonary Nodules Detected on CT Images: From the Fleischner Society 2017; Radiology 2017; 284:228-243. 5. Nonobstructive left nephrolithiasis. 6.  Prominent stool throughout the colon favors constipation. 7. Airway thickening is present, suggesting bronchitis or reactive airways disease. Electronically Signed   By: Van Clines M.D.   On: 05/13/2022 14:52     Medications:     Scheduled Medications:  Chlorhexidine Gluconate Cloth  6 each Topical Daily   insulin aspart  0-20 Units Subcutaneous TID WC   loratadine  10 mg Oral Daily   methimazole  20 mg Oral BID   [START ON 05/15/2022] mupirocin ointment  1 application. Nasal BID   pantoprazole  40 mg Oral Daily   sertraline  50 mg Oral QHS   sodium chloride flush  3 mL Intravenous Q12H   traZODone  150 mg Oral QHS    Infusions:  sodium chloride 10 mL/hr at 05/14/22 0600   sodium chloride 50 mL/hr at 05/14/22 0600   piperacillin-tazobactam (ZOSYN)  IV 3.375 g (05/14/22 [id number])   vancomycin 750 mg (05/14/22 0615)    PRN Medications: sodium chloride, acetaminophen, fentaNYL (SUBLIMAZE) injection, ondansetron (ZOFRAN) IV, sodium chloride flush    Patient Profile   Kevin Underwood is a 61 year old with h/o HFrEF, HMIII LVAD, CAD, S/P CABG 2020 with Maze/LAA clipping, DMII, COPD, HTN, PAF, and previous ETOH abuse.    Admitted after syncope/driveline infection.  Assessment/Plan  1. HMIII LVAD Driveline Infection  - On exam his extremely tender, erythematous, and with copious purulent exudate. Maps ok for now. Will need to maintain Map 80.  - WBC at [facility] 17 and given dose of zosyn and IV fluids before  transfer. WBC repeated--> 15.6 - Lactic acid 2.5  - Check wound culture-->Gram +Rods/cocci.  -Check blood cultures x2--> pending.  - Continue vanc and zosyn.  - WBC 15.6>12.2  - Check CT chest/abd/pelvis-->mild fluid collection in the lateral abdominal wall.  -CT surgery consulted. Planning for debridement later this week.    2. Syncope Syncope x2. No arrhythmias on admit. Suspect related to infection.    3. AKI -Creatinine baseline 1.07--> reported creatinine at [facility].  Given IV fluids. Repeated BMET here. Creainine now down to 1.17.  Resolved.    4. HMIII LVAD/ Chronic HFrEF  -HM III LVAD 2021.  - Maps running higher today. Add back 12.5 mg Toprol XL.  - K replaced.  -VAD interrogation stable.  - Hold coumadin for now. Last dose of coumadin 05/12/22.  - INR 2.9. When INR < 1.8 will start Heparin drip.  - LDH stable.  - Pending surgical debridement.     5. PAF -S/p Maze and LA appendage clip with CABG in 12/20. -In SR. Not on amio due to hyperthyroidism.  - Holding coumadin as noted above.  6. DMII -Add SSI    7. Hyperthyroidism -TSH  7.7 - Continue methimazole.    8. COPD   9. Hypomagnesium  Mag 1.4--Given 4 grams mag now.--> Mag 2.3    Length of Stay: 1  Amy Clegg, NP  05/14/2022, 7:38 AM  Advanced Heart Failure Team Pager [phone] (M-F; 7a - 5p)  Please contact [facility] for night-coverage after hours (5p -7a ) and weekends on [email or web address]  Patient seen with NP, agree with the above note.  Feels better today. MAP now around 100.  WBCs down to 11 and afebrile.    General: Well appearing this am. NAD.  HEENT: Normal. Neck: Supple, JVP 7-8 cm. Carotids OK.  Cardiac:  Mechanical heart sounds with LVAD hum present.  Lungs:  CTAB, normal effort.  Abdomen:  NT, ND, no HSM. No bruits or masses. +BS  LVAD exit site: Tenderness and drainage.  Extremities:  Warm and dry. No cyanosis, clubbing, rash, or edema.  Neuro:  Alert & oriented x 3.  Cranial nerves grossly intact. Moves all 4 extremities w/o difficulty. Affect pleasant    Continue vancomycin/Zosyn.  Awaiting culture data.  Needs debridement of driveline site when INR has trended down.  INR 2.9 today. Will give low dose vitamin K.   MAP  now elevated, increase Toprol XL back to home dose 25 mg daily.   TSH now elevated, he is on methimazole.   Mobilize.   Loralie Champagne 05/14/2022 8:02 AM

## 2022-05-14 NOTE — Progress Notes (Signed)
ANTICOAGULATION CONSULT NOTE -  Consult  Pharmacy Consult for Warfarin Indication:  LVAD  No Known Allergies  Patient Measurements: Height: 5\' 10"  (177.8 cm) Weight: 78.5 kg (173 lb 1 oz) IBW/kg (Calculated) : 73   Vital Signs: Temp: 98.1 F (36.7 C) (06/06 0635) Temp Source: Oral (06/06 0635) BP: 109/97 (06/06 0600) Pulse Rate: 83 (06/06 0600)  Labs: Recent Labs    05/13/22 1305 05/14/22 0203  HGB 12.8* 12.2*  HCT 37.5* 36.8*  PLT 205 214  LABPROT 27.4* 30.1*  INR 2.6* 2.9*  CREATININE 1.17 0.96     Estimated Creatinine Clearance: 83.4 mL/min (by C-G formula based on SCr of 0.96 mg/dL).   Medical History: Past Medical History:  Diagnosis Date   "    Arthritis    CAD (coronary artery disease)    a. s/p CABG in 11/2019 with LIMA-LAD, SVG-OM1, SVG-PDA and SVG-D1   CHF (congestive heart failure) (HCC)    a. EF < 20% by echo in 11/2019   Essential hypertension    PAF (paroxysmal atrial fibrillation) (HCC)    Type 2 diabetes mellitus ([place])     Assessment: 61yom with LVAD HM3 implanted 2021, admitted for drive line infection.  INR 2.6 on admit- no bleeding, h/h stable  Will hold warfarin for now planning debridement 6/6 Will bridge with heparin if needed if INR < 1.8. INR bump 2.9 today - no warfarin -   Goal of Therapy:  INR 2-2.5 Monitor platelets by anticoagulation protocol: Yes   Plan:  Hold warfarin for now  Daily protime    Bonnita Nasuti Pharm.D. CPP, BCPS Clinical Pharmacist [phone] 05/14/2022 7:48 AM

## 2022-05-14 NOTE — Progress Notes (Signed)
Procedure(s) (LRB): VAD DRIVELINE DEBRIDEMENT (N/A) Subjective: Abdominal wall tenderness improved INR up tp 2.9 Will use low dose vit k, FFP to get INR< 2.0, for power cord tunnel debridement Thursday am Objective: Vital signs in last 24 hours: Temp:  [98.1 F (36.7 C)-98.2 F (36.8 C)] 98.1 F (36.7 C) (06/06 0635) Pulse Rate:  [67-143] 81 (06/06 1400) Cardiac Rhythm: Normal sinus rhythm (06/06 0800) Resp:  [0-24] 19 (06/06 1400) BP: (92-139)/(73-108) 137/105 (06/06 1400) SpO2:  [92 %-99 %] 98 % (06/06 1400) Weight:  [78.5 kg] 78.5 kg (06/06 0500)  Hemodynamic parameters for last 24 hours:  stable  Intake/Output from previous day: 06/05 0701 - 06/06 0700 In: 1655.6 [P.O.:240; I.V.:922.5; IV Piggyback:493.1] Out: 850 [Urine:850] Intake/Output this shift: Total I/O In: 754.6 [P.O.:480; I.V.:101.3; IV Piggyback:173.3] Out: 475 [Urine:475]  Abd dressing intact Normal VAD hum   Lab Results: Recent Labs    05/13/22 1305 05/14/22 0203  WBC 15.6* 11.2*  HGB 12.8* 12.2*  HCT 37.5* 36.8*  PLT 205 214   BMET:  Recent Labs    05/13/22 1305 05/14/22 0203  NA 136 138  K 3.5 3.2*  CL 98 100  CO2 25 24  GLUCOSE 163* 130*  BUN 17 15  CREATININE 1.17 0.96  CALCIUM 8.6* 8.6*    PT/INR:  Recent Labs    05/14/22 0203  LABPROT 30.1*  INR 2.9*   ABG    Component Value Date/Time   PHART 7.508 (H) 11/20/2020 0438   HCO3 24.8 09/09/2021 0313   TCO2 29 11/20/2020 0438   ACIDBASEDEF 6.0 (H) 11/24/2019 1718   O2SAT 63.4 09/09/2021 0313   CBG (last 3)  Recent Labs    05/13/22 2145 05/14/22 0635 05/14/22 1108  GLUCAP 108* 157* 169*    Assessment/Plan: S/P Procedure(s) (LRB): VAD DRIVELINE DEBRIDEMENT (N/A) Plan abdominal debridement thurs am if INR 2.0 or less   LOS: 1 day    [name] 05/14/2022

## 2022-05-15 DIAGNOSIS — J449 Chronic obstructive pulmonary disease, unspecified: Secondary | ICD-10-CM | POA: Diagnosis not present

## 2022-05-15 DIAGNOSIS — A419 Sepsis, unspecified organism: Secondary | ICD-10-CM

## 2022-05-15 DIAGNOSIS — R55 Syncope and collapse: Secondary | ICD-10-CM | POA: Diagnosis not present

## 2022-05-15 DIAGNOSIS — T827XXA Infection and inflammatory reaction due to other cardiac and vascular devices, implants and grafts, initial encounter: Secondary | ICD-10-CM

## 2022-05-15 DIAGNOSIS — T829XXD Unspecified complication of cardiac and vascular prosthetic device, implant and graft, subsequent encounter: Secondary | ICD-10-CM | POA: Diagnosis not present

## 2022-05-15 DIAGNOSIS — Z95811 Presence of heart assist device: Secondary | ICD-10-CM | POA: Diagnosis not present

## 2022-05-15 DIAGNOSIS — N179 Acute kidney failure, unspecified: Secondary | ICD-10-CM | POA: Diagnosis not present

## 2022-05-15 DIAGNOSIS — D72829 Elevated white blood cell count, unspecified: Secondary | ICD-10-CM

## 2022-05-15 LAB — CBC
HCT: 33.5 % — ABNORMAL LOW (ref 39.0–52.0)
Hemoglobin: 11.3 g/dL — ABNORMAL LOW (ref 13.0–17.0)
MCH: 27.3 pg (ref 26.0–34.0)
MCHC: 33.7 g/dL (ref 30.0–36.0)
MCV: 80.9 fL (ref 80.0–100.0)
Platelets: 207 10*3/uL (ref 150–400)
RBC: 4.14 MIL/uL — ABNORMAL LOW (ref 4.22–5.81)
RDW: 15.5 % (ref 11.5–15.5)
WBC: 10.9 10*3/uL — ABNORMAL HIGH (ref 4.0–10.5)
nRBC: 0 % (ref 0.0–0.2)

## 2022-05-15 LAB — BPAM FFP
Blood Product Expiration Date: 202306112359
Blood Product Expiration Date: 202306112359
ISSUE DATE / TIME: 202306061627
ISSUE DATE / TIME: 202306061627
Unit Type and Rh: 7300
Unit Type and Rh: 7300

## 2022-05-15 LAB — PREPARE FRESH FROZEN PLASMA
Unit division: 0
Unit division: 0

## 2022-05-15 LAB — PROTIME-INR
INR: 1.9 — ABNORMAL HIGH (ref 0.8–1.2)
Prothrombin Time: 21.4 seconds — ABNORMAL HIGH (ref 11.4–15.2)

## 2022-05-15 LAB — GLUCOSE, CAPILLARY
Glucose-Capillary: 158 mg/dL — ABNORMAL HIGH (ref 70–99)
Glucose-Capillary: 199 mg/dL — ABNORMAL HIGH (ref 70–99)
Glucose-Capillary: 227 mg/dL — ABNORMAL HIGH (ref 70–99)
Glucose-Capillary: 114 mg/dL — ABNORMAL HIGH (ref 70–99)
Glucose-Capillary: 157 mg/dL — ABNORMAL HIGH (ref 70–99)

## 2022-05-15 LAB — TYPE AND SCREEN
ABO/RH(D): B POS
Antibody Screen: NEGATIVE

## 2022-05-15 LAB — BASIC METABOLIC PANEL
Anion gap: 9 (ref 5–15)
BUN: 12 mg/dL (ref 8–23)
CO2: 25 mmol/L (ref 22–32)
Calcium: 8.5 mg/dL — ABNORMAL LOW (ref 8.9–10.3)
Chloride: 100 mmol/L (ref 98–111)
Creatinine, Ser: 0.8 mg/dL (ref 0.61–1.24)
GFR, Estimated: >60 mL/min (ref >60)
Glucose, Bld: 144 mg/dL — ABNORMAL HIGH (ref 70–99)
Potassium: 3.6 mmol/L (ref 3.5–5.1)
Sodium: 134 mmol/L — ABNORMAL LOW (ref 135–145)

## 2022-05-15 LAB — LACTATE DEHYDROGENASE: LDH: 116 U/L (ref 98–192)

## 2022-05-15 LAB — AEROBIC CULTURE W GRAM STAIN (SUPERFICIAL SPECIMEN)
Culture: NORMAL
Gram Stain: NONE SEEN
Special Requests: NORMAL

## 2022-05-15 LAB — VANCOMYCIN, TROUGH: Vancomycin Tr: 8 ug/mL — ABNORMAL LOW (ref 15–20)

## 2022-05-15 LAB — HEPARIN LEVEL (UNFRACTIONATED): Heparin Unfractionated: <0.1 IU/mL — ABNORMAL LOW (ref 0.30–0.70)

## 2022-05-15 LAB — MAGNESIUM: Magnesium: 1.7 mg/dL (ref 1.7–2.4)

## 2022-05-15 MED ORDER — HEPARIN (PORCINE) 25000 UT/250ML-% IV SOLN: 500.0000 [IU]/h | INTRAVENOUS | Status: AC

## 2022-05-15 MED ORDER — HEPARIN (PORCINE) 25000 UT/250ML-% IV SOLN
500.0000 [IU]/h | INTRAVENOUS | Status: DC
  Administered 2022-05-15: 500 [IU]/h via INTRAVENOUS
  Filled 2022-05-15: qty 250

## 2022-05-15 MED ORDER — OXYCODONE HCL 5 MG PO TABS
10.0000 mg | ORAL_TABLET | ORAL | Status: DC | PRN
  Administered 2022-05-15 – 2022-05-26 (×22): 10 mg via ORAL
  Filled 2022-05-15 (×24): qty 2

## 2022-05-15 MED ORDER — VANCOMYCIN HCL IN DEXTROSE 1-5 GM/200ML-% IV SOLN
1000.0000 mg | Freq: Two times a day (BID) | INTRAVENOUS | Status: DC
Start: 2022-05-15 — End: 2022-05-22
  Administered 2022-05-15 – 2022-05-22 (×14): 1000 mg via INTRAVENOUS
  Filled 2022-05-15 (×14): qty 200

## 2022-05-15 MED ORDER — SENNOSIDES-DOCUSATE SODIUM 8.6-50 MG PO TABS
2.0000 | ORAL_TABLET | Freq: Every day | ORAL | Status: DC
  Administered 2022-05-15 – 2022-06-01 (×17): 2 via ORAL
  Filled 2022-05-15 (×19): qty 2

## 2022-05-15 MED ORDER — POTASSIUM CHLORIDE CRYS ER 20 MEQ PO TBCR
40.0000 meq | EXTENDED_RELEASE_TABLET | Freq: Once | ORAL | Status: AC
Start: 2022-05-15 — End: 2022-05-15
  Administered 2022-05-15: 40 meq via ORAL
  Filled 2022-05-15: qty 2

## 2022-05-15 MED ORDER — IPRATROPIUM-ALBUTEROL 0.5-2.5 (3) MG/3ML IN SOLN
3.0000 mL | RESPIRATORY_TRACT | Status: DC | PRN
  Administered 2022-05-15 – 2022-05-17 (×2): 3 mL via RESPIRATORY_TRACT
  Filled 2022-05-15 (×2): qty 3

## 2022-05-15 MED ORDER — HYDRALAZINE HCL 20 MG/ML IJ SOLN
10.0000 mg | Freq: Once | INTRAMUSCULAR | Status: AC
  Administered 2022-05-15: 10 mg via INTRAVENOUS
  Filled 2022-05-15: qty 1

## 2022-05-15 MED ORDER — HYDRALAZINE HCL 25 MG PO TABS
25.0000 mg | ORAL_TABLET | Freq: Three times a day (TID) | ORAL | Status: DC
  Administered 2022-05-15 (×2): 25 mg via ORAL
  Filled 2022-05-15 (×2): qty 1

## 2022-05-15 MED ORDER — FENTANYL CITRATE PF 50 MCG/ML IJ SOSY
50.0000 ug | PREFILLED_SYRINGE | INTRAMUSCULAR | Status: DC | PRN
  Administered 2022-05-15 – 2022-05-17 (×4): 50 ug via INTRAVENOUS
  Filled 2022-05-15 (×3): qty 1

## 2022-05-15 MED ORDER — HYDRALAZINE HCL 50 MG PO TABS
50.0000 mg | ORAL_TABLET | Freq: Three times a day (TID) | ORAL | Status: DC
  Administered 2022-05-15 – 2022-05-17 (×4): 50 mg via ORAL
  Filled 2022-05-15 (×4): qty 1

## 2022-05-15 MED ORDER — CEFAZOLIN SODIUM-DEXTROSE 2-4 GM/100ML-% IV SOLN: 2.0000 g | INTRAVENOUS | Status: DC

## 2022-05-15 NOTE — Progress Notes (Signed)
ANTICOAGULATION CONSULT NOTE -  Consult  Pharmacy Consult for Warfarin/heparin Indication:  LVAD  No Known Allergies  Patient Measurements: Height: 5\' 10"  (177.8 cm) Weight: 78.5 kg (173 lb 1 oz) IBW/kg (Calculated) : 73   Vital Signs: Temp: 98.2 F (36.8 C) (06/07 1559) Temp Source: Oral (06/07 1559) BP: 135/100 (06/07 1740) Pulse Rate: 72 (06/07 1400)  Labs: Recent Labs    05/13/22 1305 05/14/22 0203 05/15/22 0211 05/15/22 1650  HGB 12.8* 12.2* 11.3*  --   HCT 37.5* 36.8* 33.5*  --   PLT 205 214 207  --   LABPROT 27.4* 30.1* 21.4*  --   INR 2.6* 2.9* 1.9*  --   HEPARINUNFRC  --   --   --  <0.10*  CREATININE 1.17 0.96 0.80  --      Estimated Creatinine Clearance: 100.1 mL/min (by C-G formula based on SCr of 0.8 mg/dL).   Medical History: Past Medical History:  Diagnosis Date   "    Arthritis    CAD (coronary artery disease)    a. s/p CABG in 11/2019 with LIMA-LAD, SVG-OM1, SVG-PDA and SVG-D1   CHF (congestive heart failure) (HCC)    a. EF < 20% by echo in 11/2019   Essential hypertension    PAF (paroxysmal atrial fibrillation) (HCC)    Type 2 diabetes mellitus ([place])     Assessment: 61yom with LVAD HM3 implanted 2021, admitted for drive line infection.  INR 2.6 on admit- no bleeding, h/h stable  Holding warfarin for now planning debridement 6/8 Will bridge with heparin  INR 1.9  Heparin level <0.1 (on heparin 500 units/hr)    Goal of Therapy:  INR 2-2.5 Heparin level approx 0.3 Monitor platelets by anticoagulation protocol: Yes   Plan:  Heparin drip low dose 500 uts/hr  - no titration today  Follow up heparin level to ensure not > 0.3 Hold warfarin for now  Daily protime, heparin level and cbc   Thank you for allowing pharmacy to be a part of this patient's care.  Donnald Garre, PharmD Clinical Pharmacist  Please check AMION for all [facility] numbers After 10:00 PM, call [place] [phone]

## 2022-05-15 NOTE — Progress Notes (Signed)
Procedure(s) (LRB): VAD DRIVELINE DEBRIDEMENT (N/A) Subjective:  INR 1.9 today OR for DL debridement scheduled in am tomorrow  Objective: Vital signs in last 24 hours: Temp:  [98 F (36.7 C)-98.9 F (37.2 C)] 98.2 F (36.8 C) (06/07 0650) Pulse Rate:  [70-88] 76 (06/07 0600) Cardiac Rhythm: Normal sinus rhythm (06/07 0000) Resp:  [14-26] 15 (06/07 0600) BP: (102-150)/(71-114) 135/93 (06/07 0600) SpO2:  [79 %-99 %] 94 % (06/07 0600)  Hemodynamic parameters for last 24 hours:   stable Intake/Output from previous day: 06/06 0701 - 06/07 0700 In: 3109.4 [P.O.:720; I.V.:1296; Blood:643; IV Piggyback:450.4] Out: 2175 [Urine:2175] Intake/Output this shift: No intake/output data recorded.  EXAM Normal VAD hum DL dressing intact  Lab Results: Recent Labs    05/14/22 0203 05/15/22 0211  WBC 11.2* 10.9*  HGB 12.2* 11.3*  HCT 36.8* 33.5*  PLT 214 207   BMET:  Recent Labs    05/14/22 0203 05/15/22 0211  NA 138 134*  K 3.2* 3.6  CL 100 100  CO2 24 25  GLUCOSE 130* 144*  BUN 15 12  CREATININE 0.96 0.80  CALCIUM 8.6* 8.5*    PT/INR:  Recent Labs    05/15/22 0211  LABPROT 21.4*  INR 1.9*   ABG    Component Value Date/Time   PHART 7.508 (H) 11/20/2020 0438   HCO3 24.8 09/09/2021 0313   TCO2 29 11/20/2020 0438   ACIDBASEDEF 6.0 (H) 11/24/2019 1718   O2SAT 63.4 09/09/2021 0313   CBG (last 3)  Recent Labs    05/14/22 1540 05/14/22 2157 05/15/22 0652  GLUCAP 165* 150* 158*    Assessment/Plan: S/P Procedure(s) (LRB): VAD DRIVELINE DEBRIDEMENT (N/A) OR in am DC heparin on call to OR   LOS: 2 days    [name] 05/15/2022

## 2022-05-15 NOTE — Anesthesia Preprocedure Evaluation (Addendum)
Anesthesia Evaluation  Patient identified by MRN, date of birth, ID band Patient awake    Reviewed: Allergy & Precautions, H&P , NPO status , Patient's Chart, lab work & pertinent test results  History of Anesthesia Complications Negative for: history of anesthetic complications  Airway Mallampati: I  TM Distance: >3 FB Neck ROM: Full   Comment: Pt had teeth extractions yesterday. Dental  (+) Edentulous Upper, Edentulous Lower, Dental Advisory Given   Pulmonary sleep apnea , COPD, former smoker,    breath sounds clear to auscultation       Cardiovascular hypertension, + angina + CAD, + CABG and +CHF  Normal cardiovascular exam+ dysrhythmias Atrial Fibrillation   S/p CABG 11/2019. TTE (11/16/2020): LVEF 20%, RV function mildly reduced. LVAD currently in place.   Neuro/Psych    GI/Hepatic   Endo/Other  diabetes, Type 2  Renal/GU Renal disease     Musculoskeletal   Abdominal   Peds  Hematology  (+) Blood dyscrasia, anemia ,   Anesthesia Other Findings   Reproductive/Obstetrics                            Anesthesia Physical  Anesthesia Plan  ASA: 4  Anesthesia Plan: General   Post-op Pain Management: Celebrex PO (pre-op)* and Tylenol PO (pre-op)*   Induction: Intravenous  PONV Risk Score and Plan: 2 and Ondansetron, Dexamethasone, Midazolam and Treatment may vary due to age or medical condition  Airway Management Planned: Oral ETT  Additional Equipment:   Intra-op Plan:   Post-operative Plan: Extubation in OR  Informed Consent: I have reviewed the patients History and Physical, chart, labs and discussed the procedure including the risks, benefits and alternatives for the proposed anesthesia with the patient or authorized representative who has indicated his/her understanding and acceptance.     Dental advisory given  Plan Discussed with: Anesthesiologist and  Surgeon  Anesthesia Plan Comments:        Anesthesia Quick Evaluation

## 2022-05-15 NOTE — Consult Note (Signed)
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 male with hx of HFrEF, CAD s/p CABG with Maze/LAA clipping, with HM3 LVADin DE 201. He also has hx of COPD. He routinely attends LVAD clinic, no hx of prior driveline infections. He reports feeling poorly on 6/1 but then subsequently having increasing abdominal pain about his driveline with drainage for the next 48hrs. He had syncopal event on 6/4 for which he was taken to closest hospital in [facility]. At [facility], he was found to have leukocytosis of 17k, LA 2.4. His cxr showed RUL infiltrate and subsequently transferred to [facility]. On admit we have WBC of 15.6K, with bandemia. Blood cx on 6/5 ngtd. He has hx of MRSA colonization. He had superficial swab collected that was consistent with skin flora. Imaging of cxr per my read showed infiltrate to RUL. He was continued on piptazo plus vancomycin. Chest ab pelvis CT showed fluid density around fdrive line no gas and dose not extend into the mediastinal component of the drive line.he is not on any pressors.chest CT showing RUL density (? nodule) recommending repeat imaging in the future. Tolerating iv abtx. No fever and chills currently but did have them prior to admission. No sick contacts.  Past Medical History:  Diagnosis Date   "    Arthritis    CAD (coronary artery disease)    a. s/p CABG in 11/2019 with LIMA-LAD, SVG-OM1, SVG-PDA and SVG-D1   CHF (congestive heart failure) (HCC)    a. EF < 20% by echo  in 11/2019   Essential hypertension    PAF (paroxysmal atrial fibrillation) (HCC)    Type 2 diabetes mellitus (HCC)     Allergies: No Known Allergies  MEDICATIONS:  Chlorhexidine Gluconate Cloth  6 each Topical Daily   hydrALAZINE  25 mg Oral Q8H   insulin aspart  0-20 Units Subcutaneous TID WC   loratadine  10 mg Oral Daily   methimazole  20 mg Oral BID   metoprolol succinate  25 mg Oral Daily   mupirocin ointment  1 application. Nasal BID   pantoprazole  40 mg Oral Daily   sertraline  50 mg Oral QHS   sodium chloride flush  3 mL Intravenous Q12H   traZODone  150 mg Oral QHS    Social History   Tobacco Use   Smoking status: Former    Packs/day: 1.00    Years: 30.00    Pack years: 30.00    Types: Cigarettes    Quit date: 12/20/2019    Years since quitting: 2.4   Smokeless tobacco: Never  Vaping Use   Vaping Use: Never used  Substance Use Topics   Alcohol use: Yes    Comment: Prior history of excessive intake   Drug use: No    Family History  Problem Relation Age of Onset   Heart disease Sister        Tumor?   Arthritis Other    Lung disease Other  Asthma Other    Diabetes Other    Thyroid disease Neg Hx      Review of Systems  Constitutional: positive for fever, chills, diaphoresis, activity change, appetite change, fatigue and unexpected weight change.  HENT: Negative for congestion, sore throat, rhinorrhea, sneezing, trouble swallowing and sinus pressure.  Eyes: Negative for photophobia and visual disturbance.  Respiratory: Negative for cough, chest tightness, shortness of breath, wheezing and stridor.  Cardiovascular: Negative for chest pain, palpitations and leg swelling.  Gastrointestinal: positive for abdominal pain nad drainage about driveline exit site. Negative for nausea, vomiting, abdominal pain, diarrhea, constipation, blood in stool, abdominal distention and anal bleeding.  Genitourinary: Negative for dysuria, hematuria, flank pain and  difficulty urinating.  Musculoskeletal: Negative for myalgias, back pain, joint swelling, arthralgias and gait problem.  Skin: Negative for color change, pallor, rash and wound.  Neurological: Negative for dizziness, tremors, weakness and light-headedness.  Hematological: Negative for adenopathy. Does not bruise/bleed easily.  Psychiatric/Behavioral: Negative for behavioral problems, confusion, sleep disturbance, dysphoric mood, decreased concentration and agitation.    OBJECTIVE: Temp:  [98 F (36.7 C)-98.9 F (37.2 C)] 98 F (36.7 C) (06/07 1114) Pulse Rate:  [70-88] 73 (06/07 1200) Resp:  [10-26] 18 (06/07 1200) BP: (102-150)/(71-114) 134/99 (06/07 1200) SpO2:  [79 %-99 %] 95 % (06/07 1200) Physical Exam  Constitutional: He is oriented to person, place, and time. He appears well-developed and well-nourished. No distress.  HENT:  Mouth/Throat: Oropharynx is clear and moist. No oropharyngeal exudate.  Cardiovascular: Normal rate, regular rhythm and normal heart sounds. Exam reveals no gallop and no friction rub.  No murmur heard.  Pulmonary/Chest: Effort normal and breath sounds normal. No respiratory distress. He has no wheezes.  Abdominal: Soft. Bowel sounds are normal. He exhibits no distension. Substernal/infra umbilical tenderness. Driveline dressed Lymphadenopathy:  He has no cervical adenopathy.  Neurological: He is alert and oriented to person, place, and time.  Skin: Skin is warm and dry. No rash noted. No erythema.  Psychiatric: He has a normal mood and affect. His behavior is normal.    LABS: Results for orders placed or performed during the hospital encounter of 05/13/22 (from the past 48 hour(s))  Glucose, capillary     Status: Abnormal   Collection Time: 05/13/22  1:47 PM  Result Value Ref Range   Glucose-Capillary 189 (H) 70 - 99 mg/dL    Comment: Glucose reference range applies only to samples taken after fasting for at least 8 hours.  Glucose, capillary      Status: Abnormal   Collection Time: 05/13/22  4:30 PM  Result Value Ref Range   Glucose-Capillary 164 (H) 70 - 99 mg/dL    Comment: Glucose reference range applies only to samples taken after fasting for at least 8 hours.  Glucose, capillary     Status: Abnormal   Collection Time: 05/13/22  9:45 PM  Result Value Ref Range   Glucose-Capillary 108 (H) 70 - 99 mg/dL    Comment: Glucose reference range applies only to samples taken after fasting for at least 8 hours.  Lactate dehydrogenase     Status: None   Collection Time: 05/14/22  2:03 AM  Result Value Ref Range   LDH 132 98 - 192 U/L    Comment: Performed at [facility], [place] [address], [place], [place] [address]  Protime-INR     Status: Abnormal   Collection Time: 05/14/22  2:03 AM  Result Value Ref Range   Prothrombin Time 30.1 (H) 11.4 -  15.2 seconds   INR 2.9 (H) 0.8 - 1.2    Comment: (NOTE) INR goal varies based on device and disease states. Performed at [facility], [place] [address], [place], [place] [address]   CBC     Status: Abnormal   Collection Time: 05/14/22  2:03 AM  Result Value Ref Range   WBC 11.2 (H) 4.0 - 10.5 K/uL   RBC 4.55 4.22 - 5.81 MIL/uL   Hemoglobin 12.2 (L) 13.0 - 17.0 g/dL   HCT 36.8 (L) 39.0 - 52.0 %   MCV 80.9 80.0 - 100.0 fL   MCH 26.8 26.0 - 34.0 pg   MCHC 33.2 30.0 - 36.0 g/dL   RDW 15.5 11.5 - 15.5 %   Platelets 214 150 - 400 K/uL   nRBC 0.0 0.0 - 0.2 %    Comment: Performed at [facility], [place] [address] [place], [place] [id number]  Basic metabolic panel     Status: Abnormal   Collection Time: 05/14/22  2:03 AM  Result Value Ref Range   Sodium 138 135 - 145 mmol/L   Potassium 3.2 (L) 3.5 - 5.1 mmol/L   Chloride 100 98 - 111 mmol/L   CO2 24 22 - 32 mmol/L   Glucose, Bld 130 (H) 70 - 99 mg/dL    Comment: Glucose reference range applies only to samples taken after fasting for at least 8 hours.   BUN 15 8 - 23 mg/dL   Creatinine, Ser 0.96 0.61 - 1.24 mg/dL   Calcium  8.6 (L) 8.9 - 10.3 mg/dL   GFR, Estimated >60 >60 mL/min    Comment: (NOTE) Calculated using the CKD-EPI Creatinine Equation (2021)    Anion gap 14 5 - 15    Comment: Performed at [facility] [address] [place], [place] [address]  Magnesium     Status: None   Collection Time: 05/14/22  2:03 AM  Result Value Ref Range   Magnesium 2.3 1.7 - 2.4 mg/dL    Comment: Performed at [facility], [place] [address] [place], [place] [address]  TSH     Status: Abnormal   Collection Time: 05/14/22  2:03 AM  Result Value Ref Range   TSH 7.790 (H) 0.350 - 4.500 uIU/mL    Comment: Performed by a 3rd Generation assay with a functional sensitivity of <=0.01 uIU/mL. Performed at [facility], [place] [address] [place], [place] [address]   Glucose, capillary     Status: Abnormal   Collection Time: 05/14/22  6:35 AM  Result Value Ref Range   Glucose-Capillary 157 (H) 70 - 99 mg/dL    Comment: Glucose reference range applies only to samples taken after fasting for at least 8 hours.  Glucose, capillary     Status: Abnormal   Collection Time: 05/14/22 11:08 AM  Result Value Ref Range   Glucose-Capillary 169 (H) 70 - 99 mg/dL    Comment: Glucose reference range applies only to samples taken after fasting for at least 8 hours.  Prepare fresh frozen plasma     Status: None   Collection Time: 05/14/22  3:00 PM  Result Value Ref Range   Unit Number [id number]    Blood Component Type THAWED PLASMA    Unit division 00    Status of Unit ISSUED,FINAL    Transfusion Status      OK TO TRANSFUSE Performed at [facility] [address], [place], [place] [address]  Glucose, capillary     Status: Abnormal   Collection Time: 05/14/22  3:40 PM  Result Value Ref Range   Glucose-Capillary 165 (H) 70 - 99 mg/dL    Comment: Glucose reference range applies only to samples taken after fasting for at least 8 hours.  Prepare fresh frozen plasma     Status: None   Collection Time:  05/14/22  3:47 PM  Result Value Ref Range   Unit Number [id number]    Blood Component Type THAWED PLASMA    Unit division 00    Status of Unit ISSUED,FINAL    Transfusion Status      OK TO TRANSFUSE Performed at [facility] [address], [place], [place] [address]   Glucose, capillary     Status: Abnormal   Collection Time: 05/14/22  9:57 PM  Result Value Ref Range   Glucose-Capillary 150 (H) 70 - 99 mg/dL    Comment: Glucose reference range applies only to samples taken after fasting for at least 8 hours.  Lactate dehydrogenase     Status: None   Collection Time: 05/15/22  2:11 AM  Result Value Ref Range   LDH 116 98 - 192 U/L    Comment: Performed at [facility], [place] [address], [place], [place] [address]  Protime-INR     Status: Abnormal   Collection Time: 05/15/22  2:11 AM  Result Value Ref Range   Prothrombin Time 21.4 (H) 11.4 - 15.2 seconds   INR 1.9 (H) 0.8 - 1.2    Comment: (NOTE) INR goal varies based on device and disease states. Performed at [facility], [place] [address], [place], [place] [address]   CBC     Status: Abnormal   Collection Time: 05/15/22  2:11 AM  Result Value Ref Range   WBC 10.9 (H) 4.0 - 10.5 K/uL   RBC 4.14 (L) 4.22 - 5.81 MIL/uL   Hemoglobin 11.3 (L) 13.0 - 17.0 g/dL   HCT 33.5 (L) 39.0 - 52.0 %   MCV 80.9 80.0 - 100.0 fL   MCH 27.3 26.0 - 34.0 pg   MCHC 33.7 30.0 - 36.0 g/dL   RDW 15.5 11.5 - 15.5 %   Platelets 207 150 - 400 K/uL   nRBC 0.0 0.0 - 0.2 %    Comment: Performed at [facility], [place] [address], [place], [place] [id number]  Basic metabolic panel     Status: Abnormal   Collection Time: 05/15/22  2:11 AM  Result Value Ref Range   Sodium 134 (L) 135 - 145 mmol/L   Potassium 3.6 3.5 - 5.1 mmol/L   Chloride 100 98 - 111 mmol/L   CO2 25 22 - 32 mmol/L   Glucose, Bld 144 (H) 70 - 99 mg/dL    Comment: Glucose reference range applies only to samples taken after fasting for at least 8 hours.   BUN 12  8 - 23 mg/dL   Creatinine, Ser 0.80 0.61 - 1.24 mg/dL   Calcium 8.5 (L) 8.9 - 10.3 mg/dL   GFR, Estimated >60 >60 mL/min    Comment: (NOTE) Calculated using the CKD-EPI Creatinine Equation (2021)    Anion gap 9 5 - 15    Comment: Performed at [facility] [address] [place], [place] [address]  Magnesium     Status: None   Collection Time: 05/15/22  2:11 AM  Result Value Ref Range   Magnesium 1.7 1.7 - 2.4 mg/dL  Comment: Performed at [facility], [place] [address], [place], [place] [address]  Glucose, capillary     Status: Abnormal   Collection Time: 05/15/22  6:52 AM  Result Value Ref Range   Glucose-Capillary 158 (H) 70 - 99 mg/dL    Comment: Glucose reference range applies only to samples taken after fasting for at least 8 hours.  Type and screen [place]     Status: None   Collection Time: 05/15/22  7:57 AM  Result Value Ref Range   ABO/RH(D) B POS    Antibody Screen NEG    Sample Expiration      05/18/2022,2359 Performed at [facility], [place] [address] [place], [place] [address]   Glucose, capillary     Status: Abnormal   Collection Time: 05/15/22  8:28 AM  Result Value Ref Range   Glucose-Capillary 199 (H) 70 - 99 mg/dL    Comment: Glucose reference range applies only to samples taken after fasting for at least 8 hours.  Glucose, capillary     Status: Abnormal   Collection Time: 05/15/22 11:16 AM  Result Value Ref Range   Glucose-Capillary 227 (H) 70 - 99 mg/dL    Comment: Glucose reference range applies only to samples taken after fasting for at least 8 hours.    MICRO: reviewed IMAGING: CT CHEST ABDOMEN PELVIS WO CONTRAST  Result Date: 05/13/2022 CLINICAL DATA:  Abdominal pain. Possible infection of the left ventricular assist device EXAM: CT CHEST, ABDOMEN AND PELVIS WITHOUT CONTRAST TECHNIQUE: Multidetector CT imaging of the chest, abdomen and pelvis was performed following the standard protocol without IV contrast.  RADIATION DOSE REDUCTION: This exam was performed according to the departmental dose-optimization program which includes automated exposure control, adjustment of the mA and/or kV according to patient size and/or use of iterative reconstruction technique. COMPARISON:  Chest radiograph 05/13/2022, CTA chest 11/12/2020 and CT abdomen 11/10/2020 FINDINGS: CT CHEST FINDINGS Cardiovascular: Coronary, aortic arch, and branch vessel atherosclerotic vascular disease. Left atrial appendage occluder. The patient has an LVAD device. There is some abnormal fluid density around the drive line in the preperitoneal segment for example as shown on image 62 series 3. No obvious gas in this collection. The collection does not seem to definitively extend into the mediastinal component of the drive line and no obvious pump pocket is observed at this time. Assessment limited by streak artifact from the metal component of the LVAD. Mediastinum/Nodes: LVAD noted as detailed above. No mediastinal fluid collection or pump pocket. Right paratracheal node borderline prominent at 1.0 cm on image 26 series 3, previously 1.8 cm on 11/10/2020. Lungs/Pleura: Centrilobular emphysema. Benign subpleural lymph node along the right middle lobe side of the minor fissure, no change from 2021. Mild dependent atelectasis in both lower lobes. Airway thickening is present, suggesting bronchitis or reactive airways disease. Faint ground-glass opacity density posteriorly in the left upper lobe measuring 1.8 by 1.3 cm on image 99 of series 7, probably from localized alveolitis, low-grade adenocarcinoma is not completely excluded. This region appeared unremarkable on the chest CT of 11/12/2020. Musculoskeletal: Prior median sternotomy. Degenerative glenohumeral arthropathy bilaterally. Thoracic spondylosis. CT ABDOMEN PELVIS FINDINGS Hepatobiliary: Unremarkable Pancreas: Unremarkable Spleen: Unremarkable Adrenals/Urinary Tract: 2 mm left kidney upper pole  nonobstructive renal calculus. Both adrenal glands unremarkable. No hydronephrosis or hydroureter. Urinary bladder unremarkable. Stomach/Bowel: Prominent stool throughout the colon favors constipation. Vascular/Lymphatic: Atherosclerosis is present, including aortoiliac atherosclerotic disease. Substantial atheromatous plaque proximally in the SMA. There is some plaque  proximally in the celiac trunk. Cannot exclude predisposition to chronic mesenteric ischemia based on the appearance. Reproductive: Unremarkable Other: As noted above, there is fluid density along the margin of the preperitoneal portion of the drive line which may be indicative of drive line infection, correlate with any external drainage. Musculoskeletal: Posterolateral rod and pedicle screw fixation at L4-L5-S1 bilaterally. Mild degenerative arthropathy of both hips. IMPRESSION: 1. There is some fluid density along the margin of the preperitoneal segment of the LVAD drive line, suspicious for infection involving this segment of the drive line. No obvious fluid collection is identified along the mediastinal component of the drive line and no obvious pump pocket is observed at this time. Correlate with external drainage from the drive line in assessing for significance of the small fluid collection along the preperitoneal drive line. 2. Substantial atherosclerosis including the coronary arteries, aortic arch, SMA, and celiac trunk. 3. Aortic Atherosclerosis (ICD10-I70.0) and Emphysema (ICD10-J43.9). 4. 1.8 by 1.3 cm ground-glass density posteriorly in the left upper lobe. This was not readily apparent in the 2021 exam. This may well be alveolitis but low-grade adenocarcinoma is not completely excluded. Initial follow-up with CT at 6 months is recommended to confirm persistence. If persistent, repeat CT is recommended every 2 years until 5 years of stability has been established. This recommendation follows the consensus statement: Guidelines for  Management of Incidental Pulmonary Nodules Detected on CT Images: From the Fleischner Society 2017; Radiology 2017; 284:228-243. 5. Nonobstructive left nephrolithiasis. 6.  Prominent stool throughout the colon favors constipation. 7. Airway thickening is present, suggesting bronchitis or reactive airways disease. Electronically Signed   By: Van Clines M.D.   On: 05/13/2022 14:52     Assessment/Plan:  61yo M with HM3LVAD with acute onset of sepsis and driveline drainage c/w DLI. Agree for need of surgical debridement.  - continue on vancomycin and piptazo for now. Please get deep tissue cultures from debridement tomorrow. Would like to narrow abtx  Questionable pneumonia = imaging suggests LUL densitiy to follow up with pulm nodule guidelines  COPD = continue with trelegy  Leukocytosis = trending downward due to abtx/treatment of infection  Syncope in the setting of sepsis/driveline infection = continue to monitor, no further events noted.

## 2022-05-15 NOTE — Progress Notes (Addendum)
LVAD Coordinator Rounding Note:  Admitted 05/13/22 to Dr Alford Highland service due to possible sepsis and drive line infection.   HM III LVAD implanted on 11/17/20 by Dr Vickey Sages under destination therapy criteria.   Pt's wife called VAD clinic this morning reporting pt has been "passing out" multiple times since Sunday and describes what appears to be "seizure" activity with "tongue hanging out and legs jerking. She says he is "so weak he can't walk". Advised her to call 911 and have patient transported to [facility] ED. She say "he won't let me call 911". Asked to talk with patient who answered weakly. Informed patient Dr. Shirlee Latch wants him to come to ED now. Patient told wife he would come by car, "not EMS". Patient got out of bed and walked few steps to bathroom, grabbed/leaned on sink, and per [name], "went down". EMS called and that time and pt was transported to [facility] ED. Brief working up including labs, dose of IV Zosyn, and head CT due to multiple falls completed there and then pt was transported to 2H at [facility].    Pt laying in bed upon my arrival.  Denies complaints other than exit site and abdominal soreness. Encouraged to get out of bed as tolerated. Pt reports pain is "too bad" and he can not really move without hurting. Receiving PRN Fentanyl and Oxycodone.   Discussed importance of pt wearing his VAD equipment properly in his vest (or clothing item of choice) instead of taping equipment to himself. He states he will not wear his equipment in a vest because he does not like it, and that he does not plan to get out of bed once he gets home because "I can not do the things I want to do" and "I hate all these cords and stuff." When asked to elaborate he states he likes to work outside and complete labor intensive tasks at his mother's house ("which caused this problem"). Made aware he can still do these things, but he has to care for, and wear, his VAD equipment safely and properly,  and he needs to recover from debridement first. States he would rather do nothing at all and "just lay in bed since that is what everyone wants me to do. They just want me under their thumb." Pt currently taking Zoloft 50 mg daily. Pt recently stopped Celexa on his own. Discussed pt with Lasandra Beech CSW and Tonye Becket NP.   Plan for drive line debridement in OR with Dr Donata Clay on Thursday 05/16/22. INR 1.9 today. Received low dose Vitamin K and 2 FFP yesterday. Reminded pt of plan for debridement on Thursday, with possible other trips to OR for further debridement if needed, possible wound vac, and possible need for IV antibiotics at discharge. He verbalized understanding.   Vital signs: Temp: 98.0 HR: 72 Doppler Pressure: 112 Automatic BP: 132/90 (104) O2 Sat: 100% on RA Wt: 173.1>173.0 lbs    LVAD interrogation reveals:  Speed: 5600 Flow: 4.1 Power: 4.1 w PI: 3.6 Hct: 37  Alarms: none Events: 26 PI events so far today  Fixed speed: 5600 Low speed limit: 5300   Drive Line: Existing VAD dressing clean, dry, intact. Anchor correctly applied. Bedside RN to change today. Will need daily drive line dressing changes using daily kit and silver strip by bedside RN or pt's wife. Next dressing change due 05/15/22.    Labs:  LDH trend: 132>116  INR trend: 2.9>1.9  WBC: 17.8 (at [facility])>15.6>11.2>10.9  Infection: 05/13/22>>Blood cultures>> no growth 2 days; final pending 05/13/22>>Drive line culture>>rare gram + cocci in pairs & rare gram + rods; final culture few normal skin flora  Anticoagulation Plan: -INR Goal: 2.0 - 2.5 -ASA Dose: none  Blood Products: 05/14/22>> 2 FFP  Drips: Heparin 500 units/hr  Plan/Recommendations:  1. Page VAD coordinator for VAD equipment or drive line issues 2. Daily drive line dressing changes per bedside RN, or patient's wife 3. VAD coordinator to accompany pt to OR tomorrow for drive line debridement  Emerson Monte RN [place] Coordinator   Office: [phone]  24/7 Pager: [phone]

## 2022-05-15 NOTE — Progress Notes (Signed)
Patient ID: Kevin Underwood, male   DOB: February 01, 1961, 61 y.o.   MRN: [id number]     Advanced Heart Failure Rounding Note  PCP-Cardiologist: Nona Dell, MD   Subjective:    6/5 Admitted with sepsis/driveline infection. Started on vanc and zosyn . Planning for surgical debridement.   Blood/driveline cultures NGTD.   CT chest/abd/pelvis-->mild fluid collection in the lateral abdominal wall.   Still with upper abdominal pain.  MAP running 100s in setting of pain.   LVAD Interrogation HM III: Speed: 5500 Flow: 4.0 PI: 4.2 Power: 4.1.  15 PI events   Objective:   Weight Range: 78.5 kg Body mass index is 24.83 kg/m.   Vital Signs:   Temp:  [98 F (36.7 C)-98.9 F (37.2 C)] 98.2 F (36.8 C) (06/07 0650) Pulse Rate:  [70-88] 76 (06/07 0600) Resp:  [14-26] 15 (06/07 0600) BP: (102-150)/(71-114) 135/93 (06/07 0600) SpO2:  [79 %-99 %] 94 % (06/07 0600) Last BM Date : 05/13/22  Weight change: Filed Weights   05/14/22 0500  Weight: 78.5 kg    Intake/Output:   Intake/Output Summary (Last 24 hours) at 05/15/2022 0813 Last data filed at 05/15/2022 0600 Gross per 24 hour  Intake 2834.81 ml  Output 2175 ml  Net 659.81 ml      Physical Exam  Maps 100s  General: Well appearing this am. NAD.  HEENT: Normal. Neck: Supple, JVP 7-8 cm. Carotids OK.  Cardiac:  Mechanical heart sounds with LVAD hum present.  Lungs:  CTAB, normal effort.  Abdomen:  NT, ND, no HSM. No bruits or masses. +BS  LVAD exit site: Some drainage, also with tenderness along course in upper abdomen.  Extremities:  Warm and dry. No cyanosis, clubbing, rash, or edema.  Neuro:  Alert & oriented x 3. Cranial nerves grossly intact. Moves all 4 extremities w/o difficulty. Affect pleasant    Telemetry   SR 80-90s  EKG    N/A   Labs    CBC Recent Labs    05/13/22 1305 05/14/22 0203 05/15/22 0211  WBC 15.6* 11.2* 10.9*  NEUTROABS 12.2*  --   --   HGB 12.8* 12.2* 11.3*  HCT 37.5* 36.8* 33.5*  MCV  79.1* 80.9 80.9  PLT 205 214 207   Basic Metabolic Panel Recent Labs    73/71/06 0203 05/15/22 0211  NA 138 134*  K 3.2* 3.6  CL 100 100  CO2 24 25  GLUCOSE 130* 144*  BUN 15 12  CREATININE 0.96 0.80  CALCIUM 8.6* 8.5*  MG 2.3 1.7   Liver Function Tests Recent Labs    05/13/22 1305  AST 19  ALT 21  ALKPHOS 111  BILITOT 0.7  PROT 7.2  ALBUMIN 3.1*   No results for input(s): LIPASE, AMYLASE in the last 72 hours. Cardiac Enzymes No results for input(s): CKTOTAL, CKMB, CKMBINDEX, TROPONINI in the last 72 hours.  BNP: BNP (last 3 results) No results for input(s): BNP in the last 8760 hours.  ProBNP (last 3 results) No results for input(s): PROBNP in the last 8760 hours.   D-Dimer No results for input(s): DDIMER in the last 72 hours. Hemoglobin A1C Recent Labs    05/13/22 1309  HGBA1C 8.2*   Fasting Lipid Panel No results for input(s): CHOL, HDL, LDLCALC, TRIG, CHOLHDL, LDLDIRECT in the last 72 hours. Thyroid Function Tests Recent Labs    05/14/22 0203  TSH 7.790*    Other results:   Imaging    No results found.   Medications:  Scheduled Medications:  Chlorhexidine Gluconate Cloth  6 each Topical Daily   hydrALAZINE  25 mg Oral Q8H   insulin aspart  0-20 Units Subcutaneous TID WC   loratadine  10 mg Oral Daily   methimazole  20 mg Oral BID   metoprolol succinate  25 mg Oral Daily   mupirocin ointment  1 application. Nasal BID   pantoprazole  40 mg Oral Daily   sertraline  50 mg Oral QHS   sodium chloride flush  3 mL Intravenous Q12H   traZODone  150 mg Oral QHS    Infusions:  sodium chloride 10 mL/hr at 05/15/22 0600   sodium chloride 50 mL/hr at 05/15/22 0600    ceFAZolin (ANCEF) IV     piperacillin-tazobactam (ZOSYN)  IV 3.375 g (05/15/22 0604)   vancomycin 750 mg (05/15/22 9983)    PRN Medications: sodium chloride, acetaminophen, fentaNYL (SUBLIMAZE) injection, ipratropium-albuterol, ondansetron (ZOFRAN) IV, oxyCODONE,  sodium chloride flush    Patient Profile   Kevin Underwood is a 61 year old with h/o HFrEF, HMIII LVAD, CAD, S/P CABG 2020 with Maze/LAA clipping, DMII, COPD, HTN, PAF, and previous ETOH abuse.    Admitted after syncope/driveline infection.  Assessment/Plan   1. ID: Suspect driveline infection.  CT abdomen/pelvis with small fluid collection along pre-peritoneal driveline.  Early sepsis. Lactate initially elevated.  MAP improved with IV fluid, creatinine lower.  Now afebrile, WBCs coming down at 10.9. Cultures negative so far.  - Zosyn/vancomycin.  - Site debridement by Dr. Donata Clay on Thursday.   2. Chronic systolic CHF: Echo 10/21 with EF 20-25%, mildly decreased RV function. LHC/RHC in 12/21 with patent grafts, low output. Suspect mixed ischemic/nonischemic cardiomyopathy (prior heavy ETOH and drugs as well as CAD).  No ETOH, drugs, smoking since CABG in 12/20. Admitted with cardiogenic shock in 12/21, had placement of Impella 5.5 initially, now s/p Heartmate 3 LVAD on 11/17/20.  Ramp echo 2/23 with speed decreased to 5500 rpm.  NYHA class II, symptomatically had been doing well.  Not volume overloaded on exam. MAP elevated in 100s in setting of pain.  - Holding warfarin, INR 1.9 today and will start heparin gtt.   - Add hydralazine to control BP.  - He has not needed Lasix.  3. CAD: S/p CABG 12/20.  LHC pre-VAD with patent grafts, no target for intervention. - Continue statin.  3. Atrial fibrillation: Paroxysmal.  S/p Maze and LA appendage clip with CABG in 12/20.  NSR today.  - Off amiodarone with hyperthyroidism. 5. Type 2 diabetes: SSI.  6. Gout:   - Continue allopurinol 100 mg daily.  7. PACs/PVCs  8. COPD: H/o COPD exacerbation, overall feels better on Trelegy.  - Followup with pulmonary.  9. 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  He missed his endocrinology appt.  - He is now off amiodarone.   - Continue methimazole 20 mg bid, TSH was mildly elevated.   - Will need to  reschedule endocrine appt.      Length of Stay: 2  Marca Ancona, MD  05/15/2022, 8:13 AM  Advanced Heart Failure Team Pager [phone] (M-F; 7a - 5p)  Please contact [facility] for night-coverage after hours (5p -7a ) and weekends on [email or web address]

## 2022-05-15 NOTE — Progress Notes (Signed)
ANTICOAGULATION CONSULT NOTE -  Consult  Pharmacy Consult for Warfarin/heparin Indication:  LVAD  No Known Allergies  Patient Measurements: Height: 5\' 10"  (177.8 cm) Weight: 78.5 kg (173 lb 1 oz) IBW/kg (Calculated) : 73   Vital Signs: Temp: 98 F (36.7 C) (06/07 1114) Temp Source: Oral (06/07 1114) BP: 134/99 (06/07 1200) Pulse Rate: 73 (06/07 1200)  Labs: Recent Labs    05/13/22 1305 05/14/22 0203 05/15/22 0211  HGB 12.8* 12.2* 11.3*  HCT 37.5* 36.8* 33.5*  PLT 205 214 207  LABPROT 27.4* 30.1* 21.4*  INR 2.6* 2.9* 1.9*  CREATININE 1.17 0.96 0.80     Estimated Creatinine Clearance: 100.1 mL/min (by C-G formula based on SCr of 0.8 mg/dL).   Medical History: Past Medical History:  Diagnosis Date   "    Arthritis    CAD (coronary artery disease)    a. s/p CABG in 11/2019 with LIMA-LAD, SVG-OM1, SVG-PDA and SVG-D1   CHF (congestive heart failure) (HCC)    a. EF < 20% by echo in 11/2019   Essential hypertension    PAF (paroxysmal atrial fibrillation) (HCC)    Type 2 diabetes mellitus ([place])     Assessment: 61yom with LVAD HM3 implanted 2021, admitted for drive line infection.  INR 2.6 on admit- no bleeding, h/h stable  Holding warfarin for now planning debridement 6/8 Will bridge with heparin  INR 1.9    Goal of Therapy:  INR 2-2.5 Heparin level approx 0.3 Monitor platelets by anticoagulation protocol: Yes   Plan:  Heparin drip low dose 500 uts/hr  - no titration today  Check heparin level tonight to ensure not > 0.3 Hold warfarin for now  Daily protime, heparin level and cbc    Bonnita Nasuti Pharm.D. CPP, BCPS Clinical Pharmacist [phone] 05/15/2022 1:59 PM

## 2022-05-15 NOTE — Progress Notes (Signed)
Pharmacy Antibiotic Note  Kevin Underwood is a 61 y.o. male with Hs HF and LVAD implant 2021 was admitted on 05/13/2022 with drive line infection.Patient needs broad spectrum GP and pseudomonal and anaerobic coverage. Pharmacy has been consulted for vancomycin and zosyn dosing.  Scr 0.8 mg/dL Vancomycin trough 8 mcg/ml (on vancomycin 750mg  q12hr)  Plan: Will increase to vancomycin 1000mg  q12hr  Plan to obtain levels at steady state if therapy continued  Will monitor for acute changes in renal function and adjust as needed F/u cultures results and de-escalate as appropriate   Height: 5\' 10"  (177.8 cm) Weight: 78.5 kg (173 lb 1 oz) IBW/kg (Calculated) : 73  Temp (24hrs), Avg:98.3 F (36.8 C), Min:98 F (36.7 C), Max:98.7 F (37.1 C)  Recent Labs  Lab 05/13/22 1305 05/14/22 0203 05/15/22 0211 05/15/22 1650  WBC 15.6* 11.2* 10.9*  --   CREATININE 1.17 0.96 0.80  --   LATICACIDVEN 2.5*  --   --   --   VANCOTROUGH  --   --   --  8*     Estimated Creatinine Clearance: 100.1 mL/min (by C-G formula based on SCr of 0.8 mg/dL).    No Known Allergies  Antimicrobials this admission: 6/5 vanc > 6/5 zosyn >   Thank you for allowing pharmacy to be a part of this patient's care.  07/14/22, PharmD Clinical Pharmacist  Please check AMION for all [facility] numbers After 10:00 PM, call Main Pharmacy [phone]

## 2022-05-15 NOTE — Progress Notes (Addendum)
Dr. Shirlee Latch made aware of patient having increased pain despite of getting PRN medication, and MAPs above 100 consistently throughout shift, despite being given hydrazine. MD notified of increased abdominal distention.  New orders received to give 10 mg of hydralazine IV now and increase scheduled dose to 50 mg. Also to increase fentanyl 50 mcg and oxycodone  to 10 mg.

## 2022-05-16 ENCOUNTER — Encounter (HOSPITAL_COMMUNITY)
Admission: RE | Disposition: A | Discharge status: Home Health | Payer: Self-pay | Source: Other Acute Inpatient Hospital | Attending: Cardiology

## 2022-05-16 ENCOUNTER — Encounter (HOSPITAL_COMMUNITY): Payer: Self-pay | Admitting: Cardiology

## 2022-05-16 ENCOUNTER — Inpatient Hospital Stay (HOSPITAL_COMMUNITY): Payer: 59 | Admitting: Anesthesiology

## 2022-05-16 DIAGNOSIS — I509 Heart failure, unspecified: Secondary | ICD-10-CM | POA: Diagnosis not present

## 2022-05-16 DIAGNOSIS — N179 Acute kidney failure, unspecified: Secondary | ICD-10-CM | POA: Diagnosis not present

## 2022-05-16 DIAGNOSIS — T827XXA Infection and inflammatory reaction due to other cardiac and vascular devices, implants and grafts, initial encounter: Secondary | ICD-10-CM | POA: Diagnosis not present

## 2022-05-16 DIAGNOSIS — I110 Hypertensive heart disease with heart failure: Secondary | ICD-10-CM

## 2022-05-16 DIAGNOSIS — T8579XA Infection and inflammatory reaction due to other internal prosthetic devices, implants and grafts, initial encounter: Secondary | ICD-10-CM | POA: Diagnosis not present

## 2022-05-16 DIAGNOSIS — Z87891 Personal history of nicotine dependence: Secondary | ICD-10-CM

## 2022-05-16 DIAGNOSIS — T829XXD Unspecified complication of cardiac and vascular prosthetic device, implant and graft, subsequent encounter: Secondary | ICD-10-CM

## 2022-05-16 DIAGNOSIS — I25119 Atherosclerotic heart disease of native coronary artery with unspecified angina pectoris: Secondary | ICD-10-CM

## 2022-05-16 DIAGNOSIS — Z95811 Presence of heart assist device: Secondary | ICD-10-CM | POA: Diagnosis not present

## 2022-05-16 HISTORY — PX: STERNAL WOUND DEBRIDEMENT: SHX1058

## 2022-05-16 LAB — PROTIME-INR
INR: 1.9 — ABNORMAL HIGH (ref 0.8–1.2)
Prothrombin Time: 22 seconds — ABNORMAL HIGH (ref 11.4–15.2)

## 2022-05-16 LAB — GLUCOSE, CAPILLARY
Glucose-Capillary: 158 mg/dL — ABNORMAL HIGH (ref 70–99)
Glucose-Capillary: 157 mg/dL — ABNORMAL HIGH (ref 70–99)
Glucose-Capillary: 198 mg/dL — ABNORMAL HIGH (ref 70–99)
Glucose-Capillary: 298 mg/dL — ABNORMAL HIGH (ref 70–99)
Glucose-Capillary: 270 mg/dL — ABNORMAL HIGH (ref 70–99)

## 2022-05-16 LAB — BASIC METABOLIC PANEL
Anion gap: 10 (ref 5–15)
BUN: 9 mg/dL (ref 8–23)
CO2: 23 mmol/L (ref 22–32)
Calcium: 8.5 mg/dL — ABNORMAL LOW (ref 8.9–10.3)
Chloride: 101 mmol/L (ref 98–111)
Creatinine, Ser: 0.81 mg/dL (ref 0.61–1.24)
GFR, Estimated: >60 mL/min (ref >60)
Glucose, Bld: 139 mg/dL — ABNORMAL HIGH (ref 70–99)
Potassium: 3.5 mmol/L (ref 3.5–5.1)
Sodium: 134 mmol/L — ABNORMAL LOW (ref 135–145)

## 2022-05-16 LAB — MAGNESIUM: Magnesium: 1.5 mg/dL — ABNORMAL LOW (ref 1.7–2.4)

## 2022-05-16 LAB — CBC
HCT: 34.6 % — ABNORMAL LOW (ref 39.0–52.0)
Hemoglobin: 11.6 g/dL — ABNORMAL LOW (ref 13.0–17.0)
MCH: 27.3 pg (ref 26.0–34.0)
MCHC: 33.5 g/dL (ref 30.0–36.0)
MCV: 81.4 fL (ref 80.0–100.0)
Platelets: 230 10*3/uL (ref 150–400)
RBC: 4.25 MIL/uL (ref 4.22–5.81)
RDW: 15.5 % (ref 11.5–15.5)
WBC: 11.4 10*3/uL — ABNORMAL HIGH (ref 4.0–10.5)
nRBC: 0 % (ref 0.0–0.2)

## 2022-05-16 LAB — LACTATE DEHYDROGENASE: LDH: 127 U/L (ref 98–192)

## 2022-05-16 LAB — HEPARIN LEVEL (UNFRACTIONATED): Heparin Unfractionated: <0.1 IU/mL — ABNORMAL LOW (ref 0.30–0.70)

## 2022-05-16 SURGERY — DEBRIDEMENT, WOUND, STERNUM
Anesthesia: General

## 2022-05-16 MED ORDER — LACTATED RINGERS IV SOLN: INTRAVENOUS | Status: DC

## 2022-05-16 MED ORDER — HEPARIN (PORCINE) 25000 UT/250ML-% IV SOLN: 500.0000 [IU]/h | INTRAVENOUS | Status: DC

## 2022-05-16 MED ORDER — FENTANYL CITRATE (PF) 250 MCG/5ML IJ SOLN
INTRAMUSCULAR | Status: AC
  Filled 2022-05-16: qty 5

## 2022-05-16 MED ORDER — VANCOMYCIN HCL 500 MG IV SOLR
INTRAVENOUS | Status: AC
  Filled 2022-05-16: qty 10

## 2022-05-16 MED ORDER — PHENYLEPHRINE HCL-NACL 20-0.9 MG/250ML-% IV SOLN
INTRAVENOUS | Status: DC | PRN
  Administered 2022-05-16: 25 ug/min via INTRAVENOUS

## 2022-05-16 MED ORDER — SUGAMMADEX SODIUM 200 MG/2ML IV SOLN
INTRAVENOUS | Status: DC | PRN
  Administered 2022-05-16: 200 mg via INTRAVENOUS

## 2022-05-16 MED ORDER — PHENYLEPHRINE HCL (PRESSORS) 10 MG/ML IV SOLN
INTRAVENOUS | Status: AC
Start: 2022-05-16 — End: ?
  Filled 2022-05-16: qty 1

## 2022-05-16 MED ORDER — WARFARIN - PHARMACIST DOSING INPATIENT
Freq: Every day | Status: DC
  Administered 2022-05-18: 1

## 2022-05-16 MED ORDER — CELECOXIB 200 MG PO CAPS
200.0000 mg | ORAL_CAPSULE | Freq: Once | ORAL | Status: AC
Start: 2022-05-16 — End: 2022-05-16

## 2022-05-16 MED ORDER — DEXAMETHASONE SODIUM PHOSPHATE 10 MG/ML IJ SOLN
INTRAMUSCULAR | Status: DC | PRN
  Administered 2022-05-16: 10 mg via INTRAVENOUS

## 2022-05-16 MED ORDER — ALBUTEROL SULFATE (2.5 MG/3ML) 0.083% IN NEBU
2.5000 mg | INHALATION_SOLUTION | Freq: Once | RESPIRATORY_TRACT | Status: AC
  Administered 2022-05-16: 2.5 mg via RESPIRATORY_TRACT

## 2022-05-16 MED ORDER — ACETAMINOPHEN 500 MG PO TABS: 1000.0000 mg | ORAL_TABLET | Freq: Once | ORAL | Status: AC

## 2022-05-16 MED ORDER — MIDAZOLAM HCL 2 MG/2ML IJ SOLN
INTRAMUSCULAR | Status: DC | PRN
  Administered 2022-05-16 (×2): 1 mg via INTRAVENOUS

## 2022-05-16 MED ORDER — FENTANYL CITRATE (PF) 250 MCG/5ML IJ SOLN
INTRAMUSCULAR | Status: DC | PRN
  Administered 2022-05-16 (×4): 50 ug via INTRAVENOUS

## 2022-05-16 MED ORDER — ETOMIDATE 2 MG/ML IV SOLN
INTRAVENOUS | Status: DC | PRN
  Administered 2022-05-16: 20 mg via INTRAVENOUS

## 2022-05-16 MED ORDER — ONDANSETRON HCL 4 MG/2ML IJ SOLN
INTRAMUSCULAR | Status: DC | PRN
  Administered 2022-05-16: 4 mg via INTRAVENOUS

## 2022-05-16 MED ORDER — WARFARIN SODIUM 2 MG PO TABS
4.0000 mg | ORAL_TABLET | Freq: Once | ORAL | Status: AC
  Administered 2022-05-16: 4 mg via ORAL
  Filled 2022-05-16: qty 2

## 2022-05-16 MED ORDER — CELECOXIB 200 MG PO CAPS
ORAL_CAPSULE | ORAL | Status: AC
  Administered 2022-05-16: 200 mg via ORAL
  Filled 2022-05-16: qty 1

## 2022-05-16 MED ORDER — MIDAZOLAM HCL 2 MG/2ML IJ SOLN
INTRAMUSCULAR | Status: AC
  Filled 2022-05-16: qty 2

## 2022-05-16 MED ORDER — BOOST PO LIQD
237.0000 mL | Freq: Three times a day (TID) | ORAL | Status: DC
  Administered 2022-05-16 – 2022-05-30 (×27): 237 mL via ORAL
  Filled 2022-05-16 (×44): qty 237

## 2022-05-16 MED ORDER — PHENYLEPHRINE 80 MCG/ML (10ML) SYRINGE FOR IV PUSH (FOR BLOOD PRESSURE SUPPORT)
PREFILLED_SYRINGE | INTRAVENOUS | Status: DC | PRN
  Administered 2022-05-16: 80 ug via INTRAVENOUS
  Administered 2022-05-16: 40 ug via INTRAVENOUS

## 2022-05-16 MED ORDER — ALBUTEROL SULFATE (2.5 MG/3ML) 0.083% IN NEBU
INHALATION_SOLUTION | RESPIRATORY_TRACT | Status: AC
  Filled 2022-05-16: qty 3

## 2022-05-16 MED ORDER — ACETAMINOPHEN 500 MG PO TABS
ORAL_TABLET | ORAL | Status: AC
  Administered 2022-05-16: 1000 mg via ORAL
  Filled 2022-05-16: qty 2

## 2022-05-16 MED ORDER — VANCOMYCIN HCL 1000 MG IV SOLR
INTRAVENOUS | Status: DC | PRN
  Administered 2022-05-16: 1000 mg

## 2022-05-16 MED ORDER — SODIUM CHLORIDE 0.9 % IR SOLN
Status: DC | PRN
  Administered 2022-05-16: 2000 mL

## 2022-05-16 MED ORDER — ROCURONIUM BROMIDE 10 MG/ML (PF) SYRINGE
PREFILLED_SYRINGE | INTRAVENOUS | Status: DC | PRN
  Administered 2022-05-16: 80 mg via INTRAVENOUS

## 2022-05-16 MED ORDER — LIDOCAINE 2% (20 MG/ML) 5 ML SYRINGE
INTRAMUSCULAR | Status: AC
  Filled 2022-05-16: qty 5

## 2022-05-16 MED ORDER — VANCOMYCIN HCL 1000 MG IV SOLR
INTRAVENOUS | Status: AC
  Filled 2022-05-16: qty 20

## 2022-05-16 MED ORDER — CHLORHEXIDINE GLUCONATE 0.12 % MT SOLN: 15.0000 mL | Freq: Once | OROMUCOSAL | Status: AC

## 2022-05-16 MED ORDER — HEPARIN (PORCINE) 25000 UT/250ML-% IV SOLN
500.0000 [IU]/h | INTRAVENOUS | Status: DC
  Filled 2022-05-16: qty 250

## 2022-05-16 MED ORDER — ROCURONIUM BROMIDE 10 MG/ML (PF) SYRINGE
PREFILLED_SYRINGE | INTRAVENOUS | Status: AC
Start: 2022-05-16 — End: ?
  Filled 2022-05-16: qty 10

## 2022-05-16 MED ORDER — HYDRALAZINE HCL 20 MG/ML IJ SOLN
10.0000 mg | Freq: Once | INTRAMUSCULAR | Status: AC
  Administered 2022-05-16: 10 mg via INTRAVENOUS
  Filled 2022-05-16: qty 1

## 2022-05-16 MED ORDER — CHLORHEXIDINE GLUCONATE 0.12 % MT SOLN
OROMUCOSAL | Status: AC
  Administered 2022-05-16: 15 mL via OROMUCOSAL
  Filled 2022-05-16: qty 15

## 2022-05-16 MED ORDER — MAGNESIUM SULFATE 4 GM/100ML IV SOLN
4.0000 g | Freq: Once | INTRAVENOUS | Status: AC
Start: 2022-05-16 — End: 2022-05-16
  Administered 2022-05-16: 4 g via INTRAVENOUS
  Filled 2022-05-16: qty 100

## 2022-05-16 MED ORDER — ZINC SULFATE 220 (50 ZN) MG PO CAPS
220.0000 mg | ORAL_CAPSULE | Freq: Every day | ORAL | Status: DC
  Administered 2022-05-16 – 2022-05-22 (×7): 220 mg via ORAL
  Filled 2022-05-16 (×7): qty 1

## 2022-05-16 MED ORDER — POTASSIUM CHLORIDE CRYS ER 20 MEQ PO TBCR
40.0000 meq | EXTENDED_RELEASE_TABLET | Freq: Once | ORAL | Status: AC
Start: 2022-05-16 — End: 2022-05-16
  Administered 2022-05-16: 40 meq via ORAL
  Filled 2022-05-16: qty 2

## 2022-05-16 MED ORDER — ETOMIDATE 2 MG/ML IV SOLN
INTRAVENOUS | Status: AC
  Filled 2022-05-16: qty 10

## 2022-05-16 MED ORDER — POLYETHYLENE GLYCOL 3350 17 G PO PACK
17.0000 g | PACK | Freq: Every day | ORAL | Status: DC
  Administered 2022-05-16 – 2022-05-18 (×2): 17 g via ORAL
  Filled 2022-05-16 (×4): qty 1

## 2022-05-16 MED ORDER — SODIUM CHLORIDE 0.9 % IR SOLN
Status: DC | PRN
  Administered 2022-05-16: 1000 mL

## 2022-05-16 MED ORDER — LIDOCAINE 2% (20 MG/ML) 5 ML SYRINGE
INTRAMUSCULAR | Status: DC | PRN
  Administered 2022-05-16: 100 mg via INTRAVENOUS

## 2022-05-16 MED ORDER — ORAL CARE MOUTH RINSE: 15.0000 mL | Freq: Once | OROMUCOSAL | Status: AC

## 2022-05-16 SURGICAL SUPPLY — 63 items
APL SKNCLS STERI-STRIP NONHPOA (GAUZE/BANDAGES/DRESSINGS)
ATTRACTOMAT 16X20 MAGNETIC DRP (DRAPES) ×2 IMPLANT
BAG DECANTER FOR FLEXI CONT (MISCELLANEOUS) ×2 IMPLANT
BENZOIN TINCTURE PRP APPL 2/3 (GAUZE/BANDAGES/DRESSINGS) IMPLANT
BLADE CLIPPER SURG (BLADE) ×2 IMPLANT
BLADE SURG 10 STRL SS (BLADE) ×2 IMPLANT
BNDG GAUZE ELAST 4 BULKY (GAUZE/BANDAGES/DRESSINGS) IMPLANT
CANISTER SUCT 3000ML PPV (MISCELLANEOUS) ×2 IMPLANT
CATH FOLEY 2WAY SLVR  5CC 16FR (CATHETERS)
CATH FOLEY 2WAY SLVR 5CC 16FR (CATHETERS) IMPLANT
CATH THORACIC 28FR RT ANG (CATHETERS) IMPLANT
CATH THORACIC 36FR (CATHETERS) IMPLANT
CLIP VESOCCLUDE SM WIDE 24/CT (CLIP) IMPLANT
CNTNR URN SCR LID CUP LEK RST (MISCELLANEOUS) IMPLANT
CONN Y 3/8X3/8X3/8  BEN (MISCELLANEOUS)
CONN Y 3/8X3/8X3/8 BEN (MISCELLANEOUS) IMPLANT
CONT SPEC 4OZ STRL OR WHT (MISCELLANEOUS)
CONTAINER PROTECT SURGISLUSH (MISCELLANEOUS) ×4 IMPLANT
COVER SURGICAL LIGHT HANDLE (MISCELLANEOUS) ×4 IMPLANT
DRAPE LAPAROSCOPIC ABDOMINAL (DRAPES) ×2 IMPLANT
DRAPE WARM FLUID 44X44 (DRAPES) IMPLANT
DRSG AQUACEL AG ADV 3.5X14 (GAUZE/BANDAGES/DRESSINGS) ×2 IMPLANT
DRSG PAD ABDOMINAL 8X10 ST (GAUZE/BANDAGES/DRESSINGS) IMPLANT
ELECT REM PT RETURN 9FT ADLT (ELECTROSURGICAL) ×2
ELECTRODE REM PT RTRN 9FT ADLT (ELECTROSURGICAL) ×1 IMPLANT
GAUZE 4X4 16PLY ~~LOC~~+RFID DBL (SPONGE) ×2 IMPLANT
GAUZE SPONGE 4X4 12PLY STRL (GAUZE/BANDAGES/DRESSINGS) ×2 IMPLANT
GAUZE XEROFORM 5X9 LF (GAUZE/BANDAGES/DRESSINGS) IMPLANT
GLOVE BIO SURGEON STRL SZ7.5 (GLOVE) ×2 IMPLANT
GLOVE BIOGEL PI IND STRL 6.5 (GLOVE) IMPLANT
GLOVE BIOGEL PI INDICATOR 6.5 (GLOVE) ×2
GOWN STRL REUS W/ TWL LRG LVL3 (GOWN DISPOSABLE) ×4 IMPLANT
GOWN STRL REUS W/TWL LRG LVL3 (GOWN DISPOSABLE) ×8
HANDPIECE INTERPULSE COAX TIP (DISPOSABLE)
HEMOSTAT POWDER SURGIFOAM 1G (HEMOSTASIS) IMPLANT
HEMOSTAT SURGICEL 2X14 (HEMOSTASIS) IMPLANT
KIT BASIN OR (CUSTOM PROCEDURE TRAY) ×2 IMPLANT
KIT SUCTION CATH 14FR (SUCTIONS) IMPLANT
KIT TURNOVER KIT B (KITS) ×2 IMPLANT
NS IRRIG 1000ML POUR BTL (IV SOLUTION) ×2 IMPLANT
PACK CHEST (CUSTOM PROCEDURE TRAY) ×2 IMPLANT
PAD ARMBOARD 7.5X6 YLW CONV (MISCELLANEOUS) ×4 IMPLANT
PULSAVAC PLUS IRRIG FAN TIP (DISPOSABLE) ×2
SET HNDPC FAN SPRY TIP SCT (DISPOSABLE) IMPLANT
SOL PREP POV-IOD 4OZ 10% (MISCELLANEOUS) IMPLANT
SPONGE T-LAP 18X18 ~~LOC~~+RFID (SPONGE) ×10 IMPLANT
SPONGE T-LAP 4X18 ~~LOC~~+RFID (SPONGE) ×2 IMPLANT
STAPLER VISISTAT 35W (STAPLE) IMPLANT
SUT ETHILON 3 0 FSL (SUTURE) IMPLANT
SUT STEEL 6MS V (SUTURE) IMPLANT
SUT STEEL STERNAL CCS#1 18IN (SUTURE) IMPLANT
SUT STEEL SZ 6 DBL 3X14 BALL (SUTURE) IMPLANT
SUT VIC AB 1 CTX 36 (SUTURE) ×4
SUT VIC AB 1 CTX36XBRD ANBCTR (SUTURE) ×2 IMPLANT
SUT VIC AB 2-0 CTX 27 (SUTURE) ×4 IMPLANT
SUT VIC AB 3-0 X1 27 (SUTURE) ×4 IMPLANT
SWAB COLLECTION DEVICE MRSA (MISCELLANEOUS) IMPLANT
SWAB CULTURE ESWAB REG 1ML (MISCELLANEOUS) IMPLANT
SYR 5ML LL (SYRINGE) IMPLANT
TIP FAN IRRIG PULSAVAC PLUS (DISPOSABLE) IMPLANT
TOWEL GREEN STERILE (TOWEL DISPOSABLE) ×2 IMPLANT
TOWEL GREEN STERILE FF (TOWEL DISPOSABLE) ×2 IMPLANT
WATER STERILE IRR 1000ML POUR (IV SOLUTION) ×2 IMPLANT

## 2022-05-16 NOTE — Progress Notes (Signed)
Pt blood pressure and MAPs elevated upon arrival to unit and is continuing to rise.  PRN hydralazine given with little change in BP. In addition, pt extremely uncomfortable in bed due to pain from I&D procedure. PRN pain medication given with little relief. Assisted patient in repositioning to try to provide additional relief. NP messaged and MD paged regarding status.

## 2022-05-16 NOTE — Transfer of Care (Signed)
Immediate Anesthesia Transfer of Care Note  Patient: Kevin Underwood  Procedure(s) Performed: VAD DRIVELINE DEBRIDEMENT  Patient Location: PACU  Anesthesia Type:General  Level of Consciousness: awake, alert , oriented, patient cooperative and responds to stimulation  Airway & Oxygen Therapy: Patient Spontanous Breathing  Post-op Assessment: Report given to RN and Post -op Vital signs reviewed and stable  Post vital signs: Reviewed and stable  Last Vitals:  Vitals Value Taken Time  BP    Temp    Pulse 75 05/16/22 0917  Resp 17 05/16/22 0917  SpO2 91 % 05/16/22 0917  Vitals shown include unvalidated device data.  Last Pain:  Vitals:   05/16/22 0400  TempSrc: Oral  PainSc: Asleep      Patients Stated Pain Goal: 0 (05/15/22 1800)  Complications: No notable events documented.

## 2022-05-16 NOTE — Progress Notes (Signed)
Pre Procedure note for inpatients:   Kevin Underwood has been scheduled for Procedure(s) with comments: VAD DRIVELINE DEBRIDEMENT (N/A) - VANC PULSE LAVAGE today. The various methods of treatment have been discussed with the patient. After consideration of the risks, benefits and treatment options the patient has consented to the planned procedure.   The patient has been seen and labs reviewed. There are no changes in the patient's condition to prevent proceeding with the planned procedure today.  Recent labs:  Lab Results  Component Value Date   WBC 11.4 (H) 05/16/2022   HGB 11.6 (L) 05/16/2022   HCT 34.6 (L) 05/16/2022   PLT 230 05/16/2022   GLUCOSE 139 (H) 05/16/2022   CHOL 142 09/11/2021   TRIG 95 09/11/2021   HDL 62 09/11/2021   LDLCALC 61 09/11/2021   ALT 21 05/13/2022   AST 19 05/13/2022   NA 134 (L) 05/16/2022   K 3.5 05/16/2022   CL 101 05/16/2022   CREATININE 0.81 05/16/2022   BUN 9 05/16/2022   CO2 23 05/16/2022   TSH 7.790 (H) 05/14/2022   INR 1.9 (H) 05/16/2022   HGBA1C 8.2 (H) 05/13/2022    Lovett Sox, MD 05/16/2022 7:32 AM

## 2022-05-16 NOTE — Progress Notes (Signed)
CSW met with patient at bedside at the request of VAD Coordinator Ebony Hail haggard, RN due to concerns of adjustment to life with an LVAD. Patient spoke of events leading up to the hospital admission. He states that his wife does his weekly driveline dressing change and his has good support from his mother although she lives a few miles away. Patient shared at length about his financial concerns and limited to only his disability check to manage all the household finances. He did state that his son is now residing with them and has been added to the lease. He states the son assists with half the rent so that has helped relieve some financial pressure. 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  CSW discussed life with an LVAD and patient acknowledged that it can be stressful at times. CSW discussed LVAD Support Group and encouraged patient to attend the support group. CSW also offered a VAD patient to visit at bedside although denied need at this time stating not up it.  CSW will continue to be available as needed and will follow through the VAD clinic post hospitalization. Raquel Sarna, [place], [place]

## 2022-05-16 NOTE — Anesthesia Postprocedure Evaluation (Signed)
Anesthesia Post Note  Patient: Kevin Underwood  Procedure(s) Performed: VAD DRIVELINE DEBRIDEMENT     Patient location during evaluation: PACU Anesthesia Type: General Level of consciousness: sedated Pain management: pain level controlled Vital Signs Assessment: post-procedure vital signs reviewed and stable Respiratory status: spontaneous breathing and respiratory function stable Cardiovascular status: stable Postop Assessment: no apparent nausea or vomiting Anesthetic complications: no   No notable events documented.  Last Vitals:  Vitals:   05/16/22 0927 05/16/22 0943  BP: 124/81 125/87  Pulse: 71 72  Resp: 14 12  Temp:  36.6 C  SpO2: 100% 90%    Last Pain:  Vitals:   05/16/22 0943  TempSrc:   PainSc: 0-No pain                 Cassady Stanczak,[place] DANIEL

## 2022-05-16 NOTE — Progress Notes (Signed)
VAD Coordinator Procedure Note:   VAD Coordinator met patient in holding room. Pt undergoing LVAD drive line wound debridement per Dr. Darcey Nora. Hemodynamics and VAD parameters monitored by myself and anesthesia throughout the procedure. Blood pressures were obtained with automatic cuff on left arm and correlated with Doppler  modified systolic pressure.    Time: Doppler Auto  BP Flow PI Power Speed  Pre-procedure:           07:00 136 138/93 (108) 3.7 6.0 4.2 5500           Secation Induction:          07:45  122/100 (106) 3.8 4.7 4.0    08:00  10794 (100) 3.8 4.8 4.1    08:10  67/53 (59) 3.7 8.7 4.1    08:20  129/112 (118) 3.4 6.3 4.2    08:30  137/99 (111) 3.9 4.3 4.1    08:45  82/68 (75) 4.6 2.2 4.0    09:00  88/74 (80) 4.3 2.8 4.1            Recovery Area:          09:15  137/81 (99) 4.3 3.3 4.0    09:30  121/84 (94) 3.8 5.3 4.1     Patient tolerated the procedure well. VAD Coordinator accompanied and remained with patient in recovery area.    Patient Disposition: 9V23  Zada Girt RN, VAD Coordinator 24/7 VAD pager: [phone]

## 2022-05-16 NOTE — Progress Notes (Signed)
ANTICOAGULATION CONSULT NOTE -  Consult  Pharmacy Consult for Warfarin Indication:  LVAD  No Known Allergies  Patient Measurements: Height: 5\' 10"  (177.8 cm) Weight: 78.5 kg (173 lb 1 oz) IBW/kg (Calculated) : 73   Vital Signs: Temp: 97.8 F (36.6 C) (06/08 0943) Temp Source: Oral (06/08 0400) BP: 131/88 (06/08 1200) Pulse Rate: 79 (06/08 1200)  Labs: Recent Labs    05/14/22 0203 05/15/22 0211 05/15/22 1650 05/16/22 0058 05/16/22 0059  HGB 12.2* 11.3*  --  11.6*  --   HCT 36.8* 33.5*  --  34.6*  --   PLT 214 207  --  230  --   LABPROT 30.1* 21.4*  --  22.0*  --   INR 2.9* 1.9*  --  1.9*  --   HEPARINUNFRC  --   --  <0.10*  --  <0.10*  CREATININE 0.96 0.80  --  0.81  --      Estimated Creatinine Clearance: 98.9 mL/min (by C-G formula based on SCr of 0.81 mg/dL).   Medical History: Past Medical History:  Diagnosis Date   "    Arthritis    CAD (coronary artery disease)    a. s/p CABG in 11/2019 with LIMA-LAD, SVG-OM1, SVG-PDA and SVG-D1   CHF (congestive heart failure) (HCC)    a. EF < 20% by echo in 11/2019   Essential hypertension    PAF (paroxysmal atrial fibrillation) (HCC)    Type 2 diabetes mellitus ([place])     Assessment: 61yom with LVAD HM3 implanted 2021, admitted for drive line infection.  INR 2.6 on admit- no bleeding, h/h stable  Warfarin held for planned debridement 6/8 S/p procedure ok to resume warfarin INR 1.9    PTA warfarin 4mg  TTSS / 6mg  MWF  Goal of Therapy:  INR 2-2.5 Heparin level approx 0.3 Monitor platelets by anticoagulation protocol: Yes   Plan:  No heparin post op  Resume warfarin 4mg  x1 warfarin for now  Daily protime, and  cbc    Bonnita Nasuti Pharm.D. CPP, BCPS Clinical Pharmacist [phone] 05/16/2022 12:56 PM

## 2022-05-16 NOTE — Brief Op Note (Signed)
05/16/2022  9:15 AM  PATIENT:  Kevin Underwood  61 y.o. male  PRE-OPERATIVE DIAGNOSIS:  VENTRICULAR ASSIST DEVICE DRIVELINE INFECTION  POST-OPERATIVE DIAGNOSIS:  VENTRICULAR ASSIST DEVICE DRIVELINE INFECTION  PROCEDURE:  Procedure(s) with comments: VAD DRIVELINE DEBRIDEMENT (N/A) - VANC PULSE LAVAGE  SURGEON:  Surgeon(s) and Role:    [name], MD - Primary  PHYSICIAN ASSISTANT:   ASSISTANTS: TOW RNFA   ANESTHESIA:   general  EBL: 5 cc  BLOOD ADMINISTERED:none  DRAINS: none   LOCAL MEDICATIONS USED:  NONE  SPECIMEN:  cultures sent aerobioc anerobic  DISPOSITION OF SPECIMEN:   microlbiology ab  COUNTS:  YES  TOURNIQUET:  * No tourniquets in log *  DICTATION: .Dragon Dictation  PLAN OF CARE:  return to [facility]  PATIENT DISPOSITION:  PACU - hemodynamically stable.   Delay start of Pharmacological VTE agent (>24hrs) due to surgical blood loss or risk of bleeding: resume iv heparin 500u/ hour at 4 pm today. Coumadin dosing to resume this pm

## 2022-05-16 NOTE — Anesthesia Procedure Notes (Signed)
Procedure Name: Intubation Date/Time: 05/16/2022 7:54 AM  Performed by: Betha Loa, CRNAPre-anesthesia Checklist: Patient identified, Emergency Drugs available, Suction available and Patient being monitored Patient Re-evaluated:Patient Re-evaluated prior to induction Oxygen Delivery Method: Circle System Utilized Preoxygenation: Pre-oxygenation with 100% oxygen Induction Type: IV induction Ventilation: Mask ventilation without difficulty and Oral airway inserted - appropriate to patient size Laryngoscope Size: Mac and 4 Grade View: Grade I Tube type: Oral Tube size: 7.5 mm Number of attempts: 1 Airway Equipment and Method: Stylet and Oral airway Placement Confirmation: ETT inserted through vocal cords under direct vision, positive ETCO2 and breath sounds checked- equal and bilateral Secured at: 22 cm Tube secured with: Tape Dental Injury: Teeth and Oropharynx as per pre-operative assessment

## 2022-05-16 NOTE — Progress Notes (Addendum)
Patient ID: DYKE WEIBLE, male   DOB: 1961/03/25, 61 y.o.   MRN: [id number]     Advanced Heart Failure Rounding Note  PCP-Cardiologist: Nona Dell, MD   Subjective:    6/5 Admitted with sepsis/driveline infection. Started on vanc and zosyn . Planning for surgical debridement.  6/7 ID consulted.  6/8 Surgical debridement with deep cultures.   Blood/driveline cultures NGTD.   CT chest/abd/pelvis-->mild fluid collection in the lateral abdominal wall.   Complaining of abdominal pain.   LVAD Interrogation HM III: Speed: 5500 Flow: 3.9 PI: 4.2 Power: 4.   Objective:   Weight Range: 78.5 kg Body mass index is 24.83 kg/m.   Vital Signs:   Temp:  [97.8 F (36.6 C)-98.6 F (37 C)] 97.8 F (36.6 C) (06/08 0943) Pulse Rate:  [71-81] 72 (06/08 0943) Resp:  [10-25] 12 (06/08 0943) BP: (105-154)/(75-131) 125/87 (06/08 0943) SpO2:  [90 %-100 %] 90 % (06/08 0943) Last BM Date : 05/13/22  Weight change: Filed Weights   05/14/22 0500  Weight: 78.5 kg    Intake/Output:   Intake/Output Summary (Last 24 hours) at 05/16/2022 0955 Last data filed at 05/16/2022 0854 Gross per 24 hour  Intake 1905.13 ml  Output 750 ml  Net 1155.13 ml      Physical Exam  Maps 100s  Physical Exam: GENERAL: No acute distress. Drowsy. HEENT: normal  NECK: Supple, JVP flat  .  2+ bilaterally, no bruits.  No lymphadenopathy or thyromegaly appreciated.   CARDIAC:  Mechanical heart sounds with LVAD hum present.  LUNGS:  Clear to auscultation bilaterally.  ABDOMEN:  Soft, round, nontender, positive bowel sounds x4.     LVAD exit site: Dressing dry and intact.  EXTREMITIES:  Warm and dry, no cyanosis, clubbing, rash or edema . RUE PICC NEUROLOGIC:  Alert and oriented x 3.    No aphasia.  No dysarthria.   Telemetry   SR 80-90s  EKG    N/A   Labs    CBC Recent Labs    05/13/22 1305 05/14/22 0203 05/15/22 0211 05/16/22 0058  WBC 15.6*   < > 10.9* 11.4*  NEUTROABS 12.2*  --   --   --    HGB 12.8*   < > 11.3* 11.6*  HCT 37.5*   < > 33.5* 34.6*  MCV 79.1*   < > 80.9 81.4  PLT 205   < > 207 230   < > = values in this interval not displayed.   Basic Metabolic Panel Recent Labs    46/27/03 0211 05/16/22 0058  NA 134* 134*  K 3.6 3.5  CL 100 101  CO2 25 23  GLUCOSE 144* 139*  BUN 12 9  CREATININE 0.80 0.81  CALCIUM 8.5* 8.5*  MG 1.7 1.5*   Liver Function Tests Recent Labs    05/13/22 1305  AST 19  ALT 21  ALKPHOS 111  BILITOT 0.7  PROT 7.2  ALBUMIN 3.1*   No results for input(s): "LIPASE", "AMYLASE" in the last 72 hours. Cardiac Enzymes No results for input(s): "CKTOTAL", "CKMB", "CKMBINDEX", "TROPONINI" in the last 72 hours.  BNP: BNP (last 3 results) No results for input(s): "BNP" in the last 8760 hours.  ProBNP (last 3 results) No results for input(s): "PROBNP" in the last 8760 hours.   D-Dimer No results for input(s): "DDIMER" in the last 72 hours. Hemoglobin A1C Recent Labs    05/13/22 1309  HGBA1C 8.2*   Fasting Lipid Panel No results for input(s): "CHOL", "HDL", "LDLCALC", "  TRIG", "CHOLHDL", "LDLDIRECT" in the last 72 hours. Thyroid Function Tests Recent Labs    05/14/22 0203  TSH 7.790*    Other results:   Imaging    No results found.   Medications:     Scheduled Medications:  albuterol       Chlorhexidine Gluconate Cloth  6 each Topical Daily   hydrALAZINE  50 mg Oral Q8H   insulin aspart  0-20 Units Subcutaneous TID WC   loratadine  10 mg Oral Daily   methimazole  20 mg Oral BID   metoprolol succinate  25 mg Oral Daily   mupirocin ointment  1 application. Nasal BID   pantoprazole  40 mg Oral Daily   senna-docusate  2 tablet Oral Daily   sertraline  50 mg Oral QHS   sodium chloride flush  3 mL Intravenous Q12H   traZODone  150 mg Oral QHS    Infusions:  sodium chloride Stopped (05/15/22 0604)   sodium chloride 50 mL/hr at 05/16/22 0500   piperacillin-tazobactam (ZOSYN)  IV 3.375 g (05/16/22 0601)    vancomycin Stopped (05/15/22 2118)    PRN Medications: sodium chloride, acetaminophen, albuterol, fentaNYL (SUBLIMAZE) injection, ipratropium-albuterol, ondansetron (ZOFRAN) IV, oxyCODONE, sodium chloride flush    Patient Profile   Mr Bublitz is a 61 year old with h/o HFrEF, HMIII LVAD, CAD, S/P CABG 2020 with Maze/LAA clipping, DMII, COPD, HTN, PAF, and previous ETOH abuse.    Admitted after syncope/driveline infection.  Assessment/Plan   1. ID: Suspect driveline infection.  CT abdomen/pelvis with small fluid collection along pre-peritoneal driveline.  Early sepsis. Lactate initially elevated.  MAP improved with IV fluid, creatinine lower.  Now afebrile, WBCs 11.4  Cultures negative so far.  - Zosyn/vancomycin. He has PICC.  _ ID following.  - Site debridement  today Dr. Donata Clay. Deep cultures obtained.  - Dressing changes per Dr Maren Beach.  2.  Chronic systolic CHF: Echo 10/21 with EF 20-25%, mildly decreased RV function. LHC/RHC in 12/21 with patent grafts, low output. Suspect mixed ischemic/nonischemic cardiomyopathy (prior heavy ETOH and drugs as well as CAD).  No ETOH, drugs, smoking since CABG in 12/20. Admitted with cardiogenic shock in 12/21, had placement of Impella 5.5 initially, now s/p Heartmate 3 LVAD on 11/17/20.  Ramp echo 2/23 with speed decreased to 5500 rpm.  NYHA class II, symptomatically had been doing well.  Not volume overloaded on exam. MAP elevated in 100s in setting of pain.  - INR 1.9 today . Restart coumadin today. ( Per Dr Maren Beach).    - Continue hydralazine 50 mg three times a day.  - He has not needed Lasix.  3. CAD: S/p CABG 12/20.  LHC pre-VAD with patent grafts, no target for intervention. - Continue statin.  3. Atrial fibrillation: Paroxysmal.  S/p Maze and LA appendage clip with CABG in 12/20.  NS - Off amiodarone with hyperthyroidism. - Restarting coumadin.  5. Type 2 diabetes: SSI.  6. Gout:   - Continue allopurinol 100 mg daily.  7.  PACs/PVCs  8. COPD: H/o COPD exacerbation, overall feels better on Trelegy.  - Followup with pulmonary.  9. 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  He missed his endocrinology appt.  - He is now off amiodarone.   - Continue methimazole 20 mg bid, TSH was mildly elevated.   - Will need to reschedule endocrine appt.      Length of Stay: 3  Tonye Becket, NP  05/16/2022, 9:55 AM  Advanced Heart Failure Team Pager [phone] (  M-F; 7a - 5p)  Please contact [facility] for night-coverage after hours (5p -7a ) and weekends on [email or web address]  Patient seen with NP, agree with the above note.   Now s/p driveline site debridement.   Still with pain at site, MAP elevated with pain.   General: Well appearing this am. NAD.  HEENT: Normal. Neck: Supple, JVP 7-8 cm. Carotids OK.  Cardiac:  Mechanical heart sounds with LVAD hum present.  Lungs:  CTAB, normal effort.  Abdomen:  NT, ND, no HSM. No bruits or masses. +BS  LVAD exit site: dressed post-op Extremities:  Warm and dry. No cyanosis, clubbing, rash, or edema.  Neuro:  Alert & oriented x 3. Cranial nerves grossly intact. Moves all 4 extremities w/o difficulty. Affect pleasant    Continue abx, vanc/zosyn, pending culture data.  ID to assist with duration of treatment.    Can start heparin low dose at 4 pm and warfarin this evening.   Continue hydralazine 50 mg q8 hrs, can increase if needed.  Continue pain management.   Marca Ancona 05/16/2022 10:57 AM

## 2022-05-16 NOTE — Op Note (Signed)
NAME: Kevin Underwood, Kevin Underwood MEDICAL RECORD NO: [id number] ACCOUNT NO: [id number] DATE OF BIRTH: 11-10-1961 FACILITY: [facility] LOCATION: [facility] PHYSICIAN: Ivin Poot III, MD  Operative Report   DATE OF PROCEDURE: 05/16/2022  OPERATION PERFORMED:  Excisional debridement and pulse lavage irrigation of abdominal wound at the HeartMate 3 power cord exit site.  SURGEON:  Len Childs, MD  ASSISTANT:  Levonne Lapping, RNFA  PREOPERATIVE DIAGNOSES: 1.  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 2.  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  POSTOPERATIVE DIAGNOSES: 1.  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 2.  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  ANESTHESIA:  General.  DESCRIPTION OF PROCEDURE:  The patient was evaluated in the preoperative holding where informed consent was documented and final issues addressed with the patient and the LVAD coordinator.  The LVAD coordinator accompanied the patient continuously from  preop into the OR to help monitor the LVAD equipment and to assist with hemodynamic management and also her presence continued into the recovery room where the patient was monitored, the LVAD equipment was monitored and when recovered the patient  returned back to the ICU.  DESCRIPTION OF PROCEDURE:  The patient was then brought into the operating room, placed supine on the operating table.  General anesthesia was induced and he remained stable with steady blood pressure.  Bad flows remained stable and were monitored by the  LVAD coordinator.  The previously placed dressing was removed.  The abdomen and chest were prepped and draped as a sterile field.  A proper timeout was performed.  First, purulent drainage from the power cord exit site was sent for culture, both aerobic and  anaerobic.  A space around the power cord tunnel was identified and extended approximately 6 cm medially.  The skin was then opened with a sharp incision and then cautery to expose the power cord to the junction with the borderline aspect of the cord.  It was  indurated chronically, inflamed/infected tissue and this was all sharply removed.  There was some purulence in the tunnel after this incision was made and the incision was extended deeper into the abdominal wall.  Some of the rectus muscle was  identified, but the posterior rectus sheath was not entered during the dissection or debridement.  When the sharp debridement was completed, a liter of vancomycin irrigation was pulse lavaged into the wound.  Next, hemostasis was assured.  Next two 4 x 4 gauzes were soaked in Dakin's solution and packed into the wound and covered with dry 4 x 4 dressings and sterile tape.  The patient was then reversed from anesthesia, extubated, and returned to recovery room in stable condition.  Blood loss was minimal.   PUS D: 05/16/2022 12:00:42 pm T: 05/16/2022 3:44:00 pm  JOB: [id number]

## 2022-05-16 NOTE — Progress Notes (Signed)
[facility] for Infectious Disease  Date of Admission:  05/13/2022     Total days of antibiotics 4         ASSESSMENT:  Kevin Underwood went for surgical debridement and cultures today. Blood cultures from 05/13/22 have remained without growth to date. Surgical specimens are pending. Continue broad spectrum coverage pending culture results with vancomycin and piperacillin-tazobactam. Wound care per CVTS.  Remaining medical and supportive care per primary team.   PLAN:  Continue vancomycin and piperacillin-tazobactam. Monitor blood cultures for bacteremia and await surgical specimen gram stain and culture results. Wound care per CVTS.  Remaining medical and supportive care per primary team.   Principal Problem:   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) Active Problems:   COPD with acute exacerbation (HCC)   Type 2 diabetes mellitus (HCC)   PAF (paroxysmal atrial fibrillation) (HCC)   LVAD (left ventricular assist device) present (HCC)   Hyperthyroidism   AKI (acute kidney injury) ([place])    albuterol       Chlorhexidine Gluconate Cloth  6 each Topical Daily   hydrALAZINE  50 mg Oral Q8H   insulin aspart  0-20 Units Subcutaneous TID WC   lactose free nutrition  237 mL Oral TID WC   loratadine  10 mg Oral Daily   methimazole  20 mg Oral BID   metoprolol succinate  25 mg Oral Daily   mupirocin ointment  1 application. Nasal BID   pantoprazole  40 mg Oral Daily   senna-docusate  2 tablet Oral Daily   sertraline  50 mg Oral QHS   sodium chloride flush  3 mL Intravenous Q12H   traZODone  150 mg Oral QHS   zinc sulfate  220 mg Oral Daily    SUBJECTIVE:  Afebrile overnight with no acute events. No new concerns/complaints.   No Known Allergies   Review of Systems: Review of Systems  Constitutional:  Negative for chills, fever and weight loss.  Respiratory:  Negative for cough, shortness of breath and wheezing.   Cardiovascular:  Negative for chest  pain and leg swelling.  Gastrointestinal:  Negative for abdominal pain, constipation, diarrhea, nausea and vomiting.  Skin:  Negative for rash.    OBJECTIVE: Vitals:   05/16/22 0600 05/16/22 0912 05/16/22 0927 05/16/22 0943  BP: 120/85 137/84 124/81 125/87  Pulse: 80 74 71 72  Resp: 19 17 14 12   Temp:  98.6 F (37 C)  97.8 F (36.6 C)  TempSrc:      SpO2: 94% 90% 100% 90%  Weight:      Height:       Body mass index is 24.83 kg/m.  Physical Exam Constitutional:      General: He is not in acute distress.    Appearance: He is well-developed.  Cardiovascular:     Rate and Rhythm: Normal rate and regular rhythm.     Comments: LVAD hum present; PICC site is clean and dry.  Pulmonary:     Effort: Pulmonary effort is normal.     Breath sounds: Normal breath sounds.  Skin:    General: Skin is warm and dry.  Neurological:     Mental Status: He is alert and oriented to person, place, and time.  Psychiatric:        Behavior: Behavior normal.        Thought Content: Thought content normal.        Judgment: Judgment normal.     Lab Results Lab Results  Component  Value Date   WBC 11.4 (H) 05/16/2022   HGB 11.6 (L) 05/16/2022   HCT 34.6 (L) 05/16/2022   MCV 81.4 05/16/2022   PLT 230 05/16/2022    Lab Results  Component Value Date   CREATININE 0.81 05/16/2022   BUN 9 05/16/2022   NA 134 (L) 05/16/2022   K 3.5 05/16/2022   CL 101 05/16/2022   CO2 23 05/16/2022    Lab Results  Component Value Date   ALT 21 05/13/2022   AST 19 05/13/2022   ALKPHOS 111 05/13/2022   BILITOT 0.7 05/13/2022     Microbiology: Recent Results (from the past 240 hour(s))  Aerobic Culture w Gram Stain (superficial specimen)     Status: None   Collection Time: 05/13/22 12:47 PM   Specimen: Abdomen; Wound  Result Value Ref Range Status   Specimen Description ABDOMEN  Final   Special Requests Normal  Final   Gram Stain   Final    NO WBC SEEN RARE GRAM POSITIVE COCCI IN PAIRS RARE GRAM  POSITIVE RODS    Culture   Final    FEW NORMAL SKIN FLORA Performed at [facility], [place] [address] [place], [place] [address]    Report Status 05/15/2022 FINAL  Final  MRSA Next Gen by PCR, Nasal     Status: Abnormal   Collection Time: 05/13/22 12:49 PM   Specimen: Nasal Mucosa; Nasal Swab  Result Value Ref Range Status   MRSA by PCR Next Gen DETECTED (A) NOT DETECTED Final    Comment: RESULT CALLED TO, READ BACK BY AND VERIFIED WITH: Caesar Chestnut @1541  [place] (NOTE) The GeneXpert MRSA Assay (FDA approved for NASAL specimens only), is one component of a comprehensive MRSA colonization surveillance program. It is not intended to diagnose MRSA infection nor to guide or monitor treatment for MRSA infections. Test performance is not FDA approved in patients less than 25 years old. Performed at [facility], [place] [address] [place], [place] [address]   Culture, blood (Routine X 2) w Reflex to ID Panel     Status: None (Preliminary result)   Collection Time: 05/13/22  1:05 PM   Specimen: BLOOD RIGHT HAND  Result Value Ref Range Status   Specimen Description BLOOD RIGHT HAND  Final   Special Requests   Final    BOTTLES DRAWN AEROBIC AND ANAEROBIC Blood Culture adequate volume   Culture   Final    NO GROWTH 2 DAYS Performed at [facility], [place] [address] [place], [place] [address]    Report Status PENDING  Incomplete  Culture, blood (Routine X 2) w Reflex to ID Panel     Status: None (Preliminary result)   Collection Time: 05/13/22  1:06 PM   Specimen: BLOOD  Result Value Ref Range Status   Specimen Description BLOOD LEFT ANTECUBITAL  Final   Special Requests   Final    BOTTLES DRAWN AEROBIC AND ANAEROBIC Blood Culture adequate volume   Culture   Final    NO GROWTH 2 DAYS Performed at [facility], [place] [address] [place],  [address]    Report Status PENDING  Incomplete     Terri Piedra, [facility] for Infectious Disease [facility]  05/16/2022  10:05 AM

## 2022-05-16 NOTE — Progress Notes (Signed)
LVAD Coordinator Rounding Note:  Admitted 05/13/22 to Dr Alford Highland service due to possible sepsis and drive line infection.   HM III LVAD implanted on 11/17/20 by Dr Vickey Sages under destination therapy criteria.   Patient in holding area in bed.  Denies complaints other than exit site and abdominal soreness, but reports it is "better than it was".    Vital signs: Temp: 97.8 HR: 73 Doppler Pressure: 136 Automatic BP: 138/93 (108) O2 Sat: 100% on 2 l/[place] Wt: 173.1>173.0 lbs    LVAD interrogation reveals:  Speed: 5500 Flow: 3.7 Power: 4.2w PI: 6.0 Hct: 37  Alarms: none Events:  30 PI events 05/15/22  Fixed speed: 5500 Low speed limit: 5200   Drive Line:  Gauze dressing C/D/I with drive line anchor intact and accurately applied. Daily wet to dry dressing with Dakins solution per BS nurse. Dr. Alla German will perform 05/17/22 afternoon to demonstrate to [facility] nurse.   Labs:  LDH trend: 132>116>127  INR trend: 2.9>1.9>1.9  WBC: 17.8 (at [facility])>15.6>11.2>10.9>11.4  Infection: 05/13/22>>Blood cultures>> no growth 2 days; final pending 05/13/22>>Drive line culture>>rare gram + cocci in pairs & rare gram + rods; final culture few normal skin flora 05/16/22>>Deep DL wound culture in OR>>pending  Anticoagulation Plan: -INR Goal: 2.0 - 2.5 -ASA Dose: none  Blood Products: 05/14/22>> 2 FFP   Plan/Recommendations:  1. Page VAD coordinator for VAD equipment or drive line issues 2. Daily wet to dry dressings with Dakins solution. Dr. Alla German will change Friday afternoon and demonstrate to [facility] nurse.   Hessie Diener RN VAD Coordinator  Office: [phone]  24/7 Pager: [phone]

## 2022-05-17 ENCOUNTER — Encounter (HOSPITAL_COMMUNITY): Payer: Self-pay | Admitting: Cardiothoracic Surgery

## 2022-05-17 ENCOUNTER — Inpatient Hospital Stay (HOSPITAL_COMMUNITY): Payer: 59

## 2022-05-17 ENCOUNTER — Other Ambulatory Visit (HOSPITAL_COMMUNITY): Payer: Self-pay

## 2022-05-17 DIAGNOSIS — T829XXD Unspecified complication of cardiac and vascular prosthetic device, implant and graft, subsequent encounter: Secondary | ICD-10-CM | POA: Diagnosis not present

## 2022-05-17 DIAGNOSIS — N179 Acute kidney failure, unspecified: Secondary | ICD-10-CM | POA: Diagnosis not present

## 2022-05-17 DIAGNOSIS — T829XXS Unspecified complication of cardiac and vascular prosthetic device, implant and graft, sequela: Secondary | ICD-10-CM

## 2022-05-17 DIAGNOSIS — Z95811 Presence of heart assist device: Secondary | ICD-10-CM | POA: Diagnosis not present

## 2022-05-17 LAB — ACID FAST SMEAR (AFB, MYCOBACTERIA): Acid Fast Smear: NEGATIVE

## 2022-05-17 LAB — GLUCOSE, CAPILLARY
Glucose-Capillary: 216 mg/dL — ABNORMAL HIGH (ref 70–99)
Glucose-Capillary: 219 mg/dL — ABNORMAL HIGH (ref 70–99)
Glucose-Capillary: 218 mg/dL — ABNORMAL HIGH (ref 70–99)
Glucose-Capillary: 144 mg/dL — ABNORMAL HIGH (ref 70–99)

## 2022-05-17 LAB — LACTATE DEHYDROGENASE: LDH: 141 U/L (ref 98–192)

## 2022-05-17 LAB — CBC
HCT: 36.4 % — ABNORMAL LOW (ref 39.0–52.0)
Hemoglobin: 12.1 g/dL — ABNORMAL LOW (ref 13.0–17.0)
MCH: 27.1 pg (ref 26.0–34.0)
MCHC: 33.2 g/dL (ref 30.0–36.0)
MCV: 81.6 fL (ref 80.0–100.0)
Platelets: 267 10*3/uL (ref 150–400)
RBC: 4.46 MIL/uL (ref 4.22–5.81)
RDW: 15.3 % (ref 11.5–15.5)
WBC: 12.8 10*3/uL — ABNORMAL HIGH (ref 4.0–10.5)
nRBC: 0 % (ref 0.0–0.2)

## 2022-05-17 LAB — MAGNESIUM: Magnesium: 2.3 mg/dL (ref 1.7–2.4)

## 2022-05-17 LAB — BASIC METABOLIC PANEL
Anion gap: 10 (ref 5–15)
BUN: 17 mg/dL (ref 8–23)
CO2: 25 mmol/L (ref 22–32)
Calcium: 8.6 mg/dL — ABNORMAL LOW (ref 8.9–10.3)
Chloride: 99 mmol/L (ref 98–111)
Creatinine, Ser: 0.9 mg/dL (ref 0.61–1.24)
GFR, Estimated: >60 mL/min (ref >60)
Glucose, Bld: 216 mg/dL — ABNORMAL HIGH (ref 70–99)
Potassium: 4.3 mmol/L (ref 3.5–5.1)
Sodium: 134 mmol/L — ABNORMAL LOW (ref 135–145)

## 2022-05-17 LAB — BRAIN NATRIURETIC PEPTIDE: B Natriuretic Peptide: 222.1 pg/mL — ABNORMAL HIGH (ref 0.0–100.0)

## 2022-05-17 LAB — PROTIME-INR
INR: 2.3 — ABNORMAL HIGH (ref 0.8–1.2)
Prothrombin Time: 25.3 seconds — ABNORMAL HIGH (ref 11.4–15.2)

## 2022-05-17 MED ORDER — CEFAZOLIN SODIUM-DEXTROSE 2-4 GM/100ML-% IV SOLN
2.0000 g | Freq: Three times a day (TID) | INTRAVENOUS | Status: DC
  Administered 2022-05-17 – 2022-05-19 (×6): 2 g via INTRAVENOUS
  Filled 2022-05-17 (×7): qty 100

## 2022-05-17 MED ORDER — FLUTICASONE PROPIONATE 50 MCG/ACT NA SUSP
1.0000 | Freq: Every day | NASAL | Status: DC
  Administered 2022-05-17 – 2022-06-03 (×17): 1 via NASAL
  Filled 2022-05-17 (×2): qty 16

## 2022-05-17 MED ORDER — METOCLOPRAMIDE HCL 5 MG/ML IJ SOLN
10.0000 mg | Freq: Three times a day (TID) | INTRAMUSCULAR | Status: DC
Start: 2022-05-17 — End: 2022-05-25
  Administered 2022-05-17 – 2022-05-25 (×23): 10 mg via INTRAVENOUS
  Filled 2022-05-17 (×24): qty 2

## 2022-05-17 MED ORDER — GUAIFENESIN ER 600 MG PO TB12
600.0000 mg | ORAL_TABLET | Freq: Two times a day (BID) | ORAL | Status: DC
  Administered 2022-05-17 – 2022-06-03 (×35): 600 mg via ORAL
  Filled 2022-05-17 (×35): qty 1

## 2022-05-17 MED ORDER — HYDRALAZINE HCL 50 MG PO TABS
50.0000 mg | ORAL_TABLET | Freq: Three times a day (TID) | ORAL | Status: DC
  Administered 2022-05-17 – 2022-05-21 (×13): 50 mg via ORAL
  Filled 2022-05-17 (×13): qty 1

## 2022-05-17 MED ORDER — SACUBITRIL-VALSARTAN 24-26 MG PO TABS
1.0000 | ORAL_TABLET | Freq: Two times a day (BID) | ORAL | Status: DC
  Administered 2022-05-17 – 2022-05-19 (×5): 1 via ORAL
  Filled 2022-05-17 (×6): qty 1

## 2022-05-17 MED ORDER — FUROSEMIDE 10 MG/ML IJ SOLN
60.0000 mg | Freq: Once | INTRAMUSCULAR | Status: AC
  Administered 2022-05-17: 60 mg via INTRAVENOUS
  Filled 2022-05-17: qty 6

## 2022-05-17 MED ORDER — SORBITOL 70 % SOLN
30.0000 mL | Freq: Once | Status: AC
  Administered 2022-05-17: 30 mL via ORAL
  Filled 2022-05-17: qty 30

## 2022-05-17 MED ORDER — HYDRALAZINE HCL 50 MG PO TABS: 75.0000 mg | ORAL_TABLET | Freq: Three times a day (TID) | ORAL | Status: DC

## 2022-05-17 MED ORDER — PROCHLORPERAZINE EDISYLATE 10 MG/2ML IJ SOLN
10.0000 mg | Freq: Once | INTRAMUSCULAR | Status: AC
  Administered 2022-05-17: 10 mg via INTRAVENOUS
  Filled 2022-05-17: qty 2

## 2022-05-17 MED ORDER — WARFARIN SODIUM 2 MG PO TABS
2.0000 mg | ORAL_TABLET | Freq: Once | ORAL | Status: AC
  Administered 2022-05-17: 2 mg via ORAL
  Filled 2022-05-17: qty 1

## 2022-05-17 NOTE — TOC Initial Note (Addendum)
Transition of Care [facility]) - Initial/Assessment Note    Patient Details  Name: Kevin Underwood MRN: [id number] Date of Birth: Jan 12, 1961  Transition of [facility]) CM/SW Contact:    [name], RN Phone Number: [phone] 05/17/2022, 3:17 PM  Clinical Narrative:                 HF TOC CM spoke to pt at bedside. States lives at home with wife. Offered choice for [facility] (medicare list with rating given and placed on chart). Pt states agency that will accept coverage. Contacted [facility], Clifton Custard and states he would need to review closer to dc date for staffing. Will need HHRN orders with F2F, OPAT orders for IV abx. Contacted [name], [name] RN is following for IV abx at home.    Contacted Morrie Sheldon, [facility] with referral to review.   Wound vac not needed at this time. Will place [employer] wound vac order on chart. Per Dr Maren Beach, updated.   Expected Discharge Plan: Home w Home Health Services Barriers to Discharge: Continued Medical Work up   Patient Goals and CMS Choice Patient states their goals for this hospitalization and ongoing recovery are:: just want to feel better and go home CMS Medicare.gov Compare Post Acute Care list provided to:: Patient Choice offered to / list presented to : Patient  Expected Discharge Plan and Services Expected Discharge Plan: Home w Home Health Services   Discharge Planning Services: CM Consult Post Acute Care Choice: Home Health Living arrangements for the past 2 months: Single Family Home                                      Prior Living Arrangements/Services Living arrangements for the past 2 months: Single Family Home Lives with:: Spouse Patient language and need for interpreter reviewed:: Yes Do you feel safe going back to the place where you live?: Yes      Need for Family Participation in Patient Care: Yes (Comment) Care giver support system in place?: Yes (comment) Current home services: DME (LVAD,  nebulizer machine) Criminal Activity/Legal Involvement Pertinent to Current Situation/Hospitalization: No - Comment as needed  Activities of Daily Living Home Assistive Devices/Equipment: None ADL Screening (condition at time of admission) Patient's cognitive ability adequate to safely complete daily activities?: Yes Is the patient deaf or have difficulty hearing?: No Does the patient have difficulty seeing, even when wearing glasses/contacts?: No Does the patient have difficulty concentrating, remembering, or making decisions?: No Patient able to express need for assistance with ADLs?: No Does the patient have difficulty dressing or bathing?: No Independently performs ADLs?: Yes (appropriate for developmental age) Does the patient have difficulty walking or climbing stairs?: No Weakness of Legs: None Weakness of Arms/Hands: None  Permission Sought/Granted Permission sought to share information with : Case Manager, Family Supports, PCP Permission granted to share information with : Yes, Verbal Permission Granted  Share Information with NAME: Kevin Underwood  Permission granted to share info w AGENCY: Home Health  Permission granted to share info w Relationship: wife  Permission granted to share info w Contact Information: [phone]  Emotional Assessment Appearance:: Appears stated age Attitude/Demeanor/Rapport: Other (comment) (feeling ill) Affect (typically observed): Accepting Orientation: : Oriented to Self, Oriented to Place, Oriented to  Time, Oriented to Situation   Psych Involvement: No (comment)  Admission diagnosis:  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) [N46.9XXA] Patient Active  Problem List   Diagnosis Date Noted   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) 05/13/2022   AKI (acute kidney injury) (HCC) 05/13/2022   Hyperthyroidism 01/22/2022   Chronic systolic CHF (congestive heart failure) (HCC)    COPD (chronic obstructive  pulmonary disease) (HCC) 09/09/2021   Malnutrition of moderate degree 11/17/2020   CHF (congestive heart failure), NYHA class IV, chronic, systolic (HCC) 11/17/2020   Palliative care by specialist    Goals of care, counseling/discussion    Hyperlipidemia    CHF exacerbation (HCC) 09/23/2020   Unstable angina (HCC) 09/22/2020   LVAD (left ventricular assist device) present (HCC)    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   Cardiogenic shock (HCC)    Acute respiratory failure (HCC)    RUQ abdominal pain    Cardiomyopathy (HCC) 11/21/2019   Acute systolic CHF (congestive heart failure) (HCC) 11/21/2019   History of alcohol abuse Quit 6 mos ago 11/21/2019   History of recreational drug use 11/21/2019   PAF (paroxysmal atrial fibrillation) (HCC) 11/21/2019   Cardiac volume overload 11/21/2019   Elevated brain natriuretic peptide (BNP) level 11/21/2019   Leg edema, right 11/21/2019   Hyponatremia 11/21/2019   Abnormal electrocardiogram (ECG) (EKG) 11/21/2019   Hyperglycemia 11/21/2019   Tremulousness 11/21/2019   Restlessness and agitation 11/21/2019   OSA (obstructive sleep apnea) 11/21/2019   Atrial fibrillation with RVR (HCC) 11/20/2019   Acute congestive heart failure (HCC) 11/20/2019   Acute respiratory distress 11/20/2019   COPD with acute exacerbation (HCC)    Hypertension    Type 2 diabetes mellitus (HCC)    Closed fracture of 5th metacarpal 08/31/2012   PCP:  Kirstie Peri, MD Pharmacy:   [facility] [phone] [facility] [place], [place] - [address] [facility] [address] [place] [address] Phone: [phone] Fax: [phone]  [facility] [address] [place] [address] Phone: [phone] Fax: [phone]     Social Determinants of Health (SDOH) Interventions    Readmission Risk Interventions    11/28/2020   11:57 AM  Readmission Risk Prevention Plan  Transportation Screening Complete  Medication Review (RN Care  Manager) Complete  HRI or Home Care Consult Complete  SW Recovery Care/Counseling Consult Complete  Palliative Care Screening Not Applicable  Skilled Nursing Facility Not Applicable

## 2022-05-17 NOTE — Progress Notes (Addendum)
Patient ID: Kevin Underwood, male   DOB: [birth year], 61 y.o.   MRN: [id number]     Advanced Heart Failure Rounding Note  PCP-Cardiologist: Nona Dell, MD   Subjective:    6/5 Admitted with sepsis/driveline infection. Started on vanc and zosyn . Planning for surgical debridement.  6/7 ID consulted.  6/8 Surgical debridement with deep cultures.   Blood Cx NGTD Surgical Wound Culture 6/8 growing GPC   Remains on Abx. AF. Less abdominal pain today. No dyspnea. MAPs remain elevated 100s.   Back on warfarin. INR 2.3   CT chest/abd/pelvis-->mild fluid collection in the lateral abdominal wall. Marland Kitchen   LVAD Interrogation HM III: Speed: 5500 Flow: 4.3 PI: 3.3 Power: 4.0    Objective:   Weight Range: 78.5 kg Body mass index is 24.83 kg/m.   Vital Signs:   Temp:  [97.8 F (36.6 C)-98.6 F (37 C)] 97.8 F (36.6 C) (06/09 0630) Pulse Rate:  [62-80] 74 (06/09 0630) Resp:  [10-28] 15 (06/09 0630) BP: (86-166)/(74-122) 139/101 (06/09 0600) SpO2:  [88 %-100 %] 95 % (06/09 0630) Last BM Date : 05/13/22  Weight change: Filed Weights   05/14/22 0500  Weight: 78.5 kg    Intake/Output:   Intake/Output Summary (Last 24 hours) at 05/17/2022 0724 Last data filed at 05/17/2022 0618 Gross per 24 hour  Intake 1704.19 ml  Output 1800 ml  Net -95.81 ml      Physical Exam   GENERAL: well appearing, sitting up in bed. No distress  HEENT: normal  NECK: Supple, JVP not elevated  2+ bilaterally, no bruits.  No lymphadenopathy or thyromegaly appreciated.   CARDIAC:  Mechanical heart sounds with LVAD hum present.  LUNGS:  CTAB  ABDOMEN:  Soft, round, nontender, positive bowel sounds x4.     LVAD exit site: Dressing dry and intact.  EXTREMITIES:  Warm and dry, no cyanosis, clubbing, rash or edema .  NEUROLOGIC:  Alert and oriented x 3.    No aphasia.  No dysarthria.    Telemetry   SR w/ PACs 80s   EKG    N/A   Labs    CBC Recent Labs    05/16/22 0058 05/17/22 0153  WBC  11.4* 12.8*  HGB 11.6* 12.1*  HCT 34.6* 36.4*  MCV 81.4 81.6  PLT 230 267   Basic Metabolic Panel Recent Labs    29/79/89 0058 05/17/22 0153  NA 134* 134*  K 3.5 4.3  CL 101 99  CO2 23 25  GLUCOSE 139* 216*  BUN 9 17  CREATININE 0.81 0.90  CALCIUM 8.5* 8.6*  MG 1.5* 2.3   Liver Function Tests No results for input(s): "AST", "ALT", "ALKPHOS", "BILITOT", "PROT", "ALBUMIN" in the last 72 hours.  No results for input(s): "LIPASE", "AMYLASE" in the last 72 hours. Cardiac Enzymes No results for input(s): "CKTOTAL", "CKMB", "CKMBINDEX", "TROPONINI" in the last 72 hours.  BNP: BNP (last 3 results) No results for input(s): "BNP" in the last 8760 hours.  ProBNP (last 3 results) No results for input(s): "PROBNP" in the last 8760 hours.   D-Dimer No results for input(s): "DDIMER" in the last 72 hours. Hemoglobin A1C No results for input(s): "HGBA1C" in the last 72 hours.  Fasting Lipid Panel No results for input(s): "CHOL", "HDL", "LDLCALC", "TRIG", "CHOLHDL", "LDLDIRECT" in the last 72 hours. Thyroid Function Tests No results for input(s): "TSH", "T4TOTAL", "T3FREE", "THYROIDAB" in the last 72 hours.  Invalid input(s): "FREET3"   Other results:   Imaging    No  results found.   Medications:     Scheduled Medications:  Chlorhexidine Gluconate Cloth  6 each Topical Daily   hydrALAZINE  50 mg Oral Q8H   insulin aspart  0-20 Units Subcutaneous TID WC   lactose free nutrition  237 mL Oral TID WC   loratadine  10 mg Oral Daily   methimazole  20 mg Oral BID   metoprolol succinate  25 mg Oral Daily   mupirocin ointment  1 application  Nasal BID   pantoprazole  40 mg Oral Daily   polyethylene glycol  17 g Oral Daily   senna-docusate  2 tablet Oral Daily   sertraline  50 mg Oral QHS   sodium chloride flush  3 mL Intravenous Q12H   traZODone  150 mg Oral QHS   Warfarin - Pharmacist Dosing Inpatient   Does not apply q1600   zinc sulfate  220 mg Oral Daily     Infusions:  sodium chloride Stopped (05/15/22 0604)   sodium chloride 50 mL/hr at 05/16/22 0500   piperacillin-tazobactam (ZOSYN)  IV 3.375 g (05/17/22 0618)   vancomycin Stopped (05/16/22 2045)    PRN Medications: sodium chloride, acetaminophen, fentaNYL (SUBLIMAZE) injection, ipratropium-albuterol, ondansetron (ZOFRAN) IV, oxyCODONE, sodium chloride flush    Patient Profile   Kevin Underwood is a 61 year old with h/o HFrEF, HMIII LVAD, CAD, S/P CABG 2020 with Maze/LAA clipping, DMII, COPD, HTN, PAF, and previous ETOH abuse.    Admitted after syncope/driveline infection.  Assessment/Plan   1. ID: Driveline infection.  CT abdomen/pelvis with small fluid collection along pre-peritoneal driveline.  Early sepsis. Lactate initially elevated.  MAP improved with IV fluid, creatinine lower.  Now afebrile, WBCs 12.8. S/p driveline site debridement 6/8. Surgical wound cultures growing GPC. Blood Cx NGTD.  - Zosyn/vancomycin. He has PICC.  - ID following.  - Dressing changes per Dr Darcey Nora.  2.  Chronic systolic CHF: Echo [id number] with EF 20-25%, mildly decreased RV function. LHC/RHC in 12/21 with patent grafts, low output. Suspect mixed ischemic/nonischemic cardiomyopathy (prior heavy ETOH and drugs as well as CAD).  No ETOH, drugs, smoking since CABG in 12/20. Admitted with cardiogenic shock in 12/21, had placement of Impella 5.5 initially, now s/p Heartmate 3 LVAD on 11/17/20.  Ramp echo 2/23 with speed decreased to 5500 rpm.  NYHA class II, symptomatically had been doing well.  Not volume overloaded on exam. MAP elevated in 100s in setting of pain.  - Restarted coumadin 6/8. ( Per Dr Darcey Nora).   INR 2.3 today  - Continue hydralazine 50 mg three times a day.  - He has not needed Lasix.  3. CAD: S/p CABG 12/20.  LHC pre-VAD with patent grafts, no target for intervention. - Continue statin.  3. Atrial fibrillation: Paroxysmal.  S/p Maze and LA appendage clip with CABG in 12/20.  NS - Off  amiodarone with hyperthyroidism. - Continue coumadin.  5. Type 2 diabetes: SSI.  6. Gout:   - Continue allopurinol 100 mg daily.  7. PACs/PVCs  8. COPD: H/o COPD exacerbation, overall feels better on Trelegy.  - Followup with pulmonary.  9. 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  He missed his endocrinology appt.  - He is now off amiodarone.   - Continue methimazole 20 mg bid, TSH was mildly elevated.   - Will need to reschedule endocrine appt.      Length of Stay: [address] PA-C  05/17/2022, 7:24 AM  Advanced Heart Failure Team Pager [phone] (M-F; 7a - 5p)  Please contact [facility] for night-coverage after hours (5p -7a ) and weekends on [email or web address]  Patient seen with PA, agree with the above note.   Patient had driveline debridement yesterday.  Pain improved this morning.  Still hypertensive.    General: Well appearing this am. NAD.  HEENT: Normal. Neck: Supple, JVP 10-2 cm. Carotids OK.  Cardiac:  Mechanical heart sounds with LVAD hum present.  Lungs:  CTAB, normal effort.  Abdomen:  NT, ND, no HSM. No bruits or masses. +BS  LVAD exit site: Dressed with some drainage.  Extremities:  Warm and dry. No cyanosis, clubbing, rash, or edema.  Neuro:  Alert & oriented x 3. Cranial nerves grossly intact. Moves all 4 extremities w/o difficulty. Affect pleasant    Patient appears volume overloaded now.  Has been getting IV fluid, need to stop this.  Will give Lasix 60 mg IV x 1 and follow response.   MAP remains elevated in setting of pain (though this is improved).  Continue hydralazine and now that creatinine is normalized will add Entresto 24/26 bid.   INR therapeutic, stop heparin.   Remains on vancomycin/Zosyn pending culture data. ID following.    Surgery to change driveline dressing later today.   Mobilize.   Loralie Champagne 05/17/2022 8:13 AM

## 2022-05-17 NOTE — Progress Notes (Signed)
LVAD dressing changed by Dr. Darcey Nora. Dressing to continue to be done daily, and on Monday, June 12th, Dr. Darcey Nora will change it himself. Due to I&D, the following is needed to complete the sterile dressing change, in addition to the LVAD kit:  Individual 4x4 sterile gauze Dakins Solution (bottle in room or must be reordered) Sterile Q Tips Blue sterile basin CHG swabs (in daily kit) Medipore tape    Set up sterile field with standard VAD dressing change using the kit. Remove old dressing and packing (if dressing is too dry use Dakins to loosen gauze). Use blue sterile basin and place 4x4 gauze into basin with Dakins solution. Cleanse surrouding area with CHG swabs from VAD kit. Use individual sterile gauze moistened in Dakins to remove blood that may have pooled from removing old dressing. Using 2 sterile 4x4 gauze pads soaked in Dakins, begin packing with Qtip at furthest right point of driveline where tunneling is present, ending on left side under the driveline. Cover dressing as one would with standard VAD dressing change. To be completed daily by RN or Dr. Darcey Nora.    These instructions given to this RN by Dr. Darcey Nora.

## 2022-05-17 NOTE — TOC Benefit Eligibility Note (Signed)
Patient Product/process development scientist completed.    The patient is currently admitted and upon discharge could be taking Entresto 24-26 mg.  The current 30 day co-pay is, $0.00.   The patient is insured through [employer] part D     Roland Earl, CPhT Pharmacy Patient Advocate Specialist [facility] Patient Advocate Team Direct Number: [phone]  Fax: [phone]

## 2022-05-17 NOTE — TOC Progression Note (Addendum)
Transition of Care [facility]) - Progression Note    Patient Details  Name: Kevin Underwood MRN: [id number] Date of Birth: [birth year]  [facility] CM/SW Contact  Stanislaus Kaltenbach, LCSW Phone Number: 05/17/2022, 4:44 PM  Clinical Narrative:    HF CSW met with Mr. Salemi at bedside to see if any support could be provided. Mr. Hasegawa reported that he doesn't feel very well at all and doesn't need anything right now but to feel better. Mr. Delcastillo did not want to engage further at this time but is aware to reach out if that changes.  CSW will continue to follow throughout discharge.     Expected Discharge Plan: [place] Barriers to Discharge: Continued Medical Work up  Expected Discharge Plan and Services Expected Discharge Plan: [place]   Discharge Planning Services: CM Consult Post Acute Care Choice: [place] arrangements for the past 2 months: Single Family Home                                       Social Determinants of Health (SDOH) Interventions    Readmission Risk Interventions    11/28/2020   11:57 AM  Readmission Risk Prevention Plan  Transportation Screening Complete  Medication Review (RN Care Manager) Complete  HRI or [place] Complete  SW Recovery Care/Counseling Consult Complete  Palliative Care Screening Not [place] Not Applicable

## 2022-05-17 NOTE — Progress Notes (Signed)
[facility]  Date of Admission:  05/13/2022     Total days of antibiotics 5         ASSESSMENT:  Mr. Felch is POD#1 from I&D of LVAD exit site. Cultures are growing Staphylococcus aureus with sensitivities pending. Antibiotics narrowed to vancomycin and cefazolin pending sensitivities of Staphylococcus aureus. Blood cultures remain without growth to date. Post-operative wound care per CVTS with LVAD management and remaining medical and supportive care per primary team.   PLAN:  Continue vancomycin and cefazolin.  Monitor renal function and vancomycin levels.  Monitor cultures for sensitivities and narrow antibiotics as able.  Post-operative wound care per CVTS. Remaining medical and supportive care per primary team.    Principal Problem:   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) Active Problems:   COPD with acute exacerbation (HCC)   Type 2 diabetes mellitus (HCC)   PAF (paroxysmal atrial fibrillation) (HCC)   LVAD (left ventricular assist device) present (HCC)   Hyperthyroidism   AKI (acute kidney injury) (HCC)    Chlorhexidine Gluconate Cloth  6 each Topical Daily   fluticasone  1 spray Each Nare Daily   guaiFENesin  600 mg Oral BID   hydrALAZINE  50 mg Oral Q8H   insulin aspart  0-20 Units Subcutaneous TID WC   lactose free nutrition  237 mL Oral TID WC   loratadine  10 mg Oral Daily   methimazole  20 mg Oral BID   metoCLOPramide (REGLAN) injection  10 mg Intravenous Q8H   metoprolol succinate  25 mg Oral Daily   mupirocin ointment  1 application  Nasal BID   pantoprazole  40 mg Oral Daily   polyethylene glycol  17 g Oral Daily   sacubitril-valsartan  1 tablet Oral BID   senna-docusate  2 tablet Oral Daily   sertraline  50 mg Oral QHS   sodium chloride flush  3 mL Intravenous Q12H   traZODone  150 mg Oral QHS   warfarin  2 mg Oral ONCE-1600   Warfarin - Pharmacist Dosing Inpatient   Does not apply q1600   zinc  sulfate  220 mg Oral Daily    SUBJECTIVE:  Afebrile overnight with no acute events. Culture growing Staphylococcus aureus with sensitivity pending. Not feeling very well today. Overall malaise.   No Known Allergies   Review of Systems: Review of Systems  Constitutional:  Positive for chills and malaise/fatigue. Negative for fever and weight loss.  Respiratory:  Negative for cough, shortness of breath and wheezing.   Cardiovascular:  Negative for chest pain and leg swelling.  Gastrointestinal:  Negative for abdominal pain, constipation, diarrhea, nausea and vomiting.  Skin:  Negative for rash.      OBJECTIVE: Vitals:   05/17/22 1215 05/17/22 1230 05/17/22 1245 05/17/22 1300  BP:    (!) 143/89  Pulse: 71 81 72 74  Resp: 17 (!) 23 15 (!) 23  Temp:      TempSrc:      SpO2: 97% 96% 98% 98%  Weight:      Height:       Body mass index is 24.83 kg/m.  Physical Exam Constitutional:      General: He is not in acute distress.    Appearance: He is well-developed.  Cardiovascular:     Rate and Rhythm: Normal rate and regular rhythm.     Comments: LVAD Hum  Pulmonary:     Effort: Pulmonary effort is normal.     Breath  sounds: Normal breath sounds.  Skin:    General: Skin is warm and dry.  Neurological:     Mental Status: He is alert and oriented to person, place, and time.  Psychiatric:        Behavior: Behavior normal.        Thought Content: Thought content normal.        Judgment: Judgment normal.     Lab Results Lab Results  Component Value Date   WBC 12.8 (H) 05/17/2022   HGB 12.1 (L) 05/17/2022   HCT 36.4 (L) 05/17/2022   MCV 81.6 05/17/2022   PLT 267 05/17/2022    Lab Results  Component Value Date   CREATININE 0.90 05/17/2022   BUN 17 05/17/2022   NA 134 (L) 05/17/2022   K 4.3 05/17/2022   CL 99 05/17/2022   CO2 25 05/17/2022    Lab Results  Component Value Date   ALT 21 05/13/2022   AST 19 05/13/2022   ALKPHOS 111 05/13/2022   BILITOT 0.7  05/13/2022     Microbiology: Recent Results (from the past 240 hour(s))  Aerobic Culture w Gram Stain (superficial specimen)     Status: None   Collection Time: 05/13/22 12:47 PM   Specimen: Abdomen; Wound  Result Value Ref Range Status   Specimen Description ABDOMEN  Final   Special Requests Normal  Final   Gram Stain   Final    NO WBC SEEN RARE GRAM POSITIVE COCCI IN PAIRS RARE GRAM POSITIVE RODS    Culture   Final    FEW NORMAL SKIN FLORA Performed at [facility], [address] [place], [place] [address]    Report Status 05/15/2022 FINAL  Final  MRSA Next Gen by PCR, Nasal     Status: Abnormal   Collection Time: 05/13/22 12:49 PM   Specimen: Nasal Mucosa; Nasal Swab  Result Value Ref Range Status   MRSA by PCR Next Gen DETECTED (A) NOT DETECTED Final    Comment: RESULT CALLED TO, READ BACK BY AND VERIFIED WITH: Dellia Nims @1541  [facility] (NOTE) The GeneXpert MRSA Assay (FDA approved for NASAL specimens only), is one component of a comprehensive MRSA colonization surveillance program. It is not intended to diagnose MRSA infection nor to guide or monitor treatment for MRSA infections. Test performance is not FDA approved in patients less than 32 years old. Performed at [facility], [address] [place], [place] [address]   Culture, blood (Routine X 2) w Reflex to ID Panel     Status: None (Preliminary result)   Collection Time: 05/13/22  1:05 PM   Specimen: BLOOD RIGHT HAND  Result Value Ref Range Status   Specimen Description BLOOD RIGHT HAND  Final   Special Requests   Final    BOTTLES DRAWN AEROBIC AND ANAEROBIC Blood Culture adequate volume   Culture   Final    NO GROWTH 4 DAYS Performed at [facility], [address] [place], [place] [address]    Report Status PENDING  Incomplete  Culture, blood (Routine X 2) w Reflex to ID Panel     Status: None (Preliminary result)   Collection Time: 05/13/22  1:06 PM   Specimen: BLOOD  Result  Value Ref Range Status   Specimen Description BLOOD LEFT ANTECUBITAL  Final   Special Requests   Final    BOTTLES DRAWN AEROBIC AND ANAEROBIC Blood Culture adequate volume   Culture   Final    NO GROWTH  4 DAYS Performed at [facility], [address] [place], [place] [address]    Report Status PENDING  Incomplete  Anaerobic culture w Gram Stain     Status: None (Preliminary result)   Collection Time: 05/16/22  8:50 AM   Specimen: Wound  Result Value Ref Range Status   Specimen Description WOUND  Final   Special Requests ABDOMINAL WOUND  Final   Gram Stain   Final    NO WBC SEEN GRAM POSITIVE COCCI IN PAIRS Performed at [facility], [address] [place], [place] [address]    Culture PENDING  Incomplete   Report Status PENDING  Incomplete  Aerobic Culture w Gram Stain (superficial specimen)     Status: None (Preliminary result)   Collection Time: 05/16/22  8:58 AM   Specimen: Wound  Result Value Ref Range Status   Specimen Description WOUND  Final   Special Requests ABDOMINAL WOUND  Final   Gram Stain NO WBC SEEN RARE GRAM POSITIVE COCCI IN PAIRS   Final   Culture   Final    ABUNDANT STAPHYLOCOCCUS AUREUS SUSCEPTIBILITIES TO FOLLOW Performed at [facility], [address], [place], [place] [address]    Report Status PENDING  Incomplete     Marcos Eke, NP [facility]  05/17/2022  2:47 PM

## 2022-05-17 NOTE — Progress Notes (Signed)
LVAD Coordinator Rounding Note:  Admitted 05/13/22 to Dr Alford Highland service due to possible sepsis and drive line infection.   HM III LVAD implanted on 11/17/20 by Dr Vickey Sages under destination therapy criteria.    6/5 Admitted with sepsis/driveline infection. Started on vanc and zosyn . Planning for surgical debridement.  6/7 ID consulted.  6/8 Surgical debridement with deep cultures.   Pt laying in bed resting upon my arrival. Per bedside RN, pt has had a rough day with nausea, vomiting, and pain. He has received PRN Zofran, Compazine, and Fentanyl with some relief this afternoon. He has been hypertensive today. Entresto restarted.   Pt's wife is at bedside. No questions at this time.  Shortness of breath noted this morning. Received Lasix 60 mg IV x 1 for volume overload. 1.5 liter out so far. Shortness of breath improved post Lasix.   Plan for daily drive line dressing changes using Dakins solution. Dr Donata Clay to change dressing this afternoon and plans to teach bedside RN.   Currently on IV Vancomycin and Zosyn while awaiting wound and blood culture results. Antibiotics per ID.   Vital signs: Temp: 97.7 HR: 74 Doppler Pressure: 100 Automatic BP: 115/99 (115) O2 Sat: 98% on 2 l/[place] Wt: 173.1>173.0 lbs    LVAD interrogation reveals:  Speed: 5500 Flow: 3.8 Power: 4.3 w PI: 6.0 Hct: 37  Alarms: none Events: 40 PI events  Fixed speed: 5500 Low speed limit: 5200   Drive Line:  Gauze dressing C/D/I with drive line anchor intact and accurately applied. Daily wet to dry dressing with Dakins solution per BS nurse. Dr. Alla German will perform 05/17/22 afternoon to demonstrate to [facility] nurse.   Labs:  LDH trend: 132>116>127>141  INR trend: 2.9>1.9>1.9>2.3  WBC: 17.8 (at [facility])>15.6>11.2>10.9>11.4>12.8  Infection: 05/13/22>>Blood cultures>> no growth 4 days; final pending 05/13/22>>Drive line culture>>rare gram + cocci in pairs & rare gram + rods; final culture few normal skin  flora 05/16/22>>Deep DL wound culture in OR>>rare gram + cocci in pairs; abundant staph auerus; final pending 05/16/22>>Fungus cx from OR>> 05/16/22>>AFB cx from OR>>  Anticoagulation Plan: -INR Goal: 2.0 - 2.5 -ASA Dose: none  Blood Products: 05/14/22>> 2 FFP  Drips:  Heparin- stopped  Plan/Recommendations:  1. [place] VAD coordinator for VAD equipment or drive line issues 2. Daily wet to dry dressings with Dakins solution. Dr. Alla German will change Friday afternoon and demonstrate to [facility] nurse.   Alyce Pagan RN VAD Coordinator  Office: [phone]  24/7 Pager: [phone]

## 2022-05-17 NOTE — Progress Notes (Signed)
ANTICOAGULATION CONSULT NOTE   Pharmacy Consult for Warfarin Indication:  LVAD  No Known Allergies  Patient Measurements: Height: 5\' 10"  (177.8 cm) Weight: 78.5 kg (173 lb 1 oz) IBW/kg (Calculated) : 73   Vital Signs: Temp: 97.8 F (36.6 C) (06/09 0630) Temp Source: Oral (06/09 0630) BP: 139/101 (06/09 0600) Pulse Rate: 74 (06/09 0630)  Labs: Recent Labs    05/15/22 0211 05/15/22 1650 05/16/22 0058 05/16/22 0059 05/17/22 0153  HGB 11.3*  --  11.6*  --  12.1*  HCT 33.5*  --  34.6*  --  36.4*  PLT 207  --  230  --  267  LABPROT 21.4*  --  22.0*  --  25.3*  INR 1.9*  --  1.9*  --  2.3*  HEPARINUNFRC  --  <0.10*  --  <0.10*  --   CREATININE 0.80  --  0.81  --  0.90     Estimated Creatinine Clearance: 89 mL/min (by C-G formula based on SCr of 0.9 mg/dL).   Medical History: Past Medical History:  Diagnosis Date   "    Arthritis    CAD (coronary artery disease)    a. s/p CABG in 11/2019 with LIMA-LAD, SVG-OM1, SVG-PDA and SVG-D1   CHF (congestive heart failure) (HCC)    a. EF < 20% by echo in 11/2019   Essential hypertension    PAF (paroxysmal atrial fibrillation) (HCC)    Type 2 diabetes mellitus ([place])     Assessment: 61yom with LVAD HM3 implanted 2021, admitted for drive line infection.  INR 2.6 on admit- no bleeding, h/h stable   S/p debridement and surgery ok to resume warfarin 6/8 with no heparin.   INR up this morning to 2.3, will decrease warfarin dose for tonight. No bleeding issues noted. CBC stable.    PTA warfarin 4mg  TTSS / 6mg  MWF  Goal of Therapy:  INR 2-2.5 Heparin level approx 0.3 Monitor platelets by anticoagulation protocol: Yes   Plan:  Warfarin 2mg  tonight Daily protime, and  cbc   Erin Hearing PharmD., BCPS Clinical Pharmacist 05/17/2022 7:28 AM

## 2022-05-17 NOTE — Progress Notes (Signed)
1 Day Post-Op Procedure(s) (LRB): VAD DRIVELINE DEBRIDEMENT (N/A) Subjective: Patient having some nausea today but no fever.  Wound cultures from OR show abundant Staph aureus, sensitivities pending.  White blood count 12.8.  Patient started back on Coumadin.  INR 2.3.  Would reduce dose of Coumadin tonight due to some oozing in the wound bed during dressing change today.  I performed a wet-to-dry dressing with 4 x 4 gauze and Dakins solution.  This needs to be done with sterile technique daily.  I will change the wound next on Monday a.m.   Objective: Vital signs in last 24 hours: Temp:  [97.7 F (36.5 C)-97.9 F (36.6 C)] 97.7 F (36.5 C) (06/09 1124) Pulse Rate:  [62-84] 76 (06/09 1500) Cardiac Rhythm: Normal sinus rhythm (06/09 0000) Resp:  [11-27] 16 (06/09 1500) BP: (86-158)/(74-110) 99/78 (06/09 1500) SpO2:  [90 %-99 %] 90 % (06/09 1500) FiO2 (%):  [28 %] 28 % (06/09 1124) Weight:  [80.4 kg] 80.4 kg (06/09 1459)  Hemodynamic parameters for last 24 hours: Stable  Intake/Output from previous day: 06/08 0701 - 06/09 0700 In: 1704.2 [P.O.:540; I.V.:573.3; IV Piggyback:590.9] Out: 1800 [Urine:1800] Intake/Output this shift: Total I/O In: 318 [IV Piggyback:318] Out: 2750 [Urine:2750] Exam Alert and responsive Lungs clear Normal VAD hum Abdomen without guarding, tenderness over power cord tunnel continues to slowly improve  Lab Results: Recent Labs    05/16/22 0058 05/17/22 0153  WBC 11.4* 12.8*  HGB 11.6* 12.1*  HCT 34.6* 36.4*  PLT 230 267   BMET:  Recent Labs    05/16/22 0058 05/17/22 0153  NA 134* 134*  K 3.5 4.3  CL 101 99  CO2 23 25  GLUCOSE 139* 216*  BUN 9 17  CREATININE 0.81 0.90  CALCIUM 8.5* 8.6*    PT/INR:  Recent Labs    05/17/22 0153  LABPROT 25.3*  INR 2.3*   ABG    Component Value Date/Time   PHART 7.508 (H) 11/20/2020 0438   HCO3 24.8 09/09/2021 0313   TCO2 29 11/20/2020 0438   ACIDBASEDEF 6.0 (H) 11/24/2019 1718   O2SAT  63.4 09/09/2021 0313   CBG (last 3)  Recent Labs    05/16/22 2154 05/17/22 0631 05/17/22 1135  GLUCAP 270* 216* 219*    Assessment/Plan: S/P Procedure(s) (LRB): VAD DRIVELINE DEBRIDEMENT (N/A) HeartMate 3 power cord tunnel wound repacked showing very early stage of granulation.  It is clean.  It was somewhat bloody.  We will reduce Coumadin dose tonight to 2 mg  ICU nurses will pack the wound daily with Dakin's solution wet-to-dry over the weekend.  I will repack and examine the wound on Monday morning.  Heavy growth of Staph aureus from the operative cultures.  Sensitivities are pending. Continue current antibiotics--vancomycin and Ancef   LOS: 4 days    [name] 05/17/2022

## 2022-05-18 DIAGNOSIS — T827XXA Infection and inflammatory reaction due to other cardiac and vascular devices, implants and grafts, initial encounter: Secondary | ICD-10-CM | POA: Diagnosis not present

## 2022-05-18 DIAGNOSIS — Z95811 Presence of heart assist device: Secondary | ICD-10-CM | POA: Diagnosis not present

## 2022-05-18 DIAGNOSIS — T829XXS Unspecified complication of cardiac and vascular prosthetic device, implant and graft, sequela: Secondary | ICD-10-CM | POA: Diagnosis not present

## 2022-05-18 DIAGNOSIS — N179 Acute kidney failure, unspecified: Secondary | ICD-10-CM | POA: Diagnosis not present

## 2022-05-18 DIAGNOSIS — T829XXD Unspecified complication of cardiac and vascular prosthetic device, implant and graft, subsequent encounter: Secondary | ICD-10-CM | POA: Diagnosis not present

## 2022-05-18 LAB — BASIC METABOLIC PANEL
Anion gap: 11 (ref 5–15)
BUN: 19 mg/dL (ref 8–23)
CO2: 29 mmol/L (ref 22–32)
Calcium: 8.8 mg/dL — ABNORMAL LOW (ref 8.9–10.3)
Chloride: 98 mmol/L (ref 98–111)
Creatinine, Ser: 0.99 mg/dL (ref 0.61–1.24)
GFR, Estimated: >60 mL/min (ref >60)
Glucose, Bld: 155 mg/dL — ABNORMAL HIGH (ref 70–99)
Potassium: 4.1 mmol/L (ref 3.5–5.1)
Sodium: 138 mmol/L (ref 135–145)

## 2022-05-18 LAB — LACTATE DEHYDROGENASE
LDH: 168 U/L (ref 98–192)
LDH: 124 U/L (ref 98–192)

## 2022-05-18 LAB — MAGNESIUM
Magnesium: 2 mg/dL (ref 1.7–2.4)
Magnesium: 1.8 mg/dL (ref 1.7–2.4)

## 2022-05-18 LAB — CBC
HCT: 41.1 % (ref 39.0–52.0)
Hemoglobin: 13 g/dL (ref 13.0–17.0)
MCH: 26.3 pg (ref 26.0–34.0)
MCHC: 31.6 g/dL (ref 30.0–36.0)
MCV: 83.2 fL (ref 80.0–100.0)
Platelets: 304 10*3/uL (ref 150–400)
RBC: 4.94 MIL/uL (ref 4.22–5.81)
RDW: 15.7 % — ABNORMAL HIGH (ref 11.5–15.5)
WBC: 14.3 10*3/uL — ABNORMAL HIGH (ref 4.0–10.5)
nRBC: 0 % (ref 0.0–0.2)

## 2022-05-18 LAB — AEROBIC CULTURE W GRAM STAIN (SUPERFICIAL SPECIMEN): Gram Stain: NONE SEEN

## 2022-05-18 LAB — CULTURE, BLOOD (ROUTINE X 2)
Culture: NO GROWTH
Culture: NO GROWTH
Special Requests: ADEQUATE
Special Requests: ADEQUATE

## 2022-05-18 LAB — GLUCOSE, CAPILLARY
Glucose-Capillary: 296 mg/dL — ABNORMAL HIGH (ref 70–99)
Glucose-Capillary: 262 mg/dL — ABNORMAL HIGH (ref 70–99)
Glucose-Capillary: 100 mg/dL — ABNORMAL HIGH (ref 70–99)

## 2022-05-18 LAB — PROTIME-INR
INR: 2.1 — ABNORMAL HIGH (ref 0.8–1.2)
INR: 2.1 — ABNORMAL HIGH (ref 0.8–1.2)
Prothrombin Time: 23.7 seconds — ABNORMAL HIGH (ref 11.4–15.2)
Prothrombin Time: 23.6 seconds — ABNORMAL HIGH (ref 11.4–15.2)

## 2022-05-18 LAB — VANCOMYCIN, PEAK: Vancomycin Pk: 37 ug/mL (ref 30–40)

## 2022-05-18 MED ORDER — WARFARIN SODIUM 2 MG PO TABS
4.0000 mg | ORAL_TABLET | Freq: Once | ORAL | Status: AC
Start: 2022-05-18 — End: 2022-05-18
  Administered 2022-05-18: 4 mg via ORAL
  Filled 2022-05-18: qty 2

## 2022-05-18 NOTE — Progress Notes (Signed)
[facility] for Infectious Disease    Date of Admission:  05/13/2022   Total days of antibiotics 6           ID: Kevin Underwood is a 61 y.o. 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) Active Problems:   COPD with acute exacerbation (HCC)   Type 2 diabetes mellitus (HCC)   PAF (paroxysmal atrial fibrillation) (HCC)   LVAD (left ventricular assist device) present (HCC)   Hyperthyroidism   AKI (acute kidney injury) ([place])    Subjective: Afebrile, but still having mild tenderness from debridement  Medications:   Chlorhexidine Gluconate Cloth  6 each Topical Daily   fluticasone  1 spray Each Nare Daily   guaiFENesin  600 mg Oral BID   hydrALAZINE  50 mg Oral Q8H   insulin aspart  0-20 Units Subcutaneous TID WC   lactose free nutrition  237 mL Oral TID WC   loratadine  10 mg Oral Daily   methimazole  20 mg Oral BID   metoCLOPramide (REGLAN) injection  10 mg Intravenous Q8H   metoprolol succinate  25 mg Oral Daily   mupirocin ointment  1 application  Nasal BID   pantoprazole  40 mg Oral Daily   polyethylene glycol  17 g Oral Daily   sacubitril-valsartan  1 tablet Oral BID   senna-docusate  2 tablet Oral Daily   sertraline  50 mg Oral QHS   sodium chloride flush  3 mL Intravenous Q12H   traZODone  150 mg Oral QHS   Warfarin - Pharmacist Dosing Inpatient   Does not apply q1600   zinc sulfate  220 mg Oral Daily    Objective: Vital signs in last 24 hours: Temp:  [97.7 F (36.5 C)-98.3 F (36.8 C)] 98.2 F (36.8 C) (06/10 0750) Pulse Rate:  [71-181] 85 (06/10 0100) Resp:  [12-27] 12 (06/10 0837) BP: (70-159)/(57-105) 91/77 (06/10 0800) SpO2:  [89 %-99 %] 93 % (06/10 0100) FiO2 (%):  [28 %] 28 % (06/09 1124) Weight:  [79.8 kg-80.4 kg] 79.8 kg (06/10 0400)  Physical Exam  Constitutional: He is oriented to person, place, and time. He appears well-developed and well-nourished. No distress.  HENT:   Mouth/Throat: Oropharynx is clear and moist. No oropharyngeal exudate.  Cardiovascular: Normal rate, regular rhythm and normal heart sounds. Exam reveals no gallop and no friction rub.  No murmur heard.  Pulmonary/Chest: Effort normal and breath sounds normal. No respiratory distress. He has no wheezes.  Abdominal: Soft. Bowel sounds are normal. He exhibits no distension. There is no tenderness. Dressing intact for driveline. Soaked bandage but not expanded beyond marking Lymphadenopathy:  He has no cervical adenopathy.  Neurological: He is alert and oriented to person, place, and time.  Skin: Skin is warm and dry. No rash noted. No erythema.  Psychiatric: He has a normal mood and affect. His behavior is normal.    Lab Results Recent Labs    05/17/22 0153 05/18/22 0137  WBC 12.8* 14.3*  HGB 12.1* 13.0  HCT 36.4* 41.1  NA 134* 138  K 4.3 4.1  CL 99 98  CO2 25 29  BUN 17 19  CREATININE 0.90 0.99     Microbiology: Sensi pending for staph aureus Studies/Results: DG CHEST PORT 1 VIEW  Result Date: 05/17/2022 CLINICAL DATA:  Shortness of breath EXAM: PORTABLE CHEST 1 VIEW COMPARISON:  Previous studies including the examination of 05/13/2022 FINDINGS: Transverse diameter of heart  is increased. There is a metallic clamp in the region of left atrial appendage. Metallic sutures seen in the sternum. LVAD is noted superimposed over the apex of left ventricle. There are no new infiltrates or signs of pulmonary edema. There is no significant pleural effusion or pneumothorax. Surgical clips are noted in the infraclavicular regions. Old malunited fracture is seen in the left clavicle. IMPRESSION: There are no new infiltrates or signs of pulmonary edema. Electronically Signed   By: Elmer Picker M.D.   On: 05/17/2022 11:42     Assessment/Plan: Staph aureus drive line-LVAD infection = plan to treat with 4 weeks of IV abtx since debridement on 6/8 using 6/9 as day 1 of 28 days.  -recommend  for him to get picc line  Will follow up on sensitivities to narrow to either cefazolin or vanco  Leukocytosis = still elevated at 14K. Will continue to monitor.  [facility] for Infectious Diseases Pager: [phone]  05/18/2022, 10:55 AM

## 2022-05-18 NOTE — Progress Notes (Signed)
[facility] for Warfarin Indication:  LVAD  No Known Allergies  Patient Measurements: Height: 5' 10.5" (179.1 cm) Weight: 79.8 kg (175 lb 14.8 oz) IBW/kg (Calculated) : 74.15   Vital Signs: Temp: 98.2 F (36.8 C) (06/10 0750) Temp Source: Oral (06/10 0750) BP: 99/76 (06/10 0100) Pulse Rate: 85 (06/10 0100)  Labs: Recent Labs    05/15/22 1650 05/16/22 0058 05/16/22 0058 05/16/22 0059 05/17/22 0153 05/18/22 0137  HGB  --  11.6*   < >  --  12.1* 13.0  HCT  --  34.6*  --   --  36.4* 41.1  PLT  --  230  --   --  267 304  LABPROT  --  22.0*  --   --  25.3* 23.7*  INR  --  1.9*  --   --  2.3* 2.1*  HEPARINUNFRC <0.10*  --   --  <0.10*  --   --   CREATININE  --  0.81  --   --  0.90 0.99   < > = values in this interval not displayed.     Estimated Creatinine Clearance: 82.2 mL/min (by C-G formula based on SCr of 0.99 mg/dL).   Medical History: Past Medical History:  Diagnosis Date   "    Arthritis    CAD (coronary artery disease)    a. s/p CABG in 11/2019 with LIMA-LAD, SVG-OM1, SVG-PDA and SVG-D1   CHF (congestive heart failure) (HCC)    a. EF < 20% by echo in 11/2019   Essential hypertension    PAF (paroxysmal atrial fibrillation) (HCC)    Type 2 diabetes mellitus ([place])     Assessment: Kevin Underwood with LVAD HM3 implanted 2021, admitted for drive line infection.  INR 2.6 on admit- no bleeding, h/h stable   S/p debridement and surgery ok to resume warfarin 6/8 with no heparin.   INR down slightly to 2.1, no bleeding issues or oozing from wound site. CBC stable   PTA warfarin 4mg  TTSS / 6mg  MWF  Goal of Therapy:  INR 2-2.5 Heparin level approx 0.3 Monitor platelets by anticoagulation protocol: Yes   Plan:  Warfarin 4mg  tonight Daily INR, CBC  Laurey Arrow, PharmD PGY1 Pharmacy Resident 05/18/2022  8:27 AM  Please check [email or web address] for unit-specific pharmacy phone numbers.

## 2022-05-18 NOTE — Progress Notes (Addendum)
Patient ID: Kevin Underwood, male   DOB: Dec 20, 1960, 61 y.o.   MRN: [id number]     Advanced Heart Failure Rounding Note  PCP-Cardiologist: Rozann Lesches, MD   Subjective:    6/5 Admitted with sepsis/driveline infection. Started on vanc and zosyn . Planning for surgical debridement.  6/7 ID consulted.  6/8 Surgical debridement with deep cultures.   Wound cultures growing S aureus, on vancomycin and cefazolin.   Abdominal pain improved, MAP now 80s-90s. He got 1 dose IV Lasix yesterday and diuresed well, weight down 2 lbs.   Back on warfarin. INR 2.1   CT chest/abd/pelvis-->mild fluid collection in the lateral abdominal wall. Kevin Underwood   LVAD Interrogation HM III: Speed: 5500 Flow: 4.6 PI: 2.3 Power: 4.1   Objective:   Weight Range: 79.8 kg Body mass index is 24.89 kg/m.   Vital Signs:   Temp:  [97.7 F (36.5 C)-98.3 F (36.8 C)] 98.2 F (36.8 C) (06/10 0750) Pulse Rate:  [71-181] 85 (06/10 0100) Resp:  [12-27] 14 (06/10 0300) BP: (70-159)/(57-106) 99/76 (06/10 0100) SpO2:  [89 %-99 %] 93 % (06/10 0100) FiO2 (%):  [28 %] 28 % (06/09 1124) Weight:  [79.8 kg-80.4 kg] 79.8 kg (06/10 0400) Last BM Date : 05/13/22  Weight change: Filed Weights   05/14/22 0500 05/17/22 1459 05/18/22 0400  Weight: 78.5 kg 80.4 kg 79.8 kg    Intake/Output:   Intake/Output Summary (Last 24 hours) at 05/18/2022 0817 Last data filed at 05/18/2022 0100 Gross per 24 hour  Intake 1022.12 ml  Output 2750 ml  Net -1727.88 ml      Physical Exam   General: Well appearing this am. NAD.  HEENT: Normal. Neck: Supple, JVP 7-8 cm. Carotids OK.  Cardiac:  Mechanical heart sounds with LVAD hum present.  Lungs:  CTAB, normal effort.  Abdomen:  NT, ND, no HSM. No bruits or masses. +BS  LVAD exit site: Driveline site dressed Extremities:  Warm and dry. No cyanosis, clubbing, rash, or edema.  Neuro:  Alert & oriented x 3. Cranial nerves grossly intact. Moves all 4 extremities w/o difficulty. Affect  pleasant    Telemetry   SR w/ PACs 80s   EKG    N/A   Labs    CBC Recent Labs    05/17/22 0153 05/18/22 0137  WBC 12.8* 14.3*  HGB 12.1* 13.0  HCT 36.4* 41.1  MCV 81.6 83.2  PLT 267 [id number]   Basic Metabolic Panel Recent Labs    05/17/22 0153 05/18/22 0137  NA 134* 138  K 4.3 4.1  CL 99 98  CO2 25 29  GLUCOSE 216* 155*  BUN 17 19  CREATININE 0.90 0.99  CALCIUM 8.6* 8.8*  MG 2.3 2.0   Liver Function Tests No results for input(s): "AST", "ALT", "ALKPHOS", "BILITOT", "PROT", "ALBUMIN" in the last 72 hours.  No results for input(s): "LIPASE", "AMYLASE" in the last 72 hours. Cardiac Enzymes No results for input(s): "CKTOTAL", "CKMB", "CKMBINDEX", "TROPONINI" in the last 72 hours.  BNP: BNP (last 3 results) Recent Labs    05/17/22 0153  BNP 222.1*    ProBNP (last 3 results) No results for input(s): "PROBNP" in the last 8760 hours.   D-Dimer No results for input(s): "DDIMER" in the last 72 hours. Hemoglobin A1C No results for input(s): "HGBA1C" in the last 72 hours.  Fasting Lipid Panel No results for input(s): "CHOL", "HDL", "LDLCALC", "TRIG", "CHOLHDL", "LDLDIRECT" in the last 72 hours. Thyroid Function Tests No results for input(s): "TSH", "T4TOTAL", "T3FREE", "  THYROIDAB" in the last 72 hours.  Invalid input(s): "FREET3"   Other results:   Imaging    DG CHEST PORT 1 VIEW  Result Date: 05/17/2022 CLINICAL DATA:  Shortness of breath EXAM: PORTABLE CHEST 1 VIEW COMPARISON:  Previous studies including the examination of 05/13/2022 FINDINGS: Transverse diameter of heart is increased. There is a metallic clamp in the region of left atrial appendage. Metallic sutures seen in the sternum. LVAD is noted superimposed over the apex of left ventricle. There are no new infiltrates or signs of pulmonary edema. There is no significant pleural effusion or pneumothorax. Surgical clips are noted in the infraclavicular regions. Old malunited fracture is seen in the  left clavicle. IMPRESSION: There are no new infiltrates or signs of pulmonary edema. Electronically Signed   By: Elmer Picker M.D.   On: 05/17/2022 11:42     Medications:     Scheduled Medications:  Chlorhexidine Gluconate Cloth  6 each Topical Daily   fluticasone  1 spray Each Nare Daily   guaiFENesin  600 mg Oral BID   hydrALAZINE  50 mg Oral Q8H   insulin aspart  0-20 Units Subcutaneous TID WC   lactose free nutrition  237 mL Oral TID WC   loratadine  10 mg Oral Daily   methimazole  20 mg Oral BID   metoCLOPramide (REGLAN) injection  10 mg Intravenous Q8H   metoprolol succinate  25 mg Oral Daily   mupirocin ointment  1 application  Nasal BID   pantoprazole  40 mg Oral Daily   polyethylene glycol  17 g Oral Daily   sacubitril-valsartan  1 tablet Oral BID   senna-docusate  2 tablet Oral Daily   sertraline  50 mg Oral QHS   sodium chloride flush  3 mL Intravenous Q12H   traZODone  150 mg Oral QHS   Warfarin - Pharmacist Dosing Inpatient   Does not apply q1600   zinc sulfate  220 mg Oral Daily    Infusions:  sodium chloride Stopped (05/15/22 0604)    ceFAZolin (ANCEF) IV 2 g (05/18/22 [id number])   vancomycin Stopped (05/17/22 2226)    PRN Medications: sodium chloride, acetaminophen, fentaNYL (SUBLIMAZE) injection, ipratropium-albuterol, ondansetron (ZOFRAN) IV, oxyCODONE, sodium chloride flush    Patient Profile   Kevin Underwood is a 61 year old with h/o HFrEF, HMIII LVAD, CAD, S/P CABG 2020 with Maze/LAA clipping, DMII, COPD, HTN, PAF, and previous ETOH abuse.    Admitted after syncope/driveline infection.  Assessment/Plan   1. ID: Driveline infection.  CT abdomen/pelvis with small fluid collection along pre-peritoneal driveline.  Early sepsis. Lactate initially elevated.  MAP improved with IV fluid, creatinine lower.  Now afebrile, WBCs 14. S/p driveline site debridement 6/8. Surgical wound cultures growing S aureus. Blood Cx NGTD.  - Continue vancomycin/cefazolin  until sensitivities are available.  He has PICC.  - ID following.  - Dr Prescott Gum to re-examine and repack wound on Monday.  ICU nurse to do daily dressing changes this weekend.  2.  Chronic systolic CHF: Echo [id number] with EF 20-25%, mildly decreased RV function. LHC/RHC in 12/21 with patent grafts, low output. Suspect mixed ischemic/nonischemic cardiomyopathy (prior heavy ETOH and drugs as well as CAD).  No ETOH, drugs, smoking since CABG in 12/20. Admitted with cardiogenic shock in 12/21, had placement of Impella 5.5 initially, now s/p Heartmate 3 LVAD on 11/17/20.  Ramp echo 2/23 with speed decreased to 5500 rpm.  NYHA class II, symptomatically had been doing well.  Got IV Lasix  and diuresed well yesterday, not volume overloaded today.  MAP now controlled in 80s-90s on hydralazine and Entresto.  - Not on BP meds at home, suspect he needed due to pain.  Can likely titrate down/off prior to discharge.  - Does not need diuretic today.  - Continue warfarin, INR 2.1.  3. CAD: S/p CABG 12/20.  LHC pre-VAD with patent grafts, no target for intervention. - Continue statin.  3. Atrial fibrillation: Paroxysmal.  S/p Maze and LA appendage clip with CABG in 12/20.  NS - Off amiodarone with hyperthyroidism. - Continue coumadin.  5. Type 2 diabetes: SSI.  6. Gout:   - Continue allopurinol 100 mg daily.  7. PACs/PVCs  8. COPD: H/o COPD exacerbation, overall feels better on Trelegy.  - Followup with pulmonary.  9. 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  He missed his endocrinology appt.  - He is now off amiodarone.   - Continue methimazole 20 mg bid, TSH was mildly elevated.   - Will need to reschedule endocrine appt.      Walk in hall. OK for 2C.   Length of Stay: [place], MD  05/18/2022, 8:17 AM  Advanced Heart Failure Team Pager [phone] (M-F; 7a - 5p)  Please contact [facility] for night-coverage after hours (5p -7a ) and weekends on [email or web address]

## 2022-05-18 NOTE — Plan of Care (Signed)

## 2022-05-19 ENCOUNTER — Inpatient Hospital Stay: Payer: Self-pay

## 2022-05-19 DIAGNOSIS — Z95811 Presence of heart assist device: Secondary | ICD-10-CM

## 2022-05-19 DIAGNOSIS — A4902 Methicillin resistant Staphylococcus aureus infection, unspecified site: Secondary | ICD-10-CM | POA: Diagnosis not present

## 2022-05-19 DIAGNOSIS — T829XXA Unspecified complication of cardiac and vascular prosthetic device, implant and graft, initial encounter: Secondary | ICD-10-CM

## 2022-05-19 DIAGNOSIS — T827XXA Infection and inflammatory reaction due to other cardiac and vascular devices, implants and grafts, initial encounter: Secondary | ICD-10-CM

## 2022-05-19 LAB — CBC
HCT: 39.3 % (ref 39.0–52.0)
Hemoglobin: 13 g/dL (ref 13.0–17.0)
MCH: 26.9 pg (ref 26.0–34.0)
MCHC: 33.1 g/dL (ref 30.0–36.0)
MCV: 81.4 fL (ref 80.0–100.0)
Platelets: 316 10*3/uL (ref 150–400)
RBC: 4.83 MIL/uL (ref 4.22–5.81)
RDW: 15.5 % (ref 11.5–15.5)
WBC: 12.8 10*3/uL — ABNORMAL HIGH (ref 4.0–10.5)
nRBC: 0 % (ref 0.0–0.2)

## 2022-05-19 LAB — PROTIME-INR
INR: 2 — ABNORMAL HIGH (ref 0.8–1.2)
Prothrombin Time: 22.1 seconds — ABNORMAL HIGH (ref 11.4–15.2)

## 2022-05-19 LAB — VANCOMYCIN, TROUGH: Vancomycin Tr: 14 ug/mL — ABNORMAL LOW (ref 15–20)

## 2022-05-19 LAB — GLUCOSE, CAPILLARY
Glucose-Capillary: 215 mg/dL — ABNORMAL HIGH (ref 70–99)
Glucose-Capillary: 267 mg/dL — ABNORMAL HIGH (ref 70–99)
Glucose-Capillary: 173 mg/dL — ABNORMAL HIGH (ref 70–99)
Glucose-Capillary: 239 mg/dL — ABNORMAL HIGH (ref 70–99)

## 2022-05-19 MED ORDER — SODIUM CHLORIDE 0.9% FLUSH
10.0000 mL | Freq: Two times a day (BID) | INTRAVENOUS | Status: DC
  Administered 2022-05-19 – 2022-05-30 (×20): 10 mL
  Administered 2022-05-31: 30 mL
  Administered 2022-05-31 – 2022-06-03 (×6): 10 mL

## 2022-05-19 MED ORDER — WARFARIN SODIUM 4 MG PO TABS
4.0000 mg | ORAL_TABLET | Freq: Once | ORAL | Status: AC
  Administered 2022-05-19: 4 mg via ORAL
  Filled 2022-05-19: qty 1

## 2022-05-19 MED ORDER — HYDRALAZINE HCL 20 MG/ML IJ SOLN: 10.0000 mg | Freq: Once | INTRAMUSCULAR | Status: DC

## 2022-05-19 MED ORDER — SACUBITRIL-VALSARTAN 49-51 MG PO TABS
1.0000 | ORAL_TABLET | Freq: Two times a day (BID) | ORAL | Status: DC
  Administered 2022-05-19 – 2022-05-21 (×5): 1 via ORAL
  Filled 2022-05-19 (×6): qty 1

## 2022-05-19 MED ORDER — SODIUM CHLORIDE 0.9% FLUSH: 10.0000 mL | INTRAVENOUS | Status: DC | PRN

## 2022-05-19 MED ORDER — DAKINS (1/2 STRENGTH) 0.25 % EX SOLN
CUTANEOUS | Status: DC | PRN
  Filled 2022-05-19: qty 473

## 2022-05-19 NOTE — Plan of Care (Signed)
  Problem: Clinical Measurements: Goal: Cardiovascular complication will be avoided Outcome: Progressing   Problem: Nutrition: Goal: Adequate nutrition will be maintained Outcome: Progressing   Problem: Coping: Goal: Level of anxiety will decrease Outcome: Progressing   Problem: Elimination: Goal: Will not experience complications related to urinary retention Outcome: Progressing   Problem: Pain Managment: Goal: General experience of comfort will improve Outcome: Progressing   Problem: Safety: Goal: Ability to remain free from injury will improve Outcome: Progressing   

## 2022-05-19 NOTE — Progress Notes (Signed)
Pt complaining of nausea and vomiting , upon assessment pt is dry heaving to the point of watery eyes. PT has increased blood pressure with map of 123 and increased PI of 7.5.  PRN nausea medication given  LVAD team notified  No changes made at this time

## 2022-05-19 NOTE — Progress Notes (Signed)
Patient ID: Kevin Underwood, male   DOB: 12/26/1960, 61 y.o.   MRN: [id number]     Advanced Heart Failure Rounding Note  PCP-Cardiologist: Nona Dell, MD   Subjective:    6/5 Admitted with sepsis/driveline infection. Started on vanc and zosyn . Planning for surgical debridement.  6/7 ID consulted.  6/8 Surgical debridement with deep cultures. -> Wound cx with MRSA   Seen by ID this am. Will need 4 weeks vancomycin. PICC order placed. No labs yet today   Wound still sore. No fevers or chills. MAPs 80s  CT chest/abd/pelvis-->mild fluid collection in the lateral abdominal wall. Marland Kitchen   LVAD Interrogation HM III: Speed: 5500 Flow: 4.3 PI: 2.5 Power: 4.0  Objective:   Weight Range: 79.5 kg Body mass index is 24.79 kg/m.   Vital Signs:   Temp:  [97.8 F (36.6 C)-98.5 F (36.9 C)] (P) 97.9 F (36.6 C) (06/11 0721) Pulse Rate:  [70-87] (P) 79 (06/11 0721) Resp:  [9-30] (P) 15 (06/11 0721) BP: (79-119)/(60-86) 119/69 (06/11 0828) SpO2:  [92 %-99 %] 95 % (06/11 0438) Weight:  [79.5 kg] 79.5 kg (06/11 0438) Last BM Date : 05/15/22  Weight change: Filed Weights   05/17/22 1459 05/18/22 0400 05/19/22 0438  Weight: 80.4 kg 79.8 kg 79.5 kg    Intake/Output:   Intake/Output Summary (Last 24 hours) at 05/19/2022 1013 Last data filed at 05/19/2022 0625 Gross per 24 hour  Intake 996.02 ml  Output 2125 ml  Net -1128.98 ml       Physical Exam   General:  NAD.  HEENT: normal  Neck: supple. JVP not elevated.  Carotids 2+ bilat; no bruits. No lymphadenopathy or thryomegaly appreciated. Cor: LVAD hum.  Lungs: Clear. Abdomen: obese soft, mildly tender around DL site mildly distended. No hepatosplenomegaly. No bruits or masses. Good bowel sounds. Driveline site with dressing in place Anchor in place.  Extremities: no cyanosis, clubbing, rash. Warm no edema  Neuro: alert & oriented x 3. No focal deficits. Moves all 4 without problem    Telemetry   SR 70-80s Personally  reviewed  Labs    CBC Recent Labs    05/17/22 0153 05/18/22 0137  WBC 12.8* 14.3*  HGB 12.1* 13.0  HCT 36.4* 41.1  MCV 81.6 83.2  PLT 267 304    Basic Metabolic Panel Recent Labs    18/56/31 0153 05/18/22 0137 05/18/22 2150  NA 134* 138  --   K 4.3 4.1  --   CL 99 98  --   CO2 25 29  --   GLUCOSE 216* 155*  --   BUN 17 19  --   CREATININE 0.90 0.99  --   CALCIUM 8.6* 8.8*  --   MG 2.3 2.0 1.8    Liver Function Tests No results for input(s): "AST", "ALT", "ALKPHOS", "BILITOT", "PROT", "ALBUMIN" in the last 72 hours.  No results for input(s): "LIPASE", "AMYLASE" in the last 72 hours. Cardiac Enzymes No results for input(s): "CKTOTAL", "CKMB", "CKMBINDEX", "TROPONINI" in the last 72 hours.  BNP: BNP (last 3 results) Recent Labs    05/17/22 0153  BNP 222.1*     ProBNP (last 3 results) No results for input(s): "PROBNP" in the last 8760 hours.   D-Dimer No results for input(s): "DDIMER" in the last 72 hours. Hemoglobin A1C No results for input(s): "HGBA1C" in the last 72 hours.  Fasting Lipid Panel No results for input(s): "CHOL", "HDL", "LDLCALC", "TRIG", "CHOLHDL", "LDLDIRECT" in the last 72 hours. Thyroid Function  Tests No results for input(s): "TSH", "T4TOTAL", "T3FREE", "THYROIDAB" in the last 72 hours.  Invalid input(s): "FREET3"   Other results:   Imaging    [place] EKG SITE RITE  Result Date: 05/19/2022 If Site Rite image not attached, placement could not be confirmed due to current cardiac rhythm.    Medications:     Scheduled Medications:  Chlorhexidine Gluconate Cloth  6 each Topical Daily   fluticasone  1 spray Each Nare Daily   guaiFENesin  600 mg Oral BID   hydrALAZINE  50 mg Oral Q8H   insulin aspart  0-20 Units Subcutaneous TID WC   lactose free nutrition  237 mL Oral TID WC   loratadine  10 mg Oral Daily   methimazole  20 mg Oral BID   metoCLOPramide (REGLAN) injection  10 mg Intravenous Q8H   metoprolol succinate  25 mg  Oral Daily   mupirocin ointment  1 application  Nasal BID   pantoprazole  40 mg Oral Daily   polyethylene glycol  17 g Oral Daily   sacubitril-valsartan  1 tablet Oral BID   senna-docusate  2 tablet Oral Daily   sertraline  50 mg Oral QHS   sodium chloride flush  3 mL Intravenous Q12H   traZODone  150 mg Oral QHS   Warfarin - Pharmacist Dosing Inpatient   Does not apply q1600   zinc sulfate  220 mg Oral Daily    Infusions:  sodium chloride 10 mL/hr at 05/18/22 1600   vancomycin 1,000 mg (05/19/22 0824)    PRN Medications: sodium chloride, acetaminophen, fentaNYL (SUBLIMAZE) injection, ipratropium-albuterol, ondansetron (ZOFRAN) IV, oxyCODONE, sodium chloride flush    Patient Profile   Kevin Underwood is a 61 year old with h/o HFrEF, HMIII LVAD, CAD, S/P CABG 2020 with Maze/LAA clipping, DMII, COPD, HTN, PAF, and previous ETOH abuse.    Admitted after syncope/driveline infection.  Assessment/Plan   1. ID: Driveline infection.  CT abdomen/pelvis with small fluid collection along pre-peritoneal driveline.  Early sepsis. Lactate initially elevated.  MAP improved with IV fluid, creatinine lower. . S/p driveline site debridement 6/8. Surgical wound cultures growing MRSA  Blood Cx NGTD.  - Now afebrile. WBCs 14 yesterday. No CBC today. Will draw - ID following. Recommend vanc x 28 days. PICC ordered  - Dr Donata Clay to re-examine and repack wound tomorrow  ICU nurse to do daily dressing changes this weekend.  2.  Chronic systolic CHF: Echo 10/21 with EF 20-25%, mildly decreased RV function. LHC/RHC in 12/21 with patent grafts, low output. Suspect mixed ischemic/nonischemic cardiomyopathy (prior heavy ETOH and drugs as well as CAD).  No ETOH, drugs, smoking since CABG in 12/20. Admitted with cardiogenic shock in 12/21, had placement of Impella 5.5 initially, now s/p Heartmate 3 LVAD on 11/17/20.  Ramp echo 2/23 with speed decreased to 5500 rpm.  NYHA class II, symptomatically had been doing  well.  - Volume status ok  - On hydralazine and Entresto for HTN. Not on BP meds at home, suspect he needed due to pain.  Can likely titrate down/off prior to discharge.  - Does not need diuretic today.  - Continue warfarin, INR not drawn -> will draw. Keep on low end of therapeutic with need for possible repeat debridement  - LDH 124.  3. CAD: S/p CABG 12/20.  LHC pre-VAD with patent grafts, no target for intervention. - No s/s angina  - Continue statin.  3. Atrial fibrillation: Paroxysmal.  S/p Maze and LA appendage clip with  CABG in 12/20.  NS - Off amiodarone with hyperthyroidism. - Continue coumadin.  5. Type 2 diabetes: SSI.  6. Gout:   - Continue allopurinol 100 mg daily.  7. PACs/PVCs  8. COPD: H/o COPD exacerbation, overall feels better on Trelegy.  - Followup with pulmonary.  9. 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  He missed his endocrinology appt.  - He is now off amiodarone.   - Continue methimazole 20 mg bid, TSH was mildly elevated.   - Will need to reschedule endocrine appt.       Length of Stay: 6  Arvilla Meres, MD  05/19/2022, 10:13 AM  Advanced Heart Failure Team Pager [phone] (M-F; 7a - 5p)  Please contact [facility] for night-coverage after hours (5p -7a ) and weekends on [email or web address]

## 2022-05-19 NOTE — Progress Notes (Addendum)
[facility] for Infectious Disease    Date of Admission:  05/13/2022   Total days of antibiotics 7           ID: Kevin Underwood is a 61 y.o. 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) Active Problems:   COPD with acute exacerbation (HCC)   Type 2 diabetes mellitus (HCC)   PAF (paroxysmal atrial fibrillation) (HCC)   LVAD (left ventricular assist device) present ([place])   Hyperthyroidism   AKI (acute kidney injury) ([place])   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 (LVAD) (HCC)    Subjective: Feeling poorly today with having boats of nausea and vomiting  Medications:   Chlorhexidine Gluconate Cloth  6 each Topical Daily   fluticasone  1 spray Each Nare Daily   guaiFENesin  600 mg Oral BID   hydrALAZINE  50 mg Oral Q8H   insulin aspart  0-20 Units Subcutaneous TID WC   lactose free nutrition  237 mL Oral TID WC   loratadine  10 mg Oral Daily   methimazole  20 mg Oral BID   metoCLOPramide (REGLAN) injection  10 mg Intravenous Q8H   metoprolol succinate  25 mg Oral Daily   mupirocin ointment  1 application  Nasal BID   pantoprazole  40 mg Oral Daily   polyethylene glycol  17 g Oral Daily   sacubitril-valsartan  1 tablet Oral BID   senna-docusate  2 tablet Oral Daily   sertraline  50 mg Oral QHS   sodium chloride flush  10-40 mL Intracatheter Q12H   sodium chloride flush  3 mL Intravenous Q12H   traZODone  150 mg Oral QHS   Warfarin - Pharmacist Dosing Inpatient   Does not apply q1600   zinc sulfate  220 mg Oral Daily    Objective: Vital signs in last 24 hours: Temp:  [97.8 F (36.6 C)-98.4 F (36.9 C)] 98.1 F (36.7 C) (06/11 1346) Pulse Rate:  [70-87] (P) 79 (06/11 0721) Resp:  [9-20] 20 (06/11 1346) BP: (79-161)/(60-104) 161/104 (06/11 1346) SpO2:  [92 %-99 %] 95 % (06/11 0438) Weight:  [79.5 kg] 79.5 kg (06/11 0438)  Physical Exam   Constitutional: He is oriented to person, place, and time. He appears well-developed and well-nourished. No distress.  HENT: eyes are injected Mouth/Throat: Oropharynx is clear and moist. No oropharyngeal exudate.  Cardiovascular: Normal rate, regular rhythm and normal heart sounds. Exam reveals no gallop and no friction rub.  No murmur heard.  Pulmonary/Chest: Effort normal and breath sounds normal. No respiratory distress. He has no wheezes.  Abdominal: Soft. Bowel sounds are normal. He exhibits no distension. No tenderness. Driveline dressing in place Lymphadenopathy:  He has no cervical adenopathy.  Neurological: He is alert and oriented to person, place, and time.  Skin: Skin is warm and dry. No rash noted. No erythema.  Psychiatric: He has a normal mood and affect. His behavior is normal.    Lab Results Recent Labs    05/17/22 0153 05/18/22 0137 05/19/22 1354  WBC 12.8* 14.3* 12.8*  HGB 12.1* 13.0 13.0  HCT 36.4* 41.1 39.3  NA 134* 138  --   K 4.3 4.1  --   CL 99 98  --   CO2 25 29  --   BUN 17 19  --   CREATININE 0.90 0.99  --      Methicillin resistant staphylococcus aureus  MIC    CIPROFLOXACIN >=8 RESISTANT  Resistant    CLINDAMYCIN <=0.25 SENS... Sensitive    ERYTHROMYCIN >=8 RESISTANT  Resistant    GENTAMICIN <=0.5 SENSI... Sensitive    Inducible Clindamycin NEGATIVE  Sensitive    OXACILLIN >=4 RESISTANT  Resistant    RIFAMPIN <=0.5 SENSI... Sensitive    TETRACYCLINE <=1 SENSITIVE  Sensitive    TRIMETH/SULFA >=320 RESIS... Resistant    VANCOMYCIN <=0.5 SENSI... Sensitive    Studies/Results: [place] EKG SITE RITE  Result Date: 05/19/2022 If Site Rite image not attached, placement could not be confirmed due to current cardiac rhythm.    Assessment/Plan: New onset nausea and vomiting, unclear source = continue with anti-emetic for now and supportive care  MRSA drive line infection = plan to continue with IV vancomycin and discontinue  cefazolin.  [facility] for Infectious Diseases Pager: [phone]  05/19/2022, 2:35 PM  -------------------------------------------------------------  OPAT orders below:   Diagnosis: Drive line infecton  Culture Result: MRSA  No Known Allergies  OPAT Orders Discharge antibiotics to be given via PICC line Discharge antibiotics: Per pharmacy protocol --vanco Aim for Vancomycin trough 15-20 or AUC 400-550 (unless otherwise indicated) Duration: 4 wk  End Date: July 7th  [place] Per Protocol:  Home health RN for IV administration and teaching; PICC line care and labs.    Labs weekly while on IV antibiotics: _x_ CBC with differential _x_ BMP  _x_ CRP every other week _x_ ESR _x_ Vancomycin trough   _x_ Please pull PIC at completion of IV antibiotics __ Please leave PIC in place until doctor has seen patient or been notified  Fax weekly labs to [phone]  Clinic Follow Up Appt:  In 3-4 wk  @ [facility] with dr Clarine Elrod or greg calone

## 2022-05-19 NOTE — Progress Notes (Addendum)
ANTICOAGULATION CONSULT NOTE   Pharmacy Consult for Warfarin Indication:  LVAD  No Known Allergies  Patient Measurements: Height: 5' 10.5" (179.1 cm) Weight: 79.5 kg (175 lb 4.3 oz) IBW/kg (Calculated) : 74.15   Vital Signs: Temp: (P) 97.9 F (36.6 C) (06/11 0721) Temp Source: (P) Oral (06/11 0721) BP: 119/69 (06/11 0828) Pulse Rate: (P) 79 (06/11 0721)  Labs: Recent Labs    05/17/22 0153 05/18/22 0137 05/18/22 2150  HGB 12.1* 13.0  --   HCT 36.4* 41.1  --   PLT 267 304  --   LABPROT 25.3* 23.7* 23.6*  INR 2.3* 2.1* 2.1*  CREATININE 0.90 0.99  --      Estimated Creatinine Clearance: 82.2 mL/min (by C-G formula based on SCr of 0.99 mg/dL).   Medical History: Past Medical History:  Diagnosis Date   "    Arthritis    CAD (coronary artery disease)    a. s/p CABG in 11/2019 with LIMA-LAD, SVG-OM1, SVG-PDA and SVG-D1   CHF (congestive heart failure) (HCC)    a. EF < 20% by echo in 11/2019   Essential hypertension    PAF (paroxysmal atrial fibrillation) (HCC)    Type 2 diabetes mellitus ([place])     Assessment: 61yom with LVAD HM3 implanted 2021, admitted for drive line infection.  INR 2.6 on admit- no bleeding, h/h stable   S/p debridement and surgery ok to resume warfarin 6/8 with no heparin.   INR 2.0 today - keeping on low end of goal with possible I&D in future.  No bleeding issues or oozing from wound site. CBC stable.   PTA warfarin 4mg  TTSS / 6mg  MWF  Goal of Therapy:  INR 2-2.5 Heparin level approx 0.3 Monitor platelets by anticoagulation protocol: Yes   Plan:  Warfarin 4mg  tonight Daily INR, CBC  Laurey Arrow, PharmD PGY1 Pharmacy Resident 05/19/2022  1:30 PM  Please check [email or web address] for unit-specific pharmacy phone numbers.

## 2022-05-19 NOTE — Progress Notes (Signed)
CBG not crossing over- [id number]

## 2022-05-19 NOTE — Progress Notes (Signed)
Pt now resting in bed Pressures remaining high with map of 118 after hydralazine given with PI 8.3  Pt doppler rechecked with reading of 160 verified by second RN  Pt no longer vomiting but still doesn't "feel well" LVAD team notified

## 2022-05-19 NOTE — Progress Notes (Signed)
Peripherally Inserted Central Catheter Placement  The IV Nurse has discussed with the patient and/or persons authorized to consent for the patient, the purpose of this procedure and the potential benefits and risks involved with this procedure.  The benefits include less needle sticks, lab draws from the catheter, and the patient may be discharged home with the catheter. Risks include, but not limited to, infection, bleeding, blood clot (thrombus formation), and puncture of an artery; nerve damage and irregular heartbeat and possibility to perform a PICC exchange if needed/ordered by physician.  Alternatives to this procedure were also discussed.  Bard Power PICC patient education guide, fact sheet on infection prevention and patient information card has been provided to patient /or left at bedside.    PICC Placement Documentation  PICC Single Lumen 05/19/22 Right Basilic 40 cm 0 cm (Active)  Indication for Insertion or Continuance of Line Prolonged intravenous therapies 05/19/22 1233  Exposed Catheter (cm) 0 cm 05/19/22 1233  Site Assessment Clean, Dry, Intact 05/19/22 1233  Line Status Saline locked;Flushed;Blood return noted 05/19/22 1233  Dressing Type Securing device;Transparent 05/19/22 1233  Dressing Status Antimicrobial disc in place;Clean, Dry, Intact 05/19/22 1233  Safety Lock Not Applicable 05/19/22 1233  Line Care Connections checked and tightened 05/19/22 1233  Line Adjustment (NICU/IV Team Only) No 05/19/22 1233  Dressing Intervention New dressing 05/19/22 1233  Dressing Change Due 05/26/22 05/19/22 1233       Kevin Underwood 05/19/2022, 12:34 PM

## 2022-05-20 ENCOUNTER — Other Ambulatory Visit: Payer: Self-pay

## 2022-05-20 DIAGNOSIS — T829XXA Unspecified complication of cardiac and vascular prosthetic device, implant and graft, initial encounter: Secondary | ICD-10-CM | POA: Diagnosis not present

## 2022-05-20 DIAGNOSIS — T827XXA Infection and inflammatory reaction due to other cardiac and vascular devices, implants and grafts, initial encounter: Secondary | ICD-10-CM | POA: Diagnosis not present

## 2022-05-20 DIAGNOSIS — A4902 Methicillin resistant Staphylococcus aureus infection, unspecified site: Secondary | ICD-10-CM | POA: Diagnosis not present

## 2022-05-20 DIAGNOSIS — Z95811 Presence of heart assist device: Secondary | ICD-10-CM | POA: Diagnosis not present

## 2022-05-20 DIAGNOSIS — T829XXS Unspecified complication of cardiac and vascular prosthetic device, implant and graft, sequela: Secondary | ICD-10-CM | POA: Diagnosis not present

## 2022-05-20 LAB — MAGNESIUM: Magnesium: 1.8 mg/dL (ref 1.7–2.4)

## 2022-05-20 LAB — CBC
HCT: 35.9 % — ABNORMAL LOW (ref 39.0–52.0)
Hemoglobin: 11.7 g/dL — ABNORMAL LOW (ref 13.0–17.0)
MCH: 26.8 pg (ref 26.0–34.0)
MCHC: 32.6 g/dL (ref 30.0–36.0)
MCV: 82.2 fL (ref 80.0–100.0)
Platelets: 299 10*3/uL (ref 150–400)
RBC: 4.37 MIL/uL (ref 4.22–5.81)
RDW: 15.5 % (ref 11.5–15.5)
WBC: 11.8 10*3/uL — ABNORMAL HIGH (ref 4.0–10.5)
nRBC: 0 % (ref 0.0–0.2)

## 2022-05-20 LAB — BASIC METABOLIC PANEL
Anion gap: 10 (ref 5–15)
BUN: 16 mg/dL (ref 8–23)
CO2: 29 mmol/L (ref 22–32)
Calcium: 8.6 mg/dL — ABNORMAL LOW (ref 8.9–10.3)
Chloride: 98 mmol/L (ref 98–111)
Creatinine, Ser: 0.91 mg/dL (ref 0.61–1.24)
GFR, Estimated: >60 mL/min (ref >60)
Glucose, Bld: 171 mg/dL — ABNORMAL HIGH (ref 70–99)
Potassium: 3.6 mmol/L (ref 3.5–5.1)
Sodium: 137 mmol/L (ref 135–145)

## 2022-05-20 LAB — GLUCOSE, CAPILLARY
Glucose-Capillary: 181 mg/dL — ABNORMAL HIGH (ref 70–99)
Glucose-Capillary: 164 mg/dL — ABNORMAL HIGH (ref 70–99)
Glucose-Capillary: 244 mg/dL — ABNORMAL HIGH (ref 70–99)
Glucose-Capillary: 317 mg/dL — ABNORMAL HIGH (ref 70–99)
Glucose-Capillary: 108 mg/dL — ABNORMAL HIGH (ref 70–99)

## 2022-05-20 LAB — PROTIME-INR
INR: 2.3 — ABNORMAL HIGH (ref 0.8–1.2)
Prothrombin Time: 24.8 seconds — ABNORMAL HIGH (ref 11.4–15.2)

## 2022-05-20 LAB — LACTATE DEHYDROGENASE: LDH: 117 U/L (ref 98–192)

## 2022-05-20 MED ORDER — WARFARIN SODIUM 2 MG PO TABS
2.0000 mg | ORAL_TABLET | Freq: Once | ORAL | Status: AC
  Administered 2022-05-20: 2 mg via ORAL
  Filled 2022-05-20: qty 1

## 2022-05-20 MED ORDER — DOCUSATE SODIUM 100 MG PO CAPS
100.0000 mg | ORAL_CAPSULE | Freq: Every day | ORAL | Status: DC
  Administered 2022-05-20 – 2022-06-02 (×13): 100 mg via ORAL
  Filled 2022-05-20 (×15): qty 1

## 2022-05-20 MED ORDER — SORBITOL 70 % SOLN
60.0000 mL | Freq: Once | Status: AC
Start: 2022-05-20 — End: 2022-05-20
  Administered 2022-05-20: 60 mL via ORAL
  Filled 2022-05-20: qty 60

## 2022-05-20 MED ORDER — POLYETHYLENE GLYCOL 3350 17 G PO PACK
17.0000 g | PACK | Freq: Two times a day (BID) | ORAL | Status: DC
Start: 2022-05-20 — End: 2022-06-03
  Administered 2022-05-20 – 2022-06-01 (×9): 17 g via ORAL
  Filled 2022-05-20 (×25): qty 1

## 2022-05-20 NOTE — Progress Notes (Signed)
ANTICOAGULATION CONSULT NOTE   Pharmacy Consult for Warfarin Indication:  LVAD  No Known Allergies  Patient Measurements: Height: 5' 10.5" (179.1 cm) Weight: 79.5 kg (175 lb 4.3 oz) IBW/kg (Calculated) : 74.15   Vital Signs: Temp: 97.9 F (36.6 C) (06/12 0540) Temp Source: Oral (06/12 0540) BP: 112/73 (06/12 0540) Pulse Rate: 77 (06/12 0540)  Labs: Recent Labs    05/18/22 0137 05/18/22 2150 05/19/22 1354 05/20/22 0545  HGB 13.0  --  13.0 11.7*  HCT 41.1  --  39.3 35.9*  PLT 304  --  316 299  LABPROT 23.7* 23.6* 22.1* 24.8*  INR 2.1* 2.1* 2.0* 2.3*  CREATININE 0.99  --   --  0.91     Estimated Creatinine Clearance: 89.5 mL/min (by C-G formula based on SCr of 0.91 mg/dL).   Medical History: Past Medical History:  Diagnosis Date   "    Arthritis    CAD (coronary artery disease)    a. s/p CABG in 11/2019 with LIMA-LAD, SVG-OM1, SVG-PDA and SVG-D1   CHF (congestive heart failure) (HCC)    a. EF < 20% by echo in 11/2019   Essential hypertension    PAF (paroxysmal atrial fibrillation) (HCC)    Type 2 diabetes mellitus ([place])     Assessment: 61yom with LVAD HM3 implanted 2021, admitted for drive line infection.  INR 2.6 on admit- no bleeding, h/h stable   S/p debridement and surgery ok to resume warfarin 6/8 with no heparin.   INR 2.3 today - keeping on low end of goal with possible I&D in future.  No bleeding issues or oozing from wound site. CBC stable.   PTA warfarin 4mg  TTSS / 6mg  MWF  Goal of Therapy:  INR 2-2.5 Monitor platelets by anticoagulation protocol: Yes   Plan:  Warfarin 4mg  tonight Daily INR  Erin Hearing PharmD., BCPS Clinical Pharmacist 05/20/2022 7:59 AM  Please check [email or web address] for unit-specific pharmacy phone numbers.

## 2022-05-20 NOTE — Progress Notes (Signed)
LVAD Coordinator Rounding Note:  Admitted 05/13/22 to Dr Alford Highland service due to possible sepsis and drive line infection.   HM III LVAD implanted on 11/17/20 by Dr Vickey Sages under destination therapy criteria.    6/5 Admitted with sepsis/driveline infection. Started on vanc and zosyn . Planning for surgical debridement.  6/7 ID consulted.  6/8 Surgical debridement with deep cultures.   Pt laying in bed upon my arrival. Pt had a rough weekend with nausea, vomiting, and pain. He has received PRN Zofran, Compazine, and Fentanyl with some relief. He has been hypertensive. Entresto increased over the weekend.   Pt's wife is at bedside. She observed DR Zenaida Niece Trigt change dressing today. Pt premedicated with oxycodone by bedside nurse.   Plan for daily drive line dressing changes using Dakins solution. Dr Donata Clay changed dressing this morning. See full note below.   Currently on IV Vancomycin and Zosyn while awaiting wound and blood culture results. Antibiotics per ID.   Per DR Donata Clay pt will need to remain in hospital this week until his driveline looks  better.   Vital signs: Temp: 98.1 HR: 85 Doppler Pressure: 84 Automatic BP: 109/68 (82) O2 Sat: 94% on RA Wt: 173.1>173.0>175.2 lbs    LVAD interrogation reveals:  Speed: 5500 Flow: 4.5 Power: 4 w PI: 2.7 Hct: 36  Alarms: none Events: 65+ yesterday; 15 today  Fixed speed: 5500 Low speed limit: 5200   Drive Line: Existing VAD dressing removed and site care performed using sterile technique by Dr Donata Clay. Drive line exit site cleaned with Chlora prep applicators x 2, allowed to dry, packed with 2 Dakins moistened 4 x 4 and covered with several 4 x 4. Dr Zenaida Niece Trigt debride wound today. Scant bloody drainage. See picture below. No redness, tenderness, foul odor or rash noted. Drive line anchor re-applied. Dr Donata Clay will change dressing again tomorrow at 2:30 at bedside.     Labs:  LDH trend: 132>116>127>141>117  INR trend:  2.9>1.9>1.9>2.3  WBC: 17.8 (at [facility])>15.6>11.2>10.9>11.4>12.8>11.8  Infection: 05/13/22>>Blood cultures>> no growth 4 days; final pending 05/13/22>>Drive line culture>>rare gram + cocci in pairs & rare gram + rods; final culture few normal skin flora 05/16/22>>Deep DL wound culture in OR>>rare gram + cocci in pairs; abundant staph auerus; final pending 05/16/22>>Fungus cx from OR>> 05/16/22>>AFB cx from OR>>  Anticoagulation Plan: -INR Goal: 2.0 - 2.5 -ASA Dose: none  Blood Products: 05/14/22>> 2 FFP  Drips:  Heparin- stopped  Plan/Recommendations:  1. Page VAD coordinator for VAD equipment or drive line issues 2. Daily wet to dry dressings with Dakins solution. Dr. Alla German will change dressing again tomorrow around 1430, pt will need premedication.  Carlton Adam RN VAD Coordinator  Office: [phone]  24/7 Pager: [phone]

## 2022-05-20 NOTE — Progress Notes (Signed)
PHARMACY CONSULT NOTE FOR:  OUTPATIENT  PARENTERAL ANTIBIOTIC THERAPY (OPAT)  Indication: MRSA LVAD Driveline Infection Regimen: Vancomycin 1g IV every 12 hours End date: 06/20/22  IV antibiotic discharge orders are pended. To discharging provider:  please sign these orders via discharge navigator,  Select New Orders & click on the button choice - Manage This Unsigned Work.     Thank you for allowing pharmacy to be a part of this patient's care.  Georgina Pillion, PharmD, BCPS Infectious Diseases Clinical Pharmacist 05/20/2022 7:36 AM   **Pharmacist phone directory can now be found on [email or web address] ([email or web address]).  Listed under [facility].

## 2022-05-20 NOTE — Progress Notes (Signed)
4 Days Post-Op Procedure(s) (LRB): VAD DRIVELINE DEBRIDEMENT (N/A) Subjective: Abd wall tenderness improved, nausea better as well Wound personally inspected, debrided and repacked- looks clean but no granulation Will transition from Dakins solution to saline wet/dry tomorrow  Objective: Vital signs in last 24 hours: Temp:  [97.8 F (36.6 C)-98.6 F (37 C)] 98.1 F (36.7 C) (06/12 0801) Pulse Rate:  [77-90] 90 (06/12 0801) Cardiac Rhythm: Normal sinus rhythm (06/12 0700) Resp:  [14-20] 14 (06/12 0540) BP: (109-171)/(68-104) 109/68 (06/12 0801) SpO2:  [92 %-94 %] 94 % (06/12 0801) Weight:  [79.5 kg] 79.5 kg (06/12 0540)  Hemodynamic parameters for last 24 hours:  stable  Intake/Output from previous day: 06/11 0701 - 06/12 0700 In: 0  Out: 500 [Urine:500] Intake/Output this shift: No intake/output data recorded.  Normal VAD hum Nsr Abd soft  Lab Results: Recent Labs    05/19/22 1354 05/20/22 0545  WBC 12.8* 11.8*  HGB 13.0 11.7*  HCT 39.3 35.9*  PLT 316 299   BMET:  Recent Labs    05/18/22 0137 05/20/22 0545  NA 138 137  K 4.1 3.6  CL 98 98  CO2 29 29  GLUCOSE 155* 171*  BUN 19 16  CREATININE 0.99 0.91  CALCIUM 8.8* 8.6*    PT/INR:  Recent Labs    05/20/22 0545  LABPROT 24.8*  INR 2.3*   ABG    Component Value Date/Time   PHART 7.508 (H) 11/20/2020 0438   HCO3 24.8 09/09/2021 0313   TCO2 29 11/20/2020 0438   ACIDBASEDEF 6.0 (H) 11/24/2019 1718   O2SAT 63.4 09/09/2021 0313   CBG (last 3)  Recent Labs    05/19/22 1458 05/19/22 2124 05/20/22 0603  GLUCAP 173* 239* 164*    Assessment/Plan: S/P Procedure(s) (LRB): VAD DRIVELINE DEBRIDEMENT (N/A) Daily dressing changes, iv Vanc Needs granulation base started before he goes home with wound care by wife  LOS: 7 days    [name] 05/20/2022

## 2022-05-20 NOTE — Progress Notes (Addendum)
Patient ID: Kevin Underwood, male   DOB: 05/24/1961, 61 y.o.   MRN: [id number]     Advanced Heart Failure Rounding Note  PCP-Cardiologist: Nona Dell, MD   Subjective:    6/5 Admitted with sepsis/driveline infection. Started on vanc and zosyn . Planning for surgical debridement.  6/7 ID consulted.  6/8 Surgical debridement with deep cultures. -> Wound cx with MRSA  ID recommending IV Vanc until 7/7. PICC placed.   Denies SOB. No BM in 5 days.   LVAD Interrogation HM III: Speed: 5500 Flow: 4.4 PI: 2.7 Power: 4   Objective:   Weight Range: 79.5 kg Body mass index is 24.79 kg/m.   Vital Signs:   Temp:  [97.8 F (36.6 C)-98.6 F (37 C)] 97.9 F (36.6 C) (06/12 0540) Pulse Rate:  [77-82] 77 (06/12 0540) Resp:  [14-20] 14 (06/12 0540) BP: (110-171)/(69-104) 112/73 (06/12 0540) SpO2:  [92 %-94 %] 92 % (06/12 0540) Last BM Date : 05/15/22  Weight change: Filed Weights   05/17/22 1459 05/18/22 0400 05/19/22 0438  Weight: 80.4 kg 79.8 kg 79.5 kg    Intake/Output:   Intake/Output Summary (Last 24 hours) at 05/20/2022 0715 Last data filed at 05/20/2022 0500 Gross per 24 hour  Intake 0 ml  Output 500 ml  Net -500 ml    Maps 80s   Physical Exam  Physical Exam: GENERAL: No acute distress. HEENT: normal  NECK: Supple, JVP flat  .  2+ bilaterally, no bruits.  No lymphadenopathy or thyromegaly appreciated.   CARDIAC:  Mechanical heart sounds with LVAD hum present.  LUNGS:  Clear to auscultation bilaterally.  ABDOMEN:  Soft, round, nontender, positive bowel sounds x4.     LVAD exit site:  Dressing dry and intact.  No erythema or drainage.  Stabilization device present and accurately applied.   EXTREMITIES:  Warm and dry, no cyanosis, clubbing, rash or edema . RUE PICC NEUROLOGIC:  Alert and oriented x 3.    No aphasia.  No dysarthria.  Affect pleasant.      Telemetry   SR 70-80s  Labs    CBC Recent Labs    05/19/22 1354 05/20/22 0545  WBC 12.8* 11.8*  HGB  13.0 11.7*  HCT 39.3 35.9*  MCV 81.4 82.2  PLT 316 299   Basic Metabolic Panel Recent Labs    50/03/70 0137 05/18/22 2150 05/20/22 0545  NA 138  --  137  K 4.1  --  3.6  CL 98  --  98  CO2 29  --  29  GLUCOSE 155*  --  171*  BUN 19  --  16  CREATININE 0.99  --  0.91  CALCIUM 8.8*  --  8.6*  MG 2.0 1.8 1.8   Liver Function Tests No results for input(s): "AST", "ALT", "ALKPHOS", "BILITOT", "PROT", "ALBUMIN" in the last 72 hours.  No results for input(s): "LIPASE", "AMYLASE" in the last 72 hours. Cardiac Enzymes No results for input(s): "CKTOTAL", "CKMB", "CKMBINDEX", "TROPONINI" in the last 72 hours.  BNP: BNP (last 3 results) Recent Labs    05/17/22 0153  BNP 222.1*    ProBNP (last 3 results) No results for input(s): "PROBNP" in the last 8760 hours.   D-Dimer No results for input(s): "DDIMER" in the last 72 hours. Hemoglobin A1C No results for input(s): "HGBA1C" in the last 72 hours.  Fasting Lipid Panel No results for input(s): "CHOL", "HDL", "LDLCALC", "TRIG", "CHOLHDL", "LDLDIRECT" in the last 72 hours. Thyroid Function Tests No results for input(s): "  TSH", "T4TOTAL", "T3FREE", "THYROIDAB" in the last 72 hours.  Invalid input(s): "FREET3"   Other results:   Imaging    [place] EKG SITE RITE  Result Date: 05/19/2022 If Site Rite image not attached, placement could not be confirmed due to current cardiac rhythm.    Medications:     Scheduled Medications:  Chlorhexidine Gluconate Cloth  6 each Topical Daily   fluticasone  1 spray Each Nare Daily   guaiFENesin  600 mg Oral BID   hydrALAZINE  10 mg Intravenous Once   hydrALAZINE  50 mg Oral Q8H   insulin aspart  0-20 Units Subcutaneous TID WC   lactose free nutrition  237 mL Oral TID WC   loratadine  10 mg Oral Daily   methimazole  20 mg Oral BID   metoCLOPramide (REGLAN) injection  10 mg Intravenous Q8H   metoprolol succinate  25 mg Oral Daily   pantoprazole  40 mg Oral Daily   polyethylene  glycol  17 g Oral Daily   sacubitril-valsartan  1 tablet Oral BID   senna-docusate  2 tablet Oral Daily   sertraline  50 mg Oral QHS   sodium chloride flush  10-40 mL Intracatheter Q12H   sodium chloride flush  3 mL Intravenous Q12H   traZODone  150 mg Oral QHS   Warfarin - Pharmacist Dosing Inpatient   Does not apply q1600   zinc sulfate  220 mg Oral Daily    Infusions:  sodium chloride 10 mL/hr at 05/18/22 1600   vancomycin 1,000 mg (05/19/22 2022)    PRN Medications: sodium chloride, acetaminophen, fentaNYL (SUBLIMAZE) injection, ipratropium-albuterol, ondansetron (ZOFRAN) IV, oxyCODONE, sodium chloride flush, sodium chloride flush, sodium hypochlorite    Patient Profile   Kevin Underwood is a 61 year old with h/o HFrEF, HMIII LVAD, CAD, S/P CABG 2020 with Maze/LAA clipping, DMII, COPD, HTN, PAF, and previous ETOH abuse.    Admitted after syncope/driveline infection.  Assessment/Plan   1. ID: Driveline infection.  CT abdomen/pelvis with small fluid collection along pre-peritoneal driveline.  Early sepsis. Lactate initially elevated.  MAP improved with IV fluid, creatinine lower. . S/p driveline site debridement 6/8. Surgical wound cultures growing MRSA  Blood Cx NGTD.  - Now afebrile. WBCs 11.8 today.  - ID following. Recommend vanc with completion day July 7th. OPAT orders placed. PICC placed - HH orders placed.   - Dr Donata Clay to re-examine and repack wound today.   - TOC CM following for home antibiotics.  2.  Chronic systolic CHF: Echo 10/21 with EF 20-25%, mildly decreased RV function. LHC/RHC in 12/21 with patent grafts, low output. Suspect mixed ischemic/nonischemic cardiomyopathy (prior heavy ETOH and drugs as well as CAD).  No ETOH, drugs, smoking since CABG in 12/20. Admitted with cardiogenic shock in 12/21, had placement of Impella 5.5 initially, now s/p Heartmate 3 LVAD on 11/17/20.  Ramp echo 2/23 with speed decreased to 5500 rpm.  NYHA class II, symptomatically had  been doing well.  - Volume status ok  - On hydralazine and Entresto for HTN.  - Does not need diuretic today.  - Continue warfarin, INR 2.3  Keep on low end of therapeutic with need for possible repeat debridement  - LDH 117 3. CAD: S/p CABG 12/20.  LHC pre-VAD with patent grafts, no target for intervention. - No s/s angina  - Continue statin.  3. Atrial fibrillation: Paroxysmal.  S/p Maze and LA appendage clip with CABG in 12/20.  NS - Off amiodarone with hyperthyroidism. -  Continue coumadin.  5. Type 2 diabetes: SSI.  6. Gout:   - Continue allopurinol 100 mg daily.  7. PACs/PVCs  8. COPD: H/o COPD exacerbation, overall feels better on Trelegy.  - Followup with pulmonary.  9. 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  He missed his endocrinology appt.  - He is now off amiodarone.   - Continue methimazole 20 mg bid, TSH was mildly elevated.   - Will need to reschedule endocrine appt.    Ambulated.     TOC consulted for home antibiotics.   Length of Stay: 7  Amy Clegg, NP  05/20/2022, 7:15 AM  Advanced Heart Failure Team Pager [phone] (M-F; 7a - 5p)  Please contact [facility] for night-coverage after hours (5p -7a ) and weekends on [email or web address]  Patient seen and examined with the above-signed Advanced Practice Provider and/or Housestaff. I personally reviewed laboratory data, imaging studies and relevant notes. I independently examined the patient and formulated the important aspects of the plan. I have edited the note to reflect any of my changes or salient points. I have personally discussed the plan with the patient and/or family.  Given IV hydralazine and Entresto increased yesterday for elevated MAPs. MAP improved this am. Feels bloated. No BM. Remains afebrile on vanc.   General:  NAD.  HEENT: normal  Neck: supple. JVP not elevated.  Carotids 2+ bilat; no bruits. No lymphadenopathy or thryomegaly appreciated. Cor: LVAD hum.  Lungs: Clear. Abdomen: obese soft,  nontender, + distended. No hepatosplenomegaly. No bruits or masses. Good bowel sounds. Driveline site with dry dressing. Anchor in place.  Extremities: no cyanosis, clubbing, rash. Warm no edema  Neuro: alert & oriented x 3. No focal deficits. Moves all 4 without problem   Continue vanc. Dr. Donata Clay to re-evaluate wound today. Give sorbitol for constipation. Continue to adjust anti-HTN regimen as needed. INR ok. Discussed dosing with PharmD personally. Ambulate. VAD interrogated personally. Parameters stable.  Arvilla Meres, MD  8:04 AM

## 2022-05-20 NOTE — Progress Notes (Signed)
[facility] for Infectious Disease  Date of Admission:  05/13/2022      Total days of antibiotics 7   Vancomycin           ASSESSMENT:  Kevin Underwood is a 61 y.o. male admitted with acute driveline infection involving destination LVAD. Intraoperative cultures grew out MRSA (R-TMP/S; S-Doxy). Kevin Underwood is now s/p debridement 6/08 with Dr. Prescott Gum. Site is clean but no granulation tissue yet; changing from Dakin's to saline W-D dressing changes for care.  Blood cultures at outside hospital prior to antibiotics were negative from 6/5 draw.   Diabetic with A1C out of range @ 8.2%. Explained that getting A1C to target will help with wound healing going forward.   PICC in place - will place OPAT orders to arrange 4 weeks IV vancomycin from debridement through July 13th at ID follow up.  Twice weekly monitoring of BMP for safety. Pharmacy team appreciated for monitoring Vanc levels   Will sign off and prepare for outpatient care - please call back with any concerns, changes in condition or questions.    PLAN:  OPAT ORDERS:  Diagnosis: LVAD driveline infection   Culture Result: MRSA (R-TMP/S)  No Known Allergies   Discharge antibiotics to be given via PICC line:  Per pharmacy protocol VANCOMYCIN  Aim for Vancomycin trough 15-20 or AUC 400-550 (unless otherwise indicated)   Duration: 4 weeks from surgery   End Date: July 13th   [place] Per Protocol with [employer] Use: Home health RN for IV administration and teaching, line care and labs.    Labs weekly while on IV antibiotics: _x_ CBC with differential _x_ BMP **TWICE WEEKLY ON VANCOMYCIN  __ CMP _x_ CRP _x_ ESR _x_ Vancomycin trough TWICE WEEKLY __ CK  _x_ Please pull PIC at completion of IV antibiotics __ Please leave PIC in place until doctor has seen patient or been notified  Fax weekly labs to [phone]  Clinic Follow Up Appt: Dr. Baxter Flattery 06/20/2022 @ 3:00 pm      Principal  Problem:   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) Active Problems:   COPD with acute exacerbation (HCC)   Type 2 diabetes mellitus ([place])   PAF (paroxysmal atrial fibrillation) (HCC)   LVAD (left ventricular assist device) present ([place])   Hyperthyroidism   AKI (acute kidney injury) ([place])   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 (LVAD) (HCC)    Chlorhexidine Gluconate Cloth  6 each Topical Daily   docusate sodium  100 mg Oral Daily   fluticasone  1 spray Each Nare Daily   guaiFENesin  600 mg Oral BID   hydrALAZINE  10 mg Intravenous Once   hydrALAZINE  50 mg Oral Q8H   insulin aspart  0-20 Units Subcutaneous TID WC   lactose free nutrition  237 mL Oral TID WC   loratadine  10 mg Oral Daily   methimazole  20 mg Oral BID   metoCLOPramide (REGLAN) injection  10 mg Intravenous Q8H   metoprolol succinate  25 mg Oral Daily   pantoprazole  40 mg Oral Daily   polyethylene glycol  17 g Oral BID   sacubitril-valsartan  1 tablet Oral BID   senna-docusate  2 tablet Oral Daily   sertraline  50 mg Oral QHS   sodium chloride flush  10-40 mL Intracatheter Q12H   sodium chloride flush  3 mL Intravenous Q12H   traZODone  150 mg  Oral QHS   warfarin  2 mg Oral ONCE-1600   Warfarin - Pharmacist Dosing Inpatient   Does not apply q1600   zinc sulfate  220 mg Oral Daily    SUBJECTIVE: In some pain after just having surgical site repacked. Feels better with the change in antibiotics though and no problems with current one.  No fevers/chills.  His wife states they showed her how to perform the dressing changes and preparing for discharge. Warfarin has been restarted.  PICC in place - has not had one before.    Review of Systems: Review of Systems  Constitutional:  Negative for chills and fever.  Respiratory:  Negative for cough.   Cardiovascular:  Negative for chest pain and leg swelling.  Gastrointestinal:  Negative for abdominal pain,  diarrhea, nausea and vomiting.  Genitourinary: Negative.   Musculoskeletal:  Negative for back pain and joint pain.  Skin:  Negative for rash.  Neurological:  Negative for dizziness and headaches.    No Known Allergies  OBJECTIVE: Vitals:   05/19/22 1946 05/19/22 2347 05/20/22 0540 05/20/22 0801  BP: 110/90 (!) 119/93 112/73 109/68  Pulse: 82 81 77 90  Resp: _0 Temp: 97.8 F (36.6 C) 97.9 F (36.6 C) 97.9 F (36.6 C) 98.1 F (36.7 C)  TempSrc: Oral Oral Oral Oral  SpO2: 94% 92% 92% 94%  Weight:   79.5 kg   Height:       Body mass index is 24.79 kg/m.  Physical Exam Vitals and nursing note reviewed.  Constitutional:      Comments: Resting quietly in bed. Pleasant.   Cardiovascular:     Rate and Rhythm: Normal rate.     Comments: LVAD humm Pulmonary:     Effort: Pulmonary effort is normal.     Comments: No shortness of breath detected in conversation.  Abdominal:     Comments: Abd dressing clean/dry.   Skin:    Comments: RUE PICC line in place. Dressing dry, no drainage/erythema. Non tender.   Neurological:     Mental Status: He is oriented to person, place, and time.  Psychiatric:        Mood and Affect: Mood normal.        Behavior: Behavior normal.        Thought Content: Thought content normal.        Judgment: Judgment normal.     Lab Results Lab Results  Component Value Date   WBC 11.8 (H) 05/20/2022   HGB 11.7 (L) 05/20/2022   HCT 35.9 (L) 05/20/2022   MCV 82.2 05/20/2022   PLT 299 05/20/2022    Lab Results  Component Value Date   CREATININE 0.91 05/20/2022   BUN 16 05/20/2022   NA 137 05/20/2022   K 3.6 05/20/2022   CL 98 05/20/2022   CO2 29 05/20/2022    Lab Results  Component Value Date   ALT 21 05/13/2022   AST 19 05/13/2022   ALKPHOS 111 05/13/2022   BILITOT 0.7 05/13/2022     Microbiology: Recent Results (from the past 240 hour(s))  Aerobic Culture w Gram Stain (superficial specimen)     Status: None   Collection  Time: 05/13/22 12:47 PM   Specimen: Abdomen; Wound  Result Value Ref Range Status   Specimen Description ABDOMEN  Final   Special Requests Normal  Final   Gram Stain   Final    NO WBC SEEN RARE GRAM POSITIVE COCCI IN PAIRS RARE GRAM POSITIVE RODS  Culture   Final    FEW NORMAL SKIN FLORA Performed at [facility], [place] [address] [place], [place] [address]    Report Status 05/15/2022 FINAL  Final  MRSA Next Gen by PCR, Nasal     Status: Abnormal   Collection Time: 05/13/22 12:49 PM   Specimen: Nasal Mucosa; Nasal Swab  Result Value Ref Range Status   MRSA by PCR Next Gen DETECTED (A) NOT DETECTED Final    Comment: RESULT CALLED TO, READ BACK BY AND VERIFIED WITH: Arlina Robes [id number]  [place] (NOTE) The GeneXpert MRSA Assay (FDA approved for NASAL specimens only), is one component of a comprehensive MRSA colonization surveillance program. It is not intended to diagnose MRSA infection nor to guide or monitor treatment for MRSA infections. Test performance is not FDA approved in patients less than 17 years old. Performed at [facility], [place] [address] [place], [place] [address]   Culture, blood (Routine X 2) w Reflex to ID Panel     Status: None   Collection Time: 05/13/22  1:05 PM   Specimen: BLOOD RIGHT HAND  Result Value Ref Range Status   Specimen Description BLOOD RIGHT HAND  Final   Special Requests   Final    BOTTLES DRAWN AEROBIC AND ANAEROBIC Blood Culture adequate volume   Culture   Final    NO GROWTH 5 DAYS Performed at [facility], [place] [address] [place], [place] [address]    Report Status 05/18/2022 FINAL  Final  Culture, blood (Routine X 2) w Reflex to ID Panel     Status: None   Collection Time: 05/13/22  1:06 PM   Specimen: BLOOD  Result Value Ref Range Status   Specimen Description BLOOD LEFT ANTECUBITAL  Final   Special Requests   Final    BOTTLES DRAWN AEROBIC AND ANAEROBIC Blood Culture adequate volume   Culture   Final    NO  GROWTH 5 DAYS Performed at [facility], [place] [address] [place], [place] [address]    Report Status 05/18/2022 FINAL  Final  Anaerobic culture w Gram Stain     Status: None (Preliminary result)   Collection Time: 05/16/22  8:50 AM   Specimen: Wound  Result Value Ref Range Status   Specimen Description WOUND  Final   Special Requests ABDOMINAL WOUND  Final   Gram Stain   Final    NO WBC SEEN GRAM POSITIVE COCCI IN PAIRS Performed at [facility], [place] [address] [place], [place] [address]    Culture   Final    NO ANAEROBES ISOLATED; CULTURE IN PROGRESS FOR 5 DAYS   Report Status PENDING  Incomplete  Aerobic Culture w Gram Stain (superficial specimen)     Status: None   Collection Time: 05/16/22  8:58 AM   Specimen: Wound  Result Value Ref Range Status   Specimen Description WOUND  Final   Special Requests ABDOMINAL WOUND  Final   Gram Stain   Final    NO WBC SEEN RARE GRAM POSITIVE COCCI IN PAIRS Performed at [facility], [address] [place], [place] [address]    Culture   Final    ABUNDANT METHICILLIN RESISTANT STAPHYLOCOCCUS AUREUS   Report Status 05/18/2022 FINAL  Final   Organism ID, Bacteria METHICILLIN RESISTANT STAPHYLOCOCCUS AUREUS  Final      Susceptibility   Methicillin resistant staphylococcus aureus - MIC*    CIPROFLOXACIN >=8 RESISTANT Resistant  ERYTHROMYCIN >=8 RESISTANT Resistant     GENTAMICIN <=0.5 SENSITIVE Sensitive     OXACILLIN >=4 RESISTANT Resistant     TETRACYCLINE <=1 SENSITIVE Sensitive     VANCOMYCIN <=0.5 SENSITIVE Sensitive     TRIMETH/SULFA >=320 RESISTANT Resistant     CLINDAMYCIN <=0.25 SENSITIVE Sensitive     RIFAMPIN <=0.5 SENSITIVE Sensitive     Inducible Clindamycin NEGATIVE Sensitive     * ABUNDANT METHICILLIN RESISTANT STAPHYLOCOCCUS AUREUS  Acid Fast Smear (AFB)     Status: None   Collection Time: 05/16/22  8:58 AM   Specimen: Wound  Result Value Ref Range Status   AFB Specimen Processing Concentration   Final   Acid Fast Smear Negative  Final    Comment: (NOTE) Performed At: [facility] [address] [place], [place] [id number] Rush Farmer MD IA:[id number]    Source (AFB) ABDOMINAL WOUND  Final    Comment: Performed at [facility], [place] [address], [place], [place] [address]     Janene Madeira, MSN, NP-C [facility] for Infectious Disease [email or web address] Pager: [phone] Office: [phone] [facility] Main Line: [place] Communication Welcome

## 2022-05-20 NOTE — TOC CM/SW Note (Signed)
HF TOC CM contacted [name] Coordinator, [name] RN. She is following for IV abx and waiting for final medication needed for dc. Contacted [facility] rep, Marjory Lies to follow [facility], will confirm they can accept for [facility]. [place], Heart Failure TOC CM [phone]

## 2022-05-20 NOTE — Progress Notes (Signed)
Mobility Specialist Progress Note    05/20/22 1358  Mobility  Activity Ambulated independently in hallway  Level of Assistance Standby assist, set-up cues, supervision of patient - no hands on  Assistive Device None  Distance Ambulated (ft) 400 ft  Activity Response Tolerated well  $Mobility charge 1 Mobility   Pre-Mobility: 74 HR, 95% SpO2 During Mobility: 85 HR Post-Mobility: 85 HR  Pt received in bed and agreeable. No complaints on walk. Returned to BR to attempt BM. Encouraged to pull string when ready to get up.   [place] Mobility Specialist

## 2022-05-21 DIAGNOSIS — T829XXA Unspecified complication of cardiac and vascular prosthetic device, implant and graft, initial encounter: Secondary | ICD-10-CM

## 2022-05-21 DIAGNOSIS — T827XXA Infection and inflammatory reaction due to other cardiac and vascular devices, implants and grafts, initial encounter: Secondary | ICD-10-CM | POA: Diagnosis not present

## 2022-05-21 DIAGNOSIS — Z95811 Presence of heart assist device: Secondary | ICD-10-CM | POA: Diagnosis not present

## 2022-05-21 LAB — ANAEROBIC CULTURE W GRAM STAIN: Gram Stain: NONE SEEN

## 2022-05-21 LAB — GLUCOSE, CAPILLARY
Glucose-Capillary: 207 mg/dL — ABNORMAL HIGH (ref 70–99)
Glucose-Capillary: 237 mg/dL — ABNORMAL HIGH (ref 70–99)
Glucose-Capillary: 180 mg/dL — ABNORMAL HIGH (ref 70–99)
Glucose-Capillary: 289 mg/dL — ABNORMAL HIGH (ref 70–99)

## 2022-05-21 LAB — PROTIME-INR
INR: 3.8 — ABNORMAL HIGH (ref 0.8–1.2)
INR: 2 — ABNORMAL HIGH (ref 0.8–1.2)
Prothrombin Time: 36.9 seconds — ABNORMAL HIGH (ref 11.4–15.2)
Prothrombin Time: 22.1 seconds — ABNORMAL HIGH (ref 11.4–15.2)

## 2022-05-21 LAB — CBC
HCT: 36.8 % — ABNORMAL LOW (ref 39.0–52.0)
Hemoglobin: 12.2 g/dL — ABNORMAL LOW (ref 13.0–17.0)
MCH: 27 pg (ref 26.0–34.0)
MCHC: 33.2 g/dL (ref 30.0–36.0)
MCV: 81.4 fL (ref 80.0–100.0)
Platelets: 313 10*3/uL (ref 150–400)
RBC: 4.52 MIL/uL (ref 4.22–5.81)
RDW: 15.3 % (ref 11.5–15.5)
WBC: 12.4 10*3/uL — ABNORMAL HIGH (ref 4.0–10.5)
nRBC: 0 % (ref 0.0–0.2)

## 2022-05-21 LAB — BASIC METABOLIC PANEL
Anion gap: 9 (ref 5–15)
BUN: 17 mg/dL (ref 8–23)
CO2: 29 mmol/L (ref 22–32)
Calcium: 8.7 mg/dL — ABNORMAL LOW (ref 8.9–10.3)
Chloride: 97 mmol/L — ABNORMAL LOW (ref 98–111)
Creatinine, Ser: 0.95 mg/dL (ref 0.61–1.24)
GFR, Estimated: >60 mL/min (ref >60)
Glucose, Bld: 180 mg/dL — ABNORMAL HIGH (ref 70–99)
Potassium: 3.9 mmol/L (ref 3.5–5.1)
Sodium: 135 mmol/L (ref 135–145)

## 2022-05-21 LAB — MAGNESIUM: Magnesium: 1.8 mg/dL (ref 1.7–2.4)

## 2022-05-21 LAB — LACTATE DEHYDROGENASE: LDH: 124 U/L (ref 98–192)

## 2022-05-21 MED ORDER — HYDRALAZINE HCL 50 MG PO TABS
75.0000 mg | ORAL_TABLET | Freq: Three times a day (TID) | ORAL | Status: DC
  Administered 2022-05-21 – 2022-05-22 (×3): 75 mg via ORAL
  Filled 2022-05-21 (×3): qty 1

## 2022-05-21 MED ORDER — WARFARIN SODIUM 4 MG PO TABS
4.0000 mg | ORAL_TABLET | Freq: Once | ORAL | Status: AC
Start: 2022-05-21 — End: 2022-05-21
  Administered 2022-05-21: 4 mg via ORAL
  Filled 2022-05-21: qty 1

## 2022-05-21 MED ORDER — BOOST / RESOURCE BREEZE PO LIQD CUSTOM
1.0000 | Freq: Three times a day (TID) | ORAL | Status: AC
Start: 2022-05-21 — End: 2022-05-22
  Administered 2022-05-21 (×2): 1 via ORAL

## 2022-05-21 MED ORDER — WARFARIN SODIUM 2 MG PO TABS: 2.0000 mg | ORAL_TABLET | Freq: Once | ORAL | Status: DC

## 2022-05-21 NOTE — Progress Notes (Signed)
ANTICOAGULATION CONSULT NOTE   Pharmacy Consult for Warfarin Indication:  LVAD  No Known Allergies  Patient Measurements: Height: 5' 10.5" (179.1 cm) Weight: 78.1 kg (172 lb 2.9 oz) IBW/kg (Calculated) : 74.15   Vital Signs: Temp: 98 F (36.7 C) (06/13 0400) Temp Source: Oral (06/13 0400) BP: 155/104 (06/13 1210) Pulse Rate: 90 (06/13 1210)  Labs: Recent Labs    05/19/22 1354 05/20/22 0545 05/21/22 0405 05/21/22 1304  HGB 13.0 11.7* 12.2*  --   HCT 39.3 35.9* 36.8*  --   PLT 316 299 313  --   LABPROT 22.1* 24.8* 36.9* 22.1*  INR 2.0* 2.3* 3.8* 2.0*  CREATININE  --  0.91 0.95  --     Estimated Creatinine Clearance: 85.7 mL/min (by C-G formula based on SCr of 0.95 mg/dL).   Medical History: Past Medical History:  Diagnosis Date   "    Arthritis    CAD (coronary artery disease)    a. s/p CABG in 11/2019 with LIMA-LAD, SVG-OM1, SVG-PDA and SVG-D1   CHF (congestive heart failure) (HCC)    a. EF < 20% by echo in 11/2019   Essential hypertension    PAF (paroxysmal atrial fibrillation) (HCC)    Type 2 diabetes mellitus ([place])     Assessment: 61yom with LVAD HM3 implanted 2021, admitted for drive line infection.  INR 2.6 on admit- no bleeding, h/h stable   S/p debridement and surgery ok to resume warfarin 6/8 with no heparin.   INR 3.8 this am and now 2.0 on recheck this afternoon - keeping on low end of goal with possible I&D in future.  No bleeding issues or oozing from wound site. CBC stable.  Of note patient on boost nutritional supplements to help promote wound healing.    PTA warfarin 4mg  TTSS / 6mg  MWF  Goal of Therapy:  INR 2-2.5 Monitor platelets by anticoagulation protocol: Yes   Plan:  Warfarin 4mg  tonight Daily INR  Erin Hearing PharmD., BCPS Clinical Pharmacist 05/21/2022 1:32 PM  Please check [email or web address] for unit-specific pharmacy phone numbers.

## 2022-05-21 NOTE — Progress Notes (Signed)
Mardella Layman, [place] present in room during doppler,  [place] LVAD coordinator and Mardella Layman both aware of PI and elevated doppler.

## 2022-05-21 NOTE — Care Management Important Message (Signed)
Important Message  Patient Details  Name: Kevin Underwood MRN: [id number] Date of Birth: 1961/02/23   Medicare Important Message Given:  Yes Patient has a contact precaution order in place will mail to the patient home address.    Brynlea Spindler 05/21/2022, 2:21 PM

## 2022-05-21 NOTE — TOC CM/SW Note (Addendum)
HF TOC CM spoke to pt at bedside to discuss [facility] RN for dressing changes. Spoke to [employer] RN, she will discuss with [facility] to oversee IV abx, but they cannot do dressing changes. Spoke to [employer], Marylene Land and she will review. TOC CM made surgeon aware, Dr Maren Beach. Will discuss with wife and education provided to wife on how to do dressing changes at home. Office will assist with supplies. Waiting call back from [facility] to confirm.   [facility]-declined  [facility]-out of network [facility]-staffing [facility]-staffing [facility]-staffing [facility]-out of network with insurance [name]-reviewing/accepted [facility]-staffing issues [facility]-declined  HF TOC CM received notification from [facility] rep, unable to accept due to staffing. Referral sent to [facility] rep, Morrie Sheldon, not in network with plan. Referral sent to [facility] [place] and [facility] for review. [facility] unable to accept due to staffing. [name] RN, [name] updated. Isidoro Donning RN3 CCM, Heart Failure TOC CM [phone]

## 2022-05-21 NOTE — Progress Notes (Signed)
Pharmacy Antibiotic Note  Kevin Underwood is a 61 y.o. male with Hs HF and LVAD implant 2021 was admitted on 05/13/2022 with drive line infection.Patient needs broad spectrum GP and pseudomonal and anaerobic coverage. Pharmacy has been consulted for vancomycin.  Patient currently afebrile, wbc stable at 12. Renal function stable.   Outpatient antibiotic orders posted yesterday 6/12. Per surgery patient will stay admitted until wound starts granulation.    Plan: Vancomycin 1000mg  q12hr  Recheck levels as needed for change in renal function or weekly   Height: 5' 10.5" (179.1 cm) Weight: 78.1 kg (172 lb 2.9 oz) IBW/kg (Calculated) : 74.15  Temp (24hrs), Avg:98.3 F (36.8 C), Min:98 F (36.7 C), Max:98.5 F (36.9 C)  Recent Labs  Lab 05/15/22 1650 05/16/22 0058 05/17/22 0153 05/18/22 0137 05/18/22 2150 05/19/22 0735 05/19/22 1354 05/20/22 0545 05/21/22 0405  WBC  --  11.4* 12.8* 14.3*  --   --  12.8* 11.8* 12.4*  CREATININE  --  0.81 0.90 0.99  --   --   --  0.91 0.95  VANCOTROUGH 8*  --   --   --   --  14*  --   --   --   VANCOPEAK  --   --   --   --  37  --   --   --   --      Estimated Creatinine Clearance: 85.7 mL/min (by C-G formula based on SCr of 0.95 mg/dL).    No Known Allergies  05/23/22 PharmD., BCPS Clinical Pharmacist 05/21/2022 1:43 PM  Please check AMION for all [facility] Pharmacy numbers After 10:00 PM, call Main Pharmacy [phone]

## 2022-05-21 NOTE — Progress Notes (Signed)
5 Days Post-Op Procedure(s) (LRB): VAD DRIVELINE DEBRIDEMENT (N/A) Subjective: Abdominal wound examined and dressing changed today with some excisional debridement of fibrinous exudate at the base of the wound.  The depth of the wound close to the posterior rectus sheath.  There is rectus muscle, fairly healthy, at the lateral aspect of the wound.  There is a fibrinous exudate on the the lower coding to the power cord which was debrided.  We have transitioned to using saline wet-to-dry gauze for the packing since the wound is cleaning up and the Dakin's solution can inhibit granulation tissue. Expect prolonged need for IV antibiotics, good nutrition, and effective wound care to solve this serious situation.  Objective: Vital signs in last 24 hours: Temp:  [98 F (36.7 C)-98.4 F (36.9 C)] 98 F (36.7 C) (06/13 0400) Pulse Rate:  [60-90] 90 (06/13 1210) Cardiac Rhythm: Normal sinus rhythm (06/13 0848) Resp:  [16-20] 17 (06/13 1210) BP: (99-163)/(86-108) 155/104 (06/13 1210) SpO2:  [95 %-98 %] 96 % (06/13 1210) Weight:  [78.1 kg] 78.1 kg (06/13 0400)  Hemodynamic parameters for last 24 hours:    Intake/Output from previous day: 06/12 0701 - 06/13 0700 In: 1073.8 [P.O.:240; I.V.:33.8; IV Piggyback:800] Out: 650 [Urine:650] Intake/Output this shift: No intake/output data recorded.   Exam  Normal VAD hum Neuro intact No peripheral edema Abdomen soft   Lab Results: Recent Labs    05/20/22 0545 05/21/22 0405  WBC 11.8* 12.4*  HGB 11.7* 12.2*  HCT 35.9* 36.8*  PLT 299 313   BMET:  Recent Labs    05/20/22 0545 05/21/22 0405  NA 137 135  K 3.6 3.9  CL 98 97*  CO2 29 29  GLUCOSE 171* 180*  BUN 16 17  CREATININE 0.91 0.95  CALCIUM 8.6* 8.7*    PT/INR:  Recent Labs    05/21/22 1304  LABPROT 22.1*  INR 2.0*   ABG    Component Value Date/Time   PHART 7.508 (H) 11/20/2020 0438   HCO3 24.8 09/09/2021 0313   TCO2 29 11/20/2020 0438   ACIDBASEDEF 6.0 (H)  11/24/2019 1718   O2SAT 63.4 09/09/2021 0313   CBG (last 3)  Recent Labs    05/21/22 0605 05/21/22 1205 05/21/22 1701  GLUCAP 207* 237* 180*    Assessment/Plan: S/P Procedure(s) (LRB): VAD DRIVELINE DEBRIDEMENT (N/A) 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 Wound currently being packed daily saline wet-to-dry with excisional debridement of fat necrosis and eschar as needed. Continue with daily dressing changes, IV antibiotics, nutritional support.  Continue Coumadin dosing for INR 2.0-2.5   LOS: 8 days    Lovett Sox 05/21/2022

## 2022-05-21 NOTE — Progress Notes (Addendum)
Patient ID: Kevin Underwood, male   DOB: [birth year], 61 y.o.   MRN: [id number]     Advanced Heart Failure Rounding Note  PCP-Cardiologist: Nona Dell, MD   Subjective:    6/5 Admitted with sepsis/driveline infection. Started on vanc and zosyn . Planning for surgical debridement.  6/7 ID consulted.  6/8 Surgical debridement with deep cultures. -> Wound cx with MRSA  ID recommending IV Vanc until f/u 07/13. PICC placed.   AF. WBC 11.8>12.4.  INR up to 3.8 this am.  MAPs better yesterday. But elevated since last night, 90s-100s  Doesn't feel well. Reports a lot of back pain and neck pain the last few days. Had some nausea and dry heaving this am. Received PRN oxycodone.  BM yesterday after sorbitol.   LVAD Interrogation HM III: Speed: 5500 Flow: 3.3 PI: 8 Power: 4 31 PI events  Objective:   Weight Range: 78.1 kg Body mass index is 24.36 kg/m.   Vital Signs:   Temp:  [97.5 F (36.4 C)-98.5 F (36.9 C)] 98 F (36.7 C) (06/13 0400) Pulse Rate:  [60-90] 84 (06/13 0400) Resp:  [16-20] 20 (06/13 0400) BP: (90-122)/(67-94) 99/86 (06/13 0400) SpO2:  [93 %-98 %] 95 % (06/13 0400) Weight:  [78.1 kg] 78.1 kg (06/13 0400) Last BM Date : 05/15/22  Weight change: Filed Weights   05/19/22 0438 05/20/22 0540 05/21/22 0400  Weight: 79.5 kg 79.5 kg 78.1 kg    Intake/Output:   Intake/Output Summary (Last 24 hours) at 05/21/2022 0737 Last data filed at 05/21/2022 0405 Gross per 24 hour  Intake 1073.8 ml  Output 650 ml  Net 423.8 ml    Maps 90s-100s  Physical Exam  Physical Exam: GENERAL: Sitting up in bed. Appears uncomfortable. HEENT: normal  NECK: Supple, No JVD. 2+ bilaterally, no bruits.   CARDIAC:  Mechanical heart sounds with LVAD hum present.  LUNGS:  Clear to auscultation bilaterally.  ABDOMEN:  Soft, round, nontender, positive bowel sounds x4.     LVAD exit site: Dressing dry and intact.  Stabilization device present and accurately applied.  EXTREMITIES:   Warm and dry, no cyanosis, clubbing, rash or edema, RUE PICC NEUROLOGIC:  Alert and oriented x 4.  Gait steady.  No aphasia.  No dysarthria.  Affect pleasant.       Telemetry   SR 80s-90s  Labs    CBC Recent Labs    05/20/22 0545 05/21/22 0405  WBC 11.8* 12.4*  HGB 11.7* 12.2*  HCT 35.9* 36.8*  MCV 82.2 81.4  PLT 299 313   Basic Metabolic Panel Recent Labs    90/30/09 0545 05/21/22 0405  NA 137 135  K 3.6 3.9  CL 98 97*  CO2 29 29  GLUCOSE 171* 180*  BUN 16 17  CREATININE 0.91 0.95  CALCIUM 8.6* 8.7*  MG 1.8 1.8   Liver Function Tests No results for input(s): "AST", "ALT", "ALKPHOS", "BILITOT", "PROT", "ALBUMIN" in the last 72 hours.  No results for input(s): "LIPASE", "AMYLASE" in the last 72 hours. Cardiac Enzymes No results for input(s): "CKTOTAL", "CKMB", "CKMBINDEX", "TROPONINI" in the last 72 hours.  BNP: BNP (last 3 results) Recent Labs    05/17/22 0153  BNP 222.1*    ProBNP (last 3 results) No results for input(s): "PROBNP" in the last 8760 hours.   D-Dimer No results for input(s): "DDIMER" in the last 72 hours. Hemoglobin A1C No results for input(s): "HGBA1C" in the last 72 hours.  Fasting Lipid Panel No results for input(s): "CHOL", "  HDL", "LDLCALC", "TRIG", "CHOLHDL", "LDLDIRECT" in the last 72 hours. Thyroid Function Tests No results for input(s): "TSH", "T4TOTAL", "T3FREE", "THYROIDAB" in the last 72 hours.  Invalid input(s): "FREET3"   Other results:   Imaging    No results found.   Medications:     Scheduled Medications:  Chlorhexidine Gluconate Cloth  6 each Topical Daily   docusate sodium  100 mg Oral Daily   fluticasone  1 spray Each Nare Daily   guaiFENesin  600 mg Oral BID   hydrALAZINE  10 mg Intravenous Once   hydrALAZINE  50 mg Oral Q8H   insulin aspart  0-20 Units Subcutaneous TID WC   lactose free nutrition  237 mL Oral TID WC   loratadine  10 mg Oral Daily   methimazole  20 mg Oral BID    metoCLOPramide (REGLAN) injection  10 mg Intravenous Q8H   metoprolol succinate  25 mg Oral Daily   pantoprazole  40 mg Oral Daily   polyethylene glycol  17 g Oral BID   sacubitril-valsartan  1 tablet Oral BID   senna-docusate  2 tablet Oral Daily   sertraline  50 mg Oral QHS   sodium chloride flush  10-40 mL Intracatheter Q12H   sodium chloride flush  3 mL Intravenous Q12H   traZODone  150 mg Oral QHS   Warfarin - Pharmacist Dosing Inpatient   Does not apply q1600   zinc sulfate  220 mg Oral Daily    Infusions:  sodium chloride Stopped (05/18/22 1622)   vancomycin Stopped (05/20/22 2102)    PRN Medications: sodium chloride, acetaminophen, fentaNYL (SUBLIMAZE) injection, ipratropium-albuterol, ondansetron (ZOFRAN) IV, oxyCODONE, sodium chloride flush, sodium chloride flush, sodium hypochlorite    Patient Profile   Kevin Underwood is a 61 year old with h/o HFrEF, HMIII LVAD, CAD, S/P CABG 2020 with Maze/LAA clipping, DMII, COPD, HTN, PAF, and previous ETOH abuse.    Admitted after syncope/driveline infection.  Assessment/Plan   1. ID: Driveline infection.  CT abdomen/pelvis with small fluid collection along pre-peritoneal driveline.  Early sepsis. Lactate initially elevated.  MAP improved with IV fluid, creatinine lower. . S/p driveline site debridement 6/8. Surgical wound cultures growing MRSA  Blood Cx NGTD.  - AF today. WBCs 12.4 today. - ID following. Recommend vanc with completion day July 13 th. OPAT orders placed. PICC placed - HH orders placed.   - Dr Donata Clay to re-examine wound today. Needs granulation base before he can receive wound care from wife at home. - TOC CM following for home antibiotics.  2.  Chronic systolic CHF: Echo 10/21 with EF 20-25%, mildly decreased RV function. LHC/RHC in 12/21 with patent grafts, low output. Suspect mixed ischemic/nonischemic cardiomyopathy (prior heavy ETOH and drugs as well as CAD).  No ETOH, drugs, smoking since CABG in 12/20.  Admitted with cardiogenic shock in 12/21, had placement of Impella 5.5 initially, now s/p Heartmate 3 LVAD on 11/17/20.  Ramp echo 2/23 with speed decreased to 5500 rpm.  NYHA class II, symptomatically had been doing well.  - Volume status ok  - On hydralazine and Entresto for HTN. Increase hydralazine today d/t elevated MAPs. Suspect pain contributing to a certain degree, has PRN pain meds. - Does not need diuretic today.  - INR 2.3>>3.8. Goal 2-2.5. Discussed warfarin dosing with PharmD. Hold today. - LDH 124 3. CAD: S/p CABG 12/20.  LHC pre-VAD with patent grafts, no target for intervention. - No s/s angina  - Continue statin.  3. Atrial fibrillation: Paroxysmal.  S/p Maze and LA appendage clip with CABG in 12/20.  NS - Off amiodarone with hyperthyroidism. - Coumadin as above 5. Type 2 diabetes: SSI.  6. Gout:   - Continue allopurinol 100 mg daily.  7. PACs/PVCs  8. COPD: H/o COPD exacerbation, overall feels better on Trelegy.  - Followup with pulmonary.  9. 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  He missed his endocrinology appt.  - He is now off amiodarone.   - Continue methimazole 20 mg bid, TSH was mildly elevated.   - Will need to reschedule endocrine appt.   10. Nausea: Intermittent. - Avoid pain meds on empty stomach.  Ambulate.  TOC consulted for home antibiotics.   Length of Stay: 8  FINCH, LINDSAY N, PA-C  05/21/2022, 7:37 AM  Advanced Heart Failure Team Pager [phone] (M-F; 7a - 5p)  Please contact [facility] for night-coverage after hours (5p -7a ) and weekends on [email or web address]  Patient seen and examined with the above-signed Advanced Practice Provider and/or Housestaff. I personally reviewed laboratory data, imaging studies and relevant notes. I independently examined the patient and formulated the important aspects of the plan. I have edited the note to reflect any of my changes or salient points. I have personally discussed the plan with the patient  and/or family.  Not feeling well. Still with abdominal symptoms and nausea. Had good BM yesterday. No emesis. MAPs elevated.  Wound evaluated by Dr. Morton Peters. It is clean but no granulation yet.   General: Sitting in chair  HEENT: normal  Neck: supple. JVP not elevated.  Carotids 2+ bilat; no bruits. No lymphadenopathy or thryomegaly appreciated. Cor: LVAD hum.  Lungs: Clear. Abdomen: obese soft, nontender, non-distended. No hepatosplenomegaly. No bruits or masses. Good bowel sounds. Driveline site clean. Anchor in place.  Wound dressin intact  Extremities: no cyanosis, clubbing, rash. Warm no edema  Neuro: alert & oriented x 3. No focal deficits. Moves all 4 without problem   Suspect nausea and constipation due to abx and pain meds. Follow ab exam. MAPs elevated. Increasing hydralazine. Wound still needs to granulate prior to discharge. Dr. Donata Clay following. INR 3.8 today. Hold warfarin. D/w PharmD. Watch dosing with abx/decreased po intake.   Arvilla Meres, MD  8:31 AM

## 2022-05-21 NOTE — Progress Notes (Signed)
LVAD Coordinator Rounding Note:  Admitted 05/13/22 to Dr Claris Gladden service due to possible sepsis and drive line infection.   HM III LVAD implanted on 11/17/20 by Dr Orvan Seen under destination therapy criteria.    6/5 Admitted with sepsis/driveline infection. Started on vanc and zosyn . Planning for surgical debridement.  6/7 ID consulted.  6/8 Surgical debridement with deep cultures.   Pt laying in bed upon my arrival. Pt had a rough weekend with nausea, vomiting, and pain. He has received PRN Zofran, Compazine, and Fentanyl with some relief. He has been hypertensive. Entresto increased over the weekend.   Pt c/o of pain and nausea this morning. BP is elevated, PI around 10. Provider team aware and will increase BP meds.  Per ID: Discharge antibiotics to be given via PICC line:  Per pharmacy protocol VANCOMYCIN  Aim for Vancomycin trough 15-20 or AUC 400-550 (unless otherwise indicated) Duration: 4 weeks from surgery  End Date: July 13th   Per DR Prescott Gum pt will need to remain in hospital this week until his driveline looks  better. Will plan to change driveline this afternoon with PVT.  Vital signs: Temp: 98 HR: 84 Doppler Pressure: 154 Automatic BP: 163/108 (125) O2 Sat: 95% on RA Wt: 173.1>173.0>175.2 lbs    LVAD interrogation reveals:  Speed: 5500 Flow: 4.5 Power: 4 w PI: 2.7 Hct: 36  Alarms: none Events: 30 today  Fixed speed: 5500 Low speed limit: 5200   Drive Line: Existing VAD dressing removed and site care performed using sterile technique by Dr Prescott Gum. Drive line exit site cleaned with Chlora prep applicators x 2, allowed to dry, packed with 2 saline moistened 4 x 4 and covered with several 4 x 4. Dr Lucianne Lei Trigt debride wound today. Small amount of serosanguinous drainage. See picture below. No redness, tenderness, foul odor or rash noted. Drive line anchor re-applied. Dr Prescott Gum will change dressing again tomorrow afternoon between cases at bedside.        Labs:  LDH trend: 132>116>127>141>117>124  INR trend: 2.9>1.9>1.9>2.3>3.8  WBC: 17.8 (at [facility] [place])>15.6>11.2>10.9>11.4>12.8>11.8>12.4  Infection: 05/13/22>>Blood cultures>> no growth 4 days; final  05/13/22>>Drive line culture>>rare gram + cocci in pairs & rare gram + rods; final culture few normal skin flora 05/16/22>>Deep DL wound culture in OR>>rare gram + cocci in pairs; abundant staph auerus; final  05/16/22>>Fungus cx from OR>>pending 05/16/22>>AFB cx from OR>>MRSA  Anticoagulation Plan: -INR Goal: 2.0 - 2.5 -ASA Dose: none  Blood Products: 05/14/22>> 2 FFP  Drips:  Heparin- stopped  Plan/Recommendations:  1. Page VAD coordinator for VAD equipment or drive line issues 2. Daily wet to dry dressings with saline solution per Dr. Darcey Nora. pt will need premedication.  [name] RN [place] Coordinator  Office: [phone]  24/7 Pager: [phone]

## 2022-05-22 DIAGNOSIS — T829XXA Unspecified complication of cardiac and vascular prosthetic device, implant and graft, initial encounter: Secondary | ICD-10-CM

## 2022-05-22 DIAGNOSIS — T827XXA Infection and inflammatory reaction due to other cardiac and vascular devices, implants and grafts, initial encounter: Secondary | ICD-10-CM | POA: Diagnosis not present

## 2022-05-22 DIAGNOSIS — Z95811 Presence of heart assist device: Secondary | ICD-10-CM | POA: Diagnosis not present

## 2022-05-22 LAB — VANCOMYCIN, RANDOM: Vancomycin Rm: 37 ug/mL

## 2022-05-22 LAB — BASIC METABOLIC PANEL
Anion gap: 9 (ref 5–15)
BUN: 24 mg/dL — ABNORMAL HIGH (ref 8–23)
CO2: 27 mmol/L (ref 22–32)
Calcium: 8.6 mg/dL — ABNORMAL LOW (ref 8.9–10.3)
Chloride: 97 mmol/L — ABNORMAL LOW (ref 98–111)
Creatinine, Ser: 1.63 mg/dL — ABNORMAL HIGH (ref 0.61–1.24)
GFR, Estimated: 48 mL/min — ABNORMAL LOW (ref >60)
Glucose, Bld: 188 mg/dL — ABNORMAL HIGH (ref 70–99)
Potassium: 3.8 mmol/L (ref 3.5–5.1)
Sodium: 133 mmol/L — ABNORMAL LOW (ref 135–145)

## 2022-05-22 LAB — LACTATE DEHYDROGENASE: LDH: 143 U/L (ref 98–192)

## 2022-05-22 LAB — GLUCOSE, CAPILLARY
Glucose-Capillary: 254 mg/dL — ABNORMAL HIGH (ref 70–99)
Glucose-Capillary: 212 mg/dL — ABNORMAL HIGH (ref 70–99)
Glucose-Capillary: 169 mg/dL — ABNORMAL HIGH (ref 70–99)
Glucose-Capillary: 158 mg/dL — ABNORMAL HIGH (ref 70–99)

## 2022-05-22 LAB — PROTIME-INR
INR: 2 — ABNORMAL HIGH (ref 0.8–1.2)
Prothrombin Time: 22.5 seconds — ABNORMAL HIGH (ref 11.4–15.2)

## 2022-05-22 LAB — PREALBUMIN: Prealbumin: 15.2 mg/dL — ABNORMAL LOW (ref 18–38)

## 2022-05-22 LAB — CBC
HCT: 38.2 % — ABNORMAL LOW (ref 39.0–52.0)
Hemoglobin: 12.5 g/dL — ABNORMAL LOW (ref 13.0–17.0)
MCH: 26.4 pg (ref 26.0–34.0)
MCHC: 32.7 g/dL (ref 30.0–36.0)
MCV: 80.8 fL (ref 80.0–100.0)
Platelets: 331 10*3/uL (ref 150–400)
RBC: 4.73 MIL/uL (ref 4.22–5.81)
RDW: 15.5 % (ref 11.5–15.5)
WBC: 15 10*3/uL — ABNORMAL HIGH (ref 4.0–10.5)
nRBC: 0 % (ref 0.0–0.2)

## 2022-05-22 LAB — MAGNESIUM: Magnesium: 2 mg/dL (ref 1.7–2.4)

## 2022-05-22 MED ORDER — VANCOMYCIN VARIABLE DOSE PER UNSTABLE RENAL FUNCTION (PHARMACIST DOSING): Status: DC

## 2022-05-22 MED ORDER — LIVING WELL WITH DIABETES BOOK
Freq: Once | Status: AC
  Filled 2022-05-22: qty 1

## 2022-05-22 MED ORDER — HYDRALAZINE HCL 50 MG PO TABS
100.0000 mg | ORAL_TABLET | Freq: Three times a day (TID) | ORAL | Status: DC
Start: 2022-05-22 — End: 2022-05-23
  Administered 2022-05-22 – 2022-05-23 (×3): 100 mg via ORAL
  Filled 2022-05-22 (×3): qty 2

## 2022-05-22 MED ORDER — ADULT MULTIVITAMIN W/MINERALS CH
1.0000 | ORAL_TABLET | Freq: Every day | ORAL | Status: DC
  Administered 2022-05-23 – 2022-06-03 (×12): 1 via ORAL
  Filled 2022-05-22 (×13): qty 1

## 2022-05-22 MED ORDER — ZINC SULFATE 220 (50 ZN) MG PO CAPS
220.0000 mg | ORAL_CAPSULE | Freq: Two times a day (BID) | ORAL | Status: AC
  Administered 2022-05-22 – 2022-05-29 (×14): 220 mg via ORAL
  Filled 2022-05-22 (×14): qty 1

## 2022-05-22 MED ORDER — HYDRALAZINE HCL 20 MG/ML IJ SOLN
10.0000 mg | Freq: Once | INTRAMUSCULAR | Status: AC
  Administered 2022-05-22: 10 mg via INTRAVENOUS
  Filled 2022-05-22: qty 1

## 2022-05-22 MED ORDER — ORAL CARE MOUTH RINSE: 15.0000 mL | OROMUCOSAL | Status: DC | PRN

## 2022-05-22 MED ORDER — ASCORBIC ACID 500 MG PO TABS
500.0000 mg | ORAL_TABLET | Freq: Every day | ORAL | Status: DC
  Administered 2022-05-23 – 2022-06-03 (×12): 500 mg via ORAL
  Filled 2022-05-22 (×13): qty 1

## 2022-05-22 MED ORDER — INSULIN GLARGINE-YFGN 100 UNIT/ML ~~LOC~~ SOLN
12.0000 [IU] | Freq: Every day | SUBCUTANEOUS | Status: DC
  Administered 2022-05-22 – 2022-05-23 (×2): 12 [IU] via SUBCUTANEOUS
  Filled 2022-05-22 (×2): qty 0.12

## 2022-05-22 MED ORDER — LIDOCAINE 5 % EX PTCH
1.0000 | MEDICATED_PATCH | CUTANEOUS | Status: DC
Start: 2022-05-22 — End: 2022-06-03
  Administered 2022-05-22 – 2022-06-01 (×6): 1 via TRANSDERMAL
  Filled 2022-05-22 (×10): qty 1

## 2022-05-22 MED ORDER — WARFARIN SODIUM 4 MG PO TABS
4.0000 mg | ORAL_TABLET | Freq: Once | ORAL | Status: AC
  Administered 2022-05-22: 4 mg via ORAL
  Filled 2022-05-22: qty 1

## 2022-05-22 MED ORDER — RINGERS IV SOLN: Freq: Once | INTRAVENOUS | Status: AC

## 2022-05-22 MED ORDER — SODIUM CHLORIDE 0.9 % IV SOLN
8.0000 mg/kg | Freq: Every day | INTRAVENOUS | Status: DC
  Administered 2022-05-22: 650 mg via INTRAVENOUS
  Filled 2022-05-22 (×2): qty 13

## 2022-05-22 MED ORDER — GABAPENTIN 300 MG PO CAPS
300.0000 mg | ORAL_CAPSULE | Freq: Two times a day (BID) | ORAL | Status: DC
  Administered 2022-05-22 – 2022-06-03 (×25): 300 mg via ORAL
  Filled 2022-05-22 (×25): qty 1

## 2022-05-22 NOTE — Progress Notes (Signed)
Mobility Specialist Progress Note    05/22/22 1135  Mobility  Activity Contraindicated/medical hold   Pt not feeling well. Will f/u as appropriate.   [place] Mobility Specialist

## 2022-05-22 NOTE — Progress Notes (Signed)
Initial Nutrition Assessment  DOCUMENTATION CODES:   Non-severe (moderate) malnutrition in context of chronic illness  INTERVENTION:  Liberalize diet from a heart healthy/carb modified to a 2g sodium diet to provide widest variety of menu options to enhance nutritional adequacy Boost Plus po TID, each supplement provides 360 kcal and 14 grams of protein Vitamin C 500mg  daily Zinc 220 mg BID  MVI with minerals daily  NUTRITION DIAGNOSIS:   Moderate Malnutrition related to chronic illness (CHF, h/o LVAD, COPD) as evidenced by mild fat depletion, mild muscle depletion, severe muscle depletion.  GOAL:   Patient will meet greater than or equal to 90% of their needs  MONITOR:   PO intake, Supplement acceptance, Diet advancement, Labs, Weight trends, I & O's  REASON FOR ASSESSMENT:   Consult Wound healing  ASSESSMENT:   Pt admitted with LVAD complication drive infection/syncope. PMH significant for  CHF, HMIII LVAD, CAD, s/p CABG 2020 with Maze/LAA clipping, T2DM, COPD, HTN, PAF and previous ETOH abuse.  06/08: s/p surgical debridement of driveline infection  Per MD, d/c pending wound granulation. Pt continues with nausea likely attributed to abx. Discussing alternatives to determine whether nausea will improve with med changes.   Pt reports during admission having a poor appetite. He states that he has been receiving automatic trays as he does not feel like ordering his meals but has only been taking a few bites of his meals. He also endorses early satiety and ongoing nausea during admission which is not resolved with medications. PTA pt states that he was eating well with no changes in his intake. He recalls eating 2 meals per day including a variety of foods. He typically takes a MVI at home. He denies checking his blood sugar at home.   Discussed with pt importance of adequate nutritional intake, prioritizing protein to help stimulate wound healing. He previously had Boost Breeze  ordered but has since been switched to Boost Plus. Pt states that he cannot tolerate 08/08 and prefers to only receive Chocolate Boost. Spoke with RN about pt's preference.   Pt denies wt loss and states that his wt has remained stable ~175 lbs. Reviewed wt hx. His current wt is consistent with admit wt at 173 lbs. Noted pt to have had 5% wt loss within the last 2 months PTA, however suspect a degree of wt loss could be attributed to volume status with h/o CHF.   Edema: generalized  Medications: colace, SSI 0-20 units TID, reglan, protonix, miralax, senna, coumadin, warfarin, zinc 220mg  daily, IV abx  Labs: sodium 133, BUN 24, Cr 1.63, GFR 48, HgbA1c 8.2%, CBG's 254, 289, 180, 237 x24 hours  NUTRITION - FOCUSED PHYSICAL EXAM:  Flowsheet Row Most Recent Value  Orbital Region Mild depletion  Upper Arm Region Moderate depletion  Thoracic and Lumbar Region Mild depletion  Buccal Region No depletion  Temple Region Mild depletion  Clavicle Bone Region Severe depletion  Clavicle and Acromion Bone Region Severe depletion  Scapular Bone Region Moderate depletion  Dorsal Hand No depletion  Patellar Region Moderate depletion  Anterior Thigh Region Mild depletion  Posterior Calf Region Mild depletion  Edema (RD Assessment) None  Hair Reviewed  Eyes Reviewed  Mouth Reviewed  Skin Reviewed  Nails Reviewed      Diet Order:   Diet Order             Diet 2 gram sodium Room service appropriate? Yes; Fluid consistency: Thin  Diet effective now  EDUCATION NEEDS:   Education needs have been addressed  Skin:  Skin Assessment: Skin Integrity Issues: Skin Integrity Issues:: Incisions Incisions: L abdomen (closed)  Last BM:  6/13 (type 6)  Height:   Ht Readings from Last 1 Encounters:  05/17/22 5' 10.5" (1.791 m)    Weight:   Wt Readings from Last 1 Encounters:  05/22/22 78.3 kg   BMI:  Body mass index is 24.42 kg/m.  Estimated Nutritional Needs:    Kcal:  2300-2500  Protein:  100-115g  Fluid:  >/=2.3L  Drusilla Kanner, RDN, LDN Clinical Nutrition

## 2022-05-22 NOTE — Inpatient Diabetes Management (Signed)
Inpatient Diabetes Program Recommendations  AACE/ADA: New Consensus Statement on Inpatient Glycemic Control (2015)  Target Ranges:  Prepandial:   less than 140 mg/dL      Peak postprandial:   less than 180 mg/dL (1-2 hours)      Critically ill patients:  140 - 180 mg/dL   Lab Results  Component Value Date   GLUCAP 254 (H) 05/22/2022   HGBA1C 8.2 (H) 05/13/2022    Review of Glycemic Control  Latest Reference Range & Units 05/20/22 11:50 05/20/22 16:33 05/20/22 22:30 05/21/22 06:05 05/21/22 12:05 05/21/22 17:01 05/21/22 21:34 05/22/22 06:52  Glucose-Capillary 70 - 99 mg/dL 244 (H) 317 (H) 108 (H) 207 (H) 237 (H) 180 (H) 289 (H) 254 (H)  (H): Data is abnormally high  Diabetes history: DM2 Outpatient Diabetes medications: Metformin 1 gm bid Current orders for Inpatient glycemic control: Novolog correction 0-20 units tid  Inpatient Diabetes Program Recommendations:   Please consider: -Add Farxiga 10 mg if meets criteria -Add Semglee 12 units daily (0.15 units/kg x 78.3 kg) Secure chat to Darrick Grinder NP with recs.  Thank you, Nani Gasser. Kelani Robart, RN, MSN, CDE  Diabetes Coordinator Inpatient Glycemic Control Team Team Pager [phone] (8am-5pm) 05/22/2022 11:23 AM

## 2022-05-22 NOTE — Progress Notes (Addendum)
Brief ID Update:   Notified over concern for post-antibiotic induced nausea despite pre medication with anti-emetics. Home health also difficult to secure for 2x weekly visits.   I worry greatly that if we switch him to PO zyvox it will exacerbate the GI side effects and really be intolerable for him.   Will switch to once daily daptomycin to see if he is more tolerant of this and if favorable to [place].   D/W ID team and Ebony Hail from [place] team.    OPAT ORDERS:  Diagnosis: Driveline Abscess, destination LVAD   Culture Result: MRSA (R-TMP/S)   No Known Allergies   Discharge antibiotics to be given via PICC line:  Per pharmacy protocol DAPTOMYCIN 8mg /kg/48h (CrCl low)   Duration: 4 weeks from surgery   End Date: July 13th   [place] Per Protocol with [employer] Use: Home health RN for IV administration and teaching, line care and labs.    Labs weekly while on IV antibiotics: _x_ CBC with differential __ BMP **TWICE WEEKLY ON VANCOMYCIN  _x_ CMP _x_ CRP _x_ ESR __ Vancomycin trough TWICE WEEKLY _x_ CK  _x_ Please pull PIC at completion of IV antibiotics __ Please leave PIC in place until doctor has seen patient or been notified  Fax weekly labs to [phone]  Clinic Follow Up Appt: Dr. Baxter Flattery 06/20/2022 @ 3:00 pm    Janene Madeira, MSN, NP-C [facility] for Infectious Disease [email or web address] Pager: [phone] Office: [phone] [facility] Main Line: [place] Communication Welcome

## 2022-05-22 NOTE — Progress Notes (Addendum)
Patient ID: JONATHON TAN, male   DOB: Jul 03, 1961, 61 y.o.   MRN: [id number]     Advanced Heart Failure Rounding Note  PCP-Cardiologist: Nona Dell, MD   Subjective:    6/5 Admitted with sepsis/driveline infection. Started on vanc and zosyn . Planning for surgical debridement.  6/7 ID consulted.  6/8 Surgical debridement with deep cultures. -> Wound cx with MRSA  ID recommending IV Vanc until f/u 07/13. PICC placed.   AF. WBC 11.8>12.4>15   INR 2  Creatinine 0.9>1.6   Complaining of back pain. Denies SOB.   LVAD Interrogation HM III: Speed: 5500 Flow: 3.8PI:6.4  Power: 4   49 PI events   Objective:   Weight Range: 78.3 kg Body mass index is 24.42 kg/m.   Vital Signs:   Temp:  [97.9 F (36.6 C)-98.6 F (37 C)] 98.3 F (36.8 C) (06/14 0438) Pulse Rate:  [80-106] 80 (06/14 0438) Resp:  [17-21] 18 (06/14 0438) BP: (72-163)/(58-108) 134/98 (06/14 0438) SpO2:  [92 %-96 %] 94 % (06/14 0438) Weight:  [78.3 kg] 78.3 kg (06/14 0452) Last BM Date : 05/21/22  Weight change: Filed Weights   05/20/22 0540 05/21/22 0400 05/22/22 0452  Weight: 79.5 kg 78.1 kg 78.3 kg    Intake/Output:  No intake or output data in the 24 hours ending 05/22/22 0719   Maps 70-100s  Physical Exam   Physical Exam: GENERAL: No acute distress. HEENT: normal  NECK: Supple, JVP flat .  2+ bilaterally, no bruits.  No lymphadenopathy or thyromegaly appreciated.   CARDIAC:  Mechanical heart sounds with LVAD hum present.  LUNGS:  Clear to auscultation bilaterally.  ABDOMEN:  Soft, round, nontender, positive bowel sounds x4.     LVAD exit site: Dressing around driveline. CDI  EXTREMITIES:  Warm and dry, no cyanosis, clubbing, rash or edema . RUE PICC  NEUROLOGIC:  Alert and oriented x 3.    No aphasia.  No dysarthria.  Affect flat    Telemetry   SR 80s-90s  Labs    CBC Recent Labs    05/21/22 0405 05/22/22 0447  WBC 12.4* 15.0*  HGB 12.2* 12.5*  HCT 36.8* 38.2*  MCV 81.4  80.8  PLT 313 331   Basic Metabolic Panel Recent Labs    85/02/77 0405 05/22/22 0447  NA 135 133*  K 3.9 3.8  CL 97* 97*  CO2 29 27  GLUCOSE 180* 188*  BUN 17 24*  CREATININE 0.95 1.63*  CALCIUM 8.7* 8.6*  MG 1.8 2.0   Liver Function Tests No results for input(s): "AST", "ALT", "ALKPHOS", "BILITOT", "PROT", "ALBUMIN" in the last 72 hours.  No results for input(s): "LIPASE", "AMYLASE" in the last 72 hours. Cardiac Enzymes No results for input(s): "CKTOTAL", "CKMB", "CKMBINDEX", "TROPONINI" in the last 72 hours.  BNP: BNP (last 3 results) Recent Labs    05/17/22 0153  BNP 222.1*    ProBNP (last 3 results) No results for input(s): "PROBNP" in the last 8760 hours.   D-Dimer No results for input(s): "DDIMER" in the last 72 hours. Hemoglobin A1C No results for input(s): "HGBA1C" in the last 72 hours.  Fasting Lipid Panel No results for input(s): "CHOL", "HDL", "LDLCALC", "TRIG", "CHOLHDL", "LDLDIRECT" in the last 72 hours. Thyroid Function Tests No results for input(s): "TSH", "T4TOTAL", "T3FREE", "THYROIDAB" in the last 72 hours.  Invalid input(s): "FREET3"   Other results:   Imaging    No results found.   Medications:     Scheduled Medications:  Chlorhexidine Gluconate Cloth  6 each Topical Daily   docusate sodium  100 mg Oral Daily   feeding supplement  1 Container Oral TID BM   fluticasone  1 spray Each Nare Daily   guaiFENesin  600 mg Oral BID   hydrALAZINE  10 mg Intravenous Once   hydrALAZINE  75 mg Oral Q8H   insulin aspart  0-20 Units Subcutaneous TID WC   lactose free nutrition  237 mL Oral TID WC   loratadine  10 mg Oral Daily   methimazole  20 mg Oral BID   metoCLOPramide (REGLAN) injection  10 mg Intravenous Q8H   metoprolol succinate  25 mg Oral Daily   pantoprazole  40 mg Oral Daily   polyethylene glycol  17 g Oral BID   sacubitril-valsartan  1 tablet Oral BID   senna-docusate  2 tablet Oral Daily   sertraline  50 mg Oral QHS    sodium chloride flush  10-40 mL Intracatheter Q12H   sodium chloride flush  3 mL Intravenous Q12H   traZODone  150 mg Oral QHS   Warfarin - Pharmacist Dosing Inpatient   Does not apply q1600   zinc sulfate  220 mg Oral Daily    Infusions:  sodium chloride Stopped (05/18/22 1622)   vancomycin 1,000 mg (05/21/22 2020)    PRN Medications: sodium chloride, acetaminophen, fentaNYL (SUBLIMAZE) injection, ipratropium-albuterol, ondansetron (ZOFRAN) IV, oxyCODONE, sodium chloride flush, sodium chloride flush, sodium hypochlorite    Patient Profile   Mr Pierpoint is a 61 year old with h/o HFrEF, HMIII LVAD, CAD, S/P CABG 2020 with Maze/LAA clipping, DMII, COPD, HTN, PAF, and previous ETOH abuse.    Admitted after syncope/driveline infection.  Assessment/Plan   1. ID: Driveline infection.  CT abdomen/pelvis with small fluid collection along pre-peritoneal driveline.  Early sepsis. Lactate initially elevated.  MAP improved with IV fluid, creatinine lower. . S/p driveline site debridement 6/8. Surgical wound cultures growing MRSA  Blood Cx NGTD.  - AF today. WBCs 12.4 >15 today. - ID following. Recommend vanc with completion day July 13 th. OPAT orders placed. PICC placed - HH orders placed.  TOC CM following for home antibiotics.  - Check prealbumin.  - Consult dietitian for wound healing.  2.  Chronic systolic CHF: Echo 10/21 with EF 20-25%, mildly decreased RV function. LHC/RHC in 12/21 with patent grafts, low output. Suspect mixed ischemic/nonischemic cardiomyopathy (prior heavy ETOH and drugs as well as CAD).  No ETOH, drugs, smoking since CABG in 12/20. Admitted with cardiogenic shock in 12/21, had placement of Impella 5.5 initially, now s/p Heartmate 3 LVAD on 11/17/20.  Ramp echo 2/23 with speed decreased to 5500 rpm.  NYHA class II, symptomatically had been doing well.  - Volume status ok  - Maps running high. Creatinine trending up. Will stop entresto and increase hydralazine 100 mg  TID.  - Does not need diuretic today.  - INR 2.0. Goal 2-2.5. Discussed warfarin dosing with PharmD.  - LDH stable 143.  3. CAD: S/p CABG 12/20.  LHC pre-VAD with patent grafts, no target for intervention. - No s/s angina  - Continue statin.  3. Atrial fibrillation: Paroxysmal.  S/p Maze and LA appendage clip with CABG in 12/20.  NS - Off amiodarone with hyperthyroidism. - Coumadin as above 5. Type 2 diabetes: SSI. Running higher. Consult diabetes coordinator 6. Gout:   - Continue allopurinol 100 mg daily.  7. PACs/PVCs  8. COPD: H/o COPD exacerbation, overall feels better on Trelegy.  - Followup with pulmonary.  9.  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  He missed his endocrinology appt.  - He is now off amiodarone.   - Continue methimazole 20 mg bid, TSH was mildly elevated.   - Will need to reschedule endocrine appt.   10. Nausea: Intermittent. None today.  - Avoid pain meds on empty stomach. 11. AKI  -Creatinine trending up 0.9>1.6  - Hold entresto.  - Follow daily BMET   TOC working on [facility] for antibiotics.    Length of Stay: 9  Amy Clegg, NP  05/22/2022, 7:19 AM  Advanced Heart Failure Team Pager [phone] (M-F; 7a - 5p)  Please contact [facility] for night-coverage after hours (5p -7a ) and weekends on [email or web address]  Patient seen and examined with the above-signed Advanced Practice Provider and/or Housestaff. I personally reviewed laboratory data, imaging studies and relevant notes. I independently examined the patient and formulated the important aspects of the plan. I have edited the note to reflect any of my changes or salient points. I have personally discussed the plan with the patient and/or family.  Remains nauseated. Having some back pain. Afebrile. Po intake poor. Remains on IV abx.   General:  NAD.  HEENT: normal  Neck: supple. JVP not elevated.  Carotids 2+ bilat; no bruits. No lymphadenopathy or thryomegaly appreciated. Cor: LVAD hum.  Lungs:  Clear. Abdomen: obese soft, nontender, non-distended. No hepatosplenomegaly. No bruits or masses. Good bowel sounds. Driveline site clean. Anchor in place.  Dressing ok  Extremities: no cyanosis, clubbing, rash. Warm no edema  Neuro: alert & oriented x 3. No focal deficits. Moves all 4 without problem   Suspect nausea due to abx. Will alternatives with ID. Continue reglan and zofran. Wound site not granulating yet. Need to follow closely. Scr up. He appears dry. Will give 1L LR. Encouraged po intake. VAD interrogated personally. Parameters stable.   Arvilla Meres, MD  1:31 PM

## 2022-05-22 NOTE — Progress Notes (Signed)
ANTICOAGULATION CONSULT NOTE   Pharmacy Consult for Warfarin Indication:  LVAD  No Known Allergies  Patient Measurements: Height: 5' 10.5" (179.1 cm) Weight: 78.3 kg (172 lb 9.9 oz) IBW/kg (Calculated) : 74.15   Vital Signs: Temp: 98.3 F (36.8 C) (06/14 0438) Temp Source: Oral (06/14 0438) BP: 134/98 (06/14 0438) Pulse Rate: 80 (06/14 0438)  Labs: Recent Labs    05/20/22 0545 05/21/22 0405 05/21/22 1304 05/22/22 0447  HGB 11.7* 12.2*  --  12.5*  HCT 35.9* 36.8*  --  38.2*  PLT 299 313  --  331  LABPROT 24.8* 36.9* 22.1* 22.5*  INR 2.3* 3.8* 2.0* 2.0*  CREATININE 0.91 0.95  --  1.63*     Estimated Creatinine Clearance: 49.9 mL/min (A) (by C-G formula based on SCr of 1.63 mg/dL (H)).   Medical History: Past Medical History:  Diagnosis Date   "    Arthritis    CAD (coronary artery disease)    a. s/p CABG in 11/2019 with LIMA-LAD, SVG-OM1, SVG-PDA and SVG-D1   CHF (congestive heart failure) (HCC)    a. EF < 20% by echo in 11/2019   Essential hypertension    PAF (paroxysmal atrial fibrillation) (HCC)    Type 2 diabetes mellitus (HCC)     Assessment: 61yom with LVAD HM3 implanted 2021, admitted for drive line infection.  INR 2.6 on admit- no bleeding, h/h stable   S/p debridement and surgery ok to resume warfarin 6/8 with no heparin.   INR 2 this AM - keeping on low end of goal with possible I&D in future.  No bleeding issues or oozing from wound site. CBC stable.  Of note patient on boost nutritional supplements to help promote wound healing.    PTA warfarin 4mg  TTSS / 6mg  MWF  Goal of Therapy:  INR 2-2.5 Monitor platelets by anticoagulation protocol: Yes   Plan:  Warfarin 4mg  again tonight Daily INR  , , [facility] Clinical Pharmacist  05/22/2022 9:20 AM   [facility] pharmacy phone numbers are listed on [email or web address]

## 2022-05-22 NOTE — Progress Notes (Signed)
CT Surgery  Wound examined and repcked sterile wet/dry 4x4s Wound is clean but with zero healing / granulation tissue - he is not eating hardly anything.nutition consult placed.  Blood pressure (!) 67/58, pulse 78, temperature 97.7 F (36.5 C), temperature source Oral, resp. rate 12, height 5' 10.5" (1.791 m), weight 78.3 kg, SpO2 99 %.

## 2022-05-22 NOTE — Progress Notes (Signed)
LVAD Coordinator Rounding Note:  Admitted 05/13/22 to Dr Alford Highland service due to possible sepsis and drive line infection.   HM III LVAD implanted on 11/17/20 by Dr Vickey Sages under destination therapy criteria.    6/5 Admitted with sepsis/driveline infection. Started on vanc and zosyn . Planning for surgical debridement.  6/7 ID consulted.  6/8 Surgical debridement with deep cultures.   Pt laying in bed upon my arrival. States he feels terrible this morning due to back pain. Denies nausea at this time. Bedside RN at bedside administering PRN pain medication. Pt stood up at bedside and repositioned in bed with minimal assist. Verbal cues given multiple times to make sure he carries VAD equipment with him when moving in and out of bed, and not to let it dangle at his side.   Entresto discontinued. PO Hydralazine increased to 100 mg TID. BP elevated this morning due to pain. VAD numbers stable.   Per ID: Discharge antibiotics to be given via PICC line:  Per pharmacy protocol VANCOMYCIN  Aim for Vancomycin trough 15-20 or AUC 400-550 (unless otherwise indicated) Duration: 4 weeks from surgery  End Date: July 13th   Per Dr Donata Clay pt will need to remain in hospital this week until his driveline looks  better. Will plan to change driveline this afternoon with PVT.  Vital signs: Temp: 98 HR: 78 Doppler Pressure: 140 Automatic BP: 1637/97 (109) O2 Sat: 98% on RA Wt: 173.1>173.0>175.2>172.6 lbs    LVAD interrogation reveals:  Speed: 5500 Flow: 3.9 Power: 4.1 w PI: 5.5 Hct: 36  Alarms: none Events: 53 so far today; 100 + yesterday  Fixed speed: 5500 Low speed limit: 5200   Drive Line: Existing VAD dressing clean, dry, and intact. Dr Donata Clay will change dressing again this afternoon between OR cases at bedside. Bedside RN to assist.   Labs:  LDH trend: 132>116>127>141>117>124>143  INR trend: 2.9>1.9>1.9>2.3>3.8>2.0  WBC: 17.8 (at [facility]  [place])>15.6>11.2>10.9>11.4>12.8>11.8>12.4>15  Infection: 05/13/22>>Blood cultures>> no growth 4 days; final  05/13/22>>Drive line culture>>rare gram + cocci in pairs & rare gram + rods; final culture few normal skin flora 05/16/22>>Deep DL wound culture in OR>>rare gram + cocci in pairs; abundant staph auerus; final  05/16/22>>Fungus cx from OR>>pending 05/16/22>>AFB cx from OR>>MRSA  Anticoagulation Plan: -INR Goal: 2.0 - 2.5 -ASA Dose: none  Blood Products: 05/14/22>> 2 FFP  Drips:  Heparin- stopped  Plan/Recommendations:  1. Page VAD coordinator for VAD equipment or drive line issues 2. Daily wet to dry dressings with saline solution per Dr. Maren Beach. Pt will need premedication. Dressing supplies at bedside.   Alyce Pagan RN VAD Coordinator  Office: [phone]  24/7 Pager: [phone]

## 2022-05-22 NOTE — Progress Notes (Signed)
PHARMACY CONSULT NOTE FOR:  OUTPATIENT  PARENTERAL ANTIBIOTIC THERAPY (OPAT)  Indication: MRSA LVAD Driveline Infection Regimen: Daptomycin 650 mg IV every 24 hours End date: 06/20/22  IV antibiotic discharge orders are pended. To discharging provider:  please sign these orders via discharge navigator,  Select New Orders & click on the button choice - Manage This Unsigned Work.     Thank you for allowing pharmacy to be a part of this patient's care.  Alycia Rossetti, PharmD, BCPS Infectious Diseases Clinical Pharmacist 05/22/2022 4:31 PM   **Pharmacist phone directory can now be found on [email or web address] ([email or web address]).  Listed under [place].

## 2022-05-22 NOTE — Plan of Care (Signed)
  Problem: Clinical Measurements: Goal: Respiratory complications will improve Outcome: Progressing   Problem: Activity: Goal: Risk for activity intolerance will decrease Outcome: Progressing   Problem: Nutrition: Goal: Adequate nutrition will be maintained Outcome: Progressing   Problem: Coping: Goal: Level of anxiety will decrease Outcome: Progressing   Problem: Pain Managment: Goal: General experience of comfort will improve Outcome: Progressing   Problem: Coping: Goal: Ability to adjust to condition or change in health will improve Outcome: Progressing   Problem: Nutritional: Goal: Maintenance of adequate nutrition will improve Outcome: Progressing

## 2022-05-23 ENCOUNTER — Inpatient Hospital Stay (HOSPITAL_COMMUNITY): Payer: 59

## 2022-05-23 DIAGNOSIS — T829XXS Unspecified complication of cardiac and vascular prosthetic device, implant and graft, sequela: Secondary | ICD-10-CM | POA: Diagnosis not present

## 2022-05-23 DIAGNOSIS — T827XXA Infection and inflammatory reaction due to other cardiac and vascular devices, implants and grafts, initial encounter: Secondary | ICD-10-CM | POA: Diagnosis not present

## 2022-05-23 DIAGNOSIS — Z95811 Presence of heart assist device: Secondary | ICD-10-CM | POA: Diagnosis not present

## 2022-05-23 DIAGNOSIS — A4902 Methicillin resistant Staphylococcus aureus infection, unspecified site: Secondary | ICD-10-CM | POA: Diagnosis not present

## 2022-05-23 LAB — BASIC METABOLIC PANEL
Anion gap: 11 (ref 5–15)
Anion gap: 13 (ref 5–15)
Anion gap: 13 (ref 5–15)
BUN: 34 mg/dL — ABNORMAL HIGH (ref 8–23)
BUN: 37 mg/dL — ABNORMAL HIGH (ref 8–23)
BUN: 42 mg/dL — ABNORMAL HIGH (ref 8–23)
CO2: 26 mmol/L (ref 22–32)
CO2: 24 mmol/L (ref 22–32)
CO2: 24 mmol/L (ref 22–32)
Calcium: 8.5 mg/dL — ABNORMAL LOW (ref 8.9–10.3)
Calcium: 8.2 mg/dL — ABNORMAL LOW (ref 8.9–10.3)
Calcium: 8.4 mg/dL — ABNORMAL LOW (ref 8.9–10.3)
Chloride: 97 mmol/L — ABNORMAL LOW (ref 98–111)
Chloride: 94 mmol/L — ABNORMAL LOW (ref 98–111)
Chloride: 94 mmol/L — ABNORMAL LOW (ref 98–111)
Creatinine, Ser: 3.22 mg/dL — ABNORMAL HIGH (ref 0.61–1.24)
Creatinine, Ser: 3.69 mg/dL — ABNORMAL HIGH (ref 0.61–1.24)
Creatinine, Ser: 3.95 mg/dL — ABNORMAL HIGH (ref 0.61–1.24)
GFR, Estimated: 21 mL/min — ABNORMAL LOW (ref >60)
GFR, Estimated: 18 mL/min — ABNORMAL LOW (ref >60)
GFR, Estimated: 16 mL/min — ABNORMAL LOW (ref >60)
Glucose, Bld: 146 mg/dL — ABNORMAL HIGH (ref 70–99)
Glucose, Bld: 269 mg/dL — ABNORMAL HIGH (ref 70–99)
Glucose, Bld: 164 mg/dL — ABNORMAL HIGH (ref 70–99)
Potassium: 4.3 mmol/L (ref 3.5–5.1)
Potassium: 4 mmol/L (ref 3.5–5.1)
Potassium: 4.2 mmol/L (ref 3.5–5.1)
Sodium: 134 mmol/L — ABNORMAL LOW (ref 135–145)
Sodium: 131 mmol/L — ABNORMAL LOW (ref 135–145)
Sodium: 131 mmol/L — ABNORMAL LOW (ref 135–145)

## 2022-05-23 LAB — URINALYSIS, ROUTINE W REFLEX MICROSCOPIC
Bilirubin Urine: NEGATIVE
Glucose, UA: 50 mg/dL — AB
Hgb urine dipstick: NEGATIVE
Ketones, ur: NEGATIVE mg/dL
Leukocytes,Ua: NEGATIVE
Nitrite: NEGATIVE
Protein, ur: 100 mg/dL — AB
Specific Gravity, Urine: 1.014 (ref 1.005–1.030)
pH: 5 (ref 5.0–8.0)

## 2022-05-23 LAB — CBC
HCT: 35.7 % — ABNORMAL LOW (ref 39.0–52.0)
Hemoglobin: 11.6 g/dL — ABNORMAL LOW (ref 13.0–17.0)
MCH: 26.4 pg (ref 26.0–34.0)
MCHC: 32.5 g/dL (ref 30.0–36.0)
MCV: 81.1 fL (ref 80.0–100.0)
Platelets: 302 10*3/uL (ref 150–400)
RBC: 4.4 MIL/uL (ref 4.22–5.81)
RDW: 15.7 % — ABNORMAL HIGH (ref 11.5–15.5)
WBC: 14.7 10*3/uL — ABNORMAL HIGH (ref 4.0–10.5)
nRBC: 0 % (ref 0.0–0.2)

## 2022-05-23 LAB — GLUCOSE, CAPILLARY
Glucose-Capillary: 272 mg/dL — ABNORMAL HIGH (ref 70–99)
Glucose-Capillary: 353 mg/dL — ABNORMAL HIGH (ref 70–99)
Glucose-Capillary: 160 mg/dL — ABNORMAL HIGH (ref 70–99)
Glucose-Capillary: 63 mg/dL — ABNORMAL LOW (ref 70–99)
Glucose-Capillary: 198 mg/dL — ABNORMAL HIGH (ref 70–99)

## 2022-05-23 LAB — PROTIME-INR
INR: 2.5 — ABNORMAL HIGH (ref 0.8–1.2)
Prothrombin Time: 26.6 seconds — ABNORMAL HIGH (ref 11.4–15.2)

## 2022-05-23 LAB — MAGNESIUM: Magnesium: 2 mg/dL (ref 1.7–2.4)

## 2022-05-23 LAB — ACID FAST SMEAR (AFB, MYCOBACTERIA): Acid Fast Smear: NEGATIVE

## 2022-05-23 LAB — CK: Total CK: 27 U/L — ABNORMAL LOW (ref 49–397)

## 2022-05-23 LAB — LACTATE DEHYDROGENASE: LDH: 130 U/L (ref 98–192)

## 2022-05-23 MED ORDER — LACTATED RINGERS IV BOLUS: 1000.0000 mL | Freq: Once | INTRAVENOUS | Status: DC

## 2022-05-23 MED ORDER — INSULIN ASPART 100 UNIT/ML IJ SOLN
3.0000 [IU] | Freq: Three times a day (TID) | INTRAMUSCULAR | Status: DC
  Administered 2022-05-24 – 2022-05-28 (×13): 3 [IU] via SUBCUTANEOUS

## 2022-05-23 MED ORDER — INSULIN GLARGINE-YFGN 100 UNIT/ML ~~LOC~~ SOLN
15.0000 [IU] | Freq: Every day | SUBCUTANEOUS | Status: DC
  Administered 2022-05-24 – 2022-05-28 (×5): 15 [IU] via SUBCUTANEOUS
  Filled 2022-05-23 (×6): qty 0.15

## 2022-05-23 MED ORDER — SODIUM CHLORIDE 0.9 % IV SOLN
8.0000 mg/kg | INTRAVENOUS | Status: DC
  Administered 2022-05-24 – 2022-06-01 (×5): 650 mg via INTRAVENOUS
  Filled 2022-05-23 (×6): qty 13

## 2022-05-23 MED ORDER — WARFARIN SODIUM 2 MG PO TABS
2.0000 mg | ORAL_TABLET | Freq: Once | ORAL | Status: AC
Start: 2022-05-23 — End: 2022-05-23
  Administered 2022-05-23: 2 mg via ORAL
  Filled 2022-05-23: qty 1

## 2022-05-23 MED ORDER — LACTATED RINGERS IV BOLUS
1000.0000 mL | Freq: Once | INTRAVENOUS | Status: AC
Start: 2022-05-23 — End: 2022-05-23
  Administered 2022-05-23 (×2): 500 mL via INTRAVENOUS

## 2022-05-23 NOTE — Progress Notes (Addendum)
Patient ID: Kevin Underwood, male   DOB: 08-11-1961, 61 y.o.   MRN: [id number]     Advanced Heart Failure Rounding Note  PCP-Cardiologist: Nona Dell, MD   Subjective:    6/5 Admitted with sepsis/driveline infection. Started on vanc and zosyn . Planning for surgical debridement.  6/7 ID consulted.  6/8 Surgical debridement with deep cultures. -> Wound cx with MRSA  ID switched Vancomycin to Dapto yesterday d/t concern for abx induced nausea.   CK 27  AF. WBC 11.8>12.4>15>14.7.  INR 2.5  Creatinine 0.9>1.6, ? 3.22. BUN trending up. Recheck STAT.  Feeling better today. Less nausea and pain better controlled. Now eating more. Prealbumin 15.   Has not voided since yesterday afternoon.   LVAD Interrogation HM III: Speed: 5500 Flow: 4.6 PI: 2.4  Power: 4    Numerous PI events.   Objective:   Weight Range: 79.7 kg Body mass index is 24.85 kg/m.   Vital Signs:   Temp:  [97.7 F (36.5 C)-98.8 F (37.1 C)] 98.1 F (36.7 C) (06/15 0340) Pulse Rate:  [72-81] 81 (06/15 0340) Resp:  [12-22] 14 (06/15 0340) BP: (67-155)/(58-106) 111/79 (06/15 0340) SpO2:  [92 %-99 %] 96 % (06/15 0340) Weight:  [79.7 kg] 79.7 kg (06/15 0625) Last BM Date : 05/21/22  Weight change: Filed Weights   05/21/22 0400 05/22/22 0452 05/23/22 0625  Weight: 78.1 kg 78.3 kg 79.7 kg    Intake/Output:   Intake/Output Summary (Last 24 hours) at 05/23/2022 0728 Last data filed at 05/23/2022 0340 Gross per 24 hour  Intake 1043.16 ml  Output 350 ml  Net 693.16 ml     Maps 80s, as low as 60s yesterday afternoon  Physical Exam   Physical Exam: GENERAL: No distress. Lying in bed. HEENT: normal  NECK: Supple, JVP flat.  2+ bilaterally, no bruits.   CARDIAC:  Mechanical heart sounds with LVAD hum present.  LUNGS:  Clear to auscultation bilaterally.  ABDOMEN:  Soft, round, nontender.     LVAD exit site: well-healed and incorporated.  Dressing dry and intact.  EXTREMITIES:  Warm and dry, no  cyanosis, clubbing, rash or edema  NEUROLOGIC:  Alert and oriented x 4.  Gait steady.  No aphasia.  No dysarthria.  Affect pleasant.         Telemetry   SR 70s  Labs    CBC Recent Labs    05/22/22 0447 05/23/22 0344  WBC 15.0* 14.7*  HGB 12.5* 11.6*  HCT 38.2* 35.7*  MCV 80.8 81.1  PLT 331 302   Basic Metabolic Panel Recent Labs    70/62/37 0447 05/23/22 0344  NA 133* 134*  K 3.8 4.3  CL 97* 97*  CO2 27 26  GLUCOSE 188* 146*  BUN 24* 34*  CREATININE 1.63* 3.22*  CALCIUM 8.6* 8.5*  MG 2.0 2.0   Liver Function Tests No results for input(s): "AST", "ALT", "ALKPHOS", "BILITOT", "PROT", "ALBUMIN" in the last 72 hours.  No results for input(s): "LIPASE", "AMYLASE" in the last 72 hours. Cardiac Enzymes Recent Labs    05/23/22 0344  CKTOTAL 27*    BNP: BNP (last 3 results) Recent Labs    05/17/22 0153  BNP 222.1*    ProBNP (last 3 results) No results for input(s): "PROBNP" in the last 8760 hours.   D-Dimer No results for input(s): "DDIMER" in the last 72 hours. Hemoglobin A1C No results for input(s): "HGBA1C" in the last 72 hours.  Fasting Lipid Panel No results for input(s): "CHOL", "HDL", "LDLCALC", "  TRIG", "CHOLHDL", "LDLDIRECT" in the last 72 hours. Thyroid Function Tests No results for input(s): "TSH", "T4TOTAL", "T3FREE", "THYROIDAB" in the last 72 hours.  Invalid input(s): "FREET3"   Other results:   Imaging    No results found.   Medications:     Scheduled Medications:  vitamin C  500 mg Oral Daily   Chlorhexidine Gluconate Cloth  6 each Topical Daily   docusate sodium  100 mg Oral Daily   fluticasone  1 spray Each Nare Daily   gabapentin  300 mg Oral BID   guaiFENesin  600 mg Oral BID   hydrALAZINE  100 mg Oral Q8H   insulin aspart  0-20 Units Subcutaneous TID WC   insulin glargine-yfgn  12 Units Subcutaneous Daily   lactose free nutrition  237 mL Oral TID WC   lidocaine  1 patch Transdermal Q24H   loratadine  10 mg  Oral Daily   methimazole  20 mg Oral BID   metoCLOPramide (REGLAN) injection  10 mg Intravenous Q8H   metoprolol succinate  25 mg Oral Daily   multivitamin with minerals  1 tablet Oral Daily   pantoprazole  40 mg Oral Daily   polyethylene glycol  17 g Oral BID   senna-docusate  2 tablet Oral Daily   sertraline  50 mg Oral QHS   sodium chloride flush  10-40 mL Intracatheter Q12H   sodium chloride flush  3 mL Intravenous Q12H   traZODone  150 mg Oral QHS   Warfarin - Pharmacist Dosing Inpatient   Does not apply q1600   zinc sulfate  220 mg Oral BID    Infusions:  sodium chloride Stopped (05/18/22 1622)   DAPTOmycin (CUBICIN) 650 mg in sodium chloride 0.9 % IVPB Stopped (05/22/22 1922)    PRN Medications: sodium chloride, acetaminophen, ipratropium-albuterol, ondansetron (ZOFRAN) IV, mouth rinse, oxyCODONE, sodium chloride flush, sodium chloride flush, sodium hypochlorite    Patient Profile   Kevin Underwood is a 61 year old with h/o HFrEF, HMIII LVAD, CAD, S/P CABG 2020 with Maze/LAA clipping, DMII, COPD, HTN, PAF, and previous ETOH abuse.    Admitted after syncope/driveline infection.  Assessment/Plan   1. ID: Driveline infection.  CT abdomen/pelvis with small fluid collection along pre-peritoneal driveline.  Early sepsis. Lactate initially elevated.  MAP improved with IV fluid, creatinine lower. . S/p driveline site debridement 6/8. Surgical wound cultures growing MRSA  Blood Cx NGTD.  - AF today. WBCs 12.4 >15 > 14.7 today. - ID following.Vanc switched to Dapto with completion day July 13 th. OPAT orders placed. PICC placed - [place] orders placed.  TOC CM following for home antibiotics.  - Prealbumin 15. Consulted dietitian for wound healing. Diet liberalized. Appreciate assistance. 2.  Chronic systolic CHF: Echo [id number] with EF 20-25%, mildly decreased RV function. LHC/RHC in 12/21 with patent grafts, low output. Suspect mixed ischemic/nonischemic cardiomyopathy (prior heavy ETOH and  drugs as well as CAD).  No ETOH, drugs, smoking since CABG in 12/20. Admitted with cardiogenic shock in 12/21, had placement of Impella 5.5 initially, now s/p Heartmate 3 LVAD on 11/17/20.  Ramp echo 2/23 with speed decreased to 5500 rpm.  NYHA class II, symptomatically had been doing well.  - Appears dry. Poor po intake several days. Scr trending up.  - Does not need diuretic today.  - INR 2.5. Goal 2-2.5. Warfarin dosing per PharmD.  - LDH stable 130.  3. CAD: S/p CABG 12/20.  LHC pre-VAD with patent grafts, no target for intervention. - No s/s  angina  - Continue statin.  3. Atrial fibrillation: Paroxysmal.  S/p Maze and LA appendage clip with CABG in 12/20.  NS - Off amiodarone with hyperthyroidism. - Coumadin as above 5. Type 2 diabetes: SSI. Running higher. Consulted diabetes coordinator. Semglee added. 6. Gout:   - Continue allopurinol 100 mg daily.  7. PACs/PVCs  8. COPD: H/o COPD exacerbation, overall feels better on Trelegy.  - Followup with pulmonary.  9. 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  He missed his endocrinology appt.  - He is now off amiodarone.   - Continue methimazole 20 mg bid, TSH was mildly elevated.   - Will need to reschedule endocrine appt.   10. Nausea: Intermittent. Improved today. - Pain and under better control and abx adjusted. 11. AKI  - Creatinine trending up 0.9>1.6>3.2 - Holding entresto. Stop hydralazine for now. MAPs improved with pain control. - Suspect volume depletion. Poor po intake several days. Received 1L LR yesterday. Has not voided since last night. May need another L fluids. - Recheck BMET stat. Check bladder scan.   Length of Stay: 10  Underwood, Kevin Garnet, PA-C  05/23/2022, 7:28 AM  Advanced Heart Failure Team Pager [phone] (M-F; 7a - 5p)  Please contact [facility] for night-coverage after hours (5p -7a ) and weekends on [email or web address]  Patient seen and examined with the above-signed Advanced Practice Provider and/or  Housestaff. I personally reviewed laboratory data, imaging studies and relevant notes. I independently examined the patient and formulated the important aspects of the plan. I have edited the note to reflect any of my changes or salient points. I have personally discussed the plan with the patient and/or family.  Abx switched yesterday. Nausea much improved. Feels much better today. SCr climbing. MAPs ok   General:  NAD.  HEENT: normal  Neck: supple. JVP not elevated.  Carotids 2+ bilat; no bruits. No lymphadenopathy or thryomegaly appreciated. Cor: LVAD hum.  Lungs: Clear. Abdomen: obese soft, nontender, non-distended. No hepatosplenomegaly. No bruits or masses. Good bowel sounds. Driveline site clean. Anchor in place.  Extremities: no cyanosis, clubbing, rash. Warm no edema  Neuro: alert & oriented x 3. No focal deficits. Moves all 4 without problem   Nausea improved with switching abx but significant AKI. Unclear source. ?volume depletion/hypotension versus med reaction vs infection.   Give IVF. Check UA and renal u/s. Low threshold to call Renal.   VAD interrogated personally. Parameters stable.  Arvilla Meres, MD  10:38 AM

## 2022-05-23 NOTE — Progress Notes (Signed)
ANTICOAGULATION CONSULT NOTE   Pharmacy Consult for Warfarin Indication:  LVAD  No Known Allergies  Patient Measurements: Height: 5' 10.5" (179.1 cm) Weight: 79.7 kg (175 lb 11.3 oz) IBW/kg (Calculated) : 74.15   Vital Signs: Temp: 98.1 F (36.7 C) (06/15 0735) Temp Source: Oral (06/15 0340) BP: 78/54 (06/15 0826) Pulse Rate: 81 (06/15 0340)  Labs: Recent Labs    05/21/22 0405 05/21/22 1304 05/22/22 0447 05/23/22 0344 05/23/22 0810  HGB 12.2*  --  12.5* 11.6*  --   HCT 36.8*  --  38.2* 35.7*  --   PLT 313  --  331 302  --   LABPROT 36.9* 22.1* 22.5* 26.6*  --   INR 3.8* 2.0* 2.0* 2.5*  --   CREATININE 0.95  --  1.63* 3.22* 3.69*  CKTOTAL  --   --   --  27*  --      Estimated Creatinine Clearance: 22.1 mL/min (A) (by C-G formula based on SCr of 3.69 mg/dL (H)).   Medical History: Past Medical History:  Diagnosis Date   "    Arthritis    CAD (coronary artery disease)    a. s/p CABG in 11/2019 with LIMA-LAD, SVG-OM1, SVG-PDA and SVG-D1   CHF (congestive heart failure) (HCC)    a. EF < 20% by echo in 11/2019   Essential hypertension    PAF (paroxysmal atrial fibrillation) (HCC)    Type 2 diabetes mellitus (HCC)     Assessment: 61yom with LVAD HM3 implanted 2021, admitted for drive line infection.  INR 2.6 on admit- no bleeding, h/h stable   S/p debridement and surgery ok to resume warfarin 6/8 with no heparin.   INR up to 2.5 this AM - keeping on low end of goal with possible I&D in future.  No bleeding issues or oozing from wound site. Hemoglobin down slightly from 12.5>11.6.   Of note patient on boost nutritional supplements to help promote wound healing.    PTA warfarin 4mg  TTSS / 6mg  MWF  Goal of Therapy:  INR 2-2.5 Monitor platelets by anticoagulation protocol: Yes   Plan:  Warfarin 2mg  tonight Daily INR  PharmD., BCPS Clinical Pharmacist 05/23/2022 10:16 AM

## 2022-05-23 NOTE — Progress Notes (Signed)
Repeat Scr 3.69.  Will give back 1L LR  Check UA and renal US

## 2022-05-23 NOTE — Plan of Care (Signed)
  Problem: Health Behavior/Discharge Planning: Goal: Ability to manage health-related needs will improve Outcome: Progressing   Problem: Clinical Measurements: Goal: Ability to maintain clinical measurements within normal limits will improve Outcome: Progressing Goal: Diagnostic test results will improve Outcome: Progressing   Problem: Nutrition: Goal: Adequate nutrition will be maintained Outcome: Progressing   Problem: Elimination: Goal: Will not experience complications related to urinary retention Outcome: Progressing   Problem: Skin Integrity: Goal: Risk for impaired skin integrity will decrease Outcome: Progressing

## 2022-05-23 NOTE — TOC CM/SW Note (Signed)
HF TOC CM received confirmation from [facility] rep, Marylene Land that they will accept for [facility]. TOC CM contacted [name] to make aware. Isidoro Donning RN3 CCM, Heart Failure TOC CM [phone]

## 2022-05-23 NOTE — Plan of Care (Signed)

## 2022-05-23 NOTE — Progress Notes (Signed)
Nutrition Follow-up  DOCUMENTATION CODES:   Non-severe (moderate) malnutrition in context of chronic illness  INTERVENTION:   - Continue Boost Plus po TID, each supplement provides 360 kcal and 14 grams of protein  - Continue with liberalized 2 gram sodium diet, can liberalize further to Regular if needed  - Encourage PO intake  - Continue vitamin C 500 mg daily and zinc sulfate 220 mg BID to aid in wound healing  - Continue MVI with minerals daily  NUTRITION DIAGNOSIS:   Moderate Malnutrition related to chronic illness (CHF, h/o LVAD, COPD) as evidenced by mild fat depletion, mild muscle depletion, severe muscle depletion.  Ongoing, being addressed via diet liberalization and oral nutrition supplements  GOAL:   Patient will meet greater than or equal to 90% of their needs  Progressing  MONITOR:   PO intake, Supplement acceptance, Diet advancement, Labs, Weight trends, I & O's  REASON FOR ASSESSMENT:   Consult Wound healing  ASSESSMENT:   Pt admitted with LVAD complication drive infection/syncope. PMH significant for  CHF, HMIII LVAD, CAD, s/p CABG 2020 with Maze/LAA clipping, T2DM, COPD, HTN, PAF and previous ETOH abuse.  06/08 - s/p surgical debridement of driveline infection  Noted pt's antibiotics were switched yesterday due to concern for antibiotic-induced nausea.  Spoke with pt at bedside. He reports dramatic improvement in PO intake and appetite that occurred almost overnight. Pt states that he woke up at 4:00 am and has been eating ever since. RN confirms pt is eating well and almost "can't get enough to eat."  Pt reports eating sherbet and several packets of graham crackers at 4:00 am. He states that he consumed 100% of his breakfast meal and almost 100% of his lunch meal. He has consumed 1 full Boost Plus supplement at this time.  Discussed again the importance of adequate kcal and protein intake. Pt feels like he has really improved. He is willing to  drink the oral nutrition supplements twice to three times a day as well as take the appropriate vitamins and minerals.  Discussed plan with MD and RN.  Admit weight: 78.5 kg Current weight: 79.7 kg  Meal Completion: 15-100% (no meal completions documented since 6/10)  Medications reviewed and include: vitamin C 500 mg daily, colace, SSI, semglee 12 units daily, Boost Plus TID, IV reglan 10 mg q 8 hours, MVI with minerals daily. Protonix, miralax, senna, warfarin, zinc sulfate 220 mg BID, IV abx  Labs reviewed: sodium 131, BUN 37, creatinine 3.69, WBC 14.7 CBG's: 158-272 x 24 hours  I/O's: +925 ml since admit  Diet Order:   Diet Order             Diet 2 gram sodium Room service appropriate? Yes; Fluid consistency: Thin  Diet effective now                   EDUCATION NEEDS:   Education needs have been addressed  Skin:  Skin Assessment: Skin Integrity Issues: Incisions: L abdomen (closed)  Last BM:  05/21/22 type 6  Height:   Ht Readings from Last 1 Encounters:  05/17/22 5' 10.5" (1.791 m)    Weight:   Wt Readings from Last 1 Encounters:  05/23/22 79.7 kg    BMI:  Body mass index is 24.85 kg/m.  Estimated Nutritional Needs:   Kcal:  2300-2500  Protein:  100-115g  Fluid:  >/=2.3L    Mertie Clause, MS, RD, LDN Inpatient Clinical Dietitian Please see AMiON for contact information.

## 2022-05-23 NOTE — Progress Notes (Signed)
PHARMACY NOTE:  ANTIMICROBIAL RENAL DOSAGE ADJUSTMENT  Current antimicrobial regimen includes a mismatch between antimicrobial dosage and estimated renal function.  As per policy approved by the Pharmacy & Therapeutics and Medical Executive Committees, the antimicrobial dosage will be adjusted accordingly.  Current antimicrobial dosage: Daptomycin 650 mg IV every 24 hours  Indication: MRSA LVAD DLI  Renal Function:  Estimated Creatinine Clearance: 22.1 mL/min (A) (by C-G formula based on SCr of 3.69 mg/dL (H)). []      On intermittent HD, scheduled: []      On CRRT    Antimicrobial dosage has been changed to:  Daptomycin 650 mg IV every 48 hours  Additional comments:   Thank you for allowing pharmacy to be a part of this patient's care.  , PharmD, BCPS Infectious Diseases Clinical Pharmacist 05/23/2022 11:31 AM   **Pharmacist phone directory can now be found on [email or web address] ([email or web address]).  Listed under [facility].

## 2022-05-23 NOTE — Progress Notes (Signed)
Inpatient Diabetes Program Recommendations  AACE/ADA: New Consensus Statement on Inpatient Glycemic Control (2015)  Target Ranges:  Prepandial:   less than 140 mg/dL      Peak postprandial:   less than 180 mg/dL (1-2 hours)      Critically ill patients:  140 - 180 mg/dL   Lab Results  Component Value Date   GLUCAP 353 (H) 05/23/2022   HGBA1C 8.2 (H) 05/13/2022    Review of Glycemic Control  Latest Reference Range & Units 05/22/22 21:03 05/23/22 06:22 05/23/22 11:12  Glucose-Capillary 70 - 99 mg/dL 903 (H) 009 (H) 233 (H)  (H): Data is abnormally high Diabetes history: DM2 Outpatient Diabetes medications: Metformin 1 gm bid Current orders for Inpatient glycemic control: Novolog correction 0-20 units tid, Semglee 12 units QD   Inpatient Diabetes Program Recommendations:   Consider:  -increase Semglee 15 units daily  - Novolog 3 units TID (assuming patient is consuming >50% of meals)   Thanks, Lujean Rave, MSN, RNC-OB Diabetes Coordinator [phone] (8a-5p)

## 2022-05-23 NOTE — Progress Notes (Signed)
Mobility Specialist Progress Note    05/23/22 1131  Mobility  Activity Ambulated with assistance in hallway  Level of Assistance Contact guard assist, steadying assist  Assistive Device Other (Comment) (IV pole)  Distance Ambulated (ft) 420 ft  Activity Response Tolerated well  $Mobility charge 1 Mobility   Pt received in bed and agreeable. No complaints. Returned to sitting EOB with call bell in reach and family present.   [place] Mobility Specialist

## 2022-05-23 NOTE — Progress Notes (Signed)
LVAD Coordinator Rounding Note:  Admitted 05/13/22 to Dr Alford Highland service due to possible sepsis and drive line infection.   HM III LVAD implanted on 11/17/20 by Dr Vickey Sages under destination therapy criteria.    6/5 Admitted with sepsis/driveline infection. Started on vanc and zosyn . Planning for surgical debridement.  6/7 ID consulted.  6/8 Surgical debridement with deep cultures.  Pt laying in bed upon my arrival. Pt states he feels so much better since his antibiotics were changed. He states "I feel like a new person." Pt denies pain or nausea. Pt was able to eat breakfast this morning.  Entresto discontinued.  BP low this morning and >100 PI events. Provided team ordered fluid bolus this morning.   Per ID: Discharge antibiotics to be given via PICC line:  Per pharmacy protocol VANCOMYCIN  Aim for Vancomycin trough 15-20 or AUC 400-550 (unless otherwise indicated) Duration: 4 weeks from surgery  End Date: July 13th   Per Dr Donata Clay pt will need to remain in hospital until next week.   Vital signs: Temp: 97.7 HR: 74 Doppler Pressure: 98 Automatic BP: 114/82 (89) O2 Sat: 96% on RA Wt: 173.1>173.0>175.2>172.6>175.7 lbs    LVAD interrogation reveals:  Speed: 5500 Flow: 4.5 Power: 4.0 w PI: 3.1 Hct: 36  Alarms: none Events: 100 + today  Fixed speed: 5500 Low speed limit: 5200   Drive Line: Existing VAD dressing clean, dry, and intact. Existing VAD dressing removed and site care performed using sterile technique by Dr Donata Clay. Drive line exit site cleaned with Chlora prep applicators x 2, allowed to dry, packed with 2 saline moistened 4 x 4 and covered with several 4 x 4. Dr Zenaida Niece Trigt debride wound today. Small amount of serosanguinous drainage. See picture below. No redness, tenderness, foul odor or rash noted. Drive line anchor re-applied. Dr Donata Clay will change dressing again tomorrow afternoon between cases at bedside.     Labs:  LDH trend:  132>116>127>141>117>124>143>130  INR trend: 2.9>1.9>1.9>2.3>3.8>2.0>2.5  WBC: 17.8 (at [facility])>15.6>11.2>10.9>11.4>12.8>11.8>12.4>15>14.7  Infection: 05/13/22>>Blood cultures>> no growth 4 days; final  05/13/22>>Drive line culture>>rare gram + cocci in pairs & rare gram + rods; final culture few normal skin flora 05/16/22>>Deep DL wound culture in OR>>rare gram + cocci in pairs; abundant staph auerus; final  05/16/22>>Fungus cx from OR>>negative 05/16/22>>AFB cx from OR>>MRSA  Anticoagulation Plan: -INR Goal: 2.0 - 2.5 -ASA Dose: none  Blood Products: 05/14/22>> 2 FFP  Drips:  Heparin- stopped  Plan/Recommendations:  1. Page VAD coordinator for VAD equipment or drive line issues 2. Daily wet to dry dressings with saline solution per Dr. Maren Beach. Pt will need premedication.    Carlton Adam RN VAD Coordinator  Office: [phone]  24/7 Pager: [phone]

## 2022-05-23 NOTE — Progress Notes (Signed)
CT Surgery  Wound debrided and repacked 4x4 saline wet/dry Wound clean, minimal fibrinous exudate Cont daily dressing changes  Blood pressure 114/82, pulse 81, temperature 97.7 F (36.5 C), temperature source Oral, resp. rate 18, height 5' 10.5" (1.791 m), weight 79.7 kg, SpO2 96 %.

## 2022-05-23 NOTE — Progress Notes (Signed)
Patient's dinner blood glucose 63. Two cranberry juices given. Will recheck in 15 mins. Of note, patient was in mid 300s for lunch check, and received 20 units of insulin.   Continue to monitor.

## 2022-05-24 DIAGNOSIS — Z95811 Presence of heart assist device: Secondary | ICD-10-CM | POA: Diagnosis not present

## 2022-05-24 DIAGNOSIS — T827XXA Infection and inflammatory reaction due to other cardiac and vascular devices, implants and grafts, initial encounter: Secondary | ICD-10-CM | POA: Diagnosis not present

## 2022-05-24 DIAGNOSIS — T829XXA Unspecified complication of cardiac and vascular prosthetic device, implant and graft, initial encounter: Secondary | ICD-10-CM | POA: Diagnosis not present

## 2022-05-24 LAB — GLUCOSE, CAPILLARY
Glucose-Capillary: 156 mg/dL — ABNORMAL HIGH (ref 70–99)
Glucose-Capillary: 297 mg/dL — ABNORMAL HIGH (ref 70–99)
Glucose-Capillary: 356 mg/dL — ABNORMAL HIGH (ref 70–99)
Glucose-Capillary: 130 mg/dL — ABNORMAL HIGH (ref 70–99)
Glucose-Capillary: 111 mg/dL — ABNORMAL HIGH (ref 70–99)

## 2022-05-24 LAB — CBC
HCT: 29.6 % — ABNORMAL LOW (ref 39.0–52.0)
Hemoglobin: 9.8 g/dL — ABNORMAL LOW (ref 13.0–17.0)
MCH: 26.7 pg (ref 26.0–34.0)
MCHC: 33.1 g/dL (ref 30.0–36.0)
MCV: 80.7 fL (ref 80.0–100.0)
Platelets: 265 10*3/uL (ref 150–400)
RBC: 3.67 MIL/uL — ABNORMAL LOW (ref 4.22–5.81)
RDW: 15.7 % — ABNORMAL HIGH (ref 11.5–15.5)
WBC: 13.7 10*3/uL — ABNORMAL HIGH (ref 4.0–10.5)
nRBC: 0 % (ref 0.0–0.2)

## 2022-05-24 LAB — LACTATE DEHYDROGENASE: LDH: 153 U/L (ref 98–192)

## 2022-05-24 LAB — BASIC METABOLIC PANEL
Anion gap: 11 (ref 5–15)
BUN: 49 mg/dL — ABNORMAL HIGH (ref 8–23)
CO2: 26 mmol/L (ref 22–32)
Calcium: 8.3 mg/dL — ABNORMAL LOW (ref 8.9–10.3)
Chloride: 94 mmol/L — ABNORMAL LOW (ref 98–111)
Creatinine, Ser: 4.28 mg/dL — ABNORMAL HIGH (ref 0.61–1.24)
GFR, Estimated: 15 mL/min — ABNORMAL LOW (ref >60)
Glucose, Bld: 182 mg/dL — ABNORMAL HIGH (ref 70–99)
Potassium: 4.5 mmol/L (ref 3.5–5.1)
Sodium: 131 mmol/L — ABNORMAL LOW (ref 135–145)

## 2022-05-24 LAB — RAPID URINE DRUG SCREEN, HOSP PERFORMED
Amphetamines: NOT DETECTED
Barbiturates: NOT DETECTED
Benzodiazepines: NOT DETECTED
Cocaine: NOT DETECTED
Opiates: NOT DETECTED
Tetrahydrocannabinol: NOT DETECTED

## 2022-05-24 LAB — PROTIME-INR
INR: 2.4 — ABNORMAL HIGH (ref 0.8–1.2)
Prothrombin Time: 26 seconds — ABNORMAL HIGH (ref 11.4–15.2)

## 2022-05-24 LAB — MAGNESIUM: Magnesium: 2 mg/dL (ref 1.7–2.4)

## 2022-05-24 MED ORDER — INSULIN ASPART 100 UNIT/ML IJ SOLN
0.0000 [IU] | Freq: Three times a day (TID) | INTRAMUSCULAR | Status: DC
  Administered 2022-05-24: 1 [IU] via SUBCUTANEOUS
  Administered 2022-05-25: 2 [IU] via SUBCUTANEOUS
  Administered 2022-05-25: 9 [IU] via SUBCUTANEOUS
  Administered 2022-05-25 – 2022-05-26 (×2): 2 [IU] via SUBCUTANEOUS

## 2022-05-24 MED ORDER — WARFARIN SODIUM 3 MG PO TABS
3.0000 mg | ORAL_TABLET | Freq: Once | ORAL | Status: AC
  Administered 2022-05-24: 3 mg via ORAL
  Filled 2022-05-24: qty 1

## 2022-05-24 MED ORDER — WARFARIN SODIUM 4 MG PO TABS: 4.0000 mg | ORAL_TABLET | Freq: Once | ORAL | Status: DC

## 2022-05-24 NOTE — Progress Notes (Addendum)
Patient ID: DOLORES MCGOVERN, male   DOB: 1961-08-23, 61 y.o.   MRN: [id number]     Advanced Heart Failure Rounding Note  PCP-Cardiologist: Nona Dell, MD   Subjective:    6/5 Admitted with sepsis/driveline infection. Started on vanc and zosyn . Planning for surgical debridement.  6/7 ID consulted.  6/8 Surgical debridement with deep cultures. -> Wound cx with MRSA 6/14 Worsening renal function. Entresto stopped.  6/15 Worsening renal function. Given 1 liters LR. Renal US normal. Hydralazine stopped.   ID switched Vancomycin to Dapto . Nausea resolved.   WBC 15>14.7>13.7  INR 2.4  Creatinine 0.9>1.6, 3.22>3.7>3.9>4.3  BUN trending up. Made >1125 cc urine.   Feel better. Hungry. Denies SOB. No pain.    LVAD Interrogation HM III: Speed: 5500 Flow: 4.1 PI: 3.1   Power: 4  10 PI events.     Objective:   Weight Range: 82.9 kg Body mass index is 25.85 kg/m.   Vital Signs:   Temp:  [97.7 F (36.5 C)-98.7 F (37.1 C)] 98.3 F (36.8 C) (06/16 0359) Pulse Rate:  [69-81] 81 (06/16 0400) Resp:  [12-18] 16 (06/16 0400) BP: (78-130)/(54-89) 97/73 (06/16 0400) SpO2:  [96 %-98 %] 98 % (06/16 0359) Weight:  [82.9 kg] 82.9 kg (06/16 0359) Last BM Date : 05/23/22  Weight change: Filed Weights   05/22/22 0452 05/23/22 0625 05/24/22 0359  Weight: 78.3 kg 79.7 kg 82.9 kg    Intake/Output:   Intake/Output Summary (Last 24 hours) at 05/24/2022 0728 Last data filed at 05/24/2022 0405 Gross per 24 hour  Intake 1260 ml  Output 1125 ml  Net 135 ml     Maps 80s Physical Exam   Physical Exam: GENERAL: Sitting on the side of the bed. No acute distress. HEENT: normal  NECK: Supple, JVP 5-6   .  2+ bilaterally, no bruits.  No lymphadenopathy or thyromegaly appreciated.   CARDIAC:  Mechanical heart sounds with LVAD hum present.  LUNGS:  Clear to auscultation bilaterally.  ABDOMEN:  Soft, round, nontender, positive bowel sounds x4.     LVAD exit site: Dressing dry and intact.   No erythema or drainage.  Stabilization device present and accurately applied.   EXTREMITIES:  Warm and dry, no cyanosis, clubbing, rash or edema. RUE PICC  NEUROLOGIC:  Alert and oriented x 3.    No aphasia.  No dysarthria.  Affect pleasant.        Telemetry   SR 70-80s.   Labs    CBC Recent Labs    05/23/22 0344 05/24/22 0430  WBC 14.7* 13.7*  HGB 11.6* 9.8*  HCT 35.7* 29.6*  MCV 81.1 80.7  PLT 302 265   Basic Metabolic Panel Recent Labs    71/69/67 0344 05/23/22 0810 05/23/22 1716 05/24/22 0430  NA 134*   < > 131* 131*  K 4.3   < > 4.2 4.5  CL 97*   < > 94* 94*  CO2 26   < > 24 26  GLUCOSE 146*   < > 164* 182*  BUN 34*   < > 42* 49*  CREATININE 3.22*   < > 3.95* 4.28*  CALCIUM 8.5*   < > 8.4* 8.3*  MG 2.0  --   --  2.0   < > = values in this interval not displayed.   Liver Function Tests No results for input(s): "AST", "ALT", "ALKPHOS", "BILITOT", "PROT", "ALBUMIN" in the last 72 hours.  No results for input(s): "LIPASE", "AMYLASE" in the last  72 hours. Cardiac Enzymes Recent Labs    05/23/22 0344  CKTOTAL 27*    BNP: BNP (last 3 results) Recent Labs    05/17/22 0153  BNP 222.1*    ProBNP (last 3 results) No results for input(s): "PROBNP" in the last 8760 hours.   D-Dimer No results for input(s): "DDIMER" in the last 72 hours. Hemoglobin A1C No results for input(s): "HGBA1C" in the last 72 hours.  Fasting Lipid Panel No results for input(s): "CHOL", "HDL", "LDLCALC", "TRIG", "CHOLHDL", "LDLDIRECT" in the last 72 hours. Thyroid Function Tests No results for input(s): "TSH", "T4TOTAL", "T3FREE", "THYROIDAB" in the last 72 hours.  Invalid input(s): "FREET3"   Other results:   Imaging    US RENAL  Result Date: 05/23/2022 CLINICAL DATA:  Acute kidney injury EXAM: RENAL / URINARY TRACT ULTRASOUND COMPLETE COMPARISON:  None Available. FINDINGS: Right Kidney: Renal measurements: 12.8 x 6.2 x 5.9 cm = volume: 245 mL. Echogenicity within  normal limits. No mass or hydronephrosis visualized. Left Kidney: Renal measurements: 12.9 x 8.1 x 6.0 cm = volume: 328 mL. Echogenicity within normal limits. No mass or hydronephrosis visualized. Bladder: Appears normal for degree of bladder distention. Other: None. IMPRESSION: Negative. Electronically Signed   By: Charlett Nose M.D.   On: 05/23/2022 23:15     Medications:     Scheduled Medications:  vitamin C  500 mg Oral Daily   Chlorhexidine Gluconate Cloth  6 each Topical Daily   docusate sodium  100 mg Oral Daily   fluticasone  1 spray Each Nare Daily   gabapentin  300 mg Oral BID   guaiFENesin  600 mg Oral BID   insulin aspart  0-20 Units Subcutaneous TID WC   insulin aspart  3 Units Subcutaneous TID WC   insulin glargine-yfgn  15 Units Subcutaneous Daily   lactose free nutrition  237 mL Oral TID WC   lidocaine  1 patch Transdermal Q24H   loratadine  10 mg Oral Daily   methimazole  20 mg Oral BID   metoCLOPramide (REGLAN) injection  10 mg Intravenous Q8H   multivitamin with minerals  1 tablet Oral Daily   pantoprazole  40 mg Oral Daily   polyethylene glycol  17 g Oral BID   senna-docusate  2 tablet Oral Daily   sertraline  50 mg Oral QHS   sodium chloride flush  10-40 mL Intracatheter Q12H   sodium chloride flush  3 mL Intravenous Q12H   traZODone  150 mg Oral QHS   Warfarin - Pharmacist Dosing Inpatient   Does not apply q1600   zinc sulfate  220 mg Oral BID    Infusions:  sodium chloride Stopped (05/18/22 1622)   DAPTOmycin (CUBICIN) 650 mg in sodium chloride 0.9 % IVPB      PRN Medications: sodium chloride, acetaminophen, ipratropium-albuterol, ondansetron (ZOFRAN) IV, mouth rinse, oxyCODONE, sodium chloride flush, sodium chloride flush, sodium hypochlorite    Patient Profile   Mr Capobianco is a 61 year old with h/o HFrEF, HMIII LVAD, CAD, S/P CABG 2020 with Maze/LAA clipping, DMII, COPD, HTN, PAF, and previous ETOH abuse.    Admitted after syncope/driveline  infection.  Assessment/Plan   1. ID: Driveline infection.  CT abdomen/pelvis with small fluid collection along pre-peritoneal driveline.  Early sepsis. Lactate initially elevated.  MAP improved with IV fluid, creatinine lower. . S/p driveline site debridement 6/8. Surgical wound cultures growing MRSA  Blood Cx NGTD.  - AF today. WBCs 12.4 >15 > 14.7>13.7  - ID following.Vanc switched  to Dapto with completion day July 13 th. OPAT orders placed. PICC placed - HH orders placed.  TOC CM following for home antibiotics.  - Prealbumin 15. Dietitian appreciated. Started on supplements. Appetite imprvoed.  2.  Chronic systolic CHF: Echo 10/21 with EF 20-25%, mildly decreased RV function. LHC/RHC in 12/21 with patent grafts, low output. Suspect mixed ischemic/nonischemic cardiomyopathy (prior heavy ETOH and drugs as well as CAD).  No ETOH, drugs, smoking since CABG in 12/20. Admitted with cardiogenic shock in 12/21, had placement of Impella 5.5 initially, now s/p Heartmate 3 LVAD on 11/17/20.  Ramp echo 2/23 with speed decreased to 5500 rpm.  NYHA class II - Volume status stable. Off hydralazine and entresto with AKI. Maps ok.  - INR 2.4. Goal 2-2.5. Warfarin dosing per PharmD.  - LDH stable 153 3. CAD: S/p CABG 12/20.  LHC pre-VAD with patent grafts, no target for intervention. - No s/s angina  - Continue statin.  3. Atrial fibrillation: Paroxysmal.  S/p Maze and LA appendage clip with CABG in 12/20. Maintaining Sr.  - Off amiodarone with hyperthyroidism. - Coumadin as above 5. Type 2 diabetes: SSI. Running higher. Consulted diabetes coordinator. Semglee added. 6. Gout:   - Continue allopurinol 100 mg daily.  7. PACs/PVCs  8. COPD: H/o COPD exacerbation, overall feels better on Trelegy.  - Followup with pulmonary.  9. 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  He missed his endocrinology appt.  - He is now off amiodarone.   - Continue methimazole 20 mg bid, TSH was mildly elevated.   - Will  need to reschedule endocrine appt.   10. Nausea: Resolved after stopping vancomycin.  11. AKI  - Creatinine trending up 0.9>1.6>3.2>3.7>4.28. Repeat BMET at lunch.  - Off entresto and hydralazine.  - Renal US negative.  - Making > 1125 cc urine -May need nephrology consult.   12. Moderate Malnutrition -Prealbumin 15. -Dietitian consulted. Started on supplements. Diet liberalized.  - Vit C , MVI, and Zinc started.    Length of Stay: 11  Amy Clegg, NP  05/24/2022, 7:28 AM  Advanced Heart Failure Team Pager [phone] (M-F; 7a - 5p)  Please contact [facility] for night-coverage after hours (5p -7a ) and weekends on [email or web address]   Patient seen and examined with the above-signed Advanced Practice Provider and/or Housestaff. I personally reviewed laboratory data, imaging studies and relevant notes. I independently examined the patient and formulated the important aspects of the plan. I have edited the note to reflect any of my changes or salient points. I have personally discussed the plan with the patient and/or family.  Feels good today. Nausea resolved after switching abx. Scr continues to climb.   MAPs ok. Renal u/s ok. UA unremarkable except for small amount of protein.   Making good urine. No uremic symptoms  General:  NAD.  HEENT: normal  Neck: supple. JVP not elevated.  Carotids 2+ bilat; no bruits. No lymphadenopathy or thryomegaly appreciated. Cor: LVAD hum.  Lungs: Clear. Abdomen: obese soft, nontender, non-distended. No hepatosplenomegaly. No bruits or masses. Good bowel sounds. Driveline site clean. Anchor in place.  Extremities: no cyanosis, clubbing, rash. Warm no edema  Neuro: alert & oriented x 3. No focal deficits. Moves all 4 without problem   Remains on abx. Has PICC in place. Wound debrided again yesterday. Improving slowly.   Now with AKI. Suspect ATN due to low BP at some point. He has been hydrated. MAPs ok. Renal u/s and UA ok. Making urine. Will continue  to follow. If not improving will need Renal to see.   VAD interrogated personally. Parameters stable.  Arvilla Meres, MD  8:09 AM

## 2022-05-24 NOTE — Progress Notes (Signed)
LVAD Coordinator Rounding Note:  Admitted 05/13/22 to Dr Alford Highland service due to possible sepsis and drive line infection.   HM III LVAD implanted on 11/17/20 by Dr Vickey Sages under destination therapy criteria.    6/5 Admitted with sepsis/driveline infection. Started on vanc and zosyn . Planning for surgical debridement.  6/7 ID consulted.  6/8 Surgical debridement with deep cultures.  Pt laying in bed upon my arrival. Pt states he feels so much better since his antibiotics were changed. He states "I feel like a new person." Pt denies pain or nausea.   Pt admits today to meth use and is asking for assistance with drug rehab. Annice Pih our Child psychotherapist is aware. Provider team placed UDS order.  CR 4.28 this morning, pt is making urine.   Per ID: Discharge antibiotics to be given via PICC line:  Per pharmacy protocol VANCOMYCIN  Aim for Vancomycin trough 15-20 or AUC 400-550 (unless otherwise indicated) Duration: 4 weeks from surgery  End Date: July 13th   Per Dr Donata Clay pt will need to remain in hospital until next week.   Vital signs: Temp: 97.9 HR: 81 Doppler Pressure: 129 Automatic BP: 129/85 (97) O2 Sat: 98% on RA Wt: 173.1>173.0>175.2>172.6>175.7>182.7 lbs    LVAD interrogation reveals:  Speed: 5500 Flow: 3.6 Power: 4.1 w PI: 5.4 Hct: 29  Alarms: none Events: 10 today  Fixed speed: 5500 Low speed limit: 5200   Drive Line: Existing VAD dressing clean, dry, and intact. Existing VAD dressing removed and site care performed using sterile technique by Dr Donata Clay today at bedside - see PVT note for details.   Labs:  LDH trend: 132>116>127>141>117>124>143>130>153  INR trend: 2.9>1.9>1.9>2.3>3.8>2.0>2.5>2.4  WBC: 17.8 (at [facility])>15.6>11.2>10.9>11.4>12.8>11.8>12.4>15>14.7>13.7  Infection: 05/13/22>>Blood cultures>> no growth 4 days; final  05/13/22>>Drive line culture>>rare gram + cocci in pairs & rare gram + rods; final culture few normal skin  flora 05/16/22>>Deep DL wound culture in OR>>rare gram + cocci in pairs; abundant staph auerus; final  05/16/22>>Fungus cx from OR>>negative 05/16/22>>AFB cx from OR>>MRSA  Anticoagulation Plan: -INR Goal: 2.0 - 2.5 -ASA Dose: none  Blood Products: 05/14/22>> 2 FFP  Drips:  Heparin- stopped  Plan/Recommendations:  1. Page VAD coordinator for VAD equipment or drive line issues 2. Daily wet to dry dressings with saline solution per Dr. Maren Beach. Pt will need premedication.    Carlton Adam RN VAD Coordinator  Office: [phone]  24/7 Pager: [phone]

## 2022-05-24 NOTE — Plan of Care (Signed)
  Problem: Education: Goal: Knowledge of General Education information will improve Description: Including pain rating scale, medication(s)/side effects and non-pharmacologic comfort measures Outcome: Progressing   Problem: Health Behavior/Discharge Planning: Goal: Ability to manage health-related needs will improve Outcome: Progressing   Problem: Clinical Measurements: Goal: Ability to maintain clinical measurements within normal limits will improve Outcome: Progressing Goal: Respiratory complications will improve Outcome: Progressing   Problem: Activity: Goal: Risk for activity intolerance will decrease Outcome: Progressing   Problem: Nutrition: Goal: Adequate nutrition will be maintained Outcome: Progressing   Problem: Pain Managment: Goal: General experience of comfort will improve Outcome: Progressing   Problem: Skin Integrity: Goal: Risk for impaired skin integrity will decrease Outcome: Progressing

## 2022-05-24 NOTE — Progress Notes (Signed)
Mobility Specialist: Progress Note   05/24/22 1308  Mobility  Activity Refused mobility   Attempted to see pt x2 today. Pt refused mobility stating he doesn't feel up to it right now. Will f/u as able. Encouraged pt to ambulate later today.   [facility] Jozey Janco Mobility Specialist Mobility Specialist 4 East: [phone]

## 2022-05-24 NOTE — Progress Notes (Signed)
   Mr Grudzien asking for help to stop using meth.   He tells me he smokes meth 3-5 times a week. Nursing staff reported behavior change yesterday after his wife left.    Check UDS now.   Refer to VAD SW team to assist with rehab.   Rabiah Goeser NP-C  8:14 AM

## 2022-05-24 NOTE — Progress Notes (Signed)
CT surgery  Patient's driveline wound dressing personally changed and inspected.  It remains clean and granulation tissue is starting to form at its base. The best course going forward is to treat with daily wet-to-dry dressings.  I believe that with some basic instructions the patient's wife will be able to perform the daily dressing changes at home and preparations should be made for discharge and outpatient care by mid part of next week.  Patient's nutritional intake has significantly improved.  Blood pressure (!) 144/93, pulse 80, temperature 98.8 F (37.1 C), temperature source Oral, resp. rate 19, height 5' 10.5" (1.791 m), weight 82.9 kg, SpO2 96 %.

## 2022-05-24 NOTE — Progress Notes (Signed)
ANTICOAGULATION CONSULT NOTE   Pharmacy Consult for Warfarin Indication:  LVAD  No Known Allergies  Patient Measurements: Height: 5' 10.5" (179.1 cm) Weight: 82.9 kg (182 lb 12.2 oz) IBW/kg (Calculated) : 74.15   Vital Signs: Temp: 98.8 F (37.1 C) (06/16 1301) Temp Source: Oral (06/16 1301) BP: 144/93 (06/16 1302) Pulse Rate: 80 (06/16 1301)  Labs: Recent Labs    05/22/22 0447 05/23/22 0344 05/23/22 0810 05/23/22 1716 05/24/22 0430  HGB 12.5* 11.6*  --   --  9.8*  HCT 38.2* 35.7*  --   --  29.6*  PLT 331 302  --   --  265  LABPROT 22.5* 26.6*  --   --  26.0*  INR 2.0* 2.5*  --   --  2.4*  CREATININE 1.63* 3.22* 3.69* 3.95* 4.28*  CKTOTAL  --  27*  --   --   --      Estimated Creatinine Clearance: 19 mL/min (A) (by C-G formula based on SCr of 4.28 mg/dL (H)).   Medical History: Past Medical History:  Diagnosis Date   "    Arthritis    CAD (coronary artery disease)    a. s/p CABG in 11/2019 with LIMA-LAD, SVG-OM1, SVG-PDA and SVG-D1   CHF (congestive heart failure) (HCC)    a. EF < 20% by echo in 11/2019   Essential hypertension    PAF (paroxysmal atrial fibrillation) (HCC)    Type 2 diabetes mellitus (HCC)     Assessment: 61yom with LVAD HM3 implanted 2021, admitted for drive line infection.  INR 2.6 on admit- no bleeding, h/h stable   S/p debridement and surgery ok to resume warfarin 6/8 with no heparin.   INR 2.4 this AM - keeping on low end of goal with possible I&D in future.  No bleeding issues or oozing from wound site. Hemoglobin down slightly from 12.5>11.6.   Of note patient on boost nutritional supplements to help promote wound healing.    PTA warfarin 4mg  TTSS / 6mg  MWF  Goal of Therapy:  INR 2-2.5 Monitor platelets by anticoagulation protocol: Yes   Plan:  Warfarin 3 mg tonight Daily INR  , , [facility] Clinical Pharmacist  05/24/2022 1:51 PM   [facility] pharmacy phone numbers are listed on [email or web address]

## 2022-05-24 NOTE — Progress Notes (Signed)
Inpatient Diabetes Program Recommendations  AACE/ADA: New Consensus Statement on Inpatient Glycemic Control (2015)  Target Ranges:  Prepandial:   less than 140 mg/dL      Peak postprandial:   less than 180 mg/dL (1-2 hours)      Critically ill patients:  140 - 180 mg/dL   Lab Results  Component Value Date   GLUCAP 156 (H) 05/24/2022   HGBA1C 8.2 (H) 05/13/2022    Review of Glycemic Control  Latest Reference Range & Units 05/23/22 11:12 05/23/22 16:34 05/23/22 17:00 05/23/22 21:13 05/24/22 06:09  Glucose-Capillary 70 - 99 mg/dL 408 (H) 63 (L) 144 (H) 198 (H) 156 (H)  (H): Data is abnormally high (L): Data is abnormally low Diabetes history: DM2 Outpatient Diabetes medications: Metformin 1 gm bid Current orders for Inpatient glycemic control: Novolog correction 0-20 units tid, Semglee 15 units QD, Novolog 3 units TID   Inpatient Diabetes Program Recommendations:    With increasing creatinine, may want to consider reducing correction to Novolog 0-9 units TID.   Thanks, Lujean Rave, MSN, RNC-OB Diabetes Coordinator [phone] (8a-5p)

## 2022-05-25 DIAGNOSIS — N179 Acute kidney failure, unspecified: Secondary | ICD-10-CM | POA: Diagnosis not present

## 2022-05-25 DIAGNOSIS — T829XXA Unspecified complication of cardiac and vascular prosthetic device, implant and graft, initial encounter: Secondary | ICD-10-CM | POA: Diagnosis not present

## 2022-05-25 DIAGNOSIS — T827XXA Infection and inflammatory reaction due to other cardiac and vascular devices, implants and grafts, initial encounter: Secondary | ICD-10-CM | POA: Diagnosis not present

## 2022-05-25 DIAGNOSIS — Z95811 Presence of heart assist device: Secondary | ICD-10-CM | POA: Diagnosis not present

## 2022-05-25 LAB — BASIC METABOLIC PANEL
Anion gap: 13 (ref 5–15)
BUN: 56 mg/dL — ABNORMAL HIGH (ref 8–23)
CO2: 24 mmol/L (ref 22–32)
Calcium: 8.6 mg/dL — ABNORMAL LOW (ref 8.9–10.3)
Chloride: 95 mmol/L — ABNORMAL LOW (ref 98–111)
Creatinine, Ser: 4.63 mg/dL — ABNORMAL HIGH (ref 0.61–1.24)
GFR, Estimated: 14 mL/min — ABNORMAL LOW (ref >60)
Glucose, Bld: 76 mg/dL (ref 70–99)
Potassium: 4.5 mmol/L (ref 3.5–5.1)
Sodium: 132 mmol/L — ABNORMAL LOW (ref 135–145)

## 2022-05-25 LAB — CBC
HCT: 31.9 % — ABNORMAL LOW (ref 39.0–52.0)
Hemoglobin: 10.3 g/dL — ABNORMAL LOW (ref 13.0–17.0)
MCH: 26.5 pg (ref 26.0–34.0)
MCHC: 32.3 g/dL (ref 30.0–36.0)
MCV: 82 fL (ref 80.0–100.0)
Platelets: 318 10*3/uL (ref 150–400)
RBC: 3.89 MIL/uL — ABNORMAL LOW (ref 4.22–5.81)
RDW: 15.9 % — ABNORMAL HIGH (ref 11.5–15.5)
WBC: 19.6 10*3/uL — ABNORMAL HIGH (ref 4.0–10.5)
nRBC: 0 % (ref 0.0–0.2)

## 2022-05-25 LAB — GLUCOSE, CAPILLARY
Glucose-Capillary: 158 mg/dL — ABNORMAL HIGH (ref 70–99)
Glucose-Capillary: 336 mg/dL — ABNORMAL HIGH (ref 70–99)
Glucose-Capillary: 171 mg/dL — ABNORMAL HIGH (ref 70–99)
Glucose-Capillary: 460 mg/dL — ABNORMAL HIGH (ref 70–99)

## 2022-05-25 LAB — HEPATIC FUNCTION PANEL
ALT: 49 U/L — ABNORMAL HIGH (ref 0–44)
AST: 35 U/L (ref 15–41)
Albumin: 2.6 g/dL — ABNORMAL LOW (ref 3.5–5.0)
Alkaline Phosphatase: 90 U/L (ref 38–126)
Bilirubin, Direct: <0.1 mg/dL (ref 0.0–0.2)
Total Bilirubin: 0.3 mg/dL (ref 0.3–1.2)
Total Protein: 6.4 g/dL — ABNORMAL LOW (ref 6.5–8.1)

## 2022-05-25 LAB — MAGNESIUM: Magnesium: 2.3 mg/dL (ref 1.7–2.4)

## 2022-05-25 LAB — URIC ACID: Uric Acid, Serum: 4.8 mg/dL (ref 3.7–8.6)

## 2022-05-25 LAB — PROTIME-INR
INR: 2.1 — ABNORMAL HIGH (ref 0.8–1.2)
Prothrombin Time: 23.2 seconds — ABNORMAL HIGH (ref 11.4–15.2)

## 2022-05-25 LAB — LACTATE DEHYDROGENASE: LDH: 173 U/L (ref 98–192)

## 2022-05-25 MED ORDER — INSULIN ASPART 100 UNIT/ML IJ SOLN
0.0000 [IU] | Freq: Every day | INTRAMUSCULAR | Status: DC
  Administered 2022-05-25: 5 [IU] via SUBCUTANEOUS
  Administered 2022-05-26: 2 [IU] via SUBCUTANEOUS
  Administered 2022-05-27: 3 [IU] via SUBCUTANEOUS
  Administered 2022-05-28: 2 [IU] via SUBCUTANEOUS
  Administered 2022-05-29: 3 [IU] via SUBCUTANEOUS
  Administered 2022-05-30: 5 [IU] via SUBCUTANEOUS

## 2022-05-25 MED ORDER — WARFARIN SODIUM 5 MG PO TABS: 5.0000 mg | ORAL_TABLET | Freq: Once | ORAL | Status: DC

## 2022-05-25 MED ORDER — HYDRALAZINE HCL 25 MG PO TABS
25.0000 mg | ORAL_TABLET | Freq: Three times a day (TID) | ORAL | Status: DC
  Administered 2022-05-25 – 2022-05-27 (×7): 25 mg via ORAL
  Filled 2022-05-25 (×7): qty 1

## 2022-05-25 MED ORDER — PREDNISONE 20 MG PO TABS
40.0000 mg | ORAL_TABLET | Freq: Every day | ORAL | Status: AC
  Administered 2022-05-25 – 2022-05-27 (×3): 40 mg via ORAL
  Filled 2022-05-25 (×3): qty 2

## 2022-05-25 MED ORDER — WARFARIN SODIUM 4 MG PO TABS
4.0000 mg | ORAL_TABLET | Freq: Once | ORAL | Status: AC
  Administered 2022-05-25: 4 mg via ORAL
  Filled 2022-05-25: qty 1

## 2022-05-25 NOTE — Progress Notes (Signed)
Mobility Specialist: Progress Note   05/25/22 1600  Mobility  Activity Ambulated independently in hallway  Level of Assistance Standby assist, set-up cues, supervision of patient - no hands on  Assistive Device None  Distance Ambulated (ft) 300 ft  Activity Response Tolerated well  $Mobility charge 1 Mobility   Received pt in bed having no complaints and agreeable to mobility. Pt was asymptomatic throughout ambulation and returned to room w/o fault. Left sitting EOB w/ call bell in reach and all needs met.  [facility] Hady Niemczyk Mobility Specialist Mobility Specialist 4 East: [phone]

## 2022-05-25 NOTE — Progress Notes (Signed)
9 Days Post-Op Procedure(s) (LRB): VAD DRIVELINE DEBRIDEMENT (N/A) Subjective: Driveline exit site dressing changed and wound inspected. Considerable improvement almost daily with the granulation tissue development of the wound.  The wife can reliably packed the wound every day he should be able to be followed as an outpatient by midweek.  Objective: Vital signs in last 24 hours: Temp:  [97.8 F (36.6 C)-98.8 F (37.1 C)] 97.9 F (36.6 C) (06/17 1142) Pulse Rate:  [79-84] 84 (06/17 1142) Cardiac Rhythm: Normal sinus rhythm (06/17 0702) Resp:  [12-19] 18 (06/17 1142) BP: (133-147)/(83-100) 145/89 (06/17 1142) SpO2:  [91 %-98 %] 91 % (06/17 1142) Weight:  [84.9 kg] 84.9 kg (06/17 0500)  Hemodynamic parameters for last 24 hours:    Intake/Output from previous day: 06/16 0701 - 06/17 0700 In: 783 [P.O.:720; IV Piggyback:63] Out: 1659 [Urine:1659] Intake/Output this shift: No intake/output data recorded.    Lab Results: Recent Labs    05/24/22 0430 05/25/22 0342  WBC 13.7* 19.6*  HGB 9.8* 10.3*  HCT 29.6* 31.9*  PLT 265 318   BMET:  Recent Labs    05/24/22 0430 05/25/22 0342  NA 131* 132*  K 4.5 4.5  CL 94* 95*  CO2 26 24  GLUCOSE 182* 76  BUN 49* 56*  CREATININE 4.28* 4.63*  CALCIUM 8.3* 8.6*    PT/INR:  Recent Labs    05/25/22 0342  LABPROT 23.2*  INR 2.1*   ABG    Component Value Date/Time   PHART 7.508 (H) 11/20/2020 0438   HCO3 24.8 09/09/2021 0313   TCO2 29 11/20/2020 0438   ACIDBASEDEF 6.0 (H) 11/24/2019 1718   O2SAT 63.4 09/09/2021 0313   CBG (last 3)  Recent Labs    05/24/22 2112 05/25/22 0609 05/25/22 1139  GLUCAP 111* 158* 336*    Assessment/Plan: S/P Procedure(s) (LRB): VAD DRIVELINE DEBRIDEMENT (N/A) Wound with greater than 60% granulation tissue.  Mainly exposed healthy rectus muscle and posterior rectus sheath with minimal fibrinous exudate.  Continue daily wet-to-dry dressings   LOS: 12 days    Lovett Sox 05/25/2022

## 2022-05-25 NOTE — Progress Notes (Signed)
ANTICOAGULATION CONSULT NOTE   Pharmacy Consult for Warfarin Indication:  LVAD  No Known Allergies  Patient Measurements: Height: 5' 10.5" (179.1 cm) Weight: 84.9 kg (187 lb 2.7 oz) IBW/kg (Calculated) : 74.15   Vital Signs: Temp: 97.9 F (36.6 C) (06/17 1142) Temp Source: Oral (06/17 1142) BP: 145/89 (06/17 1142) Pulse Rate: 84 (06/17 1142)  Labs: Recent Labs    05/23/22 0344 05/23/22 0810 05/23/22 1716 05/24/22 0430 05/25/22 0342  HGB 11.6*  --   --  9.8* 10.3*  HCT 35.7*  --   --  29.6* 31.9*  PLT 302  --   --  265 318  LABPROT 26.6*  --   --  26.0* 23.2*  INR 2.5*  --   --  2.4* 2.1*  CREATININE 3.22*   < > 3.95* 4.28* 4.63*  CKTOTAL 27*  --   --   --   --    < > = values in this interval not displayed.     Estimated Creatinine Clearance: 17.6 mL/min (A) (by C-G formula based on SCr of 4.63 mg/dL (H)).   Medical History: Past Medical History:  Diagnosis Date   "    Arthritis    CAD (coronary artery disease)    a. s/p CABG in 11/2019 with LIMA-LAD, SVG-OM1, SVG-PDA and SVG-D1   CHF (congestive heart failure) (HCC)    a. EF < 20% by echo in 11/2019   Essential hypertension    PAF (paroxysmal atrial fibrillation) (HCC)    Type 2 diabetes mellitus (HCC)     Assessment: 61yom with LVAD HM3 implanted 2021, admitted for drive line infection.  INR 2.6 on admit- no bleeding, h/h stable   S/p debridement and surgery ok to resume warfarin 6/8 with no heparin.   INR 2.1 this AM - keeping on low end of goal with possible I&D in future.  No bleeding issues or oozing from wound site. Hemoglobin down slightly from 12.5>11.6>10.3. Pltc ok.  Of note patient on boost nutritional supplements to help promote wound healing.    PTA warfarin 4mg  TTSS / 6mg  MWF  Goal of Therapy:  INR 2-2.5 Monitor platelets by anticoagulation protocol: Yes   Plan:  Warfarin 4 mg tonight Daily INR  , , [facility] Clinical Pharmacist  05/25/2022 2:59 PM   [facility]  pharmacy phone numbers are listed on [email or web address]

## 2022-05-25 NOTE — Progress Notes (Addendum)
Patient ID: Kevin Underwood, male   DOB: Sep 26, 1961, 61 y.o.   MRN: [id number]     Advanced Heart Failure Rounding Note  PCP-Cardiologist: Nona Dell, MD   Subjective:    6/5 Admitted with sepsis/driveline infection. Started on vanc and zosyn . Planning for surgical debridement.  6/7 ID consulted.  6/8 Surgical debridement with deep cultures. -> Wound cx with MRSA 6/14 Worsening renal function. Entresto stopped.  6/15 Worsening renal function. Given 1 liters LR. Renal US normal. Hydralazine stopped.   ID switched Vancomycin to Dapto . Nausea resolved. + left wrist pain  Feels good. Denies SOB, orthopnea or PND. WBC jumped 13 -> 19k  Scr continues to climb. Now 4.6 Still making urine No uremic symptoms. No SOB< orthopnea  Wound healing slowly with wet-to-dry dressings.   LVAD Interrogation HM III: Speed: 5500 Flow: 3.8 PI: 5.o   Power: 4.0    Objective:   Weight Range: 82.9 kg Body mass index is 25.85 kg/m.   Vital Signs:   Temp:  [97.7 F (36.5 C)-98.7 F (37.1 C)] 98.3 F (36.8 C) (06/16 0359) Pulse Rate:  [69-81] 81 (06/16 0400) Resp:  [12-18] 16 (06/16 0400) BP: (78-130)/(54-89) 97/73 (06/16 0400) SpO2:  [96 %-98 %] 98 % (06/16 0359) Weight:  [82.9 kg] 82.9 kg (06/16 0359) Last BM Date : 05/23/22  Weight change: Filed Weights   05/22/22 0452 05/23/22 0625 05/24/22 0359  Weight: 78.3 kg 79.7 kg 82.9 kg    Intake/Output:   Intake/Output Summary (Last 24 hours) at 05/24/2022 0728 Last data filed at 05/24/2022 0405 Gross per 24 hour  Intake 1260 ml  Output 1125 ml  Net 135 ml      Physical Exam    MAPs ~100  Physical Exam: General:  NAD.  HEENT: normal  Neck: supple. JVP not elevated.  Carotids 2+ bilat; no bruits. No lymphadenopathy or thryomegaly appreciated. Cor: LVAD hum.  Lungs: Clear. Abdomen: obese soft, nontender, non-distended. No hepatosplenomegaly. No bruits or masses. Good bowel sounds. Driveline site with clean dressingAnchor in  place.  Extremities: no cyanosis, clubbing, rash. Warm no edema  L wrist tender to palpation and with ROM  Neuro: alert & oriented x 3. No focal deficits. Moves all 4 without problem    Telemetry   SR 70-80s Personally reviewed  Labs    CBC Recent Labs    05/23/22 0344 05/24/22 0430  WBC 14.7* 13.7*  HGB 11.6* 9.8*  HCT 35.7* 29.6*  MCV 81.1 80.7  PLT 302 265   Basic Metabolic Panel Recent Labs    99/83/38 0344 05/23/22 0810 05/23/22 1716 05/24/22 0430  NA 134*   < > 131* 131*  K 4.3   < > 4.2 4.5  CL 97*   < > 94* 94*  CO2 26   < > 24 26  GLUCOSE 146*   < > 164* 182*  BUN 34*   < > 42* 49*  CREATININE 3.22*   < > 3.95* 4.28*  CALCIUM 8.5*   < > 8.4* 8.3*  MG 2.0  --   --  2.0   < > = values in this interval not displayed.   Liver Function Tests No results for input(s): "AST", "ALT", "ALKPHOS", "BILITOT", "PROT", "ALBUMIN" in the last 72 hours.  No results for input(s): "LIPASE", "AMYLASE" in the last 72 hours. Cardiac Enzymes Recent Labs    05/23/22 0344  CKTOTAL 27*    BNP: BNP (last 3 results) Recent Labs    05/17/22  0153  BNP 222.1*    ProBNP (last 3 results) No results for input(s): "PROBNP" in the last 8760 hours.   D-Dimer No results for input(s): "DDIMER" in the last 72 hours. Hemoglobin A1C No results for input(s): "HGBA1C" in the last 72 hours.  Fasting Lipid Panel No results for input(s): "CHOL", "HDL", "LDLCALC", "TRIG", "CHOLHDL", "LDLDIRECT" in the last 72 hours. Thyroid Function Tests No results for input(s): "TSH", "T4TOTAL", "T3FREE", "THYROIDAB" in the last 72 hours.  Invalid input(s): "FREET3"   Other results:   Imaging    US RENAL  Result Date: 05/23/2022 CLINICAL DATA:  Acute kidney injury EXAM: RENAL / URINARY TRACT ULTRASOUND COMPLETE COMPARISON:  None Available. FINDINGS: Right Kidney: Renal measurements: 12.8 x 6.2 x 5.9 cm = volume: 245 mL. Echogenicity within normal limits. No mass or hydronephrosis  visualized. Left Kidney: Renal measurements: 12.9 x 8.1 x 6.0 cm = volume: 328 mL. Echogenicity within normal limits. No mass or hydronephrosis visualized. Bladder: Appears normal for degree of bladder distention. Other: None. IMPRESSION: Negative. Electronically Signed   By: Charlett Nose M.D.   On: 05/23/2022 23:15     Medications:     Scheduled Medications:  vitamin C  500 mg Oral Daily   Chlorhexidine Gluconate Cloth  6 each Topical Daily   docusate sodium  100 mg Oral Daily   fluticasone  1 spray Each Nare Daily   gabapentin  300 mg Oral BID   guaiFENesin  600 mg Oral BID   insulin aspart  0-20 Units Subcutaneous TID WC   insulin aspart  3 Units Subcutaneous TID WC   insulin glargine-yfgn  15 Units Subcutaneous Daily   lactose free nutrition  237 mL Oral TID WC   lidocaine  1 patch Transdermal Q24H   loratadine  10 mg Oral Daily   methimazole  20 mg Oral BID   metoCLOPramide (REGLAN) injection  10 mg Intravenous Q8H   multivitamin with minerals  1 tablet Oral Daily   pantoprazole  40 mg Oral Daily   polyethylene glycol  17 g Oral BID   senna-docusate  2 tablet Oral Daily   sertraline  50 mg Oral QHS   sodium chloride flush  10-40 mL Intracatheter Q12H   sodium chloride flush  3 mL Intravenous Q12H   traZODone  150 mg Oral QHS   Warfarin - Pharmacist Dosing Inpatient   Does not apply q1600   zinc sulfate  220 mg Oral BID    Infusions:  sodium chloride Stopped (05/18/22 1622)   DAPTOmycin (CUBICIN) 650 mg in sodium chloride 0.9 % IVPB      PRN Medications: sodium chloride, acetaminophen, ipratropium-albuterol, ondansetron (ZOFRAN) IV, mouth rinse, oxyCODONE, sodium chloride flush, sodium chloride flush, sodium hypochlorite   Patient Profile   Mr Cruey is a 61 year old with h/o HFrEF, HMIII LVAD, CAD, S/P CABG 2020 with Maze/LAA clipping, DMII, COPD, HTN, PAF, and previous ETOH abuse.    Admitted after syncope/driveline infection.  Assessment/Plan   1. ID:  Driveline infection.  CT abdomen/pelvis with small fluid collection along pre-peritoneal driveline.  Early sepsis. Lactate initially elevated.  MAP improved with IV fluid, creatinine lower. . S/p driveline site debridement 6/8. Surgical wound cultures growing MRSA  Blood Cx NGTD.  - AF today. WBCs 12.4 >15 > 14.7>13.7 > 19 -> need to follow  - ID following.Vanc switched to Dapto with completion day July 13 th. OPAT orders placed. PICC placed - HH orders placed.  TOC CM following for home  antibiotics.  - Prealbumin 15. Dietitian appreciated. Started on supplements. Appetite improved 2.  Chronic systolic CHF: Echo 10/21 with EF 20-25%, mildly decreased RV function. LHC/RHC in 12/21 with patent grafts, low output. Suspect mixed ischemic/nonischemic cardiomyopathy (prior heavy ETOH and drugs as well as CAD).  No ETOH, drugs, smoking since CABG in 12/20. Admitted with cardiogenic shock in 12/21, had placement of Impella 5.5 initially, now s/p Heartmate 3 LVAD on 11/17/20.  Ramp echo 2/23 with speed decreased to 5500 rpm.  NYHA class II - Volume status stable. Off hydralazine and entresto with AKI. Maps high -> wil add hydralazine - INR 2.1 Goal 2-2.5. Discussed dosing with PharmD personally. - LDH stable 173 3. AKI  - Creatinine trending up 0.9>1.6>3.2>3.7>4.28>4.63.  - Initially thought this was ATN from low BP but he was never markedly hypotensive and LFTs without any evidence of shock liver. ? Vanc toxicity (troughs were ok) - Off entresto and hydralazine. Has been volume repleted with IVF - UA ok. Renal US negative.  - Still making urine. Not uremic. Electrolytes ok - Spoke with Nephrology. Continue watchful waiting 4. CAD: S/p CABG 12/20.  LHC pre-VAD with patent grafts, no target for intervention. - No s/s angina - Continue statin.  5. Atrial fibrillation: Paroxysmal.  S/p Maze and LA appendage clip with CABG in 12/20. Maintaining NSR  - Off amiodarone with hyperthyroidism. - Coumadin as  above 6. Type 2 diabetes: SSI. Running higher. Consulted diabetes coordinator. Semglee added. 7. 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 Wants rehab. UDS negative 8. 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  He missed his endocrinology appt.  - He is now off amiodarone.   - Continue methimazole 20 mg bid, TSH was mildly elevated.   - Will need to reschedule endocrine appt.   9. Moderate Malnutrition -Prealbumin 15. -Dietitian consulted. Started on supplements. Diet liberalized.  - Appetite better when nausea resolved off vanc - Vit C , MVI, and Zinc started. 10. L wrist pain -> suspect acute gout - check uric acid - If uric acid elevated start prednisone.   Arvilla Meres, MD  8:51 AM

## 2022-05-26 DIAGNOSIS — Z95811 Presence of heart assist device: Secondary | ICD-10-CM | POA: Diagnosis not present

## 2022-05-26 DIAGNOSIS — N179 Acute kidney failure, unspecified: Secondary | ICD-10-CM | POA: Diagnosis not present

## 2022-05-26 DIAGNOSIS — T827XXA Infection and inflammatory reaction due to other cardiac and vascular devices, implants and grafts, initial encounter: Secondary | ICD-10-CM | POA: Diagnosis not present

## 2022-05-26 LAB — BASIC METABOLIC PANEL
Anion gap: 13 (ref 5–15)
BUN: 61 mg/dL — ABNORMAL HIGH (ref 8–23)
CO2: 24 mmol/L (ref 22–32)
Calcium: 8.8 mg/dL — ABNORMAL LOW (ref 8.9–10.3)
Chloride: 95 mmol/L — ABNORMAL LOW (ref 98–111)
Creatinine, Ser: 4.42 mg/dL — ABNORMAL HIGH (ref 0.61–1.24)
GFR, Estimated: 14 mL/min — ABNORMAL LOW (ref >60)
Glucose, Bld: 196 mg/dL — ABNORMAL HIGH (ref 70–99)
Potassium: 5.7 mmol/L — ABNORMAL HIGH (ref 3.5–5.1)
Sodium: 132 mmol/L — ABNORMAL LOW (ref 135–145)

## 2022-05-26 LAB — GLUCOSE, CAPILLARY
Glucose-Capillary: 200 mg/dL — ABNORMAL HIGH (ref 70–99)
Glucose-Capillary: 281 mg/dL — ABNORMAL HIGH (ref 70–99)
Glucose-Capillary: 306 mg/dL — ABNORMAL HIGH (ref 70–99)
Glucose-Capillary: 235 mg/dL — ABNORMAL HIGH (ref 70–99)

## 2022-05-26 LAB — CBC
HCT: 27.4 % — ABNORMAL LOW (ref 39.0–52.0)
Hemoglobin: 9.2 g/dL — ABNORMAL LOW (ref 13.0–17.0)
MCH: 27.2 pg (ref 26.0–34.0)
MCHC: 33.6 g/dL (ref 30.0–36.0)
MCV: 81.1 fL (ref 80.0–100.0)
Platelets: 315 10*3/uL (ref 150–400)
RBC: 3.38 MIL/uL — ABNORMAL LOW (ref 4.22–5.81)
RDW: 15.8 % — ABNORMAL HIGH (ref 11.5–15.5)
WBC: 16.2 10*3/uL — ABNORMAL HIGH (ref 4.0–10.5)
nRBC: 0 % (ref 0.0–0.2)

## 2022-05-26 LAB — PROTIME-INR
INR: 2.1 — ABNORMAL HIGH (ref 0.8–1.2)
Prothrombin Time: 23.4 seconds — ABNORMAL HIGH (ref 11.4–15.2)

## 2022-05-26 LAB — LACTATE DEHYDROGENASE: LDH: 178 U/L (ref 98–192)

## 2022-05-26 LAB — MAGNESIUM: Magnesium: 2.1 mg/dL (ref 1.7–2.4)

## 2022-05-26 MED ORDER — FUROSEMIDE 10 MG/ML IJ SOLN
80.0000 mg | Freq: Once | INTRAMUSCULAR | Status: AC
  Administered 2022-05-26: 80 mg via INTRAVENOUS
  Filled 2022-05-26: qty 8

## 2022-05-26 MED ORDER — WARFARIN SODIUM 4 MG PO TABS
4.0000 mg | ORAL_TABLET | Freq: Once | ORAL | Status: AC
  Administered 2022-05-26: 4 mg via ORAL
  Filled 2022-05-26: qty 1

## 2022-05-26 MED ORDER — SODIUM ZIRCONIUM CYCLOSILICATE 10 G PO PACK
10.0000 g | PACK | Freq: Once | ORAL | Status: AC
  Administered 2022-05-26: 10 g via ORAL
  Filled 2022-05-26: qty 1

## 2022-05-26 MED ORDER — INSULIN ASPART 100 UNIT/ML IJ SOLN
0.0000 [IU] | Freq: Three times a day (TID) | INTRAMUSCULAR | Status: DC
  Administered 2022-05-26: 8 [IU] via SUBCUTANEOUS
  Administered 2022-05-26: 11 [IU] via SUBCUTANEOUS
  Administered 2022-05-27: 20 [IU] via SUBCUTANEOUS
  Administered 2022-05-27: 15 [IU] via SUBCUTANEOUS
  Administered 2022-05-27: 11 [IU] via SUBCUTANEOUS
  Administered 2022-05-28: 2 [IU] via SUBCUTANEOUS
  Administered 2022-05-28: 11 [IU] via SUBCUTANEOUS
  Administered 2022-05-28: 5 [IU] via SUBCUTANEOUS
  Administered 2022-05-29 (×2): 3 [IU] via SUBCUTANEOUS
  Administered 2022-05-29 – 2022-05-30 (×2): 5 [IU] via SUBCUTANEOUS
  Administered 2022-05-30: 11 [IU] via SUBCUTANEOUS
  Administered 2022-05-30: 2 [IU] via SUBCUTANEOUS
  Administered 2022-05-31: 5 [IU] via SUBCUTANEOUS
  Administered 2022-05-31: 2 [IU] via SUBCUTANEOUS
  Administered 2022-06-01: 3 [IU] via SUBCUTANEOUS
  Administered 2022-06-01: 2 [IU] via SUBCUTANEOUS
  Administered 2022-06-01: 3 [IU] via SUBCUTANEOUS
  Administered 2022-06-02: 2 [IU] via SUBCUTANEOUS
  Administered 2022-06-02: 3 [IU] via SUBCUTANEOUS
  Administered 2022-06-03: 5 [IU] via SUBCUTANEOUS
  Administered 2022-06-03: 2 [IU] via SUBCUTANEOUS

## 2022-05-26 NOTE — Progress Notes (Signed)
Mobility Specialist: Progress Note   05/26/22 1729  Mobility  Activity Ambulated independently in hallway  Level of Assistance Independent  Assistive Device None  Distance Ambulated (ft) 400 ft  Activity Response Tolerated well  $Mobility charge 1 Mobility   Received pt up in the room having no complaints and agreeable to mobility. Pt was asymptomatic throughout ambulation and returned to room w/o fault. Left in bed w/ call bell in reach and all needs met.  [facility] Asley [place] Mobility Specialist Mobility Specialist 4 East: [phone]

## 2022-05-26 NOTE — Plan of Care (Signed)
Discussed with patient plan of care for the evening, pain management and LVAD without extra tape with some teach back displayed.  Problem: Education: Goal: Knowledge of General Education information will improve Description: Including pain rating scale, medication(s)/side effects and non-pharmacologic comfort measures Outcome: Progressing

## 2022-05-26 NOTE — Progress Notes (Signed)
10 Days Post-Op Procedure(s) (LRB): VAD DRIVELINE DEBRIDEMENT (N/A) Subjective:  Patient complaining of swollen ankles and feet associated with acute kidney injury related to his bacteremia/vancomycin side effect.  Wound examined and wet-to-dry dressing changed. Wound granulation has notably slowed down related with onset of his acute kidney injury.  No sharp debridement needed however.  4 x 4 gauze and sterile saline wet-to-dry dressing applied.  Objective: Vital signs in last 24 hours: Temp:  [97.5 F (36.4 C)-98.7 F (37.1 C)] 98.3 F (36.8 C) (06/18 1211) Pulse Rate:  [80-88] 80 (06/18 0321) Cardiac Rhythm: Normal sinus rhythm (06/18 0702) Resp:  [13-19] 13 (06/18 1211) BP: (128-157)/(90-101) 157/101 (06/18 1211) SpO2:  [92 %-98 %] 98 % (06/18 0745) Weight:  [86.5 kg] 86.5 kg (06/18 0321)  Hemodynamic parameters for last 24 hours:    Intake/Output from previous day: 06/17 0701 - 06/18 0700 In: 120 [P.O.:120] Out: 1950 [Urine:1950] Intake/Output this shift: Total I/O In: 300 [P.O.:300] Out: -   Driveline exit site with healthy granulation tissue at the periphery, some fibrinous exudate remaining around the actual power cord.  Lab Results: Recent Labs    05/25/22 0342 05/26/22 0333  WBC 19.6* 16.2*  HGB 10.3* 9.2*  HCT 31.9* 27.4*  PLT 318 315   BMET:  Recent Labs    05/25/22 0342 05/26/22 0333  NA 132* 132*  K 4.5 5.7*  CL 95* 95*  CO2 24 24  GLUCOSE 76 196*  BUN 56* 61*  CREATININE 4.63* 4.42*  CALCIUM 8.6* 8.8*    PT/INR:  Recent Labs    05/26/22 0333  LABPROT 23.4*  INR 2.1*   ABG    Component Value Date/Time   PHART 7.508 (H) 11/20/2020 0438   HCO3 24.8 09/09/2021 0313   TCO2 29 11/20/2020 0438   ACIDBASEDEF 6.0 (H) 11/24/2019 1718   O2SAT 63.4 09/09/2021 0313   CBG (last 3)  Recent Labs    05/26/22 0621 05/26/22 1112 05/26/22 1609  GLUCAP 200* 281* 306*    Assessment/Plan: S/P Procedure(s) (LRB): VAD DRIVELINE DEBRIDEMENT  (N/A) Continue daily wet-to-dry dressing changes with saline wet-to-dry Creatinine now down to 4.4.  Antibiotics have been changed Patient's wife will need instruction and education on dressing changes at home.  Plan weekly dressing changes Monday mornings in [facility] clinic  LOS: 13 days    Lovett Sox 05/26/2022

## 2022-05-26 NOTE — Progress Notes (Signed)
ANTICOAGULATION CONSULT NOTE   Pharmacy Consult for Warfarin Indication:  LVAD  No Known Allergies  Patient Measurements: Height: 5' 10.5" (179.1 cm) Weight: 86.5 kg (190 lb 11.2 oz) IBW/kg (Calculated) : 74.15   Vital Signs: Temp: 98.1 F (36.7 C) (06/18 0745) Temp Source: Oral (06/18 0745) BP: 146/97 (06/18 0745) Pulse Rate: 80 (06/18 0321)  Labs: Recent Labs    05/24/22 0430 05/25/22 0342 05/26/22 0333  HGB 9.8* 10.3* 9.2*  HCT 29.6* 31.9* 27.4*  PLT 265 318 315  LABPROT 26.0* 23.2* 23.4*  INR 2.4* 2.1* 2.1*  CREATININE 4.28* 4.63* 4.42*     Estimated Creatinine Clearance: 18.4 mL/min (A) (by C-G formula based on SCr of 4.42 mg/dL (H)).   Medical History: Past Medical History:  Diagnosis Date   "    Arthritis    CAD (coronary artery disease)    a. s/p CABG in 11/2019 with LIMA-LAD, SVG-OM1, SVG-PDA and SVG-D1   CHF (congestive heart failure) (HCC)    a. EF < 20% by echo in 11/2019   Essential hypertension    PAF (paroxysmal atrial fibrillation) (HCC)    Type 2 diabetes mellitus (HCC)     Assessment: 61yom with LVAD HM3 implanted 2021, admitted for drive line infection.  INR 2.6 on admit- no bleeding, h/h stable   S/p debridement and surgery ok to resume warfarin 6/8 with no heparin.   INR 2.1 this AM - keeping on low end of goal with possible I&D in future.  No bleeding issues or oozing from wound site. Hemoglobin down slightly to 9.2. Pltc ok.  Of note patient on boost nutritional supplements to help promote wound healing.    PTA warfarin 4mg  TTSS / 6mg  MWF  Goal of Therapy:  INR 2-2.5 Monitor platelets by anticoagulation protocol: Yes   Plan:  Warfarin 4 mg tonight Daily INR  , , [facility] Clinical Pharmacist  05/26/2022 9:43 AM   [facility] pharmacy phone numbers are listed on [email or web address]

## 2022-05-26 NOTE — Progress Notes (Signed)
Patient ID: Kevin Underwood, male   DOB: [birth year], 61 y.o.   MRN: [id number]     Advanced Heart Failure Rounding Note  PCP-Cardiologist: Kevin Lesches, MD   Subjective:    6/5 Admitted with sepsis/driveline infection. Started on vanc and zosyn . Planning for surgical debridement.  6/7 ID consulted.  6/8 Surgical debridement with deep cultures. -> Wound cx with MRSA 6/14 Worsening renal function. Entresto stopped.  6/15 Worsening renal function. Given 1 liters LR. Renal US normal. Hydralazine stopped.   ID switched Vancomycin to Dapto . Nausea resolved. Seen by Dr. Ron Underwood yesterday. Wound healing nicely now  Feels good appetite. Appetite improved. Scr slightly improved 4.6-> 4.4. K 5.7  Good urine output   HGb slightly down. C/o mild LE edema   LVAD Interrogation HM III: Speed: 5500 Flow: 3.4 PI: 7.8  Power: 4.0   Objective:   Weight Range: 82.9 kg Body mass index is 25.85 kg/m.   Vital Signs:   Temp:  [97.7 F (36.5 C)-98.7 F (37.1 C)] 98.3 F (36.8 C) (06/16 0359) Pulse Rate:  [69-81] 81 (06/16 0400) Resp:  [12-18] 16 (06/16 0400) BP: (78-130)/(54-89) 97/73 (06/16 0400) SpO2:  [96 %-98 %] 98 % (06/16 0359) Weight:  [82.9 kg] 82.9 kg (06/16 0359) Last BM Date : 05/23/22  Weight change: Filed Weights   05/22/22 0452 05/23/22 0625 05/24/22 0359  Weight: 78.3 kg 79.7 kg 82.9 kg    Intake/Output:   Intake/Output Summary (Last 24 hours) at 05/24/2022 0728 Last data filed at 05/24/2022 0405 Gross per 24 hour  Intake 1260 ml  Output 1125 ml  Net 135 ml      Physical Exam    MAPs 100-110   General:  NAD.  HEENT: normal  Neck: supple. JVP not elevated.  Carotids 2+ bilat; no bruits. No lymphadenopathy or thryomegaly appreciated. Cor: LVAD hum.  Lungs: Clear. Abdomen: obese soft, nontender, non-distended. No hepatosplenomegaly. No bruits or masses. Good bowel sounds. Driveline site clean. Anchor in place.  Extremities: no cyanosis, clubbing, rash. Warm  1-2+ edema R> L Neuro: alert & oriented x 3. No focal deficits. Moves all 4 without problem    Telemetry   SR 70-80s Personally reviewed  Labs    CBC    Latest Ref Rng & Units 05/26/2022    3:33 AM 05/25/2022    3:42 AM 05/24/2022    4:30 AM  CBC  WBC 4.0 - 10.5 K/uL 16.2  19.6  13.7   Hemoglobin 13.0 - 17.0 g/dL 9.2  10.3  9.8   Hematocrit 39.0 - 52.0 % 27.4  31.9  29.6   Platelets 150 - 400 K/uL 315  318  265     Basic Metabolic Panel    Latest Ref Rng & Units 05/26/2022    3:33 AM 05/25/2022    3:42 AM 05/24/2022    4:30 AM  BMP  Glucose 70 - 99 mg/dL 196  76  182   BUN 8 - 23 mg/dL 61  56  49   Creatinine 0.61 - 1.24 mg/dL 4.42  4.63  4.28   Sodium 135 - 145 mmol/L 132  132  131   Potassium 3.5 - 5.1 mmol/L 5.7  4.5  4.5   Chloride 98 - 111 mmol/L 95  95  94   CO2 22 - 32 mmol/L 24  24  26    Calcium 8.9 - 10.3 mg/dL 8.8  8.6  8.3      Other results:   Imaging  US RENAL  Result Date: 05/23/2022 CLINICAL DATA:  Acute kidney injury EXAM: RENAL / URINARY TRACT ULTRASOUND COMPLETE COMPARISON:  None Available. FINDINGS: Right Kidney: Renal measurements: 12.8 x 6.2 x 5.9 cm = volume: 245 mL. Echogenicity within normal limits. No mass or hydronephrosis visualized. Left Kidney: Renal measurements: 12.9 x 8.1 x 6.0 cm = volume: 328 mL. Echogenicity within normal limits. No mass or hydronephrosis visualized. Bladder: Appears normal for degree of bladder distention. Other: None. IMPRESSION: Negative. Electronically Signed   By: Kevin Underwood M.D.   On: 05/23/2022 23:15     Medications:     Scheduled Medications:  vitamin C  500 mg Oral Daily   Chlorhexidine Gluconate Cloth  6 each Topical Daily   docusate sodium  100 mg Oral Daily   fluticasone  1 spray Each Nare Daily   gabapentin  300 mg Oral BID   guaiFENesin  600 mg Oral BID   insulin aspart  0-20 Units Subcutaneous TID WC   insulin aspart  3 Units Subcutaneous TID WC   insulin glargine-yfgn  15 Units  Subcutaneous Daily   lactose free nutrition  237 mL Oral TID WC   lidocaine  1 patch Transdermal Q24H   loratadine  10 mg Oral Daily   methimazole  20 mg Oral BID   metoCLOPramide (REGLAN) injection  10 mg Intravenous Q8H   multivitamin with minerals  1 tablet Oral Daily   pantoprazole  40 mg Oral Daily   polyethylene glycol  17 g Oral BID   senna-docusate  2 tablet Oral Daily   sertraline  50 mg Oral QHS   sodium chloride flush  10-40 mL Intracatheter Q12H   sodium chloride flush  3 mL Intravenous Q12H   traZODone  150 mg Oral QHS   Warfarin - Pharmacist Dosing Inpatient   Does not apply q1600   zinc sulfate  220 mg Oral BID    Infusions:  sodium chloride Stopped (05/18/22 1622)   DAPTOmycin (CUBICIN) 650 mg in sodium chloride 0.9 % IVPB      PRN Medications: sodium chloride, acetaminophen, ipratropium-albuterol, ondansetron (ZOFRAN) IV, mouth rinse, oxyCODONE, sodium chloride flush, sodium chloride flush, sodium hypochlorite   Patient Profile   [name] is a 61 year old with h/o HFrEF, HMIII LVAD, CAD, S/P CABG 2020 with Maze/LAA clipping, DMII, COPD, HTN, PAF, and previous ETOH abuse.    Admitted after syncope/driveline infection.  Assessment/Plan   1. ID: Driveline infection.  CT abdomen/pelvis with small fluid collection along pre-peritoneal driveline.  Early sepsis. Lactate initially elevated.  MAP improved with IV fluid, creatinine lower. . S/p driveline site debridement 6/8. Surgical wound cultures growing MRSA  Blood Cx NGTD.  - AF today. WBCs 12.4 >15 > 14.7>13.7 > 19 -> 16 - ID following.Vanc switched to Dapto with completion day July 13 th. OPAT orders placed. PICC placed - HH orders placed.  TOC CM following for home antibiotics.  - Prealbumin 15. Dietitian appreciated. Started on supplements. Appetite improved - Seen by Dr. Norina Underwood 6/16 wound healing well. Continue wet-to-dry dressings 2.  Chronic systolic CHF: Echo 10/21 with EF 20-25%, mildly decreased RV  function. LHC/RHC in 12/21 with patent grafts, low output. Suspect mixed ischemic/nonischemic cardiomyopathy (prior heavy ETOH and drugs as well as CAD).  No ETOH, drugs, smoking since CABG in 12/20. Admitted with cardiogenic shock in 12/21, had placement of Impella 5.5 initially, now s/p Heartmate 3 LVAD on 11/17/20.  Ramp echo 2/23 with speed decreased to 5500 rpm.  NYHA class II - Volume status up a bit today. Give IV lasix. Off hydralazine and entresto with AKI.  - Hydralazine restarted Maps high -> wil increase hydralazine - INR 2.1 Goal 2-2.5. Discussed dosing with PharmD personally. - LDH stable 178 3. AKI  - Creatinine starting to improve 0.9>1.6>3.2>3.7>4.28>4.63> 4.42  - Initially thought this was ATN from low BP but he was never markedly hypotensive and LFTs without any evidence of shock liver. ? Vanc toxicity (troughs were ok) - Off entresto and hydralazine. Has been volume repleted with IVF - UA ok. Renal US negative.  - Still making urine. Not uremic. Electrolytes ok - Spoke with Nephrology. Continue watchful waiting. Will give dose of lasix today with volume overload  4. CAD: S/p CABG 12/20.  LHC pre-VAD with patent grafts, no target for intervention. - No s/s angina - Continue statin.  5. Atrial fibrillation: Paroxysmal.  S/p Maze and LA appendage clip with CABG in 12/20. Maintaining NSR  - Off amiodarone with hyperthyroidism. - Coumadin as above 6. Type 2 diabetes: SSI. Running higher. Consulted diabetes coordinator. Semglee added. - Continue SSI 7. 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 Wants rehab. UDS negative 8. 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  He missed his endocrinology appt.  - He is now off amiodarone.   - Continue methimazole 20 mg bid, TSH was mildly elevated.   - Will need to reschedule endocrine appt.   9. Moderate Malnutrition -Prealbumin 15. -Dietitian consulted. Started on supplements. Diet liberalized.  - Appetite better  when nausea resolved off vanc - Vit C , MVI, and Zinc started. 10. L wrist pain -> suspect acute gout - Uric acid normal  - Improved with prednisone 11. Hyperkalemia - give lokelma   Arvilla Meres, MD  9:35 AM

## 2022-05-27 DIAGNOSIS — T829XXA Unspecified complication of cardiac and vascular prosthetic device, implant and graft, initial encounter: Secondary | ICD-10-CM | POA: Diagnosis not present

## 2022-05-27 DIAGNOSIS — Z95811 Presence of heart assist device: Secondary | ICD-10-CM | POA: Diagnosis not present

## 2022-05-27 DIAGNOSIS — T827XXA Infection and inflammatory reaction due to other cardiac and vascular devices, implants and grafts, initial encounter: Secondary | ICD-10-CM | POA: Diagnosis not present

## 2022-05-27 DIAGNOSIS — N179 Acute kidney failure, unspecified: Secondary | ICD-10-CM | POA: Diagnosis not present

## 2022-05-27 LAB — BASIC METABOLIC PANEL
Anion gap: 11 (ref 5–15)
BUN: 68 mg/dL — ABNORMAL HIGH (ref 8–23)
CO2: 28 mmol/L (ref 22–32)
Calcium: 8.7 mg/dL — ABNORMAL LOW (ref 8.9–10.3)
Chloride: 94 mmol/L — ABNORMAL LOW (ref 98–111)
Creatinine, Ser: 4.17 mg/dL — ABNORMAL HIGH (ref 0.61–1.24)
GFR, Estimated: 15 mL/min — ABNORMAL LOW (ref >60)
Glucose, Bld: 252 mg/dL — ABNORMAL HIGH (ref 70–99)
Potassium: 5.6 mmol/L — ABNORMAL HIGH (ref 3.5–5.1)
Sodium: 133 mmol/L — ABNORMAL LOW (ref 135–145)

## 2022-05-27 LAB — PROTIME-INR
INR: 1.9 — ABNORMAL HIGH (ref 0.8–1.2)
Prothrombin Time: 21.4 seconds — ABNORMAL HIGH (ref 11.4–15.2)

## 2022-05-27 LAB — MAGNESIUM: Magnesium: 2.2 mg/dL (ref 1.7–2.4)

## 2022-05-27 LAB — GLUCOSE, CAPILLARY
Glucose-Capillary: 226 mg/dL — ABNORMAL HIGH (ref 70–99)
Glucose-Capillary: 337 mg/dL — ABNORMAL HIGH (ref 70–99)
Glucose-Capillary: 367 mg/dL — ABNORMAL HIGH (ref 70–99)
Glucose-Capillary: 465 mg/dL — ABNORMAL HIGH (ref 70–99)
Glucose-Capillary: 269 mg/dL — ABNORMAL HIGH (ref 70–99)

## 2022-05-27 LAB — LACTATE DEHYDROGENASE: LDH: 216 U/L — ABNORMAL HIGH (ref 98–192)

## 2022-05-27 LAB — CBC
HCT: 29 % — ABNORMAL LOW (ref 39.0–52.0)
Hemoglobin: 9.6 g/dL — ABNORMAL LOW (ref 13.0–17.0)
MCH: 26.7 pg (ref 26.0–34.0)
MCHC: 33.1 g/dL (ref 30.0–36.0)
MCV: 80.8 fL (ref 80.0–100.0)
Platelets: 374 10*3/uL (ref 150–400)
RBC: 3.59 MIL/uL — ABNORMAL LOW (ref 4.22–5.81)
RDW: 15.9 % — ABNORMAL HIGH (ref 11.5–15.5)
WBC: 16 10*3/uL — ABNORMAL HIGH (ref 4.0–10.5)
nRBC: 0 % (ref 0.0–0.2)

## 2022-05-27 MED ORDER — IPRATROPIUM-ALBUTEROL 0.5-2.5 (3) MG/3ML IN SOLN
3.0000 mL | Freq: Two times a day (BID) | RESPIRATORY_TRACT | Status: DC
Start: 2022-05-28 — End: 2022-06-01
  Administered 2022-05-28 – 2022-05-31 (×7): 3 mL via RESPIRATORY_TRACT
  Filled 2022-05-27 (×8): qty 3

## 2022-05-27 MED ORDER — FUROSEMIDE 10 MG/ML IJ SOLN
80.0000 mg | Freq: Two times a day (BID) | INTRAMUSCULAR | Status: DC
  Administered 2022-05-27: 80 mg via INTRAVENOUS
  Filled 2022-05-27: qty 8

## 2022-05-27 MED ORDER — SODIUM ZIRCONIUM CYCLOSILICATE 10 G PO PACK
10.0000 g | PACK | Freq: Once | ORAL | Status: AC
  Administered 2022-05-27: 10 g via ORAL
  Filled 2022-05-27: qty 1

## 2022-05-27 MED ORDER — TRAMADOL HCL 50 MG PO TABS
50.0000 mg | ORAL_TABLET | Freq: Two times a day (BID) | ORAL | Status: DC
  Administered 2022-05-27 – 2022-06-03 (×15): 50 mg via ORAL
  Filled 2022-05-27 (×15): qty 1

## 2022-05-27 MED ORDER — METOLAZONE 2.5 MG PO TABS
2.5000 mg | ORAL_TABLET | Freq: Once | ORAL | Status: AC
Start: 2022-05-27 — End: 2022-05-27
  Administered 2022-05-27: 2.5 mg via ORAL
  Filled 2022-05-27: qty 1

## 2022-05-27 MED ORDER — IPRATROPIUM-ALBUTEROL 0.5-2.5 (3) MG/3ML IN SOLN
3.0000 mL | Freq: Four times a day (QID) | RESPIRATORY_TRACT | Status: DC
  Administered 2022-05-27 (×3): 3 mL via RESPIRATORY_TRACT
  Filled 2022-05-27: qty 3
  Filled 2022-05-27: qty 42
  Filled 2022-05-27: qty 3

## 2022-05-27 MED ORDER — WARFARIN SODIUM 5 MG PO TABS
5.0000 mg | ORAL_TABLET | Freq: Once | ORAL | Status: AC
  Administered 2022-05-27: 5 mg via ORAL
  Filled 2022-05-27: qty 1

## 2022-05-27 MED ORDER — ALBUTEROL SULFATE (2.5 MG/3ML) 0.083% IN NEBU
2.5000 mg | INHALATION_SOLUTION | RESPIRATORY_TRACT | Status: DC | PRN
  Administered 2022-06-01 – 2022-06-02 (×3): 2.5 mg via RESPIRATORY_TRACT
  Filled 2022-05-27 (×3): qty 3

## 2022-05-27 MED ORDER — HYDRALAZINE HCL 25 MG PO TABS
37.5000 mg | ORAL_TABLET | Freq: Three times a day (TID) | ORAL | Status: DC
  Administered 2022-05-27 – 2022-05-28 (×3): 37.5 mg via ORAL
  Filled 2022-05-27 (×3): qty 2

## 2022-05-27 NOTE — Progress Notes (Addendum)
Advanced Heart Failure VAD Team Note  PCP-Cardiologist: Nona Dell, MD   Subjective:   Admit weight 173--->188 pounds.   6/5 Admitted with sepsis/driveline infection. Started on vanc and zosyn . Planning for surgical debridement.  6/7 ID consulted.  6/8 Surgical debridement with deep cultures. -> Wound cx with MRSA 6/14 Worsening renal function. Entresto stopped.  6/15 Worsening renal function. Given 1 liters LR. Renal US normal. Hydralazine stopped.  6/18 Given 80 mg IV lasix. K running high, given lokelma.    Weight down a couple of pounds.   WBC 10.6>16.2>16 (got prednisone 6/17, 6/18, 6/19)   Scr slightly improved 4.6-> 4.4>4.17 . K 5.7>5.6 . Making > 1 liter of urine    Complaining of difficulty sleeping over night due to wheezing. Denies abdominal pain.      LVAD INTERROGATION:  HeartMate III LVAD:   Flow 3.2 iters/min, speed 5500, power 4 PI 5.8 .  Currently on batteries.   Objective:    Vital Signs:   Temp:  [97.6 F (36.4 C)-98.3 F (36.8 C)] 97.9 F (36.6 C) (06/18 2300) Pulse Rate:  [78-83] 83 (06/19 0448) Resp:  [10-20] 20 (06/19 0511) BP: (120-175)/(86-111) 157/103 (06/19 0511) SpO2:  [93 %-98 %] 93 % (06/19 0448) Weight:  [85.3 kg] 85.3 kg (06/19 0500) Last BM Date : 05/26/22 (per pt) Mean arterial Pressure 110s   Intake/Output:   Intake/Output Summary (Last 24 hours) at 05/27/2022 0739 Last data filed at 05/27/2022 0500 Gross per 24 hour  Intake 1803.1 ml  Output 1000 ml  Net 803.1 ml     Physical Exam    General:  No resp difficulty HEENT: normal Neck: supple. JVP 11-11. Carotids 2+ bilat; no bruits. No lymphadenopathy or thyromegaly appreciated. Cor: Mechanical heart sounds with LVAD hum present. Lungs: EW on right.  Abdomen: soft, nontender, nondistended. No hepatosplenomegaly. No bruits or masses. Good bowel sounds. Driveline: C/D/I; securement device intact and driveline incorporated Extremities: no cyanosis, clubbing, rash, R  and LLE trace-1+edema. RUE PICC Neuro: alert & orientedx3, cranial nerves grossly intact. moves all 4 extremities w/o difficulty. Affect pleasant   Telemetry  SR 70-80s   EKG    N/A  Labs   Basic Metabolic Panel: Recent Labs  Lab 05/23/22 0344 05/23/22 0810 05/23/22 1716 05/24/22 0430 05/25/22 0342 05/26/22 0333 05/27/22 0430  NA 134*   < > 131* 131* 132* 132* 133*  K 4.3   < > 4.2 4.5 4.5 5.7* 5.6*  CL 97*   < > 94* 94* 95* 95* 94*  CO2 26   < > 24 26 24 24 28   GLUCOSE 146*   < > 164* 182* 76 196* 252*  BUN 34*   < > 42* 49* 56* 61* 68*  CREATININE 3.22*   < > 3.95* 4.28* 4.63* 4.42* 4.17*  CALCIUM 8.5*   < > 8.4* 8.3* 8.6* 8.8* 8.7*  MG 2.0  --   --  2.0 2.3 2.1 2.2   < > = values in this interval not displayed.    Liver Function Tests: Recent Labs  Lab 05/25/22 0342  AST 35  ALT 49*  ALKPHOS 90  BILITOT 0.3  PROT 6.4*  ALBUMIN 2.6*   No results for input(s): "LIPASE", "AMYLASE" in the last 168 hours. No results for input(s): "AMMONIA" in the last 168 hours.  CBC: Recent Labs  Lab 05/23/22 0344 05/24/22 0430 05/25/22 0342 05/26/22 0333 05/27/22 0430  WBC 14.7* 13.7* 19.6* 16.2* 16.0*  HGB 11.6* 9.8* 10.3* 9.2*  9.6*  HCT 35.7* 29.6* 31.9* 27.4* 29.0*  MCV 81.1 80.7 82.0 81.1 80.8  PLT 302 265 318 315 374    INR: Recent Labs  Lab 05/23/22 0344 05/24/22 0430 05/25/22 0342 05/26/22 0333 05/27/22 0430  INR 2.5* 2.4* 2.1* 2.1* 1.9*    Other results: EKG:    Imaging   No results found.   Medications:     Scheduled Medications:  vitamin C  500 mg Oral Daily   Chlorhexidine Gluconate Cloth  6 each Topical Daily   docusate sodium  100 mg Oral Daily   fluticasone  1 spray Each Nare Daily   gabapentin  300 mg Oral BID   guaiFENesin  600 mg Oral BID   hydrALAZINE  37.5 mg Oral Q8H   insulin aspart  0-15 Units Subcutaneous TID WC   insulin aspart  0-5 Units Subcutaneous QHS   insulin aspart  3 Units Subcutaneous TID WC   insulin  glargine-yfgn  15 Units Subcutaneous Daily   lactose free nutrition  237 mL Oral TID WC   lidocaine  1 patch Transdermal Q24H   loratadine  10 mg Oral Daily   methimazole  20 mg Oral BID   multivitamin with minerals  1 tablet Oral Daily   pantoprazole  40 mg Oral Daily   polyethylene glycol  17 g Oral BID   senna-docusate  2 tablet Oral Daily   sertraline  50 mg Oral QHS   sodium chloride flush  10-40 mL Intracatheter Q12H   sodium chloride flush  3 mL Intravenous Q12H   sodium zirconium cyclosilicate  10 g Oral Once   traZODone  150 mg Oral QHS   Warfarin - Pharmacist Dosing Inpatient   Does not apply q1600   zinc sulfate  220 mg Oral BID    Infusions:  sodium chloride Stopped (05/18/22 1622)   DAPTOmycin (CUBICIN) 650 mg in sodium chloride 0.9 % IVPB Stopped (05/27/22 0116)    PRN Medications: sodium chloride, acetaminophen, ipratropium-albuterol, ondansetron (ZOFRAN) IV, mouth rinse, oxyCODONE, sodium chloride flush, sodium chloride flush, sodium hypochlorite   Patient Profile  [name] is a 61 year old with h/o HFrEF, HMIII LVAD, CAD, S/P CABG 2020 with Maze/LAA clipping, DMII, COPD, HTN, PAF, and previous ETOH abuse.    Admitted after syncope/driveline infection.     Assessment/Plan:   1. ID: Driveline infection.  CT abdomen/pelvis with small fluid collection along pre-peritoneal driveline.  Early sepsis. Lactate initially elevated.  MAP improved with IV fluid, creatinine lower. . S/p driveline site debridement 6/8. Surgical wound cultures growing MRSA  Blood Cx NGTD.  - AF today. WBCs 12.4 >15 > 14.7>13.7 > 19 -> 16->16 . (Got 3 days of prednisone 617-6/19).  - ID following.Vanc switched to Dapto with completion day July 13 th. OPAT orders placed. PICC placed - HH orders placed.  TOC CM following for home antibiotics.  - Prealbumin 15. Dietitian appreciated. Started on supplements. Appetite improved - Seen by Dr. Norina Buzzard 6/16 wound healing well. Continue wet-to-dry  dressings - Stop Oxy IR --> switch to tramadol. Discussed plan to eventually switch to tylenol.  2.  Chronic systolic CHF: Echo 10/21 with EF 20-25%, mildly decreased RV function. LHC/RHC in 12/21 with patent grafts, low output. Suspect mixed ischemic/nonischemic cardiomyopathy (prior heavy ETOH and drugs as well as CAD).  No ETOH, drugs, smoking since CABG in 12/20. Admitted with cardiogenic shock in 12/21, had placement of Impella 5.5 initially, now s/p Heartmate 3 LVAD on 11/17/20.  Ramp echo  2/23 with speed decreased to 5500 rpm.  NYHA class II - Start checking CVP. Still volume overloaded. Give 80 mg IV lasix twice today.  -Off hydralazine and entresto with AKI.  - Hydralazine restarted Maps high -> wil increase hydralazine 37.5 mg three times a day.  - INR 1.9 Goal 2-2.5. Discussed dosing with PharmD personally. - LDH 178>216 3. AKI  - Creatinine starting to improve 0.9>1.6>3.2>3.7>4.28>4.63> 4.42 >4.17 - Initially thought this was ATN from low BP but he was never markedly hypotensive and LFTs without any evidence of shock liver. ? Vanc toxicity (troughs were ok) - Off entresto and hydralazine. Has been volume repleted with IVF.  - UA ok. Renal US negative.  -Continue to make > 1 liter urine. Not uremic. K elevated.  - Spoke with Nephrology. Continue watchful waiting.  - needs additional lasix.  4. CAD: S/p CABG 12/20.  LHC pre-VAD with patent grafts, no target for intervention. - No s/s angina - Continue statin.  5. Atrial fibrillation: Paroxysmal.  S/p Maze and LA appendage clip with CABG in 12/20. Maintaining NSR . Prior to admit he was on 25 mg Toprol XL - Off amiodarone with hyperthyroidism. - Coumadin as above 6. Type 2 diabetes: SSI. Running higher. Consulted diabetes coordinator. -Semglee added. - Continue SSI. Running higher with prednisone.  7. 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 Wants rehab. UDS negative 8. 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   He missed his endocrinology appt.  - He is now off amiodarone.   - Continue methimazole 20 mg bid, TSH was mildly elevated.   - Will need to reschedule endocrine appt.   9. Moderate Malnutrition -Prealbumin 15. -Dietitian consulted. Started on supplements. Diet liberalized.  - Appetite better when nausea resolved off vanc - Vit C , MVI, and Zinc started. 10. L wrist pain -> suspect acute gout - Uric acid normal  - Improved with prednisone 11. Hyperkalemia - K remains elevated.  - Give 10 grams lokelma.  - Change diet to low/no potassium. Eating bananas daily and drinking supplements.  12. COPD  Wheezing this morning. Completing 3 days prednisone. Changed to scheduled nebs.   Needs strict I/Os.  Set up CVPs.  Mobilize.     I reviewed the LVAD parameters from today, and compared the results to the patient's prior recorded data.  No programming changes were made.  The LVAD is functioning within specified parameters.  The patient performs LVAD self-test daily.  LVAD interrogation was negative for any significant power changes, alarms or PI events/speed drops.  LVAD equipment check completed and is in good working order.  Back-up equipment present.   LVAD education done on emergency procedures and precautions and reviewed exit site care.  Length of Stay: 14  Tonye Becket, NP 05/27/2022, 7:39 AM  VAD Team --- VAD ISSUES ONLY--- Pager [phone] (7am - 7am)  Advanced Heart Failure Team  Pager [phone] (M-F; 7a - 5p)  Please contact [facility] for night-coverage after hours (5p -7a ) and weekends on [email or web address]  Patient seen and examined with the above-signed Advanced Practice Provider and/or Housestaff. I personally reviewed laboratory data, imaging studies and relevant notes. I independently examined the patient and formulated the important aspects of the plan. I have edited the note to reflect any of my changes or salient points. I have personally discussed the plan with the patient  and/or family.  Complains of SOB and wheezing. Got 1 dose IV lasix with 1L out. Denies ab pain.  Scr starting to trend down. Afebrile  General:  NAD.  HEENT: normal  Neck: supple. JVP elevated.  Carotids 2+ bilat; no bruits. No lymphadenopathy or thryomegaly appreciated. Cor: LVAD hum.  Lungs: Clear. Abdomen: obese soft, nontender, non-distended. No hepatosplenomegaly. No bruits or masses. Good bowel sounds. Driveline site dressing clean. Anchor in place.  Extremities: no cyanosis, clubbing, rash. Warm no edema  Neuro: alert & oriented x 3. No focal deficits. Moves all 4 without problem   He is volume overloaded. Only mild response to IV lasix. Will give a dose of metolazone today. Continue wound care and IV abx.   VAD interrogated personally. Parameters stable.  Arvilla Meres, MD  9:43 AM

## 2022-05-27 NOTE — Progress Notes (Signed)
Mobility Specialist Progress Note    05/27/22 1604  Mobility  Activity Refused mobility   Pt stated he was just dosing off. Will f/u as schedule permits.   Sunday [place] Mobility Specialist

## 2022-05-27 NOTE — Progress Notes (Addendum)
LVAD Coordinator Rounding Note:  Admitted 05/13/22 to Dr Alford Highland service due to possible sepsis and drive line infection.   HM III LVAD implanted on 11/17/20 by Dr Vickey Sages under destination therapy criteria.    6/5 Admitted with sepsis/driveline infection. Started on vanc and zosyn . Planning for surgical debridement.  6/7 ID consulted.  6/8 Surgical debridement with deep cultures. 6/14 Worsening renal function. Entresto stopped.  6/15 Worsening renal function. Given 1 liters LR. Renal US normal. Hydralazine stopped.  6/18 Given 80 mg IV lasix. K running high, given lokelma.   Pt sitting up in chair watching TV. He is upset this morning, and wants to go home.  Pt denies pain or nausea.   CR 4.17 this morning, pt is making urine. Volume overloaded- per Dr Gala Romney will give Metolazone today.   Per ID: Discharge antibiotics to be given via PICC line:  Per pharmacy protocol DAPTOMYCIN 8mg /kg/48h Duration: 4 weeks from surgery  End Date: July 13th   Per July 15 note 05/23/22 [facility] has accepted pt for [facility] needs. Possible discharge later this week.  Wound dressing change completed. Pt's wife [facility] observed dressing change again today. She is able to verbalize dressing change step by step without prompting. Will continue daily dressing change education with pt's wife.  Vital signs: Temp: 97.6 HR: 76 Doppler Pressure: 120 Automatic BP: 141/91 (105) O2 Sat: 95% on RA Wt: 173.1>173.0>175.2>172.6>175.7>182.7>188.1 lbs    LVAD interrogation reveals:  Speed: 5500 Flow: 3.7 Power: 4.0 w PI: 5.3 Hct: 29  Alarms: none today; NO EXTERNAL POWER 6/18 ? Pt double disconnected Events: 12 today  Fixed speed: 5500 Low speed limit: 5200   Drive Line: Pt's wife 7/18 observed dressing change again today. Existing VAD dressing clean, dry, and intact. Existing VAD dressing removed and site care performed using sterile technique. Drive line exit site cleaned with Chlora  prep applicators x 2, allowed to dry, packed with 2 saline moistened 4 x 4 and covered with several 4 x 4. Small amount of serosanguinous drainage. See picture below. No redness, tenderness, foul odor or rash noted. Drive line anchor re-applied. Dr Kennith Center will change dressing again tomorrow afternoon between cases at bedside.     Labs:  LDH trend: 132>116>127>141>117>124>143>130>153>216  INR trend: 2.9>1.9>1.9>2.3>3.8>2.0>2.5>2.4>1.9  WBC: 17.8 (at [facility])>15.6>11.2>10.9>11.4>12.8>11.8>12.4>15>14.7>13.7>16  Infection: 05/13/22>>Blood cultures>> no growth 4 days; final  05/13/22>>Drive line culture>>rare gram + cocci in pairs & rare gram + rods; final culture few normal skin flora 05/16/22>>Deep DL wound culture in OR>>rare gram + cocci in pairs; abundant staph auerus; final  05/16/22>>Fungus cx from OR>>negative; final 05/16/22>>AFB cx from OR>>MRSA  Anticoagulation Plan: -INR Goal: 2.0 - 2.5 -ASA Dose: none  Blood Products: 05/14/22>> 2 FFP  Drips:  Heparin- stopped  Plan/Recommendations:  1. Page VAD coordinator for VAD equipment or drive line issues 2. Daily wet to dry dressings with saline solution per Dr. 07/14/22. Pt will need premedication.    Maren Beach RN VAD Coordinator  Office: [phone]  24/7 Pager: [phone]

## 2022-05-27 NOTE — Progress Notes (Signed)
CSW met with patient at bedside to discuss concerns patient shared about drug use and rehab. CSW met with patient and wife who shared history of current drug of choice "meth" and reports he last used 2 days prior to hospitalization. Patient reports that he used 3-5 x weekly and states that "it was a little amount like a gram a week in total". He spoke about his past history with cocaine use and that he went to rehab in [place], [place] prior to getting his LVAD and was able to "get clean". He states a 45 year history with tobacco and has been using meth for the last 6 months. When asked about his triggers to use meth he stated that "when I want it..I want it".  He shared that "I get bored and then that can escalate my anger". Patient states he would prefer an inpatient stay rather than attending an outpatient program such as NA. CSW shared challenges of an inpatient program that will accept an LVAD and the ongoing medical needs associated with the VAD. CSW discussed coping strategies and how to translate what worked with his past treatment to current. CSW will explore some options and return to patient next week for further discussions. Patient and wife grateful for the support and resources as he states he is now motivated for transplant.  Raquel Sarna, [place], [place]

## 2022-05-27 NOTE — Progress Notes (Signed)
Paged PA Bhagat at 1750 due to blood sugar of 465, orders to give 20 units of sliding scale insulin as well as the 3 units of meal coverage. Will continue to monitor.

## 2022-05-27 NOTE — Progress Notes (Signed)
ANTICOAGULATION CONSULT NOTE   Pharmacy Consult for Warfarin Indication:  LVAD  No Known Allergies  Patient Measurements: Height: 5' 10.5" (179.1 cm) Weight: 85.3 kg (188 lb 0.8 oz) IBW/kg (Calculated) : 74.15   Vital Signs: Temp: 97.6 F (36.4 C) (06/19 1201) Temp Source: Oral (06/19 1201) BP: 132/94 (06/19 1201) Pulse Rate: 79 (06/19 1201)  Labs: Recent Labs    05/25/22 0342 05/26/22 0333 05/27/22 0430  HGB 10.3* 9.2* 9.6*  HCT 31.9* 27.4* 29.0*  PLT 318 315 374  LABPROT 23.2* 23.4* 21.4*  INR 2.1* 2.1* 1.9*  CREATININE 4.63* 4.42* 4.17*     Estimated Creatinine Clearance: 19.5 mL/min (A) (by C-G formula based on SCr of 4.17 mg/dL (H)).   Medical History: Past Medical History:  Diagnosis Date   "    Arthritis    CAD (coronary artery disease)    a. s/p CABG in 11/2019 with LIMA-LAD, SVG-OM1, SVG-PDA and SVG-D1   CHF (congestive heart failure) (HCC)    a. EF < 20% by echo in 11/2019   Essential hypertension    PAF (paroxysmal atrial fibrillation) (HCC)    Type 2 diabetes mellitus (HCC)     Assessment: 61yom with LVAD HM3 implanted 2021, admitted for drive line infection.  INR 2.6 on admit- no bleeding, h/h stable   S/p debridement and surgery ok to resume warfarin 6/8 with no heparin.   INR 1.9 this AM - keeping on low end of goal with possible I&D in future.  No bleeding issues or oozing from wound site. Hemoglobin stable at 9.6. Pltc ok. LDH up slightly to 216.   Of note patient on boost nutritional supplements to help promote wound healing.    PTA warfarin 4mg  TTSS / 6mg  MWF  Goal of Therapy:  INR 2-2.5 Monitor platelets by anticoagulation protocol: Yes   Plan:  Warfarin 5 mg tonight Daily INR  PharmD., BCPS Clinical Pharmacist 05/27/2022 1:01 PM

## 2022-05-27 NOTE — Progress Notes (Signed)
CSW met with patient at bedside. Patient states he is feeling better and has not used since prior to hospitalization that he could go to an outpatient program. CSW discussed some possibilities and explained the outpatient programs would require a daily commitment. Patient states that he was selling drugs to make money and know he can work so it will not be as easy to use again. Patient states he would like to attend a NA program that is local to his home in [place]. CSW will explore some options for additional NA meetings and continue to discuss treatment options with patient. CSW encouraged patient to attend LVAD Support Group tomorrow while he is here in the hospital to get some added support. Patient agreeable. CSW will continue to follow. Raquel Sarna, [place], [place]

## 2022-05-28 DIAGNOSIS — T829XXA Unspecified complication of cardiac and vascular prosthetic device, implant and graft, initial encounter: Secondary | ICD-10-CM | POA: Diagnosis not present

## 2022-05-28 DIAGNOSIS — Z95811 Presence of heart assist device: Secondary | ICD-10-CM | POA: Diagnosis not present

## 2022-05-28 DIAGNOSIS — T827XXA Infection and inflammatory reaction due to other cardiac and vascular devices, implants and grafts, initial encounter: Secondary | ICD-10-CM | POA: Diagnosis not present

## 2022-05-28 DIAGNOSIS — N179 Acute kidney failure, unspecified: Secondary | ICD-10-CM | POA: Diagnosis not present

## 2022-05-28 LAB — CBC
HCT: 28.8 % — ABNORMAL LOW (ref 39.0–52.0)
Hemoglobin: 9.6 g/dL — ABNORMAL LOW (ref 13.0–17.0)
MCH: 27 pg (ref 26.0–34.0)
MCHC: 33.3 g/dL (ref 30.0–36.0)
MCV: 80.9 fL (ref 80.0–100.0)
Platelets: 377 10*3/uL (ref 150–400)
RBC: 3.56 MIL/uL — ABNORMAL LOW (ref 4.22–5.81)
RDW: 15.9 % — ABNORMAL HIGH (ref 11.5–15.5)
WBC: 15 10*3/uL — ABNORMAL HIGH (ref 4.0–10.5)
nRBC: 0 % (ref 0.0–0.2)

## 2022-05-28 LAB — BASIC METABOLIC PANEL
Anion gap: 12 (ref 5–15)
BUN: 66 mg/dL — ABNORMAL HIGH (ref 8–23)
CO2: 27 mmol/L (ref 22–32)
Calcium: 8.8 mg/dL — ABNORMAL LOW (ref 8.9–10.3)
Chloride: 94 mmol/L — ABNORMAL LOW (ref 98–111)
Creatinine, Ser: 3.59 mg/dL — ABNORMAL HIGH (ref 0.61–1.24)
GFR, Estimated: 18 mL/min — ABNORMAL LOW (ref >60)
Glucose, Bld: 168 mg/dL — ABNORMAL HIGH (ref 70–99)
Potassium: 4.9 mmol/L (ref 3.5–5.1)
Sodium: 133 mmol/L — ABNORMAL LOW (ref 135–145)

## 2022-05-28 LAB — GLUCOSE, CAPILLARY
Glucose-Capillary: 149 mg/dL — ABNORMAL HIGH (ref 70–99)
Glucose-Capillary: 228 mg/dL — ABNORMAL HIGH (ref 70–99)
Glucose-Capillary: 327 mg/dL — ABNORMAL HIGH (ref 70–99)
Glucose-Capillary: 219 mg/dL — ABNORMAL HIGH (ref 70–99)

## 2022-05-28 LAB — LACTATE DEHYDROGENASE: LDH: 185 U/L (ref 98–192)

## 2022-05-28 LAB — PROTIME-INR
INR: 1.6 — ABNORMAL HIGH (ref 0.8–1.2)
Prothrombin Time: 18.8 seconds — ABNORMAL HIGH (ref 11.4–15.2)

## 2022-05-28 LAB — MAGNESIUM: Magnesium: 2 mg/dL (ref 1.7–2.4)

## 2022-05-28 MED ORDER — METOPROLOL SUCCINATE ER 25 MG PO TB24
25.0000 mg | ORAL_TABLET | Freq: Every day | ORAL | Status: DC
  Administered 2022-05-28 – 2022-06-03 (×7): 25 mg via ORAL
  Filled 2022-05-28 (×7): qty 1

## 2022-05-28 MED ORDER — INSULIN ASPART 100 UNIT/ML IJ SOLN
5.0000 [IU] | Freq: Three times a day (TID) | INTRAMUSCULAR | Status: DC
  Administered 2022-05-28 – 2022-06-03 (×15): 5 [IU] via SUBCUTANEOUS

## 2022-05-28 MED ORDER — WARFARIN SODIUM 4 MG PO TABS
8.0000 mg | ORAL_TABLET | Freq: Once | ORAL | Status: AC
  Administered 2022-05-28: 8 mg via ORAL
  Filled 2022-05-28 (×2): qty 2

## 2022-05-28 MED ORDER — HYDRALAZINE HCL 50 MG PO TABS
50.0000 mg | ORAL_TABLET | Freq: Three times a day (TID) | ORAL | Status: DC
  Administered 2022-05-28 – 2022-05-29 (×3): 50 mg via ORAL
  Filled 2022-05-28 (×3): qty 1

## 2022-05-28 NOTE — Progress Notes (Signed)
CSW provided patient with requested resources for NA meetings. CSW also provided local outpatient programs in the event patient is interested. Patient states he will explore and assured CSW that he will attend NA meetings. CSW provided supportive intervention and discussed coping skills. Patient verbalizes understanding and has CSW contact info if needed. CSW will follow up in the clinic as needed. Lasandra Beech, LCSW, CCSW-MCS [phone]

## 2022-05-28 NOTE — Progress Notes (Addendum)
LVAD driveline with no yellow line. Did notice white tape wrapped around the drive-line cord immediately following plastic protective sheath proximal to insertion site. Writer asked how long tape has been there and if it was new (didn't appear new in color), and he stated, "just sometime since I have been in here."   Writer was patient's nurse on 05/24/2022 and did not notice tape being there then.

## 2022-05-28 NOTE — Progress Notes (Addendum)
LVAD Coordinator Rounding Note:  Admitted 05/13/22 to Dr Alford Highland service due to possible sepsis and drive line infection.   HM III LVAD implanted on 11/17/20 by Dr Vickey Sages under destination therapy criteria.    6/5 Admitted with sepsis/driveline infection. Started on vanc and zosyn . Planning for surgical debridement.  6/7 ID consulted.  6/8 Surgical debridement with deep cultures. 6/14 Worsening renal function. Entresto stopped.  6/15 Worsening renal function. Given 1 liters LR. Renal US normal. Hydralazine stopped.  6/18 Given 80 mg IV lasix. K running high, given lokelma.  6/19 Diuresed with 80 mg IV lasix + metolazone. K high given dose of lokelma. Hydralazine increased to 37.5 mg three times a day  Pt sitting up in bed watching TV. He is in a good mood today. States he would like to go home, but understands need for hospitalization at this time. Pt denies pain or nausea.   CR 3.59 this morning, pt is making urine.   Per ID: Discharge antibiotics to be given via PICC line:  Per pharmacy protocol DAPTOMYCIN 8mg /kg/48h Duration: 4 weeks from surgery  End Date: July 13th   Per July 15 note 05/23/22 [facility] has accepted pt for [facility] needs. Possible discharge later this week.  Wound dressing change completed today by VAD coordinator. Pictures of wound sent to Dr [facility]. Pt's wife Donata Clay unable to come to hospital today due to poor weather. Pt requested to not be pre-medicated prior to dressing change today.   Vital signs: Temp: 98.3 HR: 73 Doppler Pressure: 122 Automatic BP: 128/94 (103) O2 Sat: 100% on RA Wt: 173.1>173.0>175.2>172.6>175.7>182.7>188.1>187.4 lbs    LVAD interrogation reveals:  Speed: 5500 Flow: 4.2 Power: 4.1 w PI: 3.0 Hct: 29  Alarms: none  Events: 11 today  Fixed speed: 5500 Low speed limit: 5200   Drive Line: Pt's wife Kennith Center observed dressing change again today. Existing VAD dressing clean, dry, and intact. Existing VAD dressing  removed and site care performed using sterile technique. Drive line exit site cleaned with Chlora prep applicators x 2, allowed to dry, packed with 1 saline moistened 4 x 4 and covered with several 4 x 4. Small amount of thick yellow drainage noted in wound bed. Moderate amount of serous drainage noted on packing gauze and outer dressing. See picture below. No redness, tenderness, foul odor or rash noted. Drive line anchor correctly applied. Dr Kennith Center will change dressing again tomorrow afternoon between cases at bedside.      Labs:  LDH trend: 132>116>127>141>117>124>143>130>153>216>185  INR trend: 2.9>1.9>1.9>2.3>3.8>2.0>2.5>2.4>1.9>1.6  WBC: 17.8 (at [facility] [place])>15.6>11.2>10.9>11.4>12.8>11.8>12.4>15>14.7>13.7>16>15  Infection: 05/13/22>>Blood cultures>> no growth 4 days; final  05/13/22>>Drive line culture>>rare gram + cocci in pairs & rare gram + rods; final culture few normal skin flora 05/16/22>>Deep DL wound culture in OR>>rare gram + cocci in pairs; abundant staph auerus; final  05/16/22>>Fungus cx from OR>>negative; final 05/16/22>>AFB cx from OR>>MRSA  Anticoagulation Plan: -INR Goal: 2.0 - 2.5 -ASA Dose: none  Blood Products: 05/14/22>> 2 FFP  Drips:  Heparin- stopped  Plan/Recommendations:  1. Page VAD coordinator for VAD equipment or drive line issues 2. Daily wet to dry dressings with saline solution per Dr. 07/14/22.   Maren Beach RN VAD Coordinator  Office: [phone]  24/7 Pager: [phone]

## 2022-05-28 NOTE — Progress Notes (Signed)
Mobility Specialist Progress Note    05/28/22 1045  Mobility  Activity Ambulated independently in hallway  Level of Assistance Standby assist, set-up cues, supervision of patient - no hands on  Assistive Device None  Distance Ambulated (ft) 800 ft  Activity Response Tolerated well  $Mobility charge 1 Mobility   Post-Mobility: 91 HR  Pt received standing in room and agreeable. No complaints on walk. Returned to chair with call bell in reach and pt stated he wanted to stay on batteries.   [place] Mobility Specialist

## 2022-05-28 NOTE — Progress Notes (Addendum)
Advanced Heart Failure VAD Team Note  PCP-Cardiologist: Nona Dell, MD   Subjective:   Admit weight 173--->188 pounds.   6/5 Admitted with sepsis/driveline infection. Started on vanc and zosyn . Planning for surgical debridement.  6/7 ID consulted.  6/8 Surgical debridement with deep cultures. -> Wound cx with MRSA 6/14 Worsening renal function. Entresto stopped.  6/15 Worsening renal function. Given 1 liters LR. Renal US normal. Hydralazine stopped.  6/18 Given 80 mg IV lasix. K running high, given lokelma.  6/19 Diuresed with 80 mg IV lasix + metolazone. K high given dose of lokelma. Hydralazine increased to 37.5 mg three times a day.    WBC 10.6>16.2>16 >15 (got prednisone 6/17, 6/18, 6/19)   Scr continues to improve 4.6-> 4.4>4.17>3.56   INR 1.6 today    Denies SOB. Wants to go home. He tells me when got hydrocodone he would sell it to get his drugs.    LVAD INTERROGATION:  HeartMate III LVAD:   Flow 3.8 liters/min, speed 5500, power 5 PI 4    8 PI events since MN.   Objective:    Vital Signs:   Temp:  [97.6 F (36.4 C)-98.7 F (37.1 C)] 98.2 F (36.8 C) (06/20 0519) Pulse Rate:  [79-90] 84 (06/20 0519) Resp:  [14-20] 19 (06/20 0519) BP: (122-132)/(87-94) 123/88 (06/20 0520) SpO2:  [93 %-100 %] 100 % (06/20 0519) Weight:  [85 kg] 85 kg (06/20 0519) Last BM Date : 05/26/22 Mean arterial Pressure 120-130s   Intake/Output:   Intake/Output Summary (Last 24 hours) at 05/28/2022 0815 Last data filed at 05/28/2022 0550 Gross per 24 hour  Intake 778 ml  Output 3400 ml  Net -2622 ml     Physical Exam   CVp 4-5  Physical Exam: GENERAL: No acute distress. HEENT: normal  NECK: Supple, JVP flat   .  2+ bilaterally, no bruits.  No lymphadenopathy or thyromegaly appreciated.   CARDIAC:  Mechanical heart sounds with LVAD hum present.  LUNGS: No wheeze.  Clear to auscultation bilaterally.  ABDOMEN:  Soft, round, nontender, positive bowel sounds x4.     LVAD exit  site:  Dressing dry and intact.  No erythema or drainage.  Stabilization device present and accurately applied.   EXTREMITIES:  Warm and dry, no cyanosis, clubbing, rash or edema . RUE PICC.  NEUROLOGIC:  Alert and oriented x 3.    No aphasia.  No dysarthria.  Affect pleasant.     Telemetry  SR 70-80s personally checked.   EKG    N/A  Labs   Basic Metabolic Panel: Recent Labs  Lab 05/24/22 0430 05/25/22 0342 05/26/22 0333 05/27/22 0430 05/28/22 0558  NA 131* 132* 132* 133* 133*  K 4.5 4.5 5.7* 5.6* 4.9  CL 94* 95* 95* 94* 94*  CO2 26 24 24 28 27   GLUCOSE 182* 76 196* 252* 168*  BUN 49* 56* 61* 68* 66*  CREATININE 4.28* 4.63* 4.42* 4.17* 3.59*  CALCIUM 8.3* 8.6* 8.8* 8.7* 8.8*  MG 2.0 2.3 2.1 2.2 2.0    Liver Function Tests: Recent Labs  Lab 05/25/22 0342  AST 35  ALT 49*  ALKPHOS 90  BILITOT 0.3  PROT 6.4*  ALBUMIN 2.6*   No results for input(s): "LIPASE", "AMYLASE" in the last 168 hours. No results for input(s): "AMMONIA" in the last 168 hours.  CBC: Recent Labs  Lab 05/24/22 0430 05/25/22 0342 05/26/22 0333 05/27/22 0430 05/28/22 0558  WBC 13.7* 19.6* 16.2* 16.0* 15.0*  HGB 9.8* 10.3* 9.2* 9.6*  9.6*  HCT 29.6* 31.9* 27.4* 29.0* 28.8*  MCV 80.7 82.0 81.1 80.8 80.9  PLT 265 318 315 374 377    INR: Recent Labs  Lab 05/24/22 0430 05/25/22 0342 05/26/22 0333 05/27/22 0430 05/28/22 0558  INR 2.4* 2.1* 2.1* 1.9* 1.6*    Other results: EKG:    Imaging   No results found.   Medications:     Scheduled Medications:  vitamin C  500 mg Oral Daily   Chlorhexidine Gluconate Cloth  6 each Topical Daily   docusate sodium  100 mg Oral Daily   fluticasone  1 spray Each Nare Daily   gabapentin  300 mg Oral BID   guaiFENesin  600 mg Oral BID   hydrALAZINE  37.5 mg Oral Q8H   insulin aspart  0-15 Units Subcutaneous TID WC   insulin aspart  0-5 Units Subcutaneous QHS   insulin aspart  3 Units Subcutaneous TID WC   insulin glargine-yfgn  15  Units Subcutaneous Daily   ipratropium-albuterol  3 mL Nebulization BID   lactose free nutrition  237 mL Oral TID WC   lidocaine  1 patch Transdermal Q24H   loratadine  10 mg Oral Daily   methimazole  20 mg Oral BID   multivitamin with minerals  1 tablet Oral Daily   pantoprazole  40 mg Oral Daily   polyethylene glycol  17 g Oral BID   senna-docusate  2 tablet Oral Daily   sertraline  50 mg Oral QHS   sodium chloride flush  10-40 mL Intracatheter Q12H   sodium chloride flush  3 mL Intravenous Q12H   traMADol  50 mg Oral Q12H   traZODone  150 mg Oral QHS   warfarin  8 mg Oral ONCE-1600   Warfarin - Pharmacist Dosing Inpatient   Does not apply q1600   zinc sulfate  220 mg Oral BID    Infusions:  sodium chloride Stopped (05/18/22 1622)   DAPTOmycin (CUBICIN) 650 mg in sodium chloride 0.9 % IVPB Stopped (05/27/22 0116)    PRN Medications: sodium chloride, acetaminophen, albuterol, ondansetron (ZOFRAN) IV, mouth rinse, sodium chloride flush, sodium chloride flush, sodium hypochlorite   Patient Profile  Mr Nay is a 61 year old with h/o HFrEF, HMIII LVAD, CAD, S/P CABG 2020 with Maze/LAA clipping, DMII, COPD, HTN, PAF, and previous ETOH abuse.    Admitted after syncope/driveline infection.     Assessment/Plan:   1. ID: Driveline infection.  CT abdomen/pelvis with small fluid collection along pre-peritoneal driveline.  Early sepsis. Lactate initially elevated.  MAP improved with IV fluid, creatinine lower. . S/p driveline site debridement 6/8. Surgical wound cultures growing MRSA  Blood Cx NGTD.  - AF today. WBCs 15 today. (Got 3 days of prednisone 617-6/19).  - ID following.Vanc switched to Dapto with completion day July 13 th. OPAT orders placed. PICC placed - HH orders placed.  TOC CM following for home antibiotics.  - Prealbumin 15. Dietitian appreciated. Started on supplements. Appetite improved - Seen by Dr. Norina Buzzard 6/16 wound healing well. Continue wet-to-dry dressings - No  pain. Would not send home on narcotics with h/u meth use and selling narcotics.  . Discussed plan to eventually switch to tylenol.  2.  Acute on chronic systolic CHF: Echo 10/21 with EF 20-25%, mildly decreased RV function. LHC/RHC in 12/21 with patent grafts, low output. Suspect mixed ischemic/nonischemic cardiomyopathy (prior heavy ETOH and drugs as well as CAD).  No ETOH, drugs, smoking since CABG in 12/20. Admitted with cardiogenic shock  in 12/21, had placement of Impella 5.5 initially, now s/p Heartmate 3 LVAD on 11/17/20.  Ramp echo 2/23 with speed decreased to 5500 rpm.  NYHA class II - Appears euvolemic.  CVP 5 -6 today.  -Had AKI and taken off hydralazine and entrest. Hydralazine restarted Maps > 100s. Continue hydralazine 37.5 mg three times a day.  - Restart 25 mg Toprol XL daily. (Home dose)  - INR 1.6 today. Goal 2-2.5. Discussed dosing with PharmD personally. - LDH  stable.  3. AKI  - Creatinine starting to improve 0.9>1.6>3.2>3.7>4.28>4.63> 4.42 >4.17>3.59  - Initially thought this was ATN from low BP but he was never markedly hypotensive and LFTs without any evidence of shock liver. ? Vanc toxicity (troughs were ok). Taken off entresto and hydralazine. Volume repleted with IVF.  - UA ok. Renal US negative.  - Hydralazine restarted with hypertension.  -Continue to make ~ 3.9 liters  Not uremic. 4. CAD: S/p CABG 12/20.  LHC pre-VAD with patent grafts, no target for intervention. - No s/s angina - Continue statin.  5. Atrial fibrillation: Paroxysmal.  S/p Maze and LA appendage clip with CABG in 12/20. Maintaining NSR . Prior to admit he was on 25 mg Toprol XL - Restart Toprol XL 25 mg daily.  - Off amiodarone with hyperthyroidism. - Coumadin as above 6. Type 2 diabetes: SSI. Running higher. Consulted diabetes coordinator.  -Semglee added. - Continue SSI.  - Improved off prednisone.  7. 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 Wants rehab. UDS negative 8.  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  He missed his endocrinology appt.  - He is now off amiodarone.   - Continue methimazole 20 mg bid, TSH was mildly elevated.   - Will need to reschedule endocrine appt.   9. Moderate Malnutrition -Prealbumin 15. -Dietitian consulted. Started on supplements. Diet liberalized.  - Appetite better when nausea resolved off vanc - Vit C , MVI, and Zinc started. 10. L wrist pain -> suspect acute gout - Uric acid normal  - Improved with prednisone 11. Hyperkalemia - Received Lokelma 6/18 and 6/19  K 4.9 today.  -12. COPD  - No wheeze today.  Completed  3 days prednisone. Changed to scheduled nebs.   [name] for home antibiotics. [name] will supply antibiotics.   I reviewed the LVAD parameters from today, and compared the results to the patient's prior recorded data.  No programming changes were made.  The LVAD is functioning within specified parameters.  The patient performs LVAD self-test daily.  LVAD interrogation was negative for any significant power changes, alarms or PI events/speed drops.  LVAD equipment check completed and is in good working order.  Back-up equipment present.   LVAD education done on emergency procedures and precautions and reviewed exit site care.  Length of Stay: 15  Tonye Becket, NP 05/28/2022, 8:15 AM  VAD Team --- VAD ISSUES ONLY--- Pager [phone] (7am - 7am)  Advanced Heart Failure Team  Pager [phone] (M-F; 7a - 5p)  Please contact [facility] for night-coverage after hours (5p -7a ) and weekends on [email or web address]  Patient seen and examined with the above-signed Advanced Practice Provider and/or Housestaff. I personally reviewed laboratory data, imaging studies and relevant notes. I independently examined the patient and formulated the important aspects of the plan. I have edited the note to reflect any of my changes or salient points. I have personally discussed the plan with the patient and/or  family.  Much improved. Denies fevers, chills.  Volume status better with IV diuresis over the past 2 days. Scr improving. MAPs elevated.   General:  NAD.  HEENT: normal  Neck: supple. JVP not elevated.  Carotids 2+ bilat; no bruits. No lymphadenopathy or thryomegaly appreciated. Cor: LVAD hum.  Lungs: Clear. Abdomen: obese soft, nontender, non-distended. No hepatosplenomegaly. No bruits or masses. Good bowel sounds. Driveline site clean dressing. Anchor in place.  Extremities: no cyanosis, clubbing, rash. Warm no edema  Neuro: alert & oriented x 3. No focal deficits. Moves all 4 without problem   Volume status looks much better. MAPs elevated. Renal function improving.   Increase hydralazine.. Wean off pain meds. If renal function continues to improve may be able to go home tomorrow.   VAD interrogated personally. Parameters stable.  INR 1.6. Discussed dosing with PharmD personally.  Arvilla Meres, MD  9:04 AM

## 2022-05-28 NOTE — Progress Notes (Signed)
ANTICOAGULATION CONSULT NOTE   Pharmacy Consult for Warfarin Indication:  LVAD  No Known Allergies  Patient Measurements: Height: 5' 10.5" (179.1 cm) Weight: 85 kg (187 lb 6.3 oz) IBW/kg (Calculated) : 74.15   Vital Signs: Temp: 98.2 F (36.8 C) (06/20 0519) Temp Source: Oral (06/20 0519) BP: 123/88 (06/20 0520) Pulse Rate: 84 (06/20 0519)  Labs: Recent Labs    05/26/22 0333 05/27/22 0430 05/28/22 0558  HGB 9.2* 9.6* 9.6*  HCT 27.4* 29.0* 28.8*  PLT 315 374 377  LABPROT 23.4* 21.4* 18.8*  INR 2.1* 1.9* 1.6*  CREATININE 4.42* 4.17* 3.59*     Estimated Creatinine Clearance: 22.7 mL/min (A) (by C-G formula based on SCr of 3.59 mg/dL (H)).   Medical History: Past Medical History:  Diagnosis Date   "    Arthritis    CAD (coronary artery disease)    a. s/p CABG in 11/2019 with LIMA-LAD, SVG-OM1, SVG-PDA and SVG-D1   CHF (congestive heart failure) (HCC)    a. EF < 20% by echo in 11/2019   Essential hypertension    PAF (paroxysmal atrial fibrillation) (HCC)    Type 2 diabetes mellitus (HCC)     Assessment: 61yom with LVAD HM3 implanted 2021, admitted for drive line infection.  INR 2.6 on admit- no bleeding, h/h stable   S/p debridement and surgery ok to resume warfarin 6/8 with no heparin.   INR down to 1.6 this AM -was keeping on low end of goal with possible I&D in future but will need to be more aggressive with dose as discharge soon seems likely.  No bleeding issues or oozing from wound site. Hemoglobin stable at 9.6. Pltc ok. LDH down 180s this morning.   Of note patient on boost nutritional supplements to help promote wound healing. It is questionable if these will continue after discharge.    PTA warfarin 4mg  TTSS / 6mg  MWF  Goal of Therapy:  INR 2-2.5 Monitor platelets by anticoagulation protocol: Yes   Plan:  Warfarin 8 mg today Daily INR  If discharging today would give 8mg  today and tomorrow then resume home regimen on Thursday 6/22. Would  have INR rechecked early next week pending availability.   PharmD., BCPS Clinical Pharmacist 05/28/2022 7:55 AM

## 2022-05-29 ENCOUNTER — Inpatient Hospital Stay (HOSPITAL_COMMUNITY): Payer: 59

## 2022-05-29 ENCOUNTER — Other Ambulatory Visit (HOSPITAL_COMMUNITY): Payer: Self-pay | Admitting: *Deleted

## 2022-05-29 DIAGNOSIS — T827XXA Infection and inflammatory reaction due to other cardiac and vascular devices, implants and grafts, initial encounter: Secondary | ICD-10-CM | POA: Diagnosis not present

## 2022-05-29 DIAGNOSIS — Z95811 Presence of heart assist device: Secondary | ICD-10-CM | POA: Diagnosis not present

## 2022-05-29 DIAGNOSIS — N179 Acute kidney failure, unspecified: Secondary | ICD-10-CM | POA: Diagnosis not present

## 2022-05-29 DIAGNOSIS — E0590 Thyrotoxicosis, unspecified without thyrotoxic crisis or storm: Secondary | ICD-10-CM

## 2022-05-29 DIAGNOSIS — T829XXA Unspecified complication of cardiac and vascular prosthetic device, implant and graft, initial encounter: Secondary | ICD-10-CM | POA: Diagnosis not present

## 2022-05-29 LAB — CBC
HCT: 26.7 % — ABNORMAL LOW (ref 39.0–52.0)
Hemoglobin: 8.7 g/dL — ABNORMAL LOW (ref 13.0–17.0)
MCH: 26.9 pg (ref 26.0–34.0)
MCHC: 32.6 g/dL (ref 30.0–36.0)
MCV: 82.4 fL (ref 80.0–100.0)
Platelets: 354 10*3/uL (ref 150–400)
RBC: 3.24 MIL/uL — ABNORMAL LOW (ref 4.22–5.81)
RDW: 16.1 % — ABNORMAL HIGH (ref 11.5–15.5)
WBC: 15.7 10*3/uL — ABNORMAL HIGH (ref 4.0–10.5)
nRBC: 0 % (ref 0.0–0.2)

## 2022-05-29 LAB — BASIC METABOLIC PANEL
Anion gap: 11 (ref 5–15)
BUN: 67 mg/dL — ABNORMAL HIGH (ref 8–23)
CO2: 28 mmol/L (ref 22–32)
Calcium: 8.5 mg/dL — ABNORMAL LOW (ref 8.9–10.3)
Chloride: 94 mmol/L — ABNORMAL LOW (ref 98–111)
Creatinine, Ser: 3.62 mg/dL — ABNORMAL HIGH (ref 0.61–1.24)
GFR, Estimated: 18 mL/min — ABNORMAL LOW (ref >60)
Glucose, Bld: 144 mg/dL — ABNORMAL HIGH (ref 70–99)
Potassium: 4.9 mmol/L (ref 3.5–5.1)
Sodium: 133 mmol/L — ABNORMAL LOW (ref 135–145)

## 2022-05-29 LAB — GLUCOSE, CAPILLARY
Glucose-Capillary: 165 mg/dL — ABNORMAL HIGH (ref 70–99)
Glucose-Capillary: 181 mg/dL — ABNORMAL HIGH (ref 70–99)
Glucose-Capillary: 214 mg/dL — ABNORMAL HIGH (ref 70–99)
Glucose-Capillary: 274 mg/dL — ABNORMAL HIGH (ref 70–99)

## 2022-05-29 LAB — MAGNESIUM: Magnesium: 2 mg/dL (ref 1.7–2.4)

## 2022-05-29 LAB — LACTATE DEHYDROGENASE: LDH: 136 U/L (ref 98–192)

## 2022-05-29 LAB — CK: Total CK: 46 U/L — ABNORMAL LOW (ref 49–397)

## 2022-05-29 LAB — PROTIME-INR
INR: 1.7 — ABNORMAL HIGH (ref 0.8–1.2)
Prothrombin Time: 19.6 seconds — ABNORMAL HIGH (ref 11.4–15.2)

## 2022-05-29 MED ORDER — INSULIN GLARGINE-YFGN 100 UNIT/ML ~~LOC~~ SOLN
18.0000 [IU] | Freq: Every day | SUBCUTANEOUS | Status: DC
  Administered 2022-05-29: 18 [IU] via SUBCUTANEOUS
  Filled 2022-05-29 (×2): qty 0.18

## 2022-05-29 MED ORDER — WARFARIN SODIUM 3 MG PO TABS
6.0000 mg | ORAL_TABLET | Freq: Once | ORAL | Status: AC
Start: 2022-05-29 — End: 2022-05-29
  Administered 2022-05-29: 6 mg via ORAL
  Filled 2022-05-29: qty 2

## 2022-05-29 MED ORDER — HYDRALAZINE HCL 50 MG PO TABS
75.0000 mg | ORAL_TABLET | Freq: Three times a day (TID) | ORAL | Status: DC
Start: 2022-05-29 — End: 2022-05-30
  Administered 2022-05-29 – 2022-05-30 (×3): 75 mg via ORAL
  Filled 2022-05-29 (×3): qty 1

## 2022-05-29 NOTE — TOC Initial Note (Signed)
Transition of Care [facility]) - Initial/Assessment Note    Patient Details  Name: Kevin Underwood MRN: [id number] Date of Birth: 1961/08/22  Transition of Care [facility]) CM/SW Contact:    [name], RN Phone Number: [phone] 05/29/2022, 9:19 AM  Clinical Narrative:                 HF TOC CM contacted [name] to make aware wife will do dressings changes at home.   Expected Discharge Plan: Home w Home Health Services Barriers to Discharge: No Barriers Identified   Patient Goals and CMS Choice Patient states their goals for this hospitalization and ongoing recovery are:: just want to feel better and go home CMS Medicare.gov Compare Post Acute Care list provided to:: Patient Choice offered to / list presented to : Patient  Expected Discharge Plan and Services Expected Discharge Plan: Home w Home Health Services   Discharge Planning Services: CM Consult Post Acute Care Choice: Home Health Living arrangements for the past 2 months: Single Family Home                           HH Arranged: RN HH Agency: Therapist, art) Date HH Agency Contacted: 05/29/22 Time HH Agency Contacted: [phone] Representative spoke with at [facility] Agency: [name]  Prior Living Arrangements/Services Living arrangements for the past 2 months: Single Family Home Lives with:: Spouse Patient language and need for interpreter reviewed:: Yes Do you feel safe going back to the place where you live?: Yes      Need for Family Participation in Patient Care: Yes (Comment) Care giver support system in place?: Yes (comment) Current home services: DME (LVAD, nebulizer machine) Criminal Activity/Legal Involvement Pertinent to Current Situation/Hospitalization: No - Comment as needed  Activities of Daily Living Home Assistive Devices/Equipment: None ADL Screening (condition at time of admission) Patient's cognitive ability adequate to safely complete daily activities?: Yes Is the patient deaf or have  difficulty hearing?: No Does the patient have difficulty seeing, even when wearing glasses/contacts?: No Does the patient have difficulty concentrating, remembering, or making decisions?: No Patient able to express need for assistance with ADLs?: No Does the patient have difficulty dressing or bathing?: No Independently performs ADLs?: Yes (appropriate for developmental age) Does the patient have difficulty walking or climbing stairs?: No Weakness of Legs: None Weakness of Arms/Hands: None  Permission Sought/Granted Permission sought to share information with : Case Manager, Family Supports, PCP Permission granted to share information with : Yes, Verbal Permission Granted  Share Information with NAME: Tamel Abel  Permission granted to share info w AGENCY: Home Health  Permission granted to share info w Relationship: wife  Permission granted to share info w Contact Information: [phone]  Emotional Assessment Appearance:: Appears stated age Attitude/Demeanor/Rapport: Other (comment) (feeling ill) Affect (typically observed): Accepting Orientation: : Oriented to Self, Oriented to Place, Oriented to  Time, Oriented to Situation   Psych Involvement: No (comment)  Admission diagnosis:  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) [S01.9XXA] Patient Active Problem List   Diagnosis Date Noted   MRSA infection    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 (LVAD) (HCC)    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) 05/13/2022   AKI (acute kidney injury) (HCC) 05/13/2022   Hyperthyroidism 01/22/2022   Chronic systolic CHF (congestive heart failure) (HCC)    COPD (chronic obstructive pulmonary disease) (HCC) 09/09/2021   Malnutrition of moderate degree 11/17/2020  CHF (congestive heart failure), NYHA class IV, chronic, systolic (HCC) 11/17/2020   Palliative care by specialist    Goals of care, counseling/discussion     Hyperlipidemia    CHF exacerbation (HCC) 09/23/2020   Unstable angina (HCC) 09/22/2020   LVAD (left ventricular assist device) present (HCC)    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   Cardiogenic shock (HCC)    Acute respiratory failure (HCC)    RUQ abdominal pain    Cardiomyopathy (HCC) 11/21/2019   Acute systolic CHF (congestive heart failure) (HCC) 11/21/2019   History of alcohol abuse Quit 6 mos ago 11/21/2019   History of recreational drug use 11/21/2019   PAF (paroxysmal atrial fibrillation) (HCC) 11/21/2019   Cardiac volume overload 11/21/2019   Elevated brain natriuretic peptide (BNP) level 11/21/2019   Leg edema, right 11/21/2019   Hyponatremia 11/21/2019   Abnormal electrocardiogram (ECG) (EKG) 11/21/2019   Hyperglycemia 11/21/2019   Tremulousness 11/21/2019   Restlessness and agitation 11/21/2019   OSA (obstructive sleep apnea) 11/21/2019   Atrial fibrillation with RVR (HCC) 11/20/2019   Acute congestive heart failure (HCC) 11/20/2019   Acute respiratory distress 11/20/2019   COPD with acute exacerbation (HCC)    Hypertension    Type 2 diabetes mellitus (HCC)    Closed fracture of 5th metacarpal 08/31/2012   PCP:  Kirstie Peri, MD Pharmacy:   [phone] - [place], [place] - [address] [facility] [address] [place] [address] Phone: [phone] Fax: [phone]  [facility] [address] [place] [address] Phone: [phone] Fax: [phone]     Social Determinants of Health (SDOH) Interventions Financial Strain Interventions: Intervention Not Indicated  Readmission Risk Interventions    11/28/2020   11:57 AM  Readmission Risk Prevention Plan  Transportation Screening Complete  Medication Review (RN Care Manager) Complete  HRI or Home Care Consult Complete  SW Recovery Care/Counseling Consult Complete  Palliative Care Screening Not Applicable  Skilled Nursing Facility Not Applicable

## 2022-05-29 NOTE — Progress Notes (Signed)
ANTICOAGULATION CONSULT NOTE   Pharmacy Consult for Warfarin Indication:  LVAD  No Known Allergies  Patient Measurements: Height: 5' 10.5" (179.1 cm) Weight: 86 kg (189 lb 9.5 oz) IBW/kg (Calculated) : 74.15   Vital Signs: Temp: 98.2 F (36.8 C) (06/21 0354) Temp Source: Oral (06/21 0354) BP: 131/101 (06/21 0354) Pulse Rate: 79 (06/21 0354)  Labs: Recent Labs    05/27/22 0430 05/28/22 0558 05/29/22 0510  HGB 9.6* 9.6* 8.7*  HCT 29.0* 28.8* 26.7*  PLT 374 377 354  LABPROT 21.4* 18.8* 19.6*  INR 1.9* 1.6* 1.7*  CREATININE 4.17* 3.59* 3.62*  CKTOTAL  --   --  46*     Estimated Creatinine Clearance: 22.5 mL/min (A) (by C-G formula based on SCr of 3.62 mg/dL (H)).   Medical History: Past Medical History:  Diagnosis Date   "    Arthritis    CAD (coronary artery disease)    a. s/p CABG in 11/2019 with LIMA-LAD, SVG-OM1, SVG-PDA and SVG-D1   CHF (congestive heart failure) (HCC)    a. EF < 20% by echo in 11/2019   Essential hypertension    PAF (paroxysmal atrial fibrillation) (HCC)    Type 2 diabetes mellitus (HCC)     Assessment: 61yom with LVAD HM3 implanted 2021, admitted for drive line infection.  INR 2.6 on admit- no bleeding, h/h stable   S/p debridement and surgery ok to resume warfarin 6/8 with no heparin.   INR up to 1.7 this AM. No bleeding issues noted. Hemoglobin down this morning to 8.7, will watch. Pltc ok. LDH down 130s this morning.   Of note patient on boost nutritional supplements to help promote wound healing. It is questionable if these will continue after discharge.    PTA warfarin 4mg  TTSS / 6mg  MWF  Goal of Therapy:  INR 2-2.5 Monitor platelets by anticoagulation protocol: Yes   Plan:  Warfarin 6 mg today If discharged today would resume home dose with close outpatient follow up Daily INR  PharmD., BCPS Clinical Pharmacist 05/29/2022 6:15 AM

## 2022-05-29 NOTE — Patient Instructions (Addendum)
VAD clinic follow up appointment with Dr Shirlee Latch scheduled 06/07/22 at 11:00 Endocrinology follow up appointment scheduled 07/04/22 at 2:30 pm at [facility]: [address] [place], [place] [address]  [phone] Bring patient information packet they mail you to this appointment

## 2022-05-29 NOTE — Progress Notes (Addendum)
LVAD Coordinator Rounding Note:  Admitted 05/13/22 to Dr Alford Highland service due to possible sepsis and drive line infection.   HM III LVAD implanted on 11/17/20 by Dr Vickey Sages under destination therapy criteria.    6/5 Admitted with sepsis/driveline infection. Started on vanc and zosyn . Planning for surgical debridement.  6/7 ID consulted.  6/8 Surgical debridement with deep cultures. 6/14 Worsening renal function. Entresto stopped.  6/15 Worsening renal function. Given 1 liters LR. Renal US normal. Hydralazine stopped.  6/18 Given 80 mg IV lasix. K running high, given lokelma.  6/19 Diuresed with 80 mg IV lasix + metolazone. K high given dose of lokelma. Hydralazine increased to 37.5 mg three times a day  Pt sitting up on side of bed finishing breakfast. States he would like to go home, but understands need for hospitalization at this time. Pt denies pain or nausea. States he "had a rough night" with "body aches and chills".  He was afebrile overnight. Discussed with Mardella Layman PA- will obtain chest xray and repeat blood cultures.   CR 3.62 this morning, pt is making urine.   Per ID: Discharge antibiotics to be given via PICC line:  Per pharmacy protocol DAPTOMYCIN 8mg /kg/48h Duration: 4 weeks from surgery  End Date: July 13th   Per July 15 note 05/23/22 [facility] accepted [facility] for [facility] needs. Possible discharge later this week.  Wound dressing change completed today by pt's wife [facility]. Discussed in detail how to set up her sterile field prior to starting dressing change with the addition of saline moistened 4 x 4 to place in wound bed. She verbalized understanding. Performed dressing change with minimal verbal cues from VAD coordinator.   Provided pt with 14 daily dressing kits, 10 anchors, sterile saline, and 4 x 4s for home use.   Follow up appointment scheduled with [facility] at their [place] office as pt has missed appts at [facility] office due to  transportation. Appt schedule 07/04/22 at 2:30 pm. Office address and phone number provided to pt and his wife: [address] [place]. [phone]. Pt to bring patient packet they mail to him with him to appt. They both verbalized understanding.   Vital signs: Temp: 97.9 HR: 76 Doppler Pressure: 114 Automatic BP: 123/87 (98) O2 Sat: 97% on RA Wt: 173.1>173.0>175.2>172.6>175.7>182.7>188.1>187.4>189.6 lbs    LVAD interrogation reveals:  Speed: 5500 Flow: 4.3 Power: 4.0 w PI: 3.0 Hct: 29  Alarms: NO EXTERNAL POWER this morning. Pt reports equipment was tangled and he was fixing this Events: 10 today  Fixed speed: 5500 Low speed limit: 5200   Drive Line: Pt's wife [phone] performed dressing change again today. Existing VAD dressing clean, dry, and intact. Existing VAD dressing removed and site care performed using sterile technique. Drive line exit site cleaned with Chlora prep applicators x 2, allowed to dry, packed with 1 saline moistened 4 x 4 and covered with several 4 x 4. Small amount of thick yellow drainage noted in wound bed; patted dry with sterile 4 x 4. Moderate amount of serous drainage noted on packing gauze and outer dressing. No redness, tenderness, foul odor or rash noted. Drive line anchor correctly applied. Daily dressing changes per VAD coordinator with pt's wife if she is available. Next dressing change due 05/30/22.      Labs:  LDH trend: 132>116>127>141>117>124>143>130>153>216>185>136  INR trend: 2.9>1.9>1.9>2.3>3.8>2.0>2.5>2.4>1.9>1.6>1.7  WBC: 17.8 (at [facility] [place])>15.6>11.2>10.9>11.4>12.8>11.8>12.4>15>14.7>13.7>16>15>15.7  Infection: 05/13/22>>Blood cultures>> no growth 4 days; final  05/13/22>>Drive line culture>>rare gram + cocci in  pairs & rare gram + rods; final culture few normal skin flora 05/16/22>>Deep DL wound culture in OR>>rare gram + cocci in pairs; abundant staph auerus; final  05/16/22>>Fungus cx from OR>>negative; final 05/16/22>>AFB  cx from OR>>MRSA  Anticoagulation Plan: -INR Goal: 2.0 - 2.5 -ASA Dose: none  Blood Products: 05/14/22>> 2 FFP  Drips:  Heparin- stopped  Plan/Recommendations:  1. Page VAD coordinator for VAD equipment or drive line issues 2. Daily wet to dry dressings with saline solution per VAD coordinator with pt's wife  Alyce Pagan RN VAD Coordinator  Office: [phone]  24/7 Pager: [phone]

## 2022-05-29 NOTE — Progress Notes (Addendum)
Advanced Heart Failure VAD Team Note  PCP-Cardiologist: Nona Dell, MD   Subjective:   Admit weight 173--->189 pounds.   6/5 Admitted with sepsis/driveline infection. Started on vanc and zosyn . Planning for surgical debridement.  6/7 ID consulted.  6/8 Surgical debridement with deep cultures. -> Wound cx with MRSA 6/14 Worsening renal function. Entresto stopped.  6/15 Worsening renal function. Given 1 liters LR. Renal US normal. Hydralazine stopped.  6/18 Given 80 mg IV lasix. K running high, given lokelma.  6/19 Diuresed with 80 mg IV lasix + metolazone. K high given dose of lokelma. Hydralazine increased to 37.5 mg three times a day.   WBC 10.6>16.2>16 >15 (got prednisone 6/17, 6/18, 6/19)   Scr overall improved but starting to trend back up 4.6-> 4.4>4.17>3.56>>3.62 today   1.9L in UOP yesterday. CVP 5-6   INR 1.7   MAPs remain elevated, low 100s   Resting comfortably. No current complaints.    LVAD INTERROGATION:  HeartMate III LVAD:   Flow 4.0 liters/min, speed 5500, power 3.9 PI 3.3  7PI events   Objective:    Vital Signs:   Temp:  [97.5 F (36.4 C)-98.3 F (36.8 C)] 98.2 F (36.8 C) (06/21 0354) Pulse Rate:  [71-90] 79 (06/21 0354) Resp:  [16-19] 18 (06/21 0354) BP: (118-146)/(80-101) 131/101 (06/21 0354) SpO2:  [89 %-100 %] 89 % (06/21 0354) Weight:  [86 kg] 86 kg (06/21 0500) Last BM Date : 05/26/22 Mean arterial Pressure low 100s   Intake/Output:   Intake/Output Summary (Last 24 hours) at 05/29/2022 0753 Last data filed at 05/29/2022 0503 Gross per 24 hour  Intake 303 ml  Output 1900 ml  Net -1597 ml     Physical Exam    CVP 5-6  GENERAL: No acute distress. HEENT: normal  NECK: Supple, JVP 6 cm 2+ bilaterally, no bruits.  No lymphadenopathy or thyromegaly appreciated.   CARDIAC:  Mechanical heart sounds with LVAD hum present.  LUNGS: CTAB  ABDOMEN:  Soft, round, nontender, positive bowel sounds x4.     LVAD exit site:  Dressing dry and  intact.  No erythema or drainage.  Stabilization device present and accurately applied.   EXTREMITIES:  Warm and dry, no cyanosis, clubbing, rash or edema. + RUE PICC   NEUROLOGIC:  Alert and oriented x 3.    No aphasia.  No dysarthria.  Affect pleasant.     Telemetry   NSR 70s .   EKG    N/A  Labs   Basic Metabolic Panel: Recent Labs  Lab 05/25/22 0342 05/26/22 0333 05/27/22 0430 05/28/22 0558 05/29/22 0510  NA 132* 132* 133* 133* 133*  K 4.5 5.7* 5.6* 4.9 4.9  CL 95* 95* 94* 94* 94*  CO2 24 24 28 27 28   GLUCOSE 76 196* 252* 168* 144*  BUN 56* 61* 68* 66* 67*  CREATININE 4.63* 4.42* 4.17* 3.59* 3.62*  CALCIUM 8.6* 8.8* 8.7* 8.8* 8.5*  MG 2.3 2.1 2.2 2.0 2.0    Liver Function Tests: Recent Labs  Lab 05/25/22 0342  AST 35  ALT 49*  ALKPHOS 90  BILITOT 0.3  PROT 6.4*  ALBUMIN 2.6*   No results for input(s): "LIPASE", "AMYLASE" in the last 168 hours. No results for input(s): "AMMONIA" in the last 168 hours.  CBC: Recent Labs  Lab 05/25/22 0342 05/26/22 0333 05/27/22 0430 05/28/22 0558 05/29/22 0510  WBC 19.6* 16.2* 16.0* 15.0* 15.7*  HGB 10.3* 9.2* 9.6* 9.6* 8.7*  HCT 31.9* 27.4* 29.0* 28.8* 26.7*  MCV  82.0 81.1 80.8 80.9 82.4  PLT 318 315 374 377 354    INR: Recent Labs  Lab 05/25/22 0342 05/26/22 0333 05/27/22 0430 05/28/22 0558 05/29/22 0510  INR 2.1* 2.1* 1.9* 1.6* 1.7*    Other results: EKG:    Imaging   No results found.   Medications:     Scheduled Medications:  vitamin C  500 mg Oral Daily   Chlorhexidine Gluconate Cloth  6 each Topical Daily   docusate sodium  100 mg Oral Daily   fluticasone  1 spray Each Nare Daily   gabapentin  300 mg Oral BID   guaiFENesin  600 mg Oral BID   hydrALAZINE  50 mg Oral Q8H   insulin aspart  0-15 Units Subcutaneous TID WC   insulin aspart  0-5 Units Subcutaneous QHS   insulin aspart  5 Units Subcutaneous TID WC   insulin glargine-yfgn  18 Units Subcutaneous Daily    ipratropium-albuterol  3 mL Nebulization BID   lactose free nutrition  237 mL Oral TID WC   lidocaine  1 patch Transdermal Q24H   loratadine  10 mg Oral Daily   methimazole  20 mg Oral BID   metoprolol succinate  25 mg Oral Daily   multivitamin with minerals  1 tablet Oral Daily   pantoprazole  40 mg Oral Daily   polyethylene glycol  17 g Oral BID   senna-docusate  2 tablet Oral Daily   sertraline  50 mg Oral QHS   sodium chloride flush  10-40 mL Intracatheter Q12H   sodium chloride flush  3 mL Intravenous Q12H   traMADol  50 mg Oral Q12H   traZODone  150 mg Oral QHS   warfarin  6 mg Oral ONCE-1600   Warfarin - Pharmacist Dosing Inpatient   Does not apply q1600   zinc sulfate  220 mg Oral BID    Infusions:  sodium chloride Stopped (05/18/22 1622)   DAPTOmycin (CUBICIN) 650 mg in sodium chloride 0.9 % IVPB 650 mg (05/28/22 1957)    PRN Medications: sodium chloride, acetaminophen, albuterol, ondansetron (ZOFRAN) IV, mouth rinse, sodium chloride flush, sodium chloride flush, sodium hypochlorite   Patient Profile  Mr Adamec is a 61 year old with h/o HFrEF, HMIII LVAD, CAD, S/P CABG 2020 with Maze/LAA clipping, DMII, COPD, HTN, PAF, and previous ETOH abuse.    Admitted after syncope/driveline infection.     Assessment/Plan:   1. ID: Driveline infection.  CT abdomen/pelvis with small fluid collection along pre-peritoneal driveline.  Early sepsis. Lactate initially elevated.  MAP improved with IV fluid, creatinine lower. . S/p driveline site debridement 6/8. Surgical wound cultures growing MRSA  Blood Cx NGTD.  - AF today. WBCs 15 today. (Got 3 days of prednisone 617-6/19).  - ID following.Vanc switched to Dapto with completion day July 13 th. OPAT orders placed. PICC placed - HH orders placed.  TOC CM following for home antibiotics.  - Prealbumin 15. Dietitian appreciated. Started on supplements. Appetite improved - Seen by Dr. Norina Buzzard 6/16 wound healing well. Continue wet-to-dry  dressings - No pain. Would not send home on narcotics with h/o meth use and selling narcotics.  . Discussed plan to eventually switch to tylenol.  2.  Acute on chronic systolic CHF: Echo 10/21 with EF 20-25%, mildly decreased RV function. LHC/RHC in 12/21 with patent grafts, low output. Suspect mixed ischemic/nonischemic cardiomyopathy (prior heavy ETOH and drugs as well as CAD).  No ETOH, drugs, smoking since CABG in 12/20. Admitted with cardiogenic shock  in 12/21, had placement of Impella 5.5 initially, now s/p Heartmate 3 LVAD on 11/17/20.  Ramp echo 2/23 with speed decreased to 5500 rpm.  NYHA class II - Appears euvolemic.  CVP 5-6 today.  -Had AKI and taken off hydralazine and entresto. Hydralazine restarted Maps > 100s. Continue hydralazine and increase to 75 mg three times a day.  - Continue 25 mg Toprol XL daily. (Home dose)  - INR 1.7 today. Goal 2-2.5. Discussed dosing with PharmD personally. - LDH  stable.  3. AKI  - Creatinine improved but now trending back up 0.9>1.6>3.2>3.7>4.28>4.63> 4.42 >4.17>3.59 >>3.62 - Initially thought this was ATN from low BP but he was never markedly hypotensive and LFTs without any evidence of shock liver. ? Vanc toxicity (troughs were ok). Taken off entresto and hydralazine. Volume repleted with IVF.  - UA ok. Renal US negative.  - Hydralazine restarted with hypertension.  - Not uremic.  4. CAD: S/p CABG 12/20.  LHC pre-VAD with patent grafts, no target for intervention. - No s/s angina - Continue statin.  5. Atrial fibrillation: Paroxysmal.  S/p Maze and LA appendage clip with CABG in 12/20. Maintaining NSR . Prior to admit he was on 25 mg Toprol XL - Continue Toprol XL 25 mg daily.  - Off amiodarone with hyperthyroidism. - Coumadin as above 6. Type 2 diabetes: SSI. Running higher. Consulted diabetes coordinator.  -Semglee added. - Continue SSI.  - Improved off prednisone.  7. 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 Wants rehab.  UDS negative 8. 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  He missed his endocrinology appt.  - He is now off amiodarone.   - Continue methimazole 20 mg bid, TSH was mildly elevated.   - Will need to reschedule endocrine appt.   9. Moderate Malnutrition -Prealbumin 15. -Dietitian consulted. Started on supplements. Diet liberalized.  - Appetite better when nausea resolved off vanc - Vit C , MVI, and Zinc started. 10. L wrist pain -> suspect acute gout - Uric acid normal  - Improved with prednisone 11. Hyperkalemia - Received Lokelma 6/18 and 6/19  K 4.9 today.  -12. COPD  - No wheeze today.  - Completed  3 days prednisone. Changed to scheduled nebs.   [name] for home antibiotics. [name] will supply antibiotics.   I reviewed the LVAD parameters from today, and compared the results to the patient's prior recorded data.  No programming changes were made.  The LVAD is functioning within specified parameters.  The patient performs LVAD self-test daily.  LVAD interrogation was negative for any significant power changes, alarms or PI events/speed drops.  LVAD equipment check completed and is in good working order.  Back-up equipment present.   LVAD education done on emergency procedures and precautions and reviewed exit site care.  Length of Stay: [address] PA-C 05/29/2022, 7:53 AM  VAD Team --- VAD ISSUES ONLY--- Pager [phone] (7am - 7am)  Advanced Heart Failure Team  Pager [phone] (M-F; 7a - 5p)  Please contact [facility] for night-coverage after hours (5p -7a ) and weekends on [email or web address]   Patient seen and examined with the above-signed Advanced Practice Provider and/or Housestaff. I personally reviewed laboratory data, imaging studies and relevant notes. I independently examined the patient and formulated the important aspects of the plan. I have edited the note to reflect any of my changes or salient points. I have personally discussed the plan with  the patient and/or family.  Was agitated and  having some CP earlier today but now feeling better. Has diuresed well. Denies SOB. Scr has bumped up slightly today  General:  NAD.  HEENT: normal  Neck: supple. JVP not elevated.  Carotids 2+ bilat; no bruits. No lymphadenopathy or thryomegaly appreciated. Cor: LVAD hum.  Lungs: Clear. Abdomen: obese soft, nontender, non-distended. No hepatosplenomegaly. No bruits or masses. Good bowel sounds. Driveline site clean. Anchor in place.  Extremities: no cyanosis, clubbing, rash. Warm no edema  Neuro: alert & oriented x 3. No focal deficits. Moves all 4 without problem   Had a rough day today with agitation and chest discomfort. Now resolved. ? Degree of meth withdrawal.   Unclear why Scr is going back up. Watch MAPs and fluid status. May need formal input from Nephrology (we have discussed with them several times already).   VAD interrogated personally. Parameters stable. INR 1.7 Discussed dosing with PharmD personally.  Arvilla Meres, MD  9:21 PM

## 2022-05-29 NOTE — Progress Notes (Signed)
Mobility Specialist Progress Note    05/29/22 1525  Mobility  Activity Ambulated independently in hallway  Level of Assistance Independent  Assistive Device None  Distance Ambulated (ft) 800 ft  Activity Response Tolerated well  $Mobility charge 1 Mobility   Pt received in chair and agreeable. No complaints on walk. Returned to chair with call bell in reach.    [place] Mobility Specialist

## 2022-05-29 NOTE — Progress Notes (Signed)
PHARMACY CONSULT NOTE FOR:  OUTPATIENT  PARENTERAL ANTIBIOTIC THERAPY (OPAT) - updated 6/21  Indication: MRSA LVAD Driveline Infection Regimen: Daptomycin 650 mg IV every 48 hours End date: 06/20/22  IV antibiotic discharge orders are pended. To discharging provider:  please sign these orders via discharge navigator,  Select New Orders & click on the button choice - Manage This Unsigned Work.     Thank you for allowing pharmacy to be a part of this patient's care.  Sheppard Coil PharmD., BCPS Clinical Pharmacist 05/29/2022 6:29 AM

## 2022-05-29 NOTE — Progress Notes (Signed)
Mobility Specialist Progress Note    05/29/22 1055  Mobility  Activity Contraindicated/medical hold   Pt stated he is not feeling good all over. Will f/u as schedule permits. RN notified.   [place] Mobility Specialist

## 2022-05-29 NOTE — Plan of Care (Signed)
  Problem: Education: Goal: Knowledge of General Education information will improve Description: Including pain rating scale, medication(s)/side effects and non-pharmacologic comfort measures Outcome: Progressing   Problem: Health Behavior/Discharge Planning: Goal: Ability to manage health-related needs will improve Outcome: Progressing   Problem: Clinical Measurements: Goal: Ability to maintain clinical measurements within normal limits will improve Outcome: Progressing Goal: Diagnostic test results will improve Outcome: Progressing   Problem: Nutrition: Goal: Adequate nutrition will be maintained Outcome: Progressing   Problem: Pain Managment: Goal: General experience of comfort will improve Outcome: Progressing   Problem: Skin Integrity: Goal: Risk for impaired skin integrity will decrease Outcome: Progressing

## 2022-05-29 NOTE — Discharge Summary (Incomplete)
Advanced Heart Failure Team  Discharge Summary   Patient ID: Kevin Underwood MRN: [id number], DOB/AGE: [birth year] 61 y.o. 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    Patient had echo in 10/21 with EF 20-25%.  He was admitted in 12/21 with cardiogenic shock.  Cath showed patent grafts and low cardiac output.  Suspect mixed ischemic/nonischemic cardiomyopathy, prior heavy ETOH though no ETOH or smoking since 12/20 CABG.  He required stabilization with Impella 5.5 and ultimately had placement of Heartmate 3 LVAD.  Paroxysmal atrial fibrillation noted post-op, but generally stable course and discharged home.     Patient was admitted in 10/22 with COPD exacerbation.  Kevin Underwood was stopped with frequent PIs.  He was treated with steroids, nebs, antibiotics with improvement.    Ramp echo was done in 2/23.  At 5600 rpm the LV was small and the septum with some left shift, aortic valve opening 3/5 beats.  At 5500 rpm, the LV was larger and septum more midline, aortic valve still opening 3/5 beats.  The RV was moderately enlarged with normal systolic function. I left speed at 5500 rpm.    Presented to [facility] ED 05/13/22 and was transferred to [facility] via [facility] for driveline infection and sepsis. He was started on abx with vanc and zosyn. Underwent surgical debridement on 06/08. Wound cultures grew MRSA. ID consulted. 4 weeks IV abx recommended with end-date 07/13. Abx narrowed to Vanc>>later switched to Daptomycin d/t nausea. PICC line placed and HH arranged for home IV antibiotics.    Course c/b AKI, acute on chronic CHF  requiring IV diuresis and acute gout flare treated with prednisone. Reported regular methamphetamine use during admission and was given resources for NA meetings.   Has f/u in VAD clinic 06/30 and ID 06/20/22.   Please see below for hospital course by Problem.  Hospital Course by Problem:    LVAD Interrogation HM II:   Speed:     Flow:      PI:      Power:       Back-up speed:       Discharge Weight Range: *** Discharge Vitals: Blood pressure 123/87, pulse 76, temperature 97.9 F (36.6 C), temperature source Oral, resp. rate 15, height 5' 10.5" (1.791 m), weight 86 kg, SpO2 97 %.  Labs: Lab Results  Component Value Date   WBC 15.7 (H) 05/29/2022   HGB 8.7 (L) 05/29/2022   HCT 26.7 (L) 05/29/2022   MCV 82.4 05/29/2022   PLT 354 05/29/2022    Recent Labs  Lab 05/25/22 0342 05/26/22 0333 05/29/22 0510  NA 132*   < > 133*  K 4.5   < > 4.9  CL 95*   < > 94*  CO2 24   < > 28  BUN 56*   < > 67*  CREATININE 4.63*   < > 3.62*  CALCIUM 8.6*   < > 8.5*  PROT 6.4*  --   --   BILITOT 0.3  --   --   ALKPHOS 90  --   --   ALT 49*  --   --  AST 35  --   --   GLUCOSE 76   < > 144*   < > = values in this interval not displayed.   Lab Results  Component Value Date   CHOL 142 09/11/2021   HDL 62 09/11/2021   LDLCALC 61 09/11/2021   TRIG 95 09/11/2021   BNP (last 3 results) Recent Labs    05/17/22 0153  BNP 222.1*    ProBNP (last 3 results) No results for input(s): "PROBNP" in the last 8760 hours.   Diagnostic Studies/Procedures   DG CHEST PORT 1 VIEW  Result Date: 05/29/2022 CLINICAL DATA:  Cough EXAM: PORTABLE CHEST 1 VIEW COMPARISON:  05/17/2022 FINDINGS: Left ventricular cyst device unchanged. Prior median sternotomy and clipping of left atrial appendage. Negative for heart failure. Lungs are clear without infiltrate or effusion Right arm PICC tip in the SVC. IMPRESSION: Right arm PICC tip in the SVC No acute cardiopulmonary abnormality Electronically Signed   By:  Franchot Gallo M.D.   On: 05/29/2022 14:03    Discharge Medications   Allergies as of 05/29/2022   No Known Allergies   Med Rec must be completed prior to using this [facility]***        Durable Medical Equipment  (From admission, onward)           Start     Ordered   05/24/22 1253  Heart failure home health orders  (Heart failure home health orders / Face to face)  Once       Comments: Heart Failure Follow-up Care:  Verify follow-up appointments per Patient Discharge Instructions. Confirm transportation arranged. Reconcile home medications with discharge medication list. Remove discontinued medications from use. Assist patient/caregiver to manage medications using pill box. Reinforce low sodium food selection Assessments: Vital signs and oxygen saturation at each visit. Assess home environment for safety concerns, caregiver support and availability of low-sodium foods. Consult Education officer, museum, PT/OT, Dietitian, and CNA based on assessments. Perform comprehensive cardiopulmonary assessment. Notify MD for any change in condition or weight gain of 3 pounds in one day or 5 pounds in one week with symptoms. Daily Weights and Symptom Monitoring: Ensure patient has access to scales. Teach patient/caregiver to weigh daily before breakfast and after voiding using same scale and record.    Teach patient/caregiver to track weight and symptoms and when to notify Provider. Activity: Develop individualized activity plan with patient/caregiver.   Question Answer Comment  Heart Failure Follow-up Care Advanced Heart Failure (AHF) Clinic at [phone]   Obtain the following labs Basic Metabolic Panel   Lab frequency Other see comments   Fax lab results to AHF Clinic at [phone]   Diet Low Sodium Heart Healthy   Fluid restrictions: 1500 mL Fluid      05/24/22 1254   05/20/22 0720  Heart failure home health orders  (Heart failure home health orders / Face to face)  Once        Comments: Heart Failure Follow-up Care:  Verify follow-up appointments per Patient Discharge Instructions. Confirm transportation arranged. Reconcile home medications with discharge medication list. Remove discontinued medications from use. Assist patient/caregiver to manage medications using pill box. Reinforce low sodium food selection Assessments: Vital signs and oxygen saturation at each visit. Assess home environment for safety concerns, caregiver support and availability of low-sodium foods. Consult Education officer, museum, PT/OT, Dietitian, and CNA based on assessments. Perform comprehensive cardiopulmonary assessment. Notify MD for any change in condition or weight gain of 3 pounds in one day or 5 pounds in  one week with symptoms. Daily Weights and Symptom Monitoring: Ensure patient has access to scales. Teach patient/caregiver to weigh daily before breakfast and after voiding using same scale and record.    Teach patient/caregiver to track weight and symptoms and when to notify Provider.   Activity: Develop individualized activity plan with patient/caregiver.  Per pharmacy protocol --vanco Aim for Vancomycin trough 15-20 or AUC 400-550 (unless otherwise indicated) Duration: 4 wk  End Date: July 7th   Kevin Underwood Per Protocol:   Home health RN for IV administration and teaching; PICC line care and labs.     Labs weekly while on IV antibiotics: _x_ CBC with differential _x_ BMP   _x_ CRP every other week _x_ ESR _x_ Vancomycin trough     _x_ Please pull PIC at completion of IV antibiotics __ Please leave PIC in place until doctor has seen patient or been notified   Fax weekly labs to [phone]  Question Answer Comment  Heart Failure Follow-up Care Advanced Heart Failure (AHF) Clinic at [phone]   Obtain the following labs Basic Metabolic Panel   Lab frequency Weekly   Fax lab results to AHF Clinic at [phone]   Diet Low Sodium Heart Healthy   Fluid  restrictions: See other comments      05/20/22 3870            Disposition   The patient will be discharged in stable condition to home.   Follow-up Information     [name] Follow up.   Why: Home Health RN-agency will call to arrange appts Contact information: [facility] Follow up.   Why: company will provided IV antibiotics Contact information: [phone]                  Duration of Discharge Encounter: Greater than 35 minutes   Signed, August Gosser N  05/29/2022, 4:02 PM

## 2022-05-29 NOTE — Progress Notes (Signed)
Patient reporting intermittent episodes of shaking chills since last night. Symptoms come and go. Feels sore all over.  Has been AF.  WBC 15-16K last few days.  Denies dysuria or other UTI symptoms. Notes occasional cough.  BC X 2 06/05 NG X 5 days. Wound cultures grew MRSA.  Repeat BC X 2. Check CXR.

## 2022-05-29 NOTE — Progress Notes (Incomplete)
Nutrition Follow-up  DOCUMENTATION CODES:   Non-severe (moderate) malnutrition in context of chronic illness  INTERVENTION:   - Continue Boost Plus po TID, each supplement provides 360 kcal and 14 grams of protein   - Continue with liberalized 2 gram sodium diet, can liberalize further to Regular if needed   - Encourage PO intake   - Continue vitamin C 500 mg daily and zinc sulfate 220 mg BID to aid in wound healing   - Continue MVI with minerals daily  NUTRITION DIAGNOSIS:   Moderate Malnutrition related to chronic illness (CHF, h/o LVAD, COPD) as evidenced by mild fat depletion, mild muscle depletion, severe muscle depletion.  Ongoing, being addressed via diet liberalization and oral nutrition supplements  GOAL:   Patient will meet greater than or equal to 90% of their needs  Progressing  MONITOR:   PO intake, Supplement acceptance, Diet advancement, Labs, Weight trends, I & O's  REASON FOR ASSESSMENT:   Consult Wound healing  ASSESSMENT:   Pt admitted with LVAD complication drive infection/syncope. PMH significant for  CHF, HMIII LVAD, CAD, s/p CABG 2020 with Maze/LAA clipping, T2DM, COPD, HTN, PAF and previous ETOH abuse.  06/08 - s/p surgical debridement of driveline infection  Admit weight: 78.5 kg Current weight: *** kg  Meal Completion: 75-100%  Medications reviewed and include: vitamin C 500 mg daily, colace, SSI, novolog 5 units TID with meals, semglee 18 units daily, Boost Plus TID, MVI with minerals, protonix, miralax, senna, warfarin, IV abx  Labs reviewed: ***  NUTRITION - FOCUSED PHYSICAL EXAM:  {RD Focused Exam List:21252}  Diet Order:   Diet Order             Diet 2 gram sodium Room service appropriate? Yes; Fluid consistency: Thin  Diet effective now                   EDUCATION NEEDS:   Education needs have been addressed  Skin:  Skin Assessment: Skin Integrity Issues: Skin Integrity Issues:: Incisions Incisions: L abdomen  (closed)  Last BM:  05/21/22 type 6  Height:   Ht Readings from Last 1 Encounters:  05/17/22 5' 10.5" (1.791 m)    Weight:   Wt Readings from Last 1 Encounters:  05/29/22 86 kg    BMI:  Body mass index is 26.82 kg/m.  Estimated Nutritional Needs:   Kcal:  2300-2500  Protein:  100-115g  Fluid:  >/=2.3L    Mertie Clause, MS, RD, LDN Inpatient Clinical Dietitian Please see AMiON for contact information.

## 2022-05-30 DIAGNOSIS — Z95811 Presence of heart assist device: Secondary | ICD-10-CM | POA: Diagnosis not present

## 2022-05-30 DIAGNOSIS — T829XXA Unspecified complication of cardiac and vascular prosthetic device, implant and graft, initial encounter: Secondary | ICD-10-CM | POA: Diagnosis not present

## 2022-05-30 DIAGNOSIS — N179 Acute kidney failure, unspecified: Secondary | ICD-10-CM | POA: Diagnosis not present

## 2022-05-30 DIAGNOSIS — T827XXA Infection and inflammatory reaction due to other cardiac and vascular devices, implants and grafts, initial encounter: Secondary | ICD-10-CM | POA: Diagnosis not present

## 2022-05-30 LAB — GLUCOSE, CAPILLARY
Glucose-Capillary: 235 mg/dL — ABNORMAL HIGH (ref 70–99)
Glucose-Capillary: 309 mg/dL — ABNORMAL HIGH (ref 70–99)
Glucose-Capillary: 122 mg/dL — ABNORMAL HIGH (ref 70–99)
Glucose-Capillary: 323 mg/dL — ABNORMAL HIGH (ref 70–99)

## 2022-05-30 LAB — BASIC METABOLIC PANEL
Anion gap: 11 (ref 5–15)
BUN: 63 mg/dL — ABNORMAL HIGH (ref 8–23)
CO2: 29 mmol/L (ref 22–32)
Calcium: 8.5 mg/dL — ABNORMAL LOW (ref 8.9–10.3)
Chloride: 94 mmol/L — ABNORMAL LOW (ref 98–111)
Creatinine, Ser: 3.54 mg/dL — ABNORMAL HIGH (ref 0.61–1.24)
GFR, Estimated: 19 mL/min — ABNORMAL LOW (ref >60)
Glucose, Bld: 239 mg/dL — ABNORMAL HIGH (ref 70–99)
Potassium: 5.2 mmol/L — ABNORMAL HIGH (ref 3.5–5.1)
Sodium: 134 mmol/L — ABNORMAL LOW (ref 135–145)

## 2022-05-30 LAB — URINALYSIS, ROUTINE W REFLEX MICROSCOPIC
Bilirubin Urine: NEGATIVE
Glucose, UA: NEGATIVE mg/dL
Hgb urine dipstick: NEGATIVE
Ketones, ur: NEGATIVE mg/dL
Leukocytes,Ua: NEGATIVE
Nitrite: NEGATIVE
Protein, ur: NEGATIVE mg/dL
Specific Gravity, Urine: 1.011 (ref 1.005–1.030)
pH: 6 (ref 5.0–8.0)

## 2022-05-30 LAB — TYPE AND SCREEN
ABO/RH(D): B POS
Antibody Screen: NEGATIVE

## 2022-05-30 LAB — CBC
HCT: 25.6 % — ABNORMAL LOW (ref 39.0–52.0)
Hemoglobin: 8.6 g/dL — ABNORMAL LOW (ref 13.0–17.0)
MCH: 27.5 pg (ref 26.0–34.0)
MCHC: 33.6 g/dL (ref 30.0–36.0)
MCV: 81.8 fL (ref 80.0–100.0)
Platelets: 346 10*3/uL (ref 150–400)
RBC: 3.13 MIL/uL — ABNORMAL LOW (ref 4.22–5.81)
RDW: 16.2 % — ABNORMAL HIGH (ref 11.5–15.5)
WBC: 15.3 10*3/uL — ABNORMAL HIGH (ref 4.0–10.5)
nRBC: 0 % (ref 0.0–0.2)

## 2022-05-30 LAB — PROTIME-INR
INR: 1.7 — ABNORMAL HIGH (ref 0.8–1.2)
Prothrombin Time: 19.4 seconds — ABNORMAL HIGH (ref 11.4–15.2)

## 2022-05-30 LAB — LACTATE DEHYDROGENASE: LDH: 147 U/L (ref 98–192)

## 2022-05-30 LAB — MAGNESIUM: Magnesium: 2.1 mg/dL (ref 1.7–2.4)

## 2022-05-30 MED ORDER — INSULIN GLARGINE-YFGN 100 UNIT/ML ~~LOC~~ SOLN: 24.0000 [IU] | Freq: Every day | SUBCUTANEOUS | Status: DC

## 2022-05-30 MED ORDER — WARFARIN SODIUM 4 MG PO TABS
8.0000 mg | ORAL_TABLET | Freq: Once | ORAL | Status: AC
  Administered 2022-05-30: 8 mg via ORAL
  Filled 2022-05-30: qty 2

## 2022-05-30 MED ORDER — INSULIN GLARGINE-YFGN 100 UNIT/ML ~~LOC~~ SOLN
24.0000 [IU] | Freq: Every day | SUBCUTANEOUS | Status: DC
  Administered 2022-05-30 – 2022-06-03 (×5): 24 [IU] via SUBCUTANEOUS
  Filled 2022-05-30 (×5): qty 0.24

## 2022-05-30 MED ORDER — ENSURE ENLIVE PO LIQD
237.0000 mL | Freq: Two times a day (BID) | ORAL | Status: DC
  Administered 2022-05-30 – 2022-06-03 (×7): 237 mL via ORAL

## 2022-05-30 MED ORDER — CEFAZOLIN SODIUM-DEXTROSE 2-4 GM/100ML-% IV SOLN
2.0000 g | INTRAVENOUS | Status: AC
  Administered 2022-05-31: 2 g via INTRAVENOUS
  Filled 2022-05-30: qty 100

## 2022-05-30 MED ORDER — INSULIN GLARGINE-YFGN 100 UNIT/ML ~~LOC~~ SOLN
22.0000 [IU] | Freq: Every day | SUBCUTANEOUS | Status: DC
Start: 2022-05-30 — End: 2022-05-30
  Filled 2022-05-30: qty 0.22

## 2022-05-30 NOTE — Progress Notes (Signed)
ANTICOAGULATION CONSULT NOTE   Pharmacy Consult for Warfarin Indication:  LVAD  No Known Allergies  Patient Measurements: Height: 5' 10.5" (179.1 cm) Weight: 84.9 kg (187 lb 2.7 oz) IBW/kg (Calculated) : 74.15   Vital Signs: Temp: 98.2 F (36.8 C) (06/22 0357) Temp Source: Oral (06/22 0357) BP: 118/83 (06/22 0357) Pulse Rate: 75 (06/22 0357)  Labs: Recent Labs    05/28/22 0558 05/29/22 0510 05/30/22 0410  HGB 9.6* 8.7* 8.6*  HCT 28.8* 26.7* 25.6*  PLT 377 354 346  LABPROT 18.8* 19.6* 19.4*  INR 1.6* 1.7* 1.7*  CREATININE 3.59* 3.62* 3.54*  CKTOTAL  --  46*  --      Estimated Creatinine Clearance: 23 mL/min (A) (by C-G formula based on SCr of 3.54 mg/dL (H)).   Medical History: Past Medical History:  Diagnosis Date   "    Arthritis    CAD (coronary artery disease)    a. s/p CABG in 11/2019 with LIMA-LAD, SVG-OM1, SVG-PDA and SVG-D1   CHF (congestive heart failure) (HCC)    a. EF < 20% by echo in 11/2019   Essential hypertension    PAF (paroxysmal atrial fibrillation) (HCC)    Type 2 diabetes mellitus (HCC)     Assessment: 61yom with LVAD HM3 implanted 2021, admitted for drive line infection. INR 2.6 on admit.  S/p debridement and surgery ok to resume warfarin 6/8 with no heparin.   INR unchanged at 1.7 this AM. No bleeding issues noted. Hemoglobin down this morning to 8.6, will watch. Pltc ok. LDH down 140s this morning.   Of note patient on boost nutritional supplements to help promote wound healing. It is questionable if these will continue after discharge.    PTA warfarin 4mg  TTSS / 6mg  MWF  Goal of Therapy:  INR 2-2.5 Monitor platelets by anticoagulation protocol: Yes   Plan:  Warfarin 8 mg today Daily INR  PharmD., BCPS Clinical Pharmacist 05/30/2022 8:40 AM

## 2022-05-30 NOTE — Progress Notes (Signed)
14 Days Post-Op Procedure(s) (LRB): VAD DRIVELINE DEBRIDEMENT (N/A) Subjective: Patient's driveline wound has shown contraction over this week but has persistent fibrinous exudate over the entire surface.  Repeat culture has been negative.  White count persists 15,000 and he has had some tenderness over the more proximal driveline tunnel.  The patient would benefit from surgical debridement and irrigation in the operating room under MAC anesthesia  which will be scheduled for tomorrow afternoon.  Objective: Vital signs in last 24 hours: Temp:  [97.7 F (36.5 C)-98.8 F (37.1 C)] 98.7 F (37.1 C) (06/22 1608) Pulse Rate:  [72-85] 85 (06/22 1608) Cardiac Rhythm: Normal sinus rhythm (06/22 1600) Resp:  [12-24] 12 (06/22 1608) BP: (113-139)/(78-118) 117/85 (06/22 1608) SpO2:  [92 %-98 %] 95 % (06/22 1608) Weight:  [84.9 kg] 84.9 kg (06/22 0357)  Hemodynamic parameters for last 24 hours: CVP:  [4 mmHg-8 mmHg] 7 mmHg  Intake/Output from previous day: 06/21 0701 - 06/22 0700 In: 750 [P.O.:750] Out: 2500 [Urine:2500] Intake/Output this shift: Total I/O In: 437 [P.O.:437] Out: -   Exam Alert, neuro intact Wound packed with wet-to-dry 4 x 4 gauze. At the time of wound packing the gauze has fibrinous yellow exudate Normal VAD hum  Lab Results: Recent Labs    05/29/22 0510 05/30/22 0410  WBC 15.7* 15.3*  HGB 8.7* 8.6*  HCT 26.7* 25.6*  PLT 354 346   BMET:  Recent Labs    05/29/22 0510 05/30/22 0410  NA 133* 134*  K 4.9 5.2*  CL 94* 94*  CO2 28 29  GLUCOSE 144* 239*  BUN 67* 63*  CREATININE 3.62* 3.54*  CALCIUM 8.5* 8.5*    PT/INR:  Recent Labs    05/30/22 0410  LABPROT 19.4*  INR 1.7*   ABG    Component Value Date/Time   PHART 7.508 (H) 11/20/2020 0438   HCO3 24.8 09/09/2021 0313   TCO2 29 11/20/2020 0438   ACIDBASEDEF 6.0 (H) 11/24/2019 1718   O2SAT 63.4 09/09/2021 0313   CBG (last 3)  Recent Labs    05/30/22 0628 05/30/22 1135 05/30/22 1611   GLUCAP 235* 309* 122*    Assessment/Plan: S/P Procedure(s) (LRB): VAD DRIVELINE DEBRIDEMENT (N/A) Plan operative debridement and irrigation of VAD driveline wound tomorrow in the OR early afternoon.   LOS: 17 days    [name] 05/30/2022

## 2022-05-30 NOTE — Progress Notes (Signed)
Inpatient Diabetes Program Recommendations  AACE/ADA: New Consensus Statement on Inpatient Glycemic Control (2015)  Target Ranges:  Prepandial:   less than 140 mg/dL      Peak postprandial:   less than 180 mg/dL (1-2 hours)      Critically ill patients:  140 - 180 mg/dL   Lab Results  Component Value Date   GLUCAP 309 (H) 05/30/2022   HGBA1C 8.2 (H) 05/13/2022    Review of Glycemic Control  Latest Reference Range & Units 05/29/22 11:07 05/29/22 16:17 05/29/22 21:24 05/30/22 06:28 05/30/22 11:35  Glucose-Capillary 70 - 99 mg/dL 962 (H) 229 (H) 798 (H) 235 (H) 309 (H)   Diabetes history: DM 2 Outpatient Diabetes medications:  Metformin 1000 mg bid Current orders for Inpatient glycemic control:  Novolog moderate tid with meals and HS Novolog 5 units tid with meals Semglee 24 units daily  Inpatient Diabetes Program Recommendations:    Note insulin needs increased.  Agree with increase in insulins.  ? Whether patient will benefit from the addition of insulin at discharge as well?  Note that A1C is slightly improved.  Will follow.   Thanks,  Beryl Meager, RN, BC-ADM Inpatient Diabetes Coordinator Pager [phone]  (8a-5p)

## 2022-05-30 NOTE — Plan of Care (Signed)
  Problem: Education: Goal: Knowledge of General Education information will improve Description: Including pain rating scale, medication(s)/side effects and non-pharmacologic comfort measures Outcome: Progressing   Problem: Clinical Measurements: Goal: Ability to maintain clinical measurements within normal limits will improve Outcome: Progressing Goal: Will remain free from infection Outcome: Progressing Goal: Diagnostic test results will improve Outcome: Progressing Goal: Respiratory complications will improve Outcome: Progressing Goal: Cardiovascular complication will be avoided Outcome: Progressing   Problem: Activity: Goal: Risk for activity intolerance will decrease Outcome: Progressing   Problem: Nutrition: Goal: Adequate nutrition will be maintained Outcome: Progressing   Problem: Coping: Goal: Level of anxiety will decrease Outcome: Progressing   Problem: Elimination: Goal: Will not experience complications related to bowel motility Outcome: Progressing Goal: Will not experience complications related to urinary retention Outcome: Progressing   Problem: Pain Managment: Goal: General experience of comfort will improve Outcome: Progressing   Problem: Safety: Goal: Ability to remain free from injury will improve Outcome: Progressing   

## 2022-05-30 NOTE — Progress Notes (Signed)
Mobility Specialist Progress Note    05/30/22 1716  Mobility  Activity Ambulated independently in hallway  Level of Assistance Independent  Assistive Device None  Distance Ambulated (ft) 420 ft  Activity Response Tolerated well  $Mobility charge 1 Mobility   Pt received in chair and agreeable. No complaints on walk. Returned to chair with call bell in reach.    Kevin Underwood Mobility Specialist

## 2022-05-31 ENCOUNTER — Inpatient Hospital Stay (HOSPITAL_COMMUNITY): Payer: 59 | Admitting: Anesthesiology

## 2022-05-31 ENCOUNTER — Encounter (HOSPITAL_COMMUNITY): Payer: Self-pay | Admitting: Cardiology

## 2022-05-31 ENCOUNTER — Encounter (HOSPITAL_COMMUNITY)
Admission: RE | Disposition: A | Discharge status: Home Health | Payer: Self-pay | Source: Other Acute Inpatient Hospital | Attending: Cardiology

## 2022-05-31 DIAGNOSIS — I2510 Atherosclerotic heart disease of native coronary artery without angina pectoris: Secondary | ICD-10-CM | POA: Diagnosis not present

## 2022-05-31 DIAGNOSIS — Z95811 Presence of heart assist device: Secondary | ICD-10-CM | POA: Diagnosis not present

## 2022-05-31 DIAGNOSIS — I509 Heart failure, unspecified: Secondary | ICD-10-CM | POA: Diagnosis not present

## 2022-05-31 DIAGNOSIS — I110 Hypertensive heart disease with heart failure: Secondary | ICD-10-CM | POA: Diagnosis not present

## 2022-05-31 DIAGNOSIS — N179 Acute kidney failure, unspecified: Secondary | ICD-10-CM | POA: Diagnosis not present

## 2022-05-31 DIAGNOSIS — I9789 Other postprocedural complications and disorders of the circulatory system, not elsewhere classified: Secondary | ICD-10-CM

## 2022-05-31 DIAGNOSIS — T827XXA Infection and inflammatory reaction due to other cardiac and vascular devices, implants and grafts, initial encounter: Secondary | ICD-10-CM | POA: Diagnosis not present

## 2022-05-31 DIAGNOSIS — T829XXA Unspecified complication of cardiac and vascular prosthetic device, implant and graft, initial encounter: Secondary | ICD-10-CM | POA: Diagnosis not present

## 2022-05-31 HISTORY — PX: WOUND EXPLORATION: SHX6188

## 2022-05-31 LAB — CBC
HCT: 26.6 % — ABNORMAL LOW (ref 39.0–52.0)
Hemoglobin: 8.6 g/dL — ABNORMAL LOW (ref 13.0–17.0)
MCH: 26.9 pg (ref 26.0–34.0)
MCHC: 32.3 g/dL (ref 30.0–36.0)
MCV: 83.1 fL (ref 80.0–100.0)
Platelets: 347 10*3/uL (ref 150–400)
RBC: 3.2 MIL/uL — ABNORMAL LOW (ref 4.22–5.81)
RDW: 16 % — ABNORMAL HIGH (ref 11.5–15.5)
WBC: 15.2 10*3/uL — ABNORMAL HIGH (ref 4.0–10.5)
nRBC: 0 % (ref 0.0–0.2)

## 2022-05-31 LAB — BASIC METABOLIC PANEL
Anion gap: 11 (ref 5–15)
BUN: 58 mg/dL — ABNORMAL HIGH (ref 8–23)
CO2: 28 mmol/L (ref 22–32)
Calcium: 8.4 mg/dL — ABNORMAL LOW (ref 8.9–10.3)
Chloride: 94 mmol/L — ABNORMAL LOW (ref 98–111)
Creatinine, Ser: 3.21 mg/dL — ABNORMAL HIGH (ref 0.61–1.24)
GFR, Estimated: 21 mL/min — ABNORMAL LOW (ref >60)
Glucose, Bld: 221 mg/dL — ABNORMAL HIGH (ref 70–99)
Potassium: 4.6 mmol/L (ref 3.5–5.1)
Sodium: 133 mmol/L — ABNORMAL LOW (ref 135–145)

## 2022-05-31 LAB — PROTIME-INR
INR: 1.9 — ABNORMAL HIGH (ref 0.8–1.2)
Prothrombin Time: 21.7 seconds — ABNORMAL HIGH (ref 11.4–15.2)

## 2022-05-31 LAB — LACTATE DEHYDROGENASE: LDH: 173 U/L (ref 98–192)

## 2022-05-31 LAB — GLUCOSE, CAPILLARY
Glucose-Capillary: 229 mg/dL — ABNORMAL HIGH (ref 70–99)
Glucose-Capillary: 128 mg/dL — ABNORMAL HIGH (ref 70–99)
Glucose-Capillary: 124 mg/dL — ABNORMAL HIGH (ref 70–99)
Glucose-Capillary: 87 mg/dL (ref 70–99)
Glucose-Capillary: 96 mg/dL (ref 70–99)
Glucose-Capillary: 182 mg/dL — ABNORMAL HIGH (ref 70–99)

## 2022-05-31 LAB — MAGNESIUM: Magnesium: 2 mg/dL (ref 1.7–2.4)

## 2022-05-31 SURGERY — WOUND EXPLORATION
Anesthesia: Monitor Anesthesia Care | Site: Abdomen

## 2022-05-31 MED ORDER — CHLORHEXIDINE GLUCONATE 0.12 % MT SOLN
OROMUCOSAL | Status: AC
  Administered 2022-05-31: 15 mL via OROMUCOSAL
  Filled 2022-05-31: qty 15

## 2022-05-31 MED ORDER — LIDOCAINE 2% (20 MG/ML) 5 ML SYRINGE
INTRAMUSCULAR | Status: DC | PRN
  Administered 2022-05-31: 30 mg via INTRAVENOUS

## 2022-05-31 MED ORDER — FENTANYL CITRATE (PF) 250 MCG/5ML IJ SOLN
INTRAMUSCULAR | Status: DC | PRN
  Administered 2022-05-31: 50 ug via INTRAVENOUS

## 2022-05-31 MED ORDER — CHLORHEXIDINE GLUCONATE 0.12 % MT SOLN
15.0000 mL | Freq: Once | OROMUCOSAL | Status: AC
  Filled 2022-05-31: qty 15

## 2022-05-31 MED ORDER — PROPOFOL 10 MG/ML IV BOLUS
INTRAVENOUS | Status: AC
  Filled 2022-05-31: qty 20

## 2022-05-31 MED ORDER — FENTANYL CITRATE (PF) 250 MCG/5ML IJ SOLN
INTRAMUSCULAR | Status: AC
  Filled 2022-05-31: qty 5

## 2022-05-31 MED ORDER — PROPOFOL 10 MG/ML IV BOLUS
INTRAVENOUS | Status: DC | PRN
  Administered 2022-05-31: 80 mg via INTRAVENOUS

## 2022-05-31 MED ORDER — PHENYLEPHRINE 80 MCG/ML (10ML) SYRINGE FOR IV PUSH (FOR BLOOD PRESSURE SUPPORT)
PREFILLED_SYRINGE | INTRAVENOUS | Status: DC | PRN
  Administered 2022-05-31: 80 ug via INTRAVENOUS

## 2022-05-31 MED ORDER — SODIUM CHLORIDE 0.9 % IV SOLN: INTRAVENOUS | Status: DC

## 2022-05-31 MED ORDER — ACETAMINOPHEN 10 MG/ML IV SOLN: 1000.0000 mg | Freq: Once | INTRAVENOUS | Status: DC | PRN

## 2022-05-31 MED ORDER — WARFARIN SODIUM 4 MG PO TABS
8.0000 mg | ORAL_TABLET | Freq: Once | ORAL | Status: AC
Start: 2022-05-31 — End: 2022-05-31
  Administered 2022-05-31: 8 mg via ORAL
  Filled 2022-05-31 (×2): qty 2

## 2022-05-31 MED ORDER — VANCOMYCIN HCL 1000 MG IV SOLR
INTRAVENOUS | Status: AC
  Filled 2022-05-31: qty 20

## 2022-05-31 MED ORDER — PROPOFOL 10 MG/ML IV BOLUS
INTRAVENOUS | Status: AC
Start: 2022-05-31 — End: ?
  Filled 2022-05-31: qty 20

## 2022-05-31 MED ORDER — VANCOMYCIN HCL 1000 MG IV SOLR: INTRAVENOUS | Status: DC | PRN

## 2022-05-31 MED ORDER — ONDANSETRON HCL 4 MG/2ML IJ SOLN: 4.0000 mg | Freq: Once | INTRAMUSCULAR | Status: DC | PRN

## 2022-05-31 MED ORDER — FENTANYL CITRATE (PF) 100 MCG/2ML IJ SOLN: 25.0000 ug | INTRAMUSCULAR | Status: DC | PRN

## 2022-05-31 SURGICAL SUPPLY — 37 items
BLADE CLIPPER SURG (BLADE) ×1 IMPLANT
BLADE SURG 10 STRL SS (BLADE) ×1 IMPLANT
CANISTER SUCT 3000ML PPV (MISCELLANEOUS) ×2 IMPLANT
CONTAINER PROTECT SURGISLUSH (MISCELLANEOUS) ×2 IMPLANT
COVER SURGICAL LIGHT HANDLE (MISCELLANEOUS) ×3 IMPLANT
DRAPE HALF SHEET 40X57 (DRAPES) ×1 IMPLANT
DRSG AQUACEL AG ADV 3.5X14 (GAUZE/BANDAGES/DRESSINGS) ×1 IMPLANT
DRSG PAD ABDOMINAL 8X10 ST (GAUZE/BANDAGES/DRESSINGS) ×1 IMPLANT
ELECT BLADE 4.0 EZ CLEAN MEGAD (MISCELLANEOUS) ×2
ELECT REM PT RETURN 9FT ADLT (ELECTROSURGICAL) ×2
ELECTRODE BLDE 4.0 EZ CLN MEGD (MISCELLANEOUS) IMPLANT
ELECTRODE REM PT RTRN 9FT ADLT (ELECTROSURGICAL) ×1 IMPLANT
GAUZE 4X4 16PLY ~~LOC~~+RFID DBL (SPONGE) ×1 IMPLANT
GAUZE SPONGE 4X4 12PLY STRL (GAUZE/BANDAGES/DRESSINGS) ×3 IMPLANT
GAUZE SPONGE 4X4 12PLY STRL LF (GAUZE/BANDAGES/DRESSINGS) ×1 IMPLANT
GLOVE BIOGEL PI IND STRL 6.5 (GLOVE) IMPLANT
GLOVE BIOGEL PI INDICATOR 6.5 (GLOVE) ×1
GLOVE SS BIOGEL STRL SZ 6 (GLOVE) IMPLANT
GLOVE SUPERSENSE BIOGEL SZ 6 (GLOVE) ×1
GLOVE SURG SIGNA 7.5 PF LTX (GLOVE) ×2 IMPLANT
GOWN STRL REUS W/ TWL LRG LVL3 (GOWN DISPOSABLE) ×2 IMPLANT
GOWN STRL REUS W/TWL LRG LVL3 (GOWN DISPOSABLE) ×4
KIT BASIN OR (CUSTOM PROCEDURE TRAY) ×2 IMPLANT
KIT TURNOVER KIT B (KITS) ×2 IMPLANT
NS IRRIG 1000ML POUR BTL (IV SOLUTION) ×2 IMPLANT
PACK GENERAL/GYN (CUSTOM PROCEDURE TRAY) ×2 IMPLANT
PAD ARMBOARD 7.5X6 YLW CONV (MISCELLANEOUS) ×4 IMPLANT
PULSAVAC PLUS IRRIG FAN TIP (DISPOSABLE) ×2
SPONGE T-LAP 18X18 ~~LOC~~+RFID (SPONGE) ×5 IMPLANT
SPONGE T-LAP 4X18 ~~LOC~~+RFID (SPONGE) ×1 IMPLANT
SWAB COLLECTION DEVICE MRSA (MISCELLANEOUS) ×2 IMPLANT
SWAB CULTURE ESWAB REG 1ML (MISCELLANEOUS) ×2 IMPLANT
TAPE CLOTH SURG 4X10 WHT LF (GAUZE/BANDAGES/DRESSINGS) ×1 IMPLANT
TIP FAN IRRIG PULSAVAC PLUS (DISPOSABLE) IMPLANT
TOWEL GREEN STERILE (TOWEL DISPOSABLE) ×2 IMPLANT
TOWEL GREEN STERILE FF (TOWEL DISPOSABLE) ×2 IMPLANT
WATER STERILE IRR 1000ML POUR (IV SOLUTION) ×2 IMPLANT

## 2022-05-31 NOTE — Anesthesia Preprocedure Evaluation (Addendum)
Anesthesia Evaluation  Patient identified by MRN, date of birth, ID band Patient awake    Reviewed: Allergy & Precautions, NPO status , Patient's Chart, lab work & pertinent test results  Airway Mallampati: II  TM Distance: >3 FB Neck ROM: Full    Dental  (+) Edentulous Lower, Edentulous Upper   Pulmonary sleep apnea , COPD,  COPD inhaler, former smoker,    Pulmonary exam normal        Cardiovascular hypertension, Pt. on home beta blockers + CAD, + CABG and +CHF  Normal cardiovascular exam+ dysrhythmias Atrial Fibrillation      Neuro/Psych PSYCHIATRIC DISORDERS negative neurological ROS     GI/Hepatic negative GI ROS, Neg liver ROS,   Endo/Other  diabetes, Oral Hypoglycemic Agents  Renal/GU Renal disease     Musculoskeletal  (+) Arthritis , Gout   Abdominal   Peds  Hematology  (+) Blood dyscrasia, anemia ,   Anesthesia Other Findings LVAD Drive line infection  Reproductive/Obstetrics                             Anesthesia Physical Anesthesia Plan  ASA: 4  Anesthesia Plan: General   Post-op Pain Management:    Induction: Intravenous  PONV Risk Score and Plan: 2 and Ondansetron, Dexamethasone, Treatment may vary due to age or medical condition and Midazolam  Airway Management Planned: LMA  Additional Equipment:   Intra-op Plan:   Post-operative Plan: Extubation in OR  Informed Consent: I have reviewed the patients History and Physical, chart, labs and discussed the procedure including the risks, benefits and alternatives for the proposed anesthesia with the patient or authorized representative who has indicated his/her understanding and acceptance.       Plan Discussed with: CRNA and Anesthesiologist  Anesthesia Plan Comments:       Anesthesia Quick Evaluation

## 2022-05-31 NOTE — Progress Notes (Signed)
Pt increasingly aggitated.  States he wants pain meds before they do his dressing, talking about being trapped here.  States that he wants something to eat and drink.  Re-emphasized reason for NPO.  Discussed that pt would be in OR for dressing and he would be under anesthesia.  Pt up and down in room and throwing blankets.  Attempted de-esculation with pt.  Pt appears calmer for the time being.  Will continue to monitor.

## 2022-05-31 NOTE — Op Note (Signed)
NAME: Kevin Underwood, Kevin Underwood MEDICAL RECORD NO: [id number] ACCOUNT NO: [id number] DATE OF BIRTH: April 06, 1961 FACILITY: [facility] LOCATION: [facility] PHYSICIAN: Kerin Perna III, MD  Operative Report   DATE OF PROCEDURE: 05/31/2022  OPERATION:  Excisional debridement of abdominal wound at the exit of the HeartMate 3 VAD power cord.  SURGEON:  Kathlee Nations Trigt III, MD  PREOPERATIVE DIAGNOSIS:  Wound infection at the HeartMate 3 VAD power cord exit site in the left upper quadrant.  POSTOPERATIVE DIAGNOSIS:  Wound infection at the HeartMate 3 VAD power cord exit site in the left upper quadrant.  ANESTHESIA:  General.  DESCRIPTION OF PROCEDURE:  The patient was brought from preoperative holding where informed consent was documented and final issues addressed with the patient.  The patient was brought directly to the operating room and placed supine on the operating  table.  General anesthesia was induced and the patient remained stable.  During the entire procedure, the VAD coordinator was present in the operating room and in the recovery room to monitor the VAD equipment and to help with assessment and treatment of  hemodynamics.  The previous abdominal packing was removed and the abdomen was prepped and draped as a sterile field.  A proper timeout was performed.  The wound was inspected.  There was a filmy layer of fibrinous exudate over the entire wound since the last exam.  This was removed with the edge of the scalpel to scrape off the filmy layer of exudate.  There was no gross purulence. Some more of the  Dacron coating around the power cord was removed, which appeared to be unhealthy.  The wound was then irrigated with a liter of vancomycin irrigation and pulse lavage.  The wound looked much, much better.  It was again packed with a 4 x 4 gauze,  wet-to-dry dressings and a dry stack of gauze and tape was applied.  The patient was reversed from anesthesia and returned to recovery room in stable  condition.   VAI D: 05/31/2022 6:14:12 pm T: 05/31/2022 11:18:00 pm  JOB: [id number]/ [id number]

## 2022-06-01 DIAGNOSIS — T829XXA Unspecified complication of cardiac and vascular prosthetic device, implant and graft, initial encounter: Secondary | ICD-10-CM | POA: Diagnosis not present

## 2022-06-01 DIAGNOSIS — N179 Acute kidney failure, unspecified: Secondary | ICD-10-CM | POA: Diagnosis not present

## 2022-06-01 DIAGNOSIS — Z95811 Presence of heart assist device: Secondary | ICD-10-CM | POA: Diagnosis not present

## 2022-06-01 DIAGNOSIS — T827XXA Infection and inflammatory reaction due to other cardiac and vascular devices, implants and grafts, initial encounter: Secondary | ICD-10-CM | POA: Diagnosis not present

## 2022-06-01 LAB — CBC
HCT: 25.1 % — ABNORMAL LOW (ref 39.0–52.0)
Hemoglobin: 8 g/dL — ABNORMAL LOW (ref 13.0–17.0)
MCH: 26.6 pg (ref 26.0–34.0)
MCHC: 31.9 g/dL (ref 30.0–36.0)
MCV: 83.4 fL (ref 80.0–100.0)
Platelets: 345 10*3/uL (ref 150–400)
RBC: 3.01 MIL/uL — ABNORMAL LOW (ref 4.22–5.81)
RDW: 16 % — ABNORMAL HIGH (ref 11.5–15.5)
WBC: 12.4 10*3/uL — ABNORMAL HIGH (ref 4.0–10.5)
nRBC: 0.2 % (ref 0.0–0.2)

## 2022-06-01 LAB — BASIC METABOLIC PANEL
Anion gap: 9 (ref 5–15)
BUN: 46 mg/dL — ABNORMAL HIGH (ref 8–23)
CO2: 28 mmol/L (ref 22–32)
Calcium: 8 mg/dL — ABNORMAL LOW (ref 8.9–10.3)
Chloride: 98 mmol/L (ref 98–111)
Creatinine, Ser: 2.88 mg/dL — ABNORMAL HIGH (ref 0.61–1.24)
GFR, Estimated: 24 mL/min — ABNORMAL LOW (ref >60)
Glucose, Bld: 213 mg/dL — ABNORMAL HIGH (ref 70–99)
Potassium: 4.6 mmol/L (ref 3.5–5.1)
Sodium: 135 mmol/L (ref 135–145)

## 2022-06-01 LAB — GLUCOSE, CAPILLARY
Glucose-Capillary: 194 mg/dL — ABNORMAL HIGH (ref 70–99)
Glucose-Capillary: 161 mg/dL — ABNORMAL HIGH (ref 70–99)
Glucose-Capillary: 131 mg/dL — ABNORMAL HIGH (ref 70–99)
Glucose-Capillary: 105 mg/dL — ABNORMAL HIGH (ref 70–99)

## 2022-06-01 LAB — PROTIME-INR
INR: 2.2 — ABNORMAL HIGH (ref 0.8–1.2)
Prothrombin Time: 23.8 seconds — ABNORMAL HIGH (ref 11.4–15.2)

## 2022-06-01 LAB — LACTATE DEHYDROGENASE: LDH: 151 U/L (ref 98–192)

## 2022-06-01 LAB — MAGNESIUM: Magnesium: 1.8 mg/dL (ref 1.7–2.4)

## 2022-06-01 MED ORDER — WARFARIN SODIUM 4 MG PO TABS
4.0000 mg | ORAL_TABLET | Freq: Once | ORAL | Status: AC
  Administered 2022-06-01: 4 mg via ORAL
  Filled 2022-06-01: qty 1

## 2022-06-01 NOTE — Progress Notes (Signed)
Wet to dry dressing changed to driveline site using sterile technique as ordered.  Wound pink and granulating.  No exudate or drainage noted.  Pt tolerated well, and denies pain.

## 2022-06-02 ENCOUNTER — Encounter (HOSPITAL_COMMUNITY): Payer: Self-pay | Admitting: Cardiothoracic Surgery

## 2022-06-02 DIAGNOSIS — T827XXA Infection and inflammatory reaction due to other cardiac and vascular devices, implants and grafts, initial encounter: Secondary | ICD-10-CM | POA: Diagnosis not present

## 2022-06-02 DIAGNOSIS — I5022 Chronic systolic (congestive) heart failure: Secondary | ICD-10-CM | POA: Diagnosis not present

## 2022-06-02 DIAGNOSIS — Z95811 Presence of heart assist device: Secondary | ICD-10-CM | POA: Diagnosis not present

## 2022-06-02 DIAGNOSIS — N179 Acute kidney failure, unspecified: Secondary | ICD-10-CM | POA: Diagnosis not present

## 2022-06-02 LAB — BASIC METABOLIC PANEL
Anion gap: 9 (ref 5–15)
BUN: 43 mg/dL — ABNORMAL HIGH (ref 8–23)
CO2: 27 mmol/L (ref 22–32)
Calcium: 8 mg/dL — ABNORMAL LOW (ref 8.9–10.3)
Chloride: 101 mmol/L (ref 98–111)
Creatinine, Ser: 2.87 mg/dL — ABNORMAL HIGH (ref 0.61–1.24)
GFR, Estimated: 24 mL/min — ABNORMAL LOW (ref >60)
Glucose, Bld: 122 mg/dL — ABNORMAL HIGH (ref 70–99)
Potassium: 4.3 mmol/L (ref 3.5–5.1)
Sodium: 137 mmol/L (ref 135–145)

## 2022-06-02 LAB — GLUCOSE, CAPILLARY
Glucose-Capillary: 178 mg/dL — ABNORMAL HIGH (ref 70–99)
Glucose-Capillary: 146 mg/dL — ABNORMAL HIGH (ref 70–99)
Glucose-Capillary: 73 mg/dL (ref 70–99)
Glucose-Capillary: 183 mg/dL — ABNORMAL HIGH (ref 70–99)

## 2022-06-02 LAB — CBC
HCT: 24.6 % — ABNORMAL LOW (ref 39.0–52.0)
Hemoglobin: 7.9 g/dL — ABNORMAL LOW (ref 13.0–17.0)
MCH: 26.7 pg (ref 26.0–34.0)
MCHC: 32.1 g/dL (ref 30.0–36.0)
MCV: 83.1 fL (ref 80.0–100.0)
Platelets: 332 10*3/uL (ref 150–400)
RBC: 2.96 MIL/uL — ABNORMAL LOW (ref 4.22–5.81)
RDW: 16.1 % — ABNORMAL HIGH (ref 11.5–15.5)
WBC: 11.3 10*3/uL — ABNORMAL HIGH (ref 4.0–10.5)
nRBC: 0 % (ref 0.0–0.2)

## 2022-06-02 LAB — PROTIME-INR
INR: 2.4 — ABNORMAL HIGH (ref 0.8–1.2)
Prothrombin Time: 25.6 seconds — ABNORMAL HIGH (ref 11.4–15.2)

## 2022-06-02 LAB — LACTATE DEHYDROGENASE: LDH: 151 U/L (ref 98–192)

## 2022-06-02 LAB — MAGNESIUM: Magnesium: 1.8 mg/dL (ref 1.7–2.4)

## 2022-06-02 MED ORDER — ALBUTEROL SULFATE (2.5 MG/3ML) 0.083% IN NEBU
2.5000 mg | INHALATION_SOLUTION | Freq: Two times a day (BID) | RESPIRATORY_TRACT | Status: DC
  Administered 2022-06-03: 2.5 mg via RESPIRATORY_TRACT
  Filled 2022-06-02: qty 3

## 2022-06-02 MED ORDER — WARFARIN SODIUM 4 MG PO TABS
4.0000 mg | ORAL_TABLET | Freq: Once | ORAL | Status: AC
Start: 2022-06-02 — End: 2022-06-02
  Administered 2022-06-02: 4 mg via ORAL
  Filled 2022-06-02: qty 1

## 2022-06-03 ENCOUNTER — Other Ambulatory Visit (HOSPITAL_COMMUNITY): Payer: Self-pay

## 2022-06-03 DIAGNOSIS — N179 Acute kidney failure, unspecified: Secondary | ICD-10-CM | POA: Diagnosis not present

## 2022-06-03 DIAGNOSIS — Z95811 Presence of heart assist device: Secondary | ICD-10-CM | POA: Diagnosis not present

## 2022-06-03 LAB — BASIC METABOLIC PANEL
Anion gap: 7 (ref 5–15)
BUN: 42 mg/dL — ABNORMAL HIGH (ref 8–23)
CO2: 26 mmol/L (ref 22–32)
Calcium: 7.9 mg/dL — ABNORMAL LOW (ref 8.9–10.3)
Chloride: 101 mmol/L (ref 98–111)
Creatinine, Ser: 2.47 mg/dL — ABNORMAL HIGH (ref 0.61–1.24)
GFR, Estimated: 29 mL/min — ABNORMAL LOW (ref >60)
Glucose, Bld: 149 mg/dL — ABNORMAL HIGH (ref 70–99)
Potassium: 4.4 mmol/L (ref 3.5–5.1)
Sodium: 134 mmol/L — ABNORMAL LOW (ref 135–145)

## 2022-06-03 LAB — CBC
HCT: 24.6 % — ABNORMAL LOW (ref 39.0–52.0)
Hemoglobin: 8.2 g/dL — ABNORMAL LOW (ref 13.0–17.0)
MCH: 27.6 pg (ref 26.0–34.0)
MCHC: 33.3 g/dL (ref 30.0–36.0)
MCV: 82.8 fL (ref 80.0–100.0)
Platelets: 333 10*3/uL (ref 150–400)
RBC: 2.97 MIL/uL — ABNORMAL LOW (ref 4.22–5.81)
RDW: 16 % — ABNORMAL HIGH (ref 11.5–15.5)
WBC: 11.1 10*3/uL — ABNORMAL HIGH (ref 4.0–10.5)
nRBC: 0 % (ref 0.0–0.2)

## 2022-06-03 LAB — CULTURE, BLOOD (ROUTINE X 2)
Culture: NO GROWTH
Culture: NO GROWTH
Special Requests: ADEQUATE
Special Requests: ADEQUATE

## 2022-06-03 LAB — PROTIME-INR
INR: 2.2 — ABNORMAL HIGH (ref 0.8–1.2)
Prothrombin Time: 24.6 seconds — ABNORMAL HIGH (ref 11.4–15.2)

## 2022-06-03 LAB — GLUCOSE, CAPILLARY
Glucose-Capillary: 131 mg/dL — ABNORMAL HIGH (ref 70–99)
Glucose-Capillary: 227 mg/dL — ABNORMAL HIGH (ref 70–99)

## 2022-06-03 LAB — LACTATE DEHYDROGENASE: LDH: 157 U/L (ref 98–192)

## 2022-06-03 LAB — MAGNESIUM: Magnesium: 1.8 mg/dL (ref 1.7–2.4)

## 2022-06-03 MED ORDER — WARFARIN SODIUM 3 MG PO TABS
6.0000 mg | ORAL_TABLET | ORAL | Status: AC
Start: 2022-06-03 — End: 2022-06-03
  Administered 2022-06-03: 6 mg via ORAL
  Filled 2022-06-03: qty 2

## 2022-06-03 MED ORDER — DAPTOMYCIN IV (FOR PTA / DISCHARGE USE ONLY): 650.0000 mg | INTRAVENOUS | 0 refills | Status: DC

## 2022-06-03 MED ORDER — GLIPIZIDE 5 MG PO TABS
5.0000 mg | ORAL_TABLET | Freq: Every day | ORAL | 11 refills | Status: DC
  Filled 2022-06-03: qty 30, 30d supply, fill #0

## 2022-06-03 MED ORDER — SODIUM CHLORIDE 0.9 % IV SOLN
8.0000 mg/kg | INTRAVENOUS | Status: DC
  Administered 2022-06-03: 650 mg via INTRAVENOUS
  Filled 2022-06-03 (×2): qty 13

## 2022-06-03 NOTE — Progress Notes (Signed)
PHARMACY CONSULT NOTE FOR:  OUTPATIENT  PARENTERAL ANTIBIOTIC THERAPY (OPAT) - updated 6/21  Indication: MRSA LVAD Driveline Infection Regimen: Daptomycin 650 mg IV every 24 hours Updated from q48h now that renal function improved - discuse with ID and DC OPAT orders updated  End date: 06/20/22  IV antibiotic discharge orders are pended. To discharging provider:  please sign these orders via discharge navigator,  Select New Orders & click on the button choice - Manage This Unsigned Work.      Leota Sauers Pharm.D. CPP, BCPS Clinical Pharmacist [phone] 06/03/2022 9:18 AM

## 2022-06-03 NOTE — Inpatient Diabetes Management (Addendum)
Inpatient Diabetes Program Recommendations  AACE/ADA: New Consensus Statement on Inpatient Glycemic Control (2015)  Target Ranges:  Prepandial:   less than 140 mg/dL      Peak postprandial:   less than 180 mg/dL (1-2 hours)      Critically ill patients:  140 - 180 mg/dL   Lab Results  Component Value Date   GLUCAP 227 (H) 06/03/2022   HGBA1C 8.2 (H) 05/13/2022    Review of Glycemic Control  Latest Reference Range & Units 06/02/22 06:37 06/02/22 11:33 06/02/22 15:31 06/02/22 21:16 06/03/22 06:49 06/03/22 11:55  Glucose-Capillary 70 - 99 mg/dL 098 (H) 119 (H) 73 147 (H) 131 (H) 227 (H)  (H): Data is abnormally high  Diabetes history: DM 2 Outpatient Diabetes medications:  Metformin 1000 mg bid Ozempic weekly Current orders for Inpatient glycemic control:  Novolog moderate tid with meals and HS Novolog 5 units tid with meals Semglee 24 units daily  Inpatient Diabetes Program Recommendations:    Please continue Ozempic at DC.    Spoke with patient.  He states he has not been able to get Ozempic from [facility] [place] as they have been out for several weeks.  Can we get from [facility] pharmacy?  Sent message to CM.  Called [facility] pharmacy and they do have it there.  Will ask Amy Clegg to send to [facility] pharmacy.  If they cannot fill it he is aware to try other [facility].  He should follow up with PCP in 1-2 weeks.  Per Tonye Becket, he has an endocrinologist appointment in July.    Will continue to follow while inpatient.  Thank you, Dulce Sellar, MSN, CDCES Diabetes Coordinator Inpatient Diabetes Program [phone] (team pager from 8a-5p)

## 2022-06-04 ENCOUNTER — Inpatient Hospital Stay (HOSPITAL_COMMUNITY): Payer: 59

## 2022-06-04 ENCOUNTER — Emergency Department (HOSPITAL_COMMUNITY): Payer: 59

## 2022-06-04 ENCOUNTER — Inpatient Hospital Stay (HOSPITAL_COMMUNITY)
Admission: EM | Admit: 2022-06-04 | Discharge: 2022-06-05 | DRG: 315 | Disposition: A | Discharge status: Home / Self Care | Payer: 59 | Attending: Cardiology | Admitting: Cardiology

## 2022-06-04 ENCOUNTER — Other Ambulatory Visit: Payer: Self-pay

## 2022-06-04 ENCOUNTER — Encounter (HOSPITAL_COMMUNITY): Payer: Self-pay | Admitting: Emergency Medicine

## 2022-06-04 DIAGNOSIS — I5022 Chronic systolic (congestive) heart failure: Secondary | ICD-10-CM | POA: Diagnosis present

## 2022-06-04 DIAGNOSIS — Z23 Encounter for immunization: Secondary | ICD-10-CM | POA: Diagnosis present

## 2022-06-04 DIAGNOSIS — E0590 Thyrotoxicosis, unspecified without thyrotoxic crisis or storm: Secondary | ICD-10-CM | POA: Diagnosis present

## 2022-06-04 DIAGNOSIS — T82598D Other mechanical complication of other cardiac and vascular devices and implants, subsequent encounter: Principal | ICD-10-CM

## 2022-06-04 DIAGNOSIS — M109 Gout, unspecified: Secondary | ICD-10-CM | POA: Diagnosis present

## 2022-06-04 DIAGNOSIS — T462X5A Adverse effect of other antidysrhythmic drugs, initial encounter: Secondary | ICD-10-CM | POA: Diagnosis present

## 2022-06-04 DIAGNOSIS — Z7951 Long term (current) use of inhaled steroids: Secondary | ICD-10-CM | POA: Diagnosis not present

## 2022-06-04 DIAGNOSIS — M1990 Unspecified osteoarthritis, unspecified site: Secondary | ICD-10-CM | POA: Diagnosis present

## 2022-06-04 DIAGNOSIS — Z7901 Long term (current) use of anticoagulants: Secondary | ICD-10-CM

## 2022-06-04 DIAGNOSIS — B9562 Methicillin resistant Staphylococcus aureus infection as the cause of diseases classified elsewhere: Secondary | ICD-10-CM | POA: Diagnosis present

## 2022-06-04 DIAGNOSIS — Y831 Surgical operation with implant of artificial internal device as the cause of abnormal reaction of the patient, or of later complication, without mention of misadventure at the time of the procedure: Secondary | ICD-10-CM | POA: Diagnosis present

## 2022-06-04 DIAGNOSIS — I110 Hypertensive heart disease with heart failure: Secondary | ICD-10-CM | POA: Diagnosis present

## 2022-06-04 DIAGNOSIS — I480 Paroxysmal atrial fibrillation: Secondary | ICD-10-CM | POA: Diagnosis present

## 2022-06-04 DIAGNOSIS — Z7984 Long term (current) use of oral hypoglycemic drugs: Secondary | ICD-10-CM

## 2022-06-04 DIAGNOSIS — F1510 Other stimulant abuse, uncomplicated: Secondary | ICD-10-CM | POA: Diagnosis present

## 2022-06-04 DIAGNOSIS — Z825 Family history of asthma and other chronic lower respiratory diseases: Secondary | ICD-10-CM

## 2022-06-04 DIAGNOSIS — I255 Ischemic cardiomyopathy: Secondary | ICD-10-CM | POA: Diagnosis present

## 2022-06-04 DIAGNOSIS — Z951 Presence of aortocoronary bypass graft: Secondary | ICD-10-CM | POA: Diagnosis not present

## 2022-06-04 DIAGNOSIS — T82598A Other mechanical complication of other cardiac and vascular devices and implants, initial encounter: Secondary | ICD-10-CM | POA: Diagnosis not present

## 2022-06-04 DIAGNOSIS — Z95811 Presence of heart assist device: Secondary | ICD-10-CM | POA: Diagnosis not present

## 2022-06-04 DIAGNOSIS — Z87891 Personal history of nicotine dependence: Secondary | ICD-10-CM | POA: Diagnosis not present

## 2022-06-04 DIAGNOSIS — R109 Unspecified abdominal pain: Secondary | ICD-10-CM | POA: Diagnosis not present

## 2022-06-04 DIAGNOSIS — N179 Acute kidney failure, unspecified: Secondary | ICD-10-CM | POA: Diagnosis present

## 2022-06-04 DIAGNOSIS — I2510 Atherosclerotic heart disease of native coronary artery without angina pectoris: Secondary | ICD-10-CM | POA: Diagnosis present

## 2022-06-04 DIAGNOSIS — J432 Centrilobular emphysema: Secondary | ICD-10-CM | POA: Diagnosis present

## 2022-06-04 DIAGNOSIS — E119 Type 2 diabetes mellitus without complications: Secondary | ICD-10-CM | POA: Diagnosis present

## 2022-06-04 DIAGNOSIS — T827XXA Infection and inflammatory reaction due to other cardiac and vascular devices, implants and grafts, initial encounter: Secondary | ICD-10-CM | POA: Diagnosis present

## 2022-06-04 DIAGNOSIS — Z833 Family history of diabetes mellitus: Secondary | ICD-10-CM | POA: Diagnosis not present

## 2022-06-04 DIAGNOSIS — D649 Anemia, unspecified: Secondary | ICD-10-CM | POA: Diagnosis present

## 2022-06-04 DIAGNOSIS — Z79899 Other long term (current) drug therapy: Secondary | ICD-10-CM

## 2022-06-04 DIAGNOSIS — R1010 Upper abdominal pain, unspecified: Secondary | ICD-10-CM

## 2022-06-04 LAB — COMPREHENSIVE METABOLIC PANEL
ALT: 66 U/L — ABNORMAL HIGH (ref 0–44)
AST: 61 U/L — ABNORMAL HIGH (ref 15–41)
Albumin: 2.2 g/dL — ABNORMAL LOW (ref 3.5–5.0)
Alkaline Phosphatase: 104 U/L (ref 38–126)
Anion gap: 9 (ref 5–15)
BUN: 37 mg/dL — ABNORMAL HIGH (ref 8–23)
CO2: 23 mmol/L (ref 22–32)
Calcium: 8 mg/dL — ABNORMAL LOW (ref 8.9–10.3)
Chloride: 107 mmol/L (ref 98–111)
Creatinine, Ser: 2.22 mg/dL — ABNORMAL HIGH (ref 0.61–1.24)
GFR, Estimated: 33 mL/min — ABNORMAL LOW (ref >60)
Glucose, Bld: 221 mg/dL — ABNORMAL HIGH (ref 70–99)
Potassium: 4.4 mmol/L (ref 3.5–5.1)
Sodium: 139 mmol/L (ref 135–145)
Total Bilirubin: 0.1 mg/dL — ABNORMAL LOW (ref 0.3–1.2)
Total Protein: 6 g/dL — ABNORMAL LOW (ref 6.5–8.1)

## 2022-06-04 LAB — CBC WITH DIFFERENTIAL/PLATELET
Abs Immature Granulocytes: 0.08 10*3/uL — ABNORMAL HIGH (ref 0.00–0.07)
Basophils Absolute: 0 10*3/uL (ref 0.0–0.1)
Basophils Relative: 0 %
Eosinophils Absolute: 0 10*3/uL (ref 0.0–0.5)
Eosinophils Relative: 0 %
HCT: 23.6 % — ABNORMAL LOW (ref 39.0–52.0)
Hemoglobin: 7.6 g/dL — ABNORMAL LOW (ref 13.0–17.0)
Immature Granulocytes: 1 %
Lymphocytes Relative: 9 %
Lymphs Abs: 1.1 10*3/uL (ref 0.7–4.0)
MCH: 27 pg (ref 26.0–34.0)
MCHC: 32.2 g/dL (ref 30.0–36.0)
MCV: 84 fL (ref 80.0–100.0)
Monocytes Absolute: 1.1 10*3/uL — ABNORMAL HIGH (ref 0.1–1.0)
Monocytes Relative: 9 %
Neutro Abs: 9.6 10*3/uL — ABNORMAL HIGH (ref 1.7–7.7)
Neutrophils Relative %: 81 %
Platelets: 288 10*3/uL (ref 150–400)
RBC: 2.81 MIL/uL — ABNORMAL LOW (ref 4.22–5.81)
RDW: 16 % — ABNORMAL HIGH (ref 11.5–15.5)
WBC: 11.8 10*3/uL — ABNORMAL HIGH (ref 4.0–10.5)
nRBC: 0 % (ref 0.0–0.2)

## 2022-06-04 LAB — LACTIC ACID, PLASMA
Lactic Acid, Venous: 1.9 mmol/L (ref 0.5–1.9)
Lactic Acid, Venous: 1 mmol/L (ref 0.5–1.9)

## 2022-06-04 LAB — PROTIME-INR
INR: 2.1 — ABNORMAL HIGH (ref 0.8–1.2)
Prothrombin Time: 23.2 seconds — ABNORMAL HIGH (ref 11.4–15.2)

## 2022-06-04 LAB — APTT: aPTT: 54 seconds — ABNORMAL HIGH (ref 24–36)

## 2022-06-04 LAB — LACTATE DEHYDROGENASE: LDH: 227 U/L — ABNORMAL HIGH (ref 98–192)

## 2022-06-04 LAB — GLUCOSE, CAPILLARY: Glucose-Capillary: 128 mg/dL — ABNORMAL HIGH (ref 70–99)

## 2022-06-04 MED ORDER — OXYCODONE HCL 5 MG PO TABS
5.0000 mg | ORAL_TABLET | ORAL | Status: DC | PRN
  Administered 2022-06-04 – 2022-06-05 (×3): 5 mg via ORAL
  Filled 2022-06-04 (×3): qty 1

## 2022-06-04 MED ORDER — HYDRALAZINE HCL 25 MG PO TABS
25.0000 mg | ORAL_TABLET | Freq: Three times a day (TID) | ORAL | Status: DC
  Administered 2022-06-04 – 2022-06-05 (×2): 25 mg via ORAL
  Filled 2022-06-04 (×2): qty 1

## 2022-06-04 MED ORDER — FLUTICASONE FUROATE-VILANTEROL 100-25 MCG/ACT IN AEPB
1.0000 | INHALATION_SPRAY | Freq: Every day | RESPIRATORY_TRACT | Status: DC
  Administered 2022-06-05: 1 via RESPIRATORY_TRACT
  Filled 2022-06-04: qty 28

## 2022-06-04 MED ORDER — ALBUTEROL SULFATE (2.5 MG/3ML) 0.083% IN NEBU: 2.5000 mg | INHALATION_SOLUTION | Freq: Four times a day (QID) | RESPIRATORY_TRACT | Status: DC | PRN

## 2022-06-04 MED ORDER — MORPHINE SULFATE (PF) 2 MG/ML IV SOLN
2.0000 mg | INTRAVENOUS | Status: DC | PRN
  Administered 2022-06-04 – 2022-06-05 (×3): 2 mg via INTRAVENOUS
  Filled 2022-06-04 (×3): qty 1

## 2022-06-04 MED ORDER — GLIPIZIDE 5 MG PO TABS: 5.0000 mg | ORAL_TABLET | Freq: Every day | ORAL | Status: DC

## 2022-06-04 MED ORDER — ONDANSETRON HCL 4 MG/2ML IJ SOLN
4.0000 mg | Freq: Once | INTRAMUSCULAR | Status: AC
  Administered 2022-06-04: 4 mg via INTRAVENOUS
  Filled 2022-06-04: qty 2

## 2022-06-04 MED ORDER — LORATADINE 10 MG PO TABS
10.0000 mg | ORAL_TABLET | Freq: Every day | ORAL | Status: DC
  Administered 2022-06-04 – 2022-06-05 (×2): 10 mg via ORAL
  Filled 2022-06-04 (×2): qty 1

## 2022-06-04 MED ORDER — ONDANSETRON HCL 4 MG/2ML IJ SOLN: 4.0000 mg | Freq: Four times a day (QID) | INTRAMUSCULAR | Status: DC | PRN

## 2022-06-04 MED ORDER — GABAPENTIN 600 MG PO TABS
600.0000 mg | ORAL_TABLET | Freq: Two times a day (BID) | ORAL | Status: DC
  Administered 2022-06-04 – 2022-06-05 (×2): 600 mg via ORAL
  Filled 2022-06-04 (×2): qty 1

## 2022-06-04 MED ORDER — ROSUVASTATIN CALCIUM 20 MG PO TABS
20.0000 mg | ORAL_TABLET | Freq: Every day | ORAL | Status: DC
  Administered 2022-06-04 – 2022-06-05 (×2): 20 mg via ORAL
  Filled 2022-06-04 (×2): qty 1

## 2022-06-04 MED ORDER — TRAZODONE HCL 50 MG PO TABS
150.0000 mg | ORAL_TABLET | Freq: Every day | ORAL | Status: DC
  Administered 2022-06-04: 150 mg via ORAL
  Filled 2022-06-04: qty 3

## 2022-06-04 MED ORDER — ALLOPURINOL 100 MG PO TABS
100.0000 mg | ORAL_TABLET | Freq: Every day | ORAL | Status: DC
  Administered 2022-06-04 – 2022-06-05 (×2): 100 mg via ORAL
  Filled 2022-06-04 (×2): qty 1

## 2022-06-04 MED ORDER — HYDRALAZINE HCL 20 MG/ML IJ SOLN
10.0000 mg | Freq: Once | INTRAMUSCULAR | Status: AC
  Administered 2022-06-04: 10 mg via INTRAVENOUS
  Filled 2022-06-04: qty 1

## 2022-06-04 MED ORDER — FENTANYL CITRATE PF 50 MCG/ML IJ SOSY
50.0000 ug | PREFILLED_SYRINGE | Freq: Once | INTRAMUSCULAR | Status: AC
  Administered 2022-06-04: 50 ug via INTRAVENOUS
  Filled 2022-06-04: qty 1

## 2022-06-04 MED ORDER — METOPROLOL SUCCINATE ER 25 MG PO TB24
25.0000 mg | ORAL_TABLET | Freq: Every day | ORAL | Status: DC
Start: 2022-06-04 — End: 2022-06-05
  Administered 2022-06-04 – 2022-06-05 (×2): 25 mg via ORAL
  Filled 2022-06-04 (×2): qty 1

## 2022-06-04 MED ORDER — PANTOPRAZOLE SODIUM 40 MG PO TBEC
40.0000 mg | DELAYED_RELEASE_TABLET | Freq: Every day | ORAL | Status: DC
  Administered 2022-06-04 – 2022-06-05 (×2): 40 mg via ORAL
  Filled 2022-06-04 (×2): qty 1

## 2022-06-04 MED ORDER — METHIMAZOLE 10 MG PO TABS
20.0000 mg | ORAL_TABLET | Freq: Two times a day (BID) | ORAL | Status: DC
  Administered 2022-06-04 – 2022-06-05 (×2): 20 mg via ORAL
  Filled 2022-06-04 (×3): qty 2

## 2022-06-04 MED ORDER — ADULT MULTIVITAMIN W/MINERALS CH
1.0000 | ORAL_TABLET | Freq: Every day | ORAL | Status: DC
  Administered 2022-06-04 – 2022-06-05 (×2): 1 via ORAL
  Filled 2022-06-04 (×2): qty 1

## 2022-06-04 MED ORDER — INSULIN ASPART 100 UNIT/ML IJ SOLN: 0.0000 [IU] | Freq: Every day | INTRAMUSCULAR | Status: DC

## 2022-06-04 MED ORDER — INSULIN ASPART 100 UNIT/ML IJ SOLN
0.0000 [IU] | Freq: Three times a day (TID) | INTRAMUSCULAR | Status: DC
  Administered 2022-06-05: 2 [IU] via SUBCUTANEOUS

## 2022-06-04 MED ORDER — DAPTOMYCIN IV (FOR PTA / DISCHARGE USE ONLY): 650.0000 mg | INTRAVENOUS | Status: DC

## 2022-06-04 MED ORDER — SODIUM CHLORIDE 0.9 % IV SOLN
650.0000 mg | Freq: Once | INTRAVENOUS | Status: AC
  Administered 2022-06-04: 650 mg via INTRAVENOUS
  Filled 2022-06-04: qty 13

## 2022-06-04 MED ORDER — WARFARIN SODIUM 6 MG PO TABS
6.0000 mg | ORAL_TABLET | Freq: Once | ORAL | Status: AC
Start: 2022-06-04 — End: 2022-06-04
  Administered 2022-06-04: 6 mg via ORAL
  Filled 2022-06-04: qty 1

## 2022-06-04 MED ORDER — WARFARIN - PHARMACIST DOSING INPATIENT: Freq: Every day | Status: DC

## 2022-06-04 MED ORDER — DAPTOMYCIN 500 MG IV SOLR
8.0000 mg/kg | INTRAVENOUS | Status: DC
  Filled 2022-06-04: qty 13

## 2022-06-04 MED ORDER — SERTRALINE HCL 25 MG PO TABS
50.0000 mg | ORAL_TABLET | Freq: Every day | ORAL | Status: DC
  Administered 2022-06-04 – 2022-06-05 (×2): 50 mg via ORAL
  Filled 2022-06-04: qty 2
  Filled 2022-06-04: qty 1

## 2022-06-04 MED ORDER — GUAIFENESIN ER 600 MG PO TB12
600.0000 mg | ORAL_TABLET | Freq: Two times a day (BID) | ORAL | Status: DC
  Administered 2022-06-04 – 2022-06-05 (×2): 600 mg via ORAL
  Filled 2022-06-04 (×2): qty 1

## 2022-06-04 MED ORDER — GLIPIZIDE 5 MG PO TABS
5.0000 mg | ORAL_TABLET | Freq: Every day | ORAL | Status: DC
  Administered 2022-06-05: 5 mg via ORAL
  Filled 2022-06-04: qty 1

## 2022-06-04 MED ORDER — ACETAMINOPHEN 325 MG PO TABS
650.0000 mg | ORAL_TABLET | ORAL | Status: DC | PRN
  Administered 2022-06-05 (×2): 650 mg via ORAL
  Filled 2022-06-04 (×2): qty 2
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
## 2022-06-05 ENCOUNTER — Other Ambulatory Visit (HOSPITAL_COMMUNITY): Payer: Self-pay

## 2022-06-05 DIAGNOSIS — T827XXA Infection and inflammatory reaction due to other cardiac and vascular devices, implants and grafts, initial encounter: Secondary | ICD-10-CM | POA: Diagnosis not present

## 2022-06-05 DIAGNOSIS — Z95811 Presence of heart assist device: Secondary | ICD-10-CM | POA: Diagnosis not present

## 2022-06-05 LAB — BASIC METABOLIC PANEL
Anion gap: 9 (ref 5–15)
BUN: 31 mg/dL — ABNORMAL HIGH (ref 8–23)
CO2: 25 mmol/L (ref 22–32)
Calcium: 8 mg/dL — ABNORMAL LOW (ref 8.9–10.3)
Chloride: 104 mmol/L (ref 98–111)
Creatinine, Ser: 2.05 mg/dL — ABNORMAL HIGH (ref 0.61–1.24)
GFR, Estimated: 36 mL/min — ABNORMAL LOW (ref >60)
Glucose, Bld: 120 mg/dL — ABNORMAL HIGH (ref 70–99)
Potassium: 4.2 mmol/L (ref 3.5–5.1)
Sodium: 138 mmol/L (ref 135–145)

## 2022-06-05 LAB — PROTIME-INR
INR: 2.2 — ABNORMAL HIGH (ref 0.8–1.2)
Prothrombin Time: 24.2 seconds — ABNORMAL HIGH (ref 11.4–15.2)

## 2022-06-05 LAB — GLUCOSE, CAPILLARY
Glucose-Capillary: 126 mg/dL — ABNORMAL HIGH (ref 70–99)
Glucose-Capillary: 106 mg/dL — ABNORMAL HIGH (ref 70–99)

## 2022-06-05 LAB — CBC
HCT: 23.7 % — ABNORMAL LOW (ref 39.0–52.0)
Hemoglobin: 7.7 g/dL — ABNORMAL LOW (ref 13.0–17.0)
MCH: 26.8 pg (ref 26.0–34.0)
MCHC: 32.5 g/dL (ref 30.0–36.0)
MCV: 82.6 fL (ref 80.0–100.0)
Platelets: 314 10*3/uL (ref 150–400)
RBC: 2.87 MIL/uL — ABNORMAL LOW (ref 4.22–5.81)
RDW: 16.1 % — ABNORMAL HIGH (ref 11.5–15.5)
WBC: 10.8 10*3/uL — ABNORMAL HIGH (ref 4.0–10.5)
nRBC: 0 % (ref 0.0–0.2)

## 2022-06-05 LAB — IRON AND TIBC
Iron: 14 ug/dL — ABNORMAL LOW (ref 45–182)
Saturation Ratios: 7 % — ABNORMAL LOW (ref 17.9–39.5)
TIBC: 213 ug/dL — ABNORMAL LOW (ref 250–450)
UIBC: 199 ug/dL

## 2022-06-05 LAB — FERRITIN: Ferritin: 338 ng/mL — ABNORMAL HIGH (ref 24–336)

## 2022-06-05 LAB — CK: Total CK: 92 U/L (ref 49–397)

## 2022-06-05 LAB — LACTATE DEHYDROGENASE: LDH: 166 U/L (ref 98–192)

## 2022-06-05 MED ORDER — CHLORHEXIDINE GLUCONATE CLOTH 2 % EX PADS
6.0000 | MEDICATED_PAD | Freq: Every day | CUTANEOUS | Status: DC
  Administered 2022-06-05: 6 via TOPICAL

## 2022-06-05 MED ORDER — SODIUM CHLORIDE 0.9 % IV SOLN
8.0000 mg/kg | INTRAVENOUS | Status: DC
  Administered 2022-06-05: 650 mg via INTRAVENOUS
  Filled 2022-06-05: qty 13

## 2022-06-05 MED ORDER — WARFARIN SODIUM 3 MG PO TABS: 6.0000 mg | ORAL_TABLET | ORAL | Status: DC

## 2022-06-05 MED ORDER — HYDRALAZINE HCL 25 MG PO TABS
25.0000 mg | ORAL_TABLET | Freq: Three times a day (TID) | ORAL | 5 refills | Status: DC
  Filled 2022-06-05: qty 90, 30d supply, fill #0

## 2022-06-05 MED ORDER — WARFARIN SODIUM 4 MG PO TABS: 4.0000 mg | ORAL_TABLET | ORAL | Status: DC

## 2022-06-05 MED ORDER — PNEUMOCOCCAL 20-VAL CONJ VACC 0.5 ML IM SUSY
0.5000 mL | PREFILLED_SYRINGE | Freq: Once | INTRAMUSCULAR | Status: AC
  Administered 2022-06-05: 0.5 mL via INTRAMUSCULAR
  Filled 2022-06-05 (×2): qty 0.5

## 2022-06-05 MED ORDER — SODIUM CHLORIDE 0.9 % IV SOLN
510.0000 mg | Freq: Once | INTRAVENOUS | Status: AC
  Administered 2022-06-05: 510 mg via INTRAVENOUS
  Filled 2022-06-05: qty 17
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
## 2022-06-05 NOTE — TOC Transition Note (Signed)
Transition of Care [facility]) - CM/SW Discharge Note   Patient Details  Name: Kevin Underwood MRN: [id number] Date of Birth: Aug 01, 1961  [facility] CM/SW Contact:  Leone Haven, RN Phone Number: 06/05/2022, 11:46 AM   Clinical Narrative:    Patient is for dc today, per Jeri Modena he is good to go,  his wife will continue to do the drive line dressing and infusion at his mothers house at address [address], [place] .           Patient Goals and CMS Choice        Discharge Placement                       Discharge Plan and Services                                     Social Determinants of Health (SDOH) Interventions     Readmission Risk Interventions    11/28/2020   11:57 AM  Readmission Risk Prevention Plan  Transportation Screening Complete  Medication Review (RN Care Manager) Complete  HRI or Home Care Consult Complete  SW Recovery Care/Counseling Consult Complete  Palliative Care Screening Not Applicable  Skilled Nursing Facility Not Applicable
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
## 2022-06-05 NOTE — Progress Notes (Addendum)
Spoke to the Pt.  He is not aware to the fact that he has received the Pneumo vaccine in the past.  It is not indicated on his immunization records that he has received it from [place].   Spoke to Pharmacy, and he could receive it today, prior to his discharge

## 2022-06-06 ENCOUNTER — Ambulatory Visit (HOSPITAL_COMMUNITY): Payer: Self-pay | Admitting: Pharmacist

## 2022-06-06 LAB — POCT INR: INR: 2.6 (ref 2.0–3.0)

## 2022-06-06 NOTE — Progress Notes (Signed)
LVAD INR 

## 2022-06-07 ENCOUNTER — Encounter (HOSPITAL_COMMUNITY): Payer: 59

## 2022-06-07 ENCOUNTER — Ambulatory Visit (HOSPITAL_COMMUNITY): Payer: Self-pay | Admitting: Pharmacist

## 2022-06-07 ENCOUNTER — Encounter (HOSPITAL_COMMUNITY): Payer: Self-pay

## 2022-06-07 ENCOUNTER — Ambulatory Visit (HOSPITAL_COMMUNITY)
Admit: 2022-06-07 | Discharge: 2022-06-07 | Disposition: A | Discharge status: Home / Self Care | Payer: 59 | Attending: Internal Medicine | Admitting: Internal Medicine

## 2022-06-07 ENCOUNTER — Telehealth (HOSPITAL_COMMUNITY): Payer: Self-pay | Admitting: *Deleted

## 2022-06-07 VITALS — BP 130/97 | HR 67 | Temp 98.0°F | Wt 188.4 lb

## 2022-06-07 DIAGNOSIS — Z95811 Presence of heart assist device: Secondary | ICD-10-CM | POA: Insufficient documentation

## 2022-06-07 DIAGNOSIS — T827XXA Infection and inflammatory reaction due to other cardiac and vascular devices, implants and grafts, initial encounter: Secondary | ICD-10-CM | POA: Diagnosis present

## 2022-06-07 DIAGNOSIS — Y710 Diagnostic and monitoring cardiovascular devices associated with adverse incidents: Secondary | ICD-10-CM | POA: Insufficient documentation

## 2022-06-07 DIAGNOSIS — Z7901 Long term (current) use of anticoagulants: Secondary | ICD-10-CM | POA: Insufficient documentation

## 2022-06-07 DIAGNOSIS — R52 Pain, unspecified: Secondary | ICD-10-CM

## 2022-06-07 LAB — PROTIME-INR
INR: 2.4 — ABNORMAL HIGH (ref 0.8–1.2)
Prothrombin Time: 25.5 seconds — ABNORMAL HIGH (ref 11.4–15.2)

## 2022-06-07 MED ORDER — TRAMADOL HCL 50 MG PO TABS: 50.0000 mg | ORAL_TABLET | Freq: Two times a day (BID) | ORAL | 0 refills | Status: DC | PRN

## 2022-06-07 NOTE — Patient Instructions (Signed)
Lauren (PharmD) will call you with INR results and warfarin dosing. Continue daily dressing changes. Hospital admissions will call you Wednesday, 06/12/22 for scheduled admission to [facility].  Planned return trip to OR Friday for drive line exit site clean out. You will probably go home with wound vac. Call VAD pager if any issues arise prior to your admission.

## 2022-06-07 NOTE — Telephone Encounter (Signed)
Wife called VAD clinic to report patient having pain from DL site since visit today. Rx for tramadol sent per Dr. Shirlee Latch.  Hessie Diener RN, VAD Coordinator [phone]

## 2022-06-07 NOTE — Progress Notes (Signed)
Patient presents for drive line wound check with Dr. Maren Beach  in VAD Clinic today with wife [name]). Reports no problems with VAD equipment and denies any new issues with drive line site.   Wife confirms she has all the medications she needs from hospital discharge. She confirms she has IV antibiotics and has been giving once daily in the mornings. She has been changing his drive line dressing daily with "very little" drainage noted.   Dr. Donata Clay in to assess drive line site. With increased drainage and tunneling, woud culture obtained. Planned admission next Wednesday 06/12/22 for wound debridement in OR Friday 06/14/22. Pt will need wound vac placed at that time. See note below.   Pt c/o back pain that he reports as chronic. Denies exit site pain. Wife says he has been having some right side pain radiating to back.    Vital Signs:  Temp: 98.0 Automatc BP: 130/97 (109) HR:  67 SPO2: 100 % on RA   Weight: 188.4 lb w/o eqt Hospital d/c weight:  187.6 lb  LVAD assessment:  VAD Speed: 5500 rpms Flow:  3.9 Power: 4.0 w    PI: 4.5 Alarms: no clinical alarms  Events: 14 PI events 06/06/22 Hct: 24  Fixed speed: 5500 Low speed limit:  5200  Exit Site Care: Existing VAD dressing removed and site care performed using sterile technique per Dr. Maren Beach. Drive line exit site cleaned with Chlora prep applicators x 2, allowed to dry, packed with 2 saline moistened 4 x 4 and covered with several 4 x 4. Large amount of thick, fouls smelling, greenish drainage noted in wound bed and on dressing. Site tunneled @ 14 cm. Site packed with 2 saline moistened 4 x 4 gauze and covered with dry sterile gauze dressing. Drive line anchor re-applied. Wife says she has adequate dressing supplies at home.     Patient Instructions: Lauren (PharmD) will call you with INR results and warfarin dosing. Continue daily dressing changes. Hospital admissions will call you Wednesday, 06/12/22 for scheduled admission to [facility].   Planned return trip to OR Friday for drive line exit site clean out. You will probably go home with wound vac. Call VAD pager if any issues arise prior to your admission.      Hessie Diener, RN VAD Coordinator    Office: [phone] 24/7 Emergency VAD Pager: [phone]

## 2022-06-09 LAB — AEROBIC CULTURE W GRAM STAIN (SUPERFICIAL SPECIMEN): Gram Stain: NONE SEEN

## 2022-06-12 ENCOUNTER — Encounter (HOSPITAL_COMMUNITY): Payer: Self-pay | Admitting: Cardiology

## 2022-06-12 ENCOUNTER — Other Ambulatory Visit: Payer: Self-pay

## 2022-06-12 ENCOUNTER — Inpatient Hospital Stay (HOSPITAL_COMMUNITY)
Admission: AD | Admit: 2022-06-12 | Discharge: 2022-07-18 | DRG: 264 | Disposition: A | Discharge status: Home / Self Care | Payer: 59 | Attending: Cardiology | Admitting: Cardiology

## 2022-06-12 DIAGNOSIS — I480 Paroxysmal atrial fibrillation: Secondary | ICD-10-CM | POA: Diagnosis present

## 2022-06-12 DIAGNOSIS — Z792 Long term (current) use of antibiotics: Secondary | ICD-10-CM

## 2022-06-12 DIAGNOSIS — M109 Gout, unspecified: Secondary | ICD-10-CM | POA: Diagnosis present

## 2022-06-12 DIAGNOSIS — N179 Acute kidney failure, unspecified: Secondary | ICD-10-CM | POA: Diagnosis present

## 2022-06-12 DIAGNOSIS — E1122 Type 2 diabetes mellitus with diabetic chronic kidney disease: Secondary | ICD-10-CM | POA: Diagnosis present

## 2022-06-12 DIAGNOSIS — I130 Hypertensive heart and chronic kidney disease with heart failure and stage 1 through stage 4 chronic kidney disease, or unspecified chronic kidney disease: Secondary | ICD-10-CM | POA: Diagnosis present

## 2022-06-12 DIAGNOSIS — T462X5A Adverse effect of other antidysrhythmic drugs, initial encounter: Secondary | ICD-10-CM | POA: Diagnosis present

## 2022-06-12 DIAGNOSIS — E0590 Thyrotoxicosis, unspecified without thyrotoxic crisis or storm: Secondary | ICD-10-CM | POA: Diagnosis present

## 2022-06-12 DIAGNOSIS — F1510 Other stimulant abuse, uncomplicated: Secondary | ICD-10-CM | POA: Diagnosis present

## 2022-06-12 DIAGNOSIS — N1831 Chronic kidney disease, stage 3a: Secondary | ICD-10-CM | POA: Diagnosis present

## 2022-06-12 DIAGNOSIS — Y831 Surgical operation with implant of artificial internal device as the cause of abnormal reaction of the patient, or of later complication, without mention of misadventure at the time of the procedure: Secondary | ICD-10-CM | POA: Diagnosis present

## 2022-06-12 DIAGNOSIS — D500 Iron deficiency anemia secondary to blood loss (chronic): Secondary | ICD-10-CM | POA: Diagnosis present

## 2022-06-12 DIAGNOSIS — G8929 Other chronic pain: Secondary | ICD-10-CM | POA: Diagnosis present

## 2022-06-12 DIAGNOSIS — I2510 Atherosclerotic heart disease of native coronary artery without angina pectoris: Secondary | ICD-10-CM | POA: Diagnosis present

## 2022-06-12 DIAGNOSIS — Z951 Presence of aortocoronary bypass graft: Secondary | ICD-10-CM

## 2022-06-12 DIAGNOSIS — I472 Ventricular tachycardia, unspecified: Secondary | ICD-10-CM | POA: Diagnosis not present

## 2022-06-12 DIAGNOSIS — I509 Heart failure, unspecified: Secondary | ICD-10-CM | POA: Diagnosis not present

## 2022-06-12 DIAGNOSIS — Z7901 Long term (current) use of anticoagulants: Secondary | ICD-10-CM

## 2022-06-12 DIAGNOSIS — Z7984 Long term (current) use of oral hypoglycemic drugs: Secondary | ICD-10-CM | POA: Diagnosis not present

## 2022-06-12 DIAGNOSIS — Z95811 Presence of heart assist device: Secondary | ICD-10-CM

## 2022-06-12 DIAGNOSIS — I255 Ischemic cardiomyopathy: Secondary | ICD-10-CM | POA: Diagnosis present

## 2022-06-12 DIAGNOSIS — Z825 Family history of asthma and other chronic lower respiratory diseases: Secondary | ICD-10-CM

## 2022-06-12 DIAGNOSIS — I701 Atherosclerosis of renal artery: Secondary | ICD-10-CM | POA: Diagnosis present

## 2022-06-12 DIAGNOSIS — I459 Conduction disorder, unspecified: Secondary | ICD-10-CM | POA: Diagnosis not present

## 2022-06-12 DIAGNOSIS — Z87891 Personal history of nicotine dependence: Secondary | ICD-10-CM

## 2022-06-12 DIAGNOSIS — J449 Chronic obstructive pulmonary disease, unspecified: Secondary | ICD-10-CM | POA: Diagnosis present

## 2022-06-12 DIAGNOSIS — E1165 Type 2 diabetes mellitus with hyperglycemia: Secondary | ICD-10-CM | POA: Diagnosis present

## 2022-06-12 DIAGNOSIS — Z833 Family history of diabetes mellitus: Secondary | ICD-10-CM

## 2022-06-12 DIAGNOSIS — R748 Abnormal levels of other serum enzymes: Secondary | ICD-10-CM | POA: Diagnosis not present

## 2022-06-12 DIAGNOSIS — Z7951 Long term (current) use of inhaled steroids: Secondary | ICD-10-CM

## 2022-06-12 DIAGNOSIS — B9562 Methicillin resistant Staphylococcus aureus infection as the cause of diseases classified elsewhere: Secondary | ICD-10-CM | POA: Diagnosis present

## 2022-06-12 DIAGNOSIS — I110 Hypertensive heart disease with heart failure: Secondary | ICD-10-CM | POA: Diagnosis not present

## 2022-06-12 DIAGNOSIS — Z95828 Presence of other vascular implants and grafts: Secondary | ICD-10-CM

## 2022-06-12 DIAGNOSIS — G4730 Sleep apnea, unspecified: Secondary | ICD-10-CM | POA: Diagnosis not present

## 2022-06-12 DIAGNOSIS — I5022 Chronic systolic (congestive) heart failure: Secondary | ICD-10-CM | POA: Diagnosis present

## 2022-06-12 DIAGNOSIS — I5023 Acute on chronic systolic (congestive) heart failure: Secondary | ICD-10-CM | POA: Diagnosis not present

## 2022-06-12 DIAGNOSIS — M549 Dorsalgia, unspecified: Secondary | ICD-10-CM | POA: Diagnosis present

## 2022-06-12 DIAGNOSIS — T827XXA Infection and inflammatory reaction due to other cardiac and vascular devices, implants and grafts, initial encounter: Secondary | ICD-10-CM | POA: Diagnosis not present

## 2022-06-12 DIAGNOSIS — I5021 Acute systolic (congestive) heart failure: Secondary | ICD-10-CM | POA: Diagnosis not present

## 2022-06-12 DIAGNOSIS — I25119 Atherosclerotic heart disease of native coronary artery with unspecified angina pectoris: Secondary | ICD-10-CM | POA: Diagnosis not present

## 2022-06-12 DIAGNOSIS — S31109A Unspecified open wound of abdominal wall, unspecified quadrant without penetration into peritoneal cavity, initial encounter: Secondary | ICD-10-CM | POA: Diagnosis not present

## 2022-06-12 DIAGNOSIS — Z79899 Other long term (current) drug therapy: Secondary | ICD-10-CM

## 2022-06-12 DIAGNOSIS — Z8249 Family history of ischemic heart disease and other diseases of the circulatory system: Secondary | ICD-10-CM

## 2022-06-12 LAB — CBC WITH DIFFERENTIAL/PLATELET
Abs Immature Granulocytes: 0.06 10*3/uL (ref 0.00–0.07)
Basophils Absolute: 0.1 10*3/uL (ref 0.0–0.1)
Basophils Relative: 1 %
Eosinophils Absolute: 0 10*3/uL (ref 0.0–0.5)
Eosinophils Relative: 0 %
HCT: 28.6 % — ABNORMAL LOW (ref 39.0–52.0)
Hemoglobin: 9.5 g/dL — ABNORMAL LOW (ref 13.0–17.0)
Immature Granulocytes: 1 %
Lymphocytes Relative: 15 %
Lymphs Abs: 1.6 10*3/uL (ref 0.7–4.0)
MCH: 27.3 pg (ref 26.0–34.0)
MCHC: 33.2 g/dL (ref 30.0–36.0)
MCV: 82.2 fL (ref 80.0–100.0)
Monocytes Absolute: 0.8 10*3/uL (ref 0.1–1.0)
Monocytes Relative: 8 %
Neutro Abs: 8 10*3/uL — ABNORMAL HIGH (ref 1.7–7.7)
Neutrophils Relative %: 75 %
Platelets: 385 10*3/uL (ref 150–400)
RBC: 3.48 MIL/uL — ABNORMAL LOW (ref 4.22–5.81)
RDW: 15.9 % — ABNORMAL HIGH (ref 11.5–15.5)
WBC: 10.5 10*3/uL (ref 4.0–10.5)
nRBC: 0 % (ref 0.0–0.2)

## 2022-06-12 LAB — COMPREHENSIVE METABOLIC PANEL
ALT: 86 U/L — ABNORMAL HIGH (ref 0–44)
AST: 68 U/L — ABNORMAL HIGH (ref 15–41)
Albumin: 2.7 g/dL — ABNORMAL LOW (ref 3.5–5.0)
Alkaline Phosphatase: 114 U/L (ref 38–126)
Anion gap: 9 (ref 5–15)
BUN: 24 mg/dL — ABNORMAL HIGH (ref 8–23)
CO2: 26 mmol/L (ref 22–32)
Calcium: 8.4 mg/dL — ABNORMAL LOW (ref 8.9–10.3)
Chloride: 100 mmol/L (ref 98–111)
Creatinine, Ser: 1.75 mg/dL — ABNORMAL HIGH (ref 0.61–1.24)
GFR, Estimated: 44 mL/min — ABNORMAL LOW (ref >60)
Glucose, Bld: 153 mg/dL — ABNORMAL HIGH (ref 70–99)
Potassium: 3.9 mmol/L (ref 3.5–5.1)
Sodium: 135 mmol/L (ref 135–145)
Total Bilirubin: 0.2 mg/dL — ABNORMAL LOW (ref 0.3–1.2)
Total Protein: 6.9 g/dL (ref 6.5–8.1)

## 2022-06-12 LAB — SURGICAL PCR SCREEN
MRSA, PCR: NEGATIVE
Staphylococcus aureus: NEGATIVE

## 2022-06-12 LAB — GLUCOSE, CAPILLARY
Glucose-Capillary: 138 mg/dL — ABNORMAL HIGH (ref 70–99)
Glucose-Capillary: 137 mg/dL — ABNORMAL HIGH (ref 70–99)
Glucose-Capillary: 164 mg/dL — ABNORMAL HIGH (ref 70–99)

## 2022-06-12 LAB — HEPARIN LEVEL (UNFRACTIONATED)
Heparin Unfractionated: 0.46 IU/mL (ref 0.30–0.70)
Heparin Unfractionated: <0.1 IU/mL — ABNORMAL LOW (ref 0.30–0.70)

## 2022-06-12 LAB — PROTIME-INR
INR: 1.7 — ABNORMAL HIGH (ref 0.8–1.2)
Prothrombin Time: 20 seconds — ABNORMAL HIGH (ref 11.4–15.2)

## 2022-06-12 LAB — LACTATE DEHYDROGENASE: LDH: 244 U/L — ABNORMAL HIGH (ref 98–192)

## 2022-06-12 MED ORDER — METHIMAZOLE 10 MG PO TABS
20.0000 mg | ORAL_TABLET | Freq: Two times a day (BID) | ORAL | Status: DC
  Administered 2022-06-12 – 2022-07-18 (×71): 20 mg via ORAL
  Filled 2022-06-12 (×73): qty 2

## 2022-06-12 MED ORDER — METOPROLOL SUCCINATE ER 25 MG PO TB24
25.0000 mg | ORAL_TABLET | Freq: Every day | ORAL | Status: DC
  Administered 2022-06-13 – 2022-07-18 (×35): 25 mg via ORAL
  Filled 2022-06-12 (×36): qty 1

## 2022-06-12 MED ORDER — GABAPENTIN 600 MG PO TABS
600.0000 mg | ORAL_TABLET | Freq: Two times a day (BID) | ORAL | Status: DC
  Administered 2022-06-12 – 2022-07-18 (×71): 600 mg via ORAL
  Filled 2022-06-12 (×71): qty 1

## 2022-06-12 MED ORDER — LORATADINE 10 MG PO TABS
10.0000 mg | ORAL_TABLET | Freq: Every day | ORAL | Status: DC
  Administered 2022-06-13 – 2022-07-18 (×36): 10 mg via ORAL
  Filled 2022-06-12 (×36): qty 1

## 2022-06-12 MED ORDER — HEPARIN (PORCINE) 25000 UT/250ML-% IV SOLN
500.0000 [IU]/h | INTRAVENOUS | Status: DC
  Administered 2022-06-12: 500 [IU]/h via INTRAVENOUS
  Filled 2022-06-12: qty 250

## 2022-06-12 MED ORDER — SERTRALINE HCL 25 MG PO TABS
50.0000 mg | ORAL_TABLET | Freq: Every day | ORAL | Status: DC
  Administered 2022-06-13 – 2022-07-18 (×36): 50 mg via ORAL
  Filled 2022-06-12 (×37): qty 2

## 2022-06-12 MED ORDER — ONDANSETRON HCL 4 MG/2ML IJ SOLN
4.0000 mg | Freq: Four times a day (QID) | INTRAMUSCULAR | Status: DC | PRN
  Filled 2022-06-12: qty 2

## 2022-06-12 MED ORDER — TRAZODONE HCL 50 MG PO TABS
150.0000 mg | ORAL_TABLET | Freq: Every day | ORAL | Status: DC
  Administered 2022-06-12 – 2022-07-17 (×36): 150 mg via ORAL
  Filled 2022-06-12 (×37): qty 3

## 2022-06-12 MED ORDER — PANTOPRAZOLE SODIUM 40 MG PO TBEC
40.0000 mg | DELAYED_RELEASE_TABLET | Freq: Every day | ORAL | Status: DC
  Administered 2022-06-13 – 2022-07-18 (×36): 40 mg via ORAL
  Filled 2022-06-12 (×35): qty 1

## 2022-06-12 MED ORDER — ALBUTEROL SULFATE (2.5 MG/3ML) 0.083% IN NEBU
3.0000 mL | INHALATION_SOLUTION | Freq: Four times a day (QID) | RESPIRATORY_TRACT | Status: DC | PRN
  Administered 2022-06-14 – 2022-07-06 (×4): 3 mL via RESPIRATORY_TRACT
  Filled 2022-06-12 (×4): qty 3

## 2022-06-12 MED ORDER — DOCUSATE SODIUM 100 MG PO CAPS
200.0000 mg | ORAL_CAPSULE | Freq: Two times a day (BID) | ORAL | Status: DC
  Administered 2022-06-12 – 2022-07-18 (×65): 200 mg via ORAL
  Filled 2022-06-12 (×67): qty 2

## 2022-06-12 MED ORDER — ROSUVASTATIN CALCIUM 20 MG PO TABS: 20.0000 mg | ORAL_TABLET | Freq: Every day | ORAL | Status: DC

## 2022-06-12 MED ORDER — ALLOPURINOL 100 MG PO TABS
100.0000 mg | ORAL_TABLET | Freq: Every day | ORAL | Status: DC
  Administered 2022-06-13 – 2022-07-18 (×36): 100 mg via ORAL
  Filled 2022-06-12 (×36): qty 1

## 2022-06-12 MED ORDER — INSULIN ASPART 100 UNIT/ML IJ SOLN
0.0000 [IU] | Freq: Every day | INTRAMUSCULAR | Status: DC
  Administered 2022-06-14 – 2022-06-15 (×2): 2 [IU] via SUBCUTANEOUS
  Administered 2022-06-17 – 2022-06-19 (×2): 3 [IU] via SUBCUTANEOUS
  Administered 2022-06-20 – 2022-06-26 (×5): 2 [IU] via SUBCUTANEOUS
  Administered 2022-07-01: 3 [IU] via SUBCUTANEOUS
  Administered 2022-07-02 – 2022-07-03 (×2): 2 [IU] via SUBCUTANEOUS
  Administered 2022-07-04: 3 [IU] via SUBCUTANEOUS
  Administered 2022-07-13 – 2022-07-16 (×2): 2 [IU] via SUBCUTANEOUS

## 2022-06-12 MED ORDER — INSULIN ASPART 100 UNIT/ML IJ SOLN
0.0000 [IU] | Freq: Three times a day (TID) | INTRAMUSCULAR | Status: DC
  Administered 2022-06-13: 2 [IU] via SUBCUTANEOUS
  Administered 2022-06-13: 3 [IU] via SUBCUTANEOUS
  Administered 2022-06-13: 8 [IU] via SUBCUTANEOUS
  Administered 2022-06-14: 11 [IU] via SUBCUTANEOUS
  Administered 2022-06-14 – 2022-06-15 (×4): 3 [IU] via SUBCUTANEOUS
  Administered 2022-06-15: 11 [IU] via SUBCUTANEOUS
  Administered 2022-06-16: 3 [IU] via SUBCUTANEOUS
  Administered 2022-06-16: 5 [IU] via SUBCUTANEOUS
  Administered 2022-06-16: 8 [IU] via SUBCUTANEOUS
  Administered 2022-06-17 (×2): 5 [IU] via SUBCUTANEOUS
  Administered 2022-06-17: 3 [IU] via SUBCUTANEOUS
  Administered 2022-06-18: 11 [IU] via SUBCUTANEOUS
  Administered 2022-06-18: 5 [IU] via SUBCUTANEOUS
  Administered 2022-06-18: 3 [IU] via SUBCUTANEOUS
  Administered 2022-06-19: 5 [IU] via SUBCUTANEOUS
  Administered 2022-06-20: 11 [IU] via SUBCUTANEOUS
  Administered 2022-06-20: 2 [IU] via SUBCUTANEOUS
  Administered 2022-06-21 (×2): 3 [IU] via SUBCUTANEOUS
  Administered 2022-06-21: 2 [IU] via SUBCUTANEOUS
  Administered 2022-06-22: 3 [IU] via SUBCUTANEOUS
  Administered 2022-06-22: 2 [IU] via SUBCUTANEOUS
  Administered 2022-06-22: 11 [IU] via SUBCUTANEOUS
  Administered 2022-06-23: 3 [IU] via SUBCUTANEOUS
  Administered 2022-06-23 (×2): 2 [IU] via SUBCUTANEOUS
  Administered 2022-06-24: 5 [IU] via SUBCUTANEOUS
  Administered 2022-06-24 – 2022-06-25 (×3): 3 [IU] via SUBCUTANEOUS
  Administered 2022-06-25: 5 [IU] via SUBCUTANEOUS
  Administered 2022-06-25: 8 [IU] via SUBCUTANEOUS
  Administered 2022-06-26: 3 [IU] via SUBCUTANEOUS
  Administered 2022-06-26: 5 [IU] via SUBCUTANEOUS
  Administered 2022-06-26: 2 [IU] via SUBCUTANEOUS
  Administered 2022-06-27: 8 [IU] via SUBCUTANEOUS
  Administered 2022-06-27: 3 [IU] via SUBCUTANEOUS
  Administered 2022-06-28: 8 [IU] via SUBCUTANEOUS
  Administered 2022-06-28: 3 [IU] via SUBCUTANEOUS
  Administered 2022-06-28: 2 [IU] via SUBCUTANEOUS
  Administered 2022-06-29: 8 [IU] via SUBCUTANEOUS
  Administered 2022-06-29: 11 [IU] via SUBCUTANEOUS
  Administered 2022-06-30: 2 [IU] via SUBCUTANEOUS
  Administered 2022-06-30: 8 [IU] via SUBCUTANEOUS
  Administered 2022-06-30: 3 [IU] via SUBCUTANEOUS
  Administered 2022-07-01: 2 [IU] via SUBCUTANEOUS
  Administered 2022-07-01: 3 [IU] via SUBCUTANEOUS
  Administered 2022-07-02: 5 [IU] via SUBCUTANEOUS
  Administered 2022-07-02 (×2): 3 [IU] via SUBCUTANEOUS
  Administered 2022-07-03: 8 [IU] via SUBCUTANEOUS
  Administered 2022-07-03 – 2022-07-04 (×3): 3 [IU] via SUBCUTANEOUS
  Administered 2022-07-04: 5 [IU] via SUBCUTANEOUS
  Administered 2022-07-04: 3 [IU] via SUBCUTANEOUS
  Administered 2022-07-05: 8 [IU] via SUBCUTANEOUS
  Administered 2022-07-05: 5 [IU] via SUBCUTANEOUS
  Administered 2022-07-06 (×2): 2 [IU] via SUBCUTANEOUS
  Administered 2022-07-07: 3 [IU] via SUBCUTANEOUS
  Administered 2022-07-07 – 2022-07-08 (×2): 11 [IU] via SUBCUTANEOUS
  Administered 2022-07-09: 5 [IU] via SUBCUTANEOUS
  Administered 2022-07-09: 3 [IU] via SUBCUTANEOUS
  Administered 2022-07-10: 5 [IU] via SUBCUTANEOUS
  Administered 2022-07-10: 3 [IU] via SUBCUTANEOUS
  Administered 2022-07-11: 2 [IU] via SUBCUTANEOUS
  Administered 2022-07-11 – 2022-07-12 (×3): 3 [IU] via SUBCUTANEOUS
  Administered 2022-07-13: 5 [IU] via SUBCUTANEOUS
  Administered 2022-07-14 (×2): 2 [IU] via SUBCUTANEOUS
  Administered 2022-07-16 (×2): 3 [IU] via SUBCUTANEOUS
  Administered 2022-07-17 (×2): 2 [IU] via SUBCUTANEOUS

## 2022-06-12 MED ORDER — TAMSULOSIN HCL 0.4 MG PO CAPS
0.4000 mg | ORAL_CAPSULE | Freq: Every day | ORAL | Status: DC
  Administered 2022-06-13 – 2022-07-18 (×36): 0.4 mg via ORAL
  Filled 2022-06-12 (×36): qty 1

## 2022-06-12 MED ORDER — SODIUM CHLORIDE 0.9 % IV SOLN
650.0000 mg | Freq: Every day | INTRAVENOUS | Status: DC
  Administered 2022-06-12 – 2022-06-13 (×2): 650 mg via INTRAVENOUS
  Filled 2022-06-12 (×3): qty 13

## 2022-06-12 MED ORDER — HYDRALAZINE HCL 25 MG PO TABS
25.0000 mg | ORAL_TABLET | Freq: Three times a day (TID) | ORAL | Status: DC
  Administered 2022-06-12 – 2022-06-25 (×37): 25 mg via ORAL
  Filled 2022-06-12 (×37): qty 1

## 2022-06-12 MED ORDER — MUPIROCIN 2 % EX OINT
1.0000 | TOPICAL_OINTMENT | Freq: Two times a day (BID) | CUTANEOUS | Status: AC
  Administered 2022-06-12 – 2022-06-16 (×9): 1 via NASAL
  Filled 2022-06-12: qty 22

## 2022-06-12 MED ORDER — ACETAMINOPHEN 325 MG PO TABS
650.0000 mg | ORAL_TABLET | ORAL | Status: DC | PRN
  Administered 2022-07-14 (×2): 650 mg via ORAL
  Filled 2022-06-12 (×2): qty 2

## 2022-06-12 MED ORDER — GUAIFENESIN ER 600 MG PO TB12
600.0000 mg | ORAL_TABLET | Freq: Two times a day (BID) | ORAL | Status: DC
  Administered 2022-06-12 – 2022-07-18 (×70): 600 mg via ORAL
  Filled 2022-06-12 (×71): qty 1

## 2022-06-12 NOTE — Progress Notes (Addendum)
Procedure(s) (LRB): DEBRIDEMENT OF VAD POWERCORD TUNNEL (N/A) APPLICATION OF WOUND VAC (N/A) Subjective: Patient admitted today in preparation for debridement of VAD drive line tunnel infection Fri am 7-7. The wound now tunnels to the midline and needs to be opened with excisional debridement and probable VAC placement INR on admission 1.7- will start iv heparin bridge until procedure.  Labs are satisfactory and iv daptomycin is being given daily.  Objective: Vital signs in last 24 hours: Temp:  [98.4 F (36.9 C)] 98.4 F (36.9 C) (07/05 1217) Pulse Rate:  [89] 89 (07/05 1224) Resp:  [16] 16 (07/05 1217) BP: (113)/(86) 113/86 (07/05 1217) SpO2:  [98 %] 98 % (07/05 1217) Weight:  [80.1 kg] 80.1 kg (07/05 1149)  Hemodynamic parameters for last 24 hours:  stable  Intake/Output from previous day: No intake/output data recorded. Intake/Output this shift: Total I/O In: 120 [P.O.:120] Out: -   EXAM Nsr DL exit site dressing in place Normal VAD hum  Lab Results: Recent Labs    06/12/22 1230  WBC 10.5  HGB 9.5*  HCT 28.6*  PLT 385   BMET:  Recent Labs    06/12/22 1230  NA 135  K 3.9  CL 100  CO2 26  GLUCOSE 153*  BUN 24*  CREATININE 1.75*  CALCIUM 8.4*    PT/INR:  Recent Labs    06/12/22 1230  LABPROT 20.0*  INR 1.7*   ABG    Component Value Date/Time   PHART 7.508 (H) 11/20/2020 0438   HCO3 24.8 09/09/2021 0313   TCO2 29 11/20/2020 0438   ACIDBASEDEF 6.0 (H) 11/24/2019 1718   O2SAT 63.4 09/09/2021 0313   CBG (last 3)  Recent Labs    06/12/22 1224  GLUCAP 138*    Assessment/Plan: S/P Procedure(s) (LRB): DEBRIDEMENT OF VAD POWERCORD TUNNEL (N/A) APPLICATION OF WOUND VAC (N/A) Plan OR Fri am - DL tunnel debridement under Gen anesthesia Stop iv heparin on call to OR  Daily wet/dry dressing change by unit RN using sterile technique   LOS: 0 days    Lovett Sox 06/12/2022

## 2022-06-12 NOTE — Progress Notes (Signed)
EKG CRITICAL VALUE     12 lead EKG performed.  Critical value noted.  Felicity Pellegrini, notified.   Edmonia Caprio, CCT 06/12/2022 12:24 PM
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
## 2022-06-12 NOTE — Progress Notes (Signed)
ANTICOAGULATION CONSULT NOTE  Pharmacy Consult for heparin Indication:  LVAD  No Known Allergies  Patient Measurements: Height: 5\' 10"  (177.8 cm) Weight: 80.1 kg (176 lb 9.4 oz) IBW/kg (Calculated) : 73 Heparin Dosing Weight: 80kg  Vital Signs: Temp: 98.4 F (36.9 C) (07/05 1217) Temp Source: Oral (07/05 1217) BP: 113/86 (07/05 1217) Pulse Rate: 89 (07/05 1224)  Labs: Recent Labs    06/12/22 1230  HGB 9.5*  HCT 28.6*  PLT 385  LABPROT 20.0*  INR 1.7*  CREATININE 1.75*    Estimated Creatinine Clearance: 45.8 mL/min (A) (by C-G formula based on SCr of 1.75 mg/dL (H)).   Medical History: Past Medical History:  Diagnosis Date   "    Arthritis    CAD (coronary artery disease)    a. s/p CABG in 11/2019 with LIMA-LAD, SVG-OM1, SVG-PDA and SVG-D1   CHF (congestive heart failure) (HCC)    a. EF < 20% by echo in 11/2019   Essential hypertension    PAF (paroxysmal atrial fibrillation) (HCC)    Type 2 diabetes mellitus (HCC)     Assessment: 2 yoM with HM3 LVAD admitted with ongoing LVAD driveline infection. Pt with planned I&D in OR 7/7. Pt is on warfarin PTA with last dose taken 7/3. INR on admission is subtherapeutic at 1.7 - will cover with IV heparin for now. CBC and LDH stable.  Goal of Therapy:  Heparin level ~0.3 units/ml Monitor platelets by anticoagulation protocol: Yes   Plan:  Hold warfarin for debridement Heparin 500 units/h no bolus - no titrations for now Will check heparin level in 6h to ensure it is not elevated  9/7, PharmD, BCPS, [facility] Clinical Pharmacist [phone] Please check AMION for all [facility] Pharmacy numbers 06/12/2022
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
## 2022-06-12 NOTE — Progress Notes (Addendum)
Pharmacy Antibiotic Note  Kevin Underwood is a 61 y.o. male with HM3 LVAD admitted 06/12/22 with need for LVAD driveline infection debridement. Pt is on daptomycin PTA for recurrent infection. MRSA grew on wound culture 6/30. OR for debridement planned 7/7. Last CK 6/28 was 92. Per pt he has not had his daptomycin dose yet today.  Plan: Daptomycin 650mg  IV q24h CK in am  Height: 5\' 10"  (177.8 cm) Weight: 80.1 kg (176 lb 9.4 oz) IBW/kg (Calculated) : 73  No data recorded.  No results for input(s): "WBC", "CREATININE", "LATICACIDVEN", "VANCOTROUGH", "VANCOPEAK", "VANCORANDOM", "GENTTROUGH", "GENTPEAK", "GENTRANDOM", "TOBRATROUGH", "TOBRAPEAK", "TOBRARND", "AMIKACINPEAK", "AMIKACINTROU", "AMIKACIN" in the last 168 hours.  Estimated Creatinine Clearance: 39.1 mL/min (A) (by C-G formula based on SCr of 2.05 mg/dL (H)).    No Known Allergies   , PharmD, BCPS, [facility] Clinical Pharmacist [phone] Please check AMION for all [facility] numbers 06/12/2022

## 2022-06-12 NOTE — Plan of Care (Signed)
Pt admitted, care plan started

## 2022-06-13 ENCOUNTER — Other Ambulatory Visit (HOSPITAL_COMMUNITY): Payer: Self-pay | Admitting: Cardiology

## 2022-06-13 ENCOUNTER — Encounter (HOSPITAL_COMMUNITY): Payer: 59

## 2022-06-13 ENCOUNTER — Inpatient Hospital Stay (HOSPITAL_COMMUNITY): Payer: 59

## 2022-06-13 DIAGNOSIS — E119 Type 2 diabetes mellitus without complications: Secondary | ICD-10-CM

## 2022-06-13 DIAGNOSIS — T827XXA Infection and inflammatory reaction due to other cardiac and vascular devices, implants and grafts, initial encounter: Secondary | ICD-10-CM | POA: Diagnosis not present

## 2022-06-13 DIAGNOSIS — Z95811 Presence of heart assist device: Secondary | ICD-10-CM

## 2022-06-13 LAB — URINALYSIS, ROUTINE W REFLEX MICROSCOPIC
Bacteria, UA: NONE SEEN
Bilirubin Urine: NEGATIVE
Glucose, UA: NEGATIVE mg/dL
Ketones, ur: NEGATIVE mg/dL
Leukocytes,Ua: NEGATIVE
Nitrite: NEGATIVE
Protein, ur: NEGATIVE mg/dL
Specific Gravity, Urine: 1.009 (ref 1.005–1.030)
pH: 6 (ref 5.0–8.0)

## 2022-06-13 LAB — BASIC METABOLIC PANEL
Anion gap: 13 (ref 5–15)
BUN: 29 mg/dL — ABNORMAL HIGH (ref 8–23)
CO2: 26 mmol/L (ref 22–32)
Calcium: 8.9 mg/dL (ref 8.9–10.3)
Chloride: 98 mmol/L (ref 98–111)
Creatinine, Ser: 1.79 mg/dL — ABNORMAL HIGH (ref 0.61–1.24)
GFR, Estimated: 43 mL/min — ABNORMAL LOW (ref >60)
Glucose, Bld: 163 mg/dL — ABNORMAL HIGH (ref 70–99)
Potassium: 4.2 mmol/L (ref 3.5–5.1)
Sodium: 137 mmol/L (ref 135–145)

## 2022-06-13 LAB — CBC
HCT: 27.3 % — ABNORMAL LOW (ref 39.0–52.0)
Hemoglobin: 8.9 g/dL — ABNORMAL LOW (ref 13.0–17.0)
MCH: 26.4 pg (ref 26.0–34.0)
MCHC: 32.6 g/dL (ref 30.0–36.0)
MCV: 81 fL (ref 80.0–100.0)
Platelets: 317 10*3/uL (ref 150–400)
RBC: 3.37 MIL/uL — ABNORMAL LOW (ref 4.22–5.81)
RDW: 15.9 % — ABNORMAL HIGH (ref 11.5–15.5)
WBC: 9.4 10*3/uL (ref 4.0–10.5)
nRBC: 0 % (ref 0.0–0.2)

## 2022-06-13 LAB — GLUCOSE, CAPILLARY
Glucose-Capillary: 150 mg/dL — ABNORMAL HIGH (ref 70–99)
Glucose-Capillary: 293 mg/dL — ABNORMAL HIGH (ref 70–99)
Glucose-Capillary: 168 mg/dL — ABNORMAL HIGH (ref 70–99)
Glucose-Capillary: 188 mg/dL — ABNORMAL HIGH (ref 70–99)

## 2022-06-13 LAB — PROTIME-INR
INR: 1.3 — ABNORMAL HIGH (ref 0.8–1.2)
Prothrombin Time: 16.4 seconds — ABNORMAL HIGH (ref 11.4–15.2)

## 2022-06-13 LAB — PREPARE RBC (CROSSMATCH)

## 2022-06-13 LAB — CK: Total CK: 897 U/L — ABNORMAL HIGH (ref 49–397)

## 2022-06-13 LAB — LACTATE DEHYDROGENASE: LDH: 249 U/L — ABNORMAL HIGH (ref 98–192)

## 2022-06-13 LAB — HEPARIN LEVEL (UNFRACTIONATED): Heparin Unfractionated: <0.1 IU/mL — ABNORMAL LOW (ref 0.30–0.70)

## 2022-06-13 MED ORDER — CHLORHEXIDINE GLUCONATE CLOTH 2 % EX PADS
6.0000 | MEDICATED_PAD | Freq: Every day | CUTANEOUS | Status: DC
Start: 2022-06-13 — End: 2022-07-18
  Administered 2022-06-13 – 2022-07-18 (×34): 6 via TOPICAL

## 2022-06-13 MED ORDER — IOHEXOL 9 MG/ML PO SOLN
ORAL | Status: AC
  Filled 2022-06-13: qty 1000

## 2022-06-13 MED ORDER — CEFAZOLIN SODIUM-DEXTROSE 2-4 GM/100ML-% IV SOLN
2.0000 g | INTRAVENOUS | Status: AC
  Administered 2022-06-14: 2 g via INTRAVENOUS
  Filled 2022-06-13: qty 100

## 2022-06-13 MED ORDER — CEFAZOLIN SODIUM-DEXTROSE 2-4 GM/100ML-% IV SOLN
2.0000 g | INTRAVENOUS | Status: DC
  Filled 2022-06-13: qty 100

## 2022-06-13 NOTE — Progress Notes (Signed)
LVAD Coordinator Rounding Note:  Admitted 06/12/22 to Dr. Alford Highland service due to drive line debridement scheduled with Dr Donata Clay 06/14/22.   HM III LVAD implanted on 11/17/20 by Dr. Vickey Sages under Destination Therapy criteria due to social issues and diabetes with HgbA1c 10.4.   Pt sitting in recliner on my arrival. Tracey at bedside. Pt denies complaints this morning.   Currently on IV Daptomycin. Per Dr Thedore Mins ID note anticipate additional 4 weeks of IV antibiotics after debridement. Will need suppressive long term antibiotics after IV therapy completed.   Plan for drive line debridement with possible wound vac placement in the OR tomorrow morning with Dr Donata Clay.    Vital signs: Temp:  98.2 HR: 76 Doppler Pressure: 92 Automatic BP:  105/90 (96) O2 Sat: 100% on RA Wt: 171.7 lbs   LVAD interrogation reveals:  Speed: 5500 Flow:  4.2 Power:  3.9 w PI:  4.2  Alarms: none Events:  48 PI events so far today  Fixed speed:  5500 Low speed limit:  5200  Drive Line:  Existing VAD dressing removed and site care performed using sterile technique. Drive line exit site cleaned with Chlora prep applicators x 2, allowed to dry, packed with 1 saline moistened 4 x 4 and covered with several dry 4 x 4. Large amount of thick yellow/green/bloody drainage noted in wound bed and gauze; debrided drainage from wound bed with 2 x 2. Site tunnels at least 6 cm. No redness, tenderness, or rash noted. Mild foul odor noted. Drive line anchor reapplied. Daily dressing changes per VAD coordinator. Next dressing change due 06/14/22 in OR per Dr Donata Clay.     Labs:  LDH trend: 249  INR trend: 1.3   CK: 897  Anticoagulation Plan: -INR Goal: 2.0 - 2.5 -ASA Dose: none  ICD: N/A  Drips:  Heparin 500 units/hr  Infection:  06/07/22>> repeat drive line culture>> abundant MRSA; final  Plan/Recommendations:  Page VAD coordinators for equipment or drive line issues Daily sterile drive line dressing  change. Plan for drive line debridement in OR per Dr Donata Clay tomorrow. VAD coordinator will accompany pt.   Alyce Pagan RN VAD Coordinator  Office: [phone]  24/7 Pager: [phone]

## 2022-06-13 NOTE — Plan of Care (Signed)
  Problem: Education: Goal: Knowledge of General Education information will improve Description: Including pain rating scale, medication(s)/side effects and non-pharmacologic comfort measures Outcome: Progressing   Problem: Health Behavior/Discharge Planning: Goal: Ability to manage health-related needs will improve Outcome: Progressing   Problem: Activity: Goal: Risk for activity intolerance will decrease Outcome: Progressing   Problem: Coping: Goal: Level of anxiety will decrease Outcome: Progressing   Problem: Elimination: Goal: Will not experience complications related to bowel motility Outcome: Progressing Goal: Will not experience complications related to urinary retention Outcome: Progressing   Problem: Pain Managment: Goal: General experience of comfort will improve Outcome: Progressing   Problem: Safety: Goal: Ability to remain free from injury will improve Outcome: Progressing   Problem: Skin Integrity: Goal: Risk for impaired skin integrity will decrease Outcome: Progressing   

## 2022-06-13 NOTE — TOC Initial Note (Signed)
Transition of Care [name]) - Initial/Assessment Note    Patient Details  Name: Kevin Underwood MRN: [id number] Date of Birth: September 01, 1961  Transition of Care [name]) CM/SW Contact:    [name], RN Phone Number: [phone] 06/13/2022, 3:38 PM  Clinical Narrative:                 HF TOC CM faxed referral to [name] for wound vac. Contacted [name] rep, [name] and [name] rep, [name]. Pt is active with HH for IV abx at home. Will need OPAT orders for IV abx. Will need HH orders with [facility] for [facility] (IV abx only)  Expected Discharge Plan: Home w Home Health Services Barriers to Discharge: Continued Medical Work up   Patient Goals and CMS Choice Patient states their goals for this hospitalization and ongoing recovery are:: get better CMS Medicare.gov Compare Post Acute Care list provided to:: Patient Choice offered to / list presented to : Patient  Expected Discharge Plan and Services Expected Discharge Plan: Home w Home Health Services   Discharge Planning Services: CM Consult Post Acute Care Choice: Home Health Living arrangements for the past 2 months: Single Family Home  HH Arranged: RN HH Agency: Therapist, [facility]) Date [facility] Agency Contacted: 06/13/22 Time HH Agency Contacted: [phone] Representative spoke with at [facility] Agency: [name]  Prior Living Arrangements/Services Living arrangements for the past 2 months: Single Family Home Lives with:: Spouse Patient language and need for interpreter reviewed:: Yes Do you feel safe going back to the place where you live?: Yes      Need for Family Participation in Patient Care: Yes (Comment) Care giver support system in place?: Yes (comment) Current home services:  (LVAD, nebulizer machine)    Activities of Daily Living Home Assistive Devices/Equipment: Blood pressure cuff, Other (Comment) (LVAD equipement) ADL Screening (condition at time of admission) Patient's cognitive ability adequate to safely  complete daily activities?: Yes Is the patient deaf or have difficulty hearing?: No Does the patient have difficulty seeing, even when wearing glasses/contacts?: No Does the patient have difficulty concentrating, remembering, or making decisions?: No Patient able to express need for assistance with ADLs?: No Does the patient have difficulty dressing or bathing?: No Independently performs ADLs?: Yes (appropriate for developmental age) Does the patient have difficulty walking or climbing stairs?: No Weakness of Legs: None Weakness of Arms/Hands: None  Permission Sought/Granted Permission sought to share information with : Case Manager, Family Supports, PCP Permission granted to share information with : Yes, Verbal Permission Granted  Share Information with NAME: [name]  Permission granted to share info w AGENCY: Home Health, DME  Permission granted to share info w Relationship: wife  Permission granted to share info w Contact Information: [phone]  Emotional Assessment Appearance:: Appears stated age Attitude/Demeanor/Rapport: Engaged Affect (typically observed): Accepting Orientation: : Oriented to Self, Oriented to Place, Oriented to  Time, Oriented to Situation   Psych Involvement: No (comment)  Admission diagnosis:  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 (LVAD) (HCC) [R15.7XXA] Patient Active Problem List   Diagnosis Date Noted   MRSA infection    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 (LVAD) (HCC)    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) 05/13/2022   AKI (acute kidney injury) (HCC) 05/13/2022   Hyperthyroidism 01/22/2022   Chronic systolic heart failure (HCC)    COPD (chronic obstructive pulmonary disease) (HCC) 09/09/2021   Malnutrition of moderate degree 11/17/2020  CHF (congestive heart failure), NYHA class IV, chronic, systolic (HCC) 11/17/2020   Palliative care by  specialist    Goals of care, counseling/discussion    Hyperlipidemia    CHF exacerbation (HCC) 09/23/2020   Unstable angina (HCC) 09/22/2020   LVAD (left ventricular assist device) present (HCC)    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   Cardiogenic shock (HCC)    Acute respiratory failure (HCC)    RUQ abdominal pain    Cardiomyopathy (HCC) 11/21/2019   Acute systolic CHF (congestive heart failure) (HCC) 11/21/2019   History of alcohol abuse Quit 6 mos ago 11/21/2019   History of recreational drug use 11/21/2019   PAF (paroxysmal atrial fibrillation) (HCC) 11/21/2019   Cardiac volume overload 11/21/2019   Elevated brain natriuretic peptide (BNP) level 11/21/2019   Leg edema, right 11/21/2019   Hyponatremia 11/21/2019   Abnormal electrocardiogram (ECG) (EKG) 11/21/2019   Hyperglycemia 11/21/2019   Tremulousness 11/21/2019   Restlessness and agitation 11/21/2019   OSA (obstructive sleep apnea) 11/21/2019   Atrial fibrillation with RVR (HCC) 11/20/2019   Acute congestive heart failure (HCC) 11/20/2019   Acute respiratory distress 11/20/2019   COPD with acute exacerbation (HCC)    Hypertension    Type 2 diabetes mellitus (HCC)    Closed fracture of 5th metacarpal 08/31/2012   PCP:  [name], FNP Pharmacy:   [facility] [phone] - [place],  - [address] AT [facility] [address] [place] [address] Phone: [phone] Fax: [phone]     Social Determinants of Health (SDOH) Interventions    Readmission Risk Interventions    11/28/2020   11:57 AM  Readmission Risk Prevention Plan  Transportation Screening Complete  Medication Review (RN Care Manager) Complete  HRI or Home Care Consult Complete  SW Recovery Care/Counseling Consult Complete  Palliative Care Screening Not Applicable  Skilled Nursing Facility Not Applicable

## 2022-06-13 NOTE — Plan of Care (Signed)
Discussed with patient plan of care for the evening, pain management and medications with some teach back displayed.  Patient discussed surgery tomorrow.  Problem: Education: Goal: Knowledge of General Education information will improve Description: Including pain rating scale, medication(s)/side effects and non-pharmacologic comfort measures Outcome: Progressing

## 2022-06-13 NOTE — Progress Notes (Addendum)
Advanced Heart Failure VAD Team Note  PCP-Cardiologist: Nona Dell, MD   Subjective:    Warfarin held in anticipation of debridement of VAD driveline tunnel infection on 07/07. INR 1.3. Continues on heparin gtt.   On daily IV daptomycin. ID has been consulted. He is afebrile, no leukocytosis.  CK 897 (92 a week ago). Also on rosuvastatin.   MAPs 90s  Scr down to 1.79  Feels good. No complaints this am.  LVAD INTERROGATION:  HeartMate III LVAD:   Flow 4.1 liters/min, speed 5500, power 4.0, PI 3.8.  36 PI events.  Objective:    Vital Signs:   Temp:  [98 F (36.7 C)-98.6 F (37 C)] 98.1 F (36.7 C) (07/06 0304) Pulse Rate:  [72-90] 88 (07/06 0304) Resp:  [15-20] 16 (07/06 0304) BP: (104-129)/(86-101) 112/87 (07/06 0304) SpO2:  [93 %-98 %] 94 % (07/06 0304) Weight:  [77.9 kg-80.1 kg] 77.9 kg (07/06 0451)   Mean arterial Pressure 90s  Intake/Output:   Intake/Output Summary (Last 24 hours) at 06/13/2022 0704 Last data filed at 06/13/2022 0304 Gross per 24 hour  Intake 825.66 ml  Output 800 ml  Net 25.66 ml     Physical Exam    General:  Well appearing. No resp difficulty HEENT: normal Neck: supple. JVP ~ 8 cm. Carotids 2+ bilat; no bruits. No lymphadenopathy or thyromegaly appreciated. Cor: Mechanical heart sounds with LVAD hum present. Lungs: clear Abdomen: soft, nontender, nondistended. bowel sounds. Driveline: Dressing over driveline site. securement device intact and driveline incorporated Extremities: no cyanosis, clubbing, rash, edema Neuro: alert & orientedx3, cranial nerves grossly intact. moves all 4 extremities w/o difficulty. Affect pleasant   Telemetry   SR 80s (personally reviewed)  Labs   Basic Metabolic Panel: Recent Labs  Lab 06/12/22 1230  NA 135  K 3.9  CL 100  CO2 26  GLUCOSE 153*  BUN 24*  CREATININE 1.75*  CALCIUM 8.4*    Liver Function Tests: Recent Labs  Lab 06/12/22 1230  AST 68*  ALT 86*  ALKPHOS 114  BILITOT  0.2*  PROT 6.9  ALBUMIN 2.7*   No results for input(s): "LIPASE", "AMYLASE" in the last 168 hours. No results for input(s): "AMMONIA" in the last 168 hours.  CBC: Recent Labs  Lab 06/12/22 1230 06/13/22 0603  WBC 10.5 9.4  NEUTROABS 8.0*  --   HGB 9.5* 8.9*  HCT 28.6* 27.3*  MCV 82.2 81.0  PLT 385 317    INR: Recent Labs  Lab 06/07/22 1224 06/12/22 1230 06/13/22 0603  INR 2.4* 1.7* 1.3*    Other results: EKG:    Imaging   No results found.   Medications:     Scheduled Medications:  allopurinol  100 mg Oral Daily   docusate sodium  200 mg Oral BID   gabapentin  600 mg Oral BID   guaiFENesin  600 mg Oral BID   hydrALAZINE  25 mg Oral TID   insulin aspart  0-15 Units Subcutaneous TID WC   insulin aspart  0-5 Units Subcutaneous QHS   loratadine  10 mg Oral Daily   methimazole  20 mg Oral BID   metoprolol succinate  25 mg Oral Daily   mupirocin ointment  1 Application Nasal BID   pantoprazole  40 mg Oral Daily   rosuvastatin  20 mg Oral Daily   sertraline  50 mg Oral Daily   tamsulosin  0.4 mg Oral Daily   traZODone  150 mg Oral QHS    Infusions:  DAPTOmycin (  CUBICIN) 650 mg in sodium chloride 0.9 % IVPB 650 mg (06/12/22 1430)   heparin 500 Units/hr (06/13/22 0010)    PRN Medications: acetaminophen, albuterol, ondansetron (ZOFRAN) IV   Patient Profile   61 y.o. male with history of HM III LVAD, chronic systolic CHF/ICM, COPD, PAF. 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   Readmitted for driveline infection and surgical debridement/possible wound vac placement.  Assessment/Plan:    1. Driveline infection: Recent driveline infection [id number]. S/p driveline site debridement 6/8. Surgical wound cultures grew MRSA, repeat debridement 6/23.   - Seen in VAD clinic 06/30 and noted to have increased wound drainage and tunneling of wound.  Planning for surgical debridement in OR with Dr. Darcey Nora on 07/07. Last dose  of Warfarin on 07/02. INR 1.3 today. Bridging with heparin gtt. - Wound culture 06/30 again grew MRSA. Had nausea and AKI while on vancomycin. Currently on IV daptomycin. CK 92 about a week ago, up to 897 this am. Recheck CK tomorrow am. Stop statin therapy. - ID consulted 3.  Chronic systolic CHF: Echo [id number] with EF 20-25%, mildly decreased RV function. LHC/RHC in 12/21 with patent grafts, low output. Suspect mixed ischemic/nonischemic cardiomyopathy (prior heavy ETOH and drugs as well as CAD).  No ETOH, drugs, smoking since CABG in 12/20. Admitted with cardiogenic shock in 12/21, had placement of Impella 5.5 initially, now s/p Heartmate 3 LVAD on 11/17/20.  Ramp echo 2/23 with speed decreased to 5500 rpm. He would eventually like heart transplant. NYHA class II. Have been allowing MAP to run higher due to recent AKI thought to be due to lowering BP too much in setting of right renal artery stenosis.  - MAPs currently 90s - Continue hydralazine 25 mg TID - Continue Toprol XL 25 mg daily.  - No diuretic. Does not appear volume overloaded.  - INR 1.3. Coumadin on hold. Bridging with heparin gtt. - LDH 244>249 4. AKI: Creatinine up to 4.6 last admission, initially thought this was ATN from low BP but he was never markedly hypotensive and LFTs without any evidence of shock liver. ? Vanc toxicity (troughs were ok). Taken off Entresto and hydralazine. Volume repleted with IVF. In reviewing prior CTA abdomen/pelvis, he has high-grade stenosis of proximal right renal artery. D/w Renal and feel like he may need higher perfusion pressures.  - Would consider reassessment of renal artery stenosis and possible treatment once creatinine has improved to baseline.  - Scr down to 1.79 - Watch MAPs, avoid hypotension 5. CAD: S/p CABG 12/20.  LHC pre-VAD with patent grafts, no target for intervention. - Stop statin d/t elevated CK. 6. Atrial fibrillation: Paroxysmal.  S/p Maze and LA appendage clip with CABG in 12/20.  Maintaining NSR.  - Continue Toprol XL 25 mg daily.  - Off amiodarone with hyperthyroidism. - Holding Warfarin as above 7. Type 2 diabetes: Takes glipizide at home.  - SSI  8. 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  9. 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  He missed his endocrinology appt. Has been rescheduled for July 27th. - He is now off amiodarone.   - Continue methimazole 20 mg bid, TSH was mildly elevated.   10. Gout: On allopurinol.  11. Anemia: Hgb 7.7 during recent admit. Iron deficient, given feraheme. -Hgb up to 8.9   I reviewed the LVAD parameters from today, and compared the results to the patient's prior recorded data.  No programming changes were made.  The LVAD is functioning within specified parameters.  The patient performs LVAD self-test daily.  LVAD interrogation was negative for any significant power changes, alarms or PI events/speed drops.  LVAD equipment check completed and is in good working order.  Back-up equipment present.   LVAD education done on emergency procedures and precautions and reviewed exit site care.  Length of Stay: 1  FINCH, LINDSAY N, PA-C 06/13/2022, 7:04 AM  VAD Team --- VAD ISSUES ONLY--- Pager [phone] (7am - 7am)  Advanced Heart Failure Team  Pager [phone] (M-F; 7a - 5p)  Please contact [facility] for night-coverage after hours (5p -7a ) and weekends on [email or web address]  Patient seen with PA, agree with the above note.   No complaints this morning, now on heparin gtt with low INR.  Afebrile, WBCs normal.    CK noted to be up to 897, he has been on daptomycin and Crestor.   General: Well appearing this am. NAD.  HEENT: Normal. Neck: Supple, JVP 7-8 cm. Carotids OK.  Cardiac:  Mechanical heart sounds with LVAD hum present.  Lungs:  CTAB, normal effort.  Abdomen:  NT, ND, no HSM. No bruits or masses. +BS  LVAD exit site: Driveline site dressed but has had increased drainage and  tunneling.  Extremities:  Warm and dry. No cyanosis, clubbing, rash, or edema.  Neuro:  Alert & oriented x 3. Cranial nerves grossly intact. Moves all 4 extremities w/o difficulty. Affect pleasant    Plan to go to OR tomorrow with Dr. Donata Clay for driveline site debridement and wound vac.  ID to see, he is currently on daptomycin for MRSA driveline infection.  He had trouble with vancomycin last admission, nausea and AKI.  Now with CK up to 897.   - For now, will continue daptomycin but have stopped statin.  Repeat CK tomorrow.  Await ID guidance regarding any changes to his antibiotic regimen.   Marca Ancona 06/13/2022 9:11 AM

## 2022-06-13 NOTE — Progress Notes (Signed)
Mobility Specialist Progress Note    06/13/22 1031  Mobility  Activity Ambulated independently in hallway  Level of Assistance Modified independent, requires aide device or extra time  Assistive Device Other (Comment) (IV pole)  Distance Ambulated (ft) 420 ft  Activity Response Tolerated well  $Mobility charge 1 Mobility   Post-Mobility: 86 HR  Pt received in chair and agreeable. C/o some foot pain. Returned to chair with call bell in reach and remained on batteries.     [employer] Mobility Specialist

## 2022-06-13 NOTE — Plan of Care (Signed)
  Problem: Education: Goal: Knowledge of General Education information will improve Description: Including pain rating scale, medication(s)/side effects and non-pharmacologic comfort measures Outcome: Progressing   Problem: Health Behavior/Discharge Planning: Goal: Ability to manage health-related needs will improve Outcome: Progressing   Problem: Clinical Measurements: Goal: Respiratory complications will improve Outcome: Progressing   Problem: Activity: Goal: Risk for activity intolerance will decrease Outcome: Progressing   Problem: Nutrition: Goal: Adequate nutrition will be maintained Outcome: Progressing   Problem: Coping: Goal: Level of anxiety will decrease Outcome: Progressing   Problem: Elimination: Goal: Will not experience complications related to bowel motility Outcome: Progressing Goal: Will not experience complications related to urinary retention Outcome: Progressing   Problem: Skin Integrity: Goal: Risk for impaired skin integrity will decrease Outcome: Progressing

## 2022-06-13 NOTE — Anesthesia Preprocedure Evaluation (Addendum)
Anesthesia Evaluation  Patient identified by MRN, date of birth, ID band Patient awake    Reviewed: Allergy & Precautions, NPO status , Patient's Chart, lab work & pertinent test results, reviewed documented beta blocker date and time   Airway Mallampati: II  TM Distance: >3 FB Neck ROM: Full    Dental  (+) Edentulous Lower, Edentulous Upper   Pulmonary sleep apnea , COPD,  COPD inhaler, former smoker,    Pulmonary exam normal        Cardiovascular hypertension, Pt. on home beta blockers and Pt. on medications (-) angina+ CAD, + CABG and +CHF  Normal cardiovascular exam+ dysrhythmias Atrial Fibrillation      Neuro/Psych PSYCHIATRIC DISORDERS negative neurological ROS     GI/Hepatic negative GI ROS, Neg liver ROS,   Endo/Other  diabetes, Type 2, Oral Hypoglycemic Agents  Renal/GU Renal InsufficiencyRenal disease     Musculoskeletal  (+) Arthritis , Gout   Abdominal   Peds  Hematology  (+) Blood dyscrasia, anemia ,   Anesthesia Other Findings LVAD Drive line infection  Reproductive/Obstetrics                            Anesthesia Physical  Anesthesia Plan  ASA: 4  Anesthesia Plan: General   Post-op Pain Management: Minimal or no pain anticipated   Induction: Intravenous  PONV Risk Score and Plan: 2 and Ondansetron, Dexamethasone, Treatment may vary due to age or medical condition and Midazolam  Airway Management Planned: LMA and Oral ETT  Additional Equipment:   Intra-op Plan:   Post-operative Plan: Extubation in OR  Informed Consent: I have reviewed the patients History and Physical, chart, labs and discussed the procedure including the risks, benefits and alternatives for the proposed anesthesia with the patient or authorized representative who has indicated his/her understanding and acceptance.     Dental advisory given  Plan Discussed with: CRNA and  Anesthesiologist  Anesthesia Plan Comments:        Anesthesia Quick Evaluation

## 2022-06-13 NOTE — Progress Notes (Signed)
ANTICOAGULATION CONSULT NOTE  Pharmacy Consult for heparin Indication:  LVAD  No Known Allergies  Patient Measurements: Height: 5\' 10"  (177.8 cm) Weight: 77.9 kg (171 lb 11.8 oz) IBW/kg (Calculated) : 73 Heparin Dosing Weight: 80kg  Vital Signs: Temp: 97.7 F (36.5 C) (07/06 0804) Temp Source: Oral (07/06 0804) BP: 101/82 (07/06 0804) Pulse Rate: 88 (07/06 0804)  Labs: Recent Labs    06/12/22 1230 06/12/22 2121 06/12/22 2223 06/13/22 0603  HGB 9.5*  --   --  8.9*  HCT 28.6*  --   --  27.3*  PLT 385  --   --  317  LABPROT 20.0*  --   --  16.4*  INR 1.7*  --   --  1.3*  HEPARINUNFRC  --  0.46 <0.10* <0.10*  CREATININE 1.75*  --   --  1.79*  CKTOTAL  --   --   --  897*     Estimated Creatinine Clearance: 44.7 mL/min (A) (by C-G formula based on SCr of 1.79 mg/dL (H)).   Medical History: Past Medical History:  Diagnosis Date   "    Arthritis    CAD (coronary artery disease)    a. s/p CABG in 11/2019 with LIMA-LAD, SVG-OM1, SVG-PDA and SVG-D1   CHF (congestive heart failure) (HCC)    a. EF < 20% by echo in 11/2019   Essential hypertension    PAF (paroxysmal atrial fibrillation) (HCC)    Type 2 diabetes mellitus (HCC)     Assessment: 20 yoM with HM3 LVAD admitted with ongoing LVAD driveline infection. Pt with planned I&D in OR 7/7. Pt is on warfarin PTA with last dose taken 7/3.   INR subtherapeutic at 1.3, covering with low dose IV heparin 500 units/h. Heparin level <0.1 as expected. CBC and LDH stable, debridement planned tomorrow.  Goal of Therapy:  Heparin level ~0.3 units/ml Monitor platelets by anticoagulation protocol: Yes   Plan:  Hold warfarin for debridement Heparin 500 units/h no bolus - no titrations for now Daily heparin level, CBC, LDH  9/7, PharmD, [place], [facility] Clinical Pharmacist [phone] Please check AMION for all [facility] numbers 06/13/2022

## 2022-06-13 NOTE — Progress Notes (Signed)
CT Surgery  Discussed plan for Debridement of VAD  abdominal power cord tunnel and wound VAC placement under anesthesia with the patient planned in am. This is needed because  space has developed around the cord tracking to the midline. Will plan on leaving VAC in place until late next week then return to OR for wound evaluation.   He understands and agrees with the surgery Heparin off on call to OR

## 2022-06-14 ENCOUNTER — Encounter (HOSPITAL_COMMUNITY)
Admission: AD | Disposition: A | Discharge status: Home / Self Care | Payer: Self-pay | Source: Home / Self Care | Attending: Cardiology

## 2022-06-14 ENCOUNTER — Inpatient Hospital Stay (HOSPITAL_COMMUNITY): Payer: 59 | Admitting: Anesthesiology

## 2022-06-14 ENCOUNTER — Encounter (HOSPITAL_COMMUNITY): Payer: Self-pay | Admitting: Cardiology

## 2022-06-14 ENCOUNTER — Inpatient Hospital Stay (HOSPITAL_COMMUNITY): Payer: 59

## 2022-06-14 ENCOUNTER — Other Ambulatory Visit: Payer: Self-pay

## 2022-06-14 DIAGNOSIS — J449 Chronic obstructive pulmonary disease, unspecified: Secondary | ICD-10-CM | POA: Diagnosis not present

## 2022-06-14 DIAGNOSIS — R748 Abnormal levels of other serum enzymes: Secondary | ICD-10-CM | POA: Diagnosis not present

## 2022-06-14 DIAGNOSIS — Z87891 Personal history of nicotine dependence: Secondary | ICD-10-CM | POA: Diagnosis not present

## 2022-06-14 DIAGNOSIS — T827XXA Infection and inflammatory reaction due to other cardiac and vascular devices, implants and grafts, initial encounter: Secondary | ICD-10-CM

## 2022-06-14 DIAGNOSIS — B9562 Methicillin resistant Staphylococcus aureus infection as the cause of diseases classified elsewhere: Secondary | ICD-10-CM | POA: Diagnosis not present

## 2022-06-14 DIAGNOSIS — G4730 Sleep apnea, unspecified: Secondary | ICD-10-CM

## 2022-06-14 HISTORY — PX: APPLICATION OF WOUND VAC: SHX5189

## 2022-06-14 HISTORY — PX: STERNAL WOUND DEBRIDEMENT: SHX1058

## 2022-06-14 LAB — GLUCOSE, CAPILLARY
Glucose-Capillary: 174 mg/dL — ABNORMAL HIGH (ref 70–99)
Glucose-Capillary: 129 mg/dL — ABNORMAL HIGH (ref 70–99)
Glucose-Capillary: 199 mg/dL — ABNORMAL HIGH (ref 70–99)
Glucose-Capillary: 337 mg/dL — ABNORMAL HIGH (ref 70–99)
Glucose-Capillary: 227 mg/dL — ABNORMAL HIGH (ref 70–99)

## 2022-06-14 LAB — CBC
HCT: 27.6 % — ABNORMAL LOW (ref 39.0–52.0)
Hemoglobin: 9.1 g/dL — ABNORMAL LOW (ref 13.0–17.0)
MCH: 27.2 pg (ref 26.0–34.0)
MCHC: 33 g/dL (ref 30.0–36.0)
MCV: 82.6 fL (ref 80.0–100.0)
Platelets: 310 10*3/uL (ref 150–400)
RBC: 3.34 MIL/uL — ABNORMAL LOW (ref 4.22–5.81)
RDW: 16 % — ABNORMAL HIGH (ref 11.5–15.5)
WBC: 8.4 10*3/uL (ref 4.0–10.5)
nRBC: 0.2 % (ref 0.0–0.2)

## 2022-06-14 LAB — BASIC METABOLIC PANEL
Anion gap: 12 (ref 5–15)
BUN: 27 mg/dL — ABNORMAL HIGH (ref 8–23)
CO2: 26 mmol/L (ref 22–32)
Calcium: 8.7 mg/dL — ABNORMAL LOW (ref 8.9–10.3)
Chloride: 99 mmol/L (ref 98–111)
Creatinine, Ser: 1.87 mg/dL — ABNORMAL HIGH (ref 0.61–1.24)
GFR, Estimated: 40 mL/min — ABNORMAL LOW (ref >60)
Glucose, Bld: 211 mg/dL — ABNORMAL HIGH (ref 70–99)
Potassium: 3.8 mmol/L (ref 3.5–5.1)
Sodium: 137 mmol/L (ref 135–145)

## 2022-06-14 LAB — PROTIME-INR
INR: 1.3 — ABNORMAL HIGH (ref 0.8–1.2)
Prothrombin Time: 15.9 seconds — ABNORMAL HIGH (ref 11.4–15.2)

## 2022-06-14 LAB — HEPARIN LEVEL (UNFRACTIONATED): Heparin Unfractionated: <0.1 IU/mL — ABNORMAL LOW (ref 0.30–0.70)

## 2022-06-14 LAB — LACTATE DEHYDROGENASE: LDH: 244 U/L — ABNORMAL HIGH (ref 98–192)

## 2022-06-14 LAB — CK: Total CK: 1000 U/L — ABNORMAL HIGH (ref 49–397)

## 2022-06-14 SURGERY — DEBRIDEMENT, WOUND, STERNUM
Anesthesia: General | Site: Abdomen | Laterality: Left

## 2022-06-14 MED ORDER — MIDAZOLAM HCL 2 MG/2ML IJ SOLN
INTRAMUSCULAR | Status: AC
  Filled 2022-06-14: qty 2

## 2022-06-14 MED ORDER — WARFARIN SODIUM 3 MG PO TABS: 6.0000 mg | ORAL_TABLET | Freq: Once | ORAL | Status: DC

## 2022-06-14 MED ORDER — HEPARIN (PORCINE) 25000 UT/250ML-% IV SOLN
500.0000 [IU]/h | INTRAVENOUS | Status: DC
  Administered 2022-06-14 – 2022-06-18 (×3): 500 [IU]/h via INTRAVENOUS
  Filled 2022-06-14 (×3): qty 250

## 2022-06-14 MED ORDER — SODIUM CHLORIDE 0.9 % IR SOLN
Status: DC | PRN
  Administered 2022-06-14: 1000 mL

## 2022-06-14 MED ORDER — PHENYLEPHRINE HCL-NACL 20-0.9 MG/250ML-% IV SOLN
INTRAVENOUS | Status: DC | PRN
  Administered 2022-06-14: 25 ug/min via INTRAVENOUS

## 2022-06-14 MED ORDER — TRAMADOL HCL 50 MG PO TABS
50.0000 mg | ORAL_TABLET | Freq: Four times a day (QID) | ORAL | Status: DC | PRN
  Administered 2022-06-14 – 2022-07-14 (×4): 50 mg via ORAL
  Filled 2022-06-14 (×4): qty 1

## 2022-06-14 MED ORDER — WARFARIN - PHARMACIST DOSING INPATIENT: Freq: Every day | Status: DC

## 2022-06-14 MED ORDER — ROCURONIUM BROMIDE 10 MG/ML (PF) SYRINGE
PREFILLED_SYRINGE | INTRAVENOUS | Status: DC | PRN
  Administered 2022-06-14: 10 mg via INTRAVENOUS
  Administered 2022-06-14: 50 mg via INTRAVENOUS

## 2022-06-14 MED ORDER — PHENYLEPHRINE 80 MCG/ML (10ML) SYRINGE FOR IV PUSH (FOR BLOOD PRESSURE SUPPORT)
PREFILLED_SYRINGE | INTRAVENOUS | Status: DC | PRN
  Administered 2022-06-14 (×3): 80 ug via INTRAVENOUS

## 2022-06-14 MED ORDER — LACTATED RINGERS IV SOLN: INTRAVENOUS | Status: DC | PRN

## 2022-06-14 MED ORDER — FENTANYL CITRATE (PF) 250 MCG/5ML IJ SOLN
INTRAMUSCULAR | Status: DC | PRN
  Administered 2022-06-14 (×2): 50 ug via INTRAVENOUS

## 2022-06-14 MED ORDER — VANCOMYCIN HCL 1000 MG IV SOLR
INTRAVENOUS | Status: AC
  Filled 2022-06-14: qty 20

## 2022-06-14 MED ORDER — HEMOSTATIC AGENTS (NO CHARGE) OPTIME
TOPICAL | Status: DC | PRN
  Administered 2022-06-14 (×3): 1 via TOPICAL

## 2022-06-14 MED ORDER — ALBUMIN HUMAN 5 % IV SOLN: INTRAVENOUS | Status: DC | PRN

## 2022-06-14 MED ORDER — AMIODARONE HCL IN DEXTROSE 360-4.14 MG/200ML-% IV SOLN
INTRAVENOUS | Status: DC | PRN
  Administered 2022-06-14: 60 mg/h via INTRAVENOUS

## 2022-06-14 MED ORDER — AMIODARONE HCL IN DEXTROSE 360-4.14 MG/200ML-% IV SOLN: 60.0000 mg/h | INTRAVENOUS | Status: DC

## 2022-06-14 MED ORDER — VANCOMYCIN HCL 1000 MG IV SOLR
INTRAVENOUS | Status: DC | PRN
  Administered 2022-06-14: 1000 mg

## 2022-06-14 MED ORDER — BOOST PO LIQD
237.0000 mL | Freq: Three times a day (TID) | ORAL | Status: DC
  Administered 2022-06-14 – 2022-07-17 (×78): 237 mL via ORAL
  Filled 2022-06-14 (×104): qty 237

## 2022-06-14 MED ORDER — AMIODARONE IV BOLUS ONLY 150 MG/100ML
INTRAVENOUS | Status: DC | PRN
  Administered 2022-06-14: 150 mg via INTRAVENOUS

## 2022-06-14 MED ORDER — FENTANYL CITRATE (PF) 250 MCG/5ML IJ SOLN
INTRAMUSCULAR | Status: AC
  Filled 2022-06-14: qty 5

## 2022-06-14 MED ORDER — PROPOFOL 10 MG/ML IV BOLUS
INTRAVENOUS | Status: AC
  Filled 2022-06-14: qty 20

## 2022-06-14 MED ORDER — WARFARIN SODIUM 4 MG PO TABS
4.0000 mg | ORAL_TABLET | Freq: Once | ORAL | Status: AC
  Administered 2022-06-14: 4 mg via ORAL
  Filled 2022-06-14: qty 1

## 2022-06-14 MED ORDER — SUGAMMADEX SODIUM 200 MG/2ML IV SOLN
INTRAVENOUS | Status: DC | PRN
  Administered 2022-06-14: 160 mg via INTRAVENOUS

## 2022-06-14 MED ORDER — MIDAZOLAM HCL 5 MG/5ML IJ SOLN
INTRAMUSCULAR | Status: DC | PRN
  Administered 2022-06-14 (×2): 1 mg via INTRAVENOUS
  Administered 2022-06-14: 2 mg via INTRAVENOUS

## 2022-06-14 MED ORDER — PROPOFOL 10 MG/ML IV BOLUS
INTRAVENOUS | Status: DC | PRN
  Administered 2022-06-14: 10 mg via INTRAVENOUS
  Administered 2022-06-14: 60 mg via INTRAVENOUS

## 2022-06-14 MED ORDER — MORPHINE SULFATE (PF) 4 MG/ML IV SOLN
4.0000 mg | INTRAVENOUS | Status: DC | PRN
  Administered 2022-06-14 – 2022-07-17 (×64): 4 mg via INTRAVENOUS
  Filled 2022-06-14 (×67): qty 1

## 2022-06-14 MED ORDER — DEXAMETHASONE SODIUM PHOSPHATE 10 MG/ML IJ SOLN
INTRAMUSCULAR | Status: DC | PRN
  Administered 2022-06-14: 4 mg via INTRAVENOUS

## 2022-06-14 MED ORDER — AMIODARONE HCL IN DEXTROSE 360-4.14 MG/200ML-% IV SOLN: 30.0000 mg/h | INTRAVENOUS | Status: DC

## 2022-06-14 MED ORDER — LIDOCAINE 2% (20 MG/ML) 5 ML SYRINGE
INTRAMUSCULAR | Status: DC | PRN
  Administered 2022-06-14: 80 mg via INTRAVENOUS
  Administered 2022-06-14: 20 mg via INTRAVENOUS

## 2022-06-14 MED ORDER — VANCOMYCIN HCL 1250 MG/250ML IV SOLN
1250.0000 mg | Freq: Once | INTRAVENOUS | Status: AC
  Administered 2022-06-14: 1250 mg via INTRAVENOUS
  Filled 2022-06-14: qty 250

## 2022-06-14 MED ORDER — VANCOMYCIN HCL IN DEXTROSE 1-5 GM/200ML-% IV SOLN
1000.0000 mg | INTRAVENOUS | Status: DC
  Administered 2022-06-15 – 2022-07-08 (×24): 1000 mg via INTRAVENOUS
  Filled 2022-06-14 (×25): qty 200

## 2022-06-14 MED ORDER — ONDANSETRON HCL 4 MG/2ML IJ SOLN
INTRAMUSCULAR | Status: DC | PRN
  Administered 2022-06-14: 4 mg via INTRAVENOUS

## 2022-06-14 MED ORDER — AMIODARONE HCL IN DEXTROSE 360-4.14 MG/200ML-% IV SOLN
30.0000 mg/h | INTRAVENOUS | Status: DC
  Administered 2022-06-14 (×2): 60 mg/h via INTRAVENOUS
  Administered 2022-06-15: 30 mg/h via INTRAVENOUS
  Filled 2022-06-14 (×3): qty 200

## 2022-06-14 SURGICAL SUPPLY — 55 items
BENZOIN TINCTURE PRP APPL 2/3 (GAUZE/BANDAGES/DRESSINGS) ×2 IMPLANT
BLADE SURG 10 STRL SS (BLADE) ×2 IMPLANT
BLADE SURG 15 STRL LF DISP TIS (BLADE) IMPLANT
BLADE SURG 15 STRL SS (BLADE)
BNDG GAUZE ELAST 4 BULKY (GAUZE/BANDAGES/DRESSINGS) IMPLANT
BRUSH SCRUB EZ PLAIN DRY (MISCELLANEOUS) ×2 IMPLANT
CANISTER SUCT 3000ML PPV (MISCELLANEOUS) ×2 IMPLANT
CANISTER WOUND CARE 500ML ATS (WOUND CARE) ×2 IMPLANT
CANISTER WOUNDNEG PRESSURE 500 (CANNISTER) ×1 IMPLANT
CLIP VESOCCLUDE SM WIDE 24/CT (CLIP) IMPLANT
CNTNR URN SCR LID CUP LEK RST (MISCELLANEOUS) IMPLANT
CONN Y 3/8X3/8X3/8  BEN (MISCELLANEOUS)
CONN Y 3/8X3/8X3/8 BEN (MISCELLANEOUS) IMPLANT
CONT SPEC 4OZ STRL OR WHT (MISCELLANEOUS)
COVER SURGICAL LIGHT HANDLE (MISCELLANEOUS) ×3 IMPLANT
DRAPE LAPAROSCOPIC ABDOMINAL (DRAPES) ×2 IMPLANT
DRAPE SLUSH/WARMER DISC (DRAPES) IMPLANT
DRAPE WARM FLUID 44X44 (DRAPES) IMPLANT
DRSG AQUACEL AG ADV 3.5X14 (GAUZE/BANDAGES/DRESSINGS) ×2 IMPLANT
DRSG PAD ABDOMINAL 8X10 ST (GAUZE/BANDAGES/DRESSINGS) IMPLANT
DRSG VAC ATS LRG SENSATRAC (GAUZE/BANDAGES/DRESSINGS) ×2 IMPLANT
DRSG VAC ATS MED SENSATRAC (GAUZE/BANDAGES/DRESSINGS) ×2 IMPLANT
DRSG VAC ATS SM SENSATRAC (GAUZE/BANDAGES/DRESSINGS) ×3 IMPLANT
ELECT BLADE 4.0 EZ CLEAN MEGAD (MISCELLANEOUS) ×2
ELECT REM PT RETURN 9FT ADLT (ELECTROSURGICAL) ×2
ELECT SOLID GEL RDN PRO-PADZ (MISCELLANEOUS) ×2
ELECTRODE BLDE 4.0 EZ CLN MEGD (MISCELLANEOUS) IMPLANT
ELECTRODE REM PT RTRN 9FT ADLT (ELECTROSURGICAL) ×1 IMPLANT
ELECTRODE SOLI GEL RDN PROPADZ (MISCELLANEOUS) IMPLANT
GAUZE SPONGE 4X4 12PLY STRL (GAUZE/BANDAGES/DRESSINGS) ×2 IMPLANT
GAUZE XEROFORM 5X9 LF (GAUZE/BANDAGES/DRESSINGS) IMPLANT
GLOVE BIO SURGEON STRL SZ 6 (GLOVE) ×1 IMPLANT
GLOVE BIO SURGEON STRL SZ 6.5 (GLOVE) ×2 IMPLANT
GLOVE BIO SURGEON STRL SZ7.5 (GLOVE) ×6 IMPLANT
GOWN STRL REUS W/ TWL LRG LVL3 (GOWN DISPOSABLE) ×4 IMPLANT
GOWN STRL REUS W/TWL LRG LVL3 (GOWN DISPOSABLE) ×4
HANDPIECE INTERPULSE COAX TIP (DISPOSABLE) ×2
KIT BASIN OR (CUSTOM PROCEDURE TRAY) ×2 IMPLANT
KIT SUCTION CATH 14FR (SUCTIONS) IMPLANT
KIT TURNOVER KIT B (KITS) ×2 IMPLANT
NS IRRIG 1000ML POUR BTL (IV SOLUTION) ×2 IMPLANT
PACK GENERAL/GYN (CUSTOM PROCEDURE TRAY) ×3 IMPLANT
PAD ARMBOARD 7.5X6 YLW CONV (MISCELLANEOUS) ×4 IMPLANT
SET HNDPC FAN SPRY TIP SCT (DISPOSABLE) ×1 IMPLANT
SOL PREP POV-IOD 4OZ 10% (MISCELLANEOUS) IMPLANT
SPONGE T-LAP 18X18 ~~LOC~~+RFID (SPONGE) ×7 IMPLANT
SPONGE T-LAP 4X18 ~~LOC~~+RFID (SPONGE) ×2 IMPLANT
STAPLER VISISTAT 35W (STAPLE) IMPLANT
SUT ETHILON 3 0 FSL (SUTURE) IMPLANT
SUT VIC AB 3-0 SH 8-18 (SUTURE) ×1 IMPLANT
SWAB COLLECTION DEVICE MRSA (MISCELLANEOUS) IMPLANT
SWAB CULTURE ESWAB REG 1ML (MISCELLANEOUS) IMPLANT
TOWEL GREEN STERILE (TOWEL DISPOSABLE) ×2 IMPLANT
TOWEL GREEN STERILE FF (TOWEL DISPOSABLE) ×2 IMPLANT
WATER STERILE IRR 1000ML POUR (IV SOLUTION) ×2 IMPLANT

## 2022-06-14 NOTE — Anesthesia Postprocedure Evaluation (Signed)
Anesthesia Post Note  Patient: FAYE SANFILIPPO  Procedure(s) Performed: DEBRIDEMENT OF VAD POWERCORD TUNNEL (Left: Abdomen) APPLICATION OF WOUND VAC (Left: Abdomen)     Patient location during evaluation: PACU Anesthesia Type: General Level of consciousness: awake and alert Pain management: pain level controlled Vital Signs Assessment: post-procedure vital signs reviewed and stable Respiratory status: spontaneous breathing, nonlabored ventilation and respiratory function stable Cardiovascular status: stable and blood pressure returned to baseline Anesthetic complications: no   No notable events documented.  Last Vitals:  Vitals:   06/14/22 1000 06/14/22 1023  BP: 108/90 114/90  Pulse: 62 66  Resp: 12 14  Temp: 36.4 C (!) 36.3 C  SpO2: 100% 100%                 Beryle Lathe

## 2022-06-14 NOTE — TOC CM/SW Note (Signed)
HF TOC CM received confirmation from [employer] rep, Tracy wound vac was approved by insurance. Will send paperwork for pt to sign and will send back to [facility] for pick up of wound vac. Isidoro Donning RN3 CCM, Heart Failure TOC CM [phone]

## 2022-06-14 NOTE — Progress Notes (Signed)
Pre Procedure note for inpatients:   Kevin Underwood has been scheduled for Procedure(s): DEBRIDEMENT OF VAD POWERCORD TUNNEL (N/A) APPLICATION OF WOUND VAC (N/A) today. The various methods of treatment have been discussed with the patient. After consideration of the risks, benefits and treatment options the patient has consented to the planned procedure.   The patient has been seen and labs reviewed. There are no changes in the patient's condition to prevent proceeding with the planned procedure today.  Recent labs:  Lab Results  Component Value Date   WBC 8.4 06/14/2022   HGB 9.1 (L) 06/14/2022   HCT 27.6 (L) 06/14/2022   PLT 310 06/14/2022   GLUCOSE 211 (H) 06/14/2022   CHOL 142 09/11/2021   TRIG 95 09/11/2021   HDL 62 09/11/2021   LDLCALC 61 09/11/2021   ALT 86 (H) 06/12/2022   AST 68 (H) 06/12/2022   NA 137 06/14/2022   K 3.8 06/14/2022   CL 99 06/14/2022   CREATININE 1.87 (H) 06/14/2022   BUN 27 (H) 06/14/2022   CO2 26 06/14/2022   TSH 7.790 (H) 05/14/2022   INR 1.3 (H) 06/14/2022   HGBA1C 8.2 (H) 05/13/2022    Lovett Sox, MD 06/14/2022 7:31 AM

## 2022-06-14 NOTE — Progress Notes (Signed)
Type 2 Dm with hyperglycemia Progress Note   Date: 06/14/2022  Patient Name: Kevin Underwood        MRN#: [id number]  Review the patient's clinical findings supports the diagnosis of:  Type 2 DM with hyperglycemia

## 2022-06-14 NOTE — Progress Notes (Signed)
VAD Coordinator Procedure Note:   VAD Coordinator met patient in holding room 69. Pt undergoing VAD drive line debridement with wound vac placement  per Dr. Prescott Gum. Hemodynamics and VAD parameters monitored by myself and anesthesia throughout the procedure. Blood pressures were obtained with automatic cuff on left arm and correlated with Doppler MAP.    Time: Doppler Auto  BP Flow PI Power Speed  Pre-procedure:           06:45 90 110/85 (94) 4.1 3.6 4.0 5500           Secation Induction:          07:45  122/97 (105) 3.2 8.0 4.0    08:00  75/64 (70) 4.4 2.0 4.0    08:10  97/78 (84) 2.4 10.4 3.9 5400   08:15  69/54 (61) 4.2 1.7 3.6    08:20  79/69 (75) 4.5 2.8 3.9    08:25  104/86 (93) 4.2 3.0 4.0 5500   08:30  82/62 (71) 4.1 2.4 3.8    08:35  79/63 (70) 3.8 1.7 3.7    08:45  84/70 (76) 3.9 1.4 3.7    09:00  89/73 (80) 3.9 1.7 3.8    09:15  99/80 (87) 4.5 3.4 3.7            Recovery Area:          09:30  113/81 (90) 4.4 2.7 4.0    09:45  104/76 (87) 4.0 3.6 3.9    10:00  104/76 (87) 4.0 3.4 4.0              Patient developed frequent PVCs, bigeminy with low BP and Low Flow alarm on VAD. Pt was given IV Lidocaine, amiodarone, albumin, and neo during case per anesthesia.   Per Dr. Darcey Nora, re-start Heparin in 4 hrs, re-start oral warfarin this evening. Probable return to OR, Thursday 06/20/22 for wound vac change.    VAD Coordinator accompanied and remained with patient in recovery area.    Patient Disposition: [place] RN, VAD Coordinator 24/7 VAD pager: [phone]

## 2022-06-14 NOTE — Progress Notes (Addendum)
Advanced Heart Failure VAD Team Note  PCP-Cardiologist: Rozann Lesches, MD   Subjective:    Just returned from OR, s/p debridement of VAD driveline tunnel infection. Complaining of pain.   Remains on daptomycin. ID following.   Crestor discontinued yesterday w/ elevated CK level, which continues to rise, 897>>1,000 today. No muscle pain.   SCr stable, 1.87  LVAD INTERROGATION:  HeartMate III LVAD:   Flow 4.2 liters/min, speed 5500, power 4.0, PI 3.9  Multiple PI events. 1 low flow alarm this morning   Objective:    Vital Signs:   Temp:  [97.6 F (36.4 C)-99.3 F (37.4 C)] 97.6 F (36.4 C) (07/07 1000) Pulse Rate:  [62-88] 62 (07/07 1000) Resp:  [12-20] 12 (07/07 1000) BP: (101-113)/(76-90) 108/90 (07/07 1000) SpO2:  [94 %-100 %] 100 % (07/07 1000) Weight:  [79.2 kg] 79.2 kg (07/07 0518) Last BM Date : 06/12/22 Mean arterial Pressure 90s  Intake/Output:   Intake/Output Summary (Last 24 hours) at 06/14/2022 1021 Last data filed at 06/14/2022 0907 Gross per 24 hour  Intake 1611.03 ml  Output 1405 ml  Net 206.03 ml     Physical Exam    General:  Well appearing. MAD HEENT: normal Neck: supple. JVD 7 cm. Carotids 2+ bilat; no bruits. No lymphadenopathy or thyromegaly appreciated. Cor: Mechanical heart sounds with LVAD hum present. Lungs: CTAB. No wheezing  Abdomen: soft, nontender, nondistended. bowel sounds. Driveline: Dressing over driveline site. securement device intact and driveline incorporated + wound vac  Extremities: no cyanosis, clubbing, rash, edema Neuro: alert & orientedx3, cranial nerves grossly intact. moves all 4 extremities w/o difficulty. Affect pleasant   Telemetry   SR 80s (personally reviewed)  Labs   Basic Metabolic Panel: Recent Labs  Lab 06/12/22 1230 06/13/22 0603 06/14/22 0318  NA 135 137 137  K 3.9 4.2 3.8  CL 100 98 99  CO2 26 26 26   GLUCOSE 153* 163* 211*  BUN 24* 29* 27*  CREATININE 1.75* 1.79* 1.87*  CALCIUM 8.4* 8.9  8.7*    Liver Function Tests: Recent Labs  Lab 06/12/22 1230  AST 68*  ALT 86*  ALKPHOS 114  BILITOT 0.2*  PROT 6.9  ALBUMIN 2.7*   No results for input(s): "LIPASE", "AMYLASE" in the last 168 hours. No results for input(s): "AMMONIA" in the last 168 hours.  CBC: Recent Labs  Lab 06/12/22 1230 06/13/22 0603 06/14/22 0318  WBC 10.5 9.4 8.4  NEUTROABS 8.0*  --   --   HGB 9.5* 8.9* 9.1*  HCT 28.6* 27.3* 27.6*  MCV 82.2 81.0 82.6  PLT 385 317 310    INR: Recent Labs  Lab 06/07/22 1224 06/12/22 1230 06/13/22 0603 06/14/22 0318  INR 2.4* 1.7* 1.3* 1.3*    Other results: EKG:    Imaging   CT ABDOMEN PELVIS WO CONTRAST  Result Date: 06/14/2022 CLINICAL DATA:  LVAD drive line infection EXAM: CT ABDOMEN AND PELVIS WITHOUT CONTRAST TECHNIQUE: Multidetector CT imaging of the abdomen and pelvis was performed following the standard protocol without IV contrast. RADIATION DOSE REDUCTION: This exam was performed according to the departmental dose-optimization program which includes automated exposure control, adjustment of the mA and/or kV according to patient size and/or use of iterative reconstruction technique. COMPARISON:  CT abdomen and pelvis dated June 04, 2022 FINDINGS: Lower chest: No acute abnormality. Hepatobiliary: No focal liver abnormality is seen. No gallstones, gallbladder wall thickening, or biliary dilatation. Pancreas: Unremarkable. No pancreatic ductal dilatation or surrounding inflammatory changes. Spleen: Normal in size without  focal abnormality. Adrenals/Urinary Tract: Bilateral adrenal glands are unremarkable. No hydronephrosis. Punctate nonobstructing stone of the upper pole the left kidney. Bladder is unremarkable. Stomach/Bowel: Stomach is within normal limits. Appendix appears normal. Diverticulosis. No evidence of bowel wall thickening, distention, or inflammatory changes. Vascular/Lymphatic: Aortic atherosclerosis. No enlarged abdominal or pelvic lymph  nodes. Reproductive: Prostate is unremarkable. Other: Soft tissue stranding and trace fluid seen about the LVAD drive line in the upper abdomen. Musculoskeletal: Prior posterior fusion of L4-S1. No aggressive osseous lesions. IMPRESSION: 1. Soft tissue stranding and trace fluid seen about the LVAD drive line in the upper abdomen, unchanged when compared with prior exam and compatible with history of drive line infection. 2. Punctate nonobstructing stone of the upper pole the left kidney. 3.  Aortic Atherosclerosis (ICD10-I70.0). Electronically Signed   By: Yetta Glassman M.D.   On: 06/14/2022 08:21   DG Chest Port 1 View  Result Date: 06/13/2022 CLINICAL DATA:  Preop EXAM: PORTABLE CHEST 1 VIEW COMPARISON:  Chest radiograph dated June 04, 2022 FINDINGS: The heart size and mediastinal contours are within normal limits. Atherosclerotic calcification of the aortic arch. Sternotomy wires and left atrial appendage clip are unchanged. Left ventricular assist device projecting over the left hemithorax. Both lungs are clear without evidence of focal consolidation or pleural effusion. The visualized skeletal structures are unremarkable. Bilateral axillary surgical clips. IMPRESSION: No active disease. Electronically Signed   By: Keane Police D.O.   On: 06/13/2022 08:33     Medications:     Scheduled Medications:  allopurinol  100 mg Oral Daily   Chlorhexidine Gluconate Cloth  6 each Topical Daily   docusate sodium  200 mg Oral BID   gabapentin  600 mg Oral BID   guaiFENesin  600 mg Oral BID   hydrALAZINE  25 mg Oral TID   insulin aspart  0-15 Units Subcutaneous TID WC   insulin aspart  0-5 Units Subcutaneous QHS   lactose free nutrition  237 mL Oral TID WC   loratadine  10 mg Oral Daily   methimazole  20 mg Oral BID   metoprolol succinate  25 mg Oral Daily   mupirocin ointment  1 Application Nasal BID   pantoprazole  40 mg Oral Daily   sertraline  50 mg Oral Daily   tamsulosin  0.4 mg Oral Daily    traZODone  150 mg Oral QHS    Infusions:  amiodarone     amiodarone     DAPTOmycin (CUBICIN) 650 mg in sodium chloride 0.9 % IVPB Stopped (06/13/22 2130)    PRN Medications: acetaminophen, albuterol, ondansetron (ZOFRAN) IV   Patient Profile   61 y.o. male with history of HM III LVAD, chronic systolic CHF/ICM, COPD, PAF. 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   Readmitted for driveline infection and surgical debridement/possible wound vac placement.  Assessment/Plan:    1. Driveline infection: Recent driveline infection [id number]. S/p driveline site debridement 6/8. Surgical wound cultures grew MRSA, repeat debridement 6/23.   - Seen in VAD clinic 06/30 and noted to have increased wound drainage and tunneling of wound.   - Wound culture 06/30 again grew MRSA.  - Back to OR today for repeat debridement - anticipate return to OR next week for wound vac change  - Had nausea and AKI while on vancomycin. Currently on IV daptomycin. CK 92 about a week ago, up to 897>>1,000 this am. Statin therapy discontinued.  - ID consulted. Need recs regarding continuation  of daptomycin in setting of elevated CK levels.  3.  Chronic systolic CHF: Echo 10/21 with EF 20-25%, mildly decreased RV function. LHC/RHC in 12/21 with patent grafts, low output. Suspect mixed ischemic/nonischemic cardiomyopathy (prior heavy ETOH and drugs as well as CAD).  No ETOH, drugs, smoking since CABG in 12/20. Admitted with cardiogenic shock in 12/21, had placement of Impella 5.5 initially, now s/p Heartmate 3 LVAD on 11/17/20.  Ramp echo 2/23 with speed decreased to 5500 rpm. He would eventually like heart transplant. NYHA class II. Have been allowing MAP to run higher due to recent AKI thought to be due to lowering BP too much in setting of right renal artery stenosis.  - MAPs currently 90s - Continue hydralazine 25 mg TID - Continue Toprol XL 25 mg daily.  - No diuretic. Does not  appear volume overloaded.  - INR 1.3. Per Dr. Donata Clay, start heparin gtt 4 hrs post op + warfarin tonight - LDH 244>249>244 4. AKI: Creatinine up to 4.6 last admission, initially thought this was ATN from low BP but he was never markedly hypotensive and LFTs without any evidence of shock liver. ? Vanc toxicity (troughs were ok). Taken off Entresto and hydralazine. Volume repleted with IVF. In reviewing prior CTA abdomen/pelvis, he has high-grade stenosis of proximal right renal artery. D/w Renal and feel like he may need higher perfusion pressures.  - Would consider reassessment of renal artery stenosis and possible treatment once creatinine has improved to baseline.  - Scr overall improved, 1.79>>1.87  - Watch MAPs, avoid hypotension 5. CAD: S/p CABG 12/20.  LHC pre-VAD with patent grafts, no target for intervention. - Stop statin d/t elevated CK. 6. Atrial fibrillation: Paroxysmal.  S/p Maze and LA appendage clip with CABG in 12/20. Maintaining NSR.  - Continue Toprol XL 25 mg daily.  - Off amiodarone with hyperthyroidism. - Warfarin + heparin per above  7. Type 2 diabetes: Takes glipizide at home.  - SSI  8. 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  9. 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  He missed his endocrinology appt. Has been rescheduled for July 27th. - He is now off amiodarone.   - Continue methimazole 20 mg bid, TSH was mildly elevated.   10. Gout: On allopurinol.  11. Anemia: Hgb 7.7 during recent admit. Iron deficient, given feraheme. -Hgb up to 9.1   I reviewed the LVAD parameters from today, and compared the results to the patient's prior recorded data.  No programming changes were made.  The LVAD is functioning within specified parameters.  The patient performs LVAD self-test daily.  LVAD interrogation was negative for any significant power changes, alarms or PI events/speed drops.  LVAD equipment check completed and is in good  working order.  Back-up equipment present.   LVAD education done on emergency procedures and precautions and reviewed exit site care.  Length of Stay: 2  Kevin Lis, PA-C 06/14/2022, 10:21 AM  VAD Team --- VAD ISSUES ONLY--- Pager [phone] (7am - 7am)  Advanced Heart Failure Team  Pager [phone] (M-F; 7a - 5p)  Please contact [facility] for night-coverage after hours (5p -7a ) and weekends on [email or web address]  Patient seen with PA, agree with the above note.   He is now s/p driveline debridement with wound vac placement.  Complains of pain at surgical site.  Creatinine stable 1.87 with MAP 80s-90s.   General: Well appearing this am. NAD.  HEENT: Normal. Neck: Supple, JVP 7-8 cm.  Carotids OK.  Cardiac:  Mechanical heart sounds with LVAD hum present.  Lungs:  CTAB, normal effort.  Abdomen:  NT, ND, no HSM. No bruits or masses. +BS  LVAD exit site: Wound vac Extremities:  Warm and dry. No cyanosis, clubbing, rash, or edema.  Neuro:  Alert & oriented x 3. Cranial nerves grossly intact. Moves all 4 extremities w/o difficulty. Affect pleasant    S/p driveline site debridement and wound vac.  Can restart heparin gtt and warfarin later today. Will need to go back to OR likely next Thursday for wound vac change.   Frequent PVCs in OR, was started on amiodarone gtt to suppress.  Will continue for now, hopefully will stop eventually.    Will plan for ramp echo on Monday.   On daptomycin but CK rising, ID to change to vancomycin and carefully monitor.   Marca Ancona 06/14/2022 12:09 PM

## 2022-06-14 NOTE — Plan of Care (Signed)
  Problem: Education: Goal: Knowledge of General Education information will improve Description: Including pain rating scale, medication(s)/side effects and non-pharmacologic comfort measures Outcome: Progressing   Problem: Health Behavior/Discharge Planning: Goal: Ability to manage health-related needs will improve Outcome: Progressing   Problem: Clinical Measurements: Goal: Ability to maintain clinical measurements within normal limits will improve Outcome: Progressing Goal: Will remain free from infection Outcome: Progressing Goal: Diagnostic test results will improve Outcome: Progressing Goal: Respiratory complications will improve Outcome: Progressing Goal: Cardiovascular complication will be avoided Outcome: Progressing   Problem: Activity: Goal: Risk for activity intolerance will decrease Outcome: Progressing   Problem: Nutrition: Goal: Adequate nutrition will be maintained Outcome: Progressing   Problem: Coping: Goal: Level of anxiety will decrease Outcome: Progressing   Problem: Elimination: Goal: Will not experience complications related to bowel motility Outcome: Progressing Goal: Will not experience complications related to urinary retention Outcome: Progressing   Problem: Pain Managment: Goal: General experience of comfort will improve Outcome: Progressing   Problem: Safety: Goal: Ability to remain free from injury will improve Outcome: Progressing   Problem: Skin Integrity: Goal: Risk for impaired skin integrity will decrease Outcome: Progressing   Problem: Education: Goal: Ability to describe self-care measures that may prevent or decrease complications (Diabetes Survival Skills Education) will improve Outcome: Progressing Goal: Individualized Educational Video(s) Outcome: Progressing   Problem: Coping: Goal: Ability to adjust to condition or change in health will improve Outcome: Progressing   Problem: Fluid Volume: Goal: Ability to  maintain a balanced intake and output will improve Outcome: Progressing   Problem: Health Behavior/Discharge Planning: Goal: Ability to identify and utilize available resources and services will improve Outcome: Progressing Goal: Ability to manage health-related needs will improve Outcome: Progressing   Problem: Metabolic: Goal: Ability to maintain appropriate glucose levels will improve Outcome: Progressing   Problem: Nutritional: Goal: Maintenance of adequate nutrition will improve Outcome: Progressing Goal: Progress toward achieving an optimal weight will improve Outcome: Progressing   Problem: Skin Integrity: Goal: Risk for impaired skin integrity will decrease Outcome: Progressing   Problem: Tissue Perfusion: Goal: Adequacy of tissue perfusion will improve Outcome: Progressing   Problem: Education: Goal: Ability to demonstrate management of disease process will improve Outcome: Progressing Goal: Ability to verbalize understanding of medication therapies will improve Outcome: Progressing Goal: Individualized Educational Video(s) Outcome: Progressing   Problem: Activity: Goal: Capacity to carry out activities will improve Outcome: Progressing   Problem: Cardiac: Goal: Ability to achieve and maintain adequate cardiopulmonary perfusion will improve Outcome: Progressing   Problem: Education: Goal: Patient will understand all VAD equipment and how it functions Outcome: Progressing Goal: Patient will be able to verbalize current INR target range and antiplatelet therapy for discharge home Outcome: Progressing   Problem: Cardiac: Goal: LVAD will function as expected and patient will experience no clinical alarms Outcome: Progressing   

## 2022-06-14 NOTE — Op Note (Unsigned)
NAME: Kevin Underwood, Kevin Underwood MEDICAL RECORD NO: [id number] ACCOUNT NO: [id number] DATE OF BIRTH: Sep 09, 1961 FACILITY: [facility] LOCATION: [facility] PHYSICIAN: Kerin Perna III, MD  Operative Report   DATE OF PROCEDURE: 06/14/2022  OPERATION:   1.  Excisional surgical debridement of VAD power cord tunnel wound infection. 2.  Pulse lavage irrigation of the wound. 3.  Placement of wound VAC.  PREOPERATIVE DIAGNOSIS:  Abdominal VAD power cord tunnel infection after HeartMate 3 placement for ischemic cardiomyopathy in 2022 by Dr. Renaldo Fiddler.  POSTOPERATIVE DIAGNOSIS:  Abdominal VAD power cord tunnel infection after HeartMate 3 placement for ischemic cardiomyopathy in 2022 by Dr. Renaldo Fiddler.  SURGEON:  Kerin Perna III, MD  ANESTHESIA:  General.  DESCRIPTION OF PROCEDURE:  The patient was seen in preoperative holding where informed consent was documented and final issues were addressed with the patient regarding the procedure.  He had no further questions.  The patient was then transported to the  operating room and placed supine on the operating table.  General anesthesia was induced. For the entire operation and the time the patient spent in the operating room, and in the recovery room the patient was attended to by the VAD coordinator who  monitored the VAD equipment and hemodynamics and assisted with management of the patient's hemodynamics.  The previous wound packing was removed and the lower chest and abdomen were prepped and draped as a sterile field.  A proper timeout was performed.  I examined the wound.  There is a tunneled space around the power cord extending almost to the midline.  I made an incision in the skin and subcutaneous tissue to open that space.  There was purulent material, which was removed, which appeared to be  previously cultured several times for MRSA.  The wound was then irrigated with both pulse lavage saline and vancomycin hand injection irrigation.  The volar part of the  wound was covered with some fibrinous granulation tissue, which was scraped off using the curettes to healthy bleeding layer of tissue.  The depth of the wound went down to the level of the rectus muscle in the posterior rectus  sheath.  The more medial aspect of the wound that had just been opened was sharply debrided of some unhealthy tissue, which included the subcutaneous tissue and fascia.  The peritoneum was not entered.  Hemostasis was then achieved after tedious attention to the superficial bleeding sites.  When hemostasis was achieved, some Surgicel powder was placed at the depth of the wound and covered with Surgicel sheet.  Over this, a wound VAC sponge, which had  been cut to the appropriate size and configuration was placed.  The wound measured 12 cm long x 5 cm wide x 3.5 cm deep.  The power cord came out through the lower aspect of the sponge, which was tailored to fit the cord.  Over the sponge the layers of  sterile sheets were placed followed by a small opening in the suction line connected to the pump, which was placed at negative 125.  There was good compression of the sponge.  The patient was then reversed from anesthesia, extubated, and returned to  recovery room.   PUS D: 06/14/2022 1:42:31 pm T: 06/14/2022 8:20:00 pm  JOB: [id number]/ [id number]

## 2022-06-14 NOTE — Anesthesia Procedure Notes (Signed)
Procedure Name: Intubation Date/Time: 06/14/2022 7:44 AM  Performed by: Wilburn Cornelia, CRNAPre-anesthesia Checklist: Patient identified, Emergency Drugs available, Suction available, Patient being monitored and Timeout performed Patient Re-evaluated:Patient Re-evaluated prior to induction Oxygen Delivery Method: Circle system utilized Preoxygenation: Pre-oxygenation with 100% oxygen Induction Type: IV induction Ventilation: Mask ventilation without difficulty Laryngoscope Size: Mac and 4 Grade View: Grade I Tube type: Oral Tube size: 7.5 mm Number of attempts: 1 Airway Equipment and Method: Stylet Placement Confirmation: ETT inserted through vocal cords under direct vision, positive ETCO2, CO2 detector and breath sounds checked- equal and bilateral Secured at: 23 cm Tube secured with: Tape Dental Injury: Teeth and Oropharynx as per pre-operative assessment

## 2022-06-14 NOTE — Progress Notes (Signed)
Pharmacy Antibiotic Note  Kevin Underwood is a 61 y.o. male admitted on 06/12/2022 with LVAD Driveline infection s/p debridement today. Noted patient was on Daptomycin with CK increased up to 1000 today. Spoke with patient and he is willing to re-try vancomycin. Pharmacy has been consulted for vancomycin dosing. SCr currently 1.87 (noted recent AKI in June with SCr up to 4.63).   Plan: Vancomycin 1250 mg x 1 then 1000 mg every 24 hours  Predicted AUC 438  Monitor renal function closely F/U surgical plans   Height: 5\' 10"  (177.8 cm) Weight: 79.2 kg (174 lb 9.7 oz) IBW/kg (Calculated) : 73  Temp (24hrs), Avg:98 F (36.7 C), Min:97.4 F (36.3 C), Max:99.3 F (37.4 C)  Recent Labs  Lab 06/12/22 1230 06/13/22 0603 06/14/22 0318  WBC 10.5 9.4 8.4  CREATININE 1.75* 1.79* 1.87*    Estimated Creatinine Clearance: 42.8 mL/min (A) (by C-G formula based on SCr of 1.87 mg/dL (H)).    No Known Allergies    Thank you for allowing pharmacy to be a part of this patient's care.  08/15/22, PharmD, BCPS, BCIDP Infectious Diseases Clinical Pharmacist Phone: [phone] 06/14/2022 11:12 AM

## 2022-06-14 NOTE — Progress Notes (Signed)
Some called for report earlier.  Patient transported via bed to OR with OR staff and sent with antibiotic and chart.  Patient's CBG taken and 3 units given informed OR staff.  Patient was alert and oriented to person, place, time and situation.  He has LVAD cart and on battery backup at this time.  LVAD coordinator Kirt Boys called and informed patient in Room 37.
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
## 2022-06-14 NOTE — Brief Op Note (Signed)
06/14/2022  9:30 AM  PATIENT:  Kevin Underwood  61 y.o. male  PRE-OPERATIVE DIAGNOSIS:  VAD HM3 POWERCORD TUNNEL INFECTION  POST-OPERATIVE DIAGNOSIS:  VAD HM3 POWERCORD TUNNEL INFECTION  PROCEDURE:  Procedure(s): DEBRIDEMENT OF VAD POWERCORD TUNNEL (Left) APPLICATION OF WOUND VAC (Left) Excisional debridement of abdominal wall to rectus muscle Wound measurement 12cm x 5cm, 3 cm deep  SURGEON:  Surgeon(s) and Role:    Lovett Sox, MD - Primary  PHYSICIAN ASSISTANT:   ASSISTANTS: none   ANESTHESIA:   general  EBL:  5 mL   BLOOD ADMINISTERED:none  DRAINS: none   LOCAL MEDICATIONS USED:  NONE  SPECIMEN:  No Specimen  DISPOSITION OF SPECIMEN:  N/A  COUNTS:  YES  TOURNIQUET:  * No tourniquets in log *  DICTATION: .Dragon Dictation  PLAN OF CARE:  return to unit 2C  PATIENT DISPOSITION:  ICU - extubated and stable.   Delay start of Pharmacological VTE agent (>24hrs) due to surgical blood loss or risk of bleeding: resume low dose heparin at 2 pm 500 u/hr and slowly titrate up and watch for bleeding

## 2022-06-14 NOTE — TOC CM/SW Note (Signed)
HF TOC CM delivered papework and KCI wound vac to pt's room. Pt and wife reviewed and wife signed. Faxed delivery form to [employer], [place]. Isidoro Donning RN3 CCM, Heart Failure TOC CM [phone]

## 2022-06-14 NOTE — Progress Notes (Addendum)
ANTICOAGULATION CONSULT NOTE  Pharmacy Consult for heparin + warfarin Indication:  LVAD  No Known Allergies  Patient Measurements: Height: 5\' 10"  (177.8 cm) Weight: 79.2 kg (174 lb 9.7 oz) IBW/kg (Calculated) : 73 Heparin Dosing Weight: 80kg  Vital Signs: Temp: 97.4 F (36.3 C) (07/07 1023) Temp Source: Oral (07/07 1023) BP: 114/90 (07/07 1023) Pulse Rate: 66 (07/07 1023)  Labs: Recent Labs    06/12/22 1230 06/12/22 2121 06/12/22 2223 06/13/22 0603 06/14/22 0318  HGB 9.5*  --   --  8.9* 9.1*  HCT 28.6*  --   --  27.3* 27.6*  PLT 385  --   --  317 310  LABPROT 20.0*  --   --  16.4* 15.9*  INR 1.7*  --   --  1.3* 1.3*  HEPARINUNFRC  --    < > <0.10* <0.10* <0.10*  CREATININE 1.75*  --   --  1.79* 1.87*  CKTOTAL  --   --   --  897* 1,000*   < > = values in this interval not displayed.     Estimated Creatinine Clearance: 42.8 mL/min (A) (by C-G formula based on SCr of 1.87 mg/dL (H)).   Medical History: Past Medical History:  Diagnosis Date   "    Arthritis    CAD (coronary artery disease)    a. s/p CABG in 11/2019 with LIMA-LAD, SVG-OM1, SVG-PDA and SVG-D1   CHF (congestive heart failure) (HCC)    a. EF < 20% by echo in 11/2019   Essential hypertension    PAF (paroxysmal atrial fibrillation) (HCC)    Type 2 diabetes mellitus (HCC)     Assessment: 68 yoM with HM3 LVAD admitted with ongoing LVAD driveline infection. Pt with planned I&D in OR 7/7. Pt is on warfarin PTA with last dose taken 7/3.   INR subtherapeutic at 1.3 after holding for procedure. Heparin stopped preop this morning. Pt now s/p debridement. Per Dr. 9/7 - ok to resume IV heparin at low dose at 1400 this afternoon and ok to resume warfarin. Note - amiodarone infusion started for ectopy - will require lower warfarin dosing.  *Home warfarin dose 6mg  MWF, 4mg  AODs  Goal of Therapy:  Heparin level ~0.3 units/ml Monitor platelets by anticoagulation protocol: Yes   Plan:  -Warfarin 4mg   PO x1 -Resume heparin 500 units/h no bolus at 1400 -Daily INR, heparin level, CBC    Donata Clay, PharmD, [place], [facility] Clinical Pharmacist [phone] Please check AMION for all [facility] numbers 06/14/2022

## 2022-06-14 NOTE — Progress Notes (Signed)
LVAD Coordinator Rounding Note:  Admitted 06/12/22 to Dr. Alford Highland service due to drive line debridement scheduled with Dr Donata Clay 06/14/22.   HM III LVAD implanted on 11/17/20 by Dr. Vickey Sages under Destination Therapy criteria due to social issues and diabetes with HgbA1c 10.4.   Pt in holding room awaiting OR debridement of drive line.   Vital signs: Temp:  97.4 HR: 66 Doppler Pressure: 88 Automatic BP:  104/84 (91) O2 Sat: 100% on RA Wt: 171.7>174.6 lbs   LVAD interrogation reveals:  Speed: 5500 Flow:  4.1 Power:  4.0 w PI:  3.6 Alarms: none Events:  > 110 PI events on 06/13/22  Fixed speed:  5500 Low speed limit:  5200  Drive Line:  gauze dressing with brown drainage noted; pt scheduled for wound debridement in OR today per Dr. Maren Beach. Anchor intact and accurately applied.     Labs:  LDH trend: 249>244  INR trend: 1.3 >1.3  CK: 897>1000  Anticoagulation Plan: -INR Goal: 2.0 - 2.5 -ASA Dose: none  ICD: N/A  Drips:  Heparin - stopped this am prior to surgery  Infection:  06/07/22>> repeat drive line culture>> abundant MRSA; final  Plan/Recommendations:  Page VAD coordinators for equipment or drive line issues Plan for drive line debridement in OR per Dr Donata Clay today. VAD coordinator will accompany pt.   Hessie Diener RN VAD Coordinator  Office: [phone]  24/7 Pager: [phone]

## 2022-06-14 NOTE — Transfer of Care (Signed)
Immediate Anesthesia Transfer of Care Note  Patient: Kevin Underwood  Procedure(s) Performed: DEBRIDEMENT OF VAD POWERCORD TUNNEL (Left: Abdomen) APPLICATION OF WOUND VAC (Left: Abdomen)  Patient Location: PACU  Anesthesia Type:General  Level of Consciousness: awake, alert  and oriented  Airway & Oxygen Therapy: Patient Spontanous Breathing and Patient connected to nasal cannula oxygen  Post-op Assessment: Report given to RN and Post -op Vital signs reviewed and stable  Post vital signs: Reviewed and stable  Last Vitals:  Vitals Value Taken Time  BP 113/81(90) 06/14/22 0924  Temp    Pulse 161 06/14/22 0928  Resp 14 06/14/22 0928  SpO2 97 % 06/14/22 0928  Vitals shown include unvalidated device data.  Last Pain:  Vitals:   06/14/22 0323  TempSrc: Oral  PainSc: 0-No pain         Complications: No notable events documented.

## 2022-06-15 ENCOUNTER — Encounter (HOSPITAL_COMMUNITY): Payer: Self-pay | Admitting: Cardiothoracic Surgery

## 2022-06-15 DIAGNOSIS — Z95811 Presence of heart assist device: Secondary | ICD-10-CM

## 2022-06-15 DIAGNOSIS — T827XXA Infection and inflammatory reaction due to other cardiac and vascular devices, implants and grafts, initial encounter: Secondary | ICD-10-CM | POA: Diagnosis not present

## 2022-06-15 LAB — CBC
HCT: 24.1 % — ABNORMAL LOW (ref 39.0–52.0)
Hemoglobin: 7.5 g/dL — ABNORMAL LOW (ref 13.0–17.0)
MCH: 26.2 pg (ref 26.0–34.0)
MCHC: 31.1 g/dL (ref 30.0–36.0)
MCV: 84.3 fL (ref 80.0–100.0)
Platelets: 278 10*3/uL (ref 150–400)
RBC: 2.86 MIL/uL — ABNORMAL LOW (ref 4.22–5.81)
RDW: 16.1 % — ABNORMAL HIGH (ref 11.5–15.5)
WBC: 12.4 10*3/uL — ABNORMAL HIGH (ref 4.0–10.5)
nRBC: 0 % (ref 0.0–0.2)

## 2022-06-15 LAB — PROTIME-INR
INR: 1.2 (ref 0.8–1.2)
Prothrombin Time: 14.7 seconds (ref 11.4–15.2)

## 2022-06-15 LAB — GLUCOSE, CAPILLARY
Glucose-Capillary: 200 mg/dL — ABNORMAL HIGH (ref 70–99)
Glucose-Capillary: 327 mg/dL — ABNORMAL HIGH (ref 70–99)
Glucose-Capillary: 160 mg/dL — ABNORMAL HIGH (ref 70–99)
Glucose-Capillary: 233 mg/dL — ABNORMAL HIGH (ref 70–99)

## 2022-06-15 LAB — HEPARIN LEVEL (UNFRACTIONATED): Heparin Unfractionated: <0.1 IU/mL — ABNORMAL LOW (ref 0.30–0.70)

## 2022-06-15 LAB — BASIC METABOLIC PANEL
Anion gap: 11 (ref 5–15)
BUN: 29 mg/dL — ABNORMAL HIGH (ref 8–23)
CO2: 26 mmol/L (ref 22–32)
Calcium: 8.7 mg/dL — ABNORMAL LOW (ref 8.9–10.3)
Chloride: 98 mmol/L (ref 98–111)
Creatinine, Ser: 1.86 mg/dL — ABNORMAL HIGH (ref 0.61–1.24)
GFR, Estimated: 41 mL/min — ABNORMAL LOW (ref >60)
Glucose, Bld: 201 mg/dL — ABNORMAL HIGH (ref 70–99)
Potassium: 4 mmol/L (ref 3.5–5.1)
Sodium: 135 mmol/L (ref 135–145)

## 2022-06-15 LAB — LACTATE DEHYDROGENASE: LDH: 208 U/L — ABNORMAL HIGH (ref 98–192)

## 2022-06-15 LAB — CK: Total CK: 757 U/L — ABNORMAL HIGH (ref 49–397)

## 2022-06-15 MED ORDER — WARFARIN SODIUM 5 MG PO TABS
5.0000 mg | ORAL_TABLET | Freq: Once | ORAL | Status: AC
  Administered 2022-06-15: 5 mg via ORAL
  Filled 2022-06-15: qty 1

## 2022-06-15 NOTE — Progress Notes (Signed)
Advanced Heart Failure VAD Team Note  PCP-Cardiologist: Nona Dell, MD   Subjective:    S/p driveline site debridement and wound vac on 7/7.  Not complaining of pain this morning.    Crestor and daptomycin stopped w/ elevated CK level, no muscle pain.  CK 1000 => 757.  Now on vancomycin.  No nausea/vomiting so far.   SCr stable, 1.87  LVAD INTERROGATION:  HeartMate III LVAD:   Flow 4.2 liters/min, speed 5500, power 4, PI 3.1.  About 15 PI events over the last day.   Objective:    Vital Signs:   Temp:  [97.3 F (36.3 C)-98.6 F (37 C)] 97.9 F (36.6 C) (07/08 0757) Pulse Rate:  [62-74] 69 (07/08 0757) Resp:  [12-20] 20 (07/08 0757) BP: (84-122)/(69-96) 99/84 (07/08 0800) SpO2:  [94 %-100 %] 94 % (07/08 0757) Weight:  [81 kg] 81 kg (07/08 0624) Last BM Date : 06/12/22 Mean arterial Pressure 80s  Intake/Output:   Intake/Output Summary (Last 24 hours) at 06/15/2022 0831 Last data filed at 06/15/2022 0758 Gross per 24 hour  Intake 2353.5 ml  Output 2275 ml  Net 78.5 ml     Physical Exam    General: Well appearing this am. NAD.  HEENT: Normal. Neck: Supple, JVP 7-8 cm. Carotids OK.  Cardiac:  Mechanical heart sounds with LVAD hum present.  Lungs:  CTAB, normal effort.  Abdomen:  NT, ND, no HSM. No bruits or masses. +BS  LVAD exit site: Wound vac in place.  Extremities:  Warm and dry. No cyanosis, clubbing, rash, or edema.  Neuro:  Alert & oriented x 3. Cranial nerves grossly intact. Moves all 4 extremities w/o difficulty. Affect pleasant    Telemetry   SR 80s (personally reviewed)  Labs   Basic Metabolic Panel: Recent Labs  Lab 06/12/22 1230 06/13/22 0603 06/14/22 0318 06/15/22 0335  NA 135 137 137 135  K 3.9 4.2 3.8 4.0  CL 100 98 99 98  CO2 26 26 26 26   GLUCOSE 153* 163* 211* 201*  BUN 24* 29* 27* 29*  CREATININE 1.75* 1.79* 1.87* 1.86*  CALCIUM 8.4* 8.9 8.7* 8.7*    Liver Function Tests: Recent Labs  Lab 06/12/22 1230  AST 68*  ALT 86*   ALKPHOS 114  BILITOT 0.2*  PROT 6.9  ALBUMIN 2.7*   No results for input(s): "LIPASE", "AMYLASE" in the last 168 hours. No results for input(s): "AMMONIA" in the last 168 hours.  CBC: Recent Labs  Lab 06/12/22 1230 06/13/22 0603 06/14/22 0318 06/15/22 0335  WBC 10.5 9.4 8.4 12.4*  NEUTROABS 8.0*  --   --   --   HGB 9.5* 8.9* 9.1* 7.5*  HCT 28.6* 27.3* 27.6* 24.1*  MCV 82.2 81.0 82.6 84.3  PLT 385 317 310 278    INR: Recent Labs  Lab 06/12/22 1230 06/13/22 0603 06/14/22 0318 06/15/22 0335  INR 1.7* 1.3* 1.3* 1.2    Other results: EKG:    Imaging   CT ABDOMEN PELVIS WO CONTRAST  Result Date: 06/14/2022 CLINICAL DATA:  LVAD drive line infection EXAM: CT ABDOMEN AND PELVIS WITHOUT CONTRAST TECHNIQUE: Multidetector CT imaging of the abdomen and pelvis was performed following the standard protocol without IV contrast. RADIATION DOSE REDUCTION: This exam was performed according to the departmental dose-optimization program which includes automated exposure control, adjustment of the mA and/or kV according to patient size and/or use of iterative reconstruction technique. COMPARISON:  CT abdomen and pelvis dated June 04, 2022 FINDINGS: Lower chest: No acute  abnormality. Hepatobiliary: No focal liver abnormality is seen. No gallstones, gallbladder wall thickening, or biliary dilatation. Pancreas: Unremarkable. No pancreatic ductal dilatation or surrounding inflammatory changes. Spleen: Normal in size without focal abnormality. Adrenals/Urinary Tract: Bilateral adrenal glands are unremarkable. No hydronephrosis. Punctate nonobstructing stone of the upper pole the left kidney. Bladder is unremarkable. Stomach/Bowel: Stomach is within normal limits. Appendix appears normal. Diverticulosis. No evidence of bowel wall thickening, distention, or inflammatory changes. Vascular/Lymphatic: Aortic atherosclerosis. No enlarged abdominal or pelvic lymph nodes. Reproductive: Prostate is  unremarkable. Other: Soft tissue stranding and trace fluid seen about the LVAD drive line in the upper abdomen. Musculoskeletal: Prior posterior fusion of L4-S1. No aggressive osseous lesions. IMPRESSION: 1. Soft tissue stranding and trace fluid seen about the LVAD drive line in the upper abdomen, unchanged when compared with prior exam and compatible with history of drive line infection. 2. Punctate nonobstructing stone of the upper pole the left kidney. 3.  Aortic Atherosclerosis (ICD10-I70.0). Electronically Signed   By: Allegra Lai M.D.   On: 06/14/2022 08:21     Medications:     Scheduled Medications:  allopurinol  100 mg Oral Daily   Chlorhexidine Gluconate Cloth  6 each Topical Daily   docusate sodium  200 mg Oral BID   gabapentin  600 mg Oral BID   guaiFENesin  600 mg Oral BID   hydrALAZINE  25 mg Oral TID   insulin aspart  0-15 Units Subcutaneous TID WC   insulin aspart  0-5 Units Subcutaneous QHS   lactose free nutrition  237 mL Oral TID WC   loratadine  10 mg Oral Daily   methimazole  20 mg Oral BID   metoprolol succinate  25 mg Oral Daily   mupirocin ointment  1 Application Nasal BID   pantoprazole  40 mg Oral Daily   sertraline  50 mg Oral Daily   tamsulosin  0.4 mg Oral Daily   traZODone  150 mg Oral QHS   warfarin  5 mg Oral ONCE-1600   Warfarin - Pharmacist Dosing Inpatient   Does not apply q1600    Infusions:  heparin 500 Units/hr (06/15/22 0304)   vancomycin      PRN Medications: acetaminophen, albuterol, morphine injection, ondansetron (ZOFRAN) IV, traMADol   Patient Profile   61 y.o. male with history of HM III LVAD, chronic systolic CHF/ICM, COPD, PAF. 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   Readmitted for driveline infection and surgical debridement/possible wound vac placement.  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 S/p driveline site debridement 6/8. Surgical  wound cultures grew MRSA, repeat debridement 6/23. Driveline debridement with wound vac placement on 7/7.  - Transitioned from daptomycin to vancomycin due to elevated CK.  CK trending down, so far no problems with vancomycin. Vancomycin planned x 6 wks.  - Back to OR for wound vac change on Thursday.  3.  Chronic systolic CHF: Echo 10/21 with EF 20-25%, mildly decreased RV function. LHC/RHC in 12/21 with patent grafts, low output. Suspect mixed ischemic/nonischemic cardiomyopathy (prior heavy ETOH and drugs as well as CAD).  No ETOH, drugs, smoking since CABG in 12/20. Admitted with cardiogenic shock in 12/21, had placement of Impella 5.5 initially, now s/p Heartmate 3 LVAD on 11/17/20.  Ramp echo 2/23 with speed decreased to 5500 rpm. He would eventually like heart transplant. NYHA class II. Have been allowing MAP to run higher due to recent AKI thought to be due to  lowering BP too much in setting of right renal artery stenosis. MAP 80s. INR 1.2 today on warfarin/low dose heparin. LDH stable 218.  - Continue hydralazine 25 mg TID - Continue Toprol XL 25 mg daily.  - No diuretic. Does not appear volume overloaded.  - Continue warfarin/low dose heparin.  - ramp echo Monday.  4. AKI: Creatinine up to 4.6 last admission, initially thought this was ATN from low BP but he was never markedly hypotensive and LFTs without any evidence of shock liver. ? Vanc toxicity (troughs were ok). Taken off Entresto and hydralazine. Volume repleted with IVF. In reviewing prior CTA abdomen/pelvis, he has high-grade stenosis of proximal right renal artery. D/w Renal and feel like he may need higher perfusion pressures. Creatinine stable at 1.87 today.  - Would consider reassessment of renal artery stenosis and possible treatment once creatinine has improved to baseline.  - Watch MAPs, avoid hypotension 5. CAD: S/p CABG 12/20.  LHC pre-VAD with patent grafts, no target for intervention. - Stopped statin with elevated CK =>  this was in setting of daptomycin. Should be able to eventually restart statin but would allow CK to normalize first. 6. Atrial fibrillation: Paroxysmal.  S/p Maze and LA appendage clip with CABG in 12/20. Maintaining NSR.  - Continue Toprol XL 25 mg daily.  - Off amiodarone with hyperthyroidism. - Warfarin + heparin per above  7. Type 2 diabetes: Takes glipizide at home.  - SSI  8. 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  9. 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  He missed his endocrinology appt. Has been rescheduled for July 27th. - He is now off amiodarone.   - Continue methimazole 20 mg bid, TSH was mildly elevated.   10. Gout: On allopurinol.  11. Anemia: Hgb lower post-OR at 7.5.  - Transfuse hgb < 7.   Mobilize.   I reviewed the LVAD parameters from today, and compared the results to the patient's prior recorded data.  No programming changes were made.  The LVAD is functioning within specified parameters.  The patient performs LVAD self-test daily.  LVAD interrogation was negative for any significant power changes, alarms or PI events/speed drops.  LVAD equipment check completed and is in good working order.  Back-up equipment present.   LVAD education done on emergency procedures and precautions and reviewed exit site care.  Length of Stay: 3  Loralie Champagne, MD 06/15/2022, 8:31 AM  VAD Team --- VAD ISSUES ONLY--- Pager [phone] (7am - 7am)  Advanced Heart Failure Team  Pager [phone] (M-F; 7a - 5p)  Please contact [facility] for night-coverage after hours (5p -7a ) and weekends on [email or web address]

## 2022-06-15 NOTE — Progress Notes (Signed)
Mobility Specialist: Progress Note   06/15/22 1424  Mobility  Activity Ambulated independently in hallway  Level of Assistance Independent  Assistive Device None  Distance Ambulated (ft) 420 ft  Activity Response Tolerated well  $Mobility charge 1 Mobility   Received pt in bed having no complaints and agreeable to mobility. Pt was asymptomatic throughout ambulation and returned to room w/o fault. Left sitting EOB w/ call bell in reach and all needs met.  [facility] Katelind Pytel Mobility Specialist Mobility Specialist [address]: [phone]

## 2022-06-15 NOTE — Progress Notes (Addendum)
ANTICOAGULATION CONSULT NOTE  Pharmacy Consult for heparin + warfarin Indication:  LVAD  No Known Allergies  Patient Measurements: Height: 5\' 10"  (177.8 cm) Weight: 81 kg (178 lb 9.6 oz) IBW/kg (Calculated) : 73 Heparin Dosing Weight: 80kg  Vital Signs: Temp: 97.9 F (36.6 C) (07/08 0757) Temp Source: Oral (07/08 0757) BP: 99/84 (07/08 0757) Pulse Rate: 69 (07/08 0757)  Labs: Recent Labs    06/13/22 0603 06/14/22 0318 06/15/22 0335  HGB 8.9* 9.1* 7.5*  HCT 27.3* 27.6* 24.1*  PLT 317 310 278  LABPROT 16.4* 15.9* 14.7  INR 1.3* 1.3* 1.2  HEPARINUNFRC <0.10* <0.10* <0.10*  CREATININE 1.79* 1.87* 1.86*  CKTOTAL 897* 1,000* 757*     Estimated Creatinine Clearance: 43.1 mL/min (A) (by C-G formula based on SCr of 1.86 mg/dL (H)).   Medical History: Past Medical History:  Diagnosis Date   "    Arthritis    CAD (coronary artery disease)    a. s/p CABG in 11/2019 with LIMA-LAD, SVG-OM1, SVG-PDA and SVG-D1   CHF (congestive heart failure) (HCC)    a. EF < 20% by echo in 11/2019   Essential hypertension    PAF (paroxysmal atrial fibrillation) (HCC)    Type 2 diabetes mellitus (HCC)     Assessment: 53 yoM with HM3 LVAD admitted with ongoing LVAD driveline infection. Pt with planned I&D in OR 7/7. Pt is on warfarin PTA with last dose taken 7/3.   INR subtherapeutic at 1.2 after holding for procedure. Heparin restarted post debridement yesterday 7/7. No current plans to titrate heparin over the weekend. Level undetectable this morning as expected.   No bleeding issues noted however, hgb is down 9.1>7.5 this morning. Platelet and LDH stable.   Note - amiodarone infusion started for ectopy but now off.  *Home warfarin dose 6mg  MWF, 4mg  AODs  Goal of Therapy:  INR goal 2-2.5 Heparin level ~0.3 units/ml Monitor platelets by anticoagulation protocol: Yes   Plan:  -Warfarin 5mg  PO x1 -Continue heparin at 500 units/hr -Daily INR, heparin level, CBC -Back to OR next  week  9/7 PharmD., BCPS Clinical Pharmacist 06/15/2022 8:11 AM

## 2022-06-16 DIAGNOSIS — T827XXA Infection and inflammatory reaction due to other cardiac and vascular devices, implants and grafts, initial encounter: Secondary | ICD-10-CM | POA: Diagnosis not present

## 2022-06-16 LAB — CBC
HCT: 25.7 % — ABNORMAL LOW (ref 39.0–52.0)
Hemoglobin: 8 g/dL — ABNORMAL LOW (ref 13.0–17.0)
MCH: 26.2 pg (ref 26.0–34.0)
MCHC: 31.1 g/dL (ref 30.0–36.0)
MCV: 84.3 fL (ref 80.0–100.0)
Platelets: 294 10*3/uL (ref 150–400)
RBC: 3.05 MIL/uL — ABNORMAL LOW (ref 4.22–5.81)
RDW: 16.4 % — ABNORMAL HIGH (ref 11.5–15.5)
WBC: 10.1 10*3/uL (ref 4.0–10.5)
nRBC: 0 % (ref 0.0–0.2)

## 2022-06-16 LAB — HEPARIN LEVEL (UNFRACTIONATED): Heparin Unfractionated: <0.1 IU/mL — ABNORMAL LOW (ref 0.30–0.70)

## 2022-06-16 LAB — CK: Total CK: 598 U/L — ABNORMAL HIGH (ref 49–397)

## 2022-06-16 LAB — BASIC METABOLIC PANEL
Anion gap: 10 (ref 5–15)
BUN: 29 mg/dL — ABNORMAL HIGH (ref 8–23)
CO2: 25 mmol/L (ref 22–32)
Calcium: 8.8 mg/dL — ABNORMAL LOW (ref 8.9–10.3)
Chloride: 100 mmol/L (ref 98–111)
Creatinine, Ser: 1.77 mg/dL — ABNORMAL HIGH (ref 0.61–1.24)
GFR, Estimated: 43 mL/min — ABNORMAL LOW (ref >60)
Glucose, Bld: 237 mg/dL — ABNORMAL HIGH (ref 70–99)
Potassium: 4.5 mmol/L (ref 3.5–5.1)
Sodium: 135 mmol/L (ref 135–145)

## 2022-06-16 LAB — PROTIME-INR
INR: 1.1 (ref 0.8–1.2)
Prothrombin Time: 13.9 seconds (ref 11.4–15.2)

## 2022-06-16 LAB — GLUCOSE, CAPILLARY
Glucose-Capillary: 198 mg/dL — ABNORMAL HIGH (ref 70–99)
Glucose-Capillary: 279 mg/dL — ABNORMAL HIGH (ref 70–99)
Glucose-Capillary: 203 mg/dL — ABNORMAL HIGH (ref 70–99)
Glucose-Capillary: 171 mg/dL — ABNORMAL HIGH (ref 70–99)

## 2022-06-16 LAB — LACTATE DEHYDROGENASE: LDH: 242 U/L — ABNORMAL HIGH (ref 98–192)

## 2022-06-16 MED ORDER — WARFARIN SODIUM 3 MG PO TABS
6.0000 mg | ORAL_TABLET | Freq: Once | ORAL | Status: AC
Start: 2022-06-16 — End: 2022-06-16
  Administered 2022-06-16: 6 mg via ORAL
  Filled 2022-06-16: qty 2

## 2022-06-16 NOTE — Progress Notes (Signed)
ANTICOAGULATION CONSULT NOTE  Pharmacy Consult for heparin + warfarin Indication:  LVAD  No Known Allergies  Patient Measurements: Height: 5\' 10"  (177.8 cm) Weight: 83 kg (182 lb 15.7 oz) IBW/kg (Calculated) : 73 Heparin Dosing Weight: 80kg  Vital Signs: Temp: 97.6 F (36.4 C) (07/09 0343) Temp Source: Oral (07/09 0343) BP: 108/82 (07/09 0343) Pulse Rate: 70 (07/09 0343)  Labs: Recent Labs    06/14/22 0318 06/15/22 0335 06/16/22 0349  HGB 9.1* 7.5* 8.0*  HCT 27.6* 24.1* 25.7*  PLT 310 278 294  LABPROT 15.9* 14.7 13.9  INR 1.3* 1.2 1.1  HEPARINUNFRC <0.10* <0.10* <0.10*  CREATININE 1.87* 1.86* 1.77*  CKTOTAL 1,000* 757* 598*     Estimated Creatinine Clearance: 45.3 mL/min (A) (by C-G formula based on SCr of 1.77 mg/dL (H)).   Medical History: Past Medical History:  Diagnosis Date   "    Arthritis    CAD (coronary artery disease)    a. s/p CABG in 11/2019 with LIMA-LAD, SVG-OM1, SVG-PDA and SVG-D1   CHF (congestive heart failure) (HCC)    a. EF < 20% by echo in 11/2019   Essential hypertension    PAF (paroxysmal atrial fibrillation) (HCC)    Type 2 diabetes mellitus (HCC)     Assessment: 89 yoM with HM3 LVAD admitted with ongoing LVAD driveline infection. Pt with planned I&D in OR 7/7. Pt is on warfarin PTA with last dose taken 7/3.   INR subtherapeutic at 1.1 after holding for procedure. Heparin restarted post debridement 7/7 at fixed dose, heparin level undetectable. No current plans to titrate heparin over the weekend. Level undetectable this morning as expected.   No bleeding issues noted however, hgb improved to 8 this morning. Platelet and LDH stable.   *Home warfarin dose 6mg  MWF, 4mg  AODs  Goal of Therapy:  INR goal 2-2.5 Heparin level ~0.3 units/ml Monitor platelets by anticoagulation protocol: Yes   Plan:  -Warfarin 6 mg PO x1 -Continue heparin at 500 units/hr -Daily INR, heparin level, CBC -Back to OR next week  Thank you for allowing  pharmacy to participate in this patient's care.  9/7, PharmD PGY1 Pharmacy Resident 06/16/2022 7:37 AM Check [email or web address] for unit specific pharmacy number

## 2022-06-16 NOTE — Progress Notes (Signed)
Patient ID: KATIE MOCH, male   DOB: 04-27-1961, 61 y.o.   MRN: [id number]   Advanced Heart Failure VAD Team Note  PCP-Cardiologist: Nona Dell, MD   Subjective:    S/p driveline site debridement and wound vac on 7/7.  Not complaining of pain this morning.    Crestor and daptomycin stopped w/ elevated CK level, no muscle pain.  CK 1000 => 757 => 598.  Now on vancomycin.  No nausea/vomiting so far.   SCr stable, 1.87 => 1.77  LVAD INTERROGATION:  HeartMate III LVAD:   Flow 4.3 liters/min, speed 5500, power 4, PI 2.8.    Objective:    Vital Signs:   Temp:  [97.3 F (36.3 C)-98.4 F (36.9 C)] 97.3 F (36.3 C) (07/09 0747) Pulse Rate:  [68-80] 80 (07/09 0747) Resp:  [14-20] 14 (07/09 0747) BP: (96-113)/(76-86) 104/76 (07/09 0747) SpO2:  [96 %-100 %] 100 % (07/09 0747) Weight:  [83 kg] 83 kg (07/09 0400) Last BM Date : 06/14/22 Mean arterial Pressure 80s - 90s  Intake/Output:   Intake/Output Summary (Last 24 hours) at 06/16/2022 0858 Last data filed at 06/16/2022 0800 Gross per 24 hour  Intake 1853.65 ml  Output 2610 ml  Net -756.35 ml     Physical Exam    General: Well appearing this am. NAD.  HEENT: Normal. Neck: Supple, JVP 7-8 cm. Carotids OK.  Cardiac:  Mechanical heart sounds with LVAD hum present.  Lungs:  CTAB, normal effort.  Abdomen:  NT, ND, no HSM. No bruits or masses. +BS  LVAD exit site: Well-healed and incorporated. Dressing dry and intact. No erythema or drainage. Stabilization device present and accurately applied. Driveline dressing changed daily per sterile technique. Extremities:  Warm and dry. No cyanosis, clubbing, rash, or edema.  Neuro:  Alert & oriented x 3. Cranial nerves grossly intact. Moves all 4 extremities w/o difficulty. Affect pleasant     Telemetry   SR 80s (personally reviewed)  Labs   Basic Metabolic Panel: Recent Labs  Lab 06/12/22 1230 06/13/22 0603 06/14/22 0318 06/15/22 0335 06/16/22 0349  NA 135 137 137 135  135  K 3.9 4.2 3.8 4.0 4.5  CL 100 98 99 98 100  CO2 26 26 26 26 25   GLUCOSE 153* 163* 211* 201* 237*  BUN 24* 29* 27* 29* 29*  CREATININE 1.75* 1.79* 1.87* 1.86* 1.77*  CALCIUM 8.4* 8.9 8.7* 8.7* 8.8*    Liver Function Tests: Recent Labs  Lab 06/12/22 1230  AST 68*  ALT 86*  ALKPHOS 114  BILITOT 0.2*  PROT 6.9  ALBUMIN 2.7*   No results for input(s): "LIPASE", "AMYLASE" in the last 168 hours. No results for input(s): "AMMONIA" in the last 168 hours.  CBC: Recent Labs  Lab 06/12/22 1230 06/13/22 0603 06/14/22 0318 06/15/22 0335 06/16/22 0349  WBC 10.5 9.4 8.4 12.4* 10.1  NEUTROABS 8.0*  --   --   --   --   HGB 9.5* 8.9* 9.1* 7.5* 8.0*  HCT 28.6* 27.3* 27.6* 24.1* 25.7*  MCV 82.2 81.0 82.6 84.3 84.3  PLT 385 317 310 278 294    INR: Recent Labs  Lab 06/12/22 1230 06/13/22 0603 06/14/22 0318 06/15/22 0335 06/16/22 0349  INR 1.7* 1.3* 1.3* 1.2 1.1    Other results: EKG:    Imaging   No results found.   Medications:     Scheduled Medications:  allopurinol  100 mg Oral Daily   Chlorhexidine Gluconate Cloth  6 each Topical Daily  docusate sodium  200 mg Oral BID   gabapentin  600 mg Oral BID   guaiFENesin  600 mg Oral BID   hydrALAZINE  25 mg Oral TID   insulin aspart  0-15 Units Subcutaneous TID WC   insulin aspart  0-5 Units Subcutaneous QHS   lactose free nutrition  237 mL Oral TID WC   loratadine  10 mg Oral Daily   methimazole  20 mg Oral BID   metoprolol succinate  25 mg Oral Daily   mupirocin ointment  1 Application Nasal BID   pantoprazole  40 mg Oral Daily   sertraline  50 mg Oral Daily   tamsulosin  0.4 mg Oral Daily   traZODone  150 mg Oral QHS   warfarin  6 mg Oral ONCE-1600   Warfarin - Pharmacist Dosing Inpatient   Does not apply q1600    Infusions:  heparin 500 Units/hr (06/16/22 0400)   vancomycin Stopped (06/15/22 2228)    PRN Medications: acetaminophen, albuterol, morphine injection, ondansetron (ZOFRAN) IV,  traMADol   Patient Profile   61 y.o. male with history of HM III LVAD, chronic systolic CHF/ICM, COPD, PAF. 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   Readmitted for driveline infection and surgical debridement/possible wound vac placement.  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 S/p driveline site debridement 6/8. Surgical wound cultures grew MRSA, repeat debridement 6/23. Driveline debridement with wound vac placement on 7/7.  - Transitioned from daptomycin to vancomycin due to elevated CK.  CK trending down, so far no problems with vancomycin. Vancomycin planned x 6 wks.  - Back to OR for wound vac change on Thursday.  3.  Chronic systolic CHF: Echo 10/21 with EF 20-25%, mildly decreased RV function. LHC/RHC in 12/21 with patent grafts, low output. Suspect mixed ischemic/nonischemic cardiomyopathy (prior heavy ETOH and drugs as well as CAD).  No ETOH, drugs, smoking since CABG in 12/20. Admitted with cardiogenic shock in 12/21, had placement of Impella 5.5 initially, now s/p Heartmate 3 LVAD on 11/17/20.  Ramp echo 2/23 with speed decreased to 5500 rpm. He would eventually like heart transplant. NYHA class II. Have been allowing MAP to run higher due to recent AKI thought to be due to lowering BP too much in setting of right renal artery stenosis. MAP 80s-90s. INR 1.1 today on warfarin/low dose heparin. LDH stable 242.  - Continue hydralazine 25 mg TID - Continue Toprol XL 25 mg daily.  - No diuretic. Does not appear volume overloaded.  - Continue warfarin/low dose heparin.  - ramp echo Monday.  4. AKI: Creatinine up to 4.6 last admission, initially thought this was ATN from low BP but he was never markedly hypotensive and LFTs without any evidence of shock liver. ? Vanc toxicity (troughs were ok). Taken off Entresto and hydralazine. Volume repleted with IVF. In reviewing prior CTA abdomen/pelvis, he has high-grade  stenosis of proximal right renal artery. D/w Renal and feel like he may need higher perfusion pressures. Creatinine stable at 1.87 today.  - Would consider reassessment of renal artery stenosis and possible treatment once creatinine has improved to baseline.  - Watch MAPs, avoid hypotension 5. CAD: S/p CABG 12/20.  LHC pre-VAD with patent grafts, no target for intervention. - Stopped statin with elevated CK => this was in setting of daptomycin. Should be able to eventually restart statin but would allow CK to normalize first. 6. Atrial fibrillation: Paroxysmal.  S/p Maze and  LA appendage clip with CABG in 12/20. Maintaining NSR.  - Continue Toprol XL 25 mg daily.  - Off amiodarone with hyperthyroidism. - Warfarin + heparin per above  7. Type 2 diabetes: Takes glipizide at home.  - SSI  8. 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  9. 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  He missed his endocrinology appt. Has been rescheduled for July 27th. - He is now off amiodarone.   - Continue methimazole 20 mg bid, TSH was mildly elevated.   10. Gout: On allopurinol.  11. Anemia: Hgb 8 today.  - Transfuse hgb < 7.   OK to walk outside with his wife.   I reviewed the LVAD parameters from today, and compared the results to the patient's prior recorded data.  No programming changes were made.  The LVAD is functioning within specified parameters.  The patient performs LVAD self-test daily.  LVAD interrogation was negative for any significant power changes, alarms or PI events/speed drops.  LVAD equipment check completed and is in good working order.  Back-up equipment present.   LVAD education done on emergency procedures and precautions and reviewed exit site care.  Length of Stay: 4  Loralie Champagne, MD 06/16/2022, 8:58 AM  VAD Team --- VAD ISSUES ONLY--- Pager [phone] (7am - 7am)  Advanced Heart Failure Team  Pager [phone] (M-F; 7a - 5p)  Please contact  [facility] for night-coverage after hours (5p -7a ) and weekends on [email or web address]

## 2022-06-17 ENCOUNTER — Inpatient Hospital Stay (HOSPITAL_COMMUNITY): Payer: 59

## 2022-06-17 DIAGNOSIS — I5021 Acute systolic (congestive) heart failure: Secondary | ICD-10-CM | POA: Diagnosis not present

## 2022-06-17 DIAGNOSIS — T827XXA Infection and inflammatory reaction due to other cardiac and vascular devices, implants and grafts, initial encounter: Secondary | ICD-10-CM | POA: Diagnosis not present

## 2022-06-17 LAB — BASIC METABOLIC PANEL
Anion gap: 10 (ref 5–15)
BUN: 27 mg/dL — ABNORMAL HIGH (ref 8–23)
CO2: 26 mmol/L (ref 22–32)
Calcium: 8.8 mg/dL — ABNORMAL LOW (ref 8.9–10.3)
Chloride: 100 mmol/L (ref 98–111)
Creatinine, Ser: 1.65 mg/dL — ABNORMAL HIGH (ref 0.61–1.24)
GFR, Estimated: 47 mL/min — ABNORMAL LOW (ref >60)
Glucose, Bld: 215 mg/dL — ABNORMAL HIGH (ref 70–99)
Potassium: 4.3 mmol/L (ref 3.5–5.1)
Sodium: 136 mmol/L (ref 135–145)

## 2022-06-17 LAB — LACTATE DEHYDROGENASE: LDH: 229 U/L — ABNORMAL HIGH (ref 98–192)

## 2022-06-17 LAB — PROTIME-INR
INR: 1.1 (ref 0.8–1.2)
Prothrombin Time: 13.9 seconds (ref 11.4–15.2)

## 2022-06-17 LAB — CBC
HCT: 26 % — ABNORMAL LOW (ref 39.0–52.0)
Hemoglobin: 8.2 g/dL — ABNORMAL LOW (ref 13.0–17.0)
MCH: 26.6 pg (ref 26.0–34.0)
MCHC: 31.5 g/dL (ref 30.0–36.0)
MCV: 84.4 fL (ref 80.0–100.0)
Platelets: 291 10*3/uL (ref 150–400)
RBC: 3.08 MIL/uL — ABNORMAL LOW (ref 4.22–5.81)
RDW: 16.8 % — ABNORMAL HIGH (ref 11.5–15.5)
WBC: 9 10*3/uL (ref 4.0–10.5)
nRBC: 0 % (ref 0.0–0.2)

## 2022-06-17 LAB — TYPE AND SCREEN
ABO/RH(D): B POS
Antibody Screen: NEGATIVE
Unit division: 0

## 2022-06-17 LAB — GLUCOSE, CAPILLARY
Glucose-Capillary: 206 mg/dL — ABNORMAL HIGH (ref 70–99)
Glucose-Capillary: 167 mg/dL — ABNORMAL HIGH (ref 70–99)
Glucose-Capillary: 216 mg/dL — ABNORMAL HIGH (ref 70–99)
Glucose-Capillary: 270 mg/dL — ABNORMAL HIGH (ref 70–99)

## 2022-06-17 LAB — VANCOMYCIN, PEAK: Vancomycin Pk: 34 ug/mL (ref 30–40)

## 2022-06-17 LAB — BPAM RBC
Blood Product Expiration Date: 202308022359
Unit Type and Rh: 7300

## 2022-06-17 LAB — ECHOCARDIOGRAM LIMITED
Height: 70 in
Weight: 2899.49 oz

## 2022-06-17 LAB — HEPARIN LEVEL (UNFRACTIONATED): Heparin Unfractionated: <0.1 IU/mL — ABNORMAL LOW (ref 0.30–0.70)

## 2022-06-17 MED ORDER — INSULIN DETEMIR 100 UNIT/ML ~~LOC~~ SOLN
12.0000 [IU] | Freq: Every day | SUBCUTANEOUS | Status: DC
  Administered 2022-06-17: 12 [IU] via SUBCUTANEOUS
  Filled 2022-06-17 (×2): qty 0.12

## 2022-06-17 MED ORDER — WARFARIN SODIUM 3 MG PO TABS
6.0000 mg | ORAL_TABLET | Freq: Once | ORAL | Status: AC
  Administered 2022-06-17: 6 mg via ORAL
  Filled 2022-06-17: qty 2

## 2022-06-17 NOTE — Progress Notes (Signed)
ANTICOAGULATION CONSULT NOTE  Pharmacy Consult for heparin + warfarin Indication:  LVAD  No Known Allergies  Patient Measurements: Height: 5\' 10"  (177.8 cm) Weight: 82.2 kg (181 lb 3.5 oz) IBW/kg (Calculated) : 73 Heparin Dosing Weight: 80kg  Vital Signs: Temp: 98.6 F (37 C) (07/10 0758) Temp Source: Oral (07/10 0758) BP: 104/93 (07/10 0930) Pulse Rate: 76 (07/10 0930)  Labs: Recent Labs    06/15/22 0335 06/16/22 0349 06/17/22 0400  HGB 7.5* 8.0* 8.2*  HCT 24.1* 25.7* 26.0*  PLT 278 294 291  LABPROT 14.7 13.9 13.9  INR 1.2 1.1 1.1  HEPARINUNFRC <0.10* <0.10* <0.10*  CREATININE 1.86* 1.77* 1.65*  CKTOTAL 757* 598*  --      Estimated Creatinine Clearance: 48.5 mL/min (A) (by C-G formula based on SCr of 1.65 mg/dL (H)).   Medical History: Past Medical History:  Diagnosis Date   "    Arthritis    CAD (coronary artery disease)    a. s/p CABG in 11/2019 with LIMA-LAD, SVG-OM1, SVG-PDA and SVG-D1   CHF (congestive heart failure) (HCC)    a. EF < 20% by echo in 11/2019   Essential hypertension    PAF (paroxysmal atrial fibrillation) (HCC)    Type 2 diabetes mellitus (HCC)     Assessment: 28 yoM with HM3 LVAD admitted with ongoing LVAD driveline infection. Pt with planned I&D in OR 7/7. Pt is on warfarin PTA with last dose taken 7/3.   INR remains subtherapeutic at 1.1, CBC and LDH stable. Heparin level <0.1 as expected. Discussed with Dr. 9/7 - ok to continue warfarin with upcoming wound vac change, keep INR 2.2 or less.  *Home warfarin dose 6mg  MWF, 4mg  AODs  Goal of Therapy:  INR goal 2-2.5 Heparin level ~0.3 units/ml Monitor platelets by anticoagulation protocol: Yes   Plan:  -Warfarin 6 mg PO x1 tonight -Continue heparin at 500 units/hr -Daily INR, heparin level, CBC, LDH   Donata Clay, PharmD, [place], [facility] Clinical Pharmacist [phone] Please check AMION for all [facility] Pharmacy numbers 06/17/2022

## 2022-06-17 NOTE — Progress Notes (Signed)
LVAD Coordinator Rounding Note:  Admitted 06/12/22 to Dr. Alford Highland service due to drive line debridement scheduled with Dr Donata Clay 06/14/22.   Pt sitting up in bed. Voices complaints about VAD equipment/patient cable being "a mess" and feeling "tied to bed". Says he straightens them out and nurses come in and re-arrange and "make a mess".  He also complains of being on diabetic diet. Explained importance of keeping blood sugars controlled to help with wound healing. Also stressed importance of protein intake to help with wound healing, giving examples of acceptable protein sources included in his diet. Pt verbalized understanding of same.   Vital signs: Temp:  98.6 HR: 82 Doppler Pressure: not documented Automatic BP: 109/95 (102) O2 Sat: 94% on RA Wt: 171.7>174.6>178.6>182.9>181 lbs   LVAD interrogation reveals:  Speed: 5500 Flow:  4.0 Power:  4.1w PI:  4.3 Alarms: none Events:  >60 PI events on 06/16/22  Fixed speed:  5500 Low speed limit:  5200  Drive Line:  Wound vac in place with 125 suction; no leaks noted; draining serosanguinous fluid. Anchor intact to drive line. Planned wound vac change Thursday, 06/20/22 per Dr. Donata Clay.   Labs:  LDH trend: 249>244>208>22>229  INR trend: 1.3 >1.3>1.2>1.1>1.1  Anticoagulation Plan: -INR Goal: 2.0 - 2.5 -ASA Dose: none  ICD: N/A  Drips:  Heparin - 500 units/hr  Infection:  06/07/22>> repeat drive line culture>> abundant MRSA; final  Plan/Recommendations:  Page VAD coordinators for equipment or drive line issues Plan for drive line debridement in OR per Dr Donata Clay Thursday, 06/20/22. VAD coordinator will accompany pt.   Hessie Diener RN VAD Coordinator  Office: [phone]  24/7 Pager: [phone]

## 2022-06-17 NOTE — Progress Notes (Signed)
3 Days Post-Op Procedure(s) (LRB): DEBRIDEMENT OF VAD POWERCORD TUNNEL (Left) APPLICATION OF WOUND VAC (Left) Subjective: Abdominal wall pain improved Minimal alarms from VAC cannister, serosanguinous fluid being pulled out- no bleeding problems following wound debridement 06-14-22 Hb 8.2, WBC normal  Objective: Vital signs in last 24 hours: Temp:  [97.6 F (36.4 C)-98.6 F (37 C)] 98.6 F (37 C) (07/10 0758) Pulse Rate:  [80-86] 82 (07/10 0354) Cardiac Rhythm: Normal sinus rhythm (07/09 1900) Resp:  [14-20] 20 (07/10 0758) BP: (95-133)/(82-95) 109/95 (07/10 0758) SpO2:  [94 %-100 %] 94 % (07/10 0354) Weight:  [82.2 kg] 82.2 kg (07/10 0627)  Hemodynamic parameters for last 24 hours:  stable  Intake/Output from previous day: 07/09 0701 - 07/10 0700 In: 1397.7 [P.O.:1080; I.V.:117.7; IV Piggyback:200] Out: 2810 [Urine:2800; Drains:10] Intake/Output this shift: No intake/output data recorded.  Exam VAC sponge compressed, VAC dressing intact Normal VAD hum  Lab Results: Recent Labs    06/16/22 0349 06/17/22 0400  WBC 10.1 9.0  HGB 8.0* 8.2*  HCT 25.7* 26.0*  PLT 294 291   BMET:  Recent Labs    06/16/22 0349 06/17/22 0400  NA 135 136  K 4.5 4.3  CL 100 100  CO2 25 26  GLUCOSE 237* 215*  BUN 29* 27*  CREATININE 1.77* 1.65*  CALCIUM 8.8* 8.8*    PT/INR:  Recent Labs    06/17/22 0400  LABPROT 13.9  INR 1.1   ABG    Component Value Date/Time   PHART 7.508 (H) 11/20/2020 0438   HCO3 24.8 09/09/2021 0313   TCO2 29 11/20/2020 0438   ACIDBASEDEF 6.0 (H) 11/24/2019 1718   O2SAT 63.4 09/09/2021 0313   CBG (last 3)  Recent Labs    06/16/22 1528 06/16/22 2131 06/17/22 0623  GLUCAP 203* 171* 206*    Assessment/Plan: S/P Procedure(s) (LRB): DEBRIDEMENT OF VAD POWERCORD TUNNEL (Left) APPLICATION OF WOUND VAC (Left) Wound VAC therapy of power cord tunnel wound infection working well so far-  wound inspection with probable VAC change planned thurs pm.  Patient understands plan.He would not be capable  of safely handling both VAD and VAC at home.   LOS: 5 days    Lovett Sox 06/17/2022
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
## 2022-06-17 NOTE — Progress Notes (Addendum)
Patient ID: Kevin Underwood, male   DOB: 1961-04-27, 61 y.o.   MRN: [id number]   Advanced Heart Failure VAD Team Note  PCP-Cardiologist: Nona Dell, MD   Subjective:    S/p driveline site debridement and wound vac on 7/7.   On heparin drip. INR 1.1   Crestor and daptomycin stopped w/ elevated CK level, no muscle pain.  CK 1000 => 757 => 598.  Now on vancomycin.    SCr stable, 1.87 => 1.77=>1.65  Denies SOB. Denies N/V  LVAD INTERROGATION:  HeartMate III LVAD:   Flow 3.9 liters/min, speed 5500, power 4, PI 4.2   < 20 PI events   Objective:    Vital Signs:   Temp:  [97.3 F (36.3 C)-98.5 F (36.9 C)] 98.5 F (36.9 C) (07/10 0354) Pulse Rate:  [80-86] 82 (07/10 0354) Resp:  [14-20] 14 (07/10 0354) BP: (95-133)/(76-95) 133/89 (07/10 0354) SpO2:  [94 %-100 %] 94 % (07/10 0354) Weight:  [82.2 kg] 82.2 kg (07/10 0627) Last BM Date : 06/14/22 Mean arterial Pressure  90-100s  Intake/Output:   Intake/Output Summary (Last 24 hours) at 06/17/2022 0700 Last data filed at 06/17/2022 0354 Gross per 24 hour  Intake 1397.68 ml  Output 2810 ml  Net -1412.32 ml     Physical Exam    Physical Exam: GENERAL: No acute distress. Sitting on the side of the bed.  HEENT: normal  NECK: Supple, JVP 5-6  .  2+ bilaterally, no bruits.  No lymphadenopathy or thyromegaly appreciated.   CARDIAC:  Mechanical heart sounds with LVAD hum present.  LUNGS:  Clear to auscultation bilaterally.  ABDOMEN:  Soft, round, nontender, positive bowel sounds x4.     LVAD exit site: [facility] Dressing dry and intact.  No erythema or drainage.  Driveline dressing is being changed daily per sterile technique. EXTREMITIES:  Warm and dry, no cyanosis, clubbing, rash or edema . RUE single PICC  NEUROLOGIC:  Alert and oriented x 3.    No aphasia.  No dysarthria.  Affect pleasant.      Telemetry   SR 80s personally checked.   Labs   Basic Metabolic Panel: Recent Labs  Lab 06/13/22 0603 06/14/22 0318  06/15/22 0335 06/16/22 0349 06/17/22 0400  NA 137 137 135 135 136  K 4.2 3.8 4.0 4.5 4.3  CL 98 99 98 100 100  CO2 26 26 26 25 26   GLUCOSE 163* 211* 201* 237* 215*  BUN 29* 27* 29* 29* 27*  CREATININE 1.79* 1.87* 1.86* 1.77* 1.65*  CALCIUM 8.9 8.7* 8.7* 8.8* 8.8*    Liver Function Tests: Recent Labs  Lab 06/12/22 1230  AST 68*  ALT 86*  ALKPHOS 114  BILITOT 0.2*  PROT 6.9  ALBUMIN 2.7*   No results for input(s): "LIPASE", "AMYLASE" in the last 168 hours. No results for input(s): "AMMONIA" in the last 168 hours.  CBC: Recent Labs  Lab 06/12/22 1230 06/13/22 0603 06/14/22 0318 06/15/22 0335 06/16/22 0349 06/17/22 0400  WBC 10.5 9.4 8.4 12.4* 10.1 9.0  NEUTROABS 8.0*  --   --   --   --   --   HGB 9.5* 8.9* 9.1* 7.5* 8.0* 8.2*  HCT 28.6* 27.3* 27.6* 24.1* 25.7* 26.0*  MCV 82.2 81.0 82.6 84.3 84.3 84.4  PLT 385 317 310 278 294 291    INR: Recent Labs  Lab 06/13/22 0603 06/14/22 0318 06/15/22 0335 06/16/22 0349 06/17/22 0400  INR 1.3* 1.3* 1.2 1.1 1.1    Other results: EKG:  Imaging   No results found.   Medications:     Scheduled Medications:  allopurinol  100 mg Oral Daily   Chlorhexidine Gluconate Cloth  6 each Topical Daily   docusate sodium  200 mg Oral BID   gabapentin  600 mg Oral BID   guaiFENesin  600 mg Oral BID   hydrALAZINE  25 mg Oral TID   insulin aspart  0-15 Units Subcutaneous TID WC   insulin aspart  0-5 Units Subcutaneous QHS   lactose free nutrition  237 mL Oral TID WC   loratadine  10 mg Oral Daily   methimazole  20 mg Oral BID   metoprolol succinate  25 mg Oral Daily   mupirocin ointment  1 Application Nasal BID   pantoprazole  40 mg Oral Daily   sertraline  50 mg Oral Daily   tamsulosin  0.4 mg Oral Daily   traZODone  150 mg Oral QHS   Warfarin - Pharmacist Dosing Inpatient   Does not apply q1600    Infusions:  heparin 500 Units/hr (06/17/22 0354)   vancomycin 1,000 mg (06/16/22 1945)    PRN  Medications: acetaminophen, albuterol, morphine injection, ondansetron (ZOFRAN) IV, traMADol   Patient Profile   61 y.o. male with history of HM III LVAD, chronic systolic CHF/ICM, COPD, PAF. 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   Readmitted for driveline infection and surgical debridement/possible wound vac placement.  Assessment/Plan:    1. Driveline infection: Recent driveline infection [id number]. S/p driveline site debridement 6/8. Surgical wound cultures grew MRSA, repeat debridement 6/23. Driveline debridement with wound vac placement on 7/7.  - Transitioned from daptomycin to vancomycin due to elevated CK.  CK trending down, so far no problems with vancomycin. Vancomycin planned x 6 wks.  - Back to OR for wound vac change on Thursday.  - On heparin drip/ INR 1.1 3.  Chronic systolic CHF: Echo [id number] with EF 20-25%, mildly decreased RV function. LHC/RHC in 12/21 with patent grafts, low output. Suspect mixed ischemic/nonischemic cardiomyopathy (prior heavy ETOH and drugs as well as CAD).  No ETOH, drugs, smoking since CABG in 12/20. Admitted with cardiogenic shock in 12/21, had placement of Impella 5.5 initially, now s/p Heartmate 3 LVAD on 11/17/20.  Ramp echo 2/23 with speed decreased to 5500 rpm. He would eventually like heart transplant. NYHA class II. Have been allowing MAP to run higher due to recent AKI thought to be due to lowering BP too much in setting of right renal artery stenosis.  MAP 80s-90s. INR 1.1 today on warfarin/low dose heparin. LDH stable 229 - Continue hydralazine 25 mg TID - Continue Toprol XL 25 mg daily.  - No diuretic. Does not appear volume overloaded.  - Continue warfarin/low dose heparin.  - ramp echo  today.   4. AKI: Creatinine up to 4.6 last admission, initially thought this was ATN from low BP but he was never markedly hypotensive and LFTs without any evidence of shock liver. ? Vanc toxicity (troughs were ok).  Taken off Entresto and hydralazine. Volume repleted with IVF. In reviewing prior CTA abdomen/pelvis, he has high-grade stenosis of proximal right renal artery. D/w Renal and feel like he may need higher perfusion pressures. Creatinine stable at 1.65 today.  - Would consider reassessment of renal artery stenosis and possible treatment once creatinine has improved to baseline.  - Watch MAPs, avoid hypotension 5. CAD: S/p CABG 12/20.  LHC pre-VAD with patent grafts, no target for intervention. -  Stopped statin with elevated CK => this was in setting of daptomycin. Should be able to eventually restart statin but would allow CK to normalize first. 6. Atrial fibrillation: Paroxysmal.  S/p Maze and LA appendage clip with CABG in 12/20. Maintaining NSR.  - Continue Toprol XL 25 mg daily.  - Off amiodarone with hyperthyroidism. - Warfarin + heparin per above  7. Type 2 diabetes: Takes glipizide at home.  - SSI  8. 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  9. 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  He missed his endocrinology appt. Has been rescheduled for July 27th. - He is now off amiodarone.   - Continue methimazole 20 mg bid, TSH was mildly elevated.   10. Gout: On allopurinol.  11. Anemia: Hgb 8.2 today.  - Transfuse hgb < 7.    Ramp ECHO later today.  TOC CM for HH.   I reviewed the LVAD parameters from today, and compared the results to the patient's prior recorded data.  No programming changes were made.  The LVAD is functioning within specified parameters.  The patient performs LVAD self-test daily.  LVAD interrogation was negative for any significant power changes, alarms or PI events/speed drops.  LVAD equipment check completed and is in good working order.  Back-up equipment present.   LVAD education done on emergency procedures and precautions and reviewed exit site care.  Length of Stay: 5  Amy Clegg, NP 06/17/2022, 7:00 AM  VAD Team ---  VAD ISSUES ONLY--- Pager [phone] (7am - 7am)  Advanced Heart Failure Team  Pager [phone] (M-F; 7a - 5p)  Please contact [facility] for night-coverage after hours (5p -7a ) and weekends on [email or web address]   Patient seen with NP, agree with the above note.   No complaints this morning.  LVAD parameters stable.  MAP mildly elevated 90s generally. Creatinine lower at 1.65.   General: Well appearing this am. NAD.  HEENT: Normal. Neck: Supple, JVP 7-8 cm. Carotids OK.  Cardiac:  Mechanical heart sounds with LVAD hum present.  Lungs:  CTAB, normal effort.  Abdomen:  NT, ND, no HSM. No bruits or masses. +BS  LVAD exit site: Well-healed and incorporated. Dressing dry and intact. No erythema or drainage. Stabilization device present and accurately applied. Driveline dressing changed daily per sterile technique. Extremities:  Warm and dry. No cyanosis, clubbing, rash, or edema.  Neuro:  Alert & oriented x 3. Cranial nerves grossly intact. Moves all 4 extremities w/o difficulty. Affect pleasant    Continue warfarin/heparin overlap with INR 1.1 today.    Plan to return to OR for wound vac change on Thursday, likely home soon after that.   Will do ramp echo today to make sure LVAD speed is optimized.   Off amiodarone, no further arrhythmias noted.   Marca Ancona 06/17/2022 9:49 AM

## 2022-06-17 NOTE — Progress Notes (Signed)
  Echocardiogram 2D Echocardiogram has been performed.  Kevin Underwood 06/17/2022, 10:44 AM

## 2022-06-17 NOTE — Progress Notes (Signed)
Pharmacy Antibiotic Note  Kevin Underwood is a 61 y.o. male admitted on 06/12/2022 with LVAD Driveline infection s/p debridement today. Noted patient was on Daptomycin with CK increased up to 1000, pt transitioned back to vancomycin.   Cr has remained stable, will check levels after tonights dose.    Plan: Continue vancomycin 1000mg  IV q24h Check Vp, Vt tonight and tomorrow   Height: 5\' 10"  (177.8 cm) Weight: 82.2 kg (181 lb 3.5 oz) IBW/kg (Calculated) : 73  Temp (24hrs), Avg:98.3 F (36.8 C), Min:97.6 F (36.4 C), Max:98.6 F (37 C)  Recent Labs  Lab 06/13/22 0603 06/14/22 0318 06/15/22 0335 06/16/22 0349 06/17/22 0400  WBC 9.4 8.4 12.4* 10.1 9.0  CREATININE 1.79* 1.87* 1.86* 1.77* 1.65*     Estimated Creatinine Clearance: 48.5 mL/min (A) (by C-G formula based on SCr of 1.65 mg/dL (H)).    No Known Allergies    Thank you for allowing pharmacy to be a part of this patient's care.  08/17/22, PharmD, BCPS, [facility] Clinical Pharmacist [phone] Please check AMION for all [facility] numbers 06/17/2022

## 2022-06-17 NOTE — Progress Notes (Signed)
Mobility Specialist Progress Note    06/17/22 1120  Mobility  Activity Ambulated independently in hallway  Level of Assistance Modified independent, requires aide device or extra time  Assistive Device Other (Comment) (IV pole)  Distance Ambulated (ft) 500 ft  Activity Response Tolerated well  $Mobility charge 1 Mobility   Pt received in chair about to go outside. No complaints. Left with go bag and wife present outside of Main Entrance A, RN aware.   [place] Mobility Specialist

## 2022-06-17 NOTE — Progress Notes (Signed)
Inpatient Diabetes Program Recommendations  AACE/ADA: New Consensus Statement on Inpatient Glycemic Control (2015)  Target Ranges:  Prepandial:   less than 140 mg/dL      Peak postprandial:   less than 180 mg/dL (1-2 hours)      Critically ill patients:  140 - 180 mg/dL   Lab Results  Component Value Date   GLUCAP 206 (H) 06/17/2022   HGBA1C 8.2 (H) 05/13/2022    Review of Glycemic Control  Latest Reference Range & Units 06/15/22 16:35 06/15/22 21:25 06/16/22 06:14 06/16/22 11:37 06/16/22 15:28 06/16/22 21:31 06/17/22 06:23  Glucose-Capillary 70 - 99 mg/dL 469 (H) 629 (H) 528 (H) 279 (H) 203 (H) 171 (H) 206 (H)   Diabetes history: DM 2 Outpatient Diabetes medications:  Glucotrol 5 mg bid Current orders for Inpatient glycemic control:  Novolog moderate tid with meals and HS  Inpatient Diabetes Program Recommendations:    Please consider adding Levemir 15 units daily.  Blood sugars>goal.   Thanks,  Beryl Meager, RN, BC-ADM Inpatient Diabetes Coordinator Pager [phone]  (8a-5p)

## 2022-06-17 NOTE — Progress Notes (Signed)
Speed  Flow  PI  Power  LVIDD  AI  Aortic opening MR  TR  Septum  RV  VTI (>18cm)  5500 3.9 4.7 4.0 4.7 none 5/5 trace trace bow to right  Dilated with mild HK    5600  4.2 3.4 4.2 4.6 none 5/5 trace trace Bow to right    5700  4.3 3.9 4.3 4.9 none 3/5 trace trace midline                                               Auto cuff BP:  134/94 (105)   Ramp ECHO performed at bedside per Dr. Shirlee Latch  At completion of ramp study, patients primary controller programmed:  Fixed speed:  5600 Low speed limit: 5300   Hessie Diener RN, VAD Coordinator 24/7 pager [phone]

## 2022-06-18 DIAGNOSIS — T827XXA Infection and inflammatory reaction due to other cardiac and vascular devices, implants and grafts, initial encounter: Secondary | ICD-10-CM | POA: Diagnosis not present

## 2022-06-18 DIAGNOSIS — Z95811 Presence of heart assist device: Secondary | ICD-10-CM | POA: Diagnosis not present

## 2022-06-18 LAB — BASIC METABOLIC PANEL
Anion gap: 11 (ref 5–15)
BUN: 28 mg/dL — ABNORMAL HIGH (ref 8–23)
CO2: 26 mmol/L (ref 22–32)
Calcium: 8.9 mg/dL (ref 8.9–10.3)
Chloride: 101 mmol/L (ref 98–111)
Creatinine, Ser: 1.47 mg/dL — ABNORMAL HIGH (ref 0.61–1.24)
GFR, Estimated: 54 mL/min — ABNORMAL LOW (ref >60)
Glucose, Bld: 162 mg/dL — ABNORMAL HIGH (ref 70–99)
Potassium: 4.5 mmol/L (ref 3.5–5.1)
Sodium: 138 mmol/L (ref 135–145)

## 2022-06-18 LAB — CK: Total CK: 125 U/L (ref 49–397)

## 2022-06-18 LAB — CBC
HCT: 26 % — ABNORMAL LOW (ref 39.0–52.0)
Hemoglobin: 8.3 g/dL — ABNORMAL LOW (ref 13.0–17.0)
MCH: 26.7 pg (ref 26.0–34.0)
MCHC: 31.9 g/dL (ref 30.0–36.0)
MCV: 83.6 fL (ref 80.0–100.0)
Platelets: 299 10*3/uL (ref 150–400)
RBC: 3.11 MIL/uL — ABNORMAL LOW (ref 4.22–5.81)
RDW: 17.2 % — ABNORMAL HIGH (ref 11.5–15.5)
WBC: 9.9 10*3/uL (ref 4.0–10.5)
nRBC: 0 % (ref 0.0–0.2)

## 2022-06-18 LAB — GLUCOSE, CAPILLARY
Glucose-Capillary: 177 mg/dL — ABNORMAL HIGH (ref 70–99)
Glucose-Capillary: 329 mg/dL — ABNORMAL HIGH (ref 70–99)
Glucose-Capillary: 211 mg/dL — ABNORMAL HIGH (ref 70–99)
Glucose-Capillary: 160 mg/dL — ABNORMAL HIGH (ref 70–99)

## 2022-06-18 LAB — FUNGUS CULTURE WITH STAIN

## 2022-06-18 LAB — HEPARIN LEVEL (UNFRACTIONATED): Heparin Unfractionated: <0.1 IU/mL — ABNORMAL LOW (ref 0.30–0.70)

## 2022-06-18 LAB — PROTIME-INR
INR: 1.1 (ref 0.8–1.2)
Prothrombin Time: 14 seconds (ref 11.4–15.2)

## 2022-06-18 LAB — FUNGUS CULTURE RESULT

## 2022-06-18 LAB — FUNGAL ORGANISM REFLEX

## 2022-06-18 LAB — LACTATE DEHYDROGENASE: LDH: 199 U/L — ABNORMAL HIGH (ref 98–192)

## 2022-06-18 LAB — VANCOMYCIN, TROUGH: Vancomycin Tr: 13 ug/mL — ABNORMAL LOW (ref 15–20)

## 2022-06-18 MED ORDER — INSULIN DETEMIR 100 UNIT/ML ~~LOC~~ SOLN
15.0000 [IU] | Freq: Every day | SUBCUTANEOUS | Status: DC
  Administered 2022-06-18 – 2022-07-17 (×30): 15 [IU] via SUBCUTANEOUS
  Filled 2022-06-18 (×31): qty 0.15

## 2022-06-18 MED ORDER — ORAL CARE MOUTH RINSE: 15.0000 mL | OROMUCOSAL | Status: DC | PRN

## 2022-06-18 MED ORDER — WARFARIN SODIUM 7.5 MG PO TABS
7.5000 mg | ORAL_TABLET | Freq: Once | ORAL | Status: AC
  Administered 2022-06-18: 7.5 mg via ORAL
  Filled 2022-06-18: qty 1

## 2022-06-18 NOTE — Plan of Care (Signed)
  Problem: Education: Goal: Knowledge of General Education information will improve Description: Including pain rating scale, medication(s)/side effects and non-pharmacologic comfort measures Outcome: Progressing   Problem: Health Behavior/Discharge Planning: Goal: Ability to manage health-related needs will improve Outcome: Progressing   Problem: Clinical Measurements: Goal: Ability to maintain clinical measurements within normal limits will improve Outcome: Progressing Goal: Respiratory complications will improve Outcome: Progressing Goal: Cardiovascular complication will be avoided Outcome: Progressing   Problem: Activity: Goal: Risk for activity intolerance will decrease Outcome: Progressing   Problem: Nutrition: Goal: Adequate nutrition will be maintained Outcome: Progressing   Problem: Coping: Goal: Level of anxiety will decrease Outcome: Progressing   Problem: Elimination: Goal: Will not experience complications related to bowel motility Outcome: Progressing Goal: Will not experience complications related to urinary retention Outcome: Progressing   Problem: Pain Managment: Goal: General experience of comfort will improve Outcome: Progressing   

## 2022-06-18 NOTE — Progress Notes (Incomplete)
Patient ID: Kevin Underwood, male   DOB: 1961/09/30, 61 y.o.   MRN: [id number]   Advanced Heart Failure VAD Team Note  PCP-Cardiologist: Nona Dell, MD   Subjective:    S/p driveline site debridement and wound vac on 7/7.   On heparin drip. INR 1.1   Crestor and daptomycin stopped w/ elevated CK level, no muscle pain.  CK 1000 => 757 => 598.  Now on vancomycin.    SCr stable, 1.87 => 1.77=>1.65  Denies SOB. Denies N/V  LVAD INTERROGATION:  HeartMate III LVAD:   Flow 3.9 liters/min, speed 5500, power 4, PI 4.2   < 20 PI events   Objective:    Vital Signs:   Temp:  [97.9 F (36.6 C)-98.6 F (37 C)] 97.9 F (36.6 C) (07/11 0326) Pulse Rate:  [76-78] 76 (07/10 2248) Resp:  [16-20] 16 (07/11 0326) BP: (95-141)/(60-99) 104/89 (07/11 0326) SpO2:  [94 %-96 %] 94 % (07/10 2248) Weight:  [82.7 kg] 82.7 kg (07/11 0326) Last BM Date : 06/14/22 Mean arterial Pressure  90-100s  Intake/Output:   Intake/Output Summary (Last 24 hours) at 06/18/2022 0702 Last data filed at 06/18/2022 0327 Gross per 24 hour  Intake 675.93 ml  Output 1485 ml  Net -809.07 ml     Physical Exam    Physical Exam: GENERAL: No acute distress. Sitting on the side of the bed.  HEENT: normal  NECK: Supple, JVP 5-6  .  2+ bilaterally, no bruits.  No lymphadenopathy or thyromegaly appreciated.   CARDIAC:  Mechanical heart sounds with LVAD hum present.  LUNGS:  Clear to auscultation bilaterally.  ABDOMEN:  Soft, round, nontender, positive bowel sounds x4.     LVAD exit site: [facility] Dressing dry and intact.  No erythema or drainage.  Driveline dressing is being changed daily per sterile technique. EXTREMITIES:  Warm and dry, no cyanosis, clubbing, rash or edema . RUE single PICC  NEUROLOGIC:  Alert and oriented x 3.    No aphasia.  No dysarthria.  Affect pleasant.      Telemetry   SR 80s personally checked.   Labs   Basic Metabolic Panel: Recent Labs  Lab 06/14/22 0318 06/15/22 0335 06/16/22 0349  06/17/22 0400 06/18/22 0330  NA 137 135 135 136 138  K 3.8 4.0 4.5 4.3 4.5  CL 99 98 100 100 101  CO2 26 26 25 26 26   GLUCOSE 211* 201* 237* 215* 162*  BUN 27* 29* 29* 27* 28*  CREATININE 1.87* 1.86* 1.77* 1.65* 1.47*  CALCIUM 8.7* 8.7* 8.8* 8.8* 8.9    Liver Function Tests: Recent Labs  Lab 06/12/22 1230  AST 68*  ALT 86*  ALKPHOS 114  BILITOT 0.2*  PROT 6.9  ALBUMIN 2.7*   No results for input(s): "LIPASE", "AMYLASE" in the last 168 hours. No results for input(s): "AMMONIA" in the last 168 hours.  CBC: Recent Labs  Lab 06/12/22 1230 06/13/22 0603 06/14/22 0318 06/15/22 0335 06/16/22 0349 06/17/22 0400 06/18/22 0330  WBC 10.5   < > 8.4 12.4* 10.1 9.0 9.9  NEUTROABS 8.0*  --   --   --   --   --   --   HGB 9.5*   < > 9.1* 7.5* 8.0* 8.2* 8.3*  HCT 28.6*   < > 27.6* 24.1* 25.7* 26.0* 26.0*  MCV 82.2   < > 82.6 84.3 84.3 84.4 83.6  PLT 385   < > 310 278 294 291 299   < > = values in  this interval not displayed.    INR: Recent Labs  Lab 06/14/22 0318 06/15/22 0335 06/16/22 0349 06/17/22 0400 06/18/22 0330  INR 1.3* 1.2 1.1 1.1 1.1    Other results: EKG:    Imaging   ECHOCARDIOGRAM LIMITED  Result Date: 06/17/2022    ECHOCARDIOGRAM LIMITED REPORT   Patient Name:   Kevin Underwood Date of Exam: 06/17/2022 Medical Rec #:  [id number]        Height:       70.0 in Accession #:    [id number]       Weight:       181.2 lb Date of Birth:  March 13, 1961        BSA:          2.002 m Patient Age:    2 years         BP:           0/0 mmHg Patient Gender: M                HR:           69 bpm. Exam Location:  Inpatient Procedure: Limited Echo, Limited Color Doppler and Cardiac Doppler Indications:    CHF-Acute Systolic [id number]  History:        Patient has prior history of Echocardiogram examinations, most                 recent 01/10/2022. CHF, CAD, Arrythmias:Atrial Fibrillation; Risk                 Factors:Diabetes and Hypertension.  Sonographer:    Bernadene Person RDCS  Referring Phys: [place]  1. LVAD present at LV apex. Speed increased from 5500 to 5700. Septum mildly shifted to the right at 5500, shifted more leftward at 5700. Speed left at 5600 rpm at end of study. Left ventricular ejection fraction, by estimation, is 20 to 25%. The left ventricle has severely decreased function. The left ventricle demonstrates global hypokinesis. There is mild left ventricular hypertrophy. Left ventricular diastolic parameters are indeterminate.  2. Right ventricular systolic function is mildly reduced. The right ventricular size is moderately enlarged. Tricuspid regurgitation signal is inadequate for assessing PA pressure.  3. Left atrial size was mildly dilated.  4. Right atrial size was mildly dilated.  5. The mitral valve is normal in structure. Mild mitral valve regurgitation. No evidence of mitral stenosis.  6. The aortic valve opened every beat at 5500 rpm, every other beat at 5700 rpm. The aortic valve is tricuspid. Aortic valve regurgitation is not visualized. No aortic stenosis is present.  7. The inferior vena cava is normal in size with greater than 50% respiratory variability, suggesting right atrial pressure of 3 mmHg.  8. LVAD ramp study. FINDINGS  Left Ventricle: LVAD present at LV apex. Speed increased from 5500 to 5700. Septum mildly shifted to the right at 5500, shifted more leftward at 5700. Speed left at 5600 rpm at end of study. Left ventricular ejection fraction, by estimation, is 20 to 25%. The left ventricle has severely decreased function. The left ventricle demonstrates global hypokinesis. The left ventricular internal cavity size was normal in size. There is mild left ventricular hypertrophy. Left ventricular diastolic parameters are indeterminate. Right Ventricle: The right ventricular size is moderately enlarged. Right ventricular systolic function is mildly reduced. Tricuspid regurgitation signal is inadequate for assessing PA pressure.  Left Atrium: Left atrial size was mildly dilated. Right Atrium: Right atrial size was mildly dilated.  Mitral Valve: The mitral valve is normal in structure. Mild mitral valve regurgitation. No evidence of mitral valve stenosis. Tricuspid Valve: The tricuspid valve is normal in structure. Tricuspid valve regurgitation is not demonstrated. Aortic Valve: The aortic valve opened every beat at 5500 rpm, every other beat at 5700 rpm. The aortic valve is tricuspid. Aortic valve regurgitation is not visualized. No aortic stenosis is present. Pulmonic Valve: The pulmonic valve was normal in structure. Pulmonic valve regurgitation is not visualized. Venous: The inferior vena cava is normal in size with greater than 50% respiratory variability, suggesting right atrial pressure of 3 mmHg. IAS/Shunts: No atrial level shunt detected by color flow Doppler. RIGHT VENTRICLE RV S prime:     7.54 cm/s PULMONIC VALVE RVOT Peak grad: 2 mmHg   SHUNTS Pulmonic VTI: 0.131 m Dalton McleanMD Electronically signed by Franki Monte Signature Date/Time: 06/17/2022/10:52:18 AM    Final      Medications:     Scheduled Medications:  allopurinol  100 mg Oral Daily   Chlorhexidine Gluconate Cloth  6 each Topical Daily   docusate sodium  200 mg Oral BID   gabapentin  600 mg Oral BID   guaiFENesin  600 mg Oral BID   hydrALAZINE  25 mg Oral TID   insulin aspart  0-15 Units Subcutaneous TID WC   insulin aspart  0-5 Units Subcutaneous QHS   insulin detemir  12 Units Subcutaneous QHS   lactose free nutrition  237 mL Oral TID WC   loratadine  10 mg Oral Daily   methimazole  20 mg Oral BID   metoprolol succinate  25 mg Oral Daily   pantoprazole  40 mg Oral Daily   sertraline  50 mg Oral Daily   tamsulosin  0.4 mg Oral Daily   traZODone  150 mg Oral QHS   Warfarin - Pharmacist Dosing Inpatient   Does not apply q1600    Infusions:  heparin 500 Units/hr (06/18/22 0326)   vancomycin Stopped (06/17/22 2145)    PRN  Medications: acetaminophen, albuterol, morphine injection, ondansetron (ZOFRAN) IV, traMADol   Patient Profile   61 y.o. male with history of HM III LVAD, chronic systolic CHF/ICM, COPD, PAF. 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   Readmitted for driveline infection and surgical debridement/possible wound vac placement.  Assessment/Plan:    1. Driveline infection: Recent driveline infection [id number]. S/p driveline site debridement 6/8. Surgical wound cultures grew MRSA, repeat debridement 6/23. Driveline debridement with wound vac placement on 7/7.  - Transitioned from daptomycin to vancomycin due to elevated CK.  CK trending down, so far no problems with vancomycin. Vancomycin planned x 6 wks.  - Back to OR for wound vac change on Thursday.  - On heparin drip/ INR 1.1 3.  Chronic systolic CHF: Echo [id number] with EF 20-25%, mildly decreased RV function. LHC/RHC in 12/21 with patent grafts, low output. Suspect mixed ischemic/nonischemic cardiomyopathy (prior heavy ETOH and drugs as well as CAD).  No ETOH, drugs, smoking since CABG in 12/20. Admitted with cardiogenic shock in 12/21, had placement of Impella 5.5 initially, now s/p Heartmate 3 LVAD on 11/17/20.  Ramp echo 2/23 with speed decreased to 5500 rpm. He would eventually like heart transplant. NYHA class II. Have been allowing MAP to run higher due to recent AKI thought to be due to lowering BP too much in setting of right renal artery stenosis.  MAP 80s-90s. INR 1.1 today on warfarin/low dose heparin. LDH stable 229 -  Continue hydralazine 25 mg TID - Continue Toprol XL 25 mg daily.  - No diuretic. Does not appear volume overloaded.  - Continue warfarin/low dose heparin.  - ramp echo  today.   4. AKI: Creatinine up to 4.6 last admission, initially thought this was ATN from low BP but he was never markedly hypotensive and LFTs without any evidence of shock liver. ? Vanc toxicity (troughs were ok).  Taken off Entresto and hydralazine. Volume repleted with IVF. In reviewing prior CTA abdomen/pelvis, he has high-grade stenosis of proximal right renal artery. D/w Renal and feel like he may need higher perfusion pressures. Creatinine stable at 1.65 today.  - Would consider reassessment of renal artery stenosis and possible treatment once creatinine has improved to baseline.  - Watch MAPs, avoid hypotension 5. CAD: S/p CABG 12/20.  LHC pre-VAD with patent grafts, no target for intervention. - Stopped statin with elevated CK => this was in setting of daptomycin. Should be able to eventually restart statin but would allow CK to normalize first. 6. Atrial fibrillation: Paroxysmal.  S/p Maze and LA appendage clip with CABG in 12/20. Maintaining NSR.  - Continue Toprol XL 25 mg daily.  - Off amiodarone with hyperthyroidism. - Warfarin + heparin per above  7. Type 2 diabetes: Takes glipizide at home.  - SSI  8. 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  9. 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  He missed his endocrinology appt. Has been rescheduled for July 27th. - He is now off amiodarone.   - Continue methimazole 20 mg bid, TSH was mildly elevated.   10. Gout: On allopurinol.  11. Anemia: Hgb 8.2 today.  - Transfuse hgb < 7.    Ramp ECHO later today.  TOC CM for HH.   I reviewed the LVAD parameters from today, and compared the results to the patient's prior recorded data.  No programming changes were made.  The LVAD is functioning within specified parameters.  The patient performs LVAD self-test daily.  LVAD interrogation was negative for any significant power changes, alarms or PI events/speed drops.  LVAD equipment check completed and is in good working order.  Back-up equipment present.   LVAD education done on emergency procedures and precautions and reviewed exit site care.  Length of Stay: 6  Aalaiyah Yassin, NP 06/18/2022, 7:02 AM  VAD Team ---  VAD ISSUES ONLY--- Pager [phone] (7am - 7am)  Advanced Heart Failure Team  Pager [phone] (M-F; 7a - 5p)  Please contact [facility] for night-coverage after hours (5p -7a ) and weekends on [email or web address]   Patient seen with NP, agree with the above note.   No complaints this morning.  LVAD parameters stable.  MAP mildly elevated 90s generally. Creatinine lower at 1.65.   General: Well appearing this am. NAD.  HEENT: Normal. Neck: Supple, JVP 7-8 cm. Carotids OK.  Cardiac:  Mechanical heart sounds with LVAD hum present.  Lungs:  CTAB, normal effort.  Abdomen:  NT, ND, no HSM. No bruits or masses. +BS  LVAD exit site: Well-healed and incorporated. Dressing dry and intact. No erythema or drainage. Stabilization device present and accurately applied. Driveline dressing changed daily per sterile technique. Extremities:  Warm and dry. No cyanosis, clubbing, rash, or edema.  Neuro:  Alert & oriented x 3. Cranial nerves grossly intact. Moves all 4 extremities w/o difficulty. Affect pleasant    Continue warfarin/heparin overlap with INR 1.1 today.    Plan to return to OR for  wound vac change on Thursday, likely home soon after that.   Will do ramp echo today to make sure LVAD speed is optimized.   Off amiodarone, no further arrhythmias noted.   Bryann Mcnealy 06/18/2022 7:02 AM

## 2022-06-18 NOTE — Progress Notes (Signed)
ANTICOAGULATION CONSULT NOTE  Pharmacy Consult for heparin + warfarin Indication:  LVAD  No Known Allergies  Patient Measurements: Height: 5\' 10"  (177.8 cm) Weight: 82.7 kg (182 lb 5.1 oz) IBW/kg (Calculated) : 73 Heparin Dosing Weight: 80kg  Vital Signs: Temp: 98.1 F (36.7 C) (07/11 0739) Temp Source: Oral (07/11 0739) BP: 101/90 (07/11 0739) Pulse Rate: 98 (07/11 0739)  Labs: Recent Labs    06/16/22 0349 06/17/22 0400 06/18/22 0330  HGB 8.0* 8.2* 8.3*  HCT 25.7* 26.0* 26.0*  PLT 294 291 299  LABPROT 13.9 13.9 14.0  INR 1.1 1.1 1.1  HEPARINUNFRC <0.10* <0.10* <0.10*  CREATININE 1.77* 1.65* 1.47*  CKTOTAL 598*  --  125     Estimated Creatinine Clearance: 54.5 mL/min (A) (by C-G formula based on SCr of 1.47 mg/dL (H)).   Medical History: Past Medical History:  Diagnosis Date   "    Arthritis    CAD (coronary artery disease)    a. s/p CABG in 11/2019 with LIMA-LAD, SVG-OM1, SVG-PDA and SVG-D1   CHF (congestive heart failure) (HCC)    a. EF < 20% by echo in 11/2019   Essential hypertension    PAF (paroxysmal atrial fibrillation) (HCC)    Type 2 diabetes mellitus (HCC)     Assessment: 60 yoM with HM3 LVAD admitted with ongoing LVAD driveline infection. Pt with planned I&D in OR 7/7. Pt is on warfarin PTA with last dose taken 7/3.   INR remains subtherapeutic at 1.1, CBC and LDH stable. Heparin level <0.1 as expected. Discussed with Dr. 9/7 - ok to continue warfarin with upcoming wound vac change, keep INR 2.2 or less. Will give boosted dose tonight to facilitate getting pt off heparin drip.  *Home warfarin dose 6mg  MWF, 4mg  AODs  Goal of Therapy:  INR goal 2-2.5 Heparin level ~0.3 units/ml Monitor platelets by anticoagulation protocol: Yes   Plan:  -Warfarin 7.5mg  PO x1 tonight -Continue heparin at 500 units/hr -Daily INR, heparin level, CBC, LDH   Donata Clay, PharmD, [place], [facility] Clinical Pharmacist [phone] Please check AMION for all  [facility] numbers 06/18/2022

## 2022-06-18 NOTE — Progress Notes (Signed)
LVAD Coordinator Rounding Note:  Admitted 06/12/22 to Dr. Alford Highland service due to drive line debridement scheduled with Dr Donata Clay 06/14/22.   Pt sitting up in chair at bedside. Denies complaints this morning.   Vital signs: Temp:  98.1 HR: 98 Doppler Pressure: 82 Automatic BP: 101/90 (95) O2 Sat: 94% on RA Wt: 171.7>174.6>178.6>182.9>181>182.3 lbs   LVAD interrogation reveals:  Speed: 5600 Flow:  4.4 Power:  4.1w PI:  3.8 Alarms: none Events:  >90 PI events on 06/17/22  Fixed speed:  5500 Low speed limit:  5200  Drive Line:  Wound vac in place with 125 suction; no leaks noted; draining serosanguinous fluid. Anchor intact to drive line. Planned wound vac change Thursday, 06/20/22 per Dr. Donata Clay.   Labs:  LDH trend: 249>244>208>22>229>199  INR trend: 1.3 >1.3>1.2>1.1>1.1>1.1  Anticoagulation Plan: -INR Goal: 2.0 - 2.5 -ASA Dose: none  ICD: N/A  Drips:  Heparin - 500 units/hr  Infection:  06/07/22>> repeat drive line culture>> abundant MRSA; final  Plan/Recommendations:  Page VAD coordinators for equipment or drive line issues Plan for drive line debridement in OR per Dr Donata Clay Thursday, 06/20/22. VAD coordinator will accompany pt.   Hessie Diener RN VAD Coordinator  Office: [phone]  24/7 Pager: [phone]

## 2022-06-18 NOTE — Progress Notes (Addendum)
Patient ID: Kevin Underwood, male   DOB: 1961-06-20, 61 y.o.   MRN: [id number]   Advanced Heart Failure VAD Team Note  PCP-Cardiologist: Nona Dell, MD   Subjective:    S/p driveline site debridement and wound vac on 7/7.  7/10 Had Ramp ECHO. Speed optimized.   On heparin drip. INR 1.1   Crestor and daptomycin stopped w/ elevated CK level, no muscle pain.  CK 1000 => 757 => 598=>125.   Now on vancomycin.    SCr stable, 1.87 => 1.77=>1.65=>1.47  No complaints. Walked around the unit.   LVAD INTERROGATION:  HeartMate III LVAD:   Flow 4.1 liters/min, speed 5600, power 4, PI 3.3    21 PI events.  Objective:    Vital Signs:   Temp:  [97.9 F (36.6 C)-98.6 F (37 C)] 97.9 F (36.6 C) (07/11 0326) Pulse Rate:  [76-78] 76 (07/10 2248) Resp:  [16-20] 16 (07/11 0326) BP: (95-141)/(60-99) 104/89 (07/11 0326) SpO2:  [94 %-96 %] 94 % (07/10 2248) Weight:  [82.7 kg] 82.7 kg (07/11 0326) Last BM Date : 06/14/22 Mean arterial Pressure  90-100s  Intake/Output:   Intake/Output Summary (Last 24 hours) at 06/18/2022 0703 Last data filed at 06/18/2022 0327 Gross per 24 hour  Intake 675.93 ml  Output 1485 ml  Net -809.07 ml     Physical Exam   Physical Exam: GENERAL: No acute distress. HEENT: normal  NECK: Supple, JVP  5-6 .  2+ bilaterally, no bruits.  No lymphadenopathy or thyromegaly appreciated.   CARDIAC:  Mechanical heart sounds with LVAD hum present.  LUNGS:  Clear to auscultation bilaterally.  ABDOMEN:  Soft, round, nontender, positive bowel sounds x4.     LVAD exit site: VAC dressing in plance.  EXTREMITIES:  Warm and dry, no cyanosis, clubbing, rash or edema . RUE PICC  NEUROLOGIC:  Alert and oriented x 3.    No aphasia.  No dysarthria.  Affect pleasant.       Telemetry   SR 70s personally checked.   Labs   Basic Metabolic Panel: Recent Labs  Lab 06/14/22 0318 06/15/22 0335 06/16/22 0349 06/17/22 0400 06/18/22 0330  NA 137 135 135 136 138  K 3.8 4.0  4.5 4.3 4.5  CL 99 98 100 100 101  CO2 26 26 25 26 26   GLUCOSE 211* 201* 237* 215* 162*  BUN 27* 29* 29* 27* 28*  CREATININE 1.87* 1.86* 1.77* 1.65* 1.47*  CALCIUM 8.7* 8.7* 8.8* 8.8* 8.9    Liver Function Tests: Recent Labs  Lab 06/12/22 1230  AST 68*  ALT 86*  ALKPHOS 114  BILITOT 0.2*  PROT 6.9  ALBUMIN 2.7*   No results for input(s): "LIPASE", "AMYLASE" in the last 168 hours. No results for input(s): "AMMONIA" in the last 168 hours.  CBC: Recent Labs  Lab 06/12/22 1230 06/13/22 0603 06/14/22 0318 06/15/22 0335 06/16/22 0349 06/17/22 0400 06/18/22 0330  WBC 10.5   < > 8.4 12.4* 10.1 9.0 9.9  NEUTROABS 8.0*  --   --   --   --   --   --   HGB 9.5*   < > 9.1* 7.5* 8.0* 8.2* 8.3*  HCT 28.6*   < > 27.6* 24.1* 25.7* 26.0* 26.0*  MCV 82.2   < > 82.6 84.3 84.3 84.4 83.6  PLT 385   < > 310 278 294 291 299   < > = values in this interval not displayed.    INR: Recent Labs  Lab 06/14/22 [id number]  06/15/22 0335 06/16/22 0349 06/17/22 0400 06/18/22 0330  INR 1.3* 1.2 1.1 1.1 1.1    Other results: EKG:    Imaging   ECHOCARDIOGRAM LIMITED  Result Date: 06/17/2022    ECHOCARDIOGRAM LIMITED REPORT   Patient Name:   Kevin Underwood Date of Exam: 06/17/2022 Medical Rec #:  [id number]        Height:       70.0 in Accession #:    [id number]       Weight:       181.2 lb Date of Birth:  [birth year]        BSA:          2.002 m Patient Age:    22 years         BP:           0/0 mmHg Patient Gender: M                HR:           69 bpm. Exam Location:  Inpatient Procedure: Limited Echo, Limited Color Doppler and Cardiac Doppler Indications:    CHF-Acute Systolic [id number]  History:        Patient has prior history of Echocardiogram examinations, most                 recent 01/10/2022. CHF, CAD, Arrythmias:Atrial Fibrillation; Risk                 Factors:Diabetes and Hypertension.  Sonographer:    Bernadene Person RDCS Referring Phys: [place]  1. LVAD present at LV  apex. Speed increased from 5500 to 5700. Septum mildly shifted to the right at 5500, shifted more leftward at 5700. Speed left at 5600 rpm at end of study. Left ventricular ejection fraction, by estimation, is 20 to 25%. The left ventricle has severely decreased function. The left ventricle demonstrates global hypokinesis. There is mild left ventricular hypertrophy. Left ventricular diastolic parameters are indeterminate.  2. Right ventricular systolic function is mildly reduced. The right ventricular size is moderately enlarged. Tricuspid regurgitation signal is inadequate for assessing PA pressure.  3. Left atrial size was mildly dilated.  4. Right atrial size was mildly dilated.  5. The mitral valve is normal in structure. Mild mitral valve regurgitation. No evidence of mitral stenosis.  6. The aortic valve opened every beat at 5500 rpm, every other beat at 5700 rpm. The aortic valve is tricuspid. Aortic valve regurgitation is not visualized. No aortic stenosis is present.  7. The inferior vena cava is normal in size with greater than 50% respiratory variability, suggesting right atrial pressure of 3 mmHg.  8. LVAD ramp study. FINDINGS  Left Ventricle: LVAD present at LV apex. Speed increased from 5500 to 5700. Septum mildly shifted to the right at 5500, shifted more leftward at 5700. Speed left at 5600 rpm at end of study. Left ventricular ejection fraction, by estimation, is 20 to 25%. The left ventricle has severely decreased function. The left ventricle demonstrates global hypokinesis. The left ventricular internal cavity size was normal in size. There is mild left ventricular hypertrophy. Left ventricular diastolic parameters are indeterminate. Right Ventricle: The right ventricular size is moderately enlarged. Right ventricular systolic function is mildly reduced. Tricuspid regurgitation signal is inadequate for assessing PA pressure. Left Atrium: Left atrial size was mildly dilated. Right Atrium: Right  atrial size was mildly dilated. Mitral Valve: The mitral valve is normal in structure. Mild mitral valve regurgitation. No  evidence of mitral valve stenosis. Tricuspid Valve: The tricuspid valve is normal in structure. Tricuspid valve regurgitation is not demonstrated. Aortic Valve: The aortic valve opened every beat at 5500 rpm, every other beat at 5700 rpm. The aortic valve is tricuspid. Aortic valve regurgitation is not visualized. No aortic stenosis is present. Pulmonic Valve: The pulmonic valve was normal in structure. Pulmonic valve regurgitation is not visualized. Venous: The inferior vena cava is normal in size with greater than 50% respiratory variability, suggesting right atrial pressure of 3 mmHg. IAS/Shunts: No atrial level shunt detected by color flow Doppler. RIGHT VENTRICLE RV S prime:     7.54 cm/s PULMONIC VALVE RVOT Peak grad: 2 mmHg   SHUNTS Pulmonic VTI: 0.131 m Josean Lycan McleanMD Electronically signed by Franki Monte Signature Date/Time: 06/17/2022/10:52:18 AM    Final      Medications:     Scheduled Medications:  allopurinol  100 mg Oral Daily   Chlorhexidine Gluconate Cloth  6 each Topical Daily   docusate sodium  200 mg Oral BID   gabapentin  600 mg Oral BID   guaiFENesin  600 mg Oral BID   hydrALAZINE  25 mg Oral TID   insulin aspart  0-15 Units Subcutaneous TID WC   insulin aspart  0-5 Units Subcutaneous QHS   insulin detemir  12 Units Subcutaneous QHS   lactose free nutrition  237 mL Oral TID WC   loratadine  10 mg Oral Daily   methimazole  20 mg Oral BID   metoprolol succinate  25 mg Oral Daily   pantoprazole  40 mg Oral Daily   sertraline  50 mg Oral Daily   tamsulosin  0.4 mg Oral Daily   traZODone  150 mg Oral QHS   Warfarin - Pharmacist Dosing Inpatient   Does not apply q1600    Infusions:  heparin 500 Units/hr (06/18/22 0326)   vancomycin Stopped (06/17/22 2145)    PRN Medications: acetaminophen, albuterol, morphine injection, ondansetron (ZOFRAN) IV,  traMADol   Patient Profile   61 y.o. male with history of HM III LVAD, chronic systolic CHF/ICM, COPD, PAF. 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   Readmitted for driveline infection and surgical debridement/possible wound vac placement.  Assessment/Plan:    1. Driveline infection: Recent driveline infection [id number]. S/p driveline site debridement 6/8. Surgical wound cultures grew MRSA, repeat debridement 6/23. Driveline debridement with wound vac placement on 7/7.  - Transitioned from daptomycin to vancomycin due to elevated CK.  CK trending down, so far no problems with vancomycin. Vancomycin planned x 6 wks.  - Back to OR for wound vac change on Thursday.  - On heparin drip/ INR 1.1 2.  Chronic systolic CHF: Echo [id number] with EF 20-25%, mildly decreased RV function. LHC/RHC in 12/21 with patent grafts, low output. Suspect mixed ischemic/nonischemic cardiomyopathy (prior heavy ETOH and drugs as well as CAD).  No ETOH, drugs, smoking since CABG in 12/20. Admitted with cardiogenic shock in 12/21, had placement of Impella 5.5 initially, now s/p Heartmate 3 LVAD on 11/17/20.  Ramp echo 2/23 with speed decreased to 5500 rpm. He would eventually like heart transplant. NYHA class II. Have been allowing MAP to run higher due to recent AKI thought to be due to lowering BP too much in setting of right renal artery stenosis.  - Had Ramp ECHO with speed optimized.  MAP 90-100s. INR 1.1 today on warfarin/low dose heparin. LDH stable 199 - Continue hydralazine 25 mg TID -  Continue Toprol XL 25 mg daily.  - Volume status stable.   - Continue warfarin/low dose heparin.  3. AKI: Creatinine up to 4.6 last admission, initially thought this was ATN from low BP but he was never markedly hypotensive and LFTs without any evidence of shock liver. ? Vanc toxicity (troughs were ok). Taken off Entresto and hydralazine. Volume repleted with IVF. In reviewing prior CTA  abdomen/pelvis, he has high-grade stenosis of proximal right renal artery. D/w Renal and feel like he may need higher perfusion pressures. Creatinine down to 1.47 - Would consider reassessment of renal artery stenosis and possible treatment once creatinine has improved to baseline.  - Watch MAPs, avoid hypotension 4.  CAD: S/p CABG 12/20.  LHC pre-VAD with patent grafts, no target for intervention. - Stopped statin with elevated CK => this was in setting of daptomycin. Should be able to eventually restart statin but would allow CK to normalize first. 5.Atrial fibrillation: Paroxysmal.  S/p Maze and LA appendage clip with CABG in 12/20. Maintaining NSR.  - Continue Toprol XL 25 mg daily.  - Off amiodarone with hyperthyroidism. - Warfarin + heparin per above  6.  Type 2 diabetes: Takes glipizide at home.  - SSI  - Levemir 12 units daily added. Continues to run high. Increase to 15 units.  7. 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  8. 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  He missed his endocrinology appt. Has been rescheduled for July 27th. - He is now off amiodarone.   - Continue methimazole 20 mg bid, TSH was mildly elevated.   9.  Gout: On allopurinol.  10. Anemia: Hgb 8.3 today.  - Transfuse hgb < 7.   Continuee dressing changes per Dr Maren Beach. VAC change on Thursday.   TOC CM for HH.   I reviewed the LVAD parameters from today, and compared the results to the patient's prior recorded data.  No programming changes were made.  The LVAD is functioning within specified parameters.  The patient performs LVAD self-test daily.  LVAD interrogation was negative for any significant power changes, alarms or PI events/speed drops.  LVAD equipment check completed and is in good working order.  Back-up equipment present.   LVAD education done on emergency procedures and precautions and reviewed exit site care.  Length of Stay: 6  Amy Clegg,  NP 06/18/2022, 7:03 AM  VAD Team --- VAD ISSUES ONLY--- Pager [phone] (7am - 7am)  Advanced Heart Failure Team  Pager [phone] (M-F; 7a - 5p)  Please contact [facility] for night-coverage after hours (5p -7a ) and weekends on [email or web address]  Patient seen with NP, agree with the above note.   Stable today, no complaints.  Driveline site seems to be healing.  Remains on IV vancomycin.  MAP 80s-90s with stable creatinine.   General: Well appearing this am. NAD.  HEENT: Normal. Neck: Supple, JVP 7-8 cm. Carotids OK.  Cardiac:  Mechanical heart sounds with LVAD hum present.  Lungs:  CTAB, normal effort.  Abdomen:  NT, ND, no HSM. No bruits or masses. +BS  LVAD exit site: Wound vac present Extremities:  Warm and dry. No cyanosis, clubbing, rash, or edema.  Neuro:  Alert & oriented x 3. Cranial nerves grossly intact. Moves all 4 extremities w/o difficulty. Affect pleasant    Continue IV vancomcyin for MRSA driveline infection.  To go to OR Thursday with Dr. Donata Clay.    No changes to medication regimen.   Collene Massimino [employer]  06/18/2022 8:48 AM

## 2022-06-18 NOTE — Plan of Care (Signed)
  Problem: Education: Goal: Knowledge of General Education information will improve Description: Including pain rating scale, medication(s)/side effects and non-pharmacologic comfort measures Outcome: Progressing   Problem: Health Behavior/Discharge Planning: Goal: Ability to manage health-related needs will improve Outcome: Progressing   Problem: Clinical Measurements: Goal: Ability to maintain clinical measurements within normal limits will improve Outcome: Progressing Goal: Will remain free from infection Outcome: Progressing Goal: Diagnostic test results will improve Outcome: Progressing Goal: Respiratory complications will improve Outcome: Progressing Goal: Cardiovascular complication will be avoided Outcome: Progressing   Problem: Activity: Goal: Risk for activity intolerance will decrease Outcome: Progressing   Problem: Nutrition: Goal: Adequate nutrition will be maintained Outcome: Progressing   Problem: Coping: Goal: Level of anxiety will decrease Outcome: Progressing   Problem: Elimination: Goal: Will not experience complications related to bowel motility Outcome: Progressing Goal: Will not experience complications related to urinary retention Outcome: Progressing   Problem: Pain Managment: Goal: General experience of comfort will improve Outcome: Progressing   Problem: Safety: Goal: Ability to remain free from injury will improve Outcome: Progressing   Problem: Skin Integrity: Goal: Risk for impaired skin integrity will decrease Outcome: Progressing   Problem: Education: Goal: Ability to describe self-care measures that may prevent or decrease complications (Diabetes Survival Skills Education) will improve Outcome: Progressing Goal: Individualized Educational Video(s) Outcome: Progressing   Problem: Coping: Goal: Ability to adjust to condition or change in health will improve Outcome: Progressing   Problem: Fluid Volume: Goal: Ability to  maintain a balanced intake and output will improve Outcome: Progressing   Problem: Health Behavior/Discharge Planning: Goal: Ability to identify and utilize available resources and services will improve Outcome: Progressing Goal: Ability to manage health-related needs will improve Outcome: Progressing   

## 2022-06-18 NOTE — Progress Notes (Signed)
Inpatient Diabetes Program Recommendations  AACE/ADA: New Consensus Statement on Inpatient Glycemic Control (2015)  Target Ranges:  Prepandial:   less than 140 mg/dL      Peak postprandial:   less than 180 mg/dL (1-2 hours)      Critically ill patients:  140 - 180 mg/dL   Lab Results  Component Value Date   GLUCAP 329 (H) 06/18/2022   HGBA1C 8.2 (H) 05/13/2022    Review of Glycemic Control  Latest Reference Range & Units 06/17/22 06:23 06/17/22 11:07 06/17/22 15:08 06/17/22 21:19 06/18/22 06:26 06/18/22 11:38  Glucose-Capillary 70 - 99 mg/dL 063 (H) 016 (H) 010 (H) 270 (H) 177 (H) 329 (H)  Diabetes history: DM 2 Outpatient Diabetes medications:  Glucotrol 5 mg bid Current orders for Inpatient glycemic control:  Novolog moderate tid with meals and HS  Levemir 15 units q HS  Inpatient Diabetes Program Recommendations:    Levemir increased today.  If blood sugars continue to be increased at lunch and supper, consider adding Novolog meal coverage 4 units tid with meals (hold if patient eats less than 50%  or NPO).   Thanks,  Beryl Meager, RN, BC-ADM Inpatient Diabetes Coordinator Pager [phone]  (8a-5p)

## 2022-06-18 NOTE — Progress Notes (Signed)
Pharmacy Antibiotic Note  Kevin Underwood is a 61 y.o. male admitted on 06/12/2022 with LVAD Driveline infection s/p debridement today. Noted patient was on Daptomycin with CK increased up to 1000, pt transitioned back to vancomycin.   Cr remains stable, CK normalized, vancomycin levels demonstrate therapeutic AUC of 529.   Plan: Continue vancomycin 1000mg  IV q24h Check Vp, Vt tonight and tomorrow   Height: 5\' 10"  (177.8 cm) Weight: 82.7 kg (182 lb 5.1 oz) IBW/kg (Calculated) : 73  Temp (24hrs), Avg:98.3 F (36.8 C), Min:97.9 F (36.6 C), Max:99.2 F (37.3 C)  Recent Labs  Lab 06/14/22 0318 06/15/22 0335 06/16/22 0349 06/17/22 0400 06/17/22 2250 06/18/22 0330 06/18/22 1855  WBC 8.4 12.4* 10.1 9.0  --  9.9  --   CREATININE 1.87* 1.86* 1.77* 1.65*  --  1.47*  --   VANCOTROUGH  --   --   --   --   --   --  13*  VANCOPEAK  --   --   --   --  34  --   --      Estimated Creatinine Clearance: 54.5 mL/min (A) (by C-G formula based on SCr of 1.47 mg/dL (H)).    No Known Allergies    Thank you for allowing pharmacy to be a part of this patient's care.  08/19/22, PharmD, BCPS, [facility] Clinical Pharmacist [phone] Please check AMION for all [facility] Pharmacy numbers 06/18/2022

## 2022-06-19 DIAGNOSIS — Z95811 Presence of heart assist device: Secondary | ICD-10-CM | POA: Diagnosis not present

## 2022-06-19 DIAGNOSIS — T827XXA Infection and inflammatory reaction due to other cardiac and vascular devices, implants and grafts, initial encounter: Secondary | ICD-10-CM | POA: Diagnosis not present

## 2022-06-19 LAB — CBC
HCT: 26.2 % — ABNORMAL LOW (ref 39.0–52.0)
Hemoglobin: 8.2 g/dL — ABNORMAL LOW (ref 13.0–17.0)
MCH: 26.4 pg (ref 26.0–34.0)
MCHC: 31.3 g/dL (ref 30.0–36.0)
MCV: 84.2 fL (ref 80.0–100.0)
Platelets: 297 10*3/uL (ref 150–400)
RBC: 3.11 MIL/uL — ABNORMAL LOW (ref 4.22–5.81)
RDW: 17.6 % — ABNORMAL HIGH (ref 11.5–15.5)
WBC: 10.2 10*3/uL (ref 4.0–10.5)
nRBC: 0 % (ref 0.0–0.2)

## 2022-06-19 LAB — GLUCOSE, CAPILLARY
Glucose-Capillary: 101 mg/dL — ABNORMAL HIGH (ref 70–99)
Glucose-Capillary: 111 mg/dL — ABNORMAL HIGH (ref 70–99)
Glucose-Capillary: 204 mg/dL — ABNORMAL HIGH (ref 70–99)
Glucose-Capillary: 283 mg/dL — ABNORMAL HIGH (ref 70–99)

## 2022-06-19 LAB — PROTIME-INR
INR: 1.1 (ref 0.8–1.2)
Prothrombin Time: 13.9 seconds (ref 11.4–15.2)

## 2022-06-19 LAB — BASIC METABOLIC PANEL
Anion gap: 10 (ref 5–15)
BUN: 32 mg/dL — ABNORMAL HIGH (ref 8–23)
CO2: 26 mmol/L (ref 22–32)
Calcium: 8.7 mg/dL — ABNORMAL LOW (ref 8.9–10.3)
Chloride: 101 mmol/L (ref 98–111)
Creatinine, Ser: 1.58 mg/dL — ABNORMAL HIGH (ref 0.61–1.24)
GFR, Estimated: 49 mL/min — ABNORMAL LOW (ref >60)
Glucose, Bld: 116 mg/dL — ABNORMAL HIGH (ref 70–99)
Potassium: 4.3 mmol/L (ref 3.5–5.1)
Sodium: 137 mmol/L (ref 135–145)

## 2022-06-19 LAB — FUNGUS CULTURE RESULT

## 2022-06-19 LAB — HEPARIN LEVEL (UNFRACTIONATED): Heparin Unfractionated: <0.1 IU/mL — ABNORMAL LOW (ref 0.30–0.70)

## 2022-06-19 LAB — LACTATE DEHYDROGENASE: LDH: 210 U/L — ABNORMAL HIGH (ref 98–192)

## 2022-06-19 LAB — FUNGUS CULTURE WITH STAIN

## 2022-06-19 LAB — FUNGAL ORGANISM REFLEX

## 2022-06-19 MED ORDER — ROSUVASTATIN CALCIUM 20 MG PO TABS
20.0000 mg | ORAL_TABLET | Freq: Every day | ORAL | Status: DC
  Administered 2022-06-19 – 2022-07-18 (×30): 20 mg via ORAL
  Filled 2022-06-19 (×30): qty 1

## 2022-06-19 MED ORDER — CEFAZOLIN SODIUM-DEXTROSE 2-4 GM/100ML-% IV SOLN
2.0000 g | INTRAVENOUS | Status: AC
  Administered 2022-06-20: 2 g via INTRAVENOUS
  Filled 2022-06-19: qty 100

## 2022-06-19 MED ORDER — WARFARIN SODIUM 7.5 MG PO TABS
7.5000 mg | ORAL_TABLET | Freq: Once | ORAL | Status: AC
  Administered 2022-06-19: 7.5 mg via ORAL
  Filled 2022-06-19: qty 1

## 2022-06-19 NOTE — TOC CM/SW Note (Signed)
HF TOC CM contacted [name] and [name] rep, Marylene Land to make aware of scheduled dc home tomorrow on IV abx. Will need resumption of HH RN orders and OPAT orders. Isidoro Donning RN3 CCM, Heart Failure TOC CM [phone]

## 2022-06-19 NOTE — Progress Notes (Signed)
5 Days Post-Op Procedure(s) (LRB): DEBRIDEMENT OF VAD POWERCORD TUNNEL (Left) APPLICATION OF WOUND VAC (Left) Subjective: No complaints- abd pain imroved  Objective: Vital signs in last 24 hours: Temp:  [97.6 F (36.4 C)-99.2 F (37.3 C)] 97.6 F (36.4 C) (07/12 0751) Pulse Rate:  [81-90] 90 (07/12 0751) Cardiac Rhythm: Normal sinus rhythm (07/12 0702) Resp:  [14-21] 21 (07/12 0751) BP: (87-129)/(74-99) 117/90 (07/12 0751) SpO2:  [93 %-96 %] 93 % (07/12 0322) Weight:  [82.9 kg] 82.9 kg (07/12 0500)  Hemodynamic parameters for last 24 hours:   nsr Intake/Output from previous day: 07/11 0701 - 07/12 0700 In: 558.6 [P.O.:240; I.V.:127.9; IV Piggyback:190.8] Out: 1600 [Urine:1600] Intake/Output this shift: No intake/output data recorded.  EXAM  Wound VAC sponge compressed Min serosanguinous drainage in canister Normal VAD Hum  Lab Results: Recent Labs    06/18/22 0330 06/19/22 0340  WBC 9.9 10.2  HGB 8.3* 8.2*  HCT 26.0* 26.2*  PLT 299 297   BMET:  Recent Labs    06/18/22 0330 06/19/22 0340  NA 138 137  K 4.5 4.3  CL 101 101  CO2 26 26  GLUCOSE 162* 116*  BUN 28* 32*  CREATININE 1.47* 1.58*  CALCIUM 8.9 8.7*    PT/INR:  Recent Labs    06/19/22 0340  LABPROT 13.9  INR 1.1   ABG    Component Value Date/Time   PHART 7.508 (H) 11/20/2020 0438   HCO3 24.8 09/09/2021 0313   TCO2 29 11/20/2020 0438   ACIDBASEDEF 6.0 (H) 11/24/2019 1718   O2SAT 63.4 09/09/2021 0313   CBG (last 3)  Recent Labs    06/18/22 1626 06/18/22 2112 06/19/22 0628  GLUCAP 211* 160* 101*    Assessment/Plan: S/P Procedure(s) (LRB): DEBRIDEMENT OF VAD POWERCORD TUNNEL (Left) APPLICATION OF WOUND VAC (Left) Plan wound VAC change in OR tomorrow, time TBD Procedure discussed with patient   LOS: 7 days    Lovett Sox 06/19/2022

## 2022-06-19 NOTE — Progress Notes (Signed)
LVAD Coordinator Rounding Note:  Admitted 06/12/22 to Dr. Alford Highland service due to drive line debridement scheduled with Dr Donata Clay 06/14/22.   Pt sitting up in chair at bedside, getting ready to walk.   Pt reports he had pain from DL site laterally to right side/back last night that was relieved with a "shot of morphine". Pt denies pain this am.   Vital signs: Temp:  97.6 HR: 90 Doppler Pressure: not documented Automatic BP: 117/90 (99) O2 Sat: 94% on RA Wt: 171.7>174.6>178.6>182.9>181>182.3>182.7 lbs   LVAD interrogation reveals:  Speed: 5600 Flow:  4.4 Power:  4.1w PI:  3.8 Alarms: none Events:  pt on batteries  Fixed speed:  5600 Low speed limit:  5300  Drive Line:  Wound vac in place with 125 suction; no leaks noted; draining serosanguinous fluid. Anchor intact to drive line. Planned wound vac change Thursday, 06/20/22 per Dr. Donata Clay.   Labs:  LDH trend: 249>244>208>22>229>199>210  INR trend: 1.3 >1.3>1.2>1.1>1.1>1.1>1.1  Anticoagulation Plan: -INR Goal: 2.0 - 2.5 -ASA Dose: none  ICD: N/A  Drips:  Heparin - 500 units/hr  Infection:  06/07/22>> repeat drive line culture>> abundant MRSA; final  Plan/Recommendations:  Page VAD coordinators for equipment or drive line issues Plan for drive line debridement in OR per Dr Donata Clay Thursday, 06/20/22. VAD coordinator will accompany pt.   Hessie Diener RN VAD Coordinator  Office: [phone]  24/7 Pager: [phone]

## 2022-06-19 NOTE — Progress Notes (Signed)
ANTICOAGULATION CONSULT NOTE  Pharmacy Consult for heparin + warfarin Indication:  LVAD  No Known Allergies  Patient Measurements: Height: 5\' 10"  (177.8 cm) Weight: 82.9 kg (182 lb 12.2 oz) IBW/kg (Calculated) : 73 Heparin Dosing Weight: 80kg  Vital Signs: Temp: 97.6 F (36.4 C) (07/12 0751) Temp Source: Oral (07/12 0751) BP: 117/90 (07/12 0751) Pulse Rate: 90 (07/12 0751)  Labs: Recent Labs    06/17/22 0400 06/18/22 0330 06/19/22 0340  HGB 8.2* 8.3* 8.2*  HCT 26.0* 26.0* 26.2*  PLT 291 299 297  LABPROT 13.9 14.0 13.9  INR 1.1 1.1 1.1  HEPARINUNFRC <0.10* <0.10* <0.10*  CREATININE 1.65* 1.47* 1.58*  CKTOTAL  --  125  --      Estimated Creatinine Clearance: 50.7 mL/min (A) (by C-G formula based on SCr of 1.58 mg/dL (H)).   Medical History: Past Medical History:  Diagnosis Date   "    Arthritis    CAD (coronary artery disease)    a. s/p CABG in 11/2019 with LIMA-LAD, SVG-OM1, SVG-PDA and SVG-D1   CHF (congestive heart failure) (HCC)    a. EF < 20% by echo in 11/2019   Essential hypertension    PAF (paroxysmal atrial fibrillation) (HCC)    Type 2 diabetes mellitus (HCC)     Assessment: 31 yoM with HM3 LVAD admitted with ongoing LVAD driveline infection. Pt with planned I&D in OR 7/7. Pt is on warfarin PTA with last dose taken 7/3.   INR remains subtherapeutic at 1.1 despite boosted dosing. CBC and LDH stable. Heparin level <0.1 as expected. Discussed with Dr. 9/7 - ok to continue warfarin with upcoming wound vac change, keep INR 2.2 or less. Will give boosted dose tonight to facilitate getting pt off heparin drip.  *Home warfarin dose 6mg  MWF, 4mg  AODs  Goal of Therapy:  INR goal 2-2.5 Heparin level ~0.3 units/ml Monitor platelets by anticoagulation protocol: Yes   Plan:  -Warfarin 7.5mg  PO x1 again tonight -Continue heparin at 500 units/hr - stop on call to OR -Daily INR, heparin level, CBC, LDH   Donata Clay, PharmD, [place],  [facility] Clinical Pharmacist [phone] Please check AMION for all [facility] Pharmacy numbers 06/19/2022

## 2022-06-19 NOTE — Plan of Care (Signed)
  Problem: Education: Goal: Knowledge of General Education information will improve Description: Including pain rating scale, medication(s)/side effects and non-pharmacologic comfort measures Outcome: Progressing   Problem: Health Behavior/Discharge Planning: Goal: Ability to manage health-related needs will improve Outcome: Progressing   Problem: Clinical Measurements: Goal: Ability to maintain clinical measurements within normal limits will improve Outcome: Progressing Goal: Will remain free from infection Outcome: Progressing Goal: Diagnostic test results will improve Outcome: Progressing Goal: Respiratory complications will improve Outcome: Progressing Goal: Cardiovascular complication will be avoided Outcome: Progressing   Problem: Activity: Goal: Risk for activity intolerance will decrease Outcome: Progressing   Problem: Nutrition: Goal: Adequate nutrition will be maintained Outcome: Progressing   Problem: Coping: Goal: Level of anxiety will decrease Outcome: Progressing   Problem: Elimination: Goal: Will not experience complications related to bowel motility Outcome: Progressing Goal: Will not experience complications related to urinary retention Outcome: Progressing   Problem: Pain Managment: Goal: General experience of comfort will improve Outcome: Progressing   Problem: Safety: Goal: Ability to remain free from injury will improve Outcome: Progressing   Problem: Skin Integrity: Goal: Risk for impaired skin integrity will decrease Outcome: Progressing   Problem: Education: Goal: Ability to describe self-care measures that may prevent or decrease complications (Diabetes Survival Skills Education) will improve Outcome: Progressing Goal: Individualized Educational Video(s) Outcome: Progressing   Problem: Coping: Goal: Ability to adjust to condition or change in health will improve Outcome: Progressing   Problem: Fluid Volume: Goal: Ability to  maintain a balanced intake and output will improve Outcome: Progressing   Problem: Health Behavior/Discharge Planning: Goal: Ability to identify and utilize available resources and services will improve Outcome: Progressing Goal: Ability to manage health-related needs will improve Outcome: Progressing   Problem: Metabolic: Goal: Ability to maintain appropriate glucose levels will improve Outcome: Progressing   Problem: Nutritional: Goal: Maintenance of adequate nutrition will improve Outcome: Progressing Goal: Progress toward achieving an optimal weight will improve Outcome: Progressing   

## 2022-06-19 NOTE — Progress Notes (Addendum)
Patient ID: Kevin Underwood, male   DOB: 16-Apr-1961, 61 y.o.   MRN: [id number]   Advanced Heart Failure VAD Team Note  PCP-Cardiologist: Rozann Lesches, MD   Subjective:    S/p driveline site debridement and wound vac on 7/7.  7/10 Had Ramp ECHO. Speed optimized.   Back to OR for wound vac change tomorrow.  On heparin drip. INR 1.1.  Crestor and daptomycin stopped w/ elevated CK level, no muscle pain.  CK 1000 => 757 => 598=>125.   Now on vancomycin.    SCr stable, 1.87=>1.77=>1.65=>1.47=>1.58  Feeling well today. Had a little bit of incisional pain near driveline last night but no recurrence this am. Has been up ambulating.  LVAD INTERROGATION:  HeartMate III LVAD:   Flow 3.9 liters/min, speed 5600, power 4, PI 4.2.   Objective:    Vital Signs:   Temp:  [97.6 F (36.4 C)-99.2 F (37.3 C)] 97.6 F (36.4 C) (07/12 0751) Pulse Rate:  [81-90] 90 (07/12 0751) Resp:  [14-21] 21 (07/12 0751) BP: (87-129)/(74-99) 117/90 (07/12 0751) SpO2:  [93 %-96 %] 93 % (07/12 0322) Weight:  [82.9 kg] 82.9 kg (07/12 0500) Last BM Date : 06/17/22 Mean arterial Pressure  90s-100s Intake/Output:   Intake/Output Summary (Last 24 hours) at 06/19/2022 0852 Last data filed at 06/19/2022 0659 Gross per 24 hour  Intake 540.7 ml  Output 1600 ml  Net -1059.3 ml     Physical Exam  Physical Exam: GENERAL: Well appearing, sitting up in chair HEENT: normal  NECK: Supple, No JVD  2+ bilaterally, no bruits.   CARDIAC:  Mechanical heart sounds with LVAD hum present.  LUNGS:  Clear to auscultation bilaterally.  ABDOMEN:  Soft, round, nontender, positive bowel sounds x4.     LVAD exit site: Wound VAC present. Stabilization device present and accurately applied.   EXTREMITIES:  Warm and dry, no cyanosis, clubbing, rash or edema  NEUROLOGIC:  Alert and oriented x 4.  Gait steady.  No aphasia.  No dysarthria.  Affect pleasant.         Telemetry   SR 70s-80s  Labs   Basic Metabolic  Panel: Recent Labs  Lab 06/15/22 0335 06/16/22 0349 06/17/22 0400 06/18/22 0330 06/19/22 0340  NA 135 135 136 138 137  K 4.0 4.5 4.3 4.5 4.3  CL 98 100 100 101 101  CO2 26 25 26 26 26   GLUCOSE 201* 237* 215* 162* 116*  BUN 29* 29* 27* 28* 32*  CREATININE 1.86* 1.77* 1.65* 1.47* 1.58*  CALCIUM 8.7* 8.8* 8.8* 8.9 8.7*    Liver Function Tests: Recent Labs  Lab 06/12/22 1230  AST 68*  ALT 86*  ALKPHOS 114  BILITOT 0.2*  PROT 6.9  ALBUMIN 2.7*   No results for input(s): "LIPASE", "AMYLASE" in the last 168 hours. No results for input(s): "AMMONIA" in the last 168 hours.  CBC: Recent Labs  Lab 06/12/22 1230 06/13/22 0603 06/15/22 0335 06/16/22 0349 06/17/22 0400 06/18/22 0330 06/19/22 0340  WBC 10.5   < > 12.4* 10.1 9.0 9.9 10.2  NEUTROABS 8.0*  --   --   --   --   --   --   HGB 9.5*   < > 7.5* 8.0* 8.2* 8.3* 8.2*  HCT 28.6*   < > 24.1* 25.7* 26.0* 26.0* 26.2*  MCV 82.2   < > 84.3 84.3 84.4 83.6 84.2  PLT 385   < > 278 294 291 299 297   < > = values in this  interval not displayed.    INR: Recent Labs  Lab 06/15/22 0335 06/16/22 0349 06/17/22 0400 06/18/22 0330 06/19/22 0340  INR 1.2 1.1 1.1 1.1 1.1    Other results: EKG:    Imaging   ECHOCARDIOGRAM LIMITED  Result Date: 06/17/2022    ECHOCARDIOGRAM LIMITED REPORT   Patient Name:   Kevin Underwood Date of Exam: 06/17/2022 Medical Rec #:  [id number]        Height:       70.0 in Accession #:    [id number]       Weight:       181.2 lb Date of Birth:  1961/04/15        BSA:          2.002 m Patient Age:    18 years         BP:           0/0 mmHg Patient Gender: M                HR:           69 bpm. Exam Location:  Inpatient Procedure: Limited Echo, Limited Color Doppler and Cardiac Doppler Indications:    CHF-Acute Systolic [id number]  History:        Patient has prior history of Echocardiogram examinations, most                 recent 01/10/2022. CHF, CAD, Arrythmias:Atrial Fibrillation; Risk                  Factors:Diabetes and Hypertension.  Sonographer:    Bernadene Person RDCS Referring Phys: [place]  1. LVAD present at LV apex. Speed increased from 5500 to 5700. Septum mildly shifted to the right at 5500, shifted more leftward at 5700. Speed left at 5600 rpm at end of study. Left ventricular ejection fraction, by estimation, is 20 to 25%. The left ventricle has severely decreased function. The left ventricle demonstrates global hypokinesis. There is mild left ventricular hypertrophy. Left ventricular diastolic parameters are indeterminate.  2. Right ventricular systolic function is mildly reduced. The right ventricular size is moderately enlarged. Tricuspid regurgitation signal is inadequate for assessing PA pressure.  3. Left atrial size was mildly dilated.  4. Right atrial size was mildly dilated.  5. The mitral valve is normal in structure. Mild mitral valve regurgitation. No evidence of mitral stenosis.  6. The aortic valve opened every beat at 5500 rpm, every other beat at 5700 rpm. The aortic valve is tricuspid. Aortic valve regurgitation is not visualized. No aortic stenosis is present.  7. The inferior vena cava is normal in size with greater than 50% respiratory variability, suggesting right atrial pressure of 3 mmHg.  8. LVAD ramp study. FINDINGS  Left Ventricle: LVAD present at LV apex. Speed increased from 5500 to 5700. Septum mildly shifted to the right at 5500, shifted more leftward at 5700. Speed left at 5600 rpm at end of study. Left ventricular ejection fraction, by estimation, is 20 to 25%. The left ventricle has severely decreased function. The left ventricle demonstrates global hypokinesis. The left ventricular internal cavity size was normal in size. There is mild left ventricular hypertrophy. Left ventricular diastolic parameters are indeterminate. Right Ventricle: The right ventricular size is moderately enlarged. Right ventricular systolic function is mildly reduced.  Tricuspid regurgitation signal is inadequate for assessing PA pressure. Left Atrium: Left atrial size was mildly dilated. Right Atrium: Right atrial size was mildly dilated. Mitral  Valve: The mitral valve is normal in structure. Mild mitral valve regurgitation. No evidence of mitral valve stenosis. Tricuspid Valve: The tricuspid valve is normal in structure. Tricuspid valve regurgitation is not demonstrated. Aortic Valve: The aortic valve opened every beat at 5500 rpm, every other beat at 5700 rpm. The aortic valve is tricuspid. Aortic valve regurgitation is not visualized. No aortic stenosis is present. Pulmonic Valve: The pulmonic valve was normal in structure. Pulmonic valve regurgitation is not visualized. Venous: The inferior vena cava is normal in size with greater than 50% respiratory variability, suggesting right atrial pressure of 3 mmHg. IAS/Shunts: No atrial level shunt detected by color flow Doppler. RIGHT VENTRICLE RV S prime:     7.54 cm/s PULMONIC VALVE RVOT Peak grad: 2 mmHg   SHUNTS Pulmonic VTI: 0.131 m Chrisotpher Rivero McleanMD Electronically signed by Franki Monte Signature Date/Time: 06/17/2022/10:52:18 AM    Final      Medications:     Scheduled Medications:  allopurinol  100 mg Oral Daily   Chlorhexidine Gluconate Cloth  6 each Topical Daily   docusate sodium  200 mg Oral BID   gabapentin  600 mg Oral BID   guaiFENesin  600 mg Oral BID   hydrALAZINE  25 mg Oral TID   insulin aspart  0-15 Units Subcutaneous TID WC   insulin aspart  0-5 Units Subcutaneous QHS   insulin detemir  15 Units Subcutaneous QHS   lactose free nutrition  237 mL Oral TID WC   loratadine  10 mg Oral Daily   methimazole  20 mg Oral BID   metoprolol succinate  25 mg Oral Daily   pantoprazole  40 mg Oral Daily   sertraline  50 mg Oral Daily   tamsulosin  0.4 mg Oral Daily   traZODone  150 mg Oral QHS   Warfarin - Pharmacist Dosing Inpatient   Does not apply q1600    Infusions:   ceFAZolin (ANCEF) IV      heparin 500 Units/hr (06/19/22 0508)   vancomycin Stopped (06/18/22 2248)    PRN Medications: acetaminophen, albuterol, morphine injection, ondansetron (ZOFRAN) IV, mouth rinse, traMADol   Patient Profile   61 y.o. male with history of HM III LVAD, chronic systolic CHF/ICM, COPD, PAF. 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   Readmitted for driveline infection and surgical debridement/possible wound vac placement.  Assessment/Plan:    1. Driveline infection: Recent driveline infection [id number]. S/p driveline site debridement 6/8. Surgical wound cultures grew MRSA, repeat debridement 6/23. Driveline debridement with wound vac placement on 7/7.  - Transitioned from daptomycin to vancomycin due to elevated CK.  CK trending down, so far no problems with vancomycin. Vancomycin planned x 6 wks.  - Back to OR for wound vac change on Thursday.  - On heparin drip and warfarin. INR 1.1 2.  Chronic systolic CHF: Echo [id number] with EF 20-25%, mildly decreased RV function. LHC/RHC in 12/21 with patent grafts, low output. Suspect mixed ischemic/nonischemic cardiomyopathy (prior heavy ETOH and drugs as well as CAD).  No ETOH, drugs, smoking since CABG in 12/20. Admitted with cardiogenic shock in 12/21, had placement of Impella 5.5 initially, now s/p Heartmate 3 LVAD on 11/17/20.  Ramp echo 2/23 with speed decreased to 5500 rpm. He would eventually like heart transplant. NYHA class II. Have been allowing MAP to run higher due to recent AKI thought to be due to lowering BP too much in setting of right renal artery stenosis.  - Had  Ramp ECHO with speed optimized.  MAP 90-100s. INR 1.1 today on warfarin/low dose heparin. LDH stable 210 - Continue hydralazine 25 mg TID - Continue Toprol XL 25 mg daily.  - Volume status stable.   - Continue warfarin/low dose heparin.  3. AKI: Creatinine up to 4.6 last admission, initially thought this was ATN from low BP but he was never  markedly hypotensive and LFTs without any evidence of shock liver. ? Vanc toxicity (troughs were ok). Taken off Entresto and hydralazine. Volume repleted with IVF. In reviewing prior CTA abdomen/pelvis, he has high-grade stenosis of proximal right renal artery. D/w Renal and feel like he may need higher perfusion pressures. Creatinine down to 1.58 - Would consider reassessment of renal artery stenosis and possible treatment once creatinine has improved to baseline.  - Watch MAPs, avoid hypotension 4.  CAD: S/p CABG 12/20.  LHC pre-VAD with patent grafts, no target for intervention. - Stopped statin with elevated CK => this was in setting of daptomycin. Should be able to eventually restart statin but would allow CK to normalize first. 5.Atrial fibrillation: Paroxysmal.  S/p Maze and LA appendage clip with CABG in 12/20. Maintaining NSR.  - Continue Toprol XL 25 mg daily.  - Off amiodarone with hyperthyroidism. - Warfarin + heparin per above  6.  Type 2 diabetes: Takes glipizide at home.  - SSI  - Continue Levemir 15 units qHS - Diabetes coordinator following 7. 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  8. 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  He missed his endocrinology appt. Has been rescheduled for July 27th. - He is now off amiodarone.   - Continue methimazole 20 mg bid, TSH was mildly elevated.   9.  Gout: On allopurinol.  10. Anemia: Hgb 8.2 today.  - Transfuse hgb < 7.   Continue dressing changes per Dr Maren Beach. VAC change on 07/13.  Reengage HF CM/SW to coordinate HH, IV antibiotics and wound vac.   I reviewed the LVAD parameters from today, and compared the results to the patient's prior recorded data.  No programming changes were made.  The LVAD is functioning within specified parameters.  The patient performs LVAD self-test daily.  LVAD interrogation was negative for any significant power changes, alarms or PI events/speed drops.   LVAD equipment check completed and is in good working order.  Back-up equipment present.   LVAD education done on emergency procedures and precautions and reviewed exit site care.  Length of Stay: 7  FINCH, LINDSAY N, PA-C 06/19/2022, 8:52 AM  VAD Team --- VAD ISSUES ONLY--- Pager [phone] (7am - 7am)  Advanced Heart Failure Team  Pager [phone] (M-F; 7a - 5p)  Please contact [facility] for night-coverage after hours (5p -7a ) and weekends on [email or web address]  Patient seen with PA, agree with the above not.   No complaints this morning.  Creatinine stable 1.58.  MAP 70s-90s.  Tolerating vancomycin.   General: Well appearing this am. NAD.  HEENT: Normal. Neck: Supple, JVP 7-8 cm. Carotids OK.  Cardiac:  Mechanical heart sounds with LVAD hum present.  Lungs:  CTAB, normal effort.  Abdomen:  NT, ND, no HSM. No bruits or masses. +BS  LVAD exit site: Well-healed and incorporated. Dressing dry and intact. No erythema or drainage. Stabilization device present and accurately applied. Driveline dressing changed daily per sterile technique. Extremities:  Warm and dry. No cyanosis, clubbing, rash, or edema.  Neuro:  Alert & oriented x 3. Cranial nerves grossly intact.  Moves all 4 extremities w/o difficulty. Affect pleasant    Continue vancomycin IV.  Will go for wound vac change tomorrow.  Stable LVAD parameters.   Restart statin today with normalization of CK.  Recheck CK prior to discharge.   Orva Gwaltney McLeant 06/19/2022 9:28 AM

## 2022-06-19 NOTE — Plan of Care (Signed)
  Problem: Education: Goal: Knowledge of General Education information will improve Description: Including pain rating scale, medication(s)/side effects and non-pharmacologic comfort measures Outcome: Progressing   Problem: Health Behavior/Discharge Planning: Goal: Ability to manage health-related needs will improve Outcome: Progressing   Problem: Clinical Measurements: Goal: Ability to maintain clinical measurements within normal limits will improve Outcome: Progressing Goal: Respiratory complications will improve Outcome: Progressing Goal: Cardiovascular complication will be avoided Outcome: Progressing   Problem: Nutrition: Goal: Adequate nutrition will be maintained Outcome: Progressing   Problem: Coping: Goal: Level of anxiety will decrease Outcome: Progressing   Problem: Pain Managment: Goal: General experience of comfort will improve Outcome: Progressing   Problem: Skin Integrity: Goal: Risk for impaired skin integrity will decrease Outcome: Progressing   Problem: Nutritional: Goal: Maintenance of adequate nutrition will improve Outcome: Progressing   Problem: Activity: Goal: Capacity to carry out activities will improve Outcome: Progressing

## 2022-06-19 NOTE — Anesthesia Preprocedure Evaluation (Signed)
Anesthesia Evaluation  Patient identified by MRN, date of birth, ID band Patient awake    Reviewed: Allergy & Precautions, H&P , NPO status , Patient's Chart, lab work & pertinent test results  Airway Mallampati: II  TM Distance: >3 FB Neck ROM: Full    Dental no notable dental hx. (+) Edentulous Upper, Edentulous Lower, Dental Advisory Given   Pulmonary sleep apnea , COPD,  COPD inhaler, former smoker,    Pulmonary exam normal breath sounds clear to auscultation       Cardiovascular Exercise Tolerance: Good hypertension, Pt. on medications and Pt. on home beta blockers + CAD, + CABG and +CHF  + dysrhythmias Atrial Fibrillation  Rhythm:Regular Rate:Normal  LVAD   Neuro/Psych negative neurological ROS  negative psych ROS   GI/Hepatic negative GI ROS, Neg liver ROS,   Endo/Other  diabetesHyperthyroidism   Renal/GU Renal InsufficiencyRenal disease  negative genitourinary   Musculoskeletal   Abdominal   Peds  Hematology negative hematology ROS (+)   Anesthesia Other Findings   Reproductive/Obstetrics negative OB ROS                            Anesthesia Physical Anesthesia Plan  ASA: 4  Anesthesia Plan: General   Post-op Pain Management: Tylenol PO (pre-op)*   Induction: Intravenous  PONV Risk Score and Plan: 3 and Ondansetron, Dexamethasone and Midazolam  Airway Management Planned: Oral ETT  Additional Equipment:   Intra-op Plan:   Post-operative Plan: Extubation in OR  Informed Consent: I have reviewed the patients History and Physical, chart, labs and discussed the procedure including the risks, benefits and alternatives for the proposed anesthesia with the patient or authorized representative who has indicated his/her understanding and acceptance.     Dental advisory given  Plan Discussed with: CRNA  Anesthesia Plan Comments:        Anesthesia Quick Evaluation

## 2022-06-20 ENCOUNTER — Inpatient Hospital Stay: Payer: 59 | Admitting: Internal Medicine

## 2022-06-20 ENCOUNTER — Inpatient Hospital Stay (HOSPITAL_COMMUNITY): Payer: 59 | Admitting: Anesthesiology

## 2022-06-20 ENCOUNTER — Other Ambulatory Visit: Payer: Self-pay

## 2022-06-20 ENCOUNTER — Encounter (HOSPITAL_COMMUNITY)
Admission: AD | Disposition: A | Discharge status: Home / Self Care | Payer: Self-pay | Source: Home / Self Care | Attending: Cardiology

## 2022-06-20 ENCOUNTER — Encounter (HOSPITAL_COMMUNITY): Payer: Self-pay | Admitting: Cardiology

## 2022-06-20 DIAGNOSIS — I5023 Acute on chronic systolic (congestive) heart failure: Secondary | ICD-10-CM | POA: Diagnosis not present

## 2022-06-20 DIAGNOSIS — Z87891 Personal history of nicotine dependence: Secondary | ICD-10-CM

## 2022-06-20 DIAGNOSIS — I110 Hypertensive heart disease with heart failure: Secondary | ICD-10-CM | POA: Diagnosis not present

## 2022-06-20 DIAGNOSIS — I2510 Atherosclerotic heart disease of native coronary artery without angina pectoris: Secondary | ICD-10-CM

## 2022-06-20 DIAGNOSIS — Z95811 Presence of heart assist device: Secondary | ICD-10-CM | POA: Diagnosis not present

## 2022-06-20 DIAGNOSIS — T827XXA Infection and inflammatory reaction due to other cardiac and vascular devices, implants and grafts, initial encounter: Secondary | ICD-10-CM

## 2022-06-20 HISTORY — PX: APPLICATION OF WOUND VAC: SHX5189

## 2022-06-20 LAB — CBC
HCT: 26.2 % — ABNORMAL LOW (ref 39.0–52.0)
Hemoglobin: 8.3 g/dL — ABNORMAL LOW (ref 13.0–17.0)
MCH: 26.9 pg (ref 26.0–34.0)
MCHC: 31.7 g/dL (ref 30.0–36.0)
MCV: 85.1 fL (ref 80.0–100.0)
Platelets: 283 10*3/uL (ref 150–400)
RBC: 3.08 MIL/uL — ABNORMAL LOW (ref 4.22–5.81)
RDW: 17.9 % — ABNORMAL HIGH (ref 11.5–15.5)
WBC: 10.2 10*3/uL (ref 4.0–10.5)
nRBC: 0 % (ref 0.0–0.2)

## 2022-06-20 LAB — GLUCOSE, CAPILLARY
Glucose-Capillary: 110 mg/dL — ABNORMAL HIGH (ref 70–99)
Glucose-Capillary: 113 mg/dL — ABNORMAL HIGH (ref 70–99)
Glucose-Capillary: 121 mg/dL — ABNORMAL HIGH (ref 70–99)
Glucose-Capillary: 141 mg/dL — ABNORMAL HIGH (ref 70–99)
Glucose-Capillary: 345 mg/dL — ABNORMAL HIGH (ref 70–99)
Glucose-Capillary: 205 mg/dL — ABNORMAL HIGH (ref 70–99)

## 2022-06-20 LAB — LACTATE DEHYDROGENASE: LDH: 189 U/L (ref 98–192)

## 2022-06-20 LAB — BASIC METABOLIC PANEL
Anion gap: 8 (ref 5–15)
BUN: 29 mg/dL — ABNORMAL HIGH (ref 8–23)
CO2: 26 mmol/L (ref 22–32)
Calcium: 8.7 mg/dL — ABNORMAL LOW (ref 8.9–10.3)
Chloride: 104 mmol/L (ref 98–111)
Creatinine, Ser: 1.48 mg/dL — ABNORMAL HIGH (ref 0.61–1.24)
GFR, Estimated: 53 mL/min — ABNORMAL LOW (ref >60)
Glucose, Bld: 113 mg/dL — ABNORMAL HIGH (ref 70–99)
Potassium: 4.2 mmol/L (ref 3.5–5.1)
Sodium: 138 mmol/L (ref 135–145)

## 2022-06-20 LAB — HEPARIN LEVEL (UNFRACTIONATED): Heparin Unfractionated: <0.1 IU/mL — ABNORMAL LOW (ref 0.30–0.70)

## 2022-06-20 LAB — PROTIME-INR
INR: 1.2 (ref 0.8–1.2)
Prothrombin Time: 15.1 seconds (ref 11.4–15.2)

## 2022-06-20 SURGERY — APPLICATION, WOUND VAC
Anesthesia: General | Site: Abdomen

## 2022-06-20 MED ORDER — VASOPRESSIN 20 UNIT/ML IV SOLN
INTRAVENOUS | Status: DC | PRN
  Administered 2022-06-20 (×3): 1 [IU] via INTRAVENOUS

## 2022-06-20 MED ORDER — FENTANYL CITRATE (PF) 100 MCG/2ML IJ SOLN
INTRAMUSCULAR | Status: AC
  Filled 2022-06-20: qty 2

## 2022-06-20 MED ORDER — ACETAMINOPHEN 500 MG PO TABS: 1000.0000 mg | ORAL_TABLET | Freq: Once | ORAL | Status: DC

## 2022-06-20 MED ORDER — VANCOMYCIN HCL 1000 MG IV SOLR
INTRAVENOUS | Status: AC
  Filled 2022-06-20: qty 20

## 2022-06-20 MED ORDER — LIDOCAINE 2% (20 MG/ML) 5 ML SYRINGE
INTRAMUSCULAR | Status: DC | PRN
  Administered 2022-06-20: 60 mg via INTRAVENOUS

## 2022-06-20 MED ORDER — WARFARIN SODIUM 3 MG PO TABS
6.0000 mg | ORAL_TABLET | Freq: Once | ORAL | Status: AC
  Administered 2022-06-20: 6 mg via ORAL
  Filled 2022-06-20: qty 2

## 2022-06-20 MED ORDER — FENTANYL CITRATE (PF) 250 MCG/5ML IJ SOLN
INTRAMUSCULAR | Status: DC | PRN
  Administered 2022-06-20 (×2): 50 ug via INTRAVENOUS
  Administered 2022-06-20: 100 ug via INTRAVENOUS
  Administered 2022-06-20: 50 ug via INTRAVENOUS

## 2022-06-20 MED ORDER — MIDAZOLAM HCL 2 MG/2ML IJ SOLN
INTRAMUSCULAR | Status: AC
  Filled 2022-06-20: qty 2

## 2022-06-20 MED ORDER — DEXAMETHASONE SODIUM PHOSPHATE 10 MG/ML IJ SOLN
INTRAMUSCULAR | Status: AC
  Filled 2022-06-20: qty 1

## 2022-06-20 MED ORDER — SODIUM CHLORIDE 0.9 % IR SOLN
Status: DC | PRN
  Administered 2022-06-20: 1

## 2022-06-20 MED ORDER — HEPARIN (PORCINE) 25000 UT/250ML-% IV SOLN
500.0000 [IU]/h | INTRAVENOUS | Status: DC
  Administered 2022-06-20 – 2022-06-26 (×5): 500 [IU]/h via INTRAVENOUS
  Filled 2022-06-20 (×4): qty 250

## 2022-06-20 MED ORDER — PHENYLEPHRINE 80 MCG/ML (10ML) SYRINGE FOR IV PUSH (FOR BLOOD PRESSURE SUPPORT)
PREFILLED_SYRINGE | INTRAVENOUS | Status: AC
  Filled 2022-06-20: qty 10

## 2022-06-20 MED ORDER — SODIUM CHLORIDE (PF) 0.9 % IJ SOLN
INTRAMUSCULAR | Status: AC
Start: 2022-06-20 — End: ?
  Filled 2022-06-20: qty 20

## 2022-06-20 MED ORDER — VANCOMYCIN HCL 1000 MG IV SOLR: INTRAVENOUS | Status: DC | PRN

## 2022-06-20 MED ORDER — ROCURONIUM BROMIDE 10 MG/ML (PF) SYRINGE
PREFILLED_SYRINGE | INTRAVENOUS | Status: AC
  Filled 2022-06-20: qty 10

## 2022-06-20 MED ORDER — FENTANYL CITRATE (PF) 100 MCG/2ML IJ SOLN
25.0000 ug | INTRAMUSCULAR | Status: DC | PRN
  Administered 2022-06-20: 25 ug via INTRAVENOUS
  Administered 2022-06-20: 50 ug via INTRAVENOUS
  Administered 2022-06-20: 25 ug via INTRAVENOUS

## 2022-06-20 MED ORDER — ONDANSETRON HCL 4 MG/2ML IJ SOLN
INTRAMUSCULAR | Status: DC | PRN
  Administered 2022-06-20: 4 mg via INTRAVENOUS

## 2022-06-20 MED ORDER — FENTANYL CITRATE (PF) 250 MCG/5ML IJ SOLN
INTRAMUSCULAR | Status: AC
  Filled 2022-06-20: qty 5

## 2022-06-20 MED ORDER — DEXAMETHASONE SODIUM PHOSPHATE 10 MG/ML IJ SOLN
INTRAMUSCULAR | Status: DC | PRN
  Administered 2022-06-20: 5 mg via INTRAVENOUS

## 2022-06-20 MED ORDER — ALBUMIN HUMAN 5 % IV SOLN: INTRAVENOUS | Status: DC | PRN

## 2022-06-20 MED ORDER — ETOMIDATE 2 MG/ML IV SOLN
INTRAVENOUS | Status: AC
  Filled 2022-06-20: qty 10

## 2022-06-20 MED ORDER — LACTATED RINGERS IV SOLN: INTRAVENOUS | Status: DC | PRN

## 2022-06-20 MED ORDER — MIDAZOLAM HCL 2 MG/2ML IJ SOLN
INTRAMUSCULAR | Status: DC | PRN
  Administered 2022-06-20 (×2): 1 mg via INTRAVENOUS

## 2022-06-20 MED ORDER — PROPOFOL 10 MG/ML IV BOLUS
INTRAVENOUS | Status: AC
  Filled 2022-06-20: qty 20

## 2022-06-20 MED ORDER — HEMOSTATIC AGENTS (NO CHARGE) OPTIME
TOPICAL | Status: DC | PRN
  Administered 2022-06-20: 1 via TOPICAL

## 2022-06-20 MED ORDER — PROPOFOL 10 MG/ML IV BOLUS
INTRAVENOUS | Status: DC | PRN
  Administered 2022-06-20: 100 mg via INTRAVENOUS

## 2022-06-20 MED ORDER — HYDROMORPHONE HCL 1 MG/ML IJ SOLN: 0.2500 mg | INTRAMUSCULAR | Status: DC | PRN

## 2022-06-20 MED ORDER — ROCURONIUM BROMIDE 10 MG/ML (PF) SYRINGE
PREFILLED_SYRINGE | INTRAVENOUS | Status: DC | PRN
  Administered 2022-06-20: 20 mg via INTRAVENOUS
  Administered 2022-06-20: 40 mg via INTRAVENOUS

## 2022-06-20 MED ORDER — VASOPRESSIN 20 UNIT/ML IV SOLN
INTRAVENOUS | Status: AC
  Filled 2022-06-20: qty 1

## 2022-06-20 MED ORDER — LIDOCAINE 2% (20 MG/ML) 5 ML SYRINGE
INTRAMUSCULAR | Status: AC
  Filled 2022-06-20: qty 5

## 2022-06-20 MED ORDER — PHENYLEPHRINE HCL-NACL 20-0.9 MG/250ML-% IV SOLN
INTRAVENOUS | Status: DC | PRN
  Administered 2022-06-20: 25 ug/min via INTRAVENOUS

## 2022-06-20 MED ORDER — PHENYLEPHRINE 80 MCG/ML (10ML) SYRINGE FOR IV PUSH (FOR BLOOD PRESSURE SUPPORT)
PREFILLED_SYRINGE | INTRAVENOUS | Status: DC | PRN
  Administered 2022-06-20: 320 ug via INTRAVENOUS
  Administered 2022-06-20 (×2): 160 ug via INTRAVENOUS

## 2022-06-20 MED ORDER — ONDANSETRON HCL 4 MG/2ML IJ SOLN
INTRAMUSCULAR | Status: AC
  Filled 2022-06-20: qty 2

## 2022-06-20 MED ORDER — SUGAMMADEX SODIUM 200 MG/2ML IV SOLN
INTRAVENOUS | Status: DC | PRN
  Administered 2022-06-20: 160 mg via INTRAVENOUS

## 2022-06-20 SURGICAL SUPPLY — 66 items
BENZOIN TINCTURE PRP APPL 2/3 (GAUZE/BANDAGES/DRESSINGS) IMPLANT
BLADE CLIPPER SURG (BLADE) ×2 IMPLANT
BLADE SURG 10 STRL SS (BLADE) IMPLANT
BLADE SURG 15 STRL LF DISP TIS (BLADE) IMPLANT
BLADE SURG 15 STRL SS (BLADE)
BNDG GAUZE ELAST 4 BULKY (GAUZE/BANDAGES/DRESSINGS) IMPLANT
CANISTER SUCT 3000ML PPV (MISCELLANEOUS) ×2 IMPLANT
CANISTER WOUND CARE 500ML ATS (WOUND CARE) ×2 IMPLANT
CANISTER WOUNDNEG PRESSURE 500 (CANNISTER) ×1 IMPLANT
CLIP VESOCCLUDE SM WIDE 24/CT (CLIP) IMPLANT
CNTNR URN SCR LID CUP LEK RST (MISCELLANEOUS) IMPLANT
CONT SPEC 4OZ STRL OR WHT (MISCELLANEOUS)
CONTAINER PROTECT SURGISLUSH (MISCELLANEOUS) ×4 IMPLANT
COVER SURGICAL LIGHT HANDLE (MISCELLANEOUS) ×1 IMPLANT
DRAPE INCISE IOBAN 66X45 STRL (DRAPES) ×1 IMPLANT
DRAPE LAPAROSCOPIC ABDOMINAL (DRAPES) ×2 IMPLANT
DRAPE SLUSH/WARMER DISC (DRAPES) IMPLANT
DRSG CUTIMED SORBACT 7X9 (GAUZE/BANDAGES/DRESSINGS) ×1 IMPLANT
DRSG PAD ABDOMINAL 8X10 ST (GAUZE/BANDAGES/DRESSINGS) IMPLANT
DRSG VAC ATS LRG SENSATRAC (GAUZE/BANDAGES/DRESSINGS) ×2 IMPLANT
DRSG VAC ATS MED SENSATRAC (GAUZE/BANDAGES/DRESSINGS) ×2 IMPLANT
DRSG VAC ATS SM SENSATRAC (GAUZE/BANDAGES/DRESSINGS) ×2 IMPLANT
ELECT BLADE 4.0 EZ CLEAN MEGAD (MISCELLANEOUS) ×2
ELECT REM PT RETURN 9FT ADLT (ELECTROSURGICAL) ×2
ELECTRODE BLDE 4.0 EZ CLN MEGD (MISCELLANEOUS) IMPLANT
ELECTRODE REM PT RTRN 9FT ADLT (ELECTROSURGICAL) ×1 IMPLANT
GAUZE 4X4 16PLY ~~LOC~~+RFID DBL (SPONGE) ×2 IMPLANT
GAUZE SPONGE 4X4 12PLY STRL (GAUZE/BANDAGES/DRESSINGS) IMPLANT
GAUZE XEROFORM 5X9 LF (GAUZE/BANDAGES/DRESSINGS) IMPLANT
GLOVE BIO SURGEON STRL SZ 6 (GLOVE) ×1 IMPLANT
GLOVE BIO SURGEON STRL SZ7.5 (GLOVE) ×4 IMPLANT
GLOVE BIOGEL PI IND STRL 6 (GLOVE) IMPLANT
GLOVE BIOGEL PI IND STRL 6.5 (GLOVE) IMPLANT
GLOVE BIOGEL PI INDICATOR 6 (GLOVE) ×1
GLOVE BIOGEL PI INDICATOR 6.5 (GLOVE) ×1
GOWN STRL REUS W/ TWL LRG LVL3 (GOWN DISPOSABLE) ×2 IMPLANT
GOWN STRL REUS W/TWL LRG LVL3 (GOWN DISPOSABLE) ×2
GRAFT MYRIAD 3 LAYER 7X10 (Graft) ×1 IMPLANT
HANDPIECE INTERPULSE COAX TIP (DISPOSABLE) ×1
HEMOSTAT POWDER SURGIFOAM 1G (HEMOSTASIS) IMPLANT
HEMOSTAT SURGICEL 2X14 (HEMOSTASIS) IMPLANT
KIT BASIN OR (CUSTOM PROCEDURE TRAY) ×2 IMPLANT
KIT SUCTION CATH 14FR (SUCTIONS) IMPLANT
KIT TURNOVER KIT B (KITS) ×2 IMPLANT
NS IRRIG 1000ML POUR BTL (IV SOLUTION) ×2 IMPLANT
PACK GENERAL/GYN (CUSTOM PROCEDURE TRAY) ×2 IMPLANT
PAD ARMBOARD 7.5X6 YLW CONV (MISCELLANEOUS) ×4 IMPLANT
POWDER MYRIAD MORCELLS 500MG (Miscellaneous) ×1 IMPLANT
SET HNDPC FAN SPRY TIP SCT (DISPOSABLE) ×1 IMPLANT
SPONGE T-LAP 18X18 ~~LOC~~+RFID (SPONGE) ×8 IMPLANT
SPONGE T-LAP 4X18 ~~LOC~~+RFID (SPONGE) ×4 IMPLANT
STAPLER VISISTAT 35W (STAPLE) IMPLANT
SUT ETHILON 3 0 FSL (SUTURE) IMPLANT
SUT SILK 2 0SH CR/8 30 (SUTURE) ×1 IMPLANT
SUT VIC AB 1 CTX 36 (SUTURE)
SUT VIC AB 1 CTX36XBRD ANBCTR (SUTURE) IMPLANT
SUT VIC AB 2-0 CTX 27 (SUTURE) IMPLANT
SUT VIC AB 3-0 SH 27 (SUTURE) ×2
SUT VIC AB 3-0 SH 27X BRD (SUTURE) IMPLANT
SUT VIC AB 3-0 X1 27 (SUTURE) IMPLANT
SWAB COLLECTION DEVICE MRSA (MISCELLANEOUS) IMPLANT
SWAB CULTURE ESWAB REG 1ML (MISCELLANEOUS) IMPLANT
TOWEL GREEN STERILE (TOWEL DISPOSABLE) ×2 IMPLANT
TOWEL GREEN STERILE FF (TOWEL DISPOSABLE) ×2 IMPLANT
TRAY FOLEY MTR SLVR 16FR STAT (SET/KITS/TRAYS/PACK) IMPLANT
WATER STERILE IRR 1000ML POUR (IV SOLUTION) ×2 IMPLANT

## 2022-06-20 NOTE — Progress Notes (Signed)
ANTICOAGULATION CONSULT NOTE  Pharmacy Consult for heparin + warfarin Indication:  LVAD  No Known Allergies  Patient Measurements: Height: 5\' 10"  (177.8 cm) Weight: 82.9 kg (182 lb 12.2 oz) IBW/kg (Calculated) : 73 Heparin Dosing Weight: 80kg  Vital Signs: Temp: 97.6 F (36.4 C) (07/13 1100) Temp Source: Oral (07/13 1100) BP: 101/70 (07/13 1100) Pulse Rate: 64 (07/13 1100)  Labs: Recent Labs    06/18/22 0330 06/19/22 0340 06/20/22 0510  HGB 8.3* 8.2* 8.3*  HCT 26.0* 26.2* 26.2*  PLT 299 297 283  LABPROT 14.0 13.9 15.1  INR 1.1 1.1 1.2  HEPARINUNFRC <0.10* <0.10* <0.10*  CREATININE 1.47* 1.58* 1.48*  CKTOTAL 125  --   --      Estimated Creatinine Clearance: 54.1 mL/min (A) (by C-G formula based on SCr of 1.48 mg/dL (H)).   Medical History: Past Medical History:  Diagnosis Date   "    Arthritis    CAD (coronary artery disease)    a. s/p CABG in 11/2019 with LIMA-LAD, SVG-OM1, SVG-PDA and SVG-D1   CHF (congestive heart failure) (HCC)    a. EF < 20% by echo in 11/2019   Essential hypertension    PAF (paroxysmal atrial fibrillation) (HCC)    Type 2 diabetes mellitus (HCC)     Assessment: 76 yoM with HM3 LVAD admitted with ongoing LVAD driveline infection. Pt with planned debridements in OR. Pt is on warfarin PTA with last dose taken 7/3.   Pt s/p OR for infected wound debridement 7/7 and 7/13. Heparin to resume 4h postop. Per Dr. 8/13 - ok to continue warfarin with INR < / = 2.2 for procedures. CBC and LDH stable. INR 1.2 today.  *Home warfarin dose 6mg  MWF, 4mg  AODs  Goal of Therapy:  INR goal 2-2.5 Heparin level < 0.3 units/ml Monitor platelets by anticoagulation protocol: Yes   Plan:  -Warfarin 6mg  PO x1 tonight -Continue heparin at 500 units/hr - resume at 1400 no bolus -Daily INR, heparin level, CBC, LDH   Donata Clay, PharmD, [place], [facility] Clinical Pharmacist [phone] Please check AMION for all [facility] numbers 06/20/2022

## 2022-06-20 NOTE — Plan of Care (Signed)
  Problem: Education: Goal: Knowledge of General Education information will improve Description: Including pain rating scale, medication(s)/side effects and non-pharmacologic comfort measures Outcome: Progressing   Problem: Health Behavior/Discharge Planning: Goal: Ability to manage health-related needs will improve Outcome: Progressing   Problem: Clinical Measurements: Goal: Ability to maintain clinical measurements within normal limits will improve Outcome: Progressing Goal: Will remain free from infection Outcome: Progressing Goal: Diagnostic test results will improve Outcome: Progressing Goal: Respiratory complications will improve Outcome: Progressing Goal: Cardiovascular complication will be avoided Outcome: Progressing   Problem: Activity: Goal: Risk for activity intolerance will decrease Outcome: Progressing   Problem: Nutrition: Goal: Adequate nutrition will be maintained Outcome: Progressing   Problem: Coping: Goal: Level of anxiety will decrease Outcome: Progressing   Problem: Elimination: Goal: Will not experience complications related to bowel motility Outcome: Progressing Goal: Will not experience complications related to urinary retention Outcome: Progressing   Problem: Pain Managment: Goal: General experience of comfort will improve Outcome: Progressing   Problem: Safety: Goal: Ability to remain free from injury will improve Outcome: Progressing   Problem: Skin Integrity: Goal: Risk for impaired skin integrity will decrease Outcome: Progressing   Problem: Education: Goal: Ability to describe self-care measures that may prevent or decrease complications (Diabetes Survival Skills Education) will improve Outcome: Progressing Goal: Individualized Educational Video(s) Outcome: Progressing   Problem: Coping: Goal: Ability to adjust to condition or change in health will improve Outcome: Progressing   Problem: Fluid Volume: Goal: Ability to  maintain a balanced intake and output will improve Outcome: Progressing   Problem: Health Behavior/Discharge Planning: Goal: Ability to identify and utilize available resources and services will improve Outcome: Progressing Goal: Ability to manage health-related needs will improve Outcome: Progressing   Problem: Metabolic: Goal: Ability to maintain appropriate glucose levels will improve Outcome: Progressing   Problem: Nutritional: Goal: Maintenance of adequate nutrition will improve Outcome: Progressing Goal: Progress toward achieving an optimal weight will improve Outcome: Progressing   Problem: Skin Integrity: Goal: Risk for impaired skin integrity will decrease Outcome: Progressing   Problem: Tissue Perfusion: Goal: Adequacy of tissue perfusion will improve Outcome: Progressing   Problem: Education: Goal: Ability to demonstrate management of disease process will improve Outcome: Progressing Goal: Ability to verbalize understanding of medication therapies will improve Outcome: Progressing Goal: Individualized Educational Video(s) Outcome: Progressing   Problem: Activity: Goal: Capacity to carry out activities will improve Outcome: Progressing   Problem: Cardiac: Goal: Ability to achieve and maintain adequate cardiopulmonary perfusion will improve Outcome: Progressing   Problem: Education: Goal: Patient will understand all VAD equipment and how it functions Outcome: Progressing Goal: Patient will be able to verbalize current INR target range and antiplatelet therapy for discharge home Outcome: Progressing   Problem: Cardiac: Goal: LVAD will function as expected and patient will experience no clinical alarms Outcome: Progressing

## 2022-06-20 NOTE — Progress Notes (Signed)
VAD Coordinator Procedure Note:   VAD Coordinator met patient in holding. Pt undergoing VAD drive line debridement per Dr. Prescott Gum. Hemodynamics and VAD parameters monitored by myself and anesthesia throughout the procedure. Blood pressures were obtained with automatic cuff on left arm and correlated with Doppler modified systolic.    Time: Doppler Auto  BP Flow PI Power Speed  Pre-procedure:           07:45 120 120/90 (101 4.3 2.9 4.1 5600           Sedation Induction:          08:30  122/98 (107) 3.8 4.9 4.1    08:35  --- 3.2 10.2 4.3    08:40 110 116/83 (95) 3.1 9.6 4.0    08:45  104/80 (87) 3.7 5.1 4.0    09:00  85/68 (75) 3.8 3.1 3.9    09:15  97/76 (85) 3.4 3.9 3.9    09:30  83/67 (74) 3.3 3.8 3.8    09:45  95/73 (82) 3.3 3.8 3.8            Recovery Area:          10:05  104/93 (97) 3.5 4.4 3.9    10:15  103/82 (90) 3.7 3.5 3.9    10:30  103/76 (86) 3.8 3.5 4.0              Patient tolerated the procedure well. VAD Coordinator accompanied and remained with patient in recovery area.   Dr. Lawson Fiscal wife and patient (in PACU). Plan to hold heparin x 4 hours, return next Thursday for wound vac change in OR. VAD team updated.    Patient Disposition: [place] RN, VAD Coordinator 24/7 VAD pager: [phone]

## 2022-06-20 NOTE — Anesthesia Postprocedure Evaluation (Signed)
Anesthesia Post Note  Patient: Kevin Underwood  Procedure(s) Performed: VAD POWERCORD TUNNEL WOUND VAC CHANGE (Abdomen)     Patient location during evaluation: PACU Anesthesia Type: General Level of consciousness: awake and alert Pain management: pain level controlled Vital Signs Assessment: post-procedure vital signs reviewed and stable Respiratory status: spontaneous breathing, nonlabored ventilation, respiratory function stable and patient connected to nasal cannula oxygen Cardiovascular status: blood pressure returned to baseline and stable Postop Assessment: no apparent nausea or vomiting Anesthetic complications: no   No notable events documented.  Last Vitals:  Vitals:   06/20/22 1030 06/20/22 1042  BP: 103/76 96/65  Pulse: 70 63  Resp: 15 14  Temp:    SpO2: 98% 100%    Last Pain:  Vitals:   06/20/22 1030  TempSrc:   PainSc: 6                  Charlena Haub,W. EDMOND

## 2022-06-20 NOTE — Progress Notes (Signed)
PHARMACY CONSULT NOTE FOR:  OUTPATIENT  PARENTERAL ANTIBIOTIC THERAPY (OPAT)  Indication: VAD Driveline Infection - MRSA  Regimen: Vancomycin 1000 mg every 24 hours  End date: 08/01/22   IV antibiotic discharge orders are pended. To discharging provider:  please sign these orders via discharge navigator,  Select New Orders & click on the button choice - Manage This Unsigned Work.     Thank you for allowing pharmacy to be a part of this patient's care.  Sharin Mons, PharmD, BCPS, BCIDP Infectious Diseases Clinical Pharmacist Phone: [phone] 06/20/2022, 11:19 AM

## 2022-06-20 NOTE — Brief Op Note (Signed)
06/20/2022  9:56 AM  PATIENT:  Kevin Underwood  61 y.o. male  PRE-OPERATIVE DIAGNOSIS:  VAD HM3 POWERCORD TUNNEL INFECTION  POST-OPERATIVE DIAGNOSIS:  VAD HM3 POWERCORD TUNNEL INFECTION  PROCEDURE:  Procedure(s): VAD POWERCORD TUNNEL WOUND VAC CHANGE (N/A) SHARP EXCISIONAL DEBRIDEMENT OF FASCIA AND RECTUS MUSCLE IN  VAD CORD TUNNEL  SURGEON:  Surgeon(s) and Role:    Lovett Sox, MD - Primary  PHYSICIAN ASSISTANT:   ASSISTANTS: none   ANESTHESIA:   general  EBL:  25 mL   BLOOD ADMINISTERED:none  DRAINS: none   LOCAL MEDICATIONS USED:  NONE  SPECIMEN:  No Specimen  DISPOSITION OF SPECIMEN:  N/A  COUNTS:  YES  TOURNIQUET:  * No tourniquets in log *  DICTATION: .Dragon Dictation  PLAN OF CARE:  return to [facility] room  PATIENT DISPOSITION:  PACU - hemodynamically stable.   Delay start of Pharmacological VTE agent (>24hrs) due to surgical blood loss or risk of bleeding: yes Resume heparin in 4 hours, continue with po coumadin dosing

## 2022-06-20 NOTE — Transfer of Care (Signed)
Immediate Anesthesia Transfer of Care Note  Patient: Kevin Underwood  Procedure(s) Performed: VAD POWERCORD TUNNEL WOUND VAC CHANGE (Abdomen)  Patient Location: PACU  Anesthesia Type:General  Level of Consciousness: awake, alert  and oriented  Airway & Oxygen Therapy: Patient Spontanous Breathing  Post-op Assessment: Report given to RN, Post -op Vital signs reviewed and stable and Patient moving all extremities X 4  Post vital signs: Reviewed and stable  Last Vitals:  Vitals Value Taken Time  BP 104/93 06/20/22 1007  Temp 36.2 C 06/20/22 1007  Pulse 63 06/20/22 1011  Resp 11 06/20/22 1011  SpO2 100 % 06/20/22 1011  Vitals shown include unvalidated device data.  Last Pain:  Vitals:   06/20/22 1007  TempSrc:   PainSc: 6       Patients Stated Pain Goal: 0 (06/15/22 0304)  Complications: No notable events documented.

## 2022-06-20 NOTE — Anesthesia Procedure Notes (Signed)
Procedure Name: Intubation Date/Time: 06/20/2022 8:35 AM  Performed by: Nils Pyle, CRNAPre-anesthesia Checklist: Patient identified, Emergency Drugs available, Suction available and Patient being monitored Patient Re-evaluated:Patient Re-evaluated prior to induction Oxygen Delivery Method: Circle System Utilized Preoxygenation: Pre-oxygenation with 100% oxygen Induction Type: IV induction Ventilation: Mask ventilation without difficulty and Oral airway inserted - appropriate to patient size Laryngoscope Size: Hyacinth Meeker and 2 Grade View: Grade I Tube type: Oral Tube size: 7.5 mm Number of attempts: 1 Airway Equipment and Method: Stylet and Oral airway Placement Confirmation: ETT inserted through vocal cords under direct vision, positive ETCO2 and breath sounds checked- equal and bilateral Secured at: 23 cm Tube secured with: Tape Dental Injury: Teeth and Oropharynx as per pre-operative assessment

## 2022-06-20 NOTE — Progress Notes (Signed)
Pre Procedure note for inpatients:   DEZI SCHANER has been scheduled for Procedure(s): VAD POWERCORD TUNNEL WOUND VAC CHANGE (N/A) today. The various methods of treatment have been discussed with the patient. After consideration of the risks, benefits and treatment options the patient has consented to the planned procedure.   The patient has been seen and labs reviewed. There are no changes in the patient's condition to prevent proceeding with the planned procedure today.  Recent labs:  Lab Results  Component Value Date   WBC 10.2 06/20/2022   HGB 8.3 (L) 06/20/2022   HCT 26.2 (L) 06/20/2022   PLT 283 06/20/2022   GLUCOSE 113 (H) 06/20/2022   CHOL 142 09/11/2021   TRIG 95 09/11/2021   HDL 62 09/11/2021   LDLCALC 61 09/11/2021   ALT 86 (H) 06/12/2022   AST 68 (H) 06/12/2022   NA 138 06/20/2022   K 4.2 06/20/2022   CL 104 06/20/2022   CREATININE 1.48 (H) 06/20/2022   BUN 29 (H) 06/20/2022   CO2 26 06/20/2022   TSH 7.790 (H) 05/14/2022   INR 1.2 06/20/2022   HGBA1C 8.2 (H) 05/13/2022    Lovett Sox, MD 06/20/2022 8:13 AM

## 2022-06-20 NOTE — Progress Notes (Addendum)
Patient ID: Kevin Underwood, male   DOB: [birth year], 61 y.o.   MRN: [id number]   Advanced Heart Failure VAD Team Note  PCP-Cardiologist: Nona Dell, MD   Subjective:    S/p driveline site debridement and wound vac on 7/7.  7/10 Had Ramp ECHO. Speed optimized. 7/13 OR to [facility] dressing change.     INR 1.2  Crestor and daptomycin stopped w/ elevated CK level, no muscle pain.  CK 1000 => 757 => 598=>125.   Back on stain. Now on vancomycin.    SCr stable, 1.48   Complaining of some pain at dressing site.    LVAD INTERROGATION:  HeartMate III LVAD:   Flow 3.2 liters/min, speed 5600, power 4, PI 4.8. ~ 30 PI events.    Objective:    Vital Signs:   Temp:  [97.2 F (36.2 C)-98.4 F (36.9 C)] 97.6 F (36.4 C) (07/13 1100) Pulse Rate:  [63-88] 64 (07/13 1100) Resp:  [12-20] 15 (07/13 1100) BP: (96-127)/(65-104) 101/70 (07/13 1100) SpO2:  [94 %-100 %] 100 % (07/13 1100) Weight:  [82.9 kg] 82.9 kg (07/13 0400) Last BM Date : 06/19/22 Mean arterial Pressure  80-90s  Intake/Output:   Intake/Output Summary (Last 24 hours) at 06/20/2022 1112 Last data filed at 06/20/2022 0952 Gross per 24 hour  Intake 1743.81 ml  Output 1625 ml  Net 118.81 ml     Physical Exam   Physical Exam: GENERAL: No acute distress. HEENT: normal  NECK: Supple, JVP 5-6  .  2+ bilaterally, no bruits.  No lymphadenopathy or thyromegaly appreciated.   CARDIAC:  Mechanical heart sounds with LVAD hum present.  LUNGS:  Clear to auscultation bilaterally.  ABDOMEN:  Soft, round, nontender, positive bowel sounds x4.     LVAD exit site: VAC dressing.  Dressing dry and intact. Stabilization device present and accurately applied.  Driveline dressing is being changed daily per sterile technique. EXTREMITIES:  Warm and dry, no cyanosis, clubbing, rash or edema . RUE PICC  NEUROLOGIC:  Alert and oriented x 3.    No aphasia.  No dysarthria.  Affect pleasant.       Telemetry   SR 70-80s personally checked.    Labs   Basic Metabolic Panel: Recent Labs  Lab 06/16/22 0349 06/17/22 0400 06/18/22 0330 06/19/22 0340 06/20/22 0510  NA 135 136 138 137 138  K 4.5 4.3 4.5 4.3 4.2  CL 100 100 101 101 104  CO2 25 26 26 26 26   GLUCOSE 237* 215* 162* 116* 113*  BUN 29* 27* 28* 32* 29*  CREATININE 1.77* 1.65* 1.47* 1.58* 1.48*  CALCIUM 8.8* 8.8* 8.9 8.7* 8.7*    Liver Function Tests: No results for input(s): "AST", "ALT", "ALKPHOS", "BILITOT", "PROT", "ALBUMIN" in the last 168 hours.  No results for input(s): "LIPASE", "AMYLASE" in the last 168 hours. No results for input(s): "AMMONIA" in the last 168 hours.  CBC: Recent Labs  Lab 06/16/22 0349 06/17/22 0400 06/18/22 0330 06/19/22 0340 06/20/22 0510  WBC 10.1 9.0 9.9 10.2 10.2  HGB 8.0* 8.2* 8.3* 8.2* 8.3*  HCT 25.7* 26.0* 26.0* 26.2* 26.2*  MCV 84.3 84.4 83.6 84.2 85.1  PLT 294 291 299 297 283    INR: Recent Labs  Lab 06/16/22 0349 06/17/22 0400 06/18/22 0330 06/19/22 0340 06/20/22 0510  INR 1.1 1.1 1.1 1.1 1.2    Other results: EKG:    Imaging   No results found.   Medications:     Scheduled Medications:  acetaminophen  1,000 mg Oral  Once   allopurinol  100 mg Oral Daily   Chlorhexidine Gluconate Cloth  6 each Topical Daily   docusate sodium  200 mg Oral BID   fentaNYL       gabapentin  600 mg Oral BID   guaiFENesin  600 mg Oral BID   hydrALAZINE  25 mg Oral TID   insulin aspart  0-15 Units Subcutaneous TID WC   insulin aspart  0-5 Units Subcutaneous QHS   insulin detemir  15 Units Subcutaneous QHS   lactose free nutrition  237 mL Oral TID WC   loratadine  10 mg Oral Daily   methimazole  20 mg Oral BID   metoprolol succinate  25 mg Oral Daily   pantoprazole  40 mg Oral Daily   rosuvastatin  20 mg Oral Daily   sertraline  50 mg Oral Daily   tamsulosin  0.4 mg Oral Daily   traZODone  150 mg Oral QHS   warfarin  6 mg Oral ONCE-1600   Warfarin - Pharmacist Dosing Inpatient   Does not apply q1600     Infusions:  heparin     vancomycin Stopped (06/19/22 2238)    PRN Medications: acetaminophen, albuterol, fentaNYL, morphine injection, ondansetron (ZOFRAN) IV, mouth rinse, traMADol   Patient Profile   61 y.o. male with history of HM III LVAD, chronic systolic CHF/ICM, COPD, PAF. 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   Readmitted for driveline infection and surgical debridement/possible wound vac placement.  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 S/p driveline site debridement 6/8. Surgical wound cultures grew MRSA, repeat debridement 6/23. Driveline debridement with wound vac placement on 7/7.  - Transitioned from daptomycin to vancomycin due to elevated CK.  CK trending down, so far no problems with vancomycin. Vancomycin planned x 6 wks.  - Back to OR today for wound vac change.  - Plan to return to OR next Thursday.   - Restart  heparin drip and warfarin. INR 1.2 2.  Chronic systolic CHF: Echo 10/21 with EF 20-25%, mildly decreased RV function. LHC/RHC in 12/21 with patent grafts, low output. Suspect mixed ischemic/nonischemic cardiomyopathy (prior heavy ETOH and drugs as well as CAD).  No ETOH, drugs, smoking since CABG in 12/20. Admitted with cardiogenic shock in 12/21, had placement of Impella 5.5 initially, now s/p Heartmate 3 LVAD on 11/17/20.  Ramp echo 2/23 with speed decreased to 5500 rpm. He would eventually like heart transplant. NYHA class II. Have been allowing MAP to run higher due to recent AKI thought to be due to lowering BP too much in setting of right renal artery stenosis.  - Had Ramp ECHO with speed optimized.  MAP 90-100s. INR 1.2 today on warfarin.  Restarting low dose heparin.  LDH stable 189 - Volume status stable.  - Continue hydralazine 25 mg TID - Continue Toprol XL 25 mg daily.  - Volume status stable.  - Continue warfarin/low dose heparin.  3. AKI: Creatinine up to  4.6 last admission, initially thought this was ATN from low BP but he was never markedly hypotensive and LFTs without any evidence of shock liver. ? Vanc toxicity (troughs were ok). Taken off Entresto and hydralazine. Volume repleted with IVF. In reviewing prior CTA abdomen/pelvis, he has high-grade stenosis of proximal right renal artery. D/w Renal and feel like he may need higher perfusion pressures. Creatinine down to 1.48 - Would consider reassessment of renal artery stenosis and possible treatment once  creatinine has improved to baseline.  - Watch MAPs, avoid hypotension 4.  CAD: S/p CABG 12/20.  LHC pre-VAD with patent grafts, no target for intervention. - Stopped statin with elevated CK => this was in setting of daptomycin. -CK down to 125 Continue statin. Check CK in am.  5.Atrial fibrillation: Paroxysmal.  S/p Maze and LA appendage clip with CABG in 12/20. Maintaining NSR.  - Continue Toprol XL 25 mg daily.  - Off amiodarone with hyperthyroidism. - Warfarin . Restart heparin tonight.  6.  Type 2 diabetes: Takes glipizide at home.  - SSI  - Continue Levemir 15 units qHS - Diabetes coordinator following 7. 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  8. 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  He missed his endocrinology appt. Has been rescheduled for July 27th. - He is now off amiodarone.   - Continue methimazole 20 mg bid, TSH was mildly elevated.   9.  Gout: On allopurinol.  10. Anemia: Hgb 8.3 today.  - Transfuse hgb < 7.    Reengage HF CM/SW to coordinate HH, IV antibiotics and wound vac. Plans to return to the OR next Thursday.    I reviewed the LVAD parameters from today, and compared the results to the patient's prior recorded data.  No programming changes were made.  The LVAD is functioning within specified parameters.  The patient performs LVAD self-test daily.  LVAD interrogation was negative for any significant power changes, alarms  or PI events/speed drops.  LVAD equipment check completed and is in good working order.  Back-up equipment present.   LVAD education done on emergency procedures and precautions and reviewed exit site care.  Length of Stay: [place], NP 06/20/2022, 11:12 AM  VAD Team --- VAD ISSUES ONLY--- Pager [phone] (7am - 7am)  Advanced Heart Failure Team  Pager [phone] (M-F; 7a - 5p)  Please contact [facility] for night-coverage after hours (5p -7a ) and weekends on [email or web address]  Agree with the above NP note.   Patient had wound vac change in OR today.  Stable MAP and creatinine.  LVAD parameters stable.    General: Well appearing this am. NAD.  HEENT: Normal. Neck: Supple, JVP 7-8 cm. Carotids OK.  Cardiac:  Mechanical heart sounds with LVAD hum present.  Lungs:  CTAB, normal effort.  Abdomen:  NT, ND, no HSM. No bruits or masses. +BS  LVAD exit site: Wound vac Extremities:  Warm and dry. No cyanosis, clubbing, rash, or edema.  Neuro:  Alert & oriented x 3. Cranial nerves grossly intact. Moves all 4 extremities w/o difficulty. Affect pleasant    Restart warfarin/heparin with INR 1.2. Plan now is to return to OR next Thursday for debridement/vac change.   Loralie Champagne 06/20/2022

## 2022-06-20 NOTE — Op Note (Signed)
NAME: Kevin Underwood, Kevin Underwood MEDICAL RECORD NO: [id number] ACCOUNT NO: [id number] DATE OF BIRTH: 1961-06-16 FACILITY: [facility] LOCATION: [facility] PHYSICIAN: Ivin Poot III, MD  Operative Report   DATE OF PROCEDURE: 06/20/2022  OPERATION: 1.  Excisional debridement of abdominal wound, ventricular assist device power cord tunnel wound. 2.  Pulse lavage irrigation of abdominal wound with vancomycin irrigation. 3.  Placement of Myriad tissue matrix sheet. 4.  Placement of wound VAC, wound healing system.  PREOPERATIVE DIAGNOSIS:  Status post HeartMate 3 ventricular assist device implantation in 2022 with subsequent recent development of ventricular assist device power cord tunnel infection with methicillin resistant Staphylococcus aureus  POSTOPERATIVE DIAGNOSIS:  Status post HeartMate 3 ventricular assist device implantation in 2022 with subsequent recent development of ventricular assist device power cord tunnel infection with methicillin resistant Staphylococcus aureus.  SURGEON:  Len Childs, MD  ANESTHESIA:  General.  DESCRIPTION OF PROCEDURE:  The patient was brought from preoperative holding where informed consent was documented and final issues regarding the procedure were discussed with the patient.  The patient was placed supine on the operating table.  For the  entire procedure and recovery in the recovery room, the patient was attended to by the VAD coordinator who assisted with the control of the VAD equipment and to help with hemodynamic management of the patient with anesthesia and the nursing staff.  The patient was induced with general anesthesia and he remained stable.  The previous wound VAC sponge was removed.  The wound was then prepped and draped as a sterile field.  A proper timeout was performed.  The wound was inspected.  It was 8 cm long x 3 cm wide x 1.5 cm deep.  70% of the wound had some beginnings of granulation tissue.  There was no necrotic tissue.  Following  the driveline power cord, medially, there was a space of approximately 6 mm.   There was some milky fluid that was expressed from the space.  The space was opened by excisional debridement of the surrounding indurated tissue up to the point where the power cord was incorporated into the abdominal wall.  I then irrigated the entire  wound with 1 liter of vancomycin irrigation.  Hemostasis was then achieved.  I then applied Myriad tissue matrix powder to the most medial aspect of the wound underneath the power cord and then applied a Myriad tissue matrix sheath after it was cut to  the appropriate size and configuration and tacked this to the remainder of the wound going laterally with sutures of 3-0 Vicryl. I then placed  over the Myriad sheet  a Sorbact sheet, which was cut around the power cord and tacked to the skin with 3-0  Vicryl sutures. On top of the Sorbact, some sterile lubricant was applied and then the wound VAC was cut to the appropriate configuration on the power cord and covered with the wound VAC sheets and suction cord.  The system was connected to suction and  there was good compression of the sponge without leak.  The patient was then reversed from anesthesia and returned to the recovery room where the patient was assisted by the VAD coordinator in management of the VAD equipment and to monitor hemodynamics.   SHW D: 06/20/2022 10:08:36 am T: 06/20/2022 11:20:00 am  JOB: [id number]/ [id number]

## 2022-06-20 NOTE — Progress Notes (Signed)
LVAD Coordinator Rounding Note:  Admitted 06/12/22 to Dr. Alford Highland service due to drive line debridement scheduled with Dr Donata Clay 06/14/22.   Pt scheduled for drive line wound debridement today per Dr. Donata Clay - see separate note.   Vital signs: Temp:  97.6 HR: 64 Doppler Pressure: 120 Automatic BP: 120/90 (101) O2 Sat: 94% on RA Wt: 171.7>174.6>178.6>182.9>181>182.3>182.7 lbs   LVAD interrogation reveals:  Speed: 5600 Flow:  3.7 Power:  3.9w PI:  3.6 Alarms: none Events: > 60 PI events on 06/19/22 Hct: 27  Fixed speed:  5600 Low speed limit:  5300  Drive Line:  Wound vac in place with 125 suction; no leaks noted; draining serosanguinous fluid. Anchor intact to drive line. Planned wound vac change Thursday, 06/20/22 per Dr. Donata Clay.   Labs:  LDH trend: 249>244>208>22>229>199>210>189  INR trend: 1.3 >1.3>1.2>1.1>1.1>1.1>1.1>1.2  Anticoagulation Plan: -INR Goal: 2.0 - 2.5 -ASA Dose: none  ICD: N/A  Drips:  Heparin - 500 units/hr  Infection:  06/07/22>> repeat drive line culture>> abundant MRSA; final  Plan/Recommendations:  Page VAD coordinators for equipment or drive line issues Plan for drive line debridement in OR per Dr Donata Clay Thursday, 06/20/22. VAD coordinator will accompany pt.   Hessie Diener RN VAD Coordinator  Office: [phone]  24/7 Pager: [phone]

## 2022-06-21 DIAGNOSIS — Z95811 Presence of heart assist device: Secondary | ICD-10-CM | POA: Diagnosis not present

## 2022-06-21 DIAGNOSIS — T827XXA Infection and inflammatory reaction due to other cardiac and vascular devices, implants and grafts, initial encounter: Secondary | ICD-10-CM | POA: Diagnosis not present

## 2022-06-21 LAB — CK: Total CK: 59 U/L (ref 49–397)

## 2022-06-21 LAB — BASIC METABOLIC PANEL
Anion gap: 10 (ref 5–15)
BUN: 31 mg/dL — ABNORMAL HIGH (ref 8–23)
CO2: 24 mmol/L (ref 22–32)
Calcium: 8.6 mg/dL — ABNORMAL LOW (ref 8.9–10.3)
Chloride: 101 mmol/L (ref 98–111)
Creatinine, Ser: 1.33 mg/dL — ABNORMAL HIGH (ref 0.61–1.24)
GFR, Estimated: >60 mL/min (ref >60)
Glucose, Bld: 121 mg/dL — ABNORMAL HIGH (ref 70–99)
Potassium: 4.3 mmol/L (ref 3.5–5.1)
Sodium: 135 mmol/L (ref 135–145)

## 2022-06-21 LAB — GLUCOSE, CAPILLARY
Glucose-Capillary: 165 mg/dL — ABNORMAL HIGH (ref 70–99)
Glucose-Capillary: 147 mg/dL — ABNORMAL HIGH (ref 70–99)
Glucose-Capillary: 177 mg/dL — ABNORMAL HIGH (ref 70–99)
Glucose-Capillary: 203 mg/dL — ABNORMAL HIGH (ref 70–99)

## 2022-06-21 LAB — CBC
HCT: 23.6 % — ABNORMAL LOW (ref 39.0–52.0)
Hemoglobin: 7.5 g/dL — ABNORMAL LOW (ref 13.0–17.0)
MCH: 27 pg (ref 26.0–34.0)
MCHC: 31.8 g/dL (ref 30.0–36.0)
MCV: 84.9 fL (ref 80.0–100.0)
Platelets: 252 10*3/uL (ref 150–400)
RBC: 2.78 MIL/uL — ABNORMAL LOW (ref 4.22–5.81)
RDW: 17.9 % — ABNORMAL HIGH (ref 11.5–15.5)
WBC: 12.3 10*3/uL — ABNORMAL HIGH (ref 4.0–10.5)
nRBC: 0 % (ref 0.0–0.2)

## 2022-06-21 LAB — PROTIME-INR
INR: 1.3 — ABNORMAL HIGH (ref 0.8–1.2)
Prothrombin Time: 16.4 seconds — ABNORMAL HIGH (ref 11.4–15.2)

## 2022-06-21 LAB — PREPARE RBC (CROSSMATCH)

## 2022-06-21 LAB — LACTATE DEHYDROGENASE: LDH: 163 U/L (ref 98–192)

## 2022-06-21 LAB — HEMOGLOBIN AND HEMATOCRIT, BLOOD
HCT: 29.2 % — ABNORMAL LOW (ref 39.0–52.0)
Hemoglobin: 9.7 g/dL — ABNORMAL LOW (ref 13.0–17.0)

## 2022-06-21 LAB — HEPARIN LEVEL (UNFRACTIONATED): Heparin Unfractionated: 0.17 IU/mL — ABNORMAL LOW (ref 0.30–0.70)

## 2022-06-21 MED ORDER — WARFARIN SODIUM 3 MG PO TABS
6.0000 mg | ORAL_TABLET | Freq: Once | ORAL | Status: AC
  Administered 2022-06-21: 6 mg via ORAL
  Filled 2022-06-21: qty 2

## 2022-06-21 MED ORDER — SODIUM CHLORIDE 0.9% IV SOLUTION
Freq: Once | INTRAVENOUS | Status: AC
Start: 2022-06-21 — End: 2022-06-21

## 2022-06-21 MED ORDER — POLYETHYLENE GLYCOL 3350 17 G PO PACK
17.0000 g | PACK | Freq: Every day | ORAL | Status: DC
  Administered 2022-06-21 – 2022-07-18 (×23): 17 g via ORAL
  Filled 2022-06-21 (×27): qty 1

## 2022-06-21 NOTE — Progress Notes (Signed)
1 Day Post-Op Procedure(s) (LRB): VAD POWERCORD TUNNEL WOUND VAC CHANGE (N/A) Subjective: Patient feels well after debridement and wound VAC change yesterday.  Wound VAC seal is tight without alarm.  Minimal serosanguineous drainage over the first 24 hours. 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  Objective: Vital signs in last 24 hours: Temp:  [97.5 F (36.4 C)-97.8 F (36.6 C)] 97.7 F (36.5 C) (07/14 0942) Pulse Rate:  [69-90] 69 (07/14 0942) Cardiac Rhythm: Normal sinus rhythm (07/14 0800) Resp:  [13-20] 13 (07/14 0942) BP: (106-130)/(70-96) 126/91 (07/14 0942) SpO2:  [92 %-99 %] 97 % (07/14 0917) Weight:  [84.7 kg] 84.7 kg (07/14 0511)  Hemodynamic parameters for last 24 hours: Stable  Intake/Output from previous day: 07/13 0701 - 07/14 0700 In: 2453.2 [P.O.:960; I.V.:793.2; IV Piggyback:700] Out: 2300 [Urine:2225; Drains:50; Blood:25] Intake/Output this shift: Total I/O In: 24.8 [I.V.:24.8] Out: -        Exam    General- alert and comfortable.  Wound VAC sponge compressed and suction catheter is secure.    Neck- no JVD, no cervical adenopathy palpable, no carotid bruit   Lungs- clear without rales, wheezes   Cor- regular rate and rhythm, no murmur , gallop.  Normal VAD hum   Abdomen- soft, non-tender   Extremities - warm, non-tender, minimal edema   Neuro- oriented, appropriate, no focal weakness   Lab Results: Recent Labs    06/20/22 0510 06/21/22 0500  WBC 10.2 12.3*  HGB 8.3* 7.5*  HCT 26.2* 23.6*  PLT 283 252   BMET:  Recent Labs    06/20/22 0510 06/21/22 0500  NA 138 135  K 4.2 4.3  CL 104 101  CO2 26 24  GLUCOSE 113* 121*  BUN 29* 31*  CREATININE 1.48* 1.33*  CALCIUM 8.7* 8.6*    PT/INR:  Recent Labs    06/21/22 0500  LABPROT 16.4*  INR 1.3*   ABG    Component Value Date/Time   PHART 7.508 (H) 11/20/2020 0438   HCO3 24.8 09/09/2021 0313   TCO2 29 11/20/2020 0438   ACIDBASEDEF  6.0 (H) 11/24/2019 1718   O2SAT 63.4 09/09/2021 0313   CBG (last 3)  Recent Labs    06/20/22 1540 06/20/22 2135 06/21/22 0621  GLUCAP 345* 205* 165*    Assessment/Plan: S/P Procedure(s) (LRB): VAD POWERCORD TUNNEL WOUND VAC CHANGE (N/A) Continue wound VAC therapy for abdominal wall power cord tunnel infection.  Continue IV vancomycin.  Continue oral Coumadin load for INR goal 2.0-2.5 Transfuse 1 unit packed cells for hemoglobin 7.5 since patient has a large wound to heal and has chronic renal disease as well.  LOS: 9 days    Lovett Sox 06/21/2022

## 2022-06-21 NOTE — Progress Notes (Addendum)
Patient ID: Kevin Underwood, male   DOB: [birth year], 61 y.o.   MRN: [id number]   Advanced Heart Failure VAD Team Note  PCP-Cardiologist: Kevin Lesches, MD   Subjective:    S/p driveline site debridement and wound vac on 7/7.  7/10 Had Ramp ECHO. Speed optimized. 7/13 OR to [facility] dressing change.  Back on Heparin drip.    INR 1.3   Crestor and daptomycin stopped w/ elevated CK level, no muscle pain.  CK 1000 => 757 => 598=>125=>59 .   Back on stain. Now on vancomycin.    SCr stable, 1.33  No complaints. No bleeding issues  .    LVAD INTERROGATION:  HeartMate III LVAD:   Flow 4.1  liters/min, speed 5600, power 4, PI 3.3 . On batteries .    Objective:    Vital Signs:   Temp:  [97.2 F (36.2 C)-97.9 F (36.6 C)] 97.6 F (36.4 C) (07/14 0505) Pulse Rate:  [63-90] 69 (07/14 0505) Resp:  [12-19] 18 (07/14 0505) BP: (96-130)/(65-96) 106/90 (07/14 0505) SpO2:  [92 %-100 %] 99 % (07/14 0505) Weight:  [84.7 kg] 84.7 kg (07/14 0511) Last BM Date : 06/19/22 Mean arterial Pressure  80-90s  Intake/Output:   Intake/Output Summary (Last 24 hours) at 06/21/2022 0654 Last data filed at 06/21/2022 0506 Gross per 24 hour  Intake 2453.21 ml  Output 2275 ml  Net 178.21 ml     Physical Exam    Physical Exam: GENERAL: No acute distress. Sitting in the chair.  HEENT: normal  NECK: Supple, JVP  flat .  2+ bilaterally, no bruits.  No lymphadenopathy or thyromegaly appreciated.   CARDIAC:  Mechanical heart sounds with LVAD hum present.  LUNGS:  Clear to auscultation bilaterally.  ABDOMEN:  Soft, round, nontender, positive bowel sounds x4.     LVAD exit site: VAC dressing intact .  Dressing dry and intact.  No erythema or drainage.  Stabilization device present and accurately applied.  EXTREMITIES:  Warm and dry, no cyanosis, clubbing, rash or edema. RUE PICC   NEUROLOGIC:  Alert and oriented x 3.    No aphasia.  No dysarthria.  Affect pleasant.        Telemetry   SR 70-80s  personally checked.   Labs   Basic Metabolic Panel: Recent Labs  Lab 06/17/22 0400 06/18/22 0330 06/19/22 0340 06/20/22 0510 06/21/22 0500  NA 136 138 137 138 135  K 4.3 4.5 4.3 4.2 4.3  CL 100 101 101 104 101  CO2 26 26 26 26 24   GLUCOSE 215* 162* 116* 113* 121*  BUN 27* 28* 32* 29* 31*  CREATININE 1.65* 1.47* 1.58* 1.48* 1.33*  CALCIUM 8.8* 8.9 8.7* 8.7* 8.6*    Liver Function Tests: No results for input(s): "AST", "ALT", "ALKPHOS", "BILITOT", "PROT", "ALBUMIN" in the last 168 hours.  No results for input(s): "LIPASE", "AMYLASE" in the last 168 hours. No results for input(s): "AMMONIA" in the last 168 hours.  CBC: Recent Labs  Lab 06/17/22 0400 06/18/22 0330 06/19/22 0340 06/20/22 0510 06/21/22 0500  WBC 9.0 9.9 10.2 10.2 12.3*  HGB 8.2* 8.3* 8.2* 8.3* 7.5*  HCT 26.0* 26.0* 26.2* 26.2* 23.6*  MCV 84.4 83.6 84.2 85.1 84.9  PLT 291 299 297 283 252    INR: Recent Labs  Lab 06/17/22 0400 06/18/22 0330 06/19/22 0340 06/20/22 0510 06/21/22 0500  INR 1.1 1.1 1.1 1.2 1.3*    Other results: EKG:    Imaging   No results found.   Medications:  Scheduled Medications:  acetaminophen  1,000 mg Oral Once   allopurinol  100 mg Oral Daily   Chlorhexidine Gluconate Cloth  6 each Topical Daily   docusate sodium  200 mg Oral BID   gabapentin  600 mg Oral BID   guaiFENesin  600 mg Oral BID   hydrALAZINE  25 mg Oral TID   insulin aspart  0-15 Units Subcutaneous TID WC   insulin aspart  0-5 Units Subcutaneous QHS   insulin detemir  15 Units Subcutaneous QHS   lactose free nutrition  237 mL Oral TID WC   loratadine  10 mg Oral Daily   methimazole  20 mg Oral BID   metoprolol succinate  25 mg Oral Daily   pantoprazole  40 mg Oral Daily   rosuvastatin  20 mg Oral Daily   sertraline  50 mg Oral Daily   tamsulosin  0.4 mg Oral Daily   traZODone  150 mg Oral QHS   Warfarin - Pharmacist Dosing Inpatient   Does not apply q1600    Infusions:  heparin 500  Units/hr (06/21/22 0300)   vancomycin Stopped (06/20/22 2212)    PRN Medications: acetaminophen, albuterol, morphine injection, ondansetron (ZOFRAN) IV, mouth rinse, traMADol   Patient Profile   61 y.o. male with history of HM III LVAD, chronic systolic CHF/ICM, COPD, PAF. 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   Readmitted for driveline infection and surgical debridement/possible wound vac placement.  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 S/p driveline site debridement 6/8. Surgical wound cultures grew MRSA, repeat debridement 6/23. Driveline debridement with wound vac placement on 7/7.  - Transitioned from daptomycin to vancomycin due to elevated CK.  CK trending down, so far no problems with vancomycin. Vancomycin planned x 6 wks.  - 7/13 VAC dressing change.  - Plan to return to OR next Thursday.   - Restart  heparin drip and warfarin. INR 1.3 2.  Chronic systolic CHF: Echo 10/21 with EF 20-25%, mildly decreased RV function. LHC/RHC in 12/21 with patent grafts, low output. Suspect mixed ischemic/nonischemic cardiomyopathy (prior heavy ETOH and drugs as well as CAD).  No ETOH, drugs, smoking since CABG in 12/20. Admitted with cardiogenic shock in 12/21, had placement of Impella 5.5 initially, now s/p Heartmate 3 LVAD on 11/17/20.  Ramp echo 2/23 with speed decreased to 5500 rpm. He would eventually like heart transplant. NYHA class II. Have been allowing MAP to run higher due to recent AKI thought to be due to lowering BP too much in setting of right renal artery stenosis.  - Had Ramp ECHO with speed optimized.  MAP 90s .INR 1.3 today on warfarin.+ heparin drip.   Restarting low dose heparin.  LDH stable 163 - Volume status stable.  - Continue hydralazine 25 mg TID - Continue Toprol XL 25 mg daily.  - Volume status stable.  - Continue warfarin/low dose heparin.  3. AKI: Creatinine up to 4.6 last  admission, initially thought this was ATN from low BP but he was never markedly hypotensive and LFTs without any evidence of shock liver. ? Vanc toxicity (troughs were ok). Taken off Entresto and hydralazine. Volume repleted with IVF. In reviewing prior CTA abdomen/pelvis, he has high-grade stenosis of proximal right renal artery. D/w Renal and feel like he may need higher perfusion pressures. Creatinine down to 1.33 - Would consider reassessment of renal artery stenosis and possible treatment once creatinine has improved to baseline.  -  Watch MAPs, avoid hypotension 4.  CAD: S/p CABG 12/20.  LHC pre-VAD with patent grafts, no target for intervention. - Stopped statin with elevated CK => this was in setting of daptomycin.  -CK down to 125 , statin restarted.  CK today 59.  5.Atrial fibrillation: Paroxysmal.  S/p Maze and LA appendage clip with CABG in 12/20. IN SR.  - Continue Toprol XL 25 mg daily.  - Off amiodarone with hyperthyroidism. - On Warfarin + heparin.  6.  Type 2 diabetes: Takes glipizide at home.  - SSI  - Continue Levemir 15 units qHS - Diabetes coordinator following 7. 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  8. 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  He missed his endocrinology appt. Has been rescheduled for July 27th. - He is now off amiodarone.   - Continue methimazole 20 mg bid, TSH was mildly elevated.   9.  Gout: On allopurinol.  10. Anemia: Hgb 7.5  today. NO obvious source.  - Transfuse hgb < 7.    Reengage HF CM/SW to coordinate HH, IV antibiotics and wound vac.  Plans to return to the OR next Thursday.    I reviewed the LVAD parameters from today, and compared the results to the patient's prior recorded data.  No programming changes were made.  The LVAD is functioning within specified parameters.  The patient performs LVAD self-test daily.  LVAD interrogation was negative for any significant power changes, alarms or PI  events/speed drops.  LVAD equipment check completed and is in good working order.  Back-up equipment present.   LVAD education done on emergency procedures and precautions and reviewed exit site care.  Length of Stay: 9  Kevin Clegg, NP 06/21/2022, 6:54 AM  VAD Team --- VAD ISSUES ONLY--- Pager [phone] (7am - 7am)  Advanced Heart Failure Team  Pager [phone] (M-F; 7a - 5p)  Please contact [facility] for night-coverage after hours (5p -7a ) and weekends on [email or web address]  Patient seen with NP, agree with the above note.   Hgb has trended down with no overt bleeding, 7.5 today.  No complaints, LVAD parameters stable.   General: Well appearing this am. NAD.  HEENT: Normal. Neck: Supple, JVP 7-8 cm. Carotids OK.  Cardiac:  Mechanical heart sounds with LVAD hum present.  Lungs:  CTAB, normal effort.  Abdomen:  NT, ND, no HSM. No bruits or masses. +BS  LVAD exit site: Wound vac in place Extremities:  Warm and dry. No cyanosis, clubbing, rash, or edema.  Neuro:  Alert & oriented x 3. Cranial nerves grossly intact. Moves all 4 extremities w/o difficulty. Affect pleasant    Back to OR for wound vac change and debridement again again next Thursday. Tolerating vancomycin, will continue.    Renal function remaining stable.   Hgb 7.5, will give 1 unit PRBCs.  He is not overtly bleeding, this has slowly trended down.   Kevin Underwood 06/21/2022. 1:21 PM

## 2022-06-21 NOTE — Progress Notes (Addendum)
LVAD Coordinator Rounding Note:  Admitted 06/12/22 to Dr. Alford Highland service due to drive line debridement scheduled with Dr Donata Clay 06/14/22.   Pt sitting up in chair at bedside. Denies complaints this am, says he go morphine at bedtime and slept through night. Denies incisional pain this am. PCs infusing.   Dr. Thedore Mins (ID) in room to assess patient.   Vital signs: Temp:  97.7 HR: 69 Doppler Pressure:  92 Automatic BP:  122/84 (93) O2 Sat: 97% on RA Wt: 171.7>174.6>178.6>182.9>181>182.3>182.7>186.7 lbs   LVAD interrogation reveals:  Speed: 5600 Flow:  4.3 Power:  4.0w PI:  2.7 Alarms: on batteries Events:  on batteries Hct: 27  Fixed speed:  5600 Low speed limit:  5300  Drive Line:  Wound vac in place with 125 suction; no leaks noted; draining serosanguinous fluid. Anchor intact to drive line. Planned wound vac change Thursday, 06/27/22 per Dr. Donata Clay.   Labs:  LDH trend: 249>244>208>22>229>199>210>189>163  INR trend: 1.3 >1.3>1.2>1.1>1.1>1.1>1.1>1.2>1.3  Anticoagulation Plan: -INR Goal: 2.0 - 2.5 -ASA Dose: none  ICD: N/A  Drips:  Heparin - 500 units/hr  Infection:  06/07/22>> repeat drive line culture>> abundant MRSA; final   Blood Products: - 06/21/22>> 1 PRBC   Plan/Recommendations:  Page VAD coordinators for equipment or drive line issues Plan for drive line debridement in OR per Dr Donata Clay Thursday, 06/27/22. VAD coordinator will accompany pt.   Hessie Diener RN VAD Coordinator  Office: [phone]  24/7 Pager: [phone]
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
## 2022-06-21 NOTE — Progress Notes (Signed)
ANTICOAGULATION CONSULT NOTE  Pharmacy Consult for heparin + warfarin Indication:  LVAD  No Known Allergies  Patient Measurements: Height: 5\' 10"  (177.8 cm) Weight: 84.7 kg (186 lb 11.7 oz) IBW/kg (Calculated) : 73 Heparin Dosing Weight: 80kg  Vital Signs: Temp: 97.6 F (36.4 C) (07/14 0505) Temp Source: Oral (07/14 0505) BP: 106/90 (07/14 0505) Pulse Rate: 69 (07/14 0505)  Labs: Recent Labs    06/19/22 0340 06/20/22 0510 06/21/22 0500  HGB 8.2* 8.3* 7.5*  HCT 26.2* 26.2* 23.6*  PLT 297 283 252  LABPROT 13.9 15.1 16.4*  INR 1.1 1.2 1.3*  HEPARINUNFRC <0.10* <0.10* 0.17*  CREATININE 1.58* 1.48* 1.33*  CKTOTAL  --   --  59     Estimated Creatinine Clearance: 60.2 mL/min (A) (by C-G formula based on SCr of 1.33 mg/dL (H)).   Medical History: Past Medical History:  Diagnosis Date   "    Arthritis    CAD (coronary artery disease)    a. s/p CABG in 11/2019 with LIMA-LAD, SVG-OM1, SVG-PDA and SVG-D1   CHF (congestive heart failure) (HCC)    a. EF < 20% by echo in 11/2019   Essential hypertension    PAF (paroxysmal atrial fibrillation) (HCC)    Type 2 diabetes mellitus (HCC)     Assessment: 14 yoM with HM3 LVAD admitted with ongoing LVAD driveline infection. Pt with planned debridements in OR. Pt is on warfarin PTA with last dose taken 7/3.   Pt s/p OR for infected wound debridement 7/7 and 7/13. Heparin to resumed postop. Planning for repeat OR visit next week. Per Dr. 8/13 - ok to continue warfarin with INR < / = 2.2 for procedures. CBC and LDH stable. INR 1.3 today, heparin level 0.17, CBC down but no S/Sx bleeding and minimal drainage in vac.  *Home warfarin dose 6mg  MWF, 4mg  AODs  Goal of Therapy:  INR goal 2-2.5 Heparin level < 0.3 units/ml Monitor platelets by anticoagulation protocol: Yes   Plan:  -Warfarin 6mg  PO x1 tonight -Continue heparin at 500 units/h -Daily INR, heparin level, CBC, LDH   Donata Clay, PharmD, [place], [facility] Clinical  Pharmacist [phone] Please check AMION for all [facility] Pharmacy numbers 06/21/2022

## 2022-06-21 NOTE — Progress Notes (Signed)
Pharmacy Antibiotic Note  Kevin Underwood is a 61 y.o. male admitted on 06/12/2022 with LVAD Driveline infection s/p debridement  7/7 and 7/13. Noted patient was on Daptomycin with CK increased up to 1000, pt transitioned back to vancomycin.   Scr is stable and down to 1.33 today. CK trended back down to 59. Levels done 7/10-7/11  showed a therapeutic AUC on current vancomycin dose.    Plan: Continue vancomycin 1000mg  IV q24h Would plan for levels early next week- Mon/Tues Monitor debridements, renal function, levels    Height: 5\' 10"  (177.8 cm) Weight: 84.7 kg (186 lb 11.7 oz) IBW/kg (Calculated) : 73  Temp (24hrs), Avg:97.6 F (36.4 C), Min:97.2 F (36.2 C), Max:97.9 F (36.6 C)  Recent Labs  Lab 06/17/22 0400 06/17/22 2250 06/18/22 0330 06/18/22 1855 06/19/22 0340 06/20/22 0510 06/21/22 0500  WBC 9.0  --  9.9  --  10.2 10.2 12.3*  CREATININE 1.65*  --  1.47*  --  1.58* 1.48* 1.33*  VANCOTROUGH  --   --   --  13*  --   --   --   VANCOPEAK  --  34  --   --   --   --   --      Estimated Creatinine Clearance: 60.2 mL/min (A) (by C-G formula based on SCr of 1.33 mg/dL (H)).    No Known Allergies    Thank you for allowing pharmacy to be a part of this patient's care.  06/22/22, PharmD, BCPS, BCIDP Infectious Diseases Clinical Pharmacist Phone: [phone] 06/21/2022

## 2022-06-22 DIAGNOSIS — Z95811 Presence of heart assist device: Secondary | ICD-10-CM | POA: Diagnosis not present

## 2022-06-22 DIAGNOSIS — T827XXA Infection and inflammatory reaction due to other cardiac and vascular devices, implants and grafts, initial encounter: Secondary | ICD-10-CM | POA: Diagnosis not present

## 2022-06-22 LAB — CBC
HCT: 29.4 % — ABNORMAL LOW (ref 39.0–52.0)
Hemoglobin: 9.9 g/dL — ABNORMAL LOW (ref 13.0–17.0)
MCH: 28.2 pg (ref 26.0–34.0)
MCHC: 33.7 g/dL (ref 30.0–36.0)
MCV: 83.8 fL (ref 80.0–100.0)
Platelets: 236 10*3/uL (ref 150–400)
RBC: 3.51 MIL/uL — ABNORMAL LOW (ref 4.22–5.81)
RDW: 18.4 % — ABNORMAL HIGH (ref 11.5–15.5)
WBC: 12.2 10*3/uL — ABNORMAL HIGH (ref 4.0–10.5)
nRBC: 0 % (ref 0.0–0.2)

## 2022-06-22 LAB — BASIC METABOLIC PANEL
Anion gap: 9 (ref 5–15)
BUN: 34 mg/dL — ABNORMAL HIGH (ref 8–23)
CO2: 25 mmol/L (ref 22–32)
Calcium: 8.9 mg/dL (ref 8.9–10.3)
Chloride: 104 mmol/L (ref 98–111)
Creatinine, Ser: 1.41 mg/dL — ABNORMAL HIGH (ref 0.61–1.24)
GFR, Estimated: 57 mL/min — ABNORMAL LOW (ref >60)
Glucose, Bld: 63 mg/dL — ABNORMAL LOW (ref 70–99)
Potassium: 4 mmol/L (ref 3.5–5.1)
Sodium: 138 mmol/L (ref 135–145)

## 2022-06-22 LAB — TYPE AND SCREEN
ABO/RH(D): B POS
Antibody Screen: NEGATIVE
Unit division: 0
Unit division: 0

## 2022-06-22 LAB — BPAM RBC
Blood Product Expiration Date: 202308052359
Blood Product Expiration Date: 202308052359
ISSUE DATE / TIME: 202307140919
ISSUE DATE / TIME: 202307141421
Unit Type and Rh: 7300
Unit Type and Rh: 7300

## 2022-06-22 LAB — LACTATE DEHYDROGENASE: LDH: 164 U/L (ref 98–192)

## 2022-06-22 LAB — GLUCOSE, CAPILLARY
Glucose-Capillary: 157 mg/dL — ABNORMAL HIGH (ref 70–99)
Glucose-Capillary: 308 mg/dL — ABNORMAL HIGH (ref 70–99)
Glucose-Capillary: 145 mg/dL — ABNORMAL HIGH (ref 70–99)
Glucose-Capillary: 237 mg/dL — ABNORMAL HIGH (ref 70–99)

## 2022-06-22 LAB — HEPARIN LEVEL (UNFRACTIONATED): Heparin Unfractionated: <0.1 IU/mL — ABNORMAL LOW (ref 0.30–0.70)

## 2022-06-22 LAB — PROTIME-INR
INR: 1.3 — ABNORMAL HIGH (ref 0.8–1.2)
Prothrombin Time: 16 seconds — ABNORMAL HIGH (ref 11.4–15.2)

## 2022-06-22 MED ORDER — WARFARIN SODIUM 7.5 MG PO TABS
7.5000 mg | ORAL_TABLET | Freq: Once | ORAL | Status: AC
  Administered 2022-06-22: 7.5 mg via ORAL
  Filled 2022-06-22: qty 1

## 2022-06-22 NOTE — Progress Notes (Signed)
ANTICOAGULATION CONSULT NOTE  Pharmacy Consult for heparin + warfarin Indication:  LVAD  No Known Allergies  Patient Measurements: Height: 5\' 10"  (177.8 cm) Weight: 84.9 kg (187 lb 2.7 oz) IBW/kg (Calculated) : 73 Heparin Dosing Weight: 80kg  Vital Signs: Temp: 97.7 F (36.5 C) (07/15 0723) Temp Source: Oral (07/15 0723) BP: 112/82 (07/15 1226) Pulse Rate: 62 (07/15 0508)  Labs: Recent Labs    06/20/22 0510 06/21/22 0500 06/21/22 2014 06/22/22 0500  HGB 8.3* 7.5* 9.7* 9.9*  HCT 26.2* 23.6* 29.2* 29.4*  PLT 283 252  --  236  LABPROT 15.1 16.4*  --  16.0*  INR 1.2 1.3*  --  1.3*  HEPARINUNFRC <0.10* 0.17*  --  <0.10*  CREATININE 1.48* 1.33*  --  1.41*  CKTOTAL  --  59  --   --      Estimated Creatinine Clearance: 56.8 mL/min (A) (by C-G formula based on SCr of 1.41 mg/dL (H)).   Medical History: Past Medical History:  Diagnosis Date   "    Arthritis    CAD (coronary artery disease)    a. s/p CABG in 11/2019 with LIMA-LAD, SVG-OM1, SVG-PDA and SVG-D1   CHF (congestive heart failure) (HCC)    a. EF < 20% by echo in 11/2019   Essential hypertension    PAF (paroxysmal atrial fibrillation) (HCC)    Type 2 diabetes mellitus (HCC)     Assessment: 15 yoM with HM3 LVAD admitted with ongoing LVAD driveline infection. Pt with planned debridements in OR. Pt is on warfarin PTA with last dose taken 7/3.   Pt s/p OR for infected wound debridement 7/7 and 7/13. Heparin to resumed postop. Planning for repeat OR visit next week. Per Dr. 8/13 - ok to continue warfarin with INR < / = 2.2 for procedures. CBC and LDH stable. INR 1.4 today, heparin level undetectable, CBC down but no S/Sx bleeding and minimal drainage in vac.  *Home warfarin dose 6mg  MWF, 4mg  AODs  Goal of Therapy:  INR goal 2-2.5 Heparin level < 0.3 units/ml Monitor platelets by anticoagulation protocol: Yes   Plan:  -Warfarin 7.5 mg x 1   -Continue heparin at 500 units/h -Daily INR, heparin level,  CBC, LDH   Donata Clay, , [facility] Clinical Pharmacist  06/22/2022 2:54 PM   [facility] pharmacy phone numbers are listed on [email or web address]

## 2022-06-22 NOTE — Progress Notes (Signed)
Patient ID: Kevin Underwood, male   DOB: 07-01-1961, 61 y.o.   MRN: [id number]   Advanced Heart Failure VAD Team Note  PCP-Cardiologist: Nona Dell, MD   Subjective:    S/p driveline site debridement and wound vac on 7/7.  7/10 Had Ramp ECHO. Speed optimized. 7/13 OR to [facility] dressing change.  Back on Heparin drip.   Crestor and daptomycin stopped w/ elevated CK level, no muscle pain.  CK 1000 => 757 => 598=>125=>59 .   Back on statin. Now on vancomycin.    SCr stable, 1.33 -> 1.41  On heparin. No bleeding. Got 1u RBCs hgb 9.9 INR 1.3  No complaints. No bleeding issues    LVAD INTERROGATION:  HeartMate III LVAD:   Flow 4.3  liters/min, speed 5600, power 4.0, PI 3.3 .   Objective:    Vital Signs:   Temp:  [97.5 F (36.4 C)-98.4 F (36.9 C)] 97.6 F (36.4 C) (07/15 0508) Pulse Rate:  [62-75] 62 (07/15 0508) Resp:  [13-20] 16 (07/15 0508) BP: (92-132)/(70-100) 109/83 (07/15 0508) SpO2:  [92 %-99 %] 99 % (07/15 0508) Weight:  [84.9 kg] 84.9 kg (07/15 0508) Last BM Date : 06/20/22 Mean arterial Pressure  90s Intake/Output:   Intake/Output Summary (Last 24 hours) at 06/22/2022 0702 Last data filed at 06/22/2022 0509 Gross per 24 hour  Intake 2651.69 ml  Output 1625 ml  Net 1026.69 ml      Physical Exam   General:  NAD.  HEENT: normal  Neck: supple. JVP not elevated.  Carotids 2+ bilat; no bruits. No lymphadenopathy or thryomegaly appreciated. Cor: LVAD hum.  Lungs: Clear. Abdomen: obese soft, nontender, non-distended. No hepatosplenomegaly. No bruits or masses. Good bowel sounds. Wound vac in place with good suction  Extremities: no cyanosis, clubbing, rash. Warm no edema  Neuro: alert & oriented x 3. No focal deficits. Moves all 4 without problem    Telemetry   SR 760-70s Personally reviewed  Labs   Basic Metabolic Panel: Recent Labs  Lab 06/18/22 0330 06/19/22 0340 06/20/22 0510 06/21/22 0500 06/22/22 0500  NA 138 137 138 135 138  K 4.5 4.3 4.2  4.3 4.0  CL 101 101 104 101 104  CO2 26 26 26 24 25   GLUCOSE 162* 116* 113* 121* 63*  BUN 28* 32* 29* 31* 34*  CREATININE 1.47* 1.58* 1.48* 1.33* 1.41*  CALCIUM 8.9 8.7* 8.7* 8.6* 8.9     Liver Function Tests: No results for input(s): "AST", "ALT", "ALKPHOS", "BILITOT", "PROT", "ALBUMIN" in the last 168 hours.  No results for input(s): "LIPASE", "AMYLASE" in the last 168 hours. No results for input(s): "AMMONIA" in the last 168 hours.  CBC: Recent Labs  Lab 06/18/22 0330 06/19/22 0340 06/20/22 0510 06/21/22 0500 06/21/22 2014 06/22/22 0500  WBC 9.9 10.2 10.2 12.3*  --  12.2*  HGB 8.3* 8.2* 8.3* 7.5* 9.7* 9.9*  HCT 26.0* 26.2* 26.2* 23.6* 29.2* 29.4*  MCV 83.6 84.2 85.1 84.9  --  83.8  PLT 299 297 283 252  --  236     INR: Recent Labs  Lab 06/18/22 0330 06/19/22 0340 06/20/22 0510 06/21/22 0500 06/22/22 0500  INR 1.1 1.1 1.2 1.3* 1.3*     Other results: EKG:    Imaging   No results found.   Medications:     Scheduled Medications:  acetaminophen  1,000 mg Oral Once   allopurinol  100 mg Oral Daily   Chlorhexidine Gluconate Cloth  6 each Topical Daily   docusate  sodium  200 mg Oral BID   gabapentin  600 mg Oral BID   guaiFENesin  600 mg Oral BID   hydrALAZINE  25 mg Oral TID   insulin aspart  0-15 Units Subcutaneous TID WC   insulin aspart  0-5 Units Subcutaneous QHS   insulin detemir  15 Units Subcutaneous QHS   lactose free nutrition  237 mL Oral TID WC   loratadine  10 mg Oral Daily   methimazole  20 mg Oral BID   metoprolol succinate  25 mg Oral Daily   pantoprazole  40 mg Oral Daily   polyethylene glycol  17 g Oral Daily   rosuvastatin  20 mg Oral Daily   sertraline  50 mg Oral Daily   tamsulosin  0.4 mg Oral Daily   traZODone  150 mg Oral QHS   Warfarin - Pharmacist Dosing Inpatient   Does not apply q1600    Infusions:  heparin 500 Units/hr (06/21/22 2300)   vancomycin Stopped (06/21/22 2112)    PRN Medications: acetaminophen,  albuterol, morphine injection, ondansetron (ZOFRAN) IV, mouth rinse, traMADol   Patient Profile   61 y.o. male with history of HM III LVAD, chronic systolic CHF/ICM, COPD, PAF. 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   Readmitted for driveline infection and surgical debridement/possible wound vac placement.  Assessment/Plan:    1. Driveline infection: Recent driveline infection [id number]. S/p driveline site debridement 6/8. Surgical wound cultures grew MRSA, repeat debridement 6/23. Driveline debridement with wound vac placement on 7/7.  - Transitioned from daptomycin to vancomycin due to elevated CK.  CK trending down, so far no problems with vancomycin. Vancomycin planned x 6 wks.  - 7/13 VAC dressing change.  - Plan to return to OR next Thursday.   - Continue heparin drip and warfarin. INR 1,3. Discussed dosing with PharmD personally. 2.  Chronic systolic CHF: Echo [id number] with EF 20-25%, mildly decreased RV function. LHC/RHC in 12/21 with patent grafts, low output. Suspect mixed ischemic/nonischemic cardiomyopathy (prior heavy ETOH and drugs as well as CAD).  No ETOH, drugs, smoking since CABG in 12/20. Admitted with cardiogenic shock in 12/21, had placement of Impella 5.5 initially, now s/p Heartmate 3 LVAD on 11/17/20.  Ramp echo 2/23 with speed decreased to 5500 rpm. He would eventually like heart transplant. NYHA class II. Have been allowing MAP to run higher due to recent AKI thought to be due to lowering BP too much in setting of right renal artery stenosis.  - Had Ramp ECHO with speed optimized.  MAP 90s .INR stable at 1.3 today on warfarin.+ heparin drip.  LDH stable 164 - Volume status looks good - Continue hydralazine 25 mg TID - Continue Toprol XL 25 mg daily.  - Continue warfarin/low dose heparin.  3. AKI: Creatinine up to 4.6 last admission, initially thought this was ATN from low BP but he was never markedly hypotensive and LFTs without any  evidence of shock liver. ? Vanc toxicity (troughs were ok). Taken off Entresto and hydralazine. Volume repleted with IVF. In reviewing prior CTA abdomen/pelvis, he has high-grade stenosis of proximal right renal artery. D/w Renal and feel like he may need higher perfusion pressures. Creatinine down to 1.3-1.4 range - Would consider reassessment of renal artery stenosis and possible treatment once creatinine has improved to baseline.  - Watch MAPs, avoid hypotension 4.  CAD: S/p CABG 12/20.  LHC pre-VAD with patent grafts, no target for intervention. - Stopped statin with elevated  CK => this was in setting of daptomycin.  -CK down to 125 , statin restarted.  CK today 59.  5.Atrial fibrillation: Paroxysmal.  S/p Maze and LA appendage clip with CABG in 12/20. IN SR.  - Continue Toprol XL 25 mg daily.  - Off amiodarone with hyperthyroidism. - On Warfarin + heparin.  6.  Type 2 diabetes: Takes glipizide at home.  - SSI  - Continue Levemir 15 units qHS - Diabetes coordinator following 7. 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  8. 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  He missed his endocrinology appt. Has been rescheduled for July 27th. - He is now off amiodarone.   - Continue methimazole 20 mg bid, TSH was mildly elevated.   9.  Gout: On allopurinol.  10. Anemia (chronic blood loss):  - Got 1u RBCs on 7/12 Hgb 7.5-> 9.3  today. No obvious source of bleedin   Reengage HF CM/SW to coordinate [facility], IV antibiotics and wound vac.  Plans to return to the OR next Thursday.    I reviewed the LVAD parameters from today, and compared the results to the patient's prior recorded data.  No programming changes were made.  The LVAD is functioning within specified parameters.  The patient performs LVAD self-test daily.  LVAD interrogation was negative for any significant power changes, alarms or PI events/speed drops.  LVAD equipment check completed and is in good  working order.  Back-up equipment present.   LVAD education done on emergency procedures and precautions and reviewed exit site care.  Length of Stay: 10  Arvilla Meres, MD 06/22/2022, 7:02 AM  VAD Team --- VAD ISSUES ONLY--- Pager [phone] (7am - 7am)  Advanced Heart Failure Team  Pager [phone] (M-F; 7a - 5p)  Please contact [facility] for night-coverage after hours (5p -7a ) and weekends on [email or web address]

## 2022-06-23 DIAGNOSIS — Z95811 Presence of heart assist device: Secondary | ICD-10-CM | POA: Diagnosis not present

## 2022-06-23 DIAGNOSIS — T827XXA Infection and inflammatory reaction due to other cardiac and vascular devices, implants and grafts, initial encounter: Secondary | ICD-10-CM | POA: Diagnosis not present

## 2022-06-23 LAB — CBC
HCT: 32 % — ABNORMAL LOW (ref 39.0–52.0)
Hemoglobin: 10.2 g/dL — ABNORMAL LOW (ref 13.0–17.0)
MCH: 27.4 pg (ref 26.0–34.0)
MCHC: 31.9 g/dL (ref 30.0–36.0)
MCV: 86 fL (ref 80.0–100.0)
Platelets: 229 10*3/uL (ref 150–400)
RBC: 3.72 MIL/uL — ABNORMAL LOW (ref 4.22–5.81)
RDW: 18.5 % — ABNORMAL HIGH (ref 11.5–15.5)
WBC: 10.1 10*3/uL (ref 4.0–10.5)
nRBC: 0 % (ref 0.0–0.2)

## 2022-06-23 LAB — PROTIME-INR
INR: 1.4 — ABNORMAL HIGH (ref 0.8–1.2)
Prothrombin Time: 16.9 seconds — ABNORMAL HIGH (ref 11.4–15.2)

## 2022-06-23 LAB — GLUCOSE, CAPILLARY
Glucose-Capillary: 148 mg/dL — ABNORMAL HIGH (ref 70–99)
Glucose-Capillary: 142 mg/dL — ABNORMAL HIGH (ref 70–99)
Glucose-Capillary: 175 mg/dL — ABNORMAL HIGH (ref 70–99)
Glucose-Capillary: 194 mg/dL — ABNORMAL HIGH (ref 70–99)

## 2022-06-23 LAB — BASIC METABOLIC PANEL
Anion gap: 11 (ref 5–15)
BUN: 31 mg/dL — ABNORMAL HIGH (ref 8–23)
CO2: 25 mmol/L (ref 22–32)
Calcium: 8.7 mg/dL — ABNORMAL LOW (ref 8.9–10.3)
Chloride: 101 mmol/L (ref 98–111)
Creatinine, Ser: 1.44 mg/dL — ABNORMAL HIGH (ref 0.61–1.24)
GFR, Estimated: 55 mL/min — ABNORMAL LOW (ref >60)
Glucose, Bld: 152 mg/dL — ABNORMAL HIGH (ref 70–99)
Potassium: 4.2 mmol/L (ref 3.5–5.1)
Sodium: 137 mmol/L (ref 135–145)

## 2022-06-23 LAB — HEPARIN LEVEL (UNFRACTIONATED): Heparin Unfractionated: <0.1 IU/mL — ABNORMAL LOW (ref 0.30–0.70)

## 2022-06-23 LAB — LACTATE DEHYDROGENASE: LDH: 155 U/L (ref 98–192)

## 2022-06-23 MED ORDER — WARFARIN SODIUM 7.5 MG PO TABS
7.5000 mg | ORAL_TABLET | Freq: Once | ORAL | Status: AC
  Administered 2022-06-23: 7.5 mg via ORAL
  Filled 2022-06-23: qty 1

## 2022-06-23 NOTE — Progress Notes (Signed)
Patient ID: Kevin Underwood, male   DOB: [birth year], 61 y.o.   MRN: [id number]   Advanced Heart Failure VAD Team Note  PCP-Cardiologist: Rozann Lesches, MD   Subjective:    S/p driveline site debridement and wound vac on 7/7.  7/10 Had Ramp ECHO. Speed optimized. 7/13 OR to [facility] dressing change.  Back on Heparin drip.   Crestor and daptomycin stopped w/ elevated CK level, no muscle pain.  CK 1000 => 757 => 598=>125=>59 .   Back on statin. Now on vancomycin.    SCr stable, 1.33 -> 1.41 -> 1,44  On heparin. No bleeding Hgb 10.2  Denies CP, SOB, ab pain. No f/c. Wound vac working well   LVAD INTERROGATION:  HeartMate III LVAD:   Flow 4.3  liters/min, speed 5600, power 4.0, PI 4.3  VAD interrogated personally. Parameters stable.   Objective:    Vital Signs:   Temp:  [97.7 F (36.5 C)-98.7 F (37.1 C)] 98.6 F (37 C) (07/16 0730) Pulse Rate:  [70-99] 70 (07/16 0439) Resp:  [15-22] 22 (07/16 0730) BP: (104-127)/(69-100) 104/78 (07/16 0730) SpO2:  [96 %-100 %] 96 % (07/16 0730) Last BM Date : 06/22/22 Mean arterial Pressure  90-100 Intake/Output:   Intake/Output Summary (Last 24 hours) at 06/23/2022 0859 Last data filed at 06/23/2022 0700 Gross per 24 hour  Intake 360 ml  Output 1030 ml  Net -670 ml      Physical Exam   General:  NAD.  HEENT: normal  Neck: supple. JVP not elevated.  Carotids 2+ bilat; no bruits. No lymphadenopathy or thryomegaly appreciated. Cor: LVAD hum.  Lungs: Clear. Abdomen: obese soft, nontender, non-distended. No hepatosplenomegaly. No bruits or masses. Good bowel sounds.Wound vac looks good. Good sunction  Extremities: no cyanosis, clubbing, rash. Warm no edema  Neuro: alert & oriented x 3. No focal deficits. Moves all 4 without problem     Telemetry   SR 70sPersonally reviewed  Labs   Basic Metabolic Panel: Recent Labs  Lab 06/19/22 0340 06/20/22 0510 06/21/22 0500 06/22/22 0500 06/23/22 0705  NA 137 138 135 138 137  K 4.3  4.2 4.3 4.0 4.2  CL 101 104 101 104 101  CO2 26 26 24 25 25   GLUCOSE 116* 113* 121* 63* 152*  BUN 32* 29* 31* 34* 31*  CREATININE 1.58* 1.48* 1.33* 1.41* 1.44*  CALCIUM 8.7* 8.7* 8.6* 8.9 8.7*     Liver Function Tests: No results for input(s): "AST", "ALT", "ALKPHOS", "BILITOT", "PROT", "ALBUMIN" in the last 168 hours.  No results for input(s): "LIPASE", "AMYLASE" in the last 168 hours. No results for input(s): "AMMONIA" in the last 168 hours.  CBC: Recent Labs  Lab 06/19/22 0340 06/20/22 0510 06/21/22 0500 06/21/22 2014 06/22/22 0500 06/23/22 0705  WBC 10.2 10.2 12.3*  --  12.2* 10.1  HGB 8.2* 8.3* 7.5* 9.7* 9.9* 10.2*  HCT 26.2* 26.2* 23.6* 29.2* 29.4* 32.0*  MCV 84.2 85.1 84.9  --  83.8 86.0  PLT 297 283 252  --  236 229     INR: Recent Labs  Lab 06/19/22 0340 06/20/22 0510 06/21/22 0500 06/22/22 0500 06/23/22 0705  INR 1.1 1.2 1.3* 1.3* 1.4*     Other results: EKG:    Imaging   No results found.   Medications:     Scheduled Medications:  acetaminophen  1,000 mg Oral Once   allopurinol  100 mg Oral Daily   Chlorhexidine Gluconate Cloth  6 each Topical Daily   docusate sodium  200  mg Oral BID   gabapentin  600 mg Oral BID   guaiFENesin  600 mg Oral BID   hydrALAZINE  25 mg Oral TID   insulin aspart  0-15 Units Subcutaneous TID WC   insulin aspart  0-5 Units Subcutaneous QHS   insulin detemir  15 Units Subcutaneous QHS   lactose free nutrition  237 mL Oral TID WC   loratadine  10 mg Oral Daily   methimazole  20 mg Oral BID   metoprolol succinate  25 mg Oral Daily   pantoprazole  40 mg Oral Daily   polyethylene glycol  17 g Oral Daily   rosuvastatin  20 mg Oral Daily   sertraline  50 mg Oral Daily   tamsulosin  0.4 mg Oral Daily   traZODone  150 mg Oral QHS   Warfarin - Pharmacist Dosing Inpatient   Does not apply q1600    Infusions:  heparin 500 Units/hr (06/22/22 1642)   vancomycin 1,000 mg (06/22/22 2217)    PRN  Medications: acetaminophen, albuterol, morphine injection, ondansetron (ZOFRAN) IV, mouth rinse, traMADol   Patient Profile   61 y.o. male with history of HM III LVAD, chronic systolic CHF/ICM, COPD, PAF. 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   Readmitted for driveline infection and surgical debridement/possible wound vac placement.  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 S/p driveline site debridement 6/8. Surgical wound cultures grew MRSA, repeat debridement 6/23. Driveline debridement with wound vac placement on 7/7.  - Transitioned from daptomycin to vancomycin due to elevated CK.  CK trending down, so far no problems with vancomycin. Vancomycin planned x 6 wks.  - 7/13 VAC dressing change.  - Vac working well  - Plan to return to OR on Thursday  - Continue heparin drip and warfarin. INR 1.4 Discussed dosing with PharmD personally. 2.  Chronic systolic CHF: Echo 10/21 with EF 20-25%, mildly decreased RV function. LHC/RHC in 12/21 with patent grafts, low output. Suspect mixed ischemic/nonischemic cardiomyopathy (prior heavy ETOH and drugs as well as CAD).  No ETOH, drugs, smoking since CABG in 12/20. Admitted with cardiogenic shock in 12/21, had placement of Impella 5.5 initially, now s/p Heartmate 3 LVAD on 11/17/20.  Ramp echo 2/23 with speed decreased to 5500 rpm. He would eventually like heart transplant. NYHA class II. Have been allowing MAP to run higher due to recent AKI thought to be due to lowering BP too much in setting of right renal artery stenosis.  - Had Ramp ECHO with speed optimized.  MAP 90s .INR stable at 1.4 today on warfarin.+ heparin drip.  LDH stable 155 - Volume  status ok - Continue hydralazine 25 mg TID - Continue Toprol XL 25 mg daily.  - Continue warfarin/low dose heparin.  3. AKI: Creatinine up to 4.6 last admission, initially thought this was ATN from low BP but he was never  markedly hypotensive and LFTs without any evidence of shock liver. ? Vanc toxicity (troughs were ok). Taken off Entresto and hydralazine. Volume repleted with IVF. In reviewing prior CTA abdomen/pelvis, he has high-grade stenosis of proximal right renal artery. D/w Renal and feel like he may need higher perfusion pressures. Creatinine down to 1.3-1.4 range - Would consider reassessment of renal artery stenosis and possible treatment once creatinine has improved to baseline.  - Watch MAPs, avoid hypotension 4.  CAD: S/p CABG 12/20.  LHC pre-VAD with patent grafts, no target for intervention. - Stopped statin  with elevated CK => this was in setting of daptomycin.  -CK down to 125 , statin restarted.  CK today 59.  5.Atrial fibrillation: Paroxysmal.  S/p Maze and LA appendage clip with CABG in 12/20. IIn NSR today - Continue Toprol XL 25 mg daily.  - Off amiodarone with hyperthyroidism. - On Warfarin + heparin.  6.  Type 2 diabetes: Takes glipizide at home.  - SSI  - Continue Levemir 15 units qHS - Diabetes coordinator following 7. 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  8. 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  He missed his endocrinology appt. Has been rescheduled for July 27th. - He is now off amiodarone.   - Continue methimazole 20 mg bid, TSH was mildly elevated.   9.  Gout: On allopurinol.  10. Anemia (chronic blood loss):  - Got 1u RBCs on 7/12 Hgb 7.5-> 9.3 -> 10.2  today. No obvious source of bleeding    Reengage HF CM/SW to coordinate HH, IV antibiotics and wound vac.  Plans to return to the OR next Thursday.    I reviewed the LVAD parameters from today, and compared the results to the patient's prior recorded data.  No programming changes were made.  The LVAD is functioning within specified parameters.  The patient performs LVAD self-test daily.  LVAD interrogation was negative for any significant power changes, alarms or PI  events/speed drops.  LVAD equipment check completed and is in good working order.  Back-up equipment present.   LVAD education done on emergency procedures and precautions and reviewed exit site care.  Length of Stay: 74  Arvilla Meres, MD 06/23/2022, 8:59 AM  VAD Team --- VAD ISSUES ONLY--- Pager [phone] (7am - 7am)  Advanced Heart Failure Team  Pager [phone] (M-F; 7a - 5p)  Please contact [facility] for night-coverage after hours (5p -7a ) and weekends on [email or web address]

## 2022-06-23 NOTE — Progress Notes (Addendum)
ANTICOAGULATION CONSULT NOTE  Pharmacy Consult for heparin + warfarin Indication:  LVAD  No Known Allergies  Patient Measurements: Height: 5\' 10"  (177.8 cm) Weight: 84.9 kg (187 lb 2.7 oz) IBW/kg (Calculated) : 73 Heparin Dosing Weight: 80kg  Vital Signs: Temp: 97.8 F (36.6 C) (07/16 1107) Temp Source: Oral (07/16 1107) BP: 117/98 (07/16 1107) Pulse Rate: 70 (07/16 0439)  Labs: Recent Labs    06/21/22 0500 06/21/22 2014 06/22/22 0500 06/23/22 0500 06/23/22 0705  HGB 7.5* 9.7* 9.9*  --  10.2*  HCT 23.6* 29.2* 29.4*  --  32.0*  PLT 252  --  236  --  229  LABPROT 16.4*  --  16.0*  --  16.9*  INR 1.3*  --  1.3*  --  1.4*  HEPARINUNFRC 0.17*  --  <0.10* <0.10*  --   CREATININE 1.33*  --  1.41*  --  1.44*  CKTOTAL 59  --   --   --   --      Estimated Creatinine Clearance: 55.6 mL/min (A) (by C-G formula based on SCr of 1.44 mg/dL (H)).   Medical History: Past Medical History:  Diagnosis Date   "    Arthritis    CAD (coronary artery disease)    a. s/p CABG in 11/2019 with LIMA-LAD, SVG-OM1, SVG-PDA and SVG-D1   CHF (congestive heart failure) (HCC)    a. EF < 20% by echo in 11/2019   Essential hypertension    PAF (paroxysmal atrial fibrillation) (HCC)    Type 2 diabetes mellitus (HCC)     Assessment: 15 yoM with HM3 LVAD admitted with ongoing LVAD driveline infection. Pt with planned debridements in OR. Pt is on warfarin PTA with last dose taken 7/3.   Pt s/p OR for infected wound debridement 7/7 and 7/13. Heparin to resumed postop. Planning for repeat OR visit next week. Per Dr. 8/13 - ok to continue warfarin with INR < / = 2.2 for procedures. CBC and LDH stable. INR 1.4 today, heparin level undetectable as expected with low-dosing, CBC down but no S/Sx bleeding and minimal drainage in vac.  *Home warfarin dose 6mg  MWF, 4mg  AODs  Goal of Therapy:  INR goal 2-2.5 Heparin level < 0.3 units/ml Monitor platelets by anticoagulation protocol: Yes   Plan:   -Warfarin 7.5 mg x 1  again tonight -Continue heparin at 500 units/h -Daily INR, heparin level, CBC, LDH   Donata Clay, , [facility] Clinical Pharmacist  06/23/2022 11:37 AM   [facility] pharmacy phone numbers are listed on [email or web address]

## 2022-06-24 ENCOUNTER — Encounter (HOSPITAL_COMMUNITY): Payer: Self-pay | Admitting: Cardiothoracic Surgery

## 2022-06-24 DIAGNOSIS — T827XXA Infection and inflammatory reaction due to other cardiac and vascular devices, implants and grafts, initial encounter: Secondary | ICD-10-CM | POA: Diagnosis not present

## 2022-06-24 DIAGNOSIS — Z95811 Presence of heart assist device: Secondary | ICD-10-CM | POA: Diagnosis not present

## 2022-06-24 LAB — CBC
HCT: 33.7 % — ABNORMAL LOW (ref 39.0–52.0)
Hemoglobin: 10.7 g/dL — ABNORMAL LOW (ref 13.0–17.0)
MCH: 27.6 pg (ref 26.0–34.0)
MCHC: 31.8 g/dL (ref 30.0–36.0)
MCV: 87.1 fL (ref 80.0–100.0)
Platelets: 235 10*3/uL (ref 150–400)
RBC: 3.87 MIL/uL — ABNORMAL LOW (ref 4.22–5.81)
RDW: 18.6 % — ABNORMAL HIGH (ref 11.5–15.5)
WBC: 10.9 10*3/uL — ABNORMAL HIGH (ref 4.0–10.5)
nRBC: 0 % (ref 0.0–0.2)

## 2022-06-24 LAB — GLUCOSE, CAPILLARY
Glucose-Capillary: 166 mg/dL — ABNORMAL HIGH (ref 70–99)
Glucose-Capillary: 163 mg/dL — ABNORMAL HIGH (ref 70–99)
Glucose-Capillary: 215 mg/dL — ABNORMAL HIGH (ref 70–99)
Glucose-Capillary: 239 mg/dL — ABNORMAL HIGH (ref 70–99)

## 2022-06-24 LAB — BASIC METABOLIC PANEL
Anion gap: 10 (ref 5–15)
BUN: 31 mg/dL — ABNORMAL HIGH (ref 8–23)
CO2: 26 mmol/L (ref 22–32)
Calcium: 8.9 mg/dL (ref 8.9–10.3)
Chloride: 102 mmol/L (ref 98–111)
Creatinine, Ser: 2.01 mg/dL — ABNORMAL HIGH (ref 0.61–1.24)
GFR, Estimated: 37 mL/min — ABNORMAL LOW (ref >60)
Glucose, Bld: 167 mg/dL — ABNORMAL HIGH (ref 70–99)
Potassium: 4.3 mmol/L (ref 3.5–5.1)
Sodium: 138 mmol/L (ref 135–145)

## 2022-06-24 LAB — PROTIME-INR
INR: 1.5 — ABNORMAL HIGH (ref 0.8–1.2)
Prothrombin Time: 17.6 seconds — ABNORMAL HIGH (ref 11.4–15.2)

## 2022-06-24 LAB — HEPARIN LEVEL (UNFRACTIONATED): Heparin Unfractionated: <0.1 IU/mL — ABNORMAL LOW (ref 0.30–0.70)

## 2022-06-24 LAB — VANCOMYCIN, TROUGH: Vancomycin Tr: 12 ug/mL — ABNORMAL LOW (ref 15–20)

## 2022-06-24 LAB — LACTATE DEHYDROGENASE: LDH: 160 U/L (ref 98–192)

## 2022-06-24 MED ORDER — SORBITOL 70 % SOLN: 30.0000 mL | Freq: Once | Status: DC

## 2022-06-24 MED ORDER — WARFARIN SODIUM 7.5 MG PO TABS
7.5000 mg | ORAL_TABLET | Freq: Once | ORAL | Status: AC
  Administered 2022-06-24: 7.5 mg via ORAL
  Filled 2022-06-24: qty 1

## 2022-06-24 MED ORDER — SORBITOL 70 % SOLN
30.0000 mL | Freq: Once | Status: AC
Start: 2022-06-24 — End: 2022-06-24
  Administered 2022-06-24: 30 mL via ORAL
  Filled 2022-06-24: qty 30

## 2022-06-24 MED ORDER — SORBITOL 70 % SOLN
30.0000 mL | Freq: Every day | Status: DC | PRN
  Administered 2022-06-25 – 2022-06-30 (×3): 30 mL via ORAL
  Filled 2022-06-24 (×4): qty 30

## 2022-06-24 NOTE — Progress Notes (Signed)
Pharmacy Antibiotic Note  Kevin Underwood is a 61 y.o. male admitted on 06/12/2022 with LVAD Driveline infection s/p debridement  7/7 and 7/13. Noted patient was on Daptomycin with CK increased up to 1000, pt transitioned back to vancomycin.   Scr jumped today from 1.44 to 2.01. CrCl ~ 40 ml/min.    Plan: Continue Vancomycin 1000 mg every 24 hours - Predicted AUC still around 442  Will check levels today  Monitor debridements, renal function, levels    Height: 5\' 10"  (177.8 cm) Weight: 83.7 kg (184 lb 8.4 oz) IBW/kg (Calculated) : 73  Temp (24hrs), Avg:98 F (36.7 C), Min:97.8 F (36.6 C), Max:98.4 F (36.9 C)  Recent Labs  Lab 06/17/22 2250 06/18/22 0330 06/18/22 1855 06/19/22 0340 06/20/22 0510 06/21/22 0500 06/22/22 0500 06/23/22 0705 06/24/22 0500  WBC  --    < >  --    < > 10.2 12.3* 12.2* 10.1 10.9*  CREATININE  --    < >  --    < > 1.48* 1.33* 1.41* 1.44* 2.01*  VANCOTROUGH  --   --  13*  --   --   --   --   --   --   VANCOPEAK 34  --   --   --   --   --   --   --   --    < > = values in this interval not displayed.     Estimated Creatinine Clearance: 39.8 mL/min (A) (by C-G formula based on SCr of 2.01 mg/dL (H)).    No Known Allergies    Thank you for allowing pharmacy to be a part of this patient's care.  06/26/22, PharmD, BCPS, BCIDP Infectious Diseases Clinical Pharmacist Phone: [phone] 06/24/2022

## 2022-06-24 NOTE — Progress Notes (Signed)
ANTICOAGULATION CONSULT NOTE  Pharmacy Consult for heparin + warfarin Indication:  LVAD  No Known Allergies  Patient Measurements: Height: 5\' 10"  (177.8 cm) Weight: 83.7 kg (184 lb 8.4 oz) IBW/kg (Calculated) : 73 Heparin Dosing Weight: 80kg  Vital Signs: Temp: 98.1 F (36.7 C) (07/17 0340) Temp Source: Oral (07/17 0340) BP: 107/81 (07/17 0340) Pulse Rate: 78 (07/17 0340)  Labs: Recent Labs    06/22/22 0500 06/23/22 0500 06/23/22 0705 06/24/22 0500  HGB 9.9*  --  10.2* 10.7*  HCT 29.4*  --  32.0* 33.7*  PLT 236  --  229 235  LABPROT 16.0*  --  16.9* 17.6*  INR 1.3*  --  1.4* 1.5*  HEPARINUNFRC <0.10* <0.10*  --  <0.10*  CREATININE 1.41*  --  1.44* 2.01*     Estimated Creatinine Clearance: 39.8 mL/min (A) (by C-G formula based on SCr of 2.01 mg/dL (H)).   Medical History: Past Medical History:  Diagnosis Date   "    Arthritis    CAD (coronary artery disease)    a. s/p CABG in 11/2019 with LIMA-LAD, SVG-OM1, SVG-PDA and SVG-D1   CHF (congestive heart failure) (HCC)    a. EF < 20% by echo in 11/2019   Essential hypertension    PAF (paroxysmal atrial fibrillation) (HCC)    Type 2 diabetes mellitus (HCC)     Assessment: 36 yoM with HM3 LVAD admitted with ongoing LVAD driveline infection. Pt with planned debridements in OR. Pt is on warfarin PTA with last dose taken 7/3.   Pt s/p OR for infected wound debridement 7/7 and 7/13. Per Dr. 8/13 - ok to continue warfarin with INR < / = 2.2 for procedures. CBC and LDH stable. INR 1.5 today, heparin level undetectable as expected with low-dosing, CBC stable with no S/Sx bleeding and minimal drainage in vac.  *Home warfarin dose 6mg  MWF, 4mg  AODs  Goal of Therapy:  INR goal 2-2.5 Heparin level < 0.3 units/ml Monitor platelets by anticoagulation protocol: Yes   Plan:  -Warfarin 7.5 mg x 1  again tonight -Continue heparin at 500 units/h -Daily INR, heparin level, CBC, LDH -Plan to return to OR again on  Thursday 7/20  PharmD., BCPS Clinical Pharmacist 06/24/2022 7:48 AM

## 2022-06-24 NOTE — Progress Notes (Addendum)
Patient ID: Kevin Underwood, male   DOB: 06/21/1961, 61 y.o.   MRN: [id number]   Advanced Heart Failure VAD Team Note  PCP-Cardiologist: Nona Dell, MD   Subjective:    S/p driveline site debridement and wound vac on 7/7.  7/10 Had Ramp ECHO. Speed optimized. 7/13 OR to [facility] dressing change.  Back on Heparin drip.   Crestor and daptomycin stopped w/ elevated CK level, no muscle pain.  CK 1000 => 757 => 598=>125=>59 .   Back on statin. Now on vancomycin.    SCr stable, 1.33 -> 1.41 -> 1.44 -> 2.01  MAPs 90s-100s  On heparin. No bleeding Hgb 10.7.  Feeling well. No pain. Denies fever, chills. Has been up ambulating halls every day   LVAD INTERROGATION:  HeartMate III LVAD:   Flow 4.1  liters/min, speed 5600, power 4.0, PI 4.2. 16 PI events. VAD interrogated personally. Parameters stable.   Objective:    Vital Signs:   Temp:  [97.8 F (36.6 C)-98.4 F (36.9 C)] 98.3 F (36.8 C) (07/17 0742) Pulse Rate:  [75-78] 78 (07/17 0340) Resp:  [12-19] 19 (07/17 0742) BP: (107-138)/(81-105) 130/85 (07/17 0742) SpO2:  [95 %-98 %] 95 % (07/17 0742) Weight:  [83.7 kg] 83.7 kg (07/17 0458) Last BM Date : 06/23/22 Mean arterial Pressure  90s-100s Intake/Output:   Intake/Output Summary (Last 24 hours) at 06/24/2022 0824 Last data filed at 06/24/2022 0758 Gross per 24 hour  Intake 1172.98 ml  Output 1320 ml  Net -147.02 ml     Physical Exam   Physical Exam: GENERAL: Well appearing. Sitting up in chair. HEENT: normal  NECK: Supple, No JVD.  2+ bilaterally, no bruits.   CARDIAC:  Mechanical heart sounds with LVAD hum present.  LUNGS:  Clear to auscultation bilaterally.  ABDOMEN:  Soft, round, nontender, positive bowel sounds x4.     LVAD exit site: wound vac with good suction EXTREMITIES:  Warm and dry, no cyanosis, clubbing, rash or edema  NEUROLOGIC:  Alert and oriented x 4.  Gait steady.  No aphasia.  No dysarthria.  Affect pleasant.        Telemetry   SR 90s  Labs    Basic Metabolic Panel: Recent Labs  Lab 06/20/22 0510 06/21/22 0500 06/22/22 0500 06/23/22 0705 06/24/22 0500  NA 138 135 138 137 138  K 4.2 4.3 4.0 4.2 4.3  CL 104 101 104 101 102  CO2 26 24 25 25 26   GLUCOSE 113* 121* 63* 152* 167*  BUN 29* 31* 34* 31* 31*  CREATININE 1.48* 1.33* 1.41* 1.44* 2.01*  CALCIUM 8.7* 8.6* 8.9 8.7* 8.9    Liver Function Tests: No results for input(s): "AST", "ALT", "ALKPHOS", "BILITOT", "PROT", "ALBUMIN" in the last 168 hours.  No results for input(s): "LIPASE", "AMYLASE" in the last 168 hours. No results for input(s): "AMMONIA" in the last 168 hours.  CBC: Recent Labs  Lab 06/20/22 0510 06/21/22 0500 06/21/22 2014 06/22/22 0500 06/23/22 0705 06/24/22 0500  WBC 10.2 12.3*  --  12.2* 10.1 10.9*  HGB 8.3* 7.5* 9.7* 9.9* 10.2* 10.7*  HCT 26.2* 23.6* 29.2* 29.4* 32.0* 33.7*  MCV 85.1 84.9  --  83.8 86.0 87.1  PLT 283 252  --  236 229 235    INR: Recent Labs  Lab 06/20/22 0510 06/21/22 0500 06/22/22 0500 06/23/22 0705 06/24/22 0500  INR 1.2 1.3* 1.3* 1.4* 1.5*    Other results: EKG:    Imaging   No results found.   Medications:  Scheduled Medications:  acetaminophen  1,000 mg Oral Once   allopurinol  100 mg Oral Daily   Chlorhexidine Gluconate Cloth  6 each Topical Daily   docusate sodium  200 mg Oral BID   gabapentin  600 mg Oral BID   guaiFENesin  600 mg Oral BID   hydrALAZINE  25 mg Oral TID   insulin aspart  0-15 Units Subcutaneous TID WC   insulin aspart  0-5 Units Subcutaneous QHS   insulin detemir  15 Units Subcutaneous QHS   lactose free nutrition  237 mL Oral TID WC   loratadine  10 mg Oral Daily   methimazole  20 mg Oral BID   metoprolol succinate  25 mg Oral Daily   pantoprazole  40 mg Oral Daily   polyethylene glycol  17 g Oral Daily   rosuvastatin  20 mg Oral Daily   sertraline  50 mg Oral Daily   tamsulosin  0.4 mg Oral Daily   traZODone  150 mg Oral QHS   warfarin  7.5 mg Oral ONCE-1600    Warfarin - Pharmacist Dosing Inpatient   Does not apply q1600    Infusions:  heparin 500 Units/hr (06/24/22 0400)   vancomycin Stopped (06/23/22 2203)    PRN Medications: acetaminophen, albuterol, morphine injection, ondansetron (ZOFRAN) IV, mouth rinse, traMADol   Patient Profile   61 y.o. male with history of HM III LVAD, chronic systolic CHF/ICM, COPD, PAF. 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   Readmitted for driveline infection and surgical debridement/possible wound vac placement.  Assessment/Plan:    1. Driveline infection: Recent driveline infection [id number]. S/p driveline site debridement 6/8. Surgical wound cultures grew MRSA, repeat debridement 6/23. Driveline debridement with wound vac placement on 7/7.  - Transitioned from daptomycin to vancomycin due to elevated CK.  CK trending down, so far no problems with vancomycin. Vancomycin planned x 6 wks. Scr trending up today. See below. - 7/13 VAC dressing change.  - Vac working well  - Plan to return to OR on Thursday  - Continue heparin drip and warfarin. INR 1.5 Discussed dosing with PharmD personally. 2.  Chronic systolic CHF: Echo [id number] with EF 20-25%, mildly decreased RV function. LHC/RHC in 12/21 with patent grafts, low output. Suspect mixed ischemic/nonischemic cardiomyopathy (prior heavy ETOH and drugs as well as CAD).  No ETOH, drugs, smoking since CABG in 12/20. Admitted with cardiogenic shock in 12/21, had placement of Impella 5.5 initially, now s/p Heartmate 3 LVAD on 11/17/20.  Ramp echo 2/23 with speed decreased to 5500 rpm. He would eventually like heart transplant. NYHA class II. Have been allowing MAP to run higher due to recent AKI thought to be due to lowering BP too much in setting of right renal artery stenosis.  - Had Ramp ECHO with speed optimized.  MAP 90s - 100s .INR stable at 1.5 today on warfarin.+ heparin drip.  LDH stable 160 - Volume  status ok -  Continue hydralazine 25 mg TID - Continue Toprol XL 25 mg daily.  - Continue warfarin/low dose heparin.  3. AKI: Creatinine up to 4.6 last admission, initially thought this was ATN from low BP but he was never markedly hypotensive and LFTs without any evidence of shock liver. ? Vanc toxicity (troughs were ok). Taken off Entresto and hydralazine. Volume repleted with IVF. In reviewing prior CTA abdomen/pelvis, he has high-grade stenosis of proximal right renal artery. D/w Renal and feel like he may need higher perfusion pressures.  Creatinine down to 1.3-1.4 range - Scr back up to 2.01 this am. MAPs have been okay. Reports good UOP. Check vanc trough. - Would consider reassessment of renal artery stenosis and possible treatment once creatinine has improved to baseline.  4.  CAD: S/p CABG 12/20.  LHC pre-VAD with patent grafts, no target for intervention. - Stopped statin with elevated CK => this was in setting of daptomycin.  -CK down to 125 , statin restarted.  CK down further to 59.  5.Atrial fibrillation: Paroxysmal.  S/p Maze and LA appendage clip with CABG in 12/20. IIn NSR today - Continue Toprol XL 25 mg daily.  - Off amiodarone with hyperthyroidism. - On Warfarin + heparin.  6.  Type 2 diabetes: Takes glipizide at home.  - SSI  - Continue Levemir 15 units qHS - Diabetes coordinator following 7. 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  8. 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  He missed his endocrinology appt. Has been rescheduled for July 27th. - He is now off amiodarone.   - Continue methimazole 20 mg bid, TSH was mildly elevated.   9.  Gout: On allopurinol.  10. Anemia (chronic blood loss):  - Got 1u RBCs on 7/12 Hgb 7.5-> 9.3 -> 10.2 -> 10.7 today. No obvious source of bleeding    TOC CM/SW to coordinate HH, IV antibiotics and wound vac.   Plans to return to the OR 07/20.   I reviewed the LVAD parameters from today, and compared  the results to the patient's prior recorded data.  No programming changes were made.  The LVAD is functioning within specified parameters.  The patient performs LVAD self-test daily.  LVAD interrogation was negative for any significant power changes, alarms or PI events/speed drops.  LVAD equipment check completed and is in good working order.  Back-up equipment present.   LVAD education done on emergency procedures and precautions and reviewed exit site care.  Length of Stay: [address], PA-C 06/24/2022, 8:24 AM  VAD Team --- VAD ISSUES ONLY--- Pager [phone] (7am - 7am)  Advanced Heart Failure Team  Pager [phone] (M-F; 7a - 5p)  Please contact [facility] for night-coverage after hours (5p -7a ) and weekends on [email or web address]  Patient seen and examined with the above-signed Advanced Practice Provider and/or Housestaff. I personally reviewed laboratory data, imaging studies and relevant notes. I independently examined the patient and formulated the important aspects of the plan. I have edited the note to reflect any of my changes or salient points. I have personally discussed the plan with the patient and/or family.  Feels well. Denies SOB, orthopnea or PND. No fevers or chills. Wound vac working well. Scr up. MAPs have been stable. Not getting diuretics.   General:  NAD.  HEENT: normal  Neck: supple. JVP not elevated.  Carotids 2+ bilat; no bruits. No lymphadenopathy or thryomegaly appreciated. Cor: LVAD hum.  Lungs: Clear. Abdomen: obese soft, nontender, non-distended. No hepatosplenomegaly. No bruits or masses. Good bowel sounds. Vac site ok   Extremities: no cyanosis, clubbing, rash. Warm no edema  Neuro: alert & oriented x 3. No focal deficits. Moves all 4 without problem   Wound stable with vac in place. No fevers. Scr back up again. MAPs looks stable. No diuretics. Agree with checking vanc trough. Keep MAP >= 90.   VAD interrogated personally. Parameters stable. Back to  OR on Thursday. Continue heparin.   Arvilla Meres, MD  9:11 AM

## 2022-06-24 NOTE — Progress Notes (Signed)
LVAD Coordinator Rounding Note:  Admitted 06/12/22 to Dr. Alford Highland service due to drive line debridement scheduled with Dr Donata Clay 06/14/22.   S/p driveline site debridement and wound vac on 7/7.  7/10 Had Ramp ECHO. Speed optimized. 7/13 OR to [facility] dressing change.  Back on Heparin drip.   Pt sitting up on side of the bed upon my arrival. Denies complaints.   Crestor and daptomycin stopped w/ elevated CK level, no muscle pain.  CK 1000 => 757 => 598=>125=>59 .   Back on statin. Now on vancomycin. Plan for 6 weeks Vancomycin per ID team.   Creatinine up to 2.01 this morning. BP has been stable. Pt reports he has good PO intake. Encouraged increase PO intake. He verbalized understanding.  Vital signs: Temp:  98.3 HR: 93 Doppler Pressure: 92  Automatic BP:  130/85 (93) O2 Sat: 95% on RA Wt: 171.7>174.6>178.6>182.9>181>182.3>182.7>186.7>184.5 lbs   LVAD interrogation reveals:  Speed: 5600 Flow:  4.0 Power:  4.1w PI: 4.9 Alarms: none Events: 17 PI events so far today; 7/16- 60+ Hct: 34  Fixed speed:  5600 Low speed limit:  5300  Drive Line:  Wound vac in place with 125 suction; no leaks noted; draining serosanguinous fluid. Anchor intact to drive line. Planned wound vac change Thursday, 06/27/22 per Dr. Donata Clay.   Labs:  LDH trend: 249>244>208>22>229>199>210>189>163>160  INR trend: 1.3 >1.3>1.2>1.1>1.1>1.1>1.1>1.2>1.3>1.5  Anticoagulation Plan: -INR Goal: 2.0 - 2.5 -ASA Dose: none  ICD: N/A  Drips:  Heparin - 500 units/hr  Infection:  06/07/22>> repeat drive line culture>> abundant MRSA; final  Blood Products: - 06/21/22>> 1 PRBC   Plan/Recommendations:  Page VAD coordinators for equipment or drive line issues Plan for drive line debridement in OR per Dr Donata Clay Thursday, 06/27/22. VAD coordinator will accompany pt.   Alyce Pagan RN VAD Coordinator  Office: [phone]  24/7 Pager: [phone]

## 2022-06-25 DIAGNOSIS — T827XXA Infection and inflammatory reaction due to other cardiac and vascular devices, implants and grafts, initial encounter: Secondary | ICD-10-CM | POA: Diagnosis not present

## 2022-06-25 DIAGNOSIS — Z95811 Presence of heart assist device: Secondary | ICD-10-CM | POA: Diagnosis not present

## 2022-06-25 LAB — BASIC METABOLIC PANEL
Anion gap: 9 (ref 5–15)
BUN: 31 mg/dL — ABNORMAL HIGH (ref 8–23)
CO2: 25 mmol/L (ref 22–32)
Calcium: 8.9 mg/dL (ref 8.9–10.3)
Chloride: 103 mmol/L (ref 98–111)
Creatinine, Ser: 1.39 mg/dL — ABNORMAL HIGH (ref 0.61–1.24)
GFR, Estimated: 58 mL/min — ABNORMAL LOW (ref >60)
Glucose, Bld: 132 mg/dL — ABNORMAL HIGH (ref 70–99)
Potassium: 4.2 mmol/L (ref 3.5–5.1)
Sodium: 137 mmol/L (ref 135–145)

## 2022-06-25 LAB — VANCOMYCIN, PEAK: Vancomycin Pk: 24 ug/mL — ABNORMAL LOW (ref 30–40)

## 2022-06-25 LAB — PROTIME-INR
INR: 1.6 — ABNORMAL HIGH (ref 0.8–1.2)
Prothrombin Time: 18.7 seconds — ABNORMAL HIGH (ref 11.4–15.2)

## 2022-06-25 LAB — GLUCOSE, CAPILLARY
Glucose-Capillary: 212 mg/dL — ABNORMAL HIGH (ref 70–99)
Glucose-Capillary: 195 mg/dL — ABNORMAL HIGH (ref 70–99)
Glucose-Capillary: 256 mg/dL — ABNORMAL HIGH (ref 70–99)
Glucose-Capillary: 168 mg/dL — ABNORMAL HIGH (ref 70–99)

## 2022-06-25 LAB — CBC
HCT: 33.4 % — ABNORMAL LOW (ref 39.0–52.0)
Hemoglobin: 10.7 g/dL — ABNORMAL LOW (ref 13.0–17.0)
MCH: 27.7 pg (ref 26.0–34.0)
MCHC: 32 g/dL (ref 30.0–36.0)
MCV: 86.5 fL (ref 80.0–100.0)
Platelets: 210 10*3/uL (ref 150–400)
RBC: 3.86 MIL/uL — ABNORMAL LOW (ref 4.22–5.81)
RDW: 18.6 % — ABNORMAL HIGH (ref 11.5–15.5)
WBC: 10.7 10*3/uL — ABNORMAL HIGH (ref 4.0–10.5)
nRBC: 0 % (ref 0.0–0.2)

## 2022-06-25 LAB — HEPARIN LEVEL (UNFRACTIONATED): Heparin Unfractionated: <0.1 IU/mL — ABNORMAL LOW (ref 0.30–0.70)

## 2022-06-25 LAB — LACTATE DEHYDROGENASE: LDH: 147 U/L (ref 98–192)

## 2022-06-25 MED ORDER — HYDRALAZINE HCL 25 MG PO TABS
37.5000 mg | ORAL_TABLET | Freq: Three times a day (TID) | ORAL | Status: DC
  Administered 2022-06-25 (×2): 37.5 mg via ORAL
  Filled 2022-06-25 (×2): qty 2

## 2022-06-25 MED ORDER — WARFARIN SODIUM 7.5 MG PO TABS
7.5000 mg | ORAL_TABLET | Freq: Once | ORAL | Status: AC
  Administered 2022-06-25: 7.5 mg via ORAL
  Filled 2022-06-25: qty 1

## 2022-06-25 NOTE — Progress Notes (Signed)
LVAD Coordinator Rounding Note:  Admitted 06/12/22 to Dr. Alford Highland service due to drive line debridement scheduled with Dr Donata Clay 06/14/22.   S/p driveline site debridement and wound vac on 7/7.  7/10 Had Ramp ECHO. Speed optimized. 7/13 OR to [facility] dressing change.  Back on Heparin drip.   Pt walking around in room this morning. Denies complaints.   Crestor and daptomycin stopped w/ elevated CK level, no muscle pain.  CK 1000 => 757 => 598=>125=>59 .   Back on statin. Now on vancomycin. Plan for 6 weeks Vancomycin per ID team.    Vital signs: Temp:  98.2 HR: 85 Doppler Pressure: 114 Automatic BP:  127/108 (115) O2 Sat: 95% on RA Wt: 171.7>174.6>178.6>182.9>181>182.3>182.7>186.7>184.5>184.3 lbs   LVAD interrogation reveals:  Speed: 5600 Flow:  4.4 Power:  4.1w PI: 3.6 Alarms: none Events: on batteries Hct: 34  Fixed speed:  5600 Low speed limit:  5300  Drive Line:  Wound vac in place with 125 suction; no leaks noted; draining serosanguinous fluid. Anchor intact to drive line. Planned wound vac change Thursday, 06/27/22 per Dr. Donata Clay.   Labs:  LDH trend: 249>244>208>22>229>199>210>189>163>160>147  INR trend: 1.3 >1.3>1.2>1.1>1.1>1.1>1.1>1.2>1.3>1.5>1.6  Anticoagulation Plan: -INR Goal: 2.0 - 2.5 -ASA Dose: none  ICD: N/A  Drips:  Heparin - 500 units/hr  Infection:  06/07/22>> repeat drive line culture>> abundant MRSA; final  Blood Products: - 06/21/22>> 2 PRBC   Plan/Recommendations:  Page VAD coordinators for equipment or drive line issues Plan for drive line debridement in OR per Dr Donata Clay Thursday, 06/27/22. VAD coordinator will accompany pt.   Hessie Diener RN VAD Coordinator  Office: [phone]  24/7 Pager: [phone]

## 2022-06-25 NOTE — Progress Notes (Addendum)
Pharmacy Antibiotic Note  Kevin Underwood is a 61 y.o. male admitted on 06/12/2022 with LVAD Driveline infection s/p debridement  7/7 and 7/13. Noted patient was on Daptomycin with CK increased up to 1000, pt transitioned back to vancomycin.   Scr jumped today from 1.44 to 2.01. CrCl ~ 40 ml/min.   7/18 AM update:  AUC therapeutic at 434 (goal 400-550) Noted bump in Scr yesterday morning  Plan: Continue Vancomycin 1000 mg IV every 24 hours  Watch Scr closely  Re-check levels with any further significant bump in Scr   Height: 5\' 10"  (177.8 cm) Weight: 83.7 kg (184 lb 8.4 oz) IBW/kg (Calculated) : 73  Temp (24hrs), Avg:98.1 F (36.7 C), Min:98 F (36.7 C), Max:98.3 F (36.8 C)  Recent Labs  Lab 06/18/22 1855 06/19/22 0340 06/20/22 0510 06/21/22 0500 06/22/22 0500 06/23/22 0705 06/24/22 0500 06/24/22 1948 06/25/22 0045  WBC  --    < > 10.2 12.3* 12.2* 10.1 10.9*  --   --   CREATININE  --    < > 1.48* 1.33* 1.41* 1.44* 2.01*  --   --   VANCOTROUGH 13*  --   --   --   --   --   --  12*  --   VANCOPEAK  --   --   --   --   --   --   --   --  24*   < > = values in this interval not displayed.     Estimated Creatinine Clearance: 39.8 mL/min (A) (by C-G formula based on SCr of 2.01 mg/dL (H)).    No Known Allergies   06/27/22, PharmD, BCPS Clinical Pharmacist Phone: [phone]

## 2022-06-25 NOTE — Progress Notes (Signed)
5 Days Post-Op Procedure(s) (LRB): VAD POWERCORD TUNNEL WOUND VAC CHANGE (N/A) Subjective: Patient denies any pain or tenderness in the power cord tunnel. Wound VAC continues to absorb about 100 cc daily of serosanguineous fluid and there have been no air leaks in the system. Plan return to the OR on Thursday for wound exploration and assessment whether to continue wound VAC therapy. INR now increasing by one-point daily. Objective: Vital signs in last 24 hours: Temp:  [98 F (36.7 C)-98.3 F (36.8 C)] 98.1 F (36.7 C) (07/18 1639) Pulse Rate:  [77-85] 77 (07/18 1639) Cardiac Rhythm: Normal sinus rhythm (07/18 0701) Resp:  [12-18] 17 (07/18 1639) BP: (113-133)/(86-108) 120/99 (07/18 1639) SpO2:  [95 %-98 %] 98 % (07/18 1639) Weight:  [83.6 kg] 83.6 kg (07/18 0500)  Hemodynamic parameters for last 24 hours: Stable  Intake/Output from previous day: 07/17 0701 - 07/18 0700 In: 1327 [P.O.:960; I.V.:167; IV Piggyback:200] Out: 2100 [Urine:2050; Drains:50] Intake/Output this shift: No intake/output data recorded.  Blood pressure (!) 120/99, pulse 77, temperature 98.1 F (36.7 C), temperature source Oral, resp. rate 17, height 5\' 10"  (1.778 m), weight 83.6 kg, SpO2 98 %.   Lab Results: Recent Labs    06/24/22 0500 06/25/22 0500  WBC 10.9* 10.7*  HGB 10.7* 10.7*  HCT 33.7* 33.4*  PLT 235 210   BMET:  Recent Labs    06/24/22 0500 06/25/22 0500  NA 138 137  K 4.3 4.2  CL 102 103  CO2 26 25  GLUCOSE 167* 132*  BUN 31* 31*  CREATININE 2.01* 1.39*  CALCIUM 8.9 8.9    PT/INR:  Recent Labs    06/25/22 0500  LABPROT 18.7*  INR 1.6*   ABG    Component Value Date/Time   PHART 7.508 (H) 11/20/2020 0438   HCO3 24.8 09/09/2021 0313   TCO2 29 11/20/2020 0438   ACIDBASEDEF 6.0 (H) 11/24/2019 1718   O2SAT 63.4 09/09/2021 0313   CBG (last 3)  Recent Labs    06/25/22 0603 06/25/22 1159 06/25/22 1637  GLUCAP 212* 195* 256*    Assessment/Plan: S/P Procedure(s)  (LRB): VAD POWERCORD TUNNEL WOUND VAC CHANGE (N/A) Continue IV vancomycin for MRSA Continue wound VAC therapy Return to the OR Thursday, time to be determined tomorrow   LOS: 13 days    Monday 06/25/2022
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
## 2022-06-25 NOTE — Progress Notes (Signed)
    [facility]   Reason for visit: Follow up on driveline infection  Interval History: no new positive cultures  Physical Exam: Constitutional:  Vitals:   06/25/22 0450 06/25/22 0732  BP: 117/86 (!) 127/108  Pulse: 85   Resp: 12 18  Temp: 98 F (36.7 C) 98.2 F (36.8 C)  SpO2: 96% 95%   patient appears in NAD  Impression: driveline infection; plan for debridement on 7/20 with VAC change  Plan: 1.  No changes, continue with vancomycin

## 2022-06-25 NOTE — Progress Notes (Signed)
ANTICOAGULATION CONSULT NOTE  Pharmacy Consult for heparin + warfarin Indication:  LVAD  No Known Allergies  Patient Measurements: Height: 5\' 10"  (177.8 cm) Weight: 83.6 kg (184 lb 4.9 oz) IBW/kg (Calculated) : 73 Heparin Dosing Weight: 80kg  Vital Signs: Temp: 98.2 F (36.8 C) (07/18 0732) Temp Source: Oral (07/18 0732) BP: 127/108 (07/18 0732) Pulse Rate: 85 (07/18 0450)  Labs: Recent Labs    06/23/22 0500 06/23/22 0705 06/23/22 0705 06/24/22 0500 06/25/22 0500  HGB  --  10.2*   < > 10.7* 10.7*  HCT  --  32.0*  --  33.7* 33.4*  PLT  --  229  --  235 210  LABPROT  --  16.9*  --  17.6* 18.7*  INR  --  1.4*  --  1.5* 1.6*  HEPARINUNFRC <0.10*  --   --  <0.10* <0.10*  CREATININE  --  1.44*  --  2.01* 1.39*   < > = values in this interval not displayed.     Estimated Creatinine Clearance: 57.6 mL/min (A) (by C-G formula based on SCr of 1.39 mg/dL (H)).   Medical History: Past Medical History:  Diagnosis Date   "    Arthritis    CAD (coronary artery disease)    a. s/p CABG in 11/2019 with LIMA-LAD, SVG-OM1, SVG-PDA and SVG-D1   CHF (congestive heart failure) (HCC)    a. EF < 20% by echo in 11/2019   Essential hypertension    PAF (paroxysmal atrial fibrillation) (HCC)    Type 2 diabetes mellitus (HCC)     Assessment: 73 yoM with HM3 LVAD admitted with ongoing LVAD driveline infection. Pt with planned debridements in OR. Pt is on warfarin PTA with last dose taken 7/3.   Pt s/p OR for infected wound debridement 7/7 and 7/13. Per Dr. 8/13 - ok to continue warfarin with INR < / = 2.2 for procedures. Next debridement planned for 7/20 CBC and LDH stable. INR 1.6 today, heparin level undetectable as expected with low-dosing heparin drip rate 500 uts/hr and not titrating. CBC stable with no S/Sx bleeding and minimal drainage in vac.  *Home warfarin dose 6mg  MWF, 4mg  AODs  Goal of Therapy:  INR goal 2-2.5 Heparin level < 0.3 units/ml Monitor platelets by  anticoagulation protocol: Yes   Plan:  -Warfarin 7.5 mg x 1  repeat tonight -Continue heparin at 500 units/h -Daily INR, heparin level, CBC, LDH -Plan to return to OR again on Thursday 7/20    Pharm.D. CPP, BCPS Clinical Pharmacist [phone] 06/25/2022 9:04 AM

## 2022-06-25 NOTE — Progress Notes (Addendum)
Patient ID: Kevin Underwood, male   DOB: 03-01-1961, 61 y.o.   MRN: [id number]   Advanced Heart Failure VAD Team Note  PCP-Cardiologist: Nona Dell, MD   Subjective:    S/p driveline site debridement and wound vac on 7/7.  7/10 Had Ramp ECHO. Speed optimized. 7/13 OR to [facility] dressing change.  Back on Heparin drip.   Crestor and daptomycin stopped w/ elevated CK level, no muscle pain.  CK 1000 => 757 => 598=>125=>59 .   Back on statin. Now on vancomycin.    SCr stable, 1.33 -> 1.41 -> 1.44 -> 2.01 -> 1.39  MAPs 100s-110s  On heparin and warfarin. INR 1.6. No bleeding Hgb 10.7.   Notes back pain which is a chronic problem, otherwise no complaints. Getting ready to walk the halls.   LVAD INTERROGATION:  HeartMate III LVAD:   Flow 4  liters/min, speed 5600, power 4, PI 4.7. VAD interrogated personally. Parameters stable.   Objective:    Vital Signs:   Temp:  [98 F (36.7 C)-98.2 F (36.8 C)] 98.2 F (36.8 C) (07/18 0732) Pulse Rate:  [79-85] 85 (07/18 0450) Resp:  [12-19] 18 (07/18 0732) BP: (117-137)/(86-113) 127/108 (07/18 0732) SpO2:  [95 %-98 %] 95 % (07/18 0732) Weight:  [83.6 kg] 83.6 kg (07/18 0500) Last BM Date : 06/24/22 Mean arterial Pressure  100s-110s Intake/Output:   Intake/Output Summary (Last 24 hours) at 06/25/2022 0819 Last data filed at 06/25/2022 0738 Gross per 24 hour  Intake 1326.99 ml  Output 1900 ml  Net -573.01 ml     Physical Exam   Physical Exam: GENERAL: Well appearing. Sitting up on side of bed HEENT: normal  NECK: Supple, No JVD.  2+ bilaterally, no bruits.   CARDIAC:  Mechanical heart sounds with LVAD hum present.  LUNGS:  Clear to auscultation bilaterally.  ABDOMEN:  Soft, round, nontender, positive bowel sounds x4.     LVAD exit site: VAC present EXTREMITIES:  Warm and dry, no cyanosis, clubbing, rash or edema  NEUROLOGIC:  Alert and oriented x 4.  Gait steady.  No aphasia.  No dysarthria.  Affect pleasant.             Telemetry    SR 70s, rare PVCs  Labs   Basic Metabolic Panel: Recent Labs  Lab 06/21/22 0500 06/22/22 0500 06/23/22 0705 06/24/22 0500 06/25/22 0500  NA 135 138 137 138 137  K 4.3 4.0 4.2 4.3 4.2  CL 101 104 101 102 103  CO2 24 25 25 26 25   GLUCOSE 121* 63* 152* 167* 132*  BUN 31* 34* 31* 31* 31*  CREATININE 1.33* 1.41* 1.44* 2.01* 1.39*  CALCIUM 8.6* 8.9 8.7* 8.9 8.9    Liver Function Tests: No results for input(s): "AST", "ALT", "ALKPHOS", "BILITOT", "PROT", "ALBUMIN" in the last 168 hours.  No results for input(s): "LIPASE", "AMYLASE" in the last 168 hours. No results for input(s): "AMMONIA" in the last 168 hours.  CBC: Recent Labs  Lab 06/21/22 0500 06/21/22 2014 06/22/22 0500 06/23/22 0705 06/24/22 0500 06/25/22 0500  WBC 12.3*  --  12.2* 10.1 10.9* 10.7*  HGB 7.5* 9.7* 9.9* 10.2* 10.7* 10.7*  HCT 23.6* 29.2* 29.4* 32.0* 33.7* 33.4*  MCV 84.9  --  83.8 86.0 87.1 86.5  PLT 252  --  236 229 235 210    INR: Recent Labs  Lab 06/21/22 0500 06/22/22 0500 06/23/22 0705 06/24/22 0500 06/25/22 0500  INR 1.3* 1.3* 1.4* 1.5* 1.6*    Other results: EKG:  Imaging   No results found.   Medications:     Scheduled Medications:  acetaminophen  1,000 mg Oral Once   allopurinol  100 mg Oral Daily   Chlorhexidine Gluconate Cloth  6 each Topical Daily   docusate sodium  200 mg Oral BID   gabapentin  600 mg Oral BID   guaiFENesin  600 mg Oral BID   hydrALAZINE  25 mg Oral TID   insulin aspart  0-15 Units Subcutaneous TID WC   insulin aspart  0-5 Units Subcutaneous QHS   insulin detemir  15 Units Subcutaneous QHS   lactose free nutrition  237 mL Oral TID WC   loratadine  10 mg Oral Daily   methimazole  20 mg Oral BID   metoprolol succinate  25 mg Oral Daily   pantoprazole  40 mg Oral Daily   polyethylene glycol  17 g Oral Daily   rosuvastatin  20 mg Oral Daily   sertraline  50 mg Oral Daily   tamsulosin  0.4 mg Oral Daily   traZODone   150 mg Oral QHS   Warfarin - Pharmacist Dosing Inpatient   Does not apply q1600    Infusions:  heparin 500 Units/hr (06/25/22 0630)   vancomycin Stopped (06/24/22 2156)    PRN Medications: acetaminophen, albuterol, morphine injection, ondansetron (ZOFRAN) IV, mouth rinse, sorbitol, traMADol   Patient Profile   62 y.o. male with history of HM III LVAD, chronic systolic CHF/ICM, COPD, PAF. 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   Readmitted for driveline infection and surgical debridement/possible wound vac placement.  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 S/p driveline site debridement 6/8. Surgical wound cultures grew MRSA, repeat debridement 6/23. Driveline debridement with wound vac placement on 7/7.  - Transitioned from daptomycin to vancomycin due to elevated CK.  CK trending down, so far no problems with vancomycin. Vancomycin planned x 6 wks. Scr trending up today. See below. - 7/13 VAC dressing change.  - Vac working well  - Plan to return to OR on 07/20 for vac change - Continue heparin drip and warfarin. INR 1.6. Discussed dosing with PharmD personally. 2.  Chronic systolic CHF: Echo 10/21 with EF 20-25%, mildly decreased RV function. LHC/RHC in 12/21 with patent grafts, low output. Suspect mixed ischemic/nonischemic cardiomyopathy (prior heavy ETOH and drugs as well as CAD).  No ETOH, drugs, smoking since CABG in 12/20. Admitted with cardiogenic shock in 12/21, had placement of Impella 5.5 initially, now s/p Heartmate 3 LVAD on 11/17/20.  Ramp echo 2/23 with speed decreased to 5500 rpm. He would eventually like heart transplant. NYHA class II. Have been allowing MAP to run higher due to recent AKI thought to be due to lowering BP too much in setting of right renal artery stenosis.  - Had Ramp ECHO with speed optimized.  MAP 100s-110s. INR stable at 1.6 today on warfarin.+ heparin drip.  LDH  stable 160 - Volume status ok - MAPs 100s-110s today. Increase hydralazine to 37.5 mg TID. Watch MAPs closely with renal artery stenosis. - Continue Toprol XL 25 mg daily.  - Continue warfarin/low dose heparin.  3. AKI: Creatinine up to 4.6 last admission, initially thought this was ATN from low BP but he was never markedly hypotensive and LFTs without any evidence of shock liver. ? Vanc toxicity (troughs were ok). Taken off Entresto and hydralazine. Volume repleted with IVF. In reviewing prior CTA abdomen/pelvis, he has high-grade stenosis  of proximal right renal artery. D/w Renal and feel like he may need higher perfusion pressures. Creatinine down to 1.3-1.4 range - Scr back up to 2.01 yesterday, but back to baseline 1.4 this am. MAPs elevated 100s-110s. Increase hydralazine but watch MAPs. - Would consider reassessment of renal artery stenosis and possible treatment once creatinine has improved to baseline.  4.  CAD: S/p CABG 12/20.  LHC pre-VAD with patent grafts, no target for intervention. - Stopped statin with elevated CK => this was in setting of daptomycin.  -CK down to 125 , statin restarted.  CK down further to 59.  5.Atrial fibrillation: Paroxysmal.  S/p Maze and LA appendage clip with CABG in 12/20. IIn NSR today - Continue Toprol XL 25 mg daily.  - Off amiodarone with hyperthyroidism. - On Warfarin + heparin.  6.  Type 2 diabetes: Takes glipizide at home.  - SSI  - Continue Levemir 15 units qHS - Diabetes coordinator following 7. 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  8. 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  He missed his endocrinology appt. Has been rescheduled for July 27th. - He is now off amiodarone.   - Continue methimazole 20 mg bid, TSH was mildly elevated.   9.  Gout: On allopurinol.  10. Anemia (chronic blood loss):  - Got 1u RBCs on 7/12 Hgb 7.5-> 9.3 -> 10.2 -> 10.7 today. No obvious source of bleeding    TOC  CM/SW to coordinate HH, IV antibiotics and wound vac.   Plan to return to the OR 07/20.   I reviewed the LVAD parameters from today, and compared the results to the patient's prior recorded data.  No programming changes were made.  The LVAD is functioning within specified parameters.  The patient performs LVAD self-test daily.  LVAD interrogation was negative for any significant power changes, alarms or PI events/speed drops.  LVAD equipment check completed and is in good working order.  Back-up equipment present.   LVAD education done on emergency procedures and precautions and reviewed exit site care.  Length of Stay: [address] Dalbert Garnet, PA-C 06/25/2022, 8:19 AM  VAD Team --- VAD ISSUES ONLY--- Pager [phone] (7am - 7am)  Advanced Heart Failure Team  Pager [phone] (M-F; 7a - 5p)  Please contact [facility] for night-coverage after hours (5p -7a ) and weekends on [email or web address]  Patient seen and examined with the above-signed Advanced Practice Provider and/or Housestaff. I personally reviewed laboratory data, imaging studies and relevant notes. I independently examined the patient and formulated the important aspects of the plan. I have edited the note to reflect any of my changes or salient points. I have personally discussed the plan with the patient and/or family.  Complaining of some back pain which is chronic. On IV abx. No f/c. Wound vac looks good. On heparin/warfarin. INR 1.6  Renal function back to baseline  General:  NAD.  HEENT: normal  Neck: supple. JVP not elevated.  Carotids 2+ bilat; no bruits. No lymphadenopathy or thryomegaly appreciated. Cor: LVAD hum.  Lungs: Clear. Abdomen: obese soft, nontender, non-distended. No hepatosplenomegaly. No bruits or masses. Good bowel sounds. Wound vac ok. Anchor in place.  Extremities: no cyanosis, clubbing, rash. Warm no edema  Neuro: alert & oriented x 3. No focal deficits. Moves all 4 without problem   Continue IV abx. Wound vac  working well. Continue heparin/warfarin. Keep INR 1.3-1.6 with need for OR on Thursday. Discussed dosing with PharmD personally. Renal function improved.  VAD interrogated personally. Parameters stable.  Glori Bickers, MD  9:19 AM

## 2022-06-26 DIAGNOSIS — T827XXA Infection and inflammatory reaction due to other cardiac and vascular devices, implants and grafts, initial encounter: Secondary | ICD-10-CM | POA: Diagnosis not present

## 2022-06-26 DIAGNOSIS — Z95811 Presence of heart assist device: Secondary | ICD-10-CM | POA: Diagnosis not present

## 2022-06-26 LAB — GLUCOSE, CAPILLARY
Glucose-Capillary: 127 mg/dL — ABNORMAL HIGH (ref 70–99)
Glucose-Capillary: 153 mg/dL — ABNORMAL HIGH (ref 70–99)
Glucose-Capillary: 204 mg/dL — ABNORMAL HIGH (ref 70–99)
Glucose-Capillary: 209 mg/dL — ABNORMAL HIGH (ref 70–99)

## 2022-06-26 LAB — HEPARIN LEVEL (UNFRACTIONATED): Heparin Unfractionated: <0.1 IU/mL — ABNORMAL LOW (ref 0.30–0.70)

## 2022-06-26 LAB — CBC
HCT: 32.3 % — ABNORMAL LOW (ref 39.0–52.0)
Hemoglobin: 10.7 g/dL — ABNORMAL LOW (ref 13.0–17.0)
MCH: 28.5 pg (ref 26.0–34.0)
MCHC: 33.1 g/dL (ref 30.0–36.0)
MCV: 86.1 fL (ref 80.0–100.0)
Platelets: 220 10*3/uL (ref 150–400)
RBC: 3.75 MIL/uL — ABNORMAL LOW (ref 4.22–5.81)
RDW: 18.6 % — ABNORMAL HIGH (ref 11.5–15.5)
WBC: 11.2 10*3/uL — ABNORMAL HIGH (ref 4.0–10.5)
nRBC: 0 % (ref 0.0–0.2)

## 2022-06-26 LAB — TYPE AND SCREEN
ABO/RH(D): B POS
Antibody Screen: NEGATIVE

## 2022-06-26 LAB — BASIC METABOLIC PANEL
Anion gap: 8 (ref 5–15)
BUN: 35 mg/dL — ABNORMAL HIGH (ref 8–23)
CO2: 26 mmol/L (ref 22–32)
Calcium: 8.7 mg/dL — ABNORMAL LOW (ref 8.9–10.3)
Chloride: 104 mmol/L (ref 98–111)
Creatinine, Ser: 1.51 mg/dL — ABNORMAL HIGH (ref 0.61–1.24)
GFR, Estimated: 52 mL/min — ABNORMAL LOW (ref >60)
Glucose, Bld: 153 mg/dL — ABNORMAL HIGH (ref 70–99)
Potassium: 4.2 mmol/L (ref 3.5–5.1)
Sodium: 138 mmol/L (ref 135–145)

## 2022-06-26 LAB — PROTIME-INR
INR: 1.7 — ABNORMAL HIGH (ref 0.8–1.2)
Prothrombin Time: 19.5 seconds — ABNORMAL HIGH (ref 11.4–15.2)

## 2022-06-26 LAB — LACTATE DEHYDROGENASE: LDH: 157 U/L (ref 98–192)

## 2022-06-26 MED ORDER — HYDRALAZINE HCL 25 MG PO TABS
25.0000 mg | ORAL_TABLET | Freq: Three times a day (TID) | ORAL | Status: DC
  Administered 2022-06-26 (×2): 25 mg via ORAL
  Filled 2022-06-26 (×3): qty 1

## 2022-06-26 MED ORDER — CEFAZOLIN SODIUM-DEXTROSE 2-4 GM/100ML-% IV SOLN
2.0000 g | INTRAVENOUS | Status: AC
  Administered 2022-06-27: 2 g via INTRAVENOUS
  Filled 2022-06-26: qty 100

## 2022-06-26 NOTE — Progress Notes (Signed)
Patient ID: Kevin Underwood, male   DOB: 1961/07/21, 61 y.o.   MRN: [id number]   Advanced Heart Failure VAD Team Note  PCP-Cardiologist: Nona Dell, MD   Subjective:    S/p driveline site debridement and wound vac on 7/7.  7/10 Had Ramp ECHO. Speed optimized. 7/13 OR to [facility] dressing change.  Back on Heparin drip.   Feels ok. Wound vac still draining ~ 100cc/hr. Afebrile.   On heparin/warfarin INR 1.7    SCr up slightly at 1.5   Denies CP or SOB.    LVAD INTERROGATION:  HeartMate III LVAD:   Flow 4.3  liters/min, speed 5600, power 4.0, PI 2.8. VAD interrogated personally. Parameters stable.  Objective:    Vital Signs:   Temp:  [97.5 F (36.4 C)-98.7 F (37.1 C)] 97.8 F (36.6 C) (07/19 0748) Pulse Rate:  [71-84] 84 (07/19 0355) Resp:  [14-18] 15 (07/19 0748) BP: (81-121)/(68-99) 104/87 (07/19 0748) SpO2:  [95 %-98 %] 97 % (07/19 0748) Weight:  [84.7 kg] 84.7 kg (07/19 0500) Last BM Date : 06/25/22 Mean arterial Pressure  90-100 Intake/Output:   Intake/Output Summary (Last 24 hours) at 06/26/2022 0822 Last data filed at 06/26/2022 0810 Gross per 24 hour  Intake 805.87 ml  Output 1475 ml  Net -669.13 ml      Physical Exam   General:  NAD.  HEENT: normal  Neck: supple. JVP not elevated.  Carotids 2+ bilat; no bruits. No lymphadenopathy or thryomegaly appreciated. Cor: LVAD hum.  Lungs: Clear. Abdomen: obese soft, nontender, non-distended. No hepatosplenomegaly. No bruits or masses. Good bowel sounds. Wound vac looks good.  Extremities: no cyanosis, clubbing, rash. Warm no edema  Neuro: alert & oriented x 3. No focal deficits. Moves all 4 without problem    Telemetry    SR 80s Personally reviewed  Labs   Basic Metabolic Panel: Recent Labs  Lab 06/22/22 0500 06/23/22 0705 06/24/22 0500 06/25/22 0500 06/26/22 0400  NA 138 137 138 137 138  K 4.0 4.2 4.3 4.2 4.2  CL 104 101 102 103 104  CO2 25 25 26 25 26   GLUCOSE 63* 152* 167* 132* 153*  BUN  34* 31* 31* 31* 35*  CREATININE 1.41* 1.44* 2.01* 1.39* 1.51*  CALCIUM 8.9 8.7* 8.9 8.9 8.7*     Liver Function Tests: No results for input(s): "AST", "ALT", "ALKPHOS", "BILITOT", "PROT", "ALBUMIN" in the last 168 hours.  No results for input(s): "LIPASE", "AMYLASE" in the last 168 hours. No results for input(s): "AMMONIA" in the last 168 hours.  CBC: Recent Labs  Lab 06/22/22 0500 06/23/22 0705 06/24/22 0500 06/25/22 0500 06/26/22 0400  WBC 12.2* 10.1 10.9* 10.7* 11.2*  HGB 9.9* 10.2* 10.7* 10.7* 10.7*  HCT 29.4* 32.0* 33.7* 33.4* 32.3*  MCV 83.8 86.0 87.1 86.5 86.1  PLT 236 229 235 210 220     INR: Recent Labs  Lab 06/22/22 0500 06/23/22 0705 06/24/22 0500 06/25/22 0500 06/26/22 0400  INR 1.3* 1.4* 1.5* 1.6* 1.7*     Other results:   Imaging   No results found.   Medications:     Scheduled Medications:  acetaminophen  1,000 mg Oral Once   allopurinol  100 mg Oral Daily   Chlorhexidine Gluconate Cloth  6 each Topical Daily   docusate sodium  200 mg Oral BID   gabapentin  600 mg Oral BID   guaiFENesin  600 mg Oral BID   hydrALAZINE  37.5 mg Oral TID   insulin aspart  0-15 Units Subcutaneous  TID WC   insulin aspart  0-5 Units Subcutaneous QHS   insulin detemir  15 Units Subcutaneous QHS   lactose free nutrition  237 mL Oral TID WC   loratadine  10 mg Oral Daily   methimazole  20 mg Oral BID   metoprolol succinate  25 mg Oral Daily   pantoprazole  40 mg Oral Daily   polyethylene glycol  17 g Oral Daily   rosuvastatin  20 mg Oral Daily   sertraline  50 mg Oral Daily   tamsulosin  0.4 mg Oral Daily   traZODone  150 mg Oral QHS   Warfarin - Pharmacist Dosing Inpatient   Does not apply q1600    Infusions:  heparin 500 Units/hr (06/26/22 0400)   vancomycin Stopped (06/25/22 2214)    PRN Medications: acetaminophen, albuterol, morphine injection, ondansetron (ZOFRAN) IV, mouth rinse, sorbitol, traMADol   Patient Profile   61 y.o. male with  history of HM III LVAD, chronic systolic CHF/ICM, COPD, PAF. 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   Readmitted for driveline infection and surgical debridement/possible wound vac placement.  Assessment/Plan:    1. Driveline infection: Recent driveline infection [id number]. S/p driveline site debridement 6/8. Surgical wound cultures grew MRSA, repeat debridement 6/23. Driveline debridement with wound vac placement on 7/7.  - Transitioned from daptomycin to vancomycin due to elevated CK.  Vancomycin planned x 6 wks.  - 7/13 VAC dressing change.  - Vac working well  - Plan to return to OR tomorrow for vac change - Continue heparin drip and warfarin. INR 1.7. Discussed dosing with PharmD personally. Hold warfarin today 2.  Chronic systolic CHF: Echo [id number] with EF 20-25%, mildly decreased RV function. LHC/RHC in 12/21 with patent grafts, low output. Suspect mixed ischemic/nonischemic cardiomyopathy (prior heavy ETOH and drugs as well as CAD).  No ETOH, drugs, smoking since CABG in 12/20. Admitted with cardiogenic shock in 12/21, had placement of Impella 5.5 initially, now s/p Heartmate 3 LVAD on 11/17/20.  Ramp echo 2/23 with speed decreased to 5500 rpm. He would eventually like heart transplant. NYHA class II. Have been allowing MAP to run higher due to recent AKI thought to be due to lowering BP too much in setting of right renal artery stenosis.  - Had Ramp ECHO with speed optimized.  MAP 90s. INR 1.7 today on warfarin.+ heparin drip.  LDH stable - Volume status ok - MAPs 90s today. Would not bump hydral keep at 25 tid. Watch MAPs closely with renal artery stenosis. - Continue Toprol XL 25 mg daily.  - Continue warfarin/low dose heparin.  3. AKI: Creatinine up to 4.6 last admission, initially thought this was ATN from low BP but he was never markedly hypotensive and LFTs without any evidence of shock liver. ? Vanc toxicity (troughs were ok). Taken off Entresto  and hydralazine. Volume repleted with IVF. In reviewing prior CTA abdomen/pelvis, he has high-grade stenosis of proximal right renal artery. D/w Renal and feel like he may need higher perfusion pressures. SCr 1.4-> 1.5 Let MAPs run 90-100 - Would consider reassessment of renal artery stenosis and possible treatment once creatinine has improved to baseline.  4.  CAD: S/p CABG 12/20.  LHC pre-VAD with patent grafts, no target for intervention. - Stopped statin with elevated CK => this was in setting of daptomycin.  -CK dow , statin restarted.  5.Atrial fibrillation: Paroxysmal.  S/p Maze and LA appendage clip with CABG in 12/20. In NSR today -  Continue Toprol XL 25 mg daily.  - Off amiodarone with hyperthyroidism. - On Warfarin + heparin.  6.  Type 2 diabetes: Takes glipizide at home.  - SSI  - Continue Levemir 15 units qHS - Diabetes coordinator following 7. 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  8. 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  He missed his endocrinology appt. Has been rescheduled for July 27th. - He is now off amiodarone.   - Continue methimazole 20 mg bid, TSH was mildly elevated.   9.  Gout: On allopurinol.  10. Anemia (chronic blood loss):  - Got 1u RBCs on 7/12 Hgb stable at 10.7 today No obvious source of bleeding   Plan to return to the OR tomorrow    I reviewed the LVAD parameters from today, and compared the results to the patient's prior recorded data.  No programming changes were made.  The LVAD is functioning within specified parameters.  The patient performs LVAD self-test daily.  LVAD interrogation was negative for any significant power changes, alarms or PI events/speed drops.  LVAD equipment check completed and is in good working order.  Back-up equipment present.   LVAD education done on emergency procedures and precautions and reviewed exit site care.  Length of Stay: 14  Arvilla Meres, MD 06/26/2022, 8:22  AM  VAD Team --- VAD ISSUES ONLY--- Pager [phone] (7am - 7am)  Advanced Heart Failure Team  Pager [phone] (M-F; 7a - 5p)  Please contact [facility] for night-coverage after hours (5p -7a ) and weekends on [email or web address]

## 2022-06-26 NOTE — Progress Notes (Signed)
LVAD Coordinator Rounding Note:  Admitted 06/12/22 to Dr. Alford Highland service due to drive line debridement scheduled with Dr Donata Clay 06/14/22.   S/p driveline site debridement and wound vac on 7/7.  7/10 Had Ramp ECHO. Speed optimized. 7/13 OR to [facility] dressing change.  Back on Heparin drip.   Pt sitting up in bed watching TV this am. Denies complaints.  Dr. Donata Clay in room to assess patient. OR case moved to 9:00 am tomorrow morning.   Crestor and daptomycin stopped w/ elevated CK level, no muscle pain.  CK 1000 => 757 => 598=>125=>59 .   Back on statin. Now on vancomycin. Plan for 6 weeks Vancomycin per ID team.    Vital signs: Temp:  97.8 HR: 84 Doppler Pressure: 92 Automatic BP:  104/87 O2 Sat: 97% on RA Wt: 171.7>174.6>178.6>182.9>181>182.3>182.7>186.7>184.5>184.3>186.7 lbs   LVAD interrogation reveals:  Speed: 5600 Flow:  4.4 Power:  4.2w PI: 2.9 Alarms: none Events: 75 PI events 06/25/22 Hct: 34  Fixed speed:  5600 Low speed limit:  5300  Drive Line:  Wound vac in place with 125 suction; no leaks noted; draining serosanguinous fluid. Anchor intact to drive line. Planned wound vac change Thursday, 06/27/22 per Dr. Donata Clay.   Labs:  LDH trend: 249>244>208>22>229>199>210>189>163>160>147>157  INR trend: 1.3 >1.3>1.2>1.1>1.1>1.1>1.1>1.2>1.3>1.5>1.6>1.7  Anticoagulation Plan: -INR Goal: 2.0 - 2.5 -ASA Dose: none  ICD: N/A  Drips:  Heparin - 500 units/hr  Infection:  06/07/22>> repeat drive line culture>> abundant MRSA; final  Blood Products: - 06/21/22>> 2 PRBC   Plan/Recommendations:  Page VAD coordinators for equipment or drive line issues Plan for drive line debridement in OR per Dr Donata Clay Thursday, 06/27/22. VAD coordinator will accompany pt.   Hessie Diener RN VAD Coordinator  Office: [phone]  24/7 Pager: [phone]

## 2022-06-26 NOTE — Progress Notes (Signed)
ANTICOAGULATION CONSULT NOTE  Pharmacy Consult for heparin + warfarin Indication:  LVAD  No Known Allergies  Patient Measurements: Height: 5\' 10"  (177.8 cm) Weight: 84.7 kg (186 lb 11.7 oz) IBW/kg (Calculated) : 73 Heparin Dosing Weight: 80kg  Vital Signs: Temp: 97.8 F (36.6 C) (07/19 0748) Temp Source: Oral (07/19 0748) BP: 104/87 (07/19 0748) Pulse Rate: 84 (07/19 0355)  Labs: Recent Labs    06/24/22 0500 06/25/22 0500 06/26/22 0400  HGB 10.7* 10.7* 10.7*  HCT 33.7* 33.4* 32.3*  PLT 235 210 220  LABPROT 17.6* 18.7* 19.5*  INR 1.5* 1.6* 1.7*  HEPARINUNFRC <0.10* <0.10* <0.10*  CREATININE 2.01* 1.39* 1.51*     Estimated Creatinine Clearance: 53 mL/min (A) (by C-G formula based on SCr of 1.51 mg/dL (H)).   Medical History: Past Medical History:  Diagnosis Date   "    Arthritis    CAD (coronary artery disease)    a. s/p CABG in 11/2019 with LIMA-LAD, SVG-OM1, SVG-PDA and SVG-D1   CHF (congestive heart failure) (HCC)    a. EF < 20% by echo in 11/2019   Essential hypertension    PAF (paroxysmal atrial fibrillation) (HCC)    Type 2 diabetes mellitus (HCC)     Assessment: 56 yoM with HM3 LVAD admitted with ongoing LVAD driveline infection. Pt with planned debridements in OR. Pt is on warfarin PTA with last dose taken 7/3.   Pt s/p OR for infected wound debridement 7/7 and 7/13. Next debridement planned for tomorrow 7/20 INR 1.7 today, heparin level undetectable as expected with low-dosing heparin drip rate 500 uts/hr and not titrating. CBC and LDH stable.   Given INR trend and OR planned for tomorrow will hold warfarin tonight.   *Home warfarin dose 6mg  MWF, 4mg  AODs  Goal of Therapy:  INR goal 2-2.5 Heparin level < 0.3 units/ml Monitor platelets by anticoagulation protocol: Yes   Plan:  -Hold warfarin tonight in anticipation of OR 7/20  -Continue heparin at 500 units/h -Daily INR, heparin level, CBC, LDH  PharmD., BCPS Clinical  Pharmacist 06/26/2022 8:24 AM

## 2022-06-26 NOTE — Progress Notes (Signed)
6 Days Post-Op Procedure(s) (LRB): VAD POWERCORD TUNNEL WOUND VAC CHANGE (N/A) Subjective: No complaints, ready for return to OR for wound debridement and possible VAC change Wound VAC functioning without alarms INR up to 1.7 Objective: Vital signs in last 24 hours: Temp:  [97.5 F (36.4 C)-98.7 F (37.1 C)] 97.8 F (36.6 C) (07/19 0748) Pulse Rate:  [71-84] 84 (07/19 0355) Cardiac Rhythm: Atrial fibrillation (07/19 0725) Resp:  [14-18] 15 (07/19 0748) BP: (81-121)/(68-99) 104/87 (07/19 0748) SpO2:  [95 %-98 %] 97 % (07/19 0748) Weight:  [84.7 kg] 84.7 kg (07/19 0500)  Hemodynamic parameters for last 24 hours:  stable  Intake/Output from previous day: 07/18 0701 - 07/19 0700 In: 1027.5 [P.O.:720; I.V.:107.5; IV Piggyback:200] Out: 1475 [Urine:1425; Drains:50] Intake/Output this shift: Total I/O In: 18.4 [I.V.:18.4] Out: 0   EXAM  Nsr Wound vac sponge compressed No tenderness over upper abdomen Normal VAD hum  Lab Results: Recent Labs    06/25/22 0500 06/26/22 0400  WBC 10.7* 11.2*  HGB 10.7* 10.7*  HCT 33.4* 32.3*  PLT 210 220   BMET:  Recent Labs    06/25/22 0500 06/26/22 0400  NA 137 138  K 4.2 4.2  CL 103 104  CO2 25 26  GLUCOSE 132* 153*  BUN 31* 35*  CREATININE 1.39* 1.51*  CALCIUM 8.9 8.7*    PT/INR:  Recent Labs    06/26/22 0400  LABPROT 19.5*  INR 1.7*   ABG    Component Value Date/Time   PHART 7.508 (H) 11/20/2020 0438   HCO3 24.8 09/09/2021 0313   TCO2 29 11/20/2020 0438   ACIDBASEDEF 6.0 (H) 11/24/2019 1718   O2SAT 63.4 09/09/2021 0313   CBG (last 3)  Recent Labs    06/25/22 1637 06/25/22 2118 06/26/22 0621  GLUCAP 256* 168* 127*    Assessment/Plan: S/P Procedure(s) (LRB): VAD POWERCORD TUNNEL WOUND VAC CHANGE (N/A) Return to OR in am for wound debridement, gen anesthesia DC heparin on call to OR   LOS: 14 days    Lovett Sox 06/26/2022

## 2022-06-26 NOTE — Plan of Care (Signed)
  Problem: Education: Goal: Knowledge of General Education information will improve Description: Including pain rating scale, medication(s)/side effects and non-pharmacologic comfort measures Outcome: Progressing   Problem: Health Behavior/Discharge Planning: Goal: Ability to manage health-related needs will improve Outcome: Progressing   Problem: Clinical Measurements: Goal: Ability to maintain clinical measurements within normal limits will improve Outcome: Progressing Goal: Will remain free from infection Outcome: Progressing Goal: Diagnostic test results will improve Outcome: Progressing Goal: Respiratory complications will improve Outcome: Progressing Goal: Cardiovascular complication will be avoided Outcome: Progressing   Problem: Activity: Goal: Risk for activity intolerance will decrease Outcome: Progressing   Problem: Nutrition: Goal: Adequate nutrition will be maintained Outcome: Progressing   Problem: Coping: Goal: Level of anxiety will decrease Outcome: Progressing   Problem: Elimination: Goal: Will not experience complications related to bowel motility Outcome: Progressing Goal: Will not experience complications related to urinary retention Outcome: Progressing   Problem: Pain Managment: Goal: General experience of comfort will improve Outcome: Progressing   Problem: Safety: Goal: Ability to remain free from injury will improve Outcome: Progressing   Problem: Skin Integrity: Goal: Risk for impaired skin integrity will decrease Outcome: Progressing   Problem: Education: Goal: Ability to describe self-care measures that may prevent or decrease complications (Diabetes Survival Skills Education) will improve Outcome: Progressing Goal: Individualized Educational Video(s) Outcome: Progressing   Problem: Coping: Goal: Ability to adjust to condition or change in health will improve Outcome: Progressing   Problem: Fluid Volume: Goal: Ability to  maintain a balanced intake and output will improve Outcome: Progressing   Problem: Health Behavior/Discharge Planning: Goal: Ability to identify and utilize available resources and services will improve Outcome: Progressing Goal: Ability to manage health-related needs will improve Outcome: Progressing   Problem: Metabolic: Goal: Ability to maintain appropriate glucose levels will improve Outcome: Progressing   Problem: Nutritional: Goal: Maintenance of adequate nutrition will improve Outcome: Progressing Goal: Progress toward achieving an optimal weight will improve Outcome: Progressing   Problem: Skin Integrity: Goal: Risk for impaired skin integrity will decrease Outcome: Progressing   Problem: Tissue Perfusion: Goal: Adequacy of tissue perfusion will improve Outcome: Progressing   Problem: Education: Goal: Ability to demonstrate management of disease process will improve Outcome: Progressing Goal: Ability to verbalize understanding of medication therapies will improve Outcome: Progressing Goal: Individualized Educational Video(s) Outcome: Progressing   Problem: Activity: Goal: Capacity to carry out activities will improve Outcome: Progressing   Problem: Cardiac: Goal: Ability to achieve and maintain adequate cardiopulmonary perfusion will improve Outcome: Progressing   Problem: Education: Goal: Patient will understand all VAD equipment and how it functions Outcome: Progressing Goal: Patient will be able to verbalize current INR target range and antiplatelet therapy for discharge home Outcome: Progressing   Problem: Cardiac: Goal: LVAD will function as expected and patient will experience no clinical alarms Outcome: Progressing   

## 2022-06-27 ENCOUNTER — Encounter (HOSPITAL_COMMUNITY)
Admission: AD | Disposition: A | Discharge status: Home / Self Care | Payer: Self-pay | Source: Home / Self Care | Attending: Cardiology

## 2022-06-27 ENCOUNTER — Inpatient Hospital Stay (HOSPITAL_COMMUNITY): Payer: 59 | Admitting: Certified Registered"

## 2022-06-27 ENCOUNTER — Encounter (HOSPITAL_COMMUNITY): Payer: Self-pay | Admitting: Cardiology

## 2022-06-27 DIAGNOSIS — S31109A Unspecified open wound of abdominal wall, unspecified quadrant without penetration into peritoneal cavity, initial encounter: Secondary | ICD-10-CM | POA: Diagnosis not present

## 2022-06-27 DIAGNOSIS — I25119 Atherosclerotic heart disease of native coronary artery with unspecified angina pectoris: Secondary | ICD-10-CM

## 2022-06-27 DIAGNOSIS — Z87891 Personal history of nicotine dependence: Secondary | ICD-10-CM

## 2022-06-27 DIAGNOSIS — I5022 Chronic systolic (congestive) heart failure: Secondary | ICD-10-CM | POA: Diagnosis not present

## 2022-06-27 DIAGNOSIS — I110 Hypertensive heart disease with heart failure: Secondary | ICD-10-CM

## 2022-06-27 DIAGNOSIS — Z95811 Presence of heart assist device: Secondary | ICD-10-CM | POA: Diagnosis not present

## 2022-06-27 DIAGNOSIS — T827XXA Infection and inflammatory reaction due to other cardiac and vascular devices, implants and grafts, initial encounter: Secondary | ICD-10-CM | POA: Diagnosis not present

## 2022-06-27 HISTORY — PX: APPLICATION OF WOUND VAC: SHX5189

## 2022-06-27 HISTORY — PX: STERNAL WOUND DEBRIDEMENT: SHX1058

## 2022-06-27 LAB — BASIC METABOLIC PANEL
Anion gap: 8 (ref 5–15)
BUN: 34 mg/dL — ABNORMAL HIGH (ref 8–23)
CO2: 26 mmol/L (ref 22–32)
Calcium: 8.8 mg/dL — ABNORMAL LOW (ref 8.9–10.3)
Chloride: 103 mmol/L (ref 98–111)
Creatinine, Ser: 1.28 mg/dL — ABNORMAL HIGH (ref 0.61–1.24)
GFR, Estimated: >60 mL/min (ref >60)
Glucose, Bld: 93 mg/dL (ref 70–99)
Potassium: 4.2 mmol/L (ref 3.5–5.1)
Sodium: 137 mmol/L (ref 135–145)

## 2022-06-27 LAB — CBC
HCT: 31.6 % — ABNORMAL LOW (ref 39.0–52.0)
Hemoglobin: 10 g/dL — ABNORMAL LOW (ref 13.0–17.0)
MCH: 27.7 pg (ref 26.0–34.0)
MCHC: 31.6 g/dL (ref 30.0–36.0)
MCV: 87.5 fL (ref 80.0–100.0)
Platelets: 205 10*3/uL (ref 150–400)
RBC: 3.61 MIL/uL — ABNORMAL LOW (ref 4.22–5.81)
RDW: 18.7 % — ABNORMAL HIGH (ref 11.5–15.5)
WBC: 8.5 10*3/uL (ref 4.0–10.5)
nRBC: 0 % (ref 0.0–0.2)

## 2022-06-27 LAB — LACTATE DEHYDROGENASE: LDH: 147 U/L (ref 98–192)

## 2022-06-27 LAB — GLUCOSE, CAPILLARY
Glucose-Capillary: 88 mg/dL (ref 70–99)
Glucose-Capillary: 94 mg/dL (ref 70–99)
Glucose-Capillary: 98 mg/dL (ref 70–99)
Glucose-Capillary: 161 mg/dL — ABNORMAL HIGH (ref 70–99)
Glucose-Capillary: 295 mg/dL — ABNORMAL HIGH (ref 70–99)
Glucose-Capillary: 177 mg/dL — ABNORMAL HIGH (ref 70–99)

## 2022-06-27 LAB — PROTIME-INR
INR: 1.7 — ABNORMAL HIGH (ref 0.8–1.2)
Prothrombin Time: 19.4 seconds — ABNORMAL HIGH (ref 11.4–15.2)

## 2022-06-27 LAB — HEPARIN LEVEL (UNFRACTIONATED): Heparin Unfractionated: <0.1 IU/mL — ABNORMAL LOW (ref 0.30–0.70)

## 2022-06-27 SURGERY — DEBRIDEMENT, WOUND, STERNUM
Anesthesia: General

## 2022-06-27 MED ORDER — CHLORHEXIDINE GLUCONATE 0.12 % MT SOLN
OROMUCOSAL | Status: AC
  Administered 2022-06-27: 15 mL via OROMUCOSAL
  Filled 2022-06-27: qty 15

## 2022-06-27 MED ORDER — VASOPRESSIN 20 UNIT/ML IV SOLN
INTRAVENOUS | Status: AC
Start: 2022-06-27 — End: ?
  Filled 2022-06-27: qty 1

## 2022-06-27 MED ORDER — HYDRALAZINE HCL 25 MG PO TABS
12.5000 mg | ORAL_TABLET | Freq: Three times a day (TID) | ORAL | Status: DC
Start: 2022-06-27 — End: 2022-06-28
  Administered 2022-06-27 (×3): 12.5 mg via ORAL
  Filled 2022-06-27 (×3): qty 1

## 2022-06-27 MED ORDER — ROCURONIUM BROMIDE 10 MG/ML (PF) SYRINGE
PREFILLED_SYRINGE | INTRAVENOUS | Status: DC | PRN
  Administered 2022-06-27: 40 mg via INTRAVENOUS

## 2022-06-27 MED ORDER — SUGAMMADEX SODIUM 200 MG/2ML IV SOLN
INTRAVENOUS | Status: DC | PRN
  Administered 2022-06-27 (×2): 50 mg via INTRAVENOUS

## 2022-06-27 MED ORDER — MIDAZOLAM HCL 2 MG/2ML IJ SOLN
INTRAMUSCULAR | Status: AC
  Filled 2022-06-27: qty 2

## 2022-06-27 MED ORDER — LIDOCAINE 2% (20 MG/ML) 5 ML SYRINGE
INTRAMUSCULAR | Status: DC | PRN
  Administered 2022-06-27: 80 mg via INTRAVENOUS

## 2022-06-27 MED ORDER — CHLORHEXIDINE GLUCONATE 0.12 % MT SOLN: 15.0000 mL | Freq: Once | OROMUCOSAL | Status: AC

## 2022-06-27 MED ORDER — VANCOMYCIN HCL 1000 MG IV SOLR
INTRAVENOUS | Status: AC
  Filled 2022-06-27: qty 20

## 2022-06-27 MED ORDER — ONDANSETRON HCL 4 MG/2ML IJ SOLN
INTRAMUSCULAR | Status: DC | PRN
  Administered 2022-06-27: 4 mg via INTRAVENOUS

## 2022-06-27 MED ORDER — FENTANYL CITRATE (PF) 250 MCG/5ML IJ SOLN
INTRAMUSCULAR | Status: AC
  Filled 2022-06-27: qty 5

## 2022-06-27 MED ORDER — WARFARIN SODIUM 4 MG PO TABS
8.0000 mg | ORAL_TABLET | Freq: Once | ORAL | Status: AC
  Administered 2022-06-27: 8 mg via ORAL
  Filled 2022-06-27: qty 2

## 2022-06-27 MED ORDER — PROPOFOL 10 MG/ML IV BOLUS
INTRAVENOUS | Status: AC
  Filled 2022-06-27: qty 20

## 2022-06-27 MED ORDER — FENTANYL CITRATE (PF) 250 MCG/5ML IJ SOLN
INTRAMUSCULAR | Status: DC | PRN
  Administered 2022-06-27 (×2): 50 ug via INTRAVENOUS

## 2022-06-27 MED ORDER — MIDAZOLAM HCL 2 MG/2ML IJ SOLN
INTRAMUSCULAR | Status: DC | PRN
  Administered 2022-06-27: 1 mg via INTRAVENOUS

## 2022-06-27 MED ORDER — ORAL CARE MOUTH RINSE: 15.0000 mL | Freq: Once | OROMUCOSAL | Status: AC

## 2022-06-27 MED ORDER — WARFARIN SODIUM 7.5 MG PO TABS
7.5000 mg | ORAL_TABLET | Freq: Once | ORAL | Status: DC
Start: 2022-06-27 — End: 2022-06-27

## 2022-06-27 MED ORDER — HEPARIN (PORCINE) 25000 UT/250ML-% IV SOLN
500.0000 [IU]/h | INTRAVENOUS | Status: DC
  Administered 2022-06-27 – 2022-07-01 (×3): 500 [IU]/h via INTRAVENOUS
  Filled 2022-06-27 (×3): qty 250

## 2022-06-27 MED ORDER — ALBUMIN HUMAN 5 % IV SOLN: INTRAVENOUS | Status: DC | PRN

## 2022-06-27 MED ORDER — PHENYLEPHRINE 80 MCG/ML (10ML) SYRINGE FOR IV PUSH (FOR BLOOD PRESSURE SUPPORT)
PREFILLED_SYRINGE | INTRAVENOUS | Status: DC | PRN
  Administered 2022-06-27: 240 ug via INTRAVENOUS

## 2022-06-27 MED ORDER — DEXAMETHASONE SODIUM PHOSPHATE 10 MG/ML IJ SOLN
INTRAMUSCULAR | Status: DC | PRN
  Administered 2022-06-27: 5 mg via INTRAVENOUS

## 2022-06-27 MED ORDER — SODIUM CHLORIDE 0.9 % IR SOLN
Status: DC | PRN
  Administered 2022-06-27: 2000 mL

## 2022-06-27 MED ORDER — VANCOMYCIN HCL 1000 MG IV SOLR
INTRAVENOUS | Status: DC | PRN
  Administered 2022-06-27: 1000 mg

## 2022-06-27 MED ORDER — PHENYLEPHRINE HCL-NACL 20-0.9 MG/250ML-% IV SOLN
INTRAVENOUS | Status: DC | PRN
  Administered 2022-06-27: 40 ug/min via INTRAVENOUS

## 2022-06-27 MED ORDER — LACTATED RINGERS IV SOLN: INTRAVENOUS | Status: DC

## 2022-06-27 MED ORDER — ETOMIDATE 2 MG/ML IV SOLN
INTRAVENOUS | Status: DC | PRN
  Administered 2022-06-27: 2 mg via INTRAVENOUS
  Administered 2022-06-27: 16 mg via INTRAVENOUS

## 2022-06-27 SURGICAL SUPPLY — 79 items
ATTRACTOMAT 16X20 MAGNETIC DRP (DRAPES) ×1 IMPLANT
BAG DECANTER FOR FLEXI CONT (MISCELLANEOUS) ×2 IMPLANT
BENZOIN TINCTURE PRP APPL 2/3 (GAUZE/BANDAGES/DRESSINGS) ×2 IMPLANT
BLADE CLIPPER SURG (BLADE) ×1 IMPLANT
BLADE SURG 10 STRL SS (BLADE) ×2 IMPLANT
BLADE SURG 15 STRL LF DISP TIS (BLADE) IMPLANT
BLADE SURG 15 STRL SS (BLADE)
BNDG GAUZE ELAST 4 BULKY (GAUZE/BANDAGES/DRESSINGS) IMPLANT
BRUSH SCRUB EZ PLAIN DRY (MISCELLANEOUS) ×2 IMPLANT
CANISTER SUCT 3000ML PPV (MISCELLANEOUS) ×1 IMPLANT
CANISTER WOUND CARE 500ML ATS (WOUND CARE) ×2 IMPLANT
CANISTER WOUNDNEG PRESSURE 500 (CANNISTER) ×1 IMPLANT
CATH FOLEY 2WAY SLVR  5CC 16FR (CATHETERS)
CATH FOLEY 2WAY SLVR 5CC 16FR (CATHETERS) IMPLANT
CATH THORACIC 28FR RT ANG (CATHETERS) IMPLANT
CATH THORACIC 36FR (CATHETERS) IMPLANT
CLIP VESOCCLUDE SM WIDE 24/CT (CLIP) IMPLANT
CNTNR URN SCR LID CUP LEK RST (MISCELLANEOUS) IMPLANT
CONN Y 3/8X3/8X3/8  BEN (MISCELLANEOUS)
CONN Y 3/8X3/8X3/8 BEN (MISCELLANEOUS) IMPLANT
CONT SPEC 4OZ STRL OR WHT (MISCELLANEOUS)
CONTAINER PROTECT SURGISLUSH (MISCELLANEOUS) ×2 IMPLANT
COVER SURGICAL LIGHT HANDLE (MISCELLANEOUS) ×4 IMPLANT
DRAPE INCISE IOBAN 66X45 STRL (DRAPES) IMPLANT
DRAPE LAPAROSCOPIC ABDOMINAL (DRAPES) ×2 IMPLANT
DRAPE SLUSH/WARMER DISC (DRAPES) IMPLANT
DRAPE WARM FLUID 44X44 (DRAPES) IMPLANT
DRSG AQUACEL AG ADV 3.5X14 (GAUZE/BANDAGES/DRESSINGS) ×1 IMPLANT
DRSG PAD ABDOMINAL 8X10 ST (GAUZE/BANDAGES/DRESSINGS) IMPLANT
DRSG VAC ATS LRG SENSATRAC (GAUZE/BANDAGES/DRESSINGS) ×1 IMPLANT
DRSG VAC ATS MED SENSATRAC (GAUZE/BANDAGES/DRESSINGS) ×1 IMPLANT
DRSG VAC ATS SM SENSATRAC (GAUZE/BANDAGES/DRESSINGS) ×2 IMPLANT
DRSG VERSA FOAM LRG 10X15 (GAUZE/BANDAGES/DRESSINGS) ×1 IMPLANT
ELECT REM PT RETURN 9FT ADLT (ELECTROSURGICAL) ×2
ELECTRODE REM PT RTRN 9FT ADLT (ELECTROSURGICAL) ×1 IMPLANT
GAUZE 4X4 16PLY ~~LOC~~+RFID DBL (SPONGE) ×1 IMPLANT
GAUZE SPONGE 4X4 12PLY STRL (GAUZE/BANDAGES/DRESSINGS) ×2 IMPLANT
GAUZE XEROFORM 5X9 LF (GAUZE/BANDAGES/DRESSINGS) IMPLANT
GLOVE BIO SURGEON STRL SZ 6 (GLOVE) ×1 IMPLANT
GLOVE BIO SURGEON STRL SZ7.5 (GLOVE) ×4 IMPLANT
GLOVE BIOGEL PI IND STRL 6 (GLOVE) IMPLANT
GLOVE BIOGEL PI INDICATOR 6 (GLOVE) ×2
GOWN STRL REUS W/ TWL LRG LVL3 (GOWN DISPOSABLE) ×4 IMPLANT
GOWN STRL REUS W/TWL LRG LVL3 (GOWN DISPOSABLE) ×4
GRAFT MYRIAD 3 LAYER 7X10 (Graft) ×1 IMPLANT
HANDPIECE INTERPULSE COAX TIP (DISPOSABLE) ×1
HEMOSTAT POWDER SURGIFOAM 1G (HEMOSTASIS) IMPLANT
HEMOSTAT SURGICEL 2X14 (HEMOSTASIS) IMPLANT
KIT BASIN OR (CUSTOM PROCEDURE TRAY) ×1 IMPLANT
KIT SUCTION CATH 14FR (SUCTIONS) IMPLANT
KIT TURNOVER KIT B (KITS) ×2 IMPLANT
NS IRRIG 1000ML POUR BTL (IV SOLUTION) ×3 IMPLANT
PACK CHEST (CUSTOM PROCEDURE TRAY) ×1 IMPLANT
PACK GENERAL/GYN (CUSTOM PROCEDURE TRAY) ×2 IMPLANT
PAD ARMBOARD 7.5X6 YLW CONV (MISCELLANEOUS) ×4 IMPLANT
POWDER MYRIAD MORCELLS 500MG (Miscellaneous) ×1 IMPLANT
SET HNDPC FAN SPRY TIP SCT (DISPOSABLE) ×1 IMPLANT
SLEEVE SCD COMPRESS KNEE MED (STOCKING) ×1 IMPLANT
SOL PREP POV-IOD 4OZ 10% (MISCELLANEOUS) ×1 IMPLANT
SPONGE T-LAP 18X18 ~~LOC~~+RFID (SPONGE) ×7 IMPLANT
SPONGE T-LAP 4X18 ~~LOC~~+RFID (SPONGE) ×1 IMPLANT
STAPLER VISISTAT 35W (STAPLE) IMPLANT
SURGILUBE 2OZ TUBE FLIPTOP (MISCELLANEOUS) ×1 IMPLANT
SUT ETHILON 3 0 FSL (SUTURE) IMPLANT
SUT STEEL 6MS V (SUTURE) IMPLANT
SUT STEEL STERNAL CCS#1 18IN (SUTURE) IMPLANT
SUT STEEL SZ 6 DBL 3X14 BALL (SUTURE) IMPLANT
SUT VIC AB 1 CTX 36 (SUTURE)
SUT VIC AB 1 CTX36XBRD ANBCTR (SUTURE) ×2 IMPLANT
SUT VIC AB 2-0 CTX 27 (SUTURE) ×4 IMPLANT
SUT VIC AB 3-0 X1 27 (SUTURE) ×2 IMPLANT
SUT VIC AB 5-0 PS2 18 (SUTURE) ×3 IMPLANT
SWAB COLLECTION DEVICE MRSA (MISCELLANEOUS) IMPLANT
SWAB CULTURE ESWAB REG 1ML (MISCELLANEOUS) IMPLANT
SYR 5ML LL (SYRINGE) IMPLANT
TOWEL GREEN STERILE (TOWEL DISPOSABLE) ×2 IMPLANT
TOWEL GREEN STERILE FF (TOWEL DISPOSABLE) ×2 IMPLANT
TRAY FOLEY MTR SLVR 16FR STAT (SET/KITS/TRAYS/PACK) IMPLANT
WATER STERILE IRR 1000ML POUR (IV SOLUTION) ×2 IMPLANT

## 2022-06-27 NOTE — Transfer of Care (Signed)
Immediate Anesthesia Transfer of Care Note  Patient: Kevin Underwood  Procedure(s) Performed: ABDOMINAL WOUND DEBRIDEMENT, APPLIECATION OF MYRIAD WOUND VAC CHANGE  Patient Location: PACU  Anesthesia Type:General  Level of Consciousness: drowsy  Airway & Oxygen Therapy: Patient Spontanous Breathing and Patient connected to nasal cannula oxygen  Post-op Assessment: Report given to RN and Post -op Vital signs reviewed and stable  Post vital signs: Reviewed and stable  Last Vitals:  Vitals Value Taken Time  BP 127/103 06/27/22 0954  Temp    Pulse 67 06/27/22 0954  Resp 13 06/27/22 0954  SpO2 97 % 06/27/22 0954  Vitals shown include unvalidated device data.  Last Pain:  Vitals:   06/27/22 0809  TempSrc: Oral  PainSc: 0-No pain      Patients Stated Pain Goal: 0 (99/80/01 2393)  Complications: No notable events documented.

## 2022-06-27 NOTE — Progress Notes (Signed)
VAD Coordinator Procedure Note:   VAD Coordinator met patient in OR. Pt undergoing abdominal wound debridement, with myriad placement, and wound vac change per Dr. Prescott Gum. Hemodynamics and VAD parameters monitored by myself and anesthesia throughout the procedure. Blood pressures were obtained with automatic cuff on left arm.     Time: Doppler Auto  BP Flow PI Power Speed  Pre-procedure:  0830  116/96 (102) 3.4 7.3 4.3 5600                    Sedation Induction: 0855  123/107 (114) 3.3 7.9 4.4 5600   0900  97/81 (88) 4.6 2.2 4.1 5600   0915  88/77 (83) 4.6 1.4 4.1 5600   0930  108/97 (102) 4.6 1.4 4.1 5600   0945  93/80 (86) 4.3 3.7 4.2 5600           Recovery Area: 0955  126/101 (109) 3.9 4.3 4.1 5600   1000  124/102 (110) 3.9 4.1 4.1 5600   1015  129/92 (102) 4.0 4.0 4.0 5600             Patient Disposition: Patient tolerated the procedure well. VAD Coordinator accompanied and remained with patient in recovery area.   Plan to return to OR for wound vac change 07/08/22 at 0730. Per Dr Lucianne Lei Kerby Less may resume Heparin drip at 3 pm.   Emerson Monte RN [place] Coordinator  Office: [phone]  24/7 Pager: [phone]

## 2022-06-27 NOTE — Progress Notes (Signed)
PHARMACY CONSULT NOTE FOR:  OUTPATIENT  PARENTERAL ANTIBIOTIC THERAPY (OPAT)  Indication: VAD Driveline Infection - MRSA  Regimen: Vancomycin 1000 mg every 24 hours  End date: 07/18/22   IV antibiotic discharge orders are pended. To discharging provider:  please sign these orders via discharge navigator,  Select New Orders & click on the button choice - Manage This Unsigned Work.     Thank you for allowing pharmacy to be a part of this patient's care.  Sharin Mons, PharmD, BCPS, BCIDP Infectious Diseases Clinical Pharmacist Phone: [phone] 06/27/2022, 10:48 AM

## 2022-06-27 NOTE — Op Note (Signed)
NAME: Kevin Underwood, Kevin Underwood MEDICAL RECORD NO: [id number] ACCOUNT NO: [id number] DATE OF BIRTH: August 12, 1961 FACILITY: [facility] LOCATION: [facility] PHYSICIAN: Ivin Poot III, MD  Operative Report   DATE OF PROCEDURE: 06/27/2022  OPERATION: 1.  Excisional debridement of abdominal wound driveline tunnel site. 2.  Application of Myriad matrix and powder to assist with wound healing. 3.  Wound VAC change with small black sponge and white sponge in the medial aspect of the wound.  SURGEON:  Len Childs, MD  ASSISTANT SURGEON:  Audelia Hives, DO  ANESTHESIA:  General.  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  DESCRIPTION OF PROCEDURE:  The patient was brought from preoperative holding where informed consent was documented and preoperative issues discussed with the patient and questions addressed.  The patient was brought directly to the operating room and  placed supine on the operating room table.  General anesthesia was induced.  The patient remained stable.  The VAD coordinator was present for the entire procedure to run the VAD equipment and to help with management of hemodynamics.  The patient's  previous wound VAC sponge was removed and the abdomen and lower chest was prepped and draped as a sterile field.  A proper timeout was performed.  The wound was inspected.  Since the previous wound VAC change and application of Myriad matrix, the wound  has improved.  There was some incorporation of the Myriad matrix into the wound bed.  There was improvement and increase in granulation tissue.  There was no purulence.  There was no deeper extension medially of the infection and no further extension of  the incision was needed.  The wound was then gently irrigated with saline and excisional  debridement of some unhealthy granulation tissue medially was excised.  The wound bed is now more shallow and this was debrided with curettage down to healthy  tissue.  The wound was then irrigated with vancomycin.  Hemostasis was achieved.  A new sheath of myriad matrix was applied to the wound bed with some Myriad powder (Morcells) applied to the medial aspect of the wound where there was potential tunneling.   The matrix was tacked to the wound with 5-0 Vicryls.  It is over this, a layer of Sorbact membrane was tacked to the wound with 5-0 Vicryls.  Over this lubricant was applied and then the wound VAC sponges applied on top of the Sorbact with a white  sponge inserted medially and the black sponge, the remainder of the wound wrapped around the power cord.  Sterile drapes were applied to the skin over the sponge and the suction line was attached and started at -125 suction.  There was good compression  of the sponge and no air leak.  The patient was then reversed from anesthesia and returned to recovery room in stable condition.  The VAD coordinator accompanying the patient was transferred to the recovery room and stayed with the patient in recovery  room for management of the VAD equipment and hemodynamic monitoring.   NIK D: 06/27/2022 10:17:30 am T: 06/27/2022 10:46:00 am  JOB: [id number]/ [id number]

## 2022-06-27 NOTE — Progress Notes (Addendum)
Patient ID: Kevin Underwood, male   DOB: 1961/07/25, 61 y.o.   MRN: [id number]   Advanced Heart Failure VAD Team Note  PCP-Cardiologist: Rozann Lesches, MD   Subjective:    S/p driveline site debridement and wound vac on 7/7.  7/10 Had Ramp ECHO. Speed optimized. 7/13 OR to [facility] dressing change.  Back on Heparin drip.   Feels good, ready to go to OR. Wound vac still draining. Afebrile.   On heparin/warfarin INR 1.7    SCr downtrending 1.5>>1.28 today  Denies CP or SOB.   LVAD INTERROGATION:  HeartMate III LVAD:   Flow 4.3  liters/min, speed 5600, power 4.2, PI 2.8. VAD interrogated personally. Parameters stable.  Objective:    Vital Signs:   Temp:  [97.7 F (36.5 C)-98.6 F (37 C)] 97.9 F (36.6 C) (07/20 0451) Pulse Rate:  [74-80] 75 (07/20 0451) Resp:  [14-18] 14 (07/20 0451) BP: (86-133)/(68-99) 86/70 (07/20 0451) SpO2:  [95 %-98 %] 95 % (07/20 0451) Weight:  [84.2 kg] 84.2 kg (07/20 0641) Last BM Date : 06/26/22 Mean arterial Pressure  90-100 Intake/Output:   Intake/Output Summary (Last 24 hours) at 06/27/2022 0710 Last data filed at 06/27/2022 0507 Gross per 24 hour  Intake 922.7 ml  Output 1550 ml  Net -627.3 ml     Physical Exam   General:  Well appearing. No respiratory difficulty HEENT: normal Neck: supple. No JVD. Carotids 2+ bilat; no bruits. No lymphadenopathy or thyromegaly appreciated. Cor: VAD hum  Lungs: clear Abdomen: soft, nondistended. No hepatosplenomegaly. No bruits or masses. Good bowel sounds.  LVAD exit site: wound vac, some tenderness with good suction Extremities: no cyanosis, clubbing, rash, edema  Neuro: alert & oriented x 3, cranial nerves grossly intact. moves all 4 extremities w/o difficulty. Affect pleasant.  Telemetry    NSR 70s (Personally reviewed)   Labs   Basic Metabolic Panel: Recent Labs  Lab 06/23/22 0705 06/24/22 0500 06/25/22 0500 06/26/22 0400 06/27/22 0450  NA 137 138 137 138 137  K 4.2 4.3 4.2 4.2 4.2   CL 101 102 103 104 103  CO2 25 26 25 26 26   GLUCOSE 152* 167* 132* 153* 93  BUN 31* 31* 31* 35* 34*  CREATININE 1.44* 2.01* 1.39* 1.51* 1.28*  CALCIUM 8.7* 8.9 8.9 8.7* 8.8*    Liver Function Tests: No results for input(s): "AST", "ALT", "ALKPHOS", "BILITOT", "PROT", "ALBUMIN" in the last 168 hours.  No results for input(s): "LIPASE", "AMYLASE" in the last 168 hours. No results for input(s): "AMMONIA" in the last 168 hours.  CBC: Recent Labs  Lab 06/23/22 0705 06/24/22 0500 06/25/22 0500 06/26/22 0400 06/27/22 0450  WBC 10.1 10.9* 10.7* 11.2* 8.5  HGB 10.2* 10.7* 10.7* 10.7* 10.0*  HCT 32.0* 33.7* 33.4* 32.3* 31.6*  MCV 86.0 87.1 86.5 86.1 87.5  PLT 229 235 210 220 205    INR: Recent Labs  Lab 06/23/22 0705 06/24/22 0500 06/25/22 0500 06/26/22 0400 06/27/22 0450  INR 1.4* 1.5* 1.6* 1.7* 1.7*    Other results:   Imaging   No results found.   Medications:     Scheduled Medications:  acetaminophen  1,000 mg Oral Once   allopurinol  100 mg Oral Daily   Chlorhexidine Gluconate Cloth  6 each Topical Daily   docusate sodium  200 mg Oral BID   gabapentin  600 mg Oral BID   guaiFENesin  600 mg Oral BID   hydrALAZINE  25 mg Oral TID   insulin aspart  0-15 Units  Subcutaneous TID WC   insulin aspart  0-5 Units Subcutaneous QHS   insulin detemir  15 Units Subcutaneous QHS   lactose free nutrition  237 mL Oral TID WC   loratadine  10 mg Oral Daily   methimazole  20 mg Oral BID   metoprolol succinate  25 mg Oral Daily   pantoprazole  40 mg Oral Daily   polyethylene glycol  17 g Oral Daily   rosuvastatin  20 mg Oral Daily   sertraline  50 mg Oral Daily   tamsulosin  0.4 mg Oral Daily   traZODone  150 mg Oral QHS   Warfarin - Pharmacist Dosing Inpatient   Does not apply q1600    Infusions:   ceFAZolin (ANCEF) IV     heparin 500 Units/hr (06/27/22 0507)   vancomycin Stopped (06/26/22 2049)    PRN Medications: acetaminophen, albuterol, morphine  injection, ondansetron (ZOFRAN) IV, mouth rinse, sorbitol, traMADol   Patient Profile   61 y.o. male with history of HM III LVAD, chronic systolic CHF/ICM, COPD, PAF. 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   Readmitted for driveline infection and surgical debridement/possible wound vac placement.  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 S/p driveline site debridement 6/8. Surgical wound cultures grew MRSA, repeat debridement 6/23. Driveline debridement with wound vac placement on 7/7.  - Transitioned from daptomycin to vancomycin due to elevated CK.  Vancomycin planned x 6 wks.  - 7/13 VAC dressing change.  - Vac working well  - Plan to return to OR today for vac change - Continue heparin drip and warfarin. INR 1.7. Discussed dosing with PharmD personally. Hold warfarin today 2.  Chronic systolic CHF: Echo 10/21 with EF 20-25%, mildly decreased RV function. LHC/RHC in 12/21 with patent grafts, low output. Suspect mixed ischemic/nonischemic cardiomyopathy (prior heavy ETOH and drugs as well as CAD).  No ETOH, drugs, smoking since CABG in 12/20. Admitted with cardiogenic shock in 12/21, had placement of Impella 5.5 initially, now s/p Heartmate 3 LVAD on 11/17/20.  Ramp echo 2/23 with speed decreased to 5500 rpm. He would eventually like heart transplant. NYHA class II. Have been allowing MAP to run higher due to recent AKI thought to be due to lowering BP too much in setting of right renal artery stenosis.  - Had Ramp ECHO with speed optimized.  MAP 70-90s. INR 1.7 today on warfarin.+ heparin drip.  LDH stable - Volume status ok - MAPs 70-90s today. Decrease hydral 25-> 12.5. Watch MAPs closely with renal artery stenosis. Could be correlated w/ pain med administration. Try to avoid narcotics if possible to keep MAP's 90-100 - Continue Toprol XL 25 mg daily.  - Continue warfarin/low dose heparin.  3. AKI: Creatinine  up to 4.6 last admission, initially thought this was ATN from low BP but he was never markedly hypotensive and LFTs without any evidence of shock liver. ? Vanc toxicity (troughs were ok). Taken off Entresto and hydralazine. Volume repleted with IVF. In reviewing prior CTA abdomen/pelvis, he has high-grade stenosis of proximal right renal artery. D/w Renal and feel like he may need higher perfusion pressures. SCr 1.4-> 1.5-> 1.28 Let MAPs run 90-100 - Would consider reassessment of renal artery stenosis and possible treatment once creatinine has improved to baseline.  4.  CAD: S/p CABG 12/20.  LHC pre-VAD with patent grafts, no target for intervention. - Stopped statin with elevated CK => this was in setting of daptomycin.  -  CK dow , statin restarted.  5.Atrial fibrillation: Paroxysmal.  S/p Maze and LA appendage clip with CABG in 12/20. In NSR today - Continue Toprol XL 25 mg daily.  - Off amiodarone with hyperthyroidism. - On Warfarin + heparin.  6.  Type 2 diabetes: Takes glipizide at home.  - SSI  - Continue Levemir 15 units qHS - Diabetes coordinator following 7. 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  8. 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  He missed his endocrinology appt. Has been rescheduled for July 27th. - He is now off amiodarone.   - Continue methimazole 20 mg bid, TSH was mildly elevated.   9.  Gout: On allopurinol.  10. Anemia (chronic blood loss):  - Got 1u RBCs on 7/12 Hgb stable at 10 today No obvious source of bleeding   Plan to return to the OR today for debridement    I reviewed the LVAD parameters from today, and compared the results to the patient's prior recorded data.  No programming changes were made.  The LVAD is functioning within specified parameters.  The patient performs LVAD self-test daily.  LVAD interrogation was negative for any significant power changes, alarms or PI events/speed drops.  LVAD equipment check  completed and is in good working order.  Back-up equipment present.   LVAD education done on emergency procedures and precautions and reviewed exit site care.  Length of Stay: [address] AGACNP-BC 06/27/2022, 7:10 AM  VAD Team --- VAD ISSUES ONLY--- Pager [phone] (7am - 7am)  Advanced Heart Failure Team  Pager [phone] (M-F; 7a - 5p)  Please contact [facility] for night-coverage after hours (5p -7a ) and weekends on [email or web address]  Patient seen and examined with the above-signed Advanced Practice Provider and/or Housestaff. I personally reviewed laboratory data, imaging studies and relevant notes. I independently examined the patient and formulated the important aspects of the plan. I have edited the note to reflect any of my changes or salient points. I have personally discussed the plan with the patient and/or family.  Feels good. No fevers or chills. Remains on IV abx. Wound vac working well. MAPs ok. Scr improved   General:  NAD.  HEENT: normal  Neck: supple. JVP not elevated.  Carotids 2+ bilat; no bruits. No lymphadenopathy or thryomegaly appreciated. Cor: LVAD hum.  Lungs: Clear. Abdomen: obese soft, nontender, non-distended. No hepatosplenomegaly. No bruits or masses. Good bowel sounds. Wound vac ok  Extremities: no cyanosis, clubbing, rash. Warm no edema  Neuro: alert & oriented x 3. No focal deficits. Moves all 4 without problem   Scr improved with decreasing hydral. On heparin/warfarin. To OR today for wound vac change. Can stop heparin when INR > 1.8  Discussed dosing with PharmD personally.  Arvilla Meres, MD  8:09 AM

## 2022-06-27 NOTE — Anesthesia Procedure Notes (Signed)
Procedure Name: Intubation Date/Time: 06/27/2022 8:56 AM  Performed by: Elliot Dally, CRNAPre-anesthesia Checklist: Patient identified, Emergency Drugs available, Suction available and Patient being monitored Patient Re-evaluated:Patient Re-evaluated prior to induction Oxygen Delivery Method: Circle System Utilized Preoxygenation: Pre-oxygenation with 100% oxygen Induction Type: IV induction Ventilation: Mask ventilation without difficulty and Oral airway inserted - appropriate to patient size Laryngoscope Size: Hyacinth Meeker and 2 Grade View: Grade I Tube type: Oral Tube size: 7.5 mm Number of attempts: 1 Airway Equipment and Method: Stylet and Oral airway Placement Confirmation: ETT inserted through vocal cords under direct vision, positive ETCO2 and breath sounds checked- equal and bilateral Secured at: 24 cm Tube secured with: Tape Dental Injury: Teeth and Oropharynx as per pre-operative assessment

## 2022-06-27 NOTE — Brief Op Note (Signed)
06/27/2022  9:57 AM  PATIENT:  Kevin Underwood  61 y.o. male  PRE-OPERATIVE DIAGNOSIS:  ABDOMINAL WOUND  POST-OPERATIVE DIAGNOSIS:  ABDOMINAL WOUND  PROCEDURE:  Procedure(s): ABDOMINAL WOUND DEBRIDEMENT, APPLIECATION OF MYRIAD (N/A) WOUND VAC CHANGE (N/A)  FINDINGS: WOUND IS IMPROVED WITH [employer] MYRIAD MATRIX, DEPOSITION OF HEALTHY GRANULATION TISSUE, NO FURTHER MEDIAL EXTENSION OF INCISION NEEDED.   PLAN-wound vac change in 10 days, 7-31  SURGEON:  Surgeon(s) and Role:    [name], MD - Primary    * Dillingham, Loel Lofty, DO - Assisting  PHYSICIAN ASSISTANT:   ASSISTANTS: none   ANESTHESIA:   general  EBL:  25 mL   BLOOD ADMINISTERED:none  DRAINS: none   LOCAL MEDICATIONS USED:  NONE  SPECIMEN:  No Specimen  DISPOSITION OF SPECIMEN:  N/A  COUNTS:  YES  TOURNIQUET:  * No tourniquets in log *  DICTATION: .Dragon Dictation  PLAN OF CARE:  return to 2C  PATIENT DISPOSITION:  PACU - hemodynamically stable.   Delay start of Pharmacological VTE agent (>24hrs) due to surgical blood loss or risk of bleeding: yes Resume heparin 3pm today

## 2022-06-27 NOTE — Plan of Care (Signed)
  Problem: Education: Goal: Knowledge of General Education information will improve Description: Including pain rating scale, medication(s)/side effects and non-pharmacologic comfort measures Outcome: Progressing   Problem: Health Behavior/Discharge Planning: Goal: Ability to manage health-related needs will improve Outcome: Progressing   Problem: Clinical Measurements: Goal: Ability to maintain clinical measurements within normal limits will improve Outcome: Progressing Goal: Will remain free from infection Outcome: Progressing Goal: Diagnostic test results will improve Outcome: Progressing Goal: Respiratory complications will improve Outcome: Progressing Goal: Cardiovascular complication will be avoided Outcome: Progressing   Problem: Activity: Goal: Risk for activity intolerance will decrease Outcome: Progressing   Problem: Nutrition: Goal: Adequate nutrition will be maintained Outcome: Progressing   Problem: Coping: Goal: Level of anxiety will decrease Outcome: Progressing   Problem: Elimination: Goal: Will not experience complications related to bowel motility Outcome: Progressing Goal: Will not experience complications related to urinary retention Outcome: Progressing   Problem: Pain Managment: Goal: General experience of comfort will improve Outcome: Progressing   Problem: Safety: Goal: Ability to remain free from injury will improve Outcome: Progressing   Problem: Skin Integrity: Goal: Risk for impaired skin integrity will decrease Outcome: Progressing   Problem: Education: Goal: Ability to describe self-care measures that may prevent or decrease complications (Diabetes Survival Skills Education) will improve Outcome: Progressing Goal: Individualized Educational Video(s) Outcome: Progressing   Problem: Coping: Goal: Ability to adjust to condition or change in health will improve Outcome: Progressing   Problem: Fluid Volume: Goal: Ability to  maintain a balanced intake and output will improve Outcome: Progressing   Problem: Health Behavior/Discharge Planning: Goal: Ability to identify and utilize available resources and services will improve Outcome: Progressing Goal: Ability to manage health-related needs will improve Outcome: Progressing   Problem: Metabolic: Goal: Ability to maintain appropriate glucose levels will improve Outcome: Progressing   Problem: Nutritional: Goal: Maintenance of adequate nutrition will improve Outcome: Progressing Goal: Progress toward achieving an optimal weight will improve Outcome: Progressing   Problem: Skin Integrity: Goal: Risk for impaired skin integrity will decrease Outcome: Progressing   Problem: Tissue Perfusion: Goal: Adequacy of tissue perfusion will improve Outcome: Progressing   Problem: Education: Goal: Ability to demonstrate management of disease process will improve Outcome: Progressing Goal: Ability to verbalize understanding of medication therapies will improve Outcome: Progressing Goal: Individualized Educational Video(s) Outcome: Progressing   Problem: Activity: Goal: Capacity to carry out activities will improve Outcome: Progressing   Problem: Cardiac: Goal: Ability to achieve and maintain adequate cardiopulmonary perfusion will improve Outcome: Progressing   Problem: Education: Goal: Patient will understand all VAD equipment and how it functions Outcome: Progressing Goal: Patient will be able to verbalize current INR target range and antiplatelet therapy for discharge home Outcome: Progressing   Problem: Cardiac: Goal: LVAD will function as expected and patient will experience no clinical alarms Outcome: Progressing   

## 2022-06-27 NOTE — Progress Notes (Signed)
Pre Procedure note for inpatients:   Kevin Underwood has been scheduled for Procedure(s): ABDOMINAL WOUND DEBRIDEMENT (N/A) WOUND VAC CHANGE (N/A) today. The various methods of treatment have been discussed with the patient. After consideration of the risks, benefits and treatment options the patient has consented to the planned procedure.   The patient has been seen and labs reviewed. There are no changes in the patient's condition to prevent proceeding with the planned procedure today.  Recent labs:  Lab Results  Component Value Date   WBC 8.5 06/27/2022   HGB 10.0 (L) 06/27/2022   HCT 31.6 (L) 06/27/2022   PLT 205 06/27/2022   GLUCOSE 93 06/27/2022   CHOL 142 09/11/2021   TRIG 95 09/11/2021   HDL 62 09/11/2021   LDLCALC 61 09/11/2021   ALT 86 (H) 06/12/2022   AST 68 (H) 06/12/2022   NA 137 06/27/2022   K 4.2 06/27/2022   CL 103 06/27/2022   CREATININE 1.28 (H) 06/27/2022   BUN 34 (H) 06/27/2022   CO2 26 06/27/2022   TSH 7.790 (H) 05/14/2022   INR 1.7 (H) 06/27/2022   HGBA1C 8.2 (H) 05/13/2022    Lovett Sox, MD 06/27/2022 8:30 AM

## 2022-06-27 NOTE — Progress Notes (Addendum)
LVAD Coordinator Rounding Note:  Admitted 06/12/22 to Dr. Alford Highland service due to drive line debridement scheduled with Dr Donata Clay 06/14/22.   S/p driveline site debridement and wound vac on 7/7.  7/10 Had Ramp ECHO. Speed optimized. 7/13 OR to [facility] dressing change.  Back on Heparin drip.   Pt laying in bed. Denies complaints. Ready to have procedure completed this morning.   Wound vac changed today in OR per Dr Donata Clay. Will plan to return to OR 07/08/22 at 0730 for wound vac change.   Crestor and daptomycin stopped w/ elevated CK level, no muscle pain.  CK 1000 => 757 => 598=>125=>59 .   Back on statin. Now on vancomycin. Plan for 6 weeks Vancomycin per ID team.   Vital signs: Temp:   HR: 57 Doppler Pressure:  Automatic BP: 116/96 (102) O2 Sat: 97% on RA Wt: 171.7>174.6>178.6>182.9>181>182.3>182.7>186.7>184.5>184.3>186.7>185.6 lbs   LVAD interrogation reveals:  Speed: 5600 Flow:  4.5 Power:  4.1w PI: 1.7 Alarms: none Events: 12 PI events so far today Hct: 34  Fixed speed:  5600 Low speed limit:  5300  Drive Line:  Wound vac change in OR today per Dr Donata Clay. Suction at 125; no leaks noted; draining serosanguinous fluid. Anchor intact to drive line. Planned wound vac change Monday, 07/08/22 per Dr. Donata Clay.   Labs:  LDH trend: 249>244>208>22>229>199>210>189>163>160>147>157>147  INR trend: 1.3 >1.3>1.2>1.1>1.1>1.1>1.1>1.2>1.3>1.5>1.6>1.7>1.7  Anticoagulation Plan: -INR Goal: 2.0 - 2.5 -ASA Dose: none  ICD: N/A  Drips:  Heparin - 500 units/hr  Infection:  06/07/22>> repeat drive line culture>> abundant MRSA; final 06/2022>> OR culture>>   Blood Products: - 06/21/22>> 2 PRBC   Plan/Recommendations:  Page VAD coordinators for equipment or drive line issues Plan for drive line debridement in OR per Dr Zenaida Niece Trigt Monday, 07/08/22 at 0730. VAD coordinator will accompany pt.   Alyce Pagan RN VAD Coordinator  Office: [phone]  24/7 Pager: [phone]

## 2022-06-27 NOTE — Progress Notes (Addendum)
ANTICOAGULATION CONSULT NOTE  Pharmacy Consult for heparin + warfarin Indication:  LVAD  No Known Allergies  Patient Measurements: Height: 5\' 10"  (177.8 cm) Weight: 84.2 kg (185 lb 10 oz) IBW/kg (Calculated) : 73 Heparin Dosing Weight: 80kg  Vital Signs: Temp: 97.9 F (36.6 C) (07/20 0451) Temp Source: Oral (07/20 0451) BP: 86/70 (07/20 0451) Pulse Rate: 75 (07/20 0451)  Labs: Recent Labs    06/25/22 0500 06/26/22 0400 06/27/22 0450  HGB 10.7* 10.7* 10.0*  HCT 33.4* 32.3* 31.6*  PLT 210 220 205  LABPROT 18.7* 19.5* 19.4*  INR 1.6* 1.7* 1.7*  HEPARINUNFRC <0.10* <0.10* <0.10*  CREATININE 1.39* 1.51* 1.28*     Estimated Creatinine Clearance: 62.6 mL/min (A) (by C-G formula based on SCr of 1.28 mg/dL (H)).   Medical History: Past Medical History:  Diagnosis Date   "    Arthritis    CAD (coronary artery disease)    a. s/p CABG in 11/2019 with LIMA-LAD, SVG-OM1, SVG-PDA and SVG-D1   CHF (congestive heart failure) (HCC)    a. EF < 20% by echo in 11/2019   Essential hypertension    PAF (paroxysmal atrial fibrillation) (HCC)    Type 2 diabetes mellitus (HCC)     Assessment: 1 yoM with HM3 LVAD admitted with ongoing LVAD driveline infection. Pt with planned debridements in OR. Pt is on warfarin PTA with last dose taken 7/3.   Pt s/p OR for infected wound debridement 7/7, 7/13, and again today 7/20. Next debridement planned for 7/31. INR 1.7 today, warfarin held overnight, will restart tonight. Heparin level undetectable as expected with low-dosing heparin drip rate 500 units/hr and not titrating. CBC and LDH stable.   *Home warfarin dose 6mg  MWF, 4mg  AODs  Goal of Therapy:  INR goal 2-2.5 Heparin level < 0.3 units/ml Monitor platelets by anticoagulation protocol: Yes   Plan:  Warfarin 8mg  tonight Restart heparin at 500 units/hr per surgery at 1500 today -Can stop heparin when INR>1.8 Daily INR, heparin level, CBC, LDH  8/31 PharmD.,  BCPS Clinical Pharmacist 06/27/2022 7:15 AM

## 2022-06-27 NOTE — Progress Notes (Signed)
    [facility]   Reason for visit: Follow up on abdominal wound  Interval History: remains afebrile, WBC 8.5.  just back from the OR with Dr. Donata Clay.   Day 16 total antibiotics  Physical Exam: Constitutional:  Vitals:   06/27/22 1015 06/27/22 1031  BP: (!) 129/92 (!) 128/99  Pulse: 71 70  Resp: 19 13  Temp: 97.7 F (36.5 C) 97.7 F (36.5 C)  SpO2: 98% 98%   patient appears in NAD Respiratory: Normal respiratory effort; CTA B GI: soft, nt, nd  Review of Systems: Constitutional: negative for fevers and chills  Lab Results  Component Value Date   WBC 8.5 06/27/2022   HGB 10.0 (L) 06/27/2022   HCT 31.6 (L) 06/27/2022   MCV 87.5 06/27/2022   PLT 205 06/27/2022    Lab Results  Component Value Date   CREATININE 1.28 (H) 06/27/2022   BUN 34 (H) 06/27/2022   NA 137 06/27/2022   K 4.2 06/27/2022   CL 103 06/27/2022   CO2 26 06/27/2022    Lab Results  Component Value Date   ALT 86 (H) 06/12/2022   AST 68 (H) 06/12/2022   ALKPHOS 114 06/12/2022     Microbiology: No results found for this or any previous visit (from the past 240 hour(s)).  Impression/Plan:  1. Drive line infection - s/p debridement and VAC change today.  No findings of infection noted.  He continues on vancomycin and will plan on a 4 week total course of IV vancomycin post debridement 7/13 and then transition back to oral suppressive antibiotics with doxycycline.     2.  Medication monitoring - will continue to monitor labs while inpatient and after discharge.    3.  Elevated CK on daptomycin - this has resolved, last CK wnl.    I will continue to follow intermittently.

## 2022-06-27 NOTE — Anesthesia Preprocedure Evaluation (Addendum)
Anesthesia Evaluation  Patient identified by MRN, date of birth, ID band Patient awake    Reviewed: Allergy & Precautions, NPO status , Patient's Chart, lab work & pertinent test results  Airway Mallampati: II  TM Distance: >3 FB     Dental   Pulmonary sleep apnea , COPD, former smoker,    breath sounds clear to auscultation       Cardiovascular hypertension, + angina + CAD and +CHF   Rhythm:Regular Rate:Normal  LVAD history noted Dr. Chilton Si   Neuro/Psych    GI/Hepatic negative GI ROS, Neg liver ROS,   Endo/Other  diabetesHyperthyroidism   Renal/GU Renal disease     Musculoskeletal   Abdominal   Peds  Hematology   Anesthesia Other Findings   Reproductive/Obstetrics                             Anesthesia Physical Anesthesia Plan  ASA: 4  Anesthesia Plan: General   Post-op Pain Management:    Induction: Intravenous  PONV Risk Score and Plan: 2 and Ondansetron and Treatment may vary due to age or medical condition  Airway Management Planned: Oral ETT  Additional Equipment:   Intra-op Plan:   Post-operative Plan: Possible Post-op intubation/ventilation  Informed Consent: I have reviewed the patients History and Physical, chart, labs and discussed the procedure including the risks, benefits and alternatives for the proposed anesthesia with the patient or authorized representative who has indicated his/her understanding and acceptance.     Dental advisory given  Plan Discussed with: CRNA and Anesthesiologist  Anesthesia Plan Comments:        Anesthesia Quick Evaluation

## 2022-06-27 NOTE — Anesthesia Postprocedure Evaluation (Signed)
Anesthesia Post Note  Patient: Kevin Underwood  Procedure(s) Performed: ABDOMINAL WOUND DEBRIDEMENT, APPLIECATION OF MYRIAD WOUND VAC CHANGE     Patient location during evaluation: ICU Anesthesia Type: General Level of consciousness: awake Pain management: pain level controlled Vital Signs Assessment: post-procedure vital signs reviewed and stable Respiratory status: spontaneous breathing Cardiovascular status: stable Postop Assessment: no apparent nausea or vomiting Anesthetic complications: no   No notable events documented.  Last Vitals:  Vitals:   06/27/22 1000 06/27/22 1015  BP: (!) 124/102 (!) 129/92  Pulse: 67 71  Resp: 10 19  Temp:  36.5 C  SpO2: 95% 98%    Last Pain:  Vitals:   06/27/22 1015  TempSrc:   PainSc: 0-No pain                 Honestii Marton

## 2022-06-28 ENCOUNTER — Encounter (HOSPITAL_COMMUNITY): Payer: Self-pay | Admitting: Cardiothoracic Surgery

## 2022-06-28 DIAGNOSIS — I480 Paroxysmal atrial fibrillation: Secondary | ICD-10-CM

## 2022-06-28 DIAGNOSIS — I2510 Atherosclerotic heart disease of native coronary artery without angina pectoris: Secondary | ICD-10-CM | POA: Diagnosis not present

## 2022-06-28 DIAGNOSIS — I5022 Chronic systolic (congestive) heart failure: Secondary | ICD-10-CM

## 2022-06-28 DIAGNOSIS — T827XXA Infection and inflammatory reaction due to other cardiac and vascular devices, implants and grafts, initial encounter: Secondary | ICD-10-CM | POA: Diagnosis not present

## 2022-06-28 DIAGNOSIS — N179 Acute kidney failure, unspecified: Secondary | ICD-10-CM

## 2022-06-28 LAB — CBC
HCT: 31.4 % — ABNORMAL LOW (ref 39.0–52.0)
Hemoglobin: 10 g/dL — ABNORMAL LOW (ref 13.0–17.0)
MCH: 27.8 pg (ref 26.0–34.0)
MCHC: 31.8 g/dL (ref 30.0–36.0)
MCV: 87.2 fL (ref 80.0–100.0)
Platelets: 233 10*3/uL (ref 150–400)
RBC: 3.6 MIL/uL — ABNORMAL LOW (ref 4.22–5.81)
RDW: 18.6 % — ABNORMAL HIGH (ref 11.5–15.5)
WBC: 11.8 10*3/uL — ABNORMAL HIGH (ref 4.0–10.5)
nRBC: 0 % (ref 0.0–0.2)

## 2022-06-28 LAB — PROTIME-INR
INR: 1.5 — ABNORMAL HIGH (ref 0.8–1.2)
Prothrombin Time: 18.3 seconds — ABNORMAL HIGH (ref 11.4–15.2)

## 2022-06-28 LAB — BASIC METABOLIC PANEL
Anion gap: 10 (ref 5–15)
BUN: 39 mg/dL — ABNORMAL HIGH (ref 8–23)
CO2: 26 mmol/L (ref 22–32)
Calcium: 9.1 mg/dL (ref 8.9–10.3)
Chloride: 99 mmol/L (ref 98–111)
Creatinine, Ser: 1.43 mg/dL — ABNORMAL HIGH (ref 0.61–1.24)
GFR, Estimated: 56 mL/min — ABNORMAL LOW (ref >60)
Glucose, Bld: 138 mg/dL — ABNORMAL HIGH (ref 70–99)
Potassium: 4.6 mmol/L (ref 3.5–5.1)
Sodium: 135 mmol/L (ref 135–145)

## 2022-06-28 LAB — GLUCOSE, CAPILLARY
Glucose-Capillary: 160 mg/dL — ABNORMAL HIGH (ref 70–99)
Glucose-Capillary: 261 mg/dL — ABNORMAL HIGH (ref 70–99)
Glucose-Capillary: 152 mg/dL — ABNORMAL HIGH (ref 70–99)
Glucose-Capillary: 187 mg/dL — ABNORMAL HIGH (ref 70–99)

## 2022-06-28 LAB — LACTATE DEHYDROGENASE: LDH: 145 U/L (ref 98–192)

## 2022-06-28 LAB — HEPARIN LEVEL (UNFRACTIONATED): Heparin Unfractionated: <0.1 IU/mL — ABNORMAL LOW (ref 0.30–0.70)

## 2022-06-28 MED ORDER — WARFARIN SODIUM 5 MG PO TABS
10.0000 mg | ORAL_TABLET | Freq: Once | ORAL | Status: AC
  Administered 2022-06-28: 10 mg via ORAL
  Filled 2022-06-28: qty 2

## 2022-06-28 MED ORDER — HYDRALAZINE HCL 25 MG PO TABS
12.5000 mg | ORAL_TABLET | Freq: Three times a day (TID) | ORAL | Status: DC
  Administered 2022-06-28 – 2022-07-02 (×15): 12.5 mg via ORAL
  Filled 2022-06-28 (×15): qty 1

## 2022-06-28 MED ORDER — HYDRALAZINE HCL 25 MG PO TABS: 25.0000 mg | ORAL_TABLET | Freq: Three times a day (TID) | ORAL | Status: DC

## 2022-06-28 NOTE — TOC CM/SW Note (Signed)
HF TOC CM contacted [employer] rep, [name] to make aware pt has not dc and states she will $0 out his bill. Will need to return wound vac to 6n storage area. CM will reorder wound vac closer to his dc. Pt is billed $18 a day for wound vac. Explained to pt and updated attending. Isidoro Donning RN3 CCM, Heart Failure TOC CM [phone]

## 2022-06-28 NOTE — Progress Notes (Signed)
LVAD Coordinator Rounding Note:  Admitted 06/12/22 to Dr. Alford Highland service due to drive line debridement scheduled with Dr Donata Clay 06/14/22.   S/p driveline site debridement and wound vac on 7/7.  7/10 Had Ramp ECHO. Speed optimized. 7/13 OR to [facility] dressing change.  Back on Heparin drip.  7/20 OR for debridement and VAC dressing change   Pt sitting up in recliner. He has just returned from walking outside. He is enjoying his new room with a window that looks outside over the heart and vascular center. Denies complaints.   Wound vac changed yesterday in OR per Dr Donata Clay. Minimal serosanguinous drainage, with no leaks noted. Will plan to return to OR 07/08/22 at 0730 for wound vac change.   Transitioned from daptomycin to vancomycin due to elevated CK. Vancomycin planned x 6 weeks; end date 07/18/22.   Vital signs: Temp:  97.8 HR: 80 Doppler Pressure: 108 Automatic BP: 115/97 (104) O2 Sat: 95% on RA Wt: 171.7>174.6>178.6>182.9>181>182.3>182.7>186.7>184.5>184.3>186.7>185.6>188.7 lbs   LVAD interrogation reveals:  Speed: 5600 Flow:  4.2 Power:  4.3w PI: 3.4 Alarms: none Events: 15 PI events so far today Hct: 32  Fixed speed:  5600 Low speed limit:  5300  Drive Line:  Wound vac change in OR today per Dr Donata Clay. Suction at 125; no leaks noted; draining serosanguinous fluid. Anchor intact to drive line. Planned wound vac change Monday, 07/08/22 per Dr. Donata Clay.   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 VAD coordinator will accompany pt.   Alyce Pagan RN VAD Coordinator  Office: [phone]  24/7 Pager: [phone]

## 2022-06-28 NOTE — Progress Notes (Signed)
ANTICOAGULATION CONSULT NOTE  Pharmacy Consult for heparin + warfarin Indication:  LVAD  No Known Allergies  Patient Measurements: Height: 5\' 10"  (177.8 cm) Weight: 85.6 kg (188 lb 11.4 oz) IBW/kg (Calculated) : 73 Heparin Dosing Weight: 80kg  Vital Signs: Temp: 97.8 F (36.6 C) (07/21 1059) Temp Source: Oral (07/21 1059) BP: 133/110 (07/21 1059) Pulse Rate: 80 (07/21 0744)  Labs: Recent Labs    06/26/22 0400 06/27/22 0450 06/28/22 0430  HGB 10.7* 10.0* 10.0*  HCT 32.3* 31.6* 31.4*  PLT 220 205 233  LABPROT 19.5* 19.4* 18.3*  INR 1.7* 1.7* 1.5*  HEPARINUNFRC <0.10* <0.10* <0.10*  CREATININE 1.51* 1.28* 1.43*     Estimated Creatinine Clearance: 56 mL/min (A) (by C-G formula based on SCr of 1.43 mg/dL (H)).   Medical History: Past Medical History:  Diagnosis Date   "    Arthritis    CAD (coronary artery disease)    a. s/p CABG in 11/2019 with LIMA-LAD, SVG-OM1, SVG-PDA and SVG-D1   CHF (congestive heart failure) (HCC)    a. EF < 20% by echo in 11/2019   Essential hypertension    PAF (paroxysmal atrial fibrillation) (HCC)    Type 2 diabetes mellitus (HCC)     Assessment: 7 yoM with HM3 LVAD admitted with ongoing LVAD driveline infection. Pt with planned debridements in OR. Pt is on warfarin PTA with last dose taken 7/3.   Pt s/p OR for infected wound debridement 7/7, 7/13, and again today 7/20. Next debridement planned for 7/31.  INR down to 1.5 today, warfarin held 7/19.8/19 Heparin level undetectable as expected with low-dosing heparin drip rate 500 units/hr and not titrating. CBC and LDH stable.   *Home warfarin dose 6mg  MWF, 4mg  AODs  Goal of Therapy:  INR goal 2-2.5 Heparin level < 0.3 units/ml Monitor platelets by anticoagulation protocol: Yes   Plan:  Warfarin 10 mg tonight Continue heparin at 500 units/hr per surgery >Can stop heparin when INR>1.8 Daily INR, heparin level, CBC, LDH  Marland Kitchen PharmD., BCPS Clinical Pharmacist 06/28/2022  11:44 AM

## 2022-06-28 NOTE — Progress Notes (Addendum)
Patient ID: CUSTER SESTITO, male   DOB: 1961/11/25, 61 y.o.   MRN: [id number]   Advanced Heart Failure VAD Team Note  PCP-Cardiologist: Rozann Lesches, MD   Subjective:    S/p driveline site debridement and wound vac on 7/7.  7/10 Had Ramp ECHO. Speed optimized. 7/13 OR to [facility] dressing change.  Back on Heparin drip.  7/20 OR for debridement and VAC dressing change  Went to the OR yesterday. S/p debridement and vac change. Plan to go back to the OR 7/31.   Feels very good. Wound vac still draining. Afebrile.   On heparin/warfarin INR 1.5   SCr downtrending 1.5>>1.4 today  Denies CP or SOB.   LVAD INTERROGATION:  HeartMate III LVAD:   Flow 4.2  liters/min, speed 5600, power 4.2, PI 3.2. VAD interrogated personally. Parameters stable.  Objective:    Vital Signs:   Temp:  [97.7 F (36.5 C)-98.5 F (36.9 C)] 98.1 F (36.7 C) (07/21 0341) Pulse Rate:  [49-79] 76 (07/21 0341) Resp:  [8-19] 15 (07/21 0341) BP: (114-134)/(90-109) 132/97 (07/21 0341) SpO2:  [94 %-100 %] 94 % (07/21 0341) Weight:  [85.6 kg] 85.6 kg (07/21 0342) Last BM Date : 06/27/22 Mean arterial Pressure  90-100 Intake/Output:   Intake/Output Summary (Last 24 hours) at 06/28/2022 0709 Last data filed at 06/28/2022 0341 Gross per 24 hour  Intake 1644.58 ml  Output 1500 ml  Net 144.58 ml     Physical Exam   General:  well appearing. Looks stated age. No respiratory difficulty HEENT: normal Neck: supple. No JVD. Carotids 2+ bilat; no bruits. No lymphadenopathy or thyromegaly appreciated. Cor: VAD hum Lungs: clear Abdomen: soft, nontender, nondistended. No hepatosplenomegaly. No bruits or masses. Good bowel sounds.  LVAD exit site: Driveline and wound vac site look good, no swelling redness or tenderness Extremities: no cyanosis, clubbing, rash, edema  Neuro: alert & oriented x 3, cranial nerves grossly intact. moves all 4 extremities w/o difficulty. Affect pleasant.   Telemetry    NSR 70s  (Personally reviewed)   Labs   Basic Metabolic Panel: Recent Labs  Lab 06/24/22 0500 06/25/22 0500 06/26/22 0400 06/27/22 0450 06/28/22 0430  NA 138 137 138 137 135  K 4.3 4.2 4.2 4.2 4.6  CL 102 103 104 103 99  CO2 26 25 26 26 26   GLUCOSE 167* 132* 153* 93 138*  BUN 31* 31* 35* 34* 39*  CREATININE 2.01* 1.39* 1.51* 1.28* 1.43*  CALCIUM 8.9 8.9 8.7* 8.8* 9.1    Liver Function Tests: No results for input(s): "AST", "ALT", "ALKPHOS", "BILITOT", "PROT", "ALBUMIN" in the last 168 hours.  No results for input(s): "LIPASE", "AMYLASE" in the last 168 hours. No results for input(s): "AMMONIA" in the last 168 hours.  CBC: Recent Labs  Lab 06/24/22 0500 06/25/22 0500 06/26/22 0400 06/27/22 0450 06/28/22 0430  WBC 10.9* 10.7* 11.2* 8.5 11.8*  HGB 10.7* 10.7* 10.7* 10.0* 10.0*  HCT 33.7* 33.4* 32.3* 31.6* 31.4*  MCV 87.1 86.5 86.1 87.5 87.2  PLT 235 210 220 205 233    INR: Recent Labs  Lab 06/24/22 0500 06/25/22 0500 06/26/22 0400 06/27/22 0450 06/28/22 0430  INR 1.5* 1.6* 1.7* 1.7* 1.5*    Other results:   Imaging   No results found.   Medications:     Scheduled Medications:  allopurinol  100 mg Oral Daily   Chlorhexidine Gluconate Cloth  6 each Topical Daily   docusate sodium  200 mg Oral BID   gabapentin  600 mg Oral  BID   guaiFENesin  600 mg Oral BID   hydrALAZINE  12.5 mg Oral TID   insulin aspart  0-15 Units Subcutaneous TID WC   insulin aspart  0-5 Units Subcutaneous QHS   insulin detemir  15 Units Subcutaneous QHS   lactose free nutrition  237 mL Oral TID WC   loratadine  10 mg Oral Daily   methimazole  20 mg Oral BID   metoprolol succinate  25 mg Oral Daily   pantoprazole  40 mg Oral Daily   polyethylene glycol  17 g Oral Daily   rosuvastatin  20 mg Oral Daily   sertraline  50 mg Oral Daily   tamsulosin  0.4 mg Oral Daily   traZODone  150 mg Oral QHS   vancomycin (VANCOCIN) 1,000 mg in sodium chloride 0.9 % 1,000 mL irrigation    Irrigation To OR   Warfarin - Pharmacist Dosing Inpatient   Does not apply q1600    Infusions:  heparin 500 Units/hr (06/28/22 0341)   vancomycin 1,000 mg (06/27/22 2103)    PRN Medications: acetaminophen, albuterol, morphine injection, ondansetron (ZOFRAN) IV, mouth rinse, sorbitol, traMADol   Patient Profile   61 y.o. male with history of HM III LVAD, chronic systolic CHF/ICM, COPD, PAF. 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   Readmitted for driveline infection and surgical debridement/possible wound vac placement.  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 S/p driveline site debridement 6/8. Surgical wound cultures grew MRSA, repeat debridement 6/23. Driveline debridement with wound vac placement on 7/7.  - Transitioned from daptomycin to vancomycin due to elevated CK.  Vancomycin planned x 6 wks, end date 07/18/22.  - 7/13, 7/20 VAC dressing change.  - Vac working well  - Back to OR 7/31 for debridement and Vac change - Continue heparin drip and warfarin. INR 1.5 today. Can stop heparin when INR > 1.8  Hold warfarin today 2.  Chronic systolic CHF: Echo 10/21 with EF 20-25%, mildly decreased RV function. LHC/RHC in 12/21 with patent grafts, low output. Suspect mixed ischemic/nonischemic cardiomyopathy (prior heavy ETOH and drugs as well as CAD).  No ETOH, drugs, smoking since CABG in 12/20. Admitted with cardiogenic shock in 12/21, had placement of Impella 5.5 initially, now s/p Heartmate 3 LVAD on 11/17/20.  Ramp echo 2/23 with speed decreased to 5500 rpm. He would eventually like heart transplant. NYHA class II. Have been allowing MAP to run higher due to recent AKI thought to be due to lowering BP too much in setting of right renal artery stenosis.  - Had Ramp ECHO with speed optimized.  MAP 90-100s. INR 1.5 today on warfarin.+ heparin drip.  LDH stable - Volume status looks good - MAPs 90-100s today.  Increase hydralazine back to 25mg  TID. Watch MAPs closely with renal artery stenosis. Could be correlated w/ pain med administration. Try to avoid narcotics if possible to keep MAP's 90-100 - Continue Toprol XL 25 mg daily.  - Continue warfarin/low dose heparin.  3. AKI: Creatinine up to 4.6 last admission, initially thought this was ATN from low BP but he was never markedly hypotensive and LFTs without any evidence of shock liver. ? Vanc toxicity (troughs were ok). Taken off Entresto and hydralazine. Volume repleted with IVF. In reviewing prior CTA abdomen/pelvis, he has high-grade stenosis of proximal right renal artery. D/w Renal and feel like he may need higher perfusion pressures. SCr 1.4-> 1.5-> 1.4 Let MAPs run 90-100 -  Would consider reassessment of renal artery stenosis and possible treatment once creatinine has improved to baseline.  4.  CAD: S/p CABG 12/20.  LHC pre-VAD with patent grafts, no target for intervention. - Stopped statin with elevated CK => this was in setting of daptomycin.  -CK dow , statin restarted.  5.Atrial fibrillation: Paroxysmal.  S/p Maze and LA appendage clip with CABG in 12/20. In NSR today  - Continue Toprol XL 25 mg daily.  - Off amiodarone with hyperthyroidism. - On Warfarin/heparin.  6.  Type 2 diabetes: Takes glipizide at home.  - SSI  - Continue Levemir 15 units qHS - Diabetes coordinator following 7. 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  8. 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  He missed his endocrinology appt. Has been rescheduled for July 27th. - He is now off amiodarone.   - Continue methimazole 20 mg bid, TSH was mildly elevated.   9.  Gout: On allopurinol.  10. Anemia (chronic blood loss):  - Got 1u RBCs on 7/12 Hgb stable at 10 today No obvious source of bleeding    I reviewed the LVAD parameters from today, and compared the results to the patient's prior recorded data.  No programming changes  were made.  The LVAD is functioning within specified parameters.  The patient performs LVAD self-test daily.  LVAD interrogation was negative for any significant power changes, alarms or PI events/speed drops.  LVAD equipment check completed and is in good working order.  Back-up equipment present.   LVAD education done on emergency procedures and precautions and reviewed exit site care.  Length of Stay: [address], AGACNP-BC 06/28/2022, 7:09 AM  VAD Team --- VAD ISSUES ONLY--- Pager [phone] (7am - 7am)  Advanced Heart Failure Team  Pager [phone] (M-F; 7a - 5p)  Please contact [facility] for night-coverage after hours (5p -7a ) and weekends on [email or web address]  Patient seen and examined with the above-signed Advanced Practice Provider and/or Housestaff. I personally reviewed laboratory data, imaging studies and relevant notes. I independently examined the patient and formulated the important aspects of the plan. I have edited the note to reflect any of my changes or salient points. I have personally discussed the plan with the patient and/or family.  Back to OR yesterday for wound vac change and I&D. Tolerated well. Wound vac working well today. On IV abx. No f/c. No CP or SOB. Appetite ok  On heparin/warfarin INR 1.5   General:  NAD.  HEENT: normal  Neck: supple. JVP not elevated.  Carotids 2+ bilat; no bruits. No lymphadenopathy or thryomegaly appreciated. Cor: LVAD hum.  Lungs: Clear. Abdomen: obese soft, nontender, non-distended. No hepatosplenomegaly. No bruits or masses. Good bowel sounds. + wound vac Extremities: no cyanosis, clubbing, rash. Warm no edema  Neuro: alert & oriented x 3. No focal deficits. Moves all 4 without problem   S/p wound vac change. Site looks good. Remains on heparin/low-dose warfarin. VAD interrogated personally. Parameters stable. Scr stable. Let MAPs run a bit. Has to go back to OR next week.   Arvilla Meres, MD  8:08 AM

## 2022-06-28 NOTE — Progress Notes (Signed)
Pharmacy Antibiotic Note  Kevin Underwood is a 61 y.o. male admitted on 06/12/2022 with LVAD Driveline infection s/p debridement  7/7 , 7/13 and 7/20.Noted patient was on Daptomycin with CK increased up to 1000, pt transitioned back to vancomycin. Scr up slightly today to 1.43 but within normal range for patient (Has been 1.2-1.5 this admission). Last AUC on 7/17-7/18 434 and therapeutic.     Plan: Continue Vancomycin 1000 mg IV every 24 hours  Watch Scr closely  Would continue weekly Vancomycin levels - Next planned 7/24-7/25.    Height: 5\' 10"  (177.8 cm) Weight: 85.6 kg (188 lb 11.4 oz) IBW/kg (Calculated) : 73  Temp (24hrs), Avg:98.1 F (36.7 C), Min:97.7 F (36.5 C), Max:98.5 F (36.9 C)  Recent Labs  Lab 06/24/22 0500 06/24/22 1948 06/25/22 0045 06/25/22 0500 06/26/22 0400 06/27/22 0450 06/28/22 0430  WBC 10.9*  --   --  10.7* 11.2* 8.5 11.8*  CREATININE 2.01*  --   --  1.39* 1.51* 1.28* 1.43*  VANCOTROUGH  --  12*  --   --   --   --   --   VANCOPEAK  --   --  24*  --   --   --   --      Estimated Creatinine Clearance: 56 mL/min (A) (by C-G formula based on SCr of 1.43 mg/dL (H)).    No Known Allergies   06/30/22, PharmD, BCPS, BCIDP Infectious Diseases Clinical Pharmacist Phone: [phone] 06/28/2022 7:57 AM

## 2022-06-28 NOTE — Plan of Care (Signed)
  Problem: Education: Goal: Knowledge of General Education information will improve Description: Including pain rating scale, medication(s)/side effects and non-pharmacologic comfort measures Outcome: Progressing   Problem: Health Behavior/Discharge Planning: Goal: Ability to manage health-related needs will improve Outcome: Progressing   Problem: Clinical Measurements: Goal: Ability to maintain clinical measurements within normal limits will improve Outcome: Progressing Goal: Will remain free from infection Outcome: Progressing Goal: Diagnostic test results will improve Outcome: Progressing Goal: Respiratory complications will improve Outcome: Progressing Goal: Cardiovascular complication will be avoided Outcome: Progressing   Problem: Activity: Goal: Risk for activity intolerance will decrease Outcome: Progressing   Problem: Nutrition: Goal: Adequate nutrition will be maintained Outcome: Progressing   Problem: Coping: Goal: Level of anxiety will decrease Outcome: Progressing   Problem: Elimination: Goal: Will not experience complications related to bowel motility Outcome: Progressing Goal: Will not experience complications related to urinary retention Outcome: Progressing   Problem: Pain Managment: Goal: General experience of comfort will improve Outcome: Progressing   Problem: Safety: Goal: Ability to remain free from injury will improve Outcome: Progressing   Problem: Skin Integrity: Goal: Risk for impaired skin integrity will decrease Outcome: Progressing   Problem: Education: Goal: Ability to describe self-care measures that may prevent or decrease complications (Diabetes Survival Skills Education) will improve Outcome: Progressing Goal: Individualized Educational Video(s) Outcome: Progressing   Problem: Coping: Goal: Ability to adjust to condition or change in health will improve Outcome: Progressing   Problem: Fluid Volume: Goal: Ability to  maintain a balanced intake and output will improve Outcome: Progressing   Problem: Health Behavior/Discharge Planning: Goal: Ability to identify and utilize available resources and services will improve Outcome: Progressing Goal: Ability to manage health-related needs will improve Outcome: Progressing   Problem: Metabolic: Goal: Ability to maintain appropriate glucose levels will improve Outcome: Progressing   Problem: Nutritional: Goal: Maintenance of adequate nutrition will improve Outcome: Progressing Goal: Progress toward achieving an optimal weight will improve Outcome: Progressing   Problem: Skin Integrity: Goal: Risk for impaired skin integrity will decrease Outcome: Progressing   Problem: Tissue Perfusion: Goal: Adequacy of tissue perfusion will improve Outcome: Progressing   Problem: Education: Goal: Ability to demonstrate management of disease process will improve Outcome: Progressing Goal: Ability to verbalize understanding of medication therapies will improve Outcome: Progressing Goal: Individualized Educational Video(s) Outcome: Progressing   Problem: Activity: Goal: Capacity to carry out activities will improve Outcome: Progressing   Problem: Cardiac: Goal: Ability to achieve and maintain adequate cardiopulmonary perfusion will improve Outcome: Progressing   Problem: Education: Goal: Patient will understand all VAD equipment and how it functions Outcome: Progressing Goal: Patient will be able to verbalize current INR target range and antiplatelet therapy for discharge home Outcome: Progressing   Problem: Cardiac: Goal: LVAD will function as expected and patient will experience no clinical alarms Outcome: Progressing   

## 2022-06-29 DIAGNOSIS — I5022 Chronic systolic (congestive) heart failure: Secondary | ICD-10-CM | POA: Diagnosis not present

## 2022-06-29 DIAGNOSIS — I2510 Atherosclerotic heart disease of native coronary artery without angina pectoris: Secondary | ICD-10-CM | POA: Diagnosis not present

## 2022-06-29 DIAGNOSIS — T827XXA Infection and inflammatory reaction due to other cardiac and vascular devices, implants and grafts, initial encounter: Secondary | ICD-10-CM | POA: Diagnosis not present

## 2022-06-29 DIAGNOSIS — N179 Acute kidney failure, unspecified: Secondary | ICD-10-CM | POA: Diagnosis not present

## 2022-06-29 LAB — BASIC METABOLIC PANEL
Anion gap: 8 (ref 5–15)
BUN: 37 mg/dL — ABNORMAL HIGH (ref 8–23)
CO2: 24 mmol/L (ref 22–32)
Calcium: 8.9 mg/dL (ref 8.9–10.3)
Chloride: 101 mmol/L (ref 98–111)
Creatinine, Ser: 1.49 mg/dL — ABNORMAL HIGH (ref 0.61–1.24)
GFR, Estimated: 53 mL/min — ABNORMAL LOW (ref >60)
Glucose, Bld: 133 mg/dL — ABNORMAL HIGH (ref 70–99)
Potassium: 4.3 mmol/L (ref 3.5–5.1)
Sodium: 133 mmol/L — ABNORMAL LOW (ref 135–145)

## 2022-06-29 LAB — ACID FAST CULTURE WITH REFLEXED SENSITIVITIES (MYCOBACTERIA): Acid Fast Culture: NEGATIVE

## 2022-06-29 LAB — CBC
HCT: 32.2 % — ABNORMAL LOW (ref 39.0–52.0)
Hemoglobin: 10.4 g/dL — ABNORMAL LOW (ref 13.0–17.0)
MCH: 28.2 pg (ref 26.0–34.0)
MCHC: 32.3 g/dL (ref 30.0–36.0)
MCV: 87.3 fL (ref 80.0–100.0)
Platelets: 210 10*3/uL (ref 150–400)
RBC: 3.69 MIL/uL — ABNORMAL LOW (ref 4.22–5.81)
RDW: 19 % — ABNORMAL HIGH (ref 11.5–15.5)
WBC: 8.8 10*3/uL (ref 4.0–10.5)
nRBC: 0 % (ref 0.0–0.2)

## 2022-06-29 LAB — GLUCOSE, CAPILLARY
Glucose-Capillary: 95 mg/dL (ref 70–99)
Glucose-Capillary: 253 mg/dL — ABNORMAL HIGH (ref 70–99)
Glucose-Capillary: 332 mg/dL — ABNORMAL HIGH (ref 70–99)
Glucose-Capillary: 159 mg/dL — ABNORMAL HIGH (ref 70–99)

## 2022-06-29 LAB — HEPARIN LEVEL (UNFRACTIONATED): Heparin Unfractionated: <0.1 IU/mL — ABNORMAL LOW (ref 0.30–0.70)

## 2022-06-29 LAB — PROTIME-INR
INR: 1.5 — ABNORMAL HIGH (ref 0.8–1.2)
Prothrombin Time: 17.9 seconds — ABNORMAL HIGH (ref 11.4–15.2)

## 2022-06-29 LAB — LACTATE DEHYDROGENASE: LDH: 134 U/L (ref 98–192)

## 2022-06-29 MED ORDER — WARFARIN SODIUM 7.5 MG PO TABS
7.5000 mg | ORAL_TABLET | Freq: Once | ORAL | Status: AC
Start: 2022-06-29 — End: 2022-06-29
  Administered 2022-06-29: 7.5 mg via ORAL
  Filled 2022-06-29: qty 1

## 2022-06-29 NOTE — Progress Notes (Signed)
ANTICOAGULATION CONSULT NOTE  Pharmacy Consult for heparin + warfarin Indication:  LVAD  No Known Allergies  Patient Measurements: Height: 5\' 10"  (177.8 cm) Weight: 84.5 kg (186 lb 4.6 oz) IBW/kg (Calculated) : 73 Heparin Dosing Weight: 80kg  Vital Signs: Temp: 98.2 F (36.8 C) (07/22 1131) Temp Source: Oral (07/22 1131) BP: 127/93 (07/22 1131) Pulse Rate: 81 (07/22 1131)  Labs: Recent Labs    06/27/22 0450 06/28/22 0430 06/29/22 0350  HGB 10.0* 10.0* 10.4*  HCT 31.6* 31.4* 32.2*  PLT 205 233 210  LABPROT 19.4* 18.3* 17.9*  INR 1.7* 1.5* 1.5*  HEPARINUNFRC <0.10* <0.10* <0.10*  CREATININE 1.28* 1.43* 1.49*     Estimated Creatinine Clearance: 53.8 mL/min (A) (by C-G formula based on SCr of 1.49 mg/dL (H)).   Medical History: Past Medical History:  Diagnosis Date   "    Arthritis    CAD (coronary artery disease)    a. s/p CABG in 11/2019 with LIMA-LAD, SVG-OM1, SVG-PDA and SVG-D1   CHF (congestive heart failure) (HCC)    a. EF < 20% by echo in 11/2019   Essential hypertension    PAF (paroxysmal atrial fibrillation) (HCC)    Type 2 diabetes mellitus (HCC)     Assessment: 72 yoM with HM3 LVAD admitted with ongoing LVAD driveline infection. Pt with planned debridements in OR. Pt is on warfarin PTA with last dose taken 7/3.   Pt s/p OR for infected wound debridement 7/7, 7/13, and again today 7/20. Next debridement planned for 7/31. Per discussion with MD, will keep warfarin dose around 1.4-1.7 until debridement next week.  INR stable at 1.5 today, warfarin held 7/19. Heparin level undetectable as expected with low-dosing heparin drip rate 500 units/hr and not titrating. CBC and LDH stable.   *Home warfarin dose 6mg  MWF, 4mg  AODs  Goal of Therapy:  INR goal 2-2.5, but 1.4-1.7 until OR 7/31  Heparin level < 0.3 units/ml Monitor platelets by anticoagulation protocol: Yes   Plan:  Warfarin 7.5 mg tonight Continue heparin at 500 units/hr per surgery Can  stop heparin when INR>1.8 Daily INR, heparin level, CBC, LDH  Thank you for allowing pharmacy to participate in this patient's care.  , PharmD PGY1 Pharmacy Resident 06/29/2022 1:11 PM Check [email or web address] for unit specific pharmacy number

## 2022-06-29 NOTE — Progress Notes (Signed)
2 Days Post-Op Procedure(s) (LRB): ABDOMINAL WOUND DEBRIDEMENT, APPLICATION OF MYRIAD (N/A) WOUND VAC CHANGE (N/A) Subjective: Wound vac system functioning well Plan cont iv vanco and VAC with recent application of tissue matrix Myriad. Will need OR change out of VAC and re-application of tissue matrix 7-31. Ok for INR 2.0-2.4 as it does not appear the wound will need muscle flap closure from the improvement noted at last debridement.  Objective: Vital signs in last 24 hours: Temp:  [97.6 F (36.4 C)-98.2 F (36.8 C)] 98.2 F (36.8 C) (07/22 1131) Pulse Rate:  [72-88] 81 (07/22 1131) Cardiac Rhythm: Normal sinus rhythm (07/21 1900) Resp:  [12-19] 18 (07/22 1131) BP: (102-158)/(79-120) 127/93 (07/22 1131) SpO2:  [94 %-98 %] 94 % (07/22 1131) Weight:  [84.5 kg] 84.5 kg (07/22 0339)  Hemodynamic parameters for last 24 hours:  stable  Intake/Output from previous day: 07/21 0701 - 07/22 0700 In: 919.4 [P.O.:600; I.V.:119.4; IV Piggyback:200] Out: 1810 [Urine:1800; Drains:10] Intake/Output this shift: Total I/O In: 260.9 [P.O.:240; I.V.:20.9] Out: 602 [Urine:600; Stool:2]  EXAM Wound VAC compressed w/o leak Normal VAD hum  Lab Results: Recent Labs    06/28/22 0430 06/29/22 0350  WBC 11.8* 8.8  HGB 10.0* 10.4*  HCT 31.4* 32.2*  PLT 233 210   BMET:  Recent Labs    06/28/22 0430 06/29/22 0350  NA 135 133*  K 4.6 4.3  CL 99 101  CO2 26 24  GLUCOSE 138* 133*  BUN 39* 37*  CREATININE 1.43* 1.49*  CALCIUM 9.1 8.9    PT/INR:  Recent Labs    06/29/22 0350  LABPROT 17.9*  INR 1.5*   ABG    Component Value Date/Time   PHART 7.508 (H) 11/20/2020 0438   HCO3 24.8 09/09/2021 0313   TCO2 29 11/20/2020 0438   ACIDBASEDEF 6.0 (H) 11/24/2019 1718   O2SAT 63.4 09/09/2021 0313   CBG (last 3)  Recent Labs    06/28/22 2057 06/29/22 0611 06/29/22 1132  GLUCAP 187* 95 253*    Assessment/Plan: S/P Procedure(s) (LRB): ABDOMINAL WOUND DEBRIDEMENT, APPLICATION OF  MYRIAD (N/A) WOUND VAC CHANGE (N/A) Plan- cont iv vanco and wound vac Rx until 7-31 then re-assess in OR   LOS: 17 days    Kevin Underwood 06/29/2022

## 2022-06-29 NOTE — Progress Notes (Signed)
Patient ID: Kevin Underwood, male   DOB: [birth year], 61 y.o.   MRN: [id number]   Advanced Heart Failure VAD Team Note  PCP-Cardiologist: Nona Dell, MD   Subjective:    S/p driveline site debridement and wound vac on 7/7.  7/10 Had Ramp ECHO. Speed optimized. 7/13 OR to [facility] dressing change.  Back on Heparin drip.  7/20 OR for debridement and VAC dressing change  Remains on IV abx. Wound vac with mild drainage. No fevers or chills. Remains on heparin/warfarin. No bleeding   LVAD INTERROGATION:  HeartMate III LVAD:   Flow 4.4  liters/min, speed 5600, power 4.0, PI 3.1. VAD interrogated personally. Parameters stable.   Objective:    Vital Signs:   Temp:  [97.6 F (36.4 C)-98.1 F (36.7 C)] 98 F (36.7 C) (07/22 0750) Pulse Rate:  [72-88] 88 (07/22 0750) Resp:  [12-19] 18 (07/22 0750) BP: (102-158)/(79-120) 128/83 (07/22 0750) SpO2:  [94 %-98 %] 95 % (07/22 0750) Weight:  [84.5 kg] 84.5 kg (07/22 0339) Last BM Date : 06/27/22 Mean arterial Pressure  80-90s Intake/Output:   Intake/Output Summary (Last 24 hours) at 06/29/2022 0901 Last data filed at 06/29/2022 0750 Gross per 24 hour  Intake 1180.33 ml  Output 2011 ml  Net -830.67 ml      Physical Exam   General:  NAD.  HEENT: normal  Neck: supple. JVP not elevated.  Carotids 2+ bilat; no bruits. No lymphadenopathy or thryomegaly appreciated. Cor: LVAD hum.  Lungs: Clear. Abdomen: obese soft, nontender, non-distended. No hepatosplenomegaly. No bruits or masses. Good bowel sounds. Wound vac in place Extremities: no cyanosis, clubbing, rash. Warm no edema  Neuro: alert & oriented x 3. No focal deficits. Moves all 4 without problem     Telemetry    NSR 70-80s Personally reviewed   Labs   Basic Metabolic Panel: Recent Labs  Lab 06/25/22 0500 06/26/22 0400 06/27/22 0450 06/28/22 0430 06/29/22 0350  NA 137 138 137 135 133*  K 4.2 4.2 4.2 4.6 4.3  CL 103 104 103 99 101  CO2 25 26 26 26 24   GLUCOSE  132* 153* 93 138* 133*  BUN 31* 35* 34* 39* 37*  CREATININE 1.39* 1.51* 1.28* 1.43* 1.49*  CALCIUM 8.9 8.7* 8.8* 9.1 8.9     Liver Function Tests: No results for input(s): "AST", "ALT", "ALKPHOS", "BILITOT", "PROT", "ALBUMIN" in the last 168 hours.  No results for input(s): "LIPASE", "AMYLASE" in the last 168 hours. No results for input(s): "AMMONIA" in the last 168 hours.  CBC: Recent Labs  Lab 06/25/22 0500 06/26/22 0400 06/27/22 0450 06/28/22 0430 06/29/22 0350  WBC 10.7* 11.2* 8.5 11.8* 8.8  HGB 10.7* 10.7* 10.0* 10.0* 10.4*  HCT 33.4* 32.3* 31.6* 31.4* 32.2*  MCV 86.5 86.1 87.5 87.2 87.3  PLT 210 220 205 233 210     INR: Recent Labs  Lab 06/25/22 0500 06/26/22 0400 06/27/22 0450 06/28/22 0430 06/29/22 0350  INR 1.6* 1.7* 1.7* 1.5* 1.5*     Other results:   Imaging   No results found.   Medications:     Scheduled Medications:  allopurinol  100 mg Oral Daily   Chlorhexidine Gluconate Cloth  6 each Topical Daily   docusate sodium  200 mg Oral BID   gabapentin  600 mg Oral BID   guaiFENesin  600 mg Oral BID   hydrALAZINE  12.5 mg Oral TID   insulin aspart  0-15 Units Subcutaneous TID WC   insulin aspart  0-5 Units Subcutaneous  QHS   insulin detemir  15 Units Subcutaneous QHS   lactose free nutrition  237 mL Oral TID WC   loratadine  10 mg Oral Daily   methimazole  20 mg Oral BID   metoprolol succinate  25 mg Oral Daily   pantoprazole  40 mg Oral Daily   polyethylene glycol  17 g Oral Daily   rosuvastatin  20 mg Oral Daily   sertraline  50 mg Oral Daily   tamsulosin  0.4 mg Oral Daily   traZODone  150 mg Oral QHS   Warfarin - Pharmacist Dosing Inpatient   Does not apply q1600    Infusions:  heparin 500 Units/hr (06/29/22 0750)   vancomycin 1,000 mg (06/28/22 2101)    PRN Medications: acetaminophen, albuterol, morphine injection, ondansetron (ZOFRAN) IV, mouth rinse, sorbitol, traMADol   Patient Profile   61 y.o. male with history  of HM III LVAD, chronic systolic CHF/ICM, COPD, PAF. 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   Readmitted for driveline infection and surgical debridement/possible wound vac placement.  Assessment/Plan:    1. Driveline infection: Recent driveline infection [id number]. S/p driveline site debridement 6/8. Surgical wound cultures grew MRSA, repeat debridement 6/23. Driveline debridement with wound vac placement on 7/7.  - Transitioned from daptomycin to vancomycin due to elevated CK.  Vancomycin planned x 6 wks, end date 07/18/22.  - 7/13, 7/20 VAC dressing change.  - VAC working well. Mild drainage - Back to OR 7/31 for debridement and Vac change - Continue heparin drip and warfarin. INR 1.5 today. Keep INR 1.4-1.7 range as he has to go back to OR 2.  Chronic systolic CHF: Echo [id number] with EF 20-25%, mildly decreased RV function. LHC/RHC in 12/21 with patent grafts, low output. Suspect mixed ischemic/nonischemic cardiomyopathy (prior heavy ETOH and drugs as well as CAD).  No ETOH, drugs, smoking since CABG in 12/20. Admitted with cardiogenic shock in 12/21, had placement of Impella 5.5 initially, now s/p Heartmate 3 LVAD on 11/17/20.  Ramp echo 2/23 with speed decreased to 5500 rpm. He would eventually like heart transplant. NYHA class II. Have been allowing MAP to run higher due to recent AKI thought to be due to lowering BP too much in setting of right renal artery stenosis.  - Had Ramp ECHO with speed optimized.  - INR 1.5 today on warfarin.+ heparin drip.  LDH stable. Warfarin dosing d/w PharmD - Volume status looks ok  - MAPs 80-90s today. Goal MAP 90-100 with RAS. Can stop hydral as needed - Continue Toprol XL 25 mg daily.  - Continue warfarin/low dose heparin.  3. AKI: Creatinine up to 4.6 last admission, initially thought this was ATN from low BP but he was never markedly hypotensive and LFTs without any evidence of shock liver. ? Vanc toxicity (troughs  were ok). Taken off Entresto and hydralazine. Volume repleted with IVF. In reviewing prior CTA abdomen/pelvis, he has high-grade stenosis of proximal right renal artery. D/w Renal and feel like he may need higher perfusion pressures. Scr now stable 1.4-1.5 Let MAPs run 90-100 - Would consider reassessment of renal artery stenosis and possible treatment once creatinine has improved to baseline.  4.  CAD: S/p CABG 12/20.  LHC pre-VAD with patent grafts, no target for intervention. - Stopped statin with elevated CK => this was in setting of daptomycin.  - CKs normalized , statin restarted.  5.Atrial fibrillation: Paroxysmal.  S/p Maze and LA appendage clip with CABG in 12/20.  In NSR today  - Continue Toprol XL 25 mg daily.  - Off amiodarone with hyperthyroidism. - On Warfarin/heparin.  6.  Type 2 diabetes: Takes glipizide at home.  - SSI  - Continue Levemir 15 units qHS - Diabetes coordinator following 7. 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  8. 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  He missed his endocrinology appt. Has been rescheduled for July 27th. - He is now off amiodarone.   - Continue methimazole 20 mg bid, TSH was mildly elevated.   9.  Gout: On allopurinol.  10. Anemia (chronic blood loss):  - Got 1u RBCs on 7/12 Hgb stable at 10.4 today    I reviewed the LVAD parameters from today, and compared the results to the patient's prior recorded data.  No programming changes were made.  The LVAD is functioning within specified parameters.  The patient performs LVAD self-test daily.  LVAD interrogation was negative for any significant power changes, alarms or PI events/speed drops.  LVAD equipment check completed and is in good working order.  Back-up equipment present.   LVAD education done on emergency procedures and precautions and reviewed exit site care.  Length of Stay: 17 Arvilla Meres, MD  9:07 AM  VAD Team --- VAD ISSUES  ONLY--- Pager [phone] (7am - 7am)  Advanced Heart Failure Team  Pager [phone] (M-F; 7a - 5p)  Please contact [facility] for night-coverage after hours (5p -7a ) and weekends on [email or web address]

## 2022-06-30 ENCOUNTER — Inpatient Hospital Stay (HOSPITAL_COMMUNITY): Payer: 59

## 2022-06-30 DIAGNOSIS — T827XXA Infection and inflammatory reaction due to other cardiac and vascular devices, implants and grafts, initial encounter: Secondary | ICD-10-CM | POA: Diagnosis not present

## 2022-06-30 DIAGNOSIS — N179 Acute kidney failure, unspecified: Secondary | ICD-10-CM | POA: Diagnosis not present

## 2022-06-30 DIAGNOSIS — I2510 Atherosclerotic heart disease of native coronary artery without angina pectoris: Secondary | ICD-10-CM | POA: Diagnosis not present

## 2022-06-30 DIAGNOSIS — I5022 Chronic systolic (congestive) heart failure: Secondary | ICD-10-CM | POA: Diagnosis not present

## 2022-06-30 LAB — BASIC METABOLIC PANEL
Anion gap: 8 (ref 5–15)
BUN: 32 mg/dL — ABNORMAL HIGH (ref 8–23)
CO2: 25 mmol/L (ref 22–32)
Calcium: 9 mg/dL (ref 8.9–10.3)
Chloride: 106 mmol/L (ref 98–111)
Creatinine, Ser: 1.52 mg/dL — ABNORMAL HIGH (ref 0.61–1.24)
GFR, Estimated: 52 mL/min — ABNORMAL LOW (ref >60)
Glucose, Bld: 131 mg/dL — ABNORMAL HIGH (ref 70–99)
Potassium: 4.4 mmol/L (ref 3.5–5.1)
Sodium: 139 mmol/L (ref 135–145)

## 2022-06-30 LAB — CBC
HCT: 32.7 % — ABNORMAL LOW (ref 39.0–52.0)
Hemoglobin: 10.6 g/dL — ABNORMAL LOW (ref 13.0–17.0)
MCH: 28.3 pg (ref 26.0–34.0)
MCHC: 32.4 g/dL (ref 30.0–36.0)
MCV: 87.4 fL (ref 80.0–100.0)
Platelets: 216 10*3/uL (ref 150–400)
RBC: 3.74 MIL/uL — ABNORMAL LOW (ref 4.22–5.81)
RDW: 18.9 % — ABNORMAL HIGH (ref 11.5–15.5)
WBC: 9.3 10*3/uL (ref 4.0–10.5)
nRBC: 0 % (ref 0.0–0.2)

## 2022-06-30 LAB — PROTIME-INR
INR: 1.6 — ABNORMAL HIGH (ref 0.8–1.2)
Prothrombin Time: 18.5 seconds — ABNORMAL HIGH (ref 11.4–15.2)

## 2022-06-30 LAB — LACTATE DEHYDROGENASE: LDH: 145 U/L (ref 98–192)

## 2022-06-30 LAB — GLUCOSE, CAPILLARY
Glucose-Capillary: 130 mg/dL — ABNORMAL HIGH (ref 70–99)
Glucose-Capillary: 172 mg/dL — ABNORMAL HIGH (ref 70–99)
Glucose-Capillary: 280 mg/dL — ABNORMAL HIGH (ref 70–99)
Glucose-Capillary: 152 mg/dL — ABNORMAL HIGH (ref 70–99)

## 2022-06-30 LAB — HEPARIN LEVEL (UNFRACTIONATED): Heparin Unfractionated: <0.1 IU/mL — ABNORMAL LOW (ref 0.30–0.70)

## 2022-06-30 MED ORDER — WARFARIN SODIUM 4 MG PO TABS
4.0000 mg | ORAL_TABLET | Freq: Once | ORAL | Status: AC
  Administered 2022-06-30: 4 mg via ORAL
  Filled 2022-06-30: qty 1

## 2022-06-30 NOTE — Progress Notes (Signed)
ANTICOAGULATION CONSULT NOTE  Pharmacy Consult for heparin + warfarin Indication:  LVAD  No Known Allergies  Patient Measurements: Height: 5\' 10"  (177.8 cm) Weight: 84.7 kg (186 lb 11.7 oz) IBW/kg (Calculated) : 73 Heparin Dosing Weight: 80kg  Vital Signs: Temp: 97.6 F (36.4 C) (07/23 0749) Temp Source: Oral (07/23 0749) BP: 122/98 (07/23 0749) Pulse Rate: 76 (07/23 0749)  Labs: Recent Labs    06/28/22 0430 06/29/22 0350 06/30/22 0604  HGB 10.0* 10.4* 10.6*  HCT 31.4* 32.2* 32.7*  PLT 233 210 216  LABPROT 18.3* 17.9* 18.5*  INR 1.5* 1.5* 1.6*  HEPARINUNFRC <0.10* <0.10* <0.10*  CREATININE 1.43* 1.49* 1.52*     Estimated Creatinine Clearance: 52.7 mL/min (A) (by C-G formula based on SCr of 1.52 mg/dL (H)).   Medical History: Past Medical History:  Diagnosis Date   "    Arthritis    CAD (coronary artery disease)    a. s/p CABG in 11/2019 with LIMA-LAD, SVG-OM1, SVG-PDA and SVG-D1   CHF (congestive heart failure) (HCC)    a. EF < 20% by echo in 11/2019   Essential hypertension    PAF (paroxysmal atrial fibrillation) (HCC)    Type 2 diabetes mellitus (HCC)     Assessment: 40 yoM with HM3 LVAD admitted with ongoing LVAD driveline infection. Pt with planned debridements in OR. Pt is on warfarin PTA with last dose taken 7/3.   Pt s/p OR for infected wound debridement 7/7, 7/13, and again today 7/20. Next debridement planned for 7/31. Per discussion with MD, will keep warfarin dose around 1.4-1.7 until debridement next week.  INR slightly increased to 1.6 today after 7.5 mg dose, warfarin held 7/19. Heparin level undetectable as expected with low-dosing heparin drip rate 500 units/hr and not titrating. CBC and LDH stable. Will dose warfarin per home regimen today  *Home warfarin dose 6mg  MWF, 4mg  AODs  Goal of Therapy:  INR goal 2-2.5, but 1.4-1.7 until OR 7/31  Heparin level < 0.3 units/ml Monitor platelets by anticoagulation protocol: Yes   Plan:   Warfarin 4 mg tonight Continue heparin at 500 units/hr per surgery Can stop heparin when INR>1.8 Daily INR, heparin level, CBC, LDH  Thank you for allowing pharmacy to participate in this patient's care.  , PharmD PGY1 Pharmacy Resident 06/30/2022 9:24 AM Check [email or web address] for unit specific pharmacy number

## 2022-06-30 NOTE — Progress Notes (Signed)
3 Days Post-Op Procedure(s) (LRB): ABDOMINAL WOUND DEBRIDEMENT, APPLICATION OF MYRIAD (N/A) WOUND VAC CHANGE (N/A) Subjective: Patient feels well-no epigastric abdominal wall pain Wound VAC system working well without leak   Objective: Vital signs in last 24 hours: Temp:  [97.6 F (36.4 C)-98.5 F (36.9 C)] 98.2 F (36.8 C) (07/23 1103) Pulse Rate:  [76-92] 87 (07/23 1103) Cardiac Rhythm: Heart block (07/23 0735) Resp:  [11-22] 18 (07/23 1103) BP: (101-135)/(84-99) 112/85 (07/23 1103) SpO2:  [95 %] 95 % (07/23 0045) Weight:  [84.7 kg] 84.7 kg (07/23 0500)  Hemodynamic parameters for last 24 hours:   Stable Intake/Output from previous day: 07/22 0701 - 07/23 0700 In: 1124.3 [P.O.:845; I.V.:79.3; IV Piggyback:200] Out: 1552 [Urine:1550; Stool:2] Intake/Output this shift: No intake/output data recorded.  Exam Wound VAC sponge compressed, system intact and functioning well Abdominal wall nontender Normal VAD hum  Lab Results: Recent Labs    06/29/22 0350 06/30/22 0604  WBC 8.8 9.3  HGB 10.4* 10.6*  HCT 32.2* 32.7*  PLT 210 216   BMET:  Recent Labs    06/29/22 0350 06/30/22 0604  NA 133* 139  K 4.3 4.4  CL 101 106  CO2 24 25  GLUCOSE 133* 131*  BUN 37* 32*  CREATININE 1.49* 1.52*  CALCIUM 8.9 9.0    PT/INR:  Recent Labs    06/30/22 0604  LABPROT 18.5*  INR 1.6*   ABG    Component Value Date/Time   PHART 7.508 (H) 11/20/2020 0438   HCO3 24.8 09/09/2021 0313   TCO2 29 11/20/2020 0438   ACIDBASEDEF 6.0 (H) 11/24/2019 1718   O2SAT 63.4 09/09/2021 0313   CBG (last 3)  Recent Labs    06/29/22 2132 06/30/22 0651 06/30/22 1101  GLUCAP 159* 130* 172*    Assessment/Plan: S/P Procedure(s) (LRB): ABDOMINAL WOUND DEBRIDEMENT, APPLICATION OF MYRIAD (N/A) WOUND VAC CHANGE (N/A) Continue wound VAC care and IV vancomycin Wound VAC sponge change plan for 7-31  LOS: 18 days    Kevin Underwood 06/30/2022

## 2022-06-30 NOTE — Progress Notes (Signed)
Patient ID: Kevin Underwood, male   DOB: 11/11/1961, 61 y.o.   MRN: [id number]   Advanced Heart Failure VAD Team Note  PCP-Cardiologist: Rozann Lesches, MD   Subjective:    S/p driveline site debridement and wound vac on 7/7.  7/10 Had Ramp ECHO. Speed optimized. 7/13 OR to [facility] dressing change.  Back on Heparin drip.  7/20 OR for debridement and VAC dressing change  Remains on IV abx. Wound vac stable. Denies ab pain, fevers, chills.    LVAD INTERROGATION:  HeartMate III LVAD:   Flow 4.0  liters/min, speed 5600, power 4.0  PI 2.9. VAD interrogated personally. Parameters stable.   Objective:    Vital Signs:   Temp:  [97.6 F (36.4 C)-98.5 F (36.9 C)] 97.6 F (36.4 C) (07/23 0749) Pulse Rate:  [76-92] 76 (07/23 0749) Resp:  [11-22] 18 (07/23 0749) BP: (101-135)/(84-99) 122/98 (07/23 0749) SpO2:  [94 %-95 %] 95 % (07/23 0045) Weight:  [84.7 kg] 84.7 kg (07/23 0500) Last BM Date : 06/29/22 Mean arterial Pressure  80-90s Intake/Output:   Intake/Output Summary (Last 24 hours) at 06/30/2022 0911 Last data filed at 06/30/2022 0500 Gross per 24 hour  Intake 863.35 ml  Output 1351 ml  Net -487.65 ml      Physical Exam   General:  NAD.  HEENT: normal  Neck: supple. JVP not elevated.  Carotids 2+ bilat; no bruits. No lymphadenopathy or thryomegaly appreciated. Cor: LVAD hum.  Lungs: Clear. Abdomen: obese soft, nontender, non-distended. No hepatosplenomegaly. No bruits or masses. Good bowel sounds. Wound vac looks good Extremities: no cyanosis, clubbing, rash. Warm no edema  Neuro: alert & oriented x 3. No focal deficits. Moves all 4 without problem    Telemetry    NSR  70-80s Personally reviewed   Labs   Basic Metabolic Panel: Recent Labs  Lab 06/26/22 0400 06/27/22 0450 06/28/22 0430 06/29/22 0350 06/30/22 0604  NA 138 137 135 133* 139  K 4.2 4.2 4.6 4.3 4.4  CL 104 103 99 101 106  CO2 26 26 26 24 25   GLUCOSE 153* 93 138* 133* 131*  BUN 35* 34* 39*  37* 32*  CREATININE 1.51* 1.28* 1.43* 1.49* 1.52*  CALCIUM 8.7* 8.8* 9.1 8.9 9.0     Liver Function Tests: No results for input(s): "AST", "ALT", "ALKPHOS", "BILITOT", "PROT", "ALBUMIN" in the last 168 hours.  No results for input(s): "LIPASE", "AMYLASE" in the last 168 hours. No results for input(s): "AMMONIA" in the last 168 hours.  CBC: Recent Labs  Lab 06/26/22 0400 06/27/22 0450 06/28/22 0430 06/29/22 0350 06/30/22 0604  WBC 11.2* 8.5 11.8* 8.8 9.3  HGB 10.7* 10.0* 10.0* 10.4* 10.6*  HCT 32.3* 31.6* 31.4* 32.2* 32.7*  MCV 86.1 87.5 87.2 87.3 87.4  PLT 220 205 233 210 216     INR: Recent Labs  Lab 06/26/22 0400 06/27/22 0450 06/28/22 0430 06/29/22 0350 06/30/22 0604  INR 1.7* 1.7* 1.5* 1.5* 1.6*     Other results:   Imaging   DG CHEST PORT 1 VIEW  Result Date: 06/30/2022 CLINICAL DATA:  Status post PICC line placement. EXAM: PORTABLE CHEST 1 VIEW COMPARISON:  06/13/2022 FINDINGS: The right arm PICC line tip terminates within the mid SVC, unchanged from previous exam. Previous median sternotomy and CABG procedure. LVAD device is again noted projecting over the left ventricular apex. Left atrial clip in place. No pleural effusion or edema. No airspace opacities. IMPRESSION: 1. No active cardiopulmonary abnormalities. 2. Right arm PICC line tip terminates  within the mid SVC. Electronically Signed   By: Signa Kell M.D.   On: 06/30/2022 07:47     Medications:     Scheduled Medications:  allopurinol  100 mg Oral Daily   Chlorhexidine Gluconate Cloth  6 each Topical Daily   docusate sodium  200 mg Oral BID   gabapentin  600 mg Oral BID   guaiFENesin  600 mg Oral BID   hydrALAZINE  12.5 mg Oral TID   insulin aspart  0-15 Units Subcutaneous TID WC   insulin aspart  0-5 Units Subcutaneous QHS   insulin detemir  15 Units Subcutaneous QHS   lactose free nutrition  237 mL Oral TID WC   loratadine  10 mg Oral Daily   methimazole  20 mg Oral BID   metoprolol  succinate  25 mg Oral Daily   pantoprazole  40 mg Oral Daily   polyethylene glycol  17 g Oral Daily   rosuvastatin  20 mg Oral Daily   sertraline  50 mg Oral Daily   tamsulosin  0.4 mg Oral Daily   traZODone  150 mg Oral QHS   Warfarin - Pharmacist Dosing Inpatient   Does not apply q1600    Infusions:  heparin 500 Units/hr (06/29/22 1945)   vancomycin 1,000 mg (06/29/22 2004)    PRN Medications: acetaminophen, albuterol, morphine injection, ondansetron (ZOFRAN) IV, mouth rinse, sorbitol, traMADol   Patient Profile   61 y.o. male with history of HM III LVAD, chronic systolic CHF/ICM, COPD, PAF. 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   Readmitted for driveline infection and surgical debridement/possible wound vac placement.  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 S/p driveline site debridement 6/8. Surgical wound cultures grew MRSA, repeat debridement 6/23. Driveline debridement with wound vac placement on 7/7.  - Transitioned from daptomycin to vancomycin due to elevated CK.  Vancomycin planned x 6 wks, end date 07/18/22.  - 7/13, 7/20 VAC dressing change.  - Wound VAC working well - Back to OR 7/31 for debridement and Vac change - Continue heparin drip and warfarin. INR 1.6 today. Keep INR 1.4-1.7 range as he has to go back to OR 2.  Chronic systolic CHF: Echo 10/21 with EF 20-25%, mildly decreased RV function. LHC/RHC in 12/21 with patent grafts, low output. Suspect mixed ischemic/nonischemic cardiomyopathy (prior heavy ETOH and drugs as well as CAD).  No ETOH, drugs, smoking since CABG in 12/20. Admitted with cardiogenic shock in 12/21, had placement of Impella 5.5 initially, now s/p Heartmate 3 LVAD on 11/17/20.  Ramp echo 2/23 with speed decreased to 5500 rpm. He would eventually like heart transplant. NYHA class II. Have been allowing MAP to run higher due to recent AKI thought to be due to lowering  BP too much in setting of right renal artery stenosis.  - Had Ramp ECHO with speed optimized.  - INR 1.6 today on warfarin.+ heparin drip.  LDH stable. Discussed dosing with PharmD personally. - Volume status ok - MAPs 90-105s today. Goal MAP 90-100 with RAS. Can increase hydral as needed - Continue Toprol XL 25 mg daily.  - Continue warfarin/low dose heparin.  3. AKI: Creatinine up to 4.6 last admission, initially thought this was ATN from low BP but he was never markedly hypotensive and LFTs without any evidence of shock liver. ? Vanc toxicity (troughs were ok). Taken off Entresto and hydralazine. Volume repleted with IVF. In reviewing prior CTA abdomen/pelvis, he has  high-grade stenosis of proximal right renal artery. D/w Renal and feel like he may need higher perfusion pressures. Scr now stable 1.4-1.5 Let MAPs run 90-100. - Would consider reassessment of renal artery stenosis and possible treatment once creatinine has improved to baseline.  4.  CAD: S/p CABG 12/20.  LHC pre-VAD with patent grafts, no target for intervention. - Stopped statin with elevated CK => this was in setting of daptomycin.  - CKs normalized , statin restarted.  5.Atrial fibrillation: Paroxysmal.  S/p Maze and LA appendage clip with CABG in 12/20. In NSR today  - Continue Toprol XL 25 mg daily.  - Off amiodarone with hyperthyroidism. - On Warfarin/heparin.  6.  Type 2 diabetes: Takes glipizide at home.  - SSI  - Continue Levemir 15 units qHS - Diabetes coordinator following 7. 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  8. 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  He missed his endocrinology appt. Has been rescheduled for July 27th. - He is now off amiodarone.   - Continue methimazole 20 mg bid, TSH was mildly elevated.   9.  Gout: On allopurinol.  10. Anemia (chronic blood loss):  - Got 1u RBCs on 7/12 Hgb stable at 10.5 today    I reviewed the LVAD parameters from  today, and compared the results to the patient's prior recorded data.  No programming changes were made.  The LVAD is functioning within specified parameters.  The patient performs LVAD self-test daily.  LVAD interrogation was negative for any significant power changes, alarms or PI events/speed drops.  LVAD equipment check completed and is in good working order.  Back-up equipment present.   LVAD education done on emergency procedures and precautions and reviewed exit site care.  Length of Stay: 61 Arvilla Meres, MD  9:11 AM  VAD Team --- VAD ISSUES ONLY--- Pager [phone] (7am - 7am)  Advanced Heart Failure Team  Pager [phone] (M-F; 7a - 5p)  Please contact [facility] for night-coverage after hours (5p -7a ) and weekends on [email or web address]

## 2022-07-01 DIAGNOSIS — T827XXA Infection and inflammatory reaction due to other cardiac and vascular devices, implants and grafts, initial encounter: Secondary | ICD-10-CM | POA: Diagnosis not present

## 2022-07-01 DIAGNOSIS — Z95811 Presence of heart assist device: Secondary | ICD-10-CM | POA: Diagnosis not present

## 2022-07-01 LAB — BASIC METABOLIC PANEL
Anion gap: 5 (ref 5–15)
BUN: 29 mg/dL — ABNORMAL HIGH (ref 8–23)
CO2: 26 mmol/L (ref 22–32)
Calcium: 8.9 mg/dL (ref 8.9–10.3)
Chloride: 106 mmol/L (ref 98–111)
Creatinine, Ser: 1.46 mg/dL — ABNORMAL HIGH (ref 0.61–1.24)
GFR, Estimated: 54 mL/min — ABNORMAL LOW (ref >60)
Glucose, Bld: 150 mg/dL — ABNORMAL HIGH (ref 70–99)
Potassium: 4.3 mmol/L (ref 3.5–5.1)
Sodium: 137 mmol/L (ref 135–145)

## 2022-07-01 LAB — LACTATE DEHYDROGENASE: LDH: 169 U/L (ref 98–192)

## 2022-07-01 LAB — PROTIME-INR
INR: 1.7 — ABNORMAL HIGH (ref 0.8–1.2)
Prothrombin Time: 20.1 seconds — ABNORMAL HIGH (ref 11.4–15.2)

## 2022-07-01 LAB — GLUCOSE, CAPILLARY
Glucose-Capillary: 131 mg/dL — ABNORMAL HIGH (ref 70–99)
Glucose-Capillary: 118 mg/dL — ABNORMAL HIGH (ref 70–99)
Glucose-Capillary: 199 mg/dL — ABNORMAL HIGH (ref 70–99)
Glucose-Capillary: 260 mg/dL — ABNORMAL HIGH (ref 70–99)

## 2022-07-01 LAB — HEPARIN LEVEL (UNFRACTIONATED): Heparin Unfractionated: <0.1 IU/mL — ABNORMAL LOW (ref 0.30–0.70)

## 2022-07-01 MED ORDER — WARFARIN SODIUM 7.5 MG PO TABS
7.5000 mg | ORAL_TABLET | Freq: Once | ORAL | Status: AC
  Administered 2022-07-01: 7.5 mg via ORAL
  Filled 2022-07-01: qty 1

## 2022-07-01 NOTE — Progress Notes (Signed)
ANTICOAGULATION CONSULT NOTE  Pharmacy Consult for heparin + warfarin Indication:  LVAD  No Known Allergies  Patient Measurements: Height: 5\' 10"  (177.8 cm) Weight: 85.2 kg (187 lb 13.3 oz) IBW/kg (Calculated) : 73 Heparin Dosing Weight: 80kg  Vital Signs: Temp: 97.7 F (36.5 C) (07/24 1126) Temp Source: Oral (07/24 1126) BP: 112/86 (07/24 1126) Pulse Rate: 83 (07/24 1131)  Labs: Recent Labs    06/29/22 0350 06/30/22 0604 07/01/22 0515  HGB 10.4* 10.6*  --   HCT 32.2* 32.7*  --   PLT 210 216  --   LABPROT 17.9* 18.5* 20.1*  INR 1.5* 1.6* 1.7*  HEPARINUNFRC <0.10* <0.10* <0.10*  CREATININE 1.49* 1.52* 1.46*     Estimated Creatinine Clearance: 54.9 mL/min (A) (by C-G formula based on SCr of 1.46 mg/dL (H)).   Medical History: Past Medical History:  Diagnosis Date   "    Arthritis    CAD (coronary artery disease)    a. s/p CABG in 11/2019 with LIMA-LAD, SVG-OM1, SVG-PDA and SVG-D1   CHF (congestive heart failure) (HCC)    a. EF < 20% by echo in 11/2019   Essential hypertension    PAF (paroxysmal atrial fibrillation) (HCC)    Type 2 diabetes mellitus (HCC)     Assessment: 43 yoM with HM3 LVAD admitted with ongoing LVAD driveline infection. Pt with planned debridements in OR. Pt is on warfarin PTA with last dose taken 7/3.   Pt s/p OR for infected wound debridement 7/7, 7/13,7/20 - next planned 7/31. Per discussion with surgeon keep INR 2 until debridement next week. Heparin level undetectable as expected with low-dosing heparin drip rate 500 units/hr and not titrating. CBC and LDH stable.  *Home warfarin dose 6mg  MWF, 4mg  AODs  Goal of Therapy:  INR goal 2-2.5, - keep closer to 2  Heparin level < 0.3 units/ml Monitor platelets by anticoagulation protocol: Yes   Plan:  Warfarin 7.5 mg tonight Continue heparin at 500 units/hr per surgery Can stop heparin when INR>1.8 Daily INR, heparin level, CBC, LDH   8/31 Pharm.D. CPP, BCPS Clinical  Pharmacist [phone] 07/01/2022 1:48 PM   Check [email or web address] for unit specific pharmacy number

## 2022-07-01 NOTE — Plan of Care (Signed)
  Problem: Education: Goal: Knowledge of General Education information will improve Description: Including pain rating scale, medication(s)/side effects and non-pharmacologic comfort measures Outcome: Progressing   Problem: Health Behavior/Discharge Planning: Goal: Ability to manage health-related needs will improve Outcome: Progressing   Problem: Clinical Measurements: Goal: Ability to maintain clinical measurements within normal limits will improve Outcome: Progressing Goal: Will remain free from infection Outcome: Progressing Goal: Diagnostic test results will improve Outcome: Progressing Goal: Respiratory complications will improve Outcome: Progressing Goal: Cardiovascular complication will be avoided Outcome: Progressing   Problem: Activity: Goal: Risk for activity intolerance will decrease Outcome: Progressing   Problem: Nutrition: Goal: Adequate nutrition will be maintained Outcome: Progressing   Problem: Coping: Goal: Level of anxiety will decrease Outcome: Progressing   Problem: Elimination: Goal: Will not experience complications related to bowel motility Outcome: Progressing Goal: Will not experience complications related to urinary retention Outcome: Progressing   Problem: Pain Managment: Goal: General experience of comfort will improve Outcome: Progressing   Problem: Safety: Goal: Ability to remain free from injury will improve Outcome: Progressing   Problem: Safety: Goal: Ability to remain free from injury will improve Outcome: Progressing   Problem: Skin Integrity: Goal: Risk for impaired skin integrity will decrease Outcome: Progressing   Problem: Education: Goal: Ability to describe self-care measures that may prevent or decrease complications (Diabetes Survival Skills Education) will improve Outcome: Progressing Goal: Individualized Educational Video(s) Outcome: Progressing   Problem: Coping: Goal: Ability to adjust to condition or  change in health will improve Outcome: Progressing   Problem: Fluid Volume: Goal: Ability to maintain a balanced intake and output will improve Outcome: Progressing   Problem: Health Behavior/Discharge Planning: Goal: Ability to identify and utilize available resources and services will improve Outcome: Progressing Goal: Ability to manage health-related needs will improve Outcome: Progressing   Problem: Metabolic: Goal: Ability to maintain appropriate glucose levels will improve Outcome: Progressing   Problem: Nutritional: Goal: Maintenance of adequate nutrition will improve Outcome: Progressing Goal: Progress toward achieving an optimal weight will improve Outcome: Progressing   Problem: Skin Integrity: Goal: Risk for impaired skin integrity will decrease Outcome: Progressing   Problem: Tissue Perfusion: Goal: Adequacy of tissue perfusion will improve Outcome: Progressing   Problem: Education: Goal: Ability to demonstrate management of disease process will improve Outcome: Progressing Goal: Ability to verbalize understanding of medication therapies will improve Outcome: Progressing Goal: Individualized Educational Video(s) Outcome: Progressing   Problem: Activity: Goal: Capacity to carry out activities will improve Outcome: Progressing   Problem: Cardiac: Goal: Ability to achieve and maintain adequate cardiopulmonary perfusion will improve Outcome: Progressing   Problem: Education: Goal: Patient will understand all VAD equipment and how it functions Outcome: Progressing Goal: Patient will be able to verbalize current INR target range and antiplatelet therapy for discharge home Outcome: Progressing   Problem: Cardiac: Goal: LVAD will function as expected and patient will experience no clinical alarms Outcome: Progressing

## 2022-07-01 NOTE — Plan of Care (Signed)
  Problem: Education: Goal: Knowledge of General Education information will improve Description Including pain rating scale, medication(s)/side effects and non-pharmacologic comfort measures Outcome: Progressing   Problem: Health Behavior/Discharge Planning: Goal: Ability to manage health-related needs will improve Outcome: Progressing   Problem: Clinical Measurements: Goal: Ability to maintain clinical measurements within normal limits will improve Outcome: Progressing   Problem: Pain Managment: Goal: General experience of comfort will improve Outcome: Progressing   Problem: Skin Integrity: Goal: Risk for impaired skin integrity will decrease Outcome: Progressing   

## 2022-07-01 NOTE — Progress Notes (Addendum)
Patient ID: Kevin Underwood, male   DOB: [birth year], 61 y.o.   MRN: [id number]   Advanced Heart Failure VAD Team Note  PCP-Cardiologist: Rozann Lesches, MD   Subjective:    S/p driveline site debridement and wound vac on 7/7.  7/10 Had Ramp ECHO. Speed optimized. 7/13 OR to [facility] dressing change.  Back on Heparin drip.  7/20 OR for debridement and VAC dressing change  Pt has been up and walking around. Remains on IV abx. Wound vac stable. Denies ab pain, SOB, and CP.    LVAD INTERROGATION:  HeartMate III LVAD:   Flow 4.4 liters/min, speed 5600, power 4.2  PI 2.9. VAD interrogated personally. Parameters stable.  Objective:    Vital Signs:   Temp:  [97.6 F (36.4 C)-98.7 F (37.1 C)] 98.2 F (36.8 C) (07/24 0515) Pulse Rate:  [76-87] 80 (07/24 0025) Resp:  [10-33] 10 (07/24 0515) BP: (108-125)/(85-99) 108/89 (07/24 0515) SpO2:  [95 %-98 %] 96 % (07/24 0025) Last BM Date : 06/29/22 Mean arterial Pressure  80-90s Intake/Output:   Intake/Output Summary (Last 24 hours) at 07/01/2022 0711 Last data filed at 07/01/2022 0500 Gross per 24 hour  Intake 1190 ml  Output 550 ml  Net 640 ml     Physical Exam   General:  well appearing. No respiratory difficulty HEENT: normal Neck: supple. No JVD. Carotids 2+ bilat; no bruits. No lymphadenopathy or thyromegaly appreciated. Cor: VAD hum Lungs: clear Abdomen: soft, nontender, nondistended. No hepatosplenomegaly. No bruits or masses. Active bowel sounds. Drive line site-> wound vac in place. Extremities: no cyanosis, clubbing, rash, edema. PICC RUE Neuro: alert & oriented x 3, cranial nerves grossly intact. moves all 4 extremities w/o difficulty. Affect pleasant.  Telemetry    NSR 80's (Personally reviewed)    Labs   Basic Metabolic Panel: Recent Labs  Lab 06/27/22 0450 06/28/22 0430 06/29/22 0350 06/30/22 0604 07/01/22 0515  NA 137 135 133* 139 137  K 4.2 4.6 4.3 4.4 4.3  CL 103 99 101 106 106  CO2 26 26 24 25 26    GLUCOSE 93 138* 133* 131* 150*  BUN 34* 39* 37* 32* 29*  CREATININE 1.28* 1.43* 1.49* 1.52* 1.46*  CALCIUM 8.8* 9.1 8.9 9.0 8.9    Liver Function Tests: No results for input(s): "AST", "ALT", "ALKPHOS", "BILITOT", "PROT", "ALBUMIN" in the last 168 hours.  No results for input(s): "LIPASE", "AMYLASE" in the last 168 hours. No results for input(s): "AMMONIA" in the last 168 hours.  CBC: Recent Labs  Lab 06/26/22 0400 06/27/22 0450 06/28/22 0430 06/29/22 0350 06/30/22 0604  WBC 11.2* 8.5 11.8* 8.8 9.3  HGB 10.7* 10.0* 10.0* 10.4* 10.6*  HCT 32.3* 31.6* 31.4* 32.2* 32.7*  MCV 86.1 87.5 87.2 87.3 87.4  PLT 220 205 233 210 216    INR: Recent Labs  Lab 06/27/22 0450 06/28/22 0430 06/29/22 0350 06/30/22 0604 07/01/22 0515  INR 1.7* 1.5* 1.5* 1.6* 1.7*    Other results:   Imaging   DG CHEST PORT 1 VIEW  Result Date: 06/30/2022 CLINICAL DATA:  Status post PICC line placement. EXAM: PORTABLE CHEST 1 VIEW COMPARISON:  06/13/2022 FINDINGS: The right arm PICC line tip terminates within the mid SVC, unchanged from previous exam. Previous median sternotomy and CABG procedure. LVAD device is again noted projecting over the left ventricular apex. Left atrial clip in place. No pleural effusion or edema. No airspace opacities. IMPRESSION: 1. No active cardiopulmonary abnormalities. 2. Right arm PICC line tip terminates within the mid SVC.  Electronically Signed   By: Signa Kell M.D.   On: 06/30/2022 07:47     Medications:     Scheduled Medications:  allopurinol  100 mg Oral Daily   Chlorhexidine Gluconate Cloth  6 each Topical Daily   docusate sodium  200 mg Oral BID   gabapentin  600 mg Oral BID   guaiFENesin  600 mg Oral BID   hydrALAZINE  12.5 mg Oral TID   insulin aspart  0-15 Units Subcutaneous TID WC   insulin aspart  0-5 Units Subcutaneous QHS   insulin detemir  15 Units Subcutaneous QHS   lactose free nutrition  237 mL Oral TID WC   loratadine  10 mg Oral Daily    methimazole  20 mg Oral BID   metoprolol succinate  25 mg Oral Daily   pantoprazole  40 mg Oral Daily   polyethylene glycol  17 g Oral Daily   rosuvastatin  20 mg Oral Daily   sertraline  50 mg Oral Daily   tamsulosin  0.4 mg Oral Daily   traZODone  150 mg Oral QHS   Warfarin - Pharmacist Dosing Inpatient   Does not apply q1600    Infusions:  heparin 500 Units/hr (06/30/22 0900)   vancomycin 1,000 mg (06/30/22 2012)    PRN Medications: acetaminophen, albuterol, morphine injection, ondansetron (ZOFRAN) IV, mouth rinse, sorbitol, traMADol   Patient Profile   61 y.o. male with history of HM III LVAD, chronic systolic CHF/ICM, COPD, PAF. 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   Readmitted for driveline infection and surgical debridement/possible wound vac placement.  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 S/p driveline site debridement 6/8. Surgical wound cultures grew MRSA, repeat debridement 6/23. Driveline debridement with wound vac placement on 7/7.  - Transitioned from daptomycin to vancomycin due to elevated CK.  Vancomycin planned x 6 wks, end date 07/18/22.  - 7/13, 7/20 VAC dressing change.  - Wound VAC working well - Back to OR 7/31 for debridement and Vac change - Continue heparin drip and warfarin. INR 1.7 today. Keep INR 1.4-1.7 range as he has to go back to OR 2.  Chronic systolic CHF: Echo 10/21 with EF 20-25%, mildly decreased RV function. LHC/RHC in 12/21 with patent grafts, low output. Suspect mixed ischemic/nonischemic cardiomyopathy (prior heavy ETOH and drugs as well as CAD).  No ETOH, drugs, smoking since CABG in 12/20. Admitted with cardiogenic shock in 12/21, had placement of Impella 5.5 initially, now s/p Heartmate 3 LVAD on 11/17/20.  Ramp echo 2/23 with speed decreased to 5500 rpm. He would eventually like heart transplant. NYHA class II. Have been allowing MAP to run higher  due to recent AKI thought to be due to lowering BP too much in setting of right renal artery stenosis.  - Had Ramp ECHO with speed optimized.  - INR 1.7 today on warfarin.+ heparin drip.  LDH stable. Discussed dosing with PharmD personally. - Volume status stable. - MAPs 90-109 today. Goal MAP 90-100 with RAS. Can increase hydral as needed - Continue Toprol XL 25 mg daily.  - Continue warfarin/low dose heparin.  - PICC placed for long-term antibiotic use 3. AKI: Creatinine up to 4.6 last admission, initially thought this was ATN from low BP but he was never markedly hypotensive and LFTs without any evidence of shock liver. ? Vanc toxicity (troughs were ok). Taken off Entresto and hydralazine. Volume repleted with IVF. In reviewing prior CTA  abdomen/pelvis, he has high-grade stenosis of proximal right renal artery. D/w Renal and feel like he may need higher perfusion pressures. Scr now stable 1.4-1.5 Let MAPs run 90-100. - Would consider reassessment of renal artery stenosis and possible treatment once creatinine has improved to baseline.  4.  CAD: S/p CABG 12/20.  LHC pre-VAD with patent grafts, no target for intervention. - Stopped statin with elevated CK => this was in setting of daptomycin.  - CKs normalized , statin restarted.  5.Atrial fibrillation: Paroxysmal.  S/p Maze and LA appendage clip with CABG in 12/20. In NSR today  - Continue Toprol XL 25 mg daily.  - Off amiodarone with hyperthyroidism. - On Warfarin/heparin.  6.  Type 2 diabetes: Takes glipizide at home.  - SSI  - Continue Levemir 15 units qHS - Diabetes coordinator following 7. 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  8. 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  He missed his endocrinology appt. Has been rescheduled for July 27th. - He is now off amiodarone.   - Continue methimazole 20 mg bid, TSH was mildly elevated.   9.  Gout: On allopurinol.  10. Anemia (chronic blood loss):   - Got 1u RBCs on 7/12 Hgb stable at 10.6 today    I reviewed the LVAD parameters from today, and compared the results to the patient's prior recorded data.  No programming changes were made.  The LVAD is functioning within specified parameters.  The patient performs LVAD self-test daily.  LVAD interrogation was negative for any significant power changes, alarms or PI events/speed drops.  LVAD equipment check completed and is in good working order.  Back-up equipment present.   LVAD education done on emergency procedures and precautions and reviewed exit site care.  Length of Stay: 46 Kevin Bleacher, NP  7:11 AM  VAD Team --- VAD ISSUES ONLY--- Pager [phone] (7am - 7am)  Advanced Heart Failure Team  Pager [phone] (M-F; 7a - 5p)  Please contact [facility] for night-coverage after hours (5p -7a ) and weekends on [email or web address]  Patient seen with NP, agree with the above note.  No complaints today.  No dyspnea.  Creatinine stable 1.46.   General: Well appearing this am. NAD.  HEENT: Normal. Neck: Supple, JVP 7-8 cm. Carotids OK.  Cardiac:  Mechanical heart sounds with LVAD hum present.  Lungs:  CTAB, normal effort.  Abdomen:  NT, ND, no HSM. No bruits or masses. +BS  LVAD exit site: Wound vac at driveline site.  Extremities:  Warm and dry. No cyanosis, clubbing, rash, or edema.  Neuro:  Alert & oriented x 3. Cranial nerves grossly intact. Moves all 4 extremities w/o difficulty. Affect pleasant    INR 1.7 today. Keep in this range on heparin gtt/warfarin.  He will return to OR on 7/31 for debridement and wound vac change.  In the mean time, he will stay in the hospital on vancomycin.   Kevin Underwood 07/01/2022. 12:21 PM

## 2022-07-01 NOTE — Progress Notes (Signed)
LVAD Coordinator Rounding Note:  Admitted 06/12/22 to Dr. Alford Highland service due to drive line debridement scheduled with Dr Donata Clay 06/14/22.   S/p driveline site debridement and wound vac on 7/7.  7/10 Had Ramp ECHO. Speed optimized. 7/13 OR to [facility] dressing change.  Back on Heparin drip.  7/20 OR for debridement and VAC dressing change  Pt sitting up on side of the bed. He has just returned from walking outside. He is enjoying his room with a window that looks outside over the heart and vascular center. Denies complaints.    Wound vac in place. No leaks noted. Will plan to return to OR 07/08/22 at 0730 for wound vac change.   Transitioned from daptomycin to vancomycin due to elevated CK. Vancomycin planned x 6 weeks; end date 07/18/22.   Vital signs: Temp:  97.7 HR: 82 Doppler Pressure: 125 Automatic BP: 110/96 (102) O2 Sat: 95% on RA Wt: 171.7>174.6>178.6>182.9>181>182.3>182.7>186.7>184.5>184.3>186.7>185.6>188.7>187.8 lbs   LVAD interrogation reveals:  Speed: 5600 Flow:  4.4 Power:  4.3w PI: 3.1 Alarms: on batteries Events: on batteries Hct: 32  Fixed speed:  5600 Low speed limit:  5300  Drive Line:  Wound vac change in OR today per Dr Donata Clay. Suction at 125; no leaks noted; draining thin brown cloudy fluid. Anchor intact to drive line. Planned wound vac change Monday, 07/08/22 per Dr. Donata Clay.   Labs:  LDH trend: 249>244>208>22>229>199>210>189>163>160>147>157>147>145>169  INR trend: 1.3 >1.3>1.2>1.1>1.1>1.1>1.1>1.2>1.3>1.5>1.6>1.7>1.7>1.5>1.7  Anticoagulation Plan: -INR Goal: 2.0 - 2.5 -ASA Dose: none  ICD: N/A  Drips:  Heparin - 500 units/hr  Infection:  06/07/22>> repeat drive line culture>> abundant MRSA; final 06/2022>> OR culture>>   Blood Products: - 06/21/22>> 2 PRBC   Plan/Recommendations:  Page VAD coordinators for equipment or drive line issues Plan for drive line debridement in OR per Dr Zenaida Niece Trigt Monday, 07/08/22 at 0730. VAD coordinator will  accompany pt.   Alyce Pagan RN VAD Coordinator  Office: [phone]  24/7 Pager: [phone]

## 2022-07-02 DIAGNOSIS — Z95811 Presence of heart assist device: Secondary | ICD-10-CM | POA: Diagnosis not present

## 2022-07-02 DIAGNOSIS — T827XXA Infection and inflammatory reaction due to other cardiac and vascular devices, implants and grafts, initial encounter: Secondary | ICD-10-CM | POA: Diagnosis not present

## 2022-07-02 LAB — BASIC METABOLIC PANEL
Anion gap: 9 (ref 5–15)
BUN: 28 mg/dL — ABNORMAL HIGH (ref 8–23)
CO2: 22 mmol/L (ref 22–32)
Calcium: 8.9 mg/dL (ref 8.9–10.3)
Chloride: 104 mmol/L (ref 98–111)
Creatinine, Ser: 1.36 mg/dL — ABNORMAL HIGH (ref 0.61–1.24)
GFR, Estimated: 59 mL/min — ABNORMAL LOW (ref >60)
Glucose, Bld: 116 mg/dL — ABNORMAL HIGH (ref 70–99)
Potassium: 4 mmol/L (ref 3.5–5.1)
Sodium: 135 mmol/L (ref 135–145)

## 2022-07-02 LAB — HEPARIN LEVEL (UNFRACTIONATED): Heparin Unfractionated: <0.1 IU/mL — ABNORMAL LOW (ref 0.30–0.70)

## 2022-07-02 LAB — PROTIME-INR
INR: 1.8 — ABNORMAL HIGH (ref 0.8–1.2)
Prothrombin Time: 20.3 seconds — ABNORMAL HIGH (ref 11.4–15.2)

## 2022-07-02 LAB — GLUCOSE, CAPILLARY
Glucose-Capillary: 215 mg/dL — ABNORMAL HIGH (ref 70–99)
Glucose-Capillary: 151 mg/dL — ABNORMAL HIGH (ref 70–99)
Glucose-Capillary: 175 mg/dL — ABNORMAL HIGH (ref 70–99)
Glucose-Capillary: 227 mg/dL — ABNORMAL HIGH (ref 70–99)

## 2022-07-02 LAB — VANCOMYCIN, PEAK: Vancomycin Pk: 26 ug/mL — ABNORMAL LOW (ref 30–40)

## 2022-07-02 LAB — LACTATE DEHYDROGENASE: LDH: 149 U/L (ref 98–192)

## 2022-07-02 LAB — VANCOMYCIN, TROUGH: Vancomycin Tr: 11 ug/mL — ABNORMAL LOW (ref 15–20)

## 2022-07-02 MED ORDER — WARFARIN SODIUM 3 MG PO TABS
6.0000 mg | ORAL_TABLET | Freq: Once | ORAL | Status: AC
  Administered 2022-07-02: 6 mg via ORAL
  Filled 2022-07-02: qty 2

## 2022-07-02 MED ORDER — ALTEPLASE 2 MG IJ SOLR
2.0000 mg | Freq: Once | INTRAMUSCULAR | Status: AC
Start: 2022-07-02 — End: 2022-07-02
  Administered 2022-07-02: 2 mg
  Filled 2022-07-02: qty 2

## 2022-07-02 NOTE — Progress Notes (Addendum)
Patient ID: MELBERN CEFALO, male   DOB: 08/07/1961, 61 y.o.   MRN: [id number]   Advanced Heart Failure VAD Team Note  PCP-Cardiologist: Rozann Lesches, MD   Subjective:    S/p driveline site debridement and wound vac on 7/7.  7/10 Had Ramp ECHO. Speed optimized. 7/13 OR to [facility] dressing change.  Back on Heparin drip.  7/20 OR for debridement and VAC dressing change  OOB sitting up in chair. Didn't sleep too well last night but no other complaints today. Denies DL pain currently. No CP or dyspnea.    LVAD INTERROGATION:  HeartMate III LVAD:   Flow 4.3 liters/min, speed 5600, power 4.2  PI 3.0. VAD interrogated personally. Parameters stable.  Objective:    Vital Signs:   Temp:  [97.7 F (36.5 C)-98.7 F (37.1 C)] 98.6 F (37 C) (07/25 0451) Pulse Rate:  [64-83] 66 (07/25 0451) Resp:  [18-20] 20 (07/25 0451) BP: (99-124)/(79-100) 99/82 (07/25 0451) SpO2:  [96 %-98 %] 98 % (07/25 0451) Weight:  [85.8 kg] 85.8 kg (07/25 0451) Last BM Date : 07/01/22 Mean arterial Pressure  90s Intake/Output:   Intake/Output Summary (Last 24 hours) at 07/02/2022 0756 Last data filed at 07/02/2022 [id number] Gross per 24 hour  Intake 1170 ml  Output 1550 ml  Net -380 ml     Physical Exam   General:  well appearing, sitting up in chair. No respiratory difficulty HEENT: normal Neck: supple. No JVD. Carotids 2+ bilat; no bruits. No lymphadenopathy or thyromegaly appreciated. Cor: VAD hum Lungs: CTAB Abdomen: soft, nontender, nondistended. No hepatosplenomegaly. No bruits or masses. Active bowel sounds. Drive line site-> wound vac in place. Extremities: no cyanosis, clubbing, rash, edema. PICC RUE Neuro: alert & oriented x 3, cranial nerves grossly intact. moves all 4 extremities w/o difficulty. Affect pleasant.  Telemetry    NSR 80's (Personally reviewed)    Labs   Basic Metabolic Panel: Recent Labs  Lab 06/28/22 0430 06/29/22 0350 06/30/22 0604 07/01/22 0515 07/02/22 0037  NA 135  133* 139 137 135  K 4.6 4.3 4.4 4.3 4.0  CL 99 101 106 106 104  CO2 26 24 25 26 22   GLUCOSE 138* 133* 131* 150* 116*  BUN 39* 37* 32* 29* 28*  CREATININE 1.43* 1.49* 1.52* 1.46* 1.36*  CALCIUM 9.1 8.9 9.0 8.9 8.9    Liver Function Tests: No results for input(s): "AST", "ALT", "ALKPHOS", "BILITOT", "PROT", "ALBUMIN" in the last 168 hours.  No results for input(s): "LIPASE", "AMYLASE" in the last 168 hours. No results for input(s): "AMMONIA" in the last 168 hours.  CBC: Recent Labs  Lab 06/26/22 0400 06/27/22 0450 06/28/22 0430 06/29/22 0350 06/30/22 0604  WBC 11.2* 8.5 11.8* 8.8 9.3  HGB 10.7* 10.0* 10.0* 10.4* 10.6*  HCT 32.3* 31.6* 31.4* 32.2* 32.7*  MCV 86.1 87.5 87.2 87.3 87.4  PLT 220 205 233 210 216    INR: Recent Labs  Lab 06/28/22 0430 06/29/22 0350 06/30/22 0604 07/01/22 0515 07/02/22 0037  INR 1.5* 1.5* 1.6* 1.7* 1.8*    Other results:   Imaging   No results found.   Medications:     Scheduled Medications:  allopurinol  100 mg Oral Daily   Chlorhexidine Gluconate Cloth  6 each Topical Daily   docusate sodium  200 mg Oral BID   gabapentin  600 mg Oral BID   guaiFENesin  600 mg Oral BID   hydrALAZINE  12.5 mg Oral TID   insulin aspart  0-15 Units Subcutaneous TID WC  insulin aspart  0-5 Units Subcutaneous QHS   insulin detemir  15 Units Subcutaneous QHS   lactose free nutrition  237 mL Oral TID WC   loratadine  10 mg Oral Daily   methimazole  20 mg Oral BID   metoprolol succinate  25 mg Oral Daily   pantoprazole  40 mg Oral Daily   polyethylene glycol  17 g Oral Daily   rosuvastatin  20 mg Oral Daily   sertraline  50 mg Oral Daily   tamsulosin  0.4 mg Oral Daily   traZODone  150 mg Oral QHS   Warfarin - Pharmacist Dosing Inpatient   Does not apply q1600    Infusions:  heparin 500 Units/hr (07/01/22 2000)   vancomycin 1,000 mg (07/01/22 2029)    PRN Medications: acetaminophen, albuterol, morphine injection, ondansetron (ZOFRAN)  IV, mouth rinse, sorbitol, traMADol   Patient Profile   61 y.o. male with history of HM III LVAD, chronic systolic CHF/ICM, COPD, PAF. 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   Readmitted for driveline infection and surgical debridement/possible wound vac placement.  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 S/p driveline site debridement 6/8. Surgical wound cultures grew MRSA, repeat debridement 6/23. Driveline debridement with wound vac placement on 7/7.  - Transitioned from daptomycin to vancomycin due to elevated CK.  Vancomycin planned x 6 wks, end date 07/18/22.  - 7/13, 7/20 VAC dressing change.  - Wound VAC working well - Back to OR 7/31 for debridement and Vac change - Continue heparin drip and warfarin. INR 1.8 today. Keep INR 1.4-1.7 range as he has to go back to OR 2.  Chronic systolic CHF: Echo 10/21 with EF 20-25%, mildly decreased RV function. LHC/RHC in 12/21 with patent grafts, low output. Suspect mixed ischemic/nonischemic cardiomyopathy (prior heavy ETOH and drugs as well as CAD).  No ETOH, drugs, smoking since CABG in 12/20. Admitted with cardiogenic shock in 12/21, had placement of Impella 5.5 initially, now s/p Heartmate 3 LVAD on 11/17/20.  Ramp echo 2/23 with speed decreased to 5500 rpm. He would eventually like heart transplant. NYHA class II. Have been allowing MAP to run higher due to recent AKI thought to be due to lowering BP too much in setting of right renal artery stenosis.  - Had Ramp ECHO with speed optimized.  - INR 1.8 today on warfarin.+ heparin drip.  LDH stable. Discussed dosing with PharmD personally. - Volume status stable. - MAPs 90s today. Goal MAP 90-100 with RAS. Can increase hydral as needed - Continue Toprol XL 25 mg daily.  - Continue warfarin/low dose heparin.  - PICC placed for long-term antibiotic use 3. AKI: Creatinine up to 4.6 last admission, initially thought  this was ATN from low BP but he was never markedly hypotensive and LFTs without any evidence of shock liver. ? Vanc toxicity (troughs were ok). Taken off Entresto and hydralazine. Volume repleted with IVF. In reviewing prior CTA abdomen/pelvis, he has high-grade stenosis of proximal right renal artery. D/w Renal and feel like he may need higher perfusion pressures. Scr now stable 1.4-1.5 Let MAPs run 90-100. - Would consider reassessment of renal artery stenosis and possible treatment once creatinine has improved to baseline.  4.  CAD: S/p CABG 12/20.  LHC pre-VAD with patent grafts, no target for intervention. - Stopped statin with elevated CK => this was in setting of daptomycin.  - CKs normalized , statin restarted.  5.Atrial fibrillation: Paroxysmal.  S/p Maze and LA appendage clip with CABG in 12/20. In NSR today  - Continue Toprol XL 25 mg daily.  - Off amiodarone with hyperthyroidism. - On Warfarin/heparin.  6.  Type 2 diabetes: Takes glipizide at home.  - SSI  - Continue Levemir 15 units qHS - Diabetes coordinator following 7. 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  8. 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  He missed his endocrinology appt. Has been rescheduled for July 27th. - He is now off amiodarone.   - Continue methimazole 20 mg bid, TSH was mildly elevated.   9.  Gout: On allopurinol.  10. Anemia (chronic blood loss):  - Got 1u RBCs on 7/12 Hgb stable at 10.6 today    I reviewed the LVAD parameters from today, and compared the results to the patient's prior recorded data.  No programming changes were made.  The LVAD is functioning within specified parameters.  The patient performs LVAD self-test daily.  LVAD interrogation was negative for any significant power changes, alarms or PI events/speed drops.  LVAD equipment check completed and is in good working order.  Back-up equipment present.   LVAD education done on emergency  procedures and precautions and reviewed exit site care.  Length of Stay: 20 Robbie Lis, PA-C  7:56 AM  VAD Team --- VAD ISSUES ONLY--- Pager [phone] (7am - 7am)  Advanced Heart Failure Team  Pager [phone] (M-F; 7a - 5p)  Please contact [facility] for night-coverage after hours (5p -7a ) and weekends on [email or web address]  Patient seen with PA, agree with the above note.   He continues on vancomycin, denies pain at driveline site.  Creatinine stable at 1.36.  INR 1.8, he is on low dose heparin gtt. LVAD parameters stable.   General: Well appearing this am. NAD.  HEENT: Normal. Neck: Supple, JVP 7-8 cm. Carotids OK.  Cardiac:  Mechanical heart sounds with LVAD hum present.  Lungs:  CTAB, normal effort.  Abdomen:  NT, ND, no HSM. No bruits or masses. +BS  LVAD exit site: Wound vac Extremities:  Warm and dry. No cyanosis, clubbing, rash, or edema.  Neuro:  Alert & oriented x 3. Cranial nerves grossly intact. Moves all 4 extremities w/o difficulty. Affect pleasant    INR 1.8 today. Keep in this range on heparin gtt/warfarin.  He will return to OR on 7/31 for debridement and wound vac change. In the mean time, he will stay in the hospital on vancomycin.   Marca Ancona 07/02/2022 8:20 AM

## 2022-07-02 NOTE — Progress Notes (Signed)
ANTICOAGULATION CONSULT NOTE  Pharmacy Consult for heparin + warfarin Indication:  LVAD  No Known Allergies  Patient Measurements: Height: 5\' 10"  (177.8 cm) Weight: 85.8 kg (189 lb 2.5 oz) IBW/kg (Calculated) : 73 Heparin Dosing Weight: 80kg  Vital Signs: Temp: 98 F (36.7 C) (07/25 0758) Temp Source: Oral (07/25 0758) BP: 102/90 (07/25 0758) Pulse Rate: 74 (07/25 0758)  Labs: Recent Labs    06/30/22 0604 07/01/22 0515 07/02/22 0037  HGB 10.6*  --   --   HCT 32.7*  --   --   PLT 216  --   --   LABPROT 18.5* 20.1* 20.3*  INR 1.6* 1.7* 1.8*  HEPARINUNFRC <0.10* <0.10* <0.10*  CREATININE 1.52* 1.46* 1.36*     Estimated Creatinine Clearance: 58.9 mL/min (A) (by C-G formula based on SCr of 1.36 mg/dL (H)).   Medical History: Past Medical History:  Diagnosis Date   "    Arthritis    CAD (coronary artery disease)    a. s/p CABG in 11/2019 with LIMA-LAD, SVG-OM1, SVG-PDA and SVG-D1   CHF (congestive heart failure) (HCC)    a. EF < 20% by echo in 11/2019   Essential hypertension    PAF (paroxysmal atrial fibrillation) (HCC)    Type 2 diabetes mellitus (HCC)     Assessment: 76 yoM with HM3 LVAD admitted with ongoing LVAD driveline infection. Pt with planned debridements in OR. Pt is on warfarin PTA with last dose taken 7/3.   Pt s/p OR for infected wound debridement 7/7, 7/13,7/20 - next planned 7/31. Per discussion with surgeon keep INR ~2 until debridement next week.  Heparin level undetectable as expected with low-dosing heparin drip rate 500 units/hr and not titrating. CBC and LDH stable.  *Home warfarin dose 6mg  MWF, 4mg  AODs  Goal of Therapy:  INR goal 2-2.5, - keep closer to 2  Heparin level < 0.3 units/ml Monitor platelets by anticoagulation protocol: Yes   Plan:  Warfarin 6 mg tonight Continue heparin at 500 units/hr another day, may stop if INR continues to trend up Daily INR, heparin level, CBC, LDH  8/31 PharmD., BCPS Clinical  Pharmacist 07/02/2022 10:20 AM

## 2022-07-02 NOTE — Progress Notes (Signed)
Received call from bedside RN reporting leak alarms from wound vac. I went to bedside and reinforced outer wound vac dressing with large clear vac dressing, and redressed area of dressing where drive line exits dressing. No further leak alarms noted.   Alyce Pagan RN VAD Coordinator  Office: [phone]  24/7 Pager: [phone]

## 2022-07-02 NOTE — Progress Notes (Signed)
Pharmacy Antibiotic Note  Kevin Underwood is a 61 y.o. male admitted on 06/12/2022 with LVAD Driveline infection s/p debridement  7/7 , 7/13 and 7/20.Noted patient was on Daptomycin with CK increased up to 1000, pt transitioned back to vancomycin.   SCr 1.36 - stable. Vanc peak 26 mcg/ml, Vanc trough 11 mcg/ml Calculated AUC 450 on 1000mg  IV q12h  Plan: Continue Vancomycin 1000 mg IV every 24 hours  Watch Scr closely  Would continue weekly Vancomycin levels   Height: 5\' 10"  (177.8 cm) Weight: 85.8 kg (189 lb 2.5 oz) IBW/kg (Calculated) : 73  Temp (24hrs), Avg:98.3 F (36.8 C), Min:97.8 F (36.6 C), Max:98.7 F (37.1 C)  Recent Labs  Lab 06/26/22 0400 06/27/22 0450 06/28/22 0430 06/29/22 0350 06/30/22 0604 07/01/22 0515 07/01/22 2354 07/02/22 0037 07/02/22 2109  WBC 11.2* 8.5 11.8* 8.8 9.3  --   --   --   --   CREATININE 1.51* 1.28* 1.43* 1.49* 1.52* 1.46*  --  1.36*  --   VANCOTROUGH  --   --   --   --   --   --   --   --  11*  VANCOPEAK  --   --   --   --   --   --  26*  --   --      Estimated Creatinine Clearance: 58.9 mL/min (A) (by C-G formula based on SCr of 1.36 mg/dL (H)).    No Known Allergies   07/04/22, PharmD, BCPS Please see amion for complete clinical pharmacist phone list 07/02/2022 10:47 PM

## 2022-07-02 NOTE — Progress Notes (Signed)
LVAD Coordinator Rounding Note:  Admitted 06/12/22 to Dr. Alford Highland service due to drive line debridement scheduled with Dr Donata Clay 06/14/22.   S/p driveline site debridement and wound vac on 7/7.  7/10 Had Ramp ECHO. Speed optimized. 7/13 OR to [facility] dressing change.  Back on Heparin drip.  7/20 OR for debridement and VAC dressing change  Pt sitting up in the recliner, preparing to go walk outside. Denies complaints other than chronic back pain.    Wound vac in place. No leaks noted since dressing reinforced yesterday afternoon. Will plan to return to OR 07/08/22 at 0730 for wound vac change.   Transitioned from daptomycin to vancomycin due to elevated CK. Vancomycin planned x 6 weeks; end date 07/18/22.   Vital signs: Temp:  98.0 HR: 83 Doppler Pressure:  Automatic BP: 102/90 (96) O2 Sat: 98% on RA Wt: 171.7>174.6>178.6>182.9>181>...184.3>186.7>185.6>188.7>187.8>189.1 lbs   LVAD interrogation reveals:  Speed: 5600 Flow:  4.2 Power:  4.3w PI: 3.7 Alarms: on batteries Events: on batteries Hct: 32  Fixed speed:  5600 Low speed limit:  5300  Drive Line:  Wound vac change in OR today per Dr Donata Clay. Suction at 125; no leaks noted; draining thin brown cloudy fluid. Anchor intact to drive line. Planned wound vac change Monday, 07/08/22 per Dr. Donata Clay.   Labs:  LDH trend: 249>244>208>22>229>199>210>189>163>160>147>157>147>145>169>149  INR trend: 1.3 >1.3>1.2>1.1>1.1>1.1>1.1>1.2>1.3>1.5>1.6>1.7>1.7>1.5>1.7>1.8  Anticoagulation Plan: -INR Goal: 2.0 - 2.5 -ASA Dose: none  ICD: N/A  Drips:  Heparin - 500 units/hr  Infection:  06/07/22>> repeat drive line culture>> abundant MRSA; final  Blood Products: - 06/21/22>> 2 PRBC   Plan/Recommendations:  Page VAD coordinators for equipment or drive line issues Plan for drive line debridement in OR per Dr Zenaida Niece Trigt Monday, 07/08/22 at 0730. VAD coordinator will accompany pt.   Alyce Pagan RN VAD Coordinator  Office:  [phone]  24/7 Pager: [phone]

## 2022-07-03 DIAGNOSIS — Z95811 Presence of heart assist device: Secondary | ICD-10-CM | POA: Diagnosis not present

## 2022-07-03 DIAGNOSIS — T827XXA Infection and inflammatory reaction due to other cardiac and vascular devices, implants and grafts, initial encounter: Secondary | ICD-10-CM | POA: Diagnosis not present

## 2022-07-03 LAB — GLUCOSE, CAPILLARY
Glucose-Capillary: 154 mg/dL — ABNORMAL HIGH (ref 70–99)
Glucose-Capillary: 277 mg/dL — ABNORMAL HIGH (ref 70–99)
Glucose-Capillary: 178 mg/dL — ABNORMAL HIGH (ref 70–99)
Glucose-Capillary: 205 mg/dL — ABNORMAL HIGH (ref 70–99)

## 2022-07-03 LAB — BASIC METABOLIC PANEL
Anion gap: 6 (ref 5–15)
BUN: 32 mg/dL — ABNORMAL HIGH (ref 8–23)
CO2: 27 mmol/L (ref 22–32)
Calcium: 9 mg/dL (ref 8.9–10.3)
Chloride: 104 mmol/L (ref 98–111)
Creatinine, Ser: 1.37 mg/dL — ABNORMAL HIGH (ref 0.61–1.24)
GFR, Estimated: 59 mL/min — ABNORMAL LOW (ref >60)
Glucose, Bld: 140 mg/dL — ABNORMAL HIGH (ref 70–99)
Potassium: 4.2 mmol/L (ref 3.5–5.1)
Sodium: 137 mmol/L (ref 135–145)

## 2022-07-03 LAB — PROTIME-INR
INR: 1.8 — ABNORMAL HIGH (ref 0.8–1.2)
Prothrombin Time: 20.3 seconds — ABNORMAL HIGH (ref 11.4–15.2)

## 2022-07-03 LAB — CBC
HCT: 30.5 % — ABNORMAL LOW (ref 39.0–52.0)
Hemoglobin: 9.8 g/dL — ABNORMAL LOW (ref 13.0–17.0)
MCH: 28.5 pg (ref 26.0–34.0)
MCHC: 32.1 g/dL (ref 30.0–36.0)
MCV: 88.7 fL (ref 80.0–100.0)
Platelets: 190 10*3/uL (ref 150–400)
RBC: 3.44 MIL/uL — ABNORMAL LOW (ref 4.22–5.81)
RDW: 18.7 % — ABNORMAL HIGH (ref 11.5–15.5)
WBC: 7.7 10*3/uL (ref 4.0–10.5)
nRBC: 0 % (ref 0.0–0.2)

## 2022-07-03 LAB — HEPARIN LEVEL (UNFRACTIONATED): Heparin Unfractionated: <0.1 IU/mL — ABNORMAL LOW (ref 0.30–0.70)

## 2022-07-03 LAB — LACTATE DEHYDROGENASE: LDH: 122 U/L (ref 98–192)

## 2022-07-03 MED ORDER — WARFARIN SODIUM 7.5 MG PO TABS
7.5000 mg | ORAL_TABLET | Freq: Once | ORAL | Status: AC
  Administered 2022-07-03: 7.5 mg via ORAL
  Filled 2022-07-03: qty 1

## 2022-07-03 MED ORDER — HYDRALAZINE HCL 25 MG PO TABS
25.0000 mg | ORAL_TABLET | Freq: Three times a day (TID) | ORAL | Status: DC
  Administered 2022-07-03 – 2022-07-18 (×47): 25 mg via ORAL
  Filled 2022-07-03 (×48): qty 1

## 2022-07-03 NOTE — Progress Notes (Signed)
Pharmacy Antibiotic Note  Kevin Underwood is a 61 y.o. male admitted on 06/12/2022 with LVAD Driveline infection s/p debridement  7/7 , 7/13 and 7/20.Noted patient was on Daptomycin with CK increased up to 1000, pt transitioned back to vancomycin.   SCr 1.36 - stable. Vanc peak 26 mcg/ml, Vanc trough 11 mcg/ml Calculated AUC 450 on 1000mg  IV q 24h  Plan: Continue Vancomycin 1000 mg IV every 24 hours  Watch Scr closely  Would continue weekly Vancomycin levels   Height: 5\' 10"  (177.8 cm) Weight: 85.8 kg (189 lb 2.5 oz) IBW/kg (Calculated) : 73  Temp (24hrs), Avg:98.1 F (36.7 C), Min:97.7 F (36.5 C), Max:98.4 F (36.9 C)  Recent Labs  Lab 06/27/22 0450 06/28/22 0430 06/29/22 0350 06/30/22 0604 07/01/22 0515 07/01/22 2354 07/02/22 0037 07/02/22 2109 07/03/22 0355  WBC 8.5 11.8* 8.8 9.3  --   --   --   --  7.7  CREATININE 1.28* 1.43* 1.49* 1.52* 1.46*  --  1.36*  --  1.37*  VANCOTROUGH  --   --   --   --   --   --   --  11*  --   VANCOPEAK  --   --   --   --   --  26*  --   --   --      Estimated Creatinine Clearance: 58.5 mL/min (A) (by C-G formula based on SCr of 1.37 mg/dL (H)).    No Known Allergies   2110, PharmD, BCPS, BCIDP Infectious Diseases Clinical Pharmacist Phone: [phone] 07/03/2022 7:42 AM

## 2022-07-03 NOTE — Progress Notes (Signed)
Patient ID: Kevin Underwood, male   DOB: [birth year], 61 y.o.   MRN: [id number]   Advanced Heart Failure VAD Team Note  PCP-Cardiologist: Rozann Lesches, MD   Subjective:    S/p driveline site debridement and wound vac on 7/7.  7/10 Had Ramp ECHO. Speed optimized. 7/13 OR to [facility] dressing change.  Back on Heparin drip.  7/20 OR for debridement and VAC dressing change  No complaints this morning.  No driveline pain.  Has been walking.  MAP 90s generally.   LVAD INTERROGATION:  HeartMate III LVAD:   Flow 4.3 liters/min, speed 5600, power 4.2  PI 3.4. VAD interrogated personally. Parameters stable.  Objective:    Vital Signs:   Temp:  [97.7 F (36.5 C)-98.4 F (36.9 C)] 98.1 F (36.7 C) (07/26 0346) Pulse Rate:  [71-80] 72 (07/26 0346) Resp:  [11-20] 12 (07/26 0346) BP: (103-123)/(71-94) 116/94 (07/26 0346) SpO2:  [95 %-98 %] 98 % (07/26 0346) Weight:  [85.8 kg] 85.8 kg (07/26 0346) Last BM Date : 07/02/22 Mean arterial Pressure  90s Intake/Output:   Intake/Output Summary (Last 24 hours) at 07/03/2022 0819 Last data filed at 07/03/2022 0346 Gross per 24 hour  Intake 549.09 ml  Output 800 ml  Net -250.91 ml     Physical Exam   General: Well appearing this am. NAD.  HEENT: Normal. Neck: Supple, JVP 7-8 cm. Carotids OK.  Cardiac:  Mechanical heart sounds with LVAD hum present.  Lungs:  CTAB, normal effort.  Abdomen:  NT, ND, no HSM. No bruits or masses. +BS  LVAD exit site: Wound vac present Extremities:  Warm and dry. No cyanosis, clubbing, rash, or edema.  Neuro:  Alert & oriented x 3. Cranial nerves grossly intact. Moves all 4 extremities w/o difficulty. Affect pleasant    Telemetry    NSR 80's (Personally reviewed)    Labs   Basic Metabolic Panel: Recent Labs  Lab 06/29/22 0350 06/30/22 0604 07/01/22 0515 07/02/22 0037 07/03/22 0355  NA 133* 139 137 135 137  K 4.3 4.4 4.3 4.0 4.2  CL 101 106 106 104 104  CO2 24 25 26 22 27   GLUCOSE 133* 131* 150*  116* 140*  BUN 37* 32* 29* 28* 32*  CREATININE 1.49* 1.52* 1.46* 1.36* 1.37*  CALCIUM 8.9 9.0 8.9 8.9 9.0    Liver Function Tests: No results for input(s): "AST", "ALT", "ALKPHOS", "BILITOT", "PROT", "ALBUMIN" in the last 168 hours.  No results for input(s): "LIPASE", "AMYLASE" in the last 168 hours. No results for input(s): "AMMONIA" in the last 168 hours.  CBC: Recent Labs  Lab 06/27/22 0450 06/28/22 0430 06/29/22 0350 06/30/22 0604 07/03/22 0355  WBC 8.5 11.8* 8.8 9.3 7.7  HGB 10.0* 10.0* 10.4* 10.6* 9.8*  HCT 31.6* 31.4* 32.2* 32.7* 30.5*  MCV 87.5 87.2 87.3 87.4 88.7  PLT 205 233 210 216 190    INR: Recent Labs  Lab 06/29/22 0350 06/30/22 0604 07/01/22 0515 07/02/22 0037 07/03/22 0355  INR 1.5* 1.6* 1.7* 1.8* 1.8*    Other results:   Imaging   No results found.   Medications:     Scheduled Medications:  allopurinol  100 mg Oral Daily   Chlorhexidine Gluconate Cloth  6 each Topical Daily   docusate sodium  200 mg Oral BID   gabapentin  600 mg Oral BID   guaiFENesin  600 mg Oral BID   hydrALAZINE  12.5 mg Oral TID   insulin aspart  0-15 Units Subcutaneous TID WC   insulin  aspart  0-5 Units Subcutaneous QHS   insulin detemir  15 Units Subcutaneous QHS   lactose free nutrition  237 mL Oral TID WC   loratadine  10 mg Oral Daily   methimazole  20 mg Oral BID   metoprolol succinate  25 mg Oral Daily   pantoprazole  40 mg Oral Daily   polyethylene glycol  17 g Oral Daily   rosuvastatin  20 mg Oral Daily   sertraline  50 mg Oral Daily   tamsulosin  0.4 mg Oral Daily   traZODone  150 mg Oral QHS   Warfarin - Pharmacist Dosing Inpatient   Does not apply q1600    Infusions:  vancomycin 1,000 mg (07/03/22 0059)    PRN Medications: acetaminophen, albuterol, morphine injection, ondansetron (ZOFRAN) IV, mouth rinse, sorbitol, traMADol   Patient Profile   61 y.o. male with history of HM III LVAD, chronic systolic CHF/ICM, COPD, PAF. Recent admit  for driveline infection June 2023 and was placed on IV daptomycin.   Readmitted for driveline infection and surgical debridement/possible wound vac placement.  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 S/p driveline site debridement 6/8. Surgical wound cultures grew MRSA, repeat debridement 6/23. Driveline debridement with wound vac placement on 7/7. 7/13, 7/20 VAC dressing change. Wound VAC working well.  - Transitioned from daptomycin to vancomycin due to elevated CK.  Vancomycin planned x 6 wks, end date 07/18/22.  - Back to OR 7/31 for debridement and Vac change - Keep INR 1.4-1.7 range as he has to go back to OR, 1.7 today so can hold warfarin.  2.  Chronic systolic CHF: Echo 10/21 with EF 20-25%, mildly decreased RV function. LHC/RHC in 12/21 with patent grafts, low output. Suspect mixed ischemic/nonischemic cardiomyopathy (prior heavy ETOH and drugs as well as CAD).  No ETOH, drugs, smoking since CABG in 12/20. Admitted with cardiogenic shock in 12/21, had placement of Impella 5.5 initially, now s/p Heartmate 3 LVAD on 11/17/20.  Ramp echo 2/23 with speed decreased to 5500 rpm. He would eventually like heart transplant. NYHA class II. Have been allowing MAP to run higher due to recent AKI thought to be due to lowering BP too much in setting of right renal artery stenosis. Had Ramp ECHO with speed optimized. Volume status stable. MAP 90s-100s.  - INR 1.8 today, can hold heparin.  - Continue Toprol XL 25 mg daily.  - Can increase hydralazine to 25 mg tid MAP consistently high now.  - PICC placed for long-term antibiotic use 3. AKI: Creatinine up to 4.6 last admission, initially thought this was ATN from low BP but he was never markedly hypotensive and LFTs without any evidence of shock liver. ? Vanc toxicity (troughs were ok). Taken off Entresto and hydralazine. Volume repleted with IVF. In reviewing prior CTA abdomen/pelvis, he has high-grade stenosis of  proximal right renal artery. D/w Renal and feel like he may need higher perfusion pressures. Scr now stable, 1.37 today. Let MAPs run 90-100. - Would consider reassessment of renal artery stenosis and possible treatment once creatinine has improved to baseline.  4.  CAD: S/p CABG 12/20.  LHC pre-VAD with patent grafts, no target for intervention. - Continue statin.  5.  Atrial fibrillation: Paroxysmal.  S/p Maze and LA appendage clip with CABG in 12/20. In NSR today  - Continue Toprol XL 25 mg daily.  - Off amiodarone with hyperthyroidism. 6.  Type 2 diabetes: Takes glipizide at home.  - SSI  -  Continue Levemir 15 units qHS - Diabetes coordinator following 7. 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  8. 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  He missed his endocrinology appt. Has been rescheduled for July 27th, will need to reschedule again. - He is now off amiodarone.   - Continue methimazole 20 mg bid, TSH was mildly elevated.   9.  Gout: On allopurinol.  10. Anemia (chronic blood loss): Got 1u RBCs on 7/12. Hgb 9.8 today    I reviewed the LVAD parameters from today, and compared the results to the patient's prior recorded data.  No programming changes were made.  The LVAD is functioning within specified parameters.  The patient performs LVAD self-test daily.  LVAD interrogation was negative for any significant power changes, alarms or PI events/speed drops.  LVAD equipment check completed and is in good working order.  Back-up equipment present.   LVAD education done on emergency procedures and precautions and reviewed exit site care.  Length of Stay: 13 Marca Ancona, MD  8:19 AM  VAD Team --- VAD ISSUES ONLY--- Pager [phone] (7am - 7am)  Advanced Heart Failure Team  Pager [phone] (M-F; 7a - 5p)  Please contact [facility] for night-coverage after hours (5p -7a ) and weekends on [email or web address]

## 2022-07-03 NOTE — Progress Notes (Signed)
ANTICOAGULATION CONSULT NOTE  Pharmacy Consult for heparin  > warfarin Indication:  LVAD  No Known Allergies  Patient Measurements: Height: 5\' 10"  (177.8 cm) Weight: 85.8 kg (189 lb 2.5 oz) IBW/kg (Calculated) : 73 Heparin Dosing Weight: 80kg  Vital Signs: Temp: 98.4 F (36.9 C) (07/26 1152) Temp Source: Oral (07/26 1152) BP: 126/89 (07/26 1152) Pulse Rate: 90 (07/26 1152)  Labs: Recent Labs    07/01/22 0515 07/02/22 0037 07/03/22 0355  HGB  --   --  9.8*  HCT  --   --  30.5*  PLT  --   --  190  LABPROT 20.1* 20.3* 20.3*  INR 1.7* 1.8* 1.8*  HEPARINUNFRC <0.10* <0.10* <0.10*  CREATININE 1.46* 1.36* 1.37*     Estimated Creatinine Clearance: 58.5 mL/min (A) (by C-G formula based on SCr of 1.37 mg/dL (H)).   Medical History: Past Medical History:  Diagnosis Date   "    Arthritis    CAD (coronary artery disease)    a. s/p CABG in 11/2019 with LIMA-LAD, SVG-OM1, SVG-PDA and SVG-D1   CHF (congestive heart failure) (HCC)    a. EF < 20% by echo in 11/2019   Essential hypertension    PAF (paroxysmal atrial fibrillation) (HCC)    Type 2 diabetes mellitus (HCC)     Assessment: 78 yoM with HM3 LVAD admitted with ongoing LVAD driveline infection. Pt with planned debridements in OR. Pt is on warfarin PTA with last dose taken 7/3.   Pt s/p OR for infected wound debridement 7/7, 7/13,7/20 - next planned 7/31. Per discussion with surgeon keep INR ~2 until debridement next week.  Heparin level undetectable as expected with low-dosing heparin drip rate 500 units/hr - not titrating. INR 1.8 boost warfarin  CBC and LDH stable.  *Home warfarin dose 6mg  MWF, 4mg  AODs  Goal of Therapy:  INR goal 2-2.5 - keep closer to 2  Heparin level < 0.3 units/ml Monitor platelets by anticoagulation protocol: Yes   Plan:  Warfarin 7.5 mg tonight Stop heparin drip  Daily INR, heparin level, CBC, LDH   8/31 Pharm.D. CPP, BCPS Clinical Pharmacist [phone] 07/03/2022 2:47  PM

## 2022-07-03 NOTE — Plan of Care (Signed)
  Problem: Education: Goal: Knowledge of General Education information will improve Description: Including pain rating scale, medication(s)/side effects and non-pharmacologic comfort measures Outcome: Progressing   Problem: Health Behavior/Discharge Planning: Goal: Ability to manage health-related needs will improve Outcome: Progressing   Problem: Clinical Measurements: Goal: Ability to maintain clinical measurements within normal limits will improve Outcome: Progressing Goal: Will remain free from infection Outcome: Progressing Goal: Diagnostic test results will improve Outcome: Progressing Goal: Respiratory complications will improve Outcome: Progressing Goal: Cardiovascular complication will be avoided Outcome: Progressing   Problem: Activity: Goal: Risk for activity intolerance will decrease Outcome: Progressing   Problem: Nutrition: Goal: Adequate nutrition will be maintained Outcome: Progressing   Problem: Coping: Goal: Level of anxiety will decrease Outcome: Progressing   Problem: Elimination: Goal: Will not experience complications related to bowel motility Outcome: Progressing Goal: Will not experience complications related to urinary retention Outcome: Progressing   Problem: Pain Managment: Goal: General experience of comfort will improve Outcome: Progressing   Problem: Safety: Goal: Ability to remain free from injury will improve Outcome: Progressing   Problem: Skin Integrity: Goal: Risk for impaired skin integrity will decrease Outcome: Progressing   Problem: Education: Goal: Ability to describe self-care measures that may prevent or decrease complications (Diabetes Survival Skills Education) will improve Outcome: Progressing Goal: Individualized Educational Video(s) Outcome: Progressing   Problem: Coping: Goal: Ability to adjust to condition or change in health will improve Outcome: Progressing   Problem: Fluid Volume: Goal: Ability to  maintain a balanced intake and output will improve Outcome: Progressing   Problem: Health Behavior/Discharge Planning: Goal: Ability to identify and utilize available resources and services will improve Outcome: Progressing Goal: Ability to manage health-related needs will improve Outcome: Progressing   Problem: Metabolic: Goal: Ability to maintain appropriate glucose levels will improve Outcome: Progressing   Problem: Nutritional: Goal: Maintenance of adequate nutrition will improve Outcome: Progressing Goal: Progress toward achieving an optimal weight will improve Outcome: Progressing   Problem: Skin Integrity: Goal: Risk for impaired skin integrity will decrease Outcome: Progressing   Problem: Tissue Perfusion: Goal: Adequacy of tissue perfusion will improve Outcome: Progressing   Problem: Education: Goal: Ability to demonstrate management of disease process will improve Outcome: Progressing Goal: Ability to verbalize understanding of medication therapies will improve Outcome: Progressing Goal: Individualized Educational Video(s) Outcome: Progressing   Problem: Activity: Goal: Capacity to carry out activities will improve Outcome: Progressing   Problem: Cardiac: Goal: Ability to achieve and maintain adequate cardiopulmonary perfusion will improve Outcome: Progressing   Problem: Education: Goal: Patient will understand all VAD equipment and how it functions Outcome: Progressing Goal: Patient will be able to verbalize current INR target range and antiplatelet therapy for discharge home Outcome: Progressing

## 2022-07-03 NOTE — Progress Notes (Signed)
LVAD Coordinator Rounding Note:  Admitted 06/12/22 to Dr. Alford Highland service due to drive line debridement scheduled with Dr Donata Clay 06/14/22.   S/p driveline site debridement and wound vac on 7/7.  7/10 Had Ramp ECHO. Speed optimized. 7/13 OR to [facility] dressing change.  Back on Heparin drip.  7/20 OR for debridement and VAC dressing change  Pt sitting up in the recliner, preparing to go walk outside. Denies complaints other than chronic back pain.    Wound vac in place. No leaks noted since dressing reinforced on Monday. Will plan to return to OR 07/08/22 at 0730 for wound vac change.   Transitioned from daptomycin to vancomycin due to elevated CK. Vancomycin planned x 6 weeks; end date 07/18/22.   I called and canceled pt's follow up with [facility] scheduled 07/04/22. Will need to reschedule at discharge.   Vital signs: Temp:  97.6 HR: 77 Doppler Pressure: 100 Automatic BP: 110/89 (97) O2 Sat: 97% on RA Wt: 171.7>174.6>178.6>182.9>181>...184.3>186.7>185.6>188.7>187.8>189.1>189.1 lbs   LVAD interrogation reveals:  Speed: 5600 Flow:  4.5 Power:  4.2 w PI: 3.1 Alarms: on batteries Events: on batteries Hct: 32  Fixed speed:  5600 Low speed limit:  5300  Drive Line:  Wound vac change in OR today per Dr Donata Clay. Suction at 125; no leaks noted; draining thin brown cloudy fluid. Anchor intact to drive line. Planned wound vac change Monday, 07/08/22 per Dr. Donata Clay.   Labs:  LDH trend: 249>244>208>22>229>199>210>189>163>160>147>157>147>145>169>149>122  INR trend: 1.3 >1.3>1.2>1.1>1.1>1.1>1.1>1.2>1.3>1.5>1.6>1.7>1.7>1.5>1.7>1.8  Anticoagulation Plan: -INR Goal: 2.0 - 2.5 -ASA Dose: none  ICD: N/A  Drips:  Heparin - 500 units/hr  Infection:  06/07/22>> repeat drive line culture>> abundant MRSA; final  Blood Products: - 06/21/22>> 2 PRBC   Plan/Recommendations:  Page VAD coordinators for equipment or drive line issues Plan for drive line debridement  in OR per Dr Zenaida Niece Trigt Monday, 07/08/22 at 0730. VAD coordinator will accompany pt.   Alyce Pagan RN VAD Coordinator  Office: [phone]  24/7 Pager: [phone]

## 2022-07-04 ENCOUNTER — Ambulatory Visit: Payer: 59 | Admitting: "Endocrinology

## 2022-07-04 DIAGNOSIS — Z95811 Presence of heart assist device: Secondary | ICD-10-CM | POA: Diagnosis not present

## 2022-07-04 DIAGNOSIS — T827XXA Infection and inflammatory reaction due to other cardiac and vascular devices, implants and grafts, initial encounter: Secondary | ICD-10-CM | POA: Diagnosis not present

## 2022-07-04 LAB — BASIC METABOLIC PANEL
Anion gap: 5 (ref 5–15)
BUN: 33 mg/dL — ABNORMAL HIGH (ref 8–23)
CO2: 27 mmol/L (ref 22–32)
Calcium: 8.9 mg/dL (ref 8.9–10.3)
Chloride: 105 mmol/L (ref 98–111)
Creatinine, Ser: 1.36 mg/dL — ABNORMAL HIGH (ref 0.61–1.24)
GFR, Estimated: 59 mL/min — ABNORMAL LOW (ref >60)
Glucose, Bld: 135 mg/dL — ABNORMAL HIGH (ref 70–99)
Potassium: 4.1 mmol/L (ref 3.5–5.1)
Sodium: 137 mmol/L (ref 135–145)

## 2022-07-04 LAB — GLUCOSE, CAPILLARY
Glucose-Capillary: 155 mg/dL — ABNORMAL HIGH (ref 70–99)
Glucose-Capillary: 208 mg/dL — ABNORMAL HIGH (ref 70–99)
Glucose-Capillary: 182 mg/dL — ABNORMAL HIGH (ref 70–99)
Glucose-Capillary: 277 mg/dL — ABNORMAL HIGH (ref 70–99)

## 2022-07-04 LAB — CBC
HCT: 29.1 % — ABNORMAL LOW (ref 39.0–52.0)
Hemoglobin: 9.5 g/dL — ABNORMAL LOW (ref 13.0–17.0)
MCH: 28.4 pg (ref 26.0–34.0)
MCHC: 32.6 g/dL (ref 30.0–36.0)
MCV: 87.1 fL (ref 80.0–100.0)
Platelets: 188 10*3/uL (ref 150–400)
RBC: 3.34 MIL/uL — ABNORMAL LOW (ref 4.22–5.81)
RDW: 18.5 % — ABNORMAL HIGH (ref 11.5–15.5)
WBC: 7.7 10*3/uL (ref 4.0–10.5)
nRBC: 0 % (ref 0.0–0.2)

## 2022-07-04 LAB — PROTIME-INR
INR: 1.9 — ABNORMAL HIGH (ref 0.8–1.2)
Prothrombin Time: 22 seconds — ABNORMAL HIGH (ref 11.4–15.2)

## 2022-07-04 LAB — ACID FAST CULTURE WITH REFLEXED SENSITIVITIES (MYCOBACTERIA): Acid Fast Culture: NEGATIVE

## 2022-07-04 LAB — LACTATE DEHYDROGENASE: LDH: 143 U/L (ref 98–192)

## 2022-07-04 MED ORDER — WARFARIN SODIUM 3 MG PO TABS
6.0000 mg | ORAL_TABLET | Freq: Once | ORAL | Status: AC
  Administered 2022-07-04: 6 mg via ORAL
  Filled 2022-07-04: qty 2

## 2022-07-04 NOTE — Progress Notes (Signed)
   VAC dressing not working overnight due to blockage.   I changed. VAC sponge.  Sorbac left  in place.   Reapplied VAC sponge. Seal restored.    Bita Cartwright NP- C 8:12 AM

## 2022-07-04 NOTE — Progress Notes (Signed)
Patient ID: Kevin Underwood, male   DOB: [birth year], 61 y.o.   MRN: [id number]   Advanced Heart Failure VAD Team Note  PCP-Cardiologist: Nona Dell, MD   Subjective:    S/p driveline site debridement and wound vac on 7/7.  7/10 Had Ramp ECHO. Speed optimized. 7/13 OR to [facility] dressing change.  Back on Heparin drip.  7/20 OR for debridement and VAC dressing change  MAP around 90, improved.  Creatinine stable 1.36.  Wound vac occluded this morning.   LVAD INTERROGATION:  HeartMate III LVAD:   Flow 4.32liters/min, speed 5600, power 4.7  PI 2.9. VAD interrogated personally. Parameters stable.  Objective:    Vital Signs:   Temp:  [97.6 F (36.4 C)-98.6 F (37 C)] 98.6 F (37 C) (07/27 0721) Pulse Rate:  [74-90] 74 (07/27 0301) Resp:  [14-20] 18 (07/27 0721) BP: (104-126)/(57-91) 107/84 (07/27 0721) SpO2:  [95 %-98 %] 96 % (07/27 0721) Weight:  [86.6 kg] 86.6 kg (07/27 0301) Last BM Date : 07/03/22 Mean arterial Pressure  90 Intake/Output:   Intake/Output Summary (Last 24 hours) at 07/04/2022 0748 Last data filed at 07/04/2022 0700 Gross per 24 hour  Intake 1280 ml  Output 825 ml  Net 455 ml     Physical Exam   General: Well appearing this am. NAD.  HEENT: Normal. Neck: Supple, JVP 7-8 cm. Carotids OK.  Cardiac:  Mechanical heart sounds with LVAD hum present.  Lungs:  CTAB, normal effort.  Abdomen:  NT, ND, no HSM. No bruits or masses. +BS  LVAD exit site: Wound vac. Extremities:  Warm and dry. No cyanosis, clubbing, rash, or edema.  Neuro:  Alert & oriented x 3. Cranial nerves grossly intact. Moves all 4 extremities w/o difficulty. Affect pleasant    Telemetry    NSR 80's (Personally reviewed)    Labs   Basic Metabolic Panel: Recent Labs  Lab 06/30/22 0604 07/01/22 0515 07/02/22 0037 07/03/22 0355 07/04/22 0315  NA 139 137 135 137 137  K 4.4 4.3 4.0 4.2 4.1  CL 106 106 104 104 105  CO2 25 26 22 27 27   GLUCOSE 131* 150* 116* 140* 135*  BUN 32* 29*  28* 32* 33*  CREATININE 1.52* 1.46* 1.36* 1.37* 1.36*  CALCIUM 9.0 8.9 8.9 9.0 8.9    Liver Function Tests: No results for input(s): "AST", "ALT", "ALKPHOS", "BILITOT", "PROT", "ALBUMIN" in the last 168 hours.  No results for input(s): "LIPASE", "AMYLASE" in the last 168 hours. No results for input(s): "AMMONIA" in the last 168 hours.  CBC: Recent Labs  Lab 06/28/22 0430 06/29/22 0350 06/30/22 0604 07/03/22 0355 07/04/22 0315  WBC 11.8* 8.8 9.3 7.7 7.7  HGB 10.0* 10.4* 10.6* 9.8* 9.5*  HCT 31.4* 32.2* 32.7* 30.5* 29.1*  MCV 87.2 87.3 87.4 88.7 87.1  PLT 233 210 216 190 188    INR: Recent Labs  Lab 06/30/22 0604 07/01/22 0515 07/02/22 0037 07/03/22 0355 07/04/22 0315  INR 1.6* 1.7* 1.8* 1.8* 1.9*    Other results:   Imaging   No results found.   Medications:     Scheduled Medications:  allopurinol  100 mg Oral Daily   Chlorhexidine Gluconate Cloth  6 each Topical Daily   docusate sodium  200 mg Oral BID   gabapentin  600 mg Oral BID   guaiFENesin  600 mg Oral BID   hydrALAZINE  25 mg Oral TID   insulin aspart  0-15 Units Subcutaneous TID WC   insulin aspart  0-5 Units  Subcutaneous QHS   insulin detemir  15 Units Subcutaneous QHS   lactose free nutrition  237 mL Oral TID WC   loratadine  10 mg Oral Daily   methimazole  20 mg Oral BID   metoprolol succinate  25 mg Oral Daily   pantoprazole  40 mg Oral Daily   polyethylene glycol  17 g Oral Daily   rosuvastatin  20 mg Oral Daily   sertraline  50 mg Oral Daily   tamsulosin  0.4 mg Oral Daily   traZODone  150 mg Oral QHS   Warfarin - Pharmacist Dosing Inpatient   Does not apply q1600    Infusions:  vancomycin 1,000 mg (07/03/22 2019)    PRN Medications: acetaminophen, albuterol, morphine injection, ondansetron (ZOFRAN) IV, mouth rinse, sorbitol, traMADol   Patient Profile   61 y.o. male with history of HM III LVAD, chronic systolic CHF/ICM, COPD, PAF. Recent admit for driveline infection June  2023 and was placed on IV daptomycin.   Readmitted for driveline infection and surgical debridement/possible wound vac placement.  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 S/p driveline site debridement 6/8. Surgical wound cultures grew MRSA, repeat debridement 6/23. Driveline debridement with wound vac placement on 7/7. 7/13, 7/20 VAC dressing change. Wound vac occluded today.  - Will change sponge in wound vac.  - Transitioned from daptomycin to vancomycin due to elevated CK.  Vancomycin planned x 6 wks, end date 07/18/22.  - Back to OR 7/31 for debridement and Vac change - Keep INR 1.4-1.7 range as he has to go back to OR, 1.9 today so can hold warfarin.  2.  Chronic systolic CHF: Echo 10/21 with EF 20-25%, mildly decreased RV function. LHC/RHC in 12/21 with patent grafts, low output. Suspect mixed ischemic/nonischemic cardiomyopathy (prior heavy ETOH and drugs as well as CAD).  No ETOH, drugs, smoking since CABG in 12/20. Admitted with cardiogenic shock in 12/21, had placement of Impella 5.5 initially, now s/p Heartmate 3 LVAD on 11/17/20.  Ramp echo 2/23 with speed decreased to 5500 rpm. He would eventually like heart transplant. NYHA class II. Have been allowing MAP to run higher due to recent AKI thought to be due to lowering BP too much in setting of right renal artery stenosis. Had Ramp ECHO with speed optimized. Volume status stable. MAP improved around 90. - INR 1.9 today, can hold heparin.  - Continue Toprol XL 25 mg daily.  - Continue hydralazine 25 mg tid.  - PICC placed for long-term antibiotic use 3. AKI: Creatinine up to 4.6 last admission, initially thought this was ATN from low BP but he was never markedly hypotensive and LFTs without any evidence of shock liver. ? Vanc toxicity (troughs were ok). Taken off Entresto and hydralazine. Volume repleted with IVF. In reviewing prior CTA abdomen/pelvis, he has high-grade stenosis of proximal right  renal artery. D/w Renal and feel like he may need higher perfusion pressures. Scr now stable, 1.36 today. MAP in 90s is ok for him.  - Would consider reassessment of renal artery stenosis and possible treatment once creatinine has improved to baseline.  4.  CAD: S/p CABG 12/20.  LHC pre-VAD with patent grafts, no target for intervention. - Continue statin.  5.  Atrial fibrillation: Paroxysmal.  S/p Maze and LA appendage clip with CABG in 12/20. In NSR today  - Continue Toprol XL 25 mg daily.  - Off amiodarone with hyperthyroidism. 6.  Type 2 diabetes: Takes glipizide at home.  -  SSI  - Continue Levemir 15 units qHS - Diabetes coordinator following 7. 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  8. 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  He missed his endocrinology appt. Has been rescheduled for July 27th, will need to reschedule again. - He is now off amiodarone.   - Continue methimazole 20 mg bid, TSH was mildly elevated.   9.  Gout: On allopurinol.  10. Anemia (chronic blood loss): Got 1u RBCs on 7/12. Hgb 9.5 today    I reviewed the LVAD parameters from today, and compared the results to the patient's prior recorded data.  No programming changes were made.  The LVAD is functioning within specified parameters.  The patient performs LVAD self-test daily.  LVAD interrogation was negative for any significant power changes, alarms or PI events/speed drops.  LVAD equipment check completed and is in good working order.  Back-up equipment present.   LVAD education done on emergency procedures and precautions and reviewed exit site care.  Length of Stay: [place], MD  7:48 AM  VAD Team --- VAD ISSUES ONLY--- Pager [phone] (7am - 7am)  Advanced Heart Failure Team  Pager [phone] (M-F; 7a - 5p)  Please contact [facility] for night-coverage after hours (5p -7a ) and weekends on [email or web address]

## 2022-07-04 NOTE — Progress Notes (Signed)
LVAD Coordinator Rounding Note:  Admitted 06/12/22 to Dr. Alford Highland service due to drive line debridement scheduled with Dr Donata Clay 06/14/22.   S/p driveline site debridement and wound vac on 7/7.  7/10 Had Ramp ECHO. Speed optimized. 7/13 OR to [facility] dressing change.  Back on Heparin drip.  7/20 OR for debridement and VAC dressing change  Pt lying in bed. Denies complaints other than chronic back pain.    Wound vac in place. Had occulusion alarms this morning. NP changed wound vac at beside. Seal secure and working well now. Will plan to return to OR 07/08/22 at 0730 for wound vac change.   Transitioned from daptomycin to vancomycin due to elevated CK. Vancomycin planned x 6 weeks; end date 07/18/22.   Will need to reschedule appt w/[facility] at discharge.   Vital signs: Temp:  98.6 HR: 77 Doppler Pressure: 98 Automatic BP: 118/98 (105) O2 Sat: 98% on RA Wt: 171.7>174.6>178.6>182.9>181>...184.3>186.7>185.6>188.7>187.8>189.1>189.1>190.9lbs   LVAD interrogation reveals:  Speed: 5600 Flow:  4.2 Power:  4.1 w PI: 3.2 Alarms: none Events: none Hct: 29  Fixed speed:  5600 Low speed limit:  5300  Drive Line:  Wound vac in place. Suction at 125; no leaks noted; draining thin brown cloudy fluid. Anchor intact to drive line. Planned wound vac change Monday, 07/08/22 per Dr. Donata Clay.   Labs:  LDH trend: 249>244>208>22>229>199>210>189>163>160>147>157>147>145>169>149>122>143  INR trend: 1.3 >1.3>1.2>1.1>1.1>1.1>1.1>1.2>1.3>1.5>1.6>1.7>1.7>1.5>1.7>1.8>1.9  Anticoagulation Plan: -INR Goal: 2.0 - 2.5 -ASA Dose: none  ICD: N/A  Drips:  Heparin - 500 units/hr-off  Infection:  06/07/22>> repeat drive line culture>> abundant MRSA; final  Blood Products: - 06/21/22>> 2 PRBC   Plan/Recommendations:  Page VAD coordinators for equipment or drive line issues Plan for drive line debridement in OR per Dr Zenaida Niece Trigt Monday, 07/08/22 at 0730. VAD coordinator will  accompany pt.   Carlton Adam RN VAD Coordinator  Office: [phone]  24/7 Pager: [phone]

## 2022-07-04 NOTE — Progress Notes (Signed)
ANTICOAGULATION CONSULT NOTE  Pharmacy Consult for heparin  > warfarin Indication:  LVAD  No Known Allergies  Patient Measurements: Height: 5\' 10"  (177.8 cm) Weight: 86.6 kg (190 lb 14.7 oz) IBW/kg (Calculated) : 73 Heparin Dosing Weight: 80kg  Vital Signs: Temp: 98.7 F (37.1 C) (07/27 1044) Temp Source: Oral (07/27 1044) BP: 119/95 (07/27 1129) Pulse Rate: 80 (07/27 1129)  Labs: Recent Labs    07/02/22 0037 07/03/22 0355 07/04/22 0315  HGB  --  9.8* 9.5*  HCT  --  30.5* 29.1*  PLT  --  190 188  LABPROT 20.3* 20.3* 22.0*  INR 1.8* 1.8* 1.9*  HEPARINUNFRC <0.10* <0.10*  --   CREATININE 1.36* 1.37* 1.36*     Estimated Creatinine Clearance: 58.9 mL/min (A) (by C-G formula based on SCr of 1.36 mg/dL (H)).   Medical History: Past Medical History:  Diagnosis Date   "    Arthritis    CAD (coronary artery disease)    a. s/p CABG in 11/2019 with LIMA-LAD, SVG-OM1, SVG-PDA and SVG-D1   CHF (congestive heart failure) (HCC)    a. EF < 20% by echo in 11/2019   Essential hypertension    PAF (paroxysmal atrial fibrillation) (HCC)    Type 2 diabetes mellitus (HCC)     Assessment: 38 yoM with HM3 LVAD admitted with ongoing LVAD driveline infection. Pt with planned debridements in OR. Pt is on warfarin PTA with last dose taken 7/3.   Pt s/p OR for infected wound debridement 7/7, 7/13,7/20 - next planned 7/31. Per discussion with surgeon keep INR ~2 until debridement next week.  Heparin turned off yesterday 7/26. INR 1.9 this morning, cbc stable with no bleeding noted. LDH stable <150.   *Home warfarin dose 6mg  MWF, 4mg  AODs  Goal of Therapy:  INR goal 2-2.5 - keep closer to 2  Heparin level < 0.3 units/ml Monitor platelets by anticoagulation protocol: Yes   Plan:  Warfarin 6 mg tonight Daily INR, heparin level, CBC, LDH  8/26 PharmD., BCPS Clinical Pharmacist 07/04/2022 11:37 AM

## 2022-07-04 NOTE — Progress Notes (Signed)
Patient's wound vac beeping saying negative pressure occluded. This RN reinforced the dressing, rebooted the wound vac machine, checked the tubing for kinks-none noted. Made LVAD coordinator Jill Side aware.

## 2022-07-05 DIAGNOSIS — Z95811 Presence of heart assist device: Secondary | ICD-10-CM | POA: Diagnosis not present

## 2022-07-05 DIAGNOSIS — T827XXA Infection and inflammatory reaction due to other cardiac and vascular devices, implants and grafts, initial encounter: Secondary | ICD-10-CM | POA: Diagnosis not present

## 2022-07-05 LAB — CBC
HCT: 31.6 % — ABNORMAL LOW (ref 39.0–52.0)
Hemoglobin: 10.1 g/dL — ABNORMAL LOW (ref 13.0–17.0)
MCH: 28.1 pg (ref 26.0–34.0)
MCHC: 32 g/dL (ref 30.0–36.0)
MCV: 88 fL (ref 80.0–100.0)
Platelets: 187 10*3/uL (ref 150–400)
RBC: 3.59 MIL/uL — ABNORMAL LOW (ref 4.22–5.81)
RDW: 18.5 % — ABNORMAL HIGH (ref 11.5–15.5)
WBC: 8.4 10*3/uL (ref 4.0–10.5)
nRBC: 0 % (ref 0.0–0.2)

## 2022-07-05 LAB — BASIC METABOLIC PANEL
Anion gap: 7 (ref 5–15)
BUN: 32 mg/dL — ABNORMAL HIGH (ref 8–23)
CO2: 26 mmol/L (ref 22–32)
Calcium: 9.1 mg/dL (ref 8.9–10.3)
Chloride: 103 mmol/L (ref 98–111)
Creatinine, Ser: 1.36 mg/dL — ABNORMAL HIGH (ref 0.61–1.24)
GFR, Estimated: 59 mL/min — ABNORMAL LOW (ref >60)
Glucose, Bld: 99 mg/dL (ref 70–99)
Potassium: 4 mmol/L (ref 3.5–5.1)
Sodium: 136 mmol/L (ref 135–145)

## 2022-07-05 LAB — GLUCOSE, CAPILLARY
Glucose-Capillary: 119 mg/dL — ABNORMAL HIGH (ref 70–99)
Glucose-Capillary: 303 mg/dL — ABNORMAL HIGH (ref 70–99)
Glucose-Capillary: 210 mg/dL — ABNORMAL HIGH (ref 70–99)
Glucose-Capillary: 128 mg/dL — ABNORMAL HIGH (ref 70–99)

## 2022-07-05 LAB — LACTATE DEHYDROGENASE: LDH: 143 U/L (ref 98–192)

## 2022-07-05 LAB — PROTIME-INR
INR: 1.9 — ABNORMAL HIGH (ref 0.8–1.2)
Prothrombin Time: 21.9 seconds — ABNORMAL HIGH (ref 11.4–15.2)

## 2022-07-05 MED ORDER — INSULIN ASPART 100 UNIT/ML IJ SOLN
3.0000 [IU] | Freq: Three times a day (TID) | INTRAMUSCULAR | Status: DC
  Administered 2022-07-05 – 2022-07-18 (×30): 3 [IU] via SUBCUTANEOUS

## 2022-07-05 MED ORDER — WARFARIN SODIUM 3 MG PO TABS: 6.0000 mg | ORAL_TABLET | Freq: Once | ORAL | Status: DC

## 2022-07-05 MED ORDER — WARFARIN SODIUM 4 MG PO TABS
4.0000 mg | ORAL_TABLET | Freq: Once | ORAL | Status: AC
  Administered 2022-07-05: 4 mg via ORAL
  Filled 2022-07-05: qty 1

## 2022-07-05 NOTE — Progress Notes (Signed)
Pharmacy Antibiotic Note  Kevin Underwood is a 61 y.o. male admitted on 06/12/2022 with LVAD Driveline infection s/p debridement  7/7 , 7/13 and 7/20.Noted patient was on Daptomycin with CK increased up to 1000, pt transitioned back to vancomycin. Scr stable at 1.36 and last levels therapeutic on 7/24-7/25. Next planned trip to the OR on 7/31.   Plan: Continue Vancomycin 1000 mg IV every 24 hours  Watch Scr closely  Would continue weekly Vancomycin levels Next 7/31-8/1 Planning Vancomycin therapy through 07/18/22   Height: 5\' 10"  (177.8 cm) Weight: 87.1 kg (192 lb 0.3 oz) (scale B) IBW/kg (Calculated) : 73  Temp (24hrs), Avg:98.1 F (36.7 C), Min:97.6 F (36.4 C), Max:98.7 F (37.1 C)  Recent Labs  Lab 06/29/22 0350 06/30/22 0604 07/01/22 0515 07/01/22 2354 07/02/22 0037 07/02/22 2109 07/03/22 0355 07/04/22 0315 07/05/22 0456  WBC 8.8 9.3  --   --   --   --  7.7 7.7 8.4  CREATININE 1.49* 1.52* 1.46*  --  1.36*  --  1.37* 1.36* 1.36*  VANCOTROUGH  --   --   --   --   --  11*  --   --   --   VANCOPEAK  --   --   --  26*  --   --   --   --   --      Estimated Creatinine Clearance: 58.9 mL/min (A) (by C-G formula based on SCr of 1.36 mg/dL (H)).    No Known Allergies   07/07/22, PharmD, BCPS, BCIDP Infectious Diseases Clinical Pharmacist Phone: [phone] 07/05/2022 8:00 AM

## 2022-07-05 NOTE — Plan of Care (Signed)
  Problem: Education: Goal: Knowledge of General Education information will improve Description: Including pain rating scale, medication(s)/side effects and non-pharmacologic comfort measures Outcome: Progressing   Problem: Health Behavior/Discharge Planning: Goal: Ability to manage health-related needs will improve Outcome: Progressing   Problem: Clinical Measurements: Goal: Ability to maintain clinical measurements within normal limits will improve Outcome: Progressing Goal: Will remain free from infection Outcome: Progressing Goal: Diagnostic test results will improve Outcome: Progressing Goal: Respiratory complications will improve Outcome: Progressing Goal: Cardiovascular complication will be avoided Outcome: Progressing   Problem: Activity: Goal: Risk for activity intolerance will decrease Outcome: Progressing   Problem: Nutrition: Goal: Adequate nutrition will be maintained Outcome: Progressing   Problem: Coping: Goal: Level of anxiety will decrease Outcome: Progressing   Problem: Elimination: Goal: Will not experience complications related to bowel motility Outcome: Progressing Goal: Will not experience complications related to urinary retention Outcome: Progressing   Problem: Pain Managment: Goal: General experience of comfort will improve Outcome: Progressing   Problem: Safety: Goal: Ability to remain free from injury will improve Outcome: Progressing   Problem: Skin Integrity: Goal: Risk for impaired skin integrity will decrease Outcome: Progressing   Problem: Education: Goal: Ability to describe self-care measures that may prevent or decrease complications (Diabetes Survival Skills Education) will improve Outcome: Progressing Goal: Individualized Educational Video(s) Outcome: Progressing   Problem: Coping: Goal: Ability to adjust to condition or change in health will improve Outcome: Progressing   Problem: Fluid Volume: Goal: Ability to  maintain a balanced intake and output will improve Outcome: Progressing   Problem: Health Behavior/Discharge Planning: Goal: Ability to identify and utilize available resources and services will improve Outcome: Progressing Goal: Ability to manage health-related needs will improve Outcome: Progressing   Problem: Metabolic: Goal: Ability to maintain appropriate glucose levels will improve Outcome: Progressing   Problem: Nutritional: Goal: Maintenance of adequate nutrition will improve Outcome: Progressing Goal: Progress toward achieving an optimal weight will improve Outcome: Progressing   Problem: Skin Integrity: Goal: Risk for impaired skin integrity will decrease Outcome: Progressing   Problem: Tissue Perfusion: Goal: Adequacy of tissue perfusion will improve Outcome: Progressing   Problem: Education: Goal: Ability to demonstrate management of disease process will improve Outcome: Progressing Goal: Ability to verbalize understanding of medication therapies will improve Outcome: Progressing Goal: Individualized Educational Video(s) Outcome: Progressing   Problem: Activity: Goal: Capacity to carry out activities will improve Outcome: Progressing   Problem: Cardiac: Goal: Ability to achieve and maintain adequate cardiopulmonary perfusion will improve Outcome: Progressing   Problem: Education: Goal: Patient will understand all VAD equipment and how it functions Outcome: Progressing Goal: Patient will be able to verbalize current INR target range and antiplatelet therapy for discharge home Outcome: Progressing   Problem: Cardiac: Goal: LVAD will function as expected and patient will experience no clinical alarms Outcome: Progressing   

## 2022-07-05 NOTE — Progress Notes (Addendum)
Patient ID: Kevin Underwood, male   DOB: 1961-02-26, 61 y.o.   MRN: [id number]   Advanced Heart Failure VAD Team Note  PCP-Cardiologist: Rozann Lesches, MD   Subjective:    S/p driveline site debridement and wound vac on 7/7.  7/10 Had Ramp ECHO. Speed optimized. 7/13 OR to [facility] dressing change.  Back on Heparin drip.  7/20 OR for debridement and VAC dressing change  MAP around 80-90, improved.  Creatinine stable 1.36.     Fells good today, encouraged to ambulate. Denies CP/SOB.  LVAD INTERROGATION:  HeartMate III LVAD:   Flow 3.8 liters/min, speed 5600, power 4.3  PI 6.1. VAD interrogated personally. Parameters stable.  Objective:    Vital Signs:   Temp:  [97.6 F (36.4 C)-98.7 F (37.1 C)] 97.6 F (36.4 C) (07/28 0450) Pulse Rate:  [75-80] 80 (07/28 0450) Resp:  [15-20] 20 (07/28 0450) BP: (91-137)/(68-103) 91/68 (07/28 0450) SpO2:  [96 %-100 %] 98 % (07/28 0450) Weight:  [87.1 kg] 87.1 kg (07/28 0454) Last BM Date : 07/03/22 Mean arterial Pressure  90 Intake/Output:   Intake/Output Summary (Last 24 hours) at 07/05/2022 0716 Last data filed at 07/05/2022 0455 Gross per 24 hour  Intake 1320 ml  Output 350 ml  Net 970 ml     Physical Exam   General:  well appearing. No respiratory difficulty HEENT: normal Neck: supple. JVD ~8 cm. Carotids 2+ bilat; no bruits. No lymphadenopathy or thyromegaly appreciated. Cor: VAD hum present Lungs: clear Abdomen: soft, nontender, nondistended. No hepatosplenomegaly. No bruits or masses. Active bowel sounds. Extremities: no cyanosis, clubbing, rash, edema  Neuro: alert & oriented x 3, cranial nerves grossly intact. moves all 4 extremities w/o difficulty. Affect pleasant.  Telemetry    NSR 80's (Personally reviewed)    Labs   Basic Metabolic Panel: Recent Labs  Lab 07/01/22 0515 07/02/22 0037 07/03/22 0355 07/04/22 0315 07/05/22 0456  NA 137 135 137 137 136  K 4.3 4.0 4.2 4.1 4.0  CL 106 104 104 105 103  CO2 26 22  27 27 26   GLUCOSE 150* 116* 140* 135* 99  BUN 29* 28* 32* 33* 32*  CREATININE 1.46* 1.36* 1.37* 1.36* 1.36*  CALCIUM 8.9 8.9 9.0 8.9 9.1    Liver Function Tests: No results for input(s): "AST", "ALT", "ALKPHOS", "BILITOT", "PROT", "ALBUMIN" in the last 168 hours.  No results for input(s): "LIPASE", "AMYLASE" in the last 168 hours. No results for input(s): "AMMONIA" in the last 168 hours.  CBC: Recent Labs  Lab 06/29/22 0350 06/30/22 0604 07/03/22 0355 07/04/22 0315 07/05/22 0456  WBC 8.8 9.3 7.7 7.7 8.4  HGB 10.4* 10.6* 9.8* 9.5* 10.1*  HCT 32.2* 32.7* 30.5* 29.1* 31.6*  MCV 87.3 87.4 88.7 87.1 88.0  PLT 210 216 190 188 187    INR: Recent Labs  Lab 07/01/22 0515 07/02/22 0037 07/03/22 0355 07/04/22 0315 07/05/22 0456  INR 1.7* 1.8* 1.8* 1.9* 1.9*    Other results:   Imaging   No results found.   Medications:     Scheduled Medications:  allopurinol  100 mg Oral Daily   Chlorhexidine Gluconate Cloth  6 each Topical Daily   docusate sodium  200 mg Oral BID   gabapentin  600 mg Oral BID   guaiFENesin  600 mg Oral BID   hydrALAZINE  25 mg Oral TID   insulin aspart  0-15 Units Subcutaneous TID WC   insulin aspart  0-5 Units Subcutaneous QHS   insulin detemir  15 Units  Subcutaneous QHS   lactose free nutrition  237 mL Oral TID WC   loratadine  10 mg Oral Daily   methimazole  20 mg Oral BID   metoprolol succinate  25 mg Oral Daily   pantoprazole  40 mg Oral Daily   polyethylene glycol  17 g Oral Daily   rosuvastatin  20 mg Oral Daily   sertraline  50 mg Oral Daily   tamsulosin  0.4 mg Oral Daily   traZODone  150 mg Oral QHS   Warfarin - Pharmacist Dosing Inpatient   Does not apply q1600    Infusions:  vancomycin Stopped (07/04/22 2114)    PRN Medications: acetaminophen, albuterol, morphine injection, ondansetron (ZOFRAN) IV, mouth rinse, sorbitol, traMADol   Patient Profile   61 y.o. male with history of HM III LVAD, chronic systolic CHF/ICM,  COPD, PAF. 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   Readmitted for driveline infection and surgical debridement/possible wound vac placement.  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 S/p driveline site debridement 6/8. Surgical wound cultures grew MRSA, repeat debridement 6/23. Driveline debridement with wound vac placement on 7/7. 7/13, 7/20 VAC dressing change. Wound vac with milky/cloudy drainage today. - Transitioned from daptomycin to vancomycin due to elevated CK.  Vancomycin planned x 6 wks, end date 07/18/22.  - Back to OR 7/31 for debridement and Vac change - Keep INR 1.4-1.7 range as he has to go back to OR, 1.9 today so can hold warfarin.  2.  Chronic systolic CHF: Echo 10/21 with EF 20-25%, mildly decreased RV function. LHC/RHC in 12/21 with patent grafts, low output. Suspect mixed ischemic/nonischemic cardiomyopathy (prior heavy ETOH and drugs as well as CAD).  No ETOH, drugs, smoking since CABG in 12/20. Admitted with cardiogenic shock in 12/21, had placement of Impella 5.5 initially, now s/p Heartmate 3 LVAD on 11/17/20.  Ramp echo 2/23 with speed decreased to 5500 rpm. He would eventually like heart transplant. NYHA class II. Have been allowing MAP to run higher due to recent AKI thought to be due to lowering BP too much in setting of right renal artery stenosis. Had Ramp ECHO with speed optimized. Volume status stable. MAP improved around 90. - INR 1.9 today, can hold heparin.  - Continue Toprol XL 25 mg daily.  - Continue hydralazine 25 mg tid.  - PICC placed for long-term antibiotic use 3. AKI: Creatinine up to 4.6 last admission, initially thought this was ATN from low BP but he was never markedly hypotensive and LFTs without any evidence of shock liver. ? Vanc toxicity (troughs were ok). Taken off Entresto and hydralazine. Volume repleted with IVF. In reviewing prior CTA abdomen/pelvis, he has  high-grade stenosis of proximal right renal artery. D/w Renal and feel like he may need higher perfusion pressures. Scr now stable, 1.36 today. MAP in 90s is ok for him.  - Would consider reassessment of renal artery stenosis and possible treatment once creatinine has improved to baseline.  4.  CAD: S/p CABG 12/20.  LHC pre-VAD with patent grafts, no target for intervention. - Continue statin.  5.  Atrial fibrillation: Paroxysmal.  S/p Maze and LA appendage clip with CABG in 12/20. In NSR today  - Continue Toprol XL 25 mg daily.  - Off amiodarone with hyperthyroidism. 6.  Type 2 diabetes: Takes glipizide at home.  - SSI  - Continue Levemir 15 units qHS - Diabetes coordinator following 7. Methamphetamine abuse: Says he  has quit since prior to last hospitalization.  8. 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  He missed his endocrinology appt. Has been rescheduled for July 27th, will need to reschedule again. - He is now off amiodarone.   - Continue methimazole 20 mg bid, TSH was mildly elevated.   9.  Gout: On allopurinol.  10. Anemia (chronic blood loss): Got 1u RBCs on 7/12. Hgb 10.1 today    I reviewed the LVAD parameters from today, and compared the results to the patient's prior recorded data.  No programming changes were made.  The LVAD is functioning within specified parameters.  The patient performs LVAD self-test daily.  LVAD interrogation was negative for any significant power changes, alarms or PI events/speed drops.  LVAD equipment check completed and is in good working order.  Back-up equipment present.   LVAD education done on emergency procedures and precautions and reviewed exit site care.  Length of Stay: 23 Alen Bleacher, AGACNP-BC  7:16 AM  VAD Team --- VAD ISSUES ONLY--- Pager [phone] (7am - 7am)  Advanced Heart Failure Team  Pager [phone] (M-F; 7a - 5p)  Please contact [facility] for night-coverage after hours (5p -7a ) and weekends on  [email or web address]  Patient seen with NP, agree with the above note.   No further wound vac alarms after changing sponge yesterday.  He continues on vancomycin for driveline infection.  Hgb stable 10.1.  INR 1.9.  MAP generally 90s.   General: Well appearing this am. NAD.  HEENT: Normal. Neck: Supple, JVP 7-8 cm. Carotids OK.  Cardiac:  Mechanical heart sounds with LVAD hum present.  Lungs:  CTAB, normal effort.  Abdomen:  NT, ND, no HSM. No bruits or masses. +BS  LVAD exit site: Well-healed and incorporated. Dressing dry and intact. No erythema or drainage. Stabilization device present and accurately applied. Driveline dressing changed daily per sterile technique. Extremities:  Warm and dry. No cyanosis, clubbing, rash, or edema.  Neuro:  Alert & oriented x 3. Cranial nerves grossly intact. Moves all 4 extremities w/o difficulty. Affect pleasant    Continue vancomycin for driveline infection.    INR 1.9, hold heparin. Aim for 1.4-1.7 range for OR.  Plan for OR for debridement/wound vac replacement on 7/31.   MAP acceptable, creatinine stable.   Marca Ancona. 07/05/2022 7:55 AM

## 2022-07-05 NOTE — Progress Notes (Signed)
Inpatient Diabetes Program Recommendations  AACE/ADA: New Consensus Statement on Inpatient Glycemic Control (2015)  Target Ranges:  Prepandial:   less than 140 mg/dL      Peak postprandial:   less than 180 mg/dL (1-2 hours)      Critically ill patients:  140 - 180 mg/dL   Lab Results  Component Value Date   GLUCAP 303 (H) 07/05/2022   HGBA1C 8.2 (H) 05/13/2022    Review of Glycemic Control  Latest Reference Range & Units 07/04/22 16:01 07/04/22 21:16 07/05/22 06:21 07/05/22 11:58  Glucose-Capillary 70 - 99 mg/dL 950 (H) 932 (H) 671 (H) 303 (H)    Current orders for Inpatient glycemic control:  Levemir 15 units q HS Novolog moderate tid with meals and HS  Inpatient Diabetes Program Recommendations:    May consider adding Novolog meal coverage 3 units tid with meals (hold if patient eats less than 50% or NPO).   Thanks,  Beryl Meager, RN, BC-ADM Inpatient Diabetes Coordinator Pager [phone]  (8a-5p)

## 2022-07-05 NOTE — Progress Notes (Signed)
LVAD Coordinator Rounding Note:  Admitted 06/12/22 to Dr. Alford Highland service due to drive line debridement scheduled with Dr Donata Clay 06/14/22.   S/p driveline site debridement and wound vac on 7/7.  7/10 Had Ramp ECHO. Speed optimized. 7/13 OR to [facility] dressing change.  Back on Heparin drip.  7/20 OR for debridement and VAC dressing change  Pt sitting up in the chair. Denies complaints other than chronic back pain.    Wound vac in place.  Will plan to return to OR 07/08/22 at 0730 for wound vac change.   Transitioned from daptomycin to vancomycin due to elevated CK. Vancomycin planned x 6 weeks; end date 07/18/22.   Will need to reschedule appt w/[facility] at discharge.   Vital signs: Temp:  97.8 HR: 88 Doppler Pressure: 94 Automatic BP: 105/87 O2 Sat: 97% on RA Wt: 171.7>174.6>178.6>182.9>181>...184.3>186.7>185.6>188.7>187.8>189.1>189.1>190.9>192lbs   LVAD interrogation reveals:  Speed: 5600 Flow:  4.2 Power:  4.2 w PI: 3.8 Alarms: on batteries Events: on batteries Hct: 32  Fixed speed:  5600 Low speed limit:  5300  Drive Line:  Wound vac in place. Suction at 125; no leaks noted; draining thin brown cloudy fluid. Anchor intact to drive line. Planned wound vac change Monday, 07/08/22 per Dr. Donata Clay.   Labs:  LDH trend: 249>244>208>22>229>199>210>189>163>160>147>157>147>145>169>149>122>143  INR trend: 1.3 >1.3>1.2>1.1>1.1>1.1>1.1>1.2>1.3>1.5>1.6>1.7>1.7>1.5>1.7>1.8>1.9  Anticoagulation Plan: -INR Goal: 2.0 - 2.5 -ASA Dose: none  ICD: N/A  Drips:  Heparin - 500 units/hr-off  Infection:  06/07/22>> repeat drive line culture>> abundant MRSA; final  Blood Products: - 06/21/22>> 2 PRBC   Plan/Recommendations:  Page VAD coordinators for equipment or drive line issues Plan for drive line debridement in OR per Dr Zenaida Niece Trigt Monday, 07/08/22 at 0730. VAD coordinator will accompany pt.   Carlton Adam RN VAD Coordinator  Office: [phone]  24/7  Pager: [phone]

## 2022-07-05 NOTE — Progress Notes (Addendum)
ANTICOAGULATION CONSULT NOTE  Pharmacy Consult for warfarin Indication:  LVAD  No Known Allergies  Patient Measurements: Height: 5\' 10"  (177.8 cm) Weight: 87.1 kg (192 lb 0.3 oz) (scale B) IBW/kg (Calculated) : 73 Heparin Dosing Weight: 80kg  Vital Signs: Temp: 97.6 F (36.4 C) (07/28 1153) Temp Source: Axillary (07/28 1153) BP: 122/100 (07/28 1153) Pulse Rate: 75 (07/28 1153)  Labs: Recent Labs    07/03/22 0355 07/04/22 0315 07/05/22 0456  HGB 9.8* 9.5* 10.1*  HCT 30.5* 29.1* 31.6*  PLT 190 188 187  LABPROT 20.3* 22.0* 21.9*  INR 1.8* 1.9* 1.9*  HEPARINUNFRC <0.10*  --   --   CREATININE 1.37* 1.36* 1.36*     Estimated Creatinine Clearance: 58.9 mL/min (A) (by C-G formula based on SCr of 1.36 mg/dL (H)).   Medical History: Past Medical History:  Diagnosis Date   "    Arthritis    CAD (coronary artery disease)    a. s/p CABG in 11/2019 with LIMA-LAD, SVG-OM1, SVG-PDA and SVG-D1   CHF (congestive heart failure) (HCC)    a. EF < 20% by echo in 11/2019   Essential hypertension    PAF (paroxysmal atrial fibrillation) (HCC)    Type 2 diabetes mellitus (HCC)     Assessment: 38 yoM with HM3 LVAD admitted with ongoing LVAD driveline infection. Pt with planned debridements in OR. Pt is on warfarin PTA with last dose taken 7/3.   Pt s/p OR for infected wound debridement 7/7, 7/13,7/20 - next planned 7/31. Per discussion with surgeon keep INR ~2 until debridement next week.  INR 1.9 this morning, cbc stable with no bleeding noted. LDH stable <150.   INR goal ~1.4-1.7 range for OR 7/31, will decrease warfarin dosing for tonight.   *Home warfarin dose 6mg  MWF, 4mg  AODs  Goal of Therapy:  INR goal 2-2.5 - keep closer to 1.4-1.7 for OR next week Heparin level < 0.3 units/ml Monitor platelets by anticoagulation protocol: Yes   Plan:  Warfarin 4 mg tonight Daily INR, heparin level, CBC, LDH  8/31 PharmD., BCPS Clinical Pharmacist 07/05/2022 12:08 PM

## 2022-07-06 DIAGNOSIS — Z95811 Presence of heart assist device: Secondary | ICD-10-CM | POA: Diagnosis not present

## 2022-07-06 DIAGNOSIS — T827XXA Infection and inflammatory reaction due to other cardiac and vascular devices, implants and grafts, initial encounter: Secondary | ICD-10-CM | POA: Diagnosis not present

## 2022-07-06 LAB — BASIC METABOLIC PANEL
Anion gap: 6 (ref 5–15)
BUN: 31 mg/dL — ABNORMAL HIGH (ref 8–23)
CO2: 28 mmol/L (ref 22–32)
Calcium: 9 mg/dL (ref 8.9–10.3)
Chloride: 104 mmol/L (ref 98–111)
Creatinine, Ser: 1.29 mg/dL — ABNORMAL HIGH (ref 0.61–1.24)
GFR, Estimated: >60 mL/min (ref >60)
Glucose, Bld: 149 mg/dL — ABNORMAL HIGH (ref 70–99)
Potassium: 4.1 mmol/L (ref 3.5–5.1)
Sodium: 138 mmol/L (ref 135–145)

## 2022-07-06 LAB — CBC
HCT: 29.2 % — ABNORMAL LOW (ref 39.0–52.0)
Hemoglobin: 9.5 g/dL — ABNORMAL LOW (ref 13.0–17.0)
MCH: 28.3 pg (ref 26.0–34.0)
MCHC: 32.5 g/dL (ref 30.0–36.0)
MCV: 86.9 fL (ref 80.0–100.0)
Platelets: 174 10*3/uL (ref 150–400)
RBC: 3.36 MIL/uL — ABNORMAL LOW (ref 4.22–5.81)
RDW: 18.6 % — ABNORMAL HIGH (ref 11.5–15.5)
WBC: 7.5 10*3/uL (ref 4.0–10.5)
nRBC: 0 % (ref 0.0–0.2)

## 2022-07-06 LAB — LACTATE DEHYDROGENASE: LDH: 177 U/L (ref 98–192)

## 2022-07-06 LAB — GLUCOSE, CAPILLARY
Glucose-Capillary: 138 mg/dL — ABNORMAL HIGH (ref 70–99)
Glucose-Capillary: 102 mg/dL — ABNORMAL HIGH (ref 70–99)
Glucose-Capillary: 125 mg/dL — ABNORMAL HIGH (ref 70–99)
Glucose-Capillary: 167 mg/dL — ABNORMAL HIGH (ref 70–99)

## 2022-07-06 LAB — PROTIME-INR
INR: 2 — ABNORMAL HIGH (ref 0.8–1.2)
Prothrombin Time: 22.5 seconds — ABNORMAL HIGH (ref 11.4–15.2)

## 2022-07-06 NOTE — Plan of Care (Signed)
  Problem: Education: Goal: Knowledge of General Education information will improve Description: Including pain rating scale, medication(s)/side effects and non-pharmacologic comfort measures Outcome: Progressing   Problem: Health Behavior/Discharge Planning: Goal: Ability to manage health-related needs will improve Outcome: Progressing   Problem: Clinical Measurements: Goal: Ability to maintain clinical measurements within normal limits will improve Outcome: Progressing Goal: Will remain free from infection Outcome: Progressing Goal: Diagnostic test results will improve Outcome: Progressing Goal: Respiratory complications will improve Outcome: Progressing Goal: Cardiovascular complication will be avoided Outcome: Progressing   Problem: Activity: Goal: Risk for activity intolerance will decrease Outcome: Progressing   Problem: Nutrition: Goal: Adequate nutrition will be maintained Outcome: Progressing   Problem: Coping: Goal: Level of anxiety will decrease Outcome: Progressing   Problem: Elimination: Goal: Will not experience complications related to bowel motility Outcome: Progressing Goal: Will not experience complications related to urinary retention Outcome: Progressing   Problem: Pain Managment: Goal: General experience of comfort will improve Outcome: Progressing   Problem: Safety: Goal: Ability to remain free from injury will improve Outcome: Progressing   Problem: Skin Integrity: Goal: Risk for impaired skin integrity will decrease Outcome: Progressing   Problem: Education: Goal: Ability to describe self-care measures that may prevent or decrease complications (Diabetes Survival Skills Education) will improve Outcome: Progressing Goal: Individualized Educational Video(s) Outcome: Progressing   Problem: Coping: Goal: Ability to adjust to condition or change in health will improve Outcome: Progressing   Problem: Fluid Volume: Goal: Ability to  maintain a balanced intake and output will improve Outcome: Progressing   Problem: Health Behavior/Discharge Planning: Goal: Ability to identify and utilize available resources and services will improve Outcome: Progressing Goal: Ability to manage health-related needs will improve Outcome: Progressing   Problem: Metabolic: Goal: Ability to maintain appropriate glucose levels will improve Outcome: Progressing   Problem: Nutritional: Goal: Maintenance of adequate nutrition will improve Outcome: Progressing Goal: Progress toward achieving an optimal weight will improve Outcome: Progressing   Problem: Skin Integrity: Goal: Risk for impaired skin integrity will decrease Outcome: Progressing   Problem: Tissue Perfusion: Goal: Adequacy of tissue perfusion will improve Outcome: Progressing   Problem: Education: Goal: Ability to demonstrate management of disease process will improve Outcome: Progressing Goal: Ability to verbalize understanding of medication therapies will improve Outcome: Progressing Goal: Individualized Educational Video(s) Outcome: Progressing   Problem: Activity: Goal: Capacity to carry out activities will improve Outcome: Progressing   Problem: Cardiac: Goal: Ability to achieve and maintain adequate cardiopulmonary perfusion will improve Outcome: Progressing   Problem: Education: Goal: Patient will understand all VAD equipment and how it functions Outcome: Progressing Goal: Patient will be able to verbalize current INR target range and antiplatelet therapy for discharge home Outcome: Progressing   Problem: Cardiac: Goal: LVAD will function as expected and patient will experience no clinical alarms Outcome: Progressing   

## 2022-07-06 NOTE — Plan of Care (Signed)
  Problem: Education: Goal: Knowledge of General Education information will improve Description: Including pain rating scale, medication(s)/side effects and non-pharmacologic comfort measures Outcome: Progressing   Problem: Health Behavior/Discharge Planning: Goal: Ability to manage health-related needs will improve Outcome: Progressing   Problem: Clinical Measurements: Goal: Ability to maintain clinical measurements within normal limits will improve Outcome: Progressing Goal: Will remain free from infection Outcome: Progressing Goal: Diagnostic test results will improve Outcome: Progressing Goal: Respiratory complications will improve Outcome: Progressing Goal: Cardiovascular complication will be avoided Outcome: Progressing   Problem: Activity: Goal: Risk for activity intolerance will decrease Outcome: Progressing   Problem: Nutrition: Goal: Adequate nutrition will be maintained Outcome: Progressing   Problem: Coping: Goal: Level of anxiety will decrease Outcome: Progressing   Problem: Elimination: Goal: Will not experience complications related to bowel motility Outcome: Progressing Goal: Will not experience complications related to urinary retention Outcome: Progressing   Problem: Pain Managment: Goal: General experience of comfort will improve Outcome: Progressing   Problem: Safety: Goal: Ability to remain free from injury will improve Outcome: Progressing   Problem: Skin Integrity: Goal: Risk for impaired skin integrity will decrease Outcome: Progressing   Problem: Education: Goal: Ability to describe self-care measures that may prevent or decrease complications (Diabetes Survival Skills Education) will improve Outcome: Progressing Goal: Individualized Educational Video(s) Outcome: Progressing   Problem: Education: Goal: Individualized Educational Video(s) Outcome: Progressing   Problem: Education: Goal: Individualized Educational Video(s) Outcome:  Progressing   Problem: Coping: Goal: Ability to adjust to condition or change in health will improve Outcome: Progressing   Problem: Fluid Volume: Goal: Ability to maintain a balanced intake and output will improve Outcome: Progressing   Problem: Health Behavior/Discharge Planning: Goal: Ability to identify and utilize available resources and services will improve Outcome: Progressing Goal: Ability to manage health-related needs will improve Outcome: Progressing   Problem: Metabolic: Goal: Ability to maintain appropriate glucose levels will improve Outcome: Progressing   Problem: Nutritional: Goal: Maintenance of adequate nutrition will improve Outcome: Progressing Goal: Progress toward achieving an optimal weight will improve Outcome: Progressing   Problem: Skin Integrity: Goal: Risk for impaired skin integrity will decrease Outcome: Progressing   Problem: Tissue Perfusion: Goal: Adequacy of tissue perfusion will improve Outcome: Progressing

## 2022-07-06 NOTE — Progress Notes (Signed)
Patient ID: Kevin Underwood, male   DOB: 1961/01/21, 61 y.o.   MRN: [id number]   Advanced Heart Failure VAD Team Note  PCP-Cardiologist: Nona Dell, MD   Subjective:    S/p driveline site debridement and wound vac on 7/7.  7/10 Had Ramp ECHO. Speed optimized. 7/13 OR to [name] dressing change.  Back on Heparin drip.  7/20 OR for debridement and VAC dressing change  MAP generally 90s.  Creatinine stable 1.29.     Fells good today, encouraged to ambulate. Denies CP/SOB. Driveline site not painful.   LVAD INTERROGATION:  HeartMate III LVAD:   Flow 4.4 liters/min, speed 5600, power 4  PI 3.2. VAD interrogated personally. Parameters stable.  Objective:    Vital Signs:   Temp:  [97.6 F (36.4 C)-97.9 F (36.6 C)] 97.6 F (36.4 C) (07/29 0611) Pulse Rate:  [61-86] 76 (07/29 0611) Resp:  [12-20] 19 (07/29 0611) BP: (76-122)/(58-100) 121/98 (07/29 0611) SpO2:  [92 %-98 %] 97 % (07/29 0611) Last BM Date : 07/04/22 Mean arterial Pressure  90s Intake/Output:   Intake/Output Summary (Last 24 hours) at 07/06/2022 0826 Last data filed at 07/05/2022 2358 Gross per 24 hour  Intake 300 ml  Output 0 ml  Net 300 ml     Physical Exam   General: Well appearing this am. NAD.  HEENT: Normal. Neck: Supple, JVP 7-8 cm. Carotids OK.  Cardiac:  Mechanical heart sounds with LVAD hum present.  Lungs:  CTAB, normal effort.  Abdomen:  NT, ND, no HSM. No bruits or masses. +BS  LVAD exit site: Wound vac present.  Extremities:  Warm and dry. No cyanosis, clubbing, rash, or edema.  Neuro:  Alert & oriented x 3. Cranial nerves grossly intact. Moves all 4 extremities w/o difficulty. Affect pleasant    Telemetry    NSR 80's (Personally reviewed)    Labs   Basic Metabolic Panel: Recent Labs  Lab 07/02/22 0037 07/03/22 0355 07/04/22 0315 07/05/22 0456 07/06/22 0545  NA 135 137 137 136 138  K 4.0 4.2 4.1 4.0 4.1  CL 104 104 105 103 104  CO2 22 27 27 26 28   GLUCOSE 116* 140* 135* 99  149*  BUN 28* 32* 33* 32* 31*  CREATININE 1.36* 1.37* 1.36* 1.36* 1.29*  CALCIUM 8.9 9.0 8.9 9.1 9.0    Liver Function Tests: No results for input(s): "AST", "ALT", "ALKPHOS", "BILITOT", "PROT", "ALBUMIN" in the last 168 hours.  No results for input(s): "LIPASE", "AMYLASE" in the last 168 hours. No results for input(s): "AMMONIA" in the last 168 hours.  CBC: Recent Labs  Lab 06/30/22 0604 07/03/22 0355 07/04/22 0315 07/05/22 0456 07/06/22 0545  WBC 9.3 7.7 7.7 8.4 7.5  HGB 10.6* 9.8* 9.5* 10.1* 9.5*  HCT 32.7* 30.5* 29.1* 31.6* 29.2*  MCV 87.4 88.7 87.1 88.0 86.9  PLT 216 190 188 187 174    INR: Recent Labs  Lab 07/02/22 0037 07/03/22 0355 07/04/22 0315 07/05/22 0456 07/06/22 0545  INR 1.8* 1.8* 1.9* 1.9* 2.0*    Other results:   Imaging   No results found.   Medications:     Scheduled Medications:  allopurinol  100 mg Oral Daily   Chlorhexidine Gluconate Cloth  6 each Topical Daily   docusate sodium  200 mg Oral BID   gabapentin  600 mg Oral BID   guaiFENesin  600 mg Oral BID   hydrALAZINE  25 mg Oral TID   insulin aspart  0-15 Units Subcutaneous TID WC   insulin  aspart  0-5 Units Subcutaneous QHS   insulin aspart  3 Units Subcutaneous TID WC   insulin detemir  15 Units Subcutaneous QHS   lactose free nutrition  237 mL Oral TID WC   loratadine  10 mg Oral Daily   methimazole  20 mg Oral BID   metoprolol succinate  25 mg Oral Daily   pantoprazole  40 mg Oral Daily   polyethylene glycol  17 g Oral Daily   rosuvastatin  20 mg Oral Daily   sertraline  50 mg Oral Daily   tamsulosin  0.4 mg Oral Daily   traZODone  150 mg Oral QHS   Warfarin - Pharmacist Dosing Inpatient   Does not apply q1600    Infusions:  vancomycin 1,000 mg (07/05/22 2115)    PRN Medications: acetaminophen, albuterol, morphine injection, ondansetron (ZOFRAN) IV, mouth rinse, sorbitol, traMADol   Patient Profile   61 y.o. male with history of HM III LVAD, chronic  systolic CHF/ICM, COPD, PAF. 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   Readmitted for driveline infection and surgical debridement/possible wound vac placement.  Assessment/Plan:    1. Driveline infection: Recent driveline infection [id number]. S/p driveline site debridement 6/8. Surgical wound cultures grew MRSA, repeat debridement 6/23. Driveline debridement with wound vac placement on 7/7. 7/13, 7/20 VAC dressing change. Wound vac with milky/cloudy drainage today. - Transitioned from daptomycin to vancomycin due to elevated CK.  Vancomycin planned x 6 wks, end date 07/18/22.  - Back to OR 7/31 for debridement and Vac change - Keep INR 1.4-1.7 range as he has to go back to OR, 2 => hold warfarin for now to allow it to trend down, heparin when INR < 1.7.  2.  Chronic systolic CHF: Echo [id number] with EF 20-25%, mildly decreased RV function. LHC/RHC in 12/21 with patent grafts, low output. Suspect mixed ischemic/nonischemic cardiomyopathy (prior heavy ETOH and drugs as well as CAD).  No ETOH, drugs, smoking since CABG in 12/20. Admitted with cardiogenic shock in 12/21, had placement of Impella 5.5 initially, now s/p Heartmate 3 LVAD on 11/17/20.  Ramp echo 2/23 with speed decreased to 5500 rpm. He would eventually like heart transplant. NYHA class II. Have been allowing MAP to run higher due to recent AKI thought to be due to lowering BP too much in setting of right renal artery stenosis. Had Ramp ECHO with speed optimized. Volume status stable. MAP currently 90s. - INR 2 today, hold warfarin to allow to trend down for OR and use heparin when INR < 1.8.  - Continue Toprol XL 25 mg daily.  - Continue hydralazine 25 mg tid.  - PICC placed for long-term antibiotic use 3. AKI: Creatinine up to 4.6 last admission, initially thought this was ATN from low BP but he was never markedly hypotensive and LFTs without any evidence of shock liver. ? Vanc toxicity (troughs were ok).  Taken off Entresto and hydralazine. Volume repleted with IVF. In reviewing prior CTA abdomen/pelvis, he has high-grade stenosis of proximal right renal artery. D/w Renal and feel like he may need higher perfusion pressures. Scr now stable, 1.29 today. MAP in 90s is ok for him.  - Would consider reassessment of renal artery stenosis and possible treatment once creatinine has improved to baseline.  4.  CAD: S/p CABG 12/20.  LHC pre-VAD with patent grafts, no target for intervention. - Continue statin.  5.  Atrial fibrillation: Paroxysmal.  S/p Maze and LA appendage clip with CABG  in 12/20. In NSR today  - Continue Toprol XL 25 mg daily.  - Off amiodarone with hyperthyroidism. 6.  Type 2 diabetes: Takes glipizide at home.  - SSI  - Continue Levemir 15 units qHS - Diabetes coordinator following 7. 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  8. 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  He missed his endocrinology appt. Has been rescheduled for July 27th, will need to reschedule again. - He is now off amiodarone.   - Continue methimazole 20 mg bid, TSH was mildly elevated.   9.  Gout: On allopurinol 10. Anemia (chronic blood loss): Got 1u RBCs on 7/12. Hgb 9.5 today    I reviewed the LVAD parameters from today, and compared the results to the patient's prior recorded data.  No programming changes were made.  The LVAD is functioning within specified parameters.  The patient performs LVAD self-test daily.  LVAD interrogation was negative for any significant power changes, alarms or PI events/speed drops.  LVAD equipment check completed and is in good working order.  Back-up equipment present.   LVAD education done on emergency procedures and precautions and reviewed exit site care.  Length of Stay: 24  Marca Ancona. 07/06/2022 8:26 AM

## 2022-07-06 NOTE — Progress Notes (Signed)
ANTICOAGULATION CONSULT NOTE  Pharmacy Consult for warfarin Indication:  LVAD  No Known Allergies  Patient Measurements: Height: 5\' 10"  (177.8 cm) Weight: 87.1 kg (192 lb 0.3 oz) (scale B) IBW/kg (Calculated) : 73 Heparin Dosing Weight: 80kg  Vital Signs: Temp: 98 F (36.7 C) (07/29 0900) Temp Source: Oral (07/29 0900) BP: 142/84 (07/29 0900) Pulse Rate: 76 (07/29 0611)  Labs: Recent Labs    07/04/22 0315 07/05/22 0456 07/06/22 0545  HGB 9.5* 10.1* 9.5*  HCT 29.1* 31.6* 29.2*  PLT 188 187 174  LABPROT 22.0* 21.9* 22.5*  INR 1.9* 1.9* 2.0*  CREATININE 1.36* 1.36* 1.29*     Estimated Creatinine Clearance: 62.1 mL/min (A) (by C-G formula based on SCr of 1.29 mg/dL (H)).   Medical History: Past Medical History:  Diagnosis Date   "    Arthritis    CAD (coronary artery disease)    a. s/p CABG in 11/2019 with LIMA-LAD, SVG-OM1, SVG-PDA and SVG-D1   CHF (congestive heart failure) (HCC)    a. EF < 20% by echo in 11/2019   Essential hypertension    PAF (paroxysmal atrial fibrillation) (HCC)    Type 2 diabetes mellitus (HCC)     Assessment: 65 yoM with HM3 LVAD admitted with ongoing LVAD driveline infection. Pt with planned debridements in OR. Pt is on warfarin PTA with last dose taken 7/3.   Pt s/p OR for infected wound debridement 7/7, 7/13,7/20 - next planned 7/31. Per discussion with surgeon keep INR ~2 until debridement next week.  INR 2 this morning, cbc stable with no bleeding noted. LDH stable <150. Per 8/31 - hold warfarin for now, start heparin once INR <1.8.  *Home warfarin dose 6mg  MWF, 4mg  AODs  Goal of Therapy:  INR goal 2-2.5 - keep closer to 1.4-1.7 for OR next week Heparin level < 0.3 units/ml Monitor platelets by anticoagulation protocol: Yes   Plan:  Hold warfarin Start heparin 500 units/h no titrations once INR <1.8  Shirlee Latch, PharmD, BCPS, [facility] Clinical Pharmacist [phone] Please check AMION for all [facility] Pharm[facility]  numbers 07/06/2022

## 2022-07-07 DIAGNOSIS — Z95811 Presence of heart assist device: Secondary | ICD-10-CM | POA: Diagnosis not present

## 2022-07-07 DIAGNOSIS — T827XXA Infection and inflammatory reaction due to other cardiac and vascular devices, implants and grafts, initial encounter: Secondary | ICD-10-CM | POA: Diagnosis not present

## 2022-07-07 LAB — CBC
HCT: 29.6 % — ABNORMAL LOW (ref 39.0–52.0)
Hemoglobin: 9.8 g/dL — ABNORMAL LOW (ref 13.0–17.0)
MCH: 28.7 pg (ref 26.0–34.0)
MCHC: 33.1 g/dL (ref 30.0–36.0)
MCV: 86.8 fL (ref 80.0–100.0)
Platelets: 176 10*3/uL (ref 150–400)
RBC: 3.41 MIL/uL — ABNORMAL LOW (ref 4.22–5.81)
RDW: 18.5 % — ABNORMAL HIGH (ref 11.5–15.5)
WBC: 7 10*3/uL (ref 4.0–10.5)
nRBC: 0 % (ref 0.0–0.2)

## 2022-07-07 LAB — GLUCOSE, CAPILLARY
Glucose-Capillary: 87 mg/dL (ref 70–99)
Glucose-Capillary: 188 mg/dL — ABNORMAL HIGH (ref 70–99)
Glucose-Capillary: 301 mg/dL — ABNORMAL HIGH (ref 70–99)
Glucose-Capillary: 436 mg/dL — ABNORMAL HIGH (ref 70–99)
Glucose-Capillary: 134 mg/dL — ABNORMAL HIGH (ref 70–99)

## 2022-07-07 LAB — BASIC METABOLIC PANEL
Anion gap: 6 (ref 5–15)
BUN: 33 mg/dL — ABNORMAL HIGH (ref 8–23)
CO2: 25 mmol/L (ref 22–32)
Calcium: 9 mg/dL (ref 8.9–10.3)
Chloride: 104 mmol/L (ref 98–111)
Creatinine, Ser: 1.29 mg/dL — ABNORMAL HIGH (ref 0.61–1.24)
GFR, Estimated: >60 mL/min (ref >60)
Glucose, Bld: 103 mg/dL — ABNORMAL HIGH (ref 70–99)
Potassium: 3.9 mmol/L (ref 3.5–5.1)
Sodium: 135 mmol/L (ref 135–145)

## 2022-07-07 LAB — TYPE AND SCREEN
ABO/RH(D): B POS
Antibody Screen: NEGATIVE

## 2022-07-07 LAB — LACTATE DEHYDROGENASE: LDH: 135 U/L (ref 98–192)

## 2022-07-07 LAB — PROTIME-INR
INR: 1.7 — ABNORMAL HIGH (ref 0.8–1.2)
Prothrombin Time: 19.8 seconds — ABNORMAL HIGH (ref 11.4–15.2)

## 2022-07-07 MED ORDER — HEPARIN (PORCINE) 25000 UT/250ML-% IV SOLN
500.0000 [IU]/h | INTRAVENOUS | Status: DC
  Administered 2022-07-07: 500 [IU]/h via INTRAVENOUS
  Filled 2022-07-07: qty 250

## 2022-07-07 MED ORDER — CEFAZOLIN SODIUM-DEXTROSE 2-4 GM/100ML-% IV SOLN
2.0000 g | INTRAVENOUS | Status: AC
  Administered 2022-07-08: 2 g via INTRAVENOUS
  Filled 2022-07-07: qty 100

## 2022-07-07 NOTE — Progress Notes (Signed)
Patient ID: Kevin Underwood, male   DOB: Apr 23, 1961, 61 y.o.   MRN: [id number]   Advanced Heart Failure VAD Team Note  PCP-Cardiologist: Rozann Lesches, MD   Subjective:    S/p driveline site debridement and wound vac on 7/7.  7/10 Had Ramp ECHO. Speed optimized. 7/13 OR to [facility] dressing change.  Back on Heparin drip.  7/20 OR for debridement and VAC dressing change  MAP generally 90s.  Creatinine stable 1.29.  INR 1.7 today.   Feels good overall, walking a lot in the halls. Denies CP/SOB. Driveline site not painful.   LVAD INTERROGATION:  HeartMate III LVAD:   Flow 4.1 liters/min, speed 5600, power 4  PI 3.5. VAD interrogated personally. Parameters stable.  Objective:    Vital Signs:   Temp:  [97.6 F (36.4 C)-98.1 F (36.7 C)] 97.6 F (36.4 C) (07/30 0727) Pulse Rate:  [69-87] 72 (07/30 0727) Resp:  [12-20] 14 (07/30 0727) BP: (101-125)/(60-92) 116/87 (07/30 0727) SpO2:  [95 %-97 %] 96 % (07/30 0727) Weight:  [86.5 kg] 86.5 kg (07/30 0638) Last BM Date : 07/06/22 Mean arterial Pressure  90s Intake/Output:   Intake/Output Summary (Last 24 hours) at 07/07/2022 0903 Last data filed at 07/07/2022 0732 Gross per 24 hour  Intake 1420 ml  Output 200 ml  Net 1220 ml     Physical Exam   General: Well appearing this am. NAD.  HEENT: Normal. Neck: Supple, JVP 7-8 cm. Carotids OK.  Cardiac:  Mechanical heart sounds with LVAD hum present.  Lungs:  CTAB, normal effort.  Abdomen:  NT, ND, no HSM. No bruits or masses. +BS  LVAD exit site: Wound vac Extremities:  Warm and dry. No cyanosis, clubbing, rash, or edema.  Neuro:  Alert & oriented x 3. Cranial nerves grossly intact. Moves all 4 extremities w/o difficulty. Affect pleasant    Telemetry    NSR 80's (Personally reviewed)    Labs   Basic Metabolic Panel: Recent Labs  Lab 07/03/22 0355 07/04/22 0315 07/05/22 0456 07/06/22 0545 07/07/22 0417  NA 137 137 136 138 135  K 4.2 4.1 4.0 4.1 3.9  CL 104 105 103 104  104  CO2 27 27 26 28 25   GLUCOSE 140* 135* 99 149* 103*  BUN 32* 33* 32* 31* 33*  CREATININE 1.37* 1.36* 1.36* 1.29* 1.29*  CALCIUM 9.0 8.9 9.1 9.0 9.0    Liver Function Tests: No results for input(s): "AST", "ALT", "ALKPHOS", "BILITOT", "PROT", "ALBUMIN" in the last 168 hours.  No results for input(s): "LIPASE", "AMYLASE" in the last 168 hours. No results for input(s): "AMMONIA" in the last 168 hours.  CBC: Recent Labs  Lab 07/03/22 0355 07/04/22 0315 07/05/22 0456 07/06/22 0545 07/07/22 0417  WBC 7.7 7.7 8.4 7.5 7.0  HGB 9.8* 9.5* 10.1* 9.5* 9.8*  HCT 30.5* 29.1* 31.6* 29.2* 29.6*  MCV 88.7 87.1 88.0 86.9 86.8  PLT 190 188 187 174 176    INR: Recent Labs  Lab 07/03/22 0355 07/04/22 0315 07/05/22 0456 07/06/22 0545 07/07/22 0417  INR 1.8* 1.9* 1.9* 2.0* 1.7*    Other results:   Imaging   No results found.   Medications:     Scheduled Medications:  allopurinol  100 mg Oral Daily   Chlorhexidine Gluconate Cloth  6 each Topical Daily   docusate sodium  200 mg Oral BID   gabapentin  600 mg Oral BID   guaiFENesin  600 mg Oral BID   hydrALAZINE  25 mg Oral TID  insulin aspart  0-15 Units Subcutaneous TID WC   insulin aspart  0-5 Units Subcutaneous QHS   insulin aspart  3 Units Subcutaneous TID WC   insulin detemir  15 Units Subcutaneous QHS   lactose free nutrition  237 mL Oral TID WC   loratadine  10 mg Oral Daily   methimazole  20 mg Oral BID   metoprolol succinate  25 mg Oral Daily   pantoprazole  40 mg Oral Daily   polyethylene glycol  17 g Oral Daily   rosuvastatin  20 mg Oral Daily   sertraline  50 mg Oral Daily   tamsulosin  0.4 mg Oral Daily   traZODone  150 mg Oral QHS    Infusions:  heparin     vancomycin 1,000 mg (07/06/22 2045)    PRN Medications: acetaminophen, albuterol, morphine injection, ondansetron (ZOFRAN) IV, mouth rinse, sorbitol, traMADol   Patient Profile   61 y.o. male with history of HM III LVAD, chronic  systolic CHF/ICM, COPD, PAF. 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   Readmitted for driveline infection and surgical debridement/possible wound vac placement.  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 S/p driveline site debridement 6/8. Surgical wound cultures grew MRSA, repeat debridement 6/23. Driveline debridement with wound vac placement on 7/7. 7/13, 7/20 VAC dressing change. Wound vac with milky/cloudy drainage today. - Transitioned from daptomycin to vancomycin due to elevated CK.  Vancomycin planned x 6 wks, end date 07/18/22.  - Back to OR 7/31 for debridement and Vac change, NPO at midnight.  - Keep INR 1.4-1.7 range as he has to go back to OR, 1.7 today => starting heparin gtt.  2.  Chronic systolic CHF: Echo 10/21 with EF 20-25%, mildly decreased RV function. LHC/RHC in 12/21 with patent grafts, low output. Suspect mixed ischemic/nonischemic cardiomyopathy (prior heavy ETOH and drugs as well as CAD).  No ETOH, drugs, smoking since CABG in 12/20. Admitted with cardiogenic shock in 12/21, had placement of Impella 5.5 initially, now s/p Heartmate 3 LVAD on 11/17/20.  Ramp echo 2/23 with speed decreased to 5500 rpm. He would eventually like heart transplant. NYHA class II. Have been allowing MAP to run higher due to recent AKI thought to be due to lowering BP too much in setting of right renal artery stenosis. Had Ramp ECHO with speed optimized. Volume status stable. MAP currently 90s. - INR 1.7 today, starting heparin gtt.  - Continue Toprol XL 25 mg daily.  - Continue hydralazine 25 mg tid.  - PICC placed for long-term antibiotic use 3. AKI: Creatinine up to 4.6 last admission, initially thought this was ATN from low BP but he was never markedly hypotensive and LFTs without any evidence of shock liver. ? Vanc toxicity (troughs were ok). Taken off Entresto and hydralazine. Volume repleted with IVF. In reviewing  prior CTA abdomen/pelvis, he has high-grade stenosis of proximal right renal artery. D/w Renal and feel like he may need higher perfusion pressures. Scr now stable, 1.29 today. MAP in 90s is ok for him.  - Would consider reassessment of renal artery stenosis and possible treatment once creatinine has improved to baseline.  4.  CAD: S/p CABG 12/20.  LHC pre-VAD with patent grafts, no target for intervention. - Continue statin.  5.  Atrial fibrillation: Paroxysmal.  S/p Maze and LA appendage clip with CABG in 12/20. In NSR today  - Continue Toprol XL 25 mg daily.  - Off amiodarone  with hyperthyroidism. 6.  Type 2 diabetes: Takes glipizide at home.  - SSI  - Continue Levemir 15 units qHS - Diabetes coordinator following 7. 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  8. 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  He missed his endocrinology appt. Has been rescheduled for July 27th, will need to reschedule again. - He is now off amiodarone.   - Continue methimazole 20 mg bid, TSH was mildly elevated.   9.  Gout: On allopurinol 10. Anemia (chronic blood loss): Got 1u RBCs on 7/12. Hgb 9.8 today    I reviewed the LVAD parameters from today, and compared the results to the patient's prior recorded data.  No programming changes were made.  The LVAD is functioning within specified parameters.  The patient performs LVAD self-test daily.  LVAD interrogation was negative for any significant power changes, alarms or PI events/speed drops.  LVAD equipment check completed and is in good working order.  Back-up equipment present.   LVAD education done on emergency procedures and precautions and reviewed exit site care.  Length of Stay: [address]. 07/07/2022 9:03 AM

## 2022-07-07 NOTE — Progress Notes (Addendum)
ANTICOAGULATION CONSULT NOTE  Pharmacy Consult for warfarin > heparin Indication:  LVAD  No Known Allergies  Patient Measurements: Height: 5\' 10"  (177.8 cm) Weight: 86.5 kg (190 lb 11.2 oz) (Scale B) IBW/kg (Calculated) : 73 Heparin Dosing Weight: 80kg  Vital Signs: Temp: 97.8 F (36.6 C) (07/30 0326) Temp Source: Oral (07/30 0326) BP: 108/92 (07/30 0326) Pulse Rate: 80 (07/30 0326)  Labs: Recent Labs    07/05/22 0456 07/06/22 0545 07/07/22 0417  HGB 10.1* 9.5* 9.8*  HCT 31.6* 29.2* 29.6*  PLT 187 174 176  LABPROT 21.9* 22.5* 19.8*  INR 1.9* 2.0* 1.7*  CREATININE 1.36* 1.29* 1.29*     Estimated Creatinine Clearance: 62.1 mL/min (A) (by C-G formula based on SCr of 1.29 mg/dL (H)).   Medical History: Past Medical History:  Diagnosis Date   "    Arthritis    CAD (coronary artery disease)    a. s/p CABG in 11/2019 with LIMA-LAD, SVG-OM1, SVG-PDA and SVG-D1   CHF (congestive heart failure) (HCC)    a. EF < 20% by echo in 11/2019   Essential hypertension    PAF (paroxysmal atrial fibrillation) (HCC)    Type 2 diabetes mellitus (HCC)     Assessment: 4 yoM with HM3 LVAD admitted with ongoing LVAD driveline infection. Pt with planned debridements in OR. Pt is on warfarin PTA with last dose taken 7/3.   Pt s/p OR for infected wound debridement 7/7, 7/13,7/20 - next planned 7/31. Per discussion with surgeon keep INR ~2 until debridement next week.  INR 1.7 this morning, warfarin on hold over the weekend for debridement tomorrow. Will begin low-dose IV heparin to cover.  *Home warfarin dose 6mg  MWF, 4mg  AODs  Goal of Therapy:  INR goal 2-2.5 - keep closer to 1.4-1.7 for OR next week Heparin level < 0.3 units/ml Monitor platelets by anticoagulation protocol: Yes   Plan:  Hold warfarin Start heparin 500 units/h no titrations Pt previously undetectable at 500 units/h as expected, will defer heparin level until tomorrow am   8/31, PharmD, BCPS,  [facility] Clinical Pharmacist [phone] Please check AMION for all [facility] Pharmacy numbers 07/07/2022

## 2022-07-08 ENCOUNTER — Encounter (HOSPITAL_COMMUNITY)
Admission: AD | Disposition: A | Discharge status: Home / Self Care | Payer: Self-pay | Source: Home / Self Care | Attending: Cardiology

## 2022-07-08 ENCOUNTER — Inpatient Hospital Stay (HOSPITAL_COMMUNITY): Payer: 59 | Admitting: Certified Registered"

## 2022-07-08 ENCOUNTER — Other Ambulatory Visit: Payer: Self-pay

## 2022-07-08 DIAGNOSIS — I110 Hypertensive heart disease with heart failure: Secondary | ICD-10-CM

## 2022-07-08 DIAGNOSIS — I25119 Atherosclerotic heart disease of native coronary artery with unspecified angina pectoris: Secondary | ICD-10-CM | POA: Diagnosis not present

## 2022-07-08 DIAGNOSIS — I509 Heart failure, unspecified: Secondary | ICD-10-CM | POA: Diagnosis not present

## 2022-07-08 DIAGNOSIS — T827XXA Infection and inflammatory reaction due to other cardiac and vascular devices, implants and grafts, initial encounter: Secondary | ICD-10-CM

## 2022-07-08 DIAGNOSIS — S31109A Unspecified open wound of abdominal wall, unspecified quadrant without penetration into peritoneal cavity, initial encounter: Secondary | ICD-10-CM | POA: Diagnosis not present

## 2022-07-08 HISTORY — PX: STERNAL WOUND DEBRIDEMENT: SHX1058

## 2022-07-08 HISTORY — PX: APPLICATION OF WOUND VAC: SHX5189

## 2022-07-08 LAB — CBC
HCT: 29.6 % — ABNORMAL LOW (ref 39.0–52.0)
Hemoglobin: 9.8 g/dL — ABNORMAL LOW (ref 13.0–17.0)
MCH: 28.3 pg (ref 26.0–34.0)
MCHC: 33.1 g/dL (ref 30.0–36.0)
MCV: 85.5 fL (ref 80.0–100.0)
Platelets: 189 10*3/uL (ref 150–400)
RBC: 3.46 MIL/uL — ABNORMAL LOW (ref 4.22–5.81)
RDW: 18.4 % — ABNORMAL HIGH (ref 11.5–15.5)
WBC: 8.1 10*3/uL (ref 4.0–10.5)
nRBC: 0 % (ref 0.0–0.2)

## 2022-07-08 LAB — GLUCOSE, CAPILLARY
Glucose-Capillary: 91 mg/dL (ref 70–99)
Glucose-Capillary: 104 mg/dL — ABNORMAL HIGH (ref 70–99)
Glucose-Capillary: 234 mg/dL — ABNORMAL HIGH (ref 70–99)
Glucose-Capillary: 334 mg/dL — ABNORMAL HIGH (ref 70–99)
Glucose-Capillary: 142 mg/dL — ABNORMAL HIGH (ref 70–99)

## 2022-07-08 LAB — BASIC METABOLIC PANEL
Anion gap: 8 (ref 5–15)
BUN: 32 mg/dL — ABNORMAL HIGH (ref 8–23)
CO2: 25 mmol/L (ref 22–32)
Calcium: 8.9 mg/dL (ref 8.9–10.3)
Chloride: 104 mmol/L (ref 98–111)
Creatinine, Ser: 1.36 mg/dL — ABNORMAL HIGH (ref 0.61–1.24)
GFR, Estimated: 59 mL/min — ABNORMAL LOW (ref >60)
Glucose, Bld: 222 mg/dL — ABNORMAL HIGH (ref 70–99)
Potassium: 4 mmol/L (ref 3.5–5.1)
Sodium: 137 mmol/L (ref 135–145)

## 2022-07-08 LAB — PROTIME-INR
INR: 1.4 — ABNORMAL HIGH (ref 0.8–1.2)
Prothrombin Time: 16.6 seconds — ABNORMAL HIGH (ref 11.4–15.2)

## 2022-07-08 LAB — HEPARIN LEVEL (UNFRACTIONATED): Heparin Unfractionated: <0.1 IU/mL — ABNORMAL LOW (ref 0.30–0.70)

## 2022-07-08 LAB — LACTATE DEHYDROGENASE: LDH: 152 U/L (ref 98–192)

## 2022-07-08 SURGERY — DEBRIDEMENT, WOUND, STERNUM
Anesthesia: General | Site: Abdomen

## 2022-07-08 MED ORDER — LIDOCAINE 2% (20 MG/ML) 5 ML SYRINGE
INTRAMUSCULAR | Status: DC | PRN
  Administered 2022-07-08: 60 mg via INTRAVENOUS

## 2022-07-08 MED ORDER — ALBUMIN HUMAN 5 % IV SOLN: INTRAVENOUS | Status: DC | PRN

## 2022-07-08 MED ORDER — MIDAZOLAM HCL 2 MG/2ML IJ SOLN
INTRAMUSCULAR | Status: DC | PRN
  Administered 2022-07-08: 1 mg via INTRAVENOUS

## 2022-07-08 MED ORDER — OXYCODONE HCL 5 MG PO TABS: 5.0000 mg | ORAL_TABLET | Freq: Once | ORAL | Status: DC | PRN

## 2022-07-08 MED ORDER — HEPARIN (PORCINE) 25000 UT/250ML-% IV SOLN
500.0000 [IU]/h | INTRAVENOUS | Status: DC
Start: 2022-07-08 — End: 2022-07-18
  Administered 2022-07-08 – 2022-07-18 (×6): 500 [IU]/h via INTRAVENOUS
  Filled 2022-07-08 (×6): qty 250

## 2022-07-08 MED ORDER — VASOPRESSIN 20 UNIT/ML IV SOLN
INTRAVENOUS | Status: AC
  Filled 2022-07-08: qty 1

## 2022-07-08 MED ORDER — FENTANYL CITRATE (PF) 250 MCG/5ML IJ SOLN
INTRAMUSCULAR | Status: DC | PRN
  Administered 2022-07-08: 50 ug via INTRAVENOUS

## 2022-07-08 MED ORDER — CEFAZOLIN SODIUM-DEXTROSE 2-4 GM/100ML-% IV SOLN
INTRAVENOUS | Status: AC
  Filled 2022-07-08: qty 100

## 2022-07-08 MED ORDER — ACETAMINOPHEN 500 MG PO TABS: 1000.0000 mg | ORAL_TABLET | Freq: Once | ORAL | Status: DC | PRN

## 2022-07-08 MED ORDER — 0.9 % SODIUM CHLORIDE (POUR BTL) OPTIME
TOPICAL | Status: DC | PRN
  Administered 2022-07-08: 1000 mL

## 2022-07-08 MED ORDER — MIDAZOLAM HCL 2 MG/2ML IJ SOLN
INTRAMUSCULAR | Status: AC
  Filled 2022-07-08: qty 2

## 2022-07-08 MED ORDER — LACTATED RINGERS IV SOLN: INTRAVENOUS | Status: DC | PRN

## 2022-07-08 MED ORDER — FENTANYL CITRATE (PF) 100 MCG/2ML IJ SOLN
25.0000 ug | INTRAMUSCULAR | Status: DC | PRN
  Administered 2022-07-08: 50 ug via INTRAVENOUS
  Administered 2022-07-08 (×2): 25 ug via INTRAVENOUS

## 2022-07-08 MED ORDER — WARFARIN - PHARMACIST DOSING INPATIENT: Freq: Every day | Status: DC

## 2022-07-08 MED ORDER — DEXAMETHASONE SODIUM PHOSPHATE 10 MG/ML IJ SOLN
INTRAMUSCULAR | Status: DC | PRN
  Administered 2022-07-08: 5 mg via INTRAVENOUS

## 2022-07-08 MED ORDER — HEMOSTATIC AGENTS (NO CHARGE) OPTIME
TOPICAL | Status: DC | PRN
  Administered 2022-07-08: 1 via TOPICAL

## 2022-07-08 MED ORDER — OXYCODONE HCL 5 MG/5ML PO SOLN: 5.0000 mg | Freq: Once | ORAL | Status: DC | PRN

## 2022-07-08 MED ORDER — FENTANYL CITRATE (PF) 100 MCG/2ML IJ SOLN
INTRAMUSCULAR | Status: AC
  Filled 2022-07-08: qty 2

## 2022-07-08 MED ORDER — PHENYLEPHRINE HCL-NACL 20-0.9 MG/250ML-% IV SOLN
INTRAVENOUS | Status: DC | PRN
  Administered 2022-07-08: 40 ug/min via INTRAVENOUS

## 2022-07-08 MED ORDER — ROCURONIUM BROMIDE 10 MG/ML (PF) SYRINGE
PREFILLED_SYRINGE | INTRAVENOUS | Status: DC | PRN
  Administered 2022-07-08: 60 mg via INTRAVENOUS

## 2022-07-08 MED ORDER — FENTANYL CITRATE (PF) 250 MCG/5ML IJ SOLN
INTRAMUSCULAR | Status: AC
  Filled 2022-07-08: qty 5

## 2022-07-08 MED ORDER — ACETAMINOPHEN 10 MG/ML IV SOLN: 1000.0000 mg | Freq: Once | INTRAVENOUS | Status: DC | PRN

## 2022-07-08 MED ORDER — ONDANSETRON HCL 4 MG/2ML IJ SOLN
INTRAMUSCULAR | Status: DC | PRN
  Administered 2022-07-08: 4 mg via INTRAVENOUS

## 2022-07-08 MED ORDER — PHENYLEPHRINE 80 MCG/ML (10ML) SYRINGE FOR IV PUSH (FOR BLOOD PRESSURE SUPPORT)
PREFILLED_SYRINGE | INTRAVENOUS | Status: DC | PRN
  Administered 2022-07-08 (×3): 80 ug via INTRAVENOUS

## 2022-07-08 MED ORDER — MORPHINE SULFATE (PF) 4 MG/ML IV SOLN
4.0000 mg | INTRAVENOUS | Status: AC | PRN
  Administered 2022-07-08: 4 mg via INTRAVENOUS

## 2022-07-08 MED ORDER — PROPOFOL 10 MG/ML IV BOLUS
INTRAVENOUS | Status: AC
  Filled 2022-07-08: qty 20

## 2022-07-08 MED ORDER — SUGAMMADEX SODIUM 200 MG/2ML IV SOLN
INTRAVENOUS | Status: DC | PRN
  Administered 2022-07-08: 200 mg via INTRAVENOUS

## 2022-07-08 MED ORDER — WARFARIN SODIUM 5 MG PO TABS
5.0000 mg | ORAL_TABLET | Freq: Once | ORAL | Status: AC
  Administered 2022-07-08: 5 mg via ORAL
  Filled 2022-07-08: qty 1

## 2022-07-08 MED ORDER — PROPOFOL 10 MG/ML IV BOLUS
INTRAVENOUS | Status: DC | PRN
  Administered 2022-07-08: 50 mg via INTRAVENOUS
  Administered 2022-07-08: 60 mg via INTRAVENOUS

## 2022-07-08 MED ORDER — ACETAMINOPHEN 160 MG/5ML PO SOLN: 1000.0000 mg | Freq: Once | ORAL | Status: DC | PRN

## 2022-07-08 SURGICAL SUPPLY — 33 items
BENZOIN TINCTURE PRP APPL 2/3 (GAUZE/BANDAGES/DRESSINGS) ×2 IMPLANT
BLADE CLIPPER SURG (BLADE) ×2 IMPLANT
BLADE SURG 10 STRL SS (BLADE) ×2 IMPLANT
BLADE SURG 15 STRL LF DISP TIS (BLADE) IMPLANT
BLADE SURG 15 STRL SS (BLADE) ×1
CANISTER SUCT 3000ML PPV (MISCELLANEOUS) ×2 IMPLANT
CANISTER WOUND CARE 500ML ATS (WOUND CARE) ×2 IMPLANT
COVER SURGICAL LIGHT HANDLE (MISCELLANEOUS) ×4 IMPLANT
DRAPE INCISE IOBAN 66X45 STRL (DRAPES) ×1 IMPLANT
DRAPE LAPAROSCOPIC ABDOMINAL (DRAPES) ×2 IMPLANT
DRSG CUTIMED SORBACT 7X9 (GAUZE/BANDAGES/DRESSINGS) ×1 IMPLANT
DRSG VAC ATS SM SENSATRAC (GAUZE/BANDAGES/DRESSINGS) ×2 IMPLANT
DRSG VERSA FOAM LRG 10X15 (GAUZE/BANDAGES/DRESSINGS) ×1 IMPLANT
ELECT CAUTERY BLADE 6.4 (BLADE) ×1 IMPLANT
ELECT REM PT RETURN 9FT ADLT (ELECTROSURGICAL) ×2
ELECTRODE REM PT RTRN 9FT ADLT (ELECTROSURGICAL) ×1 IMPLANT
GLOVE BIO SURGEON STRL SZ7.5 (GLOVE) ×4 IMPLANT
GOWN STRL REUS W/ TWL LRG LVL3 (GOWN DISPOSABLE) ×4 IMPLANT
GOWN STRL REUS W/TWL LRG LVL3 (GOWN DISPOSABLE) ×4
GRAFT MYRIAD 3 LAYER 7X10 (Graft) ×1 IMPLANT
KIT BASIN OR (CUSTOM PROCEDURE TRAY) ×2 IMPLANT
KIT TURNOVER KIT B (KITS) ×2 IMPLANT
NS IRRIG 1000ML POUR BTL (IV SOLUTION) ×2 IMPLANT
PACK GENERAL/GYN (CUSTOM PROCEDURE TRAY) ×2 IMPLANT
PAD ARMBOARD 7.5X6 YLW CONV (MISCELLANEOUS) ×4 IMPLANT
POWDER MYRIAD MORCELLS 500MG (Miscellaneous) ×1 IMPLANT
POWDER SURGICEL 3.0 GRAM (HEMOSTASIS) ×1 IMPLANT
SOL PREP POV-IOD 4OZ 10% (MISCELLANEOUS) ×1 IMPLANT
SURGILUBE 2OZ TUBE FLIPTOP (MISCELLANEOUS) ×1 IMPLANT
SUT VIC AB 3-0 SH 8-18 (SUTURE) ×1 IMPLANT
TOWEL GREEN STERILE (TOWEL DISPOSABLE) ×2 IMPLANT
TOWEL GREEN STERILE FF (TOWEL DISPOSABLE) ×2 IMPLANT
WATER STERILE IRR 1000ML POUR (IV SOLUTION) ×2 IMPLANT

## 2022-07-08 NOTE — Plan of Care (Signed)
  Problem: Education: Goal: Knowledge of General Education information will improve Description: Including pain rating scale, medication(s)/side effects and non-pharmacologic comfort measures Outcome: Progressing   Problem: Health Behavior/Discharge Planning: Goal: Ability to manage health-related needs will improve Outcome: Progressing   Problem: Clinical Measurements: Goal: Ability to maintain clinical measurements within normal limits will improve Outcome: Progressing Goal: Will remain free from infection Outcome: Progressing Goal: Diagnostic test results will improve Outcome: Progressing Goal: Respiratory complications will improve Outcome: Progressing Goal: Cardiovascular complication will be avoided Outcome: Progressing   Problem: Activity: Goal: Risk for activity intolerance will decrease Outcome: Progressing   Problem: Nutrition: Goal: Adequate nutrition will be maintained Outcome: Progressing   Problem: Coping: Goal: Level of anxiety will decrease Outcome: Progressing   Problem: Elimination: Goal: Will not experience complications related to bowel motility Outcome: Progressing Goal: Will not experience complications related to urinary retention Outcome: Progressing   Problem: Pain Managment: Goal: General experience of comfort will improve Outcome: Progressing   Problem: Safety: Goal: Ability to remain free from injury will improve Outcome: Progressing   Problem: Skin Integrity: Goal: Risk for impaired skin integrity will decrease Outcome: Progressing   Problem: Education: Goal: Ability to describe self-care measures that may prevent or decrease complications (Diabetes Survival Skills Education) will improve Outcome: Progressing Goal: Individualized Educational Video(s) Outcome: Progressing   Problem: Fluid Volume: Goal: Ability to maintain a balanced intake and output will improve Outcome: Progressing   Problem: Health Behavior/Discharge  Planning: Goal: Ability to identify and utilize available resources and services will improve Outcome: Progressing Goal: Ability to manage health-related needs will improve Outcome: Progressing   Problem: Metabolic: Goal: Ability to maintain appropriate glucose levels will improve Outcome: Progressing

## 2022-07-08 NOTE — Progress Notes (Signed)
ANTICOAGULATION CONSULT NOTE  Pharmacy Consult for warfarin + heparin Indication:  LVAD  No Known Allergies  Patient Measurements: Height: 5\' 10"  (177.8 cm) Weight: 86.5 kg (190 lb 11.2 oz) (Scale B) IBW/kg (Calculated) : 73 Heparin Dosing Weight: 80kg  Vital Signs: Temp: 98.1 F (36.7 C) (07/31 0945) Temp Source: Oral (07/31 0333) BP: 125/101 (07/31 0945) Pulse Rate: 72 (07/31 1000)  Labs: Recent Labs    07/06/22 0545 07/07/22 0417 07/08/22 0046  HGB 9.5* 9.8* 9.8*  HCT 29.2* 29.6* 29.6*  PLT 174 176 189  LABPROT 22.5* 19.8* 16.6*  INR 2.0* 1.7* 1.4*  HEPARINUNFRC  --   --  <0.10*  CREATININE 1.29* 1.29* 1.36*     Estimated Creatinine Clearance: 58.9 mL/min (A) (by C-G formula based on SCr of 1.36 mg/dL (H)).   Medical History: Past Medical History:  Diagnosis Date   "    Arthritis    CAD (coronary artery disease)    a. s/p CABG in 11/2019 with LIMA-LAD, SVG-OM1, SVG-PDA and SVG-D1   CHF (congestive heart failure) (HCC)    a. EF < 20% by echo in 11/2019   Essential hypertension    PAF (paroxysmal atrial fibrillation) (HCC)    Type 2 diabetes mellitus (HCC)     Assessment: 26 yoM with HM3 LVAD admitted with ongoing LVAD driveline infection. Pt with planned debridements in OR. Pt is on warfarin PTA with last dose taken 7/3.   Pt s/p OR for infected wound debridement 7/7, 7/13,7/20, 7/31. Heparin to resume 4h postop this afternoon. Warfarin to resume, targeting lower end since pt will need repeat debridement in a week. INR 1.4, CBC and LDH stable.   *Home warfarin dose 6mg  MWF, 4mg  AODs  Goal of Therapy:  INR goal 2-2.5 - keep closer to 1.4-1.7 for OR next week Heparin level < 0.3 units/ml Monitor platelets by anticoagulation protocol: Yes   Plan:  Resume heparin no bolus 500 units/h at 1300 Warfarin 5mg  PO x1 tonight Daily INR, heparin level, CBC    8/31, PharmD, [place], [facility] Clinical Pharmacist [phone] Please check AMION for all [facility]  Pharmacy numbers 07/08/2022

## 2022-07-08 NOTE — Anesthesia Preprocedure Evaluation (Signed)
Anesthesia Evaluation  Patient identified by MRN, date of birth, ID band Patient awake    Reviewed: Allergy & Precautions, NPO status , Patient's Chart, lab work & pertinent test results  History of Anesthesia Complications Negative for: history of anesthetic complications  Airway Mallampati: II  TM Distance: >3 FB     Dental  (+) Edentulous Upper, Edentulous Lower   Pulmonary sleep apnea , COPD, former smoker,    breath sounds clear to auscultation       Cardiovascular hypertension, + angina + CAD and +CHF   Rhythm:Regular Rate:Normal  LVAD history noted    Neuro/Psych negative neurological ROS  negative psych ROS   GI/Hepatic negative GI ROS, Neg liver ROS,   Endo/Other  diabetesHyperthyroidism   Renal/GU Renal disease     Musculoskeletal   Abdominal   Peds  Hematology   Anesthesia Other Findings   Reproductive/Obstetrics                             Anesthesia Physical Anesthesia Plan  ASA: 4  Anesthesia Plan: General   Post-op Pain Management: Ofirmev IV (intra-op)*   Induction: Intravenous  PONV Risk Score and Plan: 2 and Ondansetron and Dexamethasone  Airway Management Planned: Oral ETT  Additional Equipment:   Intra-op Plan:   Post-operative Plan: Extubation in OR  Informed Consent: I have reviewed the patients History and Physical, chart, labs and discussed the procedure including the risks, benefits and alternatives for the proposed anesthesia with the patient or authorized representative who has indicated his/her understanding and acceptance.       Plan Discussed with: CRNA  Anesthesia Plan Comments:         Anesthesia Quick Evaluation

## 2022-07-08 NOTE — TOC CM/SW Note (Signed)
HF TOC CM reviewed notes from surgery, pt will go back to OR and is currently has wound vac. Will resubmit referral for [employer] wound vac closer to discharge. Pt has a $18 a day charge for wound vac. Pt is set up with [name] and [name] rep, Marylene Land to make aware of scheduled dc home tomorrow on IV abx. Will need resumption of HH RN orders and OPAT orders.  Isidoro Donning RN3 CCM, Heart Failure TOC CM [phone]

## 2022-07-08 NOTE — Progress Notes (Signed)
VAD Coordinator Procedure Note:   VAD Coordinator met patient in SS. Pt undergoing driveline debridement and wound vac change per Dr. Darcey Nora. Hemodynamics and VAD parameters monitored by myself and anesthesia throughout the procedure. Blood pressures were obtained with automatic cuff on right arm.    Time: Doppler Auto  BP Flow PI Power Speed  Pre-procedure:  0720  117/92(96) 3.9 4.3 4.1 5600                    Sedation Induction: 0752  141/109 (120) 3.7 5.9 4.1 5600   0800  76/53(62) 3.9 9.1 4 5600   0815  102/88(94) 4.3 3.4 4.2 5600   0830  96/83(89) 4.4 2.1 4.1 5600   0845  103/82(90) 4.4 3.0 4.0 5600           Recovery Area: 0910  124/101 (110) 3.9 4.2 4.1 5600   0922  129/93 (105) 4.1 3.9 4.1 5600    Patient tolerated the procedure well. VAD Coordinator accompanied and remained with patient in recovery area.    Patient Disposition: pt transported to [facility] and handoff given to [place], Therapist, sports.   [name] RN, BSN VAD Coordinator 24/7 Pager [phone]

## 2022-07-08 NOTE — Progress Notes (Signed)
Pharmacy Antibiotic Note  Kevin Underwood is a 61 y.o. male admitted on 06/12/2022 with LVAD Driveline infection s/p debridement  7/7 , 7/13 and 7/20.Noted patient was on Daptomycin with CK increased up to 1000, pt transitioned back to vancomycin. Scr remains stable, repeat debridement done this morning. Will check vancomycin levels after tonights dose.  Plan: -Continue Vancomycin 1000 mg IV every 24 hours  -Vancomycin peak 1h after dose complete, vancomycin trough tomorrow evening -Planning Vancomycin therapy through 07/18/22   Height: 5\' 10"  (177.8 cm) Weight: 86.5 kg (190 lb 11.2 oz) (Scale B) IBW/kg (Calculated) : 73  Temp (24hrs), Avg:98.1 F (36.7 C), Min:97.7 F (36.5 C), Max:98.9 F (37.2 C)  Recent Labs  Lab 07/01/22 2354 07/02/22 0037 07/02/22 2109 07/03/22 0355 07/04/22 0315 07/05/22 0456 07/06/22 0545 07/07/22 0417 07/08/22 0046  WBC  --   --   --    < > 7.7 8.4 7.5 7.0 8.1  CREATININE  --    < >  --    < > 1.36* 1.36* 1.29* 1.29* 1.36*  VANCOTROUGH  --   --  11*  --   --   --   --   --   --   VANCOPEAK 26*  --   --   --   --   --   --   --   --    < > = values in this interval not displayed.     Estimated Creatinine Clearance: 58.9 mL/min (A) (by C-G formula based on SCr of 1.36 mg/dL (H)).    No Known Allergies  07/10/22, PharmD, BCPS, [facility] Clinical Pharmacist [phone] Please check AMION for all [facility] numbers 07/08/2022

## 2022-07-08 NOTE — Progress Notes (Signed)
Pt in OR during shift changed.

## 2022-07-08 NOTE — Brief Op Note (Signed)
07/08/2022  9:06 AM  PATIENT:  Kevin Underwood  61 y.o. male  PRE-OPERATIVE DIAGNOSIS:  ABDOMINAL WOUND  POST-OPERATIVE DIAGNOSIS:  ABDOMINAL WOUND  PROCEDURE:  Procedure(s): ABDOMINAL WOUND DEBRIDEMENT (N/A) WOUND VAC CHANGE (N/A) Application of tissue matrix Myriad  SURGEON:  Surgeon(s) and Role:    [name], MD - Primary  PHYSICIAN ASSISTANT:   ASSISTANTS: RNFA   ANESTHESIA:   general  EBL:  minimal  BLOOD ADMINISTERED:none  DRAINS: none   LOCAL MEDICATIONS USED:  NONE  SPECIMEN:  NA  DISPOSITION OF SPECIMEN:  N/A  COUNTS:  YES  TOURNIQUET:  * No tourniquets in log *  DICTATION: .Dragon Dictation  PLAN OF CARE:  return to 2C  PATIENT DISPOSITION:  PACU - hemodynamically stable.   Delay start of Pharmacological VTE agent (>24hrs) due to surgical blood loss or risk of bleeding: yes Resume iv heparin 4 hours postop

## 2022-07-08 NOTE — Progress Notes (Signed)
Patient LVAD controller beeping, control monitor reading "Backup Battery Fault". LVAD coordinator Sarah paged and made aware.  LVAD coordinator coming first thing in the morning to change battery.

## 2022-07-08 NOTE — Op Note (Unsigned)
NAME: Kevin Underwood, Kevin Underwood MEDICAL RECORD NO: [id number] ACCOUNT NO: [id number] DATE OF BIRTH: September 29, 1961 FACILITY: [facility] LOCATION: [facility] PHYSICIAN: Ivin Poot III, MD  Operative Report   DATE OF PROCEDURE: 07/08/2022  OPERATION:   1. Excisional Debridement of abdominal wound, VAD tunnel site. 2.  Application of Myriad tissue matrix. 3.  Wound VAC change.  SURGEON:  Tharon Aquas Trigt III, MD  ANESTHESIA:  General.  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  CLINICAL FINDINGS:  The wound has started to granulate nicely with incorporation of the previously placed tissue matrix for up to 80% of the wound.  The most medial 20% needed some excisional debridement and removal of the lower coating of the power  cord.  DESCRIPTION OF PROCEDURE:  The patient was brought to preoperative holding area where informed consent was documented and the procedure and plans were reviewed again with the patient who understood and agreed.  The patient was brought back to the  operating room by the OR team as well as the VAD coordinator who was with the patient in the preoperative area as well as intraoperatively and in the postoperative recovery room for operation of the VAD equipment and to assist with hemodynamic monitoring  and general care.  The patient was placed supine on the operating table and general anesthesia was induced.  Blood pressure and hemodynamics remained stable.  The previously placed wound VAC sponge was removed.  The area around the wound was prepped and draped as a sterile  field.  A proper timeout was performed.  The wound was inspected.  The Sorbact membrane was removed.  Underneath the Myriad tissue matrix had been then started to incorporate nicely in 80% of the wound from medial to lateral.   The most medial aspect of  the wound still had some tunneling and this indurated tissue was sharply excised.  There was no purulence at this debridement.  Once the space was removed, the wound was irrigated and hemostasis was achieved.  The wound measures 12 cm long x 4 cm wide x  2 cm deep medially and 0.5 cm deep laterally.  Myriad powder Morcells was applied to the freshly debrided medial aspect.  The remaining wound was covered with the Myriad Matrix, which was secured over a Sorbact with interrupted 3-0 Vicryl sutures.  Over  the Sorbact, tissue lubricant was applied and then the wound VAC sponge, black extending from the lateral aspect to 80% of the wound and then a white sponge placed medially to cover approximately 20% of the wound.  Over this, the wound VAC dressings were  applied.  The wound VAC suction tubing was applied and the system was activated with good compression of the sponges and no air leak.  The patient was then reversed from anesthesia and returned to recovery room in stable condition.       PAA D: 07/08/2022 9:18:46 am T: 07/08/2022 10:01:00 am  JOB: [id number]/ [id number]

## 2022-07-08 NOTE — Progress Notes (Signed)
LVAD Coordinator Rounding Note:  Admitted 06/12/22 to Dr. Claris Gladden service due to drive line debridement scheduled with Dr Prescott Gum 06/14/22.   S/p driveline site debridement and wound vac on 7/7.  7/10 Had Ramp ECHO. Speed optimized. 7/13 OR to [facility] dressing change.  Back on Heparin drip.  7/20 OR for debridement and VAC dressing change  Met pt in short stay this morning. pt denies complaints other than chronic back pain. Received page last night that pts controller was alarming "backup battery fault." Nurse was instructed to silence alarm. This was corrected this morning in short stay. Backup battery replaced this morning with [id number] in 30 mths.  Wound vac in place alarming blockage. Pt states that wound vac started alarming blockage yesterday. Suction set at -50.   Transitioned from daptomycin to vancomycin due to elevated CK. Vancomycin planned x 6 weeks; end date 07/18/22.   Will need to reschedule appt w/[facility] at discharge.   Vital signs: Temp:  97.9 HR: 76 Doppler Pressure: not documented Automatic BP: 117/92 (96) O2 Sat: 97% on RA Wt: 171.7>174.6>178.6>182.9>181>...184.3>186.7>185.6>188.7>187.8>189.1>189.1>190.9>192>190.7lbs   LVAD interrogation reveals:  Speed: 5600 Flow:  4 Power:  4.2 w PI: 4.4 Alarms: backup battery fault-replaced back up battery in short stay Events: 70+ yesterday Hct: 29  Fixed speed:  5600 Low speed limit:  5300  Drive Line:  Wound vac in place. Suction at -50; no leaks noted; draining thin brown cloudy fluid. Anchor intact to drive line.    Labs:  LDH trend: 249>244>208>22>229>199>210>189>163>160>147>157>147>145>169>149>122>143>152  INR trend: 1.3 >1.3>1.2>1.1>1.1>1.1>1.1>1.2>1.3>1.5>1.6>1.7>1.7>1.5>1.7>1.8>1.9>1.4  Anticoagulation Plan: -INR Goal: 2.0 - 2.5 -ASA Dose: none  ICD: N/A  Drips:  Heparin - 500 units/hr-off  Infection:  06/07/22>> repeat drive line culture>> abundant MRSA; final  Blood  Products: - 06/21/22>> 2 PRBC   Plan/Recommendations:  Page VAD coordinators for equipment or drive line issues    [name] RN [place] Coordinator  Office: [phone]  24/7 Pager: [phone]

## 2022-07-08 NOTE — Progress Notes (Signed)
Pre Procedure note for inpatients:   Kevin Underwood has been scheduled for Procedure(s): ABDOMINAL WOUND DEBRIDEMENT (N/A) WOUND VAC CHANGE (N/A) today. The various methods of treatment have been discussed with the patient. After consideration of the risks, benefits and treatment options the patient has consented to the planned procedure.   The patient has been seen and labs reviewed. There are no changes in the patient's condition to prevent proceeding with the planned procedure today.  Recent labs:  Lab Results  Component Value Date   WBC 8.1 07/08/2022   HGB 9.8 (L) 07/08/2022   HCT 29.6 (L) 07/08/2022   PLT 189 07/08/2022   GLUCOSE 222 (H) 07/08/2022   CHOL 142 09/11/2021   TRIG 95 09/11/2021   HDL 62 09/11/2021   LDLCALC 61 09/11/2021   ALT 86 (H) 06/12/2022   AST 68 (H) 06/12/2022   NA 137 07/08/2022   K 4.0 07/08/2022   CL 104 07/08/2022   CREATININE 1.36 (H) 07/08/2022   BUN 32 (H) 07/08/2022   CO2 25 07/08/2022   TSH 7.790 (H) 05/14/2022   INR 1.4 (H) 07/08/2022   HGBA1C 8.2 (H) 05/13/2022    Lovett Sox, MD 07/08/2022 7:17 AM

## 2022-07-08 NOTE — Progress Notes (Addendum)
Patient ID: Kevin Underwood, male   DOB: Jun 04, 1961, 61 y.o.   MRN: [id number]   Advanced Heart Failure VAD Team Note  PCP-Cardiologist: Nona Dell, MD   Subjective:    S/p driveline site debridement and wound vac on 7/7.  7/10 Had Ramp ECHO. Speed optimized. 7/13 OR to VAC change.  Back on Heparin drip.  7/20 OR for debridement and VAC change  Seen in room after OR for debridement and VAC change this am. Going back to OR next week.  Scr has been stable between 1.3-1.4  MAP 100s this am (did not receive am BP meds until post-op)  INR 1.4. Restarting heparin this afternoon.  Has been up walking halls every day. No dyspnea. Only complaint is abdominal pain at wound site.  LVAD INTERROGATION:  HeartMate III LVAD:   Flow 4.1 liters/min, speed 5600, power 4 PI 3.9. VAD interrogated personally. Parameters stable.  Objective:    Vital Signs:   Temp:  [97.7 F (36.5 C)-98.9 F (37.2 C)] 98.1 F (36.7 C) (07/31 0945) Pulse Rate:  [67-90] 72 (07/31 1000) Resp:  [11-20] 15 (07/31 0945) BP: (112-135)/(80-101) 125/101 (07/31 0945) SpO2:  [95 %-100 %] 98 % (07/31 1000) Last BM Date : 07/06/22 Mean arterial Pressure  MAP 100s Intake/Output:   Intake/Output Summary (Last 24 hours) at 07/08/2022 1127 Last data filed at 07/08/2022 0906 Gross per 24 hour  Intake 1474.4 ml  Output 25 ml  Net 1449.4 ml     Physical Exam   Physical Exam: GENERAL: Lying in bed. HEENT: normal  NECK: Supple, no JVD.  2+ bilaterally, no bruits.  No lymphadenopathy or thyromegaly appreciated.   CARDIAC:  Mechanical heart sounds with LVAD hum present.  LUNGS:  Clear to auscultation bilaterally.  ABDOMEN:  Soft, round, nontender, positive bowel sounds x4.     LVAD exit site: Wound VAC present. Dressing dry and intact EXTREMITIES:  Warm and dry, no cyanosis, clubbing, rash or edema  NEUROLOGIC:  Alert and oriented x 4.  Gait steady.  No aphasia.  No dysarthria.  Affect pleasant.      Telemetry    SR 60s-70s  Labs   Basic Metabolic Panel: Recent Labs  Lab 07/04/22 0315 07/05/22 0456 07/06/22 0545 07/07/22 0417 07/08/22 0046  NA 137 136 138 135 137  K 4.1 4.0 4.1 3.9 4.0  CL 105 103 104 104 104  CO2 27 26 28 25 25   GLUCOSE 135* 99 149* 103* 222*  BUN 33* 32* 31* 33* 32*  CREATININE 1.36* 1.36* 1.29* 1.29* 1.36*  CALCIUM 8.9 9.1 9.0 9.0 8.9    Liver Function Tests: No results for input(s): "AST", "ALT", "ALKPHOS", "BILITOT", "PROT", "ALBUMIN" in the last 168 hours.  No results for input(s): "LIPASE", "AMYLASE" in the last 168 hours. No results for input(s): "AMMONIA" in the last 168 hours.  CBC: Recent Labs  Lab 07/04/22 0315 07/05/22 0456 07/06/22 0545 07/07/22 0417 07/08/22 0046  WBC 7.7 8.4 7.5 7.0 8.1  HGB 9.5* 10.1* 9.5* 9.8* 9.8*  HCT 29.1* 31.6* 29.2* 29.6* 29.6*  MCV 87.1 88.0 86.9 86.8 85.5  PLT 188 187 174 176 189    INR: Recent Labs  Lab 07/04/22 0315 07/05/22 0456 07/06/22 0545 07/07/22 0417 07/08/22 0046  INR 1.9* 1.9* 2.0* 1.7* 1.4*    Other results:   Imaging   No results found.   Medications:     Scheduled Medications:  allopurinol  100 mg Oral Daily   Chlorhexidine Gluconate Cloth  6 each  Topical Daily   docusate sodium  200 mg Oral BID   gabapentin  600 mg Oral BID   guaiFENesin  600 mg Oral BID   hydrALAZINE  25 mg Oral TID   insulin aspart  0-15 Units Subcutaneous TID WC   insulin aspart  0-5 Units Subcutaneous QHS   insulin aspart  3 Units Subcutaneous TID WC   insulin detemir  15 Units Subcutaneous QHS   lactose free nutrition  237 mL Oral TID WC   loratadine  10 mg Oral Daily   methimazole  20 mg Oral BID   metoprolol succinate  25 mg Oral Daily   pantoprazole  40 mg Oral Daily   polyethylene glycol  17 g Oral Daily   rosuvastatin  20 mg Oral Daily   sertraline  50 mg Oral Daily   tamsulosin  0.4 mg Oral Daily   traZODone  150 mg Oral QHS    Infusions:  heparin     vancomycin 1,000 mg (07/07/22  2230)    PRN Medications: acetaminophen, albuterol, morphine injection, morphine injection, ondansetron (ZOFRAN) IV, mouth rinse, [DISCONTINUED] oxyCODONE **OR** oxyCODONE, sorbitol, traMADol   Patient Profile   61 y.o. male with history of HM III LVAD, chronic systolic CHF/ICM, COPD, PAF. 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   Readmitted for driveline infection and surgical debridement/possible wound vac placement.  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 S/p driveline site debridement 6/8. Surgical wound cultures grew MRSA, repeat debridement 6/23. Driveline debridement with wound vac placement on 7/7. 7/13, 7/20 VAC dressing change. Went back to OR this am for debridement and VAC change - Transitioned from daptomycin to vancomycin due to elevated CK.  Vancomycin planned x 6 wks, end date 07/18/22.  - INR 1.4 today. Went to OR this am, restarting heparin this am (heparin for INR < 1.8). Keep INR 1.4-1.7 range as he will go back to OR next week (08/07).   2.  Chronic systolic CHF: Echo 10/21 with EF 20-25%, mildly decreased RV function. LHC/RHC in 12/21 with patent grafts, low output. Suspect mixed ischemic/nonischemic cardiomyopathy (prior heavy ETOH and drugs as well as CAD).  No ETOH, drugs, smoking since CABG in 12/20. Admitted with cardiogenic shock in 12/21, had placement of Impella 5.5 initially, now s/p Heartmate 3 LVAD on 11/17/20.  Ramp echo 2/23 with speed decreased to 5500 rpm. He would eventually like heart transplant. NYHA class II. Have been allowing MAP to run higher due to recent AKI thought to be due to lowering BP too much in setting of right renal artery stenosis. Had Ramp ECHO with speed optimized. Volume status stable. MAP higher in 90s this am but did not receive BP meds until after OR. Will need to monitor. - INR 1.4. Restarting heparin this afternoon.  - Continue Toprol XL 25 mg daily.  -  Continue hydralazine 25 mg tid.  - PICC placed for long-term antibiotic use 3. AKI: Creatinine up to 4.6 last admission, initially thought this was ATN from low BP but he was never markedly hypotensive and LFTs without any evidence of shock liver. ? Vanc toxicity (troughs were ok). Taken off Entresto and hydralazine. Volume repleted with IVF. In reviewing prior CTA abdomen/pelvis, he has high-grade stenosis of proximal right renal artery. D/w Renal and feel like he may need higher perfusion pressures. Scr now stable, 1.3-1.4 this admit today. MAP in 100s as above.  - Would consider reassessment of  renal artery stenosis and possible treatment once creatinine has improved to baseline.  4.  CAD: S/p CABG 12/20.  LHC pre-VAD with patent grafts, no target for intervention. - Continue statin.  5.  Atrial fibrillation: Paroxysmal.  S/p Maze and LA appendage clip with CABG in 12/20. In NSR today  - Continue Toprol XL 25 mg daily.  - Off amiodarone with hyperthyroidism. 6.  Type 2 diabetes: Takes glipizide at home.  - SSI  - Continue Levemir 15 units qHS - Diabetes coordinator following 7. 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  8. 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  He missed his endocrinology appt. Had been rescheduled for July 27th, will need to reschedule again. - He is now off amiodarone.   - Continue methimazole 20 mg bid, TSH was mildly elevated.   9.  Gout: On allopurinol 10. Anemia (chronic blood loss): Got 1u RBCs on 7/12. Hgb 9.8 today    I reviewed the LVAD parameters from today, and compared the results to the patient's prior recorded data.  No programming changes were made.  The LVAD is functioning within specified parameters.  The patient performs LVAD self-test daily.  LVAD interrogation was negative for any significant power changes, alarms or PI events/speed drops.  LVAD equipment check completed and is in good working order.  Back-up  equipment present.   LVAD education done on emergency procedures and precautions and reviewed exit site care.  Length of Stay: [place], LINDSAY N. 07/08/2022 11:27 AM  Agree with the above PA note.   Debridement/Vac change in OR today.  MAP elevated post-op.   General: Well appearing this am. NAD.  HEENT: Normal. Neck: Supple, JVP 7-8 cm. Carotids OK.  Cardiac:  Mechanical heart sounds with LVAD hum present.  Lungs:  CTAB, normal effort.  Abdomen:  NT, ND, no HSM. No bruits or masses. +BS  LVAD exit site: Wound vac Extremities:  Warm and dry. No cyanosis, clubbing, rash, or edema.  Neuro:  Alert & oriented x 3. Cranial nerves grossly intact. Moves all 4 extremities w/o difficulty. Affect pleasant    He will need to get his hydralazine today for elevated MAP.   He will need to return to OR next week for repeat debridement.  Restart heparin gtt post-op.   Loralie Champagne 07/08/2022

## 2022-07-08 NOTE — Progress Notes (Signed)
Back from PACU by bed awake and alert. Denied any discomfort. 

## 2022-07-08 NOTE — Anesthesia Procedure Notes (Signed)
Procedure Name: Intubation Date/Time: 07/08/2022 7:57 AM  Performed by: Lanell Matar, CRNAPre-anesthesia Checklist: Patient identified, Emergency Drugs available, Suction available and Patient being monitored Patient Re-evaluated:Patient Re-evaluated prior to induction Oxygen Delivery Method: Circle System Utilized Preoxygenation: Pre-oxygenation with 100% oxygen Induction Type: IV induction Ventilation: Mask ventilation without difficulty and Oral airway inserted - appropriate to patient size Laryngoscope Size: Hyacinth Meeker and 2 Grade View: Grade I Tube type: Oral Tube size: 7.5 mm Number of attempts: 1 Airway Equipment and Method: Stylet and Oral airway Placement Confirmation: ETT inserted through vocal cords under direct vision, positive ETCO2 and breath sounds checked- equal and bilateral Secured at: 22 cm Tube secured with: Tape Dental Injury: Teeth and Oropharynx as per pre-operative assessment

## 2022-07-08 NOTE — Transfer of Care (Signed)
Immediate Anesthesia Transfer of Care Note  Patient: Kevin Underwood  Procedure(s) Performed: ABDOMINAL WOUND DEBRIDEMENT (Abdomen) WOUND VAC CHANGE (Abdomen)  Patient Location: PACU  Anesthesia Type:General  Level of Consciousness: awake, drowsy and patient cooperative  Airway & Oxygen Therapy: Patient Spontanous Breathing and Patient connected to nasal cannula oxygen  Post-op Assessment: Report given to RN, Post -op Vital signs reviewed and stable and Patient moving all extremities X 4  Post vital signs: Reviewed and stable  Last Vitals:  Vitals Value Taken Time  BP 124/101 07/08/22 0904  Temp    Pulse 68 07/08/22 0907  Resp 8 07/08/22 0907  SpO2 100 % 07/08/22 0907  Vitals shown include unvalidated device data.  Last Pain:  Vitals:   07/08/22 0333  TempSrc: Oral  PainSc:       Patients Stated Pain Goal: 0 (07/07/22 2220)  Complications: No notable events documented.

## 2022-07-09 ENCOUNTER — Encounter (HOSPITAL_COMMUNITY): Payer: Self-pay | Admitting: Cardiothoracic Surgery

## 2022-07-09 DIAGNOSIS — T827XXA Infection and inflammatory reaction due to other cardiac and vascular devices, implants and grafts, initial encounter: Secondary | ICD-10-CM | POA: Diagnosis not present

## 2022-07-09 LAB — HEPARIN LEVEL (UNFRACTIONATED): Heparin Unfractionated: <0.1 IU/mL — ABNORMAL LOW (ref 0.30–0.70)

## 2022-07-09 LAB — BASIC METABOLIC PANEL
Anion gap: 7 (ref 5–15)
BUN: 28 mg/dL — ABNORMAL HIGH (ref 8–23)
CO2: 26 mmol/L (ref 22–32)
Calcium: 8.6 mg/dL — ABNORMAL LOW (ref 8.9–10.3)
Chloride: 102 mmol/L (ref 98–111)
Creatinine, Ser: 1.2 mg/dL (ref 0.61–1.24)
GFR, Estimated: >60 mL/min (ref >60)
Glucose, Bld: 186 mg/dL — ABNORMAL HIGH (ref 70–99)
Potassium: 3.5 mmol/L (ref 3.5–5.1)
Sodium: 135 mmol/L (ref 135–145)

## 2022-07-09 LAB — VANCOMYCIN, PEAK: Vancomycin Pk: 25 ug/mL — ABNORMAL LOW (ref 30–40)

## 2022-07-09 LAB — GLUCOSE, CAPILLARY
Glucose-Capillary: 164 mg/dL — ABNORMAL HIGH (ref 70–99)
Glucose-Capillary: 89 mg/dL (ref 70–99)
Glucose-Capillary: 224 mg/dL — ABNORMAL HIGH (ref 70–99)
Glucose-Capillary: 181 mg/dL — ABNORMAL HIGH (ref 70–99)

## 2022-07-09 LAB — CBC
HCT: 26.2 % — ABNORMAL LOW (ref 39.0–52.0)
Hemoglobin: 8.6 g/dL — ABNORMAL LOW (ref 13.0–17.0)
MCH: 28.5 pg (ref 26.0–34.0)
MCHC: 32.8 g/dL (ref 30.0–36.0)
MCV: 86.8 fL (ref 80.0–100.0)
Platelets: 166 10*3/uL (ref 150–400)
RBC: 3.02 MIL/uL — ABNORMAL LOW (ref 4.22–5.81)
RDW: 18.3 % — ABNORMAL HIGH (ref 11.5–15.5)
WBC: 7.7 10*3/uL (ref 4.0–10.5)
nRBC: 0 % (ref 0.0–0.2)

## 2022-07-09 LAB — LACTATE DEHYDROGENASE: LDH: 119 U/L (ref 98–192)

## 2022-07-09 LAB — VANCOMYCIN, TROUGH: Vancomycin Tr: 9 ug/mL — ABNORMAL LOW (ref 15–20)

## 2022-07-09 LAB — PROTIME-INR
INR: 1.2 (ref 0.8–1.2)
Prothrombin Time: 15.4 seconds — ABNORMAL HIGH (ref 11.4–15.2)

## 2022-07-09 MED ORDER — VANCOMYCIN HCL 1250 MG/250ML IV SOLN
1250.0000 mg | INTRAVENOUS | Status: AC
  Administered 2022-07-09 – 2022-07-18 (×10): 1250 mg via INTRAVENOUS
  Filled 2022-07-09 (×10): qty 250

## 2022-07-09 MED ORDER — WARFARIN SODIUM 5 MG PO TABS
5.0000 mg | ORAL_TABLET | Freq: Every day | ORAL | Status: DC
  Administered 2022-07-09 – 2022-07-11 (×3): 5 mg via ORAL
  Filled 2022-07-09 (×3): qty 1

## 2022-07-09 NOTE — Progress Notes (Signed)
Pharmacy Antibiotic Note  Kevin Underwood is a 61 y.o. male admitted on 06/12/2022 with LVAD Driveline infection s/p debridement  7/7 , 7/13 and 7/20.Noted patient was on Daptomycin with CK increased up to 1000, pt transitioned back to vancomycin.   Vancomycin levels 7/31 and 8/1 demonstrate an AUC slightly below goal at 387 mcg*h/ml.   Plan: -Increase vancomycin to 1250mg  IV q24h -Planning Vancomycin therapy through 07/18/22   Height: 5\' 10"  (177.8 cm) Weight: 88.6 kg (195 lb 5.2 oz) IBW/kg (Calculated) : 73  Temp (24hrs), Avg:98.1 F (36.7 C), Min:97.7 F (36.5 C), Max:98.3 F (36.8 C)  Recent Labs  Lab 07/02/22 2109 07/03/22 0355 07/05/22 0456 07/06/22 0545 07/07/22 0417 07/08/22 0046 07/08/22 2355 07/09/22 0500 07/09/22 2023  WBC  --    < > 8.4 7.5 7.0 8.1  --  7.7  --   CREATININE  --    < > 1.36* 1.29* 1.29* 1.36*  --  1.20  --   VANCOTROUGH 11*  --   --   --   --   --   --   --  9*  VANCOPEAK  --   --   --   --   --   --  25*  --   --    < > = values in this interval not displayed.     Estimated Creatinine Clearance: 72.4 mL/min (by C-G formula based on SCr of 1.2 mg/dL).    No Known Allergies  09/08/22, PharmD, BCPS, [facility] Clinical Pharmacist [phone] Please check AMION for all [facility] numbers 07/09/2022

## 2022-07-09 NOTE — Progress Notes (Signed)
LVAD Coordinator Rounding Note:  Admitted 06/12/22 to Dr. Alford Highland service due to drive line debridement scheduled with Dr Donata Clay 06/14/22.   S/p driveline site debridement and wound vac on 7/7.  7/10 Had Ramp ECHO. Speed optimized. 7/13 OR to [facility] dressing change.  Back on Heparin drip.  7/20 OR for debridement and VAC dressing change  Pt up in the chair this morning. States that he feels good. Denies pain. Driveline not in the anchor this morning - placed in anchor.  Wound vac in place. No alarms. Suction at -125.  Transitioned from daptomycin to vancomycin due to elevated CK. Vancomycin planned x 6 weeks; end date 07/18/22.   Will need to reschedule appt w/[facility] at discharge.   Vital signs: Temp:  98.1 HR: 72 Doppler Pressure: 110 Automatic BP: 105/86 (94) O2 Sat: 98% on RA Wt: 171.7>174.6>178.6>182.9>181>...184.3>186.7>185.6>188.7>187.8>189.1>189.1>190.9>192>190.7>195.3lbs   LVAD interrogation reveals:  Speed: 5600 Flow:  4.2 Power:  4.2 w PI: 3.2 Alarms: on batteries Events: on batteries Hct: 29  Fixed speed:  5600 Low speed limit:  5300  Drive Line:  Wound vac in place. Suction at -125; no leaks noted; draining thin brown cloudy fluid. Anchor secure in drive line.    Labs:  LDH trend: 249>244>208>22>229>199>210>189>163>160>147>157>147>145>169>149>122>143>152>119  INR trend: 1.3 >1.3>1.2>1.1>1.1>1.1>1.1>1.2>1.3>1.5>1.6>1.7>1.7>1.5>1.7>1.8>1.9>1.4>1.2  Anticoagulation Plan: -INR Goal: 2.0 - 2.5 -ASA Dose: none  ICD: N/A  Drips:  Heparin - 500 units/hr  Infection:  06/07/22>> repeat drive line culture>> abundant MRSA; final  Blood Products: - 06/21/22>> 2 PRBC   Plan/Recommendations:  Page VAD coordinators for equipment or drive line issues    Carlton Adam RN VAD Coordinator  Office: [phone]  24/7 Pager: [phone]

## 2022-07-09 NOTE — Progress Notes (Signed)
ANTICOAGULATION CONSULT NOTE  Pharmacy Consult for warfarin + heparin Indication:  LVAD  No Known Allergies  Patient Measurements: Height: 5\' 10"  (177.8 cm) Weight: 88.6 kg (195 lb 5.2 oz) IBW/kg (Calculated) : 73 Heparin Dosing Weight: 80kg  Vital Signs: Temp: 98.1 F (36.7 C) (08/01 1125) Temp Source: Oral (08/01 1125) BP: 105/86 (08/01 1125) Pulse Rate: 70 (08/01 1125)  Labs: Recent Labs    07/07/22 0417 07/08/22 0046 07/09/22 0500  HGB 9.8* 9.8* 8.6*  HCT 29.6* 29.6* 26.2*  PLT 176 189 166  LABPROT 19.8* 16.6* 15.4*  INR 1.7* 1.4* 1.2  HEPARINUNFRC  --  <0.10* <0.10*  CREATININE 1.29* 1.36* 1.20     Estimated Creatinine Clearance: 72.4 mL/min (by C-G formula based on SCr of 1.2 mg/dL).   Medical History: Past Medical History:  Diagnosis Date   "    Arthritis    CAD (coronary artery disease)    a. s/p CABG in 11/2019 with LIMA-LAD, SVG-OM1, SVG-PDA and SVG-D1   CHF (congestive heart failure) (HCC)    a. EF < 20% by echo in 11/2019   Essential hypertension    PAF (paroxysmal atrial fibrillation) (HCC)    Type 2 diabetes mellitus (HCC)     Assessment: 32 yoM with HM3 LVAD admitted with ongoing LVAD driveline infection. Pt with planned debridements in OR. Pt is on warfarin PTA with last dose taken 7/3.   Pt s/p OR for infected wound debridement 7/7, 7/13,7/20, 7/31, next planned 8/7. Heparin resumed postop at fixed low rate 5000 uts/hr HL remains < 0.1. Warfarin resumed post op, targeting lower end since pt will need repeat debridement in a week. INR 1.2, CBC and LDH stable.   *Home warfarin dose 6mg  MWF, 4mg  AODs  Goal of Therapy:  INR goal 2-2.5 - keep closer to 1.4-1.7 for OR next week Heparin level < 0.3 units/ml Monitor platelets by anticoagulation protocol: Yes   Plan:  Continue heparin 500 units/h  Warfarin 5mg  PO daily  Daily INR, heparin level, CBC    10/7 Pharm.D. CPP, BCPS Clinical Pharmacist [phone] 07/09/2022 2:57  PM   Please check AMION for all [facility] Pharmacy numbers 07/09/2022

## 2022-07-09 NOTE — Progress Notes (Addendum)
Patient ID: Kevin Underwood, male   DOB: 08/12/1961, 61 y.o.   MRN: [id number]   Advanced Heart Failure VAD Team Note  PCP-Cardiologist: Rozann Lesches, MD   Subjective:    S/p driveline site debridement and wound vac on 7/7.  7/10 Had Ramp ECHO. Speed optimized. 7/13 OR to VAC change.  Back on Heparin drip.  7/20 OR for debridement and VAC change 7/31 OR for debriedment and VAC change  Plan for OR again next week.   Scr has been stable between 1.2-1.4  MAP 100s this am   INR 1.2. Heparin gtt restarted yesterday post-op  Up and ready to walk. Intermittent pain at site. Denies CP and SOB.   LVAD INTERROGATION:  HeartMate III LVAD:   Flow 4.3 liters/min, speed 5600, power 4.2 PI 3.3. VAD interrogated personally. Parameters stable.  Objective:    Vital Signs:   Temp:  [97.7 F (36.5 C)-98.5 F (36.9 C)] 98 F (36.7 C) (08/01 0311) Pulse Rate:  [67-86] 77 (08/01 0311) Resp:  [11-27] 27 (08/01 0537) BP: (109-135)/(90-101) 124/93 (08/01 0311) SpO2:  [95 %-100 %] 98 % (08/01 0311) Weight:  [88.6 kg] 88.6 kg (08/01 0537) Last BM Date : 07/06/22 Mean arterial Pressure  MAP 100s Intake/Output:   Intake/Output Summary (Last 24 hours) at 07/09/2022 0701 Last data filed at 07/09/2022 0557 Gross per 24 hour  Intake 1815.1 ml  Output 50 ml  Net 1765.1 ml     Physical Exam   Physical Exam: General:  well appearing.No respiratory difficulty HEENT: normal Neck: supple. JVD flat. Carotids 2+ bilat; no bruits. No lymphadenopathy or thyromegaly appreciated. Cor: Mechanical heart sounds with LVAD hum present.  Lungs: clear Abdomen: soft, nontender, nondistended. No hepatosplenomegaly. No bruits or masses. Good bowel sounds. Driveline site with wound vac in place.  Extremities: no cyanosis, clubbing, rash, edema  Neuro: alert & oriented x 3, cranial nerves grossly intact. moves all 4 extremities w/o difficulty. Affect pleasant.   Telemetry   NSR 70's (Personally reviewed)     Labs   Basic Metabolic Panel: Recent Labs  Lab 07/05/22 0456 07/06/22 0545 07/07/22 0417 07/08/22 0046 07/09/22 0500  NA 136 138 135 137 135  K 4.0 4.1 3.9 4.0 3.5  CL 103 104 104 104 102  CO2 26 28 25 25 26   GLUCOSE 99 149* 103* 222* 186*  BUN 32* 31* 33* 32* 28*  CREATININE 1.36* 1.29* 1.29* 1.36* 1.20  CALCIUM 9.1 9.0 9.0 8.9 8.6*    Liver Function Tests: No results for input(s): "AST", "ALT", "ALKPHOS", "BILITOT", "PROT", "ALBUMIN" in the last 168 hours.  No results for input(s): "LIPASE", "AMYLASE" in the last 168 hours. No results for input(s): "AMMONIA" in the last 168 hours.  CBC: Recent Labs  Lab 07/05/22 0456 07/06/22 0545 07/07/22 0417 07/08/22 0046 07/09/22 0500  WBC 8.4 7.5 7.0 8.1 7.7  HGB 10.1* 9.5* 9.8* 9.8* 8.6*  HCT 31.6* 29.2* 29.6* 29.6* 26.2*  MCV 88.0 86.9 86.8 85.5 86.8  PLT 187 174 176 189 166    INR: Recent Labs  Lab 07/05/22 0456 07/06/22 0545 07/07/22 0417 07/08/22 0046 07/09/22 0500  INR 1.9* 2.0* 1.7* 1.4* 1.2    Other results:   Imaging   No results found.   Medications:     Scheduled Medications:  allopurinol  100 mg Oral Daily   Chlorhexidine Gluconate Cloth  6 each Topical Daily   docusate sodium  200 mg Oral BID   gabapentin  600 mg Oral BID  guaiFENesin  600 mg Oral BID   hydrALAZINE  25 mg Oral TID   insulin aspart  0-15 Units Subcutaneous TID WC   insulin aspart  0-5 Units Subcutaneous QHS   insulin aspart  3 Units Subcutaneous TID WC   insulin detemir  15 Units Subcutaneous QHS   lactose free nutrition  237 mL Oral TID WC   loratadine  10 mg Oral Daily   methimazole  20 mg Oral BID   metoprolol succinate  25 mg Oral Daily   pantoprazole  40 mg Oral Daily   polyethylene glycol  17 g Oral Daily   rosuvastatin  20 mg Oral Daily   sertraline  50 mg Oral Daily   tamsulosin  0.4 mg Oral Daily   traZODone  150 mg Oral QHS   Warfarin - Pharmacist Dosing Inpatient   Does not apply q1600     Infusions:  heparin 500 Units/hr (07/09/22 0557)   vancomycin Stopped (07/08/22 2221)    PRN Medications: acetaminophen, albuterol, morphine injection, ondansetron (ZOFRAN) IV, mouth rinse, [DISCONTINUED] oxyCODONE **OR** oxyCODONE, sorbitol, traMADol   Patient Profile   61 y.o. male with history of HM III LVAD, chronic systolic CHF/ICM, COPD, PAF. 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   Readmitted for driveline infection and surgical debridement/possible wound vac placement.  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 S/p driveline site debridement 6/8. Surgical wound cultures grew MRSA, repeat debridement 6/23. Driveline debridement with wound vac placement on 7/7. 7/13, 7/20 VAC dressing change. Went back to OR again on 7/31 for debridement and VAC change - Transitioned from daptomycin to vancomycin due to elevated CK.  Vancomycin planned x 6 wks, end date 07/18/22.  - INR 1.2 today. Went to OR yesterday, heparin restarted yesterday post-op (heparin for INR < 1.8). Keep INR 1.4-1.7 range as he will go back to OR next week (08/07).   2.  Chronic systolic CHF: Echo 10/21 with EF 20-25%, mildly decreased RV function. LHC/RHC in 12/21 with patent grafts, low output. Suspect mixed ischemic/nonischemic cardiomyopathy (prior heavy ETOH and drugs as well as CAD).  No ETOH, drugs, smoking since CABG in 12/20. Admitted with cardiogenic shock in 12/21, had placement of Impella 5.5 initially, now s/p Heartmate 3 LVAD on 11/17/20.  Ramp echo 2/23 with speed decreased to 5500 rpm. He would eventually like heart transplant. NYHA class II. Have been allowing MAP to run higher due to recent AKI thought to be due to lowering BP too much in setting of right renal artery stenosis. Had Ramp ECHO with speed optimized. Volume status stable. MAP higher today around 100.  - INR 1.2. Heparin restarted yesterday afternoon - Continue  Toprol XL 25 mg daily.  - Continue hydralazine 25 mg tid.  - PICC placed for long-term antibiotic use 3. AKI: Creatinine up to 4.6 last admission, initially thought this was ATN from low BP but he was never markedly hypotensive and LFTs without any evidence of shock liver. ? Vanc toxicity (troughs were ok). Taken off Entresto and hydralazine. Volume repleted with IVF. In reviewing prior CTA abdomen/pelvis, he has high-grade stenosis of proximal right renal artery. D/w Renal and feel like he may need higher perfusion pressures. Scr now stable, 1.2-1.4 this admit. MAP in 100s as above.  - Would consider reassessment of renal artery stenosis and possible treatment once creatinine has improved to baseline.  4.  CAD: S/p CABG 12/20.  LHC pre-VAD with patent  grafts, no target for intervention. - Continue statin.  5.  Atrial fibrillation: Paroxysmal.  S/p Maze and LA appendage clip with CABG in 12/20. In NSR today  - Continue Toprol XL 25 mg daily.  - Off amiodarone with hyperthyroidism. 6.  Type 2 diabetes: Takes glipizide at home.  - SSI  - Continue Levemir 15 units qHS - Diabetes coordinator following 7. 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  8. 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  He missed his endocrinology appt. Had been rescheduled for July 27th, will need to reschedule again. - He is now off amiodarone.   - Continue methimazole 20 mg bid, TSH was mildly elevated.   9.  Gout: On allopurinol 10. Anemia (chronic blood loss): Got 1u RBCs on 7/12. Hgb 8.6 today    I reviewed the LVAD parameters from today, and compared the results to the patient's prior recorded data.  No programming changes were made.  The LVAD is functioning within specified parameters.  The patient performs LVAD self-test daily.  LVAD interrogation was negative for any significant power changes, alarms or PI events/speed drops.  LVAD equipment check completed and is in good  working order.  Back-up equipment present.   LVAD education done on emergency procedures and precautions and reviewed exit site care.  Length of Stay: [address], AGACNP-BC  07/09/2022 7:01 AM  Agree with the above NP note.   Stable creatinine today, INR 1.2.  MAP around 100.  He went to the OR yesterday for debridement and wound vac change.   General: Well appearing this am. NAD.  HEENT: Normal. Neck: Supple, JVP 7-8 cm. Carotids OK.  Cardiac:  Mechanical heart sounds with LVAD hum present.  Lungs:  CTAB, normal effort.  Abdomen:  NT, ND, no HSM. No bruits or masses. +BS  LVAD exit site: Wound vac.  Extremities:  Warm and dry. No cyanosis, clubbing, rash, or edema.  Neuro:  Alert & oriented x 3. Cranial nerves grossly intact. Moves all 4 extremities w/o difficulty. Affect pleasant    Per Dr. Donata Clay, he will need to return to OR next week for additional debridement (8/7).    For now, will use warfarin and heparin gtt to keep INR 1.4-1.7 range while waiting for return to OR.   If MAP stays high, can carefully increase hydralazine.  Have aimed for MAP 90s with concern for renal artery stenosis and AKI in setting of low MAP.   Marca Ancona 07/09/2022

## 2022-07-09 NOTE — Plan of Care (Signed)
  Problem: Education: Goal: Knowledge of General Education information will improve Description: Including pain rating scale, medication(s)/side effects and non-pharmacologic comfort measures Outcome: Progressing   Problem: Health Behavior/Discharge Planning: Goal: Ability to manage health-related needs will improve Outcome: Progressing   Problem: Clinical Measurements: Goal: Ability to maintain clinical measurements within normal limits will improve Outcome: Progressing Goal: Will remain free from infection Outcome: Progressing Goal: Diagnostic test results will improve Outcome: Progressing Goal: Respiratory complications will improve Outcome: Progressing Goal: Cardiovascular complication will be avoided Outcome: Progressing   Problem: Activity: Goal: Risk for activity intolerance will decrease Outcome: Progressing   Problem: Nutrition: Goal: Adequate nutrition will be maintained Outcome: Progressing   Problem: Coping: Goal: Level of anxiety will decrease Outcome: Progressing   Problem: Elimination: Goal: Will not experience complications related to bowel motility Outcome: Progressing Goal: Will not experience complications related to urinary retention Outcome: Progressing   Problem: Pain Managment: Goal: General experience of comfort will improve Outcome: Progressing   Problem: Safety: Goal: Ability to remain free from injury will improve Outcome: Progressing   Problem: Skin Integrity: Goal: Risk for impaired skin integrity will decrease Outcome: Progressing   Problem: Education: Goal: Ability to describe self-care measures that may prevent or decrease complications (Diabetes Survival Skills Education) will improve Outcome: Progressing Goal: Individualized Educational Video(s) Outcome: Progressing   Problem: Coping: Goal: Ability to adjust to condition or change in health will improve Outcome: Progressing   Problem: Fluid Volume: Goal: Ability to  maintain a balanced intake and output will improve Outcome: Progressing   Problem: Health Behavior/Discharge Planning: Goal: Ability to identify and utilize available resources and services will improve Outcome: Progressing Goal: Ability to manage health-related needs will improve Outcome: Progressing   Problem: Metabolic: Goal: Ability to maintain appropriate glucose levels will improve Outcome: Progressing   Problem: Nutritional: Goal: Maintenance of adequate nutrition will improve Outcome: Progressing Goal: Progress toward achieving an optimal weight will improve Outcome: Progressing   Problem: Skin Integrity: Goal: Risk for impaired skin integrity will decrease Outcome: Progressing   Problem: Tissue Perfusion: Goal: Adequacy of tissue perfusion will improve Outcome: Progressing   Problem: Education: Goal: Ability to demonstrate management of disease process will improve Outcome: Progressing Goal: Ability to verbalize understanding of medication therapies will improve Outcome: Progressing Goal: Individualized Educational Video(s) Outcome: Progressing   Problem: Activity: Goal: Capacity to carry out activities will improve Outcome: Progressing   Problem: Cardiac: Goal: Ability to achieve and maintain adequate cardiopulmonary perfusion will improve Outcome: Progressing   Problem: Education: Goal: Patient will understand all VAD equipment and how it functions Outcome: Progressing Goal: Patient will be able to verbalize current INR target range and antiplatelet therapy for discharge home Outcome: Progressing   Problem: Cardiac: Goal: LVAD will function as expected and patient will experience no clinical alarms Outcome: Progressing   

## 2022-07-09 NOTE — Progress Notes (Signed)
[facility] Infectious Diseases Follow Up Note  Patient Identification: Patient Name: LUAY BALDING MRN: [id number] [place] Date: 06/12/2022 11:02 AM Age: 61 y.o.Today's Date: 07/09/2022  Reason for Visit: Driveline infection   Principal Problem:   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 (LVAD) ([place])   Current Antibiotics:  Daptomycin 7/5-c  Lines/Hardwares: LVAD , PICC Rt UE  Interval Events: continues to be afebrile, no leukocytosis. S/p repeat debridement with wound vac change 7/31  Assessment 61 YM with VAD placed in December 2021 c/b MRSA driveline infection SP I&D x2 on daptomycin(N/V with vancomycin) EOT 7/13 admitted for driveline infections.   #LVAD driveline infection with MRSA -recent admission 6/5-6/26 for LVAD driveline infection. He underwent I&D x 2 (6/8 and 6/23). Intraop Cxfrom 6/8 grew MRSA. ID engaged and pt placed on 4 week sof vancomycin EOT 7/13. He then developed N/V, switched to daptomycin. Returned to OR on 6/23 as pt had persistent exudate. S/p serial OR visits for debridements 7/7, 7/13 (some milky fluid that was expressed from the space),  7/20 and 7/31. OR findings 7/31  " The wound has started to granulate nicely with incorporation of the previously placed tissue matrix for up to 80% of the wound.  The most medial 20% needed some excisional debridement and removal of the lower coating of the power  cord."  Next OR planned pm 8/8.    Recommendations Continue Vancomycin, pharmacy to dose.  Plan for 4 weeks course from 7/13 followed by PO doxycyline thereafter starting 8/11 Monitor CBC, BMP, Vancomycin trough  ID available as needed. Please recall if needed or any concerning findings on next OR  Patient has a fu appointment made with Dr Linus Salmons on 8/16 at 9:30 am D/w ID pharmacy   Rest of the management as per the primary team. Thank you for the consult. Please page with  pertinent questions or concerns.  ______________________________________________________________________ Subjective patient seen and examined at the bedside. No complaints   Past Medical History:  Diagnosis Date   "    Arthritis    CAD (coronary artery disease)    a. s/p CABG in 11/2019 with LIMA-LAD, SVG-OM1, SVG-PDA and SVG-D1   CHF (congestive heart failure) (HCC)    a. EF < 20% by echo in 11/2019   Essential hypertension    PAF (paroxysmal atrial fibrillation) (HCC)    Type 2 diabetes mellitus ([place])    Past Surgical History:  Procedure Laterality Date   APPLICATION OF WOUND VAC Left 06/14/2022   Procedure: APPLICATION OF WOUND VAC;  Surgeon: [name], MD;  Location: [place];  Service: Thoracic;  Laterality: Left;   APPLICATION OF WOUND VAC N/A 06/20/2022   Procedure: VAD POWERCORD TUNNEL WOUND [place];  Surgeon: [name], MD;  Location: [place];  Service: Thoracic;  Laterality: N/A;   APPLICATION OF WOUND VAC N/A 06/27/2022   Procedure: WOUND VAC CHANGE;  Surgeon: [name], MD;  Location: [place];  Service: Thoracic;  Laterality: N/A;   APPLICATION OF WOUND VAC N/A 07/08/2022   Procedure: WOUND VAC CHANGE;  Surgeon: [name], MD;  Location: [place];  Service: Thoracic;  Laterality: N/A;   BACK SURGERY     CLIPPING OF ATRIAL APPENDAGE N/A 11/26/2019   Procedure: Clipping Of Atrial Appendage using AtriCure 40 Clip;  Surgeon: Wonda Olds, MD;  Location: [place];  Service: Open Heart Surgery;  Laterality: N/A;   CORONARY ARTERY BYPASS GRAFT N/A 11/26/2019   Procedure: CORONARY ARTERY BYPASS GRAFTING (CABG)  using endoscopic greater saphenous vein harvest: svc to OM; svc to Diag; svc to PD; and LIMA to LAD.;  Surgeon: Wonda Olds, MD;  Location: [place];  Service: Open Heart Surgery;  Laterality: N/A;   FOOT SURGERY     HAND SURGERY     INSERTION OF IMPLANTABLE LEFT VENTRICULAR ASSIST DEVICE N/A 11/17/2020   Procedure: INSERTION OF IMPLANTABLE LEFT  VENTRICULAR ASSIST DEVICE - HM3;  Surgeon: Wonda Olds, MD;  Location: [place];  Service: Open Heart Surgery;  Laterality: N/A;   INTRAOPERATIVE TRANSESOPHAGEAL ECHOCARDIOGRAM  12/04/2019   Procedure: Intraoperative Transesophageal Echocardiogram;  Surgeon: Wonda Olds, MD;  [facility];  Service: Open Heart Surgery;;   MAZE N/A 11/26/2019   Procedure: MAZE using Bilateral Pulmonary Vein isolation.;  Surgeon: Wonda Olds, MD;  Location: [place];  Service: Open Heart Surgery;  Laterality: N/A;   MULTIPLE EXTRACTIONS WITH ALVEOLOPLASTY N/A 11/16/2020   Procedure: MULTIPLE EXTRACTION WITH ALVEOLOPLASTY;  Surgeon: Charlaine Dalton, DMD;  Location: [place];  Service: Dentistry;  Laterality: N/A;   PLACEMENT OF IMPELLA LEFT VENTRICULAR ASSIST DEVICE N/A 11/24/2019   Procedure: PLACEMENT OF IMPELLA 5.5 LEFT VENTRICULAR ASSIST DEVICE;  Surgeon: Wonda Olds, MD;  Location: [place];  Service: Open Heart Surgery;  Laterality: N/A;   PLACEMENT OF IMPELLA LEFT VENTRICULAR ASSIST DEVICE Left 11/10/2020   Procedure: PLACEMENT OF IMPELLA 5.5 LEFT VENTRICULAR ASSIST DEVICE VIA  LEFT AVILLARY ARTERY;  Surgeon: Wonda Olds, MD;  Location: [place];  Service: Open Heart Surgery;  Laterality: Left;   REMOVAL OF IMPELLA LEFT VENTRICULAR ASSIST DEVICE Right 12/04/2019   Procedure: REMOVAL OF IMPELLA LEFT VENTRICULAR ASSIST DEVICE, right axilla;  Surgeon: Wonda Olds, MD;  Location: [place];  Service: Open Heart Surgery;  Laterality: Right;   RIGHT/LEFT HEART CATH AND CORONARY ANGIOGRAPHY N/A 11/24/2019   Procedure: RIGHT/LEFT HEART CATH AND CORONARY ANGIOGRAPHY;  Surgeon: Larey Dresser, MD;  Location: [place] [facility];  Service: Cardiovascular;  Laterality: N/A;   RIGHT/LEFT HEART CATH AND CORONARY/GRAFT ANGIOGRAPHY N/A 11/09/2020   Procedure: RIGHT/LEFT HEART CATH AND CORONARY/GRAFT ANGIOGRAPHY;  Surgeon: Nelva Bush, MD;  Location: [place] [facility];  Service: Cardiovascular;   Laterality: N/A;   STERNAL WOUND DEBRIDEMENT N/A 05/16/2022   Procedure: VAD DRIVELINE DEBRIDEMENT;  Surgeon: [name], MD;  Location: [place];  Service: Thoracic;  Laterality: N/A;  VANC PULSE LAVAGE   STERNAL WOUND DEBRIDEMENT Left 06/14/2022   Procedure: DEBRIDEMENT OF VAD POWERCORD TUNNEL;  Surgeon: [name], MD;  Location: [place];  Service: Thoracic;  Laterality: Left;   STERNAL WOUND DEBRIDEMENT N/A 06/27/2022   Procedure: ABDOMINAL WOUND DEBRIDEMENT, APPLICATION OF MYRIAD;  Surgeon: [name], MD;  Location: [place];  Service: Thoracic;  Laterality: N/A;   STERNAL WOUND DEBRIDEMENT N/A 07/08/2022   Procedure: ABDOMINAL WOUND DEBRIDEMENT;  Surgeon: [name], MD;  Location: [place];  Service: Thoracic;  Laterality: N/A;   TEE WITHOUT CARDIOVERSION N/A 11/24/2019   Procedure: TRANSESOPHAGEAL ECHOCARDIOGRAM (TEE);  Surgeon: Wonda Olds, MD;  Location: [place];  Service: Open Heart Surgery;  Laterality: N/A;   TEE WITHOUT CARDIOVERSION N/A 11/26/2019   Procedure: TRANSESOPHAGEAL ECHOCARDIOGRAM (TEE);  Surgeon: Wonda Olds, MD;  Location: [place];  Service: Open Heart Surgery;  Laterality: N/A;   TEE WITHOUT CARDIOVERSION N/A 11/10/2020   Procedure: TRANSESOPHAGEAL ECHOCARDIOGRAM (TEE);  Surgeon: Wonda Olds, MD;  Location: [place];  Service: Open Heart Surgery;  Laterality: N/A;   TEE WITHOUT  CARDIOVERSION N/A 11/17/2020   Procedure: TRANSESOPHAGEAL ECHOCARDIOGRAM (TEE);  Surgeon: Wonda Olds, MD;  Location: [place];  Service: Open Heart Surgery;  Laterality: N/A;   WOUND EXPLORATION N/A 05/31/2022   Procedure: WOUND EXPLORATION, Debridement and Irrigation of abdominal wound;  Surgeon: [name], MD;  [facility];  Service: Thoracic;  Laterality: N/A;    Vitals BP 118/85   Pulse 77   Temp 98 F (36.7 C) (Oral)   Resp 14   Ht $R'5\' 10"'fi$  (1.778 m)   Wt 88.6 kg   SpO2 98%   BMI 28.03 kg/m    Physical Exam Constitutional:  sitting up in the chair and  appears comfortable     Comments:   Cardiovascular:     Rate and Rhythm: mechanical heart sounds and humming sound of LVAD    Heart sounds  Pulmonary:     Effort: Pulmonary effort is normal.     Comments:   Abdominal:     Palpations: Abdomen is soft.     Tenderness: LVAD exit site with wound vac  Musculoskeletal:        General: No swelling or tenderness.   Skin:    Comments: RT UE PICC  Neurological:     General: No focal deficit present. Awake, alert and oriented   Psychiatric:        Mood and Affect: Mood normal.     Pertinent Microbiology Results for orders placed or performed during the hospital encounter of 06/12/22  Surgical PCR screen     Status: None   Collection Time: 06/12/22 12:30 PM   Specimen: Nasal Mucosa; Nasal Swab  Result Value Ref Range Status   MRSA, PCR NEGATIVE NEGATIVE Final   Staphylococcus aureus NEGATIVE NEGATIVE Final    Comment: (NOTE) The Xpert SA Assay (FDA approved for NASAL specimens in patients 26 years of age and older), is one component of a comprehensive surveillance program. It is not intended to diagnose infection nor to guide or monitor treatment. Performed at [facility], [place] [address], [place], [place] [address]       Pertinent Lab.    Latest Ref Rng & Units 07/09/2022    5:00 AM 07/08/2022   12:46 AM 07/07/2022    4:17 AM  CBC  WBC 4.0 - 10.5 K/uL 7.7  8.1  7.0   Hemoglobin 13.0 - 17.0 g/dL 8.6  9.8  9.8   Hematocrit 39.0 - 52.0 % 26.2  29.6  29.6   Platelets 150 - 400 K/uL 166  189  176       Latest Ref Rng & Units 07/09/2022    5:00 AM 07/08/2022   12:46 AM 07/07/2022    4:17 AM  CMP  Glucose 70 - 99 mg/dL 186  222  103   BUN 8 - 23 mg/dL 28  32  33   Creatinine 0.61 - 1.24 mg/dL 1.20  1.36  1.29   Sodium 135 - 145 mmol/L 135  137  135   Potassium 3.5 - 5.1 mmol/L 3.5  4.0  3.9   Chloride 98 - 111 mmol/L 102  104  104   CO2 22 - 32 mmol/L $RemoveB'26  25  25   'WWRsWlIV$ Calcium 8.9 - 10.3 mg/dL 8.6  8.9  9.0       Pertinent Imaging today Plain films and CT images have been personally visualized and interpreted; radiology reports have been reviewed. Decision making incorporated into the Impression / Recommendations.  DG CHEST PORT  1 VIEW  Result Date: 06/30/2022 CLINICAL DATA:  Status post PICC line placement. EXAM: PORTABLE CHEST 1 VIEW COMPARISON:  06/13/2022 FINDINGS: The right arm PICC line tip terminates within the mid SVC, unchanged from previous exam. Previous median sternotomy and CABG procedure. LVAD device is again noted projecting over the left ventricular apex. Left atrial clip in place. No pleural effusion or edema. No airspace opacities. IMPRESSION: 1. No active cardiopulmonary abnormalities. 2. Right arm PICC line tip terminates within the mid SVC. Electronically Signed   By: Kerby Moors M.D.   On: 06/30/2022 07:47   ECHOCARDIOGRAM LIMITED  Result Date: 06/17/2022    ECHOCARDIOGRAM LIMITED REPORT   Patient Name:   KAYCEE MCGAUGH Date of Exam: 06/17/2022 Medical Rec #:  [id number]        Height:       70.0 in Accession #:    [id number]       Weight:       181.2 lb Date of Birth:  May 29, 1961        BSA:          2.002 m Patient Age:    22 years         BP:           0/0 mmHg Patient Gender: M                HR:           69 bpm. Exam Location:  Inpatient Procedure: Limited Echo, Limited Color Doppler and Cardiac Doppler Indications:    CHF-Acute Systolic J69.67  History:        Patient has prior history of Echocardiogram examinations, most                 recent 01/10/2022. CHF, CAD, Arrythmias:Atrial Fibrillation; Risk                 Factors:Diabetes and Hypertension.  Sonographer:    Bernadene Person RDCS Referring Phys: [place]  1. LVAD present at LV apex. Speed increased from 5500 to 5700. Septum mildly shifted to the right at 5500, shifted more leftward at 5700. Speed left at 5600 rpm at end of study. Left ventricular ejection fraction, by estimation, is 20 to 25%.  The left ventricle has severely decreased function. The left ventricle demonstrates global hypokinesis. There is mild left ventricular hypertrophy. Left ventricular diastolic parameters are indeterminate.  2. Right ventricular systolic function is mildly reduced. The right ventricular size is moderately enlarged. Tricuspid regurgitation signal is inadequate for assessing PA pressure.  3. Left atrial size was mildly dilated.  4. Right atrial size was mildly dilated.  5. The mitral valve is normal in structure. Mild mitral valve regurgitation. No evidence of mitral stenosis.  6. The aortic valve opened every beat at 5500 rpm, every other beat at 5700 rpm. The aortic valve is tricuspid. Aortic valve regurgitation is not visualized. No aortic stenosis is present.  7. The inferior vena cava is normal in size with greater than 50% respiratory variability, suggesting right atrial pressure of 3 mmHg.  8. LVAD ramp study. FINDINGS  Left Ventricle: LVAD present at LV apex. Speed increased from 5500 to 5700. Septum mildly shifted to the right at 5500, shifted more leftward at 5700. Speed left at 5600 rpm at end of study. Left ventricular ejection fraction, by estimation, is 20 to 25%. The left ventricle has severely decreased function. The left ventricle demonstrates global hypokinesis. The left ventricular internal  cavity size was normal in size. There is mild left ventricular hypertrophy. Left ventricular diastolic parameters are indeterminate. Right Ventricle: The right ventricular size is moderately enlarged. Right ventricular systolic function is mildly reduced. Tricuspid regurgitation signal is inadequate for assessing PA pressure. Left Atrium: Left atrial size was mildly dilated. Right Atrium: Right atrial size was mildly dilated. Mitral Valve: The mitral valve is normal in structure. Mild mitral valve regurgitation. No evidence of mitral valve stenosis. Tricuspid Valve: The tricuspid valve is normal in structure.  Tricuspid valve regurgitation is not demonstrated. Aortic Valve: The aortic valve opened every beat at 5500 rpm, every other beat at 5700 rpm. The aortic valve is tricuspid. Aortic valve regurgitation is not visualized. No aortic stenosis is present. Pulmonic Valve: The pulmonic valve was normal in structure. Pulmonic valve regurgitation is not visualized. Venous: The inferior vena cava is normal in size with greater than 50% respiratory variability, suggesting right atrial pressure of 3 mmHg. IAS/Shunts: No atrial level shunt detected by color flow Doppler. RIGHT VENTRICLE RV S prime:     7.54 cm/s PULMONIC VALVE RVOT Peak grad: 2 mmHg   SHUNTS Pulmonic VTI: 0.131 m [place] Electronically signed by Franki Monte Signature Date/Time: 06/17/2022/10:52:18 AM    Final    CT ABDOMEN PELVIS WO CONTRAST  Result Date: 06/14/2022 CLINICAL DATA:  LVAD drive line infection EXAM: CT ABDOMEN AND PELVIS WITHOUT CONTRAST TECHNIQUE: Multidetector CT imaging of the abdomen and pelvis was performed following the standard protocol without IV contrast. RADIATION DOSE REDUCTION: This exam was performed according to the departmental dose-optimization program which includes automated exposure control, adjustment of the mA and/or kV according to patient size and/or use of iterative reconstruction technique. COMPARISON:  CT abdomen and pelvis dated June 04, 2022 FINDINGS: Lower chest: No acute abnormality. Hepatobiliary: No focal liver abnormality is seen. No gallstones, gallbladder wall thickening, or biliary dilatation. Pancreas: Unremarkable. No pancreatic ductal dilatation or surrounding inflammatory changes. Spleen: Normal in size without focal abnormality. Adrenals/Urinary Tract: Bilateral adrenal glands are unremarkable. No hydronephrosis. Punctate nonobstructing stone of the upper pole the left kidney. Bladder is unremarkable. Stomach/Bowel: Stomach is within normal limits. Appendix appears normal. Diverticulosis. No  evidence of bowel wall thickening, distention, or inflammatory changes. Vascular/Lymphatic: Aortic atherosclerosis. No enlarged abdominal or pelvic lymph nodes. Reproductive: Prostate is unremarkable. Other: Soft tissue stranding and trace fluid seen about the LVAD drive line in the upper abdomen. Musculoskeletal: Prior posterior fusion of L4-S1. No aggressive osseous lesions. IMPRESSION: 1. Soft tissue stranding and trace fluid seen about the LVAD drive line in the upper abdomen, unchanged when compared with prior exam and compatible with history of drive line infection. 2. Punctate nonobstructing stone of the upper pole the left kidney. 3.  Aortic Atherosclerosis (ICD10-I70.0). Electronically Signed   By: Yetta Glassman M.D.   On: 06/14/2022 08:21   DG Chest Port 1 View  Result Date: 06/13/2022 CLINICAL DATA:  Preop EXAM: PORTABLE CHEST 1 VIEW COMPARISON:  Chest radiograph dated June 04, 2022 FINDINGS: The heart size and mediastinal contours are within normal limits. Atherosclerotic calcification of the aortic arch. Sternotomy wires and left atrial appendage clip are unchanged. Left ventricular assist device projecting over the left hemithorax. Both lungs are clear without evidence of focal consolidation or pleural effusion. The visualized skeletal structures are unremarkable. Bilateral axillary surgical clips. IMPRESSION: No active disease. Electronically Signed   By: Keane Police D.O.   On: 06/13/2022 08:33     I spent 60 minutes for this patient  encounter including review of prior medical records, coordination of care with primary/other specialist with greater than 50% of time being face to face/counseling and discussing diagnostics/treatment plan with the patient/family.  Electronically signed by:   Rosiland Oz, MD Infectious Disease Physician [facility] Pager: [phone]

## 2022-07-10 DIAGNOSIS — B9562 Methicillin resistant Staphylococcus aureus infection as the cause of diseases classified elsewhere: Secondary | ICD-10-CM | POA: Diagnosis not present

## 2022-07-10 DIAGNOSIS — T827XXA Infection and inflammatory reaction due to other cardiac and vascular devices, implants and grafts, initial encounter: Secondary | ICD-10-CM | POA: Diagnosis not present

## 2022-07-10 LAB — BASIC METABOLIC PANEL
Anion gap: 8 (ref 5–15)
BUN: 28 mg/dL — ABNORMAL HIGH (ref 8–23)
CO2: 27 mmol/L (ref 22–32)
Calcium: 9 mg/dL (ref 8.9–10.3)
Chloride: 104 mmol/L (ref 98–111)
Creatinine, Ser: 1.2 mg/dL (ref 0.61–1.24)
GFR, Estimated: >60 mL/min (ref >60)
Glucose, Bld: 66 mg/dL — ABNORMAL LOW (ref 70–99)
Potassium: 3.7 mmol/L (ref 3.5–5.1)
Sodium: 139 mmol/L (ref 135–145)

## 2022-07-10 LAB — CBC
HCT: 29.3 % — ABNORMAL LOW (ref 39.0–52.0)
Hemoglobin: 9.5 g/dL — ABNORMAL LOW (ref 13.0–17.0)
MCH: 28.4 pg (ref 26.0–34.0)
MCHC: 32.4 g/dL (ref 30.0–36.0)
MCV: 87.5 fL (ref 80.0–100.0)
Platelets: 181 10*3/uL (ref 150–400)
RBC: 3.35 MIL/uL — ABNORMAL LOW (ref 4.22–5.81)
RDW: 18.6 % — ABNORMAL HIGH (ref 11.5–15.5)
WBC: 7.3 10*3/uL (ref 4.0–10.5)
nRBC: 0 % (ref 0.0–0.2)

## 2022-07-10 LAB — HEPARIN LEVEL (UNFRACTIONATED): Heparin Unfractionated: <0.1 IU/mL — ABNORMAL LOW (ref 0.30–0.70)

## 2022-07-10 LAB — PROTIME-INR
INR: 1.2 (ref 0.8–1.2)
Prothrombin Time: 15 seconds (ref 11.4–15.2)

## 2022-07-10 LAB — GLUCOSE, CAPILLARY
Glucose-Capillary: 201 mg/dL — ABNORMAL HIGH (ref 70–99)
Glucose-Capillary: 103 mg/dL — ABNORMAL HIGH (ref 70–99)
Glucose-Capillary: 200 mg/dL — ABNORMAL HIGH (ref 70–99)
Glucose-Capillary: 190 mg/dL — ABNORMAL HIGH (ref 70–99)

## 2022-07-10 LAB — LACTATE DEHYDROGENASE: LDH: 124 U/L (ref 98–192)

## 2022-07-10 MED ORDER — DOXYCYCLINE HYCLATE 100 MG PO TABS: 100.0000 mg | ORAL_TABLET | Freq: Two times a day (BID) | ORAL | Status: DC

## 2022-07-10 MED ORDER — WARFARIN SODIUM 2.5 MG PO TABS
2.5000 mg | ORAL_TABLET | ORAL | Status: AC
  Administered 2022-07-10: 2.5 mg via ORAL
  Filled 2022-07-10: qty 1

## 2022-07-10 NOTE — Progress Notes (Signed)
LVAD Coordinator Rounding Note:  Admitted 06/12/22 to Dr. Alford Highland service due to drive line debridement scheduled with Dr Donata Clay 06/14/22.   S/p driveline site debridement and wound vac on 7/7.  7/10 Had Ramp ECHO. Speed optimized. 7/13 OR to [facility] dressing change.  Back on Heparin drip.  7/20 OR for debridement and VAC dressing change  Pt up in the chair this morning. States that he feels good. Denies pain. Says he has been outside twice already this morning.   Wound vac in place. No alarms. Suction at -125.  Transitioned from daptomycin to vancomycin due to elevated CK. Vancomycin planned x 6 weeks; end date 07/18/22.   Will need to reschedule appt w/[facility] at discharge.   Vital signs: Temp:  97.8 HR: 84 Doppler Pressure: 98 Automatic BP: 109/98 O2 Sat: 95% on RA Wt: 171.7>174.6>178.6>182.9>181>...189.1>189.1>190.9>192>190.7>195.3>198.5 lbs   LVAD interrogation reveals:  Speed: 5600 Flow:  4.4 Power:  4.1 w PI: 2.9 Alarms: on batteries Events: on batteries Hct: 29  Fixed speed:  5600 Low speed limit:  5300  Drive Line:  Wound vac in place. Suction at -125; no leaks noted; draining thin brown cloudy fluid. Anchor secure in drive line.    Labs:  LDH trend: 249>244>208>22>229>199>210>189>163>160>147>157>147>145>169>149>122>143>152>119>124  INR trend: 1.3 >1.3>1.2>1.1>1.1>1.1>1.1>1.2>1.3>1.5>1.6>1.7>1.7>1.5>1.7>1.8>1.9>1.4>1.2>1.2  Anticoagulation Plan: -INR Goal: 2.0 - 2.5 -ASA Dose: none  ICD: N/A  Drips:  Heparin - 500 units/hr  Infection:  06/07/22>> repeat drive line culture>> abundant MRSA; final  Blood Products: - 06/21/22>> 2 PRBC   Plan/Recommendations:  Page VAD coordinators for equipment or drive line issues    Hessie Diener RN VAD Coordinator  Office: [phone]  24/7 Pager: [phone]

## 2022-07-10 NOTE — Anesthesia Postprocedure Evaluation (Signed)
Anesthesia Post Note  Patient: Kristoph E Kendle  Procedure(s) Performed: ABDOMINAL WOUND DEBRIDEMENT (Abdomen) WOUND VAC CHANGE (Abdomen)     Patient location during evaluation: PACU Anesthesia Type: General Level of consciousness: awake and alert Pain management: pain level controlled Vital Signs Assessment: post-procedure vital signs reviewed and stable Respiratory status: spontaneous breathing, nonlabored ventilation and respiratory function stable Cardiovascular status: stable Postop Assessment: no apparent nausea or vomiting Anesthetic complications: no   No notable events documented.  Last Vitals:  Vitals:   07/10/22 0406 07/10/22 0725  BP: (!) 100/90 (!) 109/98  Pulse:  84  Resp: 16 15  Temp: 36.5 C 36.6 C  SpO2:  95%    Last Pain:  Vitals:   07/10/22 0725  TempSrc: Oral  PainSc: 0-No pain                 Yiannis Tulloch     

## 2022-07-10 NOTE — Progress Notes (Addendum)
Patient ID: Kevin Underwood, male   DOB: November 22, 1961, 61 y.o.   MRN: [id number]   Advanced Heart Failure VAD Team Note  PCP-Cardiologist: Nona Dell, MD   Subjective:    S/p driveline site debridement and wound vac on 7/7.  7/10 Had Ramp ECHO. Speed optimized. 7/13 OR to VAC change.  Back on Heparin drip.  7/20 OR for debridement and VAC change 7/31 OR for debriedment and VAC change  Plan for OR again next week.   Scr stable 1.20 today. Doppler MAPs in the 90s.   INR 1.2, on heparin gtt.   Feels well today. No complaints. Denies dyspnea.   LVAD INTERROGATION:  HeartMate III LVAD:   Flow 4.4 liters/min, speed 5600, power 4.1 PI 3.0. VAD interrogated personally. Parameters stable.  Objective:    Vital Signs:   Temp:  [97.7 F (36.5 C)-98.2 F (36.8 C)] 97.9 F (36.6 C) (08/02 1145) Pulse Rate:  [71-84] 80 (08/02 1145) Resp:  [11-16] 16 (08/02 1145) BP: (100-130)/(58-98) 108/58 (08/02 1145) SpO2:  [95 %-99 %] 95 % (08/02 1145) Weight:  [88.7 kg] 88.7 kg (08/02 0525) Last BM Date : 07/06/22 Mean arterial Pressure  90s  Intake/Output:   Intake/Output Summary (Last 24 hours) at 07/10/2022 1312 Last data filed at 07/10/2022 0742 Gross per 24 hour  Intake 586.2 ml  Output 75 ml  Net 511.2 ml     Physical Exam   Physical Exam: General:  well appearing. Sitting up in chair. No respiratory difficulty HEENT: normal Neck: supple. JVD not elevated. Carotids 2+ bilat; no bruits. No lymphadenopathy or thyromegaly appreciated. Cor: Mechanical heart sounds with LVAD hum present.  Lungs: CTAB  Abdomen: soft, nontender, nondistended. No hepatosplenomegaly. No bruits or masses. Good bowel sounds. Driveline site with wound vac in place.  Extremities: no cyanosis, clubbing, rash, edema  Neuro: alert & oriented x 3, cranial nerves grossly intact. moves all 4 extremities w/o difficulty. Affect pleasant.   Telemetry   NSR 70's (Personally reviewed)    Labs   Basic Metabolic  Panel: Recent Labs  Lab 07/06/22 0545 07/07/22 0417 07/08/22 0046 07/09/22 0500 07/10/22 0410  NA 138 135 137 135 139  K 4.1 3.9 4.0 3.5 3.7  CL 104 104 104 102 104  CO2 28 25 25 26 27   GLUCOSE 149* 103* 222* 186* 66*  BUN 31* 33* 32* 28* 28*  CREATININE 1.29* 1.29* 1.36* 1.20 1.20  CALCIUM 9.0 9.0 8.9 8.6* 9.0    Liver Function Tests: No results for input(s): "AST", "ALT", "ALKPHOS", "BILITOT", "PROT", "ALBUMIN" in the last 168 hours.  No results for input(s): "LIPASE", "AMYLASE" in the last 168 hours. No results for input(s): "AMMONIA" in the last 168 hours.  CBC: Recent Labs  Lab 07/06/22 0545 07/07/22 0417 07/08/22 0046 07/09/22 0500 07/10/22 0410  WBC 7.5 7.0 8.1 7.7 7.3  HGB 9.5* 9.8* 9.8* 8.6* 9.5*  HCT 29.2* 29.6* 29.6* 26.2* 29.3*  MCV 86.9 86.8 85.5 86.8 87.5  PLT 174 176 189 166 181    INR: Recent Labs  Lab 07/06/22 0545 07/07/22 0417 07/08/22 0046 07/09/22 0500 07/10/22 0410  INR 2.0* 1.7* 1.4* 1.2 1.2    Other results:   Imaging   No results found.   Medications:     Scheduled Medications:  allopurinol  100 mg Oral Daily   Chlorhexidine Gluconate Cloth  6 each Topical Daily   docusate sodium  200 mg Oral BID   [START ON 07/19/2022] doxycycline  100 mg Oral Q12H  gabapentin  600 mg Oral BID   guaiFENesin  600 mg Oral BID   hydrALAZINE  25 mg Oral TID   insulin aspart  0-15 Units Subcutaneous TID WC   insulin aspart  0-5 Units Subcutaneous QHS   insulin aspart  3 Units Subcutaneous TID WC   insulin detemir  15 Units Subcutaneous QHS   lactose free nutrition  237 mL Oral TID WC   loratadine  10 mg Oral Daily   methimazole  20 mg Oral BID   metoprolol succinate  25 mg Oral Daily   pantoprazole  40 mg Oral Daily   polyethylene glycol  17 g Oral Daily   rosuvastatin  20 mg Oral Daily   sertraline  50 mg Oral Daily   tamsulosin  0.4 mg Oral Daily   traZODone  150 mg Oral QHS   warfarin  5 mg Oral q1600   Warfarin - Pharmacist  Dosing Inpatient   Does not apply q1600    Infusions:  heparin 500 Units/hr (07/10/22 1212)   vancomycin 1,250 mg (07/09/22 2223)    PRN Medications: acetaminophen, albuterol, morphine injection, ondansetron (ZOFRAN) IV, mouth rinse, [DISCONTINUED] oxyCODONE **OR** oxyCODONE, sorbitol, traMADol   Patient Profile   61 y.o. male with history of HM III LVAD, chronic systolic CHF/ICM, COPD, PAF. 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   Readmitted for driveline infection and surgical debridement/possible wound vac placement.  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 S/p driveline site debridement 6/8. Surgical wound cultures grew MRSA, repeat debridement 6/23. Driveline debridement with wound vac placement on 7/7. 7/13, 7/20 VAC dressing change. Went back to OR again on 7/31 for debridement and VAC change - Transitioned from daptomycin to vancomycin due to elevated CK.  Vancomycin planned x 6 wks, end date 07/18/22.  - INR 1.2 today. Heparin restarted post-op (heparin for INR < 1.8). Keep INR 1.4-1.7 range as he will go back to OR next week (08/07).   2.  Chronic systolic CHF: Echo 10/21 with EF 20-25%, mildly decreased RV function. LHC/RHC in 12/21 with patent grafts, low output. Suspect mixed ischemic/nonischemic cardiomyopathy (prior heavy ETOH and drugs as well as CAD).  No ETOH, drugs, smoking since CABG in 12/20. Admitted with cardiogenic shock in 12/21, had placement of Impella 5.5 initially, now s/p Heartmate 3 LVAD on 11/17/20.  Ramp echo 2/23 with speed decreased to 5500 rpm. He would eventually like heart transplant. NYHA class II. Have been allowing MAP to run higher due to recent AKI thought to be due to lowering BP too much in setting of right renal artery stenosis. Had Ramp ECHO with speed optimized. Volume status stable. MAPs 90s   - INR 1.2. continue heparin gtt  - Continue Toprol XL 25 mg daily.  -  Continue hydralazine 25 mg tid.  - PICC placed for long-term antibiotic use 3. AKI: Creatinine up to 4.6 last admission, initially thought this was ATN from low BP but he was never markedly hypotensive and LFTs without any evidence of shock liver. ? Vanc toxicity (troughs were ok). Taken off Entresto and hydralazine. Volume repleted with IVF. In reviewing prior CTA abdomen/pelvis, he has high-grade stenosis of proximal right renal artery. D/w Renal and feel like he may need higher perfusion pressures. Scr now stable, 1.2-1.4 this admit. MAPs ok in 90s  - Would consider reassessment of renal artery stenosis and possible treatment once creatinine has improved to baseline.  4.  CAD: S/p CABG 12/20.  LHC pre-VAD with patent grafts, no target for intervention. - Continue statin.  5.  Atrial fibrillation: Paroxysmal.  S/p Maze and LA appendage clip with CABG in 12/20. In NSR today  - Continue Toprol XL 25 mg daily.  - Off amiodarone with hyperthyroidism. 6.  Type 2 diabetes: Takes glipizide at home.  - SSI  - Continue Levemir 15 units qHS - Diabetes coordinator following 7. 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  8. 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  He missed his endocrinology appt. Had been rescheduled for July 27th, will need to reschedule again. - He is now off amiodarone.   - Continue methimazole 20 mg bid, TSH was mildly elevated.   9.  Gout: On allopurinol 10. Anemia (chronic blood loss): Got 1u RBCs on 7/12. Hgb 9.5 today   I reviewed the LVAD parameters from today, and compared the results to the patient's prior recorded data.  No programming changes were made.  The LVAD is functioning within specified parameters.  The patient performs LVAD self-test daily.  LVAD interrogation was negative for any significant power changes, alarms or PI events/speed drops.  LVAD equipment check completed and is in good working order.  Back-up equipment  present.   LVAD education done on emergency procedures and precautions and reviewed exit site care.  Length of Stay: [address] PA-C  07/10/2022 1:12 PM  Agree with the above PA note.    Stable today, creatinine 1.2.  No dyspnea.  On heparin gtt.   General: Well appearing this am. NAD.  HEENT: Normal. Neck: Supple, JVP 7-8 cm. Carotids OK.  Cardiac:  Mechanical heart sounds with LVAD hum present.  Lungs:  CTAB, normal effort.  Abdomen:  NT, ND, no HSM. No bruits or masses. +BS  LVAD exit site: Wound vac Extremities:  Warm and dry. No cyanosis, clubbing, rash, or edema.  Neuro:  Alert & oriented x 3. Cranial nerves grossly intact. Moves all 4 extremities w/o difficulty. Affect pleasant    Plan for OR again next week for repeat debridement of driveline site (8/7).   No changes today.  Loralie Champagne. 07/10/2022

## 2022-07-10 NOTE — Progress Notes (Signed)
PHARMACY CONSULT NOTE FOR:  OUTPATIENT  PARENTERAL ANTIBIOTIC THERAPY (OPAT)  Indication: VAD Driveline Infection - MRSA  Regimen: Vancomycin 1250 mg every 24 hours  End date: 07/18/22   IV antibiotic discharge orders are pended. To discharging provider:  please sign these orders via discharge navigator,  Select New Orders & click on the button choice - Manage This Unsigned Work.     Thank you for allowing pharmacy to be a part of this patient's care.  Sharin Mons, PharmD, BCPS, BCIDP Infectious Diseases Clinical Pharmacist Phone: [phone] 07/10/2022, 8:24 AM

## 2022-07-10 NOTE — Anesthesia Postprocedure Evaluation (Signed)
Anesthesia Post Note  Patient: Kevin Underwood  Procedure(s) Performed: ABDOMINAL WOUND DEBRIDEMENT (Abdomen) WOUND VAC CHANGE (Abdomen)     Patient location during evaluation: PACU Anesthesia Type: General Level of consciousness: awake and alert Pain management: pain level controlled Vital Signs Assessment: post-procedure vital signs reviewed and stable Respiratory status: spontaneous breathing, nonlabored ventilation and respiratory function stable Cardiovascular status: stable Postop Assessment: no apparent nausea or vomiting Anesthetic complications: no   No notable events documented.  Last Vitals:  Vitals:   07/10/22 0406 07/10/22 0725  BP: (!) 100/90 (!) 109/98  Pulse:  84  Resp: 16 15  Temp: 36.5 C 36.6 C  SpO2:  95%    Last Pain:  Vitals:   07/10/22 0725  TempSrc: Oral  PainSc: 0-No pain                 Lavada Langsam

## 2022-07-10 NOTE — Progress Notes (Signed)
ANTICOAGULATION CONSULT NOTE  Pharmacy Consult for warfarin + heparin Indication:  LVAD  No Known Allergies  Patient Measurements: Height: 5\' 10"  (177.8 cm) Weight: 88.7 kg (195 lb 8 oz) IBW/kg (Calculated) : 73 Heparin Dosing Weight: 80kg  Vital Signs: Temp: 97.9 F (36.6 C) (08/02 1145) Temp Source: Oral (08/02 1145) BP: 108/58 (08/02 1145) Pulse Rate: 80 (08/02 1145)  Labs: Recent Labs    07/08/22 0046 07/09/22 0500 07/10/22 0410  HGB 9.8* 8.6* 9.5*  HCT 29.6* 26.2* 29.3*  PLT 189 166 181  LABPROT 16.6* 15.4* 15.0  INR 1.4* 1.2 1.2  HEPARINUNFRC <0.10* <0.10* <0.10*  CREATININE 1.36* 1.20 1.20     Estimated Creatinine Clearance: 72.5 mL/min (by C-G formula based on SCr of 1.2 mg/dL).   Medical History: Past Medical History:  Diagnosis Date   "    Arthritis    CAD (coronary artery disease)    a. s/p CABG in 11/2019 with LIMA-LAD, SVG-OM1, SVG-PDA and SVG-D1   CHF (congestive heart failure) (HCC)    a. EF < 20% by echo in 11/2019   Essential hypertension    PAF (paroxysmal atrial fibrillation) (HCC)    Type 2 diabetes mellitus (HCC)     Assessment: 73 yoM with HM3 LVAD admitted with ongoing LVAD driveline infection. Pt with planned debridements in OR. Pt is on warfarin PTA with last dose taken 7/3.   Pt s/p OR for infected wound debridement 7/7, 7/13,7/20, 7/31, next planned 8/7. Heparin resumed postop at fixed low rate 500 uts/hr HL remains < 0.1. Warfarin resumed post op, targeting lower end since pt will need repeat debridement in a week. INR 1.2, CBC and LDH stable.   *Home warfarin dose 6mg  MWF, 4mg  AODs  Goal of Therapy:  INR goal 2-2.5 - keep closer to 1.4-1.7 for OR next week Heparin level < 0.3 units/ml Monitor platelets by anticoagulation protocol: Yes   Plan:  Continue heparin 500 units/h  Warfarin 7.5mg  x1 today then 5mg  PO daily  Daily INR, heparin level, CBC    10/7 Pharm.D. CPP, BCPS Clinical  Pharmacist [phone] 07/10/2022 4:02 PM   Please check AMION for all [facility] Pharmacy numbers 07/10/2022

## 2022-07-11 ENCOUNTER — Other Ambulatory Visit (HOSPITAL_COMMUNITY): Payer: Self-pay | Admitting: Cardiology

## 2022-07-11 DIAGNOSIS — Z95811 Presence of heart assist device: Secondary | ICD-10-CM

## 2022-07-11 DIAGNOSIS — E119 Type 2 diabetes mellitus without complications: Secondary | ICD-10-CM

## 2022-07-11 LAB — BASIC METABOLIC PANEL
Anion gap: 7 (ref 5–15)
BUN: 27 mg/dL — ABNORMAL HIGH (ref 8–23)
CO2: 28 mmol/L (ref 22–32)
Calcium: 8.9 mg/dL (ref 8.9–10.3)
Chloride: 104 mmol/L (ref 98–111)
Creatinine, Ser: 1.22 mg/dL (ref 0.61–1.24)
GFR, Estimated: >60 mL/min (ref >60)
Glucose, Bld: 157 mg/dL — ABNORMAL HIGH (ref 70–99)
Potassium: 3.8 mmol/L (ref 3.5–5.1)
Sodium: 139 mmol/L (ref 135–145)

## 2022-07-11 LAB — GLUCOSE, CAPILLARY
Glucose-Capillary: 136 mg/dL — ABNORMAL HIGH (ref 70–99)
Glucose-Capillary: 197 mg/dL — ABNORMAL HIGH (ref 70–99)
Glucose-Capillary: 113 mg/dL — ABNORMAL HIGH (ref 70–99)
Glucose-Capillary: 167 mg/dL — ABNORMAL HIGH (ref 70–99)

## 2022-07-11 LAB — PROTIME-INR
INR: 1.2 (ref 0.8–1.2)
Prothrombin Time: 14.7 seconds (ref 11.4–15.2)

## 2022-07-11 LAB — CBC
HCT: 28.6 % — ABNORMAL LOW (ref 39.0–52.0)
Hemoglobin: 9.3 g/dL — ABNORMAL LOW (ref 13.0–17.0)
MCH: 28.7 pg (ref 26.0–34.0)
MCHC: 32.5 g/dL (ref 30.0–36.0)
MCV: 88.3 fL (ref 80.0–100.0)
Platelets: 185 10*3/uL (ref 150–400)
RBC: 3.24 MIL/uL — ABNORMAL LOW (ref 4.22–5.81)
RDW: 18.5 % — ABNORMAL HIGH (ref 11.5–15.5)
WBC: 7 10*3/uL (ref 4.0–10.5)
nRBC: 0 % (ref 0.0–0.2)

## 2022-07-11 LAB — LACTATE DEHYDROGENASE: LDH: 126 U/L (ref 98–192)

## 2022-07-11 LAB — HEPARIN LEVEL (UNFRACTIONATED): Heparin Unfractionated: <0.1 IU/mL — ABNORMAL LOW (ref 0.30–0.70)

## 2022-07-11 MED ORDER — SIMETHICONE 80 MG PO CHEW
80.0000 mg | CHEWABLE_TABLET | Freq: Four times a day (QID) | ORAL | Status: DC
  Administered 2022-07-11 – 2022-07-18 (×19): 80 mg via ORAL
  Filled 2022-07-11 (×20): qty 1

## 2022-07-11 NOTE — Progress Notes (Signed)
3 Days Post-Op Procedure(s) (LRB): ABDOMINAL WOUND DEBRIDEMENT (N/A) WOUND VAC CHANGE (N/A) Subjective: VAC working well  C/o abdominal distension- no guarding with bowel sounds present Afeb, normal WBC  Objective: Vital signs in last 24 hours: Temp:  [97.4 F (36.3 C)-98.4 F (36.9 C)] 98.4 F (36.9 C) (08/03 1613) Pulse Rate:  [74-80] 77 (08/03 1613) Cardiac Rhythm: Normal sinus rhythm (08/03 1600) Resp:  [16-20] 16 (08/03 1613) BP: (102-130)/(74-96) 130/96 (08/03 1613) SpO2:  [94 %-98 %] 95 % (08/03 1600) Weight:  [88 kg] 88 kg (08/03 0323)  Hemodynamic parameters for last 24 hours:  nsr  Intake/Output from previous day: 08/02 0701 - 08/03 0700 In: 1220.6 [P.O.:840; I.V.:121.5; IV Piggyback:259.1] Out: 670 [Urine:550; Drains:120] Intake/Output this shift: Total I/O In: 45 [I.V.:45] Out: 50 [Drains:50]  EXAM Abdomen distended- prob air  Lab Results: Recent Labs    07/10/22 0410 07/11/22 0330  WBC 7.3 7.0  HGB 9.5* 9.3*  HCT 29.3* 28.6*  PLT 181 185   BMET:  Recent Labs    07/10/22 0410 07/11/22 0330  NA 139 139  K 3.7 3.8  CL 104 104  CO2 27 28  GLUCOSE 66* 157*  BUN 28* 27*  CREATININE 1.20 1.22  CALCIUM 9.0 8.9    PT/INR:  Recent Labs    07/11/22 0330  LABPROT 14.7  INR 1.2   ABG    Component Value Date/Time   PHART 7.508 (H) 11/20/2020 0438   HCO3 24.8 09/09/2021 0313   TCO2 29 11/20/2020 0438   ACIDBASEDEF 6.0 (H) 11/24/2019 1718   O2SAT 63.4 09/09/2021 0313   CBG (last 3)  Recent Labs    07/11/22 0614 07/11/22 1118 07/11/22 1534  GLUCAP 136* 197* 113*    Assessment/Plan: S/P Procedure(s) (LRB): ABDOMINAL WOUND DEBRIDEMENT (N/A) WOUND VAC CHANGE (N/A) Check KUB in am Po mylicon   LOS: 29 days    Lovett Sox 07/11/2022

## 2022-07-11 NOTE — Progress Notes (Addendum)
ANTICOAGULATION CONSULT NOTE  Pharmacy Consult for warfarin + heparin Indication:  LVAD  No Known Allergies  Patient Measurements: Height: 5\' 10"  (177.8 cm) Weight: 88 kg (194 lb 0.1 oz) IBW/kg (Calculated) : 73 Heparin Dosing Weight: 80kg  Vital Signs: Temp: 98 F (36.7 C) (08/03 1119) Temp Source: Oral (08/03 1119) BP: 108/83 (08/03 1119) Pulse Rate: 75 (08/03 0930)  Labs: Recent Labs    07/09/22 0500 07/10/22 0410 07/11/22 0330  HGB 8.6* 9.5* 9.3*  HCT 26.2* 29.3* 28.6*  PLT 166 181 185  LABPROT 15.4* 15.0 14.7  INR 1.2 1.2 1.2  HEPARINUNFRC <0.10* <0.10* <0.10*  CREATININE 1.20 1.20 1.22     Estimated Creatinine Clearance: 71 mL/min (by C-G formula based on SCr of 1.22 mg/dL).   Medical History: Past Medical History:  Diagnosis Date   "    Arthritis    CAD (coronary artery disease)    a. s/p CABG in 11/2019 with LIMA-LAD, SVG-OM1, SVG-PDA and SVG-D1   CHF (congestive heart failure) (HCC)    a. EF < 20% by echo in 11/2019   Essential hypertension    PAF (paroxysmal atrial fibrillation) (HCC)    Type 2 diabetes mellitus (HCC)     Assessment: 60 yoM with HM3 LVAD admitted with ongoing LVAD driveline infection. Pt with planned debridements in OR. Pt is on warfarin PTA.  Pt s/p multiple debridements for wound infection, last 7/31. Per Dr. 8/31 - ok to continue warfarin with INR close to ~2 around debridements.  INR today is subtherapeutic at 1.2, CBC and LDH stable. Heparin level <0.1 as expected.   *Home warfarin dose 6mg  MWF, 4mg  AODs  Goal of Therapy:  INR goal 2-2.5 Heparin level < 0.3 units/ml Monitor platelets by anticoagulation protocol: Yes   Plan:  Continue heparin 500 units/h  Warfarin 5mg  x1 today  Daily INR, heparin level, CBC, LDH   Donata Clay, PharmD, [place], [facility] Clinical Pharmacist [phone] Please check AMION for all [facility] Pharmacy numbers 07/11/2022

## 2022-07-11 NOTE — Progress Notes (Signed)
LVAD Coordinator Rounding Note:  Admitted 06/12/22 to Dr. Alford Highland service due to drive line debridement scheduled with Dr Donata Clay 06/14/22.   S/p driveline site debridement and wound vac on 7/7.  7/10 Had Ramp ECHO. Speed optimized. 7/13 OR to [facility] dressing change.  Back on Heparin drip.  7/20 OR for debridement and VAC dressing change  Pt walking around the room this morning. States that he feels good. Denies pain.  Wound vac in place. No alarms. Suction at -125.  Transitioned from daptomycin to vancomycin due to elevated CK. Vancomycin planned x 6 weeks; end date 07/18/22. Start Doxycycline 8/11.  Will need to reschedule appt w/[facility] at discharge.   Vital signs: Temp:  97.8 HR: 75 Doppler Pressure: 100 Automatic BP: 102/84 (92) O2 Sat: 96% on RA Wt: 171.7>174.6>178.6>182.9>181>...189.1>189.1>190.9>192>190.7>195.3>198.5>194 lbs   LVAD interrogation reveals:  Speed: 5600 Flow:  4.3 Power:  4.2 w PI: 3.0 Alarms: on batteries Events: on batteries Hct: 29  Fixed speed:  5600 Low speed limit:  5300  Drive Line:  Wound vac in place. Suction at -125; no leaks noted; draining thin brown cloudy fluid. Anchor secure in drive line.    Labs:  LDH trend: 249>244>208>22>229>199>210>189>163>160>147>157>147>145>169>149>122>143>152>119>124>126  INR trend: 1.3 >1.3>1.2>1.1>1.1>1.1>1.1>1.2>1.3>1.5>1.6>1.7>1.7>1.5>1.7>1.8>1.9>1.4>1.2>1.2  Anticoagulation Plan: -INR Goal: 2.0 - 2.5 -ASA Dose: none  ICD: N/A  Drips:  Heparin - 500 units/hr  Infection:  06/07/22>> repeat drive line culture>> abundant MRSA; final  Blood Products: - 06/21/22>> 2 PRBC   Plan/Recommendations:  Page VAD coordinators for equipment or drive line issues    Hessie Diener RN VAD Coordinator  Office: [phone]  24/7 Pager: [phone]

## 2022-07-11 NOTE — Progress Notes (Addendum)
Patient ID: Kevin Underwood, male   DOB: [birth year], 61 y.o.   MRN: [id number]   Advanced Heart Failure VAD Team Note  PCP-Cardiologist: Nona Dell, MD   Subjective:    S/p driveline site debridement and wound vac on 7/7.  7/10 Had Ramp ECHO. Speed optimized. 7/13 OR to VAC change.  Back on Heparin drip.  7/20 OR for debridement and VAC change 7/31 OR for debriedment and VAC change  Plan for OR again next week 8/7  Scr stable 1.22 today. Doppler MAPs in the 90-100s.   INR 1.2, on heparin gtt.   Feels good today. Sitting up eating breakfast. Denies CP and SOB.   LVAD INTERROGATION:  HeartMate III LVAD:   Flow 4.6 liters/min, speed 5600, power 4.2 PI 2.7. VAD interrogated personally. Parameters stable.  Objective:    Vital Signs:   Temp:  [97.8 F (36.6 C)-98.2 F (36.8 C)] 97.8 F (36.6 C) (08/03 0323) Pulse Rate:  [72-84] 75 (08/03 0323) Resp:  [15-18] 16 (08/03 0323) BP: (102-109)/(58-98) 102/84 (08/03 0323) SpO2:  [95 %-98 %] 96 % (08/03 0323) Weight:  [88 kg] 88 kg (08/03 0323) Last BM Date : 07/06/22 Mean arterial Pressure  90-100s  Intake/Output:   Intake/Output Summary (Last 24 hours) at 07/11/2022 0703 Last data filed at 07/11/2022 0323 Gross per 24 hour  Intake 1220.59 ml  Output 670 ml  Net 550.59 ml     Physical Exam   Physical Exam: General:  well appearing. Looks stated age. No respiratory difficulty HEENT: normal Neck: supple. JVD flat. Carotids 2+ bilat; no bruits. No lymphadenopathy or thyromegaly appreciated. Cor: Mechanical heart sounds with LVAD hum present. Lungs: clear Abdomen: soft, nontender, nondistended. No hepatosplenomegaly. No bruits or masses. Good bowel sounds. Driveline site with wound vac.  Extremities: no cyanosis, clubbing, rash, edema. PICC RUE Neuro: alert & oriented x 3, cranial nerves grossly intact. moves all 4 extremities w/o difficulty. Affect pleasant.   Telemetry   NSR 80's (Personally reviewed)    Labs    Basic Metabolic Panel: Recent Labs  Lab 07/07/22 0417 07/08/22 0046 07/09/22 0500 07/10/22 0410 07/11/22 0330  NA 135 137 135 139 139  K 3.9 4.0 3.5 3.7 3.8  CL 104 104 102 104 104  CO2 25 25 26 27 28   GLUCOSE 103* 222* 186* 66* 157*  BUN 33* 32* 28* 28* 27*  CREATININE 1.29* 1.36* 1.20 1.20 1.22  CALCIUM 9.0 8.9 8.6* 9.0 8.9    Liver Function Tests: No results for input(s): "AST", "ALT", "ALKPHOS", "BILITOT", "PROT", "ALBUMIN" in the last 168 hours.  No results for input(s): "LIPASE", "AMYLASE" in the last 168 hours. No results for input(s): "AMMONIA" in the last 168 hours.  CBC: Recent Labs  Lab 07/07/22 0417 07/08/22 0046 07/09/22 0500 07/10/22 0410 07/11/22 0330  WBC 7.0 8.1 7.7 7.3 7.0  HGB 9.8* 9.8* 8.6* 9.5* 9.3*  HCT 29.6* 29.6* 26.2* 29.3* 28.6*  MCV 86.8 85.5 86.8 87.5 88.3  PLT 176 189 166 181 185    INR: Recent Labs  Lab 07/07/22 0417 07/08/22 0046 07/09/22 0500 07/10/22 0410 07/11/22 0330  INR 1.7* 1.4* 1.2 1.2 1.2    Other results:   Imaging   No results found.   Medications:     Scheduled Medications:  allopurinol  100 mg Oral Daily   Chlorhexidine Gluconate Cloth  6 each Topical Daily   docusate sodium  200 mg Oral BID   [START ON 07/19/2022] doxycycline  100 mg Oral Q12H  gabapentin  600 mg Oral BID   guaiFENesin  600 mg Oral BID   hydrALAZINE  25 mg Oral TID   insulin aspart  0-15 Units Subcutaneous TID WC   insulin aspart  0-5 Units Subcutaneous QHS   insulin aspart  3 Units Subcutaneous TID WC   insulin detemir  15 Units Subcutaneous QHS   lactose free nutrition  237 mL Oral TID WC   loratadine  10 mg Oral Daily   methimazole  20 mg Oral BID   metoprolol succinate  25 mg Oral Daily   pantoprazole  40 mg Oral Daily   polyethylene glycol  17 g Oral Daily   rosuvastatin  20 mg Oral Daily   sertraline  50 mg Oral Daily   tamsulosin  0.4 mg Oral Daily   traZODone  150 mg Oral QHS   warfarin  5 mg Oral q1600    Warfarin - Pharmacist Dosing Inpatient   Does not apply q1600    Infusions:  heparin 500 Units/hr (07/11/22 0323)   vancomycin Stopped (07/10/22 2340)    PRN Medications: acetaminophen, albuterol, morphine injection, ondansetron (ZOFRAN) IV, mouth rinse, [DISCONTINUED] oxyCODONE **OR** oxyCODONE, sorbitol, traMADol   Patient Profile   61 y.o. male with history of HM III LVAD, chronic systolic CHF/ICM, COPD, PAF. 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   Readmitted for driveline infection and surgical debridement/possible wound vac placement.  Assessment/Plan:    1. Driveline infection: Recent driveline infection [id number]. S/p driveline site debridement 6/8. Surgical wound cultures grew MRSA, repeat debridement 6/23. Driveline debridement with wound vac placement on 7/7. 7/13, 7/20 VAC dressing change. Went back to OR again on 7/31 for debridement and VAC change - Transitioned from daptomycin to vancomycin due to elevated CK.  Vancomycin planned x 6 wks, end date 07/18/22.  - INR 1.2 today. Heparin restarted post-op (heparin for INR < 1.8). Keep INR 1.4-1.7 range as he will go back to OR next week (08/07).   2.  Chronic systolic CHF: Echo [id number] with EF 20-25%, mildly decreased RV function. LHC/RHC in 12/21 with patent grafts, low output. Suspect mixed ischemic/nonischemic cardiomyopathy (prior heavy ETOH and drugs as well as CAD).  No ETOH, drugs, smoking since CABG in 12/20. Admitted with cardiogenic shock in 12/21, had placement of Impella 5.5 initially, now s/p Heartmate 3 LVAD on 11/17/20.  Ramp echo 2/23 with speed decreased to 5500 rpm. He would eventually like heart transplant. NYHA class II. Have been allowing MAP to run higher due to recent AKI thought to be due to lowering BP too much in setting of right renal artery stenosis. Had Ramp ECHO with speed optimized. Volume status stable. MAPs 90-100s   - INR 1.2. continue heparin gtt  - Continue  Toprol XL 25 mg daily.  - Continue hydralazine 25 mg tid.  - PICC placed for long-term antibiotic use 3. AKI: Creatinine up to 4.6 last admission, initially thought this was ATN from low BP but he was never markedly hypotensive and LFTs without any evidence of shock liver. ? Vanc toxicity (troughs were ok). Taken off Entresto and hydralazine. Volume repleted with IVF. In reviewing prior CTA abdomen/pelvis, he has high-grade stenosis of proximal right renal artery. D/w Renal and feel like he may need higher perfusion pressures. Scr now stable, 1.2-1.4 this admit. MAPs ok 90-100s - Would consider reassessment of renal artery stenosis and possible treatment once creatinine has improved to baseline.  4.  CAD: S/p CABG  12/20.  LHC pre-VAD with patent grafts, no target for intervention. - Continue statin.  5.  Atrial fibrillation: Paroxysmal.  S/p Maze and LA appendage clip with CABG in 12/20. In NSR today  - Continue Toprol XL 25 mg daily.  - Off amiodarone with hyperthyroidism. 6.  Type 2 diabetes: Takes glipizide at home.  - SSI  - Continue Levemir 15 units qHS - Diabetes coordinator following 7. 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  8. 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  He missed his endocrinology appt. Had been rescheduled for July 27th, will need to reschedule again. - He is now off amiodarone.   - Continue methimazole 20 mg bid, TSH was mildly elevated.   9.  Gout: On allopurinol 10. Anemia (chronic blood loss): Got 1u RBCs on 7/12. Hgb 9.3 today   I reviewed the LVAD parameters from today, and compared the results to the patient's prior recorded data.  No programming changes were made.  The LVAD is functioning within specified parameters.  The patient performs LVAD self-test daily.  LVAD interrogation was negative for any significant power changes, alarms or PI events/speed drops.  LVAD equipment check completed and is in good working  order.  Back-up equipment present.   LVAD education done on emergency procedures and precautions and reviewed exit site care.  Length of Stay: [address] AGACNP-BC  07/11/2022 7:03 AM  Patient seen with NP, agree with the above note.   No complaints this morning, INR 1.2 on heparin/warfarin.   General: Well appearing this am. NAD.  HEENT: Normal. Neck: Supple, JVP 7-8 cm. Carotids OK.  Cardiac:  Mechanical heart sounds with LVAD hum present.  Lungs:  CTAB, normal effort.  Abdomen:  NT, ND, no HSM. No bruits or masses. +BS  LVAD exit site: Wound vac Extremities:  Warm and dry. No cyanosis, clubbing, rash, or edema.  Neuro:  Alert & oriented x 3. Cranial nerves grossly intact. Moves all 4 extremities w/o difficulty. Affect pleasant    Continue vancomycin.   Keep INR around 1.7 for return to OR for repeat driveline debridement next week.   Marca Ancona 07/11/2022

## 2022-07-11 NOTE — Plan of Care (Signed)
  Problem: Education: Goal: Knowledge of General Education information will improve Description: Including pain rating scale, medication(s)/side effects and non-pharmacologic comfort measures Outcome: Progressing   Problem: Health Behavior/Discharge Planning: Goal: Ability to manage health-related needs will improve Outcome: Progressing   Problem: Clinical Measurements: Goal: Ability to maintain clinical measurements within normal limits will improve Outcome: Progressing Goal: Will remain free from infection Outcome: Progressing Goal: Diagnostic test results will improve Outcome: Progressing Goal: Respiratory complications will improve Outcome: Progressing Goal: Cardiovascular complication will be avoided Outcome: Progressing   Problem: Nutrition: Goal: Adequate nutrition will be maintained Outcome: Progressing   Problem: Coping: Goal: Level of anxiety will decrease Outcome: Progressing   Problem: Elimination: Goal: Will not experience complications related to bowel motility Outcome: Progressing Goal: Will not experience complications related to urinary retention Outcome: Progressing   Problem: Pain Managment: Goal: General experience of comfort will improve Outcome: Progressing   Problem: Safety: Goal: Ability to remain free from injury will improve Outcome: Progressing   Problem: Skin Integrity: Goal: Risk for impaired skin integrity will decrease Outcome: Progressing   Problem: Education: Goal: Ability to describe self-care measures that may prevent or decrease complications (Diabetes Survival Skills Education) will improve Outcome: Progressing   Problem: Coping: Goal: Ability to adjust to condition or change in health will improve Outcome: Progressing   Problem: Fluid Volume: Goal: Ability to maintain a balanced intake and output will improve Outcome: Progressing   Problem: Health Behavior/Discharge Planning: Goal: Ability to identify and utilize  available resources and services will improve Outcome: Progressing Goal: Ability to manage health-related needs will improve Outcome: Progressing   Problem: Metabolic: Goal: Ability to maintain appropriate glucose levels will improve Outcome: Progressing   Problem: Nutritional: Goal: Maintenance of adequate nutrition will improve Outcome: Progressing Goal: Progress toward achieving an optimal weight will improve Outcome: Progressing   Problem: Skin Integrity: Goal: Risk for impaired skin integrity will decrease Outcome: Progressing   Problem: Tissue Perfusion: Goal: Adequacy of tissue perfusion will improve Outcome: Progressing   Problem: Education: Goal: Ability to demonstrate management of disease process will improve Outcome: Progressing Goal: Ability to verbalize understanding of medication therapies will improve Outcome: Progressing Goal: Individualized Educational Video(s) Outcome: Progressing   Problem: Activity: Goal: Capacity to carry out activities will improve Outcome: Progressing   Problem: Cardiac: Goal: Ability to achieve and maintain adequate cardiopulmonary perfusion will improve Outcome: Progressing   Problem: Education: Goal: Patient will understand all VAD equipment and how it functions Outcome: Progressing Goal: Patient will be able to verbalize current INR target range and antiplatelet therapy for discharge home Outcome: Progressing

## 2022-07-12 ENCOUNTER — Inpatient Hospital Stay (HOSPITAL_COMMUNITY): Payer: 59

## 2022-07-12 LAB — CBC
HCT: 30.2 % — ABNORMAL LOW (ref 39.0–52.0)
Hemoglobin: 10 g/dL — ABNORMAL LOW (ref 13.0–17.0)
MCH: 28.9 pg (ref 26.0–34.0)
MCHC: 33.1 g/dL (ref 30.0–36.0)
MCV: 87.3 fL (ref 80.0–100.0)
Platelets: 192 10*3/uL (ref 150–400)
RBC: 3.46 MIL/uL — ABNORMAL LOW (ref 4.22–5.81)
RDW: 18.7 % — ABNORMAL HIGH (ref 11.5–15.5)
WBC: 7.8 10*3/uL (ref 4.0–10.5)
nRBC: 0 % (ref 0.0–0.2)

## 2022-07-12 LAB — GLUCOSE, CAPILLARY
Glucose-Capillary: 132 mg/dL — ABNORMAL HIGH (ref 70–99)
Glucose-Capillary: 88 mg/dL (ref 70–99)
Glucose-Capillary: 152 mg/dL — ABNORMAL HIGH (ref 70–99)
Glucose-Capillary: 186 mg/dL — ABNORMAL HIGH (ref 70–99)
Glucose-Capillary: 139 mg/dL — ABNORMAL HIGH (ref 70–99)

## 2022-07-12 LAB — BASIC METABOLIC PANEL
Anion gap: 7 (ref 5–15)
BUN: 28 mg/dL — ABNORMAL HIGH (ref 8–23)
CO2: 25 mmol/L (ref 22–32)
Calcium: 9.3 mg/dL (ref 8.9–10.3)
Chloride: 104 mmol/L (ref 98–111)
Creatinine, Ser: 1.2 mg/dL (ref 0.61–1.24)
GFR, Estimated: >60 mL/min (ref >60)
Glucose, Bld: 102 mg/dL — ABNORMAL HIGH (ref 70–99)
Potassium: 3.9 mmol/L (ref 3.5–5.1)
Sodium: 136 mmol/L (ref 135–145)

## 2022-07-12 LAB — HEPARIN LEVEL (UNFRACTIONATED): Heparin Unfractionated: <0.1 IU/mL — ABNORMAL LOW (ref 0.30–0.70)

## 2022-07-12 LAB — PROTIME-INR
INR: 1.1 (ref 0.8–1.2)
Prothrombin Time: 14 seconds (ref 11.4–15.2)

## 2022-07-12 LAB — LACTATE DEHYDROGENASE: LDH: 135 U/L (ref 98–192)

## 2022-07-12 MED ORDER — WARFARIN SODIUM 3 MG PO TABS
6.0000 mg | ORAL_TABLET | Freq: Once | ORAL | Status: AC
  Administered 2022-07-12: 6 mg via ORAL
  Filled 2022-07-12: qty 2

## 2022-07-12 NOTE — Progress Notes (Signed)
ANTICOAGULATION CONSULT NOTE  Pharmacy Consult for warfarin + heparin Indication:  LVAD  No Known Allergies  Patient Measurements: Height: 5\' 10"  (177.8 cm) Weight: 88.9 kg (195 lb 15.8 oz) IBW/kg (Calculated) : 73 Heparin Dosing Weight: 80kg  Vital Signs: Temp: 97.8 F (36.6 C) (08/04 0752) Temp Source: Oral (08/04 0752) BP: 124/94 (08/04 0445) Pulse Rate: 78 (08/04 0752)  Labs: Recent Labs    07/10/22 0410 07/11/22 0330 07/12/22 0505  HGB 9.5* 9.3* 10.0*  HCT 29.3* 28.6* 30.2*  PLT 181 185 192  LABPROT 15.0 14.7 14.0  INR 1.2 1.2 1.1  HEPARINUNFRC <0.10* <0.10* <0.10*  CREATININE 1.20 1.22 1.20     Estimated Creatinine Clearance: 72.6 mL/min (by C-G formula based on SCr of 1.2 mg/dL).   Medical History: Past Medical History:  Diagnosis Date   "    Arthritis    CAD (coronary artery disease)    a. s/p CABG in 11/2019 with LIMA-LAD, SVG-OM1, SVG-PDA and SVG-D1   CHF (congestive heart failure) (HCC)    a. EF < 20% by echo in 11/2019   Essential hypertension    PAF (paroxysmal atrial fibrillation) (HCC)    Type 2 diabetes mellitus (HCC)     Assessment: 73 yoM with HM3 LVAD admitted with ongoing LVAD driveline infection. Pt with planned debridements in OR. Pt is on warfarin PTA.  Pt s/p multiple debridements for wound infection, last 7/31. Per Dr. 8/31 - ok to continue warfarin with INR close to ~2 around debridements.  INR today is subtherapeutic at 1.1, CBC and LDH stable. Heparin level <0.1 as expected. Next debridement 8/8.   *Home warfarin dose 6mg  MWF, 4mg  AODs  Goal of Therapy:  INR goal 2-2.5 Heparin level < 0.3 units/ml Monitor platelets by anticoagulation protocol: Yes   Plan:  Continue heparin 500 units/h  Warfarin 6mg  x1 today  Daily INR, heparin level, CBC, LDH   Donata Clay, PharmD, [place], [facility] Clinical Pharmacist [phone] Please check AMION for all [facility] numbers 07/12/2022

## 2022-07-12 NOTE — Progress Notes (Signed)
LVAD Coordinator Rounding Note:  Admitted 06/12/22 to Dr. Alford Highland service due to drive line debridement scheduled with Dr Donata Clay 06/14/22.   S/p driveline site debridement and wound vac on 7/7.  7/10 Had Ramp ECHO. Speed optimized. 7/13 OR to [facility] dressing change.  Back on Heparin drip.  7/20 OR for debridement and VAC dressing change  Pt lying in bed this morning. States that he didn't rest well because his wound vac alarmed a lot during the night. He states that his stomach is tight and he "feels like I have gas." Had KUB this morning for abdominal distention, negative study.   Wound vac in place. No alarms. Suction at -100. No alarms currently. Apparently suction was decreased earlier this morning due to recurrent blockage alarms.  Transitioned from daptomycin to vancomycin due to elevated CK. Vancomycin planned x 6 weeks; end date 07/18/22. Start Doxycycline 8/11.  Will need to reschedule appt w/ [facility] at discharge.   Vital signs: Temp:  97.8 HR: 77 Doppler Pressure: 104 Automatic BP: 97/77 O2 Sat: 96% on RA Wt: 171.7>174.6>178.6>182.9>181>...189.1>189.1>190.9>192>190.7>195.3>198.5>194>195.9 lbs   LVAD interrogation reveals:  Speed: 5600 Flow:  4.1 Power:  4.2 w PI: 3.7 Alarms: none Events: 20 today; 40 yesterday Hct: 30  Fixed speed:  5600 Low speed limit:  5300  Drive Line:  Wound vac in place. Suction at -100; no leaks noted; draining thin brown cloudy fluid. Anchor secure in drive line.    Labs:  LDH trend: 249>244>208>22>229>199>210>189>163>160>147>157>147>145>169>149>122>143>152>119>124>126>135  INR trend: 1.3 >1.3>1.2>1.1>1.1>1.1>1.1>1.2>1.3>1.5>1.6>1.7>1.7>1.5>1.7>1.8>1.9>1.4>1.2>1.2>1.1  Anticoagulation Plan: -INR Goal: 2.0 - 2.5 -ASA Dose: none  ICD: N/A  Drips:  Heparin - 500 units/hr  Infection:  06/07/22>> repeat drive line culture>> abundant MRSA; final  Blood Products: - 06/21/22>> 2 PRBC   Plan/Recommendations:   Page VAD coordinators for equipment or drive line issues    Carlton Adam RN VAD Coordinator  Office: [phone]  24/7 Pager: [phone]

## 2022-07-12 NOTE — Progress Notes (Addendum)
Patient ID: Kevin Underwood, male   DOB: 07-08-1961, 61 y.o.   MRN: [id number]   Advanced Heart Failure VAD Team Note  PCP-Cardiologist: Nona Dell, MD   Subjective:    S/p driveline site debridement and wound vac on 7/7.  7/10 Had Ramp ECHO. Speed optimized. 7/13 OR to VAC change.  Back on Heparin drip.  7/20 OR for debridement and VAC change 7/31 OR for debriedment and VAC change  Plan for OR again next week 8/7  Scr stable 1.20 today. Doppler MAPs in the 90-100s.   INR 1.1, on heparin gtt.   Feels ok today. Sitting up eating breakfast. Denies CP and SOB.   LVAD INTERROGATION:  HeartMate III LVAD:   Flow 4.4 liters/min, speed 5600, power 4.2 PI 3.1. VAD interrogated personally. Parameters stable.  Objective:    Vital Signs:   Temp:  [97.4 F (36.3 C)-98.4 F (36.9 C)] 97.9 F (36.6 C) (08/04 0445) Pulse Rate:  [74-80] 80 (08/04 0445) Resp:  [13-20] 14 (08/04 0445) BP: (98-130)/(83-99) 124/94 (08/04 0445) SpO2:  [94 %-96 %] 96 % (08/03 1924) Weight:  [88.9 kg] 88.9 kg (08/04 0445) Last BM Date : 07/10/22 Mean arterial Pressure  90-100s  Intake/Output:   Intake/Output Summary (Last 24 hours) at 07/12/2022 0702 Last data filed at 07/12/2022 0400 Gross per 24 hour  Intake 410 ml  Output 1100 ml  Net -690 ml     Physical Exam   Physical Exam: General:  well appearing. Looks stated age. No respiratory difficulty HEENT: normal Neck: supple. JVD ~8 cm. Carotids 2+ bilat; no bruits. No lymphadenopathy or thyromegaly appreciated. Cor: LVAD hum Lungs: clear Abdomen: soft, nontender, nondistended. No hepatosplenomegaly. No bruits or masses. Good bowel sounds. Driveline with wound vac in place. Extremities: no cyanosis, clubbing, rash, edema, PICC RUE Neuro: alert & oriented x 3, cranial nerves grossly intact. moves all 4 extremities w/o difficulty. Affect pleasant.   Telemetry   NSR 80's (Personally reviewed)    Labs   Basic Metabolic Panel: Recent Labs   Lab 07/08/22 0046 07/09/22 0500 07/10/22 0410 07/11/22 0330 07/12/22 0505  NA 137 135 139 139 136  K 4.0 3.5 3.7 3.8 3.9  CL 104 102 104 104 104  CO2 25 26 27 28 25   GLUCOSE 222* 186* 66* 157* 102*  BUN 32* 28* 28* 27* 28*  CREATININE 1.36* 1.20 1.20 1.22 1.20  CALCIUM 8.9 8.6* 9.0 8.9 9.3    Liver Function Tests: No results for input(s): "AST", "ALT", "ALKPHOS", "BILITOT", "PROT", "ALBUMIN" in the last 168 hours.  No results for input(s): "LIPASE", "AMYLASE" in the last 168 hours. No results for input(s): "AMMONIA" in the last 168 hours.  CBC: Recent Labs  Lab 07/08/22 0046 07/09/22 0500 07/10/22 0410 07/11/22 0330 07/12/22 0505  WBC 8.1 7.7 7.3 7.0 7.8  HGB 9.8* 8.6* 9.5* 9.3* 10.0*  HCT 29.6* 26.2* 29.3* 28.6* 30.2*  MCV 85.5 86.8 87.5 88.3 87.3  PLT 189 166 181 185 192    INR: Recent Labs  Lab 07/08/22 0046 07/09/22 0500 07/10/22 0410 07/11/22 0330 07/12/22 0505  INR 1.4* 1.2 1.2 1.2 1.1    Other results:   Imaging   No results found.   Medications:     Scheduled Medications:  allopurinol  100 mg Oral Daily   Chlorhexidine Gluconate Cloth  6 each Topical Daily   docusate sodium  200 mg Oral BID   [START ON 07/19/2022] doxycycline  100 mg Oral Q12H   gabapentin  600  mg Oral BID   guaiFENesin  600 mg Oral BID   hydrALAZINE  25 mg Oral TID   insulin aspart  0-15 Units Subcutaneous TID WC   insulin aspart  0-5 Units Subcutaneous QHS   insulin aspart  3 Units Subcutaneous TID WC   insulin detemir  15 Units Subcutaneous QHS   lactose free nutrition  237 mL Oral TID WC   loratadine  10 mg Oral Daily   methimazole  20 mg Oral BID   metoprolol succinate  25 mg Oral Daily   pantoprazole  40 mg Oral Daily   polyethylene glycol  17 g Oral Daily   rosuvastatin  20 mg Oral Daily   sertraline  50 mg Oral Daily   simethicone  80 mg Oral QID   tamsulosin  0.4 mg Oral Daily   traZODone  150 mg Oral QHS   warfarin  5 mg Oral q1600   Warfarin -  Pharmacist Dosing Inpatient   Does not apply q1600    Infusions:  heparin 500 Units/hr (07/11/22 1900)   vancomycin 1,250 mg (07/11/22 2207)    PRN Medications: acetaminophen, albuterol, morphine injection, ondansetron (ZOFRAN) IV, mouth rinse, [DISCONTINUED] oxyCODONE **OR** oxyCODONE, sorbitol, traMADol   Patient Profile   61 y.o. male with history of HM III LVAD, chronic systolic CHF/ICM, COPD, PAF. 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   Readmitted for driveline infection and surgical debridement/possible wound vac placement.  Assessment/Plan:    1. Driveline infection: Recent driveline infection [id number]. S/p driveline site debridement 6/8. Surgical wound cultures grew MRSA, repeat debridement 6/23. Driveline debridement with wound vac placement on 7/7. 7/13, 7/20 VAC dressing change. Went back to OR again on 7/31 for debridement and VAC change - Transitioned from daptomycin to vancomycin due to elevated CK.  Vancomycin planned x 6 wks, end date 07/18/22.  - INR 1.1 today. Heparin restarted post-op (heparin for INR < 1.8). Keep INR 1.4-1.7 range as he will go back to OR next week (08/07).   2.  Chronic systolic CHF: Echo [id number] with EF 20-25%, mildly decreased RV function. LHC/RHC in 12/21 with patent grafts, low output. Suspect mixed ischemic/nonischemic cardiomyopathy (prior heavy ETOH and drugs as well as CAD).  No ETOH, drugs, smoking since CABG in 12/20. Admitted with cardiogenic shock in 12/21, had placement of Impella 5.5 initially, now s/p Heartmate 3 LVAD on 11/17/20.  Ramp echo 2/23 with speed decreased to 5500 rpm. He would eventually like heart transplant. NYHA class II. Have been allowing MAP to run higher due to recent AKI thought to be due to lowering BP too much in setting of right renal artery stenosis. Had Ramp ECHO with speed optimized. Volume status stable. MAPs 90-100s   - INR 1.1. continue heparin gtt  - Continue Toprol XL 25  mg daily.  - Continue hydralazine 25 mg tid.  - PICC placed for long-term antibiotic use 3. AKI: Creatinine up to 4.6 last admission, initially thought this was ATN from low BP but he was never markedly hypotensive and LFTs without any evidence of shock liver. ? Vanc toxicity (troughs were ok). Taken off Entresto and hydralazine. Volume repleted with IVF. In reviewing prior CTA abdomen/pelvis, he has high-grade stenosis of proximal right renal artery. D/w Renal and feel like he may need higher perfusion pressures. Scr now stable, 1.2-1.4 this admit. MAPs ok 90-100s - Would consider reassessment of renal artery stenosis and possible treatment once creatinine has improved to baseline.  4.  CAD: S/p CABG 12/20.  LHC pre-VAD with patent grafts, no target for intervention. - Continue statin.  5.  Atrial fibrillation: Paroxysmal.  S/p Maze and LA appendage clip with CABG in 12/20. In NSR today  - Continue Toprol XL 25 mg daily.  - Off amiodarone with hyperthyroidism. 6.  Type 2 diabetes: Takes glipizide at home.  - SSI  - Continue Levemir 15 units qHS - Diabetes coordinator following 7. 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  8. 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  He missed his endocrinology appt. Had been rescheduled for July 27th, will need to reschedule again. - He is now off amiodarone.   - Continue methimazole 20 mg bid, TSH was mildly elevated.   9.  Gout: On allopurinol 10. Anemia (chronic blood loss): Got 1u RBCs on 7/12. Hgb 10 today   I reviewed the LVAD parameters from today, and compared the results to the patient's prior recorded data.  No programming changes were made.  The LVAD is functioning within specified parameters.  The patient performs LVAD self-test daily.  LVAD interrogation was negative for any significant power changes, alarms or PI events/speed drops.  LVAD equipment check completed and is in good working order.  Back-up  equipment present.   LVAD education done on emergency procedures and precautions and reviewed exit site care.  Length of Stay: [address], AGACNP-BC  07/12/2022 7:02 AM  Agree with the above NP note.   INR remains 1.1, on heparin gtt/warfarin.  Had KUB for abdominal distention, negative study.   No complaints.   General: Well appearing this am. NAD.  HEENT: Normal. Neck: Supple, JVP 7-8 cm. Carotids OK.  Cardiac:  Mechanical heart sounds with LVAD hum present.  Lungs:  CTAB, normal effort.  Abdomen:  NT, ND, no HSM. No bruits or masses. +BS  LVAD exit site: wound vac Extremities:  Warm and dry. No cyanosis, clubbing, rash, or edema.  Neuro:  Alert & oriented x 3. Cranial nerves grossly intact. Moves all 4 extremities w/o difficulty. Affect pleasant    Continue vancomycin.    Keep INR around 1.7 for return to OR on 8/7 for repeat driveline debridement   Marca Ancona 07/12/2022

## 2022-07-12 NOTE — Progress Notes (Signed)
With order to  may go outside the unit  and sometimes telemetry is not showing.  Instructed not to go farther  because of this issue.

## 2022-07-12 NOTE — Progress Notes (Signed)
Pharmacy Antibiotic Note  Kevin Underwood is a 61 y.o. male admitted on 06/12/2022 with LVAD Driveline infection s/p debridement  7/7 , 7/13 and 7/20.Noted patient was on Daptomycin with CK increased up to 1000, pt transitioned back to vancomycin.   Cr remains stable, next I&D 8/8.  Plan: -Continue vancomycin 1250mg  IV q24h -Planning ABX therapy through 07/18/22   Height: 5\' 10"  (177.8 cm) Weight: 88.9 kg (195 lb 15.8 oz) IBW/kg (Calculated) : 73  Temp (24hrs), Avg:98.1 F (36.7 C), Min:97.8 F (36.6 C), Max:98.4 F (36.9 C)  Recent Labs  Lab 07/08/22 0046 07/08/22 2355 07/09/22 0500 07/09/22 2023 07/10/22 0410 07/11/22 0330 07/12/22 0505  WBC 8.1  --  7.7  --  7.3 7.0 7.8  CREATININE 1.36*  --  1.20  --  1.20 1.22 1.20  VANCOTROUGH  --   --   --  9*  --   --   --   VANCOPEAK  --  25*  --   --   --   --   --      Estimated Creatinine Clearance: 72.6 mL/min (by C-G formula based on SCr of 1.2 mg/dL).    No Known Allergies  09/10/22, PharmD, BCPS, [facility] Clinical Pharmacist [phone] Please check AMION for all [facility] Pharmacy numbers 07/12/2022

## 2022-07-12 NOTE — Progress Notes (Signed)
4 Days Post-Op Procedure(s) (LRB): ABDOMINAL WOUND DEBRIDEMENT (N/A) WOUND VAC CHANGE (N/A) Subjective: Patient feels well Abdominal distention improved, a.m. KUB shows no abnormalities Wound VAC with some alarming this a.m. which stopped by reducing suction to 100 mmHg. Continue current wound care and IV vancomycin Continue to load Coumadin for INR 2.0 Objective: Vital signs in last 24 hours: Temp:  [97.8 F (36.6 C)-98.3 F (36.8 C)] 97.8 F (36.6 C) (08/04 1059) Pulse Rate:  [72-80] 72 (08/04 1202) Cardiac Rhythm: Normal sinus rhythm (08/04 1600) Resp:  [13-20] 20 (08/04 1202) BP: (97-129)/(77-99) 113/88 (08/04 1639) SpO2:  [96 %-97 %] 96 % (08/04 1600) Weight:  [88.9 kg] 88.9 kg (08/04 0445)  Hemodynamic parameters for last 24 hours:    Intake/Output from previous day: 08/03 0701 - 08/04 0700 In: 410 [P.O.:60; I.V.:100; IV Piggyback:250] Out: 1100 [Urine:1050; Drains:50] Intake/Output this shift: Total I/O In: 532 [P.O.:487; I.V.:45] Out: 450 [Urine:400; Drains:50]  Wound VAC sponge compressed Normal VAD hum Abdomen soft with normal bowel sounds  Lab Results: Recent Labs    07/11/22 0330 07/12/22 0505  WBC 7.0 7.8  HGB 9.3* 10.0*  HCT 28.6* 30.2*  PLT 185 192   BMET:  Recent Labs    07/11/22 0330 07/12/22 0505  NA 139 136  K 3.8 3.9  CL 104 104  CO2 28 25  GLUCOSE 157* 102*  BUN 27* 28*  CREATININE 1.22 1.20  CALCIUM 8.9 9.3    PT/INR:  Recent Labs    07/12/22 0505  LABPROT 14.0  INR 1.1   ABG    Component Value Date/Time   PHART 7.508 (H) 11/20/2020 0438   HCO3 24.8 09/09/2021 0313   TCO2 29 11/20/2020 0438   ACIDBASEDEF 6.0 (H) 11/24/2019 1718   O2SAT 63.4 09/09/2021 0313   CBG (last 3)  Recent Labs    07/12/22 0617 07/12/22 1058 07/12/22 1627  GLUCAP 88 152* 186*    Assessment/Plan: S/P Procedure(s) (LRB): ABDOMINAL WOUND DEBRIDEMENT (N/A) WOUND VAC CHANGE (N/A) Plan wound VAC change next week Tuesday p.m.   LOS: 30  days    Lovett Sox 07/12/2022

## 2022-07-13 LAB — BASIC METABOLIC PANEL
Anion gap: 8 (ref 5–15)
BUN: 28 mg/dL — ABNORMAL HIGH (ref 8–23)
CO2: 25 mmol/L (ref 22–32)
Calcium: 9.1 mg/dL (ref 8.9–10.3)
Chloride: 102 mmol/L (ref 98–111)
Creatinine, Ser: 1.2 mg/dL (ref 0.61–1.24)
GFR, Estimated: >60 mL/min (ref >60)
Glucose, Bld: 162 mg/dL — ABNORMAL HIGH (ref 70–99)
Potassium: 3.8 mmol/L (ref 3.5–5.1)
Sodium: 135 mmol/L (ref 135–145)

## 2022-07-13 LAB — GLUCOSE, CAPILLARY
Glucose-Capillary: 115 mg/dL — ABNORMAL HIGH (ref 70–99)
Glucose-Capillary: 204 mg/dL — ABNORMAL HIGH (ref 70–99)
Glucose-Capillary: 86 mg/dL (ref 70–99)
Glucose-Capillary: 246 mg/dL — ABNORMAL HIGH (ref 70–99)

## 2022-07-13 LAB — CBC
HCT: 28.3 % — ABNORMAL LOW (ref 39.0–52.0)
Hemoglobin: 9.1 g/dL — ABNORMAL LOW (ref 13.0–17.0)
MCH: 28.5 pg (ref 26.0–34.0)
MCHC: 32.2 g/dL (ref 30.0–36.0)
MCV: 88.7 fL (ref 80.0–100.0)
Platelets: 185 10*3/uL (ref 150–400)
RBC: 3.19 MIL/uL — ABNORMAL LOW (ref 4.22–5.81)
RDW: 18.6 % — ABNORMAL HIGH (ref 11.5–15.5)
WBC: 8.3 10*3/uL (ref 4.0–10.5)
nRBC: 0 % (ref 0.0–0.2)

## 2022-07-13 LAB — PROTIME-INR
INR: 1.3 — ABNORMAL HIGH (ref 0.8–1.2)
Prothrombin Time: 16.1 seconds — ABNORMAL HIGH (ref 11.4–15.2)

## 2022-07-13 LAB — HEPARIN LEVEL (UNFRACTIONATED): Heparin Unfractionated: <0.1 IU/mL — ABNORMAL LOW (ref 0.30–0.70)

## 2022-07-13 LAB — LACTATE DEHYDROGENASE: LDH: 131 U/L (ref 98–192)

## 2022-07-13 MED ORDER — POTASSIUM CHLORIDE CRYS ER 20 MEQ PO TBCR
20.0000 meq | EXTENDED_RELEASE_TABLET | Freq: Once | ORAL | Status: AC
Start: 2022-07-13 — End: 2022-07-13
  Administered 2022-07-13: 20 meq via ORAL
  Filled 2022-07-13: qty 1

## 2022-07-13 MED ORDER — WARFARIN SODIUM 4 MG PO TABS
4.0000 mg | ORAL_TABLET | Freq: Once | ORAL | Status: AC
  Administered 2022-07-13: 4 mg via ORAL
  Filled 2022-07-13: qty 1

## 2022-07-13 NOTE — Progress Notes (Signed)
[facility]    Date of Admission:  06/12/2022   Total days of antibiotics since 06/12/2022           ID: ASTOR GENTLE is a 61 y.o. male with MRSA drive line infection S/p driveline site debridement and wound vac on 7/7.  7/10 Had Ramp ECHO. Speed optimized. 7/13 OR to VAC change.  Back on Heparin drip.  7/20 OR for debridement and VAC change 7/31 OR for debriedment and VAC change Principal Problem:   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 (LVAD) (HCC)    Subjective: Afebrile, feeling better bust still some abdominal discomfort "new lump" at midline near driveline exit site/wound vac site  Medications:   allopurinol  100 mg Oral Daily   Chlorhexidine Gluconate Cloth  6 each Topical Daily   docusate sodium  200 mg Oral BID   [START ON 07/19/2022] doxycycline  100 mg Oral Q12H   gabapentin  600 mg Oral BID   guaiFENesin  600 mg Oral BID   hydrALAZINE  25 mg Oral TID   insulin aspart  0-15 Units Subcutaneous TID WC   insulin aspart  0-5 Units Subcutaneous QHS   insulin aspart  3 Units Subcutaneous TID WC   insulin detemir  15 Units Subcutaneous QHS   lactose free nutrition  237 mL Oral TID WC   loratadine  10 mg Oral Daily   methimazole  20 mg Oral BID   metoprolol succinate  25 mg Oral Daily   pantoprazole  40 mg Oral Daily   polyethylene glycol  17 g Oral Daily   rosuvastatin  20 mg Oral Daily   sertraline  50 mg Oral Daily   simethicone  80 mg Oral QID   tamsulosin  0.4 mg Oral Daily   traZODone  150 mg Oral QHS   warfarin  4 mg Oral ONCE-1600   Warfarin - Pharmacist Dosing Inpatient   Does not apply q1600    Objective: Vital signs in last 24 hours: Temp:  [97.8 F (36.6 C)-98.5 F (36.9 C)] 97.8 F (36.6 C) (08/05 0710) Pulse Rate:  [71-82] 82 (08/05 0710) Resp:  [15-20] 18 (08/05 0710) BP: (109-146)/(75-129) 118/84 (08/05 0710) SpO2:  [95 %-97 %] 97 % (08/05 0710) Weight:  [89.2 kg] 89.2 kg (08/05  0500) Physical Exam  Constitutional: He is oriented to person, place, and time. He appears well-developed and well-nourished. No distress.  HENT:  Mouth/Throat: Oropharynx is clear and moist. No oropharyngeal exudate.  Cardiovascular: generator hum heard throughout precordium Pulmonary/Chest: Effort normal and breath sounds normal. No respiratory distress. He has no wheezes.  Abdominal: Soft. Bowel sounds are normal. Indurated are at midline "9 oclock" from wound vac/driveline exit site. No erythema, firm, no fluctuance. Lymphadenopathy:  He has no cervical adenopathy.  Neurological: He is alert and oriented to person, place, and time.  Skin: Skin is warm and dry. No rash noted. No erythema.  Psychiatric: He has a normal mood and affect. His behavior is normal.    Lab Results Recent Labs    07/12/22 0505 07/13/22 0335  WBC 7.8 8.3  HGB 10.0* 9.1*  HCT 30.2* 28.3*  NA 136 135  K 3.9 3.8  CL 104 102  CO2 25 25  BUN 28* 28*  CREATININE 1.20 1.20     Microbiology: Methicillin resistant staphylococcus aureus      MIC    CIPROFLOXACIN >=8 RESISTANT  Resistant    CLINDAMYCIN <=0.25 SENS.Marland KitchenMarland Kitchen  Sensitive    ERYTHROMYCIN >=8 RESISTANT  Resistant    GENTAMICIN <=0.5 SENSI... Sensitive    Inducible Clindamycin NEGATIVE  Sensitive    OXACILLIN >=4 RESISTANT  Resistant    RIFAMPIN <=0.5 SENSI... Sensitive    TETRACYCLINE <=1 SENSITIVE  Sensitive    TRIMETH/SULFA >=320 RESIS... Resistant    VANCOMYCIN 1 SENSITIVE  Sensitive    Studies/Results: DG Chest Port 1 View  Result Date: 07/12/2022 CLINICAL DATA:  Left ventricular assist device. EXAM: PORTABLE CHEST 1 VIEW COMPARISON:  June 30, 2022. FINDINGS: Stable position of left ventricular assist device. Right-sided PICC line is unchanged. Sternotomy wires are noted. No acute pulmonary disease is noted. Old left clavicular fracture is noted. IMPRESSION: Stable position of left ventricular cyst device. No acute cardiopulmonary abnormality  seen. Electronically Signed   By: Lupita Raider M.D.   On: 07/12/2022 08:12   DG Abd Portable 1V  Result Date: 07/12/2022 CLINICAL DATA:  Right upper quadrant abdominal pain. EXAM: PORTABLE ABDOMEN - 1 VIEW COMPARISON:  November 24, 2019. FINDINGS: The bowel gas pattern is normal. No radio-opaque calculi or other significant radiographic abnormality are seen. IMPRESSION: Negative. Electronically Signed   By: Lupita Raider M.D.   On: 07/12/2022 08:11     Assessment/Plan: MRSA drive line infection = concern that new indurated area could represent residual infection. He is to undergo repeat I x D and wound vac change on 8/8 to evaluate area. For the time being the plan is to continue on IV vancomycin as his kidney function tolerates.   Please repeat tissue culture at next debridement as this may extend his course of abtx therapy.   Will need long term abtx suppression. Currently his isolate is still sensitive to tetracycline. May also consider dosing of dalbavancin as an alternative medication.  Therapeutic drug monitoring = cr is stable while on vancomycin  [facility] Pager: [phone]  07/13/2022, 11:27 AM

## 2022-07-13 NOTE — Progress Notes (Signed)
ANTICOAGULATION CONSULT NOTE  Pharmacy Consult for warfarin + heparin Indication:  LVAD  No Known Allergies  Patient Measurements: Height: 5\' 10"  (177.8 cm) Weight: 89.2 kg (196 lb 10.4 oz) IBW/kg (Calculated) : 73 Heparin Dosing Weight: 80kg  Vital Signs: Temp: 97.8 F (36.6 C) (08/05 0309) Temp Source: Oral (08/05 0309) BP: 146/129 (08/05 0309) Pulse Rate: 78 (08/05 0309)  Labs: Recent Labs    07/11/22 0330 07/12/22 0505 07/13/22 0335  HGB 9.3* 10.0* 9.1*  HCT 28.6* 30.2* 28.3*  PLT 185 192 185  LABPROT 14.7 14.0 16.1*  INR 1.2 1.1 1.3*  HEPARINUNFRC <0.10* <0.10* <0.10*  CREATININE 1.22 1.20 1.20     Estimated Creatinine Clearance: 72.7 mL/min (by C-G formula based on SCr of 1.2 mg/dL).   Medical History: Past Medical History:  Diagnosis Date   "    Arthritis    CAD (coronary artery disease)    a. s/p CABG in 11/2019 with LIMA-LAD, SVG-OM1, SVG-PDA and SVG-D1   CHF (congestive heart failure) (HCC)    a. EF < 20% by echo in 11/2019   Essential hypertension    PAF (paroxysmal atrial fibrillation) (HCC)    Type 2 diabetes mellitus (HCC)     Assessment: 89 yoM with HM3 LVAD admitted with ongoing LVAD driveline infection. Pt with planned debridements in OR. Pt is on warfarin PTA.  Pt s/p multiple debridements for wound infection, last 7/31. Per Dr. 8/31 - ok to continue warfarin with INR close to ~2 around debridements.  INR today is subtherapeutic at 1.3, CBC and LDH stable. Heparin level <0.1 as expected. Next debridement 8/8.   *Home warfarin dose 6mg  MWF, 4mg  AODs  Goal of Therapy:  INR goal 2-2.5 Heparin level < 0.3 units/ml Monitor platelets by anticoagulation protocol: Yes   Plan:  Continue heparin 500 units/h  Warfarin 4mg  x1 today  Daily INR, heparin level, CBC, LDH  Thank you for allowing pharmacy to participate in this patient's care.  Donata Clay, PharmD PGY2 Pharmacy Resident 07/13/2022 7:07 AM Check [email or web address] for unit  specific pharmacy number

## 2022-07-13 NOTE — Plan of Care (Signed)
  Problem: Education: Goal: Knowledge of General Education information will improve Description: Including pain rating scale, medication(s)/side effects and non-pharmacologic comfort measures Outcome: Progressing   Problem: Health Behavior/Discharge Planning: Goal: Ability to manage health-related needs will improve Outcome: Progressing   Problem: Clinical Measurements: Goal: Ability to maintain clinical measurements within normal limits will improve Outcome: Progressing Goal: Will remain free from infection Outcome: Progressing Goal: Diagnostic test results will improve Outcome: Progressing Goal: Respiratory complications will improve Outcome: Progressing Goal: Cardiovascular complication will be avoided Outcome: Progressing   Problem: Activity: Goal: Risk for activity intolerance will decrease Outcome: Progressing   Problem: Nutrition: Goal: Adequate nutrition will be maintained Outcome: Progressing   Problem: Coping: Goal: Level of anxiety will decrease Outcome: Progressing   Problem: Elimination: Goal: Will not experience complications related to bowel motility Outcome: Progressing Goal: Will not experience complications related to urinary retention Outcome: Progressing   Problem: Pain Managment: Goal: General experience of comfort will improve Outcome: Progressing   Problem: Safety: Goal: Ability to remain free from injury will improve Outcome: Progressing   Problem: Skin Integrity: Goal: Risk for impaired skin integrity will decrease Outcome: Progressing   Problem: Education: Goal: Ability to describe self-care measures that may prevent or decrease complications (Diabetes Survival Skills Education) will improve Outcome: Progressing Goal: Individualized Educational Video(s) Outcome: Progressing   Problem: Coping: Goal: Ability to adjust to condition or change in health will improve Outcome: Progressing   Problem: Fluid Volume: Goal: Ability to  maintain a balanced intake and output will improve Outcome: Progressing   Problem: Health Behavior/Discharge Planning: Goal: Ability to identify and utilize available resources and services will improve Outcome: Progressing Goal: Ability to manage health-related needs will improve Outcome: Progressing   Problem: Metabolic: Goal: Ability to maintain appropriate glucose levels will improve Outcome: Progressing   Problem: Nutritional: Goal: Maintenance of adequate nutrition will improve Outcome: Progressing Goal: Progress toward achieving an optimal weight will improve Outcome: Progressing   Problem: Skin Integrity: Goal: Risk for impaired skin integrity will decrease Outcome: Progressing   Problem: Tissue Perfusion: Goal: Adequacy of tissue perfusion will improve Outcome: Progressing   Problem: Education: Goal: Ability to demonstrate management of disease process will improve Outcome: Progressing Goal: Ability to verbalize understanding of medication therapies will improve Outcome: Progressing Goal: Individualized Educational Video(s) Outcome: Progressing   Problem: Activity: Goal: Capacity to carry out activities will improve Outcome: Progressing   Problem: Cardiac: Goal: Ability to achieve and maintain adequate cardiopulmonary perfusion will improve Outcome: Progressing   Problem: Education: Goal: Patient will understand all VAD equipment and how it functions Outcome: Progressing Goal: Patient will be able to verbalize current INR target range and antiplatelet therapy for discharge home Outcome: Progressing   Problem: Cardiac: Goal: LVAD will function as expected and patient will experience no clinical alarms Outcome: Progressing   

## 2022-07-13 NOTE — Progress Notes (Signed)
Patient ID: Kevin Underwood, male   DOB: May 16, 1961, 61 y.o.   MRN: [id number] Patient ID: Kevin Underwood, male   DOB: 05/24/1961, 61 y.o.   MRN: [id number]   Advanced Heart Failure VAD Team Note  PCP-Cardiologist: Nona Dell, MD   Subjective:    S/p driveline site debridement and wound vac on 7/7.  7/10 Had Ramp ECHO. Speed optimized. 7/13 OR to VAC change.  Back on Heparin drip.  7/20 OR for debridement and VAC change 7/31 OR for debriedment and VAC change  Plan for OR again next week 8/7  Scr stable 1.20 today. Doppler MAPs in the 90-100s.   INR 1.1, on heparin gtt.   Feels ok today. Sitting up eating breakfast. Denies CP and SOB.   LVAD INTERROGATION:  HeartMate III LVAD:   Flow 4.4 liters/min, speed 5600, power 4.2 PI 3.1. VAD interrogated personally. Parameters stable.  Objective:    Vital Signs:   Temp:  [97.8 F (36.6 C)-98.5 F (36.9 C)] 97.8 F (36.6 C) (08/05 0710) Pulse Rate:  [71-82] 82 (08/05 0710) Resp:  [15-20] 18 (08/05 0710) BP: (97-146)/(75-129) 118/84 (08/05 0710) SpO2:  [95 %-97 %] 97 % (08/05 0710) Weight:  [89.2 kg] 89.2 kg (08/05 0500) Last BM Date : 07/11/22 Mean arterial Pressure  90-100s  Intake/Output:   Intake/Output Summary (Last 24 hours) at 07/13/2022 1050 Last data filed at 07/13/2022 0800 Gross per 24 hour  Intake 1335.59 ml  Output 470 ml  Net 865.59 ml     Physical Exam   Physical Exam: General:  well appearing. Looks stated age. No respiratory difficulty HEENT: normal Neck: supple. JVD ~8 cm. Carotids 2+ bilat; no bruits. No lymphadenopathy or thyromegaly appreciated. Cor: LVAD hum Lungs: clear Abdomen: soft, nontender, nondistended. No hepatosplenomegaly. No bruits or masses. Good bowel sounds. Driveline with wound vac in place. Extremities: no cyanosis, clubbing, rash, edema, PICC RUE Neuro: alert & oriented x 3, cranial nerves grossly intact. moves all 4 extremities w/o difficulty. Affect pleasant.   Telemetry    NSR 80's (Personally reviewed)    Labs   Basic Metabolic Panel: Recent Labs  Lab 07/09/22 0500 07/10/22 0410 07/11/22 0330 07/12/22 0505 07/13/22 0335  NA 135 139 139 136 135  K 3.5 3.7 3.8 3.9 3.8  CL 102 104 104 104 102  CO2 26 27 28 25 25   GLUCOSE 186* 66* 157* 102* 162*  BUN 28* 28* 27* 28* 28*  CREATININE 1.20 1.20 1.22 1.20 1.20  CALCIUM 8.6* 9.0 8.9 9.3 9.1    Liver Function Tests: No results for input(s): "AST", "ALT", "ALKPHOS", "BILITOT", "PROT", "ALBUMIN" in the last 168 hours.  No results for input(s): "LIPASE", "AMYLASE" in the last 168 hours. No results for input(s): "AMMONIA" in the last 168 hours.  CBC: Recent Labs  Lab 07/09/22 0500 07/10/22 0410 07/11/22 0330 07/12/22 0505 07/13/22 0335  WBC 7.7 7.3 7.0 7.8 8.3  HGB 8.6* 9.5* 9.3* 10.0* 9.1*  HCT 26.2* 29.3* 28.6* 30.2* 28.3*  MCV 86.8 87.5 88.3 87.3 88.7  PLT 166 181 185 192 185    INR: Recent Labs  Lab 07/09/22 0500 07/10/22 0410 07/11/22 0330 07/12/22 0505 07/13/22 0335  INR 1.2 1.2 1.2 1.1 1.3*    Other results:   Imaging   DG Chest Port 1 View  Result Date: 07/12/2022 CLINICAL DATA:  Left ventricular assist device. EXAM: PORTABLE CHEST 1 VIEW COMPARISON:  June 30, 2022. FINDINGS: Stable position of left ventricular assist device. Right-sided PICC line is  unchanged. Sternotomy wires are noted. No acute pulmonary disease is noted. Old left clavicular fracture is noted. IMPRESSION: Stable position of left ventricular cyst device. No acute cardiopulmonary abnormality seen. Electronically Signed   By: Marijo Conception M.D.   On: 07/12/2022 08:12   DG Abd Portable 1V  Result Date: 07/12/2022 CLINICAL DATA:  Right upper quadrant abdominal pain. EXAM: PORTABLE ABDOMEN - 1 VIEW COMPARISON:  November 24, 2019. FINDINGS: The bowel gas pattern is normal. No radio-opaque calculi or other significant radiographic abnormality are seen. IMPRESSION: Negative. Electronically Signed   By: Marijo Conception M.D.   On: 07/12/2022 08:11     Medications:     Scheduled Medications:  allopurinol  100 mg Oral Daily   Chlorhexidine Gluconate Cloth  6 each Topical Daily   docusate sodium  200 mg Oral BID   [START ON 07/19/2022] doxycycline  100 mg Oral Q12H   gabapentin  600 mg Oral BID   guaiFENesin  600 mg Oral BID   hydrALAZINE  25 mg Oral TID   insulin aspart  0-15 Units Subcutaneous TID WC   insulin aspart  0-5 Units Subcutaneous QHS   insulin aspart  3 Units Subcutaneous TID WC   insulin detemir  15 Units Subcutaneous QHS   lactose free nutrition  237 mL Oral TID WC   loratadine  10 mg Oral Daily   methimazole  20 mg Oral BID   metoprolol succinate  25 mg Oral Daily   pantoprazole  40 mg Oral Daily   polyethylene glycol  17 g Oral Daily   rosuvastatin  20 mg Oral Daily   sertraline  50 mg Oral Daily   simethicone  80 mg Oral QID   tamsulosin  0.4 mg Oral Daily   traZODone  150 mg Oral QHS   warfarin  4 mg Oral ONCE-1600   Warfarin - Pharmacist Dosing Inpatient   Does not apply q1600    Infusions:  heparin 500 Units/hr (07/13/22 0710)   vancomycin 1,250 mg (07/12/22 2126)    PRN Medications: acetaminophen, albuterol, morphine injection, ondansetron (ZOFRAN) IV, mouth rinse, [DISCONTINUED] oxyCODONE **OR** oxyCODONE, sorbitol, traMADol   Patient Profile   61 y.o. male with history of HM III LVAD, chronic systolic CHF/ICM, COPD, PAF. 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   Readmitted for driveline infection and surgical debridement/possible wound vac placement.  Assessment/Plan:    1. Driveline infection: Recent driveline infection [id number]. S/p driveline site debridement 6/8. Surgical wound cultures grew MRSA, repeat debridement 6/23. Driveline debridement with wound vac placement on 7/7. 7/13, 7/20 VAC dressing change. Went back to OR again on 7/31 for debridement and VAC change - Transitioned from daptomycin to vancomycin  due to elevated CK.  Vancomycin planned x 6 wks, end date 07/18/22.  - INR 1.3 today. Heparin restarted post-op (heparin for INR < 1.8). Keep INR 1.8-2 range as he will go back to OR next week (08/07).   2.  Chronic systolic CHF: Echo [id number] with EF 20-25%, mildly decreased RV function. LHC/RHC in 12/21 with patent grafts, low output. Suspect mixed ischemic/nonischemic cardiomyopathy (prior heavy ETOH and drugs as well as CAD).  No ETOH, drugs, smoking since CABG in 12/20. Admitted with cardiogenic shock in 12/21, had placement of Impella 5.5 initially, now s/p Heartmate 3 LVAD on 11/17/20.  Ramp echo 2/23 with speed decreased to 5500 rpm. He would eventually like heart transplant. NYHA class II. Have been allowing MAP  to run higher due to recent AKI thought to be due to lowering BP too much in setting of right renal artery stenosis. Had Ramp ECHO with speed optimized. Volume status stable. MAPs 90-100s   - INR 1.3. continue heparin gtt/warfarin.  - Continue Toprol XL 25 mg daily.  - Continue hydralazine 25 mg tid.  - PICC placed for long-term antibiotic use 3. AKI: Creatinine up to 4.6 last admission, initially thought this was ATN from low BP but he was never markedly hypotensive and LFTs without any evidence of shock liver. ? Vanc toxicity (troughs were ok). Taken off Entresto and hydralazine. Volume repleted with IVF. In reviewing prior CTA abdomen/pelvis, he has high-grade stenosis of proximal right renal artery. D/w Renal and feel like he may need higher perfusion pressures. Scr now stable, 1.2-1.4 this admit. MAPs ok generally 90s.  - Would consider reassessment of renal artery stenosis and possible treatment once creatinine has improved to baseline.  4.  CAD: S/p CABG 12/20.  LHC pre-VAD with patent grafts, no target for intervention. - Continue statin.  5.  Atrial fibrillation: Paroxysmal.  S/p Maze and LA appendage clip with CABG in 12/20. In NSR today  - Continue Toprol XL 25 mg daily.  - Off  amiodarone with hyperthyroidism. 6.  Type 2 diabetes: Takes glipizide at home.  - SSI  - Continue Levemir 15 units qHS - Diabetes coordinator following 7. 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  8. 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  He missed his endocrinology appt. Had been rescheduled for July 27th, will need to reschedule again. - He is now off amiodarone.   - Continue methimazole 20 mg bid, TSH was mildly elevated.   9.  Gout: On allopurinol 10. Anemia (chronic blood loss): Got 1u RBCs on 7/12. Hgb 9.1 today   I reviewed the LVAD parameters from today, and compared the results to the patient's prior recorded data.  No programming changes were made.  The LVAD is functioning within specified parameters.  The patient performs LVAD self-test daily.  LVAD interrogation was negative for any significant power changes, alarms or PI events/speed drops.  LVAD equipment check completed and is in good working order.  Back-up equipment present.   LVAD education done on emergency procedures and precautions and reviewed exit site care.  Length of Stay: [address] 07/13/2022

## 2022-07-14 LAB — BASIC METABOLIC PANEL
Anion gap: 7 (ref 5–15)
BUN: 26 mg/dL — ABNORMAL HIGH (ref 8–23)
CO2: 23 mmol/L (ref 22–32)
Calcium: 8.8 mg/dL — ABNORMAL LOW (ref 8.9–10.3)
Chloride: 107 mmol/L (ref 98–111)
Creatinine, Ser: 1.16 mg/dL (ref 0.61–1.24)
GFR, Estimated: >60 mL/min (ref >60)
Glucose, Bld: 132 mg/dL — ABNORMAL HIGH (ref 70–99)
Potassium: 4 mmol/L (ref 3.5–5.1)
Sodium: 137 mmol/L (ref 135–145)

## 2022-07-14 LAB — GLUCOSE, CAPILLARY
Glucose-Capillary: 127 mg/dL — ABNORMAL HIGH (ref 70–99)
Glucose-Capillary: 96 mg/dL (ref 70–99)
Glucose-Capillary: 142 mg/dL — ABNORMAL HIGH (ref 70–99)
Glucose-Capillary: 169 mg/dL — ABNORMAL HIGH (ref 70–99)

## 2022-07-14 LAB — PROTIME-INR
INR: 1.4 — ABNORMAL HIGH (ref 0.8–1.2)
Prothrombin Time: 16.8 seconds — ABNORMAL HIGH (ref 11.4–15.2)

## 2022-07-14 LAB — LACTATE DEHYDROGENASE: LDH: 118 U/L (ref 98–192)

## 2022-07-14 LAB — HEPARIN LEVEL (UNFRACTIONATED): Heparin Unfractionated: <0.1 IU/mL — ABNORMAL LOW (ref 0.30–0.70)

## 2022-07-14 MED ORDER — WARFARIN SODIUM 3 MG PO TABS
6.0000 mg | ORAL_TABLET | Freq: Once | ORAL | Status: AC
  Administered 2022-07-14: 6 mg via ORAL
  Filled 2022-07-14: qty 2

## 2022-07-14 NOTE — Progress Notes (Signed)
Pt wound vac dressing removed and changed to a wet-to-dry dressing per LVAD coordinator Sarah.   This RN removed 1 black foam and 1 white foam. Skin around the site was wiped with ample CHG prep swabs. Sterile waster soaked kerlex was packed into the wound, dry 4x4s placed on tops and secured with ample tape and 2 new driveline anchors.

## 2022-07-14 NOTE — Progress Notes (Signed)
Patient ID: Kevin Underwood, male   DOB: [birth year], 61 y.o.   MRN: [id number]  Advanced Heart Failure VAD Team Note  PCP-Cardiologist: Rozann Lesches, MD   Subjective:    S/p driveline site debridement and wound vac on 7/7.  7/10 Had Ramp ECHO. Speed optimized. 7/13 OR to VAC change.  Back on Heparin drip.  7/20 OR for debridement and VAC change 7/31 OR for debriedment and VAC change  Plan for OR again next week 8/8  Scr stable 1.16 today. Doppler MAPs in the 90s   INR 1.4, on heparin gtt/warfarin.   Small "knot" has come up adjacent to driveline exit site.   LVAD INTERROGATION:  HeartMate III LVAD:   Flow 4.4 liters/min, speed 5600, power 4 PI 3.1. VAD interrogated personally. Parameters stable.  Objective:    Vital Signs:   Temp:  [97.6 F (36.4 C)-98.4 F (36.9 C)] 97.6 F (36.4 C) (08/06 0818) Pulse Rate:  [75-93] 76 (08/06 0818) Resp:  [15-20] 20 (08/06 0818) BP: (94-125)/(74-103) 116/94 (08/06 0818) SpO2:  [95 %-98 %] 95 % (08/06 0010) Weight:  [88.1 kg] 88.1 kg (08/06 0449) Last BM Date : 07/11/22 Mean arterial Pressure  90s Intake/Output:   Intake/Output Summary (Last 24 hours) at 07/14/2022 0938 Last data filed at 07/14/2022 0008 Gross per 24 hour  Intake 532.63 ml  Output 550 ml  Net -17.37 ml     Physical Exam   General: Well appearing this am. NAD.  HEENT: Normal. Neck: Supple, JVP 7-8 cm. Carotids OK.  Cardiac:  Mechanical heart sounds with LVAD hum present.  Lungs:  CTAB, normal effort.  Abdomen:  NT, ND, no HSM. No bruits or masses. +BS  LVAD exit site: Wound vac present Extremities:  Warm and dry. No cyanosis, clubbing, rash, or edema.  Neuro:  Alert & oriented x 3. Cranial nerves grossly intact. Moves all 4 extremities w/o difficulty. Affect pleasant    Telemetry   NSR 80's (Personally reviewed)    Labs   Basic Metabolic Panel: Recent Labs  Lab 07/10/22 0410 07/11/22 0330 07/12/22 0505 07/13/22 0335 07/14/22 0052  NA 139 139  136 135 137  K 3.7 3.8 3.9 3.8 4.0  CL 104 104 104 102 107  CO2 27 28 25 25 23   GLUCOSE 66* 157* 102* 162* 132*  BUN 28* 27* 28* 28* 26*  CREATININE 1.20 1.22 1.20 1.20 1.16  CALCIUM 9.0 8.9 9.3 9.1 8.8*    Liver Function Tests: No results for input(s): "AST", "ALT", "ALKPHOS", "BILITOT", "PROT", "ALBUMIN" in the last 168 hours.  No results for input(s): "LIPASE", "AMYLASE" in the last 168 hours. No results for input(s): "AMMONIA" in the last 168 hours.  CBC: Recent Labs  Lab 07/09/22 0500 07/10/22 0410 07/11/22 0330 07/12/22 0505 07/13/22 0335  WBC 7.7 7.3 7.0 7.8 8.3  HGB 8.6* 9.5* 9.3* 10.0* 9.1*  HCT 26.2* 29.3* 28.6* 30.2* 28.3*  MCV 86.8 87.5 88.3 87.3 88.7  PLT 166 181 185 192 185    INR: Recent Labs  Lab 07/10/22 0410 07/11/22 0330 07/12/22 0505 07/13/22 0335 07/14/22 0052  INR 1.2 1.2 1.1 1.3* 1.4*    Other results:   Imaging   No results found.   Medications:     Scheduled Medications:  allopurinol  100 mg Oral Daily   Chlorhexidine Gluconate Cloth  6 each Topical Daily   docusate sodium  200 mg Oral BID   [START ON 07/19/2022] doxycycline  100 mg Oral Q12H   gabapentin  600 mg Oral BID   guaiFENesin  600 mg Oral BID   hydrALAZINE  25 mg Oral TID   insulin aspart  0-15 Units Subcutaneous TID WC   insulin aspart  0-5 Units Subcutaneous QHS   insulin aspart  3 Units Subcutaneous TID WC   insulin detemir  15 Units Subcutaneous QHS   lactose free nutrition  237 mL Oral TID WC   loratadine  10 mg Oral Daily   methimazole  20 mg Oral BID   metoprolol succinate  25 mg Oral Daily   pantoprazole  40 mg Oral Daily   polyethylene glycol  17 g Oral Daily   rosuvastatin  20 mg Oral Daily   sertraline  50 mg Oral Daily   simethicone  80 mg Oral QID   tamsulosin  0.4 mg Oral Daily   traZODone  150 mg Oral QHS   Warfarin - Pharmacist Dosing Inpatient   Does not apply q1600    Infusions:  heparin 500 Units/hr (07/13/22 2046)   vancomycin 1,250  mg (07/13/22 2238)    PRN Medications: acetaminophen, albuterol, morphine injection, ondansetron (ZOFRAN) IV, mouth rinse, [DISCONTINUED] oxyCODONE **OR** oxyCODONE, sorbitol, traMADol   Patient Profile   61 y.o. male with history of HM III LVAD, chronic systolic CHF/ICM, COPD, PAF. 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   Readmitted for driveline infection and surgical debridement/possible wound vac placement.  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 S/p driveline site debridement 6/8. Surgical wound cultures grew MRSA, repeat debridement 6/23. Driveline debridement with wound vac placement on 7/7. 7/13, 7/20 VAC dressing change. Went back to OR again on 7/31 for debridement and VAC change.  Now with small "knot" adjacent to driveline site.  - Transitioned from daptomycin to vancomycin due to elevated CK.  Vancomycin planned x 6 wks, end date 07/18/22.  - INR 1.4 today. Heparin restarted post-op (heparin for INR < 1.8). Keep INR 1.8-2 range as he will go back to OR next week (08/07).   2.  Chronic systolic CHF: Echo 10/21 with EF 20-25%, mildly decreased RV function. LHC/RHC in 12/21 with patent grafts, low output. Suspect mixed ischemic/nonischemic cardiomyopathy (prior heavy ETOH and drugs as well as CAD).  No ETOH, drugs, smoking since CABG in 12/20. Admitted with cardiogenic shock in 12/21, had placement of Impella 5.5 initially, now s/p Heartmate 3 LVAD on 11/17/20.  Ramp echo 2/23 with speed decreased to 5500 rpm. He would eventually like heart transplant. NYHA class II. Have been allowing MAP to run higher due to recent AKI thought to be due to lowering BP too much in setting of right renal artery stenosis. Had Ramp ECHO with speed optimized. Volume status stable. MAPs 90s.   - INR 1.4. continue heparin gtt/warfarin.  - Continue Toprol XL 25 mg daily.  - Continue hydralazine 25 mg tid.  - PICC placed for  long-term antibiotic use 3. AKI: Creatinine up to 4.6 last admission, initially thought this was ATN from low BP but he was never markedly hypotensive and LFTs without any evidence of shock liver. ? Vanc toxicity (troughs were ok). Taken off Entresto and hydralazine. Volume repleted with IVF. In reviewing prior CTA abdomen/pelvis, he has high-grade stenosis of proximal right renal artery. D/w Renal and feel like he may need higher perfusion pressures. Scr now stable, 1.2-1.4 this admit. MAPs ok generally 90s.  - Would consider reassessment of renal artery stenosis and possible treatment once  creatinine has improved to baseline.  4.  CAD: S/p CABG 12/20.  LHC pre-VAD with patent grafts, no target for intervention. - Continue statin.  5.  Atrial fibrillation: Paroxysmal.  S/p Maze and LA appendage clip with CABG in 12/20. In NSR today  - Continue Toprol XL 25 mg daily.  - Off amiodarone with hyperthyroidism. 6.  Type 2 diabetes: Takes glipizide at home.  - SSI  - Continue Levemir 15 units qHS - Diabetes coordinator following 7. 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  8. 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  He missed his endocrinology appt. Had been rescheduled for July 27th, will need to reschedule again. - He is now off amiodarone.   - Continue methimazole 20 mg bid, TSH was mildly elevated.   9.  Gout: On allopurinol 10. Anemia (chronic blood loss): Got 1u RBCs on 7/12.   I reviewed the LVAD parameters from today, and compared the results to the patient's prior recorded data.  No programming changes were made.  The LVAD is functioning within specified parameters.  The patient performs LVAD self-test daily.  LVAD interrogation was negative for any significant power changes, alarms or PI events/speed drops.  LVAD equipment check completed and is in good working order.  Back-up equipment present.   LVAD education done on emergency procedures and  precautions and reviewed exit site care.  Length of Stay: [address] 07/14/2022

## 2022-07-14 NOTE — Progress Notes (Signed)
RN called to room by patient. Pt endorses that tubing for wound vac "pulled out". Assessment of site reveals adhesive covers still intact, but doughnut and tubing had been torn from adhesive. RN sterilized site with CHG prep swab and placed more adhesive would vac material to seal in the site.   LVAD Coordinator Sarah paged.   RN advised to removed wound vac bandage and to perform a wet to dry dressing until assessment by LVAD team in morning.

## 2022-07-14 NOTE — Progress Notes (Signed)
ANTICOAGULATION CONSULT NOTE  Pharmacy Consult for warfarin + heparin Indication:  LVAD  No Known Allergies  Patient Measurements: Height: 5\' 10"  (177.8 cm) Weight: 88.1 kg (194 lb 3.2 oz) IBW/kg (Calculated) : 73 Heparin Dosing Weight: 80kg  Vital Signs: Temp: 99.2 F (37.3 C) (08/06 1207) Temp Source: Oral (08/06 1207) BP: 99/82 (08/06 1207) Pulse Rate: 84 (08/06 0955)  Labs: Recent Labs    07/12/22 0505 07/13/22 0335 07/14/22 0052  HGB 10.0* 9.1*  --   HCT 30.2* 28.3*  --   PLT 192 185  --   LABPROT 14.0 16.1* 16.8*  INR 1.1 1.3* 1.4*  HEPARINUNFRC <0.10* <0.10* <0.10*  CREATININE 1.20 1.20 1.16     Estimated Creatinine Clearance: 74.7 mL/min (by C-G formula based on SCr of 1.16 mg/dL).   Medical History: Past Medical History:  Diagnosis Date   "    Arthritis    CAD (coronary artery disease)    a. s/p CABG in 11/2019 with LIMA-LAD, SVG-OM1, SVG-PDA and SVG-D1   CHF (congestive heart failure) (HCC)    a. EF < 20% by echo in 11/2019   Essential hypertension    PAF (paroxysmal atrial fibrillation) (HCC)    Type 2 diabetes mellitus (HCC)     Assessment: 41 yoM with HM3 LVAD admitted with ongoing LVAD driveline infection. Pt with planned debridements in OR. Pt is on warfarin PTA.  Pt s/p multiple debridements for wound infection, last 7/31. Per Dr. 8/31 - ok to continue warfarin with INR close to ~2 around debridements.  INR today is subtherapeutic at 1.4, CBC and LDH stable. Heparin level <0.1 as expected. Next debridement 8/8.  *Home warfarin dose 6mg  MWF, 4mg  AODs  Goal of Therapy:  INR goal 2-2.5 Heparin level < 0.3 units/ml Monitor platelets by anticoagulation protocol: Yes   Plan:  Continue heparin 500 units/h  Give larger warfarin dose of  6 mg x1 today  Daily INR, heparin level, CBC, LDH  Thank you for allowing pharmacy to participate in this patient's care.  Donata Clay, PharmD PGY2 Pharmacy Resident 07/14/2022 3:17 PM Check  [email or web address] for unit specific pharmacy number

## 2022-07-14 NOTE — Plan of Care (Signed)
  Problem: Education: Goal: Knowledge of General Education information will improve Description: Including pain rating scale, medication(s)/side effects and non-pharmacologic comfort measures Outcome: Progressing   Problem: Health Behavior/Discharge Planning: Goal: Ability to manage health-related needs will improve Outcome: Progressing   Problem: Clinical Measurements: Goal: Ability to maintain clinical measurements within normal limits will improve Outcome: Progressing Goal: Respiratory complications will improve Outcome: Progressing Goal: Cardiovascular complication will be avoided Outcome: Progressing   Problem: Activity: Goal: Risk for activity intolerance will decrease Outcome: Progressing   Problem: Nutrition: Goal: Adequate nutrition will be maintained Outcome: Progressing   Problem: Coping: Goal: Level of anxiety will decrease Outcome: Progressing   Problem: Elimination: Goal: Will not experience complications related to bowel motility Outcome: Progressing Goal: Will not experience complications related to urinary retention Outcome: Progressing   Problem: Pain Managment: Goal: General experience of comfort will improve Outcome: Progressing   

## 2022-07-14 NOTE — Plan of Care (Signed)
  Problem: Education: Goal: Knowledge of General Education information will improve Description: Including pain rating scale, medication(s)/side effects and non-pharmacologic comfort measures Outcome: Progressing   Problem: Health Behavior/Discharge Planning: Goal: Ability to manage health-related needs will improve Outcome: Progressing   Problem: Clinical Measurements: Goal: Ability to maintain clinical measurements within normal limits will improve Outcome: Progressing Goal: Will remain free from infection Outcome: Progressing Goal: Diagnostic test results will improve Outcome: Progressing Goal: Respiratory complications will improve Outcome: Progressing Goal: Cardiovascular complication will be avoided Outcome: Progressing   Problem: Activity: Goal: Risk for activity intolerance will decrease Outcome: Progressing   Problem: Nutrition: Goal: Adequate nutrition will be maintained Outcome: Progressing   Problem: Coping: Goal: Level of anxiety will decrease Outcome: Progressing   Problem: Elimination: Goal: Will not experience complications related to bowel motility Outcome: Progressing Goal: Will not experience complications related to urinary retention Outcome: Progressing   Problem: Pain Managment: Goal: General experience of comfort will improve Outcome: Progressing   Problem: Safety: Goal: Ability to remain free from injury will improve Outcome: Progressing   Problem: Skin Integrity: Goal: Risk for impaired skin integrity will decrease Outcome: Progressing   Problem: Coping: Goal: Ability to adjust to condition or change in health will improve Outcome: Progressing   Problem: Fluid Volume: Goal: Ability to maintain a balanced intake and output will improve Outcome: Progressing   Problem: Health Behavior/Discharge Planning: Goal: Ability to identify and utilize available resources and services will improve Outcome: Progressing Goal: Ability to manage  health-related needs will improve Outcome: Progressing   Problem: Metabolic: Goal: Ability to maintain appropriate glucose levels will improve Outcome: Progressing   Problem: Nutritional: Goal: Maintenance of adequate nutrition will improve Outcome: Progressing Goal: Progress toward achieving an optimal weight will improve Outcome: Progressing   Problem: Skin Integrity: Goal: Risk for impaired skin integrity will decrease Outcome: Progressing   Problem: Tissue Perfusion: Goal: Adequacy of tissue perfusion will improve Outcome: Progressing   Problem: Activity: Goal: Capacity to carry out activities will improve Outcome: Progressing   Problem: Cardiac: Goal: Ability to achieve and maintain adequate cardiopulmonary perfusion will improve Outcome: Progressing   Problem: Education: Goal: Patient will understand all VAD equipment and how it functions Outcome: Progressing Goal: Patient will be able to verbalize current INR target range and antiplatelet therapy for discharge home Outcome: Progressing   Problem: Cardiac: Goal: LVAD will function as expected and patient will experience no clinical alarms Outcome: Progressing

## 2022-07-15 LAB — CBC
HCT: 25.5 % — ABNORMAL LOW (ref 39.0–52.0)
Hemoglobin: 8.3 g/dL — ABNORMAL LOW (ref 13.0–17.0)
MCH: 28.7 pg (ref 26.0–34.0)
MCHC: 32.5 g/dL (ref 30.0–36.0)
MCV: 88.2 fL (ref 80.0–100.0)
Platelets: 177 10*3/uL (ref 150–400)
RBC: 2.89 MIL/uL — ABNORMAL LOW (ref 4.22–5.81)
RDW: 18.8 % — ABNORMAL HIGH (ref 11.5–15.5)
WBC: 6.1 10*3/uL (ref 4.0–10.5)
nRBC: 0 % (ref 0.0–0.2)

## 2022-07-15 LAB — BASIC METABOLIC PANEL
Anion gap: 8 (ref 5–15)
BUN: 29 mg/dL — ABNORMAL HIGH (ref 8–23)
CO2: 22 mmol/L (ref 22–32)
Calcium: 8.7 mg/dL — ABNORMAL LOW (ref 8.9–10.3)
Chloride: 108 mmol/L (ref 98–111)
Creatinine, Ser: 1.37 mg/dL — ABNORMAL HIGH (ref 0.61–1.24)
GFR, Estimated: 59 mL/min — ABNORMAL LOW (ref >60)
Glucose, Bld: 181 mg/dL — ABNORMAL HIGH (ref 70–99)
Potassium: 3.9 mmol/L (ref 3.5–5.1)
Sodium: 138 mmol/L (ref 135–145)

## 2022-07-15 LAB — LACTATE DEHYDROGENASE: LDH: 123 U/L (ref 98–192)

## 2022-07-15 LAB — PROTIME-INR
INR: 1.4 — ABNORMAL HIGH (ref 0.8–1.2)
Prothrombin Time: 16.9 seconds — ABNORMAL HIGH (ref 11.4–15.2)

## 2022-07-15 LAB — MAGNESIUM: Magnesium: 1.8 mg/dL (ref 1.7–2.4)

## 2022-07-15 LAB — HEPARIN LEVEL (UNFRACTIONATED): Heparin Unfractionated: <0.1 IU/mL — ABNORMAL LOW (ref 0.30–0.70)

## 2022-07-15 LAB — GLUCOSE, CAPILLARY
Glucose-Capillary: 110 mg/dL — ABNORMAL HIGH (ref 70–99)
Glucose-Capillary: 108 mg/dL — ABNORMAL HIGH (ref 70–99)
Glucose-Capillary: 190 mg/dL — ABNORMAL HIGH (ref 70–99)

## 2022-07-15 MED ORDER — WARFARIN SODIUM 7.5 MG PO TABS
7.5000 mg | ORAL_TABLET | Freq: Once | ORAL | Status: AC
Start: 2022-07-15 — End: 2022-07-15
  Administered 2022-07-15: 7.5 mg via ORAL
  Filled 2022-07-15: qty 1

## 2022-07-15 MED ORDER — MAGNESIUM SULFATE 2 GM/50ML IV SOLN
2.0000 g | Freq: Once | INTRAVENOUS | Status: AC
  Administered 2022-07-15: 2 g via INTRAVENOUS
  Filled 2022-07-15: qty 50

## 2022-07-15 NOTE — Plan of Care (Signed)
  Problem: Education: Goal: Knowledge of General Education information will improve Description: Including pain rating scale, medication(s)/side effects and non-pharmacologic comfort measures Outcome: Progressing   Problem: Health Behavior/Discharge Planning: Goal: Ability to manage health-related needs will improve Outcome: Progressing   Problem: Clinical Measurements: Goal: Ability to maintain clinical measurements within normal limits will improve Outcome: Progressing Goal: Will remain free from infection Outcome: Progressing Goal: Diagnostic test results will improve Outcome: Progressing Goal: Respiratory complications will improve Outcome: Progressing Goal: Cardiovascular complication will be avoided Outcome: Progressing   Problem: Activity: Goal: Risk for activity intolerance will decrease Outcome: Progressing   Problem: Nutrition: Goal: Adequate nutrition will be maintained Outcome: Progressing   Problem: Coping: Goal: Level of anxiety will decrease Outcome: Progressing   Problem: Elimination: Goal: Will not experience complications related to bowel motility Outcome: Progressing Goal: Will not experience complications related to urinary retention Outcome: Progressing   Problem: Pain Managment: Goal: General experience of comfort will improve Outcome: Progressing   Problem: Safety: Goal: Ability to remain free from injury will improve Outcome: Progressing   Problem: Skin Integrity: Goal: Risk for impaired skin integrity will decrease Outcome: Progressing   Problem: Education: Goal: Ability to describe self-care measures that may prevent or decrease complications (Diabetes Survival Skills Education) will improve Outcome: Progressing Goal: Individualized Educational Video(s) Outcome: Progressing   Problem: Coping: Goal: Ability to adjust to condition or change in health will improve Outcome: Progressing   Problem: Fluid Volume: Goal: Ability to  maintain a balanced intake and output will improve Outcome: Progressing   Problem: Health Behavior/Discharge Planning: Goal: Ability to identify and utilize available resources and services will improve Outcome: Progressing Goal: Ability to manage health-related needs will improve Outcome: Progressing   Problem: Metabolic: Goal: Ability to maintain appropriate glucose levels will improve Outcome: Progressing   Problem: Nutritional: Goal: Maintenance of adequate nutrition will improve Outcome: Progressing Goal: Progress toward achieving an optimal weight will improve Outcome: Progressing   Problem: Skin Integrity: Goal: Risk for impaired skin integrity will decrease Outcome: Progressing   Problem: Tissue Perfusion: Goal: Adequacy of tissue perfusion will improve Outcome: Progressing   Problem: Education: Goal: Ability to demonstrate management of disease process will improve Outcome: Progressing Goal: Ability to verbalize understanding of medication therapies will improve Outcome: Progressing Goal: Individualized Educational Video(s) Outcome: Progressing   Problem: Activity: Goal: Capacity to carry out activities will improve Outcome: Progressing   Problem: Cardiac: Goal: Ability to achieve and maintain adequate cardiopulmonary perfusion will improve Outcome: Progressing   Problem: Education: Goal: Patient will understand all VAD equipment and how it functions Outcome: Progressing Goal: Patient will be able to verbalize current INR target range and antiplatelet therapy for discharge home Outcome: Progressing   Problem: Cardiac: Goal: LVAD will function as expected and patient will experience no clinical alarms Outcome: Progressing   

## 2022-07-15 NOTE — Progress Notes (Signed)
LVAD Coordinator Rounding Note:  Admitted 06/12/22 to Dr. Alford Highland service due to drive line debridement scheduled with Dr Donata Clay 06/14/22.   S/p driveline site debridement and wound vac on 7/7.  7/10 Had Ramp ECHO. Speed optimized. 7/13 OR to [facility] dressing change.  Back on Heparin drip.  7/20 OR for debridement and VAC dressing change 7/31 OR for debridement and VAC change  Pt sitting up in recliner this morning. Wound vac removed yesterday evening due to suction tubing dislodgement. Denies complaints other than mild abdominal tenderness.   Reports mild abdominal tenderness where Dr Donata Clay assessed wound, removed sorbact, and changed dressing this morning. Per Dr Zenaida Niece Trigt wound vac no longer needed. Plan for BID sterile wet to dry drive line site dressing changes while hospitalized. Discussed need for wet to dry dressing changes with pt's wife via telephone. She is familiar with dressing change technique as pt was discharged with wet to dry dressing order previously. Will plan to perform dressing change with her tomorrow morning to review procedure.   Transitioned from daptomycin to vancomycin due to elevated CK. Vancomycin planned x 6 weeks; end date 07/18/22. Start Doxycycline 8/11.  Will need to reschedule appt w/[facility] at discharge.   Vital signs: Temp:  97.8 HR: 72 Doppler Pressure: 104 Automatic BP: 131/104 (114) O2 Sat: 96% on RA Wt: 171.7>174.6>178.6>182.9>181>...189.1>189.1>190.9>192>190.7>195.3>198.5>194>195.9>194.8 lbs   LVAD interrogation reveals:  Speed: 5600 Flow:  4.1 Power:  4.1 w PI: 4.1 Alarms: on batteries Events: on batteries Hct: 30  Fixed speed:  5600 Low speed limit:  5300  Drive Line:  Changed this morning by Dr Donata Clay. Guaze dressing clean, dry, and intact. Dressing changed this afternoon per VAD coordinator. April RN observed dressing change this afternoon.   Existing VAD dressing removed and site care performed using  sterile technique. Large amount of thick green drainage noted in wound bed. Cleansed out of wound bed with sterile 4 x 4. Drive line exit site cleaned with Chlora prep applicators x 2, allowed to dry. Pure and clean wound cleanser spray x 3 to wound bed per Dr Donata Clay. Wound back with 2 saline moistened 4 x 4s, covered with several dry 4 x 4s. No redness, foul odor, or tenderness noted. Large amount of thick green drainage in wound bed and on removed packing from previous dressing. Drive line anchor intact. Continue twice daily sterile wet to dry dressing changes per bedside RN or VAD coordinator. Next dressing change due 07/16/22.       Labs:  LDH trend: 249>244>208>22>229>199>210>189>163>160>147>157>147>145>169>149>122>143>152>119>124>126>135>123  INR trend: 1.3 >1.3>1.2>1.1>1.1>1.1>1.1>1.2>1.3>1.5>1.6>1.7>1.7>1.5>1.7>1.8>1.9>1.4>1.2>1.2>1.1>1.4  Anticoagulation Plan: -INR Goal: 2.0 - 2.5 -ASA Dose: none  ICD: N/A  Drips:  Heparin - 500 units/hr  Infection:  06/07/22>> repeat drive line culture>> abundant MRSA; final  Blood Products: - 06/21/22>> 2 PRBC   Plan/Recommendations:  Page VAD coordinators for equipment or drive line issues BID sterile wet to dry drive line site dressing changes per VAD coordinator or bedside RN  Alyce Pagan RN VAD Coordinator  Office: [phone]  24/7 Pager: [phone]

## 2022-07-15 NOTE — Progress Notes (Signed)
ID Brief Note   Attempted to see patient this afternoon, he had gone for a walk per charge RN and could not be seen.   Chart reviewed Remains afebrile, no leukocytosis   Re-evaluated by Dr Drue Second over the weekend due to Small "knot" adjacent to driveline exit site.      Evaluated by CTVS today  with no concerns,  no VAC needed, no plan for repeat OR  Recommendations  Will re-evaluate tomorrow For now, no changes in plan since 8/1 note Patient has a fu appointment made with Dr Luciana Axe on 8/16 at 9:30 am  Odette Fraction, MD Infectious Disease Physician [facility] [address] [place], [place] [address] Phone: [phone]  Fax: [phone]

## 2022-07-15 NOTE — Progress Notes (Addendum)
Patient ID: Kevin Underwood, male   DOB: 18-Sep-1961, 61 y.o.   MRN: [id number]   Advanced Heart Failure VAD Team Note  PCP-Cardiologist: Nona Dell, MD   Subjective:    S/p driveline site debridement and wound vac on 7/7.  7/10 Had Ramp ECHO. Speed optimized. 7/13 OR to VAC change.  Back on Heparin drip.  7/20 OR for debridement and VAC change 7/31 OR for debriedment and VAC change  Scr stable 1.37 today. Doppler MAPs in the 90s.   INR 1.4, on heparin gtt.   Overnight wound vac tubing got dislodged. Plan was for OR again tomorrow. Dr. Maren Beach at bedside cancelled procedure and ok for wet-dry dressing with dressing changes BID.   Otherwise feels ok today. Sitting up in chair. Denies CP and SOB.   LVAD INTERROGATION:  HeartMate III LVAD:   Flow 4.3 liters/min, speed 5600, power 4.1 PI 3.1. VAD interrogated personally. Parameters stable.  Objective:    Vital Signs:   Temp:  [97.6 F (36.4 C)-99.2 F (37.3 C)] 97.8 F (36.6 C) (08/07 0448) Pulse Rate:  [72-86] 76 (08/07 0448) Resp:  [14-20] 16 (08/07 0448) BP: (90-132)/(76-94) 113/87 (08/07 0448) Weight:  [88.4 kg] 88.4 kg (08/07 0448) Last BM Date : 07/13/22 Mean arterial Pressure  90-100s  Intake/Output:   Intake/Output Summary (Last 24 hours) at 07/15/2022 0758 Last data filed at 07/15/2022 0700 Gross per 24 hour  Intake 679.44 ml  Output --  Net 679.44 ml     Physical Exam   General:  well appearing. Looks stated age. No respiratory difficulty HEENT: normal Neck: supple. JVD ~8. Carotids 2+ bilat; no bruits. No lymphadenopathy or thyromegaly appreciated. Cor: Mechanical heart sounds with LVAD hum present.  Driveline site: Covered with gauze and medipore tape. Anchor in place.  Lungs: clear Abdomen: soft, nontender, nondistended. No hepatosplenomegaly. No bruits or masses. Good bowel sounds. Extremities: no cyanosis, clubbing, rash, edema. PICC RUE Neuro: alert & oriented x 3, cranial nerves grossly intact.  moves all 4 extremities w/o difficulty. Affect pleasant.   Telemetry   NSR 80's,  4 beats NSVT (Personally reviewed)   Labs   Basic Metabolic Panel: Recent Labs  Lab 07/11/22 0330 07/12/22 0505 07/13/22 0335 07/14/22 0052 07/15/22 0054  NA 139 136 135 137 138  K 3.8 3.9 3.8 4.0 3.9  CL 104 104 102 107 108  CO2 28 25 25 23 22   GLUCOSE 157* 102* 162* 132* 181*  BUN 27* 28* 28* 26* 29*  CREATININE 1.22 1.20 1.20 1.16 1.37*  CALCIUM 8.9 9.3 9.1 8.8* 8.7*    Liver Function Tests: No results for input(s): "AST", "ALT", "ALKPHOS", "BILITOT", "PROT", "ALBUMIN" in the last 168 hours.  No results for input(s): "LIPASE", "AMYLASE" in the last 168 hours. No results for input(s): "AMMONIA" in the last 168 hours.  CBC: Recent Labs  Lab 07/10/22 0410 07/11/22 0330 07/12/22 0505 07/13/22 0335 07/15/22 0054  WBC 7.3 7.0 7.8 8.3 6.1  HGB 9.5* 9.3* 10.0* 9.1* 8.3*  HCT 29.3* 28.6* 30.2* 28.3* 25.5*  MCV 87.5 88.3 87.3 88.7 88.2  PLT 181 185 192 185 177    INR: Recent Labs  Lab 07/11/22 0330 07/12/22 0505 07/13/22 0335 07/14/22 0052 07/15/22 0054  INR 1.2 1.1 1.3* 1.4* 1.4*    Other results:   Imaging   No results found.   Medications:     Scheduled Medications:  allopurinol  100 mg Oral Daily   Chlorhexidine Gluconate Cloth  6 each Topical Daily  docusate sodium  200 mg Oral BID   [START ON 07/19/2022] doxycycline  100 mg Oral Q12H   gabapentin  600 mg Oral BID   guaiFENesin  600 mg Oral BID   hydrALAZINE  25 mg Oral TID   insulin aspart  0-15 Units Subcutaneous TID WC   insulin aspart  0-5 Units Subcutaneous QHS   insulin aspart  3 Units Subcutaneous TID WC   insulin detemir  15 Units Subcutaneous QHS   lactose free nutrition  237 mL Oral TID WC   loratadine  10 mg Oral Daily   methimazole  20 mg Oral BID   metoprolol succinate  25 mg Oral Daily   pantoprazole  40 mg Oral Daily   polyethylene glycol  17 g Oral Daily   rosuvastatin  20 mg Oral Daily    sertraline  50 mg Oral Daily   simethicone  80 mg Oral QID   tamsulosin  0.4 mg Oral Daily   traZODone  150 mg Oral QHS   Warfarin - Pharmacist Dosing Inpatient   Does not apply q1600    Infusions:  heparin 500 Units/hr (07/15/22 0700)   vancomycin Stopped (07/15/22 0020)    PRN Medications: acetaminophen, albuterol, morphine injection, ondansetron (ZOFRAN) IV, mouth rinse, [DISCONTINUED] oxyCODONE **OR** oxyCODONE, sorbitol, traMADol   Patient Profile   61 y.o. male with history of HM III LVAD, chronic systolic CHF/ICM, COPD, PAF. 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   Readmitted for driveline infection and surgical debridement/possible wound vac placement.  Assessment/Plan:    1. Driveline infection: Recent driveline infection [id number]. S/p driveline site debridement 6/8. Surgical wound cultures grew MRSA, repeat debridement 6/23. Driveline debridement with wound vac placement on 7/7. 7/13, 7/20 VAC dressing change. Went back to OR again on 7/31 for debridement and VAC change - Transitioned from daptomycin to vancomycin due to elevated CK.  Vancomycin planned x 6 wks, end date 07/18/22.  - INR 1.4 today. Heparin restarted post-op (heparin for INR < 1.8). Keep INR 1.8-2 range. - OR cancelled for tomorrow. Plan to stabilize INR better and he can probably go home later this week per CTS. Keep wet-dry dressings with changed BID. - Concern that new indurated area could represent residual infection per ID 2.  Chronic systolic CHF: Echo [id number] with EF 20-25%, mildly decreased RV function. LHC/RHC in 12/21 with patent grafts, low output. Suspect mixed ischemic/nonischemic cardiomyopathy (prior heavy ETOH and drugs as well as CAD).  No ETOH, drugs, smoking since CABG in 12/20. Admitted with cardiogenic shock in 12/21, had placement of Impella 5.5 initially, now s/p Heartmate 3 LVAD on 11/17/20.  Ramp echo 2/23 with speed decreased to 5500 rpm. He would  eventually like heart transplant. NYHA class II. Have been allowing MAP to run higher due to recent AKI thought to be due to lowering BP too much in setting of right renal artery stenosis. Had Ramp ECHO with speed optimized. Volume status stable. MAPs generally in 90s   - INR 1.4. continue heparin gtt/warfarin.  - Continue Toprol XL 25 mg daily.  - Continue hydralazine 25 mg tid.  - PICC placed for long-term antibiotic use 3. AKI: Creatinine up to 4.6 last admission, initially thought this was ATN from low BP but he was never markedly hypotensive and LFTs without any evidence of shock liver. ? Vanc toxicity (troughs were ok). Taken off Entresto and hydralazine. Volume repleted with IVF. In reviewing prior CTA abdomen/pelvis, he has high-grade  stenosis of proximal right renal artery. D/w Renal and feel like he may need higher perfusion pressures. Scr now stable, 1.2-1.4 this admit. MAPs ok generally 90s.  - Would consider reassessment of renal artery stenosis and possible treatment once creatinine has improved to baseline.  4.  CAD: S/p CABG 12/20.  LHC pre-VAD with patent grafts, no target for intervention. - Continue statin.  5.  Atrial fibrillation: Paroxysmal.  S/p Maze and LA appendage clip with CABG in 12/20. In NSR today  - Continue Toprol XL 25 mg daily.  - Off amiodarone with hyperthyroidism. 6.  Type 2 diabetes: Takes glipizide at home.  - SSI  - Continue Levemir 15 units qHS - Diabetes coordinator following 7. 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  8. 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  He missed his endocrinology appt. Had been rescheduled for July 27th, will need to reschedule again. - He is now off amiodarone.   - Continue methimazole 20 mg bid, TSH was mildly elevated.   9.  Gout: On allopurinol 10. Anemia (chronic blood loss): Got 1u RBCs on 7/12. Hgb 8.3 today  11. NSVT: 4 beats overnight. Daily BMET. K. 3.9. Mag pending  I  reviewed the LVAD parameters from today, and compared the results to the patient's prior recorded data.  No programming changes were made.  The LVAD is functioning within specified parameters.  The patient performs LVAD self-test daily.  LVAD interrogation was negative for any significant power changes, alarms or PI events/speed drops.  LVAD equipment check completed and is in good working order.  Back-up equipment present.   LVAD education done on emergency procedures and precautions and reviewed exit site care.  Length of Stay: [address] AGACNP-BC  07/15/2022  Patient seen with NP, agree with the above note.   No complaints today.    Wound vac now off, getting wet to dry dressings.    INR 1.4 on warfarin/heparin.   General: Well appearing this am. NAD.  HEENT: Normal. Neck: Supple, JVP 7-8 cm. Carotids OK.  Cardiac:  Mechanical heart sounds with LVAD hum present.  Lungs:  CTAB, normal effort.  Abdomen:  NT, ND, no HSM. No bruits or masses. +BS  LVAD exit site: Well-healed and incorporated. Dressing dry and intact. No erythema or drainage. Stabilization device present and accurately applied. Driveline dressing changed daily per sterile technique. Extremities:  Warm and dry. No cyanosis, clubbing, rash, or edema.  Neuro:  Alert & oriented x 3. Cranial nerves grossly intact. Moves all 4 extremities w/o difficulty. Affect pleasant    He will not be returning to OR per discussion with Dr. Donata Clay.  Once INR > 1.7, will let him go home.  Will need to complete course of abx per ID.   Marca Ancona 07/15/2022 11:03 AM

## 2022-07-15 NOTE — Progress Notes (Signed)
7 Days Post-Op Procedure(s) (LRB): ABDOMINAL WOUND DEBRIDEMENT (N/A) WOUND VAC CHANGE (N/A) Subjective: Wound examined, sorbact removed, new dressing done with two 4x4 guazes  saline wet/dry  No VAC needed PLan BID dressing chg  saline wt/dry while in hospital Home when INR ok Daily dressing change at home Weekly clinic visits Mondays at 0830  Objective: Vital signs in last 24 hours: Temp:  [97.8 F (36.6 C)-99.2 F (37.3 C)] 97.8 F (36.6 C) (08/07 0448) Pulse Rate:  [72-86] 76 (08/07 0448) Cardiac Rhythm: Normal sinus rhythm (08/07 0707) Resp:  [14-16] 16 (08/07 0448) BP: (90-132)/(76-90) 113/87 (08/07 0448) Weight:  [88.4 kg] 88.4 kg (08/07 0448)  Hemodynamic parameters for last 24 hours:    Intake/Output from previous day: 08/06 0701 - 08/07 0700 In: 679.4 [I.V.:179.4; IV Piggyback:500] Out: -  Intake/Output this shift: No intake/output data recorded.  Wound contacting, tissue matrix encorporated  Lab Results: Recent Labs    07/13/22 0335 07/15/22 0054  WBC 8.3 6.1  HGB 9.1* 8.3*  HCT 28.3* 25.5*  PLT 185 177   BMET:  Recent Labs    07/14/22 0052 07/15/22 0054  NA 137 138  K 4.0 3.9  CL 107 108  CO2 23 22  GLUCOSE 132* 181*  BUN 26* 29*  CREATININE 1.16 1.37*  CALCIUM 8.8* 8.7*    PT/INR:  Recent Labs    07/15/22 0054  LABPROT 16.9*  INR 1.4*   ABG    Component Value Date/Time   PHART 7.508 (H) 11/20/2020 0438   HCO3 24.8 09/09/2021 0313   TCO2 29 11/20/2020 0438   ACIDBASEDEF 6.0 (H) 11/24/2019 1718   O2SAT 63.4 09/09/2021 0313   CBG (last 3)  Recent Labs    07/14/22 1702 07/14/22 2159 07/15/22 0631  GLUCAP 142* 169* 110*    Assessment/Plan: S/P Procedure(s) (LRB): ABDOMINAL WOUND DEBRIDEMENT (N/A) WOUND VAC CHANGE (N/A) Transition wound care to wet/dry packing   LOS: 33 days    Lovett Sox 07/15/2022

## 2022-07-15 NOTE — Progress Notes (Signed)
ANTICOAGULATION CONSULT NOTE  Pharmacy Consult for warfarin + heparin Indication:  LVAD  No Known Allergies  Patient Measurements: Height: 5\' 10"  (177.8 cm) Weight: 88.4 kg (194 lb 14.2 oz) IBW/kg (Calculated) : 73 Heparin Dosing Weight: 80kg  Vital Signs: Temp: 97.7 F (36.5 C) (08/07 1158) Temp Source: Oral (08/07 1158) BP: 120/72 (08/07 1158) Pulse Rate: 71 (08/07 1158)  Labs: Recent Labs    07/13/22 0335 07/14/22 0052 07/15/22 0054  HGB 9.1*  --  8.3*  HCT 28.3*  --  25.5*  PLT 185  --  177  LABPROT 16.1* 16.8* 16.9*  INR 1.3* 1.4* 1.4*  HEPARINUNFRC <0.10* <0.10* <0.10*  CREATININE 1.20 1.16 1.37*     Estimated Creatinine Clearance: 63.4 mL/min (A) (by C-G formula based on SCr of 1.37 mg/dL (H)).   Medical History: Past Medical History:  Diagnosis Date   "    Arthritis    CAD (coronary artery disease)    a. s/p CABG in 11/2019 with LIMA-LAD, SVG-OM1, SVG-PDA and SVG-D1   CHF (congestive heart failure) (HCC)    a. EF < 20% by echo in 11/2019   Essential hypertension    PAF (paroxysmal atrial fibrillation) (HCC)    Type 2 diabetes mellitus (HCC)     Assessment: 56 yoM with HM3 LVAD admitted with ongoing LVAD driveline infection. Pt with planned debridements in OR. Pt is on warfarin PTA.  Pt s/p multiple debridements for wound infection, last 7/31. Per Dr. 8/31 no additional debridements planned.  INR today is subtherapeutic at 1.4, CBC and LDH stable. Heparin level <0.1 as expected.   *Home warfarin dose 6mg  MWF, 4mg  AODs  Goal of Therapy:  INR goal 2-2.5 Heparin level < 0.3 units/ml Monitor platelets by anticoagulation protocol: Yes   Plan:  Continue heparin 500 units/h  Give larger warfarin dose of  7.5 mg x1 today  Daily INR, heparin level, CBC, LDH  Thank you for allowing pharmacy to participate in this patient's care.  Donata Clay, , BCCP Clinical Pharmacist  07/15/2022 1:23 PM   [facility] pharmacy phone numbers are  listed on [email or web address]

## 2022-07-16 ENCOUNTER — Encounter (HOSPITAL_COMMUNITY)
Admission: AD | Disposition: A | Discharge status: Home / Self Care | Payer: Self-pay | Source: Home / Self Care | Attending: Cardiology

## 2022-07-16 DIAGNOSIS — B9562 Methicillin resistant Staphylococcus aureus infection as the cause of diseases classified elsewhere: Secondary | ICD-10-CM | POA: Diagnosis not present

## 2022-07-16 DIAGNOSIS — T827XXA Infection and inflammatory reaction due to other cardiac and vascular devices, implants and grafts, initial encounter: Secondary | ICD-10-CM | POA: Diagnosis not present

## 2022-07-16 LAB — PROTIME-INR
INR: 1.4 — ABNORMAL HIGH (ref 0.8–1.2)
Prothrombin Time: 16.6 seconds — ABNORMAL HIGH (ref 11.4–15.2)

## 2022-07-16 LAB — BASIC METABOLIC PANEL
Anion gap: 6 (ref 5–15)
BUN: 27 mg/dL — ABNORMAL HIGH (ref 8–23)
CO2: 25 mmol/L (ref 22–32)
Calcium: 8.7 mg/dL — ABNORMAL LOW (ref 8.9–10.3)
Chloride: 105 mmol/L (ref 98–111)
Creatinine, Ser: 1.2 mg/dL (ref 0.61–1.24)
GFR, Estimated: >60 mL/min (ref >60)
Glucose, Bld: 161 mg/dL — ABNORMAL HIGH (ref 70–99)
Potassium: 3.7 mmol/L (ref 3.5–5.1)
Sodium: 136 mmol/L (ref 135–145)

## 2022-07-16 LAB — GLUCOSE, CAPILLARY
Glucose-Capillary: 107 mg/dL — ABNORMAL HIGH (ref 70–99)
Glucose-Capillary: 166 mg/dL — ABNORMAL HIGH (ref 70–99)
Glucose-Capillary: 200 mg/dL — ABNORMAL HIGH (ref 70–99)
Glucose-Capillary: 144 mg/dL — ABNORMAL HIGH (ref 70–99)
Glucose-Capillary: 207 mg/dL — ABNORMAL HIGH (ref 70–99)

## 2022-07-16 LAB — CBC
HCT: 26.1 % — ABNORMAL LOW (ref 39.0–52.0)
Hemoglobin: 8.7 g/dL — ABNORMAL LOW (ref 13.0–17.0)
MCH: 29.2 pg (ref 26.0–34.0)
MCHC: 33.3 g/dL (ref 30.0–36.0)
MCV: 87.6 fL (ref 80.0–100.0)
Platelets: 185 10*3/uL (ref 150–400)
RBC: 2.98 MIL/uL — ABNORMAL LOW (ref 4.22–5.81)
RDW: 18.7 % — ABNORMAL HIGH (ref 11.5–15.5)
WBC: 7.2 10*3/uL (ref 4.0–10.5)
nRBC: 0 % (ref 0.0–0.2)

## 2022-07-16 LAB — LACTATE DEHYDROGENASE: LDH: 127 U/L (ref 98–192)

## 2022-07-16 LAB — HEPARIN LEVEL (UNFRACTIONATED): Heparin Unfractionated: <0.1 IU/mL — ABNORMAL LOW (ref 0.30–0.70)

## 2022-07-16 SURGERY — DEBRIDEMENT, WOUND, STERNUM
Anesthesia: General

## 2022-07-16 MED ORDER — POTASSIUM CHLORIDE CRYS ER 20 MEQ PO TBCR
20.0000 meq | EXTENDED_RELEASE_TABLET | Freq: Once | ORAL | Status: AC
Start: 2022-07-16 — End: 2022-07-16
  Administered 2022-07-16: 20 meq via ORAL
  Filled 2022-07-16: qty 1

## 2022-07-16 MED ORDER — WARFARIN SODIUM 4 MG PO TABS
8.0000 mg | ORAL_TABLET | Freq: Once | ORAL | Status: AC
  Administered 2022-07-16: 8 mg via ORAL
  Filled 2022-07-16: qty 2

## 2022-07-16 NOTE — Progress Notes (Signed)
ANTICOAGULATION CONSULT NOTE  Pharmacy Consult for warfarin + heparin Indication:  LVAD  No Known Allergies  Patient Measurements: Height: 5\' 10"  (177.8 cm) Weight: 89.7 kg (197 lb 12 oz) IBW/kg (Calculated) : 73 Heparin Dosing Weight: 80kg  Vital Signs: Temp: 98.3 F (36.8 C) (08/08 1057) Temp Source: Oral (08/08 1057) BP: 118/99 (08/08 1057) Pulse Rate: 74 (08/08 1057)  Labs: Recent Labs    07/14/22 0052 07/15/22 0054 07/16/22 0320  HGB  --  8.3* 8.7*  HCT  --  25.5* 26.1*  PLT  --  177 185  LABPROT 16.8* 16.9* 16.6*  INR 1.4* 1.4* 1.4*  HEPARINUNFRC <0.10* <0.10* <0.10*  CREATININE 1.16 1.37* 1.20     Estimated Creatinine Clearance: 72.9 mL/min (by C-G formula based on SCr of 1.2 mg/dL).   Medical History: Past Medical History:  Diagnosis Date   "    Arthritis    CAD (coronary artery disease)    a. s/p CABG in 11/2019 with LIMA-LAD, SVG-OM1, SVG-PDA and SVG-D1   CHF (congestive heart failure) (HCC)    a. EF < 20% by echo in 11/2019   Essential hypertension    PAF (paroxysmal atrial fibrillation) (HCC)    Type 2 diabetes mellitus (HCC)     heparin 500 Units/hr (07/16/22 0535)   vancomycin Stopped (07/16/22 0017)     Assessment: 61 yoM with HM3 LVAD admitted with ongoing LVAD driveline infection. Pt with planned debridements in OR. Pt is on warfarin PTA.  Pt s/p multiple debridements for wound infection, last 7/31. Per Dr. 8/31 no additional debridements planned.  INR today remains subtherapeutic at 1.4, CBC and LDH stable. Heparin level <0.1 as expected.   *Home warfarin dose 6mg  MWF, 4mg  AODs  Goal of Therapy:  INR goal 2-2.5 Heparin level < 0.3 units/ml Monitor platelets by anticoagulation protocol: Yes   Plan:  Continue heparin 500 units/h  Give larger warfarin dose of  8 mg x1 today  Daily INR, heparin level, CBC, LDH  Thank you for allowing pharmacy to participate in this patient's care.  Donata Clay, , BCPS,  BCCP Clinical Pharmacist  07/16/2022 1:28 PM   [facility] pharmacy phone numbers are listed on [email or web address]

## 2022-07-16 NOTE — Plan of Care (Signed)
  Problem: Education: Goal: Knowledge of General Education information will improve Description: Including pain rating scale, medication(s)/side effects and non-pharmacologic comfort measures Outcome: Progressing   Problem: Health Behavior/Discharge Planning: Goal: Ability to manage health-related needs will improve Outcome: Progressing   Problem: Clinical Measurements: Goal: Ability to maintain clinical measurements within normal limits will improve Outcome: Progressing Goal: Will remain free from infection Outcome: Progressing Goal: Diagnostic test results will improve Outcome: Progressing Goal: Respiratory complications will improve Outcome: Progressing Goal: Cardiovascular complication will be avoided Outcome: Progressing   Problem: Activity: Goal: Risk for activity intolerance will decrease Outcome: Progressing   Problem: Nutrition: Goal: Adequate nutrition will be maintained Outcome: Progressing   Problem: Coping: Goal: Level of anxiety will decrease Outcome: Progressing   Problem: Elimination: Goal: Will not experience complications related to bowel motility Outcome: Progressing Goal: Will not experience complications related to urinary retention Outcome: Progressing   Problem: Pain Managment: Goal: General experience of comfort will improve Outcome: Progressing   Problem: Safety: Goal: Ability to remain free from injury will improve Outcome: Progressing   Problem: Skin Integrity: Goal: Risk for impaired skin integrity will decrease Outcome: Progressing   Problem: Education: Goal: Ability to describe self-care measures that may prevent or decrease complications (Diabetes Survival Skills Education) will improve Outcome: Progressing Goal: Individualized Educational Video(s) Outcome: Progressing   Problem: Coping: Goal: Ability to adjust to condition or change in health will improve Outcome: Progressing   Problem: Fluid Volume: Goal: Ability to  maintain a balanced intake and output will improve Outcome: Progressing   Problem: Health Behavior/Discharge Planning: Goal: Ability to identify and utilize available resources and services will improve Outcome: Progressing Goal: Ability to manage health-related needs will improve Outcome: Progressing   Problem: Metabolic: Goal: Ability to maintain appropriate glucose levels will improve Outcome: Progressing   Problem: Nutritional: Goal: Maintenance of adequate nutrition will improve Outcome: Progressing Goal: Progress toward achieving an optimal weight will improve Outcome: Progressing   Problem: Skin Integrity: Goal: Risk for impaired skin integrity will decrease Outcome: Progressing   Problem: Tissue Perfusion: Goal: Adequacy of tissue perfusion will improve Outcome: Progressing   Problem: Education: Goal: Ability to demonstrate management of disease process will improve Outcome: Progressing Goal: Ability to verbalize understanding of medication therapies will improve Outcome: Progressing Goal: Individualized Educational Video(s) Outcome: Progressing   Problem: Activity: Goal: Capacity to carry out activities will improve Outcome: Progressing   Problem: Cardiac: Goal: Ability to achieve and maintain adequate cardiopulmonary perfusion will improve Outcome: Progressing   Problem: Education: Goal: Patient will understand all VAD equipment and how it functions Outcome: Progressing Goal: Patient will be able to verbalize current INR target range and antiplatelet therapy for discharge home Outcome: Progressing   Problem: Cardiac: Goal: LVAD will function as expected and patient will experience no clinical alarms Outcome: Progressing

## 2022-07-16 NOTE — Progress Notes (Addendum)
Patient ID: Kevin Underwood, male   DOB: 21-May-1961, 61 y.o.   MRN: [id number]   Advanced Heart Failure VAD Team Note  PCP-Cardiologist: Rozann Lesches, MD   Subjective:    S/p driveline site debridement and wound vac on 7/7.  7/10 Had Ramp ECHO. Speed optimized. 7/13 OR to VAC change.  Back on Heparin drip.  7/20 OR for debridement and VAC change 7/31 OR for debriedment and VAC change  Scr stable 1.2 today. Doppler MAPs low 100's.   INR 1.4, on heparin gtt.   Continue sterile wet to dry dressing changes around driveline site.   Feels good today. Sitting up in chair. Denies CP and SOB.   LVAD INTERROGATION:  HeartMate III LVAD:   Flow 4 liters/min, speed 5600, power 4.2 PI 4.1. VAD interrogated personally. Parameters stable.  Objective:    Vital Signs:   Temp:  [97.7 F (36.5 C)-98 F (36.7 C)] 98 F (36.7 C) (08/08 0502) Pulse Rate:  [71-84] 74 (08/08 0003) Resp:  [16-17] 17 (08/08 0502) BP: (84-138)/(56-85) 84/56 (08/08 0502) SpO2:  [100 %] 100 % (08/08 0502) Weight:  [89.7 kg] 89.7 kg (08/08 0500) Last BM Date : 07/15/22 Mean arterial Pressure  90-100s  Intake/Output:   Intake/Output Summary (Last 24 hours) at 07/16/2022 0706 Last data filed at 07/16/2022 0535 Gross per 24 hour  Intake 642.69 ml  Output --  Net 642.69 ml     Physical Exam   General:  well appearing. Looks stated age. No respiratory difficulty HEENT: normal Neck: supple. JVD ~8 cm. Carotids 2+ bilat; no bruits. No lymphadenopathy or thyromegaly appreciated. Cor: Mechanical heart sounds with LVAD hum present.  Driveline site: Covered with gauze and medipore tape. Anchor in place.  Lungs: clear Abdomen: soft, nontender, nondistended. No hepatosplenomegaly. No bruits or masses. Good bowel sounds. Extremities: no cyanosis, clubbing, rash, edema. PICC RUE. Neuro: alert & oriented x 3, cranial nerves grossly intact. moves all 4 extremities w/o difficulty. Affect pleasant.   Telemetry   NSR 70's  (Personally reviewed)   Labs   Basic Metabolic Panel: Recent Labs  Lab 07/12/22 0505 07/13/22 0335 07/14/22 0052 07/15/22 0054 07/15/22 0842 07/16/22 0320  NA 136 135 137 138  --  136  K 3.9 3.8 4.0 3.9  --  3.7  CL 104 102 107 108  --  105  CO2 25 25 23 22   --  25  GLUCOSE 102* 162* 132* 181*  --  161*  BUN 28* 28* 26* 29*  --  27*  CREATININE 1.20 1.20 1.16 1.37*  --  1.20  CALCIUM 9.3 9.1 8.8* 8.7*  --  8.7*  MG  --   --   --   --  1.8  --     Liver Function Tests: No results for input(s): "AST", "ALT", "ALKPHOS", "BILITOT", "PROT", "ALBUMIN" in the last 168 hours.  No results for input(s): "LIPASE", "AMYLASE" in the last 168 hours. No results for input(s): "AMMONIA" in the last 168 hours.  CBC: Recent Labs  Lab 07/11/22 0330 07/12/22 0505 07/13/22 0335 07/15/22 0054 07/16/22 0320  WBC 7.0 7.8 8.3 6.1 7.2  HGB 9.3* 10.0* 9.1* 8.3* 8.7*  HCT 28.6* 30.2* 28.3* 25.5* 26.1*  MCV 88.3 87.3 88.7 88.2 87.6  PLT 185 192 185 177 185    INR: Recent Labs  Lab 07/12/22 0505 07/13/22 0335 07/14/22 0052 07/15/22 0054 07/16/22 0320  INR 1.1 1.3* 1.4* 1.4* 1.4*    Other results:   Imaging  No results found.   Medications:     Scheduled Medications:  allopurinol  100 mg Oral Daily   Chlorhexidine Gluconate Cloth  6 each Topical Daily   docusate sodium  200 mg Oral BID   [START ON 07/19/2022] doxycycline  100 mg Oral Q12H   gabapentin  600 mg Oral BID   guaiFENesin  600 mg Oral BID   hydrALAZINE  25 mg Oral TID   insulin aspart  0-15 Units Subcutaneous TID WC   insulin aspart  0-5 Units Subcutaneous QHS   insulin aspart  3 Units Subcutaneous TID WC   insulin detemir  15 Units Subcutaneous QHS   lactose free nutrition  237 mL Oral TID WC   loratadine  10 mg Oral Daily   methimazole  20 mg Oral BID   metoprolol succinate  25 mg Oral Daily   pantoprazole  40 mg Oral Daily   polyethylene glycol  17 g Oral Daily   rosuvastatin  20 mg Oral Daily    sertraline  50 mg Oral Daily   simethicone  80 mg Oral QID   tamsulosin  0.4 mg Oral Daily   traZODone  150 mg Oral QHS   Warfarin - Pharmacist Dosing Inpatient   Does not apply q1600    Infusions:  heparin 500 Units/hr (07/16/22 0535)   vancomycin Stopped (07/16/22 0017)    PRN Medications: acetaminophen, albuterol, morphine injection, ondansetron (ZOFRAN) IV, mouth rinse, [DISCONTINUED] oxyCODONE **OR** oxyCODONE, sorbitol, traMADol   Patient Profile   61 y.o. male with history of HM III LVAD, chronic systolic CHF/ICM, COPD, PAF. 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   Readmitted for driveline infection and surgical debridement/possible wound vac placement.  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 S/p driveline site debridement 6/8. Surgical wound cultures grew MRSA, repeat debridement 6/23. Driveline debridement with wound vac placement on 7/7. 7/13, 7/20 VAC dressing change. Went back to OR again on 7/31 for debridement and VAC change - Transitioned from daptomycin to vancomycin due to elevated CK.  Vancomycin planned x 6 wks, end date 07/18/22.  - INR 1.4 today. Heparin restarted post-op (heparin for INR < 1.8). Plan to d/c home once INR >1.7 and abx course completed.  - Plan to stabilize INR better and he can probably go home later this week per CTS. Keep sterile wet-dry dressings with changed BID. - Plan to continue PO doxycycline once IV abx complete for suppression per ID. Set to start 8/11 2.  Chronic systolic CHF: Echo 10/21 with EF 20-25%, mildly decreased RV function. LHC/RHC in 12/21 with patent grafts, low output. Suspect mixed ischemic/nonischemic cardiomyopathy (prior heavy ETOH and drugs as well as CAD).  No ETOH, drugs, smoking since CABG in 12/20. Admitted with cardiogenic shock in 12/21, had placement of Impella 5.5 initially, now s/p Heartmate 3 LVAD on 11/17/20.  Ramp echo 2/23 with  speed decreased to 5500 rpm. He would eventually like heart transplant. NYHA class II. Have been allowing MAP to run higher due to recent AKI thought to be due to lowering BP too much in setting of right renal artery stenosis. Had Ramp ECHO with speed optimized. Volume status stable. MAPs generally in 90s   - INR 1.4. continue heparin gtt/warfarin.  - Continue Toprol XL 25 mg daily.  - Continue hydralazine 25 mg tid.  - PICC placed for long-term antibiotic use, may be able to d/c prior to discharge if abx course  gets completed.  3. AKI: Creatinine up to 4.6 last admission, initially thought this was ATN from low BP but he was never markedly hypotensive and LFTs without any evidence of shock liver. ? Vanc toxicity (troughs were ok). Taken off Entresto and hydralazine. Volume repleted with IVF. In reviewing prior CTA abdomen/pelvis, he has high-grade stenosis of proximal right renal artery. D/w Renal and feel like he may need higher perfusion pressures. Scr now stable, 1.2-1.4 this admit. MAPs ok generally 90s.  - Would consider reassessment of renal artery stenosis and possible treatment once creatinine has improved to baseline.  4.  CAD: S/p CABG 12/20.  LHC pre-VAD with patent grafts, no target for intervention. - Continue statin.  5.  Atrial fibrillation: Paroxysmal.  S/p Maze and LA appendage clip with CABG in 12/20. In NSR today  - Continue Toprol XL 25 mg daily.  - Off amiodarone with hyperthyroidism. 6.  Type 2 diabetes: Takes glipizide at home.  - SSI  - Continue Levemir 15 units qHS - Diabetes coordinator following 7. 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  8. 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  He missed his endocrinology appt. Had been rescheduled for July 27th, will need to reschedule again. - He is now off amiodarone.   - Continue methimazole 20 mg bid, TSH was mildly elevated.   9.  Gout: On allopurinol 10. Anemia (chronic blood  loss): Got 1u RBCs on 7/12. Hgb 8.7 today  11. NSVT: 4 beats 8/7. Daily BMET. K. 3.7. Mag 1.8  I reviewed the LVAD parameters from today, and compared the results to the patient's prior recorded data.  No programming changes were made.  The LVAD is functioning within specified parameters.  The patient performs LVAD self-test daily.  LVAD interrogation was negative for any significant power changes, alarms or PI events/speed drops.  LVAD equipment check completed and is in good working order.  Back-up equipment present.   LVAD education done on emergency procedures and precautions and reviewed exit site care.  Length of Stay: [address] AGACNP-BC  07/16/2022  Patient seen with NP, agree with the above note.   INR 1.4 today.  No complaints.   General: Well appearing this am. NAD.  HEENT: Normal. Neck: Supple, JVP 7-8 cm. Carotids OK.  Cardiac:  Mechanical heart sounds with LVAD hum present.  Lungs:  CTAB, normal effort.  Abdomen:  NT, ND, no HSM. No bruits or masses. +BS  LVAD exit site: Dressing over site.  Extremities:  Warm and dry. No cyanosis, clubbing, rash, or edema.  Neuro:  Alert & oriented x 3. Cranial nerves grossly intact. Moves all 4 extremities w/o difficulty. Affect pleasant    Plan for discharge on 8/10 after he completes IV antibiotics (vancomycin) as long as INR > 1.7.  Will transition to doxycycline at that point.    Continue warfarin/heparin overlap until INR > 1.7.    Marca Ancona 07/16/2022 1:40 PM

## 2022-07-16 NOTE — Progress Notes (Signed)
LVAD Coordinator Rounding Note:  Admitted 06/12/22 to Dr. Alford Highland service due to drive line debridement scheduled with Dr Donata Clay 06/14/22.   S/p driveline site debridement and wound vac on 7/7.  7/10 Had Ramp ECHO. Speed optimized. 7/13 OR to [facility] dressing change.  Back on Heparin drip.  7/20 OR for debridement and VAC dressing change 7/31 OR for debridement and VAC change  Pt sitting up in recliner this morning.  Denies complaints other than mild abdominal burning at exit site this morning that has since resolved.   Plan for BID sterile wet to dry drive line site dressing changes while hospitalized. Pt's wife Kennith Center performed dressing change this morning with VAD coordinator supervision and verbal cues.   Transitioned from daptomycin to vancomycin due to elevated CK. Vancomycin planned x 6 weeks; end date 07/18/22. Start Doxycycline 8/11.  Will need to reschedule appt w/[facility] at discharge.   Vital signs: Temp:  98.3 HR: 78 Doppler Pressure: 104 Automatic BP: 132/103 (113) O2 Sat: 96% on RA Wt: 171.7>174.6>178.6>182.9>181>...189.1>189.1>190.9>192>190.7>195.3>198.5>194>195.9>194.8>197.6 lbs   LVAD interrogation reveals:  Speed: 5600 Flow:  4.4 Power:  4.2 w PI: 3.0 Alarms: none Events: 36 PI events so far today  Hct: 30  Fixed speed:  5600 Low speed limit:  5300  Drive Line:  Dressing changed this morning per VAD coordinator. Plan for bedside RN to change dressing this afternoon.   Existing VAD dressing removed and site care performed using sterile technique.  Drive line exit site cleaned with Chlora prep applicators x 2, allowed to dry. Pure and clean wound cleanser spray x 3 to wound bed per Dr Donata Clay. Wound packed with 2 saline moistened 4 x 4s, covered with several dry 4 x 4s. No redness, foul odor, or tenderness noted. Moderate of serosanguinous drainage on packing. Drive line anchor intact. Continue twice daily sterile wet to dry dressing  changes per bedside RN or VAD coordinator. Next dressing change due 07/16/22 afternoon.       Labs:  LDH trend: 249>244>208>22>229>199>210>189>163>160>147>157>147>145>169>149>122>143>152>119>124>126>135>123>127  INR trend: 1.3 >1.3>1.2>1.1>1.1>1.1>1.1>1.2>1.3>1.5>1.6>1.7>1.7>1.5>1.7>1.8>1.9>1.4>1.2>1.2>1.1>1.4>1.4  Anticoagulation Plan: -INR Goal: 2.0 - 2.5 -ASA Dose: none  ICD: N/A  Drips:  Heparin - 500 units/hr  Infection:  06/07/22>> repeat drive line culture>> abundant MRSA; final  Blood Products: - 06/21/22>> 2 PRBC   Plan/Recommendations:  Page VAD coordinators for equipment or drive line issues BID sterile wet to dry drive line site dressing changes per VAD coordinator or bedside RN  Alyce Pagan RN VAD Coordinator  Office: [phone]  24/7 Pager: [phone]

## 2022-07-16 NOTE — Plan of Care (Signed)
Problem: Education: Goal: Knowledge of General Education information will improve Description: Including pain rating scale, medication(s)/side effects and non-pharmacologic comfort measures 07/16/2022 2302 by Renn Dirocco, Gilford Raid, RN Outcome: Progressing 07/16/2022 2302 by Thea Gist, RN Outcome: Progressing   Problem: Health Behavior/Discharge Planning: Goal: Ability to manage health-related needs will improve 07/16/2022 2302 by Pio Eatherly, Gilford Raid, RN Outcome: Progressing 07/16/2022 2302 by Thea Gist, RN Outcome: Progressing   Problem: Clinical Measurements: Goal: Ability to maintain clinical measurements within normal limits will improve 07/16/2022 2302 by Shelia Magallon, Gilford Raid, RN Outcome: Progressing 07/16/2022 2302 by Thea Gist, RN Outcome: Progressing Goal: Will remain free from infection 07/16/2022 2302 by Tani Virgo, Gilford Raid, RN Outcome: Progressing 07/16/2022 2302 by Thea Gist, RN Outcome: Progressing Goal: Diagnostic test results will improve 07/16/2022 2302 by Christen Bedoya, Gilford Raid, RN Outcome: Progressing 07/16/2022 2302 by Thea Gist, RN Outcome: Progressing Goal: Respiratory complications will improve 07/16/2022 2302 by Lj Miyamoto, Gilford Raid, RN Outcome: Progressing 07/16/2022 2302 by Thea Gist, RN Outcome: Progressing Goal: Cardiovascular complication will be avoided 07/16/2022 2302 by Jiyah Torpey, Gilford Raid, RN Outcome: Progressing 07/16/2022 2302 by Thea Gist, RN Outcome: Progressing   Problem: Activity: Goal: Risk for activity intolerance will decrease 07/16/2022 2302 by Irine Heminger, Gilford Raid, RN Outcome: Progressing 07/16/2022 2302 by Thea Gist, RN Outcome: Progressing   Problem: Nutrition: Goal: Adequate nutrition will be maintained 07/16/2022 2302 by Eagle Pitta, Gilford Raid, RN Outcome: Progressing 07/16/2022 2302 by Thea Gist, RN Outcome: Progressing   Problem: Coping: Goal: Level of anxiety will decrease 07/16/2022  2302 by Nakayla Rorabaugh, Gilford Raid, RN Outcome: Progressing 07/16/2022 2302 by Thea Gist, RN Outcome: Progressing   Problem: Elimination: Goal: Will not experience complications related to bowel motility 07/16/2022 2302 by Rigel Filsinger, Gilford Raid, RN Outcome: Progressing 07/16/2022 2302 by Thea Gist, RN Outcome: Progressing Goal: Will not experience complications related to urinary retention 07/16/2022 2302 by Gracieann Stannard, Gilford Raid, RN Outcome: Progressing 07/16/2022 2302 by Thea Gist, RN Outcome: Progressing   Problem: Pain Managment: Goal: General experience of comfort will improve 07/16/2022 2302 by Karely Hurtado, Gilford Raid, RN Outcome: Progressing 07/16/2022 2302 by Thea Gist, RN Outcome: Progressing   Problem: Safety: Goal: Ability to remain free from injury will improve 07/16/2022 2302 by Chas Axel, Gilford Raid, RN Outcome: Progressing 07/16/2022 2302 by Thea Gist, RN Outcome: Progressing   Problem: Skin Integrity: Goal: Risk for impaired skin integrity will decrease 07/16/2022 2302 by Savas Elvin, Gilford Raid, RN Outcome: Progressing 07/16/2022 2302 by Thea Gist, RN Outcome: Progressing   Problem: Education: Goal: Ability to describe self-care measures that may prevent or decrease complications (Diabetes Survival Skills Education) will improve 07/16/2022 2302 by Thea Gist, RN Outcome: Progressing 07/16/2022 2302 by Thea Gist, RN Outcome: Progressing Goal: Individualized Educational Video(s) 07/16/2022 2302 by Thea Gist, RN Outcome: Progressing 07/16/2022 2302 by Thea Gist, RN Outcome: Progressing   Problem: Coping: Goal: Ability to adjust to condition or change in health will improve 07/16/2022 2302 by Amanuel Sinkfield, Gilford Raid, RN Outcome: Progressing 07/16/2022 2302 by Thea Gist, RN Outcome: Progressing   Problem: Fluid Volume: Goal: Ability to maintain a balanced intake and output will improve 07/16/2022 2302 by Riva Sesma,  Gilford Raid, RN Outcome: Progressing 07/16/2022 2302 by Thea Gist, RN Outcome: Progressing   Problem: Health Behavior/Discharge Planning: Goal: Ability to identify and utilize available resources and services will improve 07/16/2022 2302 by Divya Munshi, Gilford Raid, RN Outcome: Progressing 07/16/2022 2302 by Thea Gist,  RN Outcome: Progressing Goal: Ability to manage health-related needs will improve 07/16/2022 2302 by Jemaine Prokop, Gilford Raid, RN Outcome: Progressing 07/16/2022 2302 by Thea Gist, RN Outcome: Progressing   Problem: Metabolic: Goal: Ability to maintain appropriate glucose levels will improve 07/16/2022 2302 by Kesi Perrow, Gilford Raid, RN Outcome: Progressing 07/16/2022 2302 by Thea Gist, RN Outcome: Progressing   Problem: Nutritional: Goal: Maintenance of adequate nutrition will improve 07/16/2022 2302 by Tomica Arseneault, Gilford Raid, RN Outcome: Progressing 07/16/2022 2302 by Thea Gist, RN Outcome: Progressing Goal: Progress toward achieving an optimal weight will improve 07/16/2022 2302 by Genice Kimberlin, Gilford Raid, RN Outcome: Progressing 07/16/2022 2302 by Thea Gist, RN Outcome: Progressing   Problem: Skin Integrity: Goal: Risk for impaired skin integrity will decrease 07/16/2022 2302 by Ruari Mudgett, Gilford Raid, RN Outcome: Progressing 07/16/2022 2302 by Thea Gist, RN Outcome: Progressing   Problem: Tissue Perfusion: Goal: Adequacy of tissue perfusion will improve 07/16/2022 2302 by Flonnie Wierman, Gilford Raid, RN Outcome: Progressing 07/16/2022 2302 by Thea Gist, RN Outcome: Progressing

## 2022-07-16 NOTE — TOC Initial Note (Signed)
Transition of Care [facility]) - Initial/Assessment Note    Patient Details  Name: Kevin Underwood MRN: [id number] Date of Birth: [birth year]  [facility]) CM/SW Contact:    [name], RN Phone Number: [phone] 07/16/2022, 6:16 PM  Clinical Narrative:                  HF TOC CM spoke to pt at bedside. His wife will do the wet to dry dressings at home. Contacted [name] and [name] to make aware pt will not dc home with HH or IV abx.   Will request meds come up from [facility] pharmacy at dc.     Expected Discharge Plan: Home/Self Care Barriers to Discharge: Continued Medical Work up   Patient Goals and CMS Choice Patient states their goals for this hospitalization and ongoing recovery are:: get better CMS Medicare.gov Compare Post Acute Care list provided to:: Patient Choice offered to / list presented to : Patient  Expected Discharge Plan and Services Expected Discharge Plan: Home/Self Care   Discharge Planning Services: CM Consult Post Acute Care Choice: Home Health Living arrangements for the past 2 months: Single Family Home                           HH Arranged: RN HH Agency: Therapist, [facility]) Date [facility] Agency Contacted: 06/13/22 Time HH Agency Contacted: 1537 Representative spoke with at [facility] Agency: [name]  Prior Living Arrangements/Services Living arrangements for the past 2 months: Single Family Home Lives with:: Spouse Patient language and need for interpreter reviewed:: Yes Do you feel safe going back to the place where you live?: Yes      Need for Family Participation in Patient Care: Yes (Comment) Care giver support system in place?: Yes (comment) Current home services:  (LVAD, nebulizer machine)    Activities of Daily Living Home Assistive Devices/Equipment: Blood pressure cuff, Other (Comment) (LVAD equipement) ADL Screening (condition at time of admission) Patient's cognitive ability adequate to safely complete  daily activities?: Yes Is the patient deaf or have difficulty hearing?: No Does the patient have difficulty seeing, even when wearing glasses/contacts?: No Does the patient have difficulty concentrating, remembering, or making decisions?: No Patient able to express need for assistance with ADLs?: No Does the patient have difficulty dressing or bathing?: No Independently performs ADLs?: Yes (appropriate for developmental age) Does the patient have difficulty walking or climbing stairs?: No Weakness of Legs: None Weakness of Arms/Hands: None  Permission Sought/Granted Permission sought to share information with : Case Manager, Family Supports, PCP Permission granted to share information with : Yes, Verbal Permission Granted  Share Information with NAME: Drago Hammonds  Permission granted to share info w AGENCY: Home Health, DME  Permission granted to share info w Relationship: wife  Permission granted to share info w Contact Information: [phone]  Emotional Assessment Appearance:: Appears stated age Attitude/Demeanor/Rapport: Engaged Affect (typically observed): Accepting Orientation: : Oriented to Self, Oriented to Place, Oriented to  Time, Oriented to Situation   Psych Involvement: No (comment)  Admission diagnosis:  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 (LVAD) (HCC) [M76.7XXA] Patient Active Problem List   Diagnosis Date Noted   MRSA infection    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 (LVAD) (HCC)    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) 05/13/2022   AKI (acute kidney injury) (HCC) 05/13/2022   Hyperthyroidism 01/22/2022   Chronic  systolic heart failure (HCC)    COPD (chronic obstructive pulmonary disease) (HCC) 09/09/2021   Malnutrition of moderate degree 11/17/2020   CHF (congestive heart failure), NYHA class IV, chronic, systolic (HCC) 11/17/2020   Palliative care by specialist     Goals of care, counseling/discussion    Hyperlipidemia    CHF exacerbation (HCC) 09/23/2020   Unstable angina (HCC) 09/22/2020   LVAD (left ventricular assist device) present (HCC)    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   Cardiogenic shock (HCC)    Acute respiratory failure (HCC)    RUQ abdominal pain    Cardiomyopathy (HCC) 11/21/2019   Acute systolic CHF (congestive heart failure) (HCC) 11/21/2019   History of alcohol abuse Quit 6 mos ago 11/21/2019   History of recreational drug use 11/21/2019   PAF (paroxysmal atrial fibrillation) (HCC) 11/21/2019   Cardiac volume overload 11/21/2019   Elevated brain natriuretic peptide (BNP) level 11/21/2019   Leg edema, right 11/21/2019   Hyponatremia 11/21/2019   Abnormal electrocardiogram (ECG) (EKG) 11/21/2019   Hyperglycemia 11/21/2019   Tremulousness 11/21/2019   Restlessness and agitation 11/21/2019   OSA (obstructive sleep apnea) 11/21/2019   Atrial fibrillation with RVR (HCC) 11/20/2019   Acute congestive heart failure (HCC) 11/20/2019   Acute respiratory distress 11/20/2019   COPD with acute exacerbation (HCC)    Hypertension    Type 2 diabetes mellitus (HCC)    Closed fracture of 5th metacarpal 08/31/2012   PCP:  [name], FNP Pharmacy:   [id number] - [place], [place] - [address] AT [facility] [address] [place] [address] Phone: [phone] Fax: [phone]     Social Determinants of Health (SDOH) Interventions    Readmission Risk Interventions    07/01/2022    9:05 AM 11/28/2020   11:57 AM  Readmission Risk Prevention Plan  Transportation Screening Complete Complete  Medication Review (RN Care Manager) Complete Complete  PCP or Specialist appointment within 3-5 days of discharge Complete   HRI or Home Care Consult Complete Complete  SW Recovery Care/Counseling Consult Complete Complete  Palliative Care Screening Not Applicable Not Applicable  Skilled Nursing Facility Not  Applicable Not Applicable

## 2022-07-16 NOTE — Progress Notes (Addendum)
[facility] Infectious Diseases Follow Up Note  Patient Identification: Patient Name: Kevin Underwood MRN: [id number] Admit Date: 06/12/2022 11:02 AM Age: 61 y.o.Today's Date: 07/16/2022  Reason for Visit: Driveline infection   Principal Problem:   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 (LVAD) (HCC)   Current Antibiotics:  Daptomycin 7/5-c  Lines/Hardwares: LVAD , PICC Rt UE  Interval Events: continues to be afebrile, no leukocytosis.    Assessment 61 YM with VAD placed in December 2021 c/b MRSA driveline infection SP I&D x2 on daptomycin(N/V with vancomycin) EOT 7/13 admitted for driveline infections.   #LVAD driveline infection with MRSA -recent admission 6/5-6/26 for LVAD driveline infection. He underwent I&D x 2 (6/8 and 6/23). Intraop Cxfrom 6/8 grew MRSA. ID engaged and pt placed on 4 week sof vancomycin EOT 7/13. He then developed N/V, switched to daptomycin. Returned to OR on 6/23 as pt had persistent exudate. S/p serial OR visits for debridements 7/7, 7/13 (some milky fluid that was expressed from the space),  7/20 and 7/31. OR findings 7/31  " The wound has started to granulate nicely with incorporation of the previously placed tissue matrix for up to 80% of the wound.  The most medial 20% needed some excisional debridement and removal of the lower coating of the power  cord."     Small "knot" adjacent to driveline exit site on 8/5. Evaluated by CTVS 8/7  with no concerns,  no VAC needed, no plan for repeat OR. Plan for BID dressing saline wet/dry while in hospital and daily dressing at home. Per patient, reported knot is decreasing in size with no concerns ( 8/5 picture in chart seen). Per dressing change by VAD coordinator  today- no redness, foul odor, or tenderness noted. Moderate of serosanguinous drainage on packing. Drive line anchor intact.    Recommendations Continue Vancomycin, pharmacy  to dose until 8/10 then switch to PO doxycyline thereafter for suppression  Monitor CBC, BMP, Vancomycin trough  Cardiology working on attaining desired INR goal INR >1.7. 1.4 today  Patient has a fu appointment made with Dr Luciana Axe on 8/16 at 9:30 am D/w ID pharmacy and patient/Wife ID will sign off. Recall if needed.   Rest of the management as per the primary team. Thank you for the consult. Please page with pertinent questions or concerns.  ______________________________________________________________________ Subjective patient seen and examined at the bedside. No complaints  Knot noted near drive line site decreasing with no concerns   Vitals BP (!) 84/56 (BP Location: Left Arm)   Pulse 74   Temp 98 F (36.7 C) (Oral)   Resp 17   Ht 5\' 10"  (1.778 m)   Wt 89.7 kg   SpO2 100%   BMI 28.37 kg/m    Physical Exam Constitutional:  sitting up in the chair and appears comfortable     Comments:   Cardiovascular:     Rate and Rhythm: mechanical heart sounds and humming sound of LVAD    Heart sounds  Pulmonary:     Effort: Pulmonary effort is normal.     Comments:   Abdominal:     Palpations: Abdomen is soft.     Tenderness: LVAD exit site covered with dressing C/D/I ( picture seem, wound vac removed)  Musculoskeletal:        General: No swelling or tenderness.   Skin:    Comments: RT UE PICC  Neurological:     General: No focal deficit present. Awake, alert and oriented   Psychiatric:  Mood and Affect: Mood normal.     Pertinent Microbiology Results for orders placed or performed during the hospital encounter of 06/12/22  Surgical PCR screen     Status: None   Collection Time: 06/12/22 12:30 PM   Specimen: Nasal Mucosa; Nasal Swab  Result Value Ref Range Status   MRSA, PCR NEGATIVE NEGATIVE Final   Staphylococcus aureus NEGATIVE NEGATIVE Final    Comment: (NOTE) The Xpert SA Assay (FDA approved for NASAL specimens in patients 8 years of age and  older), is one component of a comprehensive surveillance program. It is not intended to diagnose infection nor to guide or monitor treatment. Performed at [facility], [address], [place], [place] [address]       Pertinent Lab.    Latest Ref Rng & Units 07/16/2022    3:20 AM 07/15/2022   12:54 AM 07/13/2022    3:35 AM  CBC  WBC 4.0 - 10.5 K/uL 7.2  6.1  8.3   Hemoglobin 13.0 - 17.0 g/dL 8.7  8.3  9.1   Hematocrit 39.0 - 52.0 % 26.1  25.5  28.3   Platelets 150 - 400 K/uL 185  177  185       Latest Ref Rng & Units 07/16/2022    3:20 AM 07/15/2022   12:54 AM 07/14/2022   12:52 AM  CMP  Glucose 70 - 99 mg/dL 800  349  179   BUN 8 - 23 mg/dL 27  29  26    Creatinine 0.61 - 1.24 mg/dL  1.50  5.69   Sodium 135 - 145 mmol/L 136  138  137   Potassium 3.5 - 5.1 mmol/L 3.7  3.9  4.0   Chloride 98 - 111 mmol/L 105  108  107   CO2 22 - 32 mmol/L 25  22  23    Calcium 8.9 - 10.3 mg/dL 8.7  8.7  8.8      Pertinent Imaging today Plain films and CT images have been personally visualized and interpreted; radiology reports have been reviewed. Decision making incorporated into the Impression / Recommendations. No results found.  I spent 50  minutes for this patient encounter including review of prior medical records, coordination of care with primary/other specialist with greater than 50% of time being face to face/counseling and discussing diagnostics/treatment plan with the patient/family.  Electronically signed by:   7.94, MD Infectious Disease Physician [facility] Pager: [phone]

## 2022-07-16 NOTE — Progress Notes (Addendum)
LVAD dressing change using sterile technique per orders

## 2022-07-16 NOTE — Plan of Care (Signed)
  Problem: Education: Goal: Knowledge of General Education information will improve Description: Including pain rating scale, medication(s)/side effects and non-pharmacologic comfort measures Outcome: Progressing   Problem: Health Behavior/Discharge Planning: Goal: Ability to manage health-related needs will improve Outcome: Progressing   Problem: Clinical Measurements: Goal: Ability to maintain clinical measurements within normal limits will improve Outcome: Progressing Goal: Will remain free from infection Outcome: Progressing Goal: Diagnostic test results will improve Outcome: Progressing Goal: Respiratory complications will improve Outcome: Progressing Goal: Cardiovascular complication will be avoided Outcome: Progressing   Problem: Activity: Goal: Risk for activity intolerance will decrease Outcome: Progressing   Problem: Nutrition: Goal: Adequate nutrition will be maintained Outcome: Progressing   Problem: Coping: Goal: Level of anxiety will decrease Outcome: Progressing   Problem: Elimination: Goal: Will not experience complications related to bowel motility Outcome: Progressing Goal: Will not experience complications related to urinary retention Outcome: Progressing   Problem: Pain Managment: Goal: General experience of comfort will improve Outcome: Progressing   Problem: Safety: Goal: Ability to remain free from injury will improve Outcome: Progressing   Problem: Skin Integrity: Goal: Risk for impaired skin integrity will decrease Outcome: Progressing   Problem: Education: Goal: Ability to describe self-care measures that may prevent or decrease complications (Diabetes Survival Skills Education) will improve Outcome: Progressing Goal: Individualized Educational Video(s) Outcome: Progressing   Problem: Coping: Goal: Ability to adjust to condition or change in health will improve Outcome: Progressing   Problem: Fluid Volume: Goal: Ability to  maintain a balanced intake and output will improve Outcome: Progressing   Problem: Health Behavior/Discharge Planning: Goal: Ability to identify and utilize available resources and services will improve Outcome: Progressing Goal: Ability to manage health-related needs will improve Outcome: Progressing   Problem: Metabolic: Goal: Ability to maintain appropriate glucose levels will improve Outcome: Progressing   Problem: Nutritional: Goal: Maintenance of adequate nutrition will improve Outcome: Progressing Goal: Progress toward achieving an optimal weight will improve Outcome: Progressing   Problem: Skin Integrity: Goal: Risk for impaired skin integrity will decrease Outcome: Progressing   Problem: Tissue Perfusion: Goal: Adequacy of tissue perfusion will improve Outcome: Progressing   Problem: Education: Goal: Ability to demonstrate management of disease process will improve Outcome: Progressing Goal: Ability to verbalize understanding of medication therapies will improve Outcome: Progressing Goal: Individualized Educational Video(s) Outcome: Progressing   Problem: Activity: Goal: Capacity to carry out activities will improve Outcome: Progressing   Problem: Cardiac: Goal: Ability to achieve and maintain adequate cardiopulmonary perfusion will improve Outcome: Progressing   Problem: Education: Goal: Patient will understand all VAD equipment and how it functions Outcome: Progressing Goal: Patient will be able to verbalize current INR target range and antiplatelet therapy for discharge home Outcome: Progressing   Problem: Cardiac: Goal: LVAD will function as expected and patient will experience no clinical alarms Outcome: Progressing   

## 2022-07-17 LAB — BASIC METABOLIC PANEL
Anion gap: 10 (ref 5–15)
BUN: 32 mg/dL — ABNORMAL HIGH (ref 8–23)
CO2: 23 mmol/L (ref 22–32)
Calcium: 8.8 mg/dL — ABNORMAL LOW (ref 8.9–10.3)
Chloride: 106 mmol/L (ref 98–111)
Creatinine, Ser: 1.31 mg/dL — ABNORMAL HIGH (ref 0.61–1.24)
GFR, Estimated: >60 mL/min (ref >60)
Glucose, Bld: 137 mg/dL — ABNORMAL HIGH (ref 70–99)
Potassium: 3.9 mmol/L (ref 3.5–5.1)
Sodium: 139 mmol/L (ref 135–145)

## 2022-07-17 LAB — CBC
HCT: 27.6 % — ABNORMAL LOW (ref 39.0–52.0)
Hemoglobin: 8.8 g/dL — ABNORMAL LOW (ref 13.0–17.0)
MCH: 28.7 pg (ref 26.0–34.0)
MCHC: 31.9 g/dL (ref 30.0–36.0)
MCV: 89.9 fL (ref 80.0–100.0)
Platelets: 199 10*3/uL (ref 150–400)
RBC: 3.07 MIL/uL — ABNORMAL LOW (ref 4.22–5.81)
RDW: 18.8 % — ABNORMAL HIGH (ref 11.5–15.5)
WBC: 7.3 10*3/uL (ref 4.0–10.5)
nRBC: 0 % (ref 0.0–0.2)

## 2022-07-17 LAB — PROTIME-INR
INR: 1.5 — ABNORMAL HIGH (ref 0.8–1.2)
Prothrombin Time: 17.9 seconds — ABNORMAL HIGH (ref 11.4–15.2)

## 2022-07-17 LAB — MAGNESIUM: Magnesium: 1.8 mg/dL (ref 1.7–2.4)

## 2022-07-17 LAB — GLUCOSE, CAPILLARY
Glucose-Capillary: 132 mg/dL — ABNORMAL HIGH (ref 70–99)
Glucose-Capillary: 191 mg/dL — ABNORMAL HIGH (ref 70–99)
Glucose-Capillary: 105 mg/dL — ABNORMAL HIGH (ref 70–99)
Glucose-Capillary: 197 mg/dL — ABNORMAL HIGH (ref 70–99)

## 2022-07-17 LAB — LACTATE DEHYDROGENASE: LDH: 131 U/L (ref 98–192)

## 2022-07-17 LAB — HEPARIN LEVEL (UNFRACTIONATED): Heparin Unfractionated: <0.1 IU/mL — ABNORMAL LOW (ref 0.30–0.70)

## 2022-07-17 MED ORDER — WARFARIN SODIUM 5 MG PO TABS
10.0000 mg | ORAL_TABLET | Freq: Once | ORAL | Status: AC
  Administered 2022-07-17: 10 mg via ORAL
  Filled 2022-07-17: qty 2

## 2022-07-17 MED ORDER — MAGNESIUM SULFATE 2 GM/50ML IV SOLN
2.0000 g | Freq: Once | INTRAVENOUS | Status: AC
  Administered 2022-07-17: 2 g via INTRAVENOUS
  Filled 2022-07-17: qty 50

## 2022-07-17 MED ORDER — BOOST PO LIQD: 237.0000 mL | Freq: Two times a day (BID) | ORAL | Status: DC | PRN

## 2022-07-17 NOTE — Progress Notes (Signed)
Pharmacy Antibiotic Note  Kevin Underwood is a 61 y.o. male admitted on 06/12/2022 with LVAD Driveline infection s/p debridement  7/7 , 7/13 and 7/20.Noted patient was on Daptomycin with CK increased up to 1000, pt transitioned back to vancomycin.   Cr remains fairly stable, no further plans for OR.  Plan: -Continue vancomycin 1250mg  IV q24h through 8/10, then lifelong suppression with doxycycline.  Height: 5\' 10"  (177.8 cm) Weight: 89.7 kg (197 lb 12 oz) IBW/kg (Calculated) : 73  Temp (24hrs), Avg:98.3 F (36.8 C), Min:97.6 F (36.4 C), Max:98.6 F (37 C)  Recent Labs  Lab 07/12/22 0505 07/13/22 0335 07/14/22 0052 07/15/22 0054 07/16/22 0320 07/17/22 0455  WBC 7.8 8.3  --  6.1 7.2 7.3  CREATININE 1.20 1.20 1.16 1.37* 1.20 1.31*     Estimated Creatinine Clearance: 66.8 mL/min (A) (by C-G formula based on SCr of 1.31 mg/dL (H)).    No Known Allergies  09/15/22, 09/16/22, [facility] Clinical Pharmacist  07/17/2022 12:55 PM   [facility] pharmacy phone numbers are listed on [email or web address]

## 2022-07-17 NOTE — Progress Notes (Signed)
1.5 year Intermacs follow up completed including:  Quality of Life, KCCQ-12, and Neurocognitive trail making.   No 6 MW today. Pt walking outside multiple times a day.    Patient Goals: To keep drive line infection under control, and remain out of the hospital.   [facility] Cardiomyopathy Questionnaire  KCCQ-12    1 a. Ability to shower/bathe Moderately limited   1 b. Ability to walk 1 block Not at all   1 c. Ability to hurry/jog Not at all   2. Edema feet/ankles/legs Never over the past 2 weeks   3. Limited by fatigue Never over the past 2 weeks   4. Limited by dyspnea Never over the past 2 weeks   5. Sitting up / on 3+ pillows Never over the past 2 weeks   6. Limited enjoyment of life Extremely limited   7. Rest of life w/ symptoms Not at all satisfied   8 a. Participation in hobbies Severely limited   8 b. Participation in chores Moderately limited   8 c. Visiting family/friends Slightly limited    Alyce Pagan RN VAD Coordinator  Office: [phone]  24/7 Pager: [phone]

## 2022-07-17 NOTE — Progress Notes (Signed)
ANTICOAGULATION CONSULT NOTE  Pharmacy Consult for warfarin + heparin Indication:  LVAD  No Known Allergies  Patient Measurements: Height: 5\' 10"  (177.8 cm) Weight: 89.7 kg (197 lb 12 oz) IBW/kg (Calculated) : 73 Heparin Dosing Weight: 80kg  Vital Signs: Temp: 98.1 F (36.7 C) (08/09 1159) Temp Source: Axillary (08/09 1159) BP: 101/82 (08/09 1159) Pulse Rate: 62 (08/09 0301)  Labs: Recent Labs    07/15/22 0054 07/16/22 0320 07/17/22 0455  HGB 8.3* 8.7* 8.8*  HCT 25.5* 26.1* 27.6*  PLT 177 185 199  LABPROT 16.9* 16.6* 17.9*  INR 1.4* 1.4* 1.5*  HEPARINUNFRC <0.10* <0.10* <0.10*  CREATININE 1.37* 1.20 1.31*     Estimated Creatinine Clearance: 66.8 mL/min (A) (by C-G formula based on SCr of 1.31 mg/dL (H)).   Medical History: Past Medical History:  Diagnosis Date   "    Arthritis    CAD (coronary artery disease)    a. s/p CABG in 11/2019 with LIMA-LAD, SVG-OM1, SVG-PDA and SVG-D1   CHF (congestive heart failure) (HCC)    a. EF < 20% by echo in 11/2019   Essential hypertension    PAF (paroxysmal atrial fibrillation) (HCC)    Type 2 diabetes mellitus (HCC)     heparin 500 Units/hr (07/17/22 0516)   magnesium sulfate bolus IVPB 2 g (07/17/22 1158)   vancomycin Stopped (07/16/22 2353)     Assessment: 61 yoM with HM3 LVAD admitted with ongoing LVAD driveline infection. Pt with planned debridements in OR. Pt is on warfarin PTA.  Pt s/p multiple debridements for wound infection, last 7/31. Per Dr. 8/31 no additional debridements planned.  INR today remains subtherapeutic at 1.5, CBC and LDH stable. Heparin level <0.1 as expected.   INR slow to rise despite doses of Coumadin higher than home doses.  Pt getting Boost nutritional supplements TID which may be decreasing effect of Coumadin due to vit K content (although 3 shakes a day = 0.9 mg vitamin K)  *Home warfarin dose 6mg  MWF, 4mg  AODs  Goal of Therapy:  INR goal 2-2.5 Heparin level < 0.3  units/ml Monitor platelets by anticoagulation protocol: Yes   Plan:  Continue heparin 500 units/h  Give larger warfarin dose of  10 mg x1 today  Daily INR, heparin level, CBC, LDH  Thank you for allowing pharmacy to participate in this patient's care.  Donata Clay, , BCCP Clinical Pharmacist  07/17/2022 12:49 PM   [facility] pharmacy phone numbers are listed on [email or web address]

## 2022-07-17 NOTE — Plan of Care (Signed)
  Problem: Education: Goal: Knowledge of General Education information will improve Description: Including pain rating scale, medication(s)/side effects and non-pharmacologic comfort measures Outcome: Progressing   Problem: Health Behavior/Discharge Planning: Goal: Ability to manage health-related needs will improve Outcome: Progressing   Problem: Clinical Measurements: Goal: Ability to maintain clinical measurements within normal limits will improve Outcome: Progressing Goal: Will remain free from infection Outcome: Progressing Goal: Diagnostic test results will improve Outcome: Progressing Goal: Respiratory complications will improve Outcome: Progressing Goal: Cardiovascular complication will be avoided Outcome: Progressing   Problem: Activity: Goal: Risk for activity intolerance will decrease Outcome: Progressing   Problem: Nutrition: Goal: Adequate nutrition will be maintained Outcome: Progressing   Problem: Coping: Goal: Level of anxiety will decrease Outcome: Progressing   Problem: Elimination: Goal: Will not experience complications related to bowel motility Outcome: Progressing Goal: Will not experience complications related to urinary retention Outcome: Progressing   Problem: Pain Managment: Goal: General experience of comfort will improve Outcome: Progressing   Problem: Safety: Goal: Ability to remain free from injury will improve Outcome: Progressing   Problem: Skin Integrity: Goal: Risk for impaired skin integrity will decrease Outcome: Progressing   Problem: Education: Goal: Ability to describe self-care measures that may prevent or decrease complications (Diabetes Survival Skills Education) will improve Outcome: Progressing Goal: Individualized Educational Video(s) Outcome: Progressing   Problem: Coping: Goal: Ability to adjust to condition or change in health will improve Outcome: Progressing   Problem: Fluid Volume: Goal: Ability to  maintain a balanced intake and output will improve Outcome: Progressing   Problem: Health Behavior/Discharge Planning: Goal: Ability to identify and utilize available resources and services will improve Outcome: Progressing Goal: Ability to manage health-related needs will improve Outcome: Progressing   Problem: Metabolic: Goal: Ability to maintain appropriate glucose levels will improve Outcome: Progressing   Problem: Nutritional: Goal: Maintenance of adequate nutrition will improve Outcome: Progressing Goal: Progress toward achieving an optimal weight will improve Outcome: Progressing   Problem: Skin Integrity: Goal: Risk for impaired skin integrity will decrease Outcome: Progressing   Problem: Tissue Perfusion: Goal: Adequacy of tissue perfusion will improve Outcome: Progressing   Problem: Education: Goal: Ability to demonstrate management of disease process will improve Outcome: Progressing Goal: Ability to verbalize understanding of medication therapies will improve Outcome: Progressing Goal: Individualized Educational Video(s) Outcome: Progressing

## 2022-07-17 NOTE — Progress Notes (Addendum)
Patient ID: Kevin Underwood, male   DOB: 1960-12-19, 61 y.o.   MRN: [id number]   Advanced Heart Failure VAD Team Note  PCP-Cardiologist: Nona Dell, MD   Subjective:    S/p driveline site debridement and wound vac on 7/7.  7/10 Had Ramp ECHO. Speed optimized. 7/13 OR to VAC change.  Back on Heparin drip.  7/20 OR for debridement and VAC change 7/31 OR for debriedment and VAC change  On heparin. INR 1.5. He has been drinking 3 Boost a day.    Denies SOB. No pain.   LVAD INTERROGATION:  HeartMate III LVAD:   Flow 4.2 liters/min, speed 5600, power 4 PI 3.2 . VAD interrogated personally. Parameters stable.  Objective:    Vital Signs:   Temp:  [98.3 F (36.8 C)-98.6 F (37 C)] 98.6 F (37 C) (08/09 0301) Pulse Rate:  [62-91] 62 (08/09 0301) Resp:  [11-18] 12 (08/09 0301) BP: (94-118)/(74-99) 94/74 (08/09 0301) SpO2:  [96 %-98 %] 96 % (08/09 0301) Last BM Date : 07/16/22 Mean arterial Pressure  80s  Intake/Output:   Intake/Output Summary (Last 24 hours) at 07/17/2022 0726 Last data filed at 07/17/2022 0516 Gross per 24 hour  Intake 365.57 ml  Output --  Net 365.57 ml     Physical Exam  Physical Exam: GENERAL: No acute distress. Sitting in the chair HEENT: normal  NECK: Supple, JVP  5-6 .  2+ bilaterally, no bruits.  No lymphadenopathy or thyromegaly appreciated.   CARDIAC:  Mechanical heart sounds with LVAD hum present.  LUNGS:  Clear to auscultation bilaterally.  ABDOMEN:  Soft, round, nontender, positive bowel sounds x4.     LVAD exit site: Dressing around driveline.   EXTREMITIES:  Warm and dry, no cyanosis, clubbing, rash or edema . RUE PICC  NEUROLOGIC:  Alert and oriented x 3.    No aphasia.  No dysarthria.  Affect pleasant.     Telemetry   SR 70s personally checked   Labs   Basic Metabolic Panel: Recent Labs  Lab 07/13/22 0335 07/14/22 0052 07/15/22 0054 07/15/22 0842 07/16/22 0320 07/17/22 0455  NA 135 137 138  --  136 139  K 3.8 4.0 3.9  --   3.7 3.9  CL 102 107 108  --  105 106  CO2 25 23 22   --  25 23  GLUCOSE 162* 132* 181*  --  161* 137*  BUN 28* 26* 29*  --  27* 32*  CREATININE 1.20 1.16 1.37*  --  1.20 1.31*  CALCIUM 9.1 8.8* 8.7*  --  8.7* 8.8*  MG  --   --   --  1.8  --  1.8    Liver Function Tests: No results for input(s): "AST", "ALT", "ALKPHOS", "BILITOT", "PROT", "ALBUMIN" in the last 168 hours.  No results for input(s): "LIPASE", "AMYLASE" in the last 168 hours. No results for input(s): "AMMONIA" in the last 168 hours.  CBC: Recent Labs  Lab 07/12/22 0505 07/13/22 0335 07/15/22 0054 07/16/22 0320 07/17/22 0455  WBC 7.8 8.3 6.1 7.2 7.3  HGB 10.0* 9.1* 8.3* 8.7* 8.8*  HCT 30.2* 28.3* 25.5* 26.1* 27.6*  MCV 87.3 88.7 88.2 87.6 89.9  PLT 192 185 177 185 199    INR: Recent Labs  Lab 07/13/22 0335 07/14/22 0052 07/15/22 0054 07/16/22 0320 07/17/22 0455  INR 1.3* 1.4* 1.4* 1.4* 1.5*    Other results:   Imaging   No results found.   Medications:     Scheduled Medications:  allopurinol  100 mg Oral Daily   Chlorhexidine Gluconate Cloth  6 each Topical Daily   docusate sodium  200 mg Oral BID   [START ON 07/19/2022] doxycycline  100 mg Oral Q12H   gabapentin  600 mg Oral BID   guaiFENesin  600 mg Oral BID   hydrALAZINE  25 mg Oral TID   insulin aspart  0-15 Units Subcutaneous TID WC   insulin aspart  0-5 Units Subcutaneous QHS   insulin aspart  3 Units Subcutaneous TID WC   insulin detemir  15 Units Subcutaneous QHS   lactose free nutrition  237 mL Oral TID WC   loratadine  10 mg Oral Daily   methimazole  20 mg Oral BID   metoprolol succinate  25 mg Oral Daily   pantoprazole  40 mg Oral Daily   polyethylene glycol  17 g Oral Daily   rosuvastatin  20 mg Oral Daily   sertraline  50 mg Oral Daily   simethicone  80 mg Oral QID   tamsulosin  0.4 mg Oral Daily   traZODone  150 mg Oral QHS   Warfarin - Pharmacist Dosing Inpatient   Does not apply q1600    Infusions:  heparin 500  Units/hr (07/17/22 0516)   vancomycin Stopped (07/16/22 2353)    PRN Medications: acetaminophen, albuterol, morphine injection, ondansetron (ZOFRAN) IV, mouth rinse, [DISCONTINUED] oxyCODONE **OR** oxyCODONE, sorbitol, traMADol   Patient Profile   61 y.o. male with history of HM III LVAD, chronic systolic CHF/ICM, COPD, PAF. 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   Readmitted for driveline infection and surgical debridement/possible wound vac placement.  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 S/p driveline site debridement 6/8. Surgical wound cultures grew MRSA, repeat debridement 6/23. Driveline debridement with wound vac placement on 7/7. 7/13, 7/20 VAC dressing change. Went back to OR again on 7/31 for debridement and VAC change - Transitioned from daptomycin to vancomycin due to elevated CK.  Vancomycin planned x 6 wks, end date 07/18/22 then will switch doxycycline 100 mg twice a day for chronic suppression.  - Remove PICC prior to discharge.   - INR 1.5 today. Asked him to stop drinking Boost. Vitamin K Boost may be delaying rise in INR. Heparin restarted post-op (heparin for INR < 1.8). Plan to d/c home once INR >1.7 and abx course completed.  - Plan to stabilize INR better and he can probably go home later this week per CTS. Keep sterile wet-dry dressings with changed BID. 2.  Chronic systolic CHF: Echo 10/21 with EF 20-25%, mildly decreased RV function. LHC/RHC in 12/21 with patent grafts, low output. Suspect mixed ischemic/nonischemic cardiomyopathy (prior heavy ETOH and drugs as well as CAD).  No ETOH, drugs, smoking since CABG in 12/20. Admitted with cardiogenic shock in 12/21, had placement of Impella 5.5 initially, now s/p Heartmate 3 LVAD on 11/17/20.  Ramp echo 2/23 with speed decreased to 5500 rpm. He would eventually like heart transplant. NYHA class II. Have been allowing MAP to run higher due to  recent AKI thought to be due to lowering BP too much in setting of right renal artery stenosis. Had Ramp ECHO with speed optimized.  - Volume status stable. Maps ok.   - INR 1.5. continue heparin gtt/warfarin. See above.  - Continue Toprol XL 25 mg daily.  - Continue hydralazine 25 mg tid.  - Renal function stable.  3. AKI: Creatinine up to 4.6 last admission, initially  thought this was ATN from low BP but he was never markedly hypotensive and LFTs without any evidence of shock liver. ? Vanc toxicity (troughs were ok). Taken off Entresto and hydralazine. Volume repleted with IVF. In reviewing prior CTA abdomen/pelvis, he has high-grade stenosis of proximal right renal artery. D/w Renal and feel like he may need higher perfusion pressures.  Creatinine stable 1.3.  MAPs in the 80s.  - Would consider reassessment of renal artery stenosis and possible treatment once creatinine has improved to baseline.  4.  CAD: S/p CABG 12/20.  LHC pre-VAD with patent grafts, no target for intervention. - Continue statin.  5.  Atrial fibrillation: Paroxysmal.  S/p Maze and LA appendage clip with CABG in 12/20. Maintaining SR.  - Continue Toprol XL 25 mg daily.  - Off amiodarone with hyperthyroidism. 6.  Type 2 diabetes: Takes glipizide at home.  - SSI  - Continue Levemir 15 units qHS - Diabetes coordinator following 7. 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  8. 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  He missed his endocrinology appt. Had been rescheduled for July 27th, will need to reschedule again. - He is now off amiodarone.   - Continue methimazole 20 mg bid, TSH was mildly elevated.   9.  Gout: On allopurinol 10. Anemia (chronic blood loss): Got 1u RBCs on 7/12. Hgb 8.8 today  11. NSVT: 4 beats 8/7.  Watch K/Mag. Give 2 grams mag today.   I reviewed the LVAD parameters from today, and compared the results to the patient's prior recorded data.  No programming  changes were made.  The LVAD is functioning within specified parameters.  The patient performs LVAD self-test daily.  LVAD interrogation was negative for any significant power changes, alarms or PI events/speed drops.  LVAD equipment check completed and is in good working order.  Back-up equipment present.   LVAD education done on emergency procedures and precautions and reviewed exit site care.  Length of Stay: [address] AGACNP-BC  07/17/2022  Patient seen with NP, agree with the above note.   INR 1.5 today, no complaints.  He remains on IV vancomycin.   General: Well appearing this am. NAD.  HEENT: Normal. Neck: Supple, JVP 7-8 cm. Carotids OK.  Cardiac:  Mechanical heart sounds with LVAD hum present.  Lungs:  CTAB, normal effort.  Abdomen:  NT, ND, no HSM. No bruits or masses. +BS  LVAD exit site: Site dressed.  Extremities:  Warm and dry. No cyanosis, clubbing, rash, or edema.  Neuro:  Alert & oriented x 3. Cranial nerves grossly intact. Moves all 4 extremities w/o difficulty. Affect pleasant    He will get his last dose of IV vancomycin tomorrow, will aim for home after that as long as INR > 1.7.   Kevin Underwood 07/17/2022 8:28 AM

## 2022-07-17 NOTE — Progress Notes (Addendum)
LVAD Coordinator Rounding Note:  Admitted 06/12/22 to Dr. Alford Highland service due to drive line debridement scheduled with Dr Donata Clay 06/14/22.   S/p driveline site debridement and wound vac on 7/7.  7/10 Had Ramp ECHO. Speed optimized. 7/13 OR to [facility] dressing change.  Back on Heparin drip.  7/20 OR for debridement and VAC dressing change 7/31 OR for debridement and VAC change  Pt sitting up in recliner this morning.  Denies complaints. He is excited that he will be discharging home soon.   Plan for BID sterile wet to dry drive line site dressing changes while hospitalized. Pt's wife Kevin Underwood performed dressing change this morning with VAD coordinator supervision and  minimal verbal cues.   Transitioned from daptomycin to vancomycin due to elevated CK. Vancomycin planned x 6 weeks; end date 07/18/22. Will plan to pull PICC prior to discharge. Start Doxycycline 8/11.  Pt has adequate dressing kits, saline, and 4 x 4s at home from previous admission. Provided with extra tape and cotton tip applicators today for home use.   Appt w/[facility] rescheduled for Wednesday 08/07/22 at 1pm. Pt's wife Kevin Underwood made aware of appt information. Also provided office address and phone number.   Pt and Tracey aware of follow up appts in VAD clinic:   Dressing change with Dr Zenaida Niece Trigt: 07/22/22 @ 0830  Hospital d/c f/u and dressing change with Dr Zenaida Niece Trigt: 07/29/22 @ 0830  Vital signs: Temp:  97.6 HR: 81 Doppler Pressure: 98 Automatic BP: 106/66 (78) O2 Sat: 94% on RA Wt: 171.7>174.6>178.6>182.9>181>...189.1>189.1>190.9>192>190.7>195.3>198.5>194>195.9>194.8>197.6 lbs   LVAD interrogation reveals:  Speed: 5600 Flow:  4.5 Power: 4.0 w PI: 5.0 Alarms: none Events: on batteries Hct: 30  Fixed speed:  5600 Low speed limit:  5300  Drive Line:  Dressing changed this morning by pt's wife with VAD coordinator supervision. She needed minimal verbal cues. Plan for bedside RN to change  dressing this afternoon.   Existing VAD dressing removed and site care performed using sterile technique.  Drive line exit site cleaned with Chlora prep applicators x 2, allowed to dry. Pure and clean wound cleanser spray x 3 to wound bed per Dr Donata Clay. Wound packed with 2 saline moistened 4 x 4s, covered with several dry 4 x 4s. Slight redness, no foul odor, or tenderness noted. Moderate of serous thick green drainage on packing. Drive line anchor intact. Continue twice daily sterile wet to dry dressing changes per bedside RN or VAD coordinator. Next dressing change due 07/17/22 afternoon.       Labs:  LDH trend: 249>244>208>22>229>199>210>189>163>160>147>157>147>145>169>149>122>143>152>119>124>126>135>123>127>131  INR trend: 1.3 >1.3>1.2>1.1>1.1>1.1>1.1>1.2>1.3>1.5>1.6>1.7>1.7>1.5>1.7>1.8>1.9>1.4>1.2>1.2>1.1>1.4>1.4>1.5  Anticoagulation Plan: -INR Goal: 2.0 - 2.5 -ASA Dose: none  ICD: N/A  Drips:  Heparin - 500 units/hr  Infection:  06/07/22>> repeat drive line culture>> abundant MRSA; final  Blood Products: - 06/21/22>> 2 PRBC   Plan/Recommendations:  Page VAD coordinators for equipment or drive line issues BID sterile wet to dry drive line site dressing changes per VAD coordinator or bedside RN  Alyce Pagan RN VAD Coordinator  Office: [phone]  24/7 Pager: [phone]

## 2022-07-18 ENCOUNTER — Other Ambulatory Visit (HOSPITAL_COMMUNITY): Payer: Self-pay

## 2022-07-18 LAB — BASIC METABOLIC PANEL
Anion gap: 6 (ref 5–15)
BUN: 27 mg/dL — ABNORMAL HIGH (ref 8–23)
CO2: 24 mmol/L (ref 22–32)
Calcium: 8.9 mg/dL (ref 8.9–10.3)
Chloride: 107 mmol/L (ref 98–111)
Creatinine, Ser: 1.32 mg/dL — ABNORMAL HIGH (ref 0.61–1.24)
GFR, Estimated: >60 mL/min (ref >60)
Glucose, Bld: 125 mg/dL — ABNORMAL HIGH (ref 70–99)
Potassium: 4 mmol/L (ref 3.5–5.1)
Sodium: 137 mmol/L (ref 135–145)

## 2022-07-18 LAB — CBC
HCT: 27.9 % — ABNORMAL LOW (ref 39.0–52.0)
Hemoglobin: 8.9 g/dL — ABNORMAL LOW (ref 13.0–17.0)
MCH: 28.7 pg (ref 26.0–34.0)
MCHC: 31.9 g/dL (ref 30.0–36.0)
MCV: 90 fL (ref 80.0–100.0)
Platelets: 198 10*3/uL (ref 150–400)
RBC: 3.1 MIL/uL — ABNORMAL LOW (ref 4.22–5.81)
RDW: 18.8 % — ABNORMAL HIGH (ref 11.5–15.5)
WBC: 7.8 10*3/uL (ref 4.0–10.5)
nRBC: 0 % (ref 0.0–0.2)

## 2022-07-18 LAB — PROTIME-INR
INR: 1.7 — ABNORMAL HIGH (ref 0.8–1.2)
Prothrombin Time: 19.4 seconds — ABNORMAL HIGH (ref 11.4–15.2)

## 2022-07-18 LAB — HEPARIN LEVEL (UNFRACTIONATED): Heparin Unfractionated: <0.1 IU/mL — ABNORMAL LOW (ref 0.30–0.70)

## 2022-07-18 LAB — LACTATE DEHYDROGENASE: LDH: 139 U/L (ref 98–192)

## 2022-07-18 LAB — GLUCOSE, CAPILLARY
Glucose-Capillary: 120 mg/dL — ABNORMAL HIGH (ref 70–99)
Glucose-Capillary: 159 mg/dL — ABNORMAL HIGH (ref 70–99)

## 2022-07-18 MED ORDER — WARFARIN SODIUM 5 MG PO TABS
10.0000 mg | ORAL_TABLET | Freq: Once | ORAL | Status: AC
Start: 2022-07-18 — End: 2022-07-18
  Administered 2022-07-18: 10 mg via ORAL
  Filled 2022-07-18: qty 2

## 2022-07-18 MED ORDER — DOXYCYCLINE HYCLATE 100 MG PO TABS
100.0000 mg | ORAL_TABLET | Freq: Two times a day (BID) | ORAL | 11 refills | Status: DC
  Filled 2022-07-18: qty 30, 15d supply, fill #0

## 2022-07-18 NOTE — Progress Notes (Signed)
LVAD Coordinator Rounding Note:  Admitted 06/12/22 to Dr. Alford Highland service due to drive line debridement scheduled with Dr Donata Clay 06/14/22.   S/p driveline site debridement and wound vac on 7/7.  7/10 Had Ramp ECHO. Speed optimized. 7/13 OR to [facility] dressing change.  Back on Heparin drip.  7/20 OR for debridement and VAC dressing change 7/31 OR for debridement and VAC change  Pt sitting up in recliner this morning.  Denies complaints. He says he will be discharged home this afternoon. Home dressing supplies packed and available in room for him to take home.   Plan for BID sterile wet to dry drive line site dressing changes while hospitalized.   Pt has adequate dressing kits, saline, and 4 x 4s at home from previous admission. Provided with extra tape and cotton tip applicators today for home use.    Pt and Tracey aware of follow up appts in VAD clinic:   Dressing change with Dr Zenaida Niece Trigt: 07/22/22 @ 0830  Hospital d/c f/u and dressing change with Dr Zenaida Niece Trigt: 07/29/22 @ 0830  Vital signs: Temp:  97.6 HR: 87 Doppler Pressure: 100 Automatic BP: 122/75 (91) O2 Sat: 100% on RA Wt: 171.7>174.6>178.6>182.9>181......198.5>194>195.9>194.8>197.6>196 lbs   LVAD interrogation reveals:  Speed: 5600 Flow:  3.9 Power: 4.0 w PI: 4.8 Alarms: on batteries Events: on batteries Hct: 30  Fixed speed:  5600 Low speed limit:  5300  Drive Line:  Dressing changed this morning per VAD coordinaton with BS nurse observing. BS RN to change dressing this afternoon.   Existing VAD dressing removed and site care performed using sterile technique.  Drive line exit site cleaned with Chlora prep applicators x 2, allowed to dry. Pure and clean wound cleanser spray x 3 to wound bed per Dr Donata Clay. Wound packed with 2 saline moistened 4 x 4s, covered with several dry 4 x 4s. Slight redness, no foul odor, or tenderness noted. Moderate of serous thick yellow/green drainage on packing. Drive line anchor x 2  intact. Continue twice daily sterile wet to dry dressing changes per bedside RN or VAD coordinator. Next dressing change due 07/18/22 afternoon.   Labs:  LDH trend: 249>244>208>22>229>199>210......124>126>135>123>127>131>139  INR trend: 1.3 >1.3>1.2>1.1>1.1>1.1>1.1>1.2>1.3>1.5.Marland Kitchen..1.1>1.4>1.4>1.5>1.7  Anticoagulation Plan: -INR Goal: 2.0 - 2.5 -ASA Dose: none  ICD: N/A  Drips:  Heparin - 500 units/hr  Infection:  06/07/22>> repeat drive line culture>> abundant MRSA; final  Blood Products: - 06/21/22>> 2 PRBC   Plan/Recommendations:  Page VAD coordinators for equipment or drive line issues BID sterile wet to dry drive line site dressing changes per VAD coordinator or bedside RN  Hessie Diener RN VAD Coordinator  Office: [phone]  24/7 Pager: [phone]

## 2022-07-18 NOTE — Progress Notes (Addendum)
Patient ID: Kevin Underwood, male   DOB: 08/29/1961, 61 y.o.   MRN: [id number]   Advanced Heart Failure VAD Team Note  PCP-Cardiologist: Nona Dell, MD   Subjective:    S/p driveline site debridement and wound vac on 7/7.  7/10 Had Ramp ECHO. Speed optimized. 7/13 OR to VAC change.  Back on Heparin drip.  7/20 OR for debridement and VAC change 7/31 OR for debriedment and VAC change  On heparin. INR 1.7. IV abx course will be completed today.   Walked multiple times yesterday,. Denies SOB. No pain.   LVAD INTERROGATION:  HeartMate III LVAD:   Flow 4.2 liters/min, speed 5600, power 4 PI 3.2 . VAD interrogated personally. Parameters stable.  Objective:    Vital Signs:   Temp:  [97.6 F (36.4 C)-98.2 F (36.8 C)] 98.2 F (36.8 C) (08/09 2318) Pulse Rate:  [73-92] 73 (08/09 2318) Resp:  [16-21] 19 (08/09 2318) BP: (101-112)/(66-88) 112/88 (08/09 2318) SpO2:  [94 %-96 %] 96 % (08/09 2318) Weight:  [89 kg-90.4 kg] 89 kg (08/10 0546) Last BM Date : 07/17/22 Mean arterial Pressure  80s  Intake/Output:   Intake/Output Summary (Last 24 hours) at 07/18/2022 0706 Last data filed at 07/18/2022 0604 Gross per 24 hour  Intake 357.77 ml  Output 0 ml  Net 357.77 ml     Physical Exam  General:  well appearing. Looks stated age. No respiratory difficulty HEENT: normal Neck: supple. JVD ~7 cm. Carotids 2+ bilat; no bruits. No lymphadenopathy or thyromegaly appreciated. Cor:  Mechanical heart sounds with LVAD hum present.  Lungs: clear Abdomen: soft, nontender, nondistended. No hepatosplenomegaly. No bruits or masses. Good bowel sounds. LVAD site: covered in gauze and medipore tape.  Extremities: no cyanosis, clubbing, rash, edema. RUE PICC Neuro: alert & oriented x 3, cranial nerves grossly intact. moves all 4 extremities w/o difficulty. Affect pleasant.   Telemetry   SR 80s (Personally reviewed)    Labs   Basic Metabolic Panel: Recent Labs  Lab 07/14/22 0052  07/15/22 0054 07/15/22 0842 07/16/22 0320 07/17/22 0455 07/18/22 0530  NA 137 138  --  136 139 137  K 4.0 3.9  --  3.7 3.9 4.0  CL 107 108  --  105 106 107  CO2 23 22  --  25 23 24   GLUCOSE 132* 181*  --  161* 137* 125*  BUN 26* 29*  --  27* 32* 27*  CREATININE 1.16 1.37*  --  1.20 1.31* 1.32*  CALCIUM 8.8* 8.7*  --  8.7* 8.8* 8.9  MG  --   --  1.8  --  1.8  --     Liver Function Tests: No results for input(s): "AST", "ALT", "ALKPHOS", "BILITOT", "PROT", "ALBUMIN" in the last 168 hours.  No results for input(s): "LIPASE", "AMYLASE" in the last 168 hours. No results for input(s): "AMMONIA" in the last 168 hours.  CBC: Recent Labs  Lab 07/13/22 0335 07/15/22 0054 07/16/22 0320 07/17/22 0455 07/18/22 0530  WBC 8.3 6.1 7.2 7.3 7.8  HGB 9.1* 8.3* 8.7* 8.8* 8.9*  HCT 28.3* 25.5* 26.1* 27.6* 27.9*  MCV 88.7 88.2 87.6 89.9 90.0  PLT 185 177 185 199 198    INR: Recent Labs  Lab 07/14/22 0052 07/15/22 0054 07/16/22 0320 07/17/22 0455 07/18/22 0530  INR 1.4* 1.4* 1.4* 1.5* 1.7*    Other results:   Imaging   No results found.   Medications:     Scheduled Medications:  allopurinol  100 mg Oral Daily  Chlorhexidine Gluconate Cloth  6 each Topical Daily   docusate sodium  200 mg Oral BID   [START ON 07/19/2022] doxycycline  100 mg Oral Q12H   gabapentin  600 mg Oral BID   guaiFENesin  600 mg Oral BID   hydrALAZINE  25 mg Oral TID   insulin aspart  0-15 Units Subcutaneous TID WC   insulin aspart  0-5 Units Subcutaneous QHS   insulin aspart  3 Units Subcutaneous TID WC   insulin detemir  15 Units Subcutaneous QHS   loratadine  10 mg Oral Daily   methimazole  20 mg Oral BID   metoprolol succinate  25 mg Oral Daily   pantoprazole  40 mg Oral Daily   polyethylene glycol  17 g Oral Daily   rosuvastatin  20 mg Oral Daily   sertraline  50 mg Oral Daily   simethicone  80 mg Oral QID   tamsulosin  0.4 mg Oral Daily   traZODone  150 mg Oral QHS   Warfarin -  Pharmacist Dosing Inpatient   Does not apply q1600    Infusions:  heparin 500 Units/hr (07/18/22 0616)   vancomycin Stopped (07/17/22 2323)    PRN Medications: acetaminophen, albuterol, lactose free nutrition, morphine injection, ondansetron (ZOFRAN) IV, mouth rinse, [DISCONTINUED] oxyCODONE **OR** oxyCODONE, sorbitol, traMADol   Patient Profile   61 y.o. male with history of HM III LVAD, chronic systolic CHF/ICM, COPD, PAF. 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   Readmitted for driveline infection and surgical debridement/possible wound vac placement.  Assessment/Plan:    1. Driveline infection: Recent driveline infection [id number]. S/p driveline site debridement 6/8. Surgical wound cultures grew MRSA, repeat debridement 6/23. Driveline debridement with wound vac placement on 7/7. 7/13, 7/20 VAC dressing change. Went back to OR again on 7/31 for debridement and VAC change - Transitioned from daptomycin to vancomycin due to elevated CK.  Vancomycin planned x 6 wks, end date 07/18/22 (today) then will switch doxycycline 100 mg twice a day for chronic suppression.  - PICC to be removed in anticipation of discharge later today - INR 1.7 today. Had trouble increasing INR d/t intake of boost, asked to decrease amount of times drank throughout the day. Heparin restarted post-op.  - Stop heparin gtt - Plan to d/c home later today after last dose of Vanc.: INR >1.7 and abx course completed.  -Keep sterile wet-dry dressings with changed BID, once a day after discharge. Wife educated on dressing changes per VAD coordinator. 2.  Chronic systolic CHF: Echo [id number] with EF 20-25%, mildly decreased RV function. LHC/RHC in 12/21 with patent grafts, low output. Suspect mixed ischemic/nonischemic cardiomyopathy (prior heavy ETOH and drugs as well as CAD).  No ETOH, drugs, smoking since CABG in 12/20. Admitted with cardiogenic shock in 12/21, had placement of Impella 5.5  initially, now s/p Heartmate 3 LVAD on 11/17/20.  Ramp echo 2/23 with speed decreased to 5500 rpm. He would eventually like heart transplant. NYHA class II. Have been allowing MAP to run higher due to recent AKI thought to be due to lowering BP too much in setting of right renal artery stenosis. Had Ramp ECHO with speed optimized.  - Volume status stable. Maps ok.   - INR 1.7. At goal. - Continue Toprol XL 25 mg daily.  - Continue hydralazine 25 mg tid.  - Renal function stable.  3. AKI: Creatinine up to 4.6 last admission, initially thought this was ATN from low BP but  he was never markedly hypotensive and LFTs without any evidence of shock liver. ? Vanc toxicity (troughs were ok). Taken off Entresto and hydralazine. Volume repleted with IVF. In reviewing prior CTA abdomen/pelvis, he has high-grade stenosis of proximal right renal artery. D/w Renal and feel like he may need higher perfusion pressures.  Creatinine stable 1.3.  MAPs in the 90s.  - Would consider reassessment of renal artery stenosis and possible treatment once creatinine has improved to baseline.  4.  CAD: S/p CABG 12/20.  LHC pre-VAD with patent grafts, no target for intervention. - Continue statin.  5.  Atrial fibrillation: Paroxysmal.  S/p Maze and LA appendage clip with CABG in 12/20. Maintaining SR.  - Continue Toprol XL 25 mg daily.  - Off amiodarone with hyperthyroidism. 6.  Type 2 diabetes: Takes glipizide at home.  - SSI  - Continue Levemir 15 units qHS - Diabetes coordinator following 7. 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  8. 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  He missed his endocrinology appt. Had been rescheduled for July 27th, will need to reschedule again. - He is now off amiodarone.   - Continue methimazole 20 mg bid, TSH was mildly elevated.   9.  Gout: On allopurinol 10. Anemia (chronic blood loss): Got 1u RBCs on 7/12. Hgb 8.9 today  11. NSVT: 4 beats  8/7.  Watch K/Mag. 2 grams mag yesterday.   I reviewed the LVAD parameters from today, and compared the results to the patient's prior recorded data.  No programming changes were made.  The LVAD is functioning within specified parameters.  The patient performs LVAD self-test daily.  LVAD interrogation was negative for any significant power changes, alarms or PI events/speed drops.  LVAD equipment check completed and is in good working order.  Back-up equipment present.   LVAD education done on emergency procedures and precautions and reviewed exit site care.  Length of Stay: [address] AGACNP-BC  07/18/2022  Patient seen with NP, agree with the above note.   INR 1.7 today.  He will get his last vancomycin dose today.   General: Well appearing this am. NAD.  HEENT: Normal. Neck: Supple, JVP 7-8 cm. Carotids OK.  Cardiac:  Mechanical heart sounds with LVAD hum present.  Lungs:  CTAB, normal effort.  Abdomen:  NT, ND, no HSM. No bruits or masses. +BS  LVAD exit site: Well-healed and incorporated. Dressing dry and intact. No erythema or drainage. Stabilization device present and accurately applied. Driveline dressing changed daily per sterile technique. Extremities:  Warm and dry. No cyanosis, clubbing, rash, or edema.  Neuro:  Alert & oriented x 3. Cranial nerves grossly intact. Moves all 4 extremities w/o difficulty. Affect pleasant    Last vancomycin today, then transition to doxycycline 100 mg bid for long-term.    INR 1.7 today, ok for discharge.   He will go home today with close followup in CHF clinic.   Marca Ancona 07/18/2022 10:16 AM

## 2022-07-18 NOTE — Progress Notes (Signed)
ANTICOAGULATION CONSULT NOTE  Pharmacy Consult for warfarin + heparin Indication:  LVAD  No Known Allergies  Patient Measurements: Height: 5\' 10"  (177.8 cm) Weight: 89 kg (196 lb 3.4 oz) IBW/kg (Calculated) : 73 Heparin Dosing Weight: 80kg  Vital Signs: Temp: 97.6 F (36.4 C) (08/10 1055) Temp Source: Oral (08/10 1055) BP: 134/91 (08/10 1057) Pulse Rate: 95 (08/10 1055)  Labs: Recent Labs    07/16/22 0320 07/17/22 0455 07/18/22 0530  HGB 8.7* 8.8* 8.9*  HCT 26.1* 27.6* 27.9*  PLT 185 199 198  LABPROT 16.6* 17.9* 19.4*  INR 1.4* 1.5* 1.7*  HEPARINUNFRC <0.10* <0.10* <0.10*  CREATININE 1.20 1.31* 1.32*     Estimated Creatinine Clearance: 66 mL/min (A) (by C-G formula based on SCr of 1.32 mg/dL (H)).   Medical History: Past Medical History:  Diagnosis Date   "    Arthritis    CAD (coronary artery disease)    a. s/p CABG in 11/2019 with LIMA-LAD, SVG-OM1, SVG-PDA and SVG-D1   CHF (congestive heart failure) (HCC)    a. EF < 20% by echo in 11/2019   Essential hypertension    PAF (paroxysmal atrial fibrillation) (HCC)    Type 2 diabetes mellitus (HCC)     vancomycin Stopped (07/17/22 2323)     Assessment: 61 yoM with HM3 LVAD admitted with ongoing LVAD driveline infection. Pt with planned debridements in OR. Pt is on warfarin PTA.  Pt s/p multiple debridements for wound infection, last 7/31. Per Dr. 8/31 no additional debridements planned.  INR today remains just slightly subtherapeutic at 1.7, CBC and LDH stable. Heparin level <0.1 as expected.   INR slow to rise despite doses of Coumadin higher than home doses.  Pt getting Boost nutritional supplements TID which may be decreasing effect of Coumadin due to vit K content (although 3 shakes a day = 0.9 mg vitamin K) > stopped 8/9.  *Home warfarin dose 6mg  MWF, 4mg  AODs  Goal of Therapy:  INR goal 2-2.5 Heparin level < 0.3 units/ml Monitor platelets by anticoagulation protocol: Yes   Plan:  Stop IV  heparin Give larger warfarin dose of  10 mg x1 again today  Daily INR, heparin level, CBC, LDH  Thank you for allowing pharmacy to participate in this patient's care.  10/9, , BCCP Clinical Pharmacist  07/18/2022 1:55 PM   [facility] pharmacy phone numbers are listed on [email or web address]

## 2022-07-22 ENCOUNTER — Ambulatory Visit (HOSPITAL_COMMUNITY)
Admission: RE | Admit: 2022-07-22 | Discharge: 2022-07-22 | Disposition: A | Discharge status: Home / Self Care | Payer: 59 | Source: Ambulatory Visit | Attending: Internal Medicine | Admitting: Internal Medicine

## 2022-07-22 ENCOUNTER — Encounter (HOSPITAL_COMMUNITY): Payer: 59

## 2022-07-22 DIAGNOSIS — Z95811 Presence of heart assist device: Secondary | ICD-10-CM

## 2022-07-22 DIAGNOSIS — Z4801 Encounter for change or removal of surgical wound dressing: Secondary | ICD-10-CM | POA: Diagnosis present

## 2022-07-22 NOTE — Progress Notes (Addendum)
Pt presents to clinic today with his wife Linus Orn for wound check with Dr Prescott Gum.   Reports he has been feeling great since going home. Linus Orn states daily dressing changes have been going well. Reports continued serous drainage with thick green discharge (from [employer] product per Dr Prescott Gum.) Taking Doxycycline 100 mg BID. Reports mild GI upset with antibiotic. Encouraged to take with food. He verbalized understanding.   ID hospital d/c follow up appt scheduled 07/24/22. Pt and wife both verbalized understanding.   Dressing change completed today by Dr Prescott Gum. Dr Prescott Gum would like to use Supra SDRM matrix with next dressing change. Will reach out to product rep for prior authorization information.   Drive Line:  Dressing last changed yesterday per [employer]. Existing VAD dressing removed and site care performed using sterile technique. Wound bed gently debrided with dry 4 x 4 to remove yellow exudate. Pure and clean wound cleanser spray x 3 to wound bed per Dr Prescott Gum. Wound packed with 2 cleanser moistened 4 x 4s, covered with several dry 4 x 4s. Slight redness, no foul odor, or tenderness noted. Moderate of serous thick yellow/green drainage on packing. Drive line anchor x 2 intact. Continue daily sterile wet to dry dressing changes.      Plan:  Continue daily wet to dry dressings Return to VAD clinic in 1 week for follow up appt with Dr Prescott Gum and Dr Aundra Dubin.  Emerson Monte RN [place] Coordinator  Office: [phone]  24/7 Pager: [phone]

## 2022-07-22 NOTE — Addendum Note (Signed)
Encounter addended by: Bernita Raisin, RN on: 07/22/2022 12:13 PM  Actions taken: Clinical Note Signed

## 2022-07-23 ENCOUNTER — Encounter (HOSPITAL_COMMUNITY): Payer: 59

## 2022-07-23 ENCOUNTER — Ambulatory Visit (HOSPITAL_COMMUNITY): Payer: Self-pay | Admitting: Pharmacist

## 2022-07-23 LAB — POCT INR: INR: 1.4 — AB (ref 2.0–3.0)

## 2022-07-24 ENCOUNTER — Encounter: Payer: Self-pay | Admitting: Internal Medicine

## 2022-07-24 ENCOUNTER — Other Ambulatory Visit: Payer: Self-pay

## 2022-07-24 ENCOUNTER — Other Ambulatory Visit (HOSPITAL_COMMUNITY): Payer: Self-pay

## 2022-07-24 ENCOUNTER — Ambulatory Visit (INDEPENDENT_AMBULATORY_CARE_PROVIDER_SITE_OTHER): Payer: 59 | Admitting: Internal Medicine

## 2022-07-24 DIAGNOSIS — A4902 Methicillin resistant Staphylococcus aureus infection, unspecified site: Secondary | ICD-10-CM | POA: Diagnosis not present

## 2022-07-24 MED ORDER — DOXYCYCLINE HYCLATE 100 MG PO TABS: 100.0000 mg | ORAL_TABLET | Freq: Two times a day (BID) | ORAL | 11 refills | Status: DC

## 2022-07-24 NOTE — Assessment & Plan Note (Addendum)
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  At this time is stable with continued wound care and will remain on suppressive doxycycline lifelong. He will return to see me in 6 months, sooner if there are issues with the antibiotics or if hospitalized.   I have personally spent 30 minutes involved in face-to-face and non-face-to-face activities for this patient on the day of the visit. Professional time spent includes the following activities: Preparing to see the patient (review of tests), Obtaining and/or reviewing separately obtained history (admission/discharge record), Performing a medically appropriate examination and/or evaluation , Ordering medications/tests/procedures, referring and communicating with other health care professionals, Documenting clinical information in the EMR, Independently interpreting results (not separately reported), Communicating results to the patient/family/caregiver, Counseling and educating the patient/family/caregiver and Care coordination (not separately reported).

## 2022-07-24 NOTE — Progress Notes (Signed)
   Subjective:    Patient ID: Kevin Underwood, male    DOB: 1961/09/30, 61 y.o.   MRN: [id number]  HPI Kevin Underwood is here for a follow up of a driveline infection associated with his LVAD.  He has a history of admission for LVAD driveline infection with MRSA in June and underwent debridement at that time.  He received 4 weeks of appropriate IV vancomycin, though switched to daptomycin due to some nausea.  On 6/23 he had persistent drainage and returned to the OR and debrided multiple times on 7/7, 7/13, 7/20 and 7/31.  He continued on vancomycin through 8/10  and discharged that day.  He received a dose of dalvance as well that day and now here for follow up.  No new issues reported, no fever or chills.  He restarted the doxycycline after leaving the hospital.  No issues with tolerating doxycycline. Drainage noted and his wife reports continued decrease in drainage.  Saw Kevin Underwood for dressing change on 8/14 and plan to continue with wet to dry dressing changes.      Review of Systems  Constitutional:  Negative for fatigue.  Gastrointestinal:  Negative for diarrhea.  Skin:  Negative for rash.       Objective:   Physical Exam Eyes:     General: No scleral icterus. Pulmonary:     Effort: Pulmonary effort is normal.  Neurological:     Mental Status: He is alert.   SH: no current tobacco        Assessment & Plan:

## 2022-07-25 ENCOUNTER — Other Ambulatory Visit (HOSPITAL_COMMUNITY): Payer: Self-pay

## 2022-07-25 DIAGNOSIS — Z95811 Presence of heart assist device: Secondary | ICD-10-CM

## 2022-07-25 DIAGNOSIS — Z7901 Long term (current) use of anticoagulants: Secondary | ICD-10-CM

## 2022-07-29 ENCOUNTER — Encounter (HOSPITAL_COMMUNITY): Payer: 59

## 2022-07-29 ENCOUNTER — Telehealth (HOSPITAL_COMMUNITY): Payer: Self-pay | Admitting: *Deleted

## 2022-07-29 ENCOUNTER — Ambulatory Visit (HOSPITAL_COMMUNITY): Payer: Self-pay | Admitting: Pharmacist

## 2022-07-29 LAB — POCT INR: INR: 1.8 — AB (ref 2.0–3.0)

## 2022-07-29 NOTE — Telephone Encounter (Signed)
Pt's wife called and reports pt refusing to come to clinic this morning. States he is frustrated that he can not shower, and that his wound limits him being able to do what he wants. Reports wound drainage is the same, but notes increased redness around wound. She feels that this is because he is spending a lot of time working outside, and the dressing is irritating his skin.   Rescheduled to Wednesday 07/31/22 at 10:00. Tracey verbalized understanding.   Alyce Pagan RN VAD Coordinator  Office: [phone]  24/7 Pager: [phone]

## 2022-07-31 ENCOUNTER — Other Ambulatory Visit (HOSPITAL_COMMUNITY): Payer: Self-pay

## 2022-07-31 ENCOUNTER — Encounter (HOSPITAL_COMMUNITY): Payer: 59

## 2022-07-31 ENCOUNTER — Telehealth (HOSPITAL_COMMUNITY): Payer: Self-pay

## 2022-07-31 DIAGNOSIS — T827XXA Infection and inflammatory reaction due to other cardiac and vascular devices, implants and grafts, initial encounter: Secondary | ICD-10-CM

## 2022-07-31 DIAGNOSIS — R52 Pain, unspecified: Secondary | ICD-10-CM

## 2022-07-31 MED ORDER — TRAMADOL HCL 50 MG PO TABS: 50.0000 mg | ORAL_TABLET | Freq: Two times a day (BID) | ORAL | 1 refills | Status: DC | PRN

## 2022-07-31 NOTE — Telephone Encounter (Addendum)
Patient's wife [name] called VAD Clinic this AM to cancel appointment stating patient fell this past Sunday. Patient is having pain in back and side that is preventing him to be able to make his appointment today. Patient reports pain is getting better and he is able to move around the house some. Patient declines need for further assessment today.[name] stated that there was some pink tined drainage that was shadowing through dressing. Patient reports no fever or chills. Valinda Hoar to call VAD Coordinator is pink tinge drainage gets worse but way most likely from trauma of the fall. Dr Shirlee Latch made aware and prescription refilled for 50mg  of Tramadol BID. Appointment rescheduled for 08/05/22 at 0830 and Dr 08/07/22 made aware.

## 2022-08-05 ENCOUNTER — Ambulatory Visit (HOSPITAL_COMMUNITY)
Admission: RE | Admit: 2022-08-05 | Discharge: 2022-08-05 | Disposition: A | Discharge status: Home / Self Care | Payer: 59 | Source: Ambulatory Visit | Attending: Cardiology | Admitting: Cardiology

## 2022-08-05 ENCOUNTER — Encounter (HOSPITAL_COMMUNITY): Payer: Self-pay

## 2022-08-05 ENCOUNTER — Other Ambulatory Visit (HOSPITAL_COMMUNITY): Payer: Self-pay | Admitting: *Deleted

## 2022-08-05 ENCOUNTER — Ambulatory Visit (HOSPITAL_COMMUNITY): Payer: Self-pay | Admitting: Pharmacist

## 2022-08-05 VITALS — BP 135/82 | HR 90 | Wt 195.8 lb

## 2022-08-05 DIAGNOSIS — T827XXA Infection and inflammatory reaction due to other cardiac and vascular devices, implants and grafts, initial encounter: Secondary | ICD-10-CM | POA: Diagnosis not present

## 2022-08-05 DIAGNOSIS — Z95811 Presence of heart assist device: Secondary | ICD-10-CM | POA: Diagnosis not present

## 2022-08-05 DIAGNOSIS — E0590 Thyrotoxicosis, unspecified without thyrotoxic crisis or storm: Secondary | ICD-10-CM | POA: Insufficient documentation

## 2022-08-05 DIAGNOSIS — Z79899 Other long term (current) drug therapy: Secondary | ICD-10-CM | POA: Diagnosis not present

## 2022-08-05 DIAGNOSIS — Z7901 Long term (current) use of anticoagulants: Secondary | ICD-10-CM | POA: Insufficient documentation

## 2022-08-05 DIAGNOSIS — D500 Iron deficiency anemia secondary to blood loss (chronic): Secondary | ICD-10-CM | POA: Insufficient documentation

## 2022-08-05 DIAGNOSIS — M109 Gout, unspecified: Secondary | ICD-10-CM | POA: Diagnosis not present

## 2022-08-05 DIAGNOSIS — N183 Chronic kidney disease, stage 3 unspecified: Secondary | ICD-10-CM | POA: Insufficient documentation

## 2022-08-05 DIAGNOSIS — A4902 Methicillin resistant Staphylococcus aureus infection, unspecified site: Secondary | ICD-10-CM | POA: Diagnosis not present

## 2022-08-05 DIAGNOSIS — D509 Iron deficiency anemia, unspecified: Secondary | ICD-10-CM

## 2022-08-05 DIAGNOSIS — I130 Hypertensive heart and chronic kidney disease with heart failure and stage 1 through stage 4 chronic kidney disease, or unspecified chronic kidney disease: Secondary | ICD-10-CM | POA: Insufficient documentation

## 2022-08-05 DIAGNOSIS — I480 Paroxysmal atrial fibrillation: Secondary | ICD-10-CM | POA: Diagnosis not present

## 2022-08-05 DIAGNOSIS — R0781 Pleurodynia: Secondary | ICD-10-CM

## 2022-08-05 DIAGNOSIS — I2510 Atherosclerotic heart disease of native coronary artery without angina pectoris: Secondary | ICD-10-CM | POA: Diagnosis not present

## 2022-08-05 DIAGNOSIS — Z951 Presence of aortocoronary bypass graft: Secondary | ICD-10-CM | POA: Insufficient documentation

## 2022-08-05 DIAGNOSIS — E1122 Type 2 diabetes mellitus with diabetic chronic kidney disease: Secondary | ICD-10-CM | POA: Insufficient documentation

## 2022-08-05 DIAGNOSIS — I5022 Chronic systolic (congestive) heart failure: Secondary | ICD-10-CM | POA: Diagnosis present

## 2022-08-05 LAB — BASIC METABOLIC PANEL
Anion gap: 9 (ref 5–15)
BUN: 16 mg/dL (ref 8–23)
CO2: 26 mmol/L (ref 22–32)
Calcium: 8.9 mg/dL (ref 8.9–10.3)
Chloride: 104 mmol/L (ref 98–111)
Creatinine, Ser: 1.27 mg/dL — ABNORMAL HIGH (ref 0.61–1.24)
GFR, Estimated: >60 mL/min (ref >60)
Glucose, Bld: 155 mg/dL — ABNORMAL HIGH (ref 70–99)
Potassium: 3.7 mmol/L (ref 3.5–5.1)
Sodium: 139 mmol/L (ref 135–145)

## 2022-08-05 LAB — CBC
HCT: 29.9 % — ABNORMAL LOW (ref 39.0–52.0)
Hemoglobin: 10.2 g/dL — ABNORMAL LOW (ref 13.0–17.0)
MCH: 29.8 pg (ref 26.0–34.0)
MCHC: 34.1 g/dL (ref 30.0–36.0)
MCV: 87.4 fL (ref 80.0–100.0)
Platelets: 257 10*3/uL (ref 150–400)
RBC: 3.42 MIL/uL — ABNORMAL LOW (ref 4.22–5.81)
RDW: 16.8 % — ABNORMAL HIGH (ref 11.5–15.5)
WBC: 7.5 10*3/uL (ref 4.0–10.5)
nRBC: 0 % (ref 0.0–0.2)

## 2022-08-05 LAB — VITAMIN B12: Vitamin B-12: 492 pg/mL (ref 180–914)

## 2022-08-05 LAB — IRON AND TIBC
Iron: 45 ug/dL (ref 45–182)
Saturation Ratios: 19 % (ref 17.9–39.5)
TIBC: 241 ug/dL — ABNORMAL LOW (ref 250–450)
UIBC: 196 ug/dL

## 2022-08-05 LAB — PROTIME-INR
INR: 1.9 — ABNORMAL HIGH (ref 0.8–1.2)
Prothrombin Time: 21.4 seconds — ABNORMAL HIGH (ref 11.4–15.2)

## 2022-08-05 LAB — FERRITIN: Ferritin: 204 ng/mL (ref 24–336)

## 2022-08-05 LAB — FOLATE: Folate: 20.4 ng/mL (ref >5.9)

## 2022-08-05 LAB — LACTATE DEHYDROGENASE: LDH: 115 U/L (ref 98–192)

## 2022-08-05 NOTE — Progress Notes (Addendum)
Patient presents for hospital d/c f/u with his wife [name]. Reports no problems with VAD equipment or concerns with drive line.  Reports he has been intermittently lightheaded and dizzy since last week. States he "blacked out" last Sunday, and fell on floor, injuring right side/ribs. Reports continued pain on right side. Taking PRN Tramadol with some relief. Chest xray completed today- no active disease or fracture noted.  Denies heart failure symptoms, shortness of breath, and signs of bleeding. States he is tired all the time. Reports he went to donate blood over the weekend and was told he could not donate because he is anemic. Anemia panel drawn today.   100+ PI events yesterday. Pt states he is trying to drink 2L per day.    Pt has new patient endocrinology appt Wednesday 8/30 at 1pm at [facility] in [place]. Both pt and wife verbalized understanding of appt information, and importance of keeping this appt.   Primary controller backup battery replaced while pt hospitalized. Charged for backup battery today.    Vital Signs:  Doppler Pressure: 110 Automatc BP: 135/82 (100) HR: 90 SPO2: UTO    Weight: 195.8 lb w/ eqt Last weight: 198 lb   Home weights:  lbs  VAD Indication: Destination Therapy. HM III implanted 11/17/20 by Dr Orvan Seen   VAD interrogation: Speed: 5600 Flow: 4.2 Power: 4.2 w    PI: 3.5 Hct: 30  Alarms: none  Events: 100+  Fixed speed: 5500 Low speed limit: 5200  Primary controller: replace back up battery in 17 months Back up controller: replace back up battery in 20 months   I reviewed the LVAD parameters from today and compared the results to the patient's prior recorded data. LVAD interrogation was NEGATIVE for significant power changes, NEGATIVE for clinical alarms and STABLE for PI events/speed drops. No programming changes were made and pump is functioning within specified parameters. Pt is performing daily controller and system monitor self  tests along with completing weekly and monthly maintenance for LVAD equipment.   LVAD equipment check completed and is in good working order. Back-up equipment present. Charged back up battery and performed self-test on equipment.    Annual Equipment Maintenance on UBC/PM was performed on 01/09/2022.   Exit Site Care: Drive line is being maintained weekly by patient's wife [name]. Dressing is clean, dry, and intact. Anchor correctly applied. Provided with 8 weekly kits for home use.   BP & Labs:  Doppler 110 - Doppler is reflecting MAP   Hgb 10.2 - No S/S of bleeding. Specifically denies melena/BRBPR or nosebleeds. Occasional blood when wiping.    LDH 115 -  with established baseline of 190 - 360. Denies tea-colored urine. No power elevations noted on interrogation.     Plan: No medication changes Coumadin dosing per Lauren PharmD Return to [place] [facility] in 1 week for wound check with Dr Prescott Gum Return to [place] clinic in 2 months for follow up with Dr Dwyane Dee RN [place] Coordinator  Office: [phone]  24/7 Pager: [phone]   Follow up for Heart Failure/LVAD:  61 y.o. 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 type 2 diabetes mellitus, COPD, and hypertension. Now s/p Heartmate 3 LVAD.   Patient had echo in 10/21 with EF 20-25%.  He was admitted in 12/21 with cardiogenic shock.  Cath showed patent grafts and low cardiac  output.  Suspect mixed ischemic/nonischemic cardiomyopathy, prior heavy ETOH though no ETOH or smoking since 12/20 CABG.  He required stabilization with Impella 5.5 and ultimately had placement of Heartmate 3 LVAD.  Paroxysmal atrial fibrillation noted post-op, but generally stable course and discharged home.    Patient was admitted in 10/22 with COPD exacerbation.  [name] was stopped with  frequent PIs.  He was treated with steroids, nebs, antibiotics with improvement.   Ramp echo was done in 2/23.  At 5600 rpm the LV was small and the septum with some left shift, aortic valve opening 3/5 beats.  At 5500 rpm, the LV was larger and septum more midline, aortic valve still opening 3/5 beats.  The RV was moderately enlarged with normal systolic function. I left speed at 5500 rpm.   Patient had a prolonged admission in 6/23 and again in 7/23-8/23 with MRSA driveline infection.  He went to the OR multiple times for debridement and wound vac changes.  He completed vancomycin and is now on doxycycline.   Patient returns for followup of CHF/LVAD. He continues on doxycycline.  Driveline looks ok, Dr Donata Clay also assessed. MAP around 100 today.  He reports that this breathing is stable, gets dyspneic with heavy exertion.  Does fine walking around his house and stores.  He had an episode of lightheadedness last week and fell when getting out of bed, he his is right chest wall and it is still sore.  CXR today was unremarkable.  No melena/BRBPR.  Lately, he has been having frequent PI events but no low flows.   Denies LVAD alarms.  Reports taking Coumadin as prescribed and adherence to anticoagulation based dietary restrictions.  Denies bright red blood per rectum or melena, no dark urine or hematuria.    Labs (12/21): K 4.1, creatinine 0.73, INR 2.4, hgb 12.3, LDH 194, LFTs normal, TSH normal Labs (1/22): hgb 14.4, creatinine 0.91, LDH 197 Labs (5/22): K 3.8, creatinine 1.44, hgb 15, LDH 177 Labs (10/22): K 4, creatinine 1.01 => 0.76, hgb 13.8, LDL 61, LDH 152 Labs (12/22): free T4 elevated with low TSH, creatinine 0.87.  Labs (4/23): Free T4 1.51, TSH 0.135, LDH 178, hgb 12.5, K 3.4, creatinine 1.07 Labs (8/23): creatinine 1.32   Past Medical History:  Diagnosis Date   "    Arthritis    CAD (coronary artery disease)    a. s/p CABG in 11/2019 with LIMA-LAD, SVG-OM1, SVG-PDA and SVG-D1    CHF (congestive heart failure) (HCC)    a. EF < 20% by echo in 11/2019   Essential hypertension    PAF (paroxysmal atrial fibrillation) (HCC)    Type 2 diabetes mellitus (HCC)     Current Outpatient Medications  Medication Sig Dispense Refill   albuterol (PROVENTIL) (2.5 MG/3ML) 0.083% nebulizer solution INHALE 3 ML BY NEBULIZATION EVERY 6 HOURS AS NEEDED FOR WHEEZING OR SHORTNESS OF BREATH 450 mL 1   albuterol (VENTOLIN HFA) 108 (90 Base) MCG/ACT inhaler Inhale 2 puffs into the lungs every 6 (six) hours as needed for wheezing or shortness of breath.     allopurinol (ZYLOPRIM) 100 MG tablet Take 1 tablet (100 mg total) by mouth daily. 90 tablet 3   blood glucose meter kit and supplies KIT Dispense based on patient and insurance preference. Use to check CBG's three times a day. (FOR ICD-9 250.00, 250.01). 1 each 0   colchicine 0.6 MG tablet TAKE 1 TABLET (0.6 MG TOTAL) BY MOUTH AS NEEDED. 180 tablet 1  docusate sodium (COLACE) 100 MG capsule Take 200 mg by mouth 2 (two) times daily.     doxycycline (VIBRA-TABS) 100 MG tablet Take 1 tablet (100 mg total) by mouth every 12 (twelve) hours. 30 tablet 11   fluticasone furoate-vilanterol (BREO ELLIPTA) 100-25 MCG/ACT AEPB Inhale 1 puff into the lungs daily.     gabapentin (NEURONTIN) 600 MG tablet Take 600 mg by mouth 2 (two) times daily.     glipiZIDE (GLUCOTROL) 5 MG tablet Take 1 tablet (5 mg total) by mouth daily. 30 tablet 11   hydrALAZINE (APRESOLINE) 25 MG tablet Take 1 tablet (25 mg total) by mouth 3 (three) times daily. 90 tablet 5   loratadine (CLARITIN) 10 MG tablet Take 1 tablet (10 mg total) by mouth daily. 30 tablet 6   metFORMIN (GLUCOPHAGE) 1000 MG tablet TAKE 1 TABLET (1,000 MG TOTAL) BY MOUTH IN THE MORNING AND AT BEDTIME. 180 tablet 3   methimazole (TAPAZOLE) 10 MG tablet Take 2 tablets (20 mg total) by mouth 2 (two) times daily. 120 tablet 6   metoprolol succinate (TOPROL XL) 25 MG 24 hr tablet Take 1 tablet (25 mg total) by  mouth daily. 30 tablet 6   Multiple Vitamin (MULTIVITAMIN WITH MINERALS) TABS tablet Take 1 tablet by mouth daily.     pantoprazole (PROTONIX) 40 MG tablet TAKE 1 TABLET BY MOUTH EVERY DAY 90 tablet 2   rosuvastatin (CRESTOR) 20 MG tablet Take 20 mg by mouth daily.     sertraline (ZOLOFT) 50 MG tablet Take 1 tablet (50 mg total) by mouth daily. 90 tablet 3   tamsulosin (FLOMAX) 0.4 MG CAPS capsule Take 0.4 mg by mouth daily.     Tetrahydrozoline HCl (REDNESS RELIEVER EYE DROPS OP) Apply 2 drops to eye daily as needed.     traMADol (ULTRAM) 50 MG tablet Take 1 tablet (50 mg total) by mouth 2 (two) times daily as needed. 30 tablet 1   traZODone (DESYREL) 150 MG tablet Take 1 tablet (150 mg total) by mouth at bedtime. 90 tablet 3   warfarin (COUMADIN) 4 MG tablet On 10/5 take 4 mg then resume 6 mg (1.5 tabs) on Mon/Wed/Fri and 4 mg (1 tab) all other days or as directed by [facility] Clinic 60 tablet 11   furosemide (LASIX) 40 MG tablet TAKE 0.5 TABLETS (20 MG TOTAL) BY MOUTH DAILY AS NEEDED. (Patient not taking: Reported on 08/05/2022) 45 tablet 3   guaiFENesin (MUCINEX) 600 MG 12 hr tablet Take 1 tablet (600 mg total) by mouth 2 (two) times daily. (Patient not taking: Reported on 08/05/2022) 60 tablet 6   No current facility-administered medications for this encounter.    Patient has no known allergies.  REVIEW OF SYSTEMS: All systems negative except as listed in HPI, PMH and Problem list.   LVAD INTERROGATION:  See nurse's note above.   I reviewed the LVAD parameters from today, and compared the results to the patient's prior recorded data.  No programming changes were made.  The LVAD is functioning within specified parameters.  The patient performs LVAD self-test daily.  LVAD interrogation was negative for any significant power changes, alarms or PI events/speed drops.  LVAD equipment check completed and is in good working order.  Back-up equipment present.   LVAD education done on emergency procedures  and precautions and reviewed exit site care.    Vitals:   08/05/22 0836 08/05/22 0903  BP: (!) 110/0 135/82  Pulse: 90   Weight: 88.8 kg (195 lb 12.8  oz)   MAP 97  Physical Exam: General: Well appearing this am. NAD.  HEENT: Normal. Neck: Supple, JVP 7-8 cm. Carotids OK.  Cardiac:  Mechanical heart sounds with LVAD hum present.  Lungs:  CTAB, normal effort.  Abdomen:  NT, ND, no HSM. No bruits or masses. +BS  LVAD exit site: Well-healed and incorporated. Dressing dry and intact. No erythema or drainage. Stabilization device present and accurately applied. Driveline dressing changed daily per sterile technique. Extremities:  Warm and dry. No cyanosis, clubbing, rash, or edema.  Neuro:  Alert & oriented x 3. Cranial nerves grossly intact. Moves all 4 extremities w/o difficulty. Affect pleasant    ASSESSMENT AND PLAN: 1. 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  He has completed vancomycin and is on long-term doxycycline.  Driveline site today looks benign.  - Continue doxycycline for chronic suppression.  2.  Chronic systolic CHF: Echo 40/81 with EF 20-25%, mildly decreased RV function. LHC/RHC in 12/21 with patent grafts, low output. Suspect mixed ischemic/nonischemic cardiomyopathy (prior heavy ETOH and drugs as well as CAD).  No ETOH, drugs, smoking since CABG in 12/20. Admitted with cardiogenic shock in 12/21, had placement of Impella 5.5 initially, now s/p Heartmate 3 LVAD on 11/17/20.  Ramp echo 2/23 with speed decreased to 5500 rpm. He would eventually like heart transplant. NYHA class II. Have been allowing MAP to run higher due to recent AKI thought to be due to lowering BP too much in setting of right renal artery stenosis. He is not volume overloaded on exam today.  He has had frequent PI events recently.  MAP mildly elevated.  - Continue current hydralazine and Toprol XL.   - He does not  need a diuretic.   - Continue warfarin, INR goal 2-2.5.  - BMET today.  3. 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 D/w Renal and feel like he may need higher perfusion pressures.  - Would consider reassessment of renal artery stenosis and possible treatment  - BMET today.  4.  CAD: S/p CABG 12/20.  LHC pre-VAD with patent grafts, no target for intervention. - Continue statin.  5.  Atrial fibrillation: Paroxysmal.  S/p Maze and LA appendage clip with CABG in 12/20. Maintaining SR.  - Continue Toprol XL 25 mg daily.  - Off amiodarone with hyperthyroidism. 6.  Type 2 diabetes: Per PCP.  7. 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   8. 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  He is now off amiodarone.   - Continue methimazole 20 mg bid.  - Had endocrinology appt.   9.  Gout: On allopurinol 10. Anemia (chronic blood loss): CBC today.    Loralie Champagne 08/05/2022

## 2022-08-05 NOTE — Patient Instructions (Signed)
No medication changes Coumadin dosing per Lauren PharmD Return to [facility] clinic in 1 week for wound check with Dr Donata Clay Return to [facility] clinic in 2 months for follow up with Dr Shirlee Latch

## 2022-08-07 ENCOUNTER — Encounter: Payer: Self-pay | Admitting: "Endocrinology

## 2022-08-07 ENCOUNTER — Ambulatory Visit (INDEPENDENT_AMBULATORY_CARE_PROVIDER_SITE_OTHER): Payer: 59 | Admitting: "Endocrinology

## 2022-08-07 VITALS — BP 78/50 | HR 68 | Ht 70.0 in | Wt 193.0 lb

## 2022-08-07 DIAGNOSIS — I959 Hypotension, unspecified: Secondary | ICD-10-CM

## 2022-08-07 DIAGNOSIS — E119 Type 2 diabetes mellitus without complications: Secondary | ICD-10-CM | POA: Diagnosis not present

## 2022-08-07 DIAGNOSIS — E0590 Thyrotoxicosis, unspecified without thyrotoxic crisis or storm: Secondary | ICD-10-CM | POA: Diagnosis not present

## 2022-08-07 MED ORDER — METFORMIN HCL 500 MG PO TABS: 500.0000 mg | ORAL_TABLET | Freq: Two times a day (BID) | ORAL | 3 refills | Status: DC

## 2022-08-07 MED ORDER — METHIMAZOLE 5 MG PO TABS: 5.0000 mg | ORAL_TABLET | Freq: Every day | ORAL | 1 refills | Status: DC

## 2022-08-07 NOTE — Patient Instructions (Signed)

## 2022-08-07 NOTE — Progress Notes (Signed)
Endocrinology Consult Note       08/07/2022, 3:01 PM   Subjective:    Patient ID: [name], male    DOB: Feb 03, 1961.  Kevin Underwood is being seen in consultation for management of currently uncontrolled symptomatic diabetes requested by  Ralph Leyden, FNP.   Past Medical History:  Diagnosis Date   "    Arthritis    CAD (coronary artery disease)    a. s/p CABG in 11/2019 with LIMA-LAD, SVG-OM1, SVG-PDA and SVG-D1   CHF (congestive heart failure) (HCC)    a. EF < 20% by echo in 11/2019   Essential hypertension    PAF (paroxysmal atrial fibrillation) (HCC)    Type 2 diabetes mellitus ([place])     Past Surgical History:  Procedure Laterality Date   APPLICATION OF WOUND VAC Left 06/14/2022   Procedure: APPLICATION OF WOUND VAC;  Surgeon: [name], MD;  Location: [facility];  Service: Thoracic;  Laterality: Left;   APPLICATION OF WOUND VAC N/A 06/20/2022   Procedure: VAD POWERCORD TUNNEL WOUND [place];  Surgeon: [name], MD;  Location: [facility];  Service: Thoracic;  Laterality: N/A;   APPLICATION OF WOUND VAC N/A 06/27/2022   Procedure: WOUND VAC CHANGE;  Surgeon: [name], MD;  Location: [facility];  Service: Thoracic;  Laterality: N/A;   APPLICATION OF WOUND VAC N/A 07/08/2022   Procedure: WOUND VAC CHANGE;  Surgeon: [name], MD;  Location: [facility];  Service: Thoracic;  Laterality: N/A;   BACK SURGERY     CLIPPING OF ATRIAL APPENDAGE N/A 11/26/2019   Procedure: Clipping Of Atrial Appendage using AtriCure 40 Clip;  Surgeon: Wonda Olds, MD;  Location: [facility];  Service: Open Heart Surgery;  Laterality: N/A;   CORONARY ARTERY BYPASS GRAFT N/A 11/26/2019   Procedure: CORONARY ARTERY BYPASS GRAFTING (CABG) using endoscopic greater saphenous vein harvest: svc to OM; svc to Diag; svc to PD; and LIMA to LAD.;  Surgeon: Wonda Olds, MD;  Location: [facility];  Service: Open Heart  Surgery;  Laterality: N/A;   FOOT SURGERY     HAND SURGERY     INSERTION OF IMPLANTABLE LEFT VENTRICULAR ASSIST DEVICE N/A 11/17/2020   Procedure: INSERTION OF IMPLANTABLE LEFT VENTRICULAR ASSIST DEVICE - HM3;  Surgeon: Wonda Olds, MD;  Location: [facility];  Service: Open Heart Surgery;  Laterality: N/A;   INTRAOPERATIVE TRANSESOPHAGEAL ECHOCARDIOGRAM  12/04/2019   Procedure: Intraoperative Transesophageal Echocardiogram;  Surgeon: Wonda Olds, MD;  [facility];  Service: Open Heart Surgery;;   MAZE N/A 11/26/2019   Procedure: MAZE using Bilateral Pulmonary Vein isolation.;  Surgeon: Wonda Olds, MD;  Location: [facility];  Service: Open Heart Surgery;  Laterality: N/A;   MULTIPLE EXTRACTIONS WITH ALVEOLOPLASTY N/A 11/16/2020   Procedure: MULTIPLE EXTRACTION WITH ALVEOLOPLASTY;  Surgeon: Charlaine Dalton, DMD;  Location: [facility];  Service: Dentistry;  Laterality: N/A;   PLACEMENT OF IMPELLA LEFT VENTRICULAR ASSIST DEVICE N/A 11/24/2019   Procedure: PLACEMENT OF IMPELLA 5.5 LEFT VENTRICULAR ASSIST DEVICE;  Surgeon: Wonda Olds, MD;  Location: [facility];  Service: Open Heart Surgery;  Laterality: N/A;  PLACEMENT OF IMPELLA LEFT VENTRICULAR ASSIST DEVICE Left 11/10/2020   Procedure: PLACEMENT OF IMPELLA 5.5 LEFT VENTRICULAR ASSIST DEVICE VIA  LEFT AVILLARY ARTERY;  Surgeon: Wonda Olds, MD;  Location: [facility];  Service: Open Heart Surgery;  Laterality: Left;   REMOVAL OF IMPELLA LEFT VENTRICULAR ASSIST DEVICE Right 12/04/2019   Procedure: REMOVAL OF IMPELLA LEFT VENTRICULAR ASSIST DEVICE, right axilla;  Surgeon: Wonda Olds, MD;  Location: [facility];  Service: Open Heart Surgery;  Laterality: Right;   RIGHT/LEFT HEART CATH AND CORONARY ANGIOGRAPHY N/A 11/24/2019   Procedure: RIGHT/LEFT HEART CATH AND CORONARY ANGIOGRAPHY;  Surgeon: Larey Dresser, MD;  Location: [facility];  Service: Cardiovascular;  Laterality: N/A;   RIGHT/LEFT HEART CATH AND CORONARY/GRAFT ANGIOGRAPHY  N/A 11/09/2020   Procedure: RIGHT/LEFT HEART CATH AND CORONARY/GRAFT ANGIOGRAPHY;  Surgeon: Nelva Bush, MD;  Location: [facility];  Service: Cardiovascular;  Laterality: N/A;   STERNAL WOUND DEBRIDEMENT N/A 05/16/2022   Procedure: VAD DRIVELINE DEBRIDEMENT;  Surgeon: [name], MD;  Location: [facility];  Service: Thoracic;  Laterality: N/A;  VANC PULSE LAVAGE   STERNAL WOUND DEBRIDEMENT Left 06/14/2022   Procedure: DEBRIDEMENT OF VAD POWERCORD TUNNEL;  Surgeon: [name], MD;  Location: [facility];  Service: Thoracic;  Laterality: Left;   STERNAL WOUND DEBRIDEMENT N/A 06/27/2022   Procedure: ABDOMINAL WOUND DEBRIDEMENT, APPLICATION OF [place]RI[place];  Surgeon: [name], MD;  Location: [facility];  Service: Thoracic;  Laterality: N/A;   STERNAL WOUND DEBRIDEMENT N/A 07/08/2022   Procedure: ABDOMINAL WOUND DEBRIDEMENT;  Surgeon: [name], MD;  Location: [facility];  Service: Thoracic;  Laterality: N/A;   TEE WITHOUT CARDIOVERSION N/A 11/24/2019   Procedure: TRANSESOPHAGEAL ECHOCARDIOGRAM (TEE);  Surgeon: Wonda Olds, MD;  Location: [facility];  Service: Open Heart Surgery;  Laterality: N/A;   TEE WITHOUT CARDIOVERSION N/A 11/26/2019   Procedure: TRANSESOPHAGEAL ECHOCARDIOGRAM (TEE);  Surgeon: Wonda Olds, MD;  Location: [facility];  Service: Open Heart Surgery;  Laterality: N/A;   TEE WITHOUT CARDIOVERSION N/A 11/10/2020   Procedure: TRANSESOPHAGEAL ECHOCARDIOGRAM (TEE);  Surgeon: Wonda Olds, MD;  Location: [facility];  Service: Open Heart Surgery;  Laterality: N/A;   TEE WITHOUT CARDIOVERSION N/A 11/17/2020   Procedure: TRANSESOPHAGEAL ECHOCARDIOGRAM (TEE);  Surgeon: Wonda Olds, MD;  Location: [facility];  Service: Open Heart Surgery;  Laterality: N/A;   WOUND EXPLORATION N/A 05/31/2022   Procedure: WOUND EXPLORATION, Debridement and Irrigation of abdominal wound;  Surgeon: [name], MD;  [facility];  Service: Thoracic;  Laterality: N/A;    Social History   Socioeconomic  History   Marital status: Married    Spouse name: Not on file   Number of children: Not on file   Years of education: 12   Highest education level: Not on file  Occupational History   Not on file  Tobacco Use   Smoking status: Former    Packs/day: 1.00    Years: 30.00    Total pack years: 30.00    Types: Cigarettes    Quit date: 12/20/2019    Years since quitting: 2.6   Smokeless tobacco: Never  Vaping Use   Vaping Use: Never used  Substance and Sexual Activity   Alcohol use: Not Currently    Comment: Prior history of excessive intake   Drug use: No   Sexual activity: Not on file  Other Topics Concern   Not on file  Social History Narrative   Not on file   Social Determinants of  Health   Financial Resource Strain: Medium Risk (05/17/2022)   Overall Financial Resource Strain (CARDIA)    Difficulty of Paying Living Expenses: Somewhat hard  Food Insecurity: No Food Insecurity (09/10/2021)   Hunger Vital Sign    Worried About Running Out of Food in the Last Year: Never true    Ran Out of Food in the Last Year: Never true  Transportation Needs: Unmet Transportation Needs (09/10/2021)   PRAPARE - Hydrologist (Medical): Yes    Lack of Transportation (Non-Medical): No  Physical Activity: Inactive (11/10/2020)   Exercise Vital Sign    Days of Exercise per Week: 0 days    Minutes of Exercise per Session: 0 min  Stress: Not on file  Social Connections: Not on file    Family History  Problem Relation Age of Onset   Heart disease Sister        Tumor?   Arthritis Other    Lung disease Other    Asthma Other    Diabetes Other    Thyroid disease Neg Hx     Outpatient Encounter Medications as of 08/07/2022  Medication Sig   albuterol (PROVENTIL) (2.5 MG/3ML) 0.083% nebulizer solution INHALE 3 ML BY NEBULIZATION EVERY 6 HOURS AS NEEDED FOR WHEEZING OR SHORTNESS OF BREATH   albuterol (VENTOLIN HFA) 108 (90 Base) MCG/ACT inhaler Inhale 2 puffs into the  lungs every 6 (six) hours as needed for wheezing or shortness of breath.   allopurinol (ZYLOPRIM) 100 MG tablet Take 1 tablet (100 mg total) by mouth daily.   blood glucose meter kit and supplies KIT Dispense based on patient and insurance preference. Use to check CBG's three times a day. (FOR ICD-9 250.00, 250.01).   colchicine 0.6 MG tablet TAKE 1 TABLET (0.6 MG TOTAL) BY MOUTH AS NEEDED.   docusate sodium (COLACE) 100 MG capsule Take 200 mg by mouth 2 (two) times daily.   doxycycline (VIBRA-TABS) 100 MG tablet Take 1 tablet (100 mg total) by mouth every 12 (twelve) hours.   fluticasone furoate-vilanterol (BREO ELLIPTA) 100-25 MCG/ACT AEPB Inhale 1 puff into the lungs daily.   furosemide (LASIX) 40 MG tablet TAKE 0.5 TABLETS (20 MG TOTAL) BY MOUTH DAILY AS NEEDED. (Patient not taking: Reported on 08/05/2022)   gabapentin (NEURONTIN) 600 MG tablet Take 600 mg by mouth 2 (two) times daily.   glipiZIDE (GLUCOTROL) 5 MG tablet Take 1 tablet (5 mg total) by mouth daily.   guaiFENesin (MUCINEX) 600 MG 12 hr tablet Take 1 tablet (600 mg total) by mouth 2 (two) times daily. (Patient not taking: Reported on 08/05/2022)   loratadine (CLARITIN) 10 MG tablet Take 1 tablet (10 mg total) by mouth daily.   metFORMIN (GLUCOPHAGE) 500 MG tablet Take 1 tablet (500 mg total) by mouth 2 (two) times daily with a meal.   methimazole (TAPAZOLE) 5 MG tablet Take 1 tablet (5 mg total) by mouth daily with breakfast.   metoprolol succinate (TOPROL XL) 25 MG 24 hr tablet Take 1 tablet (25 mg total) by mouth daily.   Multiple Vitamin (MULTIVITAMIN WITH MINERALS) TABS tablet Take 1 tablet by mouth daily.   pantoprazole (PROTONIX) 40 MG tablet TAKE 1 TABLET BY MOUTH EVERY DAY   rosuvastatin (CRESTOR) 20 MG tablet Take 20 mg by mouth daily.   sertraline (ZOLOFT) 50 MG tablet Take 1 tablet (50 mg total) by mouth daily.   tamsulosin (FLOMAX) 0.4 MG CAPS capsule Take 0.4 mg by mouth daily.   Tetrahydrozoline HCl (  REDNESS RELIEVER  EYE DROPS OP) Apply 2 drops to eye daily as needed.   traMADol (ULTRAM) 50 MG tablet Take 1 tablet (50 mg total) by mouth 2 (two) times daily as needed.   traZODone (DESYREL) 150 MG tablet Take 1 tablet (150 mg total) by mouth at bedtime.   warfarin (COUMADIN) 4 MG tablet On 10/5 take 4 mg then resume 6 mg (1.5 tabs) on Mon/Wed/Fri and 4 mg (1 tab) all other days or as directed by HF Clinic   [DISCONTINUED] hydrALAZINE (APRESOLINE) 25 MG tablet Take 1 tablet (25 mg total) by mouth 3 (three) times daily.   [DISCONTINUED] metFORMIN (GLUCOPHAGE) 1000 MG tablet TAKE 1 TABLET (1,000 MG TOTAL) BY MOUTH IN THE MORNING AND AT BEDTIME.   [DISCONTINUED] methimazole (TAPAZOLE) 10 MG tablet Take 2 tablets (20 mg total) by mouth 2 (two) times daily.   No facility-administered encounter medications on file as of 08/07/2022.    ALLERGIES: No Known Allergies  VACCINATION STATUS: Immunization History  Administered Date(s) Administered   Influenza Split 01/17/2006, 10/25/2006, 12/01/2014, 11/04/2019   PNEUMOCOCCAL CONJUGATE-20 06/05/2022   Pneumococcal Polysaccharide-23 01/17/2006, 01/10/2008   Td,absorbed, Preservative Free, Adult Use, Lf Unspecified 07/28/2000   Tdap 08/24/2012    Diabetes He presents for his initial diabetic visit. He has type 2 diabetes mellitus. His disease course has been fluctuating. There are no hypoglycemic associated symptoms. Pertinent negatives for hypoglycemia include no confusion, headaches, pallor or seizures. Associated symptoms include polydipsia and polyuria. Pertinent negatives for diabetes include no chest pain, no fatigue, no polyphagia and no weakness. There are no hypoglycemic complications. Symptoms are improving. Diabetic complications include heart disease and nephropathy. Risk factors for coronary artery disease include dyslipidemia, diabetes mellitus, hypertension, male sex, sedentary lifestyle and tobacco exposure. Current diabetic treatments: He is currently on  glipizide 5 mg once a day, metformin 1000 mg p.o. twice daily. His weight is fluctuating minimally. He is following a generally unhealthy diet. When asked about meal planning, he reported none. He has not had a previous visit with a dietitian. (He did not bring his logs with him.  His recent labs show A1c of 8.2%.)  Hyperlipidemia This is a chronic problem. The current episode started more than 1 year ago. The problem is controlled. Associated symptoms include shortness of breath. Pertinent negatives include no chest pain or myalgias. Current antihyperlipidemic treatment includes statins. Risk factors for coronary artery disease include obesity, a sedentary lifestyle, dyslipidemia and diabetes mellitus.  Hypertension This is a chronic problem. The current episode started more than 1 year ago. The problem is controlled. Associated symptoms include shortness of breath. Pertinent negatives include no chest pain, headaches, neck pain or palpitations. Risk factors for coronary artery disease include dyslipidemia, diabetes mellitus, obesity, male gender, sedentary lifestyle and smoking/tobacco exposure. Past treatments include direct vasodilators, diuretics and beta blockers. Hypertensive end-organ damage includes kidney disease and heart failure.   Patient also has diagnosis of hypothyroidism, treated with high-dose methimazole for several month.  He is currently on methimazole 40 mg a day.  Recent labs are consistent with iatrogenic hypothyroidism.    Review of Systems  Constitutional:  Negative for chills, fatigue, fever and unexpected weight change.  HENT:  Negative for dental problem, mouth sores and trouble swallowing.   Eyes:  Negative for visual disturbance.  Respiratory:  Positive for shortness of breath. Negative for cough, choking, chest tightness and wheezing.        Exertional dyspnea.  Cardiovascular:  Negative for chest pain, palpitations and  leg swelling.  Gastrointestinal:  Negative for  abdominal distention, abdominal pain, constipation, diarrhea, nausea and vomiting.  Endocrine: Positive for polydipsia and polyuria. Negative for polyphagia.  Genitourinary:  Negative for dysuria, flank pain, hematuria and urgency.  Musculoskeletal:  Negative for back pain, gait problem, myalgias and neck pain.  Skin:  Negative for pallor, rash and wound.  Neurological:  Negative for seizures, syncope, weakness, numbness and headaches.  Psychiatric/Behavioral:  Negative for confusion and dysphoric mood.     Objective:       08/07/2022    1:26 PM 08/05/2022    9:03 AM 08/05/2022    8:36 AM  Vitals with BMI  Height $Remov'5\' 10"'rqDuve$     Weight 193 lbs  195 lbs 13 oz  BMI 97.94    Systolic 78 801 655  Diastolic 50 82 0  Pulse 68  90    BP (!) 78/50   Pulse 68   Ht $R'5\' 10"'cK$  (1.778 m)   Wt 193 lb (87.5 kg)   BMI 27.69 kg/m   Wt Readings from Last 3 Encounters:  08/07/22 193 lb (87.5 kg)  08/05/22 195 lb 12.8 oz (88.8 kg)  07/24/22 198 lb (89.8 kg)     Physical Exam Constitutional:      General: He is not in acute distress.    Appearance: He is well-developed.  HENT:     Head: Normocephalic and atraumatic.  Neck:     Thyroid: No thyromegaly.     Trachea: No tracheal deviation.  Cardiovascular:     Rate and Rhythm: Normal rate.     Pulses:          Dorsalis pedis pulses are 1+ on the right side and 1+ on the left side.       Posterior tibial pulses are 1+ on the right side and 1+ on the left side.     Heart sounds: Normal heart sounds, S1 normal and S2 normal. No murmur heard.    No gallop.  Pulmonary:     Effort: No respiratory distress.     Breath sounds: Normal breath sounds. No wheezing.  Abdominal:     General: Bowel sounds are normal. There is no distension.     Palpations: Abdomen is soft.     Tenderness: There is no abdominal tenderness. There is no guarding.  Musculoskeletal:     Right shoulder: No swelling or deformity.     Cervical back: Normal range of motion and  neck supple.  Skin:    General: Skin is warm and dry.     Findings: No rash.     Nails: There is no clubbing.  Neurological:     Mental Status: He is alert and oriented to person, place, and time.     Cranial Nerves: No cranial nerve deficit.     Sensory: No sensory deficit.     Gait: Gait normal.     Deep Tendon Reflexes: Reflexes are normal and symmetric.  Psychiatric:        Speech: Speech normal.        Behavior: Behavior normal. Behavior is cooperative.        Thought Content: Thought content normal.        Judgment: Judgment normal.       CMP ( most recent) CMP     Component Value Date/Time   NA 139 08/05/2022 0845   K 3.7 08/05/2022 0845   CL 104 08/05/2022 0845   CO2 26 08/05/2022 0845   GLUCOSE 155 (  H) 08/05/2022 0845   BUN 16 08/05/2022 0845   CREATININE 1.27 (H) 08/05/2022 0845   CALCIUM 8.9 08/05/2022 0845   PROT 6.9 06/12/2022 1230   ALBUMIN 2.7 (L) 06/12/2022 1230   AST 68 (H) 06/12/2022 1230   ALT 86 (H) 06/12/2022 1230   ALKPHOS 114 06/12/2022 1230   BILITOT 0.2 (L) 06/12/2022 1230   GFRNONAA >60 08/05/2022 0845   GFRAA >60 02/02/2020 1258     Diabetic Labs (most recent): Lab Results  Component Value Date   HGBA1C 8.2 (H) 05/13/2022   HGBA1C 8.9 (H) 09/09/2021   HGBA1C 10.4 (H) 11/10/2020     Lipid Panel ( most recent) Lipid Panel     Component Value Date/Time   CHOL 142 09/11/2021 0042   TRIG 95 09/11/2021 0042   HDL 62 09/11/2021 0042   CHOLHDL 2.3 09/11/2021 0042   VLDL 19 09/11/2021 0042   LDLCALC 61 09/11/2021 0042      Lab Results  Component Value Date   TSH 7.790 (H) 05/14/2022   TSH 0.135 (L) 03/11/2022   TSH <0.01 Repeated and verified X2. (L) 01/22/2022   TSH <0.010 (L) 01/09/2022   TSH <0.010 (L) 12/04/2021   TSH 0.660 07/26/2021   TSH 1.053 11/10/2020   TSH 1.658 11/20/2019   FREET4 1.51 (H) 03/11/2022   FREET4 2.97 (H) 01/22/2022   FREET4 2.80 (H) 01/09/2022   FREET4 3.08 (H) 12/04/2021   FREET4 1.05  07/26/2021   FREET4 1.75 (H) 11/10/2020           Assessment & Plan:   1. Hyperthyroidism -Due to his recent labs showing iatrogenic hypothyroidism, he is advised to lower his methimazole to only 5 mg p.o. daily at breakfast.  He is currently taking 40 mg p.o. daily.  We will have repeat labs including TSH, free T4/free T3, thyroglobulin antibodies.  In light of his asymptomatic hypotension, his overall a.m. cortisol is low.    2. Type 2 diabetes mellitus without complication, without long-term current use of insulin (HCC) Recent A1c is 8.2%.  He is advised to lower metformin to 500 mg p.o. twice daily due to CKD.  He is advised to continue glipizide 5 mg p.o. once a day at breakfast.  He is advised to continue monitoring blood glucose twice a day-daily before breakfast and at bedtime.  3. Hypotension, unspecified hypotension type Patient has home blood pressure monitor.  He is advised to measure blood pressure at least once a day and report if they are still less than 100/60.  He is advised to discontinue hydralazine, continue metoprolol 25 mg p.o. once a day and Lasix as needed.   Colin Ina has currently uncontrolled symptomatic type 2 DM , hypertension, CHF, hyperlipidemia, recent A1c of 8.2%.  - I had a long discussion with him about the possible risk factors and  the pathology behind its diabetes and its complications. -his diabetes is complicated by CKD, CHF, and he remains at a high risk for more acute and chronic complications which include CAD, CVA, CKD, retinopathy, and neuropathy. These are all discussed in detail with him.  - I discussed all available options of managing his diabetes including de-escalation of medications. I have counseled him on Food as Medicine by adopting a Whole Food , Plant Predominant  ( WFPP) nutrition as recommended by [employer] of Lifestyle Medicine. Patient is encouraged to switch to  unprocessed or minimally processed  complex  starch, adequate protein intake (mainly plant source), minimal liquid  fat, plenty of fruits, and vegetables.  - he acknowledges that there is a room for improvement in his food and drink choices. - Further Specific Suggestion is made for him to avoid simple carbohydrates  from his diet including Cakes, Sweet Desserts, Ice Cream, Soda (diet and regular), Sweet Tea, Candies, Chips, Cookies, Store Bought Juices, Alcohol ,  Artificial Sweeteners,  Coffee Creamer, and "Sugar-free" Products. This will help patient to have more stable blood glucose profile and potentially avoid unintended weight gain.   - I have approached him with the following individualized plan to manage  his diabetes and patient agrees:  - Specific targets for  A1c;  LDL, HDL,  and Triglycerides were discussed with the patient.  2) Blood Pressure /Hypertension: Controlled,  see above.    3) Lipids/Hyperlipidemia:   Review of his recent lipid panel showed  controlled  LDL at 61 .  he  is advised to continue    Crestor 20 mg daily at bedtime.  Side effects and precautions discussed with him.  4)  Weight/Diet:  Body mass index is 27.69 kg/m.  -   clearly complicating his diabetes care.   he is  a candidate for weight loss. I discussed with him the fact that loss of 5 - 10% of his  current body weight will have the most impact on his diabetes management.  The above detailed  ACLM recommendations for nutrition, exercise, sleep, social life, avoidance of risky substances, the need for restorative sleep   information will also detailed on discharge instructions.  5) Chronic Care/Health Maintenance:  -he  is on Statin medications and  is encouraged to initiate and continue to follow up with Ophthalmology, Dentist,  Podiatrist at least yearly or according to recommendations, and advised to   stay away from smoking. I have recommended yearly flu vaccine and pneumonia vaccine at least every 5 years; moderate intensity exercise for up to 150  minutes weekly; and  sleep for 7- 9 hours a day.  - he is  advised to maintain close follow up with Ralph Leyden, FNP for primary care needs, as well as his other providers for optimal and coordinated care.   I spent 51 minutes in the care of the patient today including review of labs from [facility], Lipids, Thyroid Function, Hematology (current and previous including abstractions from other facilities); face-to-face time discussing  his blood glucose readings/logs, discussing hypoglycemia and hyperglycemia episodes and symptoms, medications doses, his options of short and long term treatment based on the latest standards of care / guidelines;  discussion about incorporating lifestyle medicine;  and documenting the encounter. Risk reduction counseling performed per USPSTF guidelines to reduce cardiovascular risk factors.      Please refer to Patient Instructions for Blood Glucose Monitoring and Insulin/Medications Dosing Guide"  in media tab for additional information. Please  also refer to " Patient Self Inventory" in the Media  tab for reviewed elements of pertinent patient history.  Colin Ina participated in the discussions, expressed understanding, and voiced agreement with the above plans.  All questions were answered to his satisfaction. he is encouraged to contact clinic should he have any questions or concerns prior to his return visit.   Follow up plan: - Return in about 5 weeks (around 09/11/2022), or Call Back if Blood Pressure is below 100/60, for F/U with Pre-visit Labs, A1c -NV.  Glade Lloyd, MD [facility] [address] [place], [place] [address] Phone: [phone]  Fax: [phone]    08/07/2022, 3:01 PM  This note was partially dictated with voice recognition software. Similar sounding words can be transcribed inadequately or may not  be corrected upon review.

## 2022-08-13 ENCOUNTER — Ambulatory Visit (HOSPITAL_COMMUNITY): Payer: Self-pay | Admitting: Pharmacist

## 2022-08-13 LAB — POCT INR: INR: 1.8 — AB (ref 2.0–3.0)

## 2022-08-14 ENCOUNTER — Other Ambulatory Visit (HOSPITAL_COMMUNITY): Payer: 59

## 2022-08-16 ENCOUNTER — Telehealth: Payer: Self-pay | Admitting: *Deleted

## 2022-08-16 NOTE — Telephone Encounter (Signed)
Pt unable to come for clinic appt this week due to transportation issues. Pt was suppose to call and reschedule appt, but we did not hear from him.  Spoke with pt's wife this afternoon. Pt is scheduled for IV Feraheme 9/13 at 10:00. Will plan to see pt in [facility] clinic for wound vac change 9/13 at 8:30. She verbalized understanding.  Alyce Pagan RN VAD Coordinator  Office: [phone]  24/7 Pager: [phone]

## 2022-08-21 ENCOUNTER — Encounter (HOSPITAL_COMMUNITY)
Admission: RE | Admit: 2022-08-21 | Discharge: 2022-08-21 | Disposition: A | Discharge status: Home / Self Care | Payer: 59 | Source: Ambulatory Visit | Attending: Cardiology | Admitting: Cardiology

## 2022-08-21 ENCOUNTER — Ambulatory Visit (HOSPITAL_COMMUNITY)
Admission: RE | Admit: 2022-08-21 | Discharge: 2022-08-21 | Disposition: A | Discharge status: Home / Self Care | Payer: 59 | Source: Ambulatory Visit | Attending: Cardiology | Admitting: Cardiology

## 2022-08-21 DIAGNOSIS — D509 Iron deficiency anemia, unspecified: Secondary | ICD-10-CM | POA: Diagnosis present

## 2022-08-21 DIAGNOSIS — Z95811 Presence of heart assist device: Secondary | ICD-10-CM | POA: Insufficient documentation

## 2022-08-21 DIAGNOSIS — Z4801 Encounter for change or removal of surgical wound dressing: Secondary | ICD-10-CM | POA: Insufficient documentation

## 2022-08-21 MED ORDER — SODIUM CHLORIDE 0.9 % IV SOLN
510.0000 mg | INTRAVENOUS | Status: DC
  Administered 2022-08-21: 510 mg via INTRAVENOUS
  Filled 2022-08-21: qty 510

## 2022-08-21 NOTE — Progress Notes (Signed)
Pt presented to VAD clinic for wound care this morning with his wife Kevin Underwood.   Kevin Underwood has been changing his dressing every other day. Reports minimal drainage, no foul odor.   Dressing changed today by Dr Donata Clay. Due to product placement in wound bed Kevin Underwood has been instructed to leave Rylon sheet in wound bed with dressing changes. She may continue to change every other day wet to dry on top of Rylon sheet. If dressing saturated, or Rylon sheet becomes dislodged she is to change dressing daily wet to dry. She verbalized understanding of all instructions.   Exit site care: Existing VAD dressing removed and site care performed using sterile technique. Drive line exit site cleaned with hypochlorous spray allowed to dry. Dr Zenaida Niece Trigt gently debrided wound bed using sterile 4 x 4. Supra SDRM product placed in wound bed, fixated with Rylon sheet. Covered with hypochlorous moistened 4 x 4 and topped with dry 4 x 4. Exit site healing/filling in, wound bed beefy red. Site tunnels approximately 2 cm.  No redness, tenderness, foul odor or rash noted. Minimal yellow drainage on packing.  Drive line anchor re-applied. Pt denies fever or chills. Provided with 14 daily kits, a box of 4 x 4 gauze, and saline syringes for home use.       Supra SDRM Lot # [id number]  Use by: 9244-62 Rylon 5 x 5 inches sheet Lot # [id number]  Exp: 2024-10  Plan:  Pt to return to clinic in 1 week for wound care with Dr Donata Clay. Change dressing every other day, wet to dry, leaving Rylon sheet in place. If Rylon sheet dislodges, wet to dry daily.   Alyce Pagan RN VAD Coordinator  Office: [phone]  24/7 Pager: [phone]

## 2022-08-22 ENCOUNTER — Ambulatory Visit (HOSPITAL_COMMUNITY): Payer: Self-pay | Admitting: Pharmacist

## 2022-08-22 ENCOUNTER — Other Ambulatory Visit: Payer: Self-pay | Admitting: "Endocrinology

## 2022-08-22 DIAGNOSIS — E0590 Thyrotoxicosis, unspecified without thyrotoxic crisis or storm: Secondary | ICD-10-CM

## 2022-08-22 LAB — POCT INR: INR: 2.4 (ref 2.0–3.0)

## 2022-08-28 ENCOUNTER — Other Ambulatory Visit (HOSPITAL_COMMUNITY): Payer: Self-pay

## 2022-08-28 ENCOUNTER — Encounter (HOSPITAL_COMMUNITY): Payer: 59

## 2022-08-28 ENCOUNTER — Other Ambulatory Visit (HOSPITAL_COMMUNITY): Payer: 59

## 2022-08-28 ENCOUNTER — Encounter (HOSPITAL_COMMUNITY): Payer: Self-pay

## 2022-08-29 ENCOUNTER — Encounter (HOSPITAL_COMMUNITY): Payer: 59

## 2022-09-02 ENCOUNTER — Ambulatory Visit (HOSPITAL_COMMUNITY): Payer: Self-pay | Admitting: Pharmacist

## 2022-09-02 LAB — POCT INR: INR: 1.8 — AB (ref 2.0–3.0)

## 2022-09-03 ENCOUNTER — Encounter: Payer: Self-pay | Admitting: *Deleted

## 2022-09-03 ENCOUNTER — Other Ambulatory Visit (HOSPITAL_COMMUNITY): Payer: Self-pay | Admitting: *Deleted

## 2022-09-03 ENCOUNTER — Telehealth (HOSPITAL_COMMUNITY): Payer: Self-pay | Admitting: *Deleted

## 2022-09-03 DIAGNOSIS — Z95811 Presence of heart assist device: Secondary | ICD-10-CM

## 2022-09-03 DIAGNOSIS — Z7901 Long term (current) use of anticoagulants: Secondary | ICD-10-CM

## 2022-09-03 NOTE — Telephone Encounter (Signed)
Received call from patient's wife reporting pt not feeling well- lightheaded, dizzy, "acting funny like he did when he was infected." Reports he has been leaving the house, is gone all day, and when he comes home drive line dressing is "hanging off / open." Reports increased redness, drainage, and pain at exit site. She changed dressing 3 times yesterday. Reports pt has been showering, getting dressing wet, and touching site with his hands despite advisement not to shower/get dressing wet.   States he has been "smoking and using" since hospital discharge, and that he stops "using" 3 days prior to coming for appts, and she is concerned he may be withdrawing as he has been in bed "not feeling well" x 2 days.   Discussed the above with Dr Aundra Dubin. Will plan to see pt in VAD clinic tomorrow morning at 0900. [name] aware of appt information and verbalized understanding.   Emerson Monte RN [place] Coordinator  Office: [phone]  24/7 Pager: [phone]

## 2022-09-04 ENCOUNTER — Telehealth (HOSPITAL_COMMUNITY): Payer: Self-pay | Admitting: *Deleted

## 2022-09-04 ENCOUNTER — Encounter (HOSPITAL_COMMUNITY): Payer: Self-pay

## 2022-09-04 ENCOUNTER — Telehealth: Payer: Self-pay

## 2022-09-04 ENCOUNTER — Ambulatory Visit (HOSPITAL_COMMUNITY)
Admission: RE | Admit: 2022-09-04 | Discharge: 2022-09-04 | Disposition: A | Discharge status: Home / Self Care | Payer: 59 | Source: Ambulatory Visit | Attending: Internal Medicine | Admitting: Internal Medicine

## 2022-09-04 ENCOUNTER — Encounter (HOSPITAL_COMMUNITY): Payer: 59

## 2022-09-04 ENCOUNTER — Telehealth (INDEPENDENT_AMBULATORY_CARE_PROVIDER_SITE_OTHER): Payer: 59 | Admitting: Infectious Diseases

## 2022-09-04 VITALS — BP 130/0 | HR 83 | Wt 185.4 lb

## 2022-09-04 DIAGNOSIS — Z7901 Long term (current) use of anticoagulants: Secondary | ICD-10-CM | POA: Diagnosis not present

## 2022-09-04 DIAGNOSIS — M109 Gout, unspecified: Secondary | ICD-10-CM | POA: Insufficient documentation

## 2022-09-04 DIAGNOSIS — T829XXA Unspecified complication of cardiac and vascular prosthetic device, implant and graft, initial encounter: Secondary | ICD-10-CM

## 2022-09-04 DIAGNOSIS — I480 Paroxysmal atrial fibrillation: Secondary | ICD-10-CM | POA: Insufficient documentation

## 2022-09-04 DIAGNOSIS — J449 Chronic obstructive pulmonary disease, unspecified: Secondary | ICD-10-CM | POA: Insufficient documentation

## 2022-09-04 DIAGNOSIS — E0590 Thyrotoxicosis, unspecified without thyrotoxic crisis or storm: Secondary | ICD-10-CM | POA: Insufficient documentation

## 2022-09-04 DIAGNOSIS — F1510 Other stimulant abuse, uncomplicated: Secondary | ICD-10-CM | POA: Insufficient documentation

## 2022-09-04 DIAGNOSIS — A4902 Methicillin resistant Staphylococcus aureus infection, unspecified site: Secondary | ICD-10-CM | POA: Diagnosis not present

## 2022-09-04 DIAGNOSIS — D500 Iron deficiency anemia secondary to blood loss (chronic): Secondary | ICD-10-CM | POA: Diagnosis not present

## 2022-09-04 DIAGNOSIS — R42 Dizziness and giddiness: Secondary | ICD-10-CM | POA: Diagnosis not present

## 2022-09-04 DIAGNOSIS — Z951 Presence of aortocoronary bypass graft: Secondary | ICD-10-CM | POA: Insufficient documentation

## 2022-09-04 DIAGNOSIS — Z95811 Presence of heart assist device: Secondary | ICD-10-CM

## 2022-09-04 DIAGNOSIS — I2510 Atherosclerotic heart disease of native coronary artery without angina pectoris: Secondary | ICD-10-CM | POA: Diagnosis not present

## 2022-09-04 DIAGNOSIS — Z79899 Other long term (current) drug therapy: Secondary | ICD-10-CM | POA: Insufficient documentation

## 2022-09-04 DIAGNOSIS — I130 Hypertensive heart and chronic kidney disease with heart failure and stage 1 through stage 4 chronic kidney disease, or unspecified chronic kidney disease: Secondary | ICD-10-CM | POA: Diagnosis present

## 2022-09-04 DIAGNOSIS — T827XXA Infection and inflammatory reaction due to other cardiac and vascular devices, implants and grafts, initial encounter: Secondary | ICD-10-CM | POA: Diagnosis not present

## 2022-09-04 DIAGNOSIS — I5022 Chronic systolic (congestive) heart failure: Secondary | ICD-10-CM | POA: Diagnosis not present

## 2022-09-04 DIAGNOSIS — E1122 Type 2 diabetes mellitus with diabetic chronic kidney disease: Secondary | ICD-10-CM | POA: Diagnosis not present

## 2022-09-04 LAB — CBC
HCT: 34.3 % — ABNORMAL LOW (ref 39.0–52.0)
Hemoglobin: 11.7 g/dL — ABNORMAL LOW (ref 13.0–17.0)
MCH: 30 pg (ref 26.0–34.0)
MCHC: 34.1 g/dL (ref 30.0–36.0)
MCV: 87.9 fL (ref 80.0–100.0)
Platelets: 231 10*3/uL (ref 150–400)
RBC: 3.9 MIL/uL — ABNORMAL LOW (ref 4.22–5.81)
RDW: 15.7 % — ABNORMAL HIGH (ref 11.5–15.5)
WBC: 8.4 10*3/uL (ref 4.0–10.5)
nRBC: 0 % (ref 0.0–0.2)

## 2022-09-04 LAB — COMPREHENSIVE METABOLIC PANEL
ALT: 29 U/L (ref 0–44)
AST: 29 U/L (ref 15–41)
Albumin: 3.6 g/dL (ref 3.5–5.0)
Alkaline Phosphatase: 85 U/L (ref 38–126)
Anion gap: 4 — ABNORMAL LOW (ref 5–15)
BUN: 20 mg/dL (ref 8–23)
CO2: 30 mmol/L (ref 22–32)
Calcium: 9.1 mg/dL (ref 8.9–10.3)
Chloride: 103 mmol/L (ref 98–111)
Creatinine, Ser: 1.11 mg/dL (ref 0.61–1.24)
GFR, Estimated: >60 mL/min (ref >60)
Glucose, Bld: 124 mg/dL — ABNORMAL HIGH (ref 70–99)
Potassium: 3.7 mmol/L (ref 3.5–5.1)
Sodium: 137 mmol/L (ref 135–145)
Total Bilirubin: 0.3 mg/dL (ref 0.3–1.2)
Total Protein: 6.8 g/dL (ref 6.5–8.1)

## 2022-09-04 LAB — LACTATE DEHYDROGENASE: LDH: 161 U/L (ref 98–192)

## 2022-09-04 LAB — PROTIME-INR
INR: 2.1 — ABNORMAL HIGH (ref 0.8–1.2)
Prothrombin Time: 23.4 seconds — ABNORMAL HIGH (ref 11.4–15.2)

## 2022-09-04 MED ORDER — LEVOFLOXACIN 750 MG PO TABS: 750.0000 mg | ORAL_TABLET | Freq: Every day | ORAL | 0 refills | Status: DC

## 2022-09-04 MED ORDER — HYDRALAZINE HCL 25 MG PO TABS: 12.5000 mg | ORAL_TABLET | Freq: Three times a day (TID) | ORAL | 3 refills | Status: DC

## 2022-09-04 NOTE — Telephone Encounter (Signed)
Spoke with Ebony Hail with LVAD team regarding update on Kevin Underwood with the following:   Received call from patient's wife reporting pt not feeling well- lightheaded, dizzy, "acting funny like he did when he was infected." Reports he has been leaving the house, is gone all day, and when he comes home drive line dressing is "hanging off / open." Reports increased redness, drainage, and pain at exit site. She changed dressing 3 times yesterday. Reports pt has been showering, getting dressing wet, and touching site with his hands despite advisement not to shower/get dressing wet.   Based on pharmacy fill review he has been filling the doxycycline as expected. Not sure that he is actually taking BID as prescribed. Previous MIC's to doxy has been stable < 1 on multiple wound cultures. Either he is not taking doxy or this is new infection.  I do worry about new infection given water exposure and unclean hands touching dressing and wound bed. Sounds like he has active cellulitis as well but no need for debridement at this time per PVT's review today. Wound culture collected today at the visit - will start levaquin 750 mg QD for 7d while cultures finalize.   I do not think there is any utility to use dalvance at this time given his poor show rate for q2w follow ups. He is going to have a poor result either way we proceed unfortunately.   Would check INR on Friday with POC machine given possible interaction.  Also risk for hypoglycemia events if taken with glipizide - will need to be on the look out for that as well during this time.   The following discussed with LVAD team - will update Dr. Linus Salmons as well who saw him last in clinic 6w ago.    Janene Madeira, MSN, NP-C [facility] for Infectious Disease [email or web address] Pager: [phone] Office: [phone] [facility] Main Line: [place] Communication Welcome

## 2022-09-04 NOTE — Telephone Encounter (Signed)
Discussed pt's wound with Janene Madeira NP. (See Olivia RN's clinic note from today for further details.)  Wound culture pending. Will add Levaquin 750 mg daily x 1 week while awaiting culture results. Pt to continue BID Doxycycline.  Spoke with pt's wife [name] regarding the above. She verbalized understanding, and plans to pick up Levaquin later today.   Lauren PharmD aware of the above. Dr Aundra Dubin and Dr Prescott Gum aware of the above.   Emerson Monte RN [place] Coordinator  Office: [phone]  24/7 Pager: [phone]

## 2022-09-04 NOTE — Telephone Encounter (Addendum)
Spoke with Kevin Underwood. Provider is aware and is working with patient's cardiologist.   Cindi Carbon at [facility] and confirmed to proceed with filling levaquin per provider's order.  Binnie Kand, RN

## 2022-09-04 NOTE — Progress Notes (Addendum)
Patient presents for sick visit today with his wife [name]. [name] paged [place] Coordinator yesterday stating that pt was not feeling well. Reports pt has been lightheaded and dizzy with increased weakness and a staggering gait. [name] reported noncompliance with dressing change instructions. Reports pt has been showering, getting dressing wet, and touching site with his hands despite advisement not to shower/get dressing wet.   Pt denies heart failure symptoms, shortness of breath, and signs of bleeding. States he is tired all the time and stays in the bed most of the time. He complains of pain in his hands with no reported relief from over the counter medications in the past. Pt encouraged to follow up with PCP about arthritic pain. Dr Aundra Dubin made aware.  100+ PI events yesterday. Pt states he is staying well hydrated.   Pt had endocrinology appt 8/30 where he was advised to stop hydralazine. Pt hypertensive this appointment. Dr Aundra Dubin to restart hydralazine 12.94m TID. Pt and wife [facility] from outside providers. Pt stated understanding.  VAD Coordinator spoke with SJanene MadeiraNP regarding increased redness and drainage. Orders received for p.o. Levaquin 7589mdaily x 1 week. I&D to follow cultures.  Vital Signs:  Doppler Pressure: 130 Automatc BP: 116/96 (104) HR: 83 SPO2: 98    Weight: 185.4 lb w/o ept Last weight: 195.8 lb w/ ept   VAD Indication: Destination Therapy. HM III implanted 11/17/20 by Dr AtOrvan Seen VAD interrogation: Speed: 5600 Flow: 3.5 Power: 4.3 w    PI: 6.5 Hct: 30  Alarms: none  Events: 100+  Fixed speed: 5500 Low speed limit: 5200  Primary controller: replace back up battery in 28 months Back up controller: replace back up battery in 19 months   I reviewed the LVAD parameters from today and compared the results to the patient's prior recorded data. LVAD interrogation was NEGATIVE for significant power changes,  NEGATIVE for clinical alarms and STABLE for PI events/speed drops. No programming changes were made and pump is functioning within specified parameters. Pt is performing daily controller and system monitor self tests along with completing weekly and monthly maintenance for LVAD equipment.   LVAD equipment check completed and is in good working order. Back-up equipment not present.    Annual Equipment Maintenance on UBC/PM was performed on 01/09/2022.   Exit Site Care: Existing VAD dressing removed and site care performed using sterile technique by Dr VaDarcey NoraDrive line exit site cleaned with Chlora prep applicators x 2, allowed to dry. Hydrochlorous wound spray spray in wound bed and lightly debrided with a 4x4 by MD. Would cultures collected from site. Wound packed with gauze soaked in hydrochlorous solution following dry gauze.   Exit site healing, wound beefy red. Redness noted above anterior portion or wound bed.Wound has thick tan drainage. No tenderness, foul odor or rash noted. Large Tegaderm placed over dressing to ensure adherence to skin.  Drive line anchor re-applied. Pt denies fever or chills.     BP & Labs:  Doppler 130 - Doppler reflecting modified systolic   Hgb 1176.5 No S/S of bleeding. Specifically denies melena/BRBPR or nosebleeds. Occasional blood when wiping.    LDH 161 -  with established baseline of 190 - 360. Denies tea-colored urine. No power elevations noted on interrogation.     Plan: Restart hydralazine 12.93m58mID Coumadin dosing [facility] next Monday for wound check  OliBobbye Morton,BSN VAD Coordinator  Office:  [phone]  24/7 Pager: [phone]   Follow up for Heart Failure/LVAD:  61 y.o. 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 type 2 diabetes mellitus, COPD,  and hypertension. Now s/p Heartmate 3 LVAD.   Patient had echo in 10/21 with EF 20-25%.  He was admitted in 12/21 with cardiogenic shock.  Cath showed patent grafts and low cardiac output.  Suspect mixed ischemic/nonischemic cardiomyopathy, prior heavy ETOH though no ETOH or smoking since 12/20 CABG.  He required stabilization with Impella 5.5 and ultimately had placement of Heartmate 3 LVAD.  Paroxysmal atrial fibrillation noted post-op, but generally stable course and discharged home.    Patient was admitted in 10/22 with COPD exacerbation.  Wilder Glade was stopped with frequent PIs.  He was treated with steroids, nebs, antibiotics with improvement.   Ramp echo was done in 2/23.  At 5600 rpm the LV was small and the septum with some left shift, aortic valve opening 3/5 beats.  At 5500 rpm, the LV was larger and septum more midline, aortic valve still opening 3/5 beats.  The RV was moderately enlarged with normal systolic function. I left speed at 5500 rpm.   Patient had a prolonged admission in 6/23 and again in 7/23-8/23 with MRSA driveline infection.  He went to the OR multiple times for debridement and wound vac changes.  He completed vancomycin and is now on doxycycline.   Patient returns for followup of CHF/LVAD. He continues on doxycycline.  He has increased redness around his driveline today.  Seen by Dr. Prescott Gum, did not think that he needed admission for debridement. His wife actually thinks that the site has had less drainage recently. He has seen endocrinology and methimazole was cut back.  Endocrinologist stopped his hydralazine, MAP elevated today at 104.  He denies significant exertional dyspnea though he has generalized fatigue and arthritis-type pain in his fingers.  Some concern for ongoing methamphetamine abuse.   Denies LVAD alarms.  Reports taking Coumadin as prescribed and adherence to anticoagulation based dietary restrictions.  Denies bright red blood per rectum or melena, no dark  urine or hematuria.    Labs (12/21): K 4.1, creatinine 0.73, INR 2.4, hgb 12.3, LDH 194, LFTs normal, TSH normal Labs (1/22): hgb 14.4, creatinine 0.91, LDH 197 Labs (5/22): K 3.8, creatinine 1.44, hgb 15, LDH 177 Labs (10/22): K 4, creatinine 1.01 => 0.76, hgb 13.8, LDL 61, LDH 152 Labs (12/22): free T4 elevated with low TSH, creatinine 0.87.  Labs (4/23): Free T4 1.51, TSH 0.135, LDH 178, hgb 12.5, K 3.4, creatinine 1.07 Labs (8/23): creatinine 1.32 => 1.27, hgb 10.2, LDH 115, K 3.7   Past Medical History:  Diagnosis Date   "    Arthritis    CAD (coronary artery disease)    a. s/p CABG in 11/2019 with LIMA-LAD, SVG-OM1, SVG-PDA and SVG-D1   CHF (congestive heart failure) ([place])    a. EF < 20% by echo in 11/2019   Essential hypertension    PAF (paroxysmal atrial fibrillation) (HCC)    Type 2 diabetes mellitus (HCC)     Current Outpatient Medications  Medication Sig Dispense Refill   albuterol (PROVENTIL) (2.5 MG/3ML) 0.083% nebulizer solution INHALE 3 ML BY NEBULIZATION EVERY 6 HOURS AS NEEDED FOR WHEEZING OR SHORTNESS OF BREATH 450 mL 1   albuterol (VENTOLIN HFA) 108 (90 Base) MCG/ACT inhaler Inhale  2 puffs into the lungs every 6 (six) hours as needed for wheezing or shortness of breath.     allopurinol (ZYLOPRIM) 100 MG tablet Take 1 tablet (100 mg total) by mouth daily. 90 tablet 3   blood glucose meter kit and supplies KIT Dispense based on patient and insurance preference. Use to check CBG's three times a day. (FOR ICD-9 250.00, 250.01). 1 each 0   colchicine 0.6 MG tablet TAKE 1 TABLET (0.6 MG TOTAL) BY MOUTH AS NEEDED. 180 tablet 1   docusate sodium (COLACE) 100 MG capsule Take 200 mg by mouth 2 (two) times daily.     doxycycline (VIBRA-TABS) 100 MG tablet Take 1 tablet (100 mg total) by mouth every 12 (twelve) hours. 30 tablet 11   fluticasone furoate-vilanterol (BREO ELLIPTA) 100-25 MCG/ACT AEPB Inhale 1 puff into the lungs daily.     furosemide (LASIX) 40 MG tablet TAKE  0.5 TABLETS (20 MG TOTAL) BY MOUTH DAILY AS NEEDED. 45 tablet 3   gabapentin (NEURONTIN) 600 MG tablet Take 600 mg by mouth 2 (two) times daily.     glipiZIDE (GLUCOTROL) 5 MG tablet Take 1 tablet (5 mg total) by mouth daily. 30 tablet 11   guaiFENesin (MUCINEX) 600 MG 12 hr tablet Take 1 tablet (600 mg total) by mouth 2 (two) times daily. 60 tablet 6   hydrALAZINE (APRESOLINE) 25 MG tablet Take 0.5 tablets (12.5 mg total) by mouth 3 (three) times daily. 90 tablet 3   levofloxacin (LEVAQUIN) 750 MG tablet Take 1 tablet (750 mg total) by mouth daily. 7 tablet 0   loratadine (CLARITIN) 10 MG tablet Take 1 tablet (10 mg total) by mouth daily. 30 tablet 6   metFORMIN (GLUCOPHAGE) 500 MG tablet Take 1 tablet (500 mg total) by mouth 2 (two) times daily with a meal. 60 tablet 3   methimazole (TAPAZOLE) 5 MG tablet TAKE 1 TABLET BY MOUTH EVERY DAY WITH BREAKFAST 90 tablet 1   metoprolol succinate (TOPROL XL) 25 MG 24 hr tablet Take 1 tablet (25 mg total) by mouth daily. 30 tablet 6   Multiple Vitamin (MULTIVITAMIN WITH MINERALS) TABS tablet Take 1 tablet by mouth daily.     pantoprazole (PROTONIX) 40 MG tablet TAKE 1 TABLET BY MOUTH EVERY DAY 90 tablet 2   rosuvastatin (CRESTOR) 20 MG tablet Take 20 mg by mouth daily.     sertraline (ZOLOFT) 50 MG tablet Take 1 tablet (50 mg total) by mouth daily. 90 tablet 3   tamsulosin (FLOMAX) 0.4 MG CAPS capsule Take 0.4 mg by mouth daily.     Tetrahydrozoline HCl (REDNESS RELIEVER EYE DROPS OP) Apply 2 drops to eye daily as needed.     traMADol (ULTRAM) 50 MG tablet Take 1 tablet (50 mg total) by mouth 2 (two) times daily as needed. 30 tablet 1   traZODone (DESYREL) 150 MG tablet Take 1 tablet (150 mg total) by mouth at bedtime. 90 tablet 3   warfarin (COUMADIN) 4 MG tablet On 10/5 take 4 mg then resume 6 mg (1.5 tabs) on Mon/Wed/Fri and 4 mg (1 tab) all other days or as directed by HF Clinic 60 tablet 11   No current facility-administered medications for this  encounter.    Patient has no known allergies.  REVIEW OF SYSTEMS: All systems negative except as listed in HPI, PMH and Problem list.   LVAD INTERROGATION:  See nurse's note above.   I reviewed the LVAD parameters from today, and compared the results to the patient's  prior recorded data.  No programming changes were made.  The LVAD is functioning within specified parameters.  The patient performs LVAD self-test daily.  LVAD interrogation was negative for any significant power changes, alarms or PI events/speed drops.  LVAD equipment check completed and is in good working order.  Back-up equipment present.   LVAD education done on emergency procedures and precautions and reviewed exit site care.    Vitals:   09/04/22 1348 09/04/22 1349  BP: (!) 116/96 (!) 130/0  Pulse: 83   SpO2: 98%   Weight: 84.1 kg (185 lb 6.4 oz)   MAP 104  Physical Exam: General: Well appearing this am. NAD.  HEENT: Normal. Neck: Supple, JVP 7-8 cm. Carotids OK.  Cardiac:  Mechanical heart sounds with LVAD hum present.  Lungs:  CTAB, normal effort.  Abdomen:  NT, ND, no HSM. No bruits or masses. +BS  LVAD exit site: Mild redness at prior surgical site near driveline exit.  Extremities:  Warm and dry. No cyanosis, clubbing, rash, or edema.  Neuro:  Alert & oriented x 3. Cranial nerves grossly intact. Moves all 4 extremities w/o difficulty. Affect pleasant    ASSESSMENT AND PLAN: 1. 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  He has completed vancomycin and is on long-term doxycycline.  Driveline site with more redness but per wife less drainage.  Per Dr. Prescott Gum, does not need debridement at this time.  - Culture site.  - Discussed with ID, will transition from doxycycline to levofloxacin.  2.  Chronic systolic CHF: Echo 67/89 with EF 20-25%, mildly decreased RV function. LHC/RHC in 12/21 with patent grafts, low  output. Suspect mixed ischemic/nonischemic cardiomyopathy (prior heavy ETOH and drugs as well as CAD).  No ETOH, drugs, smoking since CABG in 12/20. Admitted with cardiogenic shock in 12/21, had placement of Impella 5.5 initially, now s/p Heartmate 3 LVAD on 11/17/20.  Ramp echo 2/23 with speed decreased to 5500 rpm. He would eventually like heart transplant. Speed now back to 5600 rpm.  MAP elevated today, hydralazine was stopped.   - Continue current Toprol XL and restart lower dose hydralazine 12.5 mg tid.   - He does not need a diuretic.   - Continue warfarin, INR goal 2-2.5.  - BMET today.  3. 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 D/w Renal and feel like he may need higher perfusion pressures. Creatinine has been lower recently.  - Would consider eventual reassessment of renal artery stenosis and possible treatment  - BMET today.  4.  CAD: S/p CABG 12/20.  LHC pre-VAD with patent grafts, no target for intervention. - Continue statin.  5.  Atrial fibrillation: Paroxysmal.  S/p Maze and LA appendage clip with CABG in 12/20. Maintaining SR.  - Continue Toprol XL 25 mg daily.  - Off amiodarone with hyperthyroidism. 6.  Type 2 diabetes: Per PCP.  7. Methamphetamine abuse: Some concern that this continues.   8. 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  He is now off amiodarone. Followed by endocrinology, recently decreased methimazole.  - Continue methimazole 5 mg daily.  - Had endocrinology followup.   9.  Gout: On allopurinol 10. Anemia (chronic blood loss): CBC today.    Loralie Champagne 09/04/2022

## 2022-09-04 NOTE — Telephone Encounter (Signed)
Received call from [facility], pharmacist at [facility]. Stated they received a prescription for levaquin for this patient, but their records show the patient is taking amiodarone and trazodone. Requests advice on whether another antibiotic could be used due to potential side effects; if not, recommends follow up with cardiologist.  Requests call back at [phone]. Routed to provider.  Binnie Kand, RN

## 2022-09-09 ENCOUNTER — Ambulatory Visit (HOSPITAL_COMMUNITY)
Admission: RE | Admit: 2022-09-09 | Discharge: 2022-09-09 | Disposition: A | Discharge status: Home / Self Care | Payer: 59 | Source: Ambulatory Visit | Attending: Cardiology | Admitting: Cardiology

## 2022-09-09 DIAGNOSIS — Z452 Encounter for adjustment and management of vascular access device: Secondary | ICD-10-CM | POA: Insufficient documentation

## 2022-09-09 DIAGNOSIS — T827XXA Infection and inflammatory reaction due to other cardiac and vascular devices, implants and grafts, initial encounter: Secondary | ICD-10-CM

## 2022-09-09 NOTE — Progress Notes (Signed)
Patient presents for drive line dressing change today with his wife Kevin Underwood. Wife reports drainage has slowed down and she has only been changing dressing once a day as instructed. She reports patient has been more compliant with wound care instructions. Patient reminded not to shower. Patient verbalized understanding.  Exit Site Care: Existing VAD dressing removed and site care performed using sterile technique. Drive line exit site cleaned with Chlora prep applicators x 2, allowed to dry. Hydrochlorous wound spray spray in wound bed and lightly debrided with a 4x4 by MD. Would cultures collected from site. Wound packed with gauze soaked in hydrochlorous solution following dry gauze.   Exit site healing, wound beefy red. Redness noted above anterior portion or wound bed has improving.Wound has small amount of thick tan drainage. No tenderness, foul odor or rash noted. Large Tegaderm placed over dressing to ensure adherence to skin.  Drive line anchor re-applied. Pt denies fever or chills. Pt given 14 daily kits, saline and tegaderm for home use.   Plan: Follow up in clinic in 2 weeks for wound check and INR.  Bobbye Morton RN,BSN [place] Coordinator  Office: [phone]  24/7 Pager: [phone]

## 2022-09-10 ENCOUNTER — Ambulatory Visit (HOSPITAL_COMMUNITY): Payer: Self-pay | Admitting: Pharmacist

## 2022-09-10 ENCOUNTER — Other Ambulatory Visit (HOSPITAL_COMMUNITY): Payer: Self-pay

## 2022-09-10 ENCOUNTER — Other Ambulatory Visit (HOSPITAL_COMMUNITY): Payer: Self-pay | Admitting: Cardiology

## 2022-09-10 DIAGNOSIS — Z95811 Presence of heart assist device: Secondary | ICD-10-CM

## 2022-09-10 LAB — POCT INR: INR: 2.9 (ref 2.0–3.0)

## 2022-09-10 MED ORDER — DOXYCYCLINE HYCLATE 100 MG PO TABS: 100.0000 mg | ORAL_TABLET | Freq: Two times a day (BID) | ORAL | 11 refills | Status: DC

## 2022-09-10 MED ORDER — LEVOFLOXACIN 750 MG PO TABS: 750.0000 mg | ORAL_TABLET | Freq: Every day | ORAL | 0 refills | Status: DC

## 2022-09-10 NOTE — Progress Notes (Signed)
Levaquin prescription updated for additional week per ID recommendation. Prescription sent to pharmacy along with refill of Doxycycline.  Bobbye Morton RN, BSN VAD Coordinator 24/7 Pager [phone]

## 2022-09-11 ENCOUNTER — Ambulatory Visit: Payer: 59 | Admitting: "Endocrinology

## 2022-09-12 LAB — DRUG SCREEN 10 W/CONF, SERUM
Amphetamines, IA: POSITIVE ng/mL — AB
Barbiturates, IA: NEGATIVE ug/mL
Benzodiazepines, IA: NEGATIVE ng/mL
Cocaine & Metabolite, IA: NEGATIVE ng/mL
Methadone, IA: NEGATIVE ng/mL
Opiates, IA: NEGATIVE ng/mL
Oxycodones, IA: NEGATIVE ng/mL
Phencyclidine, IA: NEGATIVE ng/mL
Propoxyphene, IA: NEGATIVE ng/mL
THC(Marijuana) Metabolite, IA: NEGATIVE ng/mL

## 2022-09-12 LAB — AMPHETAMINES/MDMA,MS,WB/SP RFX
Amphetamine: 54 ng/mL
Amphetamines Confirmation: POSITIVE
MDA: NEGATIVE ng/mL
MDEA: NEGATIVE ng/mL
MDMA: NEGATIVE ng/mL
Methamphetamine: 198 ng/mL

## 2022-09-13 LAB — MINIMUM INHIBITORY CONC. (1 DRUG)

## 2022-09-13 LAB — MIC RESULT

## 2022-09-16 ENCOUNTER — Ambulatory Visit (HOSPITAL_COMMUNITY): Payer: Self-pay | Admitting: Pharmacist

## 2022-09-16 LAB — POCT INR: INR: 2.6 (ref 2.0–3.0)

## 2022-09-16 NOTE — Progress Notes (Signed)
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  Will see how he does with 2 weeks of the Doxy + levaquin combination and observe on doxy alone. If wound regresses will change to minocycline 200 mg BID for suppression.

## 2022-09-19 LAB — AEROBIC CULTURE W GRAM STAIN (SUPERFICIAL SPECIMEN): Gram Stain: NONE SEEN

## 2022-09-23 ENCOUNTER — Other Ambulatory Visit (HOSPITAL_COMMUNITY): Payer: 59

## 2022-09-23 ENCOUNTER — Encounter (HOSPITAL_COMMUNITY): Payer: Self-pay | Admitting: *Deleted

## 2022-09-25 ENCOUNTER — Ambulatory Visit (HOSPITAL_COMMUNITY): Payer: Self-pay | Admitting: Pharmacist

## 2022-09-25 ENCOUNTER — Other Ambulatory Visit (HOSPITAL_COMMUNITY): Payer: Self-pay | Admitting: *Deleted

## 2022-09-25 DIAGNOSIS — Z95811 Presence of heart assist device: Secondary | ICD-10-CM

## 2022-09-25 DIAGNOSIS — T827XXA Infection and inflammatory reaction due to other cardiac and vascular devices, implants and grafts, initial encounter: Secondary | ICD-10-CM

## 2022-09-25 DIAGNOSIS — Z7901 Long term (current) use of anticoagulants: Secondary | ICD-10-CM

## 2022-09-25 LAB — POCT INR: INR: 2.2 (ref 2.0–3.0)

## 2022-09-26 ENCOUNTER — Ambulatory Visit (HOSPITAL_COMMUNITY)
Admission: RE | Admit: 2022-09-26 | Discharge: 2022-09-26 | Disposition: A | Discharge status: Home / Self Care | Payer: 59 | Source: Ambulatory Visit | Attending: Cardiology | Admitting: Cardiology

## 2022-09-26 DIAGNOSIS — Z4801 Encounter for change or removal of surgical wound dressing: Secondary | ICD-10-CM | POA: Insufficient documentation

## 2022-09-26 DIAGNOSIS — Y828 Other medical devices associated with adverse incidents: Secondary | ICD-10-CM | POA: Insufficient documentation

## 2022-09-26 DIAGNOSIS — T827XXA Infection and inflammatory reaction due to other cardiac and vascular devices, implants and grafts, initial encounter: Secondary | ICD-10-CM | POA: Insufficient documentation

## 2022-09-26 NOTE — Progress Notes (Addendum)
Patient presents for drive line dressing change today with his wife [name]. Wife reports drainage has slowed down and she has only been changing dressing once a day as instructed. She reports patient has been more compliant with wound care instructions. Patient reminded not to shower. Patient verbalized understanding.  Pt states that he finished Levaquin last week and is currently taking Doxy 100 mg bid  Exit Site Care: Existing VAD dressing removed and site care performed using sterile technique. Drive line exit site cleaned with Chlora prep applicators x 2, allowed to dry by Dr Prescott Gum. Hydrochlorous wound spray spray in wound bed and lightly debrided with a 4x4 by MD. Would cultures collected from site. Unable to pack wound as it does not tunnel gauze soaked in hydrochlorous solution placed under the driveline following dry gauze.   Exit site healing, wound beefy red. Redness noted above anterior portion or wound bed has improving.Wound has small amount of thick yellow drainage with foul odor. No tenderness, or rash noted. Large Tegaderm placed over dressing to ensure adherence to skin.  Drive line anchor re-applied. Pt denies fever or chills. Pt given 14 daily kits and tegaderm for home use. Wife instructed to continue daily dressing changes.      Plan: Follow up Monday for wound care with PVT  [name] RN,BSN [place] Coordinator  Office: [phone]  24/7 Pager: [phone]

## 2022-09-29 ENCOUNTER — Emergency Department (HOSPITAL_COMMUNITY)
Admission: EM | Admit: 2022-09-29 | Discharge: 2022-09-29 | Disposition: A | Discharge status: Home / Self Care | Payer: 59 | Source: Home / Self Care | Attending: Emergency Medicine | Admitting: Emergency Medicine

## 2022-09-29 ENCOUNTER — Emergency Department (HOSPITAL_COMMUNITY): Payer: 59

## 2022-09-29 ENCOUNTER — Other Ambulatory Visit: Payer: Self-pay

## 2022-09-29 DIAGNOSIS — Z7984 Long term (current) use of oral hypoglycemic drugs: Secondary | ICD-10-CM | POA: Insufficient documentation

## 2022-09-29 DIAGNOSIS — Z79899 Other long term (current) drug therapy: Secondary | ICD-10-CM | POA: Insufficient documentation

## 2022-09-29 DIAGNOSIS — M79601 Pain in right arm: Secondary | ICD-10-CM | POA: Insufficient documentation

## 2022-09-29 DIAGNOSIS — L03113 Cellulitis of right upper limb: Secondary | ICD-10-CM | POA: Diagnosis not present

## 2022-09-29 DIAGNOSIS — E119 Type 2 diabetes mellitus without complications: Secondary | ICD-10-CM | POA: Insufficient documentation

## 2022-09-29 DIAGNOSIS — Z7901 Long term (current) use of anticoagulants: Secondary | ICD-10-CM | POA: Insufficient documentation

## 2022-09-29 DIAGNOSIS — I110 Hypertensive heart disease with heart failure: Secondary | ICD-10-CM | POA: Insufficient documentation

## 2022-09-29 DIAGNOSIS — M009 Pyogenic arthritis, unspecified: Secondary | ICD-10-CM | POA: Diagnosis not present

## 2022-09-29 DIAGNOSIS — I509 Heart failure, unspecified: Secondary | ICD-10-CM | POA: Insufficient documentation

## 2022-09-29 LAB — COMPREHENSIVE METABOLIC PANEL
ALT: 30 U/L (ref 0–44)
AST: 44 U/L — ABNORMAL HIGH (ref 15–41)
Albumin: 3.8 g/dL (ref 3.5–5.0)
Alkaline Phosphatase: 85 U/L (ref 38–126)
Anion gap: 14 (ref 5–15)
BUN: 22 mg/dL (ref 8–23)
CO2: 21 mmol/L — ABNORMAL LOW (ref 22–32)
Calcium: 8.9 mg/dL (ref 8.9–10.3)
Chloride: 100 mmol/L (ref 98–111)
Creatinine, Ser: 1.15 mg/dL (ref 0.61–1.24)
GFR, Estimated: >60 mL/min (ref >60)
Glucose, Bld: 138 mg/dL — ABNORMAL HIGH (ref 70–99)
Potassium: 4 mmol/L (ref 3.5–5.1)
Sodium: 135 mmol/L (ref 135–145)
Total Bilirubin: 1.2 mg/dL (ref 0.3–1.2)
Total Protein: 6.5 g/dL (ref 6.5–8.1)

## 2022-09-29 LAB — LACTATE DEHYDROGENASE: LDH: 332 U/L — ABNORMAL HIGH (ref 98–192)

## 2022-09-29 LAB — CBC WITH DIFFERENTIAL/PLATELET
Abs Immature Granulocytes: 0.03 10*3/uL (ref 0.00–0.07)
Basophils Absolute: 0 10*3/uL (ref 0.0–0.1)
Basophils Relative: 0 %
Eosinophils Absolute: 0.1 10*3/uL (ref 0.0–0.5)
Eosinophils Relative: 1 %
HCT: 31.1 % — ABNORMAL LOW (ref 39.0–52.0)
Hemoglobin: 11.2 g/dL — ABNORMAL LOW (ref 13.0–17.0)
Immature Granulocytes: 0 %
Lymphocytes Relative: 14 %
Lymphs Abs: 1.4 10*3/uL (ref 0.7–4.0)
MCH: 31.2 pg (ref 26.0–34.0)
MCHC: 36 g/dL (ref 30.0–36.0)
MCV: 86.6 fL (ref 80.0–100.0)
Monocytes Absolute: 0.8 10*3/uL (ref 0.1–1.0)
Monocytes Relative: 9 %
Neutro Abs: 7.2 10*3/uL (ref 1.7–7.7)
Neutrophils Relative %: 76 %
Platelets: 196 10*3/uL (ref 150–400)
RBC: 3.59 MIL/uL — ABNORMAL LOW (ref 4.22–5.81)
RDW: 14.4 % (ref 11.5–15.5)
WBC: 9.5 10*3/uL (ref 4.0–10.5)
nRBC: 0 % (ref 0.0–0.2)

## 2022-09-29 LAB — PROTIME-INR
INR: 4.3 (ref 0.8–1.2)
Prothrombin Time: 40.5 seconds — ABNORMAL HIGH (ref 11.4–15.2)

## 2022-09-29 LAB — TROPONIN I (HIGH SENSITIVITY)
Troponin I (High Sensitivity): 7 ng/L (ref <18)
Troponin I (High Sensitivity): 8 ng/L (ref <18)

## 2022-09-29 MED ORDER — TRAMADOL HCL 50 MG PO TABS
50.0000 mg | ORAL_TABLET | Freq: Once | ORAL | Status: AC
  Administered 2022-09-29: 50 mg via ORAL
  Filled 2022-09-29: qty 1

## 2022-09-29 MED ORDER — MORPHINE SULFATE (PF) 4 MG/ML IV SOLN
4.0000 mg | Freq: Once | INTRAVENOUS | Status: AC
  Administered 2022-09-29: 4 mg via INTRAMUSCULAR
  Filled 2022-09-29: qty 1

## 2022-09-29 NOTE — Discharge Instructions (Addendum)
You were seen in the emergency department today for right arm pain.  This is likely your gout.  Please resume taking your colchicine.  Please use ice as needed.  You also have a prescription for tramadol and can continue using this.  Please rest your wrist.  Please do not take your Coumadin tonight.  The LVAD coordinator will call you this week to discuss when to resume your Coumadin as your INR was high at 4.3.  Please return to the emergency department for any worsening concerns.

## 2022-09-29 NOTE — ED Triage Notes (Signed)
Patient called out for right arm/wrist pain that started this morning, pain 10/10.

## 2022-09-29 NOTE — ED Provider Notes (Signed)
[facility] EMERGENCY DEPARTMENT Provider Note   CSN: [id number] Arrival date & time: 09/29/22  5397     History  Chief Complaint  Patient presents with   Arm Pain    Right    Kevin Underwood. is a 61 y.o. male. With past medical history of T2DM, PAF, HTN, CHF with LVAD who presents to the emergency department with right arm pain.  States that symptoms began about 4 hours ago. Describes having pain start in his right wrist and has since moved up his arm. It is severe, constant and sharp pain. Denies numbness or tingling to the arm. States he has had arm pain that is primarily left sided since having his LVAD placed. Denies having chest pain, shortness of breath, palpitations, lightheadedness or dizziness, back pain.   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 States he has had good wound healing so far. Denies new drainage from the driveline. Additionally states he "accidentally" used methamphetamine 4 days ago.    Arm Pain       Home Medications Prior to Admission medications   Medication Sig Start Date End Date Taking? Authorizing Provider  albuterol (PROVENTIL) (2.5 MG/3ML) 0.083% nebulizer solution INHALE 3 ML BY NEBULIZATION EVERY 6 HOURS AS NEEDED FOR WHEEZING OR SHORTNESS OF BREATH 09/10/22   Larey Dresser, MD  albuterol (VENTOLIN HFA) 108 (90 Base) MCG/ACT inhaler Inhale 2 puffs into the lungs every 6 (six) hours as needed for wheezing or shortness of breath.    [provider]  allopurinol (ZYLOPRIM) 100 MG tablet Take 1 tablet (100 mg total) by mouth daily. 12/04/21   Larey Dresser, MD  blood glucose meter kit and supplies KIT Dispense based on patient and insurance preference. Use to check CBG's three times a day. (FOR ICD-9 250.00, 250.01). 09/25/20   Barton Dubois, MD  colchicine 0.6 MG tablet TAKE 1 TABLET (0.6 MG TOTAL) BY MOUTH AS NEEDED. 12/04/21   Larey Dresser, MD  docusate sodium (COLACE) 100 MG capsule Take 200 mg by mouth 2 (two) times daily.    [provider]  doxycycline (VIBRA-TABS) 100 MG tablet Take 1 tablet (100 mg total) by mouth every 12 (twelve) hours. 09/10/22   Larey Dresser, MD  fluticasone furoate-vilanterol (BREO ELLIPTA) 100-25 MCG/ACT AEPB Inhale 1 puff into the lungs daily.    [provider]  furosemide (LASIX) 40 MG tablet TAKE 0.5 TABLETS (20 MG TOTAL) BY MOUTH DAILY AS NEEDED. 11/13/21   Larey Dresser, MD  gabapentin (NEURONTIN) 600 MG tablet Take 600 mg by mouth 2 (two) times daily.    [provider]  glipiZIDE (GLUCOTROL) 5 MG tablet Take 1 tablet (5 mg total) by mouth daily. 06/03/22 06/03/23  Clegg, Amy D, NP  guaiFENesin (MUCINEX) 600 MG 12 hr tablet Take 1 tablet (600 mg total) by mouth 2 (two) times daily. 09/11/21   Clegg, Amy D, NP  hydrALAZINE (APRESOLINE) 25 MG tablet Take 0.5 tablets (12.5 mg total) by mouth 3 (three) times daily. 09/04/22 05/02/23  Larey Dresser, MD  levofloxacin (LEVAQUIN) 750 MG tablet Take 1 tablet (750 mg total) by mouth daily. 09/10/22   Larey Dresser, MD  loratadine (CLARITIN) 10 MG tablet Take 1 tablet (10 mg total) by mouth daily. 09/12/21   Clegg, Amy D, NP  metFORMIN (GLUCOPHAGE) 500 MG tablet Take 1 tablet (500 mg total) by mouth 2 (two)  times daily with a meal. 08/07/22   Nida, Denman George, MD  methimazole (TAPAZOLE) 5 MG tablet TAKE 1 TABLET BY MOUTH EVERY DAY WITH BREAKFAST 08/22/22   Roma Kayser, MD  metoprolol succinate (TOPROL XL) 25 MG 24 hr tablet Take 1 tablet (25 mg total) by mouth daily. 01/09/22   Laurey Morale, MD  Multiple Vitamin (MULTIVITAMIN WITH MINERALS) TABS tablet Take 1 tablet by mouth daily. 12/09/19   Gold, Deniece Portela E, PA-C  pantoprazole (PROTONIX) 40 MG tablet TAKE 1 TABLET BY MOUTH EVERY DAY 11/13/21   Laurey Morale, MD  rosuvastatin (CRESTOR) 20 MG tablet Take 20 mg by mouth daily.    [provider]   sertraline (ZOLOFT) 50 MG tablet Take 1 tablet (50 mg total) by mouth daily. 12/04/21   Laurey Morale, MD  tamsulosin (FLOMAX) 0.4 MG CAPS capsule Take 0.4 mg by mouth daily. 04/01/22   [provider]  Tetrahydrozoline HCl (REDNESS RELIEVER EYE DROPS OP) Apply 2 drops to eye daily as needed.    [provider]  traMADol (ULTRAM) 50 MG tablet Take 1 tablet (50 mg total) by mouth 2 (two) times daily as needed. 07/31/22   Laurey Morale, MD  traZODone (DESYREL) 150 MG tablet Take 1 tablet (150 mg total) by mouth at bedtime. 03/11/22   Laurey Morale, MD  warfarin (COUMADIN) 4 MG tablet On 10/5 take 4 mg then resume 6 mg (1.5 tabs) on Mon/Wed/Fri and 4 mg (1 tab) all other days or as directed by [facility] Clinic 09/12/21   Tonye Becket D, NP      Allergies    Patient has no known allergies.    Review of Systems   Review of Systems  Musculoskeletal:  Positive for arthralgias and myalgias.  All other systems reviewed and are negative.   Physical Exam Updated Vital Signs BP (!) 119/100   Pulse 80   Temp 98.3 F (36.8 C) (Oral)   Resp 18   Ht 5\' 10"  (1.778 m)   Wt 79.4 kg   SpO2 96%   BMI 25.11 kg/m  Physical Exam Vitals and nursing note reviewed.  Constitutional:      Appearance: He is obese. He is ill-appearing. He is not toxic-appearing.  HENT:     Head: Normocephalic and atraumatic.  Eyes:     General: No scleral icterus.    Extraocular Movements: Extraocular movements intact.     Pupils: Pupils are equal, round, and reactive to light.  Cardiovascular:     Comments: VAD hum present Pulmonary:     Effort: Pulmonary effort is normal. No respiratory distress.  Musculoskeletal:        General: Tenderness present. No swelling, deformity or signs of injury. Normal range of motion.     Cervical back: Neck supple.     Comments: Right arm without joint swelling, deformity, bruising, erythema. No trauma to the arm. Normal strength. Compartments soft. Sensation intact    Skin:    General: Skin is warm and dry.     Capillary Refill: Capillary refill takes less than 2 seconds.     Findings: No bruising, erythema or rash.  Neurological:     General: No focal deficit present.     Mental Status: He is alert and oriented to person, place, and time. Mental status is at baseline.  Psychiatric:        Mood and Affect: Mood normal.        Behavior: Behavior normal.  Thought Content: Thought content normal.        Judgment: Judgment normal.     ED Results / Procedures / Treatments   Labs (all labs ordered are listed, but only abnormal results are displayed) Labs Reviewed  COMPREHENSIVE METABOLIC PANEL - Abnormal; Notable for the following components:      Result Value   CO2 21 (*)    Glucose, Bld 138 (*)    AST 44 (*)    All other components within normal limits  CBC WITH DIFFERENTIAL/PLATELET - Abnormal; Notable for the following components:   RBC 3.59 (*)    Hemoglobin 11.2 (*)    HCT 31.1 (*)    All other components within normal limits  LACTATE DEHYDROGENASE - Abnormal; Notable for the following components:   LDH 332 (*)    All other components within normal limits  PROTIME-INR - Abnormal; Notable for the following components:   Prothrombin Time 40.5 (*)    INR 4.3 (*)    All other components within normal limits  RAPID URINE DRUG SCREEN, HOSP PERFORMED  TROPONIN I (HIGH SENSITIVITY)  TROPONIN I (HIGH SENSITIVITY)    EKG None  Radiology DG Chest Port 1 View  Result Date: 09/29/2022 CLINICAL DATA:  61 year old male with pain.  LVAD. EXAM: PORTABLE CHEST 1 VIEW COMPARISON:  Chest radiographs 08/05/2022 and earlier. FINDINGS: Portable AP semi upright view at 0903 hours. Visible LVAD and other mediastinal postoperative changes appears stable since August. Mediastinal contours remain within normal limits. Stable lung volumes. Allowing for portable technique the lungs are clear. Visualized tracheal air column is within normal limits.  Bilateral axillary surgical clips. Chronic left clavicle fracture. No acute osseous abnormality identified. IMPRESSION: Stable postoperative appearance of the chest. No acute cardiopulmonary abnormality. Electronically Signed   By: Genevie Ann M.D.   On: 09/29/2022 09:15    Procedures Procedures   Medications Ordered in ED Medications  traMADol (ULTRAM) tablet 50 mg (50 mg Oral Given 09/29/22 0928)  morphine (PF) 4 MG/ML injection 4 mg (4 mg Intramuscular Given 09/29/22 1018)    ED Course/ Medical Decision Making/ A&P Clinical Course as of 09/29/22 1234  Sun Sep 29, 2022  1142 Per LVAD coordinator - hold warfarin until LVAD and pharmacy calls him.  [LA]    Clinical Course User Index [LA] Mickie Hillier, PA-C                           Medical Decision Making Amount and/or Complexity of Data Reviewed Labs: ordered. Radiology: ordered.  Risk Prescription drug management.   [facility] [phone] - in house rounding.   This patient presents to the ED with chief complaint(s) of arm pain with pertinent past medical history of LVAD which further complicates the presenting complaint. The complaint involves an extensive differential diagnosis and also carries with it a high risk of complications and morbidity.    The differential diagnosis includes ACS, gout, pseudogout, arthritis, clot, MSK    Additional history obtained: Additional history obtained from EMS  Records reviewed previous admission documents, Care Everywhere/External Records, and Primary Care Documents  ED Course and Reassessment: 61 year old male who presents with right arm pain.  Pain is primarily in the right wrist. It is mildly swollen without erythema or warmth. Pain radiates throughout the arm.  Will rule out ACS giving his significant cardiac history on LVAD. Will also obtain PT/INR, LDH for LVAD coordinator.   Do not feel he needs imaging as this  is non-traumatic wrist pain.  Doubt septic joint given physical  exam. The arm is not swollen, so doubt DVT, TOS  EKG is stable. Troponin x2 negative, doubt ACS.  CXR with no acute findings.  Likely his known gout. He was treated with tramadol and IM morphine here. Regarding his wrist can follow-up outpatient. Instructed to decrease uric acid promoting foods, take his prescribed colchicine. He requested more prescription pain medication. He is already prescribed tramadol. I also discussed with LVAD coordinator who knows patient well, and discussed his substance abuse with methamphetamines at home. Will not prescribe further pain medication.   Regarding LVAD status - his INR is supratherapeutic at 4.3. I spoke with [name], RN LVAD coordinator who instructs to hold coumadin until pharmacy and she can discuss plan. She states they will contact patient and wife this week on plan to resume coumadin this week. Additionally his LDH is 332. He has know chronic driveline infection. Is maintained on doxycycline at this time. He just stopped PO levaquin and LDH jumped. LVAD coordinator will discuss restarting Levaquin OP at his visit this week.   Feel he is overall safe for discharge at this time. Discussed with wife and patient at bedside about holding coumadin and they verbalize understanding.   I discussed this case with my attending physician who cosigned this note including patient's presenting symptoms, physical exam, and planned diagnostics and interventions. Attending physician stated agreement with plan or made changes to plan which were implemented.   Attending physician assessed patient at bedside.   Independent labs interpretation:  The following labs were independently interpreted: CBC with stable anemia. CMP unremarkable, LDH 332, INR 4.3, Troponin x2 negative   Independent visualization of imaging: - I independently visualized the following imaging with scope of interpretation limited to determining acute life threatening conditions related to  emergency care: CXR, which revealed stable findings  Consultation: - Consulted or discussed management/test interpretation w/ external professional: LVAD Coordinator, [name], RN to notify patient is here. She states that Dr. Aundra Dubin, cards is aware the patient is here in ED. States to work him up for complaint. Requests we get LDH, PT/INR and UDS as well. States he is known substance abuse and has had drug seeking behavior in the past.   Consideration for admission or further workup: Social Determinants of health:  Final Clinical Impression(s) / ED Diagnoses Final diagnoses:  Right arm pain    Rx / DC Orders ED Discharge Orders     None         Mickie Hillier, PA-C 09/29/22 1234    Lajean Saver, MD 09/29/22 1410

## 2022-09-29 NOTE — ED Notes (Signed)
LVAD Coordinator and rapid response team aware that pt is here.

## 2022-09-29 NOTE — ED Notes (Signed)
Patient  being monitored to our LVAD system, tech at the bedside.

## 2022-09-29 NOTE — ED Notes (Signed)
Critical INR of 4.3 reported to

## 2022-09-29 NOTE — ED Notes (Signed)
Critical INR of 4.3, reported to [place], [place].

## 2022-09-30 ENCOUNTER — Telehealth (INDEPENDENT_AMBULATORY_CARE_PROVIDER_SITE_OTHER): Payer: 59 | Admitting: Infectious Diseases

## 2022-09-30 ENCOUNTER — Ambulatory Visit (HOSPITAL_COMMUNITY)
Admission: RE | Admit: 2022-09-30 | Discharge: 2022-09-30 | Disposition: A | Discharge status: Home / Self Care | Payer: 59 | Source: Ambulatory Visit | Attending: Internal Medicine | Admitting: Internal Medicine

## 2022-09-30 ENCOUNTER — Inpatient Hospital Stay (HOSPITAL_COMMUNITY)
Admit: 2022-09-30 | Discharge: 2022-10-03 | DRG: 603 | Disposition: A | Discharge status: Home / Self Care | Payer: 59 | Source: Ambulatory Visit | Attending: Cardiology | Admitting: Cardiology

## 2022-09-30 ENCOUNTER — Ambulatory Visit (HOSPITAL_COMMUNITY): Payer: Self-pay | Admitting: Pharmacist

## 2022-09-30 ENCOUNTER — Encounter (HOSPITAL_COMMUNITY): Payer: Self-pay

## 2022-09-30 ENCOUNTER — Inpatient Hospital Stay (HOSPITAL_COMMUNITY): Payer: 59

## 2022-09-30 ENCOUNTER — Other Ambulatory Visit (HOSPITAL_COMMUNITY): Payer: Self-pay | Admitting: *Deleted

## 2022-09-30 ENCOUNTER — Inpatient Hospital Stay (HOSPITAL_COMMUNITY)
Admission: RE | Admit: 2022-09-30 | Discharge: 2022-09-30 | Disposition: A | Discharge status: Home / Self Care | Payer: 59 | Source: Ambulatory Visit | Attending: Cardiology | Admitting: Cardiology

## 2022-09-30 ENCOUNTER — Other Ambulatory Visit (HOSPITAL_COMMUNITY): Payer: Self-pay | Admitting: Cardiology

## 2022-09-30 VITALS — BP 128/82 | HR 85 | Temp 98.7°F

## 2022-09-30 DIAGNOSIS — J441 Chronic obstructive pulmonary disease with (acute) exacerbation: Secondary | ICD-10-CM | POA: Diagnosis present

## 2022-09-30 DIAGNOSIS — I130 Hypertensive heart and chronic kidney disease with heart failure and stage 1 through stage 4 chronic kidney disease, or unspecified chronic kidney disease: Secondary | ICD-10-CM | POA: Insufficient documentation

## 2022-09-30 DIAGNOSIS — E1122 Type 2 diabetes mellitus with diabetic chronic kidney disease: Secondary | ICD-10-CM | POA: Insufficient documentation

## 2022-09-30 DIAGNOSIS — M25531 Pain in right wrist: Principal | ICD-10-CM | POA: Diagnosis present

## 2022-09-30 DIAGNOSIS — T462X5A Adverse effect of other antidysrhythmic drugs, initial encounter: Secondary | ICD-10-CM | POA: Diagnosis present

## 2022-09-30 DIAGNOSIS — T827XXA Infection and inflammatory reaction due to other cardiac and vascular devices, implants and grafts, initial encounter: Secondary | ICD-10-CM

## 2022-09-30 DIAGNOSIS — D500 Iron deficiency anemia secondary to blood loss (chronic): Secondary | ICD-10-CM | POA: Insufficient documentation

## 2022-09-30 DIAGNOSIS — M009 Pyogenic arthritis, unspecified: Secondary | ICD-10-CM | POA: Insufficient documentation

## 2022-09-30 DIAGNOSIS — R52 Pain, unspecified: Secondary | ICD-10-CM

## 2022-09-30 DIAGNOSIS — Z7901 Long term (current) use of anticoagulants: Secondary | ICD-10-CM | POA: Insufficient documentation

## 2022-09-30 DIAGNOSIS — Z825 Family history of asthma and other chronic lower respiratory diseases: Secondary | ICD-10-CM

## 2022-09-30 DIAGNOSIS — E0590 Thyrotoxicosis, unspecified without thyrotoxic crisis or storm: Secondary | ICD-10-CM | POA: Insufficient documentation

## 2022-09-30 DIAGNOSIS — I5022 Chronic systolic (congestive) heart failure: Secondary | ICD-10-CM | POA: Diagnosis present

## 2022-09-30 DIAGNOSIS — F1510 Other stimulant abuse, uncomplicated: Secondary | ICD-10-CM | POA: Diagnosis present

## 2022-09-30 DIAGNOSIS — Z7984 Long term (current) use of oral hypoglycemic drugs: Secondary | ICD-10-CM | POA: Diagnosis not present

## 2022-09-30 DIAGNOSIS — Z87891 Personal history of nicotine dependence: Secondary | ICD-10-CM | POA: Diagnosis not present

## 2022-09-30 DIAGNOSIS — M25431 Effusion, right wrist: Principal | ICD-10-CM | POA: Diagnosis present

## 2022-09-30 DIAGNOSIS — Z95811 Presence of heart assist device: Secondary | ICD-10-CM | POA: Diagnosis not present

## 2022-09-30 DIAGNOSIS — Z765 Malingerer [conscious simulation]: Secondary | ICD-10-CM

## 2022-09-30 DIAGNOSIS — R55 Syncope and collapse: Secondary | ICD-10-CM

## 2022-09-30 DIAGNOSIS — Z833 Family history of diabetes mellitus: Secondary | ICD-10-CM | POA: Diagnosis not present

## 2022-09-30 DIAGNOSIS — M109 Gout, unspecified: Secondary | ICD-10-CM | POA: Insufficient documentation

## 2022-09-30 DIAGNOSIS — I2510 Atherosclerotic heart disease of native coronary artery without angina pectoris: Secondary | ICD-10-CM | POA: Insufficient documentation

## 2022-09-30 DIAGNOSIS — L03113 Cellulitis of right upper limb: Principal | ICD-10-CM | POA: Diagnosis present

## 2022-09-30 DIAGNOSIS — Z79899 Other long term (current) drug therapy: Secondary | ICD-10-CM | POA: Insufficient documentation

## 2022-09-30 DIAGNOSIS — N183 Chronic kidney disease, stage 3 unspecified: Secondary | ICD-10-CM | POA: Diagnosis present

## 2022-09-30 DIAGNOSIS — A4902 Methicillin resistant Staphylococcus aureus infection, unspecified site: Secondary | ICD-10-CM

## 2022-09-30 DIAGNOSIS — Z8249 Family history of ischemic heart disease and other diseases of the circulatory system: Secondary | ICD-10-CM | POA: Diagnosis not present

## 2022-09-30 DIAGNOSIS — Z951 Presence of aortocoronary bypass graft: Secondary | ICD-10-CM | POA: Insufficient documentation

## 2022-09-30 DIAGNOSIS — B9562 Methicillin resistant Staphylococcus aureus infection as the cause of diseases classified elsewhere: Secondary | ICD-10-CM | POA: Diagnosis present

## 2022-09-30 DIAGNOSIS — I480 Paroxysmal atrial fibrillation: Secondary | ICD-10-CM | POA: Insufficient documentation

## 2022-09-30 DIAGNOSIS — Y831 Surgical operation with implant of artificial internal device as the cause of abnormal reaction of the patient, or of later complication, without mention of misadventure at the time of the procedure: Secondary | ICD-10-CM | POA: Diagnosis present

## 2022-09-30 LAB — COMPREHENSIVE METABOLIC PANEL
ALT: 22 U/L (ref 0–44)
AST: 18 U/L (ref 15–41)
Albumin: 3.3 g/dL — ABNORMAL LOW (ref 3.5–5.0)
Alkaline Phosphatase: 78 U/L (ref 38–126)
Anion gap: 12 (ref 5–15)
BUN: 19 mg/dL (ref 8–23)
CO2: 23 mmol/L (ref 22–32)
Calcium: 8.8 mg/dL — ABNORMAL LOW (ref 8.9–10.3)
Chloride: 103 mmol/L (ref 98–111)
Creatinine, Ser: 1.09 mg/dL (ref 0.61–1.24)
GFR, Estimated: >60 mL/min (ref >60)
Glucose, Bld: 128 mg/dL — ABNORMAL HIGH (ref 70–99)
Potassium: 3.5 mmol/L (ref 3.5–5.1)
Sodium: 138 mmol/L (ref 135–145)
Total Bilirubin: 0.3 mg/dL (ref 0.3–1.2)
Total Protein: 6.5 g/dL (ref 6.5–8.1)

## 2022-09-30 LAB — CBC
HCT: 30.3 % — ABNORMAL LOW (ref 39.0–52.0)
Hemoglobin: 10.6 g/dL — ABNORMAL LOW (ref 13.0–17.0)
MCH: 30.8 pg (ref 26.0–34.0)
MCHC: 35 g/dL (ref 30.0–36.0)
MCV: 88.1 fL (ref 80.0–100.0)
Platelets: 224 10*3/uL (ref 150–400)
RBC: 3.44 MIL/uL — ABNORMAL LOW (ref 4.22–5.81)
RDW: 14.5 % (ref 11.5–15.5)
WBC: 8.5 10*3/uL (ref 4.0–10.5)
nRBC: 0 % (ref 0.0–0.2)

## 2022-09-30 LAB — SURGICAL PCR SCREEN
MRSA, PCR: NEGATIVE
Staphylococcus aureus: NEGATIVE

## 2022-09-30 LAB — PROTIME-INR
INR: 4.5 (ref 0.8–1.2)
Prothrombin Time: 42.6 seconds — ABNORMAL HIGH (ref 11.4–15.2)

## 2022-09-30 LAB — GLUCOSE, CAPILLARY: Glucose-Capillary: 261 mg/dL — ABNORMAL HIGH (ref 70–99)

## 2022-09-30 LAB — AEROBIC CULTURE W GRAM STAIN (SUPERFICIAL SPECIMEN): Gram Stain: NONE SEEN

## 2022-09-30 LAB — C-REACTIVE PROTEIN: CRP: 5 mg/dL — ABNORMAL HIGH (ref <1.0)

## 2022-09-30 LAB — LACTATE DEHYDROGENASE: LDH: 148 U/L (ref 98–192)

## 2022-09-30 LAB — URIC ACID: Uric Acid, Serum: 2.9 mg/dL — ABNORMAL LOW (ref 3.7–8.6)

## 2022-09-30 LAB — SEDIMENTATION RATE: Sed Rate: 28 mm/hr — ABNORMAL HIGH (ref 0–16)

## 2022-09-30 MED ORDER — CHLORHEXIDINE GLUCONATE CLOTH 2 % EX PADS
6.0000 | MEDICATED_PAD | Freq: Every day | CUTANEOUS | Status: DC
  Administered 2022-10-01 – 2022-10-02 (×2): 6 via TOPICAL

## 2022-09-30 MED ORDER — ROSUVASTATIN CALCIUM 20 MG PO TABS
20.0000 mg | ORAL_TABLET | Freq: Every day | ORAL | Status: DC
  Administered 2022-10-01 – 2022-10-03 (×3): 20 mg via ORAL
  Filled 2022-09-30 (×3): qty 1

## 2022-09-30 MED ORDER — TRAMADOL HCL 50 MG PO TABS
50.0000 mg | ORAL_TABLET | Freq: Two times a day (BID) | ORAL | Status: DC | PRN
  Administered 2022-09-30 – 2022-10-02 (×4): 50 mg via ORAL
  Filled 2022-09-30 (×4): qty 1

## 2022-09-30 MED ORDER — METOPROLOL SUCCINATE ER 25 MG PO TB24
25.0000 mg | ORAL_TABLET | Freq: Every day | ORAL | Status: DC
  Administered 2022-10-01 – 2022-10-03 (×3): 25 mg via ORAL
  Filled 2022-09-30 (×3): qty 1

## 2022-09-30 MED ORDER — ONDANSETRON HCL 4 MG/2ML IJ SOLN: 4.0000 mg | Freq: Four times a day (QID) | INTRAMUSCULAR | Status: DC | PRN

## 2022-09-30 MED ORDER — ZINC SULFATE 220 (50 ZN) MG PO CAPS
220.0000 mg | ORAL_CAPSULE | Freq: Every day | ORAL | Status: DC
  Administered 2022-09-30 – 2022-10-03 (×4): 220 mg via ORAL
  Filled 2022-09-30 (×4): qty 1

## 2022-09-30 MED ORDER — SODIUM CHLORIDE 0.9 % IV SOLN
2.0000 g | Freq: Three times a day (TID) | INTRAVENOUS | Status: DC
  Administered 2022-09-30 – 2022-10-01 (×2): 2 g via INTRAVENOUS
  Filled 2022-09-30 (×4): qty 40

## 2022-09-30 MED ORDER — MINOCYCLINE HCL 100 MG PO CAPS: 200.0000 mg | ORAL_CAPSULE | Freq: Two times a day (BID) | ORAL | 3 refills | Status: DC

## 2022-09-30 MED ORDER — HYDRALAZINE HCL 25 MG PO TABS
12.5000 mg | ORAL_TABLET | Freq: Three times a day (TID) | ORAL | Status: DC
  Administered 2022-09-30 – 2022-10-03 (×8): 12.5 mg via ORAL
  Filled 2022-09-30 (×8): qty 1

## 2022-09-30 MED ORDER — SERTRALINE HCL 50 MG PO TABS
50.0000 mg | ORAL_TABLET | Freq: Every day | ORAL | Status: DC
  Administered 2022-10-01 – 2022-10-03 (×3): 50 mg via ORAL
  Filled 2022-09-30 (×3): qty 1

## 2022-09-30 MED ORDER — VANCOMYCIN HCL 1250 MG/250ML IV SOLN: 1250.0000 mg | INTRAVENOUS | Status: DC

## 2022-09-30 MED ORDER — VANCOMYCIN HCL 1750 MG/350ML IV SOLN
1750.0000 mg | Freq: Once | INTRAVENOUS | Status: AC
  Administered 2022-09-30: 1750 mg via INTRAVENOUS
  Filled 2022-09-30: qty 350

## 2022-09-30 MED ORDER — COLCHICINE 0.6 MG PO TABS
0.6000 mg | ORAL_TABLET | Freq: Every day | ORAL | Status: DC
  Administered 2022-09-30 – 2022-10-03 (×4): 0.6 mg via ORAL
  Filled 2022-09-30 (×4): qty 1

## 2022-09-30 MED ORDER — OXYCODONE HCL 5 MG PO TABS
5.0000 mg | ORAL_TABLET | Freq: Three times a day (TID) | ORAL | Status: DC | PRN
  Administered 2022-09-30 – 2022-10-03 (×8): 10 mg via ORAL
  Filled 2022-09-30 (×8): qty 2

## 2022-09-30 MED ORDER — VANCOMYCIN HCL 1500 MG/300ML IV SOLN
1500.0000 mg | INTRAVENOUS | Status: DC
  Administered 2022-10-01 – 2022-10-02 (×2): 1500 mg via INTRAVENOUS
  Filled 2022-09-30 (×2): qty 300

## 2022-09-30 MED ORDER — METHIMAZOLE 5 MG PO TABS
5.0000 mg | ORAL_TABLET | Freq: Every day | ORAL | Status: DC
  Administered 2022-10-01 – 2022-10-03 (×3): 5 mg via ORAL
  Filled 2022-09-30 (×3): qty 1

## 2022-09-30 MED ORDER — WARFARIN - PHARMACIST DOSING INPATIENT: Freq: Every day | Status: DC

## 2022-09-30 MED ORDER — PANTOPRAZOLE SODIUM 40 MG PO TBEC
40.0000 mg | DELAYED_RELEASE_TABLET | Freq: Every day | ORAL | Status: DC
  Administered 2022-10-01 – 2022-10-03 (×3): 40 mg via ORAL
  Filled 2022-09-30 (×3): qty 1

## 2022-09-30 MED ORDER — MUPIROCIN 2 % EX OINT
1.0000 | TOPICAL_OINTMENT | Freq: Two times a day (BID) | CUTANEOUS | Status: DC
  Administered 2022-10-01 – 2022-10-02 (×4): 1 via NASAL
  Filled 2022-09-30: qty 22

## 2022-09-30 MED ORDER — INSULIN ASPART 100 UNIT/ML IJ SOLN
0.0000 [IU] | Freq: Three times a day (TID) | INTRAMUSCULAR | Status: DC
  Administered 2022-10-01 – 2022-10-02 (×3): 2 [IU] via SUBCUTANEOUS

## 2022-09-30 MED ORDER — MORPHINE SULFATE (PF) 2 MG/ML IV SOLN
1.0000 mg | INTRAVENOUS | Status: AC | PRN
  Administered 2022-09-30 – 2022-10-01 (×2): 1 mg via INTRAVENOUS
  Filled 2022-09-30 (×2): qty 1

## 2022-09-30 MED ORDER — GABAPENTIN 600 MG PO TABS
600.0000 mg | ORAL_TABLET | Freq: Two times a day (BID) | ORAL | Status: DC
  Administered 2022-09-30 – 2022-10-02 (×4): 600 mg via ORAL
  Filled 2022-09-30 (×4): qty 1

## 2022-09-30 MED ORDER — ALLOPURINOL 100 MG PO TABS
100.0000 mg | ORAL_TABLET | Freq: Every day | ORAL | Status: DC
  Administered 2022-10-01 – 2022-10-03 (×3): 100 mg via ORAL
  Filled 2022-09-30 (×4): qty 1

## 2022-09-30 MED ORDER — TRAMADOL HCL 50 MG PO TABS: 50.0000 mg | ORAL_TABLET | Freq: Two times a day (BID) | ORAL | 1 refills | Status: DC | PRN

## 2022-09-30 MED ORDER — ENSURE ENLIVE PO LIQD: 237.0000 mL | Freq: Two times a day (BID) | ORAL | Status: AC

## 2022-09-30 MED ORDER — TAMSULOSIN HCL 0.4 MG PO CAPS
0.4000 mg | ORAL_CAPSULE | Freq: Every day | ORAL | Status: DC
  Administered 2022-10-01 – 2022-10-03 (×3): 0.4 mg via ORAL
  Filled 2022-09-30 (×3): qty 1

## 2022-09-30 MED ORDER — ACETAMINOPHEN 325 MG PO TABS: 650.0000 mg | ORAL_TABLET | ORAL | Status: DC | PRN

## 2022-09-30 NOTE — Progress Notes (Signed)
Pharmacy Antibiotic Note  Kevin Underwood. is a 61 y.o. male admitted on 09/30/2022 with VAD driveline infection.  Pharmacy has been consulted for vancomycin and meropenem dosing.  Historically, he has grown MRSA and acinetobacter baumanii species in driveline cultures. Followed by ID in outpatient setting. Previously on doxycycline long-term therapy with plan to switch to minocycline; however, not admitted and broadening to IV antibiotics  Renal function appears at baseline with Scr 1.09. He previously received vancomycin during last admission. While on 1000mg  Q24H, his AUC was 387 with Scr 1.2-1.36  Plan: Meropenem 2g Q8H IV Vancomycin 1750mg  IV once (22 mg/kg) then 1500mg  Q24H (eAUC 400-450, Scr 1.09-1.15) F/u cultures and ID recommendations Level assessment once at steady state    Temp (24hrs), Avg:98.7 F (37.1 C), Min:98.7 F (37.1 C), Max:98.7 F (37.1 C)  Recent Labs  Lab 09/29/22 0920 09/30/22 1513  WBC 9.5 8.5  CREATININE 1.15 1.09    Estimated Creatinine Clearance: 73.5 mL/min (by C-G formula based on SCr of 1.09 mg/dL).    No Known Allergies   Thank you for allowing pharmacy to be a part of this patient's care.  Merrilee Jansky, PharmD Clinical Pharmacist 09/30/2022 6:18 PM

## 2022-09-30 NOTE — Addendum Note (Signed)
Encounter addended by: Christinia Gully, RN on: 09/30/2022 5:44 PM  Actions taken: Vitals modified, Clinical Note Signed

## 2022-09-30 NOTE — Progress Notes (Signed)
Patient presents for drive line dressing change today with his wife [name].   Pt had a pre-syncopal episode in the parking deck prior to coming up to clinic. Pt states that he became very dizzy/lightheaded and had to lower himself to the ground. When pt arrived in clinic he was pale and diaphoretic. BP was normal.   Pt was in the ED yesterday for right wrist pain. Pt was told that he had gout but was d/c without meds for gout/pain. On assessment, pts right wrist is very red and indurated, swollen, hot and painful to touch.   Pt states that he finished Levaquin last week and is currently taking Doxy 100 mg bid. Plan was made with ID this morning to switch to Minocycline. However, pt is being admitted for possible septic joint vs. Cellulitis vs gout. Uric acid is low at 2.9  Vital Signs:  Doppler Pressure:  Automatc BP: 128/82 (110) HR: 85 SPO2: 98    Weight: 185.4 lb w/o ept Last weight: 195.8 lb w/ ept     VAD Indication: Destination Therapy. HM III implanted 11/17/20 by Dr Orvan Seen   VAD interrogation: Speed: 5600 Flow: 3.7 Power: 4.2w    PI: 5.6 Hct: 30   Alarms: none  Events: 100+  Fixed speed: 5500 Low speed limit: 5200   Primary controller: replace back up battery in 28 months Back up controller: replace back up battery in 19 months   I reviewed the LVAD parameters from today and compared the results to the patient's prior recorded data. LVAD interrogation was NEGATIVE for significant power changes, NEGATIVE for clinical alarms and STABLE for PI events/speed drops. No programming changes were made and pump is functioning within specified parameters. Pt is performing daily controller and system monitor self tests along with completing weekly and monthly maintenance for LVAD equipment.   LVAD equipment check completed and is in good working order. Back-up equipment not present.    Annual Equipment Maintenance on UBC/PM was performed on 01/09/2022.   Exit Site Care: Existing  VAD dressing removed and site care performed using sterile technique. Drive line exit site cleaned with Chlora prep applicators x 2, allowed to dry by Dr Prescott Gum. Hydrochlorous wound spray spray in wound bed and lightly debrided with a 4x4 by MD. Unable to pack wound as it does not tunnel gauze soaked in hydrochlorous solution placed under the driveline following dry gauze.   Exit site healing, wound beefy red. Redness noted above anterior portion or wound bed has improving. Wound has small amount of thick yellow drainage with foul odor. No tenderness, or rash noted. Large Tegaderm placed over dressing to ensure adherence to skin.  Drive line anchor re-applied. Pt denies fever or chills.  Pt will be a daily dressing change. Next dressing change is due 10/01/22.  BP & Labs:  Doppler 110 - Doppler reflecting modified systolic   Hgb 16.9 - No S/S of bleeding. Specifically denies melena/BRBPR or nosebleeds. Occasional blood when wiping.    LDH 148 -  with established baseline of 190 - 360. Denies tea-colored urine. No power elevations noted on interrogation.    Plan: Admit to [place] for IV antibiotics.  [name] RN,BSN [place] Coordinator  Office: [phone]  24/7 Pager: [phone]

## 2022-09-30 NOTE — H&P (Addendum)
Advanced Heart Failure VAD History and Physical Note   PCP-Cardiologist: Rozann Lesches, MD   Reason for Admission: Right wrist pain, possible septic arthritis  HPI:    61 y.o. 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 type 2 diabetes mellitus, COPD, and hypertension. Now s/p Heartmate 3 LVAD.    Patient had echo in 10/21 with EF 20-25%.  He was admitted in 12/21 with cardiogenic shock.  Cath showed patent grafts and low cardiac output.  Suspect mixed ischemic/nonischemic cardiomyopathy, prior heavy ETOH though no ETOH or smoking since 12/20 CABG.  He required stabilization with Impella 5.5 and ultimately had placement of Heartmate 3 LVAD.  Paroxysmal atrial fibrillation noted post-op, but generally stable course and discharged home.     Patient was admitted in 10/22 with COPD exacerbation.  Wilder Glade was stopped with frequent PIs.  He was treated with steroids, nebs, antibiotics with improvement.    Ramp echo was done in 2/23.  At 5600 rpm the LV was small and the septum with some left shift, aortic valve opening 3/5 beats.  At 5500 rpm, the LV was larger and septum more midline, aortic valve still opening 3/5 beats.  The RV was moderately enlarged with normal systolic function. I left speed at 5500 rpm.    Patient had a prolonged admission in 6/23 and again in 7/23-8/23 with MRSA driveline infection.  He went to the OR multiple times for debridement and wound vac changes.  He completed vancomycin and was placed on doxycycline.    Levofloxacin added d/t increased drainage and redness after last f/u on 09/27. Wound culture grew acinetobacter.  Wound initially improved but cultures repeated d/t decreased healing over last few dressing changes. Levofloxacin recently stopped and back on doxy.  Grew acinetobacter and MRSA on culture.  ICD recommended switching to minocycline 200 BID today based on culture results.   Seen in ED yesterday with right wrist pain. Pain felt to be d/t gout. WBC 9.5, LDH 161>332, INR 4.3.  He was felt to be stable for discharge home.    Reports he accidentally used hydrocodone laced with methamphetamine 5 days ago. Denies IV drug use.    Presented to VAD clinic for driveline dressing change today and had presyncopal episode in the parking lot resulting in a fall. Zio monitor placed in clinic. VAD parameters stable.    Noted worsening right wrist pain, warmth and swelling on arrival to VAD clinic. Wrist exquisitely tender with even light palpation. Denies fever, chills, nausea or vomiting. Denies IV drug use.   He is being direct admitted for empiric IV abx and aspiration of joint to rule out infection.     LVAD INTERROGATION:  HeartMate III LVAD Flow 4.0, Speed 5600, PI 4.1, Power 4.2. Numerous PI events (not unusual for him).  Review of Systems: [y] = yes, [ ]  = no   General: Weight gain [ ] ; Weight loss [ ] ; Anorexia [ ] ; Fatigue [ ] ; Fever [ ] ; Chills [ ] ; Weakness [ ]   Cardiac: Chest pain/pressure [ ] ; Resting SOB [ ] ; Exertional SOB [ ] ; Orthopnea [ ] ; Pedal Edema [ ] ; Palpitations [ ] ; Syncope [ ] ; Presyncope [Y ]; Paroxysmal nocturnal dyspnea[ ]   Pulmonary: Cough [ ] ; Wheezing[ ] ; Hemoptysis[ ] ; Sputum [ ] ; Snoring [ ]   GI: Vomiting[ ] ; Dysphagia[ ] ; Melena[ ] ; Hematochezia [ ] ; Heartburn[ ] ;  Abdominal pain [ ] ; Constipation [ ] ; Diarrhea [ ] ; BRBPR [ ]   GU: Hematuria[ ] ; Dysuria [ ] ; Nocturia[ ]   Vascular: Pain in legs with walking [ ] ; Pain in feet with lying flat [ ] ; Non-healing sores [ ] ; Stroke [ ] ; TIA [ ] ; Slurred speech [ ] ;  Neuro: Headaches[ ] ; Vertigo[ ] ; Seizures[ ] ; Paresthesias[ ] ;Blurred vision [ ] ; Diplopia [ ] ; Vision changes [ ]   Ortho/Skin: Arthritis [ ] ; Joint pain [ Y]; Muscle pain [ ] ; Joint swelling [Y ]; Back Pain [ ] ; Rash [ ]   Psych: Depression[ ] ; Anxiety[ ]    Heme: Bleeding problems [ ] ; Clotting disorders [ ] ; Anemia [Y ]  Endocrine: Diabetes [Y ]; Thyroid dysfunction[Y ]    Home Medications Prior to Admission medications   Medication Sig Start Date End Date Taking? Authorizing Provider  albuterol (PROVENTIL) (2.5 MG/3ML) 0.083% nebulizer solution INHALE 3 ML BY NEBULIZATION EVERY 6 HOURS AS NEEDED FOR WHEEZING OR SHORTNESS OF BREATH 09/10/22   Larey Dresser, MD  albuterol (VENTOLIN HFA) 108 (90 Base) MCG/ACT inhaler Inhale 2 puffs into the lungs every 6 (six) hours as needed for wheezing or shortness of breath.    [provider]  allopurinol (ZYLOPRIM) 100 MG tablet Take 1 tablet (100 mg total) by mouth daily. 12/04/21   Larey Dresser, MD  blood glucose meter kit and supplies KIT Dispense based on patient and insurance preference. Use to check CBG's three times a day. (FOR ICD-9 250.00, 250.01). 09/25/20   Barton Dubois, MD  colchicine 0.6 MG tablet TAKE 1 TABLET (0.6 MG TOTAL) BY MOUTH AS NEEDED. 12/04/21   Larey Dresser, MD  fluticasone furoate-vilanterol (BREO ELLIPTA) 100-25 MCG/ACT AEPB Inhale 1 puff into the lungs daily.    [provider]  furosemide (LASIX) 40 MG tablet TAKE 0.5 TABLETS (20 MG TOTAL) BY MOUTH DAILY AS NEEDED. 11/13/21   Larey Dresser, MD  gabapentin (NEURONTIN) 600 MG tablet Take 600 mg by mouth 2 (two) times daily.    [provider]  glipiZIDE (GLUCOTROL) 5 MG tablet Take 1 tablet (5 mg total) by mouth daily. 06/03/22 06/03/23  Clegg, Amy D, NP  guaiFENesin (MUCINEX) 600 MG 12 hr tablet Take 1 tablet (600 mg total) by mouth 2 (two) times daily. 09/11/21   Clegg, Amy D, NP  hydrALAZINE (APRESOLINE) 25 MG tablet Take 0.5 tablets (12.5 mg total) by mouth 3 (three) times daily. 09/04/22 05/02/23  Larey Dresser, MD  levofloxacin (LEVAQUIN) 750 MG tablet Take 1 tablet (750 mg total) by mouth daily. 09/10/22   Larey Dresser, MD  loratadine (CLARITIN) 10 MG tablet Take 1 tablet (10 mg total)  by mouth daily. 09/12/21   Clegg, Amy D, NP  metFORMIN (GLUCOPHAGE) 500 MG tablet Take 1 tablet (500 mg total) by mouth 2 (two) times daily with a meal. 08/07/22   Nida, Marella Chimes, MD  methimazole (TAPAZOLE) 5 MG tablet TAKE 1 TABLET BY MOUTH EVERY DAY WITH BREAKFAST 08/22/22   Cassandria Anger, MD  metoprolol succinate (TOPROL XL) 25 MG 24 hr tablet Take 1 tablet (25 mg total) by mouth daily. 01/09/22   Larey Dresser, MD  minocycline (MINOCIN) 100 MG capsule Take 2 capsules (200 mg total) by mouth 2 (two) times daily. 09/30/22   Dearborn Callas, NP  Multiple Vitamin (MULTIVITAMIN WITH MINERALS) TABS tablet Take 1 tablet by mouth daily. 12/09/19   Gold, Wayne E, PA-C  pantoprazole (PROTONIX) 40 MG tablet TAKE  1 TABLET BY MOUTH EVERY DAY 11/13/21   Larey Dresser, MD  rosuvastatin (CRESTOR) 20 MG tablet Take 20 mg by mouth daily.    [provider]  sertraline (ZOLOFT) 50 MG tablet Take 1 tablet (50 mg total) by mouth daily. 12/04/21   Larey Dresser, MD  tamsulosin (FLOMAX) 0.4 MG CAPS capsule Take 0.4 mg by mouth daily. 04/01/22   [provider]  Tetrahydrozoline HCl (REDNESS RELIEVER EYE DROPS OP) Apply 2 drops to eye daily as needed.    [provider]  traMADol (ULTRAM) 50 MG tablet Take 1 tablet (50 mg total) by mouth 2 (two) times daily as needed. 09/30/22   Larey Dresser, MD  traZODone (DESYREL) 150 MG tablet Take 1 tablet (150 mg total) by mouth at bedtime. 03/11/22   Larey Dresser, MD  warfarin (COUMADIN) 4 MG tablet On 10/5 take 4 mg then resume 6 mg (1.5 tabs) on Mon/Wed/Fri and 4 mg (1 tab) all other days or as directed by HF Clinic 09/12/21   Darrick Grinder D, NP    Past Medical History: Past Medical History:  Diagnosis Date   "    Arthritis    CAD (coronary artery disease)    a. s/p CABG in 11/2019 with LIMA-LAD, SVG-OM1, SVG-PDA and SVG-D1   CHF (congestive heart failure) (HCC)    a. EF < 20% by echo in 11/2019   Essential hypertension     PAF (paroxysmal atrial fibrillation) (HCC)    Type 2 diabetes mellitus (HCC)     Past Surgical History: Past Surgical History:  Procedure Laterality Date   APPLICATION OF WOUND VAC Left 06/14/2022   Procedure: APPLICATION OF WOUND VAC;  Surgeon: [name], MD;  Location: [place];  Service: Thoracic;  Laterality: Left;   APPLICATION OF WOUND VAC N/A 06/20/2022   Procedure: VAD POWERCORD TUNNEL WOUND VAC CHANGE;  Surgeon: [name], MD;  Location: [place];  Service: Thoracic;  Laterality: N/A;   APPLICATION OF WOUND VAC N/A 06/27/2022   Procedure: WOUND VAC CHANGE;  Surgeon: [name], MD;  Location: [place];  Service: Thoracic;  Laterality: N/A;   APPLICATION OF WOUND VAC N/A 07/08/2022   Procedure: WOUND VAC CHANGE;  Surgeon: [name], MD;  Location: [place];  Service: Thoracic;  Laterality: N/A;   BACK SURGERY     CLIPPING OF ATRIAL APPENDAGE N/A 11/26/2019   Procedure: Clipping Of Atrial Appendage using AtriCure 40 Clip;  Surgeon: Wonda Olds, MD;  Location: [place];  Service: Open Heart Surgery;  Laterality: N/A;   CORONARY ARTERY BYPASS GRAFT N/A 11/26/2019   Procedure: CORONARY ARTERY BYPASS GRAFTING (CABG) using endoscopic greater saphenous vein harvest: svc to OM; svc to Diag; svc to PD; and LIMA to LAD.;  Surgeon: Wonda Olds, MD;  Location: [place];  Service: Open Heart Surgery;  Laterality: N/A;   FOOT SURGERY     HAND SURGERY     INSERTION OF IMPLANTABLE LEFT VENTRICULAR ASSIST DEVICE N/A 11/17/2020   Procedure: INSERTION OF IMPLANTABLE LEFT VENTRICULAR ASSIST DEVICE - HM3;  Surgeon: Wonda Olds, MD;  Location: [place];  Service: Open Heart Surgery;  Laterality: N/A;   INTRAOPERATIVE TRANSESOPHAGEAL ECHOCARDIOGRAM  12/04/2019   Procedure: Intraoperative Transesophageal Echocardiogram;  Surgeon: Wonda Olds, MD;  Location: [place] [facility];  Service: Open Heart Surgery;;   MAZE N/A 11/26/2019   Procedure: MAZE using Bilateral Pulmonary Vein isolation.;   Surgeon: Wonda Olds, MD;  Location: [place];  Service: Open Heart Surgery;  Laterality: N/A;   MULTIPLE EXTRACTIONS WITH ALVEOLOPLASTY N/A 11/16/2020   Procedure: MULTIPLE EXTRACTION WITH ALVEOLOPLASTY;  Surgeon: Charlaine Colena Ketterman, DMD;  Location: [place];  Service: Dentistry;  Laterality: N/A;   PLACEMENT OF IMPELLA LEFT VENTRICULAR ASSIST DEVICE N/A 11/24/2019   Procedure: PLACEMENT OF IMPELLA 5.5 LEFT VENTRICULAR ASSIST DEVICE;  Surgeon: Wonda Olds, MD;  Location: [place];  Service: Open Heart Surgery;  Laterality: N/A;   PLACEMENT OF IMPELLA LEFT VENTRICULAR ASSIST DEVICE Left 11/10/2020   Procedure: PLACEMENT OF IMPELLA 5.5 LEFT VENTRICULAR ASSIST DEVICE VIA  LEFT AVILLARY ARTERY;  Surgeon: Wonda Olds, MD;  Location: [place];  Service: Open Heart Surgery;  Laterality: Left;   REMOVAL OF IMPELLA LEFT VENTRICULAR ASSIST DEVICE Right 12/04/2019   Procedure: REMOVAL OF IMPELLA LEFT VENTRICULAR ASSIST DEVICE, right axilla;  Surgeon: Wonda Olds, MD;  Location: [place];  Service: Open Heart Surgery;  Laterality: Right;   RIGHT/LEFT HEART CATH AND CORONARY ANGIOGRAPHY N/A 11/24/2019   Procedure: RIGHT/LEFT HEART CATH AND CORONARY ANGIOGRAPHY;  Surgeon: Larey Dresser, MD;  Location: [place] [facility];  Service: Cardiovascular;  Laterality: N/A;   RIGHT/LEFT HEART CATH AND CORONARY/GRAFT ANGIOGRAPHY N/A 11/09/2020   Procedure: RIGHT/LEFT HEART CATH AND CORONARY/GRAFT ANGIOGRAPHY;  Surgeon: Nelva Bush, MD;  Location: [place] [facility];  Service: Cardiovascular;  Laterality: N/A;   STERNAL WOUND DEBRIDEMENT N/A 05/16/2022   Procedure: VAD DRIVELINE DEBRIDEMENT;  Surgeon: [name], MD;  Location: [place];  Service: Thoracic;  Laterality: N/A;  VANC PULSE LAVAGE   STERNAL WOUND DEBRIDEMENT Left 06/14/2022   Procedure: DEBRIDEMENT OF VAD POWERCORD TUNNEL;  Surgeon: [name], MD;  Location: [place];  Service: Thoracic;  Laterality: Left;   STERNAL WOUND DEBRIDEMENT N/A 06/27/2022    Procedure: ABDOMINAL WOUND DEBRIDEMENT, APPLICATION OF MYRIAD;  Surgeon: [name], MD;  Location: [place];  Service: Thoracic;  Laterality: N/A;   STERNAL WOUND DEBRIDEMENT N/A 07/08/2022   Procedure: ABDOMINAL WOUND DEBRIDEMENT;  Surgeon: [name], MD;  Location: [place];  Service: Thoracic;  Laterality: N/A;   TEE WITHOUT CARDIOVERSION N/A 11/24/2019   Procedure: TRANSESOPHAGEAL ECHOCARDIOGRAM (TEE);  Surgeon: Wonda Olds, MD;  Location: [place];  Service: Open Heart Surgery;  Laterality: N/A;   TEE WITHOUT CARDIOVERSION N/A 11/26/2019   Procedure: TRANSESOPHAGEAL ECHOCARDIOGRAM (TEE);  Surgeon: Wonda Olds, MD;  Location: [place];  Service: Open Heart Surgery;  Laterality: N/A;   TEE WITHOUT CARDIOVERSION N/A 11/10/2020   Procedure: TRANSESOPHAGEAL ECHOCARDIOGRAM (TEE);  Surgeon: Wonda Olds, MD;  Location: [place];  Service: Open Heart Surgery;  Laterality: N/A;   TEE WITHOUT CARDIOVERSION N/A 11/17/2020   Procedure: TRANSESOPHAGEAL ECHOCARDIOGRAM (TEE);  Surgeon: Wonda Olds, MD;  Location: [place];  Service: Open Heart Surgery;  Laterality: N/A;   WOUND EXPLORATION N/A 05/31/2022   Procedure: WOUND EXPLORATION, Debridement and Irrigation of abdominal wound;  Surgeon: [name], MD;  Location: [facility];  Service: Thoracic;  Laterality: N/A;    Family History: Family History  Problem Relation Age of Onset   Heart disease Sister        Tumor?   Arthritis Other    Lung disease Other    Asthma Other    Diabetes Other    Thyroid disease Neg Hx     Social History: Social History   Socioeconomic History   Marital status: Married    Spouse name: Not on file   Number of children: Not on  file   Years of education: 12   Highest education level: Not on file  Occupational History   Not on file  Tobacco Use   Smoking status: Former    Packs/day: 1.00    Years: 30.00    Total pack years: 30.00    Types: Cigarettes    Quit date: 12/20/2019    Years since  quitting: 2.7   Smokeless tobacco: Never  Vaping Use   Vaping Use: Never used  Substance and Sexual Activity   Alcohol use: Not Currently    Comment: Prior history of excessive intake   Drug use: No   Sexual activity: Not on file  Other Topics Concern   Not on file  Social History Narrative   Not on file   Social Determinants of Health   Financial Resource Strain: Medium Risk (05/17/2022)   Overall Financial Resource Strain (CARDIA)    Difficulty of Paying Living Expenses: Somewhat hard  Food Insecurity: No Food Insecurity (09/10/2021)   Hunger Vital Sign    Worried About Running Out of Food in the Last Year: Never true    Ran Out of Food in the Last Year: Never true  Transportation Needs: Unmet Transportation Needs (09/10/2021)   PRAPARE - Hydrologist (Medical): Yes    Lack of Transportation (Non-Medical): No  Physical Activity: Inactive (11/10/2020)   Exercise Vital Sign    Days of Exercise per Week: 0 days    Minutes of Exercise per Session: 0 min  Stress: Not on file  Social Connections: Not on file    Allergies:  No Known Allergies  Objective:    Vital Signs in Clinic:  BP 128/82 (110), HR 85, T 98.7 F   Physical Exam    General:  Appears uncomfortable. Lying on stretcher in clinic. HEENT: Normal Neck: supple. JVP not elevated. Carotids 2+ bilat; no bruits.  Cor: Mechanical heart sounds with LVAD hum present. Lungs: Clear Abdomen: soft, nontender, nondistended.  Driveline: Dressing noted over driveline site; securement device intact and driveline incorporated Extremities: no cyanosis, clubbing, rash, edema, Right wrist erythematous, swollen and tender with light palpation Neuro: alert & orientedx3, cranial nerves grossly intact. moves all 4 extremities w/o difficulty. Affect pleasant   Labs    Basic Metabolic Panel: Recent Labs  Lab 09/29/22 0920 09/30/22 1513  NA 135 138  K 4.0 3.5  CL 100 103  CO2 21* 23  GLUCOSE 138*  128*  BUN 22 19  CREATININE 1.15 1.09  CALCIUM 8.9 8.8*    Liver Function Tests: Recent Labs  Lab 09/29/22 0920 09/30/22 1513  AST 44* 18  ALT 30 22  ALKPHOS 85 78  BILITOT 1.2 0.3  PROT 6.5 6.5  ALBUMIN 3.8 3.3*   No results for input(s): "LIPASE", "AMYLASE" in the last 168 hours. No results for input(s): "AMMONIA" in the last 168 hours.  CBC: Recent Labs  Lab 09/29/22 0920 09/30/22 1513  WBC 9.5 8.5  NEUTROABS 7.2  --   HGB 11.2* 10.6*  HCT 31.1* 30.3*  MCV 86.6 88.1  PLT 196 224    Cardiac Enzymes: No results for input(s): "CKTOTAL", "CKMB", "CKMBINDEX", "TROPONINI" in the last 168 hours.  BNP: BNP (last 3 results) Recent Labs    05/17/22 0153  BNP 222.1*    ProBNP (last 3 results) No results for input(s): "PROBNP" in the last 8760 hours.   CBG: No results for input(s): "GLUCAP" in the last 168 hours.  Coagulation Studies: Recent Labs  09/29/22 0920 09/30/22 1513  LABPROT 40.5* 42.6*  INR 4.3* 4.5*       Imaging    No results found.    Assessment/Plan:    Right wrist pain/swelling: -? Gout versus septic arthritis given hx of driveline infection and substance abuse. Denies any recent IV drug use.  - Check CBC, LDH, ESR, CRP, uric acid - Continue allopurinol and start colchicine. Does have hx gout in his feet. - Discussed with ortho. Obtain IR consult for joint aspiration. If positive gram stain, may require surgery. NPO after midnight. - Check blood cultures - Start empiric vanc and meropenem 2.  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  He has completed vancomycin and was on long-term doxycycline. Recently grew acinetobacter and completed course of levofloxacin. Has been back on doxycycline, most recent culture grew acinetobacter and MRSA. ID recommended minocycline.  - Hold minocylcine while on vanc and meropenem, will need to add back at  discharge. 3.  Chronic systolic CHF: Echo 63/84 with EF 20-25%, mildly decreased RV function. LHC/RHC in 12/21 with patent grafts, low output. Suspect mixed ischemic/nonischemic cardiomyopathy (prior heavy ETOH and drugs as well as CAD).  No ETOH, drugs, smoking since CABG in 12/20. Admitted with cardiogenic shock in 12/21, had placement of Impella 5.5 initially, now s/p Heartmate 3 LVAD on 11/17/20.  Ramp echo 2/23 with speed decreased to 5500 rpm. He would eventually like heart transplant. Speed now back to 5600 rpm.  MAP elevated today, hydralazine was stopped.   - Continue current Toprol XL and hydralazine 12.5 TID - MAP elevated today but also in significant pain. Monitor. - He does not need a diuretic.   - Continue warfarin, INR goal 2-2.5. INR 4.3 yesterday. INR today pending.  - Warfarin per PharmD. Will need to hold if supratherapeutic. 4. 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 D/w Renal and feel like he may need higher perfusion pressures. Creatinine has been lower recently.  - Would consider eventual reassessment of renal artery stenosis and possible treatment  - CMET today 5.  CAD: S/p CABG 12/20.  LHC pre-VAD with patent grafts, no target for intervention. - Continue statin.  6.  Atrial fibrillation: Paroxysmal.  S/p Maze and LA appendage clip with CABG in 12/20. Maintaining SR.  - Continue Toprol XL 25 mg daily.  - Off amiodarone with hyperthyroidism. 7.  Type 2 diabetes: Per PCP. On glipizide and metformin PTA.  - SSI while hospitalized 8. Methamphetamine abuse: Ongoing. Reports he bought hydrocodone off the street that was laced with methamphetamine a few days ago. 9. 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  He is now off amiodarone. Followed by endocrinology, recently decreased methimazole.  - Continue methimazole 5 mg daily.  - Had endocrinology followup.   10. Anemia (chronic blood loss): CBC  today.   11. Presyncope: Noted in parking lot on way into clinic. Did not pass out. ? 2/2 recent substance use. Monitor on tele while hospitalized.   I reviewed the LVAD parameters from today, and compared the results to the patient's prior recorded data.  No programming changes were made.  The LVAD is functioning within specified parameters.  The patient performs LVAD self-test daily.  LVAD interrogation was negative for any significant power changes, alarms or PI events/speed drops.  LVAD equipment check completed and is in good working order.  Back-up equipment present.  LVAD education done on emergency procedures and precautions and reviewed exit site care.  Length of Stay: 0  [facility], Chilton [place]ouse 09/30/2022, 5:22 PM  VAD Team Pager [phone] (7am - 7am) +++VAD ISSUES ONLY+++   Advanced Heart Failure Team Pager [phone] (M-F; 7a - 5p)  Please contact [facility] for night-coverage after hours (5p -7a ) and weekends on [email or web address] for all non- LVAD Issues   Seen with PA, agree with the above note.    He presents to LVAD clinic for sick visit.  He reports right wrist pain/swelling/redness for several days now. He self-medicated about 4 days ago with some hydrocodone he purchased that were probably laced with methamphetamine.  He went to the ER yesterday, was afebrile with normal WBCs.  He was told that he probably had gout and was sent home. WBCs for normal at 9.5.    He was seen in clinic today.  When he got out of the car, he had severe pain in his right wrist and says that he "slumped to the ground."  He did not pass out.  In the clinic, he was noted to have ongoing driveline drainage, plan had been to transition to minocycline (cultures positive for acinetobacter and MRSA). We looked at his wrist, it was red, swollen, and exquisitely tender to touch.  He denies fever, dyspnea, BRBPR/melena.    General: Well appearing this am. NAD.  HEENT: Normal. Neck: Supple, JVP 7-8 cm.  Carotids OK.  Cardiac:  Mechanical heart sounds with LVAD hum present.  Lungs:  CTAB, normal effort.  Abdomen:  NT, ND, no HSM. No bruits or masses. +BS  LVAD exit site: Dressing in place, has had drainage.  Extremities:  Warm, red, tender right wrist.  Neuro:  Alert & oriented x 3. Cranial nerves grossly intact. Moves all 4 extremities w/o difficulty. Affect pleasant     We will admit Mr Fedak for further evaluation of his right wrist.  This could be a gout flare, but with severe overlying redness and tenderness, I worry about the possibility of a septic joint, especially in setting of ongoing drug abuse though he denies injection.  He denies fever. Prior gout flares have been in his feet.  - Send CBC, blood cultures.  - Start empiric vancomycin and meropenem for now.  - Will ask orthopedics/IR to see to try to aspirate joint.  - Will continue allopurinol and start colchicine 0.6 bid for gout.  - If it ends up that joint is not infected, he will need a course of prednisone.    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    Substance abuse is an ongoing problem, we discussed this.   Loralie Champagne 09/30/22

## 2022-09-30 NOTE — Addendum Note (Signed)
Encounter addended by: Joette Catching, PA-C on: 09/30/2022 5:18 PM  Actions taken: Vitals modified

## 2022-09-30 NOTE — Telephone Encounter (Signed)
Notified by lvad team that he has regrown MRSA (R-TMP/S; S-Tetracycline <1) and acinetobacter baumanii species (S-cipro, minocycline).   Drainage did improve with levaquin and better wound care (had not been very compliant with this about 6 weeks ago with large volume drainage and new infection with acinetobacter). Tx with 2 weeks levaquin with improvement though wound has shown some signs of regression now over the last few dressing changes prompting a re-culture at recent clinic visit.   09/26/22     Superficial swab with the following results - I believe this was taken from the open wound under the driveline but will clarify with Dr. Prescott Gum.   Recent Results (from the past 240 hour(s))  Aerobic Culture w Gram Stain (superficial specimen)     Status: None   Collection Time: 09/26/22  3:02 PM   Specimen: Wound  Result Value Ref Range Status   Specimen Description WOUND  Final   Special Requests LVAD DRIVELINE  Final   Gram Stain   Final    NO WBC SEEN NO ORGANISMS SEEN Performed at [facility], [address], [place], [place] [address]    Culture   Final    FEW ACINETOBACTER CALCOACETICUS/BAUMANNII COMPLEX RARE METHICILLIN RESISTANT STAPHYLOCOCCUS AUREUS    Report Status 09/30/2022 FINAL  Final   Organism ID, Bacteria ACINETOBACTER CALCOACETICUS/BAUMANNII COMPLEX  Final   Organism ID, Bacteria METHICILLIN RESISTANT STAPHYLOCOCCUS AUREUS  Final      Susceptibility   Acinetobacter calcoaceticus/baumannii complex - MIC*    CEFTAZIDIME 16 INTERMEDIATE Intermediate     CIPROFLOXACIN 0.5 SENSITIVE Sensitive     GENTAMICIN <=1 SENSITIVE Sensitive     IMIPENEM <=0.25 SENSITIVE Sensitive     PIP/TAZO >=128 RESISTANT Resistant     TRIMETH/SULFA <=20 SENSITIVE Sensitive     AMPICILLIN/SULBACTAM <=2 SENSITIVE Sensitive     * FEW ACINETOBACTER CALCOACETICUS/BAUMANNII COMPLEX   Methicillin resistant staphylococcus aureus - MIC*    CIPROFLOXACIN >=8 RESISTANT Resistant      ERYTHROMYCIN >=8 RESISTANT Resistant     GENTAMICIN <=0.5 SENSITIVE Sensitive     OXACILLIN >=4 RESISTANT Resistant     TETRACYCLINE <=1 SENSITIVE Sensitive     VANCOMYCIN <=0.5 SENSITIVE Sensitive     TRIMETH/SULFA >=320 RESISTANT Resistant     CLINDAMYCIN <=0.25 SENSITIVE Sensitive     RIFAMPIN <=0.5 SENSITIVE Sensitive     Inducible Clindamycin NEGATIVE Sensitive     * RARE METHICILLIN RESISTANT STAPHYLOCOCCUS AUREUS    Will consolidate to minocycline 200 mg BID to cover both organisms as narrowly as possible.  Stop doxycyline. Avoid taking with milk products. Separate from iron products.   Noted in the ER with meth use again. His infection is most likely incurable but at high risk for new infections given drug use and intermittent adherence to wound care. Would prefer not to keep him on long term quinolone as we may need to reach for this later. High risk for acquired drug resistance I would suspect.    Will update Dr. Linus Salmons and see if we can get him in for follow up in 3-4 weeks for earlier follow up with him.   Janene Madeira, MSN, NP-C [facility] [email or web address] Pager: [phone] Office: [phone] [facility] Main Line: [place] Communication Welcome

## 2022-09-30 NOTE — Progress Notes (Addendum)
HF cardiologist: Dr. Aundra Dubin  Follow up for Heart Failure/LVAD:  61 y.o. 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 type 2 diabetes mellitus, COPD, and hypertension. Now s/p Heartmate 3 LVAD.   Patient had echo in 10/21 with EF 20-25%.  He was admitted in 12/21 with cardiogenic shock.  Cath showed patent grafts and low cardiac output.  Suspect mixed ischemic/nonischemic cardiomyopathy, prior heavy ETOH though no ETOH or smoking since 12/20 CABG.  He required stabilization with Impella 5.5 and ultimately had placement of Heartmate 3 LVAD.  Paroxysmal atrial fibrillation noted post-op, but generally stable course and discharged home.    Patient was admitted in 10/22 with COPD exacerbation.  Wilder Glade was stopped with frequent PIs.  He was treated with steroids, nebs, antibiotics with improvement.   Ramp echo was done in 2/23.  At 5600 rpm the LV was small and the septum with some left shift, aortic valve opening 3/5 beats.  At 5500 rpm, the LV was larger and septum more midline, aortic valve still opening 3/5 beats.  The RV was moderately enlarged with normal systolic function. I left speed at 5500 rpm.   Patient had a prolonged admission in 6/23 and again in 7/23-8/23 with MRSA driveline infection.  He went to the OR multiple times for debridement and wound vac changes.  He completed vancomycin and was placed on doxycycline.   Levofloxacin added d/t increased drainage and redness after last f/u on 09/27. Wound culture grew acinetobacter.  Wound initially improved but cultures repeated d/t decreased healing over last few dressing changes. Levofloxacin recently stopped and back on doxy.  Grew acinetobacter and MRSA on culture. ICD recommended switching to minocycline 200 BID today based on culture results.  Seen in ED yesterday with right  wrist pain. Pain felt to be d/t gout. WBC 9.5, LDH 161>332, INR 4.3.  He was felt to be stable for discharge home.   Reports he accidentally used hydrocodone laced with methamphetamine 5 days ago. Denies IV drug use.   Presented to VAD clinic for driveline dressing change today and had presyncopal episode in the parking lot resulting in a fall. Zio monitor placed in clinic. VAD parameters stable.   Noted worsening right wrist pain, warmth and swelling on arrival to VAD clinic. Wrist exquisitely tender with even light palpation. Denies fever, chills, nausea or vomiting. Denies IV drug use.     Past Medical History:  Diagnosis Date   "    Arthritis    CAD (coronary artery disease)    a. s/p CABG in 11/2019 with LIMA-LAD, SVG-OM1, SVG-PDA and SVG-D1   CHF (congestive heart failure) (HCC)    a. EF < 20% by echo in 11/2019   Essential hypertension    PAF (paroxysmal atrial fibrillation) (HCC)    Type 2 diabetes mellitus (HCC)     Current Outpatient Medications  Medication Sig Dispense Refill   albuterol (PROVENTIL) (2.5 MG/3ML) 0.083% nebulizer solution INHALE 3 ML BY NEBULIZATION EVERY 6 HOURS AS NEEDED FOR WHEEZING OR SHORTNESS OF BREATH 450 mL 1   albuterol (VENTOLIN HFA) 108 (90 Base) MCG/ACT inhaler Inhale 2 puffs into the lungs every 6 (six) hours as needed for wheezing or shortness of breath.     allopurinol (ZYLOPRIM) 100 MG tablet Take 1 tablet (100 mg total) by mouth daily. 90 tablet 3   blood glucose meter  kit and supplies KIT Dispense based on patient and insurance preference. Use to check CBG's three times a day. (FOR ICD-9 250.00, 250.01). 1 each 0   colchicine 0.6 MG tablet TAKE 1 TABLET (0.6 MG TOTAL) BY MOUTH AS NEEDED. 180 tablet 1   docusate sodium (COLACE) 100 MG capsule Take 200 mg by mouth 2 (two) times daily.     fluticasone furoate-vilanterol (BREO ELLIPTA) 100-25 MCG/ACT AEPB Inhale 1 puff into the lungs daily.     furosemide (LASIX) 40 MG tablet TAKE 0.5 TABLETS  (20 MG TOTAL) BY MOUTH DAILY AS NEEDED. 45 tablet 3   gabapentin (NEURONTIN) 600 MG tablet Take 600 mg by mouth 2 (two) times daily.     glipiZIDE (GLUCOTROL) 5 MG tablet Take 1 tablet (5 mg total) by mouth daily. 30 tablet 11   guaiFENesin (MUCINEX) 600 MG 12 hr tablet Take 1 tablet (600 mg total) by mouth 2 (two) times daily. 60 tablet 6   hydrALAZINE (APRESOLINE) 25 MG tablet Take 0.5 tablets (12.5 mg total) by mouth 3 (three) times daily. 90 tablet 3   levofloxacin (LEVAQUIN) 750 MG tablet Take 1 tablet (750 mg total) by mouth daily. 7 tablet 0   loratadine (CLARITIN) 10 MG tablet Take 1 tablet (10 mg total) by mouth daily. 30 tablet 6   metFORMIN (GLUCOPHAGE) 500 MG tablet Take 1 tablet (500 mg total) by mouth 2 (two) times daily with a meal. 60 tablet 3   methimazole (TAPAZOLE) 5 MG tablet TAKE 1 TABLET BY MOUTH EVERY DAY WITH BREAKFAST 90 tablet 1   metoprolol succinate (TOPROL XL) 25 MG 24 hr tablet Take 1 tablet (25 mg total) by mouth daily. 30 tablet 6   minocycline (MINOCIN) 100 MG capsule Take 2 capsules (200 mg total) by mouth 2 (two) times daily. 120 capsule 3   Multiple Vitamin (MULTIVITAMIN WITH MINERALS) TABS tablet Take 1 tablet by mouth daily.     pantoprazole (PROTONIX) 40 MG tablet TAKE 1 TABLET BY MOUTH EVERY DAY 90 tablet 2   rosuvastatin (CRESTOR) 20 MG tablet Take 20 mg by mouth daily.     sertraline (ZOLOFT) 50 MG tablet Take 1 tablet (50 mg total) by mouth daily. 90 tablet 3   tamsulosin (FLOMAX) 0.4 MG CAPS capsule Take 0.4 mg by mouth daily.     Tetrahydrozoline HCl (REDNESS RELIEVER EYE DROPS OP) Apply 2 drops to eye daily as needed.     traMADol (ULTRAM) 50 MG tablet Take 1 tablet (50 mg total) by mouth 2 (two) times daily as needed. 30 tablet 1   traZODone (DESYREL) 150 MG tablet Take 1 tablet (150 mg total) by mouth at bedtime. 90 tablet 3   warfarin (COUMADIN) 4 MG tablet On 10/5 take 4 mg then resume 6 mg (1.5 tabs) on Mon/Wed/Fri and 4 mg (1 tab) all other days  or as directed by HF Clinic 60 tablet 11   No current facility-administered medications for this encounter.    Patient has no known allergies.  REVIEW OF SYSTEMS: All systems negative except as listed in HPI, PMH and Problem list.   LVAD INTERROGATION:  Flow 4.0, Speed 5600, PI 4.1, Power 4.2. Numerous PI events (not unusual for him).  I reviewed the LVAD parameters from today, and compared the results to the patient's prior recorded data.  No programming changes were made.  The LVAD is functioning within specified parameters.  The patient performs LVAD self-test daily.  LVAD interrogation was negative for any significant power  changes, alarms or PI events/speed drops.  LVAD equipment check completed and is in good working order.  Back-up equipment present.   LVAD education done on emergency procedures and precautions and reviewed exit site care.    There were no vitals filed for this visit.   Physical Exam: General:  Appears uncomfortable. HEENT: normal Neck: supple. no JVD. Carotids 2+ bilat; no bruits.  Cor: PMI nondisplaced. Regular rate & rhythm. No rubs, gallops or murmurs. Lungs: clear Abdomen: soft, nontender, nondistended. Dressing over driveline dressing site Extremities: no cyanosis, clubbing, rash, edema, right wrist erythematous and swollen. Tender with palpation. Neuro: alert & orientedx3, cranial nerves grossly intact. moves all 4 extremities w/o difficulty. Affect pleasant   ASSESSMENT AND PLAN: Right wrist pain/swelling: -? Gout versus septic arthritis given hx of driveline infection and substance abuse. Denies any recent IV drug use.  - Check CBC, LDH, ESR, CRP, uric acid - Continue allopurinol. Does have hx gout  - Consult ortho to see if will need joint aspiration - Check blood cultures - Start empiric vanc and meropenem 2.  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   He has completed vancomycin and was on long-term doxycycline. Recently grew acinetobacter and completed course of levofloxacin. Has been back on doxycycline, most recent culture grew acinetobacter and MRSA. ID recommended minocycline.  - Hold PO abx for now in view of empiric IV abx 2.  Chronic systolic CHF: Echo 03/21 with EF 20-25%, mildly decreased RV function. LHC/RHC in 12/21 with patent grafts, low output. Suspect mixed ischemic/nonischemic cardiomyopathy (prior heavy ETOH and drugs as well as CAD).  No ETOH, drugs, smoking since CABG in 12/20. Admitted with cardiogenic shock in 12/21, had placement of Impella 5.5 initially, now s/p Heartmate 3 LVAD on 11/17/20.  Ramp echo 2/23 with speed decreased to 5500 rpm. He would eventually like heart transplant. Speed now back to 5600 rpm.  MAP elevated today, hydralazine was stopped.   - Continue current Toprol XL and hydralazine 12.5 TID - MAP elevated today but also in significant pain. Monitor. - He does not need a diuretic.   - Continue warfarin, INR goal 2-2.5. INR 4.3 yesterday. INR today pending.  - Warfarin per PharmD. Will need to hold if supratherapeutic. 3. 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 D/w Renal and feel like he may need higher perfusion pressures. Creatinine has been lower recently.  - Would consider eventual reassessment of renal artery stenosis and possible treatment  - CMET today 4.  CAD: S/p CABG 12/20.  LHC pre-VAD with patent grafts, no target for intervention. - Continue statin.  5.  Atrial fibrillation: Paroxysmal.  S/p Maze and LA appendage clip with CABG in 12/20. Maintaining SR.  - Continue Toprol XL 25 mg daily.  - Off amiodarone with hyperthyroidism. 6.  Type 2 diabetes: Per PCP.  7. Methamphetamine abuse: Ongoing. Reports he bought hydrocodone off the street that was laced with methamphetamine a few days ago. 8. 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  He is now off amiodarone. Followed by endocrinology, recently decreased methimazole.  - Continue methimazole 5 mg daily.  - Had endocrinology followup.   9. Anemia (chronic blood loss): CBC today.    FINCH, LINDSAY N 09/30/2022  Seen with PA, agree with the above note.   He presents to LVAD clinic for sick visit.  He reports right  wrist pain/swelling/redness for several days now. He self-medicated about 4 days ago with some hydrocodone he purchased that were probably laced with methamphetamine.  He went to the ER yesterday, was afebrile with normal WBCs.  He was told that he probably had gout and was sent home. WBCs for normal at 9.5.   He was seen in clinic today.  When he got out of the car, he had severe pain in his right wrist and says that he "slumped to the ground."  He did not pass out.  In the clinic, he was noted to have ongoing driveline drainage, plan had been to transition to minocycline (cultures positive for acinetobacter and MRSA). We looked at his wrist, it was red, swollen, and exquisitely tender to touch.  He denies fever, dyspnea, BRBPR/melena.   General: Well appearing this am. NAD.  HEENT: Normal. Neck: Supple, JVP 7-8 cm. Carotids OK.  Cardiac:  Mechanical heart sounds with LVAD hum present.  Lungs:  CTAB, normal effort.  Abdomen:  NT, ND, no HSM. No bruits or masses. +BS  LVAD exit site: Dressing in place, has had drainage.  Extremities:  Warm, red, tender right wrist.  Neuro:  Alert & oriented x 3. Cranial nerves grossly intact. Moves all 4 extremities w/o difficulty. Affect pleasant    We will admit Mr Gorka for further evaluation of his right wrist.  This could be a gout flare, but with severe overlying redness and tenderness, I worry about the possibility of a septic joint, especially in setting of ongoing drug abuse though he denies injection.  He denies fever. Prior gout flares have been in his feet.  - Send CBC, blood cultures.  - Start  empiric vancomycin and meropenem for now.  - Will ask orthopedics/IR to see to try to aspirate joint.  - Will continue allopurinol and start colchicine 0.6 bid for gout.  - If it ends up that joint is not infected, he will need a course of prednisone.   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   Substance abuse is an ongoing problem, we discussed this.   Loralie Champagne 09/30/2022 5:04 PM

## 2022-09-30 NOTE — Progress Notes (Signed)
ANTICOAGULATION CONSULT NOTE - Initial Consult  Pharmacy Consult for Warfarin Indication: VAD  No Known Allergies  Patient Measurements:    Vital Signs: Temp: 98.7 F (37.1 C) (10/23 1717) BP: 128/82 (10/23 1717) Pulse Rate: 85 (10/23 1717)  Labs: Recent Labs    09/29/22 0920 09/29/22 1126 09/30/22 1513  HGB 11.2*  --  10.6*  HCT 31.1*  --  30.3*  PLT 196  --  224  LABPROT 40.5*  --  42.6*  INR 4.3*  --  4.5*  CREATININE 1.15  --  1.09  TROPONINIHS 7 8  --     Estimated Creatinine Clearance: 73.5 mL/min (by C-G formula based on SCr of 1.09 mg/dL).   Medical History: Past Medical History:  Diagnosis Date   "    Arthritis    CAD (coronary artery disease)    a. s/p CABG in 11/2019 with LIMA-LAD, SVG-OM1, SVG-PDA and SVG-D1   CHF (congestive heart failure) (HCC)    a. EF < 20% by echo in 11/2019   Essential hypertension    PAF (paroxysmal atrial fibrillation) (HCC)    Type 2 diabetes mellitus (HCC)     Medications:  Scheduled:   feeding supplement  237 mL Oral BID BM   [START ON 10/01/2022] Warfarin - Pharmacist Dosing Inpatient   Does not apply q1600   zinc sulfate  220 mg Oral Daily   Infusions:   meropenem (MERREM) IV     [START ON 10/01/2022] vancomycin     vancomycin      Assessment: Kevin Underwood admitted for VAD driveline infection on warfarin prior to admission.   Med history not complete for admission; however, recent notes have PTA regimen as 6mg  MWF and 4mg  AOD  INR today 4.5 which is above goal. No signs of bleeding noted.   Goal of Therapy:  INR 2-2.5 Monitor platelets by anticoagulation protocol: Yes   Plan:  Hold warfarin tonight given elevated INR Daily INR Resume warfarin once INR <2.5  Merrilee Jansky, PharmD Clinical Pharmacist 09/30/2022,6:30 PM

## 2022-10-01 ENCOUNTER — Inpatient Hospital Stay (HOSPITAL_COMMUNITY): Payer: 59

## 2022-10-01 DIAGNOSIS — T827XXA Infection and inflammatory reaction due to other cardiac and vascular devices, implants and grafts, initial encounter: Secondary | ICD-10-CM | POA: Diagnosis not present

## 2022-10-01 DIAGNOSIS — M009 Pyogenic arthritis, unspecified: Secondary | ICD-10-CM | POA: Diagnosis not present

## 2022-10-01 DIAGNOSIS — Z95811 Presence of heart assist device: Secondary | ICD-10-CM

## 2022-10-01 LAB — BASIC METABOLIC PANEL
Anion gap: 10 (ref 5–15)
Anion gap: 11 (ref 5–15)
BUN: 17 mg/dL (ref 8–23)
BUN: 20 mg/dL (ref 8–23)
CO2: 23 mmol/L (ref 22–32)
CO2: 25 mmol/L (ref 22–32)
Calcium: 8.7 mg/dL — ABNORMAL LOW (ref 8.9–10.3)
Calcium: 8.9 mg/dL (ref 8.9–10.3)
Chloride: 102 mmol/L (ref 98–111)
Chloride: 101 mmol/L (ref 98–111)
Creatinine, Ser: 1.12 mg/dL (ref 0.61–1.24)
Creatinine, Ser: 1.17 mg/dL (ref 0.61–1.24)
GFR, Estimated: >60 mL/min (ref >60)
GFR, Estimated: >60 mL/min (ref >60)
Glucose, Bld: 131 mg/dL — ABNORMAL HIGH (ref 70–99)
Glucose, Bld: 151 mg/dL — ABNORMAL HIGH (ref 70–99)
Potassium: 3.5 mmol/L (ref 3.5–5.1)
Potassium: 4 mmol/L (ref 3.5–5.1)
Sodium: 135 mmol/L (ref 135–145)
Sodium: 137 mmol/L (ref 135–145)

## 2022-10-01 LAB — PROTIME-INR
INR: 3.5 — ABNORMAL HIGH (ref 0.8–1.2)
Prothrombin Time: 34.7 seconds — ABNORMAL HIGH (ref 11.4–15.2)

## 2022-10-01 LAB — CBC
HCT: 29.7 % — ABNORMAL LOW (ref 39.0–52.0)
Hemoglobin: 10.5 g/dL — ABNORMAL LOW (ref 13.0–17.0)
MCH: 31 pg (ref 26.0–34.0)
MCHC: 35.4 g/dL (ref 30.0–36.0)
MCV: 87.6 fL (ref 80.0–100.0)
Platelets: 205 10*3/uL (ref 150–400)
RBC: 3.39 MIL/uL — ABNORMAL LOW (ref 4.22–5.81)
RDW: 14.3 % (ref 11.5–15.5)
WBC: 6.8 10*3/uL (ref 4.0–10.5)
nRBC: 0 % (ref 0.0–0.2)

## 2022-10-01 LAB — RAPID URINE DRUG SCREEN, HOSP PERFORMED
Amphetamines: POSITIVE — AB
Barbiturates: NOT DETECTED
Benzodiazepines: NOT DETECTED
Cocaine: NOT DETECTED
Opiates: POSITIVE — AB
Tetrahydrocannabinol: NOT DETECTED

## 2022-10-01 LAB — GLUCOSE, CAPILLARY
Glucose-Capillary: 117 mg/dL — ABNORMAL HIGH (ref 70–99)
Glucose-Capillary: 136 mg/dL — ABNORMAL HIGH (ref 70–99)
Glucose-Capillary: 138 mg/dL — ABNORMAL HIGH (ref 70–99)

## 2022-10-01 LAB — MAGNESIUM: Magnesium: 1.4 mg/dL — ABNORMAL LOW (ref 1.7–2.4)

## 2022-10-01 LAB — LACTATE DEHYDROGENASE: LDH: 132 U/L (ref 98–192)

## 2022-10-01 MED ORDER — HYDROMORPHONE HCL 1 MG/ML IJ SOLN
1.0000 mg | INTRAMUSCULAR | Status: DC | PRN
  Administered 2022-10-02 (×2): 1 mg via INTRAVENOUS
  Filled 2022-10-01 (×2): qty 1

## 2022-10-01 MED ORDER — SODIUM CHLORIDE 0.9 % IV SOLN
3.0000 g | Freq: Four times a day (QID) | INTRAVENOUS | Status: DC
  Administered 2022-10-01 – 2022-10-03 (×8): 3 g via INTRAVENOUS
  Filled 2022-10-01 (×8): qty 8

## 2022-10-01 MED ORDER — MORPHINE SULFATE (PF) 2 MG/ML IV SOLN
1.0000 mg | Freq: Four times a day (QID) | INTRAVENOUS | Status: DC | PRN
  Administered 2022-10-01: 2 mg via INTRAVENOUS
  Filled 2022-10-01: qty 1

## 2022-10-01 MED ORDER — MAGNESIUM SULFATE 4 GM/100ML IV SOLN
4.0000 g | Freq: Once | INTRAVENOUS | Status: AC
  Administered 2022-10-01: 4 g via INTRAVENOUS
  Filled 2022-10-01: qty 100

## 2022-10-01 MED ORDER — IOHEXOL 350 MG/ML SOLN
50.0000 mL | Freq: Once | INTRAVENOUS | Status: AC | PRN
  Administered 2022-10-01: 50 mL via INTRAVENOUS

## 2022-10-01 NOTE — Progress Notes (Signed)
ANTICOAGULATION CONSULT NOTE -Follow Up Consult  Pharmacy Consult for Warfarin Indication: VAD  No Known Allergies  Patient Measurements: Height: 5\' 10"  (177.8 cm) Weight: 81.1 kg (178 lb 12.7 oz) IBW/kg (Calculated) : 73  Vital Signs: Temp: 97.5 F (36.4 C) (10/24 0400) Temp Source: Oral (10/24 0400) BP: 115/93 (10/24 0600) Pulse Rate: 79 (10/24 0600)  Labs: Recent Labs    09/29/22 0920 09/29/22 1126 09/30/22 1513 10/01/22 0532  HGB 11.2*  --  10.6* 10.5*  HCT 31.1*  --  30.3* 29.7*  PLT 196  --  224 205  LABPROT 40.5*  --  42.6* 34.7*  INR 4.3*  --  4.5* 3.5*  CREATININE 1.15  --  1.09 1.12  TROPONINIHS 7 8  --   --      Estimated Creatinine Clearance: 71.5 mL/min (by C-G formula based on SCr of 1.12 mg/dL).   Medical History: Past Medical History:  Diagnosis Date   "    Arthritis    CAD (coronary artery disease)    a. s/p CABG in 11/2019 with LIMA-LAD, SVG-OM1, SVG-PDA and SVG-D1   CHF (congestive heart failure) (HCC)    a. EF < 20% by echo in 11/2019   Essential hypertension    PAF (paroxysmal atrial fibrillation) (HCC)    Type 2 diabetes mellitus (HCC)     Medications:  Scheduled:   allopurinol  100 mg Oral Daily   Chlorhexidine Gluconate Cloth  6 each Topical Daily   colchicine  0.6 mg Oral Daily   feeding supplement  237 mL Oral BID BM   gabapentin  600 mg Oral BID   hydrALAZINE  12.5 mg Oral TID   insulin aspart  0-15 Units Subcutaneous TID WC   methimazole  5 mg Oral Q breakfast   metoprolol succinate  25 mg Oral Daily   mupirocin ointment  1 Application Nasal BID   pantoprazole  40 mg Oral Daily   rosuvastatin  20 mg Oral Daily   sertraline  50 mg Oral Daily   tamsulosin  0.4 mg Oral Daily   Warfarin - Pharmacist Dosing Inpatient   Does not apply q1600   zinc sulfate  220 mg Oral Daily   Infusions:   meropenem (MERREM) IV 280 mL/hr at 10/01/22 0700   vancomycin      Assessment: 37 YOM admitted for VAD driveline infection on  warfarin prior to admission.   PTA regimen warfarin  6mg  MWF and 4mg  AOD  INR on admit 4.5> down 3.5 after dose held 10/23 but remains  above goal. No signs of bleeding noted. CBC stable LDH stable 130s  Goal of Therapy:  INR 2-2.5 Monitor platelets by anticoagulation protocol: Yes   Plan:  Hold warfarin again tonight  Daily INR Resume warfarin once INR <2.5  Bonnita Nasuti Pharm.D. CPP, BCPS Clinical Pharmacist [phone] 10/01/2022 8:10 AM

## 2022-10-01 NOTE — Progress Notes (Addendum)
Advanced Heart Failure VAD Team Note  PCP-Cardiologist: Kevin Lesches, MD   Subjective:   Admitted r wrist pain/swelling. Started on IV antibiotics.   Bld CX- NGTD  Complaining right wrist pain.   LVAD INTERROGATION:  HeartMate III  LVAD:   Flow liters/min, speed 5600, power 4.2 , PI 7.3.  Many PI events.   Objective:    Vital Signs:   Temp:  [97.5 F (36.4 C)-98 F (36.7 C)] 97.5 F (36.4 C) (10/24 0400) Pulse Rate:  [79-138] 79 (10/24 0600) Resp:  [11-20] 11 (10/24 0600) BP: (96-122)/(63-98) 115/93 (10/24 0600) SpO2:  [88 %-100 %] 100 % (10/24 0600) Weight:  [81.1 kg] 81.1 kg (10/23 2100) Last BM Date : 09/29/22 Mean arterial Pressure  80s  Intake/Output:   Intake/Output Summary (Last 24 hours) at 10/01/2022 0755 Last data filed at 10/01/2022 0700 Gross per 24 hour  Intake 807.27 ml  Output --  Net 807.27 ml     Physical Exam    General:. No resp difficulty HEENT: normal Neck: supple. JVP 5-6  Carotids 2+ bilat; no bruits. No lymphadenopathy or thyromegaly appreciated. Cor: Mechanical heart sounds with LVAD hum present. Lungs: clear Abdomen: soft, nontender, nondistended. No hepatosplenomegaly. No bruits or masses. Good bowel sounds. Driveline: Dressing dry over driveline.  Extremities: no cyanosis, clubbing, rash, edema. R hand/rist pain. Neuro: alert & orientedx3, cranial nerves grossly intact. moves all 4 extremities w/o difficulty. Affect pleasant   Telemetry   SR 90s   EKG    N/A  Labs   Basic Metabolic Panel: Recent Labs  Lab 09/29/22 0920 09/30/22 1513 10/01/22 0532  NA 135 138 135  K 4.0 3.5 3.5  CL 100 103 102  CO2 21* 23 23  GLUCOSE 138* 128* 131*  BUN _0 CREATININE 1.15 1.09 1.12  CALCIUM 8.9 8.8* 8.7*    Liver Function Tests: Recent Labs  Lab 09/29/22 0920 09/30/22 1513  AST 44* 18  ALT 30 22  ALKPHOS 85 78  BILITOT 1.2 0.3  PROT 6.5 6.5  ALBUMIN 3.8 3.3*   No results for input(s): "LIPASE", "AMYLASE"  in the last 168 hours. No results for input(s): "AMMONIA" in the last 168 hours.  CBC: Recent Labs  Lab 09/29/22 0920 09/30/22 1513 10/01/22 0532  WBC 9.5 8.5 6.8  NEUTROABS 7.2  --   --   HGB 11.2* 10.6* 10.5*  HCT 31.1* 30.3* 29.7*  MCV 86.6 88.1 87.6  PLT 196 224 205    INR: Recent Labs  Lab 09/25/22 0000 09/29/22 0920 09/30/22 1513 10/01/22 0532  INR 2.2 4.3* 4.5* 3.5*    Other results: EKG:    Imaging   [place] RT UPPER EXTREM LTD SOFT TISSUE NON VASCULAR  Result Date: 09/30/2022 CLINICAL DATA:  Right wrist pain EXAM: ULTRASOUND RIGHT UPPER EXTREMITY LIMITED TECHNIQUE: Ultrasound examination of the upper extremity soft tissues was performed in the area of clinical concern. COMPARISON:  None Available. FINDINGS: There appears to be mild tenosynovitis in the region of concern for example on image 7 of series 1 -1. IMPRESSION: 1. Suspected tenosynovitis in the region of concern. MRI could be utilized for confirmation. Electronically Signed   By: Van Clines M.D.   On: 09/30/2022 19:29   DG Chest Port 1 View  Result Date: 09/29/2022 CLINICAL DATA:  61 year old male with pain.  LVAD. EXAM: PORTABLE CHEST 1 VIEW COMPARISON:  Chest radiographs 08/05/2022 and earlier. FINDINGS: Portable AP semi upright view at 0903 hours. Visible LVAD and other mediastinal  postoperative changes appears stable since August. Mediastinal contours remain within normal limits. Stable lung volumes. Allowing for portable technique the lungs are clear. Visualized tracheal air column is within normal limits. Bilateral axillary surgical clips. Chronic left clavicle fracture. No acute osseous abnormality identified. IMPRESSION: Stable postoperative appearance of the chest. No acute cardiopulmonary abnormality. Electronically Signed   By: Genevie Ann M.D.   On: 09/29/2022 09:15     Medications:     Scheduled Medications:  allopurinol  100 mg Oral Daily   Chlorhexidine Gluconate Cloth  6 each Topical  Daily   colchicine  0.6 mg Oral Daily   feeding supplement  237 mL Oral BID BM   gabapentin  600 mg Oral BID   hydrALAZINE  12.5 mg Oral TID   insulin aspart  0-15 Units Subcutaneous TID WC   methimazole  5 mg Oral Q breakfast   metoprolol succinate  25 mg Oral Daily   mupirocin ointment  1 Application Nasal BID   pantoprazole  40 mg Oral Daily   rosuvastatin  20 mg Oral Daily   sertraline  50 mg Oral Daily   tamsulosin  0.4 mg Oral Daily   Warfarin - Pharmacist Dosing Inpatient   Does not apply q1600   zinc sulfate  220 mg Oral Daily    Infusions:  meropenem (MERREM) IV 280 mL/hr at 10/01/22 0700   vancomycin      PRN Medications: acetaminophen, ondansetron (ZOFRAN) IV, oxyCODONE, traMADol   Patient Profile     Assessment/Plan:   Right wrist pain/swelling: -? Gout versus septic arthritis given hx of driveline infection and substance abuse. Denies any recent IV drug use.  Uric acid 2.9 CRP 5 Sed rate 28  - Continue allopurinol and started colchicine. Does have hx gout in his feet. - Discussed with ortho.  - IR consulted for joint aspiration. If positive gram stain, may require surgery.  - NPO and will see if IR wants to pursue today with INR 3.5, trending down.  - Bld Cx- NGTD - Continue vanc and meropenem 2.  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  He has completed vancomycin and was on long-term doxycycline. Recently grew acinetobacter and completed course of levofloxacin. Has been back on doxycycline, most recent culture grew acinetobacter and MRSA. ID recommended minocycline.  - Hold minocycline while on vanc and meropenem, will need to add back at discharge. 3.  Chronic systolic CHF: Echo 64/40 with EF 20-25%, mildly decreased RV function. LHC/RHC in 12/21 with patent grafts, low output. Suspect mixed ischemic/nonischemic cardiomyopathy (prior heavy ETOH and drugs as well as  CAD).  No ETOH, drugs, smoking since CABG in 12/20. Admitted with cardiogenic shock in 12/21, had placement of Impella 5.5 initially, now s/p Heartmate 3 LVAD on 11/17/20.  Ramp echo 2/23 with speed decreased to 5500 rpm. He would eventually like heart transplant. Speed now back to 5600 rpm.  - Maps stable.  Volume status stable.  - Continue current Toprol XL and hydralazine 12.5 TID - MAP elevated today but also in significant pain. Monitor. - Continue warfarin, INR goal 2-2.5. INR 3.5 today.  - hold coumadin. . 4. 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 D/w Renal and feel like he may need higher perfusion pressures. Creatinine has been lower recently.  - Would consider eventual reassessment of renal artery stenosis and possible treatment  -  Renl functio stable.  5.  CAD: S/p CABG 12/20.  LHC pre-VAD with patent grafts, no target for intervention. - Continue statin.  6.  Atrial fibrillation: Paroxysmal.  S/p Maze and LA appendage clip with CABG in 12/20. - in SR.  - Continue Toprol XL 25 mg daily.  - Off amiodarone with hyperthyroidism. 7.  Type 2 diabetes: Per PCP. On glipizide and metformin PTA.  - SSI while hospitalized 8. Methamphetamine abuse: Ongoing. Reports he bought hydrocodone off the street that was laced with methamphetamine a few days ago. - UDS pending.  9. 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  He is now off amiodarone. Followed by endocrinology, recently decreased methimazole.  - Continue methimazole 5 mg daily.  - Had endocrinology followup.   10. Anemia (chronic blood loss): Hgb stable 10.5  11. Presyncope: Noted in parking lot on way into clinic. Did not pass out. ? 2/2 recent substance use. Monitor on tele while hospitalized. ? Resolved.   Transfer to progressive care.    I reviewed the LVAD parameters from today, and compared the results to the patient's prior recorded data.  No  programming changes were made.  The LVAD is functioning within specified parameters.  The patient performs LVAD self-test daily.  LVAD interrogation was negative for any significant power changes, alarms or PI events/speed drops.  LVAD equipment check completed and is in good working order.  Back-up equipment present.   LVAD education done on emergency procedures and precautions and reviewed exit site care.  Length of Stay: 1  Kevin Grinder, Kevin Underwood 10/01/2022, 7:55 AM  VAD Team --- VAD ISSUES ONLY--- Pager [phone] (7am - 7am)  Advanced Heart Failure Team  Pager [phone] (M-F; 7a - 5p)  Please contact [facility] for night-coverage after hours (5p -7a ) and weekends on [email or web address]  Patient seen with Kevin Underwood, agree with the above note.   Right wrist remains markedly painful and tender.  It is less red this morning.    Uric acid is not elevated, ESR mildly elevated at 28, afebrile with normal WBCs.   General: Well appearing this am. NAD.  HEENT: Normal. Neck: Supple, JVP 7-8 cm. Carotids OK.  Cardiac:  Mechanical heart sounds with LVAD hum present.  Lungs:  CTAB, normal effort.  Abdomen:  NT, ND, no HSM. No bruits or masses. +BS  LVAD exit site: Well-healed and incorporated. Dressing dry and intact. No erythema or drainage. Stabilization device present and accurately applied. Driveline dressing changed daily per sterile technique. Extremities:  No edema.  Warm right wrist with overlying erythema.  Neuro:  Alert & oriented x 3. Cranial nerves grossly intact. Moves all 4 extremities w/o difficulty. Affect pleasant    We admitted Mr Loera for further evaluation of his right wrist.  This could be a gout flare, but with severe overlying redness and tenderness initially in addition to recent driveline infection and substance abuse history, I worry about the possibility of a septic joint.  Prior gout flares have been in his feet and uric acid is not elevated this admission. Afebrile, WBCs normal. Wrist  looks less red today.  - Cultures NGTD.  - Continue empiric vancomycin and meropenem for now.  - IR to aspirate joint.  INR 3.5 today, warfarin on hold.  Will await IR guidance on timing of aspiration.  - Continue home allopurinol as well as colchicine for ?gout flare.  - If it ends up that joint is not infected, he will need a course of prednisone.  -  Plain films right wrist.    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    Substance abuse is an ongoing problem, we discussed this.   Loralie Champagne 10/01/2022 8:21 AM

## 2022-10-01 NOTE — Progress Notes (Signed)
Orthopedic Tech Progress Note Patient Details:  Ramond Darnell 02/01/1961 [id number]  Ortho Devices Type of Ortho Device: Velcro wrist splint Ortho Device/Splint Location: RUE Ortho Device/Splint Interventions: Ordered, Application, Adjustment   Post Interventions Patient Tolerated: Well Instructions Provided: Care of device  Janit Pagan 10/01/2022, 3:13 PM

## 2022-10-01 NOTE — Consult Note (Signed)
[facility] for Infectious Disease    Date of Admission:  09/30/2022     Total days of antibiotics 2               Reason for Consult: Wrist infection / Gout   Referring Provider: Dr. 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 Antibiotics have been changed to ampicillin/sulbactam and vancomycin which will cover MRSA and Acinetobacter while in the hospital. Will need therapeutic monitoring of renal function and vancomycin levels. Blood cultures are without growth to date. Awaiting CT of the right wrist for possible aspiration (unable to do MRI secondary to LVAD) while in the interim  for gout flare with colchicine and allopurinol and vancomycin and ampicillin-sulbactam for potential septic joint. Will continue to monitor cultures and await aspiration. LVAD management per LVAD team.   PLAN:  Continue ampicillin-sulbactam and vancomycin.  Monitor blood cultures for bacteremia. Therapeutic drug monitoring of renal function and vancomycin levels.  Drive line care per LVAD team.  Await CT with contrast of wrist and potential aspiration to rule out septic joint.  Remaining medical and supportive care per primary team.    Principal Problem:   Septic joint of left wrist (HCC)    allopurinol  100 mg Oral Daily   Chlorhexidine Gluconate Cloth  6 each Topical Daily   colchicine  0.6 mg Oral Daily   gabapentin  600 mg Oral BID   hydrALAZINE  12.5 mg Oral TID   insulin aspart  0-15 Units Subcutaneous TID WC   methimazole  5 mg Oral Q breakfast   metoprolol succinate  25 mg Oral Daily   mupirocin ointment  1 Application Nasal BID   pantoprazole  40 mg Oral Daily    rosuvastatin  20 mg Oral Daily   sertraline  50 mg Oral Daily   tamsulosin  0.4 mg Oral Daily   Warfarin - Pharmacist Dosing Inpatient   Does not apply q1600   zinc sulfate  220 mg Oral Daily     HPI: Kevin Grau. is a 61 y.o. male with previous medical history of systolic heart failure s/p LVAD placement, hypertension and paroxsymal atrial fibrillation admitted to the hospital via the LVAD clinic with redness and swelling of the right wrist with concern for septic arthritis vs gout.   Kevin Underwood is known to the ID service last seen on 07/24/22 by Dr. Linus Salmons with chronic drive line infection s/p debridement growing MRSA in early June 2023 and which was treated with IV vancomycin and daptomycin (switched secondary to nausea) got 4 weeks however course was complicated by needing multiple debridements and was continued on vancomycin through 8/10 when he also received a dose of Dalavance. Started on doxycycline for suppression following that. Then on 09/04/22 had increased redness, drainage, and pain at the exit site. New cultures obtained were growing Acinetobacter Calcloaetiscus/Baumannii Complex and started on levofloxacin. Seen in follow up by the LVAD team on 10/2 and continued on doxycycline and levofloxacin. Repeat cultures obtained on 10/19 grew both MRSA and Acinetobacter. Had been taking doxycycline and stopped the levofloxacin about week prior.   Kevin Underwood was seen in the ED on 10/22 with acute  onset right wrist pain that progressively moved up his arm. Believed to be consistent with gout and treated with tramadol and morphine. Presented to drive line dressing change on 10/23 and had pre-syncopal episode in the parking deck prior to arrival in the clinic with lightheadedness and dizziness arriving to the LVAD clinic with paleness and diaphoretic. Exit site noted to be healing. Admitted to the hospital with concern for gout vs. septic joint.   Soft tissue ultrasound with suspected  tenosynovitis with recommendation for MRI. Right wrist x-ray with degenerative changes and no fracture/dislocation. Afebrile since admission and was initially started on vancomycin and meropenem which has now been changed to ampicillin-sulbactam and vancomycin. Also on allopurinol and colchicine. Blood cultures have been without growth to date. Minocycline held while on broad spectrum antibiotics. IR consulted for wrist aspiration and requesting MRI of the right wrist. CRP 5 and ESR 28. Of note toxicology was positive for methamphetamines and opiates and LVAD team with concern for history of substance use.     Review of Systems: Review of Systems  Constitutional:  Negative for chills, fever and weight loss.  Respiratory:  Negative for cough, shortness of breath and wheezing.   Cardiovascular:  Negative for chest pain and leg swelling.  Gastrointestinal:  Negative for abdominal pain, constipation, diarrhea, nausea and vomiting.  Musculoskeletal:        Right wrist pain.   Skin:  Negative for rash.     Past Medical History:  Diagnosis Date   "    Arthritis    CAD (coronary artery disease)    a. s/p CABG in 11/2019 with LIMA-LAD, SVG-OM1, SVG-PDA and SVG-D1   CHF (congestive heart failure) (HCC)    a. EF < 20% by echo in 11/2019   Essential hypertension    PAF (paroxysmal atrial fibrillation) (HCC)    Type 2 diabetes mellitus ([place])     Social History   Tobacco Use   Smoking status: Former    Packs/day: 1.00    Years: 30.00    Total pack years: 30.00    Types: Cigarettes    Quit date: 12/20/2019    Years since quitting: 2.7   Smokeless tobacco: Never  Vaping Use   Vaping Use: Never used  Substance Use Topics   Alcohol use: Not Currently    Comment: Prior history of excessive intake   Drug use: No    Family History  Problem Relation Age of Onset   Heart disease Sister        Tumor?   Arthritis Other    Lung disease Other    Asthma Other    Diabetes Other    Thyroid  disease Neg Hx     No Known Allergies  OBJECTIVE: Blood pressure (!) 121/95, pulse 79, temperature (!) 97.5 F (36.4 C), temperature source Oral, resp. rate 11, height $RemoveBe'5\' 10"'XyxJWwjjF$  (1.778 m), weight 81.1 kg, SpO2 100 %.  Physical Exam Constitutional:      General: He is not in acute distress.    Appearance: He is well-developed.  Cardiovascular:     Rate and Rhythm: Normal rate and regular rhythm.     Comments: LVAD hum.  Pulmonary:     Effort: Pulmonary effort is normal.     Breath sounds: Normal breath sounds.  Skin:    General: Skin is warm and dry.  Neurological:     Mental Status: He is alert and oriented to person, place, and time.     Lab Results Lab Results  Component Value Date   WBC 6.8 10/01/2022   HGB 10.5 (L) 10/01/2022   HCT 29.7 (L) 10/01/2022   MCV 87.6 10/01/2022   PLT 205 10/01/2022    Lab Results  Component Value Date   CREATININE 1.12 10/01/2022   BUN 17 10/01/2022   NA 135 10/01/2022   K 3.5 10/01/2022   CL 102 10/01/2022   CO2 23 10/01/2022    Lab Results  Component Value Date   ALT 22 09/30/2022   AST 18 09/30/2022   ALKPHOS 78 09/30/2022   BILITOT 0.3 09/30/2022     Microbiology: Recent Results (from the past 240 hour(s))  Aerobic Culture w Gram Stain (superficial specimen)     Status: None   Collection Time: 09/26/22  3:02 PM   Specimen: Wound  Result Value Ref Range Status   Specimen Description WOUND  Final   Special Requests LVAD DRIVELINE  Final   Gram Stain   Final    NO WBC SEEN NO ORGANISMS SEEN Performed at [facility], [address], [place], [place] [address]    Culture   Final    FEW ACINETOBACTER CALCOACETICUS/BAUMANNII COMPLEX RARE METHICILLIN RESISTANT STAPHYLOCOCCUS AUREUS    Report Status 09/30/2022 FINAL  Final   Organism ID, Bacteria ACINETOBACTER CALCOACETICUS/BAUMANNII COMPLEX  Final   Organism ID, Bacteria METHICILLIN RESISTANT STAPHYLOCOCCUS AUREUS  Final      Susceptibility   Acinetobacter  calcoaceticus/baumannii complex - MIC*    CEFTAZIDIME 16 INTERMEDIATE Intermediate     CIPROFLOXACIN 0.5 SENSITIVE Sensitive     GENTAMICIN <=1 SENSITIVE Sensitive     IMIPENEM <=0.25 SENSITIVE Sensitive     PIP/TAZO >=128 RESISTANT Resistant     TRIMETH/SULFA <=20 SENSITIVE Sensitive     AMPICILLIN/SULBACTAM <=2 SENSITIVE Sensitive     * FEW ACINETOBACTER CALCOACETICUS/BAUMANNII COMPLEX   Methicillin resistant staphylococcus aureus - MIC*    CIPROFLOXACIN >=8 RESISTANT Resistant     ERYTHROMYCIN >=8 RESISTANT Resistant     GENTAMICIN <=0.5 SENSITIVE Sensitive     OXACILLIN >=4 RESISTANT Resistant     TETRACYCLINE <=1 SENSITIVE Sensitive     VANCOMYCIN <=0.5 SENSITIVE Sensitive     TRIMETH/SULFA >=320 RESISTANT Resistant     CLINDAMYCIN <=0.25 SENSITIVE Sensitive     RIFAMPIN <=0.5 SENSITIVE Sensitive     Inducible Clindamycin NEGATIVE Sensitive     * RARE METHICILLIN RESISTANT STAPHYLOCOCCUS AUREUS  Culture, blood (Routine X 2) w Reflex to ID Panel     Status: None (Preliminary result)   Collection Time: 09/30/22  6:46 PM   Specimen: BLOOD LEFT ARM  Result Value Ref Range Status   Specimen Description BLOOD LEFT ARM  Final   Special Requests   Final    BOTTLES DRAWN AEROBIC AND ANAEROBIC Blood Culture adequate volume   Culture   Final    NO GROWTH < 24 HOURS Performed at [facility], [address], [place], [place] [address]    Report Status PENDING  Incomplete  Culture, blood (Routine X 2) w Reflex to ID Panel     Status: None (Preliminary result)   Collection Time: 09/30/22  6:47 PM   Specimen: BLOOD LEFT ARM  Result Value Ref Range Status   Specimen Description BLOOD LEFT ARM  Final   Special Requests   Final    BOTTLES DRAWN AEROBIC AND ANAEROBIC Blood Culture adequate volume   Culture   Final    NO GROWTH < 24 HOURS Performed at [facility], [address] [place], [place] [address]    Report Status PENDING  Incomplete  Surgical PCR screen      Status: None   Collection Time: 09/30/22  8:56 PM   Specimen: Nasal Mucosa; Nasal Swab  Result Value Ref Range Status   MRSA, PCR NEGATIVE NEGATIVE Final   Staphylococcus aureus NEGATIVE NEGATIVE Final    Comment: (NOTE) The Xpert SA Assay (FDA approved for NASAL specimens in patients 48 years of age and older), is one component of a comprehensive surveillance program. It is not intended to diagnose infection nor to guide or monitor treatment. Performed at [facility], [place] [address], [place], [place] [address]      Terri Piedra, [place]  10/01/2022  11:15 AM

## 2022-10-01 NOTE — Progress Notes (Signed)
   Spoke Hilbert Odor PA-C regarding right wrist.   Admitted with right wrist pain and cellulitis. Unable to obtain MRI, has VAD.   Recommended  Removal wrist splint and CT right wrist with contract. If effusion noted will follow up with him otherwise additional recommendations. Continue IV antibiotics.  Updated Dr Aundra Dubin.   Deedra Pro NP- C  2:12 PM

## 2022-10-01 NOTE — Progress Notes (Signed)
Driveline Exit Care:  Existing VAD dressing removed and site care performed using sterile technique. Drive line exit site cleaned with Chlora prep applicators x 2 and hypochlorous, allowed to dry, and gauze dressing with hypochlorous 2 x 2 applied. Exit site healing and incorporated. Scant yellow drainage. No redness, tenderness, foul odor or rash noted. Drive line anchor re-applied. Will advance to every other day dressing changes. Next dressing change due 10/03/22 using daily kit by VAD coordinator.      [name] RN, BSN VAD Coordinator 24/7 Pager [phone]

## 2022-10-01 NOTE — Progress Notes (Signed)
Radiology team received request for wrist aspiration. [place] wrist does not provide enough information to determine if wrist aspiration is feasible. MR wrist recommended. Ordering providers made aware. No procedure planned at this time.   Soyla Dryer, [place] [phone] 10/01/2022, 8:50 AM

## 2022-10-01 NOTE — Progress Notes (Signed)
LVAD Coordinator Rounding Note:  Admitted 09/30/22 to Dr. Claris Gladden service due to right wrist swelling and pain. Started on IV antibiotics.   Pt lying in bed on my arrival. States right wrist pain "is pretty bad." Redness has improved some overnight. IR/ortho consulted for treatment plan.   On empiric Vancomycin and Meropenem.   Right wrist xray: No fracture or dislocation is seen. Degenerative changes are noted in multiple joints as described in the body of the report. Fairly extensive arterial calcifications are seen suggesting significant arteriosclerosis.  May transfer to [facility] per Dr Aundra Dubin.   Vital signs: Temp:  97.5 HR: 91 Doppler Pressure: 84 Automatic BP: 105/7 (80) O2 Sat: 96% on 3 L [place] Wt: 178.8>  lbs   LVAD interrogation reveals:  Speed: 5600 Flow: 4.2 Power: 4.1 w PI: 4.6 Alarms: none Events: 100 + PI events Hct: 30  Fixed speed:  5600 Low speed limit:  5300  Drive Line: Existing VAD dressing clean, dry, intact Drive line anchor x 2 intact. Continue daily sterile wet to dry dressing changes per bedside RN or VAD coordinator. Next dressing change due 10/02/22.   Labs:  LDH trend: 132  INR trend: 3.5  CRP trend: 5.0  Sed rate trend: 28  Anticoagulation Plan: -INR Goal: 2.0 - 2.5 -ASA Dose: none  ICD: N/A  Drips:    Infection:  09/30/22>> blood cultures>> no growth <24 hrs; final pending  Plan/Recommendations:  Page VAD coordinators for equipment or drive line issues Daily drive line dressing changes per VAD coordinator or bedside RN   Emerson Monte RN [place] Coordinator  Office: [phone]  24/7 Pager: [phone]

## 2022-10-02 ENCOUNTER — Inpatient Hospital Stay: Payer: 59 | Admitting: Internal Medicine

## 2022-10-02 DIAGNOSIS — M25531 Pain in right wrist: Secondary | ICD-10-CM | POA: Diagnosis not present

## 2022-10-02 DIAGNOSIS — M25431 Effusion, right wrist: Secondary | ICD-10-CM | POA: Diagnosis not present

## 2022-10-02 DIAGNOSIS — T827XXA Infection and inflammatory reaction due to other cardiac and vascular devices, implants and grafts, initial encounter: Secondary | ICD-10-CM | POA: Diagnosis not present

## 2022-10-02 LAB — GLUCOSE, CAPILLARY
Glucose-Capillary: 125 mg/dL — ABNORMAL HIGH (ref 70–99)
Glucose-Capillary: 110 mg/dL — ABNORMAL HIGH (ref 70–99)
Glucose-Capillary: 129 mg/dL — ABNORMAL HIGH (ref 70–99)
Glucose-Capillary: 134 mg/dL — ABNORMAL HIGH (ref 70–99)

## 2022-10-02 LAB — CBC
HCT: 31.2 % — ABNORMAL LOW (ref 39.0–52.0)
Hemoglobin: 10.8 g/dL — ABNORMAL LOW (ref 13.0–17.0)
MCH: 30.6 pg (ref 26.0–34.0)
MCHC: 34.6 g/dL (ref 30.0–36.0)
MCV: 88.4 fL (ref 80.0–100.0)
Platelets: 220 10*3/uL (ref 150–400)
RBC: 3.53 MIL/uL — ABNORMAL LOW (ref 4.22–5.81)
RDW: 14.1 % (ref 11.5–15.5)
WBC: 6.3 10*3/uL (ref 4.0–10.5)
nRBC: 0 % (ref 0.0–0.2)

## 2022-10-02 LAB — BASIC METABOLIC PANEL
Anion gap: 9 (ref 5–15)
BUN: 17 mg/dL (ref 8–23)
CO2: 26 mmol/L (ref 22–32)
Calcium: 9 mg/dL (ref 8.9–10.3)
Chloride: 100 mmol/L (ref 98–111)
Creatinine, Ser: 0.98 mg/dL (ref 0.61–1.24)
GFR, Estimated: >60 mL/min (ref >60)
Glucose, Bld: 128 mg/dL — ABNORMAL HIGH (ref 70–99)
Potassium: 4.1 mmol/L (ref 3.5–5.1)
Sodium: 135 mmol/L (ref 135–145)

## 2022-10-02 LAB — MAGNESIUM: Magnesium: 2.3 mg/dL (ref 1.7–2.4)

## 2022-10-02 LAB — PROTIME-INR
INR: 2.7 — ABNORMAL HIGH (ref 0.8–1.2)
Prothrombin Time: 28.6 seconds — ABNORMAL HIGH (ref 11.4–15.2)

## 2022-10-02 LAB — LACTATE DEHYDROGENASE: LDH: 140 U/L (ref 98–192)

## 2022-10-02 MED ORDER — GABAPENTIN 600 MG PO TABS
600.0000 mg | ORAL_TABLET | Freq: Three times a day (TID) | ORAL | Status: DC
  Administered 2022-10-02 – 2022-10-03 (×3): 600 mg via ORAL
  Filled 2022-10-02 (×3): qty 1

## 2022-10-02 MED ORDER — WARFARIN SODIUM 2 MG PO TABS
4.0000 mg | ORAL_TABLET | Freq: Once | ORAL | Status: AC
  Administered 2022-10-02: 4 mg via ORAL
  Filled 2022-10-02: qty 2

## 2022-10-02 MED ORDER — TRAMADOL HCL 50 MG PO TABS
50.0000 mg | ORAL_TABLET | Freq: Two times a day (BID) | ORAL | Status: DC | PRN
  Administered 2022-10-02: 50 mg via ORAL
  Filled 2022-10-02: qty 1

## 2022-10-02 NOTE — Progress Notes (Signed)
[facility]  Date of Admission:  09/30/2022     Total days of antibiotics 3         ASSESSMENT:  Kevin Underwood is improved today with decreased pain and increased mobility of the right wrist. CT wrist with no evidence of fluid collection or abscess and diffuse tissue swelling and subcutaneous fat inflammatory stranding. Suspect this is likely a gout flare as it would be unusual for antibiotics to work in septic arthritis this quickly and there is no evidence of joint effusion. Recommend continuing with gout treatment with colchicine and allopurinol per primary team. 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 Remaining medical and supportive care per primary team. Will arrange follow up in ID office.   PLAN:  Ok to resume minocycline prior to discharge tomorrow for chronic drive line infection. Continue treatment for gout. Close follow up with ID.  Remaining medical and supportive care per primary team.   ID will sign off. Please re-consult if needed.    Principal Problem:   Pain and swelling of right wrist    allopurinol  100 mg Oral Daily   Chlorhexidine Gluconate Cloth  6 each Topical Daily   colchicine  0.6 mg Oral Daily   gabapentin  600 mg Oral TID   hydrALAZINE  12.5 mg Oral TID   insulin aspart  0-15 Units Subcutaneous TID WC   methimazole  5 mg Oral Q breakfast   metoprolol succinate  25 mg Oral Daily   mupirocin ointment  1 Application Nasal BID   pantoprazole  40 mg Oral Daily   rosuvastatin  20 mg Oral Daily   sertraline  50 mg Oral Daily   tamsulosin  0.4 mg Oral Daily   warfarin  4 mg Oral ONCE-1600   Warfarin - Pharmacist Dosing Inpatient   Does not apply q1600   zinc sulfate  220 mg Oral Daily    SUBJECTIVE:  Afebrile overnight with no acute events. Feeling better with increased movement in the right wrist.   No Known Allergies   Review of Systems: Review of  Systems  Constitutional:  Negative for chills, fever and weight loss.  Respiratory:  Negative for cough, shortness of breath and wheezing.   Cardiovascular:  Negative for chest pain and leg swelling.  Gastrointestinal:  Negative for abdominal pain, constipation, diarrhea, nausea and vomiting.  Musculoskeletal:        Positive for right wrist pain.   Skin:  Negative for rash.      OBJECTIVE: Vitals:   10/02/22 0600 10/02/22 0810 10/02/22 1000 10/02/22 1131  BP: 96/80 91/74    Pulse: 62 60 65   Resp: 12     Temp:  98.7 F (37.1 C)  98.5 F (36.9 C)  TempSrc:  Oral  Oral  SpO2: 100% 100%    Weight:      Height:       Body mass index is 25.65 kg/m.  Physical Exam Constitutional:      General: He is not in acute distress.    Appearance: He is well-developed.  Cardiovascular:     Rate and Rhythm: Normal rate and regular rhythm.     Heart sounds: Normal heart sounds.  Pulmonary:     Effort: Pulmonary effort is normal.     Breath sounds: Normal breath sounds.  Musculoskeletal:     Comments: Right wrist with increased motion and decreased tenderness.   Skin:  General: Skin is warm and dry.  Neurological:     Mental Status: He is alert and oriented to person, place, and time.  Psychiatric:        Behavior: Behavior normal.        Thought Content: Thought content normal.        Judgment: Judgment normal.     Lab Results Lab Results  Component Value Date   WBC 6.3 10/02/2022   HGB 10.8 (L) 10/02/2022   HCT 31.2 (L) 10/02/2022   MCV 88.4 10/02/2022   PLT 220 10/02/2022    Lab Results  Component Value Date   CREATININE 0.98 10/02/2022   BUN 17 10/02/2022   NA 135 10/02/2022   K 4.1 10/02/2022   CL 100 10/02/2022   CO2 26 10/02/2022    Lab Results  Component Value Date   ALT 22 09/30/2022   AST 18 09/30/2022   ALKPHOS 78 09/30/2022   BILITOT 0.3 09/30/2022     Microbiology: Recent Results (from the past 240 hour(s))  Aerobic Culture w Gram Stain  (superficial specimen)     Status: None   Collection Time: 09/26/22  3:02 PM   Specimen: Wound  Result Value Ref Range Status   Specimen Description WOUND  Final   Special Requests LVAD DRIVELINE  Final   Gram Stain   Final    NO WBC SEEN NO ORGANISMS SEEN Performed at [facility], [address] [place], [place] [address]    Culture   Final    FEW ACINETOBACTER CALCOACETICUS/BAUMANNII COMPLEX RARE METHICILLIN RESISTANT STAPHYLOCOCCUS AUREUS    Report Status 09/30/2022 FINAL  Final   Organism ID, Bacteria ACINETOBACTER CALCOACETICUS/BAUMANNII COMPLEX  Final   Organism ID, Bacteria METHICILLIN RESISTANT STAPHYLOCOCCUS AUREUS  Final      Susceptibility   Acinetobacter calcoaceticus/baumannii complex - MIC*    CEFTAZIDIME 16 INTERMEDIATE Intermediate     CIPROFLOXACIN 0.5 SENSITIVE Sensitive     GENTAMICIN <=1 SENSITIVE Sensitive     IMIPENEM <=0.25 SENSITIVE Sensitive     PIP/TAZO >=128 RESISTANT Resistant     TRIMETH/SULFA <=20 SENSITIVE Sensitive     AMPICILLIN/SULBACTAM <=2 SENSITIVE Sensitive     * FEW ACINETOBACTER CALCOACETICUS/BAUMANNII COMPLEX   Methicillin resistant staphylococcus aureus - MIC*    CIPROFLOXACIN >=8 RESISTANT Resistant     ERYTHROMYCIN >=8 RESISTANT Resistant     GENTAMICIN <=0.5 SENSITIVE Sensitive     OXACILLIN >=4 RESISTANT Resistant     TETRACYCLINE <=1 SENSITIVE Sensitive     VANCOMYCIN <=0.5 SENSITIVE Sensitive     TRIMETH/SULFA >=320 RESISTANT Resistant     CLINDAMYCIN <=0.25 SENSITIVE Sensitive     RIFAMPIN <=0.5 SENSITIVE Sensitive     Inducible Clindamycin NEGATIVE Sensitive     * RARE METHICILLIN RESISTANT STAPHYLOCOCCUS AUREUS  Culture, blood (Routine X 2) w Reflex to ID Panel     Status: None (Preliminary result)   Collection Time: 09/30/22  6:46 PM   Specimen: BLOOD LEFT ARM  Result Value Ref Range Status   Specimen Description BLOOD LEFT ARM  Final   Special Requests   Final    BOTTLES DRAWN AEROBIC AND ANAEROBIC Blood  Culture adequate volume   Culture   Final    NO GROWTH 2 DAYS Performed at [facility], [address] [place], [place] [address]    Report Status PENDING  Incomplete  Culture, blood (Routine X 2) w Reflex to ID Panel     Status: None (Preliminary result)  Collection Time: 09/30/22  6:47 PM   Specimen: BLOOD LEFT ARM  Result Value Ref Range Status   Specimen Description BLOOD LEFT ARM  Final   Special Requests   Final    BOTTLES DRAWN AEROBIC AND ANAEROBIC Blood Culture adequate volume   Culture   Final    NO GROWTH 2 DAYS Performed at [facility], [address], [place], [place] [address]    Report Status PENDING  Incomplete  Surgical PCR screen     Status: None   Collection Time: 09/30/22  8:56 PM   Specimen: Nasal Mucosa; Nasal Swab  Result Value Ref Range Status   MRSA, PCR NEGATIVE NEGATIVE Final   Staphylococcus aureus NEGATIVE NEGATIVE Final    Comment: (NOTE) The Xpert SA Assay (FDA approved for NASAL specimens in patients 39 years of age and older), is one component of a comprehensive surveillance program. It is not intended to diagnose infection nor to guide or monitor treatment. Performed at [facility], [place] [address] [place], [place] [address]      Terri Piedra, [facility]  10/02/2022  11:36 AM

## 2022-10-02 NOTE — Progress Notes (Signed)
ANTICOAGULATION CONSULT NOTE -Follow Up Consult  Pharmacy Consult for Warfarin Indication: VAD  No Known Allergies  Patient Measurements: Height: 5\' 10"  (177.8 cm) Weight: 81.1 kg (178 lb 12.7 oz) IBW/kg (Calculated) : 73  Vital Signs: Temp: 98.3 F (36.8 C) (10/25 0335) Temp Source: Oral (10/25 0335) BP: 91/74 (10/25 0810) Pulse Rate: 62 (10/25 0600)  Labs: Recent Labs    09/29/22 0920 09/29/22 1126 09/30/22 1513 10/01/22 0532 10/01/22 2055 10/02/22 0530  HGB 11.2*  --  10.6* 10.5*  --  10.8*  HCT 31.1*  --  30.3* 29.7*  --  31.2*  PLT 196  --  224 205  --  220  LABPROT 40.5*  --  42.6* 34.7*  --  28.6*  INR 4.3*  --  4.5* 3.5*  --  2.7*  CREATININE 1.15  --  1.09 1.12 1.17 0.98  TROPONINIHS 7 8  --   --   --   --      Estimated Creatinine Clearance: 81.7 mL/min (by C-G formula based on SCr of 0.98 mg/dL).   Medical History: Past Medical History:  Diagnosis Date   "    Arthritis    CAD (coronary artery disease)    a. s/p CABG in 11/2019 with LIMA-LAD, SVG-OM1, SVG-PDA and SVG-D1   CHF (congestive heart failure) (HCC)    a. EF < 20% by echo in 11/2019   Essential hypertension    PAF (paroxysmal atrial fibrillation) (HCC)    Type 2 diabetes mellitus (HCC)     Medications:  Scheduled:   allopurinol  100 mg Oral Daily   Chlorhexidine Gluconate Cloth  6 each Topical Daily   colchicine  0.6 mg Oral Daily   gabapentin  600 mg Oral BID   hydrALAZINE  12.5 mg Oral TID   insulin aspart  0-15 Units Subcutaneous TID WC   methimazole  5 mg Oral Q breakfast   metoprolol succinate  25 mg Oral Daily   mupirocin ointment  1 Application Nasal BID   pantoprazole  40 mg Oral Daily   rosuvastatin  20 mg Oral Daily   sertraline  50 mg Oral Daily   tamsulosin  0.4 mg Oral Daily   Warfarin - Pharmacist Dosing Inpatient   Does not apply q1600   zinc sulfate  220 mg Oral Daily   Infusions:   ampicillin-sulbactam (UNASYN) IV 200 mL/hr at 10/02/22 0630   vancomycin  Stopped (10/01/22 2250)    Assessment: 32 YOM admitted for VAD driveline infection on warfarin prior to admission.   PTA regimen warfarin  6mg  MWF and 4mg  AOD  INR elevated on admit, now down to 2.7. No procedures planned. LDH and CBC stable.  Goal of Therapy:  INR 2-2.5 Monitor platelets by anticoagulation protocol: Yes   Plan:  Warfarin 4mg  PO x1 Daily INR  Kevin Underwood, PharmD, BCPS, [facility] Clinical Pharmacist [phone] Please check AMION for all [place] numbers 10/02/2022

## 2022-10-02 NOTE — Progress Notes (Signed)
2340:Pain has continued to be uncontrolled despite all PRN medications given.  Dr. Chancy Milroy made aware.  See new orders  Patient says that the splint on his wrist makes the pain worse and has chosen to not wear it this shift.  0245: VAD alarming to replace back up battery VAD coordinator notified, battery will be replaced by VAD team this morning

## 2022-10-02 NOTE — Progress Notes (Addendum)
LVAD Coordinator Rounding Note:  Admitted 09/30/22 to Dr. Claris Gladden service due to right wrist swelling and pain. Started on IV antibiotics.   Pt lying in bed on my arrival. States wrist pain has improved with PRN Dilaudid overnight. Gabapentin increased to TID today per NP. Redness has resolved. Tissue swelling present.  On empiric Vancomycin and Unasyn.   CT right wrist:  1. Diffuse soft tissue swelling and subcutaneous fat inflammatory stranding. No walled-off fluid collection is seen to indicate an abscess. 2. Tiny ossicle that appears with a proximal non corticated margin near similar non corticated irregularity of the distal dorsal aspect of the lunate, a possible tiny cortical chip fracture. 3. Moderate to severe osteoarthritis as above, greatest within the index finger and thumb carpometacarpal joints.  Received page overnight for a "back up battery fault" alarm. Alarm resolved itself at 0247 with no further occurrence per VAD interrogation. If alarm occurs again will exchange controller back up battery.   D/c antibiotic plan per ID: Minocycline. VAD clinic f/u appt 10/14/22 at 0830.   Vital signs: Temp:  98.7 HR: 60 Doppler Pressure: 80 Automatic BP: 91/74 (82) O2 Sat: 100% on 2 L [place] Wt: 178.8>  lbs   LVAD interrogation reveals:  Speed: 5600 Flow: 4.2 Power: 4.1 w PI: 4.6 Alarms: none Events: 100 + PI events Hct: 30  Fixed speed:  5600 Low speed limit:  5300  Drive Line: Existing VAD dressing clean, dry, intact Drive line anchor x 2 intact. Continue every other day sterile wet to dry dressing changes per bedside RN or VAD coordinator. Next dressing change due 10/03/22.   Labs:  LDH trend: 132>140  INR trend: 3.5>2.7  CRP trend: 5.0  Sed rate trend: 28  Anticoagulation Plan: -INR Goal: 2.0 - 2.5 -ASA Dose: none  ICD: N/A  Drips:    Infection:  09/30/22>> blood cultures>> no growth 2 days; final pending  Plan/Recommendations:  Page VAD coordinators  for equipment or drive line issues Every other day drive line dressing changes per VAD coordinator or bedside RN   Emerson Monte RN [place] Coordinator  Office: [phone]  24/7 Pager: [phone]

## 2022-10-02 NOTE — Discharge Summary (Signed)
Advanced Heart Failure Team  Discharge Summary   Patient ID: Kevin Underwood. MRN: [id number], DOB/AGE: January 08, 1961 61 y.o. Admit date: 09/30/2022 D/C date:     10/03/2022   Primary Discharge Diagnoses:  Right wrist pain/swelling: 2.  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   3.  Chronic systolic CHF 4. CKD stage 3 5.  CAD: S/p CABG 12/20.   6.  Atrial fibrillation: Paroxysmal.  S/p Maze and LA appendage clip with CABG in 12/20. 7.  Type 2 diabetes 8. Methamphetamine abuse.  9. Hyperthyroidism 10. Anemia (chronic blood loss) 11. Presyncope  Hospital Course:  Kevin Underwood is a 61 with a history of  chronic HFrEF, HMIII VAD, CABD 2020 Maze/LAA clipping, PAF, DMII, COPD, and HTN.   H/O HMIII - Multiple driveline infections/debridements. Now on chronic suppression. Grew acinetobacter and MRSA on culture. Followed by ID. Now on minocycline 200 BID.    Seen in ED 09/29/22 with right wrist pain. Pain felt to be d/t gout. WBC 9.5, LDH 161>332, INR 4.3.  He was felt to be stable for discharge home.   Admitted 09/30/22 with presyncope and and right hand/wrist cellulitis.  He had bought oxycodone laced with methamphetamine about 5 days ago. UDS was + opiates and amphetamines. Blood cultures obtained. No growth. Placed on vanc and meropenum. Uric acid 2.9 CRP 5 Sed rate 28. Suspect cellulitis and gout. Continue allopurinol + colchicine daily Discussed with Ortho.CT wrist negative for effusion. No aspiration recommended. He had gradual improvement with IV antibiotics.  ID recommended resume minocycline at d/c. Given long standing history of substance abuse Dr Aundra Dubin recommended discharge with 5 days of Ultram. He will need to be referred to the pain clinic.   From HF/VAD perspective he remained stable. INR/LDH stable. Continue every other day dressing changes around the driveline. He will continue to be  followed in the VAD clinic.   LVAD Interrogation HM III:   Speed: 5600     Flow:  3.4    PI:    7.5  Power:  4     Back-up speed:  5600      Discharge Vitals: Blood pressure (!) 147/115, pulse 71, temperature 98.7 F (37.1 C), temperature source Oral, resp. rate 14, height _0  (1.778 m), weight 82 kg, SpO2 (!) 74 %.  Labs: Lab Results  Component Value Date   WBC 7.4 10/03/2022   HGB 10.9 (L) 10/03/2022   HCT 31.9 (L) 10/03/2022   MCV 89.4 10/03/2022   PLT 238 10/03/2022    Recent Labs  Lab 09/30/22 1513 10/01/22 0532 10/03/22 0603  NA 138   < > 135  K 3.5   < > 4.4  CL 103   < > 97*  CO2 23   < > 27  BUN 19   < > 20  CREATININE 1.09   < > 0.99  CALCIUM 8.8*   < > 9.0  PROT 6.5  --   --   BILITOT 0.3  --   --   ALKPHOS 78  --   --   ALT 22  --   --   AST 18  --   --   GLUCOSE 128*   < > 124*   < > = values in this interval not displayed.   Lab Results  Component Value Date   CHOL 142 09/11/2021   HDL 62 09/11/2021   LDLCALC 61  09/11/2021   TRIG 95 09/11/2021   BNP (last 3 results) Recent Labs    05/17/22 0153  BNP 222.1*    ProBNP (last 3 results) No results for input(s): "PROBNP" in the last 8760 hours.   Diagnostic Studies/Procedures   CT WRIST RIGHT W CONTRAST  Result Date: 10/01/2022 CLINICAL DATA:  Soft tissue infection suspected. Soft tissue swelling and cellulitis. EXAM: CT OF THE UPPER RIGHT EXTREMITY WITH CONTRAST TECHNIQUE: Multidetector CT imaging of the upper right extremity was performed according to the standard protocol following intravenous contrast administration. RADIATION DOSE REDUCTION: This exam was performed according to the departmental dose-optimization program which includes automated exposure control, adjustment of the mA and/or kV according to patient size and/or use of iterative reconstruction technique. CONTRAST:  85m OMNIPAQUE IOHEXOL 350 MG/ML SOLN COMPARISON:  Right wrist radiographs 10/01/2022 and right hand radiographs  08/24/2012 FINDINGS: Bones/Joint/Cartilage There is again a metal presumable surgical staple at the dorsal aspect of the mid shaft of the third metacarpal, presumably postsurgical change. This is unchanged from remote 08/24/2012 radiographs. Severe thumb and index finger carpometacarpal, moderate triscaphe, and mild third through fifth carpometacarpal joint space narrowing and peripheral osteophytosis. There is a chronic well corticated ossicle measuring up to 5 mm just dorsal to the proximal lateral aspect of the capitate (coronal series 5 image 20 and sagittal series 7, image 21). Just medial to this, there is a slightly smaller, 3 mm ossicle with a proximal margin at appears non corticated. There is also irregularity of the adjacent distal dorsal aspect of the lunate, and this may represent a tiny displaced lunate fracture fragment (sagittal series 7, image 19 and coronal series 5, image 20). No cortical erosion is seen. Ligaments Suboptimally assessed by CT. Muscles and Tendons No gross tendon tear is seen. Decreased definition within the regional musculature, likely muscle edema. Soft tissues There is diffuse soft tissue swelling and subcutaneous fat inflammatory stranding. Moderate to high-grade atherosclerotic calcifications. No walled-off fluid collection is seen to indicate an abscess. IMPRESSION: 1. Diffuse soft tissue swelling and subcutaneous fat inflammatory stranding. No walled-off fluid collection is seen to indicate an abscess. 2. Tiny ossicle that appears with a proximal non corticated margin near similar non corticated irregularity of the distal dorsal aspect of the lunate, a possible tiny cortical chip fracture. 3. Moderate to severe osteoarthritis as above, greatest within the index finger and thumb carpometacarpal joints. Electronically Signed   By: RYvonne KendallM.D.   On: 10/01/2022 17:23    Discharge Medications   Allergies as of 10/03/2022   No Known Allergies      Medication List      STOP taking these medications    amiodarone 200 MG tablet Commonly known as: PACERONE   levofloxacin 750 MG tablet Commonly known as: LEVAQUIN       TAKE these medications    albuterol 108 (90 Base) MCG/ACT inhaler Commonly known as: VENTOLIN HFA Inhale 2 puffs into the lungs every 6 (six) hours as needed for wheezing or shortness of breath.   albuterol (2.5 MG/3ML) 0.083% nebulizer solution Commonly known as: PROVENTIL INHALE 3 ML BY NEBULIZATION EVERY 6 HOURS AS NEEDED FOR WHEEZING OR SHORTNESS OF BREATH   allopurinol 100 MG tablet Commonly known as: ZYLOPRIM Take 1 tablet (100 mg total) by mouth daily.   blood glucose meter kit and supplies Kit Dispense based on patient and insurance preference. Use to check CBG's three times a day. (FOR ICD-9 250.00, 250.01).   Breo Ellipta 100-25 MCG/ACT  Aepb Generic drug: fluticasone furoate-vilanterol Inhale 1 puff into the lungs daily.   colchicine 0.6 MG tablet Take 1 tablet (0.6 mg total) by mouth daily. What changed:  when to take this reasons to take this   Combivent Respimat 20-100 MCG/ACT Aers respimat Generic drug: Ipratropium-Albuterol Inhale 1 puff into the lungs every 6 (six) hours.   Flonase Allergy Relief 50 MCG/ACT nasal spray Generic drug: fluticasone Place 2 sprays into both nostrils as needed for allergies or rhinitis.   furosemide 40 MG tablet Commonly known as: LASIX TAKE 0.5 TABLETS (20 MG TOTAL) BY MOUTH DAILY AS NEEDED. What changed: reasons to take this   gabapentin 600 MG tablet Commonly known as: NEURONTIN Take 1 tablet (600 mg total) by mouth 3 (three) times daily. What changed: when to take this   glipiZIDE 5 MG tablet Commonly known as: Glucotrol Take 1 tablet (5 mg total) by mouth daily.   hydrALAZINE 25 MG tablet Commonly known as: APRESOLINE Take 0.5 tablets (12.5 mg total) by mouth 3 (three) times daily.   loratadine 10 MG tablet Commonly known as: CLARITIN Take 1 tablet  (10 mg total) by mouth daily.   metFORMIN 500 MG tablet Commonly known as: GLUCOPHAGE Take 1 tablet (500 mg total) by mouth 2 (two) times daily with a meal.   methimazole 5 MG tablet Commonly known as: TAPAZOLE TAKE 1 TABLET BY MOUTH EVERY DAY WITH BREAKFAST What changed: See the new instructions.   metoprolol succinate 25 MG 24 hr tablet Commonly known as: Toprol XL Take 1 tablet (25 mg total) by mouth daily.   minocycline 100 MG capsule Commonly known as: Minocin Take 2 capsules (200 mg total) by mouth 2 (two) times daily.   multivitamin with minerals Tabs tablet Take 1 tablet by mouth daily.   pantoprazole 40 MG tablet Commonly known as: PROTONIX TAKE 1 TABLET BY MOUTH EVERY DAY   REDNESS RELIEVER EYE DROPS OP Apply 2 drops to eye daily as needed.   rosuvastatin 20 MG tablet Commonly known as: CRESTOR Take 20 mg by mouth daily.   sertraline 50 MG tablet Commonly known as: ZOLOFT Take 1 tablet (50 mg total) by mouth daily.   SM Mucus Relief 600 MG 12 hr tablet Generic drug: guaiFENesin Take 1 tablet (600 mg total) by mouth 2 (two) times daily.   tamsulosin 0.4 MG Caps capsule Commonly known as: FLOMAX Take 0.4 mg by mouth daily.   traMADol 50 MG tablet Commonly known as: ULTRAM Take 1 tablet (50 mg total) by mouth 2 (two) times daily as needed.   traZODone 150 MG tablet Commonly known as: DESYREL Take 1 tablet (150 mg total) by mouth at bedtime.   Trelegy Ellipta 100-62.5-25 MCG/ACT Aepb Generic drug: Fluticasone-Umeclidin-Vilant Inhale 1 puff into the lungs daily.   warfarin 4 MG tablet Commonly known as: COUMADIN Take as directed. If you are unsure how to take this medication, talk to your nurse or doctor. Original instructions: On 10/5 take 4 mg then resume 6 mg (1.5 tabs) on Mon/Wed/Fri and 4 mg (1 tab) all other days or as directed by HF Clinic What changed:  when to take this additional instructions        Disposition   The patient will  be discharged in stable condition to home. Discharge Instructions     (HEART FAILURE PATIENTS) Call MD:  Anytime you have any of the following symptoms: 1) 3 pound weight gain in 24 hours or 5 pounds in 1 week 2) shortness  of breath, with or without a dry hacking cough 3) swelling in the hands, feet or stomach 4) if you have to sleep on extra pillows at night in order to breathe.   Complete by: As directed    Diet - low sodium heart healthy   Complete by: As directed    INR  Goal: 2 - 2.5   Complete by: As directed    Goal: 2 - 2.5   Increase activity slowly   Complete by: As directed    Page VAD Coordinator at [phone]  Notify for: any VAD alarms, sustained elevations of power >10 watts, sustained drop in Pulse Index <3   Complete by: As directed    Notify for:  any VAD alarms sustained elevations of power >10 watts sustained drop in Pulse Index <3     Speed Settings:   Complete by: As directed    Fixed 5600 RPM Low 5300 RPM       Follow-up Information     Comer, Okey Regal, MD Follow up.   Specialty: Infectious Diseases Why: 10/02/22 at 9:15 am. If you are not able to make this appointment please call to reschedule. Contact information: [address] [place] [address] [phone]                   Duration of Discharge Encounter: Greater than 35 minutes   Signed, Calder Oblinger NP-C   10/03/2022, 10:18 AM

## 2022-10-02 NOTE — Progress Notes (Addendum)
Advanced Heart Failure VAD Team Note  PCP-Cardiologist: Rozann Lesches, MD   Subjective:   Admitted r wrist pain/swelling. Started on IV antibiotics.   Bld CX- NGTD CT wrist - no effusion.   Having less wrist pain. Feeling better.    LVAD INTERROGATION:  HeartMate III  LVAD:   Flow liters/min, speed 5600, power 4. , PI 4.2 .  Battery replacement x 2  Resolved this morning.   Objective:    Vital Signs:   Temp:  [97.8 F (36.6 C)-98.3 F (36.8 C)] 98.3 F (36.8 C) (10/25 0335) Pulse Rate:  [62-190] 62 (10/25 0600) Resp:  [0-27] 12 (10/25 0600) BP: (65-121)/(47-99) 96/80 (10/25 0600) SpO2:  [76 %-100 %] 100 % (10/25 0600) Last BM Date : 09/29/22 Mean arterial Pressure 80s    Intake/Output:   Intake/Output Summary (Last 24 hours) at 10/02/2022 0804 Last data filed at 10/02/2022 0630 Gross per 24 hour  Intake 1109.88 ml  Output 1325 ml  Net -215.12 ml     Physical Exam    Physical Exam: GENERAL: No acute distress. HEENT: normal  NECK: Supple, JVP flat  .  2+ bilaterally, no bruits.  No lymphadenopathy or thyromegaly appreciated.   CARDIAC:  Mechanical heart sounds with LVAD hum present.  LUNGS:  Clear to auscultation bilaterally.  ABDOMEN:  Soft, round, nontender, positive bowel sounds x4.     LVAD exit site: Dressing dry and intact.  No erythema or drainage.  Stabilization device present and accurately applied.  Driveline dressing is being changed daily per sterile technique. EXTREMITIES:  Warm and dry, no cyanosis, clubbing, rash or edema . R wrist pain resolving.  NEUROLOGIC:  Alert and oriented x 3.    No aphasia.  No dysarthria.  Affect pleasant.     Telemetry  SR 80s-90s    N Labs   Basic Metabolic Panel: Recent Labs  Lab 09/29/22 0920 09/30/22 1513 10/01/22 0532 10/01/22 2055 10/02/22 0530  NA 135 138 135 137 135  K 4.0 3.5 3.5 4.0 4.1  CL 100 103 102 101 100  CO2 21* 23 23 25 26   GLUCOSE 138* 128* 131* 151* 128*  BUN 22 19 17 20 17    CREATININE 1.15 1.09 1.12 1.17 0.98  CALCIUM 8.9 8.8* 8.7* 8.9 9.0  MG  --   --   --  1.4* 2.3    Liver Function Tests: Recent Labs  Lab 09/29/22 0920 09/30/22 1513  AST 44* 18  ALT 30 22  ALKPHOS 85 78  BILITOT 1.2 0.3  PROT 6.5 6.5  ALBUMIN 3.8 3.3*   No results for input(s): "LIPASE", "AMYLASE" in the last 168 hours. No results for input(s): "AMMONIA" in the last 168 hours.  CBC: Recent Labs  Lab 09/29/22 0920 09/30/22 1513 10/01/22 0532 10/02/22 0530  WBC 9.5 8.5 6.8 6.3  NEUTROABS 7.2  --   --   --   HGB 11.2* 10.6* 10.5* 10.8*  HCT 31.1* 30.3* 29.7* 31.2*  MCV 86.6 88.1 87.6 88.4  PLT 196 224 205 220    INR: Recent Labs  Lab 09/29/22 0920 09/30/22 1513 10/01/22 0532 10/02/22 0530  INR 4.3* 4.5* 3.5* 2.7*    Other results: EKG:    Imaging   CT WRIST RIGHT W CONTRAST  Result Date: 10/01/2022 CLINICAL DATA:  Soft tissue infection suspected. Soft tissue swelling and cellulitis. EXAM: CT OF THE UPPER RIGHT EXTREMITY WITH CONTRAST TECHNIQUE: Multidetector CT imaging of the upper right extremity was performed according to the standard protocol  following intravenous contrast administration. RADIATION DOSE REDUCTION: This exam was performed according to the departmental dose-optimization program which includes automated exposure control, adjustment of the mA and/or kV according to patient size and/or use of iterative reconstruction technique. CONTRAST:  50mL OMNIPAQUE IOHEXOL 350 MG/ML SOLN COMPARISON:  Right wrist radiographs 10/01/2022 and right hand radiographs 08/24/2012 FINDINGS: Bones/Joint/Cartilage There is again a metal presumable surgical staple at the dorsal aspect of the mid shaft of the third metacarpal, presumably postsurgical change. This is unchanged from remote 08/24/2012 radiographs. Severe thumb and index finger carpometacarpal, moderate triscaphe, and mild third through fifth carpometacarpal joint space narrowing and peripheral osteophytosis.  There is a chronic well corticated ossicle measuring up to 5 mm just dorsal to the proximal lateral aspect of the capitate (coronal series 5 image 20 and sagittal series 7, image 21). Just medial to this, there is a slightly smaller, 3 mm ossicle with a proximal margin at appears non corticated. There is also irregularity of the adjacent distal dorsal aspect of the lunate, and this may represent a tiny displaced lunate fracture fragment (sagittal series 7, image 19 and coronal series 5, image 20). No cortical erosion is seen. Ligaments Suboptimally assessed by CT. Muscles and Tendons No gross tendon tear is seen. Decreased definition within the regional musculature, likely muscle edema. Soft tissues There is diffuse soft tissue swelling and subcutaneous fat inflammatory stranding. Moderate to high-grade atherosclerotic calcifications. No walled-off fluid collection is seen to indicate an abscess. IMPRESSION: 1. Diffuse soft tissue swelling and subcutaneous fat inflammatory stranding. No walled-off fluid collection is seen to indicate an abscess. 2. Tiny ossicle that appears with a proximal non corticated margin near similar non corticated irregularity of the distal dorsal aspect of the lunate, a possible tiny cortical chip fracture. 3. Moderate to severe osteoarthritis as above, greatest within the index finger and thumb carpometacarpal joints. Electronically Signed   By: [name] M.D.   On: 10/01/2022 17:23   DG Wrist 2 Views Right  Result Date: 10/01/2022 CLINICAL DATA:  Pain and swelling EXAM: RIGHT WRIST - 2 VIEW COMPARISON:  Right hand done on 08/24/2012 FINDINGS: No recent fracture or dislocation is seen. Degenerative changes are noted with bony spurs in the first carpometacarpal joint. Degenerative changes are also noted in first, second and third metacarpophalangeal joints and the interphalangeal joint of the thumb. Small bony spurs are noted in the intercarpal joints along the lateral aspect of  the wrist. There is metallic wire in the mid shaft of fourth metacarpal with no change. There is interval healing of fracture of shaft of fifth metacarpal. Significant arterial calcifications are seen. IMPRESSION: No fracture or dislocation is seen. Degenerative changes are noted in multiple joints as described in the body of the report. Fairly extensive arterial calcifications are seen suggesting significant arteriosclerosis. Electronically Signed   By: Elmer Picker M.D.   On: 10/01/2022 09:22   [place] RT UPPER EXTREM LTD SOFT TISSUE NON VASCULAR  Result Date: 09/30/2022 CLINICAL DATA:  Right wrist pain EXAM: ULTRASOUND RIGHT UPPER EXTREMITY LIMITED TECHNIQUE: Ultrasound examination of the upper extremity soft tissues was performed in the area of clinical concern. COMPARISON:  None Available. FINDINGS: There appears to be mild tenosynovitis in the region of concern for example on image 7 of series 1 -1. IMPRESSION: 1. Suspected tenosynovitis in the region of concern. MRI could be utilized for confirmation. Electronically Signed   By: Van Clines M.D.   On: 09/30/2022 19:29     Medications:  Scheduled Medications:  allopurinol  100 mg Oral Daily   Chlorhexidine Gluconate Cloth  6 each Topical Daily   colchicine  0.6 mg Oral Daily   gabapentin  600 mg Oral BID   hydrALAZINE  12.5 mg Oral TID   insulin aspart  0-15 Units Subcutaneous TID WC   methimazole  5 mg Oral Q breakfast   metoprolol succinate  25 mg Oral Daily   mupirocin ointment  1 Application Nasal BID   pantoprazole  40 mg Oral Daily   rosuvastatin  20 mg Oral Daily   sertraline  50 mg Oral Daily   tamsulosin  0.4 mg Oral Daily   Warfarin - Pharmacist Dosing Inpatient   Does not apply q1600   zinc sulfate  220 mg Oral Daily    Infusions:  ampicillin-sulbactam (UNASYN) IV 200 mL/hr at 10/02/22 0630   vancomycin Stopped (10/01/22 2250)    PRN Medications: acetaminophen, HYDROmorphone (DILAUDID) injection,  ondansetron (ZOFRAN) IV, oxyCODONE, traMADol   Patient Profile     Assessment/Plan:   Right wrist pain/swelling: -? Gout versus septic arthritis given hx of driveline infection and substance abuse. Denies any recent IV drug use.  Uric acid 2.9 CRP 5 Sed rate 28  - Continue allopurinol and started colchicine. Does have hx gout in his feet. - Discussed with ortho.  - Bld Cx- NGTD - Discussed with Hilbert Odor PA-C with ortho.  -CT wrist- no effusion. No need for aspiration.  Continue antibiotics. Continue vanc and Unasyn.  - Continue removable splint  2.  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  He has completed vancomycin and was on long-term doxycycline. Recently grew acinetobacter and completed course of levofloxacin. Has been back on doxycycline, most recent culture grew acinetobacter and MRSA. ID recommended minocycline.  - Hold minocycline while on vanc and meropenem, will need to add back at discharge. 3.  Chronic systolic CHF: Echo [id number] with EF 20-25%, mildly decreased RV function. LHC/RHC in 12/21 with patent grafts, low output. Suspect mixed ischemic/nonischemic cardiomyopathy (prior heavy ETOH and drugs as well as CAD).  No ETOH, drugs, smoking since CABG in 12/20. Admitted with cardiogenic shock in 12/21, had placement of Impella 5.5 initially, now s/p Heartmate 3 LVAD on 11/17/20.  Ramp echo 2/23 with speed decreased to 5500 rpm. He would eventually like heart transplant. Speed now back to 5600 rpm.  - From HF perspective stable.  - Volume status stable.  - Continue current Toprol XL and hydralazine 12.5 TID -  Continue warfarin, INR goal 2-2.5. INR 2.7  today.  - Restart coumadin. No plan for surgery.  . 4. 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 D/w Renal and feel like he may need higher perfusion pressures.  Creatinine has been lower recently.  - Would consider eventual reassessment of renal artery stenosis and possible treatment  - Renl function stable.  5.  CAD: S/p CABG 12/20.  LHC pre-VAD with patent grafts, no target for intervention. - Continue statin.  6.  Atrial fibrillation: Paroxysmal.  S/p Maze and LA appendage clip with CABG in 12/20. -In SR - Continue Toprol XL 25 mg daily.  - Off amiodarone with hyperthyroidism. 7.  Type 2 diabetes: Per PCP. On glipizide and metformin PTA.  - SSI while hospitalized 8. Methamphetamine abuse: Ongoing. Reports he bought hydrocodone off the street that was laced with methamphetamine a  few days ago. - UDS + amphetamine+ opiates.  9. 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  He is now off amiodarone. Followed by endocrinology, recently decreased methimazole.  - Continue methimazole 5 mg daily.  - Had endocrinology followup.   10. Anemia (chronic blood loss): Hgb stable  11. Presyncope: Noted in parking lot on way into clinic. Did not pass out. ? 2/2 recent substance use. Monitor on tele while hospitalized. ? Resolved.   May need to consider Palliative Care consult for pain management. Increase gabapentin to tid.   Transfer to [facility]. Ambulate.     I reviewed the LVAD parameters from today, and compared the results to the patient's prior recorded data.  No programming changes were made.  The LVAD is functioning within specified parameters.  The patient performs LVAD self-test daily.  LVAD interrogation was negative for any significant power changes, alarms or PI events/speed drops.  LVAD equipment check completed and is in good working order.  Back-up equipment present.   LVAD education done on emergency procedures and precautions and reviewed exit site care.  Length of Stay: 2  Darrick Grinder, NP 10/02/2022, 8:04 AM  VAD Team --- VAD ISSUES ONLY--- Pager [phone] (7am - 7am)  Advanced Heart Failure Team  Pager [phone] (M-F; 7a - 5p)  Please  contact [facility] for night-coverage after hours (5p -7a ) and weekends on [email or web address]  Patient seen with NP, agree with the above note.   CT of wrist showed no effusion.  Pain and swelling in wrist does seem improved though continues to be present.  He has a splint for his wrist.   General: Well appearing this am. NAD.  HEENT: Normal. Neck: Supple, JVP 7-8 cm. Carotids OK.  Cardiac:  Mechanical heart sounds with LVAD hum present.  Lungs:  CTAB, normal effort.  Abdomen:  NT, ND, no HSM. No bruits or masses. +BS  LVAD exit site: Well-healed and incorporated. Dressing dry and intact. No erythema or drainage. Stabilization device present and accurately applied. Driveline dressing changed daily per sterile technique. Extremities:  Warm and dry. No cyanosis, clubbing, rash, or edema.  Right wrist tender, minimal erythema.  Neuro:  Alert & oriented x 3. Cranial nerves grossly intact. Moves all 4 extremities w/o difficulty. Affect pleasant    With improvement on colchicine and minimal effusion on CT, suspect wrist pain may be gout flare.   - Would continue IV abx today, back to minocycline for driveline infection tomorrow.  - Continue colchicine and allopurinol for gout.  Holding off on prednisone with concern for septic arthritis (though beginning to look more like gout flare).  - Pain control today, hopefully home tomorrow.   Loralie Champagne 10/02/2022 12:13 PM

## 2022-10-03 ENCOUNTER — Other Ambulatory Visit (HOSPITAL_COMMUNITY): Payer: Self-pay

## 2022-10-03 LAB — CBC
HCT: 31.9 % — ABNORMAL LOW (ref 39.0–52.0)
Hemoglobin: 10.9 g/dL — ABNORMAL LOW (ref 13.0–17.0)
MCH: 30.5 pg (ref 26.0–34.0)
MCHC: 34.2 g/dL (ref 30.0–36.0)
MCV: 89.4 fL (ref 80.0–100.0)
Platelets: 238 10*3/uL (ref 150–400)
RBC: 3.57 MIL/uL — ABNORMAL LOW (ref 4.22–5.81)
RDW: 13.8 % (ref 11.5–15.5)
WBC: 7.4 10*3/uL (ref 4.0–10.5)
nRBC: 0 % (ref 0.0–0.2)

## 2022-10-03 LAB — PROTIME-INR
INR: 2.2 — ABNORMAL HIGH (ref 0.8–1.2)
Prothrombin Time: 24.6 seconds — ABNORMAL HIGH (ref 11.4–15.2)

## 2022-10-03 LAB — BASIC METABOLIC PANEL
Anion gap: 11 (ref 5–15)
BUN: 20 mg/dL (ref 8–23)
CO2: 27 mmol/L (ref 22–32)
Calcium: 9 mg/dL (ref 8.9–10.3)
Chloride: 97 mmol/L — ABNORMAL LOW (ref 98–111)
Creatinine, Ser: 0.99 mg/dL (ref 0.61–1.24)
GFR, Estimated: >60 mL/min (ref >60)
Glucose, Bld: 124 mg/dL — ABNORMAL HIGH (ref 70–99)
Potassium: 4.4 mmol/L (ref 3.5–5.1)
Sodium: 135 mmol/L (ref 135–145)

## 2022-10-03 LAB — GLUCOSE, CAPILLARY: Glucose-Capillary: 124 mg/dL — ABNORMAL HIGH (ref 70–99)

## 2022-10-03 LAB — LACTATE DEHYDROGENASE: LDH: 146 U/L (ref 98–192)

## 2022-10-03 MED ORDER — TRAMADOL HCL 50 MG PO TABS
50.0000 mg | ORAL_TABLET | Freq: Two times a day (BID) | ORAL | 1 refills | Status: DC | PRN
  Filled 2022-10-03: qty 30, 15d supply, fill #0

## 2022-10-03 MED ORDER — WARFARIN SODIUM 2 MG PO TABS: 4.0000 mg | ORAL_TABLET | Freq: Once | ORAL | Status: DC

## 2022-10-03 MED ORDER — GABAPENTIN 600 MG PO TABS
600.0000 mg | ORAL_TABLET | Freq: Three times a day (TID) | ORAL | 6 refills | Status: DC
  Filled 2022-10-03: qty 90, 30d supply, fill #0

## 2022-10-03 MED ORDER — COLCHICINE 0.6 MG PO TABS
0.6000 mg | ORAL_TABLET | Freq: Every day | ORAL | 1 refills | Status: DC
  Filled 2022-10-03: qty 30, 30d supply, fill #0

## 2022-10-03 NOTE — Progress Notes (Signed)
ANTICOAGULATION CONSULT NOTE -Follow Up Consult  Pharmacy Consult for Warfarin Indication: VAD  No Known Allergies  Patient Measurements: Height: 5\' 10"  (177.8 cm) Weight: 82 kg (180 lb 12.4 oz) IBW/kg (Calculated) : 73  Vital Signs: Temp: 97.8 F (36.6 C) (10/25 2300) Temp Source: Oral (10/25 2300) BP: 111/90 (10/26 0300) Pulse Rate: 100 (10/26 0300)  Labs: Recent Labs    10/01/22 0532 10/01/22 2055 10/02/22 0530 10/03/22 0603  HGB 10.5*  --  10.8* 10.9*  HCT 29.7*  --  31.2* 31.9*  PLT 205  --  220 238  LABPROT 34.7*  --  28.6* 24.6*  INR 3.5*  --  2.7* 2.2*  CREATININE 1.12 1.17 0.98 0.99     Estimated Creatinine Clearance: 80.9 mL/min (by C-G formula based on SCr of 0.99 mg/dL).   Medical History: Past Medical History:  Diagnosis Date   "    Arthritis    CAD (coronary artery disease)    a. s/p CABG in 11/2019 with LIMA-LAD, SVG-OM1, SVG-PDA and SVG-D1   CHF (congestive heart failure) (HCC)    a. EF < 20% by echo in 11/2019   Essential hypertension    PAF (paroxysmal atrial fibrillation) (HCC)    Type 2 diabetes mellitus (HCC)     Medications:  Scheduled:   allopurinol  100 mg Oral Daily   Chlorhexidine Gluconate Cloth  6 each Topical Daily   colchicine  0.6 mg Oral Daily   gabapentin  600 mg Oral TID   hydrALAZINE  12.5 mg Oral TID   insulin aspart  0-15 Units Subcutaneous TID WC   methimazole  5 mg Oral Q breakfast   metoprolol succinate  25 mg Oral Daily   mupirocin ointment  1 Application Nasal BID   pantoprazole  40 mg Oral Daily   rosuvastatin  20 mg Oral Daily   sertraline  50 mg Oral Daily   tamsulosin  0.4 mg Oral Daily   Warfarin - Pharmacist Dosing Inpatient   Does not apply q1600   zinc sulfate  220 mg Oral Daily   Infusions:   ampicillin-sulbactam (UNASYN) IV 3 g (10/03/22 0518)   vancomycin Stopped (10/02/22 2306)    Assessment: 50 YOM admitted for VAD driveline infection on warfarin prior to admission.   PTA regimen  warfarin  6mg  MWF and 4mg  AOD  INR elevated on admit, now down to 2.2 and therapeutic. No procedures planned. LDH and CBC stable.  Goal of Therapy:  INR 2-2.5 Monitor platelets by anticoagulation protocol: Yes   Plan:  Warfarin 4mg  PO x1 Daily INR   Arrie Senate, PharmD, BCPS, [facility] Clinical Pharmacist [phone] Please check AMION for all [facility] numbers 10/03/2022

## 2022-10-03 NOTE — Progress Notes (Signed)
LVAD Coordinator Rounding Note:  Admitted 09/30/22 to Dr. Claris Gladden service due to right wrist swelling and pain. Started on IV antibiotics.   Pt lying in bed on my arrival. States wrist pain has improved.Redness has resolved. Tissue swelling present.Not using splint.  Plan to discharge home today. Colchicine 0.6 daily and tramadol 50 bid prn added for pain per MD.  CT right wrist:  1. Diffuse soft tissue swelling and subcutaneous fat inflammatory stranding. No walled-off fluid collection is seen to indicate an abscess. 2. Tiny ossicle that appears with a proximal non corticated margin near similar non corticated irregularity of the distal dorsal aspect of the lunate, a possible tiny cortical chip fracture. 3. Moderate to severe osteoarthritis as above, greatest within the index finger and thumb carpometacarpal joints.  Received page overnight for a "back up battery fault" alarm. Alarm resolved itself at 0247 with no further occurrence per VAD interrogation. If alarm occurs again will exchange controller back up battery.   D/c antibiotic plan per ID: Minocycline. VAD clinic f/u appt 10/07/22 at 0830.   Vital signs: Temp:  98.7 HR: 84 Doppler Pressure: 100 prior to AM medications Automatic BP: 132/109 (115) prior to AM medications  O2 Sat: 100% on 2 L [place] Wt: 178.8>183.4>180.8  lbs   LVAD interrogation reveals:  Speed: 5600 Flow: 3.6 Power: 4.1 w PI: 6.8 Alarms: none Events: 100 + PI events Hct: 30  Fixed speed:  5600 Low speed limit:  5300  Drive Line:  Existing VAD dressing removed and site care performed using sterile technique. Drive line exit site cleaned with Chlora prep applicators x 2 and hypochlorous, allowed to dry, and gauze dressing with hypochlorous 2 x 2 applied. Exit site healing and incorporated. Scant yellow drainage. No redness, tenderness, foul odor or rash noted. Drive line anchor re-applied. Will advance to every other day dressing changes. Next dressing change  due 10/05/22 using daily kit by caregiver Myrtle. Pt has adequate dressing supplies at home.   Labs:  LDH trend: 132>140>146  INR trend: 3.5>2.7>2.2  CRP trend: 5.0  Sed rate trend: 28  Anticoagulation Plan: -INR Goal: 2.0 - 2.5 -ASA Dose: none  ICD: N/A  Drips:    Infection:  09/30/22>> blood cultures>> no growth 2 days; final pending  Plan/Recommendations:  Follow up appointment 10/07/22 at 0830  Every other day drive line dressing changes by caregiver Myra Rude RN,BSN [place] Coordinator  Office: [phone]  24/7 Pager: [phone]

## 2022-10-03 NOTE — Progress Notes (Signed)
Pharmacy Antibiotic Note  Kevin Underwood. is a 61 y.o. male admitted on 09/30/2022 with VAD driveline infection.  Pharmacy has been consulted for vancomycin and meropenem dosing.  Historically, he has grown MRSA and acinetobacter baumanii species in driveline cultures. Followed by ID in outpatient setting. Previously on doxycycline long-term therapy with plan to switch to minocycline; however, not admitted and broadening to IV antibiotics  Per ID continue IV antibiotics for now until discharge than plan minocycline again.  Plan: Continue vancomycin 1500mg  q24h Continue Unasyn 3g q6h   Height: 5\' 10"  (177.8 cm) Weight: 82 kg (180 lb 12.4 oz) IBW/kg (Calculated) : 73  Temp (24hrs), Avg:98.3 F (36.8 C), Min:97.8 F (36.6 C), Max:98.7 F (37.1 C)  Recent Labs  Lab 09/29/22 0920 09/30/22 1513 10/01/22 0532 10/01/22 2055 10/02/22 0530 10/03/22 0603  WBC 9.5 8.5 6.8  --  6.3 7.4  CREATININE 1.15 1.09 1.12 1.17 0.98 0.99     Estimated Creatinine Clearance: 80.9 mL/min (by C-G formula based on SCr of 0.99 mg/dL).    No Known Allergies   Thank you for allowing pharmacy to be a part of this patient's care.  Einar Grad, PharmD Clinical Pharmacist 10/03/2022 7:36 AM

## 2022-10-03 NOTE — TOC CM/SW Note (Signed)
HF TOC CM contacted [name], pt dc home with po abx. [place], Heart Failure TOC CM [phone]

## 2022-10-03 NOTE — Progress Notes (Addendum)
Advanced Heart Failure VAD Team Note  PCP-Cardiologist: Rozann Lesches, MD   Subjective:   Admitted r wrist pain/swelling. Started on IV antibiotics.   Bld CX- NGTD CT wrist - no effusion.   Complaining of right wrist pain.   LVAD INTERROGATION:  HeartMate III  LVAD:   Flow liters/min, speed 5600, power 4. , PI 4.2 . 24 PI events    Objective:    Vital Signs:   Temp:  [97.8 F (36.6 C)-98.7 F (37.1 C)] 97.8 F (36.6 C) (10/25 2300) Pulse Rate:  [60-193] 100 (10/26 0300) Resp:  [10-19] 15 (10/26 0300) BP: (71-174)/(58-142) 111/90 (10/26 0300) SpO2:  [74 %-100 %] 85 % (10/25 2100) Weight:  [82 kg-83.2 kg] 82 kg (10/26 0500) Last BM Date : 09/29/22 Mean arterial Pressure 80s    Intake/Output:   Intake/Output Summary (Last 24 hours) at 10/03/2022 0730 Last data filed at 10/03/2022 0400 Gross per 24 hour  Intake 907.81 ml  Output 2150 ml  Net -1242.19 ml     Physical Exam   Physical Exam: GENERAL: No acute distress. HEENT: normal  NECK: Supple, JVP flat  .  2+ bilaterally, no bruits.  No lymphadenopathy or thyromegaly appreciated.   CARDIAC:  Mechanical heart sounds with LVAD hum present.  LUNGS:  Clear to auscultation bilaterally.  ABDOMEN:  Soft, round, nontender, positive bowel sounds x4.     LVAD exit site: well-healed and incorporated.  Dressing dry and intact.  No erythema or drainage.  Stabilization device present and accurately applied.  Driveline dressing is being changed daily per sterile technique. EXTREMITIES:  Warm and dry, no cyanosis, clubbing, rash or edema  NEUROLOGIC:  Alert and oriented x 3.    No aphasia.  No dysarthria.  Affect pleasant.     Telemetry  Sr 80-90s    N Labs   Basic Metabolic Panel: Recent Labs  Lab 09/30/22 1513 10/01/22 0532 10/01/22 2055 10/02/22 0530 10/03/22 0603  NA 138 135 137 135 135  K 3.5 3.5 4.0 4.1 4.4  CL 103 102 101 100 97*  CO2 23 23 25 26 27   GLUCOSE 128* 131* 151* 128* 124*  BUN 19 17 20 17  20   CREATININE 1.09 1.12 1.17 0.98 0.99  CALCIUM 8.8* 8.7* 8.9 9.0 9.0  MG  --   --  1.4* 2.3  --     Liver Function Tests: Recent Labs  Lab 09/29/22 0920 09/30/22 1513  AST 44* 18  ALT 30 22  ALKPHOS 85 78  BILITOT 1.2 0.3  PROT 6.5 6.5  ALBUMIN 3.8 3.3*   No results for input(s): "LIPASE", "AMYLASE" in the last 168 hours. No results for input(s): "AMMONIA" in the last 168 hours.  CBC: Recent Labs  Lab 09/29/22 0920 09/30/22 1513 10/01/22 0532 10/02/22 0530 10/03/22 0603  WBC 9.5 8.5 6.8 6.3 7.4  NEUTROABS 7.2  --   --   --   --   HGB 11.2* 10.6* 10.5* 10.8* 10.9*  HCT 31.1* 30.3* 29.7* 31.2* 31.9*  MCV 86.6 88.1 87.6 88.4 89.4  PLT 196 224 205 220 238    INR: Recent Labs  Lab 09/29/22 0920 09/30/22 1513 10/01/22 0532 10/02/22 0530 10/03/22 0603  INR 4.3* 4.5* 3.5* 2.7* 2.2*    Other results: EKG:    Imaging   CT WRIST RIGHT W CONTRAST  Result Date: 10/01/2022 CLINICAL DATA:  Soft tissue infection suspected. Soft tissue swelling and cellulitis. EXAM: CT OF THE UPPER RIGHT EXTREMITY WITH CONTRAST TECHNIQUE: Multidetector CT  imaging of the upper right extremity was performed according to the standard protocol following intravenous contrast administration. RADIATION DOSE REDUCTION: This exam was performed according to the departmental dose-optimization program which includes automated exposure control, adjustment of the mA and/or kV according to patient size and/or use of iterative reconstruction technique. CONTRAST:  43mL OMNIPAQUE IOHEXOL 350 MG/ML SOLN COMPARISON:  Right wrist radiographs 10/01/2022 and right hand radiographs 08/24/2012 FINDINGS: Bones/Joint/Cartilage There is again a metal presumable surgical staple at the dorsal aspect of the mid shaft of the third metacarpal, presumably postsurgical change. This is unchanged from remote 08/24/2012 radiographs. Severe thumb and index finger carpometacarpal, moderate triscaphe, and mild third through fifth  carpometacarpal joint space narrowing and peripheral osteophytosis. There is a chronic well corticated ossicle measuring up to 5 mm just dorsal to the proximal lateral aspect of the capitate (coronal series 5 image 20 and sagittal series 7, image 21). Just medial to this, there is a slightly smaller, 3 mm ossicle with a proximal margin at appears non corticated. There is also irregularity of the adjacent distal dorsal aspect of the lunate, and this may represent a tiny displaced lunate fracture fragment (sagittal series 7, image 19 and coronal series 5, image 20). No cortical erosion is seen. Ligaments Suboptimally assessed by CT. Muscles and Tendons No gross tendon tear is seen. Decreased definition within the regional musculature, likely muscle edema. Soft tissues There is diffuse soft tissue swelling and subcutaneous fat inflammatory stranding. Moderate to high-grade atherosclerotic calcifications. No walled-off fluid collection is seen to indicate an abscess. IMPRESSION: 1. Diffuse soft tissue swelling and subcutaneous fat inflammatory stranding. No walled-off fluid collection is seen to indicate an abscess. 2. Tiny ossicle that appears with a proximal non corticated margin near similar non corticated irregularity of the distal dorsal aspect of the lunate, a possible tiny cortical chip fracture. 3. Moderate to severe osteoarthritis as above, greatest within the index finger and thumb carpometacarpal joints. Electronically Signed   By: [name] M.D.   On: 10/01/2022 17:23   DG Wrist 2 Views Right  Result Date: 10/01/2022 CLINICAL DATA:  Pain and swelling EXAM: RIGHT WRIST - 2 VIEW COMPARISON:  Right hand done on 08/24/2012 FINDINGS: No recent fracture or dislocation is seen. Degenerative changes are noted with bony spurs in the first carpometacarpal joint. Degenerative changes are also noted in first, second and third metacarpophalangeal joints and the interphalangeal joint of the thumb. Small bony  spurs are noted in the intercarpal joints along the lateral aspect of the wrist. There is metallic wire in the mid shaft of fourth metacarpal with no change. There is interval healing of fracture of shaft of fifth metacarpal. Significant arterial calcifications are seen. IMPRESSION: No fracture or dislocation is seen. Degenerative changes are noted in multiple joints as described in the body of the report. Fairly extensive arterial calcifications are seen suggesting significant arteriosclerosis. Electronically Signed   By: Elmer Picker M.D.   On: 10/01/2022 09:22     Medications:     Scheduled Medications:  allopurinol  100 mg Oral Daily   Chlorhexidine Gluconate Cloth  6 each Topical Daily   colchicine  0.6 mg Oral Daily   gabapentin  600 mg Oral TID   hydrALAZINE  12.5 mg Oral TID   insulin aspart  0-15 Units Subcutaneous TID WC   methimazole  5 mg Oral Q breakfast   metoprolol succinate  25 mg Oral Daily   mupirocin ointment  1 Application Nasal BID   pantoprazole  40 mg Oral Daily   rosuvastatin  20 mg Oral Daily   sertraline  50 mg Oral Daily   tamsulosin  0.4 mg Oral Daily   Warfarin - Pharmacist Dosing Inpatient   Does not apply q1600   zinc sulfate  220 mg Oral Daily    Infusions:  ampicillin-sulbactam (UNASYN) IV 3 g (10/03/22 0518)   vancomycin Stopped (10/02/22 2306)    PRN Medications: acetaminophen, ondansetron (ZOFRAN) IV, oxyCODONE, traMADol    Assessment/Plan:   Right wrist pain/swelling: -? Gout versus septic arthritis given hx of driveline infection and substance abuse. Denies any recent IV drug use.  Uric acid 2.9 CRP 5 Sed rate 28  - Continue allopurinol and started colchicine. Does have hx gout in his feet. - Discussed with ortho.  - Bld Cx- NGTD - Discussed with Hilbert Odor PA-C with ortho.  -CT wrist- no effusion. No need for aspiration.  - Improving but having ongoing pain.   -Continue antibiotics. Continue vanc and Unasyn.  - Continue  removable splint  2.  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  He has completed vancomycin and was on long-term doxycycline. Recently grew acinetobacter and completed course of levofloxacin. Has been back on doxycycline, most recent culture grew acinetobacter and MRSA. ID recommended minocycline.  - Hold minocycline while on vanc and meropenem, will need to add back at discharge. 3.  Chronic systolic CHF: Echo [id number] with EF 20-25%, mildly decreased RV function. LHC/RHC in 12/21 with patent grafts, low output. Suspect mixed ischemic/nonischemic cardiomyopathy (prior heavy ETOH and drugs as well as CAD).  No ETOH, drugs, smoking since CABG in 12/20. Admitted with cardiogenic shock in 12/21, had placement of Impella 5.5 initially, now s/p Heartmate 3 LVAD on 11/17/20.  Ramp echo 2/23 with speed decreased to 5500 rpm. He would eventually like heart transplant. Speed now back to 5600 rpm.  - HF perspective stable.  - Volume status stable.  - Continue current Toprol XL and hydralazine 12.5 TID -  Continue warfarin, INR goal 2-2.5. INR /LDH stable.   4. 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 D/w Renal and feel like he may need higher perfusion pressures. Creatinine has been lower recently.  - Would consider eventual reassessment of renal artery stenosis and possible treatment  - Renl function stable.  5.  CAD: S/p CABG 12/20.  LHC pre-VAD with patent grafts, no target for intervention. - Continue statin.  6.  Atrial fibrillation: Paroxysmal.  S/p Maze and LA appendage clip with CABG in 12/20. -In SR - Continue Toprol XL 25 mg daily.  - Off amiodarone with hyperthyroidism. 7.  Type 2 diabetes: Per PCP. On glipizide and metformin PTA.  - SSI while hospitalized 8. Methamphetamine abuse: Ongoing. Reports he bought hydrocodone off the street that  was laced with methamphetamine a few days ago. - UDS + amphetamine+ opiates.  9. 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  He is now off amiodarone. Followed by endocrinology, recently decreased methimazole.  - Continue methimazole 5 mg daily.  - Had endocrinology followup.   10. Anemia (chronic blood loss): Hgb stable  11. Presyncope: Noted in parking lot on way into clinic. Did not pass out. ? 2/2 recent substance use. Monitor on tele while hospitalized. ? Resolved.    I reviewed the LVAD parameters from today, and compared the results to the patient's prior recorded  data.  No programming changes were made.  The LVAD is functioning within specified parameters.  The patient performs LVAD self-test daily.  LVAD interrogation was negative for any significant power changes, alarms or PI events/speed drops.  LVAD equipment check completed and is in good working order.  Back-up equipment present.   LVAD education done on emergency procedures and precautions and reviewed exit site care.  Length of Stay: 3  Darrick Grinder, NP 10/03/2022, 7:30 AM  VAD Team --- VAD ISSUES ONLY--- Pager [phone] (7am - 7am)  Advanced Heart Failure Team  Pager [phone] (M-F; 7a - 5p)  Please contact [facility] for night-coverage after hours (5p -7a ) and weekends on [email or web address]  Patient seen with NP, agree with the above note.   Right wrist is considerably improved symptomatically and in appearance since admission.  Still complains of pain.  Not using splint.   General: Well appearing this am. NAD.  HEENT: Normal. Neck: Supple, JVP 7-8 cm. Carotids OK.  Cardiac:  Mechanical heart sounds with LVAD hum present.  Lungs:  CTAB, normal effort.  Abdomen:  NT, ND, no HSM. No bruits or masses. +BS  LVAD exit site: Well-healed and incorporated. Dressing dry and intact. No erythema or drainage. Stabilization device present and accurately applied. Driveline dressing changed daily per sterile technique. Extremities:   Warm and dry. No cyanosis, clubbing, rash, or edema. Right wrist with significantly decreased overlying erythema.  Neuro:  Alert & oriented x 3. Cranial nerves grossly intact. Moves all 4 extremities w/o difficulty. Affect pleasant    Initially, we were concerned for septic arthritis but no tappable effusion on CT and improved rapidly.  Suspect this was gout flare.   He can go home on colchicine 0.6 daily and tramadol 50 bid prn.  He will stop IV abx and restart minocycline.  Followup LVAD clinic.  Loralie Champagne 10/03/2022 7:49 AM

## 2022-10-04 ENCOUNTER — Other Ambulatory Visit (HOSPITAL_COMMUNITY): Payer: Self-pay

## 2022-10-04 DIAGNOSIS — T827XXA Infection and inflammatory reaction due to other cardiac and vascular devices, implants and grafts, initial encounter: Secondary | ICD-10-CM

## 2022-10-04 DIAGNOSIS — R55 Syncope and collapse: Secondary | ICD-10-CM

## 2022-10-04 DIAGNOSIS — Z95811 Presence of heart assist device: Secondary | ICD-10-CM

## 2022-10-04 DIAGNOSIS — Z7901 Long term (current) use of anticoagulants: Secondary | ICD-10-CM

## 2022-10-04 LAB — ANA W/REFLEX IF POSITIVE: Anti Nuclear Antibody (ANA): NEGATIVE

## 2022-10-04 LAB — RHEUMATOID FACTOR: Rheumatoid fact SerPl-aCnc: 17.5 IU/mL — ABNORMAL HIGH (ref <14.0)

## 2022-10-05 LAB — CULTURE, BLOOD (ROUTINE X 2)
Culture: NO GROWTH
Culture: NO GROWTH
Special Requests: ADEQUATE
Special Requests: ADEQUATE

## 2022-10-07 ENCOUNTER — Ambulatory Visit (HOSPITAL_COMMUNITY)
Admit: 2022-10-07 | Discharge: 2022-10-07 | Disposition: A | Discharge status: Home / Self Care | Payer: 59 | Attending: Cardiology | Admitting: Cardiology

## 2022-10-07 ENCOUNTER — Ambulatory Visit (HOSPITAL_COMMUNITY): Payer: Self-pay | Admitting: Pharmacist

## 2022-10-07 ENCOUNTER — Encounter (HOSPITAL_COMMUNITY): Payer: Self-pay

## 2022-10-07 ENCOUNTER — Encounter (HOSPITAL_COMMUNITY): Payer: 59

## 2022-10-07 DIAGNOSIS — E0590 Thyrotoxicosis, unspecified without thyrotoxic crisis or storm: Secondary | ICD-10-CM | POA: Insufficient documentation

## 2022-10-07 DIAGNOSIS — Z95811 Presence of heart assist device: Secondary | ICD-10-CM | POA: Diagnosis present

## 2022-10-07 DIAGNOSIS — Z951 Presence of aortocoronary bypass graft: Secondary | ICD-10-CM | POA: Diagnosis not present

## 2022-10-07 DIAGNOSIS — I2510 Atherosclerotic heart disease of native coronary artery without angina pectoris: Secondary | ICD-10-CM | POA: Insufficient documentation

## 2022-10-07 DIAGNOSIS — Y718 Miscellaneous cardiovascular devices associated with adverse incidents, not elsewhere classified: Secondary | ICD-10-CM | POA: Insufficient documentation

## 2022-10-07 DIAGNOSIS — I428 Other cardiomyopathies: Secondary | ICD-10-CM | POA: Diagnosis not present

## 2022-10-07 DIAGNOSIS — M25531 Pain in right wrist: Secondary | ICD-10-CM | POA: Diagnosis not present

## 2022-10-07 DIAGNOSIS — I480 Paroxysmal atrial fibrillation: Secondary | ICD-10-CM | POA: Insufficient documentation

## 2022-10-07 DIAGNOSIS — R55 Syncope and collapse: Secondary | ICD-10-CM | POA: Diagnosis not present

## 2022-10-07 DIAGNOSIS — Y838 Other surgical procedures as the cause of abnormal reaction of the patient, or of later complication, without mention of misadventure at the time of the procedure: Secondary | ICD-10-CM | POA: Insufficient documentation

## 2022-10-07 DIAGNOSIS — F1510 Other stimulant abuse, uncomplicated: Secondary | ICD-10-CM | POA: Insufficient documentation

## 2022-10-07 DIAGNOSIS — T827XXA Infection and inflammatory reaction due to other cardiac and vascular devices, implants and grafts, initial encounter: Secondary | ICD-10-CM | POA: Diagnosis not present

## 2022-10-07 DIAGNOSIS — J449 Chronic obstructive pulmonary disease, unspecified: Secondary | ICD-10-CM | POA: Insufficient documentation

## 2022-10-07 DIAGNOSIS — Z79899 Other long term (current) drug therapy: Secondary | ICD-10-CM | POA: Diagnosis not present

## 2022-10-07 DIAGNOSIS — I130 Hypertensive heart and chronic kidney disease with heart failure and stage 1 through stage 4 chronic kidney disease, or unspecified chronic kidney disease: Secondary | ICD-10-CM | POA: Diagnosis not present

## 2022-10-07 DIAGNOSIS — E1122 Type 2 diabetes mellitus with diabetic chronic kidney disease: Secondary | ICD-10-CM | POA: Insufficient documentation

## 2022-10-07 DIAGNOSIS — Z7901 Long term (current) use of anticoagulants: Secondary | ICD-10-CM

## 2022-10-07 DIAGNOSIS — D500 Iron deficiency anemia secondary to blood loss (chronic): Secondary | ICD-10-CM | POA: Insufficient documentation

## 2022-10-07 DIAGNOSIS — Z792 Long term (current) use of antibiotics: Secondary | ICD-10-CM | POA: Insufficient documentation

## 2022-10-07 DIAGNOSIS — N183 Chronic kidney disease, stage 3 unspecified: Secondary | ICD-10-CM | POA: Diagnosis not present

## 2022-10-07 DIAGNOSIS — I5022 Chronic systolic (congestive) heart failure: Secondary | ICD-10-CM | POA: Diagnosis not present

## 2022-10-07 LAB — BASIC METABOLIC PANEL
Anion gap: 10 (ref 5–15)
BUN: 16 mg/dL (ref 8–23)
CO2: 27 mmol/L (ref 22–32)
Calcium: 9.2 mg/dL (ref 8.9–10.3)
Chloride: 100 mmol/L (ref 98–111)
Creatinine, Ser: 0.95 mg/dL (ref 0.61–1.24)
GFR, Estimated: >60 mL/min (ref >60)
Glucose, Bld: 145 mg/dL — ABNORMAL HIGH (ref 70–99)
Potassium: 3.8 mmol/L (ref 3.5–5.1)
Sodium: 137 mmol/L (ref 135–145)

## 2022-10-07 LAB — CBC
HCT: 31.1 % — ABNORMAL LOW (ref 39.0–52.0)
Hemoglobin: 10.4 g/dL — ABNORMAL LOW (ref 13.0–17.0)
MCH: 30.3 pg (ref 26.0–34.0)
MCHC: 33.4 g/dL (ref 30.0–36.0)
MCV: 90.7 fL (ref 80.0–100.0)
Platelets: 245 10*3/uL (ref 150–400)
RBC: 3.43 MIL/uL — ABNORMAL LOW (ref 4.22–5.81)
RDW: 13.6 % (ref 11.5–15.5)
WBC: 6.3 10*3/uL (ref 4.0–10.5)
nRBC: 0 % (ref 0.0–0.2)

## 2022-10-07 LAB — PROTIME-INR
INR: 1.1 (ref 0.8–1.2)
Prothrombin Time: 14.5 seconds (ref 11.4–15.2)

## 2022-10-07 LAB — MAGNESIUM: Magnesium: 1.7 mg/dL (ref 1.7–2.4)

## 2022-10-07 LAB — LACTATE DEHYDROGENASE: LDH: 145 U/L (ref 98–192)

## 2022-10-07 MED ORDER — ENOXAPARIN SODIUM 40 MG/0.4ML IJ SOSY: 40.0000 mg | PREFILLED_SYRINGE | Freq: Two times a day (BID) | INTRAMUSCULAR | 0 refills | Status: DC

## 2022-10-07 MED ORDER — COLCHICINE 0.6 MG PO TABS: 0.6000 mg | ORAL_TABLET | ORAL | 6 refills | Status: DC | PRN

## 2022-10-07 NOTE — Progress Notes (Addendum)
Patient presents for drive line dressing change today with his wife [name].   Pt states he has been feeling much better since hospital discharge. Right wrist brace in place. Taking Tramadol PRN- per pt this "works ok." Denies lightheadedness, dizziness, falls, heart failure symptoms, and signs of bleeding.   Right wrist edema and redness resolved. Taking Allopurinol and Colchicine for gout pain. Per Dr Aundra Dubin take Colchicine for another week then stop. May take PRN if pain reoccurs.    [name] reports pt has been taking Minocycline and Doxycycline since discharge. Advised to stop Doxycycline, and take only Minocycline. She verbalized understanding.   Vital Signs:  Doppler Pressure: 100 Automatc BP: 130/65 (90) HR: 75 SPO2: UTO    Weight: 195.8 lb w/ equip & heavy boots Last weight: 180 lb w/ ept     VAD Indication: Destination Therapy. HM III implanted 11/17/20 by Dr Orvan Seen   VAD interrogation: Speed: 5600 Flow: 4.3 Power: 4.1 w    PI: 6.2 Hct: 30   Alarms: none  Events: 100+  Fixed speed: 5500 Low speed limit: 5200   Primary controller: replace back up battery in 27 months Back up controller: replace back up battery in 19 months   I reviewed the LVAD parameters from today and compared the results to the patient's prior recorded data. LVAD interrogation was NEGATIVE for significant power changes, NEGATIVE for clinical alarms and STABLE for PI events/speed drops. No programming changes were made and pump is functioning within specified parameters. Pt is performing daily controller and system monitor self tests along with completing weekly and monthly maintenance for LVAD equipment.   LVAD equipment check completed and is in good working order. Back-up equipment not present.    Annual Equipment Maintenance on UBC/PM was performed on 01/09/2022.   Exit Site Care: Existing VAD dressing removed and site care performed using sterile technique. Drive line exit site cleaned with  Chlora prep applicators x 2 and hypochlorous, allowed to dry, and gauze dressing with hypochlorous 2 x 2 applied. Covered with large tegaderm in place of tape. Exit site healing and incorporated. Scant yellow drainage. No redness, tenderness, foul odor or rash noted. Drive line anchor re-applied. Will advance to every 3 days dressing changes. Provided with 7 daily kits, 7 anchors, and a box of large tegaderms for home use.    BP & Labs:  Doppler 100 - Doppler reflecting modified systolic   Hgb 46.6- No S/S of bleeding. Specifically denies melena/BRBPR or nosebleeds. Occasional blood when wiping.    LDH 145 -  with established baseline of 190 - 360. Denies tea-colored urine. No power elevations noted on interrogation.    Plan: Stop Doxycycline. Only taking Minocycline now.  Take Colchicine daily for 1 week, then stop. May take as needed if pain reoccurs.  Coumadin dosing per Lauren PharmD Return to [place] clinic in 2 months for follow up with Dr Aundra Dubin  Return to [place] clinic in 1 week for wound care  Emerson Monte RN [place] Coordinator  Office: [phone]  24/7 Pager: [phone]   HF cardiologist: Dr. Aundra Dubin  Follow up for Heart Failure/LVAD:  61 y.o. 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 type 2 diabetes mellitus, COPD, and hypertension. Now s/p Heartmate 3 LVAD.   Patient had echo in 10/21 with EF 20-25%.  He was admitted in 12/21 with cardiogenic shock.  Cath showed patent grafts and low cardiac output.  Suspect mixed ischemic/nonischemic cardiomyopathy, prior heavy ETOH though no ETOH or smoking since 12/20 CABG.  He required stabilization with Impella 5.5 and ultimately had placement of Heartmate 3 LVAD.  Paroxysmal atrial fibrillation noted post-op, but generally stable course and discharged home.    Patient was  admitted in 10/22 with COPD exacerbation.  Wilder Glade was stopped with frequent PIs.  He was treated with steroids, nebs, antibiotics with improvement.   Ramp echo was done in 2/23.  At 5600 rpm the LV was small and the septum with some left shift, aortic valve opening 3/5 beats.  At 5500 rpm, the LV was larger and septum more midline, aortic valve still opening 3/5 beats.  The RV was moderately enlarged with normal systolic function. I left speed at 5500 rpm.   Patient had a prolonged admission in 6/23 and again in 7/23-8/23 with MRSA driveline infection.  He went to the OR multiple times for debridement and wound vac changes.  He completed vancomycin and was placed on doxycycline.   Levofloxacin added d/t increased drainage and redness after last f/u on 09/27. Wound culture grew acinetobacter.  Wound initially improved but cultures repeated d/t decreased healing over last few dressing changes. Levofloxacin recently stopped and back on doxy.  Grew acinetobacter and MRSA on culture. ICD recommended switching to minocycline 200 BID based on culture results.  Patient was admitted with swollen and erythematous right wrist in 10/23.  There was concern for septic arthritis. CT showed no effusion that could be tapped.  Suspect this was likely a gout flare as it improved rapidly with colchicine.    He returns for followup of LVAD today. Right wrist is much better, minimal pain now. Still wearing splint. He denies dyspnea walking on flat ground, occasionally wheezes.  He is not smoking.  Driveline site looks better. MAP 90.   Labs (10/23): K 4.4, creatinine 0.94, hgb 10.9   Past Medical History:  Diagnosis Date   "    Arthritis    CAD (coronary artery disease)    a. s/p CABG in 11/2019 with LIMA-LAD, SVG-OM1, SVG-PDA and SVG-D1   CHF (congestive heart failure) ([place])    a. EF < 20% by echo in 11/2019   Essential hypertension    PAF (paroxysmal atrial fibrillation) (HCC)    Type 2 diabetes mellitus (HCC)      Current Outpatient Medications  Medication Sig Dispense Refill   albuterol (PROVENTIL) (2.5 MG/3ML) 0.083% nebulizer solution INHALE 3 ML BY NEBULIZATION EVERY 6 HOURS AS NEEDED FOR WHEEZING OR SHORTNESS OF BREATH 450 mL 1   albuterol (VENTOLIN HFA) 108 (90 Base) MCG/ACT inhaler Inhale 2 puffs into the lungs every 6 (six) hours as needed for wheezing or shortness of breath.     allopurinol (ZYLOPRIM) 100 MG tablet Take 1 tablet (100 mg total) by mouth daily. 90 tablet 3   blood glucose meter kit and supplies KIT Dispense based on patient and insurance preference. Use to check CBG's three times a day. (FOR ICD-9 250.00, 250.01). 1 each 0   COMBIVENT RESPIMAT 20-100 MCG/ACT AERS respimat Inhale 1 puff into the lungs every 6 (six) hours.     fluticasone (FLONASE ALLERGY RELIEF) 50 MCG/ACT nasal spray Place 2 sprays into both nostrils as needed for allergies or rhinitis.     fluticasone furoate-vilanterol (BREO ELLIPTA) 100-25 MCG/ACT AEPB Inhale 1 puff into the lungs daily.     gabapentin (NEURONTIN) 600 MG tablet Take 1  tablet (600 mg total) by mouth 3 (three) times daily. 90 tablet 6   guaiFENesin (MUCINEX) 600 MG 12 hr tablet Take 1 tablet (600 mg total) by mouth 2 (two) times daily. 60 tablet 6   hydrALAZINE (APRESOLINE) 25 MG tablet Take 0.5 tablets (12.5 mg total) by mouth 3 (three) times daily. 90 tablet 3   loratadine (CLARITIN) 10 MG tablet Take 1 tablet (10 mg total) by mouth daily. 30 tablet 6   metFORMIN (GLUCOPHAGE) 500 MG tablet Take 1 tablet (500 mg total) by mouth 2 (two) times daily with a meal. 60 tablet 3   methimazole (TAPAZOLE) 5 MG tablet TAKE 1 TABLET BY MOUTH EVERY DAY WITH BREAKFAST (Patient taking differently: Take 5 mg by mouth See admin instructions. Take one tablet (5 mg) by mouth every day with breakfast.) 90 tablet 1   metoprolol succinate (TOPROL XL) 25 MG 24 hr tablet Take 1 tablet (25 mg total) by mouth daily. 30 tablet 6   minocycline (MINOCIN) 100 MG capsule  Take 2 capsules (200 mg total) by mouth 2 (two) times daily. 120 capsule 3   Multiple Vitamin (MULTIVITAMIN WITH MINERALS) TABS tablet Take 1 tablet by mouth daily.     pantoprazole (PROTONIX) 40 MG tablet TAKE 1 TABLET BY MOUTH EVERY DAY (Patient taking differently: Take 40 mg by mouth daily.) 90 tablet 2   rosuvastatin (CRESTOR) 20 MG tablet Take 20 mg by mouth daily.     sertraline (ZOLOFT) 50 MG tablet Take 1 tablet (50 mg total) by mouth daily. 90 tablet 3   tamsulosin (FLOMAX) 0.4 MG CAPS capsule Take 0.4 mg by mouth daily.     Tetrahydrozoline HCl (REDNESS RELIEVER EYE DROPS OP) Apply 2 drops to eye daily as needed.     traMADol (ULTRAM) 50 MG tablet Take 1 tablet (50 mg total) by mouth 2 (two) times daily as needed. 30 tablet 1   traZODone (DESYREL) 150 MG tablet Take 1 tablet (150 mg total) by mouth at bedtime. 90 tablet 3   TRELEGY ELLIPTA 100-62.5-25 MCG/ACT AEPB Inhale 1 puff into the lungs daily.     warfarin (COUMADIN) 4 MG tablet On 10/5 take 4 mg then resume 6 mg (1.5 tabs) on Mon/Wed/Fri and 4 mg (1 tab) all other days or as directed by HF Clinic (Patient taking differently: See admin instructions. Take one and a half tablets (6 mg) M, W, F, and one tablet (4 mg) all other days.) 60 tablet 11   colchicine 0.6 MG tablet Take 1 tablet (0.6 mg total) by mouth as needed. 30 tablet 6   enoxaparin (LOVENOX) 40 MG/0.4ML injection Inject 0.4 mLs (40 mg total) into the skin every 12 (twelve) hours. 4 mL 0   furosemide (LASIX) 40 MG tablet TAKE 0.5 TABLETS (20 MG TOTAL) BY MOUTH DAILY AS NEEDED. (Patient not taking: Reported on 10/07/2022) 45 tablet 3   glipiZIDE (GLUCOTROL) 5 MG tablet Take 1 tablet (5 mg total) by mouth daily. (Patient not taking: Reported on 09/30/2022) 30 tablet 11   No current facility-administered medications for this encounter.    Patient has no known allergies.  REVIEW OF SYSTEMS: All systems negative except as listed in HPI, PMH and Problem list.   LVAD  INTERROGATION:  Please see LVAD nurse's note for interrogation.   I reviewed the LVAD parameters from today, and compared the results to the patient's prior recorded data.  No programming changes were made.  The LVAD is functioning within specified parameters.  The patient  performs LVAD self-test daily.  LVAD interrogation was negative for any significant power changes, alarms or PI events/speed drops.  LVAD equipment check completed and is in good working order.  Back-up equipment present.   LVAD education done on emergency procedures and precautions and reviewed exit site care.    Vitals:   10/07/22 0854 10/07/22 0855  BP: (!) 100/0 130/65  Pulse: 75   Weight: 88.8 kg (195 lb 12.8 oz)    Physical Exam: General: Well appearing this am. NAD.  HEENT: Normal. Neck: Supple, JVP 7-8 cm. Carotids OK.  Cardiac:  Mechanical heart sounds with LVAD hum present.  Lungs:  CTAB, normal effort.  Abdomen:  NT, ND, no HSM. No bruits or masses. +BS  LVAD exit site: Well-healed and incorporated. Dressing dry and intact. No erythema or drainage. Stabilization device present and accurately applied. Driveline dressing changed daily per sterile technique. Extremities:  Warm and dry. No cyanosis, clubbing, rash, or edema. Right wrist much improved, no erythema.  Neuro:  Alert & oriented x 3. Cranial nerves grossly intact. Moves all 4 extremities w/o difficulty. Affect pleasant     ASSESSMENT AND PLAN: Right wrist pain/swelling:  Suspect this was a gout flare.  - Continue colchicine 1 more week then back to prn.  - Continue allopurinol.  2.  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  He has completed vancomycin and was on long-term doxycycline. Most recent culture grew acinetobacter and MRSA. ID recommended minocycline. Driveline site much improved today.  - Continue minocycline.  3.  Chronic systolic CHF: Echo 16/01  with EF 20-25%, mildly decreased RV function. LHC/RHC in 12/21 with patent grafts, low output. Suspect mixed ischemic/nonischemic cardiomyopathy (prior heavy ETOH and drugs as well as CAD).  No ETOH, drugs, smoking since CABG in 12/20. Admitted with cardiogenic shock in 12/21, had placement of Impella 5.5 initially, now s/p Heartmate 3 LVAD on 11/17/20.  Ramp echo 2/23 with speed decreased to 5500 rpm. He would eventually like heart transplant. Speed now back to 5600 rpm.  MAP elevated stable today.    - Continue current Toprol XL and hydralazine 12.5 TID. - He does not need a diuretic.   - Continue warfarin, INR goal 2-2.5.  4. 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 D/w Renal and feel like he may need higher perfusion pressures. Creatinine has been lower recently.  - Would consider eventual reassessment of renal artery stenosis and possible treatment  - BMET today.  5.  CAD: S/p CABG 12/20.  LHC pre-VAD with patent grafts, no target for intervention. - Continue statin.  6.  Atrial fibrillation: Paroxysmal.  S/p Maze and LA appendage clip with CABG in 12/20. Maintaining SR.  - Continue Toprol XL 25 mg daily.  - Off amiodarone with hyperthyroidism. 7.  Type 2 diabetes: Per PCP.  8. 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 Strongly encouraged abstinence.  9. 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  He is now off amiodarone. Followed by endocrinology.  - Continue methimazole  - Endocrinology followup.   10. Anemia (chronic blood loss): CBC today.    Loralie Champagne 10/07/2022

## 2022-10-07 NOTE — Patient Instructions (Addendum)
Stop Doxycycline. Only taking Minocycline now.  Take Colchicine daily for 1 week, then stop. May take as needed if pain reoccurs.  Coumadin dosing per Lauren PharmD Return to [place] clinic in 2 months for follow up with Dr Aundra Dubin.  Return to [place] clinic in 1 week for wound care.

## 2022-10-11 ENCOUNTER — Encounter (HOSPITAL_COMMUNITY): Payer: 59

## 2022-10-14 ENCOUNTER — Other Ambulatory Visit (HOSPITAL_COMMUNITY): Payer: 59

## 2022-10-14 ENCOUNTER — Encounter (HOSPITAL_COMMUNITY): Payer: 59

## 2022-10-14 ENCOUNTER — Ambulatory Visit (HOSPITAL_COMMUNITY): Payer: Self-pay | Admitting: Pharmacist

## 2022-10-14 LAB — POCT INR: INR: 3.7 — AB (ref 2.0–3.0)

## 2022-10-16 ENCOUNTER — Encounter: Payer: Self-pay | Admitting: Internal Medicine

## 2022-10-16 ENCOUNTER — Ambulatory Visit (INDEPENDENT_AMBULATORY_CARE_PROVIDER_SITE_OTHER): Payer: 59 | Admitting: Internal Medicine

## 2022-10-16 ENCOUNTER — Other Ambulatory Visit: Payer: Self-pay

## 2022-10-16 ENCOUNTER — Ambulatory Visit (HOSPITAL_COMMUNITY)
Admission: RE | Admit: 2022-10-16 | Discharge: 2022-10-16 | Disposition: A | Discharge status: Home / Self Care | Payer: 59 | Source: Ambulatory Visit | Attending: Cardiology | Admitting: Cardiology

## 2022-10-16 VITALS — Temp 97.8°F | Ht 70.5 in | Wt 189.0 lb

## 2022-10-16 DIAGNOSIS — Y828 Other medical devices associated with adverse incidents: Secondary | ICD-10-CM | POA: Insufficient documentation

## 2022-10-16 DIAGNOSIS — T827XXA Infection and inflammatory reaction due to other cardiac and vascular devices, implants and grafts, initial encounter: Secondary | ICD-10-CM

## 2022-10-16 DIAGNOSIS — Z95811 Presence of heart assist device: Secondary | ICD-10-CM | POA: Insufficient documentation

## 2022-10-16 DIAGNOSIS — Z7901 Long term (current) use of anticoagulants: Secondary | ICD-10-CM | POA: Diagnosis not present

## 2022-10-16 DIAGNOSIS — Z4801 Encounter for change or removal of surgical wound dressing: Secondary | ICD-10-CM | POA: Insufficient documentation

## 2022-10-16 NOTE — Progress Notes (Signed)
Patient presents for drive line dressing change today with his wife [name]. Wife reports she has been changing dressing every 3 days as drainage has slowed down. She also changes as needed per pt request. She reports patient has been more compliant with wound care instructions.   Pt states that he is taking Minocycline 200 mg BID as instructed. He has ID f/u with Dr Luciana Axe this morning after wound care.   Exit Site Care: Existing VAD dressing removed and site care performed using sterile technique. Drive line exit site cleaned with Chlora prep applicators x 2 and hypochlorous, allowed to dry, and gauze dressing with hypochlorous 2 x 2 applied. Covered with large tegaderm in place of tape. Exit site healing and incorporated. Small amount yellow drainage. No redness, tenderness, foul odor or rash noted. Drive line anchor re-applied. Will continue every 3 days dressing changes. Provided with a box of large tegaderms and cotton tip applicators for home use.     Plan: Return to [facility] clinic in 1 week for wound care with Dr Donata Clay.  ID f/u appt today at 0930 with Dr Luciana Axe.   Alyce Pagan RN VAD Coordinator  Office: [phone]  24/7 Pager: [phone]

## 2022-10-16 NOTE — Progress Notes (Signed)
This patient left without being seen.

## 2022-10-21 ENCOUNTER — Ambulatory Visit (HOSPITAL_COMMUNITY): Payer: Self-pay | Admitting: Pharmacist

## 2022-10-21 LAB — POCT INR: INR: 1.9 — AB (ref 2.0–3.0)

## 2022-10-23 ENCOUNTER — Other Ambulatory Visit (HOSPITAL_COMMUNITY): Payer: 59

## 2022-10-28 ENCOUNTER — Other Ambulatory Visit (HOSPITAL_COMMUNITY): Payer: 59

## 2022-10-29 ENCOUNTER — Encounter (HOSPITAL_COMMUNITY): Payer: Self-pay | Admitting: Unknown Physician Specialty

## 2022-11-05 ENCOUNTER — Ambulatory Visit (HOSPITAL_COMMUNITY): Payer: Self-pay | Admitting: Pharmacist

## 2022-11-05 LAB — POCT INR: INR: 1.4 — AB (ref 2.0–3.0)

## 2022-11-11 ENCOUNTER — Other Ambulatory Visit (HOSPITAL_COMMUNITY): Payer: Self-pay | Admitting: Cardiology

## 2022-11-11 DIAGNOSIS — R39198 Other difficulties with micturition: Secondary | ICD-10-CM

## 2022-11-19 ENCOUNTER — Encounter (HOSPITAL_COMMUNITY): Payer: Self-pay | Admitting: Unknown Physician Specialty

## 2022-11-19 ENCOUNTER — Ambulatory Visit (HOSPITAL_COMMUNITY)
Admission: RE | Admit: 2022-11-19 | Discharge: 2022-11-19 | Disposition: A | Discharge status: Home / Self Care | Payer: 59 | Source: Ambulatory Visit | Attending: Internal Medicine | Admitting: Internal Medicine

## 2022-11-19 DIAGNOSIS — Z4801 Encounter for change or removal of surgical wound dressing: Secondary | ICD-10-CM | POA: Insufficient documentation

## 2022-11-19 DIAGNOSIS — Z95811 Presence of heart assist device: Secondary | ICD-10-CM | POA: Diagnosis not present

## 2022-11-19 NOTE — Progress Notes (Signed)
Patient presents for back up battery fault alarm today alone.   Pt states that he is taking Minocycline 200 mg BID as instructed. He has ID f/u with Dr Linus Salmons this morning after wound care.   Pt tells met his wife is changing his dressing every 3 hrs. VAD coord opted to change dressing today since we haven't seen pt in awhile.   Pt tells me that he was evicted from their apartment for not paying rent. He states that he is living with his mom but he is paying rent at a trailer for [place] and their son. He tells me that the trailor is "a dump" and he refuses to live there. I have updated epic with his moms address and will notify [employer].   Upon interrogation pt has BUB fault alarm with multiple replace back up battery alarms since yesterday afternoon. BUB was replaced with [id number], alarm resolved.   Exit Site Care: Existing VAD dressing removed and site care performed using sterile technique. Drive line exit site cleaned with Chlora prep applicators x 2 and hypochlorous, allowed to dry, and gauze dressing with hypochlorous 2 x 2 applied. Does not tunnel. Covered with gauze and tape. Exit site healing and incorporated. Small amount yellow drainage. Slight redness, but no tenderness, foul odor or rash noted. Drive line anchor re-applied. Will continue every 3 days dressing changes. Provided with 14 daily dressings and 14 anchors for home use.       Plan: Return to [place] clinic in January for 2 yr visit with DR Bensimhon. Pt instructed to bring all equipment to clinic.   [name] RN [place] Coordinator  Office: [phone]  24/7 Pager: [phone]

## 2022-11-20 ENCOUNTER — Ambulatory Visit (HOSPITAL_COMMUNITY): Payer: Self-pay | Admitting: Pharmacist

## 2022-11-20 ENCOUNTER — Telehealth (HOSPITAL_COMMUNITY): Payer: Self-pay | Admitting: Unknown Physician Specialty

## 2022-11-20 LAB — POCT INR: INR: 1.4 — AB (ref 2.0–3.0)

## 2022-11-20 NOTE — Addendum Note (Signed)
Encounter addended by: Lebron Quam, RN on: 11/20/2022 9:41 AM  Actions taken: Clinical Note Signed

## 2022-11-20 NOTE — Progress Notes (Signed)
LVAD INR 

## 2022-11-20 NOTE — Telephone Encounter (Addendum)
Received call from pts wife Traci stating "Kevin Underwood can't pee." Gloris Manchester states that she gave Kevin Underwood 3 Lasix yesterday-morning, noon and night and then 2 Lasix pills today. Traci states that after all the Lasix the pt can still not make urine. Pt denies pain, penile discharge or any other issues. He takes daily Flomax. D/w Dr Shirlee Latch, pt/wife instructed to go to the ER at either [facility] or [facility] for possible bladder/ureter obstruction. Wife states that the pt does not want to go to the ED but states "I will talk to him." Wife states that she will call us and let us know their plan.  Update: pt refuses to go to ED. Dr Shirlee Latch updated.  Carlton Adam RN, BSN VAD Coordinator 24/7 Pager [phone]

## 2022-11-26 ENCOUNTER — Ambulatory Visit (HOSPITAL_COMMUNITY): Payer: Self-pay | Admitting: Pharmacist

## 2022-11-26 LAB — POCT INR: INR: 2.1 (ref 2.0–3.0)

## 2022-12-05 ENCOUNTER — Other Ambulatory Visit: Payer: Self-pay | Admitting: "Endocrinology

## 2022-12-05 DIAGNOSIS — E119 Type 2 diabetes mellitus without complications: Secondary | ICD-10-CM

## 2022-12-12 ENCOUNTER — Other Ambulatory Visit (HOSPITAL_COMMUNITY): Payer: Self-pay

## 2022-12-12 DIAGNOSIS — Z7901 Long term (current) use of anticoagulants: Secondary | ICD-10-CM

## 2022-12-12 DIAGNOSIS — Z95811 Presence of heart assist device: Secondary | ICD-10-CM

## 2022-12-16 ENCOUNTER — Encounter (HOSPITAL_COMMUNITY): Payer: Self-pay | Admitting: *Deleted

## 2022-12-16 ENCOUNTER — Encounter: Payer: Self-pay | Admitting: *Deleted

## 2022-12-16 ENCOUNTER — Encounter (HOSPITAL_COMMUNITY): Payer: 59

## 2022-12-16 ENCOUNTER — Inpatient Hospital Stay: Payer: Self-pay | Admitting: Internal Medicine

## 2022-12-16 ENCOUNTER — Inpatient Hospital Stay: Payer: 59 | Admitting: Internal Medicine

## 2022-12-16 NOTE — Progress Notes (Deleted)
[facility]  CHIEF COMPLAINT:    Follow up for LVAD infection  SUBJECTIVE:    Kevin Underwood. is a 62 y.o. male with PMHx as below who presents to the clinic for LVAD infection.   Patient has followed with ID in the past and saw Dr Luciana Axe for follow up in August 2023.  He has a history of driveline infections with prior cultures having grown MRSA and Acinetobacter.  Patient was scheduled for follow up with Dr Luciana Axe in October 2023 but missed that appointment due to hospitalization for suspected gouty flare of his wrist +/- cellulitis.  He was treated briefly during that admission with IV antibiotics due to initial concern for septic joint but at discharge was placed back on his suppressive minocycline.  He had an appointment with Dr Luciana Axe scheduled 11/8 but left that appt without being seen.  Dr Luciana Axe made this appointment for today back on 11/11/22 for follow up.  Patient reports ***.  Please see A&P for the details of today's visit and status of the patient's medical problems.   Patient's Medications  New Prescriptions   No medications on file  Previous Medications   ALBUTEROL (PROVENTIL) (2.5 MG/3ML) 0.083% NEBULIZER SOLUTION    INHALE 3 ML BY NEBULIZATION EVERY 6 HOURS AS NEEDED FOR WHEEZING OR SHORTNESS OF BREATH   ALBUTEROL (VENTOLIN HFA) 108 (90 BASE) MCG/ACT INHALER    Inhale 2 puffs into the lungs every 6 (six) hours as needed for wheezing or shortness of breath.   ALLOPURINOL (ZYLOPRIM) 100 MG TABLET    Take 1 tablet (100 mg total) by mouth daily.   BLOOD GLUCOSE METER KIT AND SUPPLIES KIT    Dispense based on patient and insurance preference. Use to check CBG's three times a day. (FOR ICD-9 250.00, 250.01).   COLCHICINE 0.6 MG TABLET    Take 1 tablet (0.6 mg total) by mouth as needed.   COMBIVENT RESPIMAT 20-100 MCG/ACT AERS RESPIMAT    Inhale 1 puff into the lungs every 6 (six) hours.   ENOXAPARIN (LOVENOX) 40 MG/0.4ML INJECTION    Inject  0.4 mLs (40 mg total) into the skin every 12 (twelve) hours.   FLUTICASONE (FLONASE ALLERGY RELIEF) 50 MCG/ACT NASAL SPRAY    Place 2 sprays into both nostrils as needed for allergies or rhinitis.   FLUTICASONE FUROATE-VILANTEROL (BREO ELLIPTA) 100-25 MCG/ACT AEPB    Inhale 1 puff into the lungs daily.   FUROSEMIDE (LASIX) 40 MG TABLET    TAKE 0.5 TABLETS (20 MG TOTAL) BY MOUTH DAILY AS NEEDED.   GABAPENTIN (NEURONTIN) 600 MG TABLET    Take 1 tablet (600 mg total) by mouth 3 (three) times daily.   GLIPIZIDE (GLUCOTROL) 5 MG TABLET    Take 1 tablet (5 mg total) by mouth daily.   GUAIFENESIN (MUCINEX) 600 MG 12 HR TABLET    Take 1 tablet (600 mg total) by mouth 2 (two) times daily.   HYDRALAZINE (APRESOLINE) 25 MG TABLET    Take 0.5 tablets (12.5 mg total) by mouth 3 (three) times daily.   LORATADINE (CLARITIN) 10 MG TABLET    Take 1 tablet (10 mg total) by mouth daily.   METFORMIN (GLUCOPHAGE) 500 MG TABLET    TAKE 1 TABLET BY MOUTH 2 TIMES DAILY WITH A MEAL.   METHIMAZOLE (TAPAZOLE) 5 MG TABLET    TAKE 1 TABLET BY MOUTH EVERY DAY WITH BREAKFAST   METOPROLOL SUCCINATE (TOPROL XL) 25  MG 24 HR TABLET    Take 1 tablet (25 mg total) by mouth daily.   MINOCYCLINE (MINOCIN) 100 MG CAPSULE    Take 2 capsules (200 mg total) by mouth 2 (two) times daily.   MULTIPLE VITAMIN (MULTIVITAMIN WITH MINERALS) TABS TABLET    Take 1 tablet by mouth daily.   PANTOPRAZOLE (PROTONIX) 40 MG TABLET    TAKE 1 TABLET BY MOUTH EVERY DAY   ROSUVASTATIN (CRESTOR) 20 MG TABLET    Take 20 mg by mouth daily.   SERTRALINE (ZOLOFT) 50 MG TABLET    Take 1 tablet (50 mg total) by mouth daily.   TAMSULOSIN (FLOMAX) 0.4 MG CAPS CAPSULE    TAKE 1 CAPSULE BY MOUTH EVERY DAY   TETRAHYDROZOLINE HCL (REDNESS RELIEVER EYE DROPS OP)    Apply 2 drops to eye daily as needed.   TRAMADOL (ULTRAM) 50 MG TABLET    Take 1 tablet (50 mg total) by mouth 2 (two) times daily as needed.   TRAZODONE (DESYREL) 150 MG TABLET    Take 1 tablet (150 mg total)  by mouth at bedtime.   TRELEGY ELLIPTA 100-62.5-25 MCG/ACT AEPB    Inhale 1 puff into the lungs daily.   WARFARIN (COUMADIN) 4 MG TABLET    On 10/5 take 4 mg then resume 6 mg (1.5 tabs) on Mon/Wed/Fri and 4 mg (1 tab) all other days or as directed by HF Clinic  Modified Medications   No medications on file  Discontinued Medications   No medications on file      Past Medical History:  Diagnosis Date   "    Arthritis    CAD (coronary artery disease)    a. s/p CABG in 11/2019 with LIMA-LAD, SVG-OM1, SVG-PDA and SVG-D1   CHF (congestive heart failure) (HCC)    a. EF < 20% by echo in 11/2019   Essential hypertension    PAF (paroxysmal atrial fibrillation) (HCC)    Type 2 diabetes mellitus (HCC)     Social History   Tobacco Use   Smoking status: Former    Packs/day: 1.00    Years: 30.00    Total pack years: 30.00    Types: Cigarettes    Quit date: 12/20/2019    Years since quitting: 2.9   Smokeless tobacco: Never  Vaping Use   Vaping Use: Never used  Substance Use Topics   Alcohol use: Not Currently    Comment: Prior history of excessive intake   Drug use: No    Family History  Problem Relation Age of Onset   Heart disease Sister        Tumor?   Arthritis Other    Lung disease Other    Asthma Other    Diabetes Other    Thyroid disease Neg Hx     No Known Allergies  ROS   OBJECTIVE:    There were no vitals filed for this visit. There is no height or weight on file to calculate BMI.  Physical Exam   Labs and Microbiology:    Latest Ref Rng & Units 10/07/2022    8:56 AM 10/03/2022    6:03 AM 10/02/2022    5:30 AM  CBC  WBC 4.0 - 10.5 K/uL 6.3  7.4  6.3   Hemoglobin 13.0 - 17.0 g/dL 56.3  87.5  64.3   Hematocrit 39.0 - 52.0 % 31.1  31.9  31.2   Platelets 150 - 400 K/uL 245  238  220  Latest Ref Rng & Units 10/07/2022    8:56 AM 10/03/2022    6:03 AM 10/02/2022    5:30 AM  CMP  Glucose 70 - 99 mg/dL 145  124  128   BUN 8 - 23 mg/dL 16  20   17    Creatinine 0.61 - 1.24 mg/dL 0.95  0.99  0.98   Sodium 135 - 145 mmol/L 137  135  135   Potassium 3.5 - 5.1 mmol/L 3.8  4.4  4.1   Chloride 98 - 111 mmol/L 100  97  100   CO2 22 - 32 mmol/L 27  27  26    Calcium 8.9 - 10.3 mg/dL 9.2  9.0  9.0      No results found for this or any previous visit (from the past 240 hour(s)).  Imaging: ***   ASSESSMENT & PLAN:    No problem-specific Assessment & Plan notes found for this encounter.   No orders of the defined types were placed in this encounter.    There are no diagnoses linked to this encounter.  ***   Raynelle Highland for Infectious [facility] 12/16/2022, 5:47 AM

## 2022-12-17 ENCOUNTER — Telehealth (HOSPITAL_COMMUNITY): Payer: Self-pay | Admitting: Pharmacist

## 2022-12-17 NOTE — Telephone Encounter (Signed)
Patient is overdue to check his INR on his home machine. Have called multiple times and left VMs asking for them to check. Will inform LVAD coordinators so they are aware he is missing appointments.   Audry Riles, PharmD, BCPS, BCCP, CPP Heart Failure Clinic Pharmacist [phone]

## 2022-12-31 ENCOUNTER — Encounter (HOSPITAL_COMMUNITY): Payer: Self-pay | Admitting: *Deleted

## 2023-01-13 NOTE — Progress Notes (Signed)
Patient called VAD Clinic stating his MPU is constantly alarming and he has been primarily on batteries. Patient instructed to change the 3 AA batteries in bottom of MPU and if it continues to alarm to call the [facility]. Patient has appointment scheduled this upcoming Wednesday with annual maintenence. Reminded to bring home equipment. Patient verbalizes understanding.  Bobbye Morton RN, BSN VAD Coordinator 24/7 Pager [phone]

## 2023-01-15 ENCOUNTER — Ambulatory Visit (HOSPITAL_COMMUNITY)
Admission: RE | Admit: 2023-01-15 | Discharge: 2023-01-15 | Disposition: A | Discharge status: Home / Self Care | Payer: 59 | Source: Ambulatory Visit | Attending: Cardiology | Admitting: Cardiology

## 2023-01-15 ENCOUNTER — Ambulatory Visit (HOSPITAL_COMMUNITY): Payer: Self-pay | Admitting: Pharmacist

## 2023-01-15 ENCOUNTER — Encounter (HOSPITAL_COMMUNITY): Payer: Self-pay | Admitting: Cardiology

## 2023-01-15 VITALS — BP 135/98 | HR 100 | Wt 203.4 lb

## 2023-01-15 DIAGNOSIS — I130 Hypertensive heart and chronic kidney disease with heart failure and stage 1 through stage 4 chronic kidney disease, or unspecified chronic kidney disease: Secondary | ICD-10-CM | POA: Insufficient documentation

## 2023-01-15 DIAGNOSIS — T827XXA Infection and inflammatory reaction due to other cardiac and vascular devices, implants and grafts, initial encounter: Secondary | ICD-10-CM | POA: Diagnosis not present

## 2023-01-15 DIAGNOSIS — Z951 Presence of aortocoronary bypass graft: Secondary | ICD-10-CM | POA: Diagnosis not present

## 2023-01-15 DIAGNOSIS — M25531 Pain in right wrist: Secondary | ICD-10-CM

## 2023-01-15 DIAGNOSIS — N183 Chronic kidney disease, stage 3 unspecified: Secondary | ICD-10-CM | POA: Diagnosis not present

## 2023-01-15 DIAGNOSIS — E1122 Type 2 diabetes mellitus with diabetic chronic kidney disease: Secondary | ICD-10-CM | POA: Diagnosis not present

## 2023-01-15 DIAGNOSIS — M109 Gout, unspecified: Secondary | ICD-10-CM | POA: Diagnosis present

## 2023-01-15 DIAGNOSIS — Z87891 Personal history of nicotine dependence: Secondary | ICD-10-CM | POA: Diagnosis not present

## 2023-01-15 DIAGNOSIS — R7989 Other specified abnormal findings of blood chemistry: Secondary | ICD-10-CM

## 2023-01-15 DIAGNOSIS — M79672 Pain in left foot: Secondary | ICD-10-CM | POA: Insufficient documentation

## 2023-01-15 DIAGNOSIS — Z79899 Other long term (current) drug therapy: Secondary | ICD-10-CM | POA: Insufficient documentation

## 2023-01-15 DIAGNOSIS — D500 Iron deficiency anemia secondary to blood loss (chronic): Secondary | ICD-10-CM | POA: Diagnosis not present

## 2023-01-15 DIAGNOSIS — E0590 Thyrotoxicosis, unspecified without thyrotoxic crisis or storm: Secondary | ICD-10-CM | POA: Insufficient documentation

## 2023-01-15 DIAGNOSIS — Z7901 Long term (current) use of anticoagulants: Secondary | ICD-10-CM | POA: Insufficient documentation

## 2023-01-15 DIAGNOSIS — I480 Paroxysmal atrial fibrillation: Secondary | ICD-10-CM | POA: Diagnosis not present

## 2023-01-15 DIAGNOSIS — Z95811 Presence of heart assist device: Secondary | ICD-10-CM | POA: Diagnosis not present

## 2023-01-15 DIAGNOSIS — J449 Chronic obstructive pulmonary disease, unspecified: Secondary | ICD-10-CM

## 2023-01-15 DIAGNOSIS — J441 Chronic obstructive pulmonary disease with (acute) exacerbation: Secondary | ICD-10-CM

## 2023-01-15 DIAGNOSIS — R600 Localized edema: Secondary | ICD-10-CM

## 2023-01-15 DIAGNOSIS — F1510 Other stimulant abuse, uncomplicated: Secondary | ICD-10-CM | POA: Insufficient documentation

## 2023-01-15 DIAGNOSIS — I5022 Chronic systolic (congestive) heart failure: Secondary | ICD-10-CM | POA: Diagnosis not present

## 2023-01-15 DIAGNOSIS — I2510 Atherosclerotic heart disease of native coronary artery without angina pectoris: Secondary | ICD-10-CM | POA: Diagnosis not present

## 2023-01-15 DIAGNOSIS — M25431 Effusion, right wrist: Secondary | ICD-10-CM

## 2023-01-15 LAB — CBC
HCT: 31 % — ABNORMAL LOW (ref 39.0–52.0)
Hemoglobin: 10.7 g/dL — ABNORMAL LOW (ref 13.0–17.0)
MCH: 29.2 pg (ref 26.0–34.0)
MCHC: 34.5 g/dL (ref 30.0–36.0)
MCV: 84.5 fL (ref 80.0–100.0)
Platelets: 203 10*3/uL (ref 150–400)
RBC: 3.67 MIL/uL — ABNORMAL LOW (ref 4.22–5.81)
RDW: 13.6 % (ref 11.5–15.5)
WBC: 7.1 10*3/uL (ref 4.0–10.5)
nRBC: 0 % (ref 0.0–0.2)

## 2023-01-15 LAB — COMPREHENSIVE METABOLIC PANEL
ALT: 16 U/L (ref 0–44)
AST: 19 U/L (ref 15–41)
Albumin: 3.2 g/dL — ABNORMAL LOW (ref 3.5–5.0)
Alkaline Phosphatase: 100 U/L (ref 38–126)
Anion gap: 10 (ref 5–15)
BUN: 33 mg/dL — ABNORMAL HIGH (ref 8–23)
CO2: 23 mmol/L (ref 22–32)
Calcium: 9.1 mg/dL (ref 8.9–10.3)
Chloride: 101 mmol/L (ref 98–111)
Creatinine, Ser: 1.42 mg/dL — ABNORMAL HIGH (ref 0.61–1.24)
GFR, Estimated: 56 mL/min — ABNORMAL LOW (ref >60)
Glucose, Bld: 165 mg/dL — ABNORMAL HIGH (ref 70–99)
Potassium: 3.7 mmol/L (ref 3.5–5.1)
Sodium: 134 mmol/L — ABNORMAL LOW (ref 135–145)
Total Bilirubin: 0.3 mg/dL (ref 0.3–1.2)
Total Protein: 6.5 g/dL (ref 6.5–8.1)

## 2023-01-15 LAB — T4, FREE: Free T4: 0.89 ng/dL (ref 0.61–1.12)

## 2023-01-15 LAB — PROTIME-INR
INR: 1.6 — ABNORMAL HIGH (ref 0.8–1.2)
Prothrombin Time: 18.5 seconds — ABNORMAL HIGH (ref 11.4–15.2)

## 2023-01-15 LAB — LACTATE DEHYDROGENASE: LDH: 174 U/L (ref 98–192)

## 2023-01-15 LAB — TSH: TSH: 11.044 u[IU]/mL — ABNORMAL HIGH (ref 0.350–4.500)

## 2023-01-15 MED ORDER — ENTRESTO 24-26 MG PO TABS: 1.0000 | ORAL_TABLET | Freq: Every day | ORAL | 3 refills | Status: DC

## 2023-01-15 MED ORDER — FUROSEMIDE 40 MG PO TABS: 20.0000 mg | ORAL_TABLET | Freq: Every day | ORAL | 3 refills | Status: DC | PRN

## 2023-01-15 NOTE — Progress Notes (Addendum)
Patient presents for 3 month follow up with wife [name]. Denies lightheadedness, dizziness, falls and signs of bleeding. Patient states he has increase shortness of breath requiring nebulizer treatments every 4 hour along with rescue inhaler use. Will refer to pulmonology per Dr. Shirlee Latch and obtain PFT's.  He complains of pain in his feet with no reported relief from over the counter medications or Tramadol in the past. Pt encouraged to follow up with PCP about arthritic pain. Dr Shirlee Latch made aware. Order placed for ABI's per Dr. Shirlee Latch.    Pt hypertensive today. Per Dr. Shirlee Latch to start [facility] 24/26 once daily. Standing order for BMET placed to be collected at [facility] for patient convenience next week per Dr. Shirlee Latch.   Patient lost follow up with Endocrinology due to no showing lab appointment. Spoke with Endocrinology office today to appropriately schedule follow up appointment patient needs labs collected. Office can not send standing order for labs to be collected at [facility]. VAD Coordinator given permission to place order for labs in conjunction with BMET next week at [facility] to ensure appropriate labs are collected prior to follow up by [facility]ocr[facility]. VAD Coordinator contacted [name], patient's wife, to make aware of additional blood work and need to schedule follow up appointment with Endocrinology.  Creatinine elevated today patient instructed to HOLD PRN Lasix until otherwise instructed by Dr.Jonisha Kindig.  Pt states MPU continues to alarm when he is hooked up at night. Pt states he changed the batteries in the bottom of the MPU and it worked briefly and then alarm resumed. No alarms noted when interrogating device. [employer] administrator did annual maintenance on equipment and found no issue with MPU. Pt hooked up to MPU to try and replicate alarm with no success. Pt adamant device continues to alarm at night and " when he moves his controller a certain way it stops". Pt given loaner  MPU and we will order a new one that can be picked up at next appointment. Pt asked to please bring loaner MPU back to clinic at next appointment.  Vital Signs:  Doppler Pressure: 120 Automatc BP: 135/98 (118) HR: 100 SPO2: 98    Weight: 203.4 lb w/o ept Last weight: 195.8 lb w/ ept   VAD Indication: Destination Therapy. HM III implanted 11/17/20 by Dr Vickey Sages   VAD interrogation: Speed: 5600 Flow: 4.5 Power: 4.2 w    PI: 3.6 Hct: 30  Alarms: none  Events: 100+  Fixed speed: 5500 Low speed limit: 5200  Primary controller: replace back up battery in 25 months Back up controller: replace back up battery in 19 months   I reviewed the LVAD parameters from today and compared the results to the patient's prior recorded data. LVAD interrogation was NEGATIVE for significant power changes, NEGATIVE for clinical alarms and STABLE for PI events/speed drops. No programming changes were made and pump is functioning within specified parameters. Pt is performing daily controller and system monitor self tests along with completing weekly and monthly maintenance for LVAD equipment.   LVAD equipment check completed and is in good working order. Back-up equipment not present.    Annual Equipment Maintenance on UBC/PM was performed today on 01/15/2023.  [place] Cardiomyopathy Questionnaire     01/15/2023    4:35 PM 12/04/2021    1:27 PM 05/03/2021    2:22 PM  KCCQ-12  1 a. Ability to shower/bathe Slightly limited Not at all limited Not at all limited  1 b. Ability to walk 1 block  Moderately limited Not at all limited Not at all limited  1 c. Ability to hurry/jog Extremely limited Other, Did not do Extremely limited  2. Edema feet/ankles/legs Every morning Never over the past 2 weeks Less than once a week  3. Limited by fatigue At least once a day 1-2 times a week All of the time  4. Limited by dyspnea 3+ times a week, not every day 1-2 times a week Several times a day  5. Sitting up / on  3+ pillows Every night Less than once a week 3+ times a week, not every day  6. Limited enjoyment of life Limited quite a bit Limited quite a bit Limited quite a bit  7. Rest of life w/ symptoms Mostly dissatisfied Somewhat satisfied Not at all satisfied  8 a. Participation in hobbies Limited quite a bit Moderately limited Limited quite a bit  8 b. Participation in chores Limited quite a bit Did not limit at all Moderately limited  8 c. Visiting family/friends Limited quite a bit Slightly limited Slightly limited    Patient refused 6 minute walk.  Neurocognitive trail making completed correctly in 3 m 39 s.   [address] Cardiomyopathy, EQ-5D-3L and post-VAD QOL completed by The patient independently.  Exit Site Care: Existing VAD dressing removed and site care performed using sterile technique by Dr Darcey Nora. Drive line exit site cleaned with Chlora prep applicators x 2, allowed to dry. Hydrochlorous wound spray spray in wound bed and lightly debrided with a 2x2 by MD. Would cultures collected from site. Wound packed with gauze soaked in hypochlorous solution following dry gauze. Exit site healing. Redness noted around wound bed.Wound has scant tan drainage. No tenderness, foul odor or rash noted. Large Tegaderm placed over dressing to ensure adherence to skin.  Drive line anchor re-applied. Pt denies fever or chills. Patient given 14 daily kits, 14 anchors, Vashe wound solution and large tegaderms for at home use.       BP & Labs:  Doppler 120- Doppler reflecting MAP   Hgb 10.7 - No S/S of bleeding. Specifically denies melena/BRBPR or nosebleeds. Occasional blood when wiping.    LDH 174 -  with established baseline of 190 - 360. Denies tea-colored urine. No power elevations noted on interrogation.    Plan: Start taking Entresto once daily Do not resume Lasix until instructed by [place] BMET and Endocrinology labs in a week at [facility] Will send referral to Pulmonologist Please  reach out to [facility] Endocrinology for follow up Will schedule PFT's and lower extremity ultrasound  Bobbye Morton RN,BSN [place] Coordinator  Office: [phone]  24/7 Pager: [phone]   HF cardiologist: Dr. Aundra Dubin  Follow up for Heart Failure/LVAD:  62 y.o. 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 type 2 diabetes mellitus, COPD, and hypertension. Now s/p Heartmate 3 LVAD.   Patient had echo in 10/21 with EF 20-25%.  He was admitted in 12/21 with cardiogenic shock.  Cath showed patent grafts and low cardiac output.  Suspect mixed ischemic/nonischemic cardiomyopathy, prior heavy ETOH though no ETOH or smoking since 12/20 CABG.  He required stabilization with Impella 5.5 and ultimately had placement of Heartmate 3 LVAD.  Paroxysmal atrial fibrillation noted post-op, but generally stable course and discharged home.    Patient was admitted in 10/22 with COPD exacerbation.  Wilder Glade was stopped with frequent PIs.  He was treated with steroids, nebs, antibiotics with improvement.   Ramp echo was done in 2/23.  At 5600 rpm the LV was small and the septum with some left shift, aortic valve opening 3/5 beats.  At 5500 rpm, the LV was larger and septum more midline, aortic valve still opening 3/5 beats.  The RV was moderately enlarged with normal systolic function. I left speed at 5500 rpm.   Patient had a prolonged admission in 6/23 and again in 7/23-8/23 with MRSA driveline infection.  He went to the OR multiple times for debridement and wound vac changes.  He completed vancomycin and was placed on doxycycline.   Levofloxacin added d/t increased drainage and redness after last f/u on 09/27. Wound culture grew acinetobacter.  Wound initially improved but cultures repeated d/t decreased healing over last few dressing changes.  Levofloxacin recently stopped and back on doxy.  Grew acinetobacter and MRSA on culture. ICD recommended switching to minocycline 200 BID based on culture results.  Patient was admitted with swollen and erythematous right wrist in 10/23.  There was concern for septic arthritis. CT showed no effusion that could be tapped.  Suspect this was likely a gout flare as it improved rapidly with colchicine.    He returns for followup of LVAD today. Minimal driveline drainage now.  He is not smoking but he has been wheezing recently.  He has noted some dyspnea if he walks a long distance.  He has chronic left foot pain that limits his ambulation.  Weight is up 7 lbs.  MAP elevated today at 118.   Labs (10/23): K 4.4, creatinine 0.94, hgb 10.9, LDH 145   Past Medical History:  Diagnosis Date   "    Arthritis    CAD (coronary artery disease)    a. s/p CABG in 11/2019 with LIMA-LAD, SVG-OM1, SVG-PDA and SVG-D1   CHF (congestive heart failure) (HCC)    a. EF < 20% by echo in 11/2019   Essential hypertension    PAF (paroxysmal atrial fibrillation) (HCC)    Type 2 diabetes mellitus (HCC)     Current Outpatient Medications  Medication Sig Dispense Refill   albuterol (PROVENTIL) (2.5 MG/3ML) 0.083% nebulizer solution INHALE 3 ML BY NEBULIZATION EVERY 6 HOURS AS NEEDED FOR WHEEZING OR SHORTNESS OF BREATH 450 mL 1   albuterol (VENTOLIN HFA) 108 (90 Base) MCG/ACT inhaler Inhale 2 puffs into the lungs every 6 (six) hours as needed for wheezing or shortness of breath.     blood glucose meter kit and supplies KIT Dispense based on patient and insurance preference. Use to check CBG's three times a day. (FOR ICD-9 250.00, 250.01). 1 each 0   colchicine 0.6 MG tablet Take 1 tablet (0.6 mg total) by mouth as needed. 30 tablet 6   COMBIVENT RESPIMAT 20-100 MCG/ACT AERS respimat Inhale 1 puff into the lungs every 6 (six) hours.     fluticasone (FLONASE ALLERGY RELIEF) 50 MCG/ACT nasal spray Place 2 sprays into both  nostrils as needed for allergies or rhinitis.     fluticasone furoate-vilanterol (BREO ELLIPTA) 100-25 MCG/ACT AEPB Inhale 1 puff into the lungs daily.     gabapentin (NEURONTIN) 600 MG tablet Take 1 tablet (600 mg total) by mouth 3 (three) times daily. 90 tablet 6   glipiZIDE (GLUCOTROL) 5 MG tablet Take 1 tablet (5 mg total) by mouth daily. 30 tablet 11   guaiFENesin (MUCINEX) 600 MG 12 hr tablet Take 1 tablet (600 mg total) by mouth 2 (two)  times daily. 60 tablet 6   hydrALAZINE (APRESOLINE) 25 MG tablet Take 0.5 tablets (12.5 mg total) by mouth 3 (three) times daily. 90 tablet 3   loratadine (CLARITIN) 10 MG tablet Take 1 tablet (10 mg total) by mouth daily. 30 tablet 6   metFORMIN (GLUCOPHAGE) 500 MG tablet TAKE 1 TABLET BY MOUTH 2 TIMES DAILY WITH A MEAL. 180 tablet 1   methimazole (TAPAZOLE) 5 MG tablet TAKE 1 TABLET BY MOUTH EVERY DAY WITH BREAKFAST (Patient taking differently: Take 5 mg by mouth See admin instructions. Take one tablet (5 mg) by mouth every day with breakfast.) 90 tablet 1   metoprolol succinate (TOPROL XL) 25 MG 24 hr tablet Take 1 tablet (25 mg total) by mouth daily. 30 tablet 6   minocycline (MINOCIN) 100 MG capsule Take 2 capsules (200 mg total) by mouth 2 (two) times daily. 120 capsule 3   Multiple Vitamin (MULTIVITAMIN WITH MINERALS) TABS tablet Take 1 tablet by mouth daily.     pantoprazole (PROTONIX) 40 MG tablet TAKE 1 TABLET BY MOUTH EVERY DAY (Patient taking differently: Take 40 mg by mouth daily.) 90 tablet 2   rosuvastatin (CRESTOR) 20 MG tablet Take 20 mg by mouth daily.     sacubitril-valsartan (ENTRESTO) 24-26 MG Take 1 tablet by mouth daily. 60 tablet 3   sertraline (ZOLOFT) 50 MG tablet Take 1 tablet (50 mg total) by mouth daily. 90 tablet 3   tamsulosin (FLOMAX) 0.4 MG CAPS capsule TAKE 1 CAPSULE BY MOUTH EVERY DAY 90 capsule 3   Tetrahydrozoline HCl (REDNESS RELIEVER EYE DROPS OP) Apply 2 drops to eye daily as needed.     traZODone (DESYREL) 150 MG  tablet Take 1 tablet (150 mg total) by mouth at bedtime. 90 tablet 3   TRELEGY ELLIPTA 100-62.5-25 MCG/ACT AEPB Inhale 1 puff into the lungs daily.     warfarin (COUMADIN) 4 MG tablet On 10/5 take 4 mg then resume 6 mg (1.5 tabs) on Mon/Wed/Fri and 4 mg (1 tab) all other days or as directed by HF Clinic (Patient taking differently: See admin instructions. Take one and a half tablets (6 mg) M, W, F, and one tablet (4 mg) all other days.) 60 tablet 11   allopurinol (ZYLOPRIM) 100 MG tablet Take 1 tablet (100 mg total) by mouth daily. (Patient not taking: Reported on 01/15/2023) 90 tablet 3   enoxaparin (LOVENOX) 40 MG/0.4ML injection Inject 0.4 mLs (40 mg total) into the skin every 12 (twelve) hours. (Patient not taking: Reported on 01/15/2023) 4 mL 0   furosemide (LASIX) 40 MG tablet Take 0.5 tablets (20 mg total) by mouth daily as needed. 45 tablet 3   traMADol (ULTRAM) 50 MG tablet Take 1 tablet (50 mg total) by mouth 2 (two) times daily as needed. (Patient not taking: Reported on 01/15/2023) 30 tablet 1   No current facility-administered medications for this encounter.    Patient has no known allergies.  REVIEW OF SYSTEMS: All systems negative except as listed in HPI, PMH and Problem list.   LVAD INTERROGATION:  Please see LVAD nurse's note for interrogation.   I reviewed the LVAD parameters from today, and compared the results to the patient's prior recorded data.  No programming changes were made.  The LVAD is functioning within specified parameters.  The patient performs LVAD self-test daily.  LVAD interrogation was negative for any significant power changes, alarms or PI events/speed drops.  LVAD equipment check completed and is in good working order.  Back-up equipment  present.   LVAD education done on emergency procedures and precautions and reviewed exit site care.    Vitals:   01/15/23 1551 01/15/23 1552  BP: (!) 120/0 (!) 135/98  Pulse: 100   SpO2: 98%   Weight: 92.3 kg (203 lb 6.4  oz)   MAP 118  Physical Exam: General: Well appearing this am. NAD.  HEENT: Normal. Neck: Supple, JVP 7-8 cm. Carotids OK.  Cardiac:  Mechanical heart sounds with LVAD hum present.  Lungs:  Diffuse wheezing bilaterally.   Abdomen:  NT, ND, no HSM. No bruits or masses. +BS  LVAD exit site: Well-healed and incorporated. Dressing dry and intact. No erythema or drainage. Stabilization device present and accurately applied. Driveline dressing changed daily per sterile technique. Extremities:  Warm and dry. No cyanosis, clubbing, rash, or edema.  Neuro:  Alert & oriented x 3. Cranial nerves grossly intact. Moves all 4 extremities w/o difficulty. Affect pleasant     ASSESSMENT AND PLAN: 1.  Gout: Continue allopurinol, he has prn colchicine.  2.  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  He has completed vancomycin and was on long-term doxycycline. Most recent culture grew acinetobacter and MRSA. ID recommended minocycline. Driveline site stable, examined by Dr. Prescott Gum and not thought to need debridement.  - Culture sent of driveline drainage (now minimal).   - Continue minocycline.  3.  Chronic systolic CHF: Echo 94/85 with EF 20-25%, mildly decreased RV function. LHC/RHC in 12/21 with patent grafts, low output. Suspect mixed ischemic/nonischemic cardiomyopathy (prior heavy ETOH and drugs as well as CAD).  No ETOH, drugs, smoking since CABG in 12/20. Admitted with cardiogenic shock in 12/21, had placement of Impella 5.5 initially, now s/p Heartmate 3 LVAD on 11/17/20.  Ramp echo 2/23 with speed decreased to 5500 rpm. He would eventually like heart transplant. Speed now back to 5600 rpm.  MAP elevated today.  He is not volume overloaded on exam, wheezes likely due to COPD.    - Continue current Toprol XL 25 mg daily.  - Add Entresto 24/26 bid. BMET today and in 10 days.  - He does not need a diuretic.   -  Continue warfarin, INR goal 2-2.5.  4. 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  - Would consider eventual reassessment of renal artery stenosis and possible treatment  - BMET today.  5.  CAD: S/p CABG 12/20.  LHC pre-VAD with patent grafts, no target for intervention. - Continue statin.  6.  Atrial fibrillation: Paroxysmal.  S/p Maze and LA appendage clip with CABG in 12/20. Maintaining SR.  - Continue Toprol XL 25 mg daily.  - Off amiodarone with hyperthyroidism. 7. Type 2 diabetes: Per PCP.  8. 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 Strongly encouraged abstinence.  9. 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  He is now off amiodarone. Followed by endocrinology.  - Continue methimazole  - Endocrinology followup.   - Check TSH today.  10. Anemia (chronic blood loss): CBC today.   11. COPD: Prior smoker.  Significant wheezing on exam.  - Continue Trelegy.  - Will arrange for PFTs.  - Needs followup with pulmonary, will have him see  pulmonary in [place].  12. Left foot pain: I will arrange for ABIs.   Loralie Champagne 01/15/2023

## 2023-01-15 NOTE — Patient Instructions (Addendum)
Start taking Entresto once daily Resume Lasix 20mg  as needed for swelling BMET in 01/27/23 at [facility]  Will send referral to Pulmonologist Will reach out to Dr. Dorris Fetch about Endocrinology follow up Will schedule PFT's and lower extremity ultrasound

## 2023-01-16 ENCOUNTER — Encounter (HOSPITAL_COMMUNITY): Payer: Self-pay

## 2023-01-16 NOTE — Progress Notes (Signed)
VAD Coordinator attempted to call patient multiple time in regards to upcoming appointment. Patient has moved and is no longer at address listed in EPIC. Patient's mother called to relay message for patient to call [facility] to speak about upcoming appointments. Information listed below. Will may letter once we receive accurate address. Voicemail left on phone number listed in chart with appointment information and asked to call back to confirm.  The following instructions listed below:  Please go to [facility] to have BMET and Endocrinology labs drawn next week.   Please call [facility] to schedule follow up with Dr. Dorris Fetch [phone]  A referral has been placed for [facility] Pulmonology please look out for a phone call to schedule an appointment  Wednesday January 22, 2023 2:00 pm for Pulmonary Function Test Please arrive at [facility] to check in with ADMISSIONS on 2nd floor near [employer] Arrive 15 min before your appointment.             *No smoking, inhalers or caffeine 4 hour prior to your test please.              **To reschedule please call [phone]   Monday March 4,2024 10:00 am for Vascular Ultrasound Please arrive at the check in desk where you normally arrive for Heart Failure Clinic appointment desk 15 min early for this appointment. Ultrasounds of your neck and extremities and will last approximately 1 hour             **To reschedule please call [phone]  11:00am 1 month follow up with Dr. Aundra Dubin             ** To reschedule please call [phone]  Bobbye Morton RN, BSN VAD Coordinator 24/7 Pager [phone]

## 2023-01-17 LAB — AEROBIC CULTURE W GRAM STAIN (SUPERFICIAL SPECIMEN)

## 2023-01-17 LAB — T3, FREE: T3, Free: 3 pg/mL (ref 2.0–4.4)

## 2023-01-21 NOTE — Progress Notes (Signed)
Letter sent regarding upcoming appointment and referral information after multiple unsuccessful attempts to contact patient.  Bobbye Morton RN, BSN VAD Coordinator 24/7 Pager [phone]

## 2023-01-22 ENCOUNTER — Inpatient Hospital Stay (HOSPITAL_COMMUNITY)
Admission: RE | Admit: 2023-01-22 | Discharge: 2023-01-22 | Disposition: A | Discharge status: Home / Self Care | Payer: 59 | Source: Ambulatory Visit | Attending: Cardiology | Admitting: Cardiology

## 2023-01-27 ENCOUNTER — Encounter (HOSPITAL_COMMUNITY): Payer: Self-pay

## 2023-01-27 ENCOUNTER — Other Ambulatory Visit: Payer: Self-pay

## 2023-01-27 ENCOUNTER — Inpatient Hospital Stay (HOSPITAL_COMMUNITY)
Admission: EM | Admit: 2023-01-27 | Discharge: 2023-02-20 | DRG: 264 | Disposition: A | Discharge status: Skilled Nursing Facility | Payer: 59 | Source: Ambulatory Visit | Attending: Cardiology | Admitting: Cardiology

## 2023-01-27 ENCOUNTER — Emergency Department (HOSPITAL_COMMUNITY): Payer: 59

## 2023-01-27 ENCOUNTER — Inpatient Hospital Stay (HOSPITAL_COMMUNITY): Payer: 59

## 2023-01-27 DIAGNOSIS — E860 Dehydration: Secondary | ICD-10-CM | POA: Diagnosis present

## 2023-01-27 DIAGNOSIS — I480 Paroxysmal atrial fibrillation: Secondary | ICD-10-CM | POA: Diagnosis present

## 2023-01-27 DIAGNOSIS — I472 Ventricular tachycardia, unspecified: Secondary | ICD-10-CM | POA: Diagnosis not present

## 2023-01-27 DIAGNOSIS — I25119 Atherosclerotic heart disease of native coronary artery with unspecified angina pectoris: Secondary | ICD-10-CM | POA: Diagnosis not present

## 2023-01-27 DIAGNOSIS — J441 Chronic obstructive pulmonary disease with (acute) exacerbation: Secondary | ICD-10-CM | POA: Diagnosis present

## 2023-01-27 DIAGNOSIS — Z7984 Long term (current) use of oral hypoglycemic drugs: Secondary | ICD-10-CM

## 2023-01-27 DIAGNOSIS — Z87891 Personal history of nicotine dependence: Secondary | ICD-10-CM | POA: Diagnosis not present

## 2023-01-27 DIAGNOSIS — I959 Hypotension, unspecified: Secondary | ICD-10-CM | POA: Diagnosis not present

## 2023-01-27 DIAGNOSIS — N179 Acute kidney failure, unspecified: Secondary | ICD-10-CM | POA: Diagnosis present

## 2023-01-27 DIAGNOSIS — Z833 Family history of diabetes mellitus: Secondary | ICD-10-CM

## 2023-01-27 DIAGNOSIS — M1990 Unspecified osteoarthritis, unspecified site: Secondary | ICD-10-CM | POA: Diagnosis present

## 2023-01-27 DIAGNOSIS — Z79899 Other long term (current) drug therapy: Secondary | ICD-10-CM | POA: Diagnosis not present

## 2023-01-27 DIAGNOSIS — I5023 Acute on chronic systolic (congestive) heart failure: Secondary | ICD-10-CM | POA: Diagnosis not present

## 2023-01-27 DIAGNOSIS — T827XXD Infection and inflammatory reaction due to other cardiac and vascular devices, implants and grafts, subsequent encounter: Secondary | ICD-10-CM | POA: Diagnosis not present

## 2023-01-27 DIAGNOSIS — I110 Hypertensive heart disease with heart failure: Secondary | ICD-10-CM | POA: Diagnosis present

## 2023-01-27 DIAGNOSIS — R451 Restlessness and agitation: Secondary | ICD-10-CM | POA: Diagnosis not present

## 2023-01-27 DIAGNOSIS — F1590 Other stimulant use, unspecified, uncomplicated: Secondary | ICD-10-CM | POA: Insufficient documentation

## 2023-01-27 DIAGNOSIS — Z7901 Long term (current) use of anticoagulants: Secondary | ICD-10-CM

## 2023-01-27 DIAGNOSIS — Z792 Long term (current) use of antibiotics: Secondary | ICD-10-CM

## 2023-01-27 DIAGNOSIS — E871 Hypo-osmolality and hyponatremia: Secondary | ICD-10-CM | POA: Diagnosis not present

## 2023-01-27 DIAGNOSIS — R112 Nausea with vomiting, unspecified: Secondary | ICD-10-CM | POA: Diagnosis not present

## 2023-01-27 DIAGNOSIS — Z91148 Patient's other noncompliance with medication regimen for other reason: Secondary | ICD-10-CM

## 2023-01-27 DIAGNOSIS — E119 Type 2 diabetes mellitus without complications: Secondary | ICD-10-CM | POA: Diagnosis not present

## 2023-01-27 DIAGNOSIS — B9562 Methicillin resistant Staphylococcus aureus infection as the cause of diseases classified elsewhere: Secondary | ICD-10-CM | POA: Diagnosis present

## 2023-01-27 DIAGNOSIS — F32A Depression, unspecified: Secondary | ICD-10-CM | POA: Insufficient documentation

## 2023-01-27 DIAGNOSIS — E861 Hypovolemia: Secondary | ICD-10-CM | POA: Diagnosis present

## 2023-01-27 DIAGNOSIS — Y838 Other surgical procedures as the cause of abnormal reaction of the patient, or of later complication, without mention of misadventure at the time of the procedure: Secondary | ICD-10-CM | POA: Diagnosis present

## 2023-01-27 DIAGNOSIS — I255 Ischemic cardiomyopathy: Secondary | ICD-10-CM | POA: Diagnosis present

## 2023-01-27 DIAGNOSIS — Z6829 Body mass index (BMI) 29.0-29.9, adult: Secondary | ICD-10-CM

## 2023-01-27 DIAGNOSIS — F1523 Other stimulant dependence with withdrawal: Secondary | ICD-10-CM | POA: Diagnosis not present

## 2023-01-27 DIAGNOSIS — I509 Heart failure, unspecified: Secondary | ICD-10-CM | POA: Diagnosis not present

## 2023-01-27 DIAGNOSIS — E669 Obesity, unspecified: Secondary | ICD-10-CM | POA: Diagnosis present

## 2023-01-27 DIAGNOSIS — T827XXA Infection and inflammatory reaction due to other cardiac and vascular devices, implants and grafts, initial encounter: Secondary | ICD-10-CM | POA: Diagnosis present

## 2023-01-27 DIAGNOSIS — T829XXD Unspecified complication of cardiac and vascular prosthetic device, implant and graft, subsequent encounter: Secondary | ICD-10-CM

## 2023-01-27 DIAGNOSIS — F329 Major depressive disorder, single episode, unspecified: Secondary | ICD-10-CM | POA: Diagnosis present

## 2023-01-27 DIAGNOSIS — I2510 Atherosclerotic heart disease of native coronary artery without angina pectoris: Secondary | ICD-10-CM | POA: Diagnosis present

## 2023-01-27 DIAGNOSIS — Z7151 Drug abuse counseling and surveillance of drug abuser: Secondary | ICD-10-CM

## 2023-01-27 DIAGNOSIS — Z5320 Procedure and treatment not carried out because of patient's decision for unspecified reasons: Secondary | ICD-10-CM | POA: Diagnosis present

## 2023-01-27 DIAGNOSIS — T829XXA Unspecified complication of cardiac and vascular prosthetic device, implant and graft, initial encounter: Secondary | ICD-10-CM | POA: Diagnosis present

## 2023-01-27 DIAGNOSIS — Z22322 Carrier or suspected carrier of Methicillin resistant Staphylococcus aureus: Secondary | ICD-10-CM

## 2023-01-27 DIAGNOSIS — Z7951 Long term (current) use of inhaled steroids: Secondary | ICD-10-CM

## 2023-01-27 DIAGNOSIS — Z95811 Presence of heart assist device: Secondary | ICD-10-CM

## 2023-01-27 DIAGNOSIS — E1165 Type 2 diabetes mellitus with hyperglycemia: Secondary | ICD-10-CM | POA: Diagnosis present

## 2023-01-27 DIAGNOSIS — Z8249 Family history of ischemic heart disease and other diseases of the circulatory system: Secondary | ICD-10-CM

## 2023-01-27 DIAGNOSIS — Z951 Presence of aortocoronary bypass graft: Secondary | ICD-10-CM

## 2023-01-27 DIAGNOSIS — Z8614 Personal history of Methicillin resistant Staphylococcus aureus infection: Secondary | ICD-10-CM

## 2023-01-27 LAB — RAPID URINE DRUG SCREEN, HOSP PERFORMED
Amphetamines: POSITIVE — AB
Barbiturates: NOT DETECTED
Benzodiazepines: NOT DETECTED
Cocaine: NOT DETECTED
Opiates: NOT DETECTED
Tetrahydrocannabinol: NOT DETECTED

## 2023-01-27 LAB — GLUCOSE, CAPILLARY
Glucose-Capillary: 181 mg/dL — ABNORMAL HIGH (ref 70–99)
Glucose-Capillary: 283 mg/dL — ABNORMAL HIGH (ref 70–99)

## 2023-01-27 LAB — MRSA NEXT GEN BY PCR, NASAL: MRSA by PCR Next Gen: DETECTED — AB

## 2023-01-27 LAB — CBC
HCT: 32.8 % — ABNORMAL LOW (ref 39.0–52.0)
Hemoglobin: 10.9 g/dL — ABNORMAL LOW (ref 13.0–17.0)
MCH: 28.8 pg (ref 26.0–34.0)
MCHC: 33.2 g/dL (ref 30.0–36.0)
MCV: 86.8 fL (ref 80.0–100.0)
Platelets: 199 10*3/uL (ref 150–400)
RBC: 3.78 MIL/uL — ABNORMAL LOW (ref 4.22–5.81)
RDW: 14.2 % (ref 11.5–15.5)
WBC: 8 10*3/uL (ref 4.0–10.5)
nRBC: 0 % (ref 0.0–0.2)

## 2023-01-27 LAB — COMPREHENSIVE METABOLIC PANEL
ALT: 17 U/L (ref 0–44)
AST: 18 U/L (ref 15–41)
Albumin: 3.3 g/dL — ABNORMAL LOW (ref 3.5–5.0)
Alkaline Phosphatase: 101 U/L (ref 38–126)
Anion gap: 11 (ref 5–15)
BUN: 21 mg/dL (ref 8–23)
CO2: 20 mmol/L — ABNORMAL LOW (ref 22–32)
Calcium: 9.1 mg/dL (ref 8.9–10.3)
Chloride: 104 mmol/L (ref 98–111)
Creatinine, Ser: 1.05 mg/dL (ref 0.61–1.24)
GFR, Estimated: >60 mL/min (ref >60)
Glucose, Bld: 229 mg/dL — ABNORMAL HIGH (ref 70–99)
Potassium: 3.7 mmol/L (ref 3.5–5.1)
Sodium: 135 mmol/L (ref 135–145)
Total Bilirubin: 0.5 mg/dL (ref 0.3–1.2)
Total Protein: 6.5 g/dL (ref 6.5–8.1)

## 2023-01-27 LAB — URINALYSIS, ROUTINE W REFLEX MICROSCOPIC
Bilirubin Urine: NEGATIVE
Glucose, UA: 100 mg/dL — AB
Hgb urine dipstick: NEGATIVE
Ketones, ur: NEGATIVE mg/dL
Leukocytes,Ua: NEGATIVE
Nitrite: NEGATIVE
Protein, ur: NEGATIVE mg/dL
Specific Gravity, Urine: <1.005 — ABNORMAL LOW (ref 1.005–1.030)
pH: 5.5 (ref 5.0–8.0)

## 2023-01-27 LAB — LACTATE DEHYDROGENASE: LDH: 203 U/L — ABNORMAL HIGH (ref 98–192)

## 2023-01-27 LAB — PROTIME-INR
INR: 1.1 (ref 0.8–1.2)
Prothrombin Time: 14.2 seconds (ref 11.4–15.2)

## 2023-01-27 LAB — PREPARE RBC (CROSSMATCH)

## 2023-01-27 LAB — HEPARIN LEVEL (UNFRACTIONATED): Heparin Unfractionated: <0.1 IU/mL — ABNORMAL LOW (ref 0.30–0.70)

## 2023-01-27 LAB — APTT: aPTT: 32 seconds (ref 24–36)

## 2023-01-27 MED ORDER — PANTOPRAZOLE SODIUM 40 MG PO TBEC
40.0000 mg | DELAYED_RELEASE_TABLET | Freq: Every day | ORAL | Status: DC
  Administered 2023-01-27 – 2023-02-20 (×23): 40 mg via ORAL
  Filled 2023-01-27 (×25): qty 1

## 2023-01-27 MED ORDER — VANCOMYCIN HCL IN DEXTROSE 1-5 GM/200ML-% IV SOLN
1000.0000 mg | INTRAVENOUS | Status: DC
  Filled 2023-01-27: qty 200

## 2023-01-27 MED ORDER — IPRATROPIUM-ALBUTEROL 0.5-2.5 (3) MG/3ML IN SOLN
3.0000 mL | Freq: Four times a day (QID) | RESPIRATORY_TRACT | Status: DC
  Administered 2023-01-27 (×2): 3 mL via RESPIRATORY_TRACT
  Filled 2023-01-27 (×2): qty 3

## 2023-01-27 MED ORDER — HEPARIN (PORCINE) 25000 UT/250ML-% IV SOLN
500.0000 [IU]/h | INTRAVENOUS | Status: DC
  Administered 2023-01-27: 500 [IU]/h via INTRAVENOUS
  Filled 2023-01-27: qty 250

## 2023-01-27 MED ORDER — ALBUTEROL SULFATE (2.5 MG/3ML) 0.083% IN NEBU: 2.5000 mg | INHALATION_SOLUTION | RESPIRATORY_TRACT | Status: DC | PRN

## 2023-01-27 MED ORDER — METOPROLOL SUCCINATE ER 25 MG PO TB24
25.0000 mg | ORAL_TABLET | Freq: Every day | ORAL | Status: DC
  Administered 2023-01-27 – 2023-02-20 (×24): 25 mg via ORAL
  Filled 2023-01-27 (×25): qty 1

## 2023-01-27 MED ORDER — SERTRALINE HCL 25 MG PO TABS
50.0000 mg | ORAL_TABLET | Freq: Every day | ORAL | Status: DC
  Administered 2023-01-27 – 2023-02-20 (×23): 50 mg via ORAL
  Filled 2023-01-27 (×23): qty 2

## 2023-01-27 MED ORDER — ROSUVASTATIN CALCIUM 20 MG PO TABS
20.0000 mg | ORAL_TABLET | Freq: Every day | ORAL | Status: DC
  Administered 2023-01-27 – 2023-02-03 (×8): 20 mg via ORAL
  Filled 2023-01-27 (×8): qty 1

## 2023-01-27 MED ORDER — CHLORHEXIDINE GLUCONATE CLOTH 2 % EX PADS
6.0000 | MEDICATED_PAD | Freq: Every day | CUTANEOUS | Status: DC
  Administered 2023-01-27 – 2023-01-29 (×3): 6 via TOPICAL

## 2023-01-27 MED ORDER — ADULT MULTIVITAMIN W/MINERALS CH
1.0000 | ORAL_TABLET | ORAL | Status: DC
  Administered 2023-01-27 – 2023-02-19 (×20): 1 via ORAL
  Filled 2023-01-27 (×21): qty 1

## 2023-01-27 MED ORDER — TRAZODONE HCL 50 MG PO TABS
150.0000 mg | ORAL_TABLET | Freq: Every day | ORAL | Status: DC
  Administered 2023-01-27 – 2023-02-19 (×23): 150 mg via ORAL
  Filled 2023-01-27 (×24): qty 3

## 2023-01-27 MED ORDER — HYDRALAZINE HCL 25 MG PO TABS
12.5000 mg | ORAL_TABLET | Freq: Three times a day (TID) | ORAL | Status: DC
  Administered 2023-01-27 – 2023-01-29 (×4): 12.5 mg via ORAL
  Filled 2023-01-27 (×6): qty 1

## 2023-01-27 MED ORDER — MINOCYCLINE HCL 50 MG PO CAPS
200.0000 mg | ORAL_CAPSULE | Freq: Two times a day (BID) | ORAL | Status: DC
  Filled 2023-01-27 (×2): qty 4
  Filled 2023-01-27 (×2): qty 2

## 2023-01-27 MED ORDER — HEPARIN (PORCINE) 25000 UT/250ML-% IV SOLN
500.0000 [IU]/h | INTRAVENOUS | Status: DC
  Administered 2023-01-27: 500 [IU]/h via INTRAVENOUS

## 2023-01-27 MED ORDER — METFORMIN HCL 500 MG PO TABS
500.0000 mg | ORAL_TABLET | Freq: Two times a day (BID) | ORAL | Status: DC
  Administered 2023-01-27 – 2023-02-16 (×33): 500 mg via ORAL
  Filled 2023-01-27 (×34): qty 1

## 2023-01-27 MED ORDER — LORATADINE 10 MG PO TABS
10.0000 mg | ORAL_TABLET | Freq: Every day | ORAL | Status: DC
  Administered 2023-01-27 – 2023-02-20 (×24): 10 mg via ORAL
  Filled 2023-01-27 (×24): qty 1

## 2023-01-27 MED ORDER — SODIUM CHLORIDE 0.9 % IV SOLN
2.0000 g | Freq: Three times a day (TID) | INTRAVENOUS | Status: DC
  Administered 2023-01-27 – 2023-01-29 (×5): 2 g via INTRAVENOUS
  Filled 2023-01-27 (×8): qty 40

## 2023-01-27 MED ORDER — MUPIROCIN 2 % EX OINT
1.0000 | TOPICAL_OINTMENT | Freq: Two times a day (BID) | CUTANEOUS | Status: AC
  Administered 2023-01-28 – 2023-02-01 (×10): 1 via NASAL
  Filled 2023-01-27: qty 22

## 2023-01-27 MED ORDER — TAMSULOSIN HCL 0.4 MG PO CAPS
0.4000 mg | ORAL_CAPSULE | Freq: Every day | ORAL | Status: DC
  Administered 2023-01-27 – 2023-02-20 (×23): 0.4 mg via ORAL
  Filled 2023-01-27 (×24): qty 1

## 2023-01-27 MED ORDER — FLUTICASONE PROPIONATE 50 MCG/ACT NA SUSP
2.0000 | Freq: Every day | NASAL | Status: DC | PRN
  Administered 2023-01-29 – 2023-02-17 (×6): 2 via NASAL
  Filled 2023-01-27 (×3): qty 16

## 2023-01-27 MED ORDER — SACUBITRIL-VALSARTAN 24-26 MG PO TABS
1.0000 | ORAL_TABLET | Freq: Two times a day (BID) | ORAL | Status: DC
  Administered 2023-01-27 – 2023-01-29 (×5): 1 via ORAL
  Filled 2023-01-27 (×5): qty 1

## 2023-01-27 MED ORDER — VANCOMYCIN HCL IN DEXTROSE 1-5 GM/200ML-% IV SOLN
1000.0000 mg | Freq: Two times a day (BID) | INTRAVENOUS | Status: DC
  Administered 2023-01-28 – 2023-01-29 (×5): 1000 mg via INTRAVENOUS
  Filled 2023-01-27 (×6): qty 200

## 2023-01-27 MED ORDER — INSULIN ASPART 100 UNIT/ML IJ SOLN
0.0000 [IU] | Freq: Three times a day (TID) | INTRAMUSCULAR | Status: DC
  Administered 2023-01-27: 4 [IU] via SUBCUTANEOUS
  Administered 2023-01-28: 8 [IU] via SUBCUTANEOUS
  Administered 2023-01-28 – 2023-01-29 (×3): 2 [IU] via SUBCUTANEOUS
  Administered 2023-01-30 (×2): 4 [IU] via SUBCUTANEOUS
  Administered 2023-01-31: 2 [IU] via SUBCUTANEOUS
  Administered 2023-01-31: 4 [IU] via SUBCUTANEOUS
  Administered 2023-01-31 – 2023-02-01 (×2): 2 [IU] via SUBCUTANEOUS
  Administered 2023-02-01 (×2): 4 [IU] via SUBCUTANEOUS
  Administered 2023-02-02 (×2): 2 [IU] via SUBCUTANEOUS
  Administered 2023-02-03: 4 [IU] via SUBCUTANEOUS
  Administered 2023-02-03: 2 [IU] via SUBCUTANEOUS
  Administered 2023-02-04: 8 [IU] via SUBCUTANEOUS
  Administered 2023-02-05: 4 [IU] via SUBCUTANEOUS
  Administered 2023-02-05: 2 [IU] via SUBCUTANEOUS
  Administered 2023-02-05 – 2023-02-06 (×3): 4 [IU] via SUBCUTANEOUS
  Administered 2023-02-07 – 2023-02-10 (×5): 2 [IU] via SUBCUTANEOUS
  Administered 2023-02-10: 4 [IU] via SUBCUTANEOUS
  Administered 2023-02-11 – 2023-02-12 (×3): 2 [IU] via SUBCUTANEOUS
  Administered 2023-02-12 – 2023-02-14 (×2): 4 [IU] via SUBCUTANEOUS
  Administered 2023-02-14: 2 [IU] via SUBCUTANEOUS
  Administered 2023-02-15: 4 [IU] via SUBCUTANEOUS
  Administered 2023-02-15 – 2023-02-17 (×3): 2 [IU] via SUBCUTANEOUS

## 2023-01-27 MED ORDER — ZINC SULFATE 220 (50 ZN) MG PO CAPS
220.0000 mg | ORAL_CAPSULE | Freq: Every day | ORAL | Status: DC
  Administered 2023-01-27 – 2023-02-20 (×23): 220 mg via ORAL
  Filled 2023-01-27 (×24): qty 1

## 2023-01-27 MED ORDER — VANCOMYCIN HCL 2000 MG/400ML IV SOLN
2000.0000 mg | Freq: Once | INTRAVENOUS | Status: AC
  Administered 2023-01-27: 2000 mg via INTRAVENOUS
  Filled 2023-01-27: qty 400

## 2023-01-27 MED ORDER — SODIUM CHLORIDE 0.9 % IV SOLN
2.0000 g | Freq: Once | INTRAVENOUS | Status: AC
  Administered 2023-01-27: 2 g via INTRAVENOUS
  Filled 2023-01-27: qty 12.5

## 2023-01-27 MED ORDER — INSULIN ASPART 100 UNIT/ML IJ SOLN
0.0000 [IU] | Freq: Every day | INTRAMUSCULAR | Status: DC
  Administered 2023-01-27: 3 [IU] via SUBCUTANEOUS
  Administered 2023-01-31 – 2023-02-05 (×2): 2 [IU] via SUBCUTANEOUS

## 2023-01-27 MED ORDER — SODIUM CHLORIDE 0.9 % IV SOLN: 2.0000 g | Freq: Three times a day (TID) | INTRAVENOUS | Status: DC

## 2023-01-27 MED ORDER — IPRATROPIUM-ALBUTEROL 20-100 MCG/ACT IN AERS: 1.0000 | INHALATION_SPRAY | Freq: Four times a day (QID) | RESPIRATORY_TRACT | Status: DC

## 2023-01-27 MED ORDER — IPRATROPIUM-ALBUTEROL 0.5-2.5 (3) MG/3ML IN SOLN
3.0000 mL | Freq: Two times a day (BID) | RESPIRATORY_TRACT | Status: DC
  Administered 2023-01-29 – 2023-01-30 (×3): 3 mL via RESPIRATORY_TRACT
  Filled 2023-01-27 (×5): qty 3

## 2023-01-27 MED ORDER — POTASSIUM CHLORIDE CRYS ER 20 MEQ PO TBCR
20.0000 meq | EXTENDED_RELEASE_TABLET | Freq: Once | ORAL | Status: AC
  Administered 2023-01-27: 20 meq via ORAL
  Filled 2023-01-27: qty 1

## 2023-01-27 MED ORDER — ALBUTEROL SULFATE (2.5 MG/3ML) 0.083% IN NEBU
2.5000 mg | INHALATION_SOLUTION | Freq: Four times a day (QID) | RESPIRATORY_TRACT | Status: DC | PRN
  Administered 2023-01-28 – 2023-01-30 (×5): 2.5 mg via RESPIRATORY_TRACT
  Filled 2023-01-27 (×6): qty 3

## 2023-01-27 MED ORDER — ACETAMINOPHEN 325 MG PO TABS
650.0000 mg | ORAL_TABLET | ORAL | Status: DC | PRN
  Administered 2023-01-27 – 2023-02-13 (×3): 650 mg via ORAL
  Filled 2023-01-27 (×3): qty 2

## 2023-01-27 MED ORDER — GLUCERNA SHAKE PO LIQD
237.0000 mL | Freq: Three times a day (TID) | ORAL | Status: DC
  Administered 2023-01-31 – 2023-02-20 (×22): 237 mL via ORAL

## 2023-01-27 MED ORDER — ALBUTEROL SULFATE HFA 108 (90 BASE) MCG/ACT IN AERS: 2.0000 | INHALATION_SPRAY | Freq: Four times a day (QID) | RESPIRATORY_TRACT | Status: DC | PRN

## 2023-01-27 MED ORDER — FUROSEMIDE 10 MG/ML IJ SOLN
40.0000 mg | Freq: Once | INTRAMUSCULAR | Status: AC
  Administered 2023-01-27: 40 mg via INTRAVENOUS
  Filled 2023-01-27: qty 4

## 2023-01-27 MED ORDER — COLCHICINE 0.6 MG PO TABS
0.6000 mg | ORAL_TABLET | Freq: Every day | ORAL | Status: DC
  Administered 2023-01-27 – 2023-01-28 (×2): 0.6 mg via ORAL
  Filled 2023-01-27 (×2): qty 1

## 2023-01-27 MED ORDER — GUAIFENESIN ER 600 MG PO TB12
600.0000 mg | ORAL_TABLET | Freq: Two times a day (BID) | ORAL | Status: DC
  Administered 2023-01-27 – 2023-02-20 (×41): 600 mg via ORAL
  Filled 2023-01-27 (×42): qty 1

## 2023-01-27 MED ORDER — THIAMINE HCL 100 MG/ML IJ SOLN
100.0000 mg | Freq: Every day | INTRAMUSCULAR | Status: DC
  Administered 2023-01-27 – 2023-02-05 (×9): 100 mg via INTRAVENOUS
  Filled 2023-01-27 (×9): qty 2

## 2023-01-27 MED ORDER — ALLOPURINOL 100 MG PO TABS
100.0000 mg | ORAL_TABLET | Freq: Every day | ORAL | Status: DC
  Administered 2023-01-27 – 2023-02-20 (×23): 100 mg via ORAL
  Filled 2023-01-27 (×23): qty 1

## 2023-01-27 MED ORDER — ONDANSETRON HCL 4 MG/2ML IJ SOLN
4.0000 mg | Freq: Four times a day (QID) | INTRAMUSCULAR | Status: DC | PRN
  Administered 2023-02-02 – 2023-02-18 (×9): 4 mg via INTRAVENOUS
  Filled 2023-01-27 (×9): qty 2

## 2023-01-27 MED ORDER — GLIPIZIDE 5 MG PO TABS
5.0000 mg | ORAL_TABLET | Freq: Every day | ORAL | Status: DC
  Administered 2023-01-29 – 2023-02-20 (×20): 5 mg via ORAL
  Filled 2023-01-27 (×25): qty 1

## 2023-01-27 MED ORDER — CHLORHEXIDINE GLUCONATE CLOTH 2 % EX PADS
6.0000 | MEDICATED_PAD | Freq: Every day | CUTANEOUS | Status: AC
  Administered 2023-01-28 – 2023-02-01 (×5): 6 via TOPICAL

## 2023-01-27 NOTE — Progress Notes (Signed)
Procedure(s) (LRB): ABDOMINAL DRIVELINE WOUND DEBRIDEMENT (N/A) APPLICATION OF WOUND VAC (N/A) Subjective: Patient examined, VAD tunnel exit wound cultured and sterile dressing reapplied.  The patient is status post HeartMate 3 implantation for ischemic cardiomyopathy and 2020.  He has been followed closely in the VAD clinic for problems with his VAD driveline tunnel.  He required hospitalization last summer and for surgical debridements with wound VAC therapy for a significant abdominal wall tunnel infection with MRSA.  This finally was controlled with closure of the infected space around the driveline and he was maintained on oral suppressive antibiotics.  He has had difficulty maintaining adequate wound care hygiene at home.  He was checked about 2 weeks ago in the VAD clinic and had no space in the driveline tunnel and minimal drainage.  Today he presents with onset of heavy purulent drainage and now a 5 cm space in the power cord tunnel has developed.  The patient is not toxic.  He has no upper abdominal tenderness in the region of the VAD pump pocket.  A CT scan of the abdominal wall is pending.  The patient will need surgical debridement of the VAD tunnel which will extend to the subxiphoid region of the abdominal wall.  A wound VAC will be planned for wound management and the patient will need IV antibiotics.  He was started on IV vancomycin and cefepime in the ED.  Objective: Vital signs in last 24 hours: Temp:  [97.4 F (36.3 C)] 97.4 F (36.3 C) (02/19 0859) Pulse Rate:  [41-43] 43 (02/19 0845) BP: (151-155)/(115-122) 155/115 (02/19 0845) SpO2:  [86 %-93 %] 93 % (02/19 0845) Weight:  [93.9 kg] 93.9 kg (02/19 0846)  Hemodynamic parameters for last 24 hours:    Intake/Output from previous day: No intake/output data recorded. Intake/Output this shift: No intake/output data recorded.      Physical Exam  General: Alert and pleasant 62 year old male no acute distress. HEENT:  Normocephalic pupils equal , dentition with poor hygiene Neck: Supple without JVD, adenopathy, or bruit Chest: Scattered rhonchi and wheezes.  Patient states he has been smoking methamphetamine. Cardiac-sinus rhythm, normal VAD hum No peripheral pulses but extremities are warm and well-perfused Abdomen:  Soft, nontender, no palpable mass or organomegaly.  No tenderness of the abdominal wall above the exit site of the power cord.  Purulent drainage from the exit site with a least a 5 cm tunnel extending from the skin proximally into the abdominal wall. Extremities: Warm, well-perfused, no clubbing cyanosis edema or tenderness,              no venous stasis changes of the legs Rectal/GU: Deferred Neuro: Grossly non--focal and symmetrical throughout   Lab Results: Recent Labs    01/27/23 0846  WBC 8.0  HGB 10.9*  HCT 32.8*  PLT 199   BMET:  Recent Labs    01/27/23 0846  NA 135  K 3.7  CL 104  CO2 20*  GLUCOSE 229*  BUN 21  CREATININE 1.05  CALCIUM 9.1    PT/INR:  Recent Labs    01/27/23 0846  LABPROT 14.2  INR 1.1   ABG    Component Value Date/Time   PHART 7.508 (H) 11/20/2020 0438   HCO3 24.8 09/09/2021 0313   TCO2 29 11/20/2020 0438   ACIDBASEDEF 6.0 (H) 11/24/2019 1718   O2SAT 63.4 09/09/2021 0313   CBG (last 3)  No results for input(s): "GLUCAP" in the last 72 hours.  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  The patient will be admitted and placed on IV antibiotics pending blood and wound cultures.  IV vancomycin and Maxipime have been started.  The patient's INR is 1.1.  He will be prepared for surgical debridement of the VAD infected tunnel with wound VAC placement tomorrow under general anesthesia.  I have discussed the details of the surgery as well as the expected benefits and alternatives with the patient.  Mr.  Fulmer agrees to proceed with surgery on the VAD power cord tunnel tomorrow.   LOS: 0 days    [name] 01/27/2023

## 2023-01-27 NOTE — Progress Notes (Signed)
Pharmacy Antibiotic Note  Kevin Underwood. is a 62 y.o. male with hx HF s/p LVAD admitted on 01/27/2023 with cellulitis.  Pharmacy has been consulted for vancomycin/cefepime > now changing to vancomycin and meropenem per ID rec's.  SCr 1.05 on admit.  Going to OR for I&D tomorrow.  Plan: Meropenem 2g IV q 8 hrs. Vancomycin 2g IV x 1; then 1g IV q12h. Goal AUC 400-550. Expected AUC: 442 SCr used: 1.05 Monitor clinical progress, c/s, renal function F/u de-escalation plan/LOT, vancomycin levels as indicated   Height: 5' 10"$  (177.8 cm) Weight: 93.9 kg (207 lb) IBW/kg (Calculated) : 73  Temp (24hrs), Avg:97.4 F (36.3 C), Min:97.4 F (36.3 C), Max:97.4 F (36.3 C)  Recent Labs  Lab 01/27/23 0846  WBC 8.0  CREATININE 1.05    Estimated Creatinine Clearance: 84 mL/min (by C-G formula based on SCr of 1.05 mg/dL).    No Known Allergies  Antimicrobials this admission: 2/19 vancomycin >>  2/19 cefepime >>   Dose adjustments this admission:   Microbiology results:   Marguerite Olea, [facility] Clinical Pharmacist  01/27/2023 12:50 PM   [facility] pharmacy phone numbers are listed on [email or web address]

## 2023-01-27 NOTE — Progress Notes (Signed)
LVAD Coordinator ED Encounter  Kevin Underwood. a 62 y.o. male that presented to [facility] ER today due to driveline infection. 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   Pt called EMS this morning because he states "I woke up and my driveline smelled." Pts driveline is grossly inflamed - full note below.  Pt tells me that he is not living at home but is staying with his mom. Pt states that he doesn't have a phone to call the clinic.  Dr Prescott Gum at bedside, culture obtained. Pt will go to OR tomorrow or Wednesday depending on OR availibiltiy. CT chest/abd/pelvis ordered per PVT.   Vital signs: Temp: 97.4 HR: 102 Doppler MAP: 120/0 Automated BP: 115/115 (129) O2 Sat: 93 RA  LVAD interrogation reveals:  Speed: 5600 Flow: 3.9 Power:  4.2 PI: 5.9  Alarms: several LV - Pt tells me that he stays on batteries all the time because when he is on his MPU that he is constantly alarming. Pt was given a loaner MPU at his last visit because of this same issue. VAD coordinator informed pt today that it is not the MPU it must be the outlet that the MPU is plugged into. Events: 5-8 daily  Drive Line: Existing VAD dressing removed and site care performed using sterile technique. Drive line exit site cleaned with Chlora prep applicators x 2, Vashe solution, allowed to dry, and packed with Vashe soaked 2 x 2, covered with gauze dressing and tape by Dr Prescott Gum. Exit site swollen and unincorporated, the velour is exposed significantly at exit site. Large amount of redness, no tenderness, moderate amount of thick yellow drainage, gross foul odor noted. No rash. Drive line anchor re-applied.        Significant Events with LVAD: Patient  had a prolonged admission in 6/23 and again in 7/23-8/23 with MRSA driveline infection.  He went to the OR multiple times for debridement and wound vac changes.  He completed vancomycin and was placed on doxycycline.    Levofloxacin added d/t increased drainage and redness after last f/u on 09/27. Wound culture grew acinetobacter.  Wound initially improved but cultures repeated d/t decreased healing over last few dressing changes. Levofloxacin recently stopped and back on doxy.  Grew acinetobacter and MRSA on culture. ICD recommended switching to minocycline 200 BID based on culture results.   Patient was admitted with swollen and erythematous right wrist in 10/23.  There was concern for septic arthritis. CT showed no effusion that could be tapped.  Suspect this was likely a gout flare as it improved rapidly with colchicine.    Updated VAD Providers (Dr Aundra Dubin and Darrick Grinder, NP) about the above. No LVAD issues and pump is functioning as expected. Able to independently manage LVAD equipment. No LVAD needs at this time.   Will admit pt to [place]. Pt will go to OR tomorrow for driveline debridement.  [name] BSN, RN VAD Coordinator   Office: [phone] 24/7 Emergency VAD Pager: [phone]

## 2023-01-27 NOTE — ED Notes (Signed)
Patient transported to CT 

## 2023-01-27 NOTE — ED Provider Notes (Signed)
[facility] Provider Note   CSN: [id number] Arrival date & time:        History  Chief Complaint  Patient presents with   LVAD   Infection    Kevin Underwood. is a 62 y.o. male.  62 year old male with a history of heart failure status post LVAD placement who presents to the emergency department with concerns for wound infection.  Patient reports that in the past 24 hours started noticing foul smelling drainage from his LVAD driveline site.  Denies any fevers.  Says that he has had redness surrounding the wound since the driveline was placed.  No chest pain.  Says that he has shortness of breath at baseline and has not changed.  Denies any bloody stools or melena recently.  Says that his LVAD is always alarming but is unsure of which alarms have been registering.       Home Medications Prior to Admission medications   Medication Sig Start Date End Date Taking? Authorizing Provider  albuterol (PROVENTIL) (2.5 MG/3ML) 0.083% nebulizer solution INHALE 3 ML BY NEBULIZATION EVERY 6 HOURS AS NEEDED FOR WHEEZING OR SHORTNESS OF BREATH 09/10/22   Larey Dresser, MD  albuterol (VENTOLIN HFA) 108 (90 Base) MCG/ACT inhaler Inhale 2 puffs into the lungs every 6 (six) hours as needed for wheezing or shortness of breath.    [provider]  allopurinol (ZYLOPRIM) 100 MG tablet Take 1 tablet (100 mg total) by mouth daily. Patient not taking: Reported on 01/15/2023 12/04/21   Larey Dresser, MD  blood glucose meter kit and supplies KIT Dispense based on patient and insurance preference. Use to check CBG's three times a day. (FOR ICD-9 250.00, 250.01). 09/25/20   Barton Dubois, MD  colchicine 0.6 MG tablet Take 1 tablet (0.6 mg total) by mouth as needed. 10/07/22   Larey Dresser, MD  COMBIVENT RESPIMAT 20-100 MCG/ACT AERS respimat Inhale 1 puff into the lungs every 6 (six) hours. 09/10/22   [provider]  enoxaparin (LOVENOX)  40 MG/0.4ML injection Inject 0.4 mLs (40 mg total) into the skin every 12 (twelve) hours. Patient not taking: Reported on 01/15/2023 10/07/22   Larey Dresser, MD  fluticasone [facility] ALLERGY RELIEF) 50 MCG/ACT nasal spray Place 2 sprays into both nostrils as needed for allergies or rhinitis.    [provider]  fluticasone furoate-vilanterol (BREO ELLIPTA) 100-25 MCG/ACT AEPB Inhale 1 puff into the lungs daily.    [provider]  furosemide (LASIX) 40 MG tablet Take 0.5 tablets (20 mg total) by mouth daily as needed. 01/15/23   Larey Dresser, MD  gabapentin (NEURONTIN) 600 MG tablet Take 1 tablet (600 mg total) by mouth 3 (three) times daily. 10/03/22   Clegg, Amy D, NP  glipiZIDE (GLUCOTROL) 5 MG tablet Take 1 tablet (5 mg total) by mouth daily. 06/03/22 06/03/23  Clegg, Amy D, NP  guaiFENesin (MUCINEX) 600 MG 12 hr tablet Take 1 tablet (600 mg total) by mouth 2 (two) times daily. 09/11/21   Clegg, Amy D, NP  hydrALAZINE (APRESOLINE) 25 MG tablet Take 0.5 tablets (12.5 mg total) by mouth 3 (three) times daily. 09/04/22 05/02/23  Larey Dresser, MD  loratadine (CLARITIN) 10 MG tablet Take 1 tablet (10 mg total) by mouth daily. 09/12/21   Clegg, Amy D, NP  metFORMIN (GLUCOPHAGE) 500 MG tablet TAKE 1 TABLET BY MOUTH 2 TIMES DAILY WITH A MEAL. 12/05/22   Cassandria Anger, MD  methimazole (TAPAZOLE) 5 MG tablet TAKE 1 TABLET BY MOUTH EVERY DAY WITH BREAKFAST Patient taking differently: Take 5 mg by mouth See admin instructions. Take one tablet (5 mg) by mouth every day with breakfast. 08/22/22   Nida, Marella Chimes, MD  metoprolol succinate (TOPROL XL) 25 MG 24 hr tablet Take 1 tablet (25 mg total) by mouth daily. 01/09/22   Larey Dresser, MD  minocycline (MINOCIN) 100 MG capsule Take 2 capsules (200 mg total) by mouth 2 (two) times daily. 09/30/22   [place] Callas, NP  Multiple Vitamin (MULTIVITAMIN WITH MINERALS) TABS tablet Take 1 tablet by mouth daily. 12/09/19   Gold,  Wayne E, PA-C  pantoprazole (PROTONIX) 40 MG tablet TAKE 1 TABLET BY MOUTH EVERY DAY Patient taking differently: Take 40 mg by mouth daily. 11/13/21   Larey Dresser, MD  rosuvastatin (CRESTOR) 20 MG tablet Take 20 mg by mouth daily.    [provider]  sacubitril-valsartan (ENTRESTO) 24-26 MG Take 1 tablet by mouth daily. 01/15/23   Larey Dresser, MD  sertraline (ZOLOFT) 50 MG tablet Take 1 tablet (50 mg total) by mouth daily. 12/04/21   Larey Dresser, MD  tamsulosin (FLOMAX) 0.4 MG CAPS capsule TAKE 1 CAPSULE BY MOUTH EVERY DAY 11/11/22   Larey Dresser, MD  Tetrahydrozoline HCl (REDNESS RELIEVER EYE DROPS OP) Apply 2 drops to eye daily as needed.    [provider]  traMADol (ULTRAM) 50 MG tablet Take 1 tablet (50 mg total) by mouth 2 (two) times daily as needed. Patient not taking: Reported on 01/15/2023 10/03/22   Darrick Grinder D, NP  traZODone (DESYREL) 150 MG tablet Take 1 tablet (150 mg total) by mouth at bedtime. 03/11/22   Larey Dresser, MD  TRELEGY ELLIPTA 100-62.5-25 MCG/ACT AEPB Inhale 1 puff into the lungs daily. 09/10/22   [provider]  warfarin (COUMADIN) 4 MG tablet On 10/5 take 4 mg then resume 6 mg (1.5 tabs) on Mon/Wed/Fri and 4 mg (1 tab) all other days or as directed by HF Clinic Patient taking differently: See admin instructions. Take one and a half tablets (6 mg) M, W, F, and one tablet (4 mg) all other days. 09/12/21   Darrick Grinder D, NP      Allergies    Patient has no known allergies.    Review of Systems   Review of Systems  Physical Exam Updated Vital Signs BP (!) 155/115   Pulse (!) 43   Temp (!) 97.4 F (36.3 C) (Oral)   Ht 5' 10"$  (1.778 m)   Wt 93.9 kg   SpO2 93%   BMI 29.70 kg/m  Physical Exam Vitals and nursing note reviewed.  Constitutional:      General: He is not in acute distress.    Appearance: He is well-developed.  HENT:     Head: Normocephalic and atraumatic.     Right Ear: External ear normal.     Left  Ear: External ear normal.     Nose: Nose normal.  Eyes:     Extraocular Movements: Extraocular movements intact.     Conjunctiva/sclera: Conjunctivae normal.     Pupils: Pupils are equal, round, and reactive to light.  Cardiovascular:     Comments: LVAD controller in place with 2 batteries.  Driveline appears to have drainage coming from the wound.  See image below.  Precordial hum auscultated Pulmonary:     Effort: Pulmonary effort is normal. No respiratory distress.  Breath sounds: Wheezing (Faint bilaterally diffuse) present.     Comments: Normal work of breathing Abdominal:     General: There is distension.     Palpations: Abdomen is soft. There is no mass.     Tenderness: There is no abdominal tenderness. There is no guarding.  Musculoskeletal:     Cervical back: Normal range of motion and neck supple.     Right lower leg: No edema.     Left lower leg: No edema.  Skin:    General: Skin is warm and dry.  Neurological:     Mental Status: He is alert. Mental status is at baseline.  Psychiatric:        Mood and Affect: Mood normal.        Behavior: Behavior normal.      ED Results / Procedures / Treatments   Labs (all labs ordered are listed, but only abnormal results are displayed) Labs Reviewed  CULTURE, BLOOD (ROUTINE X 2)  CULTURE, BLOOD (ROUTINE X 2)  AEROBIC CULTURE W GRAM STAIN (SUPERFICIAL SPECIMEN)  CBC  COMPREHENSIVE METABOLIC PANEL  PROTIME-INR  APTT  LACTATE DEHYDROGENASE  RAPID URINE DRUG SCREEN, HOSP PERFORMED  URINALYSIS, ROUTINE W REFLEX MICROSCOPIC  TYPE AND SCREEN    EKG None  Radiology No results found.  Procedures Procedures   Medications Ordered in ED Medications  ceFEPIme (MAXIPIME) 2 g in sodium chloride 0.9 % 100 mL IVPB (2 g Intravenous New Bag/Given 01/27/23 0909)  vancomycin (VANCOREADY) IVPB 2000 mg/400 mL (has no administration in time range)    ED Course/ Medical Decision Making/ A&P Clinical Course as of 01/27/23 0911   Mon Jan 27, 2023  [id number] Dr Prescott Gum at the bedside. Will admit patient. No additional recommendations at this time.  [RP]    Clinical Course User Index [RP] Fransico Meadow, MD                             Medical Decision Making Amount and/or Complexity of Data Reviewed Labs: ordered. Radiology: ordered.  Risk Decision regarding hospitalization.   Jovonni Lafountaine. is a 62 y.o. male with comorbidities that complicate the patient evaluation including heart failure with LVAD who presents to the emergency department with concerns for driveline infection  Initial Ddx:  Driveline infection, sepsis  MDM:  Concern the patient has driveline infection.  Says that it started over the past 24 hours but appears to be grossly infected so doubt that this is the case.  Overall is not septic appearing.  Will start the patient on antibiotics and consult LVAD team.  Will also get preoperative labs and blood cultures in case patient goes to the OR.  Plan:  Labs Coags Type and screen LDH Blood cultures Vancomycin and cefepime  ED Summary/Re-evaluation:  [name] from LVAD team at the bedside.  Labs sent.  Patient started on vancomycin and cefepime and remained stable in the emergency department.  Patient assessed by LVAD team at the bedside who will admit him for further management.  This patient presents to the ED for concern of complaints listed in HPI, this involves an extensive number of treatment options, and is a complaint that carries with it a high risk of complications and morbidity. Disposition including potential need for admission considered.   Dispo: Admit  Additional history obtained from EMS Records reviewed Outpatient Clinic Notes I independently reviewed the following imaging with scope of interpretation limited to determining  acute life threatening conditions related to emergency care: Chest x-ray and agree with the radiologist interpretation with the following  exceptions: none I personally reviewed and interpreted cardiac monitoring: normal sinus rhythm  I personally reviewed and interpreted the pt's EKG: see above for interpretation  I have reviewed the patients home medications and made adjustments as needed Consults:  CT Surgery  Final Clinical Impression(s) / ED Diagnoses Final diagnoses:  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 (LVAD) [facility])    Rx / DC Orders ED Discharge Orders     None         Fransico Meadow, MD 01/27/23 [id number]

## 2023-01-27 NOTE — ED Notes (Signed)
ED TO INPATIENT HANDOFF REPORT  ED Nurse Name and Phone #:  [phone]  S Name/Age/Gender Kevin Underwood. 62 y.o. male Room/Bed: 002C/002C  Code Status   Code Status: Partial Code  Home/SNF/Other Home Patient oriented to: self, place, time, and situation Is this baseline? 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 He has had LVAD for 2 years & reports there has always been a "knot" & some "redness" at the site but this morning he could smell it, denies pain. A/Ox4 EMS reports VSS.     Allergies No Known Allergies  Level of Care/Admitting Diagnosis ED Disposition     ED Disposition  Admit   Condition  --   Comment  Hospital Area: [place] [[id number]]  Level of Care: Progressive [102]  Admit to Progressive based on following criteria: CARDIOVASCULAR & THORACIC of moderate stability with acute coronary syndrome symptoms/low risk myocardial infarction/hypertensive urgency/arrhythmias/heart failure potentially compromising stability and stable post cardiovascular intervention patients.  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 s/p CABG in 11/2019 with LIMA-LAD, SVG-OM1, SVG-PDA and SVG-D1    CHF (congestive heart failure) ([place])    a. EF < 20% by echo in 11/2019   Essential hypertension    PAF (paroxysmal atrial fibrillation) (HCC)    Type 2 diabetes mellitus ([place])    Past Surgical History:  Procedure Laterality Date   APPLICATION OF WOUND VAC Left 06/14/2022   Procedure: APPLICATION OF WOUND VAC;  Surgeon: [name], MD;  Location: [place];  Service: Thoracic;  Laterality: Left;   APPLICATION OF WOUND VAC N/A 06/20/2022   Procedure: VAD POWERCORD TUNNEL WOUND VAC CHANGE;  Surgeon: [name], MD;  Location: [place];  Service: Thoracic;  Laterality: N/A;   APPLICATION OF WOUND VAC N/A 06/27/2022   Procedure: WOUND VAC CHANGE;  Surgeon: [name], MD;  Location: [place];  Service: Thoracic;  Laterality: N/A;   APPLICATION OF WOUND VAC N/A 07/08/2022   Procedure: WOUND VAC CHANGE;  Surgeon: [name], MD;  Location: [place];  Service: Thoracic;  Laterality: N/A;   BACK SURGERY     CLIPPING OF ATRIAL APPENDAGE N/A 11/26/2019   Procedure: Clipping Of Atrial Appendage using AtriCure 40 Clip;  Surgeon: Wonda Olds, MD;  Location: [place];  Service: Open Heart Surgery;  Laterality: N/A;   CORONARY ARTERY BYPASS GRAFT N/A 11/26/2019   Procedure: CORONARY ARTERY BYPASS GRAFTING (CABG) using endoscopic greater saphenous vein harvest: svc to OM; svc to Diag; svc to PD; and LIMA to LAD.;  Surgeon: Wonda Olds, MD;  Location: [place];  Service: Open Heart Surgery;  Laterality: N/A;   FOOT SURGERY     HAND SURGERY  INSERTION OF IMPLANTABLE LEFT VENTRICULAR ASSIST DEVICE N/A 11/17/2020   Procedure: INSERTION OF IMPLANTABLE LEFT VENTRICULAR ASSIST DEVICE - HM3;  Surgeon: Wonda Olds, MD;  Location: [place];  Service: Open Heart Surgery;  Laterality: N/A;   INTRAOPERATIVE TRANSESOPHAGEAL ECHOCARDIOGRAM  12/04/2019   Procedure: Intraoperative Transesophageal Echocardiogram;  Surgeon: Wonda Olds, MD;  [facility];  Service: Open Heart Surgery;;   MAZE N/A  11/26/2019   Procedure: MAZE using Bilateral Pulmonary Vein isolation.;  Surgeon: Wonda Olds, MD;  Location: [place];  Service: Open Heart Surgery;  Laterality: N/A;   MULTIPLE EXTRACTIONS WITH ALVEOLOPLASTY N/A 11/16/2020   Procedure: MULTIPLE EXTRACTION WITH ALVEOLOPLASTY;  Surgeon: Charlaine Dalton, DMD;  Location: [place];  Service: Dentistry;  Laterality: N/A;   PLACEMENT OF IMPELLA LEFT VENTRICULAR ASSIST DEVICE N/A 11/24/2019   Procedure: PLACEMENT OF IMPELLA 5.5 LEFT VENTRICULAR ASSIST DEVICE;  Surgeon: Wonda Olds, MD;  Location: [place];  Service: Open Heart Surgery;  Laterality: N/A;   PLACEMENT OF IMPELLA LEFT VENTRICULAR ASSIST DEVICE Left 11/10/2020   Procedure: PLACEMENT OF IMPELLA 5.5 LEFT VENTRICULAR ASSIST DEVICE VIA  LEFT AVILLARY ARTERY;  Surgeon: Wonda Olds, MD;  Location: [place];  Service: Open Heart Surgery;  Laterality: Left;   REMOVAL OF IMPELLA LEFT VENTRICULAR ASSIST DEVICE Right 12/04/2019   Procedure: REMOVAL OF IMPELLA LEFT VENTRICULAR ASSIST DEVICE, right axilla;  Surgeon: Wonda Olds, MD;  Location: [place];  Service: Open Heart Surgery;  Laterality: Right;   RIGHT/LEFT HEART CATH AND CORONARY ANGIOGRAPHY N/A 11/24/2019   Procedure: RIGHT/LEFT HEART CATH AND CORONARY ANGIOGRAPHY;  Surgeon: Larey Dresser, MD;  Location: [place] [facility];  Service: Cardiovascular;  Laterality: N/A;   RIGHT/LEFT HEART CATH AND CORONARY/GRAFT ANGIOGRAPHY N/A 11/09/2020   Procedure: RIGHT/LEFT HEART CATH AND CORONARY/GRAFT ANGIOGRAPHY;  Surgeon: Nelva Bush, MD;  Location: [place] [facility];  Service: Cardiovascular;  Laterality: N/A;   STERNAL WOUND DEBRIDEMENT N/A 05/16/2022   Procedure: VAD DRIVELINE DEBRIDEMENT;  Surgeon: [name], MD;  Location: [place];  Service: Thoracic;  Laterality: N/A;  VANC PULSE LAVAGE   STERNAL WOUND DEBRIDEMENT Left 06/14/2022   Procedure: DEBRIDEMENT OF VAD POWERCORD TUNNEL;  Surgeon: [name], MD;  Location: [place];   Service: Thoracic;  Laterality: Left;   STERNAL WOUND DEBRIDEMENT N/A 06/27/2022   Procedure: ABDOMINAL WOUND DEBRIDEMENT, APPLICATION OF MYRIAD;  Surgeon: [name], MD;  Location: [place];  Service: Thoracic;  Laterality: N/A;   STERNAL WOUND DEBRIDEMENT N/A 07/08/2022   Procedure: ABDOMINAL WOUND DEBRIDEMENT;  Surgeon: [name], MD;  Location: [place];  Service: Thoracic;  Laterality: N/A;   TEE WITHOUT CARDIOVERSION N/A 11/24/2019   Procedure: TRANSESOPHAGEAL ECHOCARDIOGRAM (TEE);  Surgeon: Wonda Olds, MD;  Location: [place];  Service: Open Heart Surgery;  Laterality: N/A;   TEE WITHOUT CARDIOVERSION N/A 11/26/2019   Procedure: TRANSESOPHAGEAL ECHOCARDIOGRAM (TEE);  Surgeon: Wonda Olds, MD;  Location: [place];  Service: Open Heart Surgery;  Laterality: N/A;   TEE WITHOUT CARDIOVERSION N/A 11/10/2020   Procedure: TRANSESOPHAGEAL ECHOCARDIOGRAM (TEE);  Surgeon: Wonda Olds, MD;  Location: [place];  Service: Open Heart Surgery;  Laterality: N/A;   TEE WITHOUT CARDIOVERSION N/A 11/17/2020   Procedure: TRANSESOPHAGEAL ECHOCARDIOGRAM (TEE);  Surgeon: Wonda Olds, MD;  Location: [place];  Service: Open Heart Surgery;  Laterality: N/A;   WOUND EXPLORATION N/A 05/31/2022   Procedure: WOUND EXPLORATION, Debridement and Irrigation of abdominal wound;  Surgeon: [name], MD;  [facility];  Service: Thoracic;  Laterality: N/A;     A IV Location/Drains/Wounds Patient Lines/Drains/Airways Status     Active Line/Drains/Airways     Name Placement date Placement time Site Days   Peripheral IV 01/27/23 20 G Anterior;Left Forearm 01/27/23  0855  Forearm  less than 1   Peripheral IV 01/27/23 20 G Anterior;Right Forearm 01/27/23  0855  Forearm  less than 1            Intake/Output Last 24 hours  Intake/Output Summary (Last 24 hours) at 01/27/2023 1317 Last data filed at 01/27/2023 1311 Gross per 24 hour  Intake 400 ml  Output --  Net 400 ml     Labs/Imaging Results for orders placed or performed during the hospital encounter of 01/27/23 (from the past 48 hour(s))  Aerobic Culture w Gram Stain (superficial specimen)     Status: None (Preliminary result)   Collection Time: 01/27/23  8:45 AM   Specimen: Wound  Result Value Ref [place]   Specimen Description WOUND    Special Requests NONE    Gram Stain      MODERATE WBC PRESENT, PREDOMINANTLY PMN RARE GRAM POSITIVE COCCI IN PAIRS Performed at [facility], [place] [address], [place], [place] [address]    Culture PENDING    Report Status PENDING   CBC     Status: Abnormal   Collection Time: 01/27/23  8:46 AM  Result Value Ref [place]   WBC 8.0 4.0 - 10.5 K/uL   RBC 3.78 (L) 4.22 - 5.81 MIL/uL   Hemoglobin 10.9 (L) 13.0 - 17.0 g/dL   HCT 32.8 (L) 39.0 - 52.0 %   MCV 86.8 80.0 - 100.0 fL   MCH 28.8 26.0 - 34.0 pg   MCHC 33.2 30.0 - 36.0 g/dL   RDW 14.2 11.5 - 15.5 %   Platelets 199 150 - 400 K/uL   nRBC 0.0 0.0 - 0.2 %    Comment: Performed at [facility], [place] [address] [place], [place] [address]  Comprehensive metabolic panel     Status: Abnormal   Collection Time: 01/27/23  8:46 AM  Result Value Ref [place]   Sodium 135 135 - 145 mmol/L   Potassium 3.7 3.5 - 5.1 mmol/L   Chloride 104 98 - 111 mmol/L   CO2 20 (L) 22 - 32 mmol/L   Glucose, Bld 229 (H) 70 - 99 mg/dL    Comment: Glucose reference range applies only to samples taken after fasting for at least 8 hours.   BUN 21 8 - 23 mg/dL   Creatinine, Ser 1.05 0.61 - 1.24 mg/dL   Calcium 9.1 8.9 - 10.3 mg/dL   Total Protein 6.5 6.5 - 8.1 g/dL   Albumin 3.3 (L) 3.5 - 5.0 g/dL   AST 18 15 - 41 U/L   ALT 17 0 - 44 U/L   Alkaline Phosphatase 101 38 - 126 U/L   Total Bilirubin 0.5 0.3 - 1.2 mg/dL   GFR, Estimated >60 >60 mL/min    Comment: (NOTE) Calculated using the CKD-EPI Creatinine Equation (2021)    Anion gap 11 5 - 15    Comment: Performed at [facility] [address] [place], [place]  [address]  Protime-INR (order if Patient is taking Coumadin / Warfarin)     Status: None   Collection Time: 01/27/23  8:46 AM  Result Value Ref Range   Prothrombin Time 14.2 11.4 - 15.2 seconds   INR 1.1 0.8 -  1.2    Comment: (NOTE) INR goal varies based on device and disease states. Performed at [facility], [place] [address] [place], [place] [address]   APTT     Status: None   Collection Time: 01/27/23  8:46 AM  Result Value Ref Range   aPTT 32 24 - 36 seconds    Comment: Performed at [facility] [address], [place], [place] [address]  Type and screen [place]     Status: None   Collection Time: 01/27/23  8:46 AM  Result Value Ref Range   ABO/RH(D) B POS    Antibody Screen NEG    Sample Expiration      01/30/2023,2359 Performed at [facility], [place] [address], [place], [place] [address]   Lactate dehydrogenase     Status: Abnormal   Collection Time: 01/27/23  8:46 AM  Result Value Ref Range   LDH 203 (H) 98 - 192 U/L    Comment: Performed at [facility] [address], [place], [place] [address]   CT CHEST ABDOMEN PELVIS WO CONTRAST  Result Date: 01/27/2023 CLINICAL DATA:  Sepsis LVAD driveline infection EXAM: CT CHEST, ABDOMEN AND PELVIS WITHOUT CONTRAST TECHNIQUE: Multidetector CT imaging of the chest, abdomen and pelvis was performed following the standard protocol without IV contrast. RADIATION DOSE REDUCTION: This exam was performed according to the departmental dose-optimization program which includes automated exposure control, adjustment of the mA and/or kV according to patient size and/or use of iterative reconstruction technique. COMPARISON:  CT 06/04/2022, CT 06/14/2022. FINDINGS: CT CHEST FINDINGS Cardiovascular: Stable cardiac size with LVAD in place. No pericardial disease. Normal size main and branch pulmonary arteries. Atrial appendage clip noted. Moderate atherosclerosis of the thoracic aorta. Mediastinum/Nodes: Single  prominent pretracheal lymph node, unchanged from prior exam, likely reactive. No new lymphadenopathy.The thyroid is unremarkable. The trachea is unremarkable. Lungs/Pleura: Moderate centrilobular and paraseptal emphysema, apical predominant. No focal airspace disease. No pleural effusion or pneumothorax. No suspicious pulmonary nodules. Musculoskeletal: And prior median sternotomy. No acute osseous abnormality. No suspicious osseous lesion. CT ABDOMEN PELVIS FINDINGS Hepatobiliary: No focal liver abnormality is seen. The gallbladder is unremarkable. Pancreas: Unremarkable. No pancreatic ductal dilatation or surrounding inflammatory changes. Spleen: Normal in size without focal abnormality. Adrenals/Urinary Tract: Adrenal glands are unremarkable. No hydronephrosis. There is a punctate nonobstructive stone in the left upper pole. The bladder is moderately distended. Stomach/Bowel: The stomach is within normal limits. There is no evidence of bowel obstruction.The appendix is normal. Vascular/Lymphatic: Extensive aortoiliac atherosclerosis. No AAA. No lymphadenopathy. Reproductive: Unremarkable. Other: There is a small amount of fluid along the LVAD driveline in the anterior upper abdomen, with peripheral soft tissue thickening and mild adjacent stranding. Similar amount of fluid but less adjacent stranding in comparison to prior exams. There are postsurgical changes of prior left upper abdomen wound debridement near the driveline insertion. Tiny fat containing ventral hernias below the drive line. Focal subcutaneous stranding of the anterior abdominal wall midline below the umbilicus. Musculoskeletal: No acute osseous abnormality. No suspicious osseous lesion. Prior L5-S1 lumbosacral fusion. IMPRESSION: Small amount of fluid along the LVAD driveline in the upper abdomen, with adjacent soft tissue stranding, compatible with a drive line infection. Postsurgical changes of prior left upper abdominal wound debridement.  Electronically Signed   By: Maurine Simmering M.D.   On: 01/27/2023 10:28   DG Chest Portable 1 View  Result Date: 01/27/2023 CLINICAL DATA:  Provided history: LVAD infection. EXAM: PORTABLE  CHEST 1 VIEW COMPARISON:  Prior chest radiographs 09/29/2022 and earlier. FINDINGS: The cardiomediastinal silhouette is unchanged. Prior median sternotomy/CABG. Left atrial appendage clip. Left ventricular assist device. No appreciable airspace consolidation or pulmonary edema. No evidence of pleural effusion or pneumothorax. No acute osseous abnormality identified. Chronic fracture deformity of the left clavicle. IMPRESSION: No evidence of acute cardiopulmonary abnormality. Aortic Atherosclerosis (ICD10-I70.0). Electronically Signed   By: Kellie Simmering D.O.   On: 01/27/2023 09:14    Pending Labs Unresulted Labs (From admission, onward)     Start     Ordered   01/28/23 0500  Heparin level (unfractionated)  Daily,   R      01/27/23 1306   01/28/23 0500  CBC  Daily,   R      01/27/23 1306   01/27/23 2000  Heparin level (unfractionated)  Once-Timed,   TIMED        01/27/23 1306   01/27/23 0853  Rapid urine drug screen (hospital performed)  ONCE - STAT,   STAT        01/27/23 0852   01/27/23 0853  Urinalysis, Routine w reflex microscopic -Urine, Clean Catch  Once,   URGENT       Question:  Specimen Source  Answer:  Urine, Clean Catch   01/27/23 0852   01/27/23 0845  Blood culture (routine x 2)  BLOOD CULTURE X 2,   R (with STAT occurrences)      01/27/23 0844   Signed and Held  Lactate dehydrogenase  Daily,   R      Signed and Held   Signed and Held  Protime-INR  Daily,   R      Signed and Held   Signed and Held  CBC  Daily,   R      Signed and Held            Vitals/Pain Today's Vitals   01/27/23 0846 01/27/23 0859 01/27/23 1210 01/27/23 1311  BP:   (!) 132/111   Pulse:   (!) 103   Resp:   19   Temp:  (!) 97.4 F (36.3 C)  97.6 F (36.4 C)  TempSrc:  Oral  Oral  SpO2:   98%   Weight: 93.9 kg      Height: 5' 10"$  (1.778 m)     PainSc:        Isolation Precautions No active isolations  Medications Medications  metoprolol succinate (TOPROL-XL) 24 hr tablet 25 mg (25 mg Oral Given 01/27/23 1311)  hydrALAZINE (APRESOLINE) tablet 12.5 mg (has no administration in time range)  vancomycin (VANCOCIN) IVPB 1000 mg/200 mL premix (has no administration in time range)  sacubitril-valsartan (ENTRESTO) 24-26 mg per tablet (1 tablet Oral Given 01/27/23 1311)  meropenem (MERREM) 2 g in sodium chloride 0.9 % 100 mL IVPB (has no administration in time range)  heparin ADULT infusion 100 units/mL (25000 units/281m) (has no administration in time range)  potassium chloride SA (KLOR-CON M) CR tablet 20 mEq (has no administration in time range)  ceFEPIme (MAXIPIME) 2 g in sodium chloride 0.9 % 100 mL IVPB (0 g Intravenous Stopped 01/27/23 1000)  vancomycin (VANCOREADY) IVPB 2000 mg/400 mL (0 mg Intravenous Stopped 01/27/23 1311)    Mobility walks     Focused Assessments Cardiac Assessment Handoff:    Lab Results  Component Value Date   CKTOTAL 59 06/21/2022   Lab Results  Component Value Date   DDIMER 2.01 (H) 11/17/2020   Does the Patient currently have chest  pain? No    R Recommendations: See Admitting Provider Note  Report given to:   Additional Notes:

## 2023-01-27 NOTE — Consult Note (Signed)
    [facility] for Infectious Disease       Reason for Consult: driveline infection    Referring Physician: Dr. 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 Prescott Gum   Assessment: Driveline infection.  I suspect it will be the same but certainly other organisms possible.  Meropenem with better Acinetobacter coverage so will change.     HPI: Kevin Underwood. is a 62 y.o. 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  Seen by Dr. Prescott Gum and plan for debridement and VAC placement tomorrow.  Swab sent for culture and blood cultures sent.    Review of Systems:  Constitutional: negative for fevers and chills All other systems reviewed and are negative    Past Medical History:  Diagnosis Date   "    Arthritis    CAD (coronary artery disease)    a. s/p CABG in 11/2019 with LIMA-LAD, SVG-OM1, SVG-PDA and SVG-D1   CHF (congestive heart failure) ([place])    a. EF < 20% by echo in 11/2019   Essential hypertension    PAF (paroxysmal atrial fibrillation) (H[facility])    Type 2 diabetes mellitus ([place])     Social History   Tobacco Use   Smoking status: Former    Packs/day: 1.00    Years: 30.00    Total pack years: 30.00    Types: Cigarettes    Quit date: 12/20/2019    Years since quitting: 3.1   Smokeless tobacco: Never  Vaping Use   Vaping Use: Never used  Substance Use Topics   Alcohol use: Not Currently    Comment: Prior history of excessive intake   Drug use: No    Family History  Problem Relation Age of Onset   Heart disease Sister        Tumor?   Arthritis Other    Lung disease Other    Asthma Other    Diabetes Other     Thyroid disease Neg Hx     No Known Allergies  Physical Exam: Constitutional: in no apparent distress  Vitals:   01/27/23 0845 01/27/23 0859  BP: (!) 155/115   Pulse: (!) 43   Temp:  (!) 97.4 F (36.3 C)  SpO2: 93%    EYES: anicteric Respiratory: normal respiratory effort  Lab Results  Component Value Date   WBC 8.0 01/27/2023   HGB 10.9 (L) 01/27/2023   HCT 32.8 (L) 01/27/2023   MCV 86.8 01/27/2023   PLT 199 01/27/2023    Lab Results  Component Value Date   CREATININE 1.05 01/27/2023   BUN 21 01/27/2023   NA 135 01/27/2023   K 3.7 01/27/2023   CL 104 01/27/2023   CO2 20 (L) 01/27/2023    Lab Results  Component Value Date   ALT 17 01/27/2023   AST 18 01/27/2023   ALKPHOS 101 01/27/2023     Microbiology: No results found for this or any previous visit (from the past 240 hour(s)).  Thayer Headings, MD [facility] [email or web address] 01/27/2023, 12:00 PM

## 2023-01-27 NOTE — Progress Notes (Signed)
ANTICOAGULATION CONSULT NOTE - Initial Consult  Pharmacy Consult for Heparin Indication: LVAD  No Known Allergies  Patient Measurements: Height: 5' 10"$  (177.8 cm) Weight: 93.9 kg (207 lb) IBW/kg (Calculated) : 73   Vital Signs: Temp: 97.4 F (36.3 C) (02/19 0859) Temp Source: Oral (02/19 0859) BP: 132/111 (02/19 1210) Pulse Rate: 103 (02/19 1210)  Labs: Recent Labs    01/27/23 0846  HGB 10.9*  HCT 32.8*  PLT 199  APTT 32  LABPROT 14.2  INR 1.1  CREATININE 1.05    Estimated Creatinine Clearance: 84 mL/min (by C-G formula based on SCr of 1.05 mg/dL).   Medical History: Past Medical History:  Diagnosis Date   "    Arthritis    CAD (coronary artery disease)    a. s/p CABG in 11/2019 with LIMA-LAD, SVG-OM1, SVG-PDA and SVG-D1   CHF (congestive heart failure) (HCC)    a. EF < 20% by echo in 11/2019   Essential hypertension    PAF (paroxysmal atrial fibrillation) (H[facility])    Type 2 diabetes mellitus (H[facility])     Medications:   meropenem (MERREM) IV     vancomycin      Assessment: 62 yo male on chronic Coumadin for LVAD, poor compliance with outpatient checks.  INR on admission 1.1.  Previous Coumadin dosen 6 mg daily except 4 mg on Tues, Thurs.  Holding Coumadin for OR tomorrow.  Discussed with Dr. Prescott Gum, will start heparin at low-fixed dose for now.  Goal of Therapy:  INR 2-2.5 Monitor platelets by anticoagulation protocol: Yes   Plan:  Start IV heparin at 500 units/hr with no bolus. Check heparin level 6 hrs after initiation to ensure not supratherapeutic.  Will not up-titrate before OR.  [place] Crane, Roylene Reason, BCCP Clinical Pharmacist  01/27/2023 1:01 PM   [facility] pharmacy phone numbers are listed on [email or web address]

## 2023-01-27 NOTE — Plan of Care (Signed)
  Problem: Education: Goal: Patient will understand all VAD equipment and how it functions Outcome: Progressing Goal: Patient will be able to verbalize current INR target range and antiplatelet therapy for discharge home Outcome: Progressing   Problem: Cardiac: Goal: LVAD will function as expected and patient will experience no clinical alarms Outcome: Progressing   Problem: Education: Goal: Knowledge of General Education information will improve Description: Including pain rating scale, medication(s)/side effects and non-pharmacologic comfort measures Outcome: Progressing   Problem: Health Behavior/Discharge Planning: Goal: Ability to manage health-related needs will improve Outcome: Progressing   Problem: Clinical Measurements: Goal: Ability to maintain clinical measurements within normal limits will improve Outcome: Progressing Goal: Will remain free from infection Outcome: Not Progressing Goal: Diagnostic test results will improve Outcome: Progressing Goal: Respiratory complications will improve Outcome: Progressing Goal: Cardiovascular complication will be avoided Outcome: Progressing   Problem: Activity: Goal: Risk for activity intolerance will decrease Outcome: Progressing   Problem: Nutrition: Goal: Adequate nutrition will be maintained Outcome: Progressing   Problem: Coping: Goal: Level of anxiety will decrease Outcome: Not Progressing   Problem: Elimination: Goal: Will not experience complications related to bowel motility Outcome: Progressing Goal: Will not experience complications related to urinary retention Outcome: Progressing   Problem: Pain Managment: Goal: General experience of comfort will improve Outcome: Not Progressing   Problem: Safety: Goal: Ability to remain free from injury will improve Outcome: Progressing   Problem: Skin Integrity: Goal: Risk for impaired skin integrity will decrease Outcome: Progressing

## 2023-01-27 NOTE — H&P (Addendum)
Advanced Heart Failure VAD History and Physical Note   PCP-Cardiologist: Rozann Lesches, MD   Reason for Admission:LVAD complication   HPI:    Mr Mastandrea is a 62 y.o. 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 type 2 diabetes mellitus, COPD, and hypertension. Now s/p Heartmate 3 LVAD.    Patient had echo in 10/21 with EF 20-25%.  He was admitted in 12/21 with cardiogenic shock.  Cath showed patent grafts and low cardiac output.  Suspect mixed ischemic/nonischemic cardiomyopathy, prior heavy ETOH though no ETOH or smoking since 12/20 CABG.  He required stabilization with Impella 5.5 and ultimately had placement of Heartmate 3 LVAD.  Paroxysmal atrial fibrillation noted post-op, but generally stable course and discharged home.     Patient was admitted in 10/22 with COPD exacerbation.  Wilder Glade was stopped with frequent PIs.  He was treated with steroids, nebs, antibiotics with improvement.    Ramp echo was done in 2/23.  At 5600 rpm the LV was small and the septum with some left shift, aortic valve opening 3/5 beats.  At 5500 rpm, the LV was larger and septum more midline, aortic valve still opening 3/5 beats.  The RV was moderately enlarged with normal systolic function. I left speed at 5500 rpm.    Patient had a prolonged admission in 6/23 and again in 7/23-8/23 with MRSA driveline infection.  He went to the OR multiple times for debridement and wound vac changes.  He completed vancomycin and was placed on doxycycline.    Levofloxacin added d/t increased drainage and redness after last f/u on 09/27. Wound culture grew acinetobacter.  Wound initially improved but cultures repeated d/t decreased healing over last few dressing changes. Levofloxacin recently stopped and back on doxy.  Grew acinetobacter and MRSA on culture. ICD  recommended switching to minocycline 200 BID based on culture results.   Patient was admitted with swollen and erythematous right wrist in 10/23.  There was concern for septic arthritis. CT showed no effusion that could be tapped.  Suspect this was likely a gout flare as it improved rapidly with colchicine.    Off all medications at least 3 days. Some question about where his medications are. He no longer lives with his wife. He has been living with his Mom. Says he has been on a meth bender for the last 5 days. Over the last day or 2 he noticed increased odor and drainage around driveline.   Presented to ED with suspected driveline infection. Copious purulent drainage around driveline. INR 1.1, WBC 8, Hgb 10.9, creatinine 1.05, and LDH 203. CT abd--> small amount of fluid along LVAD driveline in upper abdomin. Blood CX drawn.   LVAD INTERROGATION:  HeartMate III LVAD:  Flow 3.9  liters/min, speed 5600, power 3.9, PI 5.9 .     Review of Systems: [y] = yes, [ ]$  = no   General: Weight gain [ ]$ ; Weight loss [ ]$ ; Anorexia [ ]$ ; Fatigue [ Y]; Fever [ ]$ ; Chills [ ]$ ; Weakness [ ]$   Cardiac: Chest pain/pressure [ ]$ ; Resting SOB [ ]$ ; Exertional SOB [ ]$ ; Orthopnea [ ]$ ; Pedal Edema [ ]$ ; Palpitations [ ]$ ; Syncope [ ]$ ; Presyncope [ ]$ ; Paroxysmal nocturnal dyspnea[ ]$   Pulmonary: Cough [ ]$ ; Wheezing[ ]$ ; Hemoptysis[ ]$ ; Sputum [ ]$ ; Snoring [ ]$   GI: Vomiting[ ]$ ; Dysphagia[ ]$ ; Melena[ ]$ ; Hematochezia [ ]$ ;  Heartburn[ ]$ ; Abdominal pain [ Y]; Constipation [ ]$ ; Diarrhea [ ]$ ; BRBPR [ ]$   GU: Hematuria[ ]$ ; Dysuria [ ]$ ; Nocturia[ ]$   Vascular: Pain in legs with walking [ ]$ ; Pain in feet with lying flat [ ]$ ; Non-healing sores [ ]$ ; Stroke [ ]$ ; TIA [ ]$ ; Slurred speech [ ]$ ;  Neuro: Headaches[ ]$ ; Vertigo[ ]$ ; Seizures[ ]$ ; Paresthesias[ ]$ ;Blurred vision [ ]$ ; Diplopia [ ]$ ; Vision changes [ ]$   Ortho/Skin: Arthritis [ ]$ ; Joint pain [ Y]; Muscle pain [ ]$ ; Joint swelling [ ]$ ; Back Pain [Y ]; Rash [ ]$   Psych: Depression[Y ]; Anxiety[ ]$    Heme: Bleeding problems [ ]$ ; Clotting disorders [ ]$ ; Anemia [ ]$   Endocrine: Diabetes [ ]$ ; Thyroid dysfunction[ ]$     Home Medications Prior to Admission medications   Medication Sig Start Date End Date Taking? Authorizing Provider  albuterol (PROVENTIL) (2.5 MG/3ML) 0.083% nebulizer solution INHALE 3 ML BY NEBULIZATION EVERY 6 HOURS AS NEEDED FOR WHEEZING OR SHORTNESS OF BREATH 09/10/22   Larey Dresser, MD  albuterol (VENTOLIN HFA) 108 (90 Base) MCG/ACT inhaler Inhale 2 puffs into the lungs every 6 (six) hours as needed for wheezing or shortness of breath.    [provider]  allopurinol (ZYLOPRIM) 100 MG tablet Take 1 tablet (100 mg total) by mouth daily. Patient not taking: Reported on 01/15/2023 12/04/21   Larey Dresser, MD  blood glucose meter kit and supplies KIT Dispense based on patient and insurance preference. Use to check CBG's three times a day. (FOR ICD-9 250.00, 250.01). 09/25/20   Barton Dubois, MD  colchicine 0.6 MG tablet Take 1 tablet (0.6 mg total) by mouth as needed. 10/07/22   Larey Dresser, MD  COMBIVENT RESPIMAT 20-100 MCG/ACT AERS respimat Inhale 1 puff into the lungs every 6 (six) hours. 09/10/22   [provider]  enoxaparin (LOVENOX) 40 MG/0.4ML injection Inject 0.4 mLs (40 mg total) into the skin every 12 (twelve) hours. Patient not taking: Reported on 01/15/2023 10/07/22   Larey Dresser, MD  fluticasone [facility] ALLERGY RELIEF) 50 MCG/ACT nasal spray Place 2 sprays into both nostrils as needed for allergies or rhinitis.    [provider]  fluticasone furoate-vilanterol (BREO ELLIPTA) 100-25 MCG/ACT AEPB Inhale 1 puff into the lungs daily.    [provider]  furosemide (LASIX) 40 MG tablet Take 0.5 tablets (20 mg total) by mouth daily as needed. 01/15/23   Larey Dresser, MD  gabapentin (NEURONTIN) 600 MG tablet Take 1 tablet (600 mg total) by mouth 3 (three) times daily. 10/03/22   Clegg, Amy D, NP  glipiZIDE (GLUCOTROL) 5  MG tablet Take 1 tablet (5 mg total) by mouth daily. 06/03/22 06/03/23  Clegg, Amy D, NP  guaiFENesin (MUCINEX) 600 MG 12 hr tablet Take 1 tablet (600 mg total) by mouth 2 (two) times daily. 09/11/21   Clegg, Amy D, NP  hydrALAZINE (APRESOLINE) 25 MG tablet Take 0.5 tablets (12.5 mg total) by mouth 3 (three) times daily. 09/04/22 05/02/23  Larey Dresser, MD  loratadine (CLARITIN) 10 MG tablet Take 1 tablet (10 mg total) by mouth daily. 09/12/21   Clegg, Amy D, NP  metFORMIN (GLUCOPHAGE) 500 MG tablet TAKE 1 TABLET BY MOUTH 2 TIMES DAILY WITH A MEAL. 12/05/22   Cassandria Anger, MD  methimazole (TAPAZOLE) 5 MG tablet TAKE 1 TABLET BY MOUTH EVERY DAY WITH BREAKFAST Patient taking differently: Take 5 mg by mouth See admin instructions. Take one tablet (5 mg) by mouth  every day with breakfast. 08/22/22   Cassandria Anger, MD  metoprolol succinate (TOPROL XL) 25 MG 24 hr tablet Take 1 tablet (25 mg total) by mouth daily. 01/09/22   Larey Dresser, MD  minocycline (MINOCIN) 100 MG capsule Take 2 capsules (200 mg total) by mouth 2 (two) times daily. 09/30/22   [place] Callas, NP  Multiple Vitamin (MULTIVITAMIN WITH MINERALS) TABS tablet Take 1 tablet by mouth daily. 12/09/19   Gold, Wayne E, PA-C  pantoprazole (PROTONIX) 40 MG tablet TAKE 1 TABLET BY MOUTH EVERY DAY Patient taking differently: Take 40 mg by mouth daily. 11/13/21   Larey Dresser, MD  rosuvastatin (CRESTOR) 20 MG tablet Take 20 mg by mouth daily.    [provider]  sacubitril-valsartan (ENTRESTO) 24-26 MG Take 1 tablet by mouth daily. 01/15/23   Larey Dresser, MD  sertraline (ZOLOFT) 50 MG tablet Take 1 tablet (50 mg total) by mouth daily. 12/04/21   Larey Dresser, MD  tamsulosin (FLOMAX) 0.4 MG CAPS capsule TAKE 1 CAPSULE BY MOUTH EVERY DAY 11/11/22   Larey Dresser, MD  Tetrahydrozoline HCl (REDNESS RELIEVER EYE DROPS OP) Apply 2 drops to eye daily as needed.    [provider]  traMADol (ULTRAM) 50 MG  tablet Take 1 tablet (50 mg total) by mouth 2 (two) times daily as needed. Patient not taking: Reported on 01/15/2023 10/03/22   Darrick Grinder D, NP  traZODone (DESYREL) 150 MG tablet Take 1 tablet (150 mg total) by mouth at bedtime. 03/11/22   Larey Dresser, MD  TRELEGY ELLIPTA 100-62.5-25 MCG/ACT AEPB Inhale 1 puff into the lungs daily. 09/10/22   [provider]  warfarin (COUMADIN) 4 MG tablet On 10/5 take 4 mg then resume 6 mg (1.5 tabs) on Mon/Wed/Fri and 4 mg (1 tab) all other days or as directed by HF Clinic Patient taking differently: See admin instructions. Take one and a half tablets (6 mg) M, W, F, and one tablet (4 mg) all other days. 09/12/21   Darrick Grinder D, NP    Past Medical History: Past Medical History:  Diagnosis Date   "    Arthritis    CAD (coronary artery disease)    a. s/p CABG in 11/2019 with LIMA-LAD, SVG-OM1, SVG-PDA and SVG-D1   CHF (congestive heart failure) (HCC)    a. EF < 20% by echo in 11/2019   Essential hypertension    PAF (paroxysmal atrial fibrillation) (HCC)    Type 2 diabetes mellitus ([place])     Past Surgical History: Past Surgical History:  Procedure Laterality Date   APPLICATION OF WOUND VAC Left 06/14/2022   Procedure: APPLICATION OF WOUND VAC;  Surgeon: [name], MD;  Location: [place];  Service: Thoracic;  Laterality: Left;   APPLICATION OF WOUND VAC N/A 06/20/2022   Procedure: VAD POWERCORD TUNNEL WOUND [place];  Surgeon: [name], MD;  Location: [place];  Service: Thoracic;  Laterality: N/A;   APPLICATION OF WOUND VAC N/A 06/27/2022   Procedure: WOUND VAC CHANGE;  Surgeon: [name], MD;  Location: [place];  Service: Thoracic;  Laterality: N/A;   APPLICATION OF WOUND VAC N/A 07/08/2022   Procedure: WOUND VAC CHANGE;  Surgeon: [name], MD;  Location: [place];  Service: Thoracic;  Laterality: N/A;   BACK SURGERY     CLIPPING OF ATRIAL APPENDAGE N/A 11/26/2019   Procedure: Clipping Of Atrial Appendage using AtriCure 40  Clip;  Surgeon: Wonda Olds, MD;  Location:  [place] [facility];  Service: Open Heart Surgery;  Laterality: N/A;   CORONARY ARTERY BYPASS GRAFT N/A 11/26/2019   Procedure: CORONARY ARTERY BYPASS GRAFTING (CABG) using endoscopic greater saphenous vein harvest: svc to OM; svc to Diag; svc to PD; and LIMA to LAD.;  Surgeon: Wonda Olds, MD;  Location: [place];  Service: Open Heart Surgery;  Laterality: N/A;   FOOT SURGERY     HAND SURGERY     INSERTION OF IMPLANTABLE LEFT VENTRICULAR ASSIST DEVICE N/A 11/17/2020   Procedure: INSERTION OF IMPLANTABLE LEFT VENTRICULAR ASSIST DEVICE - HM3;  Surgeon: Wonda Olds, MD;  Location: [place];  Service: Open Heart Surgery;  Laterality: N/A;   INTRAOPERATIVE TRANSESOPHAGEAL ECHOCARDIOGRAM  12/04/2019   Procedure: Intraoperative Transesophageal Echocardiogram;  Surgeon: Wonda Olds, MD;  [facility];  Service: Open Heart Surgery;;   MAZE N/A 11/26/2019   Procedure: MAZE using Bilateral Pulmonary Vein isolation.;  Surgeon: Wonda Olds, MD;  Location: [place];  Service: Open Heart Surgery;  Laterality: N/A;   MULTIPLE EXTRACTIONS WITH ALVEOLOPLASTY N/A 11/16/2020   Procedure: MULTIPLE EXTRACTION WITH ALVEOLOPLASTY;  Surgeon: Charlaine Julyan Gales, DMD;  Location: [place];  Service: Dentistry;  Laterality: N/A;   PLACEMENT OF IMPELLA LEFT VENTRICULAR ASSIST DEVICE N/A 11/24/2019   Procedure: PLACEMENT OF IMPELLA 5.5 LEFT VENTRICULAR ASSIST DEVICE;  Surgeon: Wonda Olds, MD;  Location: [place];  Service: Open Heart Surgery;  Laterality: N/A;   PLACEMENT OF IMPELLA LEFT VENTRICULAR ASSIST DEVICE Left 11/10/2020   Procedure: PLACEMENT OF IMPELLA 5.5 LEFT VENTRICULAR ASSIST DEVICE VIA  LEFT AVILLARY ARTERY;  Surgeon: Wonda Olds, MD;  Location: [place];  Service: Open Heart Surgery;  Laterality: Left;   REMOVAL OF IMPELLA LEFT VENTRICULAR ASSIST DEVICE Right 12/04/2019   Procedure: REMOVAL OF IMPELLA LEFT VENTRICULAR ASSIST DEVICE, right axilla;  Surgeon:  Wonda Olds, MD;  Location: [place];  Service: Open Heart Surgery;  Laterality: Right;   RIGHT/LEFT HEART CATH AND CORONARY ANGIOGRAPHY N/A 11/24/2019   Procedure: RIGHT/LEFT HEART CATH AND CORONARY ANGIOGRAPHY;  Surgeon: Larey Dresser, MD;  Location: [place] [facility];  Service: Cardiovascular;  Laterality: N/A;   RIGHT/LEFT HEART CATH AND CORONARY/GRAFT ANGIOGRAPHY N/A 11/09/2020   Procedure: RIGHT/LEFT HEART CATH AND CORONARY/GRAFT ANGIOGRAPHY;  Surgeon: Nelva Bush, MD;  Location: [place] [facility];  Service: Cardiovascular;  Laterality: N/A;   STERNAL WOUND DEBRIDEMENT N/A 05/16/2022   Procedure: VAD DRIVELINE DEBRIDEMENT;  Surgeon: [name], MD;  Location: [place];  Service: Thoracic;  Laterality: N/A;  VANC PULSE LAVAGE   STERNAL WOUND DEBRIDEMENT Left 06/14/2022   Procedure: DEBRIDEMENT OF VAD POWERCORD TUNNEL;  Surgeon: [name], MD;  Location: [place];  Service: Thoracic;  Laterality: Left;   STERNAL WOUND DEBRIDEMENT N/A 06/27/2022   Procedure: ABDOMINAL WOUND DEBRIDEMENT, APPLICATION OF [place];  Surgeon: [name], MD;  Location: [place];  Service: Thoracic;  Laterality: N/A;   STERNAL WOUND DEBRIDEMENT N/A 07/08/2022   Procedure: ABDOMINAL WOUND DEBRIDEMENT;  Surgeon: [name], MD;  Location: [place];  Service: Thoracic;  Laterality: N/A;   TEE WITHOUT CARDIOVERSION N/A 11/24/2019   Procedure: TRANSESOPHAGEAL ECHOCARDIOGRAM (TEE);  Surgeon: Wonda Olds, MD;  Location: [place];  Service: Open Heart Surgery;  Laterality: N/A;   TEE WITHOUT CARDIOVERSION N/A 11/26/2019   Procedure: TRANSESOPHAGEAL ECHOCARDIOGRAM (TEE);  Surgeon: Wonda Olds, MD;  Location: [place];  Service: Open Heart Surgery;  Laterality: N/A;   TEE WITHOUT CARDIOVERSION N/A 11/10/2020   Procedure:  TRANSESOPHAGEAL ECHOCARDIOGRAM (TEE);  Surgeon: Wonda Olds, MD;  Location: [place];  Service: Open Heart Surgery;  Laterality: N/A;   TEE WITHOUT CARDIOVERSION N/A 11/17/2020    Procedure: TRANSESOPHAGEAL ECHOCARDIOGRAM (TEE);  Surgeon: Wonda Olds, MD;  Location: [place];  Service: Open Heart Surgery;  Laterality: N/A;   WOUND EXPLORATION N/A 05/31/2022   Procedure: WOUND EXPLORATION, Debridement and Irrigation of abdominal wound;  Surgeon: [name], MD;  [facility];  Service: Thoracic;  Laterality: N/A;    Family History: Family History  Problem Relation Age of Onset   Heart disease Sister        Tumor?   Arthritis Other    Lung disease Other    Asthma Other    Diabetes Other    Thyroid disease Neg Hx     Social History: Social History   Socioeconomic History   Marital status: Married    Spouse name: Not on file   Number of children: Not on file   Years of education: 12   Highest education level: Not on file  Occupational History   Not on file  Tobacco Use   Smoking status: Former    Packs/day: 1.00    Years: 30.00    Total pack years: 30.00    Types: Cigarettes    Quit date: 12/20/2019    Years since quitting: 3.1   Smokeless tobacco: Never  Vaping Use   Vaping Use: Never used  Substance and Sexual Activity   Alcohol use: Not Currently    Comment: Prior history of excessive intake   Drug use: No   Sexual activity: Not on file  Other Topics Concern   Not on file  Social History Narrative   Not on file   Social Determinants of Health   Financial Resource Strain: Medium Risk (05/17/2022)   Overall Financial Resource Strain (CARDIA)    Difficulty of Paying Living Expenses: Somewhat hard  Food Insecurity: No Food Insecurity (09/10/2021)   Hunger Vital Sign    Worried About Running Out of Food in the Last Year: Never true    Ran Out of Food in the Last Year: Never true  Transportation Needs: Unmet Transportation Needs (09/10/2021)   PRAPARE - Hydrologist (Medical): Yes    Lack of Transportation (Non-Medical): No  Physical Activity: Inactive (11/10/2020)   Exercise Vital Sign    Days of  Exercise per Week: 0 days    Minutes of Exercise per Session: 0 min  Stress: Not on file  Social Connections: Not on file    Allergies:  No Known Allergies  Objective:    Vital Signs:   Pulse Rate:  [41-43] 43 (02/19 0845) BP: (151-155)/(115-122) 155/115 (02/19 0845) SpO2:  [86 %-93 %] 93 % (02/19 0845) Weight:  [93.9 kg] 93.9 kg (02/19 0846)   Filed Weights   01/27/23 0846  Weight: 93.9 kg    Mean arterial Pressure 110  Physical Exam    General:  Well appearing. No resp difficulty HEENT: Normal Neck: supple. JVPflat . Carotids 2+ bilat; no bruits. No lymphadenopathy or thyromegaly appreciated. Cor: Mechanical heart sounds with LVAD hum present. Lungs: Clear Abdomen: soft, tender, nondistended. No hepatosplenomegaly. No bruits or masses. Good bowel sounds. Driveline: copious pus around drivline.Periwound erythematous.  Extremities: no cyanosis, clubbing, rash, edema Neuro: alert & orientedx3, cranial nerves grossly intact. moves all 4 extremities w/o difficulty. Affect pleasant   Telemetry   SR  EKG   NA  Labs  Basic Metabolic Panel: No results for input(s): "NA", "K", "CL", "CO2", "GLUCOSE", "BUN", "CREATININE", "CALCIUM", "MG", "PHOS" in the last 168 hours.  Liver Function Tests: No results for input(s): "AST", "ALT", "ALKPHOS", "BILITOT", "PROT", "ALBUMIN" in the last 168 hours. No results for input(s): "LIPASE", "AMYLASE" in the last 168 hours. No results for input(s): "AMMONIA" in the last 168 hours.  CBC: No results for input(s): "WBC", "NEUTROABS", "HGB", "HCT", "MCV", "PLT" in the last 168 hours.  Cardiac Enzymes: No results for input(s): "CKTOTAL", "CKMB", "CKMBINDEX", "TROPONINI" in the last 168 hours.  BNP: BNP (last 3 results) Recent Labs    05/17/22 0153  BNP 222.1*    ProBNP (last 3 results) No results for input(s): "PROBNP" in the last 8760 hours.   CBG: No results for input(s): "GLUCAP" in the last 168 hours.  Coagulation  Studies: No results for input(s): "LABPROT", "INR" in the last 72 hours.  Other results: EKG: .   Imaging    No results found.    Patient Profile:  Mr Neenan is a 62 y.o. 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 type 2 diabetes mellitus, COPD, hypertension, and HMIII VAD.   Admitted with recurrent LVAD complication driveline iction.   Assessment/Plan:    1. LVAD Complication---> Recurrent driveline infection  MRSA driveline infection, admitted in 6/23 and again in 7/23-8/23 with multiple trips to the OR for debridement. He has completed vancomycin and was on long-term doxycycline. Most recent culture grew acinetobacter and MRSA. ID recommended minocycline. He has been off minocycline for at least 3 days.  - Copious drainage around driveline.  - CT Surgery/ID team consulted. Return to OR for debridement tomorrow. INR 1.1.  -  Blood cultures obtained. Start Vanc and Cefepime.   2. Chronic HFrEF/ cho 10/21 with EF 20-25%, mildly decreased RV function. LHC/RHC in 12/21 with patent grafts, low output. Suspect mixed ischemic/nonischemic cardiomyopathy (prior heavy ETOH and drugs as well as CAD).  No ETOH, drugs, smoking since CABG in 12/20. Admitted with cardiogenic shock in 12/21, had placement of Impella 5.5 initially, now s/p Heartmate 3 LVAD on 11/17/20.  Ramp echo 2/23 with speed decreased to 5500 rpm  -Volume status stable.  Start heparin drip. No coumadin for now. INR 1.1 - LDH 203. Follow daily.  - Not a candidate for transplant with ongoing drug use.   3. HTN  -Restart home HTN meds  - Toprol XL 25 mg daily  -Hydralazine 12.5 mg three times a day.  - Entresto 24-26 mg twcie a day.   4. Polysubstance Abuse -Recently started using meth again. -Check UDS  5. PAF - S/p Maze and LA appendage clip with CABG  in 12/20.   -Adding Heparin drip.   6. COPD Continue Trelegy  7. DMII Start SSI   I reviewed the LVAD parameters from today, and compared the results to the patient's prior recorded data.  No programming changes were made.  The LVAD is functioning within specified parameters.  The patient performs LVAD self-test daily.  LVAD interrogation was negative for any significant power changes, alarms or PI events/speed drops.  LVAD equipment check completed and is in good working order.  Back-up equipment present.   LVAD education done on emergency procedures and precautions and reviewed exit site care.  Length of Stay: 0  Darrick Grinder, NP 01/27/2023, 8:54 AM  VAD Team  Pager [phone] (7am - 7am) +++VAD ISSUES ONLY+++   Advanced Heart Failure Team Pager [phone] (M-F; [place])  Please contact [facility] for night-coverage after hours (5p -7a ) and weekends on [email or web address] for all non- LVAD Issues\  Patient seen with NP, agree with the above note.    Patient has not taken any meds for 3-4 days.  He has been using methamphetamine.  He came to the ER because of shortness of breath and driveline drainage/odor.  CT chest/abd/pelvis showed stranding around the driveline and small amount of fluid.   INR 1.1, has not been taking warfarin.   He is wheezing and short of breath.   General: Well appearing this am. NAD.  HEENT: Normal. Neck: Supple, JVP 8-9 cm. Carotids OK.  Cardiac:  Mechanical heart sounds with LVAD hum present.  Lungs:  Distant BS with wheezes Abdomen:  NT, ND, no HSM. No bruits or masses. +BS  LVAD exit site: profuse drainage. Extremities:  Warm and dry. No cyanosis, clubbing, rash, or edema.  Neuro:  Alert & oriented x 3. Cranial nerves grossly intact. Moves all 4 extremities w/o difficulty. Affect pleasant    Wheezing diffusely, suspect COPD exacerbation though may have some volume overload.  - Will treat with Duonebs q6hrs.  - Will see if nebs calm his symptoms, if not can  add steroids.  Would like to avoid if possible given driveline infection and need for debridement.  - Lasix 40 mg IV x 1.   MAP around 110, has been off BP-active meds.  Restarting today.   LVAD parameters stable. Cover with heparin gtt while off warfarin.   Recurrent driveline infection, on minocycline chronically at home for MRSA but apparently off for several days. INR 1.1. CT with small amt fluid around DL.  - To OR tomorrow for debridement.  - Blood, wound cultures.  - Cover with vancomycin/meropenem per ID   Ongoing methamphetamine abuse, will enlist social worker to see if rehab is an option.   Loralie Champagne 01/27/2023 1:55 PM

## 2023-01-27 NOTE — Progress Notes (Signed)
CSW met with patient at bedside. Patient began sharing multiple conflicts/family issues, admitted to using meth and states that he "was kicked out" of his home. Patient continually blaming others in his family for his current situation. CSW provided supportive listening and informed patient that there are limited resources for inpatient substance use due to the LVAD.Patient will reflect on conversation with CSW and discuss further tomorrow about his options and consider drug rehab. Raquel Sarna, [place], [place]

## 2023-01-27 NOTE — Progress Notes (Signed)
Pharmacy Antibiotic Note  Kevin Underwood. is a 62 y.o. male with hx HF s/p LVAD admitted on 01/27/2023 with cellulitis.  Pharmacy has been consulted for vancomycin/cefepime dosing. SCr 1.05 on admit.  Plan: Cefepime 2g IV x 1; then 2g IV q8h Vancomycin 2g IV x 1; then 1g IV q12h. Goal AUC 400-550. Expected AUC: 442 SCr used: 1.05 Monitor clinical progress, c/s, renal function F/u de-escalation plan/LOT, vancomycin levels as indicated   Height: 5' 10"$  (177.8 cm) Weight: 93.9 kg (207 lb) IBW/kg (Calculated) : 73  No data recorded.  No results for input(s): "WBC", "CREATININE", "LATICACIDVEN", "VANCOTROUGH", "VANCOPEAK", "VANCORANDOM", "GENTTROUGH", "GENTPEAK", "GENTRANDOM", "TOBRATROUGH", "TOBRAPEAK", "TOBRARND", "AMIKACINPEAK", "AMIKACINTROU", "AMIKACIN" in the last 168 hours.  Estimated Creatinine Clearance: 62.1 mL/min (A) (by C-G formula based on SCr of 1.42 mg/dL (H)).    No Known Allergies  Antimicrobials this admission: 2/19 vancomycin >>  2/19 cefepime >>   Dose adjustments this admission:   Microbiology results:   Arturo Morton, PharmD, BCPS Please check AMION for all [place] contact numbers Clinical Pharmacist 01/27/2023 8:59 AM

## 2023-01-27 NOTE — ED Triage Notes (Signed)
Pt BIB [facility] from home d/t infection he noticed in his LVAD site. He has had LVAD for 2 years & reports there has always been a "knot" & some "redness" at the site but this morning he could smell it, denies pain. A/Ox4 EMS reports VSS.

## 2023-01-27 NOTE — ED Notes (Signed)
Pt has redness and pus coming out of the LVAD site upon inspection.  Cardiologist and LVAD RN at bedside.  No new orders at this time.

## 2023-01-28 ENCOUNTER — Inpatient Hospital Stay (HOSPITAL_COMMUNITY): Payer: 59 | Admitting: Certified Registered Nurse Anesthetist

## 2023-01-28 ENCOUNTER — Other Ambulatory Visit: Payer: Self-pay

## 2023-01-28 ENCOUNTER — Encounter (HOSPITAL_COMMUNITY)
Admission: EM | Disposition: A | Discharge status: Skilled Nursing Facility | Payer: Self-pay | Source: Ambulatory Visit | Attending: Cardiology

## 2023-01-28 ENCOUNTER — Encounter (HOSPITAL_COMMUNITY): Payer: Self-pay | Admitting: Cardiology

## 2023-01-28 DIAGNOSIS — I25119 Atherosclerotic heart disease of native coronary artery with unspecified angina pectoris: Secondary | ICD-10-CM

## 2023-01-28 DIAGNOSIS — Z95811 Presence of heart assist device: Secondary | ICD-10-CM | POA: Diagnosis not present

## 2023-01-28 DIAGNOSIS — T829XXD Unspecified complication of cardiac and vascular prosthetic device, implant and graft, subsequent encounter: Secondary | ICD-10-CM

## 2023-01-28 DIAGNOSIS — I110 Hypertensive heart disease with heart failure: Secondary | ICD-10-CM

## 2023-01-28 DIAGNOSIS — T829XXA Unspecified complication of cardiac and vascular prosthetic device, implant and graft, initial encounter: Secondary | ICD-10-CM | POA: Diagnosis not present

## 2023-01-28 DIAGNOSIS — I509 Heart failure, unspecified: Secondary | ICD-10-CM | POA: Diagnosis not present

## 2023-01-28 DIAGNOSIS — T827XXA Infection and inflammatory reaction due to other cardiac and vascular devices, implants and grafts, initial encounter: Secondary | ICD-10-CM

## 2023-01-28 DIAGNOSIS — Z87891 Personal history of nicotine dependence: Secondary | ICD-10-CM

## 2023-01-28 HISTORY — PX: APPLICATION OF WOUND VAC: SHX5189

## 2023-01-28 HISTORY — PX: STERNAL WOUND DEBRIDEMENT: SHX1058

## 2023-01-28 LAB — CBC
HCT: 30.3 % — ABNORMAL LOW (ref 39.0–52.0)
Hemoglobin: 10.5 g/dL — ABNORMAL LOW (ref 13.0–17.0)
MCH: 29.7 pg (ref 26.0–34.0)
MCHC: 34.7 g/dL (ref 30.0–36.0)
MCV: 85.6 fL (ref 80.0–100.0)
Platelets: 189 10*3/uL (ref 150–400)
RBC: 3.54 MIL/uL — ABNORMAL LOW (ref 4.22–5.81)
RDW: 14.3 % (ref 11.5–15.5)
WBC: 5.9 10*3/uL (ref 4.0–10.5)
nRBC: 0 % (ref 0.0–0.2)

## 2023-01-28 LAB — BASIC METABOLIC PANEL
Anion gap: 12 (ref 5–15)
BUN: 27 mg/dL — ABNORMAL HIGH (ref 8–23)
CO2: 21 mmol/L — ABNORMAL LOW (ref 22–32)
Calcium: 8.5 mg/dL — ABNORMAL LOW (ref 8.9–10.3)
Chloride: 102 mmol/L (ref 98–111)
Creatinine, Ser: 1.37 mg/dL — ABNORMAL HIGH (ref 0.61–1.24)
GFR, Estimated: 58 mL/min — ABNORMAL LOW (ref >60)
Glucose, Bld: 175 mg/dL — ABNORMAL HIGH (ref 70–99)
Potassium: 3.9 mmol/L (ref 3.5–5.1)
Sodium: 135 mmol/L (ref 135–145)

## 2023-01-28 LAB — GLUCOSE, CAPILLARY
Glucose-Capillary: 210 mg/dL — ABNORMAL HIGH (ref 70–99)
Glucose-Capillary: 136 mg/dL — ABNORMAL HIGH (ref 70–99)
Glucose-Capillary: 158 mg/dL — ABNORMAL HIGH (ref 70–99)
Glucose-Capillary: 141 mg/dL — ABNORMAL HIGH (ref 70–99)
Glucose-Capillary: 121 mg/dL — ABNORMAL HIGH (ref 70–99)

## 2023-01-28 LAB — HEPARIN LEVEL (UNFRACTIONATED): Heparin Unfractionated: <0.1 IU/mL — ABNORMAL LOW (ref 0.30–0.70)

## 2023-01-28 LAB — MAGNESIUM: Magnesium: 1.9 mg/dL (ref 1.7–2.4)

## 2023-01-28 LAB — LACTATE DEHYDROGENASE: LDH: 187 U/L (ref 98–192)

## 2023-01-28 LAB — PROTIME-INR
INR: 1.1 (ref 0.8–1.2)
Prothrombin Time: 14.1 seconds (ref 11.4–15.2)

## 2023-01-28 SURGERY — DEBRIDEMENT, WOUND, STERNUM
Anesthesia: General

## 2023-01-28 MED ORDER — WARFARIN - PHYSICIAN DOSING INPATIENT: Freq: Every day | Status: DC

## 2023-01-28 MED ORDER — OXYCODONE HCL 5 MG PO TABS: 5.0000 mg | ORAL_TABLET | Freq: Once | ORAL | Status: DC | PRN

## 2023-01-28 MED ORDER — EPHEDRINE SULFATE-NACL 50-0.9 MG/10ML-% IV SOSY
PREFILLED_SYRINGE | INTRAVENOUS | Status: DC | PRN
  Administered 2023-01-28 (×2): 2.5 mg via INTRAVENOUS

## 2023-01-28 MED ORDER — PHENYLEPHRINE 80 MCG/ML (10ML) SYRINGE FOR IV PUSH (FOR BLOOD PRESSURE SUPPORT)
PREFILLED_SYRINGE | INTRAVENOUS | Status: DC | PRN
  Administered 2023-01-28 (×2): 80 ug via INTRAVENOUS

## 2023-01-28 MED ORDER — LIDOCAINE 2% (20 MG/ML) 5 ML SYRINGE
INTRAMUSCULAR | Status: AC
  Filled 2023-01-28: qty 5

## 2023-01-28 MED ORDER — ONDANSETRON HCL 4 MG/2ML IJ SOLN
INTRAMUSCULAR | Status: AC
  Filled 2023-01-28: qty 2

## 2023-01-28 MED ORDER — OXYCODONE HCL 5 MG/5ML PO SOLN: 5.0000 mg | Freq: Once | ORAL | Status: DC | PRN

## 2023-01-28 MED ORDER — PHENYLEPHRINE HCL-NACL 20-0.9 MG/250ML-% IV SOLN
INTRAVENOUS | Status: DC | PRN
  Administered 2023-01-28: 25 ug/min via INTRAVENOUS

## 2023-01-28 MED ORDER — SUGAMMADEX SODIUM 200 MG/2ML IV SOLN
INTRAVENOUS | Status: DC | PRN
  Administered 2023-01-28: 200 mg via INTRAVENOUS

## 2023-01-28 MED ORDER — CHLORHEXIDINE GLUCONATE 0.12 % MT SOLN
OROMUCOSAL | Status: AC
  Administered 2023-01-28: 15 mL via OROMUCOSAL
  Filled 2023-01-28: qty 15

## 2023-01-28 MED ORDER — PROPOFOL 10 MG/ML IV BOLUS
INTRAVENOUS | Status: AC
  Filled 2023-01-28: qty 20

## 2023-01-28 MED ORDER — ETOMIDATE 2 MG/ML IV SOLN
INTRAVENOUS | Status: DC | PRN
  Administered 2023-01-28: 20 mg via INTRAVENOUS

## 2023-01-28 MED ORDER — COLCHICINE 0.6 MG PO TABS
0.6000 mg | ORAL_TABLET | Freq: Every day | ORAL | Status: DC | PRN
  Administered 2023-02-12: 0.6 mg via ORAL

## 2023-01-28 MED ORDER — FENTANYL CITRATE (PF) 100 MCG/2ML IJ SOLN
25.0000 ug | INTRAMUSCULAR | Status: DC | PRN
  Administered 2023-01-28 (×2): 25 ug via INTRAVENOUS

## 2023-01-28 MED ORDER — VANCOMYCIN HCL 1000 MG IV SOLR
INTRAVENOUS | Status: AC
  Filled 2023-01-28: qty 20

## 2023-01-28 MED ORDER — LIDOCAINE 2% (20 MG/ML) 5 ML SYRINGE
INTRAMUSCULAR | Status: DC | PRN
  Administered 2023-01-28: 50 mg via INTRAVENOUS

## 2023-01-28 MED ORDER — PROPOFOL 10 MG/ML IV BOLUS
INTRAVENOUS | Status: DC | PRN
  Administered 2023-01-28: 10 mg via INTRAVENOUS

## 2023-01-28 MED ORDER — WARFARIN SODIUM 2.5 MG PO TABS
2.5000 mg | ORAL_TABLET | Freq: Every day | ORAL | Status: DC
  Administered 2023-01-29 – 2023-01-30 (×2): 2.5 mg via ORAL
  Filled 2023-01-28 (×2): qty 1

## 2023-01-28 MED ORDER — LACTATED RINGERS IV SOLN: INTRAVENOUS | Status: DC | PRN

## 2023-01-28 MED ORDER — POTASSIUM CHLORIDE CRYS ER 20 MEQ PO TBCR
40.0000 meq | EXTENDED_RELEASE_TABLET | Freq: Once | ORAL | Status: AC
  Administered 2023-01-28: 40 meq via ORAL
  Filled 2023-01-28: qty 2

## 2023-01-28 MED ORDER — ROCURONIUM BROMIDE 10 MG/ML (PF) SYRINGE
PREFILLED_SYRINGE | INTRAVENOUS | Status: DC | PRN
  Administered 2023-01-28 (×2): 50 mg via INTRAVENOUS

## 2023-01-28 MED ORDER — MORPHINE SULFATE (PF) 4 MG/ML IV SOLN
4.0000 mg | INTRAVENOUS | Status: AC | PRN
  Administered 2023-01-28 – 2023-01-29 (×2): 4 mg via INTRAVENOUS
  Filled 2023-01-28 (×2): qty 1

## 2023-01-28 MED ORDER — FENTANYL CITRATE (PF) 100 MCG/2ML IJ SOLN: 25.0000 ug | INTRAMUSCULAR | Status: DC | PRN

## 2023-01-28 MED ORDER — LACTATED RINGERS IV SOLN: INTRAVENOUS | Status: DC

## 2023-01-28 MED ORDER — CHLORHEXIDINE GLUCONATE 0.12 % MT SOLN: 15.0000 mL | Freq: Once | OROMUCOSAL | Status: AC

## 2023-01-28 MED ORDER — ROCURONIUM BROMIDE 10 MG/ML (PF) SYRINGE
PREFILLED_SYRINGE | INTRAVENOUS | Status: AC
  Filled 2023-01-28: qty 10

## 2023-01-28 MED ORDER — VANCOMYCIN HCL 1000 MG IV SOLR: INTRAVENOUS | Status: DC | PRN

## 2023-01-28 MED ORDER — FENTANYL CITRATE (PF) 250 MCG/5ML IJ SOLN
INTRAMUSCULAR | Status: DC | PRN
  Administered 2023-01-28: 50 ug via INTRAVENOUS

## 2023-01-28 MED ORDER — PROMETHAZINE HCL 25 MG/ML IJ SOLN: 6.2500 mg | INTRAMUSCULAR | Status: DC | PRN

## 2023-01-28 MED ORDER — FENTANYL CITRATE (PF) 100 MCG/2ML IJ SOLN
INTRAMUSCULAR | Status: AC
  Filled 2023-01-28: qty 2

## 2023-01-28 MED ORDER — ETOMIDATE 2 MG/ML IV SOLN
INTRAVENOUS | Status: AC
  Filled 2023-01-28: qty 10

## 2023-01-28 MED ORDER — ORAL CARE MOUTH RINSE: 15.0000 mL | Freq: Once | OROMUCOSAL | Status: AC

## 2023-01-28 MED ORDER — FENTANYL CITRATE (PF) 250 MCG/5ML IJ SOLN
INTRAMUSCULAR | Status: AC
  Filled 2023-01-28: qty 5

## 2023-01-28 MED ORDER — ONDANSETRON HCL 4 MG/2ML IJ SOLN: 4.0000 mg | Freq: Four times a day (QID) | INTRAMUSCULAR | Status: DC | PRN

## 2023-01-28 MED ORDER — OXYCODONE HCL 5 MG PO TABS
5.0000 mg | ORAL_TABLET | ORAL | Status: DC | PRN
  Administered 2023-01-28 – 2023-01-29 (×4): 5 mg via ORAL
  Filled 2023-01-28 (×4): qty 1

## 2023-01-28 MED ORDER — SODIUM CHLORIDE 0.9 % IR SOLN
Status: DC | PRN
  Administered 2023-01-28: 1000 mL

## 2023-01-28 SURGICAL SUPPLY — 74 items
ATTRACTOMAT 16X20 MAGNETIC DRP (DRAPES) ×1 IMPLANT
BAG DECANTER FOR FLEXI CONT (MISCELLANEOUS) ×1 IMPLANT
BENZOIN TINCTURE PRP APPL 2/3 (GAUZE/BANDAGES/DRESSINGS) IMPLANT
BLADE CLIPPER SURG (BLADE) ×1 IMPLANT
BLADE SURG 10 STRL SS (BLADE) ×1 IMPLANT
BLADE SURG 15 STRL LF DISP TIS (BLADE) IMPLANT
BLADE SURG 15 STRL SS (BLADE)
BNDG GAUZE DERMACEA FLUFF 4 (GAUZE/BANDAGES/DRESSINGS) IMPLANT
CANISTER SUCT 3000ML PPV (MISCELLANEOUS) ×1 IMPLANT
CANISTER WOUND CARE 500ML ATS (WOUND CARE) ×1 IMPLANT
CATH FOLEY 2WAY SLVR  5CC 16FR (CATHETERS)
CATH FOLEY 2WAY SLVR 5CC 16FR (CATHETERS) IMPLANT
CATH THORACIC 28FR RT ANG (CATHETERS) IMPLANT
CATH THORACIC 36FR (CATHETERS) IMPLANT
CLIP TI WIDE RED SMALL 24 (CLIP) IMPLANT
CNTNR URN SCR LID CUP LEK RST (MISCELLANEOUS) IMPLANT
CONN Y 3/8X3/8X3/8  BEN (MISCELLANEOUS)
CONN Y 3/8X3/8X3/8 BEN (MISCELLANEOUS) IMPLANT
CONT SPEC 4OZ STRL OR WHT (MISCELLANEOUS)
CONTAINER PROTECT SURGISLUSH (MISCELLANEOUS) ×2 IMPLANT
COVER SURGICAL LIGHT HANDLE (MISCELLANEOUS) ×2 IMPLANT
DRAPE CHEST BREAST 15X10 FENES (DRAPES) IMPLANT
DRAPE DERMATAC (DRAPES) IMPLANT
DRAPE INCISE IOBAN 66X45 STRL (DRAPES) IMPLANT
DRAPE LAPAROSCOPIC ABDOMINAL (DRAPES) ×1 IMPLANT
DRAPE SLUSH/WARMER DISC (DRAPES) IMPLANT
DRAPE WARM FLUID 44X44 (DRAPES) IMPLANT
DRSG AQUACEL AG ADV 3.5X14 (GAUZE/BANDAGES/DRESSINGS) ×1 IMPLANT
DRSG VAC ATS LRG SENSATRAC (GAUZE/BANDAGES/DRESSINGS) ×1 IMPLANT
DRSG VAC ATS MED SENSATRAC (GAUZE/BANDAGES/DRESSINGS) ×1 IMPLANT
DRSG VAC ATS SM SENSATRAC (GAUZE/BANDAGES/DRESSINGS) ×1 IMPLANT
DRSG VAC GRANUFOAM SM (GAUZE/BANDAGES/DRESSINGS) IMPLANT
ELECT BLADE 4.0 EZ CLEAN MEGAD (MISCELLANEOUS) ×1
ELECT REM PT RETURN 9FT ADLT (ELECTROSURGICAL) ×1
ELECTRODE BLDE 4.0 EZ CLN MEGD (MISCELLANEOUS) IMPLANT
ELECTRODE REM PT RTRN 9FT ADLT (ELECTROSURGICAL) ×1 IMPLANT
GAUZE 4X4 16PLY ~~LOC~~+RFID DBL (SPONGE) ×1 IMPLANT
GAUZE PAD ABD 8X10 STRL (GAUZE/BANDAGES/DRESSINGS) IMPLANT
GAUZE SPONGE 4X4 12PLY STRL (GAUZE/BANDAGES/DRESSINGS) ×1 IMPLANT
GAUZE XEROFORM 5X9 LF (GAUZE/BANDAGES/DRESSINGS) IMPLANT
GLOVE BIO SURGEON STRL SZ7.5 (GLOVE) ×2 IMPLANT
GOWN STRL REUS W/ TWL LRG LVL3 (GOWN DISPOSABLE) ×4 IMPLANT
GOWN STRL REUS W/TWL LRG LVL3 (GOWN DISPOSABLE) ×4
HANDPIECE INTERPULSE COAX TIP (DISPOSABLE) ×1
HEMOSTAT POWDER SURGIFOAM 1G (HEMOSTASIS) IMPLANT
HEMOSTAT SURGICEL 2X14 (HEMOSTASIS) IMPLANT
KIT BASIN OR (CUSTOM PROCEDURE TRAY) ×1 IMPLANT
KIT SUCTION CATH 14FR (SUCTIONS) IMPLANT
KIT TURNOVER KIT B (KITS) ×1 IMPLANT
NS IRRIG 1000ML POUR BTL (IV SOLUTION) ×1 IMPLANT
PACK CHEST (CUSTOM PROCEDURE TRAY) ×1 IMPLANT
PACK GENERAL/GYN (CUSTOM PROCEDURE TRAY) ×1 IMPLANT
PAD ARMBOARD 7.5X6 YLW CONV (MISCELLANEOUS) ×2 IMPLANT
SET HNDPC FAN SPRY TIP SCT (DISPOSABLE) ×1 IMPLANT
SOL PREP POV-IOD 4OZ 10% (MISCELLANEOUS) IMPLANT
SPONGE T-LAP 18X18 ~~LOC~~+RFID (SPONGE) ×5 IMPLANT
SPONGE T-LAP 4X18 ~~LOC~~+RFID (SPONGE) ×1 IMPLANT
STAPLER VISISTAT 35W (STAPLE) IMPLANT
SUT ETHILON 3 0 FSL (SUTURE) IMPLANT
SUT PROLENE 3 0 SH DA (SUTURE) IMPLANT
SUT STEEL 6MS V (SUTURE) IMPLANT
SUT STEEL STERNAL CCS#1 18IN (SUTURE) IMPLANT
SUT STEEL SZ 6 DBL 3X14 BALL (SUTURE) IMPLANT
SUT VIC AB 1 CTX 36 (SUTURE)
SUT VIC AB 1 CTX36XBRD ANBCTR (SUTURE) ×2 IMPLANT
SUT VIC AB 2-0 CTX 27 (SUTURE) ×2 IMPLANT
SUT VIC AB 3-0 X1 27 (SUTURE) ×2 IMPLANT
SWAB COLLECTION DEVICE MRSA (MISCELLANEOUS) IMPLANT
SWAB CULTURE ESWAB REG 1ML (MISCELLANEOUS) IMPLANT
SYR 5ML LL (SYRINGE) IMPLANT
TOWEL GREEN STERILE (TOWEL DISPOSABLE) ×1 IMPLANT
TOWEL GREEN STERILE FF (TOWEL DISPOSABLE) ×1 IMPLANT
TRAY FOLEY MTR SLVR 16FR STAT (SET/KITS/TRAYS/PACK) IMPLANT
WATER STERILE IRR 1000ML POUR (IV SOLUTION) ×1 IMPLANT

## 2023-01-28 NOTE — Progress Notes (Signed)
Mobility Specialist Progress Note:   01/28/23 1614  Mobility  Activity Off unit   Pt at procedure. Will follow-up as time allows.  Gareth Eagle Chelsei Mcchesney Mobility Specialist Please contact via [employer] or  Rehab Office at [phone]

## 2023-01-28 NOTE — Op Note (Unsigned)
NAME: Kevin Underwood, Kevin Underwood MEDICAL RECORD NO: [id number] ACCOUNT NO: [id number] DATE OF BIRTH: 1961/09/07 FACILITY: [facility] LOCATION: [facility] PHYSICIAN: Ivin Poot III, MD  Operative Report   DATE OF PROCEDURE: 01/28/2023  OPERATION:  Excisional debridement of VAD tunnel wound infection with wound irrigation, application of wound VAC system.  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  SURGEON:  Ivin Poot III, MD  ANESTHESIA:  General.  CLINICAL NOTE:  The patient was recently seen in the VAD clinic with excessive drainage of purulent material from his VAD power cord tunnel.  Gram stain showed gram-positive cocci.  He was scheduled for excisional debridement of the tunnel with  vacuum-assisted wound suction therapy.  The patient was evaluated in the preoperative holding area where informed consent was documented and final issues addressed and the patient's consent was received.  PROCEDURE DETAILS:  The patient was brought by the anesthesia team to the OR.  The patient was accompanied by the VAD coordinator for the entire procedure to monitor the VAD equipment and to help with management of hemodynamics.  The patient was placed  supine on the operating table.  The patient was induced for general anesthesia and intubated and remained stable.  The patient was positioned and the previous wound dressing was removed.  The lower chest and upper abdomen were prepped and draped as a  sterile field and a proper timeout was performed.  The wound was examined.  There was a space/tunnel going proximally approximately 4-5 cm.  The tissue in the tunnel was very indurated.  The skin was opened above the power cord through the subcutaneous layer.  The indurated  tissue was excised including  the skin, subcutaneous tissue, and some of the posterior rectus fascia.  At the proximal end of the wound some of the material of the outflow graft bend relief was noted.  There was no gross purulent drainage, but significant indolent inflammation and  induration of the tissues in the tunnel.  After the sharp excision debridement, the wound was irrigated with a gram of vancomycin with pulse lavage.  Hemostasis was achieved.  A wound VAC sponge was cut to the appropriate size and orientation and placed in the wound and covered with a wound VAC  sheets and then the suction line was applied.  The wound VAC sponge compressed and there is no air leak.  The patient was reversed from anesthesia and then returned to the recovery room in stable condition, accompanied by the VAD coordinator to help with  management of the equipment and the hemodynamics in the recovery room.   VAI D: 01/28/2023 4:22:47 pm T: 01/28/2023 6:53:00 pm  JOB: [id number]

## 2023-01-28 NOTE — Progress Notes (Signed)
LVAD Coordinator Rounding Note:  Admitted 01/27/23 to Heart Failure service from ED due to driveline infection. Started on IV antibiotics.   Pt lying in bed on my arrival. States he feels bad today. Pt tells me that he has been smoking meth. UDS supports this. Pt states that his joints hurt and he feels "shaky".  Dr Darcey Nora posted driveline debridement for this afternoon. Pt will need wound vac and home IV antibiotics. ID is consulted.  Pt had alarms specifically low voltage hazard alarms overnight will connected to PM. Pt was having this same issue at home and was given a new MPU. Pt told me yesterday that this same issues continued with new MPU. Because of this I opted to change his controller today. There are no visible signs of damage on the leads, driveline or controller. I ask pt to remain on wallpower until we go to the OR in order to see if the new controller will fix this issue.   HeartMate 3 controller exchange performed at the bedside by bedside nurse [place] with VAD coordinator present. We discussed that they should never attempt to do this at home without notifying VAD Pager first to triage need and provide support for the patient's safety as they may incur difficulties with the procedure and impair ability to restart pump. I performed elective controller exchange. Patient tolerated controller exchange well and was asymptomatic. Pump restarted as expected with stable VAD parameters on correct prescribed speed of 5600 / 5300 rpms.  New Controller is [phone].   Vital signs: Temp:  97.3 HR:105 Doppler Pressure: 88 Automatic BP: 82/73 (78)  O2 Sat: 95% RA Wt: 197 lbs   LVAD interrogation reveals:  Speed: 5600 Flow: 4.7 Power: 4 w PI: 2.4 Alarms: LOW VOLTAGE HAZARD alarms overnight Events: 100 + PI events Hct: 30  Fixed speed:  5600 Low speed limit:  5300  Drive Line:  CDI.  Pt will most likely have wound vac placed in OR today. Anchor secure.  Labs:  LDH trend:  203>187  INR trend: 1.1   Anticoagulation Plan: -INR Goal: 2.0 - 2.5 -ASA Dose: none -Heparin 500 u/hr-turn off on call to OR  ICD: N/A   Infection:  01/27/23>> blood cultures>> no growth < 24 hrs 01/27/23>>driveline culture>>MODERATE WBC PRESENT, PREDOMINANTLY PMN  RARE GRAM POSITIVE COCCI IN PAIRS   Plan/Recommendations:  Page VAD coordinator with equipment issues or driveline problems VAD coordinator will accompany pt to OR this afternoon  [name] RN,BSN [place] Coordinator  Office: [phone]  24/7 Pager: [phone]

## 2023-01-28 NOTE — Progress Notes (Signed)
Procedure(s) (LRB): ABDOMINAL DRIVELINE WOUND DEBRIDEMENT (N/A) APPLICATION OF WOUND VAC (N/A) Subjective: Afebrile with normal WBC VAD parameters satisfactory Smear from wound + gram pos cocci in pairs Nasal swab + MRSA  Objective: Vital signs in last 24 hours: Temp:  [97.3 F (36.3 C)-98.6 F (37 C)] 97.3 F (36.3 C) (02/20 0736) Pulse Rate:  [80-105] 105 (02/20 0736) Cardiac Rhythm: Sinus tachycardia (02/20 0736) Resp:  [15-19] 17 (02/20 0736) BP: (82-133)/(50-111) 82/73 (02/20 0736) SpO2:  [95 %-98 %] 95 % (02/20 0736) Weight:  [87.5 kg-89.4 kg] 89.4 kg (02/20 0500)  Hemodynamic parameters for last 24 hours:  nsr  Intake/Output from previous day: 02/19 0701 - 02/20 0700 In: 1386.4 [P.O.:360; I.V.:76.4; IV Piggyback:950] Out: 2475 [Urine:2475] Intake/Output this shift: Total I/O In: 75.5 [I.V.:7.3; IV Piggyback:68.3] Out: -        Exam    General- alert and comfortable VAD dressing intact    Neck- no JVD, no cervical adenopathy palpable, no carotid bruit   Lungs- clear without rales, wheezes   Cor- regular rate and rhythm, normal VAD hum   Abdomen- soft, non-tender   Extremities - warm, non-tender, minimal edema   Neuro- oriented, appropriate, no focal weakness   Lab Results: Recent Labs    01/27/23 0846 01/28/23 0033  WBC 8.0 5.9  HGB 10.9* 10.5*  HCT 32.8* 30.3*  PLT 199 189   BMET:  Recent Labs    01/27/23 0846  NA 135  K 3.7  CL 104  CO2 20*  GLUCOSE 229*  BUN 21  CREATININE 1.05  CALCIUM 9.1    PT/INR:  Recent Labs    01/28/23 0033  LABPROT 14.1  INR 1.1   ABG    Component Value Date/Time   PHART 7.508 (H) 11/20/2020 0438   HCO3 24.8 09/09/2021 0313   TCO2 29 11/20/2020 0438   ACIDBASEDEF 6.0 (H) 11/24/2019 1718   O2SAT 63.4 09/09/2021 0313   CBG (last 3)  Recent Labs    01/27/23 1503 01/27/23 2130 01/28/23 0614  GLUCAP 181* 283* 210*    Assessment/Plan: S/P Procedure(s) (LRB): ABDOMINAL DRIVELINE WOUND  DEBRIDEMENT (N/A) APPLICATION OF WOUND VAC (N/A) Plan VAD tunnel wound debridement later today - general anesthesia. Hold heparin on call to OR   LOS: 1 day    Kevin Underwood 01/28/2023

## 2023-01-28 NOTE — Progress Notes (Signed)
VAD Coordinator Procedure Note:   VAD Coordinator met patient in S37. Pt undergoing driveline debridement with wound vac placement per Dr. Prescott Gum. Hemodynamics and VAD parameters monitored by myself and anesthesia throughout the procedure. Blood pressures were obtained with automatic cuff on left arm.    Time: Doppler Auto  BP Flow PI Power Speed  Pre-procedure:  1304  85/70(77) 4.8 2.2 4.1 5600   1414  108/81(87) 4.2 3.7 4.2 5600           Sedation Induction: 1415  139/118 (127) 3.7 6.9 4.1 5600   1430  87/77(82) 4 3.6 3.8 5600   1445  98/70(79) 4.2 3.4 4.2 5600   1500  115/101 (108) 4.1 3.9 4 5600   1515   4.3 2.6 4.1 5600           Recovery Area: 1538  98/85(91) 4.5 2 4.1 5600   1553  72/54(62) 4.5 2 4.1 5600   1602  83/46(59) 4.4 2.3 4.1 5600    Patient tolerated the procedure well. VAD Coordinator accompanied and remained with patient in recovery area.    Patient Disposition: pt transported to [facility] and handoff given to bedside nurse.   [name] RN, BSN VAD Coordinator 24/7 Pager [phone]

## 2023-01-28 NOTE — Transfer of Care (Signed)
Immediate Anesthesia Transfer of Care Note  Patient: Kevin Underwood.  Procedure(s) Performed: ABDOMINAL DRIVELINE WOUND DEBRIDEMENT APPLICATION OF WOUND VAC  Patient Location: PACU  Anesthesia Type:General  Level of Consciousness: drowsy and pateint uncooperative  Airway & Oxygen Therapy: Patient Spontanous Breathing  Post-op Assessment: Report given to RN and Post -op Vital signs reviewed and stable  Post vital signs: Reviewed and stable  Last Vitals:  Vitals Value Taken Time  BP 98/85 01/28/23 1531  Temp    Pulse    Resp 20 01/28/23 1536  SpO2    Vitals shown include unvalidated device data.  Last Pain:  Vitals:   01/28/23 1305  TempSrc:   PainSc: 0-No pain         Complications: No notable events documented.

## 2023-01-28 NOTE — Brief Op Note (Signed)
01/28/2023  3:26 PM  PATIENT:  Kevin Underwood.  62 y.o. male  PRE-OPERATIVE DIAGNOSIS:  VAD DRIVELINE INFECTION  POST-OPERATIVE DIAGNOSIS:  VAD DRIVELINE INFECTION  PROCEDURE:    Sharp debridement of VAD tunnel indurated tissue- skin, subcutaneous, fascia with vanc irrigation  andwound cultures and small wound VAC sponge  SURGEON:  Surgeon(s) and Role:    [name], MD - Primary  PHYSICIAN ASSISTANT: na  ASSISTANTS: RN  ANESTHESIA:General  EBL: 20 cc  BLOOD ADMINISTERED:none  DRAINS: none   LOCAL MEDICATIONS USED:  NONE  SPECIMEN:  Excision  DISPOSITION OF SPECIMEN:   microbiology  COUNTS:  correct  TOURNIQUET:nonwe  DICTATION: .Dragon Dictation  PLAN OF CARE:  return to 2C-02  PATIENT DISPOSITION:  PACU - hemodynamically stable.   Delay start of Pharmacological VTE agent (>24hrs) due to surgical blood loss or risk of bleeding: yes- resume iv heparin 500 units./hour at 0600 hrs 01-29-23

## 2023-01-28 NOTE — Anesthesia Preprocedure Evaluation (Addendum)
Anesthesia Evaluation  Patient identified by MRN, date of birth, ID band Patient awake    Reviewed: Allergy & Precautions, NPO status , Patient's Chart, lab work & pertinent test results  History of Anesthesia Complications Negative for: history of anesthetic complications  Airway Mallampati: II  TM Distance: >3 FB Neck ROM: Full    Dental  (+) Edentulous Upper, Edentulous Lower   Pulmonary sleep apnea , COPD, former smoker    + wheezing      Cardiovascular hypertension, + angina  + CAD, + CABG and +CHF   Rhythm:Regular Rate:Normal  Echo 06/2022  1. LVAD present at LV apex. Speed increased from 5500 to 5700. Septum  mildly shifted to the right at 5500, shifted more leftward at 5700. Speed  left at 5600 rpm at end of study. Left ventricular ejection fraction, by  estimation, is 20 to 25%. The left  ventricle has severely decreased function. The left ventricle demonstrates  global hypokinesis. There is mild left ventricular hypertrophy. Left  ventricular diastolic parameters are indeterminate.   2. Right ventricular systolic function is mildly reduced. The right  ventricular size is moderately enlarged. Tricuspid regurgitation signal is  inadequate for assessing PA pressure.   3. Left atrial size was mildly dilated.   4. Right atrial size was mildly dilated.   5. The mitral valve is normal in structure. Mild mitral valve  regurgitation. No evidence of mitral stenosis.   6. The aortic valve opened every beat at 5500 rpm, every other beat at 5700 rpm. The aortic valve is tricuspid. Aortic valve regurgitation is not visualized. No aortic stenosis is present.   7. The inferior vena cava is normal in size with greater than 50% respiratory variability, suggesting right atrial pressure of 3 mmHg.   8. LVAD ramp study.      Neuro/Psych negative neurological ROS  negative psych ROS   GI/Hepatic negative GI ROS, Neg liver ROS,,,   Endo/Other  diabetes Hyperthyroidism   Renal/GU Renal disease     Musculoskeletal  (+) Arthritis ,    Abdominal   Peds  Hematology   Anesthesia Other Findings   Reproductive/Obstetrics                             Anesthesia Physical Anesthesia Plan  ASA: 4  Anesthesia Plan: General   Post-op Pain Management: Ofirmev IV (intra-op)*   Induction: Intravenous  PONV Risk Score and Plan: 2 and Ondansetron and Dexamethasone  Airway Management Planned: Oral ETT  Additional Equipment:   Intra-op Plan:   Post-operative Plan: Possible Post-op intubation/ventilation  Informed Consent: I have reviewed the patients History and Physical, chart, labs and discussed the procedure including the risks, benefits and alternatives for the proposed anesthesia with the patient or authorized representative who has indicated his/her understanding and acceptance.     Dental advisory given  Plan Discussed with: CRNA  Anesthesia Plan Comments:         Anesthesia Quick Evaluation

## 2023-01-28 NOTE — Progress Notes (Signed)
ANTICOAGULATION CONSULT NOTE  Pharmacy Consult for Heparin Indication: LVAD  No Known Allergies  Patient Measurements: Height: 5' 10"$  (177.8 cm) Weight: 89.4 kg (197 lb) IBW/kg (Calculated) : 73   Vital Signs: Temp: 97.3 F (36.3 C) (02/20 0736) Temp Source: Oral (02/20 0736) BP: 82/73 (02/20 0736) Pulse Rate: 105 (02/20 0736)  Labs: Recent Labs    01/27/23 0846 01/27/23 1951 01/28/23 0033  HGB 10.9*  --  10.5*  HCT 32.8*  --  30.3*  PLT 199  --  189  APTT 32  --   --   LABPROT 14.2  --  14.1  INR 1.1  --  1.1  HEPARINUNFRC  --  <0.10* <0.10*  CREATININE 1.05  --  1.37*     Estimated Creatinine Clearance: 62.9 mL/min (A) (by C-G formula based on SCr of 1.37 mg/dL (H)).   Medical History: Past Medical History:  Diagnosis Date   "    Arthritis    CAD (coronary artery disease)    a. s/p CABG in 11/2019 with LIMA-LAD, SVG-OM1, SVG-PDA and SVG-D1   CHF (congestive heart failure) (HCC)    a. EF < 20% by echo in 11/2019   Essential hypertension    PAF (paroxysmal atrial fibrillation) (HCC)    Type 2 diabetes mellitus (HCC)     Medications:   heparin Stopped (01/28/23 1230)   meropenem (MERREM) IV Stopped (01/28/23 0548)   vancomycin 1,000 mg (01/28/23 1236)   vancomycin      Assessment: 62 yo male on chronic Coumadin for LVAD, poor compliance with outpatient checks.  INR on admission 1.1.  Previous Coumadin dosen 6 mg daily except 4 mg on Tues, Thurs.  Holding Coumadin for OR today.  On fixed low dose heparin gtt at 500 units/hr.  Heparin level remains undetectable.  Planning to go to the OR for I&D today.  Goal of Therapy:  INR 2-2.5 Monitor platelets by anticoagulation protocol: Yes   Plan:  Continue IV heparin at 500 units/hr for now.  Will not up-titrate before OR.  [place] Crane, Roylene Reason, BCCP Clinical Pharmacist  01/28/2023 12:44 PM   [facility] pharmacy phone numbers are listed on [email or web address]

## 2023-01-28 NOTE — Anesthesia Procedure Notes (Signed)
Procedure Name: Intubation Date/Time: 01/28/2023 2:21 PM  Performed by: Harden Mo, CRNAPre-anesthesia Checklist: Patient identified, Emergency Drugs available, Suction available and Patient being monitored Patient Re-evaluated:Patient Re-evaluated prior to induction Oxygen Delivery Method: Circle System Utilized Preoxygenation: Pre-oxygenation with 100% oxygen Induction Type: IV induction Ventilation: Mask ventilation without difficulty and Oral airway inserted - appropriate to patient size Laryngoscope Size: Sabra Heck and 2 Grade View: Grade I Tube type: Oral Tube size: 7.5 mm Number of attempts: 1 Airway Equipment and Method: Stylet and Oral airway Placement Confirmation: ETT inserted through vocal cords under direct vision, positive ETCO2 and breath sounds checked- equal and bilateral Secured at: 23 cm Tube secured with: Tape Dental Injury: Teeth and Oropharynx as per pre-operative assessment

## 2023-01-28 NOTE — Progress Notes (Signed)
Pre Procedure note for inpatients:   Kevin Underwood. has been scheduled for Procedure(s): ABDOMINAL DRIVELINE WOUND DEBRIDEMENT (N/A) APPLICATION OF WOUND VAC (N/A) today. The various methods of treatment have been discussed with the patient. After consideration of the risks, benefits and treatment options the patient has consented to the planned procedure.   The patient has been seen and labs reviewed. There are no changes in the patient's condition to prevent proceeding with the planned procedure today.  Recent labs:  Lab Results  Component Value Date   WBC 5.9 01/28/2023   HGB 10.5 (L) 01/28/2023   HCT 30.3 (L) 01/28/2023   PLT 189 01/28/2023   GLUCOSE 175 (H) 01/28/2023   CHOL 142 09/11/2021   TRIG 95 09/11/2021   HDL 62 09/11/2021   LDLCALC 61 09/11/2021   ALT 17 01/27/2023   AST 18 01/27/2023   NA 135 01/28/2023   K 3.9 01/28/2023   CL 102 01/28/2023   CREATININE 1.37 (H) 01/28/2023   BUN 27 (H) 01/28/2023   CO2 21 (L) 01/28/2023   TSH 11.044 (H) 01/15/2023   INR 1.1 01/28/2023   HGBA1C 8.2 (H) 05/13/2022    [name], MD 01/28/2023 11:29 AM

## 2023-01-28 NOTE — Progress Notes (Addendum)
Advanced Heart Failure VAD Team Note  PCP-Cardiologist: Rozann Lesches, MD  AHF: Dr. Aundra Dubin   Subjective:    On Vanc + meropenem. BCx NGTD. Denies fever/chills. AF.   Complaining of "body aches all over". RN suspects from meth withdrawal.   Breathing improved post breathing treatment. Less wheezing today. Denies dyspnea.   On heparin gtt. INR 1.1    LVAD INTERROGATION:  HeartMate III LVAD:   Flow 4.8 liters/min, speed 5650, power 4.2, PI 2.6.    Objective:    Vital Signs:   Temp:  [97.4 F (36.3 C)-98.6 F (37 C)] 97.7 F (36.5 C) (02/20 0243) Pulse Rate:  [41-103] 84 (02/20 0243) Resp:  [15-19] 15 (02/20 0243) BP: (84-155)/(50-122) 94/50 (02/20 0243) SpO2:  [86 %-98 %] 98 % (02/20 0243) Weight:  [87.5 kg-93.9 kg] 89.4 kg (02/20 0500) Last BM Date : 01/26/23 Mean arterial Pressure 80s  Intake/Output:   Intake/Output Summary (Last 24 hours) at 01/28/2023 0711 Last data filed at 01/28/2023 0534 Gross per 24 hour  Intake 1386.4 ml  Output 2475 ml  Net -1088.6 ml     Physical Exam    General:  Well appearing. No resp difficulty HEENT: normal Neck: supple. JVP not elevated . Carotids 2+ bilat; no bruits. No lymphadenopathy or thyromegaly appreciated. Cor: Mechanical heart sounds with LVAD hum present. Lungs: decreased BS at the bases b/l  Abdomen: soft, nontender, nondistended. No hepatosplenomegaly. No bruits or masses. Good bowel sounds. Driveline: C/D/I; securement device intact and driveline incorporated + bandaged Extremities: no cyanosis, clubbing, rash, edema Neuro: alert & orientedx3, cranial nerves grossly intact. moves all 4 extremities w/o difficulty. Affect pleasant   Telemetry   Sinus tach low 100s   EKG    ST 102 bpm w/ PACs   Labs   Basic Metabolic Panel: Recent Labs  Lab 01/27/23 0846  NA 135  K 3.7  CL 104  CO2 20*  GLUCOSE 229*  BUN 21  CREATININE 1.05  CALCIUM 9.1    Liver Function Tests: Recent Labs  Lab  01/27/23 0846  AST 18  ALT 17  ALKPHOS 101  BILITOT 0.5  PROT 6.5  ALBUMIN 3.3*   No results for input(s): "LIPASE", "AMYLASE" in the last 168 hours. No results for input(s): "AMMONIA" in the last 168 hours.  CBC: Recent Labs  Lab 01/27/23 0846 01/28/23 0033  WBC 8.0 5.9  HGB 10.9* 10.5*  HCT 32.8* 30.3*  MCV 86.8 85.6  PLT 199 189    INR: Recent Labs  Lab 01/27/23 0846 01/28/23 0033  INR 1.1 1.1    Other results: EKG:    Imaging   CT CHEST ABDOMEN PELVIS WO CONTRAST  Result Date: 01/27/2023 CLINICAL DATA:  Sepsis LVAD driveline infection EXAM: CT CHEST, ABDOMEN AND PELVIS WITHOUT CONTRAST TECHNIQUE: Multidetector CT imaging of the chest, abdomen and pelvis was performed following the standard protocol without IV contrast. RADIATION DOSE REDUCTION: This exam was performed according to the departmental dose-optimization program which includes automated exposure control, adjustment of the mA and/or kV according to patient size and/or use of iterative reconstruction technique. COMPARISON:  CT 06/04/2022, CT 06/14/2022. FINDINGS: CT CHEST FINDINGS Cardiovascular: Stable cardiac size with LVAD in place. No pericardial disease. Normal size main and branch pulmonary arteries. Atrial appendage clip noted. Moderate atherosclerosis of the thoracic aorta. Mediastinum/Nodes: Single prominent pretracheal lymph node, unchanged from prior exam, likely reactive. No new lymphadenopathy.The thyroid is unremarkable. The trachea is unremarkable. Lungs/Pleura: Moderate centrilobular and paraseptal emphysema, apical predominant. No focal  airspace disease. No pleural effusion or pneumothorax. No suspicious pulmonary nodules. Musculoskeletal: And prior median sternotomy. No acute osseous abnormality. No suspicious osseous lesion. CT ABDOMEN PELVIS FINDINGS Hepatobiliary: No focal liver abnormality is seen. The gallbladder is unremarkable. Pancreas: Unremarkable. No pancreatic ductal dilatation or  surrounding inflammatory changes. Spleen: Normal in size without focal abnormality. Adrenals/Urinary Tract: Adrenal glands are unremarkable. No hydronephrosis. There is a punctate nonobstructive stone in the left upper pole. The bladder is moderately distended. Stomach/Bowel: The stomach is within normal limits. There is no evidence of bowel obstruction.The appendix is normal. Vascular/Lymphatic: Extensive aortoiliac atherosclerosis. No AAA. No lymphadenopathy. Reproductive: Unremarkable. Other: There is a small amount of fluid along the LVAD driveline in the anterior upper abdomen, with peripheral soft tissue thickening and mild adjacent stranding. Similar amount of fluid but less adjacent stranding in comparison to prior exams. There are postsurgical changes of prior left upper abdomen wound debridement near the driveline insertion. Tiny fat containing ventral hernias below the drive line. Focal subcutaneous stranding of the anterior abdominal wall midline below the umbilicus. Musculoskeletal: No acute osseous abnormality. No suspicious osseous lesion. Prior L5-S1 lumbosacral fusion. IMPRESSION: Small amount of fluid along the LVAD driveline in the upper abdomen, with adjacent soft tissue stranding, compatible with a drive line infection. Postsurgical changes of prior left upper abdominal wound debridement. Electronically Signed   By: Maurine Simmering M.D.   On: 01/27/2023 10:28   DG Chest Portable 1 View  Result Date: 01/27/2023 CLINICAL DATA:  Provided history: LVAD infection. EXAM: PORTABLE CHEST 1 VIEW COMPARISON:  Prior chest radiographs 09/29/2022 and earlier. FINDINGS: The cardiomediastinal silhouette is unchanged. Prior median sternotomy/CABG. Left atrial appendage clip. Left ventricular assist device. No appreciable airspace consolidation or pulmonary edema. No evidence of pleural effusion or pneumothorax. No acute osseous abnormality identified. Chronic fracture deformity of the left clavicle. IMPRESSION:  No evidence of acute cardiopulmonary abnormality. Aortic Atherosclerosis (ICD10-I70.0). Electronically Signed   By: Kellie Simmering D.O.   On: 01/27/2023 09:14     Medications:     Scheduled Medications:  allopurinol  100 mg Oral Daily   Chlorhexidine Gluconate Cloth  6 each Topical Daily   Chlorhexidine Gluconate Cloth  6 each Topical Q0600   colchicine  0.6 mg Oral Daily   feeding supplement (GLUCERNA SHAKE)  237 mL Oral TID WC   glipiZIDE  5 mg Oral Q breakfast   guaiFENesin  600 mg Oral BID   hydrALAZINE  12.5 mg Oral TID   insulin aspart  0-24 Units Subcutaneous TID WC   insulin aspart  0-5 Units Subcutaneous QHS   ipratropium-albuterol  3 mL Nebulization BID   loratadine  10 mg Oral Daily   metFORMIN  500 mg Oral BID WC   metoprolol succinate  25 mg Oral Daily   minocycline  200 mg Oral BID   multivitamin with minerals  1 tablet Oral Q24H   mupirocin ointment  1 Application Nasal BID   pantoprazole  40 mg Oral Daily   rosuvastatin  20 mg Oral Daily   sacubitril-valsartan  1 tablet Oral BID   sertraline  50 mg Oral Daily   tamsulosin  0.4 mg Oral Daily   thiamine (VITAMIN B1) injection  100 mg Intravenous Daily   traZODone  150 mg Oral QHS   zinc sulfate  220 mg Oral Daily    Infusions:  heparin 500 Units/hr (01/28/23 0534)   meropenem (MERREM) IV 280 mL/hr at 01/28/23 0534   vancomycin Stopped (01/28/23 0228)  vancomycin      PRN Medications: acetaminophen, albuterol, fluticasone, ondansetron (ZOFRAN) IV   Patient Profile   62 y.o. 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 type 2 diabetes mellitus, COPD, hypertension, and HMIII VAD.    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   Assessment/Plan:    1. LVAD Complication---> Recurrent driveline infection   MRSA driveline infection, admitted in 6/23 and again in 7/23-8/23 with multiple trips to the OR for debridement. He has completed vancomycin and was on long-term doxycycline. Most recent culture grew acinetobacter and MRSA. ID recommended minocycline. He had been off minocycline for at least 3 days prior to this admit w/ copious drainage around driveline. CT chest/abd/pelvis showed stranding around the driveline and small amount of fluid. Repeat blood cx obtained. Started on Vanc and Meropenum per ID  - BCx 2/19 NGTD. Cont Abx per ID  - Return to OR for debridement today. INR 1.1.     2. Chronic HFrEF Echo 10/21 with EF 20-25%, mildly decreased RV function. LHC/RHC in 12/21 with patent grafts, low output. Suspect mixed ischemic/nonischemic cardiomyopathy (prior heavy ETOH and drugs as well as CAD).  No ETOH, drugs, smoking since CABG in 12/20. Admitted with cardiogenic shock in 12/21, had placement of Impella 5.5 initially, now s/p Heartmate 3 LVAD on 11/17/20.  Ramp echo 2/23 with speed decreased to 5500 rpm  - Volume status stable.  - No coumadin for now. INR 1.1. Covering w/ heparin gtt  - LDH 203>>187. Follow daily.  - Not a candidate for transplant with ongoing drug use.    3. HTN  - MAPs elevated low 100s on admit, poor compliance - Improved w/ restart of home meds, MAPs 80s - Toprol XL 25 mg daily  - Hydralazine 12.5 mg tid  - Entresto 24-26 mg bid    4. Polysubstance Abuse -Recently started using meth again. -UDS + for amphetamines. Monitor for withdrawal -SW team following. Unfortunately, limited resources for inpatient substance use due LVAD   5. PAF - S/p Maze and LA appendage clip with CABG in 12/20.   - Holding Coumadin for OR. Cont Heparin gtt    6. COPD - wheezing improving  - Duonebs q6hrs    7. DMII - SSI   I reviewed the LVAD parameters from today, and compared the results to the patient's prior recorded data.  No programming changes were made.  The LVAD is  functioning within specified parameters.  The patient performs LVAD self-test daily.  LVAD interrogation was negative for any significant power changes, alarms or PI events/speed drops.  LVAD equipment check completed and is in good working order.  Back-up equipment present.   LVAD education done on emergency procedures and precautions and reviewed exit site care.  Length of Stay: 1  Nelida Gores 01/28/2023, 7:11 AM  VAD Team --- VAD ISSUES ONLY--- Pager [phone] (7am - 7am)  Advanced Heart Failure Team  Pager [phone] (M-F; 7a - 5p)  Please contact [facility] for night-coverage after hours (5p -7a ) and weekends on [email or web address]  Patient seen with PA, agree with the above note.   He is afebrile today, on vancomycin/cefepime for driveline infection, ID following.    Feels jittery, poorly.   LVAD parameters stable. MAP around 80 back on his home meds.  Wheezing much improved with Duonebs.  General: Well appearing this am. NAD.  HEENT: Normal. Neck: Supple, JVP 7-8 cm. Carotids OK.  Cardiac:  Mechanical heart sounds with LVAD hum present.  Lungs:  distant BS Abdomen:  NT, ND, no HSM. No bruits or masses. +BS  LVAD exit site: Well-healed and incorporated. Dressing dry and intact. No erythema or drainage. Stabilization device present and accurately applied. Driveline dressing changed daily per sterile technique. Extremities:  Warm and dry. No cyanosis, clubbing, rash, or edema.  Neuro:  Alert & oriented x 3. Cranial nerves grossly intact. Moves all 4 extremities w/o difficulty. Affect pleasant    Recurrent driveline infection, poor compliance with outpatient antibiotic regimen (minocycline).   - Continue vancomycin/cefepime, ID following.  - Will be going for driveline site debridement today.   INR 1.1, continue heparin gtt until after driveline debridement, than back on warfarin when ok'd by surgery.   Continue Duonebs for COPD, think we can avoid steroids.   MAP  stable back on home meds.   May feel bad in part due to methamphetamine withdrawal.   Loralie Champagne 01/28/2023 8:42 AM  Run of NSVT this morning, not BMET yet. Send BMET and Mg now.  Will not start amiodarone unless recurrent.   Loralie Champagne 01/28/2023

## 2023-01-28 NOTE — Progress Notes (Signed)
CSW attempted to meet with patient this morning although patient was sound asleep. Patient in the OR this afternoon so CSW will see patient tomorrow to discuss further substance use options/rehab. Raquel Sarna, [place], [place]

## 2023-01-28 NOTE — Progress Notes (Signed)
[facility] for Infectious Disease   Reason for visit: Follow up on driveline infection  Interval History: patient seen earlier; blood cultures remain negative.  Swab culture with STaph aureus.  OR today.   Physical Exam: Constitutional:  Vitals:   01/28/23 1305 01/28/23 1307  BP: (!) 85/70 (!) 94/47  Pulse: 83   Resp: 17   Temp:    SpO2:     patient appears in NAD  Review of Systems: Constitutional: negative for fevers and chills  Lab Results  Component Value Date   WBC 5.9 01/28/2023   HGB 10.5 (L) 01/28/2023   HCT 30.3 (L) 01/28/2023   MCV 85.6 01/28/2023   PLT 189 01/28/2023    Lab Results  Component Value Date   CREATININE 1.37 (H) 01/28/2023   BUN 27 (H) 01/28/2023   NA 135 01/28/2023   K 3.9 01/28/2023   CL 102 01/28/2023   CO2 21 (L) 01/28/2023    Lab Results  Component Value Date   ALT 17 01/27/2023   AST 18 01/27/2023   ALKPHOS 101 01/27/2023     Microbiology: Recent Results (from the past 240 hour(s))  Blood culture (routine x 2)     Status: None (Preliminary result)   Collection Time: 01/27/23  8:45 AM   Specimen: BLOOD  Result Value Ref Range Status   Specimen Description BLOOD LEFT ANTECUBITAL  Final   Special Requests   Final    BOTTLES DRAWN AEROBIC AND ANAEROBIC Blood Culture adequate volume   Culture   Final    NO GROWTH < 24 HOURS Performed at [facility], [address] [place], [place] [address]    Report Status PENDING  Incomplete  Aerobic Culture w Gram Stain (superficial specimen)     Status: None (Preliminary result)   Collection Time: 01/27/23  8:45 AM   Specimen: Wound  Result Value Ref Range Status   Specimen Description WOUND  Final   Special Requests NONE  Final   Gram Stain   Final    MODERATE WBC PRESENT, PREDOMINANTLY PMN RARE GRAM POSITIVE COCCI IN PAIRS    Culture   Final    FEW STAPHYLOCOCCUS AUREUS SUSCEPTIBILITIES TO FOLLOW Performed at [facility], [place] [address], [place], [place]  [address]    Report Status PENDING  Incomplete  Blood culture (routine x 2)     Status: None (Preliminary result)   Collection Time: 01/27/23  8:50 AM   Specimen: BLOOD RIGHT FOREARM  Result Value Ref Range Status   Specimen Description BLOOD RIGHT FOREARM  Final   Special Requests   Final    BOTTLES DRAWN AEROBIC AND ANAEROBIC Blood Culture results may not be optimal due to an excessive volume of blood received in culture bottles   Culture   Final    NO GROWTH < 24 HOURS Performed at [facility], [place] [address], [place], [place] [address]    Report Status PENDING  Incomplete  MRSA Next Gen by PCR, Nasal     Status: Abnormal   Collection Time: 01/27/23  3:00 PM   Specimen: Nasal Mucosa; Nasal Swab  Result Value Ref Range Status   MRSA by PCR Next Gen DETECTED (A) NOT DETECTED Final    Comment: RESULT CALLED TO, READ BACK BY AND VERIFIED WITH: [facility], RN 01/27/23 1815 A. LAFRANCE (NOTE) The GeneXpert MRSA Assay (FDA approved for NASAL specimens only), is one component of a comprehensive MRSA colonization surveillance program.  It is not intended to diagnose MRSA infection nor to guide or monitor treatment for MRSA infections. Test performance is not FDA approved in patients less than 65 years old. Performed at [facility], [place] [address], [place], [place] [address]     Impression/Plan:  1. Driveline infection - now getting debridement.  Swab culture noted and will monitor OR cultures and continue with vancomycin and meropenem.   2.  Medication monitoring - will monitor LFTs, creat.  3.  Chronic infection - has had Acinetobacter and MRSA previously and growing the same as above.  On appropriate antibiotics and will monitor new cultures.

## 2023-01-29 ENCOUNTER — Encounter (HOSPITAL_COMMUNITY): Payer: Self-pay | Admitting: Cardiothoracic Surgery

## 2023-01-29 DIAGNOSIS — Z95811 Presence of heart assist device: Secondary | ICD-10-CM | POA: Diagnosis not present

## 2023-01-29 DIAGNOSIS — T827XXA Infection and inflammatory reaction due to other cardiac and vascular devices, implants and grafts, initial encounter: Secondary | ICD-10-CM | POA: Diagnosis not present

## 2023-01-29 DIAGNOSIS — T829XXD Unspecified complication of cardiac and vascular prosthetic device, implant and graft, subsequent encounter: Secondary | ICD-10-CM | POA: Diagnosis not present

## 2023-01-29 LAB — PROTIME-INR
INR: 1.1 (ref 0.8–1.2)
Prothrombin Time: 13.7 seconds (ref 11.4–15.2)

## 2023-01-29 LAB — VANCOMYCIN, TROUGH: Vancomycin Tr: 14 ug/mL — ABNORMAL LOW (ref 15–20)

## 2023-01-29 LAB — BASIC METABOLIC PANEL
Anion gap: 6 (ref 5–15)
BUN: 20 mg/dL (ref 8–23)
CO2: 25 mmol/L (ref 22–32)
Calcium: 8.5 mg/dL — ABNORMAL LOW (ref 8.9–10.3)
Chloride: 104 mmol/L (ref 98–111)
Creatinine, Ser: 1.18 mg/dL (ref 0.61–1.24)
GFR, Estimated: >60 mL/min (ref >60)
Glucose, Bld: 164 mg/dL — ABNORMAL HIGH (ref 70–99)
Potassium: 4 mmol/L (ref 3.5–5.1)
Sodium: 135 mmol/L (ref 135–145)

## 2023-01-29 LAB — CBC
HCT: 33.1 % — ABNORMAL LOW (ref 39.0–52.0)
Hemoglobin: 11 g/dL — ABNORMAL LOW (ref 13.0–17.0)
MCH: 28.7 pg (ref 26.0–34.0)
MCHC: 33.2 g/dL (ref 30.0–36.0)
MCV: 86.4 fL (ref 80.0–100.0)
Platelets: 186 10*3/uL (ref 150–400)
RBC: 3.83 MIL/uL — ABNORMAL LOW (ref 4.22–5.81)
RDW: 14.3 % (ref 11.5–15.5)
WBC: 6.4 10*3/uL (ref 4.0–10.5)
nRBC: 0 % (ref 0.0–0.2)

## 2023-01-29 LAB — AEROBIC CULTURE W GRAM STAIN (SUPERFICIAL SPECIMEN)

## 2023-01-29 LAB — GLUCOSE, CAPILLARY
Glucose-Capillary: 134 mg/dL — ABNORMAL HIGH (ref 70–99)
Glucose-Capillary: 80 mg/dL (ref 70–99)
Glucose-Capillary: 155 mg/dL — ABNORMAL HIGH (ref 70–99)
Glucose-Capillary: 133 mg/dL — ABNORMAL HIGH (ref 70–99)

## 2023-01-29 LAB — VANCOMYCIN, PEAK: Vancomycin Pk: 29 ug/mL — ABNORMAL LOW (ref 30–40)

## 2023-01-29 LAB — LACTATE DEHYDROGENASE: LDH: 142 U/L (ref 98–192)

## 2023-01-29 LAB — HEPARIN LEVEL (UNFRACTIONATED): Heparin Unfractionated: <0.1 IU/mL — ABNORMAL LOW (ref 0.30–0.70)

## 2023-01-29 MED ORDER — HEPARIN (PORCINE) 25000 UT/250ML-% IV SOLN
500.0000 [IU]/h | INTRAVENOUS | Status: DC
  Administered 2023-01-29 – 2023-02-02 (×3): 500 [IU]/h via INTRAVENOUS
  Filled 2023-01-29 (×3): qty 250

## 2023-01-29 MED ORDER — MINOCYCLINE HCL 50 MG PO CAPS
200.0000 mg | ORAL_CAPSULE | Freq: Two times a day (BID) | ORAL | Status: DC
  Administered 2023-01-29 – 2023-02-20 (×39): 200 mg via ORAL
  Filled 2023-01-29 (×36): qty 4
  Filled 2023-01-29: qty 2
  Filled 2023-01-29 (×11): qty 4

## 2023-01-29 MED ORDER — ASPIRIN 325 MG PO TBEC
325.0000 mg | DELAYED_RELEASE_TABLET | Freq: Every day | ORAL | Status: DC
  Administered 2023-01-29 – 2023-02-03 (×6): 325 mg via ORAL
  Filled 2023-01-29 (×6): qty 1

## 2023-01-29 MED ORDER — WARFARIN - PHARMACIST DOSING INPATIENT: Freq: Every day | Status: DC

## 2023-01-29 MED ORDER — MORPHINE SULFATE (PF) 4 MG/ML IV SOLN: 4.0000 mg | INTRAVENOUS | Status: AC | PRN

## 2023-01-29 MED ORDER — SIMETHICONE 80 MG PO CHEW
80.0000 mg | CHEWABLE_TABLET | Freq: Once | ORAL | Status: AC
  Administered 2023-01-29: 80 mg via ORAL
  Filled 2023-01-29: qty 1

## 2023-01-29 NOTE — Progress Notes (Addendum)
Advanced Heart Failure VAD Team Note  PCP-Cardiologist: Rozann Lesches, MD  AHF: Dr. Aundra Dubin   Subjective:    On Vanc + meropenem. BCx NGTD. Denies fever/chills. AF.   OR for debridement w/ wound vac placement 2/20. Plan to return to OR for washout 2/28.  Continued body aches, suspected from meth withdrawal  Breathing improved with breathing treatments   Now back on warfarin, INR 1.1  LVAD INTERROGATION:  HeartMate III LVAD:   Flow 4.5 liters/min, speed 5600, power 4.1, PI 2.4.  x17 PI events this morning  Objective:    Vital Signs:   Temp:  [97.3 F (36.3 C)-98.1 F (36.7 C)] 98.1 F (36.7 C) (02/21 0433) Pulse Rate:  [62-105] 98 (02/21 0433) Resp:  [13-20] 19 (02/21 0433) BP: (75-113)/(46-89) 113/88 (02/21 0433) SpO2:  [90 %-100 %] 95 % (02/21 0433) Last BM Date : 01/26/23 Mean arterial Pressure 80-100s?  Intake/Output:   Intake/Output Summary (Last 24 hours) at 01/29/2023 0707 Last data filed at 01/29/2023 [id number] Gross per 24 hour  Intake 755.72 ml  Output 795 ml  Net -39.28 ml    Physical Exam    General:  restless appearing. No resp difficulty HEENT: Normal Neck: supple. No JVP . Carotids 2+ bilat; no bruits. No lymphadenopathy or thyromegaly appreciated. Cor: Mechanical heart sounds with LVAD hum present. Lungs: diminished. Exp wheezes Abdomen: soft, nontender, nondistended. No hepatosplenomegaly. No bruits or masses. Good bowel sounds. Driveline: C/D/I; securement device intact. Wound vac in place, good suction.  Extremities: no cyanosis, clubbing, rash, edema Neuro: alert & orientedx3, cranial nerves grossly intact. moves all 4 extremities w/o difficulty. Affect pleasant   Telemetry   NSR 90s. Brief 10 beat NSVT this am. 0 PVCs (Personally reviewed)    EKG   No new EKG  Labs   Basic Metabolic Panel: Recent Labs  Lab 01/27/23 0846 01/28/23 0033 01/29/23 0017  NA 135 135 135  K 3.7 3.9 4.0  CL 104 102 104  CO2 20* 21* 25  GLUCOSE 229*  175* 164*  BUN 21 27* 20  CREATININE 1.05 1.37* 1.18  CALCIUM 9.1 8.5* 8.5*  MG  --  1.9  --     Liver Function Tests: Recent Labs  Lab 01/27/23 0846  AST 18  ALT 17  ALKPHOS 101  BILITOT 0.5  PROT 6.5  ALBUMIN 3.3*   No results for input(s): "LIPASE", "AMYLASE" in the last 168 hours. No results for input(s): "AMMONIA" in the last 168 hours.  CBC: Recent Labs  Lab 01/27/23 0846 01/28/23 0033 01/29/23 0017  WBC 8.0 5.9 6.4  HGB 10.9* 10.5* 11.0*  HCT 32.8* 30.3* 33.1*  MCV 86.8 85.6 86.4  PLT 199 189 186    INR: Recent Labs  Lab 01/27/23 0846 01/28/23 0033 01/29/23 0017  INR 1.1 1.1 1.1    Other results: EKG:    Imaging   CT CHEST ABDOMEN PELVIS WO CONTRAST  Result Date: 01/27/2023 CLINICAL DATA:  Sepsis LVAD driveline infection EXAM: CT CHEST, ABDOMEN AND PELVIS WITHOUT CONTRAST TECHNIQUE: Multidetector CT imaging of the chest, abdomen and pelvis was performed following the standard protocol without IV contrast. RADIATION DOSE REDUCTION: This exam was performed according to the departmental dose-optimization program which includes automated exposure control, adjustment of the mA and/or kV according to patient size and/or use of iterative reconstruction technique. COMPARISON:  CT 06/04/2022, CT 06/14/2022. FINDINGS: CT CHEST FINDINGS Cardiovascular: Stable cardiac size with LVAD in place. No pericardial disease. Normal size main and branch pulmonary arteries. Atrial  appendage clip noted. Moderate atherosclerosis of the thoracic aorta. Mediastinum/Nodes: Single prominent pretracheal lymph node, unchanged from prior exam, likely reactive. No new lymphadenopathy.The thyroid is unremarkable. The trachea is unremarkable. Lungs/Pleura: Moderate centrilobular and paraseptal emphysema, apical predominant. No focal airspace disease. No pleural effusion or pneumothorax. No suspicious pulmonary nodules. Musculoskeletal: And prior median sternotomy. No acute osseous abnormality.  No suspicious osseous lesion. CT ABDOMEN PELVIS FINDINGS Hepatobiliary: No focal liver abnormality is seen. The gallbladder is unremarkable. Pancreas: Unremarkable. No pancreatic ductal dilatation or surrounding inflammatory changes. Spleen: Normal in size without focal abnormality. Adrenals/Urinary Tract: Adrenal glands are unremarkable. No hydronephrosis. There is a punctate nonobstructive stone in the left upper pole. The bladder is moderately distended. Stomach/Bowel: The stomach is within normal limits. There is no evidence of bowel obstruction.The appendix is normal. Vascular/Lymphatic: Extensive aortoiliac atherosclerosis. No AAA. No lymphadenopathy. Reproductive: Unremarkable. Other: There is a small amount of fluid along the LVAD driveline in the anterior upper abdomen, with peripheral soft tissue thickening and mild adjacent stranding. Similar amount of fluid but less adjacent stranding in comparison to prior exams. There are postsurgical changes of prior left upper abdomen wound debridement near the driveline insertion. Tiny fat containing ventral hernias below the drive line. Focal subcutaneous stranding of the anterior abdominal wall midline below the umbilicus. Musculoskeletal: No acute osseous abnormality. No suspicious osseous lesion. Prior L5-S1 lumbosacral fusion. IMPRESSION: Small amount of fluid along the LVAD driveline in the upper abdomen, with adjacent soft tissue stranding, compatible with a drive line infection. Postsurgical changes of prior left upper abdominal wound debridement. Electronically Signed   By: Maurine Simmering M.D.   On: 01/27/2023 10:28   DG Chest Portable 1 View  Result Date: 01/27/2023 CLINICAL DATA:  Provided history: LVAD infection. EXAM: PORTABLE CHEST 1 VIEW COMPARISON:  Prior chest radiographs 09/29/2022 and earlier. FINDINGS: The cardiomediastinal silhouette is unchanged. Prior median sternotomy/CABG. Left atrial appendage clip. Left ventricular assist device. No  appreciable airspace consolidation or pulmonary edema. No evidence of pleural effusion or pneumothorax. No acute osseous abnormality identified. Chronic fracture deformity of the left clavicle. IMPRESSION: No evidence of acute cardiopulmonary abnormality. Aortic Atherosclerosis (ICD10-I70.0). Electronically Signed   By: Kellie Simmering D.O.   On: 01/27/2023 09:14    Medications:     Scheduled Medications:  allopurinol  100 mg Oral Daily   Chlorhexidine Gluconate Cloth  6 each Topical Daily   Chlorhexidine Gluconate Cloth  6 each Topical Q0600   feeding supplement (GLUCERNA SHAKE)  237 mL Oral TID WC   glipiZIDE  5 mg Oral Q breakfast   guaiFENesin  600 mg Oral BID   hydrALAZINE  12.5 mg Oral TID   insulin aspart  0-24 Units Subcutaneous TID WC   insulin aspart  0-5 Units Subcutaneous QHS   ipratropium-albuterol  3 mL Nebulization BID   loratadine  10 mg Oral Daily   metFORMIN  500 mg Oral BID WC   metoprolol succinate  25 mg Oral Daily   multivitamin with minerals  1 tablet Oral Q24H   mupirocin ointment  1 Application Nasal BID   pantoprazole  40 mg Oral Daily   rosuvastatin  20 mg Oral Daily   sacubitril-valsartan  1 tablet Oral BID   sertraline  50 mg Oral Daily   tamsulosin  0.4 mg Oral Daily   thiamine (VITAMIN B1) injection  100 mg Intravenous Daily   traZODone  150 mg Oral QHS   vancomycin (VANCOCIN) 1,000 mg in sodium chloride 0.9 % 1,000  mL irrigation   Irrigation To OR   warfarin  2.5 mg Oral Daily   Warfarin - Physician Dosing Inpatient   Does not apply q1600   zinc sulfate  220 mg Oral Daily    Infusions:  lactated ringers Stopped (01/28/23 2307)   meropenem (MERREM) IV 2 g (01/29/23 [id number])   vancomycin 1,000 mg (01/29/23 0044)    PRN Medications: acetaminophen, albuterol, colchicine, fluticasone, ondansetron (ZOFRAN) IV, oxyCODONE   Patient Profile  62 y.o. 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 type 2 diabetes mellitus, COPD, hypertension, and HMIII VAD.    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  Assessment/Plan:   1. LVAD Complication---> Recurrent driveline infection  MRSA driveline infection, admitted in 6/23 and again in 7/23-8/23 with multiple trips to the OR for debridement. He has completed vancomycin and was on long-term doxycycline. Most recent culture grew acinetobacter and MRSA. ID recommended minocycline. He had been off minocycline for at least 3 days prior to this admit w/ copious drainage around driveline. CT chest/abd/pelvis showed stranding around the driveline and small amount of fluid. Repeat blood cx obtained. Started on Vanc and Meropenum per ID  - BCx 2/19 NGTD. Cont Abx per ID  - wound cultures 2/20: few gram + cocci - OR for debridement w/ wound vac placement 2/20 - Plan to return to OR for washout 2/28.   2. Chronic HFrEF Echo 10/21 with EF 20-25%, mildly decreased RV function. LHC/RHC in 12/21 with patent grafts, low output. Suspect mixed ischemic/nonischemic cardiomyopathy (prior heavy ETOH and drugs as well as CAD).  No ETOH, drugs, smoking since CABG in 12/20. Admitted with cardiogenic shock in 12/21, had placement of Impella 5.5 initially, now s/p Heartmate 3 LVAD on 11/17/20.  Ramp echo 2/23 with speed decreased to 5500 rpm  - Volume status stable.  - No coumadin for now. INR 1.1. Covering w/ heparin gtt? - LDH 203>>187>142. Follow daily.  - Not a candidate for transplant with ongoing drug use.    3. HTN  - MAPs elevated low 100s on admit, poor compliance - Improved w/ restart of home meds, MAPs 80s. Will have RN recheck this am. - Toprol XL 25 mg daily  - Hydralazine 12.5 mg tid  - Entresto 24-26 mg bid    4. Polysubstance Abuse -Recently started using meth again. -UDS + for amphetamines. Monitor for  withdrawal -SW team following. Unfortunately, limited resources for inpatient substance use due LVAD   5. PAF - S/p Maze and LA appendage clip with CABG in 12/20.   - back on coumadin   6. COPD - wheezing improving  - Duonebs q6hrs    7. DMII - SSI   8.  NSVT - yesterday 2/20 - Will hold off on starting amiodarone unless recurrent - Keep K >4, Mg >2  Addendum 01/29/23 1:32 PM Hypotensive with VAD MAPs in 60s, correlating with cuff pressure. Will hold entresto and hydralazine. Patient asymptomatic.   I reviewed the LVAD parameters from today, and compared the results to the patient's prior recorded data.  No programming changes were made.  The LVAD is functioning within specified parameters.  The patient performs LVAD self-test daily.  LVAD interrogation was negative for any significant power changes, alarms or PI events/speed drops.  LVAD equipment check completed and is in good working order.  Back-up equipment present.   LVAD education done on emergency procedures and precautions and reviewed exit site care.  Length of Stay: [place], NP 01/29/2023, 7:07 AM  VAD Team --- VAD ISSUES ONLY--- Pager [phone] (7am - 7am)  Advanced Heart Failure Team  Pager [phone] (M-F; 7a - 5p)  Please contact [facility] for night-coverage after hours (5p -7a ) and weekends on [email or web address]  Patient seen with NP, agree with the above note.    To OR yesterday for debridement, wound culture growing MRSA.  MAP mildly low at 67 today after getting oxycodone and hydralazine.   General: Well appearing this am. NAD.  HEENT: Normal. Neck: Supple, JVP 7-8 cm. Carotids OK.  Cardiac:  Mechanical heart sounds with LVAD hum present.  Lungs:  CTAB, normal effort.  Abdomen:  NT, ND, no HSM. No bruits or masses. +BS  LVAD exit site: Wound vac present Extremities:  Warm and dry. No cyanosis, clubbing, rash, or edema.  Neuro:  Alert & oriented x 3. Cranial nerves grossly intact. Moves all 4  extremities w/o difficulty. Affect pleasant    Continue vancomycin for MRSA driveline infection.  Will need 6 wks then back to minocycline.  Will stay here until 2/28 when he will have wound vac change in OR.   Can restart low dose heparin and run INR 1.8-2.2 on warfarin until he returns to OR.   Soft BP, stop hydralazine.   Loralie Champagne 01/29/2023 1:36 PM

## 2023-01-29 NOTE — Progress Notes (Signed)
1 Day Post-Op Procedure(s) (LRB): ABDOMINAL DRIVELINE WOUND DEBRIDEMENT (N/A) APPLICATION OF WOUND VAC (N/A) Subjective: The patient states the surgical pain is much better today. Wound VAC system has had intermittent stop alarms since last night.  There is no evidence of blockage or air leak.  The sponge appears to be intact without thrombus formation.  The pump for the system will be exchanged out, if this does not remedy the stop alarms then  the sponge will need to be changed later today.  Objective: Vital signs in last 24 hours: Temp:  [97.7 F (36.5 C)-98.8 F (37.1 C)] 98.8 F (37.1 C) (02/21 0740) Pulse Rate:  [62-98] 89 (02/21 0740) Cardiac Rhythm: Normal sinus rhythm (02/21 0740) Resp:  [13-20] 16 (02/21 0740) BP: (75-113)/(46-89) 99/68 (02/21 0740) SpO2:  [90 %-100 %] 96 % (02/21 0830) Weight:  [91.3 kg] 91.3 kg (02/21 0923)  Hemodynamic parameters for last 24 hours:  Stable sinus rhythm  Intake/Output from previous day: 02/20 0701 - 02/21 0700 In: 831.2 [P.O.:240; I.V.:356.2; IV Piggyback:235.1] Out: 795 [Urine:770; Drains:25] Intake/Output this shift: No intake/output data recorded.        Exam    General- alert and comfortable.  Wound VAC sponges compressed without fluid accumulation.    Neck- no JVD, no cervical adenopathy palpable, no carotid bruit   Lungs-bilateral scattered wheezes   Cor- regular rate and rhythm, normal VAD hum   Abdomen- soft, non-tender   Extremities - warm, non-tender, minimal edema   Neuro- oriented, appropriate, no focal weakness  Lab Results: Recent Labs    01/28/23 0033 01/29/23 0017  WBC 5.9 6.4  HGB 10.5* 11.0*  HCT 30.3* 33.1*  PLT 189 186   BMET:  Recent Labs    01/28/23 0033 01/29/23 0017  NA 135 135  K 3.9 4.0  CL 102 104  CO2 21* 25  GLUCOSE 175* 164*  BUN 27* 20  CREATININE 1.37* 1.18  CALCIUM 8.5* 8.5*    PT/INR:  Recent Labs    01/29/23 0017  LABPROT 13.7  INR 1.1   ABG    Component Value  Date/Time   PHART 7.508 (H) 11/20/2020 0438   HCO3 24.8 09/09/2021 0313   TCO2 29 11/20/2020 0438   ACIDBASEDEF 6.0 (H) 11/24/2019 1718   O2SAT 63.4 09/09/2021 0313   CBG (last 3)  Recent Labs    01/28/23 1651 01/28/23 2104 01/29/23 0606  GLUCAP 141* 121* 134*   OR wound cultures growing gram-positive cocci in chains, further identification pending.   Assessment/Plan: S/P Procedure(s) (LRB): ABDOMINAL DRIVELINE WOUND DEBRIDEMENT (N/A) APPLICATION OF WOUND VAC (N/A) Continue IV vancomycin and meropenem for now pending culture sensitivities.  Provide appropriate intervention to maintain function of the wound VAC system.  Resume IV heparin low-dose per pharmacy, resume oral Coumadin dosing per pharmacy, start aspirin 325 mg until INR approaches 2.0.  Repeat wound VAC change next Wednesday, February 28 with INR goal 1.8-2.2   LOS: 2 days    [name] 01/29/2023

## 2023-01-29 NOTE — Progress Notes (Signed)
ANTICOAGULATION CONSULT NOTE  Pharmacy Consult for Heparin > warfarin  Indication: LVAD  No Known Allergies  Patient Measurements: Height: 5' 10"$  (177.8 cm) Weight: 91.3 kg (201 lb 4.5 oz) IBW/kg (Calculated) : 73   Vital Signs: Temp: 98.5 F (36.9 C) (02/21 1101) Temp Source: Oral (02/21 1101) BP: 108/93 (02/21 1101) Pulse Rate: 108 (02/21 1101)  Labs: Recent Labs    01/27/23 0846 01/27/23 1951 01/28/23 0033 01/29/23 0017  HGB 10.9*  --  10.5* 11.0*  HCT 32.8*  --  30.3* 33.1*  PLT 199  --  189 186  APTT 32  --   --   --   LABPROT 14.2  --  14.1 13.7  INR 1.1  --  1.1 1.1  HEPARINUNFRC  --  <0.10* <0.10* <0.10*  CREATININE 1.05  --  1.37* 1.18     Estimated Creatinine Clearance: 73.7 mL/min (by C-G formula based on SCr of 1.18 mg/dL).   Medical History: Past Medical History:  Diagnosis Date   "    Arthritis    CAD (coronary artery disease)    a. s/p CABG in 11/2019 with LIMA-LAD, SVG-OM1, SVG-PDA and SVG-D1   CHF (congestive heart failure) (HCC)    a. EF < 20% by echo in 11/2019   Essential hypertension    PAF (paroxysmal atrial fibrillation) (HCC)    Type 2 diabetes mellitus (HCC)     Medications:   heparin 500 Units/hr (01/29/23 1123)   lactated ringers Stopped (01/28/23 2307)   vancomycin 1,000 mg (01/29/23 1116)    Assessment: 62 yo male on chronic Coumadin for LVAD, poor compliance with outpatient checks.  INR on admission 1.1.  Previous Coumadin dosen 6 mg daily except 4 mg on Tues, Thurs.  S/p OR 2/20 for drive line infection debridement.  Restart fixed low dose heparin gtt at 500 units/hr no up titration.  Restart warfarin with goal about 2 for planned debridement next week  Start low dose warfarin today per MD and increase per pharmacy tomorrow based on INR results Cbc stable no bleeding LDH stable 100s, INR 1.1 Continue asa while INR low can stop when INR closer to 2  Goal of Therapy:  INR 2-2.5 Monitor platelets by anticoagulation  protocol: Yes   Plan:  IV heparin at 500 units/hr  Warfarin 2.26m x1  Daily cbc, heparin level and INR   LBonnita NasutiPharm.D. CPP, BCPS Clinical Pharmacist [phone]/21/2024 1:34 PM     [facility] phone [facility] are listed on [email or web address]

## 2023-01-29 NOTE — Progress Notes (Signed)
CSW attempted to visit patient at bedside although he was sleeping comfortably. CSW will attempt again tomorrow. Raquel Sarna, [place], [place]

## 2023-01-29 NOTE — Progress Notes (Incomplete)
PHARMACY CONSULT NOTE FOR:  OUTPATIENT  PARENTERAL ANTIBIOTIC THERAPY (OPAT)  Indication: MRSA LVAD DLI  Regimen: Daptomycin 700 mg IV every 24 hours End date: 03/11/23 *** (6 weeks from OR 01/28/23 ***)  IV antibiotic discharge orders are pended. To discharging provider:  please sign these orders via discharge navigator,  Select New Orders & click on the button choice - Manage This Unsigned Work.     Thank you for allowing pharmacy to be a part of this patient's care.  Alycia Rossetti, PharmD, BCPS Infectious Diseases Clinical Pharmacist 01/30/2023 8:47 AM   **Pharmacist phone directory can now be found on [email or web address] ([email or web address]).  Listed under [place].

## 2023-01-29 NOTE — Progress Notes (Signed)
Wound vac alarming with error code. RN attempted to troubleshoot. Dr. Prescott Gum at bedside attempted to troubleshoot. RN changed out old wound vac with new wound vac and change wound vac setting from 125 to 100 mmg per Dr. Prescott Gum. No alarms currently.

## 2023-01-29 NOTE — Progress Notes (Signed)
[facility] for Infectious Disease   Reason for visit: Follow up on driveline infection  Interval History: growth in swab and OR culture with MRSA; s/p I and D by Dr. Prescott Gum yesterday.   Day 3 antibiotics  Physical Exam: Constitutional:  Vitals:   01/29/23 0740 01/29/23 0830  BP: 99/68   Pulse: 89   Resp: 16   Temp: 98.8 F (37.1 C)   SpO2:  96%   patient appears in NAD Eyes: anicteric Pulmonary: normal respiratory effort  Review of Systems: Constitutional: negative for fevers and chills  Lab Results  Component Value Date   WBC 6.4 01/29/2023   HGB 11.0 (L) 01/29/2023   HCT 33.1 (L) 01/29/2023   MCV 86.4 01/29/2023   PLT 186 01/29/2023    Lab Results  Component Value Date   CREATININE 1.18 01/29/2023   BUN 20 01/29/2023   NA 135 01/29/2023   K 4.0 01/29/2023   CL 104 01/29/2023   CO2 25 01/29/2023    Lab Results  Component Value Date   ALT 17 01/27/2023   AST 18 01/27/2023   ALKPHOS 101 01/27/2023     Microbiology: Recent Results (from the past 240 hour(s))  Blood culture (routine x 2)     Status: None (Preliminary result)   Collection Time: 01/27/23  8:45 AM   Specimen: BLOOD  Result Value Ref Range Status   Specimen Description BLOOD LEFT ANTECUBITAL  Final   Special Requests   Final    BOTTLES DRAWN AEROBIC AND ANAEROBIC Blood Culture adequate volume   Culture   Final    NO GROWTH 2 DAYS Performed at [facility], [address] [place], [place] [address]    Report Status PENDING  Incomplete  Aerobic Culture w Gram Stain (superficial specimen)     Status: None   Collection Time: 01/27/23  8:45 AM   Specimen: Wound  Result Value Ref Range Status   Specimen Description WOUND  Final   Special Requests NONE  Final   Gram Stain   Final    MODERATE WBC PRESENT, PREDOMINANTLY PMN RARE GRAM POSITIVE COCCI IN PAIRS Performed at [facility], [place] [address], [place], [place] [address]    Culture FEW METHICILLIN RESISTANT  STAPHYLOCOCCUS AUREUS  Final   Report Status 01/29/2023 FINAL  Final   Organism ID, Bacteria METHICILLIN RESISTANT STAPHYLOCOCCUS AUREUS  Final      Susceptibility   Methicillin resistant staphylococcus aureus - MIC*    CIPROFLOXACIN >=8 RESISTANT Resistant     ERYTHROMYCIN >=8 RESISTANT Resistant     GENTAMICIN <=0.5 SENSITIVE Sensitive     OXACILLIN >=4 RESISTANT Resistant     TETRACYCLINE <=1 SENSITIVE Sensitive     VANCOMYCIN 1 SENSITIVE Sensitive     TRIMETH/SULFA >=320 RESISTANT Resistant     CLINDAMYCIN <=0.25 SENSITIVE Sensitive     RIFAMPIN <=0.5 SENSITIVE Sensitive     Inducible Clindamycin NEGATIVE Sensitive     * FEW METHICILLIN RESISTANT STAPHYLOCOCCUS AUREUS  Blood culture (routine x 2)     Status: None (Preliminary result)   Collection Time: 01/27/23  8:50 AM   Specimen: BLOOD RIGHT FOREARM  Result Value Ref Range Status   Specimen Description BLOOD RIGHT FOREARM  Final   Special Requests   Final    BOTTLES DRAWN AEROBIC AND ANAEROBIC Blood Culture results may not be optimal due to an excessive volume of blood received in culture bottles   Culture   Final  NO GROWTH 2 DAYS Performed at [facility], [place] [address] [place], [place] [address]    Report Status PENDING  Incomplete  MRSA Next Gen by PCR, Nasal     Status: Abnormal   Collection Time: 01/27/23  3:00 PM   Specimen: Nasal Mucosa; Nasal Swab  Result Value Ref Range Status   MRSA by PCR Next Gen DETECTED (A) NOT DETECTED Final    Comment: RESULT CALLED TO, READ BACK BY AND VERIFIED WITH: C/ A. [facility], RN 01/27/23 1815 A. LAFRANCE (NOTE) The GeneXpert MRSA Assay (FDA approved for NASAL specimens only), is one component of a comprehensive MRSA colonization surveillance program. It is not intended to diagnose MRSA infection nor to guide or monitor treatment for MRSA infections. Test performance is not FDA approved in patients less than 23 years old. Performed at [facility], [place] [address], [place], [place] [address]   Aerobic/Anaerobic Culture w Gram Stain (surgical/deep wound)     Status: None (Preliminary result)   Collection Time: 01/28/23  2:14 PM   Specimen: Other Source; Body Fluid  Result Value Ref Range Status   Specimen Description WOUND ABDOMEN  Final   Special Requests PT ON VANC  Final   Gram Stain   Final    FEW WBC PRESENT,BOTH PMN AND MONONUCLEAR FEW GRAM POSITIVE COCCI IN PAIRS IN SINGLES    Culture   Final    FEW STAPHYLOCOCCUS AUREUS CULTURE REINCUBATED FOR BETTER GROWTH Performed at [facility], [place] [address] [place], [place] [address]    Report Status PENDING  Incomplete    Impression/Plan:  1. 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  Growth as expected with known MRSA.  He will remain on vancomycin and tolerating fine.  Plan for 6 weeks IV then back to minocycline.  No other growth so will stop the meropenem.   2.  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  I will put him back on minocycline for the Acinetobacter and he is on vancomycin as above.    3.  Medication monitoring - creat wnl now and will continue to monitor.    4.  Substance abuse - amphetamines in his drug screen again.

## 2023-01-29 NOTE — Plan of Care (Signed)
  Problem: Cardiac: Goal: LVAD will function as expected and patient will experience no clinical alarms Outcome: Progressing   Problem: Education: Goal: Knowledge of General Education information will improve Description: Including pain rating scale, medication(s)/side effects and non-pharmacologic comfort measures Outcome: Progressing   Problem: Clinical Measurements: Goal: Respiratory complications will improve Outcome: Progressing   Problem: Nutrition: Goal: Adequate nutrition will be maintained Outcome: Progressing   Problem: Elimination: Goal: Will not experience complications related to bowel motility Outcome: Progressing Goal: Will not experience complications related to urinary retention Outcome: Progressing   Problem: Pain Managment: Goal: General experience of comfort will improve Outcome: Progressing   Problem: Skin Integrity: Goal: Risk for impaired skin integrity will decrease Outcome: Progressing

## 2023-01-29 NOTE — Progress Notes (Signed)
Mobility Specialist Progress Note:   01/29/23 1533  Mobility  Activity Ambulated with assistance to bathroom  Level of Assistance Standby assist, set-up cues, supervision of patient - no hands on  Assistive Device None  Distance Ambulated (ft) 20 ft  Activity Response Tolerated well  $Mobility charge 1 Mobility   Pt in bed asking to use bathroom. Pt had BM. No complaints of pain. Left in bed with call bell in reach and all needs met.   Gareth Eagle Kerolos Nehme Mobility Specialist Please contact via [facility] [employer] or  Rehab Office at [phone]

## 2023-01-29 NOTE — Anesthesia Postprocedure Evaluation (Signed)
Anesthesia Post Note  Patient: Kevin Underwood.  Procedure(s) Performed: ABDOMINAL DRIVELINE WOUND DEBRIDEMENT APPLICATION OF WOUND VAC     Patient location during evaluation: PACU Anesthesia Type: General Level of consciousness: awake and alert Pain management: pain level controlled Vital Signs Assessment: post-procedure vital signs reviewed and stable Respiratory status: spontaneous breathing, nonlabored ventilation, respiratory function stable and patient connected to nasal cannula oxygen Cardiovascular status: blood pressure returned to baseline and stable Postop Assessment: no apparent nausea or vomiting Anesthetic complications: no   No notable events documented.  Last Vitals:  Vitals:   01/29/23 0740 01/29/23 0830  BP: 99/68   Pulse: 89   Resp: 16   Temp: 37.1 C   SpO2:  96%    Last Pain:  Vitals:   01/29/23 0740  TempSrc: Oral  PainSc: 0-No pain                 Nyzir Dubois S

## 2023-01-29 NOTE — Progress Notes (Signed)
Mobility Specialist Progress Note:   01/29/23 1021  Mobility  Activity Ambulated with assistance in room  Level of Assistance Standby assist, set-up cues, supervision of patient - no hands on  Assistive Device None  Distance Ambulated (ft) 10 ft  Activity Response Tolerated well  $Mobility charge 1 Mobility   Pt EOB needing to use bathroom. No complaints of pain. Pt declined hallway ambulation. Left in bed with call bell in reach and all needs met.   Gareth Eagle Starlette Thurow Mobility Specialist Please contact via [employer] or  Rehab Office at [phone]

## 2023-01-29 NOTE — Progress Notes (Addendum)
LVAD Coordinator Rounding Note:  Admitted 01/27/23 to Heart Failure service from ED due to driveline infection. Started on IV antibiotics.   Pt sitting up in bed eating breakfast. States he is feeling a little better this morning.   S/p driveline debridement yesterday afternoon in OR. Wound vac dressing clean, dry, intact. No alarms or air leak noted. Scant serosanguinous drainage in canister. Plan to return to OR for washout 02/05/23 per Dr Prescott Gum.   Pt will need wound vac and home IV antibiotics at discharge. ID is consulted. Plan to continue Vancomycin and Meropenem per Dr Linus Salmons note 2/20.   Controller electively exchanged yesterday. No further low voltage hazard alarms seen on interrogation this morning.  Vital signs: Temp:  98.8 HR: 95 Doppler Pressure: 80 Automatic BP: 99/68 (79) O2 Sat: 96% RA Wt: 192.9>197 lbs   LVAD interrogation reveals:  Speed: 5600 Flow: 4.7 Power: 4.2 w PI: 2.0 Alarms: none Events: 70 + PI events overnight; 17 so far this morning Hct: 33  Fixed speed:  5600 Low speed limit:  5300  Drive Line:  Wound vac dressing clean, dry, intact. No alarms or air leak noted. Scant serosanguinous drainage in canister. Suction -100. Plan for washout and wound vac change in OR 02/05/23 per Dr Prescott Gum.   Labs:  LDH trend: 203>187>142  INR trend: 1.1>1.1  Anticoagulation Plan: -INR Goal: 2.0 - 2.5 -ASA Dose: none -Heparin 500 u/hr - restarted this morning per Dr Prescott Gum  ICD: N/A  Infection:  01/27/23>> blood cultures>> no growth < 24 hrs 01/27/23>>driveline culture>>MODERATE WBC PRESENT, PREDOMINANTLY PMN  RARE GRAM POSITIVE COCCI IN PAIRS   Plan/Recommendations:  Page VAD coordinator with equipment issues or driveline problems Plan for to return to OR next Wednesday for washout with Dr Lucianne Lei Simon Rhein RN [place] Coordinator  Office: [phone]  24/7 Pager: [phone]

## 2023-01-30 ENCOUNTER — Inpatient Hospital Stay (HOSPITAL_COMMUNITY): Payer: 59

## 2023-01-30 DIAGNOSIS — Z95811 Presence of heart assist device: Secondary | ICD-10-CM | POA: Diagnosis not present

## 2023-01-30 DIAGNOSIS — T827XXA Infection and inflammatory reaction due to other cardiac and vascular devices, implants and grafts, initial encounter: Secondary | ICD-10-CM | POA: Diagnosis not present

## 2023-01-30 DIAGNOSIS — T829XXD Unspecified complication of cardiac and vascular prosthetic device, implant and graft, subsequent encounter: Secondary | ICD-10-CM | POA: Diagnosis not present

## 2023-01-30 LAB — BASIC METABOLIC PANEL
Anion gap: 11 (ref 5–15)
Anion gap: 14 (ref 5–15)
BUN: 12 mg/dL (ref 8–23)
BUN: 15 mg/dL (ref 8–23)
CO2: 24 mmol/L (ref 22–32)
CO2: 25 mmol/L (ref 22–32)
Calcium: 8.8 mg/dL — ABNORMAL LOW (ref 8.9–10.3)
Calcium: 9 mg/dL (ref 8.9–10.3)
Chloride: 98 mmol/L (ref 98–111)
Chloride: 93 mmol/L — ABNORMAL LOW (ref 98–111)
Creatinine, Ser: 1.04 mg/dL (ref 0.61–1.24)
Creatinine, Ser: 1.13 mg/dL (ref 0.61–1.24)
GFR, Estimated: >60 mL/min (ref >60)
GFR, Estimated: >60 mL/min (ref >60)
Glucose, Bld: 157 mg/dL — ABNORMAL HIGH (ref 70–99)
Glucose, Bld: 145 mg/dL — ABNORMAL HIGH (ref 70–99)
Potassium: 3.9 mmol/L (ref 3.5–5.1)
Potassium: 3.9 mmol/L (ref 3.5–5.1)
Sodium: 133 mmol/L — ABNORMAL LOW (ref 135–145)
Sodium: 132 mmol/L — ABNORMAL LOW (ref 135–145)

## 2023-01-30 LAB — CBC
HCT: 33.4 % — ABNORMAL LOW (ref 39.0–52.0)
Hemoglobin: 11.3 g/dL — ABNORMAL LOW (ref 13.0–17.0)
MCH: 28.7 pg (ref 26.0–34.0)
MCHC: 33.8 g/dL (ref 30.0–36.0)
MCV: 84.8 fL (ref 80.0–100.0)
Platelets: 190 10*3/uL (ref 150–400)
RBC: 3.94 MIL/uL — ABNORMAL LOW (ref 4.22–5.81)
RDW: 14.1 % (ref 11.5–15.5)
WBC: 5.9 10*3/uL (ref 4.0–10.5)
nRBC: 0 % (ref 0.0–0.2)

## 2023-01-30 LAB — TYPE AND SCREEN
ABO/RH(D): B POS
Antibody Screen: NEGATIVE
Unit division: 0

## 2023-01-30 LAB — BPAM RBC
Blood Product Expiration Date: 202403092359
Unit Type and Rh: 7300

## 2023-01-30 LAB — HEPARIN LEVEL (UNFRACTIONATED): Heparin Unfractionated: <0.1 IU/mL — ABNORMAL LOW (ref 0.30–0.70)

## 2023-01-30 LAB — GLUCOSE, CAPILLARY
Glucose-Capillary: 176 mg/dL — ABNORMAL HIGH (ref 70–99)
Glucose-Capillary: 186 mg/dL — ABNORMAL HIGH (ref 70–99)
Glucose-Capillary: 212 mg/dL — ABNORMAL HIGH (ref 70–99)
Glucose-Capillary: 174 mg/dL — ABNORMAL HIGH (ref 70–99)

## 2023-01-30 LAB — LACTATE DEHYDROGENASE: LDH: 145 U/L (ref 98–192)

## 2023-01-30 LAB — MAGNESIUM: Magnesium: 1.9 mg/dL (ref 1.7–2.4)

## 2023-01-30 LAB — PROTIME-INR
INR: 1.1 (ref 0.8–1.2)
Prothrombin Time: 13.7 seconds (ref 11.4–15.2)

## 2023-01-30 MED ORDER — LEVALBUTEROL HCL 0.63 MG/3ML IN NEBU
0.6300 mg | INHALATION_SOLUTION | Freq: Four times a day (QID) | RESPIRATORY_TRACT | Status: DC | PRN
  Administered 2023-01-30 – 2023-02-19 (×13): 0.63 mg via RESPIRATORY_TRACT
  Filled 2023-01-30 (×14): qty 3

## 2023-01-30 MED ORDER — IPRATROPIUM-ALBUTEROL 0.5-2.5 (3) MG/3ML IN SOLN
3.0000 mL | Freq: Four times a day (QID) | RESPIRATORY_TRACT | Status: DC
  Administered 2023-01-30: 3 mL via RESPIRATORY_TRACT
  Filled 2023-01-30: qty 3

## 2023-01-30 MED ORDER — WARFARIN SODIUM 1 MG PO TABS
1.0000 mg | ORAL_TABLET | Freq: Once | ORAL | Status: AC
  Administered 2023-01-30: 1 mg via ORAL
  Filled 2023-01-30: qty 1

## 2023-01-30 MED ORDER — AMIODARONE IV BOLUS ONLY 150 MG/100ML
150.0000 mg | Freq: Once | INTRAVENOUS | Status: AC
  Administered 2023-01-30: 150 mg via INTRAVENOUS
  Filled 2023-01-30: qty 100

## 2023-01-30 MED ORDER — SACUBITRIL-VALSARTAN 24-26 MG PO TABS
1.0000 | ORAL_TABLET | Freq: Two times a day (BID) | ORAL | Status: DC
  Administered 2023-01-30 – 2023-02-01 (×5): 1 via ORAL
  Filled 2023-01-30 (×5): qty 1

## 2023-01-30 MED ORDER — VANCOMYCIN HCL 1750 MG/350ML IV SOLN
1750.0000 mg | INTRAVENOUS | Status: DC
  Filled 2023-01-30: qty 350

## 2023-01-30 MED ORDER — VANCOMYCIN HCL IN DEXTROSE 1-5 GM/200ML-% IV SOLN
1000.0000 mg | Freq: Two times a day (BID) | INTRAVENOUS | Status: DC
  Administered 2023-01-30 – 2023-02-03 (×10): 1000 mg via INTRAVENOUS
  Filled 2023-01-30 (×12): qty 200

## 2023-01-30 MED ORDER — FUROSEMIDE 10 MG/ML IJ SOLN
40.0000 mg | Freq: Once | INTRAMUSCULAR | Status: AC
  Administered 2023-01-30: 40 mg via INTRAVENOUS
  Filled 2023-01-30: qty 4

## 2023-01-30 MED ORDER — POTASSIUM CHLORIDE CRYS ER 20 MEQ PO TBCR
40.0000 meq | EXTENDED_RELEASE_TABLET | Freq: Once | ORAL | Status: AC
  Administered 2023-01-30: 40 meq via ORAL
  Filled 2023-01-30: qty 2

## 2023-01-30 MED ORDER — HYDRALAZINE HCL 25 MG PO TABS
12.5000 mg | ORAL_TABLET | Freq: Three times a day (TID) | ORAL | Status: DC
  Administered 2023-01-30: 12.5 mg via ORAL
  Filled 2023-01-30: qty 1

## 2023-01-30 MED ORDER — OXYCODONE HCL 5 MG PO TABS
5.0000 mg | ORAL_TABLET | Freq: Four times a day (QID) | ORAL | Status: DC | PRN
  Administered 2023-01-30 – 2023-02-02 (×4): 5 mg via ORAL
  Filled 2023-01-30 (×4): qty 1

## 2023-01-30 NOTE — Progress Notes (Addendum)
Advanced Heart Failure VAD Team Note  PCP-Cardiologist: Rozann Lesches, MD  AHF: Dr. Aundra Dubin   Subjective:   On Vanc . Meropenem completed 2/21. BCx NGTD. Wound Cx with MRSA. Denies fever/chills. AF.   OR for debridement w/ wound vac placement 2/20. Plan to return to OR for washout 2/28.  Now back on warfarin + heparin, INR remains low at 1.1  Hypotensive yesterday afternoon, asymptomatic with VAD MAP in 60s. BP meds held. Suspect 2/2 oxy + BP meds.   SOB this morning with audible wheezing. Had LR @ 50 mL/hr since yesterday afternoon. Denies CP.  Didn't ambulate yesterday.   LVAD INTERROGATION:  HeartMate III LVAD:   Flow 4.3 liters/min, speed 5600, power 4.2, PI 3.1.  x7 PI events this morning  Objective:    Vital Signs:   Temp:  [97.7 F (36.5 C)-98.8 F (37.1 C)] 97.9 F (36.6 C) (02/22 0504) Pulse Rate:  [89-108] 96 (02/22 0636) Resp:  [16-19] 19 (02/22 0636) BP: (98-144)/(68-99) 144/99 (02/22 0504) SpO2:  [94 %-99 %] 94 % (02/22 0636) Weight:  [88.9 kg-91.3 kg] 88.9 kg (02/22 0500) Last BM Date : 01/26/23 Mean arterial Pressure 100-110s  Intake/Output:   Intake/Output Summary (Last 24 hours) at 01/30/2023 0709 Last data filed at 01/30/2023 0514 Gross per 24 hour  Intake 859.78 ml  Output 2300 ml  Net -1440.22 ml    Physical Exam    General:  chronically ill appearing.  HEENT: 2L [place] Neck: supple. JVP ~8. Carotids 2+ bilat; no bruits. No lymphadenopathy or thyromegaly appreciated. Cor: Mechanical heart sounds with LVAD hum present. Lungs: diminished. Exp/Insp wheezing Abdomen: soft, nontender, nondistended. No hepatosplenomegaly. No bruits or masses. Good bowel sounds. Driveline: C/D/I; securement device intact. Wound vac around driveline site, good suction seal Extremities: no cyanosis, clubbing, rash, edema Neuro: alert & orientedx3, cranial nerves grossly intact. moves all 4 extremities w/o difficulty. Affect pleasant   Telemetry   NSR 90s (Personally  reviewed)    EKG   No new EKG  Labs   Basic Metabolic Panel: Recent Labs  Lab 01/27/23 0846 01/28/23 0033 01/29/23 0017 01/30/23 0537  NA 135 135 135 133*  K 3.7 3.9 4.0 3.9  CL 104 102 104 98  CO2 20* 21* 25 24  GLUCOSE 229* 175* 164* 157*  BUN 21 27* 20 12  CREATININE 1.05 1.37* 1.18 1.04  CALCIUM 9.1 8.5* 8.5* 8.8*  MG  --  1.9  --   --    Liver Function Tests: Recent Labs  Lab 01/27/23 0846  AST 18  ALT 17  ALKPHOS 101  BILITOT 0.5  PROT 6.5  ALBUMIN 3.3*   CBC: Recent Labs  Lab 01/27/23 0846 01/28/23 0033 01/29/23 0017 01/30/23 0537  WBC 8.0 5.9 6.4 5.9  HGB 10.9* 10.5* 11.0* 11.3*  HCT 32.8* 30.3* 33.1* 33.4*  MCV 86.8 85.6 86.4 84.8  PLT 199 189 186 190   INR: Recent Labs  Lab 01/27/23 0846 01/28/23 0033 01/29/23 0017 01/30/23 0537  INR 1.1 1.1 1.1 1.1    Other results: EKG:    Imaging   No results found.  Medications:     Scheduled Medications:  allopurinol  100 mg Oral Daily   aspirin EC  325 mg Oral Daily   Chlorhexidine Gluconate Cloth  6 each Topical Daily   Chlorhexidine Gluconate Cloth  6 each Topical Q0600   feeding supplement (GLUCERNA SHAKE)  237 mL Oral TID WC   glipiZIDE  5 mg Oral Q breakfast  guaiFENesin  600 mg Oral BID   insulin aspart  0-24 Units Subcutaneous TID WC   insulin aspart  0-5 Units Subcutaneous QHS   ipratropium-albuterol  3 mL Nebulization BID   loratadine  10 mg Oral Daily   metFORMIN  500 mg Oral BID WC   metoprolol succinate  25 mg Oral Daily   minocycline  200 mg Oral BID   multivitamin with minerals  1 tablet Oral Q24H   mupirocin ointment  1 Application Nasal BID   pantoprazole  40 mg Oral Daily   rosuvastatin  20 mg Oral Daily   sertraline  50 mg Oral Daily   tamsulosin  0.4 mg Oral Daily   thiamine (VITAMIN B1) injection  100 mg Intravenous Daily   traZODone  150 mg Oral QHS   warfarin  2.5 mg Oral Daily   Warfarin - Pharmacist Dosing Inpatient   Does not apply q1600   zinc  sulfate  220 mg Oral Daily    Infusions:  heparin 500 Units/hr (01/29/23 1815)   lactated ringers 50 mL/hr at 01/29/23 2301   vancomycin      PRN Medications: acetaminophen, albuterol, colchicine, fluticasone, ondansetron (ZOFRAN) IV, oxyCODONE   Patient Profile  62 y.o. 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 type 2 diabetes mellitus, COPD, hypertension, and HMIII VAD.    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  Assessment/Plan:   1. LVAD Complication---> Recurrent driveline infection  MRSA driveline infection, admitted in 6/23 and again in 7/23-8/23 with multiple trips to the OR for debridement. He has completed vancomycin and was on long-term doxycycline. Most recent culture grew acinetobacter and MRSA. ID recommended minocycline. He had been off minocycline for at least 3 days prior to this admit w/ copious drainage around driveline. CT chest/abd/pelvis showed stranding around the driveline and small amount of fluid. Repeat blood cx obtained. Started on Vanc and Meropenum per ID. Now off meropenum (2/21) - BCx 2/19 NGTD. Cont Abx per ID  - wound cultures 2/20: few gram + cocci - OR for debridement w/ wound vac placement 2/20 - Plan to return to OR for washout 2/28.   2. Chronic HFrEF Echo 10/21 with EF 20-25%, mildly decreased RV function. LHC/RHC in 12/21 with patent grafts, low output. Suspect mixed ischemic/nonischemic cardiomyopathy (prior heavy ETOH and drugs as well as CAD).  No ETOH, drugs, smoking since CABG in 12/20. Admitted with cardiogenic shock in 12/21, had placement of Impella 5.5 initially, now s/p Heartmate 3 LVAD on 11/17/20.  Ramp echo 2/23 with speed decreased to 5500 rpm  - Volume status mildly elevated 2/2 ongoing IVF. IVF now d/c'd. Will do 40 IV lasix  today - Warfarin restarted, goal INR 1.8-2.2 pending repeat surgery. Covering w/ heparin gtt - LDH 203>>187>142>145. Follow daily.  - Not a candidate for transplant with ongoing drug use.    3. HTN  - MAPs elevated low 100s on admit, poor compliance - Improved w/ restart of home meds, MAPs 80s. Will have RN recheck this am. - Toprol XL 25 mg daily  - 2/21 with asymptomatic hypotension (VAD MAP in 60s), suspected 2/2 oxy + BP meds. Hydralazine and Entresto held. MAPS elevated again this morning, will restart hydralazine (will monitor with diuresis) . Pain regimen spaced out a Q4>Q6   4. Polysubstance Abuse -Recently started using meth  again. -UDS + for amphetamines. Monitor for withdrawal -SW team following. Unfortunately, limited resources for inpatient substance use due LVAD   5. PAF - S/p Maze and LA appendage clip with CABG in 12/20.   - back on coumadin   6. COPD - wheezing improving  - Duonebs q6hrs    7. DMII - SSI   8.  NSVT - yesterday 2/20 - Will hold off on starting amiodarone unless recurrent - Keep K >4, Mg >2   I reviewed the LVAD parameters from today, and compared the results to the patient's prior recorded data.  No programming changes were made.  The LVAD is functioning within specified parameters.  The patient performs LVAD self-test daily.  LVAD interrogation was negative for any significant power changes, alarms or PI events/speed drops.  LVAD equipment check completed and is in good working order.  Back-up equipment present.   LVAD education done on emergency procedures and precautions and reviewed exit site care.  Length of Stay: [place], NP 01/30/2023, 7:09 AM  VAD Team --- VAD ISSUES ONLY--- Pager [phone] (7am - 7am)  Advanced Heart Failure Team  Pager [phone] (M-F; 7a - 5p)  Please contact [facility] for night-coverage after hours (5p -7a ) and weekends on [email or web address]  Patient seen with NP, agree with the above note.   Short of  breath and wheezing this morning, NRBM in place.  Of note, he had IV fluid yesterday afternoon/evening with low BP.  MAP now 100s.    CXR without acute changes.   General: Well appearing this am. NAD.  HEENT: Normal. Neck: Supple, JVP 8-9 cm. Carotids OK.  Cardiac:  Mechanical heart sounds with LVAD hum present.  Lungs:  End expiratory wheezes.   Abdomen:  NT, ND, no HSM. No bruits or masses. +BS  LVAD exit site: Well-healed and incorporated. Dressing dry and intact. No erythema or drainage. Stabilization device present and accurately applied. Driveline dressing changed daily per sterile technique. Extremities:  Warm and dry. No cyanosis, clubbing, rash, or edema.  Neuro:  Alert & oriented x 3. Cranial nerves grossly intact. Moves all 4 extremities w/o difficulty. Affect pleasant    I suspect he has some volume overload with IVF yesterday.  Will give Lasix 40 mg IV bid and replace K today.    With wheezing and COPD, will make Duonebs q6 hrs standing.   BP elevated, restart Entresto and leave off hydralazine for now.   Continue vancomycin for MRSA driveline infection.  Will need 6 wks then back to minocycline.  Will stay here until 2/28 when he will have wound vac change in OR.    Continue low dose heparin and run INR 1.8-2.2 on warfarin until he returns to OR.   Loralie Champagne 01/30/2023 9:39 AM

## 2023-01-30 NOTE — Progress Notes (Addendum)
Pharmacy Antibiotic Note  Kevin Underwood. is a 62 y.o. male with hx HF s/p LVAD admitted on 01/27/2023 with cellulitis.  Pharmacy has been consulted for vancomycin/cefepime > now changing to vancomycin and meropenem per ID rec's.  SCr 1.05 on admit.  2/22 AM update: Vancomycin AUC above goal, Scr 1.05>>1.37>>1.18  Plan: Change vancomycin to 1750 mg IV q24h >>>New estimated AUC: 485 Also on PO minocycline per ID Re-check levels as needed  Addendum: VP/VT 29/14 respectively with calculated AUC of ~550 which is therapeutic and with downtrending SCr likely will trend lower. Will continue previous dosing of 1g/12h for now.   Alycia Rossetti, PharmD, BCPS Infectious Diseases Clinical Pharmacist 01/30/2023 8:46 AM   Height: 5' 10"$  (177.8 cm) Weight: 91.3 kg (201 lb 4.5 oz) IBW/kg (Calculated) : 73  Temp (24hrs), Avg:98.3 F (36.8 C), Min:97.7 F (36.5 C), Max:98.8 F (37.1 C)  Recent Labs  Lab 01/27/23 0846 01/28/23 0033 01/29/23 0017 01/29/23 1440 01/29/23 2258  WBC 8.0 5.9 6.4  --   --   CREATININE 1.05 1.37* 1.18  --   --   VANCOTROUGH  --   --   --   --  14*  VANCOPEAK  --   --   --  29*  --      Estimated Creatinine Clearance: 73.7 mL/min (by C-G formula based on SCr of 1.18 mg/dL).    No Known Allergies   Narda Bonds, PharmD, BCPS Clinical Pharmacist Phone: [phone]

## 2023-01-30 NOTE — Progress Notes (Signed)
Kevin Underwood for Infectious Disease   Reason for visit: Follow up on driveline infection  Interval History: no new positive cultures; WBC 5.9  Day 4 antibiotics  Physical Exam: Constitutional:  Vitals:   01/30/23 0726 01/30/23 1000  BP: (!) 112/93   Pulse: (!) 105 93  Resp: 19 20  Temp: 98.5 F (36.9 C) 98.3 F (36.8 C)  SpO2: 97%   He is in nad Eyes: anicteric Pulmonary: normal respiratory effort  Review of Systems: Constitutional: negative for fevers and chills  Lab Results  Component Value Date   WBC 5.9 01/30/2023   HGB 11.3 (L) 01/30/2023   HCT 33.4 (L) 01/30/2023   MCV 84.8 01/30/2023   PLT 190 01/30/2023    Lab Results  Component Value Date   CREATININE 1.04 01/30/2023   BUN 12 01/30/2023   NA 133 (L) 01/30/2023   K 3.9 01/30/2023   CL 98 01/30/2023   CO2 24 01/30/2023    Lab Results  Component Value Date   ALT 17 01/27/2023   AST 18 01/27/2023   ALKPHOS 101 01/27/2023     Microbiology: Recent Results (from the past 240 hour(s))  Blood culture (routine x 2)     Status: None (Preliminary result)   Collection Time: 01/27/23  8:45 AM   Specimen: BLOOD  Result Value Ref Range Status   Specimen Description BLOOD LEFT ANTECUBITAL  Final   Special Requests   Final    BOTTLES DRAWN AEROBIC AND ANAEROBIC Blood Culture adequate volume   Culture   Final    NO GROWTH 3 DAYS Performed at [facility], [address] [place], [place] [address]    Report Status PENDING  Incomplete  Aerobic Culture w Gram Stain (superficial specimen)     Status: None   Collection Time: 01/27/23  8:45 AM   Specimen: Wound  Result Value Ref Range Status   Specimen Description WOUND  Final   Special Requests NONE  Final   Gram Stain   Final    MODERATE WBC PRESENT, PREDOMINANTLY PMN RARE GRAM POSITIVE COCCI IN PAIRS Performed at [facility], [place] [address], [place], [place] [address]    Culture FEW METHICILLIN RESISTANT STAPHYLOCOCCUS AUREUS  Final    Report Status 01/29/2023 FINAL  Final   Organism ID, Bacteria METHICILLIN RESISTANT STAPHYLOCOCCUS AUREUS  Final      Susceptibility   Methicillin resistant staphylococcus aureus - MIC*    CIPROFLOXACIN >=8 RESISTANT Resistant     ERYTHROMYCIN >=8 RESISTANT Resistant     GENTAMICIN <=0.5 SENSITIVE Sensitive     OXACILLIN >=4 RESISTANT Resistant     TETRACYCLINE <=1 SENSITIVE Sensitive     VANCOMYCIN 1 SENSITIVE Sensitive     TRIMETH/SULFA >=320 RESISTANT Resistant     CLINDAMYCIN <=0.25 SENSITIVE Sensitive     RIFAMPIN <=0.5 SENSITIVE Sensitive     Inducible Clindamycin NEGATIVE Sensitive     * FEW METHICILLIN RESISTANT STAPHYLOCOCCUS AUREUS  Blood culture (routine x 2)     Status: None (Preliminary result)   Collection Time: 01/27/23  8:50 AM   Specimen: BLOOD RIGHT FOREARM  Result Value Ref Range Status   Specimen Description BLOOD RIGHT FOREARM  Final   Special Requests   Final    BOTTLES DRAWN AEROBIC AND ANAEROBIC Blood Culture results may not be optimal due to an excessive volume of blood received in culture bottles   Culture   Final    NO GROWTH 3 DAYS Performed at  [facility], [place] [address], [place], [place] [address]    Report Status PENDING  Incomplete  MRSA Next Gen by PCR, Nasal     Status: Abnormal   Collection Time: 01/27/23  3:00 PM   Specimen: Nasal Mucosa; Nasal Swab  Result Value Ref Range Status   MRSA by PCR Next Gen DETECTED (A) NOT DETECTED Final    Comment: RESULT CALLED TO, READ BACK BY AND VERIFIED WITH: C/ A. [facility], RN 01/27/23 1815 A. LAFRANCE (NOTE) The GeneXpert MRSA Assay (FDA approved for NASAL specimens only), is one component of a comprehensive MRSA colonization surveillance program. It is not intended to diagnose MRSA infection nor to guide or monitor treatment for MRSA infections. Test performance is not FDA approved in patients less than 3 years old. Performed at [facility], [place] [address] [place], [place] [address]    Aerobic/Anaerobic Culture w Gram Stain (surgical/deep wound)     Status: None (Preliminary result)   Collection Time: 01/28/23  2:14 PM   Specimen: Other Source; Body Fluid  Result Value Ref Range Status   Specimen Description WOUND ABDOMEN  Final   Special Requests PT ON VANC  Final   Gram Stain   Final    FEW WBC PRESENT,BOTH PMN AND MONONUCLEAR FEW GRAM POSITIVE COCCI IN PAIRS IN SINGLES Performed at [facility], [place] [address] [place], [place] [address]    Culture   Final    FEW STAPHYLOCOCCUS AUREUS SUSCEPTIBILITIES TO FOLLOW NO ANAEROBES ISOLATED; CULTURE IN PROGRESS FOR 5 DAYS    Report Status PENDING  Incomplete    Impression/Plan:  1. Driveline infection - same MRSA and on vancomycin and plan for a 6 week course.  Plan for further debridement noted.   Continue vancomycin   2.  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  He is on minocycline despite the vancomycin to cover the Acinetobacter.

## 2023-01-30 NOTE — Progress Notes (Signed)
Pt had 65 beats of Vtach. Pt asymptomatic- notifed Os[place] (cards on call)

## 2023-01-30 NOTE — TOC Initial Note (Addendum)
Transition of Care (TOC) - Initial/Assessment Note    Patient Details  Name: Kevin Underwood. MRN: [id number] Date of Birth: 04/17/1961  Transition of Care [facility]) CM/SW Contact:    Erenest Rasher, RN Phone Number:[phone] 01/30/2023, 11:56 AM  Clinical Narrative:                 CM spoke to pt and gave permission to speak to mother. He is staying at his mother's home. Wife taks to appt. Pt has Medicaid and can use Medicaid transportation. Pt does not have benefit for [facility]. Pt may benefit from a longterm aide though his insurance. Mother states he would benefti from [place] with seat. Referral sent to [employer] rep, [name] for home IV abx.  Pt had [name] in the past. Contacted [name] rep, Levada Dy to review referral.    Expected Discharge Plan: [place] Barriers to Discharge: Continued Medical Work up   Patient Goals and CMS Choice Patient states their goals for this hospitalization and ongoing recovery are:: wants to feel better CMS Medicare.gov Compare Post Acute Care list provided to:: Patient Choice offered to / list presented to : Patient      Expected Discharge Plan and Services   Discharge Planning Services: CM Consult Post Acute Care Choice: [place] arrangements for the past 2 months: Single Family Home                           HH Arranged: RN          Prior Living Arrangements/Services Living arrangements for the past 2 months: Single Family Home Lives with:: Parents Patient language and need for interpreter reviewed:: Yes Do you feel safe going back to the place where you live?: Yes      Need for Family Participation in Patient Care: Yes (Comment) Care giver support system in place?: Yes (comment) Current home services: DME (LVAD, scale, nebulizer machine) Criminal Activity/Legal Involvement Pertinent to Current Situation/Hospitalization: No - Comment as needed  Activities of Daily Living      Permission  Sought/Granted Permission sought to share information with : Case Manager, Family Supports, PCP    Share Information with NAME: Steffanie Dunn     Permission granted to share info w Relationship: mother  Permission granted to share info w Contact Information: [phone]  Emotional Assessment Appearance:: Appears stated age Attitude/Demeanor/Rapport: Engaged Affect (typically observed): Accepting Orientation: : Oriented to Self, Oriented to Place, Oriented to  Time, Oriented to Situation   Psych Involvement: No (comment)  Admission diagnosis:  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) [id number]] 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 (LVAD) ([place]) [id number]] Patient Active Problem List   Diagnosis Date Noted   Pain and swelling of right wrist 09/30/2022   Hypotension 08/07/2022   MRSA infection    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 (LVAD) ([place])    Left ventricular assist device (LVAD) complication, subsequent encounter 05/13/2022   AKI (acute kidney injury) ([place]) 05/13/2022   Hyperthyroidism [id number]   Chronic systolic heart failure (HCC)    COPD (chronic obstructive pulmonary disease) ([place]) 09/09/2021   Malnutrition of moderate degree 11/17/2020   CHF (congestive heart failure), NYHA class IV, chronic, systolic ([place]) [id number]   Palliative care by specialist    Goals of care, counseling/discussion    Hyperlipidemia    CHF exacerbation ([place]) 09/23/2020   Unstable angina ([place]) 09/22/2020  LVAD (left ventricular assist device) present ([place])    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   Cardiogenic shock (HCC)    Acute respiratory failure (HCC)    RUQ abdominal pain    Cardiomyopathy ([place]) [id number]   Acute systolic CHF (congestive heart failure) ([place]) 11/21/2019   History of alcohol abuse Quit 6 mos ago 11/21/2019   History of recreational drug use 11/21/2019   PAF (paroxysmal atrial fibrillation) ([place])  11/21/2019   Cardiac volume overload 11/21/2019   Elevated brain natriuretic peptide (BNP) level 11/21/2019   Leg edema, right 11/21/2019   Hyponatremia 11/21/2019   Abnormal electrocardiogram (ECG) (EKG) 11/21/2019   Hyperglycemia 11/21/2019   Tremulousness 11/21/2019   Restlessness and agitation 11/21/2019   OSA (obstructive sleep apnea) 11/21/2019   Atrial fibrillation with RVR ([place]) 11/20/2019   Acute congestive heart failure ([place]) 11/20/2019   Acute respiratory distress 11/20/2019   COPD with acute exacerbation ([place])    Hypertension    Type 2 diabetes mellitus ([place])    Closed fracture of 5th metacarpal 08/31/2012   PCP:  Ralph Leyden, FNP Pharmacy:   [id number] [place] [id number]: [phone]: [phone]    Social Determinants of Health ([place] Social History: [place] No Food Insecurity (09/10/2021)  Housing: Low Risk  (09/10/2021)  Transportation Needs: Unmet Transportation Needs (09/10/2021)  Alcohol Screen: Low Risk  (11/10/2020)  Depression (PHQ2-9): Low Risk  (10/16/2022)  Financial Resource Strain: Medium Risk (05/17/2022)  Physical Activity: Inactive (11/10/2020)  Tobacco Use: Medium Risk (01/29/2023)   SDOH Interventions:     Readmission Risk Interventions    07/01/2022    9:05 AM 11/28/2020   11:57 AM  Readmission Risk Prevention Plan  Transportation Screening Complete Complete  Medication Review (RN Care Manager) Complete Complete  PCP or Specialist appointment within 3-5 days of discharge Complete   HRI or [place] Complete  SW Recovery Care/Counseling Consult Complete Complete  Palliative Care Screening Not Applicable Not [place] Applic[place] Not Applicable

## 2023-01-30 NOTE — Progress Notes (Signed)
ANTICOAGULATION CONSULT NOTE  Pharmacy Consult for Heparin > warfarin  Indication: LVAD  No Known Allergies  Patient Measurements: Height: 5' 10"$  (177.8 cm) Weight: 88.9 kg (195 lb 15.8 oz) IBW/kg (Calculated) : 73   Vital Signs: Temp: 98.3 F (36.8 C) (02/22 1000) Temp Source: Oral (02/22 1000) BP: 112/93 (02/22 0726) Pulse Rate: 93 (02/22 1000)  Labs: Recent Labs    01/28/23 0033 01/29/23 0017 01/30/23 0537  HGB 10.5* 11.0* 11.3*  HCT 30.3* 33.1* 33.4*  PLT 189 186 190  LABPROT 14.1 13.7 13.7  INR 1.1 1.1 1.1  HEPARINUNFRC <0.10* <0.10* <0.10*  CREATININE 1.37* 1.18 1.04     Estimated Creatinine Clearance: 82.7 mL/min (by C-G formula based on SCr of 1.04 mg/dL).   Medical History: Past Medical History:  Diagnosis Date   "    Arthritis    CAD (coronary artery disease)    a. s/p CABG in 11/2019 with LIMA-LAD, SVG-OM1, SVG-PDA and SVG-D1   CHF (congestive heart failure) (HCC)    a. EF < 20% by echo in 11/2019   Essential hypertension    PAF (paroxysmal atrial fibrillation) (HCC)    Type 2 diabetes mellitus (HCC)     Medications:   heparin 500 Units/hr (01/29/23 1815)   vancomycin 1,000 mg (01/30/23 0925)    Assessment: 62 yo male on chronic Coumadin for LVAD, poor compliance with outpatient checks.  INR on admission 1.1.  Previous Coumadin dose 6 mg daily except 4 mg on Tues, Thurs.  S/p OR 2/20 for drive line infection debridement.  Restart fixed low dose heparin gtt at 500 units/hr no up titration.  Restart warfarin 2/20 with goal INR about 1.8-2.2 with planned debridement next week.    CBC stable, no overt bleeding or complications noted.  Planning to continue aspirin 325 mg until INR closer to 2, then drop to 81 mg.  Already received warfarin 2.5 mg at 0830 AM this morning.  Goal of Therapy:  INR 2-2.5 Monitor platelets by anticoagulation protocol: Yes   Plan:  Continue IV heparin at 500 units/hr  Add additional 1 mg of warfarin this  afternoon. Daily cbc, heparin level and INR   [place] Crane, Roylene Reason, [facility] Clinical Pharmacist  01/30/2023 12:28 PM   [facility] pharmacy phone numbers are listed on [email or web address]   [facility] pharmacy phone numbers are listed on [email or web address]

## 2023-01-30 NOTE — Progress Notes (Signed)
LVAD Coordinator Rounding Note:  Admitted 01/27/23 to Heart Failure service from ED due to driveline infection. Started on IV antibiotics.   Pt sitting up on the edge of the bed with difficulty breathing. Pt is "belly breathing" and has audible wheezes. Sats were 80 when I entered the room. Pt was in obvious distress. Pt placed on NRB, nurse in the room giving IV Lasix and duoneb. Dr Aundra Dubin and Dedra Skeens notified. Chest xray ordered.  S/p driveline debridement in OR. Wound vac dressing clean, dry, intact. No alarms or air leak noted. Scant serosanguinous drainage in canister. Plan to return to OR for washout 02/05/23 per Dr Prescott Gum.   Pt will need wound vac and home IV antibiotics at discharge. ID is consulted. Plan to continue Vancomycin and Meropenem per Dr Linus Salmons note 2/20.   Controller electively exchanged this admission. No further low voltage hazard alarms seen on interrogation.  Vital signs: Temp:  98.5 HR: 105 Doppler Pressure: 110 Automatic BP: 112/93 (100) O2 Sat: 97% on NRB Wt: 192.9>197>195.9 lbs   LVAD interrogation reveals:  Speed: 5600 Flow: 4.5 Power: 4.1 w PI: 3 Alarms: none Events: 10 today; 50 yesterday Hct: 33  Fixed speed:  5600 Low speed limit:  5300  Drive Line:  Wound vac dressing clean, dry, intact. No alarms or air leak noted. Scant serosanguinous drainage in canister. Suction -100. Plan for washout and wound vac change in OR 02/05/23 per Dr Prescott Gum.   Labs:  LDH trend: 203>187>142>145  INR trend: 1.1>1.1  Anticoagulation Plan: -INR Goal: 2.0 - 2.5 -ASA Dose: none -Heparin 500 u/hr   ICD: N/A  Infection:  01/27/23>> blood cultures>> no growth 3 days 01/27/23>>driveline culture>>FEW METHICILLIN RESISTANT STAPHYLOCOCCUS AUREUS  01/28/23>>driveline cx from OR>>FEW STAPHYLOCOCCUS AUREUS   Plan/Recommendations:  Page VAD coordinator with equipment issues or driveline problems Plan for to return to OR next Wednesday for washout with Dr Lucianne Lei  Deberah Castle RN [place] Coordinator  Office: [phone]  24/7 Pager: [phone]

## 2023-01-30 NOTE — Care Management Important Message (Signed)
Important Message  Patient Details  Name: Kevin Underwood. MRN: [id number] Date of Birth: 09-May-1961   Medicare Import Message Given:  Yes Patient has a contact precaution order in place will mail copy of IM to home Tried to call  patient room and cell phone     Orbie Pyo 01/30/2023, 3:17 PM

## 2023-01-31 DIAGNOSIS — Z95811 Presence of heart assist device: Secondary | ICD-10-CM | POA: Diagnosis not present

## 2023-01-31 DIAGNOSIS — T829XXD Unspecified complication of cardiac and vascular prosthetic device, implant and graft, subsequent encounter: Secondary | ICD-10-CM | POA: Diagnosis not present

## 2023-01-31 LAB — BASIC METABOLIC PANEL
Anion gap: 13 (ref 5–15)
BUN: 14 mg/dL (ref 8–23)
CO2: 27 mmol/L (ref 22–32)
Calcium: 9.1 mg/dL (ref 8.9–10.3)
Chloride: 93 mmol/L — ABNORMAL LOW (ref 98–111)
Creatinine, Ser: 1.21 mg/dL (ref 0.61–1.24)
GFR, Estimated: >60 mL/min (ref >60)
Glucose, Bld: 161 mg/dL — ABNORMAL HIGH (ref 70–99)
Potassium: 4.1 mmol/L (ref 3.5–5.1)
Sodium: 133 mmol/L — ABNORMAL LOW (ref 135–145)

## 2023-01-31 LAB — CBC
HCT: 36.5 % — ABNORMAL LOW (ref 39.0–52.0)
Hemoglobin: 12 g/dL — ABNORMAL LOW (ref 13.0–17.0)
MCH: 28.2 pg (ref 26.0–34.0)
MCHC: 32.9 g/dL (ref 30.0–36.0)
MCV: 85.7 fL (ref 80.0–100.0)
Platelets: 192 10*3/uL (ref 150–400)
RBC: 4.26 MIL/uL (ref 4.22–5.81)
RDW: 14.2 % (ref 11.5–15.5)
WBC: 4.4 10*3/uL (ref 4.0–10.5)
nRBC: 0 % (ref 0.0–0.2)

## 2023-01-31 LAB — GLUCOSE, CAPILLARY
Glucose-Capillary: 198 mg/dL — ABNORMAL HIGH (ref 70–99)
Glucose-Capillary: 147 mg/dL — ABNORMAL HIGH (ref 70–99)
Glucose-Capillary: 167 mg/dL — ABNORMAL HIGH (ref 70–99)
Glucose-Capillary: 223 mg/dL — ABNORMAL HIGH (ref 70–99)

## 2023-01-31 LAB — LACTATE DEHYDROGENASE: LDH: 139 U/L (ref 98–192)

## 2023-01-31 LAB — PROTIME-INR
INR: 1.1 (ref 0.8–1.2)
Prothrombin Time: 13.6 seconds (ref 11.4–15.2)

## 2023-01-31 LAB — HEPARIN LEVEL (UNFRACTIONATED): Heparin Unfractionated: <0.1 IU/mL — ABNORMAL LOW (ref 0.30–0.70)

## 2023-01-31 MED ORDER — IPRATROPIUM BROMIDE 0.02 % IN SOLN: 0.5000 mg | Freq: Four times a day (QID) | RESPIRATORY_TRACT | Status: DC | PRN

## 2023-01-31 MED ORDER — AMIODARONE HCL IN DEXTROSE 360-4.14 MG/200ML-% IV SOLN
60.0000 mg/h | INTRAVENOUS | Status: AC
  Administered 2023-01-31: 60 mg/h via INTRAVENOUS
  Filled 2023-01-31 (×2): qty 200

## 2023-01-31 MED ORDER — WARFARIN SODIUM 3 MG PO TABS: 6.0000 mg | ORAL_TABLET | Freq: Once | ORAL | Status: DC

## 2023-01-31 MED ORDER — AMIODARONE HCL IN DEXTROSE 360-4.14 MG/200ML-% IV SOLN
30.0000 mg/h | INTRAVENOUS | Status: DC
  Administered 2023-01-31 – 2023-02-01 (×2): 30 mg/h via INTRAVENOUS
  Filled 2023-01-31 (×2): qty 200

## 2023-01-31 MED ORDER — WARFARIN SODIUM 4 MG PO TABS
4.0000 mg | ORAL_TABLET | Freq: Once | ORAL | Status: AC
  Administered 2023-01-31: 4 mg via ORAL
  Filled 2023-01-31: qty 1

## 2023-01-31 NOTE — Progress Notes (Addendum)
Advanced Heart Failure VAD Team Note  PCP-Cardiologist: Rozann Lesches, MD  AHF: Dr. Aundra Dubin   Subjective:   On Vanc . Meropenem completed 2/21. BCx NGTD. Wound Cx with MRSA. Denies fever/chills. AF.   OR for debridement w/ wound vac placement 2/20. Plan to return to OR for washout 2/28.  Now back on warfarin + heparin, INR remains low at 1.1  Diuresed well with 40 IV lasix BID. -5lbs  Feels better this am. Denies CP/SOB. Back on room air.   LVAD INTERROGATION:  HeartMate III LVAD:   Flow 4.6 liters/min, speed 5600, power 3.8, PI 3.9.  unable to interrogate device "no records available"   Objective:    Vital Signs:   Temp:  [98 F (36.7 C)-98.5 F (36.9 C)] 98 F (36.7 C) (02/22 2300) Pulse Rate:  [87-105] 96 (02/22 2300) Resp:  [16-21] 21 (02/22 2300) BP: (109-123)/(58-98) 111/95 (02/23 0549) SpO2:  [94 %-99 %] 95 % (02/22 2300) Weight:  [86.6 kg] 86.6 kg (02/23 0539) Last BM Date : 01/26/23 Mean arterial Pressure 90s  Intake/Output:   Intake/Output Summary (Last 24 hours) at 01/31/2023 0711 Last data filed at 01/31/2023 0540 Gross per 24 hour  Intake 367.9 ml  Output 2275 ml  Net -1907.1 ml    Physical Exam    General:  Well appearing. No resp difficulty HEENT: Normal Neck: supple. No JVP. Carotids 2+ bilat; no bruits. No lymphadenopathy or thyromegaly appreciated. Cor: Mechanical heart sounds with LVAD hum present. Lungs: wheezy throughout Abdomen: soft, nontender, nondistended. No hepatosplenomegaly. No bruits or masses. Good bowel sounds. Driveline: C/D/I; securement device intact and driveline incorporated Extremities: no cyanosis, clubbing, rash, edema Neuro: alert & orientedx3, cranial nerves grossly intact. moves all 4 extremities w/o difficulty. Affect pleasant   Telemetry   NSR low 100s, multiple runs of NSVT yesterday/this morning (Personally reviewed)    EKG   No new EKG  Labs   Basic Metabolic Panel: Recent Labs  Lab 01/28/23 0033  01/29/23 0017 01/30/23 0537 01/30/23 2242 01/31/23 0030  NA 135 135 133* 132* 133*  K 3.9 4.0 3.9 3.9 4.1  CL 102 104 98 93* 93*  CO2 21* '25 24 25 27  '$ GLUCOSE 175* 164* 157* 145* 161*  BUN 27* '20 12 15 14  '$ CREATININE 1.37* 1.18 1.04 1.13 1.21  CALCIUM 8.5* 8.5* 8.8* 9.0 9.1  MG 1.9  --   --  1.9  --    Liver Function Tests: Recent Labs  Lab 01/27/23 0846  AST 18  ALT 17  ALKPHOS 101  BILITOT 0.5  PROT 6.5  ALBUMIN 3.3*   CBC: Recent Labs  Lab 01/27/23 0846 01/28/23 0033 01/29/23 0017 01/30/23 0537 01/31/23 0030  WBC 8.0 5.9 6.4 5.9 4.4  HGB 10.9* 10.5* 11.0* 11.3* 12.0*  HCT 32.8* 30.3* 33.1* 33.4* 36.5*  MCV 86.8 85.6 86.4 84.8 85.7  PLT 199 189 186 190 192   INR: Recent Labs  Lab 01/27/23 0846 01/28/23 0033 01/29/23 0017 01/30/23 0537 01/31/23 0030  INR 1.1 1.1 1.1 1.1 1.1    Other results: EKG:   Imaging   DG CHEST PORT 1 VIEW  Result Date: 01/30/2023 CLINICAL DATA:  62 year old male presenting for evaluation of shortness of breath. EXAM: PORTABLE CHEST 1 VIEW COMPARISON:  January 27, 2023. FINDINGS: LVAD in place. Post median sternotomy. Stable cardiomediastinal contours. No signs of pulmonary edema, pleural effusion or consolidative change. On limited assessment no acute skeletal process. IMPRESSION: No acute cardiopulmonary disease. Electronically Signed   By:  Zetta Bills M.D.   On: 01/30/2023 08:47    Medications:     Scheduled Medications:  allopurinol  100 mg Oral Daily   aspirin EC  325 mg Oral Daily   Chlorhexidine Gluconate Cloth  6 each Topical Q0600   feeding supplement (GLUCERNA SHAKE)  237 mL Oral TID WC   glipiZIDE  5 mg Oral Q breakfast   guaiFENesin  600 mg Oral BID   insulin aspart  0-24 Units Subcutaneous TID WC   insulin aspart  0-5 Units Subcutaneous QHS   ipratropium-albuterol  3 mL Nebulization Q6H   loratadine  10 mg Oral Daily   metFORMIN  500 mg Oral BID WC   metoprolol succinate  25 mg Oral Daily    minocycline  200 mg Oral BID   multivitamin with minerals  1 tablet Oral Q24H   mupirocin ointment  1 Application Nasal BID   pantoprazole  40 mg Oral Daily   rosuvastatin  20 mg Oral Daily   sacubitril-valsartan  1 tablet Oral BID   sertraline  50 mg Oral Daily   tamsulosin  0.4 mg Oral Daily   thiamine (VITAMIN B1) injection  100 mg Intravenous Daily   traZODone  150 mg Oral QHS   Warfarin - Pharmacist Dosing Inpatient   Does not apply q1600   zinc sulfate  220 mg Oral Daily    Infusions:  heparin 500 Units/hr (01/30/23 2256)   vancomycin 200 mL/hr at 01/30/23 2256    PRN Medications: acetaminophen, colchicine, fluticasone, levalbuterol, ondansetron (ZOFRAN) IV, oxyCODONE   Patient Profile  62 y.o. 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 type 2 diabetes mellitus, COPD, hypertension, and HMIII VAD.    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  Assessment/Plan:   1. LVAD Complication---> Recurrent driveline infection  MRSA driveline infection, admitted in 6/23 and again in 7/23-8/23 with multiple trips to the OR for debridement. He has completed vancomycin and was on long-term doxycycline. Most recent culture grew acinetobacter and MRSA. ID recommended minocycline. He had been off minocycline for at least 3 days prior to this admit w/ copious drainage around driveline. CT chest/abd/pelvis showed stranding around the driveline and small amount of fluid. Repeat blood cx obtained. Started on Vanc and Meropenum per ID. Now off meropenum (2/21) - BCx 2/19 NGTD. Cont Abx per ID  - wound cultures 2/20: few gram + cocci - OR for debridement w/ wound vac placement 2/20 - Plan to return to OR for washout 2/28. Keep INR 1.8-2.2 until then   2. Chronic HFrEF Echo 10/21 with EF 20-25%,  mildly decreased RV function. LHC/RHC in 12/21 with patent grafts, low output. Suspect mixed ischemic/nonischemic cardiomyopathy (prior heavy ETOH and drugs as well as CAD).  No ETOH, drugs, smoking since CABG in 12/20. Admitted with cardiogenic shock in 12/21, had placement of Impella 5.5 initially, now s/p Heartmate 3 LVAD on 11/17/20.  Ramp echo 2/23 with speed decreased to 5500 rpm  - Diuresed well yesterday with 40 IV lasix BID. Volume appears stable today, no further diuresis - Warfarin restarted, goal INR 1.8-2.2 pending repeat surgery. Covering w/ heparin gtt - LDH 203>>187>142>145>139. Follow daily.  - Not a candidate for transplant with ongoing drug use.    3. HTN  - MAPs elevated low 100s on admit, poor compliance - Improved w/  restart of home meds, MAPs 80s. Will have RN recheck this am. - Toprol XL 25 mg daily  - 2/21 with asymptomatic hypotension (VAD MAP in 60s), suspected 2/2 oxy + BP meds. Hydralazine and Entresto held. MAPS elevated again this morning, continue Entresto (will monitor with diuresis) . Keep off hydralazine for now. Pain regimen spaced out a Q4>Q6   4. Polysubstance Abuse -Recently started using meth again. -UDS + for amphetamines. Monitor for withdrawal -SW team following. Unfortunately, limited resources for inpatient substance use due LVAD   5. PAF - S/p Maze and LA appendage clip with CABG in 12/20.   - back on coumadin   6. COPD - breathing improving but still very wheezy - Will d/c duoneb - start Atrovent and continue levalbuterol    7. DMII - SSI   8.  NSVT - Multiple runs NSVT this morning suspect he may be a little dry? - Start amiodarone gtt, monitor ectopy - Keep K >4, Mg >2   I reviewed the LVAD parameters from today, and compared the results to the patient's prior recorded data.  No programming changes were made.  The LVAD is functioning within specified parameters.  The patient performs LVAD self-test daily.  LVAD interrogation was  negative for any significant power changes, alarms or PI events/speed drops.  LVAD equipment check completed and is in good working order.  Back-up equipment present.   LVAD education done on emergency procedures and precautions and reviewed exit site care.  Length of Stay: [place], NP 01/31/2023, 7:11 AM  VAD Team --- VAD ISSUES ONLY--- Pager [phone] (7am - 7am)  Advanced Heart Failure Team  Pager [phone] (M-F; 7a - 5p)  Please contact [facility] for night-coverage after hours (5p -7a ) and weekends on [email or web address]  Patient seen with NP, agree with the above note.   He was diuresed aggressively yesterday with respiratory distress, weight down.    Multiple PI events since then.  He has also had frequent NSVT runs.  Amiodarone was started.   General: Well appearing this am. NAD.  HEENT: Normal. Neck: Supple, JVP 7-8 cm. Carotids OK.  Cardiac:  Mechanical heart sounds with LVAD hum present.  Lungs:  Bilateral wheezes. Abdomen:  NT, ND, no HSM. No bruits or masses. +BS  LVAD exit site: Well-healed and incorporated. Dressing dry and intact. No erythema or drainage. Stabilization device present and accurately applied. Driveline dressing changed daily per sterile technique. Extremities:  Warm and dry. No cyanosis, clubbing, rash, or edema.  Neuro:  Alert & oriented x 3. Cranial nerves grossly intact. Moves all 4 extremities w/o difficulty. Affect pleasant    Good diuresis yesterday, weight down.  Suspect on the dry side now with increased PI events.   Runs of NSVT today, may be due to diuresis yesterday.  Many PI events as above.  Will start amiodarone gtt for now to control, would avoid continuing long-term with COPD.    Still with wheezing but breathing better.  Continue Atrovent and levalbuterol nebs for COPD, will try to avoid prednisone given driveline site wound.    BP stable on current regimen.    Continue vancomycin for MRSA driveline infection + minocycline for  acinetobacter.  Will need 6 wks vancomycin then back to minocycline alone.  Will stay here until 2/28 when he will have wound vac change in OR.    Continue low dose heparin and run INR 1.8-2.2 on warfarin until he returns to OR.   Blondina Coderre  Aundra Dubin 01/31/2023 1:56 PM

## 2023-01-31 NOTE — TOC Progression Note (Signed)
Transition of Care (TOC) - Progression Note    Patient Details  Name: Kevin Underwood. MRN: [id number] Date of Birth: 02/24/1961  Transition of Care [facility]) CM/SW [place], [place] Phone Number: 01/31/2023, 2:10 PM  Clinical Narrative:     CSW notified by medical team that pt will need IV ABX, and will be medically ready to DC next week. Pt with an LVAD and substance use. Team requesting SNF placement at DC. CSW notified team of barriers, but faxed pt out for potential offers. [facility] declined, [facility] stated they would have higher up's review, but likely a no. [facility] has not responded. CSW to complete workup and follow for DC needs.   Expected Discharge Plan: [place] Barriers to Discharge: Continued Medical Work up  Expected Discharge Plan and Services   Discharge Planning Services: CM Consult Post Acute Care Choice: [place] arrangements for the past 2 months: [place]: RN           Social Determinants of Health (SDOH) Interventions [employer]: No Food Insecurity (09/10/2021)  Housing: Low Risk  (09/10/2021)  Transportation Needs: Unmet Transportation Needs (09/10/2021)  Alcohol Screen: Low Risk  (11/10/2020)  Depression (PHQ2-9): Low Risk  (10/16/2022)  Financial Resource Strain: Medium Risk (05/17/2022)  Physical Activity: Inactive (11/10/2020)  Tobacco Use: Medium Risk (01/29/2023)    Readmission Risk Interventions    07/01/2022    9:05 AM 11/28/2020   11:57 AM  Readmission Risk Prevention Plan  Transportation Screening Complete Complete  Medication Review Press photographer) Complete Complete  PCP or Specialist appointment within 3-5 days of discharge Complete   HRI or [place] Complete Complete  SW Recovery Care/Counseling Consult Complete Complete  [place] Not Applicable Not [place]  Not Applicable Not Applicable

## 2023-01-31 NOTE — Progress Notes (Signed)
[facility] for Infectious Disease   Reason for visit: Follow up on driveline infection  Interval History: blood cultures remain ngtd; WBC 4.4.  no fever.  No rash or diarrhea.  Day 5 antibiotics  Physical Exam: Constitutional:  Vitals:   01/31/23 0829 01/31/23 1149  BP: (!) 80/65 99/86  Pulse: 91 (!) 106  Resp: 20 19  Temp: 98.7 F (37.1 C) 98.4 F (36.9 C)  SpO2: 95% 99%  He is in nad Eyes: anicteric Pulmonary: normal respiratory effort  Review of Systems: Constitutional: negative for fever or chills  Lab Results  Component Value Date   WBC 4.4 01/31/2023   HGB 12.0 (L) 01/31/2023   HCT 36.5 (L) 01/31/2023   MCV 85.7 01/31/2023   PLT 192 01/31/2023    Lab Results  Component Value Date   CREATININE 1.21 01/31/2023   BUN 14 01/31/2023   NA 133 (L) 01/31/2023   K 4.1 01/31/2023   CL 93 (L) 01/31/2023   CO2 27 01/31/2023    Lab Results  Component Value Date   ALT 17 01/27/2023   AST 18 01/27/2023   ALKPHOS 101 01/27/2023     Microbiology: Recent Results (from the past 240 hour(s))  Blood culture (routine x 2)     Status: None (Preliminary result)   Collection Time: 01/27/23  8:45 AM   Specimen: BLOOD  Result Value Ref Range Status   Specimen Description BLOOD LEFT ANTECUBITAL  Final   Special Requests   Final    BOTTLES DRAWN AEROBIC AND ANAEROBIC Blood Culture adequate volume   Culture   Final    NO GROWTH 4 DAYS Performed at [facility], [address], [place], [place] [address]    Report Status PENDING  Incomplete  Aerobic Culture w Gram Stain (superficial specimen)     Status: None   Collection Time: 01/27/23  8:45 AM   Specimen: Wound  Result Value Ref Range Status   Specimen Description WOUND  Final   Special Requests NONE  Final   Gram Stain   Final    MODERATE WBC PRESENT, PREDOMINANTLY PMN RARE GRAM POSITIVE COCCI IN PAIRS Performed at [facility], [place] [address] [place], [place] [address]    Culture FEW  METHICILLIN RESISTANT STAPHYLOCOCCUS AUREUS  Final   Report Status 01/29/2023 FINAL  Final   Organism ID, Bacteria METHICILLIN RESISTANT STAPHYLOCOCCUS AUREUS  Final      Susceptibility   Methicillin resistant staphylococcus aureus - MIC*    CIPROFLOXACIN >=8 RESISTANT Resistant     ERYTHROMYCIN >=8 RESISTANT Resistant     GENTAMICIN <=0.5 SENSITIVE Sensitive     OXACILLIN >=4 RESISTANT Resistant     TETRACYCLINE <=1 SENSITIVE Sensitive     VANCOMYCIN 1 SENSITIVE Sensitive     TRIMETH/SULFA >=320 RESISTANT Resistant     CLINDAMYCIN <=0.25 SENSITIVE Sensitive     RIFAMPIN <=0.5 SENSITIVE Sensitive     Inducible Clindamycin NEGATIVE Sensitive     * FEW METHICILLIN RESISTANT STAPHYLOCOCCUS AUREUS  Blood culture (routine x 2)     Status: None (Preliminary result)   Collection Time: 01/27/23  8:50 AM   Specimen: BLOOD RIGHT FOREARM  Result Value Ref Range Status   Specimen Description BLOOD RIGHT FOREARM  Final   Special Requests   Final    BOTTLES DRAWN AEROBIC AND ANAEROBIC Blood Culture results may not be optimal due to an excessive volume of blood received in culture bottles   Culture   Final  NO GROWTH 4 DAYS Performed at [facility], [place] [address] [place], [place] [address]    Report Status PENDING  Incomplete  MRSA Next Gen by PCR, Nasal     Status: Abnormal   Collection Time: 01/27/23  3:00 PM   Specimen: Nasal Mucosa; Nasal Swab  Result Value Ref Range Status   MRSA by PCR Next Gen DETECTED (A) NOT DETECTED Final    Comment: RESULT CALLED TO, READ BACK BY AND VERIFIED WITH: C/ A. [facility], RN 01/27/23 1815 A. LAFRANCE (NOTE) The GeneXpert MRSA Assay (FDA approved for NASAL specimens only), is one component of a comprehensive MRSA colonization surveillance program. It is not intended to diagnose MRSA infection nor to guide or monitor treatment for MRSA infections. Test performance is not FDA approved in patients less than 50 years old. Performed at [facility], [place] [address] [place], [place] [address]   Aerobic/Anaerobic Culture w Gram Stain (surgical/deep wound)     Status: None (Preliminary result)   Collection Time: 01/28/23  2:14 PM   Specimen: Other Source; Body Fluid  Result Value Ref Range Status   Specimen Description WOUND ABDOMEN  Final   Special Requests PT ON VANC  Final   Gram Stain   Final    FEW WBC PRESENT,BOTH PMN AND MONONUCLEAR FEW GRAM POSITIVE COCCI IN PAIRS IN SINGLES Performed at [facility], [place] [address] [place], [place] [address]    Culture   Final    FEW METHICILLIN RESISTANT STAPHYLOCOCCUS AUREUS NO ANAEROBES ISOLATED; CULTURE IN PROGRESS FOR 5 DAYS    Report Status PENDING  Incomplete   Organism ID, Bacteria METHICILLIN RESISTANT STAPHYLOCOCCUS AUREUS  Final      Susceptibility   Methicillin resistant staphylococcus aureus - MIC*    CIPROFLOXACIN 4 RESISTANT Resistant     ERYTHROMYCIN >=8 RESISTANT Resistant     GENTAMICIN <=0.5 SENSITIVE Sensitive     OXACILLIN >=4 RESISTANT Resistant     TETRACYCLINE <=1 SENSITIVE Sensitive     VANCOMYCIN 1 SENSITIVE Sensitive     TRIMETH/SULFA >=320 RESISTANT Resistant     CLINDAMYCIN <=0.25 SENSITIVE Sensitive     RIFAMPIN <=0.5 SENSITIVE Sensitive     Inducible Clindamycin NEGATIVE Sensitive     * FEW METHICILLIN RESISTANT STAPHYLOCOCCUS AUREUS    Impression/Plan:  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  On vancomycin and no issues.  Will need prolonged course for 6 weeks followed by suppression as before.   Plan for further debridement next week.   2.  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  Will also cover the MRSA for above as suppression after the 6 weeks.   3.  Substance abuse - uses methamphetamines and counseled.    Dr. Candiss Norse on over the weekend if needed, otherwise ID team will follow up next week.

## 2023-01-31 NOTE — Plan of Care (Signed)
  Problem: Education: Goal: Knowledge of General Education information will improve Description: Including pain rating scale, medication(s)/side effects and non-pharmacologic comfort measures Outcome: Progressing   Problem: Health Behavior/Discharge Planning: Goal: Ability to manage health-related needs will improve Outcome: Progressing   Problem: Clinical Measurements: Goal: Diagnostic test results will improve Outcome: Progressing Goal: Respiratory complications will improve Outcome: Progressing   Problem: Activity: Goal: Risk for activity intolerance will decrease Outcome: Progressing   Problem: Nutrition: Goal: Adequate nutrition will be maintained Outcome: Progressing   Problem: Coping: Goal: Level of anxiety will decrease Outcome: Progressing   Problem: Pain Managment: Goal: General experience of comfort will improve Outcome: Progressing

## 2023-01-31 NOTE — Progress Notes (Signed)
LVAD Coordinator Rounding Note:  Admitted 01/27/23 to Heart Failure service from ED due to driveline infection. Started on IV antibiotics.   Pt lying in bed. Pt states  S/p driveline debridement in OR. Wound vac dressing clean, dry, intact. No alarms or air leak noted. Scant serosanguinous drainage in canister. Plan to return to OR for washout 02/05/23 per Dr Prescott Gum.   Pt will need wound vac and home IV antibiotics at discharge. ID is consulted. Plan to continue Vancomycin and Meropenem per Dr Linus Salmons note 2/20.   Pt will need placement in SNF for IV antibiotics as he is living with his mom and she is unable to help him. I spoke with his wife this morning and her and Laverna Peace are at odds and not getting along. It would be much safer for him to be in a SNF post discharge for IV antibiotics.  Wound VAC with blockage this morning. Changed at bedside see below for full note.  Controller electively exchanged this admission. No further low voltage hazard alarms seen on interrogation.  Vital signs: Temp:  98.7 HR: 91 Doppler Pressure: 78 Automatic BP: 80/65 (71) O2 Sat: 95% on RA Wt: 192.9>197>195.9>190.9 lbs   LVAD interrogation reveals:  Speed: 5600 Flow: 4.8 Power: 4.1 w PI: 2.6 Alarms: none Events: 80 today Hct: 36  Fixed speed:  5600 Low speed limit:  5300  Drive Line:  Wound vac dressing clean, dry, intact. Blockage occurring. Dressing remove. Sponge intact. Cleaned area around sponge with betadine x 2. VAC dressing reapplied with suction bulb. Suction -100. Working well, no alarms. Plan for washout and wound vac change in OR 02/05/23 per Dr Prescott Gum.   Labs:  LDH trend: 203>187>142>145>139  INR trend: 1.1>1.1  Anticoagulation Plan: -INR Goal: 2.0 - 2.5 -ASA Dose: none -Heparin 500 u/hr   ICD: N/A  Infection:  01/27/23>> blood cultures>> no growth 4 days 01/27/23>>driveline culture>>FEW METHICILLIN RESISTANT STAPHYLOCOCCUS AUREUS  01/28/23>>driveline cx from OR>>FEW  STAPHYLOCOCCUS AUREUS   Plan/Recommendations:  Page VAD coordinator with equipment issues or driveline problems Plan for to return to OR next Wednesday for washout with Dr Lucianne Lei Deberah Castle RN [place] Coordinator  Office: [phone]  24/7 Pager: [phone]

## 2023-01-31 NOTE — Progress Notes (Signed)
ANTICOAGULATION CONSULT NOTE  Pharmacy Consult for Heparin > warfarin  Indication: LVAD  No Known Allergies  Patient Measurements: Height: '5\' 10"'$  (177.8 cm) Weight: 86.6 kg (190 lb 14.7 oz) IBW/kg (Calculated) : 73   Vital Signs: Temp: 98.4 F (36.9 C) (02/23 1149) Temp Source: Oral (02/23 1149) BP: 99/86 (02/23 1149) Pulse Rate: 106 (02/23 1149)  Labs: Recent Labs    01/29/23 0017 01/30/23 0537 01/30/23 2242 01/31/23 0030  HGB 11.0* 11.3*  --  12.0*  HCT 33.1* 33.4*  --  36.5*  PLT 186 190  --  192  LABPROT 13.7 13.7  --  13.6  INR 1.1 1.1  --  1.1  HEPARINUNFRC <0.10* <0.10*  --  <0.10*  CREATININE 1.18 1.04 1.13 1.21     Estimated Creatinine Clearance: 65.4 mL/min (by C-G formula based on SCr of 1.21 mg/dL).   Medical History: Past Medical History:  Diagnosis Date   "    Arthritis    CAD (coronary artery disease)    a. s/p CABG in 11/2019 with LIMA-LAD, SVG-OM1, SVG-PDA and SVG-D1   CHF (congestive heart failure) (HCC)    a. EF < 20% by echo in 11/2019   Essential hypertension    PAF (paroxysmal atrial fibrillation) (HCC)    Type 2 diabetes mellitus (HCC)     Medications:   amiodarone     heparin 500 Units/hr (01/31/23 1344)   vancomycin 1,000 mg (01/31/23 1030)    Assessment: 62 yo male on chronic Coumadin for LVAD, poor compliance with outpatient checks.  INR on admission 1.1.  Previous Coumadin dose 6 mg daily except 4 mg on Tues, Thurs.  S/p OR 2/20 for drive line infection debridement.  Restart fixed low dose heparin gtt at 500 units/hr no up titration.  Restart warfarin 2/20 with goal INR about 1.8-2.2 with planned debridement next week.    CBC stable, no overt bleeding or complications noted.  Planning to continue aspirin 325 mg until INR closer to 2, then drop to 81 mg.  INR still 1.1 today.  Goal of Therapy:  INR 2-2.5 Monitor platelets by anticoagulation protocol: Yes   Plan:  Continue IV heparin at 500 units/hr  Warfarin 4 mg  po x 1 today. Daily cbc, heparin level and INR   [place] Crane, Roylene Reason, [facility] Clinical Pharmacist  01/31/2023 2:58 PM   [facility] pharmacy phone numbers are listed on [email or web address]

## 2023-01-31 NOTE — Progress Notes (Signed)
PT Cancellation Note  Patient Details Name: Kevin Underwood. MRN: [id number] DOB: [birth year]   Cancelled Treatment:    Reason Eval/Treat Not Completed: Patient declined, no reason specified (pt reports too fatigued to participate at all today. Confirms desire for SNF but despite education and encouragement would not agree to mobility "I'll do it tomorrow")   Pegi Milazzo B Keneisha Heckart 01/31/2023, 11:12 AM Bayard Males, PT Acute Rehabilitation Services Office: [phone]

## 2023-02-01 DIAGNOSIS — T829XXD Unspecified complication of cardiac and vascular prosthetic device, implant and graft, subsequent encounter: Secondary | ICD-10-CM | POA: Diagnosis not present

## 2023-02-01 DIAGNOSIS — Z95811 Presence of heart assist device: Secondary | ICD-10-CM | POA: Diagnosis not present

## 2023-02-01 LAB — CULTURE, BLOOD (ROUTINE X 2)
Culture: NO GROWTH
Culture: NO GROWTH
Special Requests: ADEQUATE

## 2023-02-01 LAB — GLUCOSE, CAPILLARY
Glucose-Capillary: 175 mg/dL — ABNORMAL HIGH (ref 70–99)
Glucose-Capillary: 160 mg/dL — ABNORMAL HIGH (ref 70–99)
Glucose-Capillary: 166 mg/dL — ABNORMAL HIGH (ref 70–99)
Glucose-Capillary: 133 mg/dL — ABNORMAL HIGH (ref 70–99)

## 2023-02-01 LAB — BASIC METABOLIC PANEL
Anion gap: 11 (ref 5–15)
BUN: 26 mg/dL — ABNORMAL HIGH (ref 8–23)
CO2: 26 mmol/L (ref 22–32)
Calcium: 8.5 mg/dL — ABNORMAL LOW (ref 8.9–10.3)
Chloride: 95 mmol/L — ABNORMAL LOW (ref 98–111)
Creatinine, Ser: 1.31 mg/dL — ABNORMAL HIGH (ref 0.61–1.24)
GFR, Estimated: >60 mL/min (ref >60)
Glucose, Bld: 156 mg/dL — ABNORMAL HIGH (ref 70–99)
Potassium: 3.7 mmol/L (ref 3.5–5.1)
Sodium: 132 mmol/L — ABNORMAL LOW (ref 135–145)

## 2023-02-01 LAB — CBC
HCT: 33.2 % — ABNORMAL LOW (ref 39.0–52.0)
Hemoglobin: 11.4 g/dL — ABNORMAL LOW (ref 13.0–17.0)
MCH: 29 pg (ref 26.0–34.0)
MCHC: 34.3 g/dL (ref 30.0–36.0)
MCV: 84.5 fL (ref 80.0–100.0)
Platelets: 197 10*3/uL (ref 150–400)
RBC: 3.93 MIL/uL — ABNORMAL LOW (ref 4.22–5.81)
RDW: 14.1 % (ref 11.5–15.5)
WBC: 6.1 10*3/uL (ref 4.0–10.5)
nRBC: 0 % (ref 0.0–0.2)

## 2023-02-01 LAB — PROTIME-INR
INR: 1.2 (ref 0.8–1.2)
Prothrombin Time: 14.9 seconds (ref 11.4–15.2)

## 2023-02-01 LAB — LACTATE DEHYDROGENASE: LDH: 133 U/L (ref 98–192)

## 2023-02-01 LAB — HEPARIN LEVEL (UNFRACTIONATED): Heparin Unfractionated: <0.1 IU/mL — ABNORMAL LOW (ref 0.30–0.70)

## 2023-02-01 MED ORDER — WARFARIN SODIUM 3 MG PO TABS
6.0000 mg | ORAL_TABLET | Freq: Once | ORAL | Status: AC
  Administered 2023-02-01: 6 mg via ORAL
  Filled 2023-02-01: qty 2

## 2023-02-01 MED ORDER — IPRATROPIUM BROMIDE 0.02 % IN SOLN
0.5000 mg | Freq: Four times a day (QID) | RESPIRATORY_TRACT | Status: DC
  Administered 2023-02-01 – 2023-02-02 (×2): 0.5 mg via RESPIRATORY_TRACT
  Filled 2023-02-01 (×3): qty 2.5

## 2023-02-01 MED ORDER — AMIODARONE HCL 200 MG PO TABS
200.0000 mg | ORAL_TABLET | Freq: Two times a day (BID) | ORAL | Status: DC
  Administered 2023-02-01 – 2023-02-20 (×38): 200 mg via ORAL
  Filled 2023-02-01 (×38): qty 1
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
## 2023-02-01 NOTE — Progress Notes (Signed)
ANTICOAGULATION CONSULT NOTE  Pharmacy Consult for Heparin > warfarin  Indication: LVAD  No Known Allergies  Patient Measurements: Height: '5\' 10"'$  (177.8 cm) Weight: 86.5 kg (190 lb 11.2 oz) IBW/kg (Calculated) : 73   Vital Signs: Temp: 98 F (36.7 C) (02/24 1110) Temp Source: Oral (02/24 1110) BP: 90/76 (02/24 1100)  Labs: Recent Labs    01/30/23 0537 01/30/23 2242 01/31/23 0030 02/01/23 0032  HGB 11.3*  --  12.0* 11.4*  HCT 33.4*  --  36.5* 33.2*  PLT 190  --  192 197  LABPROT 13.7  --  13.6 14.9  INR 1.1  --  1.1 1.2  HEPARINUNFRC <0.10*  --  <0.10* <0.10*  CREATININE 1.04 1.13 1.21 1.31*     Estimated Creatinine Clearance: 60.4 mL/min (A) (by C-G formula based on SCr of 1.31 mg/dL (H)).   Medical History: Past Medical History:  Diagnosis Date   "    Arthritis    CAD (coronary artery disease)    a. s/p CABG in 11/2019 with LIMA-LAD, SVG-OM1, SVG-PDA and SVG-D1   CHF (congestive heart failure) (HCC)    a. EF < 20% by echo in 11/2019   Essential hypertension    PAF (paroxysmal atrial fibrillation) (HCC)    Type 2 diabetes mellitus (HCC)     Medications:   heparin 500 Units/hr (01/31/23 1828)   vancomycin 1,000 mg (02/01/23 0859)    Assessment: 62 yo male on chronic Coumadin for LVAD, poor compliance with outpatient checks.  INR on admission 1.1.  Previous Coumadin dose 6 mg daily except 4 mg on Tues, Thurs.  S/p OR 2/20 for drive line infection debridement.  Restart fixed low dose heparin gtt at 500 units/hr no up titration.  Restart warfarin 2/20 with goal INR about 1.8-2.2 with planned debridement next week.    CBC stable, no overt bleeding or complications noted.  Planning to continue aspirin 325 mg until INR closer to 2 then stop No long term as planned   INR 1.2 slight bump after warfarin restarted. Cbc stable   Goal of Therapy:  INR 2-2.5 Monitor platelets by anticoagulation protocol: Yes   Plan:  Continue IV heparin at 500 units/hr   Warfarin '6mg'$  po x 1 today. Daily cbc, heparin level and INR     Bonnita Nasuti Pharm.D. CPP, BCPS Clinical Pharmacist [phone] 02/01/2023 1:49 PM   [facility] pharmacy phone numbers are listed on [email or web address]
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
## 2023-02-01 NOTE — Progress Notes (Signed)
Patient ID: Kevin Millers., male   DOB: November 14, 1961, 61 y.o.   MRN: [id number]   Advanced Heart Failure VAD Team Note  PCP-Cardiologist: Rozann Lesches, MD  AHF: Dr. 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 On IV vancomycin.  OR for debridement w/ wound vac placement 2/20. Plan to return to OR for washout 2/28.  Now back on warfarin + heparin, INR remains low at 1.2  "Hurts all over" today.  Unable to localize.  Still some wheezing but on room air.   LVAD INTERROGATION:  HeartMate III LVAD:   Flow 4.5 liters/min, speed 5500, power 3.8, PI 5.  Many PI events, unchanged from past.    Objective:    Vital Signs:   Temp:  [98 F (36.7 C)-98.5 F (36.9 C)] 98 F (36.7 C) (02/24 0627) Pulse Rate:  [85-106] 85 (02/23 1606) Resp:  [19-20] 20 (02/23 1606) BP: (87-107)/(59-90) 107/75 (02/24 0627) SpO2:  [98 %-99 %] 99 % (02/23 1918) Weight:  [86.5 kg] 86.5 kg (02/24 0500) Last BM Date : 01/26/23 Mean arterial Pressure 90s  Intake/Output:   Intake/Output Summary (Last 24 hours) at 02/01/2023 0913 Last data filed at 02/01/2023 0846 Gross per 24 hour  Intake 1390.64 ml  Output 650 ml  Net 740.64 ml    Physical Exam    General: Well appearing this am. NAD.  HEENT: Normal. Neck: Supple, JVP 7-8 cm. Carotids OK.  Cardiac:  Mechanical heart sounds with LVAD hum present.  Lungs: Distant BS with occasional rhonchi.  Abdomen:  NT, ND, no HSM. No bruits or masses. +BS  LVAD exit site: Well-healed and incorporated. Dressing dry and intact. No erythema or drainage. Stabilization device present and accurately applied. Driveline dressing changed daily per sterile technique. Extremities:  Warm and dry. No cyanosis, clubbing, rash, or edema.  Neuro:  Alert & oriented x 3. Cranial nerves grossly intact. Moves all 4 extremities w/o difficulty. Affect pleasant     Telemetry   NSR 90s, PVCs (Personally reviewed)    EKG   No new EKG  Labs   Basic  Metabolic Panel: Recent Labs  Lab 01/28/23 0033 01/29/23 0017 01/30/23 0537 01/30/23 2242 01/31/23 0030 02/01/23 0032  NA 135 135 133* 132* 133* 132*  K 3.9 4.0 3.9 3.9 4.1 3.7  CL 102 104 98 93* 93* 95*  CO2 21* '25 24 25 27 26  '$ GLUCOSE 175* 164* 157* 145* 161* 156*  BUN 27* '20 12 15 14 '$ 26*  CREATININE 1.37* 1.18 1.04 1.13 1.21 1.31*  CALCIUM 8.5* 8.5* 8.8* 9.0 9.1 8.5*  MG 1.9  --   --  1.9  --   --    Liver Function Tests: Recent Labs  Lab 01/27/23 0846  AST 18  ALT 17  ALKPHOS 101  BILITOT 0.5  PROT 6.5  ALBUMIN 3.3*   CBC: Recent Labs  Lab 01/28/23 0033 01/29/23 0017 01/30/23 0537 01/31/23 0030 02/01/23 0032  WBC 5.9 6.4 5.9 4.4 6.1  HGB 10.5* 11.0* 11.3* 12.0* 11.4*  HCT 30.3* 33.1* 33.4* 36.5* 33.2*  MCV 85.6 86.4 84.8 85.7 84.5  PLT 189 186 190 192 197   INR: Recent Labs  Lab 01/28/23 0033 01/29/23 0017 01/30/23 0537 01/31/23 0030 02/01/23 0032  INR 1.1 1.1 1.1 1.1 1.2    Other results: EKG:   Imaging   No results found.  Medications:     Scheduled Medications:  allopurinol  100 mg Oral Daily  amiodarone  200 mg Oral BID   aspirin EC  325 mg Oral Daily   feeding supplement (GLUCERNA SHAKE)  237 mL Oral TID WC   glipiZIDE  5 mg Oral Q breakfast   guaiFENesin  600 mg Oral BID   insulin aspart  0-24 Units Subcutaneous TID WC   insulin aspart  0-5 Units Subcutaneous QHS   ipratropium  0.5 mg Nebulization QID   loratadine  10 mg Oral Daily   metFORMIN  500 mg Oral BID WC   metoprolol succinate  25 mg Oral Daily   minocycline  200 mg Oral BID   multivitamin with minerals  1 tablet Oral Q24H   pantoprazole  40 mg Oral Daily   rosuvastatin  20 mg Oral Daily   sacubitril-valsartan  1 tablet Oral BID   sertraline  50 mg Oral Daily   tamsulosin  0.4 mg Oral Daily   thiamine (VITAMIN B1) injection  100 mg Intravenous Daily   traZODone  150 mg Oral QHS   Warfarin - Pharmacist Dosing Inpatient   Does not apply q1600   zinc sulfate  220  mg Oral Daily    Infusions:  heparin 500 Units/hr (01/31/23 1828)   vancomycin 1,000 mg (02/01/23 0859)    PRN Medications: acetaminophen, colchicine, fluticasone, levalbuterol, ondansetron (ZOFRAN) IV, oxyCODONE   Patient Profile  62 y.o. 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 type 2 diabetes mellitus, COPD, hypertension, and HMIII VAD.    Admitted with recurrent LVAD complication driveline infection + suspected AECOPD   Assessment/Plan:    1. LVAD Complication---> Recurrent driveline infection: MRSA driveline infection, admitted in 6/23 and again in 7/23-8/23 with multiple trips to the OR for debridement. He had completed vancomycin and then long-term doxycycline. Most recent culture PTA grew acinetobacter and MRSA. ID started minocycline. He had been off minocycline for at least 3 days prior to this admit w/ copious drainage around driveline. CT chest/abd/pelvis showed stranding around the driveline and small amount of fluid. Started on Vanc and Meropenum per ID. Now off meropenem (2/21).  OR for debridement w/ wound vac placement 2/20. Wound culture with MRSA, blood cultures negative.  Afebrile.  - Will need 6 wks vancomycin then back to minocycline alone.   - Plan to return to OR for washout/vac change 2/28. Keep INR 1.8-2.2 until then.  2. Chronic HFrEF: Echo 10/21 with EF 20-25%, mildly decreased RV function. LHC/RHC in 12/21 with patent grafts, low output. Suspect mixed ischemic/nonischemic cardiomyopathy (prior heavy ETOH and drugs as well as CAD).  No ETOH, drugs, smoking since CABG in 12/20. Admitted with cardiogenic shock in 12/21, had placement of Impella 5.5 initially, now s/p Heartmate 3 LVAD on 11/17/20.  Ramp echo 2/23 with speed decreased to 5500 rpm.  Volume status looks ok today, cough and wheezing likely  related to COPD. LDH 133.  - No diuretic today.  - Warfarin restarted, goal INR 1.8-2.2 until surgery. Covering w/ heparin gtt - Not a candidate for transplant with ongoing drug use.  3. HTN: MAPs elevated low 100s on admit, poor compliance with meds. Improved w/ restart of home meds, MAPs around 90 today.. - Toprol XL 25 mg daily  - Continue Entresto 24/26 bid.  4. Polysubstance Abuse: Recently started using methamphetamine again.  UDS + for amphetamines.  - SW team following. Unfortunately, limited resources for  inpatient substance use due LVAD 5. PAF: S/p Maze and LA appendage clip with CABG in 12/20.  NSR this admission.  6. 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  Still some wheezing but has improved.   - Continue scheduled atrovent.  - Levalbuterol prn.  - Have avoided prednisone with surgery at driveline site for recurrent infection.   7. DMII - SSI  8.  NSVT/PVCs: Frequent NSVT 2/23, may have been dry after diuresis.  Amiodarone gtt started to suppress.  - Transition to po amiodarone 200 mg bid.  - Would eventually stop this medication, avoid with COPD.     I reviewed the LVAD parameters from today, and compared the results to the patient's prior recorded data.  No programming changes were made.  The LVAD is functioning within specified parameters.  The patient performs LVAD self-test daily.  LVAD interrogation was negative for any significant power changes, alarms or PI events/speed drops.  LVAD equipment check completed and is in good working order.  Back-up equipment present.   LVAD education done on emergency procedures and precautions and reviewed exit site care.  Length of Stay: [place], MD 02/01/2023, 9:13 AM  VAD Team --- VAD ISSUES ONLY--- Pager [phone] (7am - 7am)  Advanced Heart Failure Team  Pager [phone] (M-F; 7a - 5p)  Please contact [facility] for night-coverage after hours (5p -7a ) and weekends on [email or web address]

## 2023-02-02 ENCOUNTER — Other Ambulatory Visit: Payer: Self-pay | Admitting: Infectious Diseases

## 2023-02-02 DIAGNOSIS — T829XXD Unspecified complication of cardiac and vascular prosthetic device, implant and graft, subsequent encounter: Secondary | ICD-10-CM | POA: Diagnosis not present

## 2023-02-02 DIAGNOSIS — Z95811 Presence of heart assist device: Secondary | ICD-10-CM | POA: Diagnosis not present

## 2023-02-02 LAB — CBC
HCT: 31.7 % — ABNORMAL LOW (ref 39.0–52.0)
Hemoglobin: 11.1 g/dL — ABNORMAL LOW (ref 13.0–17.0)
MCH: 29 pg (ref 26.0–34.0)
MCHC: 35 g/dL (ref 30.0–36.0)
MCV: 82.8 fL (ref 80.0–100.0)
Platelets: 184 10*3/uL (ref 150–400)
RBC: 3.83 MIL/uL — ABNORMAL LOW (ref 4.22–5.81)
RDW: 14.2 % (ref 11.5–15.5)
WBC: 5 10*3/uL (ref 4.0–10.5)
nRBC: 0 % (ref 0.0–0.2)

## 2023-02-02 LAB — GLUCOSE, CAPILLARY
Glucose-Capillary: 141 mg/dL — ABNORMAL HIGH (ref 70–99)
Glucose-Capillary: 141 mg/dL — ABNORMAL HIGH (ref 70–99)
Glucose-Capillary: 160 mg/dL — ABNORMAL HIGH (ref 70–99)
Glucose-Capillary: 176 mg/dL — ABNORMAL HIGH (ref 70–99)

## 2023-02-02 LAB — BASIC METABOLIC PANEL
Anion gap: 9 (ref 5–15)
BUN: 35 mg/dL — ABNORMAL HIGH (ref 8–23)
CO2: 25 mmol/L (ref 22–32)
Calcium: 8.1 mg/dL — ABNORMAL LOW (ref 8.9–10.3)
Chloride: 97 mmol/L — ABNORMAL LOW (ref 98–111)
Creatinine, Ser: 1.69 mg/dL — ABNORMAL HIGH (ref 0.61–1.24)
GFR, Estimated: 45 mL/min — ABNORMAL LOW (ref >60)
Glucose, Bld: 155 mg/dL — ABNORMAL HIGH (ref 70–99)
Potassium: 3.9 mmol/L (ref 3.5–5.1)
Sodium: 131 mmol/L — ABNORMAL LOW (ref 135–145)

## 2023-02-02 LAB — PROTIME-INR
INR: 1.2 (ref 0.8–1.2)
Prothrombin Time: 15 seconds (ref 11.4–15.2)

## 2023-02-02 LAB — LACTATE DEHYDROGENASE: LDH: 130 U/L (ref 98–192)

## 2023-02-02 LAB — HEPARIN LEVEL (UNFRACTIONATED): Heparin Unfractionated: <0.1 IU/mL — ABNORMAL LOW (ref 0.30–0.70)

## 2023-02-02 MED ORDER — IPRATROPIUM BROMIDE 0.02 % IN SOLN
0.5000 mg | RESPIRATORY_TRACT | Status: DC | PRN
  Administered 2023-02-07: 0.5 mg via RESPIRATORY_TRACT
  Filled 2023-02-02: qty 2.5

## 2023-02-02 MED ORDER — SODIUM CHLORIDE 0.9 % IV SOLN
25.0000 mg | Freq: Once | INTRAVENOUS | Status: AC
  Administered 2023-02-02: 25 mg via INTRAVENOUS
  Filled 2023-02-02: qty 1

## 2023-02-02 MED ORDER — WARFARIN SODIUM 7.5 MG PO TABS
7.5000 mg | ORAL_TABLET | Freq: Once | ORAL | Status: AC
  Administered 2023-02-02: 7.5 mg via ORAL
  Filled 2023-02-02: qty 1

## 2023-02-02 NOTE — Progress Notes (Signed)
Patient feeling sick and vomiting despite Zofran and Phenergan. Pt only wanting to rest. Will reschedule meds due.

## 2023-02-02 NOTE — Progress Notes (Signed)
Patient ID: Kevin Millers., male   DOB: 1961-09-29, 62 y.o.   MRN: [id number]   Advanced Heart Failure VAD Team Note  PCP-Cardiologist: Rozann Lesches, MD  AHF: Dr. 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 On IV vancomycin.  OR for debridement w/ wound vac placement 2/20. Plan to return to OR for washout 2/28.  Now back on warfarin + heparin, INR remains low at 1.2.  MAP low overnight but 81 this morning.  Creatinine up to 1.69.   Says breathing is better, still with some wheezing.  Sleeping poorly and tired.  Refusing PT/mobility because he "can't get any rest."    LVAD INTERROGATION:  HeartMate III LVAD:   Flow 4.2 liters/min, speed 5500, power 4, PI 4.2.  Many PI events, unchanged from past.    Objective:    Vital Signs:   Temp:  [97.7 F (36.5 C)-98.5 F (36.9 C)] 97.7 F (36.5 C) (02/25 0728) Pulse Rate:  [78-99] 99 (02/25 0728) Resp:  [17-21] 19 (02/25 0728) BP: (59-91)/(49-76) 91/73 (02/25 0728) SpO2:  [92 %-99 %] 95 % (02/25 0800) Weight:  [86.5 kg] 86.5 kg (02/25 0745) Last BM Date : 01/31/23 (per patient) Mean arterial Pressure 81 currently but low overnight  Intake/Output:   Intake/Output Summary (Last 24 hours) at 02/02/2023 0852 Last data filed at 02/02/2023 0743 Gross per 24 hour  Intake 719.05 ml  Output 1350 ml  Net -630.95 ml    Physical Exam    General: Well appearing this am. NAD.  HEENT: Normal. Neck: Supple, JVP 7-8 cm. Carotids OK.  Cardiac:  Mechanical heart sounds with LVAD hum present.  Lungs:  Occasional wheezes.  Abdomen:  NT, ND, no HSM. No bruits or masses. +BS  LVAD exit site: Wound vac present Extremities:  Warm and dry. No cyanosis, clubbing, rash, or edema.  Neuro:  Alert & oriented x 3. Cranial nerves grossly intact. Moves all 4 extremities w/o difficulty. Affect pleasant     Telemetry   NSR 90s, PVCs (Personally reviewed)    EKG   No new EKG  Labs   Basic Metabolic Panel: Recent Labs   Lab 01/28/23 0033 01/29/23 0017 01/30/23 0537 01/30/23 2242 01/31/23 0030 02/01/23 0032 02/02/23 0026  NA 135   < > 133* 132* 133* 132* 131*  K 3.9   < > 3.9 3.9 4.1 3.7 3.9  CL 102   < > 98 93* 93* 95* 97*  CO2 21*   < > '24 25 27 26 25  '$ GLUCOSE 175*   < > 157* 145* 161* 156* 155*  BUN 27*   < > '12 15 14 '$ 26* 35*  CREATININE 1.37*   < > 1.04 1.13 1.21 1.31* 1.69*  CALCIUM 8.5*   < > 8.8* 9.0 9.1 8.5* 8.1*  MG 1.9  --   --  1.9  --   --   --    < > = values in this interval not displayed.   Liver Function Tests: Recent Labs  Lab 01/27/23 0846  AST 18  ALT 17  ALKPHOS 101  BILITOT 0.5  PROT 6.5  ALBUMIN 3.3*   CBC: Recent Labs  Lab 01/29/23 0017 01/30/23 0537 01/31/23 0030 02/01/23 0032 02/02/23 0026  WBC 6.4 5.9 4.4 6.1 5.0  HGB 11.0* 11.3* 12.0* 11.4* 11.1*  HCT 33.1* 33.4* 36.5* 33.2* 31.7*  MCV 86.4 84.8 85.7 84.5 82.8  PLT 186 190 192 197 184   INR:  Recent Labs  Lab 01/29/23 0017 01/30/23 0537 01/31/23 0030 02/01/23 0032 02/02/23 0026  INR 1.1 1.1 1.1 1.2 1.2    Other results: EKG:   Imaging   No results found.  Medications:     Scheduled Medications:  allopurinol  100 mg Oral Daily   amiodarone  200 mg Oral BID   aspirin EC  325 mg Oral Daily   feeding supplement (GLUCERNA SHAKE)  237 mL Oral TID WC   glipiZIDE  5 mg Oral Q breakfast   guaiFENesin  600 mg Oral BID   insulin aspart  0-24 Units Subcutaneous TID WC   insulin aspart  0-5 Units Subcutaneous QHS   ipratropium  0.5 mg Nebulization QID   loratadine  10 mg Oral Daily   metFORMIN  500 mg Oral BID WC   metoprolol succinate  25 mg Oral Daily   minocycline  200 mg Oral BID   multivitamin with minerals  1 tablet Oral Q24H   pantoprazole  40 mg Oral Daily   rosuvastatin  20 mg Oral Daily   sertraline  50 mg Oral Daily   tamsulosin  0.4 mg Oral Daily   thiamine (VITAMIN B1) injection  100 mg Intravenous Daily   traZODone  150 mg Oral QHS   Warfarin - Pharmacist Dosing  Inpatient   Does not apply q1600   zinc sulfate  220 mg Oral Daily    Infusions:  heparin 500 Units/hr (02/02/23 0430)   vancomycin 1,000 mg (02/01/23 2210)    PRN Medications: acetaminophen, colchicine, fluticasone, levalbuterol, ondansetron (ZOFRAN) IV, oxyCODONE   Patient Profile  62 y.o. 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 type 2 diabetes mellitus, COPD, hypertension, and HMIII VAD.    Admitted with recurrent LVAD complication driveline infection + suspected AECOPD   Assessment/Plan:    1. LVAD Complication---> Recurrent driveline infection: MRSA driveline infection, admitted in 6/23 and again in 7/23-8/23 with multiple trips to the OR for debridement. He had completed vancomycin and then long-term doxycycline. Most recent culture PTA grew acinetobacter and MRSA. ID started minocycline. He had been off minocycline for at least 3 days prior to this admit w/ copious drainage around driveline. CT chest/abd/pelvis showed stranding around the driveline and small amount of fluid. Started on Vanc and Meropenum per ID. Now off meropenem (2/21).  OR for debridement w/ wound vac placement 2/20. Wound culture with MRSA, blood cultures negative.  Afebrile.  - Will need 6 wks vancomycin then back to minocycline alone.   - Plan to return to OR for washout/vac change 2/28. Keep INR 1.8-2.2 until then.  2. Chronic HFrEF: Echo 10/21 with EF 20-25%, mildly decreased RV function. LHC/RHC in 12/21 with patent grafts, low output. Suspect mixed ischemic/nonischemic cardiomyopathy (prior heavy ETOH and drugs as well as CAD).  No ETOH, drugs, smoking since CABG in 12/20. Admitted with cardiogenic shock in 12/21, had placement of Impella 5.5 initially, now s/p Heartmate 3 LVAD on 11/17/20.  Ramp echo 2/23 with speed decreased to 5500 rpm.  He  does not look volume overloaded, cough and wheezing likely related to COPD. LDH 130.  MAP low overnight now 81.  MAP has been up and down this admission, has been hard to manage.  Creatinine higher at 1.69 today.  - No Lasix.  - Stop Entresto.  - Warfarin restarted, goal INR 1.8-2.2  until surgery. Covering w/ heparin gtt - Not a candidate for transplant with ongoing drug use.  3. HTN: MAPs elevated low 100s on admit, poor compliance with meds. Improved w/ restart of home meds but hypotensive overnight.  Has fluctuated significantly. MAP 81 by doppler this morning.  - Can continue Toprol XL 25 mg daily  - Stop Entresto 24/26 bid.  4. Polysubstance Abuse: Recently started using methamphetamine again.  UDS + for amphetamines.  - SW team following. Unfortunately, limited resources for inpatient substance use due LVAD 5. PAF: S/p Maze and LA appendage clip with CABG in 12/20.  NSR this admission.  6. 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  Still some wheezing but has improved.   - Continue scheduled atrovent.  - Levalbuterol prn.  - Have avoided prednisone with surgery at driveline site for recurrent infection.   7. DMII - SSI  8.  NSVT/PVCs: Frequent NSVT 2/23, may have been dry after diuresis.  Amiodarone gtt started to suppress.  - Now on po amiodarone 200 mg bid.  - Would eventually stop this medication, avoid with COPD.    Patient refusing to work with PT, not motivated and expresses frustration with his overall situation.  Ongoing drug use, no longer living with his wife.  Has been living with his mother who cannot care for him.  He will need SNF for IV antibiotics at discharge.   I reviewed the LVAD parameters from today, and compared the results to the patient's prior recorded data.  No programming changes were made.  The LVAD is functioning within specified parameters.  The patient performs LVAD self-test daily.  LVAD interrogation was negative for any significant power changes, alarms or PI  events/speed drops.  LVAD equipment check completed and is in good working order.  Back-up equipment present.   LVAD education done on emergency procedures and precautions and reviewed exit site care.  Length of Stay: 6  Loralie Champagne, MD 02/02/2023, 8:52 AM  VAD Team --- VAD ISSUES ONLY--- Pager [phone] (7am - 7am)  Advanced Heart Failure Team  Pager [phone] (M-F; 7a - 5p)  Please contact [facility] for night-coverage after hours (5p -7a ) and weekends on [email or web address]

## 2023-02-02 NOTE — Progress Notes (Signed)
ANTICOAGULATION CONSULT NOTE  Pharmacy Consult for Heparin > warfarin  Indication: LVAD  No Known Allergies  Patient Measurements: Height: '5\' 10"'$  (177.8 cm) Weight: 86.5 kg (190 lb 11.2 oz) IBW/kg (Calculated) : 73   Vital Signs: Temp: 97.6 F (36.4 C) (02/25 1059) Temp Source: Oral (02/25 1059) BP: 96/45 (02/25 1059) Pulse Rate: 99 (02/25 1059)  Labs: Recent Labs    01/31/23 0030 02/01/23 0032 02/02/23 0026  HGB 12.0* 11.4* 11.1*  HCT 36.5* 33.2* 31.7*  PLT 192 197 184  LABPROT 13.6 14.9 15.0  INR 1.1 1.2 1.2  HEPARINUNFRC <0.10* <0.10* <0.10*  CREATININE 1.21 1.31* 1.69*     Estimated Creatinine Clearance: 46.8 mL/min (A) (by C-G formula based on SCr of 1.69 mg/dL (H)).   Medical History: Past Medical History:  Diagnosis Date   "    Arthritis    CAD (coronary artery disease)    a. s/p CABG in 11/2019 with LIMA-LAD, SVG-OM1, SVG-PDA and SVG-D1   CHF (congestive heart failure) (HCC)    a. EF < 20% by echo in 11/2019   Essential hypertension    PAF (paroxysmal atrial fibrillation) (HCC)    Type 2 diabetes mellitus (HCC)     Medications:   heparin 500 Units/hr (02/02/23 0430)   vancomycin 1,000 mg (02/02/23 1302)    Assessment: 62 yo male on chronic Coumadin for LVAD, poor compliance with outpatient checks.  INR on admission 1.1.  Previous Coumadin dose 6 mg daily except 4 mg on Tues, Thurs.  S/p OR 2/20 for drive line infection debridement.  Restart fixed low dose heparin gtt at 500 units/hr no up titration heparin level < 0.1 Restart warfarin 2/20 with goal INR about 1.8-2.2 with planned debridement next week.    CBC stable, no overt bleeding or complications noted.  Planning to continue aspirin 325 mg until INR closer to 2 then stop No long term as planned   INR 1.2 slight bump after warfarin restarted- boost today Cbc stable  PTA warfarin '4mg'$  TT / '6mg'$  AOD Goal of Therapy:  INR 2-2.5 Monitor platelets by anticoagulation protocol: Yes   Plan:   Continue IV heparin at 500 units/hr  Warfarin 7.'5mg'$  po x 1 today. Daily cbc, heparin level and INR     Bonnita Nasuti Pharm.D. CPP, BCPS Clinical Pharmacist [phone] 02/02/2023 2:10 PM   [facility] pharmacy phone numbers are listed on [email or web address]

## 2023-02-03 DIAGNOSIS — T827XXD Infection and inflammatory reaction due to other cardiac and vascular devices, implants and grafts, subsequent encounter: Secondary | ICD-10-CM | POA: Diagnosis not present

## 2023-02-03 DIAGNOSIS — T829XXD Unspecified complication of cardiac and vascular prosthetic device, implant and graft, subsequent encounter: Secondary | ICD-10-CM | POA: Diagnosis not present

## 2023-02-03 LAB — PROTIME-INR
INR: 1.4 — ABNORMAL HIGH (ref 0.8–1.2)
Prothrombin Time: 16.8 seconds — ABNORMAL HIGH (ref 11.4–15.2)

## 2023-02-03 LAB — CBC
HCT: 33.1 % — ABNORMAL LOW (ref 39.0–52.0)
HCT: 35.3 % — ABNORMAL LOW (ref 39.0–52.0)
Hemoglobin: 11.2 g/dL — ABNORMAL LOW (ref 13.0–17.0)
Hemoglobin: 11.8 g/dL — ABNORMAL LOW (ref 13.0–17.0)
MCH: 28 pg (ref 26.0–34.0)
MCH: 28.3 pg (ref 26.0–34.0)
MCHC: 33.4 g/dL (ref 30.0–36.0)
MCHC: 33.8 g/dL (ref 30.0–36.0)
MCV: 82.8 fL (ref 80.0–100.0)
MCV: 84.7 fL (ref 80.0–100.0)
Platelets: 156 10*3/uL (ref 150–400)
Platelets: 192 10*3/uL (ref 150–400)
RBC: 4 MIL/uL — ABNORMAL LOW (ref 4.22–5.81)
RBC: 4.17 MIL/uL — ABNORMAL LOW (ref 4.22–5.81)
RDW: 13.6 % (ref 11.5–15.5)
RDW: 13.7 % (ref 11.5–15.5)
WBC: 5.7 10*3/uL (ref 4.0–10.5)
WBC: 5.7 10*3/uL (ref 4.0–10.5)
nRBC: 0 % (ref 0.0–0.2)
nRBC: 0 % (ref 0.0–0.2)

## 2023-02-03 LAB — GLUCOSE, CAPILLARY
Glucose-Capillary: 127 mg/dL — ABNORMAL HIGH (ref 70–99)
Glucose-Capillary: 95 mg/dL (ref 70–99)
Glucose-Capillary: 154 mg/dL — ABNORMAL HIGH (ref 70–99)
Glucose-Capillary: 82 mg/dL (ref 70–99)

## 2023-02-03 LAB — BASIC METABOLIC PANEL
Anion gap: 12 (ref 5–15)
BUN: 25 mg/dL — ABNORMAL HIGH (ref 8–23)
CO2: 24 mmol/L (ref 22–32)
Calcium: 8.4 mg/dL — ABNORMAL LOW (ref 8.9–10.3)
Chloride: 97 mmol/L — ABNORMAL LOW (ref 98–111)
Creatinine, Ser: 1.08 mg/dL (ref 0.61–1.24)
GFR, Estimated: >60 mL/min (ref >60)
Glucose, Bld: 141 mg/dL — ABNORMAL HIGH (ref 70–99)
Potassium: 3.8 mmol/L (ref 3.5–5.1)
Sodium: 133 mmol/L — ABNORMAL LOW (ref 135–145)

## 2023-02-03 LAB — HEPARIN LEVEL (UNFRACTIONATED): Heparin Unfractionated: <0.1 IU/mL — ABNORMAL LOW (ref 0.30–0.70)

## 2023-02-03 LAB — LACTATE DEHYDROGENASE: LDH: 161 U/L (ref 98–192)

## 2023-02-03 MED ORDER — WARFARIN SODIUM 3 MG PO TABS
6.0000 mg | ORAL_TABLET | Freq: Once | ORAL | Status: AC
  Administered 2023-02-03: 6 mg via ORAL
  Filled 2023-02-03: qty 2

## 2023-02-03 MED ORDER — SODIUM CHLORIDE 0.9 % IV BOLUS
250.0000 mL | Freq: Once | INTRAVENOUS | Status: AC
  Administered 2023-02-03: 250 mL via INTRAVENOUS

## 2023-02-03 NOTE — TOC Progression Note (Addendum)
Transition of Care (TOC) - Progression Note    Patient Details  Name: Kevin Underwood. MRN: [id number] Date of Birth: 08-Aug-1961  Transition of Care [facility]) CM/SW Contact  Erenest Rasher, RN Phone Number: [phone]  02/03/2023, 11:36 AM  Clinical Narrative:     Letter for court was completed by provider, faxed to Olivia Lopez de Gutierrez, # [phone]. Spoke to pt's mother, states his attorney is Dellis Filbert Hux, # [phone], contacted office and confirmed. The fax number to send letter, # [phone]  [facility] declined SNF referral.   Contacted [facility] Liaison, Narda Rutherford, states she will check with management on SNF rehab admission and give call back. [facility] and they do not have staff to support LVAD patient. Faxed referral to other SNF facilities, will extend out search if no response.  Received call from pt's mother stating pt has a court date on 02/05/2023. She is requesting a letter to [place] to extend out his date due to hospitalization. Fax # [phone], states letter will need to have a projected date when he can attend court (unknown due to medical condition). Message sent to attending.   Faxed referral for [employer] wound vac to rep, [name], made aware pt will return to surgery on 2.28 and going to SNF.   Expected Discharge Plan: [place] Barriers to Discharge: Continued Medical Work up  Expected Discharge Plan and Services   Discharge Planning Services: CM Consult Post Acute Care Choice: [place] arrangements for the past 2 months: [place]: RN           Social Determinants of Health (SDOH) Interventions [place]: No Food Insecurity (09/10/2021)  Housing: Low Risk  (09/10/2021)  Transportation Needs: Unmet Transportation Needs (09/10/2021)  Alcohol Screen: Low Risk  (11/10/2020)   Depression (PHQ2-9): Low Risk  (10/16/2022)  Financial Resource Strain: Medium Risk (05/17/2022)  Physical Activity: Inactive (11/10/2020)  Tobacco Use: Medium Risk (01/29/2023)    Readmission Risk Interventions    07/01/2022    9:05 AM 11/28/2020   11:57 AM  Readmission Risk Prevention Plan  Transportation Screening Complete Complete  Medication Review Press photographer) Complete Complete  PCP or Specialist appointment within 3-5 days of discharge Complete   HRI or [place] Complete Complete  SW Recovery Care/Counseling Consult Complete Complete  [place] Not Applicable Not [place] Not Applicable Not Applicable

## 2023-02-03 NOTE — Progress Notes (Signed)
LVAD Coordinator Rounding Note:  Admitted 01/27/23 to Heart Failure service from ED due to driveline infection. Started on IV antibiotics.   S/p driveline debridement in OR 2/21. Wound vac dressing clean, dry, intact. Scant serosanguinous drainage in canister. Plan to return to OR for washout 02/05/23 per Dr Prescott Gum.    Pt laying in bed this morning. States he is feeling better today- reports nausea/vomiting yesterday. He is hopeful to feel well enough to walk in the hallway.  Pt will need wound vac and home IV antibiotics at discharge. ID is consulted. Plan to continue Vancomycin and Meropenem per Dr Linus Salmons note 2/20.   Pt will need placement in SNF for IV antibiotics as he is living with his mom and she is unable to help him. VAD coordinator spoke with his wife last week and they are not currently getting along. It would be much safer for him to be in a SNF post discharge for IV antibiotics. Inpatient social work awaiting bed offers for placement.   Wound VAC with air leak/blockage and suction ramping down to -25 (set at -100) this morning. Attempted to reinforce Ioban dressing with no resolution of alarming. Changed outer dressing at bedside see below for full note.  Controller electively exchanged this admission. No further low voltage hazard alarms seen on interrogation.  Vital signs: Temp:  97.4 HR: 89 Doppler Pressure: 82 Automatic BP: 103/74 (84) O2 Sat: 100% on RA Wt: 192.9>197>195.9>190.9>187.6 lbs   LVAD interrogation reveals:  Speed: 5600 Flow: 4.3 Power: 4.1 w PI: 4.5 Alarms: none Events: 100+ today Hct: 33  Fixed speed:  5600 Low speed limit:  5300  Drive Line:  Wound vac dressing clean, dry, intact. Blockage/leak alarms occurring. Suction tubing is clear. Decision was made to change outer vac dressing to resolve alarms. Dressing remove using sterile technique. Small black sponge in wound bed intact. Cleansed area around sponge with CHG swab x 2 and allowed to dry.  VAC Ioban dressing reapplied with new suction pad/tubing. Anchor in place x 2. Suction -100. Suctioning well, no further alarms. Small amount of serosanguinous drainage noted in canister. Plan for washout and wound vac change in OR 02/05/23 per Dr Prescott Gum.   Labs:  LDH trend: 203>187>142>145>139>161  INR trend: 1.1>1.1>1.4  Anticoagulation Plan: -INR Goal: 2.0 - 2.5 -ASA Dose: none -Heparin 500 u/hr   ICD: N/A  Infection:  01/27/23>> blood cultures>> no growth 4 days 01/27/23>>driveline culture>>FEW METHICILLIN RESISTANT STAPHYLOCOCCUS AUREUS  01/28/23>>driveline cx from OR>>FEW METHICILLIN RESISTANT STAPHYLOCOCCUS AUREUS; final  Plan/Recommendations:  Page VAD coordinator with equipment issues or driveline problems Plan for to return to OR Wednesday for washout with Dr Lucianne Lei Simon Rhein RN [place] Coordinator  Office: [phone]  24/7 Pager: [phone]

## 2023-02-03 NOTE — Progress Notes (Signed)
   02/03/23 1914  Assess: MEWS Score  Temp 97.8 F (36.6 C)  BP (!) 78/62  MAP (mmHg) 68  Pulse Rate 92  ECG Heart Rate 92  Resp 16  Level of Consciousness Alert  SpO2 97 %  O2 Device Room Air  Assess: MEWS Score  MEWS Temp 0  MEWS Systolic 2  MEWS Pulse 0  MEWS RR 0  MEWS LOC 0  MEWS Score 2  MEWS Score Color Yellow  Assess: if the MEWS score is Yellow or Red  Were vital signs taken at a resting state? Yes  Focused Assessment Change from prior assessment (see assessment flowsheet)  Does the patient meet 2 or more of the SIRS criteria? No  Does the patient have a confirmed or suspected source of infection? No  Provider and Rapid Response Notified? No  MEWS guidelines implemented  No, previously yellow, continue vital signs every 4 hours  Notify: Charge Nurse/RN  Name of Charge Nurse/RN Notified [place]  Provider Notification  Provider Name/Title Bryson Ha  Date Provider Notified 02/03/23  Time Provider Notified 1915  Method of Notification Call  Notification Reason Change in status  Provider response See new orders  Date of Provider Response 02/03/23  Time of Provider Response 1915  Assess: SIRS CRITERIA  SIRS Temperature  0  SIRS Pulse 1  SIRS Respirations  0  SIRS WBC 0  SIRS Score Sum  1

## 2023-02-03 NOTE — Progress Notes (Addendum)
Patient ID: Kevin Millers., male   DOB: 01-29-1961, 62 y.o.   MRN: [id number]   Advanced Heart Failure VAD Team Note  PCP-Cardiologist: Rozann Lesches, MD  AHF: Dr. 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 On IV vancomycin.  OR for debridement w/ wound vac placement 2/20. Plan to return to OR for washout 2/28.  Now back on warfarin + heparin, INR remains low at 1.4   Feeling better today.    LVAD INTERROGATION:  HeartMate III LVAD:   Flow 4.5 liters/min, speed 5600, power 4, PI 2.8 .  ~ 100 PI events.  Objective:    Vital Signs:   Temp:  [97.6 F (36.4 C)-98 F (36.7 C)] 97.9 F (36.6 C) (02/26 0414) Pulse Rate:  [88-105] 88 (02/26 0455) Resp:  [14-19] 15 (02/26 0455) BP: (84-157)/(45-107) 99/82 (02/26 0416) SpO2:  [95 %-100 %] 100 % (02/26 0455) Weight:  [85.1 kg-86.5 kg] 85.1 kg (02/26 0455) Last BM Date : 01/31/23 Mean arterial Pressure 80s   Intake/Output:   Intake/Output Summary (Last 24 hours) at 02/03/2023 0711 Last data filed at 02/03/2023 0416 Gross per 24 hour  Intake 168.13 ml  Output 1020 ml  Net -851.87 ml    Physical Exam   Physical Exam: GENERAL: No acute distress. HEENT: normal  NECK: Supple, JVP flat  .  2+ bilaterally, no bruits.  No lymphadenopathy or thyromegaly appreciated.   CARDIAC:  Mechanical heart sounds with LVAD hum present.  LUNGS:  Clear to auscultation bilaterally.  ABDOMEN:  Soft, round, nontender, positive bowel sounds x4.     LVAD exit site: [facility]  Dressing leaking.  Driveline dressing is being changed daily per sterile technique. EXTREMITIES:  Warm and dry, no cyanosis, clubbing, rash or edema  NEUROLOGIC:  Alert and oriented x 3.    No aphasia.  No dysarthria.  Affect pleasant.        Telemetry   SR 80-90s with occasional PVCs.  EKG   No new EKG  Labs   Basic Metabolic Panel: Recent Labs  Lab 01/28/23 0033 01/29/23 0017 01/30/23 0537 01/30/23 2242 01/31/23 0030 02/01/23 0032  02/02/23 0026  NA 135   < > 133* 132* 133* 132* 131*  K 3.9   < > 3.9 3.9 4.1 3.7 3.9  CL 102   < > 98 93* 93* 95* 97*  CO2 21*   < > '24 25 27 26 25  '$ GLUCOSE 175*   < > 157* 145* 161* 156* 155*  BUN 27*   < > '12 15 14 '$ 26* 35*  CREATININE 1.37*   < > 1.04 1.13 1.21 1.31* 1.69*  CALCIUM 8.5*   < > 8.8* 9.0 9.1 8.5* 8.1*  MG 1.9  --   --  1.9  --   --   --    < > = values in this interval not displayed.   Liver Function Tests: Recent Labs  Lab 01/27/23 0846  AST 18  ALT 17  ALKPHOS 101  BILITOT 0.5  PROT 6.5  ALBUMIN 3.3*   CBC: Recent Labs  Lab 01/30/23 0537 01/31/23 0030 02/01/23 0032 02/02/23 0026 02/03/23 0021  WBC 5.9 4.4 6.1 5.0 5.7  HGB 11.3* 12.0* 11.4* 11.1* 11.2*  HCT 33.4* 36.5* 33.2* 31.7* 33.1*  MCV 84.8 85.7 84.5 82.8 82.8  PLT 190 192 197 184 192   INR: Recent Labs  Lab 01/30/23 0537 01/31/23 0030 02/01/23 0032 02/02/23 0026 02/03/23 0021  INR 1.1 1.1 1.2 1.2 1.4*    Other results: EKG:   Imaging   No results found.  Medications:     Scheduled Medications:  allopurinol  100 mg Oral Daily   amiodarone  200 mg Oral BID   aspirin EC  325 mg Oral Daily   feeding supplement (GLUCERNA SHAKE)  237 mL Oral TID WC   glipiZIDE  5 mg Oral Q breakfast   guaiFENesin  600 mg Oral BID   insulin aspart  0-24 Units Subcutaneous TID WC   insulin aspart  0-5 Units Subcutaneous QHS   loratadine  10 mg Oral Daily   metFORMIN  500 mg Oral BID WC   metoprolol succinate  25 mg Oral Daily   minocycline  200 mg Oral BID   multivitamin with minerals  1 tablet Oral Q24H   pantoprazole  40 mg Oral Daily   rosuvastatin  20 mg Oral Daily   sertraline  50 mg Oral Daily   tamsulosin  0.4 mg Oral Daily   thiamine (VITAMIN B1) injection  100 mg Intravenous Daily   traZODone  150 mg Oral QHS   Warfarin - Pharmacist Dosing Inpatient   Does not apply q1600   zinc sulfate  220 mg Oral Daily    Infusions:  heparin 500 Units/hr (02/03/23 0416)   vancomycin  1,000 mg (02/02/23 2310)    PRN Medications: acetaminophen, colchicine, fluticasone, ipratropium, levalbuterol, ondansetron (ZOFRAN) IV, oxyCODONE   Patient Profile  62 y.o. 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 type 2 diabetes mellitus, COPD, hypertension, and HMIII VAD.    Admitted with recurrent LVAD complication driveline infection + suspected AECOPD   Assessment/Plan:    1. LVAD Complication---> Recurrent driveline infection: MRSA driveline infection, admitted in 6/23 and again in 7/23-8/23 with multiple trips to the OR for debridement. He had completed vancomycin and then long-term doxycycline. Most recent culture PTA grew acinetobacter and MRSA. ID started minocycline. He had been off minocycline for at least 3 days prior to this admit w/ copious drainage around driveline. CT chest/abd/pelvis showed stranding around the driveline and small amount of fluid. Started on Vanc and Meropenum per ID. Now off meropenem (2/21).  OR for debridement w/ wound vac placement 2/20. Wound culture with MRSA, blood cultures negative.  Afebrile.  - Will need 6 wks vancomycin then back to minocycline alone.   - Plan to return to OR for washout/vac change 2/28. Keep INR 1.8-2.2 until then.  2. Chronic HFrEF: Echo 10/21 with EF 20-25%, mildly decreased RV function. LHC/RHC in 12/21 with patent grafts, low output. Suspect mixed ischemic/nonischemic cardiomyopathy (prior heavy ETOH and drugs as well as CAD).  No ETOH, drugs, smoking since CABG in 12/20. Admitted with cardiogenic shock in 12/21, had placement of Impella 5.5 initially, now s/p Heartmate 3 LVAD on 11/17/20.  Ramp echo 2/23 with speed decreased to 5500 rpm. Volume status stable. Now off entresto due to lower Maps and elevated creatinine.  Does not need lasix.  - LDH 160 today.  -  Warfarin restarted, goal INR 1.8-2.2 until surgery. Covering w/ heparin gtt. INR 1.4.  - Not a candidate for transplant with ongoing drug use.  3. HTN: MAPs elevated low 100s on admit, poor compliance with meds. Improved w/ restart of home meds but hypotensive overnight.  Has fluctuated significantly. MAP 80s.  -  Can continue Toprol XL 25 mg daily  - Entresto stopped yesterday due to lower Maps.   4. Polysubstance Abuse: Recently started using methamphetamine again.  UDS + for amphetamines.  - SW team following. Unfortunately, limited resources for inpatient substance use due LVAD 5. PAF: S/p Maze and LA appendage clip with CABG in 12/20.  NSR this admission.  6. 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  Still some wheezing but has improved.   - Continue scheduled atrovent.  - Levalbuterol prn.  - Have avoided prednisone with surgery at driveline site for recurrent infection.   7. DMII - SSI  8.  NSVT/PVCs: Frequent NSVT 2/23, may have been dry after diuresis.  Amiodarone gtt started to suppress.  - Continue po amiodarone 200 mg bid.  - Would eventually stop this medication, avoid with COPD.    Patient refusing to work with PT, not motivated and expresses frustration with his overall situation.  Ongoing drug use, no longer living with his wife.  Has been living with his mother who cannot care for him.  He will need SNF for IV antibiotics at discharge.  TOC Team working to find SNF.    I reviewed the LVAD parameters from today, and compared the results to the patient's prior recorded data.  No programming changes were made.  The LVAD is functioning within specified parameters.  The patient performs LVAD self-test daily.  LVAD interrogation was negative for any significant power changes, alarms or PI events/speed drops.  LVAD equipment check completed and is in good working order.  Back-up equipment present.   LVAD education done on emergency procedures and precautions and reviewed exit site care.  Length of  Stay: 7  Kevin Grinder, NP 02/03/2023, 7:11 AM  VAD Team --- VAD ISSUES ONLY--- Pager [phone] (7am - 7am)  Advanced Heart Failure Team  Pager [phone] (M-F; 7a - 5p)  Please contact [facility] for night-coverage after hours (5p -7a ) and weekends on [email or web address]  Patient seen and examined with the above-signed Advanced Practice Provider and/or Housestaff. I personally reviewed laboratory data, imaging studies and relevant notes. I independently examined the patient and formulated the important aspects of the plan. I have edited the note to reflect any of my changes or salient points. I have personally discussed the plan with the patient and/or family.  Remains on IV abx. Also on low-dose heparin. Denies fevers, chills or bleeding. Says he didn't sleep well last night.   Wound vac clogged this am but was successfully unclogged,  Denies CP or SOB  General:  NAD.  HEENT: normal  Neck: supple. JVP not elevated.  Carotids 2+ bilat; no bruits. No lymphadenopathy or thryomegaly appreciated. Cor: LVAD hum.  Lungs: Clear. Abdomen: obese soft, nontender, non-distended. No hepatosplenomegaly. No bruits or masses. Good bowel sounds. Driveline site  with wound vac No erythema . Anchor in place.  Extremities: no cyanosis, clubbing, rash. Warm no edema  Neuro: alert & oriented x 3. No focal deficits. Moves all 4 without problem   DL site looks ok. Continue IV abx. Back to OR on Wed. Wound vac now working well.   Continue heparin/warfarin. Discussed dosing with PharmD personally.  VAD interrogated personally. Parameters stable.  Glori Bickers, MD  2:38 PM

## 2023-02-03 NOTE — Progress Notes (Signed)
CSW visited bedside to discuss options for substance use treatment programs for post discharge. Patient sleeping therefore CSW will attempt again tomorrow. Raquel Sarna, [place], [place]

## 2023-02-03 NOTE — Progress Notes (Signed)
Mobility Specialist Progress Note    02/03/23 1650  Mobility  Activity Ambulated with assistance in room  Level of Assistance Contact guard assist, steadying assist  Assistive Device Other (Comment) (HHA)  Distance Ambulated (ft) 5 ft  Activity Response Tolerated well  Mobility Referral Yes  $Mobility charge 1 Mobility   Pre-Mobility: 87 HR, 89% SpO2 During Mobility: 94 HR Post-Mobility: 97 HR  Pt received in bed and declining further ambulation d/t not having shoes and fatigue. Agreeable to get to chair for dinner. Left with call bell in reach.   Hildred Alamin Mobility Specialist  Please Psychologist, sport and exercise or Rehab Office at [phone]

## 2023-02-03 NOTE — Progress Notes (Signed)
1914 - pt c/o feeling dizzy, lightheaded, and blurred vision.  VS taken as follows, Pulse 92 SR, bp 78/62 (68) left arm, with doppler of 64.  Right arm 78/67 (72).  LVAD numbers 4.8 flow, 4 watts, 2.9 PI.  Called [facility] LVAD coordinator.  Reported to her 128 PI events since 1700 per LVAD monitor.  Orders received and carried out.

## 2023-02-03 NOTE — Progress Notes (Signed)
ANTICOAGULATION CONSULT NOTE  Pharmacy Consult for Heparin > warfarin  Indication: LVAD  No Known Allergies  Patient Measurements: Height: '5\' 10"'$  (177.8 cm) Weight: 85.1 kg (187 lb 9.8 oz) IBW/kg (Calculated) : 73   Vital Signs: Temp: 97.4 F (36.3 C) (02/26 0751) Temp Source: Oral (02/26 0751) BP: 103/74 (02/26 0751) Pulse Rate: 94 (02/26 0751)  Labs: Recent Labs    02/01/23 0032 02/02/23 0026 02/03/23 0021 02/03/23 0600  HGB 11.4* 11.1* 11.2*  --   HCT 33.2* 31.7* 33.1*  --   PLT 197 184 192  --   LABPROT 14.9 15.0 16.8*  --   INR 1.2 1.2 1.4*  --   HEPARINUNFRC <0.10* <0.10* <0.10*  --   CREATININE 1.31* 1.69*  --  1.08     Estimated Creatinine Clearance: 73.2 mL/min (by C-G formula based on SCr of 1.08 mg/dL).   Medical History: Past Medical History:  Diagnosis Date   "    Arthritis    CAD (coronary artery disease)    a. s/p CABG in 11/2019 with LIMA-LAD, SVG-OM1, SVG-PDA and SVG-D1   CHF (congestive heart failure) (HCC)    a. EF < 20% by echo in 11/2019   Essential hypertension    PAF (paroxysmal atrial fibrillation) (HCC)    Type 2 diabetes mellitus (HCC)     Medications:   heparin 500 Units/hr (02/03/23 0701)   vancomycin 1,000 mg (02/02/23 2310)    Assessment: 62 yo male on chronic Coumadin for LVAD, poor compliance with outpatient checks.  INR on admission 1.1. S/p OR 2/20 for drive line infection debridement.   Previous Coumadin dose 6 mg daily except 4 mg on Tues, Thurs.  Heparin level came back undetectable on heparin at 500 units/hr (no titration). INR today came back at 1.4 with increasing warfarin dose. Hgb 11.2, plt 192. LDH 161. No s/sx of bleeding or infusion issues. Planning to continue aspirin 325 mg until INR closer to 2 then stop.  Goal of Therapy:  INR 2-2.5 >>  aim closer to 1.8-2.2  Monitor platelets by anticoagulation protocol: Yes   Plan:  Continue IV heparin at 500 units/hr (no titration) Warfarin 6 mg po x 1  today. Daily cbc, heparin level and INR  Antonietta Jewel, PharmD, BCCCP Clinical Pharmacist  Phone: [phone] 02/03/2023 10:16 AM  Please check AMION for all [place] phone numbers After 10:00 PM, call [place] [phone]

## 2023-02-03 NOTE — Progress Notes (Signed)
[facility] for Infectious Disease  Date of Admission:  01/27/2023   Total days of inpatient antibiotics 8  Principal Problem:   Left ventricular assist device (LVAD) complication, subsequent encounter          Assessment: 89 YM admitted with: #Driveline infection SP debridement with Cx+ MRSA #Vancomycin intolerance -Pt has nausea since yesterday morning that has not resolved with zofran.  He has retching while I was in the room. States he  had similar experience int he past while being on vanc(switched to dapto)  Recommendations: -D/C vanc -Start daptomycin, plan son 6 weeks IV then suppressive mino -washout/vac exchange on 2/28   Microbiology:   Antibiotics: Merrem 2/19-20 Vancomycin 2/19-  Cultures: Blood 2/19 ng    SUBJECTIVE: Reports ongoing nausea dn dry retching.  Interval:  afebrile, wbc 5.7k  Review of Systems: Review of Systems  All other systems reviewed and are negative.    Scheduled Meds:  allopurinol  100 mg Oral Daily   amiodarone  200 mg Oral BID   aspirin EC  325 mg Oral Daily   feeding supplement (GLUCERNA SHAKE)  237 mL Oral TID WC   glipiZIDE  5 mg Oral Q breakfast   guaiFENesin  600 mg Oral BID   insulin aspart  0-24 Units Subcutaneous TID WC   insulin aspart  0-5 Units Subcutaneous QHS   loratadine  10 mg Oral Daily   metFORMIN  500 mg Oral BID WC   metoprolol succinate  25 mg Oral Daily   minocycline  200 mg Oral BID   multivitamin with minerals  1 tablet Oral Q24H   pantoprazole  40 mg Oral Daily   rosuvastatin  20 mg Oral Daily   sertraline  50 mg Oral Daily   tamsulosin  0.4 mg Oral Daily   thiamine (VITAMIN B1) injection  100 mg Intravenous Daily   traZODone  150 mg Oral QHS   Warfarin - Pharmacist Dosing Inpatient   Does not apply q1600   zinc sulfate  220 mg Oral Daily   Continuous Infusions:  heparin 500 Units/hr (02/03/23 0701)   vancomycin 1,000 mg (02/02/23 2310)   PRN Meds:.acetaminophen,  colchicine, fluticasone, ipratropium, levalbuterol, ondansetron (ZOFRAN) IV, oxyCODONE No Known Allergies  OBJECTIVE: Vitals:   02/03/23 0414 02/03/23 0416 02/03/23 0455 02/03/23 0751  BP: (!) 84/71 99/82  103/74  Pulse: (!) 105  88 94  Resp: '15  15 18  '$ Temp: 97.9 F (36.6 C)   (!) 97.4 F (36.3 C)  TempSrc: Oral   Oral  SpO2: 100%  100% 100%  Weight:   85.1 kg   Height:       Body mass index is 26.92 kg/m.  Physical Exam Constitutional:      General: He is not in acute distress.    Appearance: He is normal weight. He is not toxic-appearing.  HENT:     Head: Normocephalic and atraumatic.     Right Ear: External ear normal.     Left Ear: External ear normal.     Nose: No congestion or rhinorrhea.     Mouth/Throat:     Mouth: Mucous membranes are moist.     Pharynx: Oropharynx is clear.  Eyes:     Extraocular Movements: Extraocular movements intact.     Conjunctiva/sclera: Conjunctivae normal.     Pupils: Pupils are equal, round, and reactive to light.  Cardiovascular:     Rate and Rhythm: Normal rate and regular  rhythm.     Heart sounds: No murmur heard.    No friction rub. No gallop.  Pulmonary:     Effort: Pulmonary effort is normal.     Breath sounds: Normal breath sounds.  Abdominal:     General: Abdomen is flat. Bowel sounds are normal.     Palpations: Abdomen is soft.  Musculoskeletal:        General: No swelling. Normal range of motion.     Cervical back: Normal range of motion and neck supple.  Skin:    General: Skin is warm and dry.  Neurological:     General: No focal deficit present.     Mental Status: He is oriented to person, place, and time.  Psychiatric:        Mood and Affect: Mood normal.       Lab Results Lab Results  Component Value Date   WBC 5.7 02/03/2023   HGB 11.2 (L) 02/03/2023   HCT 33.1 (L) 02/03/2023   MCV 82.8 02/03/2023   PLT 192 02/03/2023    Lab Results  Component Value Date   CREATININE 1.08 02/03/2023   BUN 25  (H) 02/03/2023   NA 133 (L) 02/03/2023   K 3.8 02/03/2023   CL 97 (L) 02/03/2023   CO2 24 02/03/2023    Lab Results  Component Value Date   ALT 17 01/27/2023   AST 18 01/27/2023   ALKPHOS 101 01/27/2023   BILITOT 0.5 01/27/2023        Laurice Record, [facility] 02/03/2023, 8:24 AM

## 2023-02-03 NOTE — Plan of Care (Signed)
  Problem: Education: Goal: Patient will understand all VAD equipment and how it functions Outcome: Progressing Goal: Patient will be able to verbalize current INR target range and antiplatelet therapy for discharge home Outcome: Progressing   Problem: Cardiac: Goal: LVAD will function as expected and patient will experience no clinical alarms Outcome: Progressing   Problem: Education: Goal: Knowledge of General Education information will improve Description: Including pain rating scale, medication(s)/side effects and non-pharmacologic comfort measures Outcome: Progressing   Problem: Health Behavior/Discharge Planning: Goal: Ability to manage health-related needs will improve Outcome: Progressing   Problem: Clinical Measurements: Goal: Ability to maintain clinical measurements within normal limits will improve Outcome: Progressing Goal: Will remain free from infection Outcome: Progressing Goal: Diagnostic test results will improve Outcome: Progressing Goal: Respiratory complications will improve Outcome: Progressing Goal: Cardiovascular complication will be avoided Outcome: Progressing   Problem: Activity: Goal: Risk for activity intolerance will decrease Outcome: Progressing   Problem: Nutrition: Goal: Adequate nutrition will be maintained Outcome: Progressing   Problem: Coping: Goal: Level of anxiety will decrease Outcome: Progressing   Problem: Elimination: Goal: Will not experience complications related to bowel motility Outcome: Progressing Goal: Will not experience complications related to urinary retention Outcome: Progressing   Problem: Pain Managment: Goal: General experience of comfort will improve Outcome: Progressing   Problem: Safety: Goal: Ability to remain free from injury will improve Outcome: Progressing   Problem: Skin Integrity: Goal: Risk for impaired skin integrity will decrease Outcome: Progressing   

## 2023-02-04 ENCOUNTER — Inpatient Hospital Stay (HOSPITAL_COMMUNITY): Payer: 59

## 2023-02-04 DIAGNOSIS — T829XXD Unspecified complication of cardiac and vascular prosthetic device, implant and graft, subsequent encounter: Secondary | ICD-10-CM | POA: Diagnosis not present

## 2023-02-04 LAB — CBC
HCT: 32.8 % — ABNORMAL LOW (ref 39.0–52.0)
Hemoglobin: 11.3 g/dL — ABNORMAL LOW (ref 13.0–17.0)
MCH: 28.4 pg (ref 26.0–34.0)
MCHC: 34.5 g/dL (ref 30.0–36.0)
MCV: 82.4 fL (ref 80.0–100.0)
Platelets: 208 10*3/uL (ref 150–400)
RBC: 3.98 MIL/uL — ABNORMAL LOW (ref 4.22–5.81)
RDW: 13.6 % (ref 11.5–15.5)
WBC: 5.8 10*3/uL (ref 4.0–10.5)
nRBC: 0 % (ref 0.0–0.2)

## 2023-02-04 LAB — GLUCOSE, CAPILLARY
Glucose-Capillary: 196 mg/dL — ABNORMAL HIGH (ref 70–99)
Glucose-Capillary: 210 mg/dL — ABNORMAL HIGH (ref 70–99)
Glucose-Capillary: 90 mg/dL (ref 70–99)
Glucose-Capillary: 126 mg/dL — ABNORMAL HIGH (ref 70–99)
Glucose-Capillary: 119 mg/dL — ABNORMAL HIGH (ref 70–99)

## 2023-02-04 LAB — BASIC METABOLIC PANEL
Anion gap: 13 (ref 5–15)
BUN: 26 mg/dL — ABNORMAL HIGH (ref 8–23)
CO2: 23 mmol/L (ref 22–32)
Calcium: 8.5 mg/dL — ABNORMAL LOW (ref 8.9–10.3)
Chloride: 98 mmol/L (ref 98–111)
Creatinine, Ser: 1.12 mg/dL (ref 0.61–1.24)
GFR, Estimated: >60 mL/min (ref >60)
Glucose, Bld: 110 mg/dL — ABNORMAL HIGH (ref 70–99)
Potassium: 4 mmol/L (ref 3.5–5.1)
Sodium: 134 mmol/L — ABNORMAL LOW (ref 135–145)

## 2023-02-04 LAB — CK: Total CK: 62 U/L (ref 49–397)

## 2023-02-04 LAB — HEPARIN LEVEL (UNFRACTIONATED): Heparin Unfractionated: <0.1 IU/mL — ABNORMAL LOW (ref 0.30–0.70)

## 2023-02-04 LAB — MAGNESIUM: Magnesium: 1.8 mg/dL (ref 1.7–2.4)

## 2023-02-04 LAB — PROTIME-INR
INR: 1.9 — ABNORMAL HIGH (ref 0.8–1.2)
Prothrombin Time: 21.9 seconds — ABNORMAL HIGH (ref 11.4–15.2)

## 2023-02-04 LAB — LACTATE DEHYDROGENASE: LDH: 155 U/L (ref 98–192)

## 2023-02-04 MED ORDER — LORAZEPAM 2 MG/ML IJ SOLN
1.0000 mg | Freq: Once | INTRAMUSCULAR | Status: AC
  Administered 2023-02-04: 1 mg via INTRAVENOUS
  Filled 2023-02-04: qty 1

## 2023-02-04 MED ORDER — LORAZEPAM 1 MG PO TABS
1.0000 mg | ORAL_TABLET | Freq: Once | ORAL | Status: DC
  Filled 2023-02-04: qty 1

## 2023-02-04 MED ORDER — SODIUM CHLORIDE 0.9 % IV SOLN
12.5000 mg | Freq: Once | INTRAVENOUS | Status: AC
  Administered 2023-02-04: 12.5 mg via INTRAVENOUS
  Filled 2023-02-04: qty 0.5

## 2023-02-04 MED ORDER — HYDRALAZINE HCL 20 MG/ML IJ SOLN
10.0000 mg | INTRAMUSCULAR | Status: DC | PRN
  Administered 2023-02-04 (×2): 10 mg via INTRAVENOUS
  Filled 2023-02-04 (×2): qty 1

## 2023-02-04 MED ORDER — SODIUM CHLORIDE 0.9 % IV SOLN
8.0000 mg/kg | Freq: Every day | INTRAVENOUS | Status: DC
  Administered 2023-02-04 – 2023-02-19 (×16): 700 mg via INTRAVENOUS
  Filled 2023-02-04 (×17): qty 14

## 2023-02-04 MED ORDER — WARFARIN SODIUM 1 MG PO TABS
1.0000 mg | ORAL_TABLET | Freq: Once | ORAL | Status: AC
  Administered 2023-02-04: 1 mg via ORAL
  Filled 2023-02-04: qty 1

## 2023-02-04 MED ORDER — HYDRALAZINE HCL 20 MG/ML IJ SOLN
10.0000 mg | INTRAMUSCULAR | Status: DC | PRN
  Administered 2023-02-06 – 2023-02-18 (×10): 10 mg via INTRAVENOUS
  Filled 2023-02-04 (×11): qty 1

## 2023-02-04 NOTE — Progress Notes (Signed)
ANTICOAGULATION CONSULT NOTE  Pharmacy Consult for Heparin > warfarin  Indication: LVAD  No Known Allergies  Patient Measurements: Height: '5\' 10"'$  (177.8 cm) Weight: 85.7 kg (188 lb 15 oz) IBW/kg (Calculated) : 73   Vital Signs: Temp: 97.7 F (36.5 C) (02/27 0225) Temp Source: Oral (02/27 0225) BP: 97/84 (02/27 0453) Pulse Rate: 81 (02/27 0453)  Labs: Recent Labs    02/02/23 0026 02/03/23 0021 02/03/23 0600 02/03/23 2026 02/04/23 0017  HGB 11.1* 11.2*  --  11.8* 11.3*  HCT 31.7* 33.1*  --  35.3* 32.8*  PLT 184 192  --  156 208  LABPROT 15.0 16.8*  --   --  21.9*  INR 1.2 1.4*  --   --  1.9*  HEPARINUNFRC <0.10* <0.10*  --   --  <0.10*  CREATININE 1.69*  --  1.08  --   --      Estimated Creatinine Clearance: 73.2 mL/min (by C-G formula based on SCr of 1.08 mg/dL).   Medical History: Past Medical History:  Diagnosis Date   "    Arthritis    CAD (coronary artery disease)    a. s/p CABG in 11/2019 with LIMA-LAD, SVG-OM1, SVG-PDA and SVG-D1   CHF (congestive heart failure) (HCC)    a. EF < 20% by echo in 11/2019   Essential hypertension    PAF (paroxysmal atrial fibrillation) (HCC)    Type 2 diabetes mellitus (HCC)     Medications:   heparin 500 Units/hr (02/03/23 0701)   vancomycin 1,000 mg (02/03/23 2148)    Assessment: 62 yo male on chronic Coumadin for LVAD, poor compliance with outpatient checks.  INR on admission 1.1. S/p OR 2/20 for drive line infection debridement.   Previous Coumadin dose 6 mg daily except 4 mg on Tues, Thurs.  Heparin level came back undetectable on heparin at 500 units/hr (no titration). INR today increased from 1.4 to 1.9. Hgb 11.3, plt 208. LDH 155. No s/sx of bleeding or infusion issues.  Goal of Therapy:  INR 2-2.5 >>  aim closer to 1.8-2.2  Monitor platelets by anticoagulation protocol: Yes   Plan:  Continue IV heparin at 500 units/hr (no titration) - will stop aspirin and heparin infusion Warfarin 1 mg po x 1  today. Daily CBC, heparin level and INR  Antonietta Jewel, PharmD, BCCCP Clinical Pharmacist  Phone: [phone] 02/04/2023 7:37 AM  Please check AMION for all [facility] phone numbers After 10:00 PM, call [place] [phone]

## 2023-02-04 NOTE — Progress Notes (Addendum)
Patient ID: Kevin Millers., male   DOB: 1961/02/12, 62 y.o.   MRN: [id number]   Advanced Heart Failure VAD Team Note  PCP-Cardiologist: Rozann Lesches, MD  AHF: Dr. 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 On IV vancomycin.  OR for debridement w/ wound vac placement 2/20. Plan to return to OR for washout 2/28.  Now back on warfarin + heparin, INR 1.9  VAD MAPs in 60s yesterday, got 250 mL fluid bolus  Feels dizzy this morning. Bedside RN tried to get patient up to chair but he became dizzy/nauseous, back in bed. VAD MAP ~140.   LVAD INTERROGATION:  HeartMate III LVAD:   Flow 3.8 liters/min, speed 5600, power 4.3, PI 5.4 .  84 PI events.  Objective:    Vital Signs:   Temp:  [97.4 F (36.3 C)-98.7 F (37.1 C)] 97.7 F (36.5 C) (02/27 0225) Pulse Rate:  [70-101] 81 (02/27 0453) Resp:  [14-18] 17 (02/27 0453) BP: (69-114)/(57-85) 97/84 (02/27 0453) SpO2:  [97 %-100 %] 98 % (02/27 0453) Weight:  [85.7 kg] 85.7 kg (02/27 0225) Last BM Date : 02/02/23 Mean arterial Pressure 60s-90s. This am with bedside RN 140   Intake/Output:   Intake/Output Summary (Last 24 hours) at 02/04/2023 0708 Last data filed at 02/04/2023 0453 Gross per 24 hour  Intake 120 ml  Output 2050 ml  Net -1930 ml    Physical Exam  General:  Well appearing. No resp difficulty HEENT: Normal Neck: supple. JVP ~9. Carotids 2+ bilat; no bruits. No lymphadenopathy or thyromegaly appreciated. Cor: Mechanical heart sounds with LVAD hum present. Lungs: Clear Abdomen: soft, nontender, nondistended. No hepatosplenomegaly. No bruits or masses. Good bowel sounds. Driveline: C/D/I; securement device intact. Wound vac in place.  Extremities: no cyanosis, clubbing, rash, edema Neuro: alert & orientedx3, cranial nerves grossly intact. moves all 4 extremities w/o difficulty. Affect pleasant  Telemetry   NSR 90s (Personally reviewed)    EKG   No new EKG  Labs   Basic Metabolic  Panel: Recent Labs  Lab 01/30/23 2242 01/31/23 0030 02/01/23 0032 02/02/23 0026 02/03/23 0600 02/04/23 0017  NA 132* 133* 132* 131* 133*  --   K 3.9 4.1 3.7 3.9 3.8  --   CL 93* 93* 95* 97* 97*  --   CO2 '25 27 26 25 24  '$ --   GLUCOSE 145* 161* 156* 155* 141*  --   BUN 15 14 26* 35* 25*  --   CREATININE 1.13 1.21 1.31* 1.69* 1.08  --   CALCIUM 9.0 9.1 8.5* 8.1* 8.4*  --   MG 1.9  --   --   --   --  1.8   Liver Function Tests: No results for input(s): "AST", "ALT", "ALKPHOS", "BILITOT", "PROT", "ALBUMIN" in the last 168 hours.  CBC: Recent Labs  Lab 02/01/23 0032 02/02/23 0026 02/03/23 0021 02/03/23 2026 02/04/23 0017  WBC 6.1 5.0 5.7 5.7 5.8  HGB 11.4* 11.1* 11.2* 11.8* 11.3*  HCT 33.2* 31.7* 33.1* 35.3* 32.8*  MCV 84.5 82.8 82.8 84.7 82.4  PLT 197 184 192 156 208   INR: Recent Labs  Lab 01/31/23 0030 02/01/23 0032 02/02/23 0026 02/03/23 0021 02/04/23 0017  INR 1.1 1.2 1.2 1.4* 1.9*    Other results: EKG:   Imaging   No results found.  Medications:     Scheduled Medications:  allopurinol  100 mg Oral Daily   amiodarone  200 mg Oral BID  aspirin EC  325 mg Oral Daily   feeding supplement (GLUCERNA SHAKE)  237 mL Oral TID WC   glipiZIDE  5 mg Oral Q breakfast   guaiFENesin  600 mg Oral BID   insulin aspart  0-24 Units Subcutaneous TID WC   insulin aspart  0-5 Units Subcutaneous QHS   loratadine  10 mg Oral Daily   metFORMIN  500 mg Oral BID WC   metoprolol succinate  25 mg Oral Daily   minocycline  200 mg Oral BID   multivitamin with minerals  1 tablet Oral Q24H   pantoprazole  40 mg Oral Daily   rosuvastatin  20 mg Oral Daily   sertraline  50 mg Oral Daily   tamsulosin  0.4 mg Oral Daily   thiamine (VITAMIN B1) injection  100 mg Intravenous Daily   traZODone  150 mg Oral QHS   Warfarin - Pharmacist Dosing Inpatient   Does not apply q1600   zinc sulfate  220 mg Oral Daily    Infusions:  heparin 500 Units/hr (02/03/23 0701)   vancomycin  1,000 mg (02/03/23 2148)    PRN Medications: acetaminophen, colchicine, fluticasone, ipratropium, levalbuterol, ondansetron (ZOFRAN) IV, oxyCODONE   Patient Profile  62 y.o. 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 type 2 diabetes mellitus, COPD, hypertension, and HMIII VAD.    Admitted with recurrent LVAD complication driveline infection + suspected AECOPD Assessment/Plan:   1. LVAD Complication---> Recurrent driveline infection: MRSA driveline infection, admitted in 6/23 and again in 7/23-8/23 with multiple trips to the OR for debridement. He had completed vancomycin and then long-term doxycycline. Most recent culture PTA grew acinetobacter and MRSA. ID started minocycline. He had been off minocycline for at least 3 days prior to this admit w/ copious drainage around driveline. CT chest/abd/pelvis showed stranding around the driveline and small amount of fluid. Started on Vanc and Meropenum per ID. Now off meropenem (2/21).  OR for debridement w/ wound vac placement 2/20. Wound culture with MRSA, blood cultures negative.  Afebrile.  - Will need 6 wks vancomycin then back to minocycline alone.   - Plan to return to OR for washout/vac change 2/28. Keep INR 1.8-2.2 until then.  2. Chronic HFrEF: Echo 10/21 with EF 20-25%, mildly decreased RV function. LHC/RHC in 12/21 with patent grafts, low output. Suspect mixed ischemic/nonischemic cardiomyopathy (prior heavy ETOH and drugs as well as CAD).  No ETOH, drugs, smoking since CABG in 12/20. Admitted with cardiogenic shock in 12/21, had placement of Impella 5.5 initially, now s/p Heartmate 3 LVAD on 11/17/20.  Ramp echo 2/23 with speed decreased to 5500 rpm. Volume status stable. Now off entresto due to lower Maps and elevated creatinine.  Does not need lasix.  - LDH 155 today.  - Warfarin  restarted, goal INR 1.8-2.2 until surgery. Covering w/ heparin gtt. INR 1.9.  - Not a candidate for transplant with ongoing drug use.  3. HTN: MAPs elevated low 100s on admit, poor compliance with meds. Improved w/ restart of home meds but hypotensive overnight.  Has fluctuated significantly. MAP 60s-90s, up to 140s this morning after activity.  - Can continue Toprol XL 25 mg daily  - Entresto stopped due to lower Maps.   - Will add hydralazine PRN 4. Polysubstance Abuse: Recently started using methamphetamine again.  UDS + for amphetamines.  - SW team following. Unfortunately,  limited resources for inpatient substance use due LVAD 5. PAF: S/p Maze and LA appendage clip with CABG in 12/20.  NSR this admission.  6. 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  Still some wheezing but has improved.   - Continue scheduled atrovent.  - Levalbuterol prn.  - Have avoided prednisone with surgery at driveline site for recurrent infection.   7. DMII - SSI  8.  NSVT/PVCs: Frequent NSVT 2/23, may have been dry after diuresis.  Amiodarone gtt started to suppress.  - Continue po amiodarone 200 mg bid.  - Would eventually stop this medication, avoid with COPD.    Patient refusing to work with PT, not motivated and expresses frustration with his overall situation.  Ongoing drug use, no longer living with his wife.  Has been living with his mother who cannot care for him.  He will need SNF for IV antibiotics at discharge.  TOC Team working to find SNF.    I reviewed the LVAD parameters from today, and compared the results to the patient's prior recorded data.  No programming changes were made.  The LVAD is functioning within specified parameters.  The patient performs LVAD self-test daily.  LVAD interrogation was negative for any significant power changes, alarms or PI events/speed drops.  LVAD equipment check completed and is in good working order.  Back-up equipment present.   LVAD education done on emergency procedures and  precautions and reviewed exit site care.  Length of Stay: [place], NP 02/04/2023, 7:08 AM  VAD Team --- VAD ISSUES ONLY--- Pager [phone] (7am - 7am)  Advanced Heart Failure Team  Pager [phone] (M-F; 7a - 5p)  Please contact [facility] for night-coverage after hours (5p -7a ) and weekends on [email or web address]  Patient seen and examined with the above-signed Advanced Practice Provider and/or Housestaff. I personally reviewed laboratory data, imaging studies and relevant notes. I independently examined the patient and formulated the important aspects of the plan. I have edited the note to reflect any of my changes or salient points. I have personally discussed the plan with the patient and/or family.  Says he feels miserable. Dry heaving all day. Unable to hold anything down.MAPs have been up. Denies further dizziness or other neuro symptoms.   General: Frustrated. Weakr NAD.  HEENT: normal  Neck: supple. JVP not elevated.  Carotids 2+ bilat; no bruits. No lymphadenopathy or thryomegaly appreciated. Cor: LVAD hum.  Lungs: Clear. Abdomen: obese soft, nontender, non-distended. No hepatosplenomegaly. No bruits or masses. Good bowel sounds. Driveline site with wound vac. Anchor in place.  Extremities: no cyanosis, clubbing, rash. Warm no edema  Neuro: alert & oriented x 3. No focal deficits. Moves all 4 without problem   Etiology of nausea unclear. Spoke with ID who feels it may be related to vanc as he has had similar reaction in past. Will change to dapto.   KUP obtained and normal bowel gas pattern. With associated HTN my suspicion is for drug withdrawal. Will start ativan. Will probably need full CIWA protocol.   Can use hydralazine prn for BP. If symptoms persist can consdier head CZT to exclude bleed but symptom complex doesn't support this.   Continue heparin. Continue wound vac.  VAD interrogated personally. Parameters stable.  Glori Bickers, MD  10:30 PM  \

## 2023-02-04 NOTE — Plan of Care (Signed)
  Problem: Education: Goal: Patient will understand all VAD equipment and how it functions Outcome: Progressing Goal: Patient will be able to verbalize current INR target range and antiplatelet therapy for discharge home Outcome: Progressing   Problem: Cardiac: Goal: LVAD will function as expected and patient will experience no clinical alarms Outcome: Progressing   Problem: Education: Goal: Knowledge of General Education information will improve Description: Including pain rating scale, medication(s)/side effects and non-pharmacologic comfort measures Outcome: Progressing   Problem: Health Behavior/Discharge Planning: Goal: Ability to manage health-related needs will improve Outcome: Progressing

## 2023-02-04 NOTE — Progress Notes (Signed)
LVAD Coordinator Rounding Note:  Admitted 01/27/23 to Heart Failure service from ED due to driveline infection. Started on IV antibiotics.   S/p driveline debridement in OR 2/21. Wound vac dressing clean, dry, intact. Scant serosanguinous drainage in canister. Plan to return to OR for washout 02/05/23 per Dr Prescott Gum.   Received page yesterday evening regarding low BP. Per Dr Haroldine Laws pt was given 250 cc NS bolus, and encouraged to increase PO intake. BP improved.   Pt laying in bed this morning. States he is very nauseated, and is dry heaving. Has received PRN Zofran.   ID is consulted. Currently on Vancomycin and Meropenem per ID. Pt has history of intractable nausea/vomiting with Vancomycin use. Discussed with Dr Haroldine Laws and Forestine Na NP- will plan to change to daily Daptomycin as pt is symptomatic.   Pt will need wound vac and home IV antibiotics at discharge. Pt will need placement in SNF for IV antibiotics at discharge as he is living with his mom and she is unable to help him with care/medication administration. VAD coordinator spoke with his wife last week and they are not currently getting along. It would be much safer for him to be in a SNF post discharge for IV antibiotics. Inpatient social work awaiting bed offers for placement. Pt has been denied admission to [facility], [facility], and [facility]. Search for placement ongoing.   Controller electively exchanged this admission. No further low voltage hazard alarms seen on interrogation.  Vital signs: Temp:  98.1 HR: 94 Doppler Pressure: 140 Automatic BP: 118/97 (105) O2 Sat: 98% on RA Wt: 192.9>197>195.9>190.9>187.6>188.9 lbs   LVAD interrogation reveals:  Speed: 5600 Flow: 4.3 Power: 4.2 w PI: 3.3 Alarms: none Events: 100+ today Hct: 33  Fixed speed:  5600 Low speed limit:  5300  Drive Line:  Wound vac dressing clean, dry, intact.  Anchor in place x 2. Suction -100. Suctioning well, no alarms noted. Small  amount of serosanguinous drainage noted in canister. Plan for washout and wound vac change in OR 02/05/23 per Dr Prescott Gum.   Labs:  LDH trend: 203>187>142>145>139>161>155  INR trend: 1.1>1.1>1.4>1.9  Anticoagulation Plan: -INR Goal: 2.0 - 2.5 -ASA Dose: none -Heparin 500 u/hr   ICD: N/A  Infection:  01/27/23>> blood cultures>> no growth 4 days 01/27/23>>driveline culture>>FEW METHICILLIN RESISTANT STAPHYLOCOCCUS AUREUS  01/28/23>>driveline cx from OR>>FEW METHICILLIN RESISTANT STAPHYLOCOCCUS AUREUS; final  Plan/Recommendations:  Page VAD coordinator with equipment issues or driveline problems Plan for to return to OR Wednesday for washout with Dr Lucianne Lei Simon Rhein RN [place] Coordinator  Office: [phone]  24/7 Pager: [phone]

## 2023-02-05 ENCOUNTER — Inpatient Hospital Stay (HOSPITAL_COMMUNITY): Payer: 59 | Admitting: Certified Registered Nurse Anesthetist

## 2023-02-05 ENCOUNTER — Other Ambulatory Visit: Payer: Self-pay

## 2023-02-05 ENCOUNTER — Encounter (HOSPITAL_COMMUNITY): Payer: Self-pay | Admitting: Cardiology

## 2023-02-05 ENCOUNTER — Encounter (HOSPITAL_COMMUNITY)
Admission: EM | Disposition: A | Discharge status: Skilled Nursing Facility | Payer: Self-pay | Source: Ambulatory Visit | Attending: Cardiology

## 2023-02-05 DIAGNOSIS — I25119 Atherosclerotic heart disease of native coronary artery with unspecified angina pectoris: Secondary | ICD-10-CM | POA: Diagnosis not present

## 2023-02-05 DIAGNOSIS — T827XXA Infection and inflammatory reaction due to other cardiac and vascular devices, implants and grafts, initial encounter: Secondary | ICD-10-CM

## 2023-02-05 DIAGNOSIS — T829XXD Unspecified complication of cardiac and vascular prosthetic device, implant and graft, subsequent encounter: Secondary | ICD-10-CM | POA: Diagnosis not present

## 2023-02-05 DIAGNOSIS — T829XXA Unspecified complication of cardiac and vascular prosthetic device, implant and graft, initial encounter: Secondary | ICD-10-CM | POA: Diagnosis not present

## 2023-02-05 DIAGNOSIS — I110 Hypertensive heart disease with heart failure: Secondary | ICD-10-CM | POA: Diagnosis not present

## 2023-02-05 DIAGNOSIS — I509 Heart failure, unspecified: Secondary | ICD-10-CM

## 2023-02-05 DIAGNOSIS — Z87891 Personal history of nicotine dependence: Secondary | ICD-10-CM

## 2023-02-05 DIAGNOSIS — E119 Type 2 diabetes mellitus without complications: Secondary | ICD-10-CM

## 2023-02-05 HISTORY — PX: APPLICATION OF WOUND VAC: SHX5189

## 2023-02-05 HISTORY — PX: STERNAL WOUND DEBRIDEMENT: SHX1058

## 2023-02-05 LAB — GLUCOSE, CAPILLARY
Glucose-Capillary: 128 mg/dL — ABNORMAL HIGH (ref 70–99)
Glucose-Capillary: 114 mg/dL — ABNORMAL HIGH (ref 70–99)
Glucose-Capillary: 119 mg/dL — ABNORMAL HIGH (ref 70–99)
Glucose-Capillary: 198 mg/dL — ABNORMAL HIGH (ref 70–99)
Glucose-Capillary: 178 mg/dL — ABNORMAL HIGH (ref 70–99)
Glucose-Capillary: 237 mg/dL — ABNORMAL HIGH (ref 70–99)

## 2023-02-05 LAB — CBC
HCT: 33.5 % — ABNORMAL LOW (ref 39.0–52.0)
Hemoglobin: 11.6 g/dL — ABNORMAL LOW (ref 13.0–17.0)
MCH: 28.3 pg (ref 26.0–34.0)
MCHC: 34.6 g/dL (ref 30.0–36.0)
MCV: 81.7 fL (ref 80.0–100.0)
Platelets: 245 10*3/uL (ref 150–400)
RBC: 4.1 MIL/uL — ABNORMAL LOW (ref 4.22–5.81)
RDW: 13.4 % (ref 11.5–15.5)
WBC: 5.8 10*3/uL (ref 4.0–10.5)
nRBC: 0 % (ref 0.0–0.2)

## 2023-02-05 LAB — AMYLASE: Amylase: 52 U/L (ref 28–100)

## 2023-02-05 LAB — BASIC METABOLIC PANEL
Anion gap: 12 (ref 5–15)
BUN: 16 mg/dL (ref 8–23)
CO2: 24 mmol/L (ref 22–32)
Calcium: 8.8 mg/dL — ABNORMAL LOW (ref 8.9–10.3)
Chloride: 99 mmol/L (ref 98–111)
Creatinine, Ser: 1.15 mg/dL (ref 0.61–1.24)
GFR, Estimated: >60 mL/min (ref >60)
Glucose, Bld: 120 mg/dL — ABNORMAL HIGH (ref 70–99)
Potassium: 3.7 mmol/L (ref 3.5–5.1)
Sodium: 135 mmol/L (ref 135–145)

## 2023-02-05 LAB — TYPE AND SCREEN
ABO/RH(D): B POS
Antibody Screen: NEGATIVE

## 2023-02-05 LAB — PROTIME-INR
INR: 2.2 — ABNORMAL HIGH (ref 0.8–1.2)
Prothrombin Time: 24.1 seconds — ABNORMAL HIGH (ref 11.4–15.2)

## 2023-02-05 LAB — LACTATE DEHYDROGENASE: LDH: 148 U/L (ref 98–192)

## 2023-02-05 SURGERY — DEBRIDEMENT, WOUND, STERNUM
Anesthesia: General

## 2023-02-05 MED ORDER — LACTATED RINGERS IV SOLN: INTRAVENOUS | Status: DC | PRN

## 2023-02-05 MED ORDER — FENTANYL CITRATE (PF) 100 MCG/2ML IJ SOLN: 25.0000 ug | INTRAMUSCULAR | Status: DC | PRN

## 2023-02-05 MED ORDER — SODIUM CHLORIDE 0.9 % IV SOLN
Freq: Once | INTRAVENOUS | Status: AC
  Administered 2023-02-05: 500 mL
  Filled 2023-02-05: qty 10

## 2023-02-05 MED ORDER — PROMETHAZINE HCL 25 MG/ML IJ SOLN: 6.2500 mg | INTRAMUSCULAR | Status: DC | PRN

## 2023-02-05 MED ORDER — PROPOFOL 500 MG/50ML IV EMUL
INTRAVENOUS | Status: DC | PRN
  Administered 2023-02-05: 75 ug/kg/min via INTRAVENOUS

## 2023-02-05 MED ORDER — PROPOFOL 10 MG/ML IV BOLUS
INTRAVENOUS | Status: DC | PRN
  Administered 2023-02-05: 10 mg via INTRAVENOUS

## 2023-02-05 MED ORDER — FENTANYL CITRATE (PF) 250 MCG/5ML IJ SOLN
INTRAMUSCULAR | Status: AC
  Filled 2023-02-05: qty 5

## 2023-02-05 MED ORDER — PROPOFOL 10 MG/ML IV BOLUS
INTRAVENOUS | Status: AC
  Filled 2023-02-05: qty 20

## 2023-02-05 MED ORDER — MIDAZOLAM HCL 2 MG/2ML IJ SOLN
INTRAMUSCULAR | Status: AC
  Filled 2023-02-05: qty 2

## 2023-02-05 MED ORDER — WARFARIN SODIUM 1 MG PO TABS
1.0000 mg | ORAL_TABLET | Freq: Once | ORAL | Status: AC
  Administered 2023-02-05: 1 mg via ORAL
  Filled 2023-02-05: qty 1

## 2023-02-05 MED ORDER — ETOMIDATE 2 MG/ML IV SOLN
INTRAVENOUS | Status: AC
  Filled 2023-02-05: qty 10

## 2023-02-05 MED ORDER — PHENYLEPHRINE 80 MCG/ML (10ML) SYRINGE FOR IV PUSH (FOR BLOOD PRESSURE SUPPORT)
PREFILLED_SYRINGE | INTRAVENOUS | Status: DC | PRN
  Administered 2023-02-05: 160 ug via INTRAVENOUS

## 2023-02-05 MED ORDER — POTASSIUM CHLORIDE CRYS ER 20 MEQ PO TBCR
20.0000 meq | EXTENDED_RELEASE_TABLET | Freq: Once | ORAL | Status: AC
  Administered 2023-02-05: 20 meq via ORAL
  Filled 2023-02-05: qty 1

## 2023-02-05 MED ORDER — ORAL CARE MOUTH RINSE: 15.0000 mL | OROMUCOSAL | Status: DC | PRN

## 2023-02-05 MED ORDER — 0.9 % SODIUM CHLORIDE (POUR BTL) OPTIME
TOPICAL | Status: DC | PRN
  Administered 2023-02-05: 2000 mL

## 2023-02-05 MED ORDER — MIDAZOLAM HCL 2 MG/2ML IJ SOLN
INTRAMUSCULAR | Status: DC | PRN
  Administered 2023-02-05: 1 mg via INTRAVENOUS

## 2023-02-05 MED ORDER — ALBUTEROL SULFATE HFA 108 (90 BASE) MCG/ACT IN AERS
INHALATION_SPRAY | RESPIRATORY_TRACT | Status: DC | PRN
  Administered 2023-02-05: 1 via RESPIRATORY_TRACT

## 2023-02-05 MED ORDER — PHENYLEPHRINE HCL-NACL 20-0.9 MG/250ML-% IV SOLN
INTRAVENOUS | Status: DC | PRN
  Administered 2023-02-05: 50 ug/min via INTRAVENOUS

## 2023-02-05 MED ORDER — FENTANYL CITRATE (PF) 250 MCG/5ML IJ SOLN
INTRAMUSCULAR | Status: DC | PRN
  Administered 2023-02-05 (×3): 25 ug via INTRAVENOUS

## 2023-02-05 MED ORDER — OXYCODONE HCL 5 MG PO TABS: 5.0000 mg | ORAL_TABLET | ORAL | Status: AC | PRN

## 2023-02-05 MED ORDER — ALBUMIN HUMAN 5 % IV SOLN: INTRAVENOUS | Status: DC | PRN

## 2023-02-05 SURGICAL SUPPLY — 75 items
APL SKNCLS STERI-STRIP NONHPOA (GAUZE/BANDAGES/DRESSINGS)
ATTRACTOMAT 16X20 MAGNETIC DRP (DRAPES) ×1 IMPLANT
BAG DECANTER FOR FLEXI CONT (MISCELLANEOUS) ×1 IMPLANT
BENZOIN TINCTURE PRP APPL 2/3 (GAUZE/BANDAGES/DRESSINGS) IMPLANT
BLADE CLIPPER SURG (BLADE) ×1 IMPLANT
BLADE SURG 10 STRL SS (BLADE) ×1 IMPLANT
BLADE SURG 15 STRL LF DISP TIS (BLADE) IMPLANT
BLADE SURG 15 STRL SS (BLADE)
BNDG GAUZE DERMACEA FLUFF 4 (GAUZE/BANDAGES/DRESSINGS) IMPLANT
BNDG GZE DERMACEA 4 6PLY (GAUZE/BANDAGES/DRESSINGS)
CANISTER SUCT 3000ML PPV (MISCELLANEOUS) ×1 IMPLANT
CANISTER WOUND CARE 500ML ATS (WOUND CARE) ×1 IMPLANT
CANISTER WOUNDNEG PRESSURE 500 (CANNISTER) IMPLANT
CATH FOLEY 2WAY SLVR  5CC 16FR (CATHETERS)
CATH FOLEY 2WAY SLVR 5CC 16FR (CATHETERS) IMPLANT
CATH THORACIC 28FR RT ANG (CATHETERS) IMPLANT
CATH THORACIC 36FR (CATHETERS) IMPLANT
CLIP TI WIDE RED SMALL 24 (CLIP) IMPLANT
CNTNR URN SCR LID CUP LEK RST (MISCELLANEOUS) IMPLANT
CONN Y 3/8X3/8X3/8  BEN (MISCELLANEOUS)
CONN Y 3/8X3/8X3/8 BEN (MISCELLANEOUS) IMPLANT
CONT SPEC 4OZ STRL OR WHT (MISCELLANEOUS)
CONTAINER PROTECT SURGISLUSH (MISCELLANEOUS) ×2 IMPLANT
COVER MAYO STAND STRL (DRAPES) IMPLANT
COVER SURGICAL LIGHT HANDLE (MISCELLANEOUS) ×2 IMPLANT
DRAPE DERMATAC (DRAPES) IMPLANT
DRAPE INCISE IOBAN 66X45 STRL (DRAPES) IMPLANT
DRAPE LAPAROSCOPIC ABDOMINAL (DRAPES) ×1 IMPLANT
DRAPE SLUSH/WARMER DISC (DRAPES) IMPLANT
DRAPE WARM FLUID 44X44 (DRAPES) IMPLANT
DRSG AQUACEL AG ADV 3.5X14 (GAUZE/BANDAGES/DRESSINGS) ×1 IMPLANT
DRSG VAC ATS LRG SENSATRAC (GAUZE/BANDAGES/DRESSINGS) ×1 IMPLANT
DRSG VAC ATS MED SENSATRAC (GAUZE/BANDAGES/DRESSINGS) ×1 IMPLANT
DRSG VAC ATS SM SENSATRAC (GAUZE/BANDAGES/DRESSINGS) ×1 IMPLANT
DRSG VAC GRANUFOAM SM (GAUZE/BANDAGES/DRESSINGS) IMPLANT
ELECT REM PT RETURN 9FT ADLT (ELECTROSURGICAL) ×1
ELECTRODE REM PT RTRN 9FT ADLT (ELECTROSURGICAL) ×1 IMPLANT
GAUZE 4X4 16PLY ~~LOC~~+RFID DBL (SPONGE) ×1 IMPLANT
GAUZE PAD ABD 8X10 STRL (GAUZE/BANDAGES/DRESSINGS) IMPLANT
GAUZE SPONGE 4X4 12PLY STRL (GAUZE/BANDAGES/DRESSINGS) ×1 IMPLANT
GAUZE XEROFORM 5X9 LF (GAUZE/BANDAGES/DRESSINGS) IMPLANT
GLOVE BIO SURGEON STRL SZ7.5 (GLOVE) ×2 IMPLANT
GOWN STRL REUS W/ TWL LRG LVL3 (GOWN DISPOSABLE) ×4 IMPLANT
GOWN STRL REUS W/TWL LRG LVL3 (GOWN DISPOSABLE) ×4
GRAFT SKIN WND SUPRA SDRM 9X9 (Tissue) IMPLANT
HANDPIECE INTERPULSE COAX TIP (DISPOSABLE) ×1
HEMOSTAT POWDER SURGIFOAM 1G (HEMOSTASIS) IMPLANT
HEMOSTAT SURGICEL 2X14 (HEMOSTASIS) IMPLANT
KIT BASIN OR (CUSTOM PROCEDURE TRAY) ×1 IMPLANT
KIT SUCTION CATH 14FR (SUCTIONS) IMPLANT
KIT TURNOVER KIT B (KITS) ×1 IMPLANT
NS IRRIG 1000ML POUR BTL (IV SOLUTION) ×1 IMPLANT
PACK CHEST (CUSTOM PROCEDURE TRAY) ×1 IMPLANT
PACK GENERAL/GYN (CUSTOM PROCEDURE TRAY) ×1 IMPLANT
PAD ARMBOARD 7.5X6 YLW CONV (MISCELLANEOUS) ×2 IMPLANT
SET HNDPC FAN SPRY TIP SCT (DISPOSABLE) ×1 IMPLANT
SOL PREP POV-IOD 4OZ 10% (MISCELLANEOUS) IMPLANT
SPONGE T-LAP 18X18 ~~LOC~~+RFID (SPONGE) ×5 IMPLANT
SPONGE T-LAP 4X18 ~~LOC~~+RFID (SPONGE) ×1 IMPLANT
STAPLER VISISTAT 35W (STAPLE) IMPLANT
SUT ETHILON 3 0 FSL (SUTURE) IMPLANT
SUT STEEL 6MS V (SUTURE) IMPLANT
SUT STEEL STERNAL CCS#1 18IN (SUTURE) IMPLANT
SUT STEEL SZ 6 DBL 3X14 BALL (SUTURE) IMPLANT
SUT VIC AB 1 CTX 36 (SUTURE) ×2
SUT VIC AB 1 CTX36XBRD ANBCTR (SUTURE) ×2 IMPLANT
SUT VIC AB 2-0 CTX 27 (SUTURE) ×2 IMPLANT
SUT VIC AB 3-0 X1 27 (SUTURE) ×2 IMPLANT
SWAB COLLECTION DEVICE MRSA (MISCELLANEOUS) IMPLANT
SWAB CULTURE ESWAB REG 1ML (MISCELLANEOUS) IMPLANT
SYR 5ML LL (SYRINGE) IMPLANT
TOWEL GREEN STERILE (TOWEL DISPOSABLE) ×1 IMPLANT
TOWEL GREEN STERILE FF (TOWEL DISPOSABLE) ×1 IMPLANT
TRAY FOLEY MTR SLVR 16FR STAT (SET/KITS/TRAYS/PACK) IMPLANT
WATER STERILE IRR 1000ML POUR (IV SOLUTION) ×1 IMPLANT

## 2023-02-05 NOTE — Transfer of Care (Signed)
Immediate Anesthesia Transfer of Care Note  Patient: Kevin Underwood.  Procedure(s) Performed: ABDOMINAL DRIVELINE WOUND DEBRIDEMENT WOUND VAC CHANGE  Patient Location: PACU  Anesthesia Type:MAC  Level of Consciousness: awake, alert , and oriented  Airway & Oxygen Therapy: Patient Spontanous Breathing and Patient connected to face mask oxygen  Post-op Assessment: Report given to RN and Post -op Vital signs reviewed and stable  Post vital signs: Reviewed and stable  Last Vitals: SEE PACU VITALS. VSS. Vitals Value Taken Time  BP    Temp    Pulse    Resp    SpO2      Last Pain:  Vitals:   02/05/23 0850  TempSrc: Oral  PainSc:       Patients Stated Pain Goal: 0 (0000000 [id number])  Complications: No notable events documented.

## 2023-02-05 NOTE — OR Nursing (Signed)
Vashe wound irrigant used by MD prior to placing wound vac sponge.  [id number] Exp: 05/08/2024

## 2023-02-05 NOTE — Progress Notes (Signed)
ANTICOAGULATION CONSULT NOTE  Pharmacy Consult for Heparin > warfarin  Indication: LVAD  No Known Allergies  Patient Measurements: Height: '5\' 10"'$  (177.8 cm) Weight: 83.5 kg (184 lb 1.4 oz) IBW/kg (Calculated) : 73   Vital Signs: Temp: 98.3 F (36.8 C) (02/28 0744) Temp Source: Oral (02/28 0744) BP: 82/65 (02/28 0744) Pulse Rate: 98 (02/28 0744)  Labs: Recent Labs    02/03/23 0021 02/03/23 0600 02/03/23 2026 02/04/23 0017 02/04/23 1101 02/05/23 0701  HGB 11.2*  --  11.8* 11.3*  --  11.6*  HCT 33.1*  --  35.3* 32.8*  --  33.5*  PLT 192  --  156 208  --  245  LABPROT 16.8*  --   --  21.9*  --   --   INR 1.4*  --   --  1.9*  --   --   HEPARINUNFRC <0.10*  --   --  <0.10*  --   --   CREATININE  --  1.08  --  1.12  --   --   CKTOTAL  --   --   --   --  62  --      Estimated Creatinine Clearance: 70.6 mL/min (by C-G formula based on SCr of 1.12 mg/dL).   Medical History: Past Medical History:  Diagnosis Date   "    Arthritis    CAD (coronary artery disease)    a. s/p CABG in 11/2019 with LIMA-LAD, SVG-OM1, SVG-PDA and SVG-D1   CHF (congestive heart failure) (HCC)    a. EF < 20% by echo in 11/2019   Essential hypertension    PAF (paroxysmal atrial fibrillation) (HCC)    Type 2 diabetes mellitus (HCC)     Medications:   DAPTOmycin (CUBICIN) 700 mg in sodium chloride 0.9 % IVPB 700 mg (02/04/23 1319)    Assessment: 62 yo male on chronic Coumadin for LVAD, poor compliance with outpatient checks.  INR on admission 1.1. S/p OR 2/20 for drive line infection debridement.   Previous Coumadin dose 6 mg daily except 4 mg on Tues, Thurs.  Heparin and aspirin stopped last night given INR close to 2. INR today increased from 1.9 to 2.2. Hgb 11.6, plt 245. LDH 148. No s/sx of bleeding or infusion issues. Underwent I&D with wound VAC change 2/28.  Goal of Therapy:  INR 2-2.5 >>  aim closer to 2-2.3 for now Monitor platelets by anticoagulation protocol: Yes   Plan:   Warfarin 1 mg po x 1 today. Daily CBC, heparin level and INR  Antonietta Jewel, PharmD, BCCCP Clinical Pharmacist  Phone: [phone] 02/05/2023 7:56 AM  Please check AMION for all [facility] phone numbers After 10:00 PM, call [place] [phone]

## 2023-02-05 NOTE — Brief Op Note (Signed)
02/05/2023  10:27 AM  PATIENT:  Kevin Underwood.  62 y.o. male  PRE-OPERATIVE DIAGNOSIS:  DRIVELINE INFECTION Heartmate 3  POST-OPERATIVE DIAGNOSIS:  DRIVELINE INFECTION Heartmate 3  PROCEDURE:  Procedure(s): ABDOMINAL DRIVELINE WOUND DEBRIDEMENT (N/A) WOUND VAC CHANGE (N/A) Application SDRM tissue matrix, 9cm  SURGEON:  Surgeon(s) and Role:    [name], MD - Primary  PHYSICIAN ASSISTANT:   ASSISTANTS: RN   ANESTHESIA:   MAC  EBL:  none  BLOOD ADMINISTERED:none  DRAINS: none   LOCAL MEDICATIONS USED:  NONE  SPECIMEN:  No Specimen  DISPOSITION OF SPECIMEN:  N/A  COUNTS:  YES  TOURNIQUET:  * No tourniquets in log *  DICTATION: .Dragon Dictation  PLAN OF CARE:  return to 2C  PATIENT DISPOSITION:  PACU - hemodynamically stable.   Delay start of Pharmacological VTE agent (>24hrs) due to surgical blood loss or risk of bleeding: no, continue coumadin goal 2.0-2.3

## 2023-02-05 NOTE — Anesthesia Procedure Notes (Signed)
Procedure Name: MAC Date/Time: 02/05/2023 9:40 AM  Performed by: Dorthea Cove, CRNAPre-anesthesia Checklist: Patient identified, Emergency Drugs available, Suction available, Patient being monitored and Timeout performed Patient Re-evaluated:Patient Re-evaluated prior to induction Oxygen Delivery Method: Simple face mask Preoxygenation: Pre-oxygenation with 100% oxygen Induction Type: IV induction Placement Confirmation: positive ETCO2 and CO2 detector Dental Injury: Teeth and Oropharynx as per pre-operative assessment

## 2023-02-05 NOTE — Progress Notes (Signed)
LVAD Coordinator Rounding Note:  Admitted 01/27/23 to Heart Failure service from ED due to driveline infection. Started on IV antibiotics.   S/p driveline debridement in OR 2/21. Wound vac dressing clean, dry, intact. Scant serosanguinous drainage in canister. Plan to return to OR for washout 02/05/23 per Dr Prescott Gum.    Met patient in OR holding. States he is feeling much better than yesterday with Vancomycin stop and PRN Ativan. Tolerating Daptomycin well.   Pt will need wound vac and home IV antibiotics at discharge. Pt will need placement in SNF for IV antibiotics at discharge as he is living with his mom and she is unable to help him with care/medication administration. VAD coordinator spoke with his wife last week and they are not currently getting along. It would be much safer for him to be in a SNF post discharge for IV antibiotics. Inpatient social work awaiting bed offers for placement. Pt has been denied admission to [facility], [facility], and [facility]. Search for placement ongoing.   Controller electively exchanged this admission. No further low voltage hazard alarms seen on interrogation.  Vital signs: Temp:  98.0 HR: 72 Doppler Pressure: 70 Automatic BP: 85/75 (81) O2 Sat: 100% on RA Wt: 192.9>197>195.9>190.9>187.6>188.9>184.1 lbs   LVAD interrogation reveals:  Speed: 5600 Flow: 4.5 Power: 4.2 w PI: 3.8 Alarms: none Events: 100+ today Hct: 33  Fixed speed:  5600 Low speed limit:  5300  Drive Line:  Wound vac dressing clean, dry, intact.  Anchor in place x 2. Suction -100. Suctioning well, no alarms noted. Small amount of serosanguinous drainage noted in canister. Plan for washout and wound vac change in OR today per Dr Prescott Gum.   Labs:  LDH trend: 203>187>142>145>139>161>155>148  INR trend: 1.1>1.1>1.4>1.9>2.2  Anticoagulation Plan: -INR Goal: 2.0 - 2.5 -ASA Dose: none -Heparin 500 u/hr   ICD: N/A  Infection:  01/27/23>> blood cultures>> no  growth 4 days 01/27/23>>driveline culture>>FEW METHICILLIN RESISTANT STAPHYLOCOCCUS AUREUS  01/28/23>>driveline cx from OR>>FEW METHICILLIN RESISTANT STAPHYLOCOCCUS AUREUS; final  Plan/Recommendations:  Page VAD coordinator with equipment issues or driveline problems Plan for to return to OR next Wednesday for washout with Dr Lucianne Lei Simon Rhein RN [place] Coordinator  Office: [phone]  24/7 Pager: [phone]

## 2023-02-05 NOTE — Progress Notes (Signed)
Pre Procedure note for inpatients:   Kevin Underwood. has been scheduled for Procedure(s): ABDOMINAL DRIVELINE WOUND DEBRIDEMENT (N/A) WOUND VAC CHANGE (N/A) today. The various methods of treatment have been discussed with the patient. After consideration of the risks, benefits and treatment options the patient has consented to the planned procedure.   The patient has been seen and labs reviewed. There are no changes in the patient's condition to prevent proceeding with the planned procedure today.  Blood pressure (!) 82/65, pulse 98, temperature 98.3 F (36.8 C), temperature source Oral, resp. rate 18, height '5\' 10"'$  (1.778 m), weight 83.5 kg, SpO2 91 %.   Nausea resolved Blood pressure now well controlled Satisfactory VAD performance Labs in order  Will need MAC anesthesia for VAD tunnel Wound VAC change  Recent labs:  Lab Results  Component Value Date   WBC 5.8 02/05/2023   HGB 11.6 (L) 02/05/2023   HCT 33.5 (L) 02/05/2023   PLT 245 02/05/2023   GLUCOSE 110 (H) 02/04/2023   CHOL 142 09/11/2021   TRIG 95 09/11/2021   HDL 62 09/11/2021   LDLCALC 61 09/11/2021   ALT 17 01/27/2023   AST 18 01/27/2023   NA 134 (L) 02/04/2023   K 4.0 02/04/2023   CL 98 02/04/2023   CREATININE 1.12 02/04/2023   BUN 26 (H) 02/04/2023   CO2 23 02/04/2023   TSH 11.044 (H) 01/15/2023   INR 1.9 (H) 02/04/2023   HGBA1C 8.2 (H) 05/13/2022    [name], MD 02/05/2023 8:01 AM

## 2023-02-05 NOTE — Progress Notes (Addendum)
VAD Coordinator Procedure Note:   VAD Coordinator met patient in OR. Pt undergoing wound debridement/washout and wound vac change per Dr. Prescott Gum. Hemodynamics and VAD parameters monitored by myself and anesthesia throughout the procedure. Blood pressures were obtained with automatic cuff on left arm.    Time: Doppler Auto  BP Flow PI Power Speed  Pre-procedure:  0935  85/75 (81) 3.2 10.1 4.4 5600                    Sedation Induction: 0945  (44) 2.3 LOW FLOW 3.8 4.2 5600   0948  93/74 (81) 4.5 3.8 4.2 5600   1000  120/98 (107) 3.9 5.4 3.9 5600   1010  117/102 (107) 3.9 4.9 4.2 5600  Recovery Area: 1020  117/100 (107) 4.4 4.9 4.0 5600   1030  95/58 (70) 4.5 2.9 4.2 5600   1040  106/83 (92) 4.4 3.3 4.2 5600    Patient Disposition: Patient tolerated the procedure well. VAD Coordinator accompanied and remained with patient in recovery area. Transported back to [place].  Plan for further washout next Wednesday per Dr Prescott Gum. Wound measures 5 cm long x 2 cm deep x 3 cm wide.VASHE solution and SDRM wound matrix placed in wound bed with small black sponge.   Emerson Monte RN [place] Coordinator  Office: [phone]  24/7 Pager: [phone]

## 2023-02-05 NOTE — Anesthesia Preprocedure Evaluation (Addendum)
Anesthesia Evaluation  Patient identified by MRN, date of birth, ID band Patient awake    Reviewed: Allergy & Precautions, NPO status , Patient's Chart, lab work & pertinent test results, reviewed documented beta blocker date and time   History of Anesthesia Complications Negative for: history of anesthetic complications  Airway Mallampati: II  TM Distance: >3 FB Neck ROM: Full    Dental  (+) Edentulous Upper, Edentulous Lower   Pulmonary sleep apnea , COPD, former smoker    + wheezing      Cardiovascular hypertension, + angina  + CAD, + CABG and +CHF   Rhythm:Regular Rate:Normal  Echo 06/2022  1. LVAD present at LV apex. Speed increased from 5500 to 5700. Septum  mildly shifted to the right at 5500, shifted more leftward at 5700. Speed  left at 5600 rpm at end of study. Left ventricular ejection fraction, by  estimation, is 20 to 25%. The left  ventricle has severely decreased function. The left ventricle demonstrates  global hypokinesis. There is mild left ventricular hypertrophy. Left  ventricular diastolic parameters are indeterminate.   2. Right ventricular systolic function is mildly reduced. The right  ventricular size is moderately enlarged. Tricuspid regurgitation signal is  inadequate for assessing PA pressure.   3. Left atrial size was mildly dilated.   4. Right atrial size was mildly dilated.   5. The mitral valve is normal in structure. Mild mitral valve  regurgitation. No evidence of mitral stenosis.   6. The aortic valve opened every beat at 5500 rpm, every other beat at 5700 rpm. The aortic valve is tricuspid. Aortic valve regurgitation is not visualized. No aortic stenosis is present.   7. The inferior vena cava is normal in size with greater than 50% respiratory variability, suggesting right atrial pressure of 3 mmHg.   8. LVAD ramp study.      Neuro/Psych negative neurological ROS  negative psych ROS    GI/Hepatic negative GI ROS, Neg liver ROS,,,  Endo/Other  diabetes Hyperthyroidism   Renal/GU Renal disease     Musculoskeletal  (+) Arthritis ,    Abdominal   Peds  Hematology   Anesthesia Other Findings   Reproductive/Obstetrics                             Anesthesia Physical Anesthesia Plan  ASA: 4  Anesthesia Plan: MAC   Post-op Pain Management: Ofirmev IV (intra-op)*   Induction: Intravenous  PONV Risk Score and Plan: 2 and Ondansetron, Dexamethasone, Midazolam and Treatment may vary due to age or medical condition  Airway Management Planned:   Additional Equipment:   Intra-op Plan:   Post-operative Plan:   Informed Consent: I have reviewed the patients History and Physical, chart, labs and discussed the procedure including the risks, benefits and alternatives for the proposed anesthesia with the patient or authorized representative who has indicated his/her understanding and acceptance.     Dental advisory given  Plan Discussed with: CRNA  Anesthesia Plan Comments:         Anesthesia Quick Evaluation

## 2023-02-05 NOTE — Op Note (Signed)
NAME: Kevin Underwood, MCFAUL MEDICAL RECORD NO: [id number] ACCOUNT NO: [id number] DATE OF BIRTH: 16-Mar-1961 FACILITY: [facility] LOCATION: [facility] PHYSICIAN: Ivin Poot III, MD  Operative Report   DATE OF PROCEDURE: 02/05/2023  OPERATION:  Excisional Debridement and irrigation of VAD abdominal tunnel wound, application of wound VAC, application of SDRM tissue matrix.  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  SURGEON:  Tharon Aquas Trigt III, MD  ANESTHESIA:  MAC.  INDICATION:  The patient was reviewed in preoperative holding where the informed consent was documented and the procedure was discussed again with the patient including the benefits of the surgery, the alternatives to surgery.  The use of MAC anesthesia,  and the potential risks of bleeding, pain, recurrent infection.  The patient understood and agreed to proceed.  DESCRIPTION OF PROCEDURE:  The patient was brought to the operating room by the anesthesia team as well as the VAD coordinator who accompanied the patient preoperatively, intraoperatively and in the recovery room to monitor hemodynamics, to operate the  VAD equipment, and to assist with hemodynamic management.  The patient was placed supine on the operating table.  He was stable.  The previously placed wound VAC sheaths were removed and the patient was given IV monitored sedation.  The patient maintained adequate hemodynamics.  The wound was then prepped and  draped as a sterile field.  A proper timeout was performed.  The previously placed wound VAC sponge was removed.  The wound was examined.  It was 5 cm long x 3 cm wide x 2 cm deep.  There was a mostly clean tissue without purulence.  There was no visible portion of  the pump pocket visualized.  Some of the velour off the power cord was carefully  removed.  The wound was sharply debrided of some nonviable fascia, irrigated with antibiotic irrigation as well as hypochlorous wound solution.  9 cm sheet of SDRM wound matrix was then cut in appropriate  sizes and shape to cover the surface of the abdominal wall wound.  An adhesive mesh, Rylon, was then applied over the matrix and over that a wound VAC small sponge was cut to the appropriate configuration and placed over the wound.  The sterile sheets  were placed after the skin was painted with adhesive.  The wound suction catheter was placed into an opening in the sheet over the sponge and that connected to the wound pump.  The wound pump was secured and the wound VAC functioning well without  evidence of air leak.  The patient had regained consciousness and that was then transported back to recovery room in stable condition.   PUS D: 02/05/2023 10:39:47 am T: 02/05/2023 10:58:00 am  JOB: [id number]

## 2023-02-05 NOTE — Plan of Care (Signed)
  Problem: Education: Goal: Patient will understand all VAD equipment and how it functions Outcome: Progressing Goal: Patient will be able to verbalize current INR target range and antiplatelet therapy for discharge home Outcome: Progressing   Problem: Cardiac: Goal: LVAD will function as expected and patient will experience no clinical alarms Outcome: Progressing   Problem: Education: Goal: Knowledge of General Education information will improve Description: Including pain rating scale, medication(s)/side effects and non-pharmacologic comfort measures Outcome: Progressing   Problem: Health Behavior/Discharge Planning: Goal: Ability to manage health-related needs will improve Outcome: Progressing   Problem: Clinical Measurements: Goal: Ability to maintain clinical measurements within normal limits will improve Outcome: Progressing Goal: Will remain free from infection Outcome: Progressing Goal: Diagnostic test results will improve Outcome: Progressing Goal: Respiratory complications will improve Outcome: Progressing Goal: Cardiovascular complication will be avoided Outcome: Progressing   Problem: Activity: Goal: Risk for activity intolerance will decrease Outcome: Progressing   Problem: Nutrition: Goal: Adequate nutrition will be maintained Outcome: Progressing   Problem: Coping: Goal: Level of anxiety will decrease Outcome: Progressing   Problem: Elimination: Goal: Will not experience complications related to bowel motility Outcome: Progressing Goal: Will not experience complications related to urinary retention Outcome: Progressing   Problem: Pain Managment: Goal: General experience of comfort will improve Outcome: Progressing   Problem: Safety: Goal: Ability to remain free from injury will improve Outcome: Progressing   

## 2023-02-05 NOTE — Progress Notes (Addendum)
Patient ID: Tawni Millers., male   DOB: [birth year], 62 y.o.   MRN: [id number]   Advanced Heart Failure VAD Team Note  PCP-Cardiologist: Rozann Lesches, MD  AHF: Dr. 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 On IV vancomycin.  OR for debridement w/ wound vac placement 2/20. Plan to return to OR for washout today  Vanc switched to daptomycin 2/27 d/t uncontrolled nausea  Remains on warfarin, INR pending (labs drawn late this morning). Hep gtt and ASA stopped yesterday 2/2 OR today  VAD MAPS in 140s yesterday. Suspect 2/2 withdrawal. Improved with ativan.   This morning feels much better. No longer dry heaving. Got good rest yesterday. Denies CP.   LVAD INTERROGATION:  HeartMate III LVAD:   Flow 4.3 liters/min, speed 5600, power 4.2, PI 3.5 .  128 PI events.  Objective:    Vital Signs:   Temp:  [97.6 F (36.4 C)-98.3 F (36.8 C)] 98.3 F (36.8 C) (02/28 0500) Pulse Rate:  [83-103] 85 (02/28 0500) Resp:  [17-23] 17 (02/28 0500) BP: (83-162)/(59-107) 83/59 (02/28 0500) SpO2:  [95 %-100 %] 99 % (02/28 0500) Weight:  [83.5 kg] 83.5 kg (02/28 0500) Last BM Date : 02/02/23 Mean arterial Pressure 70s-90s  Intake/Output:   Intake/Output Summary (Last 24 hours) at 02/05/2023 0709 Last data filed at 02/05/2023 0555 Gross per 24 hour  Intake 0 ml  Output 1450 ml  Net -1450 ml    Physical Exam  General:  Well appearing. No resp difficulty HEENT: Normal Neck: supple. No JVP . Carotids 2+ bilat; no bruits. No lymphadenopathy or thyromegaly appreciated. Cor: Mechanical heart sounds with LVAD hum present. Lungs: Clear Abdomen: soft, nontender, nondistended. No hepatosplenomegaly. No bruits or masses. Good bowel sounds. Driveline: C/D/I; securement device intact. Wound vac in place.  Extremities: no cyanosis, clubbing, rash, edema Neuro: alert & orientedx3, cranial nerves grossly intact. moves all 4 extremities w/o difficulty. Affect pleasant   Telemetry   NSR 90s, intermittent NSVT (Personally reviewed)    EKG   No new EKG  Labs   Basic Metabolic Panel: Recent Labs  Lab 01/30/23 2242 01/31/23 0030 02/01/23 0032 02/02/23 0026 02/03/23 0600 02/04/23 0017  NA 132* 133* 132* 131* 133* 134*  K 3.9 4.1 3.7 3.9 3.8 4.0  CL 93* 93* 95* 97* 97* 98  CO2 '25 27 26 25 24 23  '$ GLUCOSE 145* 161* 156* 155* 141* 110*  BUN 15 14 26* 35* 25* 26*  CREATININE 1.13 1.21 1.31* 1.69* 1.08 1.12  CALCIUM 9.0 9.1 8.5* 8.1* 8.4* 8.5*  MG 1.9  --   --   --   --  1.8   Liver Function Tests: No results for input(s): "AST", "ALT", "ALKPHOS", "BILITOT", "PROT", "ALBUMIN" in the last 168 hours.  CBC: Recent Labs  Lab 02/01/23 0032 02/02/23 0026 02/03/23 0021 02/03/23 2026 02/04/23 0017  WBC 6.1 5.0 5.7 5.7 5.8  HGB 11.4* 11.1* 11.2* 11.8* 11.3*  HCT 33.2* 31.7* 33.1* 35.3* 32.8*  MCV 84.5 82.8 82.8 84.7 82.4  PLT 197 184 192 156 208   INR: Recent Labs  Lab 01/31/23 0030 02/01/23 0032 02/02/23 0026 02/03/23 0021 02/04/23 0017  INR 1.1 1.2 1.2 1.4* 1.9*    Other results: EKG:   Imaging   DG Abd 1 View  Result Date: 02/04/2023 CLINICAL DATA:  Constipation EXAM: ABDOMEN - 1 VIEW COMPARISON:  07/12/2022 FINDINGS: Normal abdominal gas pattern. Right flank is excluded from view. Small stool burden.  No free intraperitoneal gas. Left ventricular assist device noted. Lumbosacral fusion with instrumentation again noted. IMPRESSION: 1. Nonobstructive bowel gas pattern. Small stool burden. Electronically Signed   By: Fidela Salisbury M.D.   On: 02/04/2023 18:21    Medications:     Scheduled Medications:  allopurinol  100 mg Oral Daily   amiodarone  200 mg Oral BID   feeding supplement (GLUCERNA SHAKE)  237 mL Oral TID WC   glipiZIDE  5 mg Oral Q breakfast   guaiFENesin  600 mg Oral BID   insulin aspart  0-24 Units Subcutaneous TID WC   insulin aspart  0-5 Units Subcutaneous QHS   loratadine  10 mg Oral Daily   metFORMIN  500  mg Oral BID WC   metoprolol succinate  25 mg Oral Daily   minocycline  200 mg Oral BID   multivitamin with minerals  1 tablet Oral Q24H   pantoprazole  40 mg Oral Daily   sertraline  50 mg Oral Daily   tamsulosin  0.4 mg Oral Daily   thiamine (VITAMIN B1) injection  100 mg Intravenous Daily   traZODone  150 mg Oral QHS   Warfarin - Pharmacist Dosing Inpatient   Does not apply q1600   zinc sulfate  220 mg Oral Daily    Infusions:  DAPTOmycin (CUBICIN) 700 mg in sodium chloride 0.9 % IVPB 700 mg (02/04/23 1319)    PRN Medications: acetaminophen, colchicine, fluticasone, hydrALAZINE, ipratropium, levalbuterol, ondansetron (ZOFRAN) IV, oxyCODONE   Patient Profile  62 y.o. 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 type 2 diabetes mellitus, COPD, hypertension, and HMIII VAD.    Admitted with recurrent LVAD complication driveline infection + suspected AECOPD Assessment/Plan:   1. LVAD Complication---> Recurrent driveline infection: MRSA driveline infection, admitted in 6/23 and again in 7/23-8/23 with multiple trips to the OR for debridement. He had completed vancomycin and then long-term doxycycline. Most recent culture PTA grew acinetobacter and MRSA. ID started minocycline. He had been off minocycline for at least 3 days prior to this admit w/ copious drainage around driveline. CT chest/abd/pelvis showed stranding around the driveline and small amount of fluid. Started on Vanc and Meropenum per ID. Now off meropenem (2/21).  OR for debridement w/ wound vac placement 2/20. Wound culture with MRSA, blood cultures negative.  Afebrile.  - Will need 6 wks abx. Vanc switched to dapto 2/27 with nausea. Then back to minocycline alone.   - Plan to return to OR for washout/vac change 2/28. Keep INR 1.8-2.2 until then.  2. Chronic HFrEF:  Echo 10/21 with EF 20-25%, mildly decreased RV function. LHC/RHC in 12/21 with patent grafts, low output. Suspect mixed ischemic/nonischemic cardiomyopathy (prior heavy ETOH and drugs as well as CAD).  No ETOH, drugs, smoking since CABG in 12/20. Admitted with cardiogenic shock in 12/21, had placement of Impella 5.5 initially, now s/p Heartmate 3 LVAD on 11/17/20.  Ramp echo 2/23 with speed decreased to 5500 rpm. Volume status stable. Now off entresto due to lower Maps and elevated creatinine.  Does not need lasix.  - LDH pending - Warfarin restarted, goal INR 1.8-2.2 until surgery. Now off hep gtt and ASA. INR pending  - Not a candidate for transplant with ongoing drug use.  3. HTN: MAPs elevated low 100s on admit, poor compliance with meds. Has fluctuated significantly while admitted. MAP 70s-90s -  Can continue Toprol XL 25 mg daily  - Entresto stopped due to lower Maps.   - BP more stable on hydralazine PRN although 1 mg ativan seemed to help the most 4. Polysubstance Abuse: Recently started using methamphetamine again.  UDS + for amphetamines.  - SW team following. Unfortunately, limited resources for inpatient substance use due LVAD - suspect withdrawal from meth. May need full CIWA. Will continue to monitor.  5. PAF: S/p Maze and LA appendage clip with CABG in 12/20.  NSR this admission.  6. 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  Still some wheezing but has improved.   - Continue scheduled atrovent.  - Levalbuterol prn.  - Have avoided prednisone with surgery at driveline site for recurrent infection.   7. DMII - SSI  8.  NSVT/PVCs: Frequent NSVT 2/23, may have been dry after diuresis.  Amiodarone gtt started to suppress.  - Continue po amiodarone 200 mg bid.  - Would eventually stop this medication, avoid with COPD.    Patient refusing to work with PT, not motivated and expresses frustration with his overall situation.  Ongoing drug use, no longer living with his wife.  Has been living with his  mother who cannot care for him.  He will need SNF for IV antibiotics at discharge.  TOC Team working to find SNF.    I reviewed the LVAD parameters from today, and compared the results to the patient's prior recorded data.  No programming changes were made.  The LVAD is functioning within specified parameters.  The patient performs LVAD self-test daily.  LVAD interrogation was negative for any significant power changes, alarms or PI events/speed drops.  LVAD equipment check completed and is in good working order.  Back-up equipment present.   LVAD education done on emergency procedures and precautions and reviewed exit site care.  Length of Stay: [place], NP 02/05/2023, 7:09 AM  VAD Team --- VAD ISSUES ONLY--- Pager [phone] (7am - 7am)  Advanced Heart Failure Team  Pager [phone] (M-F; 7a - 5p)  Please contact [facility] for night-coverage after hours (5p -7a ) and weekends on [email or web address]  Patient seen and examined with the above-signed Advanced Practice Provider and/or Housestaff. I personally reviewed laboratory data, imaging studies and relevant notes. I independently examined the patient and formulated the important aspects of the plan. I have edited the note to reflect any of my changes or salient points. I have personally discussed the plan with the patient and/or family.  Feels better today after 1 dose of ativan and switching from vanc to dapto. Nausea resolved. HTN resolved. No fevers or chills. Wound vac working well. INR 2.2. Off heparin.   General:  NAD.  HEENT: normal  Neck: supple. JVP not elevated.  Carotids 2+ bilat; no bruits. No lymphadenopathy or thryomegaly appreciated. Cor: LVAD hum.  Lungs: Clear. Abdomen: obese soft, nontender, non-distended. No hepatosplenomegaly. No bruits or masses. Good bowel sounds. Driveline site with wound vac Anchor in place.  Extremities: no cyanosis, clubbing, rash. Warm no edema  Neuro: alert & oriented x 3. No focal deficits.  Moves all 4 without problem   Agitation and nausea improved. Initial concern was for ETOH withdrawal but also felt to be related to vancomycin. Now much better. INR 2.2 off heparin. Wound site doing well. Continue vac changes as needed.   VAD interrogated personally. Parameters stable.   Glori Bickers, MD  5:39 PM

## 2023-02-05 NOTE — Progress Notes (Signed)
Mobility Specialist Progress Note    02/05/23 1505  Mobility  Activity Ambulated with assistance in hallway  Level of Assistance Minimal assist, patient does 75% or more  Assistive Device Other (Comment) (HHA)  Distance Ambulated (ft) 420 ft  Activity Response Tolerated well  Mobility Referral Yes  $Mobility charge 1 Mobility   Pt received in bed and agreeable. No complaints. Pt did stumble x3 requiring minA to recover. Returned to sitting EOB w/ call bell in reach.   Hildred Alamin Mobility Specialist  Please Psychologist, sport and exercise or Rehab Office at [phone]

## 2023-02-06 DIAGNOSIS — T829XXD Unspecified complication of cardiac and vascular prosthetic device, implant and graft, subsequent encounter: Secondary | ICD-10-CM | POA: Diagnosis not present

## 2023-02-06 LAB — GLUCOSE, CAPILLARY
Glucose-Capillary: 162 mg/dL — ABNORMAL HIGH (ref 70–99)
Glucose-Capillary: 191 mg/dL — ABNORMAL HIGH (ref 70–99)
Glucose-Capillary: 162 mg/dL — ABNORMAL HIGH (ref 70–99)
Glucose-Capillary: 122 mg/dL — ABNORMAL HIGH (ref 70–99)

## 2023-02-06 LAB — PROTIME-INR
INR: 2.4 — ABNORMAL HIGH (ref 0.8–1.2)
Prothrombin Time: 26 seconds — ABNORMAL HIGH (ref 11.4–15.2)

## 2023-02-06 LAB — CBC
HCT: 29.7 % — ABNORMAL LOW (ref 39.0–52.0)
Hemoglobin: 10.5 g/dL — ABNORMAL LOW (ref 13.0–17.0)
MCH: 28.6 pg (ref 26.0–34.0)
MCHC: 35.4 g/dL (ref 30.0–36.0)
MCV: 80.9 fL (ref 80.0–100.0)
Platelets: 203 10*3/uL (ref 150–400)
RBC: 3.67 MIL/uL — ABNORMAL LOW (ref 4.22–5.81)
RDW: 13.5 % (ref 11.5–15.5)
WBC: 6.2 10*3/uL (ref 4.0–10.5)
nRBC: 0 % (ref 0.0–0.2)

## 2023-02-06 LAB — BASIC METABOLIC PANEL
Anion gap: 7 (ref 5–15)
BUN: 29 mg/dL — ABNORMAL HIGH (ref 8–23)
CO2: 26 mmol/L (ref 22–32)
Calcium: 8.3 mg/dL — ABNORMAL LOW (ref 8.9–10.3)
Chloride: 101 mmol/L (ref 98–111)
Creatinine, Ser: 1.47 mg/dL — ABNORMAL HIGH (ref 0.61–1.24)
GFR, Estimated: 54 mL/min — ABNORMAL LOW (ref >60)
Glucose, Bld: 167 mg/dL — ABNORMAL HIGH (ref 70–99)
Potassium: 3.5 mmol/L (ref 3.5–5.1)
Sodium: 134 mmol/L — ABNORMAL LOW (ref 135–145)

## 2023-02-06 LAB — LACTATE DEHYDROGENASE: LDH: 152 U/L (ref 98–192)

## 2023-02-06 MED ORDER — CHLORDIAZEPOXIDE HCL 5 MG PO CAPS
25.0000 mg | ORAL_CAPSULE | ORAL | Status: AC
  Administered 2023-02-08 (×2): 25 mg via ORAL
  Filled 2023-02-06 (×2): qty 5

## 2023-02-06 MED ORDER — CHLORDIAZEPOXIDE HCL 5 MG PO CAPS
25.0000 mg | ORAL_CAPSULE | Freq: Every day | ORAL | Status: AC
  Administered 2023-02-09: 25 mg via ORAL
  Filled 2023-02-06: qty 5

## 2023-02-06 MED ORDER — LORAZEPAM 2 MG/ML IJ SOLN
INTRAMUSCULAR | Status: AC
  Filled 2023-02-06: qty 1

## 2023-02-06 MED ORDER — CHLORDIAZEPOXIDE HCL 5 MG PO CAPS
25.0000 mg | ORAL_CAPSULE | Freq: Three times a day (TID) | ORAL | Status: AC
  Administered 2023-02-07 (×3): 25 mg via ORAL
  Filled 2023-02-06 (×3): qty 5

## 2023-02-06 MED ORDER — LORAZEPAM 2 MG/ML IJ SOLN
1.0000 mg | Freq: Once | INTRAMUSCULAR | Status: AC
  Administered 2023-02-06: 1 mg via INTRAVENOUS
  Filled 2023-02-06: qty 1

## 2023-02-06 MED ORDER — LOPERAMIDE HCL 2 MG PO CAPS: 2.0000 mg | ORAL_CAPSULE | ORAL | Status: AC | PRN

## 2023-02-06 MED ORDER — CHLORDIAZEPOXIDE HCL 5 MG PO CAPS: 25.0000 mg | ORAL_CAPSULE | Freq: Four times a day (QID) | ORAL | Status: AC | PRN

## 2023-02-06 MED ORDER — THIAMINE MONONITRATE 100 MG PO TABS
100.0000 mg | ORAL_TABLET | Freq: Every day | ORAL | Status: DC
  Administered 2023-02-07 – 2023-02-20 (×14): 100 mg via ORAL
  Filled 2023-02-06 (×14): qty 1

## 2023-02-06 MED ORDER — HYDROXYZINE HCL 25 MG PO TABS
25.0000 mg | ORAL_TABLET | Freq: Four times a day (QID) | ORAL | Status: AC | PRN
  Administered 2023-02-06 – 2023-02-09 (×3): 25 mg via ORAL
  Filled 2023-02-06 (×3): qty 1

## 2023-02-06 MED ORDER — CHLORDIAZEPOXIDE HCL 5 MG PO CAPS
25.0000 mg | ORAL_CAPSULE | Freq: Four times a day (QID) | ORAL | Status: AC
  Administered 2023-02-06 (×4): 25 mg via ORAL
  Filled 2023-02-06 (×4): qty 5

## 2023-02-06 MED ORDER — BACITRACIN-NEOMYCIN-POLYMYXIN OINTMENT TUBE
TOPICAL_OINTMENT | Freq: Every day | CUTANEOUS | Status: AC
  Administered 2023-02-06: 1 via TOPICAL
  Filled 2023-02-06: qty 14

## 2023-02-06 NOTE — Progress Notes (Addendum)
Patient ID: Kevin Millers., male   DOB: [birth year], 62 y.o.   MRN: [id number]   Advanced Heart Failure VAD Team Note  PCP-Cardiologist: Rozann Lesches, MD  AHF: Dr. 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 On IV vancomycin.  OR for debridement w/ wound vac placement 2/20 and 2/28.  Vanc switched to daptomycin 2/27 d/t uncontrolled nausea  Remains on warfarin, INR 2.4. Hep gtt and ASA stopped 2/27. Returned to OR 2/28 for washout and wound vac, plan for same again 3/6.  Feels ok this morning. Had a good day yesterday, no nausea, able to ambulate. This morning nausea has returned. Denies CP.  LVAD INTERROGATION:  HeartMate III LVAD:   Flow 3.8 liters/min, speed 5600, power 4.2, PI 6 .  93 PI events.  Objective:    Vital Signs:   Temp:  [98 F (36.7 C)-98.5 F (36.9 C)] 98 F (36.7 C) (02/29 0455) Pulse Rate:  [50-98] 82 (02/29 0455) Resp:  [14-20] 20 (02/29 0455) BP: (80-141)/(39-100) 141/99 (02/29 0549) SpO2:  [91 %-100 %] 98 % (02/29 0455) Weight:  [83.5 kg-86 kg] 86 kg (02/29 0455) Last BM Date : 02/02/23 Mean arterial Pressure 70s-90s  Intake/Output:   Intake/Output Summary (Last 24 hours) at 02/06/2023 0706 Last data filed at 02/05/2023 2335 Gross per 24 hour  Intake 830 ml  Output 975 ml  Net -145 ml    Physical Exam  General:  Well appearing. No resp difficulty HEENT: Normal Neck: supple. No JVP. Carotids 2+ bilat; no bruits. No lymphadenopathy or thyromegaly appreciated. Cor: Mechanical heart sounds with LVAD hum present. Lungs: course bases Abdomen: soft, nontender, nondistended. No hepatosplenomegaly. No bruits or masses. Good bowel sounds. Driveline: C/D/I; securement device intact. + wound vac with good suction.  Extremities: no cyanosis, clubbing, rash, edema Neuro: alert & orientedx3, cranial nerves grossly intact. moves all 4 extremities w/o difficulty. Affect pleasant  Telemetry   NSR 90s (Personally reviewed)     EKG   No new EKG  Labs   Basic Metabolic Panel: Recent Labs  Lab 01/30/23 2242 01/31/23 0030 02/02/23 0026 02/03/23 0600 02/04/23 0017 02/05/23 0701 02/06/23 0017  NA 132*   < > 131* 133* 134* 135 134*  K 3.9   < > 3.9 3.8 4.0 3.7 3.5  CL 93*   < > 97* 97* 98 99 101  CO2 25   < > '25 24 23 24 26  '$ GLUCOSE 145*   < > 155* 141* 110* 120* 167*  BUN 15   < > 35* 25* 26* 16 29*  CREATININE 1.13   < > 1.69* 1.08 1.12 1.15 1.47*  CALCIUM 9.0   < > 8.1* 8.4* 8.5* 8.8* 8.3*  MG 1.9  --   --   --  1.8  --   --    < > = values in this interval not displayed.   Liver Function Tests: No results for input(s): "AST", "ALT", "ALKPHOS", "BILITOT", "PROT", "ALBUMIN" in the last 168 hours.  CBC: Recent Labs  Lab 02/03/23 0021 02/03/23 2026 02/04/23 0017 02/05/23 0701 02/06/23 0017  WBC 5.7 5.7 5.8 5.8 6.2  HGB 11.2* 11.8* 11.3* 11.6* 10.5*  HCT 33.1* 35.3* 32.8* 33.5* 29.7*  MCV 82.8 84.7 82.4 81.7 80.9  PLT 192 156 208 245 203   INR: Recent Labs  Lab 02/02/23 0026 02/03/23 0021 02/04/23 0017 02/05/23 0701 02/06/23 0017  INR 1.2 1.4* 1.9* 2.2* 2.4*    Other  results: EKG:   Imaging   DG Abd 1 View  Result Date: 02/04/2023 CLINICAL DATA:  Constipation EXAM: ABDOMEN - 1 VIEW COMPARISON:  07/12/2022 FINDINGS: Normal abdominal gas pattern. Right flank is excluded from view. Small stool burden. No free intraperitoneal gas. Left ventricular assist device noted. Lumbosacral fusion with instrumentation again noted. IMPRESSION: 1. Nonobstructive bowel gas pattern. Small stool burden. Electronically Signed   By: Fidela Salisbury M.D.   On: 02/04/2023 18:21    Medications:     Scheduled Medications:  allopurinol  100 mg Oral Daily   amiodarone  200 mg Oral BID   feeding supplement (GLUCERNA SHAKE)  237 mL Oral TID WC   glipiZIDE  5 mg Oral Q breakfast   guaiFENesin  600 mg Oral BID   insulin aspart  0-24 Units Subcutaneous TID WC   insulin aspart  0-5 Units Subcutaneous QHS    loratadine  10 mg Oral Daily   metFORMIN  500 mg Oral BID WC   metoprolol succinate  25 mg Oral Daily   minocycline  200 mg Oral BID   multivitamin with minerals  1 tablet Oral Q24H   pantoprazole  40 mg Oral Daily   sertraline  50 mg Oral Daily   tamsulosin  0.4 mg Oral Daily   thiamine (VITAMIN B1) injection  100 mg Intravenous Daily   traZODone  150 mg Oral QHS   Warfarin - Pharmacist Dosing Inpatient   Does not apply q1600   zinc sulfate  220 mg Oral Daily    Infusions:  DAPTOmycin (CUBICIN) 700 mg in sodium chloride 0.9 % IVPB 700 mg (02/05/23 2043)    PRN Medications: acetaminophen, colchicine, fluticasone, hydrALAZINE, ipratropium, levalbuterol, ondansetron (ZOFRAN) IV, mouth rinse, oxyCODONE  Patient Profile  62 y.o. 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 type 2 diabetes mellitus, COPD, hypertension, and HMIII VAD.    Admitted with recurrent LVAD complication driveline infection + suspected AECOPD Assessment/Plan:   1. LVAD Complication---> Recurrent driveline infection: MRSA driveline infection, admitted in 6/23 and again in 7/23-8/23 with multiple trips to the OR for debridement. He had completed vancomycin and then long-term doxycycline. Most recent culture PTA grew acinetobacter and MRSA. ID started minocycline. He had been off minocycline for at least 3 days prior to this admit w/ copious drainage around driveline. CT chest/abd/pelvis showed stranding around the driveline and small amount of fluid. Started on Vanc and Meropenum per ID. Now off meropenem (2/21).  OR for debridement w/ wound vac placement 2/20. Wound culture with MRSA, blood cultures negative.  Afebrile.  - Will need 6 wks abx. Vanc switched to dapto 2/27 with nausea. Then back to minocycline alone.   - Plan to return to OR for washout/vac  change 3/6. Keep INR 1.8-2.2 until then.  2. Chronic HFrEF: Echo 10/21 with EF 20-25%, mildly decreased RV function. LHC/RHC in 12/21 with patent grafts, low output. Suspect mixed ischemic/nonischemic cardiomyopathy (prior heavy ETOH and drugs as well as CAD).  No ETOH, drugs, smoking since CABG in 12/20. Admitted with cardiogenic shock in 12/21, had placement of Impella 5.5 initially, now s/p Heartmate 3 LVAD on 11/17/20.  Ramp echo 2/23 with speed decreased to 5500 rpm. Volume status stable. Now off entresto due to lower Maps and elevated creatinine.  Does not need lasix.  - LDH 152 - Warfarin restarted, goal INR  1.8-2.2 until surgery. Now off hep gtt and ASA. INR 2.4, dosing per pharmD  - Not a candidate for transplant with ongoing drug use.  3. HTN: MAPs elevated low 100s on admit, poor compliance with meds. Has fluctuated significantly while admitted. MAP 70s-90s - Can continue Toprol XL 25 mg daily  - Entresto stopped due to lower Maps.   - BP more stable on hydralazine PRN although 1 mg ativan seemed to help the most 4. Polysubstance Abuse: Recently started using methamphetamine again.  UDS + for amphetamines.  - SW team following. Unfortunately, limited resources for inpatient substance use due LVAD - suspect withdrawal from meth. May need full CIWA. Will continue to monitor.  5. PAF: S/p Maze and LA appendage clip with CABG in 12/20.  NSR this admission.  6. 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  Still some wheezing but has improved.   - Continue scheduled atrovent.  - Levalbuterol prn.  - Have avoided prednisone with surgery at driveline site for recurrent infection.   7. DMII - SSI  8.  NSVT/PVCs: Frequent NSVT 2/23, may have been dry after diuresis.  Amiodarone gtt started to suppress.  - Continue po amiodarone 200 mg bid.  - Would eventually stop this medication, avoid with COPD.   9. Nausea - Began 2/27. Assumed 2/2 vancomycin initially however vanc switched to daptomycin and nausea  continues.  - suspect etiology 2/2 withdrawal. - resolved yesterday after ativan x1 - will do 1 mg ativan. May need full CIWA?  Patient refusing to work with PT, not motivated and expresses frustration with his overall situation.  Ongoing drug use, no longer living with his wife.  Has been living with his mother who cannot care for him.  He will need SNF for IV antibiotics at discharge.  TOC Team working to find SNF.    I reviewed the LVAD parameters from today, and compared the results to the patient's prior recorded data.  No programming changes were made.  The LVAD is functioning within specified parameters.  The patient performs LVAD self-test daily.  LVAD interrogation was negative for any significant power changes, alarms or PI events/speed drops.  LVAD equipment check completed and is in good working order.  Back-up equipment present.   LVAD education done on emergency procedures and precautions and reviewed exit site care.  Length of Stay: [place], NP 02/06/2023, 7:06 AM  VAD Team --- VAD ISSUES ONLY--- Pager [phone] (7am - 7am)  Advanced Heart Failure Team  Pager [phone] (M-F; 7a - 5p)  Please contact [facility] for night-coverage after hours (5p -7a ) and weekends on [email or web address]  Patient seen and examined with the above-signed Advanced Practice Provider and/or Housestaff. I personally reviewed laboratory data, imaging studies and relevant notes. I independently examined the patient and formulated the important aspects of the plan. I have edited the note to reflect any of my changes or salient points. I have personally discussed the plan with the patient and/or family.  He is nauseated again. Feels weak. MAPs back up. Denies f/c or SOB  General:  Lying in bed. Weak appearing NAD.  HEENT: normal  Neck: supple. JVP not elevated.  Carotids 2+ bilat; no bruits. No lymphadenopathy or thryomegaly appreciated. Cor: LVAD hum.  Lungs: Clear. Abdomen: obese soft, nontender,  non-distended. No hepatosplenomegaly. No bruits or masses. Good bowel sounds. Wound vac site ok Anchor in place.  Extremities: no cyanosis, clubbing, rash. Warm no edema  Neuro: alert & oriented x 3. No  focal deficits. Moves all 4 without problem   Suspect he is having withdrawal symptoms. Will start CIWA program. Continue IV abx. INR 2.4 Discussed dosing with PharmD personally. Use hydralazine prn for elevated MAPs.    Glori Bickers, MD  1:44 PM

## 2023-02-06 NOTE — TOC Progression Note (Addendum)
Transition of Care (TOC) - Progression Note    Patient Details  Name: Kevin Underwood. MRN: [id number] Date of Birth: 09/08/1961  Transition of Care [facility]) CM/SW Contact  Erenest Rasher, RN Phone Number: [phone] 02/06/2023, 11:55 AM  Clinical Narrative:     CM spoke to [facility] Admission Coordinator, Jackelyn Poling. States when referral go in the hub they automatically go to central intake. Requested CM fax referral to her Attn # [phone]. She will review and send to DON. They manage pt with LVAD at their facility. Will fax clinicals, FL2 to [place].   Contacted [facility] in [place] and states she will have to review and send to DON for review.    Expected Discharge Plan: [place] Barriers to Discharge: Continued Medical Work up  Expected Discharge Plan and Services   Discharge Planning Services: CM Consult Post Acute Care Choice: [place] arrangements for the past 2 months: [place]: RN           Social Determinants of Health (SDOH) Interventions SDOH Screenings   Food Insecurity: No Food Insecurity (02/05/2023)  Housing: Low Risk  (02/05/2023)  Transportation Needs: Unmet Transportation Needs (02/05/2023)  Utilities: Not At Risk (02/05/2023)  Alcohol Screen: Low Risk  (11/10/2020)  Depression (PHQ2-9): Low Risk  (10/16/2022)  Financial Resource Strain: Medium Risk (05/17/2022)  Physical Activity: Inactive (11/10/2020)  Tobacco Use: Medium Risk (02/05/2023)    Readmission Risk Interventions    07/01/2022    9:05 AM 11/28/2020   11:57 AM  Readmission Risk Prevention Plan  Transportation Screening Complete Complete  Medication Review Press photographer) Complete Complete  PCP or Specialist appointment within 3-5 days of discharge Complete   HRI or [place] Complete Complete  SW Recovery Care/Counseling Consult Complete Complete  Palliative Care Screening Not  Applicable Not [place] Not Applicable Not Applicable

## 2023-02-06 NOTE — Progress Notes (Signed)
ANTICOAGULATION CONSULT NOTE  Pharmacy Consult for Heparin > warfarin  Indication: LVAD  Allergies  Allergen Reactions   Vancomycin Nausea And Vomiting and Other (See Comments)    Severe joint pain    Patient Measurements: Height: '5\' 10"'$  (177.8 cm) Weight: 86 kg (189 lb 9.5 oz) IBW/kg (Calculated) : 73   Vital Signs: Temp: 97.5 F (36.4 C) (02/29 0700) Temp Source: Oral (02/29 0700) BP: 136/98 (02/29 0700) Pulse Rate: 82 (02/29 0455)  Labs: Recent Labs    02/04/23 0017 02/04/23 1101 02/05/23 0701 02/06/23 0017  HGB 11.3*  --  11.6* 10.5*  HCT 32.8*  --  33.5* 29.7*  PLT 208  --  245 203  LABPROT 21.9*  --  24.1* 26.0*  INR 1.9*  --  2.2* 2.4*  HEPARINUNFRC <0.10*  --   --   --   CREATININE 1.12  --  1.15 1.47*  CKTOTAL  --  62  --   --      Estimated Creatinine Clearance: 53.8 mL/min (A) (by C-G formula based on SCr of 1.47 mg/dL (H)).   Medical History: Past Medical History:  Diagnosis Date   "    Arthritis    CAD (coronary artery disease)    a. s/p CABG in 11/2019 with LIMA-LAD, SVG-OM1, SVG-PDA and SVG-D1   CHF (congestive heart failure) (HCC)    a. EF < 20% by echo in 11/2019   Essential hypertension    PAF (paroxysmal atrial fibrillation) (HCC)    Type 2 diabetes mellitus (HCC)     Medications:   DAPTOmycin (CUBICIN) 700 mg in sodium chloride 0.9 % IVPB 700 mg (02/05/23 2043)    Assessment: 62 yo male on chronic Coumadin for LVAD, poor compliance with outpatient checks.  INR on admission 1.1. S/p OR 2/20 for drive line infection debridement.   Previous Coumadin dose 6 mg daily except 4 mg on Tues, Thurs.  INR today increased from 2.2 to 2.4. Hgb 10.5, plt 203. LDH stable at 152. No s/sx of bleeding. Underwent I&D with wound VAC change 2/28 - next plan for 3/6.   Goal of Therapy:  INR 2-2.5 >>  aim closer to 2-2.3 for now Monitor platelets by anticoagulation protocol: Yes   Plan:  Hold warfarin for tonight Daily CBC, heparin level and  INR  Antonietta Jewel, PharmD, BCCCP Clinical Pharmacist  Phone: [phone] 02/06/2023 7:46 AM  Please check AMION for all [place] phone numbers After 10:00 PM, call [place] [phone]

## 2023-02-06 NOTE — NC FL2 (Signed)
[place] LEVEL OF CARE FORM     IDENTIFICATION  Patient Name: Kevin Underwood. Birthdate: February 24, 1961 Sex: male Admission Date (Current Location): 01/27/2023  [facility] and [place] Number:  Herbalist and Address:  The . [facility], [place] [address], [place], [place] [address]      Provider Number: [id number]  Attending Physician Name and Address:  Larey Dresser, MD  Relative Name and Phone Number:  Kimble Umansky,  [phone]    Current Level of Care: Hospital Recommended Level of Care: [place] Prior Approval Number:    Date Approved/Denied:   PASRR Number: [id number]  Discharge Plan: SNF    Current Diagnoses: Patient Active Problem List   Diagnosis Date Noted   Pain and swelling of right wrist 09/30/2022   Hypotension 08/07/2022   MRSA infection    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 (LVAD) ([place])    Left ventricular assist device (LVAD) complication, subsequent encounter 05/13/2022   AKI (acute kidney injury) ([place]) 05/13/2022   Hyperthyroidism [id number]   Chronic systolic heart failure (HCC)    COPD (chronic obstructive pulmonary disease) ([place]) 09/09/2021   Malnutrition of moderate degree 11/17/2020   CHF (congestive heart failure), NYHA class IV, chronic, systolic ([place]) [id number]   Palliative care by specialist    Goals of care, counseling/discussion    Hyperlipidemia    CHF exacerbation ([place]) 09/23/2020   Unstable angina ([place]) 09/22/2020   LVAD (left ventricular assist device) present ([place])    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   Cardiogenic shock ([place])    Acute respiratory failure (HCC)    RUQ abdominal pain    Cardiomyopathy ([place]) [id number]   Acute systolic CHF (congestive heart failure) ([place]) 11/21/2019   History of alcohol abuse Quit 6 mos ago 11/21/2019   History of recreational drug use 11/21/2019   PAF (paroxysmal atrial fibrillation) ([place]) 11/21/2019    Cardiac volume overload 11/21/2019   Elevated brain natriuretic peptide (BNP) level 11/21/2019   Leg edema, right 11/21/2019   Hyponatremia 11/21/2019   Abnormal electrocardiogram (ECG) (EKG) 11/21/2019   Hyperglycemia 11/21/2019   Tremulousness 11/21/2019   Restlessness and agitation 11/21/2019   OSA (obstructive sleep apnea) 11/21/2019   Atrial fibrillation with RVR (HCC) 11/20/2019   Acute congestive heart failure ([place]) 11/20/2019   Acute respiratory distress 11/20/2019   COPD with acute exacerbation (HCC)    Hypertension    Type 2 diabetes mellitus (HCC)    Closed fracture of 5th metacarpal 08/31/2012    Orientation RESPIRATION BLADDER Height & Weight     Self, Time, Situation, Place  Normal Continent Weight: 189 lb 9.5 oz (86 kg) Height:  '5\' 10"'$  (177.8 cm)  BEHAVIORAL SYMPTOMS/MOOD NEUROLOGICAL BOWEL NUTRITION STATUS      Continent Diet (See DC summary)  AMBULATORY STATUS COMMUNICATION OF NEEDS Skin   Supervision Verbally Surgical wounds (L abdomen wound)                       Personal Care Assistance Level of Assistance  Bathing, Feeding, Dressing Bathing Assistance: Independent Feeding assistance: Independent Dressing Assistance: Independent     Functional Limitations Info  Sight, Hearing, Speech Sight Info: Adequate Hearing Info: Adequate Speech Info: Adequate    SPECIAL CARE FACTORS FREQUENCY  PT (By licensed PT), OT (By licensed OT)     PT Frequency: 5x week OT Frequency: 5x week  Contractures Contractures Info: Not present    Additional Factors Info  Code Status, Allergies Code Status Info: Partial Allergies Info: Vancomyacin           Current Medications (02/06/2023):  This is the current hospital active medication list Current Facility-Administered Medications  Medication Dose Route Frequency Provider Last Rate Last Admin   acetaminophen (TYLENOL) tablet 650 mg  650 mg Oral Q4H PRN [name], MD   650 mg at 01/27/23  2128   allopurinol (ZYLOPRIM) tablet 100 mg  100 mg Oral Daily [name], MD   100 mg at 02/05/23 1203   amiodarone (PACERONE) tablet 200 mg  200 mg Oral BID [name], MD   200 mg at 02/06/23 1057   chlordiazePOXIDE (LIBRIUM) capsule 25 mg  25 mg Oral Q6H PRN Bensimhon, Shaune Pascal, MD       chlordiazePOXIDE (LIBRIUM) capsule 25 mg  25 mg Oral QID Bensimhon, Shaune Pascal, MD   25 mg at 02/06/23 1056   Followed by   Derrill Memo ON 02/07/2023] chlordiazePOXIDE (LIBRIUM) capsule 25 mg  25 mg Oral TID Bensimhon, Shaune Pascal, MD       Followed by   Derrill Memo ON 02/08/2023] chlordiazePOXIDE (LIBRIUM) capsule 25 mg  25 mg Oral BH-qamhs Bensimhon, Shaune Pascal, MD       Followed by   Derrill Memo ON 02/09/2023] chlordiazePOXIDE (LIBRIUM) capsule 25 mg  25 mg Oral Daily Bensimhon, Shaune Pascal, MD       colchicine tablet 0.6 mg  0.6 mg Oral Daily PRN [name], MD       DAPTOmycin (CUBICIN) 700 mg in sodium chloride 0.9 % IVPB  8 mg/kg Intravenous Q2000 [name], MD 128 mL/hr at 02/05/23 2043 700 mg at 02/05/23 2043   feeding supplement (GLUCERNA SHAKE) (GLUCERNA SHAKE) liquid 237 mL  237 mL Oral TID WC [name], MD   237 mL at 02/05/23 1130   fluticasone (FLONASE) 50 MCG/ACT nasal spray 2 spray  2 spray Each Nare Daily PRN [name], MD   2 spray at 02/06/23 0635   glipiZIDE (GLUCOTROL) tablet 5 mg  5 mg Oral Q breakfast [name], MD   5 mg at 02/04/23 [id number]   guaiFENesin (MUCINEX) 12 hr tablet 600 mg  600 mg Oral BID [name], MD   600 mg at 02/05/23 2110   hydrALAZINE (APRESOLINE) injection 10 mg  10 mg Intravenous Q2H PRN [name], MD   10 mg at 02/06/23 [id number]   hydrOXYzine (ATARAX) tablet 25 mg  25 mg Oral Q6H PRN Bensimhon, Shaune Pascal, MD   25 mg at 02/06/23 1056   insulin aspart (novoLOG) injection 0-24 Units  0-24 Units Subcutaneous TID WC [name], MD   4 Units at 02/05/23 1654   insulin aspart (novoLOG) injection 0-5 Units  0-5 Units Subcutaneous QHS [name], MD    2 Units at 02/05/23 2111   ipratropium (ATROVENT) nebulizer solution 0.5 mg  0.5 mg Nebulization Q4H PRN [name], MD       levalbuterol Penne Lash) nebulizer solution 0.63 mg  0.63 mg Nebulization Q6H PRN [name], MD   0.63 mg at 02/06/23 0429   loperamide (IMODIUM) capsule 2-4 mg  2-4 mg Oral PRN Bensimhon, Shaune Pascal, MD       loratadine (CLARITIN) tablet 10 mg  10 mg Oral Daily [name], MD   10 mg at 02/05/23 1203   metFORMIN (GLUCOPHAGE) tablet 500 mg  500 mg Oral BID WC [name], MD  500 mg at 02/05/23 1654   metoprolol succinate (TOPROL-XL) 24 hr tablet 25 mg  25 mg Oral Daily [name], MD   25 mg at 02/06/23 1057   minocycline (MINOCIN) capsule 200 mg  200 mg Oral BID [name], MD   200 mg at 02/05/23 2110   multivitamin with minerals tablet 1 tablet  1 tablet Oral Q24H [name], MD   1 tablet at 02/05/23 1654   neomycin-bacitracin-polymyxin (NEOSPORIN) ointment   Topical Daily Larey Dresser, MD   1 Application at [id number] 1058   ondansetron (ZOFRAN) injection 4 mg  4 mg Intravenous Q6H PRN [name], MD   4 mg at 02/06/23 0550   Oral care mouth rinse  15 mL Mouth Rinse PRN [name], MD       pantoprazole (PROTONIX) EC tablet 40 mg  40 mg Oral Daily [name], MD   40 mg at 02/05/23 1203   sertraline (ZOLOFT) tablet 50 mg  50 mg Oral Daily [name], MD   50 mg at 02/05/23 1202   tamsulosin (FLOMAX) capsule 0.4 mg  0.4 mg Oral Daily [name], MD   0.4 mg at 02/05/23 1203   [START ON 02/07/2023] thiamine (VITAMIN B1) tablet 100 mg  100 mg Oral Daily Bensimhon, Shaune Pascal, MD       traZODone (DESYREL) tablet 150 mg  150 mg Oral QHS [name], MD   150 mg at 02/05/23 2110   Warfarin - Pharmacist Dosing Inpatient   Does not apply q1600 [name], MD   Given at 02/03/23 1600   zinc sulfate capsule 220 mg  220 mg Oral Daily [name], MD   220 mg at 02/05/23 1203     Discharge  Medications: Please see discharge summary for a list of discharge medications.  Relevant Imaging Results:  Relevant Lab Results:   Additional Information SS# [place] [address] [place]

## 2023-02-06 NOTE — Progress Notes (Signed)
Pt did well through pm, ate snack, denied any issues, n/v, pain.  This am patient up, ambulating in room, had BM.  Around 5:50 am pt c/o nausea and began dry heaving.  VSS, zofran 4 mg iv given.  No relief noted.  Spoke with pharmacy to see if any medications could be causing recurring nausea.  Advised that several medications could cause nausea and the provider would have to determine course of action.

## 2023-02-06 NOTE — Consult Note (Signed)
[facility] Nurse Consult Note: Reason for Consult: scabbed hard area on forearm Discussed with bedside nursing; this is an IV site that infiltrated in the OR last week. It has an hardened area, reddened, with black center Wound type:  IV infiltration, no open wound Pressure Injury POA: NA Measurement: 3cm x 3cm x 0cm area of induration,  however I do not note temperature change in the skin.  0.3cm x 0.3cm x 0cm center black scab Wound bed:see above Drainage (amount, consistency, odor) none Periwound: intact, patient reports TTP Dressing procedure/placement/frequency: Polysporin daily  Top with foam if patient desires.    Re consult if needed, will not follow at this time. Thanks  Zyon Rosser [employer], RN,CWOCN, CNS, [place] [phone])

## 2023-02-06 NOTE — Progress Notes (Signed)
LVAD Coordinator Rounding Note:  Admitted 01/27/23 to Heart Failure service from ED due to driveline infection. Started on IV antibiotics.   S/p driveline debridement in OR 2/21 & 2/28. Wound vac dressing clean, dry, intact. Scant serosanguinous drainage in canister. Plan to return to OR for washout 02/12/23 per Dr Prescott Gum.   Pt laying in bed. Complaining of nausea this morning. Has received PRN Zofran and Ativan. Assisted pt to stand at bedside to use urinal. He was unsteady on his feet and required 1 assist to remain upright. Assisted pt back to bed after using the bathroom. Side rail up, and bed alarm turned on.   Pt will need wound vac and home IV antibiotics at discharge. Pt will need placement in SNF for IV antibiotics at discharge as he is living with his mom and she is unable to help him with care/medication administration. VAD coordinator spoke with his wife last week and they are not currently getting along. It would be much safer for him to be in a SNF post discharge for IV antibiotics. Inpatient social work awaiting bed offers for placement. Pt has been denied admission to [facility], [facility], and [facility]. Search for placement ongoing.   Controller electively exchanged this admission. No further low voltage hazard alarms seen on interrogation.  Vital signs: Temp:  97.5 HR: 90 Doppler Pressure: 168 Automatic BP: 150/95 (111) O2 Sat: 94% on RA Wt: 192.9>197>195.9>190.9>187.6>188.9>184.1>189.6 lbs   LVAD interrogation reveals:  Speed: 5600 Flow: 3.6 Power: 4.3 w PI: 6.4 Alarms: none Events: 100+ today Hct: 33  Fixed speed:  5600 Low speed limit:  5300  Drive Line:  Wound vac dressing clean, dry, intact.  Anchor in place x 2. Suction -100. Suctioning well, no alarms noted. Small amount of serosanguinous drainage noted in canister. Plan for washout and wound vac change in OR today per Dr Prescott Gum.   Labs:  LDH trend:  203>187>142>145>139>161>155>148>152  INR trend: 1.1>1.1>1.4>1.9>2.2>2.4  Anticoagulation Plan: -INR Goal: 2.0 - 2.5 -ASA Dose: none -Heparin 500 u/hr   ICD: N/A  Infection:  01/27/23>> blood cultures>> no growth 4 days 01/27/23>>driveline culture>>FEW METHICILLIN RESISTANT STAPHYLOCOCCUS AUREUS  01/28/23>>driveline cx from OR>>FEW METHICILLIN RESISTANT STAPHYLOCOCCUS AUREUS; final  Plan/Recommendations:  Page VAD coordinator with equipment issues or driveline problems Plan for to return to OR next Wednesday for washout with Dr Lucianne Lei Simon Rhein RN [place] Coordinator  Office: [phone]  24/7 Pager: [phone]

## 2023-02-06 NOTE — TOC Progression Note (Signed)
Transition of Care (TOC) - Progression Note    Patient Details  Name: Kevin Underwood. MRN: [id number] Date of Birth: Jan 27, 1961  [facility]) CM/SW Contact  Coralee Pesa, [place] Phone Number: 02/06/2023, 3:45 PM  Clinical Narrative:    CSW noting pt has an offer from [facility]. CSW called to confirm this and was told it would need to be reviewed again, as [place] and [place] do not do LVAD care. CSW will continue to be available for further needs.    Expected Discharge Plan: [place] Barriers to Discharge: Continued Medical Work up  Expected Discharge Plan and Services   Discharge Planning Services: CM Consult Post Acute Care Choice: [place] arrangements for the past 2 months: [place]: RN           Social Determinants of Health (SDOH) Interventions SDOH Screenings   Food Insecurity: No Food Insecurity (02/05/2023)  Housing: Low Risk  (02/05/2023)  Transportation Needs: Unmet Transportation Needs (02/05/2023)  Utilities: Not At Risk (02/05/2023)  Alcohol Screen: Low Risk  (11/10/2020)  Depression (PHQ2-9): Low Risk  (10/16/2022)  Financial Resource Strain: Medium Risk (05/17/2022)  Physical Activity: Inactive (11/10/2020)  Tobacco Use: Medium Risk (02/05/2023)    Readmission Risk Interventions    07/01/2022    9:05 AM 11/28/2020   11:57 AM  Readmission Risk Prevention Plan  Transportation Screening Complete Complete  Medication Review Press photographer) Complete Complete  PCP or Specialist appointment within 3-5 days of discharge Complete   HRI or [place] Complete Complete  SW Recovery Care/Counseling Consult Complete Complete  Palliative Care Screening Not Applicable Not [place] Not Applicable Not Applicable

## 2023-02-06 NOTE — Anesthesia Postprocedure Evaluation (Signed)
Anesthesia Post Note  Patient: Kevin Underwood.  Procedure(s) Performed: ABDOMINAL DRIVELINE WOUND DEBRIDEMENT WOUND VAC CHANGE     Patient location during evaluation: PACU Anesthesia Type: General Level of consciousness: sedated and patient cooperative Pain management: pain level controlled Vital Signs Assessment: post-procedure vital signs reviewed and stable Respiratory status: spontaneous breathing Cardiovascular status: stable Anesthetic complications: no   No notable events documented.  Last Vitals:  Vitals:   02/06/23 0830 02/06/23 0930  BP: (!) 158/107 (!) 150/95  Pulse: 90 90  Resp: 18 20  Temp:    SpO2: 92%     Last Pain:  Vitals:   02/06/23 0730  TempSrc:   PainSc: 0-No pain                 Nolon Nations

## 2023-02-06 NOTE — Plan of Care (Signed)
  Problem: Education: Goal: Patient will understand all VAD equipment and how it functions Outcome: Progressing Goal: Patient will be able to verbalize current INR target range and antiplatelet therapy for discharge home Outcome: Progressing   Problem: Cardiac: Goal: LVAD will function as expected and patient will experience no clinical alarms Outcome: Progressing   Problem: Health Behavior/Discharge Planning: Goal: Ability to manage health-related needs will improve Outcome: Progressing   Problem: Clinical Measurements: Goal: Diagnostic test results will improve Outcome: Progressing Goal: Respiratory complications will improve Outcome: Progressing Goal: Cardiovascular complication will be avoided Outcome: Progressing

## 2023-02-07 DIAGNOSIS — T829XXD Unspecified complication of cardiac and vascular prosthetic device, implant and graft, subsequent encounter: Secondary | ICD-10-CM | POA: Diagnosis not present

## 2023-02-07 LAB — BASIC METABOLIC PANEL
Anion gap: 10 (ref 5–15)
BUN: 17 mg/dL (ref 8–23)
CO2: 22 mmol/L (ref 22–32)
Calcium: 8.6 mg/dL — ABNORMAL LOW (ref 8.9–10.3)
Chloride: 101 mmol/L (ref 98–111)
Creatinine, Ser: 1.19 mg/dL (ref 0.61–1.24)
GFR, Estimated: >60 mL/min (ref >60)
Glucose, Bld: 145 mg/dL — ABNORMAL HIGH (ref 70–99)
Potassium: 3.8 mmol/L (ref 3.5–5.1)
Sodium: 133 mmol/L — ABNORMAL LOW (ref 135–145)

## 2023-02-07 LAB — GLUCOSE, CAPILLARY
Glucose-Capillary: 155 mg/dL — ABNORMAL HIGH (ref 70–99)
Glucose-Capillary: 87 mg/dL (ref 70–99)
Glucose-Capillary: 147 mg/dL — ABNORMAL HIGH (ref 70–99)
Glucose-Capillary: 180 mg/dL — ABNORMAL HIGH (ref 70–99)

## 2023-02-07 LAB — PROTIME-INR
INR: 1.8 — ABNORMAL HIGH (ref 0.8–1.2)
Prothrombin Time: 20.8 seconds — ABNORMAL HIGH (ref 11.4–15.2)

## 2023-02-07 LAB — LACTATE DEHYDROGENASE: LDH: 149 U/L (ref 98–192)

## 2023-02-07 MED ORDER — LORAZEPAM 2 MG/ML IJ SOLN
INTRAMUSCULAR | Status: AC
  Filled 2023-02-07: qty 1

## 2023-02-07 MED ORDER — LACTATED RINGERS IV BOLUS
500.0000 mL | Freq: Once | INTRAVENOUS | Status: AC
  Administered 2023-02-07: 500 mL via INTRAVENOUS

## 2023-02-07 MED ORDER — WARFARIN SODIUM 4 MG PO TABS
4.0000 mg | ORAL_TABLET | Freq: Once | ORAL | Status: AC
  Administered 2023-02-07: 4 mg via ORAL
  Filled 2023-02-07: qty 1

## 2023-02-07 MED ORDER — WARFARIN SODIUM 3 MG PO TABS: 6.0000 mg | ORAL_TABLET | Freq: Once | ORAL | Status: DC

## 2023-02-07 MED ORDER — LORAZEPAM 2 MG/ML IJ SOLN: 1.0000 mg | Freq: Once | INTRAMUSCULAR | Status: AC

## 2023-02-07 NOTE — Progress Notes (Addendum)
ANTICOAGULATION CONSULT NOTE  Pharmacy Consult for Heparin > warfarin  Indication: LVAD  Allergies  Allergen Reactions   Vancomycin Nausea And Vomiting and Other (See Comments)    Severe joint pain    Patient Measurements: Height: '5\' 10"'$  (177.8 cm) Weight: 82.7 kg (182 lb 5.1 oz) IBW/kg (Calculated) : 73   Vital Signs: Temp: 98 F (36.7 C) (03/01 0718) Temp Source: Oral (03/01 0718) BP: 86/74 (03/01 0718) Pulse Rate: 78 (02/29 2309)  Labs: Recent Labs    02/04/23 1101 02/05/23 0701 02/06/23 0017 02/07/23 0608  HGB  --  11.6* 10.5*  --   HCT  --  33.5* 29.7*  --   PLT  --  245 203  --   LABPROT  --  24.1* 26.0* 20.8*  INR  --  2.2* 2.4* 1.8*  CREATININE  --  1.15 1.47* 1.19  CKTOTAL 62  --   --   --      Estimated Creatinine Clearance: 66.5 mL/min (by C-G formula based on SCr of 1.19 mg/dL).   Medical History: Past Medical History:  Diagnosis Date   "    Arthritis    CAD (coronary artery disease)    a. s/p CABG in 11/2019 with LIMA-LAD, SVG-OM1, SVG-PDA and SVG-D1   CHF (congestive heart failure) (HCC)    a. EF < 20% by echo in 11/2019   Essential hypertension    PAF (paroxysmal atrial fibrillation) (HCC)    Type 2 diabetes mellitus (HCC)     Medications:   DAPTOmycin (CUBICIN) 700 mg in sodium chloride 0.9 % IVPB 700 mg (02/06/23 2013)    Assessment: 62 yo male on chronic Coumadin for LVAD, poor compliance with outpatient checks.  INR on admission 1.1. S/p OR 2/20 for drive line infection debridement.   Previous Coumadin dose 6 mg daily except 4 mg on Tues, Thurs.  INR down from 2.4 to 1.8. Hgb 10.5, plt 203 on last check 2/29. LDH stable at 149. No s/sx of bleeding. Underwent I&D with wound VAC change 2/28 - next plan for 3/6.   Goal of Therapy:  INR 2-2.5 Monitor platelets by anticoagulation protocol: Yes   Plan:  Will order warfarin 4 mg tonight Daily CBC, heparin level and INR  Antonietta Jewel, PharmD, BCCCP Clinical Pharmacist  Phone:  [phone] 02/07/2023 7:21 AM  Please check AMION for all [place] phone numbers After 10:00 PM, call [place] [phone]

## 2023-02-07 NOTE — TOC Progression Note (Signed)
Transition of Care (TOC) - Progression Note    Patient Details  Name: Kevin Underwood. MRN: [id number] Date of Birth: October 16, 1961  [facility]) CM/SW Contact  Erenest Rasher, RN Phone Number:[phone] 02/07/2023, 2:30 PM  Clinical Narrative:     Spoke to pt and wife, wife states she has done IV abx in the home in the past. States pt will dc home and she will be in home to assist. Plan is for SNF for IV abx, safely administered and completed, contacted [facility] rep, Debbie. Will fax referral for Debbie to review. Will need to ensure pt receives full course of abx for wound healing.     Expected Discharge Plan: [place] Barriers to Discharge: Continued Medical Work up  Expected Discharge Plan and Services   Discharge Planning Services: CM Consult Post Acute Care Choice: [place] arrangements for the past 2 months: [place]: RN           Social Determinants of Health (SDOH) Interventions SDOH Screenings   Food Insecurity: No Food Insecurity (02/05/2023)  Housing: Low Risk  (02/05/2023)  Transportation Needs: Unmet Transportation Needs (02/05/2023)  Utilities: Not At Risk (02/05/2023)  Alcohol Screen: Low Risk  (11/10/2020)  Depression (PHQ2-9): Low Risk  (10/16/2022)  Financial Resource Strain: Medium Risk (05/17/2022)  Physical Activity: Inactive (11/10/2020)  Tobacco Use: Medium Risk (02/05/2023)    Readmission Risk Interventions    07/01/2022    9:05 AM 11/28/2020   11:57 AM  Readmission Risk Prevention Plan  Transportation Screening Complete Complete  Medication Review Press photographer) Complete Complete  PCP or Specialist appointment within 3-5 days of discharge Complete   HRI or [place] Complete Complete  SW Recovery Care/Counseling Consult Complete Complete  Palliative Care Screening Not Applicable Not [place] Not Applicable  Not Applicable

## 2023-02-07 NOTE — Progress Notes (Signed)
2 Days Post-Op Procedure(s) (LRB): ABDOMINAL DRIVELINE WOUND DEBRIDEMENT (N/A) WOUND VAC CHANGE (N/A) Subjective: Patient sitting up in chair without complaint, receiving IV saline for probable hypovolemia according to VAD parameters.  Operative wound cultures 48 hours ago positive for MRSA. Minimal serosanguineous drainage from wound VAC.  Wound VAC sponge compressed and system functioning well.  Plan return to the OR for wound exploration and washout and probable wound VAC change on Wednesday, March 6.  Objective: Vital signs in last 24 hours: Temp:  [97.7 F (36.5 C)-98.1 F (36.7 C)] 98 F (36.7 C) (03/01 0718) Pulse Rate:  [78-90] 90 (03/01 0718) Cardiac Rhythm: Normal sinus rhythm (03/01 0822) Resp:  [14-20] 16 (03/01 0718) BP: (86-138)/(74-104) 86/74 (03/01 0718) SpO2:  [92 %-98 %] 96 % (03/01 0718) Weight:  [82.7 kg] 82.7 kg (03/01 0624)  Hemodynamic parameters for last 24 hours: Afebrile sinus rhythm    Intake/Output from previous day: 02/29 0701 - 03/01 0700 In: 608 [P.O.:480; IV Piggyback:128] Out: 1525 [Urine:1500; Drains:25] Intake/Output this shift: Total I/O In: 480 [P.O.:480] Out: -        Exam    General- alert and comfortable.  Wound VAC sponge compressed, nontender without leak.    Neck- no JVD, no cervical adenopathy palpable, no carotid bruit   Lungs- clear without rales, wheezes   Cor- regular rate and rhythm, normal VAD hum.     Abdomen- soft, non-tender   Extremities - warm, non-tender, minimal edema   Neuro- oriented, appropriate, no focal weakness   Lab Results: Recent Labs    02/05/23 0701 02/06/23 0017  WBC 5.8 6.2  HGB 11.6* 10.5*  HCT 33.5* 29.7*  PLT 245 203   BMET:  Recent Labs    02/06/23 0017 02/07/23 0608  NA 134* 133*  K 3.5 3.8  CL 101 101  CO2 26 22  GLUCOSE 167* 145*  BUN 29* 17  CREATININE 1.47* 1.19  CALCIUM 8.3* 8.6*    PT/INR:  Recent Labs    02/07/23 0608  LABPROT 20.8*  INR 1.8*   ABG     Component Value Date/Time   PHART 7.508 (H) 11/20/2020 0438   HCO3 24.8 09/09/2021 0313   TCO2 29 11/20/2020 0438   ACIDBASEDEF 6.0 (H) 11/24/2019 1718   O2SAT 63.4 09/09/2021 0313   CBG (last 3)  Recent Labs    02/06/23 1656 02/06/23 2112 02/07/23 0617  GLUCAP 162* 122* 155*    Assessment/Plan: S/P Procedure(s) (LRB): ABDOMINAL DRIVELINE WOUND DEBRIDEMENT (N/A) WOUND VAC CHANGE (N/A) 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  Patient needs continued IV antibiotics and wound VAC therapy with transition to daily wet-to-dry dressings with Vashe-hypochlorous acid once wound VAC therapy no longer needed. Next return to the OR scheduled March 6. Continue Coumadin for INR goal 2.0-2.5  LOS: 11 days    [name] 02/07/2023

## 2023-02-07 NOTE — Progress Notes (Signed)
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  He has retching while I was in the room. States he  had similar experience int he past while being on vanc(switched to dapto). Tolerating dapto-nausea resolved.  -washout/vac exchange on 2/28, non viable fascia debrided, no Cx Recommendations: -Continue  daptomycin, plan on 6 weeks IV from 2/20 I&D then suppressive minocycline(unless pt has clinical change then will re-evaluate end date) -Please call closer to discahrge for IDD appt and OPAT orders.   #R forearm induration with central scab following IV line infiltration last week -WOC following -U/S forearm  ID will sign off.    Microbiology:   Antibiotics: Merrem 2/19-20 Vancomycin 2/19-  Cultures: Blood 2/19 ng    SUBJECTIVE: Sitting in chair. No new complaints. Eating well per pt.  Interval:  afebrile, wbc 6.2k  Review of Systems: Review of Systems  All other systems reviewed and are negative.    Scheduled Meds:  allopurinol  100 mg Oral Daily   amiodarone  200 mg Oral BID   chlordiazePOXIDE  25 mg Oral TID   Followed by   Derrill Memo ON 02/08/2023] chlordiazePOXIDE  25 mg Oral BH-qamhs   Followed by   Derrill Memo ON 02/09/2023] chlordiazePOXIDE  25 mg Oral Daily   feeding supplement (GLUCERNA SHAKE)  237 mL Oral TID WC   glipiZIDE  5 mg Oral Q breakfast   guaiFENesin  600 mg Oral BID   insulin aspart  0-24 Units Subcutaneous TID WC   insulin aspart  0-5 Units Subcutaneous QHS   loratadine  10 mg Oral  Daily   metFORMIN  500 mg Oral BID WC   metoprolol succinate  25 mg Oral Daily   minocycline  200 mg Oral BID   multivitamin with minerals  1 tablet Oral Q24H   neomycin-bacitracin-polymyxin   Topical Daily   pantoprazole  40 mg Oral Daily   sertraline  50 mg Oral Daily   tamsulosin  0.4 mg Oral Daily   thiamine  100 mg Oral Daily   traZODone  150 mg Oral QHS   warfarin  4 mg Oral ONCE-1600   Warfarin - Pharmacist Dosing Inpatient   Does not apply q1600   zinc sulfate  220 mg Oral Daily   Continuous Infusions:  DAPTOmycin (CUBICIN) 700 mg in sodium chloride 0.9 % IVPB Stopped (02/06/23 2043)   PRN Meds:.acetaminophen, chlordiazePOXIDE, colchicine, fluticasone, hydrALAZINE, hydrOXYzine, ipratropium, levalbuterol, loperamide, ondansetron (ZOFRAN) IV, mouth rinse Allergies  Allergen Reactions   Vancomycin Nausea And Vomiting and Other (See Comments)    Severe joint pain    OBJECTIVE: Vitals:   02/07/23 0400 02/07/23 0624 02/07/23 0718 02/07/23 1200  BP: 97/82  (!) 86/74 (!) 81/70  Pulse:   90 97  Resp:   16 16  Temp:   98 F (36.7 C) (!) 97.4 F (36.3 C)  TempSrc:   Oral Oral  SpO2:   96% 94%  Weight:  82.7 kg    Height:       Body mass  index is 26.16 kg/m.  Physical Exam Constitutional:      General: He is not in acute distress.    Appearance: He is normal weight. He is not toxic-appearing.  HENT:     Head: Normocephalic and atraumatic.     Right Ear: External ear normal.     Left Ear: External ear normal.     Nose: No congestion or rhinorrhea.     Mouth/Throat:     Mouth: Mucous membranes are moist.     Pharynx: Oropharynx is clear.  Eyes:     Extraocular Movements: Extraocular movements intact.     Conjunctiva/sclera: Conjunctivae normal.     Pupils: Pupils are equal, round, and reactive to light.  Cardiovascular:     Rate and Rhythm: Normal rate and regular rhythm.     Heart sounds: No murmur heard.    No friction rub. No gallop.  Pulmonary:     Effort:  Pulmonary effort is normal.     Breath sounds: Normal breath sounds.  Abdominal:     General: Abdomen is flat. Bowel sounds are normal.     Palpations: Abdomen is soft.  Musculoskeletal:        General: No swelling. Normal range of motion.     Cervical back: Normal range of motion and neck supple.  Skin:    General: Skin is warm and dry.  Neurological:     General: No focal deficit present.     Mental Status: He is oriented to person, place, and time.  Psychiatric:        Mood and Affect: Mood normal.       Lab Results Lab Results  Component Value Date   WBC 6.2 02/06/2023   HGB 10.5 (L) 02/06/2023   HCT 29.7 (L) 02/06/2023   MCV 80.9 02/06/2023   PLT 203 02/06/2023    Lab Results  Component Value Date   CREATININE 1.19 02/07/2023   BUN 17 02/07/2023   NA 133 (L) 02/07/2023   K 3.8 02/07/2023   CL 101 02/07/2023   CO2 22 02/07/2023    Lab Results  Component Value Date   ALT 17 01/27/2023   AST 18 01/27/2023   ALKPHOS 101 01/27/2023   BILITOT 0.5 01/27/2023        Laurice Record, [facility] 02/07/2023, 12:40 PM

## 2023-02-07 NOTE — Progress Notes (Signed)
Patient called RN to room saying he wasn't feeling well and asked for breathing treatment.  RT called for treatment then patient began to feel nauseated.  BP 149/107 (120), PI elevated to >6. LVAD Coordinator, Minette Brine, paged to inform of this repeated event that has been occurring for the past few days.  Patient then began vomiting.  '1mg'$  IV Ativan given per order. Bed alarm set for patient safety.

## 2023-02-07 NOTE — Progress Notes (Addendum)
Patient ID: Kevin Millers., male   DOB: [birth year], 62 y.o.   MRN: [id number]   Advanced Heart Failure VAD Team Note  PCP-Cardiologist: Rozann Lesches, MD  AHF: Dr. 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 On IV vancomycin.  OR for debridement w/ wound vac placement 2/20 and 2/28.  Vanc switched to daptomycin 2/27 d/t uncontrolled nausea  Remains on warfarin, INR 1.8. Hep gtt and ASA stopped 2/27. Returned to OR 2/28 for washout and wound vac, plan for same again 3/6.  CIWA protocol started yesterday. This morning feels much better. No nausea.  LVAD INTERROGATION:  HeartMate III LVAD:   Flow 4.6 liters/min, speed 5600, power 4, PI 3.4 .  128 PI events.  Objective:    Vital Signs:   Temp:  [97.6 F (36.4 C)-98.1 F (36.7 C)] 98 F (36.7 C) (02/29 2309) Pulse Rate:  [78-92] 78 (02/29 2309) Resp:  [14-20] 14 (02/29 2309) BP: (115-171)/(76-107) 115/99 (02/29 2309) SpO2:  [92 %-98 %] 97 % (02/29 2309) Weight:  [82.7 kg] 82.7 kg (03/01 0624) Last BM Date : 02/05/23 Mean arterial Pressure 90s  Intake/Output:   Intake/Output Summary (Last 24 hours) at 02/07/2023 0705 Last data filed at 02/07/2023 [id number] Gross per 24 hour  Intake 480 ml  Output 1525 ml  Net -1045 ml    Physical Exam  General:  Well appearing. No resp difficulty HEENT: Normal Neck: supple. No JVP. Carotids 2+ bilat; no bruits. No lymphadenopathy or thyromegaly appreciated. Cor: Mechanical heart sounds with LVAD hum present. Lungs: Clear Abdomen: soft, nontender, nondistended. No hepatosplenomegaly. No bruits or masses. Good bowel sounds. Driveline: C/D/I; securement device intact. Wound vac in place, good suction Extremities: no cyanosis, clubbing, rash, edema Neuro: alert & orientedx3, cranial nerves grossly intact. moves all 4 extremities w/o difficulty. Affect pleasant  Telemetry   NSR 90s, 0-8 PVCs (Personally reviewed)    EKG   No new EKG  Labs   Basic Metabolic  Panel: Recent Labs  Lab 02/03/23 0600 02/04/23 0017 02/05/23 0701 02/06/23 0017 02/07/23 0608  NA 133* 134* 135 134* 133*  K 3.8 4.0 3.7 3.5 3.8  CL 97* 98 99 101 101  CO2 '24 23 24 26 22  '$ GLUCOSE 141* 110* 120* 167* 145*  BUN 25* 26* 16 29* 17  CREATININE 1.08 1.12 1.15 1.47* 1.19  CALCIUM 8.4* 8.5* 8.8* 8.3* 8.6*  MG  --  1.8  --   --   --    Liver Function Tests: No results for input(s): "AST", "ALT", "ALKPHOS", "BILITOT", "PROT", "ALBUMIN" in the last 168 hours.  CBC: Recent Labs  Lab 02/03/23 0021 02/03/23 2026 02/04/23 0017 02/05/23 0701 02/06/23 0017  WBC 5.7 5.7 5.8 5.8 6.2  HGB 11.2* 11.8* 11.3* 11.6* 10.5*  HCT 33.1* 35.3* 32.8* 33.5* 29.7*  MCV 82.8 84.7 82.4 81.7 80.9  PLT 192 156 208 245 203   INR: Recent Labs  Lab 02/03/23 0021 02/04/23 0017 02/05/23 0701 02/06/23 0017 02/07/23 0608  INR 1.4* 1.9* 2.2* 2.4* 1.8*    Other results: EKG:   Imaging   No results found.  Medications:     Scheduled Medications:  allopurinol  100 mg Oral Daily   amiodarone  200 mg Oral BID   chlordiazePOXIDE  25 mg Oral TID   Followed by   Derrill Memo ON 02/08/2023] chlordiazePOXIDE  25 mg Oral BH-qamhs   Followed by   Derrill Memo ON 02/09/2023] chlordiazePOXIDE  25 mg Oral  Daily   feeding supplement (GLUCERNA SHAKE)  237 mL Oral TID WC   glipiZIDE  5 mg Oral Q breakfast   guaiFENesin  600 mg Oral BID   insulin aspart  0-24 Units Subcutaneous TID WC   insulin aspart  0-5 Units Subcutaneous QHS   loratadine  10 mg Oral Daily   metFORMIN  500 mg Oral BID WC   metoprolol succinate  25 mg Oral Daily   minocycline  200 mg Oral BID   multivitamin with minerals  1 tablet Oral Q24H   neomycin-bacitracin-polymyxin   Topical Daily   pantoprazole  40 mg Oral Daily   sertraline  50 mg Oral Daily   tamsulosin  0.4 mg Oral Daily   thiamine  100 mg Oral Daily   traZODone  150 mg Oral QHS   Warfarin - Pharmacist Dosing Inpatient   Does not apply q1600   zinc sulfate  220 mg  Oral Daily    Infusions:  DAPTOmycin (CUBICIN) 700 mg in sodium chloride 0.9 % IVPB 700 mg (02/06/23 2013)    PRN Medications: acetaminophen, chlordiazePOXIDE, colchicine, fluticasone, hydrALAZINE, hydrOXYzine, ipratropium, levalbuterol, loperamide, ondansetron (ZOFRAN) IV, mouth rinse  Patient Profile  62 y.o. 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 type 2 diabetes mellitus, COPD, hypertension, and HMIII VAD.    Admitted with recurrent LVAD complication driveline infection + suspected AECOPD Assessment/Plan:   1. LVAD Complication---> Recurrent driveline infection: MRSA driveline infection, admitted in 6/23 and again in 7/23-8/23 with multiple trips to the OR for debridement. He had completed vancomycin and then long-term doxycycline. Most recent culture PTA grew acinetobacter and MRSA. ID started minocycline. He had been off minocycline for at least 3 days prior to this admit w/ copious drainage around driveline. CT chest/abd/pelvis showed stranding around the driveline and small amount of fluid. Started on Vanc and Meropenum per ID. Now off meropenem (2/21).  OR for debridement w/ wound vac placement 2/20. Wound culture with MRSA, blood cultures negative.  Afebrile.  - Will need 6 wks abx. Vanc switched to dapto 2/27 with nausea. Then back to minocycline alone.   - Plan to return to OR for washout/vac change 3/6. Keep INR 1.8-2.2 until then.  2. Chronic HFrEF: Echo 10/21 with EF 20-25%, mildly decreased RV function. LHC/RHC in 12/21 with patent grafts, low output. Suspect mixed ischemic/nonischemic cardiomyopathy (prior heavy ETOH and drugs as well as CAD).  No ETOH, drugs, smoking since CABG in 12/20. Admitted with cardiogenic shock in 12/21, had placement of Impella 5.5 initially, now s/p Heartmate 3 LVAD on 11/17/20.  Ramp  echo 2/23 with speed decreased to 5500 rpm. Volume status stable. Now off entresto due to lower Maps and elevated creatinine.  Does not need lasix.  - LDH 149 - Warfarin restarted, goal INR 1.8-2.2 until surgery. Now off hep gtt and ASA. INR 1.8, dosing per pharmD  - Not a candidate for transplant with ongoing drug use.  3. HTN: MAPs elevated low 100s on admit, poor compliance with meds. Has fluctuated significantly while admitted. MAP 90s - Can continue Toprol XL 25 mg daily  - Entresto stopped due to lower Maps.   - BP more stable on hydralazine PRN 4. Polysubstance Abuse: Recently started using methamphetamine again.  UDS + for amphetamines.  - SW team following. Unfortunately, limited resources for inpatient substance use due LVAD -  suspect withdrawal from meth. - Now on CIWA 5. PAF: S/p Maze and LA appendage clip with CABG in 12/20.  NSR this admission.  6. 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  Still some wheezing but has improved.   - Continue scheduled atrovent.  - Levalbuterol prn.  - Have avoided prednisone with surgery at driveline site for recurrent infection.   7. DMII - SSI  8.  NSVT/PVCs: Frequent NSVT 2/23, may have been dry after diuresis.  Amiodarone gtt started to suppress.  - Continue po amiodarone 200 mg bid.  - Would eventually stop this medication, avoid with COPD.   9. Nausea - Began 2/27. Assumed 2/2 vancomycin initially however vanc switched to daptomycin and nausea continues.  - suspect etiology 2/2 withdrawal. - resolved yesterday after ativan x1 - now on CIWA 2/29  Patient refusing to work with PT, not motivated and expresses frustration with his overall situation.  Ongoing drug use, no longer living with his wife.  Has been living with his mother who cannot care for him.  He will need SNF for IV antibiotics at discharge.  TOC Team working to find SNF.    I reviewed the LVAD parameters from today, and compared the results to the patient's prior recorded data.  No  programming changes were made.  The LVAD is functioning within specified parameters.  The patient performs LVAD self-test daily.  LVAD interrogation was negative for any significant power changes, alarms or PI events/speed drops.  LVAD equipment check completed and is in good working order.  Back-up equipment present.   LVAD education done on emergency procedures and precautions and reviewed exit site care.  Length of Stay: [place], NP 02/07/2023, 7:05 AM  VAD Team --- VAD ISSUES ONLY--- Pager [phone] (7am - 7am)  Advanced Heart Failure Team  Pager [phone] (M-F; 7a - 5p)  Please contact [facility] for night-coverage after hours (5p -7a ) and weekends on [email or web address]  Patient seen and examined with the above-signed Advanced Practice Provider and/or Housestaff. I personally reviewed laboratory data, imaging studies and relevant notes. I independently examined the patient and formulated the important aspects of the plan. I have edited the note to reflect any of my changes or salient points. I have personally discussed the plan with the patient and/or family.  HTN and agitation resolved with librium. Remains on IV abx.  No f/c. INR 1.8  VAD interrogated personally. Parameters stable.  General:  NAD.  HEENT: normal  Neck: supple. JVP not elevated.  Carotids 2+ bilat; no bruits. No lymphadenopathy or thryomegaly appreciated. Cor: LVAD hum.  Lungs: Clear. Abdomen: obese soft, nontender, non-distended. No hepatosplenomegaly. No bruits or masses. Good bowel sounds. Driveline site with wound vac. Anchor in place.  Extremities: no cyanosis, clubbing, rash. Warm no edema  Neuro: alert & oriented x 3. No focal deficits. Moves all 4 without problem   Wound site stable. No f/c chills. Suspect HTN and agitation due to withdrawal. Now improved on librium. Continue CIWA protocol. Continue IV abx. Next wound vac change this next week. INR 1.8. Discussed dosing with PharmD personally.  Glori Bickers, MD  5:19 PM

## 2023-02-07 NOTE — Progress Notes (Signed)
LVAD Coordinator Rounding Note:  Admitted 01/27/23 to Heart Failure service from ED due to driveline infection. Started on IV antibiotics.   S/p driveline debridement in OR 2/21 & 2/28. Wound vac dressing clean, dry, intact. Scant serosanguinous drainage in canister. Plan to return to OR for washout 02/12/23 per Dr Prescott Gum.   Pt laying in bed. States he feels much better this morning. Nausea has resolved.   Upon VAD interrogation continuous suction events. Dr. Haroldine Laws made aware. Orders received for 500cc LR Bolus. RN made aware of new orders.  Pt will need wound vac and home IV antibiotics at discharge. Pt will need placement in SNF for IV antibiotics at discharge as he is living with his mom and she is unable to help him with care/medication administration. VAD coordinator spoke with his wife last week and they are not currently getting along. It would be much safer for him to be in a SNF post discharge for IV antibiotics. Inpatient social work awaiting bed offers for placement. Pt has been denied admission to [facility], [facility], and [facility]. Search for placement ongoing.   Controller electively exchanged this admission. No further low voltage hazard alarms seen on interrogation.  Vital signs: Temp:  98 HR: 91 Doppler Pressure: 84 Automatic BP: 101/86 (92) O2 Sat: 96% on RA Wt: 192.9>197>195.9>190.9>187.6>188.9>184.1>189.6>182.3 lbs   LVAD interrogation reveals:  Speed: 5550 Flow: 4.6 Power: 4.1 w PI: 3.6 Alarms: none Events: 100+ today Hct: 33  Fixed speed:  5600 Low speed limit:  5300  Drive Line:  Wound vac dressing clean, dry, intact.  Anchor in place x 2. Suction -100. Suctioning well, no alarms noted. Small amount of serosanguinous drainage noted in canister. Plan for washout and wound vac change in OR next Wednesday per Dr Prescott Gum.   Labs:  LDH trend: 203>187>142>145>139>161>155>148>152>149  INR trend: 1.1>1.1>1.4>1.9>2.2>2.4>1.8  Anticoagulation  Plan: -INR Goal: 2.0 - 2.5 -ASA Dose: none -Heparin 500 u/hr   ICD: N/A  Infection:  01/27/23>> blood cultures>> no growth 4 days 01/27/23>>driveline culture>>FEW METHICILLIN RESISTANT STAPHYLOCOCCUS AUREUS  01/28/23>>driveline cx from OR>>FEW METHICILLIN RESISTANT STAPHYLOCOCCUS AUREUS; final  Plan/Recommendations:  Page VAD coordinator with equipment issues or driveline problems Plan for to return to OR next Wednesday for washout with Dr Lucianne Lei Lytle Michaels RN,BSN [place] Coordinator  Office: [phone]  24/7 Pager: [phone]

## 2023-02-08 DIAGNOSIS — T829XXD Unspecified complication of cardiac and vascular prosthetic device, implant and graft, subsequent encounter: Secondary | ICD-10-CM | POA: Diagnosis not present

## 2023-02-08 LAB — LACTATE DEHYDROGENASE: LDH: 172 U/L (ref 98–192)

## 2023-02-08 LAB — PROTIME-INR
INR: 1.5 — ABNORMAL HIGH (ref 0.8–1.2)
Prothrombin Time: 18 seconds — ABNORMAL HIGH (ref 11.4–15.2)

## 2023-02-08 LAB — CBC
HCT: 33.7 % — ABNORMAL LOW (ref 39.0–52.0)
Hemoglobin: 11.7 g/dL — ABNORMAL LOW (ref 13.0–17.0)
MCH: 28.1 pg (ref 26.0–34.0)
MCHC: 34.7 g/dL (ref 30.0–36.0)
MCV: 80.8 fL (ref 80.0–100.0)
Platelets: 284 10*3/uL (ref 150–400)
RBC: 4.17 MIL/uL — ABNORMAL LOW (ref 4.22–5.81)
RDW: 13.6 % (ref 11.5–15.5)
WBC: 8.5 10*3/uL (ref 4.0–10.5)
nRBC: 0 % (ref 0.0–0.2)

## 2023-02-08 LAB — BASIC METABOLIC PANEL
Anion gap: 11 (ref 5–15)
BUN: 18 mg/dL (ref 8–23)
CO2: 23 mmol/L (ref 22–32)
Calcium: 8.7 mg/dL — ABNORMAL LOW (ref 8.9–10.3)
Chloride: 103 mmol/L (ref 98–111)
Creatinine, Ser: 1.18 mg/dL (ref 0.61–1.24)
GFR, Estimated: >60 mL/min (ref >60)
Glucose, Bld: 161 mg/dL — ABNORMAL HIGH (ref 70–99)
Potassium: 3.7 mmol/L (ref 3.5–5.1)
Sodium: 137 mmol/L (ref 135–145)

## 2023-02-08 LAB — GLUCOSE, CAPILLARY
Glucose-Capillary: 154 mg/dL — ABNORMAL HIGH (ref 70–99)
Glucose-Capillary: 95 mg/dL (ref 70–99)
Glucose-Capillary: 101 mg/dL — ABNORMAL HIGH (ref 70–99)
Glucose-Capillary: 148 mg/dL — ABNORMAL HIGH (ref 70–99)

## 2023-02-08 LAB — HEPARIN LEVEL (UNFRACTIONATED): Heparin Unfractionated: <0.1 IU/mL — ABNORMAL LOW (ref 0.30–0.70)

## 2023-02-08 MED ORDER — WARFARIN SODIUM 3 MG PO TABS
6.0000 mg | ORAL_TABLET | Freq: Once | ORAL | Status: AC
  Administered 2023-02-08: 6 mg via ORAL
  Filled 2023-02-08: qty 2

## 2023-02-08 MED ORDER — POTASSIUM CHLORIDE CRYS ER 20 MEQ PO TBCR
40.0000 meq | EXTENDED_RELEASE_TABLET | Freq: Once | ORAL | Status: AC
  Administered 2023-02-08: 40 meq via ORAL
  Filled 2023-02-08: qty 2

## 2023-02-08 MED ORDER — KCL-LACTATED RINGERS 20 MEQ/L IV SOLN
INTRAVENOUS | Status: AC
  Filled 2023-02-08: qty 1000

## 2023-02-08 MED ORDER — KCL-LACTATED RINGERS 20 MEQ/L IV SOLN
INTRAVENOUS | Status: DC
  Filled 2023-02-08: qty 1000

## 2023-02-08 MED ORDER — HEPARIN (PORCINE) 25000 UT/250ML-% IV SOLN
500.0000 [IU]/h | INTRAVENOUS | Status: DC
  Administered 2023-02-08: 500 [IU]/h via INTRAVENOUS
  Filled 2023-02-08 (×2): qty 250

## 2023-02-08 NOTE — Progress Notes (Signed)
ANTICOAGULATION CONSULT NOTE  Pharmacy Consult for Heparin > warfarin  Indication: LVAD  Allergies  Allergen Reactions   Vancomycin Nausea And Vomiting and Other (See Comments)    Severe joint pain    Patient Measurements: Height: '5\' 10"'$  (177.8 cm) Weight: 83.8 kg (184 lb 11.9 oz) IBW/kg (Calculated) : 73   Vital Signs: Temp: 97.7 F (36.5 C) (03/02 0754) Temp Source: Oral (03/02 0754) BP: 100/76 (03/02 0754) Pulse Rate: 89 (03/02 0754)  Labs: Recent Labs    02/06/23 0017 02/07/23 0608 02/08/23 0616  HGB 10.5*  --  11.7*  HCT 29.7*  --  33.7*  PLT 203  --  284  LABPROT 26.0* 20.8* 18.0*  INR 2.4* 1.8* 1.5*  CREATININE 1.47* 1.19 1.18     Estimated Creatinine Clearance: 67 mL/min (by C-G formula based on SCr of 1.18 mg/dL).   Medical History: Past Medical History:  Diagnosis Date   "    Arthritis    CAD (coronary artery disease)    a. s/p CABG in 11/2019 with LIMA-LAD, SVG-OM1, SVG-PDA and SVG-D1   CHF (congestive heart failure) (HCC)    a. EF < 20% by echo in 11/2019   Essential hypertension    PAF (paroxysmal atrial fibrillation) (HCC)    Type 2 diabetes mellitus (HCC)     Medications:   DAPTOmycin (CUBICIN) 700 mg in sodium chloride 0.9 % IVPB 700 mg (02/07/23 2035)   heparin      Assessment: 62 yo male on chronic Coumadin for LVAD, poor compliance with outpatient checks.  INR on admission 1.1. S/p OR 2/20 for drive line infection debridement.   Previous Coumadin dose 6 mg daily except 4 mg on Tues, Thurs.  INR down from 2.4 to 1.5. Hgb 11.7, plt 284 on last check 2/29. LDH stable at 149. No s/sx of bleeding. Underwent I&D with wound VAC change 2/28 - next plan for 3/6.   Discussed with Dr. Haroldine Laws, will initiate low, fixed dose heparin while INR low.  Goal of Therapy:  INR 2-2.5 Monitor platelets by anticoagulation protocol: Yes   Plan:  Start IV heparin 500 units/hr without bolus.  Check heparin level in 8 hrs to ensure not greater than  ~ 0.5. Warfarin 6 mg po x 1 tonight Daily CBC, heparin level and INR  [place] Crane, Roylene Reason, [facility] Clinical Pharmacist  02/08/2023 9:13 AM   [facility] pharmacy phone numbers are listed on [email or web address]

## 2023-02-08 NOTE — Progress Notes (Signed)
PT Cancellation Note  Patient Details Name: Kevin Underwood. MRN: [id number] DOB: May 06, 1961   Cancelled Treatment:    Reason Eval/Treat Not Completed: PT screened, no needs identified, will sign off (new order received however pt has been walking with limited assist 56' with mobility specialist and pt reports he is going to D/C home and has no need or interest in PT/OT acutely or at D/C. will sign off per pt request.)   Bion Todorov B Dravin Lance 02/08/2023, 8:41 AM Bayard Males, [place] Office: [phone]

## 2023-02-08 NOTE — Progress Notes (Signed)
OT Cancellation Note  Patient Details Name: Kevin Underwood. MRN: [id number] DOB: 1961-07-23   Cancelled Treatment:    Reason Eval/Treat Not Completed: Other (comment). Pt recently evaluated for OT on 02/01/23 and reported that he did not want any acute therapies. Pt declining PT this AM. RN also reporting patient doesn't feel well. Will sign off as pt declining therapies at acute level.   [place], OTR/L Acute Rehab Office: [phone] 02/08/2023, 12:53 PM

## 2023-02-08 NOTE — Progress Notes (Signed)
Patient ID: Kevin Millers., male   DOB: [birth year], 62 y.o.   MRN: [id number]   Advanced Heart Failure VAD Team Note  PCP-Cardiologist: Rozann Lesches, MD  AHF: Dr. 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 On IV vancomycin.  OR for debridement w/ wound vac placement 2/20 and 2/28.  Vanc switched to daptomycin 2/27 d/t uncontrolled nausea  Had more n/v overnight. Didn't sleep well. Received IVF, ativan and zofran. Feels a bit better this am   LVAD INTERROGATION:  HeartMate III LVAD:   Flow 4.3 liters/min, speed 5600, power 4.0, PI 6.4 .  Multiple PI events.  Objective:    Vital Signs:   Temp:  [97.4 F (36.3 C)-97.8 F (36.6 C)] 97.7 F (36.5 C) (03/02 0754) Pulse Rate:  [86-97] 89 (03/02 0754) Resp:  [16-21] 18 (03/02 0754) BP: (81-149)/(70-107) 100/76 (03/02 0754) SpO2:  [94 %-96 %] 96 % (03/01 2256) Weight:  [83.8 kg] 83.8 kg (03/02 0610) Last BM Date : 02/07/23 Mean arterial Pressure 80s-90s  Intake/Output:   Intake/Output Summary (Last 24 hours) at 02/08/2023 0941 Last data filed at 02/08/2023 0939 Gross per 24 hour  Intake 544 ml  Output 2675 ml  Net -2131 ml     Physical Exam  General:  Lying in bed. NAD.  HEENT: normal  Neck: supple. JVP not elevated.  Carotids 2+ bilat; no bruits. No lymphadenopathy or thryomegaly appreciated. Cor: LVAD hum.  Lungs: Clear. Abdomen:soft, nontender, non-distended. No hepatosplenomegaly. No bruits or masses. Good bowel sounds. Wound vac site okn. Anchor in place.  Extremities: no cyanosis, clubbing, rash. Warm no edema  Neuro: alert & oriented x 3. No focal deficits. Moves all 4 without problem   Telemetry   NSR 80-90s + PVCs Personally reviewed  Labs   Basic Metabolic Panel: Recent Labs  Lab 02/04/23 0017 02/05/23 0701 02/06/23 0017 02/07/23 0608 02/08/23 0616  NA 134* 135 134* 133* 137  K 4.0 3.7 3.5 3.8 3.7  CL 98 99 101 101 103  CO2 '23 24 26 22 23  '$ GLUCOSE 110* 120* 167*  145* 161*  BUN 26* 16 29* 17 18  CREATININE 1.12 1.15 1.47* 1.19 1.18  CALCIUM 8.5* 8.8* 8.3* 8.6* 8.7*  MG 1.8  --   --   --   --     Liver Function Tests: No results for input(s): "AST", "ALT", "ALKPHOS", "BILITOT", "PROT", "ALBUMIN" in the last 168 hours.  CBC: Recent Labs  Lab 02/03/23 2026 02/04/23 0017 02/05/23 0701 02/06/23 0017 02/08/23 0616  WBC 5.7 5.8 5.8 6.2 8.5  HGB 11.8* 11.3* 11.6* 10.5* 11.7*  HCT 35.3* 32.8* 33.5* 29.7* 33.7*  MCV 84.7 82.4 81.7 80.9 80.8  PLT 156 208 245 203 284    INR: Recent Labs  Lab 02/04/23 0017 02/05/23 0701 02/06/23 0017 02/07/23 0608 02/08/23 0616  INR 1.9* 2.2* 2.4* 1.8* 1.5*     Other results:  Imaging   No results found.  Medications:     Scheduled Medications:  allopurinol  100 mg Oral Daily   amiodarone  200 mg Oral BID   chlordiazePOXIDE  25 mg Oral BH-qamhs   Followed by   Derrill Memo ON 02/09/2023] chlordiazePOXIDE  25 mg Oral Daily   feeding supplement (GLUCERNA SHAKE)  237 mL Oral TID WC   glipiZIDE  5 mg Oral Q breakfast   guaiFENesin  600 mg Oral BID   insulin aspart  0-24 Units Subcutaneous TID WC   insulin  aspart  0-5 Units Subcutaneous QHS   loratadine  10 mg Oral Daily   metFORMIN  500 mg Oral BID WC   metoprolol succinate  25 mg Oral Daily   minocycline  200 mg Oral BID   multivitamin with minerals  1 tablet Oral Q24H   neomycin-bacitracin-polymyxin   Topical Daily   pantoprazole  40 mg Oral Daily   potassium chloride  40 mEq Oral Once   sertraline  50 mg Oral Daily   tamsulosin  0.4 mg Oral Daily   thiamine  100 mg Oral Daily   traZODone  150 mg Oral QHS   warfarin  6 mg Oral ONCE-1600   Warfarin - Pharmacist Dosing Inpatient   Does not apply q1600   zinc sulfate  220 mg Oral Daily    Infusions:  DAPTOmycin (CUBICIN) 700 mg in sodium chloride 0.9 % IVPB 700 mg (02/07/23 2035)   heparin      PRN Medications: acetaminophen, chlordiazePOXIDE, colchicine, fluticasone, hydrALAZINE,  hydrOXYzine, ipratropium, levalbuterol, loperamide, ondansetron (ZOFRAN) IV, mouth rinse  Patient Profile  62 y.o. 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 type 2 diabetes mellitus, COPD, hypertension, and HMIII VAD.    Admitted with recurrent LVAD complication driveline infection + suspected AECOPD Assessment/Plan:   1. LVAD Complication---> Recurrent driveline infection: MRSA driveline infection, admitted in 6/23 and again in 7/23-8/23 with multiple trips to the OR for debridement. He had completed vancomycin and then long-term doxycycline. Most recent culture PTA grew acinetobacter and MRSA. ID started minocycline. He had been off minocycline for at least 3 days prior to this admit w/ copious drainage around driveline. CT chest/abd/pelvis showed stranding around the driveline and small amount of fluid. Started on Vanc and Meropenum per ID. Now off meropenem (2/21).  OR for debridement w/ wound vac placement 2/20. Wound culture with MRSA, blood cultures negative.  Afebrile.  - Will need 6 wks abx. Vanc switched to dapto 2/27 with nausea. Now on dapto/minocycline. Once dapto complete continue suppressive minocycline   - Plan to return to OR for washout/vac change 3/6. Keep INR 1.8-2.2 until then.  2. Chronic HFrEF: Echo 10/21 with EF 20-25%, mildly decreased RV function. LHC/RHC in 12/21 with patent grafts, low output. Suspect mixed ischemic/nonischemic cardiomyopathy (prior heavy ETOH and drugs as well as CAD).  No ETOH, drugs, smoking since CABG in 12/20. Admitted with cardiogenic shock in 12/21, had placement of Impella 5.5 initially, now s/p Heartmate 3 LVAD on 11/17/20.  Ramp echo 2/23 with speed decreased to 5500 rpm. Volume status low. Now off entresto due to lower Maps and elevated creatinine.  Does not need lasix.  - Will give IVF  today with poor po intake - Warfarin restarted, goal INR 1.8-2.2 until surgery. INR 1.5, restart low-dose heparin Discussed dosing with PharmD personally. - Not a candidate for transplant with ongoing drug use.  3. HTN: MAPs elevated low 100s on admit, poor compliance with meds. Has fluctuated significantly while admitted. MAP 90s - Can continue Toprol XL 25 mg daily  - Entresto stopped due to lower Maps.   - BP more stable on hydralazine PRN - Has spikes of HTN with n/v. ? Drug withdrawal 4. Polysubstance Abuse: Recently started using methamphetamine again.  UDS + for amphetamines.  - SW team following. Unfortunately, limited resources for inpatient substance use due LVAD - Having paroxysms  of n/v and HTN. suspect withdrawal from meth. - Now on CIWA 5. PAF: S/p Maze and LA appendage clip with CABG in 12/20.  NSR this admission.  6. 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  Still some wheezing but has improved.   - Continue scheduled atrovent.  - Levalbuterol prn.  - Have avoided prednisone with surgery at driveline site for recurrent infection.   7. DMII - SSI  8.  NSVT/PVCs: Frequent NSVT 2/23, may have been dry after diuresis.  Amiodarone gtt started to suppress.  - Continue po amiodarone 200 mg bid.  - Would eventually stop this medication, avoid with COPD.   9. Nausea/vomiting - Began 2/27. Assumed 2/2 vancomycin initially however vanc switched to daptomycin and nausea continues.  - suspect etiology 2/2 withdrawal. - resolved yesterday after ativan x1 - now on CIWA 2/29 - KUB ok. Give IVF today  Patient refusing to work with PT, not motivated and expresses frustration with his overall situation.  Ongoing drug use, no longer living with his wife.  Has been living with his mother who cannot care for him.  He will need SNF for IV antibiotics at discharge.  TOC Team working to find SNF.    I reviewed the LVAD parameters from today, and compared the results to the patient's prior recorded data.  No  programming changes were made.  The LVAD is functioning within specified parameters.  The patient performs LVAD self-test daily.  LVAD interrogation was negative for any significant power changes, alarms or PI events/speed drops.  LVAD equipment check completed and is in good working order.  Back-up equipment present.   LVAD education done on emergency procedures and precautions and reviewed exit site care.  Length of Stay: 12  Glori Bickers, MD 02/08/2023, 9:41 AM  VAD Team --- VAD ISSUES ONLY--- Pager [phone] (7am - 7am)  Advanced Heart Failure Team  Pager [phone] (M-F; 7a - 5p)  Please contact [facility] for night-coverage after hours (5p -7a ) and weekends on [email or web address]

## 2023-02-08 NOTE — Progress Notes (Signed)
ANTICOAGULATION CONSULT NOTE  Pharmacy Consult for Heparin > warfarin  Indication: LVAD  Allergies  Allergen Reactions   Vancomycin Nausea And Vomiting and Other (See Comments)    Severe joint pain    Patient Measurements: Height: '5\' 10"'$  (177.8 cm) Weight: 83.8 kg (184 lb 11.9 oz) IBW/kg (Calculated) : 73   Vital Signs: Temp: 98.1 F (36.7 C) (03/02 1914) Temp Source: Oral (03/02 1914) BP: 70/38 (03/02 1914) Pulse Rate: 89 (03/02 1547)  Labs: Recent Labs    02/06/23 0017 02/07/23 0608 02/08/23 0616 02/08/23 1811  HGB 10.5*  --  11.7*  --   HCT 29.7*  --  33.7*  --   PLT 203  --  284  --   LABPROT 26.0* 20.8* 18.0*  --   INR 2.4* 1.8* 1.5*  --   HEPARINUNFRC  --   --   --  <0.10*  CREATININE 1.47* 1.19 1.18  --      Estimated Creatinine Clearance: 67 mL/min (by C-G formula based on SCr of 1.18 mg/dL).   Medical History: Past Medical History:  Diagnosis Date   "    Arthritis    CAD (coronary artery disease)    a. s/p CABG in 11/2019 with LIMA-LAD, SVG-OM1, SVG-PDA and SVG-D1   CHF (congestive heart failure) (HCC)    a. EF < 20% by echo in 11/2019   Essential hypertension    PAF (paroxysmal atrial fibrillation) (HCC)    Type 2 diabetes mellitus (HCC)     Medications:   DAPTOmycin (CUBICIN) 700 mg in sodium chloride 0.9 % IVPB 700 mg (02/07/23 2035)   heparin 500 Units/hr (02/08/23 1021)    Assessment: 62 yo male on chronic Coumadin for LVAD, poor compliance with outpatient checks.  INR on admission 1.1. S/p OR 2/20 for drive line infection debridement.   Previous Coumadin dose 6 mg daily except 4 mg on Tues, Thurs.  INR down from 2.4 to 1.5. Hgb 11.7, plt 284 on last check 2/29. LDH stable at 149. No s/sx of bleeding. Underwent I&D with wound VAC change 2/28 - next plan for 3/6.   Discussed with Dr. Haroldine Laws, will initiate low, fixed dose heparin while INR low.  Heparin level appropriately undetectable.  Goal of Therapy:  INR 2-2.5 Monitor  platelets by anticoagulation protocol: Yes   Plan:  Continue IV heparin 500 units/hr without bolus. Daily CBC, heparin level and INR  Alanda Slim, PharmD, [facility] Clinical Pharmacist Please see AMION for all Pharmacists' Contact Phone Numbers 02/08/2023, 7:38 PM

## 2023-02-09 ENCOUNTER — Encounter (HOSPITAL_COMMUNITY): Payer: Self-pay | Admitting: Cardiology

## 2023-02-09 DIAGNOSIS — T829XXD Unspecified complication of cardiac and vascular prosthetic device, implant and graft, subsequent encounter: Secondary | ICD-10-CM | POA: Diagnosis not present

## 2023-02-09 DIAGNOSIS — F32A Depression, unspecified: Secondary | ICD-10-CM | POA: Insufficient documentation

## 2023-02-09 DIAGNOSIS — F1590 Other stimulant use, unspecified, uncomplicated: Secondary | ICD-10-CM | POA: Insufficient documentation

## 2023-02-09 LAB — GLUCOSE, CAPILLARY
Glucose-Capillary: 149 mg/dL — ABNORMAL HIGH (ref 70–99)
Glucose-Capillary: 109 mg/dL — ABNORMAL HIGH (ref 70–99)
Glucose-Capillary: 124 mg/dL — ABNORMAL HIGH (ref 70–99)
Glucose-Capillary: 114 mg/dL — ABNORMAL HIGH (ref 70–99)

## 2023-02-09 LAB — CBC
HCT: 30.2 % — ABNORMAL LOW (ref 39.0–52.0)
Hemoglobin: 10.5 g/dL — ABNORMAL LOW (ref 13.0–17.0)
MCH: 28.7 pg (ref 26.0–34.0)
MCHC: 34.8 g/dL (ref 30.0–36.0)
MCV: 82.5 fL (ref 80.0–100.0)
Platelets: 268 10*3/uL (ref 150–400)
RBC: 3.66 MIL/uL — ABNORMAL LOW (ref 4.22–5.81)
RDW: 13.6 % (ref 11.5–15.5)
WBC: 7.4 10*3/uL (ref 4.0–10.5)
nRBC: 0 % (ref 0.0–0.2)

## 2023-02-09 LAB — BASIC METABOLIC PANEL
Anion gap: 7 (ref 5–15)
BUN: 18 mg/dL (ref 8–23)
CO2: 24 mmol/L (ref 22–32)
Calcium: 8.6 mg/dL — ABNORMAL LOW (ref 8.9–10.3)
Chloride: 106 mmol/L (ref 98–111)
Creatinine, Ser: 1.35 mg/dL — ABNORMAL HIGH (ref 0.61–1.24)
GFR, Estimated: 59 mL/min — ABNORMAL LOW (ref >60)
Glucose, Bld: 103 mg/dL — ABNORMAL HIGH (ref 70–99)
Potassium: 3.9 mmol/L (ref 3.5–5.1)
Sodium: 137 mmol/L (ref 135–145)

## 2023-02-09 LAB — PROTIME-INR
INR: 1.8 — ABNORMAL HIGH (ref 0.8–1.2)
Prothrombin Time: 20.7 seconds — ABNORMAL HIGH (ref 11.4–15.2)

## 2023-02-09 LAB — HEPARIN LEVEL (UNFRACTIONATED): Heparin Unfractionated: <0.1 IU/mL — ABNORMAL LOW (ref 0.30–0.70)

## 2023-02-09 LAB — LACTATE DEHYDROGENASE: LDH: 184 U/L (ref 98–192)

## 2023-02-09 MED ORDER — MIRTAZAPINE 7.5 MG PO TABS
15.0000 mg | ORAL_TABLET | Freq: Every day | ORAL | Status: DC
  Administered 2023-02-09 – 2023-02-19 (×11): 15 mg via ORAL
  Filled 2023-02-09 (×11): qty 2

## 2023-02-09 MED ORDER — WARFARIN SODIUM 5 MG PO TABS
5.0000 mg | ORAL_TABLET | Freq: Once | ORAL | Status: DC
  Filled 2023-02-09: qty 1

## 2023-02-09 MED ORDER — CHLORDIAZEPOXIDE HCL 5 MG PO CAPS: 25.0000 mg | ORAL_CAPSULE | Freq: Four times a day (QID) | ORAL | Status: AC | PRN

## 2023-02-09 MED ORDER — LORAZEPAM 2 MG/ML IJ SOLN
2.0000 mg | Freq: Once | INTRAMUSCULAR | Status: AC
  Administered 2023-02-09: 2 mg via INTRAVENOUS
  Filled 2023-02-09: qty 1

## 2023-02-09 NOTE — Progress Notes (Signed)
[place] for Heparin + warfarin  Indication: LVAD  Allergies  Allergen Reactions   Vancomycin Nausea And Vomiting and Other (See Comments)    Severe joint pain    Patient Measurements: Height: '5\' 10"'$  (177.8 cm) Weight: 83.8 kg (184 lb 11.9 oz) IBW/kg (Calculated) : 73   Vital Signs: Temp: 98.1 F (36.7 C) (03/03 0557) Temp Source: Oral (03/03 0557) BP: 85/57 (03/03 0557) Pulse Rate: 90 (03/02 2310)  Labs: Recent Labs    02/07/23 0608 02/08/23 0616 02/08/23 1811 02/09/23 0034  HGB  --  11.7*  --  10.5*  HCT  --  33.7*  --  30.2*  PLT  --  284  --  268  LABPROT 20.8* 18.0*  --  20.7*  INR 1.8* 1.5*  --  1.8*  HEPARINUNFRC  --   --  <0.10* <0.10*  CREATININE 1.19 1.18  --  1.35*     Estimated Creatinine Clearance: 58.6 mL/min (A) (by C-G formula based on SCr of 1.35 mg/dL (H)).   Medical History: Past Medical History:  Diagnosis Date   "    Arthritis    CAD (coronary artery disease)    a. s/p CABG in 11/2019 with LIMA-LAD, SVG-OM1, SVG-PDA and SVG-D1   CHF (congestive heart failure) (HCC)    a. EF < 20% by echo in 11/2019   Essential hypertension    PAF (paroxysmal atrial fibrillation) (HCC)    Type 2 diabetes mellitus (HCC)     Medications:   DAPTOmycin (CUBICIN) 700 mg in sodium chloride 0.9 % IVPB 700 mg (02/08/23 2131)   heparin 500 Units/hr (02/09/23 0557)    Assessment: 62 yo male on chronic Coumadin for LVAD, poor compliance with outpatient checks.  INR on admission 1.1. S/p OR 2/20 for drive line infection debridement.   Previous Coumadin dose 6 mg daily except 4 mg on Tues, Thurs.  INR up 1.5>1.8. Hgb 10.5, plt 268 on last check 2/29. LDH stable at 149. No s/sx of bleeding. Underwent I&D with wound VAC change 2/28 - next plan for 3/6.   Heparin initiated 3/2 for low INR at fixed, low dose of 500 units/hr.  Heparin level appropriately undetectable. No overt bleeding or complications noted.  Goal of  Therapy:  INR 2-2.3 (keeping low in anticipation of return to OR soon) Monitor platelets by anticoagulation protocol: Yes   Plan:  Continue IV heparin 500 units/hr without bolus. Daily CBC, heparin level and INR  [place] Crane, Roylene Reason, [facility] Clinical Pharmacist  02/09/2023 7:31 AM   [facility] pharmacy phone numbers are listed on [email or web address]

## 2023-02-09 NOTE — Progress Notes (Signed)
Patient ID: Kevin Millers., male   DOB: 08-Mar-1961, 62 y.o.   MRN: [id number]   Advanced Heart Failure VAD Team Note  PCP-Cardiologist: Rozann Lesches, MD  AHF: Dr. 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 On IV vancomycin.  OR for debridement w/ wound vac placement 2/20 and 2/28.  This am tearful and very distraught. Saying he is tired of his life and wishes he could turn his VAD off. Says hi wife and mother hate each other and can't help him. He is yelling and slamming the table. Dry heaving.  MAPs elevated.    LVAD INTERROGATION:  HeartMate III LVAD:   Flow 3.5 liters/min, speed 5600, power 4.0, PI 7.8 .  + PI events.   Objective:    Vital Signs:   Temp:  [97.7 F (36.5 C)-99 F (37.2 C)] 97.7 F (36.5 C) (03/03 0743) Pulse Rate:  [81-92] 81 (03/03 0743) Resp:  [15-20] 17 (03/03 0743) BP: (63-169)/(38-112) 169/112 (03/03 0743) SpO2:  [96 %-97 %] 97 % (03/03 0743) Weight:  [85.8 kg] 85.8 kg (03/03 0743) Last BM Date : 02/07/23 Mean arterial Pressure 100-110   Intake/Output:   Intake/Output Summary (Last 24 hours) at 02/09/2023 1123 Last data filed at 02/09/2023 0557 Gross per 24 hour  Intake 97.78 ml  Output 600 ml  Net -502.22 ml     Physical Exam   General:  Sitting up in bed. Distraught and tearful  Slamming the table HEENT: normal  Neck: supple. JVP not elevated.  Carotids 2+ bilat; no bruits. No lymphadenopathy or thryomegaly appreciated. Cor: LVAD hum.  Lungs: Clear. Abdomen: obese soft, nontender, non-distended. No hepatosplenomegaly. No bruits or masses. Good bowel sounds. Driveline site with wound vac. No drainage or erythema. Anchor in place.  Extremities: no cyanosis, clubbing, rash. Warm no edema  Neuro: alert & oriented x 3. No focal deficits. Moves all 4 without problem    Telemetry   NSR 80-90s + PVCs Personally reviewed  Labs   Basic Metabolic Panel: Recent Labs  Lab 02/04/23 0017 02/05/23 0701  02/06/23 0017 02/07/23 0608 02/08/23 0616 02/09/23 0034  NA 134* 135 134* 133* 137 137  K 4.0 3.7 3.5 3.8 3.7 3.9  CL 98 99 101 101 103 106  CO2 '23 24 26 22 23 24  '$ GLUCOSE 110* 120* 167* 145* 161* 103*  BUN 26* 16 29* '17 18 18  '$ CREATININE 1.12 1.15 1.47* 1.19 1.18 1.35*  CALCIUM 8.5* 8.8* 8.3* 8.6* 8.7* 8.6*  MG 1.8  --   --   --   --   --     Liver Function Tests: No results for input(s): "AST", "ALT", "ALKPHOS", "BILITOT", "PROT", "ALBUMIN" in the last 168 hours.  CBC: Recent Labs  Lab 02/04/23 0017 02/05/23 0701 02/06/23 0017 02/08/23 0616 02/09/23 0034  WBC 5.8 5.8 6.2 8.5 7.4  HGB 11.3* 11.6* 10.5* 11.7* 10.5*  HCT 32.8* 33.5* 29.7* 33.7* 30.2*  MCV 82.4 81.7 80.9 80.8 82.5  PLT 208 245 203 284 268    INR: Recent Labs  Lab 02/05/23 0701 02/06/23 0017 02/07/23 0608 02/08/23 0616 02/09/23 0034  INR 2.2* 2.4* 1.8* 1.5* 1.8*     Other results:  Imaging   No results found.  Medications:     Scheduled Medications:  allopurinol  100 mg Oral Daily   amiodarone  200 mg Oral BID   feeding supplement (GLUCERNA SHAKE)  237 mL Oral TID WC   glipiZIDE  5 mg Oral Q breakfast   guaiFENesin  600 mg Oral BID   insulin aspart  0-24 Units Subcutaneous TID WC   insulin aspart  0-5 Units Subcutaneous QHS   loratadine  10 mg Oral Daily   metFORMIN  500 mg Oral BID WC   metoprolol succinate  25 mg Oral Daily   minocycline  200 mg Oral BID   multivitamin with minerals  1 tablet Oral Q24H   neomycin-bacitracin-polymyxin   Topical Daily   pantoprazole  40 mg Oral Daily   sertraline  50 mg Oral Daily   tamsulosin  0.4 mg Oral Daily   thiamine  100 mg Oral Daily   traZODone  150 mg Oral QHS   warfarin  5 mg Oral ONCE-1600   Warfarin - Pharmacist Dosing Inpatient   Does not apply q1600   zinc sulfate  220 mg Oral Daily    Infusions:  DAPTOmycin (CUBICIN) 700 mg in sodium chloride 0.9 % IVPB 700 mg (02/08/23 2131)   heparin 500 Units/hr (02/09/23 0557)    PRN  Medications: acetaminophen, colchicine, fluticasone, hydrALAZINE, ipratropium, levalbuterol, ondansetron (ZOFRAN) IV, mouth rinse  Patient Profile  62 y.o. 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 type 2 diabetes mellitus, COPD, hypertension, and HMIII VAD.    Admitted with recurrent LVAD complication driveline infection + suspected AECOPD Assessment/Plan:   1. LVAD Complication---> Recurrent driveline infection: MRSA driveline infection, admitted in 6/23 and again in 7/23-8/23 with multiple trips to the OR for debridement. He had completed vancomycin and then long-term doxycycline. Most recent culture PTA grew acinetobacter and MRSA. ID started minocycline. He had been off minocycline for at least 3 days prior to this admit w/ copious drainage around driveline. CT chest/abd/pelvis showed stranding around the driveline and small amount of fluid. Started on Vanc and Meropenum per ID. Now off meropenem (2/21).  OR for debridement w/ wound vac placement 2/20. Wound culture with MRSA, blood cultures negative.  Afebrile.  - Will need 6 wks abx. Vanc switched to dapto 2/27 with nausea. Now on dapto/minocycline. Once dapto complete continue suppressive minocycline   - Currently AF. Site looks ok  - Plan to return to OR for washout/vac change 3/6. Keep INR 1.8-2.2 until then.  2. Chronic HFrEF: Echo 10/21 with EF 20-25%, mildly decreased RV function. LHC/RHC in 12/21 with patent grafts, low output. Suspect mixed ischemic/nonischemic cardiomyopathy (prior heavy ETOH and drugs as well as CAD).  No ETOH, drugs, smoking since CABG in 12/20. Admitted with cardiogenic shock in 12/21, had placement of Impella 5.5 initially, now s/p Heartmate 3 LVAD on 11/17/20.  Ramp echo 2/23 with speed decreased to 5500 rpm.  - Volume status ok. Po intake has been  poor but received 1L IVF yesterday - Warfarin restarted, goal INR 1.8-2.2 until surgery. INR 1.8, Can stop low-dose heparin Discussed dosing with PharmD personally. - Not a candidate for transplant with ongoing drug use.  3. HTN: MAPs elevated low 100s on admit, poor compliance with meds. Has fluctuated significantly while admitted. MAPs elevated this am in setting of emotional distress - Can continue Toprol XL 25 mg daily  - Entresto stopped due to lower Maps.   - BP more stable on hydralazine PRN - Has spikes of HTN with n/v. ? Drug withdrawal 4. Polysubstance Abuse: Recently started using methamphetamine again.  UDS + for  amphetamines.  - SW team following. Unfortunately, limited resources for inpatient substance use due LVAD - Having paroxysms of n/v and HTN. suspect withdrawal from meth. - Will continue CIWA protocol for now until we can get Psych to see 5. PAF: S/p Maze and LA appendage clip with CABG in 12/20.  NSR this admission.  6. 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  Still some wheezing but has improved.   - Continue scheduled atrovent.  - Levalbuterol prn.  - Have avoided prednisone with surgery at driveline site for recurrent infection.   7. DMII - SSI  8.  NSVT/PVCs: Frequent NSVT 2/23, may have been dry after diuresis.  Amiodarone gtt started to suppress.  - Continue po amiodarone 200 mg bid.  - Would eventually stop this medication, avoid with COPD.   9. Nausea/vomiting - Began 2/27. Assumed 2/2 vancomycin initially however vanc switched to daptomycin and nausea continues.  - KUB ok - suspect etiology due to severe emotional distress/anxiety +/- substance abuse WD  - Continue CIWA until Psych can see 10. Major depression - Patient refusing to work with PT, not motivated and expresses frustration with his overall situation.  Ongoing drug use, no longer living with his wife.  Has been living with his mother who cannot care for him.  - Now saying that he would like his VAD to be off  so that his wife and mother don't have him in common any more  - will consult Psych for help with management [facility] team paged - awaiting response)  I reviewed the LVAD parameters from today, and compared the results to the patient's prior recorded data.  No programming changes were made.  The LVAD is functioning within specified parameters.  The patient performs LVAD self-test daily.  LVAD interrogation was negative for any significant power changes, alarms or PI events/speed drops.  LVAD equipment check completed and is in good working order.  Back-up equipment present.   LVAD education done on emergency procedures and precautions and reviewed exit site care.  Length of Stay: 13  Glori Bickers, MD 02/09/2023, 11:23 AM  VAD Team --- VAD ISSUES ONLY--- Pager [phone] (7am - 7am)  Advanced Heart Failure Team  Pager [phone] (M-F; 7a - 5p)  Please contact [facility] for night-coverage after hours (5p -7a ) and weekends on [email or web address]

## 2023-02-09 NOTE — Consult Note (Signed)
[place] [place] [facility] Consult   Reason for Consult:  Suicidal Ideation Referring Physician:  Dr. Aundra Dubin Patient Identification: Kevin Underwood. MRN:  [id number] Principal Diagnosis: Left ventricular assist device (LVAD) complication, subsequent encounter Diagnosis:  Principal Problem:   Left ventricular assist device (LVAD) complication, subsequent encounter Active Problems:   Stimulant use disorder   Depression   Total Time spent with patient: 30 minutes  Subjective:   Kevin Underwood. is a 62 y.o. male patient admitted with  Chief Complaint  Patient presents with   LVAD   Infection    HPI:   Patient seen laying in bed this afternoon on my approach. He reports that he is currently in this hospital due to, "my heart stopped."   Psychiatry was consulted due to concerns from the patient's primary team that he had made comments concerning for suicide.  Patient admits that he has been frustrated with the relationship between his mother and his wife. He reports that the two of them can't get along and it has been difficult for him to deal with. He has been feeling increasingly depressed and he admits that he has been abusing methamphetamines recently. He has been having difficulty with eating and he has been having poor sleep.   When asked if the patient told any staff member that he didn't want to live or had thoughts of harming himself he stated this was not true. He denies wanting to hurt himself or anyone else. He did report that he did not want to deal with the stress of his mother and wife's relationship but no thoughts of self harm. He also reports that he had no intent to refuse any of his care in the hospital and stated that the doctors and nurses had been taking good care of him.  Past Psychiatric History: Depression and Stimulant Use Disorder Amphetamine Type Substance  Risk to Self:   No Risk to Others:   No Prior Inpatient Therapy:   No Prior Outpatient  Therapy:   No  Past Medical History:  Past Medical History:  Diagnosis Date   "    Arthritis    CAD (coronary artery disease)    a. s/p CABG in 11/2019 with LIMA-LAD, SVG-OM1, SVG-PDA and SVG-D1   CHF (congestive heart failure) (HCC)    a. EF < 20% by echo in 11/2019   Essential hypertension    PAF (paroxysmal atrial fibrillation) (HCC)    Type 2 diabetes mellitus ([place])     Past Surgical History:  Procedure Laterality Date   APPLICATION OF WOUND VAC Left 06/14/2022   Procedure: APPLICATION OF WOUND VAC;  Surgeon: [name], MD;  Location: [place];  Service: Thoracic;  Laterality: Left;   APPLICATION OF WOUND VAC N/A 06/20/2022   Procedure: VAD POWERCORD TUNNEL WOUND [place];  Surgeon: [name], MD;  Location: [place];  Service: Thoracic;  Laterality: N/A;   APPLICATION OF WOUND VAC N/A 06/27/2022   Procedure: WOUND VAC CHANGE;  Surgeon: [name], MD;  Location: [place];  Service: Thoracic;  Laterality: N/A;   APPLICATION OF WOUND VAC N/A 07/08/2022   Procedure: WOUND VAC CHANGE;  Surgeon: [name], MD;  Location: [place];  Service: Thoracic;  Laterality: N/A;   APPLICATION OF WOUND VAC N/A 01/28/2023   Procedure: APPLICATION OF WOUND VAC;  Surgeon: [name], MD;  Location: [place];  Service: Thoracic;  Laterality: N/A;   APPLICATION OF WOUND VAC N/A 02/05/2023   Procedure: WOUND VAC CHANGE;  Surgeon: [name], MD;  Location[facility];  Service: Thoracic;  Laterality: N/A;   BACK SURGERY     CLIPPING OF ATRIAL APPENDAGE N/A 11/26/2019   Procedure: Clipping Of Atrial Appendage using AtriCure 40 Clip;  Surgeon: Wonda Olds, MD;  Location: [place];  Service: Open Heart Surgery;  Laterality: N/A;   CORONARY ARTERY BYPASS GRAFT N/A 11/26/2019   Procedure: CORONARY ARTERY BYPASS GRAFTING (CABG) using endoscopic greater saphenous vein harvest: svc to OM; svc to Diag; svc to PD; and LIMA to LAD.;  Surgeon: Wonda Olds, MD;  Location: [place];  Service: Open  Heart Surgery;  Laterality: N/A;   FOOT SURGERY     HAND SURGERY     INSERTION OF IMPLANTABLE LEFT VENTRICULAR ASSIST DEVICE N/A 11/17/2020   Procedure: INSERTION OF IMPLANTABLE LEFT VENTRICULAR ASSIST DEVICE - HM3;  Surgeon: Wonda Olds, MD;  Location: [place];  Service: Open Heart Surgery;  Laterality: N/A;   INTRAOPERATIVE TRANSESOPHAGEAL ECHOCARDIOGRAM  12/04/2019   Procedure: Intraoperative Transesophageal Echocardiogram;  Surgeon: Wonda Olds, MD;  [facility];  Service: Open Heart Surgery;;   MAZE N/A 11/26/2019   Procedure: MAZE using Bilateral Pulmonary Vein isolation.;  Surgeon: Wonda Olds, MD;  Location: [place];  Service: Open Heart Surgery;  Laterality: N/A;   MULTIPLE EXTRACTIONS WITH ALVEOLOPLASTY N/A 11/16/2020   Procedure: MULTIPLE EXTRACTION WITH ALVEOLOPLASTY;  Surgeon: Charlaine Dalton, DMD;  Location: [place];  Service: Dentistry;  Laterality: N/A;   PLACEMENT OF IMPELLA LEFT VENTRICULAR ASSIST DEVICE N/A 11/24/2019   Procedure: PLACEMENT OF IMPELLA 5.5 LEFT VENTRICULAR ASSIST DEVICE;  Surgeon: Wonda Olds, MD;  Location: [place];  Service: Open Heart Surgery;  Laterality: N/A;   PLACEMENT OF IMPELLA LEFT VENTRICULAR ASSIST DEVICE Left 11/10/2020   Procedure: PLACEMENT OF IMPELLA 5.5 LEFT VENTRICULAR ASSIST DEVICE VIA  LEFT AVILLARY ARTERY;  Surgeon: Wonda Olds, MD;  Location: [place];  Service: Open Heart Surgery;  Laterality: Left;   REMOVAL OF IMPELLA LEFT VENTRICULAR ASSIST DEVICE Right 12/04/2019   Procedure: REMOVAL OF IMPELLA LEFT VENTRICULAR ASSIST DEVICE, right axilla;  Surgeon: Wonda Olds, MD;  Location: [place];  Service: Open Heart Surgery;  Laterality: Right;   RIGHT/LEFT HEART CATH AND CORONARY ANGIOGRAPHY N/A 11/24/2019   Procedure: RIGHT/LEFT HEART CATH AND CORONARY ANGIOGRAPHY;  Surgeon: Larey Dresser, MD;  Location: [place] [facility];  Service: Cardiovascular;  Laterality: N/A;   RIGHT/LEFT HEART CATH AND CORONARY/GRAFT  ANGIOGRAPHY N/A 11/09/2020   Procedure: RIGHT/LEFT HEART CATH AND CORONARY/GRAFT ANGIOGRAPHY;  Surgeon: Nelva Bush, MD;  Location: [place] [facility];  Service: Cardiovascular;  Laterality: N/A;   STERNAL WOUND DEBRIDEMENT N/A 05/16/2022   Procedure: VAD DRIVELINE DEBRIDEMENT;  Surgeon: [name], MD;  [facility];  Service: Thoracic;  Laterality: N/A;  VANC PULSE LAVAGE   STERNAL WOUND DEBRIDEMENT Left 06/14/2022   Procedure: DEBRIDEMENT OF VAD POWERCORD TUNNEL;  Surgeon: [name], MD;  Location: [place];  Service: Thoracic;  Laterality: Left;   STERNAL WOUND DEBRIDEMENT N/A 06/27/2022   Procedure: ABDOMINAL WOUND DEBRIDEMENT, APPLICATION OF MYRIAD;  Surgeon: [name], MD;  Location: [place];  Service: Thoracic;  Laterality: N/A;   STERNAL WOUND DEBRIDEMENT N/A 07/08/2022   Procedure: ABDOMINAL WOUND DEBRIDEMENT;  Surgeon: [name], MD;  Location: [place];  Service: Thoracic;  Laterality: N/A;   STERNAL WOUND DEBRIDEMENT N/A 01/28/2023   Procedure: ABDOMINAL DRIVELINE WOUND DEBRIDEMENT;  Surgeon: [name], MD;  Location: [place];  Service: Thoracic;  Laterality: N/A;   STERNAL WOUND DEBRIDEMENT N/A 02/05/2023   Procedure: ABDOMINAL DRIVELINE WOUND DEBRIDEMENT;  Surgeon: [name], MD;  Location: [place];  Service: Thoracic;  Laterality: N/A;   TEE WITHOUT CARDIOVERSION N/A 11/24/2019   Procedure: TRANSESOPHAGEAL ECHOCARDIOGRAM (TEE);  Surgeon: Wonda Olds, MD;  Location: [place];  Service: Open Heart Surgery;  Laterality: N/A;   TEE WITHOUT CARDIOVERSION N/A 11/26/2019   Procedure: TRANSESOPHAGEAL ECHOCARDIOGRAM (TEE);  Surgeon: Wonda Olds, MD;  Location: [place];  Service: Open Heart Surgery;  Laterality: N/A;   TEE WITHOUT CARDIOVERSION N/A 11/10/2020   Procedure: TRANSESOPHAGEAL ECHOCARDIOGRAM (TEE);  Surgeon: Wonda Olds, MD;  Location: [place];  Service: Open Heart Surgery;  Laterality: N/A;   TEE WITHOUT CARDIOVERSION N/A 11/17/2020   Procedure:  TRANSESOPHAGEAL ECHOCARDIOGRAM (TEE);  Surgeon: Wonda Olds, MD;  Location: [place];  Service: Open Heart Surgery;  Laterality: N/A;   WOUND EXPLORATION N/A 05/31/2022   Procedure: WOUND EXPLORATION, Debridement and Irrigation of abdominal wound;  Surgeon: [name], MD;  [facility];  Service: Thoracic;  Laterality: N/A;   Family Psychiatric  History:  Social History:  Social History   Substance and Sexual Activity  Alcohol Use Not Currently   Comment: Prior history of excessive intake     Social History   Substance and Sexual Activity  Drug Use Yes   Types: Methamphetamines    Social History   Socioeconomic History   Marital status: Married    Spouse name: Not on file   Number of children: Not on file   Years of education: 12   Highest education level: Not on file  Occupational History   Not on file  Tobacco Use   Smoking status: Former    Packs/day: 1.00    Years: 30.00    Total pack years: 30.00    Types: Cigarettes    Quit date: 12/20/2019    Years since quitting: 3.1   Smokeless tobacco: Never  Vaping Use   Vaping Use: Never used  Substance and Sexual Activity   Alcohol use: Not Currently    Comment: Prior history of excessive intake   Drug use: Yes    Types: Methamphetamines   Sexual activity: Not on file  Other Topics Concern   Not on file  Social History Narrative   Patient was born and raised in [place]. He denies any issues with walking or talking as a child. He completed the 9th grade. He is currently financially supported through disability. He is married and he has four children he is estranged from. He is currently living with his wife.    Social Determinants of Health   Financial Resource Strain: Medium Risk (05/17/2022)   Overall Financial Resource Strain (CARDIA)    Difficulty of Paying Living Expenses: Somewhat hard  Food Insecurity: No Food Insecurity (02/05/2023)   Hunger Vital Sign    Worried About Running Out of Food in  the Last Year: Never true    Ran Out of Food in the Last Year: Never true  Transportation Needs: Unmet Transportation Needs (02/05/2023)   PRAPARE - Hydrologist (Medical): Yes    Lack of Transportation (Non-Medical): No  Physical Activity: Inactive (11/10/2020)   Exercise Vital Sign    Days of Exercise per Week: 0 days    Minutes of Exercise per Session: 0 min  Stress: Not on file  Social Connections: Not on file   Additional Social History:  Allergies:   Allergies  Allergen Reactions   Vancomycin Nausea And Vomiting and Other (See Comments)    Severe joint pain    Labs:  Results for orders placed or performed during the hospital encounter of 01/27/23 (from the past 48 hour(s))  Glucose, capillary     Status: Abnormal   Collection Time: 02/07/23  3:56 PM  Result Value Ref Range   Glucose-Capillary 147 (H) 70 - 99 mg/dL    Comment: Glucose reference range applies only to samples taken after fasting for at least 8 hours.  Glucose, capillary     Status: Abnormal   Collection Time: 02/07/23  8:45 PM  Result Value Ref Range   Glucose-Capillary 180 (H) 70 - 99 mg/dL    Comment: Glucose reference range applies only to samples taken after fasting for at least 8 hours.  Lactate dehydrogenase     Status: None   Collection Time: 02/08/23  6:16 AM  Result Value Ref Range   LDH 172 98 - 192 U/L    Comment: Performed at [facility], [place] [address], [place], [place] [address]  Protime-INR     Status: Abnormal   Collection Time: 02/08/23  6:16 AM  Result Value Ref Range   Prothrombin Time 18.0 (H) 11.4 - 15.2 seconds   INR 1.5 (H) 0.8 - 1.2    Comment: (NOTE) INR goal varies based on device and disease states. Performed at [facility], [place] [address] [place], [place] [id number]   Basic metabolic panel     Status: Abnormal   Collection Time: 02/08/23  6:16 AM  Result Value Ref Range   Sodium 137 135 - 145 mmol/L   Potassium 3.7 3.5 - 5.1  mmol/L   Chloride 103 98 - 111 mmol/L   CO2 23 22 - 32 mmol/L   Glucose, Bld 161 (H) 70 - 99 mg/dL    Comment: Glucose reference range applies only to samples taken after fasting for at least 8 hours.   BUN 18 8 - 23 mg/dL   Creatinine, Ser 1.18 0.61 - 1.24 mg/dL   Calcium 8.7 (L) 8.9 - 10.3 mg/dL   GFR, Estimated >60 >60 mL/min    Comment: (NOTE) Calculated using the CKD-EPI Creatinine Equation (2021)    Anion gap 11 5 - 15    Comment: Performed at [facility] [address] [place], [place] [address]  CBC     Status: Abnormal   Collection Time: 02/08/23  6:16 AM  Result Value Ref Range   WBC 8.5 4.0 - 10.5 K/uL   RBC 4.17 (L) 4.22 - 5.81 MIL/uL   Hemoglobin 11.7 (L) 13.0 - 17.0 g/dL   HCT 33.7 (L) 39.0 - 52.0 %   MCV 80.8 80.0 - 100.0 fL   MCH 28.1 26.0 - 34.0 pg   MCHC 34.7 30.0 - 36.0 g/dL   RDW 13.6 11.5 - 15.5 %   Platelets 284 150 - 400 K/uL   nRBC 0.0 0.0 - 0.2 %    Comment: Performed at [facility], [place] [address] [place], [place] [address]  Glucose, capillary     Status: Abnormal   Collection Time: 02/08/23  6:40 AM  Result Value Ref Range   Glucose-Capillary 154 (H) 70 - 99 mg/dL    Comment: Glucose reference range applies only to samples taken after fasting for at least 8 hours.  Glucose, capillary     Status: None   Collection Time: 02/08/23  11:37 AM  Result Value Ref Range   Glucose-Capillary 95 70 - 99 mg/dL    Comment: Glucose reference range applies only to samples taken after fasting for at least 8 hours.  Glucose, capillary     Status: Abnormal   Collection Time: 02/08/23  3:53 PM  Result Value Ref Range   Glucose-Capillary 101 (H) 70 - 99 mg/dL    Comment: Glucose reference range applies only to samples taken after fasting for at least 8 hours.  Heparin level (unfractionated)     Status: Abnormal   Collection Time: 02/08/23  6:11 PM  Result Value Ref Range   Heparin Unfractionated <0.10 (L) 0.30 - 0.70 IU/mL    Comment: (NOTE) The  clinical reportable range upper limit is being lowered to >1.10 to align with the FDA approved guidance for the current laboratory assay.  If heparin results are below expected values, and patient dosage has  been confirmed, suggest follow up testing of antithrombin III levels. Performed at [facility], [place] [address] [place], [place] [address]   Glucose, capillary     Status: Abnormal   Collection Time: 02/08/23  9:34 PM  Result Value Ref Range   Glucose-Capillary 148 (H) 70 - 99 mg/dL    Comment: Glucose reference range applies only to samples taken after fasting for at least 8 hours.   Comment 1 Notify RN    Comment 2 Document in Chart   Lactate dehydrogenase     Status: None   Collection Time: 02/09/23 12:34 AM  Result Value Ref Range   LDH 184 98 - 192 U/L    Comment: Performed at [facility], [place] [address] [place], [place] [address]  Protime-INR     Status: Abnormal   Collection Time: 02/09/23 12:34 AM  Result Value Ref Range   Prothrombin Time 20.7 (H) 11.4 - 15.2 seconds   INR 1.8 (H) 0.8 - 1.2    Comment: (NOTE) INR goal varies based on device and disease states. Performed at [facility], [place] [address], [place], [place] [id number]   Basic metabolic panel     Status: Abnormal   Collection Time: 02/09/23 12:34 AM  Result Value Ref Range   Sodium 137 135 - 145 mmol/L   Potassium 3.9 3.5 - 5.1 mmol/L   Chloride 106 98 - 111 mmol/L   CO2 24 22 - 32 mmol/L   Glucose, Bld 103 (H) 70 - 99 mg/dL    Comment: Glucose reference range applies only to samples taken after fasting for at least 8 hours.   BUN 18 8 - 23 mg/dL   Creatinine, Ser 1.35 (H) 0.61 - 1.24 mg/dL   Calcium 8.6 (L) 8.9 - 10.3 mg/dL   GFR, Estimated 59 (L) >60 mL/min    Comment: (NOTE) Calculated using the CKD-EPI Creatinine Equation (2021)    Anion gap 7 5 - 15    Comment: Performed at [facility] [address], [place], [place] [address]  CBC     Status: Abnormal    Collection Time: 02/09/23 12:34 AM  Result Value Ref Range   WBC 7.4 4.0 - 10.5 K/uL   RBC 3.66 (L) 4.22 - 5.81 MIL/uL   Hemoglobin 10.5 (L) 13.0 - 17.0 g/dL   HCT 30.2 (L) 39.0 - 52.0 %   MCV 82.5 80.0 - 100.0 fL   MCH 28.7 26.0 - 34.0 pg   MCHC 34.8 30.0 - 36.0 g/dL   RDW 13.6  11.5 - 15.5 %   Platelets 268 150 - 400 K/uL   nRBC 0.0 0.0 - 0.2 %    Comment: Performed at [facility], [place] [address] [place], [place] [address]  Heparin level (unfractionated)     Status: Abnormal   Collection Time: 02/09/23 12:34 AM  Result Value Ref Range   Heparin Unfractionated <0.10 (L) 0.30 - 0.70 IU/mL    Comment: (NOTE) The clinical reportable range upper limit is being lowered to >1.10 to align with the FDA approved guidance for the current laboratory assay.  If heparin results are below expected values, and patient dosage has  been confirmed, suggest follow up testing of antithrombin III levels. Performed at [facility], [place] [address], [place], [place] [address]   Glucose, capillary     Status: Abnormal   Collection Time: 02/09/23  6:13 AM  Result Value Ref Range   Glucose-Capillary 149 (H) 70 - 99 mg/dL    Comment: Glucose reference range applies only to samples taken after fasting for at least 8 hours.  Glucose, capillary     Status: Abnormal   Collection Time: 02/09/23 11:48 AM  Result Value Ref Range   Glucose-Capillary 109 (H) 70 - 99 mg/dL    Comment: Glucose reference range applies only to samples taken after fasting for at least 8 hours.    Current Facility-Administered Medications  Medication Dose Route Frequency Provider Last Rate Last Admin   acetaminophen (TYLENOL) tablet 650 mg  650 mg Oral Q4H PRN [name], MD   650 mg at 01/27/23 2128   allopurinol (ZYLOPRIM) tablet 100 mg  100 mg Oral Daily [name], MD   100 mg at 02/09/23 [id number]   amiodarone (PACERONE) tablet 200 mg  200 mg Oral BID [name], MD   200 mg at 02/09/23 0810    chlordiazePOXIDE (LIBRIUM) capsule 25 mg  25 mg Oral Q6H PRN Bensimhon, Shaune Pascal, MD       colchicine tablet 0.6 mg  0.6 mg Oral Daily PRN [name], MD       DAPTOmycin (CUBICIN) 700 mg in sodium chloride 0.9 % IVPB  8 mg/kg Intravenous Q2000 Laurice Record, MD 128 mL/hr at 02/08/23 2131 700 mg at 02/08/23 2131   feeding supplement (GLUCERNA SHAKE) (GLUCERNA SHAKE) liquid 237 mL  237 mL Oral TID WC [name], MD   237 mL at 02/09/23 0604   fluticasone (FLONASE) 50 MCG/ACT nasal spray 2 spray  2 spray Each Nare Daily PRN [name], MD   2 spray at 02/06/23 0635   glipiZIDE (GLUCOTROL) tablet 5 mg  5 mg Oral Q breakfast [name], MD   5 mg at 02/09/23 0606   guaiFENesin (MUCINEX) 12 hr tablet 600 mg  600 mg Oral BID [name], MD   600 mg at 02/09/23 0810   hydrALAZINE (APRESOLINE) injection 10 mg  10 mg Intravenous Q2H PRN [name], MD   10 mg at 02/09/23 0842   insulin aspart (novoLOG) injection 0-24 Units  0-24 Units Subcutaneous TID WC [name], MD   2 Units at 02/09/23 [id number]   insulin aspart (novoLOG) injection 0-5 Units  0-5 Units Subcutaneous QHS [name], MD   2 Units at 02/05/23 2111   ipratropium (ATROVENT) nebulizer solution 0.5 mg  0.5 mg Nebulization Q4H PRN [name], MD   0.5 mg at 02/07/23 1801   levalbuterol (XOPENEX) nebulizer solution 0.63 mg  0.63 mg Nebulization Q6H PRN [name], MD  0.63 mg at 02/09/23 0806   loratadine (CLARITIN) tablet 10 mg  10 mg Oral Daily [name], MD   10 mg at 02/09/23 0810   metFORMIN (GLUCOPHAGE) tablet 500 mg  500 mg Oral BID WC [name], MD   500 mg at 02/09/23 [id number]   metoprolol succinate (TOPROL-XL) 24 hr tablet 25 mg  25 mg Oral Daily [name], MD   25 mg at 02/09/23 0810   minocycline (MINOCIN) capsule 200 mg  200 mg Oral BID [name], MD   200 mg at 02/09/23 [id number]   multivitamin with minerals tablet 1 tablet  1 tablet Oral Q24H [name], MD   1  tablet at 02/08/23 1612   neomycin-bacitracin-polymyxin (NEOSPORIN) ointment   Topical Daily Larey Dresser, MD   Given at 02/09/23 [phone]   ondansetron [facility]) injection 4 mg  4 mg Intravenous Q6H PRN [name], MD   4 mg at 02/09/23 [id number]   Oral care mouth rinse  15 mL Mouth Rinse PRN [name], MD       pantoprazole (PROTONIX) EC tablet 40 mg  40 mg Oral Daily [name], MD   40 mg at 02/09/23 0810   sertraline (ZOLOFT) tablet 50 mg  50 mg Oral Daily [name], MD   50 mg at 02/09/23 0810   tamsulosin (FLOMAX) capsule 0.4 mg  0.4 mg Oral Daily [name], MD   0.4 mg at 02/09/23 [id number]   thiamine (VITAMIN B1) tablet 100 mg  100 mg Oral Daily Bensimhon, Shaune Pascal, MD   100 mg at 02/09/23 0810   traZODone (DESYREL) tablet 150 mg  150 mg Oral QHS [name], MD   150 mg at 02/08/23 2125   warfarin (COUMADIN) tablet 5 mg  5 mg Oral ONCE-1600 [place], [place], [facility]       Warfarin - Pharmacist Dosing Inpatient   Does not apply [id number] [name], MD   Given at 02/08/23 1600   zinc sulfate capsule 220 mg  220 mg Oral Daily [name], MD   220 mg at 02/09/23 0810    Musculoskeletal: Strength & Muscle Tone: within normal limits Gait & Station: normal  Psychiatric Specialty Exam:  Presentation  General Appearance:  Appropriate for Environment  Eye Contact: Fair  Speech: Normal Rate  Speech Volume: Normal  Handedness:No data recorded  Mood and Affect  Mood: Depressed  Affect: Depressed   Thought Process  Thought Processes: Coherent  Descriptions of Associations:Intact  Orientation:Full (Time, Place and Person)  Thought Content:Logical  History of Schizophrenia/Schizoaffective disorder:No data recorded Duration of Psychotic Symptoms:No data recorded Hallucinations:Hallucinations: None  Ideas of Reference:None  Suicidal Thoughts:Suicidal Thoughts: No  Homicidal Thoughts:Homicidal Thoughts: No   Sensorium  Memory:No data  recorded Judgment: Good  Insight: Good   Executive Functions  Concentration: Good  Attention Span: Good  Recall: Good  Fund of Knowledge: Good  Language: Good   Psychomotor Activity  Psychomotor Activity:No data recorded  Assets  Assets:No data recorded  Sleep  Sleep: Sleep: Poor   Physical Exam: Physical Exam ROS Blood pressure (!) 153/89, pulse 92, temperature 98 F (36.7 C), temperature source Oral, resp. rate 20, height '5\' 10"'$  (1.778 m), weight 85.8 kg, SpO2 98 %. Body mass index is 27.14 kg/m.  Treatment Plan Summary: Daily contact with patient to assess and evaluate symptoms and progress in treatment  Depression -Recommend Initiating Remeron 15 mg PO QHS to assist with sleep, appetite, and mood. This medication has also been shown to  assist with methamphetamine abuse  Stimulant Use Disorder Amphetamine Type Substance -Please provide patient with resources for residential or outpatient substance abuse rehab programs  Disposition: No evidence of imminent risk to self or others at present.   Patient does not meet criteria for psychiatric inpatient admission. Supportive therapy provided about ongoing stressors.  Pecolia Ades, DO 02/09/2023 1:12 PM

## 2023-02-09 NOTE — Progress Notes (Signed)
Patient very anxious and tearful about his living situations. Pt stated feeling depressed but not suicidal. Doppler also reading 160 and bp elevated. Scheduled medications administered. Pt began vomiting. PRN Hydralazine & Zofran administered.

## 2023-02-10 ENCOUNTER — Encounter (HOSPITAL_COMMUNITY): Payer: 59

## 2023-02-10 ENCOUNTER — Encounter (HOSPITAL_COMMUNITY): Payer: 59 | Admitting: Cardiology

## 2023-02-10 DIAGNOSIS — T829XXD Unspecified complication of cardiac and vascular prosthetic device, implant and graft, subsequent encounter: Secondary | ICD-10-CM | POA: Diagnosis not present

## 2023-02-10 LAB — CBC
HCT: 35.2 % — ABNORMAL LOW (ref 39.0–52.0)
Hemoglobin: 11.8 g/dL — ABNORMAL LOW (ref 13.0–17.0)
MCH: 27.9 pg (ref 26.0–34.0)
MCHC: 33.5 g/dL (ref 30.0–36.0)
MCV: 83.2 fL (ref 80.0–100.0)
Platelets: 320 10*3/uL (ref 150–400)
RBC: 4.23 MIL/uL (ref 4.22–5.81)
RDW: 13.8 % (ref 11.5–15.5)
WBC: 7.5 10*3/uL (ref 4.0–10.5)
nRBC: 0 % (ref 0.0–0.2)

## 2023-02-10 LAB — BASIC METABOLIC PANEL
Anion gap: 10 (ref 5–15)
BUN: 14 mg/dL (ref 8–23)
CO2: 22 mmol/L (ref 22–32)
Calcium: 9.1 mg/dL (ref 8.9–10.3)
Chloride: 103 mmol/L (ref 98–111)
Creatinine, Ser: 1.15 mg/dL (ref 0.61–1.24)
GFR, Estimated: >60 mL/min (ref >60)
Glucose, Bld: 120 mg/dL — ABNORMAL HIGH (ref 70–99)
Potassium: 3.8 mmol/L (ref 3.5–5.1)
Sodium: 135 mmol/L (ref 135–145)

## 2023-02-10 LAB — PROTIME-INR
INR: 1.9 — ABNORMAL HIGH (ref 0.8–1.2)
Prothrombin Time: 21.9 seconds — ABNORMAL HIGH (ref 11.4–15.2)

## 2023-02-10 LAB — MISC LABCORP TEST (SEND OUT): Labcorp test code: 96388

## 2023-02-10 LAB — HEPARIN LEVEL (UNFRACTIONATED): Heparin Unfractionated: <0.1 IU/mL — ABNORMAL LOW (ref 0.30–0.70)

## 2023-02-10 LAB — GLUCOSE, CAPILLARY
Glucose-Capillary: 143 mg/dL — ABNORMAL HIGH (ref 70–99)
Glucose-Capillary: 71 mg/dL (ref 70–99)
Glucose-Capillary: 181 mg/dL — ABNORMAL HIGH (ref 70–99)
Glucose-Capillary: 186 mg/dL — ABNORMAL HIGH (ref 70–99)

## 2023-02-10 LAB — LACTATE DEHYDROGENASE: LDH: 166 U/L (ref 98–192)

## 2023-02-10 LAB — MAGNESIUM: Magnesium: 1.9 mg/dL (ref 1.7–2.4)

## 2023-02-10 MED ORDER — WARFARIN SODIUM 5 MG PO TABS
5.0000 mg | ORAL_TABLET | Freq: Once | ORAL | Status: AC
  Administered 2023-02-10: 5 mg via ORAL
  Filled 2023-02-10: qty 1

## 2023-02-10 NOTE — Progress Notes (Signed)
5 Days Post-Op Procedure(s) (LRB): ABDOMINAL DRIVELINE WOUND DEBRIDEMENT (N/A) WOUND VAC CHANGE (N/A) Subjective: Patient appears to have depressed affect but oriented and appropriate. Wound VAC sponges compressed and system is functioning although minimal serosanguineous drainage has been removed.  Operative cultures show persistent MRSA infection.  Plan scheduled wound VAC change with wound washout on Wednesday, March 6.  Patient understands the plan for surgery.  Objective: Vital signs in last 24 hours: Temp:  [97.6 F (36.4 C)-98 F (36.7 C)] 98 F (36.7 C) (03/04 0826) Pulse Rate:  [92-95] 95 (03/04 0826) Cardiac Rhythm: Normal sinus rhythm (03/04 0830) Resp:  [15-20] 15 (03/04 0826) BP: (85-153)/(40-100) 85/40 (03/04 0826) SpO2:  [96 %-98 %] 96 % (03/04 0826) Weight:  [82.5 kg] 82.5 kg (03/04 0641)  Hemodynamic parameters for last 24 hours:  Afebrile Sinus rhythm  Intake/Output from previous day: 03/03 0701 - 03/04 0700 In: 100 [P.O.:100] Out: 2350 [Urine:2350] Intake/Output this shift: Total I/O In: -  Out: 800 [Urine:800]       Exam    General- alert and comfortable but not engaged and appears depressed Wound VAC system functioning well.  Minimal serosanguineous drainage in canister    Neck- no JVD, no cervical adenopathy palpable, no carotid bruit   Lungs- clear without rales, wheezes   Cor- regular rate and rhythm, normal VAD hum   Abdomen- soft, non-tender   Extremities - warm, non-tender, minimal edema   Neuro- oriented, appropriate, no focal weakness   Lab Results: Recent Labs    02/09/23 0034 02/10/23 0021  WBC 7.4 7.5  HGB 10.5* 11.8*  HCT 30.2* 35.2*  PLT 268 320   BMET:  Recent Labs    02/09/23 0034 02/10/23 0021  NA 137 135  K 3.9 3.8  CL 106 103  CO2 24 22  GLUCOSE 103* 120*  BUN 18 14  CREATININE 1.35* 1.15  CALCIUM 8.6* 9.1    PT/INR:  Recent Labs    02/10/23 0021  LABPROT 21.9*  INR 1.9*   ABG    Component Value  Date/Time   PHART 7.508 (H) 11/20/2020 0438   HCO3 24.8 09/09/2021 0313   TCO2 29 11/20/2020 0438   ACIDBASEDEF 6.0 (H) 11/24/2019 1718   O2SAT 63.4 09/09/2021 0313   CBG (last 3)  Recent Labs    02/09/23 1630 02/09/23 2101 02/10/23 0640  GLUCAP 124* 114* 143*    Assessment/Plan: S/P Procedure(s) (LRB): ABDOMINAL DRIVELINE WOUND DEBRIDEMENT (N/A) WOUND VAC CHANGE (N/A) Plan abdominal wound irrigation and wound VAC change in OR Wednesday, March 6. Continue Coumadin for INR 2.0-2.4   LOS: 14 days    [name] 02/10/2023

## 2023-02-10 NOTE — Progress Notes (Signed)
[place] for  warfarin  Indication: LVAD  Allergies  Allergen Reactions   Vancomycin Nausea And Vomiting and Other (See Comments)    Severe joint pain    Patient Measurements: Height: '5\' 10"'$  (177.8 cm) Weight: 82.5 kg (181 lb 14.1 oz) IBW/kg (Calculated) : 73   Vital Signs: Temp: 98 F (36.7 C) (03/04 1123) Temp Source: Oral (03/04 1123) BP: 82/66 (03/04 1123) Pulse Rate: 96 (03/04 1123)  Labs: Recent Labs    02/08/23 0616 02/08/23 1811 02/09/23 0034 02/10/23 0021  HGB 11.7*  --  10.5* 11.8*  HCT 33.7*  --  30.2* 35.2*  PLT 284  --  268 320  LABPROT 18.0*  --  20.7* 21.9*  INR 1.5*  --  1.8* 1.9*  HEPARINUNFRC  --  <0.10* <0.10* <0.10*  CREATININE 1.18  --  1.35* 1.15     Estimated Creatinine Clearance: 68.8 mL/min (by C-G formula based on SCr of 1.15 mg/dL).   Medical History: Past Medical History:  Diagnosis Date   "    Arthritis    CAD (coronary artery disease)    a. s/p CABG in 11/2019 with LIMA-LAD, SVG-OM1, SVG-PDA and SVG-D1   CHF (congestive heart failure) (HCC)    a. EF < 20% by echo in 11/2019   Essential hypertension    PAF (paroxysmal atrial fibrillation) (HCC)    Type 2 diabetes mellitus (HCC)     Medications:   DAPTOmycin (CUBICIN) 700 mg in sodium chloride 0.9 % IVPB 700 mg (02/09/23 2109)    Assessment: 62 yo male on chronic Coumadin for LVAD, poor compliance with outpatient checks.  INR on admission 1.1. S/p OR 2/20 for drive line infection debridement.   Previous Coumadin dose 6 mg daily except 4 mg on Tues, Thurs.  INR up 1.5>1.9 after restarting warfarin - pt refused dose last pm Hgb stable 10-11 plt stable 240-300  LDH stable at 149. No s/sx of bleeding. Underwent I&D with wound VAC change 2/28 - next plan for 3/6.    Goal of Therapy:  INR 2-2.3 (keeping low in anticipation of return to OR soon) Monitor platelets by anticoagulation protocol: Yes   Plan:  Warfarin '5mg'$ x1 tonight   Daily protime and cbc    Bonnita Nasuti Pharm.D. CPP, BCPS Clinical Pharmacist [phone] 02/10/2023 2:12 PM     [facility] pharmacy phone numbers are listed on [email or web address]

## 2023-02-10 NOTE — Discharge Summary (Addendum)
Advanced Heart Failure Team  Discharge Summary   Patient ID: Kevin Underwood. MRN: [id number], DOB/AGE: 08-06-1961 62 y.o. 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 type 2 diabetes mellitus, COPD, and hypertension. Now s/p Heartmate 3 LVAD.   Has had multiple admits with recurrent DL infections, persistent MRSA.   Presented to the ED with suspected DL infection. Had been off his medications for ~3 days and had recently gone on a 5 day meth binge. 2 days prior to coming he noticed an odor and drainage around DL site. CT abd--> small amount of fluid along LVAD driveline in upper abdomin.  Admitted for DL debridement and wound vac placement.   Went to the OR for debridement and wound vac changes around DL: 2/20, 2/28, and 3/6. Wound vac removed 3/6. Now being packed/dressing changed per LVAD team.   Psych saw while admitted 2/2 depression, started on medications.   Admission c/b hypertension/hypotension. Suspect some was from withdrawal but mostly from anxiety. Hypotension typically d/t decreased PO intake. Typically resolved with PRN meds. Now off most long-acting antihypertensives with fluctuations.   Best option for patient was SNF. Patient agreeable and looking forward to SNF placement. Plan for daptomycin/minocycline for driveline infection until 4/2 then minocycline alone long-term. VAD coordinator will deliver dressing supplies (25 daily dressings, 15 anchors, 4 x 4s, 2 x 2s, cotton tip applicators, large  tegaderms, and large bottle of VASHE solution to [facility] for facility use for daily dressing changes.   Pt will continue to be followed closely in the VAD/HF clinic. Dr Aundra Dubin evaluated and deemed appropriate for discharge.    See below for detailed problem list:  1. LVAD Complication---> Recurrent driveline infection: MRSA driveline infection, admitted in 6/23 and again in 7/23-8/23 with multiple trips to the OR for debridement. He had completed vancomycin and then long-term doxycycline. Most recent culture PTA grew acinetobacter and MRSA. ID started minocycline. He had been off minocycline for at least 3 days prior to this admit w/ copious drainage around driveline. CT chest/abd/pelvis showed stranding around the driveline and small amount of fluid. Started on Vanc and Meropenum per ID. Now off meropenem (2/21).  OR for debridement w/ wound vac placement 2/20. Wound culture with MRSA, blood cultures negative.  Afebrile.  - Will need 6 wks abx. Vanc switched to dapto 2/27 with nausea. Now on dapto/minocycline. Once dapto complete continue suppressive minocycline   - Returned to OR for I&D and wound vac removal 3/6. Site looks ok today 2. Chronic HFrEF: Echo 10/21 with EF 20-25%, mildly decreased RV function. LHC/RHC in 12/21 with patent grafts, low output. Suspect mixed ischemic/nonischemic cardiomyopathy (prior heavy ETOH and drugs as well as CAD).  No ETOH, drugs, smoking since CABG in 12/20. Admitted with cardiogenic shock in 12/21, had placement of Impella 5.5 initially, now s/p Heartmate 3 LVAD on 11/17/20.   - Volume status stable. Avoid diuretics  - INR 2.2 . Discussed dosing with PharmD personally. Adjusting warfarin dose.  - Not a candidate for transplant with ongoing drug use.  3.  HTN: MAPs stable today.  Can continue Toprol XL 25 mg daily  4. . Polysubstance Abuse: Recently started using methamphetamine again.  UDS + for amphetamines.  - SW team following. Unfortunately, limited  resources for inpatient substance use due LVAD - Having paroxysms of n/v. He believes it is all due to anxiety.  - Psych saw patient 3/3, now on remeron and klonopin 5. PAF: S/p Maze and LA appendage clip with CABG in 12/20.  NSR this admission.  6. 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    - Continue scheduled atrovent.  - Levalbuterol prn.  - Have avoided prednisone with surgery at driveline site for recurrent infection.   7. DMII - SSI  8.  NSVT/PVCs: Frequent NSVT 2/23, may have been dry after diuresis.  Amiodarone gtt started to suppress.  - Continue po amiodarone 200 mg bid, plan to reevaluate at f/u - Would eventually stop this medication, avoid with COPD.   9. Nausea/vomiting - Began 2/27. Assumed 2/2 vancomycin initially however vanc switched to daptomycin and nausea continues.  - KUB ok - suspect etiology due to severe emotional distress/anxiety +/- substance abuse WD  10. Major depression - Patient refusing to work with PT, not motivated and expresses frustration with his overall situation.  Ongoing drug use, no longer living with his wife.  Has been living with his mother who cannot care for him.  - Psych consulted and saw patient, now on remeron 11. AKI - sCr Resolved. Stable today.  Now off losartan. Increasing PO intake 12. Dispo - SNF placement ([facility]), plan for d/c today  LVAD Interrogation HM II:   Speed: 5600   Flow:  4.7 PI: 2.8 Power: 4.1  Back-up speed:  5300     Discharge Weight Range: 86kg Discharge Vitals: Blood pressure 98/81, pulse 80, temperature 97.8 F (36.6 C), temperature source Oral, resp. rate 19, height 5\' 10"  (1.778 m), weight 86 kg, SpO2 97 %.  Labs: Lab Results  Component Value Date   WBC 6.7 02/20/2023   HGB 10.3 (L) 02/20/2023   HCT 30.5 (L) 02/20/2023   MCV 85.7 02/20/2023   PLT 200 02/20/2023    Recent Labs  Lab 02/20/23 0545  NA 136  K 4.2  CL 102  CO2 26  BUN 23  CREATININE 1.23  CALCIUM 8.9  GLUCOSE 125*   Lab Results   Component Value Date   CHOL 142 09/11/2021   HDL 62 09/11/2021   LDLCALC 61 09/11/2021   TRIG 95 09/11/2021   BNP (last 3 results) Recent Labs    05/17/22 0153  BNP 222.1*    ProBNP (last 3 results) No results for input(s): "PROBNP" in the last 8760 hours.   Diagnostic Studies/Procedures   No results found.  Discharge Medications   Allergies as of 02/20/2023       Reactions   Vancomycin Nausea And Vomiting, Other (See Comments)   Severe joint pain        Medication List     STOP taking these medications    acetaminophen 650 MG CR tablet Commonly known as: TYLENOL Replaced by: acetaminophen 325 MG tablet   enoxaparin 40 MG/0.4ML injection Commonly known as: LOVENOX   Entresto 24-26 MG Generic drug: sacubitril-valsartan   furosemide 40 MG tablet Commonly known as: LASIX   gabapentin 600 MG tablet Commonly known as: NEURONTIN   hydrALAZINE 25 MG tablet Commonly known as: APRESOLINE   traMADol 50 MG tablet Commonly known as: ULTRAM       TAKE  these medications    acetaminophen 325 MG tablet Commonly known as: TYLENOL Take 2 tablets (650 mg total) by mouth every 4 (four) hours as needed for headache or mild pain. Replaces: acetaminophen 650 MG CR tablet   albuterol 108 (90 Base) MCG/ACT inhaler Commonly known as: VENTOLIN HFA Inhale 2 puffs into the lungs every 6 (six) hours as needed for wheezing or shortness of breath.   albuterol (2.5 MG/3ML) 0.083% nebulizer solution Commonly known as: PROVENTIL INHALE 3 ML BY NEBULIZATION EVERY 6 HOURS AS NEEDED FOR WHEEZING OR SHORTNESS OF BREATH   allopurinol 100 MG tablet Commonly known as: ZYLOPRIM Take 1 tablet (100 mg total) by mouth daily.   amiodarone 200 MG tablet Commonly known as: PACERONE Take 1 tablet (200 mg total) by mouth 2 (two) times daily.   clonazePAM 0.25 MG disintegrating tablet Commonly known as: KLONOPIN Take 1 tablet (0.25 mg total) by mouth 2 (two) times daily.    colchicine 0.6 MG tablet Take 1 tablet (0.6 mg total) by mouth daily as needed (gout pain). What changed:  when to take this reasons to take this   Combivent Respimat 20-100 MCG/ACT Aers respimat Generic drug: Ipratropium-Albuterol Inhale 1 puff into the lungs every 6 (six) hours.   daptomycin  IVPB Commonly known as: CUBICIN Inject 700 mg into the vein daily for 20 days. Indication:  MRSA driveline infection  First Dose: Yes Last Day of Therapy:  03/11/23  Labs - Once weekly:  CBC/D, BMP, and CPK Labs - Every other week:  ESR and CRP   feeding supplement (GLUCERNA SHAKE) Liqd Take 237 mLs by mouth 3 (three) times daily with meals.   Flonase Allergy Relief 50 MCG/ACT nasal spray Generic drug: fluticasone Place 2 sprays into both nostrils as needed for allergies or rhinitis.   glipiZIDE 5 MG tablet Commonly known as: Glucotrol Take 1 tablet (5 mg total) by mouth daily.   loratadine 10 MG tablet Commonly known as: CLARITIN Take 1 tablet (10 mg total) by mouth daily.   metFORMIN 1000 MG tablet Commonly known as: GLUCOPHAGE Take 1,000 mg by mouth 2 (two) times daily.   metFORMIN 500 MG tablet Commonly known as: GLUCOPHAGE TAKE 1 TABLET BY MOUTH 2 TIMES DAILY WITH A MEAL.   methimazole 5 MG tablet Commonly known as: TAPAZOLE TAKE 1 TABLET BY MOUTH EVERY DAY WITH BREAKFAST What changed: See the new instructions.   metoprolol succinate 25 MG 24 hr tablet Commonly known as: Toprol XL Take 1 tablet (25 mg total) by mouth daily.   minocycline 100 MG capsule Commonly known as: Minocin Take 2 capsules (200 mg total) by mouth 2 (two) times daily.   mirtazapine 15 MG tablet Commonly known as: REMERON Take 1 tablet (15 mg total) by mouth at bedtime.   multivitamin tablet Take 1 tablet by mouth daily.   multivitamin with minerals Tabs tablet Take 1 tablet by mouth daily.   pantoprazole 40 MG tablet Commonly known as: PROTONIX TAKE 1 TABLET BY MOUTH EVERY DAY    sertraline 50 MG tablet Commonly known as: ZOLOFT Take 1 tablet (50 mg total) by mouth daily.   tamsulosin 0.4 MG Caps capsule Commonly known as: FLOMAX TAKE 1 CAPSULE BY MOUTH EVERY DAY   thiamine 100 MG tablet Commonly known as: Vitamin B-1 Take 1 tablet (100 mg total) by mouth daily.   traZODone 150 MG tablet Commonly known as: DESYREL Take 1 tablet (150 mg total) by mouth at bedtime.   Trelegy Ellipta 100-62.5-25 MCG/ACT  Aepb Generic drug: Fluticasone-Umeclidin-Vilant Inhale 1 puff into the lungs daily.   warfarin 4 MG tablet Commonly known as: COUMADIN Take as directed. If you are unsure how to take this medication, talk to your nurse or doctor. Original instructions: Take 1-1.5 tablets (4-6 mg total) by mouth See admin instructions. 4 mg every Tuesday and Thursday. 6 mg all other days   zinc sulfate 220 (50 Zn) MG capsule Take 1 capsule (220 mg total) by mouth daily.               Home Infusion Instuctions  (From admission, onward)           Start     Ordered   02/20/23 0000  Home infusion instructions       Question:  Instructions  Answer:  Flushing of vascular access device: 0.9% NaCl pre/post medication administration and prn patency; Heparin 100 u/ml, 25ml for implanted ports and Heparin 10u/ml, 21ml for all other central venous catheters.   02/20/23 0818            Disposition   The patient will be discharged in stable condition to home. Discharge Instructions     Home infusion instructions   Complete by: As directed    Instructions: Flushing of vascular access device: 0.9% NaCl pre/post medication administration and prn patency; Heparin 100 u/ml, 79ml for implanted ports and Heparin 10u/ml, 9ml for all other central venous catheters.          Duration of Discharge Encounter: Greater than 35 minutes   Signed, Forestine Na, AGACNP-BC  02/20/2023, 8:21 AM

## 2023-02-10 NOTE — Progress Notes (Signed)
Patient ID: Kevin Millers., male   DOB: Dec 25, 1960, 62 y.o.   MRN: [id number]   Advanced Heart Failure VAD Team Note  PCP-Cardiologist: Rozann Lesches, MD  AHF: Dr. 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 On IV vancomycin.  OR for debridement w/ wound vac placement 2/20 and 2/28.  Psych consulted and saw patient 3/3 for suicidal ideation/depression. Patient now on Remeron.   This morning affect flat. Denies CP/SOB. So far having an ok day. No complaints   LVAD INTERROGATION:  HeartMate III LVAD:   Flow 4.7 liters/min, speed 5600, power 4.0, PI 3.7 . 77 PI events.   Objective:    Vital Signs:   Temp:  [97.6 F (36.4 C)-98 F (36.7 C)] 97.8 F (36.6 C) (03/04 0641) Pulse Rate:  [81-92] 92 (03/03 1150) Resp:  [15-20] 16 (03/04 0641) BP: (94-169)/(78-112) 114/92 (03/04 0641) SpO2:  [97 %-98 %] 98 % (03/04 0641) Weight:  [82.5 kg-85.8 kg] 82.5 kg (03/04 0641) Last BM Date : 02/07/23 Mean arterial Pressure 80s  Intake/Output:   Intake/Output Summary (Last 24 hours) at 02/10/2023 [id number] Last data filed at 02/09/2023 2114 Gross per 24 hour  Intake 100 ml  Output 2350 ml  Net -2250 ml    Physical Exam  General:  Well appearing. No resp difficulty HEENT: Normal Neck: supple. No JVP. Carotids 2+ bilat; no bruits. No lymphadenopathy or thyromegaly appreciated. Cor: Mechanical heart sounds with LVAD hum present. Lungs: Clear Abdomen: soft, nontender, nondistended. No hepatosplenomegaly. No bruits or masses. Good bowel sounds. Driveline: C/D/I; securement device intact. Woun vac in place, good suction Extremities: no cyanosis, clubbing, rash, edema Neuro: alert & orientedx3, cranial nerves grossly intact. moves all 4 extremities w/o difficulty. Affect pleasant   Telemetry   NSR 90s + PVCs (Personally reviewed)    Labs   Basic Metabolic Panel: Recent Labs  Lab 02/04/23 0017 02/05/23 0701 02/06/23 0017 02/07/23 [id number] 02/08/23 0616  02/09/23 0034 02/10/23 0021  NA 134*   < > 134* 133* 137 137 135  K 4.0   < > 3.5 3.8 3.7 3.9 3.8  CL 98   < > 101 101 103 106 103  CO2 23   < > '26 22 23 24 22  '$ GLUCOSE 110*   < > 167* 145* 161* 103* 120*  BUN 26*   < > 29* '17 18 18 14  '$ CREATININE 1.12   < > 1.47* 1.19 1.18 1.35* 1.15  CALCIUM 8.5*   < > 8.3* 8.6* 8.7* 8.6* 9.1  MG 1.8  --   --   --   --   --  1.9   < > = values in this interval not displayed.   Liver Function Tests: No results for input(s): "AST", "ALT", "ALKPHOS", "BILITOT", "PROT", "ALBUMIN" in the last 168 hours.  CBC: Recent Labs  Lab 02/05/23 0701 02/06/23 0017 02/08/23 0616 02/09/23 0034 02/10/23 0021  WBC 5.8 6.2 8.5 7.4 7.5  HGB 11.6* 10.5* 11.7* 10.5* 11.8*  HCT 33.5* 29.7* 33.7* 30.2* 35.2*  MCV 81.7 80.9 80.8 82.5 83.2  PLT 245 203 284 268 320   INR: Recent Labs  Lab 02/06/23 0017 02/07/23 0608 02/08/23 0616 02/09/23 0034 02/10/23 0021  INR 2.4* 1.8* 1.5* 1.8* 1.9*    Other results:  Imaging   No results found.  Medications:     Scheduled Medications:  allopurinol  100 mg Oral Daily   amiodarone  200 mg Oral BID  feeding supplement (GLUCERNA SHAKE)  237 mL Oral TID WC   glipiZIDE  5 mg Oral Q breakfast   guaiFENesin  600 mg Oral BID   insulin aspart  0-24 Units Subcutaneous TID WC   insulin aspart  0-5 Units Subcutaneous QHS   loratadine  10 mg Oral Daily   metFORMIN  500 mg Oral BID WC   metoprolol succinate  25 mg Oral Daily   minocycline  200 mg Oral BID   mirtazapine  15 mg Oral QHS   multivitamin with minerals  1 tablet Oral Q24H   neomycin-bacitracin-polymyxin   Topical Daily   pantoprazole  40 mg Oral Daily   sertraline  50 mg Oral Daily   tamsulosin  0.4 mg Oral Daily   thiamine  100 mg Oral Daily   traZODone  150 mg Oral QHS   warfarin  5 mg Oral ONCE-1600   Warfarin - Pharmacist Dosing Inpatient   Does not apply q1600   zinc sulfate  220 mg Oral Daily    Infusions:  DAPTOmycin (CUBICIN) 700 mg in  sodium chloride 0.9 % IVPB 700 mg (02/09/23 2109)    PRN Medications: acetaminophen, chlordiazePOXIDE, colchicine, fluticasone, hydrALAZINE, ipratropium, levalbuterol, ondansetron (ZOFRAN) IV, mouth rinse  Patient Profile  62 y.o. 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 type 2 diabetes mellitus, COPD, hypertension, and HMIII VAD.    Admitted with recurrent LVAD complication driveline infection + suspected AECOPD Assessment/Plan:   1. LVAD Complication---> Recurrent driveline infection: MRSA driveline infection, admitted in 6/23 and again in 7/23-8/23 with multiple trips to the OR for debridement. He had completed vancomycin and then long-term doxycycline. Most recent culture PTA grew acinetobacter and MRSA. ID started minocycline. He had been off minocycline for at least 3 days prior to this admit w/ copious drainage around driveline. CT chest/abd/pelvis showed stranding around the driveline and small amount of fluid. Started on Vanc and Meropenum per ID. Now off meropenem (2/21).  OR for debridement w/ wound vac placement 2/20. Wound culture with MRSA, blood cultures negative.  Afebrile.  - Will need 6 wks abx. Vanc switched to dapto 2/27 with nausea. Now on dapto/minocycline. Once dapto complete continue suppressive minocycline   - Currently AF. Site looks ok  - Plan to return to OR for washout/vac change 3/6. Keep INR 1.8-2.2 until then.  2. Chronic HFrEF: Echo 10/21 with EF 20-25%, mildly decreased RV function. LHC/RHC in 12/21 with patent grafts, low output. Suspect mixed ischemic/nonischemic cardiomyopathy (prior heavy ETOH and drugs as well as CAD).  No ETOH, drugs, smoking since CABG in 12/20. Admitted with cardiogenic shock in 12/21, had placement of Impella 5.5 initially, now s/p Heartmate 3 LVAD on 11/17/20.  Ramp echo 2/23  with speed decreased to 5500 rpm.  - Volume status ok. Po intake has been poor but received 1L IVF yesterday - Warfarin restarted, goal INR 1.8-2.2 until surgery. INR 1.9. Now off low-dose heparin Discussed dosing with PharmD personally. - Not a candidate for transplant with ongoing drug use.  3. HTN: MAPs elevated low 100s on admit, poor compliance with meds. Has fluctuated significantly while admitted. MAPs elevated this am in setting of emotional distress - Can continue Toprol XL 25 mg daily  - Entresto stopped due to lower Maps.   - BP more stable on hydralazine PRN - Has spikes of HTN with  n/v. ? Drug withdrawal 4. Polysubstance Abuse: Recently started using methamphetamine again.  UDS + for amphetamines.  - SW team following. Unfortunately, limited resources for inpatient substance use due LVAD - Having paroxysms of n/v and HTN. suspect withdrawal from meth. - Will continue CIWA protocol for now  - Psych saw patient 3/3, now on remeron 5. PAF: S/p Maze and LA appendage clip with CABG in 12/20.  NSR this admission.  6. 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  Still some wheezing but has improved.   - Continue scheduled atrovent.  - Levalbuterol prn.  - Have avoided prednisone with surgery at driveline site for recurrent infection.   7. DMII - SSI  8.  NSVT/PVCs: Frequent NSVT 2/23, may have been dry after diuresis.  Amiodarone gtt started to suppress.  - Continue po amiodarone 200 mg bid.  - Would eventually stop this medication, avoid with COPD.   9. Nausea/vomiting - Began 2/27. Assumed 2/2 vancomycin initially however vanc switched to daptomycin and nausea continues.  - KUB ok - suspect etiology due to severe emotional distress/anxiety +/- substance abuse WD  - Continue CIWA until Psych can see 10. Major depression - Patient refusing to work with PT, not motivated and expresses frustration with his overall situation.  Ongoing drug use, no longer living with his wife.  Has been living with  his mother who cannot care for him.  - Now saying that he would like his VAD to be off so that his wife and mother don't have him in common any more  - Psych consulted and saw patient, now on remeron  I reviewed the LVAD parameters from today, and compared the results to the patient's prior recorded data.  No programming changes were made.  The LVAD is functioning within specified parameters.  The patient performs LVAD self-test daily.  LVAD interrogation was negative for any significant power changes, alarms or PI events/speed drops.  LVAD equipment check completed and is in good working order.  Back-up equipment present.   LVAD education done on emergency procedures and precautions and reviewed exit site care.  Length of Stay: [place], NP 02/10/2023, 7:12 AM  VAD Team --- VAD ISSUES ONLY--- Pager [phone] (7am - 7am)  Advanced Heart Failure Team  Pager [phone] (M-F; 7a - 5p)  Please contact [facility] for night-coverage after hours (5p -7a ) and weekends on [email or web address]

## 2023-02-10 NOTE — Progress Notes (Signed)
LVAD Coordinator Rounding Note:  Admitted 01/27/23 to Heart Failure service from ED due to driveline infection. Started on IV antibiotics.   S/p driveline debridement in OR 2/21 & 2/28. Wound vac dressing clean, dry, intact. Scant serosanguinous drainage in canister. Plan to return to OR for washout 02/12/23 per Dr Prescott Gum.   Pt laying in bed. Tells me that he had a rough weekend. Psych consulted over the weekend for suicidal ideation/depression. Patient now on Remeron.  Pysch documented that pt was not a threat to himself or others.    Pt will need wound vac and home IV antibiotics at discharge. Pt will need placement in SNF for IV antibiotics at discharge as he is living with his mom and she is unable to help him with care/medication administration. VAD coordinator spoke with his wife last week and they are not currently getting along. It would be much safer for him to be in a SNF post discharge for IV antibiotics. Inpatient social work awaiting bed offers for placement. Pt has been denied admission to [facility], [facility], and [facility]. Search for placement ongoing.   Controller electively exchanged this admission. No further low voltage hazard alarms seen on interrogation.  Vital signs: Temp:  98 HR: 95 Doppler Pressure: 84 Automatic BP: 114/92 (100) O2 Sat: 98% on RA Wt: 192.9>197>195.9>190.9>187.6>188.9>184.1>189.6>182.3>181.8 lbs   LVAD interrogation reveals:  Speed: 5600 Flow: 4.6 Power: 4.1 w PI: 4.8 Alarms: none Events: 80 today: 50+ yesterday Hct: 35  Fixed speed:  5600 Low speed limit:  5300  Drive Line:  Wound vac dressing clean, dry, intact.  Anchor in place x 2. Suction -100. Suctioning well, no alarms noted. Small amount of serosanguinous drainage noted in canister. Plan for washout and wound vac change in OR next Wednesday per Dr Prescott Gum.   Labs:  LDH trend: 203>187>142>145>139>161>155>148>152>149>166  INR trend:  1.1>1.1>1.4>1.9>2.2>2.4>1.8>1.9  Anticoagulation Plan: -INR Goal: 2.0 - 2.5 -ASA Dose: none -Heparin 500 u/hr -off  ICD: N/A  Infection:  01/27/23>> blood cultures>> no growth 4 days 01/27/23>>driveline culture>>FEW METHICILLIN RESISTANT STAPHYLOCOCCUS AUREUS  01/28/23>>driveline cx from OR>>FEW METHICILLIN RESISTANT STAPHYLOCOCCUS AUREUS; final  Plan/Recommendations:  Page VAD coordinator with equipment issues or driveline problems VAD coord will accompany to OR Wednesday for washout with Dr Lucianne Lei Deberah Castle RN,BSN [place] Coordinator  Office: [phone]  24/7 Pager: [phone]

## 2023-02-11 DIAGNOSIS — T829XXD Unspecified complication of cardiac and vascular prosthetic device, implant and graft, subsequent encounter: Secondary | ICD-10-CM | POA: Diagnosis not present

## 2023-02-11 LAB — LACTATE DEHYDROGENASE: LDH: 151 U/L (ref 98–192)

## 2023-02-11 LAB — CBC
HCT: 36.8 % — ABNORMAL LOW (ref 39.0–52.0)
Hemoglobin: 12.5 g/dL — ABNORMAL LOW (ref 13.0–17.0)
MCH: 28.1 pg (ref 26.0–34.0)
MCHC: 34 g/dL (ref 30.0–36.0)
MCV: 82.7 fL (ref 80.0–100.0)
Platelets: 330 10*3/uL (ref 150–400)
RBC: 4.45 MIL/uL (ref 4.22–5.81)
RDW: 13.9 % (ref 11.5–15.5)
WBC: 9.3 10*3/uL (ref 4.0–10.5)
nRBC: 0 % (ref 0.0–0.2)

## 2023-02-11 LAB — BASIC METABOLIC PANEL
Anion gap: 8 (ref 5–15)
BUN: 19 mg/dL (ref 8–23)
CO2: 24 mmol/L (ref 22–32)
Calcium: 9 mg/dL (ref 8.9–10.3)
Chloride: 102 mmol/L (ref 98–111)
Creatinine, Ser: 1.42 mg/dL — ABNORMAL HIGH (ref 0.61–1.24)
GFR, Estimated: 56 mL/min — ABNORMAL LOW (ref >60)
Glucose, Bld: 149 mg/dL — ABNORMAL HIGH (ref 70–99)
Potassium: 4.2 mmol/L (ref 3.5–5.1)
Sodium: 134 mmol/L — ABNORMAL LOW (ref 135–145)

## 2023-02-11 LAB — PROTIME-INR
INR: 2.2 — ABNORMAL HIGH (ref 0.8–1.2)
Prothrombin Time: 24.5 seconds — ABNORMAL HIGH (ref 11.4–15.2)

## 2023-02-11 LAB — CK: Total CK: 113 U/L (ref 49–397)

## 2023-02-11 LAB — GLUCOSE, CAPILLARY
Glucose-Capillary: 132 mg/dL — ABNORMAL HIGH (ref 70–99)
Glucose-Capillary: 81 mg/dL (ref 70–99)
Glucose-Capillary: 149 mg/dL — ABNORMAL HIGH (ref 70–99)
Glucose-Capillary: 143 mg/dL — ABNORMAL HIGH (ref 70–99)

## 2023-02-11 MED ORDER — ALBUMIN HUMAN 5 % IV SOLN
12.5000 g | Freq: Once | INTRAVENOUS | Status: AC
  Administered 2023-02-11: 12.5 g via INTRAVENOUS
  Filled 2023-02-11: qty 250

## 2023-02-11 MED ORDER — WARFARIN SODIUM 2 MG PO TABS
2.0000 mg | ORAL_TABLET | Freq: Once | ORAL | Status: AC
  Administered 2023-02-11: 2 mg via ORAL
  Filled 2023-02-11: qty 1

## 2023-02-11 MED ORDER — LACTATED RINGERS IV SOLN: INTRAVENOUS | Status: DC

## 2023-02-11 MED ORDER — PROCHLORPERAZINE EDISYLATE 10 MG/2ML IJ SOLN
10.0000 mg | Freq: Four times a day (QID) | INTRAMUSCULAR | Status: DC | PRN
  Administered 2023-02-11 – 2023-02-18 (×3): 10 mg via INTRAVENOUS
  Filled 2023-02-11 (×4): qty 2

## 2023-02-11 MED ORDER — SODIUM CHLORIDE 0.9 % IV BOLUS
250.0000 mL | Freq: Once | INTRAVENOUS | Status: AC
  Administered 2023-02-11: 250 mL via INTRAVENOUS

## 2023-02-11 NOTE — Plan of Care (Signed)
  Problem: Education: Goal: Patient will understand all VAD equipment and how it functions Outcome: Progressing Goal: Patient will be able to verbalize current INR target range and antiplatelet therapy for discharge home Outcome: Progressing   Problem: Cardiac: Goal: LVAD will function as expected and patient will experience no clinical alarms Outcome: Progressing   Problem: Education: Goal: Knowledge of General Education information will improve Description: Including pain rating scale, medication(s)/side effects and non-pharmacologic comfort measures Outcome: Progressing   Problem: Health Behavior/Discharge Planning: Goal: Ability to manage health-related needs will improve Outcome: Progressing   Problem: Clinical Measurements: Goal: Ability to maintain clinical measurements within normal limits will improve Outcome: Progressing Goal: Will remain free from infection Outcome: Progressing Goal: Diagnostic test results will improve Outcome: Progressing Goal: Respiratory complications will improve Outcome: Progressing Goal: Cardiovascular complication will be avoided Outcome: Progressing   Problem: Activity: Goal: Risk for activity intolerance will decrease Outcome: Progressing   Problem: Nutrition: Goal: Adequate nutrition will be maintained Outcome: Progressing   Problem: Coping: Goal: Level of anxiety will decrease Outcome: Progressing   Problem: Elimination: Goal: Will not experience complications related to bowel motility Outcome: Progressing Goal: Will not experience complications related to urinary retention Outcome: Progressing   Problem: Pain Managment: Goal: General experience of comfort will improve Outcome: Progressing   Problem: Safety: Goal: Ability to remain free from injury will improve Outcome: Progressing   Problem: Skin Integrity: Goal: Risk for impaired skin integrity will decrease Outcome: Progressing   

## 2023-02-11 NOTE — Progress Notes (Signed)
ANTICOAGULATION CONSULT NOTE  Pharmacy Consult for  warfarin  Indication: LVAD  Allergies  Allergen Reactions   Vancomycin Nausea And Vomiting and Other (See Comments)    Severe joint pain    Patient Measurements: Height: '5\' 10"'$  (177.8 cm) Weight: 82.7 kg (182 lb 5.1 oz) IBW/kg (Calculated) : 73   Vital Signs: Temp: 98.4 F (36.9 C) (03/05 1126) Temp Source: Oral (03/05 1126) BP: 120/79 (03/05 1126) Pulse Rate: 96 (03/05 0750)  Labs: Recent Labs    02/08/23 1811 02/09/23 0034 02/09/23 0034 02/10/23 0021 02/11/23 0605  HGB  --  10.5*   < > 11.8* 12.5*  HCT  --  30.2*  --  35.2* 36.8*  PLT  --  268  --  320 330  LABPROT  --  20.7*  --  21.9* 24.5*  INR  --  1.8*  --  1.9* 2.2*  HEPARINUNFRC <0.10* <0.10*  --  <0.10*  --   CREATININE  --  1.35*  --  1.15 1.42*  CKTOTAL  --   --   --   --  113   < > = values in this interval not displayed.     Estimated Creatinine Clearance: 55.7 mL/min (A) (by C-G formula based on SCr of 1.42 mg/dL (H)).   Medical History: Past Medical History:  Diagnosis Date   "    Arthritis    CAD (coronary artery disease)    a. s/p CABG in 11/2019 with LIMA-LAD, SVG-OM1, SVG-PDA and SVG-D1   CHF (congestive heart failure) (HCC)    a. EF < 20% by echo in 11/2019   Essential hypertension    PAF (paroxysmal atrial fibrillation) (HCC)    Type 2 diabetes mellitus (HCC)     Medications:   albumin human     DAPTOmycin (CUBICIN) 700 mg in sodium chloride 0.9 % IVPB Stopped (02/10/23 2300)   lactated ringers 75 mL/hr at 02/11/23 [id number]    Assessment: 62 yo male on chronic Coumadin for LVAD, poor compliance with outpatient checks.  INR on admission 1.1. S/p OR 2/20 for drive line infection debridement.   Previous Coumadin dose 6 mg daily except 4 mg on Tues, Thurs.  INR up 2.2  after restarting warfarin - will give lower dose tonight to prevent increased INR Hgb stable 10-11 plt stable 240-300  LDH stable at 149. No s/sx of bleeding.  Underwent I&D with wound VAC change 2/28 - next plan for 3/6.    Goal of Therapy:  INR 2-2.3 (keeping low in anticipation of return to OR soon) Monitor platelets by anticoagulation protocol: Yes   Plan:  Warfarin '2mg'$ x1 tonight  Daily protime and cbc    Bonnita Nasuti Pharm.D. CPP, BCPS Clinical Pharmacist [phone] 02/11/2023 11:44 AM     [facility] pharmacy phone numbers are listed on [email or web address]

## 2023-02-11 NOTE — Progress Notes (Addendum)
6 Days Post-Op Procedure(s) (LRB): ABDOMINAL DRIVELINE WOUND DEBRIDEMENT (N/A) WOUND VAC CHANGE (N/A) Subjective: Persistent nausea, getting dehydrated with intermittent VAD flows - giving 1 unit 5% albumin. Start maintenance IV fluid for  VAC change in OR  tomorrow. Compazine prn ordered as Zofran not effective Scan drainage from wound VAC  Objective: Vital signs in last 24 hours: Temp:  [97.8 F (36.6 C)-98.3 F (36.8 C)] 98.3 F (36.8 C) (03/05 0750) Pulse Rate:  [96-100] 96 (03/05 0750) Cardiac Rhythm: Normal sinus rhythm (03/05 0746) Resp:  [14-18] 14 (03/05 0750) BP: (82-136)/(66-100) 136/100 (03/05 0750) SpO2:  [94 %-96 %] 95 % (03/05 0750) Weight:  [82.7 kg] 82.7 kg (03/05 0612)  Hemodynamic parameters for last 24 hours:  nsr afebrile Intake/Output from previous day: 03/04 0701 - 03/05 0700 In: 552 [P.O.:360; IV Piggyback:192] Out: 1200 [Urine:1200] Intake/Output this shift: No intake/output data recorded.    Lab Results: Recent Labs    02/10/23 0021 02/11/23 0605  WBC 7.5 9.3  HGB 11.8* 12.5*  HCT 35.2* 36.8*  PLT 320 330   BMET:  Recent Labs    02/10/23 0021 02/11/23 0605  NA 135 134*  K 3.8 4.2  CL 103 102  CO2 22 24  GLUCOSE 120* 149*  BUN 14 19  CREATININE 1.15 1.42*  CALCIUM 9.1 9.0    PT/INR:  Recent Labs    02/11/23 0605  LABPROT 24.5*  INR 2.2*   ABG    Component Value Date/Time   PHART 7.508 (H) 11/20/2020 0438   HCO3 24.8 09/09/2021 0313   TCO2 29 11/20/2020 0438   ACIDBASEDEF 6.0 (H) 11/24/2019 1718   O2SAT 63.4 09/09/2021 0313   CBG (last 3)  Recent Labs    02/10/23 1655 02/10/23 2039 02/11/23 0611  GLUCAP 181* 186* 132*    Assessment/Plan: S/P Procedure(s) (LRB): ABDOMINAL DRIVELINE WOUND DEBRIDEMENT (N/A) WOUND VAC CHANGE (N/A) Treat nausea, IVF for low volume OR wound VAC change/washout tomorrow, may not need VAC much longer. INR 2.2- excellent.   LOS: 15 days    [name] 02/11/2023

## 2023-02-11 NOTE — Progress Notes (Signed)
LVAD Coordinator Rounding Note:  Admitted 01/27/23 to Heart Failure service from ED due to driveline infection. Started on IV antibiotics.   S/p driveline debridement in OR 2/21 & 2/28. Wound vac dressing clean, dry, intact. Scant serosanguinous drainage in canister. Plan to return to OR for washout 02/12/23 per Dr Prescott Gum.   Pt laying in bed. Tells me that he had a rough night with ongoing nausea and vomiting. Psych consulted over the weekend for suicidal ideation/depression. Patient now on Remeron.  Pysch documented that pt was not a threat to himself or others.    Pt having suction events on VAD interrogation this morning. 250 cc bolus received this am. Per Dr. Darcey Nora will add one time dose of 5% Albumin. Maintenance fluids scheduled to start at 2200 tonight for OR tomorrow.  Pt will need wound vac and home IV antibiotics at discharge. Pt will need placement in SNF for IV antibiotics at discharge as he is living with his mom and she is unable to help him with care/medication administration. VAD coordinator spoke with his wife last week and they are not currently getting along. It would be much safer for him to be in a SNF post discharge for IV antibiotics. Inpatient social work awaiting bed offers for placement. Pt has been denied admission to [facility], [facility], and [facility]. Search for placement ongoing.   Controller electively exchanged this admission. No further low voltage hazard alarms seen on interrogation.  Vital signs: Temp:  98.3 HR: 91 Doppler Pressure: 130 Automatic BP: 136/100 (112) O2 Sat: 98% on RA Wt: 192.9>197>195.9>190.9>187.6>188.9>184.1>189.6>182.3>181.8>182.3 lbs   LVAD interrogation reveals:  Speed: 5600 Flow: 4.7 Power: 4.1 w PI: 4.3 Alarms: none Events: 80 today: 50+ yesterday Hct: 35  Fixed speed:  5600 Low speed limit:  5300  Drive Line:  Wound vac dressing clean, dry, intact.  Anchor in place x 2. Suction -100. Suctioning well, no  alarms noted. Small amount of serosanguinous drainage noted in canister. Plan for washout and wound vac change in OR tomorrow per Dr Prescott Gum.   Labs:  LDH trend: 203>187>142>145>139>161>155>148>152>149>166>151  INR trend: 1.1>1.1>1.4>1.9>2.2>2.4>1.8>1.9>2.2  Anticoagulation Plan: -INR Goal: 2.0 - 2.5 -ASA Dose: none -Heparin 500 u/hr -off  ICD: N/A  Infection:  01/27/23>> blood cultures>> no growth 4 days 01/27/23>>driveline culture>>FEW METHICILLIN RESISTANT STAPHYLOCOCCUS AUREUS  01/28/23>>driveline cx from OR>>FEW METHICILLIN RESISTANT STAPHYLOCOCCUS AUREUS; final  Plan/Recommendations:  Page VAD coordinator with equipment issues or driveline problems VAD coord will accompany to OR Wednesday for washout with Dr Lucianne Lei Lytle Michaels RN,BSN [place] Coordinator  Office: [phone]  24/7 Pager: [phone]

## 2023-02-11 NOTE — Anesthesia Preprocedure Evaluation (Signed)
Anesthesia Evaluation  Patient identified by MRN, date of birth, ID band Patient awake    Reviewed: Allergy & Precautions, NPO status , Patient's Chart, lab work & pertinent test results  History of Anesthesia Complications Negative for: history of anesthetic complications  Airway Mallampati: II  TM Distance: >3 FB Neck ROM: Full    Dental no notable dental hx. (+) Edentulous Upper, Edentulous Lower   Pulmonary sleep apnea , COPD, former smoker   Pulmonary exam normal breath sounds clear to auscultation       Cardiovascular hypertension, + angina  + CAD, + CABG and +CHF  Normal cardiovascular exam Rhythm:Regular Rate:Normal  Echo 06/2022  1. LVAD present at LV apex. Speed increased from 5500 to 5700. Septum  mildly shifted to the right at 5500, shifted more leftward at 5700. Speed  left at 5600 rpm at end of study. Left ventricular ejection fraction, by  estimation, is 20 to 25%. The left  ventricle has severely decreased function. The left ventricle demonstrates  global hypokinesis. There is mild left ventricular hypertrophy. Left  ventricular diastolic parameters are indeterminate.   2. Right ventricular systolic function is mildly reduced. The right  ventricular size is moderately enlarged. Tricuspid regurgitation signal is  inadequate for assessing PA pressure.   3. Left atrial size was mildly dilated.   4. Right atrial size was mildly dilated.   5. The mitral valve is normal in structure. Mild mitral valve  regurgitation. No evidence of mitral stenosis.   6. The aortic valve opened every beat at 5500 rpm, every other beat at 5700 rpm. The aortic valve is tricuspid. Aortic valve regurgitation is not visualized. No aortic stenosis is present.   7. The inferior vena cava is normal in size with greater than 50% respiratory variability, suggesting right atrial pressure of 3 mmHg.   8. LVAD ramp study.       Neuro/Psych negative neurological ROS  negative psych ROS   GI/Hepatic negative GI ROS, Neg liver ROS,,,  Endo/Other  diabetes Hyperthyroidism   Renal/GU Renal disease     Musculoskeletal  (+) Arthritis ,    Abdominal   Peds  Hematology   Anesthesia Other Findings   Reproductive/Obstetrics                              Anesthesia Physical Anesthesia Plan  ASA: 4  Anesthesia Plan: MAC   Post-op Pain Management: Ofirmev IV (intra-op)*   Induction: Intravenous  PONV Risk Score and Plan: 2 and Ondansetron, Dexamethasone, Midazolam and Treatment may vary due to age or medical condition  Airway Management Planned: Natural Airway  Additional Equipment:   Intra-op Plan:   Post-operative Plan:   Informed Consent: I have reviewed the patients History and Physical, chart, labs and discussed the procedure including the risks, benefits and alternatives for the proposed anesthesia with the patient or authorized representative who has indicated his/her understanding and acceptance.     Dental advisory given  Plan Discussed with: CRNA  Anesthesia Plan Comments: (Had intermittent nausea and vomiting. He denies nausea today. Appropriately NPO. )         Anesthesia Quick Evaluation

## 2023-02-11 NOTE — Progress Notes (Signed)
Patient ID: Tawni Millers., male   DOB: [birth year], 62 y.o.   MRN: [id number]   Advanced Heart Failure VAD Team Note  PCP-Cardiologist: Rozann Lesches, MD  AHF: Dr. Aundra Dubin   Subjective:   Driveline site infection.  Wound Cx with MRSA. On IV vancomycin.  OR for debridement w/ wound vac placement 2/20 and 2/28.  Psych consulted and saw patient 3/3 for suicidal ideation/depression. Patient now on Remeron.   Plan for OR return tomorrow.   Had a bad night last night with lots of nausea. Unable to keep much down. SCr 1.15>1.42, suspect he's dry. Denies CP, dizziness or SOB.   LVAD INTERROGATION:  HeartMate III LVAD:   Flow 4.4 liters/min, speed 5600, power 4.2, PI 4.8. 24 PI events.   Objective:    Vital Signs:   Temp:  [97.8 F (36.6 C)-98.2 F (36.8 C)] 98.1 F (36.7 C) (03/05 0612) Pulse Rate:  [95-100] 98 (03/05 0612) Resp:  [14-18] 15 (03/05 0612) BP: (82-124)/(40-100) 88/76 (03/05 0612) SpO2:  [94 %-96 %] 94 % (03/05 0612) Weight:  [82.7 kg] 82.7 kg (03/05 0612) Last BM Date : 02/07/23 Mean arterial Pressure 80s-90s  Intake/Output:   Intake/Output Summary (Last 24 hours) at 02/11/2023 0709 Last data filed at 02/10/2023 2307 Gross per 24 hour  Intake 552 ml  Output 1200 ml  Net -648 ml    Physical Exam  General:  Well appearing. No resp difficulty HEENT: Normal Neck: supple. No JVP . Carotids 2+ bilat; no bruits. No lymphadenopathy or thyromegaly appreciated. Cor: Mechanical heart sounds with LVAD hum present. Lungs: Clear Abdomen: soft, nontender, nondistended. No hepatosplenomegaly. No bruits or masses. Good bowel sounds. Driveline: C/D/I; securement device intact. Wound vac in place with good suction Extremities: no cyanosis, clubbing, rash, edema Neuro: alert & orientedx3, cranial nerves grossly intact. moves all 4 extremities w/o difficulty. Affect pleasant   Telemetry   NSR 90s + PVCs (Personally reviewed)    Labs   Basic Metabolic Panel: Recent  Labs  Lab 02/07/23 0608 02/08/23 0616 02/09/23 0034 02/10/23 0021 02/11/23 0605  NA 133* 137 137 135 134*  K 3.8 3.7 3.9 3.8 4.2  CL 101 103 106 103 102  CO2 '22 23 24 22 24  '$ GLUCOSE 145* 161* 103* 120* 149*  BUN '17 18 18 14 19  '$ CREATININE 1.19 1.18 1.35* 1.15 1.42*  CALCIUM 8.6* 8.7* 8.6* 9.1 9.0  MG  --   --   --  1.9  --    Liver Function Tests: No results for input(s): "AST", "ALT", "ALKPHOS", "BILITOT", "PROT", "ALBUMIN" in the last 168 hours.  CBC: Recent Labs  Lab 02/06/23 0017 02/08/23 0616 02/09/23 0034 02/10/23 0021 02/11/23 0605  WBC 6.2 8.5 7.4 7.5 9.3  HGB 10.5* 11.7* 10.5* 11.8* 12.5*  HCT 29.7* 33.7* 30.2* 35.2* 36.8*  MCV 80.9 80.8 82.5 83.2 82.7  PLT 203 284 268 320 330   INR: Recent Labs  Lab 02/07/23 0608 02/08/23 0616 02/09/23 0034 02/10/23 0021 02/11/23 0605  INR 1.8* 1.5* 1.8* 1.9* 2.2*    Other results:  Imaging   No results found.  Medications:     Scheduled Medications:  allopurinol  100 mg Oral Daily   amiodarone  200 mg Oral BID   feeding supplement (GLUCERNA SHAKE)  237 mL Oral TID WC   glipiZIDE  5 mg Oral Q breakfast   guaiFENesin  600 mg Oral BID   insulin aspart  0-24 Units Subcutaneous TID WC   insulin aspart  0-5 Units Subcutaneous QHS   loratadine  10 mg Oral Daily   metFORMIN  500 mg Oral BID WC   metoprolol succinate  25 mg Oral Daily   minocycline  200 mg Oral BID   mirtazapine  15 mg Oral QHS   multivitamin with minerals  1 tablet Oral Q24H   neomycin-bacitracin-polymyxin   Topical Daily   pantoprazole  40 mg Oral Daily   sertraline  50 mg Oral Daily   tamsulosin  0.4 mg Oral Daily   thiamine  100 mg Oral Daily   traZODone  150 mg Oral QHS   Warfarin - Pharmacist Dosing Inpatient   Does not apply q1600   zinc sulfate  220 mg Oral Daily    Infusions:  DAPTOmycin (CUBICIN) 700 mg in sodium chloride 0.9 % IVPB Stopped (02/10/23 2300)    PRN Medications: acetaminophen, chlordiazePOXIDE, colchicine,  fluticasone, hydrALAZINE, ipratropium, levalbuterol, ondansetron (ZOFRAN) IV, mouth rinse  Patient Profile  62 y.o. 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 type 2 diabetes mellitus, COPD, hypertension, and HMIII VAD.    Admitted with recurrent LVAD complication driveline infection + suspected AECOPD Assessment/Plan:   1. LVAD Complication---> Recurrent driveline infection: MRSA driveline infection, admitted in 6/23 and again in 7/23-8/23 with multiple trips to the OR for debridement. He had completed vancomycin and then long-term doxycycline. Most recent culture PTA grew acinetobacter and MRSA. ID started minocycline. He had been off minocycline for at least 3 days prior to this admit w/ copious drainage around driveline. CT chest/abd/pelvis showed stranding around the driveline and small amount of fluid. Started on Vanc and Meropenum per ID. Now off meropenem (2/21).  OR for debridement w/ wound vac placement 2/20. Wound culture with MRSA, blood cultures negative.  Afebrile.  - Will need 6 wks abx. Vanc switched to dapto 2/27 with nausea. Now on dapto/minocycline. Once dapto complete continue suppressive minocycline   - Currently AF. Site looks ok  - Plan to return to OR for washout/vac change 3/6. Keep INR 1.8-2.2 until then.  2. Chronic HFrEF: Echo 10/21 with EF 20-25%, mildly decreased RV function. LHC/RHC in 12/21 with patent grafts, low output. Suspect mixed ischemic/nonischemic cardiomyopathy (prior heavy ETOH and drugs as well as CAD).  No ETOH, drugs, smoking since CABG in 12/20. Admitted with cardiogenic shock in 12/21, had placement of Impella 5.5 initially, now s/p Heartmate 3 LVAD on 11/17/20.  Ramp echo 2/23 with speed decreased to 5500 rpm.  - Volume status ok. Po intake has been poor, received 1L IVF 3/3. Will do another  250m bolus with AKI as he's unable to keep much down.  - Warfarin restarted, goal INR 1.8-2.2 until surgery. INR 2.2. Now off low-dose heparin Discussed dosing with PharmD. - Not a candidate for transplant with ongoing drug use.  3. HTN: MAPs elevated low 100s on admit, poor compliance with meds. Has fluctuated significantly while admitted. MAPs elevated this am in setting of emotional distress - Can continue Toprol XL 25 mg daily  - Entresto stopped due to lower Maps.   - BP more stable on hydralazine PRN - Has spikes of HTN with n/v. ? Drug withdrawal 4. Polysubstance Abuse: Recently started using methamphetamine again.  UDS + for amphetamines.  - SW team following. Unfortunately, limited resources for inpatient substance use due LVAD - Having paroxysms of n/v and HTN.  suspect withdrawal from meth. - Will continue CIWA protocol for now  - Psych saw patient 3/3, now on remeron 5. PAF: S/p Maze and LA appendage clip with CABG in 12/20.  NSR this admission.  6. 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  Still some wheezing but has improved.   - Continue scheduled atrovent.  - Levalbuterol prn.  - Have avoided prednisone with surgery at driveline site for recurrent infection.   7. DMII - SSI  8.  NSVT/PVCs: Frequent NSVT 2/23, may have been dry after diuresis.  Amiodarone gtt started to suppress.  - Continue po amiodarone 200 mg bid.  - Would eventually stop this medication, avoid with COPD.   9. Nausea/vomiting - Began 2/27. Assumed 2/2 vancomycin initially however vanc switched to daptomycin and nausea continues.  - KUB ok - suspect etiology due to severe emotional distress/anxiety +/- substance abuse WD  - Continue CIWA until Psych can see 10. Major depression - Patient refusing to work with PT, not motivated and expresses frustration with his overall situation.  Ongoing drug use, no longer living with his wife.  Has been living with his mother who cannot care for him.  - Now saying that he would  like his VAD to be off so that his wife and mother don't have him in common any more  - Psych consulted and saw patient, now on remeron 11. AKI - SCr 1.15>1.42. Suspect 2/2 #9.  - unable to keep much down, will give 267m NS  I reviewed the LVAD parameters from today, and compared the results to the patient's prior recorded data.  No programming changes were made.  The LVAD is functioning within specified parameters.  The patient performs LVAD self-test daily.  LVAD interrogation was negative for any significant power changes, alarms or PI events/speed drops.  LVAD equipment check completed and is in good working order.  Back-up equipment present.   LVAD education done on emergency procedures and precautions and reviewed exit site care.  Length of Stay: 1Floris NP 02/11/2023, 7:09 AM  VAD Team --- VAD ISSUES ONLY--- Pager [phone](7am - 7am)  Advanced Heart Failure Team  Pager [phone](M-F; 7a - 5p)  Please contact [email or web address] for night-coverage after hours (5p -7a ) and weekends on [email or web address]

## 2023-02-12 ENCOUNTER — Encounter (HOSPITAL_COMMUNITY)
Admission: EM | Disposition: A | Discharge status: Skilled Nursing Facility | Payer: Self-pay | Source: Ambulatory Visit | Attending: Cardiology

## 2023-02-12 ENCOUNTER — Inpatient Hospital Stay (HOSPITAL_COMMUNITY): Payer: 59 | Admitting: Anesthesiology

## 2023-02-12 ENCOUNTER — Other Ambulatory Visit: Payer: Self-pay

## 2023-02-12 DIAGNOSIS — E119 Type 2 diabetes mellitus without complications: Secondary | ICD-10-CM

## 2023-02-12 DIAGNOSIS — T827XXA Infection and inflammatory reaction due to other cardiac and vascular devices, implants and grafts, initial encounter: Secondary | ICD-10-CM | POA: Diagnosis not present

## 2023-02-12 DIAGNOSIS — I110 Hypertensive heart disease with heart failure: Secondary | ICD-10-CM | POA: Diagnosis not present

## 2023-02-12 DIAGNOSIS — I25119 Atherosclerotic heart disease of native coronary artery with unspecified angina pectoris: Secondary | ICD-10-CM | POA: Diagnosis not present

## 2023-02-12 DIAGNOSIS — T829XXA Unspecified complication of cardiac and vascular prosthetic device, implant and graft, initial encounter: Secondary | ICD-10-CM

## 2023-02-12 DIAGNOSIS — T829XXD Unspecified complication of cardiac and vascular prosthetic device, implant and graft, subsequent encounter: Secondary | ICD-10-CM | POA: Diagnosis not present

## 2023-02-12 DIAGNOSIS — I509 Heart failure, unspecified: Secondary | ICD-10-CM

## 2023-02-12 DIAGNOSIS — Z87891 Personal history of nicotine dependence: Secondary | ICD-10-CM

## 2023-02-12 HISTORY — PX: STERNAL WOUND DEBRIDEMENT: SHX1058

## 2023-02-12 HISTORY — PX: APPLICATION OF WOUND VAC: SHX5189

## 2023-02-12 LAB — CBC
HCT: 35.4 % — ABNORMAL LOW (ref 39.0–52.0)
Hemoglobin: 11.9 g/dL — ABNORMAL LOW (ref 13.0–17.0)
MCH: 28 pg (ref 26.0–34.0)
MCHC: 33.6 g/dL (ref 30.0–36.0)
MCV: 83.3 fL (ref 80.0–100.0)
Platelets: 324 10*3/uL (ref 150–400)
RBC: 4.25 MIL/uL (ref 4.22–5.81)
RDW: 14.1 % (ref 11.5–15.5)
WBC: 10.8 10*3/uL — ABNORMAL HIGH (ref 4.0–10.5)
nRBC: 0 % (ref 0.0–0.2)

## 2023-02-12 LAB — GLUCOSE, CAPILLARY
Glucose-Capillary: 141 mg/dL — ABNORMAL HIGH (ref 70–99)
Glucose-Capillary: 166 mg/dL — ABNORMAL HIGH (ref 70–99)
Glucose-Capillary: 156 mg/dL — ABNORMAL HIGH (ref 70–99)
Glucose-Capillary: 88 mg/dL (ref 70–99)
Glucose-Capillary: 104 mg/dL — ABNORMAL HIGH (ref 70–99)
Glucose-Capillary: 159 mg/dL — ABNORMAL HIGH (ref 70–99)

## 2023-02-12 LAB — BASIC METABOLIC PANEL
Anion gap: 10 (ref 5–15)
BUN: 18 mg/dL (ref 8–23)
CO2: 25 mmol/L (ref 22–32)
Calcium: 9 mg/dL (ref 8.9–10.3)
Chloride: 100 mmol/L (ref 98–111)
Creatinine, Ser: 1.17 mg/dL (ref 0.61–1.24)
GFR, Estimated: >60 mL/min (ref >60)
Glucose, Bld: 152 mg/dL — ABNORMAL HIGH (ref 70–99)
Potassium: 4.1 mmol/L (ref 3.5–5.1)
Sodium: 135 mmol/L (ref 135–145)

## 2023-02-12 LAB — PROTIME-INR
INR: 2.5 — ABNORMAL HIGH (ref 0.8–1.2)
Prothrombin Time: 26.5 seconds — ABNORMAL HIGH (ref 11.4–15.2)

## 2023-02-12 LAB — MAGNESIUM: Magnesium: 1.8 mg/dL (ref 1.7–2.4)

## 2023-02-12 LAB — LACTATE DEHYDROGENASE: LDH: 156 U/L (ref 98–192)

## 2023-02-12 SURGERY — DEBRIDEMENT, WOUND, STERNUM
Anesthesia: Monitor Anesthesia Care | Site: Abdomen

## 2023-02-12 MED ORDER — MIDAZOLAM HCL 2 MG/2ML IJ SOLN
INTRAMUSCULAR | Status: AC
  Filled 2023-02-12: qty 2

## 2023-02-12 MED ORDER — LIDOCAINE 2% (20 MG/ML) 5 ML SYRINGE
INTRAMUSCULAR | Status: DC | PRN
  Administered 2023-02-12: 60 mg via INTRAVENOUS

## 2023-02-12 MED ORDER — SODIUM CHLORIDE 0.9 % IR SOLN
Status: DC | PRN
  Administered 2023-02-12: 2000 mL

## 2023-02-12 MED ORDER — WARFARIN SODIUM 2.5 MG PO TABS
2.5000 mg | ORAL_TABLET | Freq: Once | ORAL | Status: AC
  Administered 2023-02-12: 2.5 mg via ORAL
  Filled 2023-02-12: qty 1

## 2023-02-12 MED ORDER — FENTANYL CITRATE (PF) 100 MCG/2ML IJ SOLN: 25.0000 ug | INTRAMUSCULAR | Status: DC | PRN

## 2023-02-12 MED ORDER — ALBUMIN HUMAN 5 % IV SOLN
INTRAVENOUS | Status: AC
  Filled 2023-02-12: qty 250

## 2023-02-12 MED ORDER — ONDANSETRON HCL 4 MG/2ML IJ SOLN
INTRAMUSCULAR | Status: AC
  Filled 2023-02-12: qty 2

## 2023-02-12 MED ORDER — ORAL CARE MOUTH RINSE: 15.0000 mL | Freq: Once | OROMUCOSAL | Status: AC

## 2023-02-12 MED ORDER — MIDAZOLAM HCL 2 MG/2ML IJ SOLN
INTRAMUSCULAR | Status: DC | PRN
  Administered 2023-02-12: 1 mg via INTRAVENOUS

## 2023-02-12 MED ORDER — AMISULPRIDE (ANTIEMETIC) 5 MG/2ML IV SOLN: 10.0000 mg | Freq: Once | INTRAVENOUS | Status: DC | PRN

## 2023-02-12 MED ORDER — ONDANSETRON HCL 4 MG/2ML IJ SOLN
INTRAMUSCULAR | Status: DC | PRN
  Administered 2023-02-12: 4 mg via INTRAVENOUS

## 2023-02-12 MED ORDER — PROPOFOL 500 MG/50ML IV EMUL
INTRAVENOUS | Status: DC | PRN
  Administered 2023-02-12: 50 ug/kg/min via INTRAVENOUS

## 2023-02-12 MED ORDER — PROMETHAZINE HCL 25 MG/ML IJ SOLN: 6.2500 mg | INTRAMUSCULAR | Status: DC | PRN

## 2023-02-12 MED ORDER — ALBUMIN HUMAN 5 % IV SOLN
12.5000 g | Freq: Once | INTRAVENOUS | Status: AC
  Administered 2023-02-12: 12.5 g via INTRAVENOUS

## 2023-02-12 MED ORDER — LACTATED RINGERS IV SOLN: INTRAVENOUS | Status: DC

## 2023-02-12 MED ORDER — PHENYLEPHRINE HCL-NACL 20-0.9 MG/250ML-% IV SOLN
INTRAVENOUS | Status: DC | PRN
  Administered 2023-02-12: 50 ug/min via INTRAVENOUS

## 2023-02-12 MED ORDER — ALBUMIN HUMAN 5 % IV SOLN
12.5000 g | Freq: Once | INTRAVENOUS | Status: AC
  Administered 2023-02-12: 12.5 g via INTRAVENOUS
  Filled 2023-02-12: qty 250

## 2023-02-12 MED ORDER — FENTANYL CITRATE (PF) 250 MCG/5ML IJ SOLN
INTRAMUSCULAR | Status: DC | PRN
  Administered 2023-02-12: 25 ug via INTRAVENOUS

## 2023-02-12 MED ORDER — PROPOFOL 10 MG/ML IV BOLUS
INTRAVENOUS | Status: AC
  Filled 2023-02-12: qty 20

## 2023-02-12 MED ORDER — FENTANYL CITRATE (PF) 250 MCG/5ML IJ SOLN
INTRAMUSCULAR | Status: AC
  Filled 2023-02-12: qty 5

## 2023-02-12 MED ORDER — CHLORHEXIDINE GLUCONATE 0.12 % MT SOLN
15.0000 mL | Freq: Once | OROMUCOSAL | Status: AC
  Administered 2023-02-12: 15 mL via OROMUCOSAL

## 2023-02-12 SURGICAL SUPPLY — 82 items
APL SKNCLS STERI-STRIP NONHPOA (GAUZE/BANDAGES/DRESSINGS) ×1
ATTRACTOMAT 16X20 MAGNETIC DRP (DRAPES) ×1 IMPLANT
BAG DECANTER FOR FLEXI CONT (MISCELLANEOUS) ×1 IMPLANT
BENZOIN TINCTURE PRP APPL 2/3 (GAUZE/BANDAGES/DRESSINGS) IMPLANT
BLADE CLIPPER SURG (BLADE) ×1 IMPLANT
BLADE SURG 10 STRL SS (BLADE) ×1 IMPLANT
BLADE SURG 15 STRL LF DISP TIS (BLADE) IMPLANT
BLADE SURG 15 STRL SS (BLADE)
BNDG GAUZE DERMACEA FLUFF 4 (GAUZE/BANDAGES/DRESSINGS) IMPLANT
BNDG GZE DERMACEA 4 6PLY (GAUZE/BANDAGES/DRESSINGS)
CANISTER SUCT 3000ML PPV (MISCELLANEOUS) ×1 IMPLANT
CANISTER WOUND CARE 500ML ATS (WOUND CARE) ×1 IMPLANT
CATH FOLEY 2WAY SLVR  5CC 16FR (CATHETERS)
CATH FOLEY 2WAY SLVR 5CC 16FR (CATHETERS) IMPLANT
CATH THORACIC 28FR RT ANG (CATHETERS) IMPLANT
CATH THORACIC 36FR (CATHETERS) IMPLANT
CLIP TI WIDE RED SMALL 24 (CLIP) IMPLANT
CNTNR URN SCR LID CUP LEK RST (MISCELLANEOUS) IMPLANT
CONN Y 3/8X3/8X3/8  BEN (MISCELLANEOUS)
CONN Y 3/8X3/8X3/8 BEN (MISCELLANEOUS) IMPLANT
CONT SPEC 4OZ STRL OR WHT (MISCELLANEOUS) ×1
CONTAINER PROTECT SURGISLUSH (MISCELLANEOUS) ×2 IMPLANT
COVER SURGICAL LIGHT HANDLE (MISCELLANEOUS) ×2 IMPLANT
DRAPE LAPAROSCOPIC ABDOMINAL (DRAPES) ×1 IMPLANT
DRAPE SLUSH/WARMER DISC (DRAPES) IMPLANT
DRAPE WARM FLUID 44X44 (DRAPES) IMPLANT
DRSG AQUACEL AG ADV 3.5X14 (GAUZE/BANDAGES/DRESSINGS) ×1 IMPLANT
DRSG VAC ATS LRG SENSATRAC (GAUZE/BANDAGES/DRESSINGS) ×1 IMPLANT
DRSG VAC ATS MED SENSATRAC (GAUZE/BANDAGES/DRESSINGS) ×1 IMPLANT
DRSG VAC ATS SM SENSATRAC (GAUZE/BANDAGES/DRESSINGS) ×1 IMPLANT
ELECT CAUTERY BLADE 6.4 (BLADE) IMPLANT
ELECT REM PT RETURN 9FT ADLT (ELECTROSURGICAL) ×1
ELECTRODE REM PT RTRN 9FT ADLT (ELECTROSURGICAL) ×1 IMPLANT
GAUZE 4X4 16PLY ~~LOC~~+RFID DBL (SPONGE) ×1 IMPLANT
GAUZE PAD ABD 8X10 STRL (GAUZE/BANDAGES/DRESSINGS) IMPLANT
GAUZE SPONGE 2X2 STRL 8-PLY (GAUZE/BANDAGES/DRESSINGS) IMPLANT
GAUZE SPONGE 4X4 12PLY STRL (GAUZE/BANDAGES/DRESSINGS) ×1 IMPLANT
GAUZE XEROFORM 5X9 LF (GAUZE/BANDAGES/DRESSINGS) IMPLANT
GLOVE BIO SURGEON STRL SZ7.5 (GLOVE) ×2 IMPLANT
GLOVE BIOGEL PI IND STRL 6 (GLOVE) IMPLANT
GLOVE BIOGEL PI IND STRL 7.0 (GLOVE) IMPLANT
GOWN STRL REUS W/ TWL LRG LVL3 (GOWN DISPOSABLE) ×4 IMPLANT
GOWN STRL REUS W/TWL LRG LVL3 (GOWN DISPOSABLE) ×2
GRAFT SKIN WND SUPRA SDRM 9X9 (Tissue) IMPLANT
HANDPIECE INTERPULSE COAX TIP (DISPOSABLE)
HEMOSTAT POWDER SURGIFOAM 1G (HEMOSTASIS) IMPLANT
HEMOSTAT SURGICEL 2X14 (HEMOSTASIS) IMPLANT
KIT BASIN OR (CUSTOM PROCEDURE TRAY) ×1 IMPLANT
KIT SUCTION CATH 14FR (SUCTIONS) IMPLANT
KIT TURNOVER KIT B (KITS) ×1 IMPLANT
NS IRRIG 1000ML POUR BTL (IV SOLUTION) ×1 IMPLANT
PACK CHEST (CUSTOM PROCEDURE TRAY) ×1 IMPLANT
PACK GENERAL/GYN (CUSTOM PROCEDURE TRAY) ×1 IMPLANT
PACK UNIVERSAL I (CUSTOM PROCEDURE TRAY) IMPLANT
PAD ARMBOARD 7.5X6 YLW CONV (MISCELLANEOUS) ×2 IMPLANT
PENCIL BUTTON HOLSTER BLD 10FT (ELECTRODE) IMPLANT
SET HNDPC FAN SPRY TIP SCT (DISPOSABLE) ×1 IMPLANT
SOL PREP POV-IOD 4OZ 10% (MISCELLANEOUS) IMPLANT
SOL SCRUB PVP POV-IOD 4OZ 7.5% (MISCELLANEOUS) ×1
SOLUTION SCRB POV-IOD 4OZ 7.5% (MISCELLANEOUS) IMPLANT
SPONGE T-LAP 18X18 ~~LOC~~+RFID (SPONGE) ×5 IMPLANT
SPONGE T-LAP 4X18 ~~LOC~~+RFID (SPONGE) ×1 IMPLANT
STAPLER VISISTAT 35W (STAPLE) IMPLANT
SUT ETHILON 3 0 FSL (SUTURE) IMPLANT
SUT STEEL 6MS V (SUTURE) IMPLANT
SUT STEEL STERNAL CCS#1 18IN (SUTURE) IMPLANT
SUT STEEL SZ 6 DBL 3X14 BALL (SUTURE) IMPLANT
SUT VIC AB 1 CTX 36 (SUTURE)
SUT VIC AB 1 CTX36XBRD ANBCTR (SUTURE) ×2 IMPLANT
SUT VIC AB 2-0 CTX 27 (SUTURE) ×2 IMPLANT
SUT VIC AB 3-0 X1 27 (SUTURE) ×2 IMPLANT
SWAB COLLECTION DEVICE MRSA (MISCELLANEOUS) IMPLANT
SWAB CULTURE ESWAB REG 1ML (MISCELLANEOUS) IMPLANT
SYR 5ML LL (SYRINGE) IMPLANT
SYR BULB IRRIG 60ML STRL (SYRINGE) IMPLANT
TAPE CLOTH SURG 4X10 WHT LF (GAUZE/BANDAGES/DRESSINGS) IMPLANT
TOWEL GREEN STERILE (TOWEL DISPOSABLE) ×1 IMPLANT
TOWEL GREEN STERILE FF (TOWEL DISPOSABLE) ×1 IMPLANT
TRAY FOLEY MTR SLVR 16FR STAT (SET/KITS/TRAYS/PACK) IMPLANT
TUBE CONNECTING 20X1/4 (TUBING) IMPLANT
WATER STERILE IRR 1000ML POUR (IV SOLUTION) ×1 IMPLANT
YANKAUER SUCT BULB TIP NO VENT (SUCTIONS) IMPLANT

## 2023-02-12 NOTE — Progress Notes (Addendum)
VAD Coordinator Procedure Note:   VAD Coordinator met patient in short stay. Pt undergoing wound debridement with wound vac change per Dr. Darcey Nora. Hemodynamics and VAD parameters monitored by myself and anesthesia throughout the procedure. Blood pressures were obtained with automatic cuff on right arm.   Time: Doppler Auto  BP Flow PI Power Speed  Pre-procedure:  1337  88/73(80) 4.5 2.5 4.1 5600   1400  81/56(65) 4.5 2.7 4.0 5600   1430  96/78(85) 3.9 4.5 4.1 5600  Sedation Induction: 1435  116/99(106) 3.7 4.8 4.1 5600   1445  87/73(79) 4.3 3.7 4.1 5600   1500  87/63(71) 4.0 3.9 2.3 5600           Recovery Area: 1510  90/79(84) 4.5 3.1 4.1 5600   1520  76/66(72) 4.4 2.4 4.0 5600   Patient tolerated the procedure well. VAD Coordinator accompanied and remained with patient in recovery area with no issue. Patient having suction events upon arrival back to [facility]. 5% Albumin ordered per Dr. Darcey Nora.   Drive line:   Wound Vac removed. Wound measured 5x2.5x1.5. Will continue daily dressing changes with Vashe soaked gauze wet to dry.  Patient Disposition: 2C02 Report given to [employer]

## 2023-02-12 NOTE — Progress Notes (Signed)
Patient ID: Kevin Underwood., male   DOB: 1961-04-02, 62 y.o.   MRN: [id number]   Advanced Heart Failure VAD Team Note  PCP-Cardiologist: Rozann Lesches, MD  AHF: Dr. Aundra Dubin   Subjective:   Driveline site infection.  Wound Cx with MRSA. On IV vancomycin.  OR for debridement w/ wound vac placement 2/20 and 2/28.  Psych consulted and saw patient 3/3 for suicidal ideation/depression. Patient now on Remeron.   AKI improved w/ IVFs, Scr 1.42>>1.17. No further N/V but still not eating much. Feels ok today w/o current complaints. Denies dyspnea. No chest/abdominal pain.  Scheduled for washout today, INR 2.5   Had 16 beat run of VT this AM. K 4.1. Mg pending.   LVAD INTERROGATION:  HeartMate III LVAD:   Flow 4.4 liters/min, speed 5600, power 4.2, PI 4.5. Multiple PI events.   Objective:    Vital Signs:   Temp:  [97.7 F (36.5 C)-98.4 F (36.9 C)] 98.1 F (36.7 C) (03/06 0415) Pulse Rate:  [78-99] 99 (03/06 0415) Resp:  [14-19] 18 (03/06 0415) BP: (88-146)/(72-102) 146/102 (03/06 0415) SpO2:  [95 %-97 %] 97 % (03/06 0415) Weight:  [83.2 kg] 83.2 kg (03/06 0620) Last BM Date : 02/07/23 Mean arterial Pressure low 100s   Intake/Output:   Intake/Output Summary (Last 24 hours) at 02/12/2023 [id number] Last data filed at 02/12/2023 0620 Gross per 24 hour  Intake 1100.72 ml  Output 3250 ml  Net -2149.28 ml    Physical Exam   General:  fatigued appearing, laying in bed. No resp difficulty HEENT: Normal Neck: supple. JVP 7 cm . Carotids 2+ bilat; no bruits. No lymphadenopathy or thyromegaly appreciated. Cor: Mechanical heart sounds with LVAD hum present. Lungs: CTAB Abdomen: soft, nontender, nondistended. No hepatosplenomegaly. No bruits or masses. Good bowel sounds. Driveline: C/D/I; securement device intact. Wound vac in place with good suction Extremities: no cyanosis, clubbing, rash, no edema Neuro: alert & orientedx3, cranial nerves grossly intact. moves all 4 extremities w/o  difficulty. Affect pleasant   Telemetry   NSR 90s, 1 16 beat run of NSVT (Personally reviewed)    Labs   Basic Metabolic Panel: Recent Labs  Lab 02/08/23 0616 02/09/23 0034 02/10/23 0021 02/11/23 0605 02/12/23 0615  NA 137 137 135 134* 135  K 3.7 3.9 3.8 4.2 4.1  CL 103 106 103 102 100  CO2 '23 24 22 24 25  '$ GLUCOSE 161* 103* 120* 149* 152*  BUN '18 18 14 19 18  '$ CREATININE 1.18 1.35* 1.15 1.42* 1.17  CALCIUM 8.7* 8.6* 9.1 9.0 9.0  MG  --   --  1.9  --   --    Liver Function Tests: No results for input(s): "AST", "ALT", "ALKPHOS", "BILITOT", "PROT", "ALBUMIN" in the last 168 hours.  CBC: Recent Labs  Lab 02/08/23 0616 02/09/23 0034 02/10/23 0021 02/11/23 0605 02/12/23 0615  WBC 8.5 7.4 7.5 9.3 10.8*  HGB 11.7* 10.5* 11.8* 12.5* 11.9*  HCT 33.7* 30.2* 35.2* 36.8* 35.4*  MCV 80.8 82.5 83.2 82.7 83.3  PLT 284 268 320 330 324   INR: Recent Labs  Lab 02/08/23 0616 02/09/23 0034 02/10/23 0021 02/11/23 0605 02/12/23 0615  INR 1.5* 1.8* 1.9* 2.2* 2.5*    Other results:  Imaging   No results found.  Medications:     Scheduled Medications:  allopurinol  100 mg Oral Daily   amiodarone  200 mg Oral BID   feeding supplement (GLUCERNA SHAKE)  237 mL Oral TID WC   glipiZIDE  5  mg Oral Q breakfast   guaiFENesin  600 mg Oral BID   insulin aspart  0-24 Units Subcutaneous TID WC   insulin aspart  0-5 Units Subcutaneous QHS   loratadine  10 mg Oral Daily   metFORMIN  500 mg Oral BID WC   metoprolol succinate  25 mg Oral Daily   minocycline  200 mg Oral BID   mirtazapine  15 mg Oral QHS   multivitamin with minerals  1 tablet Oral Q24H   neomycin-bacitracin-polymyxin   Topical Daily   pantoprazole  40 mg Oral Daily   sertraline  50 mg Oral Daily   tamsulosin  0.4 mg Oral Daily   thiamine  100 mg Oral Daily   traZODone  150 mg Oral QHS   Warfarin - Pharmacist Dosing Inpatient   Does not apply q1600   zinc sulfate  220 mg Oral Daily    Infusions:   DAPTOmycin (CUBICIN) 700 mg in sodium chloride 0.9 % IVPB 700 mg (02/11/23 2032)   lactated ringers 75 mL/hr at 02/11/23 1630    PRN Medications: acetaminophen, chlordiazePOXIDE, colchicine, fluticasone, hydrALAZINE, ipratropium, levalbuterol, ondansetron (ZOFRAN) IV, mouth rinse, prochlorperazine  Patient Profile  62 y.o. 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 type 2 diabetes mellitus, COPD, hypertension, and HMIII VAD.    Admitted with recurrent LVAD complication driveline infection + suspected AECOPD Assessment/Plan:   1. LVAD Complication---> Recurrent driveline infection: MRSA driveline infection, admitted in 6/23 and again in 7/23-8/23 with multiple trips to the OR for debridement. He had completed vancomycin and then long-term doxycycline. Most recent culture PTA grew acinetobacter and MRSA. ID started minocycline. He had been off minocycline for at least 3 days prior to this admit w/ copious drainage around driveline. CT chest/abd/pelvis showed stranding around the driveline and small amount of fluid. Started on Vanc and Meropenum per ID. Now off meropenem (2/21).  OR for debridement w/ wound vac placement 2/20. Wound culture with MRSA, blood cultures negative.  Afebrile.  - Will need 6 wks abx. Vanc switched to dapto 2/27 with nausea. Now on dapto/minocycline. Once dapto complete continue suppressive minocycline   - Currently AF. Site looks ok  - Plan to return to OR for washout/vac change 3/6.  - INR 2.5 today. Dosing per pharmD  2. Chronic HFrEF: Echo 10/21 with EF 20-25%, mildly decreased RV function. LHC/RHC in 12/21 with patent grafts, low output. Suspect mixed ischemic/nonischemic cardiomyopathy (prior heavy ETOH and drugs as well as CAD).  No ETOH, drugs, smoking since CABG in 12/20. Admitted with cardiogenic shock  in 12/21, had placement of Impella 5.5 initially, now s/p Heartmate 3 LVAD on 11/17/20.  Ramp echo 2/23 with speed decreased to 5500 rpm.  - Volume status ok. Po intake has been poor, received 1L IVF 3/3 and again 3.5.   - Warfarin restarted, goal INR 1.8-2.2 until surgery. INR 2.5. Now off low-dose heparin Discussed dosing with PharmD. - Not a candidate for transplant with ongoing drug use.  3. HTN: MAPs elevated low 100s on admit, poor compliance with meds. Has fluctuated significantly while admitted. MAPs elevated this am in setting of emotional distress - Can continue Toprol XL 25 mg daily  - Entresto stopped due to lower Maps.   - BP more stable on hydralazine PRN - Has spikes of HTN with n/v. ? Drug withdrawal 4. Polysubstance Abuse:  Recently started using methamphetamine again.  UDS + for amphetamines.  - SW team following. Unfortunately, limited resources for inpatient substance use due LVAD - Having paroxysms of n/v and HTN. suspect withdrawal from meth. - Will continue CIWA protocol for now  - Psych saw patient 3/3, now on remeron 5. PAF: S/p Maze and LA appendage clip with CABG in 12/20.  NSR this admission.  6. 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  Still some wheezing but has improved.   - Continue scheduled atrovent.  - Levalbuterol prn.  - Have avoided prednisone with surgery at driveline site for recurrent infection.   7. DMII - SSI  8.  NSVT/PVCs: Frequent NSVT 2/23, may have been dry after diuresis.  Amiodarone gtt started to suppress.  - Continue po amiodarone 200 mg bid.  - Would eventually stop this medication, avoid with COPD.   9. Nausea/vomiting - Began 2/27. Assumed 2/2 vancomycin initially however vanc switched to daptomycin and nausea continues.  - KUB ok - suspect etiology due to severe emotional distress/anxiety +/- substance abuse WD  10. Major depression - Patient refusing to work with PT, not motivated and expresses frustration with his overall situation.  Ongoing  drug use, no longer living with his wife.  Has been living with his mother who cannot care for him.  - Now saying that he would like his VAD to be off so that his wife and mother don't have him in common any more  - Psych consulted and saw patient, now on remeron 11. AKI - SCr 1.15>1.42>1.17. Suspect 2/2 #9. Improved w/ IVFs 12. NSVT: 16 beat run this am. K stable at 4.1 - check Mg level, supp PRN to keep > 2.0 - cont amiodarone   I reviewed the LVAD parameters from today, and compared the results to the patient's prior recorded data.  No programming changes were made.  The LVAD is functioning within specified parameters.  The patient performs LVAD self-test daily.  LVAD interrogation was negative for any significant power changes, alarms or PI events/speed drops.  LVAD equipment check completed and is in good working order.  Back-up equipment present.   LVAD education done on emergency procedures and precautions and reviewed exit site care.  Length of Stay: [address] PA-C 02/12/2023, 7:12 AM  VAD Team --- VAD ISSUES ONLY--- Pager [phone] (7am - 7am)  Advanced Heart Failure Team  Pager [phone] (M-F; 7a - 5p)  Please contact [facility] for night-coverage after hours (5p -7a ) and weekends on [email or web address]

## 2023-02-12 NOTE — Op Note (Signed)
NAME: Kevin Underwood, Kevin Underwood MEDICAL RECORD NO: [id number] ACCOUNT NO: [id number] DATE OF BIRTH: [birth year] FACILITY: [facility] LOCATION: [facility] PHYSICIAN: Ivin Poot III, MD  Operative Report   DATE OF PROCEDURE: 02/12/2023  OPERATION: 1.  Wound irrigation with non-excisional debridement. 2.  Removal of wound VAC system. 3.  Irrigation of wound with wound irrigant, hypochlorous acid with wet to dry wound packing. 4. 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   ANESTHESIA:  MAC, monitored intravenous sedation.  DESCRIPTION OF PROCEDURE:  The patient was checked in preoperative holding where informed consent was documented and final issues discussed with the patient regarding the procedure, so that he understood the benefits, risks and alternatives.  The patient  was brought to the operating room by the anesthesia team.  He was placed on the OR table supine.  He was given monitored IV sedation.  The previous wound VAC sheets were removed as well as the sponge.  The upper abdomen and lower chest were prepped and  draped around the wound as a sterile field.  A proper timeout was performed.  The wound was inspected.  There was no purulence.  There was some incorporation of the previous tissue matrix placed at the previous operation.  The wound measured 5 cm long by 2.5 cm wide by 1.5 cm deep.  There was no exposed portion of the VAD pump except for the power cord.  The wound was irrigated with hypochlorous acid wound irrigation, there was no necrotic tissue to sharply debride.  Next, the tissue matrix SDRM was cut to the appropriate sizes and configuration to fill the wound and to cover the surface of the bottom  and sides and top of the wound.  After the material was used to cover the power cord tunnel, an adhesive net was  placed around the power cord and on top of the net, a wet to dry dressing using the hypochlorous acid wound irrigation was placed and sterile  dressing was applied.  The intravenous sedation was stopped and the patient was then monitored and taken back to the recovery room.  The patient was tended to by the VAD coordinator for the entire procedure to run the VAD equipment, monitor the pump  function and to assist with hemodynamic management.  The VAD coordinator accompanied the patient to the recovery room for further management and monitoring.   SHW D: 02/12/2023 3:30:13 pm T: 02/12/2023 8:57:00 pm  JOB: [id number]/ [id number]

## 2023-02-12 NOTE — Anesthesia Postprocedure Evaluation (Signed)
Anesthesia Post Note  Patient: Kevin Underwood.  Procedure(s) Performed: ABDOMINAL DRIVELINE WOUND DEBRIDEMENT AND APPLICATION OF MATRIX WOUND DRESSING (Abdomen) REMOVAL OF WOUND VAC DRESSING (Abdomen)     Patient location during evaluation: PACU Anesthesia Type: MAC Level of consciousness: awake and alert Pain management: pain level controlled Vital Signs Assessment: post-procedure vital signs reviewed and stable Respiratory status: spontaneous breathing Cardiovascular status: stable Anesthetic complications: no   No notable events documented.  Last Vitals:  Vitals:   02/12/23 1537 02/12/23 1933  BP: 95/71 95/75  Pulse:  78  Resp: 13 (!) 24  Temp: 36.6 C 37 C  SpO2:  98%    Last Pain:  Vitals:   02/12/23 1933  TempSrc: Oral  PainSc:                  Nolon Nations

## 2023-02-12 NOTE — Transfer of Care (Signed)
Immediate Anesthesia Transfer of Care Note  Patient: Kevin Underwood.  Procedure(s) Performed: ABDOMINAL DRIVELINE WOUND DEBRIDEMENT WOUND VAC CHANGE  Patient Location: PACU  Anesthesia Type:MAC  Level of Consciousness: awake, alert , and oriented  Airway & Oxygen Therapy: Patient Spontanous Breathing  Post-op Assessment: Report given to RN and Post -op Vital signs reviewed and stable  Post vital signs: Reviewed and stable  Last Vitals:  Vitals Value Taken Time  BP 90/79 02/12/23 1505  Temp 36.4 C 02/12/23 1505  Pulse 114 02/12/23 1513  Resp 14 02/12/23 1516  SpO2 74 % 02/12/23 1513  Vitals shown include unvalidated device data.  Last Pain:  Vitals:   02/12/23 1505  TempSrc:   PainSc: 0-No pain      Patients Stated Pain Goal: 0 ([phone]  Complications: No notable events documented.

## 2023-02-12 NOTE — Progress Notes (Signed)
Pre Procedure note for inpatients:   Laquintin Atkison. has been scheduled for Procedure(s): ABDOMINAL DRIVELINE WOUND DEBRIDEMENT (N/A) WOUND VAC CHANGE (N/A) today. The various methods of treatment have been discussed with the patient. After consideration of the risks, benefits and treatment options the patient has consented to the planned procedure.   The patient has been seen and labs reviewed. There are no changes in the patient's condition to prevent proceeding with the planned procedure today.  Recent labs:  Lab Results  Component Value Date   WBC 10.8 (H) 02/12/2023   HGB 11.9 (L) 02/12/2023   HCT 35.4 (L) 02/12/2023   PLT 324 02/12/2023   GLUCOSE 152 (H) 02/12/2023   CHOL 142 09/11/2021   TRIG 95 09/11/2021   HDL 62 09/11/2021   LDLCALC 61 09/11/2021   ALT 17 01/27/2023   AST 18 01/27/2023   NA 135 02/12/2023   K 4.1 02/12/2023   CL 100 02/12/2023   CREATININE 1.17 02/12/2023   BUN 18 02/12/2023   CO2 25 02/12/2023   TSH 11.044 (H) 01/15/2023   INR 2.5 (H) 02/12/2023   HGBA1C 8.2 (H) 05/13/2022   Patient stable and VAD parameters are satisfactory. [name], MD 02/12/2023 8:11 AM

## 2023-02-12 NOTE — Progress Notes (Signed)
LVAD Coordinator Rounding Note:  Admitted 01/27/23 to Heart Failure service from ED due to driveline infection. Started on IV antibiotics.   S/p driveline debridement in OR 2/21 & 2/28. Wound vac dressing clean, dry, intact. Scant serosanguinous drainage in canister. Plan to return to OR for washout today per Dr Prescott Gum. VAD Coordinator to accompany pt to OR.   Pt laying in bed. States he had a better night. No reported nausea or vomiting. Psych consulted over the weekend for suicidal ideation/depression. Patient now on Remeron. Pysch documented that pt was not a threat to himself or others.    Pt will need wound vac and home IV antibiotics at discharge. Pt will need placement in SNF for IV antibiotics at discharge as he is living with his mom and she is unable to help him with care/medication administration. VAD coordinator spoke with his wife last week and they are not currently getting along. It would be much safer for him to be in a SNF post discharge for IV antibiotics. Inpatient social work awaiting bed offers for placement. Pt has been denied admission to [facility], [facility], and [facility]. Search for placement ongoing.   Controller electively exchanged this admission. No further low voltage hazard alarms seen on interrogation.  Vital signs: Temp:  98.1 HR: 91 Doppler Pressure: 98 Automatic BP: 121/91 (99) O2 Sat: 96% on RA Wt: 192.9>197>195.9>190.9>187.6>188.9>184.1>189.6>182.3>181.8>182.3>183.4 lbs  LVAD interrogation reveals:  Speed: 5600 Flow: 3.2 Power: 4.3 w PI: 9.6 Alarms: none Events: 80 today: 50+ yesterday Hct: 35  Fixed speed:  5600 Low speed limit:  5300  Drive Line:  Wound vac dressing clean, dry, intact.  Anchor in place x 2. Suction -100. Suctioning well, no alarms noted. Small amount of serosanguinous drainage noted in canister. Plan for washout and wound vac change in OR tomorrow per Dr Prescott Gum.   Labs:  LDH trend:  203>187>142>145>139>161>155>148>152>149>166>151>156  INR trend: 1.1>1.1>1.4>1.9>2.2>2.4>1.8>1.9>2.2>2.5  Anticoagulation Plan: -INR Goal: 2.0 - 2.5 -ASA Dose: none -Heparin 500 u/hr -off  ICD: N/A  Infection:  01/27/23>> blood cultures>> no growth 4 days 01/27/23>>driveline culture>>FEW METHICILLIN RESISTANT STAPHYLOCOCCUS AUREUS  01/28/23>>driveline cx from OR>>FEW METHICILLIN RESISTANT STAPHYLOCOCCUS AUREUS; final  Plan/Recommendations:  Page VAD coordinator with equipment issues or driveline problems VAD coord will accompany to OR Wednesday for washout with Dr Lucianne Lei Lytle Michaels RN,BSN [place] Coordinator  Office: [phone]  24/7 Pager: [phone]

## 2023-02-12 NOTE — Progress Notes (Signed)
[place] for  warfarin  Indication: LVAD  Allergies  Allergen Reactions   Vancomycin Nausea And Vomiting and Other (See Comments)    Severe joint pain    Patient Measurements: Height: '5\' 10"'$  (177.8 cm) Weight: 83.2 kg (183 lb 6.8 oz) IBW/kg (Calculated) : 73   Vital Signs: Temp: 97.8 F (36.6 C) (03/06 1537) Temp Source: Oral (03/06 1537) BP: 95/71 (03/06 1537) Pulse Rate: 83 (03/06 1520)  Labs: Recent Labs    02/10/23 0021 02/11/23 0605 02/12/23 0615  HGB 11.8* 12.5* 11.9*  HCT 35.2* 36.8* 35.4*  PLT 320 330 324  LABPROT 21.9* 24.5* 26.5*  INR 1.9* 2.2* 2.5*  HEPARINUNFRC <0.10*  --   --   CREATININE 1.15 1.42* 1.17  CKTOTAL  --  113  --      Estimated Creatinine Clearance: 67.6 mL/min (by C-G formula based on SCr of 1.17 mg/dL).   Medical History: Past Medical History:  Diagnosis Date   "    Arthritis    CAD (coronary artery disease)    a. s/p CABG in 11/2019 with LIMA-LAD, SVG-OM1, SVG-PDA and SVG-D1   CHF (congestive heart failure) (HCC)    a. EF < 20% by echo in 11/2019   Essential hypertension    PAF (paroxysmal atrial fibrillation) (HCC)    Type 2 diabetes mellitus (HCC)     Medications:   albumin human     albumin human     DAPTOmycin (CUBICIN) 700 mg in sodium chloride 0.9 % IVPB 700 mg (02/11/23 2032)    Assessment: 62 yo male on chronic Coumadin for LVAD, poor compliance with outpatient checks.  INR on admission 1.1. S/p OR 2/20 for drive line infection debridement.   Previous Coumadin dose 6 mg daily except 4 mg on Tues, Thurs.  INR up 2.5  after restarting warfarin - labile INR seems to be more sensitive this admit  Hgb stable 10-11 plt stable 240-300  LDH stable at 149. No s/sx of bleeding. Underwent I&D with wound VAC change 2/28 and 3/6.    Goal of Therapy:  INR 2-2.4 (keeping low in anticipation of return to OR soon) Monitor platelets by anticoagulation protocol: Yes   Plan:  Warfarin  2.5 mgx1 tonight  Daily protime and cbc    Bonnita Nasuti Pharm.D. CPP, BCPS Clinical Pharmacist [phone] 02/12/2023 3:48 PM     [facility] pharmacy phone numbers are listed on [email or web address]

## 2023-02-12 NOTE — Progress Notes (Signed)
Mobility Specialist Progress Note:   02/12/23 1507  Mobility  Activity Off unit   Will follow-up as time allows.   Gareth Eagle Previn Jian Mobility Specialist Please contact via [employer] or  Rehab Office at [phone]

## 2023-02-12 NOTE — Brief Op Note (Signed)
01/27/2023 - 02/12/2023  3:03 PM  PATIENT:  Kevin Underwood.  62 y.o. male  PRE-OPERATIVE DIAGNOSIS:  DRIVELINE INFECTION  POST-OPERATIVE DIAGNOSIS:  * VAD driveline Tunnel Infection  PROCEDURE:  Irrigation of Wound with wound Irrigant, removal of VAC sponge and system, application of SPDR tissue matrix,, wet/dry dressing over wound tissue matrix ,  SURGEON:  Surgeon(s) and Role:    [name], MD - Primary  PHYSICIAN ASSISTANT:NA   ASSISTANTS: none   ANESTHESIA:   MAC  EBL:  0 mL   BLOOD ADMINISTERED:none  DRAINS: none   LOCAL MEDICATIONS USED: no  SPECIMEN:  No Specimen  DISPOSITION OF SPECIMEN:  N/A  COUNTS:  YES  TOURNIQUET:  * No tourniquets in log *  DICTATION: .Dragon Dictation  PLAN OF CARE:  return to [place]:  PACU - hemodynamically stable.   Delay start of Pharmacological VTE agent (>24hrs) due to surgical blood loss or risk of bleeding: no  Plan for wound- daily wet/dry dressing changes with Vashe wound solution

## 2023-02-12 NOTE — Plan of Care (Signed)
  Problem: Education: Goal: Patient will understand all VAD equipment and how it functions Outcome: Progressing   Problem: Education: Goal: Knowledge of General Education information will improve Description: Including pain rating scale, medication(s)/side effects and non-pharmacologic comfort measures Outcome: Progressing   Problem: Health Behavior/Discharge Planning: Goal: Ability to manage health-related needs will improve Outcome: Progressing   Problem: Clinical Measurements: Goal: Ability to maintain clinical measurements within normal limits will improve Outcome: Progressing   Problem: Nutrition: Goal: Adequate nutrition will be maintained Outcome: Progressing   Problem: Coping: Goal: Level of anxiety will decrease Outcome: Progressing   Problem: Pain Managment: Goal: General experience of comfort will improve Outcome: Progressing   Problem: Safety: Goal: Ability to remain free from injury will improve Outcome: Progressing

## 2023-02-13 ENCOUNTER — Inpatient Hospital Stay (HOSPITAL_COMMUNITY): Payer: 59

## 2023-02-13 ENCOUNTER — Encounter (HOSPITAL_COMMUNITY): Payer: Self-pay | Admitting: Cardiothoracic Surgery

## 2023-02-13 DIAGNOSIS — T829XXD Unspecified complication of cardiac and vascular prosthetic device, implant and graft, subsequent encounter: Secondary | ICD-10-CM | POA: Diagnosis not present

## 2023-02-13 LAB — BASIC METABOLIC PANEL
Anion gap: 10 (ref 5–15)
Anion gap: 8 (ref 5–15)
BUN: 26 mg/dL — ABNORMAL HIGH (ref 8–23)
BUN: 29 mg/dL — ABNORMAL HIGH (ref 8–23)
CO2: 22 mmol/L (ref 22–32)
CO2: 27 mmol/L (ref 22–32)
Calcium: 8.8 mg/dL — ABNORMAL LOW (ref 8.9–10.3)
Calcium: 8.8 mg/dL — ABNORMAL LOW (ref 8.9–10.3)
Chloride: 105 mmol/L (ref 98–111)
Chloride: 107 mmol/L (ref 98–111)
Creatinine, Ser: 1.46 mg/dL — ABNORMAL HIGH (ref 0.61–1.24)
Creatinine, Ser: 1.57 mg/dL — ABNORMAL HIGH (ref 0.61–1.24)
GFR, Estimated: 50 mL/min — ABNORMAL LOW (ref >60)
GFR, Estimated: 54 mL/min — ABNORMAL LOW (ref >60)
Glucose, Bld: 114 mg/dL — ABNORMAL HIGH (ref 70–99)
Glucose, Bld: 89 mg/dL (ref 70–99)
Potassium: 4.2 mmol/L (ref 3.5–5.1)
Potassium: 4.3 mmol/L (ref 3.5–5.1)
Sodium: 139 mmol/L (ref 135–145)
Sodium: 140 mmol/L (ref 135–145)

## 2023-02-13 LAB — GLUCOSE, CAPILLARY
Glucose-Capillary: 119 mg/dL — ABNORMAL HIGH (ref 70–99)
Glucose-Capillary: 74 mg/dL (ref 70–99)
Glucose-Capillary: 103 mg/dL — ABNORMAL HIGH (ref 70–99)
Glucose-Capillary: 139 mg/dL — ABNORMAL HIGH (ref 70–99)

## 2023-02-13 LAB — CBC
HCT: 29 % — ABNORMAL LOW (ref 39.0–52.0)
Hemoglobin: 9.9 g/dL — ABNORMAL LOW (ref 13.0–17.0)
MCH: 29 pg (ref 26.0–34.0)
MCHC: 34.1 g/dL (ref 30.0–36.0)
MCV: 85 fL (ref 80.0–100.0)
Platelets: 240 10*3/uL (ref 150–400)
RBC: 3.41 MIL/uL — ABNORMAL LOW (ref 4.22–5.81)
RDW: 14.2 % (ref 11.5–15.5)
WBC: 10.5 10*3/uL (ref 4.0–10.5)
nRBC: 0 % (ref 0.0–0.2)

## 2023-02-13 LAB — PROTIME-INR
INR: 2.4 — ABNORMAL HIGH (ref 0.8–1.2)
Prothrombin Time: 25.6 seconds — ABNORMAL HIGH (ref 11.4–15.2)

## 2023-02-13 LAB — LACTATE DEHYDROGENASE: LDH: 158 U/L (ref 98–192)

## 2023-02-13 MED ORDER — WARFARIN SODIUM 4 MG PO TABS
4.0000 mg | ORAL_TABLET | Freq: Once | ORAL | Status: AC
  Administered 2023-02-13: 4 mg via ORAL
  Filled 2023-02-13: qty 1

## 2023-02-13 MED ORDER — LORAZEPAM 2 MG/ML IJ SOLN
INTRAMUSCULAR | Status: AC
  Filled 2023-02-13: qty 1

## 2023-02-13 MED ORDER — MAGNESIUM SULFATE 2 GM/50ML IV SOLN
2.0000 g | Freq: Once | INTRAVENOUS | Status: AC
  Administered 2023-02-13: 2 g via INTRAVENOUS
  Filled 2023-02-13: qty 50

## 2023-02-13 MED ORDER — LORAZEPAM 2 MG/ML IJ SOLN
1.0000 mg | Freq: Once | INTRAMUSCULAR | Status: AC
  Administered 2023-02-13: 1 mg via INTRAVENOUS

## 2023-02-13 NOTE — Progress Notes (Signed)
Pt c/o anxiety, abdominal discomfort, nausea and just not feeling right.  Doppler 130, LVAD numbers FLow 2.9-3.6, Watts 4, PI 9-10.  Hydralazine 10 mg IV given, Compazine given.  LVAD coordinator [place] paged.  Discussed patient status and numbers and interventions.  Case discussed with MD on call.  Orders received.

## 2023-02-13 NOTE — Plan of Care (Signed)
  Problem: Education: Goal: Patient will understand all VAD equipment and how it functions Outcome: Progressing Goal: Patient will be able to verbalize current INR target range and antiplatelet therapy for discharge home Outcome: Progressing   Problem: Cardiac: Goal: LVAD will function as expected and patient will experience no clinical alarms Outcome: Progressing   Problem: Education: Goal: Knowledge of General Education information will improve Description: Including pain rating scale, medication(s)/side effects and non-pharmacologic comfort measures Outcome: Progressing   Problem: Health Behavior/Discharge Planning: Goal: Ability to manage health-related needs will improve Outcome: Progressing   Problem: Clinical Measurements: Goal: Ability to maintain clinical measurements within normal limits will improve Outcome: Progressing Goal: Will remain free from infection Outcome: Progressing Goal: Diagnostic test results will improve Outcome: Progressing Goal: Respiratory complications will improve Outcome: Progressing Goal: Cardiovascular complication will be avoided Outcome: Progressing   Problem: Activity: Goal: Risk for activity intolerance will decrease Outcome: Progressing   Problem: Nutrition: Goal: Adequate nutrition will be maintained Outcome: Progressing   Problem: Coping: Goal: Level of anxiety will decrease Outcome: Progressing   Problem: Elimination: Goal: Will not experience complications related to bowel motility Outcome: Progressing Goal: Will not experience complications related to urinary retention Outcome: Progressing   Problem: Pain Managment: Goal: General experience of comfort will improve Outcome: Progressing   Problem: Safety: Goal: Ability to remain free from injury will improve Outcome: Progressing   Problem: Skin Integrity: Goal: Risk for impaired skin integrity will decrease Outcome: Progressing   

## 2023-02-13 NOTE — Progress Notes (Signed)
Mobility Specialist Progress Note:   02/13/23 1000  Mobility  Activity Ambulated with assistance in hallway  Level of Assistance Modified independent, requires aide device or extra time  Assistive Device None  Distance Ambulated (ft) 700 ft  Activity Response Tolerated well  $Mobility charge 1 Mobility   Pt in chair willing to participate in mobility. No complaints of pain. Left in chair with call bell in reach and all needs met.   Gareth Eagle Izabell Schalk Mobility Specialist Please contact via [employer] or  Rehab Office at [phone]

## 2023-02-13 NOTE — Plan of Care (Signed)
  Problem: Cardiac: Goal: LVAD will function as expected and patient will experience no clinical alarms Outcome: Progressing   Problem: Education: Goal: Knowledge of General Education information will improve Description: Including pain rating scale, medication(s)/side effects and non-pharmacologic comfort measures Outcome: Progressing   Problem: Health Behavior/Discharge Planning: Goal: Ability to manage health-related needs will improve Outcome: Progressing   Problem: Clinical Measurements: Goal: Ability to maintain clinical measurements within normal limits will improve Outcome: Progressing Goal: Will remain free from infection Outcome: Progressing   Problem: Nutrition: Goal: Adequate nutrition will be maintained Outcome: Progressing   Problem: Coping: Goal: Level of anxiety will decrease Outcome: Progressing   Problem: Elimination: Goal: Will not experience complications related to bowel motility Outcome: Progressing Goal: Will not experience complications related to urinary retention Outcome: Progressing   Problem: Pain Managment: Goal: General experience of comfort will improve Outcome: Progressing   Problem: Safety: Goal: Ability to remain free from injury will improve Outcome: Progressing   Problem: Skin Integrity: Goal: Risk for impaired skin integrity will decrease Outcome: Progressing

## 2023-02-13 NOTE — Progress Notes (Signed)
Pt appears to be resting quietly in bed after ativan given.  Lights out for comfort.

## 2023-02-13 NOTE — Progress Notes (Signed)
Patient ID: Kevin Millers., male   DOB: 04/25/1961, 62 y.o.   MRN: [id number]   Advanced Heart Failure VAD Team Note  PCP-Cardiologist: Rozann Lesches, MD  AHF: Dr. Aundra Dubin   Subjective:   Driveline site infection.  Wound Cx with MRSA. On IV vancomycin.  OR for debridement w/ wound vac placement 2/20 and 2/28.  Psych consulted and saw patient 3/3 for suicidal ideation/depression. Patient now on Remeron.   AKI improved w/ IVFs, Scr 1.42>>1.17. Today SCr up to 1.57.    Underwent I&D with wound vac removal yesterday , INR 2.4   K 4.3, Mg 1.8.   Feels great this morning, looks like a whole new person. Sitting in chair, eating and drinking. Able to ambulate yesterday down the hall. Denies pain.    LVAD INTERROGATION:  HeartMate III LVAD:   Flow 4.1 liters/min, speed 5600, power 4.2, PI 3.2. 21 PI events.   Objective:    Vital Signs:   Temp:  [97.5 F (36.4 C)-98.7 F (37.1 C)] 98.2 F (36.8 C) (03/07 0552) Pulse Rate:  [78-85] 81 (03/07 0552) Resp:  [13-24] 13 (03/07 0552) BP: (68-114)/(55-91) 114/91 (03/07 0552) SpO2:  [96 %-98 %] 97 % (03/07 0552) Weight:  [86.9 kg] 86.9 kg (03/07 0530) Last BM Date : 02/07/23 Mean arterial Pressure 70s-80s  Intake/Output:   Intake/Output Summary (Last 24 hours) at 02/13/2023 0704 Last data filed at 02/13/2023 0552 Gross per 24 hour  Intake 1612.07 ml  Output 1825 ml  Net -212.93 ml    Physical Exam   General:  Well appearing. No resp difficulty HEENT: Normal Neck: supple. JVP ~6/7. Carotids 2+ bilat; no bruits. No lymphadenopathy or thyromegaly appreciated. Cor: Mechanical heart sounds with LVAD hum present. Lungs: Clear Abdomen: soft, nontender, nondistended. No hepatosplenomegaly. No bruits or masses. Good bowel sounds. Driveline: C/D/I; securement device needs replacement. Driveline surrounded by gauze/medipore tape C/D/I Extremities: no cyanosis, clubbing, rash, edema Neuro: alert & orientedx3, cranial nerves grossly  intact. moves all 4 extremities w/o difficulty. Affect pleasant   Telemetry   NSR 80s (Personally reviewed)    Labs   Basic Metabolic Panel: Recent Labs  Lab 02/09/23 0034 02/10/23 0021 02/11/23 0605 02/12/23 0615 02/13/23 0017  NA 137 135 134* 135 139  K 3.9 3.8 4.2 4.1 4.3  CL 106 103 102 100 107  CO2 '24 22 24 25 22  '$ GLUCOSE 103* 120* 149* 152* 114*  BUN '18 14 19 18 '$ 26*  CREATININE 1.35* 1.15 1.42* 1.17 1.57*  CALCIUM 8.6* 9.1 9.0 9.0 8.8*  MG  --  1.9  --  1.8  --    Liver Function Tests: No results for input(s): "AST", "ALT", "ALKPHOS", "BILITOT", "PROT", "ALBUMIN" in the last 168 hours.  CBC: Recent Labs  Lab 02/09/23 0034 02/10/23 0021 02/11/23 0605 02/12/23 0615 02/13/23 0017  WBC 7.4 7.5 9.3 10.8* 10.5  HGB 10.5* 11.8* 12.5* 11.9* 9.9*  HCT 30.2* 35.2* 36.8* 35.4* 29.0*  MCV 82.5 83.2 82.7 83.3 85.0  PLT 268 320 330 324 240   INR: Recent Labs  Lab 02/09/23 0034 02/10/23 0021 02/11/23 0605 02/12/23 0615 02/13/23 0017  INR 1.8* 1.9* 2.2* 2.5* 2.4*    Other results:  Imaging   No results found.  Medications:     Scheduled Medications:  allopurinol  100 mg Oral Daily   amiodarone  200 mg Oral BID   feeding supplement (GLUCERNA SHAKE)  237 mL Oral TID WC   glipiZIDE  5 mg Oral Q breakfast  guaiFENesin  600 mg Oral BID   insulin aspart  0-24 Units Subcutaneous TID WC   insulin aspart  0-5 Units Subcutaneous QHS   loratadine  10 mg Oral Daily   metFORMIN  500 mg Oral BID WC   metoprolol succinate  25 mg Oral Daily   minocycline  200 mg Oral BID   mirtazapine  15 mg Oral QHS   multivitamin with minerals  1 tablet Oral Q24H   neomycin-bacitracin-polymyxin   Topical Daily   pantoprazole  40 mg Oral Daily   sertraline  50 mg Oral Daily   tamsulosin  0.4 mg Oral Daily   thiamine  100 mg Oral Daily   traZODone  150 mg Oral QHS   Warfarin - Pharmacist Dosing Inpatient   Does not apply q1600   zinc sulfate  220 mg Oral Daily     Infusions:  DAPTOmycin (CUBICIN) 700 mg in sodium chloride 0.9 % IVPB Stopped (02/12/23 2334)    PRN Medications: acetaminophen, colchicine, fluticasone, hydrALAZINE, ipratropium, levalbuterol, ondansetron (ZOFRAN) IV, mouth rinse, prochlorperazine  Patient Profile  62 y.o. 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 type 2 diabetes mellitus, COPD, hypertension, and HMIII VAD.    Admitted with recurrent LVAD complication driveline infection + suspected AECOPD Assessment/Plan:   1. LVAD Complication---> Recurrent driveline infection: MRSA driveline infection, admitted in 6/23 and again in 7/23-8/23 with multiple trips to the OR for debridement. He had completed vancomycin and then long-term doxycycline. Most recent culture PTA grew acinetobacter and MRSA. ID started minocycline. He had been off minocycline for at least 3 days prior to this admit w/ copious drainage around driveline. CT chest/abd/pelvis showed stranding around the driveline and small amount of fluid. Started on Vanc and Meropenum per ID. Now off meropenem (2/21).  OR for debridement w/ wound vac placement 2/20. Wound culture with MRSA, blood cultures negative.  Afebrile.  - Will need 6 wks abx. Vanc switched to dapto 2/27 with nausea. Now on dapto/minocycline. Once dapto complete continue suppressive minocycline   - Currently AF. Site looks ok  - Returned to OR for I&D and wound vac removal 3/6.  - INR 2.4 today. Dosing per pharmD  2. Chronic HFrEF: Echo 10/21 with EF 20-25%, mildly decreased RV function. LHC/RHC in 12/21 with patent grafts, low output. Suspect mixed ischemic/nonischemic cardiomyopathy (prior heavy ETOH and drugs as well as CAD).  No ETOH, drugs, smoking since CABG in 12/20. Admitted with cardiogenic shock in 12/21, had placement of Impella 5.5  initially, now s/p Heartmate 3 LVAD on 11/17/20.  Ramp echo 2/23 with speed decreased to 5500 rpm.  - Volume status ok. Po intake has been poor, received 1L IVF 3/3 and again 3.5.  Today volume intake improving, has drank almost 3 gatorades - Warfarin restarted, goal INR 1.8-2.2 pre-surgery. INR 2.4. Now off low-dose heparin Discussed dosing with PharmD. - Not a candidate for transplant with ongoing drug use.  3. HTN: MAPs elevated low 100s on admit, poor compliance with meds. Has fluctuated significantly while admitted. MAPs elevated this am in setting of emotional distress - Can continue Toprol XL 25 mg daily  - Entresto stopped due to lower Maps.   - BP more stable on hydralazine PRN - Has spikes of HTN with n/v. ? Drug withdrawal 4. Polysubstance Abuse: Recently started using methamphetamine again.  UDS + for  amphetamines.  - SW team following. Unfortunately, limited resources for inpatient substance use due LVAD - Having paroxysms of n/v and HTN. suspect withdrawal from meth. - Will continue CIWA protocol for now  - Psych saw patient 3/3, now on remeron 5. PAF: S/p Maze and LA appendage clip with CABG in 12/20.  NSR this admission.  6. 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  Still some wheezing but has improved.   - Continue scheduled atrovent.  - Levalbuterol prn.  - Have avoided prednisone with surgery at driveline site for recurrent infection.   7. DMII - SSI  8.  NSVT/PVCs: Frequent NSVT 2/23, may have been dry after diuresis.  Amiodarone gtt started to suppress.  - Continue po amiodarone 200 mg bid.  - Would eventually stop this medication, avoid with COPD.   9. Nausea/vomiting - Began 2/27. Assumed 2/2 vancomycin initially however vanc switched to daptomycin and nausea continues.  - KUB ok - suspect etiology due to severe emotional distress/anxiety +/- substance abuse WD  10. Major depression - Patient refusing to work with PT, not motivated and expresses frustration with his overall  situation.  Ongoing drug use, no longer living with his wife.  Has been living with his mother who cannot care for him.  - Now saying that he would like his VAD to be off so that his wife and mother don't have him in common any more  - Psych consulted and saw patient, now on remeron 11. AKI - SCr 1.15>1.42>1.17. Suspect 2/2 #9. Improved w/ IVFs - today SCr up to 1.57 however PO intake has significantly increased. Will recheck around noon.  12. NSVT: No further NSVT. K stable at 4.3 - Mg 1.8, replete - cont amiodarone   I reviewed the LVAD parameters from today, and compared the results to the patient's prior recorded data.  No programming changes were made.  The LVAD is functioning within specified parameters.  The patient performs LVAD self-test daily.  LVAD interrogation was negative for any significant power changes, alarms or PI events/speed drops.  LVAD equipment check completed and is in good working order.  Back-up equipment present.   LVAD education done on emergency procedures and precautions and reviewed exit site care.  Length of Stay: [place], NP 02/13/2023, 7:04 AM  VAD Team --- VAD ISSUES ONLY--- Pager [phone] (7am - 7am)  Advanced Heart Failure Team  Pager [phone] (M-F; 7a - 5p)  Please contact [facility] for night-coverage after hours (5p -7a ) and weekends on [email or web address]

## 2023-02-13 NOTE — Progress Notes (Addendum)
LVAD Coordinator Rounding Note:  Admitted 01/27/23 to Heart Failure service from ED due to driveline infection. Started on IV antibiotics.   S/p driveline debridement in OR 2/21,2/28 and 3/7. Wound vac dressing clean, dry, intact. Scant serosanguinous drainage in canister. Plan to return to OR for washout today per Dr Prescott Gum. VAD Coordinator to accompany pt to OR.   Pt sitting in chair on my arrival. States he feels much better this morning. No reported nausea or vomiting overnight. Psych consulted over the weekend for suicidal ideation/depression. Patient now on Remeron. Pysch documented that pt was not a threat to himself or others.    Pt will need home IV antibiotics at discharge. Pt will need placement in SNF for IV antibiotics at discharge as he is living with his mom and she is unable to help him with care/medication administration. VAD coordinator spoke with his wife last week and they are not currently getting along. It would be much safer for him to be in a SNF post discharge for IV antibiotics. Inpatient social work awaiting bed offers for placement. Pt has been denied admission to [facility] and [facility]. Awaiting potential approval at [facility] Hoping to hear back today. Search for placement ongoing.   Controller electively exchanged this admission. No further low voltage hazard alarms seen on interrogation.  Vital signs: Temp:  98 HR: 91 Doppler Pressure: 110 Automatic BP: 129/76 (90) O2 Sat: 96% on RA Wt: 192.9>197>195.9>190.9>187.6>188.9>184.1>189.6>182.3>181.8>182.3>183.4>191.6 lbs  LVAD interrogation reveals:  Speed: 5600 Flow: 4.2 Power: 4.1 w PI: 3.0 Alarms: none Events: 20 Hct: 35  Fixed speed:  5600 Low speed limit:  5300  Drive Line:  Existing VAD dressing removed and site care performed using sterile technique. Moderate amount of serosanguineous drainage noted in wound bed. Drive line exit site cleaned with Chlora prep applicators x 2, allowed to dry.  Rylon sheet intact.Vashe soaked 2x2 gauze placed on top of rylon sheet followed by dry gauze. No redness, foul odor, or tenderness noted. Drive line anchor replaced. Daily sterile wet to dry dressing changes per bedside RN or VAD coordinator. Next dressing change in tomorrow 02/14/23.   Labs:  LDH trend: 203>187>142>145>139>161>155>148>152>149>166>151>156>158  INR trend: 1.1>1.1>1.4>1.9>2.2>2.4>1.8>1.9>2.2>2.5>2.4  Anticoagulation Plan: -INR Goal: 2.0 - 2.5 -ASA Dose: none -Heparin 500 u/hr -off  ICD: N/A  Infection:  01/27/23>> blood cultures>> no growth 4 days 01/27/23>>driveline culture>>FEW METHICILLIN RESISTANT STAPHYLOCOCCUS AUREUS  01/28/23>>driveline cx from OR>>FEW METHICILLIN RESISTANT STAPHYLOCOCCUS AUREUS; final  Plan/Recommendations:  Page VAD coordinator with equipment issues or driveline problems Continue daily dressing changes by bedside nurse or VAD Coordinator.  Bobbye Morton RN,BSN [place] Coordinator  Office: [phone]  24/7 Pager: [phone]

## 2023-02-13 NOTE — Progress Notes (Signed)
[place] for  warfarin  Indication: LVAD  Allergies  Allergen Reactions   Vancomycin Nausea And Vomiting and Other (See Comments)    Severe joint pain    Patient Measurements: Height: '5\' 10"'$  (177.8 cm) Weight: 86.9 kg (191 lb 9.3 oz) IBW/kg (Calculated) : 73   Vital Signs: Temp: 98.5 F (36.9 C) (03/07 1111) Temp Source: Oral (03/07 1111) BP: 91/73 (03/07 1111) Pulse Rate: 90 (03/07 0726)  Labs: Recent Labs    02/11/23 0605 02/12/23 0615 02/13/23 0017 02/13/23 1238  HGB 12.5* 11.9* 9.9*  --   HCT 36.8* 35.4* 29.0*  --   PLT 330 324 240  --   LABPROT 24.5* 26.5* 25.6*  --   INR 2.2* 2.5* 2.4*  --   CREATININE 1.42* 1.17 1.57* 1.46*  CKTOTAL 113  --   --   --      Estimated Creatinine Clearance: 54.2 mL/min (A) (by C-G formula based on SCr of 1.46 mg/dL (H)).   Medical History: Past Medical History:  Diagnosis Date   "    Arthritis    CAD (coronary artery disease)    a. s/p CABG in 11/2019 with LIMA-LAD, SVG-OM1, SVG-PDA and SVG-D1   CHF (congestive heart failure) (HCC)    a. EF < 20% by echo in 11/2019   Essential hypertension    PAF (paroxysmal atrial fibrillation) (HCC)    Type 2 diabetes mellitus (HCC)     Medications:   DAPTOmycin (CUBICIN) 700 mg in sodium chloride 0.9 % IVPB Stopped (02/12/23 2334)    Assessment: 62 yo male on chronic Coumadin for LVAD, poor compliance with outpatient checks.  INR on admission 1.1. S/p OR 2/20 for drive line infection debridement.   Previous Coumadin dose 6 mg daily except 4 mg on Tues, Thurs.  INR up 2.4 after restarting warfarin - labile INR seems to be more sensitive this admit  Hgb stable 10-11 plt stable 240-300  LDH stable at 149. No s/sx of bleeding. Underwent I&D with wound VAC change 2/28 and 3/6.    Goal of Therapy:  INR 2-2.4 (keeping low in anticipation of return to OR soon) Monitor platelets by anticoagulation protocol: Yes   Plan:  Warfarin 4 mgx1  tonight  Daily PT/INR.   [place] Crane, Roylene Reason, BCCP Clinical Pharmacist  02/13/2023 2:07 PM   [facility] pharmacy phone numbers are listed on [email or web address]

## 2023-02-13 NOTE — TOC Initial Note (Signed)
Transition of Care (TOC) - Initial/Assessment Note    Patient Details  Name: Kevin Underwood. MRN: [id number] Date of Birth: Apr 24, 1961  Transition of Care [facility]) CM/SW Contact:    Erenest Rasher, RN Phone Number: [phone] 02/13/2023, 10:09 AM  Clinical Narrative:                 CM contacted [facility] rehab rep, Jackelyn Poling and they currently have no male beds. She will have DON review his referral for acceptance, pt was initially denied in SNF hub by their intake department.   Expected Discharge Plan: Skilled Nursing Facility Barriers to Discharge: Continued Medical Work up   Patient Goals and CMS Choice Patient states their goals for this hospitalization and ongoing recovery are:: wants to feel better CMS Medicare.gov Compare Post Acute Care list provided to:: Patient Choice offered to / list presented to : Patient      Expected Discharge Plan and Services   Discharge Planning Services: CM Consult Post Acute Care Choice: [place] Living arrangements for the past 2 months: Single Family Home                           HH Arranged: RN          Prior Living Arrangements/Services Living arrangements for the past 2 months: Single Family Home Lives with:: Parents Patient language and need for interpreter reviewed:: Yes Do you feel safe going back to the place where you live?: Yes      Need for Family Participation in Patient Care: Yes (Comment) Care giver support system in place?: Yes (comment) Current home services: DME (LVAD, scale, nebulizer machine) Criminal Activity/Legal Involvement Pertinent to Current Situation/Hospitalization: No - Comment as needed  Activities of Daily Living Home Assistive Devices/Equipment: None ADL Screening (condition at time of admission) Patient's cognitive ability adequate to safely complete daily activities?: Yes Is the patient deaf or have difficulty hearing?: No Does the patient have difficulty seeing,  even when wearing glasses/contacts?: No Does the patient have difficulty concentrating, remembering, or making decisions?: No Patient able to express need for assistance with ADLs?: Yes Does the patient have difficulty dressing or bathing?: No Independently performs ADLs?: Yes (appropriate for developmental age) Does the patient have difficulty walking or climbing stairs?: No Weakness of Legs: None Weakness of Arms/Hands: None  Permission Sought/Granted Permission sought to share information with : Case Manager, Family Supports, PCP    Share Information with NAME: Steffanie Dunn     Permission granted to share info w Relationship: mother  Permission granted to share info w Contact Information: [phone]  Emotional Assessment Appearance:: Appears stated age Attitude/Demeanor/Rapport: Engaged Affect (typically observed): Accepting Orientation: : Oriented to Self, Oriented to Place, Oriented to  Time, Oriented to Situation   Psych Involvement: No (comment)  Admission diagnosis:  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) [id number]] 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 (LVAD) ([place]) [id number]] Patient Active Problem List   Diagnosis Date Noted   Stimulant use disorder 02/09/2023   Depression 02/09/2023   Pain and swelling of right wrist 09/30/2022   Hypotension 08/07/2022   MRSA infection    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 (LVAD) ([place])    Left ventricular assist device (LVAD) complication, subsequent encounter 05/13/2022   AKI (acute kidney injury) ([place]) 05/13/2022   Hyperthyroidism [id number]   Chronic systolic heart failure (HCC)    COPD (chronic obstructive  pulmonary disease) ([place]) 09/09/2021   Malnutrition of moderate degree 11/17/2020   CHF (congestive heart failure), NYHA class IV, chronic, systolic ([place]) [id number]   Palliative care by specialist    Goals of care, counseling/discussion     Hyperlipidemia    CHF exacerbation ([place]) 09/23/2020   Unstable angina ([place]) 09/22/2020   LVAD (left ventricular assist device) present ([place])    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   Cardiogenic shock (HCC)    Acute respiratory failure ([place])    RUQ abdominal pain    Cardiomyopathy ([place]) [id number]   Acute systolic CHF (congestive heart failure) ([place]) 11/21/2019   History of alcohol abuse Quit 6 mos ago 11/21/2019   History of recreational drug use 11/21/2019   PAF (paroxysmal atrial fibrillation) ([place]) 11/21/2019   Cardiac volume overload 11/21/2019   Elevated brain natriuretic peptide (BNP) level 11/21/2019   Leg edema, right 11/21/2019   Hyponatremia 11/21/2019   Abnormal electrocardiogram (ECG) (EKG) 11/21/2019   Hyperglycemia 11/21/2019   Tremulousness 11/21/2019   Restlessness and agitation 11/21/2019   OSA (obstructive sleep apnea) 11/21/2019   Atrial fibrillation with RVR ([place]) 11/20/2019   Acute congestive heart failure ([place]) 11/20/2019   Acute respiratory distress 11/20/2019   COPD with acute exacerbation ([place])    Hypertension    Type 2 diabetes mellitus ([place])    Closed fracture of 5th metacarpal 08/31/2012   PCP:  Ralph Leyden, FNP Pharmacy:   [id number] [place] [id number]: [phone]: [phone]    Social Determinants of Health ([place] Social History: SDOH Screenings   Food Insecurity: No Food Insecurity (02/05/2023)  Housing: Low Risk  (02/05/2023)  Transportation Needs: Unmet Transportation Needs (02/05/2023)  Utilities: Not At Risk (02/05/2023)  Alcohol Screen: Low Risk  (11/10/2020)  Depression (PHQ2-9): Low Risk  (10/16/2022)  Financial Resource Strain: Medium Risk (05/17/2022)  Physical Activity: Inactive (11/10/2020)  Tobacco Use: Medium Risk (02/13/2023)   SDOH Interventions:     Readmission Risk Interventions    07/01/2022    9:05 AM 11/28/2020    11:57 AM  Readmission Risk Prevention Plan  Transportation Screening Complete Complete  Medication Review (RN Care Manager) Complete Complete  PCP or Specialist appointment within 3-5 days of discharge Complete   [place] or [place] Complete  SW Recovery Care/Counseling Consult Complete Complete  Palliative Care Screening Not Applicable [place] Applic[place] Not Applicable

## 2023-02-14 DIAGNOSIS — T829XXD Unspecified complication of cardiac and vascular prosthetic device, implant and graft, subsequent encounter: Secondary | ICD-10-CM | POA: Diagnosis not present

## 2023-02-14 LAB — BASIC METABOLIC PANEL
Anion gap: 12 (ref 5–15)
BUN: 18 mg/dL (ref 8–23)
CO2: 25 mmol/L (ref 22–32)
Calcium: 9.1 mg/dL (ref 8.9–10.3)
Chloride: 99 mmol/L (ref 98–111)
Creatinine, Ser: 1.06 mg/dL (ref 0.61–1.24)
GFR, Estimated: >60 mL/min (ref >60)
Glucose, Bld: 132 mg/dL — ABNORMAL HIGH (ref 70–99)
Potassium: 3.8 mmol/L (ref 3.5–5.1)
Sodium: 136 mmol/L (ref 135–145)

## 2023-02-14 LAB — CBC
HCT: 35.4 % — ABNORMAL LOW (ref 39.0–52.0)
Hemoglobin: 11.8 g/dL — ABNORMAL LOW (ref 13.0–17.0)
MCH: 27.9 pg (ref 26.0–34.0)
MCHC: 33.3 g/dL (ref 30.0–36.0)
MCV: 83.7 fL (ref 80.0–100.0)
Platelets: 278 10*3/uL (ref 150–400)
RBC: 4.23 MIL/uL (ref 4.22–5.81)
RDW: 14.4 % (ref 11.5–15.5)
WBC: 9.5 10*3/uL (ref 4.0–10.5)
nRBC: 0 % (ref 0.0–0.2)

## 2023-02-14 LAB — GLUCOSE, CAPILLARY
Glucose-Capillary: 170 mg/dL — ABNORMAL HIGH (ref 70–99)
Glucose-Capillary: 100 mg/dL — ABNORMAL HIGH (ref 70–99)
Glucose-Capillary: 152 mg/dL — ABNORMAL HIGH (ref 70–99)
Glucose-Capillary: 150 mg/dL — ABNORMAL HIGH (ref 70–99)

## 2023-02-14 LAB — LACTATE DEHYDROGENASE: LDH: 166 U/L (ref 98–192)

## 2023-02-14 LAB — AEROBIC/ANAEROBIC CULTURE W GRAM STAIN (SURGICAL/DEEP WOUND)

## 2023-02-14 LAB — PROTIME-INR
INR: 1.7 — ABNORMAL HIGH (ref 0.8–1.2)
Prothrombin Time: 19.4 seconds — ABNORMAL HIGH (ref 11.4–15.2)

## 2023-02-14 MED ORDER — POTASSIUM CHLORIDE CRYS ER 20 MEQ PO TBCR
20.0000 meq | EXTENDED_RELEASE_TABLET | Freq: Once | ORAL | Status: AC
  Administered 2023-02-14: 20 meq via ORAL
  Filled 2023-02-14: qty 1

## 2023-02-14 MED ORDER — CLONAZEPAM 0.5 MG PO TABS
0.5000 mg | ORAL_TABLET | Freq: Two times a day (BID) | ORAL | Status: DC
  Administered 2023-02-14 – 2023-02-15 (×4): 0.5 mg via ORAL
  Filled 2023-02-14 (×4): qty 1

## 2023-02-14 MED ORDER — WARFARIN SODIUM 3 MG PO TABS
6.0000 mg | ORAL_TABLET | Freq: Once | ORAL | Status: AC
  Administered 2023-02-14: 6 mg via ORAL
  Filled 2023-02-14: qty 2

## 2023-02-14 MED ORDER — SODIUM CHLORIDE 0.9 % IV BOLUS
250.0000 mL | Freq: Once | INTRAVENOUS | Status: AC
  Administered 2023-02-14: 250 mL via INTRAVENOUS

## 2023-02-14 MED ORDER — SODIUM CHLORIDE 0.9 % IV BOLUS
500.0000 mL | Freq: Once | INTRAVENOUS | Status: AC
  Administered 2023-02-14: 500 mL via INTRAVENOUS

## 2023-02-14 NOTE — Progress Notes (Signed)
2 Days Post-Op Procedure(s) (LRB): ABDOMINAL DRIVELINE WOUND DEBRIDEMENT AND APPLICATION OF MATRIX WOUND DRESSING (N/A) REMOVAL OF WOUND VAC DRESSING (N/A) Subjective: Nausea better today VAD tunnel dressing personally changed with hypochlorous acid wet/dry. Wound with drainage from tissue matrix but no purulence Will be able to trat with daily wet/dry saline dressing changes  at SNF with weekly clinic visits  Objective: Vital signs in last 24 hours: Temp:  [97.6 F (36.4 C)-98.1 F (36.7 C)] 97.7 F (36.5 C) (03/08 1100) Pulse Rate:  [80-106] 99 (03/08 1100) Cardiac Rhythm: Normal sinus rhythm (03/08 0741) Resp:  [15-20] 18 (03/08 1100) BP: (71-177)/(53-109) 89/78 (03/08 1100) SpO2:  [94 %-96 %] 96 % (03/08 1100)  Hemodynamic parameters for last 24 hours:  afebrile  Intake/Output from previous day: 03/07 0701 - 03/08 0700 In: 272 [P.O.:240; IV Piggyback:32] Out: 2550 [Urine:2550] Intake/Output this shift: Total I/O In: 240 [P.O.:240] Out: -        Exam    General- alert and comfortable. Wound dressing changed at bedside with sterile technique    Neck- no JVD, no cervical adenopathy palpable, no carotid bruit   Lungs- clear without rales, wheezes   Cor- regular rate and rhythm, normal VAD hum   Abdomen- soft, non-tender   Extremities - warm, non-tender, minimal edema   Neuro- oriented, appropriate, no focal weakness   Lab Results: Recent Labs    02/13/23 0017 02/14/23 0842  WBC 10.5 9.5  HGB 9.9* 11.8*  HCT 29.0* 35.4*  PLT 240 278   BMET:  Recent Labs    02/13/23 1238 02/14/23 0842  NA 140 136  K 4.2 3.8  CL 105 99  CO2 27 25  GLUCOSE 89 132*  BUN 29* 18  CREATININE 1.46* 1.06  CALCIUM 8.8* 9.1    PT/INR:  Recent Labs    02/14/23 0842  LABPROT 19.4*  INR 1.7*   ABG    Component Value Date/Time   PHART 7.508 (H) 11/20/2020 0438   HCO3 24.8 09/09/2021 0313   TCO2 29 11/20/2020 0438   ACIDBASEDEF 6.0 (H) 11/24/2019 1718   O2SAT 63.4  09/09/2021 0313   CBG (last 3)  Recent Labs    02/13/23 2103 02/14/23 0629 02/14/23 1112  GLUCAP 139* 170* 100*    Assessment/Plan: S/P Procedure(s) (LRB): ABDOMINAL DRIVELINE WOUND DEBRIDEMENT AND APPLICATION OF MATRIX WOUND DRESSING (N/A) REMOVAL OF WOUND VAC DRESSING (N/A) Cont iv antibiotics and daily VAD tunnel dressing changes   LOS: 18 days    Kevin Underwood 02/14/2023

## 2023-02-14 NOTE — TOC Progression Note (Signed)
Transition of Care (TOC) - Progression Note    Patient Details  Name: Kevin Underwood. MRN: [id number] Date of Birth: [birth year]  Transition of Care [facility]) CM/SW [place], [place] Phone Number: 02/14/2023, 4:51 PM  Clinical Narrative:    CSW touched base with TOC supervision and medical team again about the barriers to SNF placement and this pt. SNF placement continues to not be a realistic option. Central Intake is currently reviewing for one of their many facilities, but did not seem optimistic. CSW defers to HF team for next steps and alternate dispositions. If any updates arise, CSW to update team. TOC will continue to follow.    Expected Discharge Plan: [place] Barriers to Discharge: Continued Medical Work up  Expected Discharge Plan and Services   Discharge Planning Services: CM Consult Post Acute Care Choice: [place] Living arrangements for the past 2 months: [place]: RN           Social Determinants of Health (SDOH) Interventions SDOH Screenings   Food Insecurity: No Food Insecurity (02/05/2023)  Housing: Low Risk  (02/05/2023)  Transportation Needs: Unmet Transportation Needs (02/05/2023)  Utilities: Not At Risk (02/05/2023)  Alcohol Screen: Low Risk  (11/10/2020)  Depression (PHQ2-9): Low Risk  (10/16/2022)  Financial Resource Strain: Medium Risk (05/17/2022)  Physical Activity: Inactive (11/10/2020)  Tobacco Use: Medium Risk (02/13/2023)    Readmission Risk Interventions    07/01/2022    9:05 AM 11/28/2020   11:57 AM  Readmission Risk Prevention Plan  Transportation Screening Complete Complete  Medication Review ([place]) Complete Complete  PCP or Specialist appointment within 3-5 days of discharge Complete   HRI or [place] Complete Complete  SW Recovery Care/Counseling Consult Complete Complete  [place] Not Applicable Not  [place] Not Applicable Not Applicable

## 2023-02-14 NOTE — Progress Notes (Addendum)
[place] for  warfarin  Indication: LVAD  Allergies  Allergen Reactions   Vancomycin Nausea And Vomiting and Other (See Comments)    Severe joint pain    Patient Measurements: Height: '5\' 10"'$  (177.8 cm) Weight:  (patient refused/will stand later) IBW/kg (Calculated) : 73   Vital Signs: Temp: 97.7 F (36.5 C) (03/08 0741) Temp Source: Oral (03/08 0741) BP: 124/92 (03/08 0940) Pulse Rate: 80 (03/08 0741)  Labs: Recent Labs    02/12/23 0615 02/13/23 0017 02/13/23 1238 02/14/23 0842  HGB 11.9* 9.9*  --  11.8*  HCT 35.4* 29.0*  --  35.4*  PLT 324 240  --  278  LABPROT 26.5* 25.6*  --  19.4*  INR 2.5* 2.4*  --  1.7*  CREATININE 1.17 1.57* 1.46* 1.06     Estimated Creatinine Clearance: 74.6 mL/min (by C-G formula based on SCr of 1.06 mg/dL).   Medical History: Past Medical History:  Diagnosis Date   "    Arthritis    CAD (coronary artery disease)    a. s/p CABG in 11/2019 with LIMA-LAD, SVG-OM1, SVG-PDA and SVG-D1   CHF (congestive heart failure) (HCC)    a. EF < 20% by echo in 11/2019   Essential hypertension    PAF (paroxysmal atrial fibrillation) (HCC)    Type 2 diabetes mellitus (HCC)     Medications:   DAPTOmycin (CUBICIN) 700 mg in sodium chloride 0.9 % IVPB 700 mg (02/13/23 1930)    Assessment: 62 yo male on chronic Coumadin for LVAD, poor compliance with outpatient checks.  INR on admission 1.1. S/p OR 2/20 for drive line infection debridement.   Previous Coumadin dose 6 mg daily except 4 mg on Tues, Thurs.  INR down to 1.7 today.  Discussed with Dr. Daniel Nones, will hold off on adding low dose heparin drip for now, hopefully INR will trend back up tomorrow.  No overt bleeding or complications noted. Hgb stable 10-11 plt stable 240-300  LDH stable at 166. No s/sx of bleeding. Underwent I&D with wound VAC change 2/28 and 3/6.   Goal of Therapy:  INR 2-2.4 (keeping low in anticipation of return to OR  soon) Monitor platelets by anticoagulation protocol: Yes   Plan:  Warfarin 6 mg x 1 tonight  Daily PT/INR.   [place] Crane, Roylene Reason, BCCP Clinical Pharmacist  02/14/2023 10:08 AM   [facility] pharmacy phone numbers are listed on [email or web address]

## 2023-02-14 NOTE — Progress Notes (Signed)
Pt refused to get up for daily weight, states he doesn't feel good and he is tired.Reinforced importance of daily weight

## 2023-02-14 NOTE — Progress Notes (Addendum)
Patient ID: Kevin Millers., male   DOB: [birth year], 62 y.o.   MRN: [id number]   Advanced Heart Failure VAD Team Note  PCP-Cardiologist: Rozann Lesches, MD  AHF: Dr. Aundra Dubin   Subjective:   -Driveline site infection.  Wound Cx with MRSA. On IV vancomycin.  OR for debridement w/ wound vac placement 2/20 and 2/28. -Psych consulted and saw patient 3/3 for suicidal ideation/depression. Patient now on Remeron.  -Underwent I&D with wound vac removal 3/6  SCr 1.57>1.46. Off diuretics, improving with PO intake.    INR not collected this am?  K 4.3   Feels ok this morning. Apparently had a rough night last night. Otherwise yesterday he was able to ambulate and overall had a decent day. Denies CP, SOB and nausea.   LVAD INTERROGATION:  HeartMate III LVAD:   Flow 4 liters/min, speed 5600, power 4.3, PI 4. 39 PI events.   Objective:    Vital Signs:   Temp:  [97.6 F (36.4 C)-98.5 F (36.9 C)] 97.6 F (36.4 C) (03/08 0528) Pulse Rate:  [82-106] 92 (03/08 0528) Resp:  [14-20] 15 (03/08 0528) BP: (91-177)/(73-109) 105/84 (03/08 0528) SpO2:  [94 %-96 %] 96 % (03/08 0528) Last BM Date : 02/07/23 Mean arterial Pressure 90s-110s  Intake/Output:   Intake/Output Summary (Last 24 hours) at 02/14/2023 0714 Last data filed at 02/14/2023 0528 Gross per 24 hour  Intake 240 ml  Output 2550 ml  Net -2310 ml    Physical Exam  General:  Well appearing. No resp difficulty HEENT: Normal Neck: supple. JVP ~7. Carotids 2+ bilat; no bruits. No lymphadenopathy or thyromegaly appreciated. Cor: Mechanical heart sounds with LVAD hum present. Lungs: Clear Abdomen: soft, nontender, nondistended. No hepatosplenomegaly. No bruits or masses. Good bowel sounds. Driveline: C/D/I; securement device intact. Gauze/medipore tape over DL site.  Extremities: no cyanosis, clubbing, rash, edema Neuro: alert & orientedx3, cranial nerves grossly intact. moves all 4 extremities w/o difficulty. Affect pleasant    Telemetry   NSR 80s (Personally reviewed)    Labs   Basic Metabolic Panel: Recent Labs  Lab 02/10/23 0021 02/11/23 0605 02/12/23 0615 02/13/23 0017 02/13/23 1238  NA 135 134* 135 139 140  K 3.8 4.2 4.1 4.3 4.2  CL 103 102 100 107 105  CO2 '22 24 25 22 27  '$ GLUCOSE 120* 149* 152* 114* 89  BUN '14 19 18 '$ 26* 29*  CREATININE 1.15 1.42* 1.17 1.57* 1.46*  CALCIUM 9.1 9.0 9.0 8.8* 8.8*  MG 1.9  --  1.8  --   --    Liver Function Tests: No results for input(s): "AST", "ALT", "ALKPHOS", "BILITOT", "PROT", "ALBUMIN" in the last 168 hours.  CBC: Recent Labs  Lab 02/09/23 0034 02/10/23 0021 02/11/23 0605 02/12/23 0615 02/13/23 0017  WBC 7.4 7.5 9.3 10.8* 10.5  HGB 10.5* 11.8* 12.5* 11.9* 9.9*  HCT 30.2* 35.2* 36.8* 35.4* 29.0*  MCV 82.5 83.2 82.7 83.3 85.0  PLT 268 320 330 324 240   INR: Recent Labs  Lab 02/09/23 0034 02/10/23 0021 02/11/23 0605 02/12/23 0615 02/13/23 0017  INR 1.8* 1.9* 2.2* 2.5* 2.4*    Other results:  Imaging   DG CHEST PORT 1 VIEW  Result Date: 02/13/2023 CLINICAL DATA:  Dyspnea. EXAM: PORTABLE CHEST 1 VIEW COMPARISON:  01/30/2023. FINDINGS: 1014 hours. Unchanged LVAD and left atrial appendage clip. No focal airspace opacity or pulmonary edema. Stable cardiac and mediastinal contours. No pleural effusion or pneumothorax. IMPRESSION: No evidence of acute cardiopulmonary disease. Electronically Signed  By: Emmit Alexanders M.D.   On: 02/13/2023 10:20    Medications:     Scheduled Medications:  allopurinol  100 mg Oral Daily   amiodarone  200 mg Oral BID   feeding supplement (GLUCERNA SHAKE)  237 mL Oral TID WC   glipiZIDE  5 mg Oral Q breakfast   guaiFENesin  600 mg Oral BID   insulin aspart  0-24 Units Subcutaneous TID WC   insulin aspart  0-5 Units Subcutaneous QHS   loratadine  10 mg Oral Daily   metFORMIN  500 mg Oral BID WC   metoprolol succinate  25 mg Oral Daily   minocycline  200 mg Oral BID   mirtazapine  15 mg Oral QHS    multivitamin with minerals  1 tablet Oral Q24H   pantoprazole  40 mg Oral Daily   sertraline  50 mg Oral Daily   tamsulosin  0.4 mg Oral Daily   thiamine  100 mg Oral Daily   traZODone  150 mg Oral QHS   Warfarin - Pharmacist Dosing Inpatient   Does not apply q1600   zinc sulfate  220 mg Oral Daily    Infusions:  DAPTOmycin (CUBICIN) 700 mg in sodium chloride 0.9 % IVPB 700 mg (02/13/23 1930)    PRN Medications: acetaminophen, colchicine, fluticasone, hydrALAZINE, ipratropium, levalbuterol, ondansetron (ZOFRAN) IV, mouth rinse, prochlorperazine  Patient Profile  62 y.o. 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 type 2 diabetes mellitus, COPD, hypertension, and HMIII VAD.    Admitted with recurrent LVAD complication driveline infection + suspected AECOPD Assessment/Plan:   1. LVAD Complication---> Recurrent driveline infection: MRSA driveline infection, admitted in 6/23 and again in 7/23-8/23 with multiple trips to the OR for debridement. He had completed vancomycin and then long-term doxycycline. Most recent culture PTA grew acinetobacter and MRSA. ID started minocycline. He had been off minocycline for at least 3 days prior to this admit w/ copious drainage around driveline. CT chest/abd/pelvis showed stranding around the driveline and small amount of fluid. Started on Vanc and Meropenum per ID. Now off meropenem (2/21).  OR for debridement w/ wound vac placement 2/20. Wound culture with MRSA, blood cultures negative.  Afebrile.  - Will need 6 wks abx. Vanc switched to dapto 2/27 with nausea. Now on dapto/minocycline. Once dapto complete continue suppressive minocycline   - Currently AF. Site looks ok  - Returned to OR for I&D and wound vac removal 3/6.  - INR pending. Dosing per pharmD  2. Chronic HFrEF: Echo 10/21 with EF  20-25%, mildly decreased RV function. LHC/RHC in 12/21 with patent grafts, low output. Suspect mixed ischemic/nonischemic cardiomyopathy (prior heavy ETOH and drugs as well as CAD).  No ETOH, drugs, smoking since CABG in 12/20. Admitted with cardiogenic shock in 12/21, had placement of Impella 5.5 initially, now s/p Heartmate 3 LVAD on 11/17/20.  Ramp echo 2/23 with speed decreased to 5500 rpm.  - Volume status ok. Po intake has been poor, received 1L IVF 3/3 and again 3.5.  PO intake improving - Warfarin restarted, goal INR 1.8-2.2 pre-surgery. INR not collected today? Will f/u with RN. Now off low-dose heparin Discussed dosing with PharmD. - Not a candidate for transplant with ongoing drug use.  3. HTN: MAPs elevated low 100s on admit, poor compliance with meds. Has fluctuated significantly while admitted. MAPs elevated this am in  setting of emotional distress - Can continue Toprol XL 25 mg daily  - Entresto stopped due to lower Maps.   - BP more stable on hydralazine PRN - Has spikes of HTN with n/v. ? Drug withdrawal 4. Polysubstance Abuse: Recently started using methamphetamine again.  UDS + for amphetamines.  - SW team following. Unfortunately, limited resources for inpatient substance use due LVAD - Having paroxysms of n/v and HTN. suspect withdrawal from meth. - Will continue CIWA protocol for now  - Psych saw patient 3/3, now on remeron 5. PAF: S/p Maze and LA appendage clip with CABG in 12/20.  NSR this admission.  6. 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  Still some wheezing but has improved.   - Continue scheduled atrovent.  - Levalbuterol prn.  - Have avoided prednisone with surgery at driveline site for recurrent infection.   7. DMII - SSI  8.  NSVT/PVCs: Frequent NSVT 2/23, may have been dry after diuresis.  Amiodarone gtt started to suppress.  - Continue po amiodarone 200 mg bid.  - Would eventually stop this medication, avoid with COPD.   9. Nausea/vomiting - Began 2/27. Assumed 2/2  vancomycin initially however vanc switched to daptomycin and nausea continues.  - KUB ok - suspect etiology due to severe emotional distress/anxiety +/- substance abuse WD  10. Major depression - Patient refusing to work with PT, not motivated and expresses frustration with his overall situation.  Ongoing drug use, no longer living with his wife.  Has been living with his mother who cannot care for him.  - Now saying that he would like his VAD to be off so that his wife and mother don't have him in common any more  - Psych consulted and saw patient, now on remeron 11. AKI - SCr 1.15>1.42>1.17. Suspect 2/2 #9. Improved w/ IVFs -  SCr 1.57>1.46 today, PO intake improving  12. NSVT: No further NSVT. K stable at 4.2 - Mg 1.8, replete - cont amiodarone   [facility] Male [facility], awaiting potential bed opening . Would need SNF placement at discharge.   Addendum 8:03 AM  Message from RN, doppler in 72s. Patient arousable and A&O x4. Apparently got ativan and hydralazine overnight. Overall asymptomatic. Will do 210m NS bolus x1.   I reviewed the LVAD parameters from today, and compared the results to the patient's prior recorded data.  No programming changes were made.  The LVAD is functioning within specified parameters.  The patient performs LVAD self-test daily.  LVAD interrogation was negative for any significant power changes, alarms or PI events/speed drops.  LVAD equipment check completed and is in good working order.  Back-up equipment present.   LVAD education done on emergency procedures and precautions and reviewed exit site care.  Length of Stay: 1[place]ear Dance NP 02/14/2023, 7:14 AM  VAD Team --- VAD ISSUES ONLY--- Pager [phone](7am - 7am)  Advanced Heart Failure Team  Pager [phone](M-F; 7a - 5p)  Please contact [facility] for night-coverage after hours (5p -7a ) and weekends on [email or web address]

## 2023-02-14 NOTE — Progress Notes (Signed)
Paged by RN for VAD MAPs in 4s again. Responded well to IVF earlier today. Still with multiple PI events otherwise completely asymptomatic and feels "great".   Has not had much intake today as he has been asleep for most of the day. Only got up around 1500 to use urinal per patient report. VAD interrogated and parameters stable.   Will do 533m bolus. Encouraged to slightly increase PO intake as tolerated.   AForestine Na AGACNP-BC Advanced Heart Failure Team  02/14/23

## 2023-02-14 NOTE — Progress Notes (Signed)
LVAD Coordinator Rounding Note:  Admitted 01/27/23 to Heart Failure service from ED due to driveline infection. Started on IV antibiotics.   S/p driveline debridement in OR 2/21,2/28 and 3/7. Wound vac dressing clean, dry, intact. Scant serosanguinous drainage in canister. Plan to return to OR for washout today per Dr Prescott Gum. VAD Coordinator to accompany pt to OR.   Pt asleep on my arrival. Anxious and nauseated overnight. Received 1 mg of Ativan. Klonopin 0.'5mg'$  added BID today.  RN reported MAP of 60 this AM. 250cc bolus given. Repeat MAP of 92.  Psych consulted this past weekend for suicidal ideation/depression. Patient now on Remeron. Pysch documented that pt was not a threat to himself or others.    Pt will need home IV antibiotics at discharge. Pt will need placement in SNF for IV antibiotics at discharge as he is living with his mom and she is unable to help him with care/medication administration. VAD coordinator spoke with his wife last week and they are not currently getting along. It would be much safer for him to be in a SNF post discharge for IV antibiotics. Inpatient social work awaiting bed offers for placement. Pt has been denied admission to [facility]. Awaiting potential approval at [facility]. Search for placement ongoing.   Controller electively exchanged this admission. No further low voltage hazard alarms seen on interrogation.  Vital signs: Temp:  97.7 HR: 80 Doppler Pressure: 66 repeat: 92 Automatic BP: 71/53 (59) repeat: 124/92 (100) O2 Sat: 94% on RA Wt:192.9>197>195.9>190.9>187.6>188.9>184.1>189.6>182.3>181.8>182.3>183.4>191.6>184.5 lbs  LVAD interrogation reveals:  Speed: 5650 Flow: 3.9 Power: 4.2 w PI: 5.5 Alarms: none Events: >100 Hct: 35  Fixed speed:  5600 Low speed limit:  5300  Drive Line:  Existing VAD dressing removed and site care performed using sterile technique. Moderate amount of serosanguineous drainage noted in wound  bed. Drive line exit site cleaned with Chlora prep applicators x 2, allowed to dry. Rylon sheet intact.Vashe soaked 2x2 gauze placed on top of rylon sheet followed by dry gauze. No redness, foul odor, or tenderness noted. Drive line anchor replaced. Daily sterile wet to dry dressing changes per bedside RN or VAD coordinator. Next dressing change in tomorrow 02/15/23 by bedside nurse.   Labs:  LDH trend: [phone]  INR trend: 1.1>1.1>1.4>1.9>2.2>2.4>1.8>1.9>2.2>2.5>2.4>1.7  Anticoagulation Plan: -INR Goal: 2.0 - 2.5 -ASA Dose: none -Heparin 500 u/hr -off  ICD: N/A  Infection:  01/27/23>> blood cultures>> no growth 4 days 01/27/23>>driveline culture>>FEW METHICILLIN RESISTANT STAPHYLOCOCCUS AUREUS  01/28/23>>driveline cx from OR>>FEW METHICILLIN RESISTANT STAPHYLOCOCCUS AUREUS; final  Plan/Recommendations:  Page VAD coordinator with equipment issues or driveline problems Continue daily dressing changes by bedside nurse or VAD Coordinator.  Bobbye Morton RN,BSN [place] Coordinator  Office: [phone]  24/7 Pager: [phone]

## 2023-02-14 NOTE — TOC Progression Note (Addendum)
Transition of Care (TOC) - Progression Note    Patient Details  Name: Kevin Underwood. MRN: [id number] Date of Birth: Apr 17, 1961  Transition of Care [facility]) CM/SW Contact  Erenest Rasher, RN Phone Number: [phone] 02/14/2023, 1:22 PM  Clinical Narrative:    Spoke to [facility] and they do accept patient's with LVAD, and IV abx. Requested CM send clinicals and she will review.    CM contacted [facility] # [phone], their [employer] in [place] manages LVAD patients. Contacted her with new referral. Left message for return call.   Waiting on call back from [facility], left message for Debbie.    Contacted [employer] in [place], rep, [place] # [phone] to follow up on referral. States they would like training. Left message with LVAD coordinator to give her a call.    Expected Discharge Plan: [place] Barriers to Discharge: Continued Medical Work up  Expected Discharge Plan and Services   Discharge Planning Services: CM Consult Post Acute Care Choice: [place] Living arrangements for the past 2 months: [place]: RN           Social Determinants of Health (SDOH) Interventions SDOH Screenings   Food Insecurity: No Food Insecurity (02/05/2023)  Housing: Low Risk  (02/05/2023)  Transportation Needs: Unmet Transportation Needs (02/05/2023)  Utilities: Not At Risk (02/05/2023)  Alcohol Screen: Low Risk  (11/10/2020)  Depression (PHQ2-9): Low Risk  (10/16/2022)  Financial Resource Strain: Medium Risk (05/17/2022)  Physical Activity: Inactive (11/10/2020)  Tobacco Use: Medium Risk (02/13/2023)    Readmission Risk Interventions    07/01/2022    9:05 AM 11/28/2020   11:57 AM  Readmission Risk Prevention Plan  Transportation Screening Complete Complete  Medication Review ([place]) Complete Complete  PCP or Specialist appointment within 3-5 days of discharge  Complete   HRI or [place] Complete Complete  SW Recovery Care/Counseling Consult Complete Complete  [place] Not Applicable Not [place] Not Applicable Not Applicable

## 2023-02-15 DIAGNOSIS — T829XXD Unspecified complication of cardiac and vascular prosthetic device, implant and graft, subsequent encounter: Secondary | ICD-10-CM | POA: Diagnosis not present

## 2023-02-15 LAB — GLUCOSE, CAPILLARY
Glucose-Capillary: 158 mg/dL — ABNORMAL HIGH (ref 70–99)
Glucose-Capillary: 192 mg/dL — ABNORMAL HIGH (ref 70–99)
Glucose-Capillary: 129 mg/dL — ABNORMAL HIGH (ref 70–99)
Glucose-Capillary: 151 mg/dL — ABNORMAL HIGH (ref 70–99)

## 2023-02-15 LAB — CBC
HCT: 31.6 % — ABNORMAL LOW (ref 39.0–52.0)
Hemoglobin: 10.4 g/dL — ABNORMAL LOW (ref 13.0–17.0)
MCH: 28.1 pg (ref 26.0–34.0)
MCHC: 32.9 g/dL (ref 30.0–36.0)
MCV: 85.4 fL (ref 80.0–100.0)
Platelets: 246 10*3/uL (ref 150–400)
RBC: 3.7 MIL/uL — ABNORMAL LOW (ref 4.22–5.81)
RDW: 14.5 % (ref 11.5–15.5)
WBC: 9 10*3/uL (ref 4.0–10.5)
nRBC: 0 % (ref 0.0–0.2)

## 2023-02-15 LAB — BASIC METABOLIC PANEL
Anion gap: 7 (ref 5–15)
BUN: 23 mg/dL (ref 8–23)
CO2: 24 mmol/L (ref 22–32)
Calcium: 8.7 mg/dL — ABNORMAL LOW (ref 8.9–10.3)
Chloride: 106 mmol/L (ref 98–111)
Creatinine, Ser: 1.3 mg/dL — ABNORMAL HIGH (ref 0.61–1.24)
GFR, Estimated: >60 mL/min (ref >60)
Glucose, Bld: 133 mg/dL — ABNORMAL HIGH (ref 70–99)
Potassium: 4.1 mmol/L (ref 3.5–5.1)
Sodium: 137 mmol/L (ref 135–145)

## 2023-02-15 LAB — LACTATE DEHYDROGENASE: LDH: 137 U/L (ref 98–192)

## 2023-02-15 LAB — PROTIME-INR
INR: 1.8 — ABNORMAL HIGH (ref 0.8–1.2)
Prothrombin Time: 21 seconds — ABNORMAL HIGH (ref 11.4–15.2)

## 2023-02-15 MED ORDER — SODIUM CHLORIDE 0.9 % IV BOLUS
1000.0000 mL | Freq: Once | INTRAVENOUS | Status: AC
  Administered 2023-02-15: 1000 mL via INTRAVENOUS

## 2023-02-15 MED ORDER — LACTATED RINGERS IV BOLUS: 1000.0000 mL | Freq: Once | INTRAVENOUS | Status: DC

## 2023-02-15 MED ORDER — LOSARTAN POTASSIUM 50 MG PO TABS
50.0000 mg | ORAL_TABLET | Freq: Every day | ORAL | Status: DC
  Administered 2023-02-15: 50 mg via ORAL
  Filled 2023-02-15: qty 1

## 2023-02-15 MED ORDER — WARFARIN SODIUM 3 MG PO TABS
6.0000 mg | ORAL_TABLET | Freq: Once | ORAL | Status: AC
  Administered 2023-02-15: 6 mg via ORAL
  Filled 2023-02-15: qty 2

## 2023-02-15 NOTE — Progress Notes (Signed)
[place] for  warfarin  Indication: LVAD  Allergies  Allergen Reactions   Vancomycin Nausea And Vomiting and Other (See Comments)    Severe joint pain    Patient Measurements: Height: '5\' 10"'$  (177.8 cm) Weight: 87.4 kg (192 lb 10.9 oz) IBW/kg (Calculated) : 73   Vital Signs: Temp: 97.6 F (36.4 C) (03/09 0537) Temp Source: Oral (03/09 0537) BP: 109/76 (03/09 0537) Pulse Rate: 98 (03/09 0537)  Labs: Recent Labs    02/13/23 0017 02/13/23 1238 02/14/23 0842 02/15/23 0027  HGB 9.9*  --  11.8* 10.4*  HCT 29.0*  --  35.4* 31.6*  PLT 240  --  278 246  LABPROT 25.6*  --  19.4* 21.0*  INR 2.4*  --  1.7* 1.8*  CREATININE 1.57* 1.46* 1.06  --      Estimated Creatinine Clearance: 74.6 mL/min (by C-G formula based on SCr of 1.06 mg/dL).   Medical History: Past Medical History:  Diagnosis Date   "    Arthritis    CAD (coronary artery disease)    a. s/p CABG in 11/2019 with LIMA-LAD, SVG-OM1, SVG-PDA and SVG-D1   CHF (congestive heart failure) (HCC)    a. EF < 20% by echo in 11/2019   Essential hypertension    PAF (paroxysmal atrial fibrillation) (HCC)    Type 2 diabetes mellitus (HCC)     Medications:   DAPTOmycin (CUBICIN) 700 mg in sodium chloride 0.9 % IVPB 700 mg (02/14/23 2023)    Assessment: 62 yo male on chronic Coumadin for LVAD, poor compliance with outpatient checks.  INR on admission 1.1. S/p OR 2/20 for drive line infection debridement.   Previous Coumadin dose 6 mg daily except 4 mg on Tues, Thurs.  INR up to 1.8 today. Per HF team, will hold off on low dose heparin drip with INR trend.  No overt bleeding or complications noted. Hgb stable 10-11 plt stable 240-300  LDH stable at 137. No s/sx of bleeding. Underwent I&D with wound VAC change 2/28 and 3/6.   Goal of Therapy:  INR 2-2.4 (keeping low in anticipation of return to OR soon) Monitor platelets by anticoagulation protocol: Yes   Plan:  Warfarin 6 mg  x 1 tonight  Daily PT/INR.  Thank you for involving pharmacy in this patient's care.  Reatha Harps, PharmD PGY2 Pharmacy Resident 02/15/2023 7:30 AM

## 2023-02-15 NOTE — Plan of Care (Signed)
  Problem: Education: Goal: Patient will understand all VAD equipment and how it functions Outcome: Progressing Goal: Patient will be able to verbalize current INR target range and antiplatelet therapy for discharge home Outcome: Progressing   Problem: Cardiac: Goal: LVAD will function as expected and patient will experience no clinical alarms Outcome: Progressing   Problem: Health Behavior/Discharge Planning: Goal: Ability to manage health-related needs will improve Outcome: Progressing   Problem: Clinical Measurements: Goal: Ability to maintain clinical measurements within normal limits will improve Outcome: Progressing Goal: Will remain free from infection Outcome: Progressing Goal: Diagnostic test results will improve Outcome: Progressing Goal: Respiratory complications will improve Outcome: Progressing Goal: Cardiovascular complication will be avoided Outcome: Progressing   Problem: Activity: Goal: Risk for activity intolerance will decrease Outcome: Progressing   Problem: Nutrition: Goal: Adequate nutrition will be maintained Outcome: Progressing   Problem: Coping: Goal: Level of anxiety will decrease Outcome: Progressing   Problem: Elimination: Goal: Will not experience complications related to bowel motility Outcome: Progressing Goal: Will not experience complications related to urinary retention Outcome: Progressing   Problem: Pain Managment: Goal: General experience of comfort will improve Outcome: Progressing   Problem: Safety: Goal: Ability to remain free from injury will improve Outcome: Progressing   Problem: Skin Integrity: Goal: Risk for impaired skin integrity will decrease Outcome: Progressing

## 2023-02-15 NOTE — Progress Notes (Signed)
MD notified of doppler 180 and low flow alarm this am.  PRN Hydralazine and scheduled medications given and doppler decreased to 130. No further alarms noted.

## 2023-02-15 NOTE — Plan of Care (Signed)
  Problem: Education: Goal: Patient will understand all VAD equipment and how it functions Outcome: Progressing Goal: Patient will be able to verbalize current INR target range and antiplatelet therapy for discharge home Outcome: Progressing   Problem: Cardiac: Goal: LVAD will function as expected and patient will experience no clinical alarms Outcome: Progressing   Problem: Education: Goal: Knowledge of General Education information will improve Description: Including pain rating scale, medication(s)/side effects and non-pharmacologic comfort measures Outcome: Progressing   Problem: Health Behavior/Discharge Planning: Goal: Ability to manage health-related needs will improve Outcome: Progressing   Problem: Clinical Measurements: Goal: Ability to maintain clinical measurements within normal limits will improve Outcome: Progressing Goal: Will remain free from infection Outcome: Progressing Goal: Diagnostic test results will improve Outcome: Progressing Goal: Respiratory complications will improve Outcome: Progressing Goal: Cardiovascular complication will be avoided Outcome: Progressing   Problem: Activity: Goal: Risk for activity intolerance will decrease Outcome: Progressing   Problem: Coping: Goal: Level of anxiety will decrease Outcome: Progressing   Problem: Elimination: Goal: Will not experience complications related to bowel motility Outcome: Progressing Goal: Will not experience complications related to urinary retention Outcome: Progressing

## 2023-02-15 NOTE — Progress Notes (Signed)
VAD Coordinator paged in regards to patient having hypotension. Automatic cuff 49/ 28 (34) Doppler MAP 50. RN reports 3 Low Flow alarms today. Current VAD parameters Flow: 2.8 Speed: 5600 Power:4 w PI: 10.5. Discussed with Dr. Daniel Nones. Plan to give 1L LR bolus. Orders relayed to [facility].  Bobbye Morton RN, BSN VAD Coordinator 24/7 Pager [phone]

## 2023-02-15 NOTE — TOC Progression Note (Signed)
Transition of Care (TOC) - Progression Note    Patient Details  Name: Kevin Underwood. MRN: [id number] Date of Birth: 1961-08-29  Transition of Care [facility]) CM/SW Contact  Erenest Rasher, RN Phone Number: [phone] 02/15/2023, 9:02 AM  Clinical Narrative:    Received notification from [facility] SNF that are not able to extend a bed offer.   Waiting on call back from [facility] in [place]. Spoke to [employer], [employer].    Expected Discharge Plan: [place] Barriers to Discharge: Continued Medical Work up  Expected Discharge Plan and Services   Discharge Planning Services: CM Consult Post Acute Care Choice: [place] Living arrangements for the past 2 months: [place]: RN           Social Determinants of Health (SDOH) Interventions SDOH Screenings   Food Insecurity: No Food Insecurity (02/05/2023)  Housing: Low Risk  (02/05/2023)  Transportation Needs: Unmet Transportation Needs (02/05/2023)  Utilities: Not At Risk (02/05/2023)  Alcohol Screen: Low Risk  (11/10/2020)  Depression (PHQ2-9): Low Risk  (10/16/2022)  Financial Resource Strain: Medium Risk (05/17/2022)  Physical Activity: Inactive (11/10/2020)  Tobacco Use: Medium Risk (02/13/2023)    Readmission Risk Interventions    07/01/2022    9:05 AM 11/28/2020   11:57 AM  Readmission Risk Prevention Plan  Transportation Screening Complete Complete  Medication Review ([place]) Complete Complete  PCP or Specialist appointment within 3-5 days of discharge Complete   HRI or [place] Complete Complete  SW Recovery Care/Counseling Consult Complete Complete  [place] Not Applicable Not [place] Not Applicable Not Applicable

## 2023-02-15 NOTE — Progress Notes (Signed)
B/p 49/28 (34) after multiple attempts and sites, doppler 50.  LVAD numbers as below.  [place] LVAD coordinator paged and made aware of findings.  Waiting return call.          02/15/23 2319  HeartMate 3 VAD Equipment Check  Pump Speed (RPM) 5600 RPM  Pump Flow (LPM) 2.8  Power (Watts) 4 Watts  Pulsatility Index 10.5  Fixed Speed Limit 5600 rpm  Low Speed Limit 5300 rpm  Alarms (S)  Other (Comment) (low flow alarm x2)  Auscultated Normal expected humming  Patient Battery Source [facility] / Wall unit  Emergency Equipment at Bedside Yes  Patient Hemodynamics  BP (!) 49/28  MAP (mmHg) (!) 34  Doppler Pressure 50  BP Method Manual  O2 Sat Reading Accurately Yes - Heart rate tracking accurately  Capillary Refill Less than 3 seconds  Color Appropriate for ethnicity  Temperature/Moisture Warm;Dry

## 2023-02-15 NOTE — Progress Notes (Addendum)
Patient ID: Kevin Millers., male   DOB: 1961/07/09, 62 y.o.   MRN: [id number]   Advanced Heart Failure VAD Team Note  PCP-Cardiologist: Rozann Lesches, MD  AHF: Dr. Aundra Dubin   Subjective:   -Driveline site infection.  Wound Cx with MRSA. On IV vancomycin.  OR for debridement w/ wound vac placement 2/20 and 2/28. -Psych consulted and saw patient 3/3 for suicidal ideation/depression. Patient now on Remeron.  -Underwent I&D with wound vac removal 3/6  Got IVF yesterday for low po intake. Feeling better. Nausea under better control with klonopin. Remains on IV abx. Wound vac out. Afebrile.   MAPs very labile based on emotional stress level MAP was 180 this am and had low flow but now improved with MAPs ~100  LVAD INTERROGATION:  HeartMate III LVAD:   Flow 4.6 liters/min, speed 5600, power 4.0, PI 2.3  Objective:    Vital Signs:   Temp:  [97.6 F (36.4 C)-98.4 F (36.9 C)] 97.7 F (36.5 C) (03/09 0801) Pulse Rate:  [71-98] 98 (03/09 0537) Resp:  [15-22] 22 (03/09 0801) BP: (70-177)/(50-110) 177/107 (03/09 1026) SpO2:  [92 %-97 %] 97 % (03/09 0801) Weight:  [83.7 kg-87.4 kg] 87.4 kg (03/09 0537) Last BM Date : 02/07/23 Mean arterial Pressure 100-180  Intake/Output:   Intake/Output Summary (Last 24 hours) at 02/15/2023 1123 Last data filed at 02/14/2023 2023 Gross per 24 hour  Intake 82.16 ml  Output 1000 ml  Net -917.84 ml     Physical Exam   General:  NAD.  HEENT: normal  Neck: supple. JVP not elevated.  Carotids 2+ bilat; no bruits. No lymphadenopathy or thryomegaly appreciated. Cor: LVAD hum.  Lungs: Clear. Abdomen: obese soft, nontender, non-distended. No hepatosplenomegaly. No bruits or masses. Good bowel sounds. Driveline site clean. Anchor in place.  Extremities: no cyanosis, clubbing, rash. Warm no edema  Neuro: alert & oriented x 3. No focal deficits. Moves all 4 without problem   Telemetry   NSR 80-90s (Personally reviewed)    Labs   Basic Metabolic  Panel: Recent Labs  Lab 02/10/23 0021 02/11/23 [id number] 02/12/23 0615 02/13/23 0017 02/13/23 1238 02/14/23 0842 02/15/23 0027  NA 135   < > 135 139 140 136 137  K 3.8   < > 4.1 4.3 4.2 3.8 4.1  CL 103   < > 100 107 105 99 106  CO2 22   < > '25 22 27 25 24  '$ GLUCOSE 120*   < > 152* 114* 89 132* 133*  BUN 14   < > 18 26* 29* 18 23  CREATININE 1.15   < > 1.17 1.57* 1.46* 1.06 1.30*  CALCIUM 9.1   < > 9.0 8.8* 8.8* 9.1 8.7*  MG 1.9  --  1.8  --   --   --   --    < > = values in this interval not displayed.    Liver Function Tests: No results for input(s): "AST", "ALT", "ALKPHOS", "BILITOT", "PROT", "ALBUMIN" in the last 168 hours.  CBC: Recent Labs  Lab 02/11/23 0605 02/12/23 0615 02/13/23 0017 02/14/23 0842 02/15/23 0027  WBC 9.3 10.8* 10.5 9.5 9.0  HGB 12.5* 11.9* 9.9* 11.8* 10.4*  HCT 36.8* 35.4* 29.0* 35.4* 31.6*  MCV 82.7 83.3 85.0 83.7 85.4  PLT 330 324 240 278 246    INR: Recent Labs  Lab 02/11/23 0605 02/12/23 0615 02/13/23 0017 02/14/23 0842 02/15/23 0027  INR 2.2* 2.5* 2.4* 1.7* 1.8*     Other results:  Imaging   No results found.  Medications:     Scheduled Medications:  allopurinol  100 mg Oral Daily   amiodarone  200 mg Oral BID   clonazePAM  0.5 mg Oral BID   feeding supplement (GLUCERNA SHAKE)  237 mL Oral TID WC   glipiZIDE  5 mg Oral Q breakfast   guaiFENesin  600 mg Oral BID   insulin aspart  0-24 Units Subcutaneous TID WC   insulin aspart  0-5 Units Subcutaneous QHS   loratadine  10 mg Oral Daily   metFORMIN  500 mg Oral BID WC   metoprolol succinate  25 mg Oral Daily   minocycline  200 mg Oral BID   mirtazapine  15 mg Oral QHS   multivitamin with minerals  1 tablet Oral Q24H   pantoprazole  40 mg Oral Daily   sertraline  50 mg Oral Daily   tamsulosin  0.4 mg Oral Daily   thiamine  100 mg Oral Daily   traZODone  150 mg Oral QHS   warfarin  6 mg Oral ONCE-1600   Warfarin - Pharmacist Dosing Inpatient   Does not apply q1600    zinc sulfate  220 mg Oral Daily    Infusions:  DAPTOmycin (CUBICIN) 700 mg in sodium chloride 0.9 % IVPB 700 mg (02/14/23 2023)    PRN Medications: acetaminophen, colchicine, fluticasone, hydrALAZINE, ipratropium, levalbuterol, ondansetron (ZOFRAN) IV, mouth rinse, prochlorperazine  Patient Profile  62 y.o. 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 type 2 diabetes mellitus, COPD, hypertension, and HMIII VAD.    Admitted with recurrent LVAD complication driveline infection + suspected AECOPD Assessment/Plan:   1. LVAD Complication---> Recurrent driveline infection: MRSA driveline infection, admitted in 6/23 and again in 7/23-8/23 with multiple trips to the OR for debridement. He had completed vancomycin and then long-term doxycycline. Most recent culture PTA grew acinetobacter and MRSA. ID started minocycline. He had been off minocycline for at least 3 days prior to this admit w/ copious drainage around driveline. CT chest/abd/pelvis showed stranding around the driveline and small amount of fluid. Started on Vanc and Meropenum per ID. Now off meropenem (2/21).  OR for debridement w/ wound vac placement 2/20. Wound culture with MRSA, blood cultures negative.  Afebrile.  - Will need 6 wks abx. Vanc switched to dapto 2/27 with nausea. Now on dapto/minocycline. Once dapto complete continue suppressive minocycline   - Remains AF on IV abx - Returned to OR for I&D and wound vac removal 3/6. Site looks ok today  2. Chronic HFrEF: Echo 10/21 with EF 20-25%, mildly decreased RV function. LHC/RHC in 12/21 with patent grafts, low output. Suspect mixed ischemic/nonischemic cardiomyopathy (prior heavy ETOH and drugs as well as CAD).  No ETOH, drugs, smoking since CABG in 12/20. Admitted with cardiogenic shock in 12/21, had placement of Impella  5.5 initially, now s/p Heartmate 3 LVAD on 11/17/20.   - Volume status improved after IVF yesterday, Avoid diuretics  - INR 1.8. Discussed dosing with PharmD personally. - Not a candidate for transplant with ongoing drug use.  3. HTN: MAPs elevated low 100s on admit, poor compliance with meds. Has fluctuated significantly while admitted. MAPs will elelvate in setting of emotional distress - Can continue Toprol XL 25 mg daily  - Entresto stopped due to lower Maps.  Will add losartan 50 - BP more stable  on hydralazine PRN 4. Polysubstance Abuse: Recently started using methamphetamine again.  UDS + for amphetamines.  - SW team following. Unfortunately, limited resources for inpatient substance use due LVAD - Having paroxysms of n/v and HTN. Suspect due to anxiety.  - Psych saw patient 3/3, now on remeron and klonopin 5. PAF: S/p Maze and LA appendage clip with CABG in 12/20.  NSR this admission.  6. 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  Still some wheezing but has improved.   - Continue scheduled atrovent.  - Levalbuterol prn.  - Have avoided prednisone with surgery at driveline site for recurrent infection.   7. DMII - SSI  8.  NSVT/PVCs: Frequent NSVT 2/23, may have been dry after diuresis.  Amiodarone gtt started to suppress.  - Continue po amiodarone 200 mg bid.  - Would eventually stop this medication, avoid with COPD.   9. Nausea/vomiting - Began 2/27. Assumed 2/2 vancomycin initially however vanc switched to daptomycin and nausea continues.  - KUB ok - suspect etiology due to severe emotional distress/anxiety +/- substance abuse WD  10. Major depression - Patient refusing to work with PT, not motivated and expresses frustration with his overall situation.  Ongoing drug use, no longer living with his wife.  Has been living with his mother who cannot care for him.  - Now saying that he would like his VAD to be off so that his wife and mother don't have him in common any more  - Psych consulted  and saw patient, now on remeron 11. AKI - SCr 1.15>1.42>1.17. Suspect 2/2 #9. Improved w/ IVFs -  SCr 1.57>1.46 > 1.30 today, PO intake improving  12. NSVT: No further NSVT.  - Keep K > 4.0 Mg > 2.0 13. Dispo - pending SNF placement  I reviewed the LVAD parameters from today, and compared the results to the patient's prior recorded data.  No programming changes were made.  The LVAD is functioning within specified parameters.  The patient performs LVAD self-test daily.  LVAD interrogation was negative for any significant power changes, alarms or PI events/speed drops.  LVAD equipment check completed and is in good working order.  Back-up equipment present.   LVAD education done on emergency procedures and precautions and reviewed exit site care.  Length of Stay: [place], MD 02/15/2023, 11:23 AM  VAD Team --- VAD ISSUES ONLY--- Pager [phone] (7am - 7am)  Advanced Heart Failure Team  Pager [phone] (M-F; 7a - 5p)  Please contact [facility] for night-coverage after hours (5p -7a ) and weekends on [email or web address]

## 2023-02-16 DIAGNOSIS — T829XXD Unspecified complication of cardiac and vascular prosthetic device, implant and graft, subsequent encounter: Secondary | ICD-10-CM | POA: Diagnosis not present

## 2023-02-16 LAB — BASIC METABOLIC PANEL
Anion gap: 12 (ref 5–15)
BUN: 21 mg/dL (ref 8–23)
CO2: 22 mmol/L (ref 22–32)
Calcium: 8.7 mg/dL — ABNORMAL LOW (ref 8.9–10.3)
Chloride: 101 mmol/L (ref 98–111)
Creatinine, Ser: 1.76 mg/dL — ABNORMAL HIGH (ref 0.61–1.24)
GFR, Estimated: 43 mL/min — ABNORMAL LOW (ref >60)
Glucose, Bld: 167 mg/dL — ABNORMAL HIGH (ref 70–99)
Potassium: 3.8 mmol/L (ref 3.5–5.1)
Sodium: 135 mmol/L (ref 135–145)

## 2023-02-16 LAB — PROTIME-INR
INR: 2.4 — ABNORMAL HIGH (ref 0.8–1.2)
Prothrombin Time: 25.7 seconds — ABNORMAL HIGH (ref 11.4–15.2)

## 2023-02-16 LAB — CBC
HCT: 30.4 % — ABNORMAL LOW (ref 39.0–52.0)
Hemoglobin: 10.3 g/dL — ABNORMAL LOW (ref 13.0–17.0)
MCH: 28.8 pg (ref 26.0–34.0)
MCHC: 33.9 g/dL (ref 30.0–36.0)
MCV: 84.9 fL (ref 80.0–100.0)
Platelets: 225 10*3/uL (ref 150–400)
RBC: 3.58 MIL/uL — ABNORMAL LOW (ref 4.22–5.81)
RDW: 14.6 % (ref 11.5–15.5)
WBC: 8.6 10*3/uL (ref 4.0–10.5)
nRBC: 0 % (ref 0.0–0.2)

## 2023-02-16 LAB — GLUCOSE, CAPILLARY
Glucose-Capillary: 135 mg/dL — ABNORMAL HIGH (ref 70–99)
Glucose-Capillary: 121 mg/dL — ABNORMAL HIGH (ref 70–99)
Glucose-Capillary: 111 mg/dL — ABNORMAL HIGH (ref 70–99)
Glucose-Capillary: 133 mg/dL — ABNORMAL HIGH (ref 70–99)

## 2023-02-16 LAB — LACTATE DEHYDROGENASE: LDH: 133 U/L (ref 98–192)

## 2023-02-16 MED ORDER — POTASSIUM CHLORIDE CRYS ER 20 MEQ PO TBCR
20.0000 meq | EXTENDED_RELEASE_TABLET | Freq: Once | ORAL | Status: AC
  Administered 2023-02-16: 20 meq via ORAL
  Filled 2023-02-16: qty 1

## 2023-02-16 MED ORDER — CLONAZEPAM 0.25 MG PO TBDP
0.2500 mg | ORAL_TABLET | Freq: Two times a day (BID) | ORAL | Status: DC
  Administered 2023-02-16 – 2023-02-20 (×9): 0.25 mg via ORAL
  Filled 2023-02-16 (×9): qty 1

## 2023-02-16 MED ORDER — LOSARTAN POTASSIUM 25 MG PO TABS
25.0000 mg | ORAL_TABLET | Freq: Every day | ORAL | Status: DC
  Administered 2023-02-16: 25 mg via ORAL
  Filled 2023-02-16: qty 1

## 2023-02-16 MED ORDER — WARFARIN SODIUM 2.5 MG PO TABS
2.5000 mg | ORAL_TABLET | Freq: Once | ORAL | Status: AC
  Administered 2023-02-16: 2.5 mg via ORAL
  Filled 2023-02-16: qty 1

## 2023-02-16 MED ORDER — SODIUM CHLORIDE 0.9 % IV BOLUS
500.0000 mL | Freq: Once | INTRAVENOUS | Status: AC
  Administered 2023-02-16: 500 mL via INTRAVENOUS

## 2023-02-16 MED ORDER — METFORMIN HCL 500 MG PO TABS
500.0000 mg | ORAL_TABLET | Freq: Every day | ORAL | Status: DC
  Administered 2023-02-17 – 2023-02-20 (×4): 500 mg via ORAL
  Filled 2023-02-16 (×4): qty 1

## 2023-02-16 NOTE — Progress Notes (Signed)
BP 73/61- doppler pressure 56- paged Olivia VAD, RN- wait 15 mins and recheck- BP 74/51- orders placed to do 500 ml bolus of 0.9% sodium chloride

## 2023-02-16 NOTE — Progress Notes (Addendum)
ANTICOAGULATION CONSULT NOTE  Pharmacy Consult for  warfarin  Indication: LVAD  Allergies  Allergen Reactions   Vancomycin Nausea And Vomiting and Other (See Comments)    Severe joint pain    Patient Measurements: Height: '5\' 10"'$  (177.8 cm) Weight: 84.8 kg (186 lb 15.2 oz) IBW/kg (Calculated) : 73   Vital Signs: Temp: 98.6 F (37 C) (03/09 1958) Temp Source: Oral (03/09 1958) BP: 92/65 (03/10 0509) Pulse Rate: 76 (03/10 0509)  Labs: Recent Labs    02/14/23 0842 02/15/23 0027 02/16/23 0617  HGB 11.8* 10.4* 10.3*  HCT 35.4* 31.6* 30.4*  PLT 278 246 225  LABPROT 19.4* 21.0* 25.7*  INR 1.7* 1.8* 2.4*  CREATININE 1.06 1.30* 1.76*     Estimated Creatinine Clearance: 44.9 mL/min (A) (by C-G formula based on SCr of 1.76 mg/dL (H)).   Medical History: Past Medical History:  Diagnosis Date   "    Arthritis    CAD (coronary artery disease)    a. s/p CABG in 11/2019 with LIMA-LAD, SVG-OM1, SVG-PDA and SVG-D1   CHF (congestive heart failure) (HCC)    a. EF < 20% by echo in 11/2019   Essential hypertension    PAF (paroxysmal atrial fibrillation) (HCC)    Type 2 diabetes mellitus (HCC)     Medications:   DAPTOmycin (CUBICIN) 700 mg in sodium chloride 0.9 % IVPB 700 mg (02/15/23 2019)    Assessment: 62 yo male on chronic Coumadin for LVAD, poor compliance with outpatient checks.  INR on admission 1.1. S/p OR 2/20 for drive line infection debridement.   Previous Coumadin dose 6 mg daily except 4 mg on Tues, Thurs.  INR increased significantly from 1.8 to 2.4 today. Will decrease today's dose given sharp rise.  No overt bleeding or complications noted. Hgb stable 10-11 plt stable 200-300  LDH stable at 130s. No s/sx of bleeding. Underwent I&D with wound VAC change 2/28 and 3/6.   Goal of Therapy:  INR 2-2.4 (keeping low in anticipation of return to OR soon) Monitor platelets by anticoagulation protocol: Yes   Plan:  Warfarin 2.5 mg x 1 tonight  Daily  PT/INR.  Thank you for involving pharmacy in this patient's care.  Reatha Harps, PharmD PGY2 Pharmacy Resident 02/16/2023 7:30 AM

## 2023-02-16 NOTE — Progress Notes (Signed)
Patient ID: Tawni Millers., male   DOB: 25-Oct-1961, 62 y.o.   MRN: [id number]   Advanced Heart Failure VAD Team Note  PCP-Cardiologist: Rozann Lesches, MD  AHF: Dr. Aundra Dubin   Subjective:   -Driveline site infection.  Wound Cx with MRSA. On IV vancomycin.  OR for debridement w/ wound vac placement 2/20 and 2/28. -Psych consulted and saw patient 3/3 for suicidal ideation/depression. Patient now on Remeron.  -Underwent I&D with wound vac removal 3/6  - Overnight MAPs dropped; continues to have poor PO intake. Gave one liter IVF - Mr. Barada reports feeling better after receiving IVFs. He continues to feel very anxious and depressed but no suicidal ideation.   LVAD INTERROGATION:  HeartMate III LVAD:   Flow 3.9 liters/min, speed 5600, power 4.3, PI 6.9 9 PI events so far today; no speed drops.   Objective:    Vital Signs:   Temp:  [97.6 F (36.4 C)-98.6 F (37 C)] 97.6 F (36.4 C) (03/10 0826) Pulse Rate:  [76-173] 76 (03/10 0509) Resp:  [14-17] 15 (03/10 0826) BP: (48-156)/(28-108) 156/108 (03/10 0826) SpO2:  [91 %-96 %] 96 % (03/10 0826) Weight:  [84.8 kg] 84.8 kg (03/10 0509) Last BM Date : 02/07/23 Mean arterial Pressure 100-180  Intake/Output:   Intake/Output Summary (Last 24 hours) at 02/16/2023 1118 Last data filed at 02/16/2023 0700 Gross per 24 hour  Intake 240 ml  Output --  Net 240 ml     Physical Exam   General:  NAD.  HEENT: normal  Neck: supple. JVP not visible..  Carotids 2+ bilat; no bruits. No lymphadenopathy or thryomegaly appreciated. Cor: LVAD hum.  Lungs: Clear. Abdomen: obese soft, nontender, non-distended. No hepatosplenomegaly. No bruits or masses. Good bowel sounds. Driveline site clean. Anchor in place.  Extremities: no cyanosis, clubbing, rash. Warm no edema  Neuro: alert & oriented x 3. No focal deficits. Moves all 4 without problem   Telemetry   NSR 80-90s (Personally reviewed)    Labs   Basic Metabolic Panel: Recent Labs   Lab 02/10/23 0021 02/11/23 [id number] 02/12/23 0615 02/13/23 0017 02/13/23 1238 02/14/23 0842 02/15/23 0027 02/16/23 0617  NA 135   < > 135 139 140 136 137 135  K 3.8   < > 4.1 4.3 4.2 3.8 4.1 3.8  CL 103   < > 100 107 105 99 106 101  CO2 22   < > '25 22 27 25 24 22  '$ GLUCOSE 120*   < > 152* 114* 89 132* 133* 167*  BUN 14   < > 18 26* 29* '18 23 21  '$ CREATININE 1.15   < > 1.17 1.57* 1.46* 1.06 1.30* 1.76*  CALCIUM 9.1   < > 9.0 8.8* 8.8* 9.1 8.7* 8.7*  MG 1.9  --  1.8  --   --   --   --   --    < > = values in this interval not displayed.    Liver Function Tests: No results for input(s): "AST", "ALT", "ALKPHOS", "BILITOT", "PROT", "ALBUMIN" in the last 168 hours.  CBC: Recent Labs  Lab 02/12/23 0615 02/13/23 0017 02/14/23 0842 02/15/23 0027 02/16/23 0617  WBC 10.8* 10.5 9.5 9.0 8.6  HGB 11.9* 9.9* 11.8* 10.4* 10.3*  HCT 35.4* 29.0* 35.4* 31.6* 30.4*  MCV 83.3 85.0 83.7 85.4 84.9  PLT 324 240 278 246 225    INR: Recent Labs  Lab 02/12/23 0615 02/13/23 0017 02/14/23 0842 02/15/23 0027 02/16/23 0617  INR 2.5* 2.4* 1.7*  1.8* 2.4*     Other results:  Imaging   No results found.  Medications:     Scheduled Medications:  allopurinol  100 mg Oral Daily   amiodarone  200 mg Oral BID   clonazepam  0.25 mg Oral BID   feeding supplement (GLUCERNA SHAKE)  237 mL Oral TID WC   glipiZIDE  5 mg Oral Q breakfast   guaiFENesin  600 mg Oral BID   insulin aspart  0-24 Units Subcutaneous TID WC   insulin aspart  0-5 Units Subcutaneous QHS   loratadine  10 mg Oral Daily   [START ON 02/17/2023] losartan  25 mg Oral Daily   metFORMIN  500 mg Oral BID WC   metoprolol succinate  25 mg Oral Daily   minocycline  200 mg Oral BID   mirtazapine  15 mg Oral QHS   multivitamin with minerals  1 tablet Oral Q24H   pantoprazole  40 mg Oral Daily   sertraline  50 mg Oral Daily   tamsulosin  0.4 mg Oral Daily   thiamine  100 mg Oral Daily   traZODone  150 mg Oral QHS   warfarin  2.5  mg Oral ONCE-1600   Warfarin - Pharmacist Dosing Inpatient   Does not apply q1600   zinc sulfate  220 mg Oral Daily    Infusions:  DAPTOmycin (CUBICIN) 700 mg in sodium chloride 0.9 % IVPB 700 mg (02/15/23 2019)    PRN Medications: acetaminophen, colchicine, fluticasone, hydrALAZINE, ipratropium, levalbuterol, ondansetron (ZOFRAN) IV, mouth rinse, prochlorperazine  Patient Profile  62 y.o. 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 type 2 diabetes mellitus, COPD, hypertension, and HMIII VAD.    Admitted with recurrent LVAD complication driveline infection + suspected AECOPD Assessment/Plan:   1. LVAD Complication---> Recurrent driveline infection: MRSA driveline infection, admitted in 6/23 and again in 7/23-8/23 with multiple trips to the OR for debridement. He had completed vancomycin and then long-term doxycycline. Most recent culture PTA grew acinetobacter and MRSA. ID started minocycline. He had been off minocycline for at least 3 days prior to this admit w/ copious drainage around driveline. CT chest/abd/pelvis showed stranding around the driveline and small amount of fluid. Started on Vanc and Meropenum per ID. Now off meropenem (2/21).  OR for debridement w/ wound vac placement 2/20. Wound culture with MRSA, blood cultures negative.  Afebrile.  - Will need 6 wks abx. Vanc switched to dapto 2/27 with nausea. Now on dapto/minocycline. Once dapto complete continue suppressive minocycline   - Remains AF on IV abx - Returned to OR for I&D and wound vac removal 3/6. Site looks ok today  2. Chronic HFrEF: Echo 10/21 with EF 20-25%, mildly decreased RV function. LHC/RHC in 12/21 with patent grafts, low output. Suspect mixed ischemic/nonischemic cardiomyopathy (prior heavy ETOH and drugs as well as CAD).  No ETOH, drugs, smoking  since CABG in 12/20. Admitted with cardiogenic shock in 12/21, had placement of Impella 5.5 initially, now s/p Heartmate 3 LVAD on 11/17/20.   - Volume status improved after IVF yesterday, Avoid diuretics  - INR 1.4. Discussed dosing with PharmD personally. Adjusting warfarin dose.  - Not a candidate for transplant with ongoing drug use.  3. HTN: MAPs elevated low 100s on admit, poor compliance with meds. Has fluctuated significantly while admitted. MAPs will elelvate in setting of emotional distress -  Can continue Toprol XL 25 mg daily  - Hypotensive overnight likely due to hypovolemia. Decreasing losartan to '25mg'$  daily. 1L IVF.  4. Polysubstance Abuse: Recently started using methamphetamine again.  UDS + for amphetamines.  - SW team following. Unfortunately, limited resources for inpatient substance use due LVAD - Having paroxysms of n/v. He believes it is all due to anxiety.  - Psych saw patient 3/3, now on remeron and klonopin 5. PAF: S/p Maze and LA appendage clip with CABG in 12/20.  NSR this admission.  6. 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  Still some wheezing but has improved.   - Continue scheduled atrovent.  - Levalbuterol prn.  - Have avoided prednisone with surgery at driveline site for recurrent infection.   7. DMII - SSI  8.  NSVT/PVCs: Frequent NSVT 2/23, may have been dry after diuresis.  Amiodarone gtt started to suppress.  - Continue po amiodarone 200 mg bid.  - Would eventually stop this medication, avoid with COPD.   9. Nausea/vomiting - Began 2/27. Assumed 2/2 vancomycin initially however vanc switched to daptomycin and nausea continues.  - KUB ok - suspect etiology due to severe emotional distress/anxiety +/- substance abuse WD  10. Major depression - Patient refusing to work with PT, not motivated and expresses frustration with his overall situation.  Ongoing drug use, no longer living with his wife.  Has been living with his mother who cannot care for him.  - Now saying  that he would like his VAD to be off so that his wife and mother don't have him in common any more  - Psych consulted and saw patient, now on remeron 11. AKI - sCr 1.76 from 1.3 yesterday; likely due to hypovolemia and hypotension. Decreased losartan dose to '25mg'$ ; 1L IVF. PI events have improved.  12. NSVT: No further NSVT.  - Keep K > 4.0 Mg > 2.0 13. Dispo - pending SNF placement  I reviewed the LVAD parameters from today, and compared the results to the patient's prior recorded data.  No programming changes were made.  The LVAD is functioning within specified parameters.  The patient performs LVAD self-test daily.  LVAD interrogation was negative for any significant power changes, alarms or PI events/speed drops.  LVAD equipment check completed and is in good working order.  Back-up equipment present.   LVAD education done on emergency procedures and precautions and reviewed exit site care.  Length of Stay: [address] DO 02/16/2023, 11:18 AM  VAD Team --- VAD ISSUES ONLY--- Pager [phone] (7am - 7am)  Advanced Heart Failure Team  Pager [phone] (M-F; 7a - 5p)  Please contact [facility] for night-coverage after hours (5p -7a ) and weekends on [email or web address]

## 2023-02-16 NOTE — Progress Notes (Deleted)
Pt refusing cardiac monitoring and dressing change.  Encouraged telemetry and patient allowed RN to put back on. Will try again for dressing change.

## 2023-02-17 ENCOUNTER — Inpatient Hospital Stay: Payer: Self-pay

## 2023-02-17 LAB — BASIC METABOLIC PANEL
Anion gap: 11 (ref 5–15)
BUN: 25 mg/dL — ABNORMAL HIGH (ref 8–23)
CO2: 22 mmol/L (ref 22–32)
Calcium: 8.8 mg/dL — ABNORMAL LOW (ref 8.9–10.3)
Chloride: 103 mmol/L (ref 98–111)
Creatinine, Ser: 1.58 mg/dL — ABNORMAL HIGH (ref 0.61–1.24)
GFR, Estimated: 49 mL/min — ABNORMAL LOW (ref >60)
Glucose, Bld: 172 mg/dL — ABNORMAL HIGH (ref 70–99)
Potassium: 4 mmol/L (ref 3.5–5.1)
Sodium: 136 mmol/L (ref 135–145)

## 2023-02-17 LAB — CBC
HCT: 31.5 % — ABNORMAL LOW (ref 39.0–52.0)
Hemoglobin: 10.6 g/dL — ABNORMAL LOW (ref 13.0–17.0)
MCH: 28.6 pg (ref 26.0–34.0)
MCHC: 33.7 g/dL (ref 30.0–36.0)
MCV: 85.1 fL (ref 80.0–100.0)
Platelets: 230 10*3/uL (ref 150–400)
RBC: 3.7 MIL/uL — ABNORMAL LOW (ref 4.22–5.81)
RDW: 14.5 % (ref 11.5–15.5)
WBC: 7.9 10*3/uL (ref 4.0–10.5)
nRBC: 0 % (ref 0.0–0.2)

## 2023-02-17 LAB — GLUCOSE, CAPILLARY
Glucose-Capillary: 148 mg/dL — ABNORMAL HIGH (ref 70–99)
Glucose-Capillary: 69 mg/dL — ABNORMAL LOW (ref 70–99)
Glucose-Capillary: 75 mg/dL (ref 70–99)
Glucose-Capillary: 148 mg/dL — ABNORMAL HIGH (ref 70–99)
Glucose-Capillary: 103 mg/dL — ABNORMAL HIGH (ref 70–99)

## 2023-02-17 LAB — PROTIME-INR
INR: 2.4 — ABNORMAL HIGH (ref 0.8–1.2)
Prothrombin Time: 26 seconds — ABNORMAL HIGH (ref 11.4–15.2)

## 2023-02-17 LAB — LACTATE DEHYDROGENASE: LDH: 166 U/L (ref 98–192)

## 2023-02-17 MED ORDER — INSULIN ASPART 100 UNIT/ML IJ SOLN
0.0000 [IU] | Freq: Three times a day (TID) | INTRAMUSCULAR | Status: DC
  Administered 2023-02-17 – 2023-02-19 (×6): 1 [IU] via SUBCUTANEOUS

## 2023-02-17 MED ORDER — WARFARIN SODIUM 4 MG PO TABS
4.0000 mg | ORAL_TABLET | Freq: Once | ORAL | Status: AC
  Administered 2023-02-17: 4 mg via ORAL
  Filled 2023-02-17: qty 1

## 2023-02-17 NOTE — Progress Notes (Signed)
[facility] for  warfarin  Indication: LVAD  Allergies  Allergen Reactions   Vancomycin Nausea And Vomiting and Other (See Comments)    Severe joint pain    Patient Measurements: Height: '5\' 10"'$  (177.8 cm) Weight: 85.4 kg (188 lb 4.4 oz) IBW/kg (Calculated) : 73   Vital Signs: Temp: 98 F (36.7 C) (03/11 0751) Temp Source: Oral (03/11 0751) BP: 107/78 (03/11 0751) Pulse Rate: 83 (03/11 0751)  Labs: Recent Labs    02/15/23 0027 02/16/23 0617 02/17/23 0543  HGB 10.4* 10.3* 10.6*  HCT 31.6* 30.4* 31.5*  PLT 246 225 230  LABPROT 21.0* 25.7* 26.0*  INR 1.8* 2.4* 2.4*  CREATININE 1.30* 1.76* 1.58*     Estimated Creatinine Clearance: 50.1 mL/min (A) (by C-G formula based on SCr of 1.58 mg/dL (H)).   Medical History: Past Medical History:  Diagnosis Date   "    Arthritis    CAD (coronary artery disease)    a. s/p CABG in 11/2019 with LIMA-LAD, SVG-OM1, SVG-PDA and SVG-D1   CHF (congestive heart failure) (HCC)    a. EF < 20% by echo in 11/2019   Essential hypertension    PAF (paroxysmal atrial fibrillation) (HCC)    Type 2 diabetes mellitus (HCC)     Medications:   DAPTOmycin (CUBICIN) 700 mg in sodium chloride 0.9 % IVPB 700 mg (02/16/23 2148)    Assessment: 62 yo male on chronic Coumadin for LVAD, poor compliance with outpatient checks. INR on admission 1.1. S/p OR 2/20 for drive line infection debridement. Previous Coumadin dose 6 mg daily except 4 mg on Tues, Thurs.  INR today is therapeutic at 2.4, CBC and LDH stable.  Goal of Therapy:  INR 2-2.4 (keeping low in anticipation of return to OR soon) Monitor platelets by anticoagulation protocol: Yes   Plan:  Warfarin '4mg'$  PO x1 tonight Daily INR  Arrie Senate, PharmD, BCPS, [facility] Clinical Pharmacist [phone] Please check AMION for all [facility] numbers 02/17/2023

## 2023-02-17 NOTE — Progress Notes (Signed)
5 Days Post-Op Procedure(s) (LRB): ABDOMINAL DRIVELINE WOUND DEBRIDEMENT AND APPLICATION OF MATRIX WOUND DRESSING (N/A) REMOVAL OF WOUND VAC DRESSING (N/A) Subjective: Patient denies complaints and in fact feels better today. Ambulated in hallway with physical therapy. The VAD tunnel wound has been packed daily with hypochlorous acid wound solution wet to dry.  Today I personally changed the sterile surgical dressing at the bedside.  The wound measures approximately 5 cm x 3 cm x 1 cm deep.  There is no purulence.  There is some remaining tissue matrix (SDR M).  I cleaned the wound with hypochlorous acid and repacked the wound with hypochlorous wet-to-dry dressings.  The patient should be ready for transfer to skilled nursing facility after PICC line for IV antibiotics.  Further surgical debridement needed.  Objective: Vital signs in last 24 hours: Temp:  [97.6 F (36.4 C)-98.3 F (36.8 C)] 98.3 F (36.8 C) (03/11 1129) Pulse Rate:  [83-99] 99 (03/11 1129) Cardiac Rhythm: Normal sinus rhythm (03/10 2059) Resp:  [15-25] 19 (03/11 1129) BP: (64-116)/(52-91) 116/81 (03/11 1129) SpO2:  [96 %] 96 % (03/10 1900) Weight:  [85.4 kg] 85.4 kg (03/11 0630)  Hemodynamic parameters for last 24 hours:  Afebrile, sinus rhythm.  Intake/Output from previous day: 03/10 0701 - 03/11 0700 In: 300 [P.O.:300] Out: -  Intake/Output this shift: No intake/output data recorded.       Exam    General- alert and comfortable    Neck- no JVD, no cervical adenopathy palpable, no carotid bruit   Lungs- clear without rales, wheezes   Cor- regular rate and rhythm, normal VAD hum   Abdomen- soft, non-tender   Extremities - warm, non-tender, minimal edema   Neuro- oriented, appropriate, no focal weakness   Lab Results: Recent Labs    02/16/23 0617 02/17/23 0543  WBC 8.6 7.9  HGB 10.3* 10.6*  HCT 30.4* 31.5*  PLT 225 230   BMET:  Recent Labs    02/16/23 0617 02/17/23 0543  NA 135 136  K 3.8  4.0  CL 101 103  CO2 22 22  GLUCOSE 167* 172*  BUN 21 25*  CREATININE 1.76* 1.58*  CALCIUM 8.7* 8.8*    PT/INR:  Recent Labs    02/17/23 0543  LABPROT 26.0*  INR 2.4*   ABG    Component Value Date/Time   PHART 7.508 (H) 11/20/2020 0438   HCO3 24.8 09/09/2021 0313   TCO2 29 11/20/2020 0438   ACIDBASEDEF 6.0 (H) 11/24/2019 1718   O2SAT 63.4 09/09/2021 0313   CBG (last 3)  Recent Labs    02/17/23 0625 02/17/23 1126 02/17/23 1150  GLUCAP 148* 69* 75    Assessment/Plan: S/P Procedure(s) (LRB): ABDOMINAL DRIVELINE WOUND DEBRIDEMENT AND APPLICATION OF MATRIX WOUND DRESSING (N/A) REMOVAL OF WOUND VAC DRESSING (N/A) MRSA infection of the VAD tunnel.  Wound starting to firm up and become more superficial.  Continue with IV antibiotics and daily wound wet-to-dry dressing change at skilled nursing facility.  Weekly follow-up in the VAD clinic for wound management.   LOS: 21 days    Kevin Underwood 02/17/2023

## 2023-02-17 NOTE — TOC Progression Note (Addendum)
Transition of Care (TOC) - Progression Note    Patient Details  Name: Kevin Underwood. MRN: [id number] Date of Birth: 1961/09/28  Transition of Care [facility]) CM/SW [place], [place] Phone Number: 02/17/2023, 1:45 PM  Clinical Narrative:    CSW followed up with [facility] who note they can accommodate an LVAD, but declined due to a + UDS, and ongoing substance use. No facilities have been located that will accept pt due to this barrier. It is unlikely for pt to be accepted, as placing a pt in SNF with active substance use is not achieved unless it can be documented the pt is physically or mentally unable to use. The pt does not meet those criteria. As Meth is able to be injected, discharging home with IV ABX is also not a likely disposition. CSW confirmed with CM that getting HH or infusions with current Substance use is also not going to be an option due to safety risks. At this time pt will likely need to be in house for duration of IV ABX. Disposition will need to be re evaluated at this time. TOC will continue to follow.    Expected Discharge Plan: [place] Barriers to Discharge: Continued Medical Work up  Expected Discharge Plan and Services   Discharge Planning Services: CM Consult Post Acute Care Choice: [place] Living arrangements for the past 2 months: [place]: RN           Social Determinants of Health (SDOH) Interventions SDOH Screenings   Food Insecurity: No Food Insecurity (02/05/2023)  Housing: Low Risk  (02/05/2023)  Transportation Needs: Unmet Transportation Needs (02/05/2023)  Utilities: Not At Risk (02/05/2023)  Alcohol Screen: Low Risk  (11/10/2020)  Depression (PHQ2-9): Low Risk  (10/16/2022)  Financial Resource Strain: Medium Risk (05/17/2022)  Physical Activity: Inactive (11/10/2020)  Tobacco Use: Medium Risk (02/13/2023)    Readmission Risk Interventions     07/01/2022    9:05 AM 11/28/2020   11:57 AM  Readmission Risk Prevention Plan  Transportation Screening Complete Complete  Medication Review ([place]) Complete Complete  PCP or Specialist appointment within 3-5 days of discharge Complete   HRI or [employer] Complete Complete  SW Recovery Care/Counseling Consult Complete Complete  [place] Not Applicable Not [place] Not Applicable Not Applicable

## 2023-02-17 NOTE — Progress Notes (Signed)
Mobility Specialist Progress Note:   02/17/23 0940  Mobility  Activity Ambulated with assistance in hallway  Level of Assistance Modified independent, requires aide device or extra time  Assistive Device None  Distance Ambulated (ft) 400 ft  Activity Response Tolerated well  $Mobility charge 1 Mobility   Pt up in room willing to participate in mobility. No complaints of pain. Left in room with call bell in reach and all need met.   Gareth Eagle Fradel [place] Mobility Specialist Please contact via [employer] or  Rehab Office at [phone]

## 2023-02-17 NOTE — Progress Notes (Signed)
LVAD Coordinator Rounding Note:  Admitted 01/27/23 to Heart Failure service from ED due to driveline infection. Started on IV antibiotics.   S/p driveline debridement in OR 2/21,2/28 and 3/7. Pt now having daily dressing changes with Vashe solution.  Pt had labile BPs over the weekend with low flow alarms.   Pts sister and niece at bedside this morning for training on wound care. They are unsure if they will be able to do the IV antibiotics at d/c.  Pt will need home IV antibiotics at discharge with a stop date of 03/11/23. Pt will need placement in SNF for IV antibiotics at discharge as he is living with his mom and she is unable to help him with care/medication administration. VAD coordinator spoke with his wife last week and they are not currently getting along. It would be much safer for him to be in a SNF post discharge for IV antibiotics. Inpatient social work awaiting bed offers for placement. Pt has been denied admission to [facility] and [facility]. Search for placement ongoing.   Controller electively exchanged this admission. No further low voltage hazard alarms seen on interrogation.  Vital signs: Temp:  98.3 HR: 99 Doppler Pressure: not documented Automatic BP: 107/78 (88)  O2 Sat: 96% on RA Wt:192.9>197>195.9>190.9>187.6>188.9>184.1>189.6>182.3>181.8>182.3>183.4>191.6>184.5>188.2 lbs  LVAD interrogation reveals:  Speed: 5600 Flow: 4.4 Power: 4.1 w PI: 4.8 Alarms: 1 low flow on 3/10 and 5 on 3/9 Events: >100 Hct: 32  Fixed speed:  5600 Low speed limit:  5300  Drive Line:  Existing VAD dressing removed and site care performed using sterile technique by DR Darcey Nora. Small amount of serosanguineous drainage noted in wound bed. Debride with dry 2 x 2. Drive line exit site cleaned with Chlora prep applicators x 2, allowed to dry. Rylon sheet intact.Vashe soaked 2x2 gauze placed on top of rylon sheet followed by dry gauze. No redness, foul odor, or tenderness noted. Drive  line anchor replaced. Daily sterile wet to dry dressing changes per bedside RN or VAD coordinator. Next dressing change in tomorrow 02/18/23 by VAD coordinator.      Labs:  LDH trend: [id number]  INR trend: 1.1>1.1>1.4>1.9>2.2>2.4>1.8>1.9>2.2>2.5>2.4>1.7>2.4  Anticoagulation Plan: -INR Goal: 2.0 - 2.5 -ASA Dose: none -Heparin 500 u/hr -off  ICD: N/A  Infection:  01/27/23>> blood cultures>> no growth final 01/27/23>>driveline culture>>FEW METHICILLIN RESISTANT STAPHYLOCOCCUS AUREUS  01/28/23>>driveline cx from OR>>FEW METHICILLIN RESISTANT STAPHYLOCOCCUS AUREUS; final  Plan/Recommendations:  Page VAD coordinator with equipment issues or driveline problems Continue daily dressing changes by bedside nurse or VAD Coordinator.  [name]  RN,BSN [place] Coordinator  Office: [phone]  24/7 Pager: [phone]

## 2023-02-17 NOTE — Inpatient Diabetes Management (Signed)
Inpatient Diabetes Program Recommendations  AACE/ADA: New Consensus Statement on Inpatient Glycemic Control   Target Ranges:  Prepandial:   less than 140 mg/dL      Peak postprandial:   less than 180 mg/dL (1-2 hours)      Critically ill patients:  140 - 180 mg/dL    Latest Reference Range & Units 02/17/23 06:25 02/17/23 11:26 02/17/23 11:50  Glucose-Capillary 70 - 99 mg/dL 148 (H) 69 (L) 75    Latest Reference Range & Units 02/16/23 05:17 02/16/23 11:34 02/16/23 16:28 02/16/23 21:18  Glucose-Capillary 70 - 99 mg/dL 135 (H) 121 (H) 111 (H) 133 (H)   Review of Glycemic Control  Diabetes history: DM2 Outpatient Diabetes medications: Glipizide 5 mg daily (not taking), Metformin 1000 mg BID Current orders for Inpatient glycemic control: Novolog 0-24 units TID with meals, Novolog 0-5 units QHS, Glipizide 5 mg QAM, Metformin 500 mg QAM  Inpatient Diabetes Program Recommendations:    Insulin: Noted glucose down to 69 mg/dl today after getting Novolog 2 units for correction along with Metformin and Glipizide.  Please consider discontinuing Novolog 0-24 units TID correction and order Novolog 0-9 units TID with meals.  Thanks, Barnie Alderman, RN, MSN, [place] Diabetes Coordinator Inpatient Diabetes Program [phone] (Team Pager from 8am to [place])

## 2023-02-17 NOTE — Progress Notes (Addendum)
Patient ID: Kevin Millers., male   DOB: 11/04/1961, 62 y.o.   MRN: [id number]   Advanced Heart Failure VAD Team Note  PCP-Cardiologist: Rozann Lesches, MD  AHF: Dr. Aundra Dubin   Subjective:   -Driveline site infection.  Wound Cx with MRSA. On IV vancomycin.  OR for debridement w/ wound vac placement 2/20 and 2/28. -Psych consulted and saw patient 3/3 for suicidal ideation/depression. Patient now on Remeron.  -Underwent I&D with wound vac removal 3/6  He continues to feel very anxious and depressed but no suicidal ideation.   sCr 1.3>1.76>1.58; likely due to hypovolemia and hypotension. Improved with IVF. 1L during the day and 582m overnight  Had a rough day yesterday but this morning woke up feeling "great". Able to eat all of his breakfast. Denies CP/SOB.   LVAD INTERROGATION:  HeartMate III LVAD:   Flow 3.7 liters/min, speed 5600, power 4.1, PI 5.5 128 PI events so far today; no speed drops.   Objective:    Vital Signs:   Temp:  [97.6 F (36.4 C)-97.8 F (36.6 C)] 97.6 F (36.4 C) (03/11 0347) Resp:  [15-25] 16 (03/11 0630) BP: (64-156)/(52-108) 103/91 (03/11 0347) SpO2:  [96 %] 96 % (03/10 1900) Weight:  [85.4 kg] 85.4 kg (03/11 0630) Last BM Date : 02/07/23 Mean arterial Pressure 60s-90s  Intake/Output:   Intake/Output Summary (Last 24 hours) at 02/17/2023 0710 Last data filed at 02/16/2023 1100 Gross per 24 hour  Intake 300 ml  Output --  Net 300 ml    Physical Exam   General:  Well appearing. No resp difficulty HEENT: Normal Neck: supple. JVP ~7 . Carotids 2+ bilat; no bruits. No lymphadenopathy or thyromegaly appreciated. Cor: Mechanical heart sounds with LVAD hum present. Lungs: Clear Abdomen: soft, nontender, nondistended. No hepatosplenomegaly. No bruits or masses. Good bowel sounds. Driveline: C/D/I; securement device intact. Gauze and medipore dressing in place.  Extremities: no cyanosis, clubbing, rash, edema Neuro: alert & orientedx3, cranial  nerves grossly intact. moves all 4 extremities w/o difficulty. Affect pleasant   Telemetry   NSR 70s (Personally reviewed)    Labs   Basic Metabolic Panel: Recent Labs  Lab 02/12/23 0615 02/13/23 0017 02/13/23 1238 02/14/23 0842 02/15/23 0027 02/16/23 0617 02/17/23 0543  NA 135   < > 140 136 137 135 136  K 4.1   < > 4.2 3.8 4.1 3.8 4.0  CL 100   < > 105 99 106 101 103  CO2 25   < > '27 25 24 22 22  '$ GLUCOSE 152*   < > 89 132* 133* 167* 172*  BUN 18   < > 29* '18 23 21 '$ 25*  CREATININE 1.17   < > 1.46* 1.06 1.30* 1.76* 1.58*  CALCIUM 9.0   < > 8.8* 9.1 8.7* 8.7* 8.8*  MG 1.8  --   --   --   --   --   --    < > = values in this interval not displayed.   Liver Function Tests: No results for input(s): "AST", "ALT", "ALKPHOS", "BILITOT", "PROT", "ALBUMIN" in the last 168 hours.  CBC: Recent Labs  Lab 02/13/23 0017 02/14/23 0842 02/15/23 0027 02/16/23 0617 02/17/23 0543  WBC 10.5 9.5 9.0 8.6 7.9  HGB 9.9* 11.8* 10.4* 10.3* 10.6*  HCT 29.0* 35.4* 31.6* 30.4* 31.5*  MCV 85.0 83.7 85.4 84.9 85.1  PLT 240 278 246 225 230   INR: Recent Labs  Lab 02/13/23 0017 02/14/23 [id number]:[id number]/09/24 0027 02/16/23 0617 02/17/23 0543  INR 2.4* 1.7* 1.8* 2.4* 2.4*    Other results:  Imaging   No results found.  Medications:     Scheduled Medications:  allopurinol  100 mg Oral Daily   amiodarone  200 mg Oral BID   clonazepam  0.25 mg Oral BID   feeding supplement (GLUCERNA SHAKE)  237 mL Oral TID WC   glipiZIDE  5 mg Oral Q breakfast   guaiFENesin  600 mg Oral BID   insulin aspart  0-24 Units Subcutaneous TID WC   insulin aspart  0-5 Units Subcutaneous QHS   loratadine  10 mg Oral Daily   metFORMIN  500 mg Oral Q breakfast   metoprolol succinate  25 mg Oral Daily   minocycline  200 mg Oral BID   mirtazapine  15 mg Oral QHS   multivitamin with minerals  1 tablet Oral Q24H   pantoprazole  40 mg Oral Daily   sertraline  50 mg Oral Daily   tamsulosin  0.4 mg Oral Daily    thiamine  100 mg Oral Daily   traZODone  150 mg Oral QHS   Warfarin - Pharmacist Dosing Inpatient   Does not apply q1600   zinc sulfate  220 mg Oral Daily    Infusions:  DAPTOmycin (CUBICIN) 700 mg in sodium chloride 0.9 % IVPB 700 mg (02/16/23 2148)    PRN Medications: acetaminophen, colchicine, fluticasone, hydrALAZINE, ipratropium, levalbuterol, ondansetron (ZOFRAN) IV, mouth rinse, prochlorperazine  Patient Profile  62 y.o. 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 type 2 diabetes mellitus, COPD, hypertension, and HMIII VAD.    Admitted with recurrent LVAD complication driveline infection + suspected AECOPD Assessment/Plan:   1. LVAD Complication---> Recurrent driveline infection: MRSA driveline infection, admitted in 6/23 and again in 7/23-8/23 with multiple trips to the OR for debridement. He had completed vancomycin and then long-term doxycycline. Most recent culture PTA grew acinetobacter and MRSA. ID started minocycline. He had been off minocycline for at least 3 days prior to this admit w/ copious drainage around driveline. CT chest/abd/pelvis showed stranding around the driveline and small amount of fluid. Started on Vanc and Meropenum per ID. Now off meropenem (2/21).  OR for debridement w/ wound vac placement 2/20. Wound culture with MRSA, blood cultures negative.  Afebrile.  - Will need 6 wks abx. Vanc switched to dapto 2/27 with nausea. Now on dapto/minocycline. Once dapto complete continue suppressive minocycline   - Remains AF on IV abx - Returned to OR for I&D and wound vac removal 3/6. Site looks ok today 2. Chronic HFrEF: Echo 10/21 with EF 20-25%, mildly decreased RV function. LHC/RHC in 12/21 with patent grafts, low output. Suspect mixed ischemic/nonischemic cardiomyopathy (prior heavy ETOH and drugs as well  as CAD).  No ETOH, drugs, smoking since CABG in 12/20. Admitted with cardiogenic shock in 12/21, had placement of Impella 5.5 initially, now s/p Heartmate 3 LVAD on 11/17/20.   - Volume status/SCr improved after IVF yesterday, Avoid diuretics  - INR 2.4. Discussed dosing with PharmD personally. Adjusting warfarin dose.  - Not a candidate for transplant with ongoing drug use.  3. HTN: MAPs elevated low 100s on admit, poor compliance with meds. Has fluctuated significantly while admitted. MAPs will elelvate in setting of emotional distress - Can continue Toprol XL 25 mg daily  - Hypotensive overnight likely due to hypovolemia. Now off  losartan. S/p 1.5 L yesterday 4. Polysubstance Abuse: Recently started using methamphetamine again.  UDS + for amphetamines.  - SW team following. Unfortunately, limited resources for inpatient substance use due LVAD - Having paroxysms of n/v. He believes it is all due to anxiety.  - Psych saw patient 3/3, now on remeron and klonopin 5. PAF: S/p Maze and LA appendage clip with CABG in 12/20.  NSR this admission.  6. 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  Still some wheezing but has improved.   - Continue scheduled atrovent.  - Levalbuterol prn.  - Have avoided prednisone with surgery at driveline site for recurrent infection.   7. DMII - SSI  8.  NSVT/PVCs: Frequent NSVT 2/23, may have been dry after diuresis.  Amiodarone gtt started to suppress.  - Continue po amiodarone 200 mg bid.  - Would eventually stop this medication, avoid with COPD.   9. Nausea/vomiting - Began 2/27. Assumed 2/2 vancomycin initially however vanc switched to daptomycin and nausea continues.  - KUB ok - suspect etiology due to severe emotional distress/anxiety +/- substance abuse WD  10. Major depression - Patient refusing to work with PT, not motivated and expresses frustration with his overall situation.  Ongoing drug use, no longer living with his wife.  Has been living with his mother who cannot  care for him.  - Now saying that he would like his VAD to be off so that his wife and mother don't have him in common any more  - Psych consulted and saw patient, now on remeron 11. AKI - sCr 1.3>1.76>1.58; likely due to hypovolemia and hypotension. Now off losartan;  s/p 1L IVF +568m overnight.  12. NSVT: No further NSVT.  - Keep K > 4.0 Mg > 2.0 13. Dispo - pending SNF placement  I reviewed the LVAD parameters from today, and compared the results to the patient's prior recorded data.  No programming changes were made.  The LVAD is functioning within specified parameters.  The patient performs LVAD self-test daily.  LVAD interrogation was negative for any significant power changes, alarms or PI events/speed drops.  LVAD equipment check completed and is in good working order.  Back-up equipment present.   LVAD education done on emergency procedures and precautions and reviewed exit site care.  Length of Stay: 2[place] NP 02/17/2023, 7:10 AM  VAD Team --- VAD ISSUES ONLY--- Pager [phone](7am - 7am)  Advanced Heart Failure Team  Pager [phone](M-F; 7a - 5p)  Please contact [facility] for night-coverage after hours (5p -7a ) and weekends on [email or web address]  Patient seen with NP, agree with the above note.   He feels much better today.  MAP 80s-90s, denies dyspnea.  Creatinine stable 1.58.   General: Well appearing this am. NAD.  HEENT: Normal. Neck: Supple, JVP 7-8 cm. Carotids OK.  Cardiac:  Mechanical heart sounds with LVAD hum present.  Lungs:  CTAB, normal effort.  Abdomen:  NT, ND, no HSM. No bruits or masses. +BS  LVAD exit site: Well-healed and incorporated. Dressing dry and intact. No erythema or drainage. Stabilization device present and accurately applied. Driveline dressing changed daily per sterile technique. Extremities:  Warm and dry. No cyanosis, clubbing, rash, or edema.  Neuro:  Alert & oriented x 3. Cranial nerves grossly intact. Moves all 4 extremities  w/o difficulty. Affect pleasant    Mood seems improved.   Volume status looks stable.  Will keep him off Lasix for now.  MAP acceptable, seems to  rise with anxiety.   He will continue daptomycin and minocycline x 6 wks, then minocycline alone.   Hopefully to SN[facility] at [facility] if insurance approves.   Loralie Champagne 02/17/2023

## 2023-02-18 LAB — GLUCOSE, CAPILLARY
Glucose-Capillary: 125 mg/dL — ABNORMAL HIGH (ref 70–99)
Glucose-Capillary: 146 mg/dL — ABNORMAL HIGH (ref 70–99)
Glucose-Capillary: 118 mg/dL — ABNORMAL HIGH (ref 70–99)
Glucose-Capillary: 175 mg/dL — ABNORMAL HIGH (ref 70–99)

## 2023-02-18 LAB — BASIC METABOLIC PANEL
Anion gap: 10 (ref 5–15)
BUN: 28 mg/dL — ABNORMAL HIGH (ref 8–23)
CO2: 23 mmol/L (ref 22–32)
Calcium: 8.9 mg/dL (ref 8.9–10.3)
Chloride: 103 mmol/L (ref 98–111)
Creatinine, Ser: 1.39 mg/dL — ABNORMAL HIGH (ref 0.61–1.24)
GFR, Estimated: 57 mL/min — ABNORMAL LOW (ref >60)
Glucose, Bld: 125 mg/dL — ABNORMAL HIGH (ref 70–99)
Potassium: 4.2 mmol/L (ref 3.5–5.1)
Sodium: 136 mmol/L (ref 135–145)

## 2023-02-18 LAB — CBC
HCT: 30.6 % — ABNORMAL LOW (ref 39.0–52.0)
Hemoglobin: 10.4 g/dL — ABNORMAL LOW (ref 13.0–17.0)
MCH: 28.3 pg (ref 26.0–34.0)
MCHC: 34 g/dL (ref 30.0–36.0)
MCV: 83.4 fL (ref 80.0–100.0)
Platelets: 208 10*3/uL (ref 150–400)
RBC: 3.67 MIL/uL — ABNORMAL LOW (ref 4.22–5.81)
RDW: 14.4 % (ref 11.5–15.5)
WBC: 7.4 10*3/uL (ref 4.0–10.5)
nRBC: 0 % (ref 0.0–0.2)

## 2023-02-18 LAB — PROTIME-INR
INR: 2.3 — ABNORMAL HIGH (ref 0.8–1.2)
Prothrombin Time: 25.4 seconds — ABNORMAL HIGH (ref 11.4–15.2)

## 2023-02-18 LAB — LACTATE DEHYDROGENASE: LDH: 161 U/L (ref 98–192)

## 2023-02-18 LAB — CK: Total CK: 57 U/L (ref 49–397)

## 2023-02-18 MED ORDER — SODIUM CHLORIDE 0.9% FLUSH: 10.0000 mL | INTRAVENOUS | Status: DC | PRN

## 2023-02-18 MED ORDER — CHLORHEXIDINE GLUCONATE CLOTH 2 % EX PADS
6.0000 | MEDICATED_PAD | Freq: Every day | CUTANEOUS | Status: DC
  Administered 2023-02-18 – 2023-02-20 (×3): 6 via TOPICAL

## 2023-02-18 MED ORDER — SODIUM CHLORIDE 0.9 % IV BOLUS
250.0000 mL | Freq: Once | INTRAVENOUS | Status: AC
  Administered 2023-02-18: 250 mL via INTRAVENOUS

## 2023-02-18 MED ORDER — SODIUM CHLORIDE 0.9 % IV SOLN: Freq: Once | INTRAVENOUS | Status: DC

## 2023-02-18 MED ORDER — WARFARIN SODIUM 4 MG PO TABS
4.0000 mg | ORAL_TABLET | Freq: Once | ORAL | Status: AC
  Administered 2023-02-18: 4 mg via ORAL
  Filled 2023-02-18: qty 1

## 2023-02-18 MED ORDER — SODIUM CHLORIDE 0.9% FLUSH
10.0000 mL | Freq: Two times a day (BID) | INTRAVENOUS | Status: DC
  Administered 2023-02-18: 10 mL
  Administered 2023-02-19: 20 mL
  Administered 2023-02-19 – 2023-02-20 (×2): 10 mL

## 2023-02-18 MED ORDER — LOSARTAN POTASSIUM 25 MG PO TABS: 12.5000 mg | ORAL_TABLET | Freq: Every day | ORAL | Status: DC

## 2023-02-18 NOTE — Progress Notes (Addendum)
LVAD Coordinator Rounding Note:  Admitted 01/27/23 to Heart Failure service from ED due to driveline infection. Started on IV antibiotics.   S/p driveline debridement in OR 2/21,2/28 and 3/7. Pt now having daily dressing changes with Vashe solution.  Pt with LF alarms this morning while I was rounding. BP map was 123. Pt is c/o nausea, headache and feeling poor. Bedside nurse in room giving PRN hydralazine and Zofran. VAD coordinator alerted provider team pt will need low dose BP med for coverage. Provider team ordered 12.5 losartan qhs.   Pt will need home IV antibiotics at discharge with a stop date of 03/11/23. Pt will need placement in SNF for IV antibiotics at discharge as he is living with his mom and she is unable to help him with care/medication administration. [facility]. [facility] admissions coordinator is aware of pts drug history. I have reached out to [employer] per Edwin Cap to obtain PA for pts [employer].   Controller electively exchanged this admission. No further low voltage hazard alarms seen on interrogation.  Vital signs: Temp:  98.1 HR: 87 Doppler Pressure: not documented Automatic BP: 159/96 (114)  O2 Sat: 95% on RA Wt:192.9>197>195.9>190.9>187.6>188.9>184.1>189.6>182.3>181.8>182.3>183.4>191.6>184.5>188.2>188.4 lbs  LVAD interrogation reveals:  Speed: 5600 Flow: 3.1 Power: 4.4 w PI: 9.5 Alarms: a few LF this mornign Events: >100 Hct: 30  Fixed speed:  5600 Low speed limit:  5300  Drive Line:  Existing VAD dressing removed and site care performed using sterile technique by DR Darcey Nora. Small amount of serosanguineous drainage noted in wound bed. Debride with dry 2 x 2. Drive line exit site cleaned with Chlora prep applicators x 2, allowed to dry. Rylon sheet intact.Vashe soaked 2x2 gauze placed on top of rylon sheet followed by dry gauze. No redness, foul odor, or tenderness noted. Drive line anchor replaced. Daily sterile wet to  dry dressing changes per bedside RN or VAD coordinator. Next dressing change in tomorrow 02/19/23 by VAD coordinator.    Labs:  LDH trend: 203>187>142>145>139>161>155>148>152>149>166>151>156>158>166>161  INR trend: 1.1>1.1>1.4>1.9>2.2>2.4>1.8>1.9>2.2>2.5>2.4>1.7>2.4>2.3  Anticoagulation Plan: -INR Goal: 2.0 - 2.5 -ASA Dose: none -Heparin 500 u/hr -off  ICD: N/A  Infection:  01/27/23>> blood cultures>> no growth final 01/27/23>>driveline culture>>FEW METHICILLIN RESISTANT STAPHYLOCOCCUS AUREUS  01/28/23>>driveline cx from OR>>FEW METHICILLIN RESISTANT STAPHYLOCOCCUS AUREUS; final  Plan/Recommendations:  Page VAD coordinator with equipment issues or driveline problems Continue daily dressing changes by bedside nurse or VAD Coordinator.  [name]  RN,BSN [place] Coordinator  Office: [phone]  24/7 Pager: [phone]

## 2023-02-18 NOTE — Progress Notes (Signed)
6 Days Post-Op Procedure(s) (LRB): ABDOMINAL DRIVELINE WOUND DEBRIDEMENT AND APPLICATION OF MATRIX WOUND DRESSING (N/A) REMOVAL OF WOUND VAC DRESSING (N/A) Subjective: Patient feels poorly this am with nausea and fatigue assoc with low VAD flows from hypertension. Received iv apresoline, zofran. Anti hypertensive po meds being titrated by HF cardiology VAD tunnel wound dressing changed today with sterile technique - packed with hypochlorous acid wet/dry. Minimal  clean granulation tissue but no purulence. Measures 5 x2.5 x 1 cm Objective: Vital signs in last 24 hours: Temp:  [97.8 F (36.6 C)-98.3 F (36.8 C)] 98.1 F (36.7 C) (03/12 0802) Pulse Rate:  [79-99] 87 (03/12 0802) Cardiac Rhythm: Normal sinus rhythm (03/12 0707) Resp:  [16-19] 17 (03/12 0802) BP: (84-164)/(60-106) 124/102 (03/12 1008) SpO2:  [95 %-97 %] 95 % (03/12 0402) Weight:  [85.5 kg] 85.5 kg (03/12 0402)  Afebrile, NSR    Intake/Output from previous day: 03/11 0701 - 03/12 0700 In: 192 [IV Piggyback:192] Out: 0  Intake/Output this shift: No intake/output data recorded.       Exam    General- alert but with malaise    Neck- no JVD, no cervical adenopathy palpable, no carotid bruit   Lungs- clear without rales, wheezes   Cor- regular rate and rhythm, normal VAD hum   Abdomen- soft, non-tender   Extremities - warm, non-tender, minimal edema   Neuro- oriented, appropriate, no focal weakness   Lab Results: Recent Labs    02/17/23 0543 02/18/23 0545  WBC 7.9 7.4  HGB 10.6* 10.4*  HCT 31.5* 30.6*  PLT 230 208   BMET:  Recent Labs    02/17/23 0543 02/18/23 0545  NA 136 136  K 4.0 4.2  CL 103 103  CO2 22 23  GLUCOSE 172* 125*  BUN 25* 28*  CREATININE 1.58* 1.39*  CALCIUM 8.8* 8.9    PT/INR:  Recent Labs    02/18/23 0545  LABPROT 25.4*  INR 2.3*   ABG    Component Value Date/Time   PHART 7.508 (H) 11/20/2020 0438   HCO3 24.8 09/09/2021 0313   TCO2 29 11/20/2020 0438   ACIDBASEDEF  6.0 (H) 11/24/2019 1718   O2SAT 63.4 09/09/2021 0313   CBG (last 3)  Recent Labs    02/17/23 1719 02/17/23 2110 02/18/23 0603  GLUCAP 148* 103* 125*    Assessment/Plan: S/P Procedure(s) (LRB): ABDOMINAL DRIVELINE WOUND DEBRIDEMENT AND APPLICATION OF MATRIX WOUND DRESSING (N/A) REMOVAL OF WOUND VAC DRESSING (N/A) Cont iv antibiotic- PICC planned Daily wound change with hypochlorous acid wet/dry   LOS: 22 days    Kevin Underwood 02/18/2023

## 2023-02-18 NOTE — Plan of Care (Signed)
  Problem: Education: Goal: Patient will understand all VAD equipment and how it functions Outcome: Progressing Goal: Patient will be able to verbalize current INR target range and antiplatelet therapy for discharge home Outcome: Progressing   Problem: Cardiac: Goal: LVAD will function as expected and patient will experience no clinical alarms Outcome: Progressing   Problem: Education: Goal: Knowledge of General Education information will improve Description: Including pain rating scale, medication(s)/side effects and non-pharmacologic comfort measures Outcome: Progressing   Problem: Health Behavior/Discharge Planning: Goal: Ability to manage health-related needs will improve Outcome: Progressing   Problem: Clinical Measurements: Goal: Ability to maintain clinical measurements within normal limits will improve Outcome: Progressing Goal: Will remain free from infection Outcome: Progressing Goal: Diagnostic test results will improve Outcome: Progressing Goal: Respiratory complications will improve Outcome: Progressing Goal: Cardiovascular complication will be avoided Outcome: Progressing   Problem: Activity: Goal: Risk for activity intolerance will decrease Outcome: Progressing   Problem: Nutrition: Goal: Adequate nutrition will be maintained Outcome: Progressing   Problem: Coping: Goal: Level of anxiety will decrease Outcome: Progressing   Problem: Elimination: Goal: Will not experience complications related to bowel motility Outcome: Progressing Goal: Will not experience complications related to urinary retention Outcome: Progressing   Problem: Pain Managment: Goal: General experience of comfort will improve Outcome: Progressing   Problem: Safety: Goal: Ability to remain free from injury will improve Outcome: Progressing   Problem: Skin Integrity: Goal: Risk for impaired skin integrity will decrease Outcome: Progressing   

## 2023-02-18 NOTE — Progress Notes (Signed)
Peripherally Inserted Central Catheter Placement  The IV Nurse has discussed with the patient and/or persons authorized to consent for the patient, the purpose of this procedure and the potential benefits and risks involved with this procedure.  The benefits include less needle sticks, lab draws from the catheter, and the patient may be discharged home with the catheter. Risks include, but not limited to, infection, bleeding, blood clot (thrombus formation), and puncture of an artery; nerve damage and irregular heartbeat and possibility to perform a PICC exchange if needed/ordered by physician.  Alternatives to this procedure were also discussed.  Bard Power PICC patient education guide, fact sheet on infection prevention and patient information card has been provided to patient /or left at bedside.    PICC Placement Documentation  PICC Single Lumen 02/18/23 Right Brachial 41 cm 0 cm (Active)  Indication for Insertion or Continuance of Line Home intravenous therapies (PICC only) 02/18/23 1200  Exposed Catheter (cm) 0 cm 02/18/23 1200  Site Assessment Clean, Dry, Intact 02/18/23 1200  Line Status Flushed;Saline locked;Blood return noted 02/18/23 1200  Dressing Type Transparent;Securing device 02/18/23 1200  Dressing Status Antimicrobial disc in place;Clean, Dry, Intact 02/18/23 1200  Safety Lock Not Applicable [id number]  Line Care Connections checked and tightened 02/18/23 1200  Line Adjustment (NICU/IV Team Only) No 02/18/23 1200  Dressing Intervention New dressing 02/18/23 1200  Dressing Change Due 02/25/23 02/18/23 1200       Holley Bouche Renee 02/18/2023, 12:52 PM

## 2023-02-18 NOTE — TOC Progression Note (Signed)
Transition of Care (TOC) - Progression Note    Patient Details  Name: Kevin Underwood. MRN: [id number] Date of Birth: [birth year]  Transition of Care [facility]) CM/SW Contact  Cyndi Bender, RN Phone Number: 02/18/2023, 1:23 PM  Clinical Narrative:     Per Heart failure RN, ,[name], note Patient has been accepted to  [facility]. CMA Levada Dy will request insurance authorization. TOC following.    Expected Discharge Plan: [place] Barriers to Discharge: Continued Medical Work up  Expected Discharge Plan and Services   Discharge Planning Services: CM Consult Post Acute Care Choice: [place] Living arrangements for the past 2 months: [place]: RN           Social Determinants of Health (SDOH) Interventions SDOH Screenings   Food Insecurity: No Food Insecurity (02/05/2023)  Housing: Low Risk  (02/05/2023)  Transportation Needs: Unmet Transportation Needs (02/05/2023)  Utilities: Not At Risk (02/05/2023)  Alcohol Screen: Low Risk  (11/10/2020)  Depression (PHQ2-9): Low Risk  (10/16/2022)  Financial Resource Strain: Medium Risk (05/17/2022)  Physical Activity: Inactive (11/10/2020)  Tobacco Use: Medium Risk (02/13/2023)    Readmission Risk Interventions    07/01/2022    9:05 AM 11/28/2020   11:57 AM  Readmission Risk Prevention Plan  Transportation Screening Complete Complete  Medication Review ([place]) Complete Complete  PCP or Specialist appointment within 3-5 days of discharge Complete   HRI or [place] Complete Complete  SW Recovery Care/Counseling Consult Complete Complete  [place] Not Applicable Not [place] Not Applicable Not Applicable

## 2023-02-18 NOTE — Progress Notes (Signed)
[place] for  warfarin  Indication: LVAD  Allergies  Allergen Reactions   Vancomycin Nausea And Vomiting and Other (See Comments)    Severe joint pain    Patient Measurements: Height: '5\' 10"'$  (177.8 cm) Weight: 85.5 kg (188 lb 7.9 oz) IBW/kg (Calculated) : 73   Vital Signs: Temp: 97.8 F (36.6 C) (03/12 0402) Temp Source: Oral (03/12 0402) BP: 119/86 (03/12 0402) Pulse Rate: 79 (03/11 2307)  Labs: Recent Labs    02/16/23 0617 02/17/23 0543 02/18/23 0545  HGB 10.3* 10.6* 10.4*  HCT 30.4* 31.5* 30.6*  PLT 225 230 208  LABPROT 25.7* 26.0* 25.4*  INR 2.4* 2.4* 2.3*  CREATININE 1.76* 1.58*  --      Estimated Creatinine Clearance: 50.1 mL/min (A) (by C-G formula based on SCr of 1.58 mg/dL (H)).   Medical History: Past Medical History:  Diagnosis Date   "    Arthritis    CAD (coronary artery disease)    a. s/p CABG in 11/2019 with LIMA-LAD, SVG-OM1, SVG-PDA and SVG-D1   CHF (congestive heart failure) (HCC)    a. EF < 20% by echo in 11/2019   Essential hypertension    PAF (paroxysmal atrial fibrillation) (HCC)    Type 2 diabetes mellitus (HCC)     Medications:   DAPTOmycin (CUBICIN) 700 mg in sodium chloride 0.9 % IVPB 700 mg (02/17/23 2002)    Assessment: 62 yo male on chronic Coumadin for LVAD, poor compliance with outpatient checks. INR on admission 1.1. S/p OR 2/20 for drive line infection debridement. Previous Coumadin dose 6 mg daily except 4 mg on Tues, Thurs.  INR today is therapeutic at 2.3, CBC and LDH stable.  Goal of Therapy:  INR 2-2.4 (keeping low in anticipation of return to OR soon) Monitor platelets by anticoagulation protocol: Yes   Plan:  Warfarin '4mg'$  PO x1 tonight Daily INR  Arrie Senate, PharmD, BCPS, [facility] Clinical Pharmacist [phone] Please check AMION for all [facility] Pharmacy numbers 02/18/2023

## 2023-02-18 NOTE — Progress Notes (Addendum)
Patient ID: Kevin Millers., male   DOB: 09-29-1961, 62 y.o.   MRN: [id number]   Advanced Heart Failure VAD Team Note  PCP-Cardiologist: Rozann Lesches, MD  AHF: Dr. Aundra Dubin   Subjective:   -Driveline site infection.  Wound Cx with MRSA. On IV vancomycin.  OR for debridement w/ wound vac placement 2/20 and 2/28. -Psych consulted and saw patient 3/3 for suicidal ideation/depression. Patient now on Remeron.  -Underwent I&D with wound vac removal 3/6  He continues to feel very anxious and depressed but no suicidal ideation.   sCr 1.3>1.76>1.58>1.39; likely due to hypovolemia and hypotension. Improved with IVF / increased PO intake. Weight stable  Denies CP/SOB. Tired. Ambulated a lot yesterday.   LVAD INTERROGATION:  HeartMate III LVAD:   Flow 3.3 liters/min, speed 5600, power 4.3, PI 8.8 89 PI events so far today; no speed drops.   Objective:    Vital Signs:   Temp:  [97.8 F (36.6 C)-98.3 F (36.8 C)] 97.8 F (36.6 C) (03/12 0402) Pulse Rate:  [79-99] 79 (03/11 2307) Resp:  [15-19] 16 (03/12 0402) BP: (84-123)/(60-98) 119/86 (03/12 0402) SpO2:  [95 %-97 %] 95 % (03/12 0402) Weight:  [85.5 kg] 85.5 kg (03/12 0402) Last BM Date : 02/18/23 Mean arterial Pressure 70s-90s  Intake/Output:   Intake/Output Summary (Last 24 hours) at 02/18/2023 0725 Last data filed at 02/18/2023 0400 Gross per 24 hour  Intake 192 ml  Output 0 ml  Net 192 ml    Physical Exam   General:  Well appearing. No resp difficulty HEENT: Normal Neck: supple. JVP ~7 . Carotids 2+ bilat; no bruits. No lymphadenopathy or thyromegaly appreciated. Cor: Mechanical heart sounds with LVAD hum present. Lungs: Clear Abdomen: soft, nontender, nondistended. No hepatosplenomegaly. No bruits or masses. Good bowel sounds. Driveline: C/D/I; securement device intact. Gauze and Tegaderm over DL site Extremities: no cyanosis, clubbing, rash, edema Neuro: alert & orientedx3, cranial nerves grossly intact. moves all  4 extremities w/o difficulty. Affect pleasant   Telemetry   NSR 80s (Personally reviewed)    Labs   Basic Metabolic Panel: Recent Labs  Lab 02/12/23 0615 02/13/23 0017 02/14/23 [id number] 02/15/23 0027 02/16/23 0617 02/17/23 0543 02/18/23 0545  NA 135   < > 136 137 135 136 136  K 4.1   < > 3.8 4.1 3.8 4.0 4.2  CL 100   < > 99 106 101 103 103  CO2 25   < > '25 24 22 22 23  '$ GLUCOSE 152*   < > 132* 133* 167* 172* 125*  BUN 18   < > '18 23 21 '$ 25* 28*  CREATININE 1.17   < > 1.06 1.30* 1.76* 1.58* 1.39*  CALCIUM 9.0   < > 9.1 8.7* 8.7* 8.8* 8.9  MG 1.8  --   --   --   --   --   --    < > = values in this interval not displayed.   Liver Function Tests: No results for input(s): "AST", "ALT", "ALKPHOS", "BILITOT", "PROT", "ALBUMIN" in the last 168 hours.  CBC: Recent Labs  Lab 02/14/23 0842 02/15/23 0027 02/16/23 0617 02/17/23 0543 02/18/23 0545  WBC 9.5 9.0 8.6 7.9 7.4  HGB 11.8* 10.4* 10.3* 10.6* 10.4*  HCT 35.4* 31.6* 30.4* 31.5* 30.6*  MCV 83.7 85.4 84.9 85.1 83.4  PLT 278 246 225 230 208   INR: Recent Labs  Lab 02/14/23 0842 02/15/23 0027 02/16/23 0617 02/17/23 0543 02/18/23 0545  INR 1.7* 1.8* 2.4* 2.4* 2.3*  Other results:  Imaging   [place] EKG SITE RITE  Result Date: 02/17/2023 If Site Rite image not attached, placement could not be confirmed due to current cardiac rhythm.   Medications:     Scheduled Medications:  allopurinol  100 mg Oral Daily   amiodarone  200 mg Oral BID   clonazepam  0.25 mg Oral BID   feeding supplement (GLUCERNA SHAKE)  237 mL Oral TID WC   glipiZIDE  5 mg Oral Q breakfast   guaiFENesin  600 mg Oral BID   insulin aspart  0-5 Units Subcutaneous QHS   insulin aspart  0-9 Units Subcutaneous TID WC   loratadine  10 mg Oral Daily   metFORMIN  500 mg Oral Q breakfast   metoprolol succinate  25 mg Oral Daily   minocycline  200 mg Oral BID   mirtazapine  15 mg Oral QHS   multivitamin with minerals  1 tablet Oral Q24H    pantoprazole  40 mg Oral Daily   sertraline  50 mg Oral Daily   tamsulosin  0.4 mg Oral Daily   thiamine  100 mg Oral Daily   traZODone  150 mg Oral QHS   Warfarin - Pharmacist Dosing Inpatient   Does not apply q1600   zinc sulfate  220 mg Oral Daily    Infusions:  DAPTOmycin (CUBICIN) 700 mg in sodium chloride 0.9 % IVPB 700 mg (02/17/23 2002)    PRN Medications: acetaminophen, colchicine, fluticasone, hydrALAZINE, ipratropium, levalbuterol, ondansetron (ZOFRAN) IV, mouth rinse, prochlorperazine  Patient Profile  62 y.o. 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 type 2 diabetes mellitus, COPD, hypertension, and HMIII VAD.    Admitted with recurrent LVAD complication driveline infection + suspected AECOPD Assessment/Plan:   1. LVAD Complication---> Recurrent driveline infection: MRSA driveline infection, admitted in 6/23 and again in 7/23-8/23 with multiple trips to the OR for debridement. He had completed vancomycin and then long-term doxycycline. Most recent culture PTA grew acinetobacter and MRSA. ID started minocycline. He had been off minocycline for at least 3 days prior to this admit w/ copious drainage around driveline. CT chest/abd/pelvis showed stranding around the driveline and small amount of fluid. Started on Vanc and Meropenum per ID. Now off meropenem (2/21).  OR for debridement w/ wound vac placement 2/20. Wound culture with MRSA, blood cultures negative.  Afebrile.  - Will need 6 wks abx. Vanc switched to dapto 2/27 with nausea. Now on dapto/minocycline. Once dapto complete continue suppressive minocycline   - Remains AF on IV abx - Returned to OR for I&D and wound vac removal 3/6. Site looks ok today - consider ramp echo with frequent BP fluctuations / low-flow alarms 2. Chronic HFrEF: Echo 10/21 with EF  20-25%, mildly decreased RV function. LHC/RHC in 12/21 with patent grafts, low output. Suspect mixed ischemic/nonischemic cardiomyopathy (prior heavy ETOH and drugs as well as CAD).  No ETOH, drugs, smoking since CABG in 12/20. Admitted with cardiogenic shock in 12/21, had placement of Impella 5.5 initially, now s/p Heartmate 3 LVAD on 11/17/20.   - Volume status/SCr improved after IVF yesterday, Avoid diuretics  - INR 2.3. Discussed dosing with PharmD personally. Adjusting warfarin dose.  - Not a candidate for transplant with ongoing drug use.  3. HTN: MAPs elevated low 100s on admit, poor compliance with meds. Has fluctuated significantly while admitted. MAPs will  elelvate in setting of emotional distress - Can continue Toprol XL 25 mg daily  - Hypotensive overnight likely due to hypovolemia. Now off losartan. S/p IVF over the weekend - Hypertensive this morning with high MAPS and low-flow on VAD. Will start 12.5 losartan at night.  4. Polysubstance Abuse: Recently started using methamphetamine again.  UDS + for amphetamines.  - SW team following. Unfortunately, limited resources for inpatient substance use due LVAD - Having paroxysms of n/v. He believes it is all due to anxiety.  - Psych saw patient 3/3, now on remeron and klonopin 5. PAF: S/p Maze and LA appendage clip with CABG in 12/20.  NSR this admission.  6. 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  Still some wheezing but has improved.   - Continue scheduled atrovent.  - Levalbuterol prn.  - Have avoided prednisone with surgery at driveline site for recurrent infection.   7. DMII - SSI  8.  NSVT/PVCs: Frequent NSVT 2/23, may have been dry after diuresis.  Amiodarone gtt started to suppress.  - Continue po amiodarone 200 mg bid.  - Would eventually stop this medication, avoid with COPD.   9. Nausea/vomiting - Began 2/27. Assumed 2/2 vancomycin initially however vanc switched to daptomycin and nausea continues.  - KUB ok - suspect etiology due to  severe emotional distress/anxiety +/- substance abuse WD  10. Major depression - Patient refusing to work with PT, not motivated and expresses frustration with his overall situation.  Ongoing drug use, no longer living with his wife.  Has been living with his mother who cannot care for him.  - Now saying that he would like his VAD to be off so that his wife and mother don't have him in common any more  - Psych consulted and saw patient, now on remeron 11. AKI - sCr 1.3>1.76>1.58>1.39; likely due to hypovolemia and hypotension. Now off losartan. Increasing PO intake 12. NSVT: No further NSVT.  - Keep K > 4.0 Mg > 2.0 13. Dispo - SNF placement pending insurance approval ([place])  I reviewed the LVAD parameters from today, and compared the results to the patient's prior recorded data.  No programming changes were made.  The LVAD is functioning within specified parameters.  The patient performs LVAD self-test daily.  LVAD interrogation was negative for any significant power changes, alarms or PI events/speed drops.  LVAD equipment check completed and is in good working order.  Back-up equipment present.   LVAD education done on emergency procedures and precautions and reviewed exit site care.  Length of Stay: [place], NP 02/18/2023, 7:25 AM  VAD Team --- VAD ISSUES ONLY--- Pager [phone] (7am - 7am)  Advanced Heart Failure Team  Pager [phone] (M-F; 7a - 5p)  Please contact [facility] for night-coverage after hours (5p -7a ) and weekends on [email or web address]  Patient seen with NP, agree with the above note.    Some nausea today, otherwise feels ok.  MAP 90s.    General: Well appearing this am. NAD.  HEENT: Normal. Neck: Supple, JVP 7-8 cm. Carotids OK.  Cardiac:  Mechanical heart sounds with LVAD hum present.  Lungs:  Distant BS.   Abdomen:  NT, ND, no HSM. No bruits or masses. +BS  LVAD exit site: Well-healed and incorporated. Dressing dry and intact. No erythema or  drainage. Stabilization device present and accurately applied. Driveline dressing changed daily per sterile technique. Extremities:  Warm and dry. No cyanosis, clubbing, rash, or edema.  Neuro:  Alert &  oriented x 3. Cranial nerves grossly intact. Moves all 4 extremities w/o difficulty. Affect pleasant    Volume status looks stable.  Will keep him off Lasix for now.  MAP acceptable, seems to rise with anxiety.    He will continue daptomycin and minocycline x 6 wks, then minocycline alone.    Plan to SNF at [facility] when bed available.   Loralie Champagne 02/18/2023 2:49 PM .

## 2023-02-19 LAB — CBC
HCT: 30 % — ABNORMAL LOW (ref 39.0–52.0)
Hemoglobin: 10.1 g/dL — ABNORMAL LOW (ref 13.0–17.0)
MCH: 28.6 pg (ref 26.0–34.0)
MCHC: 33.7 g/dL (ref 30.0–36.0)
MCV: 85 fL (ref 80.0–100.0)
Platelets: 209 10*3/uL (ref 150–400)
RBC: 3.53 MIL/uL — ABNORMAL LOW (ref 4.22–5.81)
RDW: 14.7 % (ref 11.5–15.5)
WBC: 6.9 10*3/uL (ref 4.0–10.5)
nRBC: 0 % (ref 0.0–0.2)

## 2023-02-19 LAB — BASIC METABOLIC PANEL
Anion gap: 6 (ref 5–15)
BUN: 25 mg/dL — ABNORMAL HIGH (ref 8–23)
CO2: 28 mmol/L (ref 22–32)
Calcium: 8.7 mg/dL — ABNORMAL LOW (ref 8.9–10.3)
Chloride: 104 mmol/L (ref 98–111)
Creatinine, Ser: 1.28 mg/dL — ABNORMAL HIGH (ref 0.61–1.24)
GFR, Estimated: >60 mL/min (ref >60)
Glucose, Bld: 123 mg/dL — ABNORMAL HIGH (ref 70–99)
Potassium: 4.1 mmol/L (ref 3.5–5.1)
Sodium: 138 mmol/L (ref 135–145)

## 2023-02-19 LAB — GLUCOSE, CAPILLARY
Glucose-Capillary: 128 mg/dL — ABNORMAL HIGH (ref 70–99)
Glucose-Capillary: 131 mg/dL — ABNORMAL HIGH (ref 70–99)
Glucose-Capillary: 131 mg/dL — ABNORMAL HIGH (ref 70–99)
Glucose-Capillary: 129 mg/dL — ABNORMAL HIGH (ref 70–99)

## 2023-02-19 LAB — PROTIME-INR
INR: 2.1 — ABNORMAL HIGH (ref 0.8–1.2)
Prothrombin Time: 23.7 seconds — ABNORMAL HIGH (ref 11.4–15.2)

## 2023-02-19 LAB — LACTATE DEHYDROGENASE: LDH: 145 U/L (ref 98–192)

## 2023-02-19 MED ORDER — WARFARIN SODIUM 3 MG PO TABS
6.0000 mg | ORAL_TABLET | Freq: Once | ORAL | Status: AC
  Administered 2023-02-19: 6 mg via ORAL
  Filled 2023-02-19: qty 2

## 2023-02-19 NOTE — Progress Notes (Signed)
[facility] for Infectious Disease  Date of Admission:  01/27/2023   Total days of inpatient antibiotics 8  Principal Problem:   Left ventricular assist device (LVAD) complication, subsequent encounter Active Problems:   Stimulant use disorder   Depression          Assessment: 42 YM admitted with: #Driveline infection SP debridement with Cx+ MRSA #Vancomycin intolerance -OR on 2/20 for excisional debridement of VAD tunnel noted indurated tissue, no gross purulence with Cx+ MRSA -Pt has nausea since yesterday morning that has not resolved with zofran.  He has retching while I was in the room. States he  had similar experience int he past while being on vanc(switched to dapto 02/04/23). Tolerating dapto-nausea resolved.  -s/p several washout, last one 02/12/2023. Post 2/20 debridement no purulence thus no cx sent. Previously has acinetobacter too thus on both mino and dapto. 2/20 cx only showed mrsa -no side effect so far with mino/dapto   Recommendations: -4 weeks dapto from 2/20 to 3/19, with concomittant mino -after that 4 weeks from 2/20 continue mino indefinitely -id f/u as below -discussed with primary team      OPAT Orders Discharge antibiotics to be given via PICC line Daptomycin iv Minocycline PO  Duration: Daptomycin duration 4 weeks from 2/20 Minocycline indefinitely End Date: 3/19  [facility] Care Per Protocol:  Home health RN for IV administration and teaching; PICC line care and labs.    Labs weekly while on IV antibiotics: _x_ CBC with differential __ BMP _x_ CMP _x_ CRP __ ESR __ Vancomycin trough _x_ CK  _x_ Please pull PIC at completion of IV antibiotics __ Please leave PIC in place until doctor has seen patient or been notified  Fax weekly labs to [phone]  Clinic Follow Up Appt: 4/08 @ 11am with dr Leanora Cover  @  [facility] [place], [place],  [address] Phone: [phone]   I spent more than 50  minute reviewing data/chart, and coordinating care and >50% direct face to face time providing counseling/discussing diagnostics/treatment plan with patient    Microbiology:   Antibiotics: Merrem 2/19-20 Vancomycin 2/19-2/27  Dapto 2/27-c Outpatient minocycline continued this admission   Cultures: Blood 2/19 ng Wound 2/20 mrsa    SUBJECTIVE: Doing well No n/v/diarrhea/rash No focal pain/redness at lvad exit site Pending dispo  Review of Systems: Review of Systems  All other systems reviewed and are negative.    Scheduled Meds:  allopurinol  100 mg Oral Daily   amiodarone  200 mg Oral BID   Chlorhexidine Gluconate Cloth  6 each Topical Daily   clonazepam  0.25 mg Oral BID   feeding supplement (GLUCERNA SHAKE)  237 mL Oral TID WC   glipiZIDE  5 mg Oral Q breakfast   guaiFENesin  600 mg Oral BID   insulin aspart  0-5 Units Subcutaneous QHS   insulin aspart  0-9 Units Subcutaneous TID WC   loratadine  10 mg Oral Daily   metFORMIN  500 mg Oral Q breakfast   metoprolol succinate  25 mg Oral Daily   minocycline  200 mg Oral BID   mirtazapine  15 mg Oral QHS   multivitamin with minerals  1 tablet Oral Q24H   pantoprazole  40 mg Oral Daily   sertraline  50 mg Oral Daily   sodium chloride flush  10-40 mL Intracatheter Q12H   tamsulosin  0.4 mg Oral Daily   thiamine  100 mg  Oral Daily   traZODone  150 mg Oral QHS   warfarin  6 mg Oral ONCE-1600   Warfarin - Pharmacist Dosing Inpatient   Does not apply q1600   zinc sulfate  220 mg Oral Daily   Continuous Infusions:  DAPTOmycin (CUBICIN) 700 mg in sodium chloride 0.9 % IVPB Stopped (02/18/23 2022)   PRN Meds:.acetaminophen, colchicine, fluticasone, hydrALAZINE, ipratropium, levalbuterol, ondansetron (ZOFRAN) IV, mouth rinse, prochlorperazine, sodium chloride flush Allergies  Allergen Reactions   Vancomycin Nausea And Vomiting and Other (See Comments)    Severe joint pain    OBJECTIVE: Vitals:   02/18/23 2311  02/19/23 0522 02/19/23 0746 02/19/23 0750  BP: 106/77 91/68 100/88 100/88  Pulse: 88 77    Resp: 20 14    Temp: 98.3 F (36.8 C) 98 F (36.7 C)    TempSrc: Oral Oral    SpO2: 94%     Weight:  84.6 kg    Height:       Body mass index is 26.76 kg/m.  Physical Exam Constitutional:      General: He is not in acute distress.    Appearance: He is normal weight. He is not toxic-appearing.  HENT:     Head: Normocephalic and atraumatic.     Right Ear: External ear normal.     Left Ear: External ear normal.     Nose: No congestion or rhinorrhea.     Mouth/Throat:     Mouth: Mucous membranes are moist.     Pharynx: Oropharynx is clear.  Eyes:     Extraocular Movements: Extraocular movements intact.     Conjunctiva/sclera: Conjunctivae normal.     Pupils: Pupils are equal, round, and reactive to light.  Cardiovascular:     Rate and Rhythm: Normal rate and regular rhythm.     Heart sounds: No murmur heard.    No friction rub. No gallop.  Pulmonary:     Effort: Pulmonary effort is normal.     Breath sounds: Normal breath sounds.  Abdominal:     General: Abdomen is flat. Bowel sounds are normal.     Palpations: Abdomen is soft.  Musculoskeletal:        General: No swelling. Normal range of motion.     Cervical back: Normal range of motion and neck supple.  Skin:    General: Skin is warm and dry.     Comments: Lvad exit site dressing clean; no surrounding erythema; nontender and no fluctuance   Abd wall diastasis around wound site  RUE picc site no erythema/tenderness  Neurological:     General: No focal deficit present.     Mental Status: He is oriented to person, place, and time.  Psychiatric:        Mood and Affect: Mood normal.       Lab Results Lab Results  Component Value Date   WBC 6.9 02/19/2023   HGB 10.1 (L) 02/19/2023   HCT 30.0 (L) 02/19/2023   MCV 85.0 02/19/2023   PLT 209 02/19/2023    Lab Results  Component Value Date   CREATININE 1.28 (H)  02/19/2023   BUN 25 (H) 02/19/2023   NA 138 02/19/2023   K 4.1 02/19/2023   CL 104 02/19/2023   CO2 28 02/19/2023    Lab Results  Component Value Date   ALT 17 01/27/2023   AST 18 01/27/2023   ALKPHOS 101 01/27/2023   BILITOT 0.5 01/27/2023        Jabier Mutton, MD [facility] 02/19/2023, 2:00 PM

## 2023-02-19 NOTE — Progress Notes (Signed)
7 Days Post-Op Procedure(s) (LRB): ABDOMINAL DRIVELINE WOUND DEBRIDEMENT AND APPLICATION OF MATRIX WOUND DRESSING (N/A) REMOVAL OF WOUND VAC DRESSING (N/A) Subjective: Patient examined and VAD tunnel wound personally examined and sterile dressing reapplied.  Patient is feeling much better today, better appetite, walking without difficulty.  VAD hemodynamic parameters are satisfactory with VAD flow 4.2 L/min.  Under sterile technique the VAD tunnel dressing was changed.  There is no purulence.  There is some generation of clean granulation tissue.  The short tunnel was packed with 2 x 2 gauze wet-to-dry with hypochlorous acid wound solution.  Dry dressing applied over the wound covered with a Tegaderm.  The wound is now satisfactory for care as outpatient with IV antibiotic through the PICC line.  Objective: Vital signs in last 24 hours: Temp:  [97.5 F (36.4 C)-98.3 F (36.8 C)] 98 F (36.7 C) (03/13 0522) Pulse Rate:  [77-97] 77 (03/13 0522) Cardiac Rhythm: Normal sinus rhythm (03/13 0800) Resp:  [14-20] 14 (03/13 0522) BP: (72-123)/(55-88) 100/88 (03/13 0750) SpO2:  [94 %-96 %] 94 % (03/12 2311) Weight:  [84.6 kg] 84.6 kg (03/13 0522)  Hemodynamic parameters for last 24 hours:  Sinus rhythm, febrile  Intake/Output from previous day: 03/12 0701 - 03/13 0700 In: 304 [P.O.:240; IV Piggyback:64] Out: 200 [Urine:200] Intake/Output this shift: No intake/output data recorded.       Exam    General- alert and comfortable.  Wound dressing clean and dry after change.    Neck- no JVD, no cervical adenopathy palpable, no carotid bruit   Lungs- clear without rales, wheezes   Cor- regular rate and rhythm, normal VAD hum   Abdomen- soft, non-tender   Extremities - warm, non-tender, minimal edema   Neuro- oriented, appropriate, no focal weakness   Lab Results: Recent Labs    02/18/23 0545 02/19/23 0525  WBC 7.4 6.9  HGB 10.4* 10.1*  HCT 30.6* 30.0*  PLT 208 209   BMET:   Recent Labs    02/18/23 0545 02/19/23 0525  NA 136 138  K 4.2 4.1  CL 103 104  CO2 23 28  GLUCOSE 125* 123*  BUN 28* 25*  CREATININE 1.39* 1.28*  CALCIUM 8.9 8.7*    PT/INR:  Recent Labs    02/19/23 0525  LABPROT 23.7*  INR 2.1*   ABG    Component Value Date/Time   PHART 7.508 (H) 11/20/2020 0438   HCO3 24.8 09/09/2021 0313   TCO2 29 11/20/2020 0438   ACIDBASEDEF 6.0 (H) 11/24/2019 1718   O2SAT 63.4 09/09/2021 0313   CBG (last 3)  Recent Labs    02/18/23 1705 02/18/23 2107 02/19/23 0627  GLUCAP 118* 175* 128*    Assessment/Plan: S/P Procedure(s) (LRB): ABDOMINAL DRIVELINE WOUND DEBRIDEMENT AND APPLICATION OF MATRIX WOUND DRESSING (N/A) REMOVAL OF WOUND VAC DRESSING (N/A) Continue daily dressing changes with wet-to-dry hypochlorous acid wound solution and daily IV antibiotics.  Patient ready for transfer to skilled nursing facility.   LOS: 23 days    [name] 02/19/2023

## 2023-02-19 NOTE — Progress Notes (Signed)
SNF auth status still currently pending. TOC will continue to follow.

## 2023-02-19 NOTE — Progress Notes (Signed)
PT Cancellation Note  Patient Details Name: Kevin Underwood. MRN: [id number] DOB: 11-Jan-1961   Cancelled Treatment:    Reason Eval/Treat Not Completed: PT screened, no needs identified, will sign off (pt witnessed to perform transfers and long hall ambulation independently with mobility specialist and does not require therapy services at this time. Will screen)   Treyvon Blahut B Calisha Tindel 02/19/2023, 10:17 AM Bayard Males, PT Acute Rehabilitation Services Office: [phone]

## 2023-02-19 NOTE — Progress Notes (Addendum)
LVAD Coordinator Rounding Note:  Admitted 01/27/23 to Heart Failure service from ED due to driveline infection. Started on IV antibiotics.   S/p driveline debridement in OR 2/21,2/28 and 3/7. Pt now having daily dressing changes with Vashe solution.  VAD coordinator paged overnight for MAP 60, and pt feeling poorly. Received 250 cc bolus. Losartan 12.5 mg q HS discontinued at that time prior to first dose.  BP stable this morning- 100/88 (94). Hct dropped to 20 on VAD to help decrease possible low flow alarms. Pt states he feels great this morning. He is excited to hopefully discharge soon to SNF to complete IV antibiotics. Pt's wife [name] is at bedside this morning dropping off pt's clothing.  Pt will need home IV antibiotics at discharge with a stop date of 03/11/23. Pt will need placement in SNF for IV antibiotics at discharge as he is living with his mom and she is unable to help him with care/medication administration. [facility] has accepted pt. [facility] admissions coordinator is aware of pts drug history. VAD coordinator reached out to Selinda Eon to obtain PA for pts [employer]. Authorization pending.   Controller electively exchanged this admission. No further low voltage hazard alarms seen on interrogation.  Discussed follow up appt with Dr Aundra Dubin- appt scheduled 03/10/23 at 11:00 in [place] clinic. VAD coordinator will plan to deliver Presenter, broadcasting, MPU, and VAD education notebook to [facility]. Pt will discharge with loaner emergency bag as his backup equipment is at home. [name] will plan to deliver pt's back up bag at some point while he is at [facility]. VAD coordinator will deliver dressing supplies (25 daily dressings, 15 anchors, 4 x 4s, 2 x 2s, cotton tip applicators, large tegaderms, and large bottle of VASHE solution to [facility] for facility use for daily dressing changes.   Reached out to ID team (Dr Gale Journey) for follow up appt and OPAT orders per Dr  Keturah Barre note 02/07/23.   Vital signs: Temp:  98.0 HR: 96 Doppler Pressure: 86 Automatic BP: 100/88 (94) O2 Sat: 95% on RA Wt:192.9>197>195.9>190.9>187.6>188.9>184.1>189.6>182.3>181.8>182.3>183.4>191.6>184.5>188.2>188.4>186.5 lbs  LVAD interrogation reveals:  Speed: 5600 Flow: 4.8 Power: 4.0 w PI: 2.5 Alarms: none Events: >100 Hct: 20 (DO NOT ADJUST)  Fixed speed:  5600 Low speed limit:  5300  Drive Line:  Existing VAD dressing removed and site care performed using sterile technique by DR Darcey Nora. VASHE solution to wound bed. Debride with dry 2 x 2. Drive line exit site cleaned with Chlora prep applicators x 2, allowed to dry. Rylon sheet present in wound bed. Vashe soaked 2x2 gauze placed in would bed, and covered with dry gauze. Entire dressing covered with large tegaderm. Moderate amount of serosanguineous drainage noted on previous dressing.  No redness, foul odor, or tenderness noted. Drive line anchor intact. Daily sterile wet to dry dressing changes per bedside RN or VAD coordinator. Next dressing change in tomorrow 02/20/23 by VAD coordinator.     Labs:  LDH trend: 203>187>142>145>139>161>155>148>152>149>166>151>156>158>166>161>145  INR trend: 1.1>1.1>1.4>1.9>2.2>2.4>1.8>1.9>2.2>2.5>2.4>1.7>2.4>2.3>2.1  Anticoagulation Plan: -INR Goal: 2.0 - 2.5 -ASA Dose: none -Heparin 500 u/hr -off  ICD: N/A  Infection:  01/27/23>> blood cultures>> no growth final 01/27/23>>driveline culture>>FEW METHICILLIN RESISTANT STAPHYLOCOCCUS AUREUS  01/28/23>>driveline cx from OR>>FEW METHICILLIN RESISTANT STAPHYLOCOCCUS AUREUS; final  Plan/Recommendations:  Page VAD coordinator with equipment issues or driveline problems Continue daily dressing changes by bedside nurse or VAD Coordinator.  Emerson Monte RN [place] Coordinator  Office: [phone]  24/7 Pager: [phone]

## 2023-02-19 NOTE — Progress Notes (Signed)
   02/18/23 1935  Assess: MEWS Score  Temp (!) 97.5 F (36.4 C)  BP (!) 72/55  MAP (mmHg) (!) 60  Pulse Rate 97  ECG Heart Rate 97  Resp 14  Level of Consciousness Alert  SpO2 96 %  O2 Device Room Air  Assess: MEWS Score  MEWS Temp 0  MEWS Systolic 2  MEWS Pulse 0  MEWS RR 0  MEWS LOC 0  MEWS Score 2  MEWS Score Color Yellow  Assess: if the MEWS score is Yellow or Red  Were vital signs taken at a resting state? Yes  Focused Assessment No change from prior assessment  Does the patient meet 2 or more of the SIRS criteria? Yes  Does the patient have a confirmed or suspected source of infection? Yes  Provider and Rapid Response Notified? Yes  MEWS guidelines implemented  No, previously yellow, continue vital signs every 4 hours  Provider Notification  Provider Name/Title Clarise Cruz LVAD  Date Provider Notified 02/18/23  Time Provider Notified 1935  Method of Notification Call  Notification Reason Change in status  Provider response See new orders  Date of Provider Response 02/18/23  Time of Provider Response 1935  Assess: SIRS CRITERIA  SIRS Temperature  0  SIRS Pulse 1  SIRS Respirations  0  SIRS WBC 0  SIRS Score Sum  1

## 2023-02-19 NOTE — Progress Notes (Signed)
[place] for warfarin  Indication: LVAD  Allergies  Allergen Reactions   Vancomycin Nausea And Vomiting and Other (See Comments)    Severe joint pain    Patient Measurements: Height: '5\' 10"'$  (177.8 cm) Weight: 84.6 kg (186 lb 8.2 oz) IBW/kg (Calculated) : 73   Vital Signs: Temp: 98 F (36.7 C) (03/13 0522) Temp Source: Oral (03/13 0522) BP: 91/68 (03/13 0522) Pulse Rate: 77 (03/13 0522)  Labs: Recent Labs    02/17/23 0543 02/18/23 0545 02/19/23 0525  HGB 10.6* 10.4* 10.1*  HCT 31.5* 30.6* 30.0*  PLT 230 208 209  LABPROT 26.0* 25.4* 23.7*  INR 2.4* 2.3* 2.1*  CREATININE 1.58* 1.39* 1.28*  CKTOTAL  --  57  --      Estimated Creatinine Clearance: 61.8 mL/min (A) (by C-G formula based on SCr of 1.28 mg/dL (H)).   Medical History: Past Medical History:  Diagnosis Date   "    Arthritis    CAD (coronary artery disease)    a. s/p CABG in 11/2019 with LIMA-LAD, SVG-OM1, SVG-PDA and SVG-D1   CHF (congestive heart failure) (HCC)    a. EF < 20% by echo in 11/2019   Essential hypertension    PAF (paroxysmal atrial fibrillation) (HCC)    Type 2 diabetes mellitus (HCC)     Medications:   DAPTOmycin (CUBICIN) 700 mg in sodium chloride 0.9 % IVPB Stopped (02/18/23 2022)    Assessment: 62 yo male on chronic warfarin for LVAD, poor compliance with outpatient checks. INR on admission 1.1. S/p OR 2/20 for drive line infection debridement. Previous warfarin dose 6 mg daily except 4 mg on Tues, Thurs.  INR today is therapeutic at 2.1, CBC and LDH stable.  Goal of Therapy:  INR 2-2.4 (keeping low in anticipation of return to OR soon) Monitor platelets by anticoagulation protocol: Yes   Plan:  Warfarin '6mg'$  PO x1 tonight Daily INR  Arrie Senate, PharmD, BCPS, [facility] Clinical Pharmacist [phone] Please check AMION for all [facility] numbers 02/19/2023

## 2023-02-19 NOTE — Progress Notes (Signed)
   02/18/23 1935  Type of VAD  VAD Type HeartMate 3  HeartMate 3 Drive Line Site Care  Site Assessment Swollen / red  Dressing Status Clean, dry, intact  Anchor Status Clean, dry, intact;Applied correctly  Dressing Change Due 02/19/23  Modular Connection Visual Check No Yellow Line  HeartMate 3 VAD Equipment Check  Pump Speed (RPM) 5600 RPM  Pump Flow (LPM) 4.5  Power (Watts) 4 Watts  Pulsatility Index 2.6  Fixed Speed Limit 5600 rpm  Low Speed Limit 5300 rpm  Alarms No alarms  Auscultated Normal expected humming  Patient Battery Source [facility] / Wall unit  Emergency Equipment at Bedside Yes  Patient Hemodynamics  BP (!) 72/55  MAP (mmHg) (!) 60  Doppler Pressure 70  BP Method Manual  O2 Sat Reading Accurately Yes - Heart rate tracking accurately  Capillary Refill Less than 3 seconds  Color Appropriate for ethnicity  Temperature/Moisture Warm;Dry

## 2023-02-19 NOTE — Progress Notes (Signed)
Mobility Specialist Progress Note:   02/19/23 1030  Mobility  Activity Ambulated with assistance in hallway  Level of Assistance Independent  Assistive Device None  Distance Ambulated (ft) 400 ft  Activity Response Tolerated well  $Mobility charge 1 Mobility   Pt in chair willing to participate in mobility. No complaints of pain. Left in chair with call bell in reach and all needs met.   Gareth Eagle Yandel Zeiner Mobility Specialist Please contact via [employer] or  Rehab Office at [phone]

## 2023-02-19 NOTE — TOC Progression Note (Signed)
Transition of Care (TOC) - Progression Note    Patient Details  Name: Kevin Underwood. MRN: [id number] Date of Birth: December 28, 1960  Transition of Care [facility]) CM/SW [place], [place] Phone Number: 02/19/2023, 4:03 PM  Clinical Narrative:     Josem Kaufmann approval received, 02/19/2023-02/21/2023, [id number]  Discussed with Kizzy([place] Admissions) at [phone]. Will plan for DC tomorrow.   Expected Discharge Plan: [place]   Expected Discharge Plan and Services   Discharge Planning Services: CM Consult Post Acute Care Choice: [place] Living arrangements for the past 2 months: [place]: RN           Social Determinants of Health (SDOH) Interventions SDOH Screenings   Food Insecurity: No Food Insecurity (02/05/2023)  Housing: Low Risk  (02/05/2023)  Transportation Needs: Unmet Transportation Needs (02/05/2023)  Utilities: Not At Risk (02/05/2023)  Alcohol Screen: Low Risk  (11/10/2020)  Depression (PHQ2-9): Low Risk  (10/16/2022)  Financial Resource Strain: Medium Risk (05/17/2022)  Physical Activity: Inactive (11/10/2020)  Tobacco Use: Medium Risk (02/13/2023)    Readmission Risk Interventions    07/01/2022    9:05 AM 11/28/2020   11:57 AM  Readmission Risk Prevention Plan  Transportation Screening Complete Complete  Medication Review ([place]) Complete Complete  PCP or Specialist appointment within 3-5 days of discharge Complete   HRI or [place] Complete Complete  SW Recovery Care/Counseling Consult Complete Complete  Palliative Care Screening Not Applicable Not [place] Not Applicable Not Applicable

## 2023-02-19 NOTE — Progress Notes (Addendum)
PHARMACY CONSULT NOTE FOR:  OUTPATIENT  PARENTERAL ANTIBIOTIC THERAPY (OPAT)  Indication:  MRSA Driveline infection  Regimen:  Daptomycin 700 mg every 24 hours  End date: 03/11/23  Continue minocycline 200 mg every 12 hours indefinitely till follow-up   IV antibiotic discharge orders are pended. To discharging provider:  please sign these orders via discharge navigator,  Select New Orders & click on the button choice - Manage This Unsigned Work.     Thank you for allowing pharmacy to be a part of this patient's care.  Jimmy Footman, PharmD, BCPS, [place] [facility] Clinical Pharmacist Phone: [phone] 02/19/2023, 2:12 PM

## 2023-02-19 NOTE — Progress Notes (Addendum)
Patient ID: Kevin Underwood., male   DOB: January 10, 1961, 62 y.o.   MRN: [id number]   Advanced Heart Failure VAD Team Note  PCP-Cardiologist: Rozann Lesches, MD  AHF: Dr. Aundra Dubin   Subjective:   -Driveline site infection.  Wound Cx with MRSA. On IV vancomycin.  OR for debridement w/ wound vac placement 2/20 and 2/28. -Psych consulted and saw patient 3/3 for suicidal ideation/depression. Patient now on Remeron.  -Underwent I&D with wound vac removal 3/6   Denies SOB  LVAD INTERROGATION:  HeartMate III LVAD:   Flow 4.6  liters/min, speed 5600, power 4, PI 3.5  80 s   Objective:    Vital Signs:   Temp:  [97.5 F (36.4 C)-98.3 F (36.8 C)] 98 F (36.7 C) (03/13 0522) Pulse Rate:  [77-97] 77 (03/13 0522) Resp:  [14-20] 14 (03/13 0522) BP: (72-164)/(55-106) 100/88 (03/13 0746) SpO2:  [94 %-96 %] 94 % (03/12 2311) Weight:  [84.6 kg] 84.6 kg (03/13 0522) Last BM Date : 02/18/23 Mean arterial Pressure 80s   Intake/Output:   Intake/Output Summary (Last 24 hours) at 02/19/2023 0808 Last data filed at 02/19/2023 [id number] Gross per 24 hour  Intake 304 ml  Output 200 ml  Net 104 ml    Physical Exam   Physical Exam: GENERAL: No acute distress. Sitting in the chair.  HEENT: normal  NECK: Supple, JVP  5-6.  2+ bilaterally, no bruits.  No lymphadenopathy or thyromegaly appreciated.   CARDIAC:  Mechanical heart sounds with LVAD hum present.  LUNGS:  Clear to auscultation bilaterally.  ABDOMEN:  Soft, round, nontender, positive bowel sounds x4.    + hernia  LVAD exit site: Dressing dry and intact.  No erythema or drainage.  Stabilization device present and accurately applied.  Driveline dressing is being changed daily per sterile technique. EXTREMITIES:  Warm and dry, no cyanosis, clubbing, rash or edema . RUE PICC  NEUROLOGIC:  Alert and oriented x 3.    No aphasia.  No dysarthria.  Affect pleasant.     Telemetry  NSR 80s   Labs   Basic Metabolic Panel: Recent Labs  Lab  02/15/23 0027 02/16/23 0617 02/17/23 0543 02/18/23 0545 02/19/23 0525  NA 137 135 136 136 138  K 4.1 3.8 4.0 4.2 4.1  CL 106 101 103 103 104  CO2 '24 22 22 23 28  '$ GLUCOSE 133* 167* 172* 125* 123*  BUN 23 21 25* 28* 25*  CREATININE 1.30* 1.76* 1.58* 1.39* 1.28*  CALCIUM 8.7* 8.7* 8.8* 8.9 8.7*   Liver Function Tests: No results for input(s): "AST", "ALT", "ALKPHOS", "BILITOT", "PROT", "ALBUMIN" in the last 168 hours.  CBC: Recent Labs  Lab 02/15/23 0027 02/16/23 0617 02/17/23 0543 02/18/23 0545 02/19/23 0525  WBC 9.0 8.6 7.9 7.4 6.9  HGB 10.4* 10.3* 10.6* 10.4* 10.1*  HCT 31.6* 30.4* 31.5* 30.6* 30.0*  MCV 85.4 84.9 85.1 83.4 85.0  PLT 246 225 230 208 209   INR: Recent Labs  Lab 02/15/23 0027 02/16/23 0617 02/17/23 0543 02/18/23 0545 02/19/23 0525  INR 1.8* 2.4* 2.4* 2.3* 2.1*    Other results:  Imaging   [place] EKG SITE RITE  Result Date: 02/17/2023 If Site Rite image not attached, placement could not be confirmed due to current cardiac rhythm.   Medications:     Scheduled Medications:  allopurinol  100 mg Oral Daily   amiodarone  200 mg Oral BID   Chlorhexidine Gluconate Cloth  6 each Topical Daily   clonazepam  0.25 mg  Oral BID   feeding supplement (GLUCERNA SHAKE)  237 mL Oral TID WC   glipiZIDE  5 mg Oral Q breakfast   guaiFENesin  600 mg Oral BID   insulin aspart  0-5 Units Subcutaneous QHS   insulin aspart  0-9 Units Subcutaneous TID WC   loratadine  10 mg Oral Daily   metFORMIN  500 mg Oral Q breakfast   metoprolol succinate  25 mg Oral Daily   minocycline  200 mg Oral BID   mirtazapine  15 mg Oral QHS   multivitamin with minerals  1 tablet Oral Q24H   pantoprazole  40 mg Oral Daily   sertraline  50 mg Oral Daily   sodium chloride flush  10-40 mL Intracatheter Q12H   tamsulosin  0.4 mg Oral Daily   thiamine  100 mg Oral Daily   traZODone  150 mg Oral QHS   warfarin  6 mg Oral ONCE-1600   Warfarin - Pharmacist Dosing Inpatient   Does not  apply q1600   zinc sulfate  220 mg Oral Daily    Infusions:  DAPTOmycin (CUBICIN) 700 mg in sodium chloride 0.9 % IVPB Stopped (02/18/23 2022)    PRN Medications: acetaminophen, colchicine, fluticasone, hydrALAZINE, ipratropium, levalbuterol, ondansetron (ZOFRAN) IV, mouth rinse, prochlorperazine, sodium chloride flush  Patient Profile  62 y.o. 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 type 2 diabetes mellitus, COPD, hypertension, and HMIII VAD.    Admitted with recurrent LVAD complication driveline infection + suspected AECOPD Assessment/Plan:   1. LVAD Complication---> Recurrent driveline infection: MRSA driveline infection, admitted in 6/23 and again in 7/23-8/23 with multiple trips to the OR for debridement. He had completed vancomycin and then long-term doxycycline. Most recent culture PTA grew acinetobacter and MRSA. ID started minocycline. He had been off minocycline for at least 3 days prior to this admit w/ copious drainage around driveline. CT chest/abd/pelvis showed stranding around the driveline and small amount of fluid. Started on Vanc and Meropenum per ID. Now off meropenem (2/21).  OR for debridement w/ wound vac placement 2/20. Wound culture with MRSA, blood cultures negative.  Afebrile.  - Will need 6 wks abx. Vanc switched to dapto 2/27 with nausea. Now on dapto/minocycline. Once dapto complete continue suppressive minocycline   - Remains AF on IV abx - Returned to OR for I&D and wound vac removal 3/6. Site looks ok today 2. Chronic HFrEF: Echo 10/21 with EF 20-25%, mildly decreased RV function. LHC/RHC in 12/21 with patent grafts, low output. Suspect mixed ischemic/nonischemic cardiomyopathy (prior heavy ETOH and drugs as well as CAD).  No ETOH, drugs, smoking since CABG in 12/20. Admitted with cardiogenic shock  in 12/21, had placement of Impella 5.5 initially, now s/p Heartmate 3 LVAD on 11/17/20.   - Volume status stable. Avoid diuretics  - INR 2.1  . Discussed dosing with PharmD personally. Adjusting warfarin dose.  - Not a candidate for transplant with ongoing drug use.  3. HTN: MAPs stable today.  Can continue Toprol XL 25 mg daily  4. . Polysubstance Abuse: Recently started using methamphetamine again.  UDS + for amphetamines.  - SW team following. Unfortunately, limited resources for inpatient substance use due LVAD - Having paroxysms of n/v. He believes it is all due to anxiety.  - Psych saw patient 3/3, now on remeron and klonopin 5. PAF: S/p Maze and  LA appendage clip with CABG in 12/20.  NSR this admission.  6. 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  Slight wheezw .   - Continue scheduled atrovent.  - Levalbuterol prn.  - Have avoided prednisone with surgery at driveline site for recurrent infection.   7. DMII - SSI  8.  NSVT/PVCs: Frequent NSVT 2/23, may have been dry after diuresis.  Amiodarone gtt started to suppress.  - Continue po amiodarone 200 mg bid.  - Would eventually stop this medication, avoid with COPD.   9. Nausea/vomiting - Began 2/27. Assumed 2/2 vancomycin initially however vanc switched to daptomycin and nausea continues.  - KUB ok - suspect etiology due to severe emotional distress/anxiety +/- substance abuse WD  10. Major depression - Patient refusing to work with PT, not motivated and expresses frustration with his overall situation.  Ongoing drug use, no longer living with his wife.  Has been living with his mother who cannot care for him.  - Now saying that he would like his VAD to be off so that his wife and mother don't have him in common any more  - Psych consulted and saw patient, now on remeron 11. AKI - sCr Resolved. Stable today.  Now off losartan. Increasing PO intake 12. NSVT: No further NSVT.  - Keep K > 4.0 Mg > 2.0 13. Dispo - SNF placement pending insurance  approval ([place])  I reviewed the LVAD parameters from today, and compared the results to the patient's prior recorded data.  No programming changes were made.  The LVAD is functioning within specified parameters.  The patient performs LVAD self-test daily.  LVAD interrogation was negative for any significant power changes, alarms or PI events/speed drops.  LVAD equipment check completed and is in good working order.  Back-up equipment present.   LVAD education done on emergency procedures and precautions and reviewed exit site care.  Length of Stay: [place], NP 02/19/2023, 8:08 AM  VAD Team --- VAD ISSUES ONLY--- Pager [phone] (7am - 7am)  Advanced Heart Failure Team  Pager [phone] (M-F; 7a - 5p)  Please contact [facility] for night-coverage after hours (5p -7a ) and weekends on [email or web address] 8:08 AM .  Patient seen with NP, agree with the above note.   Breathing ok today, MAP stable.  No complaints.   General: Well appearing this am. NAD.  HEENT: Normal. Neck: Supple, JVP 7-8 cm. Carotids OK.  Cardiac:  Mechanical heart sounds with LVAD hum present.  Lungs:  CTAB, normal effort.  Abdomen:  NT, ND, no HSM. No bruits or masses. +BS  LVAD exit site: Well-healed and incorporated. Dressing dry and intact. No erythema or drainage. Stabilization device present and accurately applied. Driveline dressing changed daily per sterile technique. Extremities:  Warm and dry. No cyanosis, clubbing, rash, or edema.  Neuro:  Alert & oriented x 3. Cranial nerves grossly intact. Moves all 4 extremities w/o difficulty. Affect pleasant    He will remain on daptomycin/minocycline for driveline infection to 3/19 then minocycline alone long-term.   Waiting for SNF approval, ready for SNF when there is a bed.   Loralie Champagne 02/19/2023 2:44 PM

## 2023-02-20 ENCOUNTER — Other Ambulatory Visit (HOSPITAL_COMMUNITY): Payer: Self-pay | Admitting: Unknown Physician Specialty

## 2023-02-20 ENCOUNTER — Encounter (HOSPITAL_COMMUNITY): Payer: Self-pay | Admitting: Unknown Physician Specialty

## 2023-02-20 LAB — BASIC METABOLIC PANEL
Anion gap: 8 (ref 5–15)
BUN: 23 mg/dL (ref 8–23)
CO2: 26 mmol/L (ref 22–32)
Calcium: 8.9 mg/dL (ref 8.9–10.3)
Chloride: 102 mmol/L (ref 98–111)
Creatinine, Ser: 1.23 mg/dL (ref 0.61–1.24)
GFR, Estimated: >60 mL/min (ref >60)
Glucose, Bld: 125 mg/dL — ABNORMAL HIGH (ref 70–99)
Potassium: 4.2 mmol/L (ref 3.5–5.1)
Sodium: 136 mmol/L (ref 135–145)

## 2023-02-20 LAB — LACTATE DEHYDROGENASE: LDH: 150 U/L (ref 98–192)

## 2023-02-20 LAB — CBC
HCT: 30.5 % — ABNORMAL LOW (ref 39.0–52.0)
Hemoglobin: 10.3 g/dL — ABNORMAL LOW (ref 13.0–17.0)
MCH: 28.9 pg (ref 26.0–34.0)
MCHC: 33.8 g/dL (ref 30.0–36.0)
MCV: 85.7 fL (ref 80.0–100.0)
Platelets: 200 10*3/uL (ref 150–400)
RBC: 3.56 MIL/uL — ABNORMAL LOW (ref 4.22–5.81)
RDW: 14.6 % (ref 11.5–15.5)
WBC: 6.7 10*3/uL (ref 4.0–10.5)
nRBC: 0 % (ref 0.0–0.2)

## 2023-02-20 LAB — PROTIME-INR
INR: 2.2 — ABNORMAL HIGH (ref 0.8–1.2)
Prothrombin Time: 24.5 seconds — ABNORMAL HIGH (ref 11.4–15.2)

## 2023-02-20 LAB — GLUCOSE, CAPILLARY: Glucose-Capillary: 108 mg/dL — ABNORMAL HIGH (ref 70–99)

## 2023-02-20 MED ORDER — WARFARIN SODIUM 4 MG PO TABS: 4.0000 mg | ORAL_TABLET | Freq: Once | ORAL | Status: DC

## 2023-02-20 MED ORDER — ADULT MULTIVITAMIN W/MINERALS CH: 1.0000 | ORAL_TABLET | Freq: Every day | ORAL | Status: DC

## 2023-02-20 MED ORDER — GLUCERNA SHAKE PO LIQD: 237.0000 mL | Freq: Three times a day (TID) | ORAL | 0 refills | Status: DC

## 2023-02-20 MED ORDER — WARFARIN SODIUM 4 MG PO TABS: 4.0000 mg | ORAL_TABLET | ORAL | Status: DC

## 2023-02-20 MED ORDER — HEPARIN SOD (PORK) LOCK FLUSH 100 UNIT/ML IV SOLN
250.0000 [IU] | INTRAVENOUS | Status: AC | PRN
  Administered 2023-02-20: 250 [IU]

## 2023-02-20 MED ORDER — MIRTAZAPINE 15 MG PO TABS: 15.0000 mg | ORAL_TABLET | Freq: Every day | ORAL | Status: DC

## 2023-02-20 MED ORDER — LORATADINE 10 MG PO TABS: 10.0000 mg | ORAL_TABLET | Freq: Every day | ORAL | 6 refills | Status: DC

## 2023-02-20 MED ORDER — ZINC SULFATE 220 (50 ZN) MG PO CAPS: 220.0000 mg | ORAL_CAPSULE | Freq: Every day | ORAL | Status: DC

## 2023-02-20 MED ORDER — ACETAMINOPHEN 325 MG PO TABS: 650.0000 mg | ORAL_TABLET | ORAL | Status: DC | PRN

## 2023-02-20 MED ORDER — VITAMIN B-1 100 MG PO TABS: 100.0000 mg | ORAL_TABLET | Freq: Every day | ORAL | Status: DC

## 2023-02-20 MED ORDER — DAPTOMYCIN IV (FOR PTA / DISCHARGE USE ONLY): 700.0000 mg | INTRAVENOUS | 0 refills | Status: DC

## 2023-02-20 MED ORDER — AMIODARONE HCL 200 MG PO TABS: 200.0000 mg | ORAL_TABLET | Freq: Two times a day (BID) | ORAL | Status: DC

## 2023-02-20 MED ORDER — CLONAZEPAM 0.25 MG PO TBDP: 0.2500 mg | ORAL_TABLET | Freq: Two times a day (BID) | ORAL | 1 refills | Status: DC

## 2023-02-20 MED ORDER — CLONAZEPAM 0.25 MG PO TBDP: 0.2500 mg | ORAL_TABLET | Freq: Two times a day (BID) | ORAL | 0 refills | Status: DC

## 2023-02-20 MED ORDER — COLCHICINE 0.6 MG PO TABS: 0.6000 mg | ORAL_TABLET | Freq: Every day | ORAL | Status: DC | PRN

## 2023-02-20 NOTE — Plan of Care (Signed)
  Problem: Education: Goal: Patient will understand all VAD equipment and how it functions Outcome: Progressing Goal: Patient will be able to verbalize current INR target range and antiplatelet therapy for discharge home Outcome: Progressing   Problem: Cardiac: Goal: LVAD will function as expected and patient will experience no clinical alarms Outcome: Progressing   Problem: Education: Goal: Knowledge of General Education information will improve Description: Including pain rating scale, medication(s)/side effects and non-pharmacologic comfort measures Outcome: Progressing   Problem: Health Behavior/Discharge Planning: Goal: Ability to manage health-related needs will improve Outcome: Progressing   Problem: Clinical Measurements: Goal: Ability to maintain clinical measurements within normal limits will improve Outcome: Progressing Goal: Will remain free from infection Outcome: Progressing Goal: Diagnostic test results will improve Outcome: Progressing Goal: Respiratory complications will improve Outcome: Progressing Goal: Cardiovascular complication will be avoided Outcome: Progressing   Problem: Activity: Goal: Risk for activity intolerance will decrease Outcome: Progressing   Problem: Nutrition: Goal: Adequate nutrition will be maintained Outcome: Progressing   Problem: Coping: Goal: Level of anxiety will decrease Outcome: Progressing   Problem: Elimination: Goal: Will not experience complications related to bowel motility Outcome: Progressing Goal: Will not experience complications related to urinary retention Outcome: Progressing   Problem: Pain Managment: Goal: General experience of comfort will improve Outcome: Progressing   Problem: Safety: Goal: Ability to remain free from injury will improve Outcome: Progressing   Problem: Skin Integrity: Goal: Risk for impaired skin integrity will decrease Outcome: Progressing   

## 2023-02-20 NOTE — Progress Notes (Addendum)
Patient ID: Kevin Millers., male   DOB: 14-Sep-1961, 62 y.o.   MRN: [id number]   Advanced Heart Failure VAD Team Note  PCP-Cardiologist: Rozann Lesches, MD  AHF: Dr. Aundra Dubin   Subjective:   -Driveline site infection.  Wound Cx with MRSA. On IV vancomycin.  OR for debridement w/ wound vac placement 2/20 and 2/28. -Psych consulted and saw patient 3/3 for suicidal ideation/depression. Patient now on Remeron.  -Underwent I&D with wound vac removal 3/6  Feels good this morning, denies CP/SOB. Sitting in chair.   LVAD INTERROGATION:  HeartMate III LVAD:   Flow 4.7  liters/min, speed 5600, power 4.1, PI 2.8 79 PI events  Objective:    Vital Signs:   Temp:  [97.8 F (36.6 C)-98.2 F (36.8 C)] 97.8 F (36.6 C) (03/14 0536) Pulse Rate:  [67-83] 79 (03/14 0536) Resp:  [14-20] 20 (03/14 0536) BP: (93-119)/(76-97) 119/97 (03/14 0536) SpO2:  [92 %-98 %] 98 % (03/14 0536) Weight:  [86 kg] 86 kg (03/14 0540) Last BM Date : 02/18/23 Mean arterial Pressure 100-110s  Intake/Output:   Intake/Output Summary (Last 24 hours) at 02/20/2023 0749 Last data filed at 02/20/2023 0000 Gross per 24 hour  Intake 484 ml  Output 0 ml  Net 484 ml    Physical Exam   General:  Well appearing. No resp difficulty HEENT: Normal Neck: supple. No JVP. Carotids 2+ bilat; no bruits. No lymphadenopathy or thyromegaly appreciated. Cor: Mechanical heart sounds with LVAD hum present. Lungs: Clear Abdomen: soft, nontender, nondistended. No hepatosplenomegaly. No bruits or masses. Good bowel sounds. Driveline: C/D/I; securement device intact and driveline incorporated Extremities: no cyanosis, clubbing, rash, edema Neuro: alert & orientedx3, cranial nerves grossly intact. moves all 4 extremities w/o difficulty. Affect pleasant   Telemetry  NSR 80s 5 beat NSVT (Personally reviewed)   Labs   Basic Metabolic Panel: Recent Labs  Lab 02/16/23 0617 02/17/23 0543 02/18/23 0545 02/19/23 0525 02/20/23 0545   NA 135 136 136 138 136  K 3.8 4.0 4.2 4.1 4.2  CL 101 103 103 104 102  CO2 '22 22 23 28 26  '$ GLUCOSE 167* 172* 125* 123* 125*  BUN 21 25* 28* 25* 23  CREATININE 1.76* 1.58* 1.39* 1.28* 1.23  CALCIUM 8.7* 8.8* 8.9 8.7* 8.9   Liver Function Tests: No results for input(s): "AST", "ALT", "ALKPHOS", "BILITOT", "PROT", "ALBUMIN" in the last 168 hours.  CBC: Recent Labs  Lab 02/16/23 0617 02/17/23 0543 02/18/23 0545 02/19/23 0525 02/20/23 0545  WBC 8.6 7.9 7.4 6.9 6.7  HGB 10.3* 10.6* 10.4* 10.1* 10.3*  HCT 30.4* 31.5* 30.6* 30.0* 30.5*  MCV 84.9 85.1 83.4 85.0 85.7  PLT 225 230 208 209 200   INR: Recent Labs  Lab 02/16/23 0617 02/17/23 0543 02/18/23 0545 02/19/23 0525 02/20/23 0545  INR 2.4* 2.4* 2.3* 2.1* 2.2*    Other results:  Imaging   No results found.  Medications:     Scheduled Medications:  allopurinol  100 mg Oral Daily   amiodarone  200 mg Oral BID   Chlorhexidine Gluconate Cloth  6 each Topical Daily   clonazepam  0.25 mg Oral BID   feeding supplement (GLUCERNA SHAKE)  237 mL Oral TID WC   glipiZIDE  5 mg Oral Q breakfast   guaiFENesin  600 mg Oral BID   insulin aspart  0-5 Units Subcutaneous QHS   insulin aspart  0-9 Units Subcutaneous TID WC   loratadine  10 mg Oral Daily   metFORMIN  500 mg  Oral Q breakfast   metoprolol succinate  25 mg Oral Daily   minocycline  200 mg Oral BID   mirtazapine  15 mg Oral QHS   multivitamin with minerals  1 tablet Oral Q24H   pantoprazole  40 mg Oral Daily   sertraline  50 mg Oral Daily   sodium chloride flush  10-40 mL Intracatheter Q12H   tamsulosin  0.4 mg Oral Daily   thiamine  100 mg Oral Daily   traZODone  150 mg Oral QHS   Warfarin - Pharmacist Dosing Inpatient   Does not apply q1600   zinc sulfate  220 mg Oral Daily    Infusions:  DAPTOmycin (CUBICIN) 700 mg in sodium chloride 0.9 % IVPB 700 mg (02/19/23 2015)    PRN Medications: acetaminophen, colchicine, fluticasone, hydrALAZINE,  ipratropium, levalbuterol, ondansetron (ZOFRAN) IV, mouth rinse, prochlorperazine, sodium chloride flush  Patient Profile  62 y.o. 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 type 2 diabetes mellitus, COPD, hypertension, and HMIII VAD.    Admitted with recurrent LVAD complication driveline infection + suspected AECOPD Assessment/Plan:   1. LVAD Complication---> Recurrent driveline infection: MRSA driveline infection, admitted in 6/23 and again in 7/23-8/23 with multiple trips to the OR for debridement. He had completed vancomycin and then long-term doxycycline. Most recent culture PTA grew acinetobacter and MRSA. ID started minocycline. He had been off minocycline for at least 3 days prior to this admit w/ copious drainage around driveline. CT chest/abd/pelvis showed stranding around the driveline and small amount of fluid. Started on Vanc and Meropenum per ID. Now off meropenem (2/21).  OR for debridement w/ wound vac placement 2/20. Wound culture with MRSA, blood cultures negative.  Afebrile.  - Will need 6 wks abx. Vanc switched to dapto 2/27 with nausea. Now on dapto/minocycline. Once dapto complete continue suppressive minocycline   - Remains AF on IV abx - Returned to OR for I&D and wound vac removal 3/6. Site looks ok today 2. Chronic HFrEF: Echo 10/21 with EF 20-25%, mildly decreased RV function. LHC/RHC in 12/21 with patent grafts, low output. Suspect mixed ischemic/nonischemic cardiomyopathy (prior heavy ETOH and drugs as well as CAD).  No ETOH, drugs, smoking since CABG in 12/20. Admitted with cardiogenic shock in 12/21, had placement of Impella 5.5 initially, now s/p Heartmate 3 LVAD on 11/17/20.   - Volume status stable. Avoid diuretics  - INR 2.2 . Discussed dosing with PharmD personally. Adjusting warfarin dose.  - Not a  candidate for transplant with ongoing drug use.  3. HTN: MAPs stable today.  Can continue Toprol XL 25 mg daily  4. . Polysubstance Abuse: Recently started using methamphetamine again.  UDS + for amphetamines.  - SW team following. Unfortunately, limited resources for inpatient substance use due LVAD - Having paroxysms of n/v. He believes it is all due to anxiety.  - Psych saw patient 3/3, now on remeron and klonopin 5. PAF: S/p Maze and LA appendage clip with CABG in 12/20.  NSR this admission.  6. 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    - Continue scheduled atrovent.  - Levalbuterol prn.  - Have avoided prednisone with surgery at driveline site for recurrent infection.   7. DMII - SSI  8.  NSVT/PVCs: Frequent NSVT 2/23, may have been dry after diuresis.  Amiodarone gtt started to suppress.  - Continue po amiodarone 200  mg bid.  - Would eventually stop this medication, avoid with COPD.   9. Nausea/vomiting - Began 2/27. Assumed 2/2 vancomycin initially however vanc switched to daptomycin and nausea continues.  - KUB ok - suspect etiology due to severe emotional distress/anxiety +/- substance abuse WD  10. Major depression - Patient refusing to work with PT, not motivated and expresses frustration with his overall situation.  Ongoing drug use, no longer living with his wife.  Has been living with his mother who cannot care for him.  - Now saying that he would like his VAD to be off so that his wife and mother don't have him in common any more  - Psych consulted and saw patient, now on remeron 11. AKI - sCr Resolved. Stable today.  Now off losartan. Increasing PO intake 12. NSVT: No further NSVT.  - Keep K > 4.0 Mg > 2.0 13. Dispo - SNF placement ([facility]), plan for d/c today  I reviewed the LVAD parameters from today, and compared the results to the patient's prior recorded data.  No programming changes were made.  The LVAD is functioning within specified parameters.  The patient performs  LVAD self-test daily.  LVAD interrogation was negative for any significant power changes, alarms or PI events/speed drops.  LVAD equipment check completed and is in good working order.  Back-up equipment present.   LVAD education done on emergency procedures and precautions and reviewed exit site care.  Length of Stay: [place], NP 02/20/2023, 7:49 AM  VAD Team --- VAD ISSUES ONLY--- Pager [phone] (7am - 7am)  Advanced Heart Failure Team  Pager [phone] (M-F; 7a - 5p)  Please contact [facility] for night-coverage after hours (5p -7a ) and weekends on [email or web address] 7:49 AM  Agree with the above NP note.   Ready for d/c to SNF today.  Will complete 6 wks IV abx at SNF, then home.  No Lasix for now.  MAP up and down, for now will just continue his Toprol XL.   Loralie Champagne 02/20/2023

## 2023-02-20 NOTE — Progress Notes (Signed)
LVAD Coordinator Rounding Note:  Admitted 01/27/23 to Heart Failure service from ED due to driveline infection. Started on IV antibiotics.   S/p driveline debridement in OR 2/21,2/28 and 3/7. Pt now having daily dressing changes with Vashe solution.  Pt states he feels great this morning. He is excited to hopefully discharged to SNF to complete IV antibiotics. Pt's wife [name] is at bedside this morning dropping off pt's clothing.  Pt will need home IV antibiotics at discharge with a stop date of 03/11/23. Pt will need placement in SNF for IV antibiotics at discharge as he is living with his mom and she is unable to help him with care/medication administration. [facility] has accepted pt. [facility] admissions coordinator is aware of pts drug history.    Controller electively exchanged this admission. No further low voltage hazard alarms seen on interrogation.  Discussed follow up appt with Dr Aundra Dubin- appt scheduled 03/10/23 at 11:00 in [place] clinic and wound care with Dr Prescott Gum on 3/25. VAD coordinator will plan to deliver Presenter, broadcasting, MPU, and VAD education notebook to [facility]. [name] brought  pt's back up bag this morning. VAD coordinator will deliver dressing supplies (25 daily dressings, 15 anchors, 4 x 4s, 2 x 2s, cotton tip applicators, large tegaderms, and large bottle of VASHE solution to [facility] for facility use for daily dressing changes.   Reached out to ID team (Dr Gale Journey) for follow up appt and OPAT orders per Dr Keturah Barre note 02/07/23.   Vital signs: Temp:  97.8 HR: 80 Doppler Pressure: 88 Automatic BP: 98/81 (89) O2 Sat: 97% on RA Wt:192.9>197>195.9>190.9>187.6>188.9>184.1>189.6>182.3>181.8>182.3>183.4>191.6>184.5>188.2>188.4>186.5>189.6 lbs  LVAD interrogation reveals:  Speed: 5600 Flow: 4.6 Power: 4.1 w PI: 3 Alarms: none Events: >100 Hct: 30  Fixed speed:  5600 Low speed limit:  5300  Drive Line:  Existing VAD dressing removed and site care  performed using sterile technique by DR Darcey Nora. VASHE solution to wound bed. Debride with dry 2 x 2. Drive line exit site cleaned with Chlora prep applicators x 2, allowed to dry. Rylon sheet present in wound bed. Vashe soaked 2x2 gauze placed in would bed, and covered with dry gauze. Entire dressing covered with large tegaderm. Moderate amount of serosanguineous drainage noted on previous dressing.  No redness, foul odor, or tenderness noted. Drive line anchor intact. Daily sterile wet to dry dressing changes per bedside RN or VAD coordinator. Next dressing change in tomorrow 02/21/23 by VAD coordinator.       Labs:  LDH trend: 203>187>142>145>139>161>155>148>152>149>166>151>156>158>166>161>145>150  INR trend: 1.1>1.1>1.4>1.9>2.2>2.4>1.8>1.9>2.2>2.5>2.4>1.7>2.4>2.3>2.1>2.2  Anticoagulation Plan: -INR Goal: 2.0 - 2.5 -ASA Dose: none -Heparin 500 u/hr -off  ICD: N/A  Infection:  01/27/23>> blood cultures>> no growth final 01/27/23>>driveline culture>>FEW METHICILLIN RESISTANT STAPHYLOCOCCUS AUREUS  01/28/23>>driveline cx from OR>>FEW METHICILLIN RESISTANT STAPHYLOCOCCUS AUREUS; final  Plan/Recommendations:  Page VAD coordinator with equipment issues or driveline problems Pt ok to d/c to [facility] rehab today.  [name] RN [place] Coordinator  Office: [phone]  24/7 Pager: [phone]

## 2023-02-20 NOTE — Progress Notes (Signed)
ANTICOAGULATION CONSULT NOTE  Pharmacy Consult for warfarin  Indication: LVAD  Allergies  Allergen Reactions   Vancomycin Nausea And Vomiting and Other (See Comments)    Severe joint pain    Patient Measurements: Height: '5\' 10"'$  (177.8 cm) Weight: 86 kg (189 lb 9.5 oz) IBW/kg (Calculated) : 73   Vital Signs: Temp: 97.8 F (36.6 C) (03/14 0815) Temp Source: Oral (03/14 0815) BP: 98/81 (03/14 0815) Pulse Rate: 80 (03/14 0815)  Labs: Recent Labs    02/18/23 0545 02/19/23 0525 02/20/23 0545  HGB 10.4* 10.1* 10.3*  HCT 30.6* 30.0* 30.5*  PLT 208 209 200  LABPROT 25.4* 23.7* 24.5*  INR 2.3* 2.1* 2.2*  CREATININE 1.39* 1.28* 1.23  CKTOTAL 57  --   --      Estimated Creatinine Clearance: 64.3 mL/min (by C-G formula based on SCr of 1.23 mg/dL).   Medical History: Past Medical History:  Diagnosis Date   "    Arthritis    CAD (coronary artery disease)    a. s/p CABG in 11/2019 with LIMA-LAD, SVG-OM1, SVG-PDA and SVG-D1   CHF (congestive heart failure) (HCC)    a. EF < 20% by echo in 11/2019   Essential hypertension    PAF (paroxysmal atrial fibrillation) (HCC)    Type 2 diabetes mellitus (HCC)     Medications:   DAPTOmycin (CUBICIN) 700 mg in sodium chloride 0.9 % IVPB 700 mg (02/19/23 2015)    Assessment: 62 yo male on chronic warfarin for LVAD, poor compliance with outpatient checks. INR on admission 1.1. S/p OR 2/20 for drive line infection debridement. Previous warfarin dose 6 mg daily except 4 mg on Tues, Thurs.  INR today is therapeutic at 2.2, CBC and LDH stable.  Goal of Therapy:  INR 2-2.4 (keeping low in anticipation of return to OR soon) Monitor platelets by anticoagulation protocol: Yes   Plan:  Warfarin '4mg'$  PO x1 tonight - resume prior home regimen as above Daily INR  Arrie Senate, PharmD, BCPS, [facility] Clinical Pharmacist [phone] Please check AMION for all [place] numbers 02/20/2023

## 2023-02-20 NOTE — TOC Transition Note (Signed)
Transition of Care [facility]) - CM/SW Discharge Note   Patient Details  Name: Kevin Underwood. MRN: [id number] Date of Birth: [birth year]  Transition of Care [facility]) CM/SW Contact:  Erenest Rasher, RN Phone Number: [phone] 02/20/2023, 10:13 AM    Clinical Narrative:     Sharen Hint [facility] Coordinator, [place]. Requested dc summary and clinicals. Faxed SNF # [phone], call report to [phone]. Contacted [facility] for transport to SNF rehab.    Final next level of care: Skilled Nursing Facility Barriers to Discharge: No Barriers Identified   Patient Goals and CMS Choice CMS Medicare.gov Compare Post Acute Care list provided to:: Patient Choice offered to / list presented to : Patient  Discharge Placement                         Discharge Plan and Services Additional resources added to the After Visit Summary for     Discharge Planning Services: CM Consult Post Acute Care Choice: [place]: RN          Social Determinants of Health (SDOH) Interventions SDOH Screenings   Food Insecurity: No Food Insecurity (02/05/2023)  Housing: Low Risk  (02/05/2023)  Transportation Needs: Unmet Transportation Needs (02/05/2023)  Utilities: Not At Risk (02/05/2023)  Alcohol Screen: Low Risk  (11/10/2020)  Depression (PHQ2-9): Low Risk  (10/16/2022)  Financial Resource Strain: Medium Risk (05/17/2022)  Physical Activity: Inactive (11/10/2020)  Tobacco Use: Medium Risk (02/13/2023)     Readmission Risk Interventions    07/01/2022    9:05 AM 11/28/2020   11:57 AM  Readmission Risk Prevention Plan  Transportation Screening Complete Complete  Medication Review Press photographer) Complete Complete  PCP or Specialist appointment within 3-5 days of discharge Complete   HRI or [place] Complete Complete  SW Recovery Care/Counseling Consult Complete Complete  Palliative Care Screening Not Applicable Not  [place] Not Applicable Not Applicable

## 2023-02-20 NOTE — Plan of Care (Signed)
Problem: Education: Goal: Patient will understand all VAD equipment and how it functions 02/20/2023 1007 by Eulis Manly, RN Outcome: Adequate for Discharge 02/20/2023 1006 by Eulis Manly, RN Outcome: Adequate for Discharge Goal: Patient will be able to verbalize current INR target range and antiplatelet therapy for discharge home 02/20/2023 1007 by Eulis Manly, RN Outcome: Adequate for Discharge 02/20/2023 1006 by Eulis Manly, RN Outcome: Adequate for Discharge   Problem: Cardiac: Goal: LVAD will function as expected and patient will experience no clinical alarms 02/20/2023 1007 by Eulis Manly, RN Outcome: Adequate for Discharge 02/20/2023 1006 by Eulis Manly, RN Outcome: Adequate for Discharge   Problem: Education: Goal: Knowledge of General Education information will improve Description: Including pain rating scale, medication(s)/side effects and non-pharmacologic comfort measures 02/20/2023 1007 by Eulis Manly, RN Outcome: Adequate for Discharge 02/20/2023 1006 by Eulis Manly, RN Outcome: Adequate for Discharge   Problem: Health Behavior/Discharge Planning: Goal: Ability to manage health-related needs will improve 02/20/2023 1007 by Eulis Manly, RN Outcome: Adequate for Discharge 02/20/2023 1006 by Eulis Manly, RN Outcome: Adequate for Discharge   Problem: Clinical Measurements: Goal: Ability to maintain clinical measurements within normal limits will improve 02/20/2023 1007 by Eulis Manly, RN Outcome: Adequate for Discharge 02/20/2023 1006 by Eulis Manly, RN Outcome: Adequate for Discharge Goal: Will remain free from infection 02/20/2023 1007 by Eulis Manly, RN Outcome: Adequate for Discharge 02/20/2023 1006 by Eulis Manly, RN Outcome: Adequate for Discharge Goal: Diagnostic test results will improve 02/20/2023 1007 by Eulis Manly,  RN Outcome: Adequate for Discharge 02/20/2023 1006 by Eulis Manly, RN Outcome: Adequate for Discharge Goal: Respiratory complications will improve 02/20/2023 1007 by Eulis Manly, RN Outcome: Adequate for Discharge 02/20/2023 1006 by Eulis Manly, RN Outcome: Adequate for Discharge Goal: Cardiovascular complication will be avoided 02/20/2023 1007 by Eulis Manly, RN Outcome: Adequate for Discharge 02/20/2023 1006 by Eulis Manly, RN Outcome: Adequate for Discharge   Problem: Activity: Goal: Risk for activity intolerance will decrease 02/20/2023 1007 by Eulis Manly, RN Outcome: Adequate for Discharge 02/20/2023 1006 by Eulis Manly, RN Outcome: Adequate for Discharge   Problem: Nutrition: Goal: Adequate nutrition will be maintained 02/20/2023 1007 by Eulis Manly, RN Outcome: Adequate for Discharge 02/20/2023 1006 by Eulis Manly, RN Outcome: Adequate for Discharge   Problem: Coping: Goal: Level of anxiety will decrease 02/20/2023 1007 by Eulis Manly, RN Outcome: Adequate for Discharge 02/20/2023 1006 by Eulis Manly, RN Outcome: Adequate for Discharge   Problem: Elimination: Goal: Will not experience complications related to bowel motility 02/20/2023 1007 by Eulis Manly, RN Outcome: Adequate for Discharge 02/20/2023 1006 by Eulis Manly, RN Outcome: Adequate for Discharge Goal: Will not experience complications related to urinary retention 02/20/2023 1007 by Eulis Manly, RN Outcome: Adequate for Discharge 02/20/2023 1006 by Eulis Manly, RN Outcome: Adequate for Discharge   Problem: Pain Managment: Goal: General experience of comfort will improve 02/20/2023 1007 by Eulis Manly, RN Outcome: Adequate for Discharge 02/20/2023 1006 by Eulis Manly, RN Outcome: Adequate for Discharge   Problem: Safety: Goal: Ability to remain  free from injury will improve 02/20/2023 1007 by Eulis Manly, RN Outcome: Adequate for Discharge 02/20/2023 1006 by Eulis Manly, RN Outcome: Adequate for Discharge   Problem: Skin Integrity: Goal: Risk for impaired skin integrity will decrease 02/20/2023 1007 by Eulis Manly, RN Outcome: Adequate for  Discharge 02/20/2023 1006 by Eulis Manly, RN Outcome: Adequate for Discharge   Problem: Education: Goal: Knowledge of General Education information will improve Description: Including pain rating scale, medication(s)/side effects and non-pharmacologic comfort measures Outcome: Adequate for Discharge   Problem: Health Behavior/Discharge Planning: Goal: Ability to manage health-related needs will improve Outcome: Adequate for Discharge   Problem: Clinical Measurements: Goal: Ability to maintain clinical measurements within normal limits will improve Outcome: Adequate for Discharge Goal: Will remain free from infection Outcome: Adequate for Discharge Goal: Diagnostic test results will improve Outcome: Adequate for Discharge Goal: Respiratory complications will improve Outcome: Adequate for Discharge Goal: Cardiovascular complication will be avoided Outcome: Adequate for Discharge   Problem: Activity: Goal: Risk for activity intolerance will decrease Outcome: Adequate for Discharge   Problem: Nutrition: Goal: Adequate nutrition will be maintained Outcome: Adequate for Discharge   Problem: Coping: Goal: Level of anxiety will decrease Outcome: Adequate for Discharge   Problem: Elimination: Goal: Will not experience complications related to bowel motility Outcome: Adequate for Discharge Goal: Will not experience complications related to urinary retention Outcome: Adequate for Discharge   Problem: Pain Managment: Goal: General experience of comfort will improve Outcome: Adequate for Discharge   Problem: Safety: Goal: Ability to remain  free from injury will improve Outcome: Adequate for Discharge   Problem: Skin Integrity: Goal: Risk for impaired skin integrity will decrease Outcome: Adequate for Discharge

## 2023-02-20 NOTE — Progress Notes (Signed)
VAD coordinator delivered UBC, mpu and VAD driveline dressing supplies to [facility] today. I met with the Nursing staff there to go over [facility] care and emergency procedures. The RN will plan to do the dressing change daily. All information was provided including step by step directions for dressing change. Pt had all emergency equipment at bedside, 6 batteries and signs posted in his room with emergency procedures and numbers to call.   Staff informed of appt 3/20 in the clinic for wound care and INR. [facility] will provide transportation to this appt.  [name] RN, BSN VAD Coordinator 24/7 Pager [phone]

## 2023-02-21 ENCOUNTER — Other Ambulatory Visit (HOSPITAL_COMMUNITY): Payer: Self-pay | Admitting: Unknown Physician Specialty

## 2023-02-21 DIAGNOSIS — Z95811 Presence of heart assist device: Secondary | ICD-10-CM

## 2023-02-21 DIAGNOSIS — Z7901 Long term (current) use of anticoagulants: Secondary | ICD-10-CM

## 2023-02-24 ENCOUNTER — Other Ambulatory Visit: Payer: Self-pay | Admitting: "Endocrinology

## 2023-02-24 DIAGNOSIS — E0590 Thyrotoxicosis, unspecified without thyrotoxic crisis or storm: Secondary | ICD-10-CM

## 2023-02-26 ENCOUNTER — Encounter (HOSPITAL_COMMUNITY): Payer: Self-pay | Admitting: Unknown Physician Specialty

## 2023-02-26 ENCOUNTER — Other Ambulatory Visit (HOSPITAL_COMMUNITY): Payer: 59

## 2023-02-27 ENCOUNTER — Ambulatory Visit (HOSPITAL_COMMUNITY): Payer: Self-pay | Admitting: Pharmacist

## 2023-02-27 LAB — POCT INR: INR: 1.8 — AB (ref 2.0–3.0)

## 2023-02-27 NOTE — Progress Notes (Signed)
Pt seen at [facility] facility for wound care:  Existing VAD dressing removed and site care performed using sterile technique. VASHE solution to wound bed. Debride with dry 2 x 2. Drive line exit site cleaned with Chlora prep applicators x 2, allowed to dry. Rylon sheet present in wound bed. Vashe soaked 2x2 gauze placed in would bed, and covered with dry gauze. Vashe soaked 2 x 2 packed in tunneled area approx 2 cm. Entire dressing covered with large tegaderm. Moderate amount of serosanguineous drainage noted on previous dressing.  No redness, foul odor, or tenderness noted. Drive line anchor intact. Daily sterile wet to dry dressing changes per bedside RN, VAD coordinator or wife Linus Orn.      Pt was doing well at facility. Tells me that he is working out with PT everyday and walking outside in the courtyard. Pt has appt to see DR Prescott Gum on Monday. INR today 1.8. sent this to [place].  [name] RN, BSN VAD Coordinator 24/7 Pager [phone]

## 2023-02-28 ENCOUNTER — Encounter (HOSPITAL_COMMUNITY): Payer: Self-pay | Admitting: Unknown Physician Specialty

## 2023-03-03 ENCOUNTER — Ambulatory Visit (HOSPITAL_COMMUNITY)
Admit: 2023-03-03 | Discharge: 2023-03-03 | Disposition: A | Discharge status: Home / Self Care | Payer: No Typology Code available for payment source | Attending: Internal Medicine | Admitting: Internal Medicine

## 2023-03-03 DIAGNOSIS — Z4801 Encounter for change or removal of surgical wound dressing: Secondary | ICD-10-CM | POA: Insufficient documentation

## 2023-03-03 DIAGNOSIS — Z95811 Presence of heart assist device: Secondary | ICD-10-CM

## 2023-03-03 DIAGNOSIS — Z452 Encounter for adjustment and management of vascular access device: Secondary | ICD-10-CM | POA: Diagnosis not present

## 2023-03-03 MED ORDER — TRIAMCINOLONE ACETONIDE 0.5 % EX OINT: 1.0000 | TOPICAL_OINTMENT | Freq: Every day | CUTANEOUS | 1 refills | Status: DC

## 2023-03-03 NOTE — Progress Notes (Signed)
Pt seen at [facility] facility for wound care:  Existing VAD dressing removed and site care performed using sterile technique. VASHE solution to wound bed. Debride with dry 2 x 2. Drive line exit site cleaned with Chlora prep applicators x 2, allowed to dry. Rylon sheet present in wound bed. Vashe soaked 2x2 gauze placed in would bed, and covered with dry gauze. Vashe soaked 2 x 2 packed in tunneled area approx 2 cm. Entire dressing covered with large tegaderm. Moderate amount of serosanguineous drainage noted on previous dressing.  No redness, foul odor, or tenderness noted. Drive line anchor intact. Daily sterile wet to dry dressing changes per bedside RN, VAD coordinator or wife Linus Orn.        Pt was doing well at facility. Tells me that he is working out with PT everyday and walking outside in the courtyard. Pt has appt to see DR Prescott Gum on Monday at 1p.   [name] RN, BSN VAD Coordinator 24/7 Pager [phone]

## 2023-03-03 NOTE — Progress Notes (Signed)
Heart and Vascular Care Navigation  03/03/2023  Renley Barousse. [birth year] [id number]  Reason for Referral: Patient seen for follow up in VAD clinic.  Patient is participating in a Managed Medicaid Plan: No - [place] Dual Complete  Engaged with patient face to face for follow up visit for Heart and Vascular Care Coordination.                                                                                                   Assessment: Patient is a 62 yo male who is currently residing in a SNF for IV antibiotics. Patient states he anticipates being discharged from the SNF next week and he will return home with wife and son. He shared concerns of staying in the [place] area as he acknowledges that he needs to get out of the area where the drug traffic is higher. He would like to move to [facility] either alone or with his family.   He spoke at length about some recent events at the SNF where he states he had some outbursts of anger. He states he is frustrated that his meds are often distributed at different times and this morning he received his inhaler at 5:30 am which he felt was not necessary.  He did state that he was seen by psych while at the facility and was put on a new medication to help with his anger management. His sleeping pill was also adjusted and the new pill is helping tremendously and he feels like he is getting better rest at night.   Patient states he is motivated to remain drug free and has not had any urges since recent hospitalization. He is agreeable to explore some options for AA/NA meetings post discharge from the facility. He is also open to a referral for outpatient therapy to continue to work on his anger management as well as his addictions.   Social History:                                                                             SDOH Screenings   Food Insecurity: No Food Insecurity (02/05/2023)  Housing: Low Risk  (02/05/2023)  Transportation Needs:  Unmet Transportation Needs (02/05/2023)  Utilities: Not At Risk (02/05/2023)  Alcohol Screen: Low Risk  (11/10/2020)  Depression (PHQ2-9): Low Risk  (10/16/2022)  Financial Resource Strain: Medium Risk (05/17/2022)  Physical Activity: Inactive (11/10/2020)  Tobacco Use: Medium Risk (02/13/2023)    SDOH Interventions: Financial Resources:    N/a  Food Insecurity:   N/a  Housing Insecurity:   Occupational psychologist provided for [employer] area  Transportation:    N/a    Other Care Navigation Interventions:     Inpatient/Outpatient Substance Abuse Counseling/Rehab Options Discussed and provided list for local AA/NA meetings  Provided Pharmacy assistance  resources  N/a  Patient expressed Mental Health concerns Yes, Referred to:  currently receiving care at the SNF and will follow up with community referral once discharged.  Patient Referred to:    Follow-up plan:  Patient shared he is in good spirits and committed to remaining drug free.  CSW provided some resources for meetings as well as housing options. CSW will continue to follow for supportive needs and refer to community mental health once discharged from the SNF facility. Raquel Sarna, [place], [place]

## 2023-03-03 NOTE — Progress Notes (Signed)
Pt presents to clinic today with his wife Linus Orn for wound care with Dr Lucianne Lei Trigt:  Pt is receiving daily IV antibiotics with a stop day of April 2 then pt will transition to suppressive Minocycline.  Existing VAD dressing removed and site care performed using sterile technique by Dr Prescott Gum. VASHE solution to wound bed. Debride with dry 2 x 2. Drive line exit site cleaned with Chlora prep applicators x 2, allowed to dry. Rylon sheet present in wound bed. Vashe soaked 2x2 gauze placed in would bed, and covered with dry gauze. Vashe soaked 2 x 2 packed in tunneled area approx 2 cm. Entire dressing covered with large tegaderm. Moderate amount of serosanguineous drainage noted on previous dressing.  No redness, foul odor, or tenderness noted. Pt has rash where anchor was located. New cath grip anchor placed today. Daily sterile wet to dry dressing changes per bedside RN, VAD coordinator or wife Linus Orn. Please use Kenalog cream on old anchor site irritation daily with dressing changes.      Plan: Continue daily dressing changes using the Vashe solution. Use Kenalog cream daily on old anchor site daily with dressing changes.  [name] RN, BSN VAD Coordinator 24/7 Pager [phone]

## 2023-03-07 ENCOUNTER — Other Ambulatory Visit (HOSPITAL_COMMUNITY): Payer: Self-pay | Admitting: *Deleted

## 2023-03-07 DIAGNOSIS — Z7901 Long term (current) use of anticoagulants: Secondary | ICD-10-CM

## 2023-03-07 DIAGNOSIS — Z95811 Presence of heart assist device: Secondary | ICD-10-CM

## 2023-03-10 ENCOUNTER — Encounter (HOSPITAL_COMMUNITY): Payer: Self-pay | Admitting: Cardiology

## 2023-03-10 ENCOUNTER — Ambulatory Visit (HOSPITAL_COMMUNITY)
Admission: RE | Admit: 2023-03-10 | Discharge: 2023-03-10 | Disposition: A | Discharge status: Home / Self Care | Payer: 59 | Source: Ambulatory Visit | Attending: Cardiology | Admitting: Cardiology

## 2023-03-10 ENCOUNTER — Ambulatory Visit (HOSPITAL_COMMUNITY): Payer: Self-pay | Admitting: Pharmacist

## 2023-03-10 VITALS — BP 132/0 | HR 85 | Wt 209.4 lb

## 2023-03-10 DIAGNOSIS — Z7901 Long term (current) use of anticoagulants: Secondary | ICD-10-CM | POA: Insufficient documentation

## 2023-03-10 DIAGNOSIS — N183 Chronic kidney disease, stage 3 unspecified: Secondary | ICD-10-CM | POA: Insufficient documentation

## 2023-03-10 DIAGNOSIS — M109 Gout, unspecified: Secondary | ICD-10-CM | POA: Diagnosis not present

## 2023-03-10 DIAGNOSIS — J449 Chronic obstructive pulmonary disease, unspecified: Secondary | ICD-10-CM | POA: Diagnosis not present

## 2023-03-10 DIAGNOSIS — Z87891 Personal history of nicotine dependence: Secondary | ICD-10-CM | POA: Insufficient documentation

## 2023-03-10 DIAGNOSIS — D500 Iron deficiency anemia secondary to blood loss (chronic): Secondary | ICD-10-CM | POA: Insufficient documentation

## 2023-03-10 DIAGNOSIS — F1510 Other stimulant abuse, uncomplicated: Secondary | ICD-10-CM | POA: Diagnosis not present

## 2023-03-10 DIAGNOSIS — I5022 Chronic systolic (congestive) heart failure: Secondary | ICD-10-CM | POA: Diagnosis present

## 2023-03-10 DIAGNOSIS — Z951 Presence of aortocoronary bypass graft: Secondary | ICD-10-CM | POA: Diagnosis not present

## 2023-03-10 DIAGNOSIS — I472 Ventricular tachycardia, unspecified: Secondary | ICD-10-CM | POA: Diagnosis not present

## 2023-03-10 DIAGNOSIS — E0590 Thyrotoxicosis, unspecified without thyrotoxic crisis or storm: Secondary | ICD-10-CM | POA: Diagnosis not present

## 2023-03-10 DIAGNOSIS — Z79899 Other long term (current) drug therapy: Secondary | ICD-10-CM | POA: Diagnosis not present

## 2023-03-10 DIAGNOSIS — I2510 Atherosclerotic heart disease of native coronary artery without angina pectoris: Secondary | ICD-10-CM | POA: Diagnosis not present

## 2023-03-10 DIAGNOSIS — Z7984 Long term (current) use of oral hypoglycemic drugs: Secondary | ICD-10-CM | POA: Insufficient documentation

## 2023-03-10 DIAGNOSIS — I480 Paroxysmal atrial fibrillation: Secondary | ICD-10-CM | POA: Insufficient documentation

## 2023-03-10 DIAGNOSIS — I130 Hypertensive heart and chronic kidney disease with heart failure and stage 1 through stage 4 chronic kidney disease, or unspecified chronic kidney disease: Secondary | ICD-10-CM | POA: Diagnosis not present

## 2023-03-10 DIAGNOSIS — E1122 Type 2 diabetes mellitus with diabetic chronic kidney disease: Secondary | ICD-10-CM | POA: Insufficient documentation

## 2023-03-10 DIAGNOSIS — Z95811 Presence of heart assist device: Secondary | ICD-10-CM | POA: Insufficient documentation

## 2023-03-10 DIAGNOSIS — Z452 Encounter for adjustment and management of vascular access device: Secondary | ICD-10-CM | POA: Insufficient documentation

## 2023-03-10 LAB — COMPREHENSIVE METABOLIC PANEL
ALT: 21 U/L (ref 0–44)
AST: 18 U/L (ref 15–41)
Albumin: 3.3 g/dL — ABNORMAL LOW (ref 3.5–5.0)
Alkaline Phosphatase: 74 U/L (ref 38–126)
Anion gap: 10 (ref 5–15)
BUN: 22 mg/dL (ref 8–23)
CO2: 29 mmol/L (ref 22–32)
Calcium: 9.7 mg/dL (ref 8.9–10.3)
Chloride: 98 mmol/L (ref 98–111)
Creatinine, Ser: 1.25 mg/dL — ABNORMAL HIGH (ref 0.61–1.24)
GFR, Estimated: >60 mL/min (ref >60)
Glucose, Bld: 139 mg/dL — ABNORMAL HIGH (ref 70–99)
Potassium: 4.8 mmol/L (ref 3.5–5.1)
Sodium: 137 mmol/L (ref 135–145)
Total Bilirubin: 0.2 mg/dL — ABNORMAL LOW (ref 0.3–1.2)
Total Protein: 6.9 g/dL (ref 6.5–8.1)

## 2023-03-10 LAB — CBC
HCT: 34.4 % — ABNORMAL LOW (ref 39.0–52.0)
Hemoglobin: 11.2 g/dL — ABNORMAL LOW (ref 13.0–17.0)
MCH: 27.7 pg (ref 26.0–34.0)
MCHC: 32.6 g/dL (ref 30.0–36.0)
MCV: 85.1 fL (ref 80.0–100.0)
Platelets: 268 10*3/uL (ref 150–400)
RBC: 4.04 MIL/uL — ABNORMAL LOW (ref 4.22–5.81)
RDW: 15.2 % (ref 11.5–15.5)
WBC: 7.5 10*3/uL (ref 4.0–10.5)
nRBC: 0 % (ref 0.0–0.2)

## 2023-03-10 LAB — T4, FREE: Free T4: 0.65 ng/dL (ref 0.61–1.12)

## 2023-03-10 LAB — TSH: TSH: 4.56 u[IU]/mL — ABNORMAL HIGH (ref 0.350–4.500)

## 2023-03-10 LAB — LACTATE DEHYDROGENASE: LDH: 169 U/L (ref 98–192)

## 2023-03-10 LAB — PROTIME-INR
INR: 1.6 — ABNORMAL HIGH (ref 0.8–1.2)
Prothrombin Time: 18.7 seconds — ABNORMAL HIGH (ref 11.4–15.2)

## 2023-03-10 MED ORDER — AMIODARONE HCL 200 MG PO TABS: 200.0000 mg | ORAL_TABLET | Freq: Every day | ORAL | Status: DC

## 2023-03-10 NOTE — Progress Notes (Signed)
H&V Care Navigation CSW Progress Note  Clinical Social Worker met with patient to follow up on previous visit.  Patient is participating in a Managed Medicaid Plan:  No  CSW met with patient and wife in the clinic. Patient states he has one more day of IV antibiotics and looking forward to going home on Wednesday from the SNF. Patient shared multiple complaints about the SNF and states "nothing to be done at this point" since he is leaving. He is frustrated with the timing of his medications and states the food is always cold.  Patient was provided some resources last visit for possible housing solutions as he would like to move to [facility] and get out of [place]. He states he has not made any calls or followed up with resources provided although his plan is still to move if possible. CSW shared that if he has any evictions in the past that it will be difficult to find a landlord willing to accept his applications. Patient was vague with any answers.  Patient states he is still committed to attending AA/NA meetings post SNF and was open to look into a meeting for Thursday April 4th. He states he has not heard from [facility] for ongoing counseling yet.   CSW encouraged patient to follow up on housing resources provided and make a plan for attending the AA/NA meeting end of this week when discharged from SNF. CSW continues to follow. Raquel Sarna, [place], [place]    SDOH Screenings   Food Insecurity: No Food Insecurity (02/05/2023)  Housing: Low Risk  (02/05/2023)  Transportation Needs: Unmet Transportation Needs (02/05/2023)  Utilities: Not At Risk (02/05/2023)  Alcohol Screen: Low Risk  (11/10/2020)  Depression (PHQ2-9): Low Risk  (10/16/2022)  Financial Resource Strain: Medium Risk (05/17/2022)  Physical Activity: Inactive (11/10/2020)  Tobacco Use: Medium Risk (03/10/2023)

## 2023-03-10 NOTE — Progress Notes (Addendum)
Patient presents for hospital follow up with wife [name] following admission for drive line infection with debridement and wound vac placement. Pt has been seen weekly in [facility] for dressing changes with Dr.Vantrigt. Denies lightheadedness, dizziness, falls and signs of bleeding.   Pt currently at [facility] for daily IV antibiotics. Course completion due tomorrow 03/11/23 and pt will transition to suppressive Minocycline 200mg  BID.  Pt hypertensive today. Pt states he has not had his morning medication. PT unaware of medications he is currently taking. Reviewed medications on latest discharge AVS with MD. Per Dr. Aundra Dubin reduce Amiodarone to 200mg  daily. Stone to notify of medication change and request updated medication list. Awaiting fax.  TSH and T4 added on to labs today. Will collect T3 at next appointment. Will route to Dr. Dorris Fetch. Pt asked to reschedule appointment.  Vital Signs:  Doppler Pressure: 132 Automatc BP: 144/106 (116) Repeat: 112/101 (106) HR: 85 SPO2: 99    Weight: 209.4 lb w/o ept Last weight: 203.4 lb w/ ept  VAD Indication: Destination Therapy. HM III implanted 11/17/20 by Dr Orvan Seen   VAD interrogation: Speed: 5600 Flow: 4.2 Power: 4.2 w    PI: 3.9 Hct: 30  Alarms: none  Events: 100+  Fixed speed: 5500 Low speed limit: 5200  Primary controller: replace back up battery in 23 months Back up controller: replace back up battery in 17 months   I reviewed the LVAD parameters from today and compared the results to the patient's prior recorded data. LVAD interrogation was NEGATIVE for significant power changes, NEGATIVE for clinical alarms and STABLE for PI events/speed drops. No programming changes were made and pump is functioning within specified parameters. Pt is performing daily controller and system monitor self tests along with completing weekly and monthly maintenance for LVAD equipment.   LVAD equipment check  completed and is in good working order. Back-up equipment not present.    Annual Equipment Maintenance on UBC/PM was performed today on 01/15/2023.  Exit Site Care: Existing VAD dressing removed and site care performed using sterile technique by Dr Prescott Gum. VASHE solution to wound bed. Debride with dry 2 x 2. Drive line exit site cleaned with Chlora prep applicators x 2, allowed to dry. Rylon sheet present in wound bed. Vashe soaked 2x2 gauze placed in would bed, and covered with dry gauze. Vashe soaked 2 x 2 packed in tunneled area approx 2 cm. Entire dressing covered with large tegaderm. Moderate amount of serosanguineous drainage noted on previous dressing.  No redness, foul odor, or tenderness noted. Pt has rash where anchor was located. New cath grip anchor placed today. Daily sterile wet to dry dressing changes per bedside RN, VAD coordinator or wife Linus Orn. Please use Kenalog cream on old anchor site irritation daily with dressing changes.  Patient given 14 daily kits, 14 anchors, Vashe wound solution and large tegaderms for at home use.        BP & Labs:  Doppler 132- Doppler reflecting modified systolic   Hgb 123XX123 - No S/S of bleeding. Specifically denies melena/BRBPR or nosebleeds. Occasional blood when wiping.    LDH 169 -  with established baseline of 190 - 360. Denies tea-colored urine. No power elevations noted on interrogation.    Plan: Reduce Amiodarone to 200mg  once daily Return to [facility] Monday at 1:30pm for dressing change with Dr. Darcey Nora Return to [facility] in 1 month for follow up with Dr. Aundra Dubin 4. Coumadin dosing per Ander Purpura RPH 5.  Reschedule appointment with Dr. Dorris Fetch Endocrinology  Bobbye Morton RN,BSN [place] Coordinator  Office: [phone]  24/7 Pager: [phone]   HF cardiologist: Dr. Aundra Dubin  Follow up for Heart Failure/LVAD:  61 y.o. 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 type 2 diabetes mellitus, COPD, and hypertension. Now s/p Heartmate 3 LVAD.   Patient had echo in 10/21 with EF 20-25%.  He was admitted in 12/21 with cardiogenic shock.  Cath showed patent grafts and low cardiac output.  Suspect mixed ischemic/nonischemic cardiomyopathy, prior heavy ETOH though no ETOH or smoking since 12/20 CABG.  He required stabilization with Impella 5.5 and ultimately had placement of Heartmate 3 LVAD.  Paroxysmal atrial fibrillation noted post-op, but generally stable course and discharged home.    Patient was admitted in 10/22 with COPD exacerbation.  Wilder Glade was stopped with frequent PIs.  He was treated with steroids, nebs, antibiotics with improvement.   Ramp echo was done in 2/23.  At 5600 rpm the LV was small and the septum with some left shift, aortic valve opening 3/5 beats.  At 5500 rpm, the LV was larger and septum more midline, aortic valve still opening 3/5 beats.  The RV was moderately enlarged with normal systolic function. I left speed at 5500 rpm.   Patient had a prolonged admission in 6/23 and again in 7/23-8/23 with MRSA driveline infection.  He went to the OR multiple times for debridement and wound vac changes.  He completed vancomycin and was placed on doxycycline.   Levofloxacin added d/t increased drainage and redness after last f/u on 09/04/22. Wound culture grew acinetobacter.  Wound initially improved but cultures repeated d/t decreased healing over last few dressing changes. Levofloxacin recently stopped and back on doxy.  Grew acinetobacter and MRSA on culture. ICD recommended switching to minocycline 200 BID based on culture results.  Patient was admitted with swollen and erythematous right wrist in 10/23.  There was concern for septic arthritis. CT showed no effusion that could be tapped.  Suspect this was likely a gout flare as it improved rapidly with  colchicine.    Patient was admitted with recurrent MRSA driveline site infection in 2/24.  He went to the OR several times for debridement and wound vac management. BP and creatinine labile, we ended up stopping Lasix and anti-hypertensives except for Toprol XL. Also with mild AECOPD.  He is now on daptomycin + minocycline, he is in SNF until he completes daptomycin (this week).  He says that his breathing is stable, no exertional dyspnea though he has not been very active.  No lightheadedness.  MAP high today but has not had any meds yet. Driveline site looks ok, minimal drainage.   Labs (10/23): K 4.4, creatinine 0.94, hgb 10.9, LDH 145 Labs (3/24): creatinine 1.23   Past Medical History:  Diagnosis Date   "    Arthritis    CAD (coronary artery disease)    a. s/p CABG in 11/2019 with LIMA-LAD, SVG-OM1, SVG-PDA and SVG-D1   CHF (congestive heart failure)    a. EF < 20% by echo in 11/2019   Essential hypertension    PAF (paroxysmal atrial fibrillation)    Type 2 diabetes mellitus     Current Outpatient Medications  Medication Sig Dispense Refill   acetaminophen (TYLENOL) 325 MG tablet Take 2 tablets (650 mg total) by mouth every 4 (  four) hours as needed for headache or mild pain.     albuterol (PROVENTIL) (2.5 MG/3ML) 0.083% nebulizer solution INHALE 3 ML BY NEBULIZATION EVERY 6 HOURS AS NEEDED FOR WHEEZING OR SHORTNESS OF BREATH 450 mL 1   albuterol (VENTOLIN HFA) 108 (90 Base) MCG/ACT inhaler Inhale 2 puffs into the lungs every 6 (six) hours as needed for wheezing or shortness of breath.     allopurinol (ZYLOPRIM) 100 MG tablet Take 1 tablet (100 mg total) by mouth daily. 90 tablet 3   clonazePAM (KLONOPIN) 0.25 MG disintegrating tablet Take 1 tablet (0.25 mg total) by mouth 2 (two) times daily. 60 tablet 1   colchicine 0.6 MG tablet Take 1 tablet (0.6 mg total) by mouth daily as needed (gout pain).     COMBIVENT RESPIMAT 20-100 MCG/ACT AERS respimat Inhale 1 puff into the lungs every  6 (six) hours.     daptomycin (CUBICIN) IVPB Inject 700 mg into the vein daily for 20 days. Indication:  MRSA driveline infection  First Dose: Yes Last Day of Therapy:  03/11/23  Labs - Once weekly:  CBC/D, BMP, and CPK Labs - Every other week:  ESR and CRP 20 Units 0   fluticasone (FLONASE ALLERGY RELIEF) 50 MCG/ACT nasal spray Place 2 sprays into both nostrils as needed for allergies or rhinitis.     glipiZIDE (GLUCOTROL) 5 MG tablet Take 1 tablet (5 mg total) by mouth daily. 30 tablet 11   loratadine (CLARITIN) 10 MG tablet Take 1 tablet (10 mg total) by mouth daily. 30 tablet 6   metFORMIN (GLUCOPHAGE) 1000 MG tablet Take 1,000 mg by mouth 2 (two) times daily.     methimazole (TAPAZOLE) 5 MG tablet TAKE 1 TABLET BY MOUTH EVERY DAY WITH BREAKFAST (Patient taking differently: Take 5 mg by mouth daily before breakfast.) 90 tablet 1   metoprolol succinate (TOPROL XL) 25 MG 24 hr tablet Take 1 tablet (25 mg total) by mouth daily. 30 tablet 6   minocycline (MINOCIN) 100 MG capsule Take 2 capsules (200 mg total) by mouth 2 (two) times daily. 120 capsule 3   mirtazapine (REMERON) 15 MG tablet Take 1 tablet (15 mg total) by mouth at bedtime.     Multiple Vitamin (MULTIVITAMIN WITH MINERALS) TABS tablet Take 1 tablet by mouth daily.     Multiple Vitamin (MULTIVITAMIN) tablet Take 1 tablet by mouth daily.     pantoprazole (PROTONIX) 40 MG tablet TAKE 1 TABLET BY MOUTH EVERY DAY (Patient taking differently: Take 40 mg by mouth daily.) 90 tablet 2   tamsulosin (FLOMAX) 0.4 MG CAPS capsule TAKE 1 CAPSULE BY MOUTH EVERY DAY 90 capsule 3   thiamine (VITAMIN B-1) 100 MG tablet Take 1 tablet (100 mg total) by mouth daily.     traZODone (DESYREL) 150 MG tablet Take 1 tablet (150 mg total) by mouth at bedtime. 90 tablet 3   TRELEGY ELLIPTA 100-62.5-25 MCG/ACT AEPB Inhale 1 puff into the lungs daily.     triamcinolone ointment (KENALOG) 0.5 % Apply 1 Application topically daily. Apply to affected area during wound  care daily 30 g 1   warfarin (COUMADIN) 4 MG tablet Take 1-1.5 tablets (4-6 mg total) by mouth See admin instructions. 4 mg every Tuesday and Thursday. 6 mg all other days     zinc sulfate 220 (50 Zn) MG capsule Take 1 capsule (220 mg total) by mouth daily.     amiodarone (PACERONE) 200 MG tablet Take 1 tablet (200 mg total) by mouth daily.  feeding supplement, GLUCERNA SHAKE, (GLUCERNA SHAKE) LIQD Take 237 mLs by mouth 3 (three) times daily with meals. (Patient not taking: Reported on 03/10/2023)  0   metFORMIN (GLUCOPHAGE) 500 MG tablet TAKE 1 TABLET BY MOUTH 2 TIMES DAILY WITH A MEAL. (Patient not taking: Reported on 01/27/2023) 180 tablet 1   sertraline (ZOLOFT) 50 MG tablet Take 1 tablet (50 mg total) by mouth daily. (Patient not taking: Reported on 01/27/2023) 90 tablet 3   No current facility-administered medications for this encounter.    Vancomycin  REVIEW OF SYSTEMS: All systems negative except as listed in HPI, PMH and Problem list.   LVAD INTERROGATION:  Please see LVAD nurse's note for interrogation.   I reviewed the LVAD parameters from today, and compared the results to the patient's prior recorded data.  No programming changes were made.  The LVAD is functioning within specified parameters.  The patient performs LVAD self-test daily.  LVAD interrogation was negative for any significant power changes, alarms or PI events/speed drops.  LVAD equipment check completed and is in good working order.  Back-up equipment present.   LVAD education done on emergency procedures and precautions and reviewed exit site care.    Vitals:   03/10/23 1420 03/10/23 1421 03/10/23 1422  BP: (!) 114/106 (!) 112/101 (!) 132/0  Pulse: 85    SpO2: 99%    Weight: 95 kg (209 lb 6.4 oz)    MAP 120  Physical Exam: General: Well appearing this am. NAD.  HEENT: Normal. Neck: Supple, JVP 7-8 cm. Carotids OK.  Cardiac:  Mechanical heart sounds with LVAD hum present.  Lungs:  Distant BS Abdomen:   NT, ND, no HSM. No bruits or masses. +BS  LVAD exit site: Well-healed and incorporated. Dressing dry and intact. No erythema or drainage. Stabilization device present and accurately applied. Driveline dressing changed daily per sterile technique. Extremities:  Warm and dry. No cyanosis, clubbing, rash, or edema.  Neuro:  Alert & oriented x 3. Cranial nerves grossly intact. Moves all 4 extremities w/o difficulty. Affect pleasant    ASSESSMENT AND PLAN: 1.  Gout: Continue allopurinol, he has prn colchicine.  2.  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  Cultures have grown acinetobacter and MRSA. He was readmitted with driveline infection in 2/24, started on daptomycin for MRSA. Driveline site looks ok today.  - Continue daptomycin + minocycline.  He finishes daptomycin in 1 more day then will continue long-term on minocycline.    3.  Chronic systolic CHF: Echo [id number] with EF 20-25%, mildly decreased RV function. LHC/RHC in 12/21 with patent grafts, low output. Suspect mixed ischemic/nonischemic cardiomyopathy (prior heavy ETOH and drugs as well as CAD).  No ETOH, drugs, smoking since CABG in 12/20. Admitted with cardiogenic shock in 12/21, had placement of Impella 5.5 initially, now s/p Heartmate 3 LVAD on 11/17/20.  Ramp echo 2/23 with speed decreased to 5500 rpm. He would eventually like heart transplant. Speed now back to 5600 rpm.  MAP elevated today but has had not meds yet.  He is not volume overloaded on exam.  MAP has been labile, creatinine has been labile.     - Continue current Toprol XL 25 mg daily.  - He has had no meds today, would not add anti-hypertensive given history of BP lability.  - He does not need a diuretic.   - Continue warfarin, INR goal 2-2.5.  4. 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  - Would consider eventual  reassessment of renal artery stenosis and possible treatment  - BMET today.  5.  CAD: S/p CABG 12/20.  LHC pre-VAD with patent grafts, no target for intervention. - Needs to restart Crestor 20 mg daily.  6.  Atrial fibrillation: Paroxysmal.  S/p Maze and LA appendage clip with CABG in 12/20. Maintaining SR.  - Continue Toprol XL 25 mg daily.  - Off amiodarone with hyperthyroidism. 7. Type 2 diabetes: Per PCP.  8. 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 Strongly encouraged abstinence.  9. 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  Followed by endocrinology.  - Amiodarone restarted last admission with NSVT/PVCs, now stabilized and would like to stop amiodarone today.  - Continue methimazole  - Endocrinology followup.   - Check TSH, free T3, free T4 today.  10. Anemia (chronic blood loss): CBC today.   11. COPD: Prior smoker.  Not wheezing today.  - Continue Trelegy.  - Will arrange for PFTs.  - Needs followup with pulmonary, we have referred him to [facility] pulmonary in [place].  12. PVCs/NSVT: Noted last admission and amiodarone restarted.   - With history of hyperthyroidism, need to stop amiodarone.   Loralie Champagne 03/10/2023

## 2023-03-10 NOTE — Patient Instructions (Signed)
Reduce Amiodarone to 200mg  once daily Return to [facility] Monday at 1:30pm for dressing change with Dr. Darcey Nora Return to [facility] in 1 month for follow up with Dr. Aundra Dubin 4. Coumadin dosing per Ander Purpura RPH 5. Reschedule appointment with Dr. Dorris Fetch Endocrinology

## 2023-03-11 ENCOUNTER — Telehealth (HOSPITAL_COMMUNITY): Payer: Self-pay | Admitting: *Deleted

## 2023-03-11 NOTE — Telephone Encounter (Signed)
Received notification from SNF that pt was discharged with PICC line in place. Facility staff asked pt to come back inside to remove PICC and pt refused.   I called and spoke with pt's wife [name]. Pt will come to VAD clinic tomorrow morning for PICC line removal per Dr Prescott Gum.   Emerson Monte RN [place] Coordinator  Office: [phone]  24/7 Pager: [phone]

## 2023-03-12 ENCOUNTER — Other Ambulatory Visit (HOSPITAL_COMMUNITY): Payer: Self-pay | Admitting: Cardiology

## 2023-03-12 ENCOUNTER — Other Ambulatory Visit (HOSPITAL_COMMUNITY): Payer: Self-pay

## 2023-03-12 ENCOUNTER — Ambulatory Visit (HOSPITAL_COMMUNITY)
Admission: RE | Admit: 2023-03-12 | Discharge: 2023-03-12 | Disposition: A | Discharge status: Home / Self Care | Payer: 59 | Source: Ambulatory Visit | Attending: Cardiology | Admitting: Cardiology

## 2023-03-12 DIAGNOSIS — Y828 Other medical devices associated with adverse incidents: Secondary | ICD-10-CM | POA: Diagnosis not present

## 2023-03-12 DIAGNOSIS — Z4801 Encounter for change or removal of surgical wound dressing: Secondary | ICD-10-CM | POA: Diagnosis present

## 2023-03-12 DIAGNOSIS — Z95811 Presence of heart assist device: Secondary | ICD-10-CM

## 2023-03-12 DIAGNOSIS — E785 Hyperlipidemia, unspecified: Secondary | ICD-10-CM

## 2023-03-12 DIAGNOSIS — T827XXA Infection and inflammatory reaction due to other cardiac and vascular devices, implants and grafts, initial encounter: Secondary | ICD-10-CM | POA: Insufficient documentation

## 2023-03-12 MED ORDER — ROSUVASTATIN CALCIUM 10 MG PO TABS: 20.0000 mg | ORAL_TABLET | Freq: Every day | ORAL | 3 refills | Status: DC

## 2023-03-12 NOTE — Progress Notes (Signed)
Pt presents to clinic today with his wife Linus Orn for wound care with Dr Prescott Gum.  Completed IV antibiotics 4/2, and was discharged home from [facility]. He has started suppressive Minocycline. Wound culture obtained today  Per Dr Aundra Dubin will stop Amiodarone and restart Crestor 20 mg daily. Prescriptions sent to pt's pharmacy per [facility].   Existing VAD dressing removed and site care performed using sterile technique by Dr Prescott Gum. VASHE solution to wound bed. Debride with dry 2 x 2. Drive line exit site cleaned with Chlora prep applicators x 2, allowed to dry. Rylon sheet present in wound bed. Vashe soaked 2x2 gauze placed in would bed, and covered with dry gauze. Vashe soaked 2 x 2 packed in tunneled area approx 2 cm. Entire dressing covered with large tegaderm. Moderate amount of serosanguineous drainage noted on previous dressing.  No redness, foul odor, or tenderness noted. Anchor intact. Daily sterile wet to dry dressing changes per Linus Orn. Provided with 2 bottles of Vashe solution today.     Plan: Stop Amiodarone Start Crestor 20 mg (2 tablets) daily Continue daily dressing changes using the Vashe solution Return to clinic next week for previously scheduled appointment  Emerson Monte RN [place] Coordinator  Office: [phone]  24/7 Pager: [phone]

## 2023-03-12 NOTE — Progress Notes (Addendum)
Patient and wife [name] presented to [facility] this morning for PICC removal with Dr. Darcey Nora. See separate note for details. Patient informed of medication changes per Dr. Aundra Dubin. Patient to stop amiodarone and restart Crestor 20mg  daily. Prescription sent to [facility] in [place]. Patient and wife verbalized understanding.   Bobbye Morton RN, BSN VAD Coordinator 24/7 Pager [phone]  CT surgery  Patient examined, VAD tunnel wound cultured and wet-to-dry dressing with Vashe wound solution was performed using sterile technique.  The patient was discharged from the skilled nursing facility yesterday after he completed his course of IV antibiotics (daptomycin).  He has transition to oral minocycline for chronic suppressive therapy.  The patient is feeling well with good energy and was doing yard work yesterday.  First the left arm PICC line was removed using sterile technique with care being taken to remove the entire length of the catheter.  A pressure dressing was applied.  Next the wound was exposed and prepped as a sterile field.  A aerobic wound culture was sampled.  The wound was examined and there was 95% clean granulation tissue.  There is no purulent drainage and the power cord appears to be incorporating with a minimal tunnel of 1 cm.  Wet-to-dry dressings with Vashe wound solution were performed and wound care teaching to the patient's wife was done at the bedside.  Exam  Alert and oriented no complaints Lungs clear Upper abdominal open wound with clean granulation tissue as described above Normal VAD hum Extremities warm without edema Neuro intact  Plan Daily wet-to-dry VAD wound dressing changes with Vashe wound solution Weekly VAD clinic visits for wound care and observation Change in medications as noted above involving amiodarone and Crestor Minocycline 2 capsules (200 mg) twice daily

## 2023-03-12 NOTE — Patient Instructions (Addendum)
Stop Amiodarone Start Crestor 20 mg (2 tablets) daily Continue daily dressing changes using the Vashe solution Return to clinic next week for previously scheduled appointment

## 2023-03-14 ENCOUNTER — Other Ambulatory Visit (HOSPITAL_COMMUNITY): Payer: Self-pay | Admitting: *Deleted

## 2023-03-14 DIAGNOSIS — Z95811 Presence of heart assist device: Secondary | ICD-10-CM

## 2023-03-14 DIAGNOSIS — Z7901 Long term (current) use of anticoagulants: Secondary | ICD-10-CM

## 2023-03-14 DIAGNOSIS — E0590 Thyrotoxicosis, unspecified without thyrotoxic crisis or storm: Secondary | ICD-10-CM

## 2023-03-15 LAB — AEROBIC CULTURE W GRAM STAIN (SUPERFICIAL SPECIMEN): Culture: NO GROWTH

## 2023-03-17 ENCOUNTER — Other Ambulatory Visit (HOSPITAL_COMMUNITY): Payer: 59

## 2023-03-17 ENCOUNTER — Inpatient Hospital Stay: Payer: 59 | Admitting: Internal Medicine

## 2023-03-18 ENCOUNTER — Ambulatory Visit: Payer: 59 | Admitting: "Endocrinology

## 2023-03-18 ENCOUNTER — Other Ambulatory Visit (HOSPITAL_COMMUNITY): Payer: 59

## 2023-03-19 ENCOUNTER — Other Ambulatory Visit (HOSPITAL_COMMUNITY): Payer: 59

## 2023-03-19 ENCOUNTER — Telehealth (HOSPITAL_COMMUNITY): Payer: Self-pay

## 2023-03-19 NOTE — Telephone Encounter (Signed)
Kevin Underwood called to cancel Kevin Underwood's appointment today. He is refusing to come to VAD Clinic this week. Kevin Underwood states that she is having to change pt's dressing twice a day. Pt had follow up with endocrinology yesterday that he no showed. Educated on importance of keeping all follow up appointments. Pt rescheduled Monday at 0900 in VAD Clinic for dressing change.  Kevin Davies RN, BSN VAD Coordinator 24/7 Pager [phone]

## 2023-03-24 ENCOUNTER — Other Ambulatory Visit (HOSPITAL_COMMUNITY): Payer: Self-pay | Admitting: Internal Medicine

## 2023-03-24 ENCOUNTER — Ambulatory Visit (HOSPITAL_COMMUNITY): Payer: Self-pay | Admitting: Pharmacist

## 2023-03-24 ENCOUNTER — Telehealth (HOSPITAL_COMMUNITY): Payer: Self-pay | Admitting: Licensed Clinical Social Worker

## 2023-03-24 ENCOUNTER — Telehealth (HOSPITAL_COMMUNITY): Payer: Self-pay

## 2023-03-24 ENCOUNTER — Ambulatory Visit (HOSPITAL_COMMUNITY)
Admission: RE | Admit: 2023-03-24 | Discharge: 2023-03-24 | Disposition: A | Discharge status: Home / Self Care | Payer: 59 | Source: Ambulatory Visit | Attending: Internal Medicine | Admitting: Internal Medicine

## 2023-03-24 DIAGNOSIS — E0590 Thyrotoxicosis, unspecified without thyrotoxic crisis or storm: Secondary | ICD-10-CM | POA: Insufficient documentation

## 2023-03-24 DIAGNOSIS — Z4801 Encounter for change or removal of surgical wound dressing: Secondary | ICD-10-CM | POA: Insufficient documentation

## 2023-03-24 DIAGNOSIS — Z7901 Long term (current) use of anticoagulants: Secondary | ICD-10-CM | POA: Insufficient documentation

## 2023-03-24 DIAGNOSIS — Z95811 Presence of heart assist device: Secondary | ICD-10-CM | POA: Diagnosis not present

## 2023-03-24 LAB — PROTIME-INR
INR: 4.2 (ref 0.8–1.2)
Prothrombin Time: 40.4 seconds — ABNORMAL HIGH (ref 11.4–15.2)

## 2023-03-24 MED ORDER — CLONAZEPAM 0.25 MG PO TBDP: 0.2500 mg | ORAL_TABLET | Freq: Two times a day (BID) | ORAL | 1 refills | Status: DC

## 2023-03-24 NOTE — Progress Notes (Addendum)
Pt presents to clinic today with his wife Kennith Center for wound care.  Pt and wife report compliance with Minocycline. Denies missing any doses. Kennith Center reports she is changing dressing once daily. Last week there were a few days when she changed twice daily as dressing became dislodged.    Pt requesting refill of Klonopin. Discussed with Dr Shirlee Latch- ok to refill. Refill sent to pt's pharmacy per Brynda Peon NP per Dr Shirlee Latch.   Pt states he is unable to come to clinic weekly for wound care due to it being "too much" to come to clinic weekly. He would like to be out of the hospital/not at clinic as much as possible. Pt agreeable to come to clinic once every 2 weeks for VAD coordinators to monitor wound healing progression. Kennith Center will notify VAD coordinators if pt needs to be seen sooner.   Pt's wife privately voiced concern to VAD coordinator regarding pt's ongoing substance use. She reports he has not used in the last 3 days in anticipation of coming to clinic. Pt states he has been "doing just fine"and "doing everything I can to stay healthy" since discharging from [facility]. VAD coordinator will update Lasandra Beech CSW as she has been working with pt on substance abuse issues.   Exit site care: Existing VAD dressing removed and site care performed using sterile technique. VASHE solution to wound bed. Debride with dry 2 x 2. Drive line exit site cleaned with Chlora prep applicators x 2, allowed to dry. Vashe soaked 2x2 gauze placed in would bed, and covered with dry gauze. Vashe soaked 2 x 2 packed in tunneled area. Entire dressing covered with large tegaderm. Moderate amount of serosanguineous drainage noted on previous dressing. Wound tunnels 4 cm. No redness, foul odor, or tenderness noted. Anchor replaced. Daily sterile wet to dry dressing changes per Kennith Center. Provided with 14 daily kits, 14 anchors, 1 bottle of VASHE, 1 box of 4 x 4s,  and 2 boxes of large tegaderms for home use.      Plan: Continue  daily dressing changes using the Vashe solution Coumadin dosing per Lauren PharmD Return to clinic next week for previously scheduled appointment  Alyce Pagan RN VAD Coordinator  Office: [phone]  24/7 Pager: [phone]

## 2023-03-24 NOTE — Addendum Note (Signed)
Encounter addended by: Bernita Raisin, RN on: 03/24/2023 11:47 AM  Actions taken: Clinical Note Signed

## 2023-03-24 NOTE — Addendum Note (Signed)
Encounter addended by: Bernita Raisin, RN on: 03/24/2023 2:02 PM  Actions taken: Clinical Note Signed

## 2023-03-24 NOTE — Addendum Note (Signed)
Encounter addended by: Bernita Raisin, RN on: 03/24/2023 11:31 AM  Actions taken: Clinical Note Signed

## 2023-03-24 NOTE — Telephone Encounter (Signed)
CSW contacted patient to follow up on clinic appointment today regarding financial concerns and ongoing substance use. CSW unable to contact patient as no answer at both numbers. Message left for return. Kevin Beech, LCSW, CCSW-MCS [phone]

## 2023-03-24 NOTE — Telephone Encounter (Signed)
Critical INR at 4.2 from [facility]

## 2023-04-03 ENCOUNTER — Other Ambulatory Visit (HOSPITAL_COMMUNITY): Payer: Self-pay | Admitting: Unknown Physician Specialty

## 2023-04-03 DIAGNOSIS — Z7901 Long term (current) use of anticoagulants: Secondary | ICD-10-CM

## 2023-04-03 DIAGNOSIS — Z95811 Presence of heart assist device: Secondary | ICD-10-CM

## 2023-04-08 ENCOUNTER — Telehealth (HOSPITAL_COMMUNITY): Payer: Self-pay | Admitting: Pharmacist

## 2023-04-08 ENCOUNTER — Inpatient Hospital Stay (HOSPITAL_COMMUNITY): Admission: RE | Admit: 2023-04-08 | Payer: 59 | Source: Ambulatory Visit

## 2023-04-08 ENCOUNTER — Encounter (HOSPITAL_COMMUNITY): Payer: Self-pay

## 2023-04-08 NOTE — Telephone Encounter (Signed)
Patient is overdue for INR draw. Last INR was collected on 03/24/23. Left multiple VMs instructing patient to repeat INR on his home machine or come to clinic for INR draw within one week. Patient has not checked on his home machine and no-showed for multiple clinic visits. LVAD coordinators are aware and have sent patient letter asking him to reschedule.   Karle Plumber, PharmD, BCPS, BCCP, CPP Heart Failure Clinic Pharmacist [phone]

## 2023-04-14 ENCOUNTER — Other Ambulatory Visit: Payer: Self-pay | Admitting: "Endocrinology

## 2023-04-14 ENCOUNTER — Other Ambulatory Visit (HOSPITAL_COMMUNITY): Payer: Self-pay | Admitting: Unknown Physician Specialty

## 2023-04-14 ENCOUNTER — Telehealth (HOSPITAL_COMMUNITY): Payer: Self-pay | Admitting: Unknown Physician Specialty

## 2023-04-14 DIAGNOSIS — I10 Essential (primary) hypertension: Secondary | ICD-10-CM

## 2023-04-14 DIAGNOSIS — I5022 Chronic systolic (congestive) heart failure: Secondary | ICD-10-CM

## 2023-04-14 DIAGNOSIS — E785 Hyperlipidemia, unspecified: Secondary | ICD-10-CM

## 2023-04-14 DIAGNOSIS — E0590 Thyrotoxicosis, unspecified without thyrotoxic crisis or storm: Secondary | ICD-10-CM

## 2023-04-14 DIAGNOSIS — I479 Paroxysmal tachycardia, unspecified: Secondary | ICD-10-CM

## 2023-04-14 DIAGNOSIS — F32A Depression, unspecified: Secondary | ICD-10-CM

## 2023-04-14 DIAGNOSIS — Z7901 Long term (current) use of anticoagulants: Secondary | ICD-10-CM

## 2023-04-14 DIAGNOSIS — R39198 Other difficulties with micturition: Secondary | ICD-10-CM

## 2023-04-14 DIAGNOSIS — Z95811 Presence of heart assist device: Secondary | ICD-10-CM

## 2023-04-14 MED ORDER — ROSUVASTATIN CALCIUM 20 MG PO TABS
20.0000 mg | ORAL_TABLET | Freq: Every day | ORAL | 3 refills | Status: DC
Start: 2023-04-14 — End: 2023-08-15

## 2023-04-14 MED ORDER — COLCHICINE 0.6 MG PO TABS: 0.6000 mg | ORAL_TABLET | Freq: Every day | ORAL | Status: DC | PRN

## 2023-04-14 MED ORDER — METOPROLOL SUCCINATE ER 25 MG PO TB24
25.0000 mg | ORAL_TABLET | Freq: Every day | ORAL | 6 refills | Status: DC
Start: 2023-04-14 — End: 2023-08-15

## 2023-04-14 MED ORDER — METHIMAZOLE 5 MG PO TABS
ORAL_TABLET | ORAL | 1 refills | Status: DC
Start: 2023-04-14 — End: 2023-08-15

## 2023-04-14 MED ORDER — TRELEGY ELLIPTA 100-62.5-25 MCG/ACT IN AEPB: 1.0000 | INHALATION_SPRAY | Freq: Every day | RESPIRATORY_TRACT | 6 refills | Status: DC

## 2023-04-14 MED ORDER — GLIPIZIDE 5 MG PO TABS: 5.0000 mg | ORAL_TABLET | Freq: Every day | ORAL | 11 refills | Status: DC

## 2023-04-14 MED ORDER — METFORMIN HCL 1000 MG PO TABS: 1000.0000 mg | ORAL_TABLET | Freq: Two times a day (BID) | ORAL | 6 refills | Status: DC

## 2023-04-14 MED ORDER — MIRTAZAPINE 15 MG PO TABS: 15.0000 mg | ORAL_TABLET | Freq: Every day | ORAL | Status: DC

## 2023-04-14 MED ORDER — METHIMAZOLE 5 MG PO TABS
ORAL_TABLET | ORAL | 1 refills | Status: DC
Start: 2023-04-14 — End: 2023-04-14

## 2023-04-14 MED ORDER — TAMSULOSIN HCL 0.4 MG PO CAPS
0.4000 mg | ORAL_CAPSULE | Freq: Every day | ORAL | 3 refills | Status: DC
Start: 2023-04-14 — End: 2023-11-09

## 2023-04-14 MED ORDER — WARFARIN SODIUM 4 MG PO TABS
4.0000 mg | ORAL_TABLET | ORAL | Status: DC
Start: 2023-04-14 — End: 2023-08-15

## 2023-04-14 MED ORDER — SERTRALINE HCL 50 MG PO TABS
50.0000 mg | ORAL_TABLET | Freq: Every day | ORAL | 3 refills | Status: DC
Start: 2023-04-14 — End: 2023-11-09

## 2023-04-14 MED ORDER — LORATADINE 10 MG PO TABS: 10.0000 mg | ORAL_TABLET | Freq: Every day | ORAL | 6 refills | Status: DC

## 2023-04-14 MED ORDER — MINOCYCLINE HCL 100 MG PO CAPS: 200.0000 mg | ORAL_CAPSULE | Freq: Two times a day (BID) | ORAL | 3 refills | Status: DC

## 2023-04-14 MED ORDER — PANTOPRAZOLE SODIUM 40 MG PO TBEC
40.0000 mg | DELAYED_RELEASE_TABLET | Freq: Every day | ORAL | 2 refills | Status: DC
Start: 2023-04-14 — End: 2023-11-09

## 2023-04-14 NOTE — Telephone Encounter (Signed)
Received call from pts wife needing refills for the pt. pt was in hospital and d/c to SNF, due to this pt is now completely out of medications. All medications refilled today per DR Shirlee Latch. Pt was placed on Depakote in SNF for mood stabilization and zoloft was d/c. Per Dr Shirlee Latch pt will need to restart Zoloft 50 mg and stop Depakote. VAD coordinator went over these instructions with the wife and she verbalized understanding.  Carlton Adam RN, BSN VAD Coordinator 24/7 Pager [phone]

## 2023-04-14 NOTE — Addendum Note (Signed)
Addended by: Carlton Adam B on: 04/14/2023 02:24 PM   Modules accepted: Orders

## 2023-04-15 ENCOUNTER — Telehealth (HOSPITAL_COMMUNITY): Payer: Self-pay | Admitting: Licensed Clinical Social Worker

## 2023-04-15 NOTE — Telephone Encounter (Signed)
CSW attempted to reach patient by phone to follow up on substance use. No answer and messages left for return call. CSW will see patient on Monday at appointment in VAD clinic. Lasandra Beech, LCSW, CCSW-MCS [phone]

## 2023-04-18 ENCOUNTER — Other Ambulatory Visit (HOSPITAL_COMMUNITY): Payer: Self-pay | Admitting: *Deleted

## 2023-04-18 DIAGNOSIS — Z95811 Presence of heart assist device: Secondary | ICD-10-CM

## 2023-04-18 DIAGNOSIS — Z7901 Long term (current) use of anticoagulants: Secondary | ICD-10-CM

## 2023-04-21 ENCOUNTER — Ambulatory Visit (HOSPITAL_COMMUNITY): Payer: Self-pay | Admitting: Pharmacist

## 2023-04-21 ENCOUNTER — Encounter (HOSPITAL_COMMUNITY): Payer: Self-pay | Admitting: Cardiology

## 2023-04-21 ENCOUNTER — Other Ambulatory Visit (HOSPITAL_COMMUNITY): Payer: Self-pay

## 2023-04-21 ENCOUNTER — Ambulatory Visit (HOSPITAL_COMMUNITY)
Admission: RE | Admit: 2023-04-21 | Discharge: 2023-04-21 | Disposition: A | Discharge status: Home / Self Care | Payer: 59 | Source: Ambulatory Visit | Attending: Cardiology | Admitting: Cardiology

## 2023-04-21 VITALS — BP 80/0 | HR 78 | Wt 199.4 lb

## 2023-04-21 DIAGNOSIS — D500 Iron deficiency anemia secondary to blood loss (chronic): Secondary | ICD-10-CM | POA: Insufficient documentation

## 2023-04-21 DIAGNOSIS — I480 Paroxysmal atrial fibrillation: Secondary | ICD-10-CM | POA: Diagnosis not present

## 2023-04-21 DIAGNOSIS — M109 Gout, unspecified: Secondary | ICD-10-CM | POA: Diagnosis present

## 2023-04-21 DIAGNOSIS — I130 Hypertensive heart and chronic kidney disease with heart failure and stage 1 through stage 4 chronic kidney disease, or unspecified chronic kidney disease: Secondary | ICD-10-CM | POA: Diagnosis not present

## 2023-04-21 DIAGNOSIS — F1510 Other stimulant abuse, uncomplicated: Secondary | ICD-10-CM | POA: Diagnosis not present

## 2023-04-21 DIAGNOSIS — E1122 Type 2 diabetes mellitus with diabetic chronic kidney disease: Secondary | ICD-10-CM | POA: Diagnosis not present

## 2023-04-21 DIAGNOSIS — I5022 Chronic systolic (congestive) heart failure: Secondary | ICD-10-CM | POA: Insufficient documentation

## 2023-04-21 DIAGNOSIS — Z95811 Presence of heart assist device: Secondary | ICD-10-CM

## 2023-04-21 DIAGNOSIS — Z79899 Other long term (current) drug therapy: Secondary | ICD-10-CM | POA: Diagnosis not present

## 2023-04-21 DIAGNOSIS — I493 Ventricular premature depolarization: Secondary | ICD-10-CM | POA: Insufficient documentation

## 2023-04-21 DIAGNOSIS — I472 Ventricular tachycardia, unspecified: Secondary | ICD-10-CM | POA: Diagnosis not present

## 2023-04-21 DIAGNOSIS — E0590 Thyrotoxicosis, unspecified without thyrotoxic crisis or storm: Secondary | ICD-10-CM | POA: Diagnosis not present

## 2023-04-21 DIAGNOSIS — J449 Chronic obstructive pulmonary disease, unspecified: Secondary | ICD-10-CM | POA: Insufficient documentation

## 2023-04-21 DIAGNOSIS — I2510 Atherosclerotic heart disease of native coronary artery without angina pectoris: Secondary | ICD-10-CM | POA: Insufficient documentation

## 2023-04-21 DIAGNOSIS — N183 Chronic kidney disease, stage 3 unspecified: Secondary | ICD-10-CM | POA: Insufficient documentation

## 2023-04-21 DIAGNOSIS — Z7901 Long term (current) use of anticoagulants: Secondary | ICD-10-CM

## 2023-04-21 DIAGNOSIS — Z951 Presence of aortocoronary bypass graft: Secondary | ICD-10-CM | POA: Insufficient documentation

## 2023-04-21 DIAGNOSIS — G4700 Insomnia, unspecified: Secondary | ICD-10-CM

## 2023-04-21 DIAGNOSIS — Z87891 Personal history of nicotine dependence: Secondary | ICD-10-CM | POA: Diagnosis not present

## 2023-04-21 DIAGNOSIS — F32A Depression, unspecified: Secondary | ICD-10-CM

## 2023-04-21 LAB — BASIC METABOLIC PANEL
Anion gap: 11 (ref 5–15)
BUN: 23 mg/dL (ref 8–23)
CO2: 23 mmol/L (ref 22–32)
Calcium: 8.6 mg/dL — ABNORMAL LOW (ref 8.9–10.3)
Chloride: 103 mmol/L (ref 98–111)
Creatinine, Ser: 1.19 mg/dL (ref 0.61–1.24)
GFR, Estimated: >60 mL/min (ref >60)
Glucose, Bld: 143 mg/dL — ABNORMAL HIGH (ref 70–99)
Potassium: 4.5 mmol/L (ref 3.5–5.1)
Sodium: 137 mmol/L (ref 135–145)

## 2023-04-21 LAB — CBC
HCT: 40.7 % (ref 39.0–52.0)
Hemoglobin: 13 g/dL (ref 13.0–17.0)
MCH: 27 pg (ref 26.0–34.0)
MCHC: 31.9 g/dL (ref 30.0–36.0)
MCV: 84.6 fL (ref 80.0–100.0)
Platelets: 226 10*3/uL (ref 150–400)
RBC: 4.81 MIL/uL (ref 4.22–5.81)
RDW: 17 % — ABNORMAL HIGH (ref 11.5–15.5)
WBC: 17.6 10*3/uL — ABNORMAL HIGH (ref 4.0–10.5)
nRBC: 0 % (ref 0.0–0.2)

## 2023-04-21 LAB — LACTATE DEHYDROGENASE: LDH: 209 U/L — ABNORMAL HIGH (ref 98–192)

## 2023-04-21 LAB — PROTIME-INR
INR: 1.4 — ABNORMAL HIGH (ref 0.8–1.2)
Prothrombin Time: 17.6 seconds — ABNORMAL HIGH (ref 11.4–15.2)

## 2023-04-21 LAB — TSH: TSH: 5.614 u[IU]/mL — ABNORMAL HIGH (ref 0.350–4.500)

## 2023-04-21 NOTE — Progress Notes (Addendum)
Patient presents for 1 month follow up with wife [name]. Patient recently discharged following admission for drive line infection with debridement and wound vac placement.   Denies lightheadedness, dizziness, falls and signs of bleeding. Complains of overall fatigue over the last week. Pt is very flat today on exam. Denies drug use at this time. Pt was placed on Depakote in SNF for mood stabilization and Zoloft was d/c. Zoloft 50 mg restarted 5/6 and Depakote stopped per Dr Shirlee Latch. Referral sent to outpatient psych in [place] for further assistance with medication regimen for depression and mood stabilization.  Pt and wife report compliance with Minocycline. Denies missing any doses. Kennith Center reports she is changing dressing once daily. Dressing changed today with Dr. Maren Beach. See below for details.  TSH added on to labs today. Pt no showed Endocrinology appointment 4/9. Pt asked to reschedule appointment.  Will schedule ramp echo with next appointment per Dr. Shirlee Latch.  Addendum: WBC 17.6 today. Discussed with Dr. Shirlee Latch. Orders placed for blood cultures and CXR at [name]. Tracey verbalized understanding of orders and states that pt agreeable to go tomorrow. VAD Coordinator advised for Kennith Center to page VAD Coordinator if symptoms worsen.  Vital Signs:  Doppler Pressure: 80 Automatic BP: 88/57 (70) HR: 78 SPO2: 99    Weight: 199.4 lb w/o ept Last weight: 209.4 lb w/ ept  VAD Indication: Destination Therapy. HM III implanted 11/17/20 by Dr Vickey Sages   VAD interrogation: Speed: 5600 Flow: 4.3 Power: 4.1 w    PI: 3.1 Hct: 30  Alarms: none  Events: 100+  Fixed speed: 5500 Low speed limit: 5200  Primary controller: replace back up battery in 29 months Back up controller: replace back up battery in 17 months   I reviewed the LVAD parameters from today and compared the results to the patient's prior recorded data. LVAD interrogation was NEGATIVE for significant power changes, NEGATIVE  for clinical alarms and STABLE for PI events/speed drops. No programming changes were made and pump is functioning within specified parameters. Pt is performing daily controller and system monitor self tests along with completing weekly and monthly maintenance for LVAD equipment.   LVAD equipment check completed and is in good working order. Back-up equipment not present.    Annual Equipment Maintenance on UBC/PM was performed today on 01/15/2023.  Exit Site Care: Existing VAD dressing removed and site care performed using sterile technique by Dr. Maren Beach. VASHE solution to wound bed. Debride with dry 2 x 2. Drive line exit site cleaned with Chlora prep applicators x 2, allowed to dry. Vashe soaked 2x2 gauze placed in would bed, and covered with dry gauze. Vashe soaked 2 x 2 packed in tunneled area. Entire dressing covered with large tegaderm. Moderate amount of serous drainage noted on previous dressing. No redness, foul odor, or tenderness noted. Anchor replaced. Daily sterile wet to dry dressing changes per Kennith Center. Provided with 14 daily kits, 14 anchors  and 2 boxes of large tegaderms for home use.      BP & Labs:  Doppler 80- Doppler reflecting modified systolic   Hgb 13.0 - No S/S of bleeding. Specifically denies melena/BRBPR or nosebleeds. Occasional blood when wiping.    LDH 209 -  with established baseline of 190 - 360. Denies tea-colored urine. No power elevations noted on interrogation.    Plan: Return to VAD Clinic in 2 weeks for dressing with Dr.Vantrigt Return to VAD Clinic in 1 month for follow up with Dr. Shirlee Latch 3. Coumadin dosing per Leotis Shames RPH 4.  Reschedule appointment with Dr. Fransico Him Endocrinology [phone]  Simmie Davies RN,BSN VAD Coordinator  Office: [phone]  24/7 Pager: [phone]   HF cardiologist: Dr. Shirlee Latch  Follow up for Heart Failure/LVAD:  62 y.o. 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 type 2 diabetes mellitus, COPD, and hypertension. Now s/p Heartmate 3 LVAD.   Patient had echo in 10/21 with EF 20-25%.  He was admitted in 12/21 with cardiogenic shock.  Cath showed patent grafts and low cardiac output.  Suspect mixed ischemic/nonischemic cardiomyopathy, prior heavy ETOH though no ETOH or smoking since 12/20 CABG.  He required stabilization with Impella 5.5 and ultimately had placement of Heartmate 3 LVAD.  Paroxysmal atrial fibrillation noted post-op, but generally stable course and discharged home.    Patient was admitted in 10/22 with COPD exacerbation.  [name] was stopped with frequent PIs.  He was treated with steroids, nebs, antibiotics with improvement.   Ramp echo was done in 2/23.  At 5600 rpm the LV was small and the septum with some left shift, aortic valve opening 3/5 beats.  At 5500 rpm, the LV was larger and septum more midline, aortic valve still opening 3/5 beats.  The RV was moderately enlarged with normal systolic function. I left speed at 5500 rpm.   Patient had a prolonged admission in 6/23 and again in 7/23-8/23 with MRSA driveline infection.  He went to the OR multiple times for debridement and wound vac changes.  He completed vancomycin and was placed on doxycycline.   Levofloxacin added d/t increased drainage and redness after last f/u on 09/04/22. Wound culture grew acinetobacter.  Wound initially improved but cultures repeated d/t decreased healing over last few dressing changes. Levofloxacin recently stopped and back on doxy.  Grew acinetobacter and MRSA on culture. ICD recommended switching to minocycline 200 BID based on culture results.  Patient was admitted with swollen and erythematous right wrist in 10/23.  There was concern for septic arthritis. CT showed no effusion that could be tapped.  Suspect this was likely a gout flare as it improved  rapidly with colchicine.    Patient was admitted with recurrent MRSA driveline site infection in 2/24.  He went to the OR several times for debridement and wound vac management. BP and creatinine labile, we ended up stopping Lasix and anti-hypertensives except for Toprol XL.   He returns for LVAD followup.  Now at home from SNF.  He is on minocycline long-term.  He missed his endocrine appointment, still taking methimazole for hyperthyroidism.  Flat affect, says he is not able to do anything he wants to do now, does not like to go out during the day with his LVAD.  Scared of getting another driveline infection.  Denies current drug use and is not smoking.  Saw a psychiatrist at the SNF when he was there, now on Zoloft.  Complains of fatigue, not short of breath walking around on flat ground currently.  Driveline is healing nicely. Weight down 10 days.   Labs (10/23): K 4.4, creatinine 0.94, hgb 10.9, LDH 145 Labs (3/24): creatinine 1.23 Labs (4/24): K 4.8, creatinine 1.25 Labs (5/24): WBCs 17.6, hgb 13   Past Medical History:  Diagnosis Date   "    Arthritis    CAD (coronary artery disease)    a. s/p CABG in 11/2019 with LIMA-LAD, SVG-OM1, SVG-PDA and SVG-D1  CHF (congestive heart failure) (HCC)    a. EF < 20% by echo in 11/2019   Essential hypertension    PAF (paroxysmal atrial fibrillation) (HCC)    Type 2 diabetes mellitus (HCC)     Current Outpatient Medications  Medication Sig Dispense Refill   acetaminophen (TYLENOL) 325 MG tablet Take 2 tablets (650 mg total) by mouth every 4 (four) hours as needed for headache or mild pain.     albuterol (PROVENTIL) (2.5 MG/3ML) 0.083% nebulizer solution INHALE 3 ML BY NEBULIZATION EVERY 6 HOURS AS NEEDED FOR WHEEZING OR SHORTNESS OF BREATH 450 mL 1   albuterol (VENTOLIN HFA) 108 (90 Base) MCG/ACT inhaler Inhale 2 puffs into the lungs every 6 (six) hours as needed for wheezing or shortness of breath.     allopurinol (ZYLOPRIM) 100 MG tablet  Take 1 tablet (100 mg total) by mouth daily. 90 tablet 3   colchicine 0.6 MG tablet Take 1 tablet (0.6 mg total) by mouth daily as needed (gout pain).     COMBIVENT RESPIMAT 20-100 MCG/ACT AERS respimat Inhale 1 puff into the lungs every 6 (six) hours.     feeding supplement, GLUCERNA SHAKE, (GLUCERNA SHAKE) LIQD Take 237 mLs by mouth 3 (three) times daily with meals.  0   fluticasone (FLONASE ALLERGY RELIEF) 50 MCG/ACT nasal spray Place 2 sprays into both nostrils as needed for allergies or rhinitis.     glipiZIDE (GLUCOTROL) 5 MG tablet Take 1 tablet (5 mg total) by mouth daily. 30 tablet 11   loratadine (CLARITIN) 10 MG tablet Take 1 tablet (10 mg total) by mouth daily. 30 tablet 6   metFORMIN (GLUCOPHAGE) 1000 MG tablet Take 1 tablet (1,000 mg total) by mouth 2 (two) times daily. 120 tablet 6   methimazole (TAPAZOLE) 5 MG tablet TAKE 1 TABLET BY MOUTH EVERY DAY WITH BREAKFAST Strength: 5 mg 90 tablet 1   metoprolol succinate (TOPROL XL) 25 MG 24 hr tablet Take 1 tablet (25 mg total) by mouth daily. 30 tablet 6   minocycline (MINOCIN) 100 MG capsule Take 2 capsules (200 mg total) by mouth 2 (two) times daily. 120 capsule 3   mirtazapine (REMERON) 15 MG tablet Take 1 tablet (15 mg total) by mouth at bedtime.     Multiple Vitamin (MULTIVITAMIN WITH MINERALS) TABS tablet Take 1 tablet by mouth daily.     Multiple Vitamin (MULTIVITAMIN) tablet Take 1 tablet by mouth daily.     pantoprazole (PROTONIX) 40 MG tablet Take 1 tablet (40 mg total) by mouth daily. 90 tablet 2   rosuvastatin (CRESTOR) 20 MG tablet Take 1 tablet (20 mg total) by mouth daily. 30 tablet 3   sertraline (ZOLOFT) 50 MG tablet Take 1 tablet (50 mg total) by mouth daily. 90 tablet 3   tamsulosin (FLOMAX) 0.4 MG CAPS capsule Take 1 capsule (0.4 mg total) by mouth daily. 90 capsule 3   thiamine (VITAMIN B-1) 100 MG tablet Take 1 tablet (100 mg total) by mouth daily.     traZODone (DESYREL) 150 MG tablet Take 1 tablet (150 mg total)  by mouth at bedtime. 90 tablet 3   TRELEGY ELLIPTA 100-62.5-25 MCG/ACT AEPB Inhale 1 puff into the lungs daily. 1 each 6   triamcinolone ointment (KENALOG) 0.5 % Apply 1 Application topically daily. Apply to affected area during wound care daily 30 g 1   warfarin (COUMADIN) 4 MG tablet Take 1-1.5 tablets (4-6 mg total) by mouth See admin instructions. 4 mg every Tuesday and Thursday. 6  mg all other days     zinc sulfate 220 (50 Zn) MG capsule Take 1 capsule (220 mg total) by mouth daily.     clonazePAM (KLONOPIN) 0.25 MG disintegrating tablet Take 1 tablet (0.25 mg total) by mouth 2 (two) times daily. (Patient not taking: Reported on 04/21/2023) 60 tablet 1   No current facility-administered medications for this encounter.    Vancomycin  REVIEW OF SYSTEMS: All systems negative except as listed in HPI, PMH and Problem list.   LVAD INTERROGATION:  Please see LVAD nurse's note for interrogation.   I reviewed the LVAD parameters from today, and compared the results to the patient's prior recorded data.  No programming changes were made.  The LVAD is functioning within specified parameters.  The patient performs LVAD self-test daily.  LVAD interrogation was negative for any significant power changes, alarms or PI events/speed drops.  LVAD equipment check completed and is in good working order.  Back-up equipment present.   LVAD education done on emergency procedures and precautions and reviewed exit site care.    Vitals:   04/21/23 1514 04/21/23 1515  BP: (!) 88/57 (!) 80/0  Pulse: 78   SpO2: 98%   Weight: 90.4 kg (199 lb 6.4 oz)   MAP 70  Physical Exam: General: Well appearing this am. NAD.  HEENT: Normal. Neck: Supple, JVP 7-8 cm. Carotids OK.  Cardiac:  Mechanical heart sounds with LVAD hum present.  Lungs:  Mildly distant BS.  Abdomen:  NT, ND, no HSM. No bruits or masses. +BS  LVAD exit site: Well-healed and incorporated. Dressing dry and intact. No erythema or drainage.  Stabilization device present and accurately applied. Driveline dressing changed daily per sterile technique. Extremities:  Warm and dry. No cyanosis, clubbing, rash, or edema.  Neuro:  Alert & oriented x 3. Cranial nerves grossly intact. Moves all 4 extremities w/o difficulty. Affect pleasant    ASSESSMENT AND PLAN: 1.  Gout: Continue allopurinol, he has prn colchicine.  2.  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  Cultures have grown acinetobacter and MRSA. He was readmitted with driveline infection in 2/24, completed course of daptomycin for MRSA. Driveline site looks good today.  - Continue long-term on minocycline.    3.  Chronic systolic CHF: Echo 10/21 with EF 20-25%, mildly decreased RV function. LHC/RHC in 12/21 with patent grafts, low output. Suspect mixed ischemic/nonischemic cardiomyopathy (prior heavy ETOH and drugs as well as CAD).  No ETOH, drugs, smoking since CABG in 12/20. Admitted with cardiogenic shock in 12/21, had placement of Impella 5.5 initially, now s/p Heartmate 3 LVAD on 11/17/20.  Ramp echo 2/23 with speed decreased to 5500 rpm. He would eventually like heart transplant. Speed now back to 5600 rpm.  MAP not elevated today.  He is not volume overloaded on exam.  MAP has been labile, creatinine has been labile.     - Continue current Toprol XL 25 mg daily.  - He does not need a diuretic.   - Continue warfarin, INR goal 2-2.5.  4. 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  - Would consider eventual reassessment of renal artery stenosis and possible treatment  - BMET today.  5.  CAD: S/p CABG 12/20.  LHC pre-VAD with patent grafts, no target for intervention. - Continue Crestor 20 mg daily.  6.  Atrial fibrillation: Paroxysmal.  S/p Maze  and LA appendage clip with CABG in 12/20. Maintaining SR.  - Continue Toprol  XL 25 mg daily.  - Off amiodarone with hyperthyroidism. 7. Type 2 diabetes: Per PCP.  8. 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 Strongly encouraged abstinence.  9. 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  Followed by endocrinology. He is now off amiodarone.  - Continue methimazole  - Needs endocrinology followup, missed last appointment and will have to reschedule.   - Check TSH, free T3, free T4 today.  10. Anemia (chronic blood loss): CBC today.   11. COPD: Prior smoker.  Not wheezing today.  - Continue Trelegy.  - Needs followup with pulmonary, we have referred him to [facility] in [place].  12. PVCs/NSVT: Now off amiodarone.  13. Depression: Patient has a flat affect, reports a lack of interest in most activities.  Not enjoying life.  Has turned to substance abuse to cope.   - Refer to psychiatry in [place], he is willing to go.   Followup 2 months with echo.   Marca Ancona 04/21/2023

## 2023-04-21 NOTE — Patient Instructions (Addendum)
Return in 2 weeks for dressing change with Dr. Maren Beach Outpatient psych referral sent Return to VAD Clinic in 2 months for ramp echo and appointment with Dr. Shirlee Latch Coumadin dosing per Leotis Shames [facility] 5.  Reschedule appointment with Dr. Fransico Him Endocrinology [phone]

## 2023-04-21 NOTE — Progress Notes (Signed)
CSW met with patient in clinic. CSW discussed substance use and patient adamant denies any recent use. He looked to his wife during conversation for her to acknowledge his statements and she was silent. He states "don't need AA/NA or any help because I am not using". Patient shred that "I never should have done this because I can't do anything like I used to". He shared he can't work and doesn't want to go out in public during the day. He shared that he will only go out and sit on the porch after dark. He states that he doesn't like to switch to batteries "because I don't want to have to carry all of this around". CSW made some suggestions for equipment wearables such as compression shirt and also suggested talking with another VAD patient about life with VAD to help with motivation and improve his spirits. Patient adamantly denied and stated "I will just stay in the house to avoid getting any infections because I don't want to end up back here". Patient later agreed to Dr Shirlee Latch to go for mental health counseling. CSW confirmed patient agreeable and will refer to [facility] [place]. Lasandra Beech, LCSW, CCSW-MCS [phone]

## 2023-04-22 ENCOUNTER — Ambulatory Visit (HOSPITAL_COMMUNITY)
Admission: RE | Admit: 2023-04-22 | Discharge: 2023-04-22 | Disposition: A | Discharge status: Home / Self Care | Payer: 59 | Source: Ambulatory Visit | Attending: Cardiology | Admitting: Cardiology

## 2023-04-22 ENCOUNTER — Other Ambulatory Visit (HOSPITAL_COMMUNITY)
Admission: RE | Admit: 2023-04-22 | Discharge: 2023-04-22 | Disposition: A | Discharge status: Home / Self Care | Payer: 59 | Source: Ambulatory Visit | Attending: Cardiology | Admitting: Cardiology

## 2023-04-22 DIAGNOSIS — Z7901 Long term (current) use of anticoagulants: Secondary | ICD-10-CM | POA: Insufficient documentation

## 2023-04-22 DIAGNOSIS — Z95811 Presence of heart assist device: Secondary | ICD-10-CM

## 2023-04-25 LAB — CULTURE, BLOOD (SINGLE)

## 2023-04-26 LAB — CULTURE, BLOOD (SINGLE)

## 2023-04-27 LAB — CULTURE, BLOOD (SINGLE)
Culture: NO GROWTH
Culture: NO GROWTH

## 2023-05-07 ENCOUNTER — Other Ambulatory Visit (HOSPITAL_COMMUNITY): Payer: 59

## 2023-05-13 ENCOUNTER — Telehealth (HOSPITAL_COMMUNITY): Payer: Self-pay | Admitting: *Deleted

## 2023-05-13 NOTE — Telephone Encounter (Signed)
Received call from [facility] stating pt is refusing to come to clinic on Thursday. States she will call and reschedule his appt when he is willing to come to clinic.   Tracey plans to come to clinic to pick up dressing supplies (daily kits, anchors, large tegaderms, and Vashe solution).   Alyce Pagan RN VAD Coordinator  Office: [phone]  24/7 Pager: [phone]

## 2023-05-15 ENCOUNTER — Other Ambulatory Visit (HOSPITAL_COMMUNITY): Payer: 59

## 2023-06-07 ENCOUNTER — Other Ambulatory Visit: Payer: Self-pay | Admitting: "Endocrinology

## 2023-06-07 DIAGNOSIS — E119 Type 2 diabetes mellitus without complications: Secondary | ICD-10-CM

## 2023-06-27 ENCOUNTER — Other Ambulatory Visit (HOSPITAL_COMMUNITY): Payer: Self-pay | Admitting: *Deleted

## 2023-06-27 DIAGNOSIS — Z7901 Long term (current) use of anticoagulants: Secondary | ICD-10-CM

## 2023-06-27 DIAGNOSIS — Z95811 Presence of heart assist device: Secondary | ICD-10-CM

## 2023-07-01 ENCOUNTER — Ambulatory Visit (HOSPITAL_COMMUNITY): Payer: 59

## 2023-07-01 ENCOUNTER — Telehealth (HOSPITAL_COMMUNITY): Payer: Self-pay

## 2023-07-01 ENCOUNTER — Encounter (HOSPITAL_COMMUNITY): Payer: 59 | Admitting: Cardiology

## 2023-07-01 NOTE — Telephone Encounter (Signed)
VAD Coordinator received phone call from pt's wife [name] this morning to cancel his appointment upon pt's request. She states he "is in a mood and won't get out of bed". VAD Coordinator stressed the importance of attending scheduled appointments for his safety. Pt has not had INR drawn since 5/13 and he has not used his home INR machine since 11/2022 therefore his account is inactive. [name] states that supplies were lost when they moved and she has lost the contact information the reorder. VAD Coordinator discussed with Dr. Shirlee Latch. MD advises for pt to rescheduled as soon as possible and to re-enroll in [facility] for home INR checks. [name] states she will discuss with pt when he would like to reschedule and call back. This VAD Coordinator faxed [employer] enrollment form and once approved they will reach out to pt for education and to see what supplies are needed. Valinda Hoar to please submit INR as soon as possible once enrollment is complete.  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 She is having to change his dressing 2-3 times daily as he is showering when he is not suppose to as well. She requests more dressing kits today. VAD Coordinator supplied her with 14 daily kits, 14 anchors, VASHE and 2 boxes of large tegaderms to pick up for home use.   [name] also asks for assistance in paying their electric bill. Offered for her to speak with Annice Pih CSW and she states she is on her way to the grocery store and cannot talk at this time. VAD Coordinator offered to speak in person when she arrives to pick up dressing supplies and call was disconnected. Attempted to call back x 2 with no answer. VM left with instructions to ask for VAD Coordinator upon arrival to discuss further.  VAD Coordinator will continue to follow up to reschedule appointment.   Simmie Davies RN, BSN VAD Coordinator 24/7 Pager [phone]

## 2023-07-07 ENCOUNTER — Encounter (HOSPITAL_COMMUNITY): Payer: Self-pay

## 2023-07-07 ENCOUNTER — Inpatient Hospital Stay (HOSPITAL_COMMUNITY)
Admission: AD | Admit: 2023-07-07 | Discharge: 2023-08-15 | DRG: 264 | Disposition: A | Discharge status: Home Health | Payer: 59 | Source: Ambulatory Visit | Attending: Internal Medicine | Admitting: Internal Medicine

## 2023-07-07 ENCOUNTER — Inpatient Hospital Stay (HOSPITAL_COMMUNITY): Payer: 59

## 2023-07-07 ENCOUNTER — Ambulatory Visit (HOSPITAL_COMMUNITY)
Admission: RE | Admit: 2023-07-07 | Discharge: 2023-07-07 | Disposition: A | Discharge status: Home / Self Care | Payer: 59 | Source: Ambulatory Visit | Attending: Cardiology | Admitting: Cardiology

## 2023-07-07 VITALS — BP 94/0 | HR 80 | Temp 98.2°F

## 2023-07-07 DIAGNOSIS — Z452 Encounter for adjustment and management of vascular access device: Secondary | ICD-10-CM | POA: Insufficient documentation

## 2023-07-07 DIAGNOSIS — Y831 Surgical operation with implant of artificial internal device as the cause of abnormal reaction of the patient, or of later complication, without mention of misadventure at the time of the procedure: Secondary | ICD-10-CM | POA: Diagnosis present

## 2023-07-07 DIAGNOSIS — Z8614 Personal history of Methicillin resistant Staphylococcus aureus infection: Secondary | ICD-10-CM

## 2023-07-07 DIAGNOSIS — I2510 Atherosclerotic heart disease of native coronary artery without angina pectoris: Secondary | ICD-10-CM | POA: Diagnosis present

## 2023-07-07 DIAGNOSIS — I472 Ventricular tachycardia, unspecified: Secondary | ICD-10-CM | POA: Diagnosis present

## 2023-07-07 DIAGNOSIS — E1122 Type 2 diabetes mellitus with diabetic chronic kidney disease: Secondary | ICD-10-CM | POA: Diagnosis present

## 2023-07-07 DIAGNOSIS — Z7901 Long term (current) use of anticoagulants: Secondary | ICD-10-CM

## 2023-07-07 DIAGNOSIS — Z951 Presence of aortocoronary bypass graft: Secondary | ICD-10-CM

## 2023-07-07 DIAGNOSIS — I255 Ischemic cardiomyopathy: Secondary | ICD-10-CM | POA: Diagnosis present

## 2023-07-07 DIAGNOSIS — T827XXA Infection and inflammatory reaction due to other cardiac and vascular devices, implants and grafts, initial encounter: Secondary | ICD-10-CM | POA: Insufficient documentation

## 2023-07-07 DIAGNOSIS — I130 Hypertensive heart and chronic kidney disease with heart failure and stage 1 through stage 4 chronic kidney disease, or unspecified chronic kidney disease: Secondary | ICD-10-CM | POA: Diagnosis present

## 2023-07-07 DIAGNOSIS — Z881 Allergy status to other antibiotic agents status: Secondary | ICD-10-CM

## 2023-07-07 DIAGNOSIS — B965 Pseudomonas (aeruginosa) (mallei) (pseudomallei) as the cause of diseases classified elsewhere: Secondary | ICD-10-CM | POA: Diagnosis present

## 2023-07-07 DIAGNOSIS — Z8261 Family history of arthritis: Secondary | ICD-10-CM

## 2023-07-07 DIAGNOSIS — Z825 Family history of asthma and other chronic lower respiratory diseases: Secondary | ICD-10-CM

## 2023-07-07 DIAGNOSIS — Z7984 Long term (current) use of oral hypoglycemic drugs: Secondary | ICD-10-CM

## 2023-07-07 DIAGNOSIS — M109 Gout, unspecified: Secondary | ICD-10-CM | POA: Diagnosis present

## 2023-07-07 DIAGNOSIS — R296 Repeated falls: Secondary | ICD-10-CM | POA: Diagnosis present

## 2023-07-07 DIAGNOSIS — R5383 Other fatigue: Secondary | ICD-10-CM | POA: Insufficient documentation

## 2023-07-07 DIAGNOSIS — F1510 Other stimulant abuse, uncomplicated: Secondary | ICD-10-CM | POA: Diagnosis present

## 2023-07-07 DIAGNOSIS — R42 Dizziness and giddiness: Secondary | ICD-10-CM | POA: Insufficient documentation

## 2023-07-07 DIAGNOSIS — Z23 Encounter for immunization: Secondary | ICD-10-CM

## 2023-07-07 DIAGNOSIS — I5023 Acute on chronic systolic (congestive) heart failure: Secondary | ICD-10-CM | POA: Diagnosis not present

## 2023-07-07 DIAGNOSIS — Y718 Miscellaneous cardiovascular devices associated with adverse incidents, not elsewhere classified: Secondary | ICD-10-CM | POA: Insufficient documentation

## 2023-07-07 DIAGNOSIS — Z79899 Other long term (current) drug therapy: Secondary | ICD-10-CM

## 2023-07-07 DIAGNOSIS — E0590 Thyrotoxicosis, unspecified without thyrotoxic crisis or storm: Secondary | ICD-10-CM | POA: Diagnosis present

## 2023-07-07 DIAGNOSIS — Z87891 Personal history of nicotine dependence: Secondary | ICD-10-CM | POA: Diagnosis not present

## 2023-07-07 DIAGNOSIS — Z91199 Patient's noncompliance with other medical treatment and regimen due to unspecified reason: Secondary | ICD-10-CM

## 2023-07-07 DIAGNOSIS — I480 Paroxysmal atrial fibrillation: Secondary | ICD-10-CM | POA: Diagnosis present

## 2023-07-07 DIAGNOSIS — K432 Incisional hernia without obstruction or gangrene: Secondary | ICD-10-CM | POA: Diagnosis present

## 2023-07-07 DIAGNOSIS — J449 Chronic obstructive pulmonary disease, unspecified: Secondary | ICD-10-CM | POA: Diagnosis present

## 2023-07-07 DIAGNOSIS — D500 Iron deficiency anemia secondary to blood loss (chronic): Secondary | ICD-10-CM | POA: Diagnosis present

## 2023-07-07 DIAGNOSIS — F32A Depression, unspecified: Secondary | ICD-10-CM | POA: Diagnosis present

## 2023-07-07 DIAGNOSIS — Z95811 Presence of heart assist device: Secondary | ICD-10-CM

## 2023-07-07 DIAGNOSIS — I5022 Chronic systolic (congestive) heart failure: Secondary | ICD-10-CM | POA: Diagnosis present

## 2023-07-07 DIAGNOSIS — T82518A Breakdown (mechanical) of other cardiac and vascular devices and implants, initial encounter: Secondary | ICD-10-CM | POA: Diagnosis present

## 2023-07-07 DIAGNOSIS — Z836 Family history of other diseases of the respiratory system: Secondary | ICD-10-CM

## 2023-07-07 DIAGNOSIS — Y838 Other surgical procedures as the cause of abnormal reaction of the patient, or of later complication, without mention of misadventure at the time of the procedure: Secondary | ICD-10-CM | POA: Insufficient documentation

## 2023-07-07 DIAGNOSIS — E785 Hyperlipidemia, unspecified: Secondary | ICD-10-CM

## 2023-07-07 DIAGNOSIS — R451 Restlessness and agitation: Secondary | ICD-10-CM | POA: Diagnosis not present

## 2023-07-07 DIAGNOSIS — I493 Ventricular premature depolarization: Secondary | ICD-10-CM | POA: Diagnosis present

## 2023-07-07 DIAGNOSIS — T80212A Local infection due to central venous catheter, initial encounter: Secondary | ICD-10-CM | POA: Diagnosis not present

## 2023-07-07 DIAGNOSIS — T829XXD Unspecified complication of cardiac and vascular prosthetic device, implant and graft, subsequent encounter: Secondary | ICD-10-CM

## 2023-07-07 DIAGNOSIS — K5900 Constipation, unspecified: Secondary | ICD-10-CM | POA: Diagnosis not present

## 2023-07-07 DIAGNOSIS — Z833 Family history of diabetes mellitus: Secondary | ICD-10-CM

## 2023-07-07 DIAGNOSIS — N183 Chronic kidney disease, stage 3 unspecified: Secondary | ICD-10-CM | POA: Diagnosis present

## 2023-07-07 DIAGNOSIS — Z8249 Family history of ischemic heart disease and other diseases of the circulatory system: Secondary | ICD-10-CM

## 2023-07-07 DIAGNOSIS — B9561 Methicillin susceptible Staphylococcus aureus infection as the cause of diseases classified elsewhere: Secondary | ICD-10-CM | POA: Diagnosis present

## 2023-07-07 DIAGNOSIS — Z7951 Long term (current) use of inhaled steroids: Secondary | ICD-10-CM

## 2023-07-07 DIAGNOSIS — I25110 Atherosclerotic heart disease of native coronary artery with unstable angina pectoris: Secondary | ICD-10-CM | POA: Diagnosis not present

## 2023-07-07 DIAGNOSIS — T462X5A Adverse effect of other antidysrhythmic drugs, initial encounter: Secondary | ICD-10-CM | POA: Diagnosis present

## 2023-07-07 LAB — AEROBIC CULTURE W GRAM STAIN (SUPERFICIAL SPECIMEN)

## 2023-07-07 LAB — LACTATE DEHYDROGENASE: LDH: 126 U/L (ref 98–192)

## 2023-07-07 LAB — COMPREHENSIVE METABOLIC PANEL
ALT: 24 U/L (ref 0–44)
AST: 17 U/L (ref 15–41)
Albumin: 3 g/dL — ABNORMAL LOW (ref 3.5–5.0)
Alkaline Phosphatase: 93 U/L (ref 38–126)
Anion gap: 10 (ref 5–15)
BUN: 24 mg/dL — ABNORMAL HIGH (ref 8–23)
CO2: 23 mmol/L (ref 22–32)
Calcium: 8.6 mg/dL — ABNORMAL LOW (ref 8.9–10.3)
Chloride: 103 mmol/L (ref 98–111)
Creatinine, Ser: 1.25 mg/dL — ABNORMAL HIGH (ref 0.61–1.24)
GFR, Estimated: >60 mL/min (ref >60)
Glucose, Bld: 200 mg/dL — ABNORMAL HIGH (ref 70–99)
Potassium: 3.7 mmol/L (ref 3.5–5.1)
Sodium: 136 mmol/L (ref 135–145)
Total Bilirubin: 0.3 mg/dL (ref 0.3–1.2)
Total Protein: 6 g/dL — ABNORMAL LOW (ref 6.5–8.1)

## 2023-07-07 LAB — SURGICAL PCR SCREEN
MRSA, PCR: NEGATIVE
Staphylococcus aureus: NEGATIVE

## 2023-07-07 LAB — CBC
HCT: 35.6 % — ABNORMAL LOW (ref 39.0–52.0)
Hemoglobin: 11.8 g/dL — ABNORMAL LOW (ref 13.0–17.0)
MCH: 28.3 pg (ref 26.0–34.0)
MCHC: 33.1 g/dL (ref 30.0–36.0)
MCV: 85.4 fL (ref 80.0–100.0)
Platelets: 199 10*3/uL (ref 150–400)
RBC: 4.17 MIL/uL — ABNORMAL LOW (ref 4.22–5.81)
RDW: 14.3 % (ref 11.5–15.5)
WBC: 7.8 10*3/uL (ref 4.0–10.5)
nRBC: 0 % (ref 0.0–0.2)

## 2023-07-07 LAB — T4, FREE: Free T4: 0.87 ng/dL (ref 0.61–1.12)

## 2023-07-07 LAB — MAGNESIUM: Magnesium: 1.6 mg/dL — ABNORMAL LOW (ref 1.7–2.4)

## 2023-07-07 LAB — TSH: TSH: 7.452 u[IU]/mL — ABNORMAL HIGH (ref 0.350–4.500)

## 2023-07-07 LAB — PREALBUMIN: Prealbumin: 24 mg/dL (ref 18–38)

## 2023-07-07 LAB — PROTIME-INR
INR: 1 (ref 0.8–1.2)
Prothrombin Time: 13.6 seconds (ref 11.4–15.2)

## 2023-07-07 MED ORDER — WARFARIN - PHARMACIST DOSING INPATIENT: Freq: Every day | Status: DC

## 2023-07-07 MED ORDER — IPRATROPIUM-ALBUTEROL 20-100 MCG/ACT IN AERS: 1.0000 | INHALATION_SPRAY | Freq: Four times a day (QID) | RESPIRATORY_TRACT | Status: DC

## 2023-07-07 MED ORDER — PANTOPRAZOLE SODIUM 40 MG PO TBEC
40.0000 mg | DELAYED_RELEASE_TABLET | Freq: Every day | ORAL | Status: DC
  Administered 2023-07-08 – 2023-08-15 (×39): 40 mg via ORAL
  Filled 2023-07-07 (×39): qty 1

## 2023-07-07 MED ORDER — THIAMINE MONONITRATE 100 MG PO TABS
100.0000 mg | ORAL_TABLET | Freq: Every day | ORAL | Status: DC
  Administered 2023-07-08 – 2023-08-15 (×39): 100 mg via ORAL
  Filled 2023-07-07 (×39): qty 1

## 2023-07-07 MED ORDER — ZINC SULFATE 220 (50 ZN) MG PO CAPS
220.0000 mg | ORAL_CAPSULE | Freq: Every day | ORAL | Status: DC
  Administered 2023-07-08 – 2023-08-15 (×39): 220 mg via ORAL
  Filled 2023-07-07 (×39): qty 1

## 2023-07-07 MED ORDER — ROSUVASTATIN CALCIUM 20 MG PO TABS: 20.0000 mg | ORAL_TABLET | Freq: Every day | ORAL | Status: DC

## 2023-07-07 MED ORDER — CLONAZEPAM 0.25 MG PO TBDP
0.2500 mg | ORAL_TABLET | Freq: Two times a day (BID) | ORAL | Status: DC
  Administered 2023-07-07 – 2023-07-14 (×13): 0.25 mg via ORAL
  Filled 2023-07-07 (×13): qty 1

## 2023-07-07 MED ORDER — MINOCYCLINE HCL 100 MG PO CAPS: 200.0000 mg | ORAL_CAPSULE | Freq: Two times a day (BID) | ORAL | Status: DC

## 2023-07-07 MED ORDER — TAMSULOSIN HCL 0.4 MG PO CAPS
0.4000 mg | ORAL_CAPSULE | Freq: Every day | ORAL | Status: DC
  Administered 2023-07-08 – 2023-08-15 (×39): 0.4 mg via ORAL
  Filled 2023-07-07 (×39): qty 1

## 2023-07-07 MED ORDER — UMECLIDINIUM BROMIDE 62.5 MCG/ACT IN AEPB
1.0000 | INHALATION_SPRAY | Freq: Every day | RESPIRATORY_TRACT | Status: DC
  Administered 2023-07-08 – 2023-08-15 (×38): 1 via RESPIRATORY_TRACT
  Filled 2023-07-07 (×7): qty 7

## 2023-07-07 MED ORDER — SODIUM CHLORIDE 0.9 % IV SOLN
800.0000 mg | Freq: Every day | INTRAVENOUS | Status: AC
  Administered 2023-07-07 – 2023-08-14 (×39): 800 mg via INTRAVENOUS
  Filled 2023-07-07 (×40): qty 16

## 2023-07-07 MED ORDER — SERTRALINE HCL 50 MG PO TABS
50.0000 mg | ORAL_TABLET | Freq: Every day | ORAL | Status: DC
  Administered 2023-07-08 – 2023-08-15 (×39): 50 mg via ORAL
  Filled 2023-07-07: qty 1
  Filled 2023-07-07 (×2): qty 2
  Filled 2023-07-07: qty 1
  Filled 2023-07-07: qty 2
  Filled 2023-07-07: qty 1
  Filled 2023-07-07 (×2): qty 2
  Filled 2023-07-07 (×6): qty 1
  Filled 2023-07-07: qty 2
  Filled 2023-07-07 (×7): qty 1
  Filled 2023-07-07 (×2): qty 2
  Filled 2023-07-07: qty 1
  Filled 2023-07-07 (×3): qty 2
  Filled 2023-07-07: qty 1
  Filled 2023-07-07: qty 2
  Filled 2023-07-07: qty 1
  Filled 2023-07-07 (×2): qty 2
  Filled 2023-07-07 (×5): qty 1
  Filled 2023-07-07 (×2): qty 2
  Filled 2023-07-07: qty 1
  Filled 2023-07-07 (×4): qty 2
  Filled 2023-07-07 (×2): qty 1
  Filled 2023-07-07 (×2): qty 2
  Filled 2023-07-07: qty 1
  Filled 2023-07-07: qty 2
  Filled 2023-07-07: qty 1
  Filled 2023-07-07 (×2): qty 2
  Filled 2023-07-07: qty 1
  Filled 2023-07-07 (×4): qty 2
  Filled 2023-07-07 (×2): qty 1
  Filled 2023-07-07: qty 2
  Filled 2023-07-07 (×2): qty 1
  Filled 2023-07-07 (×3): qty 2
  Filled 2023-07-07 (×2): qty 1
  Filled 2023-07-07: qty 2
  Filled 2023-07-07 (×3): qty 1

## 2023-07-07 MED ORDER — ACETAMINOPHEN 325 MG PO TABS
650.0000 mg | ORAL_TABLET | ORAL | Status: DC | PRN
  Administered 2023-07-22 – 2023-08-15 (×19): 650 mg via ORAL
  Filled 2023-07-07 (×19): qty 2

## 2023-07-07 MED ORDER — SODIUM CHLORIDE 0.9 % IV SOLN
3.0000 g | Freq: Four times a day (QID) | INTRAVENOUS | Status: DC
  Administered 2023-07-07 – 2023-07-09 (×7): 3 g via INTRAVENOUS
  Filled 2023-07-07 (×7): qty 8

## 2023-07-07 MED ORDER — MIRTAZAPINE 15 MG PO TABS
15.0000 mg | ORAL_TABLET | Freq: Every day | ORAL | Status: DC
  Administered 2023-07-07 – 2023-08-14 (×38): 15 mg via ORAL
  Filled 2023-07-07 (×40): qty 1

## 2023-07-07 MED ORDER — WARFARIN SODIUM 3 MG PO TABS
6.0000 mg | ORAL_TABLET | Freq: Once | ORAL | Status: AC
  Administered 2023-07-07: 6 mg via ORAL
  Filled 2023-07-07: qty 2

## 2023-07-07 MED ORDER — HEPARIN (PORCINE) 25000 UT/250ML-% IV SOLN
500.0000 [IU]/h | INTRAVENOUS | Status: DC
  Administered 2023-07-07: 500 [IU]/h via INTRAVENOUS
  Filled 2023-07-07: qty 250

## 2023-07-07 MED ORDER — FLUTICASONE PROPIONATE 50 MCG/ACT NA SUSP
2.0000 | NASAL | Status: DC | PRN
  Administered 2023-07-28 – 2023-08-07 (×2): 2 via NASAL
  Filled 2023-07-07: qty 16

## 2023-07-07 MED ORDER — ENSURE ENLIVE PO LIQD
237.0000 mL | Freq: Three times a day (TID) | ORAL | Status: DC
  Administered 2023-07-08 (×3): 237 mL via ORAL

## 2023-07-07 MED ORDER — METOPROLOL SUCCINATE ER 25 MG PO TB24
25.0000 mg | ORAL_TABLET | Freq: Every day | ORAL | Status: DC
  Administered 2023-07-08 – 2023-08-15 (×39): 25 mg via ORAL
  Filled 2023-07-07 (×39): qty 1

## 2023-07-07 MED ORDER — ALLOPURINOL 100 MG PO TABS
100.0000 mg | ORAL_TABLET | Freq: Every day | ORAL | Status: DC
  Administered 2023-07-08 – 2023-08-15 (×39): 100 mg via ORAL
  Filled 2023-07-07 (×39): qty 1

## 2023-07-07 MED ORDER — METHIMAZOLE 5 MG PO TABS: 5.0000 mg | ORAL_TABLET | Freq: Every day | ORAL | Status: DC

## 2023-07-07 MED ORDER — FLUTICASONE FUROATE-VILANTEROL 100-25 MCG/ACT IN AEPB
1.0000 | INHALATION_SPRAY | Freq: Every day | RESPIRATORY_TRACT | Status: DC
  Administered 2023-07-08 – 2023-08-15 (×39): 1 via RESPIRATORY_TRACT
  Filled 2023-07-07 (×3): qty 28

## 2023-07-07 MED ORDER — VITAMIN B-1 100 MG PO TABS: 100.0000 mg | ORAL_TABLET | Freq: Every day | ORAL | Status: DC

## 2023-07-07 NOTE — Progress Notes (Addendum)
Pharmacy Antibiotic Note  Phill Mathwig. is a 62 y.o. male admitted on 07/07/2023 with  driveline infection . Pharmacy has been consulted for daptomycin and Unasyn dosing. ID consulted, history of growing acitenobacter baumannii and MRSA. 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   Plan: Daptomycin 800 mg every 24 hours Unasyn 3 gm every 6 hours F/u blood and driveline cx and continue to monitor renal function Monitor CK with AM labs on 7/30, then monitor weekly  Weight: 91.4 kg (201 lb 9.6 oz)  Temp (24hrs), Avg:98.3 F (36.8 C), Min:98.2 F (36.8 C), Max:98.3 F (36.8 C)  Recent Labs  Lab 07/07/23 1436  WBC 7.8  CREATININE 1.25*    Estimated Creatinine Clearance: 69.7 mL/min (A) (by C-G formula based on SCr of 1.25 mg/dL (H)).    Allergies  Allergen Reactions   Vancomycin Nausea And Vomiting and Other (See Comments)    Severe joint pain    Antimicrobials this admission: 7/29 Daptomycin >>  7/29 Unasyn >>   Microbiology results: 7/29 BCx: sent 7/29 Driveline Wound Cx: pending   Thank you for allowing pharmacy to be a part of this patient's care.  Enos Fling, PharmD PGY-1 Acute Care Pharmacy Resident 07/07/2023 5:54 PM

## 2023-07-07 NOTE — H&P (Addendum)
Advanced Heart Failure VAD History and Physical Note   PCP-Cardiologist: Nona Dell, MD   Reason for Admission: driveline infection and suspected driveline fracture  HPI:    Kevin Underwood. Is a 62 y.o. 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 type 2 diabetes mellitus, COPD, and hypertension. Now s/p Heartmate 3 LVAD.   Patient had echo in 10/21 with EF 20-25%.  He was admitted in 12/21 with cardiogenic shock.  Cath showed patent grafts and low cardiac output.  Suspect mixed ischemic/nonischemic cardiomyopathy, prior heavy ETOH though no ETOH or smoking since 12/20 CABG.  He required stabilization with Impella 5.5 and ultimately had placement of Heartmate 3 LVAD.  Paroxysmal atrial fibrillation noted post-op, but generally stable course and discharged home.     Patient was admitted in 10/22 with COPD exacerbation.  [name] was stopped with frequent PIs.  He was treated with steroids, nebs, antibiotics with improvement.    Ramp echo was done in 2/23.  At 5600 rpm the LV was small and the septum with some left shift, aortic valve opening 3/5 beats.  At 5500 rpm, the LV was larger and septum more midline, aortic valve still opening 3/5 beats.  The RV was moderately enlarged with normal systolic function. I left speed at 5500 rpm.    Patient had a prolonged admission in 6/23 and again in 7/23-8/23 with MRSA driveline infection.  He went to the OR multiple times for debridement and wound vac changes.  He completed vancomycin and was placed on doxycycline.    Levofloxacin added d/t increased drainage and redness after last f/u on 09/04/22. Wound culture grew acinetobacter.  Wound initially improved but cultures repeated d/t decreased healing over last few dressing changes. Levofloxacin recently stopped and back on doxy.   Grew acinetobacter and MRSA on culture. ID recommended switching to minocycline 200 BID based on culture results.   Patient was admitted with swollen and erythematous right wrist in 10/23.  There was concern for septic arthritis. CT showed no effusion that could be tapped.  Suspect this was likely a gout flare as it improved rapidly with colchicine.     Patient was admitted with recurrent MRSA driveline site infection in 2/24.  He went to the OR several times for debridement and wound vac management. Also with mild AECOPD. He was on daptomycin + minocycline, completed daptomycin at SNF.   Presented to AHF clinic today after control had been alarming "Driveline faults" for the last 2 months. Over the past two months he has had around 5 falls, one where he fell mid sternum onto a cinder block. He has been using meth on and off per wife report. Reports compliance with all medications. Has not been coming in for LVAD appointments. Wife has been changing his DL dressing daily. Today at f/u, wound with new green/yellow drainage. Concern for driveline fracture. Alarms have appeared to have resolved after changing modular cable and control.   Plan to admit to inpatient for further imaging and labs. Will consult Dr. Maren Beach and ID. Suspect he may need I&D and new antibiotics. Checking wound and blood cultures. See below for full plan.   LVAD INTERROGATION:  HeartMate II LVAD:  Unable to interrogate device as screen only says "driveline faults". [facility] unable to interrogate as well.   Home Medications Prior to Admission medications  Medication Sig Start Date End Date Taking? Authorizing Provider  acetaminophen (TYLENOL) 325 MG tablet Take 2 tablets (650 mg total) by mouth every 4 (four) hours as needed for headache or mild pain. 02/20/23   Alen Bleacher, NP  albuterol (PROVENTIL) (2.5 MG/3ML) 0.083% nebulizer solution INHALE 3 ML BY NEBULIZATION EVERY 6 HOURS AS NEEDED FOR WHEEZING OR SHORTNESS OF BREATH  09/10/22   Laurey Morale, MD  albuterol (VENTOLIN HFA) 108 (90 Base) MCG/ACT inhaler Inhale 2 puffs into the lungs every 6 (six) hours as needed for wheezing or shortness of breath.    [provider]  allopurinol (ZYLOPRIM) 100 MG tablet Take 1 tablet (100 mg total) by mouth daily. 12/04/21   Laurey Morale, MD  clonazePAM (KLONOPIN) 0.25 MG disintegrating tablet Take 1 tablet (0.25 mg total) by mouth 2 (two) times daily. Patient not taking: Reported on 04/21/2023 03/24/23   Alen Bleacher, NP  colchicine 0.6 MG tablet Take 1 tablet (0.6 mg total) by mouth daily as needed (gout pain). 04/14/23   Laurey Morale, MD  COMBIVENT RESPIMAT 20-100 MCG/ACT AERS respimat Inhale 1 puff into the lungs every 6 (six) hours. 09/10/22   [provider]  feeding supplement, GLUCERNA SHAKE, (GLUCERNA SHAKE) LIQD Take 237 mLs by mouth 3 (three) times daily with meals. 02/20/23   Alen Bleacher, NP  fluticasone [facility] ALLERGY RELIEF) 50 MCG/ACT nasal spray Place 2 sprays into both nostrils as needed for allergies or rhinitis.    [provider]  glipiZIDE (GLUCOTROL) 5 MG tablet Take 1 tablet (5 mg total) by mouth daily. 04/14/23 04/13/24  Laurey Morale, MD  loratadine (CLARITIN) 10 MG tablet Take 1 tablet (10 mg total) by mouth daily. 04/14/23   Laurey Morale, MD  metFORMIN (GLUCOPHAGE) 1000 MG tablet Take 1 tablet (1,000 mg total) by mouth 2 (two) times daily. 04/14/23   Laurey Morale, MD  methimazole (TAPAZOLE) 5 MG tablet TAKE 1 TABLET BY MOUTH EVERY DAY WITH BREAKFAST Strength: 5 mg 04/14/23   Laurey Morale, MD  metoprolol succinate (TOPROL XL) 25 MG 24 hr tablet Take 1 tablet (25 mg total) by mouth daily. 04/14/23   Laurey Morale, MD  minocycline (MINOCIN) 100 MG capsule Take 2 capsules (200 mg total) by mouth 2 (two) times daily. 04/14/23   Laurey Morale, MD  mirtazapine (REMERON) 15 MG tablet Take 1 tablet (15 mg total) by mouth at bedtime. 04/14/23   Laurey Morale, MD  Multiple  Vitamin (MULTIVITAMIN WITH MINERALS) TABS tablet Take 1 tablet by mouth daily. 02/20/23   Alen Bleacher, NP  Multiple Vitamin (MULTIVITAMIN) tablet Take 1 tablet by mouth daily.    [provider]  pantoprazole (PROTONIX) 40 MG tablet Take 1 tablet (40 mg total) by mouth daily. 04/14/23   Laurey Morale, MD  rosuvastatin (CRESTOR) 20 MG tablet Take 1 tablet (20 mg total) by mouth daily. 04/14/23   Laurey Morale, MD  sertraline (ZOLOFT) 50 MG tablet Take 1 tablet (50 mg total) by mouth daily. 04/14/23   Laurey Morale, MD  tamsulosin (FLOMAX) 0.4 MG CAPS capsule Take 1 capsule (0.4 mg total) by mouth daily. 04/14/23   Laurey Morale, MD  thiamine (VITAMIN B-1) 100 MG tablet Take 1 tablet (100 mg total) by mouth daily. 02/20/23   Alen Bleacher, NP  traZODone (DESYREL) 150 MG tablet Take 1 tablet (150 mg total) by mouth at bedtime. 03/11/22   Shirlee Latch,  Eliot Ford, MD  TRELEGY ELLIPTA 100-62.5-25 MCG/ACT AEPB Inhale 1 puff into the lungs daily. 04/14/23   Laurey Morale, MD  triamcinolone ointment (KENALOG) 0.5 % Apply 1 Application topically daily. Apply to affected area during wound care daily 03/03/23   Lovett Sox, MD  warfarin (COUMADIN) 4 MG tablet Take 1-1.5 tablets (4-6 mg total) by mouth See admin instructions. 4 mg every Tuesday and Thursday. 6 mg all other days 04/14/23   Laurey Morale, MD  zinc sulfate 220 (50 Zn) MG capsule Take 1 capsule (220 mg total) by mouth daily. 02/20/23   Alen Bleacher, NP    Past Medical History: Past Medical History:  Diagnosis Date   "    Arthritis    CAD (coronary artery disease)    a. s/p CABG in 11/2019 with LIMA-LAD, SVG-OM1, SVG-PDA and SVG-D1   CHF (congestive heart failure) (HCC)    a. EF < 20% by echo in 11/2019   Essential hypertension    PAF (paroxysmal atrial fibrillation) (HCC)    Type 2 diabetes mellitus (HCC)     Past Surgical History: Past Surgical History:  Procedure Laterality Date   APPLICATION OF WOUND VAC Left 06/14/2022    Procedure: APPLICATION OF WOUND VAC;  Surgeon: Lovett Sox, MD;  Location: [facility] OR;  Service: Thoracic;  Laterality: Left;   APPLICATION OF WOUND VAC N/A 06/20/2022   Procedure: VAD POWERCORD TUNNEL WOUND VAC CHANGE;  Surgeon: Lovett Sox, MD;  Location: [facility] OR;  Service: Thoracic;  Laterality: N/A;   APPLICATION OF WOUND VAC N/A 06/27/2022   Procedure: WOUND VAC CHANGE;  Surgeon: Lovett Sox, MD;  Location: [facility] OR;  Service: Thoracic;  Laterality: N/A;   APPLICATION OF WOUND VAC N/A 07/08/2022   Procedure: WOUND VAC CHANGE;  Surgeon: Lovett Sox, MD;  Location: [facility];  Service: Thoracic;  Laterality: N/A;   APPLICATION OF WOUND VAC N/A 01/28/2023   Procedure: APPLICATION OF WOUND VAC;  Surgeon: Lovett Sox, MD;  Location: [facility];  Service: Thoracic;  Laterality: N/A;   APPLICATION OF WOUND VAC N/A 02/05/2023   Procedure: WOUND VAC CHANGE;  Surgeon: Lovett Sox, MD;  [facility]: [facility];  Service: Thoracic;  Laterality: N/A;   APPLICATION OF WOUND VAC N/A 02/12/2023   Procedure: REMOVAL OF WOUND VAC DRESSING;  Surgeon: Lovett Sox, MD;  Location: [facility];  Service: Thoracic;  Laterality: N/A;   BACK SURGERY     CLIPPING OF ATRIAL APPENDAGE N/A 11/26/2019   Procedure: Clipping Of Atrial Appendage using AtriCure 40 Clip;  Surgeon: [name], MD;  Location: [facility];  Service: Open Heart Surgery;  Laterality: N/A;   CORONARY ARTERY BYPASS GRAFT N/A 11/26/2019   Procedure: CORONARY ARTERY BYPASS GRAFTING (CABG) using endoscopic greater saphenous vein harvest: svc to OM; svc to Diag; svc to PD; and LIMA to LAD.;  Surgeon: [name], MD;  Location: [facility];  Service: Open Heart Surgery;  Laterality: N/A;   FOOT SURGERY     HAND SURGERY     INSERTION OF IMPLANTABLE LEFT VENTRICULAR ASSIST DEVICE N/A 11/17/2020   Procedure: INSERTION OF IMPLANTABLE LEFT VENTRICULAR ASSIST DEVICE - HM3;  Surgeon: [name], MD;  Location: [facility];  Service: Open Heart Surgery;  Laterality: N/A;    INTRAOPERATIVE TRANSESOPHAGEAL ECHOCARDIOGRAM  12/04/2019   Procedure: Intraoperative Transesophageal Echocardiogram;  Surgeon: [name], MD;  Location: [facility] OR;  Service: Open Heart Surgery;;   MAZE N/A 11/26/2019   Procedure: MAZE using Bilateral Pulmonary  Vein isolation.;  Surgeon: [name], MD;  Location: [facility] OR;  Service: Open Heart Surgery;  Laterality: N/A;   MULTIPLE EXTRACTIONS WITH ALVEOLOPLASTY N/A 11/16/2020   Procedure: MULTIPLE EXTRACTION WITH ALVEOLOPLASTY;  Surgeon: Sharman Cheek, DMD;  Location: [facility];  Service: Dentistry;  Laterality: N/A;   PLACEMENT OF IMPELLA LEFT VENTRICULAR ASSIST DEVICE N/A 11/24/2019   Procedure: PLACEMENT OF IMPELLA 5.5 LEFT VENTRICULAR ASSIST DEVICE;  Surgeon: [name], MD;  Location: [facility];  Service: Open Heart Surgery;  Laterality: N/A;   PLACEMENT OF IMPELLA LEFT VENTRICULAR ASSIST DEVICE Left 11/10/2020   Procedure: PLACEMENT OF IMPELLA 5.5 LEFT VENTRICULAR ASSIST DEVICE VIA  LEFT AVILLARY ARTERY;  Surgeon: [name], MD;  Location: [facility];  Service: Open Heart Surgery;  Laterality: Left;   REMOVAL OF IMPELLA LEFT VENTRICULAR ASSIST DEVICE Right 12/04/2019   Procedure: REMOVAL OF IMPELLA LEFT VENTRICULAR ASSIST DEVICE, right axilla;  Surgeon: [name], MD;  Location: [facility] OR;  Service: Open Heart Surgery;  Laterality: Right;   RIGHT/LEFT HEART CATH AND CORONARY ANGIOGRAPHY N/A 11/24/2019   Procedure: RIGHT/LEFT HEART CATH AND CORONARY ANGIOGRAPHY;  Surgeon: Laurey Morale, MD;  Location: [facility] INVASIVE CV LAB;  Service: Cardiovascular;  Laterality: N/A;   RIGHT/LEFT HEART CATH AND CORONARY/GRAFT ANGIOGRAPHY N/A 11/09/2020   Procedure: RIGHT/LEFT HEART CATH AND CORONARY/GRAFT ANGIOGRAPHY;  Surgeon: [name], MD;  Location: [facility] INVASIVE CV LAB;  Service: Cardiovascular;  Laterality: N/A;   STERNAL WOUND DEBRIDEMENT N/A 05/16/2022   Procedure: VAD DRIVELINE DEBRIDEMENT;  Surgeon: Lovett Sox, MD;   Location: [facility];  Service: Thoracic;  Laterality: N/A;  VANC PULSE LAVAGE   STERNAL WOUND DEBRIDEMENT Left 06/14/2022   Procedure: DEBRIDEMENT OF VAD POWERCORD TUNNEL;  Surgeon: Lovett Sox, MD;  Location: [facility];  Service: Thoracic;  Laterality: Left;   STERNAL WOUND DEBRIDEMENT N/A 06/27/2022   Procedure: ABDOMINAL WOUND DEBRIDEMENT, APPLICATION OF MYRIAD;  Surgeon: Lovett Sox, MD;  [facility]: [facility];  Service: Thoracic;  Laterality: N/A;   STERNAL WOUND DEBRIDEMENT N/A 07/08/2022   Procedure: ABDOMINAL WOUND DEBRIDEMENT;  Surgeon: Lovett Sox, MD;  [facility]: [facility];  Service: Thoracic;  Laterality: N/A;   STERNAL WOUND DEBRIDEMENT N/A 01/28/2023   Procedure: ABDOMINAL DRIVELINE WOUND DEBRIDEMENT;  Surgeon: Lovett Sox, MD;  Location: [facility];  Service: Thoracic;  Laterality: N/A;   STERNAL WOUND DEBRIDEMENT N/A 02/05/2023   Procedure: ABDOMINAL DRIVELINE WOUND DEBRIDEMENT;  Surgeon: Lovett Sox, MD;  Location: [facility];  Service: Thoracic;  Laterality: N/A;   STERNAL WOUND DEBRIDEMENT N/A 02/12/2023   Procedure: ABDOMINAL DRIVELINE WOUND DEBRIDEMENT AND APPLICATION OF MATRIX WOUND DRESSING;  Surgeon: Lovett Sox, MD;  Location: [facility];  Service: Thoracic;  Laterality: N/A;   TEE WITHOUT CARDIOVERSION N/A 11/24/2019   Procedure: TRANSESOPHAGEAL ECHOCARDIOGRAM (TEE);  Surgeon: [name], MD;  Location[facility] OR;  Service: Open Heart Surgery;  Laterality: N/A;   TEE WITHOUT CARDIOVERSION N/A 11/26/2019   Procedure: TRANSESOPHAGEAL ECHOCARDIOGRAM (TEE);  Surgeon: [name], MD;  Location: [facility] OR;  Service: Open Heart Surgery;  Laterality: N/A;   TEE WITHOUT CARDIOVERSION N/A 11/10/2020   Procedure: TRANSESOPHAGEAL ECHOCARDIOGRAM (TEE);  Surgeon: [name], MD;  Location: [facility] OR;  Service: Open Heart Surgery;  Laterality: N/A;   TEE WITHOUT CARDIOVERSION N/A 11/17/2020   Procedure: TRANSESOPHAGEAL ECHOCARDIOGRAM (TEE);  Surgeon: [name], MD;  Location: [facility] OR;   Service: Open Heart Surgery;  Laterality: N/A;   WOUND EXPLORATION N/A 05/31/2022   Procedure: WOUND  EXPLORATION, Debridement and Irrigation of abdominal wound;  Surgeon: Lovett Sox, MD;  Location: [facility] OR;  Service: Thoracic;  Laterality: N/A;    Family History: Family History  Problem Relation Age of Onset   Heart disease Sister        Tumor?   Arthritis Other    Lung disease Other    Asthma Other    Diabetes Other    Thyroid disease Neg Hx     Social History: Social History   Socioeconomic History   Marital status: Married    Spouse name: Not on file   Number of children: Not on file   Years of education: 12   Highest education level: Not on file  Occupational History   Not on file  Tobacco Use   Smoking status: Former    Current packs/day: 0.00    Average packs/day: 1 pack/day for 30.0 years (30.0 ttl pk-yrs)    Types: Cigarettes    Start date: 12/19/1989    Quit date: 12/20/2019    Years since quitting: 3.5   Smokeless tobacco: Never  Vaping Use   Vaping status: Never Used  Substance and Sexual Activity   Alcohol use: Not Currently    Comment: Prior history of excessive intake   Drug use: Yes    Types: Methamphetamines   Sexual activity: Not on file  Other Topics Concern   Not on file  Social History Narrative   Patient was born and raised in [place]. He denies any issues with walking or talking as a child. He completed the 9th grade. He is currently financially supported through disability. He is married and he has four children he is estranged from. He is currently living with his wife.    Social Determinants of Health   Financial Resource Strain: Medium Risk (05/17/2022)   Overall Financial Resource Strain (CARDIA)    Difficulty of Paying Living Expenses: Somewhat hard  Food Insecurity: No Food Insecurity (02/05/2023)   Hunger Vital Sign    Worried About Running Out of Food in the Last Year: Never true    Ran Out of Food in the Last Year: Never  true  Transportation Needs: Unmet Transportation Needs (02/05/2023)   PRAPARE - Administrator, Civil Service (Medical): Yes    Lack of Transportation (Non-Medical): No  Physical Activity: Inactive (11/10/2020)   Exercise Vital Sign    Days of Exercise per Week: 0 days    Minutes of Exercise per Session: 0 min  Stress: Not on file  Social Connections: Not on file    Allergies:  Allergies  Allergen Reactions   Vancomycin Nausea And Vomiting and Other (See Comments)    Severe joint pain    Objective:    Vital Signs:   Automatic BP: 112/79 (98) HR: 80 SPO2: 98%  Mean arterial Pressure 90s  Physical Exam  General:  Well appearing. No resp difficulty HEENT: Normal Neck: supple. JVP flat. Carotids 2+ bilat; no bruits. No lymphadenopathy or thyromegaly appreciated. Cor: Mechanical heart sounds with LVAD hum present. Lungs: Clear Abdomen: soft, nontender, nondistended. No hepatosplenomegaly. No bruits or masses. Good bowel sounds. Large hernia Driveline: C/D/I; DL secured Extremities: no cyanosis, clubbing, rash, edema Neuro: alert & orientedx3, cranial nerves grossly intact. moves all 4 extremities w/o difficulty. Affect pleasant   Telemetry   Not on tele yet  EKG   ST with PACs (reviewed from 2/24)  Labs    Basic Metabolic Panel: No results for input(s): "NA", "K", "CL", "  CO2", "GLUCOSE", "BUN", "CREATININE", "CALCIUM", "MG", "PHOS" in the last 168 hours.  Liver Function Tests: No results for input(s): "AST", "ALT", "ALKPHOS", "BILITOT", "PROT", "ALBUMIN" in the last 168 hours. No results for input(s): "LIPASE", "AMYLASE" in the last 168 hours. No results for input(s): "AMMONIA" in the last 168 hours.  CBC: No results for input(s): "WBC", "NEUTROABS", "HGB", "HCT", "MCV", "PLT" in the last 168 hours.  Cardiac Enzymes: No results for input(s): "CKTOTAL", "CKMB", "CKMBINDEX", "TROPONINI" in the last 168 hours.  BNP: BNP (last 3 results) No results  for input(s): "BNP" in the last 8760 hours.  ProBNP (last 3 results) No results for input(s): "PROBNP" in the last 8760 hours.   CBG: No results for input(s): "GLUCAP" in the last 168 hours.  Coagulation Studies: No results for input(s): "LABPROT", "INR" in the last 72 hours.  Other results  Imaging    DG Abd 1 View  Result Date: 07/07/2023 CLINICAL DATA:  Checking left ventricular assist device wires. EXAM: ABDOMEN - 1 VIEW COMPARISON:  KUB 02/04/2023. FINDINGS: The left ventricular assist device is partially imaged. The associated lead is intact, with no fracture or kink. There is nonobstructive bowel gas pattern. Postsurgical changes are noted in the lower lumbar spine. There is no acute osseous abnormality. IMPRESSION: Intact LVAD wires. Electronically Signed   By: Lesia Hausen M.D.   On: 07/07/2023 14:46    Patient Profile:  Kevin Underwood. Is a 62 y.o. 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 type 2 diabetes mellitus, COPD, and hypertension. Now s/p Heartmate 3 LVAD.   Presented to AHF clinic today after LVAD alarming driveline fault. Has had multiple falls. Suspected DL fracture and DL infection. AHF team to admit.  Assessment/Plan:   1.  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  Cultures have grown acinetobacter and MRSA. He was readmitted with driveline infection in 2/24, completed course of daptomycin for MRSA. DL site with new green/yellow drainage.  - Continue long-term on minocycline.    - consult ID - Wound cultures and blood cultures - Will discuss with Dr. Maren Beach, may need I&D 2. Suspected DL fracture with multiple recent falls - Has had multiple falls during the last 2 months, one direct trauma to mid  sternum (fell on cinder block) - CT chest/abd/pelvis - CXR done, read pending - plan to send imaging to [facility] - LVAD coordinators to change modular cable and controller - Not candidate for VAD exchange with recurrent drug abuse 3.  Chronic systolic CHF: Echo 10/21 with EF 20-25%, mildly decreased RV function. LHC/RHC in 12/21 with patent grafts, low output. Suspect mixed ischemic/nonischemic cardiomyopathy (prior heavy ETOH and drugs as well as CAD). Admitted with cardiogenic shock in 12/21, had placement of Impella 5.5 initially, now s/p Heartmate 3 LVAD on 11/17/20.  Ramp echo 2/23 with speed decreased to 5500 rpm. Speed now back to 5600 rpm.   - He is not volume overloaded on exam.      - Plan for ramp echo tomorrow - Continue current Toprol XL 25 mg daily.  - He does not need a diuretic.   - Continue warfarin, INR goal 2-2.5. May need heparin gtt.  4. CKD stage 3: Prior CTA abdomen/pelvis with high-grade stenosis of proximal right renal artery.  -  Would consider eventual reassessment of renal artery stenosis and possible treatment  - follow labs 5.  CAD: S/p CABG 12/20.  LHC pre-VAD with patent grafts, no target for intervention. - Continue Crestor 20 mg daily.  6.  Atrial fibrillation: Paroxysmal.  S/p Maze and LA appendage clip with CABG in 12/20. Maintaining SR.  - Continue Toprol XL 25 mg daily.  - Off amiodarone with hyperthyroidism. 7. 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  - check urine drug screen 9. 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  Followed by endocrinology. He is now off amiodarone.  - Continue methimazole  - Check TSH, free T3, free T4.  10. Anemia (chronic blood loss): labs pending 11. COPD: Prior smoker.  - Continue Trelegy.  12. PVCs/NSVT: Now off amiodarone.  13. Depression: Has turned to substance abuse to cope.   - Has been referred to OP psych in [place]  - Consult TOC  while IP to assist with substance abuse  14.  Gout: Continue allopurinol, he has prn colchicine.   I reviewed the LVAD parameters from today, and compared the results to the patient's prior recorded data.  No programming changes were made.  The LVAD is functioning within specified parameters.  The patient performs LVAD self-test daily.  LVAD interrogation was negative for any significant power changes, alarms or PI events/speed drops.  LVAD equipment check completed and is in good working order.  Back-up equipment present.   LVAD education done on emergency procedures and precautions and reviewed exit site care.  Length of Stay: 0  Alen Bleacher, NP 07/07/2023, 2:59 PM  VAD Team Pager [phone] (7am - 7am) +++VAD ISSUES ONLY+++   Advanced Heart Failure Team Pager [phone] (M-F; 7a - 5p)  Please contact [facility] for night-coverage after hours (5p -7a ) and weekends on [email or web address] for all non- LVAD Issues   Patient seen with NP, agree with the above note.   He is worked into clinic today with "driveline communication fault" error message on LVAD.  No pump stoppages.  LVAD parameters are stable.  He reports a fall 2 wks ago, was outside in heat and thinks he got orthostatic, did not pass out.   Patient has not been compliant with LVAD followup or INR checks.  He has been using methamphetamine, says last use was "1 month ago."    His driveline has had significant greenish drainage, he denies fever/chills.   General: Well appearing this am. NAD.  HEENT: Normal. Neck: Supple, JVP 7-8 cm. Carotids OK.  Cardiac:  Mechanical heart sounds with LVAD hum present.  Lungs:  Mildly distant BS.  Abdomen:  NT, ND, no HSM. Chronic abdominal wall hernia.  +BS  LVAD exit site: Greenish drainage from driveline site.  Extremities:  Warm and dry. No cyanosis, clubbing, rash, or edema.  Neuro:  Alert & oriented x 3. Cranial nerves grossly intact. Moves all 4 extremities w/o difficulty. Affect pleasant     Suspect trauma to LVAD cable, LVAD coordinator replaced modular cable and controller with resolution of driveline communication fault signal.   He has history of driveline infection and has been on chronic minocycline (MRSA and acinetobacter in the past).  Would start empiric daptomycin/Unasyn per ID and send wound and blood cultures.  Will get CT abdomen to assess for abscess and will ask Dr. Donata Clay to assess the site.   He had been on methimazole for hyperthyroidism related to  amiodarone but has not been taking.  TSH was high today and free T4 normal, would NOT restart methimazole.    Says last use of methamphetamine was 1 month ago.  Would send UDS.   Check INR, start heparin gtt if subtherapeutic.  Not sure how compliant he has been with warfarin.   Marca Ancona 07/07/2023 6:04 PM

## 2023-07-07 NOTE — Progress Notes (Addendum)
ANTICOAGULATION CONSULT NOTE - Initial Consult  Pharmacy Consult for warfarin Indication:  LVAD  Allergies  Allergen Reactions   Vancomycin Nausea And Vomiting and Other (See Comments)    Severe joint pain    Patient Measurements: Weight: 91.4 kg (201 lb 9.6 oz) Heparin Dosing Weight: 91.4 kg   Vital Signs: Temp: 98.3 F (36.8 C) (07/29 1713) Temp Source: Oral (07/29 1713) BP: 141/97 (07/29 1713) Pulse Rate: 80 (07/29 1434)  Labs: Recent Labs    07/07/23 1436  HGB 11.8*  HCT 35.6*  PLT 199  LABPROT 13.6  INR 1.0  CREATININE 1.25*    Estimated Creatinine Clearance: 69.7 mL/min (A) (by C-G formula based on SCr of 1.25 mg/dL (H)).   Medical History: Past Medical History:  Diagnosis Date   "    Arthritis    CAD (coronary artery disease)    a. s/p CABG in 11/2019 with LIMA-LAD, SVG-OM1, SVG-PDA and SVG-D1   CHF (congestive heart failure) (HCC)    a. EF < 20% by echo in 11/2019   Essential hypertension    PAF (paroxysmal atrial fibrillation) (HCC)    Type 2 diabetes mellitus (HCC)     Medications:  Scheduled:   allopurinol  100 mg Oral Daily   clonazepam  0.25 mg Oral BID   [START ON 07/08/2023] feeding supplement  237 mL Oral TID WC   fluticasone furoate-vilanterol  1 puff Inhalation Daily   And   umeclidinium bromide  1 puff Inhalation Daily   [START ON 07/08/2023] Ipratropium-Albuterol  1 puff Inhalation Q6H   metoprolol succinate  25 mg Oral Daily   mirtazapine  15 mg Oral QHS   [START ON 07/08/2023] pantoprazole  40 mg Oral Daily   [START ON 07/08/2023] rosuvastatin  20 mg Oral Daily   [START ON 07/08/2023] sertraline  50 mg Oral Daily   [START ON 07/08/2023] tamsulosin  0.4 mg Oral Daily   [START ON 07/08/2023] thiamine  100 mg Oral Daily   [START ON 07/08/2023] zinc sulfate  220 mg Oral Daily    Assessment: 62 yom who presented to clinic with concern for driveline fracture and infection. Last [facility] appointment was on 04/21/2023 - INR was subtherapeutic at  1.4 and regimen at that time was 6 mg daily except 4 mg Tues/Thurs (LD 7/25).  Question compliance - INR is 1. Hgb 11.8, plt 199. LDH 126. Given low INR, will start fixed rate heparin until INR is therapeutic. No s/sx of bleeding - CT image neg for bleed.  Goal of Therapy:  Heparin level <0.3 units/ml INR 2-2.5 Monitor platelets by anticoagulation protocol: Yes   Plan:  Will order warfarin 6 mg tonight  Order heparin infusion at 500 units/hr >> will check level in 8 hrs to make sure not elevated (no titrations unless discussed with MD) Monitor daily HL, INR, CBC, LDH, and for s/sx of bleeding   Thank you for allowing pharmacy to participate in this patient's care,  Sherron Monday, PharmD, BCCCP Clinical Pharmacist  Phone: [phone] 07/07/2023 6:50 PM  Please check AMION for all [facility] Pharmacy phone numbers After 10:00 PM, call Main Pharmacy [phone]

## 2023-07-07 NOTE — Progress Notes (Addendum)
ID brief note   62 Y O male admitted from VAD clinic with new green/yellow drainage from VAD wound. Plan to admit, work up with further imaging and CTVS consult noted. Reported patient has wound and blood cultures taken. OK to start Daptomycin and Unasyn given prior h/o MRSA and acitenobacter baumannii. Stop minocycline.  Communicated to HF team and Pharmacy.     Formal consult tomorrow  Odette Fraction, MD Infectious Disease Physician [facility] [address] [place], [place] [address] Phone: [phone]  Fax: [phone]

## 2023-07-07 NOTE — Progress Notes (Addendum)
Patient presents for driveline communication fault with wife [name]. Pt has no showed and cancelled all appointments in the past 2 months.   Received call from the pts wife today stating that he has had "alarms every couple hrs from his VAD for a couple months." Pts wife says that it is currently a driveline fault. Pt was instructed that this issues cannot be dealt with over the phone and that the pt must come to clinic.  On arrival to clinic pt had driveline communication fault. Pt also had green/blue drainage visible through his driveline bandage .  Pt states that he has experienced dizziness associated with a fall multiple times over the past couple months but no signs of bleeding. Complains of overall fatigue over the last week.   Pt tells me that the last time he used meth was over a month ago.   Pt and wife report compliance with Minocycline. Denies missing any doses. [facility] reports she is changing dressing once daily.   TSH added on to labs today.   Will schedule ramp echo in house for tomorrow  HeartMate 3 controller exchange performed at the bedside. We discussed that they should never attempt to do this at home without notifying VAD Pager first to triage need and provide support for the patient's safety as they may incur difficulties with the procedure and impair ability to restart pump. I performed elective controller exchange. Patient tolerated controller exchange well and was asymptomatic. Pump restarted as expected with stable VAD parameters on correct prescribed speed of 5600  rpms.     Vital Signs:  Doppler Pressure: 94 Automatic BP: 112/79 (98) HR: 80 SPO2: 98%    Weight: 199.4 lb w/o ept Last weight: 209.4 lb w/ ept  VAD Indication: Destination Therapy. HM III implanted 11/17/20 by Dr Vickey Sages   VAD interrogation: Speed: 5600 Flow: 4.1 Power: 4.2 w    PI: 4.5 Hct: 30  Alarms: none  Events: 100+  Fixed speed: 5500 Low speed limit: 5200  Primary controller:  replace back up battery in 32 months Back up controller: replace back up battery in 17 months   I reviewed the LVAD parameters from today and compared the results to the patient's prior recorded data. LVAD interrogation was NEGATIVE for significant power changes, NEGATIVE for clinical alarms and STABLE for PI events/speed drops. No programming changes were made and pump is functioning within specified parameters. Pt is performing daily controller and system monitor self tests along with completing weekly and monthly maintenance for LVAD equipment.   LVAD equipment check completed and is in good working order. Back-up equipment not present.    Annual Equipment Maintenance on UBC/PM was performed today on 01/15/2023.  Exit Site Care: Existing VAD dressing removed and site care performed using sterile techinque. VASHE solution to wound bed.  Vashe soaked 2x2 gauze placed around driveline and covered with dry gauze. wound tunnels at least 7 cm. Entire dressing covered with large tegaderm. Large amount of blue/green drainage noted on previous dressing. No redness, foul odor, or tenderness noted. Anchor replaced. Daily sterile wet to dry dressing changes per [facility]. Provided with 56 daily kits, 50 anchors  and 2 boxes of large tegaderms for home use.          BP & Labs:  Doppler 94- Doppler reflecting MAP   Hgb 11.8 - No S/S of bleeding. Specifically denies melena/BRBPR or nosebleeds.    LDH 126 -  with established baseline of 190 - 360. Denies tea-colored urine. No  power elevations noted on interrogation.    Plan: Admit to 2c  Carlton Adam RN,BSN VAD Coordinator  Office: [phone]  24/7 Pager: [phone]   Please see admission note from today.   Marca Ancona 07/07/2023 6:09 PM

## 2023-07-08 DIAGNOSIS — T827XXA Infection and inflammatory reaction due to other cardiac and vascular devices, implants and grafts, initial encounter: Secondary | ICD-10-CM | POA: Diagnosis not present

## 2023-07-08 LAB — TYPE AND SCREEN
ABO/RH(D): B POS
Antibody Screen: NEGATIVE

## 2023-07-08 MED ORDER — WARFARIN SODIUM 3 MG PO TABS
6.0000 mg | ORAL_TABLET | Freq: Once | ORAL | Status: AC
  Administered 2023-07-08: 6 mg via ORAL
  Filled 2023-07-08: qty 2

## 2023-07-08 MED ORDER — CEFAZOLIN SODIUM-DEXTROSE 2-4 GM/100ML-% IV SOLN
2.0000 g | INTRAVENOUS | Status: AC
  Administered 2023-07-09 (×2): 2 g via INTRAVENOUS

## 2023-07-08 NOTE — Progress Notes (Signed)
ANTICOAGULATION CONSULT NOTE -   Pharmacy Consult for warfarin + fixed-dose heparin Indication:  LVAD  Allergies  Allergen Reactions   Vancomycin Nausea And Vomiting and Other (See Comments)    Severe joint pain    Patient Measurements: Weight: 87.7 kg (193 lb 5.5 oz) Heparin Dosing Weight: 91.4 kg   Vital Signs: Temp: 97.9 F (36.6 C) (07/30 1100) Temp Source: Oral (07/30 1100) BP: 106/96 (07/30 1100) Pulse Rate: 62 (07/30 1100)  Labs: Recent Labs    07/07/23 1436 07/08/23 0753  HGB 11.8* 11.9*  HCT 35.6* 36.4*  PLT 199 189  LABPROT 13.6 13.8  INR 1.0 1.0  HEPARINUNFRC  --  <0.10*  CREATININE 1.25* 0.98  CKTOTAL  --  39*    Estimated Creatinine Clearance: 87.2 mL/min (by C-G formula based on SCr of 0.98 mg/dL).   Medical History: Past Medical History:  Diagnosis Date   "    Arthritis    CAD (coronary artery disease)    a. s/p CABG in 11/2019 with LIMA-LAD, SVG-OM1, SVG-PDA and SVG-D1   CHF (congestive heart failure) (HCC)    a. EF < 20% by echo in 11/2019   Essential hypertension    PAF (paroxysmal atrial fibrillation) (HCC)    Type 2 diabetes mellitus (HCC)     Medications:  Scheduled:   allopurinol  100 mg Oral Daily   clonazepam  0.25 mg Oral BID   feeding supplement  237 mL Oral TID WC   fluticasone furoate-vilanterol  1 puff Inhalation Daily   And   umeclidinium bromide  1 puff Inhalation Daily   metoprolol succinate  25 mg Oral Daily   mirtazapine  15 mg Oral QHS   pantoprazole  40 mg Oral Daily   sertraline  50 mg Oral Daily   tamsulosin  0.4 mg Oral Daily   thiamine  100 mg Oral Daily   Warfarin - Pharmacist Dosing Inpatient   Does not apply q1600   zinc sulfate  220 mg Oral Daily    Assessment: 62 yom who presented to clinic with concern for driveline fracture and infection. Last [facility] appointment was on 04/21/2023 - INR was subtherapeutic at 1.4 and regimen at that time was 6 mg daily except 4 mg Tues/Thurs (LD 7/25).  Given low INR on  admit, started fixed rate heparin until INR is therapeutic. No s/sx of bleeding - CT image neg for bleed.  CBC stable, INR today remains 1.  LDH stable 121.  Goal of Therapy:  Heparin level <0.3 units/ml INR 2-2.5 Monitor platelets by anticoagulation protocol: Yes   Plan:  Will order warfarin 6 mg again tonight  Continue heparin infusion at 500 units/hr >> (no titrations unless discussed with MD) Monitor daily heparin level, INR, CBC, LDH, and for s/sx of bleeding   Thank you for allowing pharmacy to participate in this patient's care,  Jenetta Downer, [facility] Clinical Pharmacist  07/08/2023 12:51 PM   [facility] pharmacy phone numbers are listed on [email or web address]

## 2023-07-08 NOTE — Progress Notes (Signed)
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 Started on IV antibiotics.   Patient presented to VAD clinic for driveline communication fault- stating that he has had "alarms every couple hrs from his VAD for a couple months." Pt has no showed and cancelled all appointments in the past 2 months. Pt also had green/blue drainage visible through his driveline bandage. (Dressing change and wound cx obtained). Pt states that he has experienced dizziness associated with a fall multiple times over the past couple months but no signs of bleeding. Controller and modular cable exchanged. No further communication fault alarms noted.   CT Chest/Abd/Pelvis 7/29 1. Similar appearance of mild circumferential thickening of the fat plane surrounding the drive line in the upper abdomen extending from the pump to the skin which may be chronic or represent an infectious process. No drainable fluid collection.  Plan for drive line debridement in OR on 07/09/23 with Dr Donata Clay.   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 Currently on IV Unasyn 3 g Q 6 hrs and IV Daptomycin 800 mg daily per ID team.   Pt sitting up on side of the bed on my arrival. Denies complaints. Discussed plan for OR tomorrow for debridement. He verbalized understanding.   Vital signs: Temp: 97.9 HR: 62 Doppler Pressure: 100 Automatic BP: 106/96 (101) O2 Sat: 97% on RA Wt: 193.3 lbs  LVAD interrogation reveals:  Speed: 5600 Flow: 3.9 Power: 4.3 w PI: 5.5 Alarms: none Events: 100+ Hct: 30  Fixed speed:  5600 Low speed limit:  5300  Drive Line: Existing VAD dressing removed and site care performed using sterile techinque. VASHE solution to wound bed. Skin surrounding wound cleansed with Chlora prep applicators x 2, and allowed to dry. CHG rinsed with saline wipe. VASHE moistened 4 x 4 placed directly at exit  site. Covered with several dry gauze. Wound tunnels at least 7 cm. Large amount of thick, foul smelling, serosanguinous drainage noted on previous dressing. No tenderness noted. Hypergranulation tissue noted directly at exit site. Skin near exit site red and raw from drainage sitting on skin. Anchor intact.  Daily sterile wet to dry dressing changes per bedside RN or VAD coordinator. Next dressing change in tomorrow 07/09/23 in OR by Dr Donata Clay.      Labs:  LDH trend: 121  INR trend: 1.0  Anticoagulation Plan: -INR Goal: 2.0 - 2.5 -ASA Dose: none  ICD: N/A  Infection:  07/07/23>> blood cultures>> no growth < 24 hrs; final pending 07/07/23>> wound cx>> few pseudomonas aeruginosa; final pending  Drips:  Heparin 500 units/hr  Plan/Recommendations:  Page VAD coordinator with equipment issues or driveline problems Daily drive line wet to dry dressing changes with VASHE solution.   Alyce Pagan RN VAD Coordinator  Office: [phone]  24/7 Pager: [phone]

## 2023-07-08 NOTE — Progress Notes (Signed)
Procedure(s) (LRB): DRIVELINE WOUND DEBRIDEMENT (N/A) APPLICATION OF WOUND VAC (N/A) Subjective: Patient examined, images of CT chest abdomen personally reviewed and counseled with patient.  62 year old male with a history of HeartMate 3 implantation for ischemic cardiomyopathy presented to the VAD clinic with VAD alarms due to equipment issues as well as significant drainage from the VAD tunnel wound.  Patient is afebrile with normal white count.  Blood cultures were drawn and are negative.  Patient has previous history of MRSA and Acinetobacter growing from the wound-tissue culture on admission shows few WBCs, no organisms.  The CT images show indurated tissue in the proximal tunnel but no fluid collection or abscess around the pump housing.  This appears to be cellulitis of the upper abdominal wall and would benefit from IV antibiotics [have been started], wound irrigation and careful debridement of indurated tissue. He will be scheduled for surgical debridement and wound irrigation in the OR tomorrow.  Tell them the wound will be treated with Vashe wet-to-dry dressing changes daily.  Patient is now on IV heparin due to an INR 1.1. Heparin will be stopped on-call to the OR tomorrow. Objective: Vital signs in last 24 hours: Temp:  [97.7 F (36.5 C)-98.8 F (37.1 C)] 97.9 F (36.6 C) (07/30 1100) Pulse Rate:  [62-100] 62 (07/30 1100) Cardiac Rhythm: Ventricular tachycardia (07/30 1003) Resp:  [14-20] 14 (07/30 1100) BP: (94-144)/(0-102) 106/96 (07/30 1100) SpO2:  [97 %-98 %] 97 % (07/30 0743) Weight:  [87.7 kg-91.4 kg] 87.7 kg (07/30 0626)  Hemodynamic parameters for last 24 hours:    Intake/Output from previous day: 07/29 0701 - 07/30 0700 In: 540.3 [P.O.:240; I.V.:34.3; IV Piggyback:266] Out: 1300 [Urine:1300] Intake/Output this shift: Total I/O In: 9.6 [I.V.:9.6] Out: -        Exam    General- alert and comfortable    Neck- no JVD, no cervical adenopathy palpable, no  carotid bruit   Lungs- clear without rales, wheezes   Cor- regular rate and rhythm, normal VAD hum   Abdomen- soft, non-tender, dressing over VAD tunnel intact, large herniation of abdominal wall due to debridement of chest wall muscle   Extremities - warm, non-tender, minimal edema   Neuro- oriented, appropriate, no focal weakness   Lab Results: Recent Labs    07/07/23 1436 07/08/23 0753  WBC 7.8 7.5  HGB 11.8* 11.9*  HCT 35.6* 36.4*  PLT 199 189   BMET:  Recent Labs    07/07/23 1436 07/08/23 0753  NA 136 138  K 3.7 4.2  CL 103 101  CO2 23 24  GLUCOSE 200* 207*  BUN 24* 19  CREATININE 1.25* 0.98  CALCIUM 8.6* 9.0    PT/INR:  Recent Labs    07/08/23 0753  LABPROT 13.8  INR 1.0   ABG    Component Value Date/Time   PHART 7.508 (H) 11/20/2020 0438   HCO3 24.8 09/09/2021 0313   TCO2 29 11/20/2020 0438   ACIDBASEDEF 6.0 (H) 11/24/2019 1718   O2SAT 63.4 09/09/2021 0313   CBG (last 3)  No results for input(s): "GLUCAP" in the last 72 hours.  Assessment/Plan: S/P Procedure(s) (LRB): DRIVELINE WOUND DEBRIDEMENT (N/A) APPLICATION OF WOUND VAC (N/A) OR surgical debridement of VAD tunnel tomorrow Stop IV heparin on-call to the OR Procedure benefits and risks have been discussed with the patient.  LOS: 1 day    Lovett Sox 07/08/2023

## 2023-07-08 NOTE — Consult Note (Signed)
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  He also has "driveline faults" error on lvad interrogation  Heart failure/cts teams to debride this admission and examine driveline  Unclear if purulence is an ongoing thing with him he has no other sx at the driveline and ct remains the same; there is no sign of sepsis otherwise.   Will await the culture and see if any resistant organism have developed 7/29 admission bcx ngtd  Plan: Continue dapto and amp-sulbactam Await operative cx  Further plan pending operative finding/cx      ------------------------------------------------ Principal Problem:   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 (LVAD) (HCC)    HPI: Kevin Underwood. is a 62 y.o. 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  He also has "driveline faults" error on lvad interrogation  He has been taking minocycline outpatient. Chart hx of noncompliance in past. Failed to f/u id clinic  for chronic/recurrent infection of drive line  He changes the dressing daily and reports increased purulence but no redness, pain, fever, chill Chf team also noted "drive line fault" error on lvad interrogation  No sign of decompensated heart failure, sepsis Afebrile, no leukocytosis  Ct scan showed stable finding around lvad line  Plan for debridement tomorrow 7/31  Family History  Problem Relation Age of Onset   Heart disease Sister        Tumor?   Arthritis Other    Lung disease Other    Asthma Other    Diabetes Other    Thyroid disease Neg Hx     Social History   Tobacco Use   Smoking status: Former    Current packs/day: 0.00    Average packs/day: 1 pack/day for 30.0 years (30.0 ttl pk-yrs)    Types: Cigarettes    Start date: 12/19/1989    Quit date: 12/20/2019    Years since quitting: 3.5   Smokeless tobacco: Never  Vaping Use   Vaping status: Never Used  Substance Use Topics   Alcohol use: Not Currently    Comment: Prior history of excessive intake   Drug use: Yes    Types: Methamphetamines    Allergies  Allergen Reactions   Vancomycin Nausea And Vomiting and Other (See Comments)    Severe joint pain    Review of Systems: ROS All Other ROS was negative, except mentioned above   Past Medical History:  Diagnosis Date   "    Arthritis    CAD (coronary artery disease)  a. s/p CABG in 11/2019 with LIMA-LAD, SVG-OM1, SVG-PDA and SVG-D1   CHF (congestive heart failure) (HCC)    a. EF < 20% by echo in 11/2019   Essential hypertension    PAF (paroxysmal atrial fibrillation) (HCC)    Type 2 diabetes mellitus (HCC)        Scheduled Meds:  allopurinol  100 mg Oral Daily   clonazepam  0.25 mg Oral BID   feeding supplement  237 mL Oral TID WC   fluticasone furoate-vilanterol  1 puff Inhalation Daily   And   umeclidinium bromide  1 puff Inhalation Daily   metoprolol succinate  25 mg Oral Daily   mirtazapine  15 mg Oral QHS   pantoprazole  40 mg  Oral Daily   sertraline  50 mg Oral Daily   tamsulosin  0.4 mg Oral Daily   thiamine  100 mg Oral Daily   Warfarin - Pharmacist Dosing Inpatient   Does not apply q1600   zinc sulfate  220 mg Oral Daily   Continuous Infusions:  ampicillin-sulbactam (UNASYN) IV 3 g (07/08/23 0604)    ceFAZolin (ANCEF) IV     DAPTOmycin (CUBICIN) 800 mg in sodium chloride 0.9 % IVPB 800 mg (07/07/23 2106)   heparin 500 Units/hr (07/08/23 0800)   PRN Meds:.acetaminophen, fluticasone   OBJECTIVE: Blood pressure (!) 106/96, pulse 62, temperature 97.9 F (36.6 C), temperature source Oral, resp. rate 14, weight 87.7 kg, SpO2 97%.  Physical Exam  General/constitutional: no distress, pleasant HEENT: Normocephalic, PER, Conj Clear, EOMI, Oropharynx clear Neck supple CV: lvad hump Lungs: clear to auscultation, normal respiratory effort Abd: Soft, Nontender; dressing clean/dry; driveline exit site nontender; diastasis of left lower quadrant abd wall noted Ext: no edema Skin: No Rash Neuro: nonfocal MSK: no peripheral joint swelling/tenderness/warmth; back spines nontender    Lab Results Lab Results  Component Value Date   WBC 7.5 07/08/2023   HGB 11.9 (L) 07/08/2023   HCT 36.4 (L) 07/08/2023   MCV 83.5 07/08/2023   PLT 189 07/08/2023    Lab Results  Component Value Date   CREATININE 0.98 07/08/2023   BUN 19 07/08/2023   NA 138 07/08/2023   K 4.2 07/08/2023   CL 101 07/08/2023   CO2 24 07/08/2023    Lab Results  Component Value Date   ALT 24 07/07/2023   AST 17 07/07/2023   ALKPHOS 93 07/07/2023   BILITOT 0.3 07/07/2023      Microbiology: Recent Results (from the past 240 hour(s))  Aerobic Culture w Gram Stain (superficial specimen)     Status: None (Preliminary result)   Collection Time: 07/07/23  1:40 PM   Specimen: Wound  Result Value Ref Range Status   Specimen Description WOUND  Final   Special Requests VAD DRIVELINE  Final   Gram Stain   Final    FEW WBC PRESENT,  PREDOMINANTLY PMN NO ORGANISMS SEEN Performed at [facility], [address], [place], [place] [address]    Culture FEW GRAM NEGATIVE RODS  Final   Report Status PENDING  Incomplete  Blood culture (routine single)     Status: None (Preliminary result)   Collection Time: 07/07/23  2:45 PM   Specimen: BLOOD RIGHT FOREARM  Result Value Ref Range Status   Specimen Description BLOOD RIGHT FOREARM  Final   Special Requests   Final    BOTTLES DRAWN AEROBIC AND ANAEROBIC Blood Culture results may not be optimal due to an inadequate volume of blood received in  culture bottles   Culture   Final    NO GROWTH < 24 HOURS Performed at [facility], [address], [place], [place] [address]    Report Status PENDING  Incomplete  Blood culture (routine single)     Status: None (Preliminary result)   Collection Time: 07/07/23  2:51 PM   Specimen: BLOOD LEFT WRIST  Result Value Ref Range Status   Specimen Description BLOOD LEFT WRIST  Final   Special Requests   Final    BOTTLES DRAWN AEROBIC AND ANAEROBIC Blood Culture adequate volume   Culture   Final    NO GROWTH < 24 HOURS Performed at [facility], [address], [place], [place] [address]    Report Status PENDING  Incomplete  Surgical pcr screen     Status: None   Collection Time: 07/07/23  5:50 PM   Specimen: Nasal Mucosa; Nasal Swab  Result Value Ref Range Status   MRSA, PCR NEGATIVE NEGATIVE Final   Staphylococcus aureus NEGATIVE NEGATIVE Final    Comment: (NOTE) The Xpert SA Assay (FDA approved for NASAL specimens in patients 35 years of age and older), is one component of a comprehensive surveillance program. It is not intended to diagnose infection nor to guide or monitor treatment. Performed at [facility], [address] [place], [place] [address]      Serology:    Imaging: If present, new imagings (plain films, ct scans, and mri) have been personally visualized and interpreted; radiology  reports have been reviewed. Decision making incorporated into the Impression / Recommendations.   7/29 abd xray FINDINGS: The left ventricular assist device is partially imaged. The associated lead is intact, with no fracture or kink.   There is nonobstructive bowel gas pattern. Postsurgical changes are noted in the lower lumbar spine. There is no acute osseous abnormality.   IMPRESSION: Intact LVAD wires.    7/29 ct abd pelv chest 1. Similar appearance of mild circumferential thickening of the fat plane surrounding the drive line in the upper abdomen extending from the pump to the skin which may be chronic or represent an infectious process. No drainable fluid collection. 2. No acute intrathoracic, abdominal, or pelvic pathology. 3. Sigmoid diverticulosis. No bowel obstruction. Normal appendix. 4. A 2 mm nonobstructing left renal upper pole calculus. No hydronephrosis or obstructing stone.   Raymondo Band, MD [facility] [phone] pager    07/08/2023, 12:06 PM

## 2023-07-08 NOTE — Plan of Care (Signed)
  Problem: Education: Goal: Patient will understand all VAD equipment and how it functions Outcome: Progressing Goal: Patient will be able to verbalize current INR target range and antiplatelet therapy for discharge home Outcome: Progressing   Problem: Cardiac: Goal: LVAD will function as expected and patient will experience no clinical alarms Outcome: Progressing   Problem: Education: Goal: Knowledge of General Education information will improve Description: Including pain rating scale, medication(s)/side effects and non-pharmacologic comfort measures Outcome: Progressing   Problem: Health Behavior/Discharge Planning: Goal: Ability to manage health-related needs will improve Outcome: Progressing   Problem: Clinical Measurements: Goal: Ability to maintain clinical measurements within normal limits will improve Outcome: Progressing Goal: Will remain free from infection Outcome: Progressing Goal: Diagnostic test results will improve Outcome: Progressing Goal: Respiratory complications will improve Outcome: Progressing Goal: Cardiovascular complication will be avoided Outcome: Progressing   Problem: Activity: Goal: Risk for activity intolerance will decrease Outcome: Progressing   Problem: Nutrition: Goal: Adequate nutrition will be maintained Outcome: Progressing   Problem: Coping: Goal: Level of anxiety will decrease Outcome: Progressing   Problem: Elimination: Goal: Will not experience complications related to bowel motility Outcome: Progressing Goal: Will not experience complications related to urinary retention Outcome: Progressing   Problem: Safety: Goal: Ability to remain free from injury will improve Outcome: Progressing   Problem: Skin Integrity: Goal: Risk for impaired skin integrity will decrease Outcome: Progressing

## 2023-07-08 NOTE — Progress Notes (Signed)
Advanced Heart Failure VAD Team Note  PCP-Cardiologist: Nona Dell, MD  [facility]: Dr. Shirlee Latch   Subjective:    7/29: admitted w/ suspected DL fracture and DL infection. P/w "driveline communication fault" error message on LVAD. No pump stoppages. LVAD coordinator replaced modular cable and controller with resolution of driveline communication fault signal. CT C/A/P c/w prior imaging w/ similar appearance of mild circumferential thickening of the fat plane surrounding the drive line in the upper abdomen extending from the pump to the skin which may be chronic or represent an infectious process. No drainable fluid collection. Wound + BCx collected. Started on empiric daptomycin/Unasyn per ID.    Cultures pending. WBC WNL. Afebrile. Denies subjective f/c. No dyspnea. Feels ok. No complaints.    LVAD INTERROGATION:  HeartMate III LVAD:   Flow 4.2 liters/min, speed 5600, power 4.3, PI 3.9.  numerous PI events   Objective:    Vital Signs:   Temp:  [97.7 F (36.5 C)-98.3 F (36.8 C)] 97.7 F (36.5 C) (07/30 0626) Pulse Rate:  [87-100] 100 (07/30 0626) Resp:  [14-19] 19 (07/29 2319) BP: (137-144)/(93-102) 137/93 (07/29 2319) SpO2:  [97 %] 97 % (07/30 0626) Weight:  [87.7 kg-91.4 kg] 87.7 kg (07/30 0626) Last BM Date : 07/07/23 Mean arterial Pressure low 100s   Intake/Output:   Intake/Output Summary (Last 24 hours) at 07/08/2023 0725 Last data filed at 07/08/2023 0630 Gross per 24 hour  Intake 540.26 ml  Output 1300 ml  Net -759.74 ml     Physical Exam    General:  Well appearing. No resp difficulty HEENT: normal Neck: supple. JVP not elevated . Carotids 2+ bilat; no bruits. No lymphadenopathy or thyromegaly appreciated. Cor: Mechanical heart sounds with LVAD hum present. Lungs: clear Abdomen: soft, nontender, nondistended. No hepatosplenomegaly. No bruits or masses. Good bowel sounds. Large ventral hernia, Soft and able to reduce  Driveline: C/D/I; securement device intact  and driveline incorporated Extremities: no cyanosis, clubbing, rash, edema Neuro: alert & orientedx3, cranial nerves grossly intact. moves all 4 extremities w/o difficulty. Affect pleasant   Telemetry   NSR 80s   EKG    N/A   Labs   Basic Metabolic Panel: Recent Labs  Lab 07/07/23 1436  NA 136  K 3.7  CL 103  CO2 23  GLUCOSE 200*  BUN 24*  CREATININE 1.25*  CALCIUM 8.6*  MG 1.6*    Liver Function Tests: Recent Labs  Lab 07/07/23 1436  AST 17  ALT 24  ALKPHOS 93  BILITOT 0.3  PROT 6.0*  ALBUMIN 3.0*   No results for input(s): "LIPASE", "AMYLASE" in the last 168 hours. No results for input(s): "AMMONIA" in the last 168 hours.  CBC: Recent Labs  Lab 07/07/23 1436  WBC 7.8  HGB 11.8*  HCT 35.6*  MCV 85.4  PLT 199    INR: Recent Labs  Lab 07/07/23 1436  INR 1.0    Other results: EKG:    Imaging   CT CHEST ABDOMEN PELVIS WO CONTRAST  Result Date: 07/07/2023 CLINICAL DATA:  Sepsis.  Concern for drive line infection. EXAM: CT CHEST, ABDOMEN AND PELVIS WITHOUT CONTRAST TECHNIQUE: Multidetector CT imaging of the chest, abdomen and pelvis was performed following the standard protocol without IV contrast. RADIATION DOSE REDUCTION: This exam was performed according to the departmental dose-optimization program which includes automated exposure control, adjustment of the mA and/or kV according to patient size and/or use of iterative reconstruction technique. COMPARISON:  CT dated 01/27/2023. FINDINGS: Evaluation of this exam is  limited in the absence of intravenous contrast. CT CHEST FINDINGS Cardiovascular: There is no cardiomegaly or pericardial effusion. Three-vessel coronary vascular calcification. Left ventricular assist device with associated streak artifact limiting evaluation of the heart and device. There is similar appearance of mild circumferential thickening of the fat plane surrounding the drive line in the upper abdomen extending from the pump to  the skin which may be chronic or represent an infectious process. Clinical correlation is recommended. No drainable fluid collection. There is moderate atherosclerotic calcification of the thoracic aorta. No aneurysmal dilatation. The central pulmonary arteries are grossly unremarkable. Mediastinum/Nodes: No hilar or mediastinal adenopathy. The esophagus and the thyroid gland are grossly unremarkable. No mediastinal fluid collection. Lungs/Pleura: Background of emphysema. There are bibasilar linear atelectasis/scarring. No focal consolidation, pleural effusion, or pneumothorax. The central airways are patent. Musculoskeletal: Degenerative changes of the spine. Median sternotomy wires. No acute osseous pathology. CT ABDOMEN PELVIS FINDINGS No intra-abdominal free air or free fluid. Hepatobiliary: The liver is unremarkable. No biliary dilatation. The gallbladder is unremarkable. Pancreas: Unremarkable. No pancreatic ductal dilatation or surrounding inflammatory changes. Spleen: Normal in size without focal abnormality. Adrenals/Urinary Tract: The adrenal glands are unremarkable. There is a 2 mm nonobstructing left renal upper pole calculus. No hydronephrosis or obstructing stone. There is no hydronephrosis or nephrolithiasis on the right. The visualized ureters and urinary bladder appear unremarkable. Stomach/Bowel: There is moderate stool throughout the colon. There is sigmoid diverticulosis without active inflammatory changes. There is no bowel obstruction or active inflammation. The appendix is normal. Vascular/Lymphatic: Advanced aortoiliac atherosclerotic disease. The IVC is unremarkable. No portal venous gas. There is no adenopathy. Reproductive: The prostate and seminal vesicles are grossly unremarkable. No pelvic mass. Other: There is diastasis of anterior abdominal wall musculature with a broad-based hernia containing loops of small bowel. The neck of the hernia defect measures approximately 16 cm in axial  diameter. Musculoskeletal: Osteopenia with degenerative changes of the spine. No acute osseous pathology. Lower lumbar posterior fusion. IMPRESSION: 1. Similar appearance of mild circumferential thickening of the fat plane surrounding the drive line in the upper abdomen extending from the pump to the skin which may be chronic or represent an infectious process. No drainable fluid collection. 2. No acute intrathoracic, abdominal, or pelvic pathology. 3. Sigmoid diverticulosis. No bowel obstruction. Normal appendix. 4. A 2 mm nonobstructing left renal upper pole calculus. No hydronephrosis or obstructing stone. 5. Aortic Atherosclerosis (ICD10-I70.0) and Emphysema (ICD10-J43.9). Electronically Signed   By: Elgie Collard M.D.   On: 07/07/2023 22:19   DG Abd 1 View  Result Date: 07/07/2023 CLINICAL DATA:  Checking left ventricular assist device wires. EXAM: ABDOMEN - 1 VIEW COMPARISON:  KUB 02/04/2023. FINDINGS: The left ventricular assist device is partially imaged. The associated lead is intact, with no fracture or kink. There is nonobstructive bowel gas pattern. Postsurgical changes are noted in the lower lumbar spine. There is no acute osseous abnormality. IMPRESSION: Intact LVAD wires. Electronically Signed   By: Lesia Hausen M.D.   On: 07/07/2023 14:46     Medications:     Scheduled Medications:  allopurinol  100 mg Oral Daily   clonazepam  0.25 mg Oral BID   feeding supplement  237 mL Oral TID WC   fluticasone furoate-vilanterol  1 puff Inhalation Daily   And   umeclidinium bromide  1 puff Inhalation Daily   metoprolol succinate  25 mg Oral Daily   mirtazapine  15 mg Oral QHS   pantoprazole  40 mg Oral Daily  sertraline  50 mg Oral Daily   tamsulosin  0.4 mg Oral Daily   thiamine  100 mg Oral Daily   Warfarin - Pharmacist Dosing Inpatient   Does not apply q1600   zinc sulfate  220 mg Oral Daily    Infusions:  ampicillin-sulbactam (UNASYN) IV 3 g (07/08/23 0604)   DAPTOmycin  (CUBICIN) 800 mg in sodium chloride 0.9 % IVPB 800 mg (07/07/23 2106)   heparin 500 Units/hr (07/07/23 2313)    PRN Medications: acetaminophen, fluticasone   Patient Profile   62 y.o. 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 type 2 diabetes mellitus, COPD, and hypertension. Now s/p Heartmate 3 LVAD. Post implant course c/b repeated DL infections. Now admitted w/ suspected DL fracture and DL infection. P/w "driveline communication fault" error message on LVAD. No pump stoppages. Also w/ significant greenish drainage from DL, in setting of poor compliance w/ suppressive minocycline. Recent fall ~2 wks prior, likely cause of DL trauma.    Assessment/Plan:    1.  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  Cultures have grown acinetobacter and MRSA. He was readmitted with driveline infection in 2/24, completed course of daptomycin for MRSA. DL site with new green/yellow drainage. Now readmitted w/ suspected DL fracture and DL infection. P/w "driveline communication fault" error message on LVAD. No pump stoppages. Also w/ significant greenish drainage from DL, in setting of poor compliance w/ suppressive minocycline. Recent fall ~2 wks prior, likely cause of DL trauma. LVAD coordinator replaced modular cable and controller with resolution of driveline communication fault signal. CT C/A/P c/w prior imaging w/ similar appearance of mild circumferential thickening of the fat plane surrounding the drive line in the upper abdomen extending from the pump to the skin which may be chronic or represent an infectious process. No drainable fluid collection. Wound + BCx collected. Started on empiric daptomycin/Unasyn per ID.  - remains on abx. No  fever/chills. WBC WNL  - Wound and BCx pending  - Appreciate ID  - D/w Dr. Maren Beach, plan I&D tomorrow   2. Suspected DL fracture with multiple recent falls - Has had multiple falls during the last 2 months, one direct trauma to mid sternum (fell on cinder block) - CT chest/abd/pelvis per above  - CXR w/ LVAD partially imaged. The associated lead noted to be intact w/ no fracture or kink  - plan to send imaging to [facility] - LVAD coordinators to changed modular cable and controller - Not candidate for VAD exchange with recurrent drug abuse  3.  Chronic systolic CHF: Echo 10/21 with EF 20-25%, mildly decreased RV function. LHC/RHC in 12/21 with patent grafts, low output. Suspect mixed ischemic/nonischemic cardiomyopathy (prior heavy ETOH and drugs as well as CAD). Admitted with cardiogenic shock in 12/21, had placement of Impella 5.5 initially, now s/p Heartmate 3 LVAD on 11/17/20.  Ramp echo 2/23 with speed decreased to 5500 rpm. Speed now back to 5600 rpm.   - He is not volume overloaded on exam.      - Plan for ramp echo today  - Continue current Toprol XL 25 mg daily.  - He does not need a diuretic.   - INR 1.0 today. INR goal 2-2.5. Covering w/ heparin gtt ahead of OR. Holding coumadin.  4. CKD stage 3: Prior CTA abdomen/pelvis with high-grade stenosis of proximal right renal artery.  - Would consider eventual reassessment of renal artery stenosis and possible treatment  - follow labs. BMP pending   5.  CAD: S/p CABG 12/20.  LHC pre-VAD with patent grafts, no target for intervention. - Continue Crestor 20 mg daily.   6.  Atrial fibrillation: Paroxysmal.  S/p Maze and LA appendage clip with CABG in 12/20. Maintaining SR.  - Continue Toprol XL 25 mg daily.  - Off amiodarone with hyperthyroidism.  7. Type 2 diabetes: check A1c, last 8.2 6/23   8. 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 He says last use of methamphetamine was 1 month ago.  - check urine drug  screen  9. 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  Followed by endocrinology. He is now off amiodarone. He had been on methimazole but has not been taking recently. TSH was high on admit and free T4 normal  - Will NOT restart methimazole   10. Anemia (chronic blood loss):  Hgb 11.9. No gross bleeding.  - follow CBP   11. COPD: Prior smoker.  - Continue Trelegy.   12. PVCs/NSVT:  - Now off amiodarone.   13. Depression: Has turned to substance abuse to cope.   - Has been referred to OP psych in [place]  - Consult TOC while IP to assist with substance abuse   14.  Gout: Continue allopurinol, he has prn colchicine.   15. HTN: MAPs elevated. In setting of known Rt RAS.  - monitor closely, if remains elevated may need reassessment of renal artery stenosis and possible treatment - may need PRN hydralazine   I reviewed the LVAD parameters from today, and compared the results to the patient's prior recorded data.  No programming changes were made.  The LVAD is functioning within specified parameters.  The patient performs LVAD self-test daily.  LVAD interrogation was negative for any significant power changes, alarms or PI events/speed drops.  LVAD equipment check completed and is in good working order.  Back-up equipment present.   LVAD education done on emergency procedures and precautions and reviewed exit site care.  Length of Stay: 1  Knute Neu 07/08/2023, 7:25 AM  VAD Team --- VAD ISSUES ONLY--- Pager [phone] (7am - 7am)  Advanced Heart Failure Team  Pager [phone] (M-F; 7a - 5p)  Please contact [facility] for night-coverage after hours (5p -7a ) and weekends on [email or web address]

## 2023-07-08 NOTE — Progress Notes (Signed)
Followed up with phlebotomist the order for type and x match  ordered this afternoon and claimed to check on it and send someone to collect it.

## 2023-07-09 ENCOUNTER — Inpatient Hospital Stay (HOSPITAL_COMMUNITY): Payer: 59 | Admitting: Anesthesiology

## 2023-07-09 ENCOUNTER — Other Ambulatory Visit: Payer: Self-pay

## 2023-07-09 ENCOUNTER — Encounter (HOSPITAL_COMMUNITY)
Admission: AD | Disposition: A | Discharge status: Home / Self Care | Payer: Self-pay | Source: Ambulatory Visit | Attending: Internal Medicine

## 2023-07-09 ENCOUNTER — Encounter (HOSPITAL_COMMUNITY): Payer: Self-pay | Admitting: Cardiology

## 2023-07-09 DIAGNOSIS — I25110 Atherosclerotic heart disease of native coronary artery with unstable angina pectoris: Secondary | ICD-10-CM

## 2023-07-09 DIAGNOSIS — T827XXA Infection and inflammatory reaction due to other cardiac and vascular devices, implants and grafts, initial encounter: Secondary | ICD-10-CM | POA: Diagnosis not present

## 2023-07-09 DIAGNOSIS — I5023 Acute on chronic systolic (congestive) heart failure: Secondary | ICD-10-CM

## 2023-07-09 DIAGNOSIS — I130 Hypertensive heart and chronic kidney disease with heart failure and stage 1 through stage 4 chronic kidney disease, or unspecified chronic kidney disease: Secondary | ICD-10-CM | POA: Diagnosis not present

## 2023-07-09 DIAGNOSIS — T80212A Local infection due to central venous catheter, initial encounter: Secondary | ICD-10-CM

## 2023-07-09 HISTORY — PX: STERNAL WOUND DEBRIDEMENT: SHX1058

## 2023-07-09 HISTORY — PX: APPLICATION OF WOUND VAC: SHX5189

## 2023-07-09 LAB — GLUCOSE, CAPILLARY
Glucose-Capillary: 170 mg/dL — ABNORMAL HIGH (ref 70–99)
Glucose-Capillary: 123 mg/dL — ABNORMAL HIGH (ref 70–99)
Glucose-Capillary: 223 mg/dL — ABNORMAL HIGH (ref 70–99)
Glucose-Capillary: 386 mg/dL — ABNORMAL HIGH (ref 70–99)

## 2023-07-09 LAB — AEROBIC/ANAEROBIC CULTURE W GRAM STAIN (SURGICAL/DEEP WOUND): Gram Stain: NONE SEEN

## 2023-07-09 SURGERY — DEBRIDEMENT, WOUND, STERNUM
Anesthesia: General

## 2023-07-09 MED ORDER — ETOMIDATE 2 MG/ML IV SOLN
INTRAVENOUS | Status: DC | PRN
  Administered 2023-07-09: 12 mg via INTRAVENOUS

## 2023-07-09 MED ORDER — ONDANSETRON HCL 4 MG/2ML IJ SOLN
INTRAMUSCULAR | Status: AC
  Filled 2023-07-09: qty 2

## 2023-07-09 MED ORDER — INSULIN ASPART 100 UNIT/ML IJ SOLN
INTRAMUSCULAR | Status: AC
  Filled 2023-07-09: qty 1

## 2023-07-09 MED ORDER — MIDAZOLAM HCL 2 MG/2ML IJ SOLN
INTRAMUSCULAR | Status: DC | PRN
  Administered 2023-07-09: 2 mg via INTRAVENOUS

## 2023-07-09 MED ORDER — FENTANYL CITRATE (PF) 250 MCG/5ML IJ SOLN
INTRAMUSCULAR | Status: DC | PRN
  Administered 2023-07-09: 100 ug via INTRAVENOUS

## 2023-07-09 MED ORDER — ACETAMINOPHEN 10 MG/ML IV SOLN: 1000.0000 mg | Freq: Once | INTRAVENOUS | Status: DC | PRN

## 2023-07-09 MED ORDER — VASHE WOUND IRRIGATION OPTIME
TOPICAL | Status: DC | PRN
  Administered 2023-07-09 (×2): 34 [oz_av]

## 2023-07-09 MED ORDER — ORAL CARE MOUTH RINSE: 15.0000 mL | Freq: Once | OROMUCOSAL | Status: AC

## 2023-07-09 MED ORDER — DEXAMETHASONE SODIUM PHOSPHATE 10 MG/ML IJ SOLN
INTRAMUSCULAR | Status: AC
  Filled 2023-07-09: qty 1

## 2023-07-09 MED ORDER — PHENYLEPHRINE HCL (PRESSORS) 10 MG/ML IV SOLN
INTRAVENOUS | Status: AC
  Filled 2023-07-09: qty 1

## 2023-07-09 MED ORDER — SODIUM CHLORIDE 0.9 % IV SOLN
2.0000 g | Freq: Three times a day (TID) | INTRAVENOUS | Status: DC
  Administered 2023-07-09 – 2023-08-07 (×87): 2 g via INTRAVENOUS
  Filled 2023-07-09 (×88): qty 12.5

## 2023-07-09 MED ORDER — FENTANYL CITRATE (PF) 250 MCG/5ML IJ SOLN
INTRAMUSCULAR | Status: AC
  Filled 2023-07-09: qty 5

## 2023-07-09 MED ORDER — MINOCYCLINE HCL 50 MG PO CAPS
200.0000 mg | ORAL_CAPSULE | Freq: Two times a day (BID) | ORAL | Status: DC
  Administered 2023-07-09 – 2023-08-15 (×73): 200 mg via ORAL
  Filled 2023-07-09 (×28): qty 4
  Filled 2023-07-09: qty 2
  Filled 2023-07-09 (×46): qty 4
  Filled 2023-07-09: qty 2
  Filled 2023-07-09: qty 4

## 2023-07-09 MED ORDER — PHENYLEPHRINE 80 MCG/ML (10ML) SYRINGE FOR IV PUSH (FOR BLOOD PRESSURE SUPPORT)
PREFILLED_SYRINGE | INTRAVENOUS | Status: DC | PRN
  Administered 2023-07-09 (×2): 80 ug via INTRAVENOUS

## 2023-07-09 MED ORDER — WARFARIN SODIUM 3 MG PO TABS
6.0000 mg | ORAL_TABLET | Freq: Once | ORAL | Status: AC
  Administered 2023-07-09: 6 mg via ORAL
  Filled 2023-07-09: qty 2

## 2023-07-09 MED ORDER — OXYCODONE HCL 5 MG PO TABS: 5.0000 mg | ORAL_TABLET | Freq: Once | ORAL | Status: DC | PRN

## 2023-07-09 MED ORDER — SUCCINYLCHOLINE CHLORIDE 200 MG/10ML IV SOSY
PREFILLED_SYRINGE | INTRAVENOUS | Status: AC
  Filled 2023-07-09: qty 20

## 2023-07-09 MED ORDER — INSULIN ASPART 100 UNIT/ML IJ SOLN
0.0000 [IU] | Freq: Three times a day (TID) | INTRAMUSCULAR | Status: DC
  Administered 2023-07-09 – 2023-07-10 (×3): 5 [IU] via SUBCUTANEOUS
  Administered 2023-07-10: 11 [IU] via SUBCUTANEOUS
  Administered 2023-07-11: 3 [IU] via SUBCUTANEOUS
  Administered 2023-07-11: 8 [IU] via SUBCUTANEOUS
  Administered 2023-07-11 – 2023-07-12 (×4): 3 [IU] via SUBCUTANEOUS
  Administered 2023-07-13: 5 [IU] via SUBCUTANEOUS
  Administered 2023-07-13: 11 [IU] via SUBCUTANEOUS
  Administered 2023-07-13: 3 [IU] via SUBCUTANEOUS
  Administered 2023-07-14: 8 [IU] via SUBCUTANEOUS
  Administered 2023-07-14 (×2): 3 [IU] via SUBCUTANEOUS
  Administered 2023-07-15: 8 [IU] via SUBCUTANEOUS
  Administered 2023-07-15 (×2): 3 [IU] via SUBCUTANEOUS
  Administered 2023-07-16 (×2): 8 [IU] via SUBCUTANEOUS
  Administered 2023-07-16: 3 [IU] via SUBCUTANEOUS
  Administered 2023-07-17: 2 [IU] via SUBCUTANEOUS
  Administered 2023-07-17: 5 [IU] via SUBCUTANEOUS
  Administered 2023-07-17: 2 [IU] via SUBCUTANEOUS
  Administered 2023-07-18 – 2023-07-19 (×6): 5 [IU] via SUBCUTANEOUS
  Administered 2023-07-20: 8 [IU] via SUBCUTANEOUS
  Administered 2023-07-20: 5 [IU] via SUBCUTANEOUS
  Administered 2023-07-20: 3 [IU] via SUBCUTANEOUS
  Administered 2023-07-21: 8 [IU] via SUBCUTANEOUS
  Administered 2023-07-21: 5 [IU] via SUBCUTANEOUS

## 2023-07-09 MED ORDER — ETOMIDATE 2 MG/ML IV SOLN
INTRAVENOUS | Status: AC
  Filled 2023-07-09: qty 10

## 2023-07-09 MED ORDER — SODIUM CHLORIDE 0.9 % IR SOLN
Status: DC | PRN
  Administered 2023-07-09: 2000 mL

## 2023-07-09 MED ORDER — LACTATED RINGERS IV SOLN: INTRAVENOUS | Status: DC

## 2023-07-09 MED ORDER — AMISULPRIDE (ANTIEMETIC) 5 MG/2ML IV SOLN: 10.0000 mg | Freq: Once | INTRAVENOUS | Status: DC | PRN

## 2023-07-09 MED ORDER — PHENYLEPHRINE 80 MCG/ML (10ML) SYRINGE FOR IV PUSH (FOR BLOOD PRESSURE SUPPORT)
PREFILLED_SYRINGE | INTRAVENOUS | Status: AC
  Filled 2023-07-09: qty 10

## 2023-07-09 MED ORDER — SUCCINYLCHOLINE CHLORIDE 200 MG/10ML IV SOSY
PREFILLED_SYRINGE | INTRAVENOUS | Status: DC | PRN
  Administered 2023-07-09: 100 mg via INTRAVENOUS

## 2023-07-09 MED ORDER — OXYCODONE HCL 5 MG/5ML PO SOLN: 5.0000 mg | Freq: Once | ORAL | Status: DC | PRN

## 2023-07-09 MED ORDER — PROPOFOL 1000 MG/100ML IV EMUL
INTRAVENOUS | Status: AC
  Filled 2023-07-09: qty 100

## 2023-07-09 MED ORDER — DEXAMETHASONE SODIUM PHOSPHATE 10 MG/ML IJ SOLN
INTRAMUSCULAR | Status: DC | PRN
  Administered 2023-07-09: 10 mg via INTRAVENOUS

## 2023-07-09 MED ORDER — HEPARIN (PORCINE) 25000 UT/250ML-% IV SOLN
500.0000 [IU]/h | INTRAVENOUS | Status: DC
  Administered 2023-07-09 – 2023-07-15 (×4): 500 [IU]/h via INTRAVENOUS
  Filled 2023-07-09 (×4): qty 250

## 2023-07-09 MED ORDER — FENTANYL CITRATE (PF) 100 MCG/2ML IJ SOLN: 25.0000 ug | INTRAMUSCULAR | Status: DC | PRN

## 2023-07-09 MED ORDER — NOREPINEPHRINE 4 MG/250ML-% IV SOLN
INTRAVENOUS | Status: DC | PRN
  Administered 2023-07-09: 4 ug/min via INTRAVENOUS

## 2023-07-09 MED ORDER — ALBUMIN HUMAN 5 % IV SOLN
12.5000 g | Freq: Four times a day (QID) | INTRAVENOUS | Status: AC
  Administered 2023-07-09 (×2): 12.5 g via INTRAVENOUS
  Filled 2023-07-09 (×2): qty 250

## 2023-07-09 MED ORDER — ACETAMINOPHEN 500 MG PO TABS
ORAL_TABLET | ORAL | Status: AC
  Filled 2023-07-09: qty 2

## 2023-07-09 MED ORDER — ALBUMIN HUMAN 5 % IV SOLN: INTRAVENOUS | Status: DC | PRN

## 2023-07-09 MED ORDER — LABETALOL HCL 5 MG/ML IV SOLN
INTRAVENOUS | Status: AC
  Filled 2023-07-09: qty 4

## 2023-07-09 MED ORDER — MIDAZOLAM HCL 2 MG/2ML IJ SOLN
INTRAMUSCULAR | Status: AC
  Filled 2023-07-09: qty 2

## 2023-07-09 MED ORDER — INSULIN ASPART 100 UNIT/ML IJ SOLN
0.0000 [IU] | INTRAMUSCULAR | Status: DC | PRN
  Administered 2023-07-09: 2 [IU] via SUBCUTANEOUS

## 2023-07-09 MED ORDER — PHENYLEPHRINE HCL-NACL 20-0.9 MG/250ML-% IV SOLN
INTRAVENOUS | Status: DC | PRN
  Administered 2023-07-09: 45 ug/min via INTRAVENOUS

## 2023-07-09 MED ORDER — ONDANSETRON HCL 4 MG/2ML IJ SOLN
INTRAMUSCULAR | Status: DC | PRN
  Administered 2023-07-09: 4 mg via INTRAVENOUS

## 2023-07-09 MED ORDER — CHLORHEXIDINE GLUCONATE 0.12 % MT SOLN
15.0000 mL | Freq: Once | OROMUCOSAL | Status: AC
  Administered 2023-07-09: 15 mL via OROMUCOSAL

## 2023-07-09 MED ORDER — EPHEDRINE SULFATE-NACL 50-0.9 MG/10ML-% IV SOSY
PREFILLED_SYRINGE | INTRAVENOUS | Status: DC | PRN
  Administered 2023-07-09 (×2): 5 mg via INTRAVENOUS

## 2023-07-09 MED ORDER — ACETAMINOPHEN 500 MG PO TABS
1000.0000 mg | ORAL_TABLET | Freq: Once | ORAL | Status: AC
  Administered 2023-07-09: 1000 mg via ORAL

## 2023-07-09 SURGICAL SUPPLY — 65 items
APL SKNCLS STERI-STRIP NONHPOA (GAUZE/BANDAGES/DRESSINGS)
ATTRACTOMAT 16X20 MAGNETIC DRP (DRAPES) ×1 IMPLANT
BAG DECANTER FOR FLEXI CONT (MISCELLANEOUS) ×1 IMPLANT
BENZOIN TINCTURE AMPULE (MISCELLANEOUS) IMPLANT
BENZOIN TINCTURE PRP APPL 2/3 (GAUZE/BANDAGES/DRESSINGS) IMPLANT
BINDER ABDOMINAL 12 ML 46-62 (SOFTGOODS) IMPLANT
BLADE CLIPPER SURG (BLADE) ×1 IMPLANT
BLADE SURG 10 STRL SS (BLADE) ×1 IMPLANT
BLADE SURG 15 STRL LF DISP TIS (BLADE) IMPLANT
BLADE SURG 15 STRL SS (BLADE)
BNDG GAUZE DERMACEA FLUFF 4 (GAUZE/BANDAGES/DRESSINGS) IMPLANT
BNDG GZE DERMACEA 4 6PLY (GAUZE/BANDAGES/DRESSINGS)
CANISTER SUCT 3000ML PPV (MISCELLANEOUS) ×1 IMPLANT
CANISTER WOUNDNEG PRESSURE 500 (CANNISTER) IMPLANT
CASSETTE VERAFLO VERALINK (MISCELLANEOUS) IMPLANT
CATH FOLEY 2WAY SLVR 5CC 16FR (CATHETERS) IMPLANT
CATH THORACIC 28FR RT ANG (CATHETERS) IMPLANT
CATH THORACIC 36FR (CATHETERS) IMPLANT
CLIP TI WIDE RED SMALL 24 (CLIP) IMPLANT
CNTNR URN SCR LID CUP LEK RST (MISCELLANEOUS) IMPLANT
CONN Y 3/8X3/8X3/8 BEN (MISCELLANEOUS) IMPLANT
CONNECTOR Y WND VAC (MISCELLANEOUS) IMPLANT
CONT SPEC 4OZ STRL OR WHT (MISCELLANEOUS)
DRAPE DERMATAC (DRAPES) IMPLANT
DRAPE LAPAROSCOPIC ABDOMINAL (DRAPES) ×1 IMPLANT
DRAPE SLUSH/WARMER DISC (DRAPES) IMPLANT
DRAPE WARM FLUID 44X44 (DRAPES) IMPLANT
DRESSING VERAFLO CLEANS CC MED (GAUZE/BANDAGES/DRESSINGS) IMPLANT
DRSG AQUACEL AG ADV 3.5X14 (GAUZE/BANDAGES/DRESSINGS) ×1 IMPLANT
DRSG HYDROCOLLOID 4X4 (GAUZE/BANDAGES/DRESSINGS) IMPLANT
DRSG VERAFLO CLEANSE CC MED (GAUZE/BANDAGES/DRESSINGS) ×1
ELECT BLADE 4.0 EZ CLEAN MEGAD (MISCELLANEOUS) ×1
ELECT REM PT RETURN 9FT ADLT (ELECTROSURGICAL)
ELECTRODE BLDE 4.0 EZ CLN MEGD (MISCELLANEOUS) IMPLANT
ELECTRODE REM PT RTRN 9FT ADLT (ELECTROSURGICAL) ×1 IMPLANT
GAUZE PAD ABD 8X10 STRL (GAUZE/BANDAGES/DRESSINGS) IMPLANT
GAUZE SPONGE 4X4 12PLY STRL (GAUZE/BANDAGES/DRESSINGS) ×1 IMPLANT
GAUZE XEROFORM 5X9 LF (GAUZE/BANDAGES/DRESSINGS) IMPLANT
GLOVE BIO SURGEON STRL SZ7.5 (GLOVE) ×2 IMPLANT
GOWN STRL REUS W/ TWL LRG LVL3 (GOWN DISPOSABLE) ×4 IMPLANT
GOWN STRL REUS W/TWL LRG LVL3 (GOWN DISPOSABLE) ×4
HANDPIECE INTERPULSE COAX TIP (DISPOSABLE) ×1
HEMOSTAT POWDER SURGIFOAM 1G (HEMOSTASIS) IMPLANT
HEMOSTAT SURGICEL 2X14 (HEMOSTASIS) IMPLANT
KIT BASIN OR (CUSTOM PROCEDURE TRAY) ×1 IMPLANT
KIT SUCTION CATH 14FR (SUCTIONS) IMPLANT
KIT TURNOVER KIT B (KITS) ×1 IMPLANT
NS IRRIG 1000ML POUR BTL (IV SOLUTION) ×1 IMPLANT
PACK GENERAL/GYN (CUSTOM PROCEDURE TRAY) ×1 IMPLANT
PAD ARMBOARD 7.5X6 YLW CONV (MISCELLANEOUS) ×2 IMPLANT
PAD NEG PRESSURE SENSATRAC (MISCELLANEOUS) IMPLANT
SET HNDPC FAN SPRY TIP SCT (DISPOSABLE) ×1 IMPLANT
SOL PREP POV-IOD 4OZ 10% (MISCELLANEOUS) IMPLANT
STAPLER VISISTAT 35W (STAPLE) IMPLANT
SUT ETHILON 3 0 FSL (SUTURE) IMPLANT
SUT STEEL 6MS V (SUTURE) IMPLANT
SUT STEEL STERNAL CCS#1 18IN (SUTURE) IMPLANT
SUT STEEL SZ 6 DBL 3X14 BALL (SUTURE) IMPLANT
SWAB COLLECTION DEVICE MRSA (MISCELLANEOUS) IMPLANT
SWAB CULTURE ESWAB REG 1ML (MISCELLANEOUS) IMPLANT
SYR 5ML LL (SYRINGE) IMPLANT
TOWEL GREEN STERILE (TOWEL DISPOSABLE) ×1 IMPLANT
TOWEL GREEN STERILE FF (TOWEL DISPOSABLE) ×1 IMPLANT
TRAY FOLEY MTR SLVR 16FR STAT (SET/KITS/TRAYS/PACK) IMPLANT
WATER STERILE IRR 1000ML POUR (IV SOLUTION) ×1 IMPLANT

## 2023-07-09 NOTE — Transfer of Care (Signed)
Immediate Anesthesia Transfer of Care Note  Patient: Kevin Underwood.  Procedure(s) Performed: DRIVELINE WOUND DEBRIDEMENT APPLICATION OF WOUND VAC  Patient Location: PACU  Anesthesia Type:General  Level of Consciousness: drowsy and patient cooperative  Airway & Oxygen Therapy: Patient Spontanous Breathing  Post-op Assessment: Report given to RN, Post -op Vital signs reviewed and stable, and Patient moving all extremities X 4  Post vital signs: Reviewed and stable  Last Vitals:  Vitals Value Taken Time  BP 116/104 07/09/23 1232  Temp    Pulse 88 07/09/23 1236  Resp 14 07/09/23 1236  SpO2 100 % 07/09/23 1236  Vitals shown include unfiled device data.  Last Pain:  Vitals:   07/09/23 1004  TempSrc: Oral  PainSc: 0-No pain         Complications: No notable events documented.

## 2023-07-09 NOTE — Progress Notes (Signed)
Advanced Heart Failure VAD Team Note  PCP-Cardiologist: Nona Dell, MD  [facility]: Dr. Shirlee Latch   Subjective:    7/29: admitted w/ suspected DL fracture and DL infection. P/w "driveline communication fault" error message on LVAD. No pump stoppages. LVAD coordinator replaced modular cable and controller with resolution of driveline communication fault signal. CT C/A/P c/w prior imaging w/ similar appearance of mild circumferential thickening of the fat plane surrounding the drive line in the upper abdomen extending from the pump to the skin which may be chronic or represent an infectious process. No drainable fluid collection. Wound + BCx collected. Started on empiric daptomycin/Unasyn per ID.  7/30: Wound Cx growing Pseudomonas Aeruginosa   Remains on Dapto and Unasyn. Wound Cx growing Pseudomonas Aeruginosa. Susceptibilities pending. Bcx NGTD. Afebrile. WBC WNL. Going to OR today for wound debridement and application of wound vac.   On low dose heparin. INR 1.1   Feels ok. Denies f/c. No abdominal pain. Denies CP and dyspnea.    LVAD INTERROGATION:  HeartMate III LVAD:   Flow 4.2 liters/min, speed 5600, power 4.0, PI 5.5.  numerous PI events   Objective:    Vital Signs:   Temp:  [97.9 F (36.6 C)-98.9 F (37.2 C)] 98.2 F (36.8 C) (07/31 0816) Pulse Rate:  [62-96] 94 (07/31 0816) Resp:  [13-20] 13 (07/31 0816) BP: (106-131)/(94-109) 131/103 (07/31 0816) SpO2:  [91 %-97 %] 97 % (07/31 0816) Weight:  [88.4 kg] 88.4 kg (07/31 0716) Last BM Date : 07/08/23 Mean arterial Pressure low 100s   Intake/Output:   Intake/Output Summary (Last 24 hours) at 07/09/2023 0824 Last data filed at 07/09/2023 0500 Gross per 24 hour  Intake 1722 ml  Output 1850 ml  Net -128 ml     Physical Exam    General:  Well appearing, laying flat in bed. No resp difficulty HEENT: normal Neck: supple. No JVD. Carotids 2+ bilat; no bruits. No lymphadenopathy or thyromegaly appreciated. Cor: Mechanical  heart sounds with LVAD hum present. Lungs: CTAB  Abdomen: soft, nontender, nondistended. No hepatosplenomegaly. No bruits or masses. Good bowel sounds. Large ventral hernia, Soft and easy to reduce  Driveline: C/D/I; securement device intact and driveline incorporated Extremities: no cyanosis, clubbing, rash, no edema Neuro: alert & orientedx3, cranial nerves grossly intact. moves all 4 extremities w/o difficulty. Affect pleasant   Telemetry   NSR 70s-80s   EKG    N/A   Labs   Basic Metabolic Panel: Recent Labs  Lab 07/07/23 1436 07/08/23 0753 07/09/23 0258  NA 136 138 135  K 3.7 4.2 3.7  CL 103 101 99  CO2 23 24 24   GLUCOSE 200* 207* 201*  BUN 24* 19 21  CREATININE 1.25* 0.98 0.99  CALCIUM 8.6* 9.0 8.8*  MG 1.6*  --   --     Liver Function Tests: Recent Labs  Lab 07/07/23 1436  AST 17  ALT 24  ALKPHOS 93  BILITOT 0.3  PROT 6.0*  ALBUMIN 3.0*   No results for input(s): "LIPASE", "AMYLASE" in the last 168 hours. No results for input(s): "AMMONIA" in the last 168 hours.  CBC: Recent Labs  Lab 07/07/23 1436 07/08/23 0753 07/09/23 0258  WBC 7.8 7.5 6.8  HGB 11.8* 11.9* 11.6*  HCT 35.6* 36.4* 34.6*  MCV 85.4 83.5 82.2  PLT 199 189 187    INR: Recent Labs  Lab 07/07/23 1436 07/08/23 0753 07/09/23 0258  INR 1.0 1.0 1.1    Other results: EKG:    Imaging  CT CHEST ABDOMEN PELVIS WO CONTRAST  Result Date: 07/07/2023 CLINICAL DATA:  Sepsis.  Concern for drive line infection. EXAM: CT CHEST, ABDOMEN AND PELVIS WITHOUT CONTRAST TECHNIQUE: Multidetector CT imaging of the chest, abdomen and pelvis was performed following the standard protocol without IV contrast. RADIATION DOSE REDUCTION: This exam was performed according to the departmental dose-optimization program which includes automated exposure control, adjustment of the mA and/or kV according to patient size and/or use of iterative reconstruction technique. COMPARISON:  CT dated 01/27/2023.  FINDINGS: Evaluation of this exam is limited in the absence of intravenous contrast. CT CHEST FINDINGS Cardiovascular: There is no cardiomegaly or pericardial effusion. Three-vessel coronary vascular calcification. Left ventricular assist device with associated streak artifact limiting evaluation of the heart and device. There is similar appearance of mild circumferential thickening of the fat plane surrounding the drive line in the upper abdomen extending from the pump to the skin which may be chronic or represent an infectious process. Clinical correlation is recommended. No drainable fluid collection. There is moderate atherosclerotic calcification of the thoracic aorta. No aneurysmal dilatation. The central pulmonary arteries are grossly unremarkable. Mediastinum/Nodes: No hilar or mediastinal adenopathy. The esophagus and the thyroid gland are grossly unremarkable. No mediastinal fluid collection. Lungs/Pleura: Background of emphysema. There are bibasilar linear atelectasis/scarring. No focal consolidation, pleural effusion, or pneumothorax. The central airways are patent. Musculoskeletal: Degenerative changes of the spine. Median sternotomy wires. No acute osseous pathology. CT ABDOMEN PELVIS FINDINGS No intra-abdominal free air or free fluid. Hepatobiliary: The liver is unremarkable. No biliary dilatation. The gallbladder is unremarkable. Pancreas: Unremarkable. No pancreatic ductal dilatation or surrounding inflammatory changes. Spleen: Normal in size without focal abnormality. Adrenals/Urinary Tract: The adrenal glands are unremarkable. There is a 2 mm nonobstructing left renal upper pole calculus. No hydronephrosis or obstructing stone. There is no hydronephrosis or nephrolithiasis on the right. The visualized ureters and urinary bladder appear unremarkable. Stomach/Bowel: There is moderate stool throughout the colon. There is sigmoid diverticulosis without active inflammatory changes. There is no bowel  obstruction or active inflammation. The appendix is normal. Vascular/Lymphatic: Advanced aortoiliac atherosclerotic disease. The IVC is unremarkable. No portal venous gas. There is no adenopathy. Reproductive: The prostate and seminal vesicles are grossly unremarkable. No pelvic mass. Other: There is diastasis of anterior abdominal wall musculature with a broad-based hernia containing loops of small bowel. The neck of the hernia defect measures approximately 16 cm in axial diameter. Musculoskeletal: Osteopenia with degenerative changes of the spine. No acute osseous pathology. Lower lumbar posterior fusion. IMPRESSION: 1. Similar appearance of mild circumferential thickening of the fat plane surrounding the drive line in the upper abdomen extending from the pump to the skin which may be chronic or represent an infectious process. No drainable fluid collection. 2. No acute intrathoracic, abdominal, or pelvic pathology. 3. Sigmoid diverticulosis. No bowel obstruction. Normal appendix. 4. A 2 mm nonobstructing left renal upper pole calculus. No hydronephrosis or obstructing stone. 5. Aortic Atherosclerosis (ICD10-I70.0) and Emphysema (ICD10-J43.9). Electronically Signed   By: Elgie Collard M.D.   On: 07/07/2023 22:19   DG Abd 1 View  Result Date: 07/07/2023 CLINICAL DATA:  Checking left ventricular assist device wires. EXAM: ABDOMEN - 1 VIEW COMPARISON:  KUB 02/04/2023. FINDINGS: The left ventricular assist device is partially imaged. The associated lead is intact, with no fracture or kink. There is nonobstructive bowel gas pattern. Postsurgical changes are noted in the lower lumbar spine. There is no acute osseous abnormality. IMPRESSION: Intact LVAD wires. Electronically Signed   By:  Lesia Hausen M.D.   On: 07/07/2023 14:46     Medications:     Scheduled Medications:  allopurinol  100 mg Oral Daily   clonazepam  0.25 mg Oral BID   feeding supplement  237 mL Oral TID WC   fluticasone  furoate-vilanterol  1 puff Inhalation Daily   And   umeclidinium bromide  1 puff Inhalation Daily   metoprolol succinate  25 mg Oral Daily   mirtazapine  15 mg Oral QHS   pantoprazole  40 mg Oral Daily   sertraline  50 mg Oral Daily   tamsulosin  0.4 mg Oral Daily   thiamine  100 mg Oral Daily   Warfarin - Pharmacist Dosing Inpatient   Does not apply q1600   zinc sulfate  220 mg Oral Daily    Infusions:  ampicillin-sulbactam (UNASYN) IV 3 g (07/09/23 0816)    ceFAZolin (ANCEF) IV     DAPTOmycin (CUBICIN) 800 mg in sodium chloride 0.9 % IVPB 800 mg (07/08/23 1519)   heparin 500 Units/hr (07/08/23 1800)    PRN Medications: acetaminophen, fluticasone   Patient Profile   62 y.o. 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 type 2 diabetes mellitus, COPD, and hypertension. Now s/p Heartmate 3 LVAD. Post implant course c/b repeated DL infections. Now admitted w/ suspected DL fracture and DL infection. P/w "driveline communication fault" error message on LVAD. No pump stoppages. Also w/ significant greenish drainage from DL, in setting of poor compliance w/ suppressive minocycline. Recent fall ~2 wks prior, likely cause of DL trauma.    Assessment/Plan:    1.  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  Cultures have grown acinetobacter and MRSA. He was readmitted with driveline infection in 2/24, completed course of daptomycin for MRSA. DL site with new green/yellow drainage. Now readmitted w/ suspected DL fracture and DL infection. P/w "driveline communication fault" error message on LVAD. No pump stoppages. Also w/ significant greenish drainage from DL, in setting of poor compliance w/ suppressive minocycline. Recent fall ~2 wks prior,  likely cause of DL trauma. LVAD coordinator replaced modular cable and controller with resolution of driveline communication fault signal. CT C/A/P c/w prior imaging w/ similar appearance of mild circumferential thickening of the fat plane surrounding the drive line in the upper abdomen extending from the pump to the skin which may be chronic or represent an infectious process. No drainable fluid collection. Wound + BCx collected. Started on empiric daptomycin/Unasyn per ID.  - remains on abx. No fever/chills. WBC WNL  - Wound Cx growing Pseudomonas Aeruginosa. Susceptibilities pending. Bcx NGTD. - Going to OR today for wound debridement and application of wound vac. - ID team following    2. Suspected DL fracture with multiple recent falls - Has had multiple falls during the last 2 months, one direct trauma to mid sternum (fell on cinder block) - CT chest/abd/pelvis per above  - CXR w/ LVAD partially imaged. The associated lead noted to be intact w/ no fracture or kink  - plan to send imaging to [facility] - LVAD coordinators to changed modular cable and controller - Not candidate for VAD exchange with recurrent drug abuse  3.  Chronic systolic CHF: Echo 10/21 with EF 20-25%, mildly decreased RV function. LHC/RHC in 12/21  with patent grafts, low output. Suspect mixed ischemic/nonischemic cardiomyopathy (prior heavy ETOH and drugs as well as CAD). Admitted with cardiogenic shock in 12/21, had placement of Impella 5.5 initially, now s/p Heartmate 3 LVAD on 11/17/20.  Ramp echo 2/23 with speed decreased to 5500 rpm. Speed now back to 5600 rpm.   - He is not volume overloaded on exam.      - Continue current Toprol XL 25 mg daily.  - He does not need a diuretic.   - INR 1.1 today. INR goal 2-2.5. Covering w/ heparin gtt ahead of OR. Holding coumadin.    4. CKD stage 3: Prior CTA abdomen/pelvis with high-grade stenosis of proximal right renal artery.  - Would consider eventual reassessment of renal  artery stenosis and possible treatment  - SCr stable   5.  CAD: S/p CABG 12/20.  LHC pre-VAD with patent grafts, no target for intervention. - Continue Crestor 20 mg daily.   6.  Atrial fibrillation: Paroxysmal.  S/p Maze and LA appendage clip with CABG in 12/20. Maintaining SR.  - Continue Toprol XL 25 mg daily.  - Off amiodarone with hyperthyroidism.  7. Type 2 diabetes: check A1c, last 8.2 6/23  - hyperglycemic, will start on SSI   8. 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 He says last use of methamphetamine was 1 month ago.  - check urine drug screen  9. 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  Followed by endocrinology. He is now off amiodarone. He had been on methimazole but has not been taking recently. TSH was high on admit and free T4 normal  - Will NOT restart methimazole   10. Anemia (chronic blood loss):  Hgb 11.6. No gross bleeding.  - follow CBP   11. COPD: Prior smoker.  - Continue Trelegy.   12. PVCs/NSVT:  - Now off amiodarone.   13. Depression: Has turned to substance abuse to cope.   - Has been referred to OP psych in [place]  - Consult TOC while IP to assist with substance abuse   14.  Gout: Continue allopurinol, he has prn colchicine.   15. HTN: MAPs elevated. In setting of known Rt RAS.  - monitor closely, if remains elevated may need reassessment of renal artery stenosis and possible treatment - may need PRN hydralazine   I reviewed the LVAD parameters from today, and compared the results to the patient's prior recorded data.  No programming changes were made.  The LVAD is functioning within specified parameters.  The patient performs LVAD self-test daily.  LVAD interrogation was negative for any significant power changes, alarms or PI events/speed drops.  LVAD equipment check completed and is in good working order.  Back-up equipment present.   LVAD education done on emergency procedures and precautions and reviewed exit  site care.  Length of Stay: 2  Robbie Lis, PA-C 07/09/2023, 8:24 AM  VAD Team --- VAD ISSUES ONLY--- Pager [phone] (7am - 7am)  Advanced Heart Failure Team  Pager [phone] (M-F; 7a - 5p)  Please contact [facility] for night-coverage after hours (5p -7a ) and weekends on [email or web address]

## 2023-07-09 NOTE — Brief Op Note (Signed)
07/09/2023  12:26 PM  PATIENT:  Kevin Underwood.  62 y.o. male  PRE-OPERATIVE DIAGNOSIS:  DRIVELIVE TUNNEL INFECTION  POST-OPERATIVE DIAGNOSIS:  DRIVELIVE TUNNEL INFECTION  PROCEDURE:  Procedure(s): DRIVELINE WOUND DEBRIDEMENT (N/A) APPLICATION OF WOUND VAC (N/A)  SURGEON:  Surgeons and Role:    Lovett Sox, MD - Primary  PHYSICIAN ASSISTANT:   ASSISTANTS: none   ANESTHESIA:   general  EBL 5 ml   BLOOD ADMINISTERED:none  DRAINS: none   LOCAL MEDICATIONS USED:  NONE  SPECIMEN:  Scraping  DISPOSITION OF SPECIMEN:   microbiology  COUNTS:  YES  TOURNIQUET:  * No tourniquets in log *  DICTATION: .Dragon Dictation  PLAN OF CARE:  return to 2 C  PATIENT DISPOSITION:  PACU - hemodynamically stable.   Delay start of Pharmacological VTE agent (>24hrs) due to surgical blood loss or risk of bleeding: yes Hold heparin until 7pm then start at 500 u/hr until am rounds 07-10-23

## 2023-07-09 NOTE — Op Note (Signed)
NAME: Kevin Underwood, SOLLARS MEDICAL RECORD NO: [id number] ACCOUNT NO: [id number] DATE OF BIRTH: 04-Mar-1961 FACILITY: [facility] LOCATION: [facility] PHYSICIAN: Kerin Perna III, MD  Operative Report   DATE OF PROCEDURE: 07/09/2023  OPERATION: 1.  Debridement of VAD tunnel wound with excisional debridement of subcutaneous fat and fascia. 2.  Wound irrigation with pulse lavage using Vashe solution. 3.  Placement of wound VAC VeraFlo with instillation of Vashe wound solution.  SURGEON:  Kerin Perna, M.D.  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  ANESTHESIA:  General.  DESCRIPTION OF PROCEDURE:  The patient was brought from his hospital room to preoperative holding.  I reviewed the procedure in detail with the patient and speaking with him face-to-face and informed consent was obtained.  He understood the risks of  bleeding and persistent infection.  The patient was brought back to the operating room by the VAD coordinator and anesthesia.  The VAD coordinator was with the patient throughout the entire procedure to monitor the VAD equipment and to help with hemodynamic management.  The patient was placed supine on the OR table and general anesthesia was induced and the patient intubated.  The patient was carefully monitored and some Neo-Synephrine IV was needed to maintain adequate mean pressure, using low dose. The previous  abdominal dressing was removed and the abdomen and lower chest were prepped and draped as a sterile field.  A proper timeout was performed.  Cultures were taken from the wound with deep culture swabs.  A tunnel extending approximately 3 cm superiorly was  opened with a small incision in the skin, carried down into the subcutaneous tissue and posterior rectus sheath.  Indurated chronically inflamed/infected tissue was identified and carefully excised.  This  tissue included subcutaneous tissue, skin and  fascia.  Hemostasis was achieved.  Some of the lining of the VAD power cord had loosened and that the lower lining was carefully excised to remove biofilm.  I irrigated the wound with the pulse lavage using 1 liter of Vashe solution.  Hemostasis was obtained and the wound VAC sponges were placed in the configuration of the VeraFlo so that Vashe wound solution to be instilled into the top sponge, inside sponge, was used for suction.  After this, a wound VAC configuration was set up, a  seal check was favorable and the wound VAC system was initiated with good compression of the sponges.  The patient was extubated and returned back to the recovery room.  The VAD coordinator accompanied the patient back to the recovery room for monitoring  and operation of the VAD equipment.   SHY D: 07/09/2023 12:43:55 pm T: 07/09/2023 12:56:00 pm  JOB: [id number]/ [id number]

## 2023-07-09 NOTE — Anesthesia Procedure Notes (Signed)
Procedure Name: Intubation Date/Time: 07/09/2023 11:21 AM  Performed by: Alease Medina, CRNAPre-anesthesia Checklist: Patient identified, Emergency Drugs available, Suction available and Patient being monitored Patient Re-evaluated:Patient Re-evaluated prior to induction Oxygen Delivery Method: Circle system utilized Preoxygenation: Pre-oxygenation with 100% oxygen Induction Type: IV induction Laryngoscope Size: Mac and 3 Grade View: Grade I Tube type: Oral Tube size: 7.0 mm Number of attempts: 1 Airway Equipment and Method: Stylet and Oral airway Placement Confirmation: ETT inserted through vocal cords under direct vision, positive ETCO2 and breath sounds checked- equal and bilateral Secured at: 22 cm Tube secured with: Tape Dental Injury: Teeth and Oropharynx as per pre-operative assessment

## 2023-07-09 NOTE — Progress Notes (Signed)
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 Started on IV antibiotics.   Patient presented to VAD clinic 7/29 for driveline communication fault- stating that he has had "alarms every couple hrs from his VAD for a couple months." Pt has no showed and cancelled all appointments in the past 2 months. Pt also had green/blue drainage visible through his driveline bandage. (Dressing change and wound cx obtained). Pt states that he has experienced dizziness associated with a fall multiple times over the past couple months but no signs of bleeding. Controller and modular cable exchanged. No further communication fault alarms noted.   CT Chest/Abd/Pelvis 7/29 1. Similar appearance of mild circumferential thickening of the fat plane surrounding the drive line in the upper abdomen extending from the pump to the skin which may be chronic or represent an infectious process. No drainable fluid collection.  Plan for drive line debridement in OR on 07/09/23 with Dr Donata Clay.   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 Currently on IV Unasyn 3 g Q 6 hrs and IV Daptomycin 800 mg daily per ID team.   Pt laying in bed. Denies complaints. Discussed plan for OR today. He verbalized understanding.   Vital signs: Temp: 98.2 HR: 93 Doppler Pressure: 110 Automatic BP: 131/103 (114) O2 Sat: 97% on RA Wt: 193.3>194.8 lbs  LVAD interrogation reveals:  Speed: 5600 Flow: 3.6 Power: 4.5 w PI: 7.3 Alarms: none Events: 20 PI events so far today Hct: 30  Fixed speed:  5600 Low speed limit:  5300  Drive Line: Existing VAD dressing clean, dry, and intact.  Plan for dressing change in today in OR by Dr Donata Clay. Plan for wound vac placement.    Labs:  LDH trend: 121>133  INR trend: 1.0>1.1  Anticoagulation Plan: -INR Goal: 2.0 - 2.5 -ASA Dose: none  ICD: N/A  Infection:   07/07/23>> blood cultures>> no growth 2 days; final pending 07/07/23>> wound cx>> few pseudomonas aeruginosa; final pending  Drips:  Heparin 500 units/hr  Plan/Recommendations:  Page VAD coordinator with equipment issues or driveline problems Daily drive line wet to dry dressing changes with VASHE solution.   Alyce Pagan RN VAD Coordinator  Office: [phone]  24/7 Pager: [phone]

## 2023-07-09 NOTE — Progress Notes (Signed)
ANTICOAGULATION CONSULT NOTE -   Pharmacy Consult for warfarin + fixed-dose heparin Indication:  LVAD  Allergies  Allergen Reactions   Vancomycin Nausea And Vomiting and Other (See Comments)    Severe joint pain    Patient Measurements: Height: 5\' 10"  (177.8 cm) Weight: 88 kg (194 lb) IBW/kg (Calculated) : 73 Heparin Dosing Weight: 91.4 kg   Vital Signs: Temp: 98 F (36.7 C) (07/31 1311) Temp Source: Oral (07/31 1311) BP: 156/108 (07/31 1311) Pulse Rate: 87 (07/31 1311)  Labs: Recent Labs    07/07/23 1436 07/08/23 0753 07/09/23 0258  HGB 11.8* 11.9* 11.6*  HCT 35.6* 36.4* 34.6*  PLT 199 189 187  LABPROT 13.6 13.8 14.5  INR 1.0 1.0 1.1  HEPARINUNFRC  --  <0.10* <0.10*  CREATININE 1.25* 0.98 0.99  CKTOTAL  --  39*  --     Estimated Creatinine Clearance: 86.4 mL/min (by C-G formula based on SCr of 0.99 mg/dL).   Medical History: Past Medical History:  Diagnosis Date   "    Arthritis    CAD (coronary artery disease)    a. s/p CABG in 11/2019 with LIMA-LAD, SVG-OM1, SVG-PDA and SVG-D1   CHF (congestive heart failure) (HCC)    a. EF < 20% by echo in 11/2019   Essential hypertension    PAF (paroxysmal atrial fibrillation) (HCC)    Type 2 diabetes mellitus (HCC)     Medications:  Scheduled:   acetaminophen       allopurinol  100 mg Oral Daily   clonazepam  0.25 mg Oral BID   fluticasone furoate-vilanterol  1 puff Inhalation Daily   And   umeclidinium bromide  1 puff Inhalation Daily   insulin aspart       insulin aspart  0-15 Units Subcutaneous TID WC   metoprolol succinate  25 mg Oral Daily   minocycline  200 mg Oral BID   mirtazapine  15 mg Oral QHS   pantoprazole  40 mg Oral Daily   sertraline  50 mg Oral Daily   tamsulosin  0.4 mg Oral Daily   thiamine  100 mg Oral Daily   warfarin  6 mg Oral ONCE-1600   Warfarin - Pharmacist Dosing Inpatient   Does not apply q1600   zinc sulfate  220 mg Oral Daily    Assessment: 62 yom who presented to  clinic with concern for driveline fracture and infection. Last [facility] appointment was on 04/21/2023 - INR was subtherapeutic at 1.4 and regimen at that time was 6 mg daily except 4 mg Tues/Thurs (LD 7/25).  Given low INR on admit, started fixed rate heparin 500uts/hr no up titration with heparin level 0.1 as expected  until INR is therapeutic. No s/sx of bleeding - CT image neg for bleed.  CBC stable, INR today remains 1.  LDH stable 121 S/p debridement of DLI infection 7/31 plan for repeat next week   Goal of Therapy:  Heparin level <0.3 units/ml INR 2-2.5 Monitor platelets by anticoagulation protocol: Yes   Plan:   warfarin 6 mg again tonight with plan to keep INR < 1,5 while debriding  Continue heparin infusion at 500 units/hr >> (no up titrations unless discussed with MD) Monitor daily heparin level, INR, CBC, LDH, and for s/sx of bleeding    Leota Sauers Pharm.D. CPP, BCPS Clinical Pharmacist [phone] 07/09/2023 1:37 PM   [facility] pharmacy phone numbers are listed on [email or web address]

## 2023-07-09 NOTE — Progress Notes (Signed)
Pre Procedure note for inpatients:   Kevin Underwood. has been scheduled for Procedure(s): DRIVELINE WOUND DEBRIDEMENT (N/A) APPLICATION OF WOUND VAC (N/A) today. The various methods of treatment have been discussed with the patient. After consideration of the risks, benefits and treatment options the patient has consented to the planned procedure.   The patient has been seen and labs reviewed. There are no changes in the patient's condition to prevent proceeding with the planned procedure today.  Recent labs:  Lab Results  Component Value Date   WBC 6.8 07/09/2023   HGB 11.6 (L) 07/09/2023   HCT 34.6 (L) 07/09/2023   PLT 187 07/09/2023   GLUCOSE 201 (H) 07/09/2023   CHOL 142 09/11/2021   TRIG 95 09/11/2021   HDL 62 09/11/2021   LDLCALC 61 09/11/2021   ALT 24 07/07/2023   AST 17 07/07/2023   NA 135 07/09/2023   K 3.7 07/09/2023   CL 99 07/09/2023   CREATININE 0.99 07/09/2023   BUN 21 07/09/2023   CO2 24 07/09/2023   TSH 7.452 (H) 07/07/2023   INR 1.1 07/09/2023   HGBA1C 8.2 (H) 05/13/2022    Lovett Sox, MD 07/09/2023 8:23 AM

## 2023-07-09 NOTE — Progress Notes (Signed)
Pharmacy Antibiotic Note  Kevin Underwood. is a 62 y.o. male admitted on 07/07/2023 with new discharge on LVAD wound found to be LVAD driveline infection. Patient has a history of Acinetobacter baumannii and MRSA infections and was on minocycline PTA. Patient was started on daptomycin and Unasyn. 7/29 VAD wound cultures now growing Pseudomonas (pan-sensitive) and S. aureus. Pharmacy has been consulted for cefepime, daptomycin, and minocycline dosing.  Plan: Daptomycin 800mg  (9 mg/kg) IV Q24H Cefepime 2g IV Q8H Minocycline 200mg  PO Q12H   Baseline CPK 39 on 7/30, will check level weekly. Will continue to follow blood/driveline cultures and monitor renal function.   Height: 5\' 10"  (177.8 cm) Weight: 88 kg (194 lb) IBW/kg (Calculated) : 73  Temp (24hrs), Avg:98.2 F (36.8 C), Min:97.9 F (36.6 C), Max:98.9 F (37.2 C)  Recent Labs  Lab 07/07/23 1436 07/08/23 0753 07/09/23 0258  WBC 7.8 7.5 6.8  CREATININE 1.25* 0.98 0.99    Estimated Creatinine Clearance: 86.4 mL/min (by C-G formula based on SCr of 0.99 mg/dL).    Allergies  Allergen Reactions   Vancomycin Nausea And Vomiting and Other (See Comments)    Severe joint pain    Antimicrobials this admission: 7/29 Daptomycin >> 7/29 Unasyn >> 7/31 7/31 Cefepime >> 7/31 Minocycline >>  Microbiology results: 7/29 BCx: NG x 2 days 7/29 MRSA PCR: Negative 7/29 VAD wound culture: Pseudomonas, S. Aureus 7/31 AFB: NGTD    Thank you for allowing pharmacy to be a part of this patient's care.  Lendon Ka, PharmD Candidate 07/09/2023 1:36 PM

## 2023-07-09 NOTE — Plan of Care (Signed)
  Problem: Education: Goal: Patient will understand all VAD equipment and how it functions Outcome: Progressing Goal: Patient will be able to verbalize current INR target range and antiplatelet therapy for discharge home Outcome: Progressing   Problem: Cardiac: Goal: LVAD will function as expected and patient will experience no clinical alarms Outcome: Progressing   Problem: Education: Goal: Knowledge of General Education information will improve Description: Including pain rating scale, medication(s)/side effects and non-pharmacologic comfort measures Outcome: Progressing   Problem: Health Behavior/Discharge Planning: Goal: Ability to manage health-related needs will improve Outcome: Progressing   Problem: Clinical Measurements: Goal: Ability to maintain clinical measurements within normal limits will improve Outcome: Progressing Goal: Will remain free from infection Outcome: Progressing Goal: Diagnostic test results will improve Outcome: Progressing Goal: Respiratory complications will improve Outcome: Progressing Goal: Cardiovascular complication will be avoided Outcome: Progressing   Problem: Activity: Goal: Risk for activity intolerance will decrease Outcome: Progressing   Problem: Nutrition: Goal: Adequate nutrition will be maintained Outcome: Progressing   Problem: Coping: Goal: Level of anxiety will decrease Outcome: Progressing   Problem: Elimination: Goal: Will not experience complications related to bowel motility Outcome: Progressing Goal: Will not experience complications related to urinary retention Outcome: Progressing   Problem: Pain Managment: Goal: General experience of comfort will improve Outcome: Progressing   Problem: Safety: Goal: Ability to remain free from injury will improve Outcome: Progressing   Problem: Skin Integrity: Goal: Risk for impaired skin integrity will decrease Outcome: Progressing   

## 2023-07-09 NOTE — Progress Notes (Addendum)
VAD Coordinator Procedure Note:   VAD Coordinator met patient in OR. Pt undergoing drive line wound debridement and application of wound vac per Dr. Donata Clay. Hemodynamics and VAD parameters monitored by myself and anesthesia throughout the procedure. Blood pressures were obtained with automatic cuff on right arm.     Time: Doppler Auto  BP Flow PI Power Speed  Pre-procedure:  1115  150/115 (127) 3.6 7.2 4.4 5600                    Sedation Induction: 1124  131/103 (112) 2.9 10.0 4.8 5600   1130  69/55 (62) 3.5 8.1 4.1 5600   1140  102/85 (92) 4.1 4.7 4.2 5600   1145  124/94 (104) 4.0 5.2 4.1 5600   1200  106/89(96) 4.4 3.5 4.1 5600   1215  75/60 (67) 4.4 3.0 3.9 5600           Recovery Area: 1230  116/104 (110) 4.0 4.9 4.3 5600   1245  152/101 (114) 3.7 6.5 4.3 5600     Patient Disposition: Patient tolerated the procedure well. VAD Coordinator accompanied and remained with patient in recovery area.   Wound measurements: 4 cm L x 2 cm W x 3 cm D    Veraflo therapy- 10 cc q 2 hrs Negative therapy- -125  Will plan to return to OR next week for washout and vac change.  No heparin until 7 PM then restart Heparin 500 units/hr until morning rounds, then may titrate as needed per Dr Donata Clay.   Leak alarm noted on arrival to PACU. Reinforced dressing with clear tegaderm with resolution of leak alarm. Abdominal binder in place per Dr Donata Clay.   Alyce Pagan RN VAD Coordinator  Office: [phone]  24/7 Pager: [phone]

## 2023-07-09 NOTE — Anesthesia Postprocedure Evaluation (Signed)
Anesthesia Post Note  Patient: Kevin Underwood.  Procedure(s) Performed: DRIVELINE WOUND DEBRIDEMENT APPLICATION OF WOUND VAC     Patient location during evaluation: PACU Anesthesia Type: General Level of consciousness: sedated Pain management: pain level controlled Vital Signs Assessment: post-procedure vital signs reviewed and stable Respiratory status: spontaneous breathing and respiratory function stable Cardiovascular status: stable Postop Assessment: no apparent nausea or vomiting Anesthetic complications: no   No notable events documented.  Last Vitals:  Vitals:   07/09/23 1300 07/09/23 1311  BP: (!) 148/111 (!) 156/108  Pulse: 84 87  Resp: 16 17  Temp: 36.6 C 36.7 C  SpO2: 98% 97%    Last Pain:  Vitals:   07/09/23 1311  TempSrc: Oral  PainSc: 0-No pain                 Lorrain Rivers,Brenson DANIEL

## 2023-07-09 NOTE — Anesthesia Preprocedure Evaluation (Addendum)
Anesthesia Evaluation  Patient identified by MRN, date of birth, ID band Patient awake    Reviewed: Allergy & Precautions, NPO status , Patient's Chart, lab work & pertinent test results  History of Anesthesia Complications Negative for: history of anesthetic complications  Airway Mallampati: I  TM Distance: >3 FB Neck ROM: Full    Dental  (+) Edentulous Upper, Edentulous Lower, Dental Advisory Given   Pulmonary sleep apnea , COPD, former smoker   Pulmonary exam normal        Cardiovascular hypertension, + angina  + CAD, + CABG and +CHF  Normal cardiovascular exam  Echo 06/2022  1. LVAD present at LV apex. Speed increased from 5500 to 5700. Septum  mildly shifted to the right at 5500, shifted more leftward at 5700. Speed  left at 5600 rpm at end of study. Left ventricular ejection fraction, by  estimation, is 20 to 25%. The left  ventricle has severely decreased function. The left ventricle demonstrates  global hypokinesis. There is mild left ventricular hypertrophy. Left  ventricular diastolic parameters are indeterminate.   2. Right ventricular systolic function is mildly reduced. The right  ventricular size is moderately enlarged. Tricuspid regurgitation signal is  inadequate for assessing PA pressure.   3. Left atrial size was mildly dilated.   4. Right atrial size was mildly dilated.   5. The mitral valve is normal in structure. Mild mitral valve  regurgitation. No evidence of mitral stenosis.   6. The aortic valve opened every beat at 5500 rpm, every other beat at 5700 rpm. The aortic valve is tricuspid. Aortic valve regurgitation is not visualized. No aortic stenosis is present.   7. The inferior vena cava is normal in size with greater than 50% respiratory variability, suggesting right atrial pressure of 3 mmHg.   8. LVAD ramp study.      Neuro/Psych  PSYCHIATRIC DISORDERS  Depression    negative neurological ROS      GI/Hepatic negative GI ROS, Neg liver ROS,,,  Endo/Other  diabetes Hyperthyroidism   Renal/GU Renal disease     Musculoskeletal  (+) Arthritis ,    Abdominal   Peds  Hematology   Anesthesia Other Findings   Reproductive/Obstetrics                             Anesthesia Physical Anesthesia Plan  ASA: 4  Anesthesia Plan: General   Post-op Pain Management: Ofirmev IV (intra-op)*   Induction: Intravenous  PONV Risk Score and Plan: 2 and Ondansetron, Dexamethasone, Midazolam and Treatment may vary due to age or medical condition  Airway Management Planned: Oral ETT  Additional Equipment:   Intra-op Plan:   Post-operative Plan: Extubation in OR  Informed Consent: I have reviewed the patients History and Physical, chart, labs and discussed the procedure including the risks, benefits and alternatives for the proposed anesthesia with the patient or authorized representative who has indicated his/her understanding and acceptance.   Patient has DNR.  Discussed DNR with patient and Suspend DNR.   Dental advisory given  Plan Discussed with: Anesthesiologist, CRNA and Surgeon  Anesthesia Plan Comments:         Anesthesia Quick Evaluation

## 2023-07-09 NOTE — TOC Initial Note (Signed)
Transition of Care (TOC) - Initial/Assessment Note    Patient Details  Name: Kevin Underwood. MRN: [id number] Date of Birth: 28-Jan-1961  Transition of Care [facility]) CM/SW Contact:    Nicanor Bake Phone Number: [phone] 07/09/2023, 4:09 PM  Clinical Narrative: CSW met with pt at bedside. CSW built rapport with the pt. Pt is a returning LVAD pt. Pt stated that he is currently living at home with his wife and adult son. Pt stated that he still drives himself to and from. Pt stated that he has all of the necessary equipment needed at home. Pt has a scale at home. CSW explained that a follow up hospital appointment will be scheduled closer to discharge. TOC will continue to follow.                   Expected Discharge Plan: Home/Self Care Barriers to Discharge: Continued Medical Work up   Patient Goals and CMS Choice Patient states their goals for this hospitalization and ongoing recovery are:: Get back home          Expected Discharge Plan and Services       Living arrangements for the past 2 months: Single Family Home                                      Prior Living Arrangements/Services Living arrangements for the past 2 months: Single Family Home Lives with:: Spouse, Adult Children Patient language and need for interpreter reviewed:: Yes Do you feel safe going back to the place where you live?: Yes      Need for Family Participation in Patient Care: Yes (Comment) Care giver support system in place?: No (comment)   Criminal Activity/Legal Involvement Pertinent to Current Situation/Hospitalization: No - Comment as needed  Activities of Daily Living Home Assistive Devices/Equipment: None ADL Screening (condition at time of admission) Patient's cognitive ability adequate to safely complete daily activities?: Yes Is the patient deaf or have difficulty hearing?: No Does the patient have difficulty seeing, even when wearing glasses/contacts?: No Does  the patient have difficulty concentrating, remembering, or making decisions?: No Patient able to express need for assistance with ADLs?: Yes Does the patient have difficulty dressing or bathing?: No Independently performs ADLs?: Yes (appropriate for developmental age) Does the patient have difficulty walking or climbing stairs?: No Weakness of Legs: None Weakness of Arms/Hands: None  Permission Sought/Granted                  Emotional Assessment Appearance:: Appears stated age Attitude/Demeanor/Rapport: Engaged Affect (typically observed): Appropriate Orientation: : Oriented to Self, Oriented to Place, Oriented to  Time, Oriented to Situation Alcohol / Substance Use: Never Used Psych Involvement: No (comment)  Admission diagnosis:  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 (LVAD) (HCC) [Q65.7XXA] Patient Active Problem List   Diagnosis Date Noted   Stimulant use disorder 02/09/2023   Depression 02/09/2023   Pain and swelling of right wrist 09/30/2022   Hypotension 08/07/2022   MRSA infection    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 (LVAD) (HCC)    Left ventricular assist device (LVAD) complication, subsequent encounter 05/13/2022   AKI (acute kidney injury) (HCC) 05/13/2022   Hyperthyroidism 01/22/2022   Chronic systolic heart failure (HCC)    COPD (chronic obstructive pulmonary disease) (HCC) 09/09/2021   Malnutrition of moderate degree 11/17/2020   CHF (congestive heart  failure), NYHA class IV, chronic, systolic (HCC) 11/17/2020   Palliative care by specialist    Goals of care, counseling/discussion    Hyperlipidemia    CHF exacerbation (HCC) 09/23/2020   Unstable angina (HCC) 09/22/2020   LVAD (left ventricular assist device) present (HCC)    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   Cardiogenic shock (HCC)    Acute respiratory failure (HCC)    RUQ abdominal pain    Cardiomyopathy (HCC) 11/21/2019   Acute systolic CHF  (congestive heart failure) (HCC) 11/21/2019   History of alcohol abuse Quit 6 mos ago 11/21/2019   History of recreational drug use 11/21/2019   PAF (paroxysmal atrial fibrillation) (HCC) 11/21/2019   Cardiac volume overload 11/21/2019   Elevated brain natriuretic peptide (BNP) level 11/21/2019   Leg edema, right 11/21/2019   Hyponatremia 11/21/2019   Abnormal electrocardiogram (ECG) (EKG) 11/21/2019   Hyperglycemia 11/21/2019   Tremulousness 11/21/2019   Restlessness and agitation 11/21/2019   OSA (obstructive sleep apnea) 11/21/2019   Atrial fibrillation with RVR (HCC) 11/20/2019   Acute congestive heart failure (HCC) 11/20/2019   Acute respiratory distress 11/20/2019   COPD with acute exacerbation (HCC)    Hypertension    Type 2 diabetes mellitus (HCC)    Closed fracture of 5th metacarpal 08/31/2012   PCP:  [name], FNP Pharmacy:   [facility] [phone] - [place], [place] - [address] [facility] [address] [place] [address] Phone: [phone] Fax: [phone]     Social Determinants of Health (SDOH) Social History: SDOH Screenings   Food Insecurity: No Food Insecurity (07/08/2023)  Housing: Low Risk  (07/08/2023)  Transportation Needs: No Transportation Needs (07/08/2023)  Utilities: Not At Risk (07/08/2023)  Alcohol Screen: Low Risk  (11/10/2020)  Depression (PHQ2-9): Low Risk  (10/16/2022)  Financial Resource Strain: Medium Risk (05/17/2022)  Physical Activity: Inactive (11/10/2020)  Tobacco Use: Medium Risk (07/09/2023)   SDOH Interventions:     Readmission Risk Interventions    07/01/2022    9:05 AM 11/28/2020   11:57 AM  Readmission Risk Prevention Plan  Transportation Screening Complete Complete  Medication Review (RN Care Manager) Complete Complete  PCP or Specialist appointment within 3-5 days of discharge Complete   HRI or Home Care Consult Complete Complete  SW Recovery Care/Counseling Consult Complete Complete   Palliative Care Screening Not Applicable Not Applicable  Skilled Nursing Facility Not Applicable Not Applicable

## 2023-07-10 ENCOUNTER — Encounter (HOSPITAL_COMMUNITY): Payer: Self-pay | Admitting: Cardiothoracic Surgery

## 2023-07-10 DIAGNOSIS — T827XXA Infection and inflammatory reaction due to other cardiac and vascular devices, implants and grafts, initial encounter: Secondary | ICD-10-CM | POA: Diagnosis not present

## 2023-07-10 LAB — GLUCOSE, CAPILLARY
Glucose-Capillary: 242 mg/dL — ABNORMAL HIGH (ref 70–99)
Glucose-Capillary: 235 mg/dL — ABNORMAL HIGH (ref 70–99)
Glucose-Capillary: 314 mg/dL — ABNORMAL HIGH (ref 70–99)
Glucose-Capillary: 173 mg/dL — ABNORMAL HIGH (ref 70–99)

## 2023-07-10 MED ORDER — WARFARIN SODIUM 5 MG PO TABS
5.0000 mg | ORAL_TABLET | Freq: Every day | ORAL | Status: DC
  Administered 2023-07-10: 5 mg via ORAL
  Filled 2023-07-10: qty 1

## 2023-07-10 MED ORDER — INSULIN ASPART 100 UNIT/ML IJ SOLN
0.0000 [IU] | Freq: Every day | INTRAMUSCULAR | Status: DC
  Administered 2023-07-11: 3 [IU] via SUBCUTANEOUS
  Administered 2023-07-12: 2 [IU] via SUBCUTANEOUS
  Administered 2023-07-13: 3 [IU] via SUBCUTANEOUS
  Administered 2023-07-14 – 2023-07-15 (×2): 2 [IU] via SUBCUTANEOUS
  Administered 2023-07-16: 5 [IU] via SUBCUTANEOUS
  Administered 2023-07-17: 2 [IU] via SUBCUTANEOUS

## 2023-07-10 NOTE — Progress Notes (Signed)
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 Started on IV antibiotics.   Patient presented to VAD clinic 7/29 for driveline communication fault- stating that he has had "alarms every couple hrs from his VAD for a couple months." Pt has no showed and cancelled all appointments in the past 2 months. Pt also had green/blue drainage visible through his driveline bandage. (Dressing change and wound cx obtained). Pt states that he has experienced dizziness associated with a fall multiple times over the past couple months but no signs of bleeding. Controller and modular cable exchanged. No further communication fault alarms noted.   CT Chest/Abd/Pelvis 7/29 1. Similar appearance of mild circumferential thickening of the fat plane surrounding the drive line in the upper abdomen extending from the pump to the skin which may be chronic or represent an infectious process. No drainable fluid collection.  Pt had drive line debridement in OR with placement of instillation wound vac on 07/09/23 with Dr Donata Clay.   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 Currently on IV Unasyn 3 g Q 6 hrs and IV Daptomycin 800 mg daily per ID team. Increase in WBC and low grade temperature overnight. Blood cultures obtained last afternoon.  Pt laying in bed. Denies complaints. Pt states he took his abdominal binder off last night for comfort. Therapy inactive alarm noted on wound vac. VAD Coordinator restarted VAC therapy with good seal. Bedside RN notified if alarm reoccurs to restart wound vac and reassess seal due to large hernia present at wound vac site.  Vital signs: Temp: 98.2 HR: 91 Doppler Pressure: 108 Automatic BP: 120/90 (100) O2 Sat: 97% on RA Wt: 193.3>194.8>196.4 lbs  LVAD interrogation reveals:  Speed: 5600 Flow: 4.1 Power: 5.0 w PI: 4.2 Alarms: none Events: 30-50 PI  events Hct: 30  Fixed speed:  5600 Low speed limit:  5300  Drive Line: Wound vac dressing clean, dry and intact. Therapy inactive alarm present. This VAD Coordinator restarted therapy and assessed for leak. Good seal obtained. Suction -125 mmHg. VASHE instillation volume 10mL q2 hrs for 10 minutes dwell. Sponge "bridge" intact over draping. Secured "bridge" with additional draping. Plan for drive line debridement and wound vac change next week in OR with Dr Donata Clay on Wednesday 07/16/23.   Labs:  LDH trend: 121>133>130  INR trend: 1.0>1.1>1.2  Anticoagulation Plan: -INR Goal: 2.0 - 2.5 -ASA Dose: none  ICD: N/A  Infection:  07/07/23>> blood cultures>> no growth 2 days; final pending 07/07/23>> wound cx>> few pseudomonas aeruginosa; final pending 07/09/23>> OR driveline cx>> Few pseudomonas aeruginosa  >> pending 07/09/23>> blood cultures>>pending  Drips:  Heparin 500 units/hr  Plan/Recommendations:  Page VAD coordinator with equipment issues or driveline problems Daily drive line wet to dry dressing changes with VASHE solution.   Simmie Davies RN,BSN VAD Coordinator  Office: [phone]  24/7 Pager: [phone]

## 2023-07-10 NOTE — Progress Notes (Signed)
Wound Vac alarming, leak detected.  RN able to seal leak intermittently, but alarm occasionally going off.  Doppler 112.  LVAD numbers wnl, see flowsheet.  Zollie Scale, LVAD coordinator notified of findings.  No new orders at this time.

## 2023-07-10 NOTE — Progress Notes (Signed)
Advanced Heart Failure VAD Team Note  PCP-Cardiologist: Nona Dell, MD  [facility]: Dr. Shirlee Latch   Subjective:    7/29: admitted w/ suspected DL fracture and DL infection. P/w "driveline communication fault" error message on LVAD. No pump stoppages. LVAD coordinator replaced modular cable and controller with resolution of driveline communication fault signal. CT C/A/P c/w prior imaging w/ similar appearance of mild circumferential thickening of the fat plane surrounding the drive line in the upper abdomen extending from the pump to the skin which may be chronic or represent an infectious process. No drainable fluid collection. Wound + BCx collected. Started on empiric daptomycin/Unasyn per ID.  7/30: Wound Cx growing Pseudomonas Aeruginosa (pan-sensitive) and S. aureus. Cefepime added 7/31: OR for wound debridement + application of wound vac.    Remains on Dapto, Cefepime and minocycline. Surgical wound cx pending. Bcx from 7/29 NGTD. Now w/ low grade fever, Temp 99.3.  WBC bump 6.8>>11.2K in 24 hr but no systemic symptoms. Denies subjective fever/chills. ID team following   On low dose heparin. INR 1.2  MAP low 100s    LVAD INTERROGATION:  HeartMate III LVAD:   Flow 4.2 liters/min, speed 5600, power 4.1, PI 5.5.  Numerous PI events   Objective:    Vital Signs:   Temp:  [97.9 F (36.6 C)-99.3 F (37.4 C)] 99.3 F (37.4 C) (08/01 0319) Pulse Rate:  [81-98] 98 (07/31 2324) Resp:  [13-17] 17 (08/01 0319) BP: (110-156)/(81-111) 117/91 (08/01 0319) SpO2:  [90 %-99 %] 95 % (08/01 0319) Weight:  [88 kg-89.1 kg] 89.1 kg (08/01 0327) Last BM Date : 07/09/23 Mean arterial Pressure low 100s   Intake/Output:   Intake/Output Summary (Last 24 hours) at 07/10/2023 0742 Last data filed at 07/10/2023 0600 Gross per 24 hour  Intake 2080.37 ml  Output 2985 ml  Net -904.63 ml     Physical Exam    General:  well appearing. No distress  HEENT: normal Neck: supple. JVD 6 cm Carotids 2+ bilat;  no bruits. No lymphadenopathy or thyromegaly appreciated. Cor: Mechanical heart sounds with LVAD hum present. Lungs: clear bilaterally  Abdomen: soft, nontender, nondistended. No hepatosplenomegaly. No bruits or masses. Good bowel sounds. Large ventral hernia, Soft and easy to reduce + wound vac w/ good seal  Driveline: C/D/I; securement device intact and driveline incorporated Extremities: no cyanosis, clubbing, rash, no edema Neuro: alert & orientedx3, cranial nerves grossly intact. moves all 4 extremities w/o difficulty. Affect pleasant   Telemetry   NSR 90s-sinus tach low 100s   EKG    N/A   Labs   Basic Metabolic Panel: Recent Labs  Lab 07/07/23 1436 07/08/23 0753 07/09/23 0258 07/10/23 0306  NA 136 138 135 131*  K 3.7 4.2 3.7 3.7  CL 103 101 99 98  CO2 23 24 24  21*  GLUCOSE 200* 207* 201* 305*  BUN 24* 19 21 22   CREATININE 1.25* 0.98 0.99 1.20  CALCIUM 8.6* 9.0 8.8* 8.8*  MG 1.6*  --   --   --     Liver Function Tests: Recent Labs  Lab 07/07/23 1436  AST 17  ALT 24  ALKPHOS 93  BILITOT 0.3  PROT 6.0*  ALBUMIN 3.0*   No results for input(s): "LIPASE", "AMYLASE" in the last 168 hours. No results for input(s): "AMMONIA" in the last 168 hours.  CBC: Recent Labs  Lab 07/07/23 1436 07/08/23 0753 07/09/23 0258 07/10/23 0306  WBC 7.8 7.5 6.8 11.2*  HGB 11.8* 11.9* 11.6* 11.0*  HCT 35.6* 36.4*  34.6* 32.5*  MCV 85.4 83.5 82.2 83.5  PLT 199 189 187 215    INR: Recent Labs  Lab 07/07/23 1436 07/08/23 0753 07/09/23 0258 07/10/23 0306  INR 1.0 1.0 1.1 1.2    Other results: EKG:    Imaging   No results found.   Medications:     Scheduled Medications:  allopurinol  100 mg Oral Daily   clonazepam  0.25 mg Oral BID   fluticasone furoate-vilanterol  1 puff Inhalation Daily   And   umeclidinium bromide  1 puff Inhalation Daily   insulin aspart  0-15 Units Subcutaneous TID WC   metoprolol succinate  25 mg Oral Daily   minocycline  200 mg  Oral BID   mirtazapine  15 mg Oral QHS   pantoprazole  40 mg Oral Daily   sertraline  50 mg Oral Daily   tamsulosin  0.4 mg Oral Daily   thiamine  100 mg Oral Daily   Warfarin - Pharmacist Dosing Inpatient   Does not apply q1600   zinc sulfate  220 mg Oral Daily    Infusions:  ceFEPime (MAXIPIME) IV 2 g (07/10/23 0612)   DAPTOmycin (CUBICIN) 800 mg in sodium chloride 0.9 % IVPB 800 mg (07/09/23 2243)   heparin 500 Units/hr (07/09/23 1844)    PRN Medications: acetaminophen, fluticasone   Patient Profile   62 y.o. 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 type 2 diabetes mellitus, COPD, and hypertension. Now s/p Heartmate 3 LVAD. Post implant course c/b repeated DL infections. Now admitted w/ suspected DL fracture and DL infection. P/w "driveline communication fault" error message on LVAD. No pump stoppages. Also w/ significant greenish drainage from DL, in setting of poor compliance w/ suppressive minocycline. Recent fall ~2 wks prior, likely cause of DL trauma.    Assessment/Plan:    1.  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  Cultures have grown acinetobacter and MRSA. He was readmitted with driveline infection in 2/24, completed course of daptomycin for MRSA. DL site with new green/yellow drainage. Now readmitted w/ suspected DL fracture and DL infection. P/w "driveline communication fault" error message on LVAD. No pump stoppages. Also w/ significant greenish drainage from DL, in setting of poor compliance w/ suppressive minocycline. Recent fall ~2 wks prior, likely cause of DL trauma. LVAD coordinator replaced modular cable and controller with resolution of driveline communication fault signal. CT C/A/P c/w prior imaging w/  similar appearance of mild circumferential thickening of the fat plane surrounding the drive line in the upper abdomen extending from the pump to the skin which may be chronic or represent an infectious process. No drainable fluid collection. Wound + BCx collected. Started on empiric daptomycin/Unasyn per ID. Initial wound Cx + for Pseudomonas Aeruginosa (pan-sensitive) and S. aureus. Cefepime added. Now on Dapto, Cefepime and minocycline. BCx NGTD. Taken to OR 7/31 for wound debridement + application of wound vac - continue abx per ID. Appreciate their assistance. Watch WBCs and fever curve  - Will plan to return to OR next week for washout and vac change.    2. Suspected DL fracture with multiple recent falls - Has had multiple falls during the last 2 months, one direct trauma to mid sternum (fell on cinder block) - CT chest/abd/pelvis per above  -  CXR w/ LVAD partially imaged. The associated lead noted to be intact w/ no fracture or kink  - plan to send imaging to [facility] - LVAD coordinators to changed modular cable and controller - Not candidate for VAD exchange with recurrent drug abuse  3.  Chronic systolic CHF: Echo 10/21 with EF 20-25%, mildly decreased RV function. LHC/RHC in 12/21 with patent grafts, low output. Suspect mixed ischemic/nonischemic cardiomyopathy (prior heavy ETOH and drugs as well as CAD). Admitted with cardiogenic shock in 12/21, had placement of Impella 5.5 initially, now s/p Heartmate 3 LVAD on 11/17/20.  Ramp echo 2/23 with speed decreased to 5500 rpm. Speed now back to 5600 rpm.   - He is not volume overloaded on exam.      - Continue current Toprol XL 25 mg daily.  - He does not need a diuretic.   - INR 1.2 today. INR goal 2-2.5. Continue heparin until completion of planned procedures    4. CKD stage 3: Prior CTA abdomen/pelvis with high-grade stenosis of proximal right renal artery.  - Would consider eventual reassessment of renal artery stenosis and possible  treatment  - SCr stable   5.  CAD: S/p CABG 12/20.  LHC pre-VAD with patent grafts, no target for intervention. - Continue Crestor 20 mg daily.   6.  Atrial fibrillation: Paroxysmal.  S/p Maze and LA appendage clip with CABG in 12/20. Maintaining SR.  - Continue Toprol XL 25 mg daily.  - Off amiodarone with hyperthyroidism.  7. Type 2 diabetes: improved control, Hgb A1c down from 12.1>>6.3  - continue SSI   8. 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 He says last use of methamphetamine was 1 month ago.  - USD this admit negative (however collected > 48 hrs post admit)   9. 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  Followed by endocrinology. He is now off amiodarone. He had been on methimazole but has not been taking recently. TSH was high on admit and free T4 normal  - Will NOT restart methimazole   10. Anemia (chronic blood loss):  Hgb stable, 11.0. No gross bleeding.  - follow CBP   11. COPD: Prior smoker.  - Continue Trelegy.   12. PVCs/NSVT:  - Now off amiodarone.   13. Depression: Has turned to substance abuse to cope.   - Has been referred to OP psych in [place]  - Consult TOC while IP to assist with substance abuse   14.  Gout: Continue allopurinol, he has prn colchicine.   15. HTN: MAPs elevated. In setting of known Rt RAS.  - monitor closely, if remains elevated may need reassessment of renal artery stenosis and possible treatment - may need PRN hydralazine   I reviewed the LVAD parameters from today, and compared the results to the patient's prior recorded data.  No programming changes were made.  The LVAD is functioning within specified parameters.  The patient performs LVAD self-test daily.  LVAD interrogation was negative for any significant power changes, alarms or PI events/speed drops.  LVAD equipment check completed and is in good working order.  Back-up equipment present.   LVAD education done on emergency procedures and  precautions and reviewed exit site care.  Length of Stay: [address] Delmer Islam 07/10/2023, 7:42 AM  VAD Team --- VAD ISSUES ONLY--- Pager [phone] (7am - 7am)  Advanced Heart Failure Team  Pager [phone] (M-F; 7a - 5p)  Please contact [facility] for night-coverage after hours (5p -7a ) and  weekends on [email or web address]

## 2023-07-10 NOTE — Progress Notes (Signed)
2000- pt requested for the abdominal binder to be taken off as he don't feel comfortable. Advised pt to wear it every time he is up. No wound vac issue since binder is off. Plan ongoing.

## 2023-07-10 NOTE — Progress Notes (Signed)
ANTICOAGULATION CONSULT NOTE -   Pharmacy Consult for warfarin + fixed-dose heparin Indication:  LVAD  Allergies  Allergen Reactions   Vancomycin Nausea And Vomiting and Other (See Comments)    Severe joint pain    Patient Measurements: Height: 5\' 10"  (177.8 cm) Weight: 89.1 kg (196 lb 6.9 oz) IBW/kg (Calculated) : 73 Heparin Dosing Weight: 91.4 kg   Vital Signs: Temp: 98 F (36.7 C) (08/01 1138) Temp Source: Oral (08/01 1138) BP: 129/98 (08/01 1138) Pulse Rate: 71 (08/01 1138)  Labs: Recent Labs    07/08/23 0753 07/09/23 0258 07/10/23 0306  HGB 11.9* 11.6* 11.0*  HCT 36.4* 34.6* 32.5*  PLT 189 187 215  LABPROT 13.8 14.5 15.7*  INR 1.0 1.1 1.2  HEPARINUNFRC <0.10* <0.10* <0.10*  CREATININE 0.98 0.99 1.20  CKTOTAL 39*  --   --     Estimated Creatinine Clearance: 71.7 mL/min (by C-G formula based on SCr of 1.2 mg/dL).   Medical History: Past Medical History:  Diagnosis Date   "    Arthritis    CAD (coronary artery disease)    a. s/p CABG in 11/2019 with LIMA-LAD, SVG-OM1, SVG-PDA and SVG-D1   CHF (congestive heart failure) (HCC)    a. EF < 20% by echo in 11/2019   Essential hypertension    PAF (paroxysmal atrial fibrillation) (HCC)    Type 2 diabetes mellitus (HCC)     Medications:  Scheduled:   allopurinol  100 mg Oral Daily   clonazepam  0.25 mg Oral BID   fluticasone furoate-vilanterol  1 puff Inhalation Daily   And   umeclidinium bromide  1 puff Inhalation Daily   insulin aspart  0-15 Units Subcutaneous TID WC   insulin aspart  0-5 Units Subcutaneous QHS   metoprolol succinate  25 mg Oral Daily   minocycline  200 mg Oral BID   mirtazapine  15 mg Oral QHS   pantoprazole  40 mg Oral Daily   sertraline  50 mg Oral Daily   tamsulosin  0.4 mg Oral Daily   thiamine  100 mg Oral Daily   warfarin  5 mg Oral q1600   Warfarin - Pharmacist Dosing Inpatient   Does not apply q1600   zinc sulfate  220 mg Oral Daily    Assessment: 62 yom who presented  to clinic with concern for driveline fracture and infection. Last [facility] appointment was on 04/21/2023 - INR was subtherapeutic at 1.4 and regimen at that time was 6 mg daily except 4 mg Tues/Thurs (LD 7/25).  Given low INR on admit, started fixed rate heparin 500uts/hr no up titration with heparin level 0.1 as expected  until INR is therapeutic. No s/sx of bleeding - CT image neg for bleed.  CBC stable, INR today remains 1.2.  LDH stable 120s S/p debridement of DLI infection 7/31 plan for repeat next week   Goal of Therapy:  Heparin level <0.3 units/ml INR 2-2.5 Monitor platelets by anticoagulation protocol: Yes   Plan:   warfarin 5 mg daily  INR < 1.5 while debriding  Continue heparin infusion at 500 units/hr >> (no up titrations unless discussed with MD) Monitor daily heparin level, INR, CBC, LDH, and for s/sx of bleeding    Leota Sauers Pharm.D. CPP, BCPS Clinical Pharmacist [phone] 07/10/2023 1:20 PM   [facility] pharmacy phone numbers are listed on [email or web address]

## 2023-07-10 NOTE — Plan of Care (Signed)
  Problem: Education: Goal: Patient will understand all VAD equipment and how it functions Outcome: Progressing Goal: Patient will be able to verbalize current INR target range and antiplatelet therapy for discharge home Outcome: Progressing   Problem: Cardiac: Goal: LVAD will function as expected and patient will experience no clinical alarms Outcome: Progressing   Problem: Education: Goal: Knowledge of General Education information will improve Description: Including pain rating scale, medication(s)/side effects and non-pharmacologic comfort measures Outcome: Progressing   Problem: Health Behavior/Discharge Planning: Goal: Ability to manage health-related needs will improve Outcome: Progressing   Problem: Clinical Measurements: Goal: Ability to maintain clinical measurements within normal limits will improve Outcome: Progressing Goal: Will remain free from infection Outcome: Progressing Goal: Diagnostic test results will improve Outcome: Progressing Goal: Respiratory complications will improve Outcome: Progressing Goal: Cardiovascular complication will be avoided Outcome: Progressing   Problem: Activity: Goal: Risk for activity intolerance will decrease Outcome: Progressing   Problem: Nutrition: Goal: Adequate nutrition will be maintained Outcome: Progressing   Problem: Coping: Goal: Level of anxiety will decrease Outcome: Progressing   Problem: Elimination: Goal: Will not experience complications related to bowel motility Outcome: Progressing Goal: Will not experience complications related to urinary retention Outcome: Progressing   Problem: Pain Managment: Goal: General experience of comfort will improve Outcome: Progressing   Problem: Safety: Goal: Ability to remain free from injury will improve Outcome: Progressing   

## 2023-07-10 NOTE — Inpatient Diabetes Management (Signed)
Inpatient Diabetes Program Recommendations  AACE/ADA: New Consensus Statement on Inpatient Glycemic Control (2015)  Target Ranges:  Prepandial:   less than 140 mg/dL      Peak postprandial:   less than 180 mg/dL (1-2 hours)      Critically ill patients:  140 - 180 mg/dL   Lab Results  Component Value Date   GLUCAP 242 (H) 07/10/2023   HGBA1C 6.3 (H) 07/09/2023    Latest Reference Range & Units 07/09/23 09:51 07/09/23 12:36 07/09/23 15:46 07/09/23 21:19 07/10/23 06:04  Glucose-Capillary 70 - 99 mg/dL 161 (H) 096 (H) 045 (H) 386 (H) 242 (H)  (H): Data is abnormally high  Review of Glycemic Control  Diabetes history: DM2 Outpatient Diabetes medications: Glucotrol 5 mg qd Current orders for Inpatient glycemic control: Novolog 0-15 units tid  Inpatient Diabetes Program Recommendations:   Please consider: -Add Novolog 0-5 units hs correction  Thank you, Darel Hong E. Deaira [name], RN, MSN, CDE  Diabetes Coordinator Inpatient Glycemic Control Team Team Pager [phone] (8am-5pm) 07/10/2023 10:42 AM

## 2023-07-11 DIAGNOSIS — T827XXA Infection and inflammatory reaction due to other cardiac and vascular devices, implants and grafts, initial encounter: Secondary | ICD-10-CM | POA: Diagnosis not present

## 2023-07-11 LAB — GLUCOSE, CAPILLARY
Glucose-Capillary: 172 mg/dL — ABNORMAL HIGH (ref 70–99)
Glucose-Capillary: 225 mg/dL — ABNORMAL HIGH (ref 70–99)
Glucose-Capillary: 185 mg/dL — ABNORMAL HIGH (ref 70–99)
Glucose-Capillary: 288 mg/dL — ABNORMAL HIGH (ref 70–99)
Glucose-Capillary: 278 mg/dL — ABNORMAL HIGH (ref 70–99)

## 2023-07-11 MED ORDER — WARFARIN SODIUM 4 MG PO TABS
4.0000 mg | ORAL_TABLET | Freq: Every day | ORAL | Status: DC
  Administered 2023-07-11 – 2023-07-12 (×2): 4 mg via ORAL
  Filled 2023-07-11 (×2): qty 1

## 2023-07-11 NOTE — Progress Notes (Signed)
ANTICOAGULATION CONSULT NOTE -   Pharmacy Consult for warfarin + fixed-dose heparin Indication:  LVAD  Allergies  Allergen Reactions   Vancomycin Nausea And Vomiting and Other (See Comments)    Severe joint pain    Patient Measurements: Height: 5\' 10"  (177.8 cm) Weight: 88.3 kg (194 lb 10.7 oz) IBW/kg (Calculated) : 73 Heparin Dosing Weight: 91.4 kg   Vital Signs: Temp: 98 F (36.7 C) (08/02 0731) Temp Source: Oral (08/02 0731) BP: 122/88 (08/02 0936) Pulse Rate: 95 (08/02 0936)  Labs: Recent Labs    07/09/23 0258 07/10/23 0306 07/11/23 0111  HGB 11.6* 11.0* 11.6*  HCT 34.6* 32.5* 34.9*  PLT 187 215 213  LABPROT 14.5 15.7* 17.4*  INR 1.1 1.2 1.4*  HEPARINUNFRC <0.10* <0.10* <0.10*  CREATININE 0.99 1.20 1.04    Estimated Creatinine Clearance: 82.4 mL/min (by C-G formula based on SCr of 1.04 mg/dL).   Medical History: Past Medical History:  Diagnosis Date   "    Arthritis    CAD (coronary artery disease)    a. s/p CABG in 11/2019 with LIMA-LAD, SVG-OM1, SVG-PDA and SVG-D1   CHF (congestive heart failure) (HCC)    a. EF < 20% by echo in 11/2019   Essential hypertension    PAF (paroxysmal atrial fibrillation) (HCC)    Type 2 diabetes mellitus (HCC)     Medications:  Scheduled:   allopurinol  100 mg Oral Daily   clonazepam  0.25 mg Oral BID   fluticasone furoate-vilanterol  1 puff Inhalation Daily   And   umeclidinium bromide  1 puff Inhalation Daily   insulin aspart  0-15 Units Subcutaneous TID WC   insulin aspart  0-5 Units Subcutaneous QHS   metoprolol succinate  25 mg Oral Daily   minocycline  200 mg Oral BID   mirtazapine  15 mg Oral QHS   pantoprazole  40 mg Oral Daily   sertraline  50 mg Oral Daily   tamsulosin  0.4 mg Oral Daily   thiamine  100 mg Oral Daily   warfarin  4 mg Oral q1600   Warfarin - Pharmacist Dosing Inpatient   Does not apply q1600   zinc sulfate  220 mg Oral Daily    Assessment: 62 yom who presented to clinic with  concern for driveline fracture and infection. Last [facility] appointment was on 04/21/2023 - INR was subtherapeutic at 1.4 and regimen at that time was 6 mg daily except 4 mg Tues/Thurs (LD 7/25).  Given low INR on admit, started fixed rate heparin 500uts/hr no up titration with heparin level 0.1 as expected  until INR is > 1.8 No s/sx of bleeding - CT image neg for bleed. Warfarin low dose keeping INR about 1.5 for ongoing debridements  - next planned next week CBC stable, INR today remains 1.4  LDH stable 120s S/p debridement of DLI infection 7/31 plan for repeat next week   Goal of Therapy:  Heparin level <0.3 units/ml INR 2-2.5 Monitor platelets by anticoagulation protocol: Yes   Plan:   warfarin 4 mg daily  INR  1.5 while debriding  Continue heparin infusion at 500 units/hr >> (no up titrations unless discussed with MD) Monitor daily heparin level, INR, CBC, LDH, and for s/sx of bleeding    Leota Sauers Pharm.D. CPP, BCPS Clinical Pharmacist [phone] 07/11/2023 9:52 AM   [facility] pharmacy phone numbers are listed on [email or web address]

## 2023-07-11 NOTE — Progress Notes (Addendum)
Continued intermittent leakage alarms around wound vac. Discussed with Dr. Maren Beach. Will remove wound vac and perform daily wound care with VASHE wound solution wet to dry.  Drive line exit site: Existing VAD dressing removed and site care performed using sterile technique. Wound bed cleansed with VASHE solution and sterile 4x4 gauze through light manual debridement. Skin surrounding wound bed cleaned with Chlora prep applicators x 2, allowed to dry. 1 VASHE moistened 4 x 4s placed in wound bed, covered with several dry 4 x 4s. Exit site tunnels approximately 4cm. Drive line unincorporated. Small amount of serosanguinous/yellow murky drainage noted. No redness, tenderness, foul odor or rash noted. Drive line anchor secured. Continue daily wet to dry dressing changes using VASHE solution per bedside RN or VAD coordinator. Next dressing change due 07/12/23 by bedside nurse.       Simmie Davies RN, BSN VAD Coordinator 24/7 Pager [phone]

## 2023-07-11 NOTE — Progress Notes (Signed)
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 Started on IV antibiotics.   Patient presented to VAD clinic 7/29 for driveline communication fault- stating that he has had "alarms every couple hrs from his VAD for a couple months." Pt has no showed and cancelled all appointments in the past 2 months. Pt also had green/blue drainage visible through his driveline bandage. (Dressing change and wound cx obtained). Pt states that he has experienced dizziness associated with a fall multiple times over the past couple months but no signs of bleeding. Controller and modular cable exchanged. No further communication fault alarms noted.   CT Chest/Abd/Pelvis 7/29 1. Similar appearance of mild circumferential thickening of the fat plane surrounding the drive line in the upper abdomen extending from the pump to the skin which may be chronic or represent an infectious process. No drainable fluid collection.  Pt had drive line debridement in OR with placement of instillation wound vac on 07/09/23 with Dr Donata Clay.   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 Currently on IV Unasyn 3 g Q 6 hrs and IV Daptomycin 800 mg daily per ID team. Increase in WBC and low grade temperature overnight. Blood cultures obtained last afternoon.  Pt laying in bed. Denies complaints. Pt not wearing abdominal binder. Wound vac turned off upon assessment. VAD Coordinator restarted VAC therapy with good seal. Bedside RN notified if alarm reoccurs to restart wound vac and reassess seal due to large hernia present at wound vac site. VAD Coordinator will reassess this afternoon.  Vital signs: Temp: 98 HR: 89 Doppler Pressure: 112 Automatic BP: 115/102 (109) O2 Sat: 98% on RA Wt: 193.3>194.8>196.4>194.7 lbs  LVAD interrogation reveals:  Speed: 5600 Flow: 4.2 Power: 4.2 w PI: 4.4 Alarms: none Events: 30-50  PI events Hct: 34  Fixed speed:  5600 Low speed limit:  5300  Drive Line: Wound vac dressing clean, dry and intact. Therapy inactive alarm present. This VAD Coordinator restarted therapy and assessed for leak. Good seal obtained. Suction -125 mmHg. VASHE instillation volume 10mL q2 hrs for 10 minutes dwell. Sponge "bridge" intact over draping. Secured "bridge" with additional draping. Plan for drive line debridement and wound vac change next week in OR with Dr Donata Clay on Wednesday 07/16/23.   Labs:  LDH trend: 121>133>130>138  INR trend: 1.0>1.1>1.2>1.4  Anticoagulation Plan: -INR Goal: 2.0 - 2.5 -ASA Dose: none  ICD: N/A  Infection:  07/07/23>> blood cultures>> no growth 2 days; final pending 07/07/23>> wound cx>> few pseudomonas aeruginosa; final pending 07/09/23>> OR driveline cx>> Few pseudomonas aeruginosa  >> pending 07/09/23>> blood cultures>>NGTD  Drips:  Heparin 500 units/hr  Plan/Recommendations:  Page VAD coordinator with equipment issues or driveline problems Daily drive line wet to dry dressing changes with VASHE solution.   Simmie Davies RN,BSN VAD Coordinator  Office: [phone]  24/7 Pager: [phone]

## 2023-07-11 NOTE — Inpatient Diabetes Management (Signed)
Inpatient Diabetes Program Recommendations  AACE/ADA: New Consensus Statement on Inpatient Glycemic Control   Target Ranges:  Prepandial:   less than 140 mg/dL      Peak postprandial:   less than 180 mg/dL (1-2 hours)      Critically ill patients:  140 - 180 mg/dL    Latest Reference Range & Units 07/10/23 06:04 07/10/23 10:51 07/10/23 16:12 07/10/23 21:11 07/11/23 06:03 07/11/23 08:17  Glucose-Capillary 70 - 99 mg/dL 562 (H) 130 (H) 865 (H) 173 (H) 172 (H) 225 (H)   Review of Glycemic Control  Diabetes history: DM2 Outpatient Diabetes medications: Glipizide 5 mg daily Current orders for Inpatient glycemic control: Novolog 0-15 units TID with meals, Novolog 0-5 units QHS  Inpatient Diabetes Program Recommendations:    Insulin: Please consider ordering Novolog 4 units TID with meals for meal coverage if patient eats at least 50% of meals.  Thanks, Orlando Penner, RN, MSN, CDCES Diabetes Coordinator Inpatient Diabetes Program [phone] (Team Pager from 8am to 5pm)

## 2023-07-11 NOTE — Progress Notes (Signed)
Advanced Heart Failure VAD Team Note  PCP-Cardiologist: Nona Dell, MD  [facility]: Dr. Shirlee Latch   Subjective:    7/29: admitted w/ suspected DL fracture and DL infection. P/w "driveline communication fault" error message on LVAD. No pump stoppages. LVAD coordinator replaced modular cable and controller with resolution of driveline communication fault signal. CT C/A/P c/w prior imaging w/ similar appearance of mild circumferential thickening of the fat plane surrounding the drive line in the upper abdomen extending from the pump to the skin which may be chronic or represent an infectious process. No drainable fluid collection. Wound + BCx collected. Started on empiric daptomycin/Unasyn per ID.  7/30: Wound Cx growing Pseudomonas Aeruginosa (pan-sensitive) and S. aureus. Cefepime added 7/31: OR for wound debridement + application of wound vac.    Remains on Dapto, Cefepime and minocycline. Surgical wound cx pending. Bcx from 7/29 NGTD. AF. WBC WNL.   On low dose heparin. INR 1.4  MAPs 90s   Feels ok today. Denies chest/abdominal pain. No dyspnea.    LVAD INTERROGATION:  HeartMate III LVAD:   Flow 4.1 liters/min, speed 5700, power 4.3, PI 5.1.  10 PI events today    Objective:    Vital Signs:   Temp:  [97.8 F (36.6 C)-98 F (36.7 C)] 98 F (36.7 C) (08/02 0731) Pulse Rate:  [71-95] 95 (08/02 0936) Resp:  [16-20] 20 (08/02 0731) BP: (106-138)/(83-102) 122/88 (08/02 0936) SpO2:  [98 %-99 %] 98 % (08/02 0731) Weight:  [88.3 kg] 88.3 kg (08/02 0606) Last BM Date : 07/10/23 Mean arterial Pressure 90s   Intake/Output:   Intake/Output Summary (Last 24 hours) at 07/11/2023 0948 Last data filed at 07/11/2023 0941 Gross per 24 hour  Intake 1205.59 ml  Output 1900 ml  Net -694.41 ml     Physical Exam    General: well appearing. NAD  HEENT: normal Neck: supple. JVD not elevated. Carotids 2+ bilat; no bruits. No lymphadenopathy or thyromegaly appreciated. Cor: Mechanical heart  sounds with LVAD hum present. Lungs: CTAB  Abdomen: soft, nontender, nondistended. No hepatosplenomegaly. No bruits or masses. Good bowel sounds. Large ventral hernia, Soft and easy to reduce + wound vac w/ good seal  Driveline: C/D/I; securement device intact and driveline incorporated Extremities: no cyanosis, clubbing, rash, no edema Neuro: alert & orientedx3, cranial nerves grossly intact. moves all 4 extremities w/o difficulty. Affect pleasant   Telemetry   NSR 90s   EKG    N/A   Labs   Basic Metabolic Panel: Recent Labs  Lab 07/07/23 1436 07/08/23 0753 07/09/23 0258 07/10/23 0306 07/11/23 0111  NA 136 138 135 131* 137  K 3.7 4.2 3.7 3.7 3.6  CL 103 101 99 98 100  CO2 23 24 24  21* 24  GLUCOSE 200* 207* 201* 305* 161*  BUN 24* 19 21 22 19   CREATININE 1.25* 0.98 0.99 1.20 1.04  CALCIUM 8.6* 9.0 8.8* 8.8* 8.9  MG 1.6*  --   --   --   --     Liver Function Tests: Recent Labs  Lab 07/07/23 1436  AST 17  ALT 24  ALKPHOS 93  BILITOT 0.3  PROT 6.0*  ALBUMIN 3.0*   No results for input(s): "LIPASE", "AMYLASE" in the last 168 hours. No results for input(s): "AMMONIA" in the last 168 hours.  CBC: Recent Labs  Lab 07/07/23 1436 07/08/23 0753 07/09/23 0258 07/10/23 0306 07/11/23 0111  WBC 7.8 7.5 6.8 11.2* 9.9  HGB 11.8* 11.9* 11.6* 11.0* 11.6*  HCT 35.6* 36.4* 34.6*  32.5* 34.9*  MCV 85.4 83.5 82.2 83.5 84.7  PLT 199 189 187 215 213    INR: Recent Labs  Lab 07/07/23 1436 07/08/23 0753 07/09/23 0258 07/10/23 0306 07/11/23 0111  INR 1.0 1.0 1.1 1.2 1.4*    Other results: EKG:    Imaging   No results found.   Medications:     Scheduled Medications:  allopurinol  100 mg Oral Daily   clonazepam  0.25 mg Oral BID   fluticasone furoate-vilanterol  1 puff Inhalation Daily   And   umeclidinium bromide  1 puff Inhalation Daily   insulin aspart  0-15 Units Subcutaneous TID WC   insulin aspart  0-5 Units Subcutaneous QHS   metoprolol  succinate  25 mg Oral Daily   minocycline  200 mg Oral BID   mirtazapine  15 mg Oral QHS   pantoprazole  40 mg Oral Daily   sertraline  50 mg Oral Daily   tamsulosin  0.4 mg Oral Daily   thiamine  100 mg Oral Daily   warfarin  5 mg Oral q1600   Warfarin - Pharmacist Dosing Inpatient   Does not apply q1600   zinc sulfate  220 mg Oral Daily    Infusions:  ceFEPime (MAXIPIME) IV Stopped (07/11/23 1610)   DAPTOmycin (CUBICIN) 800 mg in sodium chloride 0.9 % IVPB Stopped (07/10/23 1510)   heparin 500 Units/hr (07/11/23 0941)    PRN Medications: acetaminophen, fluticasone   Patient Profile   62 y.o. 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 type 2 diabetes mellitus, COPD, and hypertension. Now s/p Heartmate 3 LVAD. Post implant course c/b repeated DL infections. Now admitted w/ suspected DL fracture and DL infection. P/w "driveline communication fault" error message on LVAD. No pump stoppages. Also w/ significant greenish drainage from DL, in setting of poor compliance w/ suppressive minocycline. Recent fall ~2 wks prior, likely cause of DL trauma.    Assessment/Plan:    1.  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  Cultures have grown acinetobacter and MRSA. He was readmitted with driveline infection in 2/24, completed course of daptomycin for MRSA. DL site with new green/yellow drainage. Now readmitted w/ suspected DL fracture and DL infection. P/w "driveline communication fault" error message on LVAD. No pump stoppages. Also w/ significant greenish drainage from DL, in setting of poor compliance w/ suppressive minocycline. Recent fall ~2 wks prior, likely cause of DL trauma. LVAD coordinator replaced modular cable and controller  with resolution of driveline communication fault signal. CT C/A/P c/w prior imaging w/ similar appearance of mild circumferential thickening of the fat plane surrounding the drive line in the upper abdomen extending from the pump to the skin which may be chronic or represent an infectious process. No drainable fluid collection. Wound + BCx collected. Started on empiric daptomycin/Unasyn per ID. Initial wound Cx + for Pseudomonas Aeruginosa (pan-sensitive) and S. aureus. Cefepime added. Now on Dapto, Cefepime and minocycline. BCx NGTD. Taken to OR 7/31 for wound debridement + application of wound vac - continue abx per ID. Appreciate their assistance.  - Will plan to return to OR next week for washout and vac change.    2. Suspected DL fracture with multiple recent falls - Has had multiple falls during the last 2 months,  one direct trauma to mid sternum (fell on cinder block) - CT chest/abd/pelvis per above  - CXR w/ LVAD partially imaged. The associated lead noted to be intact w/ no fracture or kink  - plan to send imaging to [facility] - LVAD coordinators to changed modular cable and controller - Not candidate for VAD exchange with recurrent drug abuse  3.  Chronic systolic CHF: Echo 10/21 with EF 20-25%, mildly decreased RV function. LHC/RHC in 12/21 with patent grafts, low output. Suspect mixed ischemic/nonischemic cardiomyopathy (prior heavy ETOH and drugs as well as CAD). Admitted with cardiogenic shock in 12/21, had placement of Impella 5.5 initially, now s/p Heartmate 3 LVAD on 11/17/20.  Ramp echo 2/23 with speed decreased to 5500 rpm. Speed now back to 5600 rpm.   - He is not volume overloaded on exam.      - Continue current Toprol XL 25 mg daily.  - He does not need a diuretic.   - INR 1.4 today. INR goal 2-2.5. Continue heparin until completion of planned procedures    4. CKD stage 3: Prior CTA abdomen/pelvis with high-grade stenosis of proximal right renal artery.  - Would consider  eventual reassessment of renal artery stenosis and possible treatment  - SCr stable   5.  CAD: S/p CABG 12/20.  LHC pre-VAD with patent grafts, no target for intervention. - Continue Crestor 20 mg daily.   6.  Atrial fibrillation: Paroxysmal.  S/p Maze and LA appendage clip with CABG in 12/20. Maintaining SR.  - Continue Toprol XL 25 mg daily.  - Off amiodarone with hyperthyroidism.  7. Type 2 diabetes: improved control, Hgb A1c down from 12.1>>6.3  - continue SSI   8. 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 He says last use of methamphetamine was 1 month ago.  - USD this admit negative (however collected > 48 hrs post admit)   9. 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  Followed by endocrinology. He is now off amiodarone. He had been on methimazole but has not been taking recently. TSH was high on admit and free T4 normal  - Will NOT restart methimazole   10. Anemia (chronic blood loss):  Hgb stable, 11.6. No gross bleeding.  - follow CBP   11. COPD: Prior smoker.  - Continue Trelegy.   12. PVCs/NSVT:  - Now off amiodarone.   13. Depression: Has turned to substance abuse to cope.   - Has been referred to OP psych in [place]  - Consult TOC while IP to assist with substance abuse   14.  Gout: Continue allopurinol, he has prn colchicine.   15. HTN: MAPs elevated. In setting of known Rt RAS.  - monitor closely, if remains elevated may need reassessment of renal artery stenosis and possible treatment - may need PRN hydralazine   I reviewed the LVAD parameters from today, and compared the results to the patient's prior recorded data.  No programming changes were made.  The LVAD is functioning within specified parameters.  The patient performs LVAD self-test daily.  LVAD interrogation was negative for any significant power changes, alarms or PI events/speed drops.  LVAD equipment check completed and is in good working order.  Back-up equipment  present.   LVAD education done on emergency procedures and precautions and reviewed exit site care.  Length of Stay: [address], [place] 07/11/2023, 9:48 AM  VAD Team --- VAD ISSUES ONLY--- Pager [phone] (7am - 7am)  Advanced Heart Failure Team  Pager [phone] (  M-F; 7a - 5p)  Please contact [facility] for night-coverage after hours (5p -7a ) and weekends on [email or web address]

## 2023-07-11 NOTE — Progress Notes (Signed)
Id brief note   Cx stain with gpc; growing pseudomonas   -f/u final cx -continue cefepime/minocycline/dapto. If no other gpc grow, could finish 4 weeks cefepime, and stop dapto, and continue minocycline; after 4 weeks will see if can use cipro for pseudomonas suppression along with mino for hx mrsa/acinetobacter -dr Odette Fraction will follow cultures this weekend and available for question

## 2023-07-12 DIAGNOSIS — T827XXA Infection and inflammatory reaction due to other cardiac and vascular devices, implants and grafts, initial encounter: Secondary | ICD-10-CM | POA: Diagnosis not present

## 2023-07-12 LAB — GLUCOSE, CAPILLARY
Glucose-Capillary: 187 mg/dL — ABNORMAL HIGH (ref 70–99)
Glucose-Capillary: 167 mg/dL — ABNORMAL HIGH (ref 70–99)
Glucose-Capillary: 174 mg/dL — ABNORMAL HIGH (ref 70–99)
Glucose-Capillary: 220 mg/dL — ABNORMAL HIGH (ref 70–99)

## 2023-07-12 NOTE — Progress Notes (Signed)
ANTICOAGULATION CONSULT NOTE  Pharmacy Consult for warfarin + fixed-dose heparin Indication:  LVAD  Allergies  Allergen Reactions   Vancomycin Nausea And Vomiting and Other (See Comments)    Severe joint pain    Patient Measurements: Height: 5\' 10"  (177.8 cm) Weight: 89.9 kg (198 lb 3.2 oz) IBW/kg (Calculated) : 73 Heparin Dosing Weight: 91.4 kg   Vital Signs: Temp: 97.7 F (36.5 C) (08/03 0800) Temp Source: Oral (08/03 0800) BP: 102/81 (08/03 0800) Pulse Rate: 93 (08/03 0800)  Labs: Recent Labs    07/10/23 0306 07/11/23 0111 07/12/23 0046  HGB 11.0* 11.6* 10.9*  HCT 32.5* 34.9* 33.3*  PLT 215 213 202  LABPROT 15.7* 17.4* 17.5*  INR 1.2 1.4* 1.4*  HEPARINUNFRC <0.10* <0.10* <0.10*  CREATININE 1.20 1.04 1.13    Estimated Creatinine Clearance: 76.5 mL/min (by C-G formula based on SCr of 1.13 mg/dL).   Medical History: Past Medical History:  Diagnosis Date   "    Arthritis    CAD (coronary artery disease)    a. s/p CABG in 11/2019 with LIMA-LAD, SVG-OM1, SVG-PDA and SVG-D1   CHF (congestive heart failure) (HCC)    a. EF < 20% by echo in 11/2019   Essential hypertension    PAF (paroxysmal atrial fibrillation) (HCC)    Type 2 diabetes mellitus (HCC)     Medications:  Scheduled:   allopurinol  100 mg Oral Daily   clonazepam  0.25 mg Oral BID   fluticasone furoate-vilanterol  1 puff Inhalation Daily   And   umeclidinium bromide  1 puff Inhalation Daily   insulin aspart  0-15 Units Subcutaneous TID WC   insulin aspart  0-5 Units Subcutaneous QHS   metoprolol succinate  25 mg Oral Daily   minocycline  200 mg Oral BID   mirtazapine  15 mg Oral QHS   pantoprazole  40 mg Oral Daily   sertraline  50 mg Oral Daily   tamsulosin  0.4 mg Oral Daily   thiamine  100 mg Oral Daily   warfarin  4 mg Oral q1600   Warfarin - Pharmacist Dosing Inpatient   Does not apply q1600   zinc sulfate  220 mg Oral Daily    Assessment: 62 yom who presented to clinic with  concern for driveline fracture and infection. Last [facility] appointment was on 04/21/2023 - INR was subtherapeutic at 1.4 and regimen at that time was 6 mg daily except 4 mg Tues/Thurs (LD 7/25).  Given low INR on admit, started fixed rate heparin 500 units/hr until INR >1.8. Heparin level remains undetectable as expected. Hgb 10.9, plt 202. LDH stable at 164. No s/sx of bleeding. INR remains at 1.4 today.   S/p debridement of DLI infection 7/31- plan for repeat next week.   Goal of Therapy:  Heparin level <0.3 units/ml INR 2-2.5  - keeping lower INR goal ~1.5 given plan for debridement upcoming week Monitor platelets by anticoagulation protocol: Yes   Plan:  Warfarin 4 mg tonight   Continue heparin infusion at 500 units/hr >> (no up titrations unless discussed with MD) Monitor daily heparin level, INR, CBC, LDH, and for s/sx of bleeding   Thank you for allowing pharmacy to participate in this patient's care,  Sherron Monday, PharmD, BCCCP Clinical Pharmacist  Phone: [phone] 07/12/2023 10:40 AM  Please check AMION for all [facility] phone numbers After 10:00 PM, call Main Pharmacy [phone]

## 2023-07-12 NOTE — Progress Notes (Signed)
Advanced Heart Failure VAD Team Note  PCP-Cardiologist: Nona Dell, MD  [facility]: Dr. Shirlee Latch   Subjective:    7/29: admitted w/ suspected DL fracture and DL infection. P/w "driveline communication fault" error message on LVAD. No pump stoppages. LVAD coordinator replaced modular cable and controller with resolution of driveline communication fault signal. CT C/A/P c/w prior imaging w/ similar appearance of mild circumferential thickening of the fat plane surrounding the drive line in the upper abdomen extending from the pump to the skin which may be chronic or represent an infectious process. No drainable fluid collection. Wound + BCx collected. Started on empiric daptomycin/Unasyn per ID.  7/30: Wound Cx growing Pseudomonas Aeruginosa (pan-sensitive) and S. aureus. Cefepime added 7/31: OR for wound debridement + application of wound vac.    Remains on Dapto, Cefepime and minocycline. Surgical wound cx pending. Bcx from 7/29 NGTD. AF. WBC WNL.   MAP stable; INR 1.4. Continue hep gtt 500u/hr.   LVAD INTERROGATION:  HeartMate III LVAD:   Flow 3.9 liters/min, speed 5600, power 4.3, PI 5.2. 8 PI events today    Objective:    Vital Signs:   Temp:  [97.7 F (36.5 C)-98.8 F (37.1 C)] 98.5 F (36.9 C) (08/03 1100) Pulse Rate:  [73-102] 88 (08/03 1100) Resp:  [13-19] 16 (08/03 1100) BP: (89-124)/(81-106) 117/106 (08/03 1100) SpO2:  [96 %-100 %] 97 % (08/03 0835) Weight:  [89.9 kg] 89.9 kg (08/03 0630) Last BM Date : 07/11/23 Mean arterial Pressure 90s   Intake/Output:   Intake/Output Summary (Last 24 hours) at 07/12/2023 1155 Last data filed at 07/12/2023 0800 Gross per 24 hour  Intake 953.05 ml  Output 1175 ml  Net -221.95 ml     Physical Exam    General: well appearing. NAD  HEENT: normal Neck: supple. JVD not elevated. Carotids 2+ bilat; no bruits. No lymphadenopathy or thyromegaly appreciated. Cor: Mechanical heart sounds with LVAD hum present. Lungs: CTA B/L Abdomen:  soft, nontender, nondistended. No hepatosplenomegaly. No bruits or masses. Good bowel sounds. Large hernia; wound site looks good.  Driveline: C/D/I; securement device intact and driveline incorporated Extremities: no cyanosis, clubbing, rash, no edema Neuro: alert & orientedx3, cranial nerves grossly intact. moves all 4 extremities w/o difficulty. Affect pleasant   Telemetry   NSR 90s   EKG    N/A   Labs   Basic Metabolic Panel: Recent Labs  Lab 07/07/23 1436 07/08/23 0753 07/09/23 0258 07/10/23 0306 07/11/23 0111 07/12/23 0046  NA 136 138 135 131* 137 135  K 3.7 4.2 3.7 3.7 3.6 3.8  CL 103 101 99 98 100 99  CO2 23 24 24  21* 24 24  GLUCOSE 200* 207* 201* 305* 161* 227*  BUN 24* 19 21 22 19  26*  CREATININE 1.25* 0.98 0.99 1.20 1.04 1.13  CALCIUM 8.6* 9.0 8.8* 8.8* 8.9 8.8*  MG 1.6*  --   --   --   --   --     Liver Function Tests: Recent Labs  Lab 07/07/23 1436  AST 17  ALT 24  ALKPHOS 93  BILITOT 0.3  PROT 6.0*  ALBUMIN 3.0*   No results for input(s): "LIPASE", "AMYLASE" in the last 168 hours. No results for input(s): "AMMONIA" in the last 168 hours.  CBC: Recent Labs  Lab 07/08/23 0753 07/09/23 0258 07/10/23 0306 07/11/23 0111 07/12/23 0046  WBC 7.5 6.8 11.2* 9.9 8.4  HGB 11.9* 11.6* 11.0* 11.6* 10.9*  HCT 36.4* 34.6* 32.5* 34.9* 33.3*  MCV 83.5 82.2 83.5 84.7  85.6  PLT 189 187 215 213 202    INR: Recent Labs  Lab 07/08/23 0753 07/09/23 0258 07/10/23 0306 07/11/23 0111 07/12/23 0046  INR 1.0 1.1 1.2 1.4* 1.4*    Other results: EKG:    Imaging   No results found.   Medications:     Scheduled Medications:  allopurinol  100 mg Oral Daily   clonazepam  0.25 mg Oral BID   fluticasone furoate-vilanterol  1 puff Inhalation Daily   And   umeclidinium bromide  1 puff Inhalation Daily   insulin aspart  0-15 Units Subcutaneous TID WC   insulin aspart  0-5 Units Subcutaneous QHS   metoprolol succinate  25 mg Oral Daily    minocycline  200 mg Oral BID   mirtazapine  15 mg Oral QHS   pantoprazole  40 mg Oral Daily   sertraline  50 mg Oral Daily   tamsulosin  0.4 mg Oral Daily   thiamine  100 mg Oral Daily   warfarin  4 mg Oral q1600   Warfarin - Pharmacist Dosing Inpatient   Does not apply q1600   zinc sulfate  220 mg Oral Daily    Infusions:  ceFEPime (MAXIPIME) IV 2 g (07/12/23 0508)   DAPTOmycin (CUBICIN) 800 mg in sodium chloride 0.9 % IVPB Stopped (07/11/23 1400)   heparin 500 Units/hr (07/11/23 2315)    PRN Medications: acetaminophen, fluticasone   Patient Profile   62 y.o. 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 type 2 diabetes mellitus, COPD, and hypertension. Now s/p Heartmate 3 LVAD. Post implant course c/b repeated DL infections. Now admitted w/ suspected DL fracture and DL infection. P/w "driveline communication fault" error message on LVAD. No pump stoppages. Also w/ significant greenish drainage from DL, in setting of poor compliance w/ suppressive minocycline. Recent fall ~2 wks prior, likely cause of DL trauma.    Assessment/Plan:    1.  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  Cultures have grown acinetobacter and MRSA. He was readmitted with driveline infection in 2/24, completed course of daptomycin for MRSA. DL site with new green/yellow drainage. Now readmitted w/ suspected DL fracture and DL infection. P/w "driveline communication fault" error message on LVAD. No pump stoppages. Also w/ significant greenish drainage from DL, in setting of poor compliance w/ suppressive minocycline. Recent fall ~2 wks prior, likely cause of DL trauma. LVAD coordinator replaced modular cable and controller with resolution of driveline  communication fault signal. CT C/A/P c/w prior imaging w/ similar appearance of mild circumferential thickening of the fat plane surrounding the drive line in the upper abdomen extending from the pump to the skin which may be chronic or represent an infectious process. No drainable fluid collection. Wound + BCx collected. Started on empiric daptomycin/Unasyn per ID. Initial wound Cx + for Pseudomonas Aeruginosa (pan-sensitive) and S. aureus. Cefepime added. Now on Dapto, Cefepime and minocycline. BCx NGTD. Taken to OR 7/31 for wound debridement + application of wound vac - continue abx per ID. Appreciate their assistance.  - Will plan to return to OR next week for washout and vac change.   2. Suspected DL fracture with multiple recent falls - Has had multiple falls during the last 2 months, one direct trauma to mid sternum (fell on  cinder block) - CT chest/abd/pelvis per above  - CXR w/ LVAD partially imaged. The associated lead noted to be intact w/ no fracture or kink  - plan to send imaging to [facility] - LVAD coordinators to changed modular cable and controller - Not candidate for VAD exchange with recurrent drug abuse  3.  Chronic systolic CHF: Echo 10/21 with EF 20-25%, mildly decreased RV function. LHC/RHC in 12/21 with patent grafts, low output. Suspect mixed ischemic/nonischemic cardiomyopathy (prior heavy ETOH and drugs as well as CAD). Admitted with cardiogenic shock in 12/21, had placement of Impella 5.5 initially, now s/p Heartmate 3 LVAD on 11/17/20.  Ramp echo 2/23 with speed decreased to 5500 rpm. Speed now back to 5600 rpm.   - He is not volume overloaded on exam.      - Continue current Toprol XL 25 mg daily.  - He does not need a diuretic.   - INR 1.4 today; continue hep gtt  - Euvolemic on exam. No alarms.   4. CKD stage 3: Prior CTA abdomen/pelvis with high-grade stenosis of proximal right renal artery.  - Would consider eventual reassessment of renal artery stenosis and  possible treatment  - SCr stable   5.  CAD: S/p CABG 12/20.  LHC pre-VAD with patent grafts, no target for intervention. - Continue Crestor 20 mg daily.   6.  Atrial fibrillation: Paroxysmal.  S/p Maze and LA appendage clip with CABG in 12/20. Maintaining SR.  - Continue Toprol XL 25 mg daily.  - Off amiodarone with hyperthyroidism.  7. Type 2 diabetes: improved control, Hgb A1c down from 12.1>>6.3  - continue SSI   8. 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 He says last use of methamphetamine was 1 month ago.  - USD this admit negative (however collected > 48 hrs post admit)   9. 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  Followed by endocrinology. He is now off amiodarone. He had been on methimazole but has not been taking recently. TSH was high on admit and free T4 normal  - Will NOT restart methimazole   10. Anemia (chronic blood loss):  Hgb stable, 11.6. No gross bleeding.  - follow CBP   11. COPD: Prior smoker.  - Continue Trelegy.   12. PVCs/NSVT:  - Now off amiodarone.   13. Depression: Has turned to substance abuse to cope.   - Has been referred to OP psych in [place]  - Consult TOC while IP to assist with substance abuse   14.  Gout: Continue allopurinol, he has prn colchicine.   15. HTN: MAPs elevated. In setting of known Rt RAS.  - monitor closely, if remains elevated may need reassessment of renal artery stenosis and possible treatment - may need PRN hydralazine   I reviewed the LVAD parameters from today, and compared the results to the patient's prior recorded data.  No programming changes were made.  The LVAD is functioning within specified parameters.  The patient performs LVAD self-test daily.  LVAD interrogation was negative for any significant power changes, alarms or PI events/speed drops.  LVAD equipment check completed and is in good working order.  Back-up equipment present.   LVAD education done on emergency procedures and  precautions and reviewed exit site care.  Length of Stay: 5   , DO 07/12/2023, 11:55 AM  VAD Team --- VAD ISSUES ONLY--- Pager [phone] (7am - 7am)  Advanced Heart Failure Team  Pager [phone] (M-F; 7a - 5p)  Please contact [facility]  for night-coverage after hours (5p -7a ) and weekends on [email or web address]

## 2023-07-12 NOTE — Progress Notes (Signed)
Pharmacy Antibiotic Note  Kevin Underwood. is a 62 y.o. male admitted on 07/07/2023 with  driveline infection . Pharmacy has been consulted for daptomycin and cefepime dosing.   Scr 1.13 (CrCl 76 mL/min). WBC 8.4 (WNL). Afebrile. Remains on daptomycin and cefepime along with minocycline. ID following - awaiting final cx results (currently growing pan-sen pseudomonas) to determine regimen.   Plan: Daptomycin 800 mg every 24 hours Cefepime 2g IV every 8 hours Minocycline 200 mg BID F/u blood and driveline cx and continue to monitor renal function Monitor CK weekly  Height: 5\' 10"  (177.8 cm) Weight: 89.9 kg (198 lb 3.2 oz) IBW/kg (Calculated) : 73  Temp (24hrs), Avg:98.3 F (36.8 C), Min:97.7 F (36.5 C), Max:98.8 F (37.1 C)  Recent Labs  Lab 07/08/23 0753 07/09/23 0258 07/10/23 0306 07/11/23 0111 07/12/23 0046  WBC 7.5 6.8 11.2* 9.9 8.4  CREATININE 0.98 0.99 1.20 1.04 1.13    Estimated Creatinine Clearance: 76.5 mL/min (by C-G formula based on SCr of 1.13 mg/dL).    Allergies  Allergen Reactions   Vancomycin Nausea And Vomiting and Other (See Comments)    Severe joint pain    Antimicrobials this admission: 7/29 Daptomycin >>  7/29 Unasyn >> 7/31 7/31 Minocycline >>  7/31 Cefepime >>   Microbiology results: 7/29 BCx: ngtd 7/29 Driveline Wound Cx: pseudomonas/MRSA  7/31 Driveline abscess cx: pseudomonas (pan-sensitive)  Thank you for allowing pharmacy to participate in this patient's care,  Sherron Monday, PharmD, BCCCP Clinical Pharmacist  Phone: [phone] 07/12/2023 10:45 AM  Please check AMION for all [facility] phone numbers After 10:00 PM, call Main Pharmacy [phone]

## 2023-07-13 DIAGNOSIS — T827XXA Infection and inflammatory reaction due to other cardiac and vascular devices, implants and grafts, initial encounter: Secondary | ICD-10-CM | POA: Diagnosis not present

## 2023-07-13 LAB — BASIC METABOLIC PANEL
Anion gap: 12 (ref 5–15)
BUN: 27 mg/dL — ABNORMAL HIGH (ref 8–23)
CO2: 22 mmol/L (ref 22–32)
Calcium: 8.7 mg/dL — ABNORMAL LOW (ref 8.9–10.3)
Chloride: 101 mmol/L (ref 98–111)
Creatinine, Ser: 1.16 mg/dL (ref 0.61–1.24)
GFR, Estimated: >60 mL/min (ref >60)
Glucose, Bld: 166 mg/dL — ABNORMAL HIGH (ref 70–99)
Potassium: 3.7 mmol/L (ref 3.5–5.1)
Sodium: 135 mmol/L (ref 135–145)

## 2023-07-13 LAB — GLUCOSE, CAPILLARY
Glucose-Capillary: 171 mg/dL — ABNORMAL HIGH (ref 70–99)
Glucose-Capillary: 244 mg/dL — ABNORMAL HIGH (ref 70–99)
Glucose-Capillary: 310 mg/dL — ABNORMAL HIGH (ref 70–99)
Glucose-Capillary: 257 mg/dL — ABNORMAL HIGH (ref 70–99)

## 2023-07-13 LAB — HEPARIN LEVEL (UNFRACTIONATED): Heparin Unfractionated: <0.1 [IU]/mL — ABNORMAL LOW (ref 0.30–0.70)

## 2023-07-13 LAB — PROTIME-INR
INR: 1.3 — ABNORMAL HIGH (ref 0.8–1.2)
Prothrombin Time: 16.6 s — ABNORMAL HIGH (ref 11.4–15.2)

## 2023-07-13 LAB — LACTATE DEHYDROGENASE: LDH: 161 U/L (ref 98–192)

## 2023-07-13 MED ORDER — LOSARTAN POTASSIUM 25 MG PO TABS: 12.5000 mg | ORAL_TABLET | Freq: Every day | ORAL | Status: DC

## 2023-07-13 MED ORDER — WARFARIN SODIUM 5 MG PO TABS
5.0000 mg | ORAL_TABLET | Freq: Once | ORAL | Status: AC
  Administered 2023-07-13: 5 mg via ORAL
  Filled 2023-07-13: qty 1

## 2023-07-13 MED ORDER — LORAZEPAM 1 MG PO TABS
1.0000 mg | ORAL_TABLET | Freq: Once | ORAL | Status: AC
  Administered 2023-07-13: 1 mg via ORAL
  Filled 2023-07-13: qty 1

## 2023-07-13 MED ORDER — AMLODIPINE BESYLATE 5 MG PO TABS: 5.0000 mg | ORAL_TABLET | Freq: Every day | ORAL | Status: DC

## 2023-07-13 NOTE — Progress Notes (Signed)
ID brief note   Component 4 d ago  Specimen Description ABSCESS  Special Requests DRIVELINE TUNNEL INFECTION  Gram Stain NO WBC SEEN RARE GRAM POSITIVE COCCI IN PAIRS IN TETRADS  Culture FEW PSEUDOMONAS AERUGINOSA RARE STAPHYLOCOCCUS AUREUS SUSCEPTIBILITIES TO FOLLOW Performed at [facility], [address] [place], [place] [address]  Report Status PENDING  Organism ID, Bacteria PSEUDOMONAS AERUGINOSA  Resulting Agency [facility]     Susceptibility   Pseudomonas aeruginosa    MIC    CEFEPIME 2 SENSITIVE Sensitive    CEFTAZIDIME 4 SENSITIVE Sensitive    CIPROFLOXACIN 0.5 SENSITIVE Sensitive    GENTAMICIN <=1 SENSITIVE Sensitive    IMIPENEM 1 SENSITIVE Sensitive    PIP/TAZO 16 SENSITIVE Sensitive         On daptomycin cefepime and minocycline. Staoh aureus is likely MRSA No changes needed currently  Fu cx 7/31 to completion for final abtx plan  Dr Drue Second to follow starting tomorrow  Odette Fraction, MD Infectious Disease Physician [facility] [address] [place], [place] [address] Phone: [phone]  Fax: [phone]

## 2023-07-13 NOTE — Progress Notes (Signed)
ANTICOAGULATION CONSULT NOTE  Pharmacy Consult for warfarin + fixed-dose heparin Indication:  LVAD  Allergies  Allergen Reactions   Vancomycin Nausea And Vomiting and Other (See Comments)    Severe joint pain    Patient Measurements: Height: 5\' 10"  (177.8 cm) Weight: 89.9 kg (198 lb 3.2 oz) IBW/kg (Calculated) : 73 Heparin Dosing Weight: 91.4 kg   Vital Signs: Temp: 97.7 F (36.5 C) (08/04 0715) Temp Source: Oral (08/04 0715) BP: 139/99 (08/04 0716) Pulse Rate: 93 (08/04 0445)  Labs: Recent Labs    07/11/23 0111 07/12/23 0046 07/13/23 0120  HGB 11.6* 10.9*  --   HCT 34.9* 33.3*  --   PLT 213 202  --   LABPROT 17.4* 17.5* 16.6*  INR 1.4* 1.4* 1.3*  HEPARINUNFRC <0.10* <0.10* <0.10*  CREATININE 1.04 1.13  --     Estimated Creatinine Clearance: 76.5 mL/min (by C-G formula based on SCr of 1.13 mg/dL).   Medical History: Past Medical History:  Diagnosis Date   "    Arthritis    CAD (coronary artery disease)    a. s/p CABG in 11/2019 with LIMA-LAD, SVG-OM1, SVG-PDA and SVG-D1   CHF (congestive heart failure) (HCC)    a. EF < 20% by echo in 11/2019   Essential hypertension    PAF (paroxysmal atrial fibrillation) (HCC)    Type 2 diabetes mellitus (HCC)     Medications:  Scheduled:   allopurinol  100 mg Oral Daily   clonazepam  0.25 mg Oral BID   fluticasone furoate-vilanterol  1 puff Inhalation Daily   And   umeclidinium bromide  1 puff Inhalation Daily   insulin aspart  0-15 Units Subcutaneous TID WC   insulin aspart  0-5 Units Subcutaneous QHS   metoprolol succinate  25 mg Oral Daily   minocycline  200 mg Oral BID   mirtazapine  15 mg Oral QHS   pantoprazole  40 mg Oral Daily   sertraline  50 mg Oral Daily   tamsulosin  0.4 mg Oral Daily   thiamine  100 mg Oral Daily   warfarin  4 mg Oral q1600   Warfarin - Pharmacist Dosing Inpatient   Does not apply q1600   zinc sulfate  220 mg Oral Daily    Assessment: 62 yom who presented to clinic with  concern for driveline fracture and infection. Last [facility] appointment was on 04/21/2023 - INR was subtherapeutic at 1.4 and regimen at that time was 6 mg daily except 4 mg Tues/Thurs (LD 7/25).  Given low INR on admit, started fixed rate heparin 500 units/hr until INR >1.8. Heparin level remains undetectable as expected. Hgb 10.9, plt 202 on last check 8/3. LDH stable at 161. No s/sx of bleeding. INR is subtherapeutic at 1.3 today.   S/p debridement of DLI infection 7/31- plan for repeat next week.   Goal of Therapy:  Heparin level <0.3 units/ml INR 2-2.5  - keeping lower INR goal ~1.5 given plan for debridement upcoming week Monitor platelets by anticoagulation protocol: Yes   Plan:  Warfarin 5 mg tonight   Continue heparin infusion at 500 units/hr >> (no up titrations unless discussed with MD) Monitor daily heparin level, INR, CBC, LDH, and for s/sx of bleeding   Thank you for allowing pharmacy to participate in this patient's care,  Sherron Monday, PharmD, BCCCP Clinical Pharmacist  Phone: [phone] 07/13/2023 10:01 AM  Please check AMION for all [facility] Pharmacy phone numbers After 10:00 PM, call Main Pharmacy [phone]

## 2023-07-13 NOTE — Progress Notes (Signed)
Advanced Heart Failure VAD Team Note  PCP-Cardiologist: Nona Dell, MD  [facility]: Dr. Shirlee Latch   Subjective:    7/29: admitted w/ suspected DL fracture and DL infection. P/w "driveline communication fault" error message on LVAD. No pump stoppages. LVAD coordinator replaced modular cable and controller with resolution of driveline communication fault signal. CT C/A/P c/w prior imaging w/ similar appearance of mild circumferential thickening of the fat plane surrounding the drive line in the upper abdomen extending from the pump to the skin which may be chronic or represent an infectious process. No drainable fluid collection. Wound + BCx collected. Started on empiric daptomycin/Unasyn per ID.  7/30: Wound Cx growing Pseudomonas Aeruginosa (pan-sensitive) and S. aureus. Cefepime added 7/31: OR for wound debridement + application of wound vac.    Remains on Dapto, Cefepime and minocycline. Surgical wound cx pending. Bcx from 7/29 NGTD. AF. WBC WNL.   Hypertensive today; INR 1.3. Continue hep gtt 500u/hr.   LVAD INTERROGATION:  HeartMate III LVAD:   Flow 4 liters/min, speed 5600, power 4.2, PI 4.3. 7 PI events today    Objective:    Vital Signs:   Temp:  [97.7 F (36.5 C)-98.9 F (37.2 C)] 97.7 F (36.5 C) (08/04 0715) Pulse Rate:  [70-93] 93 (08/04 0445) Resp:  [12-21] 12 (08/04 0445) BP: (116-144)/(95-106) 139/99 (08/04 0716) SpO2:  [97 %] 97 % (08/04 0900) Weight:  [89.9 kg] 89.9 kg (08/04 0445) Last BM Date : 07/11/23 Mean arterial Pressure 90s   Intake/Output:   Intake/Output Summary (Last 24 hours) at 07/13/2023 1046 Last data filed at 07/13/2023 0558 Gross per 24 hour  Intake 146.45 ml  Output 2275 ml  Net -2128.55 ml     Physical Exam    General: well appearing. NAD  HEENT: normal Neck: supple. JVP 7-8cm Cor: Mechanical heart sounds with LVAD hum present. Lungs: CTA B/L Abdomen: soft, nontender, nondistended. No hepatosplenomegaly. No bruits or masses. Good bowel  sounds. Large hernia; wound site looks good.  Driveline: C/D/I; securement device intact and driveline incorporated Extremities: no cyanosis, clubbing, rash, no edema Neuro: AAOX3   Telemetry   NSR 90s   EKG    N/A   Labs   Basic Metabolic Panel: Recent Labs  Lab 07/07/23 1436 07/08/23 0753 07/09/23 0258 07/10/23 0306 07/11/23 0111 07/12/23 0046  NA 136 138 135 131* 137 135  K 3.7 4.2 3.7 3.7 3.6 3.8  CL 103 101 99 98 100 99  CO2 23 24 24  21* 24 24  GLUCOSE 200* 207* 201* 305* 161* 227*  BUN 24* 19 21 22 19  26*  CREATININE 1.25* 0.98 0.99 1.20 1.04 1.13  CALCIUM 8.6* 9.0 8.8* 8.8* 8.9 8.8*  MG 1.6*  --   --   --   --   --     Liver Function Tests: Recent Labs  Lab 07/07/23 1436  AST 17  ALT 24  ALKPHOS 93  BILITOT 0.3  PROT 6.0*  ALBUMIN 3.0*   CBC: Recent Labs  Lab 07/08/23 0753 07/09/23 0258 07/10/23 0306 07/11/23 0111 07/12/23 0046  WBC 7.5 6.8 11.2* 9.9 8.4  HGB 11.9* 11.6* 11.0* 11.6* 10.9*  HCT 36.4* 34.6* 32.5* 34.9* 33.3*  MCV 83.5 82.2 83.5 84.7 85.6  PLT 189 187 215 213 202    INR: Recent Labs  Lab 07/09/23 0258 07/10/23 0306 07/11/23 0111 07/12/23 0046 07/13/23 0120  INR 1.1 1.2 1.4* 1.4* 1.3*    Other results: EKG:    Imaging   No results found.  Medications:     Scheduled Medications:  allopurinol  100 mg Oral Daily   clonazepam  0.25 mg Oral BID   fluticasone furoate-vilanterol  1 puff Inhalation Daily   And   umeclidinium bromide  1 puff Inhalation Daily   insulin aspart  0-15 Units Subcutaneous TID WC   insulin aspart  0-5 Units Subcutaneous QHS   metoprolol succinate  25 mg Oral Daily   minocycline  200 mg Oral BID   mirtazapine  15 mg Oral QHS   pantoprazole  40 mg Oral Daily   sertraline  50 mg Oral Daily   tamsulosin  0.4 mg Oral Daily   thiamine  100 mg Oral Daily   warfarin  5 mg Oral ONCE-1600   Warfarin - Pharmacist Dosing Inpatient   Does not apply q1600   zinc sulfate  220 mg Oral Daily     Infusions:  ceFEPime (MAXIPIME) IV 2 g (07/13/23 0546)   DAPTOmycin (CUBICIN) 800 mg in sodium chloride 0.9 % IVPB 800 mg (07/12/23 1541)   heparin 500 Units/hr (07/13/23 0445)    PRN Medications: acetaminophen, fluticasone   Patient Profile   62 y.o. 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 type 2 diabetes mellitus, COPD, and hypertension. Now s/p Heartmate 3 LVAD. Post implant course c/b repeated DL infections. Now admitted w/ suspected DL fracture and DL infection. P/w "driveline communication fault" error message on LVAD. No pump stoppages. Also w/ significant greenish drainage from DL, in setting of poor compliance w/ suppressive minocycline. Recent fall ~2 wks prior, likely cause of DL trauma.    Assessment/Plan:    1.  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  Cultures have grown acinetobacter and MRSA. He was readmitted with driveline infection in 2/24, completed course of daptomycin for MRSA. DL site with new green/yellow drainage. Now readmitted w/ suspected DL fracture and DL infection. P/w "driveline communication fault" error message on LVAD. No pump stoppages. Also w/ significant greenish drainage from DL, in setting of poor compliance w/ suppressive minocycline. Recent fall ~2 wks prior, likely cause of DL trauma. LVAD coordinator replaced modular cable and controller with resolution of driveline communication fault signal. CT C/A/P c/w prior imaging w/ similar appearance of mild circumferential thickening of the fat plane surrounding the drive line in the upper abdomen extending from the pump to the skin which may be chronic or represent an infectious process. No drainable fluid collection. Wound + BCx collected.  Started on empiric daptomycin/Unasyn per ID. Initial wound Cx + for Pseudomonas Aeruginosa (pan-sensitive) and S. aureus. Cefepime added. Now on Dapto, Cefepime and minocycline. BCx NGTD. Taken to OR 7/31 for wound debridement + application of wound vac - continue abx per ID. Appreciate their assistance.  - OR this week for wound wash out / vac change.   2. Suspected DL fracture with multiple recent falls - Has had multiple falls during the last 2 months, one direct trauma to mid sternum (fell on cinder block) - CT chest/abd/pelvis per above  - CXR w/ LVAD partially imaged. The associated lead noted to be intact w/ no fracture or kink  - plan to send imaging to [facility] - LVAD coordinators to changed modular cable and controller - Not candidate for VAD exchange with recurrent drug  abuse  3.  Chronic systolic CHF: Echo 10/21 with EF 20-25%, mildly decreased RV function. LHC/RHC in 12/21 with patent grafts, low output. Suspect mixed ischemic/nonischemic cardiomyopathy (prior heavy ETOH and drugs as well as CAD). Admitted with cardiogenic shock in 12/21, had placement of Impella 5.5 initially, now s/p Heartmate 3 LVAD on 11/17/20.  Ramp echo 2/23 with speed decreased to 5500 rpm. Speed now back to 5600 rpm.   - He is not volume overloaded on exam.      - Continue current Toprol XL 25 mg daily.  - He does not need a diuretic.   - INR 1.3 today; continue hep gtt  - Euvolemic on exam. No alarms.  - start amlodipine 5mg  daily. MAP >90s today.  4. CKD stage 3: Prior CTA abdomen/pelvis with high-grade stenosis of proximal right renal artery.  - Would consider eventual reassessment of renal artery stenosis and possible treatment  - SCr stable   5.  CAD: S/p CABG 12/20.  LHC pre-VAD with patent grafts, no target for intervention. - Continue Crestor 20 mg daily.   6.  Atrial fibrillation: Paroxysmal.  S/p Maze and LA appendage clip with CABG in 12/20. Maintaining SR.  - Continue Toprol XL 25 mg daily.   - Off amiodarone with hyperthyroidism.  7. Type 2 diabetes: improved control, Hgb A1c down from 12.1>>6.3  - continue SSI   8. 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 He says last use of methamphetamine was 1 month ago.  - USD this admit negative (however collected > 48 hrs post admit)   9. 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  Followed by endocrinology. He is now off amiodarone. He had been on methimazole but has not been taking recently. TSH was high on admit and free T4 normal  - Will NOT restart methimazole   10. Anemia (chronic blood loss):  Hgb stable, 11.6. No gross bleeding.  - follow CBP   11. COPD: Prior smoker.  - Continue Trelegy.   12. PVCs/NSVT:  - Now off amiodarone.   13. Depression: Has turned to substance abuse to cope.   - Has been referred to OP psych in [place]  - Consult TOC while IP to assist with substance abuse   14.  Gout: Continue allopurinol, he has prn colchicine.   15. HTN: MAPs elevated. In setting of known Rt RAS.  - monitor closely, if remains elevated may need reassessment of renal artery stenosis and possible treatment - starting amlodipine 5mg ; reviewed CT abdomen from this admission. Significant calcifications in both renal arteries.   I reviewed the LVAD parameters from today, and compared the results to the patient's prior recorded data.  No programming changes were made.  The LVAD is functioning within specified parameters.  The patient performs LVAD self-test daily.  LVAD interrogation was negative for any significant power changes, alarms or PI events/speed drops.  LVAD equipment check completed and is in good working order.  Back-up equipment present.   LVAD education done on emergency procedures and precautions and reviewed exit site care.  Length of Stay: 6   , DO 07/13/2023, 10:46 AM  VAD Team --- VAD ISSUES ONLY--- Pager [phone] (7am - 7am)  Advanced Heart Failure Team  Pager  [phone] (M-F; 7a - 5p)  Please contact [facility] for night-coverage after hours (5p -7a ) and weekends on [email or web address]

## 2023-07-13 NOTE — Plan of Care (Signed)
  Problem: Education: Goal: Patient will understand all VAD equipment and how it functions Outcome: Progressing Goal: Patient will be able to verbalize current INR target range and antiplatelet therapy for discharge home Outcome: Progressing   Problem: Cardiac: Goal: LVAD will function as expected and patient will experience no clinical alarms Outcome: Progressing   Problem: Education: Goal: Knowledge of General Education information will improve Description: Including pain rating scale, medication(s)/side effects and non-pharmacologic comfort measures Outcome: Progressing   Problem: Health Behavior/Discharge Planning: Goal: Ability to manage health-related needs will improve Outcome: Progressing   Problem: Education: Goal: Knowledge of General Education information will improve Description: Including pain rating scale, medication(s)/side effects and non-pharmacologic comfort measures Outcome: Progressing   Problem: Clinical Measurements: Goal: Ability to maintain clinical measurements within normal limits will improve Outcome: Progressing Goal: Will remain free from infection Outcome: Progressing Goal: Diagnostic test results will improve Outcome: Progressing Goal: Respiratory complications will improve Outcome: Progressing Goal: Cardiovascular complication will be avoided Outcome: Progressing   Problem: Activity: Goal: Risk for activity intolerance will decrease Outcome: Progressing   Problem: Nutrition: Goal: Adequate nutrition will be maintained Outcome: Progressing   Problem: Coping: Goal: Level of anxiety will decrease Outcome: Progressing   Problem: Elimination: Goal: Will not experience complications related to bowel motility Outcome: Progressing Goal: Will not experience complications related to urinary retention Outcome: Progressing   Problem: Pain Managment: Goal: General experience of comfort will improve Outcome: Progressing   Problem:  Safety: Goal: Ability to remain free from injury will improve Outcome: Progressing   Problem: Education: Goal: Ability to describe self-care measures that may prevent or decrease complications (Diabetes Survival Skills Education) will improve Outcome: Progressing Goal: Individualized Educational Video(s) Outcome: Progressing   Problem: Coping: Goal: Ability to adjust to condition or change in health will improve Outcome: Progressing   Problem: Fluid Volume: Goal: Ability to maintain a balanced intake and output will improve Outcome: Progressing   Problem: Health Behavior/Discharge Planning: Goal: Ability to identify and utilize available resources and services will improve Outcome: Progressing Goal: Ability to manage health-related needs will improve Outcome: Progressing   Problem: Metabolic: Goal: Ability to maintain appropriate glucose levels will improve Outcome: Progressing   Problem: Nutritional: Goal: Maintenance of adequate nutrition will improve Outcome: Progressing Goal: Progress toward achieving an optimal weight will improve Outcome: Progressing

## 2023-07-14 DIAGNOSIS — T827XXA Infection and inflammatory reaction due to other cardiac and vascular devices, implants and grafts, initial encounter: Secondary | ICD-10-CM | POA: Diagnosis not present

## 2023-07-14 LAB — GLUCOSE, CAPILLARY
Glucose-Capillary: 189 mg/dL — ABNORMAL HIGH (ref 70–99)
Glucose-Capillary: 194 mg/dL — ABNORMAL HIGH (ref 70–99)
Glucose-Capillary: 270 mg/dL — ABNORMAL HIGH (ref 70–99)
Glucose-Capillary: 210 mg/dL — ABNORMAL HIGH (ref 70–99)

## 2023-07-14 MED ORDER — CLONAZEPAM 0.25 MG PO TBDP
0.5000 mg | ORAL_TABLET | Freq: Two times a day (BID) | ORAL | Status: DC
  Administered 2023-07-14 – 2023-08-15 (×63): 0.5 mg via ORAL
  Filled 2023-07-14 (×65): qty 2

## 2023-07-14 MED ORDER — WARFARIN SODIUM 5 MG PO TABS
5.0000 mg | ORAL_TABLET | Freq: Once | ORAL | Status: AC
  Administered 2023-07-14: 5 mg via ORAL
  Filled 2023-07-14: qty 1

## 2023-07-14 MED ORDER — AMLODIPINE BESYLATE 10 MG PO TABS
10.0000 mg | ORAL_TABLET | Freq: Every day | ORAL | Status: DC
  Administered 2023-07-14 – 2023-08-15 (×33): 10 mg via ORAL
  Filled 2023-07-14 (×33): qty 1

## 2023-07-14 MED ORDER — GABAPENTIN 300 MG PO CAPS
300.0000 mg | ORAL_CAPSULE | Freq: Two times a day (BID) | ORAL | Status: DC
  Administered 2023-07-14 – 2023-08-11 (×57): 300 mg via ORAL
  Filled 2023-07-14 (×58): qty 1

## 2023-07-14 NOTE — Progress Notes (Addendum)
Advanced Heart Failure VAD Team Note  PCP-Cardiologist: Nona Dell, MD  [facility]: Dr. Shirlee Latch   Subjective:    7/29: admitted w/ suspected DL fracture and DL infection. P/w "driveline communication fault" error message on LVAD. No pump stoppages. LVAD coordinator replaced modular cable and controller with resolution of driveline communication fault signal. CT C/A/P c/w prior imaging w/ similar appearance of mild circumferential thickening of the fat plane surrounding the drive line in the upper abdomen extending from the pump to the skin which may be chronic or represent an infectious process. No drainable fluid collection. Wound + BCx collected. Started on empiric daptomycin/Unasyn per ID.  7/30: Wound Cx growing Pseudomonas Aeruginosa (pan-sensitive) and S. aureus. Cefepime added 7/31: OR for wound debridement + application of wound vac.    Remains on Dapto, Cefepime and minocycline. Surgical wound with pseudomonas aeruginosa. Bcx from 7/29 NGTD. AF. WBC WNL.   MAPS remain elevated (100s). Amlodipine added yesterday, denies dizziness. INR 1.3 on hep gtt.   Walking to restroom this morning. Feeling fine otherwise.   LVAD INTERROGATION:  HeartMate III LVAD:   Flow 5.7 liters/min, speed 6100, power 5, PI 3.4. 23 PI events today    Objective:    Vital Signs:   Temp:  [97.6 F (36.4 C)-98.9 F (37.2 C)] 97.9 F (36.6 C) (08/05 0727) Pulse Rate:  [76-93] 89 (08/05 0727) Resp:  [16-21] 19 (08/05 0727) BP: (103-124)/(83-91) 103/89 (08/05 0727) SpO2:  [95 %-97 %] 97 % (08/05 0727) Weight:  [89.8 kg] 89.8 kg (08/05 0606) Last BM Date : 07/13/23 Mean arterial Pressure 90s   Intake/Output:   Intake/Output Summary (Last 24 hours) at 07/14/2023 0737 Last data filed at 07/14/2023 0606 Gross per 24 hour  Intake 1197.75 ml  Output 2400 ml  Net -1202.25 ml     Physical Exam  General:  Well appearing. No resp difficulty HEENT: Normal Neck: supple. JVP ~6. Carotids 2+ bilat; no bruits.  No lymphadenopathy or thyromegaly appreciated. Cor: Mechanical heart sounds with LVAD hum present. Lungs: Clear Abdomen: soft, nontender, nondistended. No hepatosplenomegaly. No bruits or masses. Good bowel sounds. Large protruding hernia Driveline: C/D/I; securement device intact. Dressing around DL exit site.  Extremities: no cyanosis, clubbing, rash, edema Neuro: alert & orientedx3, cranial nerves grossly intact. moves all 4 extremities w/o difficulty. Affect pleasant   Telemetry   NSR 80s (Personally reviewed)    EKG    N/A   Labs   Basic Metabolic Panel: Recent Labs  Lab 07/07/23 1436 07/08/23 0753 07/10/23 0306 07/11/23 0111 07/12/23 0046 07/13/23 0114 07/14/23 0532  NA 136   < > 131* 137 135 135 136  K 3.7   < > 3.7 3.6 3.8 3.7 4.3  CL 103   < > 98 100 99 101 104  CO2 23   < > 21* 24 24 22 23   GLUCOSE 200*   < > 305* 161* 227* 166* 200*  BUN 24*   < > 22 19 26* 27* 25*  CREATININE 1.25*   < > 1.20 1.04 1.13 1.16 1.13  CALCIUM 8.6*   < > 8.8* 8.9 8.8* 8.7* 9.0  MG 1.6*  --   --   --   --   --   --    < > = values in this interval not displayed.    Liver Function Tests: Recent Labs  Lab 07/07/23 1436  AST 17  ALT 24  ALKPHOS 93  BILITOT 0.3  PROT 6.0*  ALBUMIN 3.0*  CBC: Recent Labs  Lab 07/09/23 0258 07/10/23 0306 07/11/23 0111 07/12/23 0046 07/14/23 0532  WBC 6.8 11.2* 9.9 8.4 9.5  HGB 11.6* 11.0* 11.6* 10.9* 11.8*  HCT 34.6* 32.5* 34.9* 33.3* 36.1*  MCV 82.2 83.5 84.7 85.6 83.8  PLT 187 215 213 202 219    INR: Recent Labs  Lab 07/10/23 0306 07/11/23 0111 07/12/23 0046 07/13/23 0120 07/14/23 0532  INR 1.2 1.4* 1.4* 1.3* 1.3*    Other results: EKG:    Imaging   No results found.   Medications:     Scheduled Medications:  allopurinol  100 mg Oral Daily   amLODipine  5 mg Oral Daily   clonazepam  0.25 mg Oral BID   fluticasone furoate-vilanterol  1 puff Inhalation Daily   And   umeclidinium bromide  1 puff Inhalation  Daily   insulin aspart  0-15 Units Subcutaneous TID WC   insulin aspart  0-5 Units Subcutaneous QHS   metoprolol succinate  25 mg Oral Daily   minocycline  200 mg Oral BID   mirtazapine  15 mg Oral QHS   pantoprazole  40 mg Oral Daily   sertraline  50 mg Oral Daily   tamsulosin  0.4 mg Oral Daily   thiamine  100 mg Oral Daily   Warfarin - Pharmacist Dosing Inpatient   Does not apply q1600   zinc sulfate  220 mg Oral Daily    Infusions:  ceFEPime (MAXIPIME) IV 2 g (07/14/23 0622)   DAPTOmycin (CUBICIN) 800 mg in sodium chloride 0.9 % IVPB Stopped (07/13/23 1649)   heparin 500 Units/hr (07/14/23 0602)    PRN Medications: acetaminophen, fluticasone   Patient Profile   62 y.o. 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 type 2 diabetes mellitus, COPD, and hypertension. Now s/p Heartmate 3 LVAD. Post implant course c/b repeated DL infections. Now admitted w/ suspected DL fracture and DL infection. P/w "driveline communication fault" error message on LVAD. No pump stoppages. Also w/ significant greenish drainage from DL, in setting of poor compliance w/ suppressive minocycline. Recent fall ~2 wks prior, likely cause of DL trauma.    Assessment/Plan:   1.  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  Cultures have grown acinetobacter and MRSA. He was readmitted with driveline infection in 2/24, completed course of daptomycin for MRSA. DL site with new green/yellow drainage. Now readmitted w/ suspected DL fracture and DL infection. P/w "driveline communication fault" error message on LVAD. No pump stoppages. Also w/ significant greenish drainage from DL, in setting of poor compliance w/ suppressive minocycline. Recent fall ~2 wks prior,  likely cause of DL trauma. LVAD coordinator replaced modular cable and controller with resolution of driveline communication fault signal. CT C/A/P c/w prior imaging w/ similar appearance of mild circumferential thickening of the fat plane surrounding the drive line in the upper abdomen extending from the pump to the skin which may be chronic or represent an infectious process. No drainable fluid collection. Wound + BCx collected. Started on empiric daptomycin/Unasyn per ID. Initial wound Cx + for Pseudomonas Aeruginosa (pan-sensitive) and S. aureus. Cefepime added. Now on Dapto, Cefepime and minocycline. BCx NGTD. Taken to OR 7/31 for wound debridement + application of wound vac - continue abx per ID. Appreciate their assistance.  -  Back to OR 8/7 for wound wash out / vac change.   2. Suspected DL fracture with multiple recent falls - Has had multiple falls during the last 2 months, one direct trauma to mid sternum (fell on cinder block) - CT chest/abd/pelvis per above  - CXR w/ LVAD partially imaged. The associated lead noted to be intact w/ no fracture or kink  - plan to send imaging to [facility] - LVAD coordinators to changed modular cable and controller - Not candidate for VAD exchange with recurrent drug abuse  3.  Chronic systolic CHF: Echo 10/21 with EF 20-25%, mildly decreased RV function. LHC/RHC in 12/21 with patent grafts, low output. Suspect mixed ischemic/nonischemic cardiomyopathy (prior heavy ETOH and drugs as well as CAD). Admitted with cardiogenic shock in 12/21, had placement of Impella 5.5 initially, now s/p Heartmate 3 LVAD on 11/17/20.  Ramp echo 2/23 with speed decreased to 5500 rpm. Speed now back to 5600 rpm.   - He is not volume overloaded on exam.      - Continue current Toprol XL 25 mg daily.  - He does not need a diuretic.   - INR 1.3 today; continue hep gtt  - Euvolemic on exam. No alarms.  - increase amlodipine 5>10mg  daily. MAP 100s today.  4. CKD stage 3: Prior CTA  abdomen/pelvis with high-grade stenosis of proximal right renal artery.  - Would consider eventual reassessment of renal artery stenosis and possible treatment  - SCr stable   5.  CAD: S/p CABG 12/20.  LHC pre-VAD with patent grafts, no target for intervention. - Continue Crestor 20 mg daily.   6.  Atrial fibrillation: Paroxysmal.  S/p Maze and LA appendage clip with CABG in 12/20. Maintaining SR.  - Continue Toprol XL 25 mg daily.  - Off amiodarone with hyperthyroidism.  7. Type 2 diabetes: improved control, Hgb A1c down from 12.1>>6.3  - continue SSI   8. 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 He says last use of methamphetamine was 1 month ago.  - USD this admit negative (however collected > 48 hrs post admit)   9. 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  Followed by endocrinology. He is now off amiodarone. He had been on methimazole but has not been taking recently. TSH was high on admit and free T4 normal  - Will NOT restart methimazole   10. Anemia (chronic blood loss):  Hgb stable. No gross bleeding.  - follow CBP   11. COPD: Prior smoker.  - Continue Trelegy.   12. PVCs/NSVT:  - Now off amiodarone.   13. Depression: Has turned to substance abuse to cope.   - Has been referred to OP psych in [place]  - Consult TOC while IP to assist with substance abuse   14.  Gout: Continue allopurinol, he has prn colchicine.   15. HTN: MAPs elevated. In setting of known Rt RAS.  - monitor closely, if remains elevated may need reassessment of renal artery stenosis and possible treatment - Increase amlodipine 5>10 mg. DO reviewed CT abdomen from this admission. Significant calcifications in both renal arteries.   I reviewed the LVAD parameters from today, and compared the results to the patient's prior recorded data.  No programming changes were made.  The LVAD is functioning within specified parameters.  The patient performs LVAD self-test daily.  LVAD  interrogation was negative for any significant power changes, alarms or PI events/speed drops.  LVAD equipment check completed and is in good working order.  Back-up equipment present.   LVAD education done on emergency procedures and precautions and reviewed exit site care.  Length of Stay: 7  Alen Bleacher, NP 07/14/2023, 7:37 AM  VAD Team --- VAD ISSUES ONLY--- Pager [phone] (7am - 7am)  Advanced Heart Failure Team  Pager [phone] (M-F; 7a - 5p)  Please contact [facility] for night-coverage after hours (5p -7a ) and weekends on [email or web address]  Patient seen and examined with the above-signed Advanced Practice Provider and/or Housestaff. I personally reviewed laboratory data, imaging studies and relevant notes. I independently examined the patient and formulated the important aspects of the plan. I have edited the note to reflect any of my changes or salient points. I have personally discussed the plan with the patient and/or family.  Remains on IV abx. Denies fevers or chills. Wound vac working well. Still with periods of breakthrough agitation.   General:  Lying in bed  HEENT: normal  Neck: supple. JVP not elevated.  Carotids 2+ bilat; no bruits. No lymphadenopathy or thryomegaly appreciated. Cor: LVAD hum.  Lungs: Clear. Abdomen: obese soft, nontender, No hepatosplenomegaly. No bruits or masses. Good bowel sounds. Wound vac in place. Huge incisional hernia which is completely reducible Extremities: no cyanosis, clubbing, rash. Warm no edema  Neuro: alert & oriented x 3. No focal deficits. Moves all 4 without problem   Continue IV abx and wound vac. Back to OR Wednesday.   Off warfarin. On heparin gtt. INR 1.3. Discussed dosing with PharmD personally.  Will place ab binder for incisional hernia.   Increased klonopin for agitation.   VAD interrogated personally. Parameters stable.  Arvilla Meres, MD  3:11 PM

## 2023-07-14 NOTE — Progress Notes (Signed)
ANTICOAGULATION CONSULT NOTE  Pharmacy Consult for warfarin + fixed-dose heparin Indication:  LVAD  Allergies  Allergen Reactions   Vancomycin Nausea And Vomiting and Other (See Comments)    Severe joint pain    Patient Measurements: Height: 5\' 10"  (177.8 cm) Weight: 89.8 kg (197 lb 14.4 oz) IBW/kg (Calculated) : 73 Heparin Dosing Weight: 91.4 kg   Vital Signs: Temp: 97.9 F (36.6 C) (08/05 0727) Temp Source: Oral (08/05 0727) BP: 117/95 (08/05 0912) Pulse Rate: 85 (08/05 0912)  Labs: Recent Labs    07/12/23 0046 07/13/23 0114 07/13/23 0120 07/14/23 0532  HGB 10.9*  --   --  11.8*  HCT 33.3*  --   --  36.1*  PLT 202  --   --  219  LABPROT 17.5*  --  16.6* 16.3*  INR 1.4*  --  1.3* 1.3*  HEPARINUNFRC <0.10*  --  <0.10* 0.10*  CREATININE 1.13 1.16  --  1.13    Estimated Creatinine Clearance: 76.4 mL/min (by C-G formula based on SCr of 1.13 mg/dL).   Medical History: Past Medical History:  Diagnosis Date   "    Arthritis    CAD (coronary artery disease)    a. s/p CABG in 11/2019 with LIMA-LAD, SVG-OM1, SVG-PDA and SVG-D1   CHF (congestive heart failure) (HCC)    a. EF < 20% by echo in 11/2019   Essential hypertension    PAF (paroxysmal atrial fibrillation) (HCC)    Type 2 diabetes mellitus (HCC)     Medications:  Scheduled:   allopurinol  100 mg Oral Daily   amLODipine  10 mg Oral Daily   clonazepam  0.25 mg Oral BID   fluticasone furoate-vilanterol  1 puff Inhalation Daily   And   umeclidinium bromide  1 puff Inhalation Daily   insulin aspart  0-15 Units Subcutaneous TID WC   insulin aspart  0-5 Units Subcutaneous QHS   metoprolol succinate  25 mg Oral Daily   minocycline  200 mg Oral BID   mirtazapine  15 mg Oral QHS   pantoprazole  40 mg Oral Daily   sertraline  50 mg Oral Daily   tamsulosin  0.4 mg Oral Daily   thiamine  100 mg Oral Daily   Warfarin - Pharmacist Dosing Inpatient   Does not apply q1600   zinc sulfate  220 mg Oral Daily     Assessment: 62 yom who presented to clinic with concern for driveline fracture and infection. Last [facility] appointment was on 04/21/2023 - INR was subtherapeutic at 1.4 and regimen at that time was 6 mg daily except 4 mg Tues/Thurs (LD 7/25).  Given low INR on admit, started fixed rate heparin 500 units/hr until INR >1.8. Heparin level 0.1, CBC and LDH stable, INR remains low at 0.3. Ongoing debridements of DLI planned this admission.  Goal of Therapy:  Heparin level <0.3 units/ml INR 2-2.5  - keeping lower INR goal ~1.5 given plan for debridement upcoming week Monitor platelets by anticoagulation protocol: Yes   Plan:  Warfarin 5mg  x1 tonight   Continue heparin infusion at 500 units/hr >> (no up titrations unless discussed with MD) Monitor daily heparin level, INR, CBC, LDH, and for s/sx of bleeding   [name], PharmD, BCPS, [facility] Clinical Pharmacist [phone] Please check AMION for all [facility] Pharmacy numbers 07/14/2023

## 2023-07-14 NOTE — Inpatient Diabetes Management (Signed)
Inpatient Diabetes Program Recommendations  AACE/ADA: New Consensus Statement on Inpatient Glycemic Control (2015)  Target Ranges:  Prepandial:   less than 140 mg/dL      Peak postprandial:   less than 180 mg/dL (1-2 hours)      Critically ill patients:  140 - 180 mg/dL    Latest Reference Range & Units 07/13/23 06:15 07/13/23 11:04 07/13/23 16:42 07/13/23 21:12  Glucose-Capillary 70 - 99 mg/dL 161 (H)  3 units Novolog  244 (H)  5 units Novolog  310 (H)  11 units Novolog  257 (H)  3 units Novolog   (H): Data is abnormally high  Latest Reference Range & Units 07/14/23 06:08  Glucose-Capillary 70 - 99 mg/dL 096 (H)  3 units Novolog   (H): Data is abnormally high     Home DM Meds: Glipizide 5 mg Daily       Metformin 500 mg BID  Current Orders: Novolog Moderate Correction Scale/ SSI (0-15 units) TID AC + HS    MD- Home oral DM meds are on hold.  Afternoon CBGs elevated. Please consider:   Start Novolog Meal Coverage: Novolog 4 units TID with meals HOLD if pt NPO HOLD if pt eats <50% meals    --Will follow patient during hospitalization--  Ambrose Finland RN, MSN, CDCES Diabetes Coordinator Inpatient Glycemic Control Team Team Pager: [phone] (8a-5p)

## 2023-07-14 NOTE — TOC Progression Note (Signed)
Transition of Care (TOC) - Progression Note    Patient Details  Name: Kevin Underwood. MRN: [id number] Date of Birth: May 12, 1961  Transition of Care [facility]) CM/SW Contact  [name], RN Phone Number: [phone] 07/14/2023, 1:30 PM  Clinical Narrative:   HF TOC CM spoke to pt at home and states he does not want to go back to SNF. Pt lives at home with wife, but he states he is independent at home. Will continue to follow for dc needs. Possible plan for SNF for IV abx.     Expected Discharge Plan: Skilled Nursing Facility Barriers to Discharge: Continued Medical Work up  Expected Discharge Plan and Services   Discharge Planning Services: CM Consult Post Acute Care Choice: Skilled Nursing Facility Living arrangements for the past 2 months: Single Family Home                                       Social Determinants of Health (SDOH) Interventions SDOH Screenings   Food Insecurity: No Food Insecurity (07/08/2023)  Housing: Low Risk  (07/08/2023)  Transportation Needs: No Transportation Needs (07/08/2023)  Utilities: Not At Risk (07/08/2023)  Alcohol Screen: Low Risk  (11/10/2020)  Depression (PHQ2-9): Low Risk  (10/16/2022)  Financial Resource Strain: Medium Risk (05/17/2022)  Physical Activity: Inactive (11/10/2020)  Tobacco Use: Medium Risk (07/09/2023)    Readmission Risk Interventions    07/01/2022    9:05 AM 11/28/2020   11:57 AM  Readmission Risk Prevention Plan  Transportation Screening Complete Complete  Medication Review Oceanographer) Complete Complete  PCP or Specialist appointment within 3-5 days of discharge Complete   HRI or Home Care Consult Complete Complete  SW Recovery Care/Counseling Consult Complete Complete  Palliative Care Screening Not Applicable Not Applicable  Skilled Nursing Facility Not Applicable Not Applicable

## 2023-07-14 NOTE — Progress Notes (Signed)
[facility]    Date of Admission:  07/07/2023   Total days of antibiotics 8        RFC: polymicrobial LVAD DLI  ID: Kevin Underwood. is a 62 y.o. male with   Principal Problem:   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 (LVAD) (HCC)    Subjective: Patient was admitted on 7/29 from VAD clinic with suspected worsening of DLI and possible drive line fracture. He has been on minocycline for chronic suppression for prior acinetobacter/MRSA infection, has intolerance to vancomycin was having greenish drainage . CT imaging on 7/29 showed thickening of fat plane about drive line extending from the pump to the skin. No drainable fluid. He was been to the OR on 7/31 for debridement and wound vac. Which was subsequently changed to wet to dry dressing changes on 8/2. Has upcoming debridement on 07/16/2023  He remains afebrile. Denies alarms going off. Has tolerated abtx.  Has really only been seen by ID service when he is hospitalized.   Microbiology cx data:07/09/23 OR culture- PSA and MRSA  Susceptibility   Pseudomonas aeruginosa Methicillin resistant staphylococcus aureus    MIC MIC    CEFEPIME 2 SENSITIVE Sensitive      CEFTAZIDIME 4 SENSITIVE Sensitive      CIPROFLOXACIN 0.5 SENSITIVE Sensitive 4 RESISTANT Resistant    CLINDAMYCIN   <=0.25 SENS... Sensitive    ERYTHROMYCIN   >=8 RESISTANT Resistant    GENTAMICIN <=1 SENSITIVE Sensitive <=0.5 SENSI... Sensitive    IMIPENEM 1 SENSITIVE Sensitive      Inducible Clindamycin   NEGATIVE Sensitive    LINEZOLID   2 SENSITIVE Sensitive    OXACILLIN   >=4 RESISTANT Resistant    PIP/TAZO 16 SENSITIVE Sensitive      RIFAMPIN   <=0.5 SENSI... Sensitive    TETRACYCLINE   <=1 SENSITIVE Sensitive    TRIMETH/SULFA   >=320 RESIS... Resistant    VANCOMYCIN   <=0.5 SENSI... Sensitive        07/07/23 wound cx: PSA and MRSA  Susceptibility   Pseudomonas aeruginosa Methicillin resistant  staphylococcus aureus    MIC MIC    CEFEPIME 2 SENSITIVE Sensitive      CEFTAZIDIME 4 SENSITIVE Sensitive      CIPROFLOXACIN <=0.25 SENS... Sensitive >=8 RESISTANT Resistant    CLINDAMYCIN   <=0.25 SENS... Sensitive    ERYTHROMYCIN   <=0.25 SENS... Sensitive    GENTAMICIN 2 SENSITIVE Sensitive <=0.5 SENSI... Sensitive    IMIPENEM 1 SENSITIVE Sensitive      Inducible Clindamycin   NEGATIVE Sensitive    LINEZOLID   2 SENSITIVE Sensitive    OXACILLIN   >=4 RESISTANT Resistant    PIP/TAZO 8 SENSITIVE Sensitive      RIFAMPIN   <=0.5 SENSI... Sensitive    TETRACYCLINE   <=1 SENSITIVE Sensitive    TRIMETH/SULFA   160 RESISTANT Resistant    VANCOMYCIN   1 SENSITIVE Sensitive         01/28/23:abd wound- MRsA ( FQ R, OXa R, sulfa R) vanco MIC 1, tetra S)  09/30/22         Acinetobacter calcoaceticus/baumannii complex Methicillin resistant staphylococcus aureus      MIC MIC    AMPICILLIN/SULBACTAM <=2 SENSITIVE Sensitive      CEFTAZIDIME 16 INTERMED... Intermediate      CIPROFLOXACIN 0.5 SENSITIVE Sensitive >=8 RESISTANT Resistant    CLINDAMYCIN   <=0.25 SENS... Sensitive    ERYTHROMYCIN   >=8  RESISTANT Resistant    GENTAMICIN <=1 SENSITIVE Sensitive <=0.5 SENSI... Sensitive    IMIPENEM <=0.25 SENS... Sensitive      Inducible Clindamycin   NEGATIVE Sensitive    OXACILLIN   >=4 RESISTANT Resistant    PIP/TAZO >=128 RESIS... Resistant      RIFAMPIN   <=0.5 SENSI... Sensitive    TETRACYCLINE   <=1 SENSITIVE Sensitive    TRIMETH/SULFA <=20 SENSIT... Sensitive >=320 RESIS... Resistant    VANCOMYCIN   <=0.5 SENSI... Sensitive   05/16/22 -abd cx- MRSA( oxa R, FQ R, sulfa R, Sensitive to tetra and vanco)   Medications:   allopurinol  100 mg Oral Daily   amLODipine  10 mg Oral Daily   clonazepam  0.5 mg Oral BID   fluticasone furoate-vilanterol  1 puff Inhalation Daily   And   umeclidinium bromide  1 puff Inhalation Daily   gabapentin  300 mg Oral BID   insulin aspart  0-15 Units  Subcutaneous TID WC   insulin aspart  0-5 Units Subcutaneous QHS   metoprolol succinate  25 mg Oral Daily   minocycline  200 mg Oral BID   mirtazapine  15 mg Oral QHS   pantoprazole  40 mg Oral Daily   sertraline  50 mg Oral Daily   tamsulosin  0.4 mg Oral Daily   thiamine  100 mg Oral Daily   warfarin  5 mg Oral ONCE-1600   Warfarin - Pharmacist Dosing Inpatient   Does not apply q1600   zinc sulfate  220 mg Oral Daily    Objective: Vital signs in last 24 hours: Temp:  [97.6 F (36.4 C)-99 F (37.2 C)] 99 F (37.2 C) (08/05 1113) Pulse Rate:  [76-93] 85 (08/05 0912) Resp:  [16-21] 16 (08/05 1113) BP: (103-124)/(83-95) 118/92 (08/05 1113) SpO2:  [95 %-97 %] 96 % (08/05 0821) Weight:  [89.8 kg] 89.8 kg (08/05 0606)  Physical Exam  Constitutional: He is oriented to person, place, and time. He appears well-developed and well-nourished. No distress.  HENT:  Mouth/Throat: Oropharynx is clear and moist. No oropharyngeal exudate.  Cardiovascular: generator hum heard throughout Pulmonary/Chest: Effort normal and breath sounds normal. No respiratory distress. He has no wheezes.  Abdominal: Soft. Bowel sounds are normal. He exhibits no distension. There is no tenderness. +bandage drive line site Lymphadenopathy:  He has no cervical adenopathy.  Neurological: He is alert and oriented to person, place, and time.  Skin: Skin is warm and dry. No rash noted. No erythema.  Psychiatric: He has a normal mood and affect. His behavior is normal.    Lab Results Recent Labs    07/12/23 0046 07/13/23 0114 07/14/23 0532  WBC 8.4  --  9.5  HGB 10.9*  --  11.8*  HCT 33.3*  --  36.1*  NA 135 135 136  K 3.8 3.7 4.3  CL 99 101 104  CO2 24 22 23   BUN 26* 27* 25*  CREATININE 1.13 1.16 1.13     Microbiology: see above  Studies/Results: No results found.   Assessment/Plan: Polymicrobial DLI (MRSA nad PsA) = continue with daptomycin and cefepime for the time being. For upcoming  debridement on 8/7, please resent deep tissue cultures. We are checking on Dapto sensitivity on the current MRSA isolate ( this has been at least 14 months in looking back on historic data) - may consider to do 4 wk of daptomycin then transition to Q 3 wk dalbavancin if we think he can reliably receive abtx  Hx of  acinetobacter driveline infection = continue on minocycline.  I have personally spent 50 minutes involved in face-to-face and non-face-to-face activities for this patient on the day of the visit. Professional time spent includes the following activities: Preparing to see the patient (review of tests), Obtaining and/or reviewing separately obtained history (admission/discharge record), Performing a medically appropriate examination and/or evaluation , Ordering medications/tests/procedures, referring and communicating with other health care professionals, Documenting clinical information in the EMR, Independently interpreting results (not separately reported), Communicating results to the patient/family/caregiver, Counseling and educating the patient/family/caregiver and Care coordination (not separately reported).     [facility] Infectious Diseases Pager: [phone]  07/14/2023, 1:22 PM

## 2023-07-14 NOTE — Progress Notes (Signed)
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 Started on IV antibiotics.   Patient presented to VAD clinic 7/29 for driveline communication fault- stating that he has had "alarms every couple hrs from his VAD for a couple months." Pt has no showed and cancelled all appointments in the past 2 months. Pt also had green/blue drainage visible through his driveline bandage. (Dressing change and wound cx obtained). Pt states that he has experienced dizziness associated with a fall multiple times over the past couple months but no signs of bleeding. Controller and modular cable exchanged. No further communication fault alarms noted.   CT Chest/Abd/Pelvis 7/29 1. Similar appearance of mild circumferential thickening of the fat plane surrounding the drive line in the upper abdomen extending from the pump to the skin which may be chronic or represent an infectious process. No drainable fluid collection.  Pt had drive line debridement in OR with placement of instillation wound vac on 07/09/23 with Dr Donata Clay. This was change to wet/dry on Friday 8/2 due to ongoing alarms related to the pts hernia.  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 Currently on IV Cefepime Q 8 hrs and IV Daptomycin 800 mg daily per ID team.   Pt laying in bed. Denies complaints. Pt not wearing abdominal binder. Wound care performed - see below.  Vital signs: Temp: 99 HR: 85 Doppler Pressure: 100 Automatic BP: 103/89 (97) O2 Sat: 98% on RA Wt: 193.3>194.8>196.4>194.7>197.9 lbs  LVAD interrogation reveals:  Speed: 5600 Flow: 4.1 Power: 4.1 w PI: 4.1 Alarms: none Events: 20 daily PI events Hct: 36  Fixed speed:  5600 Low speed limit:  5300  Drive Line:  Existing VAD dressing removed and site care performed using sterile technique. Wound bed cleansed with VASHE solution and sterile 4x4 gauze  through light manual debridement. Skin surrounding wound bed cleaned with Chlora prep applicators x 2, allowed to dry. 1 VASHE moistened 4 x 4s placed in wound bed, covered with several dry 4 x 4s. Exit site tunnels approximately 4cm. Drive line unincorporated. Small amount of serosanguinous/yellow drainage noted. No redness, tenderness, foul odor or rash noted. Drive line anchor secured. Continue daily wet to dry dressing changes using VASHE solution per bedside RN or VAD coordinator. Next dressing change due 07/15/23 by bedside nurse.     Labs:  LDH trend: 121>133>130>138>172  INR trend: 1.0>1.1>1.2>1.4>1.3  Anticoagulation Plan: -INR Goal: 2.0 - 2.5 -ASA Dose: none  ICD: N/A  Infection:  07/07/23>> blood cultures>> no growth 2 days; final pending 07/07/23>> wound cx>> few pseudomonas aeruginosa; final pending 07/09/23>> OR driveline cx>> Few pseudomonas aeruginosa and METHICILLIN RESISTANT STAPHYLOCOCCUS AUREUS 07/09/23>> blood cultures>>NGTD  Drips:  Heparin 500 units/hr  Plan/Recommendations:  Page VAD coordinator with equipment issues or driveline problems Daily drive line wet to dry dressing changes with VASHE solution.   Carlton Adam RN,BSN VAD Coordinator  Office: [phone]  24/7 Pager: [phone]

## 2023-07-15 DIAGNOSIS — T827XXA Infection and inflammatory reaction due to other cardiac and vascular devices, implants and grafts, initial encounter: Secondary | ICD-10-CM | POA: Diagnosis not present

## 2023-07-15 LAB — GLUCOSE, CAPILLARY
Glucose-Capillary: 196 mg/dL — ABNORMAL HIGH (ref 70–99)
Glucose-Capillary: 155 mg/dL — ABNORMAL HIGH (ref 70–99)
Glucose-Capillary: 300 mg/dL — ABNORMAL HIGH (ref 70–99)
Glucose-Capillary: 239 mg/dL — ABNORMAL HIGH (ref 70–99)

## 2023-07-15 LAB — CK: Total CK: 53 U/L (ref 49–397)

## 2023-07-15 MED ORDER — POTASSIUM CHLORIDE CRYS ER 20 MEQ PO TBCR
20.0000 meq | EXTENDED_RELEASE_TABLET | Freq: Once | ORAL | Status: AC
  Administered 2023-07-15: 20 meq via ORAL
  Filled 2023-07-15: qty 1

## 2023-07-15 MED ORDER — WARFARIN SODIUM 4 MG PO TABS
4.0000 mg | ORAL_TABLET | Freq: Once | ORAL | Status: AC
  Administered 2023-07-15: 4 mg via ORAL
  Filled 2023-07-15: qty 1

## 2023-07-15 MED ORDER — TRAZODONE HCL 50 MG PO TABS
150.0000 mg | ORAL_TABLET | Freq: Every day | ORAL | Status: DC
  Administered 2023-07-15 – 2023-08-14 (×29): 150 mg via ORAL
  Filled 2023-07-15 (×30): qty 3

## 2023-07-15 NOTE — Progress Notes (Addendum)
Advanced Heart Failure VAD Team Note  PCP-Cardiologist: Nona Dell, MD  [facility]: Dr. Shirlee Latch   Subjective:    7/29: admitted w/ suspected DL fracture and DL infection. P/w "driveline communication fault" error message on LVAD. No pump stoppages. LVAD coordinator replaced modular cable and controller with resolution of driveline communication fault signal. CT C/A/P c/w prior imaging w/ similar appearance of mild circumferential thickening of the fat plane surrounding the drive line in the upper abdomen extending from the pump to the skin which may be chronic or represent an infectious process. No drainable fluid collection. Wound + BCx collected. Started on empiric daptomycin/Unasyn per ID.  7/30: Wound Cx growing Pseudomonas Aeruginosa (pan-sensitive) and S. aureus. Cefepime added 7/31: OR for wound debridement + application of wound vac.    Remains on Dapto, Cefepime and minocycline. Surgical wound with pseudomonas aeruginosa and MRSA. Bcx from 7/29 and 7/31 NG. AF. WBC WNL.   INR 1.4. Remains on heparin gtt.  Feeling well. No complaints. Has been ambulating halls.     LVAD INTERROGATION:  HeartMate III LVAD:   Flow 4.1 liters/min, speed 5600, power4, PI 4.3. 6 PI events so far this am  Objective:    Vital Signs:   Temp:  [97.6 F (36.4 C)-99 F (37.2 C)] 97.7 F (36.5 C) (08/06 0719) Pulse Rate:  [85-91] 89 (08/06 0719) Resp:  [15-19] 16 (08/06 0719) BP: (93-133)/(49-101) 125/98 (08/06 0719) SpO2:  [96 %-99 %] 97 % (08/06 0719) Weight:  [90.1 kg] 90.1 kg (08/06 0335) Last BM Date : 07/13/23 Mean arterial Pressure 90s-100s  Intake/Output:   Intake/Output Summary (Last 24 hours) at 07/15/2023 0723 Last data filed at 07/14/2023 1603 Gross per 24 hour  Intake 316.02 ml  Output --  Net 316.02 ml     Physical Exam  Physical Exam: GENERAL: Well appearing. Sitting up in chair. HEENT: normal  NECK: Supple, JVP not elevated.  2+ bilaterally, no bruits.   CARDIAC:   Mechanical heart sounds with LVAD hum present.  LUNGS:  Clear to auscultation bilaterally.  ABDOMEN:  Large incisional hernia LVAD exit site: Some drainage soaked through dressing. Stabilization device present and accurately applied.  Driveline dressing is being changed daily per sterile technique. EXTREMITIES:  Warm and dry, no cyanosis, clubbing, rash or edema  NEUROLOGIC:  Alert and oriented x 4.  Affect pleasant.       Telemetry  SR 80s-90s    EKG    N/A   Labs   Basic Metabolic Panel: Recent Labs  Lab 07/11/23 0111 07/12/23 0046 07/13/23 0114 07/14/23 0532 07/15/23 0527  NA 137 135 135 136 135  K 3.6 3.8 3.7 4.3 3.9  CL 100 99 101 104 101  CO2 24 24 22 23 22   GLUCOSE 161* 227* 166* 200* 190*  BUN 19 26* 27* 25* 22  CREATININE 1.04 1.13 1.16 1.13 1.00  CALCIUM 8.9 8.8* 8.7* 9.0 9.1    Liver Function Tests: No results for input(s): "AST", "ALT", "ALKPHOS", "BILITOT", "PROT", "ALBUMIN" in the last 168 hours.  CBC: Recent Labs  Lab 07/10/23 0306 07/11/23 0111 07/12/23 0046 07/14/23 0532 07/15/23 0527  WBC 11.2* 9.9 8.4 9.5 10.2  HGB 11.0* 11.6* 10.9* 11.8* 11.6*  HCT 32.5* 34.9* 33.3* 36.1* 35.6*  MCV 83.5 84.7 85.6 83.8 84.0  PLT 215 213 202 219 230    INR: Recent Labs  Lab 07/11/23 0111 07/12/23 0046 07/13/23 0120 07/14/23 0532 07/15/23 0527  INR 1.4* 1.4* 1.3* 1.3* 1.4*    Other results:  EKG:    Imaging   No results found.   Medications:     Scheduled Medications:  allopurinol  100 mg Oral Daily   amLODipine  10 mg Oral Daily   clonazepam  0.5 mg Oral BID   fluticasone furoate-vilanterol  1 puff Inhalation Daily   And   umeclidinium bromide  1 puff Inhalation Daily   gabapentin  300 mg Oral BID   insulin aspart  0-15 Units Subcutaneous TID WC   insulin aspart  0-5 Units Subcutaneous QHS   metoprolol succinate  25 mg Oral Daily   minocycline  200 mg Oral BID   mirtazapine  15 mg Oral QHS   pantoprazole  40 mg Oral Daily    sertraline  50 mg Oral Daily   tamsulosin  0.4 mg Oral Daily   thiamine  100 mg Oral Daily   Warfarin - Pharmacist Dosing Inpatient   Does not apply q1600   zinc sulfate  220 mg Oral Daily    Infusions:  ceFEPime (MAXIPIME) IV 2 g (07/15/23 0552)   DAPTOmycin (CUBICIN) 800 mg in sodium chloride 0.9 % IVPB Stopped (07/14/23 1404)   heparin 500 Units/hr (07/14/23 1603)    PRN Medications: acetaminophen, fluticasone   Patient Profile   62 y.o. 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 Now s/p Heartmate 3 LVAD. Now admitted w/ suspected DL fracture after multiple falls and recurrent DL infection.   Assessment/Plan:   1.  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  Cultures have grown acinetobacter and MRSA. He was readmitted with driveline infection in 2/24, completed course of daptomycin for MRSA. DL site with new green/yellow drainage. Now readmitted w/ suspected DL fracture and DL infection. P/w "driveline communication fault" error message on LVAD. No pump stoppages. Also w/ significant greenish drainage from DL, in setting of poor compliance w/ suppressive minocycline. Recent fall ~2 wks prior, likely cause of DL trauma. LVAD coordinator replaced modular cable and controller with resolution of driveline communication fault signal. CT C/A/P c/w prior imaging w/ similar appearance of mild circumferential thickening of the fat plane surrounding the drive line in the upper abdomen extending from the pump to the skin which may be chronic or represent an infectious process. No drainable fluid collection. Wound + BCx collected. Started on empiric daptomycin/Unasyn per ID. Initial wound Cx + for Pseudomonas  Aeruginosa (pan-sensitive) and S. aureus. Cefepime added. Now on Dapto, Cefepime and minocycline. BCx NGTD. Taken to OR 7/31 for wound debridement + application of wound vac - continue abx per ID. Appreciate their assistance. ID considering 4 weeks of daptomycin then Q 3 wks dalbavancin. Would have significant concern about him receiving IV abx at home with history of drug use and noncompliance. Would likely need to discharge to SNF if needs IV abx after discharge but he does not want to return to SNF. - Back to OR 8/7 for wound wash out / vac change.   2. Suspected DL fracture with multiple recent falls - Has had multiple falls during the last 2 months, one direct trauma to mid sternum (fell on cinder block) - CT chest/abd/pelvis per above  - CXR w/ LVAD partially imaged. The associated lead noted to be intact w/ no fracture or  kink  - LVAD coordinators changed modular cable and controller  3.  Chronic systolic CHF: Echo 10/21 with EF 20-25%, mildly decreased RV function. LHC/RHC in 12/21 with patent grafts, low output. Suspect mixed ischemic/nonischemic cardiomyopathy (prior heavy ETOH and drugs as well as CAD). Admitted with cardiogenic shock in 12/21, had placement of Impella 5.5 initially, now s/p Heartmate 3 LVAD on 11/17/20.  Ramp echo 2/23 with speed decreased to 5500 rpm. Speed now back to 5600 rpm.   - He is not volume overloaded on exam.      - Continue current Toprol XL 25 mg daily.  - He does not need a diuretic.   - INR 1.1 on admission. Suspect noncompliant with Warfarin. - INR 1.4 today; continue heparin gtt   4. CKD stage 3: Prior CTA abdomen/pelvis with high-grade stenosis of proximal right renal artery.  - Would consider eventual reassessment of renal artery stenosis and possible treatment  - SCr stable   5.  CAD: S/p CABG 12/20.  LHC pre-VAD with patent grafts, no target for intervention. - Continue Crestor 20 mg daily.   6.  Atrial fibrillation: Paroxysmal.  S/p Maze and  LA appendage clip with CABG in 12/20. Maintaining SR.  - Continue Toprol XL 25 mg daily.  - Off amiodarone with hyperthyroidism.  7. Type 2 diabetes: improved control, Hgb A1c down from 12.1>>6.3  - continue SSI   8. 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 He says last use of methamphetamine was > 2 months ago. - UDS this admit negative (however collected > 48 hrs post admit)  - Declines substance use resources  9. 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  Followed by endocrinology. He is now off amiodarone. He had been on methimazole but has not been taking recently. TSH was high on admit and free T4 normal  - Will NOT restart methimazole   10. Anemia (chronic blood loss):  Hgb stable. No gross bleeding.  - follow CBP   11. COPD: Prior smoker.  - Continue Trelegy.   12. PVCs/NSVT:  - Now off amiodarone.   13. Depression: Has turned to substance abuse to cope.   - Has been referred to OP psych in [place]   14.  Gout: Continue allopurinol, he has prn colchicine.   15. HTN: MAPs elevated. In setting of known Rt RAS. Hold off on ACE/ARB. - monitor closely, if remains elevated may need reassessment of renal artery stenosis and possible treatment. Significant calcifications in both renal arteries on CT abdomen this admit. - Amlodipine started at 10 mg on 08/05 (did not receive on 08/04).  16. Agitation: - Klonopin increased - Still with some intermittent agitation but reports this is improving  17. Incisional hernia: -Needs to wear abdominal binder   Plan to return to OR on 08/07. Appreciate ID assistance. Do not think home IV antibiotics would be feasible for him. He has been noncompliant with outpatient follow-up and medical management. Refuses to go back to SNF at discharge.     I reviewed the LVAD parameters from today, and compared the results to the patient's prior recorded data.  No programming changes were made.  The LVAD is functioning  within specified parameters.  The patient performs LVAD self-test daily.  LVAD interrogation was negative for any significant power changes, alarms or PI events/speed drops.  LVAD equipment check completed and is in good working order.  Back-up equipment present.   LVAD education done on emergency procedures and precautions and reviewed exit  site care.  Length of Stay: 8  FINCH, LINDSAY N, PA-C 07/15/2023, 7:23 AM  VAD Team --- VAD ISSUES ONLY--- Pager [phone] (7am - 7am)  Advanced Heart Failure Team  Pager [phone] (M-F; 7a - 5p)  Please contact [facility] for night-coverage after hours (5p -7a ) and weekends on [email or web address]  Patient seen and examined with the above-signed Advanced Practice Provider and/or Housestaff. I personally reviewed laboratory data, imaging studies and relevant notes. I independently examined the patient and formulated the important aspects of the plan. I have edited the note to reflect any of my changes or salient points. I have personally discussed the plan with the patient and/or family.  Remains on IV abx. Agitation has improved with increased dose of klonopin. On IV heparin. No bleeding  General:  NAD.  HEENT: normal  Neck: supple. JVP not elevated.  Carotids 2+ bilat; no bruits. No lymphadenopathy or thryomegaly appreciated. Cor: LVAD hum.  Lungs: Clear. Abdomen: large incisional hernia. Wound vac in place Extremities: no cyanosis, clubbing, rash. Warm no edema  Neuro: alert & oriented x 3. No focal deficits. Moves all 4 without problem   Continue IV abx. Back to OR on Thursday for wound vac change. INR 1.4 Continue heparin/warfarin. Discussed dosing with PharmD personally. Ambulate.   VAD interrogated personally. Parameters stable.  Arvilla Meres, MD  9:52 AM

## 2023-07-15 NOTE — Progress Notes (Signed)
Pharmacy Antibiotic Note  Kevin Underwood. is a 62 y.o. male admitted on 07/07/2023 with  driveline infection . Pharmacy has been consulted for daptomycin and cefepime dosing. Pt also continued on PTA minocycline for hx acinetobacter DLI.  Cr stable, wound cultures growing Pseudomonas and MRSA, blood cultures negative. CK stable. ID following.  Plan: Daptomycin 800 mg every 24 hours Cefepime 2g IV every 8 hours Minocycline 200 mg BID F/u blood and driveline cx and continue to monitor renal function Monitor CK weekly  Height: 5\' 10"  (177.8 cm) Weight: 90.1 kg (198 lb 10.2 oz) IBW/kg (Calculated) : 73  Temp (24hrs), Avg:98 F (36.7 C), Min:97.6 F (36.4 C), Max:99 F (37.2 C)  Recent Labs  Lab 07/10/23 0306 07/11/23 0111 07/12/23 0046 07/13/23 0114 07/14/23 0532 07/15/23 0527  WBC 11.2* 9.9 8.4  --  9.5 10.2  CREATININE 1.20 1.04 1.13 1.16 1.13 1.00    Estimated Creatinine Clearance: 86.5 mL/min (by C-G formula based on SCr of 1 mg/dL).    Allergies  Allergen Reactions   Vancomycin Nausea And Vomiting and Other (See Comments)    Severe joint pain    Antimicrobials this admission: 7/29 Daptomycin >>  7/29 Unasyn >> 7/31 7/31 Minocycline >>  7/31 Cefepime >>   Microbiology results: 7/29 BCx: negative 7/29 Driveline Wound Cx: pseudomonas/MRSA  7/31 Driveline abscess cx: pseudomonas (pan-sensitive)/MRSA   [name], PharmD, BCPS, [facility] Clinical Pharmacist [phone] Please check AMION for all [facility] numbers 07/15/2023

## 2023-07-15 NOTE — Progress Notes (Signed)
ANTICOAGULATION CONSULT NOTE  Pharmacy Consult for warfarin + fixed-dose heparin Indication:  LVAD  Allergies  Allergen Reactions   Vancomycin Nausea And Vomiting and Other (See Comments)    Severe joint pain    Patient Measurements: Height: 5\' 10"  (177.8 cm) Weight: 90.1 kg (198 lb 10.2 oz) IBW/kg (Calculated) : 73 Heparin Dosing Weight: 91.4 kg   Vital Signs: Temp: 97.7 F (36.5 C) (08/06 0719) Temp Source: Oral (08/06 0719) BP: 125/98 (08/06 0719) Pulse Rate: 89 (08/06 0719)  Labs: Recent Labs    07/13/23 0114 07/13/23 0120 07/14/23 0532 07/15/23 0527  HGB  --   --  11.8* 11.6*  HCT  --   --  36.1* 35.6*  PLT  --   --  219 230  LABPROT  --  16.6* 16.3* 17.2*  INR  --  1.3* 1.3* 1.4*  HEPARINUNFRC  --  <0.10* 0.10* <0.10*  CREATININE 1.16  --  1.13 1.00  CKTOTAL  --   --   --  53    Estimated Creatinine Clearance: 86.5 mL/min (by C-G formula based on SCr of 1 mg/dL).   Medical History: Past Medical History:  Diagnosis Date   "    Arthritis    CAD (coronary artery disease)    a. s/p CABG in 11/2019 with LIMA-LAD, SVG-OM1, SVG-PDA and SVG-D1   CHF (congestive heart failure) (HCC)    a. EF < 20% by echo in 11/2019   Essential hypertension    PAF (paroxysmal atrial fibrillation) (HCC)    Type 2 diabetes mellitus (HCC)     Medications:  Scheduled:   allopurinol  100 mg Oral Daily   amLODipine  10 mg Oral Daily   clonazepam  0.5 mg Oral BID   fluticasone furoate-vilanterol  1 puff Inhalation Daily   And   umeclidinium bromide  1 puff Inhalation Daily   gabapentin  300 mg Oral BID   insulin aspart  0-15 Units Subcutaneous TID WC   insulin aspart  0-5 Units Subcutaneous QHS   metoprolol succinate  25 mg Oral Daily   minocycline  200 mg Oral BID   mirtazapine  15 mg Oral QHS   pantoprazole  40 mg Oral Daily   sertraline  50 mg Oral Daily   tamsulosin  0.4 mg Oral Daily   thiamine  100 mg Oral Daily   Warfarin - Pharmacist Dosing Inpatient   Does  not apply q1600   zinc sulfate  220 mg Oral Daily    Assessment: 62 yom who presented to clinic with concern for driveline fracture and infection. Last [facility] appointment was on 04/21/2023 - INR was subtherapeutic at 1.4 and regimen at that time was 6 mg daily except 4 mg Tues/Thurs (LD 7/25).  Given low INR on admit, started fixed rate heparin 500 units/hr until INR >1.8. Heparin level <0.1, CBC and LDH stable, INR remains low but trending up to 1.4. Ongoing debridements of DLI planned this admission.  Goal of Therapy:  Heparin level <0.3 units/ml INR 2-2.5  - keeping lower INR goal ~1.5 given plan for debridement upcoming week Monitor platelets by anticoagulation protocol: Yes   Plan:  Warfarin 4mg  x1 tonight   Continue heparin infusion at 500 units/hr >> (no up titrations unless discussed with MD) Monitor daily heparin level, INR, CBC, LDH, and for s/sx of bleeding   [name], PharmD, BCPS, [facility] Clinical Pharmacist [phone] Please check AMION for all [facility] numbers 07/15/2023

## 2023-07-15 NOTE — Progress Notes (Signed)
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 Started on IV antibiotics.   Patient presented to VAD clinic 7/29 for driveline communication fault- stating that he has had "alarms every couple hrs from his VAD for a couple months." Pt has no showed and cancelled all appointments in the past 2 months. Pt also had green/blue drainage visible through his driveline bandage. (Dressing change and wound cx obtained). Pt states that he has experienced dizziness associated with a fall multiple times over the past couple months but no signs of bleeding. Controller and modular cable exchanged. No further communication fault alarms noted.   CT Chest/Abd/Pelvis 7/29 1. Similar appearance of mild circumferential thickening of the fat plane surrounding the drive line in the upper abdomen extending from the pump to the skin which may be chronic or represent an infectious process. No drainable fluid collection.  Pt had drive line debridement in OR with placement of instillation wound vac on 07/09/23 with Dr Donata Clay. This was change to wet/dry on Friday 8/2 due to ongoing alarms related to the pts hernia. Plan to return to OR for washout on Thursday 8/8.   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 Currently on IV Cefepime Q 8 hrs and IV Daptomycin 800 mg daily per ID team.   Walking in the hallway this morning. Denies complaints. He is very thankful for the care he is receiving. He is wearing his abdominal binder.  Wound care performed - see below.  Vital signs: Temp: 97.7 HR: 92 Doppler Pressure: 104 Automatic BP: 125/98 (108) O2 Sat: 97% on RA Wt: 193.3>194.8>196.4>194.7>197.9>198.6 lbs  LVAD interrogation reveals:  Speed: 5600 Flow: 4.1 Power: 4.2 w PI: 3.9 Alarms: none Events: on batteries Hct: 36  Fixed speed:  5600 Low speed limit:  5300  Drive Line:  Existing VAD  dressing removed and site care performed using sterile technique. Wound bed cleansed with VASHE solution and sterile 4x4 gauze through light manual debridement. Skin surrounding wound bed cleaned with Chlora prep applicators x 2, allowed to dry. 1 VASHE moistened 4 x 4 placed in wound bed, covered with several dry 4 x 4s. Exit site tunnels approximately 4cm. Drive line unincorporated. Moderate amount of serosanguinous/yellow drainage noted. No redness, tenderness, foul odor or rash noted. Drive line anchor secured. Continue daily wet to dry dressing changes using VASHE solution per bedside RN or VAD coordinator. Next dressing change due 07/16/23 by bedside nurse.     Labs:  LDH trend: 121>133>130>138>172>175  INR trend: 1.0>1.1>1.2>1.4>1.3>1.4  Anticoagulation Plan: -INR Goal: 2.0 - 2.5 -ASA Dose: none  ICD: N/A  Infection:  07/07/23>> blood cultures>> no growth 5 days; final  07/07/23>> wound cx>> few pseudomonas aeruginosa and MRSA; final  07/09/23>> OR driveline cx>> Few pseudomonas aeruginosa and METHICILLIN RESISTANT STAPHYLOCOCCUS AUREUS 07/09/23>> blood cultures>>no growth x 5 days; final  Drips:  Heparin 500 units/hr  Plan/Recommendations:  Page VAD coordinator with equipment issues or driveline problems Daily drive line wet to dry dressing changes with VASHE solution.   Alyce Pagan RN VAD Coordinator  Office: [phone]  24/7 Pager: [phone]

## 2023-07-15 NOTE — Plan of Care (Signed)
  Problem: Education: Goal: Patient will understand all VAD equipment and how it functions Outcome: Progressing Goal: Patient will be able to verbalize current INR target range and antiplatelet therapy for discharge home Outcome: Progressing   Problem: Cardiac: Goal: LVAD will function as expected and patient will experience no clinical alarms Outcome: Progressing   Problem: Education: Goal: Knowledge of General Education information will improve Description: Including pain rating scale, medication(s)/side effects and non-pharmacologic comfort measures Outcome: Progressing   Problem: Health Behavior/Discharge Planning: Goal: Ability to manage health-related needs will improve Outcome: Progressing   Problem: Clinical Measurements: Goal: Ability to maintain clinical measurements within normal limits will improve Outcome: Progressing Goal: Will remain free from infection Outcome: Progressing Goal: Diagnostic test results will improve Outcome: Progressing Goal: Respiratory complications will improve Outcome: Progressing Goal: Cardiovascular complication will be avoided Outcome: Progressing   Problem: Activity: Goal: Risk for activity intolerance will decrease Outcome: Progressing   Problem: Nutrition: Goal: Adequate nutrition will be maintained Outcome: Progressing   Problem: Coping: Goal: Level of anxiety will decrease Outcome: Progressing   Problem: Elimination: Goal: Will not experience complications related to bowel motility Outcome: Progressing Goal: Will not experience complications related to urinary retention Outcome: Progressing   Problem: Pain Managment: Goal: General experience of comfort will improve Outcome: Progressing   Problem: Safety: Goal: Ability to remain free from injury will improve Outcome: Progressing   Problem: Skin Integrity: Goal: Risk for impaired skin integrity will decrease Outcome: Progressing   Problem: Skin Integrity: Goal:  Risk for impaired skin integrity will decrease Outcome: Progressing   Problem: Education: Goal: Ability to describe self-care measures that may prevent or decrease complications (Diabetes Survival Skills Education) will improve Outcome: Progressing Goal: Individualized Educational Video(s) Outcome: Progressing   Problem: Coping: Goal: Ability to adjust to condition or change in health will improve Outcome: Progressing   Problem: Fluid Volume: Goal: Ability to maintain a balanced intake and output will improve Outcome: Progressing   Problem: Health Behavior/Discharge Planning: Goal: Ability to identify and utilize available resources and services will improve Outcome: Progressing Goal: Ability to manage health-related needs will improve Outcome: Progressing   Problem: Metabolic: Goal: Ability to maintain appropriate glucose levels will improve Outcome: Progressing   Problem: Nutritional: Goal: Maintenance of adequate nutrition will improve Outcome: Progressing Goal: Progress toward achieving an optimal weight will improve Outcome: Progressing   Problem: Skin Integrity: Goal: Risk for impaired skin integrity will decrease Outcome: Progressing

## 2023-07-16 ENCOUNTER — Inpatient Hospital Stay (HOSPITAL_COMMUNITY): Payer: 59

## 2023-07-16 DIAGNOSIS — T827XXA Infection and inflammatory reaction due to other cardiac and vascular devices, implants and grafts, initial encounter: Secondary | ICD-10-CM | POA: Diagnosis not present

## 2023-07-16 LAB — GLUCOSE, CAPILLARY
Glucose-Capillary: 187 mg/dL — ABNORMAL HIGH (ref 70–99)
Glucose-Capillary: 257 mg/dL — ABNORMAL HIGH (ref 70–99)
Glucose-Capillary: 213 mg/dL — ABNORMAL HIGH (ref 70–99)
Glucose-Capillary: 401 mg/dL — ABNORMAL HIGH (ref 70–99)

## 2023-07-16 LAB — HEPARIN LEVEL (UNFRACTIONATED): Heparin Unfractionated: <0.1 IU/mL — ABNORMAL LOW (ref 0.30–0.70)

## 2023-07-16 MED ORDER — WARFARIN SODIUM 4 MG PO TABS
4.0000 mg | ORAL_TABLET | Freq: Once | ORAL | Status: AC
  Administered 2023-07-16: 4 mg via ORAL
  Filled 2023-07-16: qty 1

## 2023-07-16 NOTE — Plan of Care (Signed)
  Problem: Education: Goal: Patient will understand all VAD equipment and how it functions Outcome: Progressing Goal: Patient will be able to verbalize current INR target range and antiplatelet therapy for discharge home Outcome: Progressing   Problem: Cardiac: Goal: LVAD will function as expected and patient will experience no clinical alarms Outcome: Progressing   Problem: Education: Goal: Knowledge of General Education information will improve Description: Including pain rating scale, medication(s)/side effects and non-pharmacologic comfort measures Outcome: Progressing   Problem: Health Behavior/Discharge Planning: Goal: Ability to manage health-related needs will improve Outcome: Progressing   Problem: Clinical Measurements: Goal: Ability to maintain clinical measurements within normal limits will improve Outcome: Progressing Goal: Will remain free from infection Outcome: Progressing Goal: Diagnostic test results will improve Outcome: Progressing Goal: Respiratory complications will improve Outcome: Progressing Goal: Cardiovascular complication will be avoided Outcome: Progressing   Problem: Activity: Goal: Risk for activity intolerance will decrease Outcome: Progressing   Problem: Nutrition: Goal: Adequate nutrition will be maintained Outcome: Progressing   Problem: Coping: Goal: Level of anxiety will decrease Outcome: Progressing   Problem: Elimination: Goal: Will not experience complications related to bowel motility Outcome: Progressing Goal: Will not experience complications related to urinary retention Outcome: Progressing   Problem: Pain Managment: Goal: General experience of comfort will improve Outcome: Progressing   Problem: Safety: Goal: Ability to remain free from injury will improve Outcome: Progressing   Problem: Skin Integrity: Goal: Risk for impaired skin integrity will decrease Outcome: Progressing   Problem: Education: Goal: Ability  to describe self-care measures that may prevent or decrease complications (Diabetes Survival Skills Education) will improve Outcome: Progressing Goal: Individualized Educational Video(s) Outcome: Progressing   Problem: Coping: Goal: Ability to adjust to condition or change in health will improve Outcome: Progressing   Problem: Fluid Volume: Goal: Ability to maintain a balanced intake and output will improve Outcome: Progressing   Problem: Health Behavior/Discharge Planning: Goal: Ability to identify and utilize available resources and services will improve Outcome: Progressing Goal: Ability to manage health-related needs will improve Outcome: Progressing   Problem: Metabolic: Goal: Ability to maintain appropriate glucose levels will improve Outcome: Progressing   Problem: Nutritional: Goal: Maintenance of adequate nutrition will improve Outcome: Progressing Goal: Progress toward achieving an optimal weight will improve Outcome: Progressing   Problem: Skin Integrity: Goal: Risk for impaired skin integrity will decrease Outcome: Progressing   Problem: Tissue Perfusion: Goal: Adequacy of tissue perfusion will improve Outcome: Progressing   

## 2023-07-16 NOTE — Progress Notes (Addendum)
7 Days Post-Op Procedure(s) (LRB): DRIVELINE WOUND DEBRIDEMENT (N/A) APPLICATION OF WOUND VAC (N/A) Subjective: No pain from the VAD tunnel wound Receiving daily dressing changes Vashe wet/dry Plan one more wound debridement/ washout in OR 06-16-23 then transition wound care to outpatient after INR reaches therapeutic level and he completes  total 14 days of iv antibiotics.  Objective: Vital signs in last 24 hours: Temp:  [97.7 F (36.5 C)-98.3 F (36.8 C)] 98.3 F (36.8 C) (08/07 1602) Pulse Rate:  [75-100] 90 (08/07 1602) Cardiac Rhythm: Normal sinus rhythm (08/07 1107) Resp:  [16-22] 19 (08/07 1107) BP: (101-118)/(75-96) 102/85 (08/07 1602) SpO2:  [93 %-98 %] 97 % (08/07 1602) Weight:  [89.9 kg] 89.9 kg (08/07 0624)  Hemodynamic parameters for last 24 hours:  stable  Intake/Output from previous day: 08/06 0701 - 08/07 0700 In: 652.9 [I.V.:187; IV Piggyback:465.9] Out: 800 [Urine:800] Intake/Output this shift: Total I/O In: 557.6 [P.O.:240; I.V.:51.6; IV Piggyback:266] Out: -   Exam Alert, comfortable VAD dressing dry Normal VAD hum No edema  Lab Results: Recent Labs    07/15/23 0527 07/16/23 0528  WBC 10.2 9.1  HGB 11.6* 11.1*  HCT 35.6* 34.1*  PLT 230 199   BMET:  Recent Labs    07/15/23 0527 07/16/23 0528  NA 135 135  K 3.9 4.2  CL 101 101  CO2 22 23  GLUCOSE 190* 189*  BUN 22 26*  CREATININE 1.00 1.05  CALCIUM 9.1 9.1    PT/INR:  Recent Labs    07/16/23 0528  LABPROT 18.0*  INR 1.5*   ABG    Component Value Date/Time   PHART 7.508 (H) 11/20/2020 0438   HCO3 24.8 09/09/2021 0313   TCO2 29 11/20/2020 0438   ACIDBASEDEF 6.0 (H) 11/24/2019 1718   O2SAT 63.4 09/09/2021 0313   CBG (last 3)  Recent Labs    07/16/23 0622 07/16/23 1111 07/16/23 1600  GLUCAP 187* 257* 213*    Assessment/Plan: S/P Procedure(s) (LRB): DRIVELINE WOUND DEBRIDEMENT (N/A) APPLICATION OF WOUND VAC (N/A) Continue with coumadin load OR wound care  tomorrow  LOS: 9 days    Lovett Sox 07/16/2023

## 2023-07-16 NOTE — Progress Notes (Signed)
ANTICOAGULATION CONSULT NOTE  Pharmacy Consult for warfarin + fixed-dose heparin Indication:  LVAD  Allergies  Allergen Reactions   Vancomycin Nausea And Vomiting and Other (See Comments)    Severe joint pain    Patient Measurements: Height: 5\' 10"  (177.8 cm) Weight: 89.9 kg (198 lb 3.1 oz) IBW/kg (Calculated) : 73 Heparin Dosing Weight: 91.4 kg   Vital Signs: Temp: 97.9 F (36.6 C) (08/07 0720) Temp Source: Oral (08/07 0720) BP: 113/91 (08/07 0931) Pulse Rate: 87 (08/07 0931)  Labs: Recent Labs    07/14/23 0532 07/15/23 0527 07/16/23 0528  HGB 11.8* 11.6* 11.1*  HCT 36.1* 35.6* 34.1*  PLT 219 230 199  LABPROT 16.3* 17.2* 18.0*  INR 1.3* 1.4* 1.5*  HEPARINUNFRC 0.10* <0.10* <0.10*  CREATININE 1.13 1.00 1.05  CKTOTAL  --  53  --     Estimated Creatinine Clearance: 82.3 mL/min (by C-G formula based on SCr of 1.05 mg/dL).   Medical History: Past Medical History:  Diagnosis Date   "    Arthritis    CAD (coronary artery disease)    a. s/p CABG in 11/2019 with LIMA-LAD, SVG-OM1, SVG-PDA and SVG-D1   CHF (congestive heart failure) (HCC)    a. EF < 20% by echo in 11/2019   Essential hypertension    PAF (paroxysmal atrial fibrillation) (HCC)    Type 2 diabetes mellitus (HCC)     Medications:  Scheduled:   allopurinol  100 mg Oral Daily   amLODipine  10 mg Oral Daily   clonazepam  0.5 mg Oral BID   fluticasone furoate-vilanterol  1 puff Inhalation Daily   And   umeclidinium bromide  1 puff Inhalation Daily   gabapentin  300 mg Oral BID   insulin aspart  0-15 Units Subcutaneous TID WC   insulin aspart  0-5 Units Subcutaneous QHS   metoprolol succinate  25 mg Oral Daily   minocycline  200 mg Oral BID   mirtazapine  15 mg Oral QHS   pantoprazole  40 mg Oral Daily   sertraline  50 mg Oral Daily   tamsulosin  0.4 mg Oral Daily   thiamine  100 mg Oral Daily   traZODone  150 mg Oral QHS   Warfarin - Pharmacist Dosing Inpatient   Does not apply q1600   zinc  sulfate  220 mg Oral Daily    Assessment: 62 yom who presented to clinic with concern for driveline fracture and infection. Last [facility] appointment was on 04/21/2023 - INR was subtherapeutic at 1.4 and regimen at that time was 6 mg daily except 4 mg Tues/Thurs (LD 7/25).  Given low INR on admit, started fixed rate heparin 500 units/hr until INR >1.8. Heparin level <0.1, CBC and LDH stable, INR remains low but trending up to 1.5. Ongoing debridements of DLI planned this admission, next tomorrow 8/8.  Goal of Therapy:  Heparin level <0.3 units/ml INR 2-2.5  - keeping lower INR goal ~1.5 given plan for debridement upcoming week Monitor platelets by anticoagulation protocol: Yes   Plan:  Warfarin 4mg  x1 tonight   Continue heparin infusion at 500 units/hr >> (no up titrations unless discussed with MD) Monitor daily heparin level, INR, CBC, LDH, and for s/sx of bleeding   [name], PharmD, BCPS, [facility] Clinical Pharmacist [phone] Please check AMION for all [facility] Pharmacy numbers 07/16/2023

## 2023-07-16 NOTE — Progress Notes (Signed)
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 Started on IV antibiotics.   Patient presented to VAD clinic 7/29 for driveline communication fault- stating that he has had "alarms every couple hrs from his VAD for a couple months." Pt has no showed and cancelled all appointments in the past 2 months. Pt also had green/blue drainage visible through his driveline bandage. (Dressing change and wound cx obtained). Pt states that he has experienced dizziness associated with a fall multiple times over the past couple months but no signs of bleeding. Controller and modular cable exchanged. No further communication fault alarms noted.   CT Chest/Abd/Pelvis 7/29 1. Similar appearance of mild circumferential thickening of the fat plane surrounding the drive line in the upper abdomen extending from the pump to the skin which may be chronic or represent an infectious process. No drainable fluid collection.  Pt had drive line debridement in OR with placement of instillation wound vac on 07/09/23 with Dr Donata Clay. This was change to wet/dry on Friday 8/2 due to ongoing alarms related to the pts hernia. Plan to return to OR for washout on Thursday 8/8.   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 Currently on IV Cefepime Q 8 hrs and IV Daptomycin 800 mg daily per ID team.   Pt sitting on side of the bed. States he walked earlier this morning. Denies complaints. He is very thankful for the care he is receiving. He is wearing his abdominal binder.  Wound care performed - see below.  Vital signs: Temp: 98.1 HR: 87 Doppler Pressure: 90 Automatic BP: 118/82 (93) O2 Sat: 93% on RA Wt: 193.3>194.8>196.4>194.7>197.9>198.6>198.2 lbs  LVAD interrogation reveals:  Speed: 5600 Flow: 4.1 Power: 4.2 w PI: 3.7 Alarms: none Events: 10-20 PI events today Hct: 36  Fixed speed:  5600 Low speed  limit:  5300  Drive Line:  Existing VAD dressing removed and site care performed using sterile technique. Wound bed cleansed with VASHE solution and sterile 4x4 gauze through light manual debridement. Skin surrounding wound bed cleaned with Chlora prep applicators x 2, allowed to dry. 1 VASHE moistened 4 x 4 placed in wound bed, covered with several dry 4 x 4s. Exit site tunnels approximately 4cm. Drive line unincorporated. Moderate amount of serosanguinous/yellow drainage noted. No redness, tenderness, foul odor or rash noted. Drive line anchor secured. Continue daily wet to dry dressing changes using VASHE solution per bedside RN or VAD coordinator. Next dressing change due 07/17/23 by bedside nurse.     Labs:  LDH trend: 121>133>130>138>172>175>165  INR trend: 1.0>1.1>1.2>1.4>1.3>1.4>1.5  Anticoagulation Plan: -INR Goal: 2.0 - 2.5 -ASA Dose: none  ICD: N/A  Infection:  07/07/23>> blood cultures>> no growth 5 days; final  07/07/23>> wound cx>> few pseudomonas aeruginosa and MRSA; final  07/09/23>> OR driveline cx>> Few pseudomonas aeruginosa and METHICILLIN RESISTANT STAPHYLOCOCCUS AUREUS 07/09/23>> blood cultures>>no growth x 5 days; final  Drips:  Heparin 500 units/hr  Plan/Recommendations:  Page VAD coordinator with equipment issues or driveline problems Daily drive line wet to dry dressing changes with VASHE solution.   Simmie Davies RN,BSN VAD Coordinator  Office: [phone]  24/7 Pager: [phone]

## 2023-07-16 NOTE — Inpatient Diabetes Management (Signed)
Inpatient Diabetes Program Recommendations  AACE/ADA: New Consensus Statement on Inpatient Glycemic Control   Target Ranges:  Prepandial:   less than 140 mg/dL      Peak postprandial:   less than 180 mg/dL (1-2 hours)      Critically ill patients:  140 - 180 mg/dL    Latest Reference Range & Units 07/15/23 06:14 07/15/23 11:03 07/15/23 15:44 07/15/23 21:05 07/16/23 06:22  Glucose-Capillary 70 - 99 mg/dL 562 (H) 130 (H) 865 (H) 239 (H) 187 (H)   Review of Glycemic Control  Diabetes history: DM2 Outpatient Diabetes medications: Glipizide 5 mg daily Current orders for Inpatient glycemic control: Novolog 0-15 units TID with meals, Novolog 0-5 units QHS   Inpatient Diabetes Program Recommendations:     Insulin: Please consider ordering Novolog 3 units TID with meals for meal coverage if patient eats at least 50% of meals.   Thanks, Orlando Penner, RN, MSN, CDCES Diabetes Coordinator Inpatient Diabetes Program [phone] (Team Pager from 8am to 5pm)

## 2023-07-16 NOTE — Progress Notes (Addendum)
Advanced Heart Failure VAD Team Note  PCP-Cardiologist: Nona Dell, MD  [facility]: Dr. Shirlee Latch   Subjective:    7/29: admitted w/ suspected DL fracture and DL infection. P/w "driveline communication fault" error message on LVAD. No pump stoppages. LVAD coordinator replaced modular cable and controller with resolution of driveline communication fault signal. CT C/A/P c/w prior imaging w/ similar appearance of mild circumferential thickening of the fat plane surrounding the drive line in the upper abdomen extending from the pump to the skin which may be chronic or represent an infectious process. No drainable fluid collection. Wound + BCx collected. Started on empiric daptomycin/Unasyn per ID.  7/30: Wound Cx growing Pseudomonas Aeruginosa (pan-sensitive) and S. aureus. Cefepime added 7/31: OR for wound debridement + application of wound vac.    Remains on Dapto, Cefepime and minocycline. Surgical wound with pseudomonas aeruginosa and MRSA. Bcx from 7/29 and 7/31 NG. AF. WBC WNL.   INR 1.5. Remains on heparin gtt.  Feels fine this morning. Has been ambulating in halls and outside (needs to be with staff member when going on walks).   LVAD INTERROGATION:  HeartMate III LVAD:   Flow 4.3 liters/min, speed 5600, power 4.3, PI 3.1. unable to fully interrogate LVAD as disconnected from monitor  Objective:    Vital Signs:   Temp:  [97.7 F (36.5 C)-98.2 F (36.8 C)] 97.9 F (36.6 C) (08/07 0720) Pulse Rate:  [75-100] 86 (08/07 0720) Resp:  [15-22] 17 (08/07 0720) BP: (91-132)/(75-106) 109/90 (08/07 0720) SpO2:  [96 %-98 %] 98 % (08/07 0720) Weight:  [89.9 kg] 89.9 kg (08/07 0624) Last BM Date : 07/13/23 Mean arterial Pressure 90s-100s  Intake/Output:   Intake/Output Summary (Last 24 hours) at 07/16/2023 0748 Last data filed at 07/16/2023 0639 Gross per 24 hour  Intake 652.92 ml  Output 800 ml  Net -147.08 ml     Physical Exam  General:  Well appearing. No resp difficulty HEENT:  Normal Neck: supple. JVP ~7. Carotids 2+ bilat; no bruits. No lymphadenopathy or thyromegaly appreciated. Cor: Mechanical heart sounds with LVAD hum present. Lungs: Clear Abdomen: soft, nontender, nondistended. No hepatosplenomegaly. No bruits or masses. Good bowel sounds. +abdominal binder Driveline: C/D/I; securement device intact. Extremities: no cyanosis, clubbing, rash, edema Neuro: alert & orientedx3, cranial nerves grossly intact. moves all 4 extremities w/o difficulty. Affect pleasant  Telemetry   NSR 80s (Personally reviewed)    EKG    N/A   Labs   Basic Metabolic Panel: Recent Labs  Lab 07/12/23 0046 07/13/23 0114 07/14/23 0532 07/15/23 0527 07/16/23 0528  NA 135 135 136 135 135  K 3.8 3.7 4.3 3.9 4.2  CL 99 101 104 101 101  CO2 24 22 23 22 23   GLUCOSE 227* 166* 200* 190* 189*  BUN 26* 27* 25* 22 26*  CREATININE 1.13 1.16 1.13 1.00 1.05  CALCIUM 8.8* 8.7* 9.0 9.1 9.1    Liver Function Tests: No results for input(s): "AST", "ALT", "ALKPHOS", "BILITOT", "PROT", "ALBUMIN" in the last 168 hours.  CBC: Recent Labs  Lab 07/11/23 0111 07/12/23 0046 07/14/23 0532 07/15/23 0527 07/16/23 0528  WBC 9.9 8.4 9.5 10.2 9.1  HGB 11.6* 10.9* 11.8* 11.6* 11.1*  HCT 34.9* 33.3* 36.1* 35.6* 34.1*  MCV 84.7 85.6 83.8 84.0 84.8  PLT 213 202 219 230 199    INR: Recent Labs  Lab 07/12/23 0046 07/13/23 0120 07/14/23 0532 07/15/23 0527 07/16/23 0528  INR 1.4* 1.3* 1.3* 1.4* 1.5*    Other results: EKG:  Imaging   No results found.   Medications:     Scheduled Medications:  allopurinol  100 mg Oral Daily   amLODipine  10 mg Oral Daily   clonazepam  0.5 mg Oral BID   fluticasone furoate-vilanterol  1 puff Inhalation Daily   And   umeclidinium bromide  1 puff Inhalation Daily   gabapentin  300 mg Oral BID   insulin aspart  0-15 Units Subcutaneous TID WC   insulin aspart  0-5 Units Subcutaneous QHS   metoprolol succinate  25 mg Oral Daily    minocycline  200 mg Oral BID   mirtazapine  15 mg Oral QHS   pantoprazole  40 mg Oral Daily   sertraline  50 mg Oral Daily   tamsulosin  0.4 mg Oral Daily   thiamine  100 mg Oral Daily   traZODone  150 mg Oral QHS   Warfarin - Pharmacist Dosing Inpatient   Does not apply q1600   zinc sulfate  220 mg Oral Daily    Infusions:  ceFEPime (MAXIPIME) IV 2 g (07/16/23 0636)   DAPTOmycin (CUBICIN) 800 mg in sodium chloride 0.9 % IVPB Stopped (07/15/23 1428)   heparin 500 Units/hr (07/16/23 0528)    PRN Medications: acetaminophen, fluticasone   Patient Profile   62 y.o. 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 Now s/p Heartmate 3 LVAD. Now admitted w/ suspected DL fracture after multiple falls and recurrent DL infection.   Assessment/Plan:   1.  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  Cultures have grown acinetobacter and MRSA. He was readmitted with driveline infection in 2/24, completed course of daptomycin for MRSA. DL site with new green/yellow drainage. Now readmitted w/ suspected DL fracture and DL infection. P/w "driveline communication fault" error message on LVAD. No pump stoppages. Also w/ significant greenish drainage from DL, in setting of poor compliance w/ suppressive minocycline. Recent fall ~2 wks prior, likely cause of DL trauma. LVAD coordinator replaced modular cable and controller with resolution of driveline communication fault signal. CT C/A/P c/w prior imaging w/ similar appearance of mild circumferential thickening of the fat plane surrounding the drive line in the upper abdomen extending from the pump to the skin which may be chronic or represent an infectious process. No drainable  fluid collection. Wound + BCx collected. Started on empiric daptomycin/Unasyn per ID. Initial wound Cx + for Pseudomonas Aeruginosa (pan-sensitive) and S. aureus. Cefepime added. Now on Dapto, Cefepime and minocycline. BCx NGTD. Taken to OR 7/31 for wound debridement + application of wound vac - continue abx per ID. Appreciate their assistance. ID considering 4 weeks of daptomycin then Q 3 wks dalbavancin. Would have significant concern about him receiving IV abx at home with history of drug use and noncompliance. Would likely need to discharge to SNF if needs IV abx after discharge but he does not want to return to SNF. - Back to OR 8/7 for wound wash out / vac change.   2. Suspected DL fracture with multiple recent falls - Has had multiple falls during the last 2 months, one direct trauma to mid sternum (fell on cinder block) - CT chest/abd/pelvis per above  - CXR w/ LVAD partially imaged. The associated lead noted to be intact  w/ no fracture or kink  - LVAD coordinators changed modular cable and controller  3.  Chronic systolic CHF: Echo 10/21 with EF 20-25%, mildly decreased RV function. LHC/RHC in 12/21 with patent grafts, low output. Suspect mixed ischemic/nonischemic cardiomyopathy (prior heavy ETOH and drugs as well as CAD). Admitted with cardiogenic shock in 12/21, had placement of Impella 5.5 initially, now s/p Heartmate 3 LVAD on 11/17/20.  Ramp echo 2/23 with speed decreased to 5500 rpm. Speed now back to 5600 rpm.   - He is not volume overloaded on exam.      - Continue current Toprol XL 25 mg daily.  - He does not need a diuretic.   - INR 1.1 on admission. Suspect noncompliant with Warfarin. - INR 1.5 today; continue heparin gtt   4. CKD stage 3: Prior CTA abdomen/pelvis with high-grade stenosis of proximal right renal artery.  - Would consider eventual reassessment of renal artery stenosis and possible treatment  - SCr stable   5.  CAD: S/p CABG 12/20.  LHC pre-VAD with patent  grafts, no target for intervention. - Continue Crestor 20 mg daily.   6.  Atrial fibrillation: Paroxysmal.  S/p Maze and LA appendage clip with CABG in 12/20. Maintaining SR.  - Continue Toprol XL 25 mg daily.  - Off amiodarone with hyperthyroidism.  7. Type 2 diabetes: improved control, Hgb A1c down from 12.1>>6.3  - continue SSI   8. 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 He says last use of methamphetamine was > 2 months ago. - UDS this admit negative (however collected > 48 hrs post admit)  - Declines substance use resources  9. 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  Followed by endocrinology. He is now off amiodarone. He had been on methimazole but has not been taking recently. TSH was high on admit and free T4 normal  - Will NOT restart methimazole   10. Anemia (chronic blood loss):  Hgb stable. No gross bleeding.  - follow CBP   11. COPD: Prior smoker.  - Continue Trelegy.   12. PVCs/NSVT:  - Now off amiodarone.   13. Depression: Has turned to substance abuse to cope.   - Has been referred to OP psych in [place]   14.  Gout: Continue allopurinol, he has prn colchicine.   15. HTN: MAPs elevated. In setting of known Rt RAS. Hold off on ACE/ARB. - monitor closely, if remains elevated may need reassessment of renal artery stenosis and possible treatment. Significant calcifications in both renal arteries on CT abdomen this admit. - Amlodipine started at 10 mg on 08/05 (did not receive on 08/04).  16. Agitation: - Klonopin increased, agitation improved  17. Incisional hernia: -Continue abdominal binder   Plan to return to OR on 08/07. Appreciate ID assistance. Do not think home IV antibiotics would be feasible for him. He has been noncompliant with outpatient follow-up and medical management. Refuses to go back to SNF at discharge.   I reviewed the LVAD parameters from today, and compared the results to the patient's prior recorded  data.  No programming changes were made.  The LVAD is functioning within specified parameters.  The patient performs LVAD self-test daily.  LVAD interrogation was negative for any significant power changes, alarms or PI events/speed drops.  LVAD equipment check completed and is in good working order.  Back-up equipment present.   LVAD education done on emergency procedures and precautions and reviewed exit site care.  Length of Stay: [address]  Merlene Morse, NP 07/16/2023, 7:48 AM  VAD Team --- VAD ISSUES ONLY--- Pager [phone] (7am - 7am)  Advanced Heart Failure Team  Pager [phone] (M-F; 7a - 5p)  Please contact [facility] for night-coverage after hours (5p -7a ) and weekends on [email or web address]  Patient seen and examined with the above-signed Advanced Practice Provider and/or Housestaff. I personally reviewed laboratory data, imaging studies and relevant notes. I independently examined the patient and formulated the important aspects of the plan. I have edited the note to reflect any of my changes or salient points. I have personally discussed the plan with the patient and/or family.  Remains on IV abx. No fevers or chills. Wound vac working well.   Agitation improved on Klonopin. INR 1.5 on heparin gtt.   General:  NAD.  HEENT: normal  Neck: supple. JVP not elevated.  Carotids 2+ bilat; no bruits. No lymphadenopathy or thryomegaly appreciated. Cor: LVAD hum.  Lungs: Clear. Abdomen:e soft, nontender, non-distended. No hepatosplenomegaly. No bruits or masses. Good bowel sounds Large incisional hernia. Wound vac ok  Extremities: no cyanosis, clubbing, rash. Warm no edema  Neuro: alert & oriented x 3. No focal deficits. Moves all 4 without problem   Continue IV abx. For Wound Vac change tomorrow. Continue heparin/warfarin. Discussed dosing with PharmD personally.  Continue to treat fir withdrawal symptoms.   VAD interrogated personally. Parameters stable.  Arvilla Meres, MD  9:33 AM

## 2023-07-17 ENCOUNTER — Other Ambulatory Visit: Payer: Self-pay

## 2023-07-17 ENCOUNTER — Inpatient Hospital Stay (HOSPITAL_COMMUNITY): Payer: Self-pay | Admitting: Anesthesiology

## 2023-07-17 ENCOUNTER — Encounter (HOSPITAL_COMMUNITY)
Admission: AD | Disposition: A | Discharge status: Home / Self Care | Payer: Self-pay | Source: Ambulatory Visit | Attending: Internal Medicine

## 2023-07-17 ENCOUNTER — Inpatient Hospital Stay (HOSPITAL_COMMUNITY): Payer: 59 | Admitting: Anesthesiology

## 2023-07-17 DIAGNOSIS — T827XXA Infection and inflammatory reaction due to other cardiac and vascular devices, implants and grafts, initial encounter: Secondary | ICD-10-CM

## 2023-07-17 HISTORY — PX: STERNAL WOUND DEBRIDEMENT: SHX1058

## 2023-07-17 LAB — GLUCOSE, CAPILLARY
Glucose-Capillary: 205 mg/dL — ABNORMAL HIGH (ref 70–99)
Glucose-Capillary: 148 mg/dL — ABNORMAL HIGH (ref 70–99)
Glucose-Capillary: 109 mg/dL — ABNORMAL HIGH (ref 70–99)
Glucose-Capillary: 126 mg/dL — ABNORMAL HIGH (ref 70–99)
Glucose-Capillary: 213 mg/dL — ABNORMAL HIGH (ref 70–99)

## 2023-07-17 SURGERY — DEBRIDEMENT, WOUND, STERNUM
Anesthesia: Monitor Anesthesia Care

## 2023-07-17 MED ORDER — PROPOFOL 10 MG/ML IV BOLUS
INTRAVENOUS | Status: AC
  Filled 2023-07-17: qty 20

## 2023-07-17 MED ORDER — WARFARIN SODIUM 3 MG PO TABS
6.0000 mg | ORAL_TABLET | Freq: Once | ORAL | Status: AC
  Administered 2023-07-17: 6 mg via ORAL
  Filled 2023-07-17: qty 2

## 2023-07-17 MED ORDER — SODIUM CHLORIDE 0.9 % IR SOLN
Status: DC | PRN
  Administered 2023-07-17: 1

## 2023-07-17 MED ORDER — LIDOCAINE 2% (20 MG/ML) 5 ML SYRINGE
INTRAMUSCULAR | Status: DC | PRN
  Administered 2023-07-17: 100 mg via INTRAVENOUS

## 2023-07-17 MED ORDER — FENTANYL CITRATE (PF) 100 MCG/2ML IJ SOLN
INTRAMUSCULAR | Status: DC | PRN
  Administered 2023-07-17: 50 ug via INTRAVENOUS

## 2023-07-17 MED ORDER — LACTATED RINGERS IV SOLN: INTRAVENOUS | Status: DC

## 2023-07-17 MED ORDER — ORAL CARE MOUTH RINSE: 15.0000 mL | Freq: Once | OROMUCOSAL | Status: AC

## 2023-07-17 MED ORDER — CHLORHEXIDINE GLUCONATE 0.12 % MT SOLN
15.0000 mL | Freq: Once | OROMUCOSAL | Status: AC
  Administered 2023-07-17: 15 mL via OROMUCOSAL

## 2023-07-17 MED ORDER — PHENYLEPHRINE 80 MCG/ML (10ML) SYRINGE FOR IV PUSH (FOR BLOOD PRESSURE SUPPORT)
PREFILLED_SYRINGE | INTRAVENOUS | Status: DC | PRN
  Administered 2023-07-17: 80 ug via INTRAVENOUS

## 2023-07-17 MED ORDER — PROPOFOL 500 MG/50ML IV EMUL
INTRAVENOUS | Status: DC | PRN
Start: 2023-07-17 — End: 2023-07-17
  Administered 2023-07-17: 125 ug/kg/min via INTRAVENOUS

## 2023-07-17 MED ORDER — NOREPINEPHRINE 4 MG/250ML-% IV SOLN
INTRAVENOUS | Status: DC | PRN
Start: 2023-07-17 — End: 2023-07-17
  Administered 2023-07-17: 4 ug/min via INTRAVENOUS

## 2023-07-17 MED ORDER — HEPARIN (PORCINE) 25000 UT/250ML-% IV SOLN
500.0000 [IU]/h | INTRAVENOUS | Status: DC
  Administered 2023-07-17: 500 [IU]/h via INTRAVENOUS
  Filled 2023-07-17 (×2): qty 250

## 2023-07-17 MED ORDER — HYDROCODONE-ACETAMINOPHEN 5-325 MG PO TABS
1.0000 | ORAL_TABLET | ORAL | Status: DC | PRN
  Administered 2023-07-17 – 2023-07-19 (×5): 2 via ORAL
  Administered 2023-07-20: 1 via ORAL
  Administered 2023-07-20 – 2023-07-26 (×17): 2 via ORAL
  Filled 2023-07-17 (×10): qty 2
  Filled 2023-07-17: qty 1
  Filled 2023-07-17 (×12): qty 2

## 2023-07-17 MED ORDER — ONDANSETRON HCL 4 MG/2ML IJ SOLN
INTRAMUSCULAR | Status: DC | PRN
  Administered 2023-07-17: 4 mg via INTRAVENOUS

## 2023-07-17 MED ORDER — PROPOFOL 10 MG/ML IV BOLUS
INTRAVENOUS | Status: DC | PRN
Start: 2023-07-17 — End: 2023-07-17
  Administered 2023-07-17: 50 mg via INTRAVENOUS

## 2023-07-17 MED ORDER — FENTANYL CITRATE (PF) 250 MCG/5ML IJ SOLN
INTRAMUSCULAR | Status: AC
  Filled 2023-07-17: qty 5

## 2023-07-17 MED ORDER — PROPOFOL 1000 MG/100ML IV EMUL
INTRAVENOUS | Status: AC
  Filled 2023-07-17: qty 100

## 2023-07-17 SURGICAL SUPPLY — 68 items
APL SKNCLS STERI-STRIP NONHPOA (GAUZE/BANDAGES/DRESSINGS)
ATTRACTOMAT 16X20 MAGNETIC DRP (DRAPES) ×1 IMPLANT
BAG DECANTER FOR FLEXI CONT (MISCELLANEOUS) ×1 IMPLANT
BENZOIN TINCTURE PRP APPL 2/3 (GAUZE/BANDAGES/DRESSINGS) IMPLANT
BLADE CLIPPER SURG (BLADE) ×1 IMPLANT
BLADE SURG 10 STRL SS (BLADE) ×1 IMPLANT
BLADE SURG 15 STRL LF DISP TIS (BLADE) IMPLANT
BLADE SURG 15 STRL SS (BLADE)
BNDG GAUZE DERMACEA FLUFF 4 (GAUZE/BANDAGES/DRESSINGS) IMPLANT
BNDG GZE DERMACEA 4 6PLY (GAUZE/BANDAGES/DRESSINGS) ×1
CANISTER SUCT 3000ML PPV (MISCELLANEOUS) ×1 IMPLANT
CANISTER WOUND CARE 500ML ATS (WOUND CARE) ×1 IMPLANT
CATH FOLEY 2WAY SLVR 5CC 16FR (CATHETERS) IMPLANT
CATH THORACIC 28FR RT ANG (CATHETERS) IMPLANT
CATH THORACIC 36FR (CATHETERS) IMPLANT
CLIP TI WIDE RED SMALL 24 (CLIP) IMPLANT
CNTNR URN SCR LID CUP LEK RST (MISCELLANEOUS) IMPLANT
CONN Y 3/8X3/8X3/8 BEN (MISCELLANEOUS) IMPLANT
CONT SPEC 4OZ STRL OR WHT (MISCELLANEOUS)
CONTAINER PROTECT SURGISLUSH (MISCELLANEOUS) ×2 IMPLANT
COVER SURGICAL LIGHT HANDLE (MISCELLANEOUS) ×2 IMPLANT
DRAPE LAPAROSCOPIC ABDOMINAL (DRAPES) ×1 IMPLANT
DRAPE SLUSH/WARMER DISC (DRAPES) IMPLANT
DRAPE WARM FLUID 44X44 (DRAPES) IMPLANT
DRSG AQUACEL AG ADV 3.5X14 (GAUZE/BANDAGES/DRESSINGS) ×1 IMPLANT
DRSG TEGADERM 4X4.75 (GAUZE/BANDAGES/DRESSINGS) IMPLANT
DRSG VAC GRANUFOAM LG (GAUZE/BANDAGES/DRESSINGS) ×1 IMPLANT
DRSG VAC GRANUFOAM MED (GAUZE/BANDAGES/DRESSINGS) ×1 IMPLANT
DRSG VAC GRANUFOAM SM (GAUZE/BANDAGES/DRESSINGS) ×1 IMPLANT
ELECT REM PT RETURN 9FT ADLT (ELECTROSURGICAL) ×1
ELECTRODE REM PT RTRN 9FT ADLT (ELECTROSURGICAL) ×1 IMPLANT
GAUZE 4X4 16PLY ~~LOC~~+RFID DBL (SPONGE) ×1 IMPLANT
GAUZE PAD ABD 8X10 STRL (GAUZE/BANDAGES/DRESSINGS) IMPLANT
GAUZE SPONGE 4X4 12PLY STRL (GAUZE/BANDAGES/DRESSINGS) ×1 IMPLANT
GAUZE XEROFORM 5X9 LF (GAUZE/BANDAGES/DRESSINGS) IMPLANT
GLOVE BIO SURGEON STRL SZ7.5 (GLOVE) ×2 IMPLANT
GOWN STRL REUS W/ TWL LRG LVL3 (GOWN DISPOSABLE) ×4 IMPLANT
GOWN STRL REUS W/TWL LRG LVL3 (GOWN DISPOSABLE) ×4
HANDPIECE INTERPULSE COAX TIP (DISPOSABLE) ×1
HEMOSTAT POWDER SURGIFOAM 1G (HEMOSTASIS) IMPLANT
HEMOSTAT SURGICEL 2X14 (HEMOSTASIS) IMPLANT
KIT BASIN OR (CUSTOM PROCEDURE TRAY) ×1 IMPLANT
KIT SUCTION CATH 14FR (SUCTIONS) IMPLANT
KIT TURNOVER KIT B (KITS) ×1 IMPLANT
NS IRRIG 1000ML POUR BTL (IV SOLUTION) ×1 IMPLANT
PACK CHEST (CUSTOM PROCEDURE TRAY) ×1 IMPLANT
PACK GENERAL/GYN (CUSTOM PROCEDURE TRAY) ×1 IMPLANT
PAD ARMBOARD 7.5X6 YLW CONV (MISCELLANEOUS) ×2 IMPLANT
SET HNDPC FAN SPRY TIP SCT (DISPOSABLE) ×1 IMPLANT
SOL PREP POV-IOD 4OZ 10% (MISCELLANEOUS) IMPLANT
SPONGE T-LAP 18X18 ~~LOC~~+RFID (SPONGE) ×5 IMPLANT
SPONGE T-LAP 4X18 ~~LOC~~+RFID (SPONGE) ×1 IMPLANT
STAPLER VISISTAT 35W (STAPLE) IMPLANT
SUT ETHILON 3 0 FSL (SUTURE) IMPLANT
SUT STEEL 6MS V (SUTURE) IMPLANT
SUT STEEL STERNAL CCS#1 18IN (SUTURE) IMPLANT
SUT STEEL SZ 6 DBL 3X14 BALL (SUTURE) IMPLANT
SUT VIC AB 1 CTX 36 (SUTURE)
SUT VIC AB 1 CTX36XBRD ANBCTR (SUTURE) ×2 IMPLANT
SUT VIC AB 2-0 CTX 27 (SUTURE) ×2 IMPLANT
SUT VIC AB 3-0 X1 27 (SUTURE) ×2 IMPLANT
SWAB COLLECTION DEVICE MRSA (MISCELLANEOUS) IMPLANT
SWAB CULTURE ESWAB REG 1ML (MISCELLANEOUS) IMPLANT
SYR 5ML LL (SYRINGE) IMPLANT
TOWEL GREEN STERILE (TOWEL DISPOSABLE) ×1 IMPLANT
TOWEL GREEN STERILE FF (TOWEL DISPOSABLE) ×1 IMPLANT
TRAY FOLEY MTR SLVR 16FR STAT (SET/KITS/TRAYS/PACK) IMPLANT
WATER STERILE IRR 1000ML POUR (IV SOLUTION) ×1 IMPLANT

## 2023-07-17 NOTE — Anesthesia Postprocedure Evaluation (Signed)
Anesthesia Post Note  Patient: Kevin Underwood.  Procedure(s) Performed: DRIVELINE WOUND DEBRIDEMENT     Patient location during evaluation: PACU Anesthesia Type: MAC Level of consciousness: awake and alert Pain management: pain level controlled Vital Signs Assessment: post-procedure vital signs reviewed and stable Respiratory status: spontaneous breathing, nonlabored ventilation, respiratory function stable and patient connected to nasal cannula oxygen Cardiovascular status: stable and blood pressure returned to baseline Postop Assessment: no apparent nausea or vomiting Anesthetic complications: no   No notable events documented.  Last Vitals:  Vitals:   07/17/23 1210 07/17/23 1251  BP: (!) 111/96 (!) 118/95  Pulse: 92 76  Resp: 20 18  Temp: 36.6 C 36.7 C  SpO2: 95% 99%    Last Pain:  Vitals:   07/17/23 1306  TempSrc:   PainSc: 0-No pain                 Mariann Barter

## 2023-07-17 NOTE — Transfer of Care (Signed)
Immediate Anesthesia Transfer of Care Note  Patient: Kevin Underwood.  Procedure(s) Performed: DRIVELINE WOUND DEBRIDEMENT  Patient Location: PACU  Anesthesia Type:MAC  Level of Consciousness: awake, alert , and oriented  Airway & Oxygen Therapy: Patient Spontanous Breathing and Patient connected to nasal cannula oxygen  Post-op Assessment: Report given to RN and Post -op Vital signs reviewed and stable  Post vital signs: Reviewed  Last Vitals:  Vitals Value Taken Time  BP 108/88 07/17/23 1423  Temp    Pulse    Resp 15 07/17/23 1427  SpO2    Vitals shown include unfiled device data.  Last Pain:  Vitals:   07/17/23 1306  TempSrc:   PainSc: 0-No pain         Complications: No notable events documented.

## 2023-07-17 NOTE — Progress Notes (Signed)
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 Started on IV antibiotics.   Patient presented to VAD clinic 7/29 for driveline communication fault- stating that he has had "alarms every couple hrs from his VAD for a couple months." Pt has no showed and cancelled all appointments in the past 2 months. Pt also had green/blue drainage visible through his driveline bandage. (Dressing change and wound cx obtained). Pt states that he has experienced dizziness associated with a fall multiple times over the past couple months but no signs of bleeding. Controller and modular cable exchanged. No further communication fault alarms noted.   CT Chest/Abd/Pelvis 7/29 1. Similar appearance of mild circumferential thickening of the fat plane surrounding the drive line in the upper abdomen extending from the pump to the skin which may be chronic or represent an infectious process. No drainable fluid collection.  Pt had drive line debridement in OR with placement of instillation wound vac on 07/09/23 with Dr Donata Clay. This was change to wet/dry on Friday 8/2 due to ongoing alarms related to the pts hernia. Plan to return to OR for washout today.   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 Currently on IV Cefepime Q 8 hrs and IV Daptomycin 800 mg daily per ID team.   Pt sitting on side of the bed. States he walked earlier this morning. Denies complaints. He is very thankful for the care he is receiving. He is wearing his abdominal binder.  VAD coordinator will accompany pt to OR.  Vital signs: Temp: 97.6 HR: 88 Doppler Pressure: 88 Automatic BP: 123/91 (102) O2 Sat: 100% on RA Wt: 193.3>194.8>196.4>194.7>197.9>198.6>198.2>200.8 lbs  LVAD interrogation reveals:  Speed: 5600 Flow: 4.0 Power: 4.2 w PI: 4.4 Alarms: none Events: 12 PI events today Hct: 34  Fixed speed:  5600 Low speed  limit:  5300  Drive Line:  CDI. PVT will change in OR today. Continue daily wet to dry dressing changes using VASHE solution per bedside RN or VAD coordinator. Next dressing change due 07/18/23 by bedside nurse.     Labs:  LDH trend: 121>133>130>138>172>175>165>158  INR trend: 1.0>1.1>1.2>1.4>1.3>1.4>1.5>1.4  Anticoagulation Plan: -INR Goal: 2.0 - 2.5 -ASA Dose: none  ICD: N/A  Infection:  07/07/23>> blood cultures>> no growth 5 days; final  07/07/23>> wound cx>> few pseudomonas aeruginosa and MRSA; final  07/09/23>> OR driveline cx>> Few pseudomonas aeruginosa and METHICILLIN RESISTANT STAPHYLOCOCCUS AUREUS 07/09/23>> blood cultures>>no growth x 5 days; final  Drips:  Heparin 500 units/hr  Plan/Recommendations:  Page VAD coordinator with equipment issues or driveline problems Daily drive line wet to dry dressing changes with VASHE solution.   Carlton Adam RN,BSN VAD Coordinator  Office: [phone]  24/7 Pager: [phone]

## 2023-07-17 NOTE — Progress Notes (Addendum)
ANTICOAGULATION CONSULT NOTE  Pharmacy Consult for warfarin + fixed-dose heparin Indication:  LVAD  Allergies  Allergen Reactions   Vancomycin Nausea And Vomiting and Other (See Comments)    Severe joint pain    Patient Measurements: Height: 5\' 10"  (177.8 cm) Weight: 91.1 kg (200 lb 13.4 oz) IBW/kg (Calculated) : 73 Heparin Dosing Weight: 91.4 kg   Vital Signs: Temp: 97.6 F (36.4 C) (08/08 0754) Temp Source: Oral (08/08 0754) BP: 123/91 (08/08 0754) Pulse Rate: 90 (08/08 0754)  Labs: Recent Labs    07/15/23 0527 07/16/23 0528 07/17/23 0616  HGB 11.6* 11.1* 11.2*  HCT 35.6* 34.1* 34.0*  PLT 230 199 192  LABPROT 17.2* 18.0* 17.4*  INR 1.4* 1.5* 1.4*  HEPARINUNFRC <0.10* <0.10* <0.10*  CREATININE 1.00 1.05 1.07  CKTOTAL 53  --   --     Estimated Creatinine Clearance: 81.2 mL/min (by C-G formula based on SCr of 1.07 mg/dL).   Medical History: Past Medical History:  Diagnosis Date   "    Arthritis    CAD (coronary artery disease)    a. s/p CABG in 11/2019 with LIMA-LAD, SVG-OM1, SVG-PDA and SVG-D1   CHF (congestive heart failure) (HCC)    a. EF < 20% by echo in 11/2019   Essential hypertension    PAF (paroxysmal atrial fibrillation) (HCC)    Type 2 diabetes mellitus (HCC)     Medications:  Scheduled:   allopurinol  100 mg Oral Daily   amLODipine  10 mg Oral Daily   clonazepam  0.5 mg Oral BID   fluticasone furoate-vilanterol  1 puff Inhalation Daily   And   umeclidinium bromide  1 puff Inhalation Daily   gabapentin  300 mg Oral BID   insulin aspart  0-15 Units Subcutaneous TID WC   insulin aspart  0-5 Units Subcutaneous QHS   metoprolol succinate  25 mg Oral Daily   minocycline  200 mg Oral BID   mirtazapine  15 mg Oral QHS   pantoprazole  40 mg Oral Daily   sertraline  50 mg Oral Daily   tamsulosin  0.4 mg Oral Daily   thiamine  100 mg Oral Daily   traZODone  150 mg Oral QHS   Warfarin - Pharmacist Dosing Inpatient   Does not apply q1600    zinc sulfate  220 mg Oral Daily    Assessment: 62 yom who presented to clinic with concern for driveline fracture and infection. Last [facility] appointment was on 04/21/2023 - INR was subtherapeutic at 1.4 and regimen at that time was 6 mg daily except 4 mg Tues/Thurs (LD 7/25).  Given low INR on admit, started fixed rate heparin 500 units/hr until INR >1.8. Heparin level <0.1, CBC and LDH stable, INR remains low at 1.4. Debridements of DLI planned today.  Goal of Therapy:  Heparin level <0.3 units/ml INR 2-2.5  - keeping lower INR goal ~1.5 given plan for debridement upcoming week Monitor platelets by anticoagulation protocol: Yes   Plan:  Warfarin 6mg  x1 tonight   Continue heparin infusion at 500 units/hr >> (no up titrations unless discussed with MD) Monitor daily heparin level, INR, CBC, LDH, and for s/sx of bleeding   ADDENDUM 1430: Pt s/p I&D, ok to continue warfarin tonight and resume heparin at 1900 tonight per PVT.   [name], PharmD, BCPS, [facility] Clinical Pharmacist [phone] Please check AMION for all [facility] Pharmacy numbers 07/17/2023

## 2023-07-17 NOTE — Brief Op Note (Signed)
07/17/2023  2:25 PM  PATIENT:  Kevin Underwood.  62 y.o. male  PRE-OPERATIVE DIAGNOSIS:  VAD DRIVELIVE TUNNEL INFECTION  POST-OPERATIVE DIAGNOSIS: VAD  DRIVELIVE TUNNEL INFECTION  PROCEDURE:  Procedure(s): DRIVELINE WOUND DEBRIDEMENT (N/A) Excisional debridement of subcutaneous fat and abdominal fascia  SURGEON:  Surgeons and Role:    Lovett Sox, MD - Primary  PHYSICIAN ASSISTANT: RN  ASSISTANTS: none   ANESTHESIA:   MAC  EBL:  none  BLOOD ADMINISTERED:none  DRAINS: none   LOCAL MEDICATIONS USED:  NONE  SPECIMEN:  No Specimen  DISPOSITION OF SPECIMEN:  N/A  COUNTS:  YES  TOURNIQUET:  * No tourniquets in log *  DICTATION: .Dragon Dictation  PLAN OF CARE:  return to 2 C -11  PATIENT DISPOSITION:  PACU - hemodynamically stable.   Delay start of Pharmacological VTE agent (>24hrs) due to surgical blood loss or risk of bleeding- no, resume coumadin, resume iv heparin at 7 pm

## 2023-07-17 NOTE — Op Note (Signed)
NAME: Kevin Underwood, Kevin Underwood MEDICAL RECORD NO: [id number] ACCOUNT NO: [id number] DATE OF BIRTH: 31-Dec-1960 FACILITY: [facility] LOCATION: [facility] PHYSICIAN: Kerin Perna III, MD  Operative Report   DATE OF PROCEDURE: 07/17/2023  OPERATION: 1.  Excisional debridement of VAD tunnel wound (excision of abdominal wall subcutaneous fat and fascia). 2.  Pulse lavage irrigation of VAD tunnel wound with Vashe wound solution.  PREOPERATIVE DIAGNOSES:  History of HeartMate 3 implantation with MRSA infection of the VAD power cord tunnel.  POSTOPERATIVE DIAGNOSES:  History of HeartMate 3 implantation with MRSA infection of the VAD power cord tunnel.  SURGEON:  Kerin Perna, M.D.  ANESTHESIA:  MAC.  DESCRIPTION OF PROCEDURE:  The patient was brought from preoperative holding where informed consent was documented and final issues regarding the procedure were discussed in a face-to-face encounter with the patient.  The patient was brought to the  operating room by anesthesia and/or personnel and placed supine on the operating room table.  Under hemodynamic monitoring general anesthesia was induced with IV monitored conscious sedation.  The VAD coordinator was present with the patient during the  entire procedure to run the VAD equipment and to monitor and assist with management of hemodynamics.  The previously placed dressing was removed after the patient was positioned.  The chest and abdomen were then prepped and draped as a sterile field.  A proper timeout was performed.  The wound was examined.  The opening of the wound was approximately 3 x 3 cm and was approximately 2 cm deep.  The power cord was at the center of the wound.  There was some necrotic subcutaneous fat, which was excised as well as some fascia of the  rectus sheath.  Some of the velour coating of the power cord was carefully trimmed off to decrease the biofilm hazard.  After hemostasis was achieved and the wound was cleaned of the  chronically inflamed infected tissue as well as some fibrinous exudate the wound was irrigated with 2 liters of Vashe wound solution using pulse lavage.  The wound was then felt to look much  improved and was packed with Vashe soaked gauze and a wet-to-dry dressing and a sterile dressing was applied.  The anesthesia was stopped and the patient was then transported back to the recovery room for further monitoring and was accompanied by the VAD  coordinator postoperatively as well.   SHY D: 07/17/2023 5:06:48 pm T: 07/17/2023 9:10:00 pm  JOB: [id number]/ [id number]

## 2023-07-17 NOTE — Progress Notes (Signed)
[facility]    Date of Admission:  07/07/2023      ID: Kevin Underwood. is a 62 y.o. male with   Principal Problem:   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 (LVAD) (HCC)    Subjective: Afebrile. Awaiting debridement this afternoon by dr Zenaida Niece trigt  ROS: no abdominal pain, fever, chills, worsening drainage  Medications:   allopurinol  100 mg Oral Daily   amLODipine  10 mg Oral Daily   clonazepam  0.5 mg Oral BID   fluticasone furoate-vilanterol  1 puff Inhalation Daily   And   umeclidinium bromide  1 puff Inhalation Daily   gabapentin  300 mg Oral BID   insulin aspart  0-15 Units Subcutaneous TID WC   insulin aspart  0-5 Units Subcutaneous QHS   metoprolol succinate  25 mg Oral Daily   minocycline  200 mg Oral BID   mirtazapine  15 mg Oral QHS   pantoprazole  40 mg Oral Daily   sertraline  50 mg Oral Daily   tamsulosin  0.4 mg Oral Daily   thiamine  100 mg Oral Daily   traZODone  150 mg Oral QHS   warfarin  6 mg Oral ONCE-1600   Warfarin - Pharmacist Dosing Inpatient   Does not apply q1600   zinc sulfate  220 mg Oral Daily    Objective: Vital signs in last 24 hours: Temp:  [97.6 F (36.4 C)-98.3 F (36.8 C)] 97.6 F (36.4 C) (08/08 0754) Pulse Rate:  [87-90] 87 (08/08 0924) Resp:  [12-18] 12 (08/08 0754) BP: (102-130)/(77-99) 122/99 (08/08 0924) SpO2:  [95 %-100 %] 100 % (08/08 0754) Weight:  [91.1 kg] 91.1 kg (08/08 0500)  Physical Exam  Constitutional: He is oriented to person, place, and time. He appears well-developed and well-nourished. No distress.  HENT:  Mouth/Throat: Oropharynx is clear and moist. No oropharyngeal exudate.  Cardiovascular: Normal rate, regular rhythm and normal heart sounds. Exam reveals no gallop and no friction rub.  No murmur heard.  Pulmonary/Chest: Effort normal and breath sounds normal. No respiratory distress. He has no wheezes.  Abdominal: Soft. Bowel sounds are  normal. He exhibits no distension. There is no tenderness. +abdominal hernia Lymphadenopathy:  He has no cervical adenopathy.  Neurological: He is alert and oriented to person, place, and time.  Skin: Skin is warm and dry. No rash noted. No erythema.  Psychiatric: He has a normal mood and affect. His behavior is normal.    Lab Results Recent Labs    07/16/23 0528 07/17/23 0616  WBC 9.1 8.5  HGB 11.1* 11.2*  HCT 34.1* 34.0*  NA 135 137  K 4.2 4.1  CL 101 102  CO2 23 26  BUN 26* 25*  CREATININE 1.05 1.07     Microbiology: Susceptibility    Pseudomonas aeruginosa Methicillin resistant staphylococcus aureus    MIC MIC    CEFEPIME 2 SENSITIVE Sensitive      CEFTAZIDIME 4 SENSITIVE Sensitive      CIPROFLOXACIN 0.5 SENSITIVE Sensitive 4 RESISTANT Resistant    CLINDAMYCIN   <=0.25 SENS... Sensitive    ERYTHROMYCIN   >=8 RESISTANT Resistant    GENTAMICIN <=1 SENSITIVE Sensitive <=0.5 SENSI... Sensitive    IMIPENEM 1 SENSITIVE Sensitive      Inducible Clindamycin   NEGATIVE Sensitive    LINEZOLID   2 SENSITIVE Sensitive    OXACILLIN   >=4 RESISTANT Resistant    PIP/TAZO 16 SENSITIVE Sensitive  RIFAMPIN   <=0.5 SENSI... Sensitive    TETRACYCLINE   <=1 SENSITIVE Sensitive    TRIMETH/SULFA   >=320 RESIS... Resistant    VANCOMYCIN   <=0.5 SENSI... Sensitive    Studies/Results: DG Chest Port 1 View  Result Date: 07/16/2023 CLINICAL DATA:  Follow-up left ventricular assist device EXAM: PORTABLE CHEST 1 VIEW COMPARISON:  04/22/2023 FINDINGS: Previous median sternotomy. Left ventricular assist device appears the same by radiography. Atrial clip remains in place. Heart size is stable. The lungs show mild scarring but no infiltrate, collapse or edema. No pleural fluid. No acute bone finding. IMPRESSION: No change since the prior study. Left ventricular assist device remains in place without change or complicating feature by radiography. Electronically Signed   By: Paulina Fusi M.D.    On: 07/16/2023 08:21     Assessment/Plan: Drive line infection, polymicrobial = will continue with daptomycin and pseudomonas. We will plan to continue to treat for additional 4 wk with IV therapy after today's treatment, then transition to orals.  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 Professional time spent includes the following activities: Preparing to see the patient (review of tests), Obtaining and/or reviewing separately obtained history (admission/discharge record), Performing a medically appropriate examination and/or evaluation , Ordering medications/tests/procedures, referring and communicating with other health care professionals, Documenting clinical information in the EMR, Independently interpreting results (not separately reported), Communicating results to the patient/family/caregiver, Counseling and educating the patient/family/caregiver and Care coordination (not separately reported).     [facility] for Infectious Diseases Pager: [phone]  07/17/2023, 12:09 PM

## 2023-07-17 NOTE — Anesthesia Preprocedure Evaluation (Signed)
Anesthesia Evaluation  Patient identified by MRN, date of birth, ID band Patient awake    Reviewed: Allergy & Precautions, NPO status , Patient's Chart, lab work & pertinent test results, reviewed documented beta blocker date and time   History of Anesthesia Complications Negative for: history of anesthetic complications  Airway Mallampati: II  TM Distance: >3 FB Neck ROM: Full    Dental  (+) Edentulous Lower, Edentulous Upper   Pulmonary sleep apnea , COPD, former smoker   breath sounds clear to auscultation       Cardiovascular hypertension, + angina  + CAD and +CHF   Rhythm:Regular Rate:Normal  LVAD   Neuro/Psych neg Seizures PSYCHIATRIC DISORDERS  Depression       GI/Hepatic   Endo/Other  diabetes Hyperthyroidism   Renal/GU Renal disease     Musculoskeletal  (+) Arthritis ,    Abdominal   Peds  Hematology   Anesthesia Other Findings   Reproductive/Obstetrics                             Anesthesia Physical Anesthesia Plan  ASA: 4  Anesthesia Plan: General and MAC   Post-op Pain Management:    Induction:   PONV Risk Score and Plan: 1 and Ondansetron  Airway Management Planned: Nasal Cannula  Additional Equipment:   Intra-op Plan:   Post-operative Plan:   Informed Consent:      Dental advisory given  Plan Discussed with: Surgeon  Anesthesia Plan Comments:        Anesthesia Quick Evaluation

## 2023-07-17 NOTE — Progress Notes (Signed)
VAD Coordinator Procedure Note:   VAD Coordinator met patient in SS 37. Pt undergoing driveline debridement per Dr. Donata Clay. Hemodynamics and VAD parameters monitored by myself and anesthesia throughout the procedure. Blood pressures were obtained with automatic cuff on left arm.    Time: Doppler Auto  BP Flow PI Power Speed  Pre-procedure:  1338  138/100 (111)` 3.8 4.9 4.1 5600                    Sedation Induction: 1343  85/75 (81) 4.7 2.1 4.1 5600   1350  81/71 (76) 4.6 1.5 4.2 5600   1400  118/95( 104) 4.6 2.5 4.1 5600   1410  119/53(73) 4.3 3.2 3.9 5600  Recovery Area: 1423  108/88(96) 4.4 2.7 4.1 5600   1430  98/82(90) 4.5 2.5 4.2 5600    Patient tolerated the procedure well. VAD Coordinator accompanied and remained with patient in recovery area.    Patient Disposition: Pt transported to [facility] on batteries and handoff given to [place], Charity fundraiser.   Carlton Adam RN, BSN VAD Coordinator 24/7 Pager [phone]

## 2023-07-17 NOTE — Progress Notes (Signed)
Pre Procedure note for inpatients:   Kevin Underwood. has been scheduled for Procedure(s): DRIVELINE WOUND DEBRIDEMENT (N/A) APPLICATION OF WOUND VAC (N/A) today. The various methods of treatment have been discussed with the patient. After consideration of the risks, benefits and treatment options the patient has consented to the planned procedure.   The patient has been seen and labs reviewed. There are no changes in the patient's condition to prevent proceeding with the planned procedure today.  Recent labs:  Lab Results  Component Value Date   WBC 8.5 07/17/2023   HGB 11.2 (L) 07/17/2023   HCT 34.0 (L) 07/17/2023   PLT 192 07/17/2023   GLUCOSE 215 (H) 07/17/2023   CHOL 175 07/09/2023   TRIG 193 (H) 07/09/2023   HDL 45 07/09/2023   LDLCALC 91 07/09/2023   ALT 24 07/07/2023   AST 17 07/07/2023   NA 137 07/17/2023   K 4.1 07/17/2023   CL 102 07/17/2023   CREATININE 1.07 07/17/2023   BUN 25 (H) 07/17/2023   CO2 26 07/17/2023   TSH 7.452 (H) 07/07/2023   INR 1.4 (H) 07/17/2023   HGBA1C 6.3 (H) 07/09/2023    Lovett Sox, MD 07/17/2023 8:44 AM

## 2023-07-17 NOTE — Plan of Care (Signed)
  Problem: Education: Goal: Patient will understand all VAD equipment and how it functions Outcome: Progressing Goal: Patient will be able to verbalize current INR target range and antiplatelet therapy for discharge home Outcome: Progressing   Problem: Cardiac: Goal: LVAD will function as expected and patient will experience no clinical alarms Outcome: Progressing   Problem: Education: Goal: Knowledge of General Education information will improve Description: Including pain rating scale, medication(s)/side effects and non-pharmacologic comfort measures Outcome: Progressing   Problem: Health Behavior/Discharge Planning: Goal: Ability to manage health-related needs will improve Outcome: Progressing   Problem: Clinical Measurements: Goal: Ability to maintain clinical measurements within normal limits will improve Outcome: Progressing Goal: Will remain free from infection Outcome: Progressing Goal: Diagnostic test results will improve Outcome: Progressing Goal: Respiratory complications will improve Outcome: Progressing Goal: Cardiovascular complication will be avoided Outcome: Progressing   Problem: Activity: Goal: Risk for activity intolerance will decrease Outcome: Progressing   Problem: Coping: Goal: Level of anxiety will decrease Outcome: Progressing   Problem: Elimination: Goal: Will not experience complications related to bowel motility Outcome: Progressing Goal: Will not experience complications related to urinary retention Outcome: Progressing   Problem: Pain Managment: Goal: General experience of comfort will improve Outcome: Progressing   Problem: Safety: Goal: Ability to remain free from injury will improve Outcome: Progressing   Problem: Skin Integrity: Goal: Risk for impaired skin integrity will decrease Outcome: Progressing   Problem: Education: Goal: Ability to describe self-care measures that may prevent or decrease complications (Diabetes  Survival Skills Education) will improve Outcome: Progressing Goal: Individualized Educational Video(s) Outcome: Progressing   Problem: Coping: Goal: Ability to adjust to condition or change in health will improve Outcome: Progressing   Problem: Fluid Volume: Goal: Ability to maintain a balanced intake and output will improve Outcome: Progressing   Problem: Health Behavior/Discharge Planning: Goal: Ability to identify and utilize available resources and services will improve Outcome: Progressing Goal: Ability to manage health-related needs will improve Outcome: Progressing

## 2023-07-17 NOTE — Plan of Care (Signed)
  Problem: Education: Goal: Patient will understand all VAD equipment and how it functions Outcome: Progressing   Problem: Cardiac: Goal: LVAD will function as expected and patient will experience no clinical alarms Outcome: Progressing   Problem: Education: Goal: Knowledge of General Education information will improve Description: Including pain rating scale, medication(s)/side effects and non-pharmacologic comfort measures Outcome: Progressing   Problem: Health Behavior/Discharge Planning: Goal: Ability to manage health-related needs will improve Outcome: Progressing   Problem: Clinical Measurements: Goal: Ability to maintain clinical measurements within normal limits will improve Outcome: Progressing Goal: Will remain free from infection Outcome: Progressing Goal: Diagnostic test results will improve Outcome: Progressing   Problem: Nutrition: Goal: Adequate nutrition will be maintained Outcome: Progressing   Problem: Coping: Goal: Level of anxiety will decrease Outcome: Progressing   Problem: Pain Managment: Goal: General experience of comfort will improve Outcome: Progressing

## 2023-07-17 NOTE — Progress Notes (Addendum)
Advanced Heart Failure VAD Team Note  PCP-Cardiologist: Nona Dell, MD  [facility]: Dr. Shirlee Latch   Subjective:    7/29: admitted w/ suspected DL fracture and DL infection. P/w "driveline communication fault" error message on LVAD. No pump stoppages. LVAD coordinator replaced modular cable and controller with resolution of driveline communication fault signal. CT C/A/P c/w prior imaging w/ similar appearance of mild circumferential thickening of the fat plane surrounding the drive line in the upper abdomen extending from the pump to the skin which may be chronic or represent an infectious process. No drainable fluid collection. Wound + BCx collected. Started on empiric daptomycin/Unasyn per ID.  7/30: Wound Cx growing Pseudomonas Aeruginosa (pan-sensitive) and S. aureus. Cefepime added 7/31: OR for wound debridement + application of wound vac.    Remains on Dapto, Cefepime and minocycline. Surgical wound with pseudomonas aeruginosa and MRSA. Bcx from 7/29 and 7/31 NG. AF. WBC WNL.   INR 1.4. Remains on heparin gtt. Returning to OR today for I&D  Feels fine this morning just hungry. Ambulating.   LVAD INTERROGATION:  HeartMate III LVAD:   Flow 4.4 liters/min, speed 5600, power 4.4, PI 2.8. unable to fully interrogate LVAD as disconnected from monitor  Objective:    Vital Signs:   Temp:  [97.6 F (36.4 C)-98.3 F (36.8 C)] 97.6 F (36.4 C) (08/08 0500) Pulse Rate:  [87-90] 90 (08/08 0500) Resp:  [15-19] 15 (08/08 0500) BP: (102-130)/(77-95) 102/86 (08/08 0500) SpO2:  [93 %-97 %] 95 % (08/08 0500) Weight:  [91.1 kg] 91.1 kg (08/08 0500) Last BM Date : 07/15/23 Mean arterial Pressure 80s-90s  Intake/Output:   Intake/Output Summary (Last 24 hours) at 07/17/2023 0721 Last data filed at 07/17/2023 0540 Gross per 24 hour  Intake 726.29 ml  Output 1400 ml  Net -673.71 ml     Physical Exam  General:  Well appearing. No resp difficulty HEENT: Normal Neck: supple. JVP ~6. Carotids 2+  bilat; no bruits. No lymphadenopathy or thyromegaly appreciated. Cor: Mechanical heart sounds with LVAD hum present. Lungs: Clear Abdomen: soft, nontender, nondistended. No hepatosplenomegaly. No bruits or masses. Good bowel sounds. Large protruding abdominal hernia.  Driveline: C/D/I; securement device intact. Dressing around DL exit site.  Extremities: no cyanosis, clubbing, rash, edema Neuro: alert & orientedx3, cranial nerves grossly intact. moves all 4 extremities w/o difficulty. Affect pleasant  Telemetry   NSR 90s (Personally reviewed)    EKG    N/A   Labs   Basic Metabolic Panel: Recent Labs  Lab 07/12/23 0046 07/13/23 0114 07/14/23 0532 07/15/23 0527 07/16/23 0528  NA 135 135 136 135 135  K 3.8 3.7 4.3 3.9 4.2  CL 99 101 104 101 101  CO2 24 22 23 22 23   GLUCOSE 227* 166* 200* 190* 189*  BUN 26* 27* 25* 22 26*  CREATININE 1.13 1.16 1.13 1.00 1.05  CALCIUM 8.8* 8.7* 9.0 9.1 9.1    Liver Function Tests: No results for input(s): "AST", "ALT", "ALKPHOS", "BILITOT", "PROT", "ALBUMIN" in the last 168 hours.  CBC: Recent Labs  Lab 07/12/23 0046 07/14/23 0532 07/15/23 0527 07/16/23 0528 07/17/23 0616  WBC 8.4 9.5 10.2 9.1 8.5  HGB 10.9* 11.8* 11.6* 11.1* 11.2*  HCT 33.3* 36.1* 35.6* 34.1* 34.0*  MCV 85.6 83.8 84.0 84.8 85.4  PLT 202 219 230 199 192    INR: Recent Labs  Lab 07/13/23 0120 07/14/23 0532 07/15/23 0527 07/16/23 0528 07/17/23 0616  INR 1.3* 1.3* 1.4* 1.5* 1.4*    Other results: EKG:  Imaging   DG Chest Port 1 View  Result Date: 07/16/2023 CLINICAL DATA:  Follow-up left ventricular assist device EXAM: PORTABLE CHEST 1 VIEW COMPARISON:  04/22/2023 FINDINGS: Previous median sternotomy. Left ventricular assist device appears the same by radiography. Atrial clip remains in place. Heart size is stable. The lungs show mild scarring but no infiltrate, collapse or edema. No pleural fluid. No acute bone finding. IMPRESSION: No change since the  prior study. Left ventricular assist device remains in place without change or complicating feature by radiography. Electronically Signed   By: Paulina Fusi M.D.   On: 07/16/2023 08:21     Medications:     Scheduled Medications:  allopurinol  100 mg Oral Daily   amLODipine  10 mg Oral Daily   clonazepam  0.5 mg Oral BID   fluticasone furoate-vilanterol  1 puff Inhalation Daily   And   umeclidinium bromide  1 puff Inhalation Daily   gabapentin  300 mg Oral BID   insulin aspart  0-15 Units Subcutaneous TID WC   insulin aspart  0-5 Units Subcutaneous QHS   metoprolol succinate  25 mg Oral Daily   minocycline  200 mg Oral BID   mirtazapine  15 mg Oral QHS   pantoprazole  40 mg Oral Daily   sertraline  50 mg Oral Daily   tamsulosin  0.4 mg Oral Daily   thiamine  100 mg Oral Daily   traZODone  150 mg Oral QHS   Warfarin - Pharmacist Dosing Inpatient   Does not apply q1600   zinc sulfate  220 mg Oral Daily    Infusions:  ceFEPime (MAXIPIME) IV 2 g (07/17/23 2841)   DAPTOmycin (CUBICIN) 800 mg in sodium chloride 0.9 % IVPB Stopped (07/16/23 1356)   heparin 500 Units/hr (07/17/23 0540)    PRN Medications: acetaminophen, fluticasone   Patient Profile  62 y.o. 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 Now s/p Heartmate 3 LVAD. Now admitted w/ suspected DL fracture after multiple falls and recurrent DL infection.  Assessment/Plan:   1.  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  Cultures have grown acinetobacter and MRSA. He was readmitted with driveline infection in 2/24, completed course of daptomycin for MRSA. DL site with new green/yellow drainage. Now readmitted w/ suspected DL fracture and DL  infection. P/w "driveline communication fault" error message on LVAD. No pump stoppages. Also w/ significant greenish drainage from DL, in setting of poor compliance w/ suppressive minocycline. Recent fall ~2 wks prior, likely cause of DL trauma. LVAD coordinator replaced modular cable and controller with resolution of driveline communication fault signal. CT C/A/P c/w prior imaging w/ similar appearance of mild circumferential thickening of the fat plane surrounding the drive line in the upper abdomen extending from the pump to the skin which may be chronic or represent an infectious process. No drainable fluid collection. Wound + BCx collected. Started on empiric daptomycin/Unasyn per ID. Initial wound Cx + for Pseudomonas Aeruginosa (pan-sensitive) and S. aureus. Cefepime added. Now on Dapto, Cefepime and minocycline. BCx NGTD. Taken to OR 7/31 for wound debridement + application of wound vac - continue abx per ID. Appreciate their assistance. ID considering 4 weeks of daptomycin then Q 3 wks dalbavancin. Would have significant concern  about him receiving IV abx at home with history of drug use and noncompliance. Would likely need to discharge to SNF if needs IV abx after discharge but he does not want to return to SNF. - Back to OR 8/7 for wound wash out/debridement  2. Suspected DL fracture with multiple recent falls - Has had multiple falls during the last 2 months, one direct trauma to mid sternum (fell on cinder block) - CT chest/abd/pelvis per above  - CXR w/ LVAD partially imaged. The associated lead noted to be intact w/ no fracture or kink  - LVAD coordinators changed modular cable and controller  3.  Chronic systolic CHF: Echo 10/21 with EF 20-25%, mildly decreased RV function. LHC/RHC in 12/21 with patent grafts, low output. Suspect mixed ischemic/nonischemic cardiomyopathy (prior heavy ETOH and drugs as well as CAD). Admitted with cardiogenic shock in 12/21, had placement of Impella 5.5  initially, now s/p Heartmate 3 LVAD on 11/17/20.  Ramp echo 2/23 with speed decreased to 5500 rpm. Speed now back to 5600 rpm.   - He is not volume overloaded on exam.      - Continue current Toprol XL 25 mg daily.  - He does not need a diuretic.   - INR 1.1 on admission. Suspect noncompliant with Warfarin. - INR 1.4 today; continue heparin gtt   4. CKD stage 3: Prior CTA abdomen/pelvis with high-grade stenosis of proximal right renal artery.  - Would consider eventual reassessment of renal artery stenosis and possible treatment  - SCr stable   5.  CAD: S/p CABG 12/20.  LHC pre-VAD with patent grafts, no target for intervention. - Continue Crestor 20 mg daily.   6.  Atrial fibrillation: Paroxysmal.  S/p Maze and LA appendage clip with CABG in 12/20. Maintaining SR.  - Continue Toprol XL 25 mg daily.  - Off amiodarone with hyperthyroidism.  7. Type 2 diabetes: improved control, Hgb A1c down from 12.1>>6.3  - continue SSI   8. 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 He says last use of methamphetamine was > 2 months ago. - UDS this admit negative (however collected > 48 hrs post admit)  - Declines substance use resources  9. 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  Followed by endocrinology. He is now off amiodarone. He had been on methimazole but has not been taking recently. TSH was high on admit and free T4 normal  - Will NOT restart methimazole   10. Anemia (chronic blood loss):  Hgb stable. No gross bleeding.  - follow CBP   11. COPD: Prior smoker.  - Continue Trelegy.   12. PVCs/NSVT:  - Now off amiodarone.   13. Depression: Has turned to substance abuse to cope.   - Has been referred to OP psych in [place]   14.  Gout: Continue allopurinol, he has prn colchicine.   15. HTN: MAPs improving. In setting of known Rt RAS. Hold off on ACE/ARB. - monitor closely, if remains elevated may need reassessment of renal artery stenosis and possible  treatment. Significant calcifications in both renal arteries on CT abdomen this admit. - Continue Amlodipine 10 mg   16. Agitation: - Klonopin increased, agitation improved  17. Incisional hernia: -Continue abdominal binder  Plan to return to OR today for the last time. Plan for wound care at LVAD clinic. Once INR therapeutic stable for discharge. Plan to complete at least 14 days IV abx while IP per Dr. Maren Beach.     I reviewed the LVAD parameters  from today, and compared the results to the patient's prior recorded data.  No programming changes were made.  The LVAD is functioning within specified parameters.  The patient performs LVAD self-test daily.  LVAD interrogation was negative for any significant power changes, alarms or PI events/speed drops.  LVAD equipment check completed and is in good working order.  Back-up equipment present.   LVAD education done on emergency procedures and precautions and reviewed exit site care.  Length of Stay: 10  Alen Bleacher, NP 07/17/2023, 7:21 AM  VAD Team --- VAD ISSUES ONLY--- Pager [phone] (7am - 7am)  Advanced Heart Failure Team  Pager [phone] (M-F; 7a - 5p)  Please contact [facility] for night-coverage after hours (5p -7a ) and weekends on [email or web address]  Patient seen and examined with the above-signed Advanced Practice Provider and/or Housestaff. I personally reviewed laboratory data, imaging studies and relevant notes. I independently examined the patient and formulated the important aspects of the plan. I have edited the note to reflect any of my changes or salient points. I have personally discussed the plan with the patient and/or family.  Feels ok. Frustrated at times. Remains on IV abx. No f/c.   INR 1.4. On heparin gtt. No bleeding  For OR today.   General:  NAD.  HEENT: normal  Neck: supple. JVP not elevated.  Carotids 2+ bilat; no bruits. No lymphadenopathy or thryomegaly appreciated. Cor: LVAD hum.  Lungs: Clear. Abdomen:  soft, nontender, non-distended. No hepatosplenomegaly. No bruits or masses. Good bowel sounds. Wound dressing in place. Wearing ab binder Extremities: no cyanosis, clubbing, rash. Warm no edema  Neuro: alert & oriented x 3. No focal deficits. Moves all 4 without problem   For repeat I&D of driveline wound site today. Continue IV abx.   Continue IV heaprin. INR 1.4. Discussed dosing with PharmD personally.  VAD interrogated personally. Parameters stable.  Continue to watch for WD symptoms.   Arvilla Meres, MD  8:47 AM

## 2023-07-18 DIAGNOSIS — T827XXA Infection and inflammatory reaction due to other cardiac and vascular devices, implants and grafts, initial encounter: Secondary | ICD-10-CM | POA: Diagnosis not present

## 2023-07-18 LAB — GLUCOSE, CAPILLARY
Glucose-Capillary: 221 mg/dL — ABNORMAL HIGH (ref 70–99)
Glucose-Capillary: 203 mg/dL — ABNORMAL HIGH (ref 70–99)
Glucose-Capillary: 203 mg/dL — ABNORMAL HIGH (ref 70–99)
Glucose-Capillary: 198 mg/dL — ABNORMAL HIGH (ref 70–99)

## 2023-07-18 LAB — HEPARIN LEVEL (UNFRACTIONATED): Heparin Unfractionated: <0.1 IU/mL — ABNORMAL LOW (ref 0.30–0.70)

## 2023-07-18 MED ORDER — WARFARIN SODIUM 7.5 MG PO TABS
7.5000 mg | ORAL_TABLET | Freq: Once | ORAL | Status: AC
  Administered 2023-07-18: 7.5 mg via ORAL
  Filled 2023-07-18: qty 1

## 2023-07-18 MED ORDER — LOSARTAN POTASSIUM 25 MG PO TABS
12.5000 mg | ORAL_TABLET | Freq: Every day | ORAL | Status: DC
  Administered 2023-07-18 – 2023-07-20 (×3): 12.5 mg via ORAL
  Filled 2023-07-18 (×3): qty 1

## 2023-07-18 NOTE — Progress Notes (Addendum)
[facility]  Date of Admission:  07/07/2023      Total days of antibiotics 11   Daptomycin   Cefepime   Minocycline          ASSESSMENT: Kevin Underwood. is a 62 y.o. 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  -OR cultures also growing MRSA again also  -would prefer for 4 week induction with IV antibiotics but will reassess for readiness to switch to orals at 2 weeks week of 8/23.  -Continue daptomycin and cefepime IV -Continue Minocycline PO 200 mg BID   Please contact ID team on call in 2 wks to reassess patient, we will sign off for now.  Disposition -  -He has no interest in going back to [facility] -Would not feel very comfortable with home PICC Line in the setting of substance use and difficulty getting him to clinic for follow ups.    Medication Monitoring -  -minimum safety labs while inpatient would be weekly CK, CBC, BMP    PLAN: Would prefer for 4 week induction with IV antibiotics but will reassess for readiness to switch to orals at 2 weeks week of 8/23.  Will see him back around then to determine next steps and check in remotely until then - please call us back sooner if he plans to leave prematurely     Principal Problem:   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 (LVAD) (HCC)    allopurinol  100 mg Oral Daily   amLODipine  10 mg Oral Daily   clonazepam  0.5 mg Oral BID   fluticasone furoate-vilanterol  1 puff Inhalation Daily   And   umeclidinium bromide  1 puff Inhalation Daily   gabapentin  300 mg Oral BID   insulin aspart  0-15 Units Subcutaneous TID WC   insulin aspart  0-5 Units Subcutaneous QHS   losartan  12.5 mg Oral Daily   metoprolol  succinate  25 mg Oral Daily   minocycline  200 mg Oral BID   mirtazapine  15 mg Oral QHS   pantoprazole  40 mg Oral Daily   sertraline  50 mg Oral Daily   tamsulosin  0.4 mg Oral Daily   thiamine  100 mg Oral Daily   traZODone  150 mg Oral QHS   warfarin  7.5 mg Oral ONCE-1600   Warfarin - Pharmacist Dosing Inpatient   Does not apply q1600   zinc sulfate  220 mg Oral Daily    SUBJECTIVE: Doing well today. Just had dressing changed with Maralyn Sago.    Review of Systems: Review of Systems  Constitutional:  Negative for chills, fever, malaise/fatigue and weight loss.  HENT:  Negative for sore throat.   Respiratory:  Negative for cough, sputum production and shortness of breath.   Cardiovascular: Negative.   Gastrointestinal:  Negative for abdominal pain, diarrhea and vomiting.  Musculoskeletal:  Negative for joint pain, myalgias and neck pain.  Skin:  Negative for rash.  Neurological:  Negative for headaches.  Psychiatric/Behavioral:  Negative for depression and substance abuse. The patient is not nervous/anxious.      Allergies  Allergen Reactions   Vancomycin Nausea And Vomiting and Other (See Comments)  Severe joint pain    OBJECTIVE: Vitals:   07/18/23 0820 07/18/23 0839 07/18/23 1035 07/18/23 1124  BP: (!) 129/93  (!) 131/93 (!) 122/92  Pulse: 86  84 93  Resp: 18   20  Temp: 98 F (36.7 C)   97.6 F (36.4 C)  TempSrc: Oral   Oral  SpO2: 98% 97%  97%  Weight:      Height:       Body mass index is 28.85 kg/m.  Physical Exam Vitals reviewed.  Cardiovascular:     Rate and Rhythm: Normal rate and regular rhythm.     Comments: +lvad hum  Abdominal:     Comments: Large hernia defect protruding out. Non painful.  Driveline drainage noted to have mostly serosanguinous but some thicker discharge that is white/tan.   Neurological:     Mental Status: He is oriented to person, place, and time.     Lab Results Lab Results  Component Value Date   WBC 8.0  07/18/2023   HGB 11.2 (L) 07/18/2023   HCT 34.6 (L) 07/18/2023   MCV 85.6 07/18/2023   PLT 189 07/18/2023    Lab Results  Component Value Date   CREATININE 1.06 07/18/2023   BUN 28 (H) 07/18/2023   NA 135 07/18/2023   K 4.6 07/18/2023   CL 98 07/18/2023   CO2 26 07/18/2023    Lab Results  Component Value Date   ALT 24 07/07/2023   AST 17 07/07/2023   ALKPHOS 93 07/07/2023   BILITOT 0.3 07/07/2023     Microbiology: Recent Results (from the past 240 hour(s))  Fungus Culture With Stain     Status: None (Preliminary result)   Collection Time: 07/09/23 11:39 AM   Specimen: Path fluid; Body Fluid  Result Value Ref Range Status   Fungus Stain Final report  Final    Comment: (NOTE) Performed At: [facility] [address] [place], [place] [id number] Jolene Schimke MD WU:[id number]    Fungus (Mycology) Culture PENDING  Incomplete   Fungal Source ABSCESS  Final    Comment: Performed at [facility], [address] [place], [place] [address]  Aerobic/Anaerobic Culture w Gram Stain (surgical/deep wound)     Status: None (Preliminary result)   Collection Time: 07/09/23 11:39 AM   Specimen: Path fluid; Body Fluid  Result Value Ref Range Status   Specimen Description ABSCESS  Final   Special Requests DRIVELINE TUNNEL INFECTION  Final   Gram Stain   Final    NO WBC SEEN RARE GRAM POSITIVE COCCI IN PAIRS IN TETRADS    Culture   Final    FEW PSEUDOMONAS AERUGINOSA RARE METHICILLIN RESISTANT STAPHYLOCOCCUS AUREUS NO ANAEROBES ISOLATED Sent to [facility] for further susceptibility testing. Performed at [facility], [address], [place], [place] [address]    Report Status PENDING  Incomplete   Organism ID, Bacteria PSEUDOMONAS AERUGINOSA  Final   Organism ID, Bacteria METHICILLIN RESISTANT STAPHYLOCOCCUS AUREUS  Final      Susceptibility   Methicillin resistant staphylococcus aureus - MIC*    CIPROFLOXACIN 4 RESISTANT Resistant     ERYTHROMYCIN >=8  RESISTANT Resistant     GENTAMICIN <=0.5 SENSITIVE Sensitive     OXACILLIN >=4 RESISTANT Resistant     TETRACYCLINE <=1 SENSITIVE Sensitive     VANCOMYCIN <=0.5 SENSITIVE Sensitive     TRIMETH/SULFA >=320 RESISTANT Resistant     CLINDAMYCIN <=0.25 SENSITIVE Sensitive     RIFAMPIN <=0.5 SENSITIVE Sensitive  Inducible Clindamycin NEGATIVE Sensitive     LINEZOLID 2 SENSITIVE Sensitive     * RARE METHICILLIN RESISTANT STAPHYLOCOCCUS AUREUS   Pseudomonas aeruginosa - MIC*    CEFTAZIDIME 4 SENSITIVE Sensitive     CIPROFLOXACIN 0.5 SENSITIVE Sensitive     GENTAMICIN <=1 SENSITIVE Sensitive     IMIPENEM 1 SENSITIVE Sensitive     PIP/TAZO 16 SENSITIVE Sensitive     CEFEPIME 2 SENSITIVE Sensitive     * FEW PSEUDOMONAS AERUGINOSA  Acid Fast Smear (AFB)     Status: None   Collection Time: 07/09/23 11:39 AM   Specimen: Path fluid; Body Fluid  Result Value Ref Range Status   AFB Specimen Processing Concentration  Final   Acid Fast Smear Negative  Final    Comment: (NOTE) Performed At: [facility] [address] [place], [place] [id number] Jolene Schimke MD WE:[id number]    Source (AFB) ABSCESS  Final    Comment: Performed at [facility], [address], [place], [place] [address]  Fungus Culture Result     Status: None   Collection Time: 07/09/23 11:39 AM  Result Value Ref Range Status   Result 1 Comment  Final    Comment: (NOTE) KOH/Calcofluor preparation:  no fungus observed. Performed At: [facility] [address] [place], [place] [id number] Jolene Schimke MD ID:[id number]   Culture, blood (Routine X 2) w Reflex to ID Panel     Status: None   Collection Time: 07/09/23  3:47 PM   Specimen: BLOOD RIGHT ARM  Result Value Ref Range Status   Specimen Description BLOOD RIGHT ARM  Final   Special Requests   Final    BOTTLES DRAWN AEROBIC AND ANAEROBIC Blood Culture adequate volume   Culture   Final    NO GROWTH 5 DAYS Performed at [facility],  [address], [place], [place] [address]    Report Status 07/14/2023 FINAL  Final  Culture, blood (Routine X 2) w Reflex to ID Panel     Status: None   Collection Time: 07/09/23  3:47 PM   Specimen: BLOOD LEFT ARM  Result Value Ref Range Status   Specimen Description BLOOD LEFT ARM  Final   Special Requests   Final    BOTTLES DRAWN AEROBIC AND ANAEROBIC Blood Culture adequate volume   Culture   Final    NO GROWTH 5 DAYS Performed at [facility], [address] [place], [place] [address]    Report Status 07/14/2023 FINAL  Final    Kevin Alberts, MSN, NP-C [facility] Pager: [phone]  @TODAY @ 2:12 PM   ---------- Patient was seen, examined,treatment plan was discussed with stephanie dixon, and myself.  I have personally reviewed the clinical findings, labs, imaging studies and management of this patient in detail.  I agree with the documentation, as recorded by the Advance Practice Provider.    Duke Salvia Drue Second MD MPH [facility] [phone]

## 2023-07-18 NOTE — Progress Notes (Addendum)
Advanced Heart Failure VAD Team Note  PCP-Cardiologist: Nona Dell, MD  [facility]: Dr. Shirlee Latch   Subjective:    7/29: admitted w/ suspected DL fracture and DL infection. P/w "driveline communication fault" error message on LVAD. No pump stoppages. LVAD coordinator replaced modular cable and controller with resolution of driveline communication fault signal. CT C/A/P c/w prior imaging w/ similar appearance of mild circumferential thickening of the fat plane surrounding the drive line in the upper abdomen extending from the pump to the skin which may be chronic or represent an infectious process. No drainable fluid collection. Wound + BCx collected. Started on empiric daptomycin/Unasyn per ID.  7/30: Wound Cx growing Pseudomonas Aeruginosa (pan-sensitive) and S. aureus. Cefepime added 7/31: OR for wound debridement + application of wound vac.   8/8: Returned to OR for I&D  Remains on Dapto, Cefepime and minocycline. Surgical wound with pseudomonas aeruginosa and MRSA. Bcx from 7/29 and 7/31 NG. AF. WBC WNL.   INR 1.4. Remains on heparin gtt.   Feels fine this morning. Sitting on EOB.   LVAD INTERROGATION:  HeartMate III LVAD:   Flow 4.6 liters/min, speed 5600, power 4.3, PI 2.5. unable to fully interrogate LVAD as disconnected from monitor  Objective:    Vital Signs:   Temp:  [97.6 F (36.4 C)-98.6 F (37 C)] 97.7 F (36.5 C) (08/09 0608) Pulse Rate:  [76-92] 84 (08/09 0608) Resp:  [11-22] 20 (08/09 0608) BP: (98-123)/(78-99) 116/98 (08/09 0608) SpO2:  [95 %-100 %] 99 % (08/09 0608) Weight:  [91.2 kg-92 kg] 91.2 kg (08/09 0500) Last BM Date : 07/17/23 Mean arterial Pressure 100  Intake/Output:   Intake/Output Summary (Last 24 hours) at 07/18/2023 0726 Last data filed at 07/17/2023 2325 Gross per 24 hour  Intake 632.1 ml  Output 400 ml  Net 232.1 ml     Physical Exam  General:  Well appearing. No resp difficulty HEENT: Normal Neck: supple. JVP flat. Carotids 2+ bilat; no  bruits. No lymphadenopathy or thyromegaly appreciated. Cor: Mechanical heart sounds with LVAD hum present. Lungs: Clear Abdomen: soft, nontender, nondistended. No hepatosplenomegaly. No bruits or masses. Good bowel sounds. Large protruding abdominal hernia.  Driveline: C/D/I; securement device intact and driveline incorporated.  Dressing around DL site Extremities: no cyanosis, clubbing, rash, edema Neuro: alert & orientedx3, cranial nerves grossly intact. moves all 4 extremities w/o difficulty. Affect pleasant  Telemetry   NSR 80s with PVCs (Personally reviewed)    EKG    N/A   Labs   Basic Metabolic Panel: Recent Labs  Lab 07/14/23 0532 07/15/23 0527 07/16/23 0528 07/17/23 0616 07/18/23 0621  NA 136 135 135 137 135  K 4.3 3.9 4.2 4.1 4.6  CL 104 101 101 102 98  CO2 23 22 23 26 26   GLUCOSE 200* 190* 189* 215* 232*  BUN 25* 22 26* 25* 28*  CREATININE 1.13 1.00 1.05 1.07 1.06  CALCIUM 9.0 9.1 9.1 9.1 9.3    Liver Function Tests: No results for input(s): "AST", "ALT", "ALKPHOS", "BILITOT", "PROT", "ALBUMIN" in the last 168 hours.  CBC: Recent Labs  Lab 07/14/23 0532 07/15/23 0527 07/16/23 0528 07/17/23 0616 07/18/23 0621  WBC 9.5 10.2 9.1 8.5 8.0  HGB 11.8* 11.6* 11.1* 11.2* 11.2*  HCT 36.1* 35.6* 34.1* 34.0* 34.6*  MCV 83.8 84.0 84.8 85.4 85.6  PLT 219 230 199 192 189    INR: Recent Labs  Lab 07/14/23 0532 07/15/23 0527 07/16/23 0528 07/17/23 0616 07/18/23 0621  INR 1.3* 1.4* 1.5* 1.4* 1.4*  Other results: EKG:    Imaging   No results found.   Medications:     Scheduled Medications:  allopurinol  100 mg Oral Daily   amLODipine  10 mg Oral Daily   clonazepam  0.5 mg Oral BID   fluticasone furoate-vilanterol  1 puff Inhalation Daily   And   umeclidinium bromide  1 puff Inhalation Daily   gabapentin  300 mg Oral BID   insulin aspart  0-15 Units Subcutaneous TID WC   insulin aspart  0-5 Units Subcutaneous QHS   metoprolol succinate   25 mg Oral Daily   minocycline  200 mg Oral BID   mirtazapine  15 mg Oral QHS   pantoprazole  40 mg Oral Daily   sertraline  50 mg Oral Daily   tamsulosin  0.4 mg Oral Daily   thiamine  100 mg Oral Daily   traZODone  150 mg Oral QHS   Warfarin - Pharmacist Dosing Inpatient   Does not apply q1600   zinc sulfate  220 mg Oral Daily    Infusions:  ceFEPime (MAXIPIME) IV 2 g (07/18/23 4098)   DAPTOmycin (CUBICIN) 800 mg in sodium chloride 0.9 % IVPB Stopped (07/17/23 1932)   heparin 500 Units/hr (07/17/23 2325)    PRN Medications: acetaminophen, fluticasone, HYDROcodone-acetaminophen   Patient Profile  62 y.o. 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 Now s/p Heartmate 3 LVAD. Now admitted w/ suspected DL fracture after multiple falls and recurrent DL infection.  Assessment/Plan:   1.  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  Cultures have grown acinetobacter and MRSA. He was readmitted with driveline infection in 2/24, completed course of daptomycin for MRSA. DL site with new green/yellow drainage. Now readmitted w/ suspected DL fracture and DL infection. P/w "driveline communication fault" error message on LVAD. No pump stoppages. Also w/ significant greenish drainage from DL, in setting of poor compliance w/ suppressive minocycline. Recent fall ~2 wks prior, likely cause of DL trauma. LVAD coordinator replaced modular cable and controller with resolution of driveline communication fault signal. CT C/A/P c/w prior imaging w/ similar appearance of mild circumferential thickening of the fat plane surrounding the drive line in the upper abdomen extending from the pump to the skin which may be chronic or  represent an infectious process. No drainable fluid collection. Wound + BCx collected. Started on empiric daptomycin/Unasyn per ID. Initial wound Cx + for Pseudomonas Aeruginosa (pan-sensitive) and S. aureus. Cefepime added. Now on Dapto, Cefepime and minocycline. BCx NGTD. Taken to OR 7/31 for wound debridement + application of wound vac - continue abx per ID. Appreciate their assistance. ID recommends 4 weeks of daptomycin then Q 3 wks dalbavancin. Would have significant concern about him receiving IV abx at home with history of drug use and noncompliance. Would likely need to discharge to SNF if needs IV abx after discharge but he does not want to return to SNF. - Returned to OR 8/7 for wound wash out/debridement  2. Suspected DL fracture with multiple recent falls - Has had multiple falls during the last 2 months, one direct trauma to mid sternum (fell on cinder block) - CT chest/abd/pelvis per above  - CXR w/ LVAD partially imaged. The associated lead noted to be  intact w/ no fracture or kink  - LVAD coordinators changed modular cable and controller  3.  Chronic systolic CHF: Echo 10/21 with EF 20-25%, mildly decreased RV function. LHC/RHC in 12/21 with patent grafts, low output. Suspect mixed ischemic/nonischemic cardiomyopathy (prior heavy ETOH and drugs as well as CAD). Admitted with cardiogenic shock in 12/21, had placement of Impella 5.5 initially, now s/p Heartmate 3 LVAD on 11/17/20.  Ramp echo 2/23 with speed decreased to 5500 rpm. Speed now back to 5600 rpm.   - He is not volume overloaded on exam.      - Continue current Toprol XL 25 mg daily.  - Start losartan 12.5 mg daily - He does not need a diuretic.   - INR 1.1 on admission. Suspect noncompliant with Warfarin. - INR 1.4 today; continue heparin gtt   4. CKD stage 3: Prior CTA abdomen/pelvis with high-grade stenosis of proximal right renal artery.  - Would consider eventual reassessment of renal artery stenosis and possible  treatment  - SCr stable   5.  CAD: S/p CABG 12/20.  LHC pre-VAD with patent grafts, no target for intervention. - Continue Crestor 20 mg daily.   6.  Atrial fibrillation: Paroxysmal.  S/p Maze and LA appendage clip with CABG in 12/20. Maintaining SR.  - Continue Toprol XL 25 mg daily.  - Off amiodarone with hyperthyroidism.  7. Type 2 diabetes: improved control, Hgb A1c down from 12.1>>6.3  - continue SSI   8. 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 He says last use of methamphetamine was > 2 months ago. - UDS this admit negative (however collected > 48 hrs post admit)  - Declines substance use resources  9. 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  Followed by endocrinology. He is now off amiodarone. He had been on methimazole but has not been taking recently. TSH was high on admit and free T4 normal  - Will NOT restart methimazole   10. Anemia (chronic blood loss):  Hgb stable. No gross bleeding.  - follow CBP   11. COPD: Prior smoker.  - Continue Trelegy.   12. PVCs/NSVT:  - Now off amiodarone.   13. Depression: Has turned to substance abuse to cope.   - Has been referred to OP psych in [place]   14.  Gout: Continue allopurinol, he has prn colchicine.   15. HTN: MAPs improving. In setting of known Rt RAS. Hold off on ACE/ARB. - monitor closely, if remains elevated may need reassessment of renal artery stenosis and possible treatment. Significant calcifications in both renal arteries on CT abdomen this admit. - Continue Amlodipine 10 mg  - start losartan 12.5 mg daily  16. Agitation: - Klonopin increased, agitation improved  17. Incisional hernia: -Continue abdominal binder  ID recommending 4 weeks IV abx. Patient refuses to go back to [facility] for remaining 2 weeks.   I reviewed the LVAD parameters from today, and compared the results to the patient's prior recorded data.  No programming changes were made.  The LVAD is functioning  within specified parameters.  The patient performs LVAD self-test daily.  LVAD interrogation was negative for any significant power changes, alarms or PI events/speed drops.  LVAD equipment check completed and is in good working order.  Back-up equipment present.   LVAD education done on emergency procedures and precautions and reviewed exit site care.  Length of Stay: 11  Alen Bleacher, NP 07/18/2023, 7:26 AM  VAD Team --- VAD ISSUES ONLY--- Pager [phone] (7am -  7am)  Advanced Heart Failure Team  Pager [phone] (M-F; 7a - 5p)  Please contact [facility] for night-coverage after hours (5p -7a ) and weekends on [email or web address]  Patient seen and examined with the above-signed Advanced Practice Provider and/or Housestaff. I personally reviewed laboratory data, imaging studies and relevant notes. I independently examined the patient and formulated the important aspects of the plan. I have edited the note to reflect any of my changes or salient points. I have personally discussed the plan with the patient and/or family.  Underwent repeat I&D of DL wound yesterday in OR. Remains on IV abx.   No f/c. Mildly sore.   INR 1.4. on heparin gtt  General:  NAD.  HEENT: normal  Neck: supple. JVP not elevated.  Carotids 2+ bilat; no bruits. No lymphadenopathy or thryomegaly appreciated. Cor: LVAD hum.  Lungs: Clear. Abdomen: + ab binder soft, nontender, non-distended. No hepatosplenomegaly. No bruits or masses. Good bowel sounds. Driveline site with dressing Anchor in place.  Extremities: no cyanosis, clubbing, rash. Warm no edema  Neuro: alert & oriented x 3. No focal deficits. Moves all 4 without problem   S/p repeat I&D of DL site. Needs 2 more weeks IV abx. Refuses SNF. Says his family may be able to help him at home. Will d/w VAD team.   Continue IV abx and heparin currently. Discussed dosing with PharmD personally.  VAD interrogated personally. Parameters stable.  Arvilla Meres, MD  4:22  PM

## 2023-07-18 NOTE — Inpatient Diabetes Management (Signed)
Inpatient Diabetes Program Recommendations  AACE/ADA: New Consensus Statement on Inpatient Glycemic Control (2015)  Target Ranges:  Prepandial:   less than 140 mg/dL      Peak postprandial:   less than 180 mg/dL (1-2 hours)      Critically ill patients:  140 - 180 mg/dL   Lab Results  Component Value Date   GLUCAP 221 (H) 07/18/2023   HGBA1C 6.3 (H) 07/09/2023    Review of Glycemic Control  Diabetes history: DM2 Outpatient Diabetes medications: Glucotrol 5 mg QD Current orders for Inpatient glycemic control: Novolog 0-15 units TID and 0-5 units QHS  Inpatient Diabetes Program Recommendations:    Might consider Novolog 3 units TID with meals if he consumes at least 50%.   Will continue to follow while inpatient.  Thank you, Dulce Sellar, MSN, CDCES Diabetes Coordinator Inpatient Diabetes Program [phone] (team pager from 8a-5p)

## 2023-07-18 NOTE — Progress Notes (Signed)
1 Day Post-Op Procedure(s) (LRB): DRIVELINE WOUND DEBRIDEMENT (N/A) Subjective: Patient feels well after surgical debridement of the VAD tunnel yesterday.  Surgical dressing dry and intact.  The wound infection is now locally controlled and no further surgical debridement is planned.  The best strategy moving forward is daily Vashe wet-to-dry dressing changes supplemented with antibiotics-IV while in the hospital and probable transition to oral antibiotic at discharge since he is refusing skilled nursing facility care. He needs to wear the abdominal binder as well at all times. Objective: Vital signs in last 24 hours: Temp:  [97.7 F (36.5 C)-98.6 F (37 C)] 98 F (36.7 C) (08/09 0820) Pulse Rate:  [76-92] 86 (08/09 0820) Cardiac Rhythm: Normal sinus rhythm (08/08 2000) Resp:  [11-22] 18 (08/09 0820) BP: (98-129)/(78-99) 129/93 (08/09 0820) SpO2:  [95 %-100 %] 97 % (08/09 0839) Weight:  [91.2 kg-92 kg] 91.2 kg (08/09 0500)  Hemodynamic parameters for last 24 hours:  Sinus rhythm  Intake/Output from previous day: 08/08 0701 - 08/09 0700 In: 709.4 [P.O.:360; I.V.:83.5; IV Piggyback:265.9] Out: 400 [Urine:400] Intake/Output this shift: Total I/O In: 118 [P.O.:118] Out: 750 [Urine:750]  EXAM Alert and comfortable Sinus rhythm Normal VAD hum Lungs clear Abdomen nontender with chronic large diastases rectus.  Surgical dressing intact  Lab Results: Recent Labs    07/17/23 0616 07/18/23 0621  WBC 8.5 8.0  HGB 11.2* 11.2*  HCT 34.0* 34.6*  PLT 192 189   BMET:  Recent Labs    07/17/23 0616 07/18/23 0621  NA 137 135  K 4.1 4.6  CL 102 98  CO2 26 26  GLUCOSE 215* 232*  BUN 25* 28*  CREATININE 1.07 1.06  CALCIUM 9.1 9.3    PT/INR:  Recent Labs    07/18/23 0621  LABPROT 17.5*  INR 1.4*   ABG    Component Value Date/Time   PHART 7.508 (H) 11/20/2020 0438   HCO3 24.8 09/09/2021 0313   TCO2 29 11/20/2020 0438   ACIDBASEDEF 6.0 (H) 11/24/2019 1718   O2SAT 63.4  09/09/2021 0313   CBG (last 3)  Recent Labs    07/17/23 1616 07/17/23 2109 07/18/23 0604  GLUCAP 126* 213* 221*    Assessment/Plan: S/P Procedure(s) (LRB): DRIVELINE WOUND DEBRIDEMENT (N/A) Plan to keep patient in the hospital through the weekend for daily Vashe wet-to-dry dressing changes and IV antibiotics.  Disposition following hospitalization is not clear as the patient is refusing SNF placement for further IV antibiotic therapy.   LOS: 11 days    Lovett Sox 07/18/2023

## 2023-07-18 NOTE — Progress Notes (Signed)
ANTICOAGULATION CONSULT NOTE  Pharmacy Consult for warfarin + fixed-dose heparin Indication:  LVAD  Allergies  Allergen Reactions   Vancomycin Nausea And Vomiting and Other (See Comments)    Severe joint pain    Patient Measurements: Height: 5\' 10"  (177.8 cm) Weight: 91.2 kg (201 lb 1 oz) IBW/kg (Calculated) : 73 Heparin Dosing Weight: 91.4 kg   Vital Signs: Temp: 98 F (36.7 C) (08/09 0820) Temp Source: Oral (08/09 0820) BP: 129/93 (08/09 0820) Pulse Rate: 86 (08/09 0820)  Labs: Recent Labs    07/16/23 0528 07/17/23 0616 07/18/23 0621  HGB 11.1* 11.2* 11.2*  HCT 34.1* 34.0* 34.6*  PLT 199 192 189  LABPROT 18.0* 17.4* 17.5*  INR 1.5* 1.4* 1.4*  HEPARINUNFRC <0.10* <0.10* <0.10*  CREATININE 1.05 1.07 1.06    Estimated Creatinine Clearance: 82.1 mL/min (by C-G formula based on SCr of 1.06 mg/dL).   Medical History: Past Medical History:  Diagnosis Date   "    Arthritis    CAD (coronary artery disease)    a. s/p CABG in 11/2019 with LIMA-LAD, SVG-OM1, SVG-PDA and SVG-D1   CHF (congestive heart failure) (HCC)    a. EF < 20% by echo in 11/2019   Essential hypertension    PAF (paroxysmal atrial fibrillation) (HCC)    Type 2 diabetes mellitus (HCC)     Medications:  Scheduled:   allopurinol  100 mg Oral Daily   amLODipine  10 mg Oral Daily   clonazepam  0.5 mg Oral BID   fluticasone furoate-vilanterol  1 puff Inhalation Daily   And   umeclidinium bromide  1 puff Inhalation Daily   gabapentin  300 mg Oral BID   insulin aspart  0-15 Units Subcutaneous TID WC   insulin aspart  0-5 Units Subcutaneous QHS   losartan  12.5 mg Oral Daily   metoprolol succinate  25 mg Oral Daily   minocycline  200 mg Oral BID   mirtazapine  15 mg Oral QHS   pantoprazole  40 mg Oral Daily   sertraline  50 mg Oral Daily   tamsulosin  0.4 mg Oral Daily   thiamine  100 mg Oral Daily   traZODone  150 mg Oral QHS   warfarin  7.5 mg Oral ONCE-1600   Warfarin - Pharmacist Dosing  Inpatient   Does not apply q1600   zinc sulfate  220 mg Oral Daily    Assessment: 62 yom who presented to clinic with concern for driveline fracture and infection. Last [facility] was on 04/21/2023 - INR was subtherapeutic at 1.4 and regimen at that time was 6 mg daily except 4 mg Tues/Thurs (LD 7/25).  Given low INR on admit, started fixed rate heparin 500 units/hr until INR >1.8. Heparin level <0.1, CBC and LDH stable, INR remains low at 1.4 - will boost now that no more debridements planned.   Goal of Therapy:  Heparin level <0.3 units/ml INR 2-2.5  - keeping lower INR goal ~1.5 given plan for debridement upcoming week Monitor platelets by anticoagulation protocol: Yes   Plan:  Warfarin 7.5mg  x1 tonight   Continue heparin infusion at 500 units/hr >> (no up titrations unless discussed with MD) Monitor daily heparin level, INR, CBC, LDH, and for s/sx of bleeding     Leota Sauers Pharm.D. CPP, BCPS Clinical Pharmacist [phone] 07/18/2023 10:27 AM   Please check AMION for all [facility] Pharmacy numbers 07/18/2023

## 2023-07-18 NOTE — Progress Notes (Signed)
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 Started on IV antibiotics.   Patient presented to VAD clinic 7/29 for driveline communication fault- stating that he has had "alarms every couple hrs from his VAD for a couple months." Pt has no showed and cancelled all appointments in the past 2 months. Pt also had green/blue drainage visible through his driveline bandage. (Dressing change and wound cx obtained). Pt states that he has experienced dizziness associated with a fall multiple times over the past couple months but no signs of bleeding. Controller and modular cable exchanged. No further communication fault alarms noted.   CT Chest/Abd/Pelvis 7/29 1. Similar appearance of mild circumferential thickening of the fat plane surrounding the drive line in the upper abdomen extending from the pump to the skin which may be chronic or represent an infectious process. No drainable fluid collection.  Pt had drive line debridement in OR with placement of instillation wound vac on 07/09/23 with Dr Donata Clay. This was change to wet/dry on Friday 8/2 due to ongoing alarms related to the pts hernia. Plan to return to OR for washout today.   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 Currently on IV Cefepime Q 8 hrs and IV Daptomycin 800 mg daily per ID team. ID team is recommending 4 weeks IV antibiotics starting today. Pt is refusing to go a facility for IV antibiotics.  Pt sitting on side of the bed. States he walked earlier this morning. Denies complaints. He is very thankful for the care he is receiving. He is wearing his abdominal binder.  His wife Gloris Manchester is here today.   Vital signs: Temp: 97.6 HR: 93 Doppler Pressure: 88 Automatic BP: 122/92(102) O2 Sat: 97% on RA Wt: 193.3>194.8>196.4>194.7>197.9>198.6>198.2>200.8>201 lbs  LVAD interrogation reveals:  Speed: 5600 Flow:  4.0 Power: 4.2 w PI: 3.6 Alarms: none Events: 6 PI events today; 50+ yesterday Hct: 34  Fixed speed:  5600 Low speed limit:  5300  Drive Line: Existing VAD dressing removed and site care performed using sterile technique. Wound bed cleansed with VASHE solution and sterile 4x4 gauze through light manual debridement. Skin surrounding wound bed cleaned with Chlora prep applicators x 2, allowed to dry. VASHE moistened Kerlix placed in wound bed, covered with several dry 4 x 4s.  Drive line unincorporated. Moderate amount of serosanguinous/yellow drainage noted. No redness, tenderness, foul odor or rash noted. Drive line anchor secured.   Continue daily wet to dry dressing changes using VASHE solution per bedside RN or VAD coordinator. Next dressing change due 07/19/23 by bedside nurse.         Labs:  LDH trend: 121>133>130>138>172>175>165>158>169  INR trend: 1.0>1.1>1.2>1.4>1.3>1.4>1.5>1.4  Anticoagulation Plan: -INR Goal: 2.0 - 2.5 -ASA Dose: none  ICD: N/A  Infection:  07/07/23>> blood cultures>> no growth 5 days; final  07/07/23>> wound cx>> few pseudomonas aeruginosa and MRSA; final  07/09/23>> OR driveline cx>> Few pseudomonas aeruginosa and METHICILLIN RESISTANT STAPHYLOCOCCUS AUREUS 07/09/23>> blood cultures>>no growth x 5 days; final  Drips:  Heparin 500 units/hr  Plan/Recommendations:  Page VAD coordinator with equipment issues or driveline problems Daily drive line wet to dry dressing changes with VASHE solution.   Carlton Adam RN,BSN VAD Coordinator  Office: [phone]  24/7 Pager: [phone]

## 2023-07-18 NOTE — Plan of Care (Signed)
  Problem: Education: Goal: Patient will understand all VAD equipment and how it functions Outcome: Progressing Goal: Patient will be able to verbalize current INR target range and antiplatelet therapy for discharge home Outcome: Progressing   Problem: Education: Goal: Knowledge of General Education information will improve Description: Including pain rating scale, medication(s)/side effects and non-pharmacologic comfort measures Outcome: Progressing   Problem: Health Behavior/Discharge Planning: Goal: Ability to manage health-related needs will improve Outcome: Progressing   Problem: Clinical Measurements: Goal: Ability to maintain clinical measurements within normal limits will improve Outcome: Progressing   Problem: Nutrition: Goal: Adequate nutrition will be maintained Outcome: Progressing   Problem: Coping: Goal: Level of anxiety will decrease Outcome: Progressing   Problem: Pain Managment: Goal: General experience of comfort will improve Outcome: Progressing   Problem: Safety: Goal: Ability to remain free from injury will improve Outcome: Progressing   Problem: Skin Integrity: Goal: Risk for impaired skin integrity will decrease Outcome: Progressing

## 2023-07-19 DIAGNOSIS — T827XXA Infection and inflammatory reaction due to other cardiac and vascular devices, implants and grafts, initial encounter: Secondary | ICD-10-CM | POA: Diagnosis not present

## 2023-07-19 LAB — BASIC METABOLIC PANEL WITH GFR
Anion gap: 11 (ref 5–15)
BUN: 24 mg/dL — ABNORMAL HIGH (ref 8–23)
CO2: 24 mmol/L (ref 22–32)
Calcium: 8.7 mg/dL — ABNORMAL LOW (ref 8.9–10.3)
Chloride: 99 mmol/L (ref 98–111)
Creatinine, Ser: 1.1 mg/dL (ref 0.61–1.24)
GFR, Estimated: >60 mL/min (ref >60)
Glucose, Bld: 240 mg/dL — ABNORMAL HIGH (ref 70–99)
Potassium: 4.4 mmol/L (ref 3.5–5.1)
Sodium: 134 mmol/L — ABNORMAL LOW (ref 135–145)

## 2023-07-19 LAB — CBC
HCT: 33.3 % — ABNORMAL LOW (ref 39.0–52.0)
Hemoglobin: 10.6 g/dL — ABNORMAL LOW (ref 13.0–17.0)
MCH: 27.5 pg (ref 26.0–34.0)
MCHC: 31.8 g/dL (ref 30.0–36.0)
MCV: 86.3 fL (ref 80.0–100.0)
Platelets: 171 K/uL (ref 150–400)
RBC: 3.86 MIL/uL — ABNORMAL LOW (ref 4.22–5.81)
RDW: 14.8 % (ref 11.5–15.5)
WBC: 7.3 K/uL (ref 4.0–10.5)
nRBC: 0 % (ref 0.0–0.2)

## 2023-07-19 LAB — GLUCOSE, CAPILLARY
Glucose-Capillary: 211 mg/dL — ABNORMAL HIGH (ref 70–99)
Glucose-Capillary: 240 mg/dL — ABNORMAL HIGH (ref 70–99)
Glucose-Capillary: 204 mg/dL — ABNORMAL HIGH (ref 70–99)
Glucose-Capillary: 193 mg/dL — ABNORMAL HIGH (ref 70–99)

## 2023-07-19 LAB — PROTIME-INR
INR: 1.7 — ABNORMAL HIGH (ref 0.8–1.2)
Prothrombin Time: 19.8 s — ABNORMAL HIGH (ref 11.4–15.2)

## 2023-07-19 MED ORDER — WARFARIN SODIUM 3 MG PO TABS
6.0000 mg | ORAL_TABLET | Freq: Every day | ORAL | Status: DC
  Administered 2023-07-19 – 2023-07-21 (×3): 6 mg via ORAL
  Filled 2023-07-19 (×3): qty 2

## 2023-07-19 NOTE — Progress Notes (Addendum)
ANTICOAGULATION CONSULT NOTE  Pharmacy Consult for warfarin + fixed-dose heparin Indication:  LVAD  Allergies  Allergen Reactions   Vancomycin Nausea And Vomiting and Other (See Comments)    Severe joint pain    Patient Measurements: Height: 5\' 10"  (177.8 cm) Weight: 93.3 kg (205 lb 11 oz) IBW/kg (Calculated) : 73 Heparin Dosing Weight: 91.4 kg   Vital Signs: Temp: 97.7 F (36.5 C) (08/10 0718) Temp Source: Oral (08/10 0718) BP: 128/91 (08/10 0718) Pulse Rate: 80 (08/10 0730)  Labs: Recent Labs    07/17/23 0616 07/18/23 0621 07/19/23 0630  HGB 11.2* 11.2* 10.6*  HCT 34.0* 34.6* 33.3*  PLT 192 189 171  LABPROT 17.4* 17.5* 19.8*  INR 1.4* 1.4* 1.7*  HEPARINUNFRC <0.10* <0.10* <0.10*  CREATININE 1.07 1.06 1.10    Estimated Creatinine Clearance: 79.9 mL/min (by C-G formula based on SCr of 1.1 mg/dL).   Medical History: Past Medical History:  Diagnosis Date   "    Arthritis    CAD (coronary artery disease)    a. s/p CABG in 11/2019 with LIMA-LAD, SVG-OM1, SVG-PDA and SVG-D1   CHF (congestive heart failure) (HCC)    a. EF < 20% by echo in 11/2019   Essential hypertension    PAF (paroxysmal atrial fibrillation) (HCC)    Type 2 diabetes mellitus (HCC)     Medications:  Scheduled:   allopurinol  100 mg Oral Daily   amLODipine  10 mg Oral Daily   clonazepam  0.5 mg Oral BID   fluticasone furoate-vilanterol  1 puff Inhalation Daily   And   umeclidinium bromide  1 puff Inhalation Daily   gabapentin  300 mg Oral BID   insulin aspart  0-15 Units Subcutaneous TID WC   insulin aspart  0-5 Units Subcutaneous QHS   losartan  12.5 mg Oral Daily   metoprolol succinate  25 mg Oral Daily   minocycline  200 mg Oral BID   mirtazapine  15 mg Oral QHS   pantoprazole  40 mg Oral Daily   sertraline  50 mg Oral Daily   tamsulosin  0.4 mg Oral Daily   thiamine  100 mg Oral Daily   traZODone  150 mg Oral QHS   warfarin  6 mg Oral q1600   Warfarin - Pharmacist Dosing  Inpatient   Does not apply q1600   zinc sulfate  220 mg Oral Daily    Assessment: 62 yom who presented to clinic with concern for driveline fracture and infection. Last [facility] appointment was on 04/21/2023 - INR was subtherapeutic at 1.4 and regimen at that time was 6 mg daily except 4 mg Tues/Thurs (LD 7/25).  Given low INR on admit, started fixed rate heparin 500 units/hr  Heparin level <0.1, CBC and LDH stable, INR bump to 1.7 after boost now that no more debridements planned.   Goal of Therapy:  Heparin level <0.3 units/ml INR 2-2.5  - keeping lower INR goal ~1.5 given plan for debridement upcoming week Monitor platelets by anticoagulation protocol: Yes   Plan:  Warfarin 6mg  daily  Stop heparin  Monitor daily heparin level, INR, CBC, LDH, and for s/sx of bleeding     Leota Sauers Pharm.D. CPP, BCPS Clinical Pharmacist [phone] 07/19/2023 7:44 AM   Please check AMION for all [facility] Pharmacy numbers 07/19/2023

## 2023-07-19 NOTE — Progress Notes (Signed)
Advanced Heart Failure VAD Team Note  PCP-Cardiologist: Nona Dell, MD  [facility]: Dr. Shirlee Latch   Subjective:    7/29: admitted w/ suspected DL fracture and DL infection. P/w "driveline communication fault" error message on LVAD. No pump stoppages. LVAD coordinator replaced modular cable and controller with resolution of driveline communication fault signal. CT C/A/P c/w prior imaging w/ similar appearance of mild circumferential thickening of the fat plane surrounding the drive line in the upper abdomen extending from the pump to the skin which may be chronic or represent an infectious process. No drainable fluid collection. Wound + BCx collected. Started on empiric daptomycin/Unasyn per ID.  7/30: Wound Cx growing Pseudomonas Aeruginosa (pan-sensitive) and S. aureus. Cefepime added 7/31: OR for wound debridement + application of wound vac.   8/8: Returned to OR for I&D  Remains on Dapto, Cefepime and minocycline. Surgical wound with pseudomonas aeruginosa and MRSA. Bcx from 7/29 and 7/31 NG.  INR 1.7 Remains on heparin gtt. No bleeding  Feels ok. No fevers or chills.   LVAD INTERROGATION:  HeartMate III LVAD:   Flow 4.2 liters/min, speed 5600, power 4.0, PI 3.0   Objective:    Vital Signs:   Temp:  [97.6 F (36.4 C)-98 F (36.7 C)] 97.7 F (36.5 C) (08/10 0718) Pulse Rate:  [75-93] 80 (08/10 0750) Resp:  [12-20] 20 (08/10 0730) BP: (106-131)/(78-93) 128/91 (08/10 0718) SpO2:  [93 %-98 %] 94 % (08/10 0750) Weight:  [93.3 kg] 93.3 kg (08/10 0554) Last BM Date : 07/17/23 Mean arterial Pressure 90  Intake/Output:   Intake/Output Summary (Last 24 hours) at 07/19/2023 0947 Last data filed at 07/19/2023 0800 Gross per 24 hour  Intake 664.84 ml  Output 1750 ml  Net -1085.16 ml     Physical Exam   General:  NAD.  HEENT: normal  Neck: supple. JVP not elevated.  Carotids 2+ bilat; no bruits. No lymphadenopathy or thryomegaly appreciated. Cor: LVAD hum.  Lungs:  Clear. Abdomen: ab binder in place soft, nontender, non-distended. No hepatosplenomegaly. No bruits or masses. Good bowel sounds. Large incisional hernia. Wound dressing c/d/i Extremities: no cyanosis, clubbing, rash. Warm no edema  Neuro: alert & oriented x 3. No focal deficits. Moves all 4 without problem   Telemetry   NSR 80s + PVcs Personally reviewed  Labs   Basic Metabolic Panel: Recent Labs  Lab 07/15/23 0527 07/16/23 0528 07/17/23 0616 07/18/23 0621 07/19/23 0630  NA 135 135 137 135 134*  K 3.9 4.2 4.1 4.6 4.4  CL 101 101 102 98 99  CO2 22 23 26 26 24   GLUCOSE 190* 189* 215* 232* 240*  BUN 22 26* 25* 28* 24*  CREATININE 1.00 1.05 1.07 1.06 1.10  CALCIUM 9.1 9.1 9.1 9.3 8.7*  MG  --   --   --   --  1.8    Liver Function Tests: No results for input(s): "AST", "ALT", "ALKPHOS", "BILITOT", "PROT", "ALBUMIN" in the last 168 hours.  CBC: Recent Labs  Lab 07/15/23 0527 07/16/23 0528 07/17/23 0616 07/18/23 0621 07/19/23 0630  WBC 10.2 9.1 8.5 8.0 7.3  HGB 11.6* 11.1* 11.2* 11.2* 10.6*  HCT 35.6* 34.1* 34.0* 34.6* 33.3*  MCV 84.0 84.8 85.4 85.6 86.3  PLT 230 199 192 189 171    INR: Recent Labs  Lab 07/15/23 0527 07/16/23 0528 07/17/23 0616 07/18/23 0621 07/19/23 0630  INR 1.4* 1.5* 1.4* 1.4* 1.7*    Other results: EKG:    Imaging   No results found.   Medications:  Scheduled Medications:  allopurinol  100 mg Oral Daily   amLODipine  10 mg Oral Daily   clonazepam  0.5 mg Oral BID   fluticasone furoate-vilanterol  1 puff Inhalation Daily   And   umeclidinium bromide  1 puff Inhalation Daily   gabapentin  300 mg Oral BID   insulin aspart  0-15 Units Subcutaneous TID WC   insulin aspart  0-5 Units Subcutaneous QHS   losartan  12.5 mg Oral Daily   metoprolol succinate  25 mg Oral Daily   minocycline  200 mg Oral BID   mirtazapine  15 mg Oral QHS   pantoprazole  40 mg Oral Daily   sertraline  50 mg Oral Daily   tamsulosin  0.4 mg Oral  Daily   thiamine  100 mg Oral Daily   traZODone  150 mg Oral QHS   warfarin  6 mg Oral q1600   Warfarin - Pharmacist Dosing Inpatient   Does not apply q1600   zinc sulfate  220 mg Oral Daily    Infusions:  ceFEPime (MAXIPIME) IV 2 g (07/19/23 0603)   DAPTOmycin (CUBICIN) 800 mg in sodium chloride 0.9 % IVPB 800 mg (07/18/23 1603)    PRN Medications: acetaminophen, fluticasone, HYDROcodone-acetaminophen   Patient Profile  62 y.o. 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 Now s/p Heartmate 3 LVAD. Now admitted w/ suspected DL fracture after multiple falls and recurrent DL infection.  Assessment/Plan:    1.  LVAD Driveline infection: MRSA driveline infection, admitted in 6/23 and again in 7/23-8/23 with multiple trips to the OR for debridement.  Cultures have grown acinetobacter and MRSA. He was readmitted with driveline infection in 2/24, completed course of daptomycin for MRSA. Now readmitted w/ suspected DL fracture and DL infection. P/w "driveline communication fault" error message on LVAD.LVAD coordinator replaced modular cable and controller with resolution of driveline communication fault signal. CT C/A/P c/w prior imaging w/ similar appearance of mild circumferential thickening of the fat plane surrounding the drive line in the upper abdomen extending from the pump to the skin which may be chronic or represent an infectious process. No drainable fluid collection.  - Started on empiric daptomycin/Unasyn per ID. Initial wound Cx + for Pseudomonas Aeruginosa (pan-sensitive) and S. aureus. Cefepime added. Now on Dapto, Cefepime and minocycline. BCx NGTD. Taken to OR 7/31 for wound debridement + application of wound vac. Returned to OR 8/7 for wound wash out/debridement - continue abx per ID.  Appreciate their assistance. ID recommends 4 weeks of daptomycin then Q 3 wks dalbavancin. Would have significant concern about him receiving IV abx at home with history of drug use and noncompliance. Would likely need to discharge to SNF if needs IV abx after discharge but he does not want to return to SNF. - site stable. Continue IV abx.  - will work with VAD team to see if we can do home IV abx with support from his sisters  2. Suspected DL fracture with multiple recent falls - Has had multiple falls during the last 2 months, one direct trauma to mid sternum (fell on cinder block) - CT chest/abd/pelvis per above  - CXR w/ LVAD partially imaged. The associated lead noted to be intact w/ no fracture or kink  - LVAD coordinators changed modular cable and controller with resolution  3.  Chronic systolic CHF s/p LVAD: Echo 10/21 with EF 20-25%, mildly decreased RV function. LHC/RHC in 12/21 with patent grafts, low output. Suspect mixed ischemic/nonischemic cardiomyopathy (prior heavy ETOH and drugs as well as CAD). Admitted with cardiogenic shock in 12/21, had placement of Impella 5.5 initially, now s/p Heartmate 3 LVAD on 11/17/20.  Ramp echo 2/23 with speed decreased to 5500 rpm. Speed now back to 5600 rpm.   - He is not volume overloaded on exam.      - Continue current Toprol XL 25 mg daily.  - Continue losartan 12.5 mg daily  - He does not need a diuretic.   - INR 1.1 on admission. Suspect noncompliant with Warfarin. - INR 1.7 today. Stop heparin. Discussed dosing with PharmD personally. - VAD interrogated personally. Parameters stable.  4. CKD stage 3: Prior CTA abdomen/pelvis with high-grade stenosis of proximal right renal artery.  - Would consider eventual reassessment of renal artery stenosis and possible treatment  - SCr stable   5.  CAD: S/p CABG 12/20.  LHC pre-VAD with patent grafts, no target for intervention. - Continue Crestor 20 mg daily.  - No s/s angina  6.  Atrial  fibrillation: Paroxysmal.  S/p Maze and LA appendage clip with CABG in 12/20. Maintaining SR.  - Continue Toprol XL 25 mg daily.  - Off amiodarone with hyperthyroidism.  7. Type 2 diabetes: improved control, Hgb A1c down from 12.1>>6.3  - continue SSI   8. 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 He says last use of methamphetamine was > 2 months ago. - UDS this admit negative (however collected > 48 hrs post admit)  - Declines substance use resources  9. 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  Followed by endocrinology. He is now off amiodarone. He had been on methimazole but has not been taking recently. TSH was high on admit and free T4 normal  - Will NOT restart methimazole   10. Anemia (chronic blood loss):  Hgb stable. No gross bleeding.  - follow CBP   11. COPD: Prior smoker.  - Continue Trelegy.   12. PVCs/NSVT:  - Now off amiodarone.   13. Depression: Has turned to substance abuse to cope.   - Has been referred to OP psych in    14.  Gout: Continue allopurinol, he has prn colchicine.   15. HTN: MAPs improving. In setting of known Rt RAS. Hold off on ACE/ARB. - monitor closely, if remains elevated may need reassessment of renal artery stenosis and possible treatment. Significant calcifications in both renal arteries on CT abdomen this admit. - Continue Amlodipine 10 mg  - MAPs improved with addition of losartan 12.5 mg daily  16. Agitation: - Klonopin increased, agitation improved  17. Incisional hernia: -Continue abdominal binder   I reviewed the LVAD parameters from today, and compared the results to the patient's prior recorded data.  No programming changes were made.  The LVAD is functioning within specified parameters.  The patient performs LVAD self-test daily.  LVAD interrogation was negative for any significant power changes, alarms or PI events/speed drops.  LVAD equipment check completed and is in good working order.   Back-up equipment present.   LVAD education done on emergency procedures and precautions and reviewed exit site care.  Length of Stay: 12  Arvilla Meres, MD 07/19/2023, 9:47 AM  VAD Team --- VAD ISSUES ONLY--- Pager [phone] (7am - 7am)  Advanced Heart Failure Team  Pager [phone] (M-F; 7a - 5p)  Please  contact [facility] for night-coverage after hours (5p -7a ) and weekends on [email or web address]

## 2023-07-20 DIAGNOSIS — T827XXA Infection and inflammatory reaction due to other cardiac and vascular devices, implants and grafts, initial encounter: Secondary | ICD-10-CM | POA: Diagnosis not present

## 2023-07-20 LAB — BASIC METABOLIC PANEL WITH GFR
Anion gap: 8 (ref 5–15)
BUN: 28 mg/dL — ABNORMAL HIGH (ref 8–23)
CO2: 23 mmol/L (ref 22–32)
Calcium: 8.5 mg/dL — ABNORMAL LOW (ref 8.9–10.3)
Chloride: 101 mmol/L (ref 98–111)
Creatinine, Ser: 1.09 mg/dL (ref 0.61–1.24)
GFR, Estimated: >60 mL/min (ref >60)
Glucose, Bld: 271 mg/dL — ABNORMAL HIGH (ref 70–99)
Potassium: 4.4 mmol/L (ref 3.5–5.1)
Sodium: 132 mmol/L — ABNORMAL LOW (ref 135–145)

## 2023-07-20 LAB — LACTATE DEHYDROGENASE: LDH: 159 U/L (ref 98–192)

## 2023-07-20 LAB — GLUCOSE, CAPILLARY
Glucose-Capillary: 247 mg/dL — ABNORMAL HIGH (ref 70–99)
Glucose-Capillary: 184 mg/dL — ABNORMAL HIGH (ref 70–99)
Glucose-Capillary: 260 mg/dL — ABNORMAL HIGH (ref 70–99)
Glucose-Capillary: 145 mg/dL — ABNORMAL HIGH (ref 70–99)

## 2023-07-20 LAB — CBC
HCT: 31.5 % — ABNORMAL LOW (ref 39.0–52.0)
Hemoglobin: 10.2 g/dL — ABNORMAL LOW (ref 13.0–17.0)
MCH: 28.6 pg (ref 26.0–34.0)
MCHC: 32.4 g/dL (ref 30.0–36.0)
MCV: 88.2 fL (ref 80.0–100.0)
Platelets: 162 10*3/uL (ref 150–400)
RBC: 3.57 MIL/uL — ABNORMAL LOW (ref 4.22–5.81)
RDW: 14.8 % (ref 11.5–15.5)
WBC: 7.1 10*3/uL (ref 4.0–10.5)
nRBC: 0.3 % — ABNORMAL HIGH (ref 0.0–0.2)

## 2023-07-20 LAB — PROTIME-INR
INR: 1.8 — ABNORMAL HIGH (ref 0.8–1.2)
Prothrombin Time: 21.3 s — ABNORMAL HIGH (ref 11.4–15.2)

## 2023-07-20 LAB — MAGNESIUM: Magnesium: 1.9 mg/dL (ref 1.7–2.4)

## 2023-07-20 MED ORDER — LOSARTAN POTASSIUM 25 MG PO TABS
25.0000 mg | ORAL_TABLET | Freq: Every day | ORAL | Status: DC
  Administered 2023-07-21 – 2023-07-28 (×8): 25 mg via ORAL
  Filled 2023-07-20 (×8): qty 1

## 2023-07-20 NOTE — Progress Notes (Signed)
Advanced Heart Failure VAD Team Note  PCP-Cardiologist: Nona Dell, MD  [facility]: Dr. Shirlee Latch   Subjective:    7/29: admitted w/ suspected DL fracture and DL infection. P/w "driveline communication fault" error message on LVAD. No pump stoppages. LVAD coordinator replaced modular cable and controller with resolution of driveline communication fault signal. CT C/A/P c/w prior imaging w/ similar appearance of mild circumferential thickening of the fat plane surrounding the drive line in the upper abdomen extending from the pump to the skin which may be chronic or represent an infectious process. No drainable fluid collection. Wound + BCx collected. Started on empiric daptomycin/Unasyn per ID.  7/30: Wound Cx growing Pseudomonas Aeruginosa (pan-sensitive) and S. aureus. Cefepime added 7/31: OR for wound debridement + application of wound vac.   8/8: Returned to OR for I&D  Remains on Dapto, Cefepime and minocycline. Surgical wound with pseudomonas aeruginosa and MRSA. Bcx from 7/29 and 7/31 NG.  INR 1.8 Heparin stopped yesterday. No bleeding.  Feels ok. No fevers or chills. Tolerating IV abx. MAPs elevated  LVAD INTERROGATION:  HeartMate III LVAD:   Flow 4.4 liters/min, speed 5600, power 4.0, PI 2.6  Objective:    Vital Signs:   Temp:  [97.4 F (36.3 C)-98.6 F (37 C)] 97.7 F (36.5 C) (08/11 0718) Pulse Rate:  [82-104] 86 (08/11 0733) Resp:  [13-18] 13 (08/10 2250) BP: (93-120)/(79-101) 114/91 (08/11 0718) SpO2:  [94 %-100 %] 99 % (08/11 0733) Weight:  [93.8 kg] 93.8 kg (08/11 0627) Last BM Date : 07/17/23 Mean arterial Pressure 95-105  Intake/Output:   Intake/Output Summary (Last 24 hours) at 07/20/2023 1006 Last data filed at 07/19/2023 2250 Gross per 24 hour  Intake 812 ml  Output 1100 ml  Net -288 ml     Physical Exam   General:  NAD.  HEENT: normal  Neck: supple. JVP not elevated.  Carotids 2+ bilat; no bruits. No lymphadenopathy or thryomegaly  appreciated. Cor: LVAD hum.  Lungs: Clear. Abdomen: obese soft, nontender, non-distended. No hepatosplenomegaly. No bruits or masses. Good bowel sounds. Incisional hernia. Dressing ok Extremities: no cyanosis, clubbing, rash. Warm no edema  Neuro: alert & oriented x 3. No focal deficits. Moves all 4 without problem    Telemetry   NSR 80s Personally reviewed  Labs   Basic Metabolic Panel: Recent Labs  Lab 07/16/23 0528 07/17/23 0616 07/18/23 0621 07/19/23 0630 07/20/23 0625  NA 135 137 135 134* 132*  K 4.2 4.1 4.6 4.4 4.4  CL 101 102 98 99 101  CO2 23 26 26 24 23   GLUCOSE 189* 215* 232* 240* 271*  BUN 26* 25* 28* 24* 28*  CREATININE 1.05 1.07 1.06 1.10 1.09  CALCIUM 9.1 9.1 9.3 8.7* 8.5*  MG  --   --   --  1.8 1.9    Liver Function Tests: No results for input(s): "AST", "ALT", "ALKPHOS", "BILITOT", "PROT", "ALBUMIN" in the last 168 hours.  CBC: Recent Labs  Lab 07/16/23 0528 07/17/23 0616 07/18/23 0621 07/19/23 0630 07/20/23 0625  WBC 9.1 8.5 8.0 7.3 7.1  HGB 11.1* 11.2* 11.2* 10.6* 10.2*  HCT 34.1* 34.0* 34.6* 33.3* 31.5*  MCV 84.8 85.4 85.6 86.3 88.2  PLT 199 192 189 171 162    INR: Recent Labs  Lab 07/16/23 0528 07/17/23 0616 07/18/23 0621 07/19/23 0630 07/20/23 0625  INR 1.5* 1.4* 1.4* 1.7* 1.8*    Other results:    Imaging   No results found.   Medications:     Scheduled Medications:  allopurinol  100 mg Oral Daily   amLODipine  10 mg Oral Daily   clonazepam  0.5 mg Oral BID   fluticasone furoate-vilanterol  1 puff Inhalation Daily   And   umeclidinium bromide  1 puff Inhalation Daily   gabapentin  300 mg Oral BID   insulin aspart  0-15 Units Subcutaneous TID WC   insulin aspart  0-5 Units Subcutaneous QHS   losartan  12.5 mg Oral Daily   metoprolol succinate  25 mg Oral Daily   minocycline  200 mg Oral BID   mirtazapine  15 mg Oral QHS   pantoprazole  40 mg Oral Daily   sertraline  50 mg Oral Daily   tamsulosin  0.4 mg  Oral Daily   thiamine  100 mg Oral Daily   traZODone  150 mg Oral QHS   warfarin  6 mg Oral q1600   Warfarin - Pharmacist Dosing Inpatient   Does not apply q1600   zinc sulfate  220 mg Oral Daily    Infusions:  ceFEPime (MAXIPIME) IV 2 g (07/20/23 6948)   DAPTOmycin (CUBICIN) 800 mg in sodium chloride 0.9 % IVPB 800 mg (07/19/23 1510)    PRN Medications: acetaminophen, fluticasone, HYDROcodone-acetaminophen   Patient Profile  62 y.o. 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 Now s/p Heartmate 3 LVAD. Now admitted w/ suspected DL fracture after multiple falls and recurrent DL infection.  Assessment/Plan:    1.  LVAD Driveline infection: MRSA driveline infection, admitted in 6/23 and again in 7/23-8/23 with multiple trips to the OR for debridement.  Cultures have grown acinetobacter and MRSA. He was readmitted with driveline infection in 2/24, completed course of daptomycin for MRSA. Now readmitted w/ suspected DL fracture and DL infection. P/w "driveline communication fault" error message on LVAD.LVAD coordinator replaced modular cable and controller with resolution of driveline communication fault signal. CT C/A/P c/w prior imaging w/ similar appearance of mild circumferential thickening of the fat plane surrounding the drive line in the upper abdomen extending from the pump to the skin which may be chronic or represent an infectious process. No drainable fluid collection.  - Started on empiric daptomycin/Unasyn per ID. Initial wound Cx + for Pseudomonas Aeruginosa (pan-sensitive) and S. aureus. Cefepime added. Now on Dapto, Cefepime and minocycline. BCx NGTD. Taken to OR 7/31 for wound debridement + application of wound vac. Returned to OR 8/7 for wound wash out/debridement - continue abx per  ID. Appreciate their assistance. ID recommends 4 weeks of daptomycin then Q 3 wks dalbavancin. Would have significant concern about him receiving IV abx at home with history of drug use and noncompliance. Would likely need to discharge to SNF if needs IV abx after discharge but he does not want to return to SNF. - site stable. Continue IV abx - will work with VAD team to see if we can do home IV abx with support from his sisters  2. Suspected DL fracture with multiple recent falls - Has had multiple falls during the last 2 months, one direct trauma to mid sternum (fell on cinder block) - CT chest/abd/pelvis per above  - CXR w/ LVAD partially imaged. The associated lead noted to be intact w/ no fracture or kink  - LVAD coordinators changed modular cable and controller with resolution  - No recurrent alarms  3.  Chronic systolic CHF s/p LVAD: Echo 10/21 with EF 20-25%, mildly decreased RV function. LHC/RHC in 12/21 with patent grafts, low output. Suspect mixed ischemic/nonischemic cardiomyopathy (prior heavy ETOH and drugs as well as CAD). Admitted with cardiogenic shock in 12/21, had placement of Impella 5.5 initially, now s/p Heartmate 3 LVAD on 11/17/20.  Ramp echo 2/23 with speed decreased to 5500 rpm. Speed now back to 5600 rpm.   - He is not volume overloaded on exam.      - Continue current Toprol XL 25 mg daily.  - MAPs up increase losartan to 25mg  daily  - He does not need a diuretic.   - INR 1.1 on admission. Suspect noncompliant with Warfarin. - INR 1.8 today. Off heparin. Discussed dosing with PharmD personally. -VAD interrogated personally. Parameters stable.  4. CKD stage 3: Prior CTA abdomen/pelvis with high-grade stenosis of proximal right renal artery.  - Would consider eventual reassessment of renal artery stenosis and possible treatment  - SCr stable   5.  CAD: S/p CABG 12/20.  LHC pre-VAD with patent grafts, no target for intervention. - Continue Crestor 20 mg daily.  - No  s/s angina  6.  Atrial fibrillation: Paroxysmal.  S/p Maze and LA appendage clip with CABG in 12/20. Maintaining SR.  - Continue Toprol XL 25 mg daily.  - Off amiodarone with hyperthyroidism.  7. Type 2 diabetes: improved control, Hgb A1c down from 12.1>>6.3  - continue SSI   8. 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 He says last use of methamphetamine was > 2 months ago. - UDS this admit negative (however collected > 48 hrs post admit)  - Declines substance use resources  9. 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  Followed by endocrinology. He is now off amiodarone. He had been on methimazole but has not been taking recently. TSH was high on admit and free T4 normal  - Will NOT restart methimazole   10. Anemia (chronic blood loss):  Hgb stable. No gross bleeding.  - follow CBP   11. COPD: Prior smoker.  - Continue Trelegy.   12. PVCs/NSVT:  - Now off amiodarone.   13. Depression: Has turned to substance abuse to cope.   - Has been referred to OP psych in [place]   14.  Gout: Continue allopurinol, he has prn colchicine.   15. HTN: MAPs improving. In setting of known Rt RAS. Hold off on ACE/ARB. - monitor closely, if remains elevated may need reassessment of renal artery stenosis and possible treatment. Significant calcifications in both renal arteries on CT abdomen this admit. - Continue Amlodipine 10 mg  - MAPs improved with addition of losartan 12.5 mg daily  16. Agitation: - Klonopin increased, agitation improved  17. Incisional hernia: -Continue abdominal binder   I reviewed the LVAD parameters from today, and compared the results to the patient's prior recorded data.  No programming changes were made.  The LVAD is functioning within specified parameters.  The patient performs LVAD self-test daily.  LVAD interrogation was negative for any significant power changes, alarms or PI events/speed drops.  LVAD equipment check completed and is  in good working order.  Back-up equipment present.   LVAD education done on emergency procedures and precautions and reviewed exit site care.  Length of Stay: 13  Arvilla Meres, MD 07/20/2023, 10:06 AM  VAD Team --- VAD ISSUES ONLY--- Pager [phone] (7am - 7am)  Advanced Heart Failure Team  Pager [phone] (M-F; 7a - 5p)  Please contact [facility] for night-coverage after hours (5p -7a ) and weekends on [email or web address]

## 2023-07-20 NOTE — Progress Notes (Addendum)
ANTICOAGULATION CONSULT NOTE  Pharmacy Consult for warfarin  Indication:  LVAD  Allergies  Allergen Reactions   Vancomycin Nausea And Vomiting and Other (See Comments)    Severe joint pain    Patient Measurements: Height: 5\' 10"  (177.8 cm) Weight: 93.8 kg (206 lb 12.7 oz) IBW/kg (Calculated) : 73 Heparin Dosing Weight: 91.4 kg   Vital Signs: Temp: 98 F (36.7 C) (08/11 1108) Temp Source: Oral (08/11 1108) BP: 117/93 (08/11 1108) Pulse Rate: 86 (08/11 0733)  Labs: Recent Labs    07/18/23 0621 07/19/23 0630 07/20/23 0625  HGB 11.2* 10.6* 10.2*  HCT 34.6* 33.3* 31.5*  PLT 189 171 162  LABPROT 17.5* 19.8* 21.3*  INR 1.4* 1.7* 1.8*  HEPARINUNFRC <0.10* <0.10*  --   CREATININE 1.06 1.10 1.09    Estimated Creatinine Clearance: 80.8 mL/min (by C-G formula based on SCr of 1.09 mg/dL).   Medical History: Past Medical History:  Diagnosis Date   "    Arthritis    CAD (coronary artery disease)    a. s/p CABG in 11/2019 with LIMA-LAD, SVG-OM1, SVG-PDA and SVG-D1   CHF (congestive heart failure) (HCC)    a. EF < 20% by echo in 11/2019   Essential hypertension    PAF (paroxysmal atrial fibrillation) (HCC)    Type 2 diabetes mellitus (HCC)     Medications:  Scheduled:   allopurinol  100 mg Oral Daily   amLODipine  10 mg Oral Daily   clonazepam  0.5 mg Oral BID   fluticasone furoate-vilanterol  1 puff Inhalation Daily   And   umeclidinium bromide  1 puff Inhalation Daily   gabapentin  300 mg Oral BID   insulin aspart  0-15 Units Subcutaneous TID WC   insulin aspart  0-5 Units Subcutaneous QHS   [START ON 07/21/2023] losartan  25 mg Oral Daily   metoprolol succinate  25 mg Oral Daily   minocycline  200 mg Oral BID   mirtazapine  15 mg Oral QHS   pantoprazole  40 mg Oral Daily   sertraline  50 mg Oral Daily   tamsulosin  0.4 mg Oral Daily   thiamine  100 mg Oral Daily   traZODone  150 mg Oral QHS   warfarin  6 mg Oral q1600   Warfarin - Pharmacist Dosing  Inpatient   Does not apply q1600   zinc sulfate  220 mg Oral Daily    Assessment: 62 yom who presented to clinic with concern for driveline fracture and infection. Last [facility] appointment was on 04/21/2023 - INR was subtherapeutic at 1.4 and regimen at that time was 6 mg daily except 4 mg Tues/Thurs (LD 7/25).  Given low INR on admit, started fixed rate heparin 500 units/hr - now stopped   INR bump to 1.8 after boost now that no more debridements planned CBC and LDH stable,  Goal of Therapy:  Heparin level <0.3 units/ml INR 2-2.5  - keeping lower INR goal ~1.5 given plan for debridement upcoming week Monitor platelets by anticoagulation protocol: Yes   Plan:  Warfarin 6mg  daily  Monitor daily heparin level, INR, CBC, LDH, and for s/sx of bleeding     Leota Sauers Pharm.D. CPP, BCPS Clinical Pharmacist [phone] 07/20/2023 1:26 PM   Please check AMION for all [facility] Pharmacy numbers 07/20/2023

## 2023-07-21 DIAGNOSIS — T827XXA Infection and inflammatory reaction due to other cardiac and vascular devices, implants and grafts, initial encounter: Secondary | ICD-10-CM | POA: Diagnosis not present

## 2023-07-21 LAB — GLUCOSE, CAPILLARY
Glucose-Capillary: 277 mg/dL — ABNORMAL HIGH (ref 70–99)
Glucose-Capillary: 203 mg/dL — ABNORMAL HIGH (ref 70–99)
Glucose-Capillary: 215 mg/dL — ABNORMAL HIGH (ref 70–99)
Glucose-Capillary: 250 mg/dL — ABNORMAL HIGH (ref 70–99)

## 2023-07-21 MED ORDER — INSULIN ASPART 100 UNIT/ML IJ SOLN
0.0000 [IU] | Freq: Every day | INTRAMUSCULAR | Status: DC
  Administered 2023-07-21: 2 [IU] via SUBCUTANEOUS
  Administered 2023-07-22: 3 [IU] via SUBCUTANEOUS
  Administered 2023-07-30: 2 [IU] via SUBCUTANEOUS

## 2023-07-21 MED ORDER — INSULIN ASPART 100 UNIT/ML IJ SOLN
0.0000 [IU] | Freq: Three times a day (TID) | INTRAMUSCULAR | Status: DC
  Administered 2023-07-21: 7 [IU] via SUBCUTANEOUS
  Administered 2023-07-22: 11 [IU] via SUBCUTANEOUS
  Administered 2023-07-23: 4 [IU] via SUBCUTANEOUS
  Administered 2023-07-23: 7 [IU] via SUBCUTANEOUS
  Administered 2023-07-23: 11 [IU] via SUBCUTANEOUS
  Administered 2023-07-24: 7 [IU] via SUBCUTANEOUS
  Administered 2023-07-24 (×2): 3 [IU] via SUBCUTANEOUS
  Administered 2023-07-25: 7 [IU] via SUBCUTANEOUS
  Administered 2023-07-25 – 2023-07-26 (×2): 4 [IU] via SUBCUTANEOUS
  Administered 2023-07-26 – 2023-07-27 (×2): 7 [IU] via SUBCUTANEOUS
  Administered 2023-07-27 – 2023-07-28 (×2): 4 [IU] via SUBCUTANEOUS
  Administered 2023-07-28: 7 [IU] via SUBCUTANEOUS
  Administered 2023-07-28: 3 [IU] via SUBCUTANEOUS
  Administered 2023-07-29 (×3): 4 [IU] via SUBCUTANEOUS
  Administered 2023-07-30: 7 [IU] via SUBCUTANEOUS
  Administered 2023-07-30 – 2023-07-31 (×3): 3 [IU] via SUBCUTANEOUS
  Administered 2023-07-31: 5 [IU] via SUBCUTANEOUS
  Administered 2023-08-01: 3 [IU] via SUBCUTANEOUS
  Administered 2023-08-01: 4 [IU] via SUBCUTANEOUS
  Administered 2023-08-02 – 2023-08-03 (×2): 3 [IU] via SUBCUTANEOUS
  Administered 2023-08-03: 4 [IU] via SUBCUTANEOUS
  Administered 2023-08-04: 3 [IU] via SUBCUTANEOUS
  Administered 2023-08-04: 4 [IU] via SUBCUTANEOUS
  Administered 2023-08-05 – 2023-08-06 (×3): 3 [IU] via SUBCUTANEOUS
  Administered 2023-08-07: 1 [IU] via SUBCUTANEOUS
  Administered 2023-08-07 – 2023-08-11 (×7): 3 [IU] via SUBCUTANEOUS
  Administered 2023-08-11: 4 [IU] via SUBCUTANEOUS
  Administered 2023-08-12 (×2): 3 [IU] via SUBCUTANEOUS
  Administered 2023-08-13 – 2023-08-14 (×3): 4 [IU] via SUBCUTANEOUS
  Administered 2023-08-14: 3 [IU] via SUBCUTANEOUS
  Administered 2023-08-15: 4 [IU] via SUBCUTANEOUS
  Administered 2023-08-15: 3 [IU] via SUBCUTANEOUS

## 2023-07-21 NOTE — Discharge Summary (Cosign Needed Addendum)
Advanced Heart Failure Team  Discharge Summary   Patient ID: Kevin Underwood. MRN: [id number], DOB/AGE: Aug 18, 1961 61 y.o. Admit date: 07/07/2023 D/C date:     08/15/2023   Primary Discharge Diagnoses:  LVAD driveline infection Suspected driveline fracture Chronic systolic CHF Incisional hernia   Hospital Course:   Kevin Underwood. Is a 62 y.o. male with history of systolic HF, multivessel CAD status post CABG in December 2020 (with Maze and LAA clipping) at which point he required Impella support due to cardiogenic shock, paroxysmal atrial fibrillation, type 2 diabetes mellitus, COPD, and hypertension.    He was admitted in 12/21 with cardiogenic shock.  Cath showed patent grafts and low cardiac output. He required stabilization with Impella 5.5 and ultimately had placement of Heartmate 3 LVAD.  Paroxysmal atrial fibrillation noted post-op.    Patient had a prolonged admission in 6/23 and again in 7/23-8/23 with MRSA driveline infection.  He went to the OR multiple times for debridement and wound vac changes.  He completed vancomycin and was placed on doxycycline.    Levofloxacin added d/t increased drainage and redness after lf/u on 09/04/22. Wound culture grew acinetobacter.  Grew acinetobacter and MRSA on repeat culture. ID recommended switching to minocycline 200 BID.   Patient was admitted with recurrent MRSA driveline site infection in 2/24.  He went to the OR several times for debridement and wound vac management. He was on daptomycin + minocycline, completed daptomycin at SNF.    Patient admitted on 07/07/23 with possible driveline fracture and recurrent driveline infection. Multiple falls noted at home. Had been receiving "driveline fault" alarms for about 2 months. Alarms resolved after changing modular cable and control. Wound cultures grew Pseudomonas aeruginosa and Staph aureus. ID consulted and he was treated with 4 wks of IV abx w/ Dapto, Cefepime, and minocycline. Had 2  visits to OR for wound debridement. He was followed by Dr. Maren Beach, no additional surgical management felt to be necessary. ID recommended PO minocycline and ciprofloxacin to complete 6 weeks course. EOT 08/28/23. Then can continue minocycline for suppression of MRSA along with Acinetobacter and reassess need of ciprofloxacin for PsA suppression.   During hospitalization complained of abdominal discomfort at site of large incisional hernia. Given size, not felt to be a candidate for repair. Has been wearing abdominal binder.  Had several adjustments to BP regimen for elevated MAPs.  The patient was given a 7 day supply of Oxycodone and Tramadol to give him enough time to establish with pain clinic (unable to give more than 7 days under [place] law).   Has f/u in VAD clinic with INR on 09/11 and f/u with ID on 09/16.   Medications sent to [facility] pharmacy.   HH arranged by HF TOC CM/CSW.   LVAD Interrogation HM II:   Speed: 5600 Flow: 4.8     PI: 3.6     Power: 4      Back-up speed: 5300      Discharge Vitals: Blood pressure (!) 136/99, pulse 80, temperature 98 F (36.7 C), temperature source Oral, resp. rate 11, height 5\' 10"  (1.778 m), weight 96.8 kg, SpO2 98%.  Labs: Lab Results  Component Value Date   WBC 8.3 08/12/2023   HGB 11.5 (L) 08/12/2023   HCT 35.6 (L) 08/12/2023   MCV 82.4 08/12/2023   PLT 287 08/12/2023    Recent Labs  Lab 08/15/23 0606  NA 139  K 4.2  CL 102  CO2 25  BUN 28*  CREATININE 1.21  CALCIUM 9.1  GLUCOSE 136*   Lab Results  Component Value Date   CHOL 175 07/09/2023   HDL 45 07/09/2023   LDLCALC 91 07/09/2023   TRIG 193 (H) 07/09/2023   BNP (last 3 results) No results for input(s): "BNP" in the last 8760 hours.  ProBNP (last 3 results) No results for input(s): "PROBNP" in the last 8760 hours.   Diagnostic Studies/Procedures   CT C/A/P 07/07/23: IMPRESSION: 1. Similar appearance of mild circumferential thickening of the fat plane surrounding  the drive line in the upper abdomen extending from the pump to the skin which may be chronic or represent an infectious process. No drainable fluid collection. 2. No acute intrathoracic, abdominal, or pelvic pathology. 3. Sigmoid diverticulosis. No bowel obstruction. Normal appendix. 4. A 2 mm nonobstructing left renal upper pole calculus. No hydronephrosis or obstructing stone. 5. Aortic Atherosclerosis (ICD10-I70.0) and Emphysema (ICD10-J43.9)  Discharge Medications   Allergies as of 08/15/2023       Reactions   Vancomycin Nausea And Vomiting, Other (See Comments)   Severe joint pain        Medication List     STOP taking these medications    feeding supplement (GLUCERNA SHAKE) Liqd   gabapentin 600 MG tablet Commonly known as: NEURONTIN Replaced by: gabapentin 300 MG capsule   methimazole 5 MG tablet Commonly known as: TAPAZOLE   triamcinolone ointment 0.5 % Commonly known as: KENALOG       TAKE these medications    acetaminophen 325 MG tablet Commonly known as: TYLENOL Take 2 tablets (650 mg total) by mouth every 4 (four) hours as needed for headache or mild pain.   albuterol 108 (90 Base) MCG/ACT inhaler Commonly known as: VENTOLIN HFA Inhale 2 puffs into the lungs every 6 (six) hours as needed for wheezing or shortness of breath. What changed: Another medication with the same name was changed. Make sure you understand how and when to take each.   albuterol (2.5 MG/3ML) 0.083% nebulizer solution Commonly known as: PROVENTIL INHALE 3 ML BY NEBULIZATION EVERY 6 HOURS AS NEEDED FOR WHEEZING OR SHORTNESS OF BREATH What changed: See the new instructions.   allopurinol 100 MG tablet Commonly known as: ZYLOPRIM Take 1 tablet (100 mg total) by mouth daily.   amLODipine 10 MG tablet Commonly known as: NORVASC Take 1 tablet (10 mg total) by mouth daily. Start taking on: August 16, 2023   ciprofloxacin 750 MG tablet Commonly known as: CIPRO Take 1 tablet  (750 mg total) by mouth 2 (two) times daily.   clonazePAM 0.25 MG disintegrating tablet Commonly known as: KLONOPIN Take 1 tablet (0.25 mg total) by mouth 2 (two) times daily.   colchicine 0.6 MG tablet Take 1 tablet (0.6 mg total) by mouth daily as needed (gout pain).   Combivent Respimat 20-100 MCG/ACT Aers respimat Generic drug: Ipratropium-Albuterol Inhale 1 puff into the lungs every 6 (six) hours.   Flonase Allergy Relief 50 MCG/ACT nasal spray Generic drug: fluticasone Place 2 sprays into both nostrils as needed for allergies or rhinitis.   gabapentin 300 MG capsule Commonly known as: NEURONTIN Take 1 capsule (300 mg total) by mouth 3 (three) times daily. Replaces: gabapentin 600 MG tablet   glipiZIDE 5 MG tablet Commonly known as: Glucotrol Take 1 tablet (5 mg total) by mouth daily.   guaifenesin 400 MG Tabs tablet Commonly known as: HUMIBID E Take 0.4 mg by mouth every 6 (six) hours as needed (for congestion).   loratadine 10  MG tablet Commonly known as: CLARITIN Take 1 tablet (10 mg total) by mouth daily.   losartan 50 MG tablet Commonly known as: COZAAR Take 1 tablet (50 mg total) by mouth daily. Start taking on: August 16, 2023   metFORMIN 1000 MG tablet Commonly known as: GLUCOPHAGE Take 1 tablet (1,000 mg total) by mouth 2 (two) times daily. What changed: how much to take   metoprolol succinate 25 MG 24 hr tablet Commonly known as: Toprol XL Take 1 tablet (25 mg total) by mouth daily. Start taking on: August 16, 2023   minocycline 100 MG capsule Commonly known as: MINOCIN Take 2 capsules (200 mg total) by mouth 2 (two) times daily.   mirtazapine 15 MG tablet Commonly known as: REMERON Take 1 tablet (15 mg total) by mouth at bedtime.   multivitamin tablet Take 1 tablet by mouth daily.   multivitamin with minerals Tabs tablet Take 1 tablet by mouth daily.   Oxycodone HCl 10 MG Tabs Take 1 tablet (10 mg total) by mouth every 6 (six) hours  as needed.   pantoprazole 40 MG tablet Commonly known as: PROTONIX Take 1 tablet (40 mg total) by mouth daily.   rosuvastatin 20 MG tablet Commonly known as: CRESTOR Take 1 tablet (20 mg total) by mouth daily.   sertraline 50 MG tablet Commonly known as: ZOLOFT Take 1 tablet (50 mg total) by mouth daily.   spironolactone 25 MG tablet Commonly known as: ALDACTONE Take 0.5 tablets (12.5 mg total) by mouth daily. Start taking on: August 16, 2023   tamsulosin 0.4 MG Caps capsule Commonly known as: FLOMAX Take 1 capsule (0.4 mg total) by mouth daily.   thiamine 100 MG tablet Commonly known as: Vitamin B-1 Take 1 tablet (100 mg total) by mouth daily.   traMADol 50 MG tablet Commonly known as: ULTRAM Take 2 tablets (100 mg total) by mouth every 12 (twelve) hours as needed. What changed:  how much to take when to take this reasons to take this   traZODone 150 MG tablet Commonly known as: DESYREL Take 1 tablet (150 mg total) by mouth at bedtime.   Trelegy Ellipta 100-62.5-25 MCG/ACT Aepb Generic drug: Fluticasone-Umeclidin-Vilant Inhale 1 puff into the lungs daily.   warfarin 4 MG tablet Commonly known as: COUMADIN Take as directed. If you are unsure how to take this medication, talk to your nurse or doctor. Original instructions: Take 1-1.5 tablets (4-6 mg total) by mouth See admin instructions. 4 mg every Tuesday and Thursday. 6 mg all other days What changed: additional instructions   zinc sulfate 220 (50 Zn) MG capsule Take 1 capsule (220 mg total) by mouth daily.               Durable Medical Equipment  (From admission, onward)           Start     Ordered   07/31/23 0850  For home use only DME Hospital bed  Once       Comments: Standard mattress for hospital bed  Question Answer Comment  Length of Need Lifetime   Patient has (list medical condition): heart failure, LVAD   The above medical condition requires: Patient requires the ability to  reposition frequently   Head must be elevated greater than: 45 degrees   Bed type Semi-electric   Trapeze Bar Yes   Support Surface: Gel Overlay      07/31/23 0849   07/31/23 0850  Heart failure home health orders  (Heart failure home  health orders / Face to face)  Once       Comments: Heart Failure Follow-up Care:  Verify follow-up appointments per Patient Discharge Instructions. Confirm transportation arranged. Reconcile home medications with discharge medication list. Remove discontinued medications from use. Assist patient/caregiver to manage medications using pill box. Reinforce low sodium food selection Assessments: Vital signs and oxygen saturation at each visit. Assess home environment for safety concerns, caregiver support and availability of low-sodium foods. Consult Child psychotherapist, PT/OT, Dietitian, and CNA based on assessments. Perform comprehensive cardiopulmonary assessment. Notify MD for any change in condition or weight gain of 3 pounds in one day or 5 pounds in one week with symptoms. Daily Weights and Symptom Monitoring: Ensure patient has access to scales. Teach patient/caregiver to weigh daily before breakfast and after voiding using same scale and record.    Teach patient/caregiver to track weight and symptoms and when to notify Provider. Activity: Develop individualized activity plan with patient/caregiver.   Question Answer Comment  Heart Failure Follow-up Care Advanced Heart Failure (AHF) Clinic at [phone]   Obtain the following labs Other see comments   Lab frequency Other see comments   Fax lab results to AHF Clinic at [phone]   Diet Low Sodium Heart Healthy   Fluid restrictions: 1800 mL Fluid      07/31/23 0850   07/31/23 0723  For home use only DME Hospital bed  Once       Comments: Standard mattress for hospital bed  Question Answer Comment  Length of Need Lifetime   Patient has (list medical condition): heart failure, LVAD   The above  medical condition requires: Patient requires the ability to reposition frequently   Head must be elevated greater than: 45 degrees   Bed type Semi-electric   Trapeze Bar Yes   Support Surface: Gel Overlay      07/31/23 0723            Disposition   The patient will be discharged in stable condition to home. Discharge Instructions     Diet - low sodium heart healthy   Complete by: As directed    Diet - low sodium heart healthy   Complete by: As directed    Increase activity slowly   Complete by: As directed    Increase activity slowly   Complete by: As directed    Page VAD Coordinator at [phone]  Notify for: any VAD alarms, sustained elevations of power >10 watts, sustained drop in Pulse Index <3   Complete by: As directed    Notify for:  any VAD alarms sustained elevations of power >10 watts sustained drop in Pulse Index <3         Follow-up Information     [facility], [facility] Follow up.   Specialty: Home Health Services Why: Your home health provider they will call to schedule Contact information: [address] [place] [address] [phone]                   Duration of Discharge Encounter: Greater than 35 minutes   Signed, Elzia Hott N, PA-C  08/15/2023, 12:20 PM

## 2023-07-21 NOTE — Plan of Care (Signed)
  Problem: Education: Goal: Patient will understand all VAD equipment and how it functions Outcome: Progressing Goal: Patient will be able to verbalize current INR target range and antiplatelet therapy for discharge home Outcome: Progressing   Problem: Cardiac: Goal: LVAD will function as expected and patient will experience no clinical alarms Outcome: Progressing   Problem: Education: Goal: Knowledge of General Education information will improve Description: Including pain rating scale, medication(s)/side effects and non-pharmacologic comfort measures Outcome: Progressing   Problem: Health Behavior/Discharge Planning: Goal: Ability to manage health-related needs will improve Outcome: Progressing   Problem: Clinical Measurements: Goal: Ability to maintain clinical measurements within normal limits will improve Outcome: Progressing Goal: Will remain free from infection Outcome: Progressing Goal: Diagnostic test results will improve Outcome: Progressing Goal: Respiratory complications will improve Outcome: Progressing Goal: Cardiovascular complication will be avoided Outcome: Progressing   Problem: Activity: Goal: Risk for activity intolerance will decrease Outcome: Progressing   Problem: Nutrition: Goal: Adequate nutrition will be maintained Outcome: Progressing   Problem: Coping: Goal: Level of anxiety will decrease Outcome: Progressing   Problem: Elimination: Goal: Will not experience complications related to bowel motility Outcome: Progressing Goal: Will not experience complications related to urinary retention Outcome: Progressing   Problem: Pain Managment: Goal: General experience of comfort will improve Outcome: Progressing   Problem: Safety: Goal: Ability to remain free from injury will improve Outcome: Progressing   Problem: Skin Integrity: Goal: Risk for impaired skin integrity will decrease Outcome: Progressing   Problem: Education: Goal: Ability  to describe self-care measures that may prevent or decrease complications (Diabetes Survival Skills Education) will improve Outcome: Progressing Goal: Individualized Educational Video(s) Outcome: Progressing   Problem: Coping: Goal: Ability to adjust to condition or change in health will improve Outcome: Progressing   Problem: Fluid Volume: Goal: Ability to maintain a balanced intake and output will improve Outcome: Progressing   Problem: Health Behavior/Discharge Planning: Goal: Ability to identify and utilize available resources and services will improve Outcome: Progressing Goal: Ability to manage health-related needs will improve Outcome: Progressing   Problem: Metabolic: Goal: Ability to maintain appropriate glucose levels will improve Outcome: Progressing   Problem: Nutritional: Goal: Maintenance of adequate nutrition will improve Outcome: Progressing Goal: Progress toward achieving an optimal weight will improve Outcome: Progressing   Problem: Skin Integrity: Goal: Risk for impaired skin integrity will decrease Outcome: Progressing   Problem: Tissue Perfusion: Goal: Adequacy of tissue perfusion will improve Outcome: Progressing   

## 2023-07-21 NOTE — Progress Notes (Signed)
ANTICOAGULATION CONSULT NOTE  Pharmacy Consult for warfarin  Indication:  LVAD  Allergies  Allergen Reactions   Vancomycin Nausea And Vomiting and Other (See Comments)    Severe joint pain    Patient Measurements: Height: 5\' 10"  (177.8 cm) Weight: 91.6 kg (201 lb 15.1 oz) IBW/kg (Calculated) : 73 Heparin Dosing Weight: 91.4 kg   Vital Signs: Temp: 97.7 F (36.5 C) (08/12 0732) Temp Source: Oral (08/12 0732) BP: 119/74 (08/12 0914) Pulse Rate: 79 (08/12 0914)  Labs: Recent Labs    07/19/23 0630 07/20/23 0625 07/21/23 0625  HGB 10.6* 10.2* 11.3*  HCT 33.3* 31.5* 35.7*  PLT 171 162 175  LABPROT 19.8* 21.3* 21.4*  INR 1.7* 1.8* 1.8*  HEPARINUNFRC <0.10*  --   --   CREATININE 1.10 1.09 1.25*    Estimated Creatinine Clearance: 69.7 mL/min (A) (by C-G formula based on SCr of 1.25 mg/dL (H)).   Medical History: Past Medical History:  Diagnosis Date   "    Arthritis    CAD (coronary artery disease)    a. s/p CABG in 11/2019 with LIMA-LAD, SVG-OM1, SVG-PDA and SVG-D1   CHF (congestive heart failure) (HCC)    a. EF < 20% by echo in 11/2019   Essential hypertension    PAF (paroxysmal atrial fibrillation) (HCC)    Type 2 diabetes mellitus (HCC)     Medications:  Scheduled:   allopurinol  100 mg Oral Daily   amLODipine  10 mg Oral Daily   clonazepam  0.5 mg Oral BID   fluticasone furoate-vilanterol  1 puff Inhalation Daily   And   umeclidinium bromide  1 puff Inhalation Daily   gabapentin  300 mg Oral BID   insulin aspart  0-15 Units Subcutaneous TID WC   insulin aspart  0-5 Units Subcutaneous QHS   losartan  25 mg Oral Daily   metoprolol succinate  25 mg Oral Daily   minocycline  200 mg Oral BID   mirtazapine  15 mg Oral QHS   pantoprazole  40 mg Oral Daily   sertraline  50 mg Oral Daily   tamsulosin  0.4 mg Oral Daily   thiamine  100 mg Oral Daily   traZODone  150 mg Oral QHS   warfarin  6 mg Oral q1600   Warfarin - Pharmacist Dosing Inpatient   Does  not apply q1600   zinc sulfate  220 mg Oral Daily    Assessment: 62 yom who presented to clinic with concern for driveline fracture and infection. Last [facility] appointment was on 04/21/2023 - INR was subtherapeutic at 1.4 and regimen at that time was 6 mg daily except 4 mg Tues/Thurs (LD 7/25).  Given low INR on admit, started fixed rate heparin 500 units/hr - now stopped. INR is therapeutic at 1.8 - Hgb 11.3, plt 175. LDH 155. No s/sx of bleeding. No further debridements.  Goal of Therapy:  INR 2-2.5 Monitor platelets by anticoagulation protocol: Yes   Plan:  Warfarin 6mg  daily  Monitor daily heparin level, INR, CBC, LDH, and for s/sx of bleeding   Thank you for allowing pharmacy to participate in this patient's care,  Sherron Monday, PharmD, BCCCP Clinical Pharmacist  Phone: [phone] 07/21/2023 9:41 AM  Please check AMION for all [facility] Pharmacy phone numbers After 10:00 PM, call Main Pharmacy [phone]

## 2023-07-21 NOTE — Progress Notes (Addendum)
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 Started on IV antibiotics.   Patient presented to VAD clinic 7/29 for driveline communication fault- stating that he has had "alarms every couple hrs from his VAD for a couple months." Pt has no showed and cancelled all appointments in the past 2 months. Pt also had green/blue drainage visible through his driveline bandage. (Dressing change and wound cx obtained). Pt states that he has experienced dizziness associated with a fall multiple times over the past couple months but no signs of bleeding. Controller and modular cable exchanged. No further communication fault alarms noted.   CT Chest/Abd/Pelvis 7/29 1. Similar appearance of mild circumferential thickening of the fat plane surrounding the drive line in the upper abdomen extending from the pump to the skin which may be chronic or represent an infectious process. No drainable fluid collection.  Pt had drive line debridement in OR with placement of instillation wound vac on 07/09/23 with Dr Donata Clay. This was change to wet/dry on Friday 8/2 due to ongoing alarms related to the pts hernia. Plan to return to OR for washout today.   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 Currently on IV Cefepime Q 8 hrs and IV Daptomycin 800 mg daily per ID team. ID team is recommending 4 weeks IV antibiotics. Pt is refusing to go a facility for IV antibiotics.   Pt laying in bed. Frustrated with abdominal hernia stating he does not want to have to wear an abdominal binder all the time. Denies pain.   Vital signs: Temp: 97.7 HR: 78 Doppler Pressure: 106 Automatic BP: 119/74 (87) O2 Sat: 96% on RA Wt: 193.3>194.8>196.4>194.7>197.9>198.6>198.2>200.8>201>201.9 lbs  LVAD interrogation reveals:  Speed: 5600 Flow: 4.1 Power: 4.3 w PI: 3.7 Alarms: none Events: 24 PI events so far  today Hct: 36  Fixed speed:  5600 Low speed limit:  5300  Drive Line: Existing VAD dressing removed and site care performed using sterile technique. Wound bed cleansed with VASHE solution and sterile 4x4 gauze through light manual debridement. Skin surrounding wound bed cleaned with Chlora prep applicators x 2, allowed to dry. VASHE moistened 4 x 4 placed in wound bed, covered with several dry 4 x 4s.  Drive line unincorporated. Moderate amount of serosanguinous/yellow drainage noted. No redness, tenderness, foul odor or rash noted. Drive line anchor secured.   Continue daily wet to dry dressing changes using VASHE solution per bedside RN or VAD coordinator. Next dressing change due 07/22/23 by bedside nurse.      Labs:  LDH trend: 121>133>130>138>172>175>165>158>169>155  INR trend: 1.0>1.1>1.2>1.4>1.3>1.4>1.5>1.4>1.8  Anticoagulation Plan: -INR Goal: 2.0 - 2.5 -ASA Dose: none  ICD: N/A  Infection:  07/07/23>> blood cultures>> no growth 5 days; final  07/07/23>> wound cx>> few pseudomonas aeruginosa and MRSA; final  07/09/23>> OR driveline cx>> Few pseudomonas aeruginosa and METHICILLIN RESISTANT STAPHYLOCOCCUS AUREUS 07/09/23>> blood cultures>>no growth x 5 days; final  Drips:    Plan/Recommendations:  Page VAD coordinator with equipment issues or driveline problems Daily drive line wet to dry dressing changes with VASHE solution.   Alyce Pagan RN VAD Coordinator  Office: [phone]  24/7 Pager: [phone]

## 2023-07-21 NOTE — Progress Notes (Addendum)
Advanced Heart Failure VAD Team Note  PCP-Cardiologist: Nona Dell, MD  [facility]: Dr. Shirlee Latch   Subjective:    7/29: admitted w/ suspected DL fracture and DL infection. P/w "driveline communication fault" error message on LVAD. No pump stoppages. LVAD coordinator replaced modular cable and controller with resolution of driveline communication fault signal. CT C/A/P c/w prior imaging w/ similar appearance of mild circumferential thickening of the fat plane surrounding the drive line in the upper abdomen extending from the pump to the skin which may be chronic or represent an infectious process. No drainable fluid collection. Wound + BCx collected. Started on empiric daptomycin/Unasyn per ID.  7/30: Wound Cx growing Pseudomonas Aeruginosa (pan-sensitive) and S. aureus. Cefepime added 7/31: OR for wound debridement + application of wound vac.   8/8: Returned to OR for I&D 8/11: Losartan increased to 25 mg daily  Remains on Dapto, Cefepime and minocycline. Surgical wound with pseudomonas aeruginosa and MRSA. Bcx from 7/29 and 7/31 NG.  INR 1.8   Complaining of abdominal discomfort. Wants hernia repaired.   LVAD INTERROGATION:  HeartMate III LVAD:   Flow 4.3 liters/min, speed 5600, power 4.0, PI 3.2 <~ 20 PI events.   Objective:    Vital Signs:   Temp:  [97.7 F (36.5 C)-98.3 F (36.8 C)] 97.9 F (36.6 C) (08/12 0614) Pulse Rate:  [77-86] 77 (08/11 1551) Resp:  [13-17] 13 (08/12 0614) BP: (113-127)/(90-99) 116/99 (08/12 0614) SpO2:  [96 %-100 %] 96 % (08/12 0614) Weight:  [91.6 kg] 91.6 kg (08/12 0614) Last BM Date : 07/20/23 Mean arterial Pressure 99-100s  Intake/Output:   Intake/Output Summary (Last 24 hours) at 07/21/2023 0709 Last data filed at 07/21/2023 0000 Gross per 24 hour  Intake 606 ml  Output 800 ml  Net -194 ml     Physical Exam  Physical Exam: GENERAL: No acute distress. Standing in the room.  HEENT: normal  NECK: Supple, JVP  flat.  2+ bilaterally, no  bruits.  No lymphadenopathy or thyromegaly appreciated.   CARDIAC:  Mechanical heart sounds with LVAD hum present.  LUNGS:  Clear to auscultation bilaterally.  ABDOMEN:  Abdominal binder. + Hernia . Soft, round, nontender, positive bowel sounds x4.     LVAD exit site:  Dressing dry and intact.  No erythema or drainage.  Stabilization device present and accurately applied.   EXTREMITIES:  Warm and dry, no cyanosis, clubbing, rash or edema  NEUROLOGIC:  Alert and oriented x 3.    No aphasia.  No dysarthria.  Affect pleasant.      Telemetry   SR 80s   Labs   Basic Metabolic Panel: Recent Labs  Lab 07/16/23 0528 07/17/23 0616 07/18/23 0621 07/19/23 0630 07/20/23 0625  NA 135 137 135 134* 132*  K 4.2 4.1 4.6 4.4 4.4  CL 101 102 98 99 101  CO2 23 26 26 24 23   GLUCOSE 189* 215* 232* 240* 271*  BUN 26* 25* 28* 24* 28*  CREATININE 1.05 1.07 1.06 1.10 1.09  CALCIUM 9.1 9.1 9.3 8.7* 8.5*  MG  --   --   --  1.8 1.9    Liver Function Tests: No results for input(s): "AST", "ALT", "ALKPHOS", "BILITOT", "PROT", "ALBUMIN" in the last 168 hours.  CBC: Recent Labs  Lab 07/16/23 0528 07/17/23 0616 07/18/23 0621 07/19/23 0630 07/20/23 0625  WBC 9.1 8.5 8.0 7.3 7.1  HGB 11.1* 11.2* 11.2* 10.6* 10.2*  HCT 34.1* 34.0* 34.6* 33.3* 31.5*  MCV 84.8 85.4 85.6 86.3 88.2  PLT  199 192 189 171 162    INR: Recent Labs  Lab 07/16/23 0528 07/17/23 0616 07/18/23 0621 07/19/23 0630 07/20/23 0625  INR 1.5* 1.4* 1.4* 1.7* 1.8*    Other results:    Imaging   No results found.   Medications:     Scheduled Medications:  allopurinol  100 mg Oral Daily   amLODipine  10 mg Oral Daily   clonazepam  0.5 mg Oral BID   fluticasone furoate-vilanterol  1 puff Inhalation Daily   And   umeclidinium bromide  1 puff Inhalation Daily   gabapentin  300 mg Oral BID   insulin aspart  0-15 Units Subcutaneous TID WC   insulin aspart  0-5 Units Subcutaneous QHS   losartan  25 mg Oral Daily    metoprolol succinate  25 mg Oral Daily   minocycline  200 mg Oral BID   mirtazapine  15 mg Oral QHS   pantoprazole  40 mg Oral Daily   sertraline  50 mg Oral Daily   tamsulosin  0.4 mg Oral Daily   thiamine  100 mg Oral Daily   traZODone  150 mg Oral QHS   warfarin  6 mg Oral q1600   Warfarin - Pharmacist Dosing Inpatient   Does not apply q1600   zinc sulfate  220 mg Oral Daily    Infusions:  ceFEPime (MAXIPIME) IV 2 g (07/21/23 4098)   DAPTOmycin (CUBICIN) 800 mg in sodium chloride 0.9 % IVPB 800 mg (07/20/23 1350)    PRN Medications: acetaminophen, fluticasone, HYDROcodone-acetaminophen   Patient Profile  62 y.o. 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 Now s/p Heartmate 3 LVAD. Now admitted w/ suspected DL fracture after multiple falls and recurrent DL infection.  Assessment/Plan:    1.  LVAD Driveline infection: MRSA driveline infection, admitted in 6/23 and again in 7/23-8/23 with multiple trips to the OR for debridement.  Cultures have grown acinetobacter and MRSA. He was readmitted with driveline infection in 2/24, completed course of daptomycin for MRSA. Now readmitted w/ suspected DL fracture and DL infection. P/w "driveline communication fault" error message on LVAD.LVAD coordinator replaced modular cable and controller with resolution of driveline communication fault signal. CT C/A/P c/w prior imaging w/ similar appearance of mild circumferential thickening of the fat plane surrounding the drive line in the upper abdomen extending from the pump to the skin which may be chronic or represent an infectious process. No drainable fluid collection.  - Started on empiric daptomycin/Unasyn per ID. Initial wound Cx + for Pseudomonas Aeruginosa (pan-sensitive) and S. aureus. Cefepime  added. Now on Dapto, Cefepime and minocycline. BCx NGTD. Taken to OR 7/31 for wound debridement + application of wound vac. Returned to OR 8/7 for wound wash out/debridement - continue abx per ID. Appreciate their assistance. ID recommends 4 weeks of daptomycin then Q 3 wks dalbavancin. Would have significant concern about him receiving IV abx at home with history of drug use and noncompliance. Would likely need to discharge to SNF if needs IV abx after discharge but he does not want to return to SNF. - site stable. Continue IV abx - will work with VAD team to see if we can do home IV abx with support from his sisters  2. Suspected DL fracture with multiple recent falls - Has had multiple falls during  the last 2 months, one direct trauma to mid sternum (fell on cinder block) - CT chest/abd/pelvis per above  - CXR w/ LVAD partially imaged. The associated lead noted to be intact w/ no fracture or kink  - LVAD coordinators changed modular cable and controller with resolution  - No alarms   3.  Chronic systolic CHF s/p LVAD: Echo 10/21 with EF 20-25%, mildly decreased RV function. LHC/RHC in 12/21 with patent grafts, low output. Suspect mixed ischemic/nonischemic cardiomyopathy (prior heavy ETOH and drugs as well as CAD). Admitted with cardiogenic shock in 12/21, had placement of Impella 5.5 initially, now s/p Heartmate 3 LVAD on 11/17/20.  Ramp echo 2/23 with speed decreased to 5500 rpm. Speed now back to 5600 rpm.   -Volume status stable.       - Continue current Toprol XL 25 mg daily.  -Maps remain elevated. Continue losartan to 25 mg daily.  Consider increasing losartan.  - INR 1.1 on admission. Suspect noncompliant with Warfarin. - INR 1.8 today. Discussed dosing with PharmD personally. -VAD interrogated personally. Parameters stable.  4. CKD stage 3: Prior CTA abdomen/pelvis with high-grade stenosis of proximal right renal artery.  - Would consider eventual reassessment of renal artery  stenosis and possible treatment  -BMET pending.   5.  CAD: S/p CABG 12/20.  LHC pre-VAD with patent grafts, no target for intervention. - Continue Crestor 20 mg daily.  - No s/s angina  6.  Atrial fibrillation: Paroxysmal.  S/p Maze and LA appendage clip with CABG in 12/20.  - Maintaining SR.  - Continue Toprol XL 25 mg daily.  - Off amiodarone with hyperthyroidism.  7. Type 2 diabetes: improved control, Hgb A1c down from 12.1>>6.3  - continue SSI   8. 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 He says last use of methamphetamine was > 2 months ago. - UDS this admit negative (however collected > 48 hrs post admit)  - Declines substance use resources  9. 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  Followed by endocrinology. He is now off amiodarone. He had been on methimazole but has not been taking recently. TSH was high on admit and free T4 normal  - Will NOT restart methimazole   10. Anemia (chronic blood loss):  Hgb stable.   11. COPD: Prior smoker.  - Continue Trelegy.   12. PVCs/NSVT:  - Now off amiodarone.   13. Depression: Has turned to substance abuse to cope.   - Has been referred to OP psych in [place]   14.  Gout: Continue allopurinol, he has prn colchicine.   15. HTN: MAPs 90-100s in the setting of known Rt RAS.  - monitor closely, if remains elevated may need reassessment of renal artery stenosis and possible treatment. Significant calcifications in both renal arteries on CT abdomen this admit. - Continue Amlodipine 10 mg + losartan 25 mg daily.  16. Agitation: - Klonopin increased, agitation improved  17. Incisional hernia: -Continue abdominal binder - Asking if he can get hernia repaired.    I reviewed the LVAD parameters from today, and compared the results to the patient's prior recorded data.  No programming changes were made.  The LVAD is functioning within specified parameters.  The patient performs LVAD self-test daily.   LVAD interrogation was negative for any significant power changes, alarms or PI events/speed drops.  LVAD equipment check completed and is in good working order.  Back-up equipment present.   LVAD education done on emergency procedures and precautions and reviewed  exit site care.  Length of Stay: 14  Amy Filbert Schilder, NP 07/21/2023, 7:09 AM  VAD Team --- VAD ISSUES ONLY--- Pager [phone] (7am - 7am)  Advanced Heart Failure Team  Pager [phone] (M-F; 7a - 5p)  Please contact [facility] for night-coverage after hours (5p -7a ) and weekends on [email or web address]   Patient seen and examined with the above-signed Advanced Practice Provider and/or Housestaff. I personally reviewed laboratory data, imaging studies and relevant notes. I independently examined the patient and formulated the important aspects of the plan. I have edited the note to reflect any of my changes or salient points. I have personally discussed the plan with the patient and/or family.  Remains on IV abx. No f/c. Having pain around hernia site.   INR 1.8  General:  NAD.  HEENT: normal  Neck: supple. JVP not elevated.  Carotids 2+ bilat; no bruits. No lymphadenopathy or thryomegaly appreciated. Cor: LVAD hum.  Lungs: Clear. Abdomen: soft, nontender, non-distended. No hepatosplenomegaly. Huge incisional hernia. Wound dressing ok  Good bowel sounds. Driveline site clean. Anchor in place.  Extremities: no cyanosis, clubbing, rash. Warm no edema  Neuro: alert & oriented x 3. No focal deficits. Moves all 4 without problem   Continue IV abx. Given size of incisional hernia likely not candidate for repair. Will d/w TCTS.  VAD interrogated personally. Parameters stable.  INR 1.8 Discussed dosing with PharmD personally.  Arvilla Meres, MD  8:44 AM

## 2023-07-22 DIAGNOSIS — T827XXA Infection and inflammatory reaction due to other cardiac and vascular devices, implants and grafts, initial encounter: Secondary | ICD-10-CM | POA: Diagnosis not present

## 2023-07-22 LAB — GLUCOSE, CAPILLARY
Glucose-Capillary: 262 mg/dL — ABNORMAL HIGH (ref 70–99)
Glucose-Capillary: 105 mg/dL — ABNORMAL HIGH (ref 70–99)
Glucose-Capillary: 153 mg/dL — ABNORMAL HIGH (ref 70–99)
Glucose-Capillary: 251 mg/dL — ABNORMAL HIGH (ref 70–99)

## 2023-07-22 MED ORDER — WARFARIN SODIUM 4 MG PO TABS
4.0000 mg | ORAL_TABLET | Freq: Once | ORAL | Status: AC
  Administered 2023-07-22: 4 mg via ORAL
  Filled 2023-07-22: qty 1

## 2023-07-22 MED ORDER — INSULIN GLARGINE-YFGN 100 UNIT/ML ~~LOC~~ SOLN
14.0000 [IU] | Freq: Every day | SUBCUTANEOUS | Status: DC
  Administered 2023-07-22 – 2023-08-09 (×19): 14 [IU] via SUBCUTANEOUS
  Filled 2023-07-22 (×19): qty 0.14

## 2023-07-22 MED ORDER — INSULIN STARTER KIT- PEN NEEDLES (ENGLISH)
1.0000 | Freq: Once | Status: AC
  Administered 2023-07-22: 1
  Filled 2023-07-22: qty 1

## 2023-07-22 NOTE — Progress Notes (Signed)
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 Started on IV antibiotics.   Patient presented to VAD clinic 7/29 for driveline communication fault- stating that he has had "alarms every couple hrs from his VAD for a couple months." Pt has no showed and cancelled all appointments in the past 2 months. Pt also had green/blue drainage visible through his driveline bandage. (Dressing change and wound cx obtained). Pt states that he has experienced dizziness associated with a fall multiple times over the past couple months but no signs of bleeding. Controller and modular cable exchanged. No further communication fault alarms noted.   CT Chest/Abd/Pelvis 7/29 1. Similar appearance of mild circumferential thickening of the fat plane surrounding the drive line in the upper abdomen extending from the pump to the skin which may be chronic or represent an infectious process. No drainable fluid collection.  Pt had drive line debridement in OR with placement of instillation wound vac on 07/09/23 with Dr Donata Clay. This was change to wet/dry on Friday 8/2 due to ongoing alarms related to the pts hernia. Plan to return to OR for washout today.   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 Currently on IV Cefepime Q 8 hrs and IV Daptomycin 800 mg daily per ID team. ID team is recommending 4 weeks IV antibiotics. Pt is refusing to go a facility for IV antibiotics.   Sitting up on side of bed. Denies complaints. Says he is not going to wear his abdominal binder anymore as it is hot and "not really helping."   Pt states he plans to discharge home with his wife. He would like her to be trained on antibiotic administration if he is deemed appropriate for discharge with PICC.   Vital signs: Temp: 97.7 HR: 91 Doppler Pressure: 100 Automatic BP: 113/91 (100) O2 Sat: 98% on RA Wt:  193.3>194.8>196.4>194.7>197.9>198.6>198.2>200.8>201>201.9>205.5 lbs  LVAD interrogation reveals:  Speed: 5600 Flow: 4.1 Power: 4.2 w PI: 3.5 Alarms: none Events: on batteries Hct: 36  Fixed speed:  5600 Low speed limit:  5300  Drive Line: Existing VAD dressing removed and site care performed using sterile technique. Wound bed cleansed with VASHE solution and sterile 4x4 gauze through light manual debridement. Skin surrounding wound bed cleaned with Chlora prep applicators x 2, allowed to dry. VASHE moistened 4 x 4 placed in wound bed, covered with several dry 4 x 4s.  Drive line unincorporated. Moderate amount of thick serosanguinous/yellow drainage noted. No redness, tenderness, foul odor or rash noted. Drive line anchor secured.   Continue daily wet to dry dressing changes using VASHE solution per bedside RN or VAD coordinator. Next dressing change due 07/23/23 by bedside nurse.       Labs:  LDH trend: 121>133>130>138>172>175>165>158>169>155>170  INR trend: 1.0>1.1>1.2>1.4>1.3>1.4>1.5>1.4>1.8>1.9  Anticoagulation Plan: -INR Goal: 2.0 - 2.5 -ASA Dose: none  ICD: N/A  Infection:  07/07/23>> blood cultures>> no growth 5 days; final  07/07/23>> wound cx>> few pseudomonas aeruginosa and MRSA; final  07/09/23>> OR driveline cx>> Few pseudomonas aeruginosa and METHICILLIN RESISTANT STAPHYLOCOCCUS AUREUS 07/09/23>> blood cultures>>no growth x 5 days; final  Drips:    Plan/Recommendations:  Page VAD coordinator with equipment issues or driveline problems Daily drive line wet to dry dressing changes with VASHE solution.   Alyce Pagan RN VAD Coordinator  Office: [phone]  24/7 Pager: [phone]

## 2023-07-22 NOTE — Progress Notes (Addendum)
Advanced Heart Failure VAD Team Note  PCP-Cardiologist: Nona Dell, MD  [facility]: Dr. Shirlee Latch   Subjective:    7/29: admitted w/ suspected DL fracture and DL infection. P/w "driveline communication fault" error message on LVAD. No pump stoppages. LVAD coordinator replaced modular cable and controller with resolution of driveline communication fault signal. CT C/A/P c/w prior imaging w/ similar appearance of mild circumferential thickening of the fat plane surrounding the drive line in the upper abdomen extending from the pump to the skin which may be chronic or represent an infectious process. No drainable fluid collection. Wound + BCx collected. Started on empiric daptomycin/Unasyn per ID.  7/30: Wound Cx growing Pseudomonas Aeruginosa (pan-sensitive) and S. aureus. Cefepime added 7/31: OR for wound debridement + application of wound vac.   8/8: Returned to OR for I&D 8/11: Losartan increased to 25 mg daily  Remains on Dapto, Cefepime and minocycline. Surgical wound with pseudomonas aeruginosa and MRSA. Bcx from 7/29 and 7/31 NG.  INR 1.9  Feels fine this morning. Slept around most of yesterday. Encouraged to get up and moving today.   LVAD INTERROGATION:  HeartMate III LVAD:   Flow 4.3 liters/min, speed 5600, power 4.5, PI 3. Unable to fully interrogate device as he's off monitor  Objective:    Vital Signs:   Temp:  [97.6 F (36.4 C)-98.3 F (36.8 C)] 97.6 F (36.4 C) (08/13 0604) Pulse Rate:  [79-92] 85 (08/13 0604) Resp:  [15-20] 15 (08/13 0604) BP: (98-134)/(74-103) 120/98 (08/13 0604) SpO2:  [97 %-99 %] 97 % (08/12 2329) Weight:  [93.2 kg] 93.2 kg (08/13 0604) Last BM Date : 07/21/23 Mean arterial Pressure 90s Intake/Output:   Intake/Output Summary (Last 24 hours) at 07/22/2023 0715 Last data filed at 07/22/2023 0237 Gross per 24 hour  Intake 486.06 ml  Output --  Net 486.06 ml     Physical Exam  General:  Well appearing. No resp difficulty HEENT: Normal Neck:  supple. JVP ~6. Carotids 2+ bilat; no bruits. No lymphadenopathy or thyromegaly appreciated. Cor: Mechanical heart sounds with LVAD hum present. Lungs: Clear Abdomen: soft, nontender, nondistended. No hepatosplenomegaly. No bruits or masses. Good bowel sounds. Large abd hernia Driveline: C/D/I; securement device intact and driveline incorporated Extremities: no cyanosis, clubbing, rash, edema Neuro: alert & orientedx3, cranial nerves grossly intact. moves all 4 extremities w/o difficulty. Affect pleasant  Telemetry   SR 90s (Personally reviewed)    Labs   Basic Metabolic Panel: Recent Labs  Lab 07/18/23 0621 07/19/23 0630 07/20/23 0625 07/21/23 0625 07/22/23 0524  NA 135 134* 132* 134* 134*  K 4.6 4.4 4.4 4.7 4.4  CL 98 99 101 100 101  CO2 26 24 23 27 22   GLUCOSE 232* 240* 271* 286* 271*  BUN 28* 24* 28* 28* 34*  CREATININE 1.06 1.10 1.09 1.25* 1.24  CALCIUM 9.3 8.7* 8.5* 9.4 9.1  MG  --  1.8 1.9 1.9 1.9    Liver Function Tests: No results for input(s): "AST", "ALT", "ALKPHOS", "BILITOT", "PROT", "ALBUMIN" in the last 168 hours.  CBC: Recent Labs  Lab 07/18/23 0621 07/19/23 0630 07/20/23 0625 07/21/23 0625 07/22/23 0524  WBC 8.0 7.3 7.1 7.4 7.6  HGB 11.2* 10.6* 10.2* 11.3* 10.4*  HCT 34.6* 33.3* 31.5* 35.7* 32.3*  MCV 85.6 86.3 88.2 86.2 85.7  PLT 189 171 162 175 163    INR: Recent Labs  Lab 07/18/23 0621 07/19/23 0630 07/20/23 0625 07/21/23 0625 07/22/23 0524  INR 1.4* 1.7* 1.8* 1.8* 1.9*    Other results:  Imaging   No results found.   Medications:     Scheduled Medications:  allopurinol  100 mg Oral Daily   amLODipine  10 mg Oral Daily   clonazepam  0.5 mg Oral BID   fluticasone furoate-vilanterol  1 puff Inhalation Daily   And   umeclidinium bromide  1 puff Inhalation Daily   gabapentin  300 mg Oral BID   insulin aspart  0-20 Units Subcutaneous TID WC   insulin aspart  0-5 Units Subcutaneous QHS   losartan  25 mg Oral Daily    metoprolol succinate  25 mg Oral Daily   minocycline  200 mg Oral BID   mirtazapine  15 mg Oral QHS   pantoprazole  40 mg Oral Daily   sertraline  50 mg Oral Daily   tamsulosin  0.4 mg Oral Daily   thiamine  100 mg Oral Daily   traZODone  150 mg Oral QHS   warfarin  6 mg Oral q1600   Warfarin - Pharmacist Dosing Inpatient   Does not apply q1600   zinc sulfate  220 mg Oral Daily    Infusions:  ceFEPime (MAXIPIME) IV 2 g (07/22/23 0610)   DAPTOmycin (CUBICIN) 800 mg in sodium chloride 0.9 % IVPB Stopped (07/21/23 1426)    PRN Medications: acetaminophen, fluticasone, HYDROcodone-acetaminophen   Patient Profile  62 y.o. 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 Now s/p Heartmate 3 LVAD. Now admitted w/ suspected DL fracture after multiple falls and recurrent DL infection.  Assessment/Plan:   1.  LVAD Driveline infection: MRSA driveline infection, admitted in 6/23 and again in 7/23-8/23 with multiple trips to the OR for debridement.  Cultures have grown acinetobacter and MRSA. He was readmitted with driveline infection in 2/24, completed course of daptomycin for MRSA. Now readmitted w/ suspected DL fracture and DL infection. P/w "driveline communication fault" error message on LVAD.LVAD coordinator replaced modular cable and controller with resolution of driveline communication fault signal. CT C/A/P c/w prior imaging w/ similar appearance of mild circumferential thickening of the fat plane surrounding the drive line in the upper abdomen extending from the pump to the skin which may be chronic or represent an infectious process. No drainable fluid collection.  - Started on empiric daptomycin/Unasyn per ID. Initial wound Cx + for Pseudomonas Aeruginosa (pan-sensitive) and S. aureus. Cefepime added.  Now on Dapto, Cefepime and minocycline. BCx NGTD. Taken to OR 7/31 for wound debridement + application of wound vac. Returned to OR 8/7 for wound wash out/debridement - continue abx per ID. Appreciate their assistance. ID recommends 4 weeks of daptomycin then Q 3 wks dalbavancin. Would have significant concern about him receiving IV abx at home with history of drug use and noncompliance. Would likely need to discharge to SNF if needs IV abx after discharge but he does not want to return to SNF. - site stable. Continue IV abx - will work with VAD team to see if we can do home IV abx with support from his sisters  2. Suspected DL fracture with multiple recent falls - Has had multiple falls during the last 2 months, one direct trauma to mid sternum (fell on cinder block) - CT chest/abd/pelvis per above  - CXR w/ LVAD partially imaged. The associated lead noted to be intact w/ no fracture or kink  -  LVAD coordinators changed modular cable and controller with resolution  - No alarms   3.  Chronic systolic CHF s/p LVAD: Echo 10/21 with EF 20-25%, mildly decreased RV function. LHC/RHC in 12/21 with patent grafts, low output. Suspect mixed ischemic/nonischemic cardiomyopathy (prior heavy ETOH and drugs as well as CAD). Admitted with cardiogenic shock in 12/21, had placement of Impella 5.5 initially, now s/p Heartmate 3 LVAD on 11/17/20.  Ramp echo 2/23 with speed decreased to 5500 rpm. Speed now back to 5600 rpm.   - Volume status stable.       - Continue current Toprol XL 25 mg daily.  - Continue losartan 25 mg daily.  Consider increasing losartan.  - INR 1.1 on admission. Suspect noncompliant with Warfarin. - INR 1.9 today. Discussed dosing with PharmD personally. -VAD interrogated personally. Parameters stable.  4. CKD stage 3: Prior CTA abdomen/pelvis with high-grade stenosis of proximal right renal artery.  - Would consider eventual reassessment of renal artery stenosis and possible treatment   -BMET pending.   5.  CAD: S/p CABG 12/20.  LHC pre-VAD with patent grafts, no target for intervention. - Continue Crestor 20 mg daily.  - No s/s angina  6.  Atrial fibrillation: Paroxysmal.  S/p Maze and LA appendage clip with CABG in 12/20.  - Maintaining SR.  - Continue Toprol XL 25 mg daily.  - Off amiodarone with hyperthyroidism.  7. Type 2 diabetes: improved control, Hgb A1c down from 12.1>>6.3  - continue SSI   8. 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 He says last use of methamphetamine was > 2 months ago. - UDS this admit negative (however collected > 48 hrs post admit)  - Declines substance use resources  9. 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  Followed by endocrinology. He is now off amiodarone. He had been on methimazole but has not been taking recently. TSH was high on admit and free T4 normal  - Will NOT restart methimazole   10. Anemia (chronic blood loss):  Hgb stable.   11. COPD: Prior smoker.  - Continue Trelegy.   12. PVCs/NSVT:  - Now off amiodarone.   13. Depression: Has turned to substance abuse to cope.   - Has been referred to OP psych in [place]   14.  Gout: Continue allopurinol, he has prn colchicine.   15. HTN: MAPs 90 in the setting of known Rt RAS.  - monitor closely, if remains elevated may need reassessment of renal artery stenosis and possible treatment. Significant calcifications in both renal arteries on CT abdomen this admit. - Continue Amlodipine 10 mg + losartan 25 mg daily.  16. Agitation: - Klonopin increased, agitation improved  17. Incisional hernia: -Continue abdominal binder - Asking if he can get hernia repaired.    I reviewed the LVAD parameters from today, and compared the results to the patient's prior recorded data.  No programming changes were made.  The LVAD is functioning within specified parameters.  The patient performs LVAD self-test daily.  LVAD interrogation was negative for  any significant power changes, alarms or PI events/speed drops.  LVAD equipment check completed and is in good working order.  Back-up equipment present.   LVAD education done on emergency procedures and precautions and reviewed exit site care.  Length of Stay: 15  Kevin Bleacher, NP 07/22/2023, 7:15 AM  VAD Team --- VAD ISSUES ONLY--- Pager [phone] (7am - 7am)  Advanced Heart Failure Team  Pager [phone] (M-F; 7a - 5p)  Please  contact [facility] for night-coverage after hours (5p -7a ) and weekends on [email or web address]  Patient seen and examined with the above-signed Advanced Practice Provider and/or Housestaff. I personally reviewed laboratory data, imaging studies and relevant notes. I independently examined the patient and formulated the important aspects of the plan. I have edited the note to reflect any of my changes or salient points. I have personally discussed the plan with the patient and/or family.  Feels better today. Still sore at herna site but otherwise ok.   Remains on IV abx. No f/c. INR 1.9  General:  NAD.  HEENT: normal  Neck: supple. JVP not elevated.  Carotids 2+ bilat; no bruits. No lymphadenopathy or thryomegaly appreciated. Cor: LVAD hum.  Lungs: Clear. Abdomen: obese soft, nontender, non-distended. No hepatosplenomegaly. No bruits or masses. Good bowel sounds. Large incisional hernia. Wound dressing ok  Extremities: no cyanosis, clubbing, rash. Warm no edema  Neuro: alert & oriented x 3. No focal deficits. Moves all 4 without problem   Remains on IV abx for DL wound. Needs to stay in hospital for IV abx given drug use history.   VAD interrogated personally. Parameters stable.  INR 1.9. Discussed dosing with PharmD personally.  Arvilla Meres, MD  10:32 AM

## 2023-07-22 NOTE — Progress Notes (Signed)
Pharmacy Antibiotic Note  Kevin Underwood. is a 62 y.o. male admitted on 07/07/2023 with  driveline infection . Pharmacy has been consulted for daptomycin and cefepime dosing. Pt also continued on PTA minocycline for hx acinetobacter DLI.  Scr stable at 1.2 (CrCl 70 mL/min). WBC now WNL at 7.6, afebrile.Wound cultures growing Pseudomonas and MRSA, blood cultures negative. CK stable at 56.  Plan: Daptomycin 800 mg every 24 hours Cefepime 2g IV every 8 hours Minocycline 200 mg BID F/u blood and driveline cx and continue to monitor renal function Monitor CK weekly  Height: 5\' 10"  (177.8 cm) Weight: 93.2 kg (205 lb 7.5 oz) IBW/kg (Calculated) : 73  Temp (24hrs), Avg:97.9 F (36.6 C), Min:97.6 F (36.4 C), Max:98.3 F (36.8 C)  Recent Labs  Lab 07/18/23 0621 07/19/23 0630 07/20/23 0625 07/21/23 0625 07/22/23 0524  WBC 8.0 7.3 7.1 7.4 7.6  CREATININE 1.06 1.10 1.09 1.25* 1.24    Estimated Creatinine Clearance: 70.9 mL/min (by C-G formula based on SCr of 1.24 mg/dL).    Allergies  Allergen Reactions   Vancomycin Nausea And Vomiting and Other (See Comments)    Severe joint pain    Antimicrobials this admission: 7/29 Daptomycin >>  7/29 Unasyn >> 7/31 7/31 Minocycline >>  7/31 Cefepime >>   Microbiology results: 7/29 BCx: negative 7/29 Driveline Wound Cx: pseudomonas/MRSA  7/31 Driveline abscess cx: pseudomonas (pan-sensitive)/MRSA  Thank you for allowing pharmacy to participate in this patient's care,  Sherron Monday, PharmD, BCCCP Clinical Pharmacist  Phone: [phone] 07/22/2023 9:56 AM  Please check AMION for all [facility] Pharmacy phone numbers After 10:00 PM, call Main Pharmacy [phone]

## 2023-07-22 NOTE — Plan of Care (Signed)
  Problem: Education: Goal: Patient will understand all VAD equipment and how it functions Outcome: Progressing Goal: Patient will be able to verbalize current INR target range and antiplatelet therapy for discharge home Outcome: Progressing   Problem: Cardiac: Goal: LVAD will function as expected and patient will experience no clinical alarms Outcome: Progressing   Problem: Education: Goal: Knowledge of General Education information will improve Description: Including pain rating scale, medication(s)/side effects and non-pharmacologic comfort measures Outcome: Progressing   Problem: Health Behavior/Discharge Planning: Goal: Ability to manage health-related needs will improve Outcome: Progressing   Problem: Clinical Measurements: Goal: Ability to maintain clinical measurements within normal limits will improve Outcome: Progressing Goal: Will remain free from infection Outcome: Progressing Goal: Diagnostic test results will improve Outcome: Progressing Goal: Respiratory complications will improve Outcome: Progressing Goal: Cardiovascular complication will be avoided Outcome: Progressing   Problem: Activity: Goal: Risk for activity intolerance will decrease Outcome: Progressing   Problem: Nutrition: Goal: Adequate nutrition will be maintained Outcome: Progressing   Problem: Coping: Goal: Level of anxiety will decrease Outcome: Progressing   Problem: Elimination: Goal: Will not experience complications related to bowel motility Outcome: Progressing Goal: Will not experience complications related to urinary retention Outcome: Progressing   Problem: Pain Managment: Goal: General experience of comfort will improve Outcome: Progressing   Problem: Safety: Goal: Ability to remain free from injury will improve Outcome: Progressing   Problem: Skin Integrity: Goal: Risk for impaired skin integrity will decrease Outcome: Progressing   Problem: Education: Goal: Ability  to describe self-care measures that may prevent or decrease complications (Diabetes Survival Skills Education) will improve Outcome: Progressing Goal: Individualized Educational Video(s) Outcome: Progressing   Problem: Coping: Goal: Ability to adjust to condition or change in health will improve Outcome: Progressing   Problem: Fluid Volume: Goal: Ability to maintain a balanced intake and output will improve Outcome: Progressing   Problem: Health Behavior/Discharge Planning: Goal: Ability to identify and utilize available resources and services will improve Outcome: Progressing Goal: Ability to manage health-related needs will improve Outcome: Progressing   Problem: Metabolic: Goal: Ability to maintain appropriate glucose levels will improve Outcome: Progressing   Problem: Nutritional: Goal: Maintenance of adequate nutrition will improve Outcome: Progressing Goal: Progress toward achieving an optimal weight will improve Outcome: Progressing   Problem: Skin Integrity: Goal: Risk for impaired skin integrity will decrease Outcome: Progressing   Problem: Tissue Perfusion: Goal: Adequacy of tissue perfusion will improve Outcome: Progressing   

## 2023-07-22 NOTE — Progress Notes (Signed)
ANTICOAGULATION CONSULT NOTE  Pharmacy Consult for warfarin  Indication:  LVAD  Allergies  Allergen Reactions   Vancomycin Nausea And Vomiting and Other (See Comments)    Severe joint pain    Patient Measurements: Height: 5\' 10"  (177.8 cm) Weight: 93.2 kg (205 lb 7.5 oz) IBW/kg (Calculated) : 73 Heparin Dosing Weight: 91.4 kg   Vital Signs: Temp: 97.7 F (36.5 C) (08/13 0737) Temp Source: Oral (08/13 0737) BP: 113/91 (08/13 0737) Pulse Rate: 91 (08/13 0737)  Labs: Recent Labs    07/20/23 0625 07/21/23 0625 07/22/23 0524  HGB 10.2* 11.3* 10.4*  HCT 31.5* 35.7* 32.3*  PLT 162 175 163  LABPROT 21.3* 21.4* 22.0*  INR 1.8* 1.8* 1.9*  CREATININE 1.09 1.25* 1.24  CKTOTAL  --   --  56    Estimated Creatinine Clearance: 70.9 mL/min (by C-G formula based on SCr of 1.24 mg/dL).   Medical History: Past Medical History:  Diagnosis Date   "    Arthritis    CAD (coronary artery disease)    a. s/p CABG in 11/2019 with LIMA-LAD, SVG-OM1, SVG-PDA and SVG-D1   CHF (congestive heart failure) (HCC)    a. EF < 20% by echo in 11/2019   Essential hypertension    PAF (paroxysmal atrial fibrillation) (HCC)    Type 2 diabetes mellitus (HCC)     Medications:  Scheduled:   allopurinol  100 mg Oral Daily   amLODipine  10 mg Oral Daily   clonazepam  0.5 mg Oral BID   fluticasone furoate-vilanterol  1 puff Inhalation Daily   And   umeclidinium bromide  1 puff Inhalation Daily   gabapentin  300 mg Oral BID   insulin aspart  0-20 Units Subcutaneous TID WC   insulin aspart  0-5 Units Subcutaneous QHS   losartan  25 mg Oral Daily   metoprolol succinate  25 mg Oral Daily   minocycline  200 mg Oral BID   mirtazapine  15 mg Oral QHS   pantoprazole  40 mg Oral Daily   sertraline  50 mg Oral Daily   tamsulosin  0.4 mg Oral Daily   thiamine  100 mg Oral Daily   traZODone  150 mg Oral QHS   warfarin  6 mg Oral q1600   Warfarin - Pharmacist Dosing Inpatient   Does not apply q1600    zinc sulfate  220 mg Oral Daily    Assessment: 62 yom who presented to clinic with concern for driveline fracture and infection. [facility] appointment was on 04/21/2023 - INR was subtherapeutic at 1.4 and regimen at that time was 6 mg daily except 4 mg Tues/Thurs (LD 7/25).  Given low INR on admit, started fixed rate heparin 500 units/hr - now stopped. INR is therapeutic at 1.9 - Hgb 10.4, plt 170. LDH 170. No s/sx of bleeding. No further debridements.  Goal of Therapy:  INR 2-2.5 Monitor platelets by anticoagulation protocol: Yes   Plan:  Warfarin 4 mg tonight - trying to mirror previous PTA regimen  Monitor daily heparin level, INR, CBC, LDH, and for s/sx of bleeding   Thank you for allowing pharmacy to participate in this patient's care,  Sherron Monday, PharmD, BCCCP Clinical Pharmacist  Phone: [phone] 07/22/2023 9:57 AM  Please check AMION for all [facility] Pharmacy phone numbers After 10:00 PM, call Main Pharmacy [phone]

## 2023-07-22 NOTE — Inpatient Diabetes Management (Addendum)
Inpatient Diabetes Program Recommendations  AACE/ADA: New Consensus Statement on Inpatient Glycemic Control (2015)  Target Ranges:  Prepandial:   less than 140 mg/dL      Peak postprandial:   less than 180 mg/dL (1-2 hours)      Critically ill patients:  140 - 180 mg/dL   Lab Results  Component Value Date   GLUCAP 262 (H) 07/22/2023   HGBA1C 6.3 (H) 07/09/2023    Latest Reference Range & Units 07/21/23 06:12 07/21/23 11:39 07/21/23 15:19 07/21/23 21:13 07/22/23 06:06  Glucose-Capillary 70 - 99 mg/dL 782 (H) Novolog 8 units 203 (H) Novolog 5 units 215 (H) Novolog 7 units 250 (H) Novolog 2 units 262 (H) Novolog 11 units  (H): Data is abnormally high  Diabetes history: DM2 Outpatient Diabetes medications: Glucotrol 5 mg QD Current orders for Inpatient glycemic control: Novolog 0-15 units TID and 0-5 units QHS  Inpatient Diabetes Program Recommendations:   Received total Novolog 25 units correction over the past 24 hrs. Please consider: -Semglee 14 units every day (0.15 units/kg x 93.2 kg = 14 units)  1 pm: Educated patient on insulin pen use at home. Reviewed contents of insulin flexpen starter kit. Reviewed all steps if insulin pen including attachment of needle, 2-unit air shot, dialing up dose, giving injection, removing needle, disposal of sharps, storage of unused insulin, disposal of insulin etc. Patient able to provide successful return demonstration. Also reviewed troubleshooting with insulin pen. MD to give patient Rxs for insulin pens and insulin pen needles.   Nurses, please continue insulin injection teaching and allow patient to give own injections to help prepare for discharge home.  Thank you, Billy Fischer. , RN, MSN, CDE  Diabetes Coordinator Inpatient Glycemic Control Team Team Pager [phone] (8am-5pm) 07/22/2023 10:08 AM

## 2023-07-23 DIAGNOSIS — T827XXA Infection and inflammatory reaction due to other cardiac and vascular devices, implants and grafts, initial encounter: Secondary | ICD-10-CM | POA: Diagnosis not present

## 2023-07-23 LAB — CBC
HCT: 36.1 % — ABNORMAL LOW (ref 39.0–52.0)
Hemoglobin: 11.8 g/dL — ABNORMAL LOW (ref 13.0–17.0)
MCH: 28.4 pg (ref 26.0–34.0)
MCHC: 32.7 g/dL (ref 30.0–36.0)
MCV: 87 fL (ref 80.0–100.0)
Platelets: 207 10*3/uL (ref 150–400)
RBC: 4.15 MIL/uL — ABNORMAL LOW (ref 4.22–5.81)
RDW: 14.7 % (ref 11.5–15.5)
WBC: 10.8 10*3/uL — ABNORMAL HIGH (ref 4.0–10.5)
nRBC: 0 % (ref 0.0–0.2)

## 2023-07-23 LAB — GLUCOSE, CAPILLARY
Glucose-Capillary: 262 mg/dL — ABNORMAL HIGH (ref 70–99)
Glucose-Capillary: 172 mg/dL — ABNORMAL HIGH (ref 70–99)
Glucose-Capillary: 216 mg/dL — ABNORMAL HIGH (ref 70–99)
Glucose-Capillary: 166 mg/dL — ABNORMAL HIGH (ref 70–99)

## 2023-07-23 MED ORDER — WARFARIN SODIUM 3 MG PO TABS
6.0000 mg | ORAL_TABLET | Freq: Every day | ORAL | Status: DC
  Administered 2023-07-23 – 2023-07-31 (×9): 6 mg via ORAL
  Filled 2023-07-23 (×9): qty 2

## 2023-07-23 NOTE — Progress Notes (Signed)
ANTICOAGULATION CONSULT NOTE  Pharmacy Consult for warfarin  Indication:  LVAD  Allergies  Allergen Reactions   Vancomycin Nausea And Vomiting and Other (See Comments)    Severe joint pain    Patient Measurements: Height: 5\' 10"  (177.8 cm) Weight: 93.7 kg (206 lb 9.1 oz) IBW/kg (Calculated) : 73 Heparin Dosing Weight: 91.4 kg   Vital Signs: Temp: 97.8 F (36.6 C) (08/14 1142) Temp Source: Oral (08/14 1142) BP: 129/97 (08/14 1142) Pulse Rate: 89 (08/14 1142)  Labs: Recent Labs    07/21/23 0625 07/22/23 0524 07/23/23 0731 07/23/23 1154  HGB 11.3* 10.4*  --  11.8*  HCT 35.7* 32.3*  --  36.1*  PLT 175 163  --  207  LABPROT 21.4* 22.0* 20.0*  --   INR 1.8* 1.9* 1.7*  --   CREATININE 1.25* 1.24 1.10  --   CKTOTAL  --  56  --   --     Estimated Creatinine Clearance: 80.1 mL/min (by C-G formula based on SCr of 1.1 mg/dL).   Medical History: Past Medical History:  Diagnosis Date   "    Arthritis    CAD (coronary artery disease)    a. s/p CABG in 11/2019 with LIMA-LAD, SVG-OM1, SVG-PDA and SVG-D1   CHF (congestive heart failure) (HCC)    a. EF < 20% by echo in 11/2019   Essential hypertension    PAF (paroxysmal atrial fibrillation) (HCC)    Type 2 diabetes mellitus (HCC)     Medications:  Scheduled:   allopurinol  100 mg Oral Daily   amLODipine  10 mg Oral Daily   clonazepam  0.5 mg Oral BID   fluticasone furoate-vilanterol  1 puff Inhalation Daily   And   umeclidinium bromide  1 puff Inhalation Daily   gabapentin  300 mg Oral BID   insulin aspart  0-20 Units Subcutaneous TID WC   insulin aspart  0-5 Units Subcutaneous QHS   insulin glargine-yfgn  14 Units Subcutaneous Daily   losartan  25 mg Oral Daily   metoprolol succinate  25 mg Oral Daily   minocycline  200 mg Oral BID   mirtazapine  15 mg Oral QHS   pantoprazole  40 mg Oral Daily   sertraline  50 mg Oral Daily   tamsulosin  0.4 mg Oral Daily   thiamine  100 mg Oral Daily   traZODone  150 mg  Oral QHS   warfarin  6 mg Oral q1600   Warfarin - Pharmacist Dosing Inpatient   Does not apply q1600   zinc sulfate  220 mg Oral Daily    Assessment: 62 yom who presented to clinic with concern for driveline fracture and infection. Last [facility] appointment was on 04/21/2023 - INR was subtherapeutic at 1.4 and regimen at that time was 6 mg daily except 4 mg Tues/Thurs (LD 7/25).  Given low INR on admit, started fixed rate heparin 500 units/hr - now stopped. INR is slightly sub-therapeutic at 1.7  HGB stable 10-11, pltc stable 200s  LDH stable 100-200s No s/sx of bleeding. No further debridements.  Goal of Therapy:  INR 2-2.5 Monitor platelets by anticoagulation protocol: Yes   Plan:  Warfarin 6mg  daily Monitor daily heparin level, INR, CBC, LDH, and for s/sx of bleeding    Leota Sauers Pharm.D. CPP, BCPS Clinical Pharmacist [phone] 07/23/2023 2:33 PM    Please check AMION for all [facility] Pharmacy phone numbers After 10:00 PM, call Main Pharmacy [phone]

## 2023-07-23 NOTE — Plan of Care (Signed)
  Problem: Education: Goal: Patient will understand all VAD equipment and how it functions Outcome: Progressing Goal: Patient will be able to verbalize current INR target range and antiplatelet therapy for discharge home Outcome: Progressing   Problem: Cardiac: Goal: LVAD will function as expected and patient will experience no clinical alarms Outcome: Progressing   Problem: Education: Goal: Knowledge of General Education information will improve Description: Including pain rating scale, medication(s)/side effects and non-pharmacologic comfort measures Outcome: Progressing   Problem: Health Behavior/Discharge Planning: Goal: Ability to manage health-related needs will improve Outcome: Progressing   Problem: Clinical Measurements: Goal: Ability to maintain clinical measurements within normal limits will improve Outcome: Progressing Goal: Will remain free from infection Outcome: Progressing Goal: Diagnostic test results will improve Outcome: Progressing Goal: Respiratory complications will improve Outcome: Progressing Goal: Cardiovascular complication will be avoided Outcome: Progressing   Problem: Activity: Goal: Risk for activity intolerance will decrease Outcome: Progressing   Problem: Nutrition: Goal: Adequate nutrition will be maintained Outcome: Progressing   Problem: Coping: Goal: Level of anxiety will decrease Outcome: Progressing   Problem: Elimination: Goal: Will not experience complications related to bowel motility Outcome: Progressing Goal: Will not experience complications related to urinary retention Outcome: Progressing   Problem: Pain Managment: Goal: General experience of comfort will improve Outcome: Progressing   Problem: Safety: Goal: Ability to remain free from injury will improve Outcome: Progressing   Problem: Skin Integrity: Goal: Risk for impaired skin integrity will decrease Outcome: Progressing   Problem: Education: Goal: Ability  to describe self-care measures that may prevent or decrease complications (Diabetes Survival Skills Education) will improve Outcome: Progressing Goal: Individualized Educational Video(s) Outcome: Progressing   Problem: Coping: Goal: Ability to adjust to condition or change in health will improve Outcome: Progressing   Problem: Fluid Volume: Goal: Ability to maintain a balanced intake and output will improve Outcome: Progressing   Problem: Health Behavior/Discharge Planning: Goal: Ability to identify and utilize available resources and services will improve Outcome: Progressing Goal: Ability to manage health-related needs will improve Outcome: Progressing   Problem: Metabolic: Goal: Ability to maintain appropriate glucose levels will improve Outcome: Progressing   Problem: Nutritional: Goal: Maintenance of adequate nutrition will improve Outcome: Progressing Goal: Progress toward achieving an optimal weight will improve Outcome: Progressing

## 2023-07-23 NOTE — Progress Notes (Addendum)
Advanced Heart Failure VAD Team Note  PCP-Cardiologist: Nona Dell, MD  [facility]: Dr. Shirlee Latch   Subjective:    7/29: admitted w/ suspected DL fracture and DL infection. P/w "driveline communication fault" error message on LVAD. No pump stoppages. LVAD coordinator replaced modular cable and controller with resolution of driveline communication fault signal. CT C/A/P c/w prior imaging w/ similar appearance of mild circumferential thickening of the fat plane surrounding the drive line in the upper abdomen extending from the pump to the skin which may be chronic or represent an infectious process. No drainable fluid collection. Wound + BCx collected. Started on empiric daptomycin/Unasyn per ID.  7/30: Wound Cx growing Pseudomonas Aeruginosa (pan-sensitive) and S. aureus. Cefepime added 7/31: OR for wound debridement + application of wound vac.   8/8: Returned to OR for I&D 8/11: Losartan increased to 25 mg daily  Remains on Dapto, Cefepime and minocycline. Surgical wound with pseudomonas aeruginosa and MRSA. Bcx from 7/29 and 7/31 NG.  Feels ok today. No f/c. Sitting up in bed. Denies dyspnea. Only complaint is mild soreness around ventral hernia. Wearing abdominal binder.   Labs pending   LVAD INTERROGATION:  HeartMate III LVAD:   Flow 4.3 liters/min, speed 5600, power 4.2, PI 3.1. Numerous PI events   Objective:    Vital Signs:   Temp:  [97.6 F (36.4 C)-97.8 F (36.6 C)] 97.6 F (36.4 C) (08/14 0634) Pulse Rate:  [73-83] 83 (08/14 0634) Resp:  [15-19] 17 (08/14 0634) BP: (95-124)/(74-98) 124/98 (08/14 0634) SpO2:  [96 %-98 %] 96 % (08/14 0634) Weight:  [93.7 kg] 93.7 kg (08/14 0634) Last BM Date : 07/21/23 Mean arterial Pressure low 100s  Intake/Output:   Intake/Output Summary (Last 24 hours) at 07/23/2023 0742 Last data filed at 07/22/2023 2320 Gross per 24 hour  Intake 846 ml  Output --  Net 846 ml     Physical Exam   General:  Well appearing. No respiratory  difficulty HEENT: normal Neck: supple. no JVD. Carotids 2+ bilat; no bruits. No lymphadenopathy or thyromegaly appreciated. Cor: PMI nondisplaced. Regular rate & rhythm. No rubs, gallops or murmurs. Lungs: clear Abdomen: wearing abdomina binder. No tenderness  Extremities: no cyanosis, clubbing, rash, edema Neuro: alert & oriented x 3, cranial nerves grossly intact. moves all 4 extremities w/o difficulty. Affect pleasant.    Telemetry   NSR 90s (Personally reviewed)    Labs   Basic Metabolic Panel: Recent Labs  Lab 07/18/23 0621 07/19/23 0630 07/20/23 0625 07/21/23 0625 07/22/23 0524  NA 135 134* 132* 134* 134*  K 4.6 4.4 4.4 4.7 4.4  CL 98 99 101 100 101  CO2 26 24 23 27 22   GLUCOSE 232* 240* 271* 286* 271*  BUN 28* 24* 28* 28* 34*  CREATININE 1.06 1.10 1.09 1.25* 1.24  CALCIUM 9.3 8.7* 8.5* 9.4 9.1  MG  --  1.8 1.9 1.9 1.9    Liver Function Tests: No results for input(s): "AST", "ALT", "ALKPHOS", "BILITOT", "PROT", "ALBUMIN" in the last 168 hours.  CBC: Recent Labs  Lab 07/18/23 0621 07/19/23 0630 07/20/23 0625 07/21/23 0625 07/22/23 0524  WBC 8.0 7.3 7.1 7.4 7.6  HGB 11.2* 10.6* 10.2* 11.3* 10.4*  HCT 34.6* 33.3* 31.5* 35.7* 32.3*  MCV 85.6 86.3 88.2 86.2 85.7  PLT 189 171 162 175 163    INR: Recent Labs  Lab 07/18/23 0621 07/19/23 0630 07/20/23 0625 07/21/23 0625 07/22/23 0524  INR 1.4* 1.7* 1.8* 1.8* 1.9*    Other results:    Imaging  No results found.   Medications:     Scheduled Medications:  allopurinol  100 mg Oral Daily   amLODipine  10 mg Oral Daily   clonazepam  0.5 mg Oral BID   fluticasone furoate-vilanterol  1 puff Inhalation Daily   And   umeclidinium bromide  1 puff Inhalation Daily   gabapentin  300 mg Oral BID   insulin aspart  0-20 Units Subcutaneous TID WC   insulin aspart  0-5 Units Subcutaneous QHS   insulin glargine-yfgn  14 Units Subcutaneous Daily   losartan  25 mg Oral Daily   metoprolol succinate  25  mg Oral Daily   minocycline  200 mg Oral BID   mirtazapine  15 mg Oral QHS   pantoprazole  40 mg Oral Daily   sertraline  50 mg Oral Daily   tamsulosin  0.4 mg Oral Daily   thiamine  100 mg Oral Daily   traZODone  150 mg Oral QHS   Warfarin - Pharmacist Dosing Inpatient   Does not apply q1600   zinc sulfate  220 mg Oral Daily    Infusions:  ceFEPime (MAXIPIME) IV 2 g (07/23/23 7829)   DAPTOmycin (CUBICIN) 800 mg in sodium chloride 0.9 % IVPB Stopped (07/22/23 1410)    PRN Medications: acetaminophen, fluticasone, HYDROcodone-acetaminophen   Patient Profile  62 y.o. 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 Now s/p Heartmate 3 LVAD. Now admitted w/ suspected DL fracture after multiple falls and recurrent DL infection.  Assessment/Plan:   1.  LVAD Driveline infection: MRSA driveline infection, admitted in 6/23 and again in 7/23-8/23 with multiple trips to the OR for debridement.  Cultures have grown acinetobacter and MRSA. He was readmitted with driveline infection in 2/24, completed course of daptomycin for MRSA. Now readmitted w/ suspected DL fracture and DL infection. P/w "driveline communication fault" error message on LVAD.LVAD coordinator replaced modular cable and controller with resolution of driveline communication fault signal. CT C/A/P c/w prior imaging w/ similar appearance of mild circumferential thickening of the fat plane surrounding the drive line in the upper abdomen extending from the pump to the skin which may be chronic or represent an infectious process. No drainable fluid collection.  - Started on empiric daptomycin/Unasyn per ID. Initial wound Cx + for Pseudomonas Aeruginosa (pan-sensitive) and S. aureus. Cefepime added. Now on Dapto, Cefepime and minocycline. BCx NGTD.  Taken to OR 7/31 for wound debridement + application of wound vac. Returned to OR 8/7 for wound wash out/debridement - continue abx per ID. Appreciate their assistance. ID recommends 4 weeks of daptomycin then Q 3 wks dalbavancin. Would have significant concern about him receiving IV abx at home with history of drug use and noncompliance. Would likely need to discharge to SNF if needs IV abx after discharge but he does not want to return to SNF. Plan to stay in hospital until completion of therapy  - site stable. Continue IV abx   2. Suspected DL fracture with multiple recent falls - Has had multiple falls during the last 2 months, one direct trauma to mid sternum (fell on cinder block) - CT chest/abd/pelvis per above  - CXR w/ LVAD partially imaged. The associated lead noted to be intact w/ no fracture or kink  - LVAD coordinators changed modular cable and controller with resolution  -  No alarms   3.  Chronic systolic CHF s/p LVAD: Echo 10/21 with EF 20-25%, mildly decreased RV function. LHC/RHC in 12/21 with patent grafts, low output. Suspect mixed ischemic/nonischemic cardiomyopathy (prior heavy ETOH and drugs as well as CAD). Admitted with cardiogenic shock in 12/21, had placement of Impella 5.5 initially, now s/p Heartmate 3 LVAD on 11/17/20.  Ramp echo 2/23 with speed decreased to 5500 rpm. Speed now back to 5600 rpm.   - Volume status stable.       - Continue current Toprol XL 25 mg daily.  - Continue losartan 25 mg daily.  - INR 1.1 on admission. Suspect noncompliant with Warfarin. - INR today pending. Dosing per PharmD  - VAD interrogated personally. Parameters stable.  4. CKD stage 3: Prior CTA abdomen/pelvis with high-grade stenosis of proximal right renal artery.  - Would consider eventual reassessment of renal artery stenosis and possible treatment  -BMET pending.   5.  CAD: S/p CABG 12/20.  LHC pre-VAD with patent grafts, no target for intervention. - Continue Crestor 20 mg  daily.  - No s/s angina  6.  Atrial fibrillation: Paroxysmal.  S/p Maze and LA appendage clip with CABG in 12/20.  - Maintaining SR.  - Continue Toprol XL 25 mg daily.  - Off amiodarone with hyperthyroidism.  7. Type 2 diabetes: improved control, Hgb A1c down from 12.1>>6.3  - continue SSI   8. 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 He says last use of methamphetamine was > 2 months ago. - UDS this admit negative (however collected > 48 hrs post admit)  - Declines substance use resources  9. 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  Followed by endocrinology. He is now off amiodarone. He had been on methimazole but has not been taking recently. TSH was high on admit and free T4 normal  - Will NOT restart methimazole   10. Anemia (chronic blood loss):  Hgb stable.   11. COPD: Prior smoker.  - Continue Trelegy.   12. PVCs/NSVT:  - Now off amiodarone.   13. Depression: Has turned to substance abuse to cope.   - Has been referred to OP psych in [place]   14.  Gout: Continue allopurinol, he has prn colchicine.   15. HTN: MAPs low 100s in the setting of known Rt RAS.  - monitor closely, if remains elevated may need reassessment of renal artery stenosis and possible treatment. Significant calcifications in both renal arteries on CT abdomen this admit. - Continue Amlodipine 10 mg + losartan 25 mg daily. May need increase to 50. Awaiting BMP   16. Agitation: - Klonopin increased, agitation improved  17. Incisional hernia: -Continue abdominal binder - Asking if he can get hernia repaired.    I reviewed the LVAD parameters from today, and compared the results to the patient's prior recorded data.  No programming changes were made.  The LVAD is functioning within specified parameters.  The patient performs LVAD self-test daily.  LVAD interrogation was negative for any significant power changes, alarms or PI events/speed drops.  LVAD equipment check  completed and is in good working order.  Back-up equipment present.   LVAD education done on emergency procedures and precautions and reviewed exit site care.  Length of Stay: [address], PA-C 07/23/2023, 7:42 AM  VAD Team --- VAD ISSUES ONLY--- Pager [phone] (7am - 7am)  Advanced Heart Failure Team  Pager [phone] (M-F; 7a - 5p)  Please contact [facility] for night-coverage after hours (  5p -7a ) and weekends on [email or web address]  Patient seen and examined with the above-signed Advanced Practice Provider and/or Housestaff. I personally reviewed laboratory data, imaging studies and relevant notes. I independently examined the patient and formulated the important aspects of the plan. I have edited the note to reflect any of my changes or salient points. I have personally discussed the plan with the patient and/or family.  Says his hernia is bugging him. Remains on IV abx. No f/c.   General:  NAD.  HEENT: normal  Neck: supple. JVP not elevated.  Carotids 2+ bilat; no bruits. No lymphadenopathy or thryomegaly appreciated. Cor: LVAD hum.  Lungs: Clear. Abdomen:  soft, nontender, very large incisional hernia No hepatosplenomegaly. No bruits or masses. Good bowel sounds. Dressing clean. Anchor in place.  Extremities: no cyanosis, clubbing, rash. Warm no edema  Neuro: alert & oriented x 3. No focal deficits. Moves all 4 without problem   Doing well with IV abx. INR 1.7. Discussed dosing with PharmD personally.  VAD interrogated personally. Parameters stable.  Remain in house until IV abx finished due to h/o substance abuse.   Arvilla Meres, MD  1:09 PM

## 2023-07-23 NOTE — Plan of Care (Signed)
  Problem: Activity: Goal: Risk for activity intolerance will decrease Outcome: Progressing   Problem: Nutrition: Goal: Adequate nutrition will be maintained Outcome: Progressing   Problem: Coping: Goal: Level of anxiety will decrease Outcome: Progressing   Problem: Pain Managment: Goal: General experience of comfort will improve Outcome: Progressing   Problem: Safety: Goal: Ability to remain free from injury will improve Outcome: Progressing   

## 2023-07-23 NOTE — Progress Notes (Signed)
6 Days Post-Op Procedure(s) (LRB): DRIVELINE WOUND DEBRIDEMENT (N/A) Subjective: No complaints sitting on side of bed having lunch. Receiving IV antibiotics for his dual microbial VAD tunnel infection (MRSA and Pseudomonas). Receiving daily wet-to-dry dressing changes of the VAD tunnel using Vashe solution.  Objective: Vital signs in last 24 hours: Temp:  [97.6 F (36.4 C)-97.8 F (36.6 C)] 97.8 F (36.6 C) (08/14 1142) Pulse Rate:  [76-102] 89 (08/14 1142) Cardiac Rhythm: Normal sinus rhythm (08/14 0553) Resp:  [14-20] 14 (08/14 1142) BP: (100-129)/(83-98) 129/97 (08/14 1142) SpO2:  [95 %-98 %] 95 % (08/14 0815) Weight:  [93.7 kg] 93.7 kg (08/14 0634)  Hemodynamic parameters for last 24 hours:  Sinus rhythm stable VAD flows  Intake/Output from previous day: 08/13 0701 - 08/14 0700 In: 966 [P.O.:600; IV Piggyback:366] Out: -  Intake/Output this shift: No intake/output data recorded.       Exam    General- alert and comfortable    Neck- no JVD, no cervical adenopathy palpable, no carotid bruit   Lungs- clear without rales, wheezes   Cor- regular rate and rhythm, normal VAD hum   Abdomen- soft, non-tender, binder in place   Extremities - warm, non-tender, minimal edema   Neuro- oriented, appropriate, no focal weakness   Lab Results: Recent Labs    07/22/23 0524 07/23/23 1154  WBC 7.6 10.8*  HGB 10.4* 11.8*  HCT 32.3* 36.1*  PLT 163 207   BMET:  Recent Labs    07/22/23 0524 07/23/23 0731  NA 134* 134*  K 4.4 4.2  CL 101 101  CO2 22 23  GLUCOSE 271* 195*  BUN 34* 29*  CREATININE 1.24 1.10  CALCIUM 9.1 9.2    PT/INR:  Recent Labs    07/23/23 0731  LABPROT 20.0*  INR 1.7*   ABG    Component Value Date/Time   PHART 7.508 (H) 11/20/2020 0438   HCO3 24.8 09/09/2021 0313   TCO2 29 11/20/2020 0438   ACIDBASEDEF 6.0 (H) 11/24/2019 1718   O2SAT 63.4 09/09/2021 0313   CBG (last 3)  Recent Labs    07/22/23 2113 07/23/23 0632 07/23/23 1141   GLUCAP 251* 262* 172*    Assessment/Plan: S/P Procedure(s) (LRB): DRIVELINE WOUND DEBRIDEMENT (N/A) Continue current inpatient IV antibiotics and wound care. His nutritional status appears to be optimized. VAD function continues to be satisfactory. INR now  1.7-1.9  LOS: 16 days    Kevin Underwood 07/23/2023

## 2023-07-23 NOTE — Progress Notes (Signed)
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 Started on IV antibiotics.   Patient presented to VAD clinic 7/29 for driveline communication fault- stating that he has had "alarms every couple hrs from his VAD for a couple months." Pt has no showed and cancelled all appointments in the past 2 months. Pt also had green/blue drainage visible through his driveline bandage. (Dressing change and wound cx obtained). Pt states that he has experienced dizziness associated with a fall multiple times over the past couple months but no signs of bleeding. Controller and modular cable exchanged. No further communication fault alarms noted.   CT Chest/Abd/Pelvis 7/29 1. Similar appearance of mild circumferential thickening of the fat plane surrounding the drive line in the upper abdomen extending from the pump to the skin which may be chronic or represent an infectious process. No drainable fluid collection.  Pt had drive line debridement in OR with placement of instillation wound vac on 07/09/23 with Dr Donata Clay. This was change to wet/dry on Friday 8/2 due to ongoing alarms related to the pts hernia. Plan to return to OR for washout today.   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 Currently on IV Cefepime Q 8 hrs and IV Daptomycin 800 mg daily per ID team. ID team is recommending 4 weeks IV antibiotics. Pt is refusing to go a facility for IV antibiotics.   Sitting up on side of bed. Denies complaints. Wife at bedside.   Will plan to keep hospitalized while completing IV antibiotics.   Vital signs: Temp: 97.8 HR: 91 Doppler Pressure: 110 Automatic BP: 115/87 (97) O2 Sat: 95% on RA Wt: 193.3>194.8>196.4>194.7>197.9>198.6>198.2>200.8>201>201.9>205.5>206.6 lbs  LVAD interrogation reveals:  Speed: 5600 Flow: 4.4 Power: 4.3 w PI: 2.6 Alarms: none Events: on batteries Hct:  36  Fixed speed:  5600 Low speed limit:  5300  Drive Line: Existing VAD dressing removed and site care performed using sterile technique. Wound bed cleansed with VASHE solution and sterile 4x4 gauze through light manual debridement. Skin surrounding wound bed cleaned with Chlora prep applicators x 2, allowed to dry. VASHE moistened 4 x 4 placed in wound bed, covered with several dry 4 x 4s.  Drive line unincorporated. Moderate amount of serosanguinous/yellow drainage noted. No redness, tenderness, foul odor or rash noted. Drive line anchor secured.   Continue daily wet to dry dressing changes using VASHE solution per bedside RN or VAD coordinator. Next dressing change due 07/24/23 by bedside nurse.     Labs:  LDH trend: 121>133>130>138>172>175>165>158>169>155>170>167  INR trend: 1.0>1.1>1.2>1.4>1.3>1.4>1.5>1.4>1.8>1.9>1.7  Anticoagulation Plan: -INR Goal: 2.0 - 2.5 -ASA Dose: none  ICD: N/A  Infection:  07/07/23>> blood cultures>> no growth 5 days; final  07/07/23>> wound cx>> few pseudomonas aeruginosa and MRSA; final  07/09/23>> OR driveline cx>> Few pseudomonas aeruginosa and METHICILLIN RESISTANT STAPHYLOCOCCUS AUREUS 07/09/23>> blood cultures>>no growth x 5 days; final  Drips:    Plan/Recommendations:  Page VAD coordinator with equipment issues or driveline problems Daily drive line wet to dry dressing changes with VASHE solution.   Alyce Pagan RN VAD Coordinator  Office: [phone]  24/7 Pager: [phone]

## 2023-07-24 DIAGNOSIS — T827XXA Infection and inflammatory reaction due to other cardiac and vascular devices, implants and grafts, initial encounter: Secondary | ICD-10-CM | POA: Diagnosis not present

## 2023-07-24 LAB — CBC
HCT: 35.3 % — ABNORMAL LOW (ref 39.0–52.0)
Hemoglobin: 11.6 g/dL — ABNORMAL LOW (ref 13.0–17.0)
MCH: 28 pg (ref 26.0–34.0)
MCHC: 32.9 g/dL (ref 30.0–36.0)
MCV: 85.3 fL (ref 80.0–100.0)
Platelets: 188 10*3/uL (ref 150–400)
RBC: 4.14 MIL/uL — ABNORMAL LOW (ref 4.22–5.81)
RDW: 14.7 % (ref 11.5–15.5)
WBC: 7.8 10*3/uL (ref 4.0–10.5)
nRBC: 0 % (ref 0.0–0.2)

## 2023-07-24 LAB — BASIC METABOLIC PANEL
Anion gap: 9 (ref 5–15)
BUN: 29 mg/dL — ABNORMAL HIGH (ref 8–23)
CO2: 23 mmol/L (ref 22–32)
Calcium: 9.1 mg/dL (ref 8.9–10.3)
Chloride: 103 mmol/L (ref 98–111)
Creatinine, Ser: 0.97 mg/dL (ref 0.61–1.24)
GFR, Estimated: >60 mL/min (ref >60)
Glucose, Bld: 169 mg/dL — ABNORMAL HIGH (ref 70–99)
Potassium: 4.2 mmol/L (ref 3.5–5.1)
Sodium: 135 mmol/L (ref 135–145)

## 2023-07-24 LAB — GLUCOSE, CAPILLARY
Glucose-Capillary: 174 mg/dL — ABNORMAL HIGH (ref 70–99)
Glucose-Capillary: 206 mg/dL — ABNORMAL HIGH (ref 70–99)
Glucose-Capillary: 139 mg/dL — ABNORMAL HIGH (ref 70–99)
Glucose-Capillary: 181 mg/dL — ABNORMAL HIGH (ref 70–99)

## 2023-07-24 LAB — LACTATE DEHYDROGENASE: LDH: 173 U/L (ref 98–192)

## 2023-07-24 LAB — PROTIME-INR
INR: 1.7 — ABNORMAL HIGH (ref 0.8–1.2)
Prothrombin Time: 20.1 s — ABNORMAL HIGH (ref 11.4–15.2)

## 2023-07-24 LAB — MAGNESIUM: Magnesium: 2 mg/dL (ref 1.7–2.4)

## 2023-07-24 MED ORDER — TRAMADOL HCL 50 MG PO TABS
100.0000 mg | ORAL_TABLET | Freq: Four times a day (QID) | ORAL | Status: DC | PRN
  Administered 2023-07-24 – 2023-08-15 (×37): 100 mg via ORAL
  Filled 2023-07-24 (×40): qty 2

## 2023-07-24 NOTE — Progress Notes (Signed)
ANTICOAGULATION CONSULT NOTE  Pharmacy Consult for warfarin  Indication:  LVAD  Allergies  Allergen Reactions   Vancomycin Nausea And Vomiting and Other (See Comments)    Severe joint pain    Patient Measurements: Height: 5\' 10"  (177.8 cm) Weight: 94.3 kg (207 lb 14.3 oz) IBW/kg (Calculated) : 73 Heparin Dosing Weight: 91.4 kg   Vital Signs: Temp: 97.8 F (36.6 C) (08/15 0726) Temp Source: Oral (08/15 0726) BP: 104/91 (08/15 0726) Pulse Rate: 79 (08/15 0611)  Labs: Recent Labs    07/22/23 0524 07/23/23 0731 07/23/23 1154 07/24/23 0636  HGB 10.4*  --  11.8* 11.6*  HCT 32.3*  --  36.1* 35.3*  PLT 163  --  207 188  LABPROT 22.0* 20.0*  --  20.1*  INR 1.9* 1.7*  --  1.7*  CREATININE 1.24 1.10  --  0.97  CKTOTAL 56  --   --   --     Estimated Creatinine Clearance: 91 mL/min (by C-G formula based on SCr of 0.97 mg/dL).   Medical History: Past Medical History:  Diagnosis Date   "    Arthritis    CAD (coronary artery disease)    a. s/p CABG in 11/2019 with LIMA-LAD, SVG-OM1, SVG-PDA and SVG-D1   CHF (congestive heart failure) (HCC)    a. EF < 20% by echo in 11/2019   Essential hypertension    PAF (paroxysmal atrial fibrillation) (HCC)    Type 2 diabetes mellitus (HCC)     Medications:  Scheduled:   allopurinol  100 mg Oral Daily   amLODipine  10 mg Oral Daily   clonazepam  0.5 mg Oral BID   fluticasone furoate-vilanterol  1 puff Inhalation Daily   And   umeclidinium bromide  1 puff Inhalation Daily   gabapentin  300 mg Oral BID   insulin aspart  0-20 Units Subcutaneous TID WC   insulin aspart  0-5 Units Subcutaneous QHS   insulin glargine-yfgn  14 Units Subcutaneous Daily   losartan  25 mg Oral Daily   metoprolol succinate  25 mg Oral Daily   minocycline  200 mg Oral BID   mirtazapine  15 mg Oral QHS   pantoprazole  40 mg Oral Daily   sertraline  50 mg Oral Daily   tamsulosin  0.4 mg Oral Daily   thiamine  100 mg Oral Daily   traZODone  150 mg Oral  QHS   warfarin  6 mg Oral q1600   Warfarin - Pharmacist Dosing Inpatient   Does not apply q1600   zinc sulfate  220 mg Oral Daily    Assessment: 62 yom who presented to clinic with concern for driveline fracture and infection. Last [facility] appointment was on 04/21/2023 - INR was subtherapeutic at 1.4 and regimen at that time was 6 mg daily except 4 mg Tues/Thurs (LD 7/25).  Given low INR on admit, started fixed rate heparin 500 units/hr - now stopped. INR is slightly sub-therapeutic at 1.7 after lower dose earlier this week HGB stable 10-11, pltc stable 200s  LDH stable 100-200s No s/sx of bleeding. No further debridements.  Goal of Therapy:  INR 2-2.5 Monitor platelets by anticoagulation protocol: Yes   Plan:  Warfarin 6mg  daily Monitor daily heparin level, INR, CBC, LDH, and for s/sx of bleeding    Leota Sauers Pharm.D. CPP, BCPS Clinical Pharmacist [phone] 07/24/2023 8:11 AM    Please check AMION for all [facility] Pharmacy phone numbers After 10:00 PM, call Main Pharmacy [phone]

## 2023-07-24 NOTE — Progress Notes (Signed)
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 Started on IV antibiotics.   Patient presented to VAD clinic 7/29 for driveline communication fault- stating that he has had "alarms every couple hrs from his VAD for a couple months." Pt has no showed and cancelled all appointments in the past 2 months. Pt also had green/blue drainage visible through his driveline bandage. (Dressing change and wound cx obtained). Pt states that he has experienced dizziness associated with a fall multiple times over the past couple months but no signs of bleeding. Controller and modular cable exchanged. No further communication fault alarms noted.   CT Chest/Abd/Pelvis 7/29 1. Similar appearance of mild circumferential thickening of the fat plane surrounding the drive line in the upper abdomen extending from the pump to the skin which may be chronic or represent an infectious process. No drainable fluid collection.  Pt had drive line debridement in OR with placement of instillation wound vac on 07/09/23 with Dr Donata Clay. This was change to wet/dry on Friday 8/2 due to ongoing alarms related to the pts hernia.   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 Currently on IV Cefepime Q 8 hrs and IV Daptomycin 800 mg daily per ID team. ID team is recommending 4 weeks IV antibiotics. Pt is refusing to go a facility for IV antibiotics.   Sitting up on side of bed. Denies complaints.   Will plan to keep hospitalized while completing IV antibiotics.   Vital signs: Temp: 97.8 HR: 94 Doppler Pressure: not documented Automatic BP: 104/91 (97) O2 Sat: 96% on RA Wt: 193.3>194.8>196.4>194.7>197.9>198.6>198.2>200.8>201>201.9>205.5>206.6>207.8 lbs  LVAD interrogation reveals:  Speed: 5600 Flow: 4.1 Power: 4.3 w PI: 3.7 Alarms: none Events: 3 PI today Hct: 35  Fixed speed:  5600 Low speed limit:   5300  Drive Line: Existing VAD dressing removed and site care performed using sterile technique. Wound bed cleansed with VASHE solution and sterile 4x4 gauze through light manual debridement. Skin surrounding wound bed cleaned with Chlora prep applicators x 2, allowed to dry. VASHE moistened 4 x 4 placed in wound bed, covered with several dry 4 x 4s.  Drive line unincorporated. Moderate amount of serosanguinous/yellow drainage noted. No redness, tenderness, foul odor or rash noted. There is a skin tear from the tegaderm. This was covered with Vashe moistened gauze. Drive line anchor secured.   Continue daily wet to dry dressing changes using VASHE solution per bedside RN or VAD coordinator. Next dressing change due 07/25/23 by bedside nurse.       Labs:  LDH trend: 121>133>130>138>172>175>165>158>169>155>170>167>173  INR trend: 1.0>1.1>1.2>1.4>1.3>1.4>1.5>1.4>1.8>1.9>1.7  Anticoagulation Plan: -INR Goal: 2.0 - 2.5 -ASA Dose: none  ICD: N/A  Infection:  07/07/23>> blood cultures>> no growth 5 days; final  07/07/23>> wound cx>> few pseudomonas aeruginosa and MRSA; final  07/09/23>> OR driveline cx>> Few pseudomonas aeruginosa and METHICILLIN RESISTANT STAPHYLOCOCCUS AUREUS 07/09/23>> blood cultures>>no growth x 5 days; final  Drips:    Plan/Recommendations:  Page VAD coordinator with equipment issues or driveline problems Daily drive line wet to dry dressing changes with VASHE solution.   Carlton Adam RN VAD Coordinator  Office: [phone]  24/7 Pager: [phone]

## 2023-07-24 NOTE — Plan of Care (Signed)
  Problem: Education: Goal: Patient will understand all VAD equipment and how it functions Outcome: Progressing   Problem: Cardiac: Goal: LVAD will function as expected and patient will experience no clinical alarms Outcome: Progressing   Problem: Education: Goal: Knowledge of General Education information will improve Description: Including pain rating scale, medication(s)/side effects and non-pharmacologic comfort measures Outcome: Progressing   Problem: Health Behavior/Discharge Planning: Goal: Ability to manage health-related needs will improve Outcome: Progressing   Problem: Clinical Measurements: Goal: Ability to maintain clinical measurements within normal limits will improve Outcome: Progressing Goal: Will remain free from infection Outcome: Progressing   Problem: Nutrition: Goal: Adequate nutrition will be maintained Outcome: Progressing   Problem: Elimination: Goal: Will not experience complications related to bowel motility Outcome: Progressing Goal: Will not experience complications related to urinary retention Outcome: Progressing   Problem: Pain Managment: Goal: General experience of comfort will improve Outcome: Progressing

## 2023-07-24 NOTE — Plan of Care (Signed)
  Problem: Education: Goal: Knowledge of General Education information will improve Description: Including pain rating scale, medication(s)/side effects and non-pharmacologic comfort measures Outcome: Progressing   Problem: Education: Goal: Patient will understand all VAD equipment and how it functions Outcome: Progressing   Problem: Education: Goal: Knowledge of General Education information will improve Description: Including pain rating scale, medication(s)/side effects and non-pharmacologic comfort measures Outcome: Progressing   Problem: Health Behavior/Discharge Planning: Goal: Ability to manage health-related needs will improve Outcome: Progressing   Problem: Clinical Measurements: Goal: Ability to maintain clinical measurements within normal limits will improve Outcome: Progressing Goal: Will remain free from infection Outcome: Progressing   Problem: Nutrition: Goal: Adequate nutrition will be maintained Outcome: Progressing   Problem: Elimination: Goal: Will not experience complications related to bowel motility Outcome: Progressing Goal: Will not experience complications related to urinary retention Outcome: Progressing   Problem: Pain Managment: Goal: General experience of comfort will improve 07/24/2023 1629 by Marlou Sa, RN Outcome: Progressing 07/24/2023 1627 by Marlou Sa, RN Outcome: Progressing   Problem: Safety: Goal: Ability to remain free from injury will improve Outcome: Progressing   Problem: Skin Integrity: Goal: Risk for impaired skin integrity will decrease Outcome: Progressing   Problem: Fluid Volume: Goal: Ability to maintain a balanced intake and output will improve Outcome: Progressing   Problem: Nutritional: Goal: Maintenance of adequate nutrition will improve Outcome: Progressing Goal: Progress toward achieving an optimal weight will improve Outcome: Progressing   Problem: Skin Integrity: Goal: Risk for  impaired skin integrity will decrease Outcome: Progressing

## 2023-07-24 NOTE — Plan of Care (Signed)
  Problem: Education: Goal: Patient will understand all VAD equipment and how it functions Outcome: Progressing Goal: Patient will be able to verbalize current INR target range and antiplatelet therapy for discharge home Outcome: Progressing   Problem: Cardiac: Goal: LVAD will function as expected and patient will experience no clinical alarms Outcome: Progressing   Problem: Education: Goal: Knowledge of General Education information will improve Description: Including pain rating scale, medication(s)/side effects and non-pharmacologic comfort measures Outcome: Progressing   Problem: Health Behavior/Discharge Planning: Goal: Ability to manage health-related needs will improve Outcome: Progressing   Problem: Clinical Measurements: Goal: Ability to maintain clinical measurements within normal limits will improve Outcome: Progressing Goal: Will remain free from infection Outcome: Progressing Goal: Diagnostic test results will improve Outcome: Progressing Goal: Respiratory complications will improve Outcome: Progressing Goal: Cardiovascular complication will be avoided Outcome: Progressing   Problem: Activity: Goal: Risk for activity intolerance will decrease Outcome: Progressing   Problem: Nutrition: Goal: Adequate nutrition will be maintained Outcome: Progressing   Problem: Coping: Goal: Level of anxiety will decrease Outcome: Progressing   Problem: Elimination: Goal: Will not experience complications related to bowel motility Outcome: Progressing Goal: Will not experience complications related to urinary retention Outcome: Progressing   Problem: Pain Managment: Goal: General experience of comfort will improve Outcome: Progressing   Problem: Safety: Goal: Ability to remain free from injury will improve Outcome: Progressing   Problem: Skin Integrity: Goal: Risk for impaired skin integrity will decrease Outcome: Progressing   Problem: Education: Goal: Ability  to describe self-care measures that may prevent or decrease complications (Diabetes Survival Skills Education) will improve Outcome: Progressing Goal: Individualized Educational Video(s) Outcome: Progressing   Problem: Coping: Goal: Ability to adjust to condition or change in health will improve Outcome: Progressing   Problem: Fluid Volume: Goal: Ability to maintain a balanced intake and output will improve Outcome: Progressing   Problem: Health Behavior/Discharge Planning: Goal: Ability to identify and utilize available resources and services will improve Outcome: Progressing Goal: Ability to manage health-related needs will improve Outcome: Progressing   Problem: Metabolic: Goal: Ability to maintain appropriate glucose levels will improve Outcome: Progressing   Problem: Nutritional: Goal: Maintenance of adequate nutrition will improve Outcome: Progressing Goal: Progress toward achieving an optimal weight will improve Outcome: Progressing   Problem: Skin Integrity: Goal: Risk for impaired skin integrity will decrease Outcome: Progressing   Problem: Tissue Perfusion: Goal: Adequacy of tissue perfusion will improve Outcome: Progressing   

## 2023-07-24 NOTE — Progress Notes (Addendum)
Advanced Heart Failure VAD Team Note  PCP-Cardiologist: Nona Dell, MD  [facility]: Dr. Shirlee Latch   Subjective:    7/29: admitted w/ suspected DL fracture and DL infection. P/w "driveline communication fault" error message on LVAD. No pump stoppages. LVAD coordinator replaced modular cable and controller with resolution of driveline communication fault signal. CT C/A/P c/w prior imaging w/ similar appearance of mild circumferential thickening of the fat plane surrounding the drive line in the upper abdomen extending from the pump to the skin which may be chronic or represent an infectious process. No drainable fluid collection. Wound + BCx collected. Started on empiric daptomycin/Unasyn per ID.  7/30: Wound Cx growing Pseudomonas Aeruginosa (pan-sensitive) and S. aureus. Cefepime added 7/31: OR for wound debridement + application of wound vac.   8/8: Returned to OR for I&D 8/11: Losartan increased to 25 mg daily  Remains on Dapto, Cefepime and minocycline. Surgical wound with pseudomonas aeruginosa and MRSA. Bcx from 7/29 and 7/31 NG.   Denies SOB.   LVAD INTERROGATION:  HeartMate III LVAD:   Flow 4.36liters/min, speed 5600, power 4, PI  . Numerous PI events   Objective:    Vital Signs:   Temp:  [97.5 F (36.4 C)-98.1 F (36.7 C)] 97.8 F (36.6 C) (08/15 0726) Pulse Rate:  [79-95] 79 (08/15 0611) Resp:  [14-19] 19 (08/15 0726) BP: (81-129)/(65-97) 104/91 (08/15 0726) SpO2:  [94 %-96 %] 96 % (08/15 0910) Weight:  [94.3 kg] 94.3 kg (08/15 0611) Last BM Date : 07/21/23 Mean arterial Pressure low 100s  Intake/Output:   Intake/Output Summary (Last 24 hours) at 07/24/2023 0948 Last data filed at 07/24/2023 3295 Gross per 24 hour  Intake 726 ml  Output --  Net 726 ml    Maps 70-90s   Physical Exam  Physical Exam: GENERAL: No acute distress. HEENT: normal  NECK: Supple, JVP  .  2+ bilaterally, no bruits.  No lymphadenopathy or thyromegaly appreciated.   CARDIAC:  Mechanical  heart sounds with LVAD hum present.  LUNGS:  Clear to auscultation bilaterally.  ABDOMEN: Abd binder. + hernia   Soft, round, nontender, positive bowel sounds x4.     LVAD exit site:   Dressing dry and intact.  No erythema or drainage.  Stabilization device present and accurately applied.  Driveline dressing is being changed daily per sterile technique. EXTREMITIES:  Warm and dry, no cyanosis, clubbing, rash or edema  NEUROLOGIC:  Alert and oriented x 3.    No aphasia.  No dysarthria.  Affect pleasant.       Telemetry   SR 90s   Labs   Basic Metabolic Panel: Recent Labs  Lab 07/20/23 0625 07/21/23 0625 07/22/23 0524 07/23/23 0731 07/24/23 0636  NA 132* 134* 134* 134* 135  K 4.4 4.7 4.4 4.2 4.2  CL 101 100 101 101 103  CO2 23 27 22 23 23   GLUCOSE 271* 286* 271* 195* 169*  BUN 28* 28* 34* 29* 29*  CREATININE 1.09 1.25* 1.24 1.10 0.97  CALCIUM 8.5* 9.4 9.1 9.2 9.1  MG 1.9 1.9 1.9 1.9 2.0    Liver Function Tests: No results for input(s): "AST", "ALT", "ALKPHOS", "BILITOT", "PROT", "ALBUMIN" in the last 168 hours.  CBC: Recent Labs  Lab 07/20/23 0625 07/21/23 0625 07/22/23 0524 07/23/23 1154 07/24/23 0636  WBC 7.1 7.4 7.6 10.8* 7.8  HGB 10.2* 11.3* 10.4* 11.8* 11.6*  HCT 31.5* 35.7* 32.3* 36.1* 35.3*  MCV 88.2 86.2 85.7 87.0 85.3  PLT 162 175 163 207 188  INR: Recent Labs  Lab 07/20/23 0625 07/21/23 0625 07/22/23 0524 07/23/23 0731 07/24/23 0636  INR 1.8* 1.8* 1.9* 1.7* 1.7*    Other results:    Imaging   No results found.   Medications:     Scheduled Medications:  allopurinol  100 mg Oral Daily   amLODipine  10 mg Oral Daily   clonazepam  0.5 mg Oral BID   fluticasone furoate-vilanterol  1 puff Inhalation Daily   And   umeclidinium bromide  1 puff Inhalation Daily   gabapentin  300 mg Oral BID   insulin aspart  0-20 Units Subcutaneous TID WC   insulin aspart  0-5 Units Subcutaneous QHS   insulin glargine-yfgn  14 Units Subcutaneous  Daily   losartan  25 mg Oral Daily   metoprolol succinate  25 mg Oral Daily   minocycline  200 mg Oral BID   mirtazapine  15 mg Oral QHS   pantoprazole  40 mg Oral Daily   sertraline  50 mg Oral Daily   tamsulosin  0.4 mg Oral Daily   thiamine  100 mg Oral Daily   traZODone  150 mg Oral QHS   warfarin  6 mg Oral q1600   Warfarin - Pharmacist Dosing Inpatient   Does not apply q1600   zinc sulfate  220 mg Oral Daily    Infusions:  ceFEPime (MAXIPIME) IV 2 g (07/24/23 4098)   DAPTOmycin (CUBICIN) 800 mg in sodium chloride 0.9 % IVPB Stopped (07/23/23 1529)    PRN Medications: acetaminophen, fluticasone, HYDROcodone-acetaminophen   Patient Profile  62 y.o. 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 Now s/p Heartmate 3 LVAD. Now admitted w/ suspected DL fracture after multiple falls and recurrent DL infection.  Assessment/Plan:   1.  LVAD Driveline infection: MRSA driveline infection, admitted in 6/23 and again in 7/23-8/23 with multiple trips to the OR for debridement.  Cultures have grown acinetobacter and MRSA. He was readmitted with driveline infection in 2/24, completed course of daptomycin for MRSA. Now readmitted w/ suspected DL fracture and DL infection. P/w "driveline communication fault" error message on LVAD.LVAD coordinator replaced modular cable and controller with resolution of driveline communication fault signal. CT C/A/P c/w prior imaging w/ similar appearance of mild circumferential thickening of the fat plane surrounding the drive line in the upper abdomen extending from the pump to the skin which may be chronic or represent an infectious process. No drainable fluid collection.  - Started on empiric daptomycin/Unasyn per ID. Initial wound Cx + for Pseudomonas Aeruginosa  (pan-sensitive) and S. aureus. Cefepime added. Now on Dapto, Cefepime and minocycline. BCx NGTD. Taken to OR 7/31 for wound debridement + application of wound vac. Returned to OR 8/7 for wound wash out/debridement - continue abx per ID. Appreciate their assistance. ID recommends 4 weeks of daptomycin then Q 3 wks dalbavancin. Would have significant concern about him receiving IV abx at home with history of drug use and noncompliance. Would likely need to discharge to SNF if needs IV abx after discharge but he does not want to return to SNF. Plan to stay in hospital until completion of therapy  -WBC 7.8  - No changes  -  Continue IV abx  2. Suspected DL fracture with multiple recent falls - Has had multiple falls during the last 2 months, one  direct trauma to mid sternum (fell on cinder block) - CT chest/abd/pelvis per above  - CXR w/ LVAD partially imaged. The associated lead noted to be intact w/ no fracture or kink  - LVAD coordinators changed modular cable and controller with resolution  - No alarms   3.  Chronic systolic CHF s/p LVAD: Echo 10/21 with EF 20-25%, mildly decreased RV function. LHC/RHC in 12/21 with patent grafts, low output. Suspect mixed ischemic/nonischemic cardiomyopathy (prior heavy ETOH and drugs as well as CAD). Admitted with cardiogenic shock in 12/21, had placement of Impella 5.5 initially, now s/p Heartmate 3 LVAD on 11/17/20.  Ramp echo 2/23 with speed decreased to 5500 rpm. Speed now back to 5600 rpm.   - Volume status stable.     - Continue current Toprol XL 25 mg daily.  - Continue losartan 25 mg daily.  - INR 1.1 on admission. Suspect noncompliant with Warfarin. - INR 1.7 today. Dosing per PharmD  - VAD interrogated personally. Parameters stable.  4. CKD stage 3: Prior CTA abdomen/pelvis with high-grade stenosis of proximal right renal artery.  - Would consider eventual reassessment of renal artery stenosis and possible treatment  -Renal function stable.   5.   CAD: S/p CABG 12/20.  LHC pre-VAD with patent grafts, no target for intervention. - Continue Crestor 20 mg daily.  - No s/s angina  6.  Atrial fibrillation: Paroxysmal.  S/p Maze and LA appendage clip with CABG in 12/20.  - Maintaining SR.  - Continue Toprol XL 25 mg daily.  - Off amiodarone with hyperthyroidism.  7. Type 2 diabetes: improved control, Hgb A1c down from 12.1>>6.3  - continue SSI   8. 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 He says last use of methamphetamine was > 2 months ago. - UDS this admit negative (however collected > 48 hrs post admit)  - Declines substance use resources  9. 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  Followed by endocrinology. He is now off amiodarone. He had been on methimazole but has not been taking recently. TSH was high on admit and free T4 normal  - Will NOT restart methimazole   10. Anemia (chronic blood loss):  Hgb stable.   11. COPD: Prior smoker.  - Continue Trelegy.   12. PVCs/NSVT:  - Now off amiodarone.   13. Depression: Has turned to substance abuse to cope.   - Has been referred to OP psych in [place]   14.  Gout: Continue allopurinol, he has prn colchicine.   15. HTN: MAPs 70-90s  in the setting of known Rt RAS.  - monitor closely, if remains elevated may need reassessment of renal artery stenosis and possible treatment. Significant calcifications in both renal arteries on CT abdomen this admit. - Continue Amlodipine 10 mg + losartan 25 mg daily.   16. Agitation: - Klonopin increased, agitation improved  17. Incisional hernia: -Continue abdominal binder - Asking if he can get hernia repaired.   Continue ambulae.    I reviewed the LVAD parameters from today, and compared the results to the patient's prior recorded data.  No programming changes were made.  The LVAD is functioning within specified parameters.  The patient performs LVAD self-test daily.  LVAD interrogation was negative  for any significant power changes, alarms or PI events/speed drops.  LVAD equipment check completed and is in good working order.  Back-up equipment present.   LVAD education done on emergency procedures and precautions and reviewed exit site care.  Length of  Stay: 64  Tonye Becket, NP 07/24/2023, 9:48 AM  VAD Team --- VAD ISSUES ONLY--- Pager [phone] (7am - 7am)  Advanced Heart Failure Team  Pager [phone] (M-F; 7a - 5p)  Please contact [facility] for night-coverage after hours (5p -7a ) and weekends on [email or web address]   Patient seen and examined with the above-signed Advanced Practice Provider and/or Housestaff. I personally reviewed laboratory data, imaging studies and relevant notes. I independently examined the patient and formulated the important aspects of the plan. I have edited the note to reflect any of my changes or salient points. I have personally discussed the plan with the patient and/or family.  Feels ok. Hernia still bothering him. On IV abx. No f/c.   INR 1.7  General:  NAD.  HEENT: normal  Neck: supple. JVP not elevated.  Carotids 2+ bilat; no bruits. No lymphadenopathy or thryomegaly appreciated. Cor: LVAD hum.  Lungs: Clear. Abdomen: soft, nontender,large incisional hernia. Wound dressing ok  No hepatosplenomegaly. No bruits or masses. Good bowel sounds. Driveline site clean. Anchor in place.  Extremities: no cyanosis, clubbing, rash. Warm no edema  Neuro: alert & oriented x 3. No focal deficits. Moves all 4 without problem   Continue IV abx. Continue wound dressing changes.   Wearing ab binder.   VAD interrogated personally. Parameters stable.  INR 1.7. Discussed dosing with PharmD personally.  Arvilla Meres, MD  1:49 PM

## 2023-07-25 DIAGNOSIS — T827XXA Infection and inflammatory reaction due to other cardiac and vascular devices, implants and grafts, initial encounter: Secondary | ICD-10-CM | POA: Diagnosis not present

## 2023-07-25 LAB — BASIC METABOLIC PANEL
Anion gap: 9 (ref 5–15)
BUN: 27 mg/dL — ABNORMAL HIGH (ref 8–23)
CO2: 24 mmol/L (ref 22–32)
Calcium: 9 mg/dL (ref 8.9–10.3)
Chloride: 104 mmol/L (ref 98–111)
Creatinine, Ser: 1 mg/dL (ref 0.61–1.24)
GFR, Estimated: >60 mL/min (ref >60)
Glucose, Bld: 195 mg/dL — ABNORMAL HIGH (ref 70–99)
Potassium: 4 mmol/L (ref 3.5–5.1)
Sodium: 137 mmol/L (ref 135–145)

## 2023-07-25 LAB — GLUCOSE, CAPILLARY
Glucose-Capillary: 204 mg/dL — ABNORMAL HIGH (ref 70–99)
Glucose-Capillary: 93 mg/dL (ref 70–99)
Glucose-Capillary: 164 mg/dL — ABNORMAL HIGH (ref 70–99)
Glucose-Capillary: 144 mg/dL — ABNORMAL HIGH (ref 70–99)

## 2023-07-25 LAB — CBC
HCT: 33 % — ABNORMAL LOW (ref 39.0–52.0)
Hemoglobin: 10.7 g/dL — ABNORMAL LOW (ref 13.0–17.0)
MCH: 28.5 pg (ref 26.0–34.0)
MCHC: 32.4 g/dL (ref 30.0–36.0)
MCV: 88 fL (ref 80.0–100.0)
Platelets: 164 10*3/uL (ref 150–400)
RBC: 3.75 MIL/uL — ABNORMAL LOW (ref 4.22–5.81)
RDW: 14.8 % (ref 11.5–15.5)
WBC: 6.3 10*3/uL (ref 4.0–10.5)
nRBC: 0 % (ref 0.0–0.2)

## 2023-07-25 LAB — LACTATE DEHYDROGENASE: LDH: 161 U/L (ref 98–192)

## 2023-07-25 LAB — PROTIME-INR
INR: 1.8 — ABNORMAL HIGH (ref 0.8–1.2)
Prothrombin Time: 20.7 s — ABNORMAL HIGH (ref 11.4–15.2)

## 2023-07-25 LAB — MAGNESIUM: Magnesium: 2 mg/dL (ref 1.7–2.4)

## 2023-07-25 MED ORDER — MORPHINE SULFATE (PF) 2 MG/ML IV SOLN
2.0000 mg | Freq: Once | INTRAVENOUS | Status: AC
  Administered 2023-07-25: 2 mg via INTRAVENOUS
  Filled 2023-07-25: qty 1

## 2023-07-25 NOTE — Progress Notes (Addendum)
Advanced Heart Failure VAD Team Note  PCP-Cardiologist: Nona Dell, MD  [facility]: Dr. Shirlee Latch   Subjective:    7/29: admitted w/ suspected DL fracture and DL infection. P/w "driveline communication fault" error message on LVAD. No pump stoppages. LVAD coordinator replaced modular cable and controller with resolution of driveline communication fault signal. CT C/A/P c/w prior imaging w/ similar appearance of mild circumferential thickening of the fat plane surrounding the drive line in the upper abdomen extending from the pump to the skin which may be chronic or represent an infectious process. No drainable fluid collection. Wound + BCx collected. Started on empiric daptomycin/Unasyn per ID.  7/30: Wound Cx growing Pseudomonas Aeruginosa (pan-sensitive) and S. aureus. Cefepime added 7/31: OR for wound debridement + application of wound vac.   8/8: Returned to OR for I&D 8/11: Losartan increased to 25 mg daily  Remains on Dapto, Cefepime and minocycline. Surgical wound with pseudomonas aeruginosa and MRSA. Bcx from 7/29 and 7/31 NG.  Having a hard time urinating. Slept ok.    LVAD INTERROGATION:  HeartMate III LVAD:   Flow 4.3 liters/min, speed 5600, power 4, PI  2.7  . Currently on batteries.   Objective:    Vital Signs:   Temp:  [97.8 F (36.6 C)-98.2 F (36.8 C)] 97.8 F (36.6 C) (08/16 0618) Pulse Rate:  [76-88] 79 (08/16 0618) Resp:  [13-19] 16 (08/16 0618) BP: (101-123)/(80-94) 106/80 (08/15 2258) SpO2:  [92 %-99 %] 92 % (08/16 0618) Weight:  [94.9 kg] 94.9 kg (08/16 0618) Last BM Date : 07/24/23 Mean arterial Pressure 90s  Intake/Output:   Intake/Output Summary (Last 24 hours) at 07/25/2023 0716 Last data filed at 07/25/2023 2725 Gross per 24 hour  Intake 486 ml  Output --  Net 486 ml      Physical Exam  Physical Exam: GENERAL: No acute distress.In bed.  HEENT: normal  NECK: Supple, JVP flat .  2+ bilaterally, no bruits.  No lymphadenopathy or thyromegaly  appreciated.   CARDIAC:  Mechanical heart sounds with LVAD hum present.  LUNGS:  Clear to auscultation bilaterally.  ABDOMEN:   + hernia,  round, nontender, positive bowel sounds x4.     LVAD exit site:Dressing in place with some drainage noted.  EXTREMITIES:  Warm and dry, no cyanosis, clubbing, rash or edema  NEUROLOGIC:  Alert and oriented x 3.    No aphasia.  No dysarthria.  Affect pleasant.       Telemetry   SR   Labs   Basic Metabolic Panel: Recent Labs  Lab 07/20/23 0625 07/21/23 0625 07/22/23 0524 07/23/23 0731 07/24/23 0636  NA 132* 134* 134* 134* 135  K 4.4 4.7 4.4 4.2 4.2  CL 101 100 101 101 103  CO2 23 27 22 23 23   GLUCOSE 271* 286* 271* 195* 169*  BUN 28* 28* 34* 29* 29*  CREATININE 1.09 1.25* 1.24 1.10 0.97  CALCIUM 8.5* 9.4 9.1 9.2 9.1  MG 1.9 1.9 1.9 1.9 2.0    Liver Function Tests: No results for input(s): "AST", "ALT", "ALKPHOS", "BILITOT", "PROT", "ALBUMIN" in the last 168 hours.  CBC: Recent Labs  Lab 07/20/23 0625 07/21/23 0625 07/22/23 0524 07/23/23 1154 07/24/23 0636  WBC 7.1 7.4 7.6 10.8* 7.8  HGB 10.2* 11.3* 10.4* 11.8* 11.6*  HCT 31.5* 35.7* 32.3* 36.1* 35.3*  MCV 88.2 86.2 85.7 87.0 85.3  PLT 162 175 163 207 188    INR: Recent Labs  Lab 07/20/23 0625 07/21/23 3664 07/22/23 0524 07/23/23 0731 07/24/23 0636  INR 1.8* 1.8* 1.9* 1.7* 1.7*    Other results:    Imaging   No results found.   Medications:     Scheduled Medications:  allopurinol  100 mg Oral Daily   amLODipine  10 mg Oral Daily   clonazepam  0.5 mg Oral BID   fluticasone furoate-vilanterol  1 puff Inhalation Daily   And   umeclidinium bromide  1 puff Inhalation Daily   gabapentin  300 mg Oral BID   insulin aspart  0-20 Units Subcutaneous TID WC   insulin aspart  0-5 Units Subcutaneous QHS   insulin glargine-yfgn  14 Units Subcutaneous Daily   losartan  25 mg Oral Daily   metoprolol succinate  25 mg Oral Daily   minocycline  200 mg Oral BID    mirtazapine  15 mg Oral QHS   pantoprazole  40 mg Oral Daily   sertraline  50 mg Oral Daily   tamsulosin  0.4 mg Oral Daily   thiamine  100 mg Oral Daily   traZODone  150 mg Oral QHS   warfarin  6 mg Oral q1600   Warfarin - Pharmacist Dosing Inpatient   Does not apply q1600   zinc sulfate  220 mg Oral Daily    Infusions:  ceFEPime (MAXIPIME) IV 2 g (07/25/23 0626)   DAPTOmycin (CUBICIN) 800 mg in sodium chloride 0.9 % IVPB Stopped (07/24/23 1509)    PRN Medications: acetaminophen, fluticasone, HYDROcodone-acetaminophen, traMADol   Patient Profile  62 y.o. 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 Now s/p Heartmate 3 LVAD. Now admitted w/ suspected DL fracture after multiple falls and recurrent DL infection.  Assessment/Plan:   1.  LVAD Driveline infection: MRSA driveline infection, admitted in 6/23 and again in 7/23-8/23 with multiple trips to the OR for debridement.  Cultures have grown acinetobacter and MRSA. He was readmitted with driveline infection in 2/24, completed course of daptomycin for MRSA. Now readmitted w/ suspected DL fracture and DL infection. P/w "driveline communication fault" error message on LVAD.LVAD coordinator replaced modular cable and controller with resolution of driveline communication fault signal. CT C/A/P c/w prior imaging w/ similar appearance of mild circumferential thickening of the fat plane surrounding the drive line in the upper abdomen extending from the pump to the skin which may be chronic or represent an infectious process. No drainable fluid collection.  - Started on empiric daptomycin/Unasyn per ID. Initial wound Cx + for Pseudomonas Aeruginosa (pan-sensitive) and S. aureus. Cefepime added. Now on Dapto, Cefepime and minocycline. BCx NGTD. Taken to OR  7/31 for wound debridement + application of wound vac. Returned to OR 8/7 for wound wash out/debridement - continue abx per ID. Appreciate their assistance. ID recommends 4 weeks of daptomycin then Q 3 wks dalbavancin. Would have significant concern about him receiving IV abx at home with history of drug use and noncompliance. Would likely need to discharge to SNF if needs IV abx after discharge but he does not want to return to SNF. Plan to stay in hospital until completion of therapy  - Having more drainage around driveline.  -Labs pending.  - No changes  -  Continue IV abx  2. Suspected DL fracture with multiple recent falls - Has had multiple falls during the last 2 months, one direct trauma to mid sternum (fell on cinder  block) - CT chest/abd/pelvis per above  - CXR w/ LVAD partially imaged. The associated lead noted to be intact w/ no fracture or kink  - LVAD coordinators changed modular cable and controller with resolution  - No alarms   3.  Chronic systolic CHF s/p LVAD: Echo 10/21 with EF 20-25%, mildly decreased RV function. LHC/RHC in 12/21 with patent grafts, low output. Suspect mixed ischemic/nonischemic cardiomyopathy (prior heavy ETOH and drugs as well as CAD). Admitted with cardiogenic shock in 12/21, had placement of Impella 5.5 initially, now s/p Heartmate 3 LVAD on 11/17/20.  Ramp echo 2/23 with speed decreased to 5500 rpm. Speed now back to 5600 rpm.   - Volume status stable.     - Continue current Toprol XL 25 mg daily.  - Continue losartan 25 mg daily.  - INR 1.1 on admission. Suspect noncompliant with Warfarin. - INR pending. Dosing per PharmD  - VAD interrogated personally. Parameters stable.  4. CKD stage 3: Prior CTA abdomen/pelvis with high-grade stenosis of proximal right renal artery.  - Would consider eventual reassessment of renal artery stenosis and possible treatment  -BMET pending.    5.  CAD: S/p CABG 12/20.  LHC pre-VAD with patent grafts, no target for  intervention. - Continue Crestor 20 mg daily.  - No s/s angina  6.  Atrial fibrillation: Paroxysmal.  S/p Maze and LA appendage clip with CABG in 12/20.  - Maintaining SR.  - Continue Toprol XL 25 mg daily.  - Off amiodarone with hyperthyroidism.  7. Type 2 diabetes: improved control, Hgb A1c down from 12.1>>6.3  - continue SSI   8. 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 He says last use of methamphetamine was > 2 months ago. - UDS this admit negative (however collected > 48 hrs post admit)  - Declines substance use resources  9. 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  Followed by endocrinology. He is now off amiodarone. He had been on methimazole but has not been taking recently. TSH was high on admit and free T4 normal  - Will NOT restart methimazole   10. Anemia (chronic blood loss):  Hgb stable.   11. COPD: Prior smoker.  - Continue Trelegy.   12. PVCs/NSVT:  - Now off amiodarone.   13. Depression: Has turned to substance abuse to cope.   - Has been referred to OP psych in [place]   14.  Gout: Continue allopurinol, he has prn colchicine.   15. HTN: MAPs 90s  in the setting of known Rt RAS.  - monitor closely, if remains elevated may need reassessment of renal artery stenosis and possible treatment. Significant calcifications in both renal arteries on CT abdomen this admit. - Continue Amlodipine 10 mg + losartan 25 mg daily.   16. Agitation: - Klonopin increased, agitation improved  17. Incisional hernia: -Continue abdominal binder - Asking if he can get hernia repaired.    Having difficulty urinating. Check bladder scan. May need to add flomax.    Labs pending. He request late lab draw.   I reviewed the LVAD parameters from today, and compared the results to the patient's prior recorded data.  No programming changes were made.  The LVAD is functioning within specified parameters.  The patient performs LVAD self-test daily.  LVAD  interrogation was negative for any significant power changes, alarms or PI events/speed drops.  LVAD equipment check completed and is in good working order.  Back-up equipment present.   LVAD education done on emergency procedures  and precautions and reviewed exit site care.  Length of Stay: 37  Tonye Becket, NP 07/25/2023, 7:16 AM  VAD Team --- VAD ISSUES ONLY--- Pager [phone] (7am - 7am)  Advanced Heart Failure Team  Pager [phone] (M-F; 7a - 5p)  Please contact [facility] for night-coverage after hours (5p -7a ) and weekends on [email or web address]   Patient seen and examined with the above-signed Advanced Practice Provider and/or Housestaff. I personally reviewed laboratory data, imaging studies and relevant notes. I independently examined the patient and formulated the important aspects of the plan. I have edited the note to reflect any of my changes or salient points. I have personally discussed the plan with the patient and/or family.   Continues to complain of pain at DL site and with incisional hernia. Requesting more narcotics.  Remains on IV abx. No f/c. Dressing changes being done with VASHE solution.   General:  NAD.  HEENT: normal  Neck: supple. JVP not elevated.  Carotids 2+ bilat; no bruits. No lymphadenopathy or thryomegaly appreciated. Cor: LVAD hum.  Lungs: Clear. Abdomen: soft, huge incisional hernia. DL dressing intact. Mildly tender Good bowel sounds. Anchor in place.  Extremities: no cyanosis, clubbing, rash. Warm no edema  Neuro: alert & oriented x 3. No focal deficits. Moves all 4 without problem   - Continue IV abx until 8/23 per ID.  - Continue wet-to-dry dressings with VASHE.  - VAD interrogated personally. Parameters stable. - Volume status ok.  - INR 1.8. Discussed dosing with PharmD personally. - Have added tramadol for pain would avoid further increases in narcotics if possible given ongoing drug use history.   Arvilla Meres, MD  9:13 PM

## 2023-07-25 NOTE — Plan of Care (Signed)
  Problem: Education: Goal: Patient will understand all VAD equipment and how it functions Outcome: Progressing Goal: Patient will be able to verbalize current INR target range and antiplatelet therapy for discharge home Outcome: Progressing   Problem: Cardiac: Goal: LVAD will function as expected and patient will experience no clinical alarms Outcome: Progressing   Problem: Education: Goal: Knowledge of General Education information will improve Description: Including pain rating scale, medication(s)/side effects and non-pharmacologic comfort measures Outcome: Progressing   Problem: Health Behavior/Discharge Planning: Goal: Ability to manage health-related needs will improve Outcome: Progressing   Problem: Clinical Measurements: Goal: Ability to maintain clinical measurements within normal limits will improve Outcome: Progressing Goal: Will remain free from infection Outcome: Progressing Goal: Diagnostic test results will improve Outcome: Progressing Goal: Respiratory complications will improve Outcome: Progressing Goal: Cardiovascular complication will be avoided Outcome: Progressing   Problem: Activity: Goal: Risk for activity intolerance will decrease Outcome: Progressing   Problem: Nutrition: Goal: Adequate nutrition will be maintained Outcome: Progressing   Problem: Coping: Goal: Level of anxiety will decrease Outcome: Progressing   Problem: Elimination: Goal: Will not experience complications related to bowel motility Outcome: Progressing Goal: Will not experience complications related to urinary retention Outcome: Progressing   Problem: Pain Managment: Goal: General experience of comfort will improve Outcome: Progressing   Problem: Safety: Goal: Ability to remain free from injury will improve Outcome: Progressing   Problem: Skin Integrity: Goal: Risk for impaired skin integrity will decrease Outcome: Progressing   Problem: Education: Goal: Ability  to describe self-care measures that may prevent or decrease complications (Diabetes Survival Skills Education) will improve Outcome: Progressing Goal: Individualized Educational Video(s) Outcome: Progressing   Problem: Coping: Goal: Ability to adjust to condition or change in health will improve Outcome: Progressing   Problem: Fluid Volume: Goal: Ability to maintain a balanced intake and output will improve Outcome: Progressing   Problem: Health Behavior/Discharge Planning: Goal: Ability to identify and utilize available resources and services will improve Outcome: Progressing Goal: Ability to manage health-related needs will improve Outcome: Progressing   Problem: Metabolic: Goal: Ability to maintain appropriate glucose levels will improve Outcome: Progressing   Problem: Nutritional: Goal: Maintenance of adequate nutrition will improve Outcome: Progressing Goal: Progress toward achieving an optimal weight will improve Outcome: Progressing   Problem: Skin Integrity: Goal: Risk for impaired skin integrity will decrease Outcome: Progressing   Problem: Tissue Perfusion: Goal: Adequacy of tissue perfusion will improve Outcome: Progressing   

## 2023-07-25 NOTE — Progress Notes (Signed)
ANTICOAGULATION CONSULT NOTE  Pharmacy Consult for warfarin  Indication:  LVAD  Allergies  Allergen Reactions   Vancomycin Nausea And Vomiting and Other (See Comments)    Severe joint pain    Patient Measurements: Height: 5\' 10"  (177.8 cm) Weight: 94.9 kg (209 lb 3.5 oz) IBW/kg (Calculated) : 73 Heparin Dosing Weight: 91.4 kg   Vital Signs: Temp: 97.7 F (36.5 C) (08/16 0806) Temp Source: Oral (08/16 0806) BP: 91/81 (08/16 0806) Pulse Rate: 75 (08/16 0806)  Labs: Recent Labs    07/23/23 0731 07/23/23 1154 07/23/23 1154 07/24/23 0636 07/25/23 0714  HGB  --  11.8*   < > 11.6* 10.7*  HCT  --  36.1*  --  35.3* 33.0*  PLT  --  207  --  188 164  LABPROT 20.0*  --   --  20.1* 20.7*  INR 1.7*  --   --  1.7* 1.8*  CREATININE 1.10  --   --  0.97 1.00   < > = values in this interval not displayed.    Estimated Creatinine Clearance: 88.6 mL/min (by C-G formula based on SCr of 1 mg/dL).   Medical History: Past Medical History:  Diagnosis Date   "    Arthritis    CAD (coronary artery disease)    a. s/p CABG in 11/2019 with LIMA-LAD, SVG-OM1, SVG-PDA and SVG-D1   CHF (congestive heart failure) (HCC)    a. EF < 20% by echo in 11/2019   Essential hypertension    PAF (paroxysmal atrial fibrillation) (HCC)    Type 2 diabetes mellitus (HCC)     Medications:  Scheduled:   allopurinol  100 mg Oral Daily   amLODipine  10 mg Oral Daily   clonazepam  0.5 mg Oral BID   fluticasone furoate-vilanterol  1 puff Inhalation Daily   And   umeclidinium bromide  1 puff Inhalation Daily   gabapentin  300 mg Oral BID   insulin aspart  0-20 Units Subcutaneous TID WC   insulin aspart  0-5 Units Subcutaneous QHS   insulin glargine-yfgn  14 Units Subcutaneous Daily   losartan  25 mg Oral Daily   metoprolol succinate  25 mg Oral Daily   minocycline  200 mg Oral BID   mirtazapine  15 mg Oral QHS    morphine injection  2 mg Intravenous Once   pantoprazole  40 mg Oral Daily    sertraline  50 mg Oral Daily   tamsulosin  0.4 mg Oral Daily   thiamine  100 mg Oral Daily   traZODone  150 mg Oral QHS   warfarin  6 mg Oral q1600   Warfarin - Pharmacist Dosing Inpatient   Does not apply q1600   zinc sulfate  220 mg Oral Daily    Assessment: 62 yom who presented to clinic with concern for driveline fracture and infection. Last [facility] appointment was on 04/21/2023 - INR was subtherapeutic at 1.4 and regimen at that time was 6 mg daily except 4 mg Tues/Thurs (LD 7/25).  Given low INR on admit, started fixed rate heparin 500 units/hr - now stopped. INR is slightly sub-therapeutic at 1.8 but up trending on increased dose s/p lower dose earlier this week HGB stable 10-11, pltc stable 200s  LDH stable 100-200s No s/sx of bleeding. No further debridements.  Goal of Therapy:  INR 2-2.5 Monitor platelets by anticoagulation protocol: Yes   Plan:  Warfarin 6mg  daily Monitor daily heparin level, INR, CBC, LDH, and for s/sx of bleeding  Leota Sauers Pharm.D. CPP, BCPS Clinical Pharmacist [phone] 07/25/2023 11:03 AM    Please check AMION for all [facility] phone numbers After 10:00 PM, call Main Pharmacy [phone]

## 2023-07-25 NOTE — Progress Notes (Signed)
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 Started on IV antibiotics.   Patient presented to VAD clinic 7/29 for driveline communication fault- stating that he has had "alarms every couple hrs from his VAD for a couple months." Pt has no showed and cancelled all appointments in the past 2 months. Pt also had green/blue drainage visible through his driveline bandage. (Dressing change and wound cx obtained). Pt states that he has experienced dizziness associated with a fall multiple times over the past couple months but no signs of bleeding. Controller and modular cable exchanged. No further communication fault alarms noted.   CT Chest/Abd/Pelvis 7/29 1. Similar appearance of mild circumferential thickening of the fat plane surrounding the drive line in the upper abdomen extending from the pump to the skin which may be chronic or represent an infectious process. No drainable fluid collection.  Pt had drive line debridement in OR with placement of instillation wound vac on 07/09/23 with Dr Donata Clay. This was change to wet/dry on Friday 8/2 due to ongoing alarms related to the pts hernia.   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 Currently on IV Cefepime Q 8 hrs and IV Daptomycin 800 mg daily per ID team. ID team is recommending 4 weeks IV antibiotics. Pt is refusing to go a facility for IV antibiotics.   Laying in bed. Frustrated that he is unable to have more pain medicine currently.   Will plan to keep hospitalized while completing IV antibiotics.   Vital signs: Temp: 97.7 HR: 70 Doppler Pressure: not documented Automatic BP: 91/81 (86) O2 Sat: 96% on RA Wt: 193.3>194.8>196.4>194.7>197.9>198.6>198.2>200.8>201>201.9>205.5>206.6>207.8>209.2 lbs  LVAD interrogation reveals:  Speed: 5600 Flow: 4.0 Power: 4.2 w PI: 3.8 Alarms: none Events: 6 PI today Hct:  35  Fixed speed:  5600 Low speed limit:  5300  Drive Line: Existing VAD dressing removed and site care performed using sterile technique. Wound bed cleansed with VASHE solution and sterile 4x4 gauze through light manual debridement. Skin surrounding wound bed cleaned with Chlora prep applicators x 2, allowed to dry. VASHE moistened 4 x 4 placed in wound bed, covered with several dry 4 x 4s.  Drive line unincorporated. Moderate amount of serosanguinous/yellow drainage noted. No redness, tenderness, foul odor or rash noted. There is a skin tear from the tegaderm. This was covered with Vashe moistened gauze. Drive line anchor secured.   Continue daily wet to dry dressing changes using VASHE solution per bedside RN or VAD coordinator. Next dressing change due 07/26/23 by bedside nurse.       Labs:  LDH trend: 121>133>130>138>172>175>165>158>169>155>170>167>173>161  INR trend: 1.0>1.1>1.2>1.4>1.3>1.4>1.5>1.4>1.8>1.9>1.7>1.8  Anticoagulation Plan: -INR Goal: 2.0 - 2.5 -ASA Dose: none  ICD: N/A  Infection:  07/07/23>> blood cultures>> no growth 5 days; final  07/07/23>> wound cx>> few pseudomonas aeruginosa and MRSA; final  07/09/23>> OR driveline cx>> Few pseudomonas aeruginosa and METHICILLIN RESISTANT STAPHYLOCOCCUS AUREUS 07/09/23>> blood cultures>>no growth x 5 days; final  Drips:    Plan/Recommendations:  Page VAD coordinator with equipment issues or driveline problems Daily drive line wet to dry dressing changes with VASHE solution.   Alyce Pagan RN VAD Coordinator  Office: [phone]  24/7 Pager: [phone]

## 2023-07-26 DIAGNOSIS — T827XXA Infection and inflammatory reaction due to other cardiac and vascular devices, implants and grafts, initial encounter: Secondary | ICD-10-CM | POA: Diagnosis not present

## 2023-07-26 LAB — BASIC METABOLIC PANEL
Anion gap: 11 (ref 5–15)
BUN: 25 mg/dL — ABNORMAL HIGH (ref 8–23)
CO2: 24 mmol/L (ref 22–32)
Calcium: 8.8 mg/dL — ABNORMAL LOW (ref 8.9–10.3)
Chloride: 105 mmol/L (ref 98–111)
Creatinine, Ser: 1.2 mg/dL (ref 0.61–1.24)
GFR, Estimated: >60 mL/min (ref >60)
Glucose, Bld: 105 mg/dL — ABNORMAL HIGH (ref 70–99)
Potassium: 3.7 mmol/L (ref 3.5–5.1)
Sodium: 140 mmol/L (ref 135–145)

## 2023-07-26 LAB — CBC
HCT: 30 % — ABNORMAL LOW (ref 39.0–52.0)
Hemoglobin: 9.7 g/dL — ABNORMAL LOW (ref 13.0–17.0)
MCH: 27.8 pg (ref 26.0–34.0)
MCHC: 32.3 g/dL (ref 30.0–36.0)
MCV: 86 fL (ref 80.0–100.0)
Platelets: 164 10*3/uL (ref 150–400)
RBC: 3.49 MIL/uL — ABNORMAL LOW (ref 4.22–5.81)
RDW: 14.8 % (ref 11.5–15.5)
WBC: 4.9 10*3/uL (ref 4.0–10.5)
nRBC: 0 % (ref 0.0–0.2)

## 2023-07-26 LAB — GLUCOSE, CAPILLARY
Glucose-Capillary: 185 mg/dL — ABNORMAL HIGH (ref 70–99)
Glucose-Capillary: 96 mg/dL (ref 70–99)
Glucose-Capillary: 202 mg/dL — ABNORMAL HIGH (ref 70–99)
Glucose-Capillary: 134 mg/dL — ABNORMAL HIGH (ref 70–99)

## 2023-07-26 LAB — PROTIME-INR
INR: 1.8 — ABNORMAL HIGH (ref 0.8–1.2)
Prothrombin Time: 21.3 s — ABNORMAL HIGH (ref 11.4–15.2)

## 2023-07-26 LAB — LACTATE DEHYDROGENASE: LDH: 145 U/L (ref 98–192)

## 2023-07-26 LAB — MAGNESIUM: Magnesium: 1.9 mg/dL (ref 1.7–2.4)

## 2023-07-26 MED ORDER — OXYCODONE HCL 5 MG PO TABS
5.0000 mg | ORAL_TABLET | Freq: Four times a day (QID) | ORAL | Status: DC | PRN
  Administered 2023-07-26 – 2023-07-30 (×12): 5 mg via ORAL
  Filled 2023-07-26 (×12): qty 1

## 2023-07-26 MED ORDER — SPIRONOLACTONE 12.5 MG HALF TABLET
12.5000 mg | ORAL_TABLET | Freq: Every day | ORAL | Status: DC
  Administered 2023-07-26 – 2023-07-27 (×2): 12.5 mg via ORAL
  Filled 2023-07-26 (×2): qty 1

## 2023-07-26 NOTE — Progress Notes (Signed)
Patient ID: Kevin Rigg., male   DOB: 03-Jun-1961, 62 y.o.   MRN: [id number]   Advanced Heart Failure VAD Team Note  PCP-Cardiologist: Nona Dell, MD  [facility]: Dr. Shirlee Latch   Subjective:    7/29: admitted w/ suspected DL fracture and DL infection. P/w "driveline communication fault" error message on LVAD. No pump stoppages. LVAD coordinator replaced modular cable and controller with resolution of driveline communication fault signal. CT C/A/P c/w prior imaging w/ similar appearance of mild circumferential thickening of the fat plane surrounding the drive line in the upper abdomen extending from the pump to the skin which may be chronic or represent an infectious process. No drainable fluid collection. Wound + BCx collected. Started on empiric daptomycin/Unasyn per ID.  7/30: Wound Cx growing Pseudomonas Aeruginosa (pan-sensitive) and S. aureus. Cefepime added 7/31: OR for wound debridement + application of wound vac.   8/8: Returned to OR for I&D 8/11: Losartan increased to 25 mg daily  Remains on Daptomycin, Cefepime and minocycline. Surgical wound with pseudomonas aeruginosa and MRSA. Bcx from 7/29 and 7/31 NGTD.  MAP elevated 100s.   Very frustrated.  Has nagging pain below his large abdominal wall hernia, feels like Norco not effective.   LVAD INTERROGATION:  HeartMate III LVAD:   Flow 4 liters/min, speed 5600, power 4.2, PI  4.   Objective:    Vital Signs:   Temp:  [97.6 F (36.4 C)-98.2 F (36.8 C)] 97.6 F (36.4 C) (08/17 0833) Pulse Rate:  [75-83] 83 (08/17 0833) Resp:  [12-20] 20 (08/17 0833) BP: (114-139)/(96-102) 114/96 (08/17 0833) SpO2:  [93 %-98 %] 93 % (08/17 0833) Weight:  [96.2 kg] 96.2 kg (08/17 0536) Last BM Date : 07/24/23 Mean arterial Pressure 100s  Intake/Output:   Intake/Output Summary (Last 24 hours) at 07/26/2023 1059 Last data filed at 07/26/2023 0545 Gross per 24 hour  Intake 626.07 ml  Output 0 ml  Net 626.07 ml      Physical Exam    General: Well appearing this am. NAD.  HEENT: Normal. Neck: Supple, JVP 7-8 cm. Carotids OK.  Cardiac:  Mechanical heart sounds with LVAD hum present.  Lungs:  CTAB, normal effort.  Abdomen:  Large abdominal wall hernia, nontender LVAD exit site: Well-healed and incorporated. Dressing dry and intact. No erythema or drainage. Stabilization device present and accurately applied. Driveline dressing changed daily per sterile technique. Extremities:  Warm and dry. No cyanosis, clubbing, rash, or edema.  Neuro:  Alert & oriented x 3. Cranial nerves grossly intact. Moves all 4 extremities w/o difficulty. Affect pleasant     Telemetry   SR   Labs   Basic Metabolic Panel: Recent Labs  Lab 07/22/23 0524 07/23/23 0731 07/24/23 0636 07/25/23 0714 07/26/23 0726  NA 134* 134* 135 137 140  K 4.4 4.2 4.2 4.0 3.7  CL 101 101 103 104 105  CO2 22 23 23 24 24   GLUCOSE 271* 195* 169* 195* 105*  BUN 34* 29* 29* 27* 25*  CREATININE 1.24 1.10 0.97 1.00 1.20  CALCIUM 9.1 9.2 9.1 9.0 8.8*  MG 1.9 1.9 2.0 2.0 1.9    Liver Function Tests: No results for input(s): "AST", "ALT", "ALKPHOS", "BILITOT", "PROT", "ALBUMIN" in the last 168 hours.  CBC: Recent Labs  Lab 07/22/23 0524 07/23/23 1154 07/24/23 0636 07/25/23 0714 07/26/23 0726  WBC 7.6 10.8* 7.8 6.3 4.9  HGB 10.4* 11.8* 11.6* 10.7* 9.7*  HCT 32.3* 36.1* 35.3* 33.0* 30.0*  MCV 85.7 87.0 85.3 88.0 86.0  PLT  163 207 188 164 164    INR: Recent Labs  Lab 07/22/23 0524 07/23/23 0731 07/24/23 0636 07/25/23 0714 07/26/23 0726  INR 1.9* 1.7* 1.7* 1.8* 1.8*    Other results:    Imaging   No results found.   Medications:     Scheduled Medications:  allopurinol  100 mg Oral Daily   amLODipine  10 mg Oral Daily   clonazepam  0.5 mg Oral BID   fluticasone furoate-vilanterol  1 puff Inhalation Daily   And   umeclidinium bromide  1 puff Inhalation Daily   gabapentin  300 mg Oral BID   insulin aspart  0-20 Units  Subcutaneous TID WC   insulin aspart  0-5 Units Subcutaneous QHS   insulin glargine-yfgn  14 Units Subcutaneous Daily   losartan  25 mg Oral Daily   metoprolol succinate  25 mg Oral Daily   minocycline  200 mg Oral BID   mirtazapine  15 mg Oral QHS   pantoprazole  40 mg Oral Daily   sertraline  50 mg Oral Daily   spironolactone  12.5 mg Oral Daily   tamsulosin  0.4 mg Oral Daily   thiamine  100 mg Oral Daily   traZODone  150 mg Oral QHS   warfarin  6 mg Oral q1600   Warfarin - Pharmacist Dosing Inpatient   Does not apply q1600   zinc sulfate  220 mg Oral Daily    Infusions:  ceFEPime (MAXIPIME) IV 2 g (07/26/23 0547)   DAPTOmycin (CUBICIN) 800 mg in sodium chloride 0.9 % IVPB 800 mg (07/25/23 1606)    PRN Medications: acetaminophen, fluticasone, HYDROcodone-acetaminophen, traMADol   Patient Profile  62 y.o. 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 Now s/p Heartmate 3 LVAD. Now admitted w/ suspected DL fracture after multiple falls and recurrent DL infection.  Assessment/Plan:    1.  LVAD Driveline infection: MRSA driveline infection, admitted in 6/23 and again in 7/23-8/23 with multiple trips to the OR for debridement.  Cultures have grown acinetobacter and MRSA. He was readmitted with driveline infection in 2/24, completed course of daptomycin for MRSA. Now readmitted w/ suspected DL fracture and DL infection. P/w "driveline communication fault" error message on LVAD.LVAD coordinator replaced modular cable and controller with resolution of driveline communication fault signal. CT C/A/P c/w prior imaging w/ similar appearance of mild circumferential thickening of the fat plane surrounding the drive line in the upper abdomen extending from the pump to the skin which may be chronic or  represent an infectious process. No drainable fluid collection.  - Started on empiric daptomycin/Unasyn per ID. Initial wound Cx + for Pseudomonas Aeruginosa (pan-sensitive) and S. aureus. Cefepime added. Now on Dapto, Cefepime and minocycline. BCx NGTD. Taken to OR 7/31 for wound debridement + application of wound vac. Returned to OR 8/7 for wound wash out/debridement - Continue abx per ID. Appreciate their assistance. ID recommends 4 weeks of daptomycin then Q 3 wks dalbavancin. Would have significant concern about him receiving IV abx at home with history of drug use and noncompliance. Would likely need to discharge to SNF if needs IV abx after discharge but he does not want to return to SNF. Plan to stay in hospital until completion of IV therapy on 8/23.   2. Suspected DL fracture with multiple recent falls -  Has had multiple falls during the last 2 months, one direct trauma to mid sternum (fell on cinder block) - CT chest/abd/pelvis per above  - CXR w/ LVAD partially imaged. The associated lead noted to be intact w/ no fracture or kink  - LVAD coordinators changed modular cable and controller with resolution  - No alarms   3.  Chronic systolic CHF s/p LVAD: Echo 10/21 with EF 20-25%, mildly decreased RV function. LHC/RHC in 12/21 with patent grafts, low output. Suspect mixed ischemic/nonischemic cardiomyopathy (prior heavy ETOH and drugs as well as CAD). Admitted with cardiogenic shock in 12/21, had placement of Impella 5.5 initially, now s/p Heartmate 3 LVAD on 11/17/20.  Ramp echo 2/23 with speed decreased to 5500 rpm. Speed now back to 5600 rpm.  MAP elevated 100s. He is not volume overloaded on exam. LVAD parameters stable.  - Continue current Toprol XL 25 mg daily.  - Continue losartan 25 mg daily.  - Add spironolactone 12.5 daily.  - INR 1.1 on admission. Suspect noncompliant with Warfarin. - INR 1.8 today. Dosing per PharmD   4. CKD stage 3: Prior CTA abdomen/pelvis with high-grade  stenosis of proximal right renal artery.  Creatinine stable 1.2 today.   5.  CAD: S/p CABG 12/20.  LHC pre-VAD with patent grafts, no target for intervention. - Continue Crestor 20 mg daily.   6.  Atrial fibrillation: Paroxysmal.  S/p Maze and LA appendage clip with CABG in 12/20.  Now in NSR.  - Continue Toprol XL 25 mg daily.  - Off amiodarone with hyperthyroidism.  7. Type 2 diabetes: improved control, Hgb A1c down from 12.1>>6.3  - continue SSI   8. 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 He says last use of methamphetamine was > 2 months ago. - UDS this admit negative (however collected > 48 hrs post admit)  - Declines substance use resources  9. 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  Followed by endocrinology. He is now off amiodarone. He had been on methimazole but has not been taking recently. TSH was high on admit and free T4 normal  - Will NOT restart methimazole   10. Anemia (chronic blood loss):  Hgb stable.   11. COPD: Prior smoker.  - Continue Trelegy.   12. PVCs/NSVT:  - Now off amiodarone.   13.  Depression: Has turned to substance abuse to cope.   - Has been referred to OP psych in [place]   14.  Gout: Continue allopurinol, he has prn colchicine.   15.  HTN: MAPs 100s  in the setting of known Rt RAS.  - monitor closely, if remains elevated may need reassessment of renal artery stenosis and possible treatment. Significant calcifications in both renal arteries on CT abdomen this admit. - Continue Amlodipine 10 mg + losartan 25 mg daily.  - Add spironolactone 12.5 daily.   16. Agitation: Klonopin increased, agitation improved  17. Incisional hernia: Large abdominal wall hernia in close proximity to driveline. Cannot tolerate wearing abdominal binder.  Has chronic discomfort from the hernia and the driveline.  Upset today because Norco is not helping.  - Can try oxycodone at low dose. Has substance abuse history but really  pretty miserable in hospital currently.   I reviewed the LVAD parameters from today, and compared the results to the patient's prior recorded data.  No programming changes were made.  The LVAD is functioning within specified parameters.  The patient performs LVAD self-test daily.  LVAD interrogation was negative for any  significant power changes, alarms or PI events/speed drops.  LVAD equipment check completed and is in good working order.  Back-up equipment present.   LVAD education done on emergency procedures and precautions and reviewed exit site care.  Length of Stay: 108  Marca Ancona, MD 07/26/2023, 10:59 AM  VAD Team --- VAD ISSUES ONLY--- Pager [phone] (7am - 7am)  Advanced Heart Failure Team  Pager [phone] (M-F; 7a - 5p)  Please contact [facility] for night-coverage after hours (5p -7a ) and weekends on [email or web address]   Patient seen and examined with the above-signed Advanced Practice Provider and/or Housestaff. I personally reviewed laboratory data, imaging studies and relevant notes. I independently examined the patient and formulated the important aspects of the plan. I have edited the note to reflect any of my changes or salient points. I have personally discussed the plan with the patient and/or family.

## 2023-07-26 NOTE — Progress Notes (Signed)
ANTICOAGULATION CONSULT NOTE  Pharmacy Consult for warfarin  Indication:  LVAD  Allergies  Allergen Reactions   Vancomycin Nausea And Vomiting and Other (See Comments)    Severe joint pain    Patient Measurements: Height: 5\' 10"  (177.8 cm) Weight: 96.2 kg (212 lb 1.3 oz) IBW/kg (Calculated) : 73 Heparin Dosing Weight: 91.4 kg   Vital Signs: Temp: 97.8 F (36.6 C) (08/17 1121) Temp Source: Oral (08/17 1121) BP: 138/103 (08/17 1121) Pulse Rate: 77 (08/17 1121)  Labs: Recent Labs    07/24/23 0636 07/25/23 0714 07/26/23 0726  HGB 11.6* 10.7* 9.7*  HCT 35.3* 33.0* 30.0*  PLT 188 164 164  LABPROT 20.1* 20.7* 21.3*  INR 1.7* 1.8* 1.8*  CREATININE 0.97 1.00 1.20    Estimated Creatinine Clearance: 74.3 mL/min (by C-G formula based on SCr of 1.2 mg/dL).   Medical History: Past Medical History:  Diagnosis Date   "    Arthritis    CAD (coronary artery disease)    a. s/p CABG in 11/2019 with LIMA-LAD, SVG-OM1, SVG-PDA and SVG-D1   CHF (congestive heart failure) (HCC)    a. EF < 20% by echo in 11/2019   Essential hypertension    PAF (paroxysmal atrial fibrillation) (HCC)    Type 2 diabetes mellitus (HCC)     Medications:  Scheduled:   allopurinol  100 mg Oral Daily   amLODipine  10 mg Oral Daily   clonazepam  0.5 mg Oral BID   fluticasone furoate-vilanterol  1 puff Inhalation Daily   And   umeclidinium bromide  1 puff Inhalation Daily   gabapentin  300 mg Oral BID   insulin aspart  0-20 Units Subcutaneous TID WC   insulin aspart  0-5 Units Subcutaneous QHS   insulin glargine-yfgn  14 Units Subcutaneous Daily   losartan  25 mg Oral Daily   metoprolol succinate  25 mg Oral Daily   minocycline  200 mg Oral BID   mirtazapine  15 mg Oral QHS   pantoprazole  40 mg Oral Daily   sertraline  50 mg Oral Daily   spironolactone  12.5 mg Oral Daily   tamsulosin  0.4 mg Oral Daily   thiamine  100 mg Oral Daily   traZODone  150 mg Oral QHS   warfarin  6 mg Oral q1600    Warfarin - Pharmacist Dosing Inpatient   Does not apply q1600   zinc sulfate  220 mg Oral Daily    Assessment: 62 yom who presented to clinic with concern for driveline fracture and infection. Last [facility] appointment was on 04/21/2023 - INR was subtherapeutic at 1.4 and regimen at that time was 6 mg daily except 4 mg Tues/Thurs (LD 7/25).  Given low INR on admit, started fixed rate heparin 500 units/hr - now stopped. INR is slightly sub-therapeutic at 1.8 after doses of 6 > 6 > 4 > 6 > 6 > 6. HGB stable 9-11, pltc stable 168. LDH stable 150-200. No s/sx of bleeding. No further debridements.  Goal of Therapy:  INR 2-2.5 Monitor platelets by anticoagulation protocol: Yes   Plan:  Warfarin 6mg  daily Monitor daily INR, CBC, LDH, and for s/sx of bleeding    Wilmer Floor, PharmD PGY2 Cardiology Pharmacy Resident [phone] 07/26/2023 4:03 PM   Please check AMION for all [facility] Pharmacy phone numbers After 10:00 PM, call Main Pharmacy [phone]

## 2023-07-27 DIAGNOSIS — T827XXA Infection and inflammatory reaction due to other cardiac and vascular devices, implants and grafts, initial encounter: Secondary | ICD-10-CM | POA: Diagnosis not present

## 2023-07-27 LAB — BASIC METABOLIC PANEL
Anion gap: 10 (ref 5–15)
BUN: 19 mg/dL (ref 8–23)
CO2: 23 mmol/L (ref 22–32)
Calcium: 8.7 mg/dL — ABNORMAL LOW (ref 8.9–10.3)
Chloride: 102 mmol/L (ref 98–111)
Creatinine, Ser: 1.08 mg/dL (ref 0.61–1.24)
GFR, Estimated: >60 mL/min (ref >60)
Glucose, Bld: 158 mg/dL — ABNORMAL HIGH (ref 70–99)
Potassium: 3.9 mmol/L (ref 3.5–5.1)
Sodium: 135 mmol/L (ref 135–145)

## 2023-07-27 LAB — MAGNESIUM: Magnesium: 1.8 mg/dL (ref 1.7–2.4)

## 2023-07-27 LAB — CBC
HCT: 31 % — ABNORMAL LOW (ref 39.0–52.0)
Hemoglobin: 10 g/dL — ABNORMAL LOW (ref 13.0–17.0)
MCH: 27.9 pg (ref 26.0–34.0)
MCHC: 32.3 g/dL (ref 30.0–36.0)
MCV: 86.4 fL (ref 80.0–100.0)
Platelets: 157 10*3/uL (ref 150–400)
RBC: 3.59 MIL/uL — ABNORMAL LOW (ref 4.22–5.81)
RDW: 14.7 % (ref 11.5–15.5)
WBC: 5.8 10*3/uL (ref 4.0–10.5)
nRBC: 0 % (ref 0.0–0.2)

## 2023-07-27 LAB — PROTIME-INR
INR: 2 — ABNORMAL HIGH (ref 0.8–1.2)
Prothrombin Time: 22.5 s — ABNORMAL HIGH (ref 11.4–15.2)

## 2023-07-27 LAB — GLUCOSE, CAPILLARY
Glucose-Capillary: 148 mg/dL — ABNORMAL HIGH (ref 70–99)
Glucose-Capillary: 184 mg/dL — ABNORMAL HIGH (ref 70–99)
Glucose-Capillary: 212 mg/dL — ABNORMAL HIGH (ref 70–99)
Glucose-Capillary: 113 mg/dL — ABNORMAL HIGH (ref 70–99)

## 2023-07-27 LAB — LACTATE DEHYDROGENASE: LDH: 176 U/L (ref 98–192)

## 2023-07-27 MED ORDER — SPIRONOLACTONE 12.5 MG HALF TABLET
12.5000 mg | ORAL_TABLET | Freq: Once | ORAL | Status: AC
  Administered 2023-07-27: 12.5 mg via ORAL
  Filled 2023-07-27: qty 1

## 2023-07-27 MED ORDER — SPIRONOLACTONE 25 MG PO TABS
25.0000 mg | ORAL_TABLET | Freq: Every day | ORAL | Status: DC
  Administered 2023-07-28 – 2023-08-01 (×5): 25 mg via ORAL
  Filled 2023-07-27 (×5): qty 1

## 2023-07-27 NOTE — Progress Notes (Signed)
ANTICOAGULATION CONSULT NOTE  Pharmacy Consult for warfarin  Indication:  LVAD  Allergies  Allergen Reactions   Vancomycin Nausea And Vomiting and Other (See Comments)    Severe joint pain    Patient Measurements: Height: 5\' 10"  (177.8 cm) Weight: 97.4 kg (214 lb 11.7 oz) IBW/kg (Calculated) : 73 Heparin Dosing Weight: 91.4 kg   Vital Signs: Temp: 98.1 F (36.7 C) (08/18 0829) Temp Source: Oral (08/18 0829) BP: 119/96 (08/18 0829) Pulse Rate: 80 (08/18 0829)  Labs: Recent Labs    07/25/23 0714 07/26/23 0726 07/27/23 0724  HGB 10.7* 9.7* 10.0*  HCT 33.0* 30.0* 31.0*  PLT 164 164 157  LABPROT 20.7* 21.3* 22.5*  INR 1.8* 1.8* 2.0*  CREATININE 1.00 1.20 1.08    Estimated Creatinine Clearance: 83.1 mL/min (by C-G formula based on SCr of 1.08 mg/dL).   Medical History: Past Medical History:  Diagnosis Date   "    Arthritis    CAD (coronary artery disease)    a. s/p CABG in 11/2019 with LIMA-LAD, SVG-OM1, SVG-PDA and SVG-D1   CHF (congestive heart failure) (HCC)    a. EF < 20% by echo in 11/2019   Essential hypertension    PAF (paroxysmal atrial fibrillation) (HCC)    Type 2 diabetes mellitus (HCC)     Medications:  Scheduled:   allopurinol  100 mg Oral Daily   amLODipine  10 mg Oral Daily   clonazepam  0.5 mg Oral BID   fluticasone furoate-vilanterol  1 puff Inhalation Daily   And   umeclidinium bromide  1 puff Inhalation Daily   gabapentin  300 mg Oral BID   insulin aspart  0-20 Units Subcutaneous TID WC   insulin aspart  0-5 Units Subcutaneous QHS   insulin glargine-yfgn  14 Units Subcutaneous Daily   losartan  25 mg Oral Daily   metoprolol succinate  25 mg Oral Daily   minocycline  200 mg Oral BID   mirtazapine  15 mg Oral QHS   pantoprazole  40 mg Oral Daily   sertraline  50 mg Oral Daily   spironolactone  12.5 mg Oral Once   [START ON 07/28/2023] spironolactone  25 mg Oral Daily   tamsulosin  0.4 mg Oral Daily   thiamine  100 mg Oral Daily    traZODone  150 mg Oral QHS   warfarin  6 mg Oral q1600   Warfarin - Pharmacist Dosing Inpatient   Does not apply q1600   zinc sulfate  220 mg Oral Daily    Assessment: 62 yom who presented to clinic with concern for driveline fracture and infection. Last [facility] appointment was on 04/21/2023 - INR was subtherapeutic at 1.4 and regimen at that time was 6 mg daily except 4 mg Tues/Thurs (LD 7/25).  Given low INR on admit, started fixed rate heparin 500 units/hr - now stopped. INR is therapeutic at 2.0 after doses of 6 > 6 > 4 > 6 > 6 > 6 > 6. HGB stable 9-11, pltc stable 140-160. LDH stable 150-200. No s/sx of bleeding. No further debridements.  Goal of Therapy:  INR 2-2.5 Monitor platelets by anticoagulation protocol: Yes   Plan:  Continue warfarin 6mg  daily for now. If jump in INR again tomorrow, then consider reducing dose. Monitor daily INR, CBC, LDH, and for s/sx of bleeding    Wilmer Floor, PharmD PGY2 Cardiology Pharmacy Resident [phone] 07/27/2023 11:09 AM   Please check AMION for all [facility] Pharmacy phone numbers After 10:00 PM, call Main  Pharmacy [phone]

## 2023-07-27 NOTE — Plan of Care (Signed)
  Problem: Education: Goal: Patient will understand all VAD equipment and how it functions Outcome: Progressing Goal: Patient will be able to verbalize current INR target range and antiplatelet therapy for discharge home Outcome: Progressing   Problem: Cardiac: Goal: LVAD will function as expected and patient will experience no clinical alarms Outcome: Progressing   Problem: Education: Goal: Knowledge of General Education information will improve Description: Including pain rating scale, medication(s)/side effects and non-pharmacologic comfort measures Outcome: Progressing   

## 2023-07-27 NOTE — Progress Notes (Signed)
Patient ID: Kevin Underwood., male   DOB: November 14, 1961, 62 y.o.   MRN: [id number]   Advanced Heart Failure VAD Team Note  PCP-Cardiologist: Nona Dell, MD  [facility]: Dr. Shirlee Latch   Subjective:    7/29: admitted w/ suspected DL fracture and DL infection. P/w "driveline communication fault" error message on LVAD. No pump stoppages. LVAD coordinator replaced modular cable and controller with resolution of driveline communication fault signal. CT C/A/P c/w prior imaging w/ similar appearance of mild circumferential thickening of the fat plane surrounding the drive line in the upper abdomen extending from the pump to the skin which may be chronic or represent an infectious process. No drainable fluid collection. Wound + BCx collected. Started on empiric daptomycin/Unasyn per ID.  7/30: Wound Cx growing Pseudomonas Aeruginosa (pan-sensitive) and S. aureus. Cefepime added 7/31: OR for wound debridement + application of wound vac.   8/8: Returned to OR for I&D 8/11: Losartan increased to 25 mg daily  Remains on Daptomycin, Cefepime and minocycline. Surgical wound with pseudomonas aeruginosa and MRSA. Bcx from 7/29 and 7/31 NGTD.  MAP elevated 90s-100s.   Has nagging pain below his large abdominal wall hernia, has been getting oxycodone with relief. Less agitated today.   LVAD INTERROGATION:  HeartMate III LVAD:   Flow 4.4 liters/min, speed 5600, power 4, PI  2.8.   Objective:    Vital Signs:   Temp:  [97.8 F (36.6 C)-98.4 F (36.9 C)] 98.1 F (36.7 C) (08/18 0829) Pulse Rate:  [77-82] 80 (08/18 0829) Resp:  [12-18] 12 (08/18 0829) BP: (115-138)/(82-103) 119/96 (08/18 0829) SpO2:  [90 %-96 %] 90 % (08/18 0829) Weight:  [97.4 kg] 97.4 kg (08/18 0604) Last BM Date : 07/24/23 Mean arterial Pressure 90s-100s  Intake/Output:   Intake/Output Summary (Last 24 hours) at 07/27/2023 1021 Last data filed at 07/27/2023 0608 Gross per 24 hour  Intake 946.06 ml  Output --  Net 946.06 ml       Physical Exam   General: Well appearing this am. NAD.  HEENT: Normal. Neck: Supple, JVP 7-8 cm. Carotids OK.  Cardiac:  Mechanical heart sounds with LVAD hum present.  Lungs:  CTAB, normal effort.  Abdomen:  NT, ND, no HSM. No bruits or masses. +BS. Large abdominal wall hernia.  LVAD exit site: Dressing present.  Extremities:  Warm and dry. No cyanosis, clubbing, rash, or edema.  Neuro:  Alert & oriented x 3. Cranial nerves grossly intact. Moves all 4 extremities w/o difficulty. Affect pleasant    Telemetry   SR   Labs   Basic Metabolic Panel: Recent Labs  Lab 07/23/23 0731 07/24/23 0636 07/25/23 0714 07/26/23 0726 07/27/23 0724  NA 134* 135 137 140 135  K 4.2 4.2 4.0 3.7 3.9  CL 101 103 104 105 102  CO2 23 23 24 24 23   GLUCOSE 195* 169* 195* 105* 158*  BUN 29* 29* 27* 25* 19  CREATININE 1.10 0.97 1.00 1.20 1.08  CALCIUM 9.2 9.1 9.0 8.8* 8.7*  MG 1.9 2.0 2.0 1.9 1.8    Liver Function Tests: No results for input(s): "AST", "ALT", "ALKPHOS", "BILITOT", "PROT", "ALBUMIN" in the last 168 hours.  CBC: Recent Labs  Lab 07/23/23 1154 07/24/23 0636 07/25/23 0714 07/26/23 0726 07/27/23 0724  WBC 10.8* 7.8 6.3 4.9 5.8  HGB 11.8* 11.6* 10.7* 9.7* 10.0*  HCT 36.1* 35.3* 33.0* 30.0* 31.0*  MCV 87.0 85.3 88.0 86.0 86.4  PLT 207 188 164 164 157    INR: Recent Labs  Lab  07/23/23 0731 07/24/23 0636 07/25/23 0714 07/26/23 0726 07/27/23 0724  INR 1.7* 1.7* 1.8* 1.8* 2.0*    Other results:    Imaging   No results found.   Medications:     Scheduled Medications:  allopurinol  100 mg Oral Daily   amLODipine  10 mg Oral Daily   clonazepam  0.5 mg Oral BID   fluticasone furoate-vilanterol  1 puff Inhalation Daily   And   umeclidinium bromide  1 puff Inhalation Daily   gabapentin  300 mg Oral BID   insulin aspart  0-20 Units Subcutaneous TID WC   insulin aspart  0-5 Units Subcutaneous QHS   insulin glargine-yfgn  14 Units Subcutaneous Daily    losartan  25 mg Oral Daily   metoprolol succinate  25 mg Oral Daily   minocycline  200 mg Oral BID   mirtazapine  15 mg Oral QHS   pantoprazole  40 mg Oral Daily   sertraline  50 mg Oral Daily   spironolactone  12.5 mg Oral Once   [START ON 07/28/2023] spironolactone  25 mg Oral Daily   tamsulosin  0.4 mg Oral Daily   thiamine  100 mg Oral Daily   traZODone  150 mg Oral QHS   warfarin  6 mg Oral q1600   Warfarin - Pharmacist Dosing Inpatient   Does not apply q1600   zinc sulfate  220 mg Oral Daily    Infusions:  ceFEPime (MAXIPIME) IV 2 g (07/27/23 1610)   DAPTOmycin (CUBICIN) 800 mg in sodium chloride 0.9 % IVPB 800 mg (07/26/23 1459)    PRN Medications: acetaminophen, fluticasone, oxyCODONE, traMADol   Patient Profile  62 y.o. 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 Now s/p Heartmate 3 LVAD. Now admitted w/ suspected DL fracture after multiple falls and recurrent DL infection.  Assessment/Plan:    1.  LVAD Driveline infection: MRSA driveline infection, admitted in 6/23 and again in 7/23-8/23 with multiple trips to the OR for debridement.  Cultures have grown acinetobacter and MRSA. He was readmitted with driveline infection in 2/24, completed course of daptomycin for MRSA. Now readmitted w/ suspected DL fracture and DL infection. P/w "driveline communication fault" error message on LVAD.LVAD coordinator replaced modular cable and controller with resolution of driveline communication fault signal. CT C/A/P c/w prior imaging w/ similar appearance of mild circumferential thickening of the fat plane surrounding the drive line in the upper abdomen extending from the pump to the skin which may be chronic or represent an infectious process. No drainable fluid collection.  - Started on empiric  daptomycin/Unasyn per ID. Initial wound Cx + for Pseudomonas Aeruginosa (pan-sensitive) and S. aureus. Cefepime added. Now on Dapto, Cefepime and minocycline. BCx NGTD. Taken to OR 7/31 for wound debridement + application of wound vac. Returned to OR 8/7 for wound wash out/debridement - Continue abx per ID. Appreciate their assistance. ID recommends 4 weeks of daptomycin then Q 3 wks dalbavancin. Would have significant concern about him receiving IV abx at home with history of drug use and noncompliance. Would likely need to discharge to SNF if needs IV abx after discharge but he does not want to return to SNF. Plan currently is to stay in hospital until completion of IV therapy on 8/23.   2. Suspected DL fracture with multiple recent falls -  Has had multiple falls during the last 2 months, one direct trauma to mid sternum (fell on cinder block) - CT chest/abd/pelvis per above  - CXR w/ LVAD partially imaged. The associated lead noted to be intact w/ no fracture or kink  - LVAD coordinators changed modular cable and controller with resolution  - No alarms   3.  Chronic systolic CHF s/p LVAD: Echo 10/21 with EF 20-25%, mildly decreased RV function. LHC/RHC in 12/21 with patent grafts, low output. Suspect mixed ischemic/nonischemic cardiomyopathy (prior heavy ETOH and drugs as well as CAD). Admitted with cardiogenic shock in 12/21, had placement of Impella 5.5 initially, now s/p Heartmate 3 LVAD on 11/17/20.  Ramp echo 2/23 with speed decreased to 5500 rpm. Speed now back to 5600 rpm.  MAP elevated 90s-100s. He is not volume overloaded on exam. LVAD parameters stable.  - Continue current Toprol XL 25 mg daily.  - Continue losartan 25 mg daily.  - Increase spironolactone to 25 mg daily.   - INR 1.1 on admission. Suspect noncompliant with Warfarin. - INR 2 today. Dosing per PharmD   4. CKD stage 3: Prior CTA abdomen/pelvis with high-grade stenosis of proximal right renal artery.  Creatinine stable 1.08  today.   5.  CAD: S/p CABG 12/20.  LHC pre-VAD with patent grafts, no target for intervention. - Continue Crestor 20 mg daily.   6.  Atrial fibrillation: Paroxysmal.  S/p Maze and LA appendage clip with CABG in 12/20.  Now in NSR.  - Continue Toprol XL 25 mg daily.  - Off amiodarone with hyperthyroidism.  7. Type 2 diabetes: improved control, Hgb A1c down from 12.1>>6.3  - continue SSI   8. 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 He says last use of methamphetamine was > 2 months ago. - UDS this admit negative (however collected > 48 hrs post admit)  - Declines substance use resources  9. 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  Followed by endocrinology. He is now off amiodarone. He had been on methimazole but has not been taking recently. TSH was high on admit and free T4 normal  - Will NOT restart methimazole   10. Anemia (chronic blood loss):  Hgb stable.   11. COPD: Prior smoker.  - Continue Trelegy.   12. PVCs/NSVT:  - Now off amiodarone.   13.  Depression: Has turned to substance abuse to cope.   - Has been referred to OP psych in [place]   14.  Gout: Continue allopurinol, he has prn colchicine.   15.  HTN: MAPs 90s-100s  in the setting of known Rt RAS.  - monitor closely, if remains elevated may need reassessment of renal artery stenosis and possible treatment. Significant calcifications in both renal arteries on CT abdomen this admit. - Continue Amlodipine 10 mg + losartan 25 mg daily.  - Increase spironolactone to 25 mg daily.   16. Agitation: Klonopin increased, agitation improved  17. Incisional hernia: Large abdominal wall hernia in close proximity to driveline. Cannot tolerate wearing abdominal binder.  Has chronic discomfort from the hernia and the driveline.  It is not incarcerated.  - Can try oxycodone at low dose. Has substance abuse history but really pretty miserable in hospital currently.  - No good option for hernia  repair given driveline proximity.   I reviewed the LVAD parameters from today, and compared the results to the patient's prior recorded data.  No programming changes were made.  The LVAD is functioning within specified parameters.  The patient performs LVAD self-test daily.  LVAD interrogation was negative for any significant power changes, alarms or PI events/speed drops.  LVAD equipment check completed and is in good working order.  Back-up equipment present.   LVAD education done on emergency procedures and precautions and reviewed exit site care.  Length of Stay: 20  Marca Ancona, MD 07/27/2023, 10:21 AM  VAD Team --- VAD ISSUES ONLY--- Pager [phone] (7am - 7am)  Advanced Heart Failure Team  Pager [phone] (M-F; 7a - 5p)  Please contact [facility] for night-coverage after hours (5p -7a ) and weekends on [email or web address]   Patient seen and examined with the above-signed Advanced Practice Provider and/or Housestaff. I personally reviewed laboratory data, imaging studies and relevant notes. I independently examined the patient and formulated the important aspects of the plan. I have edited the note to reflect any of my changes or salient points. I have personally discussed the plan with the patient and/or family.

## 2023-07-28 ENCOUNTER — Encounter (HOSPITAL_COMMUNITY): Payer: Self-pay | Admitting: Cardiothoracic Surgery

## 2023-07-28 DIAGNOSIS — T827XXA Infection and inflammatory reaction due to other cardiac and vascular devices, implants and grafts, initial encounter: Secondary | ICD-10-CM | POA: Diagnosis not present

## 2023-07-28 LAB — PROTIME-INR
INR: 1.9 — ABNORMAL HIGH (ref 0.8–1.2)
Prothrombin Time: 22.2 s — ABNORMAL HIGH (ref 11.4–15.2)

## 2023-07-28 LAB — GLUCOSE, CAPILLARY
Glucose-Capillary: 217 mg/dL — ABNORMAL HIGH (ref 70–99)
Glucose-Capillary: 131 mg/dL — ABNORMAL HIGH (ref 70–99)
Glucose-Capillary: 190 mg/dL — ABNORMAL HIGH (ref 70–99)
Glucose-Capillary: 195 mg/dL — ABNORMAL HIGH (ref 70–99)

## 2023-07-28 LAB — CBC
HCT: 31.8 % — ABNORMAL LOW (ref 39.0–52.0)
Hemoglobin: 10.2 g/dL — ABNORMAL LOW (ref 13.0–17.0)
MCH: 27.3 pg (ref 26.0–34.0)
MCHC: 32.1 g/dL (ref 30.0–36.0)
MCV: 85.3 fL (ref 80.0–100.0)
Platelets: 163 10*3/uL (ref 150–400)
RBC: 3.73 MIL/uL — ABNORMAL LOW (ref 4.22–5.81)
RDW: 14.7 % (ref 11.5–15.5)
WBC: 5.9 10*3/uL (ref 4.0–10.5)
nRBC: 0 % (ref 0.0–0.2)

## 2023-07-28 LAB — BASIC METABOLIC PANEL
Anion gap: 8 (ref 5–15)
BUN: 18 mg/dL (ref 8–23)
CO2: 27 mmol/L (ref 22–32)
Calcium: 9 mg/dL (ref 8.9–10.3)
Chloride: 101 mmol/L (ref 98–111)
Creatinine, Ser: 1.05 mg/dL (ref 0.61–1.24)
GFR, Estimated: >60 mL/min (ref >60)
Glucose, Bld: 192 mg/dL — ABNORMAL HIGH (ref 70–99)
Potassium: 3.9 mmol/L (ref 3.5–5.1)
Sodium: 136 mmol/L (ref 135–145)

## 2023-07-28 LAB — MAGNESIUM: Magnesium: 1.7 mg/dL (ref 1.7–2.4)

## 2023-07-28 LAB — LACTATE DEHYDROGENASE: LDH: 163 U/L (ref 98–192)

## 2023-07-28 MED ORDER — LOSARTAN POTASSIUM 50 MG PO TABS
50.0000 mg | ORAL_TABLET | Freq: Every day | ORAL | Status: DC
  Administered 2023-07-29 – 2023-08-01 (×4): 50 mg via ORAL
  Filled 2023-07-28 (×4): qty 1

## 2023-07-28 MED ORDER — LOSARTAN POTASSIUM 25 MG PO TABS
25.0000 mg | ORAL_TABLET | Freq: Once | ORAL | Status: AC
  Administered 2023-07-28: 25 mg via ORAL
  Filled 2023-07-28: qty 1

## 2023-07-28 NOTE — Progress Notes (Signed)
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 Started on IV antibiotics.   Patient presented to VAD clinic 7/29 for driveline communication fault- stating that he has had "alarms every couple hrs from his VAD for a couple months." Pt has no showed and cancelled all appointments in the past 2 months. Pt also had green/blue drainage visible through his driveline bandage. (Dressing change and wound cx obtained). Pt states that he has experienced dizziness associated with a fall multiple times over the past couple months but no signs of bleeding. Controller and modular cable exchanged. No further communication fault alarms noted.   CT Chest/Abd/Pelvis 7/29 1. Similar appearance of mild circumferential thickening of the fat plane surrounding the drive line in the upper abdomen extending from the pump to the skin which may be chronic or represent an infectious process. No drainable fluid collection.  Pt had drive line debridement in OR with placement of instillation wound vac on 07/09/23 with Dr Donata Clay. This was change to wet/dry on Friday 8/2 due to ongoing alarms related to the pts hernia.   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 Currently on IV Cefepime Q 8 hrs and IV Daptomycin 800 mg daily per ID team. ID team is recommending 4 weeks IV antibiotics. Pt is refusing to go a facility for IV antibiotics.   Laying in bed.   Will plan to keep hospitalized while completing IV antibiotics.   Vital signs: Temp: 97.9 HR: 81 Doppler Pressure: 100 Automatic BP: 133/91 (102) O2 Sat: 96% on RA Wt: 193.3>194.8>196.4>194.7>...>205.5>206.6>207.8>209.2>211.4 lbs  LVAD interrogation reveals:  Speed: 5600 Flow: 4.1 Power: 4.2 w PI: 4.3 Alarms: none Events: 20 PI today Hct: 32  Fixed speed:  5600 Low speed limit:  5300  Drive Line: Existing VAD dressing removed and site  care performed using sterile technique. Wound bed cleansed with VASHE solution and sterile 4x4 gauze through light manual debridement. Skin surrounding wound bed cleaned with Chlora prep applicators x 2, allowed to dry. VASHE moistened 4 x 4 placed in wound bed, covered with several dry 4 x 4s. Wound tunnels approx 2 cm.  Drive line unincorporated. Moderate amount of thick yellow drainage noted. No redness, tenderness, foul odor or rash noted. There is a skin tear from the tegaderm.  Drive line anchor secured.   Continue daily wet to dry dressing changes using VASHE solution per bedside RN or VAD coordinator. Next dressing change due 07/29/23 by bedside nurse.     Labs:  LDH trend: 121>133>130>138>172>175>165>158>169>155>170>167>173>161>163  INR trend: 1.0>1.1>1.2>1.4>1.3>1.4>1.5>1.4>1.8>1.9>1.7>1.8>1.9  Anticoagulation Plan: -INR Goal: 2.0 - 2.5 -ASA Dose: none  ICD: N/A  Infection:  07/07/23>> blood cultures>> no growth 5 days; final  07/07/23>> wound cx>> few pseudomonas aeruginosa and MRSA; final  07/09/23>> OR driveline cx>> Few pseudomonas aeruginosa and METHICILLIN RESISTANT STAPHYLOCOCCUS AUREUS 07/09/23>> blood cultures>>no growth x 5 days; final  Drips:    Plan/Recommendations:  Page VAD coordinator with equipment issues or driveline problems Daily drive line wet to dry dressing changes with VASHE solution.   Carlton Adam RN VAD Coordinator  Office: [phone]  24/7 Pager: [phone]

## 2023-07-28 NOTE — Plan of Care (Signed)
  Problem: Education: Goal: Patient will understand all VAD equipment and how it functions Outcome: Progressing Goal: Patient will be able to verbalize current INR target range and antiplatelet therapy for discharge home Outcome: Progressing   Problem: Cardiac: Goal: LVAD will function as expected and patient will experience no clinical alarms Outcome: Progressing   Problem: Education: Goal: Knowledge of General Education information will improve Description: Including pain rating scale, medication(s)/side effects and non-pharmacologic comfort measures Outcome: Progressing   Problem: Health Behavior/Discharge Planning: Goal: Ability to manage health-related needs will improve Outcome: Progressing   Problem: Clinical Measurements: Goal: Ability to maintain clinical measurements within normal limits will improve Outcome: Progressing Goal: Will remain free from infection Outcome: Progressing Goal: Diagnostic test results will improve Outcome: Progressing Goal: Respiratory complications will improve Outcome: Progressing Goal: Cardiovascular complication will be avoided Outcome: Progressing   Problem: Activity: Goal: Risk for activity intolerance will decrease Outcome: Progressing   Problem: Nutrition: Goal: Adequate nutrition will be maintained Outcome: Progressing   Problem: Coping: Goal: Level of anxiety will decrease Outcome: Progressing   Problem: Elimination: Goal: Will not experience complications related to bowel motility Outcome: Progressing Goal: Will not experience complications related to urinary retention Outcome: Progressing   Problem: Pain Managment: Goal: General experience of comfort will improve Outcome: Progressing   Problem: Safety: Goal: Ability to remain free from injury will improve Outcome: Progressing   Problem: Skin Integrity: Goal: Risk for impaired skin integrity will decrease Outcome: Progressing   Problem: Education: Goal: Ability  to describe self-care measures that may prevent or decrease complications (Diabetes Survival Skills Education) will improve Outcome: Progressing Goal: Individualized Educational Video(s) Outcome: Progressing   Problem: Coping: Goal: Ability to adjust to condition or change in health will improve Outcome: Progressing   Problem: Fluid Volume: Goal: Ability to maintain a balanced intake and output will improve Outcome: Progressing   Problem: Health Behavior/Discharge Planning: Goal: Ability to identify and utilize available resources and services will improve Outcome: Progressing Goal: Ability to manage health-related needs will improve Outcome: Progressing   Problem: Metabolic: Goal: Ability to maintain appropriate glucose levels will improve Outcome: Progressing   Problem: Nutritional: Goal: Maintenance of adequate nutrition will improve Outcome: Progressing Goal: Progress toward achieving an optimal weight will improve Outcome: Progressing   Problem: Skin Integrity: Goal: Risk for impaired skin integrity will decrease Outcome: Progressing   Problem: Tissue Perfusion: Goal: Adequacy of tissue perfusion will improve Outcome: Progressing   

## 2023-07-28 NOTE — Progress Notes (Signed)
Patient ID: Kevin Underwood., male   DOB: 27-Jan-1961, 62 y.o.   MRN: [id number]   Advanced Heart Failure VAD Team Note  PCP-Cardiologist: Nona Dell, MD  [facility]: Dr. Shirlee Latch   Subjective:    7/29: admitted w/ suspected DL fracture and DL infection. P/w "driveline communication fault" error message on LVAD. No pump stoppages. LVAD coordinator replaced modular cable and controller with resolution of driveline communication fault signal. CT C/A/P c/w prior imaging w/ similar appearance of mild circumferential thickening of the fat plane surrounding the drive line in the upper abdomen extending from the pump to the skin which may be chronic or represent an infectious process. No drainable fluid collection. Wound + BCx collected. Started on empiric daptomycin/Unasyn per ID.  7/30: Wound Cx growing Pseudomonas Aeruginosa (pan-sensitive) and S. aureus. Cefepime added 7/31: OR for wound debridement + application of wound vac.   8/8: Returned to OR for I&D 8/11: Losartan increased to 25 mg daily  Remains on Daptomycin, Cefepime and minocycline. Surgical wound with pseudomonas aeruginosa and MRSA. Bcx from 7/29 and 7/31 NGTD. Plan to complete IV abx 8/23  MAP elevated 100s-110s.   Feels fine, unable to ambulate much with abd pain. Wife at bedside.   LVAD INTERROGATION:  HeartMate III LVAD:   Flow 4.2 liters/min, speed 5600, power 4.2, PI  2.9. Off wall monitor, unable to fully interrogate device  Objective:    Vital Signs:   Temp:  [97.8 F (36.6 C)-98.9 F (37.2 C)] 97.8 F (36.6 C) (08/19 0515) Pulse Rate:  [80-85] 80 (08/18 2312) Resp:  [12-18] 12 (08/19 0746) BP: (95-133)/(81-99) 106/86 (08/19 0746) SpO2:  [90 %-95 %] 95 % (08/19 0746) Weight:  [95.9 kg] 95.9 kg (08/19 0400) Last BM Date : 07/27/23 Mean arterial Pressure 100s-110s Intake/Output:   Intake/Output Summary (Last 24 hours) at 07/28/2023 0933 Last data filed at 07/27/2023 2027 Gross per 24 hour  Intake 360 ml   Output --  Net 360 ml    Physical Exam  General:  Well appearing. No resp difficulty HEENT: Normal Neck: supple. JVP flat. Carotids 2+ bilat; no bruits. No lymphadenopathy or thyromegaly appreciated. Cor: Mechanical heart sounds with LVAD hum present. Lungs: Clear Abdomen: soft, nontender, nondistended. No hepatosplenomegaly. No bruits or masses. Good bowel sounds. Large abdominal hernia Driveline: C/D/I; securement device intact and driveline incorporated Extremities: no cyanosis, clubbing, rash, edema Neuro: alert & orientedx3, cranial nerves grossly intact. moves all 4 extremities w/o difficulty. Affect pleasant   Telemetry   NSR 90s (Personally reviewed)    Labs   Basic Metabolic Panel: Recent Labs  Lab 07/24/23 0636 07/25/23 0714 07/26/23 0726 07/27/23 0724 07/28/23 0139  NA 135 137 140 135 136  K 4.2 4.0 3.7 3.9 3.9  CL 103 104 105 102 101  CO2 23 24 24 23 27   GLUCOSE 169* 195* 105* 158* 192*  BUN 29* 27* 25* 19 18  CREATININE 0.97 1.00 1.20 1.08 1.05  CALCIUM 9.1 9.0 8.8* 8.7* 9.0  MG 2.0 2.0 1.9 1.8 1.7    Liver Function Tests: No results for input(s): "AST", "ALT", "ALKPHOS", "BILITOT", "PROT", "ALBUMIN" in the last 168 hours.  CBC: Recent Labs  Lab 07/24/23 0636 07/25/23 0714 07/26/23 0726 07/27/23 0724 07/28/23 0139  WBC 7.8 6.3 4.9 5.8 5.9  HGB 11.6* 10.7* 9.7* 10.0* 10.2*  HCT 35.3* 33.0* 30.0* 31.0* 31.8*  MCV 85.3 88.0 86.0 86.4 85.3  PLT 188 164 164 157 163    INR: Recent Labs  Lab 07/24/23  1610 07/25/23 0714 07/26/23 0726 07/27/23 0724 07/28/23 0139  INR 1.7* 1.8* 1.8* 2.0* 1.9*    Other results:    Imaging   No results found.   Medications:     Scheduled Medications:  allopurinol  100 mg Oral Daily   amLODipine  10 mg Oral Daily   clonazepam  0.5 mg Oral BID   fluticasone furoate-vilanterol  1 puff Inhalation Daily   And   umeclidinium bromide  1 puff Inhalation Daily   gabapentin  300 mg Oral BID   insulin  aspart  0-20 Units Subcutaneous TID WC   insulin aspart  0-5 Units Subcutaneous QHS   insulin glargine-yfgn  14 Units Subcutaneous Daily   losartan  25 mg Oral Daily   metoprolol succinate  25 mg Oral Daily   minocycline  200 mg Oral BID   mirtazapine  15 mg Oral QHS   pantoprazole  40 mg Oral Daily   sertraline  50 mg Oral Daily   spironolactone  25 mg Oral Daily   tamsulosin  0.4 mg Oral Daily   thiamine  100 mg Oral Daily   traZODone  150 mg Oral QHS   warfarin  6 mg Oral q1600   Warfarin - Pharmacist Dosing Inpatient   Does not apply q1600   zinc sulfate  220 mg Oral Daily    Infusions:  ceFEPime (MAXIPIME) IV 2 g (07/28/23 0519)   DAPTOmycin (CUBICIN) 800 mg in sodium chloride 0.9 % IVPB 800 mg (07/27/23 1454)    PRN Medications: acetaminophen, fluticasone, oxyCODONE, traMADol   Patient Profile  62 y.o. 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 Now s/p Heartmate 3 LVAD. Now admitted w/ suspected DL fracture after multiple falls and recurrent DL infection.  Assessment/Plan:   1.  LVAD Driveline infection: MRSA driveline infection, admitted in 6/23 and again in 7/23-8/23 with multiple trips to the OR for debridement.  Cultures have grown acinetobacter and MRSA. He was readmitted with driveline infection in 2/24, completed course of daptomycin for MRSA. Now readmitted w/ suspected DL fracture and DL infection. P/w "driveline communication fault" error message on LVAD.LVAD coordinator replaced modular cable and controller with resolution of driveline communication fault signal. CT C/A/P c/w prior imaging w/ similar appearance of mild circumferential thickening of the fat plane surrounding the drive line in the upper abdomen extending from the pump to the skin which may be chronic or  represent an infectious process. No drainable fluid collection.  - Started on empiric daptomycin/Unasyn per ID. Initial wound Cx + for Pseudomonas Aeruginosa (pan-sensitive) and S. aureus. Cefepime added. Now on Dapto, Cefepime and minocycline. BCx NGTD. Taken to OR 7/31 for wound debridement + application of wound vac. Returned to OR 8/7 for wound wash out/debridement - Continue abx per ID. Appreciate their assistance. ID recommends 4 weeks of daptomycin then Q 3 wks dalbavancin. Would have significant concern about him receiving IV abx at home with history of drug use and noncompliance. Would likely need to discharge to SNF if needs IV abx after discharge but he does not want to return to SNF. Plan currently is to stay in hospital until completion of IV therapy on 8/23.   2. Suspected DL fracture with multiple recent falls - Has had multiple falls during the last 2 months, one direct trauma to  mid sternum (fell on cinder block) - CT chest/abd/pelvis per above  - CXR w/ LVAD partially imaged. The associated lead noted to be intact w/ no fracture or kink  - LVAD coordinators changed modular cable and controller with resolution  - No alarms   3.  Chronic systolic CHF s/p LVAD: Echo 10/21 with EF 20-25%, mildly decreased RV function. LHC/RHC in 12/21 with patent grafts, low output. Suspect mixed ischemic/nonischemic cardiomyopathy (prior heavy ETOH and drugs as well as CAD). Admitted with cardiogenic shock in 12/21, had placement of Impella 5.5 initially, now s/p Heartmate 3 LVAD on 11/17/20.  Ramp echo 2/23 with speed decreased to 5500 rpm. Speed now back to 5600 rpm.  MAP elevated 90s-100s. He is not volume overloaded on exam. LVAD parameters stable.  - Continue current Toprol XL 25 mg daily.  - Increase losartan 25>50 mg daily (previously didn't tolerate Entresto with AKI and low VAD MAPs, will discuss with MD possibility of re-trial).  - Continue spironolactone 25 mg daily.   - INR 1.1 on admission.  Suspect noncompliant with Warfarin. - INR 1.9 today. Dosing per PharmD   4. CKD stage 3: Prior CTA abdomen/pelvis with high-grade stenosis of proximal right renal artery.  Creatinine stable 1.08 today.   5.  CAD: S/p CABG 12/20.  LHC pre-VAD with patent grafts, no target for intervention. - Continue Crestor 20 mg daily.   6.  Atrial fibrillation: Paroxysmal.  S/p Maze and LA appendage clip with CABG in 12/20.  Now in NSR.  - Continue Toprol XL 25 mg daily.  - Off amiodarone with hyperthyroidism.  7. Type 2 diabetes: improved control, Hgb A1c down from 12.1>>6.3  - continue SSI   8. 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 He says last use of methamphetamine was > 2 months ago. - UDS this admit negative (however collected > 48 hrs post admit)  - Declines substance use resources  9. 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  Followed by endocrinology. He is now off amiodarone. He had been on methimazole but has not been taking recently. TSH was high on admit and free T4 normal  - Will NOT restart methimazole   10. Anemia (chronic blood loss):  Hgb stable.   11. COPD: Prior smoker.  - Continue Trelegy.   12. PVCs/NSVT:  - Now off amiodarone.   13.  Depression: Has turned to substance abuse to cope.   - Has been referred to OP psych in [place]   14.  Gout: Continue allopurinol, he has prn colchicine.   15.  HTN: MAPs 100s-110s in the setting of known Rt RAS and abd pain - monitor closely, if remains elevated may need reassessment of renal artery stenosis and possible treatment. Significant calcifications in both renal arteries on CT abdomen this admit. - Continue Amlodipine 10 mg.  - Increase losartan 25>50 mg daily (previously didn't tolerate Entresto with AKI and low VAD MAPs).  - Continue spironolactone 25 mg daily.   16. Agitation: Klonopin increased, agitation improved  17. Incisional hernia: Large abdominal wall hernia in close proximity to  driveline. Cannot tolerate wearing abdominal binder.  Has chronic discomfort from the hernia and the driveline.  It is not incarcerated.  - Can try oxycodone at low dose. Has substance abuse history but really pretty miserable in hospital currently.  - No good option for hernia repair given driveline proximity.   I reviewed the LVAD parameters from today, and compared the results to the patient's prior recorded data.  No programming changes were made.  The LVAD is functioning within specified parameters.  The patient performs LVAD self-test daily.  LVAD interrogation was negative for any significant power changes, alarms or PI events/speed drops.  LVAD equipment check completed and is in good working order.  Back-up equipment present.   LVAD education done on emergency procedures and precautions and reviewed exit site care.  Length of Stay: 21  Alen Bleacher, NP 07/28/2023, 9:33 AM  VAD Team --- VAD ISSUES ONLY--- Pager [phone] (7am - 7am)  Advanced Heart Failure Team  Pager [phone] (M-F; 7a - 5p)  Please contact [facility] for night-coverage after hours (5p -7a ) and weekends on [email or web address]

## 2023-07-28 NOTE — Progress Notes (Signed)
ANTICOAGULATION CONSULT NOTE  Pharmacy Consult for warfarin  Indication:  LVAD  Allergies  Allergen Reactions   Vancomycin Nausea And Vomiting and Other (See Comments)    Severe joint pain    Patient Measurements: Height: 5\' 10"  (177.8 cm) Weight: 95.9 kg (211 lb 6.7 oz) IBW/kg (Calculated) : 73 Heparin Dosing Weight: 91.4 kg   Vital Signs: Temp: 98.3 F (36.8 C) (08/19 1200) Temp Source: Oral (08/19 1200) BP: 104/86 (08/19 1200) Pulse Rate: 75 (08/19 1200)  Labs: Recent Labs    07/26/23 0726 07/27/23 0724 07/28/23 0139  HGB 9.7* 10.0* 10.2*  HCT 30.0* 31.0* 31.8*  PLT 164 157 163  LABPROT 21.3* 22.5* 22.2*  INR 1.8* 2.0* 1.9*  CREATININE 1.20 1.08 1.05    Estimated Creatinine Clearance: 84.8 mL/min (by C-G formula based on SCr of 1.05 mg/dL).   Medical History: Past Medical History:  Diagnosis Date   "    Arthritis    CAD (coronary artery disease)    a. s/p CABG in 11/2019 with LIMA-LAD, SVG-OM1, SVG-PDA and SVG-D1   CHF (congestive heart failure) (HCC)    a. EF < 20% by echo in 11/2019   Essential hypertension    PAF (paroxysmal atrial fibrillation) (HCC)    Type 2 diabetes mellitus (HCC)     Medications:  Scheduled:   allopurinol  100 mg Oral Daily   amLODipine  10 mg Oral Daily   clonazepam  0.5 mg Oral BID   fluticasone furoate-vilanterol  1 puff Inhalation Daily   And   umeclidinium bromide  1 puff Inhalation Daily   gabapentin  300 mg Oral BID   insulin aspart  0-20 Units Subcutaneous TID WC   insulin aspart  0-5 Units Subcutaneous QHS   insulin glargine-yfgn  14 Units Subcutaneous Daily   [START ON 07/29/2023] losartan  50 mg Oral Daily   metoprolol succinate  25 mg Oral Daily   minocycline  200 mg Oral BID   mirtazapine  15 mg Oral QHS   pantoprazole  40 mg Oral Daily   sertraline  50 mg Oral Daily   spironolactone  25 mg Oral Daily   tamsulosin  0.4 mg Oral Daily   thiamine  100 mg Oral Daily   traZODone  150 mg Oral QHS   warfarin   6 mg Oral q1600   Warfarin - Pharmacist Dosing Inpatient   Does not apply q1600   zinc sulfate  220 mg Oral Daily    Assessment: 62 yom who presented to clinic with concern for driveline fracture and infection. Last [facility] appointment was on 04/21/2023 - INR was subtherapeutic at 1.4 and regimen at that time was 6 mg daily except 4 mg Tues/Thurs (LD 7/25).  Given low INR on admit, started fixed rate heparin 500 units/hr - now stopped. INR is just slightly below goal at 1.9 today. HGB stable 9-11, pltc stable 140-160. LDH stable 150-200. No s/sx of bleeding. No further debridements.  Goal of Therapy:  INR 2-2.5 Monitor platelets by anticoagulation protocol: Yes   Plan:  Continue warfarin 6mg  daily for now. May need boost if INR remains below 2. Monitor daily INR, CBC, LDH, and for s/sx of bleeding   Reece Leader, Colon Flattery, BCCP Clinical Pharmacist  07/28/2023 2:12 PM   [facility] pharmacy phone numbers are listed on [email or web address]

## 2023-07-28 NOTE — TOC Progression Note (Signed)
Transition of Care (TOC) - Progression Note    Patient Details  Name: Kevin Underwood. MRN: [id number] Date of Birth: [birth year]  Transition of Care [facility]) CM/SW Contact  Nicanor Bake Phone Number: [phone] 07/28/2023, 1:49 PM  Clinical Narrative:  CSW called and spoke with the pts wife over the phone. Pts wife stated that they have never used [facility] services before. Wife stated that the pt is only interested in coming home. Wife stated that are open to [facility] options. CSW informed the pts wife that either myself or the CM will have a list of options in the future if that is something they would like. Pts wife stated in the past they used a SNF in [place] and had an unpleasant experience. Pts wife asked about home hospice. CSW stated that she would inform the CM and team to see if that is where the pt is at and if a referral needed to be put in. TOC will continue following.     Expected Discharge Plan: Skilled Nursing Facility Barriers to Discharge: Continued Medical Work up  Expected Discharge Plan and Services   Discharge Planning Services: CM Consult Post Acute Care Choice: Skilled Nursing Facility Living arrangements for the past 2 months: Single Family Home                                       Social Determinants of Health (SDOH) Interventions SDOH Screenings   Food Insecurity: No Food Insecurity (07/08/2023)  Housing: Low Risk  (07/08/2023)  Transportation Needs: No Transportation Needs (07/08/2023)  Utilities: Not At Risk (07/08/2023)  Alcohol Screen: Low Risk  (11/10/2020)  Depression (PHQ2-9): Low Risk  (10/16/2022)  Financial Resource Strain: Medium Risk (05/17/2022)  Physical Activity: Inactive (11/10/2020)  Tobacco Use: Medium Risk (07/09/2023)    Readmission Risk Interventions    07/01/2022    9:05 AM 11/28/2020   11:57 AM  Readmission Risk Prevention Plan  Transportation Screening Complete Complete  Medication Review Oceanographer) Complete  Complete  PCP or Specialist appointment within 3-5 days of discharge Complete   HRI or Home Care Consult Complete Complete  SW Recovery Care/Counseling Consult Complete Complete  Palliative Care Screening Not Applicable Not Applicable  Skilled Nursing Facility Not Applicable Not Applicable

## 2023-07-29 DIAGNOSIS — T827XXA Infection and inflammatory reaction due to other cardiac and vascular devices, implants and grafts, initial encounter: Secondary | ICD-10-CM | POA: Diagnosis not present

## 2023-07-29 LAB — BASIC METABOLIC PANEL
Anion gap: 11 (ref 5–15)
BUN: 20 mg/dL (ref 8–23)
CO2: 26 mmol/L (ref 22–32)
Calcium: 8.7 mg/dL — ABNORMAL LOW (ref 8.9–10.3)
Chloride: 98 mmol/L (ref 98–111)
Creatinine, Ser: 1.07 mg/dL (ref 0.61–1.24)
GFR, Estimated: >60 mL/min (ref >60)
Glucose, Bld: 143 mg/dL — ABNORMAL HIGH (ref 70–99)
Potassium: 3.8 mmol/L (ref 3.5–5.1)
Sodium: 135 mmol/L (ref 135–145)

## 2023-07-29 LAB — CBC
HCT: 31.8 % — ABNORMAL LOW (ref 39.0–52.0)
Hemoglobin: 10.4 g/dL — ABNORMAL LOW (ref 13.0–17.0)
MCH: 27.9 pg (ref 26.0–34.0)
MCHC: 32.7 g/dL (ref 30.0–36.0)
MCV: 85.3 fL (ref 80.0–100.0)
Platelets: 155 10*3/uL (ref 150–400)
RBC: 3.73 MIL/uL — ABNORMAL LOW (ref 4.22–5.81)
RDW: 14.5 % (ref 11.5–15.5)
WBC: 6.4 10*3/uL (ref 4.0–10.5)
nRBC: 0 % (ref 0.0–0.2)

## 2023-07-29 LAB — PROTIME-INR
INR: 2.1 — ABNORMAL HIGH (ref 0.8–1.2)
Prothrombin Time: 23.7 s — ABNORMAL HIGH (ref 11.4–15.2)

## 2023-07-29 LAB — GLUCOSE, CAPILLARY
Glucose-Capillary: 199 mg/dL — ABNORMAL HIGH (ref 70–99)
Glucose-Capillary: 188 mg/dL — ABNORMAL HIGH (ref 70–99)
Glucose-Capillary: 195 mg/dL — ABNORMAL HIGH (ref 70–99)
Glucose-Capillary: 153 mg/dL — ABNORMAL HIGH (ref 70–99)

## 2023-07-29 LAB — LACTATE DEHYDROGENASE: LDH: 156 U/L (ref 98–192)

## 2023-07-29 LAB — MAGNESIUM: Magnesium: 1.7 mg/dL (ref 1.7–2.4)

## 2023-07-29 LAB — CK: Total CK: 49 U/L (ref 49–397)

## 2023-07-29 NOTE — Progress Notes (Signed)
Pharmacy Antibiotic Note  Kevin Underwood. is a 62 y.o. male admitted on 07/07/2023 with  driveline infection . Pharmacy has been consulted for daptomycin and cefepime dosing. Pt also continued on PTA minocycline for hx acinetobacter DLI.  Scr stable at 1.07 (CrCl 70 mL/min). WBC now WNL at 6.4, afebrile.Wound cultures growing Pseudomonas and MRSA, blood cultures negative. CK stable at 56 > 49.  Plan: Daptomycin 800 mg every 24 hours Cefepime 2g IV every 8 hours Minocycline 200 mg BID F/u blood and driveline cx and continue to monitor renal function Monitor CK weekly  Height: 5\' 10"  (177.8 cm) Weight: 94.9 kg (209 lb 3.5 oz) IBW/kg (Calculated) : 73  Temp (24hrs), Avg:98.2 F (36.8 C), Min:97.4 F (36.3 C), Max:98.9 F (37.2 C)  Recent Labs  Lab 07/25/23 0714 07/26/23 0726 07/27/23 0724 07/28/23 0139 07/29/23 0557  WBC 6.3 4.9 5.8 5.9 6.4  CREATININE 1.00 1.20 1.08 1.05 1.07    Estimated Creatinine Clearance: 82.8 mL/min (by C-G formula based on SCr of 1.07 mg/dL).    Allergies  Allergen Reactions   Vancomycin Nausea And Vomiting and Other (See Comments)    Severe joint pain    Antimicrobials this admission: 7/29 Daptomycin >>  7/29 Unasyn >> 7/31 7/31 Minocycline >>  7/31 Cefepime >>   Microbiology results: 7/29 BCx: negative 7/29 Driveline Wound Cx: pseudomonas/MRSA  7/31 Driveline abscess cx: pseudomonas (pan-sensitive)/MRSA  Thank you for allowing pharmacy to participate in this patient's care, Jenetta Downer, [facility] Clinical Pharmacist  07/29/2023 10:16 AM   [facility] pharmacy phone numbers are listed on [email or web address]

## 2023-07-29 NOTE — Progress Notes (Signed)
ANTICOAGULATION CONSULT NOTE  Pharmacy Consult for warfarin  Indication:  LVAD  Allergies  Allergen Reactions   Vancomycin Nausea And Vomiting and Other (See Comments)    Severe joint pain    Patient Measurements: Height: 5\' 10"  (177.8 cm) Weight: 94.9 kg (209 lb 3.5 oz) IBW/kg (Calculated) : 73 Heparin Dosing Weight: 91.4 kg   Vital Signs: Temp: 98.7 F (37.1 C) (08/20 0731) Temp Source: Oral (08/20 0731) BP: 129/96 (08/20 0923) Pulse Rate: 81 (08/20 0923)  Labs: Recent Labs    07/27/23 0724 07/28/23 0139 07/29/23 0557  HGB 10.0* 10.2* 10.4*  HCT 31.0* 31.8* 31.8*  PLT 157 163 155  LABPROT 22.5* 22.2* 23.7*  INR 2.0* 1.9* 2.1*  CREATININE 1.08 1.05 1.07  CKTOTAL  --   --  49    Estimated Creatinine Clearance: 82.8 mL/min (by C-G formula based on SCr of 1.07 mg/dL).   Medical History: Past Medical History:  Diagnosis Date   "    Arthritis    CAD (coronary artery disease)    a. s/p CABG in 11/2019 with LIMA-LAD, SVG-OM1, SVG-PDA and SVG-D1   CHF (congestive heart failure) (HCC)    a. EF < 20% by echo in 11/2019   Essential hypertension    PAF (paroxysmal atrial fibrillation) (HCC)    Type 2 diabetes mellitus (HCC)     Medications:  Scheduled:   allopurinol  100 mg Oral Daily   amLODipine  10 mg Oral Daily   clonazepam  0.5 mg Oral BID   fluticasone furoate-vilanterol  1 puff Inhalation Daily   And   umeclidinium bromide  1 puff Inhalation Daily   gabapentin  300 mg Oral BID   insulin aspart  0-20 Units Subcutaneous TID WC   insulin aspart  0-5 Units Subcutaneous QHS   insulin glargine-yfgn  14 Units Subcutaneous Daily   losartan  50 mg Oral Daily   metoprolol succinate  25 mg Oral Daily   minocycline  200 mg Oral BID   mirtazapine  15 mg Oral QHS   pantoprazole  40 mg Oral Daily   sertraline  50 mg Oral Daily   spironolactone  25 mg Oral Daily   tamsulosin  0.4 mg Oral Daily   thiamine  100 mg Oral Daily   traZODone  150 mg Oral QHS    warfarin  6 mg Oral q1600   Warfarin - Pharmacist Dosing Inpatient   Does not apply q1600   zinc sulfate  220 mg Oral Daily    Assessment: 62 yom who presented to clinic with concern for driveline fracture and infection. Last [facility] appointment was on 04/21/2023 - INR was subtherapeutic at 1.4 and regimen at that time was 6 mg daily except 4 mg Tues/Thurs (LD 7/25).  Given low INR on admit, started fixed rate heparin 500 units/hr - now stopped.   INR is at goal today at 2.1. HGB stable 9-11, pltc stable 140-160. LDH stable 150-200. No s/sx of bleeding. No further debridements.  Goal of Therapy:  INR 2-2.5 Monitor platelets by anticoagulation protocol: Yes   Plan:  Continue warfarin 6mg  daily. Monitor daily INR, CBC, LDH, and for s/sx of bleeding   Reece Leader, Colon Flattery, [facility] Clinical Pharmacist  07/29/2023 10:09 AM   [facility] pharmacy phone numbers are listed on [email or web address]

## 2023-07-29 NOTE — Progress Notes (Signed)
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 Started on IV antibiotics.   Patient presented to VAD clinic 7/29 for driveline communication fault- stating that he has had "alarms every couple hrs from his VAD for a couple months." Pt has no showed and cancelled all appointments in the past 2 months. Pt also had green/blue drainage visible through his driveline bandage. (Dressing change and wound cx obtained). Pt states that he has experienced dizziness associated with a fall multiple times over the past couple months but no signs of bleeding. Controller and modular cable exchanged. No further communication fault alarms noted.   CT Chest/Abd/Pelvis 7/29 1. Similar appearance of mild circumferential thickening of the fat plane surrounding the drive line in the upper abdomen extending from the pump to the skin which may be chronic or represent an infectious process. No drainable fluid collection.  Pt had drive line debridement in OR with placement of instillation wound vac on 07/09/23 with Dr Donata Clay. This was change to wet/dry on Friday 8/2 due to ongoing alarms related to the pts hernia.   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 Currently on IV Cefepime Q 8 hrs and IV Daptomycin 800 mg daily per ID team. ID team is recommending 4 weeks IV antibiotics. Pt is refusing to go a facility for IV antibiotics.   Pt lying in bed upon my arrival this morning. Wife Tracey at bedside. States his pain is better under control. States abdominal binder helps but he has difficulty getting it tight enough. Advised for pt to request assistance from nursing staff.  Will plan to keep hospitalized while completing IV antibiotics.   Vital signs: Temp: 98 HR: 85 Doppler Pressure: 80 Automatic BP: 92/74 (81) O2 Sat: 96% on RA Wt: 193.3>194.8>196.4>194.7>...>205.5>206.6>207.8>209.2>211.4>209.2  lbs  LVAD interrogation reveals:  Speed: 5600 Flow: 4.6 Power: 4w PI: 2.9 Alarms: none Events: 10 PI events Hct: 32  Fixed speed:  5600 Low speed limit:  5300  Drive Line: Existing VAD dressing removed and site care performed using sterile technique. Wound bed cleansed with VASHE solution and sterile 4x4 gauze through light manual debridement. Skin surrounding wound bed cleaned with Chlora prep applicators x 2, allowed to dry. VASHE moistened 4 x 4 placed in wound bed, covered with several dry 4 x 4s. Wound tunnels approx 2 cm.  Drive line unincorporated. Moderate amount of thick yellow drainage noted. No redness, tenderness, foul odor or rash noted. There is a skin tear from the tegaderm.  Drive line anchor secured.   Continue daily wet to dry dressing changes using VASHE solution per bedside RN or VAD coordinator. Next dressing change due 07/30/23 by bedside nurse.   Labs:  LDH trend: 121>133>130>138>172>175>165>158>169>155>170>167>173>161>163>156  INR trend: 1.0>1.1>1.2>1.4>1.3>1.4>1.5>1.4>1.8>1.9>1.7>1.8>1.9>2.1  Anticoagulation Plan: -INR Goal: 2.0 - 2.5 -ASA Dose: none  ICD: N/A  Infection:  07/07/23>> blood cultures>> no growth 5 days; final  07/07/23>> wound cx>> few pseudomonas aeruginosa and MRSA; final  07/09/23>> OR driveline cx>> Few pseudomonas aeruginosa and METHICILLIN RESISTANT STAPHYLOCOCCUS AUREUS 07/09/23>> blood cultures>>no growth x 5 days; final  Drips:    Plan/Recommendations:  Page VAD coordinator with equipment issues or driveline problems Daily drive line wet to dry dressing changes with VASHE solution.   Simmie Davies RN,BSN VAD Coordinator  Office: [phone]  24/7 Pager: [phone]

## 2023-07-29 NOTE — TOC Progression Note (Signed)
Transition of Care (TOC) - Progression Note    Patient Details  Name: Loukas Cruzen. MRN: [id number] Date of Birth: 09/23/1961  Transition of Care [facility]) CM/SW Contact  Nicanor Bake Phone Number: [phone] 07/29/2023, 1:47 PM  Clinical Narrative:  CSW called and spoke with wife. CSW and wife plan to meet tomorrow at the nurses station at 9 am to discuss the list of HH options. CSW left copies of HH options in the pts chart. TOC will continue following.     Expected Discharge Plan: Skilled Nursing Facility Barriers to Discharge: Continued Medical Work up  Expected Discharge Plan and Services   Discharge Planning Services: CM Consult Post Acute Care Choice: Skilled Nursing Facility Living arrangements for the past 2 months: Single Family Home                                       Social Determinants of Health (SDOH) Interventions SDOH Screenings   Food Insecurity: No Food Insecurity (07/08/2023)  Housing: Low Risk  (07/08/2023)  Transportation Needs: No Transportation Needs (07/08/2023)  Utilities: Not At Risk (07/08/2023)  Alcohol Screen: Low Risk  (11/10/2020)  Depression (PHQ2-9): Low Risk  (10/16/2022)  Financial Resource Strain: Medium Risk (05/17/2022)  Physical Activity: Inactive (11/10/2020)  Tobacco Use: Medium Risk (07/09/2023)    Readmission Risk Interventions    07/01/2022    9:05 AM 11/28/2020   11:57 AM  Readmission Risk Prevention Plan  Transportation Screening Complete Complete  Medication Review Oceanographer) Complete Complete  PCP or Specialist appointment within 3-5 days of discharge Complete   HRI or Home Care Consult Complete Complete  SW Recovery Care/Counseling Consult Complete Complete  Palliative Care Screening Not Applicable Not Applicable  Skilled Nursing Facility Not Applicable Not Applicable

## 2023-07-29 NOTE — Plan of Care (Signed)
  Problem: Education: Goal: Patient will understand all VAD equipment and how it functions Outcome: Progressing Goal: Patient will be able to verbalize current INR target range and antiplatelet therapy for discharge home Outcome: Progressing   Problem: Cardiac: Goal: LVAD will function as expected and patient will experience no clinical alarms Outcome: Progressing   Problem: Education: Goal: Knowledge of General Education information will improve Description: Including pain rating scale, medication(s)/side effects and non-pharmacologic comfort measures Outcome: Progressing   Problem: Health Behavior/Discharge Planning: Goal: Ability to manage health-related needs will improve Outcome: Progressing   Problem: Clinical Measurements: Goal: Ability to maintain clinical measurements within normal limits will improve Outcome: Progressing Goal: Will remain free from infection Outcome: Progressing Goal: Diagnostic test results will improve Outcome: Progressing Goal: Respiratory complications will improve Outcome: Progressing Goal: Cardiovascular complication will be avoided Outcome: Progressing   Problem: Activity: Goal: Risk for activity intolerance will decrease Outcome: Progressing   Problem: Nutrition: Goal: Adequate nutrition will be maintained Outcome: Progressing   Problem: Coping: Goal: Level of anxiety will decrease Outcome: Progressing   Problem: Elimination: Goal: Will not experience complications related to bowel motility Outcome: Progressing Goal: Will not experience complications related to urinary retention Outcome: Progressing   Problem: Pain Managment: Goal: General experience of comfort will improve Outcome: Progressing   Problem: Safety: Goal: Ability to remain free from injury will improve Outcome: Progressing   Problem: Skin Integrity: Goal: Risk for impaired skin integrity will decrease Outcome: Progressing   Problem: Education: Goal: Ability  to describe self-care measures that may prevent or decrease complications (Diabetes Survival Skills Education) will improve Outcome: Progressing Goal: Individualized Educational Video(s) Outcome: Progressing   Problem: Coping: Goal: Ability to adjust to condition or change in health will improve Outcome: Progressing   Problem: Fluid Volume: Goal: Ability to maintain a balanced intake and output will improve Outcome: Progressing   Problem: Health Behavior/Discharge Planning: Goal: Ability to identify and utilize available resources and services will improve Outcome: Progressing Goal: Ability to manage health-related needs will improve Outcome: Progressing   Problem: Metabolic: Goal: Ability to maintain appropriate glucose levels will improve Outcome: Progressing   Problem: Nutritional: Goal: Maintenance of adequate nutrition will improve Outcome: Progressing Goal: Progress toward achieving an optimal weight will improve Outcome: Progressing   Problem: Skin Integrity: Goal: Risk for impaired skin integrity will decrease Outcome: Progressing   Problem: Tissue Perfusion: Goal: Adequacy of tissue perfusion will improve Outcome: Progressing   

## 2023-07-29 NOTE — Progress Notes (Signed)
Patient ID: Kevin Underwood., male   DOB: 1961-12-04, 62 y.o.   MRN: [id number]   Advanced Heart Failure VAD Team Note  PCP-Cardiologist: Nona Dell, MD  [facility]: Dr. Shirlee Latch   Subjective:    7/29: admitted w/ suspected DL fracture and DL infection. P/w "driveline communication fault" error message on LVAD. No pump stoppages. LVAD coordinator replaced modular cable and controller with resolution of driveline communication fault signal. CT C/A/P c/w prior imaging w/ similar appearance of mild circumferential thickening of the fat plane surrounding the drive line in the upper abdomen extending from the pump to the skin which may be chronic or represent an infectious process. No drainable fluid collection. Wound + BCx collected. Started on empiric daptomycin/Unasyn per ID.  7/30: Wound Cx growing Pseudomonas Aeruginosa (pan-sensitive) and S. aureus. Cefepime added 7/31: OR for wound debridement + application of wound vac.   8/8: Returned to OR for I&D 8/11: Losartan increased to 25 mg daily 8/19: Losartan increased to 50 mg daily for MAPS 100-110s  Remains on Daptomycin, Cefepime and minocycline. Surgical wound with pseudomonas aeruginosa and MRSA. Bcx from 7/29 and 7/31 NGTD. Plan to complete IV abx 8/23  Losartan increased yesterday, VAD MAPs much improved. 70s-80s today.   Feels fine, got up the chair yesterday. Foot hurts so he's unable to ambulate too much.   LVAD INTERROGATION:  HeartMate III LVAD:   Flow 4.2 liters/min, speed 5600, power 4.1, PI  3.7. 23 PI events this morning and other alarms to change backup battery (LVAD coordinator aware, will come change shortly)  Objective:    Vital Signs:   Temp:  [97.4 F (36.3 C)-98.9 F (37.2 C)] 98.7 F (37.1 C) (08/20 0731) Pulse Rate:  [75-99] 81 (08/20 0923) Resp:  [12-18] 18 (08/20 0731) BP: (87-138)/(63-98) 129/96 (08/20 0923) SpO2:  [95 %-98 %] 97 % (08/20 0731) Weight:  [94.9 kg] 94.9 kg (08/20 0405) Last BM Date :  07/28/23 Mean arterial Pressure 70s-80s Intake/Output:   Intake/Output Summary (Last 24 hours) at 07/29/2023 0954 Last data filed at 07/29/2023 0000 Gross per 24 hour  Intake 972 ml  Output --  Net 972 ml    Physical Exam  General:  Well appearing. No resp difficulty HEENT: Normal Neck: supple. JVP ~5. Carotids 2+ bilat; no bruits. No lymphadenopathy or thyromegaly appreciated. Cor: Mechanical heart sounds with LVAD hum present. Lungs: Clear Abdomen: soft, nontender, nondistended. No hepatosplenomegaly. No bruits or masses. Good bowel sounds. +large abd hernia Driveline: C/D/I; securement device intact. Dressing around DL exit site, mod drainage.  Extremities: no cyanosis, clubbing, rash, edema Neuro: alert & orientedx3, cranial nerves grossly intact. moves all 4 extremities w/o difficulty. Affect pleasant   Telemetry   NSR 80s (Personally reviewed)    Labs   Basic Metabolic Panel: Recent Labs  Lab 07/25/23 0714 07/26/23 0726 07/27/23 0724 07/28/23 0139 07/29/23 0557  NA 137 140 135 136 135  K 4.0 3.7 3.9 3.9 3.8  CL 104 105 102 101 98  CO2 24 24 23 27 26   GLUCOSE 195* 105* 158* 192* 143*  BUN 27* 25* 19 18 20   CREATININE 1.00 1.20 1.08 1.05 1.07  CALCIUM 9.0 8.8* 8.7* 9.0 8.7*  MG 2.0 1.9 1.8 1.7 1.7    Liver Function Tests: No results for input(s): "AST", "ALT", "ALKPHOS", "BILITOT", "PROT", "ALBUMIN" in the last 168 hours.  CBC: Recent Labs  Lab 07/25/23 0714 07/26/23 0726 07/27/23 0724 07/28/23 0139 07/29/23 0557  WBC 6.3 4.9 5.8 5.9 6.4  HGB 10.7* 9.7* 10.0* 10.2* 10.4*  HCT 33.0* 30.0* 31.0* 31.8* 31.8*  MCV 88.0 86.0 86.4 85.3 85.3  PLT 164 164 157 163 155    INR: Recent Labs  Lab 07/25/23 0714 07/26/23 0726 07/27/23 0724 07/28/23 0139 07/29/23 0557  INR 1.8* 1.8* 2.0* 1.9* 2.1*    Other results:    Imaging   No results found.   Medications:     Scheduled Medications:  allopurinol  100 mg Oral Daily   amLODipine  10 mg  Oral Daily   clonazepam  0.5 mg Oral BID   fluticasone furoate-vilanterol  1 puff Inhalation Daily   And   umeclidinium bromide  1 puff Inhalation Daily   gabapentin  300 mg Oral BID   insulin aspart  0-20 Units Subcutaneous TID WC   insulin aspart  0-5 Units Subcutaneous QHS   insulin glargine-yfgn  14 Units Subcutaneous Daily   losartan  50 mg Oral Daily   metoprolol succinate  25 mg Oral Daily   minocycline  200 mg Oral BID   mirtazapine  15 mg Oral QHS   pantoprazole  40 mg Oral Daily   sertraline  50 mg Oral Daily   spironolactone  25 mg Oral Daily   tamsulosin  0.4 mg Oral Daily   thiamine  100 mg Oral Daily   traZODone  150 mg Oral QHS   warfarin  6 mg Oral q1600   Warfarin - Pharmacist Dosing Inpatient   Does not apply q1600   zinc sulfate  220 mg Oral Daily    Infusions:  ceFEPime (MAXIPIME) IV 2 g (07/29/23 0647)   DAPTOmycin (CUBICIN) 800 mg in sodium chloride 0.9 % IVPB 800 mg (07/28/23 1335)    PRN Medications: acetaminophen, fluticasone, oxyCODONE, traMADol   Patient Profile  62 y.o. 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 Now s/p Heartmate 3 LVAD. Now admitted w/ suspected DL fracture after multiple falls and recurrent DL infection.  Assessment/Plan:   1.  LVAD Driveline infection: MRSA driveline infection, admitted in 6/23 and again in 7/23-8/23 with multiple trips to the OR for debridement.  Cultures have grown acinetobacter and MRSA. He was readmitted with driveline infection in 2/24, completed course of daptomycin for MRSA. Now readmitted w/ suspected DL fracture and DL infection. P/w "driveline communication fault" error message on LVAD.LVAD coordinator replaced modular cable and controller with resolution of driveline communication fault signal. CT C/A/P c/w  prior imaging w/ similar appearance of mild circumferential thickening of the fat plane surrounding the drive line in the upper abdomen extending from the pump to the skin which may be chronic or represent an infectious process. No drainable fluid collection.  - Started on empiric daptomycin/Unasyn per ID. Initial wound Cx + for Pseudomonas Aeruginosa (pan-sensitive) and S. aureus. Cefepime added. Now on Dapto, Cefepime and minocycline. BCx NGTD. Taken to OR 7/31 for wound debridement + application of wound vac. Returned to OR 8/7 for wound wash out/debridement - Continue abx per ID. Appreciate their assistance. ID recommends 4 weeks of daptomycin then Q 3 wks dalbavancin. Would have significant concern about him receiving IV abx at home with history of drug use and noncompliance. Would likely need to discharge to SNF if needs IV abx after discharge but he does not want to return to SNF. Plan currently  is to stay in hospital until completion of IV therapy on 8/23.   2. Suspected DL fracture with multiple recent falls - Has had multiple falls during the last 2 months (prior to admit), one direct trauma to mid sternum (fell on cinder block) - CT chest/abd/pelvis per above  - CXR w/ LVAD partially imaged. The associated lead noted to be intact w/ no fracture or kink  - LVAD coordinators changed modular cable and controller with resolution  - No alarms   3.  Chronic systolic CHF s/p LVAD: Echo 10/21 with EF 20-25%, mildly decreased RV function. LHC/RHC in 12/21 with patent grafts, low output. Suspect mixed ischemic/nonischemic cardiomyopathy (prior heavy ETOH and drugs as well as CAD). Admitted with cardiogenic shock in 12/21, had placement of Impella 5.5 initially, now s/p Heartmate 3 LVAD on 11/17/20.  Ramp echo 2/23 with speed decreased to 5500 rpm. Speed now back to 5600 rpm.  MAPs improved 70s-80s. He is not volume overloaded on exam. LVAD parameters stable.  - Continue current Toprol XL 25 mg daily.   - Continue losartan 50 mg daily (previously didn't tolerate Entresto with AKI and low VAD MAPs, will discuss with MD possibility of re-trial). Will avoid Entresto for now. VAD MAPs improved 70s-80s with higher losartan dose - Continue spironolactone 25 mg daily.   - INR 1.1 on admission. Suspect noncompliant with Warfarin. - INR 2.1 today. Dosing per PharmD   4. CKD stage 3: Prior CTA abdomen/pelvis with high-grade stenosis of proximal right renal artery.  Creatinine stable 1.08 today.   5.  CAD: S/p CABG 12/20.  LHC pre-VAD with patent grafts, no target for intervention. - Continue Crestor 20 mg daily.   6.  Atrial fibrillation: Paroxysmal.  S/p Maze and LA appendage clip with CABG in 12/20.  Now in NSR.  - Continue Toprol XL 25 mg daily.  - Off amiodarone with hyperthyroidism.  7. Type 2 diabetes: improved control, Hgb A1c down from 12.1>>6.3  - continue SSI   8. 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 He says last use of methamphetamine was > 2 months ago. - UDS this admit negative (however collected > 48 hrs post admit)  - Declines substance use resources  9. 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  Followed by endocrinology. He is now off amiodarone. He had been on methimazole but has not been taking recently. TSH was high on admit and free T4 normal  - Will NOT restart methimazole   10. Anemia (chronic blood loss):  Hgb stable.   11. COPD: Prior smoker.  - Continue Trelegy.   12. PVCs/NSVT:  - Now off amiodarone.   13.  Depression: Has turned to substance abuse to cope.   - Has been referred to OP psych in [place]   14.  Gout: Continue allopurinol, he has prn colchicine.   15.  HTN: MAPs 100s-110s in the setting of known Rt RAS and abd pain. Now improved 70s-80s today - monitor closely, if remains elevated may need reassessment of renal artery stenosis and possible treatment. Significant calcifications in both renal arteries on CT  abdomen this admit. - Continue Amlodipine 10 mg.  - Continue losartan 50 mg daily (previously didn't tolerate Entresto with AKI and low VAD MAPs).  - Continue spironolactone 25 mg daily.   16. Agitation: Klonopin increased, agitation improved  17. Incisional hernia: Large abdominal wall hernia in close proximity to driveline. Cannot tolerate wearing abdominal binder.  Has chronic discomfort from the hernia and the  driveline.  It is not incarcerated.  - Continue oxycodone at low dose. Has substance abuse history but really pretty miserable in hospital currently.  - No good option for hernia repair given driveline proximity.   I reviewed the LVAD parameters from today, and compared the results to the patient's prior recorded data.  No programming changes were made.  The LVAD is functioning within specified parameters.  The patient performs LVAD self-test daily.  LVAD interrogation was negative for any significant power changes, alarms or PI events/speed drops.  LVAD equipment check completed and is in good working order.  Back-up equipment present.   LVAD education done on emergency procedures and precautions and reviewed exit site care.  Length of Stay: 22  Alen Bleacher, NP 07/29/2023, 9:54 AM  VAD Team --- VAD ISSUES ONLY--- Pager [phone] (7am - 7am)  Advanced Heart Failure Team  Pager [phone] (M-F; 7a - 5p)  Please contact [facility] for night-coverage after hours (5p -7a ) and weekends on [email or web address]

## 2023-07-30 ENCOUNTER — Inpatient Hospital Stay (HOSPITAL_COMMUNITY): Payer: 59

## 2023-07-30 DIAGNOSIS — T827XXA Infection and inflammatory reaction due to other cardiac and vascular devices, implants and grafts, initial encounter: Secondary | ICD-10-CM | POA: Diagnosis not present

## 2023-07-30 LAB — BASIC METABOLIC PANEL
Anion gap: 12 (ref 5–15)
BUN: 21 mg/dL (ref 8–23)
CO2: 22 mmol/L (ref 22–32)
Calcium: 9 mg/dL (ref 8.9–10.3)
Chloride: 100 mmol/L (ref 98–111)
Creatinine, Ser: 0.92 mg/dL (ref 0.61–1.24)
GFR, Estimated: >60 mL/min (ref >60)
Glucose, Bld: 151 mg/dL — ABNORMAL HIGH (ref 70–99)
Potassium: 4 mmol/L (ref 3.5–5.1)
Sodium: 134 mmol/L — ABNORMAL LOW (ref 135–145)

## 2023-07-30 LAB — CBC
HCT: 34.3 % — ABNORMAL LOW (ref 39.0–52.0)
Hemoglobin: 11.1 g/dL — ABNORMAL LOW (ref 13.0–17.0)
MCH: 28 pg (ref 26.0–34.0)
MCHC: 32.4 g/dL (ref 30.0–36.0)
MCV: 86.4 fL (ref 80.0–100.0)
Platelets: 173 10*3/uL (ref 150–400)
RBC: 3.97 MIL/uL — ABNORMAL LOW (ref 4.22–5.81)
RDW: 14.2 % (ref 11.5–15.5)
WBC: 7 10*3/uL (ref 4.0–10.5)
nRBC: 0 % (ref 0.0–0.2)

## 2023-07-30 LAB — GLUCOSE, CAPILLARY
Glucose-Capillary: 132 mg/dL — ABNORMAL HIGH (ref 70–99)
Glucose-Capillary: 148 mg/dL — ABNORMAL HIGH (ref 70–99)
Glucose-Capillary: 228 mg/dL — ABNORMAL HIGH (ref 70–99)
Glucose-Capillary: 216 mg/dL — ABNORMAL HIGH (ref 70–99)

## 2023-07-30 LAB — PROTIME-INR
INR: 2.2 — ABNORMAL HIGH (ref 0.8–1.2)
Prothrombin Time: 24.4 s — ABNORMAL HIGH (ref 11.4–15.2)

## 2023-07-30 LAB — LACTATE DEHYDROGENASE: LDH: 165 U/L (ref 98–192)

## 2023-07-30 LAB — MAGNESIUM: Magnesium: 1.7 mg/dL (ref 1.7–2.4)

## 2023-07-30 MED ORDER — OXYCODONE HCL 5 MG PO TABS
5.0000 mg | ORAL_TABLET | ORAL | Status: DC | PRN
  Administered 2023-07-30 – 2023-07-31 (×4): 5 mg via ORAL
  Filled 2023-07-30 (×4): qty 1

## 2023-07-30 NOTE — Progress Notes (Signed)
ANTICOAGULATION CONSULT NOTE  Pharmacy Consult for warfarin  Indication:  LVAD  Allergies  Allergen Reactions   Vancomycin Nausea And Vomiting and Other (See Comments)    Severe joint pain    Patient Measurements: Height: 5\' 10"  (177.8 cm) Weight: 93.8 kg (206 lb 12.7 oz) IBW/kg (Calculated) : 73 Heparin Dosing Weight: 91.4 kg   Vital Signs: Temp: 98.8 F (37.1 C) (08/21 0731) Temp Source: Oral (08/21 0731) BP: 108/92 (08/21 0922) Pulse Rate: 84 (08/21 0922)  Labs: Recent Labs    07/28/23 0139 07/29/23 0557 07/30/23 0702  HGB 10.2* 10.4* 11.1*  HCT 31.8* 31.8* 34.3*  PLT 163 155 173  LABPROT 22.2* 23.7* 24.4*  INR 1.9* 2.1* 2.2*  CREATININE 1.05 1.07 0.92  CKTOTAL  --  49  --     Estimated Creatinine Clearance: 95.7 mL/min (by C-G formula based on SCr of 0.92 mg/dL).   Medical History: Past Medical History:  Diagnosis Date   "    Arthritis    CAD (coronary artery disease)    a. s/p CABG in 11/2019 with LIMA-LAD, SVG-OM1, SVG-PDA and SVG-D1   CHF (congestive heart failure) (HCC)    a. EF < 20% by echo in 11/2019   Essential hypertension    PAF (paroxysmal atrial fibrillation) (HCC)    Type 2 diabetes mellitus (HCC)     Medications:  Scheduled:   allopurinol  100 mg Oral Daily   amLODipine  10 mg Oral Daily   clonazepam  0.5 mg Oral BID   fluticasone furoate-vilanterol  1 puff Inhalation Daily   And   umeclidinium bromide  1 puff Inhalation Daily   gabapentin  300 mg Oral BID   insulin aspart  0-20 Units Subcutaneous TID WC   insulin aspart  0-5 Units Subcutaneous QHS   insulin glargine-yfgn  14 Units Subcutaneous Daily   losartan  50 mg Oral Daily   metoprolol succinate  25 mg Oral Daily   minocycline  200 mg Oral BID   mirtazapine  15 mg Oral QHS   pantoprazole  40 mg Oral Daily   sertraline  50 mg Oral Daily   spironolactone  25 mg Oral Daily   tamsulosin  0.4 mg Oral Daily   thiamine  100 mg Oral Daily   traZODone  150 mg Oral QHS    warfarin  6 mg Oral q1600   Warfarin - Pharmacist Dosing Inpatient   Does not apply q1600   zinc sulfate  220 mg Oral Daily    Assessment: 62 yom who presented to clinic with concern for driveline fracture and infection. Last [facility] appointment was on 04/21/2023 - INR was subtherapeutic at 1.4 and regimen at that time was 6 mg daily except 4 mg Tues/Thurs (LD 7/25).  Given low INR on admit, started fixed rate heparin 500 units/hr - now stopped.   INR is at goal today at 2.2 HGB stable 9-11, pltc stable 140-160. LDH stable 150-200. No s/sx of bleeding. No further debridements.  Goal of Therapy:  INR 2-2.5 Monitor platelets by anticoagulation protocol: Yes   Plan:  Continue warfarin 6mg  daily. Monitor daily INR, CBC, LDH, and for s/sx of bleeding   Reece Leader, Colon Flattery, [facility] Clinical Pharmacist  07/30/2023 10:24 AM   [facility] pharmacy phone numbers are listed on [email or web address]

## 2023-07-30 NOTE — Progress Notes (Signed)
Patient ID: Kevin Rigg., male   DOB: 04-16-1961, 62 y.o.   MRN: [id number]   Advanced Heart Failure VAD Team Note  PCP-Cardiologist: Nona Dell, MD  [facility]: Dr. Shirlee Latch   Subjective:    7/29: admitted w/ suspected DL fracture and DL infection. P/w "driveline communication fault" error message on LVAD. No pump stoppages. LVAD coordinator replaced modular cable and controller with resolution of driveline communication fault signal. CT C/A/P c/w prior imaging w/ similar appearance of mild circumferential thickening of the fat plane surrounding the drive line in the upper abdomen extending from the pump to the skin which may be chronic or represent an infectious process. No drainable fluid collection. Wound + BCx collected. Started on empiric daptomycin/Unasyn per ID.  7/30: Wound Cx growing Pseudomonas Aeruginosa (pan-sensitive) and S. aureus. Cefepime added 7/31: OR for wound debridement + application of wound vac.   8/8: Returned to OR for I&D  Remains on Daptomycin, Cefepime and minocycline. Surgical wound with pseudomonas aeruginosa and MRSA. Bcx from 7/29 and 7/31 NGTD. Plan to complete IV abx through 8/23.  More pain at driveline site and abdominal hernia. Increased wound drainage.  MAPs higher today, 90s   LVAD INTERROGATION:  HeartMate III LVAD:   Flow 4.2 liters/min, speed 5600 power 4, PI  3.2. 21 PIs so far today.  Objective:    Vital Signs:   Temp:  [98 F (36.7 C)-99.3 F (37.4 C)] 98.8 F (37.1 C) (08/21 0731) Pulse Rate:  [80-88] 82 (08/21 0731) Resp:  [14-18] 17 (08/21 0731) BP: (85-129)/(70-96) 107/91 (08/21 0731) SpO2:  [92 %-99 %] 99 % (08/21 0731) Weight:  [93.8 kg] 93.8 kg (08/21 0630) Last BM Date : 07/29/23 Mean arterial Pressure 90s Intake/Output:   Intake/Output Summary (Last 24 hours) at 07/30/2023 0840 Last data filed at 07/30/2023 0303 Gross per 24 hour  Intake 366 ml  Output --  Net 366 ml    Physical Exam  Physical Exam: GENERAL:  Well appearing. HEENT: normal  NECK: Supple, JVP not elevated .  2+ bilaterally, no bruits.   CARDIAC:  Mechanical heart sounds with LVAD hum present.  LUNGS:  Clear to auscultation bilaterally.  ABDOMEN:  Soft, round, positive bowel sounds x4. Large abdominal hernia. LVAD exit site: Drainage noted on dressing at driveline site. Stabilization device present and accurately applied.  Driveline dressing is being changed daily per sterile technique. EXTREMITIES:  Warm and dry, no cyanosis, clubbing, rash or edema  NEUROLOGIC:  Alert and oriented x 4.  Affect pleasant.      Telemetry   SR 80s  Labs   Basic Metabolic Panel: Recent Labs  Lab 07/26/23 0726 07/27/23 0724 07/28/23 0139 07/29/23 0557 07/30/23 0702  NA 140 135 136 135 134*  K 3.7 3.9 3.9 3.8 4.0  CL 105 102 101 98 100  CO2 24 23 27 26 22   GLUCOSE 105* 158* 192* 143* 151*  BUN 25* 19 18 20 21   CREATININE 1.20 1.08 1.05 1.07 0.92  CALCIUM 8.8* 8.7* 9.0 8.7* 9.0  MG 1.9 1.8 1.7 1.7 1.7    Liver Function Tests: No results for input(s): "AST", "ALT", "ALKPHOS", "BILITOT", "PROT", "ALBUMIN" in the last 168 hours.  CBC: Recent Labs  Lab 07/26/23 0726 07/27/23 0724 07/28/23 0139 07/29/23 0557 07/30/23 0702  WBC 4.9 5.8 5.9 6.4 7.0  HGB 9.7* 10.0* 10.2* 10.4* 11.1*  HCT 30.0* 31.0* 31.8* 31.8* 34.3*  MCV 86.0 86.4 85.3 85.3 86.4  PLT 164 157 163 155 173  INR: Recent Labs  Lab 07/26/23 0726 07/27/23 0724 07/28/23 0139 07/29/23 0557 07/30/23 0702  INR 1.8* 2.0* 1.9* 2.1* 2.2*    Other results:    Imaging   No results found.   Medications:     Scheduled Medications:  allopurinol  100 mg Oral Daily   amLODipine  10 mg Oral Daily   clonazepam  0.5 mg Oral BID   fluticasone furoate-vilanterol  1 puff Inhalation Daily   And   umeclidinium bromide  1 puff Inhalation Daily   gabapentin  300 mg Oral BID   insulin aspart  0-20 Units Subcutaneous TID WC   insulin aspart  0-5 Units Subcutaneous  QHS   insulin glargine-yfgn  14 Units Subcutaneous Daily   losartan  50 mg Oral Daily   metoprolol succinate  25 mg Oral Daily   minocycline  200 mg Oral BID   mirtazapine  15 mg Oral QHS   pantoprazole  40 mg Oral Daily   sertraline  50 mg Oral Daily   spironolactone  25 mg Oral Daily   tamsulosin  0.4 mg Oral Daily   thiamine  100 mg Oral Daily   traZODone  150 mg Oral QHS   warfarin  6 mg Oral q1600   Warfarin - Pharmacist Dosing Inpatient   Does not apply q1600   zinc sulfate  220 mg Oral Daily    Infusions:  ceFEPime (MAXIPIME) IV 2 g (07/30/23 0520)   DAPTOmycin (CUBICIN) 800 mg in sodium chloride 0.9 % IVPB Stopped (07/29/23 1514)    PRN Medications: acetaminophen, fluticasone, oxyCODONE, traMADol   Patient Profile  62 y.o. 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 Now s/p Heartmate 3 LVAD. Now admitted w/ suspected DL fracture after multiple falls and recurrent DL infection.  Assessment/Plan:   1.  LVAD Driveline infection: MRSA driveline infection, admitted in 6/23 and again in 7/23-8/23 with multiple trips to the OR for debridement.  Cultures have grown acinetobacter and MRSA. He was readmitted with driveline infection in 2/24, completed course of daptomycin for MRSA. Now readmitted w/ suspected DL fracture and DL infection. P/w "driveline communication fault" error message on LVAD.LVAD coordinator replaced modular cable and controller with resolution of driveline communication fault signal. CT C/A/P c/w prior imaging w/ similar appearance of mild circumferential thickening of the fat plane surrounding the drive line in the upper abdomen extending from the pump to the skin which may be chronic or represent an infectious process. No drainable fluid collection.  - Started on  empiric daptomycin/Unasyn per ID. Initial wound Cx + for Pseudomonas Aeruginosa (pan-sensitive) and S. aureus. Now on Dapto, Cefepime and minocycline. BCx NGTD. Taken to OR 7/31 for wound debridement + application of wound vac. Returned to OR 8/7 for wound wash out/debridement - Continue abx per ID. Appreciate their assistance. ID recommends 4 weeks of IV antibiotics but reasessing rediness to switch to orals at 2 weeks (on 08/23). Would have significant concern about him receiving IV abx at home with history of drug use and noncompliance.  - Increased pain and drainage at driveline site today. Will discuss with VAD coordinator.  2. Suspected DL fracture with multiple recent falls - Has had multiple falls during the last 2 months (prior to admit), one direct trauma to mid sternum (fell on cinder block) - CT  chest/abd/pelvis per above  - CXR w/ LVAD partially imaged. The associated lead noted to be intact w/ no fracture or kink  - LVAD coordinators changed modular cable and controller with resolution  - No alarms   3.  Chronic systolic CHF s/p LVAD: Echo 10/21 with EF 20-25%, mildly decreased RV function. LHC/RHC in 12/21 with patent grafts, low output. Suspect mixed ischemic/nonischemic cardiomyopathy (prior heavy ETOH and drugs as well as CAD). Admitted with cardiogenic shock in 12/21, had placement of Impella 5.5 initially, now s/p Heartmate 3 LVAD on 11/17/20.  Ramp echo 2/23 with speed decreased to 5500 rpm. Speed now back to 5600 rpm.  He is not volume overloaded on exam. LVAD parameters stable.  - Continue current Toprol XL 25 mg daily.  - MAPs higher in 90s this am, ? D/t pain. Continue losartan 50 mg daily (just started increased dose on 08/20) and follow closely. - Did not tolerate Entresto in the past d/t AKI and low MAPs - Continue spironolactone 25 mg daily.   - INR 1.1 on admission. Suspect noncompliant with Warfarin. - INR 2.2 today. Dosing per PharmD   4. CKD stage 3: Prior CTA  abdomen/pelvis with high-grade stenosis of proximal right renal artery.  Creatinine stable 0.92 today  5.  CAD: S/p CABG 12/20.  LHC pre-VAD with patent grafts, no target for intervention. - Continue Crestor 20 mg daily.   6.  Atrial fibrillation: Paroxysmal.  S/p Maze and LA appendage clip with CABG in 12/20.  Now in NSR.  - Continue Toprol XL 25 mg daily.  - Off amiodarone with hyperthyroidism.  7. Type 2 diabetes: improved control, Hgb A1c down from 12.1>>6.3  - continue SSI   8. 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 He says last use of methamphetamine was > 2 months ago. - UDS this admit negative (however collected > 48 hrs post admit)  - Declines substance use resources  9. 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  Followed by endocrinology. He is now off amiodarone. He had been on methimazole but has not been taking recently. TSH was high on admit and free T4 normal  - Will NOT restart methimazole   10. Anemia (chronic blood loss):  Hgb stable.   11. COPD: Prior smoker.  - Continue Trelegy.   12. PVCs/NSVT:  - Now off amiodarone.   13.  Depression: Has turned to substance abuse to cope.   - Has been referred to OP psych in [place]   14.  Gout: Continue allopurinol, he has prn colchicine.   15.  HTN:  - Monitor closely, if BP remains an issue may need reassessment of renal artery stenosis and possible treatment. Significant calcifications in both renal arteries on CT abdomen this admit. - MAPs higher this am but wonder if d/t more pain. Continue to monitor - Continue Amlodipine 10 mg.  - Continue losartan 50 mg daily (increased dose started yesterday). - Continue spironolactone 25 mg daily.   16. Agitation: Klonopin increased, agitation improved  17. Incisional hernia: Large abdominal wall hernia in close proximity to driveline. Cannot tolerate wearing abdominal binder.  Has chronic discomfort from the hernia and the driveline.  It is  not incarcerated.  - Continue oxycodone at low dose. Has substance abuse history but really pretty miserable in hospital currently.  - No good option for hernia repair given driveline proximity.   I reviewed the LVAD parameters from today, and compared the results to the patient's prior recorded data.  No  programming changes were made.  The LVAD is functioning within specified parameters.  The patient performs LVAD self-test daily.  LVAD interrogation was negative for any significant power changes, alarms or PI events/speed drops.  LVAD equipment check completed and is in good working order.  Back-up equipment present.   LVAD education done on emergency procedures and precautions and reviewed exit site care.  Length of Stay: [address] Dalbert Garnet, PA-C 07/30/2023, 8:40 AM  VAD Team --- VAD ISSUES ONLY--- Pager [phone] (7am - 7am)  Advanced Heart Failure Team  Pager [phone] (M-F; 7a - 5p)  Please contact [facility] for night-coverage after hours (5p -7a ) and weekends on [email or web address]

## 2023-07-30 NOTE — Progress Notes (Signed)
13 Days Post-Op Procedure(s) (LRB): DRIVELINE WOUND DEBRIDEMENT (N/A) Subjective: Patient examined, medical record reviewed Patient complains of mid abdominal pain offset from the surgical wound.  He spent the night without his abdominal binder on.  He denies constipation, nausea, vomiting.  He is afebrile with a normal white count. His INR has reached therapeutic 2.3. He has 2 more days of IV antibiotics planned.  Objective: Vital signs in last 24 hours: Temp:  [98 F (36.7 C)-99.3 F (37.4 C)] 98.8 F (37.1 C) (08/21 0731) Pulse Rate:  [80-88] 82 (08/21 0731) Cardiac Rhythm: Normal sinus rhythm (08/21 0731) Resp:  [14-18] 17 (08/21 0731) BP: (85-119)/(70-91) 107/91 (08/21 0731) SpO2:  [92 %-99 %] 99 % (08/21 0731) Weight:  [93.8 kg] 93.8 kg (08/21 0630)  Hemodynamic parameters for last 24 hours:    Intake/Output from previous day: 08/20 0701 - 08/21 0700 In: 366 [IV Piggyback:366] Out: -  Intake/Output this shift: No intake/output data recorded.  Exam  Patient alert and appropriate Lungs clear Normal VAD hum Abdomen with normal level of distention, diminished breath sounds, no guarding Neurologic intact  Lab Results: Recent Labs    07/29/23 0557 07/30/23 0702  WBC 6.4 7.0  HGB 10.4* 11.1*  HCT 31.8* 34.3*  PLT 155 173   BMET:  Recent Labs    07/29/23 0557 07/30/23 0702  NA 135 134*  K 3.8 4.0  CL 98 100  CO2 26 22  GLUCOSE 143* 151*  BUN 20 21  CREATININE 1.07 0.92  CALCIUM 8.7* 9.0    PT/INR:  Recent Labs    07/30/23 0702  LABPROT 24.4*  INR 2.2*   ABG    Component Value Date/Time   PHART 7.508 (H) 11/20/2020 0438   HCO3 24.8 09/09/2021 0313   TCO2 29 11/20/2020 0438   ACIDBASEDEF 6.0 (H) 11/24/2019 1718   O2SAT 63.4 09/09/2021 0313   CBG (last 3)  Recent Labs    07/29/23 1609 07/29/23 2139 07/30/23 0620  GLUCAP 195* 153* 132*    Assessment/Plan: S/P Procedure(s) (LRB): DRIVELINE WOUND DEBRIDEMENT (N/A) Patient's VAD tunnel  wound has stable drainage controlled with daily dressing changes using Vashe wound solution. The patient is not a candidate for further surgical debridement due to the high risk of entering the outflow graft. KUB is pending to assess for partial bowel obstruction versus ileus. Continue Coumadin dosing and IV antibiotics.   LOS: 23 days    Lovett Sox 07/30/2023

## 2023-07-30 NOTE — Progress Notes (Addendum)
I was made aware that patient was requesting to discharge home with hospice.  VAD coordinator and myself met with patient.   Reports he is having severe 9/10 pain at driveline site. He states he can't function at home with current pain level. He is tearful. Admits depression is also an issue.   He states that he would not want to pursue hospice if he could get pain under control.  Oxycodone was increased to q 4 hrs this am. He has PRN tramadol which he hasn't been using. Will try to stagger dosing of tramadol and oxycodone to better control pain.   Arrange for outpatient pain management and psychiatry at discharge.

## 2023-07-30 NOTE — Progress Notes (Signed)
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 Started on IV antibiotics.   Patient presented to VAD clinic 7/29 for driveline communication fault- stating that he has had "alarms every couple hrs from his VAD for a couple months." Pt has no showed and cancelled all appointments in the past 2 months. Pt also had green/blue drainage visible through his driveline bandage. (Dressing change and wound cx obtained). Pt states that he has experienced dizziness associated with a fall multiple times over the past couple months but no signs of bleeding. Controller and modular cable exchanged. No further communication fault alarms noted.   CT Chest/Abd/Pelvis 7/29 1. Similar appearance of mild circumferential thickening of the fat plane surrounding the drive line in the upper abdomen extending from the pump to the skin which may be chronic or represent an infectious process. No drainable fluid collection.  Pt had drive line debridement in OR with placement of instillation wound vac on 07/09/23 with Dr Donata Clay. This was change to wet/dry on Friday 8/2 due to ongoing alarms related to the pts hernia.   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 Currently on IV Cefepime Q 8 hrs and IV Daptomycin 800 mg daily per ID team. ID team is recommending 4 weeks IV antibiotics. Pt is refusing to go a facility for IV antibiotics.   Pt lying in bed upon my arrival this afternoon tearful due to pain at drive line site. Pt states " he is going home on hospice for pain management". VAD Coordinator explored reasoning for request of hospice at this time and he states his wife Kennith [facility] suggested it for pain management. Discussed with Lillia Abed PA and Dr. Maren Beach. In depth conversation held at bedside with PA, VAD Coordinator and pt about hospice and how his infection can be controlled with proper follow up and  attentive care and compliance with antibiotics. Pt states if pain was under better control he would not want to pursue hospice. Lillia Abed PA discussed with bedside nurse staggering Oxycodone and Tramadol for better control of breakthrough pain  Will plan to keep hospitalized while completing IV antibiotics. Tentative plan to switch to oral antibiotics 8/23.  Vital signs: Temp: 98.5 HR: 81 Doppler Pressure: 90 Automatic BP: 95/82 (87) O2 Sat: 95% on RA Wt: 193.3>194.8>196.4>194.7>...>205.5>206.6>207.8>209.2>211.4>209.2>206.8 lbs  LVAD interrogation reveals:  Speed: 5600 Flow: 4.6 Power: 4.2 w PI: 2.9 Alarms: none Events: 40 PI events Hct: 32  Fixed speed:  5600 Low speed limit:  5300  Drive Line: Existing VAD dressing removed and site care performed using sterile technique. Wound bed cleansed with VASHE solution and sterile 4x4 gauze through light manual debridement. Skin surrounding wound bed cleaned with Chlora prep applicators x 2, allowed to dry. VASHE moistened 4 x 4 placed in wound bed, covered with several dry 4 x 4s. Wound tunnels approx 2 cm.  Drive line unincorporated. Moderate amount of thick yellow/green drainage noted. No redness, tenderness, foul odor or rash noted. There is a skin tear from the tegaderm.  Drive line anchor secured.   Continue daily wet to dry dressing changes using VASHE solution per bedside RN or VAD coordinator. Next dressing change due 07/31/23 by bedside nurse.    Labs:  LDH trend: 121>133>130>138>172>175>165>158>169>155>170>167>173>161>163>156>165  INR trend: 1.0>1.1>1.2>1.4>1.3>1.4>1.5>1.4>1.8>1.9>1.7>1.8>1.9>2.1>2.2  Anticoagulation Plan: -INR Goal: 2.0 - 2.5 -ASA Dose: none  ICD: N/A  Infection:  07/07/23>> blood cultures>> no growth 5 days; final  07/07/23>>  wound cx>> few pseudomonas aeruginosa and MRSA; final  07/09/23>> OR driveline cx>> Few pseudomonas aeruginosa and METHICILLIN RESISTANT STAPHYLOCOCCUS AUREUS 07/09/23>> blood  cultures>>no growth x 5 days; final  Drips:    Plan/Recommendations:  Page VAD coordinator with equipment issues or driveline problems Daily drive line wet to dry dressing changes with VASHE solution.   Simmie Davies RN,BSN VAD Coordinator  Office: [phone]  24/7 Pager: [phone]

## 2023-07-30 NOTE — TOC Progression Note (Signed)
Transition of Care (TOC) - Progression Note    Patient Details  Name: Kevin Underwood. MRN: [id number] Date of Birth: 05/18/1961  Transition of Care [facility]) CM/SW Contact  Nicanor Bake Phone Number: [phone] 07/30/2023, 12:03 PM  Clinical Narrative: CSW met with the pts wife to give her a list of  home hospice options. Wife stated that is what the pt told her he wanted.   CSW met with the pt at bedside and presented a list of home hospice options. CSW asked the pt if that was the route he was considering. Pt stated yes. CSW asked the pt to take some time to look over some things and informed him that the CSW would follow up about the pt and family choice. CSW explained next steps to pt and shared there would be a follow up. TOC will continue following.      Expected Discharge Plan: Skilled Nursing Facility Barriers to Discharge: Continued Medical Work up  Expected Discharge Plan and Services   Discharge Planning Services: CM Consult Post Acute Care Choice: Skilled Nursing Facility Living arrangements for the past 2 months: Single Family Home                                       Social Determinants of Health (SDOH) Interventions SDOH Screenings   Food Insecurity: No Food Insecurity (07/08/2023)  Housing: Low Risk  (07/08/2023)  Transportation Needs: No Transportation Needs (07/08/2023)  Utilities: Not At Risk (07/08/2023)  Alcohol Screen: Low Risk  (11/10/2020)  Depression (PHQ2-9): Low Risk  (10/16/2022)  Financial Resource Strain: Medium Risk (05/17/2022)  Physical Activity: Inactive (11/10/2020)  Tobacco Use: Medium Risk (07/09/2023)    Readmission Risk Interventions    07/01/2022    9:05 AM 11/28/2020   11:57 AM  Readmission Risk Prevention Plan  Transportation Screening Complete Complete  Medication Review Oceanographer) Complete Complete  PCP or Specialist appointment within 3-5 days of discharge Complete   HRI or Home Care Consult Complete  Complete  SW Recovery Care/Counseling Consult Complete Complete  Palliative Care Screening Not Applicable Not Applicable  Skilled Nursing Facility Not Applicable Not Applicable

## 2023-07-30 NOTE — TOC Progression Note (Signed)
Transition of Care (TOC) - Progression Note    Patient Details  Name: Kevin Underwood. MRN: [id number] Date of Birth: [birth year]  Transition of Care [facility]) CM/SW Contact  Nicanor Bake Phone Number: [phone] 07/30/2023, 11:16 AM  Clinical Narrative:  HF CSW and CM met with pts wife. CSW gave the pts wife a list of HH options to choose from.  Pts wife stated that she has concerns about HH because in the past he was declined due to SA issues. CSW and CM explained to the pts wife that due to SA concerns that support could be challenging. Pts wife stated that she has been caring for the pt but his habits make it difficult. Pts wife asked about home hospice and the difference between the two. CSW and CM discussed what the alternative options and what home hospice would do. CSW and CM encouraged the pts wife to have a serious discussion with the pt about his habits and choices. CSW will follow up with the pt and family about choice. TOC will continue to follow.     Expected Discharge Plan: Skilled Nursing Facility Barriers to Discharge: Continued Medical Work up  Expected Discharge Plan and Services   Discharge Planning Services: CM Consult Post Acute Care Choice: Skilled Nursing Facility Living arrangements for the past 2 months: Single Family Home                                       Social Determinants of Health (SDOH) Interventions SDOH Screenings   Food Insecurity: No Food Insecurity (07/08/2023)  Housing: Low Risk  (07/08/2023)  Transportation Needs: No Transportation Needs (07/08/2023)  Utilities: Not At Risk (07/08/2023)  Alcohol Screen: Low Risk  (11/10/2020)  Depression (PHQ2-9): Low Risk  (10/16/2022)  Financial Resource Strain: Medium Risk (05/17/2022)  Physical Activity: Inactive (11/10/2020)  Tobacco Use: Medium Risk (07/09/2023)    Readmission Risk Interventions    07/01/2022    9:05 AM 11/28/2020   11:57 AM  Readmission Risk Prevention Plan   Transportation Screening Complete Complete  Medication Review Oceanographer) Complete Complete  PCP or Specialist appointment within 3-5 days of discharge Complete   HRI or Home Care Consult Complete Complete  SW Recovery Care/Counseling Consult Complete Complete  Palliative Care Screening Not Applicable Not Applicable  Skilled Nursing Facility Not Applicable Not Applicable

## 2023-07-30 NOTE — Progress Notes (Signed)
[facility]  Date of Admission:  07/07/2023     Total days of antibiotics 23         ASSESSMENT:  Mr. Kois continues to have pain located at his driveline site and has been tolerating antibiotics with no adverse side effects. Initial plan for induction with 4 weeks with daptomycin and cefepime and then possible transition to oral medications. CK levels stable. At this time discussed the plan to continue with daptomycin and cefepime in addition to the minocycline for the remaining 2 weeks. Continue LVAD care per LVAD/HF team.   PLAN:  Continue current dose of daptomycin, cefepime and minocycline for 2 weeks prior to switching to oral medication.  Therapeutic drug monitoring of CK levels.  LVAD care per HF/LVAD team.  Remaining medical and supportive care per Primary Team.    I have personally spent 22 minutes involved in face-to-face and non-face-to-face activities for this patient on the day of the visit. Professional time spent includes the following activities: Preparing to see the patient (review of tests), Obtaining and/or reviewing separately obtained history (admission/discharge record), Performing a medically appropriate examination and/or evaluation , Ordering medications/tests/procedures, referring and communicating with other health care professionals, Documenting clinical information in the EMR, Independently interpreting results (not separately reported), Communicating results to the patient/family/caregiver, Counseling and educating the patient/family/caregiver and Care coordination (not separately reported).   Principal Problem:   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 (LVAD) (HCC)    allopurinol  100 mg Oral Daily   amLODipine  10 mg Oral Daily   clonazepam  0.5 mg Oral BID   fluticasone furoate-vilanterol  1 puff Inhalation Daily   And   umeclidinium bromide  1 puff Inhalation Daily   gabapentin  300 mg  Oral BID   insulin aspart  0-20 Units Subcutaneous TID WC   insulin aspart  0-5 Units Subcutaneous QHS   insulin glargine-yfgn  14 Units Subcutaneous Daily   losartan  50 mg Oral Daily   metoprolol succinate  25 mg Oral Daily   minocycline  200 mg Oral BID   mirtazapine  15 mg Oral QHS   pantoprazole  40 mg Oral Daily   sertraline  50 mg Oral Daily   spironolactone  25 mg Oral Daily   tamsulosin  0.4 mg Oral Daily   thiamine  100 mg Oral Daily   traZODone  150 mg Oral QHS   warfarin  6 mg Oral q1600   Warfarin - Pharmacist Dosing Inpatient   Does not apply q1600   zinc sulfate  220 mg Oral Daily    SUBJECTIVE:  Afebrile overnight with no acute events. Wanting to leave.   Allergies  Allergen Reactions   Vancomycin Nausea And Vomiting and Other (See Comments)    Severe joint pain     Review of Systems: Review of Systems  Constitutional:  Negative for chills, fever and weight loss.  Respiratory:  Negative for cough, shortness of breath and wheezing.   Cardiovascular:  Negative for chest pain and leg swelling.       Pain at driveline sight.   Gastrointestinal:  Negative for abdominal pain, constipation, diarrhea, nausea and vomiting.  Skin:  Negative for rash.      OBJECTIVE: Vitals:   07/30/23 0630 07/30/23 0731 07/30/23 0922 07/30/23 1114  BP:  (!) 107/91 (!) 108/92 95/82  Pulse:  82 84 93  Resp:  17  17  Temp:  98.8 F (37.1 C)  98.5 F (36.9 C)  TempSrc:  Oral  Oral  SpO2:  99%  95%  Weight: 93.8 kg     Height:       Body mass index is 29.67 kg/m.  Physical Exam Constitutional:      General: He is not in acute distress.    Appearance: He is well-developed.  Cardiovascular:     Rate and Rhythm: Normal rate and regular rhythm.     Comments: LVAD hum  Pulmonary:     Effort: Pulmonary effort is normal.     Breath sounds: Normal breath sounds.  Skin:    General: Skin is warm and dry.  Neurological:     Mental Status: He is alert.     Lab  Results Lab Results  Component Value Date   WBC 7.0 07/30/2023   HGB 11.1 (L) 07/30/2023   HCT 34.3 (L) 07/30/2023   MCV 86.4 07/30/2023   PLT 173 07/30/2023    Lab Results  Component Value Date   CREATININE 0.92 07/30/2023   BUN 21 07/30/2023   NA 134 (L) 07/30/2023   K 4.0 07/30/2023   CL 100 07/30/2023   CO2 22 07/30/2023    Lab Results  Component Value Date   ALT 24 07/07/2023   AST 17 07/07/2023   ALKPHOS 93 07/07/2023   BILITOT 0.3 07/07/2023     Microbiology: No results found for this or any previous visit (from the past 240 hour(s)).   Marcos Eke, NP [facility]  07/30/2023  2:26 PM

## 2023-07-30 NOTE — Plan of Care (Signed)
  Problem: Education: Goal: Patient will understand all VAD equipment and how it functions Outcome: Progressing Goal: Patient will be able to verbalize current INR target range and antiplatelet therapy for discharge home Outcome: Progressing   Problem: Education: Goal: Knowledge of General Education information will improve Description: Including pain rating scale, medication(s)/side effects and non-pharmacologic comfort measures Outcome: Progressing   Problem: Clinical Measurements: Goal: Ability to maintain clinical measurements within normal limits will improve Outcome: Progressing Goal: Will remain free from infection Outcome: Progressing Goal: Respiratory complications will improve Outcome: Progressing

## 2023-07-30 NOTE — Plan of Care (Signed)
  Problem: Education: Goal: Patient will understand all VAD equipment and how it functions Outcome: Progressing Goal: Patient will be able to verbalize current INR target range and antiplatelet therapy for discharge home Outcome: Progressing   Problem: Cardiac: Goal: LVAD will function as expected and patient will experience no clinical alarms Outcome: Progressing   Problem: Education: Goal: Knowledge of General Education information will improve Description: Including pain rating scale, medication(s)/side effects and non-pharmacologic comfort measures Outcome: Progressing   Problem: Health Behavior/Discharge Planning: Goal: Ability to manage health-related needs will improve Outcome: Progressing   Problem: Clinical Measurements: Goal: Ability to maintain clinical measurements within normal limits will improve Outcome: Progressing Goal: Will remain free from infection Outcome: Progressing Goal: Diagnostic test results will improve Outcome: Progressing Goal: Respiratory complications will improve Outcome: Progressing Goal: Cardiovascular complication will be avoided Outcome: Progressing   Problem: Activity: Goal: Risk for activity intolerance will decrease Outcome: Progressing   Problem: Nutrition: Goal: Adequate nutrition will be maintained Outcome: Progressing   Problem: Coping: Goal: Level of anxiety will decrease Outcome: Progressing   Problem: Elimination: Goal: Will not experience complications related to bowel motility Outcome: Progressing Goal: Will not experience complications related to urinary retention Outcome: Progressing   Problem: Pain Managment: Goal: General experience of comfort will improve Outcome: Progressing   Problem: Safety: Goal: Ability to remain free from injury will improve Outcome: Progressing   Problem: Skin Integrity: Goal: Risk for impaired skin integrity will decrease Outcome: Progressing   Problem: Education: Goal: Ability  to describe self-care measures that may prevent or decrease complications (Diabetes Survival Skills Education) will improve Outcome: Progressing Goal: Individualized Educational Video(s) Outcome: Progressing   Problem: Coping: Goal: Ability to adjust to condition or change in health will improve Outcome: Progressing   Problem: Fluid Volume: Goal: Ability to maintain a balanced intake and output will improve Outcome: Progressing   Problem: Health Behavior/Discharge Planning: Goal: Ability to identify and utilize available resources and services will improve Outcome: Progressing Goal: Ability to manage health-related needs will improve Outcome: Progressing   Problem: Metabolic: Goal: Ability to maintain appropriate glucose levels will improve Outcome: Progressing   Problem: Nutritional: Goal: Maintenance of adequate nutrition will improve Outcome: Progressing Goal: Progress toward achieving an optimal weight will improve Outcome: Progressing   Problem: Skin Integrity: Goal: Risk for impaired skin integrity will decrease Outcome: Progressing   Problem: Tissue Perfusion: Goal: Adequacy of tissue perfusion will improve Outcome: Progressing   

## 2023-07-31 ENCOUNTER — Inpatient Hospital Stay (HOSPITAL_COMMUNITY): Payer: 59

## 2023-07-31 DIAGNOSIS — T827XXA Infection and inflammatory reaction due to other cardiac and vascular devices, implants and grafts, initial encounter: Secondary | ICD-10-CM | POA: Diagnosis not present

## 2023-07-31 LAB — BASIC METABOLIC PANEL
Anion gap: 11 (ref 5–15)
BUN: 24 mg/dL — ABNORMAL HIGH (ref 8–23)
CO2: 25 mmol/L (ref 22–32)
Calcium: 9.2 mg/dL (ref 8.9–10.3)
Chloride: 101 mmol/L (ref 98–111)
Creatinine, Ser: 0.96 mg/dL (ref 0.61–1.24)
GFR, Estimated: >60 mL/min (ref >60)
Glucose, Bld: 158 mg/dL — ABNORMAL HIGH (ref 70–99)
Potassium: 3.8 mmol/L (ref 3.5–5.1)
Sodium: 137 mmol/L (ref 135–145)

## 2023-07-31 LAB — CBC
HCT: 36.3 % — ABNORMAL LOW (ref 39.0–52.0)
Hemoglobin: 11.7 g/dL — ABNORMAL LOW (ref 13.0–17.0)
MCH: 28.1 pg (ref 26.0–34.0)
MCHC: 32.2 g/dL (ref 30.0–36.0)
MCV: 87.1 fL (ref 80.0–100.0)
Platelets: 212 10*3/uL (ref 150–400)
RBC: 4.17 MIL/uL — ABNORMAL LOW (ref 4.22–5.81)
RDW: 14.4 % (ref 11.5–15.5)
WBC: 6.7 10*3/uL (ref 4.0–10.5)
nRBC: 0 % (ref 0.0–0.2)

## 2023-07-31 LAB — GLUCOSE, CAPILLARY
Glucose-Capillary: 124 mg/dL — ABNORMAL HIGH (ref 70–99)
Glucose-Capillary: 119 mg/dL — ABNORMAL HIGH (ref 70–99)
Glucose-Capillary: 153 mg/dL — ABNORMAL HIGH (ref 70–99)
Glucose-Capillary: 150 mg/dL — ABNORMAL HIGH (ref 70–99)

## 2023-07-31 LAB — LACTATE DEHYDROGENASE: LDH: 168 U/L (ref 98–192)

## 2023-07-31 LAB — PROTIME-INR
INR: 2.1 — ABNORMAL HIGH (ref 0.8–1.2)
Prothrombin Time: 23.8 s — ABNORMAL HIGH (ref 11.4–15.2)

## 2023-07-31 LAB — MAGNESIUM: Magnesium: 1.9 mg/dL (ref 1.7–2.4)

## 2023-07-31 MED ORDER — SORBITOL 70 % SOLN
30.0000 mL | Freq: Once | Status: AC
  Administered 2023-07-31: 30 mL via ORAL
  Filled 2023-07-31: qty 30

## 2023-07-31 MED ORDER — POTASSIUM CHLORIDE CRYS ER 20 MEQ PO TBCR
20.0000 meq | EXTENDED_RELEASE_TABLET | Freq: Once | ORAL | Status: AC
  Administered 2023-07-31: 20 meq via ORAL
  Filled 2023-07-31: qty 1

## 2023-07-31 MED ORDER — MAGNESIUM SULFATE 2 GM/50ML IV SOLN
2.0000 g | Freq: Once | INTRAVENOUS | Status: AC
  Administered 2023-07-31: 2 g via INTRAVENOUS
  Filled 2023-07-31: qty 50

## 2023-07-31 MED ORDER — OXYCODONE HCL 5 MG PO TABS
7.5000 mg | ORAL_TABLET | ORAL | Status: DC | PRN
  Administered 2023-07-31 – 2023-08-03 (×12): 7.5 mg via ORAL
  Filled 2023-07-31 (×12): qty 2

## 2023-07-31 NOTE — Plan of Care (Signed)
  Problem: Education: Goal: Patient will understand all VAD equipment and how it functions Outcome: Progressing Goal: Patient will be able to verbalize current INR target range and antiplatelet therapy for discharge home Outcome: Progressing   Problem: Cardiac: Goal: LVAD will function as expected and patient will experience no clinical alarms Outcome: Progressing   Problem: Education: Goal: Knowledge of General Education information will improve Description: Including pain rating scale, medication(s)/side effects and non-pharmacologic comfort measures Outcome: Progressing   Problem: Health Behavior/Discharge Planning: Goal: Ability to manage health-related needs will improve Outcome: Progressing   Problem: Clinical Measurements: Goal: Ability to maintain clinical measurements within normal limits will improve Outcome: Progressing Goal: Will remain free from infection Outcome: Progressing Goal: Diagnostic test results will improve Outcome: Progressing Goal: Respiratory complications will improve Outcome: Progressing Goal: Cardiovascular complication will be avoided Outcome: Progressing   Problem: Activity: Goal: Risk for activity intolerance will decrease Outcome: Progressing   Problem: Nutrition: Goal: Adequate nutrition will be maintained Outcome: Progressing   Problem: Coping: Goal: Level of anxiety will decrease Outcome: Progressing   Problem: Elimination: Goal: Will not experience complications related to bowel motility Outcome: Progressing Goal: Will not experience complications related to urinary retention Outcome: Progressing   Problem: Pain Managment: Goal: General experience of comfort will improve Outcome: Progressing   Problem: Safety: Goal: Ability to remain free from injury will improve Outcome: Progressing   Problem: Skin Integrity: Goal: Risk for impaired skin integrity will decrease Outcome: Progressing   Problem: Education: Goal: Ability  to describe self-care measures that may prevent or decrease complications (Diabetes Survival Skills Education) will improve Outcome: Progressing Goal: Individualized Educational Video(s) Outcome: Progressing   Problem: Coping: Goal: Ability to adjust to condition or change in health will improve Outcome: Progressing   Problem: Fluid Volume: Goal: Ability to maintain a balanced intake and output will improve Outcome: Progressing   Problem: Health Behavior/Discharge Planning: Goal: Ability to identify and utilize available resources and services will improve Outcome: Progressing Goal: Ability to manage health-related needs will improve Outcome: Progressing   Problem: Metabolic: Goal: Ability to maintain appropriate glucose levels will improve Outcome: Progressing   Problem: Nutritional: Goal: Maintenance of adequate nutrition will improve Outcome: Progressing Goal: Progress toward achieving an optimal weight will improve Outcome: Progressing   Problem: Skin Integrity: Goal: Risk for impaired skin integrity will decrease Outcome: Progressing   Problem: Tissue Perfusion: Goal: Adequacy of tissue perfusion will improve Outcome: Progressing   

## 2023-07-31 NOTE — Progress Notes (Signed)
14 Days Post-Op Procedure(s) (LRB): DRIVELINE WOUND DEBRIDEMENT (N/A) Subjective: Patient examined, issues regarding his abdominal pain and management as well as his depression discussed at the bedside.  After a lengthy discussion of these issues patient agrees for discharge home tomorrow on oral antibiotic, he will use his abdominal binder daily going forward, and perform daily dressing changes with Vashe wound solution wet-to-dry at the VAD exit site.  He will be given a supply of oral oxycodone for pain management and because it has been a chronic issue we will arrange evaluation at a pain clinic.  Objective: Vital signs in last 24 hours: Temp:  [97.6 F (36.4 C)-98 F (36.7 C)] 97.6 F (36.4 C) (08/22 0709) Pulse Rate:  [65-89] 89 (08/22 0729) Cardiac Rhythm: Normal sinus rhythm (08/22 0701) Resp:  [13-19] 19 (08/22 0729) BP: (90-122)/(78-98) 119/80 (08/22 0709) SpO2:  [93 %-98 %] 98 % (08/22 0729) Weight:  [94.5 kg] 94.5 kg (08/22 0612)  Hemodynamic parameters for last 24 hours:  Afebrile, sinus rhythm  Intake/Output from previous day: 08/21 0701 - 08/22 0700 In: 120 [P.O.:120] Out: -  Intake/Output this shift: No intake/output data recorded.  Exam Patient sitting up in bed, ate his breakfast and is more engaged. Abdominal binder replaced with patient. Abdominal wound dressing is clean and dry after 1 day. Normal VAD hum with sinus rhythm Lab Results: Recent Labs    07/30/23 0702 07/31/23 0543  WBC 7.0 6.7  HGB 11.1* 11.7*  HCT 34.3* 36.3*  PLT 173 212   BMET:  Recent Labs    07/30/23 0702 07/31/23 0543  NA 134* 137  K 4.0 3.8  CL 100 101  CO2 22 25  GLUCOSE 151* 158*  BUN 21 24*  CREATININE 0.92 0.96  CALCIUM 9.0 9.2    PT/INR:  Recent Labs    07/31/23 0543  LABPROT 23.8*  INR 2.1*   ABG    Component Value Date/Time   PHART 7.508 (H) 11/20/2020 0438   HCO3 24.8 09/09/2021 0313   TCO2 29 11/20/2020 0438   ACIDBASEDEF 6.0 (H) 11/24/2019 1718    O2SAT 63.4 09/09/2021 0313   CBG (last 3)  Recent Labs    07/30/23 1557 07/30/23 2130 07/31/23 0609  GLUCAP 228* 216* 124*    Assessment/Plan: S/P Procedure(s) (LRB): DRIVELINE WOUND DEBRIDEMENT (N/A) Plan DC to home tomorrow with outpatient care as described above and weekly clinic visits Referral to pain management clinic  LOS: 24 days    Lovett Sox 07/31/2023

## 2023-07-31 NOTE — Progress Notes (Signed)
Patient ID: Kevin Rigg., male   DOB: Oct 18, 1961, 62 y.o.   MRN: [id number]   Advanced Heart Failure VAD Team Note  PCP-Cardiologist: Nona Dell, MD  [facility]: Dr. Shirlee Latch   Subjective:    7/29: admitted w/ suspected DL fracture and DL infection. P/w "driveline communication fault" error message on LVAD. No pump stoppages. LVAD coordinator replaced modular cable and controller with resolution of driveline communication fault signal. CT C/A/P c/w prior imaging w/ similar appearance of mild circumferential thickening of the fat plane surrounding the drive line in the upper abdomen extending from the pump to the skin which may be chronic or represent an infectious process. No drainable fluid collection. Wound + BCx collected. Started on empiric daptomycin/Unasyn per ID.  7/30: Wound Cx growing Pseudomonas Aeruginosa (pan-sensitive) and S. aureus. Cefepime added 7/31: OR for wound debridement + application of wound vac.   8/8: Returned to OR for I&D  Remains on Daptomycin, Cefepime and minocycline. Surgical wound with pseudomonas aeruginosa and MRSA. Bcx from 7/29 and 7/31 NGTD. Plan to complete IV abx through 8/23.  MAPs 90s.  Pain improving. Willing to ambulate today.   LVAD INTERROGATION:  HeartMate III LVAD:   Flow 4.7 liters/min, speed 5650 power 4.2, PI  2.4  Objective:    Vital Signs:   Temp:  [97.6 F (36.4 C)-98.5 F (36.9 C)] 97.6 F (36.4 C) (08/22 0709) Pulse Rate:  [65-93] 89 (08/22 0729) Resp:  [13-19] 19 (08/22 0729) BP: (90-122)/(78-98) 119/80 (08/22 0709) SpO2:  [93 %-98 %] 98 % (08/22 0729) Weight:  [94.5 kg] 94.5 kg (08/22 0612) Last BM Date : 07/30/23 Mean arterial Pressure 90s Intake/Output:   Intake/Output Summary (Last 24 hours) at 07/31/2023 0921 Last data filed at 07/30/2023 2021 Gross per 24 hour  Intake 120 ml  Output --  Net 120 ml    Physical Exam  General:  Well appearing. No resp difficulty HEENT: Normal Neck: supple. JVP flat. Carotids  2+ bilat; no bruits. No lymphadenopathy or thyromegaly appreciated. Cor: Mechanical heart sounds with LVAD hum present. Lungs: Clear Abdomen: soft, nontender, distended around hernia. No hepatosplenomegaly. No bruits or masses. Good bowel sounds. Large abdominal hernia Driveline: C/D/I; securement device intact. Dressing around DL exit site.  Extremities: no cyanosis, clubbing, rash, edema Neuro: alert & orientedx3, cranial nerves grossly intact. moves all 4 extremities w/o difficulty. Affect pleasant  Telemetry   NSR 80s (Personally reviewed)    Labs   Basic Metabolic Panel: Recent Labs  Lab 07/27/23 0724 07/28/23 0139 07/29/23 0557 07/30/23 0702 07/31/23 0543  NA 135 136 135 134* 137  K 3.9 3.9 3.8 4.0 3.8  CL 102 101 98 100 101  CO2 23 27 26 22 25   GLUCOSE 158* 192* 143* 151* 158*  BUN 19 18 20 21  24*  CREATININE 1.08 1.05 1.07 0.92 0.96  CALCIUM 8.7* 9.0 8.7* 9.0 9.2  MG 1.8 1.7 1.7 1.7 1.9    Liver Function Tests: No results for input(s): "AST", "ALT", "ALKPHOS", "BILITOT", "PROT", "ALBUMIN" in the last 168 hours.  CBC: Recent Labs  Lab 07/27/23 0724 07/28/23 0139 07/29/23 0557 07/30/23 0702 07/31/23 0543  WBC 5.8 5.9 6.4 7.0 6.7  HGB 10.0* 10.2* 10.4* 11.1* 11.7*  HCT 31.0* 31.8* 31.8* 34.3* 36.3*  MCV 86.4 85.3 85.3 86.4 87.1  PLT 157 163 155 173 212    INR: Recent Labs  Lab 07/27/23 0724 07/28/23 0139 07/29/23 0557 07/30/23 0702 07/31/23 0543  INR 2.0* 1.9* 2.1* 2.2* 2.1*  Other results:    Imaging   DG Chest Port 1 View  Result Date: 07/30/2023 CLINICAL DATA:  Left ventricular assist device. EXAM: PORTABLE CHEST 1 VIEW COMPARISON:  July 16, 2023. FINDINGS: Stable cardiomediastinal silhouette. Left ventricular assist device is noted. Sternotomy wires are noted. New left upper lobe airspace opacity is noted concerning for possible pneumonia. Right lung is clear. Old left clavicular fracture is noted. IMPRESSION: New left upper lobe  airspace opacity is noted concerning for possible pneumonia. Electronically Signed   By: Lupita Raider M.D.   On: 07/30/2023 11:33   DG Abd Portable 1V  Result Date: 07/30/2023 CLINICAL DATA:  Left ventricular assist device.  Abdominal pain. EXAM: PORTABLE ABDOMEN - 1 VIEW COMPARISON:  None Available. FINDINGS: The bowel gas pattern is normal. Postsurgical changes are seen in the lumbar spine. Left ventricular assist device is noted. IMPRESSION: No abnormal bowel dilatation. Electronically Signed   By: Lupita Raider M.D.   On: 07/30/2023 11:31     Medications:     Scheduled Medications:  allopurinol  100 mg Oral Daily   amLODipine  10 mg Oral Daily   clonazepam  0.5 mg Oral BID   fluticasone furoate-vilanterol  1 puff Inhalation Daily   And   umeclidinium bromide  1 puff Inhalation Daily   gabapentin  300 mg Oral BID   insulin aspart  0-20 Units Subcutaneous TID WC   insulin aspart  0-5 Units Subcutaneous QHS   insulin glargine-yfgn  14 Units Subcutaneous Daily   losartan  50 mg Oral Daily   metoprolol succinate  25 mg Oral Daily   minocycline  200 mg Oral BID   mirtazapine  15 mg Oral QHS   pantoprazole  40 mg Oral Daily   sertraline  50 mg Oral Daily   spironolactone  25 mg Oral Daily   tamsulosin  0.4 mg Oral Daily   thiamine  100 mg Oral Daily   traZODone  150 mg Oral QHS   warfarin  6 mg Oral q1600   Warfarin - Pharmacist Dosing Inpatient   Does not apply q1600   zinc sulfate  220 mg Oral Daily    Infusions:  ceFEPime (MAXIPIME) IV 2 g (07/31/23 0554)   DAPTOmycin (CUBICIN) 800 mg in sodium chloride 0.9 % IVPB 800 mg (07/30/23 1348)    PRN Medications: acetaminophen, fluticasone, oxyCODONE, traMADol   Patient Profile  62 y.o. male with history of systolic HF, multivessel CAD status post CABG in December 2020 (with Maze and LAA clipping) at which point he required Impella support due to cardiogenic shock, paroxysmal atrial fibrillation DM II, COPD, and  hypertension. Now s/p Heartmate 3 LVAD. Now admitted w/ suspected DL fracture after multiple falls and recurrent DL infection.  Assessment/Plan:   1.  LVAD Driveline infection: MRSA driveline infection, admitted in 6/23 and again in 7/23-8/23 with multiple trips to the OR for debridement.  Cultures have grown acinetobacter and MRSA. He was readmitted with driveline infection in 2/24, completed course of daptomycin for MRSA. Now readmitted w/ suspected DL fracture and DL infection. P/w "driveline communication fault" error message on LVAD.LVAD coordinator replaced modular cable and controller with resolution of driveline communication fault signal. CT C/A/P c/w prior imaging w/ similar appearance of mild circumferential thickening of the fat plane surrounding the drive line in the upper abdomen extending from the pump to the skin which may be chronic or represent an infectious process. No drainable fluid collection.  - Started on empiric  daptomycin/Unasyn per ID. Initial wound Cx + for Pseudomonas Aeruginosa (pan-sensitive) and S. aureus. Now on Dapto, Cefepime and minocycline. BCx NGTD. Taken to OR 7/31 for wound debridement + application of wound vac. Returned to OR 8/7 for wound wash out/debridement - Continue abx per ID. Appreciate their assistance. ID recommends 4 weeks of IV antibiotics but reasessing rediness to switch to orals at 2 weeks (on 08/23). ID saw yesterday and recommending patient complete entire 4 weeks of IV abx.  - Pain improving today. Will discuss with VAD coordinator.  2. Suspected DL fracture with multiple recent falls - Has had multiple falls during the last 2 months (prior to admit), one direct trauma to mid sternum (fell on cinder block) - CT chest/abd/pelvis per above  - CXR w/ LVAD partially imaged. The associated lead noted to be intact w/ no fracture or kink  - LVAD coordinators changed modular cable and controller with resolution  - No alarms   3.  Chronic systolic CHF  s/p LVAD: Echo 10/21 with EF 20-25%, mildly decreased RV function. LHC/RHC in 12/21 with patent grafts, low output. Suspect mixed ischemic/nonischemic cardiomyopathy (prior heavy ETOH and drugs as well as CAD). Admitted with cardiogenic shock in 12/21, had placement of Impella 5.5 initially, now s/p Heartmate 3 LVAD on 11/17/20.  Ramp echo 2/23 with speed decreased to 5500 rpm. Speed now back to 5600 rpm.  He is not volume overloaded on exam. LVAD parameters stable.  - Continue current Toprol XL 25 mg daily.  - MAPs 90s, ? D/t pain. Continue losartan 50 mg daily (just started increased dose on 08/20) and follow closely. - Did not tolerate Entresto in the past d/t AKI and low MAPs - Continue spironolactone 25 mg daily.   - INR 1.1 on admission. Suspect noncompliant with Warfarin. - INR 2.1 today. Dosing per PharmD   4. CKD stage 3: Prior CTA abdomen/pelvis with high-grade stenosis of proximal right renal artery.  Creatinine stable 0.96 today  5.  CAD: S/p CABG 12/20.  LHC pre-VAD with patent grafts, no target for intervention. - Continue Crestor 20 mg daily.   6.  Atrial fibrillation: Paroxysmal.  S/p Maze and LA appendage clip with CABG in 12/20.  Now in NSR.  - Continue Toprol XL 25 mg daily.  - Off amiodarone with hyperthyroidism.  7. Type 2 diabetes: improved control, Hgb A1c down from 12.1>>6.3  - continue SSI   8. 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 He says last use of methamphetamine was > 2 months ago. - UDS this admit negative (however collected > 48 hrs post admit)  - Declines substance use resources  9. 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  Followed by endocrinology. He is now off amiodarone. He had been on methimazole but has not been taking recently. TSH was high on admit and free T4 normal  - Will NOT restart methimazole   10. Anemia (chronic blood loss):  Hgb stable.   11. COPD: Prior smoker.  - Continue Trelegy.   12. PVCs/NSVT:  -  Now off amiodarone.   13.  Depression: Has turned to substance abuse to cope.   - Has been referred to OP psych in [place]   14.  Gout: Continue allopurinol, he has prn colchicine.   15.  HTN:  - Monitor closely, if BP remains an issue may need reassessment of renal artery stenosis and possible treatment. Significant calcifications in both renal arteries on CT abdomen this admit. - MAPs higher this am  but wonder if d/t more pain. Continue to monitor - Continue Amlodipine 10 mg.  - Continue losartan 50 mg daily - Continue spironolactone 25 mg daily.   16. Agitation: Klonopin increased, agitation improved  17. Incisional hernia: Large abdominal wall hernia in close proximity to driveline. Cannot tolerate wearing abdominal binder.  Has chronic discomfort from the hernia and the driveline.  It is not incarcerated.  - Continue oxycodone at low dose. Has substance abuse history but really pretty miserable in hospital currently.  - No good option for hernia repair given driveline proximity.   ID now recommending 2 more weeks IV abx to complete the 4 week course. Discharging home with PICC not ideal with substance abuse history. Patient unwilling to go back to SNF. Will have HFSW come talk to patient about his wishes. Discussed hospice and HH today, open to both just needs further teaching.   I reviewed the LVAD parameters from today, and compared the results to the patient's prior recorded data.  No programming changes were made.  The LVAD is functioning within specified parameters.  The patient performs LVAD self-test daily.  LVAD interrogation was negative for any significant power changes, alarms or PI events/speed drops.  LVAD equipment check completed and is in good working order.  Back-up equipment present.   LVAD education done on emergency procedures and precautions and reviewed exit site care.  Length of Stay: 24  Alen Bleacher, NP 07/31/2023, 9:21 AM  VAD Team --- VAD ISSUES  ONLY--- Pager [phone] (7am - 7am)  Advanced Heart Failure Team  Pager [phone] (M-F; 7a - 5p)  Please contact [facility] for night-coverage after hours (5p -7a ) and weekends on [email or web address]

## 2023-07-31 NOTE — Progress Notes (Signed)
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 Started on IV antibiotics.   Patient presented to VAD clinic 7/29 for driveline communication fault- stating that he has had "alarms every couple hrs from his VAD for a couple months." Pt has no showed and cancelled all appointments in the past 2 months. Pt also had green/blue drainage visible through his driveline bandage. (Dressing change and wound cx obtained). Pt states that he has experienced dizziness associated with a fall multiple times over the past couple months but no signs of bleeding. Controller and modular cable exchanged. No further communication fault alarms noted.   CT Chest/Abd/Pelvis 7/29 1. Similar appearance of mild circumferential thickening of the fat plane surrounding the drive line in the upper abdomen extending from the pump to the skin which may be chronic or represent an infectious process. No drainable fluid collection.  Pt had drive line debridement in OR with placement of instillation wound vac on 07/09/23 with Dr Donata Clay. This was change to wet/dry on Friday 8/2 due to ongoing alarms related to the pts hernia.   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 Currently on IV Cefepime Q 8 hrs and IV Daptomycin 800 mg daily per ID team. ID team is recommending 4 weeks IV antibiotics. Pt is refusing to go a facility for IV antibiotics.   Pt lying in bed upon my arrival this afternoon. Plan to discharge home per Dr. Maren Beach tomorrow with daily wet to dry dressings by his wife Kennith [place]. Referral to pain clinic to be placed at discharge to assist with adequate pain management. Discussed importance of compliance with follow up today. CSW at bedside and will try to arrange [facility] for medication management assistance at home.  Vital signs: Temp: 97.9 HR: 77 Doppler Pressure: none documented Automatic BP: 87/67  (75) O2 Sat: 98% on RA Wt: 193.3>194.8>196.4>194.7>...>205.5>206.6>207.8>209.2>211.4>209.2>206.8>208.4 lbs  LVAD interrogation reveals:  Speed: 5600 Flow: 4.3 Power: 4.3 w PI: 3.2 Alarms: none Events: 40 PI events Hct: 32  Fixed speed:  5600 Low speed limit:  5300  Drive Line: Existing VAD dressing removed and site care performed using sterile technique. Wound bed cleansed with VASHE solution and sterile 4x4 gauze through light manual debridement. Skin surrounding wound bed cleaned with Chlora prep applicators x 2, allowed to dry. VASHE moistened 4 x 4 placed in wound bed, covered with several dry 4 x 4s. Wound tunnels approx 2 cm.  Drive line unincorporated. Moderate amount of thick yellow/green drainage noted. Improved drainage from yesterday. No redness, tenderness, foul odor or rash noted. There is a skin tear from the tegaderm.  Drive line anchor secured.   Continue daily wet to dry dressing changes using VASHE solution per bedside RN or VAD coordinator. Next dressing change due 07/31/23 by bedside nurse.   Labs:  LDH trend: 121>133>130>138>172>175>165>158>169>155>170>167>173>161>163>156>165>168>158  INR trend: 1.0>1.1>1.2>1.4>1.3>1.4>1.5>1.4>1.8>1.9>1.7>1.8>1.9>2.1>2.2>1.2>2.1  Anticoagulation Plan: -INR Goal: 2.0 - 2.5 -ASA Dose: none  ICD: N/A  Infection:  07/07/23>> blood cultures>> no growth 5 days; final  07/07/23>> wound cx>> few pseudomonas aeruginosa and MRSA; final  07/09/23>> OR driveline cx>> Few pseudomonas aeruginosa and METHICILLIN RESISTANT STAPHYLOCOCCUS AUREUS 07/09/23>> blood cultures>>no growth x 5 days; final  Drips:    Plan/Recommendations:  Page VAD coordinator with equipment issues or driveline problems Daily drive line wet to dry dressing changes with VASHE solution.   Simmie Davies RN,BSN VAD Coordinator  Office: [phone]  24/7 Pager: [phone]

## 2023-07-31 NOTE — TOC Initial Note (Addendum)
Transition of Care (TOC) - Initial/Assessment Note    Patient Details  Name: Kevin Underwood. MRN: [id number] Date of Birth: [birth year]  Transition of Care [facility]) CM/SW Contact:    [name], RN Phone Number: [phone] 07/31/2023, 7:18 AM  Clinical Narrative:   HF TOC CM/CSW spoke to wife in length. States she needs assistance with patient and was hoping pt would agree to have Home Hospice. States she used Home Hospice in the past and they offer more assistance in the home. She states HH agency that was arranged in the past never came out. Offered choice for [facility], she is agreeable to agency that will accept referral. She is requesting a hospital bed. Pt states he wants to try to manage for a year before he decides on any palliative care. Contacted [employer] rep, Morrie Sheldon with new referral. Left message. Will need HH RN and PT with [facility].    Will work on [employer] application for home.   8/23 [employer] declined d/t staffing.              Expected Discharge Plan: Home w Home Health Services Barriers to Discharge: Continued Medical Work up   Patient Goals and CMS Choice Patient states their goals for this hospitalization and ongoing recovery are:: Get back home CMS Medicare.gov Compare Post Acute Care list provided to:: Patient Represenative (must comment) Choice offered to / list presented to : Spouse [place] ownership interest in [facility].provided to:: Spouse    Expected Discharge Plan and Services   Discharge Planning Services: CM Consult Post Acute Care Choice: Home Health Living arrangements for the past 2 months: Single Family Home                           HH Arranged: RN, PT          Prior Living Arrangements/Services Living arrangements for the past 2 months: Single Family Home Lives with:: Spouse, Adult Children Patient language and need for interpreter reviewed:: Yes Do you feel safe going back to the place where you live?: Yes       Need for Family Participation in Patient Care: No (Comment) Care giver support system in place?: No (comment) Current home services: DME (LVAD, scale, nebulizer) Criminal Activity/Legal Involvement Pertinent to Current Situation/Hospitalization: No - Comment as needed  Activities of Daily Living Home Assistive Devices/Equipment: None ADL Screening (condition at time of admission) Patient's cognitive ability adequate to safely complete daily activities?: Yes Is the patient deaf or have difficulty hearing?: No Does the patient have difficulty seeing, even when wearing glasses/contacts?: No Does the patient have difficulty concentrating, remembering, or making decisions?: No Patient able to express need for assistance with ADLs?: Yes Does the patient have difficulty dressing or bathing?: No Independently performs ADLs?: Yes (appropriate for developmental age) Does the patient have difficulty walking or climbing stairs?: No Weakness of Legs: None Weakness of Arms/Hands: None  Permission Sought/Granted Permission sought to share information with : Case Manager, Family Supports, PCP Permission granted to share information with : Yes, Verbal Permission Granted  Share Information with NAME: Darrly Eckenroth     Permission granted to share info w Relationship: wife  Permission granted to share info w Contact Information: [phone]  Emotional Assessment Appearance:: Appears stated age Attitude/Demeanor/Rapport: Engaged Affect (typically observed): Appropriate Orientation: : Oriented to Self, Oriented to Place, Oriented to  Time, Oriented to Situation Alcohol / Substance Use: Never Used Psych  Involvement: No (comment)  Admission diagnosis:  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 (LVAD) (HCC) [Z61.7XXA] Patient Active Problem List   Diagnosis Date Noted   Stimulant use disorder 02/09/2023   Depression 02/09/2023   Pain and swelling of right wrist  09/30/2022   Hypotension 08/07/2022   MRSA infection    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 (LVAD) (HCC)    Left ventricular assist device (LVAD) complication, subsequent encounter 05/13/2022   AKI (acute kidney injury) (HCC) 05/13/2022   Hyperthyroidism 01/22/2022   Chronic systolic heart failure (HCC)    COPD (chronic obstructive pulmonary disease) (HCC) 09/09/2021   Malnutrition of moderate degree 11/17/2020   CHF (congestive heart failure), NYHA class IV, chronic, systolic (HCC) 11/17/2020   Palliative care by specialist    Goals of care, counseling/discussion    Hyperlipidemia    CHF exacerbation (HCC) 09/23/2020   Unstable angina (HCC) 09/22/2020   LVAD (left ventricular assist device) present (HCC)    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   Cardiogenic shock (HCC)    Acute respiratory failure (HCC)    RUQ abdominal pain    Cardiomyopathy (HCC) 11/21/2019   Acute systolic CHF (congestive heart failure) (HCC) 11/21/2019   History of alcohol abuse Quit 6 mos ago 11/21/2019   History of recreational drug use 11/21/2019   PAF (paroxysmal atrial fibrillation) (HCC) 11/21/2019   Cardiac volume overload 11/21/2019   Elevated brain natriuretic peptide (BNP) level 11/21/2019   Leg edema, right 11/21/2019   Hyponatremia 11/21/2019   Abnormal electrocardiogram (ECG) (EKG) 11/21/2019   Hyperglycemia 11/21/2019   Tremulousness 11/21/2019   Restlessness and agitation 11/21/2019   OSA (obstructive sleep apnea) 11/21/2019   Atrial fibrillation with RVR (HCC) 11/20/2019   Acute congestive heart failure (HCC) 11/20/2019   Acute respiratory distress 11/20/2019   COPD with acute exacerbation (HCC)    Hypertension    Type 2 diabetes mellitus (HCC)    Closed fracture of 5th metacarpal 08/31/2012   PCP:  [name], FNP Pharmacy:   [facility] [phone] - [place], [place] - [address] [facility] [address] [place] [address] Phone:  [phone] Fax: [phone]     Social Determinants of Health (SDOH) Social History: SDOH Screenings   Food Insecurity: No Food Insecurity (07/08/2023)  Housing: Low Risk  (07/08/2023)  Transportation Needs: No Transportation Needs (07/08/2023)  Utilities: Not At Risk (07/08/2023)  Alcohol Screen: Low Risk  (11/10/2020)  Depression (PHQ2-9): Low Risk  (10/16/2022)  Financial Resource Strain: Medium Risk (05/17/2022)  Physical Activity: Inactive (11/10/2020)  Tobacco Use: Medium Risk (07/09/2023)   SDOH Interventions:     Readmission Risk Interventions    07/01/2022    9:05 AM 11/28/2020   11:57 AM  Readmission Risk Prevention Plan  Transportation Screening Complete Complete  Medication Review (RN Care Manager) Complete Complete  PCP or Specialist appointment within 3-5 days of discharge Complete   HRI or Home Care Consult Complete Complete  SW Recovery Care/Counseling Consult Complete Complete  Palliative Care Screening Not Applicable Not Applicable  Skilled Nursing Facility Not Applicable Not Applicable

## 2023-07-31 NOTE — Progress Notes (Signed)
[facility]  Date of Admission:  07/07/2023     Total days of antibiotics 24         ASSESSMENT:  Kevin Underwood continues to have problems with abdominal pain and management as well as depression with current plan for discharge tomorrow despite agreeing yesterday and today willingness for continued IV treatment for 2 weeks which is contradictory to notes. The ideal treatment recommendation remains 4 weeks and today is 14 days since his last debridement/source control. Kevin Underwood to discuss plan of care with Kevin Underwood regarding disposition and plan of care for change to oral antibiotics. Continue post-operative wound care and LVAD management per Primary Team.   PLAN:  Continue daptomycin, cefepime and minocyline pending further discussion regarding disposition.  Post-operative wound care and LVAD management per Primary Team.   I have personally spent 20 minutes involved in face-to-face and non-face-to-face activities for this patient on the day of the visit. Professional time spent includes the following activities: Preparing to see the patient (review of tests), Obtaining and/or reviewing separately obtained history (admission/discharge record), Performing a medically appropriate examination and/or evaluation , Ordering medications/tests/procedures, referring and communicating with other health care professionals, Documenting clinical information in the EMR, Independently interpreting results (not separately reported), Communicating results to the patient/family/caregiver, Counseling and educating the patient/family/caregiver and Care coordination (not separately reported).   Principal Problem:   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 (LVAD) (HCC)    allopurinol  100 mg Oral Daily   amLODipine  10 mg Oral Daily   clonazepam  0.5 mg Oral BID   fluticasone furoate-vilanterol  1 puff Inhalation Daily   And   umeclidinium bromide   1 puff Inhalation Daily   gabapentin  300 mg Oral BID   insulin aspart  0-20 Units Subcutaneous TID WC   insulin aspart  0-5 Units Subcutaneous QHS   insulin glargine-yfgn  14 Units Subcutaneous Daily   losartan  50 mg Oral Daily   metoprolol succinate  25 mg Oral Daily   minocycline  200 mg Oral BID   mirtazapine  15 mg Oral QHS   pantoprazole  40 mg Oral Daily   sertraline  50 mg Oral Daily   spironolactone  25 mg Oral Daily   tamsulosin  0.4 mg Oral Daily   thiamine  100 mg Oral Daily   traZODone  150 mg Oral QHS   warfarin  6 mg Oral q1600   Warfarin - Pharmacist Dosing Inpatient   Does not apply q1600   zinc sulfate  220 mg Oral Daily    SUBJECTIVE:  Afebrile overnight. Events of yesterday noted.   Allergies  Allergen Reactions   Vancomycin Nausea And Vomiting and Other (See Comments)    Severe joint pain     Review of Systems: Review of Systems  Constitutional:  Negative for chills, fever and weight loss.  Respiratory:  Negative for cough, shortness of breath and wheezing.   Cardiovascular:  Negative for chest pain and leg swelling.  Gastrointestinal:  Negative for abdominal pain, constipation, diarrhea, nausea and vomiting.  Skin:  Negative for rash.      OBJECTIVE: Vitals:   07/31/23 0612 07/31/23 0709 07/31/23 0729 07/31/23 1136  BP: (!) 116/98 119/80  (!) 87/67  Pulse: 83  89   Resp:  16 19   Temp: 98 F (36.7 C) 97.6 F (36.4 C)  97.9 F (36.6 C)  TempSrc: Oral Oral  Oral  SpO2: 96%  98%   Weight: 94.5 kg     Height:       Body mass index is 29.89 kg/m.  Physical Exam Constitutional:      General: He is not in acute distress.    Appearance: He is well-developed.  Cardiovascular:     Rate and Rhythm: Normal rate and regular rhythm.     Heart sounds: Normal heart sounds.  Pulmonary:     Effort: Pulmonary effort is normal.     Breath sounds: Normal breath sounds.  Skin:    General: Skin is warm and dry.  Neurological:     Mental Status:  He is alert.     Lab Results Lab Results  Component Value Date   WBC 6.7 07/31/2023   HGB 11.7 (L) 07/31/2023   HCT 36.3 (L) 07/31/2023   MCV 87.1 07/31/2023   PLT 212 07/31/2023    Lab Results  Component Value Date   CREATININE 0.96 07/31/2023   BUN 24 (H) 07/31/2023   NA 137 07/31/2023   K 3.8 07/31/2023   CL 101 07/31/2023   CO2 25 07/31/2023    Lab Results  Component Value Date   ALT 24 07/07/2023   AST 17 07/07/2023   ALKPHOS 93 07/07/2023   BILITOT 0.3 07/07/2023     Microbiology: No results found for this or any previous visit (from the past 240 hour(s)).   Kevin Underwood, Kevin Underwood [facility]  07/31/2023  12:41 PM

## 2023-07-31 NOTE — Progress Notes (Signed)
ANTICOAGULATION CONSULT NOTE  Pharmacy Consult for warfarin  Indication:  LVAD  Allergies  Allergen Reactions   Vancomycin Nausea And Vomiting and Other (See Comments)    Severe joint pain    Patient Measurements: Height: 5\' 10"  (177.8 cm) Weight: 94.5 kg (208 lb 5.4 oz) IBW/kg (Calculated) : 73 Heparin Dosing Weight: 91.4 kg   Vital Signs: Temp: 97.6 F (36.4 C) (08/22 0709) Temp Source: Oral (08/22 0709) BP: 119/80 (08/22 0709) Pulse Rate: 89 (08/22 0729)  Labs: Recent Labs    07/29/23 0557 07/30/23 0702 07/31/23 0543  HGB 10.4* 11.1* 11.7*  HCT 31.8* 34.3* 36.3*  PLT 155 173 212  LABPROT 23.7* 24.4* 23.8*  INR 2.1* 2.2* 2.1*  CREATININE 1.07 0.92 0.96  CKTOTAL 49  --   --     Estimated Creatinine Clearance: 92.1 mL/min (by C-G formula based on SCr of 0.96 mg/dL).   Medical History: Past Medical History:  Diagnosis Date   "    Arthritis    CAD (coronary artery disease)    a. s/p CABG in 11/2019 with LIMA-LAD, SVG-OM1, SVG-PDA and SVG-D1   CHF (congestive heart failure) (HCC)    a. EF < 20% by echo in 11/2019   Essential hypertension    PAF (paroxysmal atrial fibrillation) (HCC)    Type 2 diabetes mellitus (HCC)     Medications:  Scheduled:   allopurinol  100 mg Oral Daily   amLODipine  10 mg Oral Daily   clonazepam  0.5 mg Oral BID   fluticasone furoate-vilanterol  1 puff Inhalation Daily   And   umeclidinium bromide  1 puff Inhalation Daily   gabapentin  300 mg Oral BID   insulin aspart  0-20 Units Subcutaneous TID WC   insulin aspart  0-5 Units Subcutaneous QHS   insulin glargine-yfgn  14 Units Subcutaneous Daily   losartan  50 mg Oral Daily   metoprolol succinate  25 mg Oral Daily   minocycline  200 mg Oral BID   mirtazapine  15 mg Oral QHS   pantoprazole  40 mg Oral Daily   sertraline  50 mg Oral Daily   sorbitol  30 mL Oral Once   spironolactone  25 mg Oral Daily   tamsulosin  0.4 mg Oral Daily   thiamine  100 mg Oral Daily    traZODone  150 mg Oral QHS   warfarin  6 mg Oral q1600   Warfarin - Pharmacist Dosing Inpatient   Does not apply q1600   zinc sulfate  220 mg Oral Daily    Assessment: 62 yom who presented to clinic with concern for driveline fracture and infection. Last [facility] appointment was on 04/21/2023 - INR was subtherapeutic at 1.4 and regimen at that time was 6 mg daily except 4 mg Tues/Thurs (LD 7/25).  Given low INR on admit, started fixed rate heparin 500 units/hr - now stopped.   INR is at goal today at 2.1, CBC and LDH stable.  Goal of Therapy:  INR 2-2.5 Monitor platelets by anticoagulation protocol: Yes   Plan:  Continue warfarin 6mg  daily. Monitor daily INR, CBC, LDH, and for s/sx of bleeding  [name], PharmD, BCPS, [facility] Clinical Pharmacist [phone] Please check AMION for all [facility] Pharmacy numbers 07/31/2023

## 2023-07-31 NOTE — TOC Progression Note (Signed)
Transition of Care (TOC) - Progression Note    Patient Details  Name: Kevin Underwood. MRN: [id number] Date of Birth: 09-13-1961  Transition of Care [facility]) CM/SW Contact  Nicanor Bake Phone Number: [phone] 07/31/2023, 10:49 AM  Clinical Narrative:   CSW met with the pt at bedside. CSW asked the pt if he is considering hospice. Pt stated no, he does not want hospice. Pt stated that he is open to see if he can get accepted to [facility]. CSW called the pts wife and asked the family to review their options and to let me know which ones they are interested in. Pts wife stated that she would like for him to decide. CSW encouraged the pt to review the [facility] list given and make a choice soon. CSW will follow up with pt. TOC will continue following.     Expected Discharge Plan: Home w Home Health Services Barriers to Discharge: Continued Medical Work up  Expected Discharge Plan and Services   Discharge Planning Services: CM Consult Post Acute Care Choice: Home Health Living arrangements for the past 2 months: Single Family Home                           HH Arranged: RN, PT           Social Determinants of Health (SDOH) Interventions SDOH Screenings   Food Insecurity: No Food Insecurity (07/08/2023)  Housing: Low Risk  (07/08/2023)  Transportation Needs: No Transportation Needs (07/08/2023)  Utilities: Not At Risk (07/08/2023)  Alcohol Screen: Low Risk  (11/10/2020)  Depression (PHQ2-9): Low Risk  (10/16/2022)  Financial Resource Strain: Medium Risk (05/17/2022)  Physical Activity: Inactive (11/10/2020)  Tobacco Use: Medium Risk (07/09/2023)    Readmission Risk Interventions    07/01/2022    9:05 AM 11/28/2020   11:57 AM  Readmission Risk Prevention Plan  Transportation Screening Complete Complete  Medication Review Oceanographer) Complete Complete  PCP or Specialist appointment within 3-5 days of discharge Complete   HRI or Home Care Consult Complete Complete  SW  Recovery Care/Counseling Consult Complete Complete  Palliative Care Screening Not Applicable Not Applicable  Skilled Nursing Facility Not Applicable Not Applicable

## 2023-08-01 DIAGNOSIS — T827XXA Infection and inflammatory reaction due to other cardiac and vascular devices, implants and grafts, initial encounter: Secondary | ICD-10-CM | POA: Diagnosis not present

## 2023-08-01 LAB — CBC
HCT: 29.7 % — ABNORMAL LOW (ref 39.0–52.0)
Hemoglobin: 9.7 g/dL — ABNORMAL LOW (ref 13.0–17.0)
MCH: 28.3 pg (ref 26.0–34.0)
MCHC: 32.7 g/dL (ref 30.0–36.0)
MCV: 86.6 fL (ref 80.0–100.0)
Platelets: 176 10*3/uL (ref 150–400)
RBC: 3.43 MIL/uL — ABNORMAL LOW (ref 4.22–5.81)
RDW: 14.5 % (ref 11.5–15.5)
WBC: 6.8 10*3/uL (ref 4.0–10.5)
nRBC: 0 % (ref 0.0–0.2)

## 2023-08-01 LAB — GLUCOSE, CAPILLARY
Glucose-Capillary: 132 mg/dL — ABNORMAL HIGH (ref 70–99)
Glucose-Capillary: 155 mg/dL — ABNORMAL HIGH (ref 70–99)
Glucose-Capillary: 90 mg/dL (ref 70–99)
Glucose-Capillary: 172 mg/dL — ABNORMAL HIGH (ref 70–99)

## 2023-08-01 LAB — PROTIME-INR
INR: 2.6 — ABNORMAL HIGH (ref 0.8–1.2)
Prothrombin Time: 28.3 s — ABNORMAL HIGH (ref 11.4–15.2)

## 2023-08-01 LAB — BASIC METABOLIC PANEL
Anion gap: 10 (ref 5–15)
BUN: 28 mg/dL — ABNORMAL HIGH (ref 8–23)
CO2: 25 mmol/L (ref 22–32)
Calcium: 8.6 mg/dL — ABNORMAL LOW (ref 8.9–10.3)
Chloride: 99 mmol/L (ref 98–111)
Creatinine, Ser: 1.46 mg/dL — ABNORMAL HIGH (ref 0.61–1.24)
GFR, Estimated: 54 mL/min — ABNORMAL LOW (ref >60)
Glucose, Bld: 164 mg/dL — ABNORMAL HIGH (ref 70–99)
Potassium: 4.1 mmol/L (ref 3.5–5.1)
Sodium: 134 mmol/L — ABNORMAL LOW (ref 135–145)

## 2023-08-01 LAB — MAGNESIUM: Magnesium: 2.1 mg/dL (ref 1.7–2.4)

## 2023-08-01 LAB — LACTATE DEHYDROGENASE: LDH: 149 U/L (ref 98–192)

## 2023-08-01 MED ORDER — WARFARIN SODIUM 4 MG PO TABS
4.0000 mg | ORAL_TABLET | Freq: Once | ORAL | Status: AC
  Administered 2023-08-01: 4 mg via ORAL
  Filled 2023-08-01: qty 1

## 2023-08-01 NOTE — Progress Notes (Signed)
ID Brief note  Polymicrobial LVAD exit site infection -MRSA, PsA, acinetobacter baumanii -Continue induction IV antibiotics x 4 week EOT 9/5.Noted today by CTS today that pt is willing to stay to complete course.  -Engage ID toward end of treatment for discharge antibiotic  plan  ID will sign off. Thank you for allowing [place] to participate in care.

## 2023-08-01 NOTE — Progress Notes (Signed)
ANTICOAGULATION CONSULT NOTE  Pharmacy Consult for warfarin  Indication:  LVAD  Allergies  Allergen Reactions   Vancomycin Nausea And Vomiting and Other (See Comments)    Severe joint pain    Patient Measurements: Height: 5\' 10"  (177.8 cm) Weight: 96 kg (211 lb 10.3 oz) IBW/kg (Calculated) : 73 Heparin Dosing Weight: 91.4 kg   Vital Signs: Temp: 97.8 F (36.6 C) (08/23 0714) Temp Source: Oral (08/23 0714) BP: 121/78 (08/23 0806) Pulse Rate: 81 (08/23 0806)  Labs: Recent Labs    07/30/23 0702 07/31/23 0543 08/01/23 0613  HGB 11.1* 11.7* 9.7*  HCT 34.3* 36.3* 29.7*  PLT 173 212 176  LABPROT 24.4* 23.8* 28.3*  INR 2.2* 2.1* 2.6*  CREATININE 0.92 0.96 1.46*    Estimated Creatinine Clearance: 61 mL/min (A) (by C-G formula based on SCr of 1.46 mg/dL (H)).   Medical History: Past Medical History:  Diagnosis Date   "    Arthritis    CAD (coronary artery disease)    a. s/p CABG in 11/2019 with LIMA-LAD, SVG-OM1, SVG-PDA and SVG-D1   CHF (congestive heart failure) (HCC)    a. EF < 20% by echo in 11/2019   Essential hypertension    PAF (paroxysmal atrial fibrillation) (HCC)    Type 2 diabetes mellitus (HCC)     Medications:  Scheduled:   allopurinol  100 mg Oral Daily   amLODipine  10 mg Oral Daily   clonazepam  0.5 mg Oral BID   fluticasone furoate-vilanterol  1 puff Inhalation Daily   And   umeclidinium bromide  1 puff Inhalation Daily   gabapentin  300 mg Oral BID   insulin aspart  0-20 Units Subcutaneous TID WC   insulin aspart  0-5 Units Subcutaneous QHS   insulin glargine-yfgn  14 Units Subcutaneous Daily   metoprolol succinate  25 mg Oral Daily   minocycline  200 mg Oral BID   mirtazapine  15 mg Oral QHS   pantoprazole  40 mg Oral Daily   sertraline  50 mg Oral Daily   tamsulosin  0.4 mg Oral Daily   thiamine  100 mg Oral Daily   traZODone  150 mg Oral QHS   warfarin  4 mg Oral ONCE-1600   Warfarin - Pharmacist Dosing Inpatient   Does not apply  q1600   zinc sulfate  220 mg Oral Daily    Assessment: 62 yom who presented to clinic with concern for driveline fracture and infection. Last [facility] appointment was on 04/21/2023 - INR was subtherapeutic at 1.4 and regimen at that time was 6 mg daily except 4 mg Tues/Thurs (LD 7/25). Given low INR on admit, started fixed rate heparin 500 units/hr - now stopped.   INR today is up to 2.6, CBC down slightly, and LDH stable.  Goal of Therapy:  INR 2-2.5 Monitor platelets by anticoagulation protocol: Yes   Plan:  Continue warfarin 4mg  x1 tonight - reducing with INR bump Monitor daily INR, CBC, LDH, and for s/sx of bleeding    [name], PharmD, BCPS, [facility] Clinical Pharmacist [phone] Please check AMION for all [facility] Pharmacy numbers 08/01/2023

## 2023-08-01 NOTE — Progress Notes (Signed)
15 Days Post-Op Procedure(s) (LRB): DRIVELINE WOUND DEBRIDEMENT (N/A) Subjective: Patient feels better today and denies abdominal pain. 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  ID recommends that 2 more weeks of IV antibiotics will provide optimal treatment and the patient is willing to remain in the hospital to complete this course of treatment as home antibiotics through a PICC line would be contraindicated. The patient is also receiving daily wet-to-dry dressing changes with Vashe wound solution. The amount of drainage has slowly improved and patient remains afebrile with normal white count.  Objective: Vital signs in last 24 hours: Temp:  [97.6 F (36.4 C)-98.1 F (36.7 C)] 98.1 F (36.7 C) (08/23 1104) Pulse Rate:  [69-81] 81 (08/23 0806) Cardiac Rhythm: Normal sinus rhythm (08/23 0700) Resp:  [14-19] 19 (08/23 1104) BP: (92-138)/(70-83) 138/83 (08/23 1104) SpO2:  [92 %-97 %] 97 % (08/23 0806) Weight:  [96 kg] 96 kg (08/23 1610)  Hemodynamic parameters for last 24 hours:  Normal sinus rhythm  Intake/Output from previous day: 08/22 0701 - 08/23 0700 In: 802 [P.O.:120; IV Piggyback:682] Out: 0  Intake/Output this shift: No intake/output data recorded.       Exam    General- alert and comfortable.  Abdomen distended due to diastases rectus, nontender.  Surgical dressing changed today and is intact and dry.    Neck- no JVD, no cervical adenopathy palpable, no carotid bruit   Lungs- clear without rales, wheezes   Cor- regular rate and rhythm, normal VAD hum   Abdomen- soft, non-tender but with baseline distended abdominal wall.   Extremities - warm, non-tender, minimal edema   Neuro- oriented, appropriate, no focal weakness   Lab Results: Recent Labs    07/31/23 0543 08/01/23 0613  WBC 6.7 6.8  HGB 11.7* 9.7*  HCT 36.3* 29.7*  PLT 212 176   BMET:  Recent Labs    07/31/23 0543 08/01/23 0613  NA 137  134*  K 3.8 4.1  CL 101 99  CO2 25 25  GLUCOSE 158* 164*  BUN 24* 28*  CREATININE 0.96 1.46*  CALCIUM 9.2 8.6*    PT/INR:  Recent Labs    08/01/23 0613  LABPROT 28.3*  INR 2.6*   ABG    Component Value Date/Time   PHART 7.508 (H) 11/20/2020 0438   HCO3 24.8 09/09/2021 0313   TCO2 29 11/20/2020 0438   ACIDBASEDEF 6.0 (H) 11/24/2019 1718   O2SAT 63.4 09/09/2021 0313   CBG (last 3)  Recent Labs    07/31/23 2110 08/01/23 0637 08/01/23 1107  GLUCAP 150* 132* 155*    Assessment/Plan: S/P Procedure(s) (LRB): DRIVELINE WOUND DEBRIDEMENT (N/A) Continue daily wet-to-dry dressing changes with Vashe wound solution Continue IV daptomycin and Fortaz for the dual organism VAD tunnel wound infection. Coumadin dosing per Pharm.D.  LOS: 25 days    Lovett Sox 08/01/2023

## 2023-08-01 NOTE — Progress Notes (Addendum)
Patient ID: Kevin Rigg., male   DOB: 1961-05-18, 62 y.o.   MRN: [id number]   Advanced Heart Failure VAD Team Note  PCP-Cardiologist: Nona Dell, MD  [facility]: Dr. Shirlee Latch   Subjective:    7/29: admitted w/ suspected DL fracture and DL infection. P/w "driveline communication fault" error message on LVAD. No pump stoppages. LVAD coordinator replaced modular cable and controller with resolution of driveline communication fault signal. CT C/A/P c/w prior imaging w/ similar appearance of mild circumferential thickening of the fat plane surrounding the drive line in the upper abdomen extending from the pump to the skin which may be chronic or represent an infectious process. No drainable fluid collection. Wound + BCx collected. Started on empiric daptomycin/Unasyn per ID.  7/30: Wound Cx growing Pseudomonas Aeruginosa (pan-sensitive) and S. aureus. Cefepime added 7/31: OR for wound debridement + application of wound vac.   8/8: Returned to OR for I&D  Remains on Daptomycin, Cefepime and minocycline. Surgical wound with pseudomonas aeruginosa and MRSA. Bcx from 7/29 and 7/31 NGTD.   Plans to stay another 2 weeks to complete 4 weeks IV abx.   Pain continues to improve. Planning to go for a walk. Wearing abdominal binder.  Reports less drainage from wound  Scr up, 0.96>1.46  LVAD INTERROGATION:  HeartMate III LVAD:   Flow 4.5 liters/min, speed 5600 power 4, PI  2.8.  Objective:    Vital Signs:   Temp:  [97.6 F (36.4 C)-98 F (36.7 C)] 97.8 F (36.6 C) (08/23 0714) Pulse Rate:  [69-81] 81 (08/23 0806) Resp:  [14-18] 16 (08/23 0806) BP: (87-121)/(67-82) 121/78 (08/23 0806) SpO2:  [92 %-97 %] 97 % (08/23 0806) Weight:  [96 kg] 96 kg (08/23 0638) Last BM Date : 07/30/23 Mean arterial Pressure 80s Intake/Output:   Intake/Output Summary (Last 24 hours) at 08/01/2023 0840 Last data filed at 08/01/2023 0646 Gross per 24 hour  Intake 802 ml  Output 0 ml  Net 802 ml    Physical  Exam  Physical Exam: GENERAL: Well appearing. HEENT: normal  NECK: Supple, JVP not elevated.   CARDIAC:  Mechanical heart sounds with LVAD hum present.  LUNGS:  Clear to auscultation bilaterally.  ABDOMEN:  Soft, round, positive bowel sounds x4.  Large abdominal hernia.  LVAD exit site: Drainage noted on driveline dressing EXTREMITIES:  Warm and dry, no cyanosis, clubbing, rash or edema  NEUROLOGIC:  Alert and oriented x 4. Affect pleasant.      Telemetry   NSR 70s  Labs   Basic Metabolic Panel: Recent Labs  Lab 07/28/23 0139 07/29/23 0557 07/30/23 0702 07/31/23 0543 08/01/23 0613  NA 136 135 134* 137 134*  K 3.9 3.8 4.0 3.8 4.1  CL 101 98 100 101 99  CO2 27 26 22 25 25   GLUCOSE 192* 143* 151* 158* 164*  BUN 18 20 21  24* 28*  CREATININE 1.05 1.07 0.92 0.96 1.46*  CALCIUM 9.0 8.7* 9.0 9.2 8.6*  MG 1.7 1.7 1.7 1.9 2.1    Liver Function Tests: No results for input(s): "AST", "ALT", "ALKPHOS", "BILITOT", "PROT", "ALBUMIN" in the last 168 hours.  CBC: Recent Labs  Lab 07/28/23 0139 07/29/23 0557 07/30/23 0702 07/31/23 0543 08/01/23 0613  WBC 5.9 6.4 7.0 6.7 6.8  HGB 10.2* 10.4* 11.1* 11.7* 9.7*  HCT 31.8* 31.8* 34.3* 36.3* 29.7*  MCV 85.3 85.3 86.4 87.1 86.6  PLT 163 155 173 212 176    INR: Recent Labs  Lab 07/28/23 0139 07/29/23 0557 07/30/23 0702 07/31/23  0543 08/01/23 0613  INR 1.9* 2.1* 2.2* 2.1* 2.6*    Other results:    Imaging   DG CHEST PORT 1 VIEW  Result Date: 07/31/2023 CLINICAL DATA:  CHF EXAM: PORTABLE CHEST 1 VIEW COMPARISON:  07/30/2023. FINDINGS: Prominent cardiac silhouette. Median sternotomy wires. Left ventricular device in place. Right lung clear. Continued evidence of left upper lung alveolar process that may represent pneumonia. No pneumothorax or pleural effusion. IMPRESSION: Possible left-sided pneumonia similar to the prior study. Prominent cardiac silhouette. Electronically Signed   By: Layla Maw M.D.   On:  07/31/2023 09:51   DG Chest Port 1 View  Result Date: 07/30/2023 CLINICAL DATA:  Left ventricular assist device. EXAM: PORTABLE CHEST 1 VIEW COMPARISON:  July 16, 2023. FINDINGS: Stable cardiomediastinal silhouette. Left ventricular assist device is noted. Sternotomy wires are noted. New left upper lobe airspace opacity is noted concerning for possible pneumonia. Right lung is clear. Old left clavicular fracture is noted. IMPRESSION: New left upper lobe airspace opacity is noted concerning for possible pneumonia. Electronically Signed   By: Lupita Raider M.D.   On: 07/30/2023 11:33   DG Abd Portable 1V  Result Date: 07/30/2023 CLINICAL DATA:  Left ventricular assist device.  Abdominal pain. EXAM: PORTABLE ABDOMEN - 1 VIEW COMPARISON:  None Available. FINDINGS: The bowel gas pattern is normal. Postsurgical changes are seen in the lumbar spine. Left ventricular assist device is noted. IMPRESSION: No abnormal bowel dilatation. Electronically Signed   By: Lupita Raider M.D.   On: 07/30/2023 11:31     Medications:     Scheduled Medications:  allopurinol  100 mg Oral Daily   amLODipine  10 mg Oral Daily   clonazepam  0.5 mg Oral BID   fluticasone furoate-vilanterol  1 puff Inhalation Daily   And   umeclidinium bromide  1 puff Inhalation Daily   gabapentin  300 mg Oral BID   insulin aspart  0-20 Units Subcutaneous TID WC   insulin aspart  0-5 Units Subcutaneous QHS   insulin glargine-yfgn  14 Units Subcutaneous Daily   losartan  50 mg Oral Daily   metoprolol succinate  25 mg Oral Daily   minocycline  200 mg Oral BID   mirtazapine  15 mg Oral QHS   pantoprazole  40 mg Oral Daily   sertraline  50 mg Oral Daily   spironolactone  25 mg Oral Daily   tamsulosin  0.4 mg Oral Daily   thiamine  100 mg Oral Daily   traZODone  150 mg Oral QHS   warfarin  6 mg Oral q1600   Warfarin - Pharmacist Dosing Inpatient   Does not apply q1600   zinc sulfate  220 mg Oral Daily    Infusions:   ceFEPime (MAXIPIME) IV 2 g (08/01/23 0639)   DAPTOmycin (CUBICIN) 800 mg in sodium chloride 0.9 % IVPB 800 mg (07/31/23 1403)    PRN Medications: acetaminophen, fluticasone, oxyCODONE, traMADol   Patient Profile  62 y.o. 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 Now s/p Heartmate 3 LVAD. Now admitted w/ suspected DL fracture after multiple falls and recurrent DL infection.  Assessment/Plan:   1.  LVAD Driveline infection: MRSA driveline infection, admitted in 6/23 and again in 7/23-8/23 with multiple trips to the OR for debridement.  Cultures have grown acinetobacter and  MRSA. He was readmitted with driveline infection in 2/24, completed course of daptomycin for MRSA. Now readmitted w/ suspected DL fracture and DL infection. P/w "driveline communication fault" error message on LVAD.LVAD coordinator replaced modular cable and controller with resolution of driveline communication fault signal. CT C/A/P c/w prior imaging w/ similar appearance of mild circumferential thickening of the fat plane surrounding the drive line in the upper abdomen extending from the pump to the skin which may be chronic or represent an infectious process. No drainable fluid collection.  -Initial wound Cx + for Pseudomonas Aeruginosa (pan-sensitive) and S. aureus. Now on Dapto, Cefepime and minocycline. BCx NGTD. Taken to OR 7/31 for wound debridement + application of wound vac. Returned to OR 8/7 for wound wash out/debridement - Continue abx per ID. Appreciate their assistance. ID recommends 4 weeks of IV antibiotics. He is willing to stay in the hospital for another 2 weeks. - Pain control improved with oxycodone and tramadol. Refer to pain management once discharged.  2. Suspected DL fracture with multiple recent falls - Has  had multiple falls during the last 2 months (prior to admit), one direct trauma to mid sternum (fell on cinder block) - CT chest/abd/pelvis per above  - CXR w/ LVAD partially imaged. The associated lead noted to be intact w/ no fracture or kink  - LVAD coordinators changed modular cable and controller with resolution  - No alarms   3.  Chronic systolic CHF s/p LVAD: Echo 10/21 with EF 20-25%, mildly decreased RV function. LHC/RHC in 12/21 with patent grafts, low output. Suspect mixed ischemic/nonischemic cardiomyopathy (prior heavy ETOH and drugs as well as CAD). Admitted with cardiogenic shock in 12/21, had placement of Impella 5.5 initially, now s/p Heartmate 3 LVAD on 11/17/20.  Ramp echo 2/23 with speed decreased to 5500 rpm. Speed now back to 5600 rpm.  He is not volume overloaded on exam. LVAD parameters stable.  - Continue current Toprol XL 25 mg daily.  - Did not tolerate Entresto in the past d/t AKI and low MAPs.  - Hold spiro and losartan (already received dose this am). Scr up to 1.46 today - MAPs stable - INR 2.1 today. Warfarin dosing per PharmD   4. CKD stage 3: Prior CTA abdomen/pelvis with high-grade stenosis of proximal right renal artery.  Creatinine stable 0.96> 1.46. Hold spiro. Trend.  5.  CAD: S/p CABG 12/20.  LHC pre-VAD with patent grafts, no target for intervention. - Continue Crestor 20 mg daily.   6.  Atrial fibrillation: Paroxysmal.  S/p Maze and LA appendage clip with CABG in 12/20.  Now in NSR.  - Continue Toprol XL 25 mg daily.  - Off amiodarone with hyperthyroidism.  7. Type 2 diabetes: improved control, Hgb A1c down from 12.1>>6.3  - continue SSI   8. 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 He says last use of methamphetamine was > 2 months ago. - UDS this admit negative (however collected > 48 hrs post admit)  - Declines substance use resources  9. 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  Followed by endocrinology. He is  now off amiodarone. He had been on methimazole but has not been taking recently. TSH was high on admit and free T4 normal  - Will NOT restart methimazole   10. Anemia (chronic blood loss):  Hgb stable.   11. COPD: Prior smoker.  - Continue Trelegy.   12. PVCs/NSVT:  - Now off amiodarone.   13.  Depression: Has turned to substance abuse  to cope.   - Has been referred to OP psych in [place]   14.  Gout: Continue allopurinol, he has prn colchicine.   15.  HTN:  - Monitor closely, if BP remains an issue may need reassessment of renal artery stenosis and possible treatment. Significant calcifications in both renal arteries on CT abdomen this admit. - MAPs stable in 80s - Continue Amlodipine 10 mg.  - Hold spiro and losartan as above   16. Agitation: Klonopin increased, agitation improved  17. Incisional hernia: Large abdominal wall hernia in close proximity to driveline. Cannot tolerate wearing abdominal binder.  Has chronic discomfort from the hernia and the driveline.  It is not incarcerated.  - Continue oxycodone at low dose. Alternating with Tramadol. - No good option for hernia repair given driveline proximity.         I reviewed the LVAD parameters from today, and compared the results to the patient's prior recorded data.  No programming changes were made.  The LVAD is functioning within specified parameters.  The patient performs LVAD self-test daily.  LVAD interrogation was negative for any significant power changes, alarms or PI events/speed drops.  LVAD equipment check completed and is in good working order.  Back-up equipment present.   LVAD education done on emergency procedures and precautions and reviewed exit site care.  Length of Stay: [address], PA-C 08/01/2023, 8:40 AM  VAD Team --- VAD ISSUES ONLY--- Pager [phone] (7am - 7am)  Advanced Heart Failure Team  Pager [phone] (M-F; 7a - 5p)  Please contact [facility] for night-coverage after  hours (5p -7a ) and weekends on [email or web address]

## 2023-08-01 NOTE — Progress Notes (Signed)
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 Started on IV antibiotics.   Patient presented to VAD clinic 7/29 for driveline communication fault- stating that he has had "alarms every couple hrs from his VAD for a couple months." Pt has no showed and cancelled all appointments in the past 2 months. Pt also had green/blue drainage visible through his driveline bandage. (Dressing change and wound cx obtained). Pt states that he has experienced dizziness associated with a fall multiple times over the past couple months but no signs of bleeding. Controller and modular cable exchanged. No further communication fault alarms noted.   CT Chest/Abd/Pelvis 7/29 1. Similar appearance of mild circumferential thickening of the fat plane surrounding the drive line in the upper abdomen extending from the pump to the skin which may be chronic or represent an infectious process. No drainable fluid collection.  Pt had drive line debridement in OR with placement of instillation wound vac on 07/09/23 with Dr Donata Clay. This was change to wet/dry on Friday 8/2 due to ongoing alarms related to the pts hernia.   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 Currently on IV Cefepime Q 8 hrs and IV Daptomycin 800 mg daily per ID team. ID team is recommending 4 weeks IV antibiotics. Pt is refusing to go a facility for IV antibiotics.   Pt lying in bed upon my arrival this afternoon. ID recommending 2 more weeks of IV antibiotics. Per PVT we will reassess next week.  Vital signs: Temp: 97.8 HR: 71 Doppler Pressure: 88 Automatic BP: 91/77 (84) O2 Sat: 97% on RA Wt: 193.3>194.8>196.4>194.7>...>211.4>209.2>206.8>208.4>211.6 lbs  LVAD interrogation reveals:  Speed: 5600 Flow: 4.4 Power: 4.2 w PI: 3 Alarms: none Events: 15 PI events Hct: 29  Fixed speed:  5600 Low speed limit:  5300  Drive  Line: Existing VAD dressing removed and site care performed using sterile technique. Wound bed cleansed with VASHE solution and sterile 4x4 gauze through light manual debridement. Skin surrounding wound bed cleaned with Chlora prep applicators x 2, allowed to dry. VASHE moistened 4 x 4 placed in wound bed, covered with several dry 4 x 4s. Wound tunnels approx 2 cm.  Drive line unincorporated. Moderate amount of thick yellow/green drainage noted. No redness, tenderness, foul odor or rash noted.  Drive line anchor secured.   ontinue daily wet to dry dressing changes using VASHE solution per bedside RN or VAD coordinator. Next dressing change due 08/02/23 by bedside nurse.       Labs:  LDH trend: 121>133>130>138>172>175>165>158>169>155>170>167>173>161>163>156>165>168>158>149  INR trend: 1.0>1.1>1.2>1.4>1.3>1.4>1.5>1.4>1.8>1.9>1.7>1.8>1.9>2.1>2.2>1.2>2.1>2.6  Anticoagulation Plan: -INR Goal: 2.0 - 2.5 -ASA Dose: none  ICD: N/A  Infection:  07/07/23>> blood cultures>> no growth 5 days; final  07/07/23>> wound cx>> few pseudomonas aeruginosa and MRSA; final  07/09/23>> OR driveline cx>> Few pseudomonas aeruginosa and METHICILLIN RESISTANT STAPHYLOCOCCUS AUREUS 07/09/23>> blood cultures>>no growth x 5 days; final  Drips:    Plan/Recommendations:  Page VAD coordinator with equipment issues or driveline problems Daily drive line wet to dry dressing changes with VASHE solution.   Carlton Adam RN,BSN VAD Coordinator  Office: [phone]  24/7 Pager: [phone]

## 2023-08-02 DIAGNOSIS — T827XXA Infection and inflammatory reaction due to other cardiac and vascular devices, implants and grafts, initial encounter: Secondary | ICD-10-CM | POA: Diagnosis not present

## 2023-08-02 LAB — BASIC METABOLIC PANEL
Anion gap: 11 (ref 5–15)
BUN: 26 mg/dL — ABNORMAL HIGH (ref 8–23)
CO2: 23 mmol/L (ref 22–32)
Calcium: 8.7 mg/dL — ABNORMAL LOW (ref 8.9–10.3)
Chloride: 96 mmol/L — ABNORMAL LOW (ref 98–111)
Creatinine, Ser: 1.14 mg/dL (ref 0.61–1.24)
GFR, Estimated: >60 mL/min (ref >60)
Glucose, Bld: 232 mg/dL — ABNORMAL HIGH (ref 70–99)
Potassium: 4.3 mmol/L (ref 3.5–5.1)
Sodium: 130 mmol/L — ABNORMAL LOW (ref 135–145)

## 2023-08-02 LAB — CBC
HCT: 33.1 % — ABNORMAL LOW (ref 39.0–52.0)
Hemoglobin: 10.6 g/dL — ABNORMAL LOW (ref 13.0–17.0)
MCH: 27.4 pg (ref 26.0–34.0)
MCHC: 32 g/dL (ref 30.0–36.0)
MCV: 85.5 fL (ref 80.0–100.0)
Platelets: 229 10*3/uL (ref 150–400)
RBC: 3.87 MIL/uL — ABNORMAL LOW (ref 4.22–5.81)
RDW: 14.2 % (ref 11.5–15.5)
WBC: 7.2 10*3/uL (ref 4.0–10.5)
nRBC: 0 % (ref 0.0–0.2)

## 2023-08-02 LAB — PROTIME-INR
INR: 2.1 — ABNORMAL HIGH (ref 0.8–1.2)
Prothrombin Time: 24 s — ABNORMAL HIGH (ref 11.4–15.2)

## 2023-08-02 LAB — GLUCOSE, CAPILLARY
Glucose-Capillary: 144 mg/dL — ABNORMAL HIGH (ref 70–99)
Glucose-Capillary: 78 mg/dL (ref 70–99)
Glucose-Capillary: 107 mg/dL — ABNORMAL HIGH (ref 70–99)
Glucose-Capillary: 130 mg/dL — ABNORMAL HIGH (ref 70–99)

## 2023-08-02 LAB — LACTATE DEHYDROGENASE: LDH: 180 U/L (ref 98–192)

## 2023-08-02 LAB — MAGNESIUM: Magnesium: 1.9 mg/dL (ref 1.7–2.4)

## 2023-08-02 MED ORDER — LOSARTAN POTASSIUM 25 MG PO TABS
12.5000 mg | ORAL_TABLET | Freq: Every day | ORAL | Status: DC
  Administered 2023-08-02 – 2023-08-07 (×6): 12.5 mg via ORAL
  Filled 2023-08-02 (×6): qty 1

## 2023-08-02 MED ORDER — WARFARIN SODIUM 4 MG PO TABS: 4.0000 mg | ORAL_TABLET | ORAL | Status: DC

## 2023-08-02 MED ORDER — WARFARIN SODIUM 3 MG PO TABS
6.0000 mg | ORAL_TABLET | ORAL | Status: AC
  Administered 2023-08-02: 6 mg via ORAL
  Filled 2023-08-02: qty 2

## 2023-08-02 NOTE — Progress Notes (Signed)
ANTICOAGULATION CONSULT NOTE  Pharmacy Consult for warfarin  Indication:  LVAD  Allergies  Allergen Reactions   Vancomycin Nausea And Vomiting and Other (See Comments)    Severe joint pain    Patient Measurements: Height: 5\' 10"  (177.8 cm) Weight: 96 kg (211 lb 10.3 oz) IBW/kg (Calculated) : 73 Heparin Dosing Weight: 91.4 kg   Vital Signs: Temp: 98.3 F (36.8 C) (08/24 1130) Temp Source: Oral (08/24 1130) BP: 124/94 (08/24 1130) Pulse Rate: 88 (08/24 1130)  Labs: Recent Labs    07/31/23 0543 08/01/23 0613 08/02/23 0655  HGB 11.7* 9.7* 10.6*  HCT 36.3* 29.7* 33.1*  PLT 212 176 229  LABPROT 23.8* 28.3* 24.0*  INR 2.1* 2.6* 2.1*  CREATININE 0.96 1.46* 1.14    Estimated Creatinine Clearance: 78.1 mL/min (by C-G formula based on SCr of 1.14 mg/dL).   Medical History: Past Medical History:  Diagnosis Date   "    Arthritis    CAD (coronary artery disease)    a. s/p CABG in 11/2019 with LIMA-LAD, SVG-OM1, SVG-PDA and SVG-D1   CHF (congestive heart failure) (HCC)    a. EF < 20% by echo in 11/2019   Essential hypertension    PAF (paroxysmal atrial fibrillation) (HCC)    Type 2 diabetes mellitus (HCC)     Medications:  Scheduled:   allopurinol  100 mg Oral Daily   amLODipine  10 mg Oral Daily   clonazepam  0.5 mg Oral BID   fluticasone furoate-vilanterol  1 puff Inhalation Daily   And   umeclidinium bromide  1 puff Inhalation Daily   gabapentin  300 mg Oral BID   insulin aspart  0-20 Units Subcutaneous TID WC   insulin aspart  0-5 Units Subcutaneous QHS   insulin glargine-yfgn  14 Units Subcutaneous Daily   losartan  12.5 mg Oral Daily   metoprolol succinate  25 mg Oral Daily   minocycline  200 mg Oral BID   mirtazapine  15 mg Oral QHS   pantoprazole  40 mg Oral Daily   sertraline  50 mg Oral Daily   tamsulosin  0.4 mg Oral Daily   thiamine  100 mg Oral Daily   traZODone  150 mg Oral QHS   Warfarin - Pharmacist Dosing Inpatient   Does not apply q1600    zinc sulfate  220 mg Oral Daily    Assessment: 62 yom who presented to clinic with concern for driveline fracture and infection. Last [facility] appointment was on 04/21/2023 - INR was subtherapeutic at 1.4 and regimen at that time was 6 mg daily except 4 mg Tues/Thurs (LD 7/25). Given low INR on admit, started fixed rate heparin 500 units/hr - now stopped.   INR is at goal today at 2.1.  CBC and LDH stable.  Goal of Therapy:  INR 2-2.5 Monitor platelets by anticoagulation protocol: Yes   Plan:  Re-trial home dose of warfarin - 6 mg daily except 4 mg on Tues and Thurs. Monitor daily INR, CBC, LDH, and for s/sx of bleeding   Reece Leader, Colon Flattery, BCCP Clinical Pharmacist  08/02/2023 2:40 PM   [facility] pharmacy phone numbers are listed on [email or web address]

## 2023-08-02 NOTE — Progress Notes (Signed)
Patient ID: Kevin Rigg., male   DOB: 06-25-1961, 62 y.o.   MRN: [id number]   Advanced Heart Failure VAD Team Note  PCP-Cardiologist: Nona Dell, MD  [facility]: Dr. Shirlee Latch   Subjective:    7/29: admitted w/ suspected DL fracture and DL infection. P/w "driveline communication fault" error message on LVAD. No pump stoppages. LVAD coordinator replaced modular cable and controller with resolution of driveline communication fault signal. CT C/A/P c/w prior imaging w/ similar appearance of mild circumferential thickening of the fat plane surrounding the drive line in the upper abdomen extending from the pump to the skin which may be chronic or represent an infectious process. No drainable fluid collection. Wound + BCx collected. Started on empiric daptomycin/Unasyn per ID.  7/30: Wound Cx growing Pseudomonas Aeruginosa (pan-sensitive) and S. aureus. Cefepime added 7/31: OR for wound debridement + application of wound vac.   8/8: Returned to OR for I&D  Remains on Daptomycin, Cefepime and minocycline. Surgical wound with pseudomonas aeruginosa and MRSA. Bcx from 7/29 and 7/31 NGTD.   Plans to stay another 2 weeks to complete 4 weeks IV abx.   - No complaints today. Doing well. Pain well controlled.   LVAD INTERROGATION:  HeartMate III LVAD:   Flow 4.7 liters/min, speed 5650 power 4.1, PI  3.3.  Objective:    Vital Signs:   Temp:  [97.8 F (36.6 C)-98.6 F (37 C)] 98.3 F (36.8 C) (08/24 1130) Pulse Rate:  [69-88] 88 (08/24 1130) Resp:  [10-15] 12 (08/24 0736) BP: (91-124)/(70-94) 124/94 (08/24 1130) SpO2:  [95 %-97 %] 95 % (08/24 1130) Weight:  [96 kg] 96 kg (08/24 0543) Last BM Date : 07/30/23 Mean arterial Pressure 80s Intake/Output:   Intake/Output Summary (Last 24 hours) at 08/02/2023 1201 Last data filed at 08/02/2023 1135 Gross per 24 hour  Intake 240 ml  Output --  Net 240 ml    Physical Exam  Physical Exam: GENERAL: Well appearing. HEENT: normal  NECK: Supple,  JVP not elevated.   CARDIAC:  Mechanical heart sounds with LVAD hum present.  LUNGS:  Clear to auscultation bilaterally.  ABDOMEN:  Soft, round, positive bowel sounds x4.  Large abdominal hernia.  LVAD exit site: Drainage noted on driveline dressing EXTREMITIES:  Warm and dry, no cyanosis, clubbing, rash or edema  NEUROLOGIC:  Alert and oriented x 4. Affect pleasant.      Telemetry   NSR 70s  Labs   Basic Metabolic Panel: Recent Labs  Lab 07/29/23 0557 07/30/23 0702 07/31/23 0543 08/01/23 0613 08/02/23 0655  NA 135 134* 137 134* 130*  K 3.8 4.0 3.8 4.1 4.3  CL 98 100 101 99 96*  CO2 26 22 25 25 23   GLUCOSE 143* 151* 158* 164* 232*  BUN 20 21 24* 28* 26*  CREATININE 1.07 0.92 0.96 1.46* 1.14  CALCIUM 8.7* 9.0 9.2 8.6* 8.7*  MG 1.7 1.7 1.9 2.1 1.9    Liver Function Tests: No results for input(s): "AST", "ALT", "ALKPHOS", "BILITOT", "PROT", "ALBUMIN" in the last 168 hours.  CBC: Recent Labs  Lab 07/29/23 0557 07/30/23 0702 07/31/23 0543 08/01/23 0613 08/02/23 0655  WBC 6.4 7.0 6.7 6.8 7.2  HGB 10.4* 11.1* 11.7* 9.7* 10.6*  HCT 31.8* 34.3* 36.3* 29.7* 33.1*  MCV 85.3 86.4 87.1 86.6 85.5  PLT 155 173 212 176 229    INR: Recent Labs  Lab 07/29/23 0557 07/30/23 0702 07/31/23 0543 08/01/23 0613 08/02/23 0655  INR 2.1* 2.2* 2.1* 2.6* 2.1*    Other  results:    Imaging   No results found.   Medications:     Scheduled Medications:  allopurinol  100 mg Oral Daily   amLODipine  10 mg Oral Daily   clonazepam  0.5 mg Oral BID   fluticasone furoate-vilanterol  1 puff Inhalation Daily   And   umeclidinium bromide  1 puff Inhalation Daily   gabapentin  300 mg Oral BID   insulin aspart  0-20 Units Subcutaneous TID WC   insulin aspart  0-5 Units Subcutaneous QHS   insulin glargine-yfgn  14 Units Subcutaneous Daily   metoprolol succinate  25 mg Oral Daily   minocycline  200 mg Oral BID   mirtazapine  15 mg Oral QHS   pantoprazole  40 mg Oral Daily    sertraline  50 mg Oral Daily   tamsulosin  0.4 mg Oral Daily   thiamine  100 mg Oral Daily   traZODone  150 mg Oral QHS   Warfarin - Pharmacist Dosing Inpatient   Does not apply q1600   zinc sulfate  220 mg Oral Daily    Infusions:  ceFEPime (MAXIPIME) IV 2 g (08/02/23 0533)   DAPTOmycin (CUBICIN) 800 mg in sodium chloride 0.9 % IVPB 800 mg (08/01/23 1339)    PRN Medications: acetaminophen, fluticasone, oxyCODONE, traMADol   Patient Profile  62 y.o. 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 Now s/p Heartmate 3 LVAD. Now admitted w/ suspected DL fracture after multiple falls and recurrent DL infection.  Assessment/Plan:   1.  LVAD Driveline infection: MRSA driveline infection, admitted in 6/23 and again in 7/23-8/23 with multiple trips to the OR for debridement.  Cultures have grown acinetobacter and MRSA. He was readmitted with driveline infection in 2/24, completed course of daptomycin for MRSA. Now readmitted w/ suspected DL fracture and DL infection. P/w "driveline communication fault" error message on LVAD.LVAD coordinator replaced modular cable and controller with resolution of driveline communication fault signal. CT C/A/P c/w prior imaging w/ similar appearance of mild circumferential thickening of the fat plane surrounding the drive line in the upper abdomen extending from the pump to the skin which may be chronic or represent an infectious process. No drainable fluid collection.  -Initial wound Cx + for Pseudomonas Aeruginosa (pan-sensitive) and S. aureus. Now on Dapto, Cefepime and minocycline. BCx NGTD. Taken to OR 7/31 for wound debridement + application of wound vac. Returned to OR 8/7 for wound wash out/debridement - Continue abx per ID. Appreciate their assistance. ID recommends 4  weeks of IV antibiotics. He is willing to stay in the hospital for another 2 weeks. - Pain control improved with oxycodone and tramadol. Refer to pain management once discharged.  2. Suspected DL fracture with multiple recent falls - Has had multiple falls during the last 2 months (prior to admit), one direct trauma to mid sternum (fell on cinder block) - CT chest/abd/pelvis per above  - CXR w/ LVAD partially imaged. The associated lead noted to be intact w/ no fracture or kink  - LVAD coordinators changed modular cable and controller with resolution  - No alarms   3.  Chronic systolic CHF s/p LVAD: Echo 10/21 with EF 20-25%, mildly decreased RV function. LHC/RHC in 12/21 with patent grafts, low output. Suspect mixed ischemic/nonischemic cardiomyopathy (prior heavy ETOH and drugs as well as  CAD). Admitted with cardiogenic shock in 12/21, had placement of Impella 5.5 initially, now s/p Heartmate 3 LVAD on 11/17/20.  Ramp echo 2/23 with speed decreased to 5500 rpm. Speed now back to 5600 rpm.  He is not volume overloaded on exam. LVAD parameters stable.  - Continue current Toprol XL 25 mg daily.  - Did not tolerate Entresto in the past d/t AKI and low MAPs.  - sCr improved to 1.14 today; will restart losartan 12.5mg  daily.  - MAPs stable - INR 2.1 today. Warfarin dosing per PharmD. Discussed at bedside.   4. CKD stage 3: Prior CTA abdomen/pelvis with high-grade stenosis of proximal right renal artery.  Creatinine stable. Improved to baseline today.   5.  CAD: S/p CABG 12/20.  LHC pre-VAD with patent grafts, no target for intervention. - Continue Crestor 20 mg daily.   6.  Atrial fibrillation: Paroxysmal.  S/p Maze and LA appendage clip with CABG in 12/20.  Now in NSR.  - Continue Toprol XL 25 mg daily.  - Off amiodarone with hyperthyroidism.  7. Type 2 diabetes: improved control, Hgb A1c down from 12.1>>6.3  - continue SSI   8. 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  He says last use of methamphetamine was > 2 months ago. - UDS this admit negative (however collected > 48 hrs post admit)  - Declines substance use resources  9. 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  Followed by endocrinology. He is now off amiodarone. He had been on methimazole but has not been taking recently. TSH was high on admit and free T4 normal  - Will NOT restart methimazole   10. Anemia (chronic blood loss):  Hgb stable.   11. COPD: Prior smoker.  - Continue Trelegy.   12. PVCs/NSVT:  - Now off amiodarone.   13.  Depression: Has turned to substance abuse to cope.   - Has been referred to OP psych in [place]   14.  Gout: Continue allopurinol, he has prn colchicine.   15.  HTN:  - Monitor closely, if BP remains an issue may need reassessment of renal artery stenosis and possible treatment. Significant calcifications in both renal arteries on CT abdomen this admit. - MAPs stable in 80s - Continue Amlodipine 10 mg.  - Hold spiro and losartan as above   16. Agitation: Klonopin increased, agitation improved  17. Incisional hernia: Large abdominal wall hernia in close proximity to driveline. Cannot tolerate wearing abdominal binder.  Has chronic discomfort from the hernia and the driveline.  It is not incarcerated.  - Continue oxycodone at low dose. Alternating with Tramadol. - No good option for hernia repair given driveline proximity.         I reviewed the LVAD parameters from today, and compared the results to the patient's prior recorded data.  No programming changes were made.  The LVAD is functioning within specified parameters.  The patient performs LVAD self-test daily.  LVAD interrogation was negative for any significant power changes, alarms or PI events/speed drops.  LVAD equipment check completed and is in good working order.  Back-up equipment present.   LVAD education done on emergency procedures and precautions and reviewed exit site  care.  Length of Stay: [address], DO 08/02/2023, 12:01 PM  VAD Team --- VAD ISSUES ONLY--- Pager [phone] (7am - 7am)  Advanced Heart Failure Team  Pager [phone] (M-F; 7a - 5p)  Please contact [facility] for night-coverage after hours (5p -7a ) and weekends  on [email or web address]

## 2023-08-03 DIAGNOSIS — T827XXA Infection and inflammatory reaction due to other cardiac and vascular devices, implants and grafts, initial encounter: Secondary | ICD-10-CM | POA: Diagnosis not present

## 2023-08-03 LAB — BASIC METABOLIC PANEL
Anion gap: 11 (ref 5–15)
BUN: 24 mg/dL — ABNORMAL HIGH (ref 8–23)
CO2: 25 mmol/L (ref 22–32)
Calcium: 9.3 mg/dL (ref 8.9–10.3)
Chloride: 98 mmol/L (ref 98–111)
Creatinine, Ser: 1.04 mg/dL (ref 0.61–1.24)
GFR, Estimated: >60 mL/min (ref >60)
Glucose, Bld: 123 mg/dL — ABNORMAL HIGH (ref 70–99)
Potassium: 4.3 mmol/L (ref 3.5–5.1)
Sodium: 134 mmol/L — ABNORMAL LOW (ref 135–145)

## 2023-08-03 LAB — PROTIME-INR
INR: 2.1 — ABNORMAL HIGH (ref 0.8–1.2)
Prothrombin Time: 24 s — ABNORMAL HIGH (ref 11.4–15.2)

## 2023-08-03 LAB — CBC
HCT: 33.4 % — ABNORMAL LOW (ref 39.0–52.0)
Hemoglobin: 10.6 g/dL — ABNORMAL LOW (ref 13.0–17.0)
MCH: 26.9 pg (ref 26.0–34.0)
MCHC: 31.7 g/dL (ref 30.0–36.0)
MCV: 84.8 fL (ref 80.0–100.0)
Platelets: 246 10*3/uL (ref 150–400)
RBC: 3.94 MIL/uL — ABNORMAL LOW (ref 4.22–5.81)
RDW: 14.1 % (ref 11.5–15.5)
WBC: 7.3 10*3/uL (ref 4.0–10.5)
nRBC: 0 % (ref 0.0–0.2)

## 2023-08-03 LAB — GLUCOSE, CAPILLARY
Glucose-Capillary: 117 mg/dL — ABNORMAL HIGH (ref 70–99)
Glucose-Capillary: 184 mg/dL — ABNORMAL HIGH (ref 70–99)
Glucose-Capillary: 132 mg/dL — ABNORMAL HIGH (ref 70–99)
Glucose-Capillary: 140 mg/dL — ABNORMAL HIGH (ref 70–99)

## 2023-08-03 LAB — MAGNESIUM: Magnesium: 1.9 mg/dL (ref 1.7–2.4)

## 2023-08-03 LAB — LACTATE DEHYDROGENASE: LDH: 174 U/L (ref 98–192)

## 2023-08-03 MED ORDER — OXYCODONE HCL 5 MG PO TABS
10.0000 mg | ORAL_TABLET | ORAL | Status: DC | PRN
  Administered 2023-08-03 – 2023-08-15 (×54): 10 mg via ORAL
  Filled 2023-08-03 (×55): qty 2

## 2023-08-03 MED ORDER — WARFARIN SODIUM 4 MG PO TABS
4.0000 mg | ORAL_TABLET | ORAL | Status: DC
  Administered 2023-08-05 – 2023-08-07 (×2): 4 mg via ORAL
  Filled 2023-08-03 (×2): qty 1

## 2023-08-03 MED ORDER — WARFARIN SODIUM 3 MG PO TABS
6.0000 mg | ORAL_TABLET | ORAL | Status: DC
  Administered 2023-08-03 – 2023-08-11 (×7): 6 mg via ORAL
  Filled 2023-08-03 (×7): qty 2

## 2023-08-03 NOTE — Progress Notes (Signed)
Patient ID: Kevin Rigg., male   DOB: 1961/02/06, 62 y.o.   MRN: [id number]   Advanced Heart Failure VAD Team Note  PCP-Cardiologist: Nona Dell, MD  [facility]: Dr. Shirlee Latch   Subjective:    7/29: admitted w/ suspected DL fracture and DL infection. P/w "driveline communication fault" error message on LVAD. No pump stoppages. LVAD coordinator replaced modular cable and controller with resolution of driveline communication fault signal. CT C/A/P c/w prior imaging w/ similar appearance of mild circumferential thickening of the fat plane surrounding the drive line in the upper abdomen extending from the pump to the skin which may be chronic or represent an infectious process. No drainable fluid collection. Wound + BCx collected. Started on empiric daptomycin/Unasyn per ID.  7/30: Wound Cx growing Pseudomonas Aeruginosa (pan-sensitive) and S. aureus. Cefepime added 7/31: OR for wound debridement + application of wound vac.   8/8: Returned to OR for I&D  Remains on Daptomycin, Cefepime and minocycline. Surgical wound with pseudomonas aeruginosa and MRSA. Bcx from 7/29 and 7/31 NGTD.   Plans to stay another 2 weeks to complete 4 weeks IV abx.   - No acute events overnight.   LVAD INTERROGATION:  HeartMate III LVAD:   Flow 4.6 liters/min, speed 5600 power 4.1, PI  3.4.  Objective:    Vital Signs:   Temp:  [97.7 F (36.5 C)-98.7 F (37.1 C)] 98 F (36.7 C) (08/25 0740) Pulse Rate:  [80-85] 80 (08/25 0740) Resp:  [17-18] 18 (08/25 0740) BP: (105-128)/(77-96) 114/77 (08/25 0740) SpO2:  [94 %-95 %] 94 % (08/25 0818) Weight:  [94.1 kg] 94.1 kg (08/25 0552) Last BM Date : 07/30/23 Mean arterial Pressure 80s Intake/Output:   Intake/Output Summary (Last 24 hours) at 08/03/2023 1130 Last data filed at 08/03/2023 0557 Gross per 24 hour  Intake 1200 ml  Output --  Net 1200 ml    Physical Exam  Physical Exam: GENERAL: Well appearing. HEENT: normal  NECK: Supple, JVP not elevated.  Euvolemic on exam.    CARDIAC:  Mechanical heart sounds with LVAD hum present.  LUNGS:  Clear to auscultation bilaterally.  ABDOMEN:  Soft, round, positive bowel sounds x4.  Large abdominal hernia.  LVAD exit site: Drainage noted on driveline dressing EXTREMITIES:  Warm and dry, no cyanosis, clubbing, rash or edema  NEUROLOGIC:  Alert and oriented x 4. Affect pleasant.      Telemetry   NSR 70s  Labs   Basic Metabolic Panel: Recent Labs  Lab 07/30/23 0702 07/31/23 0543 08/01/23 0613 08/02/23 0655 08/03/23 0650  NA 134* 137 134* 130* 134*  K 4.0 3.8 4.1 4.3 4.3  CL 100 101 99 96* 98  CO2 22 25 25 23 25   GLUCOSE 151* 158* 164* 232* 123*  BUN 21 24* 28* 26* 24*  CREATININE 0.92 0.96 1.46* 1.14 1.04  CALCIUM 9.0 9.2 8.6* 8.7* 9.3  MG 1.7 1.9 2.1 1.9 1.9    Liver Function Tests: No results for input(s): "AST", "ALT", "ALKPHOS", "BILITOT", "PROT", "ALBUMIN" in the last 168 hours.  CBC: Recent Labs  Lab 07/30/23 0702 07/31/23 0543 08/01/23 0613 08/02/23 0655 08/03/23 0650  WBC 7.0 6.7 6.8 7.2 7.3  HGB 11.1* 11.7* 9.7* 10.6* 10.6*  HCT 34.3* 36.3* 29.7* 33.1* 33.4*  MCV 86.4 87.1 86.6 85.5 84.8  PLT 173 212 176 229 246    INR: Recent Labs  Lab 07/30/23 0702 07/31/23 0543 08/01/23 0613 08/02/23 0655 08/03/23 0650  INR 2.2* 2.1* 2.6* 2.1* 2.1*    Other  results:    Imaging   No results found.   Medications:     Scheduled Medications:  allopurinol  100 mg Oral Daily   amLODipine  10 mg Oral Daily   clonazepam  0.5 mg Oral BID   fluticasone furoate-vilanterol  1 puff Inhalation Daily   And   umeclidinium bromide  1 puff Inhalation Daily   gabapentin  300 mg Oral BID   insulin aspart  0-20 Units Subcutaneous TID WC   insulin aspart  0-5 Units Subcutaneous QHS   insulin glargine-yfgn  14 Units Subcutaneous Daily   losartan  12.5 mg Oral Daily   metoprolol succinate  25 mg Oral Daily   minocycline  200 mg Oral BID   mirtazapine  15 mg Oral QHS    pantoprazole  40 mg Oral Daily   sertraline  50 mg Oral Daily   tamsulosin  0.4 mg Oral Daily   thiamine  100 mg Oral Daily   traZODone  150 mg Oral QHS   [START ON 08/05/2023] warfarin  4 mg Oral Once per day on Tuesday Thursday   Warfarin - Pharmacist Dosing Inpatient   Does not apply q1600   zinc sulfate  220 mg Oral Daily    Infusions:  ceFEPime (MAXIPIME) IV 2 g (08/03/23 0600)   DAPTOmycin (CUBICIN) 800 mg in sodium chloride 0.9 % IVPB 800 mg (08/02/23 1545)    PRN Medications: acetaminophen, fluticasone, oxyCODONE, traMADol   Patient Profile  62 y.o. 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 Now s/p Heartmate 3 LVAD. Now admitted w/ suspected DL fracture after multiple falls and recurrent DL infection.  Assessment/Plan:   1.  LVAD Driveline infection: MRSA driveline infection, admitted in 6/23 and again in 7/23-8/23 with multiple trips to the OR for debridement.  Cultures have grown acinetobacter and MRSA. He was readmitted with driveline infection in 2/24, completed course of daptomycin for MRSA. Now readmitted w/ suspected DL fracture and DL infection. P/w "driveline communication fault" error message on LVAD.LVAD coordinator replaced modular cable and controller with resolution of driveline communication fault signal. CT C/A/P c/w prior imaging w/ similar appearance of mild circumferential thickening of the fat plane surrounding the drive line in the upper abdomen extending from the pump to the skin which may be chronic or represent an infectious process. No drainable fluid collection.  -Initial wound Cx + for Pseudomonas Aeruginosa (pan-sensitive) and S. aureus. Now on Dapto, Cefepime and minocycline. BCx NGTD. Taken to OR 7/31 for wound debridement + application of wound vac.  Returned to OR 8/7 for wound wash out/debridement - Continue abx per ID. Appreciate their assistance. ID recommends 4 weeks of IV antibiotics. He is willing to stay in the hospital for another 2 weeks. - Pain well controlled improved with oxycodone and tramadol. Refer to pain management once discharged.  2. Suspected DL fracture with multiple recent falls - Has had multiple falls during the last 2 months (prior to admit), one direct trauma to mid sternum (fell on cinder block) - CT chest/abd/pelvis per above  - CXR w/ LVAD partially imaged. The associated lead noted to be intact w/ no fracture or kink  - LVAD coordinators changed modular cable and controller with resolution  - No alarms   3.  Chronic systolic CHF s/p LVAD: Echo 10/21 with EF  20-25%, mildly decreased RV function. LHC/RHC in 12/21 with patent grafts, low output. Suspect mixed ischemic/nonischemic cardiomyopathy (prior heavy ETOH and drugs as well as CAD). Admitted with cardiogenic shock in 12/21, had placement of Impella 5.5 initially, now s/p Heartmate 3 LVAD on 11/17/20.  Ramp echo 2/23 with speed decreased to 5500 rpm. Speed now back to 5600 rpm.  He is not volume overloaded on exam. LVAD parameters stable.  - Continue current Toprol XL 25 mg daily.  - Did not tolerate Entresto in the past d/t AKI and low MAPs.  - sCr 1.04; continue low dose losartan - MAPs stable - INR 2.1 today. Warfarin dosing per PharmD. Discussed at bedside.   4. CKD stage 3: Prior CTA abdomen/pelvis with high-grade stenosis of proximal right renal artery.  Creatinine stable. Improved to baseline today.   5.  CAD: S/p CABG 12/20.  LHC pre-VAD with patent grafts, no target for intervention. - Continue Crestor 20 mg daily.   6.  Atrial fibrillation: Paroxysmal.  S/p Maze and LA appendage clip with CABG in 12/20.  Now in NSR.  - Continue Toprol XL 25 mg daily.  - Off amiodarone with hyperthyroidism.  7. Type 2 diabetes: improved control, Hgb A1c down  from 12.1>>6.3  - continue SSI   8. 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 He says last use of methamphetamine was > 2 months ago. - UDS this admit negative (however collected > 48 hrs post admit)  - Declines substance use resources  9. 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  Followed by endocrinology. He is now off amiodarone. He had been on methimazole but has not been taking recently. TSH was high on admit and free T4 normal  - Will NOT restart methimazole   10. Anemia (chronic blood loss):  Hgb stable.   11. COPD: Prior smoker.  - Continue Trelegy.   12. PVCs/NSVT:  - Now off amiodarone.   13.  Depression: Has turned to substance abuse to cope.   - Has been referred to OP psych in [place]   14.  Gout: Continue allopurinol, he has prn colchicine.   15.  HTN:  - Monitor closely, if BP remains an issue may need reassessment of renal artery stenosis and possible treatment. Significant calcifications in both renal arteries on CT abdomen this admit. - MAPs stable in 80s - Continue Amlodipine 10 mg.  - Hold spiro and losartan as above   16. Agitation: Klonopin increased, agitation improved  17. Incisional hernia: Large abdominal wall hernia in close proximity to driveline. Cannot tolerate wearing abdominal binder.  Has chronic discomfort from the hernia and the driveline.  It is not incarcerated.  - Continue oxycodone at low dose. Alternating with Tramadol. - No good option for hernia repair given driveline proximity.         I reviewed the LVAD parameters from today, and compared the results to the patient's prior recorded data.  No programming changes were made.  The LVAD is functioning within specified parameters.  The patient performs LVAD self-test daily.  LVAD interrogation was negative for any significant power changes, alarms or PI events/speed drops.  LVAD equipment check completed and is in good working order.  Back-up equipment  present.   LVAD education done on emergency procedures and precautions and reviewed exit site care.  Length of Stay: [address] DO 08/03/2023, 11:30 AM  VAD Team --- VAD ISSUES ONLY--- Pager [phone] (7am - 7am)  Advanced Heart Failure Team  Pager [phone] (M-F; 7a - 5p)  Please contact [facility] for night-coverage after hours (5p -7a ) and weekends on [email or web address]

## 2023-08-03 NOTE — Progress Notes (Signed)
ANTICOAGULATION CONSULT NOTE  Pharmacy Consult for warfarin  Indication:  LVAD  Allergies  Allergen Reactions   Vancomycin Nausea And Vomiting and Other (See Comments)    Severe joint pain    Patient Measurements: Height: 5\' 10"  (177.8 cm) Weight: 94.1 kg (207 lb 7.3 oz) IBW/kg (Calculated) : 73 Heparin Dosing Weight: 91.4 kg   Vital Signs: Temp: 98 F (36.7 C) (08/25 0740) Temp Source: Oral (08/25 0740) BP: 114/77 (08/25 0740) Pulse Rate: 80 (08/25 0740)  Labs: Recent Labs    08/01/23 1610 08/02/23 0655 08/03/23 0650  HGB 9.7* 10.6* 10.6*  HCT 29.7* 33.1* 33.4*  PLT 176 229 246  LABPROT 28.3* 24.0* 24.0*  INR 2.6* 2.1* 2.1*  CREATININE 1.46* 1.14 1.04    Estimated Creatinine Clearance: 84.8 mL/min (by C-G formula based on SCr of 1.04 mg/dL).   Medical History: Past Medical History:  Diagnosis Date   "    Arthritis    CAD (coronary artery disease)    a. s/p CABG in 11/2019 with LIMA-LAD, SVG-OM1, SVG-PDA and SVG-D1   CHF (congestive heart failure) (HCC)    a. EF < 20% by echo in 11/2019   Essential hypertension    PAF (paroxysmal atrial fibrillation) (HCC)    Type 2 diabetes mellitus (HCC)     Medications:  Scheduled:   allopurinol  100 mg Oral Daily   amLODipine  10 mg Oral Daily   clonazepam  0.5 mg Oral BID   fluticasone furoate-vilanterol  1 puff Inhalation Daily   And   umeclidinium bromide  1 puff Inhalation Daily   gabapentin  300 mg Oral BID   insulin aspart  0-20 Units Subcutaneous TID WC   insulin aspart  0-5 Units Subcutaneous QHS   insulin glargine-yfgn  14 Units Subcutaneous Daily   losartan  12.5 mg Oral Daily   metoprolol succinate  25 mg Oral Daily   minocycline  200 mg Oral BID   mirtazapine  15 mg Oral QHS   pantoprazole  40 mg Oral Daily   sertraline  50 mg Oral Daily   tamsulosin  0.4 mg Oral Daily   thiamine  100 mg Oral Daily   traZODone  150 mg Oral QHS   [START ON 08/05/2023] warfarin  4 mg Oral Once per day on Tuesday  Thursday   Warfarin - Pharmacist Dosing Inpatient   Does not apply q1600   zinc sulfate  220 mg Oral Daily    Assessment: 62 yom who presented to clinic with concern for driveline fracture and infection. Last [facility] appointment was on 04/21/2023 - INR was subtherapeutic at 1.4 and regimen at that time was 6 mg daily except 4 mg Tues/Thurs (LD 7/25). Given low INR on admit, started fixed rate heparin 500 units/hr - now stopped.   INR is at goal today at 2.1.  CBC and LDH stable.  Goal of Therapy:  INR 2-2.5 Monitor platelets by anticoagulation protocol: Yes   Plan:  Continue home dose of warfarin - 6 mg daily except 4 mg on Tues and Thurs. Monitor daily INR, CBC, LDH, and for s/sx of bleeding   Reece Leader, Colon Flattery, [facility] Clinical Pharmacist  08/03/2023 11:48 AM   [facility] pharmacy phone numbers are listed on [email or web address]

## 2023-08-04 DIAGNOSIS — T827XXA Infection and inflammatory reaction due to other cardiac and vascular devices, implants and grafts, initial encounter: Secondary | ICD-10-CM | POA: Diagnosis not present

## 2023-08-04 LAB — GLUCOSE, CAPILLARY
Glucose-Capillary: 112 mg/dL — ABNORMAL HIGH (ref 70–99)
Glucose-Capillary: 152 mg/dL — ABNORMAL HIGH (ref 70–99)
Glucose-Capillary: 132 mg/dL — ABNORMAL HIGH (ref 70–99)
Glucose-Capillary: 108 mg/dL — ABNORMAL HIGH (ref 70–99)

## 2023-08-04 LAB — CBC
HCT: 31.6 % — ABNORMAL LOW (ref 39.0–52.0)
Hemoglobin: 10.2 g/dL — ABNORMAL LOW (ref 13.0–17.0)
MCH: 27.1 pg (ref 26.0–34.0)
MCHC: 32.3 g/dL (ref 30.0–36.0)
MCV: 83.8 fL (ref 80.0–100.0)
Platelets: 228 10*3/uL (ref 150–400)
RBC: 3.77 MIL/uL — ABNORMAL LOW (ref 4.22–5.81)
RDW: 14 % (ref 11.5–15.5)
WBC: 7.5 10*3/uL (ref 4.0–10.5)
nRBC: 0 % (ref 0.0–0.2)

## 2023-08-04 LAB — BASIC METABOLIC PANEL
Anion gap: 10 (ref 5–15)
BUN: 27 mg/dL — ABNORMAL HIGH (ref 8–23)
CO2: 25 mmol/L (ref 22–32)
Calcium: 9 mg/dL (ref 8.9–10.3)
Chloride: 99 mmol/L (ref 98–111)
Creatinine, Ser: 1.17 mg/dL (ref 0.61–1.24)
GFR, Estimated: >60 mL/min (ref >60)
Glucose, Bld: 170 mg/dL — ABNORMAL HIGH (ref 70–99)
Potassium: 4 mmol/L (ref 3.5–5.1)
Sodium: 134 mmol/L — ABNORMAL LOW (ref 135–145)

## 2023-08-04 LAB — PROTIME-INR
INR: 2.2 — ABNORMAL HIGH (ref 0.8–1.2)
Prothrombin Time: 24.3 s — ABNORMAL HIGH (ref 11.4–15.2)

## 2023-08-04 LAB — LACTATE DEHYDROGENASE: LDH: 153 U/L (ref 98–192)

## 2023-08-04 LAB — MAGNESIUM: Magnesium: 1.8 mg/dL (ref 1.7–2.4)

## 2023-08-04 MED ORDER — MAGNESIUM SULFATE 2 GM/50ML IV SOLN
2.0000 g | Freq: Once | INTRAVENOUS | Status: AC
  Administered 2023-08-04: 2 g via INTRAVENOUS
  Filled 2023-08-04: qty 50

## 2023-08-04 NOTE — TOC Progression Note (Signed)
Transition of Care (TOC) - Progression Note    Patient Details  Name: Kevin Underwood. MRN: [id number] Date of Birth: 20-Dec-1960  Transition of Care [facility]) CM/SW Contact  Nicanor Bake Phone Number: [phone] 08/04/2023, 11:43 AM  Clinical Narrative:  HF CSW attempted to meet with pt at the bedside.  CSW attempted to speak with pt but pt stayed sleep after several attempts of waking up. CSW will follow up with pt at a later time. TOC will continue following.     Expected Discharge Plan: Home w Home Health Services Barriers to Discharge: Continued Medical Work up  Expected Discharge Plan and Services   Discharge Planning Services: CM Consult Post Acute Care Choice: Home Health Living arrangements for the past 2 months: Single Family Home                           HH Arranged: RN, PT           Social Determinants of Health (SDOH) Interventions SDOH Screenings   Food Insecurity: No Food Insecurity (07/08/2023)  Housing: Low Risk  (07/08/2023)  Transportation Needs: No Transportation Needs (07/08/2023)  Utilities: Not At Risk (07/08/2023)  Alcohol Screen: Low Risk  (11/10/2020)  Depression (PHQ2-9): Low Risk  (10/16/2022)  Financial Resource Strain: Medium Risk (05/17/2022)  Physical Activity: Inactive (11/10/2020)  Tobacco Use: Medium Risk (07/09/2023)    Readmission Risk Interventions    07/01/2022    9:05 AM 11/28/2020   11:57 AM  Readmission Risk Prevention Plan  Transportation Screening Complete Complete  Medication Review Oceanographer) Complete Complete  PCP or Specialist appointment within 3-5 days of discharge Complete   HRI or Home Care Consult Complete Complete  SW Recovery Care/Counseling Consult Complete Complete  Palliative Care Screening Not Applicable Not Applicable  Skilled Nursing Facility Not Applicable Not Applicable

## 2023-08-04 NOTE — Plan of Care (Signed)
  Problem: Education: Goal: Patient will understand all VAD equipment and how it functions Outcome: Progressing Goal: Patient will be able to verbalize current INR target range and antiplatelet therapy for discharge home Outcome: Progressing   Problem: Cardiac: Goal: LVAD will function as expected and patient will experience no clinical alarms Outcome: Progressing   Problem: Education: Goal: Knowledge of General Education information will improve Description: Including pain rating scale, medication(s)/side effects and non-pharmacologic comfort measures Outcome: Progressing   Problem: Health Behavior/Discharge Planning: Goal: Ability to manage health-related needs will improve Outcome: Progressing   Problem: Clinical Measurements: Goal: Ability to maintain clinical measurements within normal limits will improve Outcome: Progressing Goal: Will remain free from infection Outcome: Progressing Goal: Diagnostic test results will improve Outcome: Progressing Goal: Respiratory complications will improve Outcome: Progressing Goal: Cardiovascular complication will be avoided Outcome: Progressing   Problem: Activity: Goal: Risk for activity intolerance will decrease Outcome: Progressing   Problem: Nutrition: Goal: Adequate nutrition will be maintained Outcome: Progressing   Problem: Coping: Goal: Level of anxiety will decrease Outcome: Progressing   Problem: Elimination: Goal: Will not experience complications related to bowel motility Outcome: Progressing Goal: Will not experience complications related to urinary retention Outcome: Progressing   Problem: Pain Managment: Goal: General experience of comfort will improve Outcome: Progressing   Problem: Safety: Goal: Ability to remain free from injury will improve Outcome: Progressing   Problem: Skin Integrity: Goal: Risk for impaired skin integrity will decrease Outcome: Progressing   Problem: Education: Goal: Ability  to describe self-care measures that may prevent or decrease complications (Diabetes Survival Skills Education) will improve Outcome: Progressing Goal: Individualized Educational Video(s) Outcome: Progressing   Problem: Coping: Goal: Ability to adjust to condition or change in health will improve Outcome: Progressing   Problem: Fluid Volume: Goal: Ability to maintain a balanced intake and output will improve Outcome: Progressing   Problem: Health Behavior/Discharge Planning: Goal: Ability to identify and utilize available resources and services will improve Outcome: Progressing Goal: Ability to manage health-related needs will improve Outcome: Progressing   Problem: Metabolic: Goal: Ability to maintain appropriate glucose levels will improve Outcome: Progressing   Problem: Nutritional: Goal: Maintenance of adequate nutrition will improve Outcome: Progressing Goal: Progress toward achieving an optimal weight will improve Outcome: Progressing   Problem: Skin Integrity: Goal: Risk for impaired skin integrity will decrease Outcome: Progressing   Problem: Tissue Perfusion: Goal: Adequacy of tissue perfusion will improve Outcome: Progressing   

## 2023-08-04 NOTE — Progress Notes (Signed)
ANTICOAGULATION CONSULT NOTE  Pharmacy Consult for warfarin  Indication:  LVAD  Allergies  Allergen Reactions   Vancomycin Nausea And Vomiting and Other (See Comments)    Severe joint pain    Patient Measurements: Height: 5\' 10"  (177.8 cm) Weight: 94.5 kg (208 lb 5.4 oz) IBW/kg (Calculated) : 73 Heparin Dosing Weight: 91.4 kg   Vital Signs: Temp: 97.5 F (36.4 C) (08/26 0610) Temp Source: Oral (08/26 0610) BP: 114/96 (08/26 1026) Pulse Rate: 79 (08/26 1026)  Labs: Recent Labs    08/02/23 0655 08/03/23 0650 08/04/23 0904  HGB 10.6* 10.6* 10.2*  HCT 33.1* 33.4* 31.6*  PLT 229 246 228  LABPROT 24.0* 24.0*  --   INR 2.1* 2.1*  --   CREATININE 1.14 1.04 1.17    Estimated Creatinine Clearance: 75.6 mL/min (by C-G formula based on SCr of 1.17 mg/dL).   Medical History: Past Medical History:  Diagnosis Date   "    Arthritis    CAD (coronary artery disease)    a. s/p CABG in 11/2019 with LIMA-LAD, SVG-OM1, SVG-PDA and SVG-D1   CHF (congestive heart failure) (HCC)    a. EF < 20% by echo in 11/2019   Essential hypertension    PAF (paroxysmal atrial fibrillation) (HCC)    Type 2 diabetes mellitus (HCC)     Medications:  Scheduled:   allopurinol  100 mg Oral Daily   amLODipine  10 mg Oral Daily   clonazepam  0.5 mg Oral BID   fluticasone furoate-vilanterol  1 puff Inhalation Daily   And   umeclidinium bromide  1 puff Inhalation Daily   gabapentin  300 mg Oral BID   insulin aspart  0-20 Units Subcutaneous TID WC   insulin aspart  0-5 Units Subcutaneous QHS   insulin glargine-yfgn  14 Units Subcutaneous Daily   losartan  12.5 mg Oral Daily   metoprolol succinate  25 mg Oral Daily   minocycline  200 mg Oral BID   mirtazapine  15 mg Oral QHS   pantoprazole  40 mg Oral Daily   sertraline  50 mg Oral Daily   tamsulosin  0.4 mg Oral Daily   thiamine  100 mg Oral Daily   traZODone  150 mg Oral QHS   [START ON 08/05/2023] warfarin  4 mg Oral Once per day on  Tuesday Thursday   warfarin  6 mg Oral Once per day on Sunday Monday Wednesday Friday Saturday   Warfarin - Pharmacist Dosing Inpatient   Does not apply q1600   zinc sulfate  220 mg Oral Daily    Assessment: 62 yom who presented to clinic with concern for driveline fracture and infection. Last [facility] appointment was on 04/21/2023 - INR was subtherapeutic at 1.4 and regimen at that time was 6 mg daily except 4 mg Tues/Thurs (LD 7/25). Given low INR on admit, started fixed rate heparin 500 units/hr - now stopped.   INR is therapeutic at 2.2.  Goal of Therapy:  INR 2-2.5 Monitor platelets by anticoagulation protocol: Yes   Plan:  Continue prior home dose of warfarin - 6 mg daily except 4 mg on Tues and Thurs. Monitor daily INR, CBC, LDH, and for s/sx of bleeding   [name], PharmD, BCPS, [facility] Clinical Pharmacist [phone] Please check AMION for all [facility] Pharmacy numbers 08/04/2023

## 2023-08-04 NOTE — Progress Notes (Addendum)
Patient ID: [name], male   DOB: [birth year], 62 y.o.   MRN: [id number]   Advanced Heart Failure VAD Team Note  PCP-Cardiologist: Nona Dell, MD  [name]: Dr. Shirlee Latch   Subjective:    7/29: admitted w/ suspected DL fracture and DL infection. P/w "driveline communication fault" error message on LVAD. No pump stoppages. LVAD coordinator replaced modular cable and controller with resolution of driveline communication fault signal. CT C/A/P c/w prior imaging w/ similar appearance of mild circumferential thickening of the fat plane surrounding the drive line in the upper abdomen extending from the pump to the skin which may be chronic or represent an infectious process. No drainable fluid collection. Wound + BCx collected. Started on empiric daptomycin/Unasyn per ID.  7/30: Wound Cx growing Pseudomonas Aeruginosa (pan-sensitive) and S. aureus. Cefepime added 7/31: OR for wound debridement + application of wound vac.   8/8: Returned to OR for I&D  Remains on Daptomycin, Cefepime and minocycline. Surgical wound with pseudomonas aeruginosa and MRSA. Bcx from 7/29 and 7/31 NGTD.    Complain of ankle pain. He says this is nothing new.   LVAD INTERROGATION:  HeartMate III LVAD:   Flow 4.6 liters/min, speed 5600 power 4.1, PI  3.4. 15 PI events.   Objective:    Vital Signs:   Temp:  [97.4 F (36.3 C)-98 F (36.7 C)] 97.5 F (36.4 C) (08/26 0610) Pulse Rate:  [80-86] 81 (08/25 1612) Resp:  [10-24] 10 (08/26 0610) BP: (94-121)/(60-108) 118/98 (08/26 0610) SpO2:  [93 %-97 %] 93 % (08/26 0701) Weight:  [94.5 kg] 94.5 kg (08/26 0610) Last BM Date : 07/30/23 Mean arterial Pressure Maps 100s  Intake/Output:   Intake/Output Summary (Last 24 hours) at 08/04/2023 0715 Last data filed at 08/04/2023 0000 Gross per 24 hour  Intake 100 ml  Output --  Net 100 ml    Physical Exam  Physical Exam: GENERAL: No acute distress. HEENT: normal  NECK: Supple, JVP flat  .  2+ bilaterally, no  bruits.  No lymphadenopathy or thyromegaly appreciated.   CARDIAC:  Mechanical heart sounds with LVAD hum present.  LUNGS:  Clear to auscultation bilaterally.  ABDOMEN:  large hernia. Non tender, positive bowel sounds x4.     LVAD exit site:  Dressing dry and intact.   EXTREMITIES:  Warm and dry, no cyanosis, clubbing, rash or edema  NEUROLOGIC:  Alert and oriented x 3.    No aphasia.  No dysarthria.  Affect pleasant.     Telemetry  SR 80s   Labs   Basic Metabolic Panel: Recent Labs  Lab 07/30/23 0702 07/31/23 0543 08/01/23 0613 08/02/23 0655 08/03/23 0650  NA 134* 137 134* 130* 134*  K 4.0 3.8 4.1 4.3 4.3  CL 100 101 99 96* 98  CO2 22 25 25 23 25   GLUCOSE 151* 158* 164* 232* 123*  BUN 21 24* 28* 26* 24*  CREATININE 0.92 0.96 1.46* 1.14 1.04  CALCIUM 9.0 9.2 8.6* 8.7* 9.3  MG 1.7 1.9 2.1 1.9 1.9    Liver Function Tests: No results for input(s): "AST", "ALT", "ALKPHOS", "BILITOT", "PROT", "ALBUMIN" in the last 168 hours.  CBC: Recent Labs  Lab 07/30/23 0702 07/31/23 0543 08/01/23 0613 08/02/23 0655 08/03/23 0650  WBC 7.0 6.7 6.8 7.2 7.3  HGB 11.1* 11.7* 9.7* 10.6* 10.6*  HCT 34.3* 36.3* 29.7* 33.1* 33.4*  MCV 86.4 87.1 86.6 85.5 84.8  PLT 173 212 176 229 246    INR: Recent Labs  Lab 07/30/23 0702 07/31/23  0543 08/01/23 6962 08/02/23 0655 08/03/23 0650  INR 2.2* 2.1* 2.6* 2.1* 2.1*    Other results:    Imaging   No results found.   Medications:     Scheduled Medications:  allopurinol  100 mg Oral Daily   amLODipine  10 mg Oral Daily   clonazepam  0.5 mg Oral BID   fluticasone furoate-vilanterol  1 puff Inhalation Daily   And   umeclidinium bromide  1 puff Inhalation Daily   gabapentin  300 mg Oral BID   insulin aspart  0-20 Units Subcutaneous TID WC   insulin aspart  0-5 Units Subcutaneous QHS   insulin glargine-yfgn  14 Units Subcutaneous Daily   losartan  12.5 mg Oral Daily   metoprolol succinate  25 mg Oral Daily   minocycline   200 mg Oral BID   mirtazapine  15 mg Oral QHS   pantoprazole  40 mg Oral Daily   sertraline  50 mg Oral Daily   tamsulosin  0.4 mg Oral Daily   thiamine  100 mg Oral Daily   traZODone  150 mg Oral QHS   [START ON 08/05/2023] warfarin  4 mg Oral Once per day on Tuesday Thursday   warfarin  6 mg Oral Once per day on Sunday Monday Wednesday Friday Saturday   Warfarin - Pharmacist Dosing Inpatient   Does not apply q1600   zinc sulfate  220 mg Oral Daily    Infusions:  ceFEPime (MAXIPIME) IV 2 g (08/04/23 9528)   DAPTOmycin (CUBICIN) 800 mg in sodium chloride 0.9 % IVPB 800 mg (08/03/23 1401)    PRN Medications: acetaminophen, fluticasone, oxyCODONE, traMADol   Patient Profile  62 y.o. 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 Now s/p Heartmate 3 LVAD. Now admitted w/ suspected DL fracture after multiple falls and recurrent DL infection.  Assessment/Plan:   1.  LVAD Driveline infection: MRSA driveline infection, admitted in 6/23 and again in 7/23-8/23 with multiple trips to the OR for debridement.  Cultures have grown acinetobacter and MRSA. He was readmitted with driveline infection in 2/24, completed course of daptomycin for MRSA. Now readmitted w/ suspected DL fracture and DL infection. P/w "driveline communication fault" error message on LVAD.LVAD coordinator replaced modular cable and controller with resolution of driveline communication fault signal. CT C/A/P c/w prior imaging w/ similar appearance of mild circumferential thickening of the fat plane surrounding the drive line in the upper abdomen extending from the pump to the skin which may be chronic or represent an infectious process. No drainable fluid collection.  -Initial wound Cx + for Pseudomonas Aeruginosa (pan-sensitive) and S.  aureus. Now on Dapto, Cefepime and minocycline. BCx NGTD. Taken to OR 7/31 for wound debridement + application of wound vac. Returned to OR 8/7 for wound wash out/debridement - Continue abx per ID. Appreciate their assistance. ID recommends 4 weeks of IV antibiotics. He is willing to stay in the hospital for another 2 weeks. - Pain well controlled improved with oxycodone and tramadol.  - Will need pain management once discharged.  2. Suspected DL fracture with multiple recent falls - Has had multiple falls during the last 2 months (prior to admit), one direct trauma to mid sternum (fell on cinder block) - CT chest/abd/pelvis per above  - CXR w/ LVAD partially imaged. The associated lead noted  to be intact w/ no fracture or kink  - LVAD coordinators changed modular cable and controller with resolution  - No alarmas.   3.  Chronic systolic CHF s/p LVAD: Echo 10/21 with EF 20-25%, mildly decreased RV function. LHC/RHC in 12/21 with patent grafts, low output. Suspect mixed ischemic/nonischemic cardiomyopathy (prior heavy ETOH and drugs as well as CAD). Admitted with cardiogenic shock in 12/21, had placement of Impella 5.5 initially, now s/p Heartmate 3 LVAD on 11/17/20.  Ramp echo 2/23 with speed decreased to 5500 rpm. Speed now back to 5600 rpm.  He is not volume overloaded on exam. LVAD parameters stable.  - Continue current Toprol XL 25 mg daily.  - Did not tolerate Entresto in the past d/t AKI and low MAPs.  - MAPs elevated. Add losartan 12.5 mg daily if renal function ok.  - INR/BMET/LDH pending.   4. CKD stage 3: Prior CTA abdomen/pelvis with high-grade stenosis of proximal right renal artery.  BMET pending.   5.  CAD: S/p CABG 12/20.  LHC pre-VAD with patent grafts, no target for intervention. - Continue Crestor 20 mg daily.   6.  Atrial fibrillation: Paroxysmal.  S/p Maze and LA appendage clip with CABG in 12/20.  Now in NSR.  - Continue Toprol XL 25 mg daily.  - Off amiodarone with  hyperthyroidism.  7. Type 2 diabetes: improved control, Hgb A1c down from 12.1>>6.3  - continue SSI   8. 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 He says last use of methamphetamine was > 2 months ago. - UDS this admit negative (however collected > 48 hrs post admit)  - Declines substance use resources  9. 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  Followed by endocrinology. He is now off amiodarone. He had been on methimazole but has not been taking recently. TSH was high on admit and free T4 normal  - Will NOT restart methimazole   10. Anemia (chronic blood loss):  CBC pending. .   11. COPD: Prior smoker.  - Continue Trelegy.   12. PVCs/NSVT:  - Now off amiodarone.   13.  Depression: Has turned to substance abuse to cope.   - Has been referred to OP psych in [place]   14.  Gout: Continue allopurinol, he has prn colchicine.   15.  HTN:  - Elevated. May need to add losartan if renal function ok.  - Continue Amlodipine 10 mg.   16. Agitation: Klonopin increased, agitation improved  17. Incisional hernia: Large abdominal wall hernia in close proximity to driveline. Cannot tolerate wearing abdominal binder.  Has chronic discomfort from the hernia and the driveline.  It is not incarcerated.  - Continue oxycodone at low dose. Alternating with Tramadol. - No good option for hernia repair given driveline proximity.   Labs pending.   I reviewed the LVAD parameters from today, and compared the results to the patient's prior recorded data.  No programming changes were made.  The LVAD is functioning within specified parameters.  The patient performs LVAD self-test daily.  LVAD interrogation was negative for any significant power changes, alarms or PI events/speed drops.  LVAD equipment check completed and is in good working order.  Back-up equipment present.   LVAD education done on emergency procedures and precautions and reviewed exit site care.  Length of  Stay: 14  Tonye Becket, NP 08/04/2023, 7:15 AM  VAD Team --- VAD ISSUES ONLY--- Pager [phone] (7am - 7am)  Advanced Heart Failure Team  Pager [phone] (M-F; 7a -  5p)  Please contact [facility] for night-coverage after hours (5p -7a ) and weekends on [email or web address]  Patient seen with NP, agree with the above note.   No complaints this morning.  Remains on IV abx.  LVAD parameters stable.   General: Well appearing this am. NAD.  HEENT: Normal. Neck: Supple, JVP 7-8 cm. Carotids OK.  Cardiac:  Mechanical heart sounds with LVAD hum present.  Lungs:  CTAB, normal effort.  Abdomen:  NT, ND, no HSM. No bruits or masses. +BS. Large ventral hernia.  LVAD exit site: Driveline site dressed.  Extremities:  Warm and dry. No cyanosis, clubbing, rash, or edema.  Neuro:  Alert & oriented x 3. Cranial nerves grossly intact. Moves all 4 extremities w/o difficulty. Affect pleasant    Patient is stable.  Plan to complete 4 weeks IV abx in hospital before discharge.  MAP 90s-100s generally.  Volume status ok.   Marca Ancona 08/04/2023 10:59 AM

## 2023-08-04 NOTE — Progress Notes (Signed)
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 Started on IV antibiotics.   Patient presented to VAD clinic 7/29 for driveline communication fault- stating that he has had "alarms every couple hrs from his VAD for a couple months." Pt has no showed and cancelled all appointments in the past 2 months. Pt also had green/blue drainage visible through his driveline bandage. (Dressing change and wound cx obtained). Pt states that he has experienced dizziness associated with a fall multiple times over the past couple months but no signs of bleeding. Controller and modular cable exchanged. No further communication fault alarms noted.   CT Chest/Abd/Pelvis 7/29 1. Similar appearance of mild circumferential thickening of the fat plane surrounding the drive line in the upper abdomen extending from the pump to the skin which may be chronic or represent an infectious process. No drainable fluid collection.  Pt had drive line debridement in OR with placement of instillation wound vac on 07/09/23 with Dr Donata Clay. This was change to wet/dry on Friday 8/2 due to ongoing alarms related to the pts hernia.   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 Currently on IV Cefepime Q 8 hrs and IV Daptomycin 800 mg daily per ID team. ID team is recommending 4 weeks IV antibiotics. Pt is refusing to go a facility for IV antibiotics.   Pt lying in bed upon my arrival. ID recommending 2 more weeks of IV antibiotics. Per PVT we will reassess this week.   Vital signs: Temp: 97.5 HR: 71 Doppler Pressure: not documented Automatic BP: 118/98 (103) O2 Sat: 94% on RA Wt: 193.3>194.8>196.4>194.7>...>211.4>209.2>206.8>208.4>211.6>208.3 lbs  LVAD interrogation reveals:  Speed: 5600 Flow: 4.6 Power: 4.2 w PI: 2.8 Alarms: none Events: 50+ PI events Hct: 31  Fixed speed:  5600 Low speed limit:   5300  Drive Line: Existing VAD dressing removed and site care performed using sterile technique. Wound bed cleansed with VASHE solution and sterile 4x4 gauze through light manual debridement. Skin surrounding wound bed cleaned with Chlora prep applicators x 2, allowed to dry. VASHE moistened 4 x 4 placed in wound bed, covered with several dry 4 x 4s. Wound tunnels approx 2 cm.  Drive line unincorporated. Moderate amount of thick yellow/green drainage noted. No redness, tenderness, foul odor or rash noted.  Drive line anchor secured.   Continue daily wet to dry dressing changes using VASHE solution per bedside RN or VAD coordinator. Next dressing change due 08/05/23 by bedside nurse.       Labs:  LDH trend: 121>133>130>138>172>175>165>158>169>155>170>167>173>161>163>156>165>168>158>149>153  INR trend: 1.0>1.1>1.2>1.4>1.3>1.4>1.5>1.4>1.8>1.9>1.7>1.8>1.9>2.1>2.2>1.2>2.1>2.6>2.2  Anticoagulation Plan: -INR Goal: 2.0 - 2.5 -ASA Dose: none  ICD: N/A  Infection:  07/07/23>> blood cultures>> no growth 5 days; final  07/07/23>> wound cx>> few pseudomonas aeruginosa and MRSA; final  07/09/23>> OR driveline cx>> Few pseudomonas aeruginosa and METHICILLIN RESISTANT STAPHYLOCOCCUS AUREUS 07/09/23>> blood cultures>>no growth x 5 days; final  Drips:    Plan/Recommendations:  Page VAD coordinator with equipment issues or driveline problems Daily drive line wet to dry dressing changes with VASHE solution.   Carlton Adam RN,BSN VAD Coordinator  Office: [phone]  24/7 Pager: [phone]

## 2023-08-05 DIAGNOSIS — T827XXA Infection and inflammatory reaction due to other cardiac and vascular devices, implants and grafts, initial encounter: Secondary | ICD-10-CM | POA: Diagnosis not present

## 2023-08-05 LAB — PROTIME-INR
INR: 2.1 — ABNORMAL HIGH (ref 0.8–1.2)
Prothrombin Time: 23.7 s — ABNORMAL HIGH (ref 11.4–15.2)

## 2023-08-05 LAB — GLUCOSE, CAPILLARY
Glucose-Capillary: 142 mg/dL — ABNORMAL HIGH (ref 70–99)
Glucose-Capillary: 111 mg/dL — ABNORMAL HIGH (ref 70–99)
Glucose-Capillary: 144 mg/dL — ABNORMAL HIGH (ref 70–99)
Glucose-Capillary: 112 mg/dL — ABNORMAL HIGH (ref 70–99)

## 2023-08-05 LAB — CBC
HCT: 30.7 % — ABNORMAL LOW (ref 39.0–52.0)
Hemoglobin: 9.9 g/dL — ABNORMAL LOW (ref 13.0–17.0)
MCH: 27 pg (ref 26.0–34.0)
MCHC: 32.2 g/dL (ref 30.0–36.0)
MCV: 83.9 fL (ref 80.0–100.0)
Platelets: 255 10*3/uL (ref 150–400)
RBC: 3.66 MIL/uL — ABNORMAL LOW (ref 4.22–5.81)
RDW: 13.9 % (ref 11.5–15.5)
WBC: 6.9 10*3/uL (ref 4.0–10.5)
nRBC: 0 % (ref 0.0–0.2)

## 2023-08-05 LAB — BASIC METABOLIC PANEL
Anion gap: 11 (ref 5–15)
BUN: 30 mg/dL — ABNORMAL HIGH (ref 8–23)
CO2: 24 mmol/L (ref 22–32)
Calcium: 9.1 mg/dL (ref 8.9–10.3)
Chloride: 100 mmol/L (ref 98–111)
Creatinine, Ser: 1.19 mg/dL (ref 0.61–1.24)
GFR, Estimated: >60 mL/min (ref >60)
Glucose, Bld: 89 mg/dL (ref 70–99)
Potassium: 4.1 mmol/L (ref 3.5–5.1)
Sodium: 135 mmol/L (ref 135–145)

## 2023-08-05 LAB — CK: Total CK: 48 U/L — ABNORMAL LOW (ref 49–397)

## 2023-08-05 LAB — MAGNESIUM: Magnesium: 2 mg/dL (ref 1.7–2.4)

## 2023-08-05 LAB — LACTATE DEHYDROGENASE: LDH: 167 U/L (ref 98–192)

## 2023-08-05 NOTE — Plan of Care (Signed)
  Problem: Education: Goal: Patient will understand all VAD equipment and how it functions Outcome: Progressing Goal: Patient will be able to verbalize current INR target range and antiplatelet therapy for discharge home Outcome: Progressing   Problem: Cardiac: Goal: LVAD will function as expected and patient will experience no clinical alarms Outcome: Progressing   Problem: Education: Goal: Knowledge of General Education information will improve Description: Including pain rating scale, medication(s)/side effects and non-pharmacologic comfort measures Outcome: Progressing   Problem: Health Behavior/Discharge Planning: Goal: Ability to manage health-related needs will improve Outcome: Progressing   Problem: Clinical Measurements: Goal: Ability to maintain clinical measurements within normal limits will improve Outcome: Progressing Goal: Will remain free from infection Outcome: Progressing Goal: Diagnostic test results will improve Outcome: Progressing Goal: Respiratory complications will improve Outcome: Progressing Goal: Cardiovascular complication will be avoided Outcome: Progressing   Problem: Activity: Goal: Risk for activity intolerance will decrease Outcome: Progressing   Problem: Nutrition: Goal: Adequate nutrition will be maintained Outcome: Progressing   Problem: Coping: Goal: Level of anxiety will decrease Outcome: Progressing   Problem: Elimination: Goal: Will not experience complications related to bowel motility Outcome: Progressing Goal: Will not experience complications related to urinary retention Outcome: Progressing   Problem: Pain Managment: Goal: General experience of comfort will improve Outcome: Progressing   Problem: Safety: Goal: Ability to remain free from injury will improve Outcome: Progressing   Problem: Skin Integrity: Goal: Risk for impaired skin integrity will decrease Outcome: Progressing   Problem: Education: Goal: Ability  to describe self-care measures that may prevent or decrease complications (Diabetes Survival Skills Education) will improve Outcome: Progressing Goal: Individualized Educational Video(s) Outcome: Progressing   Problem: Coping: Goal: Ability to adjust to condition or change in health will improve Outcome: Progressing   Problem: Fluid Volume: Goal: Ability to maintain a balanced intake and output will improve Outcome: Progressing   Problem: Health Behavior/Discharge Planning: Goal: Ability to identify and utilize available resources and services will improve Outcome: Progressing Goal: Ability to manage health-related needs will improve Outcome: Progressing   Problem: Metabolic: Goal: Ability to maintain appropriate glucose levels will improve Outcome: Progressing   Problem: Nutritional: Goal: Maintenance of adequate nutrition will improve Outcome: Progressing Goal: Progress toward achieving an optimal weight will improve Outcome: Progressing   Problem: Skin Integrity: Goal: Risk for impaired skin integrity will decrease Outcome: Progressing   Problem: Tissue Perfusion: Goal: Adequacy of tissue perfusion will improve Outcome: Progressing   

## 2023-08-05 NOTE — Progress Notes (Signed)
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 Started on IV antibiotics.   Patient presented to VAD clinic 7/29 for driveline communication fault- stating that he has had "alarms every couple hrs from his VAD for a couple months." Pt has no showed and cancelled all appointments in the past 2 months. Pt also had green/blue drainage visible through his driveline bandage. (Dressing change and wound cx obtained). Pt states that he has experienced dizziness associated with a fall multiple times over the past couple months but no signs of bleeding. Controller and modular cable exchanged. No further communication fault alarms noted.   CT Chest/Abd/Pelvis 7/29 1. Similar appearance of mild circumferential thickening of the fat plane surrounding the drive line in the upper abdomen extending from the pump to the skin which may be chronic or represent an infectious process. No drainable fluid collection.  Pt had drive line debridement in OR with placement of instillation wound vac on 07/09/23 with Dr Donata Clay. This was change to wet/dry on Friday 8/2 due to ongoing alarms related to the pts hernia.   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 Currently on IV Cefepime Q 8 hrs and IV Daptomycin 800 mg daily per ID team. ID team is recommending 4 weeks IV antibiotics. Pt is refusing to go a facility for IV antibiotics.   Pt lying in bed upon my arrival. ID recommending 2 more weeks of IV antibiotics. Per PVT we will reassess this week.   Vital signs: Temp: 97.6 HR: 85 Doppler Pressure: not documented Automatic BP: 113/81 (92) O2 Sat: 94% on RA Wt: 193.3>194.8>196.4>194.7>...>211.4>209.2>206.8>208.4>211.6>208.3>208.8 lbs  LVAD interrogation reveals:  Speed: 5600 Flow: 4.5 Power: 4.2 w PI: 2.8 Alarms: none Events: 50+ PI events Hct: 31  Fixed speed:  5600 Low speed limit:   5300  Drive Line: Existing VAD dressing removed and site care performed using sterile technique. Wound bed cleansed with VASHE solution and sterile 4x4 gauze through light manual debridement. Skin surrounding wound bed cleaned with Chlora prep applicators x 2, allowed to dry. VASHE moistened 4 x 4 placed in wound bed, covered with several dry 4 x 4s. Wound tunnels approx 2 cm.  Drive line unincorporated. Small amount of thick yellow/green drainage noted. No redness, tenderness, foul odor or rash noted.  Drive line anchor secured.   Continue daily wet to dry dressing changes using VASHE solution per bedside RN or VAD coordinator. Next dressing change due 08/06/23 by bedside nurse.       Labs:  LDH trend: 121>133>130>138>172>175>165>158>169>155>170>167>173>161>163>156>165>168>158>149>153>167  INR trend: 1.0>1.1>1.2>1.4>1.3>1.4>1.5>1.4>1.8>1.9>1.7>1.8>1.9>2.1>2.2>1.2>2.1>2.6>2.2>2.1  Anticoagulation Plan: -INR Goal: 2.0 - 2.5 -ASA Dose: none  ICD: N/A  Infection:  07/07/23>> blood cultures>> no growth 5 days; final  07/07/23>> wound cx>> few pseudomonas aeruginosa and MRSA; final  07/09/23>> OR driveline cx>> Few pseudomonas aeruginosa and METHICILLIN RESISTANT STAPHYLOCOCCUS AUREUS 07/09/23>> blood cultures>>no growth x 5 days; final  Drips:    Plan/Recommendations:  Page VAD coordinator with equipment issues or driveline problems Daily drive line wet to dry dressing changes with VASHE solution.   Carlton Adam RN,BSN VAD Coordinator  Office: [phone]  24/7 Pager: [phone]

## 2023-08-05 NOTE — Progress Notes (Signed)
Pharmacy Antibiotic Note  Kevin Underwood. is a 61 y.o. male admitted on 07/07/2023 with  driveline infection . Pharmacy has been consulted for daptomycin and cefepime dosing. Pt also continued on PTA minocycline for hx acinetobacter DLI.  Cr and CK stable. Wound cultures growing Pseudomonas and MRSA, blood cultures negative. Planning IV ABX x4 weeks total.  Plan: Daptomycin 800 mg every 24 hours Cefepime 2g IV every 8 hours Minocycline 200 mg BID F/u blood and driveline cx and continue to monitor renal function Monitor CK weekly  Height: 5\' 10"  (177.8 cm) Weight: 94.7 kg (208 lb 12.8 oz) IBW/kg (Calculated) : 73  Temp (24hrs), Avg:97.8 F (36.6 C), Min:97.6 F (36.4 C), Max:98 F (36.7 C)  Recent Labs  Lab 07/31/23 0543 08/01/23 0613 08/02/23 0655 08/03/23 0650 08/04/23 0904  WBC 6.7 6.8 7.2 7.3 7.5  CREATININE 0.96 1.46* 1.14 1.04 1.17    Estimated Creatinine Clearance: 75.6 mL/min (by C-G formula based on SCr of 1.17 mg/dL).    Allergies  Allergen Reactions   Vancomycin Nausea And Vomiting and Other (See Comments)    Severe joint pain    Antimicrobials this admission: 7/29 Daptomycin >>  7/29 Unasyn >> 7/31 7/31 Minocycline >>  7/31 Cefepime >>   Microbiology results: 7/29 BCx: negative 7/29 Driveline Wound Cx: pseudomonas/MRSA  7/31 Driveline abscess cx: pseudomonas (pan-sensitive)/MRSA  [name], PharmD, BCPS, [facility] Clinical Pharmacist [phone] Please check AMION for all [facility] Pharmacy numbers 08/05/2023

## 2023-08-05 NOTE — Progress Notes (Signed)
19 Days Post-Op Procedure(s) (LRB): DRIVELINE WOUND DEBRIDEMENT (N/A) Subjective: Patient examined, wound status reviewed with VAD coordinator for coordination of care. Goal of care is to control the indolent VAD tunnel infection which is growing out 2 different organisms.  Antibiotic therapy directed by ID.  The current VAD tunnel wound is now fairly small and packed daily with Vashe wound solution wet-to-dry.  The goal is to control the infection and prevent pump pocket involvement--the patient is not a candidate for further proximal debridement which would necessitate VAD pump removal or exchange.  Objective: Vital signs in last 24 hours: Temp:  [97.6 F (36.4 C)-98 F (36.7 C)] 97.6 F (36.4 C) (08/27 0724) Pulse Rate:  [81-86] 81 (08/27 0853) Cardiac Rhythm: Normal sinus rhythm (08/27 0714) Resp:  [11-20] 17 (08/27 0853) BP: (81-138)/(70-94) 113/81 (08/27 0724) SpO2:  [94 %-95 %] 95 % (08/27 0244) Weight:  [94.7 kg] 94.7 kg (08/27 0556)  Hemodynamic parameters for last 24 hours:    Intake/Output from previous day: 08/26 0701 - 08/27 0700 In: 656 [P.O.:240; IV Piggyback:416] Out: -  Intake/Output this shift: No intake/output data recorded.  Exam Alert and comfortable VAD tunnel wound dressing dry and intact Normal VAD hum nsr rhythm Lungs clear   Lab Results: Recent Labs    08/04/23 0904 08/05/23 0803  WBC 7.5 6.9  HGB 10.2* 9.9*  HCT 31.6* 30.7*  PLT 228 255   BMET:  Recent Labs    08/04/23 0904 08/05/23 0803  NA 134* 135  K 4.0 4.1  CL 99 100  CO2 25 24  GLUCOSE 170* 89  BUN 27* 30*  CREATININE 1.17 1.19  CALCIUM 9.0 9.1    PT/INR:  Recent Labs    08/05/23 0803  LABPROT 23.7*  INR 2.1*   ABG    Component Value Date/Time   PHART 7.508 (H) 11/20/2020 0438   HCO3 24.8 09/09/2021 0313   TCO2 29 11/20/2020 0438   ACIDBASEDEF 6.0 (H) 11/24/2019 1718   O2SAT 63.4 09/09/2021 0313   CBG (last 3)  Recent Labs    08/04/23 1624 08/04/23 2113  08/05/23 0605  GLUCAP 132* 108* 142*    Assessment/Plan: S/P Procedure(s) (LRB): DRIVELINE WOUND DEBRIDEMENT (N/A) Complete course of IV antibiotics in hospital Daily VAD tunnel dressing change with Vashe wet-to-dry   LOS: 29 days    Lovett Sox 08/05/2023

## 2023-08-05 NOTE — Progress Notes (Addendum)
Patient ID: Kevin Rigg., male   DOB: [birth year], 62 y.o.   MRN: [id number]   Advanced Heart Failure VAD Team Note  PCP-Cardiologist: Kevin Dell, MD  [facility]: Dr. Shirlee Underwood   Subjective:    7/29: admitted w/ suspected DL fracture and DL infection. P/w "driveline communication fault" error message on LVAD. No pump stoppages. LVAD coordinator replaced modular cable and controller with resolution of driveline communication fault signal. CT C/A/P c/w prior imaging w/ similar appearance of mild circumferential thickening of the fat plane surrounding the drive line in the upper abdomen extending from the pump to the skin which may be chronic or represent an infectious process. No drainable fluid collection. Wound + BCx collected. Started on empiric daptomycin/Unasyn per ID.  7/30: Wound Cx growing Pseudomonas Aeruginosa (pan-sensitive) and S. aureus. Cefepime added 7/31: OR for wound debridement + application of wound vac.   8/8: Returned to OR for I&D  Remains on Daptomycin, Cefepime and minocycline. Surgical wound with pseudomonas aeruginosa and MRSA. Bcx from 7/29 and 7/31 NGTD.   Yesterday Maps elevated. Started on 12.5 mg losartan.   Denies SOB. Slept well.   LVAD INTERROGATION:  HeartMate III LVAD:   Flow 4.6 liters/min, speed 5600 power 4 PI  2.6 . On batteries.   Objective:    Vital Signs:   Temp:  [97.7 F (36.5 C)-98 F (36.7 C)] 97.9 F (36.6 C) (08/27 0244) Pulse Rate:  [79-86] 86 (08/27 0244) Resp:  [11-20] 17 (08/27 0556) BP: (81-138)/(70-96) 100/76 (08/27 0244) SpO2:  [94 %-95 %] 95 % (08/27 0244) Weight:  [94.7 kg] 94.7 kg (08/27 0556) Last BM Date : 08/03/23 Mean arterial Pressure 80-90s Intake/Output:   Intake/Output Summary (Last 24 hours) at 08/05/2023 4098 Last data filed at 08/05/2023 0612 Gross per 24 hour  Intake 656 ml  Output --  Net 656 ml    Physical Exam   Physical Exam: GENERAL: No acute distress. HEENT: normal  NECK: Supple, JVP  flat  .   2+ bilaterally, no bruits.  No lymphadenopathy or thyromegaly appreciated.   CARDIAC:  Mechanical heart sounds with LVAD hum present.  LUNGS:  Clear to auscultation bilaterally.  ABDOMEN:  Abdominal binder on. Large Hernia, soft, round, nontender, positive bowel sounds x4.     LVAD exit site: well-healed and incorporated.  Dressing dry and intact.  No erythema or drainage.  Stabilization device present and accurately applied.  Driveline dressing is being changed daily per sterile technique. EXTREMITIES:  Warm and dry, no cyanosis, clubbing, rash or edema . PICC  NEUROLOGIC:  Alert and oriented x 3.    No aphasia.  No dysarthria.  Affect pleasant.     Telemetry  SR 70-80s   Labs   Basic Metabolic Panel: Recent Labs  Lab 07/31/23 0543 08/01/23 1191 08/02/23 0655 08/03/23 0650 08/04/23 0904  NA 137 134* 130* 134* 134*  K 3.8 4.1 4.3 4.3 4.0  CL 101 99 96* 98 99  CO2 25 25 23 25 25   GLUCOSE 158* 164* 232* 123* 170*  BUN 24* 28* 26* 24* 27*  CREATININE 0.96 1.46* 1.14 1.04 1.17  CALCIUM 9.2 8.6* 8.7* 9.3 9.0  MG 1.9 2.1 1.9 1.9 1.8    Liver Function Tests: No results for input(s): "AST", "ALT", "ALKPHOS", "BILITOT", "PROT", "ALBUMIN" in the last 168 hours.  CBC: Recent Labs  Lab 07/31/23 0543 08/01/23 0613 08/02/23 0655 08/03/23 0650 08/04/23 0904  WBC 6.7 6.8 7.2 7.3 7.5  HGB 11.7* 9.7* 10.6* 10.6* 10.2*  HCT 36.3* 29.7* 33.1* 33.4* 31.6*  MCV 87.1 86.6 85.5 84.8 83.8  PLT 212 176 229 246 228    INR: Recent Labs  Lab 07/31/23 0543 08/01/23 0613 08/02/23 0655 08/03/23 0650 08/04/23 0904  INR 2.1* 2.6* 2.1* 2.1* 2.2*    Other results:    Imaging   No results found.   Medications:     Scheduled Medications:  allopurinol  100 mg Oral Daily   amLODipine  10 mg Oral Daily   clonazepam  0.5 mg Oral BID   fluticasone furoate-vilanterol  1 puff Inhalation Daily   And   umeclidinium bromide  1 puff Inhalation Daily   gabapentin  300 mg Oral BID    insulin aspart  0-20 Units Subcutaneous TID WC   insulin aspart  0-5 Units Subcutaneous QHS   insulin glargine-yfgn  14 Units Subcutaneous Daily   losartan  12.5 mg Oral Daily   metoprolol succinate  25 mg Oral Daily   minocycline  200 mg Oral BID   mirtazapine  15 mg Oral QHS   pantoprazole  40 mg Oral Daily   sertraline  50 mg Oral Daily   tamsulosin  0.4 mg Oral Daily   thiamine  100 mg Oral Daily   traZODone  150 mg Oral QHS   warfarin  4 mg Oral Once per day on Tuesday Thursday   warfarin  6 mg Oral Once per day on Sunday Monday Wednesday Friday Saturday   Warfarin - Pharmacist Dosing Inpatient   Does not apply q1600   zinc sulfate  220 mg Oral Daily    Infusions:  ceFEPime (MAXIPIME) IV 2 g (08/05/23 0536)   DAPTOmycin (CUBICIN) 800 mg in sodium chloride 0.9 % IVPB Stopped (08/04/23 1448)    PRN Medications: acetaminophen, fluticasone, oxyCODONE, traMADol   Patient Profile  62 y.o. 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 Now s/p Heartmate 3 LVAD. Now admitted w/ suspected DL fracture after multiple falls and recurrent DL infection.  Assessment/Plan:   1.  LVAD Driveline infection: MRSA driveline infection, admitted in 6/23 and again in 7/23-8/23 with multiple trips to the OR for debridement.  Cultures have grown acinetobacter and MRSA. He was readmitted with driveline infection in 2/24, completed course of daptomycin for MRSA. Now readmitted w/ suspected DL fracture and DL infection. P/w "driveline communication fault" error message on LVAD.LVAD coordinator replaced modular cable and controller with resolution of driveline communication fault signal. CT C/A/P c/w prior imaging w/ similar appearance of mild circumferential thickening of the fat plane surrounding the drive line in  the upper abdomen extending from the pump to the skin which may be chronic or represent an infectious process. No drainable fluid collection.  -Initial wound Cx + for Pseudomonas Aeruginosa (pan-sensitive) and S. aureus. Now on Dapto, Cefepime and minocycline. BCx NGTD. Taken to OR 7/31 for wound debridement + application of wound vac. Returned to OR 8/7 for wound wash out/debridement - Continue abx per ID. Appreciate their assistance. ID recommends 4 weeks of IV antibiotics. He is willing to stay in the hospital and complete antibiotics on 08/14/2023. - Pain well controlled improved with oxycodone and tramadol.  - Will need pain management once discharged.  2. Suspected DL fracture with multiple recent falls - Has had multiple falls during the last 2 months (prior  to admit), one direct trauma to mid sternum (fell on cinder block) - CT chest/abd/pelvis per above  - CXR w/ LVAD partially imaged. The associated lead noted to be intact w/ no fracture or kink  - LVAD coordinators changed modular cable and controller with resolution  - No alarms   3.  Chronic systolic CHF s/p LVAD: Echo 10/21 with EF 20-25%, mildly decreased RV function. LHC/RHC in 12/21 with patent grafts, low output. Suspect mixed ischemic/nonischemic cardiomyopathy (prior heavy ETOH and drugs as well as CAD). Admitted with cardiogenic shock in 12/21, had placement of Impella 5.5 initially, now s/p Heartmate 3 LVAD on 11/17/20.  Ramp echo 2/23 with speed decreased to 5500 rpm. Speed now back to 5600 rpm.  LVAD parameters stable.  - Volume status stable.  - Continue current Toprol XL 25 mg daily.  - Did not tolerate Entresto in the past d/t AKI and low MAPs.  - MAPs better controlled. Continue losartan 12.5 mg daily.   - INR/BMET/LDH stable.    4. CKD stage 3: Prior CTA abdomen/pelvis with high-grade stenosis of proximal right renal artery.  BMET pending.   5.  CAD: S/p CABG 12/20.  LHC pre-VAD with patent grafts, no target for  intervention. - Continue Crestor 20 mg daily.   6.  Atrial fibrillation: Paroxysmal.  S/p Maze and LA appendage clip with CABG in 12/20.  Now in NSR.  - Continue Toprol XL 25 mg daily.  - Off amiodarone with hyperthyroidism.  7. Type 2 diabetes: improved control, Hgb A1c down from 12.1>>6.3  - continue SSI   8. 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 He says last use of methamphetamine was > 2 months ago. - UDS this admit negative (however collected > 48 hrs post admit)  - Declines substance use resources  9. 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  Followed by endocrinology. He is now off amiodarone. He had been on methimazole but has not been taking recently. TSH was high on admit and free T4 normal  - Will NOT restart methimazole   10. Anemia (chronic blood loss):  CBC stable.    11. COPD: Prior smoker.  - Continue Trelegy.   12. PVCs/NSVT:  - Now off amiodarone.   13.  Depression: Has turned to substance abuse to cope.   - Has been referred to OP psych in [place]   14.  Gout: Continue allopurinol, he has prn colchicine.   15.  HTN:  - Maps better controlled. Continue 12.5 mg losartan and Amlodipine 10 mg.   16. Agitation: Klonopin increased, agitation improved  17. Incisional hernia: Large abdominal wall hernia in close proximity to driveline. Cannot tolerate wearing abdominal binder.  Has chronic discomfort from the hernia and the driveline.  It is not incarcerated.  - Continue oxycodone at low dose. Alternating with Tramadol. - No good option for hernia repair given driveline proximity.    Labs pending.   I reviewed the LVAD parameters from today, and compared the results to the patient's prior recorded data.  No programming changes were made.  The LVAD is functioning within specified parameters.  The patient performs LVAD self-test daily.  LVAD interrogation was negative for any significant power changes, alarms or PI events/speed  drops.  LVAD equipment check completed and is in good working order.  Back-up equipment present.   LVAD education done on emergency procedures and precautions and reviewed exit site care.  Length of Stay: 58  Kevin Clegg, NP 08/05/2023, 7:12 AM  VAD Team --- VAD ISSUES ONLY--- Pager [phone] (7am - 7am)  Advanced Heart Failure Team  Pager [phone] (M-F; 7a - 5p)  Please contact [facility] for night-coverage after hours (5p -7a ) and   Agree with the above NP note.   No complaints today.  MAP improved on losartan,80s-90s.   General: Well appearing this am. NAD.  HEENT: Normal. Neck: Supple, JVP 7-8 cm. Carotids OK.  Cardiac:  Mechanical heart sounds with LVAD hum present.  Lungs:  CTAB, normal effort.  Abdomen:  NT, ND, no HSM. No bruits or masses. +BS. Large ventral hernia.   LVAD exit site: Driveline dressed Extremities:  Warm and dry. No cyanosis, clubbing, rash, or edema.  Neuro:  Alert & oriented x 3. Cranial nerves grossly intact. Moves all 4 extremities w/o difficulty. Affect pleasant    He will remain in hospital to complete IV abx on 9/5. MAP controlled with addition of losartan.  LVAD parameters stable.   Kevin Underwood 08/05/2023 10:15 AM

## 2023-08-05 NOTE — Progress Notes (Signed)
ANTICOAGULATION CONSULT NOTE  Pharmacy Consult for warfarin  Indication:  LVAD  Allergies  Allergen Reactions   Vancomycin Nausea And Vomiting and Other (See Comments)    Severe joint pain    Patient Measurements: Height: 5\' 10"  (177.8 cm) Weight: 94.7 kg (208 lb 12.8 oz) IBW/kg (Calculated) : 73 Heparin Dosing Weight: 91.4 kg   Vital Signs: Temp: 97.6 F (36.4 C) (08/27 0724) Temp Source: Axillary (08/27 0724) BP: 113/81 (08/27 0724) Pulse Rate: 81 (08/27 0853)  Labs: Recent Labs    08/03/23 0650 08/04/23 0904 08/05/23 0803  HGB 10.6* 10.2* 9.9*  HCT 33.4* 31.6* 30.7*  PLT 246 228 255  LABPROT 24.0* 24.3* 23.7*  INR 2.1* 2.2* 2.1*  CREATININE 1.04 1.17 1.19  CKTOTAL  --  48*  --     Estimated Creatinine Clearance: 74.4 mL/min (by C-G formula based on SCr of 1.19 mg/dL).   Medical History: Past Medical History:  Diagnosis Date   "    Arthritis    CAD (coronary artery disease)    a. s/p CABG in 11/2019 with LIMA-LAD, SVG-OM1, SVG-PDA and SVG-D1   CHF (congestive heart failure) (HCC)    a. EF < 20% by echo in 11/2019   Essential hypertension    PAF (paroxysmal atrial fibrillation) (HCC)    Type 2 diabetes mellitus (HCC)     Medications:  Scheduled:   allopurinol  100 mg Oral Daily   amLODipine  10 mg Oral Daily   clonazepam  0.5 mg Oral BID   fluticasone furoate-vilanterol  1 puff Inhalation Daily   And   umeclidinium bromide  1 puff Inhalation Daily   gabapentin  300 mg Oral BID   insulin aspart  0-20 Units Subcutaneous TID WC   insulin aspart  0-5 Units Subcutaneous QHS   insulin glargine-yfgn  14 Units Subcutaneous Daily   losartan  12.5 mg Oral Daily   metoprolol succinate  25 mg Oral Daily   minocycline  200 mg Oral BID   mirtazapine  15 mg Oral QHS   pantoprazole  40 mg Oral Daily   sertraline  50 mg Oral Daily   tamsulosin  0.4 mg Oral Daily   thiamine  100 mg Oral Daily   traZODone  150 mg Oral QHS   warfarin  4 mg Oral Once per day  on Tuesday Thursday   warfarin  6 mg Oral Once per day on Sunday Monday Wednesday Friday Saturday   Warfarin - Pharmacist Dosing Inpatient   Does not apply q1600   zinc sulfate  220 mg Oral Daily    Assessment: 62 yom who presented to clinic with concern for driveline fracture and infection. [facility] appointment was on 04/21/2023 - INR was subtherapeutic at 1.4 and regimen at that time was 6 mg daily except 4 mg Tues/Thurs (LD 7/25). Given low INR on admit, started fixed rate heparin 500 units/hr - now stopped.   INR is therapeutic at 2.1, CBC and LDH stable. Prior home regimen of warfarin resumed.  Goal of Therapy:  INR 2-2.5 Monitor platelets by anticoagulation protocol: Yes   Plan:  Continue prior home dose of warfarin - 6mg  daily except 4mg  on Tues and Thurs Monitor daily INR, CBC, LDH, and for s/sx of bleeding   [name], PharmD, BCPS, [facility] Clinical Pharmacist [phone] Please check AMION for all [facility] Pharmacy numbers 08/05/2023

## 2023-08-06 DIAGNOSIS — T827XXA Infection and inflammatory reaction due to other cardiac and vascular devices, implants and grafts, initial encounter: Secondary | ICD-10-CM | POA: Diagnosis not present

## 2023-08-06 LAB — BASIC METABOLIC PANEL
Anion gap: 16 — ABNORMAL HIGH (ref 5–15)
BUN: 30 mg/dL — ABNORMAL HIGH (ref 8–23)
CO2: 20 mmol/L — ABNORMAL LOW (ref 22–32)
Calcium: 9.2 mg/dL (ref 8.9–10.3)
Chloride: 101 mmol/L (ref 98–111)
Creatinine, Ser: 1.38 mg/dL — ABNORMAL HIGH (ref 0.61–1.24)
GFR, Estimated: 58 mL/min — ABNORMAL LOW (ref >60)
Glucose, Bld: 164 mg/dL — ABNORMAL HIGH (ref 70–99)
Potassium: 4.1 mmol/L (ref 3.5–5.1)
Sodium: 137 mmol/L (ref 135–145)

## 2023-08-06 LAB — CBC
HCT: 33.4 % — ABNORMAL LOW (ref 39.0–52.0)
Hemoglobin: 10.6 g/dL — ABNORMAL LOW (ref 13.0–17.0)
MCH: 26.8 pg (ref 26.0–34.0)
MCHC: 31.7 g/dL (ref 30.0–36.0)
MCV: 84.3 fL (ref 80.0–100.0)
Platelets: 269 10*3/uL (ref 150–400)
RBC: 3.96 MIL/uL — ABNORMAL LOW (ref 4.22–5.81)
RDW: 13.8 % (ref 11.5–15.5)
WBC: 6 10*3/uL (ref 4.0–10.5)
nRBC: 0 % (ref 0.0–0.2)

## 2023-08-06 LAB — GLUCOSE, CAPILLARY
Glucose-Capillary: 142 mg/dL — ABNORMAL HIGH (ref 70–99)
Glucose-Capillary: 120 mg/dL — ABNORMAL HIGH (ref 70–99)
Glucose-Capillary: 83 mg/dL (ref 70–99)
Glucose-Capillary: 117 mg/dL — ABNORMAL HIGH (ref 70–99)

## 2023-08-06 LAB — PROTIME-INR
INR: 2.1 — ABNORMAL HIGH (ref 0.8–1.2)
Prothrombin Time: 23.7 s — ABNORMAL HIGH (ref 11.4–15.2)

## 2023-08-06 LAB — MAGNESIUM: Magnesium: 1.9 mg/dL (ref 1.7–2.4)

## 2023-08-06 LAB — LACTATE DEHYDROGENASE: LDH: 185 U/L (ref 98–192)

## 2023-08-06 MED ORDER — MAGNESIUM SULFATE 2 GM/50ML IV SOLN
2.0000 g | Freq: Once | INTRAVENOUS | Status: AC
  Administered 2023-08-06: 2 g via INTRAVENOUS
  Filled 2023-08-06: qty 50

## 2023-08-06 NOTE — TOC Progression Note (Signed)
Transition of Care (TOC) - Progression Note    Patient Details  Name: Kevin Underwood. MRN: [id number] Date of Birth: Nov 30, 1961  Transition of Care [facility]) CM/SW Contact  Nicanor Bake Phone Number: [phone] 08/06/2023, 11:27 AM  Clinical Narrative:   [facility] CSW met with pt at bedside. Pt stated that he was feeling much better today. Pt stated that he is interested in [facility] services once dc. Pt stated that he thinks it will be good for him and his wife needs the additional support. Pt stated that he is willing to comply because he wants to live and be healthy for as long as he can. CSW stated that she will follow up with wife to see what agency they are considering. TOC will continue following.     Expected Discharge Plan: Home w Home Health Services Barriers to Discharge: Continued Medical Work up  Expected Discharge Plan and Services   Discharge Planning Services: CM Consult Post Acute Care Choice: Home Health Living arrangements for the past 2 months: Single Family Home                           HH Arranged: RN, PT           Social Determinants of Health (SDOH) Interventions SDOH Screenings   Food Insecurity: No Food Insecurity (07/08/2023)  Housing: Low Risk  (07/08/2023)  Transportation Needs: No Transportation Needs (07/08/2023)  Utilities: Not At Risk (07/08/2023)  Alcohol Screen: Low Risk  (11/10/2020)  Depression (PHQ2-9): Low Risk  (10/16/2022)  Financial Resource Strain: Medium Risk (05/17/2022)  Physical Activity: Inactive (11/10/2020)  Tobacco Use: Medium Risk (07/09/2023)    Readmission Risk Interventions    07/01/2022    9:05 AM 11/28/2020   11:57 AM  Readmission Risk Prevention Plan  Transportation Screening Complete Complete  Medication Review Oceanographer) Complete Complete  PCP or Specialist appointment within 3-5 days of discharge Complete   HRI or Home Care Consult Complete Complete  SW Recovery Care/Counseling Consult Complete  Complete  Palliative Care Screening Not Applicable Not Applicable  Skilled Nursing Facility Not Applicable Not Applicable

## 2023-08-06 NOTE — Progress Notes (Addendum)
Patient ID: Kevin Rigg., male   DOB: Nov 23, 1961, 62 y.o.   MRN: [id number]   Advanced Heart Failure VAD Team Note  PCP-Cardiologist: Nona Dell, MD  [facility]: Dr. Shirlee Latch   Subjective:    7/29: admitted w/ suspected DL fracture and DL infection. P/w "driveline communication fault" error message on LVAD. No pump stoppages. LVAD coordinator replaced modular cable and controller with resolution of driveline communication fault signal. CT C/A/P c/w prior imaging w/ mild circumferential thickening of the fat plane surrounding the drive line in the upper abdomen extending from the pump to the skin which may be chronic or represent an infectious process. No drainable fluid collection. Wound + BCx collected. Started on empiric per ID.  7/30: Wound Cx growing Pseudomonas Aeruginosa (pan-sensitive) and S. aureus.  7/31: OR for wound debridement + application of wound vac.   8/8: Returned to OR for I&D  Remains on Daptomycin, Cefepime and minocycline. Surgical wound with pseudomonas aeruginosa and MRSA. Bcx from 7/29 and 7/31 NG.   AM labs pending.  Feeling well. Pain under good control with current regimen.   LVAD INTERROGATION:  HeartMate III LVAD:   Flow 4.7 liters/min, speed 5600 power 4 PI  2.8. 12 PI events so far this am  Objective:    Vital Signs:   Temp:  [97.6 F (36.4 C)-98.3 F (36.8 C)] 98.3 F (36.8 C) (08/28 0616) Pulse Rate:  [74-84] 74 (08/27 1556) Resp:  [12-17] 12 (08/28 0616) BP: (86-118)/(55-104) 118/104 (08/28 0616) SpO2:  [94 %-97 %] 94 % (08/28 0616) Weight:  [95.6 kg] 95.6 kg (08/28 0616) Last BM Date : 08/03/23 Mean arterial Pressure 80s-90s Intake/Output:   Intake/Output Summary (Last 24 hours) at 08/06/2023 0704 Last data filed at 08/05/2023 1000 Gross per 24 hour  Intake 240 ml  Output --  Net 240 ml    Physical Exam   Physical Exam: GENERAL: Well appearing. Sitting up in bed. HEENT: normal  NECK: Supple, JVP not elevated.  2+ bilaterally, no  bruits.   CARDIAC:  Mechanical heart sounds with LVAD hum present.  LUNGS:  Clear to auscultation bilaterally.  ABDOMEN:  Soft, round, nontender, positive bowel sounds x4.    Large abdominal hernia. Wearing binder. LVAD exit site: Scant amount of drainage on dressing. Stabilization device present and accurately applied.   EXTREMITIES:  Warm and dry, no cyanosis, clubbing, rash or edema  NEUROLOGIC:  Alert and oriented x 4. Affect pleasant.      Telemetry  SR 70s-80s  Labs   Basic Metabolic Panel: Recent Labs  Lab 08/01/23 [phone] 08/02/23 0655 08/03/23 0650 08/04/23 0904 08/05/23 0803  NA 134* 130* 134* 134* 135  K 4.1 4.3 4.3 4.0 4.1  CL 99 96* 98 99 100  CO2 25 23 25 25 24   GLUCOSE 164* 232* 123* 170* 89  BUN 28* 26* 24* 27* 30*  CREATININE 1.46* 1.14 1.04 1.17 1.19  CALCIUM 8.6* 8.7* 9.3 9.0 9.1  MG 2.1 1.9 1.9 1.8 2.0    Liver Function Tests: No results for input(s): "AST", "ALT", "ALKPHOS", "BILITOT", "PROT", "ALBUMIN" in the last 168 hours.  CBC: Recent Labs  Lab 08/01/23 [phone] 08/02/23 0655 08/03/23 0650 08/04/23 0904 08/05/23 0803  WBC 6.8 7.2 7.3 7.5 6.9  HGB 9.7* 10.6* 10.6* 10.2* 9.9*  HCT 29.7* 33.1* 33.4* 31.6* 30.7*  MCV 86.6 85.5 84.8 83.8 83.9  PLT 176 229 246 228 255    INR: Recent Labs  Lab 08/01/23 0613 08/02/23 0655 08/03/23 0650 08/04/23  [id number] 08/05/23 0803  INR 2.6* 2.1* 2.1* 2.2* 2.1*    Other results:    Imaging   No results found.   Medications:     Scheduled Medications:  allopurinol  100 mg Oral Daily   amLODipine  10 mg Oral Daily   clonazepam  0.5 mg Oral BID   fluticasone furoate-vilanterol  1 puff Inhalation Daily   And   umeclidinium bromide  1 puff Inhalation Daily   gabapentin  300 mg Oral BID   insulin aspart  0-20 Units Subcutaneous TID WC   insulin aspart  0-5 Units Subcutaneous QHS   insulin glargine-yfgn  14 Units Subcutaneous Daily   losartan  12.5 mg Oral Daily   metoprolol succinate  25 mg Oral  Daily   minocycline  200 mg Oral BID   mirtazapine  15 mg Oral QHS   pantoprazole  40 mg Oral Daily   sertraline  50 mg Oral Daily   tamsulosin  0.4 mg Oral Daily   thiamine  100 mg Oral Daily   traZODone  150 mg Oral QHS   warfarin  4 mg Oral Once per day on Tuesday Thursday   warfarin  6 mg Oral Once per day on Sunday Monday Wednesday Friday Saturday   Warfarin - Pharmacist Dosing Inpatient   Does not apply q1600   zinc sulfate  220 mg Oral Daily    Infusions:  ceFEPime (MAXIPIME) IV Stopped (08/06/23 0616)   DAPTOmycin (CUBICIN) 800 mg in sodium chloride 0.9 % IVPB 800 mg (08/05/23 1436)    PRN Medications: acetaminophen, fluticasone, oxyCODONE, traMADol   Patient Profile  62 y.o. 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 Now s/p Heartmate 3 LVAD. Now admitted w/ suspected DL fracture after multiple falls and recurrent DL infection.  Assessment/Plan:   1.  LVAD Driveline infection: MRSA driveline infection, admitted in 6/23 and again in 7/23-8/23 with multiple trips to the OR for debridement.  Cultures have grown acinetobacter and MRSA. He was readmitted with driveline infection in 2/24, completed course of daptomycin for MRSA. Now readmitted w/ suspected DL fracture and DL infection. P/w "driveline communication fault" error message on LVAD.LVAD coordinator replaced modular cable and controller with resolution of driveline communication fault signal. CT C/A/P c/w prior imaging w/ similar appearance of mild circumferential thickening of the fat plane surrounding the drive line in the upper abdomen extending from the pump to the skin which may be chronic or represent an infectious process. No drainable fluid collection.  -Initial wound Cx + for Pseudomonas Aeruginosa (pan-sensitive) and S.  aureus. Now on Dapto, Cefepime and minocycline. BC x 2 NG. Taken to OR 7/31 for wound debridement + application of wound vac. Returned to OR 8/7 for wound wash out/debridement - Continue abx per ID. Appreciate their assistance. ID recommends 4 weeks of IV antibiotics. He is willing to stay in the hospital and complete IV antibiotics on 08/14/2023. - Pain well controlled, improved with oxycodone and tramadol.  - Will need pain management once discharged.  2. Suspected DL fracture with multiple recent falls - Has had multiple falls during the last 2 months (prior to admit), one direct trauma to mid sternum (fell on cinder block) - CT chest/abd/pelvis per above  - CXR w/ LVAD partially imaged. The associated lead noted to be intact w/  no fracture or kink  - LVAD coordinators changed modular cable and controller with resolution  - No alarms   3.  Chronic systolic CHF s/p LVAD: Echo 10/21 with EF 20-25%, mildly decreased RV function. LHC/RHC in 12/21 with patent grafts, low output. Suspect mixed ischemic/nonischemic cardiomyopathy (prior heavy ETOH and drugs as well as CAD). Admitted with cardiogenic shock in 12/21, had placement of Impella 5.5 initially, now s/p Heartmate 3 LVAD on 11/17/20.  Ramp echo 2/23 with speed decreased to 5500 rpm. Speed now back to 5600 rpm.  LVAD parameters stable.  - Volume status stable.  - Continue current Toprol XL 25 mg daily.  - Did not tolerate Entresto in the past d/t AKI and low MAPs.  - MAPs better controlled. Continue losartan 12.5 mg daily.   - INR/BMET/LDH have been stable.  Awaiting AM labs  4. CKD stage 3: Prior CTA abdomen/pelvis with high-grade stenosis of proximal right renal artery.  BMET pending.  5.  CAD: S/p CABG 12/20.  LHC pre-VAD with patent grafts, no target for intervention. - Continue Crestor 20 mg daily.   6.  Atrial fibrillation: Paroxysmal.  S/p Maze and LA appendage clip with CABG in 12/20.  Now in NSR.  - Continue Toprol XL 25 mg daily.   - Off amiodarone with hyperthyroidism.  7. Type 2 diabetes: improved control, Hgb A1c down from 12.1>>6.3  - continue SSI   8. 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 He says last use of methamphetamine was > 2 months ago. - UDS this admit negative (however collected > 48 hrs post admit)  - Declines substance use resources  9. 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  Followed by endocrinology. He is now off amiodarone. He had been on methimazole but has not been taking recently. TSH was high on admit and free T4 normal  - Will NOT restart methimazole   10. Anemia (chronic blood loss):  CBC has been stable.  AM labs pending.  11. COPD: Prior smoker.  - Continue Trelegy.   12. PVCs/NSVT:  - Now off amiodarone.   13.  Depression: Has turned to substance abuse to cope.   - Has been referred to OP psych in    14.  Gout: Continue allopurinol, he has prn colchicine.   15.  HTN:  - Maps better controlled. Continue 12.5 mg losartan and Amlodipine 10 mg.   16. Agitation: Klonopin increased, agitation improved  17. Incisional hernia: Large abdominal wall hernia in close proximity to driveline. Cannot tolerate wearing abdominal binder.  Has chronic discomfort from the hernia and the driveline.  It is not incarcerated.  - Continue oxycodone. Alternating with Tramadol. - No good option for hernia repair given driveline proximity.    AM labs pending  I reviewed the LVAD parameters from today, and compared the results to the patient's prior recorded data.  No programming changes were made.  The LVAD is functioning within specified parameters.  The patient performs LVAD self-test daily.  LVAD interrogation was negative for any significant power changes, alarms or PI events/speed drops.  LVAD equipment check completed and is in good working order.  Back-up equipment present.   LVAD education done on emergency procedures and precautions and reviewed exit  site care.  Length of Stay: [address] Kevin Garnet, PA-C 08/06/2023, 7:04 AM  VAD Team --- VAD ISSUES ONLY--- Pager [phone] (7am - 7am)  Advanced Heart Failure Team  Pager [phone] (M-F; 7a - 5p)  Please contact [facility] for night-coverage after hours (5p -7a ) and   Agree with the above NP note.    No complaints today.  MAP still elevated 90s-100s.    General: Well appearing this am. NAD.  HEENT: Normal. Neck: Supple, JVP 7-8 cm. Carotids OK.  Cardiac:  Mechanical heart sounds with LVAD hum present.  Lungs:  CTAB, normal effort.  Abdomen:  NT, ND, no HSM. No bruits or masses. +BS. Large ventral hernia.  LVAD exit site: Site dressed.  Extremities:  Warm and dry. No cyanosis, clubbing, rash, or edema.  Neuro:  Alert & oriented x 3. Cranial nerves grossly intact. Moves all 4 extremities w/o difficulty. Affect pleasant    MAP still elevated, will add spironolactone 12.5 daily.    He will remain in hospital to complete IV abx on 9/5.   LVAD parameters stable.  Kevin Underwood 08/06/2023 10:59 AM

## 2023-08-06 NOTE — Plan of Care (Signed)
  Problem: Education: Goal: Patient will understand all VAD equipment and how it functions Outcome: Progressing Goal: Patient will be able to verbalize current INR target range and antiplatelet therapy for discharge home Outcome: Progressing   Problem: Cardiac: Goal: LVAD will function as expected and patient will experience no clinical alarms Outcome: Progressing   Problem: Education: Goal: Knowledge of General Education information will improve Description: Including pain rating scale, medication(s)/side effects and non-pharmacologic comfort measures Outcome: Progressing   Problem: Health Behavior/Discharge Planning: Goal: Ability to manage health-related needs will improve Outcome: Progressing   Problem: Clinical Measurements: Goal: Ability to maintain clinical measurements within normal limits will improve Outcome: Progressing Goal: Will remain free from infection Outcome: Progressing Goal: Diagnostic test results will improve Outcome: Progressing Goal: Respiratory complications will improve Outcome: Progressing Goal: Cardiovascular complication will be avoided Outcome: Progressing   Problem: Activity: Goal: Risk for activity intolerance will decrease Outcome: Progressing   Problem: Nutrition: Goal: Adequate nutrition will be maintained Outcome: Progressing   Problem: Coping: Goal: Level of anxiety will decrease Outcome: Progressing   Problem: Elimination: Goal: Will not experience complications related to bowel motility Outcome: Progressing Goal: Will not experience complications related to urinary retention Outcome: Progressing   Problem: Pain Managment: Goal: General experience of comfort will improve Outcome: Progressing   Problem: Safety: Goal: Ability to remain free from injury will improve Outcome: Progressing   Problem: Skin Integrity: Goal: Risk for impaired skin integrity will decrease Outcome: Progressing   Problem: Education: Goal: Ability  to describe self-care measures that may prevent or decrease complications (Diabetes Survival Skills Education) will improve Outcome: Progressing Goal: Individualized Educational Video(s) Outcome: Progressing   Problem: Coping: Goal: Ability to adjust to condition or change in health will improve Outcome: Progressing   Problem: Fluid Volume: Goal: Ability to maintain a balanced intake and output will improve Outcome: Progressing   Problem: Health Behavior/Discharge Planning: Goal: Ability to identify and utilize available resources and services will improve Outcome: Progressing Goal: Ability to manage health-related needs will improve Outcome: Progressing   Problem: Metabolic: Goal: Ability to maintain appropriate glucose levels will improve Outcome: Progressing   Problem: Nutritional: Goal: Maintenance of adequate nutrition will improve Outcome: Progressing Goal: Progress toward achieving an optimal weight will improve Outcome: Progressing   Problem: Skin Integrity: Goal: Risk for impaired skin integrity will decrease Outcome: Progressing   Problem: Tissue Perfusion: Goal: Adequacy of tissue perfusion will improve Outcome: Progressing   

## 2023-08-06 NOTE — Progress Notes (Signed)
ANTICOAGULATION CONSULT NOTE  Pharmacy Consult for warfarin  Indication:  LVAD  Allergies  Allergen Reactions   Vancomycin Nausea And Vomiting and Other (See Comments)    Severe joint pain    Patient Measurements: Height: 5\' 10"  (177.8 cm) Weight: 95.6 kg (210 lb 12.2 oz) IBW/kg (Calculated) : 73 Heparin Dosing Weight: 91.4 kg   Vital Signs: Temp: 98.3 F (36.8 C) (08/28 0734) Temp Source: Oral (08/28 0734) BP: 107/82 (08/28 0734) Pulse Rate: 76 (08/28 0734)  Labs: Recent Labs    08/04/23 0904 08/05/23 0803 08/06/23 0706  HGB 10.2* 9.9* 10.6*  HCT 31.6* 30.7* 33.4*  PLT 228 255 269  LABPROT 24.3* 23.7* 23.7*  INR 2.2* 2.1* 2.1*  CREATININE 1.17 1.19 1.38*  CKTOTAL 48*  --   --     Estimated Creatinine Clearance: 64.4 mL/min (A) (by C-G formula based on SCr of 1.38 mg/dL (H)).   Medical History: Past Medical History:  Diagnosis Date   "    Arthritis    CAD (coronary artery disease)    a. s/p CABG in 11/2019 with LIMA-LAD, SVG-OM1, SVG-PDA and SVG-D1   CHF (congestive heart failure) (HCC)    a. EF < 20% by echo in 11/2019   Essential hypertension    PAF (paroxysmal atrial fibrillation) (HCC)    Type 2 diabetes mellitus (HCC)     Medications:  Scheduled:   allopurinol  100 mg Oral Daily   amLODipine  10 mg Oral Daily   clonazepam  0.5 mg Oral BID   fluticasone furoate-vilanterol  1 puff Inhalation Daily   And   umeclidinium bromide  1 puff Inhalation Daily   gabapentin  300 mg Oral BID   insulin aspart  0-20 Units Subcutaneous TID WC   insulin aspart  0-5 Units Subcutaneous QHS   insulin glargine-yfgn  14 Units Subcutaneous Daily   losartan  12.5 mg Oral Daily   metoprolol succinate  25 mg Oral Daily   minocycline  200 mg Oral BID   mirtazapine  15 mg Oral QHS   pantoprazole  40 mg Oral Daily   sertraline  50 mg Oral Daily   tamsulosin  0.4 mg Oral Daily   thiamine  100 mg Oral Daily   traZODone  150 mg Oral QHS   warfarin  4 mg Oral Once per  day on Tuesday Thursday   warfarin  6 mg Oral Once per day on Sunday Monday Wednesday Friday Saturday   Warfarin - Pharmacist Dosing Inpatient   Does not apply q1600   zinc sulfate  220 mg Oral Daily    Assessment: 62 yom who presented to clinic with concern for driveline fracture and infection. Last [facility] appointment was on 04/21/2023 - INR was subtherapeutic at 1.4 and regimen at that time was 6 mg daily except 4 mg Tues/Thurs (LD 7/25). Given low INR on admit, started fixed rate heparin 500 units/hr - now stopped.   INR remains therapeutic at 2.1, CBC and LDH stable. Prior home regimen of warfarin resumed.  Goal of Therapy:  INR 2-2.5 Monitor platelets by anticoagulation protocol: Yes   Plan:  Continue prior home dose of warfarin - 6mg  daily except 4mg  on Tues and Thurs Monitor daily INR, CBC, LDH, and for s/sx of bleeding   [name], PharmD, BCPS, [facility] Clinical Pharmacist [phone] Please check AMION for all [facility] Pharmacy numbers 08/06/2023

## 2023-08-06 NOTE — Progress Notes (Signed)
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 Started on IV antibiotics.   Patient presented to VAD clinic 7/29 for driveline communication fault- stating that he has had "alarms every couple hrs from his VAD for a couple months." Pt has no showed and cancelled all appointments in the past 2 months. Pt also had green/blue drainage visible through his driveline bandage. (Dressing change and wound cx obtained). Pt states that he has experienced dizziness associated with a fall multiple times over the past couple months but no signs of bleeding. Controller and modular cable exchanged. No further communication fault alarms noted.   CT Chest/Abd/Pelvis 7/29 1. Similar appearance of mild circumferential thickening of the fat plane surrounding the drive line in the upper abdomen extending from the pump to the skin which may be chronic or represent an infectious process. No drainable fluid collection.  Pt had drive line debridement in OR with placement of instillation wound vac on 07/09/23 with Dr Donata Clay. This was change to wet/dry on Friday 8/2 due to ongoing alarms related to the pts hernia.   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 Currently on IV Cefepime Q 8 hrs and IV Daptomycin 800 mg daily per ID team. ID team is recommending 4 weeks IV antibiotics. Pt is refusing to go a facility for IV antibiotics.   Pt sitting up in recliner on my arrival. Denies complaints. States he is having a better day today. ID recommending 2 more weeks of IV antibiotics- end date 9/5.   Vital signs: Temp: 98.3 HR: 81 Doppler Pressure: 98 Automatic BP: 107/82 (91) O2 Sat: 95% on RA Wt: 193.3>194.8>196.4>194.7>...>211.4>209.2>206.8>208.4>211.6>208.3>208.8>210.7 lbs  LVAD interrogation reveals:  Speed: 5600 Flow: 4.5 Power: 4.2 w PI: 3.1 Alarms: none Events: on batteries Hct:  31  Fixed speed:  5600 Low speed limit:  5300  Drive Line: Existing VAD dressing removed and site care performed using sterile technique. Wound bed cleansed with VASHE solution and sterile 4x4 gauze through light manual debridement. Skin surrounding wound bed cleaned with Chlora prep applicators x 2, allowed to dry. VASHE moistened 2 x 2s  placed in wound bed, covered with several dry 4 x 4s. Wound tunnels approx 2 cm.  Drive line unincorporated. Small amount of thick yellow/serosanguinous drainage noted. No redness, tenderness, foul odor or rash noted.  Drive line anchor secured.   Continue daily wet to dry dressing changes using VASHE solution per bedside RN or VAD coordinator. Next dressing change due 08/07/23 by bedside nurse.      Labs:  LDH trend: 121>133>130>138>172>175>165>158>169>155>170>167>173>161>163>156>165>168>158>149>153>167>185  INR trend: 1.0>1.1>1.2>1.4>1.3>1.4>1.5>1.4>1.8>1.9>1.7>1.8>1.9>2.1>2.2>1.2>2.1>2.6>2.2>2.1>2.1  Anticoagulation Plan: -INR Goal: 2.0 - 2.5 -ASA Dose: none  ICD: N/A  Infection:  07/07/23>> blood cultures>> no growth 5 days; final  07/07/23>> wound cx>> few pseudomonas aeruginosa and MRSA; final  07/09/23>> OR driveline cx>> Few pseudomonas aeruginosa and METHICILLIN RESISTANT STAPHYLOCOCCUS AUREUS 07/09/23>> blood cultures>>no growth x 5 days; final  Drips:    Plan/Recommendations:  Page VAD coordinator with equipment issues or driveline problems Daily drive line wet to dry dressing changes with VASHE solution.   Alyce Pagan RN VAD Coordinator  Office: [phone]  24/7 Pager: [phone]

## 2023-08-07 DIAGNOSIS — T827XXA Infection and inflammatory reaction due to other cardiac and vascular devices, implants and grafts, initial encounter: Secondary | ICD-10-CM | POA: Diagnosis not present

## 2023-08-07 LAB — GLUCOSE, CAPILLARY
Glucose-Capillary: 114 mg/dL — ABNORMAL HIGH (ref 70–99)
Glucose-Capillary: 142 mg/dL — ABNORMAL HIGH (ref 70–99)
Glucose-Capillary: 176 mg/dL — ABNORMAL HIGH (ref 70–99)

## 2023-08-07 LAB — BASIC METABOLIC PANEL
Anion gap: 14 (ref 5–15)
BUN: 25 mg/dL — ABNORMAL HIGH (ref 8–23)
CO2: 20 mmol/L — ABNORMAL LOW (ref 22–32)
Calcium: 9.1 mg/dL (ref 8.9–10.3)
Chloride: 102 mmol/L (ref 98–111)
Creatinine, Ser: 1.26 mg/dL — ABNORMAL HIGH (ref 0.61–1.24)
GFR, Estimated: >60 mL/min (ref >60)
Glucose, Bld: 109 mg/dL — ABNORMAL HIGH (ref 70–99)
Potassium: 4 mmol/L (ref 3.5–5.1)
Sodium: 136 mmol/L (ref 135–145)

## 2023-08-07 LAB — CBC
HCT: 31.9 % — ABNORMAL LOW (ref 39.0–52.0)
Hemoglobin: 10.5 g/dL — ABNORMAL LOW (ref 13.0–17.0)
MCH: 27.1 pg (ref 26.0–34.0)
MCHC: 32.9 g/dL (ref 30.0–36.0)
MCV: 82.4 fL (ref 80.0–100.0)
Platelets: 268 10*3/uL (ref 150–400)
RBC: 3.87 MIL/uL — ABNORMAL LOW (ref 4.22–5.81)
RDW: 14 % (ref 11.5–15.5)
WBC: 6.8 10*3/uL (ref 4.0–10.5)
nRBC: 0 % (ref 0.0–0.2)

## 2023-08-07 LAB — PROTIME-INR
INR: 2 — ABNORMAL HIGH (ref 0.8–1.2)
Prothrombin Time: 23.2 s — ABNORMAL HIGH (ref 11.4–15.2)

## 2023-08-07 LAB — LACTATE DEHYDROGENASE: LDH: 205 U/L — ABNORMAL HIGH (ref 98–192)

## 2023-08-07 LAB — MAGNESIUM: Magnesium: 2.1 mg/dL (ref 1.7–2.4)

## 2023-08-07 MED ORDER — DOCUSATE SODIUM 100 MG PO CAPS
100.0000 mg | ORAL_CAPSULE | Freq: Two times a day (BID) | ORAL | Status: DC
  Administered 2023-08-07 – 2023-08-15 (×15): 100 mg via ORAL
  Filled 2023-08-07 (×16): qty 1

## 2023-08-07 MED ORDER — POLYETHYLENE GLYCOL 3350 17 G PO PACK
17.0000 g | PACK | Freq: Every day | ORAL | Status: DC
  Administered 2023-08-07 – 2023-08-15 (×9): 17 g via ORAL
  Filled 2023-08-07 (×9): qty 1

## 2023-08-07 MED ORDER — SODIUM CHLORIDE 0.9% FLUSH
10.0000 mL | Freq: Two times a day (BID) | INTRAVENOUS | Status: DC
  Administered 2023-08-07 – 2023-08-14 (×15): 10 mL

## 2023-08-07 MED ORDER — SODIUM CHLORIDE 0.9 % IV SOLN
2.0000 g | Freq: Three times a day (TID) | INTRAVENOUS | Status: AC
  Administered 2023-08-07 – 2023-08-14 (×21): 2 g via INTRAVENOUS
  Filled 2023-08-07 (×22): qty 12.5

## 2023-08-07 MED ORDER — SPIRONOLACTONE 12.5 MG HALF TABLET
12.5000 mg | ORAL_TABLET | Freq: Every day | ORAL | Status: DC
  Administered 2023-08-07 – 2023-08-15 (×9): 12.5 mg via ORAL
  Filled 2023-08-07 (×9): qty 1

## 2023-08-07 MED ORDER — SODIUM CHLORIDE 0.9% FLUSH: 10.0000 mL | INTRAVENOUS | Status: DC | PRN

## 2023-08-07 NOTE — Progress Notes (Signed)
ANTICOAGULATION CONSULT NOTE  Pharmacy Consult for warfarin  Indication:  LVAD  Allergies  Allergen Reactions   Vancomycin Nausea And Vomiting and Other (See Comments)    Severe joint pain    Patient Measurements: Height: 5\' 10"  (177.8 cm) Weight: 92.5 kg (203 lb 14.8 oz) IBW/kg (Calculated) : 73 Heparin Dosing Weight: 91.4 kg   Vital Signs: Temp: 97.7 F (36.5 C) (08/29 0742) Temp Source: Oral (08/29 0742) BP: 104/86 (08/29 0742) Pulse Rate: 72 (08/29 0600)  Labs: Recent Labs    08/04/23 0904 08/05/23 0803 08/06/23 0706 08/07/23 0655  HGB 10.2* 9.9* 10.6* 10.5*  HCT 31.6* 30.7* 33.4* 31.9*  PLT 228 255 269 268  LABPROT 24.3* 23.7* 23.7* 23.2*  INR 2.2* 2.1* 2.1* 2.0*  CREATININE 1.17 1.19 1.38* 1.26*  CKTOTAL 48*  --   --   --     Estimated Creatinine Clearance: 69.5 mL/min (A) (by C-G formula based on SCr of 1.26 mg/dL (H)).   Medical History: Past Medical History:  Diagnosis Date   "    Arthritis    CAD (coronary artery disease)    a. s/p CABG in 11/2019 with LIMA-LAD, SVG-OM1, SVG-PDA and SVG-D1   CHF (congestive heart failure) (HCC)    a. EF < 20% by echo in 11/2019   Essential hypertension    PAF (paroxysmal atrial fibrillation) (HCC)    Type 2 diabetes mellitus (HCC)     Medications:  Scheduled:   allopurinol  100 mg Oral Daily   amLODipine  10 mg Oral Daily   clonazepam  0.5 mg Oral BID   docusate sodium  100 mg Oral BID   fluticasone furoate-vilanterol  1 puff Inhalation Daily   And   umeclidinium bromide  1 puff Inhalation Daily   gabapentin  300 mg Oral BID   insulin aspart  0-20 Units Subcutaneous TID WC   insulin aspart  0-5 Units Subcutaneous QHS   insulin glargine-yfgn  14 Units Subcutaneous Daily   losartan  12.5 mg Oral Daily   metoprolol succinate  25 mg Oral Daily   minocycline  200 mg Oral BID   mirtazapine  15 mg Oral QHS   pantoprazole  40 mg Oral Daily   polyethylene glycol  17 g Oral Daily   sertraline  50 mg Oral  Daily   tamsulosin  0.4 mg Oral Daily   thiamine  100 mg Oral Daily   traZODone  150 mg Oral QHS   warfarin  4 mg Oral Once per day on Tuesday Thursday   warfarin  6 mg Oral Once per day on Sunday Monday Wednesday Friday Saturday   Warfarin - Pharmacist Dosing Inpatient   Does not apply q1600   zinc sulfate  220 mg Oral Daily    Assessment: 62 yom who presented to clinic with concern for driveline fracture and infection. Last [facility] appointment was on 04/21/2023 - INR was subtherapeutic at 1.4 and regimen at that time was 6 mg daily except 4 mg Tues/Thurs (LD 7/25). Given low INR on admit, started fixed rate heparin 500 units/hr - now stopped.   INR remains therapeutic at 2.0, CBC and LDH stable. Prior home regimen of warfarin resumed and INR is stable.  Goal of Therapy:  INR 2-2.5 Monitor platelets by anticoagulation protocol: Yes   Plan:  Continue prior home dose of warfarin - 6mg  daily except 4mg  on Tues and Thurs Monitor daily INR, CBC, LDH, and for s/sx of bleeding   [name], PharmD, BCPS,  [facility] Clinical Pharmacist [phone] Please check AMION for all [facility] Pharmacy numbers 08/07/2023

## 2023-08-07 NOTE — Plan of Care (Signed)
  Problem: Education: Goal: Patient will understand all VAD equipment and how it functions Outcome: Progressing   Problem: Cardiac: Goal: LVAD will function as expected and patient will experience no clinical alarms Outcome: Progressing   Problem: Education: Goal: Knowledge of General Education information will improve Description: Including pain rating scale, medication(s)/side effects and non-pharmacologic comfort measures Outcome: Progressing   Problem: Health Behavior/Discharge Planning: Goal: Ability to manage health-related needs will improve Outcome: Progressing   Problem: Clinical Measurements: Goal: Diagnostic test results will improve Outcome: Progressing   Problem: Activity: Goal: Risk for activity intolerance will decrease Outcome: Progressing   Problem: Nutrition: Goal: Adequate nutrition will be maintained Outcome: Progressing   Problem: Coping: Goal: Level of anxiety will decrease Outcome: Progressing   Problem: Pain Managment: Goal: General experience of comfort will improve Outcome: Progressing   Problem: Safety: Goal: Ability to remain free from injury will improve Outcome: Progressing   Problem: Skin Integrity: Goal: Risk for impaired skin integrity will decrease Outcome: Progressing

## 2023-08-07 NOTE — Progress Notes (Addendum)
Patient ID: Kevin Rigg., male   DOB: [birth year], 62 y.o.   MRN: [id number]   Advanced Heart Failure VAD Team Note  PCP-Cardiologist: Nona Dell, MD  [facility]: Dr. Shirlee Latch   Subjective:    7/29: admitted w/ suspected DL fracture and DL infection. P/w "driveline communication fault" error message on LVAD. No pump stoppages. LVAD coordinator replaced modular cable and controller with resolution of driveline communication fault signal. CT C/A/P c/w prior imaging w/ mild circumferential thickening of the fat plane surrounding the drive line in the upper abdomen extending from the pump to the skin which may be chronic or represent an infectious process. No drainable fluid collection. Wound + BCx collected. Started on empiric per ID.  7/30: Wound Cx growing Pseudomonas Aeruginosa (pan-sensitive) and S. aureus.  7/31: OR for wound debridement + application of wound vac.   8/8: Returned to OR for I&D  Remains on Daptomycin, Cefepime and minocycline. Surgical wound with pseudomonas aeruginosa and MRSA. Bcx from 7/29 and 7/31 NG.   PIV out last night. He is asking for PICC. Complaining of constipation.   LVAD INTERROGATION:  HeartMate III LVAD:   Flow 4.4 liters/min, speed 5600 power 4 PI  3.5  36 PI events.  Objective:    Vital Signs:   Temp:  [97.6 F (36.4 C)-98.3 F (36.8 C)] 97.6 F (36.4 C) (08/29 0600) Pulse Rate:  [72-76] 72 (08/29 0600) Resp:  [10-21] 10 (08/29 0600) BP: (104-116)/(82-98) 107/89 (08/29 0600) SpO2:  [95 %-98 %] 98 % (08/29 0600) Weight:  [92.5 kg] 92.5 kg (08/29 0600) Last BM Date : 08/03/23 Mean arterial Pressure 90-100s Intake/Output:   Intake/Output Summary (Last 24 hours) at 08/07/2023 0711 Last data filed at 08/06/2023 1500 Gross per 24 hour  Intake 650.8 ml  Output --  Net 650.8 ml    Physical Exam   Physical Exam: GENERAL: No acute distress. HEENT: normal  NECK: Supple, JVP  5-6   2+ bilaterally, no bruits.  No lymphadenopathy or thyromegaly  appreciated.   CARDIAC:  Mechanical heart sounds with LVAD hum present.  LUNGS:  Clear to auscultation bilaterally.  ABDOMEN:  large hernia. , round, nontender, positive bowel sounds x4.     LVAD exit site:   Dressing dry and intact.  No erythema or drainage.  Stabilization device present and accurately applied.   'EXTREMITIES:  Warm and dry, no cyanosis, clubbing, rash or edema  NEUROLOGIC:  Alert and oriented x 3.    No aphasia.  No dysarthria.  Affect pleasant.     Telemetry  SR   Labs   Basic Metabolic Panel: Recent Labs  Lab 08/02/23 0655 08/03/23 0650 08/04/23 0904 08/05/23 0803 08/06/23 0706  NA 130* 134* 134* 135 137  K 4.3 4.3 4.0 4.1 4.1  CL 96* 98 99 100 101  CO2 23 25 25 24  20*  GLUCOSE 232* 123* 170* 89 164*  BUN 26* 24* 27* 30* 30*  CREATININE 1.14 1.04 1.17 1.19 1.38*  CALCIUM 8.7* 9.3 9.0 9.1 9.2  MG 1.9 1.9 1.8 2.0 1.9    Liver Function Tests: No results for input(s): "AST", "ALT", "ALKPHOS", "BILITOT", "PROT", "ALBUMIN" in the last 168 hours.  CBC: Recent Labs  Lab 08/02/23 0655 08/03/23 0650 08/04/23 0904 08/05/23 0803 08/06/23 0706  WBC 7.2 7.3 7.5 6.9 6.0  HGB 10.6* 10.6* 10.2* 9.9* 10.6*  HCT 33.1* 33.4* 31.6* 30.7* 33.4*  MCV 85.5 84.8 83.8 83.9 84.3  PLT 229 246 228 255 269  INR: Recent Labs  Lab 08/02/23 0655 08/03/23 0650 08/04/23 0904 08/05/23 0803 08/06/23 0706  INR 2.1* 2.1* 2.2* 2.1* 2.1*    Other results:    Imaging   No results found.   Medications:     Scheduled Medications:  allopurinol  100 mg Oral Daily   amLODipine  10 mg Oral Daily   clonazepam  0.5 mg Oral BID   fluticasone furoate-vilanterol  1 puff Inhalation Daily   And   umeclidinium bromide  1 puff Inhalation Daily   gabapentin  300 mg Oral BID   insulin aspart  0-20 Units Subcutaneous TID WC   insulin aspart  0-5 Units Subcutaneous QHS   insulin glargine-yfgn  14 Units Subcutaneous Daily   losartan  12.5 mg Oral Daily   metoprolol  succinate  25 mg Oral Daily   minocycline  200 mg Oral BID   mirtazapine  15 mg Oral QHS   pantoprazole  40 mg Oral Daily   sertraline  50 mg Oral Daily   tamsulosin  0.4 mg Oral Daily   thiamine  100 mg Oral Daily   traZODone  150 mg Oral QHS   warfarin  4 mg Oral Once per day on Tuesday Thursday   warfarin  6 mg Oral Once per day on Sunday Monday Wednesday Friday Saturday   Warfarin - Pharmacist Dosing Inpatient   Does not apply q1600   zinc sulfate  220 mg Oral Daily    Infusions:  ceFEPime (MAXIPIME) IV 2 g (08/06/23 2134)   DAPTOmycin (CUBICIN) 800 mg in sodium chloride 0.9 % IVPB Stopped (08/06/23 1403)    PRN Medications: acetaminophen, fluticasone, oxyCODONE, traMADol   Patient Profile  62 y.o. 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 Now s/p Heartmate 3 LVAD. Now admitted w/ suspected DL fracture after multiple falls and recurrent DL infection.  Assessment/Plan:   1.  LVAD Driveline infection: MRSA driveline infection, admitted in 6/23 and again in 7/23-8/23 with multiple trips to the OR for debridement.  Cultures have grown acinetobacter and MRSA. He was readmitted with driveline infection in 2/24, completed course of daptomycin for MRSA. Now readmitted w/ suspected DL fracture and DL infection. P/w "driveline communication fault" error message on LVAD.LVAD coordinator replaced modular cable and controller with resolution of driveline communication fault signal. CT C/A/P c/w prior imaging w/ similar appearance of mild circumferential thickening of the fat plane surrounding the drive line in the upper abdomen extending from the pump to the skin which may be chronic or represent an infectious process. No drainable fluid collection.  -Initial wound Cx + for Pseudomonas Aeruginosa  (pan-sensitive) and S. aureus. Now on Dapto, Cefepime and minocycline. BC x 2 NG. Taken to OR 7/31 for wound debridement + application of wound vac. Returned to OR 8/7 for wound wash out/debridement - Continue abx per ID. Appreciate their assistance. ID recommends 4 weeks of IV antibiotics. He is willing to stay in the hospital and complete IV antibiotics on 08/14/2023. - Will place another PIV. Would avoid PICC.  - Pain well controlled, improved with oxycodone and tramadol.  - Will need pain management once discharged.  2. Suspected DL fracture with multiple recent falls - Has had multiple falls during the last 2 months (prior to admit), one direct trauma to mid sternum (fell on cinder  block) - CT chest/abd/pelvis per above  - CXR w/ LVAD partially imaged. The associated lead noted to be intact w/ no fracture or kink  - LVAD coordinators changed modular cable and controller with resolution  - No alarms   3.  Chronic systolic CHF s/p LVAD: Echo 10/21 with EF 20-25%, mildly decreased RV function. LHC/RHC in 12/21 with patent grafts, low output. Suspect mixed ischemic/nonischemic cardiomyopathy (prior heavy ETOH and drugs as well as CAD). Admitted with cardiogenic shock in 12/21, had placement of Impella 5.5 initially, now s/p Heartmate 3 LVAD on 11/17/20.  Ramp echo 2/23 with speed decreased to 5500 rpm. Speed now back to 5600 rpm.  LVAD parameters stable.  - Volume status stable.  - Continue current Toprol XL 25 mg daily.  - Did not tolerate Entresto in the past d/t AKI and low MAPs.  - MAPs better controlled. Continue losartan 12.5 mg daily.   - INR/BMET/LDH pending  4. CKD stage 3: Prior CTA abdomen/pelvis with high-grade stenosis of proximal right renal artery.  BMET pending.  5.  CAD: S/p CABG 12/20.  LHC pre-VAD with patent grafts, no target for intervention. - Continue Crestor 20 mg daily.   6.  Atrial fibrillation: Paroxysmal.  S/p Maze and LA appendage clip with CABG in 12/20.  Now in  NSR.  - Continue Toprol XL 25 mg daily.  - Off amiodarone with hyperthyroidism.  7. Type 2 diabetes: improved control, Hgb A1c down from 12.1>>6.3  - continue SSI   8. 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 He says last use of methamphetamine was > 2 months ago. - UDS this admit negative (however collected > 48 hrs post admit)  - Declines substance use resources  9. 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  Followed by endocrinology. He is now off amiodarone. He had been on methimazole but has not been taking recently. TSH was high on admit and free T4 normal  - Will NOT restart methimazole   10. Anemia (chronic blood loss):  CBC has been stable.    11. COPD: Prior smoker.  - Continue Trelegy.   12. PVCs/NSVT:  - Now off amiodarone.   13.  Depression: Has turned to substance abuse to cope.   - Has been referred to OP psych in [place]   14.  Gout: Continue allopurinol, he has prn colchicine.   15.  HTN:  - Maps 90-100. If BMET ok will increase losartan to 25 mg daily. Continue Amlodipine 10 mg.   16. Agitation: Klonopin increased, agitation improved  17. Incisional hernia: Large abdominal wall hernia in close proximity to driveline. Cannot tolerate wearing abdominal binder.  Has chronic discomfort from the hernia and the driveline.  It is not incarcerated.  - Continue oxycodone. Alternating with Tramadol. - No good option for hernia repair given driveline proximity.   18. Constipation Add colace and miralax.   I reviewed the LVAD parameters from today, and compared the results to the patient's prior recorded data.  No programming changes were made.  The LVAD is functioning within specified parameters.  The patient performs LVAD self-test daily.  LVAD interrogation was negative for any significant power changes, alarms or PI events/speed drops.  LVAD equipment check completed and is in good working order.  Back-up equipment present.   LVAD  education done on emergency procedures and precautions and reviewed exit site care.  Length of Stay: 59  Tonye Becket, NP 08/07/2023, 7:11 AM  VAD Team --- VAD ISSUES ONLY--- Pager  [phone] (7am - 7am)  Advanced Heart Failure Team  Pager [phone] (M-F; 7a - 5p)  Please contact [facility] for night-coverage after hours (5p -7a ) and   Patient seen with NP, agree with the above note.   MAP mildly elevated in 90s. No complaints, getting IV abx.     General: Well appearing this am. NAD.  HEENT: Normal. Neck: Supple, JVP 7-8 cm. Carotids OK.  Cardiac:  Mechanical heart sounds with LVAD hum present.  Lungs:  CTAB, normal effort.  Abdomen:  NT, ND, no HSM. No bruits or masses. Has large ventral hernia.  LVAD exit site: Well-healed and incorporated. Dressing dry and intact. No erythema or drainage. Stabilization device present and accurately applied. Driveline dressing changed daily per sterile technique. Extremities:  Warm and dry. No cyanosis, clubbing, rash, or edema.  Neuro:  Alert & oriented x 3. Cranial nerves grossly intact. Moves all 4 extremities w/o difficulty. Affect pleasant    Continue current BP meds.   Would replace PIV not PICC.    He will remain in hospital to complete IV abx on 9/5.    LVAD parameters stable.  Marca Ancona 08/07/2023

## 2023-08-07 NOTE — Progress Notes (Signed)
Patient woke up this morning with IV out and lying in the bed. Patient states he would like to talk to MD about getting PICC line before getting another PIV d/t patient needing a new PIV every couple of days.

## 2023-08-07 NOTE — Progress Notes (Signed)
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 Started on IV antibiotics.   Patient presented to VAD clinic 7/29 for driveline communication fault- stating that he has had "alarms every couple hrs from his VAD for a couple months." Pt has no showed and cancelled all appointments in the past 2 months. Pt also had green/blue drainage visible through his driveline bandage. (Dressing change and wound cx obtained). Pt states that he has experienced dizziness associated with a fall multiple times over the past couple months but no signs of bleeding. Controller and modular cable exchanged. No further communication fault alarms noted.   CT Chest/Abd/Pelvis 7/29 1. Similar appearance of mild circumferential thickening of the fat plane surrounding the drive line in the upper abdomen extending from the pump to the skin which may be chronic or represent an infectious process. No drainable fluid collection.  Pt had drive line debridement in OR with placement of instillation wound vac on 07/09/23 with Dr Donata Clay. This was change to wet/dry on Friday 8/2 due to ongoing alarms related to the pts hernia.   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 Currently on IV Cefepime Q 8 hrs and IV Daptomycin 800 mg daily per ID team. ID team is recommending 4 weeks IV antibiotics. Pt is refusing to go a facility for IV antibiotics.   Pt lying in bed on my arrival. Denies complaints. ID recommending 1 more weeks of IV antibiotics- end date 9/5.   Vital signs: Temp: 97.8 HR: 70 Doppler Pressure: 88 Automatic BP: 104/86 (93) O2 Sat: 98% on RA Wt: 193.3>194.8>196.4>194.7>...>211.4>209.2>206.8>208.4>211.6>208.3>208.8>210.7>203.9 lbs  LVAD interrogation reveals:  Speed: 5600 Flow: 4.5 Power: 4.2 w PI: 2.3 Alarms: none Events: 30 today; 55 yest Hct: 31  Fixed speed:  5600 Low speed limit:   5300  Drive Line: Existing VAD dressing removed and site care performed using sterile technique. Wound bed cleansed with VASHE solution and sterile 4x4 gauze through light manual debridement. Skin surrounding wound bed cleaned with Chlora prep applicators x 2, allowed to dry. VASHE moistened 2 x 2s  placed in wound bed, covered with several dry 4 x 4s. Wound tunnels approx 2 cm.  Drive line unincorporated. Small amount of thick yellow/serosanguinous drainage noted. No redness, tenderness, foul odor or rash noted.  Drive line anchor secured.   Continue daily wet to dry dressing changes using VASHE solution per bedside RN or VAD coordinator. Next dressing change due 08/08/23 by bedside nurse.        Labs:  LDH trend: 121>133>130>138>172>175>165>158>169>155>170>167>173>161>163>156>165>168>158>149>153>167>185>205  INR trend: 1.0>1.1>1.2>1.4>1.3>1.4>1.5>1.4>1.8>1.9>1.7>1.8>1.9>2.1>2.2>1.2>2.1>2.6>2.2>2.1>2.1>2  Anticoagulation Plan: -INR Goal: 2.0 - 2.5 -ASA Dose: none  ICD: N/A  Infection:  07/07/23>> blood cultures>> no growth 5 days; final  07/07/23>> wound cx>> few pseudomonas aeruginosa and MRSA; final  07/09/23>> OR driveline cx>> Few pseudomonas aeruginosa and METHICILLIN RESISTANT STAPHYLOCOCCUS AUREUS 07/09/23>> blood cultures>>no growth x 5 days; final  Drips:    Plan/Recommendations:  Page VAD coordinator with equipment issues or driveline problems Daily drive line wet to dry dressing changes with VASHE solution.   Carlton Adam RN VAD Coordinator  Office: [phone]  24/7 Pager: [phone]

## 2023-08-07 NOTE — Plan of Care (Signed)
  Problem: Education: Goal: Patient will understand all VAD equipment and how it functions Outcome: Progressing Goal: Patient will be able to verbalize current INR target range and antiplatelet therapy for discharge home Outcome: Progressing   Problem: Cardiac: Goal: LVAD will function as expected and patient will experience no clinical alarms Outcome: Progressing   Problem: Education: Goal: Knowledge of General Education information will improve Description: Including pain rating scale, medication(s)/side effects and non-pharmacologic comfort measures Outcome: Progressing   Problem: Health Behavior/Discharge Planning: Goal: Ability to manage health-related needs will improve Outcome: Progressing   Problem: Clinical Measurements: Goal: Ability to maintain clinical measurements within normal limits will improve Outcome: Progressing Goal: Will remain free from infection Outcome: Progressing Goal: Diagnostic test results will improve Outcome: Progressing Goal: Respiratory complications will improve Outcome: Progressing Goal: Cardiovascular complication will be avoided Outcome: Progressing   Problem: Activity: Goal: Risk for activity intolerance will decrease Outcome: Progressing   Problem: Nutrition: Goal: Adequate nutrition will be maintained Outcome: Progressing   Problem: Coping: Goal: Level of anxiety will decrease Outcome: Progressing   Problem: Elimination: Goal: Will not experience complications related to bowel motility Outcome: Progressing Goal: Will not experience complications related to urinary retention Outcome: Progressing   Problem: Pain Managment: Goal: General experience of comfort will improve Outcome: Progressing   Problem: Safety: Goal: Ability to remain free from injury will improve Outcome: Progressing   Problem: Skin Integrity: Goal: Risk for impaired skin integrity will decrease Outcome: Progressing   Problem: Education: Goal: Ability  to describe self-care measures that may prevent or decrease complications (Diabetes Survival Skills Education) will improve Outcome: Progressing Goal: Individualized Educational Video(s) Outcome: Progressing   Problem: Coping: Goal: Ability to adjust to condition or change in health will improve Outcome: Progressing   Problem: Health Behavior/Discharge Planning: Goal: Ability to identify and utilize available resources and services will improve Outcome: Progressing Goal: Ability to manage health-related needs will improve Outcome: Progressing   Problem: Fluid Volume: Goal: Ability to maintain a balanced intake and output will improve Outcome: Progressing   Problem: Metabolic: Goal: Ability to maintain appropriate glucose levels will improve Outcome: Progressing   Problem: Nutritional: Goal: Maintenance of adequate nutrition will improve Outcome: Progressing Goal: Progress toward achieving an optimal weight will improve Outcome: Progressing   Problem: Skin Integrity: Goal: Risk for impaired skin integrity will decrease Outcome: Progressing   Problem: Tissue Perfusion: Goal: Adequacy of tissue perfusion will improve Outcome: Progressing

## 2023-08-08 DIAGNOSIS — T827XXA Infection and inflammatory reaction due to other cardiac and vascular devices, implants and grafts, initial encounter: Secondary | ICD-10-CM | POA: Diagnosis not present

## 2023-08-08 LAB — BASIC METABOLIC PANEL
Anion gap: 9 (ref 5–15)
BUN: 26 mg/dL — ABNORMAL HIGH (ref 8–23)
CO2: 25 mmol/L (ref 22–32)
Calcium: 8.9 mg/dL (ref 8.9–10.3)
Chloride: 103 mmol/L (ref 98–111)
Creatinine, Ser: 1.09 mg/dL (ref 0.61–1.24)
GFR, Estimated: >60 mL/min (ref >60)
Glucose, Bld: 130 mg/dL — ABNORMAL HIGH (ref 70–99)
Potassium: 4.2 mmol/L (ref 3.5–5.1)
Sodium: 137 mmol/L (ref 135–145)

## 2023-08-08 LAB — GLUCOSE, CAPILLARY
Glucose-Capillary: 114 mg/dL — ABNORMAL HIGH (ref 70–99)
Glucose-Capillary: 132 mg/dL — ABNORMAL HIGH (ref 70–99)
Glucose-Capillary: 249 mg/dL — ABNORMAL HIGH (ref 70–99)
Glucose-Capillary: 80 mg/dL (ref 70–99)

## 2023-08-08 LAB — MAGNESIUM: Magnesium: 1.9 mg/dL (ref 1.7–2.4)

## 2023-08-08 LAB — LACTATE DEHYDROGENASE: LDH: 153 U/L (ref 98–192)

## 2023-08-08 LAB — PROTIME-INR
INR: 2.1 — ABNORMAL HIGH (ref 0.8–1.2)
Prothrombin Time: 23.6 s — ABNORMAL HIGH (ref 11.4–15.2)

## 2023-08-08 MED ORDER — LOSARTAN POTASSIUM 25 MG PO TABS
25.0000 mg | ORAL_TABLET | Freq: Every day | ORAL | Status: DC
  Administered 2023-08-08 – 2023-08-09 (×2): 25 mg via ORAL
  Filled 2023-08-08 (×2): qty 1

## 2023-08-08 MED ORDER — MAGNESIUM SULFATE 2 GM/50ML IV SOLN
2.0000 g | Freq: Once | INTRAVENOUS | Status: AC
  Administered 2023-08-08: 2 g via INTRAVENOUS
  Filled 2023-08-08: qty 50

## 2023-08-08 NOTE — Plan of Care (Signed)
  Problem: Education: Goal: Patient will understand all VAD equipment and how it functions Outcome: Progressing Goal: Patient will be able to verbalize current INR target range and antiplatelet therapy for discharge home Outcome: Progressing   Problem: Cardiac: Goal: LVAD will function as expected and patient will experience no clinical alarms Outcome: Progressing   Problem: Education: Goal: Knowledge of General Education information will improve Description: Including pain rating scale, medication(s)/side effects and non-pharmacologic comfort measures Outcome: Progressing   Problem: Health Behavior/Discharge Planning: Goal: Ability to manage health-related needs will improve Outcome: Progressing   Problem: Clinical Measurements: Goal: Ability to maintain clinical measurements within normal limits will improve Outcome: Progressing Goal: Will remain free from infection Outcome: Progressing Goal: Diagnostic test results will improve Outcome: Progressing Goal: Respiratory complications will improve Outcome: Progressing Goal: Cardiovascular complication will be avoided Outcome: Progressing   Problem: Activity: Goal: Risk for activity intolerance will decrease Outcome: Progressing   Problem: Nutrition: Goal: Adequate nutrition will be maintained Outcome: Progressing   Problem: Coping: Goal: Level of anxiety will decrease Outcome: Progressing   Problem: Elimination: Goal: Will not experience complications related to bowel motility Outcome: Progressing Goal: Will not experience complications related to urinary retention Outcome: Progressing   Problem: Pain Managment: Goal: General experience of comfort will improve Outcome: Progressing   Problem: Safety: Goal: Ability to remain free from injury will improve Outcome: Progressing   Problem: Skin Integrity: Goal: Risk for impaired skin integrity will decrease Outcome: Progressing   

## 2023-08-08 NOTE — Progress Notes (Signed)
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 Started on IV antibiotics.   Patient presented to VAD clinic 7/29 for driveline communication fault- stating that he has had "alarms every couple hrs from his VAD for a couple months." Pt has no showed and cancelled all appointments in the past 2 months. Pt also had green/blue drainage visible through his driveline bandage. (Dressing change and wound cx obtained). Pt states that he has experienced dizziness associated with a fall multiple times over the past couple months but no signs of bleeding. Controller and modular cable exchanged. No further communication fault alarms noted.   CT Chest/Abd/Pelvis 7/29 1. Similar appearance of mild circumferential thickening of the fat plane surrounding the drive line in the upper abdomen extending from the pump to the skin which may be chronic or represent an infectious process. No drainable fluid collection.  Pt had drive line debridement in OR with placement of instillation wound vac on 07/09/23 with Dr Donata Clay. This was change to wet/dry on Friday 8/2 due to ongoing alarms related to the pts hernia.   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 Currently on IV Cefepime Q 8 hrs and IV Daptomycin 800 mg daily per ID team. ID team is recommending 4 weeks IV antibiotics. Pt is refusing to go a facility for IV antibiotics.   Pt lying in bed on my arrival. Denies complaints. ID recommending 1 more weeks of IV antibiotics- end date 9/5.   Vital signs: Temp: 97.8 HR: 82 Doppler Pressure: 98 Automatic BP: 104/86 (93) O2 Sat: 98% on RA Wt: 193.3>194.8>196.4>194.7>...>211.4>209.2>206.8>208.4>211.6>208.3>208.8>210.7>203.9>198.8 lbs  LVAD interrogation reveals:  Speed: 5600 Flow: 4.2 Power: 4.2 w PI: 2.9 Alarms: none Events: 12 today Hct: 31  Fixed speed:  5600 Low speed limit:   5300  Drive Line: Existing VAD dressing removed and site care performed using sterile technique. Wound bed cleansed with VASHE solution and sterile 4x4 gauze through light manual debridement. Skin surrounding wound bed cleaned with Chlora prep applicators x 2, allowed to dry. VASHE moistened 2 x 2s  placed in wound bed, covered with several dry 4 x 4s. Wound tunnels approx 2 cm.  Drive line unincorporated. Small amount of thick yellow/serosanguinous drainage noted. No redness, tenderness, foul odor or rash noted.  Drive line anchor secured.   Continue daily wet to dry dressing changes using VASHE solution per bedside RN or VAD coordinator. Next dressing change due 08/09/23 by bedside nurse.       Labs:  LDH trend: 121>133>130>138>172>175>165>158>169>155>170>167>173>161>163>156>165>168>158>149>153>167>185>205>>153  INR trend: 1.0>1.1>1.2>1.4>1.3>1.4>1.5>1.4>1.8>1.9>1.7>1.8>1.9>2.1>2.2>1.2>2.1>2.6>2.2>2.1>2.1>2>2.1  Anticoagulation Plan: -INR Goal: 2.0 - 2.5 -ASA Dose: none  ICD: N/A  Infection:  07/07/23>> blood cultures>> no growth 5 days; final  07/07/23>> wound cx>> few pseudomonas aeruginosa and MRSA; final  07/09/23>> OR driveline cx>> Few pseudomonas aeruginosa and METHICILLIN RESISTANT STAPHYLOCOCCUS AUREUS 07/09/23>> blood cultures>>no growth x 5 days; final  Drips:    Plan/Recommendations:  Page VAD coordinator with equipment issues or driveline problems Daily drive line wet to dry dressing changes with VASHE solution.   Carlton Adam RN VAD Coordinator  Office: [phone]  24/7 Pager: [phone]

## 2023-08-08 NOTE — Progress Notes (Addendum)
Patient ID: Kevin Rigg., male   DOB: [birth year], 62 y.o.   MRN: [id number]   Advanced Heart Failure VAD Team Note  PCP-Cardiologist: Nona Dell, MD  [facility]: Dr. Shirlee Latch   Subjective:    7/29: admitted w/ suspected DL fracture and DL infection. P/w "driveline communication fault" error message on LVAD. No pump stoppages. LVAD coordinator replaced modular cable and controller with resolution of driveline communication fault signal. CT C/A/P c/w prior imaging w/ mild circumferential thickening of the fat plane surrounding the drive line in the upper abdomen extending from the pump to the skin which may be chronic or represent an infectious process. No drainable fluid collection. Wound + BCx collected. Started on empiric per ID.  7/30: Wound Cx growing Pseudomonas Aeruginosa (pan-sensitive) and S. aureus.  7/31: OR for wound debridement + application of wound vac.   8/8: Returned to OR for I&D  Remains on Daptomycin, Cefepime and minocycline. Surgical wound with pseudomonas aeruginosa and MRSA. Bcx from 7/29 and 7/31 NG.   Feels better this morning. Asking for pain medicine.   LVAD INTERROGATION:  HeartMate III LVAD:   Flow 4.5 liters/min, speed 5650 power 4.2 PI  3.3  14 PI events.  Objective:    Vital Signs:   Temp:  [97.7 F (36.5 C)-98.3 F (36.8 C)] 98 F (36.7 C) (08/30 0635) Pulse Rate:  [60-76] 62 (08/30 0715) Resp:  [12-18] 13 (08/30 0635) BP: (95-130)/(81-103) 117/89 (08/30 0635) SpO2:  [95 %-98 %] 96 % (08/30 0635) Weight:  [90.2 kg] 90.2 kg (08/30 0635) Last BM Date : 08/07/23 Mean arterial Pressure 90-100s Intake/Output:   Intake/Output Summary (Last 24 hours) at 08/08/2023 0724 Last data filed at 08/08/2023 0300 Gross per 24 hour  Intake 1246.06 ml  Output --  Net 1246.06 ml    Physical Exam  General:  Well appearing. No resp difficulty HEENT: Normal Neck: supple. JVP ~6. Carotids 2+ bilat; no bruits. No lymphadenopathy or thyromegaly appreciated. Cor:  Mechanical heart sounds with LVAD hum present. Lungs: Clear Abdomen: soft, nontender, nondistended. No hepatosplenomegaly. No bruits or masses. Good bowel sounds. Large abd hernia Driveline: C/D/I; securement device intact. Gauze/Tegaderm dressing around DL exit site Extremities: no cyanosis, clubbing, rash, edema Neuro: alert & orientedx3, cranial nerves grossly intact. moves all 4 extremities w/o difficulty. Affect pleasant   Telemetry  NSR 90s (Personally reviewed)    Labs   Basic Metabolic Panel: Recent Labs  Lab 08/04/23 0904 08/05/23 0803 08/06/23 0706 08/07/23 0655 08/08/23 0320  NA 134* 135 137 136 137  K 4.0 4.1 4.1 4.0 4.2  CL 99 100 101 102 103  CO2 25 24 20* 20* 25  GLUCOSE 170* 89 164* 109* 130*  BUN 27* 30* 30* 25* 26*  CREATININE 1.17 1.19 1.38* 1.26* 1.09  CALCIUM 9.0 9.1 9.2 9.1 8.9  MG 1.8 2.0 1.9 2.1 1.9    Liver Function Tests: No results for input(s): "AST", "ALT", "ALKPHOS", "BILITOT", "PROT", "ALBUMIN" in the last 168 hours.  CBC: Recent Labs  Lab 08/03/23 0650 08/04/23 0904 08/05/23 0803 08/06/23 0706 08/07/23 0655  WBC 7.3 7.5 6.9 6.0 6.8  HGB 10.6* 10.2* 9.9* 10.6* 10.5*  HCT 33.4* 31.6* 30.7* 33.4* 31.9*  MCV 84.8 83.8 83.9 84.3 82.4  PLT 246 228 255 269 268    INR: Recent Labs  Lab 08/04/23 0904 08/05/23 0803 08/06/23 0706 08/07/23 0655 08/08/23 0320  INR 2.2* 2.1* 2.1* 2.0* 2.1*    Other results:    Imaging   No  results found.   Medications:     Scheduled Medications:  allopurinol  100 mg Oral Daily   amLODipine  10 mg Oral Daily   clonazepam  0.5 mg Oral BID   docusate sodium  100 mg Oral BID   fluticasone furoate-vilanterol  1 puff Inhalation Daily   And   umeclidinium bromide  1 puff Inhalation Daily   gabapentin  300 mg Oral BID   insulin aspart  0-20 Units Subcutaneous TID WC   insulin aspart  0-5 Units Subcutaneous QHS   insulin glargine-yfgn  14 Units Subcutaneous Daily   losartan  12.5 mg Oral  Daily   metoprolol succinate  25 mg Oral Daily   minocycline  200 mg Oral BID   mirtazapine  15 mg Oral QHS   pantoprazole  40 mg Oral Daily   polyethylene glycol  17 g Oral Daily   sertraline  50 mg Oral Daily   sodium chloride flush  10-40 mL Intracatheter Q12H   spironolactone  12.5 mg Oral Daily   tamsulosin  0.4 mg Oral Daily   thiamine  100 mg Oral Daily   traZODone  150 mg Oral QHS   warfarin  4 mg Oral Once per day on Tuesday Thursday   warfarin  6 mg Oral Once per day on Sunday Monday Wednesday Friday Saturday   Warfarin - Pharmacist Dosing Inpatient   Does not apply q1600   zinc sulfate  220 mg Oral Daily    Infusions:  ceFEPime (MAXIPIME) IV 2 g (08/08/23 0321)   DAPTOmycin (CUBICIN) 800 mg in sodium chloride 0.9 % IVPB Stopped (08/07/23 1508)    PRN Medications: acetaminophen, fluticasone, oxyCODONE, sodium chloride flush, traMADol   Patient Profile  62 y.o. 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 Now s/p Heartmate 3 LVAD. Now admitted w/ suspected DL fracture after multiple falls and recurrent DL infection.  Assessment/Plan:   1.  LVAD Driveline infection: MRSA driveline infection, admitted in 6/23 and again in 7/23-8/23 with multiple trips to the OR for debridement.  Cultures have grown acinetobacter and MRSA. He was readmitted with driveline infection in 2/24, completed course of daptomycin for MRSA. Now readmitted w/ suspected DL fracture and DL infection. P/w "driveline communication fault" error message on LVAD.LVAD coordinator replaced modular cable and controller with resolution of driveline communication fault signal. CT C/A/P c/w prior imaging w/ similar appearance of mild circumferential thickening of the fat plane surrounding the drive line in the upper abdomen  extending from the pump to the skin which may be chronic or represent an infectious process. No drainable fluid collection.  -Initial wound Cx + for Pseudomonas Aeruginosa (pan-sensitive) and S. aureus. Now on Dapto, Cefepime and minocycline. BC x 2 NG. Taken to OR 7/31 for wound debridement + application of wound vac. Returned to OR 8/7 for wound wash out/debridement - Continue abx per ID. Appreciate their assistance. ID recommends 4 weeks of IV antibiotics. He is willing to stay in the hospital and complete IV antibiotics on 08/14/2023. - Now with midline.  - Pain well controlled, improved with oxycodone and tramadol.  - Will need pain management once discharged.  2. Suspected DL fracture with multiple recent falls - Has had multiple falls during the last 2 months (prior to admit), one direct trauma to mid sternum (fell on  cinder block) - CT chest/abd/pelvis per above  - CXR w/ LVAD partially imaged. The associated lead noted to be intact w/ no fracture or kink  - LVAD coordinators changed modular cable and controller with resolution  - No alarms   3.  Chronic systolic CHF s/p LVAD: Echo 10/21 with EF 20-25%, mildly decreased RV function. LHC/RHC in 12/21 with patent grafts, low output. Suspect mixed ischemic/nonischemic cardiomyopathy (prior heavy ETOH and drugs as well as CAD). Admitted with cardiogenic shock in 12/21, had placement of Impella 5.5 initially, now s/p Heartmate 3 LVAD on 11/17/20.  Ramp echo 2/23 with speed decreased to 5500 rpm. Speed now back to 5600 rpm.  LVAD parameters stable.  - Volume status stable.  - Continue current Toprol XL 25 mg daily.  - Did not tolerate Entresto in the past d/t AKI and low MAPs.  - MAPs elevated. Increase losartan 12.5>25 mg daily.   - INR 2., LDH stable  4. CKD stage 3: Prior CTA abdomen/pelvis with high-grade stenosis of proximal right renal artery.  SCr stable  5.  CAD: S/p CABG 12/20.  LHC pre-VAD with patent grafts, no target for  intervention. - Continue Crestor 20 mg daily.   6.  Atrial fibrillation: Paroxysmal.  S/p Maze and LA appendage clip with CABG in 12/20.  Now in NSR.  - Continue Toprol XL 25 mg daily.  - Off amiodarone with hyperthyroidism.  7. Type 2 diabetes: improved control, Hgb A1c down from 12.1>>6.3  - continue SSI   8. 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 He says last use of methamphetamine was > 2 months ago. - UDS this admit negative (however collected > 48 hrs post admit)  - Declines substance use resources  9. 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  Followed by endocrinology. He is now off amiodarone. He had been on methimazole but has not been taking recently. TSH was high on admit and free T4 normal  - Will NOT restart methimazole   10. Anemia (chronic blood loss):  CBC has been stable.    11. COPD: Prior smoker.  - Continue Trelegy.   12. PVCs/NSVT:  - Now off amiodarone.   13.  Depression: Has turned to substance abuse to cope.   - Has been referred to OP psych in [place]   14.  Gout: Continue allopurinol, he has prn colchicine.   15.  HTN:  - Maps 90-100. Increase losartan to 25 mg daily. Continue Amlodipine 10 mg.   16. Agitation: Klonopin increased, agitation improved  17. Incisional hernia: Large abdominal wall hernia in close proximity to driveline. Cannot tolerate wearing abdominal binder.  Has chronic discomfort from the hernia and the driveline.  It is not incarcerated.  - Continue oxycodone. Alternating with Tramadol. - No good option for hernia repair given driveline proximity.   18. Constipation - Continue colace and miralax.   I reviewed the LVAD parameters from today, and compared the results to the patient's prior recorded data.  No programming changes were made.  The LVAD is functioning within specified parameters.  The patient performs LVAD self-test daily.  LVAD interrogation was negative for any significant power  changes, alarms or PI events/speed drops.  LVAD equipment check completed and is in good working order.  Back-up equipment present.   LVAD education done on emergency procedures and precautions and reviewed exit site care.  Length of Stay: 64  Kevin Bleacher, NP 08/08/2023, 7:24 AM  VAD Team --- VAD ISSUES ONLY--- Pager  [phone] (7am - 7am)  Advanced Heart Failure Team  Pager [phone] (M-F; 7a - 5p)  Please contact [facility] for night-coverage after hours (5p -7a ) and   Patient seen with NP, agree with the above note.   MAP 90s-100s, still elevated.  Stable without complaints.   General: Well appearing this am. NAD.  HEENT: Normal. Neck: Supple, JVP 7-8 cm. Carotids OK.  Cardiac:  Mechanical heart sounds with LVAD hum present.  Lungs:  CTAB, normal effort.  Abdomen:  NT, ND, no HSM. No bruits or masses. +BS. Large ventral hernia, not tender.  LVAD exit site: Site dressed.  Extremities:  Warm and dry. No cyanosis, clubbing, rash, or edema.  Neuro:  Alert & oriented x 3. Cranial nerves grossly intact. Moves all 4 extremities w/o difficulty. Affect pleasant    VAD parameters stable. He will remain in hospital to 9/5 when he completes his IV abx.    MAP still high, increase losartan to 25 mg daily.   Marca Ancona 08/08/2023 3:32 PM

## 2023-08-08 NOTE — Progress Notes (Signed)
22 Days Post-Op Procedure(s) (LRB): DRIVELINE WOUND DEBRIDEMENT (N/A) Subjective: VAD wound examined and sterile Vashe dressing applied, wound stable with moderate drainage. Proximal 2 cm tunnel easily packed with 4x4.  Objective: Vital signs in last 24 hours: Temp:  [97.5 F (36.4 C)-98.3 F (36.8 C)] 97.9 F (36.6 C) (08/30 1222) Pulse Rate:  [60-82] 82 (08/30 1222) Cardiac Rhythm: Normal sinus rhythm;Heart block (08/30 0800) Resp:  [12-17] 13 (08/30 0738) BP: (105-124)/(87-103) 105/87 (08/30 0738) SpO2:  [95 %-98 %] 96 % (08/30 0738) Weight:  [90.2 kg] 90.2 kg (08/30 0635)  Hemodynamic parameters for last 24 hours:  Nsr afebrile  Intake/Output from previous day: 08/29 0701 - 08/30 0700 In: 1346.1 [P.O.:1080; IV Piggyback:266.1] Out: -  Intake/Output this shift: No intake/output data recorded.  EXAM Abdomen nontender Lungs clear Normal VAD hum  Lab Results: Recent Labs    08/06/23 0706 08/07/23 0655  WBC 6.0 6.8  HGB 10.6* 10.5*  HCT 33.4* 31.9*  PLT 269 268   BMET:  Recent Labs    08/07/23 0655 08/08/23 0320  NA 136 137  K 4.0 4.2  CL 102 103  CO2 20* 25  GLUCOSE 109* 130*  BUN 25* 26*  CREATININE 1.26* 1.09  CALCIUM 9.1 8.9    PT/INR:  Recent Labs    08/08/23 0320  LABPROT 23.6*  INR 2.1*   ABG    Component Value Date/Time   PHART 7.508 (H) 11/20/2020 0438   HCO3 24.8 09/09/2021 0313   TCO2 29 11/20/2020 0438   ACIDBASEDEF 6.0 (H) 11/24/2019 1718   O2SAT 63.4 09/09/2021 0313   CBG (last 3)  Recent Labs    08/07/23 1701 08/08/23 0635 08/08/23 1113  GLUCAP 176* 114* 132*    Assessment/Plan: S/P Procedure(s) (LRB): DRIVELINE WOUND DEBRIDEMENT (N/A) Cont daily wound care , complete course of IV antibiotics   LOS: 32 days    Lovett Sox 08/08/2023

## 2023-08-08 NOTE — Progress Notes (Signed)
ANTICOAGULATION CONSULT NOTE  Pharmacy Consult for warfarin  Indication:  LVAD  Allergies  Allergen Reactions   Vancomycin Nausea And Vomiting and Other (See Comments)    Severe joint pain    Patient Measurements: Height: 5\' 10"  (177.8 cm) Weight: 90.2 kg (198 lb 13.7 oz) IBW/kg (Calculated) : 73 Heparin Dosing Weight: 91.4 kg   Vital Signs: Temp: 97.5 F (36.4 C) (08/30 0738) Temp Source: Oral (08/30 0738) BP: 105/87 (08/30 0738) Pulse Rate: 62 (08/30 0715)  Labs: Recent Labs    08/06/23 0706 08/07/23 0655 08/08/23 0320  HGB 10.6* 10.5*  --   HCT 33.4* 31.9*  --   PLT 269 268  --   LABPROT 23.7* 23.2* 23.6*  INR 2.1* 2.0* 2.1*  CREATININE 1.38* 1.26* 1.09    Estimated Creatinine Clearance: 79.4 mL/min (by C-G formula based on SCr of 1.09 mg/dL).   Medical History: Past Medical History:  Diagnosis Date   "    Arthritis    CAD (coronary artery disease)    a. s/p CABG in 11/2019 with LIMA-LAD, SVG-OM1, SVG-PDA and SVG-D1   CHF (congestive heart failure) (HCC)    a. EF < 20% by echo in 11/2019   Essential hypertension    PAF (paroxysmal atrial fibrillation) (HCC)    Type 2 diabetes mellitus (HCC)     Medications:  Scheduled:   allopurinol  100 mg Oral Daily   amLODipine  10 mg Oral Daily   clonazepam  0.5 mg Oral BID   docusate sodium  100 mg Oral BID   fluticasone furoate-vilanterol  1 puff Inhalation Daily   And   umeclidinium bromide  1 puff Inhalation Daily   gabapentin  300 mg Oral BID   insulin aspart  0-20 Units Subcutaneous TID WC   insulin aspart  0-5 Units Subcutaneous QHS   insulin glargine-yfgn  14 Units Subcutaneous Daily   losartan  25 mg Oral Daily   metoprolol succinate  25 mg Oral Daily   minocycline  200 mg Oral BID   mirtazapine  15 mg Oral QHS   pantoprazole  40 mg Oral Daily   polyethylene glycol  17 g Oral Daily   sertraline  50 mg Oral Daily   sodium chloride flush  10-40 mL Intracatheter Q12H   spironolactone  12.5 mg  Oral Daily   tamsulosin  0.4 mg Oral Daily   thiamine  100 mg Oral Daily   traZODone  150 mg Oral QHS   warfarin  4 mg Oral Once per day on Tuesday Thursday   warfarin  6 mg Oral Once per day on Sunday Monday Wednesday Friday Saturday   Warfarin - Pharmacist Dosing Inpatient   Does not apply q1600   zinc sulfate  220 mg Oral Daily    Assessment: 62 yom who presented to clinic with concern for driveline fracture and infection. Last [facility] appointment was on 04/21/2023 - INR was subtherapeutic at 1.4 and regimen at that time was 6 mg daily except 4 mg Tues/Thurs (LD 7/25). Given low INR on admit, started fixed rate heparin 500 units/hr - now stopped.   INR remains therapeutic at 2.1, CBC and LDH stable. Prior home regimen of warfarin resumed and INR is stable.  Goal of Therapy:  INR 2-2.5 Monitor platelets by anticoagulation protocol: Yes   Plan:  Continue prior home dose of warfarin - 6mg  daily except 4mg  on Tues and Thurs Monitor daily INR, CBC, LDH, and for s/sx of bleeding   [name],  PharmD, BCPS, [facility] Clinical Pharmacist [phone] Please check AMION for all [facility] numbers 08/08/2023

## 2023-08-08 NOTE — Plan of Care (Signed)
  Problem: Education: Goal: Patient will understand all VAD equipment and how it functions Outcome: Progressing Goal: Patient will be able to verbalize current INR target range and antiplatelet therapy for discharge home Outcome: Progressing   Problem: Cardiac: Goal: LVAD will function as expected and patient will experience no clinical alarms Outcome: Progressing   Problem: Education: Goal: Knowledge of General Education information will improve Description: Including pain rating scale, medication(s)/side effects and non-pharmacologic comfort measures Outcome: Progressing   Problem: Health Behavior/Discharge Planning: Goal: Ability to manage health-related needs will improve Outcome: Progressing   Problem: Clinical Measurements: Goal: Ability to maintain clinical measurements within normal limits will improve Outcome: Progressing Goal: Will remain free from infection Outcome: Progressing Goal: Diagnostic test results will improve Outcome: Progressing Goal: Respiratory complications will improve Outcome: Progressing Goal: Cardiovascular complication will be avoided Outcome: Progressing   Problem: Activity: Goal: Risk for activity intolerance will decrease Outcome: Progressing   Problem: Nutrition: Goal: Adequate nutrition will be maintained Outcome: Progressing   Problem: Coping: Goal: Level of anxiety will decrease Outcome: Progressing   Problem: Elimination: Goal: Will not experience complications related to bowel motility Outcome: Progressing Goal: Will not experience complications related to urinary retention Outcome: Progressing   Problem: Pain Managment: Goal: General experience of comfort will improve Outcome: Progressing   Problem: Safety: Goal: Ability to remain free from injury will improve Outcome: Progressing   Problem: Skin Integrity: Goal: Risk for impaired skin integrity will decrease Outcome: Progressing   Problem: Education: Goal: Ability  to describe self-care measures that may prevent or decrease complications (Diabetes Survival Skills Education) will improve Outcome: Progressing Goal: Individualized Educational Video(s) Outcome: Progressing   Problem: Coping: Goal: Ability to adjust to condition or change in health will improve Outcome: Progressing   Problem: Fluid Volume: Goal: Ability to maintain a balanced intake and output will improve Outcome: Progressing   Problem: Health Behavior/Discharge Planning: Goal: Ability to identify and utilize available resources and services will improve Outcome: Progressing Goal: Ability to manage health-related needs will improve Outcome: Progressing   Problem: Metabolic: Goal: Ability to maintain appropriate glucose levels will improve Outcome: Progressing   Problem: Nutritional: Goal: Maintenance of adequate nutrition will improve Outcome: Progressing Goal: Progress toward achieving an optimal weight will improve Outcome: Progressing   Problem: Skin Integrity: Goal: Risk for impaired skin integrity will decrease Outcome: Progressing   Problem: Tissue Perfusion: Goal: Adequacy of tissue perfusion will improve Outcome: Progressing   

## 2023-08-09 DIAGNOSIS — T827XXA Infection and inflammatory reaction due to other cardiac and vascular devices, implants and grafts, initial encounter: Secondary | ICD-10-CM | POA: Diagnosis not present

## 2023-08-09 LAB — CBC
HCT: 31.2 % — ABNORMAL LOW (ref 39.0–52.0)
Hemoglobin: 10.1 g/dL — ABNORMAL LOW (ref 13.0–17.0)
MCH: 27.4 pg (ref 26.0–34.0)
MCHC: 32.4 g/dL (ref 30.0–36.0)
MCV: 84.6 fL (ref 80.0–100.0)
Platelets: 259 10*3/uL (ref 150–400)
RBC: 3.69 MIL/uL — ABNORMAL LOW (ref 4.22–5.81)
RDW: 14 % (ref 11.5–15.5)
WBC: 6.8 10*3/uL (ref 4.0–10.5)
nRBC: 0 % (ref 0.0–0.2)

## 2023-08-09 LAB — GLUCOSE, CAPILLARY
Glucose-Capillary: 139 mg/dL — ABNORMAL HIGH (ref 70–99)
Glucose-Capillary: 98 mg/dL (ref 70–99)
Glucose-Capillary: 141 mg/dL — ABNORMAL HIGH (ref 70–99)
Glucose-Capillary: 158 mg/dL — ABNORMAL HIGH (ref 70–99)

## 2023-08-09 LAB — BASIC METABOLIC PANEL
Anion gap: 9 (ref 5–15)
BUN: 26 mg/dL — ABNORMAL HIGH (ref 8–23)
CO2: 25 mmol/L (ref 22–32)
Calcium: 8.9 mg/dL (ref 8.9–10.3)
Chloride: 102 mmol/L (ref 98–111)
Creatinine, Ser: 1.28 mg/dL — ABNORMAL HIGH (ref 0.61–1.24)
GFR, Estimated: >60 mL/min (ref >60)
Glucose, Bld: 138 mg/dL — ABNORMAL HIGH (ref 70–99)
Potassium: 4 mmol/L (ref 3.5–5.1)
Sodium: 136 mmol/L (ref 135–145)

## 2023-08-09 LAB — PROTIME-INR
INR: 1.9 — ABNORMAL HIGH (ref 0.8–1.2)
Prothrombin Time: 21.9 s — ABNORMAL HIGH (ref 11.4–15.2)

## 2023-08-09 LAB — MAGNESIUM: Magnesium: 2.2 mg/dL (ref 1.7–2.4)

## 2023-08-09 LAB — LACTATE DEHYDROGENASE: LDH: 171 U/L (ref 98–192)

## 2023-08-09 MED ORDER — LOSARTAN POTASSIUM 50 MG PO TABS
50.0000 mg | ORAL_TABLET | Freq: Every day | ORAL | Status: DC
  Administered 2023-08-10 – 2023-08-15 (×6): 50 mg via ORAL
  Filled 2023-08-09 (×6): qty 1

## 2023-08-09 MED ORDER — INSULIN GLARGINE-YFGN 100 UNIT/ML ~~LOC~~ SOLN
12.0000 [IU] | Freq: Every day | SUBCUTANEOUS | Status: DC
  Administered 2023-08-10 – 2023-08-15 (×6): 12 [IU] via SUBCUTANEOUS
  Filled 2023-08-09 (×6): qty 0.12

## 2023-08-09 NOTE — Progress Notes (Signed)
Patient ID: Kevin Rigg., male   DOB: Jan 03, 1961, 62 y.o.   MRN: [id number]   Advanced Heart Failure VAD Team Note  PCP-Cardiologist: Nona Dell, MD  [facility]: Dr. Shirlee Latch   Subjective:    7/29: admitted w/ suspected DL fracture and DL infection. P/w "driveline communication fault" error message on LVAD. No pump stoppages. LVAD coordinator replaced modular cable and controller with resolution of driveline communication fault signal. CT C/A/P c/w prior imaging w/ mild circumferential thickening of the fat plane surrounding the drive line in the upper abdomen extending from the pump to the skin which may be chronic or represent an infectious process. No drainable fluid collection. Wound + BCx collected. Started on empiric per ID.  7/30: Wound Cx growing Pseudomonas Aeruginosa (pan-sensitive) and S. aureus.  7/31: OR for wound debridement + application of wound vac.   8/8: Returned to OR for I&D  Remains on Daptomycin, Cefepime and minocycline. Surgical wound with pseudomonas aeruginosa and MRSA. Bcx from 7/29 and 7/31 NG.   Remains on IV abx. No f/c or chills. Says pain is well controlled today   LVAD INTERROGATION:  HeartMate III LVAD:   Flow 4.1 liters/min, speed 5650 power 4.0 PI  2.7  Occasional  PI events.  Objective:    Vital Signs:   Temp:  [97.7 F (36.5 C)-98.4 F (36.9 C)] 97.7 F (36.5 C) (08/31 0827) Pulse Rate:  [78-83] 83 (08/31 0827) Resp:  [12-15] 12 (08/31 0230) BP: (112-117)/(86-97) 114/95 (08/31 0827) SpO2:  [94 %-96 %] 94 % (08/31 0827) Weight:  [95.5 kg] 95.5 kg (08/31 0634) Last BM Date : 08/07/23 Mean arterial Pressure ~100 Intake/Output:   Intake/Output Summary (Last 24 hours) at 08/09/2023 1125 Last data filed at 08/08/2023 2015 Gross per 24 hour  Intake 480 ml  Output --  Net 480 ml    Physical Exam   General:  NAD.  HEENT: normal  Neck: supple. JVP not elevated.  Carotids 2+ bilat; no bruits. No lymphadenopathy or thryomegaly  appreciated. Cor: LVAD hum.  Lungs: Clear. Abdomen: obese soft, nontender, non-distended. No hepatosplenomegaly. No bruits or masses. Good bowel sounds. Driveline/wound dressing ok. AB binder in place over large incisional hernia  Extremities: no cyanosis, clubbing, rash. Warm no edema  Neuro: alert & oriented x 3. No focal deficits. Moves all 4 without problem   Telemetry  NSR 80-90s (Personally reviewed)    Labs   Basic Metabolic Panel: Recent Labs  Lab 08/05/23 0803 08/06/23 0706 08/07/23 0655 08/08/23 0320 08/09/23 0620  NA 135 137 136 137 136  K 4.1 4.1 4.0 4.2 4.0  CL 100 101 102 103 102  CO2 24 20* 20* 25 25  GLUCOSE 89 164* 109* 130* 138*  BUN 30* 30* 25* 26* 26*  CREATININE 1.19 1.38* 1.26* 1.09 1.28*  CALCIUM 9.1 9.2 9.1 8.9 8.9  MG 2.0 1.9 2.1 1.9 2.2    Liver Function Tests: No results for input(s): "AST", "ALT", "ALKPHOS", "BILITOT", "PROT", "ALBUMIN" in the last 168 hours.  CBC: Recent Labs  Lab 08/04/23 0904 08/05/23 0803 08/06/23 0706 08/07/23 0655 08/09/23 0620  WBC 7.5 6.9 6.0 6.8 6.8  HGB 10.2* 9.9* 10.6* 10.5* 10.1*  HCT 31.6* 30.7* 33.4* 31.9* 31.2*  MCV 83.8 83.9 84.3 82.4 84.6  PLT 228 255 269 268 259    INR: Recent Labs  Lab 08/05/23 0803 08/06/23 0706 08/07/23 0655 08/08/23 0320 08/09/23 0620  INR 2.1* 2.1* 2.0* 2.1* 1.9*    Other results:  Imaging   No results found.   Medications:     Scheduled Medications:  allopurinol  100 mg Oral Daily   amLODipine  10 mg Oral Daily   clonazepam  0.5 mg Oral BID   docusate sodium  100 mg Oral BID   fluticasone furoate-vilanterol  1 puff Inhalation Daily   And   umeclidinium bromide  1 puff Inhalation Daily   gabapentin  300 mg Oral BID   insulin aspart  0-20 Units Subcutaneous TID WC   insulin aspart  0-5 Units Subcutaneous QHS   insulin glargine-yfgn  14 Units Subcutaneous Daily   losartan  25 mg Oral Daily   metoprolol succinate  25 mg Oral Daily   minocycline  200  mg Oral BID   mirtazapine  15 mg Oral QHS   pantoprazole  40 mg Oral Daily   polyethylene glycol  17 g Oral Daily   sertraline  50 mg Oral Daily   sodium chloride flush  10-40 mL Intracatheter Q12H   spironolactone  12.5 mg Oral Daily   tamsulosin  0.4 mg Oral Daily   thiamine  100 mg Oral Daily   traZODone  150 mg Oral QHS   warfarin  4 mg Oral Once per day on Tuesday Thursday   warfarin  6 mg Oral Once per day on Sunday Monday Wednesday Friday Saturday   Warfarin - Pharmacist Dosing Inpatient   Does not apply q1600   zinc sulfate  220 mg Oral Daily    Infusions:  ceFEPime (MAXIPIME) IV 2 g (08/09/23 1050)   DAPTOmycin (CUBICIN) 800 mg in sodium chloride 0.9 % IVPB 800 mg (08/08/23 1353)    PRN Medications: acetaminophen, fluticasone, oxyCODONE, sodium chloride flush, traMADol   Patient Profile  62 y.o. 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 Now s/p Heartmate 3 LVAD. Now admitted w/ suspected DL fracture after multiple falls and recurrent DL infection.  Assessment/Plan:   1.  LVAD Driveline infection: MRSA driveline infection, admitted in 6/23 and again in 7/23-8/23 with multiple trips to the OR for debridement.  Cultures have grown acinetobacter and MRSA. He was readmitted with driveline infection in 2/24, completed course of daptomycin for MRSA. Now readmitted w/ suspected DL fracture and DL infection. P/w "driveline communication fault" error message on LVAD.LVAD coordinator replaced modular cable and controller with resolution of driveline communication fault signal. CT C/A/P c/w prior imaging w/ similar appearance of mild circumferential thickening of the fat plane surrounding the drive line in the upper abdomen extending from the pump to the skin which may be chronic or represent  an infectious process. No drainable fluid collection.  -Initial wound Cx + for Pseudomonas Aeruginosa (pan-sensitive) and S. aureus. Now on Dapto, Cefepime and minocycline. BC x 2 NG. Taken to OR 7/31 for wound debridement + application of wound vac. Returned to OR 8/7 for wound wash out/debridement - Continue abx per ID. Appreciate their assistance. ID recommends 4 weeks of IV antibiotics. He is willing to stay in the hospital and complete IV antibiotics on 08/14/2023.  - Remains AF - Pain well controlled, improved with oxycodone and tramadol.  - Will need pain management once discharged.  2. Suspected DL fracture with multiple recent falls - Has had multiple falls during the last 2 months (prior to admit), one direct trauma to  mid sternum (fell on cinder block) - CT chest/abd/pelvis per above  - CXR w/ LVAD partially imaged. The associated lead noted to be intact w/ no fracture or kink  - LVAD coordinators changed modular cable and controller with resolution  - No alarms currently  3.  Chronic systolic CHF s/p LVAD: Echo 10/21 with EF 20-25%, mildly decreased RV function. LHC/RHC in 12/21 with patent grafts, low output. Suspect mixed ischemic/nonischemic cardiomyopathy (prior heavy ETOH and drugs as well as CAD). Admitted with cardiogenic shock in 12/21, had placement of Impella 5.5 initially, now s/p Heartmate 3 LVAD on 11/17/20.  Ramp echo 2/23 with speed decreased to 5500 rpm. Speed now back to 5600 rpm.  LVAD parameters stable.  - Volume status stable.  - Continue current Toprol XL 25 mg daily.  - Did not tolerate Entresto in the past d/t AKI and low MAPs.  - MAPs elevated. Increase losartan 50 mg daily.   - INR 1.9, LDH stable at 171 Discussed warfarin dosing with PharmD personally. - VAD interrogated personally. Parameters stable.   4. CKD stage 3: Prior CTA abdomen/pelvis with high-grade stenosis of proximal right renal artery.  SCr stable  5.  CAD: S/p CABG 12/20.  LHC pre-VAD with  patent grafts, no target for intervention. - Continue Crestor 20 mg daily.   6.  Atrial fibrillation: Paroxysmal.  S/p Maze and LA appendage clip with CABG in 12/20.  Now in NSR.  - Continue Toprol XL 25 mg daily.  - Off amiodarone with hyperthyroidism. - In NSR  7. Type 2 diabetes: improved control, Hgb A1c down from 12.1>>6.3  - continue SSI   8. 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 He says last use of methamphetamine was > 2 months ago. - UDS this admit negative (however collected > 48 hrs post admit)  - Declines substance use resources  9. 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  Followed by endocrinology. He is now off amiodarone. He had been on methimazole but has not been taking recently. TSH was high on admit and free T4 normal  - Will NOT restart methimazole   10. Anemia (chronic blood loss):  CBC has been stable.    11. COPD: Prior smoker.  - Continue Trelegy.   12. PVCs/NSVT:  - Now off amiodarone.   13.  Depression: Has turned to substance abuse to cope.   - Has been referred to OP psych in [place]   14.  Gout: Continue allopurinol, he has prn colchicine.   15.  HTN:  - Maps 100. Increase losartan to 50 mg daily. Continue Amlodipine 10 mg.   16. Agitation: Klonopin increased, agitation improved  17. Incisional hernia: Large abdominal wall hernia in close proximity to driveline. Cannot tolerate wearing abdominal binder.  Has chronic discomfort from the hernia and the driveline.  It is not incarcerated.  - Continue oxycodone. Alternating with Tramadol. - No good option for hernia repair given driveline proximity and size of defect.   18. Constipation - Continue colace and miralax.   I reviewed the LVAD parameters from today, and compared the results to the patient's prior recorded data.  No programming changes were made.  The LVAD is functioning within specified parameters.  The patient performs LVAD self-test daily.  LVAD  interrogation was negative for any significant power changes, alarms or PI events/speed drops.  LVAD equipment check completed and is in good working order.  Back-up equipment present.   LVAD education done on emergency procedures and precautions  and reviewed exit site care.  Length of Stay: 50  Arvilla Meres, MD 08/09/2023, 11:25 AM  VAD Team --- VAD ISSUES ONLY--- Pager [phone] (7am - 7am)  Advanced Heart Failure Team  Pager [phone] (M-F; 7a - 5p)  Please contact [facility] for night-coverage after hours (5p -7a ) and

## 2023-08-09 NOTE — Plan of Care (Signed)
  Problem: Education: Goal: Patient will understand all VAD equipment and how it functions Outcome: Progressing   Problem: Cardiac: Goal: LVAD will function as expected and patient will experience no clinical alarms Outcome: Progressing   Problem: Education: Goal: Knowledge of General Education information will improve Description: Including pain rating scale, medication(s)/side effects and non-pharmacologic comfort measures Outcome: Progressing   Problem: Health Behavior/Discharge Planning: Goal: Ability to manage health-related needs will improve Outcome: Progressing   Problem: Activity: Goal: Risk for activity intolerance will decrease Outcome: Progressing   Problem: Nutrition: Goal: Adequate nutrition will be maintained Outcome: Progressing   Problem: Coping: Goal: Level of anxiety will decrease Outcome: Progressing

## 2023-08-09 NOTE — Progress Notes (Signed)
ANTICOAGULATION CONSULT NOTE  Pharmacy Consult for warfarin  Indication:  LVAD  Allergies  Allergen Reactions   Vancomycin Nausea And Vomiting and Other (See Comments)    Severe joint pain    Patient Measurements: Height: 5\' 10"  (177.8 cm) Weight: 95.5 kg (210 lb 8.6 oz) IBW/kg (Calculated) : 73 Heparin Dosing Weight: 91.4 kg   Vital Signs: Temp: 97.7 F (36.5 C) (08/31 0827) Temp Source: Oral (08/31 0827) BP: 114/95 (08/31 0827) Pulse Rate: 83 (08/31 0827)  Labs: Recent Labs    08/07/23 0655 08/08/23 0320 08/09/23 0620  HGB 10.5*  --  10.1*  HCT 31.9*  --  31.2*  PLT 268  --  259  LABPROT 23.2* 23.6* 21.9*  INR 2.0* 2.1* 1.9*  CREATININE 1.26* 1.09 1.28*    Estimated Creatinine Clearance: 69.4 mL/min (A) (by C-G formula based on SCr of 1.28 mg/dL (H)).   Medical History: Past Medical History:  Diagnosis Date   "    Arthritis    CAD (coronary artery disease)    a. s/p CABG in 11/2019 with LIMA-LAD, SVG-OM1, SVG-PDA and SVG-D1   CHF (congestive heart failure) (HCC)    a. EF < 20% by echo in 11/2019   Essential hypertension    PAF (paroxysmal atrial fibrillation) (HCC)    Type 2 diabetes mellitus (HCC)     Medications:  Scheduled:   allopurinol  100 mg Oral Daily   amLODipine  10 mg Oral Daily   clonazepam  0.5 mg Oral BID   docusate sodium  100 mg Oral BID   fluticasone furoate-vilanterol  1 puff Inhalation Daily   And   umeclidinium bromide  1 puff Inhalation Daily   gabapentin  300 mg Oral BID   insulin aspart  0-20 Units Subcutaneous TID WC   insulin aspart  0-5 Units Subcutaneous QHS   insulin glargine-yfgn  14 Units Subcutaneous Daily   [START ON 08/10/2023] losartan  50 mg Oral Daily   metoprolol succinate  25 mg Oral Daily   minocycline  200 mg Oral BID   mirtazapine  15 mg Oral QHS   pantoprazole  40 mg Oral Daily   polyethylene glycol  17 g Oral Daily   sertraline  50 mg Oral Daily   sodium chloride flush  10-40 mL Intracatheter Q12H    spironolactone  12.5 mg Oral Daily   tamsulosin  0.4 mg Oral Daily   thiamine  100 mg Oral Daily   traZODone  150 mg Oral QHS   warfarin  4 mg Oral Once per day on Tuesday Thursday   warfarin  6 mg Oral Once per day on Sunday Monday Wednesday Friday Saturday   Warfarin - Pharmacist Dosing Inpatient   Does not apply q1600   zinc sulfate  220 mg Oral Daily    Assessment: 62 yom who presented to clinic with concern for driveline fracture and infection. Last [facility] appointment was on 04/21/2023 - INR was subtherapeutic at 1.4 and regimen at that time was 6 mg daily except 4 mg Tues/Thurs (LD 7/25). Given low INR on admit, started fixed rate heparin 500 units/hr - now stopped.   INR 1.9, slightly below goal but has been therapeutic this admission on home regimen.  CBC, LDH stable.  Prior home regimen of warfarin resumed and INR is stable.  Goal of Therapy:  INR 2-2.5 Monitor platelets by anticoagulation protocol: Yes   Plan:  Continue prior home dose of warfarin - 6mg  daily except 4mg  on Tues and  Thurs Monitor daily INR, CBC, LDH, and for s/sx of bleeding   Trixie Rude, PharmD Clinical Pharmacist 08/09/2023  12:31 PM  Please check AMION for all [facility] Pharmacy phone numbers After 10:00 PM, call Main Pharmacy [phone]

## 2023-08-10 DIAGNOSIS — T827XXA Infection and inflammatory reaction due to other cardiac and vascular devices, implants and grafts, initial encounter: Secondary | ICD-10-CM | POA: Diagnosis not present

## 2023-08-10 LAB — BASIC METABOLIC PANEL
Anion gap: 11 (ref 5–15)
BUN: 26 mg/dL — ABNORMAL HIGH (ref 8–23)
CO2: 25 mmol/L (ref 22–32)
Calcium: 8.8 mg/dL — ABNORMAL LOW (ref 8.9–10.3)
Chloride: 102 mmol/L (ref 98–111)
Creatinine, Ser: 1.04 mg/dL (ref 0.61–1.24)
GFR, Estimated: >60 mL/min (ref >60)
Glucose, Bld: 174 mg/dL — ABNORMAL HIGH (ref 70–99)
Potassium: 4.3 mmol/L (ref 3.5–5.1)
Sodium: 138 mmol/L (ref 135–145)

## 2023-08-10 LAB — CBC
HCT: 28.7 % — ABNORMAL LOW (ref 39.0–52.0)
Hemoglobin: 9.2 g/dL — ABNORMAL LOW (ref 13.0–17.0)
MCH: 27.6 pg (ref 26.0–34.0)
MCHC: 32.1 g/dL (ref 30.0–36.0)
MCV: 86.2 fL (ref 80.0–100.0)
Platelets: 227 10*3/uL (ref 150–400)
RBC: 3.33 MIL/uL — ABNORMAL LOW (ref 4.22–5.81)
RDW: 14 % (ref 11.5–15.5)
WBC: 6.2 10*3/uL (ref 4.0–10.5)
nRBC: 0 % (ref 0.0–0.2)

## 2023-08-10 LAB — PROTIME-INR
INR: 2.1 — ABNORMAL HIGH (ref 0.8–1.2)
Prothrombin Time: 24.1 s — ABNORMAL HIGH (ref 11.4–15.2)

## 2023-08-10 LAB — LACTATE DEHYDROGENASE: LDH: 155 U/L (ref 98–192)

## 2023-08-10 LAB — GLUCOSE, CAPILLARY
Glucose-Capillary: 139 mg/dL — ABNORMAL HIGH (ref 70–99)
Glucose-Capillary: 116 mg/dL — ABNORMAL HIGH (ref 70–99)
Glucose-Capillary: 145 mg/dL — ABNORMAL HIGH (ref 70–99)
Glucose-Capillary: 188 mg/dL — ABNORMAL HIGH (ref 70–99)

## 2023-08-10 LAB — MAGNESIUM: Magnesium: 1.9 mg/dL (ref 1.7–2.4)

## 2023-08-10 MED ORDER — MAGNESIUM SULFATE 2 GM/50ML IV SOLN
2.0000 g | Freq: Once | INTRAVENOUS | Status: AC
  Administered 2023-08-10: 2 g via INTRAVENOUS
  Filled 2023-08-10: qty 50

## 2023-08-10 NOTE — Progress Notes (Signed)
ANTICOAGULATION CONSULT NOTE  Pharmacy Consult for warfarin  Indication:  LVAD  Allergies  Allergen Reactions   Vancomycin Nausea And Vomiting and Other (See Comments)    Severe joint pain    Patient Measurements: Height: 5\' 10"  (177.8 cm) Weight: 95.2 kg (209 lb 12.8 oz) IBW/kg (Calculated) : 73  Vital Signs: Temp: 97.7 F (36.5 C) (09/01 1123) Temp Source: Oral (09/01 1123) BP: 101/90 (09/01 1123) Pulse Rate: 80 (09/01 1123)  Labs: Recent Labs    08/08/23 0320 08/09/23 0620 08/10/23 0337  HGB  --  10.1* 9.2*  HCT  --  31.2* 28.7*  PLT  --  259 227  LABPROT 23.6* 21.9* 24.1*  INR 2.1* 1.9* 2.1*  CREATININE 1.09 1.28* 1.04    Estimated Creatinine Clearance: 85.3 mL/min (by C-G formula based on SCr of 1.04 mg/dL).   Medical History: Past Medical History:  Diagnosis Date   "    Arthritis    CAD (coronary artery disease)    a. s/p CABG in 11/2019 with LIMA-LAD, SVG-OM1, SVG-PDA and SVG-D1   CHF (congestive heart failure) (HCC)    a. EF < 20% by echo in 11/2019   Essential hypertension    PAF (paroxysmal atrial fibrillation) (HCC)    Type 2 diabetes mellitus (HCC)     Medications:  Scheduled:   allopurinol  100 mg Oral Daily   amLODipine  10 mg Oral Daily   clonazepam  0.5 mg Oral BID   docusate sodium  100 mg Oral BID   fluticasone furoate-vilanterol  1 puff Inhalation Daily   And   umeclidinium bromide  1 puff Inhalation Daily   gabapentin  300 mg Oral BID   insulin aspart  0-20 Units Subcutaneous TID WC   insulin aspart  0-5 Units Subcutaneous QHS   insulin glargine-yfgn  12 Units Subcutaneous Daily   losartan  50 mg Oral Daily   metoprolol succinate  25 mg Oral Daily   minocycline  200 mg Oral BID   mirtazapine  15 mg Oral QHS   pantoprazole  40 mg Oral Daily   polyethylene glycol  17 g Oral Daily   sertraline  50 mg Oral Daily   sodium chloride flush  10-40 mL Intracatheter Q12H   spironolactone  12.5 mg Oral Daily   tamsulosin  0.4 mg Oral  Daily   thiamine  100 mg Oral Daily   traZODone  150 mg Oral QHS   warfarin  4 mg Oral Once per day on Tuesday Thursday   warfarin  6 mg Oral Once per day on Sunday Monday Wednesday Friday Saturday   Warfarin - Pharmacist Dosing Inpatient   Does not apply q1600   zinc sulfate  220 mg Oral Daily    Assessment: 62 yom who presented to clinic with concern for driveline fracture and infection. Last [facility] appointment was on 04/21/2023 - INR was subtherapeutic at 1.4 and regimen at that time was 6 mg daily except 4 mg Tues/Thurs (LD 7/25). Given low INR on admit, started fixed rate heparin 500 units/hr - now stopped.   INR 2.1 therapeutic on home regimen.  Hgb down, platelets stable.  No bleeding.  Goal of Therapy:  INR 2-2.5 Monitor platelets by anticoagulation protocol: Yes   Plan:  Continue prior home dose of warfarin - 6mg  daily except 4mg  on Tues and Thurs Monitor daily INR, CBC, LDH, and for s/sx of bleeding   Trixie Rude, PharmD Clinical Pharmacist 08/10/2023  1:21 PM  Please check AMION  for all [facility] Pharmacy phone numbers After 10:00 PM, call Main Pharmacy [phone]

## 2023-08-10 NOTE — Progress Notes (Signed)
Patient ID: Kevin Rigg., male   DOB: [birth year], 62 y.o.   MRN: [id number]   Advanced Heart Failure VAD Team Note  PCP-Cardiologist: Nona Dell, MD  [facility]: Dr. Shirlee Latch   Subjective:    7/29: admitted w/ suspected DL fracture and DL infection. P/w "driveline communication fault" error message on LVAD. No pump stoppages. LVAD coordinator replaced modular cable and controller with resolution of driveline communication fault signal. CT C/A/P c/w prior imaging w/ mild circumferential thickening of the fat plane surrounding the drive line in the upper abdomen extending from the pump to the skin which may be chronic or represent an infectious process. No drainable fluid collection. Wound + BCx collected. Started on empiric per ID.  7/30: Wound Cx growing Pseudomonas Aeruginosa (pan-sensitive) and S. aureus.  7/31: OR for wound debridement + application of wound vac.   8/8: Returned to OR for I&D  Remains on Daptomycin, Cefepime and minocycline. Surgical wound with pseudomonas aeruginosa and MRSA. Bcx from 7/29 and 7/31 NG.   Remains on IV abx/ No fevers or chills. Says hernia is driving him crazy.   LVAD INTERROGATION:  HeartMate III LVAD:   Flow 4.7 liters/min, speed 5600 power 4.0 PI  3.0  VAD interrogated personally. Parameters stable. Objective:    Vital Signs:   Temp:  [97.7 F (36.5 C)-98.7 F (37.1 C)] 97.7 F (36.5 C) (09/01 1123) Pulse Rate:  [65-82] 80 (09/01 1123) Resp:  [10-17] 17 (09/01 0728) BP: (101-134)/(87-97) 101/90 (09/01 1123) SpO2:  [92 %-98 %] 98 % (09/01 1123) Weight:  [95.2 kg] 95.2 kg (09/01 0654) Last BM Date : 08/07/23 Mean arterial Pressure 90s Intake/Output:   Intake/Output Summary (Last 24 hours) at 08/10/2023 1255 Last data filed at 08/10/2023 0933 Gross per 24 hour  Intake 739.66 ml  Output --  Net 739.66 ml    Physical Exam   General:  NAD.  HEENT: normal  Neck: supple. JVP not elevated.  Carotids 2+ bilat; no bruits. No lymphadenopathy  or thryomegaly appreciated. Cor: LVAD hum.  Lungs: Clear. Abdomen: soft, nontender, non-distended. No hepatosplenomegaly. No bruits or masses. Good bowel sounds. Large hernia  DL /wound dressing ok   Extremities: no cyanosis, clubbing, rash. Warm no edema  Neuro: alert & oriented x 3. No focal deficits. Moves all 4 without problem    Telemetry  NSR 80-90s(Personally reviewed)    Labs   Basic Metabolic Panel: Recent Labs  Lab 08/06/23 0706 08/07/23 0655 08/08/23 0320 08/09/23 0620 08/10/23 0337  NA 137 136 137 136 138  K 4.1 4.0 4.2 4.0 4.3  CL 101 102 103 102 102  CO2 20* 20* 25 25 25   GLUCOSE 164* 109* 130* 138* 174*  BUN 30* 25* 26* 26* 26*  CREATININE 1.38* 1.26* 1.09 1.28* 1.04  CALCIUM 9.2 9.1 8.9 8.9 8.8*  MG 1.9 2.1 1.9 2.2 1.9    Liver Function Tests: No results for input(s): "AST", "ALT", "ALKPHOS", "BILITOT", "PROT", "ALBUMIN" in the last 168 hours.  CBC: Recent Labs  Lab 08/05/23 0803 08/06/23 0706 08/07/23 0655 08/09/23 0620 08/10/23 0337  WBC 6.9 6.0 6.8 6.8 6.2  HGB 9.9* 10.6* 10.5* 10.1* 9.2*  HCT 30.7* 33.4* 31.9* 31.2* 28.7*  MCV 83.9 84.3 82.4 84.6 86.2  PLT 255 269 268 259 227    INR: Recent Labs  Lab 08/06/23 0706 08/07/23 0655 08/08/23 0320 08/09/23 0620 08/10/23 0337  INR 2.1* 2.0* 2.1* 1.9* 2.1*    Other results:    Imaging   No  results found.   Medications:     Scheduled Medications:  allopurinol  100 mg Oral Daily   amLODipine  10 mg Oral Daily   clonazepam  0.5 mg Oral BID   docusate sodium  100 mg Oral BID   fluticasone furoate-vilanterol  1 puff Inhalation Daily   And   umeclidinium bromide  1 puff Inhalation Daily   gabapentin  300 mg Oral BID   insulin aspart  0-20 Units Subcutaneous TID WC   insulin aspart  0-5 Units Subcutaneous QHS   insulin glargine-yfgn  12 Units Subcutaneous Daily   losartan  50 mg Oral Daily   metoprolol succinate  25 mg Oral Daily   minocycline  200 mg Oral BID   mirtazapine   15 mg Oral QHS   pantoprazole  40 mg Oral Daily   polyethylene glycol  17 g Oral Daily   sertraline  50 mg Oral Daily   sodium chloride flush  10-40 mL Intracatheter Q12H   spironolactone  12.5 mg Oral Daily   tamsulosin  0.4 mg Oral Daily   thiamine  100 mg Oral Daily   traZODone  150 mg Oral QHS   warfarin  4 mg Oral Once per day on Tuesday Thursday   warfarin  6 mg Oral Once per day on Sunday Monday Wednesday Friday Saturday   Warfarin - Pharmacist Dosing Inpatient   Does not apply q1600   zinc sulfate  220 mg Oral Daily    Infusions:  ceFEPime (MAXIPIME) IV 2 g (08/10/23 1143)   DAPTOmycin (CUBICIN) 800 mg in sodium chloride 0.9 % IVPB Stopped (08/09/23 1431)    PRN Medications: acetaminophen, fluticasone, oxyCODONE, sodium chloride flush, traMADol   Patient Profile  62 y.o. 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 Now s/p Heartmate 3 LVAD. Now admitted w/ suspected DL fracture after multiple falls and recurrent DL infection.  Assessment/Plan:   1.  LVAD Driveline infection: MRSA driveline infection, admitted in 6/23 and again in 7/23-8/23 with multiple trips to the OR for debridement.  Cultures have grown acinetobacter and MRSA. He was readmitted with driveline infection in 2/24, completed course of daptomycin for MRSA. Now readmitted w/ suspected DL fracture and DL infection. P/w "driveline communication fault" error message on LVAD.LVAD coordinator replaced modular cable and controller with resolution of driveline communication fault signal. CT C/A/P c/w prior imaging w/ similar appearance of mild circumferential thickening of the fat plane surrounding the drive line in the upper abdomen extending from the pump to the skin which may be chronic or represent an infectious process. No  drainable fluid collection.  -Initial wound Cx + for Pseudomonas Aeruginosa (pan-sensitive) and S. aureus. Now on Dapto, Cefepime and minocycline. BC x 2 NG. Taken to OR 7/31 for wound debridement + application of wound vac. Returned to OR 8/7 for wound wash out/debridement - Continue abx per ID. Appreciate their assistance. ID recommends 4 weeks of IV antibiotics. He is willing to stay in the hospital and complete IV antibiotics on 08/14/2023.  - Remains AF on IV abx. Wound dressing ok   2. Suspected DL fracture with multiple recent falls - Has had multiple falls during the last 2 months (prior to admit), one direct trauma to mid sternum (fell on cinder block) - CT chest/abd/pelvis per above  - CXR w/  LVAD partially imaged. The associated lead noted to be intact w/ no fracture or kink  - LVAD coordinators changed modular cable and controller with resolution  - stable currently  3.  Chronic systolic CHF s/p LVAD: Echo 10/21 with EF 20-25%, mildly decreased RV function. LHC/RHC in 12/21 with patent grafts, low output. Suspect mixed ischemic/nonischemic cardiomyopathy (prior heavy ETOH and drugs as well as CAD). Admitted with cardiogenic shock in 12/21, had placement of Impella 5.5 initially, now s/p Heartmate 3 LVAD on 11/17/20.  Ramp echo 2/23 with speed decreased to 5500 rpm. Speed now back to 5600 rpm.  LVAD parameters stable.  - Volume status stable.  - Continue current Toprol XL 25 mg daily.  - Did not tolerate Entresto in the past d/t AKI and low MAPs.  - MAPs improving with increase in losartan to 100 daily. Can use entresto as needed   - INR 2.1, LDH stable at 155 Discussed dosing with PharmD personally. - VAD interrogated personally. Parameters stable.   4. CKD stage 3: Prior CTA abdomen/pelvis with high-grade stenosis of proximal right renal artery.  SCr stable  5.  CAD: S/p CABG 12/20.  LHC pre-VAD with patent grafts, no target for intervention. - Continue Crestor 20 mg daily.  - No  s/s angina  6.  Atrial fibrillation: Paroxysmal.  S/p Maze and LA appendage clip with CABG in 12/20.  Now in NSR.  - Continue Toprol XL 25 mg daily.  - Off amiodarone with hyperthyroidism. - In NSR  7. Type 2 diabetes: improved control, Hgb A1c down from 12.1>>6.3  - continue SSI   8. 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 He says last use of methamphetamine was > 2 months ago. - UDS this admit negative (however collected > 48 hrs post admit)  - Declines substance use resources  9. 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  Followed by endocrinology. He is now off amiodarone. He had been on methimazole but has not been taking recently. TSH was high on admit and free T4 normal  - Will NOT restart methimazole   10. Anemia (chronic blood loss):  CBC has been stable.    11. COPD: Prior smoker.  - Continue Trelegy.   12. PVCs/NSVT:  - Now off amiodarone.   13.  Depression: Has turned to substance abuse to cope.   - Has been referred to OP psych in [place]   14.  Gout: Continue allopurinol, he has prn colchicine.   15.  HTN:  - Maps 100. Increase losartan to 50 mg daily. Continue Amlodipine 10 mg.   16. Agitation: Klonopin increased, agitation improved  17. Incisional hernia: Large abdominal wall hernia in close proximity to driveline. Cannot tolerate wearing abdominal binder.  Has chronic discomfort from the hernia and the driveline.  It is not incarcerated.  - Continue oxycodone. Alternating with Tramadol. - No good option for hernia repair given driveline proximity and size of defect. We discussed this again today  18. Constipation - Continue colace and miralax.   I reviewed the LVAD parameters from today, and compared the results to the patient's prior recorded data.  No programming changes were made.  The LVAD is functioning within specified parameters.  The patient performs LVAD self-test daily.  LVAD interrogation was negative for any  significant power changes, alarms or PI events/speed drops.  LVAD equipment check completed and is in good working order.  Back-up equipment present.   LVAD education done on emergency procedures and precautions and reviewed  exit site care.  Length of Stay: 51  Arvilla Meres, MD 08/10/2023, 12:55 PM  VAD Team --- VAD ISSUES ONLY--- Pager [phone] (7am - 7am)  Advanced Heart Failure Team  Pager [phone] (M-F; 7a - 5p)  Please contact [facility] for night-coverage after hours (5p -7a ) and

## 2023-08-11 DIAGNOSIS — T827XXA Infection and inflammatory reaction due to other cardiac and vascular devices, implants and grafts, initial encounter: Secondary | ICD-10-CM | POA: Diagnosis not present

## 2023-08-11 LAB — BASIC METABOLIC PANEL
Anion gap: 9 (ref 5–15)
BUN: 25 mg/dL — ABNORMAL HIGH (ref 8–23)
CO2: 27 mmol/L (ref 22–32)
Calcium: 9.2 mg/dL (ref 8.9–10.3)
Chloride: 99 mmol/L (ref 98–111)
Creatinine, Ser: 1.1 mg/dL (ref 0.61–1.24)
GFR, Estimated: >60 mL/min (ref >60)
Glucose, Bld: 127 mg/dL — ABNORMAL HIGH (ref 70–99)
Potassium: 4.5 mmol/L (ref 3.5–5.1)
Sodium: 135 mmol/L (ref 135–145)

## 2023-08-11 LAB — MAGNESIUM: Magnesium: 2.1 mg/dL (ref 1.7–2.4)

## 2023-08-11 LAB — GLUCOSE, CAPILLARY
Glucose-Capillary: 125 mg/dL — ABNORMAL HIGH (ref 70–99)
Glucose-Capillary: 168 mg/dL — ABNORMAL HIGH (ref 70–99)
Glucose-Capillary: 127 mg/dL — ABNORMAL HIGH (ref 70–99)
Glucose-Capillary: 153 mg/dL — ABNORMAL HIGH (ref 70–99)

## 2023-08-11 LAB — CBC
HCT: 31.1 % — ABNORMAL LOW (ref 39.0–52.0)
Hemoglobin: 9.9 g/dL — ABNORMAL LOW (ref 13.0–17.0)
MCH: 26.9 pg (ref 26.0–34.0)
MCHC: 31.8 g/dL (ref 30.0–36.0)
MCV: 84.5 fL (ref 80.0–100.0)
Platelets: 237 10*3/uL (ref 150–400)
RBC: 3.68 MIL/uL — ABNORMAL LOW (ref 4.22–5.81)
RDW: 14 % (ref 11.5–15.5)
WBC: 7.5 10*3/uL (ref 4.0–10.5)
nRBC: 0 % (ref 0.0–0.2)

## 2023-08-11 LAB — LACTATE DEHYDROGENASE: LDH: 159 U/L (ref 98–192)

## 2023-08-11 LAB — PROTIME-INR
INR: 2 — ABNORMAL HIGH (ref 0.8–1.2)
Prothrombin Time: 22.7 s — ABNORMAL HIGH (ref 11.4–15.2)

## 2023-08-11 NOTE — Progress Notes (Signed)
ANTICOAGULATION CONSULT NOTE  Pharmacy Consult for warfarin  Indication:  LVAD  Allergies  Allergen Reactions   Vancomycin Nausea And Vomiting and Other (See Comments)    Severe joint pain    Patient Measurements: Height: 5\' 10"  (177.8 cm) Weight: 94.8 kg (208 lb 15.9 oz) IBW/kg (Calculated) : 73  Vital Signs: Temp: 97.6 F (36.4 C) (09/02 1103) Temp Source: Oral (09/02 1103) BP: 101/76 (09/02 1103) Pulse Rate: 70 (09/02 0356)  Labs: Recent Labs    08/09/23 0620 08/10/23 0337 08/11/23 0400  HGB 10.1* 9.2* 9.9*  HCT 31.2* 28.7* 31.1*  PLT 259 227 237  LABPROT 21.9* 24.1* 22.7*  INR 1.9* 2.1* 2.0*  CREATININE 1.28* 1.04 1.10    Estimated Creatinine Clearance: 80.5 mL/min (by C-G formula based on SCr of 1.1 mg/dL).   Medical History: Past Medical History:  Diagnosis Date   "    Arthritis    CAD (coronary artery disease)    a. s/p CABG in 11/2019 with LIMA-LAD, SVG-OM1, SVG-PDA and SVG-D1   CHF (congestive heart failure) (HCC)    a. EF < 20% by echo in 11/2019   Essential hypertension    PAF (paroxysmal atrial fibrillation) (HCC)    Type 2 diabetes mellitus (HCC)     Medications:  Scheduled:   allopurinol  100 mg Oral Daily   amLODipine  10 mg Oral Daily   clonazepam  0.5 mg Oral BID   docusate sodium  100 mg Oral BID   fluticasone furoate-vilanterol  1 puff Inhalation Daily   And   umeclidinium bromide  1 puff Inhalation Daily   gabapentin  300 mg Oral BID   insulin aspart  0-20 Units Subcutaneous TID WC   insulin aspart  0-5 Units Subcutaneous QHS   insulin glargine-yfgn  12 Units Subcutaneous Daily   losartan  50 mg Oral Daily   metoprolol succinate  25 mg Oral Daily   minocycline  200 mg Oral BID   mirtazapine  15 mg Oral QHS   pantoprazole  40 mg Oral Daily   polyethylene glycol  17 g Oral Daily   sertraline  50 mg Oral Daily   sodium chloride flush  10-40 mL Intracatheter Q12H   spironolactone  12.5 mg Oral Daily   tamsulosin  0.4 mg Oral  Daily   thiamine  100 mg Oral Daily   traZODone  150 mg Oral QHS   warfarin  4 mg Oral Once per day on Tuesday Thursday   warfarin  6 mg Oral Once per day on Sunday Monday Wednesday Friday Saturday   Warfarin - Pharmacist Dosing Inpatient   Does not apply q1600   zinc sulfate  220 mg Oral Daily    Assessment: 62 yom who presented to clinic with concern for driveline fracture and infection. Last [facility] appointment was on 04/21/2023 - INR was subtherapeutic at 1.4 and regimen at that time was 6 mg daily except 4 mg Tues/Thurs (LD 7/25). Given low INR on admit, started fixed rate heparin 500 units/hr - now stopped.   INR 2 is therapeutic on home regimen.  Hgb 9's, platelets stable.  No bleeding.  Goal of Therapy:  INR 2-2.5 Monitor platelets by anticoagulation protocol: Yes   Plan:  Continue prior home dose of warfarin - 6mg  daily except 4mg  on Tues and Thurs Monitor daily INR, CBC, LDH, and for s/sx of bleeding   Trixie Rude, PharmD Clinical Pharmacist 08/11/2023  11:55 AM  Please check AMION for all [facility] phone  numbers After 10:00 PM, call Main Pharmacy [phone]

## 2023-08-11 NOTE — Progress Notes (Signed)
Patient ID: Kevin Underwood., male   DOB: 1961-06-19, 62 y.o.   MRN: [id number]   Advanced Heart Failure VAD Team Note  PCP-Cardiologist: Nona Dell, MD  [facility]: Dr. Shirlee Latch   Subjective:    7/29: admitted w/ suspected DL fracture and DL infection. P/w "driveline communication fault" error message on LVAD. No pump stoppages. LVAD coordinator replaced modular cable and controller with resolution of driveline communication fault signal. CT C/A/P c/w prior imaging w/ mild circumferential thickening of the fat plane surrounding the drive line in the upper abdomen extending from the pump to the skin which may be chronic or represent an infectious process. No drainable fluid collection. Wound + BCx collected. Started on empiric per ID.  7/30: Wound Cx growing Pseudomonas Aeruginosa (pan-sensitive) and S. aureus.  7/31: OR for wound debridement + application of wound vac.   8/8: Returned to OR for I&D  Remains on Daptomycin, Cefepime and minocycline. Surgical wound with pseudomonas aeruginosa and MRSA. Bcx from 7/29 and 7/31 NG.   Remains on IV abx. No f/c. Hernia still bugging him.   LVAD INTERROGATION:  HeartMate III LVAD:   Flow 4.2 liters/min, speed 5600 power 4.0 PI  3.7  VAD interrogated personally. Parameters stable. Objective:    Vital Signs:   Temp:  [97.7 F (36.5 C)-98.4 F (36.9 C)] 97.7 F (36.5 C) (09/02 0726) Pulse Rate:  [70-80] 70 (09/02 0356) Resp:  [13-18] 18 (09/02 0726) BP: (101-130)/(83-95) 127/83 (09/02 0718) SpO2:  [92 %-98 %] 95 % (09/02 0356) Weight:  [94.8 kg] 94.8 kg (09/02 0646) Last BM Date : 08/11/23 Mean arterial Pressure 90s Intake/Output:   Intake/Output Summary (Last 24 hours) at 08/11/2023 0958 Last data filed at 08/11/2023 0830 Gross per 24 hour  Intake 516 ml  Output --  Net 516 ml    Physical Exam   General:  NAD.  HEENT: normal  Neck: supple. JVP not elevated.  Carotids 2+ bilat; no bruits. No lymphadenopathy or thryomegaly  appreciated. Cor: LVAD hum.  Lungs: Clear. Abdomen: obese soft, nontender, large hernia. DL and wound dressing ok Extremities: no cyanosis, clubbing, rash. Warm no edema  Neuro: alert & oriented x 3. No focal deficits. Moves all 4 without problem    Telemetry   NSR 70-80s (Personally reviewed)    Labs   Basic Metabolic Panel: Recent Labs  Lab 08/07/23 0655 08/08/23 0320 08/09/23 0620 08/10/23 0337 08/11/23 0400  NA 136 137 136 138 135  K 4.0 4.2 4.0 4.3 4.5  CL 102 103 102 102 99  CO2 20* 25 25 25 27   GLUCOSE 109* 130* 138* 174* 127*  BUN 25* 26* 26* 26* 25*  CREATININE 1.26* 1.09 1.28* 1.04 1.10  CALCIUM 9.1 8.9 8.9 8.8* 9.2  MG 2.1 1.9 2.2 1.9 2.1    Liver Function Tests: No results for input(s): "AST", "ALT", "ALKPHOS", "BILITOT", "PROT", "ALBUMIN" in the last 168 hours.  CBC: Recent Labs  Lab 08/06/23 0706 08/07/23 0655 08/09/23 0620 08/10/23 0337 08/11/23 0400  WBC 6.0 6.8 6.8 6.2 7.5  HGB 10.6* 10.5* 10.1* 9.2* 9.9*  HCT 33.4* 31.9* 31.2* 28.7* 31.1*  MCV 84.3 82.4 84.6 86.2 84.5  PLT 269 268 259 227 237    INR: Recent Labs  Lab 08/07/23 0655 08/08/23 0320 08/09/23 0620 08/10/23 0337 08/11/23 0400  INR 2.0* 2.1* 1.9* 2.1* 2.0*    Other results:    Imaging   No results found.   Medications:     Scheduled Medications:  allopurinol  100 mg Oral Daily   amLODipine  10 mg Oral Daily   clonazepam  0.5 mg Oral BID   docusate sodium  100 mg Oral BID   fluticasone furoate-vilanterol  1 puff Inhalation Daily   And   umeclidinium bromide  1 puff Inhalation Daily   gabapentin  300 mg Oral BID   insulin aspart  0-20 Units Subcutaneous TID WC   insulin aspart  0-5 Units Subcutaneous QHS   insulin glargine-yfgn  12 Units Subcutaneous Daily   losartan  50 mg Oral Daily   metoprolol succinate  25 mg Oral Daily   minocycline  200 mg Oral BID   mirtazapine  15 mg Oral QHS   pantoprazole  40 mg Oral Daily   polyethylene glycol  17 g Oral  Daily   sertraline  50 mg Oral Daily   sodium chloride flush  10-40 mL Intracatheter Q12H   spironolactone  12.5 mg Oral Daily   tamsulosin  0.4 mg Oral Daily   thiamine  100 mg Oral Daily   traZODone  150 mg Oral QHS   warfarin  4 mg Oral Once per day on Tuesday Thursday   warfarin  6 mg Oral Once per day on Sunday Monday Wednesday Friday Saturday   Warfarin - Pharmacist Dosing Inpatient   Does not apply q1600   zinc sulfate  220 mg Oral Daily    Infusions:  ceFEPime (MAXIPIME) IV 2 g (08/11/23 0914)   DAPTOmycin (CUBICIN) 800 mg in sodium chloride 0.9 % IVPB Stopped (08/10/23 1434)    PRN Medications: acetaminophen, fluticasone, oxyCODONE, sodium chloride flush, traMADol   Patient Profile  62 y.o. 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 Now s/p Heartmate 3 LVAD. Now admitted w/ suspected DL fracture after multiple falls and recurrent DL infection.  Assessment/Plan:   1.  LVAD Driveline infection: MRSA driveline infection, admitted in 6/23 and again in 7/23-8/23 with multiple trips to the OR for debridement.  Cultures have grown acinetobacter and MRSA. He was readmitted with driveline infection in 2/24, completed course of daptomycin for MRSA. Now readmitted w/ suspected DL fracture and DL infection. P/w "driveline communication fault" error message on LVAD.LVAD coordinator replaced modular cable and controller with resolution of driveline communication fault signal. CT C/A/P c/w prior imaging w/ similar appearance of mild circumferential thickening of the fat plane surrounding the drive line in the upper abdomen extending from the pump to the skin which may be chronic or represent an infectious process. No drainable fluid collection.  -Initial wound Cx + for Pseudomonas Aeruginosa  (pan-sensitive) and S. aureus. Now on Dapto, Cefepime and minocycline. BC x 2 NG. Taken to OR 7/31 for wound debridement + application of wound vac. Returned to OR 8/7 for wound wash out/debridement - Continue abx per ID. Appreciate their assistance. ID recommends 4 weeks of IV antibiotics. He is willing to stay in the hospital and complete IV antibiotics on 08/14/2023.  - Remains on IV abx. AF.   2. Suspected DL fracture with multiple recent falls - Has had multiple falls during the last 2 months (prior to admit), one direct trauma to mid sternum (fell on cinder block) - CT chest/abd/pelvis per above  - CXR w/ LVAD partially imaged. The associated lead noted to be intact w/ no fracture or kink  -  LVAD coordinators changed modular cable and controller with resolution  - stable  3.  Chronic systolic CHF s/p LVAD: Echo 10/21 with EF 20-25%, mildly decreased RV function. LHC/RHC in 12/21 with patent grafts, low output. Suspect mixed ischemic/nonischemic cardiomyopathy (prior heavy ETOH and drugs as well as CAD). Admitted with cardiogenic shock in 12/21, had placement of Impella 5.5 initially, now s/p Heartmate 3 LVAD on 11/17/20.  Ramp echo 2/23 with speed decreased to 5500 rpm. Speed now back to 5600 rpm.  LVAD parameters stable.  - Volume stable - Continue current Toprol XL 25 mg daily.  - Did not tolerate Entresto in the past d/t AKI and low MAPs.  - MAPs iimproved with increase in losartan to 100 daily. Can use entresto as needed   - INR 2.0, LDH stable at 159 Discussed dosing with PharmD personally. - VAD interrogated personally. Parameters stable.   4. CKD stage 3: Prior CTA abdomen/pelvis with high-grade stenosis of proximal right renal artery.  SCr stable  5.  CAD: S/p CABG 12/20.  LHC pre-VAD with patent grafts, no target for intervention. - Continue Crestor 20 mg daily.  - No s/s angina  6.  Atrial fibrillation: Paroxysmal.  S/p Maze and LA appendage clip with CABG in 12/20.  Now in  NSR.  - Continue Toprol XL 25 mg daily.  - Off amiodarone with hyperthyroidism. - In NSR  7. Type 2 diabetes: improved control, Hgb A1c down from 12.1>>6.3  - continue SSI   8. 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 He says last use of methamphetamine was > 2 months ago. - UDS this admit negative (however collected > 48 hrs post admit)  - Declines substance use resources  9. 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  Followed by endocrinology. He is now off amiodarone. He had been on methimazole but has not been taking recently. TSH was high on admit and free T4 normal  - Will NOT restart methimazole   10. Anemia (chronic blood loss):  CBC has been stable.    11. COPD: Prior smoker.  - Continue Trelegy.   12. PVCs/NSVT:  - Now off amiodarone.   13.  Depression: Has turned to substance abuse to cope.   - Has been referred to OP psych in [place]   14.  Gout: Continue allopurinol, he has prn colchicine.   15.  HTN:  - MAPs improved. Now ~ 90 on increased dose losartan  16. Agitation: Klonopin increased, agitation improved  17. Incisional hernia: Large abdominal wall hernia in close proximity to driveline. Cannot tolerate wearing abdominal binder.  Has chronic discomfort from the hernia and the driveline.  It is not incarcerated.  - Continue oxycodone. Alternating with Tramadol. - No good option for hernia repair given driveline proximity and size of defect.  - No change  18. Constipation - Continue colace and miralax.   I reviewed the LVAD parameters from today, and compared the results to the patient's prior recorded data.  No programming changes were made.  The LVAD is functioning within specified parameters.  The patient performs LVAD self-test daily.  LVAD interrogation was negative for any significant power changes, alarms or PI events/speed drops.  LVAD equipment check completed and is in good working order.  Back-up equipment present.    LVAD education done on emergency procedures and precautions and reviewed exit site care.  Length of Stay: 40  Arvilla Meres, MD 08/11/2023, 9:58 AM  VAD Team --- VAD ISSUES ONLY--- Pager [phone] (7am -  7am)  Advanced Heart Failure Team  Pager [phone] (M-F; 7a - 5p)  Please contact [facility] for night-coverage after hours (5p -7a ) and

## 2023-08-11 NOTE — Progress Notes (Signed)
Patient refused all of this bed time medications. This RN advised the patient to take his medications but the patient refused and said he does not want any of his medications and he is going home tomorrow. This RN offered pain medication if the patient was hurting but the patient refused it as well.

## 2023-08-12 DIAGNOSIS — T827XXA Infection and inflammatory reaction due to other cardiac and vascular devices, implants and grafts, initial encounter: Secondary | ICD-10-CM | POA: Diagnosis not present

## 2023-08-12 LAB — BASIC METABOLIC PANEL
Anion gap: 11 (ref 5–15)
BUN: 23 mg/dL (ref 8–23)
CO2: 28 mmol/L (ref 22–32)
Calcium: 9.6 mg/dL (ref 8.9–10.3)
Chloride: 97 mmol/L — ABNORMAL LOW (ref 98–111)
Creatinine, Ser: 1.04 mg/dL (ref 0.61–1.24)
GFR, Estimated: >60 mL/min (ref >60)
Glucose, Bld: 112 mg/dL — ABNORMAL HIGH (ref 70–99)
Potassium: 4.3 mmol/L (ref 3.5–5.1)
Sodium: 136 mmol/L (ref 135–145)

## 2023-08-12 LAB — CBC
HCT: 35.6 % — ABNORMAL LOW (ref 39.0–52.0)
Hemoglobin: 11.5 g/dL — ABNORMAL LOW (ref 13.0–17.0)
MCH: 26.6 pg (ref 26.0–34.0)
MCHC: 32.3 g/dL (ref 30.0–36.0)
MCV: 82.4 fL (ref 80.0–100.0)
Platelets: 287 10*3/uL (ref 150–400)
RBC: 4.32 MIL/uL (ref 4.22–5.81)
RDW: 13.9 % (ref 11.5–15.5)
WBC: 8.3 10*3/uL (ref 4.0–10.5)
nRBC: 0 % (ref 0.0–0.2)

## 2023-08-12 LAB — PROTIME-INR
INR: 1.7 — ABNORMAL HIGH (ref 0.8–1.2)
Prothrombin Time: 20.5 s — ABNORMAL HIGH (ref 11.4–15.2)

## 2023-08-12 LAB — GLUCOSE, CAPILLARY
Glucose-Capillary: 124 mg/dL — ABNORMAL HIGH (ref 70–99)
Glucose-Capillary: 106 mg/dL — ABNORMAL HIGH (ref 70–99)
Glucose-Capillary: 139 mg/dL — ABNORMAL HIGH (ref 70–99)
Glucose-Capillary: 183 mg/dL — ABNORMAL HIGH (ref 70–99)

## 2023-08-12 LAB — LACTATE DEHYDROGENASE: LDH: 175 U/L (ref 98–192)

## 2023-08-12 LAB — MAGNESIUM: Magnesium: 2 mg/dL (ref 1.7–2.4)

## 2023-08-12 LAB — CK: Total CK: 61 U/L (ref 49–397)

## 2023-08-12 MED ORDER — WARFARIN SODIUM 3 MG PO TABS
6.0000 mg | ORAL_TABLET | Freq: Every day | ORAL | Status: DC
  Administered 2023-08-12 – 2023-08-14 (×3): 6 mg via ORAL
  Filled 2023-08-12 (×3): qty 2

## 2023-08-12 MED ORDER — GABAPENTIN 300 MG PO CAPS
300.0000 mg | ORAL_CAPSULE | Freq: Three times a day (TID) | ORAL | Status: DC
  Administered 2023-08-12 – 2023-08-15 (×10): 300 mg via ORAL
  Filled 2023-08-12 (×10): qty 1

## 2023-08-12 NOTE — Progress Notes (Signed)
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 Started on IV antibiotics.   Patient presented to VAD clinic 7/29 for driveline communication fault- stating that he has had "alarms every couple hrs from his VAD for a couple months." Pt has no showed and cancelled all appointments in the past 2 months. Pt also had green/blue drainage visible through his driveline bandage. (Dressing change and wound cx obtained). Pt states that he has experienced dizziness associated with a fall multiple times over the past couple months but no signs of bleeding. Controller and modular cable exchanged. No further communication fault alarms noted.   CT Chest/Abd/Pelvis 7/29 1. Similar appearance of mild circumferential thickening of the fat plane surrounding the drive line in the upper abdomen extending from the pump to the skin which may be chronic or represent an infectious process. No drainable fluid collection.  Pt had drive line debridement in OR with placement of instillation wound vac on 07/09/23 with Dr Donata Clay. This was change to wet/dry on Friday 8/2 due to ongoing alarms related to the pts hernia.   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 Currently on IV Cefepime Q 8 hrs and IV Daptomycin 800 mg daily per ID team. IV antibiotic end date 9/5 per ID team. Pt is refusing to go a facility for IV antibiotics. ID team to determine PO antibiotic regimen at discharge.   Pt lying in bed on my arrival. He is tearful this morning, but denies complaints.   Vital signs: Temp: 97.6 HR: 77 Doppler Pressure: 84 Automatic BP: 99/72 (81) O2 Sat: 98% on RA Wt: 193.3>194.8>196.4>194.7>...>211.4>209.2>206.8>208.4>211.6>208.3>208.8>210.7>203.9>198.8>209 lbs  LVAD interrogation reveals:  Speed: 5600 Flow: 4.6 Power: 4.1 w PI: 4.2 Alarms: none Events: 100+ Hct: 31  Fixed speed:  5600 Low  speed limit:  5300  Drive Line: Existing VAD dressing removed and site care performed using sterile technique. Wound bed cleansed with VASHE solution and sterile 4x4 gauze through light manual debridement. Skin surrounding wound bed cleaned with Chlora prep applicators x 2, allowed to dry. VASHE moistened 2 x 2s  placed in wound bed, covered with several dry 4 x 4s. Wound tunnels approx 2 cm.  Drive line unincorporated. Small amount of thick yellow/serosanguinous drainage noted. No redness, tenderness, foul odor or rash noted.  Drive line anchor secured.   Continue daily wet to dry dressing changes using VASHE solution per bedside RN or VAD coordinator. Next dressing change due 08/12/23 by bedside nurse.       Labs:  LDH trend: 121>133>130>138>172>175>165>158>169>155>170>167>173>161>163>156>165>168>158>149>153>167>185>205>>153>159  INR trend: 1.0>1.1>1.2>1.4>1.3>1.4>1.5>1.4>1.8>1.9>1.7>1.8>1.9>2.1>2.2>1.2>2.1>2.6>2.2>2.1>2.1>2>2.1>2.0  Anticoagulation Plan: -INR Goal: 2.0 - 2.5 -ASA Dose: none  ICD: N/A  Infection:  07/07/23>> blood cultures>> no growth 5 days; final  07/07/23>> wound cx>> few pseudomonas aeruginosa and MRSA; final  07/09/23>> OR driveline cx>> Few pseudomonas aeruginosa and METHICILLIN RESISTANT STAPHYLOCOCCUS AUREUS 07/09/23>> blood cultures>>no growth x 5 days; final  Drips:    Plan/Recommendations:  Page VAD coordinator with equipment issues or driveline problems Daily drive line wet to dry dressing changes with VASHE solution.   Alyce Pagan RN VAD Coordinator  Office: [phone]  24/7 Pager: [phone]

## 2023-08-12 NOTE — Plan of Care (Signed)
  Problem: Education: Goal: Patient will understand all VAD equipment and how it functions Outcome: Progressing Goal: Patient will be able to verbalize current INR target range and antiplatelet therapy for discharge home Outcome: Progressing   Problem: Cardiac: Goal: LVAD will function as expected and patient will experience no clinical alarms Outcome: Progressing   Problem: Education: Goal: Knowledge of General Education information will improve Description: Including pain rating scale, medication(s)/side effects and non-pharmacologic comfort measures Outcome: Progressing   Problem: Health Behavior/Discharge Planning: Goal: Ability to manage health-related needs will improve Outcome: Progressing   Problem: Clinical Measurements: Goal: Diagnostic test results will improve Outcome: Progressing Goal: Respiratory complications will improve Outcome: Progressing Goal: Cardiovascular complication will be avoided Outcome: Progressing   Problem: Activity: Goal: Risk for activity intolerance will decrease Outcome: Progressing   Problem: Nutrition: Goal: Adequate nutrition will be maintained Outcome: Progressing   Problem: Coping: Goal: Level of anxiety will decrease Outcome: Not Progressing   Problem: Elimination: Goal: Will not experience complications related to bowel motility Outcome: Progressing Goal: Will not experience complications related to urinary retention Outcome: Progressing   Problem: Pain Managment: Goal: General experience of comfort will improve Outcome: Progressing   Problem: Safety: Goal: Ability to remain free from injury will improve Outcome: Progressing

## 2023-08-12 NOTE — Progress Notes (Signed)
Pharmacy Antibiotic Note  Login Kevin Underwood. is a 62 y.o. male admitted on 07/07/2023 with  driveline infection . Pharmacy has been consulted for daptomycin and cefepime dosing. Pt also continued on PTA minocycline for hx acinetobacter DLI.  Cr stable 1 and CK 60s stable. Wound cultures growing Pseudomonas and MRSA, blood cultures negative. Planning IV ABX x4 weeks total- ends 9/5  Plan: Daptomycin 800 mg every 24 hours Cefepime 2g IV every 8 hours Minocycline 200 mg BID F/u blood and driveline cx and continue to monitor renal function Monitor CK weekly  Height: 5\' 10"  (177.8 cm) Weight:  (patient refused) IBW/kg (Calculated) : 73  Temp (24hrs), Avg:97.9 F (36.6 C), Min:97.6 F (36.4 C), Max:98.2 F (36.8 C)  Recent Labs  Lab 08/07/23 0655 08/08/23 0320 08/09/23 0620 08/10/23 0337 08/11/23 0400 08/12/23 1139  WBC 6.8  --  6.8 6.2 7.5 8.3  CREATININE 1.26* 1.09 1.28* 1.04 1.10 1.04    Estimated Creatinine Clearance: 85.1 mL/min (by C-G formula based on SCr of 1.04 mg/dL).    Allergies  Allergen Reactions   Vancomycin Nausea And Vomiting and Other (See Comments)    Severe joint pain    Antimicrobials this admission: 7/29 Daptomycin >>  7/29 Unasyn >> 7/31 7/31 Minocycline >>  7/31 Cefepime >>   Microbiology results: 7/29 BCx: negative 7/29 Driveline Wound Cx: pseudomonas/MRSA  7/31 Driveline abscess cx: pseudomonas (pan-sensitive)/MRSA   Leota Sauers Pharm.D. CPP, BCPS Clinical Pharmacist [phone] 08/12/2023 1:40 PM   Please check AMION for all [facility] Pharmacy numbers 08/12/2023

## 2023-08-12 NOTE — Progress Notes (Signed)
Patient refused his 3 am IV abx, and morning labs. Patient said his wife is coming to get him in the morning and to get all the discharge papers ready by then. Patient getting furious and saying he is not getting his pain meds (oxy) every 4 hours. This RN had offered his pain med tramadol at around 9 pm and oxycodone at 10 pm which he had refused. Patient also said "I am going home and get some meth which is going to help me".Marland Kitchen

## 2023-08-12 NOTE — Progress Notes (Addendum)
Patient ID: [name], male   DOB: 12/31/1960, 62 y.o.   MRN: [id number]   Advanced Heart Failure VAD Team Note  PCP-Cardiologist: Nona Dell, MD  [facility]: Dr. Shirlee Latch   Subjective:    7/29: admitted w/ suspected DL fracture and DL infection. P/w "driveline communication fault" error message on LVAD. No pump stoppages. LVAD coordinator replaced modular cable and controller with resolution of driveline communication fault signal. CT C/A/P c/w prior imaging w/ mild circumferential thickening of the fat plane surrounding the drive line in the upper abdomen extending from the pump to the skin which may be chronic or represent an infectious process. No drainable fluid collection. Wound + BCx collected. Started on empiric per ID.  7/30: Wound Cx growing Pseudomonas Aeruginosa (pan-sensitive) and S. aureus.  7/31: OR for wound debridement + application of wound vac.   8/8: Returned to OR for I&D  Remains on Daptomycin, Cefepime and minocycline. Surgical wound with pseudomonas aeruginosa and MRSA. Bcx from 7/29 and 7/31 NG.   Pt refused doses of scheduled IV abx overnight (cefepime + minocycline) as well as morning labs. Uncooperative w/ nursing staff. Upset about not getting enough pain meds. Says he wants to go home and use meth.   C/w hernia discomfort. No dyspnea, chest pain, fever or chills. Wife at bedside.   LVAD INTERROGATION:  HeartMate III LVAD:   Flow 4.2 liters/min, speed 5600 power 4.2 PI  4.8  VAD interrogated personally. Numerous PI events. Objective:    Vital Signs:   Temp:  [97.6 F (36.4 C)-98.2 F (36.8 C)] 97.6 F (36.4 C) (09/03 0716) Pulse Rate:  [70-72] 70 (09/02 2305) Resp:  [11-17] 11 (09/03 0716) BP: (89-126)/(70-101) 89/70 (09/03 0716) SpO2:  [90 %-92 %] 92 % (09/02 2305) Last BM Date : 08/11/23 Mean arterial Pressure 76  Intake/Output:   Intake/Output Summary (Last 24 hours) at 08/12/2023 0737 Last data filed at 08/11/2023 1551 Gross per 24 hour   Intake 250 ml  Output --  Net 250 ml    Physical Exam   General:  sitting up on side of bed. Upset and argumentative. No respiratory difficulty  HEENT: normal  Neck: supple. JVP not elevated.  Carotids 2+ bilat; no bruits. No lymphadenopathy or thryomegaly appreciated. Cor: LVAD hum.  Lungs: CTAB  Abdomen: + large ventral hernia, tense and tender. DL and wound dressing ok Extremities: no cyanosis, clubbing, rash. Warm and dry. No LEE  Neuro: alert & oriented x 3. No focal deficits. Moves all 4 without problem    Telemetry   NSR 70-80s (Personally reviewed)    Labs   Basic Metabolic Panel: Recent Labs  Lab 08/07/23 0655 08/08/23 0320 08/09/23 0620 08/10/23 0337 08/11/23 0400  NA 136 137 136 138 135  K 4.0 4.2 4.0 4.3 4.5  CL 102 103 102 102 99  CO2 20* 25 25 25 27   GLUCOSE 109* 130* 138* 174* 127*  BUN 25* 26* 26* 26* 25*  CREATININE 1.26* 1.09 1.28* 1.04 1.10  CALCIUM 9.1 8.9 8.9 8.8* 9.2  MG 2.1 1.9 2.2 1.9 2.1    Liver Function Tests: No results for input(s): "AST", "ALT", "ALKPHOS", "BILITOT", "PROT", "ALBUMIN" in the last 168 hours.  CBC: Recent Labs  Lab 08/06/23 0706 08/07/23 0655 08/09/23 0620 08/10/23 0337 08/11/23 0400  WBC 6.0 6.8 6.8 6.2 7.5  HGB 10.6* 10.5* 10.1* 9.2* 9.9*  HCT 33.4* 31.9* 31.2* 28.7* 31.1*  MCV 84.3 82.4 84.6 86.2 84.5  PLT 269 268 259 227  237    INR: Recent Labs  Lab 08/07/23 0655 08/08/23 0320 08/09/23 0620 08/10/23 0337 08/11/23 0400  INR 2.0* 2.1* 1.9* 2.1* 2.0*    Other results:    Imaging   No results found.   Medications:     Scheduled Medications:  allopurinol  100 mg Oral Daily   amLODipine  10 mg Oral Daily   clonazepam  0.5 mg Oral BID   docusate sodium  100 mg Oral BID   fluticasone furoate-vilanterol  1 puff Inhalation Daily   And   umeclidinium bromide  1 puff Inhalation Daily   gabapentin  300 mg Oral BID   insulin aspart  0-20 Units Subcutaneous TID WC   insulin aspart  0-5  Units Subcutaneous QHS   insulin glargine-yfgn  12 Units Subcutaneous Daily   losartan  50 mg Oral Daily   metoprolol succinate  25 mg Oral Daily   minocycline  200 mg Oral BID   mirtazapine  15 mg Oral QHS   pantoprazole  40 mg Oral Daily   polyethylene glycol  17 g Oral Daily   sertraline  50 mg Oral Daily   sodium chloride flush  10-40 mL Intracatheter Q12H   spironolactone  12.5 mg Oral Daily   tamsulosin  0.4 mg Oral Daily   thiamine  100 mg Oral Daily   traZODone  150 mg Oral QHS   warfarin  4 mg Oral Once per day on Tuesday Thursday   warfarin  6 mg Oral Once per day on Sunday Monday Wednesday Friday Saturday   Warfarin - Pharmacist Dosing Inpatient   Does not apply q1600   zinc sulfate  220 mg Oral Daily    Infusions:  ceFEPime (MAXIPIME) IV 2 g (08/11/23 1804)   DAPTOmycin (CUBICIN) 800 mg in sodium chloride 0.9 % IVPB 800 mg (08/11/23 1417)    PRN Medications: acetaminophen, fluticasone, oxyCODONE, sodium chloride flush, traMADol   Patient Profile  62 y.o. 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 Now s/p Heartmate 3 LVAD. Now admitted w/ suspected DL fracture after multiple falls and recurrent DL infection.  Assessment/Plan:   1.  LVAD Driveline infection: MRSA driveline infection, admitted in 6/23 and again in 7/23-8/23 with multiple trips to the OR for debridement.  Cultures have grown acinetobacter and MRSA. He was readmitted with driveline infection in 2/24, completed course of daptomycin for MRSA. Now readmitted w/ suspected DL fracture and DL infection. P/w "driveline communication fault" error message on LVAD.LVAD coordinator replaced modular cable and controller with resolution of driveline communication fault signal. CT C/A/P c/w prior imaging w/ similar appearance  of mild circumferential thickening of the fat plane surrounding the drive line in the upper abdomen extending from the pump to the skin which may be chronic or represent an infectious process. No drainable fluid collection.  -Initial wound Cx + for Pseudomonas Aeruginosa (pan-sensitive) and S. aureus. Now on Dapto, Cefepime and minocycline. BC x 2 NG. Taken to OR 7/31 for wound debridement + application of wound vac. Returned to OR 8/7 for wound wash out/debridement - Continue abx per ID. Appreciate their assistance. ID recommends 4 weeks of IV antibiotics. He is willing to stay in the hospital and complete IV antibiotics on 08/14/2023 as long as we can keep his pain under control  - Remains  on IV abx. AF.   2. Suspected DL fracture with multiple recent falls - Has had multiple falls during the last 2 months (prior to admit), one direct trauma to mid sternum (fell on cinder block) - CT chest/abd/pelvis per above  - CXR w/ LVAD partially imaged. The associated lead noted to be intact w/ no fracture or kink  - LVAD coordinators changed modular cable and controller with resolution  - stable  3.  Chronic systolic CHF s/p LVAD: Echo 10/21 with EF 20-25%, mildly decreased RV function. LHC/RHC in 12/21 with patent grafts, low output. Suspect mixed ischemic/nonischemic cardiomyopathy (prior heavy ETOH and drugs as well as CAD). Admitted with cardiogenic shock in 12/21, had placement of Impella 5.5 initially, now s/p Heartmate 3 LVAD on 11/17/20.  Ramp echo 2/23 with speed decreased to 5500 rpm. Speed now back to 5600 rpm.  LVAD parameters stable.  - Volume stable - Continue current Toprol XL 25 mg daily.  - Did not tolerate Entresto in the past d/t AKI and low MAPs.  - MAPs iimproved with increase in losartan to 100 daily. Can use entresto as needed   - INR 2.0 and LDH stable at 159 yesterday. Pt refusing labs today  - VAD interrogated personally. Parameters stable.   4. CKD stage 3: Prior CTA  abdomen/pelvis with high-grade stenosis of proximal right renal artery.  SCr has been stable through admission, pt refused labs today   5.  CAD: S/p CABG 12/20.  LHC pre-VAD with patent grafts, no target for intervention. - Continue Crestor 20 mg daily.  - No s/s angina  6.  Atrial fibrillation: Paroxysmal.  S/p Maze and LA appendage clip with CABG in 12/20.  Now in NSR.  - Continue Toprol XL 25 mg daily.  - Off amiodarone with hyperthyroidism. - In NSR  7. Type 2 diabetes: improved control, Hgb A1c down from 12.1>>6.3  - continue SSI   8. 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 He says last use of methamphetamine was > 2 months ago. - UDS this admit negative (however collected > 48 hrs post admit)  - Declines substance use resources  9. 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  Followed by endocrinology. He is now off amiodarone. He had been on methimazole but has not been taking recently. TSH was high on admit and free T4 normal  - Will NOT restart methimazole   10. Anemia (chronic blood loss):  CBC has been stable.    11. COPD: Prior smoker.  - Continue Trelegy.   12. PVCs/NSVT:  - Now off amiodarone.   13.  Depression: Has turned to substance abuse to cope.   - Has been referred to OP psych in    14.  Gout: Continue allopurinol, he has prn colchicine.   15.  HTN:  - MAPs improved. Now ~ 76 on increased dose losartan  16. Agitation: Klonopin increased, agitation improved  17. Incisional hernia: Large abdominal wall hernia in close proximity to driveline. Cannot tolerate wearing abdominal binder.  Has chronic discomfort from the hernia and the driveline.  It is not incarcerated.  - Continue oxycodone. Alternating with Tramadol. - No good option for hernia repair given driveline proximity and size of defect.  - No change  18. Constipation - Continue colace and miralax.   I reviewed the LVAD parameters from today, and compared the  results to the patient's prior recorded data.  No programming changes were made.  The LVAD is functioning within specified parameters.  The patient performs LVAD self-test daily.  LVAD interrogation was negative for any significant power changes, alarms or PI events/speed drops.  LVAD equipment check completed and is in good working order.  Back-up equipment present.   LVAD education done on emergency procedures and precautions and reviewed exit site care.  Length of Stay: [address] PA-C 08/12/2023, 7:37 AM  VAD Team --- VAD ISSUES ONLY--- Pager [phone] (7am - 7am)  Advanced Heart Failure Team  Pager [phone] (M-F; 7a - 5p)  Please contact [facility] for night-coverage after hours (5p -7a ) and   Patient seen and examined with the above-signed Advanced Practice Provider and/or Housestaff. I personally reviewed laboratory data, imaging studies and relevant notes. I independently examined the patient and formulated the important aspects of the plan. I have edited the note to reflect any of my changes or salient points. I have personally discussed the plan with the patient and/or family.  Tolerating IV abx well. AF. Still c/o ab hernia. INR 2.0 MAPs ok  General:  NAD.  HEENT: normal  Neck: supple. JVP not elevated.  Carotids 2+ bilat; no bruits. No lymphadenopathy or thryomegaly appreciated. Cor: LVAD hum.  Lungs: Clear. Abdomen: obese soft,large hernia. DL site and dressing ok  Extremities: no cyanosis, clubbing, rash. Warm no edema  Neuro: alert & oriented x 3. No focal deficits. Moves all 4 without problem   Continue IV abx. Continue dressing changes.   INR ok. Discussed dosing with PharmD personally.  VAD interrogated personally. Parameters stable.  Pain well controlled. Encouraged to wear ab binder.   Arvilla Meres, MD  8:53 AM

## 2023-08-12 NOTE — TOC Progression Note (Signed)
Transition of Care (TOC) - Progression Note    Patient Details  Name: Kevin Underwood. MRN: [id number] Date of Birth: January 12, 1961  Transition of [facility]) CM/SW Contact  Nicanor Bake Phone Number: [phone] 08/12/2023, 11:59 AM  Clinical Narrative:  HF CSW called the pts wife to discuss the pts choice of wanting to dc with HH/nurse aide services. CSW wanted to confirm with the wife that she still had the list that was previously provided and discuss if the family had mad a decision. CSW left a VM and will follow up with pt and wife. TOC will continue following.      Expected Discharge Plan: Home w Home Health Services Barriers to Discharge: Continued Medical Work up  Expected Discharge Plan and Services   Discharge Planning Services: CM Consult Post Acute Care Choice: Home Health Living arrangements for the past 2 months: Single Family Home                           HH Arranged: RN, PT           Social Determinants of Health (SDOH) Interventions SDOH Screenings   Food Insecurity: No Food Insecurity (07/08/2023)  Housing: Low Risk  (07/08/2023)  Transportation Needs: No Transportation Needs (07/08/2023)  Utilities: Not At Risk (07/08/2023)  Alcohol Screen: Low Risk  (11/10/2020)  Depression (PHQ2-9): Low Risk  (10/16/2022)  Financial Resource Strain: Medium Risk (05/17/2022)  Physical Activity: Inactive (11/10/2020)  Tobacco Use: Medium Risk (07/09/2023)    Readmission Risk Interventions    07/01/2022    9:05 AM 11/28/2020   11:57 AM  Readmission Risk Prevention Plan  Transportation Screening Complete Complete  Medication Review Oceanographer) Complete Complete  PCP or Specialist appointment within 3-5 days of discharge Complete   HRI or Home Care Consult Complete Complete  SW Recovery Care/Counseling Consult Complete Complete  Palliative Care Screening Not Applicable Not Applicable  Skilled Nursing Facility Not Applicable Not Applicable

## 2023-08-12 NOTE — Plan of Care (Signed)
  Problem: Education: Goal: Patient will understand all VAD equipment and how it functions Outcome: Progressing Goal: Patient will be able to verbalize current INR target range and antiplatelet therapy for discharge home Outcome: Progressing   Problem: Cardiac: Goal: LVAD will function as expected and patient will experience no clinical alarms Outcome: Progressing   Problem: Education: Goal: Knowledge of General Education information will improve Description: Including pain rating scale, medication(s)/side effects and non-pharmacologic comfort measures Outcome: Progressing   Problem: Health Behavior/Discharge Planning: Goal: Ability to manage health-related needs will improve Outcome: Progressing   Problem: Clinical Measurements: Goal: Ability to maintain clinical measurements within normal limits will improve Outcome: Progressing Goal: Will remain free from infection Outcome: Progressing Goal: Diagnostic test results will improve Outcome: Progressing Goal: Respiratory complications will improve Outcome: Progressing Goal: Cardiovascular complication will be avoided Outcome: Progressing   Problem: Activity: Goal: Risk for activity intolerance will decrease Outcome: Progressing   Problem: Nutrition: Goal: Adequate nutrition will be maintained Outcome: Progressing   Problem: Coping: Goal: Level of anxiety will decrease Outcome: Progressing   Problem: Elimination: Goal: Will not experience complications related to bowel motility Outcome: Progressing Goal: Will not experience complications related to urinary retention Outcome: Progressing   Problem: Pain Managment: Goal: General experience of comfort will improve Outcome: Progressing   Problem: Safety: Goal: Ability to remain free from injury will improve Outcome: Progressing   Problem: Skin Integrity: Goal: Risk for impaired skin integrity will decrease Outcome: Progressing   Problem: Education: Goal: Ability  to describe self-care measures that may prevent or decrease complications (Diabetes Survival Skills Education) will improve Outcome: Progressing Goal: Individualized Educational Video(s) Outcome: Progressing   Problem: Coping: Goal: Ability to adjust to condition or change in health will improve Outcome: Progressing   Problem: Fluid Volume: Goal: Ability to maintain a balanced intake and output will improve Outcome: Progressing   Problem: Health Behavior/Discharge Planning: Goal: Ability to identify and utilize available resources and services will improve Outcome: Progressing Goal: Ability to manage health-related needs will improve Outcome: Progressing   Problem: Metabolic: Goal: Ability to maintain appropriate glucose levels will improve Outcome: Progressing   Problem: Nutritional: Goal: Maintenance of adequate nutrition will improve Outcome: Progressing Goal: Progress toward achieving an optimal weight will improve Outcome: Progressing   Problem: Skin Integrity: Goal: Risk for impaired skin integrity will decrease Outcome: Progressing   Problem: Tissue Perfusion: Goal: Adequacy of tissue perfusion will improve Outcome: Progressing   

## 2023-08-12 NOTE — Progress Notes (Signed)
ANTICOAGULATION CONSULT NOTE  Pharmacy Consult for warfarin  Indication:  LVAD  Allergies  Allergen Reactions   Vancomycin Nausea And Vomiting and Other (See Comments)    Severe joint pain    Patient Measurements: Height: 5\' 10"  (177.8 cm) Weight:  (patient refused) IBW/kg (Calculated) : 73  Vital Signs: Temp: 97.6 F (36.4 C) (09/03 1100) Temp Source: Oral (09/03 1100) BP: 113/95 (09/03 1100) Pulse Rate: 69 (09/03 0942)  Labs: Recent Labs    08/10/23 0337 08/11/23 0400 08/12/23 1139  HGB 9.2* 9.9* 11.5*  HCT 28.7* 31.1* 35.6*  PLT 227 237 287  LABPROT 24.1* 22.7* 20.5*  INR 2.1* 2.0* 1.7*  CREATININE 1.04 1.10 1.04  CKTOTAL  --   --  61    Estimated Creatinine Clearance: 85.1 mL/min (by C-G formula based on SCr of 1.04 mg/dL).   Medical History: Past Medical History:  Diagnosis Date   "    Arthritis    CAD (coronary artery disease)    a. s/p CABG in 11/2019 with LIMA-LAD, SVG-OM1, SVG-PDA and SVG-D1   CHF (congestive heart failure) (HCC)    a. EF < 20% by echo in 11/2019   Essential hypertension    PAF (paroxysmal atrial fibrillation) (HCC)    Type 2 diabetes mellitus (HCC)     Medications:  Scheduled:   allopurinol  100 mg Oral Daily   amLODipine  10 mg Oral Daily   clonazepam  0.5 mg Oral BID   docusate sodium  100 mg Oral BID   fluticasone furoate-vilanterol  1 puff Inhalation Daily   And   umeclidinium bromide  1 puff Inhalation Daily   gabapentin  300 mg Oral TID   insulin aspart  0-20 Units Subcutaneous TID WC   insulin aspart  0-5 Units Subcutaneous QHS   insulin glargine-yfgn  12 Units Subcutaneous Daily   losartan  50 mg Oral Daily   metoprolol succinate  25 mg Oral Daily   minocycline  200 mg Oral BID   mirtazapine  15 mg Oral QHS   pantoprazole  40 mg Oral Daily   polyethylene glycol  17 g Oral Daily   sertraline  50 mg Oral Daily   sodium chloride flush  10-40 mL Intracatheter Q12H   spironolactone  12.5 mg Oral Daily    tamsulosin  0.4 mg Oral Daily   thiamine  100 mg Oral Daily   traZODone  150 mg Oral QHS   warfarin  4 mg Oral Once per day on Tuesday Thursday   warfarin  6 mg Oral Once per day on Sunday Monday Wednesday Friday Saturday   Warfarin - Pharmacist Dosing Inpatient   Does not apply q1600   zinc sulfate  220 mg Oral Daily    Assessment: 62 yom who presented to clinic with concern for driveline fracture and infection. Last [facility] appointment was on 04/21/2023 - INR was subtherapeutic at 1.4 and regimen at that time was 6 mg daily except 4 mg Tues/Thurs (LD 7/25). Given low INR on admit, started fixed rate heparin 500 units/hr - now stopped.   INR fell from 2> 1.7 when warfarin dose reduced from 6mg  every day to pta regimen 4mg  TT and 6mg  aod.  Hgb 9's, platelets stable.  No bleeding.  Goal of Therapy:  INR 2-2.5 Monitor platelets by anticoagulation protocol: Yes   Plan:  Resume warfarin 6mg  daily Monitor daily INR, CBC, LDH, and for s/sx of bleeding     Leota Sauers Pharm.D. CPP, BCPS Clinical Pharmacist  [phone] 08/12/2023 1:39 PM    Please check AMION for all [facility] Pharmacy phone numbers After 10:00 PM, call Main Pharmacy [phone]

## 2023-08-13 ENCOUNTER — Other Ambulatory Visit: Payer: Self-pay

## 2023-08-13 DIAGNOSIS — T827XXA Infection and inflammatory reaction due to other cardiac and vascular devices, implants and grafts, initial encounter: Secondary | ICD-10-CM | POA: Diagnosis not present

## 2023-08-13 LAB — GLUCOSE, CAPILLARY
Glucose-Capillary: 165 mg/dL — ABNORMAL HIGH (ref 70–99)
Glucose-Capillary: 110 mg/dL — ABNORMAL HIGH (ref 70–99)
Glucose-Capillary: 162 mg/dL — ABNORMAL HIGH (ref 70–99)
Glucose-Capillary: 125 mg/dL — ABNORMAL HIGH (ref 70–99)

## 2023-08-13 LAB — MAGNESIUM: Magnesium: 2 mg/dL (ref 1.7–2.4)

## 2023-08-13 LAB — BASIC METABOLIC PANEL
Anion gap: 12 (ref 5–15)
BUN: 32 mg/dL — ABNORMAL HIGH (ref 8–23)
CO2: 25 mmol/L (ref 22–32)
Calcium: 9.1 mg/dL (ref 8.9–10.3)
Chloride: 100 mmol/L (ref 98–111)
Creatinine, Ser: 1.24 mg/dL (ref 0.61–1.24)
GFR, Estimated: >60 mL/min (ref >60)
Glucose, Bld: 159 mg/dL — ABNORMAL HIGH (ref 70–99)
Potassium: 4.1 mmol/L (ref 3.5–5.1)
Sodium: 137 mmol/L (ref 135–145)

## 2023-08-13 LAB — PROTIME-INR
INR: 1.9 — ABNORMAL HIGH (ref 0.8–1.2)
Prothrombin Time: 21.6 s — ABNORMAL HIGH (ref 11.4–15.2)

## 2023-08-13 LAB — LACTATE DEHYDROGENASE: LDH: 170 U/L (ref 98–192)

## 2023-08-13 MED ORDER — INFLUENZA VIRUS VACC SPLIT PF (FLUZONE) 0.5 ML IM SUSY
0.5000 mL | PREFILLED_SYRINGE | INTRAMUSCULAR | Status: AC
  Administered 2023-08-14: 0.5 mL via INTRAMUSCULAR
  Filled 2023-08-13: qty 0.5

## 2023-08-13 MED ORDER — CIPROFLOXACIN HCL 750 MG PO TABS
750.0000 mg | ORAL_TABLET | Freq: Two times a day (BID) | ORAL | Status: DC
  Administered 2023-08-15: 750 mg via ORAL
  Filled 2023-08-13 (×2): qty 1

## 2023-08-13 NOTE — Progress Notes (Addendum)
Patient ID: Kevin Rigg., male   DOB: June 24, 1961, 62 y.o.   MRN: [id number]   Advanced Heart Failure VAD Team Note  PCP-Cardiologist: Nona Dell, MD  [facility]: Dr. Shirlee Latch   Subjective:    7/29: admitted w/ suspected DL fracture and DL infection. P/w "driveline communication fault" error message on LVAD. No pump stoppages. LVAD coordinator replaced modular cable and controller with resolution of driveline communication fault signal. CT C/A/P c/w prior imaging w/ mild circumferential thickening of the fat plane surrounding the drive line in the upper abdomen extending from the pump to the skin which may be chronic or represent an infectious process. No drainable fluid collection. Wound + BCx collected. Started on empiric per ID.  7/30: Wound Cx growing Pseudomonas Aeruginosa (pan-sensitive) and S. aureus.  7/31: OR for wound debridement + application of wound vac.   8/8: Returned to OR for I&D  Remains on Daptomycin, Cefepime and minocycline. Surgical wound with pseudomonas aeruginosa and MRSA. Bcx from 7/29 and 7/31 NG.   Denies fever and chills. Main complaint continues to be discomfort from his hernia, though pain better today w/ oxycodone.   INR 1.9    LVAD INTERROGATION:  HeartMate III LVAD:   Flow 4.5 liters/min, speed 5450 power 4.3 PI  4.1  VAD interrogated personally.  Objective:    Vital Signs:   Temp:  [97.6 F (36.4 C)-98.1 F (36.7 C)] 97.6 F (36.4 C) (09/03 2317) Pulse Rate:  [69-74] 74 (09/03 1955) Resp:  [12-18] 14 (09/03 2317) BP: (87-114)/(63-95) 87/74 (09/03 2317) SpO2:  [97 %-98 %] 97 % (09/03 2317) Weight:  [93.7 kg] 93.7 kg (09/04 0505) Last BM Date : 08/12/23 Mean arterial Pressure 80  Intake/Output:   Intake/Output Summary (Last 24 hours) at 08/13/2023 0755 Last data filed at 08/13/2023 0641 Gross per 24 hour  Intake 1312 ml  Output --  Net 1312 ml    Physical Exam   General:  well appearing, sitting up in chair. No distress.  HEENT: normal   Neck: supple. No JVD.  Carotids 2+ bilat; no bruits. No lymphadenopathy or thryomegaly appreciated. Cor: LVAD hum.  Lungs: clear bilaterally  Abdomen: + large ventral hernia, tense and mildly tender. DL and wound dressing ok Extremities: no cyanosis, clubbing, rash. Warm and dry. No LEE + RUE PICC Neuro: alert & oriented x 3. No focal deficits. Moves all 4 without problem    Telemetry   NSR 70-80s (Personally reviewed)    Labs   Basic Metabolic Panel: Recent Labs  Lab 08/09/23 0620 08/10/23 0337 08/11/23 0400 08/12/23 1139 08/13/23 0425  NA 136 138 135 136 137  K 4.0 4.3 4.5 4.3 4.1  CL 102 102 99 97* 100  CO2 25 25 27 28 25   GLUCOSE 138* 174* 127* 112* 159*  BUN 26* 26* 25* 23 32*  CREATININE 1.28* 1.04 1.10 1.04 1.24  CALCIUM 8.9 8.8* 9.2 9.6 9.1  MG 2.2 1.9 2.1 2.0 2.0    Liver Function Tests: No results for input(s): "AST", "ALT", "ALKPHOS", "BILITOT", "PROT", "ALBUMIN" in the last 168 hours.  CBC: Recent Labs  Lab 08/07/23 0655 08/09/23 0620 08/10/23 0337 08/11/23 0400 08/12/23 1139  WBC 6.8 6.8 6.2 7.5 8.3  HGB 10.5* 10.1* 9.2* 9.9* 11.5*  HCT 31.9* 31.2* 28.7* 31.1* 35.6*  MCV 82.4 84.6 86.2 84.5 82.4  PLT 268 259 227 237 287    INR: Recent Labs  Lab 08/09/23 0620 08/10/23 0337 08/11/23 0400 08/12/23 1139 08/13/23 0425  INR 1.9*  2.1* 2.0* 1.7* 1.9*    Other results:    Imaging   No results found.   Medications:     Scheduled Medications:  allopurinol  100 mg Oral Daily   amLODipine  10 mg Oral Daily   clonazepam  0.5 mg Oral BID   docusate sodium  100 mg Oral BID   fluticasone furoate-vilanterol  1 puff Inhalation Daily   And   umeclidinium bromide  1 puff Inhalation Daily   gabapentin  300 mg Oral TID   insulin aspart  0-20 Units Subcutaneous TID WC   insulin aspart  0-5 Units Subcutaneous QHS   insulin glargine-yfgn  12 Units Subcutaneous Daily   losartan  50 mg Oral Daily   metoprolol succinate  25 mg Oral Daily    minocycline  200 mg Oral BID   mirtazapine  15 mg Oral QHS   pantoprazole  40 mg Oral Daily   polyethylene glycol  17 g Oral Daily   sertraline  50 mg Oral Daily   sodium chloride flush  10-40 mL Intracatheter Q12H   spironolactone  12.5 mg Oral Daily   tamsulosin  0.4 mg Oral Daily   thiamine  100 mg Oral Daily   traZODone  150 mg Oral QHS   warfarin  6 mg Oral q1600   Warfarin - Pharmacist Dosing Inpatient   Does not apply q1600   zinc sulfate  220 mg Oral Daily    Infusions:  ceFEPime (MAXIPIME) IV Stopped (08/13/23 0454)   DAPTOmycin (CUBICIN) 800 mg in sodium chloride 0.9 % IVPB Stopped (08/12/23 1546)    PRN Medications: acetaminophen, fluticasone, oxyCODONE, sodium chloride flush, traMADol   Patient Profile  62 y.o. 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 Now s/p Heartmate 3 LVAD. Now admitted w/ suspected DL fracture after multiple falls and recurrent DL infection.  Assessment/Plan:   1.  LVAD Driveline infection: MRSA driveline infection, admitted in 6/23 and again in 7/23-8/23 with multiple trips to the OR for debridement.  Cultures have grown acinetobacter and MRSA. He was readmitted with driveline infection in 2/24, completed course of daptomycin for MRSA. Now readmitted w/ suspected DL fracture and DL infection. P/w "driveline communication fault" error message on LVAD.LVAD coordinator replaced modular cable and controller with resolution of driveline communication fault signal. CT C/A/P c/w prior imaging w/ similar appearance of mild circumferential thickening of the fat plane surrounding the drive line in the upper abdomen extending from the pump to the skin which may be chronic or represent an infectious process. No drainable fluid collection.  -Initial wound Cx + for  Pseudomonas Aeruginosa (pan-sensitive) and S. aureus. Now on Dapto, Cefepime and minocycline. BC x 2 NG. Taken to OR 7/31 for wound debridement + application of wound vac. Returned to OR 8/7 for wound wash out/debridement - Continue abx per ID. Appreciate their assistance. ID recommends 4 weeks of IV antibiotics. He is willing to stay in the hospital and complete IV antibiotics on 08/14/2023  - Remains on IV abx. AF.   2. Suspected DL fracture with multiple recent falls - Has had multiple falls during the last 2 months (prior to admit), one direct trauma to mid sternum (fell on cinder block) - CT chest/abd/pelvis per above  - CXR w/ LVAD partially imaged. The associated lead noted to be  intact w/ no fracture or kink  - LVAD coordinators changed modular cable and controller with resolution  - stable  3.  Chronic systolic CHF s/p LVAD: Echo 10/21 with EF 20-25%, mildly decreased RV function. LHC/RHC in 12/21 with patent grafts, low output. Suspect mixed ischemic/nonischemic cardiomyopathy (prior heavy ETOH and drugs as well as CAD). Admitted with cardiogenic shock in 12/21, had placement of Impella 5.5 initially, now s/p Heartmate 3 LVAD on 11/17/20.  Ramp echo 2/23 with speed decreased to 5500 rpm. Speed now back to 5600 rpm.  LVAD parameters stable.  - Volume stable - Continue current Toprol XL 25 mg daily.  - Did not tolerate Entresto in the past d/t AKI and low MAPs.  - MAPs improved with increase in losartan to 100 daily. Can use entresto as needed   - INR 1.9 and LDH stable at 170  - VAD interrogated personally. Parameters stable.   4. CKD stage 3: Prior CTA abdomen/pelvis with high-grade stenosis of proximal right renal artery.  -  SCr has been stable through admission  5.  CAD: S/p CABG 12/20.  LHC pre-VAD with patent grafts, no target for intervention. - Continue Crestor 20 mg daily.  - No s/s angina  6.  Atrial fibrillation: Paroxysmal.  S/p Maze and LA appendage clip with CABG in  12/20.  Now in NSR.  - Continue Toprol XL 25 mg daily.  - Off amiodarone with hyperthyroidism. - In NSR  7. Type 2 diabetes: improved control, Hgb A1c down from 12.1>>6.3  - continue SSI   8. 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 He says last use of methamphetamine was > 2 months ago. - UDS this admit negative (however collected > 48 hrs post admit)  - Declines substance use resources  9. 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  Followed by endocrinology. He is now off amiodarone. He had been on methimazole but has not been taking recently. TSH was high on admit and free T4 normal  - Will NOT restart methimazole   10. Anemia (chronic blood loss):  CBC has been stable.    11. COPD: Prior smoker.  - Continue Trelegy.   12. PVCs/NSVT:  - Now off amiodarone.   13.  Depression: Has turned to substance abuse to cope.   - Has been referred to OP psych in [place]   14.  Gout: Continue allopurinol, he has prn colchicine.   15.  HTN:  - MAPs improved. Now 80s on increased dose losartan  16. Agitation: Klonopin increased, agitation improved  17. Incisional hernia: Large abdominal wall hernia in close proximity to driveline. Cannot tolerate wearing abdominal binder.  Has chronic discomfort from the hernia and the driveline.  It is not incarcerated.  - Continue oxycodone. Alternating with Tramadol. - No good option for hernia repair given driveline proximity and size of defect.  - No change  18. Constipation - Continue colace and miralax.   I reviewed the LVAD parameters from today, and compared the results to the patient's prior recorded data.  No programming changes were made.  The LVAD is functioning within specified parameters.  The patient performs LVAD self-test daily.  LVAD interrogation was negative for any significant power changes, alarms or PI events/speed drops.  LVAD equipment check completed and is in good working order.  Back-up equipment  present.   LVAD education done on emergency procedures and precautions and reviewed exit site care.  Length of Stay: [address] PA-C 08/13/2023, 7:55 AM  VAD Team --- VAD ISSUES ONLY--- Pager [phone] (7am - 7am)  Advanced Heart Failure Team  Pager [phone] (M-F; 7a - 5p)  Please contact [facility] for night-coverage after hours (5p -7a ) and   Patient seen and examined with the above-signed Advanced Practice Provider and/or Housestaff. I personally reviewed laboratory data, imaging studies and relevant notes. I independently examined the patient and formulated the important aspects of the plan. I have edited the note to reflect any of my changes or salient points. I have personally discussed the plan with the patient and/or family.  Remains on IV abx. No f/c. Still c/o pain from hernia.   MAPs improved. INR 1.9   General:  NAD.  HEENT: normal  Neck: supple. JVP not elevated.  Carotids 2+ bilat; no bruits. No lymphadenopathy or thryomegaly appreciated. Cor: LVAD hum.  Lungs: Clear. Abdomen: obese soft,large ventral hernia. Wound/DL dressing ok  Extremities: no cyanosis, clubbing, rash. Warm no edema  Neuro: alert & oriented x 3. No focal deficits. Moves all 4 without problem   Remains on IV abx. We have engaged with ID to determine appropriate po abx on discharge later this week.   Continue IV abx for now.   Encouraged him to wear ab binder  INR 1.9. Discussed dosing with PharmD personally.  VAD interrogated personally. Parameters stable.  Arvilla Meres, MD  9:32 AM

## 2023-08-13 NOTE — Progress Notes (Addendum)
[facility] Infectious Diseases Follow Up Note  Patient Identification: Patient Name: Kevin Underwood. MRN: [id number] Admit Date: 07/07/2023  5:10 PM Age: 62 y.o.Today's Date: 08/13/2023  Reason for Visit: LVAD infection   Principal Problem:   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 (LVAD) (HCC)   Current Antibiotics: Daptomycin, cefepime and PO minocycline   Lines/Hardwares: LVAD, atrial bandage clipping, rt arm midline   Interval Events: remains afebrile.    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 EOT 08/14/23 ( MRSA S to Daptomycin). Appears he was wanting to leave hospital prior to completion of 6 weeks and hence, planned for at least 4 weeks induction. There is also question of him reliably able to get IV abtx at home with PICC and hence was  decided to stay inpatient until IV course completed   Exam today per LVAD coordinator" Small amount of thick yellow/serosanguinous drainage noted. No redness, tenderness, foul odor or rash noted ". 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 EOT 08/28/23 Then can  continue minocycline for suppression of MRSA along with Acinetobacter and reassess need of ciprofloxacin for PsA suppression  - Monitor CBC, BMP and CPK (81) -Will need close monitoring of INR while on ciprofloxacin  -Patient has a fu appointment with Dr Thedore Mins 9/16 at 1: 45pm -ID wil so, recall if needed   Rest of the management as per the primary team. Thank you for the consult. Please page with pertinent questions or  concerns.  ______________________________________________________________________ Subjective patient seen and examined at the bedside. No complains except some pain due to his hernia   Past Medical History:  Diagnosis Date   "    Arthritis    CAD (coronary artery disease)    a. s/p CABG in 11/2019 with LIMA-LAD, SVG-OM1, SVG-PDA and SVG-D1   CHF (congestive heart failure) (HCC)    a. EF < 20% by echo in 11/2019   Essential hypertension    PAF (paroxysmal atrial fibrillation) (HCC)    Type 2 diabetes mellitus (HCC)    Past Surgical History:  Procedure Laterality Date   APPLICATION OF WOUND VAC Left 06/14/2022   Procedure: APPLICATION OF WOUND VAC;  Surgeon: Lovett Sox, MD;  [facility];  Service: Thoracic;  Laterality: Left;   APPLICATION OF WOUND VAC N/A 06/20/2022   Procedure: VAD POWERCORD TUNNEL WOUND VAC CHANGE;  Surgeon: Lovett Sox, MD;  [facility];  Service: Thoracic;  Laterality: N/A;   APPLICATION OF WOUND VAC N/A 06/27/2022   Procedure: WOUND VAC CHANGE;  Surgeon: Lovett Sox, MD;  [facility];  Service: Thoracic;  Laterality: N/A;   APPLICATION OF WOUND VAC N/A 07/08/2022   Procedure: WOUND VAC CHANGE;  Surgeon: Lovett Sox, MD;  [facility];  Service: Thoracic;  Laterality: N/A;   APPLICATION OF WOUND VAC N/A 01/28/2023   Procedure: APPLICATION OF WOUND VAC;  Surgeon: Lovett Sox, MD;  [facility];  Service: Thoracic;  Laterality: N/A;   APPLICATION OF WOUND VAC N/A 02/05/2023   Procedure: WOUND VAC CHANGE;  Surgeon: Lovett Sox, MD;  [facility];  Service: Thoracic;  Laterality: N/A;   APPLICATION OF WOUND  VAC N/A 02/12/2023   Procedure: REMOVAL OF WOUND VAC DRESSING;  Surgeon: Lovett Sox, MD;  [facility];  Service: Thoracic;  Laterality: N/A;   APPLICATION OF WOUND VAC N/A 07/09/2023   Procedure: APPLICATION OF WOUND VAC;  Surgeon: Lovett Sox, MD;  [facility];  Service: Thoracic;  Laterality: N/A;   BACK  SURGERY     CLIPPING OF ATRIAL APPENDAGE N/A 11/26/2019   Procedure: Clipping Of Atrial Appendage using AtriCure 40 Clip;  Surgeon: [name], MD;  [facility];  Service: Open Heart Surgery;  Laterality: N/A;   CORONARY ARTERY BYPASS GRAFT N/A 11/26/2019   Procedure: CORONARY ARTERY BYPASS GRAFTING (CABG) using endoscopic greater saphenous vein harvest: svc to OM; svc to Diag; svc to PD; and LIMA to LAD.;  Surgeon: [name], MD;  [facility];  Service: Open Heart Surgery;  Laterality: N/A;   FOOT SURGERY     HAND SURGERY     INSERTION OF IMPLANTABLE LEFT VENTRICULAR ASSIST DEVICE N/A 11/17/2020   Procedure: INSERTION OF IMPLANTABLE LEFT VENTRICULAR ASSIST DEVICE - HM3;  Surgeon: [name], MD;  [facility];  Service: Open Heart Surgery;  Laterality: N/A;   INTRAOPERATIVE TRANSESOPHAGEAL ECHOCARDIOGRAM  12/04/2019   Procedure: Intraoperative Transesophageal Echocardiogram;  Surgeon: [name], MD;  [facility];  Service: Open Heart Surgery;;   MAZE N/A 11/26/2019   Procedure: MAZE using Bilateral Pulmonary Vein isolation.;  Surgeon: [name], MD;  [facility];  Service: Open Heart Surgery;  Laterality: N/A;   MULTIPLE EXTRACTIONS WITH ALVEOLOPLASTY N/A 11/16/2020   Procedure: MULTIPLE EXTRACTION WITH ALVEOLOPLASTY;  Surgeon: Sharman Cheek, DMD;  [facility];  Service: Dentistry;  Laterality: N/A;   PLACEMENT OF IMPELLA LEFT VENTRICULAR ASSIST DEVICE N/A 11/24/2019   Procedure: PLACEMENT OF IMPELLA 5.5 LEFT VENTRICULAR ASSIST DEVICE;  Surgeon: [name], MD;  [facility];  Service: Open Heart Surgery;  Laterality: N/A;   PLACEMENT OF IMPELLA LEFT VENTRICULAR ASSIST DEVICE Left 11/10/2020   Procedure: PLACEMENT OF IMPELLA 5.5 LEFT VENTRICULAR ASSIST DEVICE VIA  LEFT AVILLARY ARTERY;  Surgeon: [name], MD;  [facility];  Service: Open Heart Surgery;  Laterality: Left;   REMOVAL OF IMPELLA LEFT VENTRICULAR  ASSIST DEVICE Right 12/04/2019   Procedure: REMOVAL OF IMPELLA LEFT VENTRICULAR ASSIST DEVICE, right axilla;  Surgeon: [name], MD;  Location: [facility] OR;  Service: Open Heart Surgery;  Laterality: Right;   RIGHT/LEFT HEART CATH AND CORONARY ANGIOGRAPHY N/A 11/24/2019   Procedure: RIGHT/LEFT HEART CATH AND CORONARY ANGIOGRAPHY;  Surgeon: Laurey Morale, MD;  [facility] INVASIVE CV LAB;  Service: Cardiovascular;  Laterality: N/A;   RIGHT/LEFT HEART CATH AND CORONARY/GRAFT ANGIOGRAPHY N/A 11/09/2020   Procedure: RIGHT/LEFT HEART CATH AND CORONARY/GRAFT ANGIOGRAPHY;  Surgeon: [name], MD;  [facility];  Service: Cardiovascular;  Laterality: N/A;   STERNAL WOUND DEBRIDEMENT N/A 05/16/2022   Procedure: VAD DRIVELINE DEBRIDEMENT;  Surgeon: Lovett Sox, MD;  [facility];  Service: Thoracic;  Laterality: N/A;  VANC PULSE LAVAGE   STERNAL WOUND DEBRIDEMENT Left 06/14/2022   Procedure: DEBRIDEMENT OF VAD POWERCORD TUNNEL;  Surgeon: Lovett Sox, MD;  [facility];  Service: Thoracic;  Laterality: Left;   STERNAL WOUND DEBRIDEMENT N/A 06/27/2022   Procedure: ABDOMINAL WOUND DEBRIDEMENT, APPLICATION OF MYRIAD;  Surgeon: Lovett Sox, MD;  [facility];  Service: Thoracic;  Laterality: N/A;   STERNAL WOUND DEBRIDEMENT N/A 07/08/2022   Procedure:  ABDOMINAL WOUND DEBRIDEMENT;  Surgeon: Lovett Sox, MD;  Location: [facility] OR;  Service: Thoracic;  Laterality: N/A;   STERNAL WOUND DEBRIDEMENT N/A 01/28/2023   Procedure: ABDOMINAL DRIVELINE WOUND DEBRIDEMENT;  Surgeon: Lovett Sox, MD;  [facility];  Service: Thoracic;  Laterality: N/A;   STERNAL WOUND DEBRIDEMENT N/A 02/05/2023   Procedure: ABDOMINAL DRIVELINE WOUND DEBRIDEMENT;  Surgeon: Lovett Sox, MD;  [facility];  Service: Thoracic;  Laterality: N/A;   STERNAL WOUND DEBRIDEMENT N/A 02/12/2023   Procedure: ABDOMINAL DRIVELINE WOUND DEBRIDEMENT AND APPLICATION OF MATRIX WOUND DRESSING;  Surgeon:  Lovett Sox, MD;  [facility];  Service: Thoracic;  Laterality: N/A;   STERNAL WOUND DEBRIDEMENT N/A 07/09/2023   Procedure: DRIVELINE WOUND DEBRIDEMENT;  Surgeon: Lovett Sox, MD;  [facility];  Service: Thoracic;  Laterality: N/A;   STERNAL WOUND DEBRIDEMENT N/A 07/17/2023   Procedure: DRIVELINE WOUND DEBRIDEMENT;  Surgeon: Lovett Sox, MD;  [facility];  Service: Thoracic;  Laterality: N/A;   TEE WITHOUT CARDIOVERSION N/A 11/24/2019   Procedure: TRANSESOPHAGEAL ECHOCARDIOGRAM (TEE);  Surgeon: [name], MD;  Location: [facility] OR;  Service: Open Heart Surgery;  Laterality: N/A;   TEE WITHOUT CARDIOVERSION N/A 11/26/2019   Procedure: TRANSESOPHAGEAL ECHOCARDIOGRAM (TEE);  Surgeon: [name], MD;  Location: [facility] OR;  Service: Open Heart Surgery;  Laterality: N/A;   TEE WITHOUT CARDIOVERSION N/A 11/10/2020   Procedure: TRANSESOPHAGEAL ECHOCARDIOGRAM (TEE);  Surgeon: [name], MD;  Location: [facility] OR;  Service: Open Heart Surgery;  Laterality: N/A;   TEE WITHOUT CARDIOVERSION N/A 11/17/2020   Procedure: TRANSESOPHAGEAL ECHOCARDIOGRAM (TEE);  Surgeon: [name], MD;  Location: [facility] OR;  Service: Open Heart Surgery;  Laterality: N/A;   WOUND EXPLORATION N/A 05/31/2022   Procedure: WOUND EXPLORATION, Debridement and Irrigation of abdominal wound;  Surgeon: Lovett Sox, MD;  [facility];  Service: Thoracic;  Laterality: N/A;   Vitals BP 93/71 (BP Location: Left Arm)   Pulse 72   Temp 98 F (36.7 C) (Oral)   Resp 14   Ht 5\' 10"  (1.778 m)   Wt 93.7 kg   SpO2 97%   BMI 29.64 kg/m      Physical Exam Constitutional:  adult male lying in the bed, not in acute distress    Comments:   Cardiovascular:     Rate and Rhythm: Normal rate and regular rhythm.     Heart sounds: LVAD hum   Pulmonary:     Effort: Pulmonary effort is normal.     Comments: Normal lung sounds   Abdominal:     Palpations: Abdomen is soft.     Tenderness: non distended  and non tender , abdominal binder+  Musculoskeletal:        General: No swelling or tenderness in peripheral joints  Skin:    Comments: no rashes, Rt arm midline with no concerns   Neurological:     General: awake, alert and oriented, following commands.   Psychiatric:        Mood and Affect: Mood normal.    Pertinent Microbiology Results for orders placed or performed during the hospital encounter of 07/07/23  Surgical pcr screen     Status: None   Collection Time: 07/07/23  5:50 PM   Specimen: Nasal Mucosa; Nasal Swab  Result Value Ref Range Status   MRSA, PCR NEGATIVE NEGATIVE Final   Staphylococcus aureus NEGATIVE NEGATIVE Final    Comment: (NOTE) The Xpert SA Assay (FDA approved for NASAL specimens in patients 22  years of age and older), is one component of a comprehensive surveillance program. It is not intended to diagnose infection nor to guide or monitor treatment. Performed at [facility], [address], [place], [place] [address]   Fungus Culture With Stain     Status: None   Collection Time: 07/09/23 11:39 AM   Specimen: Path fluid; Body Fluid  Result Value Ref Range Status   Fungus Stain Final report  Final   Fungus (Mycology) Culture Final report  Final    Comment: (NOTE) Performed At: [facility] [address] [place], [place] [id number] Jolene Schimke MD QI:[id number]    Fungal Source ABSCESS  Final    Comment: Performed at [facility], [address] [place], [place] [address]  Aerobic/Anaerobic Culture w Gram Stain (surgical/deep wound)     Status: None   Collection Time: 07/09/23 11:39 AM   Specimen: Path fluid; Body Fluid  Result Value Ref Range Status   Specimen Description ABSCESS  Final   Special Requests DRIVELINE TUNNEL INFECTION  Final   Gram Stain   Final    NO WBC SEEN RARE GRAM POSITIVE COCCI IN PAIRS IN TETRADS    Culture   Final    FEW PSEUDOMONAS AERUGINOSA RARE METHICILLIN RESISTANT STAPHYLOCOCCUS  AUREUS NO ANAEROBES ISOLATED Sent to [facility] for further susceptibility testing. SEE SEPARATE REPORT Performed at [facility], [address] [place], [place] [address]    Report Status 07/20/2023 FINAL  Final   Organism ID, Bacteria PSEUDOMONAS AERUGINOSA  Final   Organism ID, Bacteria METHICILLIN RESISTANT STAPHYLOCOCCUS AUREUS  Final      Susceptibility   Methicillin resistant staphylococcus aureus - MIC*    CIPROFLOXACIN 4 RESISTANT Resistant     ERYTHROMYCIN >=8 RESISTANT Resistant     GENTAMICIN <=0.5 SENSITIVE Sensitive     OXACILLIN >=4 RESISTANT Resistant     TETRACYCLINE <=1 SENSITIVE Sensitive     VANCOMYCIN <=0.5 SENSITIVE Sensitive     TRIMETH/SULFA >=320 RESISTANT Resistant     CLINDAMYCIN <=0.25 SENSITIVE Sensitive     RIFAMPIN <=0.5 SENSITIVE Sensitive     Inducible Clindamycin NEGATIVE Sensitive     LINEZOLID 2 SENSITIVE Sensitive     * RARE METHICILLIN RESISTANT STAPHYLOCOCCUS AUREUS   Pseudomonas aeruginosa - MIC*    CEFTAZIDIME 4 SENSITIVE Sensitive     CIPROFLOXACIN 0.5 SENSITIVE Sensitive     GENTAMICIN <=1 SENSITIVE Sensitive     IMIPENEM 1 SENSITIVE Sensitive     PIP/TAZO 16 SENSITIVE Sensitive     CEFEPIME 2 SENSITIVE Sensitive     * FEW PSEUDOMONAS AERUGINOSA  Acid Fast Smear (AFB)     Status: None   Collection Time: 07/09/23 11:39 AM   Specimen: Path fluid; Body Fluid  Result Value Ref Range Status   AFB Specimen Processing Concentration  Final   Acid Fast Smear Negative  Final    Comment: (NOTE) Performed At: [facility] [address] [place], [place] [id number] Jolene Schimke MD VQ:[id number]    Source (AFB) ABSCESS  Final    Comment: Performed at [facility], [address], [place], [place] [address]  Fungus Culture Result     Status: None   Collection Time: 07/09/23 11:39 AM  Result Value Ref Range Status   Result 1 Comment  Final    Comment: (NOTE) KOH/Calcofluor preparation:  no fungus observed. Performed At:  [facility] [address] [place], [place] [id number] Jolene Schimke MD YT:[id number]  Fungal organism reflex     Status: None   Collection Time: 07/09/23 11:39 AM  Result Value Ref Range Status   Fungal result 1 Comment  Final    Comment: (NOTE) No yeast or mold isolated after 4 weeks. Performed At: [facility] [address] [place], [place] [id number] Jolene Schimke MD WG:[id number]   Culture, blood (Routine X 2) w Reflex to ID Panel     Status: None   Collection Time: 07/09/23  3:47 PM   Specimen: BLOOD RIGHT ARM  Result Value Ref Range Status   Specimen Description BLOOD RIGHT ARM  Final   Special Requests   Final    BOTTLES DRAWN AEROBIC AND ANAEROBIC Blood Culture adequate volume   Culture   Final    NO GROWTH 5 DAYS Performed at [facility], [address] [place], [place] [address]    Report Status 07/14/2023 FINAL  Final  Culture, blood (Routine X 2) w Reflex to ID Panel     Status: None   Collection Time: 07/09/23  3:47 PM   Specimen: BLOOD LEFT ARM  Result Value Ref Range Status   Specimen Description BLOOD LEFT ARM  Final   Special Requests   Final    BOTTLES DRAWN AEROBIC AND ANAEROBIC Blood Culture adequate volume   Culture   Final    NO GROWTH 5 DAYS Performed at [facility], [address] [place], [place] [address]    Report Status 07/14/2023 FINAL  Final   Pertinent Lab.    Latest Ref Rng & Units 08/12/2023   11:39 AM 08/11/2023    4:00 AM 08/10/2023    3:37 AM  CBC  WBC 4.0 - 10.5 K/uL 8.3  7.5  6.2   Hemoglobin 13.0 - 17.0 g/dL 84.1  9.9  9.2   Hematocrit 39.0 - 52.0 % 35.6  31.1  28.7   Platelets 150 - 400 K/uL 287  237  227       Latest Ref Rng & Units 08/13/2023    4:25 AM 08/12/2023   11:39 AM 08/11/2023    4:00 AM  CMP  Glucose 70 - 99 mg/dL 324  401  027   BUN 8 - 23 mg/dL 32  23  25   Creatinine 0.61 - 1.24 mg/dL 2.53  6.64  4.03   Sodium 135 - 145 mmol/L 137  136  135   Potassium 3.5 - 5.1 mmol/L 4.1   4.3  4.5   Chloride 98 - 111 mmol/L 100  97  99   CO2 22 - 32 mmol/L 25  28  27    Calcium 8.9 - 10.3 mg/dL 9.1  9.6  9.2      Pertinent Imaging today Plain films and CT images have been personally visualized and interpreted; radiology reports have been reviewed. Decision making incorporated into the Impression /   DG CHEST PORT 1 VIEW  Result Date: 07/31/2023 CLINICAL DATA:  CHF EXAM: PORTABLE CHEST 1 VIEW COMPARISON:  07/30/2023. FINDINGS: Prominent cardiac silhouette. Median sternotomy wires. Left ventricular device in place. Right lung clear. Continued evidence of left upper lung alveolar process that may represent pneumonia. No pneumothorax or pleural effusion. IMPRESSION: Possible left-sided pneumonia similar to the prior study. Prominent cardiac silhouette. Electronically Signed   By: Layla Maw M.D.   On: 07/31/2023 09:51   DG Chest Port 1 View  Result Date: 07/30/2023 CLINICAL DATA:  Left ventricular assist device. EXAM: PORTABLE CHEST 1 VIEW COMPARISON:  July 16, 2023. FINDINGS: Stable cardiomediastinal silhouette. Left ventricular assist device is noted. Sternotomy wires are noted. New left upper lobe airspace opacity is noted concerning for possible pneumonia. Right lung is clear. Old left clavicular fracture is noted. IMPRESSION: New left upper lobe airspace opacity is noted concerning for possible pneumonia. Electronically Signed   By: Lupita Raider M.D.   On: 07/30/2023 11:33   DG Abd Portable 1V  Result Date: 07/30/2023 CLINICAL DATA:  Left ventricular assist device.  Abdominal pain. EXAM: PORTABLE ABDOMEN - 1 VIEW COMPARISON:  None Available. FINDINGS: The bowel gas pattern is normal. Postsurgical changes are seen in the lumbar spine. Left ventricular assist device is noted. IMPRESSION: No abnormal bowel dilatation. Electronically Signed   By: Lupita Raider M.D.   On: 07/30/2023 11:31   DG Chest Port 1 View  Result Date: 07/16/2023 CLINICAL DATA:  Follow-up left  ventricular assist device EXAM: PORTABLE CHEST 1 VIEW COMPARISON:  04/22/2023 FINDINGS: Previous median sternotomy. Left ventricular assist device appears the same by radiography. Atrial clip remains in place. Heart size is stable. The lungs show mild scarring but no infiltrate, collapse or edema. No pleural fluid. No acute bone finding. IMPRESSION: No change since the prior study. Left ventricular assist device remains in place without change or complicating feature by radiography. Electronically Signed   By: Paulina Fusi M.D.   On: 07/16/2023 08:21    I have personally spent 51  minutes involved in face-to-face and non-face-to-face activities for this patient on the day of the visit. Professional time spent includes the following activities: Preparing to see the patient (review of tests), Obtaining and/or reviewing separately obtained history (admission/discharge record), Performing a medically appropriate examination and/or evaluation , Ordering medications/tests/procedures, referring and communicating with other health care professionals, Documenting clinical information in the EMR, Independently interpreting results (not separately reported), Communicating results to the patient/family/caregiver, Counseling and educating the patient/family/caregiver and Care coordination (not separately reported).   Plan d/w requesting provider as well as ID pharm D  Note: This document was prepared using dragon voice recognition software and may include unintentional dictation errors.   Electronically signed by:   Odette Fraction, MD Infectious Disease Physician [facility] for Infectious Disease Pager: [phone]

## 2023-08-13 NOTE — Plan of Care (Signed)
  Problem: Cardiac: Goal: LVAD will function as expected and patient will experience no clinical alarms Outcome: Progressing   Problem: Education: Goal: Knowledge of General Education information will improve Description: Including pain rating scale, medication(s)/side effects and non-pharmacologic comfort measures Outcome: Progressing   Problem: Health Behavior/Discharge Planning: Goal: Ability to manage health-related needs will improve Outcome: Progressing   Problem: Clinical Measurements: Goal: Ability to maintain clinical measurements within normal limits will improve Outcome: Progressing Goal: Diagnostic test results will improve Outcome: Progressing Goal: Respiratory complications will improve Outcome: Progressing Goal: Cardiovascular complication will be avoided Outcome: Progressing   Problem: Activity: Goal: Risk for activity intolerance will decrease Outcome: Progressing   Problem: Nutrition: Goal: Adequate nutrition will be maintained Outcome: Progressing   Problem: Coping: Goal: Level of anxiety will decrease Outcome: Progressing   Problem: Pain Managment: Goal: General experience of comfort will improve Outcome: Progressing   Problem: Safety: Goal: Ability to remain free from injury will improve Outcome: Progressing   Problem: Skin Integrity: Goal: Risk for impaired skin integrity will decrease Outcome: Progressing

## 2023-08-13 NOTE — Progress Notes (Signed)
Referral sent to [facility] in [place] per request of Dr. Maren Beach. Clinic will reach out to pt tomorrow for scheduling.   Simmie Davies RN, BSN VAD Coordinator 24/7 Pager [phone]

## 2023-08-13 NOTE — Progress Notes (Addendum)
ANTICOAGULATION CONSULT NOTE  Pharmacy Consult for warfarin  Indication:  LVAD  Allergies  Allergen Reactions   Vancomycin Nausea And Vomiting and Other (See Comments)    Severe joint pain    Patient Measurements: Height: 5\' 10"  (177.8 cm) Weight: 93.7 kg (206 lb 9.1 oz) IBW/kg (Calculated) : 73  Vital Signs: Temp: 97.9 F (36.6 C) (09/04 0800) Temp Source: Oral (09/04 0800) BP: 92/79 (09/04 0810) Pulse Rate: 112 (09/04 0800)  Labs: Recent Labs    08/11/23 0400 08/12/23 1139 08/13/23 0425  HGB 9.9* 11.5*  --   HCT 31.1* 35.6*  --   PLT 237 287  --   LABPROT 22.7* 20.5* 21.6*  INR 2.0* 1.7* 1.9*  CREATININE 1.10 1.04 1.24  CKTOTAL  --  61  --     Estimated Creatinine Clearance: 71 mL/min (by C-G formula based on SCr of 1.24 mg/dL).   Medical History: Past Medical History:  Diagnosis Date   "    Arthritis    CAD (coronary artery disease)    a. s/p CABG in 11/2019 with LIMA-LAD, SVG-OM1, SVG-PDA and SVG-D1   CHF (congestive heart failure) (HCC)    a. EF < 20% by echo in 11/2019   Essential hypertension    PAF (paroxysmal atrial fibrillation) (HCC)    Type 2 diabetes mellitus (HCC)     Medications:  Scheduled:   allopurinol  100 mg Oral Daily   amLODipine  10 mg Oral Daily   clonazepam  0.5 mg Oral BID   docusate sodium  100 mg Oral BID   fluticasone furoate-vilanterol  1 puff Inhalation Daily   And   umeclidinium bromide  1 puff Inhalation Daily   gabapentin  300 mg Oral TID   insulin aspart  0-20 Units Subcutaneous TID WC   insulin aspart  0-5 Units Subcutaneous QHS   insulin glargine-yfgn  12 Units Subcutaneous Daily   losartan  50 mg Oral Daily   metoprolol succinate  25 mg Oral Daily   minocycline  200 mg Oral BID   mirtazapine  15 mg Oral QHS   pantoprazole  40 mg Oral Daily   polyethylene glycol  17 g Oral Daily   sertraline  50 mg Oral Daily   sodium chloride flush  10-40 mL Intracatheter Q12H   spironolactone  12.5 mg Oral Daily    tamsulosin  0.4 mg Oral Daily   thiamine  100 mg Oral Daily   traZODone  150 mg Oral QHS   warfarin  6 mg Oral q1600   Warfarin - Pharmacist Dosing Inpatient   Does not apply q1600   zinc sulfate  220 mg Oral Daily    Assessment: 62 yom who presented to clinic with concern for driveline fracture and infection. Last [facility] appointment was on 04/21/2023 - INR was subtherapeutic at 1.4 and regimen at that time was 6 mg daily except 4 mg Tues/Thurs (LD 7/25). Given low INR on admit, started fixed rate heparin 500 units/hr - now stopped.   INR fell from 2> 1.7 when warfarin dose reduced from 6mg  every day to pta regimen 4mg  TT and 6mg  aod.  Hgb 9's, platelets stable.  No bleeding. INR now rising back to goal 1.9 today   Goal of Therapy:  INR 2-2.5 Monitor platelets by anticoagulation protocol: Yes   Plan:  Continue warfarin 6mg  daily Plan to dc home tomorrow on warfarin 4mg  TT / 6mg  AOD - with addition of cipro for chronic suppression for recent pseudomonal DLI -  per ID team Monitor daily INR, CBC, LDH, and for s/sx of bleeding     Leota Sauers Pharm.D. CPP, BCPS Clinical Pharmacist [phone] 08/13/2023 9:19 AM    Please check AMION for all [facility] phone numbers After 10:00 PM, call Main Pharmacy [phone]

## 2023-08-13 NOTE — TOC Progression Note (Signed)
Transition of Care (TOC) - Progression Note    Patient Details  Name: Kevin Underwood. MRN: [id number] Date of Birth: [birth year]  Transition of Care [facility]) CM/SW Contact  [name], RN Phone Number: [phone] 08/13/2023, 3:48 PM  Clinical Narrative:   Patient does not want hospital bed. Contacted Home Health agencies [facility] Unable to accept referral due to limited [facility] in his area.  Contacted [facility] rep, with new referral. Able to accept referral for [facility] RN, and PT.       Expected Discharge Plan: Home w Home Health Services Barriers to Discharge: Continued Medical Work up  Expected Discharge Plan and Services   Discharge Planning Services: CM Consult Post Acute Care Choice: Home Health Living arrangements for the past 2 months: Single Family Home                           HH Arranged: RN, PT           Social Determinants of Health (SDOH) Interventions SDOH Screenings   Food Insecurity: No Food Insecurity (07/08/2023)  Housing: Low Risk  (07/08/2023)  Transportation Needs: No Transportation Needs (07/08/2023)  Utilities: Not At Risk (07/08/2023)  Alcohol Screen: Low Risk  (11/10/2020)  Depression (PHQ2-9): Low Risk  (10/16/2022)  Financial Resource Strain: Medium Risk (05/17/2022)  Physical Activity: Inactive (11/10/2020)  Tobacco Use: Medium Risk (07/09/2023)    Readmission Risk Interventions    07/01/2022    9:05 AM 11/28/2020   11:57 AM  Readmission Risk Prevention Plan  Transportation Screening Complete Complete  Medication Review [employer]) Complete Complete  PCP or Specialist appointment within 3-5 days of discharge Complete   HRI or Home Care Consult Complete Complete  SW Recovery Care/Counseling Consult Complete Complete  Palliative Care Screening Not Applicable Not Applicable  Skilled Nursing Facility Not Applicable Not Applicable

## 2023-08-13 NOTE — Progress Notes (Signed)
CT surgery  Patient is almost completed his 4 weeks of IV antibiotics for the dual organism VAD tunnel infection. Today he is comfortable sitting up in the chair. The VAD wound dressing is dry and intact. INR is therapeutic. Patient should be ready for discharge within the next 48 hours.  We discussed outpatient wound care, importance of good nutrition, and importance of regular VAD clinic follow-up visits to monitor progress of the wound. Patient's family has been instructed on performing Vashe wet-to-dry dressing changes which will need to be continued daily. We discussed pain management and the concern over long-term narcotic use.  We discussed that having the patient evaluated at a pain clinic would  be beneficial for long-term management of his pain from the abdominal wound.  Blood pressure 92/79, pulse (!) 112, temperature 97.9 F (36.6 C), temperature source Oral, resp. rate 19, height 5\' 10"  (1.778 m), weight 93.7 kg, SpO2 97%.

## 2023-08-13 NOTE — Progress Notes (Signed)
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 Started on IV antibiotics.   Patient presented to VAD clinic 7/29 for driveline communication fault- stating that he has had "alarms every couple hrs from his VAD for a couple months." Pt has no showed and cancelled all appointments in the past 2 months. Pt also had green/blue drainage visible through his driveline bandage. (Dressing change and wound cx obtained). Pt states that he has experienced dizziness associated with a fall multiple times over the past couple months but no signs of bleeding. Controller and modular cable exchanged. No further communication fault alarms noted.   CT Chest/Abd/Pelvis 7/29 1. Similar appearance of mild circumferential thickening of the fat plane surrounding the drive line in the upper abdomen extending from the pump to the skin which may be chronic or represent an infectious process. No drainable fluid collection.  Pt had drive line debridement in OR with placement of instillation wound vac on 07/09/23 with Dr Donata Clay. This was change to wet/dry on Friday 8/2 due to ongoing alarms related to the pts hernia.   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 Currently on IV Cefepime Q 8 hrs and IV Daptomycin 800 mg daily per ID team. IV antibiotic end date 9/5 per ID team. Pt is refusing to go a facility for IV antibiotics. ID team to determine PO antibiotic regimen at discharge.   Pt lying in bed on my arrival. In better spirits today.   Vital signs: Temp: 98 HR: 76 Doppler Pressure: 92 Automatic BP: 93/71(79) O2 Sat: 97% on RA Wt: 193.3>194.8>196.4>194.7>...>211.4>209.2>206.8>208.4>211.6>208.3>208.8>210.7>203.9>198.8>209>206.6 lbs  LVAD interrogation reveals:  Speed: 5650 Flow: 4.1 Power: 4.3w PI: 4.5 Alarms: none Events: 100+ Hct: 31  Fixed speed:  5600 Low speed limit:  5300  Drive  Line: Existing VAD dressing removed and site care performed using sterile technique. Wound bed cleansed with VASHE solution and sterile 4x4 gauze through light manual debridement. Skin surrounding wound bed cleaned with Chlora prep applicators x 2, allowed to dry. VASHE moistened 2 x 2s  placed in wound bed, covered with several dry 4 x 4s. Wound tunnels approx 2 cm.  Drive line unincorporated. Small amount of thick yellow/serosanguinous drainage noted. No redness, tenderness, foul odor or rash noted.  Drive line anchor secured.   Continue daily wet to dry dressing changes using VASHE solution per bedside RN or VAD coordinator. Next dressing change due 08/14/23 by bedside nurse.       Labs:  LDH trend: 121>133>130>138>172>175>165>158>169>155>170>167>173>161>163>156>165>168>158>149>153>167>185>205>>153>159>170  INR trend: 1.0>1.1>1.2>1.4>1.3>1.4>1.5>1.4>1.8>1.9>1.7>1.8>1.9>2.1>2.2>1.2>2.1>2.6>2.2>2.1>2.1>2>2.1>2.0>1.9  Anticoagulation Plan: -INR Goal: 2.0 - 2.5 -ASA Dose: none  ICD: N/A  Infection:  07/07/23>> blood cultures>> no growth 5 days; final  07/07/23>> wound cx>> few pseudomonas aeruginosa and MRSA; final  07/09/23>> OR driveline cx>> Few pseudomonas aeruginosa and METHICILLIN RESISTANT STAPHYLOCOCCUS AUREUS 07/09/23>> blood cultures>>no growth x 5 days; final  Drips:    Plan/Recommendations:  Page VAD coordinator with equipment issues or driveline problems Daily drive line wet to dry dressing changes with VASHE solution.   Simmie Davies RN,BSN VAD Coordinator  Office: [phone]  24/7 Pager: [phone]

## 2023-08-13 NOTE — Plan of Care (Signed)
  Problem: Education: Goal: Patient will understand all VAD equipment and how it functions Outcome: Progressing Goal: Patient will be able to verbalize current INR target range and antiplatelet therapy for discharge home Outcome: Progressing   Problem: Cardiac: Goal: LVAD will function as expected and patient will experience no clinical alarms Outcome: Progressing   Problem: Education: Goal: Knowledge of General Education information will improve Description: Including pain rating scale, medication(s)/side effects and non-pharmacologic comfort measures Outcome: Progressing   Problem: Health Behavior/Discharge Planning: Goal: Ability to manage health-related needs will improve Outcome: Progressing   Problem: Clinical Measurements: Goal: Ability to maintain clinical measurements within normal limits will improve Outcome: Progressing Goal: Will remain free from infection Outcome: Progressing Goal: Diagnostic test results will improve Outcome: Progressing Goal: Respiratory complications will improve Outcome: Progressing Goal: Cardiovascular complication will be avoided Outcome: Progressing   Problem: Activity: Goal: Risk for activity intolerance will decrease Outcome: Progressing   Problem: Nutrition: Goal: Adequate nutrition will be maintained Outcome: Progressing   Problem: Coping: Goal: Level of anxiety will decrease Outcome: Progressing   Problem: Elimination: Goal: Will not experience complications related to bowel motility Outcome: Progressing Goal: Will not experience complications related to urinary retention Outcome: Progressing   Problem: Pain Managment: Goal: General experience of comfort will improve Outcome: Progressing   Problem: Safety: Goal: Ability to remain free from injury will improve Outcome: Progressing   Problem: Skin Integrity: Goal: Risk for impaired skin integrity will decrease Outcome: Progressing   Problem: Education: Goal: Ability  to describe self-care measures that may prevent or decrease complications (Diabetes Survival Skills Education) will improve Outcome: Progressing   Problem: Coping: Goal: Ability to adjust to condition or change in health will improve Outcome: Progressing   Problem: Fluid Volume: Goal: Ability to maintain a balanced intake and output will improve Outcome: Progressing   Problem: Health Behavior/Discharge Planning: Goal: Ability to identify and utilize available resources and services will improve Outcome: Progressing Goal: Ability to manage health-related needs will improve Outcome: Progressing   Problem: Metabolic: Goal: Ability to maintain appropriate glucose levels will improve Outcome: Progressing   Problem: Nutritional: Goal: Maintenance of adequate nutrition will improve Outcome: Progressing Goal: Progress toward achieving an optimal weight will improve Outcome: Progressing   Problem: Skin Integrity: Goal: Risk for impaired skin integrity will decrease Outcome: Progressing

## 2023-08-14 DIAGNOSIS — T827XXA Infection and inflammatory reaction due to other cardiac and vascular devices, implants and grafts, initial encounter: Secondary | ICD-10-CM | POA: Diagnosis not present

## 2023-08-14 LAB — BASIC METABOLIC PANEL
Anion gap: 12 (ref 5–15)
BUN: 32 mg/dL — ABNORMAL HIGH (ref 8–23)
CO2: 23 mmol/L (ref 22–32)
Calcium: 8.8 mg/dL — ABNORMAL LOW (ref 8.9–10.3)
Chloride: 100 mmol/L (ref 98–111)
Creatinine, Ser: 1.13 mg/dL (ref 0.61–1.24)
GFR, Estimated: >60 mL/min (ref >60)
Glucose, Bld: 126 mg/dL — ABNORMAL HIGH (ref 70–99)
Potassium: 4.1 mmol/L (ref 3.5–5.1)
Sodium: 135 mmol/L (ref 135–145)

## 2023-08-14 LAB — GLUCOSE, CAPILLARY
Glucose-Capillary: 104 mg/dL — ABNORMAL HIGH (ref 70–99)
Glucose-Capillary: 180 mg/dL — ABNORMAL HIGH (ref 70–99)
Glucose-Capillary: 146 mg/dL — ABNORMAL HIGH (ref 70–99)
Glucose-Capillary: 112 mg/dL — ABNORMAL HIGH (ref 70–99)

## 2023-08-14 LAB — MAGNESIUM: Magnesium: 2 mg/dL (ref 1.7–2.4)

## 2023-08-14 LAB — PROTIME-INR
INR: 2 — ABNORMAL HIGH (ref 0.8–1.2)
Prothrombin Time: 22.9 s — ABNORMAL HIGH (ref 11.4–15.2)

## 2023-08-14 LAB — LACTATE DEHYDROGENASE: LDH: 163 U/L (ref 98–192)

## 2023-08-14 NOTE — Progress Notes (Addendum)
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 Started on IV antibiotics.   Patient presented to VAD clinic 7/29 for driveline communication fault- stating that he has had "alarms every couple hrs from his VAD for a couple months." Pt has no showed and cancelled all appointments in the past 2 months. Pt also had green/blue drainage visible through his driveline bandage. (Dressing change and wound cx obtained). Pt states that he has experienced dizziness associated with a fall multiple times over the past couple months but no signs of bleeding. Controller and modular cable exchanged. No further communication fault alarms noted.   CT Chest/Abd/Pelvis 7/29 1. Similar appearance of mild circumferential thickening of the fat plane surrounding the drive line in the upper abdomen extending from the pump to the skin which may be chronic or represent an infectious process. No drainable fluid collection.  Pt had drive line debridement in OR with placement of instillation wound vac on 07/09/23 with Dr Donata Clay. This was change to wet/dry on Friday 8/2 due to ongoing alarms related to the pts hernia.   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 Currently on IV Cefepime Q 8 hrs and IV Daptomycin 800 mg daily per ID team. IV antibiotic end date 9/5 per ID team. Pt is refusing to go a facility for IV antibiotics. Will discharge on PO Minocycline 200 mg BID (for MRSA and Acinetobacter suppression) and Ciprofloxacin 750 mg daily (for pseudomonas suppression) per ID. Has ID follow up appt scheduled 08/25/23 at 1:45 PM.   Sitting up on side of the bed. Seems to be in a good spirits today. He is looking forward to going home tomorrow. Discussed referral to Pain Clinic- he is aware they will call him to schedule appointments.   VAD clinic follow up appt scheduled: 08/20/23 at 9 AM for INR and dressing  change, and 09/03/23 at 9 AM for hospital discharge f/u.   Vital signs: Temp: 97.9 HR: 76 Doppler Pressure: 98 Automatic BP: 92/70 (79) O2 Sat: 96% on RA Wt: 193.3>194.8>196.4>194.7>...>211.4>209.2>206.8>208.4>211.6>208.3>208.8>210.7>203.9>198.8>209>206.6>210.5 lbs  LVAD interrogation reveals:  Speed: 5600 Flow: 4.6 Power: 4.3w PI: 2.5 Alarms: none Events: 100+ Hct: 31  Fixed speed:  5600 Low speed limit:  5300  Drive Line: Existing VAD dressing removed and site care performed using sterile technique. Wound bed cleansed with VASHE solution and sterile 4x4 gauze through light manual debridement. Skin surrounding wound bed cleaned with Chlora prep applicators x 2, allowed to dry. VASHE moistened 2 x 2s placed in wound bed, covered with several dry 4 x 4s. Wound tunnels approx 2 cm.  Drive line partially incorporated. Small amount of thick yellow/serosanguinous drainage noted. No redness, tenderness, foul odor or rash noted.  Drive line anchor reapplied. Continue daily wet to dry dressing changes using VASHE solution per bedside RN or VAD coordinator. Next dressing change due 08/15/23 by bedside nurse.      Labs:  LDH trend: 121>133>130>138>172>175>165>158>169>155>170>167>173>161>163>156>165>168>158>149>153>167>185>205>>153>159>170>163  INR trend: 1.0>1.1>1.2>1.4>1.3>1.4>1.5>1.4>1.8>1.9>1.7>1.8>1.9>2.1>2.2>1.2>2.1>2.6>2.2>2.1>2.1>2>2.1>2.0>1.9>2.0  Anticoagulation Plan: -INR Goal: 2.0 - 2.5 -ASA Dose: none  ICD: N/A  Infection:  07/07/23>> blood cultures>> no growth 5 days; final  07/07/23>> wound cx>> few pseudomonas aeruginosa and MRSA; final  07/09/23>> OR driveline cx>> Few pseudomonas aeruginosa and METHICILLIN RESISTANT STAPHYLOCOCCUS AUREUS 07/09/23>> blood cultures>>no growth x 5 days; final  Drips:    Plan/Recommendations:  Page VAD coordinator with equipment issues or driveline problems Daily drive line wet to dry  dressing changes with VASHE solution.   Alyce Pagan RN VAD Coordinator  Office: [phone]  24/7 Pager: [phone]

## 2023-08-14 NOTE — Progress Notes (Signed)
ANTICOAGULATION CONSULT NOTE  Pharmacy Consult for warfarin  Indication:  LVAD  Allergies  Allergen Reactions   Vancomycin Nausea And Vomiting and Other (See Comments)    Severe joint pain    Patient Measurements: Height: 5\' 10"  (177.8 cm) Weight: 95.5 kg (210 lb 8.6 oz) IBW/kg (Calculated) : 73  Vital Signs: Temp: 97.9 F (36.6 C) (09/05 0747) Temp Source: Oral (09/05 0747) BP: 92/70 (09/05 0747) Pulse Rate: 73 (09/05 0837)  Labs: Recent Labs    08/12/23 1139 08/13/23 0425 08/14/23 0420  HGB 11.5*  --   --   HCT 35.6*  --   --   PLT 287  --   --   LABPROT 20.5* 21.6* 22.9*  INR 1.7* 1.9* 2.0*  CREATININE 1.04 1.24 1.13  CKTOTAL 61  --   --     Estimated Creatinine Clearance: 78.6 mL/min (by C-G formula based on SCr of 1.13 mg/dL).   Medical History: Past Medical History:  Diagnosis Date   "    Arthritis    CAD (coronary artery disease)    a. s/p CABG in 11/2019 with LIMA-LAD, SVG-OM1, SVG-PDA and SVG-D1   CHF (congestive heart failure) (HCC)    a. EF < 20% by echo in 11/2019   Essential hypertension    PAF (paroxysmal atrial fibrillation) (HCC)    Type 2 diabetes mellitus (HCC)     Medications:  Scheduled:   allopurinol  100 mg Oral Daily   amLODipine  10 mg Oral Daily   [START ON 08/15/2023] ciprofloxacin  750 mg Oral BID   clonazepam  0.5 mg Oral BID   docusate sodium  100 mg Oral BID   fluticasone furoate-vilanterol  1 puff Inhalation Daily   And   umeclidinium bromide  1 puff Inhalation Daily   gabapentin  300 mg Oral TID   influenza vac split trivalent PF  0.5 mL Intramuscular Tomorrow-1000   insulin aspart  0-20 Units Subcutaneous TID WC   insulin aspart  0-5 Units Subcutaneous QHS   insulin glargine-yfgn  12 Units Subcutaneous Daily   losartan  50 mg Oral Daily   metoprolol succinate  25 mg Oral Daily   minocycline  200 mg Oral BID   mirtazapine  15 mg Oral QHS   pantoprazole  40 mg Oral Daily   polyethylene glycol  17 g Oral Daily    sertraline  50 mg Oral Daily   sodium chloride flush  10-40 mL Intracatheter Q12H   spironolactone  12.5 mg Oral Daily   tamsulosin  0.4 mg Oral Daily   thiamine  100 mg Oral Daily   traZODone  150 mg Oral QHS   warfarin  6 mg Oral q1600   Warfarin - Pharmacist Dosing Inpatient   Does not apply q1600   zinc sulfate  220 mg Oral Daily    Assessment: 62 yom who presented to clinic with concern for driveline fracture and infection. Last [facility] appointment was on 04/21/2023 - INR was subtherapeutic at 1.4 and regimen at that time was 6 mg daily except 4 mg Tues/Thurs (LD 7/25). Given low INR on admit, started fixed rate heparin 500 units/hr - now stopped.   INR now therapeutic on 6 mg daily (INR 2 today). Hgb 11.5, plt 287. LDH 163. No s/sx of bleeding.   Goal of Therapy:  INR 2-2.5 Monitor platelets by anticoagulation protocol: Yes   Plan:  Continue warfarin 6 mg tonight  Plan to d/c home on warfarin 4mg  TT / 6mg   AOD - with addition of cipro for chronic suppression for recent pseudomonal DLI - per ID team Monitor daily INR, CBC, LDH, and for s/sx of bleeding   Thank you for allowing pharmacy to participate in this patient's care,  Sherron Monday, PharmD, BCCCP Clinical Pharmacist  Phone: [phone] 08/14/2023 9:14 AM  Please check AMION for all [facility] Pharmacy phone numbers After 10:00 PM, call Main Pharmacy [phone]

## 2023-08-14 NOTE — Progress Notes (Signed)
Outpatient VAD CSW met with patient at bedside to assist with obtaining a phone. Patient grateful as he will need to communicate with providers and outpatient follow up post hospitalization and does not have a working phone. CSW provided supportive needs and will follow outpatient as needed. Lasandra Beech, LCSW, CCSW-MCS [phone]

## 2023-08-14 NOTE — Progress Notes (Addendum)
Patient ID: Kevin Rigg., male   DOB: 09/14/1961, 62 y.o.   MRN: [id number]   Advanced Heart Failure VAD Team Note  PCP-Cardiologist: Nona Dell, MD  [facility]: Dr. Shirlee Latch   Subjective:    7/29: admitted w/ suspected DL fracture and DL infection. P/w "driveline communication fault" error message on LVAD. No pump stoppages. LVAD coordinator replaced modular cable and controller with resolution of driveline communication fault signal. CT C/A/P c/w prior imaging w/ mild circumferential thickening of the fat plane surrounding the drive line in the upper abdomen extending from the pump to the skin which may be chronic or represent an infectious process. No drainable fluid collection. Wound + BCx collected. Started on empiric per ID.  7/30: Wound Cx growing Pseudomonas Aeruginosa (pan-sensitive) and S. aureus.  7/31: OR for wound debridement + application of wound vac.   8/8: Returned to OR for I&D  Remains on Daptomycin, Cefepime and minocycline. Surgical wound with pseudomonas aeruginosa and MRSA. Bcx from 7/29 and 7/31 NG.   Scheduled to complete IV abx today. Feels ok today but c/w chronic ventral hernia discomfort. Denies fever and chills. No CP or dyspnea.   INR 2.0    LVAD INTERROGATION:  HeartMate III LVAD:   Flow 4.7 liters/min, speed 5450 power 4.2 PI  3.0  VAD interrogated personally.   Objective:    Vital Signs:   Temp:  [97.9 F (36.6 C)-98.6 F (37 C)] 97.9 F (36.6 C) (09/05 0747) Pulse Rate:  [72-82] 73 (09/05 0747) Resp:  [14-19] 19 (09/05 0747) BP: (92-124)/(56-98) 92/70 (09/05 0747) SpO2:  [94 %-98 %] 96 % (09/05 0747) Weight:  [95.5 kg] 95.5 kg (09/05 0630) Last BM Date : 08/13/23 Mean arterial Pressure 79 Intake/Output:   Intake/Output Summary (Last 24 hours) at 08/14/2023 0805 Last data filed at 08/14/2023 0752 Gross per 24 hour  Intake 1280.03 ml  Output --  Net 1280.03 ml    Physical Exam   General:  well appearing. NAD  HEENT: normal  Neck:  supple. JVD not elevated.  Carotids 2+ bilat; no bruits. No lymphadenopathy or thryomegaly appreciated. Cor: LVAD hum.  Lungs: CTAB  Abdomen: + large ventral hernia, tense and mildly tender. DL and wound dressing ok Extremities: no cyanosis, clubbing, rash. Warm and dry. No LEE + RUE PICC Neuro: alert & oriented x 3. No focal deficits. Moves all 4 without problem    Telemetry   NSR 70s Personally reviewed)    Labs   Basic Metabolic Panel: Recent Labs  Lab 08/10/23 0337 08/11/23 0400 08/12/23 1139 08/13/23 0425 08/14/23 0420  NA 138 135 136 137 135  K 4.3 4.5 4.3 4.1 4.1  CL 102 99 97* 100 100  CO2 25 27 28 25 23   GLUCOSE 174* 127* 112* 159* 126*  BUN 26* 25* 23 32* 32*  CREATININE 1.04 1.10 1.04 1.24 1.13  CALCIUM 8.8* 9.2 9.6 9.1 8.8*  MG 1.9 2.1 2.0 2.0 2.0    Liver Function Tests: No results for input(s): "AST", "ALT", "ALKPHOS", "BILITOT", "PROT", "ALBUMIN" in the last 168 hours.  CBC: Recent Labs  Lab 08/09/23 0620 08/10/23 0337 08/11/23 0400 08/12/23 1139  WBC 6.8 6.2 7.5 8.3  HGB 10.1* 9.2* 9.9* 11.5*  HCT 31.2* 28.7* 31.1* 35.6*  MCV 84.6 86.2 84.5 82.4  PLT 259 227 237 287    INR: Recent Labs  Lab 08/10/23 0337 08/11/23 0400 08/12/23 1139 08/13/23 0425 08/14/23 0420  INR 2.1* 2.0* 1.7* 1.9* 2.0*    Other  results:    Imaging   No results found.   Medications:     Scheduled Medications:  allopurinol  100 mg Oral Daily   amLODipine  10 mg Oral Daily   [START ON 08/15/2023] ciprofloxacin  750 mg Oral BID   clonazepam  0.5 mg Oral BID   docusate sodium  100 mg Oral BID   fluticasone furoate-vilanterol  1 puff Inhalation Daily   And   umeclidinium bromide  1 puff Inhalation Daily   gabapentin  300 mg Oral TID   influenza vac split trivalent PF  0.5 mL Intramuscular Tomorrow-1000   insulin aspart  0-20 Units Subcutaneous TID WC   insulin aspart  0-5 Units Subcutaneous QHS   insulin glargine-yfgn  12 Units Subcutaneous Daily    losartan  50 mg Oral Daily   metoprolol succinate  25 mg Oral Daily   minocycline  200 mg Oral BID   mirtazapine  15 mg Oral QHS   pantoprazole  40 mg Oral Daily   polyethylene glycol  17 g Oral Daily   sertraline  50 mg Oral Daily   sodium chloride flush  10-40 mL Intracatheter Q12H   spironolactone  12.5 mg Oral Daily   tamsulosin  0.4 mg Oral Daily   thiamine  100 mg Oral Daily   traZODone  150 mg Oral QHS   warfarin  6 mg Oral q1600   Warfarin - Pharmacist Dosing Inpatient   Does not apply q1600   zinc sulfate  220 mg Oral Daily    Infusions:  ceFEPime (MAXIPIME) IV 2 g (08/14/23 0417)   DAPTOmycin (CUBICIN) 800 mg in sodium chloride 0.9 % IVPB Stopped (08/13/23 1710)    PRN Medications: acetaminophen, fluticasone, oxyCODONE, sodium chloride flush, traMADol   Patient Profile  62 y.o. 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 Now s/p Heartmate 3 LVAD. Now admitted w/ suspected DL fracture after multiple falls and recurrent DL infection.  Assessment/Plan:   1.  LVAD Driveline infection: MRSA driveline infection, admitted in 6/23 and again in 7/23-8/23 with multiple trips to the OR for debridement.  Cultures have grown acinetobacter and MRSA. He was readmitted with driveline infection in 2/24, completed course of daptomycin for MRSA. Now readmitted w/ suspected DL fracture and DL infection. P/w "driveline communication fault" error message on LVAD.LVAD coordinator replaced modular cable and controller with resolution of driveline communication fault signal. CT C/A/P c/w prior imaging w/ similar appearance of mild circumferential thickening of the fat plane surrounding the drive line in the upper abdomen extending from the pump to the skin which may be chronic or represent an infectious  process. No drainable fluid collection.  -Initial wound Cx + for Pseudomonas Aeruginosa (pan-sensitive) and S. aureus. Now on Dapto, Cefepime and minocycline. BC x 2 NG. Taken to OR 7/31 for wound debridement + application of wound vac. Returned to OR 8/7 for wound wash out/debridement - Continue abx per ID. ID recommends 4 weeks of IV antibiotics. He will complete IV course today (9/5) - per ID. Plan PO minocycline and ciprofloxacin, once discharged, to complete 6 weeks course. EOT 08/28/23. Then can continue minocycline for suppression of MRSA along with Acinetobacter and reassess need of ciprofloxacin for PsA suppression.  - f/u appointment with Dr Thedore Mins 9/16. Appreciate ID's assistance    2. Suspected  DL fracture with multiple recent falls - Has had multiple falls during the last 2 months (prior to admit), one direct trauma to mid sternum (fell on cinder block) - CT chest/abd/pelvis per above  - CXR w/ LVAD partially imaged. The associated lead noted to be intact w/ no fracture or kink  - LVAD coordinators changed modular cable and controller with resolution  - stable  3.  Chronic systolic CHF s/p LVAD: Echo 10/21 with EF 20-25%, mildly decreased RV function. LHC/RHC in 12/21 with patent grafts, low output. Suspect mixed ischemic/nonischemic cardiomyopathy (prior heavy ETOH and drugs as well as CAD). Admitted with cardiogenic shock in 12/21, had placement of Impella 5.5 initially, now s/p Heartmate 3 LVAD on 11/17/20.  Ramp echo 2/23 with speed decreased to 5500 rpm. Speed now back to 5600 rpm.  LVAD parameters stable.  - Volume stable - Continue current Toprol XL 25 mg daily.  - Did not tolerate Entresto in the past d/t AKI and low MAPs.  - MAPs improved with increase in losartan to 100 daily. Can use entresto as needed   - INR 2.0 and LDH stable at 163 - VAD interrogated personally. Parameters stable.   4. CKD stage 3: Prior CTA abdomen/pelvis with high-grade stenosis of proximal right  renal artery.  -  SCr has been stable through admission  5.  CAD: S/p CABG 12/20.  LHC pre-VAD with patent grafts, no target for intervention. - Continue Crestor 20 mg daily.  - No s/s angina  6.  Atrial fibrillation: Paroxysmal.  S/p Maze and LA appendage clip with CABG in 12/20.  Now in NSR.  - Continue Toprol XL 25 mg daily.  - Off amiodarone with hyperthyroidism. - In NSR  7. Type 2 diabetes: improved control, Hgb A1c down from 12.1>>6.3  - continue SSI   8. 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 He says last use of methamphetamine was > 2 months ago. - UDS this admit negative (however collected > 48 hrs post admit)  - Declines substance use resources  9. 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  Followed by endocrinology. He is now off amiodarone. He had been on methimazole but has not been taking recently. TSH was high on admit and free T4 normal  - Will NOT restart methimazole   10. Anemia (chronic blood loss):  CBC has been stable.    11. COPD: Prior smoker.  - Continue Trelegy.   12. PVCs/NSVT:  - Now off amiodarone.   13.  Depression: Has turned to substance abuse to cope.   - Has been referred to OP psych in [place]   14.  Gout: Continue allopurinol, he has prn colchicine.   15.  HTN:  - MAPs improved. Now 70s on increased dose losartan  16. Agitation: Klonopin increased, agitation improved  17. Incisional hernia: Large abdominal wall hernia in close proximity to driveline. Cannot tolerate wearing abdominal binder.  Has chronic discomfort from the hernia and the driveline.  It is not incarcerated.  - Continue oxycodone. Alternating with Tramadol. - No good option for hernia repair given driveline proximity and size of defect.  - No change  18. Constipation - Continue colace and miralax.   Plan d/c home tomorrow.   I reviewed the LVAD parameters from today, and compared the results to the patient's prior recorded data.  No  programming changes were made.  The LVAD is functioning within specified parameters.  The patient performs LVAD self-test daily.  LVAD interrogation was negative  for any significant power changes, alarms or PI events/speed drops.  LVAD equipment check completed and is in good working order.  Back-up equipment present.   LVAD education done on emergency procedures and precautions and reviewed exit site care.  Length of Stay: [address], PA-C 08/14/2023, 8:05 AM  VAD Team --- VAD ISSUES ONLY--- Pager [phone] (7am - 7am)  Advanced Heart Failure Team  Pager [phone] (M-F; 7a - 5p)  Please contact [facility] for night-coverage after hours (5p -7a ) and   Patient seen and examined with the above-signed Advanced Practice Provider and/or Housestaff. I personally reviewed laboratory data, imaging studies and relevant notes. I independently examined the patient and formulated the important aspects of the plan. I have edited the note to reflect any of my changes or salient points. I have personally discussed the plan with the patient and/or family.  Finishes IV abx today. No fevers or chills. MAPS improve 70-80s   General:  NAD.  HEENT: normal  Neck: supple. JVP not elevated.  Carotids 2+ bilat; no bruits. No lymphadenopathy or thryomegaly appreciated. Cor: LVAD hum.  Lungs: Clear. Abdomen:soft, nontender, large hernia . No bruits or masses. Good bowel sounds. Driveline site and wound dressing clean An[place] in place.  Extremities: no cyanosis, clubbing, rash. Warm no edema  Neuro: alert & oriented x 3. No focal deficits. Moves all 4 without problem   He finishes IV abx today. Should be suitable for d/c later today or tomorrow depending on timing. MAPs controlled. Pain controlled.   VAD interrogated personally. Parameters stable.  INR 2.0. Discussed dosing with PharmD personally.  Arvilla Meres, MD  9:52 AM

## 2023-08-14 NOTE — Plan of Care (Signed)
  Problem: Education: Goal: Patient will understand all VAD equipment and how it functions Outcome: Progressing Goal: Patient will be able to verbalize current INR target range and antiplatelet therapy for discharge home Outcome: Progressing   Problem: Cardiac: Goal: LVAD will function as expected and patient will experience no clinical alarms Outcome: Progressing   Problem: Education: Goal: Knowledge of General Education information will improve Description: Including pain rating scale, medication(s)/side effects and non-pharmacologic comfort measures Outcome: Progressing   Problem: Health Behavior/Discharge Planning: Goal: Ability to manage health-related needs will improve Outcome: Progressing   Problem: Clinical Measurements: Goal: Ability to maintain clinical measurements within normal limits will improve Outcome: Progressing Goal: Will remain free from infection Outcome: Progressing Goal: Diagnostic test results will improve Outcome: Progressing Goal: Respiratory complications will improve Outcome: Progressing Goal: Cardiovascular complication will be avoided Outcome: Progressing   Problem: Activity: Goal: Risk for activity intolerance will decrease Outcome: Progressing   Problem: Nutrition: Goal: Adequate nutrition will be maintained Outcome: Progressing   Problem: Coping: Goal: Level of anxiety will decrease Outcome: Progressing   Problem: Elimination: Goal: Will not experience complications related to bowel motility Outcome: Progressing Goal: Will not experience complications related to urinary retention Outcome: Progressing   Problem: Pain Managment: Goal: General experience of comfort will improve Outcome: Progressing   Problem: Safety: Goal: Ability to remain free from injury will improve Outcome: Progressing   Problem: Skin Integrity: Goal: Risk for impaired skin integrity will decrease Outcome: Progressing   Problem: Education: Goal: Ability  to describe self-care measures that may prevent or decrease complications (Diabetes Survival Skills Education) will improve Outcome: Progressing Goal: Individualized Educational Video(s) Outcome: Progressing   Problem: Coping: Goal: Ability to adjust to condition or change in health will improve Outcome: Progressing   Problem: Fluid Volume: Goal: Ability to maintain a balanced intake and output will improve Outcome: Progressing   Problem: Health Behavior/Discharge Planning: Goal: Ability to identify and utilize available resources and services will improve Outcome: Progressing Goal: Ability to manage health-related needs will improve Outcome: Progressing   Problem: Metabolic: Goal: Ability to maintain appropriate glucose levels will improve Outcome: Progressing   Problem: Nutritional: Goal: Maintenance of adequate nutrition will improve Outcome: Progressing Goal: Progress toward achieving an optimal weight will improve Outcome: Progressing   Problem: Skin Integrity: Goal: Risk for impaired skin integrity will decrease Outcome: Progressing   Problem: Tissue Perfusion: Goal: Adequacy of tissue perfusion will improve Outcome: Progressing   

## 2023-08-14 NOTE — Inpatient Diabetes Management (Signed)
Inpatient Diabetes Program Recommendations  AACE/ADA: New Consensus Statement on Inpatient Glycemic Control (2015)  Target Ranges:  Prepandial:   less than 140 mg/dL      Peak postprandial:   less than 180 mg/dL (1-2 hours)      Critically ill patients:  140 - 180 mg/dL   Lab Results  Component Value Date   GLUCAP 180 (H) 08/14/2023   HGBA1C 6.3 (H) 07/09/2023    Inpatient Diabetes Program Recommendations:   Met with patient and wife @ bedside and discussed option for CGM. Dr. Maren Beach in to see patient and agrees with plan to send patient home with continuous glucose monitor Libre 3.  Patient and wife's phones are not compatible with the application, so patient will need a reader on discharge # [phone]  Gave patient 2 sensors to take home to use with new reader when picks up from pharmacy. Reviewed with patient and wife how the sensor works and that will need to be changed every 2 weeks. Gave booklet for review.  Thank you, Billy Fischer. Carmisha Larusso, RN, MSN, CDE  Diabetes Coordinator Inpatient Glycemic Control Team Team Pager [phone] (8am-5pm) 08/14/2023 1:05 PM

## 2023-08-15 ENCOUNTER — Other Ambulatory Visit (HOSPITAL_COMMUNITY): Payer: Self-pay

## 2023-08-15 LAB — BASIC METABOLIC PANEL
Anion gap: 12 (ref 5–15)
BUN: 28 mg/dL — ABNORMAL HIGH (ref 8–23)
CO2: 25 mmol/L (ref 22–32)
Calcium: 9.1 mg/dL (ref 8.9–10.3)
Chloride: 102 mmol/L (ref 98–111)
Creatinine, Ser: 1.21 mg/dL (ref 0.61–1.24)
GFR, Estimated: >60 mL/min (ref >60)
Glucose, Bld: 136 mg/dL — ABNORMAL HIGH (ref 70–99)
Potassium: 4.2 mmol/L (ref 3.5–5.1)
Sodium: 139 mmol/L (ref 135–145)

## 2023-08-15 LAB — GLUCOSE, CAPILLARY
Glucose-Capillary: 126 mg/dL — ABNORMAL HIGH (ref 70–99)
Glucose-Capillary: 163 mg/dL — ABNORMAL HIGH (ref 70–99)

## 2023-08-15 LAB — PROTIME-INR
INR: 2.1 — ABNORMAL HIGH (ref 0.8–1.2)
Prothrombin Time: 23.8 seconds — ABNORMAL HIGH (ref 11.4–15.2)

## 2023-08-15 LAB — LACTATE DEHYDROGENASE: LDH: 161 U/L (ref 98–192)

## 2023-08-15 LAB — MAGNESIUM: Magnesium: 2 mg/dL (ref 1.7–2.4)

## 2023-08-15 MED ORDER — MINOCYCLINE HCL 100 MG PO CAPS: 200.0000 mg | ORAL_CAPSULE | Freq: Two times a day (BID) | ORAL | 11 refills | Status: DC

## 2023-08-15 MED ORDER — WARFARIN SODIUM 4 MG PO TABS: 4.0000 mg | ORAL_TABLET | ORAL | 11 refills | Status: DC

## 2023-08-15 MED ORDER — OXYCODONE HCL 10 MG PO TABS
10.0000 mg | ORAL_TABLET | Freq: Four times a day (QID) | ORAL | 0 refills | Status: DC | PRN
  Filled 2023-08-15: qty 28, 7d supply, fill #0

## 2023-08-15 MED ORDER — ROSUVASTATIN CALCIUM 20 MG PO TABS: 20.0000 mg | ORAL_TABLET | Freq: Every day | ORAL | 5 refills | Status: DC

## 2023-08-15 MED ORDER — SPIRONOLACTONE 25 MG PO TABS: 12.5000 mg | ORAL_TABLET | Freq: Every day | ORAL | 5 refills | Status: DC

## 2023-08-15 MED ORDER — GABAPENTIN 300 MG PO CAPS
300.0000 mg | ORAL_CAPSULE | Freq: Three times a day (TID) | ORAL | 0 refills | Status: DC
  Filled 2023-08-15: qty 90, 30d supply, fill #0

## 2023-08-15 MED ORDER — WARFARIN SODIUM 4 MG PO TABS
4.0000 mg | ORAL_TABLET | ORAL | 11 refills | Status: DC
Start: 2023-08-15 — End: 2023-10-02
  Filled 2023-08-15: qty 45, 30d supply, fill #0

## 2023-08-15 MED ORDER — LOSARTAN POTASSIUM 50 MG PO TABS: 50.0000 mg | ORAL_TABLET | Freq: Every day | ORAL | 5 refills | Status: DC

## 2023-08-15 MED ORDER — POTASSIUM CHLORIDE CRYS ER 20 MEQ PO TBCR
20.0000 meq | EXTENDED_RELEASE_TABLET | Freq: Once | ORAL | Status: AC
  Administered 2023-08-15: 20 meq via ORAL
  Filled 2023-08-15: qty 1

## 2023-08-15 MED ORDER — FUROSEMIDE 40 MG PO TABS
40.0000 mg | ORAL_TABLET | Freq: Once | ORAL | Status: AC
  Administered 2023-08-15: 40 mg via ORAL
  Filled 2023-08-15: qty 1

## 2023-08-15 MED ORDER — OXYCODONE HCL 10 MG PO TABS: 10.0000 mg | ORAL_TABLET | Freq: Four times a day (QID) | ORAL | 0 refills | Status: DC | PRN

## 2023-08-15 MED ORDER — METOPROLOL SUCCINATE ER 25 MG PO TB24: 25.0000 mg | ORAL_TABLET | Freq: Every day | ORAL | 5 refills | Status: DC

## 2023-08-15 MED ORDER — GABAPENTIN 300 MG PO CAPS: 300.0000 mg | ORAL_CAPSULE | Freq: Three times a day (TID) | ORAL | 0 refills | Status: DC

## 2023-08-15 MED ORDER — AMLODIPINE BESYLATE 10 MG PO TABS
10.0000 mg | ORAL_TABLET | Freq: Every day | ORAL | 5 refills | Status: DC
  Filled 2023-08-15: qty 30, 30d supply, fill #0

## 2023-08-15 MED ORDER — AMLODIPINE BESYLATE 10 MG PO TABS: 10.0000 mg | ORAL_TABLET | Freq: Every day | ORAL | 5 refills | Status: DC

## 2023-08-15 MED ORDER — LOSARTAN POTASSIUM 50 MG PO TABS
50.0000 mg | ORAL_TABLET | Freq: Every day | ORAL | 5 refills | Status: DC
  Filled 2023-08-15: qty 30, 30d supply, fill #0

## 2023-08-15 MED ORDER — SPIRONOLACTONE 25 MG PO TABS
12.5000 mg | ORAL_TABLET | Freq: Every day | ORAL | 5 refills | Status: DC
  Filled 2023-08-15: qty 30, 60d supply, fill #0

## 2023-08-15 MED ORDER — CIPROFLOXACIN HCL 750 MG PO TABS
750.0000 mg | ORAL_TABLET | Freq: Two times a day (BID) | ORAL | 0 refills | Status: DC
  Filled 2023-08-15: qty 28, 14d supply, fill #0

## 2023-08-15 MED ORDER — CIPROFLOXACIN HCL 750 MG PO TABS: 750.0000 mg | ORAL_TABLET | Freq: Two times a day (BID) | ORAL | 0 refills | Status: DC

## 2023-08-15 MED ORDER — TRAMADOL HCL 50 MG PO TABS: 100.0000 mg | ORAL_TABLET | Freq: Two times a day (BID) | ORAL | 0 refills | Status: DC | PRN

## 2023-08-15 MED ORDER — TRAMADOL HCL 50 MG PO TABS
100.0000 mg | ORAL_TABLET | Freq: Two times a day (BID) | ORAL | 0 refills | Status: DC | PRN
  Filled 2023-08-15: qty 120, 30d supply, fill #0

## 2023-08-15 NOTE — TOC Progression Note (Signed)
Transition of Care (TOC) - Progression Note    Patient Details  Name: Kevin Underwood. MRN: [id number] Date of Birth: 1961/07/10  Transition of Care [facility]) CM/SW Contact  [name], RN Phone Number: [phone] 08/15/2023, 2:02 PM  Clinical Narrative:   TOC CM spoke to pt and wife at bedside. HH arranged with [facility], notified rep pt was dc home. Contacted pt PCP for hospital follow up on 9/17 at 11 am. Wife to provide transportation home.     Expected Discharge Plan: Home w Home Health Services Barriers to Discharge: No Barriers Identified  Expected Discharge Plan and Services   Discharge Planning Services: CM Consult Post Acute Care Choice: Home Health Living arrangements for the past 2 months: Single Family Home Expected Discharge Date: 08/15/23                         HH Arranged: RN, PT   Date HH Agency Contacted: 08/15/23 Time HH Agency Contacted: 1402 Representative spoke with at [facility] Agency: Lorenza Chick   Social Determinants of Health (SDOH) Interventions SDOH Screenings   Food Insecurity: No Food Insecurity (07/08/2023)  Housing: Low Risk  (07/08/2023)  Transportation Needs: No Transportation Needs (07/08/2023)  Utilities: Not At Risk (07/08/2023)  Alcohol Screen: Low Risk  (11/10/2020)  Depression (PHQ2-9): Low Risk  (10/16/2022)  Financial Resource Strain: Medium Risk (05/17/2022)  Physical Activity: Inactive (11/10/2020)  Tobacco Use: Medium Risk (07/09/2023)    Readmission Risk Interventions    07/01/2022    9:05 AM 11/28/2020   11:57 AM  Readmission Risk Prevention Plan  Transportation Screening Complete Complete  Medication Review Oceanographer) Complete Complete  PCP or Specialist appointment within 3-5 days of discharge Complete   HRI or Home Care Consult Complete Complete  SW Recovery Care/Counseling Consult Complete Complete  Palliative Care Screening Not Applicable Not Applicable  Skilled Nursing Facility Not Applicable Not  Applicable

## 2023-08-15 NOTE — Progress Notes (Addendum)
Patient ID: Kevin Underwood., male   DOB: 1961-04-19, 62 y.o.   MRN: [id number]   Advanced Heart Failure VAD Team Note  PCP-Cardiologist: Nona Dell, MD  [facility]: Dr. Shirlee Latch   Subjective:    7/29: admitted w/ suspected DL fracture and DL infection. P/w "driveline communication fault" error message on LVAD. No pump stoppages. LVAD coordinator replaced modular cable and controller with resolution of driveline communication fault signal. CT C/A/P c/w prior imaging w/ mild circumferential thickening of the fat plane surrounding the drive line in the upper abdomen extending from the pump to the skin which may be chronic or represent an infectious process. No drainable fluid collection. Wound + BCx collected. Started on empiric per ID.  7/30: Wound Cx growing Pseudomonas Aeruginosa (pan-sensitive) and S. aureus.  7/31: OR for wound debridement + application of wound vac.   8/8: Returned to OR for I&D  Surgical wound with pseudomonas aeruginosa and MRSA. Bcx from 7/29 and 7/31 NG.   Completed IV Dapto + Cefepime. Now on Cipro 750 mg BID + Minocycline 200 mg BID for suppression.   INR 2.1.  MAPs mostly 80s-90s  In good spirits. No dyspnea. + LE edema and 3 lb weight gain overnight. Requesting dose of Lasix   LVAD INTERROGATION:  HeartMate III LVAD:   Flow 4.8 liters/min, speed 5600 power 4 PI  3.6  > 100 PI events this am. VAD interrogated personally.   Objective:    Vital Signs:   Temp:  [97.6 F (36.4 C)-98.8 F (37.1 C)] 97.6 F (36.4 C) (09/06 0722) Pulse Rate:  [73-82] 76 (09/06 0005) Resp:  [14-20] 19 (09/06 0557) BP: (89-131)/(67-104) 109/67 (09/06 0722) SpO2:  [96 %-100 %] 98 % (09/06 0722) Weight:  [96.8 kg] 96.8 kg (09/06 0557) Last BM Date : 08/13/23 Mean arterial Pressure 79  Intake/Output:   Intake/Output Summary (Last 24 hours) at 08/15/2023 0735 Last data filed at 08/15/2023 0600 Gross per 24 hour  Intake 1020 ml  Output --  Net 1020 ml    Physical Exam    Physical Exam: GENERAL: Well appearing HEENT: normal  NECK: Supple, JVP 8-9.  2+ bilaterally, no bruits.   CARDIAC:  Mechanical heart sounds with LVAD hum present.  LUNGS:  Clear to auscultation bilaterally.  ABDOMEN:  Soft, round, nontender, positive bowel sounds x4. + abdominal hernia. Wearing abdominal binder. LVAD exit site: Dressing dry and intact.  Stabilization device present and accurately applied.   EXTREMITIES:  Warm and dry, no cyanosis, clubbing, rash, 1-2 + edema  NEUROLOGIC:  Alert and oriented x 4.  Affect pleasant.       Telemetry   SR 70s  Labs   Basic Metabolic Panel: Recent Labs  Lab 08/11/23 0400 08/12/23 1139 08/13/23 0425 08/14/23 0420 08/15/23 0606  NA 135 136 137 135 139  K 4.5 4.3 4.1 4.1 4.2  CL 99 97* 100 100 102  CO2 27 28 25 23 25   GLUCOSE 127* 112* 159* 126* 136*  BUN 25* 23 32* 32* 28*  CREATININE 1.10 1.04 1.24 1.13 1.21  CALCIUM 9.2 9.6 9.1 8.8* 9.1  MG 2.1 2.0 2.0 2.0 2.0    Liver Function Tests: No results for input(s): "AST", "ALT", "ALKPHOS", "BILITOT", "PROT", "ALBUMIN" in the last 168 hours.  CBC: Recent Labs  Lab 08/09/23 0620 08/10/23 0337 08/11/23 0400 08/12/23 1139  WBC 6.8 6.2 7.5 8.3  HGB 10.1* 9.2* 9.9* 11.5*  HCT 31.2* 28.7* 31.1* 35.6*  MCV 84.6 86.2 84.5 82.4  PLT 259 227 237 287    INR: Recent Labs  Lab 08/11/23 0400 08/12/23 1139 08/13/23 0425 08/14/23 0420 08/15/23 0606  INR 2.0* 1.7* 1.9* 2.0* 2.1*    Other results:    Imaging   No results found.   Medications:     Scheduled Medications:  allopurinol  100 mg Oral Daily   amLODipine  10 mg Oral Daily   ciprofloxacin  750 mg Oral BID   clonazepam  0.5 mg Oral BID   docusate sodium  100 mg Oral BID   fluticasone furoate-vilanterol  1 puff Inhalation Daily   And   umeclidinium bromide  1 puff Inhalation Daily   gabapentin  300 mg Oral TID   insulin aspart  0-20 Units Subcutaneous TID WC   insulin aspart  0-5 Units Subcutaneous  QHS   insulin glargine-yfgn  12 Units Subcutaneous Daily   losartan  50 mg Oral Daily   metoprolol succinate  25 mg Oral Daily   minocycline  200 mg Oral BID   mirtazapine  15 mg Oral QHS   pantoprazole  40 mg Oral Daily   polyethylene glycol  17 g Oral Daily   sertraline  50 mg Oral Daily   sodium chloride flush  10-40 mL Intracatheter Q12H   spironolactone  12.5 mg Oral Daily   tamsulosin  0.4 mg Oral Daily   thiamine  100 mg Oral Daily   traZODone  150 mg Oral QHS   warfarin  6 mg Oral q1600   Warfarin - Pharmacist Dosing Inpatient   Does not apply q1600   zinc sulfate  220 mg Oral Daily    Infusions:    PRN Medications: acetaminophen, fluticasone, oxyCODONE, sodium chloride flush, traMADol   Patient Profile  62 y.o. 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 Now s/p Heartmate 3 LVAD. Now admitted w/ suspected DL fracture after multiple falls and recurrent DL infection.  Assessment/Plan:   1.  LVAD Driveline infection: MRSA driveline infection, admitted in 6/23 and again in 7/23-8/23 with multiple trips to the OR for debridement.  Cultures have grown acinetobacter and MRSA. He was readmitted with driveline infection in 2/24, completed course of daptomycin for MRSA. Now readmitted w/ suspected DL fracture and DL infection. P/w "driveline communication fault" error message on LVAD.LVAD coordinator replaced modular cable and controller with resolution of driveline communication fault signal. CT C/A/P c/w prior imaging w/ similar appearance of mild circumferential thickening of the fat plane surrounding the drive line in the upper abdomen extending from the pump to the skin which may be chronic or represent an infectious process. No drainable fluid collection.  -Initial wound Cx + for  Pseudomonas Aeruginosa (pan-sensitive) and S. aureus. Now on Dapto, Cefepime and minocycline. BC x 2 NG. Taken to OR 7/31 for wound debridement + application of wound vac. Returned to OR 8/8 for wound wash out/debridement - Completed 4 weeks IV antibiotics per ID. Plan PO minocycline and ciprofloxacin for 6 weeks course. EOT 08/28/23. Then can continue minocycline for suppression of MRSA along with Acinetobacter and reassess need of ciprofloxacin for PsA suppression.  - f/u appointment with Dr Thedore Mins 9/16. Appreciate ID's assistance    2. Suspected DL fracture with multiple recent falls - Has had multiple falls during the last 2 months (prior to admit), one direct  trauma to mid sternum (fell on cinder block) - CT chest/abd/pelvis per above  - CXR w/ LVAD partially imaged. The associated lead noted to be intact w/ no fracture or kink  - LVAD coordinators changed modular cable and controller with resolution  - stable  3.  Chronic systolic CHF s/p LVAD: Echo 10/21 with EF 20-25%, mildly decreased RV function. LHC/RHC in 12/21 with patent grafts, low output. Suspect mixed ischemic/nonischemic cardiomyopathy (prior heavy ETOH and drugs as well as CAD). Admitted with cardiogenic shock in 12/21, had placement of Impella 5.5 initially, now s/p Heartmate 3 LVAD on 11/17/20.  Ramp echo 2/23 with speed decreased to 5500 rpm. Speed now back to 5600 rpm.  LVAD parameters stable.  - Volume appears mildly elevated. Does not take regular diuretic. Will give 40 mg lasix PO. - Continue current Toprol XL 25 mg daily.  - Did not tolerate Entresto in the past d/t AKI and low MAPs.  - Continue Losartan 50 mg daily - Continue Spiro 12.5 mg daily - INR 2.1 and LDH stable  - VAD interrogated personally. Parameters stable.  4. CKD stage 3: Prior CTA abdomen/pelvis with high-grade stenosis of proximal right renal artery. No current need for intervention. -  SCr has been stable through admission  5.  CAD: S/p CABG 12/20.   LHC pre-VAD with patent grafts, no target for intervention. - Continue Crestor 20 mg daily.  - No s/s angina  6.  Atrial fibrillation: Paroxysmal.  S/p Maze and LA appendage clip with CABG in 12/20.  Now in NSR.  - Continue Toprol XL 25 mg daily.  - Off amiodarone with hyperthyroidism. - In NSR  7. Type 2 diabetes: improved control, Hgb A1c down from 12.1>>6.3  - continue SSI   8. 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 He says last use of methamphetamine was > 2 months ago. - UDS this admit negative (however collected > 48 hrs post admit)  - Declines substance use resources  9. 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  Followed by endocrinology. He is now off amiodarone. He had been on methimazole but has not been taking recently. TSH was high on admit and free T4 normal  - Will NOT restart methimazole   10. Anemia (chronic blood loss):  CBC has been stable.    11. COPD: Prior smoker.  - Continue Trelegy.   12. PVCs/NSVT:  - Now off amiodarone.  - Quiescent  13.  Depression: Has turned to substance abuse to cope.   - Has been referred to OP psych in [place]   14.  Gout: Continue allopurinol, he has prn colchicine.   15.  HTN:  - MAPs improved.  - Continue current doses of amlodipine, metoprolol xl, losartan, and spironolactone  16. Agitation: Klonopin increased, agitation improved  17. Incisional hernia: Large abdominal wall hernia in close proximity to driveline. Cannot tolerate wearing abdominal binder.  Has chronic discomfort from the hernia and the driveline.  It is not incarcerated.  - Continue oxycodone. Alternating with Tramadol. - No good option for hernia repair given driveline proximity and size of defect.  - No change  18. Constipation - Continue colace and miralax.    Discharge home today. Will discuss pain regimen with MD.  Has f/u in VAD clinic. Need to watch INR closely while on Cipro.  I reviewed the LVAD parameters  from today, and compared the results to the patient's prior recorded data.  No programming changes were made.  The LVAD is  functioning within specified parameters.  The patient performs LVAD self-test daily.  LVAD interrogation was negative for any significant power changes, alarms or PI events/speed drops.  LVAD equipment check completed and is in good working order.  Back-up equipment present.   LVAD education done on emergency procedures and precautions and reviewed exit site care.   Length of Stay: [address], LINDSAY N, PA-C 08/15/2023, 7:35 AM  VAD Team --- VAD ISSUES ONLY--- Pager [phone] (7am - 7am)  Advanced Heart Failure Team  Pager [phone] (M-F; 7a - 5p)  Please contact [facility] for night-coverage after hours (5p -7a ) and   Patient seen and examined with the above-signed Advanced Practice Provider and/or Housestaff. I personally reviewed laboratory data, imaging studies and relevant notes. I independently examined the patient and formulated the important aspects of the plan. I have edited the note to reflect any of my changes or salient points. I have personally discussed the plan with the patient and/or family.  Finished IV abx. Denies fevers or chills.   Hernia still bugging him. MAPs ok. INR 2.1  General:  NAD.  HEENT: normal  Neck: supple. JVP not elevated.  Carotids 2+ bilat; no bruits. No lymphadenopathy or thryomegaly appreciated. Cor: LVAD hum.  Lungs: Clear. Abdomen: obese soft + large hernia Driveline/wound dressing clean. Anchor in place.  Extremities: no cyanosis, clubbing, rash. Warm no edema  Neuro: alert & oriented x 3. No focal deficits. Moves all 4 without problem   He has completed IV abx. Home po regimen per ID.  MAPs and INR look good.   Will continue current pain meds until he has time to get to pain clinic.   Home today.  Arvilla Meres, MD  8:14 AM

## 2023-08-15 NOTE — Progress Notes (Signed)
ANTICOAGULATION CONSULT NOTE  Pharmacy Consult for warfarin  Indication:  LVAD  Allergies  Allergen Reactions   Vancomycin Nausea And Vomiting and Other (See Comments)    Severe joint pain    Patient Measurements: Height: 5\' 10"  (177.8 cm) Weight: 96.8 kg (213 lb 6.5 oz) IBW/kg (Calculated) : 73  Vital Signs: Temp: 97.6 F (36.4 C) (09/06 0722) Temp Source: Oral (09/06 0722) BP: 109/67 (09/06 0722) Pulse Rate: 76 (09/06 0722)  Labs: Recent Labs    08/12/23 1139 08/13/23 0425 08/14/23 0420 08/15/23 0606  HGB 11.5*  --   --   --   HCT 35.6*  --   --   --   PLT 287  --   --   --   LABPROT 20.5* 21.6* 22.9* 23.8*  INR 1.7* 1.9* 2.0* 2.1*  CREATININE 1.04 1.24 1.13 1.21  CKTOTAL 61  --   --   --     Estimated Creatinine Clearance: 73.9 mL/min (by C-G formula based on SCr of 1.21 mg/dL).   Medical History: Past Medical History:  Diagnosis Date   "    Arthritis    CAD (coronary artery disease)    a. s/p CABG in 11/2019 with LIMA-LAD, SVG-OM1, SVG-PDA and SVG-D1   CHF (congestive heart failure) (HCC)    a. EF < 20% by echo in 11/2019   Essential hypertension    PAF (paroxysmal atrial fibrillation) (HCC)    Type 2 diabetes mellitus (HCC)     Medications:  Scheduled:   allopurinol  100 mg Oral Daily   amLODipine  10 mg Oral Daily   ciprofloxacin  750 mg Oral BID   clonazepam  0.5 mg Oral BID   docusate sodium  100 mg Oral BID   fluticasone furoate-vilanterol  1 puff Inhalation Daily   And   umeclidinium bromide  1 puff Inhalation Daily   furosemide  40 mg Oral Once   gabapentin  300 mg Oral TID   insulin aspart  0-20 Units Subcutaneous TID WC   insulin aspart  0-5 Units Subcutaneous QHS   insulin glargine-yfgn  12 Units Subcutaneous Daily   losartan  50 mg Oral Daily   metoprolol succinate  25 mg Oral Daily   minocycline  200 mg Oral BID   mirtazapine  15 mg Oral QHS   pantoprazole  40 mg Oral Daily   polyethylene glycol  17 g Oral Daily   potassium  chloride  20 mEq Oral Once   sertraline  50 mg Oral Daily   sodium chloride flush  10-40 mL Intracatheter Q12H   spironolactone  12.5 mg Oral Daily   tamsulosin  0.4 mg Oral Daily   thiamine  100 mg Oral Daily   traZODone  150 mg Oral QHS   warfarin  6 mg Oral q1600   Warfarin - Pharmacist Dosing Inpatient   Does not apply q1600   zinc sulfate  220 mg Oral Daily    Assessment: 62 yom who presented to clinic with concern for driveline fracture and infection. Last [facility] appointment was on 04/21/2023 - INR was subtherapeutic at 1.4 and regimen at that time was 6 mg daily except 4 mg Tues/Thurs (LD 7/25). Given low INR on admit, started fixed rate heparin 500 units/hr - now stopped.   INR remains therapeutic at 2.1 - has been getting 6 mg daily recently. LDH stable at 163. Last CBC stable - Hgb 11.5, plt 287. LDH 163. No s/sx of bleeding.   Off cefepime/daptomycin -  plan to continue on ciprofloxacin and minocycline as outpatient > can increase INR sensitivity.   Goal of Therapy:  INR 2-2.5 Monitor platelets by anticoagulation protocol: Yes   Plan:  Would resume prior to admission dosing of warfarin 4mg  TT / 6mg  AOD given addition of cipro for chronic suppression for recent pseudomonal DLI per ID team Monitor daily INR, CBC, LDH, and for s/sx of bleeding   Thank you for allowing pharmacy to participate in this patient's care,  Sherron Monday, PharmD, BCCCP Clinical Pharmacist  Phone: [phone] 08/15/2023 8:16 AM  Please check AMION for all [facility] phone numbers After 10:00 PM, call Main Pharmacy [phone]

## 2023-08-15 NOTE — Progress Notes (Signed)
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 Started on IV antibiotics.   Patient presented to VAD clinic 7/29 for driveline communication fault- stating that he has had "alarms every couple hrs from his VAD for a couple months." Pt has no showed and cancelled all appointments in the past 2 months. Pt also had green/blue drainage visible through his driveline bandage. (Dressing change and wound cx obtained). Pt states that he has experienced dizziness associated with a fall multiple times over the past couple months but no signs of bleeding. Controller and modular cable exchanged. No further communication fault alarms noted.   CT Chest/Abd/Pelvis 7/29 1. Similar appearance of mild circumferential thickening of the fat plane surrounding the drive line in the upper abdomen extending from the pump to the skin which may be chronic or represent an infectious process. No drainable fluid collection.  Pt had drive line debridement in OR with placement of instillation wound vac on 07/09/23 with Dr Donata Clay. This was change to wet/dry on Friday 8/2 due to ongoing alarms related to the pts hernia.   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 Currently on IV Cefepime Q 8 hrs and IV Daptomycin 800 mg daily per ID team. IV antibiotic end date 9/5 per ID team. Pt is refusing to go a facility for IV antibiotics. Will discharge on PO Minocycline 200 mg BID (for MRSA and Acinetobacter suppression) and Ciprofloxacin 750 mg daily (for pseudomonas suppression) per ID. Has ID follow up appt scheduled 08/25/23 at 1:45 PM.   Sitting up on side of the bed. Seems to be in a good spirits today. He is looking forward to going home. Discussed referral to Pain Clinic- he is aware they will call him to schedule appointments.   VAD clinic follow up appt scheduled: 08/20/23 at 9 AM for INR and dressing change,  and 09/03/23 at 9 AM for hospital discharge f/u. VAD coordinator programmed VAD clinic and pager numbers into his new phone. Encouraged him to attend all follow up appts as they are imperative for his health. He verbalized understanding.   Vital signs: Temp: 97.6 HR: 76 Doppler Pressure: 90 Automatic BP: 109/67 (79) O2 Sat: 98% on RA Wt: 193.3>194.8>196.4>194.7>...>211.4>209.2>206.8>208.4>211.6>208.3>208.8>210.7>203.9>198.8>209>206.6>210.5>213.4 lbs  LVAD interrogation reveals:  Speed: 5600 Flow: 4.6 Power: 4.2 w PI: 2.6 Alarms: none Events: 100+ Hct: 31  Fixed speed:  5600 Low speed limit:  5300  Drive Line: Existing VAD dressing removed and site care performed using sterile technique. Wound bed cleansed with VASHE solution and sterile 4x4 gauze through light manual debridement. Skin surrounding wound bed cleaned with Chlora prep applicators x 2, allowed to dry. VASHE moistened 2 x 2s placed in wound bed, covered with several dry 4 x 4s. Wound tunnels approx 2 cm.  Drive line partially incorporated. Small amount of thick yellow/serosanguinous drainage noted. No redness, tenderness, foul odor or rash noted.  Drive line anchor reapplied. Continue daily wet to dry dressing changes using VASHE solution per bedside RN or VAD coordinator. Next dressing change due 08/16/23 by bedside nurse.      Labs:  LDH trend: 121>133>130>138>172>175>165>158>169>155>170>167>173>161>163>156>165>168>158>149>153>167>185>205>>153>159>170>163>161  INR trend: 1.0>1.1>1.2>1.4>1.3>1.4>1.5>1.4>1.8>1.9>1.7>1.8>1.9>2.1>2.2>1.2>2.1>2.6>2.2>2.1>2.1>2>2.1>2.0>1.9>2.0>2.1  Anticoagulation Plan: -INR Goal: 2.0 - 2.5 -ASA Dose: none  ICD: N/A  Infection:  07/07/23>> blood cultures>> no growth 5 days; final  07/07/23>> wound cx>> few pseudomonas aeruginosa and MRSA; final  07/09/23>> OR driveline cx>> Few pseudomonas aeruginosa and METHICILLIN RESISTANT STAPHYLOCOCCUS AUREUS  07/09/23>> blood cultures>>no growth x 5  days; final  Drips:    Plan/Recommendations:  Page VAD coordinator with equipment issues or driveline problems Daily drive line wet to dry dressing changes with VASHE solution.   Alyce Pagan RN VAD Coordinator  Office: [phone]  24/7 Pager: [phone]

## 2023-08-15 NOTE — Progress Notes (Signed)
All set for discharge awaiting  meds from [facility] pharmacy.

## 2023-08-20 ENCOUNTER — Other Ambulatory Visit (HOSPITAL_COMMUNITY): Payer: Self-pay | Admitting: *Deleted

## 2023-08-20 ENCOUNTER — Other Ambulatory Visit (HOSPITAL_COMMUNITY): Payer: 59

## 2023-08-20 ENCOUNTER — Telehealth (HOSPITAL_COMMUNITY): Payer: Self-pay | Admitting: *Deleted

## 2023-08-20 DIAGNOSIS — Z95811 Presence of heart assist device: Secondary | ICD-10-CM

## 2023-08-20 DIAGNOSIS — Z7901 Long term (current) use of anticoagulants: Secondary | ICD-10-CM

## 2023-08-20 NOTE — Telephone Encounter (Signed)
Received call from [facility] this morning stating pt is refusing to come to clinic for INR and wound care. Reports he has fallen multiple times without injury while intoxicated since discharge. States he is taking his medications/antibiotics and allowing her to change his dressing. Requesting refills of Trazodone and Klonopin.   Discussed the above with Dr Shirlee Latch. Will not refill Trazodone and Klonopin at this time.  Felicity Pellegrini to call for wound care appt when pt is willing to come. She verbalized understanding. Has hospital discharge f/u appt scheduled 09/03/23 at 9 AM.   Alyce Pagan RN VAD Coordinator  Office: [phone]  24/7 Pager: [phone]

## 2023-08-25 ENCOUNTER — Inpatient Hospital Stay: Payer: 59 | Admitting: Internal Medicine

## 2023-08-26 ENCOUNTER — Other Ambulatory Visit (HOSPITAL_COMMUNITY): Payer: Self-pay | Admitting: *Deleted

## 2023-08-26 ENCOUNTER — Encounter (HOSPITAL_COMMUNITY): Payer: Self-pay | Admitting: Cardiology

## 2023-08-26 ENCOUNTER — Ambulatory Visit (HOSPITAL_COMMUNITY): Payer: Self-pay | Admitting: Pharmacist

## 2023-08-26 ENCOUNTER — Ambulatory Visit (HOSPITAL_COMMUNITY)
Admission: RE | Admit: 2023-08-26 | Discharge: 2023-08-26 | Disposition: A | Discharge status: Home / Self Care | Payer: 59 | Source: Ambulatory Visit | Attending: Cardiology | Admitting: Cardiology

## 2023-08-26 ENCOUNTER — Other Ambulatory Visit (HOSPITAL_COMMUNITY): Payer: 59

## 2023-08-26 DIAGNOSIS — T827XXA Infection and inflammatory reaction due to other cardiac and vascular devices, implants and grafts, initial encounter: Secondary | ICD-10-CM

## 2023-08-26 DIAGNOSIS — E1122 Type 2 diabetes mellitus with diabetic chronic kidney disease: Secondary | ICD-10-CM | POA: Insufficient documentation

## 2023-08-26 DIAGNOSIS — N183 Chronic kidney disease, stage 3 unspecified: Secondary | ICD-10-CM | POA: Diagnosis not present

## 2023-08-26 DIAGNOSIS — F17210 Nicotine dependence, cigarettes, uncomplicated: Secondary | ICD-10-CM | POA: Diagnosis not present

## 2023-08-26 DIAGNOSIS — I5022 Chronic systolic (congestive) heart failure: Secondary | ICD-10-CM | POA: Diagnosis not present

## 2023-08-26 DIAGNOSIS — Z7901 Long term (current) use of anticoagulants: Secondary | ICD-10-CM | POA: Diagnosis not present

## 2023-08-26 DIAGNOSIS — I2510 Atherosclerotic heart disease of native coronary artery without angina pectoris: Secondary | ICD-10-CM | POA: Diagnosis not present

## 2023-08-26 DIAGNOSIS — M109 Gout, unspecified: Secondary | ICD-10-CM | POA: Insufficient documentation

## 2023-08-26 DIAGNOSIS — J449 Chronic obstructive pulmonary disease, unspecified: Secondary | ICD-10-CM | POA: Insufficient documentation

## 2023-08-26 DIAGNOSIS — Z95811 Presence of heart assist device: Secondary | ICD-10-CM

## 2023-08-26 DIAGNOSIS — Z792 Long term (current) use of antibiotics: Secondary | ICD-10-CM | POA: Diagnosis not present

## 2023-08-26 DIAGNOSIS — Z951 Presence of aortocoronary bypass graft: Secondary | ICD-10-CM | POA: Diagnosis not present

## 2023-08-26 DIAGNOSIS — I480 Paroxysmal atrial fibrillation: Secondary | ICD-10-CM | POA: Diagnosis not present

## 2023-08-26 DIAGNOSIS — D509 Iron deficiency anemia, unspecified: Secondary | ICD-10-CM | POA: Diagnosis not present

## 2023-08-26 DIAGNOSIS — I130 Hypertensive heart and chronic kidney disease with heart failure and stage 1 through stage 4 chronic kidney disease, or unspecified chronic kidney disease: Secondary | ICD-10-CM | POA: Insufficient documentation

## 2023-08-26 DIAGNOSIS — Z4509 Encounter for adjustment and management of other cardiac device: Secondary | ICD-10-CM | POA: Diagnosis present

## 2023-08-26 DIAGNOSIS — T827XXS Infection and inflammatory reaction due to other cardiac and vascular devices, implants and grafts, sequela: Secondary | ICD-10-CM | POA: Diagnosis not present

## 2023-08-26 LAB — BASIC METABOLIC PANEL
Anion gap: 13 (ref 5–15)
BUN: 30 mg/dL — ABNORMAL HIGH (ref 8–23)
CO2: 22 mmol/L (ref 22–32)
Calcium: 9.4 mg/dL (ref 8.9–10.3)
Chloride: 103 mmol/L (ref 98–111)
Creatinine, Ser: 1.19 mg/dL (ref 0.61–1.24)
GFR, Estimated: >60 mL/min (ref >60)
Glucose, Bld: 112 mg/dL — ABNORMAL HIGH (ref 70–99)
Potassium: 4.5 mmol/L (ref 3.5–5.1)
Sodium: 138 mmol/L (ref 135–145)

## 2023-08-26 LAB — CBC
HCT: 38 % — ABNORMAL LOW (ref 39.0–52.0)
Hemoglobin: 12.4 g/dL — ABNORMAL LOW (ref 13.0–17.0)
MCH: 26.8 pg (ref 26.0–34.0)
MCHC: 32.6 g/dL (ref 30.0–36.0)
MCV: 82.1 fL (ref 80.0–100.0)
Platelets: 271 10*3/uL (ref 150–400)
RBC: 4.63 MIL/uL (ref 4.22–5.81)
RDW: 14.3 % (ref 11.5–15.5)
WBC: 10.6 10*3/uL — ABNORMAL HIGH (ref 4.0–10.5)
nRBC: 0 % (ref 0.0–0.2)

## 2023-08-26 LAB — LACTATE DEHYDROGENASE: LDH: 180 U/L (ref 98–192)

## 2023-08-26 LAB — PROTIME-INR
INR: 1.7 — ABNORMAL HIGH (ref 0.8–1.2)
Prothrombin Time: 19.8 s — ABNORMAL HIGH (ref 11.4–15.2)

## 2023-08-26 NOTE — Progress Notes (Addendum)
Patient presents for hospital d/c f/u in VAD clinic with wife [name]. Denies any issues with VAD equipment or drive line.   Pt ambulated independently into clinic. Denies complaints today. Reports occasional lightheadedness and dizziness. States he is not drinking 2L daily. 100+ PI events on VAD interrogation. Advised again to increase PO intake. He verbalized understanding. Denies shortness of breath, heart failure symptoms, and signs of bleeding. Reports he has had a few falls. States once he tripped and fell, and another occasion he "passed out" because he was hot. Denies injuries. He is wearing abdominal binder, and denies hernia pain.   Pt and wife report compliance with Minocycline 200 mg BID and Cipro 750 mg BID. Tolerating well without side effects. Denies missing any doses. [facility] reports she is changing dressing once (sometimes twice per pt preference) daily. Pt no-showed ID appt 9/16. Cipro end-date 9/19. VAD coordinator will reach out to ID team to determine plan for continuing Cipro vs discontinuing.   Will schedule ramp echo with next f/u visit per Dr Shirlee Latch.   Dressing changed today in clinic by Dr Donata Clay. See documentation below.   Vital Signs:  Doppler Pressure: 110 Automatic BP: 120/88 (100) HR: 94 SR SPO2: UTO%    Weight: 211.8 lb w/ ept Last weight: 213.4 lb w/ ept  VAD Indication: Destination Therapy. HM III implanted 11/17/20 by Dr Vickey Sages   VAD interrogation: Speed: 5600 Flow: 4.6 Power: 4.3 w    PI: 3.5 Hct: 31  Alarms: none  Events: 100+  Fixed speed: 5500 Low speed limit: 5200  Primary controller: replace back up battery in 30 months Back up controller: replace back up battery in 17 months--did not bring   I reviewed the LVAD parameters from today and compared the results to the patient's prior recorded data. LVAD interrogation was NEGATIVE for significant power changes, NEGATIVE for clinical alarms and STABLE for PI events/speed drops. No programming  changes were made and pump is functioning within specified parameters. Pt is performing daily controller and system monitor self tests along with completing weekly and monthly maintenance for LVAD equipment.   LVAD equipment check completed and is in good working order. Back-up equipment not present.    Annual Equipment Maintenance on UBC/PM was performed today on 01/15/2023.  Exit Site Care:  Wound care per Dr Donata Clay. Existing VAD dressing removed and site care performed using sterile technique. Wound bed cleansed with VASHE solution and sterile 4x4 gauze through light manual debridement. Skin surrounding wound bed cleaned with Chlora prep applicators x 2, allowed to dry. VASHE moistened 2 x 2s packed in wound bed, covered with several dry 4 x 4s. Hypergranulation tissue excised by Dr Donata Clay. Drive line partially incorporated. Small amount of serosanguinous drainage noted. No redness, tenderness, foul odor or rash noted.  Drive line anchor reapplied.   Continue daily sterile wet to dry dressing changes using VASHE solution per [facility]. Per [facility] they have plenty of VASHE at home. Provided with 14 daily kits, 15 anchors, 4 boxes of large tegaderms, cotton tip applicators, a box of 2 x 2 gauze, and a box of 4 x 4 gauze for home use.   Per Dr Donata Clay will have pt come to clinic every 2 weeks for wound care to monitor progression of wound healing. Pt and Tracey verbalized understanding.       BP & Labs:  Doppler 110- Doppler reflecting modified systolic   Hgb 12.4- No S/S of bleeding. Specifically denies melena/BRBPR or nosebleeds.  LDH 180-  with established baseline of 190 - 360. Denies tea-colored urine. No power elevations noted on interrogation.    Plan: No medication changes Coumadin dosing per Lauren PharmD Return to clinic in 2 weeks for wound care with Dr Donata Clay Return to clinic in 2 months for follow up with Dr Shirlee Latch Continue daily drive line dressing changes using  VASHE solution  Alyce Pagan RN VAD Coordinator  Office: [phone]  24/7 Pager: [phone]   HF cardiologist: Dr. Shirlee Latch  Follow up for Heart Failure/LVAD:  62 y.o. 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 type 2 diabetes mellitus, COPD, and hypertension. Now s/p Heartmate 3 LVAD.   Patient had echo in 10/21 with EF 20-25%.  He was admitted in 12/21 with cardiogenic shock.  Cath showed patent grafts and low cardiac output.  Suspect mixed ischemic/nonischemic cardiomyopathy, prior heavy ETOH though no ETOH or smoking since 12/20 CABG.  He required stabilization with Impella 5.5 and ultimately had placement of Heartmate 3 LVAD.  Paroxysmal atrial fibrillation noted post-op, but generally stable course and discharged home.    Patient was admitted in 10/22 with COPD exacerbation.  [name] was stopped with frequent PIs.  He was treated with steroids, nebs, antibiotics with improvement.   Ramp echo was done in 2/23.  At 5600 rpm the LV was small and the septum with some left shift, aortic valve opening 3/5 beats.  At 5500 rpm, the LV was larger and septum more midline, aortic valve still opening 3/5 beats.  The RV was moderately enlarged with normal systolic function. I left speed at 5500 rpm.   Patient had a prolonged admission in 6/23 and again in 7/23-8/23 with MRSA driveline infection.  He went to the OR multiple times for debridement and wound vac changes.  He completed vancomycin and was placed on doxycycline.   Levofloxacin added d/t increased drainage and redness after last f/u on 09/04/22. Wound culture grew acinetobacter.  Wound initially improved but cultures repeated d/t decreased healing over last few dressing changes. Levofloxacin recently stopped and back on doxy.  Grew acinetobacter and MRSA on culture. ICD  recommended switching to minocycline 200 BID based on culture results.  Patient was admitted with swollen and erythematous right wrist in 10/23.  There was concern for septic arthritis. CT showed no effusion that could be tapped.  Suspect this was likely a gout flare as it improved rapidly with colchicine.    Patient was admitted with recurrent MRSA driveline site infection in 2/24.  He went to the OR several times for debridement and wound vac management. BP and creatinine labile, we ended up stopping Lasix and anti-hypertensives except for Toprol XL.   Patient was admitted in 7/24 with driveline infection, underwent debridement.  Site cultures grew Pseudomonas and MSSA.  He completed a long-term course of IV abx.   He returns for LVAD followup.  He is on chronic minocycline and ciprofloxacin.  He is smoking 2-3 cigarettes/day, says this keeps him from using other drugs.  No abdominal pain, wearing abdominal binder for large ventral hernia.  MAP around 100 today, gets dizzy when we try to lower his BP more aggressively.  He has chronic dyspnea with moderate exertion, no changes.  Weight stable. Still with frequent PI events (this is chronic for him).  Driveline site is healing.   Labs (10/23): K  4.4, creatinine 0.94, hgb 10.9, LDH 145 Labs (3/24): creatinine 1.23 Labs (4/24): K 4.8, creatinine 1.25 Labs (5/24): WBCs 17.6, hgb 13 Labs (9/24): K 4.2, creatinine 1.21, LDH 161   Past Medical History:  Diagnosis Date   "    Arthritis    CAD (coronary artery disease)    a. s/p CABG in 11/2019 with LIMA-LAD, SVG-OM1, SVG-PDA and SVG-D1   CHF (congestive heart failure) (HCC)    a. EF < 20% by echo in 11/2019   Essential hypertension    PAF (paroxysmal atrial fibrillation) (HCC)    Type 2 diabetes mellitus (HCC)     Current Outpatient Medications  Medication Sig Dispense Refill   acetaminophen (TYLENOL) 325 MG tablet Take 2 tablets (650 mg total) by mouth every 4 (four) hours as needed for  headache or mild pain.     albuterol (PROVENTIL) (2.5 MG/3ML) 0.083% nebulizer solution INHALE 3 ML BY NEBULIZATION EVERY 6 HOURS AS NEEDED FOR WHEEZING OR SHORTNESS OF BREATH (Patient taking differently: Take 2.5 mg by nebulization every 6 (six) hours as needed for shortness of breath.) 450 mL 1   albuterol (VENTOLIN HFA) 108 (90 Base) MCG/ACT inhaler Inhale 2 puffs into the lungs every 6 (six) hours as needed for wheezing or shortness of breath.     allopurinol (ZYLOPRIM) 100 MG tablet Take 1 tablet (100 mg total) by mouth daily. 90 tablet 3   amLODipine (NORVASC) 10 MG tablet Take 1 tablet (10 mg total) by mouth daily. 30 tablet 5   ciprofloxacin (CIPRO) 750 MG tablet Take 1 tablet (750 mg total) by mouth 2 (two) times daily. 28 tablet 0   colchicine 0.6 MG tablet Take 1 tablet (0.6 mg total) by mouth daily as needed (gout pain).     COMBIVENT RESPIMAT 20-100 MCG/ACT AERS respimat Inhale 1 puff into the lungs every 6 (six) hours.     fluticasone (FLONASE ALLERGY RELIEF) 50 MCG/ACT nasal spray Place 2 sprays into both nostrils as needed for allergies or rhinitis.     gabapentin (NEURONTIN) 300 MG capsule Take 1 capsule (300 mg total) by mouth 3 (three) times daily. 90 capsule 0   glipiZIDE (GLUCOTROL) 5 MG tablet Take 1 tablet (5 mg total) by mouth daily. 30 tablet 11   guaifenesin (HUMIBID E) 400 MG TABS tablet Take 0.4 mg by mouth every 6 (six) hours as needed (for congestion).     loratadine (CLARITIN) 10 MG tablet Take 1 tablet (10 mg total) by mouth daily. 30 tablet 6   losartan (COZAAR) 50 MG tablet Take 1 tablet (50 mg total) by mouth daily. 30 tablet 5   metFORMIN (GLUCOPHAGE) 1000 MG tablet Take 1 tablet (1,000 mg total) by mouth 2 (two) times daily. (Patient taking differently: Take 500 mg by mouth 2 (two) times daily.) 120 tablet 6   metoprolol succinate (TOPROL XL) 25 MG 24 hr tablet Take 1 tablet (25 mg total) by mouth daily. 30 tablet 5   minocycline (MINOCIN) 100 MG capsule Take 2  capsules (200 mg total) by mouth 2 (two) times daily. 60 capsule 11   mirtazapine (REMERON) 15 MG tablet Take 1 tablet (15 mg total) by mouth at bedtime.     Multiple Vitamin (MULTIVITAMIN WITH MINERALS) TABS tablet Take 1 tablet by mouth daily.     Multiple Vitamin (MULTIVITAMIN) tablet Take 1 tablet by mouth daily.     Oxycodone HCl 10 MG TABS Take 1 tablet (10 mg total) by mouth every 6 (six) hours as  needed. 28 tablet 0   pantoprazole (PROTONIX) 40 MG tablet Take 1 tablet (40 mg total) by mouth daily. 90 tablet 2   rosuvastatin (CRESTOR) 20 MG tablet Take 1 tablet (20 mg total) by mouth daily. 30 tablet 5   sertraline (ZOLOFT) 50 MG tablet Take 1 tablet (50 mg total) by mouth daily. 90 tablet 3   spironolactone (ALDACTONE) 25 MG tablet Take 0.5 tablets (12.5 mg total) by mouth daily. 30 tablet 5   tamsulosin (FLOMAX) 0.4 MG CAPS capsule Take 1 capsule (0.4 mg total) by mouth daily. 90 capsule 3   thiamine (VITAMIN B-1) 100 MG tablet Take 1 tablet (100 mg total) by mouth daily.     traMADol (ULTRAM) 50 MG tablet Take 2 tablets (100 mg total) by mouth every 12 (twelve) hours as needed. 120 tablet 0   TRELEGY ELLIPTA 100-62.5-25 MCG/ACT AEPB Inhale 1 puff into the lungs daily. 1 each 6   warfarin (COUMADIN) 4 MG tablet Take 1-1.5 tablets (4-6 mg total) by mouth See admin instructions. Take 1 tablet (4 mg) every Tuesday and Thursday, Take 1.5 tabs (6 mg) all other days 45 tablet 11   clonazePAM (KLONOPIN) 0.25 MG disintegrating tablet Take 1 tablet (0.25 mg total) by mouth 2 (two) times daily. (Patient not taking: Reported on 08/26/2023) 60 tablet 1   traZODone (DESYREL) 150 MG tablet Take 1 tablet (150 mg total) by mouth at bedtime. (Patient not taking: Reported on 08/26/2023) 90 tablet 3   zinc sulfate 220 (50 Zn) MG capsule Take 1 capsule (220 mg total) by mouth daily. (Patient not taking: Reported on 07/07/2023)     No current facility-administered medications for this encounter.     Vancomycin  REVIEW OF SYSTEMS: All systems negative except as listed in HPI, PMH and Problem list.   LVAD INTERROGATION:  Please see LVAD nurse's note for interrogation.   I reviewed the LVAD parameters from today, and compared the results to the patient's prior recorded data.  No programming changes were made.  The LVAD is functioning within specified parameters.  The patient performs LVAD self-test daily.  LVAD interrogation was negative for any significant power changes, alarms or PI events/speed drops.  LVAD equipment check completed and is in good working order.  Back-up equipment present.   LVAD education done on emergency procedures and precautions and reviewed exit site care.    Vitals:   08/26/23 0910 08/26/23 1112  BP: (!) 110/0 120/88  Pulse: 94   Weight: 96.1 kg (211 lb 12.8 oz)   MAP 100  Physical Exam: General: Well appearing this am. NAD.  HEENT: Normal. Neck: Supple, JVP 7-8 cm. Carotids OK.  Cardiac:  Mechanical heart sounds with LVAD hum present.  Lungs:  CTAB, normal effort.  Abdomen:  NT, ND, no HSM. Large ventral hernia. +BS  LVAD exit site: Well-healed and incorporated. Dressing dry and intact. No erythema or drainage. Stabilization device present and accurately applied. Driveline dressing changed daily per sterile technique. Extremities:  Warm and dry. No cyanosis, clubbing, rash, or edema.  Neuro:  Alert & oriented x 3. Cranial nerves grossly intact. Moves all 4 extremities w/o difficulty. Affect pleasant    ASSESSMENT AND PLAN: 1.  Gout: Continue allopurinol, he has prn colchicine.  2.  Driveline infection: MRSA driveline infection in 6/23 and again in 7/23-8/23 with multiple trips to the OR for debridement.  He also grew acinetobacter during this time. He was readmitted with driveline infection in 2/24, completed course of daptomycin for  MRSA. Long admission with debridement in 7/24-9/24, on IV antibiotics for about 6 wks with cultures growing MRSA and  pseudomonas.  Now on chronic ciprofloxacin and minocycline. Driveline site is healing.  - Continue ciprofloxacin and minocycline as directed by ID clinic. .    3.  Chronic systolic CHF: Echo 10/21 with EF 20-25%, mildly decreased RV function. LHC/RHC in 12/21 with patent grafts, low output. Suspect mixed ischemic/nonischemic cardiomyopathy (prior heavy ETOH and drugs as well as CAD).  No ETOH, drugs, smoking since CABG in 12/20. Admitted with cardiogenic shock in 12/21, had placement of Impella 5.5 initially, now s/p Heartmate 3 LVAD on 11/17/20.  Ramp echo 2/23 with speed decreased to 5500 rpm. He would eventually like heart transplant. Speed now back to 5600 rpm.  MAP is labile, high today.  Gets lightheaded when we try to aggressively control MAP.  He is not volume overloaded on exam.  Has chronic frequent PI events, HTN likely contributes to this.  - Continue current Toprol XL 25 mg daily.  - He does not need a diuretic.   - Continue spironolactone 12.5 mg daily.  - Continue amlodipine 10 mg daily.  - Continue warfarin, INR goal 2-2.5.  4. 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  - Would consider eventual reassessment of renal artery stenosis  - BMET today.  5.  CAD: S/p CABG 12/20.  LHC pre-VAD with patent grafts, no target for intervention. - Continue Crestor 20 mg daily.  6.  Atrial fibrillation: Paroxysmal.  S/p Maze and LA appendage clip with CABG in 12/20. Maintaining SR.  - Continue Toprol XL 25 mg daily.  - Off amiodarone with hyperthyroidism. 7. Type 2 diabetes: Per PCP.  8. 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 Strongly encouraged abstinence.  9. 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  Followed by endocrinology. He is now off amiodarone and has stopped methimazole.  - Needs endocrinology followup, missed last appointment and will have to reschedule.   - Check TSH, free T3, free T4 today.   10. Anemia (chronic blood loss): CBC today.   11. COPD: Smoking 2-3 cigarettes/day, says he needs to continue this to stay off methamphetamine.   - Continue Trelegy.  - Needs followup with pulmonary, we have referred him to [facility] pulmonary in [place].  12. PVCs/NSVT: Now off amiodarone.   Followup 2 months with ramp echo at next visit.   Marca Ancona 08/26/2023

## 2023-08-26 NOTE — Patient Instructions (Signed)
No medication changes Coumadin dosing per Lauren PharmD Return to clinic in 2 weeks for wound care with Dr Donata Clay Return to clinic in 2 months for follow up with Dr Shirlee Latch Continue daily drive line dressing changes using VASHE solution

## 2023-08-27 ENCOUNTER — Telehealth (HOSPITAL_COMMUNITY): Payer: Self-pay | Admitting: *Deleted

## 2023-08-27 NOTE — Telephone Encounter (Signed)
Pt currently taking Minocycline 200 mg BID and Cipro 750 mg BID. Cipro end-date 08/28/23. Discussed plan with Dr Thedore Mins with ID team. Will continue Cipro for now until pt has follow up in ID office. Will reach out to pt to discuss re-scheduling ID follow up appt.   Alyce Pagan RN VAD Coordinator  Office: [phone]  24/7 Pager: [phone]

## 2023-09-03 ENCOUNTER — Encounter (HOSPITAL_COMMUNITY): Payer: 59 | Admitting: Cardiology

## 2023-09-03 ENCOUNTER — Other Ambulatory Visit (HOSPITAL_COMMUNITY): Payer: Self-pay

## 2023-09-03 DIAGNOSIS — Z7901 Long term (current) use of anticoagulants: Secondary | ICD-10-CM

## 2023-09-03 DIAGNOSIS — Z95811 Presence of heart assist device: Secondary | ICD-10-CM

## 2023-09-08 ENCOUNTER — Inpatient Hospital Stay (HOSPITAL_COMMUNITY)
Admission: EM | Admit: 2023-09-08 | Discharge: 2023-09-10 | DRG: 314 | Disposition: A | Discharge status: Home / Self Care | Payer: 59 | Attending: Cardiology | Admitting: Cardiology

## 2023-09-08 ENCOUNTER — Other Ambulatory Visit: Payer: Self-pay

## 2023-09-08 ENCOUNTER — Emergency Department (HOSPITAL_COMMUNITY): Payer: 59

## 2023-09-08 ENCOUNTER — Inpatient Hospital Stay (HOSPITAL_COMMUNITY): Payer: 59

## 2023-09-08 ENCOUNTER — Encounter (HOSPITAL_COMMUNITY): Payer: Self-pay

## 2023-09-08 DIAGNOSIS — J449 Chronic obstructive pulmonary disease, unspecified: Secondary | ICD-10-CM | POA: Diagnosis present

## 2023-09-08 DIAGNOSIS — D500 Iron deficiency anemia secondary to blood loss (chronic): Secondary | ICD-10-CM | POA: Diagnosis present

## 2023-09-08 DIAGNOSIS — Z95811 Presence of heart assist device: Secondary | ICD-10-CM | POA: Diagnosis not present

## 2023-09-08 DIAGNOSIS — Z825 Family history of asthma and other chronic lower respiratory diseases: Secondary | ICD-10-CM

## 2023-09-08 DIAGNOSIS — R6510 Systemic inflammatory response syndrome (SIRS) of non-infectious origin without acute organ dysfunction: Principal | ICD-10-CM

## 2023-09-08 DIAGNOSIS — Z8619 Personal history of other infectious and parasitic diseases: Secondary | ICD-10-CM | POA: Diagnosis not present

## 2023-09-08 DIAGNOSIS — A4902 Methicillin resistant Staphylococcus aureus infection, unspecified site: Secondary | ICD-10-CM | POA: Diagnosis not present

## 2023-09-08 DIAGNOSIS — Z5982 Transportation insecurity: Secondary | ICD-10-CM

## 2023-09-08 DIAGNOSIS — R262 Difficulty in walking, not elsewhere classified: Secondary | ICD-10-CM | POA: Diagnosis present

## 2023-09-08 DIAGNOSIS — Z8249 Family history of ischemic heart disease and other diseases of the circulatory system: Secondary | ICD-10-CM

## 2023-09-08 DIAGNOSIS — F1510 Other stimulant abuse, uncomplicated: Secondary | ICD-10-CM | POA: Diagnosis present

## 2023-09-08 DIAGNOSIS — N183 Chronic kidney disease, stage 3 unspecified: Secondary | ICD-10-CM | POA: Diagnosis not present

## 2023-09-08 DIAGNOSIS — Z7984 Long term (current) use of oral hypoglycemic drugs: Secondary | ICD-10-CM

## 2023-09-08 DIAGNOSIS — I480 Paroxysmal atrial fibrillation: Secondary | ICD-10-CM | POA: Diagnosis present

## 2023-09-08 DIAGNOSIS — E1122 Type 2 diabetes mellitus with diabetic chronic kidney disease: Secondary | ICD-10-CM | POA: Diagnosis present

## 2023-09-08 DIAGNOSIS — Z79899 Other long term (current) drug therapy: Secondary | ICD-10-CM | POA: Diagnosis not present

## 2023-09-08 DIAGNOSIS — E860 Dehydration: Secondary | ICD-10-CM | POA: Diagnosis present

## 2023-09-08 DIAGNOSIS — Z888 Allergy status to other drugs, medicaments and biological substances status: Secondary | ICD-10-CM

## 2023-09-08 DIAGNOSIS — I130 Hypertensive heart and chronic kidney disease with heart failure and stage 1 through stage 4 chronic kidney disease, or unspecified chronic kidney disease: Secondary | ICD-10-CM | POA: Diagnosis present

## 2023-09-08 DIAGNOSIS — Z794 Long term (current) use of insulin: Secondary | ICD-10-CM

## 2023-09-08 DIAGNOSIS — N182 Chronic kidney disease, stage 2 (mild): Secondary | ICD-10-CM | POA: Diagnosis present

## 2023-09-08 DIAGNOSIS — K432 Incisional hernia without obstruction or gangrene: Secondary | ICD-10-CM | POA: Diagnosis present

## 2023-09-08 DIAGNOSIS — A4102 Sepsis due to Methicillin resistant Staphylococcus aureus: Secondary | ICD-10-CM | POA: Diagnosis present

## 2023-09-08 DIAGNOSIS — I5022 Chronic systolic (congestive) heart failure: Secondary | ICD-10-CM | POA: Diagnosis present

## 2023-09-08 DIAGNOSIS — A419 Sepsis, unspecified organism: Principal | ICD-10-CM | POA: Diagnosis present

## 2023-09-08 DIAGNOSIS — T827XXA Infection and inflammatory reaction due to other cardiac and vascular devices, implants and grafts, initial encounter: Secondary | ICD-10-CM | POA: Diagnosis not present

## 2023-09-08 DIAGNOSIS — T462X5A Adverse effect of other antidysrhythmic drugs, initial encounter: Secondary | ICD-10-CM | POA: Diagnosis present

## 2023-09-08 DIAGNOSIS — Z7901 Long term (current) use of anticoagulants: Secondary | ICD-10-CM

## 2023-09-08 DIAGNOSIS — R5383 Other fatigue: Secondary | ICD-10-CM | POA: Diagnosis not present

## 2023-09-08 DIAGNOSIS — Z555 Less than a high school diploma: Secondary | ICD-10-CM

## 2023-09-08 DIAGNOSIS — L03313 Cellulitis of chest wall: Secondary | ICD-10-CM | POA: Diagnosis present

## 2023-09-08 DIAGNOSIS — I472 Ventricular tachycardia, unspecified: Secondary | ICD-10-CM | POA: Diagnosis present

## 2023-09-08 DIAGNOSIS — M109 Gout, unspecified: Secondary | ICD-10-CM | POA: Diagnosis present

## 2023-09-08 DIAGNOSIS — E0590 Thyrotoxicosis, unspecified without thyrotoxic crisis or storm: Secondary | ICD-10-CM | POA: Diagnosis present

## 2023-09-08 DIAGNOSIS — Z833 Family history of diabetes mellitus: Secondary | ICD-10-CM

## 2023-09-08 DIAGNOSIS — I2510 Atherosclerotic heart disease of native coronary artery without angina pectoris: Secondary | ICD-10-CM | POA: Diagnosis present

## 2023-09-08 DIAGNOSIS — Z951 Presence of aortocoronary bypass graft: Secondary | ICD-10-CM | POA: Diagnosis not present

## 2023-09-08 DIAGNOSIS — Y831 Surgical operation with implant of artificial internal device as the cause of abnormal reaction of the patient, or of later complication, without mention of misadventure at the time of the procedure: Secondary | ICD-10-CM | POA: Diagnosis present

## 2023-09-08 DIAGNOSIS — E1165 Type 2 diabetes mellitus with hyperglycemia: Secondary | ICD-10-CM | POA: Diagnosis present

## 2023-09-08 DIAGNOSIS — R509 Fever, unspecified: Secondary | ICD-10-CM | POA: Diagnosis not present

## 2023-09-08 DIAGNOSIS — Z1152 Encounter for screening for COVID-19: Secondary | ICD-10-CM | POA: Diagnosis not present

## 2023-09-08 DIAGNOSIS — Z8614 Personal history of Methicillin resistant Staphylococcus aureus infection: Secondary | ICD-10-CM

## 2023-09-08 DIAGNOSIS — T827XXD Infection and inflammatory reaction due to other cardiac and vascular devices, implants and grafts, subsequent encounter: Secondary | ICD-10-CM | POA: Diagnosis not present

## 2023-09-08 DIAGNOSIS — K567 Ileus, unspecified: Secondary | ICD-10-CM | POA: Diagnosis not present

## 2023-09-08 DIAGNOSIS — F17210 Nicotine dependence, cigarettes, uncomplicated: Secondary | ICD-10-CM | POA: Diagnosis present

## 2023-09-08 DIAGNOSIS — I493 Ventricular premature depolarization: Secondary | ICD-10-CM | POA: Diagnosis present

## 2023-09-08 DIAGNOSIS — R1084 Generalized abdominal pain: Secondary | ICD-10-CM | POA: Diagnosis not present

## 2023-09-08 DIAGNOSIS — I255 Ischemic cardiomyopathy: Secondary | ICD-10-CM | POA: Diagnosis present

## 2023-09-08 DIAGNOSIS — Z7951 Long term (current) use of inhaled steroids: Secondary | ICD-10-CM

## 2023-09-08 LAB — CBC WITH DIFFERENTIAL/PLATELET
Abs Immature Granulocytes: 0.1 10*3/uL — ABNORMAL HIGH (ref 0.00–0.07)
Basophils Absolute: 0.1 10*3/uL (ref 0.0–0.1)
Basophils Relative: 0 %
Eosinophils Absolute: 0 10*3/uL (ref 0.0–0.5)
Eosinophils Relative: 0 %
HCT: 34.8 % — ABNORMAL LOW (ref 39.0–52.0)
Hemoglobin: 11.6 g/dL — ABNORMAL LOW (ref 13.0–17.0)
Immature Granulocytes: 1 %
Lymphocytes Relative: 9 %
Lymphs Abs: 1.5 10*3/uL (ref 0.7–4.0)
MCH: 27 pg (ref 26.0–34.0)
MCHC: 33.3 g/dL (ref 30.0–36.0)
MCV: 80.9 fL (ref 80.0–100.0)
Monocytes Absolute: 1.7 10*3/uL — ABNORMAL HIGH (ref 0.1–1.0)
Monocytes Relative: 10 %
Neutro Abs: 14 10*3/uL — ABNORMAL HIGH (ref 1.7–7.7)
Neutrophils Relative %: 80 %
Platelets: 207 10*3/uL (ref 150–400)
RBC: 4.3 MIL/uL (ref 4.22–5.81)
RDW: 14.2 % (ref 11.5–15.5)
WBC: 17.4 10*3/uL — ABNORMAL HIGH (ref 4.0–10.5)
nRBC: 0 % (ref 0.0–0.2)

## 2023-09-08 LAB — CBC
HCT: 37.5 % — ABNORMAL LOW (ref 39.0–52.0)
Hemoglobin: 12.2 g/dL — ABNORMAL LOW (ref 13.0–17.0)
MCH: 26.2 pg (ref 26.0–34.0)
MCHC: 32.5 g/dL (ref 30.0–36.0)
MCV: 80.5 fL (ref 80.0–100.0)
Platelets: 230 10*3/uL (ref 150–400)
RBC: 4.66 MIL/uL (ref 4.22–5.81)
RDW: 14.3 % (ref 11.5–15.5)
WBC: 17.5 10*3/uL — ABNORMAL HIGH (ref 4.0–10.5)
nRBC: 0 % (ref 0.0–0.2)

## 2023-09-08 LAB — BASIC METABOLIC PANEL
Anion gap: 13 (ref 5–15)
BUN: 17 mg/dL (ref 8–23)
CO2: 23 mmol/L (ref 22–32)
Calcium: 9.1 mg/dL (ref 8.9–10.3)
Chloride: 95 mmol/L — ABNORMAL LOW (ref 98–111)
Creatinine, Ser: 1.23 mg/dL (ref 0.61–1.24)
GFR, Estimated: >60 mL/min (ref >60)
Glucose, Bld: 184 mg/dL — ABNORMAL HIGH (ref 70–99)
Potassium: 4.3 mmol/L (ref 3.5–5.1)
Sodium: 131 mmol/L — ABNORMAL LOW (ref 135–145)

## 2023-09-08 LAB — BRAIN NATRIURETIC PEPTIDE: B Natriuretic Peptide: 57.1 pg/mL (ref 0.0–100.0)

## 2023-09-08 LAB — SARS CORONAVIRUS 2 BY RT PCR: SARS Coronavirus 2 by RT PCR: NEGATIVE

## 2023-09-08 LAB — COMPREHENSIVE METABOLIC PANEL
ALT: 16 U/L (ref 0–44)
AST: 17 U/L (ref 15–41)
Albumin: 3.4 g/dL — ABNORMAL LOW (ref 3.5–5.0)
Alkaline Phosphatase: 110 U/L (ref 38–126)
Anion gap: 11 (ref 5–15)
BUN: 16 mg/dL (ref 8–23)
CO2: 23 mmol/L (ref 22–32)
Calcium: 8.9 mg/dL (ref 8.9–10.3)
Chloride: 98 mmol/L (ref 98–111)
Creatinine, Ser: 1.27 mg/dL — ABNORMAL HIGH (ref 0.61–1.24)
GFR, Estimated: >60 mL/min (ref >60)
Glucose, Bld: 184 mg/dL — ABNORMAL HIGH (ref 70–99)
Potassium: 4.1 mmol/L (ref 3.5–5.1)
Sodium: 132 mmol/L — ABNORMAL LOW (ref 135–145)
Total Bilirubin: 0.5 mg/dL (ref 0.3–1.2)
Total Protein: 7.7 g/dL (ref 6.5–8.1)

## 2023-09-08 LAB — PROTIME-INR
INR: 1.8 — ABNORMAL HIGH (ref 0.8–1.2)
Prothrombin Time: 20.7 s — ABNORMAL HIGH (ref 11.4–15.2)

## 2023-09-08 LAB — TROPONIN I (HIGH SENSITIVITY)
Troponin I (High Sensitivity): 6 ng/L (ref <18)
Troponin I (High Sensitivity): 5 ng/L (ref <18)

## 2023-09-08 MED ORDER — WARFARIN - PHARMACIST DOSING INPATIENT: Freq: Every day | Status: DC

## 2023-09-08 MED ORDER — GABAPENTIN 300 MG PO CAPS
300.0000 mg | ORAL_CAPSULE | Freq: Three times a day (TID) | ORAL | Status: DC
  Administered 2023-09-08 – 2023-09-10 (×5): 300 mg via ORAL
  Filled 2023-09-08 (×5): qty 1

## 2023-09-08 MED ORDER — MINOCYCLINE HCL 50 MG PO CAPS
200.0000 mg | ORAL_CAPSULE | Freq: Two times a day (BID) | ORAL | Status: DC
  Administered 2023-09-08 – 2023-09-10 (×4): 200 mg via ORAL
  Filled 2023-09-08 (×4): qty 4

## 2023-09-08 MED ORDER — MIRTAZAPINE 7.5 MG PO TABS
15.0000 mg | ORAL_TABLET | Freq: Every day | ORAL | Status: DC
  Administered 2023-09-08 – 2023-09-09 (×2): 15 mg via ORAL
  Filled 2023-09-08 (×2): qty 2

## 2023-09-08 MED ORDER — ROSUVASTATIN CALCIUM 20 MG PO TABS
20.0000 mg | ORAL_TABLET | Freq: Every day | ORAL | Status: DC
  Administered 2023-09-09 – 2023-09-10 (×2): 20 mg via ORAL
  Filled 2023-09-08 (×2): qty 1

## 2023-09-08 MED ORDER — CIPROFLOXACIN HCL 750 MG PO TABS
750.0000 mg | ORAL_TABLET | Freq: Two times a day (BID) | ORAL | Status: DC
  Administered 2023-09-08 – 2023-09-09 (×2): 750 mg via ORAL
  Filled 2023-09-08 (×2): qty 1

## 2023-09-08 MED ORDER — ONDANSETRON 4 MG PO TBDP
8.0000 mg | ORAL_TABLET | Freq: Once | ORAL | Status: AC
  Administered 2023-09-08: 8 mg via ORAL
  Filled 2023-09-08: qty 2

## 2023-09-08 MED ORDER — ONDANSETRON HCL 4 MG/2ML IJ SOLN: 4.0000 mg | Freq: Four times a day (QID) | INTRAMUSCULAR | Status: DC | PRN

## 2023-09-08 MED ORDER — PANTOPRAZOLE SODIUM 40 MG PO TBEC
40.0000 mg | DELAYED_RELEASE_TABLET | Freq: Every day | ORAL | Status: DC
  Administered 2023-09-08 – 2023-09-10 (×3): 40 mg via ORAL
  Filled 2023-09-08 (×3): qty 1

## 2023-09-08 MED ORDER — IOHEXOL 350 MG/ML SOLN
75.0000 mL | Freq: Once | INTRAVENOUS | Status: AC | PRN
  Administered 2023-09-08: 75 mL via INTRAVENOUS

## 2023-09-08 MED ORDER — SODIUM CHLORIDE 0.9 % IV SOLN
2.0000 g | Freq: Three times a day (TID) | INTRAVENOUS | Status: DC
  Administered 2023-09-08 – 2023-09-10 (×5): 2 g via INTRAVENOUS
  Filled 2023-09-08 (×5): qty 12.5

## 2023-09-08 MED ORDER — WARFARIN SODIUM 4 MG PO TABS
8.0000 mg | ORAL_TABLET | Freq: Once | ORAL | Status: AC
  Administered 2023-09-08: 8 mg via ORAL
  Filled 2023-09-08: qty 2

## 2023-09-08 MED ORDER — SERTRALINE HCL 25 MG PO TABS
50.0000 mg | ORAL_TABLET | Freq: Every day | ORAL | Status: DC
  Administered 2023-09-09 – 2023-09-10 (×2): 50 mg via ORAL
  Filled 2023-09-08 (×2): qty 2

## 2023-09-08 MED ORDER — IPRATROPIUM-ALBUTEROL 0.5-2.5 (3) MG/3ML IN SOLN
3.0000 mL | Freq: Four times a day (QID) | RESPIRATORY_TRACT | Status: DC
  Filled 2023-09-08: qty 3

## 2023-09-08 MED ORDER — TRAMADOL HCL 50 MG PO TABS
100.0000 mg | ORAL_TABLET | Freq: Two times a day (BID) | ORAL | Status: DC | PRN
  Administered 2023-09-08 – 2023-09-09 (×2): 100 mg via ORAL
  Filled 2023-09-08 (×2): qty 2

## 2023-09-08 MED ORDER — TAMSULOSIN HCL 0.4 MG PO CAPS
0.4000 mg | ORAL_CAPSULE | Freq: Every day | ORAL | Status: DC
  Administered 2023-09-09 – 2023-09-10 (×2): 0.4 mg via ORAL
  Filled 2023-09-08 (×2): qty 1

## 2023-09-08 MED ORDER — ALBUTEROL SULFATE (2.5 MG/3ML) 0.083% IN NEBU: 2.5000 mg | INHALATION_SOLUTION | Freq: Four times a day (QID) | RESPIRATORY_TRACT | Status: DC | PRN

## 2023-09-08 MED ORDER — SODIUM CHLORIDE 0.9 % IV SOLN
800.0000 mg | Freq: Every day | INTRAVENOUS | Status: DC
  Administered 2023-09-08 – 2023-09-09 (×2): 800 mg via INTRAVENOUS
  Filled 2023-09-08 (×2): qty 16

## 2023-09-08 MED ORDER — ACETAMINOPHEN 325 MG PO TABS
650.0000 mg | ORAL_TABLET | ORAL | Status: DC | PRN
  Administered 2023-09-08 – 2023-09-09 (×3): 650 mg via ORAL
  Filled 2023-09-08 (×3): qty 2

## 2023-09-08 MED ORDER — IPRATROPIUM-ALBUTEROL 0.5-2.5 (3) MG/3ML IN SOLN: 3.0000 mL | Freq: Four times a day (QID) | RESPIRATORY_TRACT | Status: DC

## 2023-09-08 NOTE — Progress Notes (Addendum)
Pharmacy Antibiotic Note  Kevin Underwood. is a 62 y.o. male for which pharmacy has been consulted for cefepime and daptomycin dosing for  driveline infection . Patient on cefepime and daptomycin on recent admission for DL infx w/ MRSA and Pseudomonas in wound cx (BCx neg). Courses completed 9/5 and started on ciprofloxacin and minocycline outpatient. Hx acinetobacter DLI as well. S/p debridement 7/31& 8/8.  SCr 1.23 WBC 17.5; T 98.2; HR 107; RR 25  Plan: Daptomycin 800mg  q24h Cefepime 2g q8hr  Monitor WBC, fever, renal function, cultures De-escalate when able F/u ID recommendations  Height: 5\' 10"  (177.8 cm) Weight: 96.1 kg (211 lb 13.8 oz) IBW/kg (Calculated) : 73  Temp (24hrs), Avg:98.2 F (36.8 C), Min:98.2 F (36.8 C), Max:98.2 F (36.8 C)  No results for input(s): "WBC", "CREATININE", "LATICACIDVEN", "VANCOTROUGH", "VANCOPEAK", "VANCORANDOM", "GENTTROUGH", "GENTPEAK", "GENTRANDOM", "TOBRATROUGH", "TOBRAPEAK", "TOBRARND", "AMIKACINPEAK", "AMIKACINTROU", "AMIKACIN" in the last 168 hours.  Estimated Creatinine Clearance: 74.8 mL/min (by C-G formula based on SCr of 1.19 mg/dL).    Allergies  Allergen Reactions   Vancomycin Nausea And Vomiting and Other (See Comments)    Severe joint pain   Microbiology results: 7/29 BCx: neg 7/29 Driveline Wound Cx: pseudomonas/MRSA  7/31 Driveline abscess cx: pseudomonas (pan-sensitive)/MRSA  Thank you for allowing pharmacy to be a part of this patient's care.  Delmar Landau, PharmD, BCPS 09/08/2023 7:22 PM ED Clinical Pharmacist -  [phone]

## 2023-09-08 NOTE — ED Provider Triage Note (Signed)
Emergency Medicine Provider Triage Evaluation Note  Nazar Kuan. , a 62 y.o. male  was evaluated in triage.  Pt complains of shortness of breath.  Patient states that he woke up this morning with shortness of breath as well as feelings of dizziness.  Dizziness described as feeling as if he is going to pass out.  Patient LVAD patient.  No fevers, chills..  Review of Systems  Positive: See above Negative:   Physical Exam  BP 107/64   Pulse (!) 111   Temp 98.2 F (36.8 C)   Resp 18   Ht 5\' 10"  (1.778 m)   Wt 96.1 kg   SpO2 100%   BMI 30.40 kg/m  Gen:   Awake, no distress   Resp:  Normal effort  MSK:   Moves extremities without difficulty  Other:  Wheeze.  No lower extremity edema.  Medical Decision Making  Medically screening exam initiated at 5:21 PM.  Appropriate orders placed.  [name] was informed that the remainder of the evaluation will be completed by another provider, this initial triage assessment does not replace that evaluation, and the importance of remaining in the ED until their evaluation is complete.  LVAD team at bedside   Peter Garter, [place] 09/08/23 1722

## 2023-09-08 NOTE — ED Notes (Signed)
ED TO INPATIENT HANDOFF REPORT  ED Nurse Name and Phone #: [phone]   S Name/Age/Gender Kevin Underwood. 62 y.o. male Room/Bed: 030C/030C  Code Status   Code Status: Partial Code  Home/SNF/Other Home Patient oriented to: self, place, time, and situation Is this baseline? Yes   Triage Complete: Triage complete  Chief Complaint Sepsis [facility]) [A41.9]  Triage Note Pt c/o dizziness and SOB started at 1100 today.    Allergies Allergies  Allergen Reactions   Vancomycin Nausea And Vomiting and Other (See Comments)    Severe joint pain    Level of Care/Admitting Diagnosis ED Disposition     ED Disposition  Admit   Condition  --   Comment  Hospital Area: [facility] [[id number]]  Level of Care: Progressive [102]  Admit to Progressive based on following criteria: CARDIOVASCULAR & THORACIC of moderate stability with acute coronary syndrome symptoms/low risk myocardial infarction/hypertensive urgency/arrhythmias/heart failure potentially compromising stability and stable post cardiovascular intervention patients.  May admit patient to [facility] or [facility] if equivalent level of care is available:: No  Covid Evaluation: Asymptomatic - no recent exposure (last 10 days) testing not required  Diagnosis: Sepsis [facility]) [[id number]]  Admitting Physician: Dorthula Nettles [[id number]]  Attending Physician: Dorthula Nettles [phone]  Bed request comments: 2C  Certification:: I certify this patient will need inpatient services for at least 2 midnights  Expected Medical Readiness: 09/11/2023          B Medical/Surgery History Past Medical History:  Diagnosis Date   "    Arthritis    CAD (coronary artery disease)    a. s/p CABG in 11/2019 with LIMA-LAD, SVG-OM1, SVG-PDA and SVG-D1   CHF (congestive heart failure) (HCC)    a. EF < 20% by echo in 11/2019   Essential hypertension    PAF (paroxysmal atrial fibrillation) (HCC)    Type 2 diabetes mellitus (HCC)     Past Surgical History:  Procedure Laterality Date   APPLICATION OF WOUND VAC Left 06/14/2022   Procedure: APPLICATION OF WOUND VAC;  Surgeon: Lovett Sox, MD;  [facility];  Service: Thoracic;  Laterality: Left;   APPLICATION OF WOUND VAC N/A 06/20/2022   Procedure: VAD POWERCORD TUNNEL WOUND VAC CHANGE;  Surgeon: Lovett Sox, MD;  [facility];  Service: Thoracic;  Laterality: N/A;   APPLICATION OF WOUND VAC N/A 06/27/2022   Procedure: WOUND VAC CHANGE;  Surgeon: Lovett Sox, MD;  [facility];  Service: Thoracic;  Laterality: N/A;   APPLICATION OF WOUND VAC N/A 07/08/2022   Procedure: WOUND VAC CHANGE;  Surgeon: Lovett Sox, MD;  [facility];  Service: Thoracic;  Laterality: N/A;   APPLICATION OF WOUND VAC N/A 01/28/2023   Procedure: APPLICATION OF WOUND VAC;  Surgeon: Lovett Sox, MD;  [facility];  Service: Thoracic;  Laterality: N/A;   APPLICATION OF WOUND VAC N/A 02/05/2023   Procedure: WOUND VAC CHANGE;  Surgeon: Lovett Sox, MD;  [facility];  Service: Thoracic;  Laterality: N/A;   APPLICATION OF WOUND VAC N/A 02/12/2023   Procedure: REMOVAL OF WOUND VAC DRESSING;  Surgeon: Lovett Sox, MD;  [facility];  Service: Thoracic;  Laterality: N/A;   APPLICATION OF WOUND VAC N/A 07/09/2023   Procedure: APPLICATION OF WOUND VAC;  Surgeon: Lovett Sox, MD;  [facility];  Service: Thoracic;  Laterality: N/A;   BACK SURGERY     CLIPPING OF ATRIAL APPENDAGE N/A 11/26/2019   Procedure: Clipping Of Atrial Appendage  using AtriCure 40 Clip;  Surgeon: [name], MD;  [facility];  Service: Open Heart Surgery;  Laterality: N/A;   CORONARY ARTERY BYPASS GRAFT N/A 11/26/2019   Procedure: CORONARY ARTERY BYPASS GRAFTING (CABG) using endoscopic greater saphenous vein harvest: svc to OM; svc to Diag; svc to PD; and LIMA to LAD.;  Surgeon: [name], MD;  [facility];  Service: Open Heart Surgery;  Laterality: N/A;   FOOT SURGERY      HAND SURGERY     INSERTION OF IMPLANTABLE LEFT VENTRICULAR ASSIST DEVICE N/A 11/17/2020   Procedure: INSERTION OF IMPLANTABLE LEFT VENTRICULAR ASSIST DEVICE - HM3;  Surgeon: [name], MD;  [facility];  Service: Open Heart Surgery;  Laterality: N/A;   INTRAOPERATIVE TRANSESOPHAGEAL ECHOCARDIOGRAM  12/04/2019   Procedure: Intraoperative Transesophageal Echocardiogram;  Surgeon: [name], MD;  [facility];  Service: Open Heart Surgery;;   MAZE N/A 11/26/2019   Procedure: MAZE using Bilateral Pulmonary Vein isolation.;  Surgeon: [name], MD;  [facility];  Service: Open Heart Surgery;  Laterality: N/A;   MULTIPLE EXTRACTIONS WITH ALVEOLOPLASTY N/A 11/16/2020   Procedure: MULTIPLE EXTRACTION WITH ALVEOLOPLASTY;  Surgeon: Sharman Cheek, DMD;  [facility];  Service: Dentistry;  Laterality: N/A;   PLACEMENT OF IMPELLA LEFT VENTRICULAR ASSIST DEVICE N/A 11/24/2019   Procedure: PLACEMENT OF IMPELLA 5.5 LEFT VENTRICULAR ASSIST DEVICE;  Surgeon: [name], MD;  [facility];  Service: Open Heart Surgery;  Laterality: N/A;   PLACEMENT OF IMPELLA LEFT VENTRICULAR ASSIST DEVICE Left 11/10/2020   Procedure: PLACEMENT OF IMPELLA 5.5 LEFT VENTRICULAR ASSIST DEVICE VIA  LEFT AVILLARY ARTERY;  Surgeon: [name], MD;  [facility];  Service: Open Heart Surgery;  Laterality: Left;   REMOVAL OF IMPELLA LEFT VENTRICULAR ASSIST DEVICE Right 12/04/2019   Procedure: REMOVAL OF IMPELLA LEFT VENTRICULAR ASSIST DEVICE, right axilla;  Surgeon: [name], MD;  Location: [facility] OR;  Service: Open Heart Surgery;  Laterality: Right;   RIGHT/LEFT HEART CATH AND CORONARY ANGIOGRAPHY N/A 11/24/2019   Procedure: RIGHT/LEFT HEART CATH AND CORONARY ANGIOGRAPHY;  Surgeon: Laurey Morale, MD;  Location: [facility] INVASIVE CV LAB;  Service: Cardiovascular;  Laterality: N/A;   RIGHT/LEFT HEART CATH AND CORONARY/GRAFT ANGIOGRAPHY N/A 11/09/2020   Procedure: RIGHT/LEFT  HEART CATH AND CORONARY/GRAFT ANGIOGRAPHY;  Surgeon: [name], MD;  [facility];  Service: Cardiovascular;  Laterality: N/A;   STERNAL WOUND DEBRIDEMENT N/A 05/16/2022   Procedure: VAD DRIVELINE DEBRIDEMENT;  Surgeon: Lovett Sox, MD;  [facility];  Service: Thoracic;  Laterality: N/A;  VANC PULSE LAVAGE   STERNAL WOUND DEBRIDEMENT Left 06/14/2022   Procedure: DEBRIDEMENT OF VAD POWERCORD TUNNEL;  Surgeon: Lovett Sox, MD;  [facility];  Service: Thoracic;  Laterality: Left;   STERNAL WOUND DEBRIDEMENT N/A 06/27/2022   Procedure: ABDOMINAL WOUND DEBRIDEMENT, APPLICATION OF MYRIAD;  Surgeon: Lovett Sox, MD;  [facility];  Service: Thoracic;  Laterality: N/A;   STERNAL WOUND DEBRIDEMENT N/A 07/08/2022   Procedure: ABDOMINAL WOUND DEBRIDEMENT;  Surgeon: Lovett Sox, MD;  [facility];  Service: Thoracic;  Laterality: N/A;   STERNAL WOUND DEBRIDEMENT N/A 01/28/2023   Procedure: ABDOMINAL DRIVELINE WOUND DEBRIDEMENT;  Surgeon: Lovett Sox, MD;  [facility];  Service: Thoracic;  Laterality: N/A;   STERNAL WOUND DEBRIDEMENT N/A 02/05/2023   Procedure: ABDOMINAL DRIVELINE WOUND DEBRIDEMENT;  Surgeon: Lovett Sox, MD;  [facility];  Service: Thoracic;  Laterality: N/A;  STERNAL WOUND DEBRIDEMENT N/A 02/12/2023   Procedure: ABDOMINAL DRIVELINE WOUND DEBRIDEMENT AND APPLICATION OF MATRIX WOUND DRESSING;  Surgeon: Lovett Sox, MD;  [facility];  Service: Thoracic;  Laterality: N/A;   STERNAL WOUND DEBRIDEMENT N/A 07/09/2023   Procedure: DRIVELINE WOUND DEBRIDEMENT;  Surgeon: Lovett Sox, MD;  [facility];  Service: Thoracic;  Laterality: N/A;   STERNAL WOUND DEBRIDEMENT N/A 07/17/2023   Procedure: DRIVELINE WOUND DEBRIDEMENT;  Surgeon: Lovett Sox, MD;  [facility];  Service: Thoracic;  Laterality: N/A;   TEE WITHOUT CARDIOVERSION N/A 11/24/2019   Procedure: TRANSESOPHAGEAL ECHOCARDIOGRAM (TEE);  Surgeon: [name],  MD;  Location: [facility] OR;  Service: Open Heart Surgery;  Laterality: N/A;   TEE WITHOUT CARDIOVERSION N/A 11/26/2019   Procedure: TRANSESOPHAGEAL ECHOCARDIOGRAM (TEE);  Surgeon: [name], MD;  Location: [facility] OR;  Service: Open Heart Surgery;  Laterality: N/A;   TEE WITHOUT CARDIOVERSION N/A 11/10/2020   Procedure: TRANSESOPHAGEAL ECHOCARDIOGRAM (TEE);  Surgeon: [name], MD;  Location: [facility] OR;  Service: Open Heart Surgery;  Laterality: N/A;   TEE WITHOUT CARDIOVERSION N/A 11/17/2020   Procedure: TRANSESOPHAGEAL ECHOCARDIOGRAM (TEE);  Surgeon: [name], MD;  Location: [facility] OR;  Service: Open Heart Surgery;  Laterality: N/A;   WOUND EXPLORATION N/A 05/31/2022   Procedure: WOUND EXPLORATION, Debridement and Irrigation of abdominal wound;  Surgeon: Lovett Sox, MD;  [facility];  Service: Thoracic;  Laterality: N/A;     A IV Location/Drains/Wounds Patient Lines/Drains/Airways Status     Active Line/Drains/Airways     Name Placement date Placement time Site Days   Peripheral IV 09/08/23 20 G 1" Right Antecubital 09/08/23  1915  Antecubital  less than 1            Intake/Output Last 24 hours No intake or output data in the 24 hours ending 09/08/23 2001  Labs/Imaging Results for orders placed or performed during the hospital encounter of 09/08/23 (from the past 48 hour(s))  SARS Coronavirus 2 by RT PCR (hospital order, performed in [facility] hospital lab) *cepheid single result test* Anterior Nasal Swab     Status: None   Collection Time: 09/08/23  5:22 PM   Specimen: Anterior Nasal Swab  Result Value Ref Range   SARS Coronavirus 2 by RT PCR NEGATIVE NEGATIVE    Comment: Performed at [facility], [address] [place], [place] [address]   No results found.  Pending Labs Unresulted Labs (From admission, onward)     Start     Ordered   09/08/23 1851  Culture, blood (routine x 2)  BLOOD CULTURE X 2,   R      09/08/23 1850   09/08/23 1755  Rapid  urine drug screen (hospital performed)  ONCE - STAT,   STAT        09/08/23 1754   09/08/23 1722  Brain natriuretic peptide  Once,   URGENT        09/08/23 1721   09/08/23 1713  Basic metabolic panel  Once,   STAT        09/08/23 1713   09/08/23 1713  CBC  Once,   STAT        09/08/23 1713   Signed and Held  Protime-INR  ONCE - STAT,   R       Comments: Blood test to be collected in minimum volume tubes    Signed and Held   Signed and Held  Lactate dehydrogenase  ONCE - STAT,  R       Comments: Blood test to be collected in minimum volume tubes    Signed and Held   Signed and Held  Lactate dehydrogenase  Daily,   R      Signed and Held   Signed and Held  Protime-INR  Daily,   R      Signed and Held   Signed and Held  CBC  Daily,   R      Signed and Held   Signed and Held  Basic metabolic panel  Daily,   R      Signed and Held   Signed and Held  Comprehensive metabolic panel  ONCE - STAT,   R       Comments: Blood test to be collected in minimum volume tubes    Signed and Held   Signed and Held  CBC with Differential/Platelet  ONCE - STAT,   R       Comments: Blood test to be collected in minimum tubes    Signed and Held            Vitals/Pain Today's Vitals   09/08/23 1710 09/08/23 1900 09/08/23 1915 09/08/23 1930  BP:  (!) 106/91  113/86  Pulse:  (!) 105  100  Resp:  (!) 23  (!) 21  Temp:      SpO2:  94%  97%  Weight: 96.1 kg     Height: 5\' 10"  (1.778 m)     PainSc:   9      Isolation Precautions No active isolations  Medications Medications  DAPTOmycin (CUBICIN) 800 mg in sodium chloride 0.9 % IVPB (has no administration in time range)  ceFEPIme (MAXIPIME) 2 g in sodium chloride 0.9 % 100 mL IVPB (2 g Intravenous New Bag/Given 09/08/23 1938)  ondansetron (ZOFRAN-ODT) disintegrating tablet 8 mg (8 mg Oral Given 09/08/23 1808)    Mobility walks     Focused Assessments Cardiac Assessment Handoff:    Lab Results  Component Value Date   CKTOTAL 61  08/12/2023   Lab Results  Component Value Date   DDIMER 2.01 (H) 11/17/2020   Does the Patient currently have chest pain? No    R Recommendations: See Admitting Provider Note  Report given to:   Additional Notes: patient has dizziness with unsteady gait, standby assist required at this time, he is A&O X4, wife [name] present at bedside

## 2023-09-08 NOTE — H&P (Signed)
[name]    Advanced Heart Failure VAD History and Physical Note   PCP-Cardiologist: Nona Dell, MD   Reason for Admission: driveline infection and suspected driveline fracture  HPI:    Kevin Underwood. Is a 62 y.o. 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 type 2 diabetes mellitus, COPD, and hypertension. Now s/p Heartmate 3 LVAD.   Patient had echo in 10/21 with EF 20-25%.  He was admitted in 12/21 with cardiogenic shock.  Cath showed patent grafts and low cardiac output.  Suspect mixed ischemic/nonischemic cardiomyopathy, prior heavy ETOH though no ETOH or smoking since 12/20 CABG.  He required stabilization with Impella 5.5 and ultimately had placement of Heartmate 3 LVAD.  Paroxysmal atrial fibrillation noted post-op, but generally stable course and discharged home.     Patient was admitted in 10/22 with COPD exacerbation.  Kevin Underwood was stopped with frequent PIs.  He was treated with steroids, nebs, antibiotics with improvement.    Ramp echo was done in 2/23.  At 5600 rpm the LV was small and the septum with some left shift, aortic valve opening 3/5 beats.  At 5500 rpm, the LV was larger and septum more midline, aortic valve still opening 3/5 beats.  The RV was moderately enlarged with normal systolic function. I left speed at 5500 rpm.    Patient had a prolonged admission in 6/23 and again in 7/23-8/23 with MRSA driveline infection.  He went to the OR multiple times for debridement and wound vac changes.  He completed vancomycin and was placed on doxycycline.    Levofloxacin added d/t increased drainage and redness after last f/u on 09/04/22. Wound culture grew acinetobacter.  Wound initially improved but cultures repeated d/t decreased healing over last few dressing changes. Levofloxacin recently stopped and back on  doxy.  Grew acinetobacter and MRSA on culture. ID recommended switching to minocycline 200 BID based on culture results.   Patient was admitted with swollen and erythematous right wrist in 10/23.  There was concern for septic arthritis. CT showed no effusion that could be tapped.  Suspect this was likely a gout flare as it improved rapidly with colchicine.     Patient was admitted with recurrent MRSA driveline site infection in 2/24.  He went to the OR several times for debridement and wound vac management. Also with mild AECOPD. He was on daptomycin + minocycline, completed daptomycin at SNF.   Kevin Underwood was once again admitted on 7/29 with recurrent driveline infection and multiple falls at home.  He was ultimately treated with 4 weeks of IV antibiotics with daptomycin, cefepime and minocycline.  He was taken for debridement twice during this admission and discharged on p.o. minocycline and ciprofloxacin for 6-week course with estimated completion on 08/28/2023.  He was seen in LVAD clinic on 08/26/2023 where he reportedly completed his antibiotic course and was compliant with minocycline 200 mg twice daily and ciprofloxacin 750 mg twice daily.  Denied missing any doses although he no showed his infectious disease appointment.  Today he presents to the emergency department with 1 week complaint of increasing fatigue and dyspnea.  He reports this morning at 10 AM he started feeling extremely weak, short of breath with some chills/fatigue and nausea.  He is unable to walk more than 5 to 10 feet and feels that he is once more getting septic.  Workup in the ER pending at this time.  Home Medications Prior to Admission medications   Medication Sig Start Date End Date Taking? Authorizing Provider  acetaminophen (TYLENOL) 325 MG tablet Take 2 tablets (650 mg total) by mouth every 4 (four) hours as needed for headache or mild pain. 02/20/23   Alen Bleacher, NP  albuterol (PROVENTIL) (2.5 MG/3ML) 0.083%  nebulizer solution INHALE 3 ML BY NEBULIZATION EVERY 6 HOURS AS NEEDED FOR WHEEZING OR SHORTNESS OF BREATH 09/10/22   Laurey Morale, MD  albuterol (VENTOLIN HFA) 108 (90 Base) MCG/ACT inhaler Inhale 2 puffs into the lungs every 6 (six) hours as needed for wheezing or shortness of breath.    [provider]  allopurinol (ZYLOPRIM) 100 MG tablet Take 1 tablet (100 mg total) by mouth daily. 12/04/21   Laurey Morale, MD  clonazePAM (KLONOPIN) 0.25 MG disintegrating tablet Take 1 tablet (0.25 mg total) by mouth 2 (two) times daily. Patient not taking: Reported on 04/21/2023 03/24/23   Alen Bleacher, NP  colchicine 0.6 MG tablet Take 1 tablet (0.6 mg total) by mouth daily as needed (gout pain). 04/14/23   Laurey Morale, MD  COMBIVENT RESPIMAT 20-100 MCG/ACT AERS respimat Inhale 1 puff into the lungs every 6 (six) hours. 09/10/22   [provider]  feeding supplement, GLUCERNA SHAKE, (GLUCERNA SHAKE) LIQD Take 237 mLs by mouth 3 (three) times daily with meals. 02/20/23   Alen Bleacher, NP  fluticasone [facility] ALLERGY RELIEF) 50 MCG/ACT nasal spray Place 2 sprays into both nostrils as needed for allergies or rhinitis.    [provider]  glipiZIDE (GLUCOTROL) 5 MG tablet Take 1 tablet (5 mg total) by mouth daily. 04/14/23 04/13/24  Laurey Morale, MD  loratadine (CLARITIN) 10 MG tablet Take 1 tablet (10 mg total) by mouth daily. 04/14/23   Laurey Morale, MD  metFORMIN (GLUCOPHAGE) 1000 MG tablet Take 1 tablet (1,000 mg total) by mouth 2 (two) times daily. 04/14/23   Laurey Morale, MD  methimazole (TAPAZOLE) 5 MG tablet TAKE 1 TABLET BY MOUTH EVERY DAY WITH BREAKFAST Strength: 5 mg 04/14/23   Laurey Morale, MD  metoprolol succinate (TOPROL XL) 25 MG 24 hr tablet Take 1 tablet (25 mg total) by mouth daily. 04/14/23   Laurey Morale, MD  minocycline (MINOCIN) 100 MG capsule Take 2 capsules (200 mg total) by mouth 2 (two) times daily. 04/14/23   Laurey Morale, MD  mirtazapine  (REMERON) 15 MG tablet Take 1 tablet (15 mg total) by mouth at bedtime. 04/14/23   Laurey Morale, MD  Multiple Vitamin (MULTIVITAMIN WITH MINERALS) TABS tablet Take 1 tablet by mouth daily. 02/20/23   Alen Bleacher, NP  Multiple Vitamin (MULTIVITAMIN) tablet Take 1 tablet by mouth daily.    [provider]  pantoprazole (PROTONIX) 40 MG tablet Take 1 tablet (40 mg total) by mouth daily. 04/14/23   Laurey Morale, MD  rosuvastatin (CRESTOR) 20 MG tablet Take 1 tablet (20 mg total) by mouth daily. 04/14/23   Laurey Morale, MD  sertraline (ZOLOFT) 50 MG tablet Take 1 tablet (50 mg total) by mouth daily. 04/14/23   Laurey Morale, MD  tamsulosin (FLOMAX) 0.4 MG CAPS capsule Take 1 capsule (0.4 mg total) by mouth daily. 04/14/23   Laurey Morale, MD  thiamine (VITAMIN B-1) 100 MG tablet Take 1 tablet (100 mg total) by mouth daily. 02/20/23   Alen Bleacher, NP  traZODone (DESYREL)  150 MG tablet Take 1 tablet (150 mg total) by mouth at bedtime. 03/11/22   Laurey Morale, MD  TRELEGY ELLIPTA 100-62.5-25 MCG/ACT AEPB Inhale 1 puff into the lungs daily. 04/14/23   Laurey Morale, MD  triamcinolone ointment (KENALOG) 0.5 % Apply 1 Application topically daily. Apply to affected area during wound care daily 03/03/23   Lovett Sox, MD  warfarin (COUMADIN) 4 MG tablet Take 1-1.5 tablets (4-6 mg total) by mouth See admin instructions. 4 mg every Tuesday and Thursday. 6 mg all other days 04/14/23   Laurey Morale, MD  zinc sulfate 220 (50 Zn) MG capsule Take 1 capsule (220 mg total) by mouth daily. 02/20/23   Alen Bleacher, NP    Past Medical History: Past Medical History:  Diagnosis Date   "    Arthritis    CAD (coronary artery disease)    a. s/p CABG in 11/2019 with LIMA-LAD, SVG-OM1, SVG-PDA and SVG-D1   CHF (congestive heart failure) (HCC)    a. EF < 20% by echo in 11/2019   Essential hypertension    PAF (paroxysmal atrial fibrillation) (HCC)    Type 2 diabetes mellitus (HCC)     Past  Surgical History: Past Surgical History:  Procedure Laterality Date   APPLICATION OF WOUND VAC Left 06/14/2022   Procedure: APPLICATION OF WOUND VAC;  Surgeon: Lovett Sox, MD;  [facility]: [facility];  Service: Thoracic;  Laterality: Left;   APPLICATION OF WOUND VAC N/A 06/20/2022   Procedure: VAD POWERCORD TUNNEL WOUND VAC CHANGE;  Surgeon: Lovett Sox, MD;  Location: [facility];  Service: Thoracic;  Laterality: N/A;   APPLICATION OF WOUND VAC N/A 06/27/2022   Procedure: WOUND VAC CHANGE;  Surgeon: Lovett Sox, MD;  Location: [facility];  Service: Thoracic;  Laterality: N/A;   APPLICATION OF WOUND VAC N/A 07/08/2022   Procedure: WOUND VAC CHANGE;  Surgeon: Lovett Sox, MD;  [facility]: [facility];  Service: Thoracic;  Laterality: N/A;   APPLICATION OF WOUND VAC N/A 01/28/2023   Procedure: APPLICATION OF WOUND VAC;  Surgeon: Lovett Sox, MD;  [facility]: [facility];  Service: Thoracic;  Laterality: N/A;   APPLICATION OF WOUND VAC N/A 02/05/2023   Procedure: WOUND VAC CHANGE;  Surgeon: Lovett Sox, MD;  [facility]: [facility];  Service: Thoracic;  Laterality: N/A;   APPLICATION OF WOUND VAC N/A 02/12/2023   Procedure: REMOVAL OF WOUND VAC DRESSING;  Surgeon: Lovett Sox, MD;  [facility]: [facility];  Service: Thoracic;  Laterality: N/A;   APPLICATION OF WOUND VAC N/A 07/09/2023   Procedure: APPLICATION OF WOUND VAC;  Surgeon: Lovett Sox, MD;  [facility]: [facility];  Service: Thoracic;  Laterality: N/A;   BACK SURGERY     CLIPPING OF ATRIAL APPENDAGE N/A 11/26/2019   Procedure: Clipping Of Atrial Appendage using AtriCure 40 Clip;  Surgeon: [name], MD;  Location: [facility];  Service: Open Heart Surgery;  Laterality: N/A;   CORONARY ARTERY BYPASS GRAFT N/A 11/26/2019   Procedure: CORONARY ARTERY BYPASS GRAFTING (CABG) using endoscopic greater saphenous vein harvest: svc to OM; svc to Diag; svc to PD; and LIMA to LAD.;  Surgeon: [name], MD;  Location: [facility];  Service: Open Heart Surgery;  Laterality: N/A;    FOOT SURGERY     HAND SURGERY     INSERTION OF IMPLANTABLE LEFT VENTRICULAR ASSIST DEVICE N/A 11/17/2020   Procedure: INSERTION OF IMPLANTABLE LEFT VENTRICULAR ASSIST DEVICE - HM3;  Surgeon: [name], MD;  [facility]: [facility];  Service: Open Heart Surgery;  Laterality: N/A;   INTRAOPERATIVE TRANSESOPHAGEAL ECHOCARDIOGRAM  12/04/2019   Procedure: Intraoperative Transesophageal Echocardiogram;  Surgeon: [name], MD;  [facility]: [facility];  Service: Open Heart Surgery;;   MAZE N/A 11/26/2019   Procedure: MAZE using Bilateral Pulmonary Vein isolation.;  Surgeon: [name], MD;  [facility];  Service: Open Heart Surgery;  Laterality: N/A;   MULTIPLE EXTRACTIONS WITH ALVEOLOPLASTY N/A 11/16/2020   Procedure: MULTIPLE EXTRACTION WITH ALVEOLOPLASTY;  Surgeon: Sharman Cheek, DMD;  [facility];  Service: Dentistry;  Laterality: N/A;   PLACEMENT OF IMPELLA LEFT VENTRICULAR ASSIST DEVICE N/A 11/24/2019   Procedure: PLACEMENT OF IMPELLA 5.5 LEFT VENTRICULAR ASSIST DEVICE;  Surgeon: [name], MD;  [facility]: [facility];  Service: Open Heart Surgery;  Laterality: N/A;   PLACEMENT OF IMPELLA LEFT VENTRICULAR ASSIST DEVICE Left 11/10/2020   Procedure: PLACEMENT OF IMPELLA 5.5 LEFT VENTRICULAR ASSIST DEVICE VIA  LEFT AVILLARY ARTERY;  Surgeon: [name], MD;  [facility]: [facility];  Service: Open Heart Surgery;  Laterality: Left;   REMOVAL OF IMPELLA LEFT VENTRICULAR ASSIST DEVICE Right 12/04/2019   Procedure: REMOVAL OF IMPELLA LEFT VENTRICULAR ASSIST DEVICE, right axilla;  Surgeon: [name], MD;  Location: [facility] OR;  Service: Open Heart Surgery;  Laterality: Right;   RIGHT/LEFT HEART CATH AND CORONARY ANGIOGRAPHY N/A 11/24/2019   Procedure: RIGHT/LEFT HEART CATH AND CORONARY ANGIOGRAPHY;  Surgeon: Laurey Morale, MD;  Location: [facility] INVASIVE CV LAB;  Service: Cardiovascular;  Laterality: N/A;   RIGHT/LEFT HEART CATH AND CORONARY/GRAFT ANGIOGRAPHY N/A 11/09/2020    Procedure: RIGHT/LEFT HEART CATH AND CORONARY/GRAFT ANGIOGRAPHY;  Surgeon: [name], MD;  Location: [facility] INVASIVE CV LAB;  Service: Cardiovascular;  Laterality: N/A;   STERNAL WOUND DEBRIDEMENT N/A 05/16/2022   Procedure: VAD DRIVELINE DEBRIDEMENT;  Surgeon: Lovett Sox, MD;  [facility]: [facility];  Service: Thoracic;  Laterality: N/A;  VANC PULSE LAVAGE   STERNAL WOUND DEBRIDEMENT Left 06/14/2022   Procedure: DEBRIDEMENT OF VAD POWERCORD TUNNEL;  Surgeon: Lovett Sox, MD;  [facility];  Service: Thoracic;  Laterality: Left;   STERNAL WOUND DEBRIDEMENT N/A 06/27/2022   Procedure: ABDOMINAL WOUND DEBRIDEMENT, APPLICATION OF MYRIAD;  Surgeon: Lovett Sox, MD;  [facility];  Service: Thoracic;  Laterality: N/A;   STERNAL WOUND DEBRIDEMENT N/A 07/08/2022   Procedure: ABDOMINAL WOUND DEBRIDEMENT;  Surgeon: Lovett Sox, MD;  [facility];  Service: Thoracic;  Laterality: N/A;   STERNAL WOUND DEBRIDEMENT N/A 01/28/2023   Procedure: ABDOMINAL DRIVELINE WOUND DEBRIDEMENT;  Surgeon: Lovett Sox, MD;  [facility];  Service: Thoracic;  Laterality: N/A;   STERNAL WOUND DEBRIDEMENT N/A 02/05/2023   Procedure: ABDOMINAL DRIVELINE WOUND DEBRIDEMENT;  Surgeon: Lovett Sox, MD;  [facility];  Service: Thoracic;  Laterality: N/A;   STERNAL WOUND DEBRIDEMENT N/A 02/12/2023   Procedure: ABDOMINAL DRIVELINE WOUND DEBRIDEMENT AND APPLICATION OF MATRIX WOUND DRESSING;  Surgeon: Lovett Sox, MD;  [facility];  Service: Thoracic;  Laterality: N/A;   STERNAL WOUND DEBRIDEMENT N/A 07/09/2023   Procedure: DRIVELINE WOUND DEBRIDEMENT;  Surgeon: Lovett Sox, MD;  [facility];  Service: Thoracic;  Laterality: N/A;   STERNAL WOUND DEBRIDEMENT N/A 07/17/2023   Procedure: DRIVELINE WOUND DEBRIDEMENT;  Surgeon: Lovett Sox, MD;  [facility];  Service: Thoracic;  Laterality: N/A;   TEE WITHOUT CARDIOVERSION N/A 11/24/2019   Procedure: TRANSESOPHAGEAL ECHOCARDIOGRAM (TEE);   Surgeon: [name], MD;  Location: [facility] OR;  Service: Open Heart Surgery;  Laterality: N/A;  TEE WITHOUT CARDIOVERSION N/A 11/26/2019   Procedure: TRANSESOPHAGEAL ECHOCARDIOGRAM (TEE);  Surgeon: [name], MD;  Location: [facility] OR;  Service: Open Heart Surgery;  Laterality: N/A;   TEE WITHOUT CARDIOVERSION N/A 11/10/2020   Procedure: TRANSESOPHAGEAL ECHOCARDIOGRAM (TEE);  Surgeon: [name], MD;  Location: [facility] OR;  Service: Open Heart Surgery;  Laterality: N/A;   TEE WITHOUT CARDIOVERSION N/A 11/17/2020   Procedure: TRANSESOPHAGEAL ECHOCARDIOGRAM (TEE);  Surgeon: [name], MD;  Location: [facility] OR;  Service: Open Heart Surgery;  Laterality: N/A;   WOUND EXPLORATION N/A 05/31/2022   Procedure: WOUND EXPLORATION, Debridement and Irrigation of abdominal wound;  Surgeon: Lovett Sox, MD;  [facility];  Service: Thoracic;  Laterality: N/A;    Family History: Family History  Problem Relation Age of Onset   Heart disease Sister        Tumor?   Arthritis Other    Lung disease Other    Asthma Other    Diabetes Other    Thyroid disease Neg Hx     Social History: Social History   Socioeconomic History   Marital status: Married    Spouse name: Not on file   Number of children: Not on file   Years of education: 12   Highest education level: Not on file  Occupational History   Not on file  Tobacco Use   Smoking status: Some Days    Current packs/day: 0.00    Average packs/day: 1 pack/day for 30.0 years (30.0 ttl pk-yrs)    Types: Cigarettes    Start date: 12/19/1989    Last attempt to quit: 12/20/2019    Years since quitting: 3.7   Smokeless tobacco: Never  Vaping Use   Vaping status: Never Used  Substance and Sexual Activity   Alcohol use: Not Currently    Comment: Prior history of excessive intake   Drug use: Not Currently    Types: Methamphetamines   Sexual activity: Not on file  Other Topics Concern   Not on file  Social History Narrative    Patient was born and raised in [place]. He denies any issues with walking or talking as a child. He completed the 9th grade. He is currently financially supported through disability. He is married and he has four children he is estranged from. He is currently living with his wife.    Social Determinants of Health   Financial Resource Strain: Medium Risk (05/17/2022)   Overall Financial Resource Strain (CARDIA)    Difficulty of Paying Living Expenses: Somewhat hard  Food Insecurity: No Food Insecurity (07/08/2023)   Hunger Vital Sign    Worried About Running Out of Food in the Last Year: Never true    Ran Out of Food in the Last Year: Never true  Transportation Needs: No Transportation Needs (07/08/2023)   PRAPARE - Administrator, Civil Service (Medical): No    Lack of Transportation (Non-Medical): No  Physical Activity: Inactive (11/10/2020)   Exercise Vital Sign    Days of Exercise per Week: 0 days    Minutes of Exercise per Session: 0 min  Stress: Not on file  Social Connections: Not on file    Allergies:  Allergies  Allergen Reactions   Vancomycin Nausea And Vomiting and Other (See Comments)    Severe joint pain    Objective:    Vitals:   09/08/23 1703  BP: 107/64  Pulse: (!) 111  Resp: 18  Temp: 98.2 F (36.8 C)  SpO2: 100%  Physical Exam  General:  appears distressed; SOB HEENT: Normal Neck: supple. JVP flat;  Cor: Normal LVAD hum Lungs: audible expiratory wheezing Abdomen: soft, nontender, nondistended. No hepatosplenomegaly. No bruits or masses. Good bowel sounds. Large hernia Driveline: C/D/I; DL secured Extremities: no cyanosis, clubbing, rash, edema Neuro: alert & orientedx3, cranial nerves grossly intact. moves all 4 extremities w/o difficulty. Affect pleasant   Telemetry   Not on tele yet  EKG   ST with PACs (reviewed from 2/24)  Labs    - Labs pending   Imaging    No results found.  Patient Profile:  Kevin Underwood. Is a 62 y.o. 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 type 2 diabetes mellitus, COPD, and hypertension. Now s/p Heartmate 3 LVAD.   Presented to AHF clinic today after LVAD alarming driveline fault. Has had multiple falls. Suspected DL fracture and DL infection. AHF team to admit.  Assessment/Plan:   1.  Sepsis secondary to driveline infection: MRSA driveline infection, admitted in 6/23 and again in 7/23-8/23 with multiple trips to the OR for debridement.  Cultures have grown acinetobacter, MRSA and pseudomonas He was readmitted with driveline infection in 2/24, completed course of daptomycin for MRSA.  Discharged from the hospital on 09/04/2023 with recurrent driveline infection growing Pseudomonas.   On chronic minocycline and ciprofloxacin at home. -His symptoms today are concerning for early sepsis.  His driveline does not have as much drainage as previously; unsure about the etiology of his current symptoms.  In addition, on exam he is very short of breath with audible expiratory wheezing.  Will assess for possible COVID-19. -CT CAP today; procalcitonin and cultures pending. Will start empiric abx coverage after   2.  Chronic systolic CHF s/p Heartmate 3 LVAD on 12/10/2 - Euvolemic on exam.  - Continue current Toprol XL 25 mg daily.  - Continue warfarin, INR goal 2-2.5.  3. 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  - sCr previously within normal limits; repeat labs pending.   4.  CAD S/p CABG 12/20 - Continue Crestor 20 mg daily.   5.  Paroxysmal atrial fibrillation status post maze and left atrial appendage clip - Continue Toprol XL 25 mg daily.  - Off amiodarone with hyperthyroidism.  6. Type 2 diabetes -SSI  7. Methamphetamine abuse: Per wife has  been using intermittently.  - UDS pending.   8. 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  Followed by endocrinology. He is now off amiodarone.  - Continue methimazole  - Check TSH, free T3, free T4.   9. Anemia (chronic blood loss): labs pending  10. COPD: Prior smoker.  - Continue Trelegy.   11. PVCs/NSVT: Now off amiodarone.   12.  Gout: Continue allopurinol, he has prn colchicine.   I reviewed the LVAD parameters from today, and compared the results to the patient's prior recorded data.  No programming changes were made.  The LVAD is functioning within specified parameters.  The patient performs LVAD self-test daily.  LVAD interrogation was negative for any significant power changes, alarms or PI events/speed drops.  LVAD equipment check completed and is in good working order.  Back-up equipment present.   LVAD education done on emergency procedures and precautions and reviewed exit site care.  Length of Stay: 0  Linsey Arteaga,  DO 09/08/2023, 5:53 PM  VAD Team Pager [phone] (7am - 7am) +++VAD ISSUES ONLY+++  Advanced Heart Failure Team Pager [phone] (M-F; 7a - 5p)  Please contact [facility] for night-coverage after hours (5p -7a ) and weekends on [email or web address] for all non- LVAD Issues    5:53 PM

## 2023-09-08 NOTE — Progress Notes (Signed)
ANTICOAGULATION CONSULT NOTE - Initial Consult  Pharmacy Consult for Warfarin Indication:  VAD  Allergies  Allergen Reactions   Vancomycin Nausea And Vomiting and Other (See Comments)    Severe joint pain    Patient Measurements: Height: 5\' 10"  (177.8 cm) Weight: 96.1 kg (211 lb 13.8 oz) IBW/kg (Calculated) : 73  Vital Signs: Temp: 98.2 F (36.8 C) (09/30 1703) BP: 113/86 (09/30 1930) Pulse Rate: 100 (09/30 1930)  Labs: Recent Labs    09/08/23 1940  HGB 12.2*  HCT 37.5*  PLT 230    Estimated Creatinine Clearance: 74.8 mL/min (by C-G formula based on SCr of 1.19 mg/dL).   Medical History: Past Medical History:  Diagnosis Date   "    Arthritis    CAD (coronary artery disease)    a. s/p CABG in 11/2019 with LIMA-LAD, SVG-OM1, SVG-PDA and SVG-D1   CHF (congestive heart failure) (HCC)    a. EF < 20% by echo in 11/2019   Essential hypertension    PAF (paroxysmal atrial fibrillation) (HCC)    Type 2 diabetes mellitus (HCC)     Medications:  (Not in a hospital admission)  Scheduled:  Infusions:   ceFEPime (MAXIPIME) IV 2 g (09/08/23 1938)   DAPTOmycin (CUBICIN) 800 mg in sodium chloride 0.9 % IVPB     PRN:   Assessment: 62 yom with concern for driveline fracture and infection. Patient was on ciprofloxacin and minocycline as outpatient for driveline infection following cefepime/daptomycin course. Warfarin per pharmacy consult placed for VAD indication.  Patient taking warfarin prior to arrival. Home dose is 4 mg T/Th and 6 mg AOD. Last taken 9/29 1630.  9/17 anticoag clinic visit with INR 1.7  PT / INR today is 20.7 / 1.8, which is sub-therapeutic Hgb 12.2; plt 230  Goal of Therapy:  INR Goal 2-2.5 Monitor platelets by anticoagulation protocol: Yes   Plan:  Give 8mg  today - repeat dosing per INR Monitor for s/s of hemorrhage, daily INR, CBC Watch for new DDIs  Delmar Landau, PharmD, BCPS 09/08/2023 8:15 PM ED Clinical Pharmacist -   [phone]

## 2023-09-08 NOTE — ED Triage Notes (Signed)
Pt c/o dizziness and SOB started at 1100 today.

## 2023-09-08 NOTE — ED Provider Notes (Signed)
[place] EMERGENCY DEPARTMENT AT [facility] Provider Note   CSN: [id number] Arrival date & time: 09/08/23  1604     History {Add pertinent medical, surgical, social history, OB history to HPI:1} Chief Complaint  Patient presents with   Dizziness    Kevin Underwood. is a 62 y.o. male.  He has a history of diabetes and cardiomyopathy, has an LVAD in place.  He has had longstanding problems with the LVAD with infection.  Presenting here with dizziness shortness of breath.  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  The history is provided by the patient.  Dizziness Quality:  Lightheadedness Progression:  Unchanged Chronicity:  Recurrent Relieved by:  Nothing Worsened by:  Movement Ineffective treatments:  None tried Associated symptoms: shortness of breath   Associated symptoms: no chest pain, no headaches and no syncope   Risk factors: heart disease        Home Medications Prior to Admission medications   Medication Sig Start Date End Date Taking? Authorizing Provider  acetaminophen (TYLENOL) 325 MG tablet Take 2 tablets (650 mg total) by mouth every 4 (four) hours as needed for headache or mild pain. 02/20/23   Alen Bleacher, NP  albuterol (PROVENTIL) (2.5 MG/3ML) 0.083% nebulizer solution INHALE 3 ML BY NEBULIZATION EVERY 6 HOURS AS NEEDED FOR WHEEZING OR SHORTNESS OF BREATH Patient taking differently: Take 2.5 mg by nebulization every 6 (six) hours as needed for shortness of breath. 09/10/22   Laurey Morale, MD  albuterol (VENTOLIN HFA) 108 (90 Base) MCG/ACT inhaler Inhale 2 puffs into the lungs every 6 (six) hours as needed for wheezing or shortness of breath.    [provider]  allopurinol (ZYLOPRIM) 100 MG tablet Take 1 tablet (100 mg total) by mouth daily. 12/04/21   Laurey Morale, MD  amLODipine (NORVASC) 10 MG tablet Take 1 tablet (10 mg total) by mouth daily. 08/16/23   Andrey Farmer, PA-C  ciprofloxacin (CIPRO) 750 MG tablet Take 1 tablet (750 mg total) by mouth 2 (two) times daily. 08/15/23   Andrey Farmer, PA-C  clonazePAM (KLONOPIN) 0.25 MG disintegrating tablet Take 1 tablet (0.25 mg total) by mouth 2 (two) times daily. Patient not taking: Reported on 08/26/2023 03/24/23   Alen Bleacher, NP  colchicine 0.6 MG tablet Take 1 tablet (0.6 mg total) by mouth daily as needed (gout pain). 04/14/23   Laurey Morale, MD  COMBIVENT RESPIMAT 20-100 MCG/ACT AERS respimat Inhale 1 puff into the lungs every 6 (six) hours. 09/10/22   [provider]  fluticasone Aleda Grana ALLERGY RELIEF) 50 MCG/ACT nasal spray Place 2 sprays into both nostrils as needed for allergies or rhinitis.    [provider]  gabapentin (NEURONTIN) 300 MG capsule Take 1 capsule (300 mg total) by mouth 3 (three) times daily. 08/15/23   Andrey Farmer, PA-C  glipiZIDE (GLUCOTROL) 5 MG tablet Take 1 tablet (5 mg total) by mouth daily. 04/14/23 04/13/24  Laurey Morale, MD  guaifenesin (HUMIBID E) 400 MG TABS tablet Take 0.4 mg by mouth every 6 (six) hours as needed (for congestion).    [provider]  loratadine (CLARITIN) 10 MG tablet Take 1 tablet (10 mg total) by mouth daily. 04/14/23   Laurey Morale, MD  losartan (COZAAR) 50 MG tablet Take 1 tablet (50 mg total) by mouth daily. 08/16/23   Andrey Farmer, PA-C  metFORMIN (GLUCOPHAGE) 1000 MG  tablet Take 1 tablet (1,000 mg total) by mouth 2 (two) times daily. Patient taking differently: Take 500 mg by mouth 2 (two) times daily. 04/14/23   Laurey Morale, MD  metoprolol succinate (TOPROL XL) 25 MG 24 hr tablet Take 1 tablet (25 mg total) by mouth daily. 08/16/23   Andrey Farmer, PA-C  minocycline (MINOCIN) 100 MG capsule Take 2 capsules (200 mg total) by mouth 2 (two) times daily. 08/15/23   Andrey Farmer, PA-C  mirtazapine (REMERON) 15 MG tablet Take 1 tablet (15 mg total) by mouth at bedtime. 04/14/23    Laurey Morale, MD  Multiple Vitamin (MULTIVITAMIN WITH MINERALS) TABS tablet Take 1 tablet by mouth daily. 02/20/23   Alen Bleacher, NP  Multiple Vitamin (MULTIVITAMIN) tablet Take 1 tablet by mouth daily.    [provider]  Oxycodone HCl 10 MG TABS Take 1 tablet (10 mg total) by mouth every 6 (six) hours as needed. 08/15/23   Andrey Farmer, PA-C  pantoprazole (PROTONIX) 40 MG tablet Take 1 tablet (40 mg total) by mouth daily. 04/14/23   Laurey Morale, MD  rosuvastatin (CRESTOR) 20 MG tablet Take 1 tablet (20 mg total) by mouth daily. 08/15/23   Andrey Farmer, PA-C  sertraline (ZOLOFT) 50 MG tablet Take 1 tablet (50 mg total) by mouth daily. 04/14/23   Laurey Morale, MD  spironolactone (ALDACTONE) 25 MG tablet Take 0.5 tablets (12.5 mg total) by mouth daily. 08/16/23   Andrey Farmer, PA-C  tamsulosin (FLOMAX) 0.4 MG CAPS capsule Take 1 capsule (0.4 mg total) by mouth daily. 04/14/23   Laurey Morale, MD  thiamine (VITAMIN B-1) 100 MG tablet Take 1 tablet (100 mg total) by mouth daily. 02/20/23   Alen Bleacher, NP  traMADol (ULTRAM) 50 MG tablet Take 2 tablets (100 mg total) by mouth every 12 (twelve) hours as needed. 08/15/23   Andrey Farmer, PA-C  traZODone (DESYREL) 150 MG tablet Take 1 tablet (150 mg total) by mouth at bedtime. Patient not taking: Reported on 08/26/2023 03/11/22   Laurey Morale, MD  TRELEGY ELLIPTA 100-62.5-25 MCG/ACT AEPB Inhale 1 puff into the lungs daily. 04/14/23   Laurey Morale, MD  warfarin (COUMADIN) 4 MG tablet Take 1-1.5 tablets (4-6 mg total) by mouth See admin instructions. Take 1 tablet (4 mg) every Tuesday and Thursday, Take 1.5 tabs (6 mg) all other days 08/15/23   Andrey Farmer, PA-C  zinc sulfate 220 (50 Zn) MG capsule Take 1 capsule (220 mg total) by mouth daily. Patient not taking: Reported on 07/07/2023 02/20/23   Alen Bleacher, NP      Allergies    Vancomycin    Review of Systems   Review of Systems  Respiratory:   Positive for shortness of breath.   Cardiovascular:  Negative for chest pain and syncope.  Neurological:  Positive for dizziness. Negative for headaches.    Physical Exam Updated Vital Signs BP 107/64   Pulse (!) 111   Temp 98.2 F (36.8 C)   Resp 18   Ht 5\' 10"  (1.778 m)   Wt 96.1 kg   SpO2 100%   BMI 30.40 kg/m  Physical Exam Vitals and nursing note reviewed.  Constitutional:      General: He is not in acute distress.    Appearance: Normal appearance. He is well-developed. He is ill-appearing.  HENT:     Head: Normocephalic and atraumatic.  Eyes:     Conjunctiva/sclera:  Conjunctivae normal.  Cardiovascular:     Heart sounds: No murmur heard.    Comments: LVAD humming Pulmonary:     Effort: Pulmonary effort is normal. No respiratory distress.     Breath sounds: Normal breath sounds.  Abdominal:     Palpations: Abdomen is soft.     Tenderness: There is no abdominal tenderness. There is no guarding or rebound.     Hernia: A hernia is present.  Musculoskeletal:        General: No deformity.     Cervical back: Neck supple.  Skin:    General: Skin is warm and dry.     Capillary Refill: Capillary refill takes less than 2 seconds.  Neurological:     General: No focal deficit present.     Mental Status: He is alert.     ED Results / Procedures / Treatments   Labs (all labs ordered are listed, but only abnormal results are displayed) Labs Reviewed  SARS CORONAVIRUS 2 BY RT PCR  BASIC METABOLIC PANEL  CBC  BRAIN NATRIURETIC PEPTIDE  RAPID URINE DRUG SCREEN, HOSP PERFORMED  TROPONIN I (HIGH SENSITIVITY)    EKG None  Radiology No results found.  Procedures Procedures  {Document cardiac monitor, telemetry assessment procedure when appropriate:1}  Medications Ordered in ED Medications  ondansetron (ZOFRAN-ODT) disintegrating tablet 8 mg (8 mg Oral Given 09/08/23 1808)    ED Course/ Medical Decision Making/ A&P   {   Click here for ABCD2, HEART and other  calculatorsREFRESH Note before signing :1}                              Medical Decision Making Risk Decision regarding hospitalization.   This patient complains of ***; this involves an extensive number of treatment Options and is a complaint that carries with it a high risk of complications and morbidity. The differential includes ***  I ordered, reviewed and interpreted labs, which included *** I ordered medication *** and reviewed PMP when indicated. I ordered imaging studies which included *** and I independently    visualized and interpreted imaging which showed *** Additional history obtained from *** Previous records obtained and reviewed *** I consulted *** and discussed lab and imaging findings and discussed disposition.  Cardiac monitoring reviewed, *** Social determinants considered, *** Critical Interventions: ***  After the interventions stated above, I reevaluated the patient and found *** Admission and further testing considered, ***   {Document critical care time when appropriate:1} {Document review of labs and clinical decision tools ie heart score, Chads2Vasc2 etc:1}  {Document your independent review of radiology images, and any outside records:1} {Document your discussion with family members, caretakers, and with consultants:1} {Document social determinants of health affecting pt's care:1} {Document your decision making why or why not admission, treatments were needed:1} Final Clinical Impression(s) / ED Diagnoses Final diagnoses:  None    Rx / DC Orders ED Discharge Orders     None

## 2023-09-08 NOTE — Progress Notes (Signed)
LVAD Coordinator ED Encounter  [name] a 62 y.o. male that presented to [facility] ER today due to shortness of breath and congestion. He has a past medical history  has a past medical history of ", Arthritis, CAD (coronary artery disease), CHF (congestive heart failure) (HCC), Essential hypertension, PAF (paroxysmal atrial fibrillation) (HCC), and Type 2 diabetes mellitus (HCC).  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   Pt met in waiting room upon arrival and visibly short of breath with audible wheezing. Pt reports new onset dizziness along with generalized weakness and fatigue. Pt recently discharged 08/15/23 following admission for drive line infection requiring IV antibiotics. Pt has been non-compliant with follow up post-discharge for wound care. Wife [name] states he has been compliant with his medications and his drive line site seems to be improving. Dressing clean,dry and intact with anchor secure. VAD Coordinator unable to assess fully as pt is in a wheelchair in the waiting room. Pt denies fever, chills or pain at the exit site.   Vital signs: Temp: 98.2 HR: 111 Doppler MAP:  Automated BP:107/64 (78) O2 Sat: 100% RA  LVAD interrogation reveals:  Speed: 5600 Flow: 4.7 Power:4.3w   PI: 3.1  Alarms: none Events: > 100 PI events  Drive Line: Dressing maintained daily by wife [name] at home. VASHE wet to dry. Dressing CDI and anchor secure. Next dressing change: 09/09/23 by bedside nurse or VAD Coordinator.  Significant Events with LVAD: Patient had a prolonged admission in 6/23 and again in 7/23-8/23 with MRSA driveline infection.  He went to the OR multiple times for debridement and wound vac changes.  He completed vancomycin and was placed on doxycycline.    Levofloxacin added d/t increased drainage and redness after last f/u on 09/27. Wound culture grew acinetobacter.  Wound initially improved but cultures repeated d/t decreased  healing over last few dressing changes. Levofloxacin recently stopped and back on doxy.  Grew acinetobacter and MRSA on culture. ICD recommended switching to minocycline 200 BID based on culture results.   Patient was admitted with swollen and erythematous right wrist in 10/23.  There was concern for septic arthritis. CT showed no effusion that could be tapped. Suspect this was likely a gout flare as it improved rapidly with colchicine.    Patient admitted on 07/07/23-08/15/23 with possible driveline fracture and recurrent driveline infection. Multiple falls reported at home. Pt reported "driveline fault" alarms for about 2 months. Modular cable and controller changed upon admission with resolution in alarms. Drive line wound cultures grew Pseudomonas aeruginosa and Staph aureus. ID consulted and he was treated with 4 wks of IV abx w/ Dapto, Cefepime, and minocycline. Pt underwent wound debridement in OR with Dr. Maren Beach x 2. ID recommended PO minocycline and ciprofloxacin to complete 6 weeks course. EOT 08/28/23. Then can continue minocycline for suppression of MRSA along with Acinetobacter and reassess need of ciprofloxacin for pseudomonas suppression.   Updated VAD Providers Dr. Gasper Lloyd about the above. No LVAD issues and pump is functioning as expected. Able to independently manage LVAD equipment. No LVAD needs at this time.    Simmie Davies RN, BSN VAD Coordinator 24/7 Pager [phone]

## 2023-09-09 DIAGNOSIS — R509 Fever, unspecified: Secondary | ICD-10-CM | POA: Diagnosis not present

## 2023-09-09 DIAGNOSIS — I255 Ischemic cardiomyopathy: Secondary | ICD-10-CM | POA: Diagnosis not present

## 2023-09-09 DIAGNOSIS — R5383 Other fatigue: Secondary | ICD-10-CM

## 2023-09-09 DIAGNOSIS — T827XXA Infection and inflammatory reaction due to other cardiac and vascular devices, implants and grafts, initial encounter: Secondary | ICD-10-CM

## 2023-09-09 DIAGNOSIS — Z95811 Presence of heart assist device: Secondary | ICD-10-CM | POA: Diagnosis not present

## 2023-09-09 DIAGNOSIS — A419 Sepsis, unspecified organism: Secondary | ICD-10-CM | POA: Diagnosis not present

## 2023-09-09 LAB — RAPID URINE DRUG SCREEN, HOSP PERFORMED
Amphetamines: NOT DETECTED
Barbiturates: NOT DETECTED
Benzodiazepines: NOT DETECTED
Cocaine: NOT DETECTED
Opiates: NOT DETECTED
Tetrahydrocannabinol: NOT DETECTED

## 2023-09-09 LAB — CBC
HCT: 34.2 % — ABNORMAL LOW (ref 39.0–52.0)
Hemoglobin: 11.3 g/dL — ABNORMAL LOW (ref 13.0–17.0)
MCH: 26.3 pg (ref 26.0–34.0)
MCHC: 33 g/dL (ref 30.0–36.0)
MCV: 79.5 fL — ABNORMAL LOW (ref 80.0–100.0)
Platelets: 188 10*3/uL (ref 150–400)
RBC: 4.3 MIL/uL (ref 4.22–5.81)
RDW: 14.2 % (ref 11.5–15.5)
WBC: 14.9 10*3/uL — ABNORMAL HIGH (ref 4.0–10.5)
nRBC: 0 % (ref 0.0–0.2)

## 2023-09-09 LAB — RESPIRATORY PANEL BY PCR

## 2023-09-09 LAB — LACTATE DEHYDROGENASE: LDH: 117 U/L (ref 98–192)

## 2023-09-09 LAB — BASIC METABOLIC PANEL
Anion gap: 9 (ref 5–15)
BUN: 17 mg/dL (ref 8–23)
CO2: 24 mmol/L (ref 22–32)
Calcium: 8.5 mg/dL — ABNORMAL LOW (ref 8.9–10.3)
Chloride: 98 mmol/L (ref 98–111)
Creatinine, Ser: 1.29 mg/dL — ABNORMAL HIGH (ref 0.61–1.24)
GFR, Estimated: >60 mL/min (ref >60)
Glucose, Bld: 262 mg/dL — ABNORMAL HIGH (ref 70–99)
Potassium: 3.8 mmol/L (ref 3.5–5.1)
Sodium: 131 mmol/L — ABNORMAL LOW (ref 135–145)

## 2023-09-09 LAB — GLUCOSE, CAPILLARY
Glucose-Capillary: 171 mg/dL — ABNORMAL HIGH (ref 70–99)
Glucose-Capillary: 292 mg/dL — ABNORMAL HIGH (ref 70–99)
Glucose-Capillary: 238 mg/dL — ABNORMAL HIGH (ref 70–99)
Glucose-Capillary: 303 mg/dL — ABNORMAL HIGH (ref 70–99)

## 2023-09-09 LAB — PROTIME-INR
INR: 2.5 — ABNORMAL HIGH (ref 0.8–1.2)
Prothrombin Time: 27 s — ABNORMAL HIGH (ref 11.4–15.2)

## 2023-09-09 LAB — MRSA NEXT GEN BY PCR, NASAL: MRSA by PCR Next Gen: NOT DETECTED

## 2023-09-09 MED ORDER — SODIUM CHLORIDE 0.9 % IV SOLN
8.0000 mg/kg | Freq: Every day | INTRAVENOUS | Status: DC
  Filled 2023-09-09: qty 14

## 2023-09-09 MED ORDER — INSULIN ASPART 100 UNIT/ML IJ SOLN
0.0000 [IU] | Freq: Three times a day (TID) | INTRAMUSCULAR | Status: DC
  Administered 2023-09-09 – 2023-09-10 (×3): 5 [IU] via SUBCUTANEOUS

## 2023-09-09 MED ORDER — IPRATROPIUM-ALBUTEROL 0.5-2.5 (3) MG/3ML IN SOLN
3.0000 mL | Freq: Four times a day (QID) | RESPIRATORY_TRACT | Status: DC
  Administered 2023-09-09 – 2023-09-10 (×5): 3 mL via RESPIRATORY_TRACT
  Filled 2023-09-09 (×5): qty 3

## 2023-09-09 MED ORDER — ACETAMINOPHEN 325 MG PO TABS: 650.0000 mg | ORAL_TABLET | ORAL | Status: DC | PRN

## 2023-09-09 MED ORDER — WARFARIN SODIUM 2 MG PO TABS
2.0000 mg | ORAL_TABLET | Freq: Once | ORAL | Status: AC
  Administered 2023-09-09: 2 mg via ORAL
  Filled 2023-09-09: qty 1

## 2023-09-09 MED ORDER — METOPROLOL SUCCINATE ER 25 MG PO TB24
12.5000 mg | ORAL_TABLET | Freq: Every day | ORAL | Status: DC
  Administered 2023-09-09: 12.5 mg via ORAL
  Filled 2023-09-09: qty 1

## 2023-09-09 MED ORDER — INSULIN ASPART 100 UNIT/ML IJ SOLN
0.0000 [IU] | Freq: Every day | INTRAMUSCULAR | Status: DC
  Administered 2023-09-09: 4 [IU] via SUBCUTANEOUS

## 2023-09-09 MED ORDER — INSULIN GLARGINE-YFGN 100 UNIT/ML ~~LOC~~ SOLN
8.0000 [IU] | Freq: Every day | SUBCUTANEOUS | Status: DC
  Administered 2023-09-09: 8 [IU] via SUBCUTANEOUS
  Filled 2023-09-09 (×2): qty 0.08

## 2023-09-09 NOTE — Progress Notes (Signed)
Patient with fever 100.3 Tylenol administered per order.

## 2023-09-09 NOTE — Plan of Care (Signed)
  Problem: Education: Goal: Patient will understand all VAD equipment and how it functions Outcome: Progressing Goal: Patient will be able to verbalize current INR target range and antiplatelet therapy for discharge home Outcome: Progressing   Problem: Cardiac: Goal: LVAD will function as expected and patient will experience no clinical alarms Outcome: Progressing   Problem: Health Behavior/Discharge Planning: Goal: Ability to manage health-related needs will improve Outcome: Progressing   Problem: Clinical Measurements: Goal: Ability to maintain clinical measurements within normal limits will improve Outcome: Progressing Goal: Diagnostic test results will improve Outcome: Progressing Goal: Respiratory complications will improve Outcome: Progressing Goal: Cardiovascular complication will be avoided Outcome: Progressing   Problem: Nutrition: Goal: Adequate nutrition will be maintained Outcome: Progressing   Problem: Coping: Goal: Level of anxiety will decrease Outcome: Progressing   Problem: Elimination: Goal: Will not experience complications related to bowel motility Outcome: Progressing Goal: Will not experience complications related to urinary retention Outcome: Progressing   Problem: Pain Managment: Goal: General experience of comfort will improve Outcome: Progressing   Problem: Safety: Goal: Ability to remain free from injury will improve Outcome: Progressing   Problem: Skin Integrity: Goal: Risk for impaired skin integrity will decrease Outcome: Progressing   Problem: Coping: Goal: Ability to adjust to condition or change in health will improve Outcome: Progressing   Problem: Fluid Volume: Goal: Ability to maintain a balanced intake and output will improve Outcome: Progressing   Problem: Health Behavior/Discharge Planning: Goal: Ability to identify and utilize available resources and services will improve Outcome: Progressing Goal: Ability to manage  health-related needs will improve Outcome: Progressing   Problem: Metabolic: Goal: Ability to maintain appropriate glucose levels will improve Outcome: Progressing   Problem: Nutritional: Goal: Maintenance of adequate nutrition will improve Outcome: Progressing Goal: Progress toward achieving an optimal weight will improve Outcome: Progressing   Problem: Skin Integrity: Goal: Risk for impaired skin integrity will decrease Outcome: Progressing   Problem: Tissue Perfusion: Goal: Adequacy of tissue perfusion will improve Outcome: Progressing

## 2023-09-09 NOTE — Consult Note (Signed)
[facility]    Date of Admission:  09/08/2023      Total days of antibiotics 1   Daptomycin                Reason for Consult: Fever, fatigue, chronic LVAD DLI     Referring Provider: Sabharwal  Primary Care Provider: [name], FNP   Assessment: [name] is a 62 y.o. 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 He was admitted due to worsening fatigue, body aches, chills, malaise over w/e with concern for sepsis.   Fevers, Malaise, Bodyaches, Chills, Runny Nose -  -constellation of symptoms seem more c/w viral syndrome; will run RVP multiplex (COVID / Flu negative) -Droplet while we wait on RVP  -Chest CT w/o any evidence of lower respiratory tract involvement  -SOB / Expiratory wheezes on exam currently getting breathing treatment.  -he has increased cigarette smoking recently and attributes some of his wheezing to that    Chronic LVAD Driveline Infection -  MRSA, Acinetobacter Baumannii, PsA -  -PVT assessed site / CT. No surgical indications as the proximity of cord to the OFG and large hernia complicate candidacy. His pain / discomfort is stable with known incisional hernia as well. -Continue IV cefepime and Daptomycin for now pending maturity of blood cultures. Suspect we can put him back on oral regimen soon.  -wound care management per VAD team -- increase to daily wound dressings with vashe noted    Plan: RVP please Droplet until #1 returns  Continue IV daptomycin + cefepime for now, continue oral minocycline for known A Baumannii --> if this regrows will send for another MIC check to ensure Minocycline is still active, though have not seen this regrow since 09/04/2022 --> was cipro + mino sensitive then  Follow blood cultures    Principal Problem:   Sepsis (HCC)    gabapentin  300 mg Oral TID    insulin aspart  0-5 Units Subcutaneous QHS   insulin aspart  0-9 Units Subcutaneous TID WC   insulin glargine-yfgn  8 Units Subcutaneous Daily   ipratropium-albuterol  3 mL Inhalation Q6H   metoprolol succinate  12.5 mg Oral Daily   minocycline  200 mg Oral BID   mirtazapine  15 mg Oral QHS   pantoprazole  40 mg Oral Daily   rosuvastatin  20 mg Oral Daily   sertraline  50 mg Oral Daily   tamsulosin  0.4 mg Oral Daily   warfarin  2 mg Oral Once   Warfarin - Pharmacist Dosing Inpatient   Does not apply q1600    HPI: Amitai Delaughter. is a 62 y.o. male with known chronic lvad infection involving exit site with MRSA, Pseudomonas and Acinetobacter baumanii (remote, 08-2022).   He developed fatigue/malaise, body aches, chills and runny nose with SOB/wheezing over the weekend.  Denies any sore throat, chest pains. Having some coughing. No diarrhea. Has been 100% compliant with minocycline and ciprofloxacin both BID.   Has not smoked methamphetamines in months - has increased cigarette use lately  Lives at home with his wife and son. Not working at all. Not taking many trips outside the house.   He feels a little better today. No change in driveline drainage from what he has noted. No difference in pain around site.    Review of Systems: Review of  Systems  Constitutional:  Positive for chills and malaise/fatigue.  HENT:  Positive for congestion. Negative for sore throat.   Respiratory:  Positive for shortness of breath and wheezing.   Cardiovascular:  Negative for chest pain.    Past Medical History:  Diagnosis Date   "    Arthritis    CAD (coronary artery disease)    a. s/p CABG in 11/2019 with LIMA-LAD, SVG-OM1, SVG-PDA and SVG-D1   CHF (congestive heart failure) (HCC)    a. 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   Arthritis Other    Lung disease Other    Asthma Other    Diabetes Other    Thyroid disease Neg Hx    Allergies  Allergen Reactions   Firvanq [Vancomycin] Nausea And Vomiting and Other (See Comments)    Arthralgias     OBJECTIVE: Blood pressure 100/79, pulse (!) 104, temperature 100.3 F (37.9 C), temperature source Oral, resp. rate 19, height 5\' 10"  (1.778 m), weight 88 kg, SpO2 94%.  Physical Exam Constitutional:      Appearance: He is not ill-appearing.  Eyes:     General: No scleral icterus.       Right eye: No discharge.        Left eye: No discharge.     Pupils: Pupils are equal, round, and reactive to light.  Cardiovascular:     Rate and Rhythm: Normal rate.  Pulmonary:     Effort: Pulmonary effort is normal.     Breath sounds: Normal breath sounds.     Comments: No wheezing while currently getting breathing treatment  Abdominal:     Hernia: A hernia is present.     Comments: Large incisional hernia defect noted. Photos reviewed of LVAD exit site  Musculoskeletal:     Cervical back: Normal range of motion.  Lymphadenopathy:     Cervical: No cervical adenopathy.  Skin:    General: Skin is warm and dry.  Neurological:     Mental Status: He is alert and oriented to person, place, and time.     Lab Results Lab Results  Component Value Date   WBC 14.9 (H) 09/09/2023   HGB 11.3 (L) 09/09/2023   HCT 34.2 (L) 09/09/2023   MCV 79.5 (L) 09/09/2023   PLT  188 09/09/2023    Lab Results  Component Value Date   CREATININE 1.29 (H) 09/09/2023   BUN 17 09/09/2023   NA 131 (L) 09/09/2023   K 3.8 09/09/2023   CL 98 09/09/2023   CO2 24 09/09/2023    Lab Results  Component Value Date   ALT 16 09/08/2023  AST 17 09/08/2023   ALKPHOS 110 09/08/2023   BILITOT 0.5 09/08/2023     Microbiology: Recent Results (from the past 240 hour(s))  SARS Coronavirus 2 by RT PCR (hospital order, performed in [facility] hospital lab) *cepheid single result test* Anterior Nasal Swab     Status: None   Collection Time: 09/08/23  5:22 PM   Specimen: Anterior Nasal Swab  Result Value Ref Range Status   SARS Coronavirus 2 by RT PCR NEGATIVE NEGATIVE Final    Comment: Performed at [facility], [address], [place], [place] [address]  Culture, blood (routine x 2)     Status: None (Preliminary result)   Collection Time: 09/08/23  7:20 PM   Specimen: BLOOD LEFT ARM  Result Value Ref Range Status   Specimen Description BLOOD LEFT ARM  Final   Special Requests   Final    Blood Culture adequate volume BOTTLES DRAWN AEROBIC AND ANAEROBIC   Culture   Final    NO GROWTH < 12 HOURS Performed at [facility], [address] [place], [place] [address]    Report Status PENDING  Incomplete  Culture, blood (routine x 2)     Status: None (Preliminary result)   Collection Time: 09/08/23  9:08 PM   Specimen: BLOOD LEFT ARM  Result Value Ref Range Status   Specimen Description BLOOD LEFT ARM  Final   Special Requests   Final    BOTTLES DRAWN AEROBIC AND ANAEROBIC Blood Culture adequate volume   Culture   Final    NO GROWTH < 12 HOURS Performed at [facility], [address] [place], [place] [address]    Report Status PENDING  Incomplete  MRSA Next Gen by PCR, Nasal     Status: None   Collection Time: 09/08/23 11:51 PM   Specimen: Nasal Mucosa; Nasal Swab  Result Value Ref Range Status   MRSA by PCR Next Gen NOT DETECTED NOT DETECTED Final     Comment: (NOTE) The GeneXpert MRSA Assay (FDA approved for NASAL specimens only), is one component of a comprehensive MRSA colonization surveillance program. It is not intended to diagnose MRSA infection nor to guide or monitor treatment for MRSA infections. Test performance is not FDA approved in patients less than 62 years old. Performed at [facility], [address] [place], [place] [address]     Rexene Alberts, MSN, NP-C [facility]  [email or web address] Pager: [phone] Office: [phone] [facility] Main Line: [phone] *Secure Chat Communication Welcome

## 2023-09-09 NOTE — Plan of Care (Signed)
  Problem: Cardiac: Goal: LVAD will function as expected and patient will experience no clinical alarms Outcome: Progressing   Problem: Education: Goal: Knowledge of General Education information will improve Description: Including pain rating scale, medication(s)/side effects and non-pharmacologic comfort measures Outcome: Progressing   Problem: Health Behavior/Discharge Planning: Goal: Ability to manage health-related needs will improve Outcome: Progressing   Problem: Clinical Measurements: Goal: Ability to maintain clinical measurements within normal limits will improve Outcome: Progressing Goal: Diagnostic test results will improve Outcome: Progressing   Problem: Activity: Goal: Risk for activity intolerance will decrease Outcome: Progressing   Problem: Nutrition: Goal: Adequate nutrition will be maintained Outcome: Progressing   Problem: Coping: Goal: Level of anxiety will decrease Outcome: Progressing   Problem: Pain Managment: Goal: General experience of comfort will improve Outcome: Progressing   Problem: Safety: Goal: Ability to remain free from injury will improve Outcome: Progressing

## 2023-09-09 NOTE — Progress Notes (Signed)
ANTICOAGULATION CONSULT NOTE  Pharmacy Consult for Warfarin Indication:  VAD  Allergies  Allergen Reactions   Firvanq [Vancomycin] Nausea And Vomiting and Other (See Comments)    Arthralgias     Patient Measurements: Height: 5\' 10"  (177.8 cm) Weight: 88 kg (194 lb 0.1 oz) IBW/kg (Calculated) : 73  Vital Signs: Temp: 99.1 F (37.3 C) (10/01 0822) Temp Source: Oral (10/01 0822) BP: 104/63 (10/01 0822) Pulse Rate: 93 (10/01 0441)  Labs: Recent Labs    09/08/23 1940 09/08/23 2105 09/08/23 2120 09/09/23 0721  HGB 12.2*  --  11.6* 11.3*  HCT 37.5*  --  34.8* 34.2*  PLT 230  --  207 188  LABPROT 20.7*  --   --  27.0*  INR 1.8*  --   --  2.5*  CREATININE 1.23 1.27*  --  1.29*  TROPONINIHS 6 5  --   --     Estimated Creatinine Clearance: 66.3 mL/min (A) (by C-G formula based on SCr of 1.29 mg/dL (H)).   Medical History: Past Medical History:  Diagnosis Date   "    Arthritis    CAD (coronary artery disease)    a. s/p CABG in 11/2019 with LIMA-LAD, SVG-OM1, SVG-PDA and SVG-D1   CHF (congestive heart failure) (HCC)    a. EF < 20% by echo in 11/2019   Essential hypertension    PAF (paroxysmal atrial fibrillation) (HCC)    Type 2 diabetes mellitus (HCC)     Medications:  Medications Prior to Admission  Medication Sig Dispense Refill Last Dose   acetaminophen (TYLENOL) 325 MG tablet Take 2 tablets (650 mg total) by mouth every 4 (four) hours as needed for headache or mild pain.   Past Week   albuterol (PROVENTIL) (2.5 MG/3ML) 0.083% nebulizer solution INHALE 3 ML BY NEBULIZATION EVERY 6 HOURS AS NEEDED FOR WHEEZING OR SHORTNESS OF BREATH (Patient taking differently: Take 2.5 mg by nebulization every 6 (six) hours as needed for shortness of breath.) 450 mL 1 09/08/2023   albuterol (VENTOLIN HFA) 108 (90 Base) MCG/ACT inhaler Inhale 2 puffs into the lungs every 6 (six) hours as needed for wheezing or shortness of breath.   09/08/2023   allopurinol (ZYLOPRIM) 100 MG tablet  Take 1 tablet (100 mg total) by mouth daily. 90 tablet 3 09/08/2023   amLODipine (NORVASC) 10 MG tablet Take 1 tablet (10 mg total) by mouth daily. 30 tablet 5 09/08/2023   ciprofloxacin (CIPRO) 750 MG tablet Take 1 tablet (750 mg total) by mouth 2 (two) times daily. 28 tablet 0 09/08/2023   colchicine 0.6 MG tablet Take 1 tablet (0.6 mg total) by mouth daily as needed (gout pain). (Patient taking differently: Take 0.6 mg by mouth daily.)   09/08/2023   COMBIVENT RESPIMAT 20-100 MCG/ACT AERS respimat Inhale 1 puff into the lungs every 6 (six) hours.   09/08/2023   fluticasone (FLONASE ALLERGY RELIEF) 50 MCG/ACT nasal spray Place 2 sprays into both nostrils as needed for allergies or rhinitis.   Past Month   gabapentin (NEURONTIN) 300 MG capsule Take 1 capsule (300 mg total) by mouth 3 (three) times daily. 90 capsule 0 09/08/2023   glipiZIDE (GLUCOTROL) 5 MG tablet Take 1 tablet (5 mg total) by mouth daily. 30 tablet 11 09/08/2023   guaifenesin (HUMIBID E) 400 MG TABS tablet Take 400 mg by mouth every 6 (six) hours as needed (for congestion).   09/08/2023   loratadine (CLARITIN) 10 MG tablet Take 1 tablet (10 mg total) by mouth daily.  30 tablet 6 09/08/2023   losartan (COZAAR) 50 MG tablet Take 1 tablet (50 mg total) by mouth daily. 30 tablet 5 09/08/2023   metFORMIN (GLUCOPHAGE) 1000 MG tablet Take 1 tablet (1,000 mg total) by mouth 2 (two) times daily. 120 tablet 6 09/08/2023   metoprolol succinate (TOPROL XL) 25 MG 24 hr tablet Take 1 tablet (25 mg total) by mouth daily. 30 tablet 5 09/08/2023   minocycline (MINOCIN) 100 MG capsule Take 2 capsules (200 mg total) by mouth 2 (two) times daily. 60 capsule 11 09/08/2023   mirtazapine (REMERON) 15 MG tablet Take 1 tablet (15 mg total) by mouth at bedtime.   09/07/2023   Multiple Vitamin (MULTIVITAMIN WITH MINERALS) TABS tablet Take 1 tablet by mouth daily.   09/08/2023   pantoprazole (PROTONIX) 40 MG tablet Take 1 tablet (40 mg total) by mouth daily. 90 tablet 2  09/08/2023   rosuvastatin (CRESTOR) 20 MG tablet Take 1 tablet (20 mg total) by mouth daily. 30 tablet 5 09/08/2023   spironolactone (ALDACTONE) 25 MG tablet Take 0.5 tablets (12.5 mg total) by mouth daily. 30 tablet 5 09/08/2023   tamsulosin (FLOMAX) 0.4 MG CAPS capsule Take 1 capsule (0.4 mg total) by mouth daily. 90 capsule 3 09/08/2023   thiamine (VITAMIN B-1) 100 MG tablet Take 1 tablet (100 mg total) by mouth daily.   09/08/2023   traMADol (ULTRAM) 50 MG tablet Take 2 tablets (100 mg total) by mouth every 12 (twelve) hours as needed. 120 tablet 0 09/08/2023   TRELEGY ELLIPTA 100-62.5-25 MCG/ACT AEPB Inhale 1 puff into the lungs daily. 1 each 6 09/08/2023   warfarin (COUMADIN) 4 MG tablet Take 1-1.5 tablets (4-6 mg total) by mouth See admin instructions. Take 1 tablet (4 mg) every Tuesday and Thursday, Take 1.5 tabs (6 mg) all other days 45 tablet 11 09/07/2023 at 1630   Oxycodone HCl 10 MG TABS Take 1 tablet (10 mg total) by mouth every 6 (six) hours as needed. (Patient not taking: Reported on 09/08/2023) 28 tablet 0 Not Taking   sertraline (ZOLOFT) 50 MG tablet Take 1 tablet (50 mg total) by mouth daily. (Patient not taking: Reported on 09/08/2023) 90 tablet 3 Not Taking   traZODone (DESYREL) 150 MG tablet Take 1 tablet (150 mg total) by mouth at bedtime. (Patient not taking: Reported on 08/26/2023) 90 tablet 3 Not Taking   Scheduled:   ciprofloxacin  750 mg Oral BID   gabapentin  300 mg Oral TID   ipratropium-albuterol  3 mL Inhalation Q6H   metoprolol succinate  12.5 mg Oral Daily   minocycline  200 mg Oral BID   mirtazapine  15 mg Oral QHS   pantoprazole  40 mg Oral Daily   rosuvastatin  20 mg Oral Daily   sertraline  50 mg Oral Daily   tamsulosin  0.4 mg Oral Daily   Warfarin - Pharmacist Dosing Inpatient   Does not apply q1600   Infusions:   ceFEPime (MAXIPIME) IV 200 mL/hr at 09/09/23 0453   DAPTOmycin (CUBICIN) 800 mg in sodium chloride 0.9 % IVPB Stopped (09/08/23 2256)   PRN:  acetaminophen, albuterol, ondansetron (ZOFRAN) IV, traMADol  Assessment: 62 yom with concern for driveline fracture and infection. Patient was on ciprofloxacin and minocycline as outpatient for driveline infection following cefepime/daptomycin course. Warfarin per pharmacy consult placed for VAD indication.  Patient taking warfarin prior to arrival. Home dose is 4 mg T/Th and 6 mg AOD. Last taken 9/29 1630.  INR on admission was 1.8 -  increased to 2.5 today (received higher dose warfarin 8 mg last night). Hgb 11.3, plt 188. LDH 117. No s/sx of bleeding.   Goal of Therapy:  INR Goal 2-2.5 Monitor platelets by anticoagulation protocol: Yes   Plan:  Warfarin 2 mg tonight (reduced from PTA regimen) Monitor INR, CBC, and s/sx of bleeding   Thank you for allowing pharmacy to participate in this patient's care,  Sherron Monday, PharmD, BCCCP Clinical Pharmacist  Phone: [phone] 09/09/2023 9:05 AM  Please check AMION for all [facility] Pharmacy phone numbers After 10:00 PM, call Main Pharmacy [phone]

## 2023-09-09 NOTE — Progress Notes (Addendum)
LVAD Coordinator Rounding Note:  Admitted 09/08/23 to Heart Failure service from [facility] with possible sepsis vs viral infection. Pt came to [facility] 09/08/23 complaining of shortness of breath with audible expiratory wheezing along with cough,dizziness and generalized fatigue.   Wife Kennith Center states pt has hardly gotten out of bed since discharge 08/15/23 and his little to no appetite. Pt and wife report compliance with all medications at home. Pt on Minocycline 200mg  BID and Cipro 750mg  BID for drive line infection. Pt no showed appointment with ID. Kennith Center has continued to change drive line dressing daily and reports improvements in wound bed.    Pt lying in bed this morning and states he feels slightly better this morning and was able to get some rest overnight. Still complaining of cough/congestion. Wheezing has improved with nebs. Currently on IV Cefepime 2g q8h and Daptomycin 800mg  daily. ID consulted today.   Vital signs: Temp: 99.1 HR: 105 Doppler Pressure: 96 Automatic BP: 104/63 (91) O2 Sat: 97% on RA Wt: 194 lbs  LVAD interrogation reveals:  Speed: 5600 Flow: 4.5 Power: 4.4 w PI: 2.9 Alarms: none Events: 100+ Hct: 31  Fixed speed:  5600 Low speed limit:  5300  Drive Line: Existing VAD dressing removed and site care performed using sterile technique. Wound bed cleansed with VASHE solution and sterile 4x4 gauze through light manual debridement. Skin surrounding wound bed cleaned with Chlora prep applicators x 2, allowed to dry. VASHE moistened 4 x 4s placed in wound bed, covered with several dry 4 x 4s. Wound tunnels approx 3 cm.  Drive line partially incorporated. Small amount of thick yellow/serosanguinous drainage noted. No redness, tenderness, foul odor or rash noted.  Drive line anchor reapplied. Continue daily wet to dry dressing changes using VASHE solution per bedside RN or VAD coordinator. Next dressing change due 09/11/23 by bedside nurse.      Pt had previous drive line  debridement in OR with placement of instillation wound vac on 07/09/23 with Dr Donata Clay. This was changed to wet/dry on Friday 8/2. Pt unable to undergo further debridement due to risk of injury to the outflow graft as it is just above the drive line. ID consulted recommendations this admission.   CT Chest/Abd/Pelvis 9/30 IMPRESSION: 1. Similar appearance of circumferential thickening of the soft tissues surrounding the drive line which may be chronic. Recurrent infection is not excluded. No fluid collection. 2. No acute intrathoracic, abdominal, or pelvic pathology. 3. Aortic Atherosclerosis (ICD10-I70.0) and Emphysema (ICD10-J43.9).  Labs:  LDH trend: 117  INR trend: 2.5  WBC trend: 17.4>14.9  Anticoagulation Plan: -INR Goal: 2.0 - 2.5 -ASA Dose: none  ICD: N/A  Infection:  09/08/23>>Blood Cultures>>pending 09/08/23>> Aerobic wound culture>>pending  Drips:    Plan/Recommendations:  Page VAD coordinator with equipment issues or driveline problems Daily drive line wet to dry dressing changes with VASHE solution.   Simmie Davies RN,BSN VAD Coordinator  Office: [phone]  24/7 Pager: [phone]

## 2023-09-09 NOTE — Plan of Care (Signed)
  Problem: Education: Goal: Patient will understand all VAD equipment and how it functions Outcome: Progressing Goal: Patient will be able to verbalize current INR target range and antiplatelet therapy for discharge home Outcome: Progressing   Problem: Cardiac: Goal: LVAD will function as expected and patient will experience no clinical alarms Outcome: Progressing   Problem: Education: Goal: Knowledge of General Education information will improve Description: Including pain rating scale, medication(s)/side effects and non-pharmacologic comfort measures Outcome: Progressing   Problem: Health Behavior/Discharge Planning: Goal: Ability to manage health-related needs will improve Outcome: Progressing   Problem: Clinical Measurements: Goal: Ability to maintain clinical measurements within normal limits will improve Outcome: Progressing Goal: Respiratory complications will improve Outcome: Progressing Goal: Cardiovascular complication will be avoided Outcome: Progressing   Problem: Nutrition: Goal: Adequate nutrition will be maintained Outcome: Progressing   Problem: Coping: Goal: Level of anxiety will decrease Outcome: Progressing   Problem: Elimination: Goal: Will not experience complications related to urinary retention Outcome: Progressing

## 2023-09-09 NOTE — Progress Notes (Addendum)
Advanced Heart Failure VAD Team Note  PCP-Cardiologist: Kevin Dell, MD   Subjective:   Admitted with possible sepsis. Currently on cipro, daptomycin, and cefepime.   Bld CX pending.   Complaining of fatigue.   LVAD INTERROGATION:  HeartMate III LVAD:   Flow 4.5  liters/min, speed 5600, power 4 PI 3.5  > 90 PI events   Objective:    Vital Signs:   Temp:  [98.2 F (36.8 C)-100.2 F (37.9 C)] 100.2 F (37.9 C) (10/01 0441) Pulse Rate:  [93-111] 93 (10/01 0441) Resp:  [14-27] 14 (10/01 0441) BP: (94-113)/(64-92) 109/92 (10/01 0441) SpO2:  [94 %-100 %] 98 % (10/01 0441) Weight:  [88 kg-96.1 kg] 88 kg (10/01 0441) Last BM Date : 09/08/23 Mean arterial Pressure 80-100  Intake/Output:   Intake/Output Summary (Last 24 hours) at 09/09/2023 0718 Last data filed at 09/09/2023 0453 Gross per 24 hour  Intake 110.23 ml  Output 800 ml  Net -689.77 ml     Physical Exam    General:   No resp difficulty HEENT: normal Neck: supple. JVP . Carotids 2+ bilat; no bruits. No lymphadenopathy or thyromegaly appreciated. Cor: Mechanical heart sounds with LVAD hum present. Lungs: No wheeze. Decreased in the bases. On Room air.  Abdomen: + hernia, soft, nontender, nondistended. No hepatosplenomegaly. No bruits or masses. Good bowel sounds. incorporated Extremities: no cyanosis, clubbing, rash, edema Neuro: alert & orientedx3, cranial nerves grossly intact. moves all 4 extremities w/o difficulty. Affect flat  Skin: Driveline dressing intact. CDI.    Telemetry   SR  EKG    N/A   Labs   Basic Metabolic Panel: Recent Labs  Lab 09/08/23 1940 09/08/23 2105  NA 131* 132*  K 4.3 4.1  CL 95* 98  CO2 23 23  GLUCOSE 184* 184*  BUN 17 16  CREATININE 1.23 1.27*  CALCIUM 9.1 8.9    Liver Function Tests: Recent Labs  Lab 09/08/23 2105  AST 17  ALT 16  ALKPHOS 110  BILITOT 0.5  PROT 7.7  ALBUMIN 3.4*   No results for input(s): "LIPASE", "AMYLASE" in the last 168  hours. No results for input(s): "AMMONIA" in the last 168 hours.  CBC: Recent Labs  Lab 09/08/23 1940 09/08/23 2120  WBC 17.5* 17.4*  NEUTROABS  --  14.0*  HGB 12.2* 11.6*  HCT 37.5* 34.8*  MCV 80.5 80.9  PLT 230 207    INR: Recent Labs  Lab 09/08/23 1940  INR 1.8*    Other results: EKG:    Imaging   CT CHEST ABDOMEN PELVIS W CONTRAST  Result Date: 09/08/2023 CLINICAL DATA:  Sepsis.  Concern for LVAD driveline infection. EXAM: CT CHEST, ABDOMEN, AND PELVIS WITH CONTRAST TECHNIQUE: Multidetector CT imaging of the chest, abdomen and pelvis was performed following the standard protocol during bolus administration of intravenous contrast. RADIATION DOSE REDUCTION: This exam was performed according to the departmental dose-optimization program which includes automated exposure control, adjustment of the mA and/or kV according to patient size and/or use of iterative reconstruction technique. CONTRAST:  75mL OMNIPAQUE IOHEXOL 350 MG/ML SOLN COMPARISON:  CT of the chest abdomen pelvis dated 07/07/2023. FINDINGS: CT CHEST FINDINGS Cardiovascular: There is no cardiomegaly or pericardial effusion. Advanced 3 vessel coronary vascular calcification and left atrial appendage occlusive device. Left ventricular assist device with associated streak artifact. Similar appearance of circumferential thickening of the soft tissues surrounding the drive line which may be chronic. Recurrent infection is not excluded. No fluid collection. There is moderate atherosclerotic calcification of  the thoracic aorta. No aneurysmal dilatation or dissection. The origins of the great vessels of the aortic arch and the central pulmonary arteries appear patent as visualized. Mediastinum/Nodes: No hilar or mediastinal adenopathy. The esophagus and the thyroid gland are grossly unremarkable. No mediastinal fluid collection. Lungs/Pleura: Background of centrilobular emphysema. No focal consolidation, pleural effusion, or  pneumothorax. Bibasilar linear atelectasis/scarring. The central airways are patent. Musculoskeletal: Median sternotomy wires. Degenerative changes of the spine. No acute osseous pathology. CT ABDOMEN PELVIS FINDINGS No intra-abdominal free air or free fluid. Hepatobiliary: Mild fatty infiltration of the liver. No biliary dilatation. The gallbladder is unremarkable. Pancreas: Unremarkable. No pancreatic ductal dilatation or surrounding inflammatory changes. Spleen: Normal in size without focal abnormality. Adrenals/Urinary Tract: The adrenal glands unremarkable. Vascular calcification versus a 2 mm nonobstructing left renal upper pole calculus. There is no hydronephrosis on either side. There is symmetric enhancement and excretion of contrast by both kidneys. The visualized ureters and urinary bladder appear unremarkable. Stomach/Bowel: Moderate stool throughout the colon. There is no bowel obstruction or active inflammation. The appendix is normal. Vascular/Lymphatic: Advanced aortoiliac atherosclerotic disease. The IVC is unremarkable. No portal venous gas. There is no adenopathy. Reproductive: The prostate and seminal vesicles are grossly unremarkable. No pelvic mass. Other: Similar appearance of laxity of the anterior abdominal wall with a broad-based hernia containing loops of small and large bowel without evidence of obstruction. Musculoskeletal: Osteopenia with degenerative changes of the spine. Lower lumbar fusion. No acute osseous pathology. IMPRESSION: 1. Similar appearance of circumferential thickening of the soft tissues surrounding the drive line which may be chronic. Recurrent infection is not excluded. No fluid collection. 2. No acute intrathoracic, abdominal, or pelvic pathology. 3. Aortic Atherosclerosis (ICD10-I70.0) and Emphysema (ICD10-J43.9). Electronically Signed   By: Elgie Collard M.D.   On: 09/08/2023 23:10   DG Chest 2 View  Result Date: 09/08/2023 CLINICAL DATA:  sob  Pt c/o  dizziness and SOB started at 1100 today. EXAM: CHEST - 2 VIEW COMPARISON:  CT chest 07/07/2023 FINDINGS: LVAD overlies the left lower hemithorax.  Atrial appendage clip. The heart and mediastinal contours are unchanged. Atherosclerotic plaque. Left base atelectasis no focal consolidation. No pulmonary edema. No pleural effusion. No pneumothorax. No acute osseous abnormality. Intact sternotomy wires. Bilateral axillary region vascular clips. IMPRESSION: 1. No active cardiopulmonary disease. 2.  Aortic Atherosclerosis (ICD10-I70.0). Electronically Signed   By: Tish Frederickson M.D.   On: 09/08/2023 20:17     Medications:     Scheduled Medications:  ciprofloxacin  750 mg Oral BID   gabapentin  300 mg Oral TID   ipratropium-albuterol  3 mL Inhalation Q6H   minocycline  200 mg Oral BID   mirtazapine  15 mg Oral QHS   pantoprazole  40 mg Oral Daily   rosuvastatin  20 mg Oral Daily   sertraline  50 mg Oral Daily   tamsulosin  0.4 mg Oral Daily   Warfarin - Pharmacist Dosing Inpatient   Does not apply q1600    Infusions:  ceFEPime (MAXIPIME) IV 200 mL/hr at 09/09/23 0453   DAPTOmycin (CUBICIN) 800 mg in sodium chloride 0.9 % IVPB Stopped (09/08/23 2256)    PRN Medications: acetaminophen, albuterol, ondansetron (ZOFRAN) IV, traMADol   Patient Profile  Kevin Underwood. Is a 62 y.o. 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 type 2 diabetes mellitus, COPD,  and hypertension. s/p Heartmate 3 LVAD.   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   Assessment/Plan:   1.  Sepsis secondary to driveline infection: MRSA driveline infection, admitted in 6/23 and again in 7/23-8/23 with multiple trips to the OR for debridement.  Cultures have grown acinetobacter, MRSA and pseudomonas He was readmitted with  driveline infection in 2/24, completed course of daptomycin for MRSA.  Discharged from the hospital on 09/04/2023 with recurrent driveline infection growing Pseudomonas.   On chronic minocycline and ciprofloxacin at home. Admitted with possible early sepsis.  His driveline does not have as much drainage as previously; unsure about the etiology of his current symptoms.  In addition, on exam he is very short of breath with audible expiratory wheezing.  No wheeze today.  -SARs 2 negative.  -CT CAP- Similar to previous but recurrent infection possible.  - WBC 17.5>14.9 .  - Blood cultures pending.    2.  Chronic systolic CHF s/p Heartmate 3 LVAD on 12/10/2 - Euvolemic on exam.  - BB held on admit. Map 100 today. Restart 12.5 Toprol XL  - Continue warfarin, INR goal 2-2.5.   3. 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  - Stable.     4.  CAD S/p CABG 12/20 - Continue Crestor 20 mg daily.    5.  Paroxysmal atrial fibrillation status post maze and left atrial appendage clip - As above restart 12.5 mg Toprol XL   - Off amiodarone with hyperthyroidism.   6. Type 2 diabetes -SSI   7. 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  - UDS negative.    8. 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  Followed by endocrinology. He is now off amiodarone.  - Continue methimazole    9. Anemia (chronic blood loss): Check CBC  10. COPD: Prior smoker.  - Continue Trelegy.    11. PVCs/NSVT: Now off amiodarone.    12.  Gout: Continue allopurinol, he has prn colchicine.    Dr Alla German aware of admit. Will also let ID know about admit.  WBC trending down. LDH stable. INR 2.5.   I reviewed the LVAD parameters from today, and compared the results to the patient's prior recorded data.  No programming changes were made.  The LVAD is functioning within specified parameters.  The patient performs LVAD self-test daily.  LVAD  interrogation was negative for any significant power changes, alarms or PI events/speed drops.  LVAD equipment check completed and is in good working order.  Back-up equipment present.   LVAD education done on emergency procedures and precautions and reviewed exit site care.  Length of Stay: 1  Tonye Becket, NP 09/09/2023, 7:18 AM  VAD Team --- VAD ISSUES ONLY--- Pager [phone] (7am - 7am)  Advanced Heart Failure Team  Pager [phone] (M-F; 7a - 5p)  Please contact [facility] for night-coverage after hours (5p -7a ) and weekends on [email or web address]

## 2023-09-09 NOTE — TOC Initial Note (Signed)
Transition of Care (TOC) - Initial/Assessment Note    Patient Details  Name: Kevin Underwood. MRN: [id number] Date of Birth: Mar 07, 1961  Transition of Care [facility]) CM/SW Contact:    Reva Bores, LCSWA Phone Number:[phone] 09/09/2023, 12:16 PM  Clinical Narrative: HF CSW met with pt and wife at bedside. Pt stated that they are still interested in [facility] services. Pts wife stated that she really needs the help and that it has become overwhelming for her to take care of him. Pt stated that he is agreeing to do whatever he needs to do for [facility] support. CSW explained that [facility] services can be a challenge based of SA history, but will inquire about options with HF CM.   TOC will continue following.                       Patient Goals and CMS Choice            Expected Discharge Plan and Services                                              Prior Living Arrangements/Services                       Activities of Daily Living   ADL Screening (condition at time of admission) Does the patient have a NEW difficulty with bathing/dressing/toileting/self-feeding that is expected to last >3 days?: No Does the patient have a NEW difficulty with getting in/out of bed, walking, or climbing stairs that is expected to last >3 days?: Yes (Initiates electronic notice to provider for possible PT consult) Does the patient have a NEW difficulty with communication that is expected to last >3 days?: No Is the patient deaf or have difficulty hearing?: No Does the patient have difficulty seeing, even when wearing glasses/contacts?: Yes Does the patient have difficulty concentrating, remembering, or making decisions?: Yes  Permission Sought/Granted                  Emotional Assessment              Admission diagnosis:  Sepsis (HCC) [A41.9] Patient Active Problem List   Diagnosis Date Noted   Sepsis (HCC) 09/08/2023   Stimulant use disorder 02/09/2023    Depression 02/09/2023   Pain and swelling of right wrist 09/30/2022   Hypotension 08/07/2022   MRSA infection    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 (LVAD) (HCC)    Left ventricular assist device (LVAD) complication, subsequent encounter 05/13/2022   AKI (acute kidney injury) (HCC) 05/13/2022   Hyperthyroidism 01/22/2022   Chronic systolic heart failure (HCC)    COPD (chronic obstructive pulmonary disease) (HCC) 09/09/2021   Malnutrition of moderate degree 11/17/2020   CHF (congestive heart failure), NYHA class IV, chronic, systolic (HCC) 11/17/2020   Palliative care by specialist    Goals of care, counseling/discussion    Hyperlipidemia    CHF exacerbation (HCC) 09/23/2020   Unstable angina (HCC) 09/22/2020   LVAD (left ventricular assist device) present (HCC)    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   Cardiogenic shock (HCC)    Acute respiratory failure (HCC)    RUQ abdominal pain    Cardiomyopathy (HCC) 11/21/2019   Acute systolic CHF (congestive heart failure) (HCC) 11/21/2019   History of alcohol abuse Quit  6 mos ago 11/21/2019   History of recreational drug use 11/21/2019   PAF (paroxysmal atrial fibrillation) (HCC) 11/21/2019   Cardiac volume overload 11/21/2019   Elevated brain natriuretic peptide (BNP) level 11/21/2019   Leg edema, right 11/21/2019   Hyponatremia 11/21/2019   Abnormal electrocardiogram (ECG) (EKG) 11/21/2019   Hyperglycemia 11/21/2019   Tremulousness 11/21/2019   Restlessness and agitation 11/21/2019   OSA (obstructive sleep apnea) 11/21/2019   Atrial fibrillation with RVR (HCC) 11/20/2019   Acute congestive heart failure (HCC) 11/20/2019   Acute respiratory distress 11/20/2019   COPD with acute exacerbation (HCC)    Hypertension    Type 2 diabetes mellitus (HCC)    Closed fracture of 5th metacarpal 08/31/2012   PCP:  [name], FNP Pharmacy:   [facility] [phone] - [place], [place] - [address] AT [facility] [address]  [address] [place] [address] Phone: [phone] Fax: [phone]     Social Determinants of Health (SDOH) Social History: SDOH Screenings   Food Insecurity: No Food Insecurity (09/08/2023)  Housing: Low Risk  (09/08/2023)  Transportation Needs: Unmet Transportation Needs (09/08/2023)  Utilities: Not At Risk (09/08/2023)  Alcohol Screen: Low Risk  (11/10/2020)  Depression (PHQ2-9): Low Risk  (10/16/2022)  Financial Resource Strain: Medium Risk (05/17/2022)  Physical Activity: Inactive (11/10/2020)  Tobacco Use: High Risk (09/08/2023)   SDOH Interventions:     Readmission Risk Interventions    07/01/2022    9:05 AM  Readmission Risk Prevention Plan  Transportation Screening Complete  Medication Review (RN Care Manager) Complete  PCP or Specialist appointment within 3-5 days of discharge Complete  HRI or Home Care Consult Complete  SW Recovery Care/Counseling Consult Complete  Palliative Care Screening Not Applicable  Skilled Nursing Facility Not Applicable

## 2023-09-09 NOTE — Inpatient Diabetes Management (Signed)
Inpatient Diabetes Program Recommendations  AACE/ADA: New Consensus Statement on Inpatient Glycemic Control   Target Ranges:  Prepandial:   less than 140 mg/dL      Peak postprandial:   less than 180 mg/dL (1-2 hours)      Critically ill patients:  140 - 180 mg/dL    Latest Reference Range & Units 09/09/23 05:51 09/09/23 12:13  Glucose-Capillary 70 - 99 mg/dL 161 (H) 096 (H)   Review of Glycemic Control  Diabetes history: DM2 Outpatient Diabetes medications: Glipizide 5 mg daily, Metformin 1000 mg BID Current orders for Inpatient glycemic control: Novolog 0-9 units TID with meals, Novolog 0-5 units QHS  Inpatient Diabetes Program Recommendations:    Insulin: If CBGs are consistently over 180 mg/dl, please consider ordering Semglee 8 units Q24H.  Thanks, Orlando Penner, RN, MSN, CDCES Diabetes Coordinator Inpatient Diabetes Program [phone] (Team Pager from 8am to 5pm)

## 2023-09-09 NOTE — Consult Note (Signed)
[address]            [place] [address]          [phone]       [name] [facility] Medical Record #[id number] Date of Birth: July 15, 1961  No ref. provider found Dr Kriste Basque, Lollie Marrow, FNP  Chief Complaint:    Chief Complaint  Patient presents with   Dizziness  Patient examined, images of admission CT chest and abdomen personally reviewed.  VAD power cord exit site wound personally examined and repacked after deep  culture obtained.  He has been started on IV Maxipime and daptomycin.  History of Present Illness:     62 year old diabetic male with ischemic cardiomyopathy status post HeartMate 3 implantation 2021.  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  He was recently hospitalized July 2024 for surgical debridements and IV antibiotics and was discharged home on oral minocycline.  He was admitted currently with dizziness and weakness with dehydration elevated white count of 17K, elevated blood sugar 260 and fever.  Chest x-ray was clear.  CT scan of chest showed some indurated tissue at the exit site of the power cord without discrete fluid collection.  At home he had been packing the exit site with Vashe wet-to-dry dressings daily.  The patient's VAD performance parameters are satisfactory INR is therapeutic at 2.4.  Current Activity/ Functional Status: Sedentary lifestyle due to deconditioning and history of ischemic cardiomyopathy   Past Medical History:  Diagnosis Date   "    Arthritis    CAD (coronary artery disease)    a. s/p CABG in 11/2019 with LIMA-LAD, SVG-OM1, SVG-PDA and SVG-D1   CHF (congestive heart failure) (HCC)    a. EF < 20% by echo in 11/2019   Essential hypertension    PAF (paroxysmal atrial fibrillation) (HCC)    Type 2  diabetes mellitus (HCC)     Past Surgical History:  Procedure Laterality Date   APPLICATION OF WOUND VAC Left 06/14/2022   Procedure: APPLICATION OF WOUND VAC;  Surgeon: Lovett Sox, MD;  [facility];  Service: Thoracic;  Laterality: Left;   APPLICATION OF WOUND VAC N/A 06/20/2022   Procedure: VAD POWERCORD TUNNEL WOUND VAC CHANGE;  Surgeon: Lovett Sox, MD;  [facility];  Service: Thoracic;  Laterality: N/A;   APPLICATION OF WOUND VAC N/A 06/27/2022   Procedure: WOUND VAC CHANGE;  Surgeon: Lovett Sox, MD;  [facility];  Service: Thoracic;  Laterality: N/A;   APPLICATION OF WOUND VAC N/A 07/08/2022   Procedure: WOUND VAC CHANGE;  Surgeon: Lovett Sox, MD;  [facility];  Service: Thoracic;  Laterality: N/A;   APPLICATION OF WOUND VAC N/A 01/28/2023   Procedure: APPLICATION OF WOUND VAC;  Surgeon: Lovett Sox, MD;  [facility];  Service: Thoracic;  Laterality: N/A;   APPLICATION OF WOUND VAC N/A 02/05/2023   Procedure: WOUND VAC CHANGE;  Surgeon: Lovett Sox, MD;  [facility];  Service: Thoracic;  Laterality: N/A;   APPLICATION OF WOUND VAC N/A 02/12/2023   Procedure: REMOVAL OF WOUND VAC DRESSING;  Surgeon: Lovett Sox, MD;  [facility];  Service: Thoracic;  Laterality: N/A;   APPLICATION OF WOUND VAC N/A 07/09/2023   Procedure: APPLICATION OF  WOUND VAC;  Surgeon: Lovett Sox, MD;  [facility];  Service: Thoracic;  Laterality: N/A;   BACK SURGERY     CLIPPING OF ATRIAL APPENDAGE N/A 11/26/2019   Procedure: Clipping Of Atrial Appendage using AtriCure 40 Clip;  Surgeon: [name], MD;  [facility];  Service: Open Heart Surgery;  Laterality: N/A;   CORONARY ARTERY BYPASS GRAFT N/A 11/26/2019   Procedure: CORONARY ARTERY BYPASS GRAFTING (CABG) using endoscopic greater saphenous vein harvest: svc to OM; svc to Diag; svc to PD; and LIMA to LAD.;  Surgeon: [name], MD;  [facility];  Service: Open Heart Surgery;   Laterality: N/A;   FOOT SURGERY     HAND SURGERY     INSERTION OF IMPLANTABLE LEFT VENTRICULAR ASSIST DEVICE N/A 11/17/2020   Procedure: INSERTION OF IMPLANTABLE LEFT VENTRICULAR ASSIST DEVICE - HM3;  Surgeon: [name], MD;  [facility];  Service: Open Heart Surgery;  Laterality: N/A;   INTRAOPERATIVE TRANSESOPHAGEAL ECHOCARDIOGRAM  12/04/2019   Procedure: Intraoperative Transesophageal Echocardiogram;  Surgeon: [name], MD;  [facility];  Service: Open Heart Surgery;;   MAZE N/A 11/26/2019   Procedure: MAZE using Bilateral Pulmonary Vein isolation.;  Surgeon: [name], MD;  [facility];  Service: Open Heart Surgery;  Laterality: N/A;   MULTIPLE EXTRACTIONS WITH ALVEOLOPLASTY N/A 11/16/2020   Procedure: MULTIPLE EXTRACTION WITH ALVEOLOPLASTY;  Surgeon: Sharman Cheek, DMD;  [facility];  Service: Dentistry;  Laterality: N/A;   PLACEMENT OF IMPELLA LEFT VENTRICULAR ASSIST DEVICE N/A 11/24/2019   Procedure: PLACEMENT OF IMPELLA 5.5 LEFT VENTRICULAR ASSIST DEVICE;  Surgeon: [name], MD;  [facility];  Service: Open Heart Surgery;  Laterality: N/A;   PLACEMENT OF IMPELLA LEFT VENTRICULAR ASSIST DEVICE Left 11/10/2020   Procedure: PLACEMENT OF IMPELLA 5.5 LEFT VENTRICULAR ASSIST DEVICE VIA  LEFT AVILLARY ARTERY;  Surgeon: [name], MD;  [facility];  Service: Open Heart Surgery;  Laterality: Left;   REMOVAL OF IMPELLA LEFT VENTRICULAR ASSIST DEVICE Right 12/04/2019   Procedure: REMOVAL OF IMPELLA LEFT VENTRICULAR ASSIST DEVICE, right axilla;  Surgeon: [name], MD;  Location: [facility] OR;  Service: Open Heart Surgery;  Laterality: Right;   RIGHT/LEFT HEART CATH AND CORONARY ANGIOGRAPHY N/A 11/24/2019   Procedure: RIGHT/LEFT HEART CATH AND CORONARY ANGIOGRAPHY;  Surgeon: Laurey Morale, MD;  Location: [facility] INVASIVE CV LAB;  Service: Cardiovascular;  Laterality: N/A;   RIGHT/LEFT HEART CATH AND CORONARY/GRAFT ANGIOGRAPHY N/A  11/09/2020   Procedure: RIGHT/LEFT HEART CATH AND CORONARY/GRAFT ANGIOGRAPHY;  Surgeon: [name], MD;  [facility];  Service: Cardiovascular;  Laterality: N/A;   STERNAL WOUND DEBRIDEMENT N/A 05/16/2022   Procedure: VAD DRIVELINE DEBRIDEMENT;  Surgeon: Lovett Sox, MD;  [facility];  Service: Thoracic;  Laterality: N/A;  VANC PULSE LAVAGE   STERNAL WOUND DEBRIDEMENT Left 06/14/2022   Procedure: DEBRIDEMENT OF VAD POWERCORD TUNNEL;  Surgeon: Lovett Sox, MD;  [facility];  Service: Thoracic;  Laterality: Left;   STERNAL WOUND DEBRIDEMENT N/A 06/27/2022   Procedure: ABDOMINAL WOUND DEBRIDEMENT, APPLICATION OF MYRIAD;  Surgeon: Lovett Sox, MD;  [facility];  Service: Thoracic;  Laterality: N/A;   STERNAL WOUND DEBRIDEMENT N/A 07/08/2022   Procedure: ABDOMINAL WOUND DEBRIDEMENT;  Surgeon: Lovett Sox, MD;  [facility];  Service: Thoracic;  Laterality: N/A;   STERNAL WOUND DEBRIDEMENT N/A 01/28/2023   Procedure: ABDOMINAL DRIVELINE WOUND DEBRIDEMENT;  Surgeon: Lovett Sox, MD;  [facility];  Service: Thoracic;  Laterality: N/A;   STERNAL WOUND DEBRIDEMENT N/A 02/05/2023   Procedure: ABDOMINAL DRIVELINE WOUND DEBRIDEMENT;  Surgeon: Lovett Sox, MD;  [facility];  Service: Thoracic;  Laterality: N/A;   STERNAL WOUND DEBRIDEMENT N/A 02/12/2023   Procedure: ABDOMINAL DRIVELINE WOUND DEBRIDEMENT AND APPLICATION OF MATRIX WOUND DRESSING;  Surgeon: Lovett Sox, MD;  [facility];  Service: Thoracic;  Laterality: N/A;   STERNAL WOUND DEBRIDEMENT N/A 07/09/2023   Procedure: DRIVELINE WOUND DEBRIDEMENT;  Surgeon: Lovett Sox, MD;  [facility];  Service: Thoracic;  Laterality: N/A;   STERNAL WOUND DEBRIDEMENT N/A 07/17/2023   Procedure: DRIVELINE WOUND DEBRIDEMENT;  Surgeon: Lovett Sox, MD;  [facility];  Service: Thoracic;  Laterality: N/A;   TEE WITHOUT CARDIOVERSION N/A 11/24/2019   Procedure: TRANSESOPHAGEAL ECHOCARDIOGRAM  (TEE);  Surgeon: [name], MD;  Location: [facility] OR;  Service: Open Heart Surgery;  Laterality: N/A;   TEE WITHOUT CARDIOVERSION N/A 11/26/2019   Procedure: TRANSESOPHAGEAL ECHOCARDIOGRAM (TEE);  Surgeon: [name], MD;  Location: [facility] OR;  Service: Open Heart Surgery;  Laterality: N/A;   TEE WITHOUT CARDIOVERSION N/A 11/10/2020   Procedure: TRANSESOPHAGEAL ECHOCARDIOGRAM (TEE);  Surgeon: [name], MD;  Location: [facility] OR;  Service: Open Heart Surgery;  Laterality: N/A;   TEE WITHOUT CARDIOVERSION N/A 11/17/2020   Procedure: TRANSESOPHAGEAL ECHOCARDIOGRAM (TEE);  Surgeon: [name], MD;  Location: [facility] OR;  Service: Open Heart Surgery;  Laterality: N/A;   WOUND EXPLORATION N/A 05/31/2022   Procedure: WOUND EXPLORATION, Debridement and Irrigation of abdominal wound;  Surgeon: Lovett Sox, MD;  [facility];  Service: Thoracic;  Laterality: N/A;    Social History   Tobacco Use  Smoking Status Some Days   Current packs/day: 0.00   Average packs/day: 1 pack/day for 30.0 years (30.0 ttl pk-yrs)   Types: Cigarettes   Start date: 12/19/1989   Last attempt to quit: 12/20/2019   Years since quitting: 3.7  Smokeless Tobacco Never    Social History   Substance and Sexual Activity  Alcohol Use Not Currently   Comment: Prior history of excessive intake    Social History   Socioeconomic History   Marital status: Married    Spouse name: Not on file   Number of children: Not on file   Years of education: 12   Highest education level: Not on file  Occupational History   Not on file  Tobacco Use   Smoking status: Some Days    Current packs/day: 0.00    Average packs/day: 1 pack/day for 30.0 years (30.0 ttl pk-yrs)    Types: Cigarettes    Start date: 12/19/1989    Last attempt to quit: 12/20/2019    Years since quitting: 3.7   Smokeless tobacco: Never  Vaping Use   Vaping status: Never Used  Substance and Sexual Activity   Alcohol use: Not Currently     Comment: Prior history of excessive intake   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 He denies any issues with walking or talking as a child. He completed the 9th grade. He is currently financially supported through disability. He is married and he has four children he is estranged from. He is currently living with his wife.    Social Determinants of Health   Financial  Resource Strain: Medium Risk (05/17/2022)   Overall Financial Resource Strain (CARDIA)    Difficulty of Paying Living Expenses: Somewhat hard  Food Insecurity: No Food Insecurity (09/08/2023)   Hunger Vital Sign    Worried About Running Out of Food in the Last Year: Never true    Ran Out of Food in the Last Year: Never true  Transportation Needs: Unmet Transportation Needs (09/08/2023)   PRAPARE - Administrator, Civil Service (Medical): Yes    Lack of Transportation (Non-Medical): Yes  Physical Activity: Inactive (11/10/2020)   Exercise Vital Sign    Days of Exercise per Week: 0 days    Minutes of Exercise per Session: 0 min  Stress: Not on file  Social Connections: Not on file  Intimate Partner Violence: Not At Risk (09/08/2023)   Humiliation, Afraid, Rape, and Kick questionnaire    Fear of Current or Ex-Partner: No    Emotionally Abused: No    Physically Abused: No    Sexually Abused: No    Allergies  Allergen Reactions   Firvanq [Vancomycin] Nausea And Vomiting and Other (See Comments)    Arthralgias     Current Facility-Administered Medications  Medication Dose Route Frequency Provider Last Rate Last Admin   acetaminophen (TYLENOL) tablet 650 mg  650 mg Oral Q4H PRN Andrey Farmer, PA-C   650 mg at 09/09/23 0451   albuterol (PROVENTIL) (2.5 MG/3ML) 0.083% nebulizer solution 2.5 mg  2.5 mg Nebulization Q6H PRN Andrey Farmer, PA-C        ceFEPIme (MAXIPIME) 2 g in sodium chloride 0.9 % 100 mL IVPB  2 g Intravenous Q8H Sabharwal, Aditya, DO 200 mL/hr at 09/09/23 0453 Infusion Verify at 09/09/23 0453   DAPTOmycin (CUBICIN) 800 mg in sodium chloride 0.9 % IVPB  800 mg Intravenous Q1400 Sabharwal, Aditya, DO   Stopped at 09/08/23 2256   gabapentin (NEURONTIN) capsule 300 mg  300 mg Oral TID Andrey Farmer, PA-C   300 mg at 09/09/23 4742   insulin aspart (novoLOG) injection 0-5 Units  0-5 Units Subcutaneous QHS Sabharwal, Aditya, DO       insulin aspart (novoLOG) injection 0-9 Units  0-9 Units Subcutaneous TID WC Sabharwal, Aditya, DO       ipratropium-albuterol (DUONEB) 0.5-2.5 (3) MG/3ML nebulizer solution 3 mL  3 mL Inhalation Q6H Sabharwal, Aditya, DO   3 mL at 09/09/23 0905   metoprolol succinate (TOPROL-XL) 24 hr tablet 12.5 mg  12.5 mg Oral Daily Clegg, Amy D, NP   12.5 mg at 09/09/23 0906   minocycline (MINOCIN) capsule 200 mg  200 mg Oral BID Andrey Farmer, PA-C   200 mg at 09/08/23 2210   mirtazapine (REMERON) tablet 15 mg  15 mg Oral QHS Andrey Farmer, PA-C   15 mg at 09/08/23 2211   ondansetron (ZOFRAN) injection 4 mg  4 mg Intravenous Q6H PRN Andrey Farmer, PA-C       pantoprazole (PROTONIX) EC tablet 40 mg  40 mg Oral Daily Andrey Farmer, [place]   40 mg at 09/09/23 5956   rosuvastatin (CRESTOR) tablet 20 mg  20 mg Oral Daily Andrey Farmer, PA-C   20 mg at 09/09/23 3875   sertraline (ZOLOFT) tablet 50 mg  50 mg Oral Daily Andrey Farmer, PA-C       tamsulosin [facility]) capsule 0.4 mg  0.4 mg Oral Daily Andrey Farmer, PA-C   0.4 mg at 09/09/23 6433   traMADol (ULTRAM) tablet 100  mg  100 mg Oral Q12H PRN Andrey Farmer, PA-C   100 mg at 09/08/23 2211   Warfarin - Pharmacist Dosing Inpatient   Does not apply q1600 Sabharwal, Aditya, DO         Family History  Problem Relation Age of Onset   Heart disease Sister        Tumor?   Arthritis Other    Lung  disease Other    Asthma Other    Diabetes Other    Thyroid disease Neg Hx      Review of Systems:     Cardiac Review of Systems: Y or N  Chest Pain [    ]  Resting SOB [   ] Exertional SOB  [ y ]  Orthopnea [  ]   Pedal Edema [   ]    Palpitations [  ] Syncope  [  ]   Presyncope [ y  ]  General Review of Systems: [Y] = yes [  ]=no Constitional: recent weight change [  ]; anorexia [  ]; fatigue [  y]; nausea [  ]; night sweats [  ]; fever [  ]; or chills [  ];                                                                                                                                          Dental: poor dentition[  ]; Last Dentist visit: 1 year  Eye : blurred vision [  ]; diplopia [   ]; vision changes [  ];  Amaurosis fugax[  ]; Resp: cough [  ];  wheezing[  ];  hemoptysis[  ]; shortness of breath[ y ]; paroxysmal nocturnal dyspnea[  ]; dyspnea on exertion[  ]; or orthopnea[  ];  GI:  gallstones[  ], vomiting[  ];  dysphagia[  ]; melena[  ];  hematochezia [  ]; heartburn[  ];   Hx of  Colonoscopy[  ]; GU: kidney stones [  ]; hematuria[  ];   dysuria [  ];  nocturia[  ];  history of     obstruction [  ];                 Skin: rash, swelling[  ];, hair loss[  ];  peripheral edema[  ];  or itching[  ]; Musculosketetal: myalgias[  y];  joint swelling[  ];  joint erythema[  ];  joint pain[  ];  back pain[  ];  Heme/Lymph: bruising[  ];  bleeding[  ];  anemia[  ];  Neuro: TIA[  ];  headaches[  ];  stroke[  ];  vertigo[  ];  seizures[  ];   paresthesias[  ];  difficulty walking[  ];  Psych:depression[  ]; anxiety[  ];  Endocrine: diabetes[ y ];  thyroid dysfunction[  ];  Immunizations: Flu [  ]; Pneumococcal[  ];  Other:  Physical Exam: BP 104/63 (BP Location: Right Arm)   Pulse 93   Temp 99.1 F (37.3 C) (Oral)   Resp (!) 23   Ht 5\' 10"  (1.778 m)   Wt 88 kg   SpO2 97%   BMI 27.84 kg/m        Exam    General- alert and oriented but appears to be with sinus congestion, cough,  and malaise    Neck- no JVD, no cervical adenopathy palpable, no carotid bruit   Lungs- clear without rales, wheezes   Cor- regular rate and rhythm, normal VAD hum   Abdomen- soft, non-tender, diastases rectus   Extremities - warm, non-tender, minimal edema   Neuro- oriented, appropriate, no focal weakness    Diagnostic Studies & Laboratory data:     Recent Radiology Findings:   CT CHEST ABDOMEN PELVIS W CONTRAST  Result Date: 09/08/2023 CLINICAL DATA:  Sepsis.  Concern for LVAD driveline infection. EXAM: CT CHEST, ABDOMEN, AND PELVIS WITH CONTRAST TECHNIQUE: Multidetector CT imaging of the chest, abdomen and pelvis was performed following the standard protocol during bolus administration of intravenous contrast. RADIATION DOSE REDUCTION: This exam was performed according to the departmental dose-optimization program which includes automated exposure control, adjustment of the mA and/or kV according to patient size and/or use of iterative reconstruction technique. CONTRAST:  75mL OMNIPAQUE IOHEXOL 350 MG/ML SOLN COMPARISON:  CT of the chest abdomen pelvis dated 07/07/2023. FINDINGS: CT CHEST FINDINGS Cardiovascular: There is no cardiomegaly or pericardial effusion. Advanced 3 vessel coronary vascular calcification and left atrial appendage occlusive device. Left ventricular assist device with associated streak artifact. Similar appearance of circumferential thickening of the soft tissues surrounding the drive line which may be chronic. Recurrent infection is not excluded. No fluid collection. There is moderate atherosclerotic calcification of the thoracic aorta. No aneurysmal dilatation or dissection. The origins of the great vessels of the aortic arch and the central pulmonary arteries appear patent as visualized. Mediastinum/Nodes: No hilar or mediastinal adenopathy. The esophagus and the thyroid gland are grossly unremarkable. No mediastinal fluid collection. Lungs/Pleura: Background of  centrilobular emphysema. No focal consolidation, pleural effusion, or pneumothorax. Bibasilar linear atelectasis/scarring. The central airways are patent. Musculoskeletal: Median sternotomy wires. Degenerative changes of the spine. No acute osseous pathology. CT ABDOMEN PELVIS FINDINGS No intra-abdominal free air or free fluid. Hepatobiliary: Mild fatty infiltration of the liver. No biliary dilatation. The gallbladder is unremarkable. Pancreas: Unremarkable. No pancreatic ductal dilatation or surrounding inflammatory changes. Spleen: Normal in size without focal abnormality. Adrenals/Urinary Tract: The adrenal glands unremarkable. Vascular calcification versus a 2 mm nonobstructing left renal upper pole calculus. There is no hydronephrosis on either side. There is symmetric enhancement and excretion of contrast by both kidneys. The visualized ureters and urinary bladder appear unremarkable. Stomach/Bowel: Moderate stool throughout the colon. There is no bowel obstruction or active inflammation. The appendix is normal. Vascular/Lymphatic: Advanced aortoiliac atherosclerotic disease. The IVC is unremarkable. No portal venous gas. There is no adenopathy. Reproductive: The prostate and seminal vesicles are grossly unremarkable. No pelvic mass. Other: Similar appearance of laxity of the anterior abdominal wall with a broad-based hernia containing loops of small and large bowel without evidence of obstruction. Musculoskeletal: Osteopenia with degenerative changes of the spine. Lower lumbar fusion. No acute osseous pathology. IMPRESSION: 1. Similar appearance of circumferential thickening of the soft tissues surrounding the drive line which may be chronic. Recurrent infection is not excluded. No fluid collection. 2. No acute intrathoracic, abdominal, or pelvic pathology. 3. Aortic Atherosclerosis (  ICD10-I70.0) and Emphysema (ICD10-J43.9). Electronically Signed   By: Elgie Collard M.D.   On: 09/08/2023 23:10   DG Chest  2 View  Result Date: 09/08/2023 CLINICAL DATA:  sob  Pt c/o dizziness and SOB started at 1100 today. EXAM: CHEST - 2 VIEW COMPARISON:  CT chest 07/07/2023 FINDINGS: LVAD overlies the left lower hemithorax.  Atrial appendage clip. The heart and mediastinal contours are unchanged. Atherosclerotic plaque. Left base atelectasis no focal consolidation. No pulmonary edema. No pleural effusion. No pneumothorax. No acute osseous abnormality. Intact sternotomy wires. Bilateral axillary region vascular clips. IMPRESSION: 1. No active cardiopulmonary disease. 2.  Aortic Atherosclerosis (ICD10-I70.0). Electronically Signed   By: Tish Frederickson M.D.   On: 09/08/2023 20:17      Recent Lab Findings: Lab Results  Component Value Date   WBC 14.9 (H) 09/09/2023   HGB 11.3 (L) 09/09/2023   HCT 34.2 (L) 09/09/2023   PLT 188 09/09/2023   GLUCOSE 262 (H) 09/09/2023   CHOL 175 07/09/2023   TRIG 193 (H) 07/09/2023   HDL 45 07/09/2023   LDLCALC 91 07/09/2023   ALT 16 09/08/2023   AST 17 09/08/2023   NA 131 (L) 09/09/2023   K 3.8 09/09/2023   CL 98 09/09/2023   CREATININE 1.29 (H) 09/09/2023   BUN 17 09/09/2023   CO2 24 09/09/2023   TSH 7.452 (H) 07/07/2023   INR 2.5 (H) 09/09/2023   HGBA1C 6.3 (H) 07/09/2023      Assessment / Plan:   Patient admitted for malaise, weakness, shortness of breath from probable viral syndrome.  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  History of ischemic cardiomyopathy and HeartMate 3 implantation 2021  Agree with plans for IV antibiotics and daily wound dressing change with Vashe wet-to-dry gauze.  Cultures are pending.  Patient does not meet criteria for repeat surgical debridement due to the close proximity of the power cord tunnel to the outflow graft anteriorly and the peritoneal cavity posteriorly.   Continue daily dressing changes and IV antibiotics.

## 2023-09-10 ENCOUNTER — Other Ambulatory Visit (HOSPITAL_COMMUNITY): Payer: Self-pay

## 2023-09-10 ENCOUNTER — Other Ambulatory Visit (HOSPITAL_COMMUNITY): Payer: 59

## 2023-09-10 DIAGNOSIS — Z95811 Presence of heart assist device: Secondary | ICD-10-CM | POA: Diagnosis not present

## 2023-09-10 DIAGNOSIS — A419 Sepsis, unspecified organism: Secondary | ICD-10-CM | POA: Diagnosis not present

## 2023-09-10 DIAGNOSIS — T827XXA Infection and inflammatory reaction due to other cardiac and vascular devices, implants and grafts, initial encounter: Secondary | ICD-10-CM | POA: Diagnosis not present

## 2023-09-10 LAB — CBC
HCT: 32.4 % — ABNORMAL LOW (ref 39.0–52.0)
Hemoglobin: 10.5 g/dL — ABNORMAL LOW (ref 13.0–17.0)
MCH: 25.9 pg — ABNORMAL LOW (ref 26.0–34.0)
MCHC: 32.4 g/dL (ref 30.0–36.0)
MCV: 80 fL (ref 80.0–100.0)
Platelets: 187 10*3/uL (ref 150–400)
RBC: 4.05 MIL/uL — ABNORMAL LOW (ref 4.22–5.81)
RDW: 14.1 % (ref 11.5–15.5)
WBC: 13.3 10*3/uL — ABNORMAL HIGH (ref 4.0–10.5)
nRBC: 0 % (ref 0.0–0.2)

## 2023-09-10 LAB — BASIC METABOLIC PANEL
Anion gap: 11 (ref 5–15)
BUN: 20 mg/dL (ref 8–23)
CO2: 22 mmol/L (ref 22–32)
Calcium: 8.3 mg/dL — ABNORMAL LOW (ref 8.9–10.3)
Chloride: 100 mmol/L (ref 98–111)
Creatinine, Ser: 1.32 mg/dL — ABNORMAL HIGH (ref 0.61–1.24)
GFR, Estimated: >60 mL/min (ref >60)
Glucose, Bld: 249 mg/dL — ABNORMAL HIGH (ref 70–99)
Potassium: 4 mmol/L (ref 3.5–5.1)
Sodium: 133 mmol/L — ABNORMAL LOW (ref 135–145)

## 2023-09-10 LAB — PROTIME-INR
INR: 2.5 — ABNORMAL HIGH (ref 0.8–1.2)
Prothrombin Time: 26.8 s — ABNORMAL HIGH (ref 11.4–15.2)

## 2023-09-10 LAB — GLUCOSE, CAPILLARY: Glucose-Capillary: 264 mg/dL — ABNORMAL HIGH (ref 70–99)

## 2023-09-10 LAB — LACTATE DEHYDROGENASE: LDH: 129 U/L (ref 98–192)

## 2023-09-10 MED ORDER — INSULIN ASPART 100 UNIT/ML IJ SOLN: 0.0000 [IU] | Freq: Three times a day (TID) | INTRAMUSCULAR | Status: DC

## 2023-09-10 MED ORDER — CIPROFLOXACIN HCL 750 MG PO TABS
750.0000 mg | ORAL_TABLET | Freq: Two times a day (BID) | ORAL | 3 refills | Status: DC
  Filled 2023-09-10: qty 60, 30d supply, fill #0

## 2023-09-10 MED ORDER — DAPTOMYCIN-SODIUM CHLORIDE 700-0.9 MG/100ML-% IV SOLN
700.0000 mg | Freq: Every day | INTRAVENOUS | Status: DC
  Filled 2023-09-10: qty 100

## 2023-09-10 MED ORDER — INSULIN GLARGINE-YFGN 100 UNIT/ML ~~LOC~~ SOLN
10.0000 [IU] | Freq: Every day | SUBCUTANEOUS | Status: DC
  Administered 2023-09-10: 10 [IU] via SUBCUTANEOUS
  Filled 2023-09-10: qty 0.1

## 2023-09-10 MED ORDER — MINOCYCLINE HCL 100 MG PO CAPS
200.0000 mg | ORAL_CAPSULE | Freq: Two times a day (BID) | ORAL | 3 refills | Status: DC
  Filled 2023-09-10: qty 120, 30d supply, fill #0

## 2023-09-10 MED ORDER — WARFARIN SODIUM 3 MG PO TABS: 6.0000 mg | ORAL_TABLET | Freq: Once | ORAL | Status: DC

## 2023-09-10 MED ORDER — METOPROLOL SUCCINATE ER 25 MG PO TB24
25.0000 mg | ORAL_TABLET | Freq: Every day | ORAL | Status: DC
  Administered 2023-09-10: 25 mg via ORAL
  Filled 2023-09-10: qty 1

## 2023-09-10 MED ORDER — INSULIN ASPART 100 UNIT/ML IJ SOLN: 0.0000 [IU] | Freq: Every day | INTRAMUSCULAR | Status: DC

## 2023-09-10 MED ORDER — SODIUM CHLORIDE 0.9 % IV SOLN: 8.0000 mg/kg | Freq: Every day | INTRAVENOUS | Status: DC

## 2023-09-10 NOTE — Progress Notes (Addendum)
ANTICOAGULATION CONSULT NOTE  Pharmacy Consult for Warfarin Indication:  VAD  Allergies  Allergen Reactions   Firvanq [Vancomycin] Nausea And Vomiting and Other (See Comments)    Arthralgias     Patient Measurements: Height: 5\' 10"  (177.8 cm) Weight: 88.7 kg (195 lb 8.8 oz) IBW/kg (Calculated) : 73  Vital Signs: Temp: 98.6 F (37 C) (10/02 0802) Temp Source: Oral (10/02 0802) BP: 104/87 (10/02 0802)  Labs: Recent Labs    09/08/23 1940 09/08/23 2105 09/08/23 2120 09/09/23 0721 09/10/23 0236 09/10/23 0758  HGB 12.2*  --  11.6* 11.3* 10.5*  --   HCT 37.5*  --  34.8* 34.2* 32.4*  --   PLT 230  --  207 188 187  --   LABPROT 20.7*  --   --  27.0*  --  26.8*  INR 1.8*  --   --  2.5*  --  2.5*  CREATININE 1.23 1.27*  --  1.29* 1.32*  --   TROPONINIHS 6 5  --   --   --   --     Estimated Creatinine Clearance: 65.1 mL/min (A) (by C-G formula based on SCr of 1.32 mg/dL (H)).   Medical History: Past Medical History:  Diagnosis Date   "    Arthritis    CAD (coronary artery disease)    a. s/p CABG in 11/2019 with LIMA-LAD, SVG-OM1, SVG-PDA and SVG-D1   CHF (congestive heart failure) (HCC)    a. EF < 20% by echo in 11/2019   Essential hypertension    PAF (paroxysmal atrial fibrillation) (HCC)    Type 2 diabetes mellitus (HCC)     Medications:  Medications Prior to Admission  Medication Sig Dispense Refill Last Dose   acetaminophen (TYLENOL) 325 MG tablet Take 2 tablets (650 mg total) by mouth every 4 (four) hours as needed for headache or mild pain.   Past Week   albuterol (PROVENTIL) (2.5 MG/3ML) 0.083% nebulizer solution INHALE 3 ML BY NEBULIZATION EVERY 6 HOURS AS NEEDED FOR WHEEZING OR SHORTNESS OF BREATH (Patient taking differently: Take 2.5 mg by nebulization every 6 (six) hours as needed for shortness of breath.) 450 mL 1 09/08/2023   albuterol (VENTOLIN HFA) 108 (90 Base) MCG/ACT inhaler Inhale 2 puffs into the lungs every 6 (six) hours as needed for wheezing or  shortness of breath.   09/08/2023   allopurinol (ZYLOPRIM) 100 MG tablet Take 1 tablet (100 mg total) by mouth daily. 90 tablet 3 09/08/2023   amLODipine (NORVASC) 10 MG tablet Take 1 tablet (10 mg total) by mouth daily. 30 tablet 5 09/08/2023   ciprofloxacin (CIPRO) 750 MG tablet Take 1 tablet (750 mg total) by mouth 2 (two) times daily. 28 tablet 0 09/08/2023   colchicine 0.6 MG tablet Take 1 tablet (0.6 mg total) by mouth daily as needed (gout pain). (Patient taking differently: Take 0.6 mg by mouth daily.)   09/08/2023   COMBIVENT RESPIMAT 20-100 MCG/ACT AERS respimat Inhale 1 puff into the lungs every 6 (six) hours.   09/08/2023   fluticasone (FLONASE ALLERGY RELIEF) 50 MCG/ACT nasal spray Place 2 sprays into both nostrils as needed for allergies or rhinitis.   Past Month   gabapentin (NEURONTIN) 300 MG capsule Take 1 capsule (300 mg total) by mouth 3 (three) times daily. 90 capsule 0 09/08/2023   glipiZIDE (GLUCOTROL) 5 MG tablet Take 1 tablet (5 mg total) by mouth daily. 30 tablet 11 09/08/2023   guaifenesin (HUMIBID E) 400 MG TABS tablet Take 400 mg  by mouth every 6 (six) hours as needed (for congestion).   09/08/2023   loratadine (CLARITIN) 10 MG tablet Take 1 tablet (10 mg total) by mouth daily. 30 tablet 6 09/08/2023   losartan (COZAAR) 50 MG tablet Take 1 tablet (50 mg total) by mouth daily. 30 tablet 5 09/08/2023   metFORMIN (GLUCOPHAGE) 1000 MG tablet Take 1 tablet (1,000 mg total) by mouth 2 (two) times daily. 120 tablet 6 09/08/2023   metoprolol succinate (TOPROL XL) 25 MG 24 hr tablet Take 1 tablet (25 mg total) by mouth daily. 30 tablet 5 09/08/2023   minocycline (MINOCIN) 100 MG capsule Take 2 capsules (200 mg total) by mouth 2 (two) times daily. 60 capsule 11 09/08/2023   mirtazapine (REMERON) 15 MG tablet Take 1 tablet (15 mg total) by mouth at bedtime.   09/07/2023   Multiple Vitamin (MULTIVITAMIN WITH MINERALS) TABS tablet Take 1 tablet by mouth daily.   09/08/2023   pantoprazole (PROTONIX)  40 MG tablet Take 1 tablet (40 mg total) by mouth daily. 90 tablet 2 09/08/2023   rosuvastatin (CRESTOR) 20 MG tablet Take 1 tablet (20 mg total) by mouth daily. 30 tablet 5 09/08/2023   spironolactone (ALDACTONE) 25 MG tablet Take 0.5 tablets (12.5 mg total) by mouth daily. 30 tablet 5 09/08/2023   tamsulosin (FLOMAX) 0.4 MG CAPS capsule Take 1 capsule (0.4 mg total) by mouth daily. 90 capsule 3 09/08/2023   thiamine (VITAMIN B-1) 100 MG tablet Take 1 tablet (100 mg total) by mouth daily.   09/08/2023   traMADol (ULTRAM) 50 MG tablet Take 2 tablets (100 mg total) by mouth every 12 (twelve) hours as needed. 120 tablet 0 09/08/2023   TRELEGY ELLIPTA 100-62.5-25 MCG/ACT AEPB Inhale 1 puff into the lungs daily. 1 each 6 09/08/2023   warfarin (COUMADIN) 4 MG tablet Take 1-1.5 tablets (4-6 mg total) by mouth See admin instructions. Take 1 tablet (4 mg) every Tuesday and Thursday, Take 1.5 tabs (6 mg) all other days 45 tablet 11 09/07/2023 at 1630   Oxycodone HCl 10 MG TABS Take 1 tablet (10 mg total) by mouth every 6 (six) hours as needed. (Patient not taking: Reported on 09/08/2023) 28 tablet 0 Not Taking   sertraline (ZOLOFT) 50 MG tablet Take 1 tablet (50 mg total) by mouth daily. (Patient not taking: Reported on 09/08/2023) 90 tablet 3 Not Taking   traZODone (DESYREL) 150 MG tablet Take 1 tablet (150 mg total) by mouth at bedtime. (Patient not taking: Reported on 08/26/2023) 90 tablet 3 Not Taking   Scheduled:   gabapentin  300 mg Oral TID   insulin aspart  0-15 Units Subcutaneous TID WC   insulin aspart  0-5 Units Subcutaneous QHS   insulin glargine-yfgn  10 Units Subcutaneous Daily   ipratropium-albuterol  3 mL Inhalation Q6H   metoprolol succinate  25 mg Oral Daily   minocycline  200 mg Oral BID   mirtazapine  15 mg Oral QHS   pantoprazole  40 mg Oral Daily   rosuvastatin  20 mg Oral Daily   sertraline  50 mg Oral Daily   tamsulosin  0.4 mg Oral Daily   Warfarin - Pharmacist Dosing Inpatient   Does  not apply q1600   Infusions:   ceFEPime (MAXIPIME) IV 2 g (09/10/23 0433)   daptomycin     PRN: acetaminophen, albuterol, ondansetron (ZOFRAN) IV, traMADol  Assessment: 62 yom with concern for driveline fracture and infection. Patient was on ciprofloxacin and minocycline as outpatient for driveline infection  following cefepime/daptomycin course. Warfarin per pharmacy consult placed for VAD indication.  Patient taking warfarin prior to arrival. Home dose is 4 mg T/Th and 6 mg AOD. Last taken 9/29 1630.  INR remains at 2.5. Hgb 10.5, plt 187. LDH 129. No s/sx of bleeding. Oral intake documented at 100% yesterday.   Goal of Therapy:  INR Goal 2-2.5 Monitor platelets by anticoagulation protocol: Yes   Plan:  Warfarin 6 mg tonight (similar to PTA regimen) Monitor INR, CBC, and s/sx of bleeding  Will discharge on PTA regimen of warfarin  Thank you for allowing pharmacy to participate in this patient's care,  Sherron Monday, PharmD, BCCCP Clinical Pharmacist  Phone: [phone] 09/10/2023 9:05 AM  Please check AMION for all [facility] Pharmacy phone numbers After 10:00 PM, call Main Pharmacy [phone]

## 2023-09-10 NOTE — Progress Notes (Signed)
Went over AVS paper work with patient, answered all questions, removed iv, got dressed, picked up TOC meds on way out to wives' vehicle.

## 2023-09-10 NOTE — Progress Notes (Signed)
LVAD Coordinator Rounding Note:  Admitted 09/08/23 to Heart Failure service from [facility] with possible sepsis vs viral infection. Pt came to [facility] 09/08/23 complaining of shortness of breath with audible expiratory wheezing along with cough,dizziness and generalized fatigue.   Wife Kennith Center states pt has hardly gotten out of bed since discharge 08/15/23 and his little to no appetite. Pt and wife report compliance with all medications at home. Pt on Minocycline 200mg  BID and Cipro 750mg  BID for drive line infection. Pt no showed appointment with ID. Kennith Center has continued to change drive line dressing daily and reports improvements in wound bed.    Pt lying in bed this morning and states he feels much better. Symptoms felt to be viral source per rounding team. Resp panel negative. Pending discharge home today with follow up in VAD Clinic next Thursday. Plan to continue Minocycline 200mg  BID and Cipro 750 BID at discharge. ID arranged virtual f/u for pt.  Vital signs: Temp: 98.6 HR: 108 Doppler Pressure: 106 Automatic BP: 104/87 (95) O2 Sat: 96% on RA Wt: 194>195.5 lbs  LVAD interrogation reveals:  Speed: 5600 Flow: 4.5 Power: 4.4 w PI: 2.9 Alarms: none Events: 38 PI events today Hct: 31  Fixed speed:  5600 Low speed limit:  5300  Drive Line: Existing VAD dressing removed and site care performed using sterile technique by Dr. Maren Beach. Wound bed cleansed with VASHE solution and sterile 4x4 gauze through light manual debridement. Skin surrounding wound bed cleaned with Chlora prep applicators x 2, allowed to dry. VASHE moistened 1/2 inch Nugauze packed into wound bed. Followed by VASHE soaked 2x2 flush to exit site and then covered with several dry 4 x 4s. Wound tunnels approx 3 cm.  Drive line partially incorporated. Small amount of thick yellow/serosanguinous drainage noted. No redness, tenderness, foul odor or rash noted.  Drive line anchor reapplied. Continue daily wet to dry dressing changes using  VASHE solution per bedside RN or VAD coordinator. Next dressing change due 09/11/23. Pt felt safe for discharge per Dr. Maren Beach with close wound care follow up in VAD Clinic.     Pt had previous drive line debridement in OR with placement of instillation wound vac on 07/09/23 with Dr Donata Clay. This was changed to wet/dry on Friday 8/2. Pt unable to undergo further debridement due to risk of injury to the outflow graft as it is just above the drive line. ID consulted.  CT Chest/Abd/Pelvis 9/30 IMPRESSION: 1. Similar appearance of circumferential thickening of the soft tissues surrounding the drive line which may be chronic. Recurrent infection is not excluded. No fluid collection. 2. No acute intrathoracic, abdominal, or pelvic pathology. 3. Aortic Atherosclerosis (ICD10-I70.0) and Emphysema (ICD10-J43.9).  Labs:  LDH trend: 117>129  INR trend: 2.5>2.5  WBC trend: 17.4>14.9>13.3  Anticoagulation Plan: -INR Goal: 2.0 - 2.5 -ASA Dose: none  ICD: N/A  Infection:  09/08/23>>Blood Cultures>>pending 09/08/23>> Aerobic wound culture>>NGTD>>pending  Drips:    Plan/Recommendations:  Page VAD coordinator with equipment issues or driveline problems Daily drive line wet to dry dressing changes with VASHE solution.   Simmie Davies RN,BSN VAD Coordinator  Office: [phone]  24/7 Pager: [phone]

## 2023-09-10 NOTE — Progress Notes (Signed)
Advanced Heart Failure VAD Team Note  PCP-Cardiologist: Nona Dell, MD   Subjective:   Admitted with possible sepsis. Currently on  daptomycin, and cefepime.   Bld CX - NGTD x 2 days .  WBC down to 13.    Feeling better. Denies SOB.   LVAD INTERROGATION:  HeartMate III LVAD:   Flow 4.4  liters/min, speed 5600, power 4 PI 3.1     < 40 PI events   Objective:    Vital Signs:   Temp:  [98.3 F (36.8 C)-100.3 F (37.9 C)] 98.3 F (36.8 C) (10/02 0435) Pulse Rate:  [73-109] 109 (10/01 1938) Resp:  [15-27] 20 (10/02 0435) BP: (88-120)/(63-91) 106/83 (10/02 0435) SpO2:  [93 %-97 %] 94 % (10/02 0435) Weight:  [88.7 kg] 88.7 kg (10/02 0559) Last BM Date : 09/08/23 Mean arterial Pressure 80s   Intake/Output:   Intake/Output Summary (Last 24 hours) at 09/10/2023 0719 Last data filed at 09/10/2023 0500 Gross per 24 hour  Intake 1078.11 ml  Output 1500 ml  Net -421.89 ml     Physical Exam   Physical Exam: GENERAL: No acute distress. HEENT: normal  NECK: Supple, JVP flat .  2+ bilaterally, no bruits.  No lymphadenopathy or thyromegaly appreciated.   CARDIAC:  Mechanical heart sounds with LVAD hum present.  LUNGS:  Clear to auscultation bilaterally.  ABDOMEN:  + large hernia, soft, round, nontender, positive bowel sounds x4.     LVAD exit site:   Dressing dry and intact.  No erythema or drainage.  Stabilization device present and accurately applied.  EXTREMITIES:  Warm and dry, no cyanosis, clubbing, rash or edema  NEUROLOGIC:  Alert and oriented x 3.    No aphasia.  No dysarthria.  Affect pleasant.      Telemetry   SR   EKG    N/A   Labs   Basic Metabolic Panel: Recent Labs  Lab 09/08/23 1940 09/08/23 2105 09/09/23 0721 09/10/23 0236  NA 131* 132* 131* 133*  K 4.3 4.1 3.8 4.0  CL 95* 98 98 100  CO2 23 23 24 22   GLUCOSE 184* 184* 262* 249*  BUN 17 16 17 20   CREATININE 1.23 1.27* 1.29* 1.32*  CALCIUM 9.1 8.9 8.5* 8.3*    Liver Function  Tests: Recent Labs  Lab 09/08/23 2105  AST 17  ALT 16  ALKPHOS 110  BILITOT 0.5  PROT 7.7  ALBUMIN 3.4*   No results for input(s): "LIPASE", "AMYLASE" in the last 168 hours. No results for input(s): "AMMONIA" in the last 168 hours.  CBC: Recent Labs  Lab 09/08/23 1940 09/08/23 2120 09/09/23 0721 09/10/23 0236  WBC 17.5* 17.4* 14.9* 13.3*  NEUTROABS  --  14.0*  --   --   HGB 12.2* 11.6* 11.3* 10.5*  HCT 37.5* 34.8* 34.2* 32.4*  MCV 80.5 80.9 79.5* 80.0  PLT 230 207 188 187    INR: Recent Labs  Lab 09/08/23 1940 09/09/23 0721  INR 1.8* 2.5*    Other results: EKG:    Imaging   CT CHEST ABDOMEN PELVIS W CONTRAST  Result Date: 09/08/2023 CLINICAL DATA:  Sepsis.  Concern for LVAD driveline infection. EXAM: CT CHEST, ABDOMEN, AND PELVIS WITH CONTRAST TECHNIQUE: Multidetector CT imaging of the chest, abdomen and pelvis was performed following the standard protocol during bolus administration of intravenous contrast. RADIATION DOSE REDUCTION: This exam was performed according to the departmental dose-optimization program which includes automated exposure control, adjustment of the mA and/or kV according to patient size  and/or use of iterative reconstruction technique. CONTRAST:  75mL OMNIPAQUE IOHEXOL 350 MG/ML SOLN COMPARISON:  CT of the chest abdomen pelvis dated 07/07/2023. FINDINGS: CT CHEST FINDINGS Cardiovascular: There is no cardiomegaly or pericardial effusion. Advanced 3 vessel coronary vascular calcification and left atrial appendage occlusive device. Left ventricular assist device with associated streak artifact. Similar appearance of circumferential thickening of the soft tissues surrounding the drive line which may be chronic. Recurrent infection is not excluded. No fluid collection. There is moderate atherosclerotic calcification of the thoracic aorta. No aneurysmal dilatation or dissection. The origins of the great vessels of the aortic arch and the central  pulmonary arteries appear patent as visualized. Mediastinum/Nodes: No hilar or mediastinal adenopathy. The esophagus and the thyroid gland are grossly unremarkable. No mediastinal fluid collection. Lungs/Pleura: Background of centrilobular emphysema. No focal consolidation, pleural effusion, or pneumothorax. Bibasilar linear atelectasis/scarring. The central airways are patent. Musculoskeletal: Median sternotomy wires. Degenerative changes of the spine. No acute osseous pathology. CT ABDOMEN PELVIS FINDINGS No intra-abdominal free air or free fluid. Hepatobiliary: Mild fatty infiltration of the liver. No biliary dilatation. The gallbladder is unremarkable. Pancreas: Unremarkable. No pancreatic ductal dilatation or surrounding inflammatory changes. Spleen: Normal in size without focal abnormality. Adrenals/Urinary Tract: The adrenal glands unremarkable. Vascular calcification versus a 2 mm nonobstructing left renal upper pole calculus. There is no hydronephrosis on either side. There is symmetric enhancement and excretion of contrast by both kidneys. The visualized ureters and urinary bladder appear unremarkable. Stomach/Bowel: Moderate stool throughout the colon. There is no bowel obstruction or active inflammation. The appendix is normal. Vascular/Lymphatic: Advanced aortoiliac atherosclerotic disease. The IVC is unremarkable. No portal venous gas. There is no adenopathy. Reproductive: The prostate and seminal vesicles are grossly unremarkable. No pelvic mass. Other: Similar appearance of laxity of the anterior abdominal wall with a broad-based hernia containing loops of small and large bowel without evidence of obstruction. Musculoskeletal: Osteopenia with degenerative changes of the spine. Lower lumbar fusion. No acute osseous pathology. IMPRESSION: 1. Similar appearance of circumferential thickening of the soft tissues surrounding the drive line which may be chronic. Recurrent infection is not excluded. No fluid  collection. 2. No acute intrathoracic, abdominal, or pelvic pathology. 3. Aortic Atherosclerosis (ICD10-I70.0) and Emphysema (ICD10-J43.9). Electronically Signed   By: Elgie Collard M.D.   On: 09/08/2023 23:10   DG Chest 2 View  Result Date: 09/08/2023 CLINICAL DATA:  sob  Pt c/o dizziness and SOB started at 1100 today. EXAM: CHEST - 2 VIEW COMPARISON:  CT chest 07/07/2023 FINDINGS: LVAD overlies the left lower hemithorax.  Atrial appendage clip. The heart and mediastinal contours are unchanged. Atherosclerotic plaque. Left base atelectasis no focal consolidation. No pulmonary edema. No pleural effusion. No pneumothorax. No acute osseous abnormality. Intact sternotomy wires. Bilateral axillary region vascular clips. IMPRESSION: 1. No active cardiopulmonary disease. 2.  Aortic Atherosclerosis (ICD10-I70.0). Electronically Signed   By: Tish Frederickson M.D.   On: 09/08/2023 20:17     Medications:     Scheduled Medications:  gabapentin  300 mg Oral TID   insulin aspart  0-5 Units Subcutaneous QHS   insulin aspart  0-9 Units Subcutaneous TID WC   insulin glargine-yfgn  8 Units Subcutaneous Daily   ipratropium-albuterol  3 mL Inhalation Q6H   metoprolol succinate  12.5 mg Oral Daily   minocycline  200 mg Oral BID   mirtazapine  15 mg Oral QHS   pantoprazole  40 mg Oral Daily   rosuvastatin  20 mg Oral Daily   sertraline  50 mg Oral Daily   tamsulosin  0.4 mg Oral Daily   Warfarin - Pharmacist Dosing Inpatient   Does not apply q1600    Infusions:  ceFEPime (MAXIPIME) IV 2 g (09/10/23 0433)   daptomycin      PRN Medications: acetaminophen, albuterol, ondansetron (ZOFRAN) IV, traMADol   Patient Profile  Kevin Underwood. Is a 63 y.o. 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 type 2 diabetes mellitus, COPD, and  hypertension. s/p Heartmate 3 LVAD.   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   Assessment/Plan:   1.  Sepsis secondary to driveline infection: MRSA driveline infection, admitted in 6/23 and again in 7/23-8/23 with multiple trips to the OR for debridement.  Cultures have grown acinetobacter, MRSA and pseudomonas He was readmitted with driveline infection in 2/24, completed course of daptomycin for MRSA.  Discharged from the hospital on 09/04/2023 with recurrent driveline infection growing Pseudomonas.   On chronic minocycline and ciprofloxacin at home. Admitted with possible early sepsis.  His driveline does not have as much drainage as previously; unsure about the etiology of his current symptoms.  In addition, on exam he was short of breath with audible expiratory wheezing.  No wheeze today.  -SARs 2 negative. Respiratory panel negative. Suspect viral source.  -CT CAP- Similar to previous but recurrent infection possible.  - WBC down to 13  - Blood cultures NGTD x 2 days  - On cefepime + daptomycin.   2.  Chronic systolic CHF s/p Heartmate 3 LVAD on 12/10/2 - Appear euvolemic.   - BB held on admit. Increase Toprol XL to 25 mg daily which is his home dose.  - Continue warfarin, INR goal 2-2.5.  - INR pending.   3. 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  - Stable.     4.  CAD S/p CABG 12/20 - Continue Crestor 20 mg daily.    5.  Paroxysmal atrial fibrillation status post maze and left atrial appendage clip - As above restart increase Toprol XL   - Off amiodarone with hyperthyroidism.   6. Type 2 diabetes -SSI   7. 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  - UDS negative.    8. 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  Followed by endocrinology. He is now off amiodarone.  - Continue methimazole    9. Anemia (chronic blood loss):  Hgb stable. No obvious source.    10. COPD: Prior smoker.  - Continue Trelegy.    11. PVCs/NSVT: Now off amiodarone. On Toprol XL.    12.  Gout: Continue allopurinol, he has prn colchicine.   Possible d/c today if ok with Dr Alla German. INR pending.   I reviewed the LVAD parameters from today, and compared the results to the patient's prior recorded data.  No programming changes were made.  The LVAD is functioning within specified parameters.  The patient performs LVAD self-test daily.  LVAD interrogation was negative for any significant power changes, alarms or PI events/speed drops.  LVAD equipment check completed and is in good working order.  Back-up equipment present.   LVAD education done on emergency procedures and precautions and reviewed exit site care.  Length of Stay: 2  Tonye Becket, NP 09/10/2023, 7:19 AM  VAD Team --- VAD ISSUES ONLY--- Pager  [phone] (7am - 7am)  Advanced Heart Failure Team  Pager [phone] (M-F; 7a - 5p)  Please contact [facility] for night-coverage after hours (5p -7a ) and weekends on [email or web address]

## 2023-09-10 NOTE — Progress Notes (Signed)
[facility]  Date of Admission:  09/08/2023      Total days of antibiotics 2   Daptomycin   Cefepime    ASSESSMENT: Kevin Underwood. is a 62 y.o. 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 He was admitted due to worsening fatigue, body aches, chills, malaise over w/e with concern for sepsis.     Fevers, Malaise, Bodyaches, Chills, Runny Nose -  -constellation of symptoms seem more c/w viral syndrome; RVP and COVID/Flu negative. He is feeling much improved today, no fevers/chills better energy.  -D/C droplet  -Chest CT w/o any evidence of lower respiratory tract involvement. Driveline site examined with Dr. Donata Clay and Zollie Scale at bedside today - some debridement but will chance  -he has increased cigarette smoking recently and attributes some of his wheezing to that      Chronic LVAD Driveline Infection -  MRSA, Acinetobacter Baumannii, PsA -  -PVT assessed site / CT. No surgical indications as the proximity of cord to the OFG and large hernia complicate candidacy. His pain / discomfort is stable with known incisional hernia as well. -Stable chronic infection w/o exacerbation  -wound culture is growing staph aureus - he has known history of MRSA - will continue minocycline 200 mg BID for now and follow for susceptibilities to ensure we don't need to change management.  -Continue ciprofloxacin for h/o deep pseudomonas driveline involvement  -wound care management per VAD team -- increased to daily wound dressings with vashe moistened iodoform noted     PLAN: Plan to D/C on home minocycline 200 mg BID and Ciprofloxacin 750 mg BID.  Follow up cultures for Staph Aureus for further susceptibilities  ID FU arranged virtually (telephone)     Principal Problem:   Sepsis (HCC)    gabapentin  300 mg Oral TID   insulin aspart  0-15  Units Subcutaneous TID WC   insulin aspart  0-5 Units Subcutaneous QHS   insulin glargine-yfgn  10 Units Subcutaneous Daily   ipratropium-albuterol  3 mL Inhalation Q6H   metoprolol succinate  25 mg Oral Daily   minocycline  200 mg Oral BID   mirtazapine  15 mg Oral QHS   pantoprazole  40 mg Oral Daily   rosuvastatin  20 mg Oral Daily   sertraline  50 mg Oral Daily   tamsulosin  0.4 mg Oral Daily   warfarin  6 mg Oral ONCE-1600   Warfarin - Pharmacist Dosing Inpatient   Does not apply q1600    SUBJECTIVE: Feeling much better. No more chills.  No new pains / discomfort.  WBC normalized  Getting dressing change with Dr. Donata Clay and Zollie Scale    Review of Systems: Review of Systems  Constitutional:  Negative for chills and fever.  Respiratory:  Negative for cough and shortness of breath.   Gastrointestinal:  Negative for abdominal pain, nausea and vomiting.  Musculoskeletal:  Negative for myalgias.     Allergies  Allergen Reactions   Firvanq [Vancomycin] Nausea And Vomiting and Other (See Comments)    Arthralgias     OBJECTIVE: Vitals:   09/10/23 0435 09/10/23 0559 09/10/23 0802 09/10/23 0847  BP: 106/83  104/87   Pulse:      Resp: 20  20   Temp: 98.3 F (36.8 C)  98.6 F (37 C)   TempSrc: Oral  Oral   SpO2:  94%  96% 96%  Weight:  88.7 kg    Height:       Body mass index is 28.06 kg/m.  Physical Exam Vitals reviewed.  Constitutional:      Appearance: Normal appearance. He is not ill-appearing.  Cardiovascular:     Rate and Rhythm: Normal rate.  Pulmonary:     Effort: Pulmonary effort is normal.     Breath sounds: Normal breath sounds.  Abdominal:     Comments: Large hernia noted, soft, non-tender  Epigastric wound where driveline currently sits is unincorporated, stable regarding tunneling per Dr. Zenaida Niece Trigt's assessment. Changing to iodoform soaked vashe to help with biofilm. No signs of cellulitis.   Skin:    General: Skin is warm and dry.   Neurological:     Mental Status: He is alert and oriented to person, place, and time.     Lab Results Lab Results  Component Value Date   WBC 13.3 (H) 09/10/2023   HGB 10.5 (L) 09/10/2023   HCT 32.4 (L) 09/10/2023   MCV 80.0 09/10/2023   PLT 187 09/10/2023    Lab Results  Component Value Date   CREATININE 1.32 (H) 09/10/2023   BUN 20 09/10/2023   NA 133 (L) 09/10/2023   K 4.0 09/10/2023   CL 100 09/10/2023   CO2 22 09/10/2023    Lab Results  Component Value Date   ALT 16 09/08/2023   AST 17 09/08/2023   ALKPHOS 110 09/08/2023   BILITOT 0.5 09/08/2023     Microbiology: Recent Results (from the past 240 hour(s))  SARS Coronavirus 2 by RT PCR (hospital order, performed in [facility] hospital lab) *cepheid single result test* Anterior Nasal Swab     Status: None   Collection Time: 09/08/23  5:22 PM   Specimen: Anterior Nasal Swab  Result Value Ref Range Status   SARS Coronavirus 2 by RT PCR NEGATIVE NEGATIVE Final    Comment: Performed at [facility], [address] [place], [place] [address]  Culture, blood (routine x 2)     Status: None (Preliminary result)   Collection Time: 09/08/23  7:20 PM   Specimen: BLOOD LEFT ARM  Result Value Ref Range Status   Specimen Description BLOOD LEFT ARM  Final   Special Requests   Final    Blood Culture adequate volume BOTTLES DRAWN AEROBIC AND ANAEROBIC   Culture   Final    NO GROWTH 2 DAYS Performed at [facility], [address], [place], [place] [address]    Report Status PENDING  Incomplete  Culture, blood (routine x 2)     Status: None (Preliminary result)   Collection Time: 09/08/23  9:08 PM   Specimen: BLOOD LEFT ARM  Result Value Ref Range Status   Specimen Description BLOOD LEFT ARM  Final   Special Requests   Final    BOTTLES DRAWN AEROBIC AND ANAEROBIC Blood Culture adequate volume   Culture   Final    NO GROWTH 2 DAYS Performed at [facility], [address], [place], [place] [address]     Report Status PENDING  Incomplete  MRSA Next Gen by PCR, Nasal     Status: None   Collection Time: 09/08/23 11:51 PM   Specimen: Nasal Mucosa; Nasal Swab  Result Value Ref Range Status   MRSA by PCR Next Gen NOT DETECTED NOT DETECTED Final    Comment: (NOTE) The GeneXpert MRSA Assay (FDA approved for NASAL specimens only), is  one component of a comprehensive MRSA colonization surveillance program. It is not intended to diagnose MRSA infection nor to guide or monitor treatment for MRSA infections. Test performance is not FDA approved in patients less than 6 years old. Performed at [facility], [address], [place], [place] [address]   Aerobic Culture w Gram Stain (superficial specimen)     Status: None (Preliminary result)   Collection Time: 09/09/23 11:30 AM   Specimen: Wound  Result Value Ref Range Status   Specimen Description WOUND  Final   Special Requests abdomen  Final   Gram Stain NO WBC SEEN NO ORGANISMS SEEN   Final   Culture   Final    MODERATE STAPHYLOCOCCUS AUREUS SUSCEPTIBILITIES TO FOLLOW Performed at [facility], [address] [place], [place] [address]    Report Status PENDING  Incomplete  Respiratory (~20 pathogens) panel by PCR     Status: None   Collection Time: 09/09/23  3:12 PM   Specimen: Nasopharyngeal Swab; Respiratory  Result Value Ref Range Status   Adenovirus NOT DETECTED NOT DETECTED Final   Coronavirus 229E NOT DETECTED NOT DETECTED Final    Comment: (NOTE) The Coronavirus on the Respiratory Panel, DOES NOT test for the novel  Coronavirus (2019 nCoV)    Coronavirus HKU1 NOT DETECTED NOT DETECTED Final   Coronavirus NL63 NOT DETECTED NOT DETECTED Final   Coronavirus OC43 NOT DETECTED NOT DETECTED Final   Metapneumovirus NOT DETECTED NOT DETECTED Final   Rhinovirus / Enterovirus NOT DETECTED NOT DETECTED Final   Influenza A NOT DETECTED NOT DETECTED Final   Influenza B NOT DETECTED NOT DETECTED Final   Parainfluenza  Virus 1 NOT DETECTED NOT DETECTED Final   Parainfluenza Virus 2 NOT DETECTED NOT DETECTED Final   Parainfluenza Virus 3 NOT DETECTED NOT DETECTED Final   Parainfluenza Virus 4 NOT DETECTED NOT DETECTED Final   Respiratory Syncytial Virus NOT DETECTED NOT DETECTED Final   Bordetella pertussis NOT DETECTED NOT DETECTED Final   Bordetella Parapertussis NOT DETECTED NOT DETECTED Final   Chlamydophila pneumoniae NOT DETECTED NOT DETECTED Final   Mycoplasma pneumoniae NOT DETECTED NOT DETECTED Final    Comment: Performed at [facility], [address] [place], [place] [address]     Rexene Alberts, MSN, NP-C [facility]  [email or web address] Pager: [phone] Office: [phone] [facility] Main Line: [phone] *Secure Chat Communication Welcome

## 2023-09-10 NOTE — Inpatient Diabetes Management (Signed)
Inpatient Diabetes Program Recommendations  AACE/ADA: New Consensus Statement on Inpatient Glycemic Control   Target Ranges:  Prepandial:   less than 140 mg/dL      Peak postprandial:   less than 180 mg/dL (1-2 hours)      Critically ill patients:  140 - 180 mg/dL    Latest Reference Range & Units 09/09/23 05:51 09/09/23 12:13 09/09/23 16:32 09/09/23 20:54 09/10/23 05:55  Glucose-Capillary 70 - 99 mg/dL 161 (H) 096 (H) 045 (H) 303 (H) 264 (H)   Review of Glycemic Control  Diabetes history: DM2 Outpatient Diabetes medications: Glipizide 5 mg daily, Metformin 1000 mg BID Current orders for Inpatient glycemic control: Semglee 10 units daily, Novolog 0-15 units TID with meals, Novolog 0-5 units QHS   Inpatient Diabetes Program Recommendations:     Insulin: Please consider increasing Semglee to 12 units daily and ordering Novolog 3 units TID with meals for meal coverage if patient eats at least 50% of meals.  Thanks, Orlando Penner, RN, MSN, CDCES Diabetes Coordinator Inpatient Diabetes Program [phone] (Team Pager from 8am to 5pm)

## 2023-09-10 NOTE — Discharge Summary (Signed)
Advanced Heart Failure Team  Discharge Summary   Patient ID: Kevin Underwood. MRN: [id number], DOB/AGE: 1961-05-26 62 y.o. Admit date: 09/08/2023 D/C date:     09/10/2023   Primary Discharge Diagnoses:  1.  Sepsis 2.  Chronic systolic CHF s/p Heartmate 3 LVAD on 12/10/2  Secondary Diagnoses   CKD stage 2  CAD S/p CABG 12/20  Paroxysmal atrial fibrillation status post maze and left atrial appendage clip  Type 2 diabetes  Methamphetamine abuse   Hyperthyroidism Anemia (chronic blood loss)   COPD  PVCs/NSVT    Gout  Hospital Course:    [name] Is a 62 y.o. 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 type 2 diabetes mellitus, COPD, and hypertension. s/p Heartmate 3 LVAD.    Admitted with possible early sepsis . Know history of driveline infections on chronic home antibiotics. On arrival wheezing and short of breath. WBC on admit elevated. Placed on IV antibiotics. CT abd/pelvis unchanged from previous. Blood cultures negative x2 days. CT surgery consulted. Driveline wound was improving. Pan to continue conservative wound care. No surgical intervention needed.   Viral source felt to be the culprit. WBC trended down with improvement. From HF perspective/VAD he remained stable. Placed back on home HF meds. INR followed with adjustments to coumadin dose. He will continue on chronic home antibiotics and will follow up with ID.   He will contine to be followed closely in the VAD clinic.   LVAD INTERROGATION:  HeartMate III LVAD:   Flow 4.4  liters/min, speed 5600, power 4 PI 3.1     < 40 PI events   Discharge Vitals: Blood pressure 104/87, pulse (!) 109, temperature 98.6 F (37 C), temperature source Oral, resp. rate 20, height 5\' 10"  (1.778 m), weight 88.7 kg, SpO2 96%.  Labs: Lab Results  Component  Value Date   WBC 13.3 (H) 09/10/2023   HGB 10.5 (L) 09/10/2023   HCT 32.4 (L) 09/10/2023   MCV 80.0 09/10/2023   PLT 187 09/10/2023    Recent Labs  Lab 09/08/23 2105 09/09/23 0721 09/10/23 0236  NA 132*   < > 133*  K 4.1   < > 4.0  CL 98   < > 100  CO2 23   < > 22  BUN 16   < > 20  CREATININE 1.27*   < > 1.32*  CALCIUM 8.9   < > 8.3*  PROT 7.7  --   --   BILITOT 0.5  --   --   ALKPHOS 110  --   --   ALT 16  --   --   AST 17  --   --   GLUCOSE 184*   < > 249*   < > = values in this interval not displayed.   Lab Results  Component Value Date   CHOL 175 07/09/2023   HDL 45 07/09/2023   LDLCALC 91 07/09/2023   TRIG 193 (H) 07/09/2023   BNP (last 3 results) Recent Labs    09/08/23 1940  BNP 57.1    ProBNP (last 3 results) No results for input(s): "PROBNP" in the last 8760 hours.   Diagnostic Studies/Procedures   CT CHEST ABDOMEN PELVIS W CONTRAST  Result Date: 09/08/2023 CLINICAL DATA:  Sepsis.  Concern for LVAD driveline infection. EXAM: CT CHEST, ABDOMEN, AND PELVIS WITH  CONTRAST TECHNIQUE: Multidetector CT imaging of the chest, abdomen and pelvis was performed following the standard protocol during bolus administration of intravenous contrast. RADIATION DOSE REDUCTION: This exam was performed according to the departmental dose-optimization program which includes automated exposure control, adjustment of the mA and/or kV according to patient size and/or use of iterative reconstruction technique. CONTRAST:  75mL OMNIPAQUE IOHEXOL 350 MG/ML SOLN COMPARISON:  CT of the chest abdomen pelvis dated 07/07/2023. FINDINGS: CT CHEST FINDINGS Cardiovascular: There is no cardiomegaly or pericardial effusion. Advanced 3 vessel coronary vascular calcification and left atrial appendage occlusive device. Left ventricular assist device with associated streak artifact. Similar appearance of circumferential thickening of the soft tissues surrounding the drive line which may be chronic.  Recurrent infection is not excluded. No fluid collection. There is moderate atherosclerotic calcification of the thoracic aorta. No aneurysmal dilatation or dissection. The origins of the great vessels of the aortic arch and the central pulmonary arteries appear patent as visualized. Mediastinum/Nodes: No hilar or mediastinal adenopathy. The esophagus and the thyroid gland are grossly unremarkable. No mediastinal fluid collection. Lungs/Pleura: Background of centrilobular emphysema. No focal consolidation, pleural effusion, or pneumothorax. Bibasilar linear atelectasis/scarring. The central airways are patent. Musculoskeletal: Median sternotomy wires. Degenerative changes of the spine. No acute osseous pathology. CT ABDOMEN PELVIS FINDINGS No intra-abdominal free air or free fluid. Hepatobiliary: Mild fatty infiltration of the liver. No biliary dilatation. The gallbladder is unremarkable. Pancreas: Unremarkable. No pancreatic ductal dilatation or surrounding inflammatory changes. Spleen: Normal in size without focal abnormality. Adrenals/Urinary Tract: The adrenal glands unremarkable. Vascular calcification versus a 2 mm nonobstructing left renal upper pole calculus. There is no hydronephrosis on either side. There is symmetric enhancement and excretion of contrast by both kidneys. The visualized ureters and urinary bladder appear unremarkable. Stomach/Bowel: Moderate stool throughout the colon. There is no bowel obstruction or active inflammation. The appendix is normal. Vascular/Lymphatic: Advanced aortoiliac atherosclerotic disease. The IVC is unremarkable. No portal venous gas. There is no adenopathy. Reproductive: The prostate and seminal vesicles are grossly unremarkable. No pelvic mass. Other: Similar appearance of laxity of the anterior abdominal wall with a broad-based hernia containing loops of small and large bowel without evidence of obstruction. Musculoskeletal: Osteopenia with degenerative changes of  the spine. Lower lumbar fusion. No acute osseous pathology. IMPRESSION: 1. Similar appearance of circumferential thickening of the soft tissues surrounding the drive line which may be chronic. Recurrent infection is not excluded. No fluid collection. 2. No acute intrathoracic, abdominal, or pelvic pathology. 3. Aortic Atherosclerosis (ICD10-I70.0) and Emphysema (ICD10-J43.9). Electronically Signed   By: Elgie Collard M.D.   On: 09/08/2023 23:10   DG Chest 2 View  Result Date: 09/08/2023 CLINICAL DATA:  sob  Pt c/o dizziness and SOB started at 1100 today. EXAM: CHEST - 2 VIEW COMPARISON:  CT chest 07/07/2023 FINDINGS: LVAD overlies the left lower hemithorax.  Atrial appendage clip. The heart and mediastinal contours are unchanged. Atherosclerotic plaque. Left base atelectasis no focal consolidation. No pulmonary edema. No pleural effusion. No pneumothorax. No acute osseous abnormality. Intact sternotomy wires. Bilateral axillary region vascular clips. IMPRESSION: 1. No active cardiopulmonary disease. 2.  Aortic Atherosclerosis (ICD10-I70.0). Electronically Signed   By: Tish Frederickson M.D.   On: 09/08/2023 20:17    Discharge Medications   Allergies as of 09/10/2023       Reactions   Firvanq [vancomycin] Nausea And Vomiting, Other (See Comments)   Arthralgias         Medication List     TAKE these  medications    acetaminophen 325 MG tablet Commonly known as: TYLENOL Take 2 tablets (650 mg total) by mouth every 4 (four) hours as needed for headache or mild pain.   albuterol 108 (90 Base) MCG/ACT inhaler Commonly known as: VENTOLIN HFA Inhale 2 puffs into the lungs every 6 (six) hours as needed for wheezing or shortness of breath. What changed: Another medication with the same name was changed. Make sure you understand how and when to take each.   albuterol (2.5 MG/3ML) 0.083% nebulizer solution Commonly known as: PROVENTIL INHALE 3 ML BY NEBULIZATION EVERY 6 HOURS AS NEEDED FOR  WHEEZING OR SHORTNESS OF BREATH What changed: See the new instructions.   allopurinol 100 MG tablet Commonly known as: ZYLOPRIM Take 1 tablet (100 mg total) by mouth daily.   amLODipine 10 MG tablet Commonly known as: NORVASC Take 1 tablet (10 mg total) by mouth daily.   ciprofloxacin 750 MG tablet Commonly known as: CIPRO Take 1 tablet (750 mg total) by mouth 2 (two) times daily.   colchicine 0.6 MG tablet Take 1 tablet (0.6 mg total) by mouth daily as needed (gout pain). What changed: when to take this   Combivent Respimat 20-100 MCG/ACT Aers respimat Generic drug: Ipratropium-Albuterol Inhale 1 puff into the lungs every 6 (six) hours.   Flonase Allergy Relief 50 MCG/ACT nasal spray Generic drug: fluticasone Place 2 sprays into both nostrils as needed for allergies or rhinitis.   gabapentin 300 MG capsule Commonly known as: NEURONTIN Take 1 capsule (300 mg total) by mouth 3 (three) times daily.   glipiZIDE 5 MG tablet Commonly known as: Glucotrol Take 1 tablet (5 mg total) by mouth daily.   guaifenesin 400 MG Tabs tablet Commonly known as: HUMIBID E Take 400 mg by mouth every 6 (six) hours as needed (for congestion).   loratadine 10 MG tablet Commonly known as: CLARITIN Take 1 tablet (10 mg total) by mouth daily.   losartan 50 MG tablet Commonly known as: COZAAR Take 1 tablet (50 mg total) by mouth daily.   metFORMIN 1000 MG tablet Commonly known as: GLUCOPHAGE Take 1 tablet (1,000 mg total) by mouth 2 (two) times daily.   metoprolol succinate 25 MG 24 hr tablet Commonly known as: Toprol XL Take 1 tablet (25 mg total) by mouth daily.   minocycline 100 MG capsule Commonly known as: MINOCIN Take 2 capsules (200 mg total) by mouth 2 (two) times daily.   mirtazapine 15 MG tablet Commonly known as: REMERON Take 1 tablet (15 mg total) by mouth at bedtime.   multivitamin with minerals Tabs tablet Take 1 tablet by mouth daily.   Oxycodone HCl 10 MG  Tabs Take 1 tablet (10 mg total) by mouth every 6 (six) hours as needed.   pantoprazole 40 MG tablet Commonly known as: PROTONIX Take 1 tablet (40 mg total) by mouth daily.   rosuvastatin 20 MG tablet Commonly known as: CRESTOR Take 1 tablet (20 mg total) by mouth daily.   sertraline 50 MG tablet Commonly known as: ZOLOFT Take 1 tablet (50 mg total) by mouth daily.   spironolactone 25 MG tablet Commonly known as: ALDACTONE Take 0.5 tablets (12.5 mg total) by mouth daily.   tamsulosin 0.4 MG Caps capsule Commonly known as: FLOMAX Take 1 capsule (0.4 mg total) by mouth daily.   thiamine 100 MG tablet Commonly known as: Vitamin B-1 Take 1 tablet (100 mg total) by mouth daily.   traMADol 50 MG tablet Commonly known as: ULTRAM Take  2 tablets (100 mg total) by mouth every 12 (twelve) hours as needed.   traZODone 150 MG tablet Commonly known as: DESYREL Take 1 tablet (150 mg total) by mouth at bedtime.   Trelegy Ellipta 100-62.5-25 MCG/ACT Aepb Generic drug: Fluticasone-Umeclidin-Vilant Inhale 1 puff into the lungs daily.   warfarin 4 MG tablet Commonly known as: COUMADIN Take as directed. If you are unsure how to take this medication, talk to your nurse or doctor. Original instructions: Take 1-1.5 tablets (4-6 mg total) by mouth See admin instructions. Take 1 tablet (4 mg) every Tuesday and Thursday, Take 1.5 tabs (6 mg) all other days        Disposition   The patient will be discharged in stable condition to home. Discharge Instructions     (HEART FAILURE PATIENTS) Call MD:  Anytime you have any of the following symptoms: 1) 3 pound weight gain in 24 hours or 5 pounds in 1 week 2) shortness of breath, with or without a dry hacking cough 3) swelling in the hands, feet or stomach 4) if you have to sleep on extra pillows at night in order to breathe.   Complete by: As directed    Diet - low sodium heart healthy   Complete by: As directed    Increase activity slowly    Complete by: As directed    Page VAD Coordinator at [phone]  Notify for: any VAD alarms, sustained elevations of power >10 watts, sustained drop in Pulse Index <3   Complete by: As directed    Notify for:  any VAD alarms sustained elevations of power >10 watts sustained drop in Pulse Index <3     Speed Settings:   Complete by: As directed    Fixed 5600 RPM Low 5300 RPM          Duration of Discharge Encounter: Greater than 35 minutes   Signed, Akiva Brassfield NP-C  09/10/2023, 9:18 AM

## 2023-09-10 NOTE — Progress Notes (Signed)
  Subjective: Patient examined and wound care personally performed with Vashe packing of VAD tunnel with 1/2 inch Nugauze under steirle technique. Wound culture growing staph- ID and sensitivity to follow. WBC down to 13k, patient feels much better- eating meals, had BM  Objective: Vital signs in last 24 hours: Temp:  [98.3 F (36.8 C)-100.3 F (37.9 C)] 98.6 F (37 C) (10/02 0802) Pulse Rate:  [73-109] 109 (10/01 1938) Cardiac Rhythm: Sinus tachycardia (10/02 0802) Resp:  [15-27] 20 (10/02 0802) BP: (88-114)/(63-87) 104/87 (10/02 0802) SpO2:  [93 %-97 %] 96 % (10/02 0847) Weight:  [88.7 kg] 88.7 kg (10/02 0559)  Hemodynamic parameters for last 24 hours:  Nsr afebrile  Intake/Output from previous day: 10/01 0701 - 10/02 0700 In: 1078.1 [P.O.:820; IV Piggyback:258.1] Out: 1500 [Urine:1500] Intake/Output this shift: No intake/output data recorded.       Exam    General- alert and comfortable    VAD tunnel wound clean, 2cm depth surrounding power cord. Packe with Nugauze - Vashe.    Neck- no JVD, no cervical adenopathy palpable, no carotid bruit   Lungs- clear without rales, wheezes   Cor- regular rate and rhythm, normal VAD hum   Abdomen- soft, non-tender   Extremities - warm, non-tender, minimal edema   Neuro- oriented, appropriate, no focal weakness   Lab Results: Recent Labs    09/09/23 0721 09/10/23 0236  WBC 14.9* 13.3*  HGB 11.3* 10.5*  HCT 34.2* 32.4*  PLT 188 187   BMET:  Recent Labs    09/09/23 0721 09/10/23 0236  NA 131* 133*  K 3.8 4.0  CL 98 100  CO2 24 22  GLUCOSE 262* 249*  BUN 17 20  CREATININE 1.29* 1.32*  CALCIUM 8.5* 8.3*    PT/INR:  Recent Labs    09/10/23 0758  LABPROT 26.8*  INR 2.5*   ABG    Component Value Date/Time   PHART 7.508 (H) 11/20/2020 0438   HCO3 24.8 09/09/2021 0313   TCO2 29 11/20/2020 0438   ACIDBASEDEF 6.0 (H) 11/24/2019 1718   O2SAT 63.4 09/09/2021 0313   CBG (last 3)  Recent Labs     09/09/23 1632 09/09/23 2054 09/10/23 0555  GLUCAP 238* 303* 264*    Assessment/Plan: S/P HM3 implant 2021- has developed VAD tunnel infection. Requiring multiple debridements.Patient is not a candidate for pump exchange with 2 prior sternotomies. Patient recovered from probable respiratory virus.Viral panel ok. Will follow ID recommendation for oral antibiotics and follow as outpatient in VAD clinic. I instructed the patients wife on packing the wound with Gauze strips/ Vashe.   LOS: 2 days    Lovett Sox 09/10/2023

## 2023-09-11 LAB — AEROBIC CULTURE W GRAM STAIN (SUPERFICIAL SPECIMEN): Gram Stain: NONE SEEN

## 2023-09-13 LAB — CULTURE, BLOOD (ROUTINE X 2)
Culture: NO GROWTH
Culture: NO GROWTH
Special Requests: ADEQUATE
Special Requests: ADEQUATE

## 2023-09-17 ENCOUNTER — Other Ambulatory Visit: Payer: Self-pay

## 2023-09-17 ENCOUNTER — Other Ambulatory Visit (HOSPITAL_COMMUNITY): Payer: Self-pay

## 2023-09-17 DIAGNOSIS — Z95811 Presence of heart assist device: Secondary | ICD-10-CM

## 2023-09-17 DIAGNOSIS — Z7901 Long term (current) use of anticoagulants: Secondary | ICD-10-CM

## 2023-09-18 ENCOUNTER — Encounter (HOSPITAL_COMMUNITY): Payer: Self-pay

## 2023-09-18 ENCOUNTER — Telehealth: Payer: 59 | Admitting: Internal Medicine

## 2023-09-18 ENCOUNTER — Encounter (HOSPITAL_COMMUNITY): Payer: 59 | Admitting: Cardiology

## 2023-09-18 ENCOUNTER — Other Ambulatory Visit: Payer: Self-pay

## 2023-09-18 ENCOUNTER — Other Ambulatory Visit (HOSPITAL_COMMUNITY): Payer: Self-pay

## 2023-09-18 ENCOUNTER — Telehealth: Payer: Self-pay

## 2023-09-18 NOTE — Telephone Encounter (Signed)
Attempted to call patient x2 to regarding phone visit today. Not able to reach patient on first number. Left voicemail with patient's spouse number. Will need to reschedule appt.  Kevin Underwood, RMA

## 2023-09-23 ENCOUNTER — Encounter (HOSPITAL_COMMUNITY): Payer: Self-pay | Admitting: Cardiology

## 2023-09-23 ENCOUNTER — Other Ambulatory Visit (HOSPITAL_COMMUNITY): Payer: Self-pay

## 2023-09-23 ENCOUNTER — Other Ambulatory Visit: Payer: Self-pay

## 2023-09-23 ENCOUNTER — Ambulatory Visit (HOSPITAL_COMMUNITY)
Admission: RE | Admit: 2023-09-23 | Discharge: 2023-09-23 | Disposition: A | Discharge status: Home / Self Care | Payer: 59 | Source: Ambulatory Visit | Attending: Cardiology | Admitting: Cardiology

## 2023-09-23 ENCOUNTER — Encounter (HOSPITAL_COMMUNITY): Payer: Self-pay

## 2023-09-23 ENCOUNTER — Inpatient Hospital Stay (HOSPITAL_COMMUNITY): Payer: 59

## 2023-09-23 ENCOUNTER — Inpatient Hospital Stay (HOSPITAL_COMMUNITY)
Admission: EM | Admit: 2023-09-23 | Discharge: 2023-10-02 | DRG: 314 | Disposition: A | Discharge status: Home / Self Care | Payer: 59 | Source: Ambulatory Visit | Attending: Cardiology | Admitting: Cardiology

## 2023-09-23 DIAGNOSIS — N1831 Chronic kidney disease, stage 3a: Secondary | ICD-10-CM | POA: Diagnosis present

## 2023-09-23 DIAGNOSIS — E872 Acidosis, unspecified: Secondary | ICD-10-CM | POA: Diagnosis present

## 2023-09-23 DIAGNOSIS — Z794 Long term (current) use of insulin: Secondary | ICD-10-CM | POA: Diagnosis not present

## 2023-09-23 DIAGNOSIS — B965 Pseudomonas (aeruginosa) (mallei) (pseudomallei) as the cause of diseases classified elsewhere: Secondary | ICD-10-CM | POA: Diagnosis present

## 2023-09-23 DIAGNOSIS — K56609 Unspecified intestinal obstruction, unspecified as to partial versus complete obstruction: Secondary | ICD-10-CM

## 2023-09-23 DIAGNOSIS — I38 Endocarditis, valve unspecified: Secondary | ICD-10-CM | POA: Diagnosis not present

## 2023-09-23 DIAGNOSIS — J449 Chronic obstructive pulmonary disease, unspecified: Secondary | ICD-10-CM | POA: Diagnosis present

## 2023-09-23 DIAGNOSIS — B9562 Methicillin resistant Staphylococcus aureus infection as the cause of diseases classified elsewhere: Principal | ICD-10-CM | POA: Diagnosis present

## 2023-09-23 DIAGNOSIS — Z1152 Encounter for screening for COVID-19: Secondary | ICD-10-CM | POA: Diagnosis not present

## 2023-09-23 DIAGNOSIS — N179 Acute kidney failure, unspecified: Secondary | ICD-10-CM | POA: Diagnosis present

## 2023-09-23 DIAGNOSIS — Z7984 Long term (current) use of oral hypoglycemic drugs: Secondary | ICD-10-CM

## 2023-09-23 DIAGNOSIS — G8929 Other chronic pain: Secondary | ICD-10-CM | POA: Diagnosis present

## 2023-09-23 DIAGNOSIS — Z1629 Resistance to other single specified antibiotic: Secondary | ICD-10-CM | POA: Diagnosis present

## 2023-09-23 DIAGNOSIS — K439 Ventral hernia without obstruction or gangrene: Secondary | ICD-10-CM | POA: Diagnosis present

## 2023-09-23 DIAGNOSIS — A4902 Methicillin resistant Staphylococcus aureus infection, unspecified site: Secondary | ICD-10-CM | POA: Diagnosis not present

## 2023-09-23 DIAGNOSIS — I493 Ventricular premature depolarization: Secondary | ICD-10-CM | POA: Diagnosis present

## 2023-09-23 DIAGNOSIS — F1511 Other stimulant abuse, in remission: Secondary | ICD-10-CM | POA: Diagnosis present

## 2023-09-23 DIAGNOSIS — E0590 Thyrotoxicosis, unspecified without thyrotoxic crisis or storm: Secondary | ICD-10-CM | POA: Diagnosis present

## 2023-09-23 DIAGNOSIS — Y831 Surgical operation with implant of artificial internal device as the cause of abnormal reaction of the patient, or of later complication, without mention of misadventure at the time of the procedure: Secondary | ICD-10-CM | POA: Diagnosis present

## 2023-09-23 DIAGNOSIS — Z951 Presence of aortocoronary bypass graft: Secondary | ICD-10-CM

## 2023-09-23 DIAGNOSIS — E43 Unspecified severe protein-calorie malnutrition: Secondary | ICD-10-CM | POA: Diagnosis present

## 2023-09-23 DIAGNOSIS — I472 Ventricular tachycardia, unspecified: Secondary | ICD-10-CM | POA: Diagnosis present

## 2023-09-23 DIAGNOSIS — T827XXD Infection and inflammatory reaction due to other cardiac and vascular devices, implants and grafts, subsequent encounter: Secondary | ICD-10-CM | POA: Diagnosis not present

## 2023-09-23 DIAGNOSIS — I480 Paroxysmal atrial fibrillation: Secondary | ICD-10-CM | POA: Diagnosis present

## 2023-09-23 DIAGNOSIS — Z95811 Presence of heart assist device: Secondary | ICD-10-CM

## 2023-09-23 DIAGNOSIS — I130 Hypertensive heart and chronic kidney disease with heart failure and stage 1 through stage 4 chronic kidney disease, or unspecified chronic kidney disease: Secondary | ICD-10-CM | POA: Diagnosis present

## 2023-09-23 DIAGNOSIS — Z5982 Transportation insecurity: Secondary | ICD-10-CM

## 2023-09-23 DIAGNOSIS — R1084 Generalized abdominal pain: Secondary | ICD-10-CM

## 2023-09-23 DIAGNOSIS — E1122 Type 2 diabetes mellitus with diabetic chronic kidney disease: Secondary | ICD-10-CM | POA: Diagnosis present

## 2023-09-23 DIAGNOSIS — I5022 Chronic systolic (congestive) heart failure: Secondary | ICD-10-CM | POA: Diagnosis present

## 2023-09-23 DIAGNOSIS — Z79899 Other long term (current) drug therapy: Secondary | ICD-10-CM

## 2023-09-23 DIAGNOSIS — Z7901 Long term (current) use of anticoagulants: Secondary | ICD-10-CM

## 2023-09-23 DIAGNOSIS — D500 Iron deficiency anemia secondary to blood loss (chronic): Secondary | ICD-10-CM | POA: Diagnosis present

## 2023-09-23 DIAGNOSIS — K567 Ileus, unspecified: Secondary | ICD-10-CM | POA: Diagnosis present

## 2023-09-23 DIAGNOSIS — I2510 Atherosclerotic heart disease of native coronary artery without angina pectoris: Secondary | ICD-10-CM | POA: Diagnosis present

## 2023-09-23 DIAGNOSIS — Z8249 Family history of ischemic heart disease and other diseases of the circulatory system: Secondary | ICD-10-CM

## 2023-09-23 DIAGNOSIS — Z515 Encounter for palliative care: Secondary | ICD-10-CM | POA: Diagnosis not present

## 2023-09-23 DIAGNOSIS — T827XXA Infection and inflammatory reaction due to other cardiac and vascular devices, implants and grafts, initial encounter: Principal | ICD-10-CM | POA: Diagnosis present

## 2023-09-23 DIAGNOSIS — Z87891 Personal history of nicotine dependence: Secondary | ICD-10-CM

## 2023-09-23 DIAGNOSIS — T462X5A Adverse effect of other antidysrhythmic drugs, initial encounter: Secondary | ICD-10-CM | POA: Diagnosis present

## 2023-09-23 DIAGNOSIS — Z555 Less than a high school diploma: Secondary | ICD-10-CM

## 2023-09-23 DIAGNOSIS — R109 Unspecified abdominal pain: Secondary | ICD-10-CM | POA: Diagnosis present

## 2023-09-23 DIAGNOSIS — Z5986 Financial insecurity: Secondary | ICD-10-CM

## 2023-09-23 LAB — CBC
HCT: 40.7 % (ref 39.0–52.0)
Hemoglobin: 13.8 g/dL (ref 13.0–17.0)
MCH: 26.5 pg (ref 26.0–34.0)
MCHC: 33.9 g/dL (ref 30.0–36.0)
MCV: 78.3 fL — ABNORMAL LOW (ref 80.0–100.0)
Platelets: 378 10*3/uL (ref 150–400)
RBC: 5.2 MIL/uL (ref 4.22–5.81)
RDW: 14.3 % (ref 11.5–15.5)
WBC: 16 10*3/uL — ABNORMAL HIGH (ref 4.0–10.5)
nRBC: 0 % (ref 0.0–0.2)

## 2023-09-23 LAB — BASIC METABOLIC PANEL
Anion gap: 19 — ABNORMAL HIGH (ref 5–15)
BUN: 22 mg/dL (ref 8–23)
CO2: 21 mmol/L — ABNORMAL LOW (ref 22–32)
Calcium: 9.6 mg/dL (ref 8.9–10.3)
Chloride: 96 mmol/L — ABNORMAL LOW (ref 98–111)
Creatinine, Ser: 1.11 mg/dL (ref 0.61–1.24)
GFR, Estimated: >60 mL/min (ref >60)
Glucose, Bld: 211 mg/dL — ABNORMAL HIGH (ref 70–99)
Potassium: 4.3 mmol/L (ref 3.5–5.1)
Sodium: 136 mmol/L (ref 135–145)

## 2023-09-23 LAB — I-STAT CHEM 8, ED
BUN: 20 mg/dL (ref 8–23)
Calcium, Ion: 1.03 mmol/L — ABNORMAL LOW (ref 1.15–1.40)
Chloride: 102 mmol/L (ref 98–111)
Creatinine, Ser: 0.8 mg/dL (ref 0.61–1.24)
Glucose, Bld: 196 mg/dL — ABNORMAL HIGH (ref 70–99)
HCT: 35 % — ABNORMAL LOW (ref 39.0–52.0)
Hemoglobin: 11.9 g/dL — ABNORMAL LOW (ref 13.0–17.0)
Potassium: 3.5 mmol/L (ref 3.5–5.1)
Sodium: 138 mmol/L (ref 135–145)
TCO2: 21 mmol/L — ABNORMAL LOW (ref 22–32)

## 2023-09-23 LAB — HEPATIC FUNCTION PANEL
ALT: 25 U/L (ref 0–44)
AST: 22 U/L (ref 15–41)
Albumin: 3.2 g/dL — ABNORMAL LOW (ref 3.5–5.0)
Alkaline Phosphatase: 141 U/L — ABNORMAL HIGH (ref 38–126)
Bilirubin, Direct: 0.1 mg/dL (ref 0.0–0.2)
Indirect Bilirubin: 0.5 mg/dL (ref 0.3–0.9)
Total Bilirubin: 0.6 mg/dL (ref 0.3–1.2)
Total Protein: 7.8 g/dL (ref 6.5–8.1)

## 2023-09-23 LAB — RAPID URINE DRUG SCREEN, HOSP PERFORMED
Amphetamines: NOT DETECTED
Barbiturates: NOT DETECTED
Benzodiazepines: NOT DETECTED
Cocaine: NOT DETECTED
Opiates: NOT DETECTED
Tetrahydrocannabinol: NOT DETECTED

## 2023-09-23 LAB — LACTIC ACID, PLASMA
Lactic Acid, Venous: 3.6 mmol/L (ref 0.5–1.9)
Lactic Acid, Venous: 2.6 mmol/L (ref 0.5–1.9)

## 2023-09-23 LAB — MAGNESIUM: Magnesium: 1.8 mg/dL (ref 1.7–2.4)

## 2023-09-23 LAB — GLUCOSE, CAPILLARY
Glucose-Capillary: 178 mg/dL — ABNORMAL HIGH (ref 70–99)
Glucose-Capillary: 173 mg/dL — ABNORMAL HIGH (ref 70–99)

## 2023-09-23 LAB — RESP PANEL BY RT-PCR (RSV, FLU A&B, COVID)  RVPGX2
Influenza A by PCR: NEGATIVE
Influenza B by PCR: NEGATIVE
Resp Syncytial Virus by PCR: NEGATIVE
SARS Coronavirus 2 by RT PCR: NEGATIVE

## 2023-09-23 LAB — LIPASE, BLOOD: Lipase: 50 U/L (ref 11–51)

## 2023-09-23 LAB — LACTATE DEHYDROGENASE: LDH: 181 U/L (ref 98–192)

## 2023-09-23 LAB — PROTIME-INR
INR: 3.1 — ABNORMAL HIGH (ref 0.8–1.2)
Prothrombin Time: 31.9 s — ABNORMAL HIGH (ref 11.4–15.2)

## 2023-09-23 MED ORDER — MIRTAZAPINE 7.5 MG PO TABS
15.0000 mg | ORAL_TABLET | Freq: Every day | ORAL | Status: DC
  Administered 2023-09-23 – 2023-10-01 (×8): 15 mg via ORAL
  Filled 2023-09-23 (×9): qty 2

## 2023-09-23 MED ORDER — MINOCYCLINE HCL 50 MG PO CAPS
200.0000 mg | ORAL_CAPSULE | Freq: Two times a day (BID) | ORAL | Status: DC
  Administered 2023-09-23 – 2023-10-02 (×17): 200 mg via ORAL
  Filled 2023-09-23 (×3): qty 4
  Filled 2023-09-23: qty 2
  Filled 2023-09-23 (×15): qty 4

## 2023-09-23 MED ORDER — ONDANSETRON HCL 4 MG/2ML IJ SOLN: 4.0000 mg | Freq: Four times a day (QID) | INTRAMUSCULAR | Status: DC | PRN

## 2023-09-23 MED ORDER — MORPHINE SULFATE (PF) 2 MG/ML IV SOLN: 2.0000 mg | INTRAVENOUS | Status: DC | PRN

## 2023-09-23 MED ORDER — LOSARTAN POTASSIUM 50 MG PO TABS
50.0000 mg | ORAL_TABLET | Freq: Every day | ORAL | Status: DC
  Administered 2023-09-23 – 2023-09-24 (×2): 50 mg via ORAL
  Filled 2023-09-23 (×2): qty 1

## 2023-09-23 MED ORDER — TAMSULOSIN HCL 0.4 MG PO CAPS
0.4000 mg | ORAL_CAPSULE | Freq: Every day | ORAL | Status: DC
  Administered 2023-09-23 – 2023-10-02 (×10): 0.4 mg via ORAL
  Filled 2023-09-23 (×10): qty 1

## 2023-09-23 MED ORDER — METOPROLOL SUCCINATE ER 25 MG PO TB24
25.0000 mg | ORAL_TABLET | Freq: Every day | ORAL | Status: DC
  Administered 2023-09-23 – 2023-10-02 (×10): 25 mg via ORAL
  Filled 2023-09-23 (×10): qty 1

## 2023-09-23 MED ORDER — CIPROFLOXACIN HCL 750 MG PO TABS
750.0000 mg | ORAL_TABLET | Freq: Two times a day (BID) | ORAL | Status: DC
  Administered 2023-09-23 – 2023-09-24 (×2): 750 mg via ORAL
  Filled 2023-09-23 (×2): qty 1

## 2023-09-23 MED ORDER — ROSUVASTATIN CALCIUM 20 MG PO TABS
20.0000 mg | ORAL_TABLET | Freq: Every day | ORAL | Status: DC
  Administered 2023-09-24 – 2023-10-02 (×9): 20 mg via ORAL
  Filled 2023-09-23 (×9): qty 1

## 2023-09-23 MED ORDER — FENTANYL CITRATE PF 50 MCG/ML IJ SOSY
50.0000 ug | PREFILLED_SYRINGE | INTRAMUSCULAR | Status: DC | PRN
  Administered 2023-09-23 (×2): 50 ug via INTRAVENOUS
  Filled 2023-09-23 (×2): qty 1

## 2023-09-23 MED ORDER — HYDROMORPHONE HCL 1 MG/ML IJ SOLN
0.5000 mg | INTRAMUSCULAR | Status: DC | PRN
  Administered 2023-09-23 – 2023-10-01 (×41): 0.5 mg via INTRAVENOUS
  Filled 2023-09-23 (×10): qty 0.5
  Filled 2023-09-23: qty 1
  Filled 2023-09-23 (×17): qty 0.5
  Filled 2023-09-23: qty 1
  Filled 2023-09-23 (×14): qty 0.5

## 2023-09-23 MED ORDER — ONDANSETRON HCL 4 MG/2ML IJ SOLN
4.0000 mg | Freq: Four times a day (QID) | INTRAMUSCULAR | Status: DC | PRN
  Administered 2023-09-23 – 2023-09-30 (×3): 4 mg via INTRAVENOUS
  Filled 2023-09-23 (×3): qty 2

## 2023-09-23 MED ORDER — SPIRONOLACTONE 12.5 MG HALF TABLET
12.5000 mg | ORAL_TABLET | Freq: Every day | ORAL | Status: DC
  Administered 2023-09-23 – 2023-09-30 (×8): 12.5 mg via ORAL
  Filled 2023-09-23 (×9): qty 1

## 2023-09-23 MED ORDER — LACTATED RINGERS IV BOLUS
500.0000 mL | Freq: Once | INTRAVENOUS | Status: AC
  Administered 2023-09-23: 500 mL via INTRAVENOUS

## 2023-09-23 MED ORDER — AMLODIPINE BESYLATE 10 MG PO TABS
10.0000 mg | ORAL_TABLET | Freq: Every day | ORAL | Status: DC
  Administered 2023-09-23: 10 mg via ORAL
  Filled 2023-09-23: qty 1

## 2023-09-23 MED ORDER — IOHEXOL 350 MG/ML SOLN
75.0000 mL | Freq: Once | INTRAVENOUS | Status: AC | PRN
  Administered 2023-09-23: 75 mL via INTRAVENOUS

## 2023-09-23 MED ORDER — ALLOPURINOL 100 MG PO TABS
100.0000 mg | ORAL_TABLET | Freq: Every day | ORAL | Status: DC
  Administered 2023-09-23 – 2023-10-02 (×10): 100 mg via ORAL
  Filled 2023-09-23 (×10): qty 1

## 2023-09-23 MED ORDER — LOSARTAN POTASSIUM 50 MG PO TABS
50.0000 mg | ORAL_TABLET | Freq: Every day | ORAL | Status: DC
Start: 2023-09-23 — End: 2023-09-23

## 2023-09-23 MED ORDER — SERTRALINE HCL 25 MG PO TABS
50.0000 mg | ORAL_TABLET | Freq: Every day | ORAL | Status: DC
  Administered 2023-09-23 – 2023-10-02 (×10): 50 mg via ORAL
  Filled 2023-09-23 (×10): qty 2

## 2023-09-23 MED ORDER — HYDRALAZINE HCL 20 MG/ML IJ SOLN: 10.0000 mg | INTRAMUSCULAR | Status: DC | PRN

## 2023-09-23 NOTE — Plan of Care (Signed)
  Problem: Education: Goal: Patient will understand all VAD equipment and how it functions Outcome: Progressing Goal: Patient will be able to verbalize current INR target range and antiplatelet therapy for discharge home Outcome: Progressing   Problem: Cardiac: Goal: LVAD will function as expected and patient will experience no clinical alarms Outcome: Progressing   Problem: Health Behavior/Discharge Planning: Goal: Ability to manage health-related needs will improve Outcome: Progressing   Problem: Clinical Measurements: Goal: Ability to maintain clinical measurements within normal limits will improve Outcome: Progressing Goal: Will remain free from infection Outcome: Progressing Goal: Diagnostic test results will improve Outcome: Progressing Goal: Respiratory complications will improve Outcome: Progressing Goal: Cardiovascular complication will be avoided Outcome: Progressing   Problem: Nutrition: Goal: Adequate nutrition will be maintained Outcome: Progressing   Problem: Coping: Goal: Level of anxiety will decrease Outcome: Progressing   Problem: Elimination: Goal: Will not experience complications related to bowel motility Outcome: Progressing Goal: Will not experience complications related to urinary retention Outcome: Progressing   Problem: Pain Managment: Goal: General experience of comfort will improve Outcome: Progressing   Problem: Safety: Goal: Ability to remain free from injury will improve Outcome: Progressing   Problem: Skin Integrity: Goal: Risk for impaired skin integrity will decrease Outcome: Progressing

## 2023-09-23 NOTE — ED Notes (Signed)
Second lactic sent to lab

## 2023-09-23 NOTE — Progress Notes (Signed)
Paged VAD coordinator about patient blood pressure 160/131 and map of 140. Pulse index of 9.4-10.4. Awaiting for orders.

## 2023-09-23 NOTE — H&P (Addendum)
Advanced Heart Failure VAD History and Physical Note   PCP-Cardiologist: Nona Dell, MD   Reason for Admission: Abdominal pain, pain control  HPI:    [name] Is a 62 y.o. 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 type 2 diabetes mellitus, COPD, and hypertension. s/p Heartmate 3 LVAD.   Has history of chronic driveline infections, most recently in July 2024. At that time, wound cultures grew MSSA and Pseudomonas. Completed long course of IV abx. Now on chronic suppressive PO antibiotics.   Admitted 09/08/23 with possible early sepsis. On arrival wheezing and short of breath. Placed on IV antibiotics. CT abd/pelvis unchanged from previous. Blood cultures negative x2 days. CT surgery consulted. Driveline wound was improving. Pan to continue conservative wound care. Viral source felt to be the culprit. WBC trended down with improvement.   He presented to VAD clinic for follow-up today and noted worsening abdominal pain X 2-3 days. Pain associated with decreased appetite, nausea and dry heaves. He woke up at 1 am with severe 10/10 pain, feels deeper than his hernia. He had one small bowel movement last night and again this am.   He was transferred to ED for pain control and emergent imaging with plans for admission to progressive care unit.    LVAD INTERROGATION:  HeartMate II LVAD:  Flow 3.5 liters/min, speed 5600, power 4.6, PI > 50.       Home Medications Prior to Admission medications   Medication Sig Start Date End Date Taking? Authorizing Provider  acetaminophen (TYLENOL) 325 MG tablet Take 2 tablets (650 mg total) by mouth every 4 (four) hours as needed for headache or mild pain. 02/20/23  Yes Alen Bleacher, NP  albuterol (PROVENTIL) (2.5 MG/3ML) 0.083% nebulizer solution INHALE 3 ML BY  NEBULIZATION EVERY 6 HOURS AS NEEDED FOR WHEEZING OR SHORTNESS OF BREATH Patient taking differently: Take 2.5 mg by nebulization every 6 (six) hours as needed for shortness of breath. 09/10/22  Yes Laurey Morale, MD  albuterol (VENTOLIN HFA) 108 (90 Base) MCG/ACT inhaler Inhale 2 puffs into the lungs every 6 (six) hours as needed for wheezing or shortness of breath.   Yes [provider]  allopurinol (ZYLOPRIM) 100 MG tablet Take 1 tablet (100 mg total) by mouth daily. 12/04/21  Yes Laurey Morale, MD  amLODipine (NORVASC) 10 MG tablet Take 1 tablet (10 mg total) by mouth daily. 08/16/23  Yes Andrey Farmer, PA-C  ciprofloxacin (CIPRO) 750 MG tablet Take 1 tablet (750 mg total) by mouth 2 (two) times daily. 09/10/23  Yes Sabharwal, Aditya, DO  colchicine 0.6 MG tablet Take 1 tablet (0.6 mg total) by mouth daily as needed (gout pain). Patient taking differently: Take 0.6 mg by mouth daily. 04/14/23  Yes Laurey Morale, MD  COMBIVENT RESPIMAT 20-100 MCG/ACT AERS respimat Inhale 1 puff into the lungs every 6 (six) hours. 09/10/22  Yes [provider]  fluticasone (FLONASE ALLERGY RELIEF) 50 MCG/ACT nasal spray Place 2 sprays into both nostrils as needed for allergies or rhinitis.   Yes [provider]  gabapentin (NEURONTIN) 300 MG capsule Take 1 capsule (300 mg total) by mouth 3 (three) times daily. 08/15/23  Yes Andrey Farmer, PA-C  glipiZIDE (GLUCOTROL) 5 MG tablet Take 1 tablet (5 mg total) by mouth daily. 04/14/23 04/13/24 Yes Laurey Morale, MD  guaifenesin (HUMIBID E) 400 MG TABS tablet Take 400 mg by mouth every 6 (six) hours as needed (for congestion).   Yes [provider]  loratadine (CLARITIN) 10 MG tablet Take 1 tablet (10 mg total) by mouth daily. 04/14/23  Yes Laurey Morale, MD  losartan (COZAAR) 50 MG tablet Take 1 tablet (50 mg total) by mouth daily. 08/16/23  Yes Andrey Farmer, PA-C  metFORMIN (GLUCOPHAGE) 1000 MG tablet Take 1  tablet (1,000 mg total) by mouth 2 (two) times daily. 04/14/23  Yes Laurey Morale, MD  metoprolol succinate (TOPROL XL) 25 MG 24 hr tablet Take 1 tablet (25 mg total) by mouth daily. 08/16/23  Yes Andrey Farmer, PA-C  minocycline (MINOCIN) 100 MG capsule Take 2 capsules (200 mg total) by mouth 2 (two) times daily. 09/10/23  Yes Sabharwal, Aditya, DO  mirtazapine (REMERON) 15 MG tablet Take 1 tablet (15 mg total) by mouth at bedtime. 04/14/23  Yes Laurey Morale, MD  Multiple Vitamin (MULTIVITAMIN WITH MINERALS) TABS tablet Take 1 tablet by mouth daily. 02/20/23  Yes Alen Bleacher, NP  Oxycodone HCl 10 MG TABS Take 1 tablet (10 mg total) by mouth every 6 (six) hours as needed. 08/15/23  Yes Andrey Farmer, PA-C  pantoprazole (PROTONIX) 40 MG tablet Take 1 tablet (40 mg total) by mouth daily. 04/14/23  Yes Laurey Morale, MD  rosuvastatin (CRESTOR) 20 MG tablet Take 1 tablet (20 mg total) by mouth daily. 08/15/23  Yes Andrey Farmer, PA-C  sertraline (ZOLOFT) 50 MG tablet Take 1 tablet (50 mg total) by mouth daily. Patient taking differently: Take 50 mg by mouth at bedtime. 04/14/23  Yes Laurey Morale, MD  spironolactone (ALDACTONE) 25 MG tablet Take 0.5 tablets (12.5 mg total) by mouth daily. Patient taking differently: Take 25 mg by mouth daily. 08/16/23  Yes Andrey Farmer, PA-C  tamsulosin (FLOMAX) 0.4 MG CAPS capsule Take 1 capsule (0.4 mg total) by mouth daily. 04/14/23  Yes Laurey Morale, MD  thiamine (VITAMIN B-1) 100 MG tablet Take 1 tablet (100 mg total) by mouth daily. 02/20/23   Alen Bleacher, NP  traMADol (ULTRAM) 50 MG tablet Take 2 tablets (100 mg total) by mouth every 12 (twelve) hours as needed. 08/15/23   Andrey Farmer, PA-C  traZODone (DESYREL) 150 MG tablet Take 1 tablet (150 mg total) by mouth at bedtime. Patient not taking: Reported on 08/26/2023 03/11/22   Laurey Morale, MD  TRELEGY ELLIPTA 100-62.5-25 MCG/ACT AEPB Inhale 1 puff into the lungs daily.  04/14/23   Laurey Morale, MD  warfarin (COUMADIN) 4 MG tablet Take 1-1.5 tablets (4-6 mg total) by mouth See admin instructions. Take 1 tablet (4 mg) every Tuesday and Thursday, Take 1.5 tabs (6 mg) all other days 08/15/23   Andrey Farmer, PA-C    Past Medical History: Past Medical History:  Diagnosis Date   "    Arthritis    CAD (coronary artery disease)    a. s/p CABG in 11/2019 with LIMA-LAD, SVG-OM1, SVG-PDA and SVG-D1   CHF (congestive heart failure) (HCC)    a. EF < 20% by echo in 11/2019   Essential hypertension    PAF (paroxysmal atrial fibrillation) (HCC)    Type 2 diabetes mellitus (HCC)     Past Surgical History: Past Surgical History:  Procedure Laterality Date   APPLICATION OF WOUND VAC Left 06/14/2022   Procedure: APPLICATION OF WOUND VAC;  Surgeon: Lovett Sox, MD;  Location: [facility];  Service: Thoracic;  Laterality: Left;   APPLICATION OF WOUND VAC N/A 06/20/2022   Procedure: VAD POWERCORD TUNNEL WOUND VAC CHANGE;  Surgeon: Lovett Sox, MD;  Location: [facility];  Service: Thoracic;  Laterality: N/A;   APPLICATION OF WOUND VAC N/A 06/27/2022   Procedure: WOUND VAC CHANGE;  Surgeon: Lovett Sox, MD;  Location: [facility];  Service: Thoracic;  Laterality: N/A;   APPLICATION OF WOUND VAC N/A 07/08/2022   Procedure: WOUND VAC CHANGE;  Surgeon: Lovett Sox, MD;  Location: [facility];  Service: Thoracic;  Laterality: N/A;   APPLICATION OF WOUND VAC N/A 01/28/2023   Procedure: APPLICATION OF WOUND VAC;  Surgeon: Lovett Sox, MD;  [facility]: [facility];  Service: Thoracic;  Laterality: N/A;   APPLICATION OF WOUND VAC N/A 02/05/2023   Procedure: WOUND VAC CHANGE;  Surgeon: Lovett Sox, MD;  [facility];  Service: Thoracic;  Laterality: N/A;   APPLICATION OF WOUND VAC N/A 02/12/2023   Procedure: REMOVAL OF WOUND VAC DRESSING;  Surgeon: Lovett Sox, MD;  [facility]: [facility];  Service: Thoracic;  Laterality: N/A;   APPLICATION OF WOUND VAC N/A 07/09/2023   Procedure:  APPLICATION OF WOUND VAC;  Surgeon: Lovett Sox, MD;  [facility]: [facility];  Service: Thoracic;  Laterality: N/A;   BACK SURGERY     CLIPPING OF ATRIAL APPENDAGE N/A 11/26/2019   Procedure: Clipping Of Atrial Appendage using AtriCure 40 Clip;  Surgeon: [name], MD;  [facility];  Service: Open Heart Surgery;  Laterality: N/A;   CORONARY ARTERY BYPASS GRAFT N/A 11/26/2019   Procedure: CORONARY ARTERY BYPASS GRAFTING (CABG) using endoscopic greater saphenous vein harvest: svc to OM; svc to Diag; svc to PD; and LIMA to LAD.;  Surgeon: [name], MD;  Location: [facility];  Service: Open Heart Surgery;  Laterality: N/A;   FOOT SURGERY     HAND SURGERY     INSERTION OF IMPLANTABLE LEFT VENTRICULAR ASSIST DEVICE N/A 11/17/2020   Procedure: INSERTION OF IMPLANTABLE LEFT VENTRICULAR ASSIST DEVICE - HM3;  Surgeon: [name], MD;  [facility]: [facility];  Service: Open Heart Surgery;  Laterality: N/A;   INTRAOPERATIVE TRANSESOPHAGEAL ECHOCARDIOGRAM  12/04/2019   Procedure: Intraoperative Transesophageal Echocardiogram;  Surgeon: [name], MD;  [facility]: [facility];  Service: Open Heart Surgery;;   MAZE N/A 11/26/2019   Procedure: MAZE using Bilateral Pulmonary Vein isolation.;  Surgeon: [name], MD;  [facility];  Service: Open Heart Surgery;  Laterality: N/A;   MULTIPLE EXTRACTIONS WITH ALVEOLOPLASTY N/A 11/16/2020   Procedure: MULTIPLE EXTRACTION WITH ALVEOLOPLASTY;  Surgeon: Sharman Cheek, DMD;  [facility]: [facility];  Service: Dentistry;  Laterality: N/A;   PLACEMENT OF IMPELLA LEFT VENTRICULAR ASSIST DEVICE N/A 11/24/2019   Procedure: PLACEMENT OF IMPELLA 5.5 LEFT VENTRICULAR ASSIST DEVICE;  Surgeon: [name], MD;  [facility];  Service: Open Heart Surgery;  Laterality: N/A;   PLACEMENT OF IMPELLA LEFT VENTRICULAR ASSIST DEVICE Left 11/10/2020   Procedure: PLACEMENT OF IMPELLA 5.5 LEFT VENTRICULAR ASSIST DEVICE VIA  LEFT AVILLARY ARTERY;  Surgeon: [name], MD;  [facility];  Service: Open Heart Surgery;  Laterality: Left;   REMOVAL OF IMPELLA LEFT VENTRICULAR ASSIST DEVICE Right 12/04/2019   Procedure: REMOVAL OF IMPELLA LEFT VENTRICULAR ASSIST DEVICE, right axilla;  Surgeon: [name], MD;  Location: [facility];  Service: Open Heart Surgery;  Laterality: Right;   RIGHT/LEFT HEART CATH AND CORONARY ANGIOGRAPHY N/A 11/24/2019   Procedure:  RIGHT/LEFT HEART CATH AND CORONARY ANGIOGRAPHY;  Surgeon: Laurey Morale, MD;  Location: [facility] INVASIVE CV LAB;  Service: Cardiovascular;  Laterality: N/A;   RIGHT/LEFT HEART CATH AND CORONARY/GRAFT ANGIOGRAPHY N/A 11/09/2020   Procedure: RIGHT/LEFT HEART CATH AND CORONARY/GRAFT ANGIOGRAPHY;  Surgeon: [name], MD;  [facility]: [facility] INVASIVE [facility];  Service: Cardiovascular;  Laterality: N/A;   STERNAL WOUND DEBRIDEMENT N/A 05/16/2022   Procedure: VAD DRIVELINE DEBRIDEMENT;  Surgeon: Lovett Sox, MD;  [facility]: [facility];  Service: Thoracic;  Laterality: N/A;  VANC PULSE LAVAGE   STERNAL WOUND DEBRIDEMENT Left 06/14/2022   Procedure: DEBRIDEMENT OF VAD POWERCORD TUNNEL;  Surgeon: Lovett Sox, MD;  [facility];  Service: Thoracic;  Laterality: Left;   STERNAL WOUND DEBRIDEMENT N/A 06/27/2022   Procedure: ABDOMINAL WOUND DEBRIDEMENT, APPLICATION OF MYRIAD;  Surgeon: Lovett Sox, MD;  [facility];  Service: Thoracic;  Laterality: N/A;   STERNAL WOUND DEBRIDEMENT N/A 07/08/2022   Procedure: ABDOMINAL WOUND DEBRIDEMENT;  Surgeon: Lovett Sox, MD;  [facility];  Service: Thoracic;  Laterality: N/A;   STERNAL WOUND DEBRIDEMENT N/A 01/28/2023   Procedure: ABDOMINAL DRIVELINE WOUND DEBRIDEMENT;  Surgeon: Lovett Sox, MD;  [facility];  Service: Thoracic;  Laterality: N/A;   STERNAL WOUND DEBRIDEMENT N/A 02/05/2023   Procedure: ABDOMINAL DRIVELINE WOUND DEBRIDEMENT;  Surgeon: Lovett Sox, MD;  [facility];  Service: Thoracic;  Laterality: N/A;   STERNAL WOUND DEBRIDEMENT N/A  02/12/2023   Procedure: ABDOMINAL DRIVELINE WOUND DEBRIDEMENT AND APPLICATION OF MATRIX WOUND DRESSING;  Surgeon: Lovett Sox, MD;  [facility];  Service: Thoracic;  Laterality: N/A;   STERNAL WOUND DEBRIDEMENT N/A 07/09/2023   Procedure: DRIVELINE WOUND DEBRIDEMENT;  Surgeon: Lovett Sox, MD;  [facility];  Service: Thoracic;  Laterality: N/A;   STERNAL WOUND DEBRIDEMENT N/A 07/17/2023   Procedure: DRIVELINE WOUND DEBRIDEMENT;  Surgeon: Lovett Sox, MD;  [facility];  Service: Thoracic;  Laterality: N/A;   TEE WITHOUT CARDIOVERSION N/A 11/24/2019   Procedure: TRANSESOPHAGEAL ECHOCARDIOGRAM (TEE);  Surgeon: [name], MD;  Location: [facility] OR;  Service: Open Heart Surgery;  Laterality: N/A;   TEE WITHOUT CARDIOVERSION N/A 11/26/2019   Procedure: TRANSESOPHAGEAL ECHOCARDIOGRAM (TEE);  Surgeon: [name], MD;  Location: [facility] OR;  Service: Open Heart Surgery;  Laterality: N/A;   TEE WITHOUT CARDIOVERSION N/A 11/10/2020   Procedure: TRANSESOPHAGEAL ECHOCARDIOGRAM (TEE);  Surgeon: [name], MD;  Location[facility] OR;  Service: Open Heart Surgery;  Laterality: N/A;   TEE WITHOUT CARDIOVERSION N/A 11/17/2020   Procedure: TRANSESOPHAGEAL ECHOCARDIOGRAM (TEE);  Surgeon: [name], MD;  Location: [facility] OR;  Service: Open Heart Surgery;  Laterality: N/A;   WOUND EXPLORATION N/A 05/31/2022   Procedure: WOUND EXPLORATION, Debridement and Irrigation of abdominal wound;  Surgeon: Lovett Sox, MD;  [facility];  Service: Thoracic;  Laterality: N/A;    Family History: Family History  Problem Relation Age of Onset   Heart disease Sister        Tumor?   Arthritis Other    Lung disease Other    Asthma Other    Diabetes Other    Thyroid disease Neg Hx     Social History: Social History   Socioeconomic History   Marital status: Married    Spouse name: Not on file   Number of children: Not on file   Years of education: 12   Highest education level: Not on  file  Occupational History   Not on file  Tobacco Use  Smoking status: Some Days    Current packs/day: 0.00    Average packs/day: 1 pack/day for 30.0 years (30.0 ttl pk-yrs)    Types: Cigarettes    Start date: 12/19/1989    Last attempt to quit: 12/20/2019    Years since quitting: 3.7   Smokeless tobacco: Never  Vaping Use   Vaping status: Never Used  Substance and Sexual Activity   Alcohol use: Not Currently    Comment: Prior history of excessive intake   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 He denies any issues with walking or talking as a child. He completed the 9th grade. He is currently financially supported through disability. He is married and he has four children he is estranged from. He is currently living with his wife.    Social Determinants of Health   Financial Resource Strain: Medium Risk (05/17/2022)   Overall Financial Resource Strain (CARDIA)    Difficulty of Paying Living Expenses: Somewhat hard  Food Insecurity: No Food Insecurity (09/08/2023)   Hunger Vital Sign    Worried About Running Out of Food in the Last Year: Never true    Ran Out of Food in the Last Year: Never true  Transportation Needs: Unmet Transportation Needs (09/08/2023)   PRAPARE - Administrator, Civil Service (Medical): Yes    Lack of Transportation (Non-Medical): Yes  Physical Activity: Inactive (11/10/2020)   Exercise Vital Sign    Days of Exercise per Week: 0 days    Minutes of Exercise per Session: 0 min  Stress: Not on file  Social Connections: Not on file    Allergies:  Allergies  Allergen Reactions   Firvanq [Vancomycin] Nausea And Vomiting and Other (See Comments)    Arthralgias     Objective:    Vital Signs:   Temp:  [98.1 F (36.7 C)] 98.1 F (36.7 C) (10/15 1239) Pulse Rate:  [103-106] 106 (10/15  1215) Resp:  [22-28] 28 (10/15 1215) BP: (145-155)/(93-122) 155/93 (10/15 1215) SpO2:  [100 %] 100 % (10/15 1215) Weight:  [86.2 kg] 86.2 kg (10/15 1201)   Filed Weights   09/23/23 1201  Weight: 86.2 kg    Mean arterial Pressure 130s  Physical Exam    General: Appears uncomfortable HEENT: Normal Neck: supple. JVP not elevated.  Cor: Mechanical heart sounds with LVAD hum present. Lungs: Clear Abdomen: soft, nondistended, guarding when attempted to palpate Driveline: C/D/I; securement device intact and driveline incorporated Extremities: no cyanosis, clubbing, rash, edema Neuro: alert & orientedx3. Affect anxious.   EKG   Not yet completed  Labs    Basic Metabolic Panel: Recent Labs  Lab 09/23/23 1219  NA 138  K 3.5  CL 102  GLUCOSE 196*  BUN 20  CREATININE 0.80    Liver Function Tests: No results for input(s): "AST", "ALT", "ALKPHOS", "BILITOT", "PROT", "ALBUMIN" in the last 168 hours. No results for input(s): "LIPASE", "AMYLASE" in the last 168 hours. No results for input(s): "AMMONIA" in the last 168 hours.  CBC: Recent Labs  Lab 09/23/23 1219  HGB 11.9*  HCT 35.0*    Cardiac Enzymes: No results for input(s): "CKTOTAL", "CKMB", "CKMBINDEX", "TROPONINI" in the last 168 hours.  BNP: BNP (last 3 results) Recent Labs    09/08/23 1940  BNP 57.1    ProBNP (last 3  results) No results for input(s): "PROBNP" in the last 8760 hours.   CBG: No results for input(s): "GLUCAP" in the last 168 hours.  Coagulation Studies: No results for input(s): "LABPROT", "INR" in the last 72 hours.  Other results:.   Imaging    No results found.    Patient Profile:   62 y.o. male with history of CAD s/p CABG, chronic systolic CHF s/p HM III VAD, chronic driveline infection, DM II, COPD, HTN, large abdominal hernia.  Presenting with severe abdominal pain X 2-3 days. Admitted for further workup and pain control.  Assessment/Plan:    Abdominal  pain: -Severe X 2-3 days. Associated with nausea and vomiting -Etiology not certain. Does have large ventral hernia and chronic driveline infection. Obtain STAT CT A/P with contrast. -Given 50 mcg fentanyl in ED. Ordered 0.5 mg dilaudid Q 4hrs + Zofran. -Check CBC, CMET, lipase, lactic acid, UDS, INR, LDH  2. 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  Cultures have grown acinetobacter, MRSA and pseudomonas He was readmitted with driveline infection in 2/24, completed course of daptomycin for MRSA.  Discharged from the hospital on 09/04/2023 with recurrent driveline infection growing Pseudomonas.   On chronic minocycline and ciprofloxacin at home.  Will resume home antibiotics pending further workup - may need IV abx if he has an acute abdominal infection   2.  Chronic systolic CHF s/p Heartmate 3 LVAD on 11/17/20 - Not volume overloaded. Poor po intake last few days. Flows in low 2s while in ED - Give 500 mL bolus LR - On warfarin, INR goal 2-2.5.  - INR pending.    3. 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  - Stable.     4.  CAD S/p CABG 12/20 - Continue Crestor 20 mg daily.    5.  Paroxysmal atrial fibrillation status post maze and left atrial appendage clip - Check ECG and place on telemetry - Off amiodarone with hyperthyroidism.   6. Type 2 diabetes -SSI   7. Hx methamphetamine abuse:  - Check UDS   8. 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  Followed by endocrinology. He is now off amiodarone.  - Continue methimazole     9. Anemia (chronic blood loss):  - Check Hgb   10. COPD: Prior smoker.  - Continue Trelegy.    11. PVCs/NSVT: Now off amiodarone. Restart metoprolol when more stable    I reviewed the LVAD parameters from today, and compared the results to the patient's prior recorded data.  No  programming changes were made.  The LVAD is functioning within specified parameters.  The patient performs LVAD self-test daily.  LVAD interrogation was negative for any significant power changes, alarms or PI events/speed drops.  LVAD equipment check completed and is in good working order.  Back-up equipment present.   LVAD education done on emergency procedures and precautions and reviewed exit site care.  Length of Stay: 0  [facility], Dalbert Garnet, PA-C 09/23/2023, 12:46 PM  VAD Team Pager [phone] (7am - 7am) +++VAD ISSUES ONLY+++   Advanced Heart Failure Team Pager [phone] (M-F; 7a - 5p)  Please contact [facility] for night-coverage after hours (5p -7a ) and weekends on [email or web address] for all non- LVAD Issues   Patient seen with PA, agree with the above note.   Patient was recently admitted in 9/24 with probable viral URI, no significant  changes to therapy.  He has been on treament with ciprofloxacin and minocycline for a chronic driveline infection. He has a large abdominal wall hernia in close proximity to the driveline.  Given close proximity to driveline, he has been thought to be a very poor surgical candidate for this.  He has not had much pain at the site in recent weeks until yesterday.  He has had aching pain at his hernia site starting yesterday.  His abdomen is not tight and the hernia is not particularly tender.  He had a small BM this morning.  He has had nausea and "dry heaving."  He is currently writhing in pain on his stretcher.    INR 3.1, LDH 181, hgb 13.8, WBCs 16, K 4.3, creatinine 1.11  General: Well appearing this am. NAD.  HEENT: Normal. Neck: Supple, JVP 7-8 cm. Carotids OK.  Cardiac:  Mechanical heart sounds with LVAD hum present.  Lungs:  CTAB, normal effort.  Abdomen: Large abdominal wall hernia near driveline exit site, mild tenderness.  Abdomen is soft.  Decreased breath sounds.  LVAD exit site: Well-healed and incorporated. Dressing dry and intact. No erythema  or drainage. Stabilization device present and accurately applied. Driveline dressing changed daily per sterile technique. Extremities:  Warm and dry. No cyanosis, clubbing, rash, or edema.  Neuro:  Alert & oriented x 3. Cranial nerves grossly intact. Moves all 4 extremities w/o difficulty. Affect pleasant    Patient has severe abdominal pain that he localizes to the area of his large abdominal wall hernia.  Mild tenderness and minimal distention.  However, he is writhing in pain with nausea.  He did have a small BM today.  - Needs CT abdomen/pelvis to assess for evidence for bowel obstruction.  - Pain medications and Zofran ordered.  - He would be a poor surgical candidate with LVAD and driveline in close proximity to hernia.  - In addition to labs already obtained, will need lipase, LFTs, lactate.   H/o amphetamine abuse, will check UDS.   With chronic driveline infection, will continue ciprofloxacin and minocycline for now, but if not able to take po, will have to transition to IV abx.   LVAD flow low in 3's and has dipped to 2's in setting of poor po intake.   - Agree with 500 cc LR bolus.   Marca Ancona 09/23/2023 2:05 PM

## 2023-09-23 NOTE — ED Notes (Signed)
CRITICAL VALUE STICKER  CRITICAL VALUE:lactic acid 3.6  RECEIVER (on-site recipient of call):I. Beckett Hickmon  DATE & TIME NOTIFIED: 09/23/23 1426  MESSENGER (representative from lab):Lab  MD NOTIFIED: Shirlee Latch  TIME OF NOTIFICATION:1427  RESPONSE:

## 2023-09-23 NOTE — Progress Notes (Addendum)
Patient presents for sick visit in VAD clinic via wheelchair with wife Kevin Underwood. Denies any issues with VAD equipment or drive line.   Pt reports severe abdominal pain at hernia site starting 2-3 days ago that increased in intensity overnight. Pt unable to eat or drink anything since last night. Reports overall compliance with medication but was unable to take medications last night or this morning due to abdominal pain and constant nausea. Reports abdominal pain at a 10/10. Pt actively dry heaving in clinic. Pt reports taking stool softener yesterday with small bowel movement.   Pt and wife report compliance with Minocycline 200 mg BID and Cipro 750 mg BID.Kevin Underwood reports she is changing dressing once (sometimes twice per pt preference) daily. VAD Coordinator changed dressing today. See details below.  Discussed with Dr. Shirlee Latch will take pt to ED while waiting on open bed on 2C for pain control and CT scan. Patient placement called for admission to [facility] once bed available.  Vital Signs:  Temp: 97.5 Doppler Pressure: 162 Automatic BP: 139/108 (118) Repeat: 152/101 (115) HR: 99 SR SPO2: 99%    Weight: 190.6 lb w/ ept Discharge weight:195 lbs Last weight: 211.8 lb w/ ept  VAD Indication: Destination Therapy. HM III implanted 11/17/20 by Dr Vickey Sages   VAD interrogation: Speed: 5600 Flow: 3.5 Power: 4.6 w    PI: 2.1 Hct: 31  Alarms: none  Events: 100+  Fixed speed: 5600 Low speed limit: 5300  Primary controller: replace back up battery in 30 months Back up controller: replace back up battery in 17 months--did not bring   I reviewed the LVAD parameters from today and compared the results to the patient's prior recorded data. LVAD interrogation was NEGATIVE for significant power changes, NEGATIVE for clinical alarms and STABLE for PI events/speed drops. No programming changes were made and pump is functioning within specified parameters. Pt is performing daily controller and system monitor  self tests along with completing weekly and monthly maintenance for LVAD equipment.   LVAD equipment check completed and is in good working order. Back-up equipment not present.    Annual Equipment Maintenance on UBC/PM was performed today on 01/15/2023.  Exit Site Care:  Existing VAD dressing removed and site care performed using sterile technique. Wound bed cleansed with VASHE solution and sterile 4x4 gauze through light manual debridement. Skin surrounding wound bed cleaned with Chlora prep applicators x 2, allowed to dry. VASHE moistened 2x2 packed into wound bed. Followed by VASHE soaked 2x2 flush to exit site and then covered with several dry 4 x 4s. Wound tunnels approx 3 cm.  Drive line partially incorporated. Small amount of thick yellow/serosanguinous drainage noted. No redness, tenderness, foul odor or rash noted.  Drive line anchor reapplied. Continue daily wet to dry dressing changes using VASHE solution per bedside RN or VAD coordinator. Next dressing change due 09/24/23.        BP & Labs:  Doppler 162- Doppler reflecting modified systolic   Hgb 13.8 No S/S of bleeding. Specifically denies melena/BRBPR or nosebleeds.    LDH 181-  with established baseline of 190 - 360. Denies tea-colored urine. No power elevations noted on interrogation.    Plan: Pt taken to Emergency Department by this VAD Coordinator for pain control prior to admission  Simmie Davies RN,BSN VAD Coordinator  Office: [phone]  24/7 Pager: [phone]   Patient admitted from office due to concern for bowel obstruction with severe abdominal pain.  Please see admission H&P for details.   Kevin Underwood [employer]  09/23/2023 2:07 PM

## 2023-09-23 NOTE — ED Notes (Signed)
Manual Map: 132

## 2023-09-23 NOTE — Progress Notes (Addendum)
PHARMACY - ANTICOAGULATION CONSULT NOTE  Pharmacy Consult for warfarin Indication:  HM3 LVAD  Allergies  Allergen Reactions   Firvanq [Vancomycin] Nausea And Vomiting and Other (See Comments)    Arthralgias     Patient Measurements: Height: 5\' 10"  (177.8 cm) Weight: 86.2 kg (190 lb) IBW/kg (Calculated) : 73  Vital Signs: Temp: 98.1 F (36.7 C) (10/15 1239) Temp Source: Oral (10/15 1239) BP: 146/89 (10/15 1500) Pulse Rate: 99 (10/15 1500)  Labs: Recent Labs    09/23/23 1055 09/23/23 1219  HGB 13.8 11.9*  HCT 40.7 35.0*  PLT 378  --   LABPROT 31.9*  --   INR 3.1*  --   CREATININE 1.11 0.80    Estimated Creatinine Clearance: 98.9 mL/min (by C-G formula based on SCr of 0.8 mg/dL).   Medical History: Past Medical History:  Diagnosis Date   "    Arthritis    CAD (coronary artery disease)    a. s/p CABG in 11/2019 with LIMA-LAD, SVG-OM1, SVG-PDA and SVG-D1   CHF (congestive heart failure) (HCC)    a. EF < 20% by echo in 11/2019   Essential hypertension    PAF (paroxysmal atrial fibrillation) (HCC)    Type 2 diabetes mellitus (HCC)     Medications:  (Not in a hospital admission)  Scheduled:   Assessment: Mr. Kevin Underwood is a 62YOM who was admitted for abdominal pain and pain control. Patient has a HM3 LVAD and on warfarin PTA. INR 3.1, supratherapeutic on admission.   Warfarin PTA regimen: 4 mg T/Th and 6 mg EOD Last dose PTA: 10/14 AM  Goal of Therapy:  INR 2.0-2.5 Monitor platelets by anticoagulation protocol: Yes   Plan:  Holding warfarin today Monitor INR, CBC, and bleeding daily  Enos Fling, PharmD PGY-1 Acute Care Pharmacy Resident 09/23/2023 3:58 PM

## 2023-09-23 NOTE — ED Triage Notes (Signed)
Patient arrives from VAD clinic for abdominal pain, N/V/D x2 days, patient endorses chills and SHOB. Hx of abd hernia and patient has obviously distended abdomen. Patient reports 10/10 pain to the hernia site. Plan from VAD clinic is for CT and admission.

## 2023-09-23 NOTE — ED Notes (Signed)
ED TO INPATIENT HANDOFF REPORT  ED Nurse Name and Phone #: Marcello Moores [phone]  S Name/Age/Gender Kevin Underwood. 62 y.o. male Room/Bed: 023C/023C  Code Status   Code Status: Partial Code  Home/SNF/Other Home Patient oriented to: self, place, time, and situation Is this baseline? Yes   Triage Complete: Triage complete  Chief Complaint Abdominal pain [R10.9]  Triage Note Patient arrives from VAD clinic for abdominal pain, N/V/D x2 days, patient endorses chills and SHOB. Hx of abd hernia and patient has obviously distended abdomen. Patient reports 10/10 pain to the hernia site. Plan from VAD clinic is for CT and admission.    Allergies Allergies  Allergen Reactions   Firvanq [Vancomycin] Nausea And Vomiting and Other (See Comments)    Arthralgias     Level of Care/Admitting Diagnosis ED Disposition     ED Disposition  Admit   Condition  --   Comment  Hospital Area: [facility] [[id number]]  Level of Care: Progressive [102]  Admit to Progressive based on following criteria: CARDIOVASCULAR & THORACIC of moderate stability with acute coronary syndrome symptoms/low risk myocardial infarction/hypertensive urgency/arrhythmias/heart failure potentially compromising stability and stable post cardiovascular intervention patients.  May admit patient to [facility] or [facility] if equivalent level of care is available:: No  Covid Evaluation: Asymptomatic - no recent exposure (last 10 days) testing not required  Diagnosis: Abdominal pain [557322]  Admitting Physician: Laurey Morale [[id number]]  Attending Physician: Laurey Morale [phone]  Certification:: I certify this patient will need inpatient services for at least 2 midnights  Expected Medical Readiness: 09/26/2023          B Medical/Surgery History Past Medical History:  Diagnosis Date   "    Arthritis    CAD (coronary artery disease)    a. s/p CABG in 11/2019 with LIMA-LAD, SVG-OM1, SVG-PDA and  SVG-D1   CHF (congestive heart failure) (HCC)    a. EF < 20% by echo in 11/2019   Essential hypertension    PAF (paroxysmal atrial fibrillation) (HCC)    Type 2 diabetes mellitus (HCC)    Past Surgical History:  Procedure Laterality Date   APPLICATION OF WOUND VAC Left 06/14/2022   Procedure: APPLICATION OF WOUND VAC;  Surgeon: Lovett Sox, MD;  Location: [facility] OR;  Service: Thoracic;  Laterality: Left;   APPLICATION OF WOUND VAC N/A 06/20/2022   Procedure: VAD POWERCORD TUNNEL WOUND VAC CHANGE;  Surgeon: Lovett Sox, MD;  Location: [facility] OR;  Service: Thoracic;  Laterality: N/A;   APPLICATION OF WOUND VAC N/A 06/27/2022   Procedure: WOUND VAC CHANGE;  Surgeon: Lovett Sox, MD;  Location: [facility];  Service: Thoracic;  Laterality: N/A;   APPLICATION OF WOUND VAC N/A 07/08/2022   Procedure: WOUND VAC CHANGE;  Surgeon: Lovett Sox, MD;  Location: [facility];  Service: Thoracic;  Laterality: N/A;   APPLICATION OF WOUND VAC N/A 01/28/2023   Procedure: APPLICATION OF WOUND VAC;  Surgeon: Lovett Sox, MD;  [facility]: [facility];  Service: Thoracic;  Laterality: N/A;   APPLICATION OF WOUND VAC N/A 02/05/2023   Procedure: WOUND VAC CHANGE;  Surgeon: Lovett Sox, MD;  [facility]: [facility];  Service: Thoracic;  Laterality: N/A;   APPLICATION OF WOUND VAC N/A 02/12/2023   Procedure: REMOVAL OF WOUND VAC DRESSING;  Surgeon: Lovett Sox, MD;  [facility]: [facility];  Service: Thoracic;  Laterality: N/A;   APPLICATION OF WOUND VAC N/A 07/09/2023   Procedure: APPLICATION OF WOUND VAC;  Surgeon: Lovett Sox, MD;  [facility]: [facility];  Service: Thoracic;  Laterality: N/A;   BACK SURGERY     CLIPPING OF ATRIAL APPENDAGE N/A 11/26/2019   Procedure: Clipping Of Atrial Appendage using AtriCure 40 Clip;  Surgeon: [name], MD;  Location: [facility];  Service: Open Heart Surgery;  Laterality: N/A;   CORONARY ARTERY BYPASS GRAFT N/A 11/26/2019   Procedure: CORONARY ARTERY BYPASS GRAFTING (CABG) using endoscopic greater  saphenous vein harvest: svc to OM; svc to Diag; svc to PD; and LIMA to LAD.;  Surgeon: [name], MD;  Location: [facility];  Service: Open Heart Surgery;  Laterality: N/A;   FOOT SURGERY     HAND SURGERY     INSERTION OF IMPLANTABLE LEFT VENTRICULAR ASSIST DEVICE N/A 11/17/2020   Procedure: INSERTION OF IMPLANTABLE LEFT VENTRICULAR ASSIST DEVICE - HM3;  Surgeon: [name], MD;  [facility]: [facility];  Service: Open Heart Surgery;  Laterality: N/A;   INTRAOPERATIVE TRANSESOPHAGEAL ECHOCARDIOGRAM  12/04/2019   Procedure: Intraoperative Transesophageal Echocardiogram;  Surgeon: [name], MD;  [facility]: [facility];  Service: Open Heart Surgery;;   MAZE N/A 11/26/2019   Procedure: MAZE using Bilateral Pulmonary Vein isolation.;  Surgeon: [name], MD;  [facility]: [facility];  Service: Open Heart Surgery;  Laterality: N/A;   MULTIPLE EXTRACTIONS WITH ALVEOLOPLASTY N/A 11/16/2020   Procedure: MULTIPLE EXTRACTION WITH ALVEOLOPLASTY;  Surgeon: Sharman Cheek, DMD;  Location: [facility];  Service: Dentistry;  Laterality: N/A;   PLACEMENT OF IMPELLA LEFT VENTRICULAR ASSIST DEVICE N/A 11/24/2019   Procedure: PLACEMENT OF IMPELLA 5.5 LEFT VENTRICULAR ASSIST DEVICE;  Surgeon: [name], MD;  [facility];  Service: Open Heart Surgery;  Laterality: N/A;   PLACEMENT OF IMPELLA LEFT VENTRICULAR ASSIST DEVICE Left 11/10/2020   Procedure: PLACEMENT OF IMPELLA 5.5 LEFT VENTRICULAR ASSIST DEVICE VIA  LEFT AVILLARY ARTERY;  Surgeon: [name], MD;  [facility]: [facility];  Service: Open Heart Surgery;  Laterality: Left;   REMOVAL OF IMPELLA LEFT VENTRICULAR ASSIST DEVICE Right 12/04/2019   Procedure: REMOVAL OF IMPELLA LEFT VENTRICULAR ASSIST DEVICE, right axilla;  Surgeon: [name], MD;  Location: [facility] OR;  Service: Open Heart Surgery;  Laterality: Right;   RIGHT/LEFT HEART CATH AND CORONARY ANGIOGRAPHY N/A 11/24/2019   Procedure: RIGHT/LEFT HEART CATH AND CORONARY ANGIOGRAPHY;  Surgeon:  Laurey Morale, MD;  Location: [facility] INVASIVE CV LAB;  Service: Cardiovascular;  Laterality: N/A;   RIGHT/LEFT HEART CATH AND CORONARY/GRAFT ANGIOGRAPHY N/A 11/09/2020   Procedure: RIGHT/LEFT HEART CATH AND CORONARY/GRAFT ANGIOGRAPHY;  Surgeon: [name], MD;  Location: [facility] INVASIVE CV LAB;  Service: Cardiovascular;  Laterality: N/A;   STERNAL WOUND DEBRIDEMENT N/A 05/16/2022   Procedure: VAD DRIVELINE DEBRIDEMENT;  Surgeon: Lovett Sox, MD;  [facility]: [facility];  Service: Thoracic;  Laterality: N/A;  VANC PULSE LAVAGE   STERNAL WOUND DEBRIDEMENT Left 06/14/2022   Procedure: DEBRIDEMENT OF VAD POWERCORD TUNNEL;  Surgeon: Lovett Sox, MD;  [facility]: [facility];  Service: Thoracic;  Laterality: Left;   STERNAL WOUND DEBRIDEMENT N/A 06/27/2022   Procedure: ABDOMINAL WOUND DEBRIDEMENT, APPLICATION OF MYRIAD;  Surgeon: Lovett Sox, MD;  [facility];  Service: Thoracic;  Laterality: N/A;   STERNAL WOUND DEBRIDEMENT N/A 07/08/2022   Procedure: ABDOMINAL WOUND DEBRIDEMENT;  Surgeon: Lovett Sox, MD;  [facility];  Service: Thoracic;  Laterality: N/A;   STERNAL WOUND DEBRIDEMENT N/A 01/28/2023   Procedure: ABDOMINAL DRIVELINE WOUND DEBRIDEMENT;  Surgeon: Lovett Sox, MD;  [facility];  Service: Thoracic;  Laterality: N/A;  STERNAL WOUND DEBRIDEMENT N/A 02/05/2023   Procedure: ABDOMINAL DRIVELINE WOUND DEBRIDEMENT;  Surgeon: Lovett Sox, MD;  Location[facility] OR;  Service: Thoracic;  Laterality: N/A;   STERNAL WOUND DEBRIDEMENT N/A 02/12/2023   Procedure: ABDOMINAL DRIVELINE WOUND DEBRIDEMENT AND APPLICATION OF MATRIX WOUND DRESSING;  Surgeon: Lovett Sox, MD;  [facility];  Service: Thoracic;  Laterality: N/A;   STERNAL WOUND DEBRIDEMENT N/A 07/09/2023   Procedure: DRIVELINE WOUND DEBRIDEMENT;  Surgeon: Lovett Sox, MD;  [facility];  Service: Thoracic;  Laterality: N/A;   STERNAL WOUND DEBRIDEMENT N/A 07/17/2023   Procedure: DRIVELINE WOUND DEBRIDEMENT;  Surgeon: Lovett Sox, MD;  [facility];  Service: Thoracic;  Laterality: N/A;   TEE WITHOUT CARDIOVERSION N/A 11/24/2019   Procedure: TRANSESOPHAGEAL ECHOCARDIOGRAM (TEE);  Surgeon: [name], MD;  Location: [facility] OR;  Service: Open Heart Surgery;  Laterality: N/A;   TEE WITHOUT CARDIOVERSION N/A 11/26/2019   Procedure: TRANSESOPHAGEAL ECHOCARDIOGRAM (TEE);  Surgeon: [name], MD;  Location: [facility] OR;  Service: Open Heart Surgery;  Laterality: N/A;   TEE WITHOUT CARDIOVERSION N/A 11/10/2020   Procedure: TRANSESOPHAGEAL ECHOCARDIOGRAM (TEE);  Surgeon: [name], MD;  Location: [facility] OR;  Service: Open Heart Surgery;  Laterality: N/A;   TEE WITHOUT CARDIOVERSION N/A 11/17/2020   Procedure: TRANSESOPHAGEAL ECHOCARDIOGRAM (TEE);  Surgeon: [name], MD;  Location: [facility] OR;  Service: Open Heart Surgery;  Laterality: N/A;   WOUND EXPLORATION N/A 05/31/2022   Procedure: WOUND EXPLORATION, Debridement and Irrigation of abdominal wound;  Surgeon: Lovett Sox, MD;  Location: [facility];  Service: Thoracic;  Laterality: N/A;     A IV Location/Drains/Wounds Patient Lines/Drains/Airways Status     Active Line/Drains/Airways     Name Placement date Placement time Site Days   Peripheral IV 09/23/23 20 G Anterior;Distal;Left;Upper Arm 09/23/23  1206  Arm  less than 1            Intake/Output Last 24 hours No intake or output data in the 24 hours ending 09/23/23 1407  Labs/Imaging Results for orders placed or performed during the hospital encounter of 09/23/23 (from the past 48 hour(s))  I-stat chem 8, ED (not at [facility], [facility] or [facility])     Status: Abnormal   Collection Time: 09/23/23 12:19 PM  Result Value Ref Range   Sodium 138 135 - 145 mmol/L   Potassium 3.5 3.5 - 5.1 mmol/L   Chloride 102 98 - 111 mmol/L   BUN 20 8 - 23 mg/dL   Creatinine, Ser 1.61 0.61 - 1.24 mg/dL   Glucose, Bld 096 (H) 70 - 99 mg/dL    Comment: Glucose reference range applies only to samples taken after fasting for  at least 8 hours.   Calcium, Ion 1.03 (L) 1.15 - 1.40 mmol/L   TCO2 21 (L) 22 - 32 mmol/L   Hemoglobin 11.9 (L) 13.0 - 17.0 g/dL   HCT 04.5 (L) 40.9 - 81.1 %   No results found.  Pending Labs Unresulted Labs (From admission, onward)     Start     Ordered   09/24/23 0500  Lactate dehydrogenase  Daily,   R      09/23/23 1237   09/24/23 0500  Protime-INR  Daily,   R      09/23/23 1237   09/24/23 0500  CBC  Daily,   R      09/23/23 1237   09/24/23 0500  Basic metabolic panel  Daily,   R      09/23/23  1237   09/23/23 1330  Rapid urine drug screen (hospital performed)  ONCE - STAT,   STAT        09/23/23 1329   09/23/23 1247  Lactic acid, plasma  (Lactic Acid)  Now then every 2 hours,   R (with TIMED occurrences)      09/23/23 1246   09/23/23 1245  Lipase, blood  Add-on,   AD        09/23/23 1244   09/23/23 1245  Magnesium  Add-on,   AD       Comments: Blood test to be collected in minimum volume tubes    09/23/23 1244   09/23/23 1245  Hepatic function panel  Add-on,   AD        09/23/23 1244   09/23/23 1245  Resp panel by RT-PCR (RSV, Flu A&B, Covid) Anterior Nasal Swab  Once,   R        09/23/23 1244            Vitals/Pain Today's Vitals   09/23/23 1302 09/23/23 1305 09/23/23 1315 09/23/23 1330  BP:  (!) 173/103 (!) 150/88 (!) 154/108  Pulse:  (!) 101 (!) 106 100  Resp:  (!) 22 19 18   Temp:      TempSrc:      SpO2:  100% 100% 100%  Weight:      Height:      PainSc: 9        Isolation Precautions No active isolations  Medications Medications  ondansetron (ZOFRAN) injection 4 mg (4 mg Intravenous Given 09/23/23 1215)  fentaNYL (SUBLIMAZE) injection 50 mcg (50 mcg Intravenous Given 09/23/23 1216)  HYDROmorphone (DILAUDID) injection 0.5 mg (0.5 mg Intravenous Given 09/23/23 1302)  lactated ringers bolus 500 mL (500 mLs Intravenous New Bag/Given 09/23/23 1226)    Mobility walks with person assist     Focused Assessments Cardiac Assessment Handoff:     Lab Results  Component Value Date   CKTOTAL 61 08/12/2023   Lab Results  Component Value Date   DDIMER 2.01 (H) 11/17/2020   Does the Patient currently have chest pain? No    R Recommendations: See Admitting Provider Note  Report given to:   Additional Notes:

## 2023-09-23 NOTE — ED Provider Notes (Signed)
[name] Provider Note   CSN: [id number] Arrival date & time: 09/23/23  1152     History  Chief Complaint  Patient presents with   LVAD/abd pain    Kevin Underwood. is a 62 y.o. male.  HPI    62 year old male with medical history significant for DM2, HTN, CAD status post CABG, CHF now LVAD dependent, abdominal hernia presenting to the emergency department with nausea, vomiting, diarrhea for the last 2 days, chills with associated mild shortness of breath.  Was initially in that clinic with his wife.  He has been endorsing severe abdominal pain.  He states that he is passing gas.  He continuously is passing loose stools.  Cardiology bedside on patient arrival, patient VAD has had low flow and cardiology is recommending a bolus.  He continues to endorse 10 out of 10 pain at his hernia site.  He had 1 small bowel movement last night and again this morning.  He endorses decreased appetite, nausea and dry heaves.  Of note, the patient also has chronic driveline infection and has been on ciprofloxacin and minocycline.  Home Medications Prior to Admission medications   Medication Sig Start Date End Date Taking? Authorizing Provider  acetaminophen (TYLENOL) 325 MG tablet Take 2 tablets (650 mg total) by mouth every 4 (four) hours as needed for headache or mild pain. 02/20/23  Yes Alen Bleacher, NP  albuterol (PROVENTIL) (2.5 MG/3ML) 0.083% nebulizer solution INHALE 3 ML BY NEBULIZATION EVERY 6 HOURS AS NEEDED FOR WHEEZING OR SHORTNESS OF BREATH Patient taking differently: Take 2.5 mg by nebulization every 6 (six) hours as needed for shortness of breath. 09/10/22  Yes Laurey Morale, MD  albuterol (VENTOLIN HFA) 108 (90 Base) MCG/ACT inhaler Inhale 2 puffs into the lungs every 6 (six) hours as needed for wheezing or shortness of breath.   Yes [provider]  allopurinol (ZYLOPRIM) 100 MG tablet Take 1 tablet (100 mg total) by mouth  daily. 12/04/21  Yes Laurey Morale, MD  amLODipine (NORVASC) 10 MG tablet Take 1 tablet (10 mg total) by mouth daily. 08/16/23  Yes Andrey Farmer, PA-C  ciprofloxacin (CIPRO) 750 MG tablet Take 1 tablet (750 mg total) by mouth 2 (two) times daily. 09/10/23  Yes Sabharwal, Aditya, DO  colchicine 0.6 MG tablet Take 1 tablet (0.6 mg total) by mouth daily as needed (gout pain). Patient taking differently: Take 0.6 mg by mouth daily. 04/14/23  Yes Laurey Morale, MD  COMBIVENT RESPIMAT 20-100 MCG/ACT AERS respimat Inhale 1 puff into the lungs every 6 (six) hours. 09/10/22  Yes [provider]  fluticasone (FLONASE ALLERGY RELIEF) 50 MCG/ACT nasal spray Place 2 sprays into both nostrils as needed for allergies or rhinitis.   Yes [provider]  gabapentin (NEURONTIN) 300 MG capsule Take 1 capsule (300 mg total) by mouth 3 (three) times daily. 08/15/23  Yes Andrey Farmer, PA-C  glipiZIDE (GLUCOTROL) 5 MG tablet Take 1 tablet (5 mg total) by mouth daily. 04/14/23 04/13/24 Yes Laurey Morale, MD  guaifenesin (HUMIBID E) 400 MG TABS tablet Take 400 mg by mouth every 6 (six) hours as needed (for congestion).   Yes [provider]  loratadine (CLARITIN) 10 MG tablet Take 1 tablet (10 mg total) by mouth daily. 04/14/23  Yes Laurey Morale, MD  losartan (COZAAR) 50 MG tablet Take 1 tablet (50 mg total) by mouth daily. 08/16/23  Yes Andrey Farmer, PA-C  Menthol, Topical Analgesic, (BIOFREEZE) 10 % CREA Apply 1 Application topically 2 (two) times daily as needed (Apply to all joints for pain).   Yes [provider]  metFORMIN (GLUCOPHAGE) 1000 MG tablet Take 1 tablet (1,000 mg total) by mouth 2 (two) times daily. 04/14/23  Yes Laurey Morale, MD  metoprolol succinate (TOPROL XL) 25 MG 24 hr tablet Take 1 tablet (25 mg total) by mouth daily. 08/16/23  Yes Andrey Farmer, PA-C  minocycline (MINOCIN) 100 MG capsule Take 2 capsules (200 mg total) by mouth 2 (two)  times daily. 09/10/23  Yes Sabharwal, Aditya, DO  mirtazapine (REMERON) 15 MG tablet Take 1 tablet (15 mg total) by mouth at bedtime. 04/14/23  Yes Laurey Morale, MD  Multiple Vitamin (MULTIVITAMIN WITH MINERALS) TABS tablet Take 1 tablet by mouth daily. 02/20/23  Yes Alen Bleacher, NP  Oxycodone HCl 10 MG TABS Take 1 tablet (10 mg total) by mouth every 6 (six) hours as needed. 08/15/23  Yes Andrey Farmer, PA-C  pantoprazole (PROTONIX) 40 MG tablet Take 1 tablet (40 mg total) by mouth daily. 04/14/23  Yes Laurey Morale, MD  rosuvastatin (CRESTOR) 20 MG tablet Take 1 tablet (20 mg total) by mouth daily. 08/15/23  Yes Andrey Farmer, PA-C  sertraline (ZOLOFT) 50 MG tablet Take 1 tablet (50 mg total) by mouth daily. Patient taking differently: Take 50 mg by mouth at bedtime. 04/14/23  Yes Laurey Morale, MD  spironolactone (ALDACTONE) 25 MG tablet Take 0.5 tablets (12.5 mg total) by mouth daily. Patient taking differently: Take 25 mg by mouth daily. 08/16/23  Yes Andrey Farmer, PA-C  tamsulosin (FLOMAX) 0.4 MG CAPS capsule Take 1 capsule (0.4 mg total) by mouth daily. 04/14/23  Yes Laurey Morale, MD  thiamine (VITAMIN B-1) 100 MG tablet Take 1 tablet (100 mg total) by mouth daily. 02/20/23  Yes Alen Bleacher, NP  traMADol (ULTRAM) 50 MG tablet Take 2 tablets (100 mg total) by mouth every 12 (twelve) hours as needed. 08/15/23  Yes Andrey Farmer, PA-C  traZODone (DESYREL) 150 MG tablet Take 1 tablet (150 mg total) by mouth at bedtime. 03/11/22  Yes Laurey Morale, MD  TRELEGY ELLIPTA 100-62.5-25 MCG/ACT AEPB Inhale 1 puff into the lungs daily. 04/14/23  Yes Laurey Morale, MD  warfarin (COUMADIN) 4 MG tablet Take 1-1.5 tablets (4-6 mg total) by mouth See admin instructions. Take 1 tablet (4 mg) every Tuesday and Thursday, Take 1.5 tabs (6 mg) all other days 08/15/23  Yes Andrey Farmer, PA-C      Allergies    Firvanq [vancomycin]    Review of Systems   Review of Systems   Constitutional:  Positive for chills.  Respiratory:  Positive for shortness of breath.   Gastrointestinal:  Positive for abdominal pain, diarrhea, nausea and vomiting.  All other systems reviewed and are negative.   Physical Exam Updated Vital Signs BP 130/89   Pulse (!) 109   Temp 98.1 F (36.7 C) (Oral)   Resp (!) 25   Ht 5\' 10"  (1.778 m)   Wt 86.2 kg   SpO2 100%   BMI 27.26 kg/m  Physical Exam Vitals and nursing note reviewed.  Constitutional:      General: He is not in acute distress.    Appearance: He is well-developed.  HENT:     Head: Normocephalic and atraumatic.  Eyes:     Conjunctiva/sclera: Conjunctivae normal.  Cardiovascular:     Rate  and Rhythm: Normal rate and regular rhythm.     Comments: Continuous machinelike murmur Pulmonary:     Effort: Pulmonary effort is normal. No respiratory distress.     Breath sounds: Normal breath sounds.  Abdominal:     Palpations: Abdomen is soft.     Tenderness: There is generalized abdominal tenderness.     Comments: Large ventral abdominal wall hernia, mildly tender to palpation, no significant evidence of erythema or discharge to drive line insertion site  Musculoskeletal:        General: No swelling.     Cervical back: Neck supple.  Skin:    General: Skin is warm and dry.     Capillary Refill: Capillary refill takes less than 2 seconds.  Neurological:     Mental Status: He is alert.  Psychiatric:        Mood and Affect: Mood normal.     ED Results / Procedures / Treatments   Labs (all labs ordered are listed, but only abnormal results are displayed) Labs Reviewed  LACTIC ACID, PLASMA - Abnormal; Notable for the following components:      Result Value   Lactic Acid, Venous 3.6 (*)    All other components within normal limits  LACTIC ACID, PLASMA - Abnormal; Notable for the following components:   Lactic Acid, Venous 2.6 (*)    All other components within normal limits  I-STAT CHEM 8, ED - Abnormal;  Notable for the following components:   Glucose, Bld 196 (*)    Calcium, Ion 1.03 (*)    TCO2 21 (*)    Hemoglobin 11.9 (*)    HCT 35.0 (*)    All other components within normal limits  RESP PANEL BY RT-PCR (RSV, FLU A&B, COVID)  RVPGX2  RAPID URINE DRUG SCREEN, HOSP PERFORMED  LIPASE, BLOOD  MAGNESIUM  HEPATIC FUNCTION PANEL    EKG None  Radiology DG Chest Portable 1 View  Result Date: 09/23/2023 CLINICAL DATA:  Shortness of breath. EXAM: PORTABLE CHEST 1 VIEW COMPARISON:  09/08/2023. FINDINGS: Similar cardiomediastinal silhouette. LVAD in similar location. Median sternotomy. Atrial appendage clip. No consolidation. No visible pleural effusions or pneumothorax. Remote left clavicular fracture. Polyarticular degenerative change. IMPRESSION: No evidence of acute cardiopulmonary disease. Electronically Signed   By: Feliberto Harts M.D.   On: 09/23/2023 15:27    Procedures Procedures    Medications Ordered in ED Medications  ondansetron (ZOFRAN) injection 4 mg (4 mg Intravenous Given 09/23/23 1215)  HYDROmorphone (DILAUDID) injection 0.5 mg (0.5 mg Intravenous Given 09/23/23 1637)  ciprofloxacin (CIPRO) tablet 750 mg (has no administration in time range)  minocycline (MINOCIN) capsule 200 mg (has no administration in time range)  metoprolol succinate (TOPROL-XL) 24 hr tablet 25 mg (25 mg Oral Given 09/23/23 1637)  losartan (COZAAR) tablet 50 mg (50 mg Oral Given 09/23/23 1637)  tamsulosin (FLOMAX) capsule 0.4 mg (0.4 mg Oral Given 09/23/23 1637)  mirtazapine (REMERON) tablet 15 mg (has no administration in time range)  sertraline (ZOLOFT) tablet 50 mg (has no administration in time range)  allopurinol (ZYLOPRIM) tablet 100 mg (has no administration in time range)  rosuvastatin (CRESTOR) tablet 20 mg (has no administration in time range)  lactated ringers bolus 500 mL (0 mLs Intravenous Stopped 09/23/23 1330)  iohexol (OMNIPAQUE) 350 MG/ML injection 75 mL (75 mLs Intravenous  Contrast Given 09/23/23 1433)  lactated ringers bolus 500 mL (0 mLs Intravenous Stopped 09/23/23 1523)    ED Course/ Medical Decision Making/ A&P  Medical Decision Making Amount and/or Complexity of Data Reviewed Labs: ordered. Radiology: ordered.  Risk Prescription drug management. Decision regarding hospitalization.    62 year old male with medical history significant for DM2, HTN, CAD status post CABG, CHF now LVAD dependent, abdominal hernia presenting to the emergency department with nausea, vomiting, diarrhea for the last 2 days, chills with associated mild shortness of breath.  Was initially in that clinic with his wife.  He has been endorsing severe abdominal pain.  He states that he is passing gas.  He continuously is passing loose stools.  Cardiology bedside on patient arrival, patient VAD has had low flow and cardiology is recommending a bolus.  He continues to endorse 10 out of 10 pain at his hernia site.  He had 1 small bowel movement last night and again this morning.  He endorses decreased appetite, nausea and dry heaves.  Of note, the patient also has chronic driveline infection and has been on ciprofloxacin and minocycline.  On arrival, the patient was afebrile, temperature 98.1, MAP of 132 noted.  Physical exam concerning for large ventral abdominal wall hernia, mild surrounding tenderness.  Considered bowel obstruction, colitis, diverticulitis, incarcerated hernia.  Considered gastroenteritis with associated colitis.  Considered other intra-abdominal infection.  Considered mesenteric ischemia.  Cardiology bedside and plans to admit the patient for pain control in the setting of his symptoms.  Will plan for screening labs, fluid resuscitation, pain control and antiemetics, CT of the abdomen pelvis with plan for likely admission.  Plan for 500 cc bolus and IV Zofran.  CXR: IMPRESSION:  No evidence of acute cardiopulmonary disease.        Labs: Lactic acid elevated to 3.6, downtrending to 2.6 after fluids, COVID-19 influenza PCR testing negative, UDS normal, i-STAT Chem-8 with a serum creatinine of 0.8, hyperglycemia 196 noted, hemoglobin 11.9.  Remaining labs were pending at time of admission.  CT Abdomen Pelvis: Pending at time of admission  Cardiology subsequently admitted the patient to their service, Dr. Shirlee Latch attending prior to full results of diagnostic testing.   Final Clinical Impression(s) / ED Diagnoses Final diagnoses:  LVAD (left ventricular assist device) present (HCC)  Generalized abdominal pain  Abdominal wall hernia  Lactic acidosis    Rx / DC Orders ED Discharge Orders     None         Ernie Avena, MD 09/23/23 1645

## 2023-09-24 DIAGNOSIS — Z95811 Presence of heart assist device: Secondary | ICD-10-CM | POA: Diagnosis not present

## 2023-09-24 DIAGNOSIS — T827XXA Infection and inflammatory reaction due to other cardiac and vascular devices, implants and grafts, initial encounter: Secondary | ICD-10-CM | POA: Diagnosis not present

## 2023-09-24 DIAGNOSIS — A4902 Methicillin resistant Staphylococcus aureus infection, unspecified site: Secondary | ICD-10-CM

## 2023-09-24 DIAGNOSIS — K567 Ileus, unspecified: Secondary | ICD-10-CM

## 2023-09-24 DIAGNOSIS — T827XXD Infection and inflammatory reaction due to other cardiac and vascular devices, implants and grafts, subsequent encounter: Secondary | ICD-10-CM

## 2023-09-24 DIAGNOSIS — Z8619 Personal history of other infectious and parasitic diseases: Secondary | ICD-10-CM

## 2023-09-24 DIAGNOSIS — R1084 Generalized abdominal pain: Secondary | ICD-10-CM

## 2023-09-24 LAB — COMPREHENSIVE METABOLIC PANEL
ALT: 24 U/L (ref 0–44)
AST: 25 U/L (ref 15–41)
Albumin: 2.8 g/dL — ABNORMAL LOW (ref 3.5–5.0)
Alkaline Phosphatase: 125 U/L (ref 38–126)
Anion gap: 13 (ref 5–15)
BUN: 30 mg/dL — ABNORMAL HIGH (ref 8–23)
CO2: 22 mmol/L (ref 22–32)
Calcium: 8.4 mg/dL — ABNORMAL LOW (ref 8.9–10.3)
Chloride: 96 mmol/L — ABNORMAL LOW (ref 98–111)
Creatinine, Ser: 1.44 mg/dL — ABNORMAL HIGH (ref 0.61–1.24)
GFR, Estimated: 55 mL/min — ABNORMAL LOW (ref >60)
Glucose, Bld: 173 mg/dL — ABNORMAL HIGH (ref 70–99)
Potassium: 4.6 mmol/L (ref 3.5–5.1)
Sodium: 131 mmol/L — ABNORMAL LOW (ref 135–145)
Total Bilirubin: 0.4 mg/dL (ref 0.3–1.2)
Total Protein: 6.6 g/dL (ref 6.5–8.1)

## 2023-09-24 LAB — GLUCOSE, CAPILLARY
Glucose-Capillary: 161 mg/dL — ABNORMAL HIGH (ref 70–99)
Glucose-Capillary: 167 mg/dL — ABNORMAL HIGH (ref 70–99)
Glucose-Capillary: 168 mg/dL — ABNORMAL HIGH (ref 70–99)
Glucose-Capillary: 218 mg/dL — ABNORMAL HIGH (ref 70–99)

## 2023-09-24 LAB — BASIC METABOLIC PANEL
Anion gap: 13 (ref 5–15)
BUN: 25 mg/dL — ABNORMAL HIGH (ref 8–23)
CO2: 23 mmol/L (ref 22–32)
Calcium: 8.6 mg/dL — ABNORMAL LOW (ref 8.9–10.3)
Chloride: 95 mmol/L — ABNORMAL LOW (ref 98–111)
Creatinine, Ser: 1.46 mg/dL — ABNORMAL HIGH (ref 0.61–1.24)
GFR, Estimated: 54 mL/min — ABNORMAL LOW (ref >60)
Glucose, Bld: 201 mg/dL — ABNORMAL HIGH (ref 70–99)
Potassium: 4.3 mmol/L (ref 3.5–5.1)
Sodium: 131 mmol/L — ABNORMAL LOW (ref 135–145)

## 2023-09-24 LAB — CBC
HCT: 38.2 % — ABNORMAL LOW (ref 39.0–52.0)
Hemoglobin: 12.6 g/dL — ABNORMAL LOW (ref 13.0–17.0)
MCH: 25.6 pg — ABNORMAL LOW (ref 26.0–34.0)
MCHC: 33 g/dL (ref 30.0–36.0)
MCV: 77.6 fL — ABNORMAL LOW (ref 80.0–100.0)
Platelets: 294 10*3/uL (ref 150–400)
RBC: 4.92 MIL/uL (ref 4.22–5.81)
RDW: 14.6 % (ref 11.5–15.5)
WBC: 25.7 10*3/uL — ABNORMAL HIGH (ref 4.0–10.5)
nRBC: 0 % (ref 0.0–0.2)

## 2023-09-24 LAB — LACTIC ACID, PLASMA
Lactic Acid, Venous: 3.3 mmol/L (ref 0.5–1.9)
Lactic Acid, Venous: 2.7 mmol/L (ref 0.5–1.9)
Lactic Acid, Venous: 2.8 mmol/L (ref 0.5–1.9)

## 2023-09-24 LAB — PROTIME-INR
INR: 3.2 — ABNORMAL HIGH (ref 0.8–1.2)
Prothrombin Time: 33.3 s — ABNORMAL HIGH (ref 11.4–15.2)

## 2023-09-24 LAB — LACTATE DEHYDROGENASE: LDH: 149 U/L (ref 98–192)

## 2023-09-24 MED ORDER — INSULIN ASPART 100 UNIT/ML IJ SOLN
0.0000 [IU] | Freq: Three times a day (TID) | INTRAMUSCULAR | Status: DC
  Administered 2023-09-24 – 2023-09-25 (×2): 3 [IU] via SUBCUTANEOUS
  Administered 2023-09-25: 2 [IU] via SUBCUTANEOUS
  Administered 2023-09-25: 3 [IU] via SUBCUTANEOUS
  Administered 2023-09-26: 2 [IU] via SUBCUTANEOUS
  Administered 2023-09-26: 3 [IU] via SUBCUTANEOUS
  Administered 2023-09-26 – 2023-09-27 (×2): 5 [IU] via SUBCUTANEOUS
  Administered 2023-09-27: 2 [IU] via SUBCUTANEOUS
  Administered 2023-09-27: 3 [IU] via SUBCUTANEOUS
  Administered 2023-09-28: 5 [IU] via SUBCUTANEOUS
  Administered 2023-09-28: 3 [IU] via SUBCUTANEOUS
  Administered 2023-09-28 – 2023-09-29 (×2): 5 [IU] via SUBCUTANEOUS
  Administered 2023-09-29 (×2): 3 [IU] via SUBCUTANEOUS
  Administered 2023-09-30: 2 [IU] via SUBCUTANEOUS
  Administered 2023-09-30: 3 [IU] via SUBCUTANEOUS
  Administered 2023-09-30: 8 [IU] via SUBCUTANEOUS
  Administered 2023-10-01: 3 [IU] via SUBCUTANEOUS
  Administered 2023-10-01: 5 [IU] via SUBCUTANEOUS
  Administered 2023-10-01: 3 [IU] via SUBCUTANEOUS
  Administered 2023-10-02: 2 [IU] via SUBCUTANEOUS
  Administered 2023-10-02: 11 [IU] via SUBCUTANEOUS

## 2023-09-24 MED ORDER — SODIUM CHLORIDE 0.9 % IV SOLN
2.0000 g | Freq: Three times a day (TID) | INTRAVENOUS | Status: AC
  Administered 2023-09-24 – 2023-09-30 (×20): 2 g via INTRAVENOUS
  Filled 2023-09-24 (×20): qty 12.5

## 2023-09-24 MED ORDER — GABAPENTIN 300 MG PO CAPS
300.0000 mg | ORAL_CAPSULE | Freq: Three times a day (TID) | ORAL | Status: DC
  Administered 2023-09-24 – 2023-10-01 (×21): 300 mg via ORAL
  Filled 2023-09-24 (×22): qty 1

## 2023-09-24 MED ORDER — MAGNESIUM SULFATE 2 GM/50ML IV SOLN
2.0000 g | Freq: Once | INTRAVENOUS | Status: AC
  Administered 2023-09-24: 2 g via INTRAVENOUS
  Filled 2023-09-24: qty 50

## 2023-09-24 MED ORDER — SENNOSIDES-DOCUSATE SODIUM 8.6-50 MG PO TABS
2.0000 | ORAL_TABLET | Freq: Every day | ORAL | Status: DC
  Administered 2023-09-24 – 2023-10-01 (×6): 2 via ORAL
  Filled 2023-09-24 (×8): qty 2

## 2023-09-24 MED ORDER — DAPTOMYCIN-SODIUM CHLORIDE 700-0.9 MG/100ML-% IV SOLN
8.0000 mg/kg | Freq: Every day | INTRAVENOUS | Status: AC
  Administered 2023-09-24 – 2023-09-30 (×7): 700 mg via INTRAVENOUS
  Filled 2023-09-24 (×7): qty 100

## 2023-09-24 MED ORDER — POLYETHYLENE GLYCOL 3350 17 G PO PACK
17.0000 g | PACK | Freq: Every day | ORAL | Status: DC
  Administered 2023-09-24 – 2023-09-25 (×2): 17 g via ORAL
  Filled 2023-09-24 (×3): qty 1

## 2023-09-24 NOTE — Consult Note (Signed)
Date of Admission:  09/23/2023          Reason for Consult: Leukocytosis, pt with known chronic driveline infection    Referring Provider: Elly Modena, MD   Assessment:  Ileus, hernia likely causing his leukocytosis Chronic driveline infection with MRSA, due to bacterial and Pseudomonas having been isolated LVAD PAF Prior methamphetamine use  Plan:  Supportive measure for #1 Agree with daptomycin and cefepime for his MRSA and Pseudomonas while he is in the hospital recovering from his ileus and continue his minocycline if he can tolerate it orally to cover the Acinetobacter As he improves we will plan on changing him back to his oral regimen  Principal Problem:   Abdominal pain   Scheduled Meds:  allopurinol  100 mg Oral Daily   gabapentin  300 mg Oral TID   losartan  50 mg Oral Daily   metoprolol succinate  25 mg Oral Daily   minocycline  200 mg Oral BID   mirtazapine  15 mg Oral QHS   rosuvastatin  20 mg Oral Daily   sertraline  50 mg Oral Daily   spironolactone  12.5 mg Oral Daily   tamsulosin  0.4 mg Oral Daily   Continuous Infusions:  ceFEPime (MAXIPIME) IV     DAPTOmycin     magnesium sulfate bolus IVPB     PRN Meds:.hydrALAZINE, HYDROmorphone (DILAUDID) injection, ondansetron (ZOFRAN) IV  HPI: Kevin Underwood. is a 62 y.o. male with LVAD and known chronic LVAD driveline infection with Acinetobacter bowel Moh[place] MRSA and more recently Pseudomonas having been isolated and having required multiple debridements he was admitted earlier this month with bodyaches chills malaise and a runny nose.  He was seen by my partner Dr. Drue Second along with Rexene Alberts and thought to be suffering from an upper respiratory tract infection he was on IV cefepime and daptomycin in the hospital long with minocycline but was ultimately switched back to high-dose minocycline and ciprofloxacin he did have a culture taken from his driveline area on 1 October which did  grow MRSA.  Organism was resistant to Bactrim and of course oxacillin but otherwise sensitive including to tetracycline which means that it should still have been sensitive to minocycline I suspect his isolation has nothing to do with his antibiotics being the wrong choice but with the fact that he has a chronic foreign body in place which makes eradication of these types of infections and possible.  I have also heard that he is not the best patient at being adherent to oral therapy.  In any case he is now readmitted severe abdominal pain.  The abdomen pelvis performed yesterday showed some distention of the left upper quadrant of the jejunal bowel loops suggestive of mild ileus versus enteritis.  I would suspect it is the former given the amount of pain he is suffering from there was fortunately no evidence for intra-abdominal or pelvic abnormalities.  The LVAD also did not show any evidence of having a new abscess that would need surgical attention.  He is also has a large wide necked hernia containing mesentery and bowel but no obstruction incarceration present as well.  Cultures were taken as well as urine cultures.) I would not of done urine culture since he lacks urinary symptoms and these cultures lead to misleading concerns about urinary tract infections in people who are colonized  To be improving symptomatically though his white blood cell count is continued to rise from his  admission value of 16,000-25,700 with a predominance of neutrophils.  My own suspicion is that the ileus and/or the hernia are likely the reasons for his leukocytosis.  His LVAD has no evidence of a drainable abscess that would require surgery.  For now I think is reasonable to have him on daptomycin to cover MRSA and cefepime to cover Pseudomonas.  He is still taking the minocycline for coverage of his Acinetobacter and says he is able to take it without difficulty. Would plan on eventually switching him back to his  oral therapy. My understanding from talking to my team (I have not seen the patient ever before that I can recall) he is NOT a candidate for home IV antibiotics.   I have personally spent 84 minutes involved in face-to-face and non-face-to-face activities for this patient on the day of the visit. Professional time spent includes the following activities: Preparing to see the patient (review of tests), Obtaining and/or reviewing separately obtained history (admission/discharge record), Performing a medically appropriate examination and/or evaluation , Ordering medications/tests/procedures, referring and communicating with other health care professionals, Documenting clinical information in the EMR, Independently interpreting results (not separately reported), Communicating results to the patient/family/caregiver, Counseling and educating the patient/family/caregiver and Care coordination (not separately reported).      Review of Systems: Review of Systems  Constitutional:  Negative for chills, diaphoresis, fever, malaise/fatigue and weight loss.  HENT:  Negative for congestion, hearing loss, sore throat and tinnitus.   Eyes:  Negative for blurred vision and double vision.  Respiratory:  Negative for cough, sputum production, shortness of breath and wheezing.   Cardiovascular:  Negative for chest pain, palpitations and leg swelling.  Gastrointestinal:  Positive for abdominal pain and nausea. Negative for blood in stool, constipation, diarrhea, heartburn, melena and vomiting.  Genitourinary:  Negative for dysuria, flank pain and hematuria.  Musculoskeletal:  Negative for back pain, falls, joint pain and myalgias.  Skin:  Negative for itching and rash.  Neurological:  Negative for dizziness, sensory change, focal weakness, loss of consciousness, weakness and headaches.  Endo/Heme/Allergies:  Does not bruise/bleed easily.  Psychiatric/Behavioral:  Negative for depression, memory loss and suicidal  ideas. The patient is not nervous/anxious.     Past Medical History:  Diagnosis Date   "    Arthritis    CAD (coronary artery disease)    a. s/p CABG in 11/2019 with LIMA-LAD, SVG-OM1, SVG-PDA and SVG-D1   CHF (congestive heart failure) (HCC)    a. EF < 20% by echo in 11/2019   Essential hypertension    PAF (paroxysmal atrial fibrillation) (HCC)    Type 2 diabetes mellitus (HCC)     Social History   Tobacco Use   Smoking status: Some Days    Current packs/day: 0.00    Average packs/day: 1 pack/day for 30.0 years (30.0 ttl pk-yrs)    Types: Cigarettes    Start date: 12/19/1989    Last attempt to quit: 12/20/2019    Years since quitting: 3.7   Smokeless tobacco: Never  Vaping Use   Vaping status: Never Used  Substance Use Topics   Alcohol use: Not Currently    Comment: Prior history of excessive intake   Drug use: Not Currently    Types: Methamphetamines    Family History  Problem Relation Age of Onset   Heart disease Sister        Tumor?   Arthritis Other    Lung disease Other    Asthma Other    Diabetes  Other    Thyroid disease Neg Hx    Allergies  Allergen Reactions   Firvanq [Vancomycin] Nausea And Vomiting and Other (See Comments)    Arthralgias     OBJECTIVE: Blood pressure (!) 82/65, pulse 100, temperature 97.8 F (36.6 C), temperature source Oral, resp. rate 17, height 5\' 10"  (1.778 m), weight 84.2 kg, SpO2 98%.  Physical Exam Constitutional:      Appearance: He is well-developed.  HENT:     Head: Normocephalic and atraumatic.  Eyes:     Conjunctiva/sclera: Conjunctivae normal.  Cardiovascular:     Rate and Rhythm: Normal rate and regular rhythm.  Pulmonary:     Effort: Pulmonary effort is normal. No respiratory distress.     Breath sounds: No wheezing.  Abdominal:     General: There is distension.     Palpations: Abdomen is soft.     Tenderness: There is abdominal tenderness.  Musculoskeletal:        General: No tenderness. Normal range of  motion.     Cervical back: Normal range of motion and neck supple.  Skin:    General: Skin is warm and dry.     Coloration: Skin is not pale.     Findings: No erythema or rash.  Neurological:     General: No focal deficit present.     Mental Status: He is alert and oriented to person, place, and time.  Psychiatric:        Mood and Affect: Mood normal.        Behavior: Behavior normal.        Thought Content: Thought content normal.        Judgment: Judgment normal.    Bandage over driveline  Lab Results Lab Results  Component Value Date   WBC 25.7 (H) 09/24/2023   HGB 12.6 (L) 09/24/2023   HCT 38.2 (L) 09/24/2023   MCV 77.6 (L) 09/24/2023   PLT 294 09/24/2023    Lab Results  Component Value Date   CREATININE 1.46 (H) 09/24/2023   BUN 25 (H) 09/24/2023   NA 131 (L) 09/24/2023   K 4.3 09/24/2023   CL 95 (L) 09/24/2023   CO2 23 09/24/2023    Lab Results  Component Value Date   ALT 25 09/23/2023   AST 22 09/23/2023   ALKPHOS 141 (H) 09/23/2023   BILITOT 0.6 09/23/2023     Microbiology: Recent Results (from the past 240 hour(s))  Resp panel by RT-PCR (RSV, Flu A&B, Covid) Anterior Nasal Swab     Status: None   Collection Time: 09/23/23 12:45 PM   Specimen: Anterior Nasal Swab  Result Value Ref Range Status   SARS Coronavirus 2 by RT PCR NEGATIVE NEGATIVE Final   Influenza A by PCR NEGATIVE NEGATIVE Final   Influenza B by PCR NEGATIVE NEGATIVE Final    Comment: (NOTE) The Xpert Xpress SARS-CoV-2/FLU/RSV plus assay is intended as an aid in the diagnosis of influenza from Nasopharyngeal swab specimens and should not be used as a sole basis for treatment. Nasal washings and aspirates are unacceptable for Xpert Xpress SARS-CoV-2/FLU/RSV testing.  Fact Sheet for Patients: BloggerCourse.com  Fact Sheet for Healthcare Providers: SeriousBroker.it  This test is not yet approved or cleared by the [place] FDA  and has been authorized for detection and/or diagnosis of SARS-CoV-2 by FDA under an Emergency Use Authorization (EUA). This EUA will remain in effect (meaning this test can be used) for the duration of the COVID-19 declaration under Section 564(b)(1) of  the Act, 21 U.S.C. section 360bbb-3(b)(1), unless the authorization is terminated or revoked.     Resp Syncytial Virus by PCR NEGATIVE NEGATIVE Final    Comment: (NOTE) Fact Sheet for Patients: BloggerCourse.com  Fact Sheet for Healthcare Providers: SeriousBroker.it  This test is not yet approved or cleared by the [place] FDA and has been authorized for detection and/or diagnosis of SARS-CoV-2 by FDA under an Emergency Use Authorization (EUA). This EUA will remain in effect (meaning this test can be used) for the duration of the COVID-19 declaration under Section 564(b)(1) of the Act, 21 U.S.C. section 360bbb-3(b)(1), unless the authorization is terminated or revoked.  Performed at [facility], [address], [place], [place] [address]   Culture, blood (Routine X 2) w Reflex to ID Panel     Status: None (Preliminary result)   Collection Time: 09/23/23  5:19 PM   Specimen: BLOOD RIGHT ARM  Result Value Ref Range Status   Specimen Description BLOOD RIGHT ARM  Final   Special Requests   Final    BOTTLES DRAWN AEROBIC ONLY Blood Culture results may not be optimal due to an inadequate volume of blood received in culture bottles   Culture   Final    NO GROWTH < 24 HOURS Performed at [facility], [address], [place], [place] [address]    Report Status PENDING  Incomplete  Culture, blood (Routine X 2) w Reflex to ID Panel     Status: None (Preliminary result)   Collection Time: 09/23/23  5:19 PM   Specimen: BLOOD RIGHT ARM  Result Value Ref Range Status   Specimen Description BLOOD RIGHT ARM  Final   Special Requests   Final    BOTTLES DRAWN AEROBIC ONLY  Blood Culture adequate volume   Culture   Final    NO GROWTH < 24 HOURS Performed at [facility], [address] [place], [place] [address]    Report Status PENDING  Incomplete    Acey Lav, MD [facility] [phone] pager  09/24/2023, 11:04 AM

## 2023-09-24 NOTE — Progress Notes (Addendum)
MAPs and doppler pressure 70s this afternoon. Pain under better control.   Reports he has not voided since this am.  Drinking clear liquids.  Scr 0.8>1.46  RECOMMENDATIONS:  Held amlodipine this am. Will keep off.  Hold losartan.   Recheck lactic acid and CMET.  Check bladder scan.

## 2023-09-24 NOTE — Evaluation (Addendum)
Occupational Therapy Evaluation Patient Details Name: Kevin Underwood. MRN: [id number] DOB: 05-13-1961 Today's Date: 09/24/2023   History of Present Illness 62 y.o. male admitted 10/15  Presenting with severe abdominal pain X 2-3 days. Admitted for further workup and pain control.  W/u for ileus with infection.  PMH: CAD s/p CABG, chronic systolic CHF s/p HM III VAD, chronic driveline infection, DM II, COPD, HTN, large abdominal hernia.   Clinical Impression   Pt reports ind at baseline with ADLs and functional mobility, lives with spouse but reports he doesn't go out much due to LVAD equipment. Pt currently needing supervision-minA for ADLs, ind with bed mobility and CGA for standing at EOB, pt reporting dizziness, but unable to obtain BP seated EOB, BP supine 100/66 (75). Pt presenting with impairments listed below, will follow acutely. Anticipate no OT follow up needs at d/c.   PF 4.9 PS 5600 PI 2.7 -- was variable, got up to ~10 with sitting/standing, RN aware PP 4.3 No alarming of VAD system noted throughout session, system with note "command not accepted" and RN notified, and in room to assess.        If plan is discharge home, recommend the following: A little help with walking and/or transfers;A little help with bathing/dressing/bathroom;Assistance with cooking/housework;Assist for transportation    Functional Status Assessment     Equipment Recommendations  None recommended by OT    Recommendations for Other Services PT consult     Precautions / Restrictions Precautions Precautions: Fall Restrictions Weight Bearing Restrictions: No      Mobility Bed Mobility Overal bed mobility: Independent                  Transfers Overall transfer level: Needs assistance Equipment used: None Transfers: Sit to/from Stand Sit to Stand: Contact guard assist                  Balance Overall balance assessment: Needs assistance Sitting-balance support: Feet  supported Sitting balance-Leahy Scale: Good     Standing balance support: During functional activity Standing balance-Leahy Scale: Fair                             ADL either performed or assessed with clinical judgement   ADL Overall ADL's : Needs assistance/impaired Eating/Feeding: Supervision/ safety   Grooming: Supervision/safety   Upper Body Bathing: Minimal assistance   Lower Body Bathing: Minimal assistance   Upper Body Dressing : Minimal assistance   Lower Body Dressing: Minimal assistance   Toilet Transfer: Contact guard assist           Functional mobility during ADLs: Contact guard assist       Vision   Additional Comments: reports he "needs glasses" but [facility] for BADL     Perception Perception: Not tested       Praxis Praxis: Not tested       Pertinent Vitals/Pain Pain Assessment Pain Assessment: Faces Pain Score: 4  Faces Pain Scale: Hurts little more Pain Location: abdomen Pain Descriptors / Indicators: Aching, Grimacing, Guarding Pain Intervention(s): Limited activity within patient's tolerance, Monitored during session, Repositioned     Extremity/Trunk Assessment Upper Extremity Assessment Upper Extremity Assessment: Generalized weakness   Lower Extremity Assessment Lower Extremity Assessment: Defer to PT evaluation   Cervical / Trunk Assessment Cervical / Trunk Assessment: Normal   Communication Communication Communication: No apparent difficulties   Cognition Arousal: Alert Behavior During Therapy: WFL for tasks assessed/performed Overall  Cognitive Status: Within Functional Limits for tasks assessed                                 General Comments: appears in depressed mood regarding recurrent hospitalizations and LVAD, asked if pt would like chaplain services to speak with him and pt declines     General Comments  see note for LVAD metrics, LVAD with blinking that states "command not accepted" on  screen, RN notified and in room to assess    Exercises     Shoulder Instructions      Home Living Family/patient expects to be discharged to:: Private residence Living Arrangements: Spouse/significant other Available Help at Discharge: Family;Available 24 hours/day Type of Home: House Home Access: Ramped entrance     Home Layout: One level     Bathroom Shower/Tub: Sponge bathes at baseline   Bathroom Toilet: Standard     Home Equipment: Rollator (4 wheels)          Prior Functioning/Environment Prior Level of Function : Needs assist;Driving             Mobility Comments: walks without device in house and sits on porch ADLs Comments: ind with ADLs, sopuse does med mgmt        OT Problem List:        OT Treatment/Interventions:      OT Goals(Current goals can be found in the care plan section) ADL Goals Pt Will Perform Upper Body Dressing: with modified independence;sitting Pt Will Perform Lower Body Dressing: with modified independence;sit to/from stand;sitting/lateral leans Pt Will Transfer to Toilet: with modified independence;ambulating;regular height toilet Additional ADL Goal #1: pt will perform x3 standing ADLs with good balance in prep for ADLs  OT Frequency: Min 1X/week    Co-evaluation              AM-PAC OT "6 Clicks" Daily Activity     Outcome Measure Help from another person eating meals?: None Help from another person taking care of personal grooming?: A Little Help from another person toileting, which includes using toliet, bedpan, or urinal?: A Little Help from another person bathing (including washing, rinsing, drying)?: A Little Help from another person to put on and taking off regular upper body clothing?: A Little Help from another person to put on and taking off regular lower body clothing?: A Little 6 Click Score: 19   End of Session Nurse Communication: Mobility status  Activity Tolerance: Patient tolerated treatment  well Patient left: in bed;with call bell/phone within reach;with bed alarm set  OT Visit Diagnosis: Other abnormalities of gait and mobility (R26.89);Unsteadiness on feet (R26.81);Muscle weakness (generalized) (M62.81)                Time: 9562-1308 OT Time Calculation (min): 24 min Charges:  OT General Charges $OT Visit: 1 Visit OT Evaluation $OT Eval Low Complexity: 1 Low  Carver Fila, OTD, OTR/L SecureChat Preferred Acute Rehab [phone]   [name] 09/24/2023, 2:43 PM

## 2023-09-24 NOTE — TOC Initial Note (Signed)
Transition of Care (TOC) - Initial/Assessment Note    Patient Details  Name: Kevin Underwood. MRN: [id number] Date of Birth: 03/30/1961  Transition of Care [facility]) CM/SW Contact:    Nicanor Bake Phone Number: [phone] 09/24/2023, 11:09 AM  Clinical Narrative:     HF CSW met with pt and wife, [name] at bedside. Pt stated that he was having extreme stomach pains and it is unbearable. Pt stated that he thinks with antibiotics and pain meds he will be ok. Pts wife stated at this time she does not need anything.   TOC will continue following.                  Patient Goals and CMS Choice            Expected Discharge Plan and Services                                              Prior Living Arrangements/Services                       Activities of Daily Living   ADL Screening (condition at time of admission) Independently performs ADLs?: No Does the patient have a NEW difficulty with bathing/dressing/toileting/self-feeding that is expected to last >3 days?: Yes (Initiates electronic notice to provider for possible OT consult) (weak) Does the patient have a NEW difficulty with getting in/out of bed, walking, or climbing stairs that is expected to last >3 days?: Yes (Initiates electronic notice to provider for possible PT consult) (weak) Does the patient have a NEW difficulty with communication that is expected to last >3 days?: No Is the patient deaf or have difficulty hearing?: No Does the patient have difficulty seeing, even when wearing glasses/contacts?: No Does the patient have difficulty concentrating, remembering, or making decisions?: Yes  Permission Sought/Granted                  Emotional Assessment              Admission diagnosis:  Lactic acidosis [E87.20] Generalized abdominal pain [R10.84] Abdominal wall hernia [K43.9] LVAD (left ventricular assist device) present (HCC) [Z95.811] Abdominal pain  [R10.9] Patient Active Problem List   Diagnosis Date Noted   Abdominal pain 09/23/2023   Sepsis (HCC) 09/08/2023   Stimulant use disorder 02/09/2023   Depression 02/09/2023   Pain and swelling of right wrist 09/30/2022   Hypotension 08/07/2022   MRSA infection    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 (LVAD) (HCC)    Left ventricular assist device (LVAD) complication, subsequent encounter 05/13/2022   AKI (acute kidney injury) (HCC) 05/13/2022   Hyperthyroidism 01/22/2022   Chronic systolic heart failure (HCC)    COPD (chronic obstructive pulmonary disease) (HCC) 09/09/2021   Malnutrition of moderate degree 11/17/2020   CHF (congestive heart failure), NYHA class IV, chronic, systolic (HCC) 11/17/2020   Palliative care by specialist    Goals of care, counseling/discussion    Hyperlipidemia    CHF exacerbation (HCC) 09/23/2020   Unstable angina (HCC) 09/22/2020   LVAD (left ventricular assist device) present (HCC)    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   Cardiogenic shock (HCC)    Acute respiratory failure (HCC)    RUQ abdominal pain    Cardiomyopathy (HCC) 11/21/2019   Acute systolic CHF (congestive heart failure) (  HCC) 11/21/2019   History of alcohol abuse Quit 6 mos ago 11/21/2019   History of recreational drug use 11/21/2019   PAF (paroxysmal atrial fibrillation) (HCC) 11/21/2019   Cardiac volume overload 11/21/2019   Elevated brain natriuretic peptide (BNP) level 11/21/2019   Leg edema, right 11/21/2019   Hyponatremia 11/21/2019   Abnormal electrocardiogram (ECG) (EKG) 11/21/2019   Hyperglycemia 11/21/2019   Tremulousness 11/21/2019   Restlessness and agitation 11/21/2019   OSA (obstructive sleep apnea) 11/21/2019   Atrial fibrillation with RVR (HCC) 11/20/2019   Acute congestive heart failure (HCC) 11/20/2019   Acute respiratory distress 11/20/2019   COPD with acute exacerbation (HCC)    Hypertension    Type 2 diabetes mellitus (HCC)    Closed  fracture of 5th metacarpal 08/31/2012   PCP:  [name], FNP Pharmacy:   [phone] - [place], [place] - [address] AT [facility] [address] [place] [address] Phone: [phone] Fax: [phone]     Social Determinants of Health (SDOH) Social History: SDOH Screenings   Food Insecurity: No Food Insecurity (09/23/2023)  Housing: Low Risk  (09/23/2023)  Transportation Needs: No Transportation Needs (09/23/2023)  Recent Concern: Transportation Needs - Unmet Transportation Needs (09/08/2023)  Utilities: Not At Risk (09/23/2023)  Alcohol Screen: Low Risk  (11/10/2020)  Depression (PHQ2-9): Low Risk  (10/16/2022)  Financial Resource Strain: Medium Risk (05/17/2022)  Physical Activity: Inactive (11/10/2020)  Tobacco Use: High Risk (09/23/2023)   SDOH Interventions:     Readmission Risk Interventions    07/01/2022    9:05 AM  Readmission Risk Prevention Plan  Transportation Screening Complete  Medication Review (RN Care Manager) Complete  PCP or Specialist appointment within 3-5 days of discharge Complete  HRI or Home Care Consult Complete  SW Recovery Care/Counseling Consult Complete  Palliative Care Screening Not Applicable  Skilled Nursing Facility Not Applicable

## 2023-09-24 NOTE — Evaluation (Signed)
Physical Therapy Evaluation Patient Details Name: Kevin Underwood. MRN: [id number] DOB: [birth year] Today's Date: 09/24/2023  History of Present Illness  62 y.o. male admitted 10/15  Presenting with severe abdominal pain X 2-3 days. Admitted for further workup and pain control.  W/u for ileus with infection.  PMH: CAD s/p CABG, chronic systolic CHF s/p HM III VAD, chronic driveline infection, DM II, COPD, HTN, large abdominal hernia.  Clinical Impression  Pt admitted with above diagnosis. Pt was able to take a few pivotal steps to the recliner from the bed with contact guard assist and cues for safety as pt is impulsive. Pt appeared irritated with PT that PT was asking him to mobilize and refused further mobility than getting to chair. Will follow acutely and progress pt as able.  Pt currently with functional limitations due to the deficits listed below (see PT Problem List). Pt will benefit from acute skilled PT to increase their independence and safety with mobility to allow discharge.           If plan is discharge home, recommend the following: Assistance with cooking/housework;Assist for transportation;Help with stairs or ramp for entrance   Can travel by private vehicle        Equipment Recommendations None recommended by PT  Recommendations for Other Services       Functional Status Assessment Patient has had a recent decline in their functional status and demonstrates the ability to make significant improvements in function in a reasonable and predictable amount of time.     Precautions / Restrictions Precautions Precautions: Fall Restrictions Weight Bearing Restrictions: No      Mobility  Bed Mobility Overal bed mobility: Independent             General bed mobility comments: Impulsively moves and doesnt wait for cuing    Transfers Overall transfer level: Needs assistance Equipment used: None Transfers: Sit to/from Stand, Bed to chair/wheelchair/BSC Sit to  Stand: Contact guard assist Stand pivot transfers: Contact guard assist         General transfer comment: Pt grabbed his system controller and stood up prior to PT cuing pt and he took a few pivotal steps to the recliner from the bed. Pt would not wait for PT to get chair ready and was annoyed that PT was asking him to get OOB. Impulsive with movement and not waiting for directional cues. Pt refused to do more than get into chair today.  Did not appear unsteady on his feet today.    Ambulation/Gait                  Stairs            Wheelchair Mobility     Tilt Bed    Modified Rankin (Stroke Patients Only)       Balance                                             Pertinent Vitals/Pain Pain Assessment Pain Assessment: Faces Faces Pain Scale: Hurts whole lot Pain Location: abdomen Pain Descriptors / Indicators: Aching, Grimacing, Guarding Pain Intervention(s): Limited activity within patient's tolerance, Monitored during session, Repositioned    Home Living Family/patient expects to be discharged to:: Private residence Living Arrangements: Spouse/significant other Available Help at Discharge: Family;Available 24 hours/day Type of Home: House Home Access: Ramped entrance  Home Layout: One level Home Equipment: Rollator (4 wheels)      Prior Function Prior Level of Function : Needs assist;Driving             Mobility Comments: walks without device in house and sits on porch       Extremity/Trunk Assessment   Upper Extremity Assessment Upper Extremity Assessment: Defer to OT evaluation    Lower Extremity Assessment Lower Extremity Assessment: Overall WFL for tasks assessed    Cervical / Trunk Assessment Cervical / Trunk Assessment: Normal  Communication   Communication Communication: No apparent difficulties  Cognition Arousal: Alert Behavior During Therapy: WFL for tasks assessed/performed Overall Cognitive  Status: Within Functional Limits for tasks assessed                                 General Comments: Poor safety awareness        General Comments General comments (skin integrity, edema, etc.): HR 108 bpm, LVAD 5600 speed, 4.1 flow, 7.6 PI    Exercises     Assessment/Plan    PT Assessment Patient needs continued PT services  PT Problem List Decreased activity tolerance;Decreased balance;Decreased mobility;Decreased knowledge of use of DME;Decreased safety awareness;Decreased knowledge of precautions;Cardiopulmonary status limiting activity;Pain       PT Treatment Interventions DME instruction;Gait training;Functional mobility training;Therapeutic activities;Therapeutic exercise;Balance training;Patient/family education    PT Goals (Current goals can be found in the Care Plan section)  Acute Rehab PT Goals Patient Stated Goal: to go home PT Goal Formulation: With patient Time For Goal Achievement: 10/08/23 Potential to Achieve Goals: Good    Frequency Min 1X/week     Co-evaluation               AM-PAC PT "6 Clicks" Mobility  Outcome Measure Help needed turning from your back to your side while in a flat bed without using bedrails?: None Help needed moving from lying on your back to sitting on the side of a flat bed without using bedrails?: None Help needed moving to and from a bed to a chair (including a wheelchair)?: A Little Help needed standing up from a chair using your arms (e.g., wheelchair or bedside chair)?: A Little Help needed to walk in hospital room?: A Little Help needed climbing 3-5 steps with a railing? : A Little 6 Click Score: 20    End of Session Equipment Utilized During Treatment: Gait belt Activity Tolerance: Patient limited by fatigue Patient left: in chair;with call bell/phone within reach;with chair alarm set;with family/visitor present Nurse Communication: Mobility status PT Visit Diagnosis: Muscle weakness (generalized)  (M62.81);Pain Pain - part of body:  (abdomen)    Time: 6578-4696 PT Time Calculation (min) (ACUTE ONLY): 13 min   Charges:   PT Evaluation $PT Eval Moderate Complexity: 1 Mod   PT General Charges $$ ACUTE PT VISIT: 1 Visit         Jeany Seville M,PT Acute Rehab Services [phone]   [name] 09/24/2023, 1:22 PM

## 2023-09-24 NOTE — Progress Notes (Signed)
  Subjective:  Patient examined, images of CT scan of abdomen personally reviewed. Patient admitted with symptoms of severe abdominal pain, headache, and elevated white count with elevated lactic acid.  He feels better this a.m. with improved appetite and less abdominal pain.  He has been having normal BMs.  Abdominal exam is benign. VAD parameters satisfactory with VAD flow 4.2 L  White count elevated 16 >>24k,  blood cultures pending urine culture pending. CT scan of abdomen shows no evidence of ischemic bowel, obstruction, or VAD tunnel abscess.  Chest x-ray is clear.  LFTs are satisfactory.  INR elevated at 3.2  VAD power cord exit site with minimal drainage, stable. Vital signs in last 24 hours: Temp:  [97.5 F (36.4 C)-98.7 F (37.1 C)] 97.8 F (36.6 C) (10/16 0736) Pulse Rate:  [97-109] 100 (10/16 0615) Cardiac Rhythm: Sinus tachycardia (10/16 0700) Resp:  [15-28] 17 (10/16 0736) BP: (75-173)/(0-131) 82/65 (10/16 0829) SpO2:  [97 %-100 %] 98 % (10/16 0736) Weight:  [84.2 kg-86.5 kg] 84.2 kg (10/16 0615)  Hemodynamic parameters for last 24 hours:  Sinus rhythm, afebrile  Intake/Output from previous day: 10/15 0701 - 10/16 0700 In: 480 [P.O.:480] Out: 400 [Urine:400] Intake/Output this shift: No intake/output data recorded.  EXAM Alert and appropriate, lying supine comfortably Normal VAD hum, lungs clear Abdomen soft nontender with normal bowel sounds Extremities warm without edema Neuro no focal deficit   Lab Results: Recent Labs    09/23/23 1055 09/23/23 1219 09/24/23 0800  WBC 16.0*  --  25.7*  HGB 13.8 11.9* 12.6*  HCT 40.7 35.0* 38.2*  PLT 378  --  294   BMET:  Recent Labs    09/23/23 1055 09/23/23 1219 09/24/23 0800  NA 136 138 131*  K 4.3 3.5 4.3  CL 96* 102 95*  CO2 21*  --  23  GLUCOSE 211* 196* 201*  BUN 22 20 25*  CREATININE 1.11 0.80 1.46*  CALCIUM 9.6  --  8.6*    PT/INR:  Recent Labs    09/24/23 0800  LABPROT 33.3*  INR  3.2*   ABG    Component Value Date/Time   PHART 7.508 (H) 11/20/2020 0438   HCO3 24.8 09/09/2021 0313   TCO2 21 (L) 09/23/2023 1219   ACIDBASEDEF 6.0 (H) 11/24/2019 1718   O2SAT 63.4 09/09/2021 0313   CBG (last 3)  Recent Labs    09/23/23 1733 09/23/23 2109 09/24/23 0613  GLUCAP 178* 173* 161*    Assessment/Plan: S/P  Readmission for abdominal pain with elevated markers for infection.  IV antibiotics started.  Cultures have been sent.  Start patient back on full liquid diet and follow clinical course.   LOS: 1 day    Lovett Sox 09/24/2023

## 2023-09-24 NOTE — Progress Notes (Signed)
PHARMACY - ANTICOAGULATION CONSULT NOTE  Pharmacy Consult for warfarin Indication:  HM3 LVAD  Allergies  Allergen Reactions   Firvanq [Vancomycin] Nausea And Vomiting and Other (See Comments)    Arthralgias     Patient Measurements: Height: 5\' 10"  (177.8 cm) Weight: 84.2 kg (185 lb 10 oz) IBW/kg (Calculated) : 73  Vital Signs: Temp: 97.8 F (36.6 C) (10/16 1100) Temp Source: Oral (10/16 1100) BP: 81/69 (10/16 1119) Pulse Rate: 107 (10/16 1119)  Labs: Recent Labs    09/23/23 1055 09/23/23 1219 09/24/23 0800  HGB 13.8 11.9* 12.6*  HCT 40.7 35.0* 38.2*  PLT 378  --  294  LABPROT 31.9*  --  33.3*  INR 3.1*  --  3.2*  CREATININE 1.11 0.80 1.46*    Estimated Creatinine Clearance: 54.2 mL/min (A) (by C-G formula based on SCr of 1.46 mg/dL (H)).   Medical History: Past Medical History:  Diagnosis Date   "    Arthritis    CAD (coronary artery disease)    a. s/p CABG in 11/2019 with LIMA-LAD, SVG-OM1, SVG-PDA and SVG-D1   CHF (congestive heart failure) (HCC)    a. EF < 20% by echo in 11/2019   Essential hypertension    PAF (paroxysmal atrial fibrillation) (HCC)    Type 2 diabetes mellitus (HCC)     Medications:  Medications Prior to Admission  Medication Sig Dispense Refill Last Dose   acetaminophen (TYLENOL) 325 MG tablet Take 2 tablets (650 mg total) by mouth every 4 (four) hours as needed for headache or mild pain.   09/22/2023   albuterol (PROVENTIL) (2.5 MG/3ML) 0.083% nebulizer solution INHALE 3 ML BY NEBULIZATION EVERY 6 HOURS AS NEEDED FOR WHEEZING OR SHORTNESS OF BREATH (Patient taking differently: Take 2.5 mg by nebulization every 6 (six) hours as needed for shortness of breath.) 450 mL 1 09/22/2023   albuterol (VENTOLIN HFA) 108 (90 Base) MCG/ACT inhaler Inhale 2 puffs into the lungs every 6 (six) hours as needed for wheezing or shortness of breath.   09/22/2023   allopurinol (ZYLOPRIM) 100 MG tablet Take 1 tablet (100 mg total) by mouth daily. 90 tablet 3  09/22/2023   amLODipine (NORVASC) 10 MG tablet Take 1 tablet (10 mg total) by mouth daily. 30 tablet 5 09/22/2023   ciprofloxacin (CIPRO) 750 MG tablet Take 1 tablet (750 mg total) by mouth 2 (two) times daily. 60 tablet 3 09/22/2023   colchicine 0.6 MG tablet Take 1 tablet (0.6 mg total) by mouth daily as needed (gout pain). (Patient taking differently: Take 0.6 mg by mouth daily.)   09/22/2023   COMBIVENT RESPIMAT 20-100 MCG/ACT AERS respimat Inhale 1 puff into the lungs every 6 (six) hours.   09/22/2023   fluticasone (FLONASE ALLERGY RELIEF) 50 MCG/ACT nasal spray Place 2 sprays into both nostrils as needed for allergies or rhinitis.   09/22/2023   gabapentin (NEURONTIN) 300 MG capsule Take 1 capsule (300 mg total) by mouth 3 (three) times daily. 90 capsule 0 09/22/2023   glipiZIDE (GLUCOTROL) 5 MG tablet Take 1 tablet (5 mg total) by mouth daily. 30 tablet 11 09/22/2023   guaifenesin (HUMIBID E) 400 MG TABS tablet Take 400 mg by mouth every 6 (six) hours as needed (for congestion).   09/22/2023   loratadine (CLARITIN) 10 MG tablet Take 1 tablet (10 mg total) by mouth daily. 30 tablet 6 09/22/2023   losartan (COZAAR) 50 MG tablet Take 1 tablet (50 mg total) by mouth daily. 30 tablet 5 09/22/2023   Menthol,  Topical Analgesic, (BIOFREEZE) 10 % CREA Apply 1 Application topically 2 (two) times daily as needed (Apply to all joints for pain).   09/22/2023   metFORMIN (GLUCOPHAGE) 1000 MG tablet Take 1 tablet (1,000 mg total) by mouth 2 (two) times daily. 120 tablet 6 09/22/2023   metoprolol succinate (TOPROL XL) 25 MG 24 hr tablet Take 1 tablet (25 mg total) by mouth daily. 30 tablet 5 09/22/2023   minocycline (MINOCIN) 100 MG capsule Take 2 capsules (200 mg total) by mouth 2 (two) times daily. 120 capsule 3 09/22/2023   mirtazapine (REMERON) 15 MG tablet Take 1 tablet (15 mg total) by mouth at bedtime.   Past Week   Multiple Vitamin (MULTIVITAMIN WITH MINERALS) TABS tablet Take 1 tablet by mouth daily.    09/22/2023   Oxycodone HCl 10 MG TABS Take 1 tablet (10 mg total) by mouth every 6 (six) hours as needed. 28 tablet 0 Past Month   pantoprazole (PROTONIX) 40 MG tablet Take 1 tablet (40 mg total) by mouth daily. 90 tablet 2 09/22/2023   rosuvastatin (CRESTOR) 20 MG tablet Take 1 tablet (20 mg total) by mouth daily. 30 tablet 5 09/22/2023   sertraline (ZOLOFT) 50 MG tablet Take 1 tablet (50 mg total) by mouth daily. (Patient taking differently: Take 50 mg by mouth at bedtime.) 90 tablet 3 Past Week   spironolactone (ALDACTONE) 25 MG tablet Take 0.5 tablets (12.5 mg total) by mouth daily. (Patient taking differently: Take 25 mg by mouth daily.) 30 tablet 5 09/22/2023   tamsulosin (FLOMAX) 0.4 MG CAPS capsule Take 1 capsule (0.4 mg total) by mouth daily. 90 capsule 3 09/22/2023   thiamine (VITAMIN B-1) 100 MG tablet Take 1 tablet (100 mg total) by mouth daily.   09/22/2023   traMADol (ULTRAM) 50 MG tablet Take 2 tablets (100 mg total) by mouth every 12 (twelve) hours as needed. 120 tablet 0 Past Week   traZODone (DESYREL) 150 MG tablet Take 1 tablet (150 mg total) by mouth at bedtime. 90 tablet 3 Past Month   TRELEGY ELLIPTA 100-62.5-25 MCG/ACT AEPB Inhale 1 puff into the lungs daily. 1 each 6 09/22/2023   warfarin (COUMADIN) 4 MG tablet Take 1-1.5 tablets (4-6 mg total) by mouth See admin instructions. Take 1 tablet (4 mg) every Tuesday and Thursday, Take 1.5 tabs (6 mg) all other days 45 tablet 11 09/22/2023 at 0800   Scheduled:   allopurinol  100 mg Oral Daily   gabapentin  300 mg Oral TID   losartan  50 mg Oral Daily   metoprolol succinate  25 mg Oral Daily   minocycline  200 mg Oral BID   mirtazapine  15 mg Oral QHS   rosuvastatin  20 mg Oral Daily   sertraline  50 mg Oral Daily   spironolactone  12.5 mg Oral Daily   tamsulosin  0.4 mg Oral Daily    Assessment: Kevin Underwood is a 62YOM who was admitted for abdominal pain and pain control. Patient has a HM3 LVAD and on warfarin PTA. INR  3.1, supratherapeutic on admission.   Warfarin PTA regimen: 4 mg T/Th and 6 mg EOD Last dose PTA: 10/14 AM  10/16: INR 3.2 today, supratherapeutic after holding warfarin yesterday. No signs of bleeding noted. CBC stable (Hgb 12.6, Plt 294).  Goal of Therapy:  INR 2.0-2.5 Monitor platelets by anticoagulation protocol: Yes   Plan:  Holding warfarin today Monitor INR, CBC, and bleeding daily  Enos Fling, PharmD PGY-1 Acute Care Pharmacy Resident 09/24/2023  1:21 PM

## 2023-09-24 NOTE — Progress Notes (Signed)
LVAD Coordinator Rounding Note:  Admitted 09/23/23 to Heart Failure service from [facility] with worsening abdominal pain and nausea x 2-3 days.   CT abd/pelvis 09/23/23 1. Mild fluid distension of left upper quadrant jejunal bowel loops without discrete transition point suggestive of mild ileus or enteritis. 2. Otherwise no CT evidence for acute intra-abdominal or pelvic abnormality. LVAD with similar skin thickening and circumferential soft tissue thickening about the drive line in the upper abdomen without development of new fluid around the drive line since the prior exam. 3. 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 Aortic atherosclerosis.  Pt laying in bed on my arrival. Abdominal pain improving with pain meds. Complaining of headache this morning.   WBC trending up to 26 today. 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 Blood cxs pending. ID team consulted today. Recommending adding IV Daptomycin 700 mg daily, IV Cefepime 2g q 8 hrs, and to continue PO Minocycline.    Vital signs: Temp: 97.8 HR: 110 Doppler Pressure: 72 Automatic BP: 82/65 (72) O2 Sat: 98% on RA Wt: 185.6 lbs  LVAD interrogation reveals:  Speed: 5600 Flow: 4.5 Power: 4.4 w PI: 2.9 Alarms: none Events: 38 PI events today Hct: 31  Fixed speed:  5600 Low speed limit:  5300  Drive Line: Existing VAD dressing removed and site care performed using sterile technique by Dr. Maren Beach. Wound bed cleansed with VASHE solution. Skin surrounding wound bed cleaned with Chlora prep applicators x 2, allowed to dry. Followed by VASHE soaked 2x2 flush to exit site and then covered with several dry 4 x 4s. Wound tunnels approx 3 cm.  Drive line partially incorporated. Small amount of thick yellow/serosanguinous drainage noted. No redness, tenderness, foul odor or rash noted.  Drive line anchor reapplied.  Ulcerated area distal to drive line cleansed with VASHE solution. VASHE moistened 2 x 2 placed over area and covered with gauze.  Continue daily wet to dry dressing changes using VASHE solution per bedside RN or VAD coordinator. Next dressing change due 09/25/23.     Pt had previous drive line debridement in OR with placement of instillation wound vac on 07/09/23 with Dr Donata Clay. This was changed to wet/dry on Friday 8/2. Pt unable to undergo further debridement due to risk of injury to the outflow graft as it is just above the drive line.    Labs:  LDH trend: 149  INR trend: 3.2  WBC trend: 16>26  Lactic acid trend: 3.6>2.6>3.3  Anticoagulation Plan: -INR Goal: 2.0 - 2.5 -ASA Dose: none  ICD: N/A  Infection:  09/23/23>>resp panel>>negative 09/23/23>> blood cxs>> no growth < 24 hrs  Drips:    Plan/Recommendations:  Page VAD coordinator with equipment issues or driveline problems Daily drive line wet to dry dressing changes with VASHE solution.   Alyce Pagan RN VAD Coordinator  Office: [phone]  24/7 Pager: [phone]

## 2023-09-24 NOTE — Addendum Note (Signed)
Encounter addended by: Flora Lipps, RN on: 09/24/2023 8:35 AM  Actions taken: Charge Capture section accepted

## 2023-09-24 NOTE — Progress Notes (Addendum)
Advanced Heart Failure VAD Team Note  PCP-Cardiologist: Nona Dell, MD   Subjective:    CT A/P 10/15: Possible mild ileus or enteritis, no acute intra-abdominal or pelvic abnormality, large abdominal wall hernia with no evidence of obstruction or incarceration  CT reviewed by Dr. Maren Beach, no further debridement needed  Abdominal pain improving with pain medications.  AF. WBC climbing, 16>>26K. BC X 2 pending.   BMET pending  Remains on chronic suppressive po cipro + po minocycline  MAP 70s (charted MAPs in 40s were erroneous) but SBP 80s  LVAD INTERROGATION:  HeartMate III LVAD:   Flow 3.6 liters/min, speed 5600, power 4, PI 5.2. > 100 PI events so far this am.   Objective:    Vital Signs:   Temp:  [97.8 F (36.6 C)-98.7 F (37.1 C)] 97.8 F (36.6 C) (10/16 0736) Pulse Rate:  [97-109] 100 (10/16 0615) Resp:  [15-28] 17 (10/16 0736) BP: (75-173)/(36-131) 75/36 (10/16 0736) SpO2:  [97 %-100 %] 98 % (10/16 0736) Weight:  [84.2 kg-86.2 kg] 84.2 kg (10/16 0615) Last BM Date : 09/22/23 Mean arterial Pressure 70s   Intake/Output:   Intake/Output Summary (Last 24 hours) at 09/24/2023 0809 Last data filed at 09/24/2023 1610 Gross per 24 hour  Intake 480 ml  Output 400 ml  Net 80 ml     Physical Exam    General:  Lying comfortably in bed HEENT: normal Neck: supple. JVP not elevated .  Cor: Mechanical heart sounds with LVAD hum present. Lungs: clear Abdomen: soft, nondistended, tender over abdominal wall hernia.  Driveline: C/D/I; securement device intact and driveline incorporated Extremities: no cyanosis, clubbing, rash, edema Neuro: alert & orientedx3. Affect pleasant   Telemetry   ST 100s-110s  Labs   Basic Metabolic Panel: Recent Labs  Lab 09/23/23 1055 09/23/23 1219 09/23/23 1719  NA 136 138  --   K 4.3 3.5  --   CL 96* 102  --   CO2 21*  --   --   GLUCOSE 211* 196*  --   BUN 22 20  --   CREATININE 1.11 0.80  --   CALCIUM 9.6  --    --   MG  --   --  1.8    Liver Function Tests: Recent Labs  Lab 09/23/23 1719  AST 22  ALT 25  ALKPHOS 141*  BILITOT 0.6  PROT 7.8  ALBUMIN 3.2*   Recent Labs  Lab 09/23/23 1719  LIPASE 50   No results for input(s): "AMMONIA" in the last 168 hours.  CBC: Recent Labs  Lab 09/23/23 1055 09/23/23 1219  WBC 16.0*  --   HGB 13.8 11.9*  HCT 40.7 35.0*  MCV 78.3*  --   PLT 378  --     INR: Recent Labs  Lab 09/23/23 1055  INR 3.1*    Other results: EKG:    Imaging   CT ABDOMEN PELVIS W CONTRAST  Result Date: 09/23/2023 CLINICAL DATA:  Nausea vomiting diarrhea EXAM: CT ABDOMEN AND PELVIS WITH CONTRAST TECHNIQUE: Multidetector CT imaging of the abdomen and pelvis was performed using the standard protocol following bolus administration of intravenous contrast. RADIATION DOSE REDUCTION: This exam was performed according to the departmental dose-optimization program which includes automated exposure control, adjustment of the mA and/or kV according to patient size and/or use of iterative reconstruction technique. CONTRAST:  75mL OMNIPAQUE IOHEXOL 350 MG/ML SOLN COMPARISON:  CT 09/08/2023, 07/07/2023, 01/27/2023, 06/14/2022 FINDINGS: Lower chest: Lung bases demonstrate no acute airspace disease. Artifact from  LVAD. Hepatobiliary: No focal liver abnormality is seen. No gallstones, gallbladder wall thickening, or biliary dilatation. Pancreas: Unremarkable. No pancreatic ductal dilatation or surrounding inflammatory changes. Spleen: Normal in size without focal abnormality. Adrenals/Urinary Tract: Adrenal glands are stable in appearance. Punctate nonobstructing left kidney stone. Subcentimeter hypodensities too small to further characterize, no imaging follow-up is recommended. No hydronephrosis. The bladder is unremarkable. Stomach/Bowel: Stomach is nondistended. Mild fluid distension of left upper quadrant jejunal bowel loops without transition point suggestive of mild ileus or  enteritis. No acute bowel wall thickening. Otherwise decompressed small bowel. Negative appendix Vascular/Lymphatic: Advanced aortic atherosclerosis. No aneurysm. No suspicious lymph nodes Reproductive: Negative prostate Other: Negative for pelvic effusion or free air. Diastasis of the anterior abdominal wall musculature with wide neck large hernia containing mesentery and bowel but no obstruction at this time. Residual soft tissue thickening about the drive line in the upper anterior abdomen without new fluid. Musculoskeletal: Posterior spinal hardware at the lumbosacral spine. Multilevel degenerative changes. No acute osseous abnormality IMPRESSION: 1. Mild fluid distension of left upper quadrant jejunal bowel loops without discrete transition point suggestive of mild ileus or enteritis. 2. Otherwise no CT evidence for acute intra-abdominal or pelvic abnormality. LVAD with similar skin thickening and circumferential soft tissue thickening about the drive line in the upper abdomen without development of new fluid around the drive line since the prior exam. 3. 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 Aortic atherosclerosis. Aortic Atherosclerosis (ICD10-I70.0). Electronically Signed   By: Jasmine Pang M.D.   On: 09/23/2023 17:12   DG Chest Portable 1 View  Result Date: 09/23/2023 CLINICAL DATA:  Shortness of breath. EXAM: PORTABLE CHEST 1 VIEW COMPARISON:  09/08/2023. FINDINGS: Similar cardiomediastinal silhouette. LVAD in similar location. Median sternotomy. Atrial appendage clip. No consolidation. No visible pleural effusions or pneumothorax. Remote left clavicular fracture. Polyarticular degenerative change. IMPRESSION: No evidence of acute cardiopulmonary disease. Electronically Signed   By: Feliberto Harts M.D.   On: 09/23/2023 15:27     Medications:     Scheduled Medications:  allopurinol  100 mg Oral Daily    amLODipine  10 mg Oral Daily   ciprofloxacin  750 mg Oral BID   losartan  50 mg Oral Daily   metoprolol succinate  25 mg Oral Daily   minocycline  200 mg Oral BID   mirtazapine  15 mg Oral QHS   rosuvastatin  20 mg Oral Daily   sertraline  50 mg Oral Daily   spironolactone  12.5 mg Oral Daily   tamsulosin  0.4 mg Oral Daily    Infusions:   PRN Medications: hydrALAZINE, HYDROmorphone (DILAUDID) injection, ondansetron (ZOFRAN) IV   Patient Profile   62 y.o. male with history of CAD s/p CABG, chronic systolic CHF s/p HM III VAD, chronic driveline infection, DM II, COPD, HTN, large abdominal hernia.  Presenting with severe abdominal pain X 2-3 days. Admitted for further workup and pain control.   Assessment/Plan:    Abdominal pain: -Severe X 3 days. Associated with nausea and vomiting. Etiology not certain.  -CT A/P 10/15: Possible mild ileus or enteritis, no acute intra-abdominal or pelvic abnormality, large abdominal wall hernia with no evidence of obstruction or incarceration  -CT reviewed by PVT. No further debridement planned, there would be high risk of entering outflow graft.  -Pain better controlled with PRN dilaudid -Lipase and LFTs negative. AF but white count trending up. WBCs 16>26K. Urine culture and [facility]  X 2 pending.  -Discussed antibiotic regimen with PharmD. He is on chronic suppressive minocycline and ciprofloxacin. Wound culture 10/01 grew MRSA while on abx therapy.  I am concerned that he may need IV antibiotics. Plan to review further with ID who is familiar with his history.  -Clear liquid diet for now  2. 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  Cultures have grown acinetobacter, MRSA and pseudomonas He was readmitted with driveline infection in 2/24, completed course of daptomycin for MRSA.  Discharged from the hospital on 09/04/2023 with recurrent driveline  infection growing Pseudomonas.   On chronic minocycline and ciprofloxacin at home as above.  WC 10/01 grew MRSA. Driveline appeared stable on CT A/P yesterday. Discussing abx with ID d/t rising white count and persistent pain   2.  Chronic systolic CHF s/p Heartmate 3 LVAD on 11/17/20 - Not volume overloaded. Received IVF yesterday. Poor PO intake recently. - MAPs improved with pain control and resuming home medications.  - Hold amlodipine for now - Continue losartan 50 mg daily - Continue spiro 12.5 mg daily - Continue metoprolol xl 25 mg daily - Warfarin held. INR 3.2. INR goal 2-2.5.   3. 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  - BMET pending   4.  CAD S/p CABG 12/20 - Continue Crestor 20 mg daily.    5.  Paroxysmal atrial fibrillation status post maze and left atrial appendage clip - SR this admission - Off amiodarone with hyperthyroidism.   6. Type 2 diabetes -SSI   7. Hx methamphetamine abuse:  - UDS negative   8. 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  Followed by endocrinology. He is now off amiodarone.  - Continue methimazole     9. Anemia (chronic blood loss):  - Hgb stable 12.6   10. COPD: Prior smoker.  - Continue Trelegy.    11. PVCs/NSVT: Now off amiodarone. Continue metoprolol.   I reviewed the LVAD parameters from today, and compared the results to the patient's prior recorded data.  No programming changes were made.  The LVAD is functioning within specified parameters.  The patient performs LVAD self-test daily.  LVAD interrogation was negative for any significant power changes, alarms or PI events/speed drops.  LVAD equipment check completed and is in good working order.  Back-up equipment present.   LVAD education done on emergency procedures and precautions and reviewed exit site care.  Length of Stay: 1  FINCH, LINDSAY N, PA-C 09/24/2023, 8:09 AM  VAD Team --- VAD ISSUES ONLY--- Pager  [phone] (7am - 7am)  Advanced Heart Failure Team  Pager [phone] (M-F; 7a - 5p)  Please contact [facility] for night-coverage after hours (5p -7a ) and weekends on [email or web address]  Patient seen and examined with the above-signed Advanced Practice Provider and/or Housestaff. I personally reviewed laboratory data, imaging studies and relevant notes. I independently examined the patient and formulated the important aspects of the plan. I have edited the note to reflect any of my changes or salient points. I have personally discussed the plan with the patient and/or family.  Remains on IV abx. Ab pain improving. CT non-acute.   Bcx pending  ID and TCTS have seen.   General:  NAD.  HEENT: normal  Neck: supple. JVP not elevated.  Carotids 2+ bilat; no bruits. No lymphadenopathy or thryomegaly appreciated. Cor: LVAD hum.  Lungs: Clear. Abdomen: obese soft,  nontender, non-distended. No hepatosplenomegaly. No bruits or masses. Good bowel sounds. Large incisional hernia. Driveline site dressing clean. Anchor in place.  Extremities: no cyanosis, clubbing, rash. Warm no edema  Neuro: alert & oriented x 3. No focal deficits. Moves all 4 without problem   Case d/w ID and TCTS.Continue IV abx. CT reassuring. Continue to follow WBC.   VAD interrogated personally. Parameters stable.  INR 3.2. Discussed warfarin dosing with PharmD personally.  Arvilla Meres, MD  12:57 PM

## 2023-09-24 NOTE — Plan of Care (Signed)
  Problem: Education: Goal: Patient will understand all VAD equipment and how it functions Outcome: Progressing   Problem: Cardiac: Goal: LVAD will function as expected and patient will experience no clinical alarms Outcome: Progressing   Problem: Education: Goal: Knowledge of General Education information will improve Description: Including pain rating scale, medication(s)/side effects and non-pharmacologic comfort measures Outcome: Progressing   Problem: Clinical Measurements: Goal: Diagnostic test results will improve Outcome: Progressing   Problem: Activity: Goal: Risk for activity intolerance will decrease Outcome: Progressing   Problem: Nutrition: Goal: Adequate nutrition will be maintained Outcome: Progressing   Problem: Elimination: Goal: Will not experience complications related to bowel motility Outcome: Progressing Goal: Will not experience complications related to urinary retention Outcome: Progressing   Problem: Pain Managment: Goal: General experience of comfort will improve Outcome: Progressing   Problem: Safety: Goal: Ability to remain free from injury will improve Outcome: Progressing

## 2023-09-24 NOTE — Discharge Summary (Signed)
Advanced Heart Failure Team  Discharge Summary   Patient ID: Kevin Underwood. MRN: [id number], DOB/AGE: [birth year] 62 y.o. Admit date: 09/23/2023 D/C date:     10/02/2023   Primary Discharge Diagnoses:  Abdominal pain Chronic systolic CHF s/p HM III LVAD Recurrent driveline infection AKI   Hospital Course:  62 y.o. 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 type 2 diabetes mellitus, COPD, and hypertension. s/p Heartmate 3 LVAD.    Has history of chronic driveline infections, most recently in July 2024. At that time, wound cultures grew MSSA and Pseudomonas. Completed long course of IV abx. Now on chronic suppressive PO antibiotics.   Admitted 09/08/23 with possible early sepsis. On arrival wheezing and short of breath. Placed on IV antibiotics. CT abd/pelvis unchanged from previous. Blood cultures negative x2 days. CT surgery consulted. Driveline wound was improving. Pan to continue conservative wound care. Viral source felt to be the culprit. WBC trended down with improvement.    He presented to VAD clinic 09/23/23 with complaints of worsening abdominal pain at site of his abdominal hernia, nausea and "dry heaving" X 2 days. Admitted for further workup and pain control. CT A/P with no evidence of ischemic bowel, obstruction or worsening driveline infection. Abdominal exam was benign. He had elevated lactic acid and elevated white count with concern for infection. Blood cultures and urine culture sent. He was seen by TCTS and ID. Cipro switched to daptomycin and cefepime to cover MRSA and Pseudomonas, kept on minocycline to cover Acinetobacter. Completed 1 week of IV cefepime/dapto. Back on minocycline and cirpo at discharge. Stay prolonged additional day d/t uncontrolled pain. Stable at discharge on current regimen  (lidocaine patch, PRN dilaudid, and exalgo 8 mg daily). Unfortunately pain clinic unable to see him until 11/20 but he will be back to VAD clinic in 10 days for further pain management assessment along with regular post-hosp f/u.   Pt will continue to be followed closely in the VAD/HF clinic. Dr Elwyn Lade evaluated and deemed appropriate for discharge.   See below for detailed problem list: 1. Abdominal pain with n/v:  Associated with nausea and vomiting. CT A/P 10/15 showed possible mild ileus or enteritis, no evidence of ischemic bowel, obstruction, or VAD tunnel abscess.  Lipase and LFTs negative. Lactic acid elevated at 3.6 on admit but cleared down to 1.5. WBCs down w/ IV abx, 26>>10k-> 9k. Urine culture and BC NGTD.  Has been seen by TCTS and ID.  Suspect presentation is due to ileus + acute on chronic driveline infection. - Cipro switched to daptomycin and cefepime due to poor po intake to cover MRSA and Pseudomonas, kept on minocycline to cover Acinetobacter.  - Intolerant abdominal binder. Had BM today. No pain.  - Palliative care team consulted for pain control. Started on 8mg  daily exalgo, 2 mg PRN dilaudid Q6, +lidocaine patch. Happier with this pain regimen.  2. 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  Cultures have grown acinetobacter, MRSA and pseudomonas He was readmitted with driveline infection in 2/24, completed course of daptomycin for MRSA.  Discharged from the hospital on 09/04/2023 with recurrent driveline infection growing Pseudomonas.   On chronic minocycline and ciprofloxacin at home as above.  Wound culture 10/1 grew MRSA. Reports compliance with home regimen. Driveline  appeared stable on CT A/P this admit. Suspect persistent indolent driveline infection.  - per ID,  chronic ciprofloxacin and minocycline at discharge.  - Completed 7 days IV abx (cefepime/daptomycin)  2.  Chronic  systolic CHF s/p Heartmate 3 LVAD on 11/17/20. Echo this admit LVAD cannula in apex. Interventricular septum is midline. LVEF 20-25%, RV moderately reduced.  Received IVF on admit due to poor po intake with ileus. - INR goal 2 - 2.5. INR 1.7 . Back on home regimen and appetite is improving.  - LDH stable.  - Continue amlodipine 10 mg daily - Continue losartan 50 mg daily  - Continue spiro to 25 mg daily.  - Continue metoprolol xl 25 mg daily 3. 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  AKI in setting of infection, elevated lactic acid and suspected volume depletion. Improved with IVF.  - Resolved.  4.  CAD S/p CABG 12/20 - Continue Crestor 20 mg daily.  5.  Paroxysmal atrial fibrillation status post maze and left atrial appendage clip. NSR this admission. - Off amiodarone with hyperthyroidism. 6. Type 2 diabetes -SSI 7. Hx methamphetamine abuse:  - UDS negative 8. 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  Followed by endocrinology. He is now off amiodarone and methimazole.  9. Anemia (chronic blood loss):   - Hgb stable.  10. COPD: Prior smoker.  - On trelegy at home 11. Ventral hernia: Large ventral hernia, soft and nontender.  Does not appear incarcerated.  No bowel obstruction on 09/23/23 abdominal CT.  Not candidate for surgery due to proximity of hernia to driveline tunnel. Intolerant abdominal binder.    LVAD Interrogation HM II:   Speed: 5600   Flow: 4.2  PI: 5.2  Power: 4.1   Back-up speed: 5300      Discharge Weight Range: 195.11 lbs Discharge Vitals: Blood pressure (!) 108/93, pulse 80, temperature 97.9 F (36.6 C), temperature source Oral, resp. rate 18, height 5\' 10"  (1.778 m), weight 88.5 kg, SpO2 95%.  Labs: Lab Results  Component Value Date   WBC 9.1 10/01/2023   HGB 11.0 (L) 10/01/2023   HCT 33.7 (L) 10/01/2023   MCV 78.2 (L) 10/01/2023   PLT 233 10/01/2023    Recent Labs  Lab 10/01/23 0620  NA 131*   K 4.2  CL 98  CO2 23  BUN 18  CREATININE 0.83  CALCIUM 8.8*  GLUCOSE 218*   Lab Results  Component Value Date   CHOL 175 07/09/2023   HDL 45 07/09/2023   LDLCALC 91 07/09/2023   TRIG 193 (H) 07/09/2023   BNP (last 3 results) Recent Labs    09/08/23 1940  BNP 57.1    ProBNP (last 3 results) No results for input(s): "PROBNP" in the last 8760 hours.   Diagnostic Studies/Procedures   No results found.  Discharge Medications   Allergies as of 10/02/2023       Reactions   Firvanq [vancomycin] Nausea And Vomiting, Other (See Comments)   Arthralgias         Medication List     STOP taking these medications    guaifenesin 400 MG Tabs tablet Commonly known as: HUMIBID E   loratadine 10 MG tablet Commonly known as: CLARITIN   Oxycodone HCl 10 MG Tabs   traMADol 50 MG tablet Commonly known as: ULTRAM       TAKE these medications    acetaminophen 325 MG tablet Commonly known as: TYLENOL Take 2 tablets (650 mg total)  by mouth every 4 (four) hours as needed for headache or mild pain.   albuterol 108 (90 Base) MCG/ACT inhaler Commonly known as: VENTOLIN HFA Inhale 2 puffs into the lungs every 6 (six) hours as needed for wheezing or shortness of breath. What changed: Another medication with the same name was changed. Make sure you understand how and when to take each.   albuterol (2.5 MG/3ML) 0.083% nebulizer solution Commonly known as: PROVENTIL INHALE 3 ML BY NEBULIZATION EVERY 6 HOURS AS NEEDED FOR WHEEZING OR SHORTNESS OF BREATH What changed: See the new instructions.   allopurinol 100 MG tablet Commonly known as: ZYLOPRIM Take 1 tablet (100 mg total) by mouth daily.   amLODipine 10 MG tablet Commonly known as: NORVASC Take 1 tablet (10 mg total) by mouth daily.   Biofreeze 10 % Crea Generic drug: Menthol (Topical Analgesic) Apply 1 Application topically 2 (two) times daily as needed (Apply to all joints for pain).   ciprofloxacin 750 MG  tablet Commonly known as: CIPRO Take 1 tablet (750 mg total) by mouth 2 (two) times daily.   colchicine 0.6 MG tablet Take 1 tablet (0.6 mg total) by mouth daily as needed (gout pain). What changed: when to take this   Combivent Respimat 20-100 MCG/ACT Aers respimat Generic drug: Ipratropium-Albuterol Inhale 1 puff into the lungs every 6 (six) hours.   Flonase Allergy Relief 50 MCG/ACT nasal spray Generic drug: fluticasone Place 2 sprays into both nostrils as needed for allergies or rhinitis.   gabapentin 300 MG capsule Commonly known as: NEURONTIN Take 1 capsule (300 mg total) by mouth 3 (three) times daily.   glipiZIDE 5 MG tablet Commonly known as: Glucotrol Take 1 tablet (5 mg total) by mouth daily.   HYDROmorphone 2 MG tablet Commonly known as: DILAUDID Take 1 tablet (2 mg total) by mouth every 8 (eight) hours as needed for up to 10 days for severe pain (pain score 7-10).   HYDROmorphone HCl 8 MG Tb24 Commonly known as: EXALGO Take 1 tablet (8 mg total) by mouth daily. Start taking on: October 03, 2023   lidocaine 5 % Commonly known as: LIDODERM Place 1 patch onto the skin daily. Remove & Discard patch within 12 hours or as directed by MD   losartan 50 MG tablet Commonly known as: COZAAR Take 1 tablet (50 mg total) by mouth daily.   metFORMIN 1000 MG tablet Commonly known as: GLUCOPHAGE Take 1 tablet (1,000 mg total) by mouth 2 (two) times daily.   metoprolol succinate 25 MG 24 hr tablet Commonly known as: Toprol XL Take 1 tablet (25 mg total) by mouth daily.   minocycline 100 MG capsule Commonly known as: MINOCIN Take 2 capsules (200 mg total) by mouth 2 (two) times daily.   mirtazapine 15 MG tablet Commonly known as: REMERON Take 1 tablet (15 mg total) by mouth at bedtime.   multivitamin with minerals Tabs tablet Take 1 tablet by mouth daily.   pantoprazole 40 MG tablet Commonly known as: PROTONIX Take 1 tablet (40 mg total) by mouth daily.    rosuvastatin 20 MG tablet Commonly known as: CRESTOR Take 1 tablet (20 mg total) by mouth daily.   senna-docusate 8.6-50 MG tablet Commonly known as: Senokot-S Take 2 tablets by mouth at bedtime.   sertraline 50 MG tablet Commonly known as: ZOLOFT Take 1 tablet (50 mg total) by mouth daily. What changed: when to take this   spironolactone 25 MG tablet Commonly known as: ALDACTONE Take 1 tablet (25  mg total) by mouth daily.   tamsulosin 0.4 MG Caps capsule Commonly known as: FLOMAX Take 1 capsule (0.4 mg total) by mouth daily.   thiamine 100 MG tablet Commonly known as: Vitamin B-1 Take 1 tablet (100 mg total) by mouth daily.   traZODone 150 MG tablet Commonly known as: DESYREL Take 1 tablet (150 mg total) by mouth at bedtime.   Trelegy Ellipta 100-62.5-25 MCG/ACT Aepb Generic drug: Fluticasone-Umeclidin-Vilant Inhale 1 puff into the lungs daily.   warfarin 4 MG tablet Commonly known as: COUMADIN Take as directed. If you are unsure how to take this medication, talk to your nurse or doctor. Original instructions: Take 1-1.5 tablets (4-6 mg total) by mouth See admin instructions. Take 1.5 tablets (6 mg) every Wednesday, Take 1 tablet (4 mg) all other days or as directed by Heart Failure clinic What changed: additional instructions        Disposition   The patient will be discharged in stable condition to home.   Follow-up Information     [name], FNP. Go to.   Specialty: Family Medicine Why: Hospital follow up appointment scheduled for Wednesday, October 22, 2023 at 1:00 PM.  PLEASE ARRIVE 10-15 minutes early.  PLEASE call and cancel/reschedule if you CANNOT make appointment. Contact information: [address] [place] [address] [phone]         [facility] Follow up on 10/13/2023.   Specialty: Cardiology Why: 10/13/23 11 am Contact information: [address] [place]  [address] [phone]                  Duration of Discharge Encounter: Greater than 35 minutes   Signed, Alen Bleacher AGACNP-BC  10/02/2023, 1:37 PM

## 2023-09-24 NOTE — Progress Notes (Addendum)
Pharmacy Antibiotic Note  Kevin Underwood. is a 62 y.o. 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 Pharmacy has been consulted for daptomycin and cefepime dosing. Noted patient was previously treated for a driveline infection in 06/2023, cultures grew acinetobacter, MSSA, and pseudomonas. Patient was treated with daptomycin, cefepime, and minocycline at that time. Patient was transitioned to a PO antibiotic maintenance regimen of minocycline + ciprofloxacin. Switching patient to IV antibiotics today due to WBC increasing up to 25.7.   Plan: Daptomycin 700 mg IV daily Cefepime 2 gm IV every 8 hours Minocycline 200 mg PO BID Check CK weekly on Thursdays Monitor renal function Monitor cultures and clinical signs of improvement F/u when appropriate to resume PO antibiotics  Height: 5\' 10"  (177.8 cm) Weight: 84.2 kg (185 lb 10 oz) IBW/kg (Calculated) : 73  Temp (24hrs), Avg:98 F (36.7 C), Min:97.8 F (36.6 C), Max:98.7 F (37.1 C)  Recent Labs  Lab 09/23/23 1055 09/23/23 1219 09/23/23 1316 09/23/23 1530 09/24/23 0800  WBC 16.0*  --   --   --  25.7*  CREATININE 1.11 0.80  --   --  1.46*  LATICACIDVEN  --   --  3.6* 2.6* 3.3*    Estimated Creatinine Clearance: 54.2 mL/min (A) (by C-G formula based on SCr of 1.46 mg/dL (H)).    Allergies  Allergen Reactions   Firvanq [Vancomycin] Nausea And Vomiting and Other (See Comments)    Arthralgias     Antimicrobials this admission: Ciprofloxacin PTA >> 10/15  Minocycline PTA >>  Daptomycin 10/16 >> Cefepime 10/16 >>   Microbiology results: 10/15 BCx: no growth <24 hours 10/15 Resp panel negative  Thank you for allowing pharmacy to be a part of this patient's care.  Enos Fling, PharmD PGY-1 Acute Care Pharmacy Resident 09/24/2023 3:30 PM

## 2023-09-24 NOTE — Plan of Care (Signed)
Problem: Education: Goal: Patient will understand all VAD equipment and how it functions Outcome: Progressing Goal: Patient will be able to verbalize current INR target range and antiplatelet therapy for discharge home Outcome: Progressing   Problem: Cardiac: Goal: LVAD will function as expected and patient will experience no clinical alarms Outcome: Progressing   Problem: Education: Goal: Knowledge of General Education information will improve Description: Including pain rating scale, medication(s)/side effects and non-pharmacologic comfort measures Outcome: Progressing   Problem: Health Behavior/Discharge Planning: Goal: Ability to manage health-related needs will improve Outcome: Progressing   Problem: Clinical Measurements: Goal: Ability to maintain clinical measurements within normal limits will improve Outcome: Progressing Goal: Will remain free from infection Outcome: Progressing Goal: Diagnostic test results will improve Outcome: Progressing Goal: Respiratory complications will improve Outcome: Progressing Goal: Cardiovascular complication will be avoided Outcome: Progressing   Problem: Activity: Goal: Risk for activity intolerance will decrease Outcome: Progressing   Problem: Nutrition: Goal: Adequate nutrition will be maintained Outcome: Progressing   Problem: Coping: Goal: Level of anxiety will decrease Outcome: Progressing   Problem: Elimination: Goal: Will not experience complications related to bowel motility Outcome: Progressing Goal: Will not experience complications related to urinary retention Outcome: Progressing   Problem: Pain Managment: Goal: General experience of comfort will improve Outcome: Progressing   Problem: Safety: Goal: Ability to remain free from injury will improve Outcome: Progressing   Problem: Skin Integrity: Goal: Risk for impaired skin integrity will decrease Outcome: Progressing   Problem: Education: Goal: Ability  to describe self-care measures that may prevent or decrease complications (Diabetes Survival Skills Education) will improve Outcome: Progressing Goal: Individualized Educational Video(s) Outcome: Progressing   Problem: Coping: Goal: Ability to adjust to condition or change in health will improve Outcome: Progressing   Problem: Fluid Volume: Goal: Ability to maintain a balanced intake and output will improve Outcome: Progressing   Problem: Health Behavior/Discharge Planning: Goal: Ability to identify and utilize available resources and services will improve Outcome: Progressing Goal: Ability to manage health-related needs will improve Outcome: Progressing   Problem: Metabolic: Goal: Ability to maintain appropriate glucose levels will improve Outcome: Progressing   Problem: Nutritional: Goal: Maintenance of adequate nutrition will improve Outcome: Progressing Goal: Progress toward achieving an optimal weight will improve Outcome: Progressing   Problem: Skin Integrity: Goal: Risk for impaired skin integrity will decrease Outcome: Progressing

## 2023-09-25 ENCOUNTER — Inpatient Hospital Stay (HOSPITAL_COMMUNITY): Payer: 59

## 2023-09-25 DIAGNOSIS — I38 Endocarditis, valve unspecified: Secondary | ICD-10-CM | POA: Diagnosis not present

## 2023-09-25 DIAGNOSIS — Z95811 Presence of heart assist device: Secondary | ICD-10-CM

## 2023-09-25 DIAGNOSIS — T827XXA Infection and inflammatory reaction due to other cardiac and vascular devices, implants and grafts, initial encounter: Secondary | ICD-10-CM | POA: Diagnosis not present

## 2023-09-25 LAB — CBC
HCT: 37.9 % — ABNORMAL LOW (ref 39.0–52.0)
Hemoglobin: 12.7 g/dL — ABNORMAL LOW (ref 13.0–17.0)
MCH: 26.1 pg (ref 26.0–34.0)
MCHC: 33.5 g/dL (ref 30.0–36.0)
MCV: 77.8 fL — ABNORMAL LOW (ref 80.0–100.0)
Platelets: 204 10*3/uL (ref 150–400)
RBC: 4.87 MIL/uL (ref 4.22–5.81)
RDW: 14.6 % (ref 11.5–15.5)
WBC: 10.2 10*3/uL (ref 4.0–10.5)
nRBC: 0 % (ref 0.0–0.2)

## 2023-09-25 LAB — LACTIC ACID, PLASMA
Lactic Acid, Venous: 2 mmol/L (ref 0.5–1.9)
Lactic Acid, Venous: 1.5 mmol/L (ref 0.5–1.9)

## 2023-09-25 LAB — BASIC METABOLIC PANEL
Anion gap: 16 — ABNORMAL HIGH (ref 5–15)
BUN: 35 mg/dL — ABNORMAL HIGH (ref 8–23)
CO2: 19 mmol/L — ABNORMAL LOW (ref 22–32)
Calcium: 8.4 mg/dL — ABNORMAL LOW (ref 8.9–10.3)
Chloride: 95 mmol/L — ABNORMAL LOW (ref 98–111)
Creatinine, Ser: 1.28 mg/dL — ABNORMAL HIGH (ref 0.61–1.24)
GFR, Estimated: >60 mL/min (ref >60)
Glucose, Bld: 214 mg/dL — ABNORMAL HIGH (ref 70–99)
Potassium: 4.3 mmol/L (ref 3.5–5.1)
Sodium: 130 mmol/L — ABNORMAL LOW (ref 135–145)

## 2023-09-25 LAB — URINE CULTURE: Culture: NO GROWTH

## 2023-09-25 LAB — GLUCOSE, CAPILLARY
Glucose-Capillary: 188 mg/dL — ABNORMAL HIGH (ref 70–99)
Glucose-Capillary: 184 mg/dL — ABNORMAL HIGH (ref 70–99)
Glucose-Capillary: 144 mg/dL — ABNORMAL HIGH (ref 70–99)
Glucose-Capillary: 255 mg/dL — ABNORMAL HIGH (ref 70–99)

## 2023-09-25 LAB — PROTIME-INR
INR: 2.9 — ABNORMAL HIGH (ref 0.8–1.2)
Prothrombin Time: 30.2 s — ABNORMAL HIGH (ref 11.4–15.2)

## 2023-09-25 LAB — ECHOCARDIOGRAM COMPLETE
Height: 70 in
S' Lateral: 3.9 cm
Weight: 2987.67 [oz_av]

## 2023-09-25 LAB — CK: Total CK: 33 U/L — ABNORMAL LOW (ref 49–397)

## 2023-09-25 LAB — LACTATE DEHYDROGENASE: LDH: 200 U/L — ABNORMAL HIGH (ref 98–192)

## 2023-09-25 MED ORDER — AMLODIPINE BESYLATE 5 MG PO TABS
5.0000 mg | ORAL_TABLET | Freq: Every day | ORAL | Status: DC
  Administered 2023-09-25 – 2023-09-28 (×4): 5 mg via ORAL
  Filled 2023-09-25 (×4): qty 1

## 2023-09-25 MED ORDER — LACTATED RINGERS IV BOLUS
250.0000 mL | Freq: Once | INTRAVENOUS | Status: AC
  Administered 2023-09-25: 250 mL via INTRAVENOUS

## 2023-09-25 MED ORDER — POLYETHYLENE GLYCOL 3350 17 G PO PACK
17.0000 g | PACK | Freq: Two times a day (BID) | ORAL | Status: DC
  Administered 2023-09-26 – 2023-10-02 (×12): 17 g via ORAL
  Filled 2023-09-25 (×14): qty 1

## 2023-09-25 NOTE — Progress Notes (Signed)
LVAD Coordinator Rounding Note:  Admitted 09/23/23 to Heart Failure service from [facility] with worsening abdominal pain and nausea x 2-3 days.   CT abd/pelvis 09/23/23 1. Mild fluid distension of left upper quadrant jejunal bowel loops without discrete transition point suggestive of mild ileus or enteritis. 2. Otherwise no CT evidence for acute intra-abdominal or pelvic abnormality. LVAD with similar skin thickening and circumferential soft tissue thickening about the drive line in the upper abdomen without development of new fluid around the drive line since the prior exam. 3. 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 Aortic atherosclerosis.  Pt laying in bed on my arrival. States he is feeling poorly this morning. Complaining of abdominal pain. Pain medication just recently given by bedside RN. Pt on clear liquid diet reporting poor intake. Bedside RN reports pt continues to have dark urine output. Upon VAD interrogation pt frequently dropping to low speed. Echocardiogram ordered this morning. Discussed with Anna Genre PA. Will give 250cc LR bolus and collect lactic.   WBC 10.2 today. 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 Blood cxs pending. ID team consulted today. Recommending adding IV Daptomycin 700 mg daily, IV Cefepime 2g q 8 hrs, and to continue PO Minocycline.    Vital signs: Temp: 98.7 HR: 110 Doppler Pressure: 96 Automatic BP: 104/82 (90) O2 Sat: 96% on RA Wt: 185.6>186.7 lbs  LVAD interrogation reveals:  Speed: 5300 (low speed) Flow: 4.3 Power: 5.0 w PI: 4.2 Alarms: none Events: >150 PI events today Hct: 31  Fixed speed:  5600 Low speed limit:  5300  Drive Line: Existing VAD dressing removed and site care performed using sterile technique by Dr. Maren Beach. Wound bed cleansed with VASHE solution. Skin surrounding wound bed cleaned with Chlora  prep applicators x 2, allowed to dry. Followed by VASHE soaked 2x2 flush to exit site and then covered with several dry 4 x 4s. Wound tunnels approx 3 cm.  Drive line partially incorporated. Small amount of thick yellow/serosanguinous drainage noted. No redness, tenderness, foul odor or rash noted. Drive line anchor reapplied on top of medipore tape to prevent skin breakdown. Ulcerated area distal to drive line cleansed with VASHE solution. VASHE moistened 2 x 2 placed over area and covered with gauze. Tegaderm dressing left off due to multiple skin tears around the dressing. Continue daily wet to dry dressing changes using VASHE solution per bedside RN or VAD coordinator. Next dressing change due 09/26/23.     Pt had previous drive line debridement in OR with placement of instillation wound vac on 07/09/23 with Dr Donata Clay. This was changed to wet/dry on Friday 8/2. Pt unable to undergo further debridement due to risk of injury to the outflow graft as it is just above the drive line.   Labs:  LDH trend: 149>200  INR trend: 3.2>2.9  WBC trend: 16>26>10.2  Lactic acid trend: 3.6>2.6>3.3>2.7>2.8  Anticoagulation Plan: -INR Goal: 2.0 - 2.5 -ASA Dose: none  ICD: N/A  Infection:  09/23/23>>resp panel>>negative 09/23/23>> blood cxs>> no growth < 24 hrs  Drips:    Plan/Recommendations:  Page VAD coordinator with equipment issues or driveline problems Daily drive line wet to dry dressing changes with VASHE solution.   Simmie Davies RN,BSN VAD Coordinator  Office: [phone]  24/7 Pager: [phone]

## 2023-09-25 NOTE — Progress Notes (Signed)
  Subjective: Patient examined and images of recent CT scan personally reviewed.  Patient feels poorly today complaining of posterior neck pain and headache, fatigue, and no appetite. He states his neck pain exceeds his abdominal pain. Although his white count has improved he remains with elevated lactic acid and LDH. Cultures are unrevealing.  Because of concern over his persistent, indolent symptoms and signs of infection he will have an echocardiogram to rule out endocarditis and a CT scan of the cervical spine to rule out discitis. His VAD tunnel wound will be recultured. Continue broad-spectrum IV antibiotics and follow. Objective: Vital signs in last 24 hours: Temp:  [97.8 F (36.6 C)-98.9 F (37.2 C)] 98.7 F (37.1 C) (10/17 1104) Pulse Rate:  [92-107] 100 (10/17 0305) Cardiac Rhythm: Sinus tachycardia (10/17 0850) Resp:  [15-19] 17 (10/17 1104) BP: (80-114)/(61-97) 112/97 (10/17 1104) SpO2:  [95 %-97 %] 97 % (10/17 1104) Weight:  [84.7 kg] 84.7 kg (10/17 0610)  Hemodynamic parameters for last 24 hours:  Sinus rhythm  Intake/Output from previous day: 10/16 0701 - 10/17 0700 In: 1266.1 [P.O.:960; IV Piggyback:306.1] Out: 725 [Urine:725] Intake/Output this shift: Total I/O In: -  Out: 825 [Urine:825]  Exam Patient supine in a dark room complaining of neck pain head pain and abdominal discomfort, no appetite and generalized malaise. Lungs clear Normal VAD hum No abdominal tenderness Minimal drainage from the VAD tunnel wound Neuro intact  Lab Results: Recent Labs    09/24/23 0800 09/25/23 0846  WBC 25.7* 10.2  HGB 12.6* 12.7*  HCT 38.2* 37.9*  PLT 294 204   BMET:  Recent Labs    09/24/23 1639 09/25/23 0846  NA 131* 130*  K 4.6 4.3  CL 96* 95*  CO2 22 19*  GLUCOSE 173* 214*  BUN 30* 35*  CREATININE 1.44* 1.28*  CALCIUM 8.4* 8.4*    PT/INR:  Recent Labs    09/25/23 0846  LABPROT 30.2*  INR 2.9*   ABG    Component Value Date/Time    PHART 7.508 (H) 11/20/2020 0438   HCO3 24.8 09/09/2021 0313   TCO2 21 (L) 09/23/2023 1219   ACIDBASEDEF 6.0 (H) 11/24/2019 1718   O2SAT 63.4 09/09/2021 0313   CBG (last 3)  Recent Labs    09/24/23 2111 09/25/23 0619 09/25/23 1102  GLUCAP 218* 188* 184*    Assessment/Plan: S/P  Continue current support for concerns over systemic infection and follow-up results of echocardiogram and CT of the cervical spine.   LOS: 2 days    Lovett Sox 09/25/2023

## 2023-09-25 NOTE — Plan of Care (Signed)
  Problem: Education: Goal: Patient will understand all VAD equipment and how it functions Outcome: Progressing Goal: Patient will be able to verbalize current INR target range and antiplatelet therapy for discharge home Outcome: Progressing   Problem: Cardiac: Goal: LVAD will function as expected and patient will experience no clinical alarms Outcome: Progressing   Problem: Health Behavior/Discharge Planning: Goal: Ability to manage health-related needs will improve Outcome: Progressing   Problem: Clinical Measurements: Goal: Ability to maintain clinical measurements within normal limits will improve Outcome: Progressing Goal: Respiratory complications will improve Outcome: Progressing   Problem: Safety: Goal: Ability to remain free from injury will improve Outcome: Progressing

## 2023-09-25 NOTE — Progress Notes (Signed)
PHARMACY - ANTICOAGULATION CONSULT NOTE  Pharmacy Consult for warfarin Indication:  HM3 LVAD  Allergies  Allergen Reactions   Firvanq [Vancomycin] Nausea And Vomiting and Other (See Comments)    Arthralgias     Patient Measurements: Height: 5\' 10"  (177.8 cm) Weight: 84.7 kg (186 lb 11.7 oz) IBW/kg (Calculated) : 73  Vital Signs: Temp: 98.7 F (37.1 C) (10/17 1104) Temp Source: Oral (10/17 1104) BP: 112/97 (10/17 1104) Pulse Rate: 100 (10/17 0305)  Labs: Recent Labs    09/23/23 1055 09/23/23 1219 09/24/23 0800 09/24/23 1639 09/25/23 0846  HGB 13.8 11.9* 12.6*  --  12.7*  HCT 40.7 35.0* 38.2*  --  37.9*  PLT 378  --  294  --  204  LABPROT 31.9*  --  33.3*  --  30.2*  INR 3.1*  --  3.2*  --  2.9*  CREATININE 1.11 0.80 1.46* 1.44* 1.28*  CKTOTAL  --   --   --   --  33*    Estimated Creatinine Clearance: 61.8 mL/min (A) (by C-G formula based on SCr of 1.28 mg/dL (H)).   Medical History: Past Medical History:  Diagnosis Date   "    Arthritis    CAD (coronary artery disease)    a. s/p CABG in 11/2019 with LIMA-LAD, SVG-OM1, SVG-PDA and SVG-D1   CHF (congestive heart failure) (HCC)    a. EF < 20% by echo in 11/2019   Essential hypertension    PAF (paroxysmal atrial fibrillation) (HCC)    Type 2 diabetes mellitus (HCC)     Medications:  Medications Prior to Admission  Medication Sig Dispense Refill Last Dose   acetaminophen (TYLENOL) 325 MG tablet Take 2 tablets (650 mg total) by mouth every 4 (four) hours as needed for headache or mild pain.   09/22/2023   albuterol (PROVENTIL) (2.5 MG/3ML) 0.083% nebulizer solution INHALE 3 ML BY NEBULIZATION EVERY 6 HOURS AS NEEDED FOR WHEEZING OR SHORTNESS OF BREATH (Patient taking differently: Take 2.5 mg by nebulization every 6 (six) hours as needed for shortness of breath.) 450 mL 1 09/22/2023   albuterol (VENTOLIN HFA) 108 (90 Base) MCG/ACT inhaler Inhale 2 puffs into the lungs every 6 (six) hours as needed for wheezing or  shortness of breath.   09/22/2023   allopurinol (ZYLOPRIM) 100 MG tablet Take 1 tablet (100 mg total) by mouth daily. 90 tablet 3 09/22/2023   amLODipine (NORVASC) 10 MG tablet Take 1 tablet (10 mg total) by mouth daily. 30 tablet 5 09/22/2023   ciprofloxacin (CIPRO) 750 MG tablet Take 1 tablet (750 mg total) by mouth 2 (two) times daily. 60 tablet 3 09/22/2023   colchicine 0.6 MG tablet Take 1 tablet (0.6 mg total) by mouth daily as needed (gout pain). (Patient taking differently: Take 0.6 mg by mouth daily.)   09/22/2023   COMBIVENT RESPIMAT 20-100 MCG/ACT AERS respimat Inhale 1 puff into the lungs every 6 (six) hours.   09/22/2023   fluticasone (FLONASE ALLERGY RELIEF) 50 MCG/ACT nasal spray Place 2 sprays into both nostrils as needed for allergies or rhinitis.   09/22/2023   gabapentin (NEURONTIN) 300 MG capsule Take 1 capsule (300 mg total) by mouth 3 (three) times daily. 90 capsule 0 09/22/2023   glipiZIDE (GLUCOTROL) 5 MG tablet Take 1 tablet (5 mg total) by mouth daily. 30 tablet 11 09/22/2023   guaifenesin (HUMIBID E) 400 MG TABS tablet Take 400 mg by mouth every 6 (six) hours as needed (for congestion).   09/22/2023  loratadine (CLARITIN) 10 MG tablet Take 1 tablet (10 mg total) by mouth daily. 30 tablet 6 09/22/2023   losartan (COZAAR) 50 MG tablet Take 1 tablet (50 mg total) by mouth daily. 30 tablet 5 09/22/2023   Menthol, Topical Analgesic, (BIOFREEZE) 10 % CREA Apply 1 Application topically 2 (two) times daily as needed (Apply to all joints for pain).   09/22/2023   metFORMIN (GLUCOPHAGE) 1000 MG tablet Take 1 tablet (1,000 mg total) by mouth 2 (two) times daily. 120 tablet 6 09/22/2023   metoprolol succinate (TOPROL XL) 25 MG 24 hr tablet Take 1 tablet (25 mg total) by mouth daily. 30 tablet 5 09/22/2023   minocycline (MINOCIN) 100 MG capsule Take 2 capsules (200 mg total) by mouth 2 (two) times daily. 120 capsule 3 09/22/2023   mirtazapine (REMERON) 15 MG tablet Take 1 tablet (15 mg  total) by mouth at bedtime.   Past Week   Multiple Vitamin (MULTIVITAMIN WITH MINERALS) TABS tablet Take 1 tablet by mouth daily.   09/22/2023   Oxycodone HCl 10 MG TABS Take 1 tablet (10 mg total) by mouth every 6 (six) hours as needed. 28 tablet 0 Past Month   pantoprazole (PROTONIX) 40 MG tablet Take 1 tablet (40 mg total) by mouth daily. 90 tablet 2 09/22/2023   rosuvastatin (CRESTOR) 20 MG tablet Take 1 tablet (20 mg total) by mouth daily. 30 tablet 5 09/22/2023   sertraline (ZOLOFT) 50 MG tablet Take 1 tablet (50 mg total) by mouth daily. (Patient taking differently: Take 50 mg by mouth at bedtime.) 90 tablet 3 Past Week   spironolactone (ALDACTONE) 25 MG tablet Take 0.5 tablets (12.5 mg total) by mouth daily. (Patient taking differently: Take 25 mg by mouth daily.) 30 tablet 5 09/22/2023   tamsulosin (FLOMAX) 0.4 MG CAPS capsule Take 1 capsule (0.4 mg total) by mouth daily. 90 capsule 3 09/22/2023   thiamine (VITAMIN B-1) 100 MG tablet Take 1 tablet (100 mg total) by mouth daily.   09/22/2023   traMADol (ULTRAM) 50 MG tablet Take 2 tablets (100 mg total) by mouth every 12 (twelve) hours as needed. 120 tablet 0 Past Week   traZODone (DESYREL) 150 MG tablet Take 1 tablet (150 mg total) by mouth at bedtime. 90 tablet 3 Past Month   TRELEGY ELLIPTA 100-62.5-25 MCG/ACT AEPB Inhale 1 puff into the lungs daily. 1 each 6 09/22/2023   warfarin (COUMADIN) 4 MG tablet Take 1-1.5 tablets (4-6 mg total) by mouth See admin instructions. Take 1 tablet (4 mg) every Tuesday and Thursday, Take 1.5 tabs (6 mg) all other days 45 tablet 11 09/22/2023 at 0800   Scheduled:   allopurinol  100 mg Oral Daily   amLODipine  5 mg Oral Daily   gabapentin  300 mg Oral TID   insulin aspart  0-15 Units Subcutaneous TID WC   metoprolol succinate  25 mg Oral Daily   minocycline  200 mg Oral BID   mirtazapine  15 mg Oral QHS   polyethylene glycol  17 g Oral Daily   rosuvastatin  20 mg Oral Daily   senna-docusate  2  tablet Oral QHS   sertraline  50 mg Oral Daily   spironolactone  12.5 mg Oral Daily   tamsulosin  0.4 mg Oral Daily    Assessment: Kevin Underwood is a 62YOM who was admitted for abdominal pain and pain control. Patient has a HM3 LVAD and on warfarin PTA. INR 3.1, supratherapeutic on admission.   Warfarin PTA regimen: 4  mg T/Th and 6 mg EOD Last dose PTA: 10/14 AM  10/17: INR 2.9 today, supratherapeutic after holding warfarin this admission. No signs of bleeding noted. CBC stable (Hgb 12.7, Plt 204).  Goal of Therapy:  INR 2.0-2.5 Monitor platelets by anticoagulation protocol: Yes   Plan:  Holding warfarin today Monitor INR, CBC, and bleeding daily  Kevin Underwood, PharmD PGY-1 Acute Care Pharmacy Resident 09/25/2023 1:25 PM

## 2023-09-25 NOTE — Progress Notes (Signed)
Subjective:  Kevin Underwood has continued to have abdominal pain and Kevin Underwood is brought to the attention the primary team that Kevin Underwood has had neck pain on and off over the last several months.   Antibiotics:  Anti-infectives (From admission, onward)    Start     Dose/Rate Route Frequency Ordered Stop   09/24/23 1100  ceFEPIme (MAXIPIME) 2 g in sodium chloride 0.9 % 100 mL IVPB        2 g 200 mL/hr over 30 Minutes Intravenous Every 8 hours 09/24/23 0953     09/24/23 1045  DAPTOmycin (CUBICIN) IVPB 700 mg/18mL premix        8 mg/kg  84.2 kg 200 mL/hr over 30 Minutes Intravenous Daily 09/24/23 0953     09/23/23 2200  minocycline (MINOCIN) capsule 200 mg        200 mg Oral 2 times daily 09/23/23 1615     09/23/23 2000  ciprofloxacin (CIPRO) tablet 750 mg  Status:  Discontinued        750 mg Oral 2 times daily 09/23/23 1615 09/24/23 0953       Medications: Scheduled Meds:  allopurinol  100 mg Oral Daily   amLODipine  5 mg Oral Daily   gabapentin  300 mg Oral TID   insulin aspart  0-15 Units Subcutaneous TID WC   metoprolol succinate  25 mg Oral Daily   minocycline  200 mg Oral BID   mirtazapine  15 mg Oral QHS   polyethylene glycol  17 g Oral Daily   rosuvastatin  20 mg Oral Daily   senna-docusate  2 tablet Oral QHS   sertraline  50 mg Oral Daily   spironolactone  12.5 mg Oral Daily   tamsulosin  0.4 mg Oral Daily   Continuous Infusions:  ceFEPime (MAXIPIME) IV 2 g (09/25/23 1052)   DAPTOmycin Stopped (09/24/23 1130)   PRN Meds:.hydrALAZINE, HYDROmorphone (DILAUDID) injection, ondansetron (ZOFRAN) IV    Objective: Weight change: -1.484 kg  Intake/Output Summary (Last 24 hours) at 09/25/2023 1338 Last data filed at 09/25/2023 1233 Gross per 24 hour  Intake 1266.11 ml  Output 1550 ml  Net -283.89 ml   Blood pressure (!) 112/97, pulse 100, temperature 98.7 F (37.1 C), temperature source Oral, resp. rate 17, height 5\' 10"  (1.778 m), weight 84.7 kg, SpO2 97%. Temp:  [97.8  F (36.6 C)-98.9 F (37.2 C)] 98.7 F (37.1 C) (10/17 1104) Pulse Rate:  [92-107] 100 (10/17 0305) Resp:  [15-19] 17 (10/17 1104) BP: (80-114)/(61-97) 112/97 (10/17 1104) SpO2:  [95 %-97 %] 97 % (10/17 1104) Weight:  [84.7 kg] 84.7 kg (10/17 0610)  Physical Exam: Physical Exam Constitutional:      Appearance: Kevin Underwood is well-developed. Kevin Underwood is obese.  HENT:     Head: Normocephalic and atraumatic.  Eyes:     Conjunctiva/sclera: Conjunctivae normal.  Cardiovascular:     Rate and Rhythm: Normal rate and regular rhythm.  Pulmonary:     Effort: Pulmonary effort is normal. No respiratory distress.     Breath sounds: No wheezing.  Abdominal:     General: There is distension.     Palpations: Abdomen is soft.     Tenderness: There is abdominal tenderness.     Hernia: A hernia is present.  Musculoskeletal:        General: Normal range of motion.     Cervical back: Normal range of motion and neck supple.  Skin:    General: Skin is warm and  dry.     Findings: No erythema or rash.  Neurological:     General: No focal deficit present.     Mental Status: Kevin Underwood is alert and oriented to person, place, and time.  Psychiatric:        Mood and Affect: Mood normal.        Behavior: Behavior normal.        Thought Content: Thought content normal.        Judgment: Judgment normal.      CBC:    BMET Recent Labs    09/24/23 1639 09/25/23 0846  NA 131* 130*  K 4.6 4.3  CL 96* 95*  CO2 22 19*  GLUCOSE 173* 214*  BUN 30* 35*  CREATININE 1.44* 1.28*  CALCIUM 8.4* 8.4*     Liver Panel  Recent Labs    09/23/23 1719 09/24/23 1639  PROT 7.8 6.6  ALBUMIN 3.2* 2.8*  AST 22 25  ALT 25 24  ALKPHOS 141* 125  BILITOT 0.6 0.4  BILIDIR 0.1  --   IBILI 0.5  --        Sedimentation Rate No results for input(s): "ESRSEDRATE" in the last 72 hours. C-Reactive Protein No results for input(s): "CRP" in the last 72 hours.  Micro Results: Recent Results (from the past 720 hour(s))   SARS Coronavirus 2 by RT PCR (hospital order, performed in [facility] hospital lab) *cepheid single result test* Anterior Nasal Swab     Status: None   Collection Time: 09/08/23  5:22 PM   Specimen: Anterior Nasal Swab  Result Value Ref Range Status   SARS Coronavirus 2 by RT PCR NEGATIVE NEGATIVE Final    Comment: Performed at [facility], [address] [place], [place] [address]  Culture, blood (routine x 2)     Status: None   Collection Time: 09/08/23  7:20 PM   Specimen: BLOOD LEFT ARM  Result Value Ref Range Status   Specimen Description BLOOD LEFT ARM  Final   Special Requests   Final    Blood Culture adequate volume BOTTLES DRAWN AEROBIC AND ANAEROBIC   Culture   Final    NO GROWTH 5 DAYS Performed at [facility], [address] [place], [place] [address]    Report Status 09/13/2023 FINAL  Final  Culture, blood (routine x 2)     Status: None   Collection Time: 09/08/23  9:08 PM   Specimen: BLOOD LEFT ARM  Result Value Ref Range Status   Specimen Description BLOOD LEFT ARM  Final   Special Requests   Final    BOTTLES DRAWN AEROBIC AND ANAEROBIC Blood Culture adequate volume   Culture   Final    NO GROWTH 5 DAYS Performed at [facility], [address] [place], [place] [address]    Report Status 09/13/2023 FINAL  Final  MRSA Next Gen by PCR, Nasal     Status: None   Collection Time: 09/08/23 11:51 PM   Specimen: Nasal Mucosa; Nasal Swab  Result Value Ref Range Status   MRSA by PCR Next Gen NOT DETECTED NOT DETECTED Final    Comment: (NOTE) The GeneXpert MRSA Assay (FDA approved for NASAL specimens only), is one component of a comprehensive MRSA colonization surveillance program. It is not intended to diagnose MRSA infection nor to guide or monitor treatment for MRSA infections. Test performance is not FDA approved in patients less than 77 years old. Performed at [facility], [address], [place], [place] [address]   Aerobic  Culture w Gram Stain (superficial specimen)     Status: None   Collection Time: 09/09/23 11:30 AM   Specimen: Wound  Result Value Ref Range Status   Specimen Description WOUND  Final   Special Requests abdomen  Final   Gram Stain   Final    NO WBC SEEN NO ORGANISMS SEEN Performed at [facility], [address] [place], [place] [address]    Culture   Final    MODERATE METHICILLIN RESISTANT STAPHYLOCOCCUS AUREUS   Report Status 09/11/2023 FINAL  Final   Organism ID, Bacteria METHICILLIN RESISTANT STAPHYLOCOCCUS AUREUS  Final      Susceptibility   Methicillin resistant staphylococcus aureus - MIC*    CIPROFLOXACIN 4 RESISTANT Resistant     ERYTHROMYCIN <=0.25 SENSITIVE Sensitive     GENTAMICIN <=0.5 SENSITIVE Sensitive     OXACILLIN >=4 RESISTANT Resistant     TETRACYCLINE <=1 SENSITIVE Sensitive     VANCOMYCIN <=0.5 SENSITIVE Sensitive     TRIMETH/SULFA >=320 RESISTANT Resistant     CLINDAMYCIN <=0.25 SENSITIVE Sensitive     RIFAMPIN <=0.5 SENSITIVE Sensitive     Inducible Clindamycin NEGATIVE Sensitive     LINEZOLID 2 SENSITIVE Sensitive     * MODERATE METHICILLIN RESISTANT STAPHYLOCOCCUS AUREUS  Respiratory (~20 pathogens) panel by PCR     Status: None   Collection Time: 09/09/23  3:12 PM   Specimen: Nasopharyngeal Swab; Respiratory  Result Value Ref Range Status   Adenovirus NOT DETECTED NOT DETECTED Final   Coronavirus 229E NOT DETECTED NOT DETECTED Final    Comment: (NOTE) The Coronavirus on the Respiratory Panel, DOES NOT test for the novel  Coronavirus (2019 nCoV)    Coronavirus HKU1 NOT DETECTED NOT DETECTED Final   Coronavirus NL63 NOT DETECTED NOT DETECTED Final   Coronavirus OC43 NOT DETECTED NOT DETECTED Final   Metapneumovirus NOT DETECTED NOT DETECTED Final   Rhinovirus / Enterovirus NOT DETECTED NOT DETECTED Final   Influenza A NOT DETECTED NOT DETECTED Final   Influenza B NOT DETECTED NOT DETECTED Final   Parainfluenza Virus 1 NOT DETECTED NOT  DETECTED Final   Parainfluenza Virus 2 NOT DETECTED NOT DETECTED Final   Parainfluenza Virus 3 NOT DETECTED NOT DETECTED Final   Parainfluenza Virus 4 NOT DETECTED NOT DETECTED Final   Respiratory Syncytial Virus NOT DETECTED NOT DETECTED Final   Bordetella pertussis NOT DETECTED NOT DETECTED Final   Bordetella Parapertussis NOT DETECTED NOT DETECTED Final   Chlamydophila pneumoniae NOT DETECTED NOT DETECTED Final   Mycoplasma pneumoniae NOT DETECTED NOT DETECTED Final    Comment: Performed at [facility], [address], [place], [place] [address]  Resp panel by RT-PCR (RSV, Flu A&B, Covid) Anterior Nasal Swab     Status: None   Collection Time: 09/23/23 12:45 PM   Specimen: Anterior Nasal Swab  Result Value Ref Range Status   SARS Coronavirus 2 by RT PCR NEGATIVE NEGATIVE Final   Influenza A by PCR NEGATIVE NEGATIVE Final   Influenza B by PCR NEGATIVE NEGATIVE Final    Comment: (NOTE) The Xpert Xpress SARS-CoV-2/FLU/RSV plus assay is intended as an aid in the diagnosis of influenza from Nasopharyngeal swab specimens and should not be used as a sole basis for treatment. Nasal washings and aspirates are unacceptable for Xpert Xpress SARS-CoV-2/FLU/RSV testing.  Fact Sheet for Patients: BloggerCourse.com  Fact Sheet for Healthcare Providers: SeriousBroker.it  This test is not yet approved or  cleared by the [place] and has been authorized for detection and/or diagnosis of SARS-CoV-2 by FDA under an Emergency Use Authorization (EUA). This EUA will remain in effect (meaning this test can be used) for the duration of the COVID-19 declaration under Section 564(b)(1) of the Act, 21 U.S.C. section 360bbb-3(b)(1), unless the authorization is terminated or revoked.     Resp Syncytial Virus by PCR NEGATIVE NEGATIVE Final    Comment: (NOTE) Fact Sheet for Patients: BloggerCourse.com  Fact Sheet  for Healthcare Providers: SeriousBroker.it  This test is not yet approved or cleared by the [place] FDA and has been authorized for detection and/or diagnosis of SARS-CoV-2 by FDA under an Emergency Use Authorization (EUA). This EUA will remain in effect (meaning this test can be used) for the duration of the COVID-19 declaration under Section 564(b)(1) of the Act, 21 U.S.C. section 360bbb-3(b)(1), unless the authorization is terminated or revoked.  Performed at [facility], [address] [place], [place] [address]   Culture, blood (Routine X 2) w Reflex to ID Panel     Status: None (Preliminary result)   Collection Time: 09/23/23  5:19 PM   Specimen: BLOOD RIGHT ARM  Result Value Ref Range Status   Specimen Description BLOOD RIGHT ARM  Final   Special Requests   Final    BOTTLES DRAWN AEROBIC ONLY Blood Culture results may not be optimal due to an inadequate volume of blood received in culture bottles   Culture   Final    NO GROWTH 2 DAYS Performed at [facility], [address], [place], [place] [address]    Report Status PENDING  Incomplete  Culture, blood (Routine X 2) w Reflex to ID Panel     Status: None (Preliminary result)   Collection Time: 09/23/23  5:19 PM   Specimen: BLOOD RIGHT ARM  Result Value Ref Range Status   Specimen Description BLOOD RIGHT ARM  Final   Special Requests   Final    BOTTLES DRAWN AEROBIC ONLY Blood Culture adequate volume   Culture   Final    NO GROWTH 2 DAYS Performed at [facility], [address], [place], [place] [address]    Report Status PENDING  Incomplete    Studies/Results: CT ABDOMEN PELVIS W CONTRAST  Result Date: 09/23/2023 CLINICAL DATA:  Nausea vomiting diarrhea EXAM: CT ABDOMEN AND PELVIS WITH CONTRAST TECHNIQUE: Multidetector CT imaging of the abdomen and pelvis was performed using the standard protocol following bolus administration of intravenous contrast. RADIATION  DOSE REDUCTION: This exam was performed according to the departmental dose-optimization program which includes automated exposure control, adjustment of the mA and/or kV according to patient size and/or use of iterative reconstruction technique. CONTRAST:  75mL OMNIPAQUE IOHEXOL 350 MG/ML SOLN COMPARISON:  CT 09/08/2023, 07/07/2023, 01/27/2023, 06/14/2022 FINDINGS: Lower chest: Lung bases demonstrate no acute airspace disease. Artifact from LVAD. Hepatobiliary: No focal liver abnormality is seen. No gallstones, gallbladder wall thickening, or biliary dilatation. Pancreas: Unremarkable. No pancreatic ductal dilatation or surrounding inflammatory changes. Spleen: Normal in size without focal abnormality. Adrenals/Urinary Tract: Adrenal glands are stable in appearance. Punctate nonobstructing left kidney stone. Subcentimeter hypodensities too small to further characterize, no imaging follow-up is recommended. No hydronephrosis. The bladder is unremarkable. Stomach/Bowel: Stomach is nondistended. Mild fluid distension of left upper quadrant jejunal bowel loops without transition point suggestive of mild ileus or enteritis. No acute bowel wall thickening. Otherwise decompressed small bowel. Negative appendix Vascular/Lymphatic: Advanced aortic atherosclerosis. No aneurysm.  No suspicious lymph nodes Reproductive: Negative prostate Other: Negative for pelvic effusion or free air. Diastasis of the anterior abdominal wall musculature with wide neck large hernia containing mesentery and bowel but no obstruction at this time. Residual soft tissue thickening about the drive line in the upper anterior abdomen without new fluid. Musculoskeletal: Posterior spinal hardware at the lumbosacral spine. Multilevel degenerative changes. No acute osseous abnormality IMPRESSION: 1. Mild fluid distension of left upper quadrant jejunal bowel loops without discrete transition point suggestive of mild ileus or enteritis. 2. Otherwise no CT  evidence for acute intra-abdominal or pelvic abnormality. LVAD with similar skin thickening and circumferential soft tissue thickening about the drive line in the upper abdomen without development of new fluid around the drive line since the prior exam. 3. 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 Aortic atherosclerosis. Aortic Atherosclerosis (ICD10-I70.0). Electronically Signed   By: Jasmine Pang M.D.   On: 09/23/2023 17:12      Assessment/Plan:  INTERVAL HISTORY: wBC normalized   Principal Problem:   Abdominal pain    Kevin Dunnaway. is a 62 y.o. 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  Blood cultures were taken on admission and are without growth urine cultures were taken though this should not have been as Kevin Underwood is not a pregnant woman or someone getting ready to have a urological procedure and has no urinary symptoms  #1 Leukocytosis:  Kevin Underwood suspect this is from his ileus and is now normal overnight  Kevin Underwood think Kevin Underwood needs supportive care for this  #2 LVAD infection: Chronic issue but Kevin Underwood is currently on daptomycin and cefepime along with oral minocycline.  As Kevin Underwood improves and gets ready for discharge would switch him back over to his chronic ciprofloxacin and minocycline  #3 Neck pain: CT neck not unreasonable but Kevin Underwood am skeptical we will find anything though Kevin Underwood could have seeded the C spine. IR will not sample things in the C spine and Kevin Underwood am skeptical l NS would do any surgery on him either but if we found a diskitis we could certainly attempt to treat with antibitics though Kevin Underwood would not be treating with IV antibiotics at home if  those were the ones we decided to use.   The patient Kevin Underwood understand is worried about systemic infection and is asking for Kevin Underwood to look for it in these areas and this is not unreasonable.   Kevin Underwood will followup on his CT neck and echocardiogram but  if they are normal Kevin Underwood will sign off with recommendation to go back to his po suppressive regimen at DC  Kevin Underwood have personally spent 54 minutes involved in face-to-face and non-face-to-face activities for this patient on the day of the visit. Professional time spent includes the following activities: Preparing to see the patient (review of tests), Obtaining and/or reviewing separately obtained history (admission/discharge record), Performing a medically appropriate examination and/or evaluation , Ordering medications/tests/procedures, referring and communicating with other health care professionals, Documenting clinical information in the EMR, Independently interpreting results (not separately reported), Communicating results to the patient/family/caregiver, Counseling and educating the patient/family/caregiver and Care coordination (not separately reported).       LOS: 2 days   Remi Haggard  Daiva Eves 09/25/2023, 1:38 PM

## 2023-09-25 NOTE — Progress Notes (Addendum)
Advanced Heart Failure VAD Team Note  PCP-Cardiologist: Nona Dell, MD   Subjective:    CT A/P 10/15: Possible mild ileus or enteritis, no evidence of ischemic bowel, obstruction, or VAD tunnel abscess   AF. WBC rising, 16>>26K>>AM lab pending  Home cipro switched to cefepime and daptomycin, remains on po minocycline  Starting to feel hungry, tolerating clear liquids. Pain well-controlled. No subjective fevers.  LVAD INTERROGATION:  HeartMate III LVAD:   Flow 4.1 liters/min, speed 5600, power 4, PI 6.8. > 100 PI events so far this am w/ drops in speed to low limit  Objective:    Vital Signs:   Temp:  [97.8 F (36.6 C)-98.9 F (37.2 C)] 98.7 F (37.1 C) (10/17 0728) Pulse Rate:  [92-107] 100 (10/17 0305) Resp:  [15-19] 16 (10/17 0728) BP: (80-114)/(61-92) 104/82 (10/17 0728) SpO2:  [95 %-98 %] 96 % (10/17 0728) Weight:  [84.7 kg] 84.7 kg (10/17 0610) Last BM Date : 09/22/23 Mean arterial Pressure 80s-90s  Intake/Output:   Intake/Output Summary (Last 24 hours) at 09/25/2023 0736 Last data filed at 09/25/2023 0611 Gross per 24 hour  Intake 1266.11 ml  Output 725 ml  Net 541.11 ml     Physical Exam    Physical Exam: GENERAL: Lying in bed. No distress. HEENT: normal  NECK: Supple, JVP not elevated.   CARDIAC:  Mechanical heart sounds with LVAD hum present.  LUNGS:  Clear to auscultation bilaterally.  ABDOMEN:  Soft, round, nondistended, large abdominal wall hernia LVAD exit site:   Dressing dry and intact.  Stabilization device present and accurately applied.  EXTREMITIES:  Warm and dry, no cyanosis, clubbing, rash or edema  NEUROLOGIC:  Alert and oriented x 4.  Affect pleasant.        Telemetry   ST 100s-110s  Labs   Basic Metabolic Panel: Recent Labs  Lab 09/23/23 1055 09/23/23 1219 09/23/23 1719 09/24/23 0800 09/24/23 1639  NA 136 138  --  131* 131*  K 4.3 3.5  --  4.3 4.6  CL 96* 102  --  95* 96*  CO2 21*  --   --  23 22  GLUCOSE 211*  196*  --  201* 173*  BUN 22 20  --  25* 30*  CREATININE 1.11 0.80  --  1.46* 1.44*  CALCIUM 9.6  --   --  8.6* 8.4*  MG  --   --  1.8  --   --     Liver Function Tests: Recent Labs  Lab 09/23/23 1719 09/24/23 1639  AST 22 25  ALT 25 24  ALKPHOS 141* 125  BILITOT 0.6 0.4  PROT 7.8 6.6  ALBUMIN 3.2* 2.8*   Recent Labs  Lab 09/23/23 1719  LIPASE 50   No results for input(s): "AMMONIA" in the last 168 hours.  CBC: Recent Labs  Lab 09/23/23 1055 09/23/23 1219 09/24/23 0800  WBC 16.0*  --  25.7*  HGB 13.8 11.9* 12.6*  HCT 40.7 35.0* 38.2*  MCV 78.3*  --  77.6*  PLT 378  --  294    INR: Recent Labs  Lab 09/23/23 1055 09/24/23 0800  INR 3.1* 3.2*    Other results: EKG:    Imaging   CT ABDOMEN PELVIS W CONTRAST  Result Date: 09/23/2023 CLINICAL DATA:  Nausea vomiting diarrhea EXAM: CT ABDOMEN AND PELVIS WITH CONTRAST TECHNIQUE: Multidetector CT imaging of the abdomen and pelvis was performed using the standard protocol following bolus administration of intravenous contrast. RADIATION DOSE REDUCTION: This exam  was performed according to the departmental dose-optimization program which includes automated exposure control, adjustment of the mA and/or kV according to patient size and/or use of iterative reconstruction technique. CONTRAST:  75mL OMNIPAQUE IOHEXOL 350 MG/ML SOLN COMPARISON:  CT 09/08/2023, 07/07/2023, 01/27/2023, 06/14/2022 FINDINGS: Lower chest: Lung bases demonstrate no acute airspace disease. Artifact from LVAD. Hepatobiliary: No focal liver abnormality is seen. No gallstones, gallbladder wall thickening, or biliary dilatation. Pancreas: Unremarkable. No pancreatic ductal dilatation or surrounding inflammatory changes. Spleen: Normal in size without focal abnormality. Adrenals/Urinary Tract: Adrenal glands are stable in appearance. Punctate nonobstructing left kidney stone. Subcentimeter hypodensities too small to further characterize, no imaging follow-up  is recommended. No hydronephrosis. The bladder is unremarkable. Stomach/Bowel: Stomach is nondistended. Mild fluid distension of left upper quadrant jejunal bowel loops without transition point suggestive of mild ileus or enteritis. No acute bowel wall thickening. Otherwise decompressed small bowel. Negative appendix Vascular/Lymphatic: Advanced aortic atherosclerosis. No aneurysm. No suspicious lymph nodes Reproductive: Negative prostate Other: Negative for pelvic effusion or free air. Diastasis of the anterior abdominal wall musculature with wide neck large hernia containing mesentery and bowel but no obstruction at this time. Residual soft tissue thickening about the drive line in the upper anterior abdomen without new fluid. Musculoskeletal: Posterior spinal hardware at the lumbosacral spine. Multilevel degenerative changes. No acute osseous abnormality IMPRESSION: 1. Mild fluid distension of left upper quadrant jejunal bowel loops without discrete transition point suggestive of mild ileus or enteritis. 2. Otherwise no CT evidence for acute intra-abdominal or pelvic abnormality. LVAD with similar skin thickening and circumferential soft tissue thickening about the drive line in the upper abdomen without development of new fluid around the drive line since the prior exam. 3. 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 Aortic atherosclerosis. Aortic Atherosclerosis (ICD10-I70.0). Electronically Signed   By: Jasmine Pang M.D.   On: 09/23/2023 17:12   DG Chest Portable 1 View  Result Date: 09/23/2023 CLINICAL DATA:  Shortness of breath. EXAM: PORTABLE CHEST 1 VIEW COMPARISON:  09/08/2023. FINDINGS: Similar cardiomediastinal silhouette. LVAD in similar location. Median sternotomy. Atrial appendage clip. No consolidation. No visible pleural effusions or pneumothorax. Remote left clavicular fracture. Polyarticular degenerative  change. IMPRESSION: No evidence of acute cardiopulmonary disease. Electronically Signed   By: Feliberto Harts M.D.   On: 09/23/2023 15:27     Medications:     Scheduled Medications:  allopurinol  100 mg Oral Daily   gabapentin  300 mg Oral TID   insulin aspart  0-15 Units Subcutaneous TID WC   metoprolol succinate  25 mg Oral Daily   minocycline  200 mg Oral BID   mirtazapine  15 mg Oral QHS   polyethylene glycol  17 g Oral Daily   rosuvastatin  20 mg Oral Daily   senna-docusate  2 tablet Oral QHS   sertraline  50 mg Oral Daily   spironolactone  12.5 mg Oral Daily   tamsulosin  0.4 mg Oral Daily    Infusions:  ceFEPime (MAXIPIME) IV Stopped (09/25/23 0335)   DAPTOmycin Stopped (09/24/23 1130)    PRN Medications: hydrALAZINE, HYDROmorphone (DILAUDID) injection, ondansetron (ZOFRAN) IV   Patient Profile   62 y.o. male with history of CAD s/p CABG, chronic systolic CHF s/p HM III VAD, chronic driveline infection, DM II, COPD, HTN, large abdominal hernia.  Presenting with severe abdominal pain X 2-3 days. Admitted for further workup and pain control.   Assessment/Plan:  Abdominal pain: -Severe X 3 days. Associated with nausea and vomiting. Etiology not certain.  -CT A/P 10/15: Possible mild ileus or enteritis, no evidence of ischemic bowel, obstruction, or VAD tunnel abscess  -Pain better controlled with PRN dilaudid -Lipase and LFTs negative. Lactic acid elevated, recheck. AF but white count has trended up to 26K. AM CBC pending. Urine culture and BC X 2 pending.  -Has been seen by TCTS and ID -Cipro switched to daptomycin and cefepime to cover MRSA and Pseudomonas, kept on minocycline to cover Acinetobacter.  -On clear liquid diet. Will discuss with Dr. Gala Romney if okay to advance as tolerated.   2. 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  Cultures have grown  acinetobacter, MRSA and pseudomonas He was readmitted with driveline infection in 2/24, completed course of daptomycin for MRSA.  Discharged from the hospital on 09/04/2023 with recurrent driveline infection growing Pseudomonas.   On chronic minocycline and ciprofloxacin at home as above.  WC 10/01 grew MRSA. Reports compliance with home regimen. Driveline appeared stable on CT A/P this admit. See further discussion above   2.  Chronic systolic CHF s/p Heartmate 3 LVAD on 11/17/20 - Not volume overloaded. Received IVF on admit. Poor PO intake recently. Multiple PI with speed drops on VAD. Give 250 cc LR bolus. - MAP higher today. Add back amlodipine 5 mg daily - Hold off on losartan til Scr nears baseline - Continue spiro 12.5 mg daily - Continue metoprolol xl 25 mg daily - Warfarin held.  INR goal 2-2.5. Needs INR this am, 3.2 yesterday. - Echo today   3. 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  - Scr 0.8>1.46 yesterday. AM labs pending.  - AKI in setting of infection, elevated lactic acid and suspected volume depletion   4.  CAD S/p CABG 12/20 - Continue Crestor 20 mg daily.    5.  Paroxysmal atrial fibrillation status post maze and left atrial appendage clip - SR this admission - Off amiodarone with hyperthyroidism.   6. Type 2 diabetes -SSI   7. Hx methamphetamine abuse:  - UDS negative   8. 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  Followed by endocrinology. He is now off amiodarone and methimazole.     9. Anemia (chronic blood loss):  - Hgb has been stable   10. COPD: Prior smoker.  - On trelegy at home   59. PVCs/NSVT: Now off amiodarone. Continue metoprolol.   I reviewed the LVAD parameters from today, and compared the results to the patient's prior recorded data.  No programming changes were made.  The LVAD is functioning within specified parameters.  The patient performs LVAD self-test daily.  LVAD  interrogation was negative for any significant power changes, alarms or PI events/speed drops.  LVAD equipment check completed and is in good working order.  Back-up equipment present.   LVAD education done on emergency procedures and precautions and reviewed exit site care.  Length of Stay: 2  Underwood, Kevin Garnet, PA-C 09/25/2023, 7:36 AM  VAD Team --- VAD ISSUES ONLY--- Pager [phone] (7am - 7am)  Advanced Heart Failure Team  Pager [phone] (M-F; 7a - 5p)  Please contact [facility] for night-coverage after hours (5p -7a ) and weekends on [email or web address]  Patient seen and examined with the above-signed Advanced Practice Provider and/or Housestaff. I personally reviewed laboratory data, imaging studies and relevant notes. I  independently examined the patient and formulated the important aspects of the plan. I have edited the note to reflect any of my changes or salient points. I have personally discussed the plan with the patient and/or family.  Remains on IV abx. Now AF. Says he doesn't feel well today. Ab sore. Asking for more pain meds. WBC down.   General:  NAD.  HEENT: normal  Neck: supple. JVP not elevated.  Carotids 2+ bilat; no bruits. No lymphadenopathy or thryomegaly appreciated. Cor: LVAD hum.  Lungs: Clear. Abdomen: soft NT/ very large incisional hernia. Dressing dry  Driveline site clean. Anchor in place.  Extremities: no cyanosis, clubbing, rash. Warm no edema  Neuro: alert & oriented x 3. No focal deficits. Moves all 4 without problem   Clinically improved on IV abx but still feels poorly but suspect there may be some secondary gain issues here.  He does not take good care of his DL and suspect he had some DL trauma which led to a/c DL infection   Doubt ileus was the primary cause of his issues although certainly doesn't help   ID obtained CT neck given previous neck pain which does not show any acute process.  Will continue IV abx for now. Hopefully we can get him home  soon on previous regimen.  VAD interrogated personally. Parameters stable.  Discussed warfarin dosing with PharmD personally.  Arvilla Meres, MD  5:27 PM

## 2023-09-26 DIAGNOSIS — T827XXA Infection and inflammatory reaction due to other cardiac and vascular devices, implants and grafts, initial encounter: Secondary | ICD-10-CM | POA: Diagnosis not present

## 2023-09-26 LAB — CBC
HCT: 38.2 % — ABNORMAL LOW (ref 39.0–52.0)
Hemoglobin: 12.6 g/dL — ABNORMAL LOW (ref 13.0–17.0)
MCH: 26.2 pg (ref 26.0–34.0)
MCHC: 33 g/dL (ref 30.0–36.0)
MCV: 79.4 fL — ABNORMAL LOW (ref 80.0–100.0)
Platelets: 245 10*3/uL (ref 150–400)
RBC: 4.81 MIL/uL (ref 4.22–5.81)
RDW: 14.6 % (ref 11.5–15.5)
WBC: 9.1 10*3/uL (ref 4.0–10.5)
nRBC: 0 % (ref 0.0–0.2)

## 2023-09-26 LAB — GLUCOSE, CAPILLARY
Glucose-Capillary: 212 mg/dL — ABNORMAL HIGH (ref 70–99)
Glucose-Capillary: 149 mg/dL — ABNORMAL HIGH (ref 70–99)
Glucose-Capillary: 184 mg/dL — ABNORMAL HIGH (ref 70–99)
Glucose-Capillary: 155 mg/dL — ABNORMAL HIGH (ref 70–99)

## 2023-09-26 LAB — BASIC METABOLIC PANEL
Anion gap: 11 (ref 5–15)
BUN: 27 mg/dL — ABNORMAL HIGH (ref 8–23)
CO2: 23 mmol/L (ref 22–32)
Calcium: 8.4 mg/dL — ABNORMAL LOW (ref 8.9–10.3)
Chloride: 98 mmol/L (ref 98–111)
Creatinine, Ser: 1.03 mg/dL (ref 0.61–1.24)
GFR, Estimated: >60 mL/min (ref >60)
Glucose, Bld: 158 mg/dL — ABNORMAL HIGH (ref 70–99)
Potassium: 4.3 mmol/L (ref 3.5–5.1)
Sodium: 132 mmol/L — ABNORMAL LOW (ref 135–145)

## 2023-09-26 LAB — PROTIME-INR
INR: 2.3 — ABNORMAL HIGH (ref 0.8–1.2)
Prothrombin Time: 25.9 s — ABNORMAL HIGH (ref 11.4–15.2)

## 2023-09-26 LAB — LACTATE DEHYDROGENASE: LDH: 162 U/L (ref 98–192)

## 2023-09-26 MED ORDER — WARFARIN - PHARMACIST DOSING INPATIENT: Freq: Every day | Status: DC

## 2023-09-26 MED ORDER — LACTATED RINGERS IV BOLUS
1000.0000 mL | Freq: Once | INTRAVENOUS | Status: AC
  Administered 2023-09-26: 1000 mL via INTRAVENOUS

## 2023-09-26 MED ORDER — METOCLOPRAMIDE HCL 5 MG/ML IJ SOLN
10.0000 mg | Freq: Three times a day (TID) | INTRAMUSCULAR | Status: DC
  Administered 2023-09-26 – 2023-09-29 (×9): 10 mg via INTRAVENOUS
  Filled 2023-09-26 (×9): qty 2

## 2023-09-26 MED ORDER — WARFARIN SODIUM 4 MG PO TABS
4.0000 mg | ORAL_TABLET | Freq: Once | ORAL | Status: AC
  Administered 2023-09-26: 4 mg via ORAL
  Filled 2023-09-26: qty 1

## 2023-09-26 NOTE — Plan of Care (Signed)
[facility] Tonsillectomy/Adenoidectomy, Postoperative PEDS care plan entered in error.

## 2023-09-26 NOTE — Progress Notes (Signed)
INFECTIOUS DISEASE ATTENDING ADDENDUM:   Date: 09/26/2023  Patient name: Kevin Underwood.  Medical record number: [id number]  Date of birth: Dec 07, 1961   TTE and CT neck reviewed  Blood cultures no growth  We would favor changing him back to his oral regimen of cipro with the minocycline and then getting rid of the vancomycin, cefepime since there is no bacteremia or abscess   In particular would recommend this shortage of IV fluids  I think his acute illness is all from ileus which has been his chief complaint  I for now please call with further questions.    Paulette Blanch Dam 09/26/2023, 11:44 AM

## 2023-09-26 NOTE — Progress Notes (Addendum)
PHARMACY - ANTICOAGULATION CONSULT NOTE  Pharmacy Consult for warfarin Indication:  HM3 LVAD  Allergies  Allergen Reactions   Firvanq [Vancomycin] Nausea And Vomiting and Other (See Comments)    Arthralgias     Patient Measurements: Height: 5\' 10"  (177.8 cm) Weight: 84.5 kg (186 lb 4.6 oz) IBW/kg (Calculated) : 73  Vital Signs: Temp: 97.8 F (36.6 C) (10/18 1100) Temp Source: Oral (10/18 1100) BP: 113/99 (10/18 1100)  Labs: Recent Labs    09/24/23 0800 09/24/23 1639 09/25/23 0846 09/26/23 1033  HGB 12.6*  --  12.7* 12.6*  HCT 38.2*  --  37.9* 38.2*  PLT 294  --  204 245  LABPROT 33.3*  --  30.2* 25.9*  INR 3.2*  --  2.9* 2.3*  CREATININE 1.46* 1.44* 1.28* 1.03  CKTOTAL  --   --  33*  --     Estimated Creatinine Clearance: 76.8 mL/min (by C-G formula based on SCr of 1.03 mg/dL).   Medical History: Past Medical History:  Diagnosis Date   "    Arthritis    CAD (coronary artery disease)    a. s/p CABG in 11/2019 with LIMA-LAD, SVG-OM1, SVG-PDA and SVG-D1   CHF (congestive heart failure) (HCC)    a. EF < 20% by echo in 11/2019   Essential hypertension    PAF (paroxysmal atrial fibrillation) (HCC)    Type 2 diabetes mellitus (HCC)     Medications:  Medications Prior to Admission  Medication Sig Dispense Refill Last Dose   acetaminophen (TYLENOL) 325 MG tablet Take 2 tablets (650 mg total) by mouth every 4 (four) hours as needed for headache or mild pain.   09/22/2023   albuterol (PROVENTIL) (2.5 MG/3ML) 0.083% nebulizer solution INHALE 3 ML BY NEBULIZATION EVERY 6 HOURS AS NEEDED FOR WHEEZING OR SHORTNESS OF BREATH (Patient taking differently: Take 2.5 mg by nebulization every 6 (six) hours as needed for shortness of breath.) 450 mL 1 09/22/2023   albuterol (VENTOLIN HFA) 108 (90 Base) MCG/ACT inhaler Inhale 2 puffs into the lungs every 6 (six) hours as needed for wheezing or shortness of breath.   09/22/2023   allopurinol (ZYLOPRIM) 100 MG tablet Take 1 tablet  (100 mg total) by mouth daily. 90 tablet 3 09/22/2023   amLODipine (NORVASC) 10 MG tablet Take 1 tablet (10 mg total) by mouth daily. 30 tablet 5 09/22/2023   ciprofloxacin (CIPRO) 750 MG tablet Take 1 tablet (750 mg total) by mouth 2 (two) times daily. 60 tablet 3 09/22/2023   colchicine 0.6 MG tablet Take 1 tablet (0.6 mg total) by mouth daily as needed (gout pain). (Patient taking differently: Take 0.6 mg by mouth daily.)   09/22/2023   COMBIVENT RESPIMAT 20-100 MCG/ACT AERS respimat Inhale 1 puff into the lungs every 6 (six) hours.   09/22/2023   fluticasone (FLONASE ALLERGY RELIEF) 50 MCG/ACT nasal spray Place 2 sprays into both nostrils as needed for allergies or rhinitis.   09/22/2023   gabapentin (NEURONTIN) 300 MG capsule Take 1 capsule (300 mg total) by mouth 3 (three) times daily. 90 capsule 0 09/22/2023   glipiZIDE (GLUCOTROL) 5 MG tablet Take 1 tablet (5 mg total) by mouth daily. 30 tablet 11 09/22/2023   guaifenesin (HUMIBID E) 400 MG TABS tablet Take 400 mg by mouth every 6 (six) hours as needed (for congestion).   09/22/2023   loratadine (CLARITIN) 10 MG tablet Take 1 tablet (10 mg total) by mouth daily. 30 tablet 6 09/22/2023   losartan (COZAAR) 50 MG  tablet Take 1 tablet (50 mg total) by mouth daily. 30 tablet 5 09/22/2023   Menthol, Topical Analgesic, (BIOFREEZE) 10 % CREA Apply 1 Application topically 2 (two) times daily as needed (Apply to all joints for pain).   09/22/2023   metFORMIN (GLUCOPHAGE) 1000 MG tablet Take 1 tablet (1,000 mg total) by mouth 2 (two) times daily. 120 tablet 6 09/22/2023   metoprolol succinate (TOPROL XL) 25 MG 24 hr tablet Take 1 tablet (25 mg total) by mouth daily. 30 tablet 5 09/22/2023   minocycline (MINOCIN) 100 MG capsule Take 2 capsules (200 mg total) by mouth 2 (two) times daily. 120 capsule 3 09/22/2023   mirtazapine (REMERON) 15 MG tablet Take 1 tablet (15 mg total) by mouth at bedtime.   Past Week   Multiple Vitamin (MULTIVITAMIN WITH  MINERALS) TABS tablet Take 1 tablet by mouth daily.   09/22/2023   Oxycodone HCl 10 MG TABS Take 1 tablet (10 mg total) by mouth every 6 (six) hours as needed. 28 tablet 0 Past Month   pantoprazole (PROTONIX) 40 MG tablet Take 1 tablet (40 mg total) by mouth daily. 90 tablet 2 09/22/2023   rosuvastatin (CRESTOR) 20 MG tablet Take 1 tablet (20 mg total) by mouth daily. 30 tablet 5 09/22/2023   sertraline (ZOLOFT) 50 MG tablet Take 1 tablet (50 mg total) by mouth daily. (Patient taking differently: Take 50 mg by mouth at bedtime.) 90 tablet 3 Past Week   spironolactone (ALDACTONE) 25 MG tablet Take 0.5 tablets (12.5 mg total) by mouth daily. (Patient taking differently: Take 25 mg by mouth daily.) 30 tablet 5 09/22/2023   tamsulosin (FLOMAX) 0.4 MG CAPS capsule Take 1 capsule (0.4 mg total) by mouth daily. 90 capsule 3 09/22/2023   thiamine (VITAMIN B-1) 100 MG tablet Take 1 tablet (100 mg total) by mouth daily.   09/22/2023   traMADol (ULTRAM) 50 MG tablet Take 2 tablets (100 mg total) by mouth every 12 (twelve) hours as needed. 120 tablet 0 Past Week   traZODone (DESYREL) 150 MG tablet Take 1 tablet (150 mg total) by mouth at bedtime. 90 tablet 3 Past Month   TRELEGY ELLIPTA 100-62.5-25 MCG/ACT AEPB Inhale 1 puff into the lungs daily. 1 each 6 09/22/2023   warfarin (COUMADIN) 4 MG tablet Take 1-1.5 tablets (4-6 mg total) by mouth See admin instructions. Take 1 tablet (4 mg) every Tuesday and Thursday, Take 1.5 tabs (6 mg) all other days 45 tablet 11 09/22/2023 at 0800   Scheduled:   allopurinol  100 mg Oral Daily   amLODipine  5 mg Oral Daily   gabapentin  300 mg Oral TID   insulin aspart  0-15 Units Subcutaneous TID WC   metoCLOPramide (REGLAN) injection  10 mg Intravenous Q8H   metoprolol succinate  25 mg Oral Daily   minocycline  200 mg Oral BID   mirtazapine  15 mg Oral QHS   polyethylene glycol  17 g Oral BID   rosuvastatin  20 mg Oral Daily   senna-docusate  2 tablet Oral QHS    sertraline  50 mg Oral Daily   spironolactone  12.5 mg Oral Daily   tamsulosin  0.4 mg Oral Daily    Assessment: Mr. Kennerson is a 62YOM who was admitted for abdominal pain and pain control. Patient has a HM3 LVAD and on warfarin PTA. INR 3.1, supratherapeutic on admission.   Warfarin PTA regimen: 4 mg T/Th and 6 mg EOD Last dose PTA: 10/14 AM  10/18: INR  2.3 today, therapeutic after holding warfarin this admission. Suspect patient will need a lower weekly dose of warfarin compared to PTA. No signs of bleeding noted. CBC stable (Hgb 12.6, Plt 245).  Goal of Therapy:  INR 2.0-2.5 Monitor platelets by anticoagulation protocol: Yes   Plan:  Warfarin 4 mg x1 Monitor INR, CBC, and bleeding daily  Enos Fling, PharmD PGY-1 Acute Care Pharmacy Resident 09/26/2023 1:07 PM

## 2023-09-26 NOTE — Progress Notes (Signed)
Physical Therapy Treatment Patient Details Name: Kevin Underwood. MRN: [id number] DOB: June 14, 1961 Today's Date: 09/26/2023   History of Present Illness 62 y.o. male admitted 10/15  Presenting with severe abdominal pain X 2-3 days. Admitted for further workup and pain control.  W/u for ileus with infection.  PMH: CAD s/p CABG, chronic systolic CHF s/p HM III VAD, chronic driveline infection, DM II, COPD, HTN, large abdominal hernia.    PT Comments  Pt admitted with above diagnosis. Pt was able to perform UE and LE exercises and issued theraband for pt to work on UE exercises.  Pt appreciative and contineus to get up in room during the day when able.  Pt currently with functional limitations due to the deficits listed below (see PT Problem List). Pt will benefit from acute skilled PT to increase their independence and safety with mobility to allow discharge.       If plan is discharge home, recommend the following: Assistance with cooking/housework;Assist for transportation;Help with stairs or ramp for entrance   Can travel by private vehicle        Equipment Recommendations  None recommended by PT    Recommendations for Other Services       Precautions / Restrictions Precautions Precautions: Fall Restrictions Weight Bearing Restrictions: No     Mobility  Bed Mobility               General bed mobility comments: Pt refused to get OOB stating he has sat up in chair and gone to bathroom a few times today    Transfers                        Ambulation/Gait                   Stairs             Wheelchair Mobility     Tilt Bed    Modified Rankin (Stroke Patients Only)       Balance                                            Cognition Arousal: Alert Behavior During Therapy: WFL for tasks assessed/performed Overall Cognitive Status: Within Functional Limits for tasks assessed                                           Exercises General Exercises - Upper Extremity Shoulder Flexion: AROM, Both, 10 reps, Theraband, Supine Theraband Level (Shoulder Flexion): Level 3 (Green) Shoulder ABduction: AROM, Both, 10 reps, Supine, Theraband Theraband Level (Shoulder Abduction): Level 3 (Green) Shoulder Horizontal ADduction: AROM, Both, 10 reps, Supine, Theraband Theraband Level (Shoulder Horizontal Adduction): Level 3 (Green) Elbow Flexion: AROM, Both, 10 reps, Supine, Theraband Theraband Level (Elbow Flexion): Level 3 (Green) Wrist Flexion: AROM, Both, 10 reps, Supine, Theraband Theraband Level (Wrist Flexion): Level 3 (Green) General Exercises - Lower Extremity Ankle Circles/Pumps: AROM, Both, 10 reps, Supine Heel Slides: AROM, Both, 10 reps, Supine Hip ABduction/ADduction: AROM, Both, 10 reps, Supine    General Comments General comments (skin integrity, edema, etc.): LVAD parameters WNL      Pertinent Vitals/Pain Pain Assessment Pain Assessment: Faces Faces Pain Scale: Hurts little more Pain Location: abdomen Pain Descriptors /  Indicators: Aching, Grimacing, Guarding Pain Intervention(s): Limited activity within patient's tolerance, Monitored during session, Repositioned    Home Living                          Prior Function            PT Goals (current goals can now be found in the care plan section) Acute Rehab PT Goals Patient Stated Goal: to go home Progress towards PT goals: Progressing toward goals    Frequency    Min 1X/week      PT Plan      Co-evaluation              AM-PAC PT "6 Clicks" Mobility   Outcome Measure  Help needed turning from your back to your side while in a flat bed without using bedrails?: None Help needed moving from lying on your back to sitting on the side of a flat bed without using bedrails?: None Help needed moving to and from a bed to a chair (including a wheelchair)?: A Little Help needed standing up from a  chair using your arms (e.g., wheelchair or bedside chair)?: A Little Help needed to walk in hospital room?: A Little Help needed climbing 3-5 steps with a railing? : A Little 6 Click Score: 20    End of Session   Activity Tolerance: Patient limited by fatigue Patient left: with call bell/phone within reach;with family/visitor present;in bed Nurse Communication: Mobility status PT Visit Diagnosis: Muscle weakness (generalized) (M62.81);Pain Pain - part of body:  (abdomen)     Time: 1610-9604 PT Time Calculation (min) (ACUTE ONLY): 15 min  Charges:    $Therapeutic Exercise: 8-22 mins PT General Charges $$ ACUTE PT VISIT: 1 Visit                     Kevin Underwood M,PT Acute Rehab Services [phone]    Kevin Underwood 09/26/2023, 4:00 PM

## 2023-09-26 NOTE — Progress Notes (Addendum)
Advanced Heart Failure VAD Team Note  PCP-Cardiologist: Nona Dell, MD   Subjective:    CT A/P 10/15: Possible mild ileus or enteritis, no evidence of ischemic bowel, obstruction, or VAD tunnel abscess  CT of Neck (neck pain) did not show any acute process.  Home cipro switched to cefepime and daptomycin, remains on po minocycline WBCs down 26>>10>>pending. Afebrile.   Continues to feel poorly, complaining of persistent abdominal pain and fullness. Difficulty eating. Denies current neck pain.   Had numerous PI events overnight and 1 low flow ~3AM (2.3). No arrhythmias on tele. MAPs 70s. No labs done yet for this morning.    Echo this admit: LVAD cannula in apex. Interventricular septum is midline. LVEF 20-25%, RV moderately reduced. No vegetations noted.    LVAD INTERROGATION:  HeartMate III LVAD:   Flow 4.3 liters/min, speed 5600, power 4.0 PI 3.9. numerous PI events and 1 low flow around 3AM (2.3)   Objective:    Vital Signs:   Temp:  [97.7 F (36.5 C)-98.7 F (37.1 C)] 97.7 F (36.5 C) (10/18 0246) Resp:  [15-20] 16 (10/18 0246) BP: (75-134)/(60-97) 101/84 (10/18 0246) SpO2:  [96 %-99 %] 98 % (10/18 0246) Weight:  [84.5 kg] 84.5 kg (10/18 0551) Last BM Date : 09/22/23 Mean arterial Pressure 70s  Intake/Output:   Intake/Output Summary (Last 24 hours) at 09/26/2023 0734 Last data filed at 09/26/2023 0500 Gross per 24 hour  Intake 400 ml  Output 1475 ml  Net -1075 ml     Physical Exam    GENERAL: fatigued appearing, laying in bed. No respiratory difficulty  HEENT: normal  NECK: Supple, JVD not elevated   CARDIAC:  Mechanical heart sounds with LVAD hum present.  LUNGS:  CTAB. No wheezing  ABDOMEN:  nondistended, large abdominal wall hernia soft and reducible + BS LVAD exit site:   Dressing dry and intact.  Stabilization device present and accurately applied.   EXTREMITIES:  Warm and dry, no cyanosis, clubbing, rash or edema  NEUROLOGIC:  Alert and  oriented x 4.  Affect pleasant. Moves all 4 ext w/o difficulty   Telemetry   NSR 90s   Labs   Basic Metabolic Panel: Recent Labs  Lab 09/23/23 1055 09/23/23 1219 09/23/23 1719 09/24/23 0800 09/24/23 1639 09/25/23 0846  NA 136 138  --  131* 131* 130*  K 4.3 3.5  --  4.3 4.6 4.3  CL 96* 102  --  95* 96* 95*  CO2 21*  --   --  23 22 19*  GLUCOSE 211* 196*  --  201* 173* 214*  BUN 22 20  --  25* 30* 35*  CREATININE 1.11 0.80  --  1.46* 1.44* 1.28*  CALCIUM 9.6  --   --  8.6* 8.4* 8.4*  MG  --   --  1.8  --   --   --     Liver Function Tests: Recent Labs  Lab 09/23/23 1719 09/24/23 1639  AST 22 25  ALT 25 24  ALKPHOS 141* 125  BILITOT 0.6 0.4  PROT 7.8 6.6  ALBUMIN 3.2* 2.8*   Recent Labs  Lab 09/23/23 1719  LIPASE 50   No results for input(s): "AMMONIA" in the last 168 hours.  CBC: Recent Labs  Lab 09/23/23 1055 09/23/23 1219 09/24/23 0800 09/25/23 0846  WBC 16.0*  --  25.7* 10.2  HGB 13.8 11.9* 12.6* 12.7*  HCT 40.7 35.0* 38.2* 37.9*  MCV 78.3*  --  77.6* 77.8*  PLT 378  --  294 204    INR: Recent Labs  Lab 09/23/23 1055 09/24/23 0800 09/25/23 0846  INR 3.1* 3.2* 2.9*    Other results: EKG:    Imaging   ECHOCARDIOGRAM COMPLETE  Result Date: 09/25/2023    ECHOCARDIOGRAM REPORT   Patient Name:   Kevin Underwood. Date of Exam: 09/25/2023 Medical Rec #:  [id number]            Height:       70.0 in Accession #:    [id number]           Weight:       186.7 lb Date of Birth:  September 05, 1961            BSA:          2.027 m Patient Age:    62 years             BP:           104/82 mmHg Patient Gender: M                    HR:           100 bpm. Exam Location:  Inpatient Procedure: 2D Echo, Color Doppler and Cardiac Doppler Indications:    LVAD, Endocarditis  History:        Patient has prior history of Echocardiogram examinations, most                 recent 06/17/2022. LVAD and CHF, CAD, Prior CABG, COPD; Risk                 Factors:Dyslipidemia and  h/o polysubstance use.  Sonographer:    Milbert Coulter Referring Phys: [id number] PETER [id number]  Sonographer Comments: Technically difficult study due to poor echo windows, no apical window and no subcostal window. Image acquisition challenging due to patient body habitus and Image acquisition challenging due to COPD. IMPRESSIONS  1. LVAD cannula in apex. Interventricular septum is midline. Aortic valve with minimal opening. Left ventricular ejection fraction, by estimation, is 20 to 25%. The left ventricle has severely decreased function. The left ventricle demonstrates global hypokinesis. There is mild concentric left ventricular hypertrophy. Left ventricular diastolic parameters are indeterminate.  2. Right ventricular systolic function is moderately reduced. The right ventricular size is moderately enlarged.  3. Right atrial size was mildly dilated.  4. The mitral valve was not well visualized. Trivial mitral valve regurgitation. No evidence of mitral stenosis.  5. The aortic valve was not well visualized. There is mild calcification of the aortic valve. Aortic valve regurgitation is not visualized. No aortic stenosis is present. Conclusion(s)/Recommendation(s): Technically very limited study due to poor sound wave transmission. FINDINGS  Left Ventricle: LVAD cannula in apex. Interventricular septum is midline. Aortic valve with minimal opening. Left ventricular ejection fraction, by estimation, is 20 to 25%. The left ventricle has severely decreased function. The left ventricle demonstrates global hypokinesis. The left ventricular internal cavity size was normal in size. There is mild concentric left ventricular hypertrophy. Left ventricular diastolic parameters are indeterminate. Right Ventricle: The right ventricular size is moderately enlarged. No increase in right ventricular wall thickness. Right ventricular systolic function is moderately reduced. Left Atrium: Left atrial size was normal in size. Right Atrium:  Right atrial size was mildly dilated. Pericardium: There is no evidence of pericardial effusion. Mitral Valve: The mitral valve was not well visualized. Trivial mitral valve regurgitation. No evidence of mitral valve stenosis. Tricuspid Valve: The tricuspid valve  is not well visualized. Tricuspid valve regurgitation is mild . No evidence of tricuspid stenosis. Aortic Valve: The aortic valve was not well visualized. There is mild calcification of the aortic valve. Aortic valve regurgitation is not visualized. No aortic stenosis is present. Pulmonic Valve: The pulmonic valve was normal in structure. Pulmonic valve regurgitation is trivial. No evidence of pulmonic stenosis. Aorta: The aortic root is normal in size and structure. Venous: The inferior vena cava was not well visualized. IAS/Shunts: No atrial level shunt detected by color flow Doppler.  LEFT VENTRICLE PLAX 2D LVIDd:         4.50 cm LVIDs:         3.90 cm LV PW:         1.30 cm LV IVS:        1.20 cm  Arvilla Meres MD Electronically signed by Arvilla Meres MD Signature Date/Time: 09/25/2023/6:33:50 PM    Final    CT CERVICAL SPINE WO CONTRAST  Result Date: 09/25/2023 CLINICAL DATA:  Neck pain, infection or inflammation suspected. EXAM: CT CERVICAL SPINE WITHOUT CONTRAST TECHNIQUE: Multidetector CT imaging of the cervical spine was performed without intravenous contrast. Multiplanar CT image reconstructions were also generated. RADIATION DOSE REDUCTION: This exam was performed according to the departmental dose-optimization program which includes automated exposure control, adjustment of the mA and/or kV according to patient size and/or use of iterative reconstruction technique. COMPARISON:  None Available. FINDINGS: Alignment: There is straightening of the normal cervical lordosis. There is no antero or retrolisthesis. Skull base and vertebrae: Skull base alignment is maintained. Vertebral body heights are preserved. There is no evidence of acute  fracture. There is no suspicious osseous lesion. There is no osseous erosion or destruction. There is fusion across the left posterior elements at C2-C3. Soft tissues and spinal canal: No prevertebral fluid or swelling. No visible canal hematoma. Disc levels: There is moderate disc space narrowing at C5-C6 and C6-C7. C2-C3: Moderate left facet arthropathy without significant spinal canal or neural foraminal stenosis. C3-C4: There is mild left worse than right uncovertebral ridging and moderate left facet arthropathy resulting in moderate left and no significant right neural foraminal stenosis and no significant spinal canal stenosis. C4-C5: There is a shallow disc protrusion and mild-to-moderate left and mild right facet arthropathy without significant spinal canal or neural foraminal stenosis. C5-C6: There is a shallow disc protrusion and mild uncovertebral ridging without significant spinal canal or neural foraminal stenosis. C6-C7: There is a shallow disc protrusion and left worse than right uncovertebral ridging resulting in moderate left and mild right neural foraminal stenosis and mild spinal canal stenosis. C7-T1: No significant spinal canal or neural foraminal stenosis. Upper chest: There is emphysema in the lung apices. Other: None. IMPRESSION: 1. No acute finding in the cervical spine. No CT evidence of infection. If there is high clinical concern, MRI with contrast is recommended. 2. Multilevel degenerative changes as detailed above with up to mild spinal canal stenosis and moderate left neural foraminal stenosis at C6-C7. Electronically Signed   By: Lesia Hausen M.D.   On: 09/25/2023 15:48     Medications:     Scheduled Medications:  allopurinol  100 mg Oral Daily   amLODipine  5 mg Oral Daily   gabapentin  300 mg Oral TID   insulin aspart  0-15 Units Subcutaneous TID WC   metoprolol succinate  25 mg Oral Daily   minocycline  200 mg Oral BID   mirtazapine  15 mg Oral QHS   polyethylene  glycol  17 g Oral BID   rosuvastatin  20 mg Oral Daily   senna-docusate  2 tablet Oral QHS   sertraline  50 mg Oral Daily   spironolactone  12.5 mg Oral Daily   tamsulosin  0.4 mg Oral Daily    Infusions:  ceFEPime (MAXIPIME) IV 2 g (09/26/23 0258)   DAPTOmycin Stopped (09/25/23 1609)    PRN Medications: hydrALAZINE, HYDROmorphone (DILAUDID) injection, ondansetron (ZOFRAN) IV   Patient Profile   62 y.o. male with history of CAD s/p CABG, chronic systolic CHF s/p HM III VAD, chronic driveline infection, DM II, COPD, HTN, large abdominal hernia.  Presenting with severe abdominal pain X 2-3 days. Admitted for further workup and pain control.   Assessment/Plan:    Abdominal pain: -Severe X 3 days. Associated with nausea and vomiting. Etiology not certain.  -CT A/P 10/15: Possible mild ileus or enteritis, no evidence of ischemic bowel, obstruction, or VAD tunnel abscess  -Pain better controlled with PRN dilaudid -Lipase and LFTs negative. Lactic acid elevated at 3.6 on admit but cleared down to 1.5. WBCs down w/ IV abx, 26>>10K. Urine culture and BC NGTD.  -Has been seen by TCTS and ID -Cipro switched to daptomycin and cefepime to cover MRSA and Pseudomonas, kept on minocycline to cover Acinetobacter.    2. 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  Cultures have grown acinetobacter, MRSA and pseudomonas He was readmitted with driveline infection in 2/24, completed course of daptomycin for MRSA.  Discharged from the hospital on 09/04/2023 with recurrent driveline infection growing Pseudomonas.   On chronic minocycline and ciprofloxacin at home as above.  WC 10/01 grew MRSA. Reports compliance with home regimen. Driveline appeared stable on CT A/P this admit.  - per ID, as he improves and gets ready for discharge would switch him back over to his chronic ciprofloxacin and minocycline     2.  Chronic systolic CHF s/p Heartmate 3 LVAD on 11/17/20 - Not volume overloaded. Received IVF on admit. Poor PO intake recently. Multiple PI with speed drops on VAD. Given 250 cc LR bolus. - Continue amlodipine 5 mg daily - Hold off on losartan til Scr nears baseline. BMP pending  - Continue spiro 12.5 mg daily - Continue metoprolol xl 25 mg daily - INR goal 2-2.5. Needs INR this am, labs pending  - Echo this admit LVAD cannula in apex. Interventricular septum is midline. LVEF 20-25%, RV moderately reduced.   3. 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  - Scr 0.8>1.46>>1.28 yesterday. AM labs pending.  - AKI in setting of infection, elevated lactic acid and suspected volume depletion   4.  CAD S/p CABG 12/20 - Continue Crestor 20 mg daily.    5.  Paroxysmal atrial fibrillation status post maze and left atrial appendage clip - SR this admission - Off amiodarone with hyperthyroidism.   6. Type 2 diabetes -SSI   7. Hx methamphetamine abuse:  - UDS negative   8. 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  Followed by endocrinology. He is now off amiodarone and methimazole.     9. Anemia (chronic blood loss):  - Hgb has been stable   10. COPD: Prior smoker.  - On trelegy at home   30. PVCs/NSVT: Now off amiodarone. Continue metoprolol.   I reviewed the LVAD parameters from today, and compared the results to the patient's prior  recorded data.  No programming changes were made.  The LVAD is functioning within specified parameters.  The patient performs LVAD self-test daily.  LVAD interrogation was negative for any significant power changes, alarms or PI events/speed drops.  LVAD equipment check completed and is in good working order.  Back-up equipment present.   LVAD education done on emergency procedures and precautions and reviewed exit site care.  Length of Stay: [address] Delmer Islam 09/26/2023, 7:34 AM  VAD  Team --- VAD ISSUES ONLY--- Pager [phone] (7am - 7am)  Advanced Heart Failure Team  Pager [phone] (M-F; 7a - 5p)  Please contact [facility] for night-coverage after hours (5p -7a ) and weekends on [email or web address]   Patient seen and examined with the above-signed Advanced Practice Provider and/or Housestaff. I personally reviewed laboratory data, imaging studies and relevant notes. I independently examined the patient and formulated the important aspects of the plan. I have edited the note to reflect any of my changes or salient points. I have personally discussed the plan with the patient and/or family.  Remains on IV abx. Afebrile.   Continues to feel poorly. Stomach hurts. Poor appetite. + nausea. CT neck negative for diskitis.    General:  NAD.  HEENT: normal  Neck: supple. JVP not elevated.  Carotids 2+ bilat; no bruits. No lymphadenopathy or thryomegaly appreciated. Cor: LVAD hum.  Lungs: Clear. Abdomen: soft NT. Large incisional hernia. DL dressing ok  Good bowel sounds. Driveline site clean. Anchor in place.  Extremities: no cyanosis, clubbing, rash. Warm no edema  Neuro: alert & oriented x 3. No focal deficits. Moves all 4 without problem   Suspect symptoms related to acute on chronic DL infection but f/u cultures negative. Leukocytosis resolving.   Will continue VI abx x 7 days then switch back to po regimen based on ID recs.   Give IVF today. Start reglan. Mobilize. Limit pain meds.   VAD interrogated personally. Parameters stable.  Discussed warfarin dosing with PharmD personally.  Home when IV abx complete and GI symptoms resolved.   Arvilla Meres, MD  3:06 PM

## 2023-09-26 NOTE — Progress Notes (Signed)
  Subjective: Patient examined and images of recent echocardiogram and C-spine CT scan personally reviewed.  The patient's echocardiogram shows no evidence of endocarditis or vegetations. CT scan of the C-spine shows no evidence of discitis. Blood cultures remain negative. White blood count and lactic acid back to normal levels. His driveline exit site has persistent indolent infection controlled with dressing changes and oral antibiotics (minocycline and Cipro) at home. With significant significant improvement in his condition with IV antibiotics, I suspect that his presentation consistent with systemic infection is due to poor compliance with his suppressive antibiotic regimen at home.  In the past his abdominal pain has been intermittent and not consistent with bowel obstruction.   Objective: Vital signs in last 24 hours: Temp:  [97.7 F (36.5 C)-98.7 F (37.1 C)] 97.8 F (36.6 C) (10/18 3086) Cardiac Rhythm: Normal sinus rhythm (10/18 0711) Resp:  [15-20] 19 (10/18 0812) BP: (75-134)/(60-97) 82/67 (10/18 0812) SpO2:  [96 %-99 %] 98 % (10/18 0812) Weight:  [84.5 kg] 84.5 kg (10/18 0551)  Hemodynamic parameters for last 24 hours:    Intake/Output from previous day: 10/17 0701 - 10/18 0700 In: 400 [IV Piggyback:400] Out: 1475 [Urine:1475] Intake/Output this shift: Total I/O In: 320 [P.O.:320] Out: -   Exam  Feels poorly, oriented appropriately Lungs clear Heart rate regular, normal VAD hum Abdomen with baseline distention without guarding Extremities warm  Lab Results: Recent Labs    09/24/23 0800 09/25/23 0846  WBC 25.7* 10.2  HGB 12.6* 12.7*  HCT 38.2* 37.9*  PLT 294 204   BMET:  Recent Labs    09/24/23 1639 09/25/23 0846  NA 131* 130*  K 4.6 4.3  CL 96* 95*  CO2 22 19*  GLUCOSE 173* 214*  BUN 30* 35*  CREATININE 1.44* 1.28*  CALCIUM 8.4* 8.4*    PT/INR:  Recent Labs    09/25/23 0846  LABPROT 30.2*  INR 2.9*   ABG    Component Value  Date/Time   PHART 7.508 (H) 11/20/2020 0438   HCO3 24.8 09/09/2021 0313   TCO2 21 (L) 09/23/2023 1219   ACIDBASEDEF 6.0 (H) 11/24/2019 1718   O2SAT 63.4 09/09/2021 0313   CBG (last 3)  Recent Labs    09/25/23 1620 09/25/23 2131 09/26/23 0634  GLUCAP 144* 255* 212*    Assessment/Plan: S/P  Continue 1 week of IV antibiotics which should improve his overall condition. No further debridement of the VAD tunnel would be possible without injury to the outflow graft superiorly or the peritoneal cavity inferior to the power cord.   LOS: 3 days    Lovett Sox 09/26/2023

## 2023-09-26 NOTE — Progress Notes (Signed)
LVAD Coordinator Rounding Note:  Admitted 09/23/23 to Heart Failure service from [facility] with worsening abdominal pain and nausea x 2-3 days.   CT abd/pelvis 09/23/23 1. Mild fluid distension of left upper quadrant jejunal bowel loops without discrete transition point suggestive of mild ileus or enteritis. 2. Otherwise no CT evidence for acute intra-abdominal or pelvic abnormality. LVAD with similar skin thickening and circumferential soft tissue thickening about the drive line in the upper abdomen without development of new fluid around the drive line since the prior exam. 3. 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 Aortic atherosclerosis.  Pt laying in bed on my arrival. States he is feeling poorly this morning. Complaining of abdominal pain. Pain medication due in 1 hr.   Echo completed 10/17. EF 20-25 % RV moderately reduced.   Had 2 asymptomatic Low Flows this morning. Discussed with Dr Gala Romney. Will give 250cc LR bolus and continue to monitor.   WBC 9.1 today. 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 Blood cxs 10/15 NGTD. ID team following. Currently on IV Daptomycin 700 mg daily, IV Cefepime 2g q 8 hrs, and PO Minocycline.    Vital signs: Temp: 97.8 HR: 110 Doppler Pressure: 98 Automatic BP: 82/67 (73) O2 Sat: 98% on RA Wt: 185.6>186.7>186.2 lbs  LVAD interrogation reveals:  Speed: 5300 (low speed) Flow: 4.3 Power: 5.0 w PI: 4.2  Alarms: 2 LOW FLOWs today (2:58 AM and 5:16 AM)  Events: >100 PI events today Hct: 31  Fixed speed:  5600 Low speed limit:  5300  Drive Line: Existing VAD dressing removed and site care performed using sterile technique by Dr. Maren Beach. Wound bed cleansed with VASHE solution. Skin surrounding wound bed cleaned with Chlora prep applicators x 2, allowed to dry. Followed by VASHE soaked 2x2 flush to exit site and then  covered with several dry 4 x 4s. Wound tunnels approx 3 cm.  Drive line partially incorporated. Small amount of thick yellow/serosanguinous drainage noted. No redness, tenderness, foul odor or rash noted. Ulcerated area distal to drive line cleansed with VASHE solution. VASHE moistened 2 x 2 placed over area and covered with gauze. 2 brown rectangular mepitel dressings applied over reddened skin/skin tears surrounding dressing prior to covering entire area with 3 large tegaderms. Drive line anchor reapplied on top of mepitel dressing to prevent skin breakdown. Continue daily wet to dry dressing changes using VASHE solution per bedside RN or VAD coordinator. Next dressing change due 09/27/23.      Pt had previous drive line debridement in OR with placement of instillation wound vac on 07/09/23 with Dr Donata Clay. This was changed to wet/dry on Friday 8/2. No further debridement of the VAD tunnel would be possible without injury to the outflow graft superiorly or the peritoneal cavity inferior to the power cord per Dr Donata Clay.   Labs:  LDH trend: 149>200  INR trend: 3.2>2.9  WBC trend: 16>26>10.2>9.1  Lactic acid trend: 3.6>2.6>3.3>2.7>2.8>2.0>1.5  Anticoagulation Plan: -INR Goal: 2.0 - 2.5 -ASA Dose: none  ICD: N/A  Infection:  09/23/23>>resp panel>>negative 09/23/23>> blood cxs>> no growth 3 days; final pending 09/24/23>>urine cx>> no growth; final  Drips:    Plan/Recommendations:  Page VAD coordinator with equipment issues or driveline problems Daily drive line wet to dry dressing changes with VASHE solution per bedside RN.   Alyce Pagan RN VAD Coordinator  Office: [phone]  24/7 Pager: [phone]

## 2023-09-27 DIAGNOSIS — T827XXA Infection and inflammatory reaction due to other cardiac and vascular devices, implants and grafts, initial encounter: Secondary | ICD-10-CM | POA: Diagnosis not present

## 2023-09-27 LAB — BASIC METABOLIC PANEL
Anion gap: 8 (ref 5–15)
BUN: 22 mg/dL (ref 8–23)
CO2: 23 mmol/L (ref 22–32)
Calcium: 8.1 mg/dL — ABNORMAL LOW (ref 8.9–10.3)
Chloride: 101 mmol/L (ref 98–111)
Creatinine, Ser: 0.97 mg/dL (ref 0.61–1.24)
GFR, Estimated: >60 mL/min (ref >60)
Glucose, Bld: 179 mg/dL — ABNORMAL HIGH (ref 70–99)
Potassium: 4.1 mmol/L (ref 3.5–5.1)
Sodium: 132 mmol/L — ABNORMAL LOW (ref 135–145)

## 2023-09-27 LAB — CBC
HCT: 37.6 % — ABNORMAL LOW (ref 39.0–52.0)
Hemoglobin: 12.3 g/dL — ABNORMAL LOW (ref 13.0–17.0)
MCH: 25.5 pg — ABNORMAL LOW (ref 26.0–34.0)
MCHC: 32.7 g/dL (ref 30.0–36.0)
MCV: 77.8 fL — ABNORMAL LOW (ref 80.0–100.0)
Platelets: 243 10*3/uL (ref 150–400)
RBC: 4.83 MIL/uL (ref 4.22–5.81)
RDW: 14.5 % (ref 11.5–15.5)
WBC: 9 10*3/uL (ref 4.0–10.5)
nRBC: 0 % (ref 0.0–0.2)

## 2023-09-27 LAB — PROTIME-INR
INR: 2.4 — ABNORMAL HIGH (ref 0.8–1.2)
Prothrombin Time: 26.4 s — ABNORMAL HIGH (ref 11.4–15.2)

## 2023-09-27 LAB — GLUCOSE, CAPILLARY
Glucose-Capillary: 221 mg/dL — ABNORMAL HIGH (ref 70–99)
Glucose-Capillary: 148 mg/dL — ABNORMAL HIGH (ref 70–99)
Glucose-Capillary: 144 mg/dL — ABNORMAL HIGH (ref 70–99)

## 2023-09-27 LAB — LACTATE DEHYDROGENASE: LDH: 167 U/L (ref 98–192)

## 2023-09-27 MED ORDER — WARFARIN SODIUM 4 MG PO TABS
4.0000 mg | ORAL_TABLET | Freq: Once | ORAL | Status: AC
  Administered 2023-09-27: 4 mg via ORAL
  Filled 2023-09-27: qty 1

## 2023-09-27 MED ORDER — LOSARTAN POTASSIUM 25 MG PO TABS
25.0000 mg | ORAL_TABLET | Freq: Every day | ORAL | Status: DC
  Administered 2023-09-28 – 2023-09-29 (×2): 25 mg via ORAL
  Filled 2023-09-27 (×3): qty 1

## 2023-09-27 NOTE — Progress Notes (Signed)
PHARMACY - ANTICOAGULATION CONSULT NOTE  Pharmacy Consult for warfarin Indication:  HM3 LVAD  Allergies  Allergen Reactions   Firvanq [Vancomycin] Nausea And Vomiting and Other (See Comments)    Arthralgias     Patient Measurements: Height: 5\' 10"  (177.8 cm) Weight: 84.2 kg (185 lb 10 oz) IBW/kg (Calculated) : 73  Vital Signs: Temp: 98.6 F (37 C) (10/19 0757) Temp Source: Oral (10/19 0757) BP: 103/85 (10/19 0757) Pulse Rate: 100 (10/19 0757)  Labs: Recent Labs    09/25/23 0846 09/26/23 1033 09/27/23 0847  HGB 12.7* 12.6* 12.3*  HCT 37.9* 38.2* 37.6*  PLT 204 245 243  LABPROT 30.2* 25.9* 26.4*  INR 2.9* 2.3* 2.4*  CREATININE 1.28* 1.03 0.97  CKTOTAL 33*  --   --     Estimated Creatinine Clearance: 81.5 mL/min (by C-G formula based on SCr of 0.97 mg/dL).   Medical History: Past Medical History:  Diagnosis Date   "    Arthritis    CAD (coronary artery disease)    a. s/p CABG in 11/2019 with LIMA-LAD, SVG-OM1, SVG-PDA and SVG-D1   CHF (congestive heart failure) (HCC)    a. EF < 20% by echo in 11/2019   Essential hypertension    PAF (paroxysmal atrial fibrillation) (HCC)    Type 2 diabetes mellitus (HCC)     Medications:  Medications Prior to Admission  Medication Sig Dispense Refill Last Dose   acetaminophen (TYLENOL) 325 MG tablet Take 2 tablets (650 mg total) by mouth every 4 (four) hours as needed for headache or mild pain.   09/22/2023   albuterol (PROVENTIL) (2.5 MG/3ML) 0.083% nebulizer solution INHALE 3 ML BY NEBULIZATION EVERY 6 HOURS AS NEEDED FOR WHEEZING OR SHORTNESS OF BREATH (Patient taking differently: Take 2.5 mg by nebulization every 6 (six) hours as needed for shortness of breath.) 450 mL 1 09/22/2023   albuterol (VENTOLIN HFA) 108 (90 Base) MCG/ACT inhaler Inhale 2 puffs into the lungs every 6 (six) hours as needed for wheezing or shortness of breath.   09/22/2023   allopurinol (ZYLOPRIM) 100 MG tablet Take 1 tablet (100 mg total) by mouth  daily. 90 tablet 3 09/22/2023   amLODipine (NORVASC) 10 MG tablet Take 1 tablet (10 mg total) by mouth daily. 30 tablet 5 09/22/2023   ciprofloxacin (CIPRO) 750 MG tablet Take 1 tablet (750 mg total) by mouth 2 (two) times daily. 60 tablet 3 09/22/2023   colchicine 0.6 MG tablet Take 1 tablet (0.6 mg total) by mouth daily as needed (gout pain). (Patient taking differently: Take 0.6 mg by mouth daily.)   09/22/2023   COMBIVENT RESPIMAT 20-100 MCG/ACT AERS respimat Inhale 1 puff into the lungs every 6 (six) hours.   09/22/2023   fluticasone (FLONASE ALLERGY RELIEF) 50 MCG/ACT nasal spray Place 2 sprays into both nostrils as needed for allergies or rhinitis.   09/22/2023   gabapentin (NEURONTIN) 300 MG capsule Take 1 capsule (300 mg total) by mouth 3 (three) times daily. 90 capsule 0 09/22/2023   glipiZIDE (GLUCOTROL) 5 MG tablet Take 1 tablet (5 mg total) by mouth daily. 30 tablet 11 09/22/2023   guaifenesin (HUMIBID E) 400 MG TABS tablet Take 400 mg by mouth every 6 (six) hours as needed (for congestion).   09/22/2023   loratadine (CLARITIN) 10 MG tablet Take 1 tablet (10 mg total) by mouth daily. 30 tablet 6 09/22/2023   losartan (COZAAR) 50 MG tablet Take 1 tablet (50 mg total) by mouth daily. 30 tablet 5 09/22/2023  Menthol, Topical Analgesic, (BIOFREEZE) 10 % CREA Apply 1 Application topically 2 (two) times daily as needed (Apply to all joints for pain).   09/22/2023   metFORMIN (GLUCOPHAGE) 1000 MG tablet Take 1 tablet (1,000 mg total) by mouth 2 (two) times daily. 120 tablet 6 09/22/2023   metoprolol succinate (TOPROL XL) 25 MG 24 hr tablet Take 1 tablet (25 mg total) by mouth daily. 30 tablet 5 09/22/2023   minocycline (MINOCIN) 100 MG capsule Take 2 capsules (200 mg total) by mouth 2 (two) times daily. 120 capsule 3 09/22/2023   mirtazapine (REMERON) 15 MG tablet Take 1 tablet (15 mg total) by mouth at bedtime.   Past Week   Multiple Vitamin (MULTIVITAMIN WITH MINERALS) TABS tablet Take 1  tablet by mouth daily.   09/22/2023   Oxycodone HCl 10 MG TABS Take 1 tablet (10 mg total) by mouth every 6 (six) hours as needed. 28 tablet 0 Past Month   pantoprazole (PROTONIX) 40 MG tablet Take 1 tablet (40 mg total) by mouth daily. 90 tablet 2 09/22/2023   rosuvastatin (CRESTOR) 20 MG tablet Take 1 tablet (20 mg total) by mouth daily. 30 tablet 5 09/22/2023   sertraline (ZOLOFT) 50 MG tablet Take 1 tablet (50 mg total) by mouth daily. (Patient taking differently: Take 50 mg by mouth at bedtime.) 90 tablet 3 Past Week   spironolactone (ALDACTONE) 25 MG tablet Take 0.5 tablets (12.5 mg total) by mouth daily. (Patient taking differently: Take 25 mg by mouth daily.) 30 tablet 5 09/22/2023   tamsulosin (FLOMAX) 0.4 MG CAPS capsule Take 1 capsule (0.4 mg total) by mouth daily. 90 capsule 3 09/22/2023   thiamine (VITAMIN B-1) 100 MG tablet Take 1 tablet (100 mg total) by mouth daily.   09/22/2023   traMADol (ULTRAM) 50 MG tablet Take 2 tablets (100 mg total) by mouth every 12 (twelve) hours as needed. 120 tablet 0 Past Week   traZODone (DESYREL) 150 MG tablet Take 1 tablet (150 mg total) by mouth at bedtime. 90 tablet 3 Past Month   TRELEGY ELLIPTA 100-62.5-25 MCG/ACT AEPB Inhale 1 puff into the lungs daily. 1 each 6 09/22/2023   warfarin (COUMADIN) 4 MG tablet Take 1-1.5 tablets (4-6 mg total) by mouth See admin instructions. Take 1 tablet (4 mg) every Tuesday and Thursday, Take 1.5 tabs (6 mg) all other days 45 tablet 11 09/22/2023 at 0800   Scheduled:   allopurinol  100 mg Oral Daily   amLODipine  5 mg Oral Daily   gabapentin  300 mg Oral TID   insulin aspart  0-15 Units Subcutaneous TID WC   metoCLOPramide (REGLAN) injection  10 mg Intravenous Q8H   metoprolol succinate  25 mg Oral Daily   minocycline  200 mg Oral BID   mirtazapine  15 mg Oral QHS   polyethylene glycol  17 g Oral BID   rosuvastatin  20 mg Oral Daily   senna-docusate  2 tablet Oral QHS   sertraline  50 mg Oral Daily    spironolactone  12.5 mg Oral Daily   tamsulosin  0.4 mg Oral Daily   Warfarin - Pharmacist Dosing Inpatient   Does not apply q1600    Assessment: Kevin Underwood is a 62YOM who was admitted for abdominal pain and pain control. Patient has a HM3 LVAD and on warfarin PTA. INR 3.1, supratherapeutic on admission.   Warfarin PTA regimen: 4 mg T/Th and 6 mg EOD Last dose PTA: 10/14 AM  10/19: INR 2.4 today, therapeutic  after holding warfarin this admission. Suspect patient will need a lower weekly dose of warfarin compared to PTA. No signs of bleeding noted. CBC stable  Goal of Therapy:  INR 2.0-2.5 Monitor platelets by anticoagulation protocol: Yes   Plan:  Warfarin 4 mg x1 Monitor INR, CBC, and bleeding daily  Kevin Underwood, PharmD PGY2 Cardiology Pharmacy Resident 09/27/2023 11:09 AM

## 2023-09-27 NOTE — Progress Notes (Signed)
Advanced Heart Failure VAD Team Note  PCP-Cardiologist: Nona Dell, MD   Subjective:    CT A/P 10/15: Possible mild ileus or enteritis, no evidence of ischemic bowel, obstruction, or VAD tunnel abscess  CT of Neck (neck pain) did not show any acute process.  Home cipro switched to cefepime and daptomycin, remains on po minocycline WBCs down 26>>10>>9. Afebrile. Cultures negative  Given IVF due to poor po intake.   INR 2.4  Feels much better this am. Eating lunch. No further n/v. Ab pain improved. MAPs improved    LVAD INTERROGATION:  HeartMate III LVAD:   Flow 4.0 liters/min, speed 5600, power 4.0  PI 5.7 PIs improved  VAD interrogated personally. Parameters stable.  Objective:    Vital Signs:   Temp:  [97.8 F (36.6 C)-99.2 F (37.3 C)] 98.6 F (37 C) (10/19 0757) Pulse Rate:  [88-100] 100 (10/19 0757) Resp:  [12-17] 17 (10/19 0757) BP: (78-142)/(45-99) 103/85 (10/19 0757) SpO2:  [97 %-99 %] 97 % (10/19 0757) Weight:  [84.2 kg] 84.2 kg (10/19 0308) Last BM Date : 09/26/23 Mean arterial Pressure 80-90s  Intake/Output:   Intake/Output Summary (Last 24 hours) at 09/27/2023 1057 Last data filed at 09/27/2023 0800 Gross per 24 hour  Intake 498 ml  Output 1150 ml  Net -652 ml     Physical Exam    General:  Sitting up on side of bed eating lunch HEENT: normal  Neck: supple. JVP not elevated.  Carotids 2+ bilat; no bruits. No lymphadenopathy or thryomegaly appreciated. Cor: LVAD hum.  Lungs: Clear. Abdomen: soft, nontender, non-distended. No hepatosplenomegaly. No bruits or masses. Good bowel sounds. Large reducible incisional hernia DL dressings clean  Extremities: no cyanosis, clubbing, rash. Warm no edema  Neuro: alert & oriented x 3. No focal deficits. Moves all 4 without problem    Telemetry   NSR 90-100 Personally reviewed  Labs   Basic Metabolic Panel: Recent Labs  Lab 09/23/23 1719 09/24/23 0800 09/24/23 1639 09/25/23 0846  09/26/23 1033 09/27/23 0847  NA  --  131* 131* 130* 132* 132*  K  --  4.3 4.6 4.3 4.3 4.1  CL  --  95* 96* 95* 98 101  CO2  --  23 22 19* 23 23  GLUCOSE  --  201* 173* 214* 158* 179*  BUN  --  25* 30* 35* 27* 22  CREATININE  --  1.46* 1.44* 1.28* 1.03 0.97  CALCIUM  --  8.6* 8.4* 8.4* 8.4* 8.1*  MG 1.8  --   --   --   --   --     Liver Function Tests: Recent Labs  Lab 09/23/23 1719 09/24/23 1639  AST 22 25  ALT 25 24  ALKPHOS 141* 125  BILITOT 0.6 0.4  PROT 7.8 6.6  ALBUMIN 3.2* 2.8*   Recent Labs  Lab 09/23/23 1719  LIPASE 50   No results for input(s): "AMMONIA" in the last 168 hours.  CBC: Recent Labs  Lab 09/23/23 1055 09/23/23 1219 09/24/23 0800 09/25/23 0846 09/26/23 1033 09/27/23 0847  WBC 16.0*  --  25.7* 10.2 9.1 9.0  HGB 13.8 11.9* 12.6* 12.7* 12.6* 12.3*  HCT 40.7 35.0* 38.2* 37.9* 38.2* 37.6*  MCV 78.3*  --  77.6* 77.8* 79.4* 77.8*  PLT 378  --  294 204 245 243    INR: Recent Labs  Lab 09/23/23 1055 09/24/23 0800 09/25/23 0846 09/26/23 1033 09/27/23 0847  INR 3.1* 3.2* 2.9* 2.3* 2.4*    Other results: EKG:  Imaging   ECHOCARDIOGRAM COMPLETE  Result Date: 09/25/2023    ECHOCARDIOGRAM REPORT   Patient Name:   Kevin Underwood. Date of Exam: 09/25/2023 Medical Rec #:  [id number]            Height:       70.0 in Accession #:    [id number]           Weight:       186.7 lb Date of Birth:  [birth year]            BSA:          2.027 m Patient Age:    62 years             BP:           104/82 mmHg Patient Gender: M                    HR:           100 bpm. Exam Location:  Inpatient Procedure: 2D Echo, Color Doppler and Cardiac Doppler Indications:    LVAD, Endocarditis  History:        Patient has prior history of Echocardiogram examinations, most                 recent 06/17/2022. LVAD and CHF, CAD, Prior CABG, COPD; Risk                 Factors:Dyslipidemia and h/o polysubstance use.  Sonographer:    Milbert Coulter Referring Phys: [id number] [id number]  VANTRIGT  Sonographer Comments: Technically difficult study due to poor echo windows, no apical window and no subcostal window. Image acquisition challenging due to patient body habitus and Image acquisition challenging due to COPD. IMPRESSIONS  1. LVAD cannula in apex. Interventricular septum is midline. Aortic valve with minimal opening. Left ventricular ejection fraction, by estimation, is 20 to 25%. The left ventricle has severely decreased function. The left ventricle demonstrates global hypokinesis. There is mild concentric left ventricular hypertrophy. Left ventricular diastolic parameters are indeterminate.  2. Right ventricular systolic function is moderately reduced. The right ventricular size is moderately enlarged.  3. Right atrial size was mildly dilated.  4. The mitral valve was not well visualized. Trivial mitral valve regurgitation. No evidence of mitral stenosis.  5. The aortic valve was not well visualized. There is mild calcification of the aortic valve. Aortic valve regurgitation is not visualized. No aortic stenosis is present. Conclusion(s)/Recommendation(s): Technically very limited study due to poor sound wave transmission. FINDINGS  Left Ventricle: LVAD cannula in apex. Interventricular septum is midline. Aortic valve with minimal opening. Left ventricular ejection fraction, by estimation, is 20 to 25%. The left ventricle has severely decreased function. The left ventricle demonstrates global hypokinesis. The left ventricular internal cavity size was normal in size. There is mild concentric left ventricular hypertrophy. Left ventricular diastolic parameters are indeterminate. Right Ventricle: The right ventricular size is moderately enlarged. No increase in right ventricular wall thickness. Right ventricular systolic function is moderately reduced. Left Atrium: Left atrial size was normal in size. Right Atrium: Right atrial size was mildly dilated. Pericardium: There is no evidence of  pericardial effusion. Mitral Valve: The mitral valve was not well visualized. Trivial mitral valve regurgitation. No evidence of mitral valve stenosis. Tricuspid Valve: The tricuspid valve is not well visualized. Tricuspid valve regurgitation is mild . No evidence of tricuspid stenosis. Aortic Valve: The aortic valve was not well visualized. There is mild calcification of the  aortic valve. Aortic valve regurgitation is not visualized. No aortic stenosis is present. Pulmonic Valve: The pulmonic valve was normal in structure. Pulmonic valve regurgitation is trivial. No evidence of pulmonic stenosis. Aorta: The aortic root is normal in size and structure. Venous: The inferior vena cava was not well visualized. IAS/Shunts: No atrial level shunt detected by color flow Doppler.  LEFT VENTRICLE PLAX 2D LVIDd:         4.50 cm LVIDs:         3.90 cm LV PW:         1.30 cm LV IVS:        1.20 cm  Arvilla Meres MD Electronically signed by Arvilla Meres MD Signature Date/Time: 09/25/2023/6:33:50 PM    Final    CT CERVICAL SPINE WO CONTRAST  Result Date: 09/25/2023 CLINICAL DATA:  Neck pain, infection or inflammation suspected. EXAM: CT CERVICAL SPINE WITHOUT CONTRAST TECHNIQUE: Multidetector CT imaging of the cervical spine was performed without intravenous contrast. Multiplanar CT image reconstructions were also generated. RADIATION DOSE REDUCTION: This exam was performed according to the departmental dose-optimization program which includes automated exposure control, adjustment of the mA and/or kV according to patient size and/or use of iterative reconstruction technique. COMPARISON:  None Available. FINDINGS: Alignment: There is straightening of the normal cervical lordosis. There is no antero or retrolisthesis. Skull base and vertebrae: Skull base alignment is maintained. Vertebral body heights are preserved. There is no evidence of acute fracture. There is no suspicious osseous lesion. There is no osseous  erosion or destruction. There is fusion across the left posterior elements at C2-C3. Soft tissues and spinal canal: No prevertebral fluid or swelling. No visible canal hematoma. Disc levels: There is moderate disc space narrowing at C5-C6 and C6-C7. C2-C3: Moderate left facet arthropathy without significant spinal canal or neural foraminal stenosis. C3-C4: There is mild left worse than right uncovertebral ridging and moderate left facet arthropathy resulting in moderate left and no significant right neural foraminal stenosis and no significant spinal canal stenosis. C4-C5: There is a shallow disc protrusion and mild-to-moderate left and mild right facet arthropathy without significant spinal canal or neural foraminal stenosis. C5-C6: There is a shallow disc protrusion and mild uncovertebral ridging without significant spinal canal or neural foraminal stenosis. C6-C7: There is a shallow disc protrusion and left worse than right uncovertebral ridging resulting in moderate left and mild right neural foraminal stenosis and mild spinal canal stenosis. C7-T1: No significant spinal canal or neural foraminal stenosis. Upper chest: There is emphysema in the lung apices. Other: None. IMPRESSION: 1. No acute finding in the cervical spine. No CT evidence of infection. If there is high clinical concern, MRI with contrast is recommended. 2. Multilevel degenerative changes as detailed above with up to mild spinal canal stenosis and moderate left neural foraminal stenosis at C6-C7. Electronically Signed   By: Lesia Hausen M.D.   On: 09/25/2023 15:48     Medications:     Scheduled Medications:  allopurinol  100 mg Oral Daily   amLODipine  5 mg Oral Daily   gabapentin  300 mg Oral TID   insulin aspart  0-15 Units Subcutaneous TID WC   metoCLOPramide (REGLAN) injection  10 mg Intravenous Q8H   metoprolol succinate  25 mg Oral Daily   minocycline  200 mg Oral BID   mirtazapine  15 mg Oral QHS   polyethylene glycol  17 g  Oral BID   rosuvastatin  20 mg Oral Daily   senna-docusate  2  tablet Oral QHS   sertraline  50 mg Oral Daily   spironolactone  12.5 mg Oral Daily   tamsulosin  0.4 mg Oral Daily   Warfarin - Pharmacist Dosing Inpatient   Does not apply q1600    Infusions:  ceFEPime (MAXIPIME) IV 2 g (09/27/23 1012)   DAPTOmycin 700 mg (09/26/23 1548)    PRN Medications: hydrALAZINE, HYDROmorphone (DILAUDID) injection, ondansetron (ZOFRAN) IV   Patient Profile   62 y.o. male with history of CAD s/p CABG, chronic systolic CHF s/p HM III VAD, chronic driveline infection, DM II, COPD, HTN, large abdominal hernia.  Presenting with severe abdominal pain X 2-3 days. Admitted for further workup and pain control.   Assessment/Plan:    Abdominal pain with n/v: - Associated with nausea and vomiting. Etiology not certain.  -CT A/P 10/15: Possible mild ileus or enteritis, no evidence of ischemic bowel, obstruction, or VAD tunnel abscess  -Lipase and LFTs negative. Lactic acid elevated at 3.6 on admit but cleared down to 1.5. WBCs down w/ IV abx, 26>>10k-> 9k. Urine culture and BC NGTD.  -Has been seen by TCTS and ID -Cipro switched to daptomycin and cefepime to cover MRSA and Pseudomonas, kept on minocycline to cover Acinetobacter.  - Felt to be combination of a/c DL infection/trauma and ileus - Po intake has been poor. Reglan and IVF started on 10/18 - Much improved today. Tolerating diet - Continue reglan at least one more day  2. 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  Cultures have grown acinetobacter, MRSA and pseudomonas He was readmitted with driveline infection in 2/24, completed course of daptomycin for MRSA.  Discharged from the hospital on 09/04/2023 with recurrent driveline infection growing Pseudomonas.   On chronic minocycline and ciprofloxacin at home as above.  WC 10/01 grew MRSA. Reports  compliance with home regimen. Driveline appeared stable on CT A/P this admit.  - per ID, as he improves and gets ready for discharge would switch him back over to his chronic ciprofloxacin and minocycline  - will treat with 7 days IV abx then switch back to home regimen - currently AF. WBC down    2.  Chronic systolic CHF s/p Heartmate 3 LVAD on 11/17/20 - Not volume overloaded. Received IVF on admit. Poor PO intake recently. Multiple PI with speed drops on VAD. Given 250 cc LR bolus. - Continue amlodipine 5 mg daily - Resume losartan  - Continue spiro 12.5 mg daily - Continue metoprolol xl 25 mg daily - INR goal 2-2.5. INR 2.4 today. Discussed warfarin dosing with PharmD personally. - Echo this admit LVAD cannula in apex. Interventricular septum is midline. LVEF 20-25%, RV moderately reduced.   3. 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  - Scr 0.8>1.46>>1.28 -> 0.97 - AKI in setting of infection, elevated lactic acid and suspected volume depletion. Improved with IVF  4.  CAD S/p CABG 12/20 - Continue Crestor 20 mg daily.  - No s/s angina   5.  Paroxysmal atrial fibrillation status post maze and left atrial appendage clip - SR this admission - Off amiodarone with hyperthyroidism.   6. Type 2 diabetes -SSI   7. Hx methamphetamine abuse:  - UDS negative   8. 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  Followed by endocrinology. He is now off amiodarone and methimazole.     9. Anemia (chronic blood loss):  -  Hgb has been stable - Hgb 12.3 today   10. COPD: Prior smoker.  - On trelegy at home   I reviewed the LVAD parameters from today, and compared the results to the patient's prior recorded data.  No programming changes were made.  The LVAD is functioning within specified parameters.  The patient performs LVAD self-test daily.  LVAD interrogation was negative for any significant power changes, alarms or PI events/speed  drops.  LVAD equipment check completed and is in good working order.  Back-up equipment present.   LVAD education done on emergency procedures and precautions and reviewed exit site care.  Length of Stay: 4  Arvilla Meres, MD 09/27/2023, 10:57 AM  VAD Team --- VAD ISSUES ONLY--- Pager [phone] (7am - 7am)  Advanced Heart Failure Team  Pager [phone] (M-F; 7a - 5p)  Please contact [facility] for night-coverage after hours (5p -7a ) and weekends on [email or web address]

## 2023-09-28 DIAGNOSIS — T827XXA Infection and inflammatory reaction due to other cardiac and vascular devices, implants and grafts, initial encounter: Secondary | ICD-10-CM | POA: Diagnosis not present

## 2023-09-28 DIAGNOSIS — I5022 Chronic systolic (congestive) heart failure: Secondary | ICD-10-CM | POA: Diagnosis not present

## 2023-09-28 DIAGNOSIS — Z95811 Presence of heart assist device: Secondary | ICD-10-CM | POA: Diagnosis not present

## 2023-09-28 LAB — CBC
HCT: 42.4 % (ref 39.0–52.0)
Hemoglobin: 13.8 g/dL (ref 13.0–17.0)
MCH: 25.3 pg — ABNORMAL LOW (ref 26.0–34.0)
MCHC: 32.5 g/dL (ref 30.0–36.0)
MCV: 77.7 fL — ABNORMAL LOW (ref 80.0–100.0)
Platelets: 258 10*3/uL (ref 150–400)
RBC: 5.46 MIL/uL (ref 4.22–5.81)
RDW: 14.4 % (ref 11.5–15.5)
WBC: 8.5 10*3/uL (ref 4.0–10.5)
nRBC: 0 % (ref 0.0–0.2)

## 2023-09-28 LAB — BASIC METABOLIC PANEL
Anion gap: 12 (ref 5–15)
BUN: 19 mg/dL (ref 8–23)
CO2: 23 mmol/L (ref 22–32)
Calcium: 9.1 mg/dL (ref 8.9–10.3)
Chloride: 98 mmol/L (ref 98–111)
Creatinine, Ser: 0.99 mg/dL (ref 0.61–1.24)
GFR, Estimated: >60 mL/min (ref >60)
Glucose, Bld: 205 mg/dL — ABNORMAL HIGH (ref 70–99)
Potassium: 4 mmol/L (ref 3.5–5.1)
Sodium: 133 mmol/L — ABNORMAL LOW (ref 135–145)

## 2023-09-28 LAB — GLUCOSE, CAPILLARY
Glucose-Capillary: 178 mg/dL — ABNORMAL HIGH (ref 70–99)
Glucose-Capillary: 237 mg/dL — ABNORMAL HIGH (ref 70–99)
Glucose-Capillary: 250 mg/dL — ABNORMAL HIGH (ref 70–99)
Glucose-Capillary: 234 mg/dL — ABNORMAL HIGH (ref 70–99)

## 2023-09-28 LAB — CULTURE, BLOOD (ROUTINE X 2)
Culture: NO GROWTH
Culture: NO GROWTH
Special Requests: ADEQUATE

## 2023-09-28 LAB — PROTIME-INR
INR: 2.5 — ABNORMAL HIGH (ref 0.8–1.2)
Prothrombin Time: 27.1 s — ABNORMAL HIGH (ref 11.4–15.2)

## 2023-09-28 LAB — LACTATE DEHYDROGENASE: LDH: 173 U/L (ref 98–192)

## 2023-09-28 MED ORDER — WARFARIN SODIUM 2 MG PO TABS
2.0000 mg | ORAL_TABLET | Freq: Once | ORAL | Status: AC
Start: 2023-09-28 — End: 2023-09-28
  Administered 2023-09-28: 2 mg via ORAL
  Filled 2023-09-28: qty 1

## 2023-09-28 MED ORDER — AMLODIPINE BESYLATE 5 MG PO TABS
5.0000 mg | ORAL_TABLET | Freq: Once | ORAL | Status: AC
  Administered 2023-09-28: 5 mg via ORAL
  Filled 2023-09-28: qty 1

## 2023-09-28 MED ORDER — AMLODIPINE BESYLATE 10 MG PO TABS
10.0000 mg | ORAL_TABLET | Freq: Every day | ORAL | Status: DC
  Administered 2023-09-29 – 2023-10-02 (×4): 10 mg via ORAL
  Filled 2023-09-28 (×4): qty 1

## 2023-09-28 NOTE — Progress Notes (Signed)
PHARMACY - ANTICOAGULATION CONSULT NOTE  Pharmacy Consult for warfarin Indication:  HM3 LVAD  Allergies  Allergen Reactions   Firvanq [Vancomycin] Nausea And Vomiting and Other (See Comments)    Arthralgias     Patient Measurements: Height: 5\' 10"  (177.8 cm) Weight: 83.6 kg (184 lb 4.9 oz) IBW/kg (Calculated) : 73  Vital Signs: Temp: 97.8 F (36.6 C) (10/20 1157) Temp Source: Oral (10/20 1157) BP: 113/97 (10/20 1157) Pulse Rate: 109 (10/20 1157)  Labs: Recent Labs    09/26/23 1033 09/27/23 0847 09/28/23 1016 09/28/23 1447  HGB 12.6* 12.3* 13.8  --   HCT 38.2* 37.6* 42.4  --   PLT 245 243 258  --   LABPROT 25.9* 26.4*  --  27.1*  INR 2.3* 2.4*  --  2.5*  CREATININE 1.03 0.97 0.99  --     Estimated Creatinine Clearance: 79.9 mL/min (by C-G formula based on SCr of 0.99 mg/dL).   Medical History: Past Medical History:  Diagnosis Date   "    Arthritis    CAD (coronary artery disease)    a. s/p CABG in 11/2019 with LIMA-LAD, SVG-OM1, SVG-PDA and SVG-D1   CHF (congestive heart failure) (HCC)    a. EF < 20% by echo in 11/2019   Essential hypertension    PAF (paroxysmal atrial fibrillation) (HCC)    Type 2 diabetes mellitus (HCC)     Medications:  Medications Prior to Admission  Medication Sig Dispense Refill Last Dose   acetaminophen (TYLENOL) 325 MG tablet Take 2 tablets (650 mg total) by mouth every 4 (four) hours as needed for headache or mild pain.   09/22/2023   albuterol (PROVENTIL) (2.5 MG/3ML) 0.083% nebulizer solution INHALE 3 ML BY NEBULIZATION EVERY 6 HOURS AS NEEDED FOR WHEEZING OR SHORTNESS OF BREATH (Patient taking differently: Take 2.5 mg by nebulization every 6 (six) hours as needed for shortness of breath.) 450 mL 1 09/22/2023   albuterol (VENTOLIN HFA) 108 (90 Base) MCG/ACT inhaler Inhale 2 puffs into the lungs every 6 (six) hours as needed for wheezing or shortness of breath.   09/22/2023   allopurinol (ZYLOPRIM) 100 MG tablet Take 1 tablet (100  mg total) by mouth daily. 90 tablet 3 09/22/2023   amLODipine (NORVASC) 10 MG tablet Take 1 tablet (10 mg total) by mouth daily. 30 tablet 5 09/22/2023   ciprofloxacin (CIPRO) 750 MG tablet Take 1 tablet (750 mg total) by mouth 2 (two) times daily. 60 tablet 3 09/22/2023   colchicine 0.6 MG tablet Take 1 tablet (0.6 mg total) by mouth daily as needed (gout pain). (Patient taking differently: Take 0.6 mg by mouth daily.)   09/22/2023   COMBIVENT RESPIMAT 20-100 MCG/ACT AERS respimat Inhale 1 puff into the lungs every 6 (six) hours.   09/22/2023   fluticasone (FLONASE ALLERGY RELIEF) 50 MCG/ACT nasal spray Place 2 sprays into both nostrils as needed for allergies or rhinitis.   09/22/2023   gabapentin (NEURONTIN) 300 MG capsule Take 1 capsule (300 mg total) by mouth 3 (three) times daily. 90 capsule 0 09/22/2023   glipiZIDE (GLUCOTROL) 5 MG tablet Take 1 tablet (5 mg total) by mouth daily. 30 tablet 11 09/22/2023   guaifenesin (HUMIBID E) 400 MG TABS tablet Take 400 mg by mouth every 6 (six) hours as needed (for congestion).   09/22/2023   loratadine (CLARITIN) 10 MG tablet Take 1 tablet (10 mg total) by mouth daily. 30 tablet 6 09/22/2023   losartan (COZAAR) 50 MG tablet Take 1 tablet (50  mg total) by mouth daily. 30 tablet 5 09/22/2023   Menthol, Topical Analgesic, (BIOFREEZE) 10 % CREA Apply 1 Application topically 2 (two) times daily as needed (Apply to all joints for pain).   09/22/2023   metFORMIN (GLUCOPHAGE) 1000 MG tablet Take 1 tablet (1,000 mg total) by mouth 2 (two) times daily. 120 tablet 6 09/22/2023   metoprolol succinate (TOPROL XL) 25 MG 24 hr tablet Take 1 tablet (25 mg total) by mouth daily. 30 tablet 5 09/22/2023   minocycline (MINOCIN) 100 MG capsule Take 2 capsules (200 mg total) by mouth 2 (two) times daily. 120 capsule 3 09/22/2023   mirtazapine (REMERON) 15 MG tablet Take 1 tablet (15 mg total) by mouth at bedtime.   Past Week   Multiple Vitamin (MULTIVITAMIN WITH MINERALS)  TABS tablet Take 1 tablet by mouth daily.   09/22/2023   Oxycodone HCl 10 MG TABS Take 1 tablet (10 mg total) by mouth every 6 (six) hours as needed. 28 tablet 0 Past Month   pantoprazole (PROTONIX) 40 MG tablet Take 1 tablet (40 mg total) by mouth daily. 90 tablet 2 09/22/2023   rosuvastatin (CRESTOR) 20 MG tablet Take 1 tablet (20 mg total) by mouth daily. 30 tablet 5 09/22/2023   sertraline (ZOLOFT) 50 MG tablet Take 1 tablet (50 mg total) by mouth daily. (Patient taking differently: Take 50 mg by mouth at bedtime.) 90 tablet 3 Past Week   spironolactone (ALDACTONE) 25 MG tablet Take 0.5 tablets (12.5 mg total) by mouth daily. (Patient taking differently: Take 25 mg by mouth daily.) 30 tablet 5 09/22/2023   tamsulosin (FLOMAX) 0.4 MG CAPS capsule Take 1 capsule (0.4 mg total) by mouth daily. 90 capsule 3 09/22/2023   thiamine (VITAMIN B-1) 100 MG tablet Take 1 tablet (100 mg total) by mouth daily.   09/22/2023   traMADol (ULTRAM) 50 MG tablet Take 2 tablets (100 mg total) by mouth every 12 (twelve) hours as needed. 120 tablet 0 Past Week   traZODone (DESYREL) 150 MG tablet Take 1 tablet (150 mg total) by mouth at bedtime. 90 tablet 3 Past Month   TRELEGY ELLIPTA 100-62.5-25 MCG/ACT AEPB Inhale 1 puff into the lungs daily. 1 each 6 09/22/2023   warfarin (COUMADIN) 4 MG tablet Take 1-1.5 tablets (4-6 mg total) by mouth See admin instructions. Take 1 tablet (4 mg) every Tuesday and Thursday, Take 1.5 tabs (6 mg) all other days 45 tablet 11 09/22/2023 at 0800   Scheduled:   allopurinol  100 mg Oral Daily   [START ON 09/29/2023] amLODipine  10 mg Oral Daily   gabapentin  300 mg Oral TID   insulin aspart  0-15 Units Subcutaneous TID WC   losartan  25 mg Oral Daily   metoCLOPramide (REGLAN) injection  10 mg Intravenous Q8H   metoprolol succinate  25 mg Oral Daily   minocycline  200 mg Oral BID   mirtazapine  15 mg Oral QHS   polyethylene glycol  17 g Oral BID   rosuvastatin  20 mg Oral Daily    senna-docusate  2 tablet Oral QHS   sertraline  50 mg Oral Daily   spironolactone  12.5 mg Oral Daily   tamsulosin  0.4 mg Oral Daily   warfarin  2 mg Oral ONCE-1600   Warfarin - Pharmacist Dosing Inpatient   Does not apply q1600    Assessment: Mr. Ouimette is a 62YOM who was admitted for abdominal pain and pain control. Patient has a HM3 LVAD and on  warfarin PTA. INR 3.1, supratherapeutic on admission.   Warfarin PTA regimen: 4 mg T/Th and 6 mg EOD Last dose PTA: 10/14 AM  10/19: INR 2.4 today, therapeutic after holding warfarin this admission. Suspect patient will need a lower weekly dose of warfarin compared to PTA. No signs of bleeding noted. CBC stable  10/20: INR up to 2.5   Goal of Therapy:  INR 2.0-2.5 Monitor platelets by anticoagulation protocol: Yes   Plan:  Warfarin 2 mg x1 Monitor INR, CBC, and bleeding daily  Thank you Okey Regal, PharmD 09/28/2023 3:58 PM

## 2023-09-28 NOTE — Progress Notes (Signed)
Patient ID: Kevin Underwood., male   DOB: 10/31/1961, 62 y.o.   MRN: [id number]   Advanced Heart Failure VAD Team Note  PCP-Cardiologist: Nona Dell, MD   Subjective:    CT A/P 10/15: Possible mild ileus or enteritis, no evidence of ischemic bowel, obstruction, or VAD tunnel abscess  CT of Neck (neck pain) did not show any acute process.  No labs yet today.   Home cipro switched to cefepime and daptomycin, remains on po minocycline  WBCs down 26>>10>>9 yesterday. Afebrile. Cultures negative  Eating but not much, still feels bloated.    MAP elevated 100s.   LVAD INTERROGATION:  HeartMate III LVAD:   Flow 4.0 liters/min, speed 5600, power 4.0  PI 5.6.  VAD interrogated personally. Parameters stable.  Objective:    Vital Signs:   Temp:  [97.7 F (36.5 C)-98.8 F (37.1 C)] 97.7 F (36.5 C) (10/20 0731) Pulse Rate:  [92-111] 105 (10/20 0731) Resp:  [14-19] 16 (10/20 0731) BP: (90-132)/(74-111) 90/80 (10/20 0731) SpO2:  [93 %-99 %] 98 % (10/20 0731) Weight:  [83.6 kg] 83.6 kg (10/20 0500) Last BM Date : 09/27/23 Mean arterial Pressure 100s  Intake/Output:   Intake/Output Summary (Last 24 hours) at 09/28/2023 0953 Last data filed at 09/28/2023 0443 Gross per 24 hour  Intake 540 ml  Output 2700 ml  Net -2160 ml     Physical Exam    General: Well appearing this am. NAD.  HEENT: Normal. Neck: Supple, JVP 7-8 cm. Carotids OK.  Cardiac:  Mechanical heart sounds with LVAD hum present.  Lungs:  CTAB, normal effort.  Abdomen:  NT, has large ventral hernia.  Abdomen is soft.  LVAD exit site: Well-healed and incorporated. Dressing dry and intact. No erythema or drainage. Stabilization device present and accurately applied. Driveline dressing changed daily per sterile technique. Extremities:  Warm and dry. No cyanosis, clubbing, rash, or edema.  Neuro:  Alert & oriented x 3. Cranial nerves grossly intact. Moves all 4 extremities w/o difficulty. Affect pleasant      Telemetry   NSR 90-100 Personally reviewed  Labs   Basic Metabolic Panel: Recent Labs  Lab 09/23/23 1719 09/24/23 0800 09/24/23 1639 09/25/23 0846 09/26/23 1033 09/27/23 0847  NA  --  131* 131* 130* 132* 132*  K  --  4.3 4.6 4.3 4.3 4.1  CL  --  95* 96* 95* 98 101  CO2  --  23 22 19* 23 23  GLUCOSE  --  201* 173* 214* 158* 179*  BUN  --  25* 30* 35* 27* 22  CREATININE  --  1.46* 1.44* 1.28* 1.03 0.97  CALCIUM  --  8.6* 8.4* 8.4* 8.4* 8.1*  MG 1.8  --   --   --   --   --     Liver Function Tests: Recent Labs  Lab 09/23/23 1719 09/24/23 1639  AST 22 25  ALT 25 24  ALKPHOS 141* 125  BILITOT 0.6 0.4  PROT 7.8 6.6  ALBUMIN 3.2* 2.8*   Recent Labs  Lab 09/23/23 1719  LIPASE 50   No results for input(s): "AMMONIA" in the last 168 hours.  CBC: Recent Labs  Lab 09/23/23 1055 09/23/23 1219 09/24/23 0800 09/25/23 0846 09/26/23 1033 09/27/23 0847  WBC 16.0*  --  25.7* 10.2 9.1 9.0  HGB 13.8 11.9* 12.6* 12.7* 12.6* 12.3*  HCT 40.7 35.0* 38.2* 37.9* 38.2* 37.6*  MCV 78.3*  --  77.6* 77.8* 79.4* 77.8*  PLT 378  --  294 204 245 243    INR: Recent Labs  Lab 09/23/23 1055 09/24/23 0800 09/25/23 0846 09/26/23 1033 09/27/23 0847  INR 3.1* 3.2* 2.9* 2.3* 2.4*    Other results: EKG:    Imaging   No results found.   Medications:     Scheduled Medications:  allopurinol  100 mg Oral Daily   [START ON 09/29/2023] amLODipine  10 mg Oral Daily   amLODipine  5 mg Oral Once   gabapentin  300 mg Oral TID   insulin aspart  0-15 Units Subcutaneous TID WC   losartan  25 mg Oral Daily   metoCLOPramide (REGLAN) injection  10 mg Intravenous Q8H   metoprolol succinate  25 mg Oral Daily   minocycline  200 mg Oral BID   mirtazapine  15 mg Oral QHS   polyethylene glycol  17 g Oral BID   rosuvastatin  20 mg Oral Daily   senna-docusate  2 tablet Oral QHS   sertraline  50 mg Oral Daily   spironolactone  12.5 mg Oral Daily   tamsulosin  0.4 mg Oral Daily    Warfarin - Pharmacist Dosing Inpatient   Does not apply q1600    Infusions:  ceFEPime (MAXIPIME) IV 2 g (09/28/23 0920)   DAPTOmycin 700 mg (09/27/23 1509)    PRN Medications: hydrALAZINE, HYDROmorphone (DILAUDID) injection, ondansetron (ZOFRAN) IV   Patient Profile   62 y.o. male with history of CAD s/p CABG, chronic systolic CHF s/p HM III VAD, chronic driveline infection, DM II, COPD, HTN, large abdominal hernia.  Presenting with severe abdominal pain X 2-3 days. Admitted for further workup and pain control.   Assessment/Plan:    1. Abdominal pain with n/v:  Associated with nausea and vomiting. CT A/P 10/15 showed possible mild ileus or enteritis, no evidence of ischemic bowel, obstruction, or VAD tunnel abscess.  Lipase and LFTs negative. Lactic acid elevated at 3.6 on admit but cleared down to 1.5. WBCs down w/ IV abx, 26>>10k-> 9k. Urine culture and BC NGTD.  Has been seen by TCTS and ID.  Suspect presentation is due to ileus + acute on chronic driveline infection. Still feels bloated.  Eating but not well.  - Cipro switched to daptomycin and cefepime due to poor po intake to cover MRSA and Pseudomonas, kept on minocycline to cover Acinetobacter.  - Encouraged small frequent meals.  - Decrease Reglan to 5 mg tid then hopefully stop tomorrow.  2. 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  Cultures have grown acinetobacter, MRSA and pseudomonas He was readmitted with driveline infection in 2/24, completed course of daptomycin for MRSA.  Discharged from the hospital on 09/04/2023 with recurrent driveline infection growing Pseudomonas.   On chronic minocycline and ciprofloxacin at home as above.  Wound culture 10/1 grew MRSA. Reports compliance with home regimen. Driveline appeared stable on CT A/P this admit. Suspect persistent indolent driveline infection. Afebrile, WBCs 9 yesterday.   -  per ID, as he improves and gets ready for discharge would switch him back over to his chronic ciprofloxacin and minocycline  - will treat with 7 days IV abx (cefepime/daptomycin) to 10/22 then switch back to home regimen - CBC today.  2.  Chronic systolic CHF s/p Heartmate 3 LVAD on 11/17/20. Echo this admit LVAD cannula in apex. Interventricular septum is midline. LVEF 20-25%, RV moderately reduced.  Not volume overloaded. Received IVF on admit due to poor  po intake with ileus. MAP elevated. INR goal 2 - 2.5.  - Increase amlodipine to 10 mg daily - Continue losartan.  - Continue spiro 12.5 mg daily - Continue metoprolol xl 25 mg daily - Needs INR today, continue warfarin.  3. 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 Scr 0.8>1.46>>1.28 -> 0.97. AKI in setting of infection, elevated lactic acid and suspected volume depletion. Improved with IVF.  - BMET today.  4.  CAD S/p CABG 12/20 - Continue Crestor 20 mg daily.  5.  Paroxysmal atrial fibrillation status post maze and left atrial appendage clip. NSR this admission. - Off amiodarone with hyperthyroidism. 6. Type 2 diabetes -SSI 7. Hx methamphetamine abuse:  - UDS negative 8. 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  Followed by endocrinology. He is now off amiodarone and methimazole.  9. Anemia (chronic blood loss):  Hgb has been stable - CBC today.  10. COPD: Prior smoker.  - On trelegy at home 11. Ventral hernia: Large ventral hernia, soft and nontender.  Does not appear incarcerated.  No bowel obstruction on 09/23/23 abdominal CT.  Not candidate for surgery due to proximity of hernia to driveline tunnel.    I reviewed the LVAD parameters from today, and compared the results to the patient's prior recorded data.  No programming changes were made.  The LVAD is functioning within specified parameters.  The patient performs LVAD self-test daily.  LVAD interrogation was negative for  any significant power changes, alarms or PI events/speed drops.  LVAD equipment check completed and is in good working order.  Back-up equipment present.   LVAD education done on emergency procedures and precautions and reviewed exit site care.  Length of Stay: 5  Marca Ancona, MD 09/28/2023, 9:53 AM  VAD Team --- VAD ISSUES ONLY--- Pager [phone] (7am - 7am)  Advanced Heart Failure Team  Pager [phone] (M-F; 7a - 5p)  Please contact [facility] for night-coverage after hours (5p -7a ) and weekends on [email or web address]

## 2023-09-29 DIAGNOSIS — T827XXA Infection and inflammatory reaction due to other cardiac and vascular devices, implants and grafts, initial encounter: Secondary | ICD-10-CM | POA: Diagnosis not present

## 2023-09-29 DIAGNOSIS — N179 Acute kidney failure, unspecified: Secondary | ICD-10-CM

## 2023-09-29 DIAGNOSIS — I5022 Chronic systolic (congestive) heart failure: Secondary | ICD-10-CM | POA: Diagnosis not present

## 2023-09-29 LAB — GLUCOSE, CAPILLARY
Glucose-Capillary: 194 mg/dL — ABNORMAL HIGH (ref 70–99)
Glucose-Capillary: 200 mg/dL — ABNORMAL HIGH (ref 70–99)
Glucose-Capillary: 188 mg/dL — ABNORMAL HIGH (ref 70–99)
Glucose-Capillary: 216 mg/dL — ABNORMAL HIGH (ref 70–99)
Glucose-Capillary: 229 mg/dL — ABNORMAL HIGH (ref 70–99)

## 2023-09-29 LAB — CBC
HCT: 32.9 % — ABNORMAL LOW (ref 39.0–52.0)
Hemoglobin: 10.8 g/dL — ABNORMAL LOW (ref 13.0–17.0)
MCH: 25.7 pg — ABNORMAL LOW (ref 26.0–34.0)
MCHC: 32.8 g/dL (ref 30.0–36.0)
MCV: 78.1 fL — ABNORMAL LOW (ref 80.0–100.0)
Platelets: 216 10*3/uL (ref 150–400)
RBC: 4.21 MIL/uL — ABNORMAL LOW (ref 4.22–5.81)
RDW: 14.4 % (ref 11.5–15.5)
WBC: 8.8 10*3/uL (ref 4.0–10.5)
nRBC: 0 % (ref 0.0–0.2)

## 2023-09-29 LAB — BASIC METABOLIC PANEL
Anion gap: 5 (ref 5–15)
BUN: 20 mg/dL (ref 8–23)
CO2: 25 mmol/L (ref 22–32)
Calcium: 7.8 mg/dL — ABNORMAL LOW (ref 8.9–10.3)
Chloride: 101 mmol/L (ref 98–111)
Creatinine, Ser: 0.96 mg/dL (ref 0.61–1.24)
GFR, Estimated: >60 mL/min (ref >60)
Glucose, Bld: 256 mg/dL — ABNORMAL HIGH (ref 70–99)
Potassium: 4.5 mmol/L (ref 3.5–5.1)
Sodium: 131 mmol/L — ABNORMAL LOW (ref 135–145)

## 2023-09-29 LAB — PROTIME-INR
INR: 2.3 — ABNORMAL HIGH (ref 0.8–1.2)
Prothrombin Time: 25.8 s — ABNORMAL HIGH (ref 11.4–15.2)

## 2023-09-29 LAB — LACTATE DEHYDROGENASE: LDH: 180 U/L (ref 98–192)

## 2023-09-29 MED ORDER — METOCLOPRAMIDE HCL 5 MG/ML IJ SOLN
5.0000 mg | Freq: Three times a day (TID) | INTRAMUSCULAR | Status: DC
  Administered 2023-09-29 – 2023-09-30 (×3): 5 mg via INTRAVENOUS
  Filled 2023-09-29 (×3): qty 2

## 2023-09-29 MED ORDER — INSULIN GLARGINE-YFGN 100 UNIT/ML ~~LOC~~ SOLN
12.0000 [IU] | Freq: Every day | SUBCUTANEOUS | Status: DC
  Administered 2023-09-29 – 2023-10-02 (×4): 12 [IU] via SUBCUTANEOUS
  Filled 2023-09-29 (×4): qty 0.12

## 2023-09-29 MED ORDER — CIPROFLOXACIN HCL 750 MG PO TABS
750.0000 mg | ORAL_TABLET | Freq: Two times a day (BID) | ORAL | Status: DC
  Administered 2023-10-01 – 2023-10-02 (×3): 750 mg via ORAL
  Filled 2023-09-29 (×4): qty 1

## 2023-09-29 MED ORDER — WARFARIN SODIUM 4 MG PO TABS
4.0000 mg | ORAL_TABLET | Freq: Once | ORAL | Status: AC
  Administered 2023-09-29: 4 mg via ORAL
  Filled 2023-09-29: qty 1

## 2023-09-29 NOTE — Progress Notes (Signed)
Patient ID: Kevin Rigg., male   DOB: 1960/12/30, 63 y.o.   MRN: [id number]   Advanced Heart Failure VAD Team Note  PCP-Cardiologist: Nona Dell, MD   Subjective:    CT A/P 10/15: Possible mild ileus or enteritis, no evidence of ischemic bowel, obstruction, or VAD tunnel abscess  CT of Neck (neck pain) did not show any acute process.  No labs yet today.   Home cipro switched to cefepime and daptomycin, remains on po minocycline. WBCs down 26>>10>>9>>8.9 yesterday. Afebrile. Cultures negative  MAPs improved with amlodipine increase 10/20. MAPS 80s-90s  Ate most of his breakfast this morning, feeling better today. Denies N/V.   LVAD INTERROGATION:  HeartMate III LVAD:   Flow 3.8 liters/min, speed 5600, power 4.3  PI 8.  VAD interrogated personally >100 PI events so far today  Objective:    Vital Signs:   Temp:  [97.6 F (36.4 C)-98.5 F (36.9 C)] 97.6 F (36.4 C) (10/21 0511) Pulse Rate:  [90-109] 91 (10/21 0511) Resp:  [14-17] 15 (10/21 0511) BP: (75-135)/(64-97) 135/86 (10/21 0511) SpO2:  [99 %-100 %] 99 % (10/21 0511) Weight:  [84.5 kg] 84.5 kg (10/21 0511) Last BM Date : 09/27/23 Mean arterial Pressure 80s-90s  Intake/Output:   Intake/Output Summary (Last 24 hours) at 09/29/2023 0748 Last data filed at 09/29/2023 0617 Gross per 24 hour  Intake 1595 ml  Output 1650 ml  Net -55 ml     Physical Exam  General:  Well appearing. No resp difficulty HEENT: Normal Neck: supple. JVP ~6. Carotids 2+ bilat; no bruits. No lymphadenopathy or thyromegaly appreciated. Cor: Mechanical heart sounds with LVAD hum present. Lungs: Clear Abdomen: soft, nontender, distended. No hepatosplenomegaly. No bruits or masses. Good bowel sounds. Large abd hernia.  Driveline: C/D/I; securement device intact and driveline incorporated Extremities: no cyanosis, clubbing, rash, edema Neuro: alert & orientedx3, cranial nerves grossly intact. moves all 4 extremities w/o difficulty.  Affect pleasant   Telemetry   ST low 100s (Personally reviewed)    Labs   Basic Metabolic Panel: Recent Labs  Lab 09/23/23 1719 09/24/23 0800 09/24/23 1639 09/25/23 0846 09/26/23 1033 09/27/23 0847 09/28/23 1016  NA  --    < > 131* 130* 132* 132* 133*  K  --    < > 4.6 4.3 4.3 4.1 4.0  CL  --    < > 96* 95* 98 101 98  CO2  --    < > 22 19* 23 23 23   GLUCOSE  --    < > 173* 214* 158* 179* 205*  BUN  --    < > 30* 35* 27* 22 19  CREATININE  --    < > 1.44* 1.28* 1.03 0.97 0.99  CALCIUM  --    < > 8.4* 8.4* 8.4* 8.1* 9.1  MG 1.8  --   --   --   --   --   --    < > = values in this interval not displayed.    Liver Function Tests: Recent Labs  Lab 09/23/23 1719 09/24/23 1639  AST 22 25  ALT 25 24  ALKPHOS 141* 125  BILITOT 0.6 0.4  PROT 7.8 6.6  ALBUMIN 3.2* 2.8*   Recent Labs  Lab 09/23/23 1719  LIPASE 50   No results for input(s): "AMMONIA" in the last 168 hours.  CBC: Recent Labs  Lab 09/24/23 0800 09/25/23 0846 09/26/23 1033 09/27/23 0847 09/28/23 1016  WBC 25.7* 10.2 9.1 9.0 8.5  HGB  12.6* 12.7* 12.6* 12.3* 13.8  HCT 38.2* 37.9* 38.2* 37.6* 42.4  MCV 77.6* 77.8* 79.4* 77.8* 77.7*  PLT 294 204 245 243 258    INR: Recent Labs  Lab 09/24/23 0800 09/25/23 0846 09/26/23 1033 09/27/23 0847 09/28/23 1447  INR 3.2* 2.9* 2.3* 2.4* 2.5*    Other results: EKG:    Imaging   No results found.   Medications:     Scheduled Medications:  allopurinol  100 mg Oral Daily   amLODipine  10 mg Oral Daily   gabapentin  300 mg Oral TID   insulin aspart  0-15 Units Subcutaneous TID WC   losartan  25 mg Oral Daily   metoCLOPramide (REGLAN) injection  10 mg Intravenous Q8H   metoprolol succinate  25 mg Oral Daily   minocycline  200 mg Oral BID   mirtazapine  15 mg Oral QHS   polyethylene glycol  17 g Oral BID   rosuvastatin  20 mg Oral Daily   senna-docusate  2 tablet Oral QHS   sertraline  50 mg Oral Daily   spironolactone  12.5 mg Oral Daily    tamsulosin  0.4 mg Oral Daily   Warfarin - Pharmacist Dosing Inpatient   Does not apply q1600    Infusions:  ceFEPime (MAXIPIME) IV Stopped (09/29/23 0330)   DAPTOmycin Stopped (09/28/23 1337)    PRN Medications: hydrALAZINE, HYDROmorphone (DILAUDID) injection, ondansetron (ZOFRAN) IV   Patient Profile   62 y.o. male with history of CAD s/p CABG, chronic systolic CHF s/p HM III VAD, chronic driveline infection, DM II, COPD, HTN, large abdominal hernia.  Presenting with severe abdominal pain X 2-3 days. Admitted for further workup and pain control.   Assessment/Plan:    1. Abdominal pain with n/v:  Associated with nausea and vomiting. CT A/P 10/15 showed possible mild ileus or enteritis, no evidence of ischemic bowel, obstruction, or VAD tunnel abscess.  Lipase and LFTs negative. Lactic acid elevated at 3.6 on admit but cleared down to 1.5. WBCs down w/ IV abx, 26>>10k-> 9k. Urine culture and BC NGTD.  Has been seen by TCTS and ID.  Suspect presentation is due to ileus + acute on chronic driveline infection. Still feels bloated.  Eating but not well.  - Cipro switched to daptomycin and cefepime due to poor po intake to cover MRSA and Pseudomonas, kept on minocycline to cover Acinetobacter.  - Encouraged small frequent meals.  - Decrease Reglan to 5 mg tid  2. 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  Cultures have grown acinetobacter, MRSA and pseudomonas He was readmitted with driveline infection in 2/24, completed course of daptomycin for MRSA.  Discharged from the hospital on 09/04/2023 with recurrent driveline infection growing Pseudomonas.   On chronic minocycline and ciprofloxacin at home as above.  Wound culture 10/1 grew MRSA. Reports compliance with home regimen. Driveline appeared stable on CT A/P this admit. Suspect persistent indolent driveline infection. Afebrile, WBCs 8.5  yesterday.   - per ID, as he improves and gets ready for discharge would switch him back over to his chronic ciprofloxacin and minocycline  - will treat with 7 days IV abx (cefepime/daptomycin) to 10/22 then switch back to home regimen - CBC today.  2.  Chronic systolic CHF s/p Heartmate 3 LVAD on 11/17/20. Echo this admit LVAD cannula in apex. Interventricular septum is midline. LVEF 20-25%, RV moderately reduced.  Not volume overloaded. Received IVF  on admit due to poor po intake with ileus. MAP elevated. INR goal 2 - 2.5.  - Continue amlodipine 10 mg daily - Continue losartan 25 mg daily.  - Continue spiro 12.5 mg daily - Continue metoprolol xl 25 mg daily - Needs INR today, continue warfarin.  3. 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 Scr 0.8>1.46>>1.28 -> 0.97. AKI in setting of infection, elevated lactic acid and suspected volume depletion. Improved with IVF.  - BMET today.  4.  CAD S/p CABG 12/20 - Continue Crestor 20 mg daily.  5.  Paroxysmal atrial fibrillation status post maze and left atrial appendage clip. NSR this admission. - Off amiodarone with hyperthyroidism. 6. Type 2 diabetes -SSI 7. Hx methamphetamine abuse:  - UDS negative 8. 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  Followed by endocrinology. He is now off amiodarone and methimazole.  9. Anemia (chronic blood loss):  Hgb has been stable - CBC today.  10. COPD: Prior smoker.  - On trelegy at home 11. Ventral hernia: Large ventral hernia, soft and nontender.  Does not appear incarcerated.  No bowel obstruction on 09/23/23 abdominal CT.  Not candidate for surgery due to proximity of hernia to driveline tunnel.    I reviewed the LVAD parameters from today, and compared the results to the patient's prior recorded data.  No programming changes were made.  The LVAD is functioning within specified parameters.  The patient performs LVAD self-test daily.  LVAD  interrogation was negative for any significant power changes, alarms or PI events/speed drops.  LVAD equipment check completed and is in good working order.  Back-up equipment present.   LVAD education done on emergency procedures and precautions and reviewed exit site care.  Length of Stay: 6  Alen Bleacher, NP 09/29/2023, 7:48 AM  VAD Team --- VAD ISSUES ONLY--- Pager [phone] (7am - 7am)  Advanced Heart Failure Team  Pager [phone] (M-F; 7a - 5p)  Please contact [facility] for night-coverage after hours (5p -7a ) and weekends on [email or web address]

## 2023-09-29 NOTE — Inpatient Diabetes Management (Signed)
Inpatient Diabetes Program Recommendations  AACE/ADA: New Consensus Statement on Inpatient Glycemic Control (2015)  Target Ranges:  Prepandial:   less than 140 mg/dL      Peak postprandial:   less than 180 mg/dL (1-2 hours)      Critically ill patients:  140 - 180 mg/dL   Lab Results  Component Value Date   GLUCAP 188 (H) 09/29/2023   HGBA1C 6.3 (H) 07/09/2023    Review of Glycemic Control  Latest Reference Range & Units 09/28/23 16:42 09/28/23 21:10 09/29/23 06:07 09/29/23 11:26  Glucose-Capillary 70 - 99 mg/dL 295 (H) 284 (H) 132 (H) 188 (H)   Diabetes history: DM 2 Outpatient Diabetes medications:  Glucotrol 5 mg daily Metformin 1000 mg bid Current orders for Inpatient glycemic control:  Novolog moderate tid with meals  Inpatient Diabetes Program Recommendations:   Please consider adding Semglee 12 units daily while in the hospital and oral DM medications on hold.   Thanks,  Beryl Meager, RN, BC-ADM Inpatient Diabetes Coordinator Pager [phone]  (8a-5p)

## 2023-09-29 NOTE — Progress Notes (Addendum)
PHARMACY - ANTICOAGULATION CONSULT NOTE  Pharmacy Consult for warfarin Indication:  HM3 LVAD  Allergies  Allergen Reactions   Firvanq [Vancomycin] Nausea And Vomiting and Other (See Comments)    Arthralgias     Patient Measurements: Height: 5\' 10"  (177.8 cm) Weight: 84.5 kg (186 lb 4.6 oz) IBW/kg (Calculated) : 73  Vital Signs: Temp: 97.9 F (36.6 C) (10/21 0817) Temp Source: Oral (10/21 0817) BP: 97/71 (10/21 0823) Pulse Rate: 101 (10/21 0817)  Labs: Recent Labs    09/26/23 1033 09/27/23 0847 09/28/23 1016 09/28/23 1447  HGB 12.6* 12.3* 13.8  --   HCT 38.2* 37.6* 42.4  --   PLT 245 243 258  --   LABPROT 25.9* 26.4*  --  27.1*  INR 2.3* 2.4*  --  2.5*  CREATININE 1.03 0.97 0.99  --     Estimated Creatinine Clearance: 79.9 mL/min (by C-G formula based on SCr of 0.99 mg/dL).   Medical History: Past Medical History:  Diagnosis Date   "    Arthritis    CAD (coronary artery disease)    a. s/p CABG in 11/2019 with LIMA-LAD, SVG-OM1, SVG-PDA and SVG-D1   CHF (congestive heart failure) (HCC)    a. EF < 20% by echo in 11/2019   Essential hypertension    PAF (paroxysmal atrial fibrillation) (HCC)    Type 2 diabetes mellitus (HCC)     Medications:  Medications Prior to Admission  Medication Sig Dispense Refill Last Dose   acetaminophen (TYLENOL) 325 MG tablet Take 2 tablets (650 mg total) by mouth every 4 (four) hours as needed for headache or mild pain.   09/22/2023   albuterol (PROVENTIL) (2.5 MG/3ML) 0.083% nebulizer solution INHALE 3 ML BY NEBULIZATION EVERY 6 HOURS AS NEEDED FOR WHEEZING OR SHORTNESS OF BREATH (Patient taking differently: Take 2.5 mg by nebulization every 6 (six) hours as needed for shortness of breath.) 450 mL 1 09/22/2023   albuterol (VENTOLIN HFA) 108 (90 Base) MCG/ACT inhaler Inhale 2 puffs into the lungs every 6 (six) hours as needed for wheezing or shortness of breath.   09/22/2023   allopurinol (ZYLOPRIM) 100 MG tablet Take 1 tablet (100  mg total) by mouth daily. 90 tablet 3 09/22/2023   amLODipine (NORVASC) 10 MG tablet Take 1 tablet (10 mg total) by mouth daily. 30 tablet 5 09/22/2023   ciprofloxacin (CIPRO) 750 MG tablet Take 1 tablet (750 mg total) by mouth 2 (two) times daily. 60 tablet 3 09/22/2023   colchicine 0.6 MG tablet Take 1 tablet (0.6 mg total) by mouth daily as needed (gout pain). (Patient taking differently: Take 0.6 mg by mouth daily.)   09/22/2023   COMBIVENT RESPIMAT 20-100 MCG/ACT AERS respimat Inhale 1 puff into the lungs every 6 (six) hours.   09/22/2023   fluticasone (FLONASE ALLERGY RELIEF) 50 MCG/ACT nasal spray Place 2 sprays into both nostrils as needed for allergies or rhinitis.   09/22/2023   gabapentin (NEURONTIN) 300 MG capsule Take 1 capsule (300 mg total) by mouth 3 (three) times daily. 90 capsule 0 09/22/2023   glipiZIDE (GLUCOTROL) 5 MG tablet Take 1 tablet (5 mg total) by mouth daily. 30 tablet 11 09/22/2023   guaifenesin (HUMIBID E) 400 MG TABS tablet Take 400 mg by mouth every 6 (six) hours as needed (for congestion).   09/22/2023   loratadine (CLARITIN) 10 MG tablet Take 1 tablet (10 mg total) by mouth daily. 30 tablet 6 09/22/2023   losartan (COZAAR) 50 MG tablet Take 1 tablet (50  mg total) by mouth daily. 30 tablet 5 09/22/2023   Menthol, Topical Analgesic, (BIOFREEZE) 10 % CREA Apply 1 Application topically 2 (two) times daily as needed (Apply to all joints for pain).   09/22/2023   metFORMIN (GLUCOPHAGE) 1000 MG tablet Take 1 tablet (1,000 mg total) by mouth 2 (two) times daily. 120 tablet 6 09/22/2023   metoprolol succinate (TOPROL XL) 25 MG 24 hr tablet Take 1 tablet (25 mg total) by mouth daily. 30 tablet 5 09/22/2023   minocycline (MINOCIN) 100 MG capsule Take 2 capsules (200 mg total) by mouth 2 (two) times daily. 120 capsule 3 09/22/2023   mirtazapine (REMERON) 15 MG tablet Take 1 tablet (15 mg total) by mouth at bedtime.   Past Week   Multiple Vitamin (MULTIVITAMIN WITH MINERALS)  TABS tablet Take 1 tablet by mouth daily.   09/22/2023   Oxycodone HCl 10 MG TABS Take 1 tablet (10 mg total) by mouth every 6 (six) hours as needed. 28 tablet 0 Past Month   pantoprazole (PROTONIX) 40 MG tablet Take 1 tablet (40 mg total) by mouth daily. 90 tablet 2 09/22/2023   rosuvastatin (CRESTOR) 20 MG tablet Take 1 tablet (20 mg total) by mouth daily. 30 tablet 5 09/22/2023   sertraline (ZOLOFT) 50 MG tablet Take 1 tablet (50 mg total) by mouth daily. (Patient taking differently: Take 50 mg by mouth at bedtime.) 90 tablet 3 Past Week   spironolactone (ALDACTONE) 25 MG tablet Take 0.5 tablets (12.5 mg total) by mouth daily. (Patient taking differently: Take 25 mg by mouth daily.) 30 tablet 5 09/22/2023   tamsulosin (FLOMAX) 0.4 MG CAPS capsule Take 1 capsule (0.4 mg total) by mouth daily. 90 capsule 3 09/22/2023   thiamine (VITAMIN B-1) 100 MG tablet Take 1 tablet (100 mg total) by mouth daily.   09/22/2023   traMADol (ULTRAM) 50 MG tablet Take 2 tablets (100 mg total) by mouth every 12 (twelve) hours as needed. 120 tablet 0 Past Week   traZODone (DESYREL) 150 MG tablet Take 1 tablet (150 mg total) by mouth at bedtime. 90 tablet 3 Past Month   TRELEGY ELLIPTA 100-62.5-25 MCG/ACT AEPB Inhale 1 puff into the lungs daily. 1 each 6 09/22/2023   warfarin (COUMADIN) 4 MG tablet Take 1-1.5 tablets (4-6 mg total) by mouth See admin instructions. Take 1 tablet (4 mg) every Tuesday and Thursday, Take 1.5 tabs (6 mg) all other days 45 tablet 11 09/22/2023 at 0800   Scheduled:   allopurinol  100 mg Oral Daily   amLODipine  10 mg Oral Daily   gabapentin  300 mg Oral TID   insulin aspart  0-15 Units Subcutaneous TID WC   losartan  25 mg Oral Daily   metoCLOPramide (REGLAN) injection  5 mg Intravenous Q8H   metoprolol succinate  25 mg Oral Daily   minocycline  200 mg Oral BID   mirtazapine  15 mg Oral QHS   polyethylene glycol  17 g Oral BID   rosuvastatin  20 mg Oral Daily   senna-docusate  2 tablet  Oral QHS   sertraline  50 mg Oral Daily   spironolactone  12.5 mg Oral Daily   tamsulosin  0.4 mg Oral Daily   Warfarin - Pharmacist Dosing Inpatient   Does not apply q1600    Assessment: Mr. Liefer is a 62YOM who was admitted for abdominal pain and pain control. Patient has a HM3 LVAD and on warfarin PTA.  Warfarin PTA regimen: 4 mg T/Th and 6  mg EOD Last dose PTA: 10/14 AM  INR today is therapeutic at 2.3. Hgb 10.8, plt 216. LDH 180. Question if INR elevation related to low oral intake on admission with abdominal pain -  eating better today but still limited yesterday. While supra-therapeutic on admission, has been subtherapeutic on outpatient INR checks previously.   Goal of Therapy:  INR 2.0-2.5 Monitor platelets by anticoagulation protocol: Yes   Plan:  Warfarin 4 mg x1 tonight Monitor INR, CBC, and bleeding daily  Thank you for allowing pharmacy to participate in this patient's care,  Sherron Monday, PharmD, BCCCP Clinical Pharmacist  Phone: [phone] 09/29/2023 9:58 AM  Please check AMION for all [facility] Pharmacy phone numbers After 10:00 PM, call Main Pharmacy [phone]

## 2023-09-29 NOTE — Progress Notes (Signed)
OT Cancellation Note  Patient Details Name: Kevin Underwood. MRN: [id number] DOB: 1961/07/12   Cancelled Treatment:    Reason Eval/Treat Not Completed: Patient declined, no reason specified (pt states he "doesn't want to do anything today" declines OT returning later, will follow for OT tx as schedule permits.)  Carver Fila, OTD, OTR/L SecureChat [facility] [phone]   [name] 09/29/2023, 1:53 PM

## 2023-09-29 NOTE — Progress Notes (Signed)
Pharmacy Antibiotic Note  Kevin Underwood. is a 62 y.o. 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 Pharmacy has been consulted for daptomycin and cefepime dosing. Noted patient was previously treated for a driveline infection in 06/2023, cultures grew acinetobacter, MSSA, and pseudomonas. Patient was treated with daptomycin, cefepime, and minocycline at that time. Patient was transitioned to a PO antibiotic maintenance regimen of minocycline + ciprofloxacin. Switching patient to IV antibiotics today due to WBC increasing up to 25.7.  Currently on day #6 of IV antibiotics. WBC 8.8, afebrile. Scr 0.96 (CrCl 82 mL/min).   Plan: Daptomycin 700 mg IV daily through 10/22 Cefepime 2 gm IV every 8 hours through 10/22 Restart ciprofloxacin 750 mg PO BID starting 10/23 Continue minocycline 200 mg PO BID Monitor renal function, cx results, and clinical signs of improvement  Height: 5\' 10"  (177.8 cm) Weight: 84.5 kg (186 lb 4.6 oz) IBW/kg (Calculated) : 73  Temp (24hrs), Avg:98.1 F (36.7 C), Min:97.6 F (36.4 C), Max:98.5 F (36.9 C)  Recent Labs  Lab 09/24/23 0800 09/24/23 1639 09/24/23 2019 09/25/23 0846 09/25/23 1136 09/25/23 1515 09/26/23 1033 09/27/23 0847 09/28/23 1016 09/29/23 0929  WBC 25.7*  --   --  10.2  --   --  9.1 9.0 8.5 8.8  CREATININE 1.46* 1.44*  --  1.28*  --   --  1.03 0.97 0.99 0.96  LATICACIDVEN 3.3* 2.7* 2.8*  --  2.0* 1.5  --   --   --   --     Estimated Creatinine Clearance: 82.4 mL/min (by C-G formula based on SCr of 0.96 mg/dL).    Allergies  Allergen Reactions   Firvanq [Vancomycin] Nausea And Vomiting and Other (See Comments)    Arthralgias     Antimicrobials this admission: PTA Ciprofloxacin >> 10/15, [10/23] >> PTA Minocycline >> Dapto 10/16 >> [10/22] Cefepime 10/16>> [10/22]  Microbiology results: 10/16 Ucx: ng  10/15 BCx: no growth <24 hours 10/15 Resp panel:  negative  Thank you for allowing pharmacy to participate in this patient's care,  Sherron Monday, PharmD, BCCCP Clinical Pharmacist  Phone: [phone] 09/29/2023 2:50 PM  Please check AMION for all [facility] Pharmacy phone numbers After 10:00 PM, call Main Pharmacy [phone]

## 2023-09-29 NOTE — Plan of Care (Signed)
  Problem: Education: Goal: Patient will understand all VAD equipment and how it functions Outcome: Progressing Goal: Patient will be able to verbalize current INR target range and antiplatelet therapy for discharge home Outcome: Progressing   Problem: Cardiac: Goal: LVAD will function as expected and patient will experience no clinical alarms Outcome: Progressing   Problem: Education: Goal: Knowledge of General Education information will improve Description: Including pain rating scale, medication(s)/side effects and non-pharmacologic comfort measures Outcome: Progressing   Problem: Health Behavior/Discharge Planning: Goal: Ability to manage health-related needs will improve Outcome: Progressing   Problem: Clinical Measurements: Goal: Ability to maintain clinical measurements within normal limits will improve Outcome: Progressing Goal: Will remain free from infection Outcome: Progressing Goal: Diagnostic test results will improve Outcome: Progressing Goal: Respiratory complications will improve Outcome: Progressing Goal: Cardiovascular complication will be avoided Outcome: Progressing   Problem: Nutrition: Goal: Adequate nutrition will be maintained Outcome: Progressing   Problem: Coping: Goal: Level of anxiety will decrease Outcome: Progressing   Problem: Elimination: Goal: Will not experience complications related to bowel motility Outcome: Progressing Goal: Will not experience complications related to urinary retention Outcome: Progressing   Problem: Pain Managment: Goal: General experience of comfort will improve Outcome: Progressing   Problem: Safety: Goal: Ability to remain free from injury will improve Outcome: Progressing   Problem: Skin Integrity: Goal: Risk for impaired skin integrity will decrease Outcome: Progressing   Problem: Coping: Goal: Ability to adjust to condition or change in health will improve Outcome: Progressing   Problem: Fluid  Volume: Goal: Ability to maintain a balanced intake and output will improve Outcome: Progressing   Problem: Health Behavior/Discharge Planning: Goal: Ability to identify and utilize available resources and services will improve Outcome: Progressing Goal: Ability to manage health-related needs will improve Outcome: Progressing   Problem: Metabolic: Goal: Ability to maintain appropriate glucose levels will improve Outcome: Progressing   Problem: Nutritional: Goal: Maintenance of adequate nutrition will improve Outcome: Progressing Goal: Progress toward achieving an optimal weight will improve Outcome: Progressing   Problem: Skin Integrity: Goal: Risk for impaired skin integrity will decrease Outcome: Progressing

## 2023-09-29 NOTE — Progress Notes (Signed)
LVAD Coordinator Rounding Note:  Admitted 09/23/23 to Heart Failure service from [facility] with worsening abdominal pain and nausea x 2-3 days.   CT abd/pelvis 09/23/23 1. Mild fluid distension of left upper quadrant jejunal bowel loops without discrete transition point suggestive of mild ileus or enteritis. 2. Otherwise no CT evidence for acute intra-abdominal or pelvic abnormality. LVAD with similar skin thickening and circumferential soft tissue thickening about the drive line in the upper abdomen without development of new fluid around the drive line since the prior exam. 3. 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 Aortic atherosclerosis.  Pt laying in bed on my arrival. States he is feeling good this morning. Received 1 L of LR over the weekend. Pts flow this morning is 3.7 but when he sat up it dropped to 3.1.  Echo completed 10/17. EF 20-25 % RV moderately reduced.   WBC 8.8 today. 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 Blood cxs 10/15 NGTD. ID team following. Currently on IV Daptomycin 700 mg daily, IV Cefepime 2g q 8 hrs, and PO Minocycline.    Vital signs: Temp: 97.9 HR: 100 Doppler Pressure: 98 Automatic BP: 97/71 (81) O2 Sat: 98% on RA Wt: 185.6>186.7>186.2 lbs  LVAD interrogation reveals:  Speed: 5600 Flow: 3.7 Power: 4.1 w PI: 8.7  Alarms: none Events: >100 PI events today Hct: 32  Fixed speed:  5600 Low speed limit:  5300  Drive Line: Existing VAD dressing removed and site care performed using sterile technique by Dr. Maren Beach. Wound bed cleansed with VASHE solution. Skin surrounding wound bed cleaned with Chlora prep applicators x 2, allowed to dry. Followed by VASHE soaked 2x2 flush to exit site and then covered with several dry 4 x 4s. Drive line partially incorporated. Small amount of thick yellow/serosanguinous drainage noted. No  redness, tenderness, foul odor or rash noted. Ulcerated area distal to drive line cleansed with VASHE solution. VASHE moistened 2 x 2 placed over area and covered with gauze. 2 brown rectangular mepitel dressings applied over reddened skin/skin tears surrounding dressing prior to covering entire area with 3 large tegaderms. Drive line anchor reapplied on top of mepitel dressing to prevent skin breakdown. Continue daily wet to dry dressing changes using VASHE solution per bedside RN or VAD coordinator. Next dressing change due 09/30/23.      Pt had previous drive line debridement in OR with placement of instillation wound vac on 07/09/23 with Dr Donata Clay. This was changed to wet/dry on Friday 8/2. No further debridement of the VAD tunnel would be possible without injury to the outflow graft superiorly or the peritoneal cavity inferior to the power cord per Dr Donata Clay.   Labs:  LDH trend: 149>200  INR trend: 3.2>2.9>2.3  WBC trend: 16>26>10.2>9.1  Lactic acid trend: 3.6>2.6>3.3>2.7>2.8>2.0>1.5  Anticoagulation Plan: -INR Goal: 2.0 - 2.5 -ASA Dose: none  ICD: N/A  Infection:  09/23/23>>resp panel>>negative 09/23/23>> blood cxs>> no growth 3 days; final pending 09/24/23>>urine cx>> no growth; final  Drips:    Plan/Recommendations:  Page VAD coordinator with equipment issues or driveline problems Daily drive line wet to dry dressing changes with VASHE solution per bedside RN.   Carlton Adam RN VAD Coordinator  Office: [phone]  24/7 Pager: [phone]

## 2023-09-30 DIAGNOSIS — T827XXA Infection and inflammatory reaction due to other cardiac and vascular devices, implants and grafts, initial encounter: Secondary | ICD-10-CM | POA: Diagnosis not present

## 2023-09-30 LAB — CBC
HCT: 34.5 % — ABNORMAL LOW (ref 39.0–52.0)
Hemoglobin: 11.3 g/dL — ABNORMAL LOW (ref 13.0–17.0)
MCH: 25.8 pg — ABNORMAL LOW (ref 26.0–34.0)
MCHC: 32.8 g/dL (ref 30.0–36.0)
MCV: 78.8 fL — ABNORMAL LOW (ref 80.0–100.0)
Platelets: 266 10*3/uL (ref 150–400)
RBC: 4.38 MIL/uL (ref 4.22–5.81)
RDW: 14.4 % (ref 11.5–15.5)
WBC: 11.6 10*3/uL — ABNORMAL HIGH (ref 4.0–10.5)
nRBC: 0 % (ref 0.0–0.2)

## 2023-09-30 LAB — BASIC METABOLIC PANEL
Anion gap: 13 (ref 5–15)
BUN: 18 mg/dL (ref 8–23)
CO2: 24 mmol/L (ref 22–32)
Calcium: 8.9 mg/dL (ref 8.9–10.3)
Chloride: 96 mmol/L — ABNORMAL LOW (ref 98–111)
Creatinine, Ser: 0.76 mg/dL (ref 0.61–1.24)
GFR, Estimated: >60 mL/min (ref >60)
Glucose, Bld: 172 mg/dL — ABNORMAL HIGH (ref 70–99)
Potassium: 4.2 mmol/L (ref 3.5–5.1)
Sodium: 133 mmol/L — ABNORMAL LOW (ref 135–145)

## 2023-09-30 LAB — GLUCOSE, CAPILLARY
Glucose-Capillary: 190 mg/dL — ABNORMAL HIGH (ref 70–99)
Glucose-Capillary: 264 mg/dL — ABNORMAL HIGH (ref 70–99)
Glucose-Capillary: 139 mg/dL — ABNORMAL HIGH (ref 70–99)
Glucose-Capillary: 205 mg/dL — ABNORMAL HIGH (ref 70–99)

## 2023-09-30 LAB — PROTIME-INR
INR: 2 — ABNORMAL HIGH (ref 0.8–1.2)
Prothrombin Time: 22.9 s — ABNORMAL HIGH (ref 11.4–15.2)

## 2023-09-30 LAB — LACTATE DEHYDROGENASE: LDH: 156 U/L (ref 98–192)

## 2023-09-30 MED ORDER — WARFARIN SODIUM 4 MG PO TABS
4.0000 mg | ORAL_TABLET | Freq: Once | ORAL | Status: AC
  Administered 2023-09-30: 4 mg via ORAL
  Filled 2023-09-30: qty 1

## 2023-09-30 MED ORDER — LOSARTAN POTASSIUM 50 MG PO TABS
50.0000 mg | ORAL_TABLET | Freq: Every day | ORAL | Status: DC
  Administered 2023-09-30 – 2023-10-02 (×3): 50 mg via ORAL
  Filled 2023-09-30 (×3): qty 1

## 2023-09-30 NOTE — Progress Notes (Signed)
Referral to [facility] accepted. VAD Coordinator spoke with referral coordinator Ladean Raya who has attempted to call pt; however, pt has lost cellphone. Pt's wife [name] given contact information to schedule appointment. Pt made aware.  Simmie Davies RN, BSN VAD Coordinator 24/7 Pager [phone]

## 2023-09-30 NOTE — Progress Notes (Signed)
PHARMACY - ANTICOAGULATION CONSULT NOTE  Pharmacy Consult for warfarin Indication:  HM3 LVAD  Allergies  Allergen Reactions   Firvanq [Vancomycin] Nausea And Vomiting and Other (See Comments)    Arthralgias     Patient Measurements: Height: 5\' 10"  (177.8 cm) Weight: 85.6 kg (188 lb 11.4 oz) IBW/kg (Calculated) : 73  Vital Signs: Temp: 97.7 F (36.5 C) (10/22 0738) Temp Source: Oral (10/22 0738) BP: 111/91 (10/22 0738) Pulse Rate: 90 (10/22 0409)  Labs: Recent Labs    09/28/23 1016 09/28/23 1447 09/29/23 0929 09/30/23 0949  HGB 13.8  --  10.8* 11.3*  HCT 42.4  --  32.9* 34.5*  PLT 258  --  216 266  LABPROT  --  27.1* 25.8* 22.9*  INR  --  2.5* 2.3* 2.0*  CREATININE 0.99  --  0.96  --     Estimated Creatinine Clearance: 82.4 mL/min (by C-G formula based on SCr of 0.96 mg/dL).   Medical History: Past Medical History:  Diagnosis Date   "    Arthritis    CAD (coronary artery disease)    a. s/p CABG in 11/2019 with LIMA-LAD, SVG-OM1, SVG-PDA and SVG-D1   CHF (congestive heart failure) (HCC)    a. EF < 20% by echo in 11/2019   Essential hypertension    PAF (paroxysmal atrial fibrillation) (HCC)    Type 2 diabetes mellitus (HCC)     Medications:  Medications Prior to Admission  Medication Sig Dispense Refill Last Dose   acetaminophen (TYLENOL) 325 MG tablet Take 2 tablets (650 mg total) by mouth every 4 (four) hours as needed for headache or mild pain.   09/22/2023   albuterol (PROVENTIL) (2.5 MG/3ML) 0.083% nebulizer solution INHALE 3 ML BY NEBULIZATION EVERY 6 HOURS AS NEEDED FOR WHEEZING OR SHORTNESS OF BREATH (Patient taking differently: Take 2.5 mg by nebulization every 6 (six) hours as needed for shortness of breath.) 450 mL 1 09/22/2023   albuterol (VENTOLIN HFA) 108 (90 Base) MCG/ACT inhaler Inhale 2 puffs into the lungs every 6 (six) hours as needed for wheezing or shortness of breath.   09/22/2023   allopurinol (ZYLOPRIM) 100 MG tablet Take 1 tablet (100  mg total) by mouth daily. 90 tablet 3 09/22/2023   amLODipine (NORVASC) 10 MG tablet Take 1 tablet (10 mg total) by mouth daily. 30 tablet 5 09/22/2023   ciprofloxacin (CIPRO) 750 MG tablet Take 1 tablet (750 mg total) by mouth 2 (two) times daily. 60 tablet 3 09/22/2023   colchicine 0.6 MG tablet Take 1 tablet (0.6 mg total) by mouth daily as needed (gout pain). (Patient taking differently: Take 0.6 mg by mouth daily.)   09/22/2023   COMBIVENT RESPIMAT 20-100 MCG/ACT AERS respimat Inhale 1 puff into the lungs every 6 (six) hours.   09/22/2023   fluticasone (FLONASE ALLERGY RELIEF) 50 MCG/ACT nasal spray Place 2 sprays into both nostrils as needed for allergies or rhinitis.   09/22/2023   gabapentin (NEURONTIN) 300 MG capsule Take 1 capsule (300 mg total) by mouth 3 (three) times daily. 90 capsule 0 09/22/2023   glipiZIDE (GLUCOTROL) 5 MG tablet Take 1 tablet (5 mg total) by mouth daily. 30 tablet 11 09/22/2023   guaifenesin (HUMIBID E) 400 MG TABS tablet Take 400 mg by mouth every 6 (six) hours as needed (for congestion).   09/22/2023   loratadine (CLARITIN) 10 MG tablet Take 1 tablet (10 mg total) by mouth daily. 30 tablet 6 09/22/2023   losartan (COZAAR) 50 MG tablet Take 1  tablet (50 mg total) by mouth daily. 30 tablet 5 09/22/2023   Menthol, Topical Analgesic, (BIOFREEZE) 10 % CREA Apply 1 Application topically 2 (two) times daily as needed (Apply to all joints for pain).   09/22/2023   metFORMIN (GLUCOPHAGE) 1000 MG tablet Take 1 tablet (1,000 mg total) by mouth 2 (two) times daily. 120 tablet 6 09/22/2023   metoprolol succinate (TOPROL XL) 25 MG 24 hr tablet Take 1 tablet (25 mg total) by mouth daily. 30 tablet 5 09/22/2023   minocycline (MINOCIN) 100 MG capsule Take 2 capsules (200 mg total) by mouth 2 (two) times daily. 120 capsule 3 09/22/2023   mirtazapine (REMERON) 15 MG tablet Take 1 tablet (15 mg total) by mouth at bedtime.   Past Week   Multiple Vitamin (MULTIVITAMIN WITH MINERALS)  TABS tablet Take 1 tablet by mouth daily.   09/22/2023   Oxycodone HCl 10 MG TABS Take 1 tablet (10 mg total) by mouth every 6 (six) hours as needed. 28 tablet 0 Past Month   pantoprazole (PROTONIX) 40 MG tablet Take 1 tablet (40 mg total) by mouth daily. 90 tablet 2 09/22/2023   rosuvastatin (CRESTOR) 20 MG tablet Take 1 tablet (20 mg total) by mouth daily. 30 tablet 5 09/22/2023   sertraline (ZOLOFT) 50 MG tablet Take 1 tablet (50 mg total) by mouth daily. (Patient taking differently: Take 50 mg by mouth at bedtime.) 90 tablet 3 Past Week   spironolactone (ALDACTONE) 25 MG tablet Take 0.5 tablets (12.5 mg total) by mouth daily. (Patient taking differently: Take 25 mg by mouth daily.) 30 tablet 5 09/22/2023   tamsulosin (FLOMAX) 0.4 MG CAPS capsule Take 1 capsule (0.4 mg total) by mouth daily. 90 capsule 3 09/22/2023   thiamine (VITAMIN B-1) 100 MG tablet Take 1 tablet (100 mg total) by mouth daily.   09/22/2023   traMADol (ULTRAM) 50 MG tablet Take 2 tablets (100 mg total) by mouth every 12 (twelve) hours as needed. 120 tablet 0 Past Week   traZODone (DESYREL) 150 MG tablet Take 1 tablet (150 mg total) by mouth at bedtime. 90 tablet 3 Past Month   TRELEGY ELLIPTA 100-62.5-25 MCG/ACT AEPB Inhale 1 puff into the lungs daily. 1 each 6 09/22/2023   warfarin (COUMADIN) 4 MG tablet Take 1-1.5 tablets (4-6 mg total) by mouth See admin instructions. Take 1 tablet (4 mg) every Tuesday and Thursday, Take 1.5 tabs (6 mg) all other days 45 tablet 11 09/22/2023 at 0800   Scheduled:   allopurinol  100 mg Oral Daily   amLODipine  10 mg Oral Daily   [START ON 10/01/2023] ciprofloxacin  750 mg Oral BID   gabapentin  300 mg Oral TID   insulin aspart  0-15 Units Subcutaneous TID WC   insulin glargine-yfgn  12 Units Subcutaneous Daily   losartan  50 mg Oral Daily   metoprolol succinate  25 mg Oral Daily   minocycline  200 mg Oral BID   mirtazapine  15 mg Oral QHS   polyethylene glycol  17 g Oral BID    rosuvastatin  20 mg Oral Daily   senna-docusate  2 tablet Oral QHS   sertraline  50 mg Oral Daily   spironolactone  12.5 mg Oral Daily   tamsulosin  0.4 mg Oral Daily   Warfarin - Pharmacist Dosing Inpatient   Does not apply q1600    Assessment: Mr. Bordonaro is a 62YOM who was admitted for abdominal pain and pain control. Patient has a HM3 LVAD and  on warfarin PTA.  Warfarin PTA regimen: 4 mg T/Th and 6 mg EOD Last dose PTA: 10/14 AM  INR today is borderline subtherapeutic at 2.0. Hgb 11.3, plt 266. Question if INR elevation on admission related to low oral intake with abdominal pain -  eating better today. Noted diet typically consists of high amounts of greens at home. While supra-therapeutic on admission, has been subtherapeutic on outpatient INR checks previously.   Goal of Therapy:  INR 2.0-2.5 Monitor platelets by anticoagulation protocol: Yes   Plan:  Warfarin 4 mg x1 tonight Monitor INR, CBC, and bleeding daily  Thank you for allowing pharmacy to participate in this patient's care,  Enos Fling, PharmD PGY-1 Acute Care Pharmacy Resident 09/30/2023 11:23 AM  Please check AMION for all [facility] phone numbers After 10:00 PM, call Main Pharmacy [phone]

## 2023-09-30 NOTE — Progress Notes (Signed)
LVAD Coordinator Rounding Note:  Admitted 09/23/23 to Heart Failure service from [facility] with worsening abdominal pain and nausea x 2-3 days.   CT abd/pelvis 09/23/23 1. Mild fluid distension of left upper quadrant jejunal bowel loops without discrete transition point suggestive of mild ileus or enteritis. 2. Otherwise no CT evidence for acute intra-abdominal or pelvic abnormality. LVAD with similar skin thickening and circumferential soft tissue thickening about the drive line in the upper abdomen without development of new fluid around the drive line since the prior exam. 3. 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 Aortic atherosclerosis.  Pt lying in bed on my arrival. States he is feeling good this morning but continues to have poor po intake. Pt states he continues to have abdominal pain especially when he eats and constantly feels bloated. Consult to registered dietitian placed for further assessment.   Echo completed 10/17. EF 20-25 % RV moderately reduced.   WBC 8.8 today. 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 Blood cxs 10/15 NGTD. ID team following. Currently on IV Daptomycin 700 mg daily, IV Cefepime 2g q 8 hrs, and PO Minocycline.    Vital signs: Temp: 97.7 HR: 98 Doppler Pressure: 100 Automatic BP: 111/91 (100) O2 Sat: 98% on RA Wt: 185.6>186.7>186.2>188.7 lbs  LVAD interrogation reveals:  Speed: 5600 Flow: 4.3 Power: 4.2 w PI: 4.3  Alarms: none Events: 45 PI events today Hct: 32  Fixed speed:  5600 Low speed limit:  5300  Drive Line: Existing VAD dressing removed and site care performed using sterile technique by Dr. Maren Beach. Wound bed cleansed with VASHE solution. Skin surrounding wound bed cleaned with Chlora prep applicators x 2, allowed to dry. Followed by VASHE soaked 2x2 flush to exit site and then covered with several dry 4 x 4s.  Drive line partially incorporated. Small amount of thick yellow/serosanguinous drainage noted. No redness, tenderness, foul odor or rash noted. Ulcerated area distal to drive line cleansed with VASHE solution. VASHE moistened 2 x 2 placed over area and covered with gauze. 2 brown rectangular mepitel dressings applied over reddened skin/skin tears surrounding dressing prior to covering entire area with 3 large tegaderms. Drive line anchor reapplied on top of mepitel dressing to prevent skin breakdown. Continue daily wet to dry dressing changes using VASHE solution per bedside RN or VAD coordinator. Next dressing change due 10/01/23.      Pt had previous drive line debridement in OR with placement of instillation wound vac on 07/09/23 with Dr Donata Clay. This was changed to wet/dry on Friday 8/2. No further debridement of the VAD tunnel would be possible without injury to the outflow graft superiorly or the peritoneal cavity inferior to the power cord per Dr Donata Clay.   Labs:  LDH trend: 149>200  INR trend: 3.2>2.9>2.3  WBC trend: 16>26>10.2>9.1>11.6  Lactic acid trend: 3.6>2.6>3.3>2.7>2.8>2.0>1.5  Anticoagulation Plan: -INR Goal: 2.0 - 2.5 -ASA Dose: none  ICD: N/A  Infection:  09/23/23>>resp panel>>negative 09/23/23>> blood cxs>> no growth 3 days; final pending 09/24/23>>urine cx>> no growth; final  Drips:    Plan/Recommendations:  Page VAD coordinator with equipment issues or driveline problems Daily drive line wet to dry dressing changes with VASHE solution per bedside RN.   Simmie Davies RN,BSN VAD Coordinator  Office: [phone]  24/7 Pager: [phone]

## 2023-09-30 NOTE — Progress Notes (Signed)
Initial Nutrition Assessment  DOCUMENTATION CODES:   Severe malnutrition in context of chronic illness  INTERVENTION:   - Ensure Enlive po TID, each supplement provides 350 kcal and 20 grams of protein, pt prefers chocolate flavor  - Liberalize diet to 2 gram sodium to provide more options at meals  - MVI with minerals daily  NUTRITION DIAGNOSIS:   Severe Malnutrition related to chronic illness (HF s/p Heartmate 3 LVAD, COPD, chronic driveline infections) as evidenced by moderate fat depletion, severe muscle depletion, percent weight loss (11.4% weight loss in < 2 months).  GOAL:   Patient will meet greater than or equal to 90% of their needs  MONITOR:   PO intake, Supplement acceptance, Labs, Weight trends, I & O's  REASON FOR ASSESSMENT:   Consult Assessment of nutrition requirement/status, Wound healing, Poor PO, Diet education  ASSESSMENT:   62 year old male who presented to the ED on 10/15 from VAD clinic with abdominal pain, N/V/D x 2 days. PMH of systolic HF s/p Heartmate 3 LVAD, multivessel CAD s/p CABG December 202 (with Maze and LAA clipping), atrial fibrillation, T2DM, COPD, HTN, chronic driveline infections.  10/15 - CT A/P showing possible mild ileus or enteritis, no acute intra-abdominal or pelvic abnormality, large abdominal wall hernia with no evidence of obstruction or incarceration 10/16 - clear liquid diet, later advanced to full liquid diet 10/17 - diet advanced to Heart Healthy/Carb Modified  RD consulted for assessment, wound healing, diet education, and poor PO intake. Echocardiogram shows no evidence of endocarditis or vegetations. CT scan of the C-spine shows no evidence of discitis. Blood cultures remain negative. Pt has completed IV antibiotics and is now on oral antibiotics. Pt has been requiring IV pain medication for pain management which is a current barrier to discharge.  Spoke with pt and wife at bedside. Pt reports that he is still  experiencing poor appetite, early satiety, and nausea. He states that he is able to eat a few bites of each of the foods on his meal trays prior to becoming extremely full and not being able to eat more. Pt reports that this has been the case for him for a long time and attributes this issue to his large abdominal wall hernia. Pt is thought to be a very poor surgical candidate for this due to proximity to driveline.  Pt reports that at home, he typically snacks on and off throughout the day. He mostly snacks on canned fruits and does not seem to consume a lot of protein foods. He reports early satiety and nausea are also the main barriers to eating when he is at home. Pt does take a MVI at home. RD will order for inpatient as well.  Discussed with pt that he had previously been provided with oral nutrition supplements in prior admissions. Pt reports that he can only drink about 50% of a supplement at a time. Encouraged pt to drink as much as he can of supplements between meals.  Pt is unsure if he has lost any weight. He does not know his UBW. He only wears stretchy clothes so is unsure of whether his clothes are fitting differently. Current weight is consistent with admission weight. Reviewed weight history in chart. Pt's weight has fluctuated over the last year but has been consistently trending down over the last 2 months. Overall, pt has experienced an 11 kg weight loss since 08/15/23. This is an 11.4% weight loss in less than 2 months which is severe and significant for timeframe. Difficult  to determine how much of weight loss is related to fluid status, but suspect true dry weight loss is present given NFPE findings. Pt meets criteria for severe malnutrition based on NFPE and weight loss.  Will liberalize diet to 2 gram sodium to provide additional options at meals. Discussed importance of eating high-protein, high-calorie food items first. Encouraged smaller, more frequent meals and snacks.  Pt  reports issues with constipation and states that he has to take fiber every morning. He reports not having a BM yesterday but that he had one this morning and feels a lot better.  Admit weight: 84.7 kg Current weight: 85.8 kg  Meal Completion: 10/19: 90%, 75%, 75% 10/20: 40%, 0% 10:21: 100% 10/22: 90%, 100% 10/23: 100%  Medications reviewed and include: SSI, semglee 12 units daily, remeron 15 mg daily, miralax, senna, spironolactone, warfarin  Labs reviewed: sodium 131 CBG's: 139-264 x 24 hours  UOP: 2300 ml x 24 hours  NUTRITION - FOCUSED PHYSICAL EXAM:  Flowsheet Row Most Recent Value  Orbital Region Moderate depletion  Upper Arm Region Moderate depletion  Thoracic and Lumbar Region Severe depletion  Buccal Region Moderate depletion  Temple Region Severe depletion  Clavicle Bone Region Severe depletion  Clavicle and Acromion Bone Region Severe depletion  Scapular Bone Region Moderate depletion  Dorsal Hand Moderate depletion  Patellar Region Moderate depletion  Anterior Thigh Region Moderate depletion  Posterior Calf Region Moderate depletion  Edema (RD Assessment) None  Hair Reviewed  Eyes Reviewed  Mouth Reviewed  Skin Reviewed  Nails Reviewed    Diet Order:   Diet Order             Diet 2 gram sodium Room service appropriate? Yes with Assist; Fluid consistency: Thin  Diet effective now                   EDUCATION NEEDS:   Education needs have been addressed  Skin:  Skin Assessment: Reviewed RN Assessment  Last BM:  09/29/23  Height:   Ht Readings from Last 1 Encounters:  09/23/23 5\' 10"  (1.778 m)    Weight:   Wt Readings from Last 1 Encounters:  10/01/23 85.8 kg    Ideal Body Weight:  75.5 kg  BMI:  Body mass index is 27.14 kg/m.  Estimated Nutritional Needs:   Kcal:  2200-2400  Protein:  110-130 grams  Fluid:  2.0 L    Mertie Clause, MS, RD, LDN Registered Dietitian II Please see AMiON for contact information.

## 2023-09-30 NOTE — TOC Progression Note (Signed)
Transition of Care (TOC) - Progression Note    Patient Details  Name: Kevin Underwood. MRN: [id number] Date of Birth: 1961/07/02  Transition of Care [facility]) CM/SW Contact  Nicanor Bake Phone Number: [phone] 09/30/2023, 12:20 PM  Clinical Narrative:   [facility] CSW called and scheduled pts Hospital follow up appointment scheduled for Wednesday, October 22, 2023 at 1:00 PM.   TOC will continue following.        Expected Discharge Plan and Services                                               Social Determinants of Health (SDOH) Interventions SDOH Screenings   Food Insecurity: No Food Insecurity (09/23/2023)  Housing: Low Risk  (09/23/2023)  Transportation Needs: No Transportation Needs (09/23/2023)  Recent Concern: Transportation Needs - Unmet Transportation Needs (09/08/2023)  Utilities: Not At Risk (09/23/2023)  Alcohol Screen: Low Risk  (11/10/2020)  Depression (PHQ2-9): Low Risk  (10/16/2022)  Financial Resource Strain: Medium Risk (05/17/2022)  Physical Activity: Inactive (11/10/2020)  Tobacco Use: High Risk (09/23/2023)    Readmission Risk Interventions    07/01/2022    9:05 AM  Readmission Risk Prevention Plan  Transportation Screening Complete  Medication Review (RN Care Manager) Complete  PCP or Specialist appointment within 3-5 days of discharge Complete  HRI or Home Care Consult Complete  SW Recovery Care/Counseling Consult Complete  Palliative Care Screening Not Applicable  Skilled Nursing Facility Not Applicable

## 2023-09-30 NOTE — Progress Notes (Signed)
Patient ID: Kevin Rigg., male   DOB: 1960-12-31, 62 y.o.   MRN: [id number]   Advanced Heart Failure VAD Team Note  PCP-Cardiologist: Nona Dell, MD   Subjective:   CT A/P 10/15: Possible mild ileus or enteritis, no evidence of ischemic bowel, obstruction, or VAD tunnel abscess  CT of Neck (neck pain) did not show any acute process.  No labs yet today.   Home cipro switched to cefepime and daptomycin, remains on po minocycline. WBCs down 26>>10>>9>>8.9>>8.8 yesterday. Afebrile. Cultures negative  MAPs 80-100 today  Feels better today. Walked around yesterday, no issues. Able to eat most of his food.   LVAD INTERROGATION:  HeartMate III LVAD:   Flow 4.2 liters/min, speed 5600, power 4.2  PI 3.8.  VAD interrogated personally >100 PI events so far today  Objective:    Vital Signs:   Temp:  [97.6 F (36.4 C)-98.6 F (37 C)] 97.7 F (36.5 C) (10/22 0738) Pulse Rate:  [86-101] 90 (10/22 0409) Resp:  [12-16] 13 (10/22 0738) BP: (89-130)/(71-99) 111/91 (10/22 0738) SpO2:  [98 %-99 %] 98 % (10/22 0738) Weight:  [85.6 kg] 85.6 kg (10/22 0409) Last BM Date : 09/27/23 Mean arterial Pressure 80s-100  Intake/Output:   Intake/Output Summary (Last 24 hours) at 09/30/2023 0800 Last data filed at 09/30/2023 0741 Gross per 24 hour  Intake 3146.93 ml  Output 1800 ml  Net 1346.93 ml     Physical Exam  General:  Well appearing. No resp difficulty HEENT: Normal Neck: supple. JVP flat. Carotids 2+ bilat; no bruits. No lymphadenopathy or thyromegaly appreciated. Cor: Mechanical heart sounds with LVAD hum present. Lungs: Clear Abdomen: soft, nontender, distended. No hepatosplenomegaly. No bruits or masses. Good bowel sounds. Large abd hernia.  Driveline: C/D/I; securement device intact and driveline incorporated Extremities: no cyanosis, clubbing, rash, edema Neuro: alert & orientedx3, cranial nerves grossly intact. moves all 4 extremities w/o difficulty. Affect pleasant    Telemetry   ST low 100s (Personally reviewed)    Labs   Basic Metabolic Panel: Recent Labs  Lab 09/23/23 1719 09/24/23 0800 09/25/23 0846 09/26/23 1033 09/27/23 0847 09/28/23 1016 09/29/23 0929  NA  --    < > 130* 132* 132* 133* 131*  K  --    < > 4.3 4.3 4.1 4.0 4.5  CL  --    < > 95* 98 101 98 101  CO2  --    < > 19* 23 23 23 25   GLUCOSE  --    < > 214* 158* 179* 205* 256*  BUN  --    < > 35* 27* 22 19 20   CREATININE  --    < > 1.28* 1.03 0.97 0.99 0.96  CALCIUM  --    < > 8.4* 8.4* 8.1* 9.1 7.8*  MG 1.8  --   --   --   --   --   --    < > = values in this interval not displayed.    Liver Function Tests: Recent Labs  Lab 09/23/23 1719 09/24/23 1639  AST 22 25  ALT 25 24  ALKPHOS 141* 125  BILITOT 0.6 0.4  PROT 7.8 6.6  ALBUMIN 3.2* 2.8*   Recent Labs  Lab 09/23/23 1719  LIPASE 50   No results for input(s): "AMMONIA" in the last 168 hours.  CBC: Recent Labs  Lab 09/25/23 0846 09/26/23 1033 09/27/23 0847 09/28/23 1016 09/29/23 0929  WBC 10.2 9.1 9.0 8.5 8.8  HGB 12.7* 12.6* 12.3*  13.8 10.8*  HCT 37.9* 38.2* 37.6* 42.4 32.9*  MCV 77.8* 79.4* 77.8* 77.7* 78.1*  PLT 204 245 243 258 216    INR: Recent Labs  Lab 09/25/23 0846 09/26/23 1033 09/27/23 0847 09/28/23 1447 09/29/23 0929  INR 2.9* 2.3* 2.4* 2.5* 2.3*    Other results: EKG:    Imaging   No results found.   Medications:     Scheduled Medications:  allopurinol  100 mg Oral Daily   amLODipine  10 mg Oral Daily   [START ON 10/01/2023] ciprofloxacin  750 mg Oral BID   gabapentin  300 mg Oral TID   insulin aspart  0-15 Units Subcutaneous TID WC   insulin glargine-yfgn  12 Units Subcutaneous Daily   losartan  25 mg Oral Daily   metoCLOPramide (REGLAN) injection  5 mg Intravenous Q8H   metoprolol succinate  25 mg Oral Daily   minocycline  200 mg Oral BID   mirtazapine  15 mg Oral QHS   polyethylene glycol  17 g Oral BID   rosuvastatin  20 mg Oral Daily   senna-docusate   2 tablet Oral QHS   sertraline  50 mg Oral Daily   spironolactone  12.5 mg Oral Daily   tamsulosin  0.4 mg Oral Daily   Warfarin - Pharmacist Dosing Inpatient   Does not apply q1600    Infusions:  ceFEPime (MAXIPIME) IV Stopped (09/30/23 0440)   DAPTOmycin Stopped (09/29/23 1448)    PRN Medications: hydrALAZINE, HYDROmorphone (DILAUDID) injection, ondansetron (ZOFRAN) IV   Patient Profile   62 y.o. male with history of CAD s/p CABG, chronic systolic CHF s/p HM III VAD, chronic driveline infection, DM II, COPD, HTN, large abdominal hernia.  Presenting with severe abdominal pain X 2-3 days. Admitted for further workup and pain control.   Assessment/Plan:   1. Abdominal pain with n/v:  Associated with nausea and vomiting. CT A/P 10/15 showed possible mild ileus or enteritis, no evidence of ischemic bowel, obstruction, or VAD tunnel abscess.  Lipase and LFTs negative. Lactic acid elevated at 3.6 on admit but cleared down to 1.5. WBCs down w/ IV abx, 26>>10k-> 9k. Urine culture and BC NGTD.  Has been seen by TCTS and ID.  Suspect presentation is due to ileus + acute on chronic driveline infection. Still feels bloated.  Eating but not well.  - Cipro switched to daptomycin and cefepime due to poor po intake to cover MRSA and Pseudomonas, kept on minocycline to cover Acinetobacter.  - Encouraged small frequent meals. Tolerance continues to improve, also having regular BMs - Stop Reglan today 2. 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  Cultures have grown acinetobacter, MRSA and pseudomonas He was readmitted with driveline infection in 2/24, completed course of daptomycin for MRSA.  Discharged from the hospital on 09/04/2023 with recurrent driveline infection growing Pseudomonas.   On chronic minocycline and ciprofloxacin at home as above.  Wound culture 10/1 grew MRSA. Reports compliance with home  regimen. Driveline appeared stable on CT A/P this admit. Suspect persistent indolent driveline infection. Afebrile, WBCs 8.8 yesterday.   - per ID, as he improves and gets ready for discharge would switch him back over to his chronic ciprofloxacin and minocycline  - will treat with 7 days IV abx (cefepime/daptomycin) to 10/22 then switch back to home regimen - CBC today.  2.  Chronic systolic CHF s/p Heartmate 3 LVAD on 11/17/20. Echo this admit  LVAD cannula in apex. Interventricular septum is midline. LVEF 20-25%, RV moderately reduced.  Not volume overloaded. Received IVF on admit due to poor po intake with ileus. MAP elevated. INR goal 2 - 2.5.  - Continue amlodipine 10 mg daily - Increase losartan 25>home 50 mg daily.  - Continue spiro 12.5 mg daily - Continue metoprolol xl 25 mg daily - Needs INR today, continue warfarin.  3. 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 Scr 0.8>1.46>>1.28 -> stable. AKI in setting of infection, elevated lactic acid and suspected volume depletion. Improved with IVF.  - BMET today.  4.  CAD S/p CABG 12/20 - Continue Crestor 20 mg daily.  5.  Paroxysmal atrial fibrillation status post maze and left atrial appendage clip. NSR this admission. - Off amiodarone with hyperthyroidism. 6. Type 2 diabetes -SSI 7. Hx methamphetamine abuse:  - UDS negative 8. 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  Followed by endocrinology. He is now off amiodarone and methimazole.  9. Anemia (chronic blood loss):  Hgb has been stable - CBC today.  10. COPD: Prior smoker.  - On trelegy at home 11. Ventral hernia: Large ventral hernia, soft and nontender.  Does not appear incarcerated.  No bowel obstruction on 09/23/23 abdominal CT.  Not candidate for surgery due to proximity of hernia to driveline tunnel.   Plan to complete IV abx today. Plan for d/c tomorrow.   I reviewed the LVAD parameters from today, and compared the  results to the patient's prior recorded data.  No programming changes were made.  The LVAD is functioning within specified parameters.  The patient performs LVAD self-test daily.  LVAD interrogation was negative for any significant power changes, alarms or PI events/speed drops.  LVAD equipment check completed and is in good working order.  Back-up equipment present.   LVAD education done on emergency procedures and precautions and reviewed exit site care.  Length of Stay: 7  Alen Bleacher, NP 09/30/2023, 8:00 AM  VAD Team --- VAD ISSUES ONLY--- Pager [phone] (7am - 7am)  Advanced Heart Failure Team  Pager [phone] (M-F; 7a - 5p)  Please contact [facility] for night-coverage after hours (5p -7a ) and weekends on [email or web address]

## 2023-09-30 NOTE — Progress Notes (Signed)
PT Cancellation Note/DISCHARGE  Patient Details Name: Kevin Underwood. MRN: [id number] DOB: [birth year]   Cancelled Treatment:    Reason Eval/Treat Not Completed: Other (comment). This PT entered patient's room and before this PT could introduce self pt said "No I'm not doing anything. You have all been in my room every day now get the hell out." When this PT attempted to educate pt on the desire for his medical team to work with [place] pt stated "I don't give a shit. Get the hell out, go see someone else." This PT stated she would tell his MD that he doesn't desire to work the physical therapy and pt said "Good, go tell him he just left here." Message sent to Dr. Shirlee Latch letting him know patient does not want therapy services and we are discharging him but if things change and we are needed in the future to please re-consult [place] again.  Lewis Shock, PT, DPT Acute Rehabilitation Services Secure chat preferred Office #: [phone]    Iona Hansen 09/30/2023, 2:08 PM

## 2023-10-01 ENCOUNTER — Other Ambulatory Visit (HOSPITAL_COMMUNITY): Payer: Self-pay

## 2023-10-01 DIAGNOSIS — I5022 Chronic systolic (congestive) heart failure: Secondary | ICD-10-CM | POA: Diagnosis not present

## 2023-10-01 DIAGNOSIS — T827XXA Infection and inflammatory reaction due to other cardiac and vascular devices, implants and grafts, initial encounter: Secondary | ICD-10-CM | POA: Diagnosis not present

## 2023-10-01 DIAGNOSIS — N179 Acute kidney failure, unspecified: Secondary | ICD-10-CM | POA: Diagnosis not present

## 2023-10-01 LAB — BASIC METABOLIC PANEL
Anion gap: 10 (ref 5–15)
BUN: 18 mg/dL (ref 8–23)
CO2: 23 mmol/L (ref 22–32)
Calcium: 8.8 mg/dL — ABNORMAL LOW (ref 8.9–10.3)
Chloride: 98 mmol/L (ref 98–111)
Creatinine, Ser: 0.83 mg/dL (ref 0.61–1.24)
GFR, Estimated: >60 mL/min (ref >60)
Glucose, Bld: 218 mg/dL — ABNORMAL HIGH (ref 70–99)
Potassium: 4.2 mmol/L (ref 3.5–5.1)
Sodium: 131 mmol/L — ABNORMAL LOW (ref 135–145)

## 2023-10-01 LAB — CBC
HCT: 33.7 % — ABNORMAL LOW (ref 39.0–52.0)
Hemoglobin: 11 g/dL — ABNORMAL LOW (ref 13.0–17.0)
MCH: 25.5 pg — ABNORMAL LOW (ref 26.0–34.0)
MCHC: 32.6 g/dL (ref 30.0–36.0)
MCV: 78.2 fL — ABNORMAL LOW (ref 80.0–100.0)
Platelets: 233 10*3/uL (ref 150–400)
RBC: 4.31 MIL/uL (ref 4.22–5.81)
RDW: 14.5 % (ref 11.5–15.5)
WBC: 9.1 10*3/uL (ref 4.0–10.5)
nRBC: 0 % (ref 0.0–0.2)

## 2023-10-01 LAB — GLUCOSE, CAPILLARY
Glucose-Capillary: 182 mg/dL — ABNORMAL HIGH (ref 70–99)
Glucose-Capillary: 204 mg/dL — ABNORMAL HIGH (ref 70–99)
Glucose-Capillary: 153 mg/dL — ABNORMAL HIGH (ref 70–99)

## 2023-10-01 LAB — LACTATE DEHYDROGENASE: LDH: 150 U/L (ref 98–192)

## 2023-10-01 LAB — PROTIME-INR
INR: 1.7 — ABNORMAL HIGH (ref 0.8–1.2)
Prothrombin Time: 20.3 s — ABNORMAL HIGH (ref 11.4–15.2)

## 2023-10-01 MED ORDER — LIDOCAINE 5 % EX PTCH
1.0000 | MEDICATED_PATCH | CUTANEOUS | Status: DC
  Administered 2023-10-01: 1 via TRANSDERMAL
  Filled 2023-10-01: qty 1

## 2023-10-01 MED ORDER — OXYCODONE HCL 5 MG PO TABS
10.0000 mg | ORAL_TABLET | Freq: Four times a day (QID) | ORAL | Status: DC | PRN
  Administered 2023-10-01: 10 mg via ORAL
  Filled 2023-10-01: qty 2

## 2023-10-01 MED ORDER — GABAPENTIN 400 MG PO CAPS
400.0000 mg | ORAL_CAPSULE | Freq: Three times a day (TID) | ORAL | Status: DC
  Administered 2023-10-01 – 2023-10-02 (×3): 400 mg via ORAL
  Filled 2023-10-01 (×3): qty 1

## 2023-10-01 MED ORDER — HYDROMORPHONE HCL 2 MG PO TABS
2.0000 mg | ORAL_TABLET | Freq: Four times a day (QID) | ORAL | Status: DC | PRN
  Administered 2023-10-01 – 2023-10-02 (×4): 2 mg via ORAL
  Filled 2023-10-01 (×4): qty 1

## 2023-10-01 MED ORDER — SPIRONOLACTONE 25 MG PO TABS
25.0000 mg | ORAL_TABLET | Freq: Every day | ORAL | Status: DC
  Administered 2023-10-01 – 2023-10-02 (×2): 25 mg via ORAL
  Filled 2023-10-01 (×2): qty 1

## 2023-10-01 MED ORDER — HYDROMORPHONE HCL ER 8 MG PO TB24
8.0000 mg | ORAL_TABLET | ORAL | Status: DC
  Administered 2023-10-01: 8 mg via ORAL
  Filled 2023-10-01: qty 1

## 2023-10-01 MED ORDER — ADULT MULTIVITAMIN W/MINERALS CH
1.0000 | ORAL_TABLET | Freq: Every day | ORAL | Status: DC
  Administered 2023-10-01 – 2023-10-02 (×2): 1
  Filled 2023-10-01 (×2): qty 1

## 2023-10-01 MED ORDER — WARFARIN SODIUM 4 MG PO TABS: 8.0000 mg | ORAL_TABLET | Freq: Once | ORAL | Status: DC

## 2023-10-01 MED ORDER — OXYCODONE HCL 5 MG PO TABS
10.0000 mg | ORAL_TABLET | ORAL | Status: DC | PRN
  Administered 2023-10-01: 10 mg via ORAL
  Filled 2023-10-01: qty 2

## 2023-10-01 MED ORDER — ACETAMINOPHEN 325 MG PO TABS
650.0000 mg | ORAL_TABLET | Freq: Four times a day (QID) | ORAL | Status: DC | PRN
Start: 2023-10-01 — End: 2023-10-02
  Administered 2023-10-01 (×3): 650 mg via ORAL
  Filled 2023-10-01 (×3): qty 2

## 2023-10-01 MED ORDER — ENSURE ENLIVE PO LIQD
237.0000 mL | Freq: Three times a day (TID) | ORAL | Status: DC
  Administered 2023-10-01 – 2023-10-02 (×3): 237 mL via ORAL

## 2023-10-01 MED ORDER — WARFARIN SODIUM 3 MG PO TABS
6.0000 mg | ORAL_TABLET | Freq: Once | ORAL | Status: AC
  Administered 2023-10-01: 6 mg via ORAL
  Filled 2023-10-01: qty 2

## 2023-10-01 NOTE — Progress Notes (Addendum)
PHARMACY - ANTICOAGULATION CONSULT NOTE  Pharmacy Consult for warfarin Indication:  HM3 LVAD  Allergies  Allergen Reactions   Firvanq [Vancomycin] Nausea And Vomiting and Other (See Comments)    Arthralgias     Patient Measurements: Height: 5\' 10"  (177.8 cm) Weight: 85.8 kg (189 lb 2.5 oz) IBW/kg (Calculated) : 73  Vital Signs: Temp: 97.9 F (36.6 C) (10/23 0748) Temp Source: Oral (10/23 0748) BP: 95/82 (10/23 0748)  Labs: Recent Labs    09/29/23 0929 09/30/23 0949 10/01/23 0620  HGB 10.8* 11.3* 11.0*  HCT 32.9* 34.5* 33.7*  PLT 216 266 233  LABPROT 25.8* 22.9* 20.3*  INR 2.3* 2.0* 1.7*  CREATININE 0.96 0.76 0.83    Estimated Creatinine Clearance: 95.3 mL/min (by C-G formula based on SCr of 0.83 mg/dL).   Medical History: Past Medical History:  Diagnosis Date   "    Arthritis    CAD (coronary artery disease)    a. s/p CABG in 11/2019 with LIMA-LAD, SVG-OM1, SVG-PDA and SVG-D1   CHF (congestive heart failure) (HCC)    a. EF < 20% by echo in 11/2019   Essential hypertension    PAF (paroxysmal atrial fibrillation) (HCC)    Type 2 diabetes mellitus (HCC)     Medications:  Medications Prior to Admission  Medication Sig Dispense Refill Last Dose   acetaminophen (TYLENOL) 325 MG tablet Take 2 tablets (650 mg total) by mouth every 4 (four) hours as needed for headache or mild pain.   09/22/2023   albuterol (PROVENTIL) (2.5 MG/3ML) 0.083% nebulizer solution INHALE 3 ML BY NEBULIZATION EVERY 6 HOURS AS NEEDED FOR WHEEZING OR SHORTNESS OF BREATH (Patient taking differently: Take 2.5 mg by nebulization every 6 (six) hours as needed for shortness of breath.) 450 mL 1 09/22/2023   albuterol (VENTOLIN HFA) 108 (90 Base) MCG/ACT inhaler Inhale 2 puffs into the lungs every 6 (six) hours as needed for wheezing or shortness of breath.   09/22/2023   allopurinol (ZYLOPRIM) 100 MG tablet Take 1 tablet (100 mg total) by mouth daily. 90 tablet 3 09/22/2023   amLODipine (NORVASC)  10 MG tablet Take 1 tablet (10 mg total) by mouth daily. 30 tablet 5 09/22/2023   ciprofloxacin (CIPRO) 750 MG tablet Take 1 tablet (750 mg total) by mouth 2 (two) times daily. 60 tablet 3 09/22/2023   colchicine 0.6 MG tablet Take 1 tablet (0.6 mg total) by mouth daily as needed (gout pain). (Patient taking differently: Take 0.6 mg by mouth daily.)   09/22/2023   COMBIVENT RESPIMAT 20-100 MCG/ACT AERS respimat Inhale 1 puff into the lungs every 6 (six) hours.   09/22/2023   fluticasone (FLONASE ALLERGY RELIEF) 50 MCG/ACT nasal spray Place 2 sprays into both nostrils as needed for allergies or rhinitis.   09/22/2023   gabapentin (NEURONTIN) 300 MG capsule Take 1 capsule (300 mg total) by mouth 3 (three) times daily. 90 capsule 0 09/22/2023   glipiZIDE (GLUCOTROL) 5 MG tablet Take 1 tablet (5 mg total) by mouth daily. 30 tablet 11 09/22/2023   guaifenesin (HUMIBID E) 400 MG TABS tablet Take 400 mg by mouth every 6 (six) hours as needed (for congestion).   09/22/2023   loratadine (CLARITIN) 10 MG tablet Take 1 tablet (10 mg total) by mouth daily. 30 tablet 6 09/22/2023   losartan (COZAAR) 50 MG tablet Take 1 tablet (50 mg total) by mouth daily. 30 tablet 5 09/22/2023   Menthol, Topical Analgesic, (BIOFREEZE) 10 % CREA Apply 1 Application topically 2 (two) times  daily as needed (Apply to all joints for pain).   09/22/2023   metFORMIN (GLUCOPHAGE) 1000 MG tablet Take 1 tablet (1,000 mg total) by mouth 2 (two) times daily. 120 tablet 6 09/22/2023   metoprolol succinate (TOPROL XL) 25 MG 24 hr tablet Take 1 tablet (25 mg total) by mouth daily. 30 tablet 5 09/22/2023   minocycline (MINOCIN) 100 MG capsule Take 2 capsules (200 mg total) by mouth 2 (two) times daily. 120 capsule 3 09/22/2023   mirtazapine (REMERON) 15 MG tablet Take 1 tablet (15 mg total) by mouth at bedtime.   Past Week   Multiple Vitamin (MULTIVITAMIN WITH MINERALS) TABS tablet Take 1 tablet by mouth daily.   09/22/2023   Oxycodone HCl 10  MG TABS Take 1 tablet (10 mg total) by mouth every 6 (six) hours as needed. 28 tablet 0 Past Month   pantoprazole (PROTONIX) 40 MG tablet Take 1 tablet (40 mg total) by mouth daily. 90 tablet 2 09/22/2023   rosuvastatin (CRESTOR) 20 MG tablet Take 1 tablet (20 mg total) by mouth daily. 30 tablet 5 09/22/2023   sertraline (ZOLOFT) 50 MG tablet Take 1 tablet (50 mg total) by mouth daily. (Patient taking differently: Take 50 mg by mouth at bedtime.) 90 tablet 3 Past Week   spironolactone (ALDACTONE) 25 MG tablet Take 0.5 tablets (12.5 mg total) by mouth daily. (Patient taking differently: Take 25 mg by mouth daily.) 30 tablet 5 09/22/2023   tamsulosin (FLOMAX) 0.4 MG CAPS capsule Take 1 capsule (0.4 mg total) by mouth daily. 90 capsule 3 09/22/2023   thiamine (VITAMIN B-1) 100 MG tablet Take 1 tablet (100 mg total) by mouth daily.   09/22/2023   traMADol (ULTRAM) 50 MG tablet Take 2 tablets (100 mg total) by mouth every 12 (twelve) hours as needed. 120 tablet 0 Past Week   traZODone (DESYREL) 150 MG tablet Take 1 tablet (150 mg total) by mouth at bedtime. 90 tablet 3 Past Month   TRELEGY ELLIPTA 100-62.5-25 MCG/ACT AEPB Inhale 1 puff into the lungs daily. 1 each 6 09/22/2023   warfarin (COUMADIN) 4 MG tablet Take 1-1.5 tablets (4-6 mg total) by mouth See admin instructions. Take 1 tablet (4 mg) every Tuesday and Thursday, Take 1.5 tabs (6 mg) all other days 45 tablet 11 09/22/2023 at 0800   Scheduled:   allopurinol  100 mg Oral Daily   amLODipine  10 mg Oral Daily   ciprofloxacin  750 mg Oral BID   gabapentin  300 mg Oral TID   insulin aspart  0-15 Units Subcutaneous TID WC   insulin glargine-yfgn  12 Units Subcutaneous Daily   losartan  50 mg Oral Daily   metoprolol succinate  25 mg Oral Daily   minocycline  200 mg Oral BID   mirtazapine  15 mg Oral QHS   polyethylene glycol  17 g Oral BID   rosuvastatin  20 mg Oral Daily   senna-docusate  2 tablet Oral QHS   sertraline  50 mg Oral Daily    spironolactone  25 mg Oral Daily   tamsulosin  0.4 mg Oral Daily   Warfarin - Pharmacist Dosing Inpatient   Does not apply q1600    Assessment: Kevin Underwood is a 62YOM who was admitted for abdominal pain and pain control. Patient has a HM3 LVAD and on warfarin PTA.  Warfarin PTA regimen: 4 mg T/Th and 6 mg EOD Last dose PTA: 10/14 AM  Notable DDI: Ciprofloxacin resumed on 10/23  INR today is  subtherapeutic at 1.7. Hgb 11.0, plt 233. Question if INR elevation on admission related to low oral intake with abdominal pain -  eating better today. Noted normal diet typically consists of high amounts of greens at home when eating well. While supra-therapeutic on admission, has been subtherapeutic on outpatient INR checks previously.   Goal of Therapy:  INR 2.0-2.5 Monitor platelets by anticoagulation protocol: Yes   Plan:  Warfarin 6 mg x1 tonight Monitor INR, CBC, and bleeding daily  Thank you for allowing pharmacy to participate in this patient's care,  Enos Fling, PharmD PGY-1 Acute Care Pharmacy Resident 10/01/2023 8:09 AM  Please check AMION for all [facility] Pharmacy phone numbers After 10:00 PM, call Main Pharmacy [phone]

## 2023-10-01 NOTE — TOC Initial Note (Addendum)
Transition of Care (TOC) - Initial/Assessment Note    Patient Details  Name: Kevin Underwood. MRN: [id number] Date of Birth: [birth year]  Transition of Care [facility]) CM/SW Contact:    [name], RN Phone Number: [phone] 10/01/2023, 5:11 PM  Clinical Narrative:  CM spoke to pt and has scale at home for daily weights. Pt declines HH, states he need someone to help with light housekeeping in the home. Explained CM can complete application for [place]. Pt declined.                Wife will provide transportation home.   Expected Discharge Plan: Home/Self Care Barriers to Discharge: Continued Medical Work up   Patient Goals and CMS Choice Patient states their goals for this hospitalization and ongoing recovery are:: wants to remain independent CMS Medicare.gov Compare Post Acute Care list provided to:: Patient Choice offered to / list presented to : Patient      Expected Discharge Plan and Services   Discharge Planning Services: CM Consult Post Acute Care Choice: Home Health Living arrangements for the past 2 months: Single Family Home                           HH Arranged: Refused HH          Prior Living Arrangements/Services Living arrangements for the past 2 months: Single Family Home Lives with:: Spouse Patient language and need for interpreter reviewed:: Yes Do you feel safe going back to the place where you live?: Yes      Need for Family Participation in Patient Care: No (Comment) Care giver support system in place?: Yes (comment) Current home services: DME (LVAD, scale, nebulizer) Criminal Activity/Legal Involvement Pertinent to Current Situation/Hospitalization: No - Comment as needed  Activities of Daily Living   ADL Screening (condition at time of admission) Independently performs ADLs?: No Does the patient have a NEW difficulty with bathing/dressing/toileting/self-feeding that is expected to last >3 days?: Yes (Initiates  electronic notice to provider for possible OT consult) (weak) Does the patient have a NEW difficulty with getting in/out of bed, walking, or climbing stairs that is expected to last >3 days?: Yes (Initiates electronic notice to provider for possible PT consult) (weak) Does the patient have a NEW difficulty with communication that is expected to last >3 days?: No Is the patient deaf or have difficulty hearing?: No Does the patient have difficulty seeing, even when wearing glasses/contacts?: No Does the patient have difficulty concentrating, remembering, or making decisions?: Yes  Permission Sought/Granted Permission sought to share information with : Case Manager, Family Supports, PCP Permission granted to share information with : Yes, Verbal Permission Granted  Share Information with NAME: Ichael Mccarter  Permission granted to share info w AGENCY: Home Health  Permission granted to share info w Relationship: wife  Permission granted to share info w Contact Information: [phone]  Emotional Assessment Appearance:: Appears stated age Attitude/Demeanor/Rapport: Engaged Affect (typically observed): Accepting Orientation: : Oriented to Self, Oriented to Place, Oriented to  Time, Oriented to Situation   Psych Involvement: No (comment)  Admission diagnosis:  Lactic acidosis [E87.20] Generalized abdominal pain [R10.84] Abdominal wall hernia [K43.9] LVAD (left ventricular assist device) present (HCC) [Z95.811] Abdominal pain [R10.9] Patient Active Problem List   Diagnosis Date Noted   Abdominal pain 09/23/2023   Sepsis (HCC) 09/08/2023   Stimulant use disorder 02/09/2023   Depression 02/09/2023   Pain and swelling of right wrist  09/30/2022   Hypotension 08/07/2022   MRSA infection    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 (LVAD) (HCC)    Left ventricular assist device (LVAD) complication, subsequent encounter 05/13/2022   AKI (acute kidney injury) (HCC)  05/13/2022   Hyperthyroidism 01/22/2022   Chronic systolic heart failure (HCC)    COPD (chronic obstructive pulmonary disease) (HCC) 09/09/2021   Malnutrition of moderate degree 11/17/2020   CHF (congestive heart failure), NYHA class IV, chronic, systolic (HCC) 11/17/2020   Palliative care by specialist    Goals of care, counseling/discussion    Hyperlipidemia    CHF exacerbation (HCC) 09/23/2020   Unstable angina (HCC) 09/22/2020   LVAD (left ventricular assist device) present (HCC)    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   Cardiogenic shock (HCC)    Acute respiratory failure (HCC)    RUQ abdominal pain    Cardiomyopathy (HCC) 11/21/2019   Acute systolic CHF (congestive heart failure) (HCC) 11/21/2019   History of alcohol abuse Quit 6 mos ago 11/21/2019   History of recreational drug use 11/21/2019   PAF (paroxysmal atrial fibrillation) (HCC) 11/21/2019   Cardiac volume overload 11/21/2019   Elevated brain natriuretic peptide (BNP) level 11/21/2019   Leg edema, right 11/21/2019   Hyponatremia 11/21/2019   Abnormal electrocardiogram (ECG) (EKG) 11/21/2019   Hyperglycemia 11/21/2019   Tremulousness 11/21/2019   Restlessness and agitation 11/21/2019   OSA (obstructive sleep apnea) 11/21/2019   Atrial fibrillation with RVR (HCC) 11/20/2019   Acute congestive heart failure (HCC) 11/20/2019   Acute respiratory distress 11/20/2019   COPD with acute exacerbation (HCC)    Hypertension    Type 2 diabetes mellitus (HCC)    Closed fracture of 5th metacarpal 08/31/2012   PCP:  [name], FNP Pharmacy:   [phone] - [place], [place] - [address] AT [facility] [address] [place] [address] Phone: [phone] Fax: [phone]     Social Determinants of Health (SDOH) Social History: SDOH Screenings   Food Insecurity: No Food Insecurity (09/23/2023)  Housing: Low Risk  (09/23/2023)  Transportation Needs: No Transportation Needs (09/23/2023)   Recent Concern: Transportation Needs - Unmet Transportation Needs (09/08/2023)  Utilities: Not At Risk (09/23/2023)  Alcohol Screen: Low Risk  (11/10/2020)  Depression (PHQ2-9): Low Risk  (10/16/2022)  Financial Resource Strain: Medium Risk (05/17/2022)  Physical Activity: Inactive (11/10/2020)  Tobacco Use: High Risk (09/23/2023)   SDOH Interventions:     Readmission Risk Interventions    07/01/2022    9:05 AM  Readmission Risk Prevention Plan  Transportation Screening Complete  Medication Review (RN Care Manager) Complete  PCP or Specialist appointment within 3-5 days of discharge Complete  HRI or Home Care Consult Complete  SW Recovery Care/Counseling Consult Complete  Palliative Care Screening Not Applicable  Skilled Nursing Facility Not Applicable

## 2023-10-01 NOTE — TOC Benefit Eligibility Note (Signed)
Patient Product/process development scientist completed.    The patient is insured through [facility]. Patient has Medicare and is not eligible for a copay card, but may be able to apply for patient assistance, if available.    Ran test claim for hydroorphone (Exalgo) 8 mg tb 24 and the current 30 day co-pay is $0.00.   This test claim was processed through [facility]- copay amounts may vary at other pharmacies due to pharmacy/plan contracts, or as the patient moves through the different stages of their insurance plan.     Roland Earl, CPHT Pharmacy Technician III Certified Patient Advocate [facility] Pharm[facility] Patient Advocate Team Direct Number: [phone]  Fax: [phone]

## 2023-10-01 NOTE — Progress Notes (Signed)
LVAD Coordinator Rounding Note:  Admitted 09/23/23 to Heart Failure service from [facility] with worsening abdominal pain and nausea x 2-3 days.   CT abd/pelvis 09/23/23 1. Mild fluid distension of left upper quadrant jejunal bowel loops without discrete transition point suggestive of mild ileus or enteritis. 2. Otherwise no CT evidence for acute intra-abdominal or pelvic abnormality. LVAD with similar skin thickening and circumferential soft tissue thickening about the drive line in the upper abdomen without development of new fluid around the drive line since the prior exam. 3. 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 Aortic atherosclerosis.  Pt lying in bed on my arrival complaining of ongoing pain at hernia site. Pt requesting for VAD Coordinator to arrange appointment at pain clinic for him upon discharge. Awaiting response from [facility].  Echo completed 10/17. EF 20-25 % RV moderately reduced.   WBC 9.1 today. 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 Blood cxs 10/15 NGTD. ID team following. Currently on IV Daptomycin 700 mg daily, IV Cefepime 2g q 8 hrs, and PO Minocycline.    Vital signs: Temp: 97.9 HR: 95 Doppler Pressure: 105 Automatic BP: 95/82 (88) O2 Sat: 98% on RA Wt: 185.6>186.7>186.2>188.7>189.2 lbs  LVAD interrogation reveals:  Speed: 5600 Flow: 4.3 Power: 4.2 w PI: 3.5  Alarms: none Events: >100 PI events today Hct: 32  Fixed speed:  5600 Low speed limit:  5300  Drive Line: Existing VAD dressing removed and site care performed using sterile technique by Dr. Maren Beach. Wound bed cleansed with VASHE solution. Skin surrounding wound bed cleaned with Chlora prep applicators x 2, allowed to dry. Followed by VASHE soaked 2x2 flush to exit site and then covered with several dry 4 x 4s. Drive line partially incorporated. Small amount of  thick yellow/serosanguinous drainage noted. No redness, tenderness, foul odor or rash noted. Ulcerated area distal to drive line cleansed with VASHE solution. VASHE moistened 2 x 2 placed over area and covered with gauze. 2 brown rectangular mepitel dressings applied over reddened skin/skin tears surrounding dressing prior to covering entire area with 3 large tegaderms. Drive line anchor reapplied on top of mepitel dressing to prevent skin breakdown. Continue daily wet to dry dressing changes using VASHE solution per bedside RN or VAD coordinator. Next dressing change due 10/02/23.     Pt had previous drive line debridement in OR with placement of instillation wound vac on 07/09/23 with Dr Donata Clay. This was changed to wet/dry on Friday 8/2. No further debridement of the VAD tunnel would be possible without injury to the outflow graft superiorly or the peritoneal cavity inferior to the power cord per Dr Donata Clay.   Labs:  LDH trend: 149>200>150  INR trend: 3.2>2.9>2.3>1.7  WBC trend: 16>26>10.2>9.1>11.6>9.1  Lactic acid trend: 3.6>2.6>3.3>2.7>2.8>2.0>1.5  Anticoagulation Plan: -INR Goal: 2.0 - 2.5 -ASA Dose: none  ICD: N/A  Infection:  09/23/23>>resp panel>>negative 09/23/23>> blood cxs>> no growth 3 days; final pending 09/24/23>>urine cx>> no growth; final  Drips:   Plan/Recommendations:  Page VAD coordinator with equipment issues or driveline problems Daily drive line wet to dry dressing changes with VASHE solution per bedside RN.   Simmie Davies RN,BSN VAD Coordinator  Office: [phone]  24/7 Pager: [phone]

## 2023-10-01 NOTE — Progress Notes (Signed)
Patient ID: Kevin Rigg., male   DOB: [birth year], 62 y.o.   MRN: [id number]   Advanced Heart Failure VAD Team Note  PCP-Cardiologist: Nona Dell, MD   Subjective:   CT A/P 10/15: Possible mild ileus or enteritis, no evidence of ischemic bowel, obstruction, or VAD tunnel abscess  CT of Neck (neck pain) did not show any acute process.  Completed IV antibiotics. Now on oral antibiotics.   Denies SOB. Wants to go home.  Getting IV dilaudid 4 times a day.    LVAD INTERROGATION:  HeartMate III LVAD:   Flow 3.8  liters/min, speed 5600, power 4 PI 5.3   >100 PI events VAD interrogated personally  Objective:    Vital Signs:   Temp:  [97.7 F (36.5 C)-98.2 F (36.8 C)] 97.9 F (36.6 C) (10/23 0543) Pulse Rate:  [85-95] 85 (10/22 1930) Resp:  [13-16] 16 (10/23 0545) BP: (65-123)/(49-108) 112/97 (10/23 0545) SpO2:  [97 %-100 %] 98 % (10/23 0543) Weight:  [85.8 kg] 85.8 kg (10/23 0550) Last BM Date : 09/29/23 Mean arterial Pressure Low 100s   Intake/Output:   Intake/Output Summary (Last 24 hours) at 10/01/2023 0734 Last data filed at 10/01/2023 0543 Gross per 24 hour  Intake 2900 ml  Output 2300 ml  Net 600 ml     Physical Exam  Physical Exam: GENERAL: No acute distress. HEENT: normal  NECK: Supple, JVP flat   .  2+ bilaterally, no bruits.  No lymphadenopathy or thyromegaly appreciated.   CARDIAC:  Mechanical heart sounds with LVAD hum present.  LUNGS:  Clear to auscultation bilaterally.  ABDOMEN:  large hernia.  round, nontender, positive bowel sounds x4.     LVAD exit site:   Dressing dry and intact.  No erythema or drainage.   EXTREMITIES:  Warm and dry, no cyanosis, clubbing, rash or edema  NEUROLOGIC:  Alert and oriented x 3.    No aphasia.  No dysarthria.  Affect pleasant.       Telemetry   ST 100s   Labs   Basic Metabolic Panel: Recent Labs  Lab 09/27/23 0847 09/28/23 1016 09/29/23 0929 09/30/23 0949 10/01/23 0620  NA 132* 133* 131* 133*  131*  K 4.1 4.0 4.5 4.2 4.2  CL 101 98 101 96* 98  CO2 23 23 25 24 23   GLUCOSE 179* 205* 256* 172* 218*  BUN 22 19 20 18 18   CREATININE 0.97 0.99 0.96 0.76 0.83  CALCIUM 8.1* 9.1 7.8* 8.9 8.8*    Liver Function Tests: Recent Labs  Lab 09/24/23 1639  AST 25  ALT 24  ALKPHOS 125  BILITOT 0.4  PROT 6.6  ALBUMIN 2.8*   No results for input(s): "LIPASE", "AMYLASE" in the last 168 hours.  No results for input(s): "AMMONIA" in the last 168 hours.  CBC: Recent Labs  Lab 09/27/23 0847 09/28/23 1016 09/29/23 0929 09/30/23 0949 10/01/23 0620  WBC 9.0 8.5 8.8 11.6* 9.1  HGB 12.3* 13.8 10.8* 11.3* 11.0*  HCT 37.6* 42.4 32.9* 34.5* 33.7*  MCV 77.8* 77.7* 78.1* 78.8* 78.2*  PLT 243 258 216 266 233    INR: Recent Labs  Lab 09/27/23 0847 09/28/23 1447 09/29/23 0929 09/30/23 0949 10/01/23 0620  INR 2.4* 2.5* 2.3* 2.0* 1.7*    Other results: EKG:    Imaging   No results found.   Medications:     Scheduled Medications:  allopurinol  100 mg Oral Daily   amLODipine  10 mg Oral Daily   ciprofloxacin  750  mg Oral BID   gabapentin  300 mg Oral TID   insulin aspart  0-15 Units Subcutaneous TID WC   insulin glargine-yfgn  12 Units Subcutaneous Daily   losartan  50 mg Oral Daily   metoprolol succinate  25 mg Oral Daily   minocycline  200 mg Oral BID   mirtazapine  15 mg Oral QHS   polyethylene glycol  17 g Oral BID   rosuvastatin  20 mg Oral Daily   senna-docusate  2 tablet Oral QHS   sertraline  50 mg Oral Daily   spironolactone  12.5 mg Oral Daily   tamsulosin  0.4 mg Oral Daily   Warfarin - Pharmacist Dosing Inpatient   Does not apply q1600    Infusions:    PRN Medications: hydrALAZINE, HYDROmorphone (DILAUDID) injection, ondansetron (ZOFRAN) IV   Patient Profile   62 y.o. male with history of CAD s/p CABG, chronic systolic CHF s/p HM III VAD, chronic driveline infection, DM II, COPD, HTN, large abdominal hernia.  Presenting with severe abdominal  pain X 2-3 days. Admitted for further workup and pain control.   Assessment/Plan:   1. Abdominal pain with n/v:  Associated with nausea and vomiting. CT A/P 10/15 showed possible mild ileus or enteritis, no evidence of ischemic bowel, obstruction, or VAD tunnel abscess.  Lipase and LFTs negative. Lactic acid elevated at 3.6 on admit but cleared down to 1.5. WBCs down w/ IV abx, 26>>10k-> 9k. Urine culture and BC NGTD.  Has been seen by TCTS and ID.  Suspect presentation is due to ileus + acute on chronic driveline infection. - Cipro switched to daptomycin and cefepime due to poor po intake to cover MRSA and Pseudomonas, kept on minocycline to cover Acinetobacter.  -Intolerant abdominal binder. Had BM today. No pain.  2. 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  Cultures have grown acinetobacter, MRSA and pseudomonas He was readmitted with driveline infection in 2/24, completed course of daptomycin for MRSA.  Discharged from the hospital on 09/04/2023 with recurrent driveline infection growing Pseudomonas.   On chronic minocycline and ciprofloxacin at home as above.  Wound culture 10/1 grew MRSA. Reports compliance with home regimen. Driveline appeared stable on CT A/P this admit. Suspect persistent indolent driveline infection.  - per ID,  chronic ciprofloxacin and minocycline at discharged.  - Completed 7 days IV abx (cefepime/daptomycin)  - Stop IV dilaudid. Place back on oxycodone. Needs to ensure pain controlled.  2.  Chronic systolic CHF s/p Heartmate 3 LVAD on 11/17/20. Echo this admit LVAD cannula in apex. Interventricular septum is midline. LVEF 20-25%, RV moderately reduced.  Received IVF on admit due to poor po intake with ileus.  INR goal 2 - 2.5. INR 1.7 . Back on home regimen and appetite is improving.  -LDH stable.  - Continue amlodipine 10 mg daily - Continue losartan 50 mg daily  -  Increase spiro to 25 mg daily.  - Continue metoprolol xl 25 mg daily 3. 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  AKI in setting of infection, elevated lactic acid and suspected volume depletion. Improved with IVF.  - Resolved.  4.  CAD S/p CABG 12/20 - Continue Crestor 20 mg daily.  5.  Paroxysmal atrial fibrillation status post maze and left atrial appendage clip. NSR this admission. - Off amiodarone with hyperthyroidism. 6. Type 2 diabetes -SSI 7. Hx methamphetamine abuse:  -  UDS negative 8. 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  Followed by endocrinology. He is now off amiodarone and methimazole.  9. Anemia (chronic blood loss):   Hgb stable.  10. COPD: Prior smoker.  - On trelegy at home 11. Ventral hernia: Large ventral hernia, soft and nontender.  Does not appear incarcerated.  No bowel obstruction on 09/23/23 abdominal CT.  Not candidate for surgery due to proximity of hernia to driveline tunnel. Intolerant abdominal binder.   Stop IV dilaudid. Start oxycodone. 10 mg every 6 hours.   I reviewed the LVAD parameters from today, and compared the results to the patient's prior recorded data.  No programming changes were made.  The LVAD is functioning within specified parameters.  The patient performs LVAD self-test daily.  LVAD interrogation was negative for any significant power changes, alarms or PI events/speed drops.  LVAD equipment check completed and is in good working order.  Back-up equipment present.   LVAD education done on emergency procedures and precautions and reviewed exit site care.  Length of Stay: 8  Tonye Becket, NP 10/01/2023, 7:34 AM  VAD Team --- VAD ISSUES ONLY--- Pager [phone] (7am - 7am)  Advanced Heart Failure Team  Pager [phone] (M-F; 7a - 5p)  Please contact [facility] for night-coverage after hours (5p -7a ) and weekends on [email or web address]

## 2023-10-01 NOTE — Progress Notes (Signed)
   Called by staff nurse and informed Kevin Underwood was going to leave AMA because his pain was not controlled.   Upon entering the room Kevin Underwood started yelling and using profanity that he was in pain. He then started yelling at me and said that he was going to leave and never come back. I explained that I didn't think that was a good plan because if he leaves AMA we would not be able to give him medications.   I attempted to explain that his pain medication was changed from IV dilaudid to po pain medication so that we could have his pain controlled before he discharged to home and that I was reaching out to the Palliative Care Team to help with a regimen.    He continued to yell and scream. His wife entered the room and said that  this is what he does to her at home.   I have personally Dr Phillips Odor with Palliative Care for assistance with pain medication, substance abuse, and chronic pain.  Our VAD Coordinators are trying to refer to an Outpatient Pain Clinic. We discussed his dilaudid use over the last 3 days.   Dr Phillips Odor recommended using exalgo 8 mg daily, dilaudid 2 mg as need every 6 hours for break through pain, and lidocaine patch. Discussed with Dr Elwyn Lade and he was ok try the above recommendation.   I went back and discussed with Kevin Underwood and  he was willing to try it. Kevin Underwood was much calmer when I left the room.     Kevin Desroches NP-C  1:11 PM

## 2023-10-01 NOTE — Plan of Care (Signed)
  Problem: Education: Goal: Patient will understand all VAD equipment and how it functions Outcome: Progressing Goal: Patient will be able to verbalize current INR target range and antiplatelet therapy for discharge home Outcome: Progressing   Problem: Cardiac: Goal: LVAD will function as expected and patient will experience no clinical alarms Outcome: Progressing   Problem: Clinical Measurements: Goal: Respiratory complications will improve Outcome: Progressing Goal: Cardiovascular complication will be avoided Outcome: Progressing   Problem: Activity: Goal: Risk for activity intolerance will decrease Outcome: Progressing   Problem: Pain Managment: Goal: General experience of comfort will improve Outcome: Progressing   Problem: Safety: Goal: Ability to remain free from injury will improve Outcome: Progressing

## 2023-10-01 NOTE — Progress Notes (Signed)
OT Cancellation Note  Patient Details Name: Kevin Underwood. MRN: [id number] DOB: [birth year]   Cancelled Treatment:    Reason Eval/Treat Not Completed: Other (comment) (per RN pt agitated at this time, will follow up this PM for OT tx as schedule permits)  Carver Fila, OTD, OTR/L [facility] [phone]   Dalphine Handing 10/01/2023, 1:55 PM

## 2023-10-01 NOTE — Plan of Care (Signed)
  Problem: Education: Goal: Patient will understand all VAD equipment and how it functions Outcome: Progressing Goal: Patient will be able to verbalize current INR target range and antiplatelet therapy for discharge home Outcome: Progressing   Problem: Cardiac: Goal: LVAD will function as expected and patient will experience no clinical alarms Outcome: Progressing   Problem: Education: Goal: Knowledge of General Education information will improve Description: Including pain rating scale, medication(s)/side effects and non-pharmacologic comfort measures Outcome: Progressing   Problem: Health Behavior/Discharge Planning: Goal: Ability to manage health-related needs will improve Outcome: Progressing   Problem: Clinical Measurements: Goal: Ability to maintain clinical measurements within normal limits will improve Outcome: Progressing Goal: Will remain free from infection Outcome: Progressing Goal: Diagnostic test results will improve Outcome: Progressing Goal: Respiratory complications will improve Outcome: Progressing Goal: Cardiovascular complication will be avoided Outcome: Progressing   Problem: Activity: Goal: Risk for activity intolerance will decrease Outcome: Progressing   Problem: Nutrition: Goal: Adequate nutrition will be maintained Outcome: Progressing   Problem: Coping: Goal: Level of anxiety will decrease Outcome: Progressing   Problem: Elimination: Goal: Will not experience complications related to bowel motility Outcome: Progressing Goal: Will not experience complications related to urinary retention Outcome: Progressing   Problem: Pain Managment: Goal: General experience of comfort will improve Outcome: Progressing   Problem: Safety: Goal: Ability to remain free from injury will improve Outcome: Progressing   Problem: Skin Integrity: Goal: Risk for impaired skin integrity will decrease Outcome: Progressing   Problem: Education: Goal: Ability  to describe self-care measures that may prevent or decrease complications (Diabetes Survival Skills Education) will improve Outcome: Progressing Goal: Individualized Educational Video(s) Outcome: Progressing   Problem: Coping: Goal: Ability to adjust to condition or change in health will improve Outcome: Progressing   Problem: Fluid Volume: Goal: Ability to maintain a balanced intake and output will improve Outcome: Progressing   Problem: Health Behavior/Discharge Planning: Goal: Ability to identify and utilize available resources and services will improve Outcome: Progressing Goal: Ability to manage health-related needs will improve Outcome: Progressing   Problem: Metabolic: Goal: Ability to maintain appropriate glucose levels will improve Outcome: Progressing   Problem: Nutritional: Goal: Maintenance of adequate nutrition will improve Outcome: Progressing Goal: Progress toward achieving an optimal weight will improve Outcome: Progressing   Problem: Skin Integrity: Goal: Risk for impaired skin integrity will decrease Outcome: Progressing

## 2023-10-02 ENCOUNTER — Other Ambulatory Visit: Payer: Self-pay | Admitting: Cardiology

## 2023-10-02 ENCOUNTER — Other Ambulatory Visit (HOSPITAL_COMMUNITY): Payer: Self-pay | Admitting: Cardiology

## 2023-10-02 ENCOUNTER — Other Ambulatory Visit (HOSPITAL_COMMUNITY): Payer: Self-pay | Admitting: Adult Health

## 2023-10-02 ENCOUNTER — Telehealth (HOSPITAL_COMMUNITY): Payer: Self-pay

## 2023-10-02 DIAGNOSIS — I5022 Chronic systolic (congestive) heart failure: Secondary | ICD-10-CM | POA: Diagnosis not present

## 2023-10-02 DIAGNOSIS — N179 Acute kidney failure, unspecified: Secondary | ICD-10-CM | POA: Diagnosis not present

## 2023-10-02 DIAGNOSIS — E43 Unspecified severe protein-calorie malnutrition: Secondary | ICD-10-CM | POA: Insufficient documentation

## 2023-10-02 DIAGNOSIS — T827XXA Infection and inflammatory reaction due to other cardiac and vascular devices, implants and grafts, initial encounter: Secondary | ICD-10-CM | POA: Diagnosis not present

## 2023-10-02 LAB — GLUCOSE, CAPILLARY
Glucose-Capillary: 304 mg/dL — ABNORMAL HIGH (ref 70–99)
Glucose-Capillary: 137 mg/dL — ABNORMAL HIGH (ref 70–99)

## 2023-10-02 LAB — PROTIME-INR
INR: 1.9 — ABNORMAL HIGH (ref 0.8–1.2)
Prothrombin Time: 21.8 s — ABNORMAL HIGH (ref 11.4–15.2)

## 2023-10-02 MED ORDER — WARFARIN SODIUM 4 MG PO TABS
4.0000 mg | ORAL_TABLET | Freq: Once | ORAL | Status: AC
  Administered 2023-10-02: 4 mg via ORAL
  Filled 2023-10-02: qty 1

## 2023-10-02 MED ORDER — HYDROMORPHONE HCL ER 8 MG PO TB24: 8.0000 mg | ORAL_TABLET | ORAL | 0 refills | Status: DC

## 2023-10-02 MED ORDER — WARFARIN SODIUM 4 MG PO TABS: 4.0000 mg | ORAL_TABLET | ORAL | 11 refills | Status: DC

## 2023-10-02 MED ORDER — HYDROMORPHONE HCL ER 8 MG PO TB24
8.0000 mg | ORAL_TABLET | ORAL | Status: DC
  Administered 2023-10-02: 8 mg via ORAL
  Filled 2023-10-02: qty 1

## 2023-10-02 MED ORDER — HYDROMORPHONE HCL 2 MG PO TABS: 2.0000 mg | ORAL_TABLET | Freq: Three times a day (TID) | ORAL | 0 refills | Status: DC | PRN

## 2023-10-02 MED ORDER — SENNOSIDES-DOCUSATE SODIUM 8.6-50 MG PO TABS: 2.0000 | ORAL_TABLET | Freq: Every day | ORAL | 3 refills | Status: DC

## 2023-10-02 MED ORDER — SPIRONOLACTONE 25 MG PO TABS: 25.0000 mg | ORAL_TABLET | Freq: Every day | ORAL | 5 refills | Status: DC

## 2023-10-02 MED ORDER — LIDOCAINE 5 % EX PTCH: 1.0000 | MEDICATED_PATCH | CUTANEOUS | 0 refills | Status: DC

## 2023-10-02 NOTE — TOC Transition Note (Signed)
Transition of Care [facility]) - CM/SW Discharge Note   Patient Details  Name: Kevin Underwood. MRN: [id number] Date of Birth: [birth year]  Transition of Care [facility]) CM/SW Contact:  Nicanor Bake Phone Number: [phone] 10/02/2023, 11:40 AM   Clinical Narrative:   HF CSW met with pt and wife at bedside. Pt stated that he does not know when his pain management appointment is. CSW explained that the pts PCP  hospital follow up appointment will be printed on dc summary. Pts wife will be transporting home.       Barriers to Discharge: Continued Medical Work up   Patient Goals and CMS Choice CMS Medicare.gov Compare Post Acute Care list provided to:: Patient Choice offered to / list presented to : Patient  Discharge Placement                         Discharge Plan and Services Additional resources added to the After Visit Summary for     Discharge Planning Services: CM Consult Post Acute Care Choice: Home Health                    HH Arranged: Refused [facility]          Social Determinants of Health (SDOH) Interventions SDOH Screenings   Food Insecurity: No Food Insecurity (09/23/2023)  Housing: Low Risk  (09/23/2023)  Transportation Needs: No Transportation Needs (09/23/2023)  Recent Concern: Transportation Needs - Unmet Transportation Needs (09/08/2023)  Utilities: Not At Risk (09/23/2023)  Alcohol Screen: Low Risk  (11/10/2020)  Depression (PHQ2-9): Low Risk  (10/16/2022)  Financial Resource Strain: Medium Risk (05/17/2022)  Physical Activity: Inactive (11/10/2020)  Tobacco Use: High Risk (09/23/2023)     Readmission Risk Interventions    07/01/2022    9:05 AM  Readmission Risk Prevention Plan  Transportation Screening Complete  Medication Review (RN Care Manager) Complete  PCP or Specialist appointment within 3-5 days of discharge Complete  HRI or Home Care Consult Complete  SW Recovery Care/Counseling Consult Complete  Palliative Care Screening Not  Applicable  Skilled Nursing Facility Not Applicable

## 2023-10-02 NOTE — Progress Notes (Addendum)
Patient ID: Kevin Rigg., male   DOB: Sep 18, 1961, 62 y.o.   MRN: [id number]   Advanced Heart Failure VAD Team Note  PCP-Cardiologist: Nona Dell, MD   Subjective:   CT A/P 10/15: Possible mild ileus or enteritis, no evidence of ischemic bowel, obstruction, or VAD tunnel abscess  CT of Neck (neck pain) did not show any acute process.  Completed IV antibiotics. Now on oral antibiotics.   Ready to go home, feels better today. Happier with pain regimen that he's on. Regretful of how he acted yesterday 2/2 pain.    LVAD INTERROGATION:  HeartMate III LVAD:   Flow 4.2  liters/min, speed 5600, power 4.1 PI 5.2   >100 PI events VAD interrogated personally  Objective:    Vital Signs:   Temp:  [97.7 F (36.5 C)-98.8 F (37.1 C)] 97.9 F (36.6 C) (10/24 1232) Pulse Rate:  [80-88] 80 (10/24 1232) Resp:  [15-19] 18 (10/24 1232) BP: (61-120)/(45-93) 108/93 (10/24 1232) SpO2:  [95 %-97 %] 95 % (10/24 0847) Weight:  [88.5 kg] 88.5 kg (10/24 0405) Last BM Date : 10/01/23 Mean arterial Pressure 80s-90s  Intake/Output:   Intake/Output Summary (Last 24 hours) at 10/02/2023 1259 Last data filed at 10/02/2023 0827 Gross per 24 hour  Intake 720 ml  Output 400 ml  Net 320 ml     Physical Exam  General:  Well appearing. No resp difficulty HEENT: Normal Neck: supple. JVP ~5. Carotids 2+ bilat; no bruits. No lymphadenopathy or thyromegaly appreciated. Cor: Mechanical heart sounds with LVAD hum present. Lungs: Clear Abdomen: soft, nontender, distended. No hepatosplenomegaly. No bruits or masses. Good bowel sounds. Large abd hernia Driveline: C/D/I; securement device intact and driveline incorporated Extremities: no cyanosis, clubbing, rash, edema Neuro: alert & orientedx3, cranial nerves grossly intact. moves all 4 extremities w/o difficulty. Affect pleasant  Telemetry   NSR 90s, intt PVCs (Personally reviewed)    Labs   Basic Metabolic Panel: Recent Labs  Lab  09/27/23 0847 09/28/23 1016 09/29/23 0929 09/30/23 0949 10/01/23 0620  NA 132* 133* 131* 133* 131*  K 4.1 4.0 4.5 4.2 4.2  CL 101 98 101 96* 98  CO2 23 23 25 24 23   GLUCOSE 179* 205* 256* 172* 218*  BUN 22 19 20 18 18   CREATININE 0.97 0.99 0.96 0.76 0.83  CALCIUM 8.1* 9.1 7.8* 8.9 8.8*    Liver Function Tests: No results for input(s): "AST", "ALT", "ALKPHOS", "BILITOT", "PROT", "ALBUMIN" in the last 168 hours.  No results for input(s): "LIPASE", "AMYLASE" in the last 168 hours.  No results for input(s): "AMMONIA" in the last 168 hours.  CBC: Recent Labs  Lab 09/27/23 0847 09/28/23 1016 09/29/23 0929 09/30/23 0949 10/01/23 0620  WBC 9.0 8.5 8.8 11.6* 9.1  HGB 12.3* 13.8 10.8* 11.3* 11.0*  HCT 37.6* 42.4 32.9* 34.5* 33.7*  MCV 77.8* 77.7* 78.1* 78.8* 78.2*  PLT 243 258 216 266 233    INR: Recent Labs  Lab 09/28/23 1447 09/29/23 0929 09/30/23 0949 10/01/23 0620 10/02/23 0936  INR 2.5* 2.3* 2.0* 1.7* 1.9*    Other results: EKG:    Imaging   No results found.   Medications:     Scheduled Medications:  allopurinol  100 mg Oral Daily   amLODipine  10 mg Oral Daily   ciprofloxacin  750 mg Oral BID   feeding supplement  237 mL Oral TID BM   gabapentin  400 mg Oral TID   HYDROmorphone HCl  8 mg Oral Q24H  insulin aspart  0-15 Units Subcutaneous TID WC   insulin glargine-yfgn  12 Units Subcutaneous Daily   lidocaine  1 patch Transdermal Q24H   losartan  50 mg Oral Daily   metoprolol succinate  25 mg Oral Daily   minocycline  200 mg Oral BID   mirtazapine  15 mg Oral QHS   multivitamin with minerals  1 tablet Per Tube Daily   polyethylene glycol  17 g Oral BID   rosuvastatin  20 mg Oral Daily   senna-docusate  2 tablet Oral QHS   sertraline  50 mg Oral Daily   spironolactone  25 mg Oral Daily   tamsulosin  0.4 mg Oral Daily   Warfarin - Pharmacist Dosing Inpatient   Does not apply q1600    Infusions:    PRN Medications: acetaminophen,  hydrALAZINE, HYDROmorphone, ondansetron (ZOFRAN) IV   Patient Profile   62 y.o. male with history of CAD s/p CABG, chronic systolic CHF s/p HM III VAD, chronic driveline infection, DM II, COPD, HTN, large abdominal hernia.  Presenting with severe abdominal pain X 2-3 days. Admitted for further workup and pain control.   Assessment/Plan:   1. Abdominal pain with n/v:  Associated with nausea and vomiting. CT A/P 10/15 showed possible mild ileus or enteritis, no evidence of ischemic bowel, obstruction, or VAD tunnel abscess.  Lipase and LFTs negative. Lactic acid elevated at 3.6 on admit but cleared down to 1.5. WBCs down w/ IV abx, 26>>10k-> 9k. Urine culture and BC NGTD.  Has been seen by TCTS and ID.  Suspect presentation is due to ileus + acute on chronic driveline infection. - Cipro switched to daptomycin and cefepime due to poor po intake to cover MRSA and Pseudomonas, kept on minocycline to cover Acinetobacter.  - Intolerant abdominal binder. Had BM today. No pain.  - Palliative care team now following for pain control. Started on 8mg  daily exalgo, 2 mg PRN dilaudid Q6, +lidocaine patch. Happier with this pain regimen.  2. 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  Cultures have grown acinetobacter, MRSA and pseudomonas He was readmitted with driveline infection in 2/24, completed course of daptomycin for MRSA.  Discharged from the hospital on 09/04/2023 with recurrent driveline infection growing Pseudomonas.   On chronic minocycline and ciprofloxacin at home as above.  Wound culture 10/1 grew MRSA. Reports compliance with home regimen. Driveline appeared stable on CT A/P this admit. Suspect persistent indolent driveline infection.  - per ID,  chronic ciprofloxacin and minocycline at discharged.  - Completed 7 days IV abx (cefepime/daptomycin)  2.  Chronic systolic CHF s/p Heartmate 3 LVAD on  11/17/20. Echo this admit LVAD cannula in apex. Interventricular septum is midline. LVEF 20-25%, RV moderately reduced.  Received IVF on admit due to poor po intake with ileus.  INR goal 2 - 2.5. INR 1.7 . Back on home regimen and appetite is improving.  -LDH stable.  - Continue amlodipine 10 mg daily - Continue losartan 50 mg daily  - Continue spiro to 25 mg daily.  - Continue metoprolol xl 25 mg daily 3. 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  AKI in setting of infection, elevated lactic acid and suspected volume depletion. Improved with IVF.  - Resolved.  4.  CAD S/p CABG 12/20 - Continue Crestor 20 mg daily.  5.  Paroxysmal atrial fibrillation status post maze and left atrial  appendage clip. NSR this admission. - Off amiodarone with hyperthyroidism. 6. Type 2 diabetes -SSI 7. Hx methamphetamine abuse:  - UDS negative 8. 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  Followed by endocrinology. He is now off amiodarone and methimazole.  9. Anemia (chronic blood loss):   Hgb stable.  10. COPD: Prior smoker.  - On trelegy at home 11. Ventral hernia: Large ventral hernia, soft and nontender.  Does not appear incarcerated.  No bowel obstruction on 09/23/23 abdominal CT.  Not candidate for surgery due to proximity of hernia to driveline tunnel. Intolerant abdominal binder.   Appears stable for discharge today, pending MD eval. Pain better controlled.   I reviewed the LVAD parameters from today, and compared the results to the patient's prior recorded data.  No programming changes were made.  The LVAD is functioning within specified parameters.  The patient performs LVAD self-test daily.  LVAD interrogation was negative for any significant power changes, alarms or PI events/speed drops.  LVAD equipment check completed and is in good working order.  Back-up equipment present.   LVAD education done on emergency procedures and precautions and  reviewed exit site care.  Length of Stay: 9  Alen Bleacher, NP 10/02/2023, 12:59 PM  VAD Team --- VAD ISSUES ONLY--- Pager [phone] (7am - 7am)  Advanced Heart Failure Team  Pager [phone] (M-F; 7a - 5p)  Please contact [facility] for night-coverage after hours (5p -7a ) and weekends on [email or web address]

## 2023-10-02 NOTE — Progress Notes (Signed)
PHARMACY - ANTICOAGULATION CONSULT NOTE  Pharmacy Consult for warfarin Indication:  HM3 LVAD  Allergies  Allergen Reactions   Firvanq [Vancomycin] Nausea And Vomiting and Other (See Comments)    Arthralgias     Patient Measurements: Height: 5\' 10"  (177.8 cm) Weight: 88.5 kg (195 lb 1.7 oz) IBW/kg (Calculated) : 73  Vital Signs: Temp: 97.8 F (36.6 C) (10/24 0847) Temp Source: Oral (10/24 0847) BP: 109/93 (10/24 0847) Pulse Rate: 88 (10/24 0847)  Labs: Recent Labs    09/30/23 0949 10/01/23 0620 10/02/23 0936  HGB 11.3* 11.0*  --   HCT 34.5* 33.7*  --   PLT 266 233  --   LABPROT 22.9* 20.3* 21.8*  INR 2.0* 1.7* 1.9*  CREATININE 0.76 0.83  --     Estimated Creatinine Clearance: 103.4 mL/min (by C-G formula based on SCr of 0.83 mg/dL).   Medical History: Past Medical History:  Diagnosis Date   "    Arthritis    CAD (coronary artery disease)    a. s/p CABG in 11/2019 with LIMA-LAD, SVG-OM1, SVG-PDA and SVG-D1   CHF (congestive heart failure) (HCC)    a. EF < 20% by echo in 11/2019   Essential hypertension    PAF (paroxysmal atrial fibrillation) (HCC)    Type 2 diabetes mellitus (HCC)     Medications:  Medications Prior to Admission  Medication Sig Dispense Refill Last Dose   acetaminophen (TYLENOL) 325 MG tablet Take 2 tablets (650 mg total) by mouth every 4 (four) hours as needed for headache or mild pain.   09/22/2023   albuterol (PROVENTIL) (2.5 MG/3ML) 0.083% nebulizer solution INHALE 3 ML BY NEBULIZATION EVERY 6 HOURS AS NEEDED FOR WHEEZING OR SHORTNESS OF BREATH (Patient taking differently: Take 2.5 mg by nebulization every 6 (six) hours as needed for shortness of breath.) 450 mL 1 09/22/2023   albuterol (VENTOLIN HFA) 108 (90 Base) MCG/ACT inhaler Inhale 2 puffs into the lungs every 6 (six) hours as needed for wheezing or shortness of breath.   09/22/2023   allopurinol (ZYLOPRIM) 100 MG tablet Take 1 tablet (100 mg total) by mouth daily. 90 tablet 3  09/22/2023   amLODipine (NORVASC) 10 MG tablet Take 1 tablet (10 mg total) by mouth daily. 30 tablet 5 09/22/2023   ciprofloxacin (CIPRO) 750 MG tablet Take 1 tablet (750 mg total) by mouth 2 (two) times daily. 60 tablet 3 09/22/2023   colchicine 0.6 MG tablet Take 1 tablet (0.6 mg total) by mouth daily as needed (gout pain). (Patient taking differently: Take 0.6 mg by mouth daily.)   09/22/2023   COMBIVENT RESPIMAT 20-100 MCG/ACT AERS respimat Inhale 1 puff into the lungs every 6 (six) hours.   09/22/2023   fluticasone (FLONASE ALLERGY RELIEF) 50 MCG/ACT nasal spray Place 2 sprays into both nostrils as needed for allergies or rhinitis.   09/22/2023   gabapentin (NEURONTIN) 300 MG capsule Take 1 capsule (300 mg total) by mouth 3 (three) times daily. 90 capsule 0 09/22/2023   glipiZIDE (GLUCOTROL) 5 MG tablet Take 1 tablet (5 mg total) by mouth daily. 30 tablet 11 09/22/2023   guaifenesin (HUMIBID E) 400 MG TABS tablet Take 400 mg by mouth every 6 (six) hours as needed (for congestion).   09/22/2023   loratadine (CLARITIN) 10 MG tablet Take 1 tablet (10 mg total) by mouth daily. 30 tablet 6 09/22/2023   losartan (COZAAR) 50 MG tablet Take 1 tablet (50 mg total) by mouth daily. 30 tablet 5 09/22/2023   Menthol,  Topical Analgesic, (BIOFREEZE) 10 % CREA Apply 1 Application topically 2 (two) times daily as needed (Apply to all joints for pain).   09/22/2023   metFORMIN (GLUCOPHAGE) 1000 MG tablet Take 1 tablet (1,000 mg total) by mouth 2 (two) times daily. 120 tablet 6 09/22/2023   metoprolol succinate (TOPROL XL) 25 MG 24 hr tablet Take 1 tablet (25 mg total) by mouth daily. 30 tablet 5 09/22/2023   minocycline (MINOCIN) 100 MG capsule Take 2 capsules (200 mg total) by mouth 2 (two) times daily. 120 capsule 3 09/22/2023   mirtazapine (REMERON) 15 MG tablet Take 1 tablet (15 mg total) by mouth at bedtime.   Past Week   Multiple Vitamin (MULTIVITAMIN WITH MINERALS) TABS tablet Take 1 tablet by mouth daily.    09/22/2023   Oxycodone HCl 10 MG TABS Take 1 tablet (10 mg total) by mouth every 6 (six) hours as needed. 28 tablet 0 Past Month   pantoprazole (PROTONIX) 40 MG tablet Take 1 tablet (40 mg total) by mouth daily. 90 tablet 2 09/22/2023   rosuvastatin (CRESTOR) 20 MG tablet Take 1 tablet (20 mg total) by mouth daily. 30 tablet 5 09/22/2023   sertraline (ZOLOFT) 50 MG tablet Take 1 tablet (50 mg total) by mouth daily. (Patient taking differently: Take 50 mg by mouth at bedtime.) 90 tablet 3 Past Week   spironolactone (ALDACTONE) 25 MG tablet Take 0.5 tablets (12.5 mg total) by mouth daily. (Patient taking differently: Take 25 mg by mouth daily.) 30 tablet 5 09/22/2023   tamsulosin (FLOMAX) 0.4 MG CAPS capsule Take 1 capsule (0.4 mg total) by mouth daily. 90 capsule 3 09/22/2023   thiamine (VITAMIN B-1) 100 MG tablet Take 1 tablet (100 mg total) by mouth daily.   09/22/2023   traMADol (ULTRAM) 50 MG tablet Take 2 tablets (100 mg total) by mouth every 12 (twelve) hours as needed. 120 tablet 0 Past Week   traZODone (DESYREL) 150 MG tablet Take 1 tablet (150 mg total) by mouth at bedtime. 90 tablet 3 Past Month   TRELEGY ELLIPTA 100-62.5-25 MCG/ACT AEPB Inhale 1 puff into the lungs daily. 1 each 6 09/22/2023   warfarin (COUMADIN) 4 MG tablet Take 1-1.5 tablets (4-6 mg total) by mouth See admin instructions. Take 1 tablet (4 mg) every Tuesday and Thursday, Take 1.5 tabs (6 mg) all other days 45 tablet 11 09/22/2023 at 0800   Scheduled:   allopurinol  100 mg Oral Daily   amLODipine  10 mg Oral Daily   ciprofloxacin  750 mg Oral BID   feeding supplement  237 mL Oral TID BM   gabapentin  400 mg Oral TID   HYDROmorphone HCl  8 mg Oral Q24H   insulin aspart  0-15 Units Subcutaneous TID WC   insulin glargine-yfgn  12 Units Subcutaneous Daily   lidocaine  1 patch Transdermal Q24H   losartan  50 mg Oral Daily   metoprolol succinate  25 mg Oral Daily   minocycline  200 mg Oral BID   mirtazapine  15 mg  Oral QHS   multivitamin with minerals  1 tablet Per Tube Daily   polyethylene glycol  17 g Oral BID   rosuvastatin  20 mg Oral Daily   senna-docusate  2 tablet Oral QHS   sertraline  50 mg Oral Daily   spironolactone  25 mg Oral Daily   tamsulosin  0.4 mg Oral Daily   Warfarin - Pharmacist Dosing Inpatient   Does not apply q1600  Assessment: Kevin Underwood is a 62YOM who was admitted for abdominal pain and pain control. Patient has a HM3 LVAD and on warfarin PTA.  Warfarin PTA regimen: 4 mg T/Th and 6 mg EOD Last dose PTA: 10/14 AM  Notable DDI: Ciprofloxacin resumed on 10/23  Question if INR elevation on admission related to low oral intake with abdominal pain -  eating better during this hospitalization. Noted normal diet typically consists of high amounts of greens at home when eating well. While supra-therapeutic on admission, has been subtherapeutic on outpatient INR checks previously.   10/24: INR today is subtherapeutic at 1.9. CBC has remained stable throughout this admission.  Goal of Therapy:  INR 2.0-2.5 Monitor platelets by anticoagulation protocol: Yes   Plan:  Warfarin 4 mg x1 today Monitor INR, CBC, and bleeding daily  Recommended regimen at discharge:  Warfarin 6 mg on Wednesdays and 4 mg every other day  Thank you for allowing pharmacy to participate in this patient's care,  Enos Fling, PharmD PGY-1 Acute Care Pharmacy Resident 10/02/2023 10:58 AM  Please check AMION for all [facility] Pharmacy phone numbers After 10:00 PM, call Main Pharmacy [phone]

## 2023-10-02 NOTE — Plan of Care (Signed)
  Problem: Cardiac: Goal: LVAD will function as expected and patient will experience no clinical alarms Outcome: Progressing   Problem: Nutrition: Goal: Adequate nutrition will be maintained Outcome: Progressing   Problem: Coping: Goal: Level of anxiety will decrease Outcome: Progressing   Problem: Elimination: Goal: Will not experience complications related to bowel motility Outcome: Progressing Goal: Will not experience complications related to urinary retention Outcome: Progressing   Problem: Pain Managment: Goal: General experience of comfort will improve Outcome: Progressing   Problem: Safety: Goal: Ability to remain free from injury will improve Outcome: Progressing   Problem: Skin Integrity: Goal: Risk for impaired skin integrity will decrease Outcome: Progressing

## 2023-10-02 NOTE — Progress Notes (Signed)
OT Cancellation Note  Patient Details Name: Kevin Underwood. MRN: [id number] DOB: [birth year]   Cancelled Treatment:    Reason Eval/Treat Not Completed: Other (comment).  Pt politely declines therapy and states he is going home today.  No order noted for discharge but last NP note says he may if MD agrees that he is ready.  Has all needed DME at home per discussion and will have assist from spouse.  Will check back if here tomorrow.    Karena Kinker OTR/L 10/02/2023, 11:25 AM

## 2023-10-02 NOTE — Progress Notes (Signed)
LVAD Coordinator Rounding Note:  Admitted 09/23/23 to Heart Failure service from [facility] with worsening abdominal pain and nausea x 2-3 days.   CT abd/pelvis 09/23/23 1. Mild fluid distension of left upper quadrant jejunal bowel loops without discrete transition point suggestive of mild ileus or enteritis. 2. Otherwise no CT evidence for acute intra-abdominal or pelvic abnormality. LVAD with similar skin thickening and circumferential soft tissue thickening about the drive line in the upper abdomen without development of new fluid around the drive line since the prior exam. 3. 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 Aortic atherosclerosis.  Pt sitting on edge of bed this morning. States pain is much improved after medication adjustments yesterday afternoon.  Echo completed 10/17. EF 20-25 % RV moderately reduced.   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 Blood cxs 10/15 NGTD.   Pending discharge today. VAD Coordinator made appointment at [facility] 11/20 at 9:30. Pt educated on upcoming all upcoming appointments prior to discharge.   Vital signs: Temp: 97.8 HR: 88 Doppler Pressure: 92 Automatic BP: 109/93 (100) O2 Sat: 95% on RA Wt: 185.6>186.7>186.2>188.7>189.2>195.1 lbs  LVAD interrogation reveals:  Speed: 5600 Flow: 4.2 Power: 4.1 w PI: 3.4  Alarms: none Events: >100 PI events today Hct: 32  Fixed speed:  5600 Low speed limit:  5300  Drive Line: Existing VAD dressing removed and site care performed using sterile technique by Dr. Maren Beach. Wound bed cleansed with VASHE solution. Skin surrounding wound bed cleaned with Chlora prep applicators x 2, allowed to dry. Followed by VASHE soaked 2x2 flush to exit site and then covered with several dry 4 x 4s. Drive line partially incorporated. Small amount of thick yellow/serosanguinous drainage  noted. No redness, tenderness, foul odor or rash noted. Ulcerated area distal to drive line cleansed with VASHE solution. VASHE moistened 2 x 2 placed over area and covered with gauze. 2 brown rectangular mepitel dressings applied over reddened skin/skin tears surrounding dressing prior to covering entire area with 3 large tegaderms. Drive line anchor reapplied on top of mepitel dressing to prevent skin breakdown. Continue daily wet to dry dressing changes using VASHE solution. Pt given 7 daily kits, 7 anchor, cotton tip applicators, two boxes of tegaderm and VASHE for home use.      Pt had previous drive line debridement in OR with placement of instillation wound vac on 07/09/23 with Dr Donata Clay. This was changed to wet/dry on Friday 8/2. No further debridement of the VAD tunnel would be possible without injury to the outflow graft superiorly or the peritoneal cavity inferior to the power cord per Dr Donata Clay.   Labs:  LDH trend: 149>200>150  INR trend: 3.2>2.9>2.3>1.7>1.9  WBC trend: 16>26>10.2>9.1>11.6>9.1  Lactic acid trend: 3.6>2.6>3.3>2.7>2.8>2.0>1.5  Anticoagulation Plan: -INR Goal: 2.0 - 2.5 -ASA Dose: none  ICD: N/A  Infection:  09/23/23>>resp panel>>negative 09/23/23>> blood cxs>> no growth 3 days; final pending 09/24/23>>urine cx>> no growth; final  Drips:   Plan/Recommendations:  Page VAD coordinator with equipment issues or driveline problems Daily drive line wet to dry dressing changes with VASHE solution per bedside RN.   Simmie Davies RN,BSN VAD Coordinator  Office: [phone]  24/7 Pager: [phone]

## 2023-10-02 NOTE — Telephone Encounter (Signed)
Pt's wife called stating [facility] [place] does not have Hydromorphone HCL in stock and is requesting [facility] [place]. VAD Coordinator called [facility] to confirm. Dr. Elwyn Lade notified and will [facility] [address] in [place]. Pt's wife updated.  Simmie Davies RN, BSN VAD Coordinator 24/7 Pager [phone]

## 2023-10-03 ENCOUNTER — Other Ambulatory Visit (HOSPITAL_COMMUNITY): Payer: Self-pay | Admitting: Internal Medicine

## 2023-10-03 ENCOUNTER — Other Ambulatory Visit (HOSPITAL_COMMUNITY): Payer: Self-pay

## 2023-10-03 MED ORDER — HYDROMORPHONE HCL ER 8 MG PO TB24: 8.0000 mg | ORAL_TABLET | ORAL | 0 refills | Status: DC

## 2023-10-03 MED ORDER — HYDROMORPHONE HCL ER 8 MG PO TB24: 8.0000 mg | ORAL_TABLET | ORAL | Status: DC

## 2023-10-03 NOTE — Addendum Note (Signed)
Addended by: Alen Bleacher on: 10/03/2023 07:55 AM   Modules accepted: Orders

## 2023-10-03 NOTE — Addendum Note (Signed)
Addended by: Alen Bleacher on: 10/03/2023 07:51 AM   Modules accepted: Orders

## 2023-10-08 ENCOUNTER — Emergency Department (HOSPITAL_COMMUNITY): Payer: 59

## 2023-10-08 ENCOUNTER — Inpatient Hospital Stay (HOSPITAL_COMMUNITY)
Admission: EM | Admit: 2023-10-08 | Discharge: 2023-10-17 | DRG: 314 | Disposition: A | Discharge status: Home Health | Payer: 59 | Attending: Cardiology | Admitting: Cardiology

## 2023-10-08 ENCOUNTER — Other Ambulatory Visit: Payer: Self-pay

## 2023-10-08 ENCOUNTER — Encounter (HOSPITAL_COMMUNITY): Payer: Self-pay | Admitting: Emergency Medicine

## 2023-10-08 DIAGNOSIS — R0602 Shortness of breath: Secondary | ICD-10-CM | POA: Diagnosis not present

## 2023-10-08 DIAGNOSIS — Z8249 Family history of ischemic heart disease and other diseases of the circulatory system: Secondary | ICD-10-CM

## 2023-10-08 DIAGNOSIS — Z833 Family history of diabetes mellitus: Secondary | ICD-10-CM

## 2023-10-08 DIAGNOSIS — Z888 Allergy status to other drugs, medicaments and biological substances status: Secondary | ICD-10-CM

## 2023-10-08 DIAGNOSIS — R7881 Bacteremia: Secondary | ICD-10-CM | POA: Diagnosis present

## 2023-10-08 DIAGNOSIS — M549 Dorsalgia, unspecified: Secondary | ICD-10-CM | POA: Diagnosis present

## 2023-10-08 DIAGNOSIS — T827XXA Infection and inflammatory reaction due to other cardiac and vascular devices, implants and grafts, initial encounter: Principal | ICD-10-CM | POA: Diagnosis present

## 2023-10-08 DIAGNOSIS — K439 Ventral hernia without obstruction or gangrene: Secondary | ICD-10-CM | POA: Diagnosis present

## 2023-10-08 DIAGNOSIS — B9561 Methicillin susceptible Staphylococcus aureus infection as the cause of diseases classified elsewhere: Secondary | ICD-10-CM | POA: Diagnosis present

## 2023-10-08 DIAGNOSIS — K567 Ileus, unspecified: Secondary | ICD-10-CM | POA: Diagnosis not present

## 2023-10-08 DIAGNOSIS — I480 Paroxysmal atrial fibrillation: Secondary | ICD-10-CM | POA: Diagnosis present

## 2023-10-08 DIAGNOSIS — Y831 Surgical operation with implant of artificial internal device as the cause of abnormal reaction of the patient, or of later complication, without mention of misadventure at the time of the procedure: Secondary | ICD-10-CM | POA: Diagnosis present

## 2023-10-08 DIAGNOSIS — D500 Iron deficiency anemia secondary to blood loss (chronic): Secondary | ICD-10-CM | POA: Diagnosis present

## 2023-10-08 DIAGNOSIS — N170 Acute kidney failure with tubular necrosis: Secondary | ICD-10-CM | POA: Diagnosis not present

## 2023-10-08 DIAGNOSIS — Z765 Malingerer [conscious simulation]: Secondary | ICD-10-CM

## 2023-10-08 DIAGNOSIS — Z5982 Transportation insecurity: Secondary | ICD-10-CM

## 2023-10-08 DIAGNOSIS — Z95811 Presence of heart assist device: Secondary | ICD-10-CM

## 2023-10-08 DIAGNOSIS — Z555 Less than a high school diploma: Secondary | ICD-10-CM

## 2023-10-08 DIAGNOSIS — Z7901 Long term (current) use of anticoagulants: Secondary | ICD-10-CM

## 2023-10-08 DIAGNOSIS — R627 Adult failure to thrive: Secondary | ICD-10-CM | POA: Diagnosis present

## 2023-10-08 DIAGNOSIS — N179 Acute kidney failure, unspecified: Principal | ICD-10-CM

## 2023-10-08 DIAGNOSIS — Z1623 Resistance to quinolones and fluoroquinolones: Secondary | ICD-10-CM | POA: Diagnosis present

## 2023-10-08 DIAGNOSIS — Z79899 Other long term (current) drug therapy: Secondary | ICD-10-CM

## 2023-10-08 DIAGNOSIS — Z515 Encounter for palliative care: Secondary | ICD-10-CM

## 2023-10-08 DIAGNOSIS — B965 Pseudomonas (aeruginosa) (mallei) (pseudomallei) as the cause of diseases classified elsewhere: Secondary | ICD-10-CM | POA: Diagnosis present

## 2023-10-08 DIAGNOSIS — I5022 Chronic systolic (congestive) heart failure: Secondary | ICD-10-CM | POA: Diagnosis present

## 2023-10-08 DIAGNOSIS — Z66 Do not resuscitate: Secondary | ICD-10-CM | POA: Diagnosis present

## 2023-10-08 DIAGNOSIS — I110 Hypertensive heart disease with heart failure: Secondary | ICD-10-CM | POA: Diagnosis present

## 2023-10-08 DIAGNOSIS — Z951 Presence of aortocoronary bypass graft: Secondary | ICD-10-CM

## 2023-10-08 DIAGNOSIS — Z825 Family history of asthma and other chronic lower respiratory diseases: Secondary | ICD-10-CM

## 2023-10-08 DIAGNOSIS — I255 Ischemic cardiomyopathy: Secondary | ICD-10-CM | POA: Diagnosis present

## 2023-10-08 DIAGNOSIS — I2510 Atherosclerotic heart disease of native coronary artery without angina pectoris: Secondary | ICD-10-CM | POA: Diagnosis present

## 2023-10-08 DIAGNOSIS — Z87891 Personal history of nicotine dependence: Secondary | ICD-10-CM

## 2023-10-08 DIAGNOSIS — Z981 Arthrodesis status: Secondary | ICD-10-CM

## 2023-10-08 DIAGNOSIS — Z7984 Long term (current) use of oral hypoglycemic drugs: Secondary | ICD-10-CM

## 2023-10-08 DIAGNOSIS — J449 Chronic obstructive pulmonary disease, unspecified: Secondary | ICD-10-CM | POA: Diagnosis present

## 2023-10-08 DIAGNOSIS — E119 Type 2 diabetes mellitus without complications: Secondary | ICD-10-CM | POA: Diagnosis present

## 2023-10-08 NOTE — ED Triage Notes (Signed)
Pt to ED via [facility] from home c/o SOB starting aroud noon today, productive clear cough, states chest pain with coughing.  Pt also has lower back pain that has been there since getting hernia 3 months ago.  Pt is LVAD patient and placed on bedside power by rapid response nurse on arrival.  Pt A&Ox4, pale, hypotensive and given 600cc fluid en route.

## 2023-10-09 ENCOUNTER — Emergency Department (HOSPITAL_COMMUNITY): Payer: 59

## 2023-10-09 DIAGNOSIS — T827XXA Infection and inflammatory reaction due to other cardiac and vascular devices, implants and grafts, initial encounter: Secondary | ICD-10-CM | POA: Diagnosis present

## 2023-10-09 DIAGNOSIS — Z7189 Other specified counseling: Secondary | ICD-10-CM

## 2023-10-09 DIAGNOSIS — N179 Acute kidney failure, unspecified: Secondary | ICD-10-CM

## 2023-10-09 DIAGNOSIS — N170 Acute kidney failure with tubular necrosis: Secondary | ICD-10-CM | POA: Diagnosis not present

## 2023-10-09 DIAGNOSIS — R627 Adult failure to thrive: Secondary | ICD-10-CM | POA: Diagnosis present

## 2023-10-09 DIAGNOSIS — Z1623 Resistance to quinolones and fluoroquinolones: Secondary | ICD-10-CM | POA: Diagnosis present

## 2023-10-09 DIAGNOSIS — Z981 Arthrodesis status: Secondary | ICD-10-CM | POA: Diagnosis not present

## 2023-10-09 DIAGNOSIS — D500 Iron deficiency anemia secondary to blood loss (chronic): Secondary | ICD-10-CM | POA: Diagnosis present

## 2023-10-09 DIAGNOSIS — Z95811 Presence of heart assist device: Secondary | ICD-10-CM | POA: Diagnosis not present

## 2023-10-09 DIAGNOSIS — B9561 Methicillin susceptible Staphylococcus aureus infection as the cause of diseases classified elsewhere: Secondary | ICD-10-CM | POA: Diagnosis present

## 2023-10-09 DIAGNOSIS — Z79899 Other long term (current) drug therapy: Secondary | ICD-10-CM | POA: Diagnosis not present

## 2023-10-09 DIAGNOSIS — Z515 Encounter for palliative care: Secondary | ICD-10-CM

## 2023-10-09 DIAGNOSIS — I255 Ischemic cardiomyopathy: Secondary | ICD-10-CM | POA: Diagnosis present

## 2023-10-09 DIAGNOSIS — Z7984 Long term (current) use of oral hypoglycemic drugs: Secondary | ICD-10-CM | POA: Diagnosis not present

## 2023-10-09 DIAGNOSIS — I110 Hypertensive heart disease with heart failure: Secondary | ICD-10-CM | POA: Diagnosis present

## 2023-10-09 DIAGNOSIS — I5022 Chronic systolic (congestive) heart failure: Secondary | ICD-10-CM | POA: Diagnosis present

## 2023-10-09 DIAGNOSIS — T827XXD Infection and inflammatory reaction due to other cardiac and vascular devices, implants and grafts, subsequent encounter: Secondary | ICD-10-CM | POA: Diagnosis not present

## 2023-10-09 DIAGNOSIS — I38 Endocarditis, valve unspecified: Secondary | ICD-10-CM | POA: Diagnosis not present

## 2023-10-09 DIAGNOSIS — I2510 Atherosclerotic heart disease of native coronary artery without angina pectoris: Secondary | ICD-10-CM | POA: Diagnosis present

## 2023-10-09 DIAGNOSIS — I480 Paroxysmal atrial fibrillation: Secondary | ICD-10-CM | POA: Diagnosis present

## 2023-10-09 DIAGNOSIS — R7881 Bacteremia: Secondary | ICD-10-CM | POA: Diagnosis present

## 2023-10-09 DIAGNOSIS — Z87891 Personal history of nicotine dependence: Secondary | ICD-10-CM | POA: Diagnosis not present

## 2023-10-09 DIAGNOSIS — Z951 Presence of aortocoronary bypass graft: Secondary | ICD-10-CM | POA: Diagnosis not present

## 2023-10-09 DIAGNOSIS — E119 Type 2 diabetes mellitus without complications: Secondary | ICD-10-CM | POA: Diagnosis present

## 2023-10-09 DIAGNOSIS — Y831 Surgical operation with implant of artificial internal device as the cause of abnormal reaction of the patient, or of later complication, without mention of misadventure at the time of the procedure: Secondary | ICD-10-CM | POA: Diagnosis present

## 2023-10-09 DIAGNOSIS — J449 Chronic obstructive pulmonary disease, unspecified: Secondary | ICD-10-CM | POA: Diagnosis present

## 2023-10-09 DIAGNOSIS — R0602 Shortness of breath: Secondary | ICD-10-CM | POA: Diagnosis present

## 2023-10-09 DIAGNOSIS — K567 Ileus, unspecified: Secondary | ICD-10-CM | POA: Diagnosis not present

## 2023-10-09 DIAGNOSIS — B965 Pseudomonas (aeruginosa) (mallei) (pseudomallei) as the cause of diseases classified elsewhere: Secondary | ICD-10-CM | POA: Diagnosis present

## 2023-10-09 DIAGNOSIS — Z66 Do not resuscitate: Secondary | ICD-10-CM | POA: Diagnosis present

## 2023-10-09 LAB — CBC WITH DIFFERENTIAL/PLATELET
Abs Immature Granulocytes: 0.42 10*3/uL — ABNORMAL HIGH (ref 0.00–0.07)
Basophils Absolute: 0.1 10*3/uL (ref 0.0–0.1)
Basophils Relative: 0 %
Eosinophils Absolute: 0 10*3/uL (ref 0.0–0.5)
Eosinophils Relative: 0 %
HCT: 24.8 % — ABNORMAL LOW (ref 39.0–52.0)
Hemoglobin: 8.1 g/dL — ABNORMAL LOW (ref 13.0–17.0)
Immature Granulocytes: 3 %
Lymphocytes Relative: 3 %
Lymphs Abs: 0.5 10*3/uL — ABNORMAL LOW (ref 0.7–4.0)
MCH: 25.5 pg — ABNORMAL LOW (ref 26.0–34.0)
MCHC: 32.7 g/dL (ref 30.0–36.0)
MCV: 78 fL — ABNORMAL LOW (ref 80.0–100.0)
Monocytes Absolute: 1.5 10*3/uL — ABNORMAL HIGH (ref 0.1–1.0)
Monocytes Relative: 10 %
Neutro Abs: 12.9 10*3/uL — ABNORMAL HIGH (ref 1.7–7.7)
Neutrophils Relative %: 84 %
Platelets: 162 10*3/uL (ref 150–400)
RBC: 3.18 MIL/uL — ABNORMAL LOW (ref 4.22–5.81)
RDW: 14.8 % (ref 11.5–15.5)
WBC: 15.4 10*3/uL — ABNORMAL HIGH (ref 4.0–10.5)
nRBC: 0 % (ref 0.0–0.2)

## 2023-10-09 LAB — COMPREHENSIVE METABOLIC PANEL
ALT: 31 U/L (ref 0–44)
AST: 33 U/L (ref 15–41)
Albumin: 1.9 g/dL — ABNORMAL LOW (ref 3.5–5.0)
Alkaline Phosphatase: 135 U/L — ABNORMAL HIGH (ref 38–126)
Anion gap: 15 (ref 5–15)
BUN: 49 mg/dL — ABNORMAL HIGH (ref 8–23)
CO2: 17 mmol/L — ABNORMAL LOW (ref 22–32)
Calcium: 7.8 mg/dL — ABNORMAL LOW (ref 8.9–10.3)
Chloride: 98 mmol/L (ref 98–111)
Creatinine, Ser: 2.22 mg/dL — ABNORMAL HIGH (ref 0.61–1.24)
GFR, Estimated: 33 mL/min — ABNORMAL LOW (ref >60)
Glucose, Bld: 132 mg/dL — ABNORMAL HIGH (ref 70–99)
Potassium: 4.1 mmol/L (ref 3.5–5.1)
Sodium: 130 mmol/L — ABNORMAL LOW (ref 135–145)
Total Bilirubin: 0.4 mg/dL (ref 0.3–1.2)
Total Protein: 5.1 g/dL — ABNORMAL LOW (ref 6.5–8.1)

## 2023-10-09 LAB — GLUCOSE, CAPILLARY
Glucose-Capillary: 118 mg/dL — ABNORMAL HIGH (ref 70–99)
Glucose-Capillary: 155 mg/dL — ABNORMAL HIGH (ref 70–99)
Glucose-Capillary: 175 mg/dL — ABNORMAL HIGH (ref 70–99)
Glucose-Capillary: 149 mg/dL — ABNORMAL HIGH (ref 70–99)

## 2023-10-09 LAB — BASIC METABOLIC PANEL
Anion gap: 14 (ref 5–15)
BUN: 44 mg/dL — ABNORMAL HIGH (ref 8–23)
CO2: 21 mmol/L — ABNORMAL LOW (ref 22–32)
Calcium: 8.4 mg/dL — ABNORMAL LOW (ref 8.9–10.3)
Chloride: 98 mmol/L (ref 98–111)
Creatinine, Ser: 2.09 mg/dL — ABNORMAL HIGH (ref 0.61–1.24)
GFR, Estimated: 35 mL/min — ABNORMAL LOW (ref >60)
Glucose, Bld: 159 mg/dL — ABNORMAL HIGH (ref 70–99)
Potassium: 4.1 mmol/L (ref 3.5–5.1)
Sodium: 133 mmol/L — ABNORMAL LOW (ref 135–145)

## 2023-10-09 LAB — CBC
HCT: 27.9 % — ABNORMAL LOW (ref 39.0–52.0)
Hemoglobin: 9 g/dL — ABNORMAL LOW (ref 13.0–17.0)
MCH: 25.2 pg — ABNORMAL LOW (ref 26.0–34.0)
MCHC: 32.3 g/dL (ref 30.0–36.0)
MCV: 78.2 fL — ABNORMAL LOW (ref 80.0–100.0)
Platelets: 197 10*3/uL (ref 150–400)
RBC: 3.57 MIL/uL — ABNORMAL LOW (ref 4.22–5.81)
RDW: 15.1 % (ref 11.5–15.5)
WBC: 19.6 10*3/uL — ABNORMAL HIGH (ref 4.0–10.5)
nRBC: 0 % (ref 0.0–0.2)

## 2023-10-09 LAB — URINALYSIS, W/ REFLEX TO CULTURE (INFECTION SUSPECTED)
Bacteria, UA: NONE SEEN
Bilirubin Urine: NEGATIVE
Glucose, UA: NEGATIVE mg/dL
Hgb urine dipstick: NEGATIVE
Ketones, ur: NEGATIVE mg/dL
Leukocytes,Ua: NEGATIVE
Nitrite: NEGATIVE
Protein, ur: NEGATIVE mg/dL
Specific Gravity, Urine: 1.012 (ref 1.005–1.030)
pH: 5 (ref 5.0–8.0)

## 2023-10-09 LAB — PROTIME-INR
INR: 1.6 — ABNORMAL HIGH (ref 0.8–1.2)
INR: 1.7 — ABNORMAL HIGH (ref 0.8–1.2)
Prothrombin Time: 18.9 s — ABNORMAL HIGH (ref 11.4–15.2)
Prothrombin Time: 20.5 s — ABNORMAL HIGH (ref 11.4–15.2)

## 2023-10-09 LAB — TROPONIN I (HIGH SENSITIVITY)
Troponin I (High Sensitivity): 16 ng/L (ref <18)
Troponin I (High Sensitivity): 17 ng/L (ref <18)

## 2023-10-09 LAB — PROCALCITONIN: Procalcitonin: 53.9 ng/mL

## 2023-10-09 LAB — LACTATE DEHYDROGENASE: LDH: 114 U/L (ref 98–192)

## 2023-10-09 LAB — BRAIN NATRIURETIC PEPTIDE: B Natriuretic Peptide: 248.5 pg/mL — ABNORMAL HIGH (ref 0.0–100.0)

## 2023-10-09 MED ORDER — PANTOPRAZOLE SODIUM 40 MG PO TBEC
40.0000 mg | DELAYED_RELEASE_TABLET | Freq: Every day | ORAL | Status: DC
  Administered 2023-10-09 – 2023-10-17 (×9): 40 mg via ORAL
  Filled 2023-10-09 (×9): qty 1

## 2023-10-09 MED ORDER — WARFARIN SODIUM 5 MG PO TABS
5.0000 mg | ORAL_TABLET | Freq: Once | ORAL | Status: AC
  Administered 2023-10-09: 5 mg via ORAL
  Filled 2023-10-09: qty 1

## 2023-10-09 MED ORDER — METOPROLOL SUCCINATE ER 25 MG PO TB24
25.0000 mg | ORAL_TABLET | Freq: Every day | ORAL | Status: DC
  Administered 2023-10-09 – 2023-10-17 (×9): 25 mg via ORAL
  Filled 2023-10-09 (×9): qty 1

## 2023-10-09 MED ORDER — ACETAMINOPHEN 325 MG PO TABS: 650.0000 mg | ORAL_TABLET | ORAL | Status: DC | PRN

## 2023-10-09 MED ORDER — HYDROMORPHONE HCL 1 MG/ML IJ SOLN
1.0000 mg | INTRAMUSCULAR | Status: DC | PRN
  Administered 2023-10-09 (×2): 1 mg via INTRAVENOUS
  Filled 2023-10-09 (×2): qty 1

## 2023-10-09 MED ORDER — INSULIN ASPART 100 UNIT/ML IJ SOLN: 0.0000 [IU] | Freq: Every day | INTRAMUSCULAR | Status: DC

## 2023-10-09 MED ORDER — DEXTROSE IN LACTATED RINGERS 5 % IV SOLN: INTRAVENOUS | Status: DC

## 2023-10-09 MED ORDER — INSULIN ASPART 100 UNIT/ML IJ SOLN
0.0000 [IU] | Freq: Three times a day (TID) | INTRAMUSCULAR | Status: DC
  Administered 2023-10-09: 3 [IU] via SUBCUTANEOUS
  Administered 2023-10-10: 5 [IU] via SUBCUTANEOUS
  Administered 2023-10-10 – 2023-10-11 (×4): 3 [IU] via SUBCUTANEOUS
  Administered 2023-10-11: 2 [IU] via SUBCUTANEOUS
  Administered 2023-10-12: 5 [IU] via SUBCUTANEOUS
  Administered 2023-10-12: 3 [IU] via SUBCUTANEOUS
  Administered 2023-10-12: 2 [IU] via SUBCUTANEOUS
  Administered 2023-10-13: 8 [IU] via SUBCUTANEOUS
  Administered 2023-10-13: 2 [IU] via SUBCUTANEOUS
  Administered 2023-10-13: 5 [IU] via SUBCUTANEOUS
  Administered 2023-10-14: 3 [IU] via SUBCUTANEOUS
  Administered 2023-10-14: 2 [IU] via SUBCUTANEOUS
  Administered 2023-10-14: 5 [IU] via SUBCUTANEOUS
  Administered 2023-10-15: 3 [IU] via SUBCUTANEOUS
  Administered 2023-10-15: 5 [IU] via SUBCUTANEOUS
  Administered 2023-10-15: 8 [IU] via SUBCUTANEOUS
  Administered 2023-10-16 – 2023-10-17 (×4): 3 [IU] via SUBCUTANEOUS
  Administered 2023-10-17: 5 [IU] via SUBCUTANEOUS

## 2023-10-09 MED ORDER — DAPTOMYCIN-SODIUM CHLORIDE 700-0.9 MG/100ML-% IV SOLN
700.0000 mg | Freq: Every day | INTRAVENOUS | Status: DC
  Administered 2023-10-09 – 2023-10-12 (×4): 700 mg via INTRAVENOUS
  Filled 2023-10-09 (×4): qty 100

## 2023-10-09 MED ORDER — IPRATROPIUM-ALBUTEROL 0.5-2.5 (3) MG/3ML IN SOLN: 3.0000 mL | Freq: Four times a day (QID) | RESPIRATORY_TRACT | Status: DC | PRN

## 2023-10-09 MED ORDER — IOHEXOL 350 MG/ML SOLN
75.0000 mL | Freq: Once | INTRAVENOUS | Status: AC | PRN
  Administered 2023-10-09: 75 mL via INTRAVENOUS

## 2023-10-09 MED ORDER — HYDROMORPHONE HCL ER 8 MG PO TB24
8.0000 mg | ORAL_TABLET | ORAL | Status: DC
  Administered 2023-10-09 – 2023-10-17 (×9): 8 mg via ORAL
  Filled 2023-10-09 (×9): qty 1

## 2023-10-09 MED ORDER — HYDROMORPHONE HCL 2 MG PO TABS
2.0000 mg | ORAL_TABLET | Freq: Three times a day (TID) | ORAL | Status: DC | PRN
  Administered 2023-10-09 – 2023-10-15 (×19): 2 mg via ORAL
  Filled 2023-10-09 (×19): qty 1

## 2023-10-09 MED ORDER — ONDANSETRON HCL 4 MG/2ML IJ SOLN: 4.0000 mg | Freq: Four times a day (QID) | INTRAMUSCULAR | Status: DC | PRN

## 2023-10-09 MED ORDER — SODIUM CHLORIDE 0.9 % IV SOLN
2.0000 g | Freq: Two times a day (BID) | INTRAVENOUS | Status: DC
  Administered 2023-10-09 – 2023-10-12 (×9): 2 g via INTRAVENOUS
  Filled 2023-10-09 (×9): qty 12.5

## 2023-10-09 MED ORDER — ADULT MULTIVITAMIN W/MINERALS CH
1.0000 | ORAL_TABLET | Freq: Every day | ORAL | Status: DC
  Administered 2023-10-09 – 2023-10-17 (×9): 1 via ORAL
  Filled 2023-10-09 (×9): qty 1

## 2023-10-09 MED ORDER — SERTRALINE HCL 25 MG PO TABS
50.0000 mg | ORAL_TABLET | Freq: Every day | ORAL | Status: DC
  Administered 2023-10-09 – 2023-10-16 (×8): 50 mg via ORAL
  Filled 2023-10-09 (×8): qty 2

## 2023-10-09 MED ORDER — FUROSEMIDE 10 MG/ML IJ SOLN
40.0000 mg | Freq: Once | INTRAMUSCULAR | Status: AC
Start: 2023-10-09 — End: 2023-10-09
  Administered 2023-10-09: 40 mg via INTRAVENOUS
  Filled 2023-10-09: qty 4

## 2023-10-09 MED ORDER — MINOCYCLINE HCL 50 MG PO CAPS
200.0000 mg | ORAL_CAPSULE | Freq: Two times a day (BID) | ORAL | Status: DC
  Administered 2023-10-09 – 2023-10-17 (×17): 200 mg via ORAL
  Filled 2023-10-09 (×17): qty 4

## 2023-10-09 MED ORDER — WARFARIN - PHARMACIST DOSING INPATIENT: Freq: Every day | Status: DC

## 2023-10-09 NOTE — Progress Notes (Signed)
LVAD Coordinator Rounding Note:  Admitted 10/09/23 to Heart Failure service from [facility] with worsening back pain and pt unable to eat or drink for 3-4 days.   CT abd/pelvis 10/09/23 1. No evidence of acute traumatic injury in the chest, abdomen, or pelvis. 2. LVAD is in unchanged in configuration compared with 09/08/2023. Similar thickening about the drive line. 3. Emphysema and chronic bronchitis. 4. Hepatic steatosis. 5. Nonobstructing left nephrolithiasis. 6. Large broad-based anterior abdominal wall hernia containing nonobstructed transverse colon and small bowel.   Pt is ill appearing lying in bed this morning. Pt tells me that he is having a difficult time swallowing and feels like he needs to take a deep breath often. He states that his back pain is almost unbearable at times. U/a ordered and maintenance fluids by Dr Maren Beach.  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 Blood cxs 10/31 pending.   Palliative care consult has been placed by provider team. His weight is down almost 20 lbs from his discharge weight on 10/24.  Vital signs: Temp: 97.6 HR: 85 Doppler Pressure: 90 Automatic BP: 118/100 (107) O2 Sat: 95% on RA Wt: 178 lbs  LVAD interrogation reveals:  Speed: 5600 Flow: 4.3 Power: 4.1 w PI: 2.9  Alarms: none Events: 60 PI events today Hct: 25  Fixed speed:  5600 Low speed limit:  5300  Drive Line: Existing VAD dressing removed and site care performed using sterile technique. Wound bed cleansed with VASHE solution. Skin surrounding wound bed cleaned with Chlora prep applicators x 2, allowed to dry. Followed by VASHE soaked 2x2 flush to exit site and then covered with several dry 4 x 4s. Drive line partially incorporated. Driveline tunnels approx 5 cm. Small amount of thick yellow/serosanguinous drainage noted. No redness, tenderness, foul odor or rash noted. Ulcerated area distal to drive line cleansed with VASHE solution.  VASHE moistened 2 x 2 placed over area and covered with gauze. Entire area covered with 2 large tegaderms. Drive line anchor reapplied on top of mepitel dressing to prevent skin breakdown. Continue daily wet to dry dressing changes using VASHE solution. Next dressing change due 10/10/23.     Pt had previous drive line debridement in OR with placement of instillation wound vac on 07/09/23 with Dr Donata Clay. This was changed to wet/dry on Friday 8/2. No further debridement of the VAD tunnel would be possible without injury to the outflow graft superiorly or the peritoneal cavity inferior to the power cord per Dr Donata Clay.   Labs:  LDH trend: 141  INR trend: 1.6  WBC trend: 15.4  Anticoagulation Plan: -INR Goal: 2.0 - 2.5 -ASA Dose: none  ICD: N/A  Infection:  10/09/23>> blood cxs>>  pending 10/09/23>>urine cx>> pending  Drips:   Plan/Recommendations:  Page VAD coordinator with equipment issues or driveline problems Daily drive line wet to dry dressing changes with VASHE solution per bedside RN.   Carlton Adam RN,BSN VAD Coordinator  Office: [phone]  24/7 Pager: [phone]

## 2023-10-09 NOTE — TOC Initial Note (Signed)
Transition of Care (TOC) - Initial/Assessment Note    Patient Details  Name: Kevin Underwood. MRN: [id number] Date of Birth: 07/30/1961  Transition of Care [facility]) CM/SW Contact:    Nicanor Bake Phone Number: [phone] 10/09/2023, 11:23 AM  Clinical Narrative:   HF CSW met with pt at bedside. Pt stated that he lives with spouse. Pt stated that he has no history of HH services. Although he would like to engage in services. Pt has a scale at home. Pt has support at home with VAD. CSW explained that a follow up hospital appointment will be scheduled with PCP closer to dc.  TOC will continue following.                 Expected Discharge Plan: Home/Self Care Barriers to Discharge: Continued Medical Work up   Patient Goals and CMS Choice Patient states their goals for this hospitalization and ongoing recovery are:: woule like HH services          Expected Discharge Plan and Services       Living arrangements for the past 2 months: Single Family Home                                      Prior Living Arrangements/Services Living arrangements for the past 2 months: Single Family Home Lives with:: Spouse Patient language and need for interpreter reviewed:: Yes Do you feel safe going back to the place where you live?: Yes      Need for Family Participation in Patient Care: No (Comment) Care giver support system in place?: Yes (comment)   Criminal Activity/Legal Involvement Pertinent to Current Situation/Hospitalization: No - Comment as needed  Activities of Daily Living   ADL Screening (condition at time of admission) Independently performs ADLs?: Yes (appropriate for developmental age) Does the patient have a NEW difficulty with bathing/dressing/toileting/self-feeding that is expected to last >3 days?: No Does the patient have a NEW difficulty with getting in/out of bed, walking, or climbing stairs that is expected to last >3 days?: No Does the patient  have a NEW difficulty with communication that is expected to last >3 days?: No Is the patient deaf or have difficulty hearing?: No Does the patient have difficulty seeing, even when wearing glasses/contacts?: No Does the patient have difficulty concentrating, remembering, or making decisions?: No  Permission Sought/Granted   Permission granted to share information with : Yes, Release of Information Signed  Share Information with NAME: Kevin Underwood           Emotional Assessment Appearance:: Appears older than stated age Attitude/Demeanor/Rapport: Engaged Affect (typically observed): Appropriate Orientation: : Oriented to Self, Oriented to Place, Oriented to  Time, Oriented to Situation Alcohol / Substance Use: Not Applicable Psych Involvement: No (comment)  Admission diagnosis:  Shortness of breath [R06.02] AKI (acute kidney injury) (HCC) [N17.9] LVAD (left ventricular assist device) present (HCC) [Z95.811] 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) [Q65.7XXA] Patient Active Problem List   Diagnosis Date Noted   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) 10/09/2023   Protein-calorie malnutrition, severe 10/02/2023   Abdominal pain 09/23/2023   Sepsis (HCC) 09/08/2023   Stimulant use disorder 02/09/2023   Depression 02/09/2023   Pain and swelling of right wrist 09/30/2022   Hypotension 08/07/2022   MRSA infection    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 (  LVAD) (HCC)    Left ventricular assist device (LVAD) complication, subsequent encounter 05/13/2022   AKI (acute kidney injury) (HCC) 05/13/2022   Hyperthyroidism 01/22/2022   Chronic systolic heart failure (HCC)    COPD (chronic obstructive pulmonary disease) (HCC) 09/09/2021   Malnutrition of moderate degree 11/17/2020   CHF (congestive heart failure), NYHA class IV, chronic, systolic (HCC) 11/17/2020   Palliative care by  specialist    Goals of care, counseling/discussion    Hyperlipidemia    CHF exacerbation (HCC) 09/23/2020   Unstable angina (HCC) 09/22/2020   LVAD (left ventricular assist device) present (HCC)    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   Cardiogenic shock (HCC)    Acute respiratory failure (HCC)    RUQ abdominal pain    Cardiomyopathy (HCC) 11/21/2019   Acute systolic CHF (congestive heart failure) (HCC) 11/21/2019   History of alcohol abuse Quit 6 mos ago 11/21/2019   History of recreational drug use 11/21/2019   PAF (paroxysmal atrial fibrillation) (HCC) 11/21/2019   Cardiac volume overload 11/21/2019   Elevated brain natriuretic peptide (BNP) level 11/21/2019   Leg edema, right 11/21/2019   Hyponatremia 11/21/2019   Abnormal electrocardiogram (ECG) (EKG) 11/21/2019   Hyperglycemia 11/21/2019   Tremulousness 11/21/2019   Restlessness and agitation 11/21/2019   OSA (obstructive sleep apnea) 11/21/2019   Atrial fibrillation with RVR (HCC) 11/20/2019   Acute congestive heart failure (HCC) 11/20/2019   Acute respiratory distress 11/20/2019   COPD with acute exacerbation (HCC)    Hypertension    Type 2 diabetes mellitus (HCC)    Closed fracture of 5th metacarpal 08/31/2012   PCP:  [name], FNP Pharmacy:   [id number] - [place], [place] - [address] AT [facility] [address] [place] [address] Phone: [phone] Fax: [phone]  [id number] - [place], [place] - [address] [place] [address] Phone: [phone] Fax: [phone]     Social Determinants of Health (SDOH) Social History: SDOH Screenings   Food Insecurity: No Food Insecurity (10/09/2023)  Housing: Low Risk  (10/09/2023)  Transportation Needs: No Transportation Needs (10/09/2023)  Recent Concern: Transportation Needs - Unmet Transportation Needs (09/08/2023)  Utilities: Not At Risk (10/09/2023)  Alcohol Screen: Low Risk  (11/10/2020)   Depression (PHQ2-9): Low Risk  (10/16/2022)  Financial Resource Strain: Medium Risk (05/17/2022)  Physical Activity: Inactive (11/10/2020)  Tobacco Use: Medium Risk (10/08/2023)   SDOH Interventions:     Readmission Risk Interventions    07/01/2022    9:05 AM  Readmission Risk Prevention Plan  Transportation Screening Complete  Medication Review (RN Care Manager) Complete  PCP or Specialist appointment within 3-5 days of discharge Complete  HRI or Home Care Consult Complete  SW Recovery Care/Counseling Consult Complete  Palliative Care Screening Not Applicable  Skilled Nursing Facility Not Applicable

## 2023-10-09 NOTE — ED Notes (Signed)
Pt returned from CT °

## 2023-10-09 NOTE — Progress Notes (Signed)
Subjective: Patient examined, images of CT scan chest and abdomen personally reviewed. 62 year old diabetic with COPD and status post HeartMate 3 implantation for ischemic cardiomyopathy 4 years ago admitted with lower back pain, difficulty swallowing, shortness of breath with low blood pressure and elevated white blood count.  He was hospitalized for similar issues earlier in the month.  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  He has been treated with multiple debridements and revisions of the power cord physician and has been on suppressive antibiotics for months.  With this presentation he denies significant abdominal pain, sternal pain, diarrhea, cough, or headache.  The patient has been admitted and rehydrated with IV fluids, started on broad-spectrum antibiotics including daptomycin and Maxipime, and continued on his Coumadin dosing.  Blood cultures are pending, LFTs are unremarkable, creatinine has elevated at 2.2, LDH is normal and bicarb is decreased at 17.  CT scan shows no abscess in the lower mediastinum or pump pocket with indurated tissue around the power cord at the exit site, unchanged.  His exam is fairly benign other than tenderness of his lower back at the hip level.  The VAD tunnel indolent infection is approximately 2 inches deep with scant drainage.  The wound is packed with Vashe wet-to-dry dressing on daily basis.   Objective: Vital signs in last 24 hours: Temp:  [97.5 F (36.4 C)-98.2 F (36.8 C)] 97.7 F (36.5 C) (10/31 1200) Pulse Rate:  [73-104] 87 (10/31 1200) Cardiac Rhythm: Normal sinus rhythm (10/31 0834) Resp:  [10-24] 18 (10/31 1200) BP: (73-135)/(44-100) 135/93 (10/31 1200) SpO2:  [95 %-99 %] 96 % (10/31 1200) Weight:  [77.1 kg-80.9 kg] 80.9 kg (10/31 0500)  Hemodynamic parameters for last 24 hours:  Sinus rhythm, afebrile  Intake/Output from previous day: 10/30 0701 - 10/31 0700 In: 112.9 [IV  Piggyback:112.9] Out: 240 [Urine:240] Intake/Output this shift: Total I/O In: 120 [P.O.:120] Out: 300 [Urine:300]  Exam Alert and oriented No focal neurodeficit Lungs with scattered rhonchi Normal sinus rhythm, normal VAD hum. No tenderness at the xiphoid area or around the power cord. Stable diastases/dilatation of the abdominal wall without tenderness or guarding Tenderness of the lower back on each side of the spine just above the hip level. Lab Results: Recent Labs    10/08/23 2348  WBC 15.4*  HGB 8.1*  HCT 24.8*  PLT 162   BMET:  Recent Labs    10/08/23 2348  NA 130*  K 4.1  CL 98  CO2 17*  GLUCOSE 132*  BUN 49*  CREATININE 2.22*  CALCIUM 7.8*    PT/INR:  Recent Labs    10/08/23 2348  LABPROT 18.9*  INR 1.6*   ABG    Component Value Date/Time   PHART 7.508 (H) 11/20/2020 0438   HCO3 24.8 09/09/2021 0313   TCO2 21 (L) 09/23/2023 1219   ACIDBASEDEF 6.0 (H) 11/24/2019 1718   O2SAT 63.4 09/09/2021 0313   CBG (last 3)  Recent Labs    10/09/23 0629  GLUCAP 118*    Assessment/Plan: S/P  Patient appears to have recurrent deep infection, possibly involving the lower spine and the extensive hardware present in that area from previous back fusion.  No way to accurately assess this with MRI imaging due to presence of VAD.  The persistent indolent infection of the proximal power cord tunnel is unchanged and cannot be further debrided due to the close proximity of the VAD outflow graft and the peritoneal cavity beneath  very thinned out abdominal wall.  Unfortunately with limited therapeutic options  to effectively treat/suppress ongoing infection, assessing goals of care and involvement with palliative care will need to be considered.   Lovett Sox 10/09/2023

## 2023-10-09 NOTE — Plan of Care (Signed)
  Problem: Education: Goal: Patient will understand all VAD equipment and how it functions Outcome: Progressing Goal: Patient will be able to verbalize current INR target range and antiplatelet therapy for discharge home Outcome: Progressing   Problem: Cardiac: Goal: LVAD will function as expected and patient will experience no clinical alarms Outcome: Progressing   Problem: Education: Goal: Knowledge of General Education information will improve Description: Including pain rating scale, medication(s)/side effects and non-pharmacologic comfort measures Outcome: Progressing   Problem: Pain Management: Goal: General experience of comfort will improve Outcome: Progressing   Problem: Safety: Goal: Ability to remain free from injury will improve Outcome: Progressing   Problem: Skin Integrity: Goal: Risk for impaired skin integrity will decrease Outcome: Progressing

## 2023-10-09 NOTE — H&P (Signed)
Advanced Heart Failure Team History and Physical Note   PCP:  [name], FNP  PCP-Cardiology: Nona Dell, MD     Reason for Admission: Shortness of Breath  HPI:    [name] Is a 62 y.o. 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 type 2 diabetes mellitus, COPD, and hypertension. 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  Has history of chronic driveline infections, most recently in July 2024. At that time, wound cultures grew MSSA and Pseudomonas. Completed long course of IV abx. Now on chronic suppressive PO antibiotics.   Admitted 09/08/23 for possible early sepsis. Was placed on IV antibiotics. CT abd/pelvis unchanged from previous. Blood cultures negative x2 days. CT surgery consulted. Driveline wound was improving. Pan to continue conservative wound care. Viral source felt to be the culprit. WBC trended down with improvement.   Most recently admitted from [facility] clinic 10/15 for severe abdominal pain at abdominal hernia and N/V with elevated lactic acid and WBC concerning for infection. CT A/P with no evidence of ischemic bowel, obstruction or worsening driveline infection. He was seen by TCTS and ID. Was treated with IV abx and discharge with minocycline and cipro. Stay was prolonged by uncontrolled that was improved at discharge with pain clinic follow up.  Presented to ED overnight for shortness of breath and productive cough with pleuritic chest pain. Labs on admission notable for WBC 15.4, hgb 8.1, Na 130, K 4.1, Cr 2.22, LDH 114, procal 53.9, blood cultures were obtained. CXR with unchanged cardiomegaly. CT Abd negative for acute findings. Started on IV cefepime and dapto and admitted for observation.  Home Medications Prior to Admission medications   Medication Sig Start Date End  Date Taking? Authorizing Provider  metoprolol succinate (TOPROL XL) 25 MG 24 hr tablet Take 1 tablet (25 mg total) by mouth daily. 08/16/23  Yes Andrey Farmer, PA-C  warfarin (COUMADIN) 4 MG tablet Take 1-1.5 tablets (4-6 mg total) by mouth See admin instructions. Take 1.5 tablets (6 mg) every Wednesday, Take 1 tablet (4 mg) all other days or as directed by Heart Failure clinic 10/02/23  Yes Alen Bleacher, NP  acetaminophen (TYLENOL) 325 MG tablet Take 2 tablets (650 mg total) by mouth every 4 (four) hours as needed for headache or mild pain. 02/20/23   Alen Bleacher, NP  albuterol (PROVENTIL) (2.5 MG/3ML) 0.083% nebulizer solution INHALE 3 ML BY NEBULIZATION EVERY 6 HOURS AS NEEDED FOR WHEEZING OR SHORTNESS OF BREATH Patient taking differently: Take 2.5 mg by nebulization every 6 (six) hours as needed for shortness of breath. 09/10/22   Laurey Morale, MD  albuterol (VENTOLIN HFA) 108 (90 Base) MCG/ACT inhaler Inhale 2 puffs into the lungs every 6 (six) hours as needed for wheezing or shortness of breath.    [provider]  allopurinol (ZYLOPRIM) 100 MG tablet Take 1 tablet (100 mg total) by mouth daily. 12/04/21   Laurey Morale, MD  amLODipine (NORVASC) 10 MG tablet Take 1 tablet (10 mg total) by mouth daily. 08/16/23   Andrey Farmer, PA-C  ciprofloxacin (CIPRO) 750 MG tablet Take 1 tablet (750 mg total) by mouth 2 (two) times daily. 09/10/23   Sabharwal, Aditya, DO  colchicine 0.6 MG tablet Take 1 tablet (0.6 mg total) by mouth daily as needed (gout pain). Patient taking differently:  Take 0.6 mg by mouth daily. 04/14/23   Laurey Morale, MD  COMBIVENT RESPIMAT 20-100 MCG/ACT AERS respimat Inhale 1 puff into the lungs every 6 (six) hours. 09/10/22   [provider]  fluticasone Aleda Grana ALLERGY RELIEF) 50 MCG/ACT nasal spray Place 2 sprays into both nostrils as needed for allergies or rhinitis.    [provider]  gabapentin (NEURONTIN) 300 MG capsule Take 1  capsule (300 mg total) by mouth 3 (three) times daily. 08/15/23   Andrey Farmer, PA-C  glipiZIDE (GLUCOTROL) 5 MG tablet Take 1 tablet (5 mg total) by mouth daily. 04/14/23 04/13/24  Laurey Morale, MD  HYDROmorphone (DILAUDID) 2 MG tablet Take 1 tablet (2 mg total) by mouth every 8 (eight) hours as needed for up to 10 days for severe pain (pain score 7-10). 10/02/23 10/12/23  Alen Bleacher, NP  HYDROmorphone HCl (EXALGO) 8 MG TB24 Take 1 tablet (8 mg total) by mouth daily. 10/03/23   Alen Bleacher, NP  lidocaine (LIDODERM) 5 % Place 1 patch onto the skin daily. Remove & Discard patch within 12 hours or as directed by MD 10/02/23   Alen Bleacher, NP  losartan (COZAAR) 50 MG tablet Take 1 tablet (50 mg total) by mouth daily. 08/16/23   Andrey Farmer, PA-C  Menthol, Topical Analgesic, (BIOFREEZE) 10 % CREA Apply 1 Application topically 2 (two) times daily as needed (Apply to all joints for pain).    [provider]  metFORMIN (GLUCOPHAGE) 1000 MG tablet Take 1 tablet (1,000 mg total) by mouth 2 (two) times daily. 04/14/23   Laurey Morale, MD  minocycline (MINOCIN) 100 MG capsule Take 2 capsules (200 mg total) by mouth 2 (two) times daily. 09/10/23   Sabharwal, Aditya, DO  mirtazapine (REMERON) 15 MG tablet Take 1 tablet (15 mg total) by mouth at bedtime. 04/14/23   Laurey Morale, MD  Multiple Vitamin (MULTIVITAMIN WITH MINERALS) TABS tablet Take 1 tablet by mouth daily. 02/20/23   Alen Bleacher, NP  pantoprazole (PROTONIX) 40 MG tablet Take 1 tablet (40 mg total) by mouth daily. 04/14/23   Laurey Morale, MD  rosuvastatin (CRESTOR) 20 MG tablet Take 1 tablet (20 mg total) by mouth daily. 08/15/23   Andrey Farmer, PA-C  senna-docusate (SENOKOT-S) 8.6-50 MG tablet Take 2 tablets by mouth at bedtime. 10/02/23   Alen Bleacher, NP  sertraline (ZOLOFT) 50 MG tablet Take 1 tablet (50 mg total) by mouth daily. Patient taking differently: Take 50 mg by mouth at bedtime. 04/14/23   Laurey Morale, MD  spironolactone (ALDACTONE) 25 MG tablet Take 1 tablet (25 mg total) by mouth daily. 10/02/23   Alen Bleacher, NP  tamsulosin (FLOMAX) 0.4 MG CAPS capsule Take 1 capsule (0.4 mg total) by mouth daily. 04/14/23   Laurey Morale, MD  thiamine (VITAMIN B-1) 100 MG tablet Take 1 tablet (100 mg total) by mouth daily. 02/20/23   Alen Bleacher, NP  traZODone (DESYREL) 150 MG tablet Take 1 tablet (150 mg total) by mouth at bedtime. 03/11/22   Laurey Morale, MD  TRELEGY ELLIPTA 100-62.5-25 MCG/ACT AEPB Inhale 1 puff into the lungs daily. 04/14/23   Laurey Morale, MD    Past Medical History: Past Medical History:  Diagnosis Date   "    Arthritis    CAD (coronary artery disease)    a. s/p CABG in 11/2019 with LIMA-LAD, SVG-OM1, SVG-PDA and SVG-D1   CHF (congestive  heart failure) (HCC)    a. EF < 20% by echo in 11/2019   Essential hypertension    PAF (paroxysmal atrial fibrillation) (HCC)    Type 2 diabetes mellitus (HCC)     Past Surgical History: Past Surgical History:  Procedure Laterality Date   APPLICATION OF WOUND VAC Left 06/14/2022   Procedure: APPLICATION OF WOUND VAC;  Surgeon: Lovett Sox, MD;  Location: [facility];  Service: Thoracic;  Laterality: Left;   APPLICATION OF WOUND VAC N/A 06/20/2022   Procedure: VAD POWERCORD TUNNEL WOUND VAC CHANGE;  Surgeon: Lovett Sox, MD;  Location: [facility];  Service: Thoracic;  Laterality: N/A;   APPLICATION OF WOUND VAC N/A 06/27/2022   Procedure: WOUND VAC CHANGE;  Surgeon: Lovett Sox, MD;  Location: [facility];  Service: Thoracic;  Laterality: N/A;   APPLICATION OF WOUND VAC N/A 07/08/2022   Procedure: WOUND VAC CHANGE;  Surgeon: Lovett Sox, MD;  Location: [facility];  Service: Thoracic;  Laterality: N/A;   APPLICATION OF WOUND VAC N/A 01/28/2023   Procedure: APPLICATION OF WOUND VAC;  Surgeon: Lovett Sox, MD;  [facility]: [facility];  Service: Thoracic;  Laterality: N/A;   APPLICATION OF WOUND VAC N/A 02/05/2023   Procedure: WOUND VAC CHANGE;   Surgeon: Lovett Sox, MD;  [facility]: [facility];  Service: Thoracic;  Laterality: N/A;   APPLICATION OF WOUND VAC N/A 02/12/2023   Procedure: REMOVAL OF WOUND VAC DRESSING;  Surgeon: Lovett Sox, MD;  [facility]: [facility];  Service: Thoracic;  Laterality: N/A;   APPLICATION OF WOUND VAC N/A 07/09/2023   Procedure: APPLICATION OF WOUND VAC;  Surgeon: Lovett Sox, MD;  [facility]: [facility];  Service: Thoracic;  Laterality: N/A;   BACK SURGERY     CLIPPING OF ATRIAL APPENDAGE N/A 11/26/2019   Procedure: Clipping Of Atrial Appendage using AtriCure 40 Clip;  Surgeon: [name], MD;  Location: [facility];  Service: Open Heart Surgery;  Laterality: N/A;   CORONARY ARTERY BYPASS GRAFT N/A 11/26/2019   Procedure: CORONARY ARTERY BYPASS GRAFTING (CABG) using endoscopic greater saphenous vein harvest: svc to OM; svc to Diag; svc to PD; and LIMA to LAD.;  Surgeon: [name], MD;  Location: [facility];  Service: Open Heart Surgery;  Laterality: N/A;   FOOT SURGERY     HAND SURGERY     INSERTION OF IMPLANTABLE LEFT VENTRICULAR ASSIST DEVICE N/A 11/17/2020   Procedure: INSERTION OF IMPLANTABLE LEFT VENTRICULAR ASSIST DEVICE - HM3;  Surgeon: [name], MD;  [facility]: [facility];  Service: Open Heart Surgery;  Laterality: N/A;   INTRAOPERATIVE TRANSESOPHAGEAL ECHOCARDIOGRAM  12/04/2019   Procedure: Intraoperative Transesophageal Echocardiogram;  Surgeon: [name], MD;  [facility]: [facility];  Service: Open Heart Surgery;;   MAZE N/A 11/26/2019   Procedure: MAZE using Bilateral Pulmonary Vein isolation.;  Surgeon: [name], MD;  [facility];  Service: Open Heart Surgery;  Laterality: N/A;   MULTIPLE EXTRACTIONS WITH ALVEOLOPLASTY N/A 11/16/2020   Procedure: MULTIPLE EXTRACTION WITH ALVEOLOPLASTY;  Surgeon: Sharman Cheek, DMD;  [facility];  Service: Dentistry;  Laterality: N/A;   PLACEMENT OF IMPELLA LEFT VENTRICULAR ASSIST DEVICE N/A 11/24/2019   Procedure: PLACEMENT OF IMPELLA 5.5 LEFT  VENTRICULAR ASSIST DEVICE;  Surgeon: [name], MD;  [facility]: [facility];  Service: Open Heart Surgery;  Laterality: N/A;   PLACEMENT OF IMPELLA LEFT VENTRICULAR ASSIST DEVICE Left 11/10/2020   Procedure: PLACEMENT OF IMPELLA 5.5 LEFT VENTRICULAR ASSIST DEVICE VIA  LEFT AVILLARY ARTERY;  Surgeon: Vickey Sages,  Merri Brunette, MD;  Location: [facility];  Service: Open Heart Surgery;  Laterality: Left;   REMOVAL OF IMPELLA LEFT VENTRICULAR ASSIST DEVICE Right 12/04/2019   Procedure: REMOVAL OF IMPELLA LEFT VENTRICULAR ASSIST DEVICE, right axilla;  Surgeon: [name], MD;  Location: [facility] OR;  Service: Open Heart Surgery;  Laterality: Right;   RIGHT/LEFT HEART CATH AND CORONARY ANGIOGRAPHY N/A 11/24/2019   Procedure: RIGHT/LEFT HEART CATH AND CORONARY ANGIOGRAPHY;  Surgeon: Laurey Morale, MD;  [facility] INVASIVE CV LAB;  Service: Cardiovascular;  Laterality: N/A;   RIGHT/LEFT HEART CATH AND CORONARY/GRAFT ANGIOGRAPHY N/A 11/09/2020   Procedure: RIGHT/LEFT HEART CATH AND CORONARY/GRAFT ANGIOGRAPHY;  Surgeon: [name], MD;  Location: [facility] INVASIVE CV [facility];  Service: Cardiovascular;  Laterality: N/A;   STERNAL WOUND DEBRIDEMENT N/A 05/16/2022   Procedure: VAD DRIVELINE DEBRIDEMENT;  Surgeon: Lovett Sox, MD;  [facility]: [facility];  Service: Thoracic;  Laterality: N/A;  VANC PULSE LAVAGE   STERNAL WOUND DEBRIDEMENT Left 06/14/2022   Procedure: DEBRIDEMENT OF VAD POWERCORD TUNNEL;  Surgeon: Lovett Sox, MD;  [facility]: [facility];  Service: Thoracic;  Laterality: Left;   STERNAL WOUND DEBRIDEMENT N/A 06/27/2022   Procedure: ABDOMINAL WOUND DEBRIDEMENT, APPLICATION OF MYRIAD;  Surgeon: Lovett Sox, MD;  [facility];  Service: Thoracic;  Laterality: N/A;   STERNAL WOUND DEBRIDEMENT N/A 07/08/2022   Procedure: ABDOMINAL WOUND DEBRIDEMENT;  Surgeon: Lovett Sox, MD;  [facility];  Service: Thoracic;  Laterality: N/A;   STERNAL WOUND DEBRIDEMENT N/A 01/28/2023   Procedure: ABDOMINAL DRIVELINE WOUND  DEBRIDEMENT;  Surgeon: Lovett Sox, MD;  [facility]: [facility];  Service: Thoracic;  Laterality: N/A;   STERNAL WOUND DEBRIDEMENT N/A 02/05/2023   Procedure: ABDOMINAL DRIVELINE WOUND DEBRIDEMENT;  Surgeon: Lovett Sox, MD;  [facility];  Service: Thoracic;  Laterality: N/A;   STERNAL WOUND DEBRIDEMENT N/A 02/12/2023   Procedure: ABDOMINAL DRIVELINE WOUND DEBRIDEMENT AND APPLICATION OF MATRIX WOUND DRESSING;  Surgeon: Lovett Sox, MD;  [facility];  Service: Thoracic;  Laterality: N/A;   STERNAL WOUND DEBRIDEMENT N/A 07/09/2023   Procedure: DRIVELINE WOUND DEBRIDEMENT;  Surgeon: Lovett Sox, MD;  [facility];  Service: Thoracic;  Laterality: N/A;   STERNAL WOUND DEBRIDEMENT N/A 07/17/2023   Procedure: DRIVELINE WOUND DEBRIDEMENT;  Surgeon: Lovett Sox, MD;  [facility];  Service: Thoracic;  Laterality: N/A;   TEE WITHOUT CARDIOVERSION N/A 11/24/2019   Procedure: TRANSESOPHAGEAL ECHOCARDIOGRAM (TEE);  Surgeon: [name], MD;  Location: [facility] OR;  Service: Open Heart Surgery;  Laterality: N/A;   TEE WITHOUT CARDIOVERSION N/A 11/26/2019   Procedure: TRANSESOPHAGEAL ECHOCARDIOGRAM (TEE);  Surgeon: [name], MD;  Location: [facility] OR;  Service: Open Heart Surgery;  Laterality: N/A;   TEE WITHOUT CARDIOVERSION N/A 11/10/2020   Procedure: TRANSESOPHAGEAL ECHOCARDIOGRAM (TEE);  Surgeon: [name], MD;  Location: [facility] OR;  Service: Open Heart Surgery;  Laterality: N/A;   TEE WITHOUT CARDIOVERSION N/A 11/17/2020   Procedure: TRANSESOPHAGEAL ECHOCARDIOGRAM (TEE);  Surgeon: [name], MD;  Location: [facility] OR;  Service: Open Heart Surgery;  Laterality: N/A;   WOUND EXPLORATION N/A 05/31/2022   Procedure: WOUND EXPLORATION, Debridement and Irrigation of abdominal wound;  Surgeon: Lovett Sox, MD;  [facility]: [facility];  Service: Thoracic;  Laterality: N/A;    Family History:  Family History  Problem Relation Age of Onset   Heart disease Sister        Tumor?    Arthritis Other    Lung disease Other    Asthma Other  Diabetes Other    Thyroid disease Neg Hx     Social History: Social History   Socioeconomic History   Marital status: Married    Spouse name: Not on file   Number of children: Not on file   Years of education: 12   Highest education level: Not on file  Occupational History   Not on file  Tobacco Use   Smoking status: Former    Current packs/day: 0.00    Average packs/day: 1 pack/day for 30.0 years (30.0 ttl pk-yrs)    Types: Cigarettes    Start date: 12/19/1989    Quit date: 12/20/2019    Years since quitting: 3.8   Smokeless tobacco: Never  Vaping Use   Vaping status: Never Used  Substance and Sexual Activity   Alcohol use: Not Currently    Comment: Prior history of excessive intake   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 He denies any issues with walking or talking as a child. He completed the 9th grade. He is currently financially supported through disability. He is married and he has four children he is estranged from. He is currently living with his wife.    Social Determinants of Health   Financial Resource Strain: Medium Risk (05/17/2022)   Overall Financial Resource Strain (CARDIA)    Difficulty of Paying Living Expenses: Somewhat hard  Food Insecurity: No Food Insecurity (10/09/2023)   Hunger Vital Sign    Worried About Running Out of Food in the Last Year: Never true    Ran Out of Food in the Last Year: Never true  Transportation Needs: No Transportation Needs (10/09/2023)   PRAPARE - Administrator, Civil Service (Medical): No    Lack of Transportation (Non-Medical): No  Recent Concern: Transportation Needs - Unmet Transportation Needs (09/08/2023)   PRAPARE - Transportation    Lack of Transportation (Medical): Yes    Lack of  Transportation (Non-Medical): Yes  Physical Activity: Inactive (11/10/2020)   Exercise Vital Sign    Days of Exercise per Week: 0 days    Minutes of Exercise per Session: 0 min  Stress: Not on file  Social Connections: Not on file    Allergies:  Allergies  Allergen Reactions   Firvanq [Vancomycin] Nausea And Vomiting and Other (See Comments)    Arthralgias     Objective:    Vital Signs:   Temp:  [97.7 F (36.5 C)-98.2 F (36.8 C)] 98.2 F (36.8 C) (10/31 0247) Pulse Rate:  [73-104] 73 (10/31 0247) Resp:  [10-24] 14 (10/31 0500) BP: (73-114)/(44-84) 114/73 (10/31 0247) SpO2:  [96 %-99 %] 97 % (10/31 0247) Weight:  [77.1 kg-80.9 kg] 80.9 kg (10/31 0500)   Filed Weights   10/08/23 2343 10/09/23 0500  Weight: 77.1 kg 80.9 kg     Physical Exam     General:  Chronic appearing. No respiratory difficulty on ra HEENT: Normal Neck: Supple. no JVP 9. Carotids 2+ bilat; no bruits. No lymphadenopathy or thyromegaly appreciated. Cor: PMI nondisplaced. Regular rate & rhythm. No rubs, gallops or murmurs. Lungs: Clear Abdomen: Soft, tender. + hernia . No hepatosplenomegaly. No bruits or masses. Hypoactive bowel sounds. Extremities: No cyanosis, clubbing, rash, edema Neuro: Alert & oriented x 3, cranial nerves grossly intact. moves all 4 extremities w/o difficulty. Affect pleasant.  Telemetry  SR in 80s  EKG   N/a  Labs    Basic Metabolic Panel: Recent Labs  Lab 10/08/23 2348  NA 130*  K 4.1  CL 98  CO2 17*  GLUCOSE 132*  BUN 49*  CREATININE 2.22*  CALCIUM 7.8*    Liver Function Tests: Recent Labs  Lab 10/08/23 2348  AST 33  ALT 31  ALKPHOS 135*  BILITOT 0.4  PROT 5.1*  ALBUMIN 1.9*   No results for input(s): "LIPASE", "AMYLASE" in the last 168 hours. No results for input(s): "AMMONIA" in the last 168 hours.  CBC: Recent Labs  Lab 10/08/23 2348  WBC 15.4*  NEUTROABS 12.9*  HGB 8.1*  HCT 24.8*  MCV 78.0*  PLT 162    Cardiac Enzymes: No  results for input(s): "CKTOTAL", "CKMB", "CKMBINDEX", "TROPONINI" in the last 168 hours.  BNP: BNP (last 3 results) Recent Labs    09/08/23 1940 10/08/23 2348  BNP 57.1 248.5*    ProBNP (last 3 results) No results for input(s): "PROBNP" in the last 8760 hours.   CBG: Recent Labs  Lab 10/02/23 1118 10/09/23 0629  GLUCAP 137* 118*    Coagulation Studies: Recent Labs    10/08/23 2348  LABPROT 18.9*  INR 1.6*    Imaging: CT CHEST ABDOMEN PELVIS W CONTRAST  Result Date: 10/09/2023 CLINICAL DATA:  LVAD patient.  Blunt polytrauma. EXAM: CT CHEST, ABDOMEN, AND PELVIS WITH CONTRAST TECHNIQUE: Multidetector CT imaging of the chest, abdomen and pelvis was performed following the standard protocol during bolus administration of intravenous contrast. RADIATION DOSE REDUCTION: This exam was performed according to the departmental dose-optimization program which includes automated exposure control, adjustment of the mA and/or kV according to patient size and/or use of iterative reconstruction technique. CONTRAST:  75mL OMNIPAQUE IOHEXOL 350 MG/ML SOLN COMPARISON:  Chest radiograph 10/08/2023; CT abdomen and pelvis 09/23/2023 and CT chest abdomen and pelvis 09/08/2023 FINDINGS: CT CHEST FINDINGS Cardiovascular: LVAD is in unchanged configuration compared with 09/08/2023. Similar soft tissue thickening about the drive line. No fluid collection. No pericardial effusion. Coronary artery and aortic atherosclerotic calcification. No evidence of acute aortic injury. Left atrial appendage occlusion device. Mediastinum/Nodes: Trachea and esophagus are unremarkable. No mediastinal hematoma. Lungs/Pleura: Emphysema. Bronchial wall thickening greatest in the lower lobes. Bibasilar atelectasis/scarring. No pneumothorax. Musculoskeletal: Sternotomy.  No acute fracture. CT ABDOMEN PELVIS FINDINGS Hepatobiliary: Hepatic steatosis. Unremarkable gallbladder and biliary tree. Pancreas: Fatty atrophy without acute  abnormality. Spleen: Unremarkable. Adrenals/Urinary Tract: Normal adrenal glands. Streak artifact obscures a portion of the mid right ureter. No hydronephrosis. Nonobstructing left nephrolithiasis. Unremarkable bladder. Stomach/Bowel: Normal caliber large and small bowel. No bowel wall thickening. The appendix is not visualized. The transverse colon and a loop of small bowel herniate into the large anterior abdominal wall hernia. Vascular/Lymphatic: Aortic atherosclerosis. No enlarged abdominal or pelvic lymph nodes. Reproductive: Unremarkable. Other: No free intraperitoneal fluid or air. Musculoskeletal: Posterior fusion L4-S1.  No acute fracture. IMPRESSION: 1. No evidence of acute traumatic injury in the chest, abdomen, or pelvis. 2. LVAD is in unchanged in configuration compared with 09/08/2023. Similar thickening about the drive line. 3. Emphysema and chronic bronchitis. 4. Hepatic steatosis. 5. Nonobstructing left nephrolithiasis. 6. Large broad-based anterior abdominal wall hernia containing nonobstructed transverse colon and small bowel. Aortic Atherosclerosis (ICD10-I70.0) and Emphysema (ICD10-J43.9). Electronically Signed   By: Minerva Fester M.D.   On: 10/09/2023 03:12   DG Chest Portable 1 View  Result Date: 10/09/2023 CLINICAL DATA:  Shortness of breath EXAM: PORTABLE CHEST 1 VIEW COMPARISON:  Chest x-ray 09/23/2023 FINDINGS: LVAD again noted. Sternotomy wires are present. The heart is enlarged, unchanged. There is no focal lung infiltrate, pleural effusion or pneumothorax. There surgical clips in the bilateral axillary regions. There is a healed left clavicular fracture. No acute fractures are seen. IMPRESSION: 1. No active disease. 2. Cardiomegaly. Electronically Signed   By: Darliss Cheney M.D.   On: 10/09/2023 01:02     Patient Profile   Kevin Underwood. Is a 62 y.o. 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 type 2 diabetes mellitus, COPD, and hypertension. 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 Presenting with back pain.  Assessment/Plan   1. 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  Cultures have grown acinetobacter, MRSA and pseudomonas He was readmitted with driveline infection in 2/24, completed course of daptomycin for MRSA. Discharged from the hospital on 09/04/2023 with recurrent driveline infection growing Pseudomonas. On chronic minocycline and ciprofloxacin at home. Wound culture 10/1 grew MRSA. Reports compliance with home regimen. Driveline appeared stable on CT A/P this admit. Suspect persistent indolent driveline infection. Given frequency of admission with pressentation of elevated WBC and infectious flair, concerned about abx adherence in the outpatient. - WBC 15.4, procal 53 - Start Cefepime and Dapto per ID  2. Abdominal pain with n/v:  Associated with nausea and vomiting. CT A/P 10/15 showed possible mild ileus or enteritis, no evidence of ischemic bowel, obstruction, or VAD tunnel abscess.  Lipase and LFTs negative. Lactic acid elevated at 3.6 on admit but cleared down to 1.5. WBCs down w/ IV abx, 26>>10k-> 9k. Urine culture and BC NGTD.  Has been seen by TCTS and ID.  Suspect presentation is due to ileus + acute on chronic driveline infection. Has pain clinic appointment 11/20. - On 8mg  daily exalgo, 2 mg PRN dilaudid Q6, +lidocaine patch - Continue home pain regimen. Adjust as needed.  3.  Chronic systolic CHF s/p Heartmate 3 LVAD on 11/17/20. Echo this admit LVAD cannula in apex. Interventricular septum is midline. LVEF 20-25%, RV moderately reduced.  Received IVF on admit due to poor po intake with ileus. - INR goal 2 - 2.5. INR 1.6. Warfarin dosing per PharmD. -  Appears volume overloaded. Lasix IV 40mg   - Hold Elberta Fortis today with AKI, repeat BMP today - Resume home atorvastatin and metoprolol XL   4. 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  AKI in setting of infection and volume overload - diuresis as above - Hold GDMT  - Repeat BMP today  5.  CAD S/p CABG 12/20 - Continue Crestor 20 mg daily.   6.  Paroxysmal atrial fibrillation status post maze and left atrial appendage clip. NSR this admission. - Off amiodarone with hyperthyroidism.  7. Type 2 diabetes -SSI  8. Hx methamphetamine abuse:  - no reported use  9. Hx Hyperthyroidism:  Likely related to amiodarone. He is now off amiodarone and methimazole.   10. Anemia (chronic blood loss):   - Hgb 8.1 - Trend CBC   11. COPD: Prior smoker.  - On trelegy at home  12. Ventral hernia: Large ventral hernia, soft and nontender.  Does not appear incarcerated.  No bowel obstruction on 09/23/23 abdominal CT.  Not candidate  for surgery due to proximity of hernia to driveline tunnel. Intolerant abdominal binder.    Anticipate discharge home tomorrow.  [place] Shlok Raz, NP 10/09/2023, 7:03 AM  Advanced Heart Failure Team Pager [phone] (M-F; 7a - 5p)  Please contact [facility] for night-coverage after hours (4p -7a ) and weekends on [email or web address]

## 2023-10-09 NOTE — Consult Note (Signed)
Consultation Note Date: 10/09/2023   Patient Name: Kevin Underwood.  DOB: 09-09-1961  MRN: [id number]  Age / Sex: 62 y.o., male  PCP: [name], FNP Referring Physician: Dorthula Nettles, DO  Reason for Consultation: Establishing goals of care and Psychosocial/spiritual support  HPI/Patient Profile: 62 y.o. 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 type 2 diabetes mellitus, COPD, and hypertension. 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   Has history of chronic driveline infections, most recently in July 2024. At that time, wound cultures grew MSSA and Pseudomonas. Completed long course of IV abx. Now on chronic suppressive PO antibiotics.    Admitted 09/08/23 for possible early sepsis. Was placed on IV antibiotics. CT abd/pelvis unchanged from previous. Blood cultures negative x2 days. CT surgery consulted. Driveline wound was improving. Pan to continue conservative wound care. Viral source felt to be the culprit. WBC trended down with improvement.    Most recently admitted from VAD clinic 10/15 for severe abdominal pain at abdominal hernia and N/V with elevated lactic acid and WBC concerning for infection. CT A/P with no evidence of ischemic bowel, obstruction or worsening driveline infection. He was seen by TCTS and ID. Was treated with IV abx and discharge with minocycline and cipro.     Presented to ED overnight for shortness of breath and productive cough with pleuritic chest pain. Labs on admission notable for WBC 15.4, hgb 8.1, Na 130, K 4.1, Cr 2.22, LDH 114, procal 53.9, blood cultures were obtained. CXR with unchanged cardiomegaly. CT Abd negative for acute findings. Started on IV  cefepime and dapto and admitted for observation.  Patient faces treatment option decisions, advanced directive decisions and anticipatory care needs.   Clinical Assessment and Goals of Care:  This NP Lorinda Creed reviewed medical records, received report from team, assessed the patient and then meet at the patient's bedside  to discuss diagnosis, prognosis, GOC, EOL wishes disposition and options.   Concept of Palliative Care was introduced as specialized medical care for people and their families living with serious illness.  If focuses on providing relief from the symptoms and stress of a serious illness.  The goal is to improve quality of life for both the patient and the family.   Values and goals of care important to patient and family were attempted to be elicited.  Created space and opportunity for patient   to explore thoughts and feelings regarding current medical situation.    Patient lives at home with his wife who is his main caregiver and an adult son.  His son helps out at home doing yard work, but he worries about his wife "needing some help at home"  He verbalizes an understanding of the seriousness of his current medical situation, he speaks to "its one thing after another, I cannot get rid of  the infection".   He verbalizes frustration and disappointment with his present state of health.  Education offered on the difference between a full medical support path and a palliative comfort path, focusing on comfort and dignity, allowing for natural death.  At this time patient wishes to continue to treat the treatable, hope for improvement, and wishes for continued life  Education offered on hospice benefit; philosophy and eligibility   A  discussion was had today regarding advanced directives.    Concepts specific to code status, artifical feeding and hydration, continued IV antibiotics and rehospitalization was had.   Patient clearly verbalizes no desire for intubation or  cardioversion.   Discussed with heart failure team   MOST form introduced and Hard Choices left for review.    Plan of care: -DO NOT INTUBATE, no cardioversion -Continue to treat the treatable and patient hopes to "maintain things", he is hopeful for ongoing life -However if patient gets to a place where quality of life is unacceptable he hopes for comfort and dignity at that time allowing for natural death at home      (Patient has living will in EMR) -Hopeful to discharge home when medically stable    Questions and concerns addressed.  Patient  encouraged to call with questions or concerns.     PMT will continue to support holistically.             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 The following aspects are pertinent.  Past Medical History:  Diagnosis Date   "    Arthritis    CAD (coronary artery disease)    a. s/p CABG in 11/2019 with LIMA-LAD, SVG-OM1, SVG-PDA and SVG-D1   CHF (congestive heart failure) (HCC)    a. EF < 20% by echo in 11/2019   Essential hypertension    PAF (paroxysmal atrial fibrillation) (HCC)    Type 2 diabetes mellitus (HCC)    Social History   Socioeconomic History   Marital status: Married    Spouse name: Not on file   Number of children: Not on file   Years of education: 12   Highest education level: Not on file  Occupational History   Not on file  Tobacco Use   Smoking status: Former     Current packs/day: 0.00    Average packs/day: 1 pack/day for 30.0 years (30.0 ttl pk-yrs)    Types: Cigarettes    Start date: 12/19/1989    Quit date: 12/20/2019    Years since quitting: 3.8   Smokeless tobacco: Never  Vaping Use   Vaping status: Never Used  Substance and Sexual Activity   Alcohol use: Not Currently    Comment: Prior history of excessive intake   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 He denies any issues with walking or talking as  a child. He completed the 9th grade. He is currently financially supported through disability. He is married and he has four children he is estranged from. He is currently living with his wife.    Social Determinants of Health   Financial Resource Strain: Medium Risk (05/17/2022)   Overall Financial Resource Strain (CARDIA)    Difficulty of Paying Living Expenses: Somewhat hard  Food Insecurity: No Food Insecurity (10/09/2023)   Hunger Vital Sign    Worried About Running Out of Food in the Last Year: Never true    Ran Out of Food in the Last Year: Never true  Transportation Needs: No Transportation Needs (10/09/2023)   PRAPARE - Administrator, Civil Service (Medical): No    Lack of Transportation (Non-Medical): No  Recent Concern: Transportation Needs - Unmet Transportation Needs (09/08/2023)   PRAPARE - Transportation    Lack of Transportation (Medical): Yes    Lack of Transportation (Non-Medical): Yes  Physical Activity: Inactive (11/10/2020)   Exercise Vital Sign    Days of Exercise per Week: 0 days    Minutes of Exercise per Session: 0 min  Stress: Not on file  Social Connections: Not on file   Family History  Problem Relation Age of Onset   Heart disease Sister        Tumor?   Arthritis Other    Lung disease Other    Asthma Other    Diabetes Other    Thyroid  disease Neg Hx    Scheduled Meds:  HYDROmorphone HCl  8 mg Oral Q24H   minocycline  200 mg Oral BID   Continuous Infusions:  ceFEPime (MAXIPIME) IV 2 g (10/09/23 0957)   DAPTOmycin 700 mg (10/09/23 0456)   dextrose 5% lactated ringers     PRN Meds:.acetaminophen, HYDROmorphone, ondansetron (ZOFRAN) IV Medications Prior to Admission:  Prior to Admission medications   Medication Sig Start Date End Date Taking? Authorizing Provider  metoprolol succinate (TOPROL XL) 25 MG 24 hr tablet Take 1 tablet (25 mg total) by mouth daily. 08/16/23  Yes Andrey Farmer, PA-C  warfarin (COUMADIN) 4 MG tablet Take 1-1.5 tablets (4-6 mg total) by mouth See admin instructions. Take 1.5 tablets (6 mg) every Wednesday, Take 1 tablet (4 mg) all other days or as directed by Heart Failure clinic 10/02/23  Yes Alen Bleacher, NP  acetaminophen (TYLENOL) 325 MG tablet Take 2 tablets (650 mg total) by mouth every 4 (four) hours as needed for headache or mild pain. 02/20/23   Alen Bleacher, NP  albuterol (PROVENTIL) (2.5 MG/3ML) 0.083% nebulizer solution INHALE 3 ML BY NEBULIZATION EVERY 6 HOURS AS NEEDED FOR WHEEZING OR SHORTNESS OF BREATH Patient taking differently: Take 2.5 mg by nebulization every 6 (six) hours as needed for shortness of breath. 09/10/22   Laurey Morale, MD  albuterol (VENTOLIN HFA) 108 (90 Base) MCG/ACT inhaler Inhale 2 puffs into the lungs every 6 (six) hours as needed for wheezing or shortness of breath.    [provider]  allopurinol (ZYLOPRIM) 100 MG tablet Take 1 tablet (100 mg total) by mouth daily. 12/04/21   Laurey Morale, MD  amLODipine (NORVASC) 10 MG tablet Take 1 tablet (10 mg total) by mouth daily. 08/16/23   Andrey Farmer, PA-C  ciprofloxacin (CIPRO) 750 MG tablet Take 1 tablet (750 mg total) by mouth 2 (two) times daily. 09/10/23   Sabharwal, Aditya, DO  colchicine 0.6 MG tablet Take 1 tablet (0.6 mg total) by mouth daily  as needed (gout pain). Patient taking  differently: Take 0.6 mg by mouth daily. 04/14/23   Laurey Morale, MD  COMBIVENT RESPIMAT 20-100 MCG/ACT AERS respimat Inhale 1 puff into the lungs every 6 (six) hours. 09/10/22   [provider]  fluticasone Aleda Grana ALLERGY RELIEF) 50 MCG/ACT nasal spray Place 2 sprays into both nostrils as needed for allergies or rhinitis.    [provider]  gabapentin (NEURONTIN) 300 MG capsule Take 1 capsule (300 mg total) by mouth 3 (three) times daily. 08/15/23   Andrey Farmer, PA-C  glipiZIDE (GLUCOTROL) 5 MG tablet Take 1 tablet (5 mg total) by mouth daily. 04/14/23 04/13/24  Laurey Morale, MD  HYDROmorphone (DILAUDID) 2 MG tablet Take 1 tablet (2 mg total) by mouth every 8 (eight) hours as needed for up to 10 days for severe pain (pain score 7-10). 10/02/23 10/12/23  Alen Bleacher, NP  HYDROmorphone HCl (EXALGO) 8 MG TB24 Take 1 tablet (8 mg total) by mouth daily. 10/03/23   Alen Bleacher, NP  lidocaine (LIDODERM) 5 % Place 1 patch onto the skin daily. Remove & Discard patch within 12 hours or as directed by MD 10/02/23   Alen Bleacher, NP  losartan (COZAAR) 50 MG tablet Take 1 tablet (50 mg total) by mouth daily. 08/16/23   Andrey Farmer, PA-C  Menthol, Topical Analgesic, (BIOFREEZE) 10 % CREA Apply 1 Application topically 2 (two) times daily as needed (Apply to all joints for pain).    [provider]  metFORMIN (GLUCOPHAGE) 1000 MG tablet Take 1 tablet (1,000 mg total) by mouth 2 (two) times daily. 04/14/23   Laurey Morale, MD  minocycline (MINOCIN) 100 MG capsule Take 2 capsules (200 mg total) by mouth 2 (two) times daily. 09/10/23   Sabharwal, Aditya, DO  mirtazapine (REMERON) 15 MG tablet Take 1 tablet (15 mg total) by mouth at bedtime. 04/14/23   Laurey Morale, MD  Multiple Vitamin (MULTIVITAMIN WITH MINERALS) TABS tablet Take 1 tablet by mouth daily. 02/20/23   Alen Bleacher, NP  pantoprazole (PROTONIX) 40 MG tablet Take 1 tablet (40 mg total) by mouth daily. 04/14/23    Laurey Morale, MD  rosuvastatin (CRESTOR) 20 MG tablet Take 1 tablet (20 mg total) by mouth daily. 08/15/23   Andrey Farmer, PA-C  senna-docusate (SENOKOT-S) 8.6-50 MG tablet Take 2 tablets by mouth at bedtime. 10/02/23   Alen Bleacher, NP  sertraline (ZOLOFT) 50 MG tablet Take 1 tablet (50 mg total) by mouth daily. Patient taking differently: Take 50 mg by mouth at bedtime. 04/14/23   Laurey Morale, MD  spironolactone (ALDACTONE) 25 MG tablet Take 1 tablet (25 mg total) by mouth daily. 10/02/23   Alen Bleacher, NP  tamsulosin (FLOMAX) 0.4 MG CAPS capsule Take 1 capsule (0.4 mg total) by mouth daily. 04/14/23   Laurey Morale, MD  thiamine (VITAMIN B-1) 100 MG tablet Take 1 tablet (100 mg total) by mouth daily. 02/20/23   Alen Bleacher, NP  traZODone (DESYREL) 150 MG tablet Take 1 tablet (150 mg total) by mouth at bedtime. 03/11/22   Laurey Morale, MD  TRELEGY ELLIPTA 100-62.5-25 MCG/ACT AEPB Inhale 1 puff into the lungs daily. 04/14/23   Laurey Morale, MD   Allergies  Allergen Reactions   Lorette Ang [Vancomycin] Nausea And Vomiting and Other (See Comments)    Arthralgias    Review of Systems  Constitutional:  Positive for fatigue.  Genitourinary:  Abdominal hernia  Neurological:  Positive for weakness.    Physical Exam Pulmonary:     Effort: Pulmonary effort is normal.  Abdominal:     Comments: Noted large abdominal hernia  Skin:    General: Skin is warm and dry.  Neurological:     Mental Status: He is alert.     Vital Signs: BP (!) 118/100 (BP Location: Left Arm)   Pulse 85   Temp 97.6 F (36.4 C) (Oral)   Resp 11   Ht 5\' 10"  (1.778 m)   Wt 80.9 kg   SpO2 95%   BMI 25.59 kg/m  Pain Scale: 0-10   Pain Score: Asleep   SpO2: SpO2: 95 % O2 Device:SpO2: 95 % O2 Flow Rate: .   IO: Intake/output summary:  Intake/Output Summary (Last 24 hours) at 10/09/2023 1043 Last data filed at 10/09/2023 0739 Gross per 24 hour  Intake 112.89 ml  Output 540 ml  Net  -427.11 ml    LBM:   Baseline Weight: Weight: 77.1 kg Most recent weight: Weight: 80.9 kg     Palliative Assessment/Data:     Time:  75 minutes  Signed by: Lorinda Creed, NP   Please contact Palliative Medicine Team phone at [phone] for questions and concerns.  For individual provider: See Loretha Stapler

## 2023-10-09 NOTE — Progress Notes (Signed)
CSW met with the patient at bedside to provide support and follow up on the previous palliative care visit. During the discussion, the patient expressed a clear desire not to escalate care and specifically stated that he does not wish to be intubated.  CSW engaged the patient in a detailed conversation about his wishes regarding end-of-life care. They explored hospice services available in the community, emphasizing the benefits and options that could align with the patient's values and preferences. Kevin Underwood reflected on the discussion and stated, "I am getting there but not yet," indicating that he is contemplating hospice but is not ready to make that decision at this time. CSW continues to offer supportive intervention and patient has CSW contcat information. Lasandra Beech, LCSW, CCSW-MCS [phone]

## 2023-10-09 NOTE — Plan of Care (Signed)
  Problem: Education: Goal: Patient will understand all VAD equipment and how it functions Outcome: Progressing Goal: Patient will be able to verbalize current INR target range and antiplatelet therapy for discharge home Outcome: Progressing   Problem: Cardiac: Goal: LVAD will function as expected and patient will experience no clinical alarms Outcome: Progressing   Problem: Education: Goal: Knowledge of General Education information will improve Description: Including pain rating scale, medication(s)/side effects and non-pharmacologic comfort measures Outcome: Progressing   Problem: Health Behavior/Discharge Planning: Goal: Ability to manage health-related needs will improve Outcome: Progressing   Problem: Clinical Measurements: Goal: Ability to maintain clinical measurements within normal limits will improve Outcome: Progressing Goal: Will remain free from infection Outcome: Progressing Goal: Diagnostic test results will improve Outcome: Progressing Goal: Respiratory complications will improve Outcome: Progressing Goal: Cardiovascular complication will be avoided Outcome: Progressing   Problem: Activity: Goal: Risk for activity intolerance will decrease Outcome: Progressing   Problem: Nutrition: Goal: Adequate nutrition will be maintained Outcome: Progressing   Problem: Coping: Goal: Level of anxiety will decrease Outcome: Progressing   Problem: Elimination: Goal: Will not experience complications related to bowel motility Outcome: Progressing Goal: Will not experience complications related to urinary retention Outcome: Progressing   Problem: Pain Management: Goal: General experience of comfort will improve Outcome: Progressing   Problem: Safety: Goal: Ability to remain free from injury will improve Outcome: Progressing   Problem: Skin Integrity: Goal: Risk for impaired skin integrity will decrease Outcome: Progressing

## 2023-10-09 NOTE — Progress Notes (Signed)
PHARMACY - ANTICOAGULATION CONSULT NOTE  Pharmacy Consult for warfarin Indication:  LVAD HM3  Allergies  Allergen Reactions   Firvanq [Vancomycin] Nausea And Vomiting and Other (See Comments)    Arthralgias     Patient Measurements: Height: 5\' 10"  (177.8 cm) Weight: 80.9 kg (178 lb 5.6 oz) IBW/kg (Calculated) : 73 Heparin Dosing Weight: 77.1 kg   Vital Signs: Temp: 97.5 F (36.4 C) (10/31 0834) Temp Source: Oral (10/31 0834) BP: 118/100 (10/31 0834) Pulse Rate: 85 (10/31 0834)  Labs: Recent Labs    10/08/23 2348 10/09/23 0137  HGB 8.1*  --   HCT 24.8*  --   PLT 162  --   LABPROT 18.9*  --   INR 1.6*  --   CREATININE 2.22*  --   TROPONINIHS 17 16    Estimated Creatinine Clearance: 35.6 mL/min (A) (by C-G formula based on SCr of 2.22 mg/dL (H)).   Medical History: Past Medical History:  Diagnosis Date   "    Arthritis    CAD (coronary artery disease)    a. s/p CABG in 11/2019 with LIMA-LAD, SVG-OM1, SVG-PDA and SVG-D1   CHF (congestive heart failure) (HCC)    a. EF < 20% by echo in 11/2019   Essential hypertension    PAF (paroxysmal atrial fibrillation) (HCC)    Type 2 diabetes mellitus (HCC)     Medications:  Scheduled:   Assessment: 62 yom presenting with SOB and cough - on warfarin PTA for hx LVAD HM3 (LD 10/30).  PTA regimen is 4 mg daily except 6 mg Wed.   INR last night came back subtherapeutic at 1.6, of note he took his warfarin before admission. INR this morning came back subtherapeutic at 1.7. Hgb 8.1, plt 162. Trop 17>16. LDH stable at 114.  Goal of Therapy:  INR 2-2.5 Monitor platelets by anticoagulation protocol: Yes   Plan:  Order warfarin 5 tonight to get into goal range then anticipate returning back to PTA regimen No heparin bridge at this time Monitor daily INR, CBC, and for s/sx of bleeding   Thank you for allowing pharmacy to participate in this patient's care,  Sherron Monday, PharmD, BCCCP Clinical Pharmacist  Phone:  [phone] 10/09/2023 9:07 AM  Please check AMION for all [facility] phone numbers After 10:00 PM, call Main Pharmacy [phone]

## 2023-10-09 NOTE — ED Provider Notes (Signed)
[facility]ERG[facility]PARTMENT AT [facility] Provider Note   CSN: [id number] Arrival date & time: 10/08/23  2323     History  Chief Complaint  Patient presents with  . Shortness of Breath    Kevin Underwood. is a 62 y.o. male.  The history is provided by the patient and the EMS personnel.  Shortness of Breath Severity:  Severe Onset quality:  Gradual Duration:  12 hours Timing:  Constant Progression:  Worsening Chronicity:  Recurrent Context: not URI   Relieved by:  Nothing Worsened by:  Nothing Ineffective treatments:  None tried Associated symptoms: no fever, no vomiting and no wheezing   Risk factors: no obesity   Patient with LVAD with SOB since noon.  No CP.  No nausea or vomiting.  No known fevers.       Home Medications Prior to Admission medications   Medication Sig Start Date End Date Taking? Authorizing Provider  metoprolol succinate (TOPROL XL) 25 MG 24 hr tablet Take 1 tablet (25 mg total) by mouth daily. 08/16/23  Yes Andrey Farmer, PA-C  warfarin (COUMADIN) 4 MG tablet Take 1-1.5 tablets (4-6 mg total) by mouth See admin instructions. Take 1.5 tablets (6 mg) every Wednesday, Take 1 tablet (4 mg) all other days or as directed by Heart Failure clinic 10/02/23  Yes Alen Bleacher, NP  acetaminophen (TYLENOL) 325 MG tablet Take 2 tablets (650 mg total) by mouth every 4 (four) hours as needed for headache or mild pain. 02/20/23   Alen Bleacher, NP  albuterol (PROVENTIL) (2.5 MG/3ML) 0.083% nebulizer solution INHALE 3 ML BY NEBULIZATION EVERY 6 HOURS AS NEEDED FOR WHEEZING OR SHORTNESS OF BREATH Patient taking differently: Take 2.5 mg by nebulization every 6 (six) hours as needed for shortness of breath. 09/10/22   Laurey Morale, MD  albuterol (VENTOLIN HFA) 108 (90 Base) MCG/ACT inhaler Inhale 2 puffs into the lungs every 6 (six) hours as needed for wheezing or shortness of breath.    [provider]  allopurinol (ZYLOPRIM) 100 MG  tablet Take 1 tablet (100 mg total) by mouth daily. 12/04/21   Laurey Morale, MD  amLODipine (NORVASC) 10 MG tablet Take 1 tablet (10 mg total) by mouth daily. 08/16/23   Andrey Farmer, PA-C  ciprofloxacin (CIPRO) 750 MG tablet Take 1 tablet (750 mg total) by mouth 2 (two) times daily. 09/10/23   Sabharwal, Aditya, DO  colchicine 0.6 MG tablet Take 1 tablet (0.6 mg total) by mouth daily as needed (gout pain). Patient taking differently: Take 0.6 mg by mouth daily. 04/14/23   Laurey Morale, MD  COMBIVENT RESPIMAT 20-100 MCG/ACT AERS respimat Inhale 1 puff into the lungs every 6 (six) hours. 09/10/22   [provider]  fluticasone Aleda Grana ALLERGY RELIEF) 50 MCG/ACT nasal spray Place 2 sprays into both nostrils as needed for allergies or rhinitis.    [provider]  gabapentin (NEURONTIN) 300 MG capsule Take 1 capsule (300 mg total) by mouth 3 (three) times daily. 08/15/23   Andrey Farmer, PA-C  glipiZIDE (GLUCOTROL) 5 MG tablet Take 1 tablet (5 mg total) by mouth daily. 04/14/23 04/13/24  Laurey Morale, MD  HYDROmorphone (DILAUDID) 2 MG tablet Take 1 tablet (2 mg total) by mouth every 8 (eight) hours as needed for up to 10 days for severe pain (pain score 7-10). 10/02/23 10/12/23  Alen Bleacher, NP  HYDROmorphone HCl (EXALGO) 8 MG TB24 Take 1 tablet (8 mg total)  by mouth daily. 10/03/23   Alen Bleacher, NP  lidocaine (LIDODERM) 5 % Place 1 patch onto the skin daily. Remove & Discard patch within 12 hours or as directed by MD 10/02/23   Alen Bleacher, NP  losartan (COZAAR) 50 MG tablet Take 1 tablet (50 mg total) by mouth daily. 08/16/23   Andrey Farmer, PA-C  Menthol, Topical Analgesic, (BIOFREEZE) 10 % CREA Apply 1 Application topically 2 (two) times daily as needed (Apply to all joints for pain).    [provider]  metFORMIN (GLUCOPHAGE) 1000 MG tablet Take 1 tablet (1,000 mg total) by mouth 2 (two) times daily. 04/14/23   Laurey Morale, MD  minocycline  (MINOCIN) 100 MG capsule Take 2 capsules (200 mg total) by mouth 2 (two) times daily. 09/10/23   Sabharwal, Aditya, DO  mirtazapine (REMERON) 15 MG tablet Take 1 tablet (15 mg total) by mouth at bedtime. 04/14/23   Laurey Morale, MD  Multiple Vitamin (MULTIVITAMIN WITH MINERALS) TABS tablet Take 1 tablet by mouth daily. 02/20/23   Alen Bleacher, NP  pantoprazole (PROTONIX) 40 MG tablet Take 1 tablet (40 mg total) by mouth daily. 04/14/23   Laurey Morale, MD  rosuvastatin (CRESTOR) 20 MG tablet Take 1 tablet (20 mg total) by mouth daily. 08/15/23   Andrey Farmer, PA-C  senna-docusate (SENOKOT-S) 8.6-50 MG tablet Take 2 tablets by mouth at bedtime. 10/02/23   Alen Bleacher, NP  sertraline (ZOLOFT) 50 MG tablet Take 1 tablet (50 mg total) by mouth daily. Patient taking differently: Take 50 mg by mouth at bedtime. 04/14/23   Laurey Morale, MD  spironolactone (ALDACTONE) 25 MG tablet Take 1 tablet (25 mg total) by mouth daily. 10/02/23   Alen Bleacher, NP  tamsulosin (FLOMAX) 0.4 MG CAPS capsule Take 1 capsule (0.4 mg total) by mouth daily. 04/14/23   Laurey Morale, MD  thiamine (VITAMIN B-1) 100 MG tablet Take 1 tablet (100 mg total) by mouth daily. 02/20/23   Alen Bleacher, NP  traZODone (DESYREL) 150 MG tablet Take 1 tablet (150 mg total) by mouth at bedtime. 03/11/22   Laurey Morale, MD  TRELEGY ELLIPTA 100-62.5-25 MCG/ACT AEPB Inhale 1 puff into the lungs daily. 04/14/23   Laurey Morale, MD      Allergies    Firvanq [vancomycin]    Review of Systems   Review of Systems  Constitutional:  Negative for fever.  HENT:  Negative for facial swelling.   Eyes:  Negative for redness.  Respiratory:  Positive for shortness of breath. Negative for wheezing.   Gastrointestinal:  Negative for vomiting.  All other systems reviewed and are negative.   Physical Exam Updated Vital Signs BP 98/84   Pulse 95   Temp 97.7 F (36.5 C) (Oral)   Resp 20   Ht 5\' 10"  (1.778 m)   Wt 77.1 kg   SpO2 99%    BMI 24.39 kg/m  Physical Exam Vitals and nursing note reviewed. Exam conducted with a chaperone present.  Constitutional:      General: He is not in acute distress.    Appearance: Normal appearance. He is well-developed. He is not diaphoretic.  HENT:     Head: Normocephalic and atraumatic.     Nose: Nose normal.  Eyes:     Conjunctiva/sclera: Conjunctivae normal.     Pupils: Pupils are equal, round, and reactive to light.  Cardiovascular:     Rate and Rhythm: Normal rate  and regular rhythm.     Pulses: Normal pulses.     Heart sounds: Normal heart sounds.  Pulmonary:     Effort: Pulmonary effort is normal.     Breath sounds: Normal breath sounds. Decreased air movement present. No stridor. No wheezing or rales.  Abdominal:     General: Bowel sounds are normal.     Palpations: Abdomen is soft.     Tenderness: There is no abdominal tenderness. There is no guarding or rebound.     Hernia: A hernia is present.     Comments: Large ventral hernia   Musculoskeletal:        General: Normal range of motion.     Cervical back: Normal range of motion and neck supple.  Skin:    General: Skin is warm and dry.     Capillary Refill: Capillary refill takes less than 2 seconds.  Neurological:     General: No focal deficit present.     Mental Status: He is alert and oriented to person, place, and time.     Deep Tendon Reflexes: Reflexes normal.  Psychiatric:        Mood and Affect: Mood normal.    ED Results / Procedures / Treatments   Labs (all labs ordered are listed, but only abnormal results are displayed) Results for orders placed or performed during the hospital encounter of 10/08/23  CBC with Differential  Result Value Ref Range   WBC 15.4 (H) 4.0 - 10.5 K/uL   RBC 3.18 (L) 4.22 - 5.81 MIL/uL   Hemoglobin 8.1 (L) 13.0 - 17.0 g/dL   HCT 41.6 (L) 60.6 - 30.1 %   MCV 78.0 (L) 80.0 - 100.0 fL   MCH 25.5 (L) 26.0 - 34.0 pg   MCHC 32.7 30.0 - 36.0 g/dL   RDW 60.1 09.3 - 23.5 %    Platelets 162 150 - 400 K/uL   nRBC 0.0 0.0 - 0.2 %   Neutrophils Relative % 84 %   Neutro Abs 12.9 (H) 1.7 - 7.7 K/uL   Lymphocytes Relative 3 %   Lymphs Abs 0.5 (L) 0.7 - 4.0 K/uL   Monocytes Relative 10 %   Monocytes Absolute 1.5 (H) 0.1 - 1.0 K/uL   Eosinophils Relative 0 %   Eosinophils Absolute 0.0 0.0 - 0.5 K/uL   Basophils Relative 0 %   Basophils Absolute 0.1 0.0 - 0.1 K/uL   Immature Granulocytes 3 %   Abs Immature Granulocytes 0.42 (H) 0.00 - 0.07 K/uL  Comprehensive metabolic panel  Result Value Ref Range   Sodium 130 (L) 135 - 145 mmol/L   Potassium 4.1 3.5 - 5.1 mmol/L   Chloride 98 98 - 111 mmol/L   CO2 17 (L) 22 - 32 mmol/L   Glucose, Bld 132 (H) 70 - 99 mg/dL   BUN 49 (H) 8 - 23 mg/dL   Creatinine, Ser 5.73 (H) 0.61 - 1.24 mg/dL   Calcium 7.8 (L) 8.9 - 10.3 mg/dL   Total Protein 5.1 (L) 6.5 - 8.1 g/dL   Albumin 1.9 (L) 3.5 - 5.0 g/dL   AST 33 15 - 41 U/L   ALT 31 0 - 44 U/L   Alkaline Phosphatase 135 (H) 38 - 126 U/L   Total Bilirubin 0.4 0.3 - 1.2 mg/dL   GFR, Estimated 33 (L) >60 mL/min   Anion gap 15 5 - 15  Lactate dehydrogenase  Result Value Ref Range   LDH 114 98 - 192 U/L  Brain natriuretic  peptide  Result Value Ref Range   B Natriuretic Peptide 248.5 (H) 0.0 - 100.0 pg/mL  Protime-INR  Result Value Ref Range   Prothrombin Time 18.9 (H) 11.4 - 15.2 seconds   INR 1.6 (H) 0.8 - 1.2  Troponin I (High Sensitivity)  Result Value Ref Range   Troponin I (High Sensitivity) 17 <18 ng/L   DG Chest Portable 1 View  Result Date: 10/09/2023 CLINICAL DATA:  Shortness of breath EXAM: PORTABLE CHEST 1 VIEW COMPARISON:  Chest x-ray 09/23/2023 FINDINGS: LVAD again noted. Sternotomy wires are present. The heart is enlarged, unchanged. There is no focal lung infiltrate, pleural effusion or pneumothorax. There surgical clips in the bilateral axillary regions. There is a healed left clavicular fracture. No acute fractures are seen. IMPRESSION: 1. No active  disease. 2. Cardiomegaly. Electronically Signed   By: Darliss Cheney M.D.   On: 10/09/2023 01:02   ECHOCARDIOGRAM COMPLETE  Result Date: 09/25/2023    ECHOCARDIOGRAM REPORT   Patient Name:   Kevin Underwood. Date of Exam: 09/25/2023 Medical Rec #:  [id number]            Height:       70.0 in Accession #:    [id number]           Weight:       186.7 lb Date of Birth:  06-23-1961            BSA:          2.027 m Patient Age:    62 years             BP:           104/82 mmHg Patient Gender: M                    HR:           100 bpm. Exam Location:  Inpatient Procedure: 2D Echo, Color Doppler and Cardiac Doppler Indications:    LVAD, Endocarditis  History:        Patient has prior history of Echocardiogram examinations, most                 recent 06/17/2022. LVAD and CHF, CAD, Prior CABG, COPD; Risk                 Factors:Dyslipidemia and h/o polysubstance use.  Sonographer:    Milbert Coulter Referring Phys: [id number] PETER VANTRIGT  Sonographer Comments: Technically difficult study due to poor echo windows, no apical window and no subcostal window. Image acquisition challenging due to patient body habitus and Image acquisition challenging due to COPD. IMPRESSIONS  1. LVAD cannula in apex. Interventricular septum is midline. Aortic valve with minimal opening. Left ventricular ejection fraction, by estimation, is 20 to 25%. The left ventricle has severely decreased function. The left ventricle demonstrates global hypokinesis. There is mild concentric left ventricular hypertrophy. Left ventricular diastolic parameters are indeterminate.  2. Right ventricular systolic function is moderately reduced. The right ventricular size is moderately enlarged.  3. Right atrial size was mildly dilated.  4. The mitral valve was not well visualized. Trivial mitral valve regurgitation. No evidence of mitral stenosis.  5. The aortic valve was not well visualized. There is mild calcification of the aortic valve. Aortic valve regurgitation  is not visualized. No aortic stenosis is present. Conclusion(s)/Recommendation(s): Technically very limited study due to poor sound wave transmission. FINDINGS  Left Ventricle: LVAD cannula in apex. Interventricular septum is midline. Aortic valve  with minimal opening. Left ventricular ejection fraction, by estimation, is 20 to 25%. The left ventricle has severely decreased function. The left ventricle demonstrates global hypokinesis. The left ventricular internal cavity size was normal in size. There is mild concentric left ventricular hypertrophy. Left ventricular diastolic parameters are indeterminate. Right Ventricle: The right ventricular size is moderately enlarged. No increase in right ventricular wall thickness. Right ventricular systolic function is moderately reduced. Left Atrium: Left atrial size was normal in size. Right Atrium: Right atrial size was mildly dilated. Pericardium: There is no evidence of pericardial effusion. Mitral Valve: The mitral valve was not well visualized. Trivial mitral valve regurgitation. No evidence of mitral valve stenosis. Tricuspid Valve: The tricuspid valve is not well visualized. Tricuspid valve regurgitation is mild . No evidence of tricuspid stenosis. Aortic Valve: The aortic valve was not well visualized. There is mild calcification of the aortic valve. Aortic valve regurgitation is not visualized. No aortic stenosis is present. Pulmonic Valve: The pulmonic valve was normal in structure. Pulmonic valve regurgitation is trivial. No evidence of pulmonic stenosis. Aorta: The aortic root is normal in size and structure. Venous: The inferior vena cava was not well visualized. IAS/Shunts: No atrial level shunt detected by color flow Doppler.  LEFT VENTRICLE PLAX 2D LVIDd:         4.50 cm LVIDs:         3.90 cm LV PW:         1.30 cm LV IVS:        1.20 cm  Arvilla Meres MD Electronically signed by Arvilla Meres MD Signature Date/Time: 09/25/2023/6:33:50 PM    Final     CT CERVICAL SPINE WO CONTRAST  Result Date: 09/25/2023 CLINICAL DATA:  Neck pain, infection or inflammation suspected. EXAM: CT CERVICAL SPINE WITHOUT CONTRAST TECHNIQUE: Multidetector CT imaging of the cervical spine was performed without intravenous contrast. Multiplanar CT image reconstructions were also generated. RADIATION DOSE REDUCTION: This exam was performed according to the departmental dose-optimization program which includes automated exposure control, adjustment of the mA and/or kV according to patient size and/or use of iterative reconstruction technique. COMPARISON:  None Available. FINDINGS: Alignment: There is straightening of the normal cervical lordosis. There is no antero or retrolisthesis. Skull base and vertebrae: Skull base alignment is maintained. Vertebral body heights are preserved. There is no evidence of acute fracture. There is no suspicious osseous lesion. There is no osseous erosion or destruction. There is fusion across the left posterior elements at C2-C3. Soft tissues and spinal canal: No prevertebral fluid or swelling. No visible canal hematoma. Disc levels: There is moderate disc space narrowing at C5-C6 and C6-C7. C2-C3: Moderate left facet arthropathy without significant spinal canal or neural foraminal stenosis. C3-C4: There is mild left worse than right uncovertebral ridging and moderate left facet arthropathy resulting in moderate left and no significant right neural foraminal stenosis and no significant spinal canal stenosis. C4-C5: There is a shallow disc protrusion and mild-to-moderate left and mild right facet arthropathy without significant spinal canal or neural foraminal stenosis. C5-C6: There is a shallow disc protrusion and mild uncovertebral ridging without significant spinal canal or neural foraminal stenosis. C6-C7: There is a shallow disc protrusion and left worse than right uncovertebral ridging resulting in moderate left and mild right neural foraminal  stenosis and mild spinal canal stenosis. C7-T1: No significant spinal canal or neural foraminal stenosis. Upper chest: There is emphysema in the lung apices. Other: None. IMPRESSION: 1. No acute finding in the cervical spine. No CT  evidence of infection. If there is high clinical concern, MRI with contrast is recommended. 2. Multilevel degenerative changes as detailed above with up to mild spinal canal stenosis and moderate left neural foraminal stenosis at C6-C7. Electronically Signed   By: Lesia Hausen M.D.   On: 09/25/2023 15:48   CT ABDOMEN PELVIS W CONTRAST  Result Date: 09/23/2023 CLINICAL DATA:  Nausea vomiting diarrhea EXAM: CT ABDOMEN AND PELVIS WITH CONTRAST TECHNIQUE: Multidetector CT imaging of the abdomen and pelvis was performed using the standard protocol following bolus administration of intravenous contrast. RADIATION DOSE REDUCTION: This exam was performed according to the departmental dose-optimization program which includes automated exposure control, adjustment of the mA and/or kV according to patient size and/or use of iterative reconstruction technique. CONTRAST:  75mL OMNIPAQUE IOHEXOL 350 MG/ML SOLN COMPARISON:  CT 09/08/2023, 07/07/2023, 01/27/2023, 06/14/2022 FINDINGS: Lower chest: Lung bases demonstrate no acute airspace disease. Artifact from LVAD. Hepatobiliary: No focal liver abnormality is seen. No gallstones, gallbladder wall thickening, or biliary dilatation. Pancreas: Unremarkable. No pancreatic ductal dilatation or surrounding inflammatory changes. Spleen: Normal in size without focal abnormality. Adrenals/Urinary Tract: Adrenal glands are stable in appearance. Punctate nonobstructing left kidney stone. Subcentimeter hypodensities too small to further characterize, no imaging follow-up is recommended. No hydronephrosis. The bladder is unremarkable. Stomach/Bowel: Stomach is nondistended. Mild fluid distension of left upper quadrant jejunal bowel loops without transition point  suggestive of mild ileus or enteritis. No acute bowel wall thickening. Otherwise decompressed small bowel. Negative appendix Vascular/Lymphatic: Advanced aortic atherosclerosis. No aneurysm. No suspicious lymph nodes Reproductive: Negative prostate Other: Negative for pelvic effusion or free air. Diastasis of the anterior abdominal wall musculature with wide neck large hernia containing mesentery and bowel but no obstruction at this time. Residual soft tissue thickening about the drive line in the upper anterior abdomen without new fluid. Musculoskeletal: Posterior spinal hardware at the lumbosacral spine. Multilevel degenerative changes. No acute osseous abnormality IMPRESSION: 1. Mild fluid distension of left upper quadrant jejunal bowel loops without discrete transition point suggestive of mild ileus or enteritis. 2. Otherwise no CT evidence for acute intra-abdominal or pelvic abnormality. LVAD with similar skin thickening and circumferential soft tissue thickening about the drive line in the upper abdomen without development of new fluid around the drive line since the prior exam. 3. 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 Aortic atherosclerosis. Aortic Atherosclerosis (ICD10-I70.0). Electronically Signed   By: Jasmine Pang M.D.   On: 09/23/2023 17:12   DG Chest Portable 1 View  Result Date: 09/23/2023 CLINICAL DATA:  Shortness of breath. EXAM: PORTABLE CHEST 1 VIEW COMPARISON:  09/08/2023. FINDINGS: Similar cardiomediastinal silhouette. LVAD in similar location. Median sternotomy. Atrial appendage clip. No consolidation. No visible pleural effusions or pneumothorax. Remote left clavicular fracture. Polyarticular degenerative change. IMPRESSION: No evidence of acute cardiopulmonary disease. Electronically Signed   By: Feliberto Harts M.D.   On: 09/23/2023 15:27    Radiology DG Chest Portable 1 View  Result  Date: 10/09/2023 CLINICAL DATA:  Shortness of breath EXAM: PORTABLE CHEST 1 VIEW COMPARISON:  Chest x-ray 09/23/2023 FINDINGS: LVAD again noted. Sternotomy wires are present. The heart is enlarged, unchanged. There is no focal lung infiltrate, pleural effusion or pneumothorax. There surgical clips in the bilateral axillary regions. There is a healed left clavicular fracture. No acute fractures are seen. IMPRESSION: 1. No active disease. 2. Cardiomegaly. Electronically Signed   By: Darliss Cheney M.D.   On: 10/09/2023 01:02  Procedures .Critical Care E&M  Performed by: Cy Blamer, MD Critical care provider statement:    Critical care end time:  10/09/2023 2:09 AM   Critical care time was exclusive of:  Separately billable procedures and treating other patients   Critical care was necessary to treat or prevent imminent or life-threatening deterioration of the following conditions:  Cardiac failure, circulatory failure and respiratory failure   Critical care was time spent personally by me on the following activities:  Ordering and review of laboratory studies, ordering and review of radiographic studies, ordering and performing treatments and interventions, discussions with consultants, examination of patient, pulse oximetry, re-evaluation of patient's condition, review of old charts and interpretation of cardiac output measurements   Care discussed with: admitting provider   After initial E/M assessment, critical care services were subsequently performed that were exclusive of separately billable procedures or treatment.      Medications Ordered in ED Medications  ceFEPIme (MAXIPIME) 2 g in sodium chloride 0.9 % 100 mL IVPB (has no administration in time range)  DAPTOmycin (CUBICIN) IVPB 700 mg/167mL premix (has no administration in time range)  iohexol (OMNIPAQUE) 350 MG/ML injection 75 mL (75 mLs Intravenous Contrast Given 10/09/23 0033)    ED Course/ Medical Decision Making/ A&P                                  Medical Decision Making Patient with LVAD and SOB.  Ongoing ventral hernia   Amount and/or Complexity of Data Reviewed Independent Historian: EMS    Details: See above  External Data Reviewed: labs and notes.    Details: Previous admission notes reviewed, white count is up from discharge of 9.1, elevated creatinine that is not patient's baseline  Labs: ordered.    Details: White count elevated 15.4, low hemoglobin 8.1, normal platelets.  Low sodium 130 (at baseline), normal potassium 4.1, elevated creatinine 2.22 ldh 114, elevated BNP 248.5, blood cultures ordered  Radiology: ordered and independent interpretation performed.    Details: Ventral hernia no free air by me on CT ECG/medicine tests: ordered. Discussion of management or test interpretation with external provider(s): Case d/w Dr. Gasper Lloyd, will admit for observation   Risk Prescription drug management. Decision regarding hospitalization.  Critical Care Total time providing critical care: 31 minutes   Final Clinical Impression(s) / ED Diagnoses Final diagnoses:  AKI (acute kidney injury) (HCC)  Shortness of breath  LVAD (left ventricular assist device) present [facility])   The patient appears reasonably stabilized for admission considering the current resources, flow, and capabilities available in the ED at this time, and I doubt any other [facility] requiring further screening and/or treatment in the ED prior to admission.  Rx / DC Orders ED Discharge Orders     None         Tamotsu Wiederholt, MD 10/09/23 6045

## 2023-10-09 NOTE — Progress Notes (Signed)
Pharmacy Antibiotic Note  Kevin Underwood. is a 62 y.o. male with LVAD and h/o chronic driveline infections admitted on 10/08/2023 with hypotension, possible infection.  Pharmacy has been consulted for daptomycin and Cefepime dosing.  Plan: Daptomycin 700 mg IV q24h Cefepime 2 g IV q12h  Height: 5\' 10"  (177.8 cm) Weight: 77.1 kg (170 lb) IBW/kg (Calculated) : 73  Temp (24hrs), Avg:97.8 F (36.6 C), Min:97.7 F (36.5 C), Max:97.9 F (36.6 C)  Recent Labs  Lab 10/08/23 2348  WBC 15.4*  CREATININE 2.22*    Estimated Creatinine Clearance: 35.6 mL/min (A) (by C-G formula based on SCr of 2.22 mg/dL (H)).    Allergies  Allergen Reactions   Firvanq [Vancomycin] Nausea And Vomiting and Other (See Comments)    Arthralgias      Gorgeous [place], Gary Fleet 10/09/2023 1:42 AM

## 2023-10-09 NOTE — Progress Notes (Signed)
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

## 2023-10-09 NOTE — ED Notes (Signed)
ED TO INPATIENT HANDOFF REPORT  ED Nurse Name and Phone #: Jaquelyn Bitter [phone]  S Name/Age/Gender Kevin Underwood. 62 y.o. male Room/Bed: 033C/033C  Code Status   Code Status: Partial Code  Home/SNF/Other Home Patient oriented to: self, place, time, and situation Is this baseline? 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  Pt also has lower back pain that has been there since getting hernia 3 months ago.  Pt is LVAD patient and placed on bedside power by rapid response nurse on arrival.  Pt A&Ox4, pale, hypotensive and given 600cc fluid en route.   Allergies Allergies  Allergen Reactions   Firvanq [Vancomycin] Nausea And Vomiting and Other (See Comments)    Arthralgias     Level of Care/Admitting Diagnosis ED Disposition     ED Disposition  Admit   Condition  --   Comment  Hospital Area: [facility] [[id number]]  Level of Care: Progressive [102]  Admit to Progressive based on following criteria: CARDIOVASCULAR & THORACIC of moderate stability with acute coronary syndrome symptoms/low risk myocardial infarction/hypertensive urgency/arrhythmias/heart failure potentially compromising stability and stable post cardiovascular intervention patients.  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) [[id number]]  Admitting Physician: Dorthula Nettles [[id number]]  Attending Physician: Dorthula Nettles [phone]  Bed request comments: 2c          B Medical/Surgery History Past Medical  History:  Diagnosis Date   "    Arthritis    CAD (coronary artery disease)    a. s/p CABG in 11/2019 with LIMA-LAD, SVG-OM1, SVG-PDA and SVG-D1   CHF (congestive heart failure) (HCC)    a. EF < 20% by echo in 11/2019   Essential hypertension    PAF (paroxysmal atrial fibrillation) (HCC)    Type 2 diabetes mellitus (HCC)    Past Surgical History:  Procedure Laterality Date   APPLICATION OF WOUND VAC Left 06/14/2022   Procedure: APPLICATION OF WOUND VAC;  Surgeon: Lovett Sox, MD;  [facility];  Service: Thoracic;  Laterality: Left;   APPLICATION OF WOUND VAC N/A 06/20/2022   Procedure: VAD POWERCORD TUNNEL WOUND VAC CHANGE;  Surgeon: Lovett Sox, MD;  [facility];  Service: Thoracic;  Laterality: N/A;   APPLICATION OF WOUND VAC N/A 06/27/2022   Procedure: WOUND VAC CHANGE;  Surgeon: Lovett Sox, MD;  [facility];  Service: Thoracic;  Laterality: N/A;   APPLICATION OF WOUND VAC N/A 07/08/2022   Procedure: WOUND VAC CHANGE;  Surgeon: Lovett Sox, MD;  [facility];  Service: Thoracic;  Laterality: N/A;   APPLICATION OF WOUND VAC N/A 01/28/2023   Procedure: APPLICATION OF WOUND VAC;  Surgeon: Lovett Sox, MD;  [facility];  Service: Thoracic;  Laterality: N/A;   APPLICATION OF WOUND VAC N/A 02/05/2023   Procedure: WOUND VAC CHANGE;  Surgeon: Lovett Sox, MD;  [facility];  Service: Thoracic;  Laterality: N/A;   APPLICATION OF WOUND VAC N/A 02/12/2023   Procedure: REMOVAL OF WOUND VAC DRESSING;  Surgeon: Lovett Sox, MD;  [facility];  Service:  Thoracic;  Laterality: N/A;   APPLICATION OF WOUND VAC N/A 07/09/2023   Procedure: APPLICATION OF WOUND VAC;  Surgeon: Lovett Sox, MD;  [facility];  Service: Thoracic;  Laterality: N/A;   BACK SURGERY     CLIPPING OF ATRIAL APPENDAGE N/A 11/26/2019   Procedure: Clipping Of Atrial Appendage using AtriCure 40 Clip;  Surgeon: [name], MD;  [facility];  Service: Open Heart Surgery;   Laterality: N/A;   CORONARY ARTERY BYPASS GRAFT N/A 11/26/2019   Procedure: CORONARY ARTERY BYPASS GRAFTING (CABG) using endoscopic greater saphenous vein harvest: svc to OM; svc to Diag; svc to PD; and LIMA to LAD.;  Surgeon: [name], MD;  [facility];  Service: Open Heart Surgery;  Laterality: N/A;   FOOT SURGERY     HAND SURGERY     INSERTION OF IMPLANTABLE LEFT VENTRICULAR ASSIST DEVICE N/A 11/17/2020   Procedure: INSERTION OF IMPLANTABLE LEFT VENTRICULAR ASSIST DEVICE - HM3;  Surgeon: [name], MD;  [facility];  Service: Open Heart Surgery;  Laterality: N/A;   INTRAOPERATIVE TRANSESOPHAGEAL ECHOCARDIOGRAM  12/04/2019   Procedure: Intraoperative Transesophageal Echocardiogram;  Surgeon: [name], MD;  [facility];  Service: Open Heart Surgery;;   MAZE N/A 11/26/2019   Procedure: MAZE using Bilateral Pulmonary Vein isolation.;  Surgeon: [name], MD;  [facility];  Service: Open Heart Surgery;  Laterality: N/A;   MULTIPLE EXTRACTIONS WITH ALVEOLOPLASTY N/A 11/16/2020   Procedure: MULTIPLE EXTRACTION WITH ALVEOLOPLASTY;  Surgeon: Sharman Cheek, DMD;  [facility];  Service: Dentistry;  Laterality: N/A;   PLACEMENT OF IMPELLA LEFT VENTRICULAR ASSIST DEVICE N/A 11/24/2019   Procedure: PLACEMENT OF IMPELLA 5.5 LEFT VENTRICULAR ASSIST DEVICE;  Surgeon: [name], MD;  [facility];  Service: Open Heart Surgery;  Laterality: N/A;   PLACEMENT OF IMPELLA LEFT VENTRICULAR ASSIST DEVICE Left 11/10/2020   Procedure: PLACEMENT OF IMPELLA 5.5 LEFT VENTRICULAR ASSIST DEVICE VIA  LEFT AVILLARY ARTERY;  Surgeon: [name], MD;  [facility];  Service: Open Heart Surgery;  Laterality: Left;   REMOVAL OF IMPELLA LEFT VENTRICULAR ASSIST DEVICE Right 12/04/2019   Procedure: REMOVAL OF IMPELLA LEFT VENTRICULAR ASSIST DEVICE, right axilla;  Surgeon: [name], MD;  Location: [facility] OR;  Service: Open Heart Surgery;  Laterality:  Right;   RIGHT/LEFT HEART CATH AND CORONARY ANGIOGRAPHY N/A 11/24/2019   Procedure: RIGHT/LEFT HEART CATH AND CORONARY ANGIOGRAPHY;  Surgeon: Laurey Morale, MD;  Location: [facility] INVASIVE CV LAB;  Service: Cardiovascular;  Laterality: N/A;   RIGHT/LEFT HEART CATH AND CORONARY/GRAFT ANGIOGRAPHY N/A 11/09/2020   Procedure: RIGHT/LEFT HEART CATH AND CORONARY/GRAFT ANGIOGRAPHY;  Surgeon: [name], MD;  [facility];  Service: Cardiovascular;  Laterality: N/A;   STERNAL WOUND DEBRIDEMENT N/A 05/16/2022   Procedure: VAD DRIVELINE DEBRIDEMENT;  Surgeon: Lovett Sox, MD;  [facility];  Service: Thoracic;  Laterality: N/A;  VANC PULSE LAVAGE   STERNAL WOUND DEBRIDEMENT Left 06/14/2022   Procedure: DEBRIDEMENT OF VAD POWERCORD TUNNEL;  Surgeon: Lovett Sox, MD;  [facility];  Service: Thoracic;  Laterality: Left;   STERNAL WOUND DEBRIDEMENT N/A 06/27/2022   Procedure: ABDOMINAL WOUND DEBRIDEMENT, APPLICATION OF MYRIAD;  Surgeon: Lovett Sox, MD;  [facility];  Service: Thoracic;  Laterality: N/A;   STERNAL WOUND DEBRIDEMENT N/A 07/08/2022   Procedure: ABDOMINAL WOUND DEBRIDEMENT;  Surgeon: Lovett Sox, MD;  [facility];  Service: Thoracic;  Laterality: N/A;   STERNAL WOUND  DEBRIDEMENT N/A 01/28/2023   Procedure: ABDOMINAL DRIVELINE WOUND DEBRIDEMENT;  Surgeon: Lovett Sox, MD;  Location: [facility];  Service: Thoracic;  Laterality: N/A;   STERNAL WOUND DEBRIDEMENT N/A 02/05/2023   Procedure: ABDOMINAL DRIVELINE WOUND DEBRIDEMENT;  Surgeon: Lovett Sox, MD;  [facility];  Service: Thoracic;  Laterality: N/A;   STERNAL WOUND DEBRIDEMENT N/A 02/12/2023   Procedure: ABDOMINAL DRIVELINE WOUND DEBRIDEMENT AND APPLICATION OF MATRIX WOUND DRESSING;  Surgeon: Lovett Sox, MD;  [facility];  Service: Thoracic;  Laterality: N/A;   STERNAL WOUND DEBRIDEMENT N/A 07/09/2023   Procedure: DRIVELINE WOUND DEBRIDEMENT;  Surgeon: Lovett Sox, MD;  [facility];   Service: Thoracic;  Laterality: N/A;   STERNAL WOUND DEBRIDEMENT N/A 07/17/2023   Procedure: DRIVELINE WOUND DEBRIDEMENT;  Surgeon: Lovett Sox, MD;  [facility];  Service: Thoracic;  Laterality: N/A;   TEE WITHOUT CARDIOVERSION N/A 11/24/2019   Procedure: TRANSESOPHAGEAL ECHOCARDIOGRAM (TEE);  Surgeon: [name], MD;  Location: [facility] OR;  Service: Open Heart Surgery;  Laterality: N/A;   TEE WITHOUT CARDIOVERSION N/A 11/26/2019   Procedure: TRANSESOPHAGEAL ECHOCARDIOGRAM (TEE);  Surgeon: [name], MD;  Location: [facility] OR;  Service: Open Heart Surgery;  Laterality: N/A;   TEE WITHOUT CARDIOVERSION N/A 11/10/2020   Procedure: TRANSESOPHAGEAL ECHOCARDIOGRAM (TEE);  Surgeon: [name], MD;  Location: [facility] OR;  Service: Open Heart Surgery;  Laterality: N/A;   TEE WITHOUT CARDIOVERSION N/A 11/17/2020   Procedure: TRANSESOPHAGEAL ECHOCARDIOGRAM (TEE);  Surgeon: [name], MD;  Location: [facility] OR;  Service: Open Heart Surgery;  Laterality: N/A;   WOUND EXPLORATION N/A 05/31/2022   Procedure: WOUND EXPLORATION, Debridement and Irrigation of abdominal wound;  Surgeon: Lovett Sox, MD;  [facility];  Service: Thoracic;  Laterality: N/A;     A IV Location/Drains/Wounds Patient Lines/Drains/Airways Status     Active Line/Drains/Airways     Name Placement date Placement time Site Days   Peripheral IV 10/08/23 20 G Distal;Left;Posterior Forearm 10/08/23  2348  Forearm  1   Peripheral IV 10/09/23 20 G Posterior;Right Forearm 10/09/23  0132  Forearm  less than 1            Intake/Output Last 24 hours No intake or output data in the 24 hours ending 10/09/23 0138  Labs/Imaging Results for orders placed or performed during the hospital encounter of 10/08/23 (from the past 48 hour(s))  CBC with Differential     Status: Abnormal   Collection Time: 10/08/23 11:48 PM  Result Value Ref Range   WBC 15.4 (H) 4.0 - 10.5 K/uL   RBC 3.18 (L) 4.22 - 5.81 MIL/uL    Hemoglobin 8.1 (L) 13.0 - 17.0 g/dL   HCT 41.6 (L) 60.6 - 30.1 %   MCV 78.0 (L) 80.0 - 100.0 fL   MCH 25.5 (L) 26.0 - 34.0 pg   MCHC 32.7 30.0 - 36.0 g/dL   RDW 60.1 09.3 - 23.5 %   Platelets 162 150 - 400 K/uL   nRBC 0.0 0.0 - 0.2 %   Neutrophils Relative % 84 %   Neutro Abs 12.9 (H) 1.7 - 7.7 K/uL   Lymphocytes Relative 3 %   Lymphs Abs 0.5 (L) 0.7 - 4.0 K/uL   Monocytes Relative 10 %   Monocytes Absolute 1.5 (H) 0.1 - 1.0 K/uL   Eosinophils Relative 0 %   Eosinophils Absolute 0.0 0.0 - 0.5 K/uL   Basophils Relative 0 %   Basophils Absolute 0.1 0.0 - 0.1 K/uL   Immature  Granulocytes 3 %   Abs Immature Granulocytes 0.42 (H) 0.00 - 0.07 K/uL    Comment: Performed at [facility], [address], [place], [place] [address]  Comprehensive metabolic panel     Status: Abnormal   Collection Time: 10/08/23 11:48 PM  Result Value Ref Range   Sodium 130 (L) 135 - 145 mmol/L   Potassium 4.1 3.5 - 5.1 mmol/L   Chloride 98 98 - 111 mmol/L   CO2 17 (L) 22 - 32 mmol/L   Glucose, Bld 132 (H) 70 - 99 mg/dL    Comment: Glucose reference range applies only to samples taken after fasting for at least 8 hours.   BUN 49 (H) 8 - 23 mg/dL   Creatinine, Ser 3.66 (H) 0.61 - 1.24 mg/dL   Calcium 7.8 (L) 8.9 - 10.3 mg/dL   Total Protein 5.1 (L) 6.5 - 8.1 g/dL   Albumin 1.9 (L) 3.5 - 5.0 g/dL   AST 33 15 - 41 U/L   ALT 31 0 - 44 U/L   Alkaline Phosphatase 135 (H) 38 - 126 U/L   Total Bilirubin 0.4 0.3 - 1.2 mg/dL   GFR, Estimated 33 (L) >60 mL/min    Comment: (NOTE) Calculated using the CKD-EPI Creatinine Equation (2021)    Anion gap 15 5 - 15    Comment: Performed at [facility], [address] [place], [place] [address]  Lactate dehydrogenase     Status: None   Collection Time: 10/08/23 11:48 PM  Result Value Ref Range   LDH 114 98 - 192 U/L    Comment: Performed at [facility], [address] [place], [place] [address]  Brain natriuretic peptide     Status: Abnormal    Collection Time: 10/08/23 11:48 PM  Result Value Ref Range   B Natriuretic Peptide 248.5 (H) 0.0 - 100.0 pg/mL    Comment: Performed at [facility], [address] [place], [place] [address]  Troponin I (High Sensitivity)     Status: None   Collection Time: 10/08/23 11:48 PM  Result Value Ref Range   Troponin I (High Sensitivity) 17 <18 ng/L    Comment: (NOTE) Elevated high sensitivity troponin I (hsTnI) values and significant  changes across serial measurements may suggest ACS but many other  chronic and acute conditions are known to elevate hsTnI results.  Refer to the "Links" section for chest pain algorithms and additional  guidance. Performed at [facility], [address], [place], [place] [address]   Protime-INR     Status: Abnormal   Collection Time: 10/08/23 11:48 PM  Result Value Ref Range   Prothrombin Time 18.9 (H) 11.4 - 15.2 seconds   INR 1.6 (H) 0.8 - 1.2    Comment: (NOTE) INR goal varies based on device and disease states. Performed at [facility], [address], [place], [place] [address]    DG Chest Portable 1 View  Result Date: 10/09/2023 CLINICAL DATA:  Shortness of breath EXAM: PORTABLE CHEST 1 VIEW COMPARISON:  Chest x-ray 09/23/2023 FINDINGS: LVAD again noted. Sternotomy wires are present. The heart is enlarged, unchanged. There is no focal lung infiltrate, pleural effusion or pneumothorax. There surgical clips in the bilateral axillary regions. There is a healed left clavicular fracture. No acute fractures are seen. IMPRESSION: 1. No active disease. 2. Cardiomegaly. Electronically Signed   By: Darliss Cheney M.D.   On: 10/09/2023 01:02    Pending Labs Unresulted  Labs (From admission, onward)     Start     Ordered   10/09/23 0116  Blood culture (routine x 2)  BLOOD CULTURE X 2,   R (with STAT occurrences)     Question Answer Comment  Patient immune status Normal   Release to patient Immediate      10/09/23 0115   10/09/23 0116   Procalcitonin  Once,   URGENT       References:    Procalcitonin Lower Respiratory Tract Infection AND Sepsis Procalcitonin Algorithm   10/09/23 0115            Vitals/Pain Today's Vitals   10/08/23 2345 10/09/23 0030 10/09/23 0045 10/09/23 0100  BP: (!) 84/59 (!) 78/67 111/84 (!) 83/69  Pulse: (!) 102 97  96  Resp: (!) 23 10  (!) 24  Temp:      TempSrc:      SpO2: 99% 99%  96%  Weight:      Height:      PainSc:        Isolation Precautions No active isolations  Medications Medications  iohexol (OMNIPAQUE) 350 MG/ML injection 75 mL (75 mLs Intravenous Contrast Given 10/09/23 0033)    Mobility walks     Focused Assessments Cardiac Assessment Handoff:  Cardiac Rhythm: Other (Comment) (LVAD) Lab Results  Component Value Date   CKTOTAL 33 (L) 09/25/2023   Lab Results  Component Value Date   DDIMER 2.01 (H) 11/17/2020   Does the Patient currently have chest pain? No    R Recommendations: See Admitting Provider Note  Report given to:   Additional Notes: Pt A&Ox4, skin pale, pulses weak, can stand and pivot, chest pain only with coughing, has LVAD.

## 2023-10-10 ENCOUNTER — Other Ambulatory Visit: Payer: Self-pay

## 2023-10-10 DIAGNOSIS — T827XXA Infection and inflammatory reaction due to other cardiac and vascular devices, implants and grafts, initial encounter: Secondary | ICD-10-CM | POA: Diagnosis not present

## 2023-10-10 LAB — GLUCOSE, CAPILLARY
Glucose-Capillary: 244 mg/dL — ABNORMAL HIGH (ref 70–99)
Glucose-Capillary: 126 mg/dL — ABNORMAL HIGH (ref 70–99)
Glucose-Capillary: 195 mg/dL — ABNORMAL HIGH (ref 70–99)
Glucose-Capillary: 191 mg/dL — ABNORMAL HIGH (ref 70–99)

## 2023-10-10 LAB — BLOOD CULTURE ID PANEL (REFLEXED) - BCID2

## 2023-10-10 LAB — BASIC METABOLIC PANEL
Anion gap: 10 (ref 5–15)
BUN: 39 mg/dL — ABNORMAL HIGH (ref 8–23)
CO2: 21 mmol/L — ABNORMAL LOW (ref 22–32)
Calcium: 7.9 mg/dL — ABNORMAL LOW (ref 8.9–10.3)
Chloride: 101 mmol/L (ref 98–111)
Creatinine, Ser: 1.79 mg/dL — ABNORMAL HIGH (ref 0.61–1.24)
GFR, Estimated: 42 mL/min — ABNORMAL LOW (ref >60)
Glucose, Bld: 128 mg/dL — ABNORMAL HIGH (ref 70–99)
Potassium: 4 mmol/L (ref 3.5–5.1)
Sodium: 132 mmol/L — ABNORMAL LOW (ref 135–145)

## 2023-10-10 LAB — PROTIME-INR
INR: 1.9 — ABNORMAL HIGH (ref 0.8–1.2)
Prothrombin Time: 22.3 s — ABNORMAL HIGH (ref 11.4–15.2)

## 2023-10-10 LAB — CBC
HCT: 25.4 % — ABNORMAL LOW (ref 39.0–52.0)
Hemoglobin: 8.4 g/dL — ABNORMAL LOW (ref 13.0–17.0)
MCH: 26.2 pg (ref 26.0–34.0)
MCHC: 33.1 g/dL (ref 30.0–36.0)
MCV: 79.1 fL — ABNORMAL LOW (ref 80.0–100.0)
Platelets: 229 10*3/uL (ref 150–400)
RBC: 3.21 MIL/uL — ABNORMAL LOW (ref 4.22–5.81)
RDW: 15 % (ref 11.5–15.5)
WBC: 11.7 10*3/uL — ABNORMAL HIGH (ref 4.0–10.5)
nRBC: 0 % (ref 0.0–0.2)

## 2023-10-10 LAB — LACTATE DEHYDROGENASE: LDH: 135 U/L (ref 98–192)

## 2023-10-10 LAB — MAGNESIUM: Magnesium: 2 mg/dL (ref 1.7–2.4)

## 2023-10-10 MED ORDER — WARFARIN SODIUM 4 MG PO TABS
4.0000 mg | ORAL_TABLET | Freq: Once | ORAL | Status: AC
  Administered 2023-10-10: 4 mg via ORAL
  Filled 2023-10-10: qty 1

## 2023-10-10 NOTE — TOC CM/SW Note (Signed)
..      Durable Medical Equipment  (From admission, onward)           Start     Ordered   10/10/23 1012  For home use only DME Hospital bed  Once       Comments: Therapeutic Mattress  Question Answer Comment  Length of Need Lifetime   Patient has (list medical condition): Heart Failure, LVAD, COPD   The above medical condition requires: Patient requires the ability to reposition immediately   Head must be elevated greater than: 45 degrees   Bed type Semi-electric      10/10/23 1012

## 2023-10-10 NOTE — Plan of Care (Signed)
  Problem: Education: Goal: Patient will understand all VAD equipment and how it functions Outcome: Progressing Goal: Patient will be able to verbalize current INR target range and antiplatelet therapy for discharge home Outcome: Progressing   Problem: Cardiac: Goal: LVAD will function as expected and patient will experience no clinical alarms Outcome: Progressing   Problem: Education: Goal: Knowledge of General Education information will improve Description: Including pain rating scale, medication(s)/side effects and non-pharmacologic comfort measures Outcome: Progressing   Problem: Health Behavior/Discharge Planning: Goal: Ability to manage health-related needs will improve Outcome: Progressing   Problem: Clinical Measurements: Goal: Ability to maintain clinical measurements within normal limits will improve Outcome: Progressing Goal: Will remain free from infection Outcome: Progressing Goal: Diagnostic test results will improve Outcome: Progressing Goal: Respiratory complications will improve Outcome: Progressing Goal: Cardiovascular complication will be avoided Outcome: Progressing   Problem: Activity: Goal: Risk for activity intolerance will decrease Outcome: Progressing   Problem: Nutrition: Goal: Adequate nutrition will be maintained Outcome: Progressing   Problem: Coping: Goal: Level of anxiety will decrease Outcome: Progressing   Problem: Elimination: Goal: Will not experience complications related to bowel motility Outcome: Progressing Goal: Will not experience complications related to urinary retention Outcome: Progressing   Problem: Pain Management: Goal: General experience of comfort will improve Outcome: Progressing   Problem: Safety: Goal: Ability to remain free from injury will improve Outcome: Progressing   Problem: Skin Integrity: Goal: Risk for impaired skin integrity will decrease Outcome: Progressing   Problem: Education: Goal:  Ability to describe self-care measures that may prevent or decrease complications (Diabetes Survival Skills Education) will improve Outcome: Progressing Goal: Individualized Educational Video(s) Outcome: Progressing   Problem: Coping: Goal: Ability to adjust to condition or change in health will improve Outcome: Progressing   Problem: Fluid Volume: Goal: Ability to maintain a balanced intake and output will improve Outcome: Progressing   Problem: Health Behavior/Discharge Planning: Goal: Ability to identify and utilize available resources and services will improve Outcome: Progressing Goal: Ability to manage health-related needs will improve Outcome: Progressing   Problem: Metabolic: Goal: Ability to maintain appropriate glucose levels will improve Outcome: Progressing   Problem: Nutritional: Goal: Maintenance of adequate nutrition will improve Outcome: Progressing Goal: Progress toward achieving an optimal weight will improve Outcome: Progressing   Problem: Skin Integrity: Goal: Risk for impaired skin integrity will decrease Outcome: Progressing   Problem: Tissue Perfusion: Goal: Adequacy of tissue perfusion will improve Outcome: Progressing

## 2023-10-10 NOTE — Progress Notes (Signed)
PHARMACY - PHYSICIAN COMMUNICATION CRITICAL VALUE ALERT - BLOOD CULTURE IDENTIFICATION (BCID)  Kevin Eguia. is an 62 y.o. male who presented to [facility] on 10/08[facility]2023 with a chief complaint of LVAD HM3 driveline infection.   Blood cultures with pseudomonas. Pt has history of MRSA and pseudomonas driveline infections.   Name of physician (or Provider) Contacted: Day team to discuss with HF team  Current antibiotics: Daptomycin and Cefepime  Changes to prescribed antibiotics recommended:  No changes  Results for orders placed or performed during the hospital encounter of 10/08/23  Blood Culture ID Panel (Reflexed) (Collected: 10/09/2023  1:34 AM)  Result Value Ref Range   Enterococcus faecalis NOT DETECTED NOT DETECTED   Enterococcus Faecium NOT DETECTED NOT DETECTED   Listeria monocytogenes NOT DETECTED NOT DETECTED   Staphylococcus species NOT DETECTED NOT DETECTED   Staphylococcus aureus (BCID) NOT DETECTED NOT DETECTED   Staphylococcus epidermidis NOT DETECTED NOT DETECTED   Staphylococcus lugdunensis NOT DETECTED NOT DETECTED   Streptococcus species NOT DETECTED NOT DETECTED   Streptococcus agalactiae NOT DETECTED NOT DETECTED   Streptococcus pneumoniae NOT DETECTED NOT DETECTED   Streptococcus pyogenes NOT DETECTED NOT DETECTED   A.calcoaceticus-baumannii NOT DETECTED NOT DETECTED   Bacteroides fragilis NOT DETECTED NOT DETECTED   Enterobacterales NOT DETECTED NOT DETECTED   Enterobacter cloacae complex NOT DETECTED NOT DETECTED   Escherichia coli NOT DETECTED NOT DETECTED   Klebsiella aerogenes NOT DETECTED NOT DETECTED   Klebsiella oxytoca NOT DETECTED NOT DETECTED   Klebsiella pneumoniae NOT DETECTED NOT DETECTED   Proteus species NOT DETECTED NOT DETECTED   Salmonella species NOT DETECTED NOT DETECTED   Serratia marcescens NOT DETECTED NOT DETECTED   Haemophilus influenzae NOT DETECTED NOT DETECTED   Neisseria meningitidis NOT DETECTED NOT DETECTED    Pseudomonas aeruginosa DETECTED (A) NOT DETECTED   Stenotrophomonas maltophilia NOT DETECTED NOT DETECTED   Candida albicans NOT DETECTED NOT DETECTED   Candida auris NOT DETECTED NOT DETECTED   Candida glabrata NOT DETECTED NOT DETECTED   Candida krusei NOT DETECTED NOT DETECTED   Candida parapsilosis NOT DETECTED NOT DETECTED   Candida tropicalis NOT DETECTED NOT DETECTED   Cryptococcus neoformans/gattii NOT DETECTED NOT DETECTED   CTX-M ESBL NOT DETECTED NOT DETECTED   Carbapenem resistance IMP NOT DETECTED NOT DETECTED   Carbapenem resistance KPC NOT DETECTED NOT DETECTED   Carbapenem resistance NDM NOT DETECTED NOT DETECTED   Carbapenem resistance VIM NOT DETECTED NOT DETECTED    Abran Duke, PharmD, BCPS Clinical Pharmacist Phone: [phone]

## 2023-10-10 NOTE — TOC Initial Note (Addendum)
Transition of Care (TOC) - Initial/Assessment Note    Patient Details  Name: Kevin Underwood. MRN: [id number] Date of Birth: 1961/01/18  Transition of Care [facility]) CM/SW Contact:    [name], RN Phone Number: [phone] 10/10/2023, 9:32 AM  Clinical Narrative:    Readmission from home with wife, [name]. Pt/wife is requesting assistance at home. Offered choice for [facility], pt agreeable to [facility] (medicare.gov list placed on chart and given to patient). Will need HHRN and HH PT orders with F2F. Discussed [place] Medicaid PCS aide, wife states help is needed in home. Will complete [place] [employer] PCS form, have attending sign and fax to [facility] [employer]. Wife talked about hospital bed. Will discuss follow for DME needs.      Patient does want hospital bed. Contacted [name] rep, [name] for possible IV abx at home.             Expected Discharge Plan: Home w Home Health Services Barriers to Discharge: Continued Medical Work up   Patient Goals and CMS Choice Patient states their goals for this hospitalization and ongoing recovery are:: would like assistance at home CMS Medicare.gov Compare Post Acute Care list provided to:: Patient Choice offered to / list presented to : Patient      Expected Discharge Plan and Services   Discharge Planning Services: CM Consult Post Acute Care Choice: Home Health Living arrangements for the past 2 months: Single Family Home                           HH Arranged: RN, PT HH Agency: [facility] Date [facility] Agency Contacted: 10/10/23 Time HH Agency Contacted: [phone] Representative spoke with at [facility] Agency: Lorenza Chick  Prior Living Arrangements/Services Living arrangements for the past 2 months: Single Family Home Lives with:: Spouse Patient language and need for interpreter reviewed:: Yes Do you feel safe going back to the place where you live?: Yes      Need for Family Participation in Patient Care: Yes (Comment) Care  giver support system in place?: Yes (comment) Current home services: DME (LVAD, scale, nebulizer) Criminal Activity/Legal Involvement Pertinent to Current Situation/Hospitalization: No - Comment as needed  Activities of Daily Living   ADL Screening (condition at time of admission) Independently performs ADLs?: Yes (appropriate for developmental age) Does the patient have a NEW difficulty with bathing/dressing/toileting/self-feeding that is expected to last >3 days?: No Does the patient have a NEW difficulty with getting in/out of bed, walking, or climbing stairs that is expected to last >3 days?: No Does the patient have a NEW difficulty with communication that is expected to last >3 days?: No Is the patient deaf or have difficulty hearing?: No Does the patient have difficulty seeing, even when wearing glasses/contacts?: No Does the patient have difficulty concentrating, remembering, or making decisions?: No  Permission Sought/Granted Permission sought to share information with : Case Manager, Family Supports, PCP Permission granted to share information with : Yes, Verbal Permission Granted  Share Information with NAME: Tyan Dy  Permission granted to share info w AGENCY: Home Health  Permission granted to share info w Relationship: wife  Permission granted to share info w Contact Information: [phone]  Emotional Assessment Appearance:: Appears older than stated age Attitude/Demeanor/Rapport: Engaged Affect (typically observed): Appropriate Orientation: : Oriented to Self, Oriented to Place, Oriented to  Time, Oriented to Situation Alcohol / Substance Use: Not Applicable Psych Involvement: No (  comment)  Admission diagnosis:  Shortness of breath [R06.02] AKI (acute kidney injury) (HCC) [N17.9] LVAD (left ventricular assist device) present (HCC) [Z95.811] 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) [T82.7XXA] Infection associated with  driveline of left ventricular assist device (LVAD) (HCC) [Z61.7XXA] Patient Active Problem List   Diagnosis Date Noted   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) 10/09/2023   Protein-calorie malnutrition, severe 10/02/2023   Abdominal pain 09/23/2023   Sepsis (HCC) 09/08/2023   Stimulant use disorder 02/09/2023   Depression 02/09/2023   Pain and swelling of right wrist 09/30/2022   Hypotension 08/07/2022   MRSA infection    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 (LVAD) (HCC)    Left ventricular assist device (LVAD) complication, subsequent encounter 05/13/2022   AKI (acute kidney injury) (HCC) 05/13/2022   Hyperthyroidism 01/22/2022   Chronic systolic heart failure (HCC)    COPD (chronic obstructive pulmonary disease) (HCC) 09/09/2021   Malnutrition of moderate degree 11/17/2020   CHF (congestive heart failure), NYHA class IV, chronic, systolic (HCC) 11/17/2020   Palliative care by specialist    Goals of care, counseling/discussion    Hyperlipidemia    CHF exacerbation (HCC) 09/23/2020   Unstable angina (HCC) 09/22/2020   LVAD (left ventricular assist device) present (HCC)    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   Cardiogenic shock (HCC)    Acute respiratory failure (HCC)    RUQ abdominal pain    Cardiomyopathy (HCC) 11/21/2019   Acute systolic CHF (congestive heart failure) (HCC) 11/21/2019   History of alcohol abuse Quit 6 mos ago 11/21/2019   History of recreational drug use 11/21/2019   PAF (paroxysmal atrial fibrillation) (HCC) 11/21/2019   Cardiac volume overload 11/21/2019   Elevated brain natriuretic peptide (BNP) level 11/21/2019   Leg edema, right 11/21/2019   Hyponatremia 11/21/2019   Abnormal electrocardiogram (ECG) (EKG) 11/21/2019   Hyperglycemia 11/21/2019   Tremulousness 11/21/2019   Restlessness and agitation 11/21/2019   OSA (obstructive sleep apnea) 11/21/2019   Atrial fibrillation with RVR (HCC)  11/20/2019   Acute congestive heart failure (HCC) 11/20/2019   Acute respiratory distress 11/20/2019   COPD with acute exacerbation (HCC)    Hypertension    Type 2 diabetes mellitus (HCC)    Closed fracture of 5th metacarpal 08/31/2012   PCP:  [name], FNP Pharmacy:   [id number] - [place], [place] - [address] [facility] [address] [place] [address] Phone: [phone] Fax: [phone]  [id number] - [place], [place] - [address] [place] [address] Phone: [phone] Fax: [phone]     Social Determinants of Health (SDOH) Social History: SDOH Screenings   Food Insecurity: No Food Insecurity (10/09/2023)  Housing: Low Risk  (10/09/2023)  Transportation Needs: No Transportation Needs (10/09/2023)  Recent Concern: Transportation Needs - Unmet Transportation Needs (09/08/2023)  Utilities: Not At Risk (10/09/2023)  Alcohol Screen: Low Risk  (11/10/2020)  Depression (PHQ2-9): Low Risk  (10/16/2022)  Financial Resource Strain: Medium Risk (05/17/2022)  Physical Activity: Inactive (11/10/2020)  Tobacco Use: Medium Risk (10/08/2023)   SDOH Interventions:     Readmission Risk Interventions    07/01/2022    9:05 AM  Readmission Risk Prevention Plan  Transportation Screening Complete  Medication Review (RN Care Manager) Complete  PCP or Specialist appointment within 3-5 days of discharge Complete  HRI or Home Care Consult Complete  SW Recovery Care/Counseling  Consult Complete  Palliative Care Screening Not Applicable  Skilled Nursing Facility Not Applicable

## 2023-10-10 NOTE — Progress Notes (Signed)
PHARMACY - ANTICOAGULATION CONSULT NOTE  Pharmacy Consult for warfarin Indication:  LVAD HM3  Allergies  Allergen Reactions   Firvanq [Vancomycin] Nausea And Vomiting and Other (See Comments)    Arthralgias     Patient Measurements: Height: 5\' 10"  (177.8 cm) Weight: 84.1 kg (185 lb 8 oz) IBW/kg (Calculated) : 73 Heparin Dosing Weight: 77.1 kg   Vital Signs: Temp: 97.7 F (36.5 C) (11/01 0801) Temp Source: Oral (11/01 0801) BP: 98/77 (11/01 0801) Pulse Rate: 80 (11/01 0801)  Labs: Recent Labs    10/08/23 2348 10/09/23 0137 10/09/23 1227  HGB 8.1*  --  9.0*  HCT 24.8*  --  27.9*  PLT 162  --  197  LABPROT 18.9*  --  20.5*  INR 1.6*  --  1.7*  CREATININE 2.22*  --  2.09*  TROPONINIHS 17 16  --     Estimated Creatinine Clearance: 37.8 mL/min (A) (by C-G formula based on SCr of 2.09 mg/dL (H)).   Medical History: Past Medical History:  Diagnosis Date   "    Arthritis    CAD (coronary artery disease)    a. s/p CABG in 11/2019 with LIMA-LAD, SVG-OM1, SVG-PDA and SVG-D1   CHF (congestive heart failure) (HCC)    a. EF < 20% by echo in 11/2019   Essential hypertension    PAF (paroxysmal atrial fibrillation) (HCC)    Type 2 diabetes mellitus (HCC)     Medications:  Scheduled:   HYDROmorphone HCl  8 mg Oral Q24H   insulin aspart  0-15 Units Subcutaneous TID WC   insulin aspart  0-5 Units Subcutaneous QHS   metoprolol succinate  25 mg Oral Daily   minocycline  200 mg Oral BID   multivitamin with minerals  1 tablet Oral Daily   pantoprazole  40 mg Oral Daily   sertraline  50 mg Oral QHS   Warfarin - Pharmacist Dosing Inpatient   Does not apply q1600    Assessment: 62 yom presenting with SOB and cough - on warfarin PTA for hx LVAD HM3 (LD 10/30).  PTA regimen is 4 mg daily except 6 mg Wed.   INR this morning came back subtherapeutic at 1.9. Hgb 8.4, plt 229. LDH stable at 135.  Goal of Therapy:  INR 2-2.5 Monitor platelets by anticoagulation protocol:  Yes   Plan:  Order warfarin 4 tonight (back to PTA regimen) Monitor daily INR, CBC, and for s/sx of bleeding   Thank you for allowing pharmacy to participate in this patient's care,  Sherron Monday, PharmD, BCCCP Clinical Pharmacist  Phone: [phone] 10/10/2023 8:08 AM  Please check AMION for all [facility] Pharm[facility] phone numbers After 10:00 PM, call Main Pharmacy [phone]

## 2023-10-10 NOTE — Plan of Care (Signed)
  Problem: Education: Goal: Patient will understand all VAD equipment and how it functions Outcome: Progressing   Problem: Cardiac: Goal: LVAD will function as expected and patient will experience no clinical alarms Outcome: Progressing   Problem: Education: Goal: Knowledge of General Education information will improve Description: Including pain rating scale, medication(s)/side effects and non-pharmacologic comfort measures Outcome: Progressing   Problem: Health Behavior/Discharge Planning: Goal: Ability to manage health-related needs will improve Outcome: Progressing   Problem: Clinical Measurements: Goal: Diagnostic test results will improve Outcome: Progressing   Problem: Nutrition: Goal: Adequate nutrition will be maintained Outcome: Progressing   Problem: Elimination: Goal: Will not experience complications related to bowel motility Outcome: Progressing Goal: Will not experience complications related to urinary retention Outcome: Progressing   Problem: Pain Management: Goal: General experience of comfort will improve Outcome: Progressing   Problem: Safety: Goal: Ability to remain free from injury will improve Outcome: Progressing

## 2023-10-10 NOTE — Progress Notes (Signed)
LVAD Coordinator Rounding Note:  Admitted 10/09/23 to Heart Failure service from [facility] with worsening back pain and pt unable to eat or drink for 3-4 days.   CT abd/pelvis 10/09/23 1. No evidence of acute traumatic injury in the chest, abdomen, or pelvis. 2. LVAD is in unchanged in configuration compared with 09/08/2023. Similar thickening about the drive line. 3. Emphysema and chronic bronchitis. 4. Hepatic steatosis. 5. Nonobstructing left nephrolithiasis. 6. Large broad-based anterior abdominal wall hernia containing nonobstructed transverse colon and small bowel.  Pt is feeling much better today. Pain improved. Fatigue much better. Pt physically looks much better than yesterday. BC + for pseudomonas. Pt would benefit from home IV antibiotics. He has been drug free and complaint in the past few months. We had a long discussion about his situation this morning. Pt agrees to continue to be compliant with treatment to abstain from drug use. Pt on Cefepime and Daptomycin currently.   Palliative care consult has been placed by provider team. His weight is down almost 20 lbs from his discharge weight on 10/24.  Vital signs: Temp: 97.7 HR: 85 Doppler Pressure: 90 Automatic BP: 98/77 (85) O2 Sat: 95% on RA Wt: 178>185.5 lbs  LVAD interrogation reveals:  Speed: 5600 Flow: 4.4 Power: 4.2 w PI: 2.8  Alarms: none Events: 75 PI events today Hct: 28  Fixed speed:  5600 Low speed limit:  5300  Drive Line: Existing VAD dressing removed and site care performed using sterile technique. Wound bed cleansed with VASHE solution. Skin surrounding wound bed cleaned with Chlora prep applicators x 2, allowed to dry. Followed by VASHE soaked 2x2 flush to exit site and then covered with several dry 4 x 4s. Drive line partially incorporated. Driveline tunnels approx 5 cm. Moderate amount of thick yellow/serosanguinous drainage noted. No redness, tenderness, foul odor or rash noted. Ulcerated area distal to  drive line cleansed with VASHE solution. VASHE moistened 2 x 2 placed over area and covered with gauze. Entire area covered with 2 large tegaderms. Drive line anchor reapplied. Continue daily wet to dry dressing changes using VASHE solution. Next dressing change due 10/11/23.       Pt had previous drive line debridement in OR with placement of instillation wound vac on 07/09/23 with Dr Donata Clay. This was changed to wet/dry on Friday 8/2. No further debridement of the VAD tunnel would be possible without injury to the outflow graft superiorly or the peritoneal cavity inferior to the power cord per Dr Donata Clay.   Labs:  LDH trend: 141  INR trend: 1.6>1.7  WBC trend: 15.4>19.6  Anticoagulation Plan: -INR Goal: 2.0 - 2.5 -ASA Dose: none  ICD: N/A  Infection:  10/09/23>> blood cxs>>  PSEUDOMONAS AERUGINOSA   Drips:   Plan/Recommendations:  Page VAD coordinator with equipment issues or driveline problems Daily drive line wet to dry dressing changes with VASHE solution per bedside RN.   Carlton Adam RN,BSN VAD Coordinator  Office: [phone]  24/7 Pager: [phone]

## 2023-10-10 NOTE — Progress Notes (Addendum)
Advanced Heart Failure VAD Team Note  PCP-Cardiologist: Nona Dell, MD   Subjective:    Remains on IV Dapto + IV Cefepime and po minocycline with concern for recurrent deep infection.  BC 10/31 grew Pseudomonas, sensitivities pending  Afebrile. Labs pending.   Pain better controlled. Gets full quickly when he tries to eat   LVAD INTERROGATION:  HeartMate III LVAD:   Flow 4.5 liters/min, speed 5600, power 4, PI 2.9.  27 PI events so far this am, several drops in speed to low limit  Objective:    Vital Signs:   Temp:  [97.5 F (36.4 C)-97.7 F (36.5 C)] 97.6 F (36.4 C) (11/01 0339) Pulse Rate:  [58-87] 58 (11/01 0339) Resp:  [11-21] 14 (11/01 0339) BP: (81-135)/(67-106) 81/70 (11/01 0339) SpO2:  [95 %-99 %] 98 % (11/01 0028) Weight:  [84.1 kg] 84.1 kg (11/01 0416) Last BM Date : 10/07/23 Mean arterial Pressure 70s-80s  Intake/Output:   Intake/Output Summary (Last 24 hours) at 10/10/2023 0704 Last data filed at 10/10/2023 0500 Gross per 24 hour  Intake 1417.36 ml  Output 1300 ml  Net 117.36 ml     Physical Exam    General:  Fatigued, chronically ill appearing HEENT: normal Neck: supple. JVP flat.  Cor: Mechanical heart sounds with LVAD hum present. Lungs: clear Abdomen: soft, nontender, nondistended. Large abdominal hernia Driveline: C/D/I; securement device intact and driveline incorporated Extremities: no cyanosis, clubbing, rash, edema Neuro: alert & orientedx3. Affect pleasant   Telemetry   SR 70s  Labs   Basic Metabolic Panel: Recent Labs  Lab 10/08/23 2348 10/09/23 1227  NA 130* 133*  K 4.1 4.1  CL 98 98  CO2 17* 21*  GLUCOSE 132* 159*  BUN 49* 44*  CREATININE 2.22* 2.09*  CALCIUM 7.8* 8.4*    Liver Function Tests: Recent Labs  Lab 10/08/23 2348  AST 33  ALT 31  ALKPHOS 135*  BILITOT 0.4  PROT 5.1*  ALBUMIN 1.9*   No results for input(s): "LIPASE", "AMYLASE" in the last 168 hours. No results for input(s): "AMMONIA" in  the last 168 hours.  CBC: Recent Labs  Lab 10/08/23 2348 10/09/23 1227  WBC 15.4* 19.6*  NEUTROABS 12.9*  --   HGB 8.1* 9.0*  HCT 24.8* 27.9*  MCV 78.0* 78.2*  PLT 162 197    INR: Recent Labs  Lab 10/08/23 2348 10/09/23 1227  INR 1.6* 1.7*    Other results: EKG:    Imaging   CT CHEST ABDOMEN PELVIS W CONTRAST  Result Date: 10/09/2023 CLINICAL DATA:  LVAD patient.  Blunt polytrauma. EXAM: CT CHEST, ABDOMEN, AND PELVIS WITH CONTRAST TECHNIQUE: Multidetector CT imaging of the chest, abdomen and pelvis was performed following the standard protocol during bolus administration of intravenous contrast. RADIATION DOSE REDUCTION: This exam was performed according to the departmental dose-optimization program which includes automated exposure control, adjustment of the mA and/or kV according to patient size and/or use of iterative reconstruction technique. CONTRAST:  75mL OMNIPAQUE IOHEXOL 350 MG/ML SOLN COMPARISON:  Chest radiograph 10/08/2023; CT abdomen and pelvis 09/23/2023 and CT chest abdomen and pelvis 09/08/2023 FINDINGS: CT CHEST FINDINGS Cardiovascular: LVAD is in unchanged configuration compared with 09/08/2023. Similar soft tissue thickening about the drive line. No fluid collection. No pericardial effusion. Coronary artery and aortic atherosclerotic calcification. No evidence of acute aortic injury. Left atrial appendage occlusion device. Mediastinum/Nodes: Trachea and esophagus are unremarkable. No mediastinal hematoma. Lungs/Pleura: Emphysema. Bronchial wall thickening greatest in the lower lobes. Bibasilar atelectasis/scarring. No pneumothorax. Musculoskeletal: Sternotomy.  No acute fracture. CT ABDOMEN PELVIS FINDINGS Hepatobiliary: Hepatic steatosis. Unremarkable gallbladder and biliary tree. Pancreas: Fatty atrophy without acute abnormality. Spleen: Unremarkable. Adrenals/Urinary Tract: Normal adrenal glands. Streak artifact obscures a portion of the mid right ureter. No  hydronephrosis. Nonobstructing left nephrolithiasis. Unremarkable bladder. Stomach/Bowel: Normal caliber large and small bowel. No bowel wall thickening. The appendix is not visualized. The transverse colon and a loop of small bowel herniate into the large anterior abdominal wall hernia. Vascular/Lymphatic: Aortic atherosclerosis. No enlarged abdominal or pelvic lymph nodes. Reproductive: Unremarkable. Other: No free intraperitoneal fluid or air. Musculoskeletal: Posterior fusion L4-S1.  No acute fracture. IMPRESSION: 1. No evidence of acute traumatic injury in the chest, abdomen, or pelvis. 2. LVAD is in unchanged in configuration compared with 09/08/2023. Similar thickening about the drive line. 3. Emphysema and chronic bronchitis. 4. Hepatic steatosis. 5. Nonobstructing left nephrolithiasis. 6. Large broad-based anterior abdominal wall hernia containing nonobstructed transverse colon and small bowel. Aortic Atherosclerosis (ICD10-I70.0) and Emphysema (ICD10-J43.9). Electronically Signed   By: Minerva Fester M.D.   On: 10/09/2023 03:12   DG Chest Portable 1 View  Result Date: 10/09/2023 CLINICAL DATA:  Shortness of breath EXAM: PORTABLE CHEST 1 VIEW COMPARISON:  Chest x-ray 09/23/2023 FINDINGS: LVAD again noted. Sternotomy wires are present. The heart is enlarged, unchanged. There is no focal lung infiltrate, pleural effusion or pneumothorax. There surgical clips in the bilateral axillary regions. There is a healed left clavicular fracture. No acute fractures are seen. IMPRESSION: 1. No active disease. 2. Cardiomegaly. Electronically Signed   By: Darliss Cheney M.D.   On: 10/09/2023 01:02     Medications:     Scheduled Medications:  HYDROmorphone HCl  8 mg Oral Q24H   insulin aspart  0-15 Units Subcutaneous TID WC   insulin aspart  0-5 Units Subcutaneous QHS   metoprolol succinate  25 mg Oral Daily   minocycline  200 mg Oral BID   multivitamin with minerals  1 tablet Oral Daily   pantoprazole  40  mg Oral Daily   sertraline  50 mg Oral QHS   Warfarin - Pharmacist Dosing Inpatient   Does not apply q1600    Infusions:  ceFEPime (MAXIPIME) IV 2 g (10/09/23 2129)   DAPTOmycin 700 mg (10/09/23 0456)   dextrose 5% lactated ringers 50 mL/hr at 10/09/23 1250    PRN Medications: acetaminophen, HYDROmorphone, ipratropium-albuterol, ondansetron (ZOFRAN) IV   Patient Profile  [name] Is a 62 y.o. male with history of systolic HF/ICM s/p HM III VAD, multivessel CAD status post CABG in December 2020 (with Maze and LAA clipping), paroxysmal atrial fibrillation, type 2 diabetes mellitus, COPD, and chronic drive line infection.   Presenting with back pain, shortness of breath, hypotension, and leukocytosis w/ concern for recurrent infection.    Assessment/Plan:   1. Recurrent driveline infection:  - MRSA driveline infection, admitted in 6/23 and again in 7/23-8/23 with multiple trips to the OR for debridement.  Cultures have grown acinetobacter, MRSA and pseudomonas He was readmitted with driveline infection in 2/24, completed course of daptomycin for MRSA. Discharged from the hospital on 09/04/2023 with recurrent driveline infection growing Pseudomonas. On chronic minocycline and ciprofloxacin at home. Wound culture 10/1 grew MRSA. Reports compliance with home regimen. - Recent admit 10/24 with severe abdominal pain, N, V d/t ileus and acute on chronic infection. Readmitted 10/08/23 with similar symptoms and low back pain.  Driveline appeared stable on CT A/P this admit. Suspect persistent indolent driveline infection. Unable to debride further  d/t proximity to VAD outflow graft. ? If infection possibly involving lower spine hardware from prior back fusion. Reports adherence with home antibiotics. - WBC 15.4>19.6K, procal 53. AF. AM labs pending. - Now on Cefepime and Dapto per ID, po minocycline  2. Pseudomonas bacteremia - Pseduomonas grew in 1 set of BC from 10/31 - Abx as  above - Will review regimen with ID   3.  Chronic systolic CHF s/p Heartmate 3 LVAD on 11/17/20.  - Echo 10/24: LVAD cannula in apex. Interventricular septum is midline. LVEF 20-25%, RV moderately reduced.   - INR goal 2 - 2.5. INR 1.7 yesterday. AM lab pending. Warfarin dosing per PharmD. - Volume looks okay. Encouraged po intake. Stop IVF this am. - Continue to hold Elberta Fortis today with AKI, repeat BMP today - Continue home metoprolol XL    4. 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  AKI in setting of infection  - Hold GDMT  - Repeat BMP today   5.  CAD  - S/p CABG 12/20 - Holding home Crestor while on Dapto   6.  Paroxysmal atrial fibrillation: - status post maze and left atrial appendage clip. NSR this admission. - Off amiodarone with hyperthyroidism.   7. Type 2 diabetes: -SSI   8. Hx methamphetamine abuse:  - no reported use   9. Hx Hyperthyroidism:   - Likely related to amiodarone. He is now off amiodarone and methimazole.    10. Anemia (chronic blood loss):   - Hgb 9.0 - Trend CBC    11. COPD:  - Prior smoker.  - On trelegy at home   12. Ventral hernia:  - Large ventral hernia, soft and nontender.  Does not appear incarcerated.  No bowel obstruction on CT this admit.  Not candidate for surgery due to proximity of hernia to driveline tunnel. Intolerant abdominal binder.    GOC:  - Seen by Palliative care, appreciate assistance.  - Code status - DNI, no cardioversion - He is contemplating hospice - Requesting hospital bed for home. Will involve HF TOC  I reviewed the LVAD parameters from today, and compared the results to the patient's prior recorded data.  No programming changes were made.  The LVAD is functioning within specified parameters.  The patient performs LVAD self-test daily.  LVAD interrogation was negative for any significant power changes, alarms or PI events/speed drops.  LVAD equipment  check completed and is in good working order.  Back-up equipment present.   LVAD education done on emergency procedures and precautions and reviewed exit site care.  Length of Stay: 1  Verdella Laidlaw N, PA-C 10/10/2023, 7:04 AM  VAD Team --- VAD ISSUES ONLY--- Pager [phone] (7am - 7am)  Advanced Heart Failure Team  Pager [phone] (M-F; 7a - 5p)  Please contact [facility] for night-coverage after hours (5p -7a ) and weekends on [email or web address]

## 2023-10-10 NOTE — Progress Notes (Signed)
  Subjective: Patient afebrile and feeling better today after starting on IV antibiotics.  Lower back pain has resolved.  General malaise improved along with appetite.  Blood cultures on admission positive for Pseudomonas.  WBC 19.6 yesterday, follow-up CBC pending today.  Objective: Vital signs in last 24 hours: Temp:  [97.6 F (36.4 C)-97.7 F (36.5 C)] 97.7 F (36.5 C) (11/01 0801) Pulse Rate:  [58-87] 80 (11/01 0801) Cardiac Rhythm: Normal sinus rhythm (11/01 0840) Resp:  [14-21] 19 (11/01 0801) BP: (81-135)/(67-106) 98/77 (11/01 0806) SpO2:  [96 %-99 %] 98 % (11/01 0028) Weight:  [84.1 kg] 84.1 kg (11/01 0416)  Hemodynamic parameters for last 24 hours:  Afebrile, normal sinus rhythm  Intake/Output from previous day: 10/31 0701 - 11/01 0700 In: 1417.4 [P.O.:460; I.V.:757.4; IV Piggyback:200] Out: 1300 [Urine:1300] Intake/Output this shift: Total I/O In: 120 [P.O.:120] Out: -   Exam Alert and appropriate Lungs clear, abdomen soft VAD tunnel dressing clean and dry Normal VAD hum on exam Neuro intact  Lab Results: Recent Labs    10/08/23 2348 10/09/23 1227  WBC 15.4* 19.6*  HGB 8.1* 9.0*  HCT 24.8* 27.9*  PLT 162 197   BMET:  Recent Labs    10/08/23 2348 10/09/23 1227  NA 130* 133*  K 4.1 4.1  CL 98 98  CO2 17* 21*  GLUCOSE 132* 159*  BUN 49* 44*  CREATININE 2.22* 2.09*  CALCIUM 7.8* 8.4*    PT/INR:  Recent Labs    10/09/23 1227  LABPROT 20.5*  INR 1.7*   ABG    Component Value Date/Time   PHART 7.508 (H) 11/20/2020 0438   HCO3 24.8 09/09/2021 0313   TCO2 21 (L) 09/23/2023 1219   ACIDBASEDEF 6.0 (H) 11/24/2019 1718   O2SAT 63.4 09/09/2021 0313   CBG (last 3)  Recent Labs    10/09/23 1707 10/09/23 2033 10/10/23 0633  GLUCAP 175* 149* 244*    Assessment/Plan: S/P  Improvement in symptoms after IV antibiotics started.  Pseudomonas positive blood culture on admission.  The patient will need several week course of IV antibiotic-since  his drug screen has been negative for the past few months and the patient has been very compliant as well, he should benefit from outpatient IV antibiotics through PICC line.  Continue to observe patient and clinical course over the weekend.   LOS: 1 day    Lovett Sox 10/10/2023

## 2023-10-11 ENCOUNTER — Inpatient Hospital Stay (HOSPITAL_COMMUNITY): Payer: 59

## 2023-10-11 DIAGNOSIS — I38 Endocarditis, valve unspecified: Secondary | ICD-10-CM

## 2023-10-11 DIAGNOSIS — T827XXA Infection and inflammatory reaction due to other cardiac and vascular devices, implants and grafts, initial encounter: Secondary | ICD-10-CM | POA: Diagnosis not present

## 2023-10-11 LAB — BASIC METABOLIC PANEL
Anion gap: 9 (ref 5–15)
BUN: 34 mg/dL — ABNORMAL HIGH (ref 8–23)
CO2: 22 mmol/L (ref 22–32)
Calcium: 8.4 mg/dL — ABNORMAL LOW (ref 8.9–10.3)
Chloride: 104 mmol/L (ref 98–111)
Creatinine, Ser: 1.67 mg/dL — ABNORMAL HIGH (ref 0.61–1.24)
GFR, Estimated: 46 mL/min — ABNORMAL LOW (ref >60)
Glucose, Bld: 181 mg/dL — ABNORMAL HIGH (ref 70–99)
Potassium: 4.1 mmol/L (ref 3.5–5.1)
Sodium: 135 mmol/L (ref 135–145)

## 2023-10-11 LAB — GLUCOSE, CAPILLARY
Glucose-Capillary: 182 mg/dL — ABNORMAL HIGH (ref 70–99)
Glucose-Capillary: 152 mg/dL — ABNORMAL HIGH (ref 70–99)
Glucose-Capillary: 228 mg/dL — ABNORMAL HIGH (ref 70–99)
Glucose-Capillary: 185 mg/dL — ABNORMAL HIGH (ref 70–99)

## 2023-10-11 LAB — LACTATE DEHYDROGENASE: LDH: 124 U/L (ref 98–192)

## 2023-10-11 LAB — ECHOCARDIOGRAM COMPLETE
Area-P 1/2: 4.49 cm2
S' Lateral: 3 cm

## 2023-10-11 LAB — MAGNESIUM: Magnesium: 1.8 mg/dL (ref 1.7–2.4)

## 2023-10-11 LAB — CBC
HCT: 28.2 % — ABNORMAL LOW (ref 39.0–52.0)
Hemoglobin: 9 g/dL — ABNORMAL LOW (ref 13.0–17.0)
MCH: 24.9 pg — ABNORMAL LOW (ref 26.0–34.0)
MCHC: 31.9 g/dL (ref 30.0–36.0)
MCV: 78.1 fL — ABNORMAL LOW (ref 80.0–100.0)
Platelets: 252 10*3/uL (ref 150–400)
RBC: 3.61 MIL/uL — ABNORMAL LOW (ref 4.22–5.81)
RDW: 15.1 % (ref 11.5–15.5)
WBC: 10.1 10*3/uL (ref 4.0–10.5)
nRBC: 0 % (ref 0.0–0.2)

## 2023-10-11 LAB — PROTIME-INR
INR: 2 — ABNORMAL HIGH (ref 0.8–1.2)
Prothrombin Time: 23.1 s — ABNORMAL HIGH (ref 11.4–15.2)

## 2023-10-11 MED ORDER — MAGNESIUM SULFATE 2 GM/50ML IV SOLN
2.0000 g | Freq: Once | INTRAVENOUS | Status: AC
  Administered 2023-10-11: 2 g via INTRAVENOUS
  Filled 2023-10-11: qty 50

## 2023-10-11 MED ORDER — SODIUM CHLORIDE 0.9% FLUSH: 10.0000 mL | INTRAVENOUS | Status: DC | PRN

## 2023-10-11 MED ORDER — AMLODIPINE BESYLATE 10 MG PO TABS
10.0000 mg | ORAL_TABLET | Freq: Every day | ORAL | Status: DC
  Administered 2023-10-11 – 2023-10-17 (×7): 10 mg via ORAL
  Filled 2023-10-11 (×7): qty 1

## 2023-10-11 MED ORDER — CHLORHEXIDINE GLUCONATE CLOTH 2 % EX PADS
6.0000 | MEDICATED_PAD | Freq: Every day | CUTANEOUS | Status: DC
  Administered 2023-10-11 – 2023-10-17 (×7): 6 via TOPICAL

## 2023-10-11 MED ORDER — WARFARIN SODIUM 5 MG PO TABS
5.0000 mg | ORAL_TABLET | Freq: Once | ORAL | Status: AC
  Administered 2023-10-11: 5 mg via ORAL
  Filled 2023-10-11: qty 1

## 2023-10-11 MED ORDER — HYDRALAZINE HCL 20 MG/ML IJ SOLN: 10.0000 mg | Freq: Four times a day (QID) | INTRAMUSCULAR | Status: DC | PRN

## 2023-10-11 MED ORDER — SODIUM CHLORIDE 0.9% FLUSH
10.0000 mL | Freq: Two times a day (BID) | INTRAVENOUS | Status: DC
  Administered 2023-10-11 – 2023-10-13 (×3): 10 mL
  Administered 2023-10-13: 40 mL
  Administered 2023-10-14 – 2023-10-17 (×4): 10 mL

## 2023-10-11 NOTE — Progress Notes (Signed)
Peripherally Inserted Central Catheter Placement  The IV Nurse has discussed with the patient and/or persons authorized to consent for the patient, the purpose of this procedure and the potential benefits and risks involved with this procedure.  The benefits include less needle sticks, lab draws from the catheter, and the patient may be discharged home with the catheter. Risks include, but not limited to, infection, bleeding, blood clot (thrombus formation), and puncture of an artery; nerve damage and irregular heartbeat and possibility to perform a PICC exchange if needed/ordered by physician.  Alternatives to this procedure were also discussed.  Bard Power PICC patient education guide, fact sheet on infection prevention and patient information card has been provided to patient /or left at bedside.    PICC Placement Documentation  PICC Single Lumen 10/11/23 Right Basilic 41 cm 0 cm (Active)  Indication for Insertion or Continuance of Line Home intravenous therapies (PICC only) 10/11/23 1137  Exposed Catheter (cm) 1 cm 10/11/23 1137  Site Assessment Clean, Dry, Intact 10/11/23 1137  Line Status Saline locked;Blood return noted 10/11/23 1137  Dressing Type Transparent;Securing device 10/11/23 1137  Dressing Status Antimicrobial disc in place;Clean, Dry, Intact 10/11/23 1137  Line Care Connections checked and tightened 10/11/23 1137  Line Adjustment (NICU/IV Team Only) No 10/11/23 1137  Dressing Intervention New dressing 10/11/23 1137  Dressing Change Due 10/18/23 10/11/23 1137       Burnard Bunting Chenice 10/11/2023, 11:38 AM

## 2023-10-11 NOTE — Progress Notes (Signed)
  Echocardiogram 2D Echocardiogram has been performed.  Kevin Underwood 10/11/2023, 8:39 AM

## 2023-10-11 NOTE — Progress Notes (Signed)
PICC ordered for OPAT.  Pt with hx of amphetamine use, negative since February.  Secure chat with Dr Gasper Lloyd recommending inpatient antibiotics via PIV due to concerns of discharging with PICC line in place with hx.  Dr Gasper Lloyd states inpatient tx is not an option. Dr Clarise Cruz also in conversation. States ok to proceed with PICC placement.

## 2023-10-11 NOTE — Progress Notes (Signed)
Advanced Heart Failure VAD Team Note  PCP-Cardiologist: Nona Dell, MD   Subjective:    Remains on IV abx. Now afebrile. Bcx 2/2 pseudomonas  Appetite improved.   MAPS high    LVAD INTERROGATION:  HeartMate 3 LVAD:   Flow 4.2 liters/min, speed 5600, power 4.0, PI 4.1.    Objective:    Vital Signs:   Temp:  [98 F (36.7 C)-98.7 F (37.1 C)] 98.4 F (36.9 C) (11/02 1102) Pulse Rate:  [70-90] 70 (11/02 1102) Resp:  [12-32] 32 (11/02 0624) BP: (104-132)/(77-105) 132/105 (11/02 1102) SpO2:  [98 %-100 %] 100 % (11/02 1102) Weight:  [84.1 kg] 84.1 kg (11/02 0624) Last BM Date : 10/10/23 Mean arterial Pressure 95-110  Intake/Output:   Intake/Output Summary (Last 24 hours) at 10/11/2023 1142 Last data filed at 10/11/2023 1107 Gross per 24 hour  Intake 120 ml  Output 1150 ml  Net -1030 ml     Physical Exam    General:  Sitting up in bed. No resp difficulty HEENT: normal Neck: supple. JVP 5-6 . Carotids 2+ bilat; no bruits. No lymphadenopathy or thyromegaly appreciated. Cor: Mechanical heart sounds with LVAD hum present. Lungs: clear Abdomen: soft, nontender, + large incision hernia Driveline: C/D/I; securement device intact and driveline incorporated Extremities: no cyanosis, clubbing, rash, edema Neuro: alert & orientedx3, cranial nerves grossly intact. moves all 4 extremities w/o difficulty. Affect pleasant   Telemetry   Sinus 70s Personally reviewed   Labs   Basic Metabolic Panel: Recent Labs  Lab 10/08/23 2348 10/09/23 1227 10/10/23 1058  NA 130* 133* 132*  K 4.1 4.1 4.0  CL 98 98 101  CO2 17* 21* 21*  GLUCOSE 132* 159* 128*  BUN 49* 44* 39*  CREATININE 2.22* 2.09* 1.79*  CALCIUM 7.8* 8.4* 7.9*  MG  --   --  2.0    Liver Function Tests: Recent Labs  Lab 10/08/23 2348  AST 33  ALT 31  ALKPHOS 135*  BILITOT 0.4  PROT 5.1*  ALBUMIN 1.9*   No results for input(s): "LIPASE", "AMYLASE" in the last 168 hours. No results for  input(s): "AMMONIA" in the last 168 hours.  CBC: Recent Labs  Lab 10/08/23 2348 10/09/23 1227 10/10/23 1058  WBC 15.4* 19.6* 11.7*  NEUTROABS 12.9*  --   --   HGB 8.1* 9.0* 8.4*  HCT 24.8* 27.9* 25.4*  MCV 78.0* 78.2* 79.1*  PLT 162 197 229    INR: Recent Labs  Lab 10/08/23 2348 10/09/23 1227 10/10/23 1058  INR 1.6* 1.7* 1.9*    Other results:   Imaging   ECHOCARDIOGRAM COMPLETE  Result Date: 10/11/2023    ECHOCARDIOGRAM REPORT   Patient Name:   Kevin Underwood. Date of Exam: 10/11/2023 Medical Rec #:  [id number]            Height:       70.0 in Accession #:    [id number]           Weight:       185.4 lb Date of Birth:  1960/12/21            BSA:          2.021 m Patient Age:    62 years             BP:           117/85 mmHg Patient Gender: M  HR:           71 bpm. Exam Location:  Inpatient Procedure: 2D Echo, Cardiac Doppler and Color Doppler Indications:    Endocarditis.  History:        Patient has prior history of Echocardiogram examinations, most                 recent 09/25/2023. Cardiomyopathy and CHF, Abnormal ECG, COPD,                 Arrythmias:Atrial Fibrillation; Risk Factors:Sleep Apnea,                 Dyslipidemia and Hypertension. Drive line infection. Cardiac                 shock. LVAD. ETOH.  Sonographer:    Sheralyn Boatman RDCS Referring Phys: [id number] [facility]  Sonographer Comments: Technically difficult study due to poor echo windows. LVAD device present. IMPRESSIONS  1. Septal hypokinesis with mild to moderate LV dysfunction; LVAD at apex.  2. Left ventricular ejection fraction, by estimation, is 40 to 45%. The left ventricle has mildly decreased function. The left ventricle demonstrates regional wall motion abnormalities (see scoring diagram/findings for description). There is severe left  ventricular hypertrophy. Left ventricular diastolic parameters are indeterminate.  3. Right ventricular systolic function is moderately reduced. The right  ventricular size is moderately enlarged. Tricuspid regurgitation signal is inadequate for assessing PA pressure.  4. Right atrial size was mildly dilated.  5. The mitral valve is normal in structure. No evidence of mitral valve regurgitation. No evidence of mitral stenosis.  6. The aortic valve has an indeterminant number of cusps. Aortic valve regurgitation is not visualized.  7. Aortic dilatation noted. There is mild dilatation of the aortic root, measuring 41 mm. There is mild dilatation of the ascending aorta, measuring 40 mm.  8. The inferior vena cava is normal in size with greater than 50% respiratory variability, suggesting right atrial pressure of 3 mmHg. FINDINGS  Left Ventricle: Left ventricular ejection fraction, by estimation, is 40 to 45%. The left ventricle has mildly decreased function. The left ventricle demonstrates regional wall motion abnormalities. The left ventricular internal cavity size was normal in size. There is severe left ventricular hypertrophy. Left ventricular diastolic parameters are indeterminate. Right Ventricle: The right ventricular size is moderately enlarged. Right ventricular systolic function is moderately reduced. Tricuspid regurgitation signal is inadequate for assessing PA pressure. The tricuspid regurgitant velocity is 2.55 m/s, and with an assumed right atrial pressure of 3 mmHg, the estimated right ventricular systolic pressure is 29.0 mmHg. Left Atrium: Left atrial size was normal in size. Right Atrium: Right atrial size was mildly dilated. Pericardium: There is no evidence of pericardial effusion. Mitral Valve: The mitral valve is normal in structure. No evidence of mitral valve regurgitation. No evidence of mitral valve stenosis. Tricuspid Valve: The tricuspid valve is normal in structure. Tricuspid valve regurgitation is mild . No evidence of tricuspid stenosis. Aortic Valve: The aortic valve has an indeterminant number of cusps. Aortic valve regurgitation is not  visualized. Pulmonic Valve: The pulmonic valve was normal in structure. Pulmonic valve regurgitation is not visualized. No evidence of pulmonic stenosis. Aorta: Aortic dilatation noted. There is mild dilatation of the aortic root, measuring 41 mm. There is mild dilatation of the ascending aorta, measuring 40 mm. Venous: The inferior vena cava is normal in size with greater than 50% respiratory variability, suggesting right atrial pressure of 3 mmHg. IAS/Shunts: No atrial level shunt  detected by color flow Doppler. Additional Comments: Septal hypokinesis with mild to moderate LV dysfunction; LVAD at apex.  LEFT VENTRICLE PLAX 2D LVIDd:         3.95 cm Diastology LVIDs:         3.00 cm LV e' medial:    4.90 cm/s LV PW:         1.85 cm LV E/e' medial:  13.4 LV IVS:        1.70 cm LV e' lateral:   6.09 cm/s                        LV E/e' lateral: 10.8  RIGHT VENTRICLE            IVC RV S prime:     5.55 cm/s  IVC diam: 1.40 cm TAPSE (M-mode): 0.5 cm LEFT ATRIUM             Index        RIGHT ATRIUM           Index LA diam:        4.30 cm 2.13 cm/m   RA Area:     11.70 cm LA Vol (A2C):   20.5 ml 10.14 ml/m  RA Volume:   20.50 ml  10.14 ml/m LA Vol (A4C):   26.6 ml 13.16 ml/m LA Biplane Vol: 23.7 ml 11.73 ml/m   AORTA Ao Root diam: 4.10 cm Ao Asc diam:  3.85 cm MITRAL VALVE               TRICUSPID VALVE MV Area (PHT): 4.49 cm    TR Peak grad:   26.0 mmHg MV Decel Time: 169 msec    TR Vmax:        255.00 cm/s MV E velocity: 65.50 cm/s MV A velocity: 68.70 cm/s MV E/A ratio:  0.95 Olga Millers MD Electronically signed by Olga Millers MD Signature Date/Time: 10/11/2023/11:34:15 AM    Final    [place] EKG SITE RITE  Result Date: 10/10/2023 If Site Rite image not attached, placement could not be confirmed due to current cardiac rhythm.    Medications:     Scheduled Medications:  Chlorhexidine Gluconate Cloth  6 each Topical Daily   HYDROmorphone HCl  8 mg Oral Q24H   insulin aspart  0-15 Units Subcutaneous  TID WC   insulin aspart  0-5 Units Subcutaneous QHS   metoprolol succinate  25 mg Oral Daily   minocycline  200 mg Oral BID   multivitamin with minerals  1 tablet Oral Daily   pantoprazole  40 mg Oral Daily   sertraline  50 mg Oral QHS   sodium chloride flush  10-40 mL Intracatheter Q12H   Warfarin - Pharmacist Dosing Inpatient   Does not apply q1600    Infusions:  ceFEPime (MAXIPIME) IV 2 g (10/11/23 0901)   DAPTOmycin 700 mg (10/10/23 1438)    PRN Medications: acetaminophen, HYDROmorphone, ipratropium-albuterol, ondansetron (ZOFRAN) IV, sodium chloride flush     Assessment/Plan:     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 On chronic minocycline and ciprofloxacin at home. Wound culture 10/1 grew MRSA. Readmitted 10/30 Bcx 2/2 Pseudomonas. CT ok WBC 15.4, procal 53 - WBC 19.6 -> 11.7 - Continue Cefepime and Dapto per ID. Also on minocycline for acinetobacter - PICC placed for prolonged IV abx  - Ideally would  go to SNF given h/o drug use but has refused   2. Abdominal pain with n/v:  CT 10/31 unremarkable Has struggled with ileus likely related to pain meds - On 8mg  daily exalgo, 2 mg PRN dilaudid Q6, +lidocaine patch - Continue home pain regimen. Adjust as needed. - Continue aggressive bowel regimen   3.  Chronic systolic CHF s/p Heartmate 3 LVAD on 11/17/20. Echo 10/11/23 EF 40-45% admit LVAD cannula in apex. - INR goal 2 - 2.5. INR 1.9 Discussed warfarin dosing with PharmD personally. - Volume status ok.  - MAPs up Holding Spiro, Losartan with AKI - Restart amlodipine. Use PRN IV hydralazine for now for HTN - VAD interrogated personally. Parameters stable.   4. 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  AKI in setting of infection/ATN - improving - Hold  GDMT  - SCr improving 0.8 -> 2.22 -> 1.79   5.  CAD S/p CABG 12/20 - Continue Crestor 20 mg daily.  - no s/s angina   6.  Paroxysmal atrial fibrillation status post maze and left atrial appendage clip. NSR this admission. - Off amiodarone with hyperthyroidism.   7. Type 2 diabetes -SSI   8. Hx methamphetamine/polysubstance abuse:  - concern for ongoing drug-seeking behavior   9. Hx Hyperthyroidism:  Likely related to amiodarone. He is now off amiodarone and methimazole.    10. Anemia (chronic blood loss):   - Hgb stable at 8.4 - Trend CBC    11. COPD: Prior smoker.  - On trelegy at home   12. Ventral hernia: Large ventral hernia, soft and nontender.  Does not appear incarcerated.  No bowel obstruction on 09/23/23 abdominal CT.  Not candidate for surgery due to proximity of hernia to driveline tunnel. Intolerant abdominal binder.     I reviewed the LVAD parameters from today, and compared the results to the patient's prior recorded data.  No programming changes were made.  The LVAD is functioning within specified parameters.  The patient performs LVAD self-test daily.  LVAD interrogation was negative for any significant power changes, alarms or PI events/speed drops.  LVAD equipment check completed and is in good working order.  Back-up equipment present.   LVAD education done on emergency procedures and precautions and reviewed exit site care.  Length of Stay: 2  Arvilla Meres, MD 10/11/2023, 11:42 AM  VAD Team --- VAD ISSUES ONLY--- Pager [phone] (7am - 7am)  Advanced Heart Failure Team  Pager [phone] (M-F; 7a - 5p)  Please contact [facility] for night-coverage after hours (5p -7a ) and weekends on [email or web address]

## 2023-10-11 NOTE — Progress Notes (Signed)
PHARMACY - ANTICOAGULATION CONSULT NOTE  Pharmacy Consult for warfarin Indication:  LVAD HM3  Allergies  Allergen Reactions   Firvanq [Vancomycin] Nausea And Vomiting and Other (See Comments)    Arthralgias     Patient Measurements: Height: 5\' 10"  (177.8 cm) Weight: 84.1 kg (185 lb 6.5 oz) IBW/kg (Calculated) : 73 Heparin Dosing Weight: 77.1 kg   Vital Signs: Temp: 98.4 F (36.9 C) (11/02 1102) Temp Source: Oral (11/02 1102) BP: 132/105 (11/02 1102) Pulse Rate: 70 (11/02 1102)  Labs: Recent Labs    10/08/23 2348 10/09/23 0137 10/09/23 1227 10/10/23 1058 10/11/23 1233  HGB 8.1*  --  9.0* 8.4* 9.0*  HCT 24.8*  --  27.9* 25.4* 28.2*  PLT 162  --  197 229 252  LABPROT 18.9*  --  20.5* 22.3* 23.1*  INR 1.6*  --  1.7* 1.9* 2.0*  CREATININE 2.22*  --  2.09* 1.79* 1.67*  TROPONINIHS 17 16  --   --   --     Estimated Creatinine Clearance: 47.4 mL/min (A) (by C-G formula based on SCr of 1.67 mg/dL (H)).   Medical History: Past Medical History:  Diagnosis Date   "    Arthritis    CAD (coronary artery disease)    a. s/p CABG in 11/2019 with LIMA-LAD, SVG-OM1, SVG-PDA and SVG-D1   CHF (congestive heart failure) (HCC)    a. EF < 20% by echo in 11/2019   Essential hypertension    PAF (paroxysmal atrial fibrillation) (HCC)    Type 2 diabetes mellitus (HCC)     Medications:  Scheduled:   amLODipine  10 mg Oral Daily   Chlorhexidine Gluconate Cloth  6 each Topical Daily   HYDROmorphone HCl  8 mg Oral Q24H   insulin aspart  0-15 Units Subcutaneous TID WC   insulin aspart  0-5 Units Subcutaneous QHS   metoprolol succinate  25 mg Oral Daily   minocycline  200 mg Oral BID   multivitamin with minerals  1 tablet Oral Daily   pantoprazole  40 mg Oral Daily   sertraline  50 mg Oral QHS   sodium chloride flush  10-40 mL Intracatheter Q12H   warfarin  5 mg Oral ONCE-1600   Warfarin - Pharmacist Dosing Inpatient   Does not apply q1600    Assessment: Kevin Underwood presenting  with SOB and cough - on warfarin PTA for hx LVAD HM3 (LD 10/30).  PTA regimen is 4 mg daily except 6 mg Wed.   INR this morning now within goal range at 2. Hgb 9, plt 252. LDH stable at 124.  Goal of Therapy:  INR 2-2.5 Monitor platelets by anticoagulation protocol: Yes   Plan:  Order warfarin 4 tonight (back to PTA regimen) Monitor daily INR, CBC, and for s/sx of bleeding   Thank you for allowing pharmacy to participate in this patient's care,  Sherron Monday, PharmD, BCCCP Clinical Pharmacist  Phone: [phone] 10/11/2023 2:57 PM  Please check AMION for all [facility] Pharmacy phone numbers After 10:00 PM, call Main Pharmacy [phone]

## 2023-10-11 NOTE — Progress Notes (Signed)
  Echocardiogram 2D Echocardiogram has been performed.  Kevin Underwood 10/11/2023, 8:41 AM

## 2023-10-11 NOTE — Consult Note (Signed)
[facility]    Date of Admission:  10/08/2023   Total days of inpatient antibiotics 2       Reason for Consult: PsA bacteremia    Principal Problem:   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 [facility]) Active Problems:   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 (LVAD) (HCC)   Assessment: 62 YM with Pmhx of DM, CHF SP LVAD on 11/17/20 c/b driveline infection with acinetobacter, MRSA and PsA on minocylcine and cipro for suppression found to have :  #Pseudomonas aeruginosa bacteremia -10/31 2/2 PsA -On cefepime and dapto -Reports 100% adherence to suppressive abx -CT showed LVAD with unchanged configuration since 09/08/23 -CTS engaged and recc IV abx  Recommendations:  -Continue cefepime and daptomycin. I am concerned about cipro R given pt's  adherence.  -Continue minocycline to cover acinetobacter -TTE -Follow Blood Cx Microbiology:   Antibiotics: Cefepime and daptomycin 10/30-  Cultures: Blood 10/31 2/2 PsA   HPI: Kevin Underwood. is a 62 y.o. male with PMHx of CAD SP CABG in 12/20 requiring impella due to cardiogenic shhock, afib , DM, SOPD, HTN SP LVAD c/b driveline infection with + MSSA and PsA treated with IV cefepime+ dapto and now on minocycline and cipro for suppression presented with SHOB and cough. Wbc 15k, pt afebrile. ID engaged as blood Cx+ PsA.    Review of Systems: Review of Systems  All other systems reviewed and are negative.   Past Medical History:  Diagnosis Date   "    Arthritis    CAD (coronary artery disease)    a. s/p CABG in 11/2019 with LIMA-LAD, SVG-OM1, SVG-PDA and SVG-D1   CHF (congestive heart failure) (HCC)    a. 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   Arthritis Other    Lung disease Other    Asthma Other    Diabetes Other    Thyroid disease Neg Hx    Scheduled Meds:  HYDROmorphone HCl  8 mg Oral Q24H   insulin aspart  0-15 Units Subcutaneous TID WC   insulin aspart  0-5 Units Subcutaneous QHS   metoprolol succinate  25 mg Oral Daily   minocycline  200 mg Oral BID   multivitamin with minerals  1 tablet Oral Daily   pantoprazole  40 mg Oral Daily   sertraline  50 mg Oral QHS   Warfarin - Pharmacist Dosing Inpatient   Does not apply q1600   Continuous Infusions:  ceFEPime (MAXIPIME) IV 2 g (10/10/23 2142)   DAPTOmycin 700 mg (10/10/23 1438)   PRN Meds:.acetaminophen,  HYDROmorphone, ipratropium-albuterol, ondansetron (ZOFRAN) IV Allergies  Allergen Reactions   Firvanq [Vancomycin] Nausea And Vomiting and Other (See Comments)    Arthralgias     OBJECTIVE: Blood pressure 104/77, pulse 82, temperature 98.3 F (36.8 C), temperature source Oral, resp. rate (!) 32, height 5\' 10"  (1.778 m), weight 84.1 kg, SpO2 98%.  Physical Exam Constitutional:      General: He is not in acute distress.    Appearance: He is normal weight. He is not toxic-appearing.  HENT:     Head: Normocephalic and atraumatic.     Right Ear: External ear normal.     Left Ear: External ear normal.     Nose: No congestion or rhinorrhea.     Mouth/Throat:     Mouth: Mucous membranes are moist.     Pharynx: Oropharynx is clear.  Eyes:     Extraocular Movements: Extraocular movements  intact.     Conjunctiva/sclera: Conjunctivae normal.     Pupils: Pupils are equal, round, and reactive to light.  Cardiovascular:     Rate and Rhythm: Normal rate and regular rhythm.     Heart sounds: No murmur heard.    No friction rub. No gallop.     Comments: VAD Pulmonary:     Effort: Pulmonary effort is normal.     Breath sounds: Normal breath sounds.  Abdominal:     General: Abdomen is flat. Bowel sounds are normal.     Palpations: Abdomen is soft.  Musculoskeletal:        General: No swelling. Normal range of motion.     Cervical back: Normal range of motion and neck supple.  Skin:    General: Skin is warm and dry.  Neurological:     General: No focal deficit present.     Mental Status: He is oriented to person, place, and time.  Psychiatric:        Mood and Affect: Mood normal.     Lab Results Lab Results  Component Value Date   WBC 11.7 (H) 10/10/2023   HGB 8.4 (L) 10/10/2023   HCT 25.4 (L) 10/10/2023   MCV 79.1 (L) 10/10/2023   PLT 229 10/10/2023    Lab Results  Component Value Date   CREATININE 1.79 (H) 10/10/2023   BUN 39 (H) 10/10/2023   NA 132 (L) 10/10/2023   K 4.0 10/10/2023   CL 101 10/10/2023   CO2 21 (L) 10/10/2023    Lab Results  Component Value Date   ALT 31 10/08/2023   AST 33 10/08/2023   ALKPHOS 135 (H) 10/08/2023   BILITOT 0.4 10/08/2023       Danelle Earthly, MD [facility] 10/11/2023, 6:52 AM I have personally spent 82 minutes involved in face-to-face and non-face-to-face activities for this patient on the day of the visit. Professional time spent includes the following activities: Preparing to see the patient (review of tests), Obtaining and/or reviewing separately obtained history (admission/discharge record), Performing a medically appropriate examination and/or evaluation , Ordering medications/tests/procedures, referring and communicating with other health care professionals,  Documenting clinical information in the EMR, Independently interpreting results (not separately reported), Communicating results to the patient/family/caregiver, Counseling and educating the patient/family/caregiver and Care coordination (not separately reported).

## 2023-10-12 ENCOUNTER — Inpatient Hospital Stay (HOSPITAL_COMMUNITY): Payer: 59

## 2023-10-12 DIAGNOSIS — T827XXA Infection and inflammatory reaction due to other cardiac and vascular devices, implants and grafts, initial encounter: Secondary | ICD-10-CM | POA: Diagnosis not present

## 2023-10-12 LAB — CBC
HCT: 29.2 % — ABNORMAL LOW (ref 39.0–52.0)
Hemoglobin: 9.4 g/dL — ABNORMAL LOW (ref 13.0–17.0)
MCH: 25.4 pg — ABNORMAL LOW (ref 26.0–34.0)
MCHC: 32.2 g/dL (ref 30.0–36.0)
MCV: 78.9 fL — ABNORMAL LOW (ref 80.0–100.0)
Platelets: 250 10*3/uL (ref 150–400)
RBC: 3.7 MIL/uL — ABNORMAL LOW (ref 4.22–5.81)
RDW: 15.3 % (ref 11.5–15.5)
WBC: 10.3 10*3/uL (ref 4.0–10.5)
nRBC: 0 % (ref 0.0–0.2)

## 2023-10-12 LAB — BASIC METABOLIC PANEL
Anion gap: 11 (ref 5–15)
BUN: 34 mg/dL — ABNORMAL HIGH (ref 8–23)
CO2: 25 mmol/L (ref 22–32)
Calcium: 8.5 mg/dL — ABNORMAL LOW (ref 8.9–10.3)
Chloride: 100 mmol/L (ref 98–111)
Creatinine, Ser: 1.61 mg/dL — ABNORMAL HIGH (ref 0.61–1.24)
GFR, Estimated: 48 mL/min — ABNORMAL LOW (ref >60)
Glucose, Bld: 166 mg/dL — ABNORMAL HIGH (ref 70–99)
Potassium: 4.2 mmol/L (ref 3.5–5.1)
Sodium: 136 mmol/L (ref 135–145)

## 2023-10-12 LAB — LACTATE DEHYDROGENASE: LDH: 126 U/L (ref 98–192)

## 2023-10-12 LAB — CULTURE, BLOOD (ROUTINE X 2)

## 2023-10-12 LAB — MAGNESIUM: Magnesium: 2.2 mg/dL (ref 1.7–2.4)

## 2023-10-12 LAB — GLUCOSE, CAPILLARY
Glucose-Capillary: 142 mg/dL — ABNORMAL HIGH (ref 70–99)
Glucose-Capillary: 199 mg/dL — ABNORMAL HIGH (ref 70–99)
Glucose-Capillary: 210 mg/dL — ABNORMAL HIGH (ref 70–99)
Glucose-Capillary: 134 mg/dL — ABNORMAL HIGH (ref 70–99)

## 2023-10-12 LAB — PROTIME-INR
INR: 2.2 — ABNORMAL HIGH (ref 0.8–1.2)
Prothrombin Time: 24.8 s — ABNORMAL HIGH (ref 11.4–15.2)

## 2023-10-12 MED ORDER — HYDRALAZINE HCL 25 MG PO TABS
25.0000 mg | ORAL_TABLET | Freq: Three times a day (TID) | ORAL | Status: DC
  Administered 2023-10-12 – 2023-10-13 (×3): 25 mg via ORAL
  Filled 2023-10-12 (×3): qty 1

## 2023-10-12 MED ORDER — WARFARIN SODIUM 3 MG PO TABS: 6.0000 mg | ORAL_TABLET | ORAL | Status: DC

## 2023-10-12 MED ORDER — SODIUM CHLORIDE 0.9 % IV SOLN
1.0000 g | Freq: Three times a day (TID) | INTRAVENOUS | Status: DC
  Administered 2023-10-12 – 2023-10-17 (×15): 1 g via INTRAVENOUS
  Filled 2023-10-12 (×17): qty 20

## 2023-10-12 MED ORDER — WARFARIN SODIUM 4 MG PO TABS
4.0000 mg | ORAL_TABLET | ORAL | Status: DC
  Administered 2023-10-12: 4 mg via ORAL
  Filled 2023-10-12: qty 1

## 2023-10-12 NOTE — Progress Notes (Signed)
Pharmacy Antibiotic Note  Bartow Zylstra. is a 62 y.o. male with LVAD and h/o chronic driveline infections admitted on 10/08/2023 with hypotension, possible infection.  Pharmacy has been consulted for daptomycin and Cefepime dosing.  Remains on minocycline from PTA for aceinetobacter  Plan: Daptomycin 700 mg IV q24h Cefepime 2 g IV q12h  Height: 5\' 10"  (177.8 cm) Weight: 83.7 kg (184 lb 8.4 oz) IBW/kg (Calculated) : 73  Temp (24hrs), Avg:97.8 F (36.6 C), Min:97.5 F (36.4 C), Max:98.2 F (36.8 C)  Recent Labs  Lab 10/08/23 2348 10/09/23 1227 10/10/23 1058 10/11/23 1233 10/12/23 0630  WBC 15.4* 19.6* 11.7* 10.1 10.3  CREATININE 2.22* 2.09* 1.79* 1.67* 1.61*    Estimated Creatinine Clearance: 49.1 mL/min (A) (by C-G formula based on SCr of 1.61 mg/dL (H)).    Allergies  Allergen Reactions   Firvanq [Vancomycin] Nausea And Vomiting and Other (See Comments)    Arthralgias    Cefepime 10/31 >> Daptomycin 10/31>>   Bcx 10/31: 2/4 GNR - BCID PSA   Jenetta Downer, BCCP Clinical Pharmacist  10/12/2023 1:42 PM   [facility] pharmacy phone numbers are listed on [email or web address]

## 2023-10-12 NOTE — Plan of Care (Signed)
  Problem: Education: Goal: Patient will understand all VAD equipment and how it functions Outcome: Progressing Goal: Patient will be able to verbalize current INR target range and antiplatelet therapy for discharge home Outcome: Progressing   Problem: Cardiac: Goal: LVAD will function as expected and patient will experience no clinical alarms Outcome: Progressing   Problem: Education: Goal: Knowledge of General Education information will improve Description: Including pain rating scale, medication(s)/side effects and non-pharmacologic comfort measures Outcome: Progressing   Problem: Health Behavior/Discharge Planning: Goal: Ability to manage health-related needs will improve Outcome: Progressing   Problem: Clinical Measurements: Goal: Ability to maintain clinical measurements within normal limits will improve Outcome: Progressing Goal: Will remain free from infection Outcome: Progressing Goal: Diagnostic test results will improve Outcome: Progressing Goal: Respiratory complications will improve Outcome: Progressing Goal: Cardiovascular complication will be avoided Outcome: Progressing   Problem: Activity: Goal: Risk for activity intolerance will decrease Outcome: Progressing   Problem: Nutrition: Goal: Adequate nutrition will be maintained Outcome: Progressing   Problem: Coping: Goal: Level of anxiety will decrease Outcome: Progressing   Problem: Elimination: Goal: Will not experience complications related to bowel motility Outcome: Progressing Goal: Will not experience complications related to urinary retention Outcome: Progressing   Problem: Pain Management: Goal: General experience of comfort will improve Outcome: Progressing   Problem: Safety: Goal: Ability to remain free from injury will improve Outcome: Progressing   Problem: Skin Integrity: Goal: Risk for impaired skin integrity will decrease Outcome: Progressing   Problem: Education: Goal:  Ability to describe self-care measures that may prevent or decrease complications (Diabetes Survival Skills Education) will improve Outcome: Progressing Goal: Individualized Educational Video(s) Outcome: Progressing   Problem: Coping: Goal: Ability to adjust to condition or change in health will improve Outcome: Progressing   Problem: Fluid Volume: Goal: Ability to maintain a balanced intake and output will improve Outcome: Progressing   Problem: Health Behavior/Discharge Planning: Goal: Ability to identify and utilize available resources and services will improve Outcome: Progressing Goal: Ability to manage health-related needs will improve Outcome: Progressing   Problem: Metabolic: Goal: Ability to maintain appropriate glucose levels will improve Outcome: Progressing   Problem: Nutritional: Goal: Maintenance of adequate nutrition will improve Outcome: Progressing Goal: Progress toward achieving an optimal weight will improve Outcome: Progressing   Problem: Skin Integrity: Goal: Risk for impaired skin integrity will decrease Outcome: Progressing   Problem: Tissue Perfusion: Goal: Adequacy of tissue perfusion will improve Outcome: Progressing

## 2023-10-12 NOTE — Progress Notes (Signed)
Pharmacy Antibiotic Note  Kevin Underwood. is a 62 y.o. male with LVAD and h/o chronic driveline infections admitted on 10/08/2023 with hypotension, possible infection.  Pharmacy has been consulted for daptomycin and Cefepime dosing.  Remains on minocycline from PTA for aceinetobacter.  ID following and blood cultures showing pseudomonas that is sensitive to gentamicin and imipenem. Order to change the Cefepime to Meropenem  Plan: Daptomycin 700 mg IV q24h Meropenem 1gm IV q8h Will f/u renal function, micro data, and pt's clinical condition  Height: 5\' 10"  (177.8 cm) Weight: 83.7 kg (184 lb 8.4 oz) IBW/kg (Calculated) : 73  Temp (24hrs), Avg:98.1 F (36.7 C), Min:97.7 F (36.5 C), Max:98.7 F (37.1 C)  Recent Labs  Lab 10/08/23 2348 10/09/23 1227 10/10/23 1058 10/11/23 1233 10/12/23 0630  WBC 15.4* 19.6* 11.7* 10.1 10.3  CREATININE 2.22* 2.09* 1.79* 1.67* 1.61*    Estimated Creatinine Clearance: 49.1 mL/min (A) (by C-G formula based on SCr of 1.61 mg/dL (H)).    Allergies  Allergen Reactions   Firvanq [Vancomycin] Nausea And Vomiting and Other (See Comments)    Arthralgias    Cefepime 10/31 >>11/3 Daptomycin 10/31 >>  Meropenem 11/3 >>  Bcx 10/31: 2/4 GNR - PSA (S gent/imipenem, R cipro/zosyn, Intermed ceftaz)   Christoper Fabian, PharmD, BCPS Please see [email or web address] for complete clinical pharmacist phone list  10/12/2023 9:21 PM

## 2023-10-12 NOTE — Progress Notes (Signed)
Advanced Heart Failure VAD Team Note  PCP-Cardiologist: Nona Dell, MD   Subjective:    Remains on IV abx. Now afebrile. Bcx 2/2 pseudomonas  Amlodipine restarted yesterday. MAPs still > 100  Denies SOB, orthopnea or PND. Tolerating abx.   Appetite ok   LVAD INTERROGATION:  HeartMate 3 LVAD:   Flow 4.0 liters/min, speed 5600, power 4.0, PI 4.0.    Objective:    Vital Signs:   Temp:  [97.5 F (36.4 C)-98.2 F (36.8 C)] 98.2 F (36.8 C) (11/03 1115) Pulse Rate:  [76-88] 88 (11/03 1115) Resp:  [17-25] 25 (11/03 1004) BP: (113-139)/(11-105) 128/99 (11/03 1115) SpO2:  [98 %-100 %] 98 % (11/02 1938) Weight:  [83.7 kg] 83.7 kg (11/03 0600) Last BM Date : 10/10/23 Mean arterial Pressure 100-110  Intake/Output:   Intake/Output Summary (Last 24 hours) at 10/12/2023 1430 Last data filed at 10/12/2023 0600 Gross per 24 hour  Intake --  Output 1350 ml  Net -1350 ml     Physical Exam    General:  NAD.  HEENT: normal  Neck: supple. JVP not elevated.  Carotids 2+ bilat; no bruits. No lymphadenopathy or thryomegaly appreciated. Cor: LVAD hum.  Lungs: Clear.Large incisional hernia. Driveline site clean. Anchor in place.  Extremities: no cyanosis, clubbing, rash. Warm no edema  Neuro: alert & oriented x 3. No focal deficits. Moves all 4 without problem    Telemetry   Sinus 80s Personally reviewed   Labs   Basic Metabolic Panel: Recent Labs  Lab 10/08/23 2348 10/09/23 1227 10/10/23 1058 10/11/23 1233 10/12/23 0630  NA 130* 133* 132* 135 136  K 4.1 4.1 4.0 4.1 4.2  CL 98 98 101 104 100  CO2 17* 21* 21* 22 25  GLUCOSE 132* 159* 128* 181* 166*  BUN 49* 44* 39* 34* 34*  CREATININE 2.22* 2.09* 1.79* 1.67* 1.61*  CALCIUM 7.8* 8.4* 7.9* 8.4* 8.5*  MG  --   --  2.0 1.8 2.2    Liver Function Tests: Recent Labs  Lab 10/08/23 2348  AST 33  ALT 31  ALKPHOS 135*  BILITOT 0.4  PROT 5.1*  ALBUMIN 1.9*   No results for input(s): "LIPASE", "AMYLASE" in the  last 168 hours. No results for input(s): "AMMONIA" in the last 168 hours.  CBC: Recent Labs  Lab 10/08/23 2348 10/09/23 1227 10/10/23 1058 10/11/23 1233 10/12/23 0630  WBC 15.4* 19.6* 11.7* 10.1 10.3  NEUTROABS 12.9*  --   --   --   --   HGB 8.1* 9.0* 8.4* 9.0* 9.4*  HCT 24.8* 27.9* 25.4* 28.2* 29.2*  MCV 78.0* 78.2* 79.1* 78.1* 78.9*  PLT 162 197 229 252 250    INR: Recent Labs  Lab 10/08/23 2348 10/09/23 1227 10/10/23 1058 10/11/23 1233 10/12/23 0630  INR 1.6* 1.7* 1.9* 2.0* 2.2*    Other results:   Imaging   CT LUMBAR SPINE WO CONTRAST  Result Date: 10/12/2023 CLINICAL DATA:  Initial evaluation for infection. EXAM: CT LUMBAR SPINE WITHOUT CONTRAST TECHNIQUE: Multidetector CT imaging of the lumbar spine was performed without intravenous contrast administration. Multiplanar CT image reconstructions were also generated. RADIATION DOSE REDUCTION: This exam was performed according to the departmental dose-optimization program which includes automated exposure control, adjustment of the mA and/or kV according to patient size and/or use of iterative reconstruction technique. COMPARISON:  Prior CT from 10/09/2023. FINDINGS: Segmentation: Standard. Lowest well-formed disc space labeled the L5-S1 level. Alignment: Physiologic with preservation of the normal lumbar lordosis. No listhesis. Vertebrae: Vertebral body  height maintained without acute or chronic fracture. Visualized sacrum and pelvis intact. No discrete or worrisome osseous lesions. Postoperative changes from prior PLIF at L4-5 and L5-S1. Hardware intact. No periprosthetic lucency to suggest loosening or infection. The left transpedicular screws at L4 and L5 are positioned lateral to the L4 and L5 vertebral bodies respectively. No other findings to suggest osteomyelitis discitis or septic arthritis. Paraspinal and other soft tissues: Chronic postoperative scarring within the posterior paraspinous soft tissues. No paraspinous  inflammatory changes or other abnormality. No visible collections. Nonobstructive left renal nephrolithiasis. Advanced aorto bi-iliac atherosclerotic disease. Disc levels: L1-2: Degenerative intervertebral disc space narrowing with diffuse disc bulge and disc desiccation. Reactive endplate spurring. Superimposed left foraminal to extraforaminal disc protrusion (series 5, image 25). Mild facet hypertrophy. No spinal stenosis. Mild left foraminal narrowing. Right neural foramen remains patent. L2-3: Diffuse disc bulge with reactive endplate spurring. Moderate facet hypertrophy. Resultant mild spinal stenosis. Mild left L2 foraminal narrowing. Right neural foramen remains patent. L3-4: Degenerative disc space narrowing with diffuse disc bulge. Reactive endplate spurring. Superimposed right subarticular to foraminal disc protrusion (series 5, image 55). Moderate bilateral facet hypertrophy. Mild left with severe right lateral recess stenosis. Mild to moderate right worse than left L3 foraminal narrowing. L4-5: Sequelae of prior PLIF. Evaluation of the spinal canal and neural foramina limited due to extensive streak artifact. L5-S1: Prior PLIF. Evaluation of the spinal canal and neural foramina limited by extensive streak artifact. No visible epidural collections. IMPRESSION: 1. No CT evidence for acute infection within the lumbar spine. 2. Postoperative changes from prior PLIF at L4-5 and L5-S1. No evidence for hardware complication. 3. Right subarticular to foraminal disc protrusion at L3-4. Either of the right L3 or descending L4 nerve roots could be affected. 4. Nonobstructive left renal nephrolithiasis. 5.  Aortic Atherosclerosis (ICD10-I70.0). Electronically Signed   By: Rise Mu M.D.   On: 10/12/2023 03:20   CT THORACIC SPINE WO CONTRAST  Result Date: 10/12/2023 CLINICAL DATA:  Initial evaluation for infection. EXAM: CT THORACIC SPINE WITHOUT CONTRAST TECHNIQUE: Multidetector CT images of the  thoracic were obtained using the standard protocol without intravenous contrast. RADIATION DOSE REDUCTION: This exam was performed according to the departmental dose-optimization program which includes automated exposure control, adjustment of the mA and/or kV according to patient size and/or use of iterative reconstruction technique. COMPARISON:  Prior CT from 10/09/2023. FINDINGS: Alignment: Physiologic with preservation of the normal thoracic kyphosis. No listhesis. Vertebrae: Vertebral body height maintained without acute or chronic fracture. No discrete or worrisome osseous lesions. No CT findings to suggest osteomyelitis discitis or septic arthritis. Paraspinal and other soft tissues: Paraspinous soft tissues demonstrate no acute finding. Small layering left pleural effusion noted. Emphysema noted. Aortic atherosclerosis. Nonobstructive left renal nephrolithiasis. Disc levels: Moderate degenerative intervertebral disc space narrowing with endplate spurring present throughout the mid and lower thoracic spine at T4-5 through T11-12. No significant spinal stenosis by CT. No visible epidural collections. IMPRESSION: 1. No CT evidence for acute infection within the thoracic spine. 2. Moderate degenerative spondylosis throughout the mid and lower thoracic spine without significant spinal stenosis. 3. Small layering left pleural effusion. 4. Nonobstructive left renal nephrolithiasis. Aortic Atherosclerosis (ICD10-I70.0) and Emphysema (ICD10-J43.9). Electronically Signed   By: Rise Mu M.D.   On: 10/12/2023 03:04   ECHOCARDIOGRAM COMPLETE  Result Date: 10/11/2023    ECHOCARDIOGRAM REPORT   Patient Name:   Kevin Underwood. Date of Exam: 10/11/2023 Medical Rec #:  [id number]  Height:       70.0 in Accession #:    [id number]           Weight:       185.4 lb Date of Birth:  11/01/1961            BSA:          2.021 m Patient Age:    62 years             BP:           117/85 mmHg Patient Gender:  M                    HR:           71 bpm. Exam Location:  Inpatient Procedure: 2D Echo, Cardiac Doppler and Color Doppler Indications:    Endocarditis.  History:        Patient has prior history of Echocardiogram examinations, most                 recent 09/25/2023. Cardiomyopathy and CHF, Abnormal ECG, COPD,                 Arrythmias:Atrial Fibrillation; Risk Factors:Sleep Apnea,                 Dyslipidemia and Hypertension. Drive line infection. Cardiac                 shock. LVAD. ETOH.  Sonographer:    Sheralyn Boatman RDCS Referring Phys: [id number] [facility]  Sonographer Comments: Technically difficult study due to poor echo windows. LVAD device present. IMPRESSIONS  1. Septal hypokinesis with mild to moderate LV dysfunction; LVAD at apex.  2. Left ventricular ejection fraction, by estimation, is 40 to 45%. The left ventricle has mildly decreased function. The left ventricle demonstrates regional wall motion abnormalities (see scoring diagram/findings for description). There is severe left  ventricular hypertrophy. Left ventricular diastolic parameters are indeterminate.  3. Right ventricular systolic function is moderately reduced. The right ventricular size is moderately enlarged. Tricuspid regurgitation signal is inadequate for assessing PA pressure.  4. Right atrial size was mildly dilated.  5. The mitral valve is normal in structure. No evidence of mitral valve regurgitation. No evidence of mitral stenosis.  6. The aortic valve has an indeterminant number of cusps. Aortic valve regurgitation is not visualized.  7. Aortic dilatation noted. There is mild dilatation of the aortic root, measuring 41 mm. There is mild dilatation of the ascending aorta, measuring 40 mm.  8. The inferior vena cava is normal in size with greater than 50% respiratory variability, suggesting right atrial pressure of 3 mmHg. FINDINGS  Left Ventricle: Left ventricular ejection fraction, by estimation, is 40 to 45%. The left ventricle  has mildly decreased function. The left ventricle demonstrates regional wall motion abnormalities. The left ventricular internal cavity size was normal in size. There is severe left ventricular hypertrophy. Left ventricular diastolic parameters are indeterminate. Right Ventricle: The right ventricular size is moderately enlarged. Right ventricular systolic function is moderately reduced. Tricuspid regurgitation signal is inadequate for assessing PA pressure. The tricuspid regurgitant velocity is 2.55 m/s, and with an assumed right atrial pressure of 3 mmHg, the estimated right ventricular systolic pressure is 29.0 mmHg. Left Atrium: Left atrial size was normal in size. Right Atrium: Right atrial size was mildly dilated. Pericardium: There is no evidence of pericardial effusion. Mitral Valve: The mitral valve is normal in structure. No evidence of mitral valve regurgitation.  No evidence of mitral valve stenosis. Tricuspid Valve: The tricuspid valve is normal in structure. Tricuspid valve regurgitation is mild . No evidence of tricuspid stenosis. Aortic Valve: The aortic valve has an indeterminant number of cusps. Aortic valve regurgitation is not visualized. Pulmonic Valve: The pulmonic valve was normal in structure. Pulmonic valve regurgitation is not visualized. No evidence of pulmonic stenosis. Aorta: Aortic dilatation noted. There is mild dilatation of the aortic root, measuring 41 mm. There is mild dilatation of the ascending aorta, measuring 40 mm. Venous: The inferior vena cava is normal in size with greater than 50% respiratory variability, suggesting right atrial pressure of 3 mmHg. IAS/Shunts: No atrial level shunt detected by color flow Doppler. Additional Comments: Septal hypokinesis with mild to moderate LV dysfunction; LVAD at apex.  LEFT VENTRICLE PLAX 2D LVIDd:         3.95 cm Diastology LVIDs:         3.00 cm LV e' medial:    4.90 cm/s LV PW:         1.85 cm LV E/e' medial:  13.4 LV IVS:        1.70  cm LV e' lateral:   6.09 cm/s                        LV E/e' lateral: 10.8  RIGHT VENTRICLE            IVC RV S prime:     5.55 cm/s  IVC diam: 1.40 cm TAPSE (M-mode): 0.5 cm LEFT ATRIUM             Index        RIGHT ATRIUM           Index LA diam:        4.30 cm 2.13 cm/m   RA Area:     11.70 cm LA Vol (A2C):   20.5 ml 10.14 ml/m  RA Volume:   20.50 ml  10.14 ml/m LA Vol (A4C):   26.6 ml 13.16 ml/m LA Biplane Vol: 23.7 ml 11.73 ml/m   AORTA Ao Root diam: 4.10 cm Ao Asc diam:  3.85 cm MITRAL VALVE               TRICUSPID VALVE MV Area (PHT): 4.49 cm    TR Peak grad:   26.0 mmHg MV Decel Time: 169 msec    TR Vmax:        255.00 cm/s MV E velocity: 65.50 cm/s MV A velocity: 68.70 cm/s MV E/A ratio:  0.95 Olga Millers MD Electronically signed by Olga Millers MD Signature Date/Time: 10/11/2023/11:34:15 AM    Final    [place] EKG SITE RITE  Result Date: 10/10/2023 If Site Rite image not attached, placement could not be confirmed due to current cardiac rhythm.    Medications:     Scheduled Medications:  amLODipine  10 mg Oral Daily   Chlorhexidine Gluconate Cloth  6 each Topical Daily   hydrALAZINE  25 mg Oral Q8H   HYDROmorphone HCl  8 mg Oral Q24H   insulin aspart  0-15 Units Subcutaneous TID WC   insulin aspart  0-5 Units Subcutaneous QHS   metoprolol succinate  25 mg Oral Daily   minocycline  200 mg Oral BID   multivitamin with minerals  1 tablet Oral Daily   pantoprazole  40 mg Oral Daily   sertraline  50 mg Oral QHS   sodium chloride flush  10-40 mL Intracatheter Q12H  warfarin  4 mg Oral Once per day on Sunday Monday Tuesday Thursday Friday Saturday   And   [START ON 10/15/2023] warfarin  6 mg Oral Every Wednesday   Warfarin - Pharmacist Dosing Inpatient   Does not apply q1600    Infusions:  ceFEPime (MAXIPIME) IV 2 g (10/12/23 0956)   DAPTOmycin 700 mg (10/12/23 1339)    PRN Medications: acetaminophen, hydrALAZINE, HYDROmorphone, ipratropium-albuterol, ondansetron (ZOFRAN)  IV, sodium chloride flush     Assessment/Plan:     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 On chronic minocycline and ciprofloxacin at home. Wound culture 10/1 grew MRSA. Readmitted 10/30 Bcx 2/2 Pseudomonas. CT ok procal 53 - Remains AF on IV abx - WBC 19.6 -> 11.7 -> 10.3 - Continue Cefepime and Dapto per ID. Also on minocycline for acinetobacter - PICC placed for prolonged IV abx  - Ideally would go to SNF given h/o drug use but has refused   2. Abdominal pain with n/v:  CT 10/31 unremarkable Has struggled with ileus likely related to pain meds - On 8mg  daily exalgo, 2 mg PRN dilaudid Q6, +lidocaine patch - Continue home pain regimen. Adjust as needed. - Continue aggressive bowel regimen  3.  Chronic systolic CHF s/p Heartmate 3 LVAD on 11/17/20. Echo 10/11/23 EF 40-45% admit LVAD cannula in apex. - INR goal 2 - 2.5. INR 2.2 Discussed warfarin dosing with PharmD personally. - Volume status looks good.  - MAPs up Holding Spiro, Losartan with AKI - Continue amlodipine.  - Start hydral 25 q8 - VAD interrogated personally. Parameters stable.  4. 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  AKI in setting of infection/ATN - improving - Hold GDMT  - SCr improving 0.8 -> 2.22 -> 1.79 -> 1.61   5.  CAD S/p CABG 12/20 - Continue Crestor 20 mg daily.  - no s/s angina   6.  Paroxysmal atrial fibrillation status post maze and left atrial appendage clip. NSR this admission. - Off amiodarone with hyperthyroidism.   7. Type 2 diabetes -SSI   8. Hx methamphetamine/polysubstance abuse:  - concern for ongoing drug-seeking behavior   9. Hx Hyperthyroidism:  Likely related to amiodarone. He is now off amiodarone and methimazole.    10. Anemia (chronic blood loss):   -  Hgb stable at 9.4 - Trend CBC    11. COPD: Prior smoker.  - On trelegy at home   12. Ventral hernia: Large ventral hernia, soft and nontender.  Does not appear incarcerated.  No bowel obstruction on 09/23/23 abdominal CT.  Not candidate for surgery due to proximity of hernia to driveline tunnel. Intolerant abdominal binder.     I reviewed the LVAD parameters from today, and compared the results to the patient's prior recorded data.  No programming changes were made.  The LVAD is functioning within specified parameters.  The patient performs LVAD self-test daily.  LVAD interrogation was negative for any significant power changes, alarms or PI events/speed drops.  LVAD equipment check completed and is in good working order.  Back-up equipment present.   LVAD education done on emergency procedures and precautions and reviewed exit site care.  Length of Stay: 3  Arvilla Meres, MD 10/12/2023, 2:30 PM  VAD Team --- VAD ISSUES ONLY--- Pager [phone] (7am - 7am)  Advanced Heart Failure Team  Pager [phone] (M-F; 7a - 5p)  Please contact [facility] for night-coverage after hours (5p -7a ) and weekends on [email or web address]

## 2023-10-12 NOTE — Progress Notes (Signed)
PHARMACY - ANTICOAGULATION CONSULT NOTE  Pharmacy Consult for warfarin Indication:  LVAD HM3  Allergies  Allergen Reactions   Firvanq [Vancomycin] Nausea And Vomiting and Other (See Comments)    Arthralgias     Patient Measurements: Height: 5\' 10"  (177.8 cm) Weight: 83.7 kg (184 lb 8.4 oz) IBW/kg (Calculated) : 73 Heparin Dosing Weight: 77.1 kg   Vital Signs: Temp: 98.2 F (36.8 C) (11/03 1115) Temp Source: Oral (11/03 1115) BP: 128/99 (11/03 1115) Pulse Rate: 88 (11/03 1115)  Labs: Recent Labs    10/10/23 1058 10/11/23 1233 10/12/23 0630  HGB 8.4* 9.0* 9.4*  HCT 25.4* 28.2* 29.2*  PLT 229 252 250  LABPROT 22.3* 23.1* 24.8*  INR 1.9* 2.0* 2.2*  CREATININE 1.79* 1.67* 1.61*    Estimated Creatinine Clearance: 49.1 mL/min (A) (by C-G formula based on SCr of 1.61 mg/dL (H)).   Medical History: Past Medical History:  Diagnosis Date   "    Arthritis    CAD (coronary artery disease)    a. s/p CABG in 11/2019 with LIMA-LAD, SVG-OM1, SVG-PDA and SVG-D1   CHF (congestive heart failure) (HCC)    a. EF < 20% by echo in 11/2019   Essential hypertension    PAF (paroxysmal atrial fibrillation) (HCC)    Type 2 diabetes mellitus (HCC)     Medications:  Scheduled:   amLODipine  10 mg Oral Daily   Chlorhexidine Gluconate Cloth  6 each Topical Daily   hydrALAZINE  25 mg Oral Q8H   HYDROmorphone HCl  8 mg Oral Q24H   insulin aspart  0-15 Units Subcutaneous TID WC   insulin aspart  0-5 Units Subcutaneous QHS   metoprolol succinate  25 mg Oral Daily   minocycline  200 mg Oral BID   multivitamin with minerals  1 tablet Oral Daily   pantoprazole  40 mg Oral Daily   sertraline  50 mg Oral QHS   sodium chloride flush  10-40 mL Intracatheter Q12H   warfarin  4 mg Oral Once per day on Sunday Monday Tuesday Thursday Friday Saturday   And   [START ON 10/15/2023] warfarin  6 mg Oral Every Wednesday   Warfarin - Pharmacist Dosing Inpatient   Does not apply q1600    Assessment: 62 yom presenting with SOB and cough - on warfarin PTA for hx LVAD HM3 (LD 10/30).  PTA regimen is 4 mg daily except 6 mg Wed.   INR this morning now within goal range at 2.2. Hgb 9.4, plt 250. LDH stable at 124.  Goal of Therapy:  INR 2-2.5 Monitor platelets by anticoagulation protocol: Yes   Plan:  Will restart PTA warfarin dosing of 4 mg daily except 6 mg on Wednesdays. Monitor daily INR, CBC, and for s/sx of bleeding   Reece Leader, Colon Flattery, [facility] Clinical Pharmacist  10/12/2023 1:39 PM   [facility] pharmacy phone numbers are listed on [email or web address]

## 2023-10-12 NOTE — Progress Notes (Addendum)
BRIEF ID NOTE   #Pseudomonas aeruginosa bacteremia 2/2 LVAD drive line infection #Back pain #hs of PsA and MRSA/acinetobacter drivline infection on suppressive cipro+ minocycline       Pseudomonas aeruginosa      MIC    CEFTAZIDIME 16 INTERMED... Intermediate    CIPROFLOXACIN >=4 RESISTANT Resistant    GENTAMICIN <=1 SENSITIVE Sensitive    IMIPENEM 2 SENSITIVE Sensitive    PIP/TAZO >=128 RESIS... Resistant    -D/C cefepime and daptomycin -Start merrem -Continue minocycline -TTE no veg, TEE if feasible -CT Land T spine no signs of infection -Pending TEE can give further abx guidance. Of note unfortunately pseudomonas is cipro R which leaves [place] without PO suppressive options -Goals of care discussion recommended

## 2023-10-13 ENCOUNTER — Encounter (HOSPITAL_COMMUNITY): Payer: 59 | Admitting: Cardiology

## 2023-10-13 DIAGNOSIS — T827XXA Infection and inflammatory reaction due to other cardiac and vascular devices, implants and grafts, initial encounter: Secondary | ICD-10-CM | POA: Diagnosis not present

## 2023-10-13 LAB — GLUCOSE, CAPILLARY
Glucose-Capillary: 150 mg/dL — ABNORMAL HIGH (ref 70–99)
Glucose-Capillary: 252 mg/dL — ABNORMAL HIGH (ref 70–99)
Glucose-Capillary: 244 mg/dL — ABNORMAL HIGH (ref 70–99)
Glucose-Capillary: 187 mg/dL — ABNORMAL HIGH (ref 70–99)

## 2023-10-13 LAB — BASIC METABOLIC PANEL
Anion gap: 8 (ref 5–15)
BUN: 25 mg/dL — ABNORMAL HIGH (ref 8–23)
CO2: 25 mmol/L (ref 22–32)
Calcium: 8.5 mg/dL — ABNORMAL LOW (ref 8.9–10.3)
Chloride: 103 mmol/L (ref 98–111)
Creatinine, Ser: 1.5 mg/dL — ABNORMAL HIGH (ref 0.61–1.24)
GFR, Estimated: 52 mL/min — ABNORMAL LOW (ref >60)
Glucose, Bld: 159 mg/dL — ABNORMAL HIGH (ref 70–99)
Potassium: 4.6 mmol/L (ref 3.5–5.1)
Sodium: 136 mmol/L (ref 135–145)

## 2023-10-13 LAB — CBC
HCT: 30.9 % — ABNORMAL LOW (ref 39.0–52.0)
Hemoglobin: 9.9 g/dL — ABNORMAL LOW (ref 13.0–17.0)
MCH: 24.9 pg — ABNORMAL LOW (ref 26.0–34.0)
MCHC: 32 g/dL (ref 30.0–36.0)
MCV: 77.8 fL — ABNORMAL LOW (ref 80.0–100.0)
Platelets: 267 10*3/uL (ref 150–400)
RBC: 3.97 MIL/uL — ABNORMAL LOW (ref 4.22–5.81)
RDW: 15.3 % (ref 11.5–15.5)
WBC: 14.5 10*3/uL — ABNORMAL HIGH (ref 4.0–10.5)
nRBC: 0 % (ref 0.0–0.2)

## 2023-10-13 LAB — LACTATE DEHYDROGENASE: LDH: 158 U/L (ref 98–192)

## 2023-10-13 LAB — PROTIME-INR
INR: 2.8 — ABNORMAL HIGH (ref 0.8–1.2)
Prothrombin Time: 29.3 s — ABNORMAL HIGH (ref 11.4–15.2)

## 2023-10-13 LAB — MAGNESIUM: Magnesium: 1.9 mg/dL (ref 1.7–2.4)

## 2023-10-13 MED ORDER — ROSUVASTATIN CALCIUM 20 MG PO TABS
20.0000 mg | ORAL_TABLET | Freq: Every day | ORAL | Status: DC
  Administered 2023-10-13 – 2023-10-17 (×5): 20 mg via ORAL
  Filled 2023-10-13 (×5): qty 1

## 2023-10-13 MED ORDER — POLYETHYLENE GLYCOL 3350 17 G PO PACK
17.0000 g | PACK | Freq: Every day | ORAL | Status: DC
  Administered 2023-10-13 – 2023-10-15 (×3): 17 g via ORAL
  Filled 2023-10-13 (×5): qty 1

## 2023-10-13 MED ORDER — WARFARIN SODIUM 2 MG PO TABS
2.0000 mg | ORAL_TABLET | Freq: Once | ORAL | Status: AC
  Administered 2023-10-13: 2 mg via ORAL
  Filled 2023-10-13: qty 1

## 2023-10-13 MED ORDER — HYDRALAZINE HCL 50 MG PO TABS
50.0000 mg | ORAL_TABLET | Freq: Three times a day (TID) | ORAL | Status: DC
  Administered 2023-10-13 – 2023-10-17 (×11): 50 mg via ORAL
  Filled 2023-10-13 (×12): qty 1

## 2023-10-13 NOTE — Progress Notes (Signed)
Patient refused blood cultures at this time.

## 2023-10-13 NOTE — Progress Notes (Signed)
CVAD removed per protocol per MD orde & ID recommendations. Manual pressure applied for 3  mins. Vaseline gauze, gauze, and tape applied over insertion site. No bleeding or swelling noted. Instructed patient to remain in bed for thirty mins. Educated patient about S/S of infection and when to call MD; no heavy lifting or pressure on R side for 24 hours; keep dressing dry and intact for 24 hours. Pt verbalized comprehension.

## 2023-10-13 NOTE — Progress Notes (Addendum)
Advanced Heart Failure VAD Team Note  PCP-Cardiologist: Nona Dell, MD   Subjective:    Psuedomonas is resistant to cipro so without suppressive options. IV abx adjusted per ID yesterday.  BP 130/57 this am. MAP 75.  sCr slowly improving 1.79>1.61>1.5  Feeling well this morning. Rested well overnight, no SOB or CP.   LVAD INTERROGATION:  HeartMate 3 LVAD:   Flow 4.4 liters/min, speed 5300 (set at 5600), power 4.0, PI 5.7. Continuous PI events  Objective:    Vital Signs:   Temp:  [97.7 F (36.5 C)-98.7 F (37.1 C)] 98 F (36.7 C) (11/04 0527) Pulse Rate:  [61-95] 75 (11/04 0527) Resp:  [12-25] 22 (11/04 0527) BP: (89-131)/(11-114) 130/57 (11/04 0527) SpO2:  [93 %-100 %] 93 % (11/04 0527) Weight:  [82.8 kg] 82.8 kg (11/04 0527) Last BM Date : 10/10/23 Mean arterial Pressure 100-110  Intake/Output:   Intake/Output Summary (Last 24 hours) at 10/13/2023 0710 Last data filed at 10/13/2023 0600 Gross per 24 hour  Intake 240 ml  Output 2850 ml  Net -2610 ml     Physical Exam    General:  NAD.  HEENT: normal  Neck: supple. JVP not elevated.  Carotids 2+ bilat; no bruits. No lymphadenopathy or thryomegaly appreciated. Cor: LVAD hum.  Abdomen: +hernia Lungs: Clear.Large incisional hernia. Driveline site clean. Anchor in place.  Extremities: no cyanosis, clubbing, rash. Warm no edema. RUE PICC Neuro: alert & oriented x 3. No focal deficits. Moves all 4 without problem   Telemetry   Sinus 80s. Frequent PVCs/  Labs   Basic Metabolic Panel: Recent Labs  Lab 10/09/23 1227 10/10/23 1058 10/11/23 1233 10/12/23 0630 10/13/23 0520  NA 133* 132* 135 136 136  K 4.1 4.0 4.1 4.2 4.6  CL 98 101 104 100 103  CO2 21* 21* 22 25 25   GLUCOSE 159* 128* 181* 166* 159*  BUN 44* 39* 34* 34* 25*  CREATININE 2.09* 1.79* 1.67* 1.61* 1.50*  CALCIUM 8.4* 7.9* 8.4* 8.5* 8.5*  MG  --  2.0 1.8 2.2 1.9    Liver Function Tests: Recent Labs  Lab 10/08/23 2348  AST 33  ALT  31  ALKPHOS 135*  BILITOT 0.4  PROT 5.1*  ALBUMIN 1.9*   No results for input(s): "LIPASE", "AMYLASE" in the last 168 hours. No results for input(s): "AMMONIA" in the last 168 hours.  CBC: Recent Labs  Lab 10/08/23 2348 10/09/23 1227 10/10/23 1058 10/11/23 1233 10/12/23 0630 10/13/23 0520  WBC 15.4* 19.6* 11.7* 10.1 10.3 14.5*  NEUTROABS 12.9*  --   --   --   --   --   HGB 8.1* 9.0* 8.4* 9.0* 9.4* 9.9*  HCT 24.8* 27.9* 25.4* 28.2* 29.2* 30.9*  MCV 78.0* 78.2* 79.1* 78.1* 78.9* 77.8*  PLT 162 197 229 252 250 267    INR: Recent Labs  Lab 10/09/23 1227 10/10/23 1058 10/11/23 1233 10/12/23 0630 10/13/23 0520  INR 1.7* 1.9* 2.0* 2.2* 2.8*    Other results:   Imaging   CT LUMBAR SPINE WO CONTRAST  Result Date: 10/12/2023 CLINICAL DATA:  Initial evaluation for infection. EXAM: CT LUMBAR SPINE WITHOUT CONTRAST TECHNIQUE: Multidetector CT imaging of the lumbar spine was performed without intravenous contrast administration. Multiplanar CT image reconstructions were also generated. RADIATION DOSE REDUCTION: This exam was performed according to the departmental dose-optimization program which includes automated exposure control, adjustment of the mA and/or kV according to patient size and/or use of iterative reconstruction technique. COMPARISON:  Prior CT from 10/09/2023. FINDINGS:  Segmentation: Standard. Lowest well-formed disc space labeled the L5-S1 level. Alignment: Physiologic with preservation of the normal lumbar lordosis. No listhesis. Vertebrae: Vertebral body height maintained without acute or chronic fracture. Visualized sacrum and pelvis intact. No discrete or worrisome osseous lesions. Postoperative changes from prior PLIF at L4-5 and L5-S1. Hardware intact. No periprosthetic lucency to suggest loosening or infection. The left transpedicular screws at L4 and L5 are positioned lateral to the L4 and L5 vertebral bodies respectively. No other findings to suggest  osteomyelitis discitis or septic arthritis. Paraspinal and other soft tissues: Chronic postoperative scarring within the posterior paraspinous soft tissues. No paraspinous inflammatory changes or other abnormality. No visible collections. Nonobstructive left renal nephrolithiasis. Advanced aorto bi-iliac atherosclerotic disease. Disc levels: L1-2: Degenerative intervertebral disc space narrowing with diffuse disc bulge and disc desiccation. Reactive endplate spurring. Superimposed left foraminal to extraforaminal disc protrusion (series 5, image 25). Mild facet hypertrophy. No spinal stenosis. Mild left foraminal narrowing. Right neural foramen remains patent. L2-3: Diffuse disc bulge with reactive endplate spurring. Moderate facet hypertrophy. Resultant mild spinal stenosis. Mild left L2 foraminal narrowing. Right neural foramen remains patent. L3-4: Degenerative disc space narrowing with diffuse disc bulge. Reactive endplate spurring. Superimposed right subarticular to foraminal disc protrusion (series 5, image 55). Moderate bilateral facet hypertrophy. Mild left with severe right lateral recess stenosis. Mild to moderate right worse than left L3 foraminal narrowing. L4-5: Sequelae of prior PLIF. Evaluation of the spinal canal and neural foramina limited due to extensive streak artifact. L5-S1: Prior PLIF. Evaluation of the spinal canal and neural foramina limited by extensive streak artifact. No visible epidural collections. IMPRESSION: 1. No CT evidence for acute infection within the lumbar spine. 2. Postoperative changes from prior PLIF at L4-5 and L5-S1. No evidence for hardware complication. 3. Right subarticular to foraminal disc protrusion at L3-4. Either of the right L3 or descending L4 nerve roots could be affected. 4. Nonobstructive left renal nephrolithiasis. 5.  Aortic Atherosclerosis (ICD10-I70.0). Electronically Signed   By: Rise Mu M.D.   On: 10/12/2023 03:20   CT THORACIC SPINE WO  CONTRAST  Result Date: 10/12/2023 CLINICAL DATA:  Initial evaluation for infection. EXAM: CT THORACIC SPINE WITHOUT CONTRAST TECHNIQUE: Multidetector CT images of the thoracic were obtained using the standard protocol without intravenous contrast. RADIATION DOSE REDUCTION: This exam was performed according to the departmental dose-optimization program which includes automated exposure control, adjustment of the mA and/or kV according to patient size and/or use of iterative reconstruction technique. COMPARISON:  Prior CT from 10/09/2023. FINDINGS: Alignment: Physiologic with preservation of the normal thoracic kyphosis. No listhesis. Vertebrae: Vertebral body height maintained without acute or chronic fracture. No discrete or worrisome osseous lesions. No CT findings to suggest osteomyelitis discitis or septic arthritis. Paraspinal and other soft tissues: Paraspinous soft tissues demonstrate no acute finding. Small layering left pleural effusion noted. Emphysema noted. Aortic atherosclerosis. Nonobstructive left renal nephrolithiasis. Disc levels: Moderate degenerative intervertebral disc space narrowing with endplate spurring present throughout the mid and lower thoracic spine at T4-5 through T11-12. No significant spinal stenosis by CT. No visible epidural collections. IMPRESSION: 1. No CT evidence for acute infection within the thoracic spine. 2. Moderate degenerative spondylosis throughout the mid and lower thoracic spine without significant spinal stenosis. 3. Small layering left pleural effusion. 4. Nonobstructive left renal nephrolithiasis. Aortic Atherosclerosis (ICD10-I70.0) and Emphysema (ICD10-J43.9). Electronically Signed   By: Rise Mu M.D.   On: 10/12/2023 03:04   ECHOCARDIOGRAM COMPLETE  Result Date: 10/11/2023    ECHOCARDIOGRAM REPORT  Patient Name:   Kevin Underwood. Date of Exam: 10/11/2023 Medical Rec #:  [id number]            Height:       70.0 in Accession #:    [id number]            Weight:       185.4 lb Date of Birth:  Jun 06, 1961            BSA:          2.021 m Patient Age:    62 years             BP:           117/85 mmHg Patient Gender: M                    HR:           71 bpm. Exam Location:  Inpatient Procedure: 2D Echo, Cardiac Doppler and Color Doppler Indications:    Endocarditis.  History:        Patient has prior history of Echocardiogram examinations, most                 recent 09/25/2023. Cardiomyopathy and CHF, Abnormal ECG, COPD,                 Arrythmias:Atrial Fibrillation; Risk Factors:Sleep Apnea,                 Dyslipidemia and Hypertension. Drive line infection. Cardiac                 shock. LVAD. ETOH.  Sonographer:    Sheralyn Boatman RDCS Referring Phys: [id number] [facility]  Sonographer Comments: Technically difficult study due to poor echo windows. LVAD device present. IMPRESSIONS  1. Septal hypokinesis with mild to moderate LV dysfunction; LVAD at apex.  2. Left ventricular ejection fraction, by estimation, is 40 to 45%. The left ventricle has mildly decreased function. The left ventricle demonstrates regional wall motion abnormalities (see scoring diagram/findings for description). There is severe left  ventricular hypertrophy. Left ventricular diastolic parameters are indeterminate.  3. Right ventricular systolic function is moderately reduced. The right ventricular size is moderately enlarged. Tricuspid regurgitation signal is inadequate for assessing PA pressure.  4. Right atrial size was mildly dilated.  5. The mitral valve is normal in structure. No evidence of mitral valve regurgitation. No evidence of mitral stenosis.  6. The aortic valve has an indeterminant number of cusps. Aortic valve regurgitation is not visualized.  7. Aortic dilatation noted. There is mild dilatation of the aortic root, measuring 41 mm. There is mild dilatation of the ascending aorta, measuring 40 mm.  8. The inferior vena cava is normal in size with greater than 50% respiratory  variability, suggesting right atrial pressure of 3 mmHg. FINDINGS  Left Ventricle: Left ventricular ejection fraction, by estimation, is 40 to 45%. The left ventricle has mildly decreased function. The left ventricle demonstrates regional wall motion abnormalities. The left ventricular internal cavity size was normal in size. There is severe left ventricular hypertrophy. Left ventricular diastolic parameters are indeterminate. Right Ventricle: The right ventricular size is moderately enlarged. Right ventricular systolic function is moderately reduced. Tricuspid regurgitation signal is inadequate for assessing PA pressure. The tricuspid regurgitant velocity is 2.55 m/s, and with an assumed right atrial pressure of 3 mmHg, the estimated right ventricular systolic pressure is 29.0 mmHg. Left Atrium: Left atrial size was normal in size. Right Atrium: Right  atrial size was mildly dilated. Pericardium: There is no evidence of pericardial effusion. Mitral Valve: The mitral valve is normal in structure. No evidence of mitral valve regurgitation. No evidence of mitral valve stenosis. Tricuspid Valve: The tricuspid valve is normal in structure. Tricuspid valve regurgitation is mild . No evidence of tricuspid stenosis. Aortic Valve: The aortic valve has an indeterminant number of cusps. Aortic valve regurgitation is not visualized. Pulmonic Valve: The pulmonic valve was normal in structure. Pulmonic valve regurgitation is not visualized. No evidence of pulmonic stenosis. Aorta: Aortic dilatation noted. There is mild dilatation of the aortic root, measuring 41 mm. There is mild dilatation of the ascending aorta, measuring 40 mm. Venous: The inferior vena cava is normal in size with greater than 50% respiratory variability, suggesting right atrial pressure of 3 mmHg. IAS/Shunts: No atrial level shunt detected by color flow Doppler. Additional Comments: Septal hypokinesis with mild to moderate LV dysfunction; LVAD at apex.  LEFT  VENTRICLE PLAX 2D LVIDd:         3.95 cm Diastology LVIDs:         3.00 cm LV e' medial:    4.90 cm/s LV PW:         1.85 cm LV E/e' medial:  13.4 LV IVS:        1.70 cm LV e' lateral:   6.09 cm/s                        LV E/e' lateral: 10.8  RIGHT VENTRICLE            IVC RV S prime:     5.55 cm/s  IVC diam: 1.40 cm TAPSE (M-mode): 0.5 cm LEFT ATRIUM             Index        RIGHT ATRIUM           Index LA diam:        4.30 cm 2.13 cm/m   RA Area:     11.70 cm LA Vol (A2C):   20.5 ml 10.14 ml/m  RA Volume:   20.50 ml  10.14 ml/m LA Vol (A4C):   26.6 ml 13.16 ml/m LA Biplane Vol: 23.7 ml 11.73 ml/m   AORTA Ao Root diam: 4.10 cm Ao Asc diam:  3.85 cm MITRAL VALVE               TRICUSPID VALVE MV Area (PHT): 4.49 cm    TR Peak grad:   26.0 mmHg MV Decel Time: 169 msec    TR Vmax:        255.00 cm/s MV E velocity: 65.50 cm/s MV A velocity: 68.70 cm/s MV E/A ratio:  0.95 Olga Millers MD Electronically signed by Olga Millers MD Signature Date/Time: 10/11/2023/11:34:15 AM    Final      Medications:     Scheduled Medications:  amLODipine  10 mg Oral Daily   Chlorhexidine Gluconate Cloth  6 each Topical Daily   hydrALAZINE  25 mg Oral Q8H   HYDROmorphone HCl  8 mg Oral Q24H   insulin aspart  0-15 Units Subcutaneous TID WC   insulin aspart  0-5 Units Subcutaneous QHS   metoprolol succinate  25 mg Oral Daily   minocycline  200 mg Oral BID   multivitamin with minerals  1 tablet Oral Daily   pantoprazole  40 mg Oral Daily   sertraline  50 mg Oral QHS   sodium chloride flush  10-40  mL Intracatheter Q12H   warfarin  4 mg Oral Once per day on Sunday Monday Tuesday Thursday Friday Saturday   And   [START ON 10/15/2023] warfarin  6 mg Oral Every Wednesday   Warfarin - Pharmacist Dosing Inpatient   Does not apply q1600    Infusions:  meropenem (MERREM) IV 1 g (10/13/23 0525)    PRN Medications: acetaminophen, hydrALAZINE, HYDROmorphone, ipratropium-albuterol, ondansetron (ZOFRAN) IV, sodium  chloride flush     Assessment/Plan:     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 On chronic minocycline and ciprofloxacin at home. Wound culture 10/1 grew MRSA. Readmitted 10/30 Bcx 2/2 Pseudomonas. CT ok procal 53 - Remains AF on IV abx - WBC 19.6 > 11.7 > 10.3 > 14.5 - Stopped Cefepime and Dapto and started Merem yesterday per ID. Also on minocycline for acinetobacter - Unfortunately pseudomonas is Cipro resistant which leaves [place] without PO suppressive options. Repeat blood cultures today. - PICC placed for prolonged IV abx  - Ideally would go to SNF given h/o drug use but has refused   2. Abdominal pain with n/v:  CT 10/31 unremarkable Has struggled with ileus likely related to pain meds - On 8mg  daily exalgo, 2 mg PRN dilaudid Q6, +lidocaine patch - Continue home pain regimen. Adjust as needed. - Continue aggressive bowel regimen  3.  Chronic systolic CHF s/p Heartmate 3 LVAD on 11/17/20. Echo 10/11/23 EF 40-45% admit LVAD cannula in apex. - INR goal 2 - 2.5. INR 2.8 Discussed warfarin dosing with PharmD personally. - Volume status looks good.  - MAPs up Holding Spiro, Losartan with AKI - Continue amlodipine.  - Continue hydral 25 q8, increase to 50 this afternoon if BP still elevated - VAD interrogated personally. Parameters stable.  4. 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  AKI in setting of infection/ATN - improving - Hold GDMT  - SCr improving 0.8 > 2.22 > 1.79 > 1.61>1.5   5.  CAD S/p CABG 12/20 - Continue Crestor 20 mg daily.  - no s/s angina   6.  Paroxysmal atrial fibrillation status post maze and left atrial appendage clip. NSR this admission. - Off amiodarone with hyperthyroidism.   7. Type 2 diabetes -SSI   8. Hx  methamphetamine/polysubstance abuse:  - concern for ongoing drug-seeking behavior   9. Hx Hyperthyroidism:  Likely related to amiodarone. He is now off amiodarone and methimazole.    10. Anemia (chronic blood loss):   - Hgb stable at 9.9 - Trend CBC    11. COPD: Prior smoker.  - On trelegy at home   12. Ventral hernia: Large ventral hernia, soft and nontender.  Does not appear incarcerated.  No bowel obstruction on 09/23/23 abdominal CT.  Not candidate for surgery due to proximity of hernia to driveline tunnel. Intolerant abdominal binder.    Length of Stay: 4  [place] Lee, NP 10/13/2023, 7:10 AM  VAD Team --- VAD ISSUES ONLY--- Pager [phone] (7am - 7am)  Advanced Heart Failure Team  Pager [phone] (M-F; 7a - 5p)  Please contact [facility] for night-coverage after hours (5p -7a ) and weekends on [email or web address]  Patient seen with NP, agree with the above note.   No complaints this morning.  MAP still running high, 90s-100s.  Afebrile, WBCs 14.5.  He remains on meropenem for resistant Pseudomonas.  Creatinine lower at 1.5.  General: Well appearing this am. NAD.  HEENT: Normal. Neck: Supple, JVP 7-8 cm. Carotids OK.  Cardiac:  Mechanical heart sounds with LVAD hum present.  Lungs:  CTAB, normal effort.  Abdomen:  NT, ND, no HSM. Large ventral hernia. +BS  LVAD exit site: Driveline exit site dressed.  Extremities:  Warm and dry. No cyanosis, clubbing, rash, or edema.  Neuro:  Alert & oriented x 3. Cranial nerves grossly intact. Moves all 4 extremities w/o difficulty. Affect pleasant    Patient has chronic driveline infection with Pseudomonas, now Cipro resistant.  No po antibiotic options.  He is on meropenem, looks like he will have to be discharged with PICC and meropenem for home.  Refuses SNF.  Repeat blood cultures today.  He will remain on minocycline for acinetobacter.   Increase hydralazine to 50 mg tid for elevated MAP.   INR 2.8, adjust warfarin for goal 2-2.5.     Hopefully we can come up with a plan to get him out of hospital this week.   Marca Ancona 10/13/2023 12:39 PM

## 2023-10-13 NOTE — TOC Progression Note (Addendum)
Transition of Care (TOC) - Progression Note    Patient Details  Name: Kevin Underwood. MRN: [id number] Date of Birth: [birth year]  Transition of [facility]) CM/SW Contact  [name], RN Phone Number: [phone] 10/13/2023, 2:47 PM  Clinical Narrative:  Contacted [employer] rep, Mitch for delivery of hospital bed to home. [name] rep, [name] following for IV abx, with possible dc home this week.     Expected Discharge Plan: Home w Home Health Services Barriers to Discharge: Continued Medical Work up  Expected Discharge Plan and Services   Discharge Planning Services: CM Consult Post Acute Care Choice: Home Health Living arrangements for the past 2 months: Single Family Home                           HH Arranged: RN, PT [facility] Agency: [facility] Date [facility] Agency Contacted: 10/10/23 Time HH Agency Contacted: [phone] Representative spoke with at [facility] Agency: Lorenza Chick   Social Determinants of Health (SDOH) Interventions SDOH Screenings   Food Insecurity: No Food Insecurity (10/09/2023)  Housing: Low Risk  (10/09/2023)  Transportation Needs: No Transportation Needs (10/09/2023)  Recent Concern: Transportation Needs - Unmet Transportation Needs (09/08/2023)  Utilities: Not At Risk (10/09/2023)  Alcohol Screen: Low Risk  (11/10/2020)  Depression (PHQ2-9): Low Risk  (10/16/2022)  Financial Resource Strain: Medium Risk (05/17/2022)  Physical Activity: Inactive (11/10/2020)  Tobacco Use: Medium Risk (10/08/2023)    Readmission Risk Interventions    07/01/2022    9:05 AM  Readmission Risk Prevention Plan  Transportation Screening Complete  Medication Review (RN Care Manager) Complete  PCP or Specialist appointment within 3-5 days of discharge Complete  HRI or Home Care Consult Complete  SW Recovery Care/Counseling Consult Complete  Palliative Care Screening Not Applicable  Skilled Nursing Facility Not Applicable

## 2023-10-13 NOTE — Plan of Care (Signed)
  Problem: Education: Goal: Patient will understand all VAD equipment and how it functions Outcome: Progressing Goal: Patient will be able to verbalize current INR target range and antiplatelet therapy for discharge home Outcome: Progressing   Problem: Cardiac: Goal: LVAD will function as expected and patient will experience no clinical alarms Outcome: Progressing   Problem: Education: Goal: Knowledge of General Education information will improve Description: Including pain rating scale, medication(s)/side effects and non-pharmacologic comfort measures Outcome: Progressing   Problem: Health Behavior/Discharge Planning: Goal: Ability to manage health-related needs will improve Outcome: Progressing   Problem: Clinical Measurements: Goal: Ability to maintain clinical measurements within normal limits will improve Outcome: Progressing Goal: Will remain free from infection Outcome: Progressing Goal: Diagnostic test results will improve Outcome: Progressing Goal: Respiratory complications will improve Outcome: Progressing Goal: Cardiovascular complication will be avoided Outcome: Progressing   Problem: Activity: Goal: Risk for activity intolerance will decrease Outcome: Progressing   Problem: Nutrition: Goal: Adequate nutrition will be maintained Outcome: Progressing   Problem: Coping: Goal: Level of anxiety will decrease Outcome: Progressing   Problem: Elimination: Goal: Will not experience complications related to bowel motility Outcome: Progressing Goal: Will not experience complications related to urinary retention Outcome: Progressing   Problem: Pain Management: Goal: General experience of comfort will improve Outcome: Progressing   Problem: Safety: Goal: Ability to remain free from injury will improve Outcome: Progressing   Problem: Skin Integrity: Goal: Risk for impaired skin integrity will decrease Outcome: Progressing   Problem: Education: Goal:  Ability to describe self-care measures that may prevent or decrease complications (Diabetes Survival Skills Education) will improve Outcome: Progressing Goal: Individualized Educational Video(s) Outcome: Progressing   Problem: Coping: Goal: Ability to adjust to condition or change in health will improve Outcome: Progressing   Problem: Fluid Volume: Goal: Ability to maintain a balanced intake and output will improve Outcome: Progressing   Problem: Health Behavior/Discharge Planning: Goal: Ability to identify and utilize available resources and services will improve Outcome: Progressing Goal: Ability to manage health-related needs will improve Outcome: Progressing   Problem: Metabolic: Goal: Ability to maintain appropriate glucose levels will improve Outcome: Progressing   Problem: Nutritional: Goal: Maintenance of adequate nutrition will improve Outcome: Progressing Goal: Progress toward achieving an optimal weight will improve Outcome: Progressing   Problem: Skin Integrity: Goal: Risk for impaired skin integrity will decrease Outcome: Progressing   Problem: Tissue Perfusion: Goal: Adequacy of tissue perfusion will improve Outcome: Progressing

## 2023-10-13 NOTE — Progress Notes (Signed)
  Subjective: Patient examined and medical records reviewed Patient had stable weekend without any recurrent symptoms related to his Pseudomonas bacteremia on presentation.  His antibiotic coverage has been changed to meropenem 1 g IV every 8 hours through PICC line.  His VAD tunnel wound infection is stable and continued wound care with Vashe wet-to-dry dressing changes daily is planned.  His INR is therapeutic, he is afebrile with normal WBC.  He wishes to go home with home medical therapies which should also include support/guidance for end-of-life issues.  Objective: Vital signs in last 24 hours: Temp:  [97.6 F (36.4 C)-98.7 F (37.1 C)] 97.6 F (36.4 C) (11/04 0726) Pulse Rate:  [61-95] 85 (11/04 0836) Cardiac Rhythm: Normal sinus rhythm;Heart block;Bundle branch block (11/04 0700) Resp:  [12-22] 20 (11/04 0726) BP: (89-134)/(57-114) 91/77 (11/04 1323) SpO2:  [93 %-100 %] 95 % (11/04 0726) Weight:  [82.8 kg] 82.8 kg (11/04 0527)  Hemodynamic parameters for last 24 hours:    Intake/Output from previous day: 11/03 0701 - 11/04 0700 In: 240 [P.O.:240] Out: 2850 [Urine:2850] Intake/Output this shift: Total I/O In: -  Out: 425 [Urine:425]  Exam Alert and appropriate Abdomen nontender Lower sternum nontender That tunnel surgical dressing dry and intact Normal sinus rhythm, normal VAD hum  Lab Results: Recent Labs    10/12/23 0630 10/13/23 0520  WBC 10.3 14.5*  HGB 9.4* 9.9*  HCT 29.2* 30.9*  PLT 250 267   BMET:  Recent Labs    10/12/23 0630 10/13/23 0520  NA 136 136  K 4.2 4.6  CL 100 103  CO2 25 25  GLUCOSE 166* 159*  BUN 34* 25*  CREATININE 1.61* 1.50*  CALCIUM 8.5* 8.5*    PT/INR:  Recent Labs    10/13/23 0520  LABPROT 29.3*  INR 2.8*   ABG    Component Value Date/Time   PHART 7.508 (H) 11/20/2020 0438   HCO3 24.8 09/09/2021 0313   TCO2 21 (L) 09/23/2023 1219   ACIDBASEDEF 6.0 (H) 11/24/2019 1718   O2SAT 63.4 09/09/2021 0313   CBG  (last 3)  Recent Labs    10/12/23 2120 10/13/23 0611 10/13/23 1311  GLUCAP 134* 150* 252*    Assessment/Plan: S/P  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  Patient does not meet criteria for further surgical debridement and IV antibiotic therapy, daily dressing changes, and adequate pain control at home are planned.  Palliative care counseling would be indicated.  LOS: 4 days    Kevin Underwood 10/13/2023

## 2023-10-13 NOTE — Plan of Care (Signed)
  Problem: Education: Goal: Patient will understand all VAD equipment and how it functions Outcome: Progressing Goal: Patient will be able to verbalize current INR target range and antiplatelet therapy for discharge home Outcome: Progressing   Problem: Education: Goal: Knowledge of General Education information will improve Description: Including pain rating scale, medication(s)/side effects and non-pharmacologic comfort measures Outcome: Progressing   Problem: Health Behavior/Discharge Planning: Goal: Ability to manage health-related needs will improve Outcome: Progressing   Problem: Clinical Measurements: Goal: Ability to maintain clinical measurements within normal limits will improve Outcome: Progressing Goal: Respiratory complications will improve Outcome: Progressing Goal: Cardiovascular complication will be avoided Outcome: Progressing   Problem: Activity: Goal: Risk for activity intolerance will decrease Outcome: Progressing   Problem: Nutrition: Goal: Adequate nutrition will be maintained Outcome: Progressing   Problem: Coping: Goal: Level of anxiety will decrease Outcome: Progressing   Problem: Pain Management: Goal: General experience of comfort will improve Outcome: Progressing   Problem: Safety: Goal: Ability to remain free from injury will improve Outcome: Progressing   Problem: Skin Integrity: Goal: Risk for impaired skin integrity will decrease Outcome: Progressing   Problem: Coping: Goal: Ability to adjust to condition or change in health will improve Outcome: Progressing   Problem: Fluid Volume: Goal: Ability to maintain a balanced intake and output will improve Outcome: Progressing   Problem: Health Behavior/Discharge Planning: Goal: Ability to manage health-related needs will improve Outcome: Progressing   Problem: Metabolic: Goal: Ability to maintain appropriate glucose levels will improve Outcome: Progressing   Problem:  Nutritional: Goal: Maintenance of adequate nutrition will improve Outcome: Progressing

## 2023-10-13 NOTE — Progress Notes (Signed)
PHARMACY - ANTICOAGULATION CONSULT NOTE  Pharmacy Consult for warfarin Indication:  LVAD HM3  Allergies  Allergen Reactions   Firvanq [Vancomycin] Nausea And Vomiting and Other (See Comments)    Arthralgias     Patient Measurements: Height: 5\' 10"  (177.8 cm) Weight: 82.8 kg (182 lb 8.7 oz) IBW/kg (Calculated) : 73 Heparin Dosing Weight: 77.1 kg   Vital Signs: Temp: 97.6 F (36.4 C) (11/04 0726) Temp Source: Oral (11/04 0726) BP: 134/107 (11/04 0726) Pulse Rate: 85 (11/04 0726)  Labs: Recent Labs    10/11/23 1233 10/12/23 0630 10/13/23 0520  HGB 9.0* 9.4* 9.9*  HCT 28.2* 29.2* 30.9*  PLT 252 250 267  LABPROT 23.1* 24.8* 29.3*  INR 2.0* 2.2* 2.8*  CREATININE 1.67* 1.61* 1.50*    Estimated Creatinine Clearance: 52.7 mL/min (A) (by C-G formula based on SCr of 1.5 mg/dL (H)).   Medical History: Past Medical History:  Diagnosis Date   "    Arthritis    CAD (coronary artery disease)    a. s/p CABG in 11/2019 with LIMA-LAD, SVG-OM1, SVG-PDA and SVG-D1   CHF (congestive heart failure) (HCC)    a. EF < 20% by echo in 11/2019   Essential hypertension    PAF (paroxysmal atrial fibrillation) (HCC)    Type 2 diabetes mellitus (HCC)     Medications:  Scheduled:   amLODipine  10 mg Oral Daily   Chlorhexidine Gluconate Cloth  6 each Topical Daily   hydrALAZINE  25 mg Oral Q8H   HYDROmorphone HCl  8 mg Oral Q24H   insulin aspart  0-15 Units Subcutaneous TID WC   insulin aspart  0-5 Units Subcutaneous QHS   metoprolol succinate  25 mg Oral Daily   minocycline  200 mg Oral BID   multivitamin with minerals  1 tablet Oral Daily   pantoprazole  40 mg Oral Daily   sertraline  50 mg Oral QHS   sodium chloride flush  10-40 mL Intracatheter Q12H   warfarin  4 mg Oral Once per day on Sunday Monday Tuesday Thursday Friday Saturday   And   [START ON 10/15/2023] warfarin  6 mg Oral Every Wednesday   Warfarin - Pharmacist Dosing Inpatient   Does not apply q1600    Assessment: 62 yom presenting with SOB and cough - on warfarin PTA for hx LVAD HM3 (LD 10/30).  PTA regimen is 4 mg daily except 6 mg Wed.   INR is supratherapeutic at 2.8. Hgb 9.9, plt 267. LDH 158. No s/sx of bleeding.   Goal of Therapy:  INR 2-2.5 Monitor platelets by anticoagulation protocol: Yes   Plan:  Reduce warfarin to 2 mg tonight  Monitor daily INR, CBC, and for s/sx of bleeding   Thank you for allowing pharmacy to participate in this patient's care,  Sherron Monday, PharmD, BCCCP Clinical Pharmacist  Phone: [phone] 10/13/2023 8:36 AM  Please check AMION for all [facility] Pharmacy phone numbers After 10:00 PM, call Main Pharmacy [phone]

## 2023-10-13 NOTE — Progress Notes (Signed)
LVAD Coordinator Rounding Note:  Admitted 10/09/23 to Heart Failure service from [facility] with worsening back pain and pt unable to eat or drink for 3-4 days.   CT abd/pelvis 10/09/23 1. No evidence of acute traumatic injury in the chest, abdomen, or pelvis. 2. LVAD is in unchanged in configuration compared with 09/08/2023. Similar thickening about the drive line. 3. Emphysema and chronic bronchitis. 4. Hepatic steatosis. 5. Nonobstructing left nephrolithiasis. 6. Large broad-based anterior abdominal wall hernia containing nonobstructed transverse colon and small bowel.   Pt laying in bed on my arrival. States he isn't feeling great this morning. He wants to go home, but understands that the team is working on arranging home health services and hospital bed to be delivered to his home.   CT Spine/Thoracic Spine 11/3 without infection.   BC + for pseudomonas 10/31. Pt would benefit from home IV antibiotics. He has been drug free and complaint in the past few months. Pt agrees to continue to be compliant with treatment to abstain from drug use. Per ID team note 11/3- Cefepime/Daptomycin discontinued due to antibiotic resistance per culture results. Started on Meropenem 1 g every 8 hours. Repeat blood cultures drawn this morning.   Palliative care consult has been placed by provider team. His weight is down almost 20 lbs from his discharge weight on 10/24.  Vital signs: Temp: 97.6 HR: 85 Doppler Pressure: not documented Automatic BP: 134/107 (115) O2 Sat: 95% on RA Wt: 178>185.5>182.5 lbs  LVAD interrogation reveals:  Speed: 5600 Flow: 4.5 Power: 4.1 w PI: 3.2  Alarms: none Events: 100+ PI events today Hct: 28  Fixed speed:  5600 Low speed limit:  5300  Drive Line: Existing VAD dressing removed and site care performed using sterile technique. Wound bed cleansed with VASHE solution. Skin surrounding wound bed cleaned with Chlora prep applicators x 2, allowed to dry. Followed by VASHE  soaked 2x2 flush to exit site and then covered with several dry 4 x 4s. Drive line partially incorporated. Driveline tunnels approx 5 cm. Small amount of thick yellow/serosanguinous drainage noted. No redness, tenderness, foul odor or rash noted. Ulcerated area distal to drive line cleansed with VASHE solution. VASHE moistened 2 x 2 placed over area and covered with gauze. Entire area covered with 2 large tegaderms. Drive line anchor reapplied. Continue daily wet to dry dressing changes using VASHE solution. Next dressing change due 10/14/23.     Pt had previous drive line debridement in OR with placement of instillation wound vac on 07/09/23 with Dr Donata Clay. This was changed to wet/dry on Friday 8/2. No further debridement of the VAD tunnel would be possible without injury to the outflow graft superiorly or the peritoneal cavity inferior to the power cord per Dr Donata Clay.   Labs:  LDH trend: 141>158  INR trend: 1.6>1.7>2.8  WBC trend: 15.4>19.6>14.5  Anticoagulation Plan: -INR Goal: 2.0 - 2.5 -ASA Dose: none  ICD: N/A  Infection:  10/09/23>> blood cxs>>  PSEUDOMONAS AERUGINOSA  10/13/23>> blood cxs>> pending  Drips:   Plan/Recommendations:  Page VAD coordinator with equipment issues or driveline problems Daily drive line wet to dry dressing changes with VASHE solution per bedside RN.   Alyce Pagan RN VAD Coordinator  Office: [phone]  24/7 Pager: [phone]

## 2023-10-13 NOTE — Progress Notes (Signed)
ID Brief note   Afebrile Cr down to 1.5, WBC mildly elevated at 14.5  2 sets of repeat blood cx ordered today for clearance of blood cx. Of note PICC placed on 11/2 before clearance of blood cultures and appropriate abtx, will need to be removed and replace with a new PICC once repeat blood cx negative in 72 hrs.  Continue meropenem and minocycline  Defer need of TEE to CHF team as he will need 6 weeks of IV abtx regardless.  Agree with GOC discussion as no suppressive PO options available   Odette Fraction, MD Infectious Disease Physician [facility] [address] [place], [place] [address] Phone: [phone]  Fax: [phone]

## 2023-10-14 DIAGNOSIS — Z515 Encounter for palliative care: Secondary | ICD-10-CM | POA: Diagnosis not present

## 2023-10-14 DIAGNOSIS — Z95811 Presence of heart assist device: Secondary | ICD-10-CM | POA: Diagnosis not present

## 2023-10-14 DIAGNOSIS — T827XXD Infection and inflammatory reaction due to other cardiac and vascular devices, implants and grafts, subsequent encounter: Secondary | ICD-10-CM

## 2023-10-14 DIAGNOSIS — T827XXA Infection and inflammatory reaction due to other cardiac and vascular devices, implants and grafts, initial encounter: Secondary | ICD-10-CM | POA: Diagnosis not present

## 2023-10-14 DIAGNOSIS — N179 Acute kidney failure, unspecified: Secondary | ICD-10-CM | POA: Diagnosis not present

## 2023-10-14 LAB — GLUCOSE, CAPILLARY
Glucose-Capillary: 150 mg/dL — ABNORMAL HIGH (ref 70–99)
Glucose-Capillary: 175 mg/dL — ABNORMAL HIGH (ref 70–99)
Glucose-Capillary: 206 mg/dL — ABNORMAL HIGH (ref 70–99)
Glucose-Capillary: 135 mg/dL — ABNORMAL HIGH (ref 70–99)

## 2023-10-14 LAB — PROTIME-INR
INR: 2.5 — ABNORMAL HIGH (ref 0.8–1.2)
Prothrombin Time: 27.1 s — ABNORMAL HIGH (ref 11.4–15.2)

## 2023-10-14 LAB — CBC
HCT: 31.6 % — ABNORMAL LOW (ref 39.0–52.0)
Hemoglobin: 10 g/dL — ABNORMAL LOW (ref 13.0–17.0)
MCH: 24.9 pg — ABNORMAL LOW (ref 26.0–34.0)
MCHC: 31.6 g/dL (ref 30.0–36.0)
MCV: 78.8 fL — ABNORMAL LOW (ref 80.0–100.0)
Platelets: 258 10*3/uL (ref 150–400)
RBC: 4.01 MIL/uL — ABNORMAL LOW (ref 4.22–5.81)
RDW: 15.3 % (ref 11.5–15.5)
WBC: 12.5 10*3/uL — ABNORMAL HIGH (ref 4.0–10.5)
nRBC: 0 % (ref 0.0–0.2)

## 2023-10-14 LAB — BASIC METABOLIC PANEL
Anion gap: 12 (ref 5–15)
BUN: 27 mg/dL — ABNORMAL HIGH (ref 8–23)
CO2: 22 mmol/L (ref 22–32)
Calcium: 8.3 mg/dL — ABNORMAL LOW (ref 8.9–10.3)
Chloride: 99 mmol/L (ref 98–111)
Creatinine, Ser: 1.35 mg/dL — ABNORMAL HIGH (ref 0.61–1.24)
GFR, Estimated: 59 mL/min — ABNORMAL LOW (ref >60)
Glucose, Bld: 262 mg/dL — ABNORMAL HIGH (ref 70–99)
Potassium: 4.5 mmol/L (ref 3.5–5.1)
Sodium: 133 mmol/L — ABNORMAL LOW (ref 135–145)

## 2023-10-14 LAB — LACTATE DEHYDROGENASE: LDH: 158 U/L (ref 98–192)

## 2023-10-14 LAB — MAGNESIUM: Magnesium: 1.6 mg/dL — ABNORMAL LOW (ref 1.7–2.4)

## 2023-10-14 MED ORDER — MEROPENEM IV (FOR PTA / DISCHARGE USE ONLY): 1.0000 g | Freq: Three times a day (TID) | INTRAVENOUS | 0 refills | Status: DC

## 2023-10-14 MED ORDER — LOSARTAN POTASSIUM 25 MG PO TABS
12.5000 mg | ORAL_TABLET | Freq: Every day | ORAL | Status: DC
  Administered 2023-10-14 – 2023-10-17 (×4): 12.5 mg via ORAL
  Filled 2023-10-14 (×4): qty 1

## 2023-10-14 MED ORDER — FLUTICASONE PROPIONATE 50 MCG/ACT NA SUSP
1.0000 | Freq: Every day | NASAL | Status: DC | PRN
  Filled 2023-10-14: qty 16

## 2023-10-14 MED ORDER — INSULIN GLARGINE-YFGN 100 UNIT/ML ~~LOC~~ SOLN
10.0000 [IU] | Freq: Every day | SUBCUTANEOUS | Status: DC
  Administered 2023-10-14 – 2023-10-17 (×4): 10 [IU] via SUBCUTANEOUS
  Filled 2023-10-14 (×4): qty 0.1

## 2023-10-14 MED ORDER — WARFARIN SODIUM 4 MG PO TABS
4.0000 mg | ORAL_TABLET | Freq: Once | ORAL | Status: AC
  Administered 2023-10-14: 4 mg via ORAL
  Filled 2023-10-14: qty 1

## 2023-10-14 MED ORDER — MAGNESIUM SULFATE 4 GM/100ML IV SOLN
4.0000 g | Freq: Once | INTRAVENOUS | Status: AC
  Administered 2023-10-14: 4 g via INTRAVENOUS
  Filled 2023-10-14: qty 100

## 2023-10-14 NOTE — Inpatient Diabetes Management (Signed)
Inpatient Diabetes Program Recommendations  AACE/ADA: New Consensus Statement on Inpatient Glycemic Control (2015)  Target Ranges:  Prepandial:   less than 140 mg/dL      Peak postprandial:   less than 180 mg/dL (1-2 hours)      Critically ill patients:  140 - 180 mg/dL   Lab Results  Component Value Date   GLUCAP 150 (H) 10/14/2023   HGBA1C 6.3 (H) 07/09/2023    Review of Glycemic Control  Latest Reference Range & Units 10/13/23 06:11 10/13/23 13:11 10/13/23 16:19 10/13/23 21:17 10/14/23 05:58  Glucose-Capillary 70 - 99 mg/dL 454 (H) 098 (H) 119 (H) 187 (H) 150 (H)   Diabetes history: DM  Outpatient Diabetes medications:  Glucotrol 5 mg daily Metformin 1000 mg bid Current orders for Inpatient glycemic control:  Novolog moderate tid with meals and HS  Inpatient Diabetes Program Recommendations:    May consider adding Semglee 10 units daily while in the hospital.   Thanks,  Beryl Meager, RN, BC-ADM Inpatient Diabetes Coordinator Pager [phone]  (8a-5p)

## 2023-10-14 NOTE — Progress Notes (Signed)
Patient ID: Kevin Rigg., male   DOB: [birth year], 62 y.o.   MRN: [id number]    Progress Note from the Palliative Medicine Team at [facility]   Patient Name: Kevin Underwood.        Date: 10/14/2023 DOB: Kevin Underwood  Age: 62 y.o. MRN#: [id number] Attending Physician: Kevin Underwood Primary Care Physician: Kevin Underwood Admit Date: 10/08/2023   Reason for Consultation/Follow-up   Establishing Goals of Care   HPI/ Brief Hospital Review  62 year old male with history of DM, CAD status post CABG, atrial clipping, CHF status post LVAD complicated with driveline infection with MRSA.   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 Patient does not meet criteria for further surgical debridement   Limited antibiotic options, no p.o. antibiotic options.    Patient faces decisions regarding serious life limiting illness; treatment option decisions,  advanced directive decisions and anticipatory care needs.   Subjective  Extensive chart review has been completed prior to meeting with patient  including labs, vital signs, imaging, progress/consult notes, orders, medications and available advance directive documents.    This NP assessed patient at the bedside as a follow up for palliative medicine needs and emotional support.    Patient is sitting on the side of the bed.  I sat with Kevin Underwood and attempted to create a space for him to share his thoughts and feelings regarding his current medical situation.  He quickly and adamantly shuts down conversation regarding any conversation that relates to discussion around his own mortality and decisions regarding end-of-life.  He tells me he is "tired and done talking about this".  He tells me he discussed with Dr. Maren Underwood 6 weeks of home antibiotics and depending "if that works" he will make decisions that.  He feels that "people just want me to die and kill myself".  I attempted to offer  information regarding concepts regarding allowing for natural death, Kevin Underwood does not want to talk at this time and requests no further conversation.  Education offered today regarding  the importance of continued conversation with family and their  medical providers regarding overall plan of care and treatment options,  ensuring decisions are within the context of the patients values and GOCs.  I encouraged Kevin Underwood to call with any questions or concerns and left contact information for PMT  Discussed with Kevin Lee NP / Advanced Heart Failure VAD   PMT will sign off at this time, please reconsult in the future if we can be of any assistance.   PMT will sign off at this time, patient does not wish to discuss   Time:   50    minutes  Detailed review of medical records ( labs, imaging, vital signs), medically appropriate exam ( MS, skin, cardiac,  resp)   discussed with treatment team, counseling and education to patient, family, staff, documenting clinical information, medication management, coordination of care    Lorinda Creed NP  Palliative Medicine Team Team Phone # [phone] Pager [phone]

## 2023-10-14 NOTE — Progress Notes (Signed)
PHARMACY - ANTICOAGULATION CONSULT NOTE  Pharmacy Consult for warfarin Indication:  LVAD HM3  Allergies  Allergen Reactions   Firvanq [Vancomycin] Nausea And Vomiting and Other (See Comments)    Arthralgias     Patient Measurements: Height: 5\' 10"  (177.8 cm) Weight: 83.7 kg (184 lb 8.4 oz) IBW/kg (Calculated) : 73 Heparin Dosing Weight: 77.1 kg   Vital Signs: Temp: 97.7 F (36.5 C) (11/05 1100) Temp Source: Oral (11/05 1100) BP: 113/81 (11/05 1100) Pulse Rate: 96 (11/05 0904)  Labs: Recent Labs    10/12/23 0630 10/13/23 0520 10/14/23 0801  HGB 9.4* 9.9* 10.0*  HCT 29.2* 30.9* 31.6*  PLT 250 267 258  LABPROT 24.8* 29.3* 27.1*  INR 2.2* 2.8* 2.5*  CREATININE 1.61* 1.50* 1.35*    Estimated Creatinine Clearance: 58.6 mL/min (A) (by C-G formula based on SCr of 1.35 mg/dL (H)).   Medical History: Past Medical History:  Diagnosis Date   "    Arthritis    CAD (coronary artery disease)    a. s/p CABG in 11/2019 with LIMA-LAD, SVG-OM1, SVG-PDA and SVG-D1   CHF (congestive heart failure) (HCC)    a. EF < 20% by echo in 11/2019   Essential hypertension    PAF (paroxysmal atrial fibrillation) (HCC)    Type 2 diabetes mellitus (HCC)     Medications:  Scheduled:   amLODipine  10 mg Oral Daily   Chlorhexidine Gluconate Cloth  6 each Topical Daily   hydrALAZINE  50 mg Oral Q8H   HYDROmorphone HCl  8 mg Oral Q24H   insulin aspart  0-15 Units Subcutaneous TID WC   insulin aspart  0-5 Units Subcutaneous QHS   losartan  12.5 mg Oral Daily   metoprolol succinate  25 mg Oral Daily   minocycline  200 mg Oral BID   multivitamin with minerals  1 tablet Oral Daily   pantoprazole  40 mg Oral Daily   polyethylene glycol  17 g Oral Daily   rosuvastatin  20 mg Oral Daily   sertraline  50 mg Oral QHS   sodium chloride flush  10-40 mL Intracatheter Q12H   Warfarin - Pharmacist Dosing Inpatient   Does not apply q1600   Assessment: 62 yom presenting with SOB and cough - on  warfarin PTA for hx LVAD HM3 (LD 10/30).  PTA regimen is 4 mg daily except 6 mg Wed.   INR is now therapeutic at 2.5 after lower dose warfarin last night. Hgb 10, plt 258. LDH 158. No s/sx of bleeding.   Goal of Therapy:  INR 2-2.5 Monitor platelets by anticoagulation protocol: Yes   Plan:  Order warfarin at 4 mg tonight  Monitor daily INR, CBC, and for s/sx of bleeding   Thank you for allowing pharmacy to participate in this patient's care,  Sherron Monday, PharmD, BCCCP Clinical Pharmacist  Phone: [phone] 10/14/2023 11:55 AM  Please check AMION for all [facility] Pharmacy phone numbers After 10:00 PM, call Main Pharmacy [phone]

## 2023-10-14 NOTE — Progress Notes (Signed)
PHARMACY CONSULT NOTE FOR:  OUTPATIENT  PARENTERAL ANTIBIOTIC THERAPY (OPAT)  Indication: Pseudomonas bacteremia in setting of LVAD  Regimen: Meropenem 1 g Q8H  End date: 11/24/23  IV antibiotic discharge orders are pended. To discharging provider:  please sign these orders via discharge navigator,  Select New Orders & click on the button choice - Manage This Unsigned Work.     Thank you for allowing pharmacy to be a part of this patient's care.  Lennie Muckle, PharmD PGY1 Pharmacy Resident 10/14/2023 1:53 PM

## 2023-10-14 NOTE — Progress Notes (Addendum)
[facility] Infectious Diseases Follow Up Note  Patient Identification: Patient Name: Kevin Underwood. 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 PICC removed 11/4   Assessment 62 year old male with history of DM, CAD status post CABG, atrial clipping, CHF status post LVAD complicated with driveline infection with MRSA, Acinetobacter and Pseudomonas aeruginosa on suppressive ciprofloxacin and minocycline admitted with  # PsA bacteremia, Cipro R  - CT 10/31 -LVAD is unchanged in configuration compared with 09/08/2023.  Similar to taking about the driveline - CT VS recommended conservative management with IV antibiotics - 11/2 TTE no obvious vegetation - 11/4 PICC line removed  - 11/4 repeat blood cx NGTD  Recommendations Continue meropenem Continue minocycline for suppression ( MRSA and acinetobacter)  Place no PICC once repeat blood culture in 9/4 is negative in 72 hours Plan for 6 weeks of meropenem from date of negative blood cultures on 11/5 during the stay negative Monitor CBC and CMP on antibiotics. Recommend goals of care discussion given no p.o. suppressive options Call us back with any questions or concerns, ID will so for now   OPAT  Diagnosis: PsA bacteremia in the setting of driveline +  Culture Result: PsA  Allergies  Allergen Reactions   Firvanq [Vancomycin] Nausea And Vomiting and Other (See Comments)    Arthralgias     OPAT Orders Discharge antibiotics to be given via PICC line Discharge antibiotics:  meropenem 1g iv q8 hrs Per pharmacy protocol  Duration: 6 weeks  End Date: 11/24/23  [facility] Care Per Protocol:  Home health RN for IV administration and teaching; PICC line care and labs.    Labs weekly while on IV antibiotics: X__ CBC with differential __ BMP X__ CMP X__ CRP X__ ESR __ Vancomycin trough __ CK  _ Please pull PIC at completion of IV antibiotics X__ Please leave PIC in place until doctor has seen patient or been notified  Fax weekly labs to [phone]  Clinic Follow Up Appt: 12: 10 at 10: 45 with Dr Cherrie Gauze of the management as per the primary team. Thank you for the consult. Please page with pertinent questions or concerns.  ______________________________________________________________________ Subjective patient seen and examined at the bedside.  Wife at bedside.  He has no complaints.  Discussed management plan in terms of IV antibiotics  Past Medical History:  Diagnosis Date   "    Arthritis    CAD (coronary artery disease)    a. s/p CABG in 11/2019 with LIMA-LAD, SVG-OM1, SVG-PDA and SVG-D1   CHF (congestive heart failure) (HCC)    a. EF < 20% by echo in 11/2019   Essential hypertension    PAF (paroxysmal atrial fibrillation) (HCC)    Type 2 diabetes mellitus (HCC)    Past Surgical History:  Procedure Laterality Date   APPLICATION OF WOUND VAC Left 06/14/2022   Procedure: APPLICATION OF WOUND VAC;  Surgeon: Lovett Sox, MD;  [facility];  Service: Thoracic;  Laterality: Left;   APPLICATION OF WOUND VAC N/A 06/20/2022   Procedure: VAD POWERCORD TUNNEL WOUND VAC CHANGE;  Surgeon: Lovett Sox, MD;  [facility];  Service: Thoracic;  Laterality: N/A;   APPLICATION OF WOUND VAC N/A 06/27/2022   Procedure: WOUND VAC CHANGE;  Surgeon: Lovett Sox, MD;  [facility];  Service: Thoracic;  Laterality: N/A;   APPLICATION OF WOUND VAC N/A 07/08/2022   Procedure: WOUND VAC CHANGE;  Surgeon: Lovett Sox, MD;  [facility];   Service: Thoracic;  Laterality: N/A;   APPLICATION OF WOUND VAC N/A 01/28/2023   Procedure: APPLICATION OF WOUND VAC;  Surgeon: Lovett Sox, MD;  [facility];  Service: Thoracic;  Laterality: N/A;   APPLICATION OF WOUND VAC N/A 02/05/2023   Procedure: WOUND VAC CHANGE;  Surgeon: Lovett Sox, MD;  [facility];  Service: Thoracic;  Laterality: N/A;   APPLICATION OF WOUND VAC N/A 02/12/2023   Procedure: REMOVAL OF WOUND VAC DRESSING;  Surgeon: Lovett Sox, MD;  [facility];  Service: Thoracic;  Laterality: N/A;   APPLICATION OF WOUND VAC N/A 07/09/2023   Procedure: APPLICATION OF WOUND VAC;  Surgeon: Lovett Sox, MD;  [facility];  Service: Thoracic;  Laterality: N/A;   BACK SURGERY     CLIPPING OF ATRIAL APPENDAGE N/A 11/26/2019   Procedure: Clipping Of Atrial Appendage using AtriCure 40 Clip;  Surgeon: [name], MD;  [facility];  Service: Open Heart Surgery;  Laterality: N/A;   CORONARY ARTERY BYPASS GRAFT N/A 11/26/2019   Procedure: CORONARY ARTERY BYPASS GRAFTING (CABG) using endoscopic greater saphenous vein harvest: svc to OM; svc to Diag; svc to PD; and LIMA to LAD.;  Surgeon: [name], MD;  [facility];  Service: Open Heart Surgery;  Laterality: N/A;   FOOT SURGERY     HAND SURGERY     INSERTION OF IMPLANTABLE LEFT VENTRICULAR ASSIST DEVICE N/A 11/17/2020   Procedure: INSERTION OF IMPLANTABLE LEFT VENTRICULAR ASSIST DEVICE - HM3;  Surgeon: [name], MD;  [facility];  Service: Open Heart Surgery;  Laterality: N/A;   INTRAOPERATIVE TRANSESOPHAGEAL ECHOCARDIOGRAM  12/04/2019   Procedure: Intraoperative Transesophageal Echocardiogram;  Surgeon: [name], MD;  [facility];  Service: Open Heart Surgery;;   MAZE N/A 11/26/2019   Procedure: MAZE using Bilateral Pulmonary Vein isolation.;  Surgeon: [name], MD;  [facility];  Service: Open Heart Surgery;  Laterality: N/A;   MULTIPLE EXTRACTIONS WITH  ALVEOLOPLASTY N/A 11/16/2020   Procedure: MULTIPLE EXTRACTION WITH ALVEOLOPLASTY;  Surgeon: Sharman Cheek, DMD;  [facility];  Service: Dentistry;  Laterality: N/A;   PLACEMENT OF IMPELLA LEFT VENTRICULAR ASSIST DEVICE N/A 11/24/2019   Procedure: PLACEMENT OF IMPELLA 5.5 LEFT VENTRICULAR ASSIST DEVICE;  Surgeon: [name], MD;  [facility];  Service: Open Heart Surgery;  Laterality: N/A;   PLACEMENT OF IMPELLA LEFT VENTRICULAR ASSIST DEVICE Left 11/10/2020   Procedure: PLACEMENT OF IMPELLA 5.5 LEFT VENTRICULAR ASSIST DEVICE VIA  LEFT AVILLARY ARTERY;  Surgeon: [name], MD;  [facility];  Service: Open Heart Surgery;  Laterality: Left;   REMOVAL OF IMPELLA LEFT VENTRICULAR ASSIST DEVICE Right 12/04/2019   Procedure: REMOVAL OF IMPELLA LEFT VENTRICULAR ASSIST DEVICE, right axilla;  Surgeon: [name], MD;  Location: [facility] OR;  Service: Open Heart Surgery;  Laterality: Right;   RIGHT/LEFT HEART CATH AND CORONARY ANGIOGRAPHY N/A 11/24/2019   Procedure: RIGHT/LEFT HEART CATH AND CORONARY ANGIOGRAPHY;  Surgeon: Laurey Morale, MD;  Location: [facility] INVASIVE CV LAB;  Service: Cardiovascular;  Laterality: N/A;   RIGHT/LEFT HEART CATH AND CORONARY/GRAFT ANGIOGRAPHY N/A 11/09/2020   Procedure: RIGHT/LEFT HEART  CATH AND CORONARY/GRAFT ANGIOGRAPHY;  Surgeon: [name], MD;  [facility];  Service: Cardiovascular;  Laterality: N/A;   STERNAL WOUND DEBRIDEMENT N/A 05/16/2022   Procedure: VAD DRIVELINE DEBRIDEMENT;  Surgeon: Lovett Sox, MD;  [facility];  Service: Thoracic;  Laterality: N/A;  VANC PULSE LAVAGE   STERNAL WOUND DEBRIDEMENT Left 06/14/2022   Procedure: DEBRIDEMENT OF VAD POWERCORD TUNNEL;  Surgeon: Lovett Sox, MD;  [facility];  Service: Thoracic;  Laterality: Left;   STERNAL WOUND DEBRIDEMENT N/A 06/27/2022   Procedure: ABDOMINAL WOUND DEBRIDEMENT, APPLICATION OF MYRIAD;  Surgeon: Lovett Sox, MD;  [facility];  Service:  Thoracic;  Laterality: N/A;   STERNAL WOUND DEBRIDEMENT N/A 07/08/2022   Procedure: ABDOMINAL WOUND DEBRIDEMENT;  Surgeon: Lovett Sox, MD;  [facility];  Service: Thoracic;  Laterality: N/A;   STERNAL WOUND DEBRIDEMENT N/A 01/28/2023   Procedure: ABDOMINAL DRIVELINE WOUND DEBRIDEMENT;  Surgeon: Lovett Sox, MD;  [facility];  Service: Thoracic;  Laterality: N/A;   STERNAL WOUND DEBRIDEMENT N/A 02/05/2023   Procedure: ABDOMINAL DRIVELINE WOUND DEBRIDEMENT;  Surgeon: Lovett Sox, MD;  [facility];  Service: Thoracic;  Laterality: N/A;   STERNAL WOUND DEBRIDEMENT N/A 02/12/2023   Procedure: ABDOMINAL DRIVELINE WOUND DEBRIDEMENT AND APPLICATION OF MATRIX WOUND DRESSING;  Surgeon: Lovett Sox, MD;  [facility];  Service: Thoracic;  Laterality: N/A;   STERNAL WOUND DEBRIDEMENT N/A 07/09/2023   Procedure: DRIVELINE WOUND DEBRIDEMENT;  Surgeon: Lovett Sox, MD;  [facility];  Service: Thoracic;  Laterality: N/A;   STERNAL WOUND DEBRIDEMENT N/A 07/17/2023   Procedure: DRIVELINE WOUND DEBRIDEMENT;  Surgeon: Lovett Sox, MD;  [facility];  Service: Thoracic;  Laterality: N/A;   TEE WITHOUT CARDIOVERSION N/A 11/24/2019   Procedure: TRANSESOPHAGEAL ECHOCARDIOGRAM (TEE);  Surgeon: [name], MD;  Location: [facility] OR;  Service: Open Heart Surgery;  Laterality: N/A;   TEE WITHOUT CARDIOVERSION N/A 11/26/2019   Procedure: TRANSESOPHAGEAL ECHOCARDIOGRAM (TEE);  Surgeon: [name], MD;  Location: [facility] OR;  Service: Open Heart Surgery;  Laterality: N/A;   TEE WITHOUT CARDIOVERSION N/A 11/10/2020   Procedure: TRANSESOPHAGEAL ECHOCARDIOGRAM (TEE);  Surgeon: [name], MD;  Location: [facility] OR;  Service: Open Heart Surgery;  Laterality: N/A;   TEE WITHOUT CARDIOVERSION N/A 11/17/2020   Procedure: TRANSESOPHAGEAL ECHOCARDIOGRAM (TEE);  Surgeon: [name], MD;  Location: [facility] OR;  Service: Open Heart Surgery;  Laterality: N/A;   WOUND EXPLORATION N/A 05/31/2022    Procedure: WOUND EXPLORATION, Debridement and Irrigation of abdominal wound;  Surgeon: Lovett Sox, MD;  [facility];  Service: Thoracic;  Laterality: N/A;   Vitals BP (!) 85/73 (BP Location: Left Arm)   Pulse 91   Temp 98.3 F (36.8 C) (Oral)   Resp 20   Ht 5\' 10"  (1.778 m)   Wt 82.8 kg   SpO2 93%   BMI 26.19 kg/m     Physical Exam Constitutional: Adult male lying in the bed, nontoxic appearing, NAD    Comments: HEENT WNL  Cardiovascular:     Rate and Rhythm: Mechanical heart sounds    Heart sounds: LVAD hum  Pulmonary:     Effort: Pulmonary effort is normal.     Comments: Normal breath sounds  Abdominal:     Palpations: Abdomen is soft.     Tenderness: Nondistended, overlying bandage C/D/I, large ventral hernia without signs of obstruction or strangulation  Musculoskeletal:        General: No swelling or tenderness in peripheral  joints Skin:    Comments: No rashes  Neurological:     General: Awake, alert and oriented, following commands  Psychiatric:        Mood and Affect: Mood normal.   Pertinent Microbiology Results for orders placed or performed during the hospital encounter of 10/08/23  Blood culture (routine x 2)     Status: Abnormal   Collection Time: 10/09/23  1:34 AM   Specimen: BLOOD RIGHT FOREARM  Result Value Ref Range Status   Specimen Description BLOOD RIGHT FOREARM  Final   Special Requests   Final    BOTTLES DRAWN AEROBIC AND ANAEROBIC Blood Culture results may not be optimal due to an excessive volume of blood received in culture bottles   Culture  Setup Time   Final    GRAM NEGATIVE RODS AEROBIC BOTTLE ONLY CRITICAL RESULT CALLED TO, READ BACK BY AND VERIFIED WITH: PHARMD J. LEDFORD 10/10/23 @ 0023 BY AB Performed at [facility], [address] [place], [place] [address]    Culture PSEUDOMONAS AERUGINOSA (A)  Final   Report Status 10/12/2023 FINAL  Final   Organism ID, Bacteria PSEUDOMONAS AERUGINOSA  Final       Susceptibility   Pseudomonas aeruginosa - MIC*    CEFTAZIDIME 16 INTERMEDIATE Intermediate     CIPROFLOXACIN >=4 RESISTANT Resistant     GENTAMICIN <=1 SENSITIVE Sensitive     IMIPENEM 2 SENSITIVE Sensitive     PIP/TAZO >=128 RESISTANT Resistant ug/mL    * PSEUDOMONAS AERUGINOSA  Blood culture (routine x 2)     Status: Abnormal   Collection Time: 10/09/23  1:34 AM   Specimen: BLOOD LEFT FOREARM  Result Value Ref Range Status   Specimen Description BLOOD LEFT FOREARM  Final   Special Requests   Final    BOTTLES DRAWN AEROBIC AND ANAEROBIC Blood Culture results may not be optimal due to an excessive volume of blood received in culture bottles   Culture  Setup Time   Final    GRAM NEGATIVE RODS AEROBIC BOTTLE ONLY CRITICAL VALUE NOTED.  VALUE IS CONSISTENT WITH PREVIOUSLY REPORTED AND CALLED VALUE.    Culture (A)  Final    PSEUDOMONAS AERUGINOSA SUSCEPTIBILITIES PERFORMED ON PREVIOUS CULTURE WITHIN THE LAST 5 DAYS. Performed at [facility], [address] [place], [place] [address]    Report Status 10/12/2023 FINAL  Final  Blood Culture ID Panel (Reflexed)     Status: Abnormal   Collection Time: 10/09/23  1:34 AM  Result Value Ref Range Status   Enterococcus faecalis NOT DETECTED NOT DETECTED Final   Enterococcus Faecium NOT DETECTED NOT DETECTED Final   Listeria monocytogenes NOT DETECTED NOT DETECTED Final   Staphylococcus species NOT DETECTED NOT DETECTED Final   Staphylococcus aureus (BCID) NOT DETECTED NOT DETECTED Final   Staphylococcus epidermidis NOT DETECTED NOT DETECTED Final   Staphylococcus lugdunensis NOT DETECTED NOT DETECTED Final   Streptococcus species NOT DETECTED NOT DETECTED Final   Streptococcus agalactiae NOT DETECTED NOT DETECTED Final   Streptococcus pneumoniae NOT DETECTED NOT DETECTED Final   Streptococcus pyogenes NOT DETECTED NOT DETECTED Final   A.calcoaceticus-baumannii NOT DETECTED NOT DETECTED Final   Bacteroides fragilis NOT DETECTED NOT  DETECTED Final   Enterobacterales NOT DETECTED NOT DETECTED Final   Enterobacter cloacae complex NOT DETECTED NOT DETECTED Final   Escherichia coli NOT DETECTED NOT DETECTED Final   Klebsiella aerogenes NOT DETECTED NOT DETECTED Final   Klebsiella oxytoca NOT DETECTED NOT DETECTED Final   Klebsiella  pneumoniae NOT DETECTED NOT DETECTED Final   Proteus species NOT DETECTED NOT DETECTED Final   Salmonella species NOT DETECTED NOT DETECTED Final   Serratia marcescens NOT DETECTED NOT DETECTED Final   Haemophilus influenzae NOT DETECTED NOT DETECTED Final   Neisseria meningitidis NOT DETECTED NOT DETECTED Final   Pseudomonas aeruginosa DETECTED (A) NOT DETECTED Final    Comment: CRITICAL RESULT CALLED TO, READ BACK BY AND VERIFIED WITH: PHARMD J. LEDFORD 10/10/23 @ 0023 BY AB    Stenotrophomonas maltophilia NOT DETECTED NOT DETECTED Final   Candida albicans NOT DETECTED NOT DETECTED Final   Candida auris NOT DETECTED NOT DETECTED Final   Candida glabrata NOT DETECTED NOT DETECTED Final   Candida krusei NOT DETECTED NOT DETECTED Final   Candida parapsilosis NOT DETECTED NOT DETECTED Final   Candida tropicalis NOT DETECTED NOT DETECTED Final   Cryptococcus neoformans/gattii NOT DETECTED NOT DETECTED Final   CTX-M ESBL NOT DETECTED NOT DETECTED Final   Carbapenem resistance IMP NOT DETECTED NOT DETECTED Final   Carbapenem resistance KPC NOT DETECTED NOT DETECTED Final   Carbapenem resistance NDM NOT DETECTED NOT DETECTED Final   Carbapenem resistance VIM NOT DETECTED NOT DETECTED Final    Comment: Performed at [facility], [address], [place], [place] [address]   Pertinent Lab.    Latest Ref Rng & Units 10/13/2023    5:20 AM 10/12/2023    6:30 AM 10/11/2023   12:33 PM  CBC  WBC 4.0 - 10.5 K/uL 14.5  10.3  10.1   Hemoglobin 13.0 - 17.0 g/dL 9.9  9.4  9.0   Hematocrit 39.0 - 52.0 % 30.9  29.2  28.2   Platelets 150 - 400 K/uL 267  250  252       Latest Ref Rng & Units  10/13/2023    5:20 AM 10/12/2023    6:30 AM 10/11/2023   12:33 PM  CMP  Glucose 70 - 99 mg/dL 191  478  295   BUN 8 - 23 mg/dL 25  34  34   Creatinine 0.61 - 1.24 mg/dL 6.21  3.08  6.57   Sodium 135 - 145 mmol/L 136  136  135   Potassium 3.5 - 5.1 mmol/L 4.6  4.2  4.1   Chloride 98 - 111 mmol/L 103  100  104   CO2 22 - 32 mmol/L 25  25  22    Calcium 8.9 - 10.3 mg/dL 8.5  8.5  8.4      Pertinent Imaging today Plain films and CT images have been personally visualized and interpreted; radiology reports have been reviewed. Decision making incorporated into the Impression /   CT LUMBAR SPINE WO CONTRAST  Result Date: 10/12/2023 CLINICAL DATA:  Initial evaluation for infection. EXAM: CT LUMBAR SPINE WITHOUT CONTRAST TECHNIQUE: Multidetector CT imaging of the lumbar spine was performed without intravenous contrast administration. Multiplanar CT image reconstructions were also generated. RADIATION DOSE REDUCTION: This exam was performed according to the departmental dose-optimization program which includes automated exposure control, adjustment of the mA and/or kV according to patient size and/or use of iterative reconstruction technique. COMPARISON:  Prior CT from 10/09/2023. FINDINGS: Segmentation: Standard. Lowest well-formed disc space labeled the L5-S1 level. Alignment: Physiologic with preservation of the normal lumbar lordosis. No listhesis. Vertebrae: Vertebral body height maintained without acute or chronic fracture. Visualized sacrum and pelvis intact. No discrete or worrisome osseous lesions. Postoperative changes from prior PLIF at L4-5 and L5-S1. Hardware intact. No periprosthetic lucency to suggest  loosening or infection. The left transpedicular screws at L4 and L5 are positioned lateral to the L4 and L5 vertebral bodies respectively. No other findings to suggest osteomyelitis discitis or septic arthritis. Paraspinal and other soft tissues: Chronic postoperative scarring within the posterior  paraspinous soft tissues. No paraspinous inflammatory changes or other abnormality. No visible collections. Nonobstructive left renal nephrolithiasis. Advanced aorto bi-iliac atherosclerotic disease. Disc levels: L1-2: Degenerative intervertebral disc space narrowing with diffuse disc bulge and disc desiccation. Reactive endplate spurring. Superimposed left foraminal to extraforaminal disc protrusion (series 5, image 25). Mild facet hypertrophy. No spinal stenosis. Mild left foraminal narrowing. Right neural foramen remains patent. L2-3: Diffuse disc bulge with reactive endplate spurring. Moderate facet hypertrophy. Resultant mild spinal stenosis. Mild left L2 foraminal narrowing. Right neural foramen remains patent. L3-4: Degenerative disc space narrowing with diffuse disc bulge. Reactive endplate spurring. Superimposed right subarticular to foraminal disc protrusion (series 5, image 55). Moderate bilateral facet hypertrophy. Mild left with severe right lateral recess stenosis. Mild to moderate right worse than left L3 foraminal narrowing. L4-5: Sequelae of prior PLIF. Evaluation of the spinal canal and neural foramina limited due to extensive streak artifact. L5-S1: Prior PLIF. Evaluation of the spinal canal and neural foramina limited by extensive streak artifact. No visible epidural collections. IMPRESSION: 1. No CT evidence for acute infection within the lumbar spine. 2. Postoperative changes from prior PLIF at L4-5 and L5-S1. No evidence for hardware complication. 3. Right subarticular to foraminal disc protrusion at L3-4. Either of the right L3 or descending L4 nerve roots could be affected. 4. Nonobstructive left renal nephrolithiasis. 5.  Aortic Atherosclerosis (ICD10-I70.0). Electronically Signed   By: Rise Mu M.D.   On: 10/12/2023 03:20   CT THORACIC SPINE WO CONTRAST  Result Date: 10/12/2023 CLINICAL DATA:  Initial evaluation for infection. EXAM: CT THORACIC SPINE WITHOUT CONTRAST  TECHNIQUE: Multidetector CT images of the thoracic were obtained using the standard protocol without intravenous contrast. RADIATION DOSE REDUCTION: This exam was performed according to the departmental dose-optimization program which includes automated exposure control, adjustment of the mA and/or kV according to patient size and/or use of iterative reconstruction technique. COMPARISON:  Prior CT from 10/09/2023. FINDINGS: Alignment: Physiologic with preservation of the normal thoracic kyphosis. No listhesis. Vertebrae: Vertebral body height maintained without acute or chronic fracture. No discrete or worrisome osseous lesions. No CT findings to suggest osteomyelitis discitis or septic arthritis. Paraspinal and other soft tissues: Paraspinous soft tissues demonstrate no acute finding. Small layering left pleural effusion noted. Emphysema noted. Aortic atherosclerosis. Nonobstructive left renal nephrolithiasis. Disc levels: Moderate degenerative intervertebral disc space narrowing with endplate spurring present throughout the mid and lower thoracic spine at T4-5 through T11-12. No significant spinal stenosis by CT. No visible epidural collections. IMPRESSION: 1. No CT evidence for acute infection within the thoracic spine. 2. Moderate degenerative spondylosis throughout the mid and lower thoracic spine without significant spinal stenosis. 3. Small layering left pleural effusion. 4. Nonobstructive left renal nephrolithiasis. Aortic Atherosclerosis (ICD10-I70.0) and Emphysema (ICD10-J43.9). Electronically Signed   By: Rise Mu M.D.   On: 10/12/2023 03:04   ECHOCARDIOGRAM COMPLETE  Result Date: 10/11/2023    ECHOCARDIOGRAM REPORT   Patient Name:   Kevin Underwood. Date of Exam: 10/11/2023 Medical Rec #:  [id number]            Height:       70.0 in Accession #:    [id number]           Weight:  185.4 lb Date of Birth:  02-09-1961            BSA:          2.021 m Patient Age:    62 years              BP:           117/85 mmHg Patient Gender: M                    HR:           71 bpm. Exam Location:  Inpatient Procedure: 2D Echo, Cardiac Doppler and Color Doppler Indications:    Endocarditis.  History:        Patient has prior history of Echocardiogram examinations, most                 recent 09/25/2023. Cardiomyopathy and CHF, Abnormal ECG, COPD,                 Arrythmias:Atrial Fibrillation; Risk Factors:Sleep Apnea,                 Dyslipidemia and Hypertension. Drive line infection. Cardiac                 shock. LVAD. ETOH.  Sonographer:    Sheralyn Boatman RDCS Referring Phys: [id number] [facility]  Sonographer Comments: Technically difficult study due to poor echo windows. LVAD device present. IMPRESSIONS  1. Septal hypokinesis with mild to moderate LV dysfunction; LVAD at apex.  2. Left ventricular ejection fraction, by estimation, is 40 to 45%. The left ventricle has mildly decreased function. The left ventricle demonstrates regional wall motion abnormalities (see scoring diagram/findings for description). There is severe left  ventricular hypertrophy. Left ventricular diastolic parameters are indeterminate.  3. Right ventricular systolic function is moderately reduced. The right ventricular size is moderately enlarged. Tricuspid regurgitation signal is inadequate for assessing PA pressure.  4. Right atrial size was mildly dilated.  5. The mitral valve is normal in structure. No evidence of mitral valve regurgitation. No evidence of mitral stenosis.  6. The aortic valve has an indeterminant number of cusps. Aortic valve regurgitation is not visualized.  7. Aortic dilatation noted. There is mild dilatation of the aortic root, measuring 41 mm. There is mild dilatation of the ascending aorta, measuring 40 mm.  8. The inferior vena cava is normal in size with greater than 50% respiratory variability, suggesting right atrial pressure of 3 mmHg. FINDINGS  Left Ventricle: Left ventricular ejection fraction, by  estimation, is 40 to 45%. The left ventricle has mildly decreased function. The left ventricle demonstrates regional wall motion abnormalities. The left ventricular internal cavity size was normal in size. There is severe left ventricular hypertrophy. Left ventricular diastolic parameters are indeterminate. Right Ventricle: The right ventricular size is moderately enlarged. Right ventricular systolic function is moderately reduced. Tricuspid regurgitation signal is inadequate for assessing PA pressure. The tricuspid regurgitant velocity is 2.55 m/s, and with an assumed right atrial pressure of 3 mmHg, the estimated right ventricular systolic pressure is 29.0 mmHg. Left Atrium: Left atrial size was normal in size. Right Atrium: Right atrial size was mildly dilated. Pericardium: There is no evidence of pericardial effusion. Mitral Valve: The mitral valve is normal in structure. No evidence of mitral valve regurgitation. No evidence of mitral valve stenosis. Tricuspid Valve: The tricuspid valve is normal in structure. Tricuspid valve regurgitation is mild . No evidence of tricuspid stenosis. Aortic Valve: The aortic valve has  an indeterminant number of cusps. Aortic valve regurgitation is not visualized. Pulmonic Valve: The pulmonic valve was normal in structure. Pulmonic valve regurgitation is not visualized. No evidence of pulmonic stenosis. Aorta: Aortic dilatation noted. There is mild dilatation of the aortic root, measuring 41 mm. There is mild dilatation of the ascending aorta, measuring 40 mm. Venous: The inferior vena cava is normal in size with greater than 50% respiratory variability, suggesting right atrial pressure of 3 mmHg. IAS/Shunts: No atrial level shunt detected by color flow Doppler. Additional Comments: Septal hypokinesis with mild to moderate LV dysfunction; LVAD at apex.  LEFT VENTRICLE PLAX 2D LVIDd:         3.95 cm Diastology LVIDs:         3.00 cm LV e' medial:    4.90 cm/s LV PW:         1.85  cm LV E/e' medial:  13.4 LV IVS:        1.70 cm LV e' lateral:   6.09 cm/s                        LV E/e' lateral: 10.8  RIGHT VENTRICLE            IVC RV S prime:     5.55 cm/s  IVC diam: 1.40 cm TAPSE (M-mode): 0.5 cm LEFT ATRIUM             Index        RIGHT ATRIUM           Index LA diam:        4.30 cm 2.13 cm/m   RA Area:     11.70 cm LA Vol (A2C):   20.5 ml 10.14 ml/m  RA Volume:   20.50 ml  10.14 ml/m LA Vol (A4C):   26.6 ml 13.16 ml/m LA Biplane Vol: 23.7 ml 11.73 ml/m   AORTA Ao Root diam: 4.10 cm Ao Asc diam:  3.85 cm MITRAL VALVE               TRICUSPID VALVE MV Area (PHT): 4.49 cm    TR Peak grad:   26.0 mmHg MV Decel Time: 169 msec    TR Vmax:        255.00 cm/s MV E velocity: 65.50 cm/s MV A velocity: 68.70 cm/s MV E/A ratio:  0.95 Olga Millers MD Electronically signed by Olga Millers MD Signature Date/Time: 10/11/2023/11:34:15 AM    Final    [place] EKG SITE RITE  Result Date: 10/10/2023 If Site Rite image not attached, placement could not be confirmed due to current cardiac rhythm.  CT CHEST ABDOMEN PELVIS W CONTRAST  Result Date: 10/09/2023 CLINICAL DATA:  LVAD patient.  Blunt polytrauma. EXAM: CT CHEST, ABDOMEN, AND PELVIS WITH CONTRAST TECHNIQUE: Multidetector CT imaging of the chest, abdomen and pelvis was performed following the standard protocol during bolus administration of intravenous contrast. RADIATION DOSE REDUCTION: This exam was performed according to the departmental dose-optimization program which includes automated exposure control, adjustment of the mA and/or kV according to patient size and/or use of iterative reconstruction technique. CONTRAST:  75mL OMNIPAQUE IOHEXOL 350 MG/ML SOLN COMPARISON:  Chest radiograph 10/08/2023; CT abdomen and pelvis 09/23/2023 and CT chest abdomen and pelvis 09/08/2023 FINDINGS: CT CHEST FINDINGS Cardiovascular: LVAD is in unchanged configuration compared with 09/08/2023. Similar soft tissue thickening about the drive line. No fluid  collection. No pericardial effusion. Coronary artery and aortic atherosclerotic calcification. No evidence of acute aortic injury.  Left atrial appendage occlusion device. Mediastinum/Nodes: Trachea and esophagus are unremarkable. No mediastinal hematoma. Lungs/Pleura: Emphysema. Bronchial wall thickening greatest in the lower lobes. Bibasilar atelectasis/scarring. No pneumothorax. Musculoskeletal: Sternotomy.  No acute fracture. CT ABDOMEN PELVIS FINDINGS Hepatobiliary: Hepatic steatosis. Unremarkable gallbladder and biliary tree. Pancreas: Fatty atrophy without acute abnormality. Spleen: Unremarkable. Adrenals/Urinary Tract: Normal adrenal glands. Streak artifact obscures a portion of the mid right ureter. No hydronephrosis. Nonobstructing left nephrolithiasis. Unremarkable bladder. Stomach/Bowel: Normal caliber large and small bowel. No bowel wall thickening. The appendix is not visualized. The transverse colon and a loop of small bowel herniate into the large anterior abdominal wall hernia. Vascular/Lymphatic: Aortic atherosclerosis. No enlarged abdominal or pelvic lymph nodes. Reproductive: Unremarkable. Other: No free intraperitoneal fluid or air. Musculoskeletal: Posterior fusion L4-S1.  No acute fracture. IMPRESSION: 1. No evidence of acute traumatic injury in the chest, abdomen, or pelvis. 2. LVAD is in unchanged in configuration compared with 09/08/2023. Similar thickening about the drive line. 3. Emphysema and chronic bronchitis. 4. Hepatic steatosis. 5. Nonobstructing left nephrolithiasis. 6. Large broad-based anterior abdominal wall hernia containing nonobstructed transverse colon and small bowel. Aortic Atherosclerosis (ICD10-I70.0) and Emphysema (ICD10-J43.9). Electronically Signed   By: Minerva Fester M.D.   On: 10/09/2023 03:12   DG Chest Portable 1 View  Result Date: 10/09/2023 CLINICAL DATA:  Shortness of breath EXAM: PORTABLE CHEST 1 VIEW COMPARISON:  Chest x-ray 09/23/2023 FINDINGS: LVAD  again noted. Sternotomy wires are present. The heart is enlarged, unchanged. There is no focal lung infiltrate, pleural effusion or pneumothorax. There surgical clips in the bilateral axillary regions. There is a healed left clavicular fracture. No acute fractures are seen. IMPRESSION: 1. No active disease. 2. Cardiomegaly. Electronically Signed   By: Darliss Cheney M.D.   On: 10/09/2023 01:02   ECHOCARDIOGRAM COMPLETE  Result Date: 09/25/2023    ECHOCARDIOGRAM REPORT   Patient Name:   Kevin Underwood. Date of Exam: 09/25/2023 Medical Rec #:  [id number]            Height:       70.0 in Accession #:    [id number]           Weight:       186.7 lb Date of Birth:  [birth year]            BSA:          2.027 m Patient Age:    62 years             BP:           104/82 mmHg Patient Gender: M                    HR:           100 bpm. Exam Location:  Inpatient Procedure: 2D Echo, Color Doppler and Cardiac Doppler Indications:    LVAD, Endocarditis  History:        Patient has prior history of Echocardiogram examinations, most                 recent 06/17/2022. LVAD and CHF, CAD, Prior CABG, COPD; Risk                 Factors:Dyslipidemia and h/o polysubstance use.  Sonographer:    Milbert Coulter Referring Phys: [id number] PETER VANTRIGT  Sonographer Comments: Technically difficult study due to poor echo windows, no apical window and no subcostal window. Image acquisition challenging due to patient body habitus and Image acquisition challenging  due to COPD. IMPRESSIONS  1. LVAD cannula in apex. Interventricular septum is midline. Aortic valve with minimal opening. Left ventricular ejection fraction, by estimation, is 20 to 25%. The left ventricle has severely decreased function. The left ventricle demonstrates global hypokinesis. There is mild concentric left ventricular hypertrophy. Left ventricular diastolic parameters are indeterminate.  2. Right ventricular systolic function is moderately reduced. The right ventricular size  is moderately enlarged.  3. Right atrial size was mildly dilated.  4. The mitral valve was not well visualized. Trivial mitral valve regurgitation. No evidence of mitral stenosis.  5. The aortic valve was not well visualized. There is mild calcification of the aortic valve. Aortic valve regurgitation is not visualized. No aortic stenosis is present. Conclusion(s)/Recommendation(s): Technically very limited study due to poor sound wave transmission. FINDINGS  Left Ventricle: LVAD cannula in apex. Interventricular septum is midline. Aortic valve with minimal opening. Left ventricular ejection fraction, by estimation, is 20 to 25%. The left ventricle has severely decreased function. The left ventricle demonstrates global hypokinesis. The left ventricular internal cavity size was normal in size. There is mild concentric left ventricular hypertrophy. Left ventricular diastolic parameters are indeterminate. Right Ventricle: The right ventricular size is moderately enlarged. No increase in right ventricular wall thickness. Right ventricular systolic function is moderately reduced. Left Atrium: Left atrial size was normal in size. Right Atrium: Right atrial size was mildly dilated. Pericardium: There is no evidence of pericardial effusion. Mitral Valve: The mitral valve was not well visualized. Trivial mitral valve regurgitation. No evidence of mitral valve stenosis. Tricuspid Valve: The tricuspid valve is not well visualized. Tricuspid valve regurgitation is mild . No evidence of tricuspid stenosis. Aortic Valve: The aortic valve was not well visualized. There is mild calcification of the aortic valve. Aortic valve regurgitation is not visualized. No aortic stenosis is present. Pulmonic Valve: The pulmonic valve was normal in structure. Pulmonic valve regurgitation is trivial. No evidence of pulmonic stenosis. Aorta: The aortic root is normal in size and structure. Venous: The inferior vena cava was not well visualized.  IAS/Shunts: No atrial level shunt detected by color flow Doppler.  LEFT VENTRICLE PLAX 2D LVIDd:         4.50 cm LVIDs:         3.90 cm LV PW:         1.30 cm LV IVS:        1.20 cm  Arvilla Meres MD Electronically signed by Arvilla Meres MD Signature Date/Time: 09/25/2023/6:33:50 PM    Final    CT CERVICAL SPINE WO CONTRAST  Result Date: 09/25/2023 CLINICAL DATA:  Neck pain, infection or inflammation suspected. EXAM: CT CERVICAL SPINE WITHOUT CONTRAST TECHNIQUE: Multidetector CT imaging of the cervical spine was performed without intravenous contrast. Multiplanar CT image reconstructions were also generated. RADIATION DOSE REDUCTION: This exam was performed according to the departmental dose-optimization program which includes automated exposure control, adjustment of the mA and/or kV according to patient size and/or use of iterative reconstruction technique. COMPARISON:  None Available. FINDINGS: Alignment: There is straightening of the normal cervical lordosis. There is no antero or retrolisthesis. Skull base and vertebrae: Skull base alignment is maintained. Vertebral body heights are preserved. There is no evidence of acute fracture. There is no suspicious osseous lesion. There is no osseous erosion or destruction. There is fusion across the left posterior elements at C2-C3. Soft tissues and spinal canal: No prevertebral fluid or swelling. No visible canal hematoma. Disc levels: There is moderate disc space narrowing at  C5-C6 and C6-C7. C2-C3: Moderate left facet arthropathy without significant spinal canal or neural foraminal stenosis. C3-C4: There is mild left worse than right uncovertebral ridging and moderate left facet arthropathy resulting in moderate left and no significant right neural foraminal stenosis and no significant spinal canal stenosis. C4-C5: There is a shallow disc protrusion and mild-to-moderate left and mild right facet arthropathy without significant spinal canal or neural  foraminal stenosis. C5-C6: There is a shallow disc protrusion and mild uncovertebral ridging without significant spinal canal or neural foraminal stenosis. C6-C7: There is a shallow disc protrusion and left worse than right uncovertebral ridging resulting in moderate left and mild right neural foraminal stenosis and mild spinal canal stenosis. C7-T1: No significant spinal canal or neural foraminal stenosis. Upper chest: There is emphysema in the lung apices. Other: None. IMPRESSION: 1. No acute finding in the cervical spine. No CT evidence of infection. If there is high clinical concern, MRI with contrast is recommended. 2. Multilevel degenerative changes as detailed above with up to mild spinal canal stenosis and moderate left neural foraminal stenosis at C6-C7. Electronically Signed   By: Lesia Hausen M.D.   On: 09/25/2023 15:48   CT ABDOMEN PELVIS W CONTRAST  Result Date: 09/23/2023 CLINICAL DATA:  Nausea vomiting diarrhea EXAM: CT ABDOMEN AND PELVIS WITH CONTRAST TECHNIQUE: Multidetector CT imaging of the abdomen and pelvis was performed using the standard protocol following bolus administration of intravenous contrast. RADIATION DOSE REDUCTION: This exam was performed according to the departmental dose-optimization program which includes automated exposure control, adjustment of the mA and/or kV according to patient size and/or use of iterative reconstruction technique. CONTRAST:  75mL OMNIPAQUE IOHEXOL 350 MG/ML SOLN COMPARISON:  CT 09/08/2023, 07/07/2023, 01/27/2023, 06/14/2022 FINDINGS: Lower chest: Lung bases demonstrate no acute airspace disease. Artifact from LVAD. Hepatobiliary: No focal liver abnormality is seen. No gallstones, gallbladder wall thickening, or biliary dilatation. Pancreas: Unremarkable. No pancreatic ductal dilatation or surrounding inflammatory changes. Spleen: Normal in size without focal abnormality. Adrenals/Urinary Tract: Adrenal glands are stable in appearance. Punctate  nonobstructing left kidney stone. Subcentimeter hypodensities too small to further characterize, no imaging follow-up is recommended. No hydronephrosis. The bladder is unremarkable. Stomach/Bowel: Stomach is nondistended. Mild fluid distension of left upper quadrant jejunal bowel loops without transition point suggestive of mild ileus or enteritis. No acute bowel wall thickening. Otherwise decompressed small bowel. Negative appendix Vascular/Lymphatic: Advanced aortic atherosclerosis. No aneurysm. No suspicious lymph nodes Reproductive: Negative prostate Other: Negative for pelvic effusion or free air. Diastasis of the anterior abdominal wall musculature with wide neck large hernia containing mesentery and bowel but no obstruction at this time. Residual soft tissue thickening about the drive line in the upper anterior abdomen without new fluid. Musculoskeletal: Posterior spinal hardware at the lumbosacral spine. Multilevel degenerative changes. No acute osseous abnormality IMPRESSION: 1. Mild fluid distension of left upper quadrant jejunal bowel loops without discrete transition point suggestive of mild ileus or enteritis. 2. Otherwise no CT evidence for acute intra-abdominal or pelvic abnormality. LVAD with similar skin thickening and circumferential soft tissue thickening about the drive line in the upper abdomen without development of new fluid around the drive line since the prior exam. 3. 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 Aortic atherosclerosis. Aortic Atherosclerosis (ICD10-I70.0). Electronically Signed   By: Jasmine Pang M.D.   On: 09/23/2023 17:12   DG Chest Portable 1 View  Result Date: 09/23/2023 CLINICAL DATA:  Shortness of breath.  EXAM: PORTABLE CHEST 1 VIEW COMPARISON:  09/08/2023. FINDINGS: Similar cardiomediastinal silhouette. LVAD in similar location. Median sternotomy. Atrial appendage clip. No  consolidation. No visible pleural effusions or pneumothorax. Remote left clavicular fracture. Polyarticular degenerative change. IMPRESSION: No evidence of acute cardiopulmonary disease. Electronically Signed   By: Feliberto Harts M.D.   On: 09/23/2023 15:27    I have personally spent 52  minutes involved in face-to-face and non-face-to-face activities for this patient on the day of the visit. Professional time spent includes the following activities: Preparing to see the patient (review of tests), Obtaining and/or reviewing separately obtained history (admission/discharge record), Performing a medically appropriate examination and/or evaluation , Ordering medications/tests/procedures, referring and communicating with other health care professionals, Documenting clinical information in the EMR, Independently interpreting results (not separately reported), Communicating results to the patient/family/caregiver, Counseling and educating the patient/family/caregiver and Care coordination (not separately reported).   Plan d/w requesting provider as well as ID pharm D  Of note, portions of this note may have been created with voice recognition software. While this note has been edited for accuracy, occasional wrong-word or 'sound-a-like' substitutions may have occurred due to the inherent limitations of voice recognition software.   Electronically signed by:   Odette Fraction, MD Infectious Disease Physician [facility] Pager: [phone]

## 2023-10-14 NOTE — Progress Notes (Signed)
  Subjective: Patient resting comfortably Denies pain Right arm PICC line has been removed and blood cultures are negative Patient will need IV meropenem suppression of resistant Pseudomonas organism.  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 He understands that with no oral antibiotic option he is in a situation where long-term survival is limited. We will request palliative care to help Kevin Underwood navigate through the end-of-life issues and provide guidance and support on  a journey that no one  should travel alone.  Objective: Vital signs in last 24 hours: Temp:  [97.7 F (36.5 C)-98.3 F (36.8 C)] 97.7 F (36.5 C) (11/05 0729) Pulse Rate:  [85-96] 96 (11/05 0904) Cardiac Rhythm: Normal sinus rhythm (11/05 0700) Resp:  [14-20] 18 (11/05 0729) BP: (85-126)/(71-89) 105/71 (11/05 0904) SpO2:  [93 %-100 %] 100 % (11/05 0543) Weight:  [83.7 kg] 83.7 kg (11/05 0543)  Hemodynamic parameters for last 24 hours:  Sinus rhythm, afebrile  Intake/Output from previous day: 11/04 0701 - 11/05 0700 In: 780 [P.O.:380; IV Piggyback:400] Out: 1825 [Urine:1825] Intake/Output this shift: No intake/output data recorded.  Exam Alert and comfortable Lungs clear Normal VAD hum VAD tunnel dressing dry and intact Abdomen nontender  Lab Results: Recent Labs    10/13/23 0520 10/14/23 0801  WBC 14.5* 12.5*  HGB 9.9* 10.0*  HCT 30.9* 31.6*  PLT 267 258   BMET:  Recent Labs    10/13/23 0520 10/14/23 0801  NA 136 133*  K 4.6 4.5  CL 103 99  CO2 25 22  GLUCOSE 159* 262*  BUN 25* 27*  CREATININE 1.50* 1.35*  CALCIUM 8.5* 8.3*    PT/INR:  Recent Labs    10/14/23 0801  LABPROT 27.1*  INR 2.5*   ABG    Component Value Date/Time   PHART 7.508 (H) 11/20/2020 0438   HCO3 24.8 09/09/2021 0313   TCO2 21 (L) 09/23/2023 1219   ACIDBASEDEF 6.0 (H) 11/24/2019 1718    O2SAT 63.4 09/09/2021 0313   CBG (last 3)  Recent Labs    10/13/23 1619 10/13/23 2117 10/14/23 0558  GLUCAP 244* 187* 150*    Assessment/Plan: 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 Patient will be set up for home IV antibiotics after his PICC line is replaced.   LOS: 5 days    Kevin Underwood 10/14/2023

## 2023-10-14 NOTE — TOC Progression Note (Addendum)
Transition of Care (TOC) - Progression Note    Patient Details  Name: Kevin Underwood. MRN: [id number] Date of Birth: 1961/02/25  Transition of Care [facility]) CM/SW Contact  Nicanor Bake Phone Number: [phone] 10/14/2023, 12:17 PM  Clinical Narrative:   [facility] CSW met with nurse who assisted at pts bedside. Nurse explained that the pt needed a note for court. CSW contacted the pts wife who stated that she will be stepping in for the pt. Wife provided the CSW with the fax # that the court document needs to be sent to.   CSW faxed the court document to the pts attorney, Haynes Hoehn. At [phone]. Fax was successful.   TOC will continue following.     Expected Discharge Plan: Home w Home Health Services Barriers to Discharge: Continued Medical Work up  Expected Discharge Plan and Services   Discharge Planning Services: CM Consult Post Acute Care Choice: Home Health Living arrangements for the past 2 months: Single Family Home                           HH Arranged: RN, PT [facility] Agency: [facility] Date [facility] Agency Contacted: 10/10/23 Time HH Agency Contacted: [phone] Representative spoke with at [facility] Agency: Lorenza Chick   Social Determinants of Health (SDOH) Interventions SDOH Screenings   Food Insecurity: No Food Insecurity (10/09/2023)  Housing: Low Risk  (10/09/2023)  Transportation Needs: No Transportation Needs (10/09/2023)  Recent Concern: Transportation Needs - Unmet Transportation Needs (09/08/2023)  Utilities: Not At Risk (10/09/2023)  Alcohol Screen: Low Risk  (11/10/2020)  Depression (PHQ2-9): Low Risk  (10/16/2022)  Financial Resource Strain: Medium Risk (05/17/2022)  Physical Activity: Inactive (11/10/2020)  Tobacco Use: Medium Risk (10/08/2023)    Readmission Risk Interventions    07/01/2022    9:05 AM  Readmission Risk Prevention Plan  Transportation Screening Complete  Medication Review (RN Care Manager) Complete  PCP or Specialist  appointment within 3-5 days of discharge Complete  HRI or Home Care Consult Complete  SW Recovery Care/Counseling Consult Complete  Palliative Care Screening Not Applicable  Skilled Nursing Facility Not Applicable

## 2023-10-14 NOTE — Progress Notes (Addendum)
Advanced Heart Failure VAD Team Note  PCP-Cardiologist: Nona Dell, MD   Subjective:    Patient refused goals of care discussion with Palliative Care yesterday. PICC line removed. Awaiting blood cultures. BP 105/71 this am. MAP 84.  Sitting on side of bed. Feels well this morning. No SOB or CP.   LVAD Interrogation HM III: Speed: 5600 Flow: 4.4 PI: 4.6 Power: 4.2. Numerous PI events, baseline for patient.  Objective:    Vital Signs:   Temp:  [97.7 F (36.5 C)-98.3 F (36.8 C)] 97.7 F (36.5 C) (11/05 0729) Pulse Rate:  [85-95] 90 (11/05 0543) Resp:  [14-20] 18 (11/05 0729) BP: (85-126)/(71-99) 105/71 (11/05 0729) SpO2:  [93 %-100 %] 100 % (11/05 0543) Weight:  [83.7 kg] 83.7 kg (11/05 0543) Last BM Date : 10/10/23 Mean arterial Pressure 100-110  Intake/Output:   Intake/Output Summary (Last 24 hours) at 10/14/2023 0740 Last data filed at 10/14/2023 0549 Gross per 24 hour  Intake 580 ml  Output 1825 ml  Net -1245 ml     Physical Exam    Physical Exam: GENERAL: Well appearing. No resp distress HEENT: normal  NECK: Supple, JVP 6.  2+ bilaterally, no bruits.  No lymphadenopathy or thyromegaly appreciated.   CARDIAC:  Mechanical heart sounds with LVAD hum present.  LUNGS:  Clear to auscultation bilaterally.  ABDOMEN:  Soft, round, nontender, positive bowel sounds x4. Large hernia LVAD exit site: Dressing dry and intact. No erythema or drainage. Stabilization device present and accurately applied.   EXTREMITIES:  Warm and dry, no cyanosis, clubbing, rash or edema  NEUROLOGIC:  Alert and oriented x 4.  Gait steady.  No aphasia.  No dysarthria.  Affect pleasant.     Telemetry   NS 80s with occasional PVCs (personally reviewed)  Labs   Basic Metabolic Panel: Recent Labs  Lab 10/09/23 1227 10/10/23 1058 10/11/23 1233 10/12/23 0630 10/13/23 0520  NA 133* 132* 135 136 136  K 4.1 4.0 4.1 4.2 4.6  CL 98 101 104 100 103  CO2 21* 21* 22 25 25   GLUCOSE 159*  128* 181* 166* 159*  BUN 44* 39* 34* 34* 25*  CREATININE 2.09* 1.79* 1.67* 1.61* 1.50*  CALCIUM 8.4* 7.9* 8.4* 8.5* 8.5*  MG  --  2.0 1.8 2.2 1.9    Liver Function Tests: Recent Labs  Lab 10/08/23 2348  AST 33  ALT 31  ALKPHOS 135*  BILITOT 0.4  PROT 5.1*  ALBUMIN 1.9*   No results for input(s): "LIPASE", "AMYLASE" in the last 168 hours. No results for input(s): "AMMONIA" in the last 168 hours.  CBC: Recent Labs  Lab 10/08/23 2348 10/09/23 1227 10/10/23 1058 10/11/23 1233 10/12/23 0630 10/13/23 0520  WBC 15.4* 19.6* 11.7* 10.1 10.3 14.5*  NEUTROABS 12.9*  --   --   --   --   --   HGB 8.1* 9.0* 8.4* 9.0* 9.4* 9.9*  HCT 24.8* 27.9* 25.4* 28.2* 29.2* 30.9*  MCV 78.0* 78.2* 79.1* 78.1* 78.9* 77.8*  PLT 162 197 229 252 250 267    INR: Recent Labs  Lab 10/09/23 1227 10/10/23 1058 10/11/23 1233 10/12/23 0630 10/13/23 0520  INR 1.7* 1.9* 2.0* 2.2* 2.8*   Imaging   No results found.  Medications:     Scheduled Medications:  amLODipine  10 mg Oral Daily   Chlorhexidine Gluconate Cloth  6 each Topical Daily   hydrALAZINE  50 mg Oral Q8H   HYDROmorphone HCl  8 mg Oral Q24H   insulin aspart  0-15 Units Subcutaneous TID WC   insulin aspart  0-5 Units Subcutaneous QHS   metoprolol succinate  25 mg Oral Daily   minocycline  200 mg Oral BID   multivitamin with minerals  1 tablet Oral Daily   pantoprazole  40 mg Oral Daily   polyethylene glycol  17 g Oral Daily   rosuvastatin  20 mg Oral Daily   sertraline  50 mg Oral QHS   sodium chloride flush  10-40 mL Intracatheter Q12H   Warfarin - Pharmacist Dosing Inpatient   Does not apply q1600    Infusions:  meropenem (MERREM) IV 1 g (10/14/23 0549)    PRN Medications: acetaminophen, hydrALAZINE, HYDROmorphone, ipratropium-albuterol, ondansetron (ZOFRAN) IV, sodium chloride flush     Assessment/Plan:     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 Cultures have grown acinetobacter, MRSA and  pseudomonas Discharged from the hospital on 09/04/2023 with recurrent driveline infection growing Pseudomonas. On chronic minocycline and ciprofloxacin at home. Wound culture 10/1 grew MRSA. Readmitted 10/30 Bcx 2/2 Pseudomonas. CT ok procal 53 - Remains AF on IV abx - WBC up yesterday 10.3>14.5, CBC pending - PICC discontinued yesterday at request of ID, as it was placed prior to cleared blood culture.  - Cefepime and Dapto switched to Merem per ID. Also on minocycline for acinetobacter. - Unfortunately pseudomonas is Cipro resistant which leaves [place] without PO suppressive options - Will need single lumen PICC prior to discharge for home IV abx once BCX clear. Ideally would go to SNF given h/o drug use but has refused. - Repeat blood cultures pending - Palliative consulted for goals of care discussions   2. Abdominal pain with n/v:  CT 10/31 unremarkable Has struggled with ileus likely related to pain meds - On 8mg  daily exalgo, 2 mg PRN dilaudid Q6, +lidocaine patch - Continue home pain regimen. Adjust as needed. - Continue aggressive bowel regimen  3.  Chronic systolic CHF s/p Heartmate 3 LVAD on 11/17/20. Echo 10/11/23 EF 40-45% admit LVAD cannula in apex. - INR goal 2 - 2.5. Labs pending this am. Discussed warfarin dosing with PharmD personally. - Volume status looks good.  - Holding Spiro, Losartan with AKI - Continue Amlodipine.  - Hydral increase to 50. If Cr stable, restart losartan. - VAD interrogated personally. Parameters stable.  4. 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  AKI in setting of infection/ATN - improving - Hold GDMT  - SCr improving 0.8 > 2.22 > 1.79 > 1.61>1.5>pending   5.  CAD S/p CABG 12/20 - Continue Crestor 20 mg daily.  - no s/s angina   6.  Paroxysmal atrial fibrillation status post maze and left atrial appendage clip. NSR this admission. - Off amiodarone with hyperthyroidism.   7. Type 2  diabetes -SSI   8. Hx methamphetamine/polysubstance abuse: concern for ongoing drug-seeking behavior   9. Hx Hyperthyroidism:  Likely related to amiodarone. He is now off amiodarone and methimazole.    10. Anemia (chronic blood loss): stable - Trend CBC    11. COPD: Prior smoker. On trelegy at home   12. Ventral hernia: Large ventral hernia, soft and nontender.  Does not appear incarcerated.  No bowel obstruction on 09/23/23 abdominal CT.  Not candidate for surgery due to proximity of hernia to driveline tunnel. Intolerant abdominal binder.    Length of Stay: 5  [place] Lee, NP 10/14/2023, 7:40 AM  VAD Team --- VAD ISSUES ONLY--- Pager [phone] (7am - 7am)  Patient  seen with NP, agree with the above note.   MAP 80s-90s today, creatinine 1.35.  PICC pulled yesterday and cultures re-sent.    No complaints.   General: Well appearing this am. NAD.  HEENT: Normal. Neck: Supple, JVP 7-8 cm. Carotids OK.  Cardiac:  Mechanical heart sounds with LVAD hum present.  Lungs:  CTAB, normal effort.  Abdomen:  NT, ND, no HSM. Soft ventral hernia. +BS  LVAD exit site: Driveline site dressed.  Extremities:  Warm and dry. No cyanosis, clubbing, rash, or edema.  Neuro:  Alert & oriented x 3. Cranial nerves grossly intact. Moves all 4 extremities w/o difficulty. Affect pleasant    Patient has chronic driveline infection with Pseudomonas, now Cipro resistant.  No po antibiotic options.  He is on meropenem, he will have to be discharged with PICC and meropenem for home.  Refuses SNF.  Repeat blood cultures 11/4 and old PICC removed 11/4.  When 11/4 cultures have been negative x 72 hrs, can replace PICC for home.  He will remain on minocycline for acinetobacter.    Can add back losartan 12.5 daily with elevated MAP.    INR 2.5, goal 2-2.5.    Marca Ancona 10/14/2023 11:40 AM

## 2023-10-14 NOTE — Plan of Care (Signed)
  Problem: Cardiac: Goal: LVAD will function as expected and patient will experience no clinical alarms Outcome: Progressing   Problem: Activity: Goal: Risk for activity intolerance will decrease Outcome: Progressing   Problem: Nutrition: Goal: Adequate nutrition will be maintained Outcome: Progressing   Problem: Coping: Goal: Level of anxiety will decrease Outcome: Progressing   Problem: Elimination: Goal: Will not experience complications related to bowel motility Outcome: Progressing Goal: Will not experience complications related to urinary retention Outcome: Progressing   Problem: Pain Management: Goal: General experience of comfort will improve Outcome: Progressing

## 2023-10-14 NOTE — Progress Notes (Addendum)
LVAD Coordinator Rounding Note:  Admitted 10/09/23 to Heart Failure service from [facility] with worsening back pain and pt unable to eat or drink for 3-4 days.   CT abd/pelvis 10/09/23 1. No evidence of acute traumatic injury in the chest, abdomen, or pelvis. 2. LVAD is in unchanged in configuration compared with 09/08/2023. Similar thickening about the drive line. 3. Emphysema and chronic bronchitis. 4. Hepatic steatosis. 5. Nonobstructing left nephrolithiasis. 6. Large broad-based anterior abdominal wall hernia containing nonobstructed transverse colon and small bowel.   Pt laying in bed on my arrival. States he is feeling ok this morning.   CT Spine/Thoracic Spine 11/3 without infection.   BC + for pseudomonas 10/31. Pt would benefit from home IV antibiotics. He has been drug free and complaint in the past few months. Pt agrees to continue to be compliant with treatment to abstain from drug use. Per ID team note 11/3- Cefepime/Daptomycin discontinued due to antibiotic resistance per culture results. Started on Meropenem 1 g every 8 hours. Repeat blood cultures drawn 11/4.  Palliative care consult has been placed by provider team. His weight is down almost 20 lbs from his discharge weight on 10/24.  Vital signs: Temp: 97.7 HR: 91 Doppler Pressure: 92 Automatic BP: 113/81 (90) O2 Sat: 100% on RA Wt: 178>185.5>182.5>184.5 lbs  LVAD interrogation reveals:  Speed: 5600 Flow: 4.1 Power: 4.1 w PI: 4.2  Alarms: none Events: 100+ PI events today Hct: 32  Fixed speed:  5600 Low speed limit:  5300  Drive Line: Existing VAD dressing removed and site care performed using sterile technique. Wound bed cleansed with VASHE solution. Skin surrounding wound bed cleaned with Chlora prep applicators x 2, allowed to dry. Followed by VASHE soaked 2x2 flush to exit site and then covered with several dry 4 x 4s. Drive line partially incorporated. Driveline tunnels approx 5 cm. Small amount of thick  yellow/serosanguinous drainage noted. No redness, tenderness, foul odor or rash noted. Ulcerated area distal to drive line cleansed with VASHE solution. VASHE moistened 2 x 2 placed over area and covered with gauze. Entire area covered with 2 large tegaderms. Drive line anchor reapplied. Continue daily wet to dry dressing changes using VASHE solution. Next dressing change due 10/15/23.     Pt had previous drive line debridement in OR with placement of instillation wound vac on 07/09/23 with Dr Donata Clay. This was changed to wet/dry on Friday 8/2. No further debridement of the VAD tunnel would be possible without injury to the outflow graft superiorly or the peritoneal cavity inferior to the power cord per Dr Donata Clay.   Labs:  LDH trend: 141>158>158  INR trend: 1.6>1.7>2.8>2.5  WBC trend: 15.4>19.6>14.5>12.5  Anticoagulation Plan: -INR Goal: 2.0 - 2.5 -ASA Dose: none  ICD: N/A  Infection:  10/09/23>> blood cxs>>  PSEUDOMONAS AERUGINOSA  10/13/23>> blood cxs>> PSEUDOMONAS AERUGINOSA  Drips:   Plan/Recommendations:  Page VAD coordinator with equipment issues or driveline problems Daily drive line wet to dry dressing changes with VASHE solution per bedside RN.   Alyce Pagan RN VAD Coordinator  Office: [phone]  24/7 Pager: [phone]

## 2023-10-15 ENCOUNTER — Ambulatory Visit: Payer: 59 | Admitting: Internal Medicine

## 2023-10-15 DIAGNOSIS — T827XXA Infection and inflammatory reaction due to other cardiac and vascular devices, implants and grafts, initial encounter: Secondary | ICD-10-CM | POA: Diagnosis not present

## 2023-10-15 LAB — CBC
HCT: 31 % — ABNORMAL LOW (ref 39.0–52.0)
Hemoglobin: 9.8 g/dL — ABNORMAL LOW (ref 13.0–17.0)
MCH: 25 pg — ABNORMAL LOW (ref 26.0–34.0)
MCHC: 31.6 g/dL (ref 30.0–36.0)
MCV: 79.1 fL — ABNORMAL LOW (ref 80.0–100.0)
Platelets: 244 10*3/uL (ref 150–400)
RBC: 3.92 MIL/uL — ABNORMAL LOW (ref 4.22–5.81)
RDW: 15.6 % — ABNORMAL HIGH (ref 11.5–15.5)
WBC: 12 10*3/uL — ABNORMAL HIGH (ref 4.0–10.5)
nRBC: 0 % (ref 0.0–0.2)

## 2023-10-15 LAB — BASIC METABOLIC PANEL
Anion gap: 11 (ref 5–15)
BUN: 20 mg/dL (ref 8–23)
CO2: 22 mmol/L (ref 22–32)
Calcium: 8.4 mg/dL — ABNORMAL LOW (ref 8.9–10.3)
Chloride: 102 mmol/L (ref 98–111)
Creatinine, Ser: 1.35 mg/dL — ABNORMAL HIGH (ref 0.61–1.24)
GFR, Estimated: 59 mL/min — ABNORMAL LOW (ref >60)
Glucose, Bld: 251 mg/dL — ABNORMAL HIGH (ref 70–99)
Potassium: 4.7 mmol/L (ref 3.5–5.1)
Sodium: 135 mmol/L (ref 135–145)

## 2023-10-15 LAB — GLUCOSE, CAPILLARY
Glucose-Capillary: 189 mg/dL — ABNORMAL HIGH (ref 70–99)
Glucose-Capillary: 248 mg/dL — ABNORMAL HIGH (ref 70–99)
Glucose-Capillary: 269 mg/dL — ABNORMAL HIGH (ref 70–99)
Glucose-Capillary: 120 mg/dL — ABNORMAL HIGH (ref 70–99)

## 2023-10-15 LAB — LACTATE DEHYDROGENASE: LDH: 164 U/L (ref 98–192)

## 2023-10-15 LAB — PROTIME-INR
INR: 2.2 — ABNORMAL HIGH (ref 0.8–1.2)
Prothrombin Time: 24.5 s — ABNORMAL HIGH (ref 11.4–15.2)

## 2023-10-15 LAB — MAGNESIUM: Magnesium: 2 mg/dL (ref 1.7–2.4)

## 2023-10-15 MED ORDER — WARFARIN SODIUM 3 MG PO TABS
6.0000 mg | ORAL_TABLET | Freq: Once | ORAL | Status: AC
  Administered 2023-10-15: 6 mg via ORAL
  Filled 2023-10-15: qty 2

## 2023-10-15 MED ORDER — SENNA 8.6 MG PO TABS
1.0000 | ORAL_TABLET | Freq: Two times a day (BID) | ORAL | Status: DC
  Administered 2023-10-15 – 2023-10-17 (×5): 8.6 mg via ORAL
  Filled 2023-10-15 (×5): qty 1

## 2023-10-15 NOTE — Plan of Care (Signed)
  Problem: Education: Goal: Patient will understand all VAD equipment and how it functions Outcome: Progressing Goal: Patient will be able to verbalize current INR target range and antiplatelet therapy for discharge home Outcome: Progressing   Problem: Cardiac: Goal: LVAD will function as expected and patient will experience no clinical alarms Outcome: Progressing   Problem: Education: Goal: Knowledge of General Education information will improve Description: Including pain rating scale, medication(s)/side effects and non-pharmacologic comfort measures Outcome: Progressing   Problem: Health Behavior/Discharge Planning: Goal: Ability to manage health-related needs will improve Outcome: Progressing   Problem: Clinical Measurements: Goal: Ability to maintain clinical measurements within normal limits will improve Outcome: Progressing Goal: Will remain free from infection Outcome: Progressing Goal: Diagnostic test results will improve Outcome: Progressing Goal: Respiratory complications will improve Outcome: Progressing Goal: Cardiovascular complication will be avoided Outcome: Progressing   Problem: Activity: Goal: Risk for activity intolerance will decrease Outcome: Progressing   Problem: Nutrition: Goal: Adequate nutrition will be maintained Outcome: Progressing   Problem: Coping: Goal: Level of anxiety will decrease Outcome: Progressing   Problem: Elimination: Goal: Will not experience complications related to bowel motility Outcome: Progressing Goal: Will not experience complications related to urinary retention Outcome: Progressing   Problem: Pain Management: Goal: General experience of comfort will improve Outcome: Progressing   Problem: Safety: Goal: Ability to remain free from injury will improve Outcome: Progressing   Problem: Skin Integrity: Goal: Risk for impaired skin integrity will decrease Outcome: Progressing   Problem: Education: Goal:  Ability to describe self-care measures that may prevent or decrease complications (Diabetes Survival Skills Education) will improve Outcome: Progressing Goal: Individualized Educational Video(s) Outcome: Progressing   Problem: Coping: Goal: Ability to adjust to condition or change in health will improve Outcome: Progressing   Problem: Fluid Volume: Goal: Ability to maintain a balanced intake and output will improve Outcome: Progressing   Problem: Health Behavior/Discharge Planning: Goal: Ability to identify and utilize available resources and services will improve Outcome: Progressing Goal: Ability to manage health-related needs will improve Outcome: Progressing   Problem: Metabolic: Goal: Ability to maintain appropriate glucose levels will improve Outcome: Progressing   Problem: Nutritional: Goal: Maintenance of adequate nutrition will improve Outcome: Progressing Goal: Progress toward achieving an optimal weight will improve Outcome: Progressing   Problem: Skin Integrity: Goal: Risk for impaired skin integrity will decrease Outcome: Progressing   Problem: Tissue Perfusion: Goal: Adequacy of tissue perfusion will improve Outcome: Progressing

## 2023-10-15 NOTE — Progress Notes (Signed)
PHARMACY - ANTICOAGULATION CONSULT NOTE  Pharmacy Consult for warfarin Indication:  LVAD HM3  Allergies  Allergen Reactions   Firvanq [Vancomycin] Nausea And Vomiting and Other (See Comments)    Arthralgias     Patient Measurements: Height: 5\' 10"  (177.8 cm) Weight: 84.1 kg (185 lb 6.5 oz) IBW/kg (Calculated) : 73 Heparin Dosing Weight: 77.1 kg   Vital Signs: Temp: 98.2 F (36.8 C) (11/06 0753) Temp Source: Oral (11/06 0753) BP: 82/72 (11/06 0624) Pulse Rate: 101 (11/06 0915)  Labs: Recent Labs    10/13/23 0520 10/14/23 0801 10/15/23 0946  HGB 9.9* 10.0* 9.8*  HCT 30.9* 31.6* 31.0*  PLT 267 258 244  LABPROT 29.3* 27.1* 24.5*  INR 2.8* 2.5* 2.2*  CREATININE 1.50* 1.35* 1.35*    Estimated Creatinine Clearance: 58.6 mL/min (A) (by C-G formula based on SCr of 1.35 mg/dL (H)).   Medical History: Past Medical History:  Diagnosis Date   "    Arthritis    CAD (coronary artery disease)    a. s/p CABG in 11/2019 with LIMA-LAD, SVG-OM1, SVG-PDA and SVG-D1   CHF (congestive heart failure) (HCC)    a. EF < 20% by echo in 11/2019   Essential hypertension    PAF (paroxysmal atrial fibrillation) (HCC)    Type 2 diabetes mellitus (HCC)     Medications:  Scheduled:   amLODipine  10 mg Oral Daily   Chlorhexidine Gluconate Cloth  6 each Topical Daily   hydrALAZINE  50 mg Oral Q8H   HYDROmorphone HCl  8 mg Oral Q24H   insulin aspart  0-15 Units Subcutaneous TID WC   insulin aspart  0-5 Units Subcutaneous QHS   insulin glargine-yfgn  10 Units Subcutaneous Daily   losartan  12.5 mg Oral Daily   metoprolol succinate  25 mg Oral Daily   minocycline  200 mg Oral BID   multivitamin with minerals  1 tablet Oral Daily   pantoprazole  40 mg Oral Daily   polyethylene glycol  17 g Oral Daily   rosuvastatin  20 mg Oral Daily   sertraline  50 mg Oral QHS   sodium chloride flush  10-40 mL Intracatheter Q12H   Warfarin - Pharmacist Dosing Inpatient   Does not apply q1600    Assessment: 62 yom presenting with SOB and cough - on warfarin PTA for hx LVAD HM3 (LD 10/30).  PTA regimen is 4 mg daily except 6 mg Wed.   INR is therapeutic at 2.2. Hgb 9.8, plt 244. LDH 64. No s/sx of bleeding.   Goal of Therapy:  INR 2-2.5 Monitor platelets by anticoagulation protocol: Yes   Plan:  Order warfarin at 6 mg tonight to match PTA regimen Monitor daily INR, CBC, and for s/sx of bleeding   Thank you for allowing pharmacy to participate in this patient's care,  Sherron Monday, PharmD, BCCCP Clinical Pharmacist  Phone: [phone] 10/15/2023 11:17 AM  Please check AMION for all [facility] Pharmacy phone numbers After 10:00 PM, call Main Pharmacy [phone]

## 2023-10-15 NOTE — TOC Progression Note (Signed)
Transition of Care (TOC) - Progression Note    Patient Details  Name: Kevin Underwood. MRN: [id number] Date of Birth: Sep 25, 1961  Transition of Care [facility]) CM/SW Contact  Nicanor Bake Phone Number: [phone] 10/15/2023, 11:38 AM  Clinical Narrative:   HF CSW met with pts wife to giver her a hard copy of the pts courthouse letter.  TOC will continue following.     Expected Discharge Plan: Home w Home Health Services Barriers to Discharge: Continued Medical Work up  Expected Discharge Plan and Services   Discharge Planning Services: CM Consult Post Acute Care Choice: Home Health Living arrangements for the past 2 months: Single Family Home                           HH Arranged: RN, PT [facility] Agency: [facility] Date [facility] Agency Contacted: 10/10/23 Time HH Agency Contacted: [phone] Representative spoke with at [facility] Agency: Lorenza Chick   Social Determinants of Health (SDOH) Interventions SDOH Screenings   Food Insecurity: No Food Insecurity (10/09/2023)  Housing: Low Risk  (10/09/2023)  Transportation Needs: No Transportation Needs (10/09/2023)  Recent Concern: Transportation Needs - Unmet Transportation Needs (09/08/2023)  Utilities: Not At Risk (10/09/2023)  Alcohol Screen: Low Risk  (11/10/2020)  Depression (PHQ2-9): Low Risk  (10/16/2022)  Financial Resource Strain: Medium Risk (05/17/2022)  Physical Activity: Inactive (11/10/2020)  Tobacco Use: Medium Risk (10/08/2023)    Readmission Risk Interventions    07/01/2022    9:05 AM  Readmission Risk Prevention Plan  Transportation Screening Complete  Medication Review (RN Care Manager) Complete  PCP or Specialist appointment within 3-5 days of discharge Complete  HRI or Home Care Consult Complete  SW Recovery Care/Counseling Consult Complete  Palliative Care Screening Not Applicable  Skilled Nursing Facility Not Applicable

## 2023-10-15 NOTE — Progress Notes (Signed)
LVAD Coordinator Rounding Note:  Admitted 10/09/23 to Heart Failure service from [facility] with worsening back pain and pt unable to eat or drink for 3-4 days.   CT abd/pelvis 10/09/23 1. No evidence of acute traumatic injury in the chest, abdomen, or pelvis. 2. LVAD is in unchanged in configuration compared with 09/08/2023. Similar thickening about the drive line. 3. Emphysema and chronic bronchitis. 4. Hepatic steatosis. 5. Nonobstructing left nephrolithiasis. 6. Large broad-based anterior abdominal wall hernia containing nonobstructed transverse colon and small bowel.   Pt laying in bed on my arrival. States he is feeling ok this morning. Denies complaints.  CT Spine/Thoracic Spine 11/3 without infection.   BC + for pseudomonas 10/31 & 11/4. Pt would benefit from home IV antibiotics. He has been drug free and complaint in the past few months. Pt agrees to continue to be compliant with treatment to abstain from drug use. Per ID team note 11/3- Cefepime/Daptomycin discontinued due to antibiotic resistance per culture results. Started on Meropenem 1 g every 8 hours.   Palliative care consult has been placed by provider team. Pt refused further palliative/ GOC discussion yesterday. Palliative has signed off.   Vital signs: Temp: 98.2 HR: 90 Doppler Pressure: 96 Automatic BP: 104/81 (89) O2 Sat: 100% on RA Wt: 178>185.5>182.5>184.5>185.4 lbs  LVAD interrogation reveals:  Speed: 5600 Flow: 4.3 Power: 4.0 w PI: 5.0  Alarms: none Events: 100+ PI events today Hct: 32  Fixed speed:  5600 Low speed limit:  5300  Drive Line: Existing VAD dressing removed and site care performed using sterile technique. Wound bed cleansed with VASHE solution. Skin surrounding wound bed cleaned with Chlora prep applicators x 2, allowed to dry. Followed by VASHE soaked 2x2 flush to exit site and then covered with several dry 4 x 4s. Drive line partially incorporated. Driveline tunnels approx 5 cm. Small  amount of thick yellow/serosanguinous drainage noted. No redness, tenderness, foul odor or rash noted. Ulcerated area distal to drive line cleansed with VASHE solution. VASHE moistened 2 x 2 placed over area and covered with gauze. Entire area covered with 2 large tegaderms. Drive line anchor reapplied. Continue daily wet to dry dressing changes using VASHE solution. Next dressing change due 10/16/23.    Pt had previous drive line debridement in OR with placement of instillation wound vac on 07/09/23 with Dr Donata Clay. This was changed to wet/dry on Friday 8/2. No further debridement of the VAD tunnel would be possible without injury to the outflow graft superiorly or the peritoneal cavity inferior to the power cord per Dr Donata Clay.   Labs:  LDH trend: 141>158>158>158  INR trend: 1.6>1.7>2.8>2.5>2.5  WBC trend: 15.4>19.6>14.5>12.5>12.5  Anticoagulation Plan: -INR Goal: 2.0 - 2.5 -ASA Dose: none  ICD: N/A  Infection:  10/09/23>> blood cxs>>  PSEUDOMONAS AERUGINOSA  10/13/23>> blood cxs>> PSEUDOMONAS AERUGINOSA  Drips:   Plan/Recommendations:  Page VAD coordinator with equipment issues or driveline problems Daily drive line wet to dry dressing changes with VASHE solution per bedside RN.   Alyce Pagan RN VAD Coordinator  Office: [phone]  24/7 Pager: [phone]

## 2023-10-15 NOTE — Plan of Care (Signed)
  Problem: Activity: Goal: Risk for activity intolerance will decrease Outcome: Progressing   Problem: Nutrition: Goal: Adequate nutrition will be maintained Outcome: Progressing   Problem: Coping: Goal: Level of anxiety will decrease Outcome: Progressing   Problem: Elimination: Goal: Will not experience complications related to bowel motility Outcome: Progressing Goal: Will not experience complications related to urinary retention Outcome: Progressing   

## 2023-10-15 NOTE — Progress Notes (Addendum)
Advanced Heart Failure VAD Team Note  PCP-Cardiologist: Nona Dell, MD   Subjective:    Patient has refused GOC discussion with Palliative Care. Signed Off Awaiting blood culture results for PICC placement for home IV abx.  LVAD Interrogation HM III: Speed: 5600 Flow: 4.4 PI: 4.6 Power: 4.2. Numerous PI events, baseline for patient.  Objective:    Vital Signs:   Temp:  [97.7 F (36.5 C)-98.4 F (36.9 C)] 98.2 F (36.8 C) (11/06 0753) Pulse Rate:  [88-101] 101 (11/06 0915) Resp:  [14-20] 14 (11/06 0624) BP: (82-113)/(65-86) 82/72 (11/06 0624) SpO2:  [97 %-100 %] 100 % (11/06 0624) Weight:  [84.1 kg] 84.1 kg (11/06 0624) Last BM Date : 10/10/23 Mean arterial Pressure 100-110  Intake/Output:   Intake/Output Summary (Last 24 hours) at 10/15/2023 0935 Last data filed at 10/15/2023 0620 Gross per 24 hour  Intake 660 ml  Output 1425 ml  Net -765 ml     Physical Exam    General: Well appearing. No distress on RA HEENT: neck supple.   Cardiac: JVP not visible. Mechanical heart sounds with LVAD hum present.  Resp: Lung sounds clear and equal B/L Abdomen: Soft, non-tender, non-distended. + BS. + hernia Driveline: Dressing C/D/I. No drainage or redness. Anchor in place. Extremities: Warm and dry. No rash, cyanosis, or edema Neuro: Alert and oriented x3. Affect pleasant. Moves all extremities without difficulty.  VAD interrogated personally. Parameters stable.  Telemetry   NS in 80s (personally reviewed)  Labs   Basic Metabolic Panel: Recent Labs  Lab 10/10/23 1058 10/11/23 1233 10/12/23 0630 10/13/23 0520 10/14/23 0801  NA 132* 135 136 136 133*  K 4.0 4.1 4.2 4.6 4.5  CL 101 104 100 103 99  CO2 21* 22 25 25 22   GLUCOSE 128* 181* 166* 159* 262*  BUN 39* 34* 34* 25* 27*  CREATININE 1.79* 1.67* 1.61* 1.50* 1.35*  CALCIUM 7.9* 8.4* 8.5* 8.5* 8.3*  MG 2.0 1.8 2.2 1.9 1.6*    Liver Function Tests: Recent Labs  Lab 10/08/23 2348  AST 33  ALT 31   ALKPHOS 135*  BILITOT 0.4  PROT 5.1*  ALBUMIN 1.9*   No results for input(s): "LIPASE", "AMYLASE" in the last 168 hours. No results for input(s): "AMMONIA" in the last 168 hours.  CBC: Recent Labs  Lab 10/08/23 2348 10/09/23 1227 10/10/23 1058 10/11/23 1233 10/12/23 0630 10/13/23 0520 10/14/23 0801  WBC 15.4*   < > 11.7* 10.1 10.3 14.5* 12.5*  NEUTROABS 12.9*  --   --   --   --   --   --   HGB 8.1*   < > 8.4* 9.0* 9.4* 9.9* 10.0*  HCT 24.8*   < > 25.4* 28.2* 29.2* 30.9* 31.6*  MCV 78.0*   < > 79.1* 78.1* 78.9* 77.8* 78.8*  PLT 162   < > 229 252 250 267 258   < > = values in this interval not displayed.   INR: Recent Labs  Lab 10/10/23 1058 10/11/23 1233 10/12/23 0630 10/13/23 0520 10/14/23 0801  INR 1.9* 2.0* 2.2* 2.8* 2.5*   Imaging   No results found.  Medications:    Scheduled Medications:  amLODipine  10 mg Oral Daily   Chlorhexidine Gluconate Cloth  6 each Topical Daily   hydrALAZINE  50 mg Oral Q8H   HYDROmorphone HCl  8 mg Oral Q24H   insulin aspart  0-15 Units Subcutaneous TID WC   insulin aspart  0-5 Units Subcutaneous QHS   insulin glargine-yfgn  10 Units Subcutaneous Daily   losartan  12.5 mg Oral Daily   metoprolol succinate  25 mg Oral Daily   minocycline  200 mg Oral BID   multivitamin with minerals  1 tablet Oral Daily   pantoprazole  40 mg Oral Daily   polyethylene glycol  17 g Oral Daily   rosuvastatin  20 mg Oral Daily   sertraline  50 mg Oral QHS   sodium chloride flush  10-40 mL Intracatheter Q12H   Warfarin - Pharmacist Dosing Inpatient   Does not apply q1600    Infusions:  meropenem (MERREM) IV 1 g (10/15/23 0600)    PRN Medications: acetaminophen, fluticasone, hydrALAZINE, HYDROmorphone, ipratropium-albuterol, ondansetron (ZOFRAN) IV, sodium chloride flush  Assessment/Plan:    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 Cultures have grown acinetobacter, MRSA and pseudomonas Discharged from the hospital on  09/04/2023 with recurrent driveline infection growing Pseudomonas. On chronic minocycline and ciprofloxacin at home. Wound culture 10/1 grew MRSA. Readmitted 10/30 Bcx 2/2 Pseudomonas. CT ok procal 53 - Remains AF on IV abx - WBC up yesterday 10.3>14.5, CBC pending - PICC discontinued yesterday at request of ID, as it was placed prior to cleared blood culture.  - Cefepime and Dapto switched to Merem per ID. On minocycline for acinetobacter. - Unfortunately pseudomonas is Cipro resistant which leaves [place] without PO suppressive options - Will need single lumen PICC prior to discharge for home IV abx once BCX clear 72hr. Ideally would go to SNF given h/o drug use but has refused. - Repeat blood cultures - NGTD - Refused GOC discussion with Palliative Care, has now signed off.   2. Abdominal pain with n/v:  CT 10/31 unremarkable Has struggled with ileus likely related to pain meds - On 8mg  daily exalgo, 2 mg PRN dilaudid Q6, +lidocaine patch - Continue home pain regimen. Adjust as needed. - Continue aggressive bowel regimen  3.  Chronic systolic CHF s/p Heartmate 3 LVAD on 11/17/20. Echo 10/11/23 EF 40-45% admit LVAD cannula in apex. - INR goal 2 - 2.5. Labs pending this am. Discussed warfarin dosing with PharmD personally. - Volume status looks good.  - Holding Spiro with AKI - Continue Losartan 12.5, resumed yesterday - Continue Amlodipine - Continue Hydral 50 TID  4. 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  AKI in setting of infection/ATN - SCr improving. Peaked at 2.22. 1.35 yesterday. BMP pending.   5.  CAD S/p CABG 12/20 - Continue Crestor 20 mg daily.  - no s/s angina   6.  Paroxysmal atrial fibrillation status post maze and left atrial appendage clip. NSR this admission. - Off amiodarone with hyperthyroidism.   7. Type 2 diabetes -SSI   8. Hx methamphetamine/polysubstance abuse: concern for ongoing drug-seeking behavior   9.  Hx Hyperthyroidism:  Likely related to amiodarone. He is now off amiodarone and methimazole.    10. Anemia (chronic blood loss): stable - Trend CBC    11. COPD: Prior smoker. On trelegy at home   12. Ventral hernia: Large ventral hernia, soft and nontender.  Does not appear incarcerated.  No bowel obstruction on 09/23/23 abdominal CT.  Not candidate for surgery due to proximity of hernia to driveline tunnel. Intolerant abdominal binder.    Length of Stay: 6  Kevin Lee, Kevin Underwood 10/15/2023, 9:35 AM  Agree with the above Kevin Underwood note.   MAP stable today, 70s-90s range.  No complaints today.    LVAD parameters stable.   General: Well  appearing this am. NAD.  HEENT: Normal. Neck: Supple, JVP 7-8 cm. Carotids OK.  Cardiac:  Mechanical heart sounds with LVAD hum present.  Lungs:  CTAB, normal effort.  Abdomen:  NT, ND, no HSM. No bruits or masses. +BS  LVAD exit site: Well-healed and incorporated. Dressing dry and intact. No erythema or drainage. Stabilization device present and accurately applied. Driveline dressing changed daily per sterile technique. Extremities:  Warm and dry. No cyanosis, clubbing, rash, or edema.  Neuro:  Alert & oriented x 3. Cranial nerves grossly intact. Moves all 4 extremities w/o difficulty. Affect pleasant    Patient has chronic driveline infection with Pseudomonas, now Cipro resistant.  No po antibiotic options.  He is on meropenem, he will have to be discharged with PICC and meropenem for home.  Refuses SNF.  Repeat blood cultures 11/4 and old PICC removed 11/4.  When 11/4 cultures have been negative x 72 hrs, can replace PICC for home.  He will remain on minocycline for acinetobacter.   Kevin Underwood 10/15/2023

## 2023-10-16 ENCOUNTER — Other Ambulatory Visit (HOSPITAL_COMMUNITY): Payer: Self-pay

## 2023-10-16 ENCOUNTER — Encounter (HOSPITAL_COMMUNITY): Payer: 59 | Admitting: Cardiology

## 2023-10-16 ENCOUNTER — Other Ambulatory Visit: Payer: Self-pay

## 2023-10-16 DIAGNOSIS — T827XXA Infection and inflammatory reaction due to other cardiac and vascular devices, implants and grafts, initial encounter: Secondary | ICD-10-CM | POA: Diagnosis not present

## 2023-10-16 LAB — CBC
HCT: 29.9 % — ABNORMAL LOW (ref 39.0–52.0)
Hemoglobin: 9.5 g/dL — ABNORMAL LOW (ref 13.0–17.0)
MCH: 24.9 pg — ABNORMAL LOW (ref 26.0–34.0)
MCHC: 31.8 g/dL (ref 30.0–36.0)
MCV: 78.5 fL — ABNORMAL LOW (ref 80.0–100.0)
Platelets: 198 10*3/uL (ref 150–400)
RBC: 3.81 MIL/uL — ABNORMAL LOW (ref 4.22–5.81)
RDW: 15.9 % — ABNORMAL HIGH (ref 11.5–15.5)
WBC: 12.4 10*3/uL — ABNORMAL HIGH (ref 4.0–10.5)
nRBC: 0 % (ref 0.0–0.2)

## 2023-10-16 LAB — GLUCOSE, CAPILLARY
Glucose-Capillary: 164 mg/dL — ABNORMAL HIGH (ref 70–99)
Glucose-Capillary: 170 mg/dL — ABNORMAL HIGH (ref 70–99)
Glucose-Capillary: 193 mg/dL — ABNORMAL HIGH (ref 70–99)

## 2023-10-16 LAB — BASIC METABOLIC PANEL
Anion gap: 9 (ref 5–15)
BUN: 21 mg/dL (ref 8–23)
CO2: 20 mmol/L — ABNORMAL LOW (ref 22–32)
Calcium: 8 mg/dL — ABNORMAL LOW (ref 8.9–10.3)
Chloride: 105 mmol/L (ref 98–111)
Creatinine, Ser: 1.46 mg/dL — ABNORMAL HIGH (ref 0.61–1.24)
GFR, Estimated: 54 mL/min — ABNORMAL LOW (ref >60)
Glucose, Bld: 243 mg/dL — ABNORMAL HIGH (ref 70–99)
Potassium: 4.3 mmol/L (ref 3.5–5.1)
Sodium: 134 mmol/L — ABNORMAL LOW (ref 135–145)

## 2023-10-16 LAB — PROTIME-INR
INR: 2.5 — ABNORMAL HIGH (ref 0.8–1.2)
Prothrombin Time: 27.5 s — ABNORMAL HIGH (ref 11.4–15.2)

## 2023-10-16 LAB — MAGNESIUM: Magnesium: 1.8 mg/dL (ref 1.7–2.4)

## 2023-10-16 LAB — LACTATE DEHYDROGENASE: LDH: 153 U/L (ref 98–192)

## 2023-10-16 MED ORDER — WARFARIN SODIUM 4 MG PO TABS
4.0000 mg | ORAL_TABLET | Freq: Once | ORAL | Status: AC
  Administered 2023-10-16: 4 mg via ORAL
  Filled 2023-10-16: qty 1

## 2023-10-16 MED ORDER — HYDRALAZINE HCL 50 MG PO TABS
50.0000 mg | ORAL_TABLET | Freq: Three times a day (TID) | ORAL | 5 refills | Status: DC
  Filled 2023-10-16: qty 90, 30d supply, fill #0

## 2023-10-16 MED ORDER — HYDROMORPHONE HCL 2 MG PO TABS
2.0000 mg | ORAL_TABLET | Freq: Four times a day (QID) | ORAL | Status: DC | PRN
  Administered 2023-10-16 – 2023-10-17 (×6): 2 mg via ORAL
  Filled 2023-10-16 (×6): qty 1

## 2023-10-16 MED ORDER — LOSARTAN POTASSIUM 25 MG PO TABS
12.5000 mg | ORAL_TABLET | Freq: Every day | ORAL | 5 refills | Status: DC
  Filled 2023-10-16: qty 30, 60d supply, fill #0

## 2023-10-16 NOTE — Progress Notes (Signed)
Patient c/o 8/10 pain to back and neck.  States pain medication works but wears off prior to next dose.  Patient tearful and appears to be very uncomfortable.  Repositioned as allowed and emotional support given.  Cardiology paged and requested something for pain at this time.

## 2023-10-16 NOTE — Discharge Summary (Addendum)
Advanced Heart Failure Team  Discharge Summary   Patient ID: Kevin Underwood. MRN: [id number], DOB/AGE: 05-15-1961 62 y.o. Admit date: 10/08/2023 D/C date:     10/17/2023   Primary Discharge Diagnoses:  Recurrent driveline infection Abdominal pain with n/v  Secondary Discharge Diagnoses:  Chronic systolic CHF s/p heartmate 3 LVAD AKI CAD s/p CABG PAF DMII Hx methamphetamine/polysubstance abuse Hx hyperthyroidism Anemia COPD Ventral hernia  Hospital Course:  Kevin Stickrod. is a 62 y.o. 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 type 2 diabetes mellitus, COPD, and hypertension. 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   Has history of chronic driveline infections, most recently in July 2024. At that time, wound cultures grew MSSA and Pseudomonas. Completed long course of IV abx. Now on chronic suppressive PO antibiotics.    Admitted 09/08/23 for possible early sepsis. Was placed on IV antibiotics. CT abd/pelvis unchanged from previous. Blood cultures negative x2 days. CT surgery consulted. Driveline wound was improving. Pan to continue conservative wound care. Viral source felt to be the culprit. WBC trended down with improvement.    Most recently admitted from VAD clinic 10/15 for severe abdominal pain at abdominal hernia and N/V with elevated lactic acid and WBC concerning for infection. CT A/P with no evidence of ischemic bowel, obstruction or worsening driveline infection. He was seen by TCTS and ID. Was treated with IV abx and discharge with minocycline and cipro. Stay was prolonged by uncontrolled that was improved at discharge with pain clinic follow up.   Presented to ED 10/08/23 for shortness of breath and productive cough with pleuritic chest pain. Labs on admission notable for WBC 15.4,  hgb 8.1, Na 130, K 4.1, Cr 2.22, LDH 114, procal 53.9, blood cultures were obtained. CXR with unchanged cardiomegaly. CT Abd negative for acute findings. Started on IV cefepime and dapto and admitted for observation.  BCx grew Pseudomonas, unfortunately Cipro resistant thus no PO abx suppressive options. ID consulted. IV Abx switched to meropenum and PICC was removed 11/4. Minocycline continued for acinetobacter. Repeat BCx from 11/4 remained negative x 3 days. ID cleared for patient to get placement of new single lumen PICC for continuation of home meropenum. Ideally would go to SNF given h/o drug use but has refused. Also refused GOC discussion w/ palliative care.   He remained stable from HF/VAD perspective.   On 11/8, pt was last seen and examined by Dr. Elwyn Lade and felt stable for d/c home. Home Health services and home meropenum were arranged. Kevin Underwood to see today for final teachings with wife.   Pt will f/u in VAD clinic.   Pt will continue to be followed closely in the VAD/HF clinic. Dr Elwyn Lade evaluated and deemed appropriate for discharge.   See below for detailed problem list: 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 On chronic minocycline and ciprofloxacin at home. Wound culture 10/1 grew MRSA. Readmitted 10/30 Bcx 2/2 Pseudomonas. CT ok procal 53 - Remains AF on IV abx - PICC discontinued 11/4 at request of ID, as it was placed prior to cleared blood culture.  - Cefepime and Dapto switched to Merem per ID. On minocycline for acinetobacter. - Unfortunately pseudomonas is Cipro resistant which leaves [place] without PO  suppressive options - Now with single lumen PICC - Refused GOC discussion with Palliative Care, has now signed off. 2. Abdominal pain with n/v:  CT 10/31 unremarkable Has struggled with ileus likely  related to pain meds - On 8mg  daily exalgo, 2 mg PRN dilaudid Q6, +lidocaine patch - Continue home pain regimen.  - Continue aggressive bowel regimen 3.  Chronic systolic CHF s/p Heartmate 3 LVAD on 11/17/20. Echo 10/11/23 EF 40-45% admit LVAD cannula in apex. - INR goal 2 - 2.5. INR 2.5. Discussed warfarin dosing with PharmD personally. - Volume status looks good.  - [employer] with AKI - Continue Losartan 12.5 - Continue Amlodipine 10 mg  - Continue Toprol XL 25 mg daily  - Continue Hydral 50 TID 4. 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  AKI in setting of infection/ATN - SCr improving. Peaked at 2.22. 1.5 today 5.  CAD S/p CABG 12/20 - Continue Crestor 20 mg daily.  - no s/s angina 6.  Paroxysmal atrial fibrillation status post maze and left atrial appendage clip. NSR this admission. - Off amiodarone with hyperthyroidism. 7. Type 2 diabetes - SSI 8. Hx methamphetamine/polysubstance abuse: concern for ongoing drug-seeking behavior 9. Hx Hyperthyroidism:  Likely related to amiodarone. He is now off amiodarone and methimazole.  10. Anemia (chronic blood loss): stable - Trend CBC  11. COPD: Prior smoker. On trelegy at home 12. Ventral hernia: Large ventral hernia, soft and nontender.  Does not appear incarcerated.  No bowel obstruction on 09/23/23 abdominal CT.  Not candidate for surgery due to proximity of hernia to driveline tunnel. Intolerant abdominal binder.      Discharge Weight Range: 185 lb.  Discharge Vitals: Blood pressure 96/81, pulse 94, temperature 98 F (36.7 C), temperature source Oral, resp. rate 14, height 5\' 10"  (1.778 m), weight 84.7 kg, SpO2 100%.  Labs: Lab Results  Component Value Date   WBC 10.9 (H) 10/17/2023   HGB 9.9 (L) 10/17/2023   HCT 32.1 (L) 10/17/2023   MCV 79.7 (L) 10/17/2023   PLT 242 10/17/2023    Recent Labs  Lab 10/17/23 0659  NA 135  K 4.4  CL 103  CO2 24  BUN 22  CREATININE  1.50*  CALCIUM 8.5*  GLUCOSE 146*   Lab Results  Component Value Date   CHOL 175 07/09/2023   HDL 45 07/09/2023   LDLCALC 91 07/09/2023   TRIG 193 (H) 07/09/2023   BNP (last 3 results) Recent Labs    09/08/23 1940 10/08/23 2348  BNP 57.1 248.5*    ProBNP (last 3 results) No results for input(s): "PROBNP" in the last 8760 hours.   Diagnostic Studies/Procedures   [place] EKG SITE RITE  Result Date: 10/16/2023 If Site Rite image not attached, placement could not be confirmed due to current cardiac rhythm.   Discharge Medications   Allergies as of 10/17/2023       Reactions   Firvanq [vancomycin] Nausea And Vomiting, Other (See Comments)   Arthralgias         Medication List     STOP taking these medications    ciprofloxacin 750 MG tablet Commonly known as: CIPRO   gabapentin 300 MG capsule Commonly known as: NEURONTIN   mirtazapine 15 MG tablet Commonly known as: REMERON   spironolactone 25 MG tablet Commonly known as: ALDACTONE   traZODone 150 MG tablet Commonly known as: DESYREL       TAKE these medications    acetaminophen 325  MG tablet Commonly known as: TYLENOL Take 2 tablets (650 mg total) by mouth every 4 (four) hours as needed for headache or mild pain.   albuterol 108 (90 Base) MCG/ACT inhaler Commonly known as: VENTOLIN HFA Inhale 2 puffs into the lungs every 6 (six) hours as needed for wheezing or shortness of breath.   albuterol (2.5 MG/3ML) 0.083% nebulizer solution Commonly known as: PROVENTIL INHALE 3 ML BY NEBULIZATION EVERY 6 HOURS AS NEEDED FOR WHEEZING OR SHORTNESS OF BREATH   allopurinol 100 MG tablet Commonly known as: ZYLOPRIM Take 1 tablet (100 mg total) by mouth daily.   amLODipine 10 MG tablet Commonly known as: NORVASC Take 1 tablet (10 mg total) by mouth daily.   Biofreeze 10 % Crea Generic drug: Menthol (Topical Analgesic) Apply 1 Application topically 2 (two) times daily as needed (Apply to all joints for  pain).   colchicine 0.6 MG tablet Take 1 tablet (0.6 mg total) by mouth daily as needed (gout pain). What changed: when to take this   Combivent Respimat 20-100 MCG/ACT Aers respimat Generic drug: Ipratropium-Albuterol Inhale 1 puff into the lungs every 6 (six) hours.   Flonase Allergy Relief 50 MCG/ACT nasal spray Generic drug: fluticasone Place 2 sprays into both nostrils as needed for allergies or rhinitis.   glipiZIDE 5 MG tablet Commonly known as: Glucotrol Take 1 tablet (5 mg total) by mouth daily.   hydrALAZINE 50 MG tablet Commonly known as: APRESOLINE Take 1 tablet (50 mg total) by mouth every 8 (eight) hours.   HYDROmorphone 2 MG tablet Commonly known as: DILAUDID Take 1 tablet (2 mg total) by mouth every 6 (six) hours as needed for up to 10 days for severe pain (pain score 7-10). What changed: when to take this   HYDROmorphone HCl 8 MG Tb24 Commonly known as: EXALGO Take 1 tablet (8 mg total) by mouth daily for 5 days. Start taking on: October 18, 2023 What changed: Another medication with the same name was changed. Make sure you understand how and when to take each.   lidocaine 5 % Commonly known as: LIDODERM Place 1 patch onto the skin daily. Remove & Discard patch within 12 hours or as directed by MD   losartan 25 MG tablet Commonly known as: COZAAR Take 0.5 tablets (12.5 mg total) by mouth daily. What changed:  medication strength how much to take   meropenem IVPB Commonly known as: MERREM Inject 1 g into the vein every 8 (eight) hours. Indication:  Pseudomonas bacteremia in setting of LVAD First Dose: Yes Last Day of Therapy:  11/24/23 Labs - Once weekly:  CBC/D and BMP, Labs - Once weekly: ESR and CRP Method of administration: Mini-Bag Plus / Gravity Method of administration may be changed at the discretion of home infusion pharmacist based upon assessment of the patient and/or caregiver's ability to self-administer the medication ordered.    metFORMIN 1000 MG tablet Commonly known as: GLUCOPHAGE Take 1 tablet (1,000 mg total) by mouth 2 (two) times daily.   metoprolol succinate 25 MG 24 hr tablet Commonly known as: Toprol XL Take 1 tablet (25 mg total) by mouth daily.   minocycline 100 MG capsule Commonly known as: MINOCIN Take 2 capsules (200 mg total) by mouth 2 (two) times daily.   multivitamin with minerals Tabs tablet Take 1 tablet by mouth daily.   pantoprazole 40 MG tablet Commonly known as: PROTONIX Take 1 tablet (40 mg total) by mouth daily.   rosuvastatin 20 MG tablet Commonly known as:  CRESTOR Take 1 tablet (20 mg total) by mouth daily.   senna-docusate 8.6-50 MG tablet Commonly known as: Senokot-S Take 2 tablets by mouth at bedtime.   sertraline 50 MG tablet Commonly known as: ZOLOFT Take 1 tablet (50 mg total) by mouth daily. What changed: when to take this   tamsulosin 0.4 MG Caps capsule Commonly known as: FLOMAX Take 1 capsule (0.4 mg total) by mouth daily.   thiamine 100 MG tablet Commonly known as: Vitamin B-1 Take 1 tablet (100 mg total) by mouth daily.   Trelegy Ellipta 100-62.5-25 MCG/ACT Aepb Generic drug: Fluticasone-Umeclidin-Vilant Inhale 1 puff into the lungs daily.   warfarin 4 MG tablet Commonly known as: COUMADIN Take as directed. If you are unsure how to take this medication, talk to your nurse or doctor. Original instructions: Take 1-1.5 tablets (4-6 mg total) by mouth See admin instructions. Take 1.5 tablets (6 mg) every Wednesday, Take 1 tablet (4 mg) all other days or as directed by Heart Failure clinic   zinc gluconate 50 MG tablet Take 50 mg by mouth daily.               Durable Medical Equipment  (From admission, onward)           Start     Ordered   10/13/23 1256  Heart failure home health orders  (Heart failure home health orders / Face to face)  Once       Comments: Heart Failure Follow-up Care:  Verify follow-up appointments per Patient  Discharge Instructions. Confirm transportation arranged. Reconcile home medications with discharge medication list. Remove discontinued medications from use. Assist patient/caregiver to manage medications using pill box. Reinforce low sodium food selection Assessments: Vital signs and oxygen saturation at each visit. Assess home environment for safety concerns, caregiver support and availability of low-sodium foods. Consult Child psychotherapist, PT/OT, Dietitian, and CNA based on assessments. Perform comprehensive cardiopulmonary assessment. Notify MD for any change in condition or weight gain of 3 pounds in one day or 5 pounds in one week with symptoms. Daily Weights and Symptom Monitoring: Ensure patient has access to scales. Teach patient/caregiver to weigh daily before breakfast and after voiding using same scale and record.    Teach patient/caregiver to track weight and symptoms and when to notify Provider. Activity: Develop individualized activity plan with patient/caregiver.   Question Answer Comment  Heart Failure Follow-up Care [facility] ([facility] at [phone]   Obtain the following labs Basic Metabolic Panel   Lab frequency Weekly   Fax lab results to AHF Clinic at [phone]   Diet Low Sodium Heart Healthy   Fluid restrictions: 1800 mL Fluid      10/13/23 1256   10/10/23 1012  For home use only DME Hospital bed  Once       Comments: Therapeutic Mattress  Question Answer Comment  Length of Need Lifetime   Patient has (list medical condition): Heart Failure, LVAD, COPD   The above medical condition requires: Patient requires the ability to reposition immediately   Head must be elevated greater than: 45 degrees   Bed type Semi-electric      10/10/23 1012              Discharge Care Instructions  (From admission, onward)           Start     Ordered   10/17/23 0000  Discharge wound care:       Comments: Per LVAD RNs   10/17/23 1349  10/14/23  0000  Change dressing on IV access line weekly and PRN  (Home infusion instructions - [facility] )        10/14/23 1614            Disposition   The patient will be discharged in stable condition to home. Discharge Instructions     (HEART FAILURE PATIENTS) Call MD:  Anytime you have any of the following symptoms: 1) 3 pound weight gain in 24 hours or 5 pounds in 1 week 2) shortness of breath, with or without a dry hacking cough 3) swelling in the hands, feet or stomach 4) if you have to sleep on extra pillows at night in order to breathe.   Complete by: As directed    [facility] pharmacist to adjust dose for Vancomycin, Aminoglycosides and other anti-infective therapies as requested by physician.   Complete by: As directed    [facility] to provide Cath Flo 2mg    Complete by: As directed    Administer for PICC line occlusion and as ordered by physician for other access device issues.   Anaphylaxis Kit: Provided to treat any anaphylactic reaction to the medication being provided to the patient if First Dose or when requested by physician   Complete by: As directed    Epinephrine 1mg /ml vial / amp: Administer 0.3mg  (0.85ml) subcutaneously once for moderate to severe anaphylaxis, nurse to call physician and pharmacy when reaction occurs and call 911 if needed for immediate care   Diphenhydramine 50mg /ml IV vial: Administer 25-50mg  IV/IM PRN for first dose reaction, rash, itching, mild reaction, nurse to call physician and pharmacy when reaction occurs   Sodium Chloride 0.9% NS IV: Administer if needed for hypovolemic blood pressure drop or as ordered by physician after call to physician with anaphylactic reaction   Change dressing on IV access line weekly and PRN   Complete by: As directed    Diet - low sodium heart healthy   Complete by: As directed    Discharge wound care:   Complete by: As directed    Per LVAD RNs   Flush IV access with Sodium  Chloride 0.9% and Heparin 10 units/ml or 100 units/ml   Complete by: As directed    Home infusion instructions - [facility]   Complete by: As directed    Instructions: Flush IV access with Sodium Chloride 0.9% and Heparin 10units/ml or 100units/ml   Change dressing on IV access line: Weekly and PRN   Instructions Cath Flo 2mg : Administer for PICC Line occlusion and as ordered by physician for other access device   [facility] pharmacist to adjust dose for: Vancomycin, Aminoglycosides and other anti-infective therapies as requested by physician   Increase activity slowly   Complete by: As directed    Method of administration may be changed at the discretion of home infusion pharmacist based upon assessment of the patient and/or caregiver's ability to self-administer the medication ordered   Complete by: As directed        Follow-up Information     [facility] Follow up.   Specialty: Home Health Services Why: Home health has been arranged. They will contact you to scheudle apt within 48hrs post discharge. Contact information: [address] [place] [address] [phone]                   Duration of Discharge Encounter: Greater than 35 minutes   Signed, Tyann Niehaus L  Antania Hoefling AGACNP-BC  10/17/2023, 2:30 PM

## 2023-10-16 NOTE — Progress Notes (Signed)
LVAD Coordinator Rounding Note:  Admitted 10/09/23 to Heart Failure service from [facility] with worsening back pain and pt unable to eat or drink for 3-4 days.   CT abd/pelvis 10/09/23 1. No evidence of acute traumatic injury in the chest, abdomen, or pelvis. 2. LVAD is in unchanged in configuration compared with 09/08/2023. Similar thickening about the drive line. 3. Emphysema and chronic bronchitis. 4. Hepatic steatosis. 5. Nonobstructing left nephrolithiasis. 6. Large broad-based anterior abdominal wall hernia containing nonobstructed transverse colon and small bowel.   Pt laying in bed on my arrival. States he is feeling ok this morning. Denies complaints. Pt and wife requesting need for hospital bed at discharge. VAD Coordinator will reach out to case manager for assistance.  CT Spine/Thoracic Spine 11/3 without infection.   BC + for pseudomonas 10/31 & 11/4. Pt would benefit from home IV antibiotics. He has been drug free and complaint in the past few months. Pt agrees to continue to be compliant with treatment to abstain from drug use. Per ID team note 11/3- Cefepime/Daptomycin discontinued due to antibiotic resistance per culture results. Started on Meropenem 1 g every 8 hours.   Palliative care consult has been placed by provider team. Pt refused further palliative/ GOC discussion yesterday. Palliative has signed off.   Vital signs: Temp: 98.1 HR: 87 Doppler Pressure: 84 Automatic BP: 94/83 (89) O2 Sat: 97% on RA Wt: 178>185.5>182.5>184.5>185.4>185.6 lbs  LVAD interrogation reveals:  Speed: 5300 Flow: 4.3 Power: 4.1 w PI: 3.8  Alarms: none Events: 100+ PI events today Hct: 32  Fixed speed:  5600 Low speed limit:  5300  Drive Line: Existing VAD dressing removed and site care performed using sterile technique. Wound bed cleansed with VASHE solution. Skin surrounding wound bed cleaned with Chlora prep applicators x 2, allowed to dry. Followed by VASHE soaked 2x2 flush to  exit site and then covered with several dry 4 x 4s. Drive line partially incorporated. Driveline tunnels approx 5 cm. Small amount of thick yellow/serosanguinous drainage noted. No redness, tenderness, foul odor or rash noted. Ulcerated area distal to drive line cleansed with VASHE solution. VASHE moistened 2 x 2 placed over area and covered with gauze. Entire area covered with 2 large tegaderms. Drive line anchor reapplied. Continue daily wet to dry dressing changes using VASHE solution. Next dressing change due 10/17/23.      Pt had previous drive line debridement in OR with placement of instillation wound vac on 07/09/23 with Dr Donata Clay. This was changed to wet/dry on Friday 8/2. No further debridement of the VAD tunnel would be possible without injury to the outflow graft superiorly or the peritoneal cavity inferior to the power cord per Dr Donata Clay.   Labs:  LDH trend: 141>158>158>158>164  INR trend: 1.6>1.7>2.8>2.5>2.5>2.2  WBC trend: 15.4>19.6>14.5>12.5>12.5>12.0  Anticoagulation Plan: -INR Goal: 2.0 - 2.5 -ASA Dose: none  ICD: N/A  Infection:  10/09/23>> blood cxs>>  PSEUDOMONAS AERUGINOSA  10/13/23>> blood cxs>> NGTD  Drips:   Plan/Recommendations:  Page VAD coordinator with equipment issues or driveline problems Daily drive line wet to dry dressing changes with VASHE solution per bedside RN.   Simmie Davies RN,BSN VAD Coordinator  Office: [phone]  24/7 Pager: [phone]

## 2023-10-16 NOTE — Progress Notes (Signed)
   Durable Medical Equipment (From admission, onward)        Start     Ordered  10/13/23 1256  Heart failure home health orders  (Heart failure home health orders / Face to face)  Once      Comments: Heart Failure Follow-up Care:  Verify follow-up appointments per Patient Discharge Instructions. Confirm transportation arranged. Reconcile home medications with discharge medication list. Remove discontinued medications from use. Assist patient/caregiver to manage medications using pill box. Reinforce low sodium food selection Assessments: Vital signs and oxygen saturation at each visit. Assess home environment for safety concerns, caregiver support and availability of low-sodium foods. Consult Child psychotherapist, PT/OT, Dietitian, and CNA based on assessments. Perform comprehensive cardiopulmonary assessment. Notify MD for any change in condition or weight gain of 3 pounds in one day or 5 pounds in one week with symptoms. Daily Weights and Symptom Monitoring: Ensure patient has access to scales. Teach patient/caregiver to weigh daily before breakfast and after voiding using same scale and record.    Teach patient/caregiver to track weight and symptoms and when to notify Provider. Activity: Develop individualized activity plan with patient/caregiver.  Question Answer Comment Heart Failure Follow-up Care [facility] at [phone]  Obtain the following labs Basic Metabolic Panel  Lab frequency Weekly  Fax lab results to AHF Clinic at [phone]  Diet Low Sodium Heart Healthy  Fluid restrictions: 1800 mL Fluid    10/13/23 1256  10/10/23 1012  For home use only DME Hospital bed  Once      Comments: Therapeutic Mattress Question Answer Comment Length of Need Lifetime  Patient has (list medical condition): Heart Failure, LVAD, COPD  The above medical condition requires: Patient requires the ability to reposition immediately  Head must be elevated greater  than: 45 degrees  Bed type Semi-electric    10/10/23 1012

## 2023-10-16 NOTE — Progress Notes (Signed)
  Subjective: Patient examined, medical record reviewed. Patient has no complaints and is feeling much better after being placed on IV antibiotics for Pseudomonas bacteremia.  Last blood cultures are no growth for 72 hours and PICC line placement for 6 weeks of home IV meropenem has been approved by ID - with follow-up appointment in their office on December 10. Patient's VAD parameters are satisfactory and the driveline exit site wound stable with daily wet-to-dry Vashe dressing changes. Objective: Vital signs in last 24 hours: Temp:  [97.9 F (36.6 C)-98.4 F (36.9 C)] 98.4 F (36.9 C) (11/07 1110) Pulse Rate:  [90-95] 95 (11/07 0908) Cardiac Rhythm: Normal sinus rhythm (11/07 0735) Resp:  [14-22] 22 (11/07 1110) BP: (83-132)/(66-106) 106/85 (11/07 1110) SpO2:  [97 %-100 %] 97 % (11/07 1110) Weight:  [84.2 kg] 84.2 kg (11/07 4098)  Hemodynamic parameters for last 24 hours:  Sinus rhythm afebrile  Intake/Output from previous day: 11/06 0701 - 11/07 0700 In: 680 [P.O.:680] Out: 700 [Urine:700] Intake/Output this shift: No intake/output data recorded.  Exam Alert and comfortable Abdomen soft VAD tunnel wound dressing dry and intact Normal sinus rhythm, normal VAD hum Lungs clear   Lab Results: Recent Labs    10/15/23 0946 10/16/23 0950  WBC 12.0* 12.4*  HGB 9.8* 9.5*  HCT 31.0* 29.9*  PLT 244 198   BMET:  Recent Labs    10/15/23 0946 10/16/23 0950  NA 135 134*  K 4.7 4.3  CL 102 105  CO2 22 20*  GLUCOSE 251* 243*  BUN 20 21  CREATININE 1.35* 1.46*  CALCIUM 8.4* 8.0*    PT/INR:  Recent Labs    10/16/23 0950  LABPROT 27.5*  INR 2.5*   ABG    Component Value Date/Time   PHART 7.508 (H) 11/20/2020 0438   HCO3 24.8 09/09/2021 0313   TCO2 21 (L) 09/23/2023 1219   ACIDBASEDEF 6.0 (H) 11/24/2019 1718   O2SAT 63.4 09/09/2021 0313   CBG (last 3)  Recent Labs    10/15/23 2152 10/16/23 0632 10/16/23 1108  GLUCAP 120* 164* 170*     Assessment/Plan: S/P  Patient should be ready for discharge home tomorrow once home health arrangements for IV antibiotic have been completed.  He will continue his oral suppressive oral antibiotic minocycline for MRSA. Plan for outpatient care and follow-up was discussed with patient and wife.  LOS: 7 days    Lovett Sox 10/16/2023

## 2023-10-16 NOTE — TOC Progression Note (Signed)
Transition of Care (TOC) - Progression Note    Patient Details  Name: Kevin Underwood. MRN: [id number] Date of Birth: 1961-08-27  Transition of Care [facility]) CM/SW Contact  Harriet Masson, RN Phone Number: 10/16/2023, 12:41 PM  Clinical Narrative:    Thad Ranger with [employer] of plan to discharge home with IV antibiotics.  Kandee Keen with [name] accepted referral for RN, PT. TOC following.    Expected Discharge Plan: Home w Home Health Services Barriers to Discharge: Continued Medical Work up  Expected Discharge Plan and Services   Discharge Planning Services: CM Consult Post Acute Care Choice: Home Health Living arrangements for the past 2 months: Single Family Home                 DME Arranged: Hospital bed DME Agency: [employer] Date DME Agency Contacted: 10/16/23 Time DME Agency Contacted: 1235 Representative spoke with at DME Agency: Marthann Schiller HH Arranged: RN HH Agency: [facility] Date [facility] Agency Contacted: 10/10/23 Time HH Agency Contacted: [phone] Representative spoke with at [facility] Agency: Lorenza Chick   Social Determinants of Health (SDOH) Interventions SDOH Screenings   Food Insecurity: No Food Insecurity (10/09/2023)  Housing: Low Risk  (10/09/2023)  Transportation Needs: No Transportation Needs (10/09/2023)  Recent Concern: Transportation Needs - Unmet Transportation Needs (09/08/2023)  Utilities: Not At Risk (10/09/2023)  Alcohol Screen: Low Risk  (11/10/2020)  Depression (PHQ2-9): Low Risk  (10/16/2022)  Financial Resource Strain: Medium Risk (05/17/2022)  Physical Activity: Inactive (11/10/2020)  Tobacco Use: Medium Risk (10/08/2023)    Readmission Risk Interventions    07/01/2022    9:05 AM  Readmission Risk Prevention Plan  Transportation Screening Complete  Medication Review (RN Care Manager) Complete  PCP or Specialist appointment within 3-5 days of discharge Complete  HRI or Home Care Consult Complete  SW Recovery Care/Counseling Consult  Complete  Palliative Care Screening Not Applicable  Skilled Nursing Facility Not Applicable

## 2023-10-16 NOTE — Progress Notes (Signed)
CSW attempted to visit patient although sleeping at the time of visit. CSW will attempt again tomorrow. Lasandra Beech, LCSW, CCSW-MCS [phone]

## 2023-10-16 NOTE — Progress Notes (Signed)
PHARMACY - ANTICOAGULATION CONSULT NOTE  Pharmacy Consult for warfarin Indication:  LVAD HM3  Allergies  Allergen Reactions   Firvanq [Vancomycin] Nausea And Vomiting and Other (See Comments)    Arthralgias     Patient Measurements: Height: 5\' 10"  (177.8 cm) Weight: 84.2 kg (185 lb 10 oz) IBW/kg (Calculated) : 73 Heparin Dosing Weight: 77.1 kg   Vital Signs: Temp: 98.4 F (36.9 C) (11/07 1110) Temp Source: Oral (11/07 1110) BP: 106/85 (11/07 1110) Pulse Rate: 95 (11/07 0908)  Labs: Recent Labs    10/14/23 0801 10/15/23 0946 10/16/23 0950  HGB 10.0* 9.8* 9.5*  HCT 31.6* 31.0* 29.9*  PLT 258 244 198  LABPROT 27.1* 24.5* 27.5*  INR 2.5* 2.2* 2.5*  CREATININE 1.35* 1.35* 1.46*    Estimated Creatinine Clearance: 54.2 mL/min (A) (by C-G formula based on SCr of 1.46 mg/dL (H)).   Medical History: Past Medical History:  Diagnosis Date   "    Arthritis    CAD (coronary artery disease)    a. s/p CABG in 11/2019 with LIMA-LAD, SVG-OM1, SVG-PDA and SVG-D1   CHF (congestive heart failure) (HCC)    a. EF < 20% by echo in 11/2019   Essential hypertension    PAF (paroxysmal atrial fibrillation) (HCC)    Type 2 diabetes mellitus (HCC)     Medications:  Scheduled:   amLODipine  10 mg Oral Daily   Chlorhexidine Gluconate Cloth  6 each Topical Daily   hydrALAZINE  50 mg Oral Q8H   HYDROmorphone HCl  8 mg Oral Q24H   insulin aspart  0-15 Units Subcutaneous TID WC   insulin aspart  0-5 Units Subcutaneous QHS   insulin glargine-yfgn  10 Units Subcutaneous Daily   losartan  12.5 mg Oral Daily   metoprolol succinate  25 mg Oral Daily   minocycline  200 mg Oral BID   multivitamin with minerals  1 tablet Oral Daily   pantoprazole  40 mg Oral Daily   polyethylene glycol  17 g Oral Daily   rosuvastatin  20 mg Oral Daily   senna  1 tablet Oral BID   sertraline  50 mg Oral QHS   sodium chloride flush  10-40 mL Intracatheter Q12H   warfarin  4 mg Oral ONCE-1600   Warfarin -  Pharmacist Dosing Inpatient   Does not apply q1600   Assessment: 62 yom presenting with SOB and cough - on warfarin PTA for hx LVAD HM3 (LD 10/30).  PTA regimen is 4 mg daily except 6 mg Wed.   INR is therapeutic at 2.2. Hgb 9.8, plt 244. LDH 64. No s/sx of bleeding.   Goal of Therapy:  INR 2-2.5 Monitor platelets by anticoagulation protocol: Yes   Plan:  Order warfarin at 4 mg tonight to match PTA regimen Monitor daily INR, CBC, and for s/sx of bleeding   Reece Leader, Loura Back, BCPS, BCCP Clinical Pharmacist  10/16/2023 1:08 PM   [facility] pharmacy phone numbers are listed on [email or web address]

## 2023-10-16 NOTE — Plan of Care (Signed)
  Problem: Education: Goal: Patient will understand all VAD equipment and how it functions Outcome: Progressing Goal: Patient will be able to verbalize current INR target range and antiplatelet therapy for discharge home Outcome: Progressing   Problem: Cardiac: Goal: LVAD will function as expected and patient will experience no clinical alarms Outcome: Progressing   Problem: Education: Goal: Knowledge of General Education information will improve Description: Including pain rating scale, medication(s)/side effects and non-pharmacologic comfort measures Outcome: Progressing   Problem: Health Behavior/Discharge Planning: Goal: Ability to manage health-related needs will improve Outcome: Progressing   Problem: Clinical Measurements: Goal: Ability to maintain clinical measurements within normal limits will improve Outcome: Progressing Goal: Will remain free from infection Outcome: Progressing Goal: Diagnostic test results will improve Outcome: Progressing Goal: Respiratory complications will improve Outcome: Progressing Goal: Cardiovascular complication will be avoided Outcome: Progressing   Problem: Activity: Goal: Risk for activity intolerance will decrease Outcome: Progressing   Problem: Nutrition: Goal: Adequate nutrition will be maintained Outcome: Progressing   Problem: Coping: Goal: Level of anxiety will decrease Outcome: Progressing   Problem: Elimination: Goal: Will not experience complications related to bowel motility Outcome: Progressing Goal: Will not experience complications related to urinary retention Outcome: Progressing   Problem: Pain Management: Goal: General experience of comfort will improve Outcome: Progressing   Problem: Safety: Goal: Ability to remain free from injury will improve Outcome: Progressing   Problem: Skin Integrity: Goal: Risk for impaired skin integrity will decrease Outcome: Progressing   Problem: Education: Goal:  Ability to describe self-care measures that may prevent or decrease complications (Diabetes Survival Skills Education) will improve Outcome: Progressing Goal: Individualized Educational Video(s) Outcome: Progressing   Problem: Coping: Goal: Ability to adjust to condition or change in health will improve Outcome: Progressing   Problem: Fluid Volume: Goal: Ability to maintain a balanced intake and output will improve Outcome: Progressing   Problem: Health Behavior/Discharge Planning: Goal: Ability to identify and utilize available resources and services will improve Outcome: Progressing Goal: Ability to manage health-related needs will improve Outcome: Progressing   Problem: Metabolic: Goal: Ability to maintain appropriate glucose levels will improve Outcome: Progressing   Problem: Nutritional: Goal: Maintenance of adequate nutrition will improve Outcome: Progressing Goal: Progress toward achieving an optimal weight will improve Outcome: Progressing   Problem: Skin Integrity: Goal: Risk for impaired skin integrity will decrease Outcome: Progressing   Problem: Tissue Perfusion: Goal: Adequacy of tissue perfusion will improve Outcome: Progressing

## 2023-10-16 NOTE — Progress Notes (Signed)
Advanced Heart Failure VAD Team Note  PCP-Cardiologist: Nona Dell, MD   Subjective:    Here w/ chronic driveline infection with Pseudomonas, now Cipro resistant.  Old PICC removed 11/4. Repeat BCx from 11/4 NG x 3 days  Afebrile. No am labs drawn yet.   Laying in bed. Feels ok. Denies fever/chills. No dyspnea. Abdominal pain from chronic ventral hernia controlled. No current complaints. Wife present at bedside.    LVAD Interrogation HM III: Speed: 5600 Flow: 4.2 PI: 2.9 Power: 4.1. Numerous PI events, baseline for patient.  Objective:    Vital Signs:   Temp:  [97.9 F (36.6 C)-98.3 F (36.8 C)] 98.3 F (36.8 C) (11/07 4098) Pulse Rate:  [90-101] 90 (11/07 0638) Resp:  [14-18] 14 (11/07 1191) BP: (83-132)/(66-106) 132/106 (11/07 0638) SpO2:  [97 %-100 %] 99 % (11/07 4782) Weight:  [84.2 kg] 84.2 kg (11/07 0638) Last BM Date : 10/15/23 Mean arterial Pressure 100-110s  Intake/Output:   Intake/Output Summary (Last 24 hours) at 10/16/2023 0728 Last data filed at 10/16/2023 0630 Gross per 24 hour  Intake 680 ml  Output 700 ml  Net -20 ml     Physical Exam    General: chronically ill appearing, laying supine in bed w/o any respiratory difficulty  HEENT: neck supple.   Cardiac: JVD not elevated . Mechanical heart sounds with LVAD hum present.  Resp: CTAB Abdomen: Soft, non-tender, non-distended. + BS. + large ventral hernia, soft and reducible  Driveline: Dressing C/D/I. No drainage or redness. Anchor in place. Extremities: Warm and dry. No rash, cyanosis, or edema Neuro: Alert and oriented x3. Affect pleasant. Moves all extremities without difficulty.  Telemetry   NSR 90s w/ 2 brief runs of NSVT 3-4 beats (personally reviewed)  Labs   Basic Metabolic Panel: Recent Labs  Lab 10/11/23 1233 10/12/23 0630 10/13/23 0520 10/14/23 0801 10/15/23 0946  NA 135 136 136 133* 135  K 4.1 4.2 4.6 4.5 4.7  CL 104 100 103 99 102  CO2 22 25 25 22 22   GLUCOSE  181* 166* 159* 262* 251*  BUN 34* 34* 25* 27* 20  CREATININE 1.67* 1.61* 1.50* 1.35* 1.35*  CALCIUM 8.4* 8.5* 8.5* 8.3* 8.4*  MG 1.8 2.2 1.9 1.6* 2.0    Liver Function Tests: No results for input(s): "AST", "ALT", "ALKPHOS", "BILITOT", "PROT", "ALBUMIN" in the last 168 hours.  No results for input(s): "LIPASE", "AMYLASE" in the last 168 hours. No results for input(s): "AMMONIA" in the last 168 hours.  CBC: Recent Labs  Lab 10/11/23 1233 10/12/23 0630 10/13/23 0520 10/14/23 0801 10/15/23 0946  WBC 10.1 10.3 14.5* 12.5* 12.0*  HGB 9.0* 9.4* 9.9* 10.0* 9.8*  HCT 28.2* 29.2* 30.9* 31.6* 31.0*  MCV 78.1* 78.9* 77.8* 78.8* 79.1*  PLT 252 250 267 258 244   INR: Recent Labs  Lab 10/11/23 1233 10/12/23 0630 10/13/23 0520 10/14/23 0801 10/15/23 0946  INR 2.0* 2.2* 2.8* 2.5* 2.2*   Imaging   No results found.  Medications:    Scheduled Medications:  amLODipine  10 mg Oral Daily   Chlorhexidine Gluconate Cloth  6 each Topical Daily   hydrALAZINE  50 mg Oral Q8H   HYDROmorphone HCl  8 mg Oral Q24H   insulin aspart  0-15 Units Subcutaneous TID WC   insulin aspart  0-5 Units Subcutaneous QHS   insulin glargine-yfgn  10 Units Subcutaneous Daily   losartan  12.5 mg Oral Daily   metoprolol succinate  25 mg Oral Daily   minocycline  200  mg Oral BID   multivitamin with minerals  1 tablet Oral Daily   pantoprazole  40 mg Oral Daily   polyethylene glycol  17 g Oral Daily   rosuvastatin  20 mg Oral Daily   senna  1 tablet Oral BID   sertraline  50 mg Oral QHS   sodium chloride flush  10-40 mL Intracatheter Q12H   Warfarin - Pharmacist Dosing Inpatient   Does not apply q1600    Infusions:  meropenem (MERREM) IV 1 g (10/16/23 0620)    PRN Medications: acetaminophen, fluticasone, hydrALAZINE, HYDROmorphone, ipratropium-albuterol, ondansetron (ZOFRAN) IV, sodium chloride flush  Assessment/Plan:    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 Cultures have  grown acinetobacter, MRSA and pseudomonas Discharged from the hospital on 09/04/2023 with recurrent driveline infection growing Pseudomonas. On chronic minocycline and ciprofloxacin at home. Wound culture 10/1 grew MRSA. Readmitted 10/30 Bcx 2/2 Pseudomonas. CT ok procal 53 - Remains AF on IV abx - PICC discontinued 11/4 at request of ID, as it was placed prior to cleared blood culture.  - Cefepime and Dapto switched to Merem per ID. On minocycline for acinetobacter. - Unfortunately pseudomonas is Cipro resistant which leaves [place] without PO suppressive options - Will need single lumen PICC prior to discharge for home IV abx once BCX clear 72hr. Today is day 3 of clear blood cx. Bcx 11/4 NG x 3 days.  Will place order for home PICC. Ideally would go to SNF given h/o drug use but has refused. - Refused GOC discussion with Palliative Care, has now signed off.   2. Abdominal pain with n/v:  CT 10/31 unremarkable Has struggled with ileus likely related to pain meds - On 8mg  daily exalgo, 2 mg PRN dilaudid Q6, +lidocaine patch - Continue home pain regimen. Adjust as needed. - Continue aggressive bowel regimen  3.  Chronic systolic CHF s/p Heartmate 3 LVAD on 11/17/20. Echo 10/11/23 EF 40-45% admit LVAD cannula in apex. - INR goal 2 - 2.5. Labs pending this am. Discussed warfarin dosing with PharmD personally. - Volume status looks good.  - [employer] with AKI - Continue Losartan 12.5 - Continue Amlodipine 10 mg  - Continue Toprol XL 25 mg daily  - Continue Hydral 50 TID  4. 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  AKI in setting of infection/ATN - SCr improving. Peaked at 2.22. 1.35 yesterday. F/u BMP today pending.   5.  CAD S/p CABG 12/20 - Continue Crestor 20 mg daily.  - no s/s angina   6.  Paroxysmal atrial fibrillation status post maze and left atrial appendage clip. NSR this admission. - Off amiodarone with hyperthyroidism.   7.  Type 2 diabetes - SSI   8. Hx methamphetamine/polysubstance abuse: concern for ongoing drug-seeking behavior   9. Hx Hyperthyroidism:  Likely related to amiodarone. He is now off amiodarone and methimazole.    10. Anemia (chronic blood loss): stable - Trend CBC    11. COPD: Prior smoker. On trelegy at home   12. Ventral hernia: Large ventral hernia, soft and nontender.  Does not appear incarcerated.  No bowel obstruction on 09/23/23 abdominal CT.  Not candidate for surgery due to proximity of hernia to driveline tunnel. Intolerant abdominal binder.    Plan placement of single lumen picc w/ d/c home w/ meropenum today if ID agrees.   Length of Stay: [address] PA-C 10/16/2023, 7:28 AM

## 2023-10-17 ENCOUNTER — Other Ambulatory Visit (HOSPITAL_COMMUNITY): Payer: Self-pay

## 2023-10-17 DIAGNOSIS — I5022 Chronic systolic (congestive) heart failure: Secondary | ICD-10-CM | POA: Diagnosis not present

## 2023-10-17 DIAGNOSIS — T827XXA Infection and inflammatory reaction due to other cardiac and vascular devices, implants and grafts, initial encounter: Secondary | ICD-10-CM | POA: Diagnosis not present

## 2023-10-17 LAB — CBC
HCT: 32.1 % — ABNORMAL LOW (ref 39.0–52.0)
Hemoglobin: 9.9 g/dL — ABNORMAL LOW (ref 13.0–17.0)
MCH: 24.6 pg — ABNORMAL LOW (ref 26.0–34.0)
MCHC: 30.8 g/dL (ref 30.0–36.0)
MCV: 79.7 fL — ABNORMAL LOW (ref 80.0–100.0)
Platelets: 242 10*3/uL (ref 150–400)
RBC: 4.03 MIL/uL — ABNORMAL LOW (ref 4.22–5.81)
RDW: 15.8 % — ABNORMAL HIGH (ref 11.5–15.5)
WBC: 10.9 10*3/uL — ABNORMAL HIGH (ref 4.0–10.5)
nRBC: 0 % (ref 0.0–0.2)

## 2023-10-17 LAB — PROTIME-INR
INR: 2.5 — ABNORMAL HIGH (ref 0.8–1.2)
Prothrombin Time: 27.5 s — ABNORMAL HIGH (ref 11.4–15.2)

## 2023-10-17 LAB — BASIC METABOLIC PANEL
Anion gap: 8 (ref 5–15)
BUN: 22 mg/dL (ref 8–23)
CO2: 24 mmol/L (ref 22–32)
Calcium: 8.5 mg/dL — ABNORMAL LOW (ref 8.9–10.3)
Chloride: 103 mmol/L (ref 98–111)
Creatinine, Ser: 1.5 mg/dL — ABNORMAL HIGH (ref 0.61–1.24)
GFR, Estimated: 52 mL/min — ABNORMAL LOW (ref >60)
Glucose, Bld: 146 mg/dL — ABNORMAL HIGH (ref 70–99)
Potassium: 4.4 mmol/L (ref 3.5–5.1)
Sodium: 135 mmol/L (ref 135–145)

## 2023-10-17 LAB — LACTATE DEHYDROGENASE: LDH: 145 U/L (ref 98–192)

## 2023-10-17 LAB — GLUCOSE, CAPILLARY
Glucose-Capillary: 163 mg/dL — ABNORMAL HIGH (ref 70–99)
Glucose-Capillary: 219 mg/dL — ABNORMAL HIGH (ref 70–99)

## 2023-10-17 LAB — MAGNESIUM: Magnesium: 1.7 mg/dL (ref 1.7–2.4)

## 2023-10-17 MED ORDER — HYDROMORPHONE HCL ER 8 MG PO TB24: 8.0000 mg | ORAL_TABLET | ORAL | 0 refills | Status: DC

## 2023-10-17 MED ORDER — HYDROMORPHONE HCL ER 8 MG PO TB24
8.0000 mg | ORAL_TABLET | ORAL | 0 refills | Status: DC
  Filled 2023-10-17: qty 5, 5d supply, fill #0

## 2023-10-17 MED ORDER — HYDROMORPHONE HCL 2 MG PO TABS
2.0000 mg | ORAL_TABLET | Freq: Four times a day (QID) | ORAL | 0 refills | Status: AC | PRN
  Filled 2023-10-17: qty 40, 10d supply, fill #0

## 2023-10-17 MED ORDER — HYDROMORPHONE HCL ER 8 MG PO TB24
8.0000 mg | ORAL_TABLET | ORAL | 0 refills | Status: DC
  Filled 2023-10-17: qty 10, 10d supply, fill #0

## 2023-10-17 MED ORDER — WARFARIN SODIUM 4 MG PO TABS
4.0000 mg | ORAL_TABLET | Freq: Once | ORAL | Status: AC
  Administered 2023-10-17: 4 mg via ORAL
  Filled 2023-10-17: qty 1

## 2023-10-17 MED ORDER — MAGNESIUM SULFATE 2 GM/50ML IV SOLN
2.0000 g | Freq: Once | INTRAVENOUS | Status: AC
Start: 2023-10-17 — End: 2023-10-17
  Administered 2023-10-17: 2 g via INTRAVENOUS
  Filled 2023-10-17: qty 50

## 2023-10-17 NOTE — Progress Notes (Addendum)
LVAD Coordinator Rounding Note:  Admitted 10/09/23 to Heart Failure service from [facility] with worsening back pain and pt unable to eat or drink for 3-4 days.   CT abd/pelvis 10/09/23 1. No evidence of acute traumatic injury in the chest, abdomen, or pelvis. 2. LVAD is in unchanged in configuration compared with 09/08/2023. Similar thickening about the drive line. 3. Emphysema and chronic bronchitis. 4. Hepatic steatosis. 5. Nonobstructing left nephrolithiasis. 6. Large broad-based anterior abdominal wall hernia containing nonobstructed transverse colon and small bowel.   Pt laying in bed on my arrival. States he is feeling ok this morning. Denies complaints.   CT Spine/Thoracic Spine 11/3 without infection.   BC + for pseudomonas 10/31 & 11/4. Pt would benefit from home IV antibiotics. He has been drug free and complaint in the past few months. Pt agrees to continue to be compliant with treatment to abstain from drug use. Per ID team note 11/3- Cefepime/Daptomycin discontinued due to antibiotic resistance per culture results. On Meropenem 1 g every 8 hours. Discharge antibiotic plan: PO Minocycline 200 mg BID and Meropenem 1 gm every 8 hrs for 6 weeks. Meropenem end date: 11/24/23. Jeri Modena plans to do PICC teaching this afternoon with pt's wife. PICC placement scheduled for this morning.   Palliative care consult has been placed by provider team. Pt refused further palliative/ GOC discussion. Palliative has signed off.   VAD clinic d/u f/u 11/20 at 10:00. Provided with 14 daily kits, 14 anchors, 4 boxes of large tegaderms, and a large bottle of VASHE for home use.   Vital signs: Temp: 98.2 HR: 97 Doppler Pressure: 78 Automatic BP: 107/86 O2 Sat: 97% on RA Wt: 178>185.5>182.5>184.5>185.4>185.6>186.7 lbs  LVAD interrogation reveals:  Speed: 5300 Flow: 4.2 Power: 4.2 w PI: 3.3  Alarms: none Events: 100+ PI events today Hct: 32  Fixed speed:  5600 Low speed limit:   5300  Drive Line: Existing VAD dressing removed and site care performed using sterile technique. Wound bed cleansed with VASHE solution. Skin surrounding wound bed cleaned with Chlora prep applicators x 2, allowed to dry. Followed by VASHE soaked 2x2 flush to exit site and then covered with several dry 4 x 4s. Drive line partially incorporated. Driveline tunnels approx 5 cm. Small amount of thick yellow/serosanguinous drainage noted. No redness, tenderness, foul odor or rash noted. Ulcerated area distal to drive line cleansed with VASHE solution. VASHE moistened 2 x 2 placed over area and covered with gauze. Entire area covered with 2 large tegaderms. Drive line anchor reapplied. Continue daily wet to dry dressing changes using VASHE solution. Next dressing change due 10/18/23.   Pt had previous drive line debridement in OR with placement of instillation wound vac on 07/09/23 with Dr Donata Clay. This was changed to wet/dry on Friday 8/2. No further debridement of the VAD tunnel would be possible without injury to the outflow graft superiorly or the peritoneal cavity inferior to the power cord per Dr Donata Clay.   Labs:  LDH trend: 141>158>158>158>164>145  INR trend: 1.6>1.7>2.8>2.5>2.5>2.2>2.5  WBC trend: 15.4>19.6>14.5>12.5>12.5>12.0>10.9  Anticoagulation Plan: -INR Goal: 2.0 - 2.5 -ASA Dose: none  ICD: N/A  Infection:  10/09/23>> blood cxs>>  PSEUDOMONAS AERUGINOSA  10/13/23>> blood cxs>> NGTD  Drips:   Plan/Recommendations:  Page VAD coordinator with equipment issues or driveline problems Daily drive line wet to dry dressing changes with VASHE solution per bedside RN.   Alyce Pagan RN VAD Coordinator  Office: [phone]  24/7 Pager: [phone]

## 2023-10-17 NOTE — TOC Progression Note (Signed)
Transition of Care (TOC) - Progression Note    Patient Details  Name: Kevin Underwood. MRN: [id number] Date of Birth: [birth year]  Transition of Care [facility]) CM/SW Contact  Leone Haven, RN Phone Number: 10/17/2023, 12:27 PM  Clinical Narrative:    Patient can independently use the hospital bed controls.   Expected Discharge Plan: Home w Home Health Services Barriers to Discharge: Continued Medical Work up  Expected Discharge Plan and Services   Discharge Planning Services: CM Consult Post Acute Care Choice: Home Health Living arrangements for the past 2 months: Single Family Home                 DME Arranged: Hospital bed DME Agency: [employer] Date DME Agency Contacted: 10/16/23 Time DME Agency Contacted: 1235 Representative spoke with at DME Agency: Marthann Schiller HH Arranged: RN HH Agency: [facility] Date [facility] Agency Contacted: 10/10/23 Time HH Agency Contacted: [phone] Representative spoke with at [facility] Agency: Lorenza Chick   Social Determinants of Health (SDOH) Interventions SDOH Screenings   Food Insecurity: No Food Insecurity (10/09/2023)  Housing: Low Risk  (10/09/2023)  Transportation Needs: No Transportation Needs (10/09/2023)  Recent Concern: Transportation Needs - Unmet Transportation Needs (09/08/2023)  Utilities: Not At Risk (10/09/2023)  Alcohol Screen: Low Risk  (11/10/2020)  Depression (PHQ2-9): Low Risk  (10/16/2022)  Financial Resource Strain: Medium Risk (05/17/2022)  Physical Activity: Inactive (11/10/2020)  Tobacco Use: Medium Risk (10/08/2023)    Readmission Risk Interventions    07/01/2022    9:05 AM  Readmission Risk Prevention Plan  Transportation Screening Complete  Medication Review (RN Care Manager) Complete  PCP or Specialist appointment within 3-5 days of discharge Complete  HRI or Home Care Consult Complete  SW Recovery Care/Counseling Consult Complete  Palliative Care Screening Not Applicable  Skilled Nursing Facility  Not Applicable

## 2023-10-17 NOTE — TOC Progression Note (Signed)
Transition of Care (TOC) - Progression Note    Patient Details  Name: Kevin Underwood. MRN: [id number] Date of Birth: 06/01/1961  Transition of Care [facility]) CM/SW Contact  Nicanor Bake Phone Number: [phone] 10/17/2023, 11:25 AM  Clinical Narrative: HF CSW met with pts wife in the hallway. Pts wife stated that she ws tired, but happy the pt is feeling better. Pts wife stated that they will be celebrating 37 years of marriage next week. Pt wife was bringing the pt [name] for lunch.   TOC will continue following.       Expected Discharge Plan: Home w Home Health Services Barriers to Discharge: Continued Medical Work up  Expected Discharge Plan and Services   Discharge Planning Services: CM Consult Post Acute Care Choice: Home Health Living arrangements for the past 2 months: Single Family Home                 DME Arranged: Hospital bed DME Agency: [employer] Date DME Agency Contacted: 10/16/23 Time DME Agency Contacted: 1235 Representative spoke with at DME Agency: Marthann Schiller HH Arranged: RN HH Agency: [facility] Date [facility] Agency Contacted: 10/10/23 Time HH Agency Contacted: [phone] Representative spoke with at [facility] Agency: Lorenza Chick   Social Determinants of Health (SDOH) Interventions SDOH Screenings   Food Insecurity: No Food Insecurity (10/09/2023)  Housing: Low Risk  (10/09/2023)  Transportation Needs: No Transportation Needs (10/09/2023)  Recent Concern: Transportation Needs - Unmet Transportation Needs (09/08/2023)  Utilities: Not At Risk (10/09/2023)  Alcohol Screen: Low Risk  (11/10/2020)  Depression (PHQ2-9): Low Risk  (10/16/2022)  Financial Resource Strain: Medium Risk (05/17/2022)  Physical Activity: Inactive (11/10/2020)  Tobacco Use: Medium Risk (10/08/2023)    Readmission Risk Interventions    07/01/2022    9:05 AM  Readmission Risk Prevention Plan  Transportation Screening Complete  Medication Review (RN Care Manager) Complete   PCP or Specialist appointment within 3-5 days of discharge Complete  HRI or Home Care Consult Complete  SW Recovery Care/Counseling Consult Complete  Palliative Care Screening Not Applicable  Skilled Nursing Facility Not Applicable

## 2023-10-17 NOTE — TOC Transition Note (Signed)
Transition of Care [facility]) - CM/SW Discharge Note   Patient Details  Name: Kevin Underwood. MRN: [id number] Date of Birth: October 12, 1961  Transition of Care [facility]) CM/SW Contact:  Leone Haven, RN Phone Number: 10/17/2023, 2:33 PM   Clinical Narrative:    Per Mitch with [employer], they will be delivering the hospital bed.  [name] to do infusion teaching with patient and wife prior to dc today.      Barriers to Discharge: Continued Medical Work up   Patient Goals and CMS Choice CMS Medicare.gov Compare Post Acute Care list provided to:: Patient Choice offered to / list presented to : Patient  Discharge Placement                         Discharge Plan and Services Additional resources added to the After Visit Summary for     Discharge Planning Services: CM Consult Post Acute Care Choice: Home Health          DME Arranged: Hospital bed DME Agency: [employer] Date DME Agency Contacted: 10/16/23 Time DME Agency Contacted: [phone] Representative spoke with at DME Agency: Marthann Schiller HH Arranged: RN HH Agency: [facility] Date [facility] Agency Contacted: 10/10/23 Time HH Agency Contacted: [phone] Representative spoke with at [facility] Agency: Lorenza Chick  Social Determinants of Health (SDOH) Interventions SDOH Screenings   Food Insecurity: No Food Insecurity (10/09/2023)  Housing: Low Risk  (10/09/2023)  Transportation Needs: No Transportation Needs (10/09/2023)  Recent Concern: Transportation Needs - Unmet Transportation Needs (09/08/2023)  Utilities: Not At Risk (10/09/2023)  Alcohol Screen: Low Risk  (11/10/2020)  Depression (PHQ2-9): Low Risk  (10/16/2022)  Financial Resource Strain: Medium Risk (05/17/2022)  Physical Activity: Inactive (11/10/2020)  Tobacco Use: Medium Risk (10/08/2023)     Readmission Risk Interventions    07/01/2022    9:05 AM  Readmission Risk Prevention Plan  Transportation Screening Complete  Medication Review (RN Care Manager) Complete  PCP  or Specialist appointment within 3-5 days of discharge Complete  HRI or Home Care Consult Complete  SW Recovery Care/Counseling Consult Complete  Palliative Care Screening Not Applicable  Skilled Nursing Facility Not Applicable

## 2023-10-17 NOTE — Progress Notes (Signed)
PHARMACY - ANTICOAGULATION CONSULT NOTE  Pharmacy Consult for warfarin Indication:  LVAD HM3  Allergies  Allergen Reactions   Firvanq [Vancomycin] Nausea And Vomiting and Other (See Comments)    Arthralgias     Patient Measurements: Height: 5\' 10"  (177.8 cm) Weight: 84.7 kg (186 lb 11.7 oz) IBW/kg (Calculated) : 73 Heparin Dosing Weight: 77.1 kg   Vital Signs: Temp: 98 F (36.7 C) (11/08 1116) Temp Source: Oral (11/08 1116) BP: 96/81 (11/08 1116) Pulse Rate: 94 (11/08 1116)  Labs: Recent Labs    10/15/23 0946 10/16/23 0950 10/17/23 0659  HGB 9.8* 9.5* 9.9*  HCT 31.0* 29.9* 32.1*  PLT 244 198 242  LABPROT 24.5* 27.5* 27.5*  INR 2.2* 2.5* 2.5*  CREATININE 1.35* 1.46* 1.50*    Estimated Creatinine Clearance: 52.7 mL/min (A) (by C-G formula based on SCr of 1.5 mg/dL (H)).   Medical History: Past Medical History:  Diagnosis Date   "    Arthritis    CAD (coronary artery disease)    a. s/p CABG in 11/2019 with LIMA-LAD, SVG-OM1, SVG-PDA and SVG-D1   CHF (congestive heart failure) (HCC)    a. EF < 20% by echo in 11/2019   Essential hypertension    PAF (paroxysmal atrial fibrillation) (HCC)    Type 2 diabetes mellitus (HCC)     Medications:  Scheduled:   amLODipine  10 mg Oral Daily   Chlorhexidine Gluconate Cloth  6 each Topical Daily   hydrALAZINE  50 mg Oral Q8H   HYDROmorphone HCl  8 mg Oral Q24H   insulin aspart  0-15 Units Subcutaneous TID WC   insulin aspart  0-5 Units Subcutaneous QHS   insulin glargine-yfgn  10 Units Subcutaneous Daily   losartan  12.5 mg Oral Daily   metoprolol succinate  25 mg Oral Daily   minocycline  200 mg Oral BID   multivitamin with minerals  1 tablet Oral Daily   pantoprazole  40 mg Oral Daily   polyethylene glycol  17 g Oral Daily   rosuvastatin  20 mg Oral Daily   senna  1 tablet Oral BID   sertraline  50 mg Oral QHS   sodium chloride flush  10-40 mL Intracatheter Q12H   Warfarin - Pharmacist Dosing Inpatient   Does  not apply q1600   Assessment: 62 yom presenting with SOB and cough - on warfarin PTA for hx LVAD HM3 (LD 10/30).  PTA regimen is 4 mg daily except 6 mg Wed.   INR is therapeutic at 2.5. Hgb 9.9, plt 242. LDH 145. No s/sx of bleeding.   Goal of Therapy:  INR 2-2.5 Monitor platelets by anticoagulation protocol: Yes   Plan:  Order warfarin at 4 mg tonight to match PTA regimen Monitor daily INR, CBC, and for s/sx of bleeding  Can plan to resume PTA regimen at discharge with close INR followup  Thank you for allowing pharmacy to participate in this patient's care,  Sherron Monday, PharmD, BCCCP Clinical Pharmacist  Phone: [phone] 10/17/2023 12:46 PM  Please check AMION for all [facility] Pharmacy phone numbers After 10:00 PM, call Main Pharmacy [phone]

## 2023-10-17 NOTE — Progress Notes (Signed)
Peripherally Inserted Central Catheter Placement  The IV Nurse has discussed with the patient and/or persons authorized to consent for the patient, the purpose of this procedure and the potential benefits and risks involved with this procedure.  The benefits include less needle sticks, lab draws from the catheter, and the patient may be discharged home with the catheter. Risks include, but not limited to, infection, bleeding, blood clot (thrombus formation), and puncture of an artery; nerve damage and irregular heartbeat and possibility to perform a PICC exchange if needed/ordered by physician.  Alternatives to this procedure were also discussed.  Bard Power PICC patient education guide, fact sheet on infection prevention and patient information card has been provided to patient /or left at bedside.    PICC Placement Documentation  PICC Single Lumen 10/17/23 Right Brachial 41 cm 0 cm (Active)  Indication for Insertion or Continuance of Line Prolonged intravenous therapies 10/17/23 1049  Exposed Catheter (cm) 0 cm 10/17/23 1049  Site Assessment Clean, Dry, Intact 10/17/23 1049  Line Status Flushed;Saline locked;Blood return noted 10/17/23 1049  Dressing Type Transparent;Securing device 10/17/23 1049  Dressing Status Antimicrobial disc in place;Clean, Dry, Intact 10/17/23 1049  Line Care Connections checked and tightened 10/17/23 1049  Line Adjustment (NICU/IV Team Only) No 10/17/23 1049  Dressing Intervention New dressing;Adhesive placed at insertion site (IV team only);Other (Comment) 10/17/23 1049  Dressing Change Due 10/24/23 10/17/23 1049       Annett Fabian 10/17/2023, 10:50 AM

## 2023-10-18 LAB — CULTURE, BLOOD (ROUTINE X 2)
Culture: NO GROWTH
Culture: NO GROWTH

## 2023-10-20 ENCOUNTER — Telehealth: Payer: Self-pay

## 2023-10-20 NOTE — Telephone Encounter (Signed)
Kevin Underwood with [name] called to inform provider patient has been successfully admitted. Is starting Iv antbx for midline infection. Discharged home over the weekend.  [name], RMA

## 2023-10-20 NOTE — TOC CM/SW Note (Signed)
10/20/2023 Faxed application for [place] Medicaid PCS, for aide to assist in home to [place]. Cathlean Cower CM/Deandra CSW -HF Team Following

## 2023-10-23 ENCOUNTER — Telehealth: Payer: Self-pay | Admitting: Pharmacist

## 2023-10-23 NOTE — Telephone Encounter (Signed)
Received faxed lab results from 11/12 for patient showing WBC has increased to 22.7. Reviewed [name]'s message from 11/11 about possible midline infection. Sending as an Financial planner - results in your box.  Margarite Gouge, PharmD, CPP, BCIDP, AAHIVP Clinical Pharmacist Practitioner Infectious Diseases Clinical Pharmacist [facility]

## 2023-10-27 ENCOUNTER — Telehealth (HOSPITAL_COMMUNITY): Payer: Self-pay | Admitting: Licensed Clinical Social Worker

## 2023-10-27 ENCOUNTER — Encounter (HOSPITAL_COMMUNITY): Payer: 59 | Admitting: Cardiology

## 2023-10-27 NOTE — Telephone Encounter (Signed)
CSW received call from patient's wide to share that they had the [employer] power turned off this afternoon. Patient has an LVAD which is concerning as he needs power for charging batteries and running his power module. Wife plans to go to [employer] to add his U card monies of $290 to the account and asking some family members for assistance. CSW explore some options as well as Patient Care Fund. Wife will follow up with CSW in the morning. Lasandra Beech, LCSW, CCSW-MCS [phone]

## 2023-10-28 ENCOUNTER — Other Ambulatory Visit (HOSPITAL_COMMUNITY): Payer: Self-pay

## 2023-10-28 DIAGNOSIS — Z95811 Presence of heart assist device: Secondary | ICD-10-CM

## 2023-10-28 DIAGNOSIS — Z7901 Long term (current) use of anticoagulants: Secondary | ICD-10-CM

## 2023-10-29 ENCOUNTER — Telehealth (HOSPITAL_COMMUNITY): Payer: Self-pay | Admitting: *Deleted

## 2023-10-29 ENCOUNTER — Encounter (HOSPITAL_COMMUNITY): Payer: 59 | Admitting: Cardiology

## 2023-10-29 ENCOUNTER — Telehealth (HOSPITAL_COMMUNITY): Payer: Self-pay | Admitting: Licensed Clinical Social Worker

## 2023-10-29 ENCOUNTER — Ambulatory Visit (HOSPITAL_COMMUNITY): Admission: RE | Admit: 2023-10-29 | Payer: 59 | Source: Ambulatory Visit

## 2023-10-29 NOTE — Telephone Encounter (Signed)
Received call from patient stating he is unable to come for his appt today because [name] is sick. States he does not feel he can safely drive as he is experiencing intermittent dizziness. States he sustained a fall yesterday in the bathroom, but did not hit his head. Only injury is minor rib soreness. Currently asymptomatic. Appt rescheduled to 11/10/23 at 11:00 with RAMP echo. Pt verbalized understanding.   Pt is currently staying at his mother's house as [employer] turned power off at his home on Monday. Annice Pih CSW is working with pt and his wife regarding this issue. See her notes for details. Pt can be reached at [phone] at this time.   Alyce Pagan RN VAD Coordinator  Office: [phone]  24/7 Pager: [phone]

## 2023-10-29 NOTE — Telephone Encounter (Signed)
CSW contacted patient to follow up on [employer] disconnection. Patient cancelled today's clinic visit as his wife is sick and he is unable to drive to clinic. Patient states he is currently staying at his mothers home and is able to stay there until power restored. Patient plans to contact some friends and community resources to obtain some financial assistance towards the bill. He states he has a U card form his insurance which will cover up $300 on the bill. Patient will keep CSW posted on his progress. Lasandra Beech, LCSW, CCSW-MCS [phone]

## 2023-10-31 ENCOUNTER — Telehealth (HOSPITAL_COMMUNITY): Payer: Self-pay | Admitting: Licensed Clinical Social Worker

## 2023-10-31 ENCOUNTER — Other Ambulatory Visit (HOSPITAL_COMMUNITY): Payer: Self-pay | Admitting: *Deleted

## 2023-10-31 ENCOUNTER — Telehealth (HOSPITAL_COMMUNITY): Payer: Self-pay | Admitting: *Deleted

## 2023-10-31 DIAGNOSIS — Z95811 Presence of heart assist device: Secondary | ICD-10-CM

## 2023-10-31 DIAGNOSIS — T827XXA Infection and inflammatory reaction due to other cardiac and vascular devices, implants and grafts, initial encounter: Secondary | ICD-10-CM

## 2023-10-31 DIAGNOSIS — Z7901 Long term (current) use of anticoagulants: Secondary | ICD-10-CM

## 2023-10-31 MED ORDER — MINOCYCLINE HCL 100 MG PO CAPS: 200.0000 mg | ORAL_CAPSULE | Freq: Two times a day (BID) | ORAL | 3 refills | Status: DC

## 2023-10-31 NOTE — Telephone Encounter (Signed)
Entered in error

## 2023-10-31 NOTE — Telephone Encounter (Signed)
CSW received call form patient's wife stating she has been able to pay $290 on the [employer] account. She states she has another $200 to apply to the account but asking for additional assistance from the Patient Care Fund. CSW explained again that an income statement and signed acknowledgment form would need to be provided by patient in order to assist with assistance. CSW explained this to wife on Monday's phone call and to patient on Wednesday's call. Patient's wife will obtain needed income statement and return call to CSW. Will continue to follow and assist with needs. Lasandra Beech, LCSW, CCSW-MCS [phone]

## 2023-10-31 NOTE — Telephone Encounter (Signed)
Received call from pt's wife stating he is out of PRN Dilaudid. Pt missed his pain clinic appt on 11/20.   Discussed with Dr Shirlee Latch- advised pt needs to reschedule pain clinic appt, and may have PRN Tramadol.   Spoke with [name] regarding the above. PRN Tramadol prescription called in to [facility].  Alyce Pagan RN VAD Coordinator  Office: [phone]  24/7 Pager: [phone]

## 2023-11-01 ENCOUNTER — Telehealth (HOSPITAL_COMMUNITY): Payer: Self-pay | Admitting: Unknown Physician Specialty

## 2023-11-01 MED ORDER — TRAMADOL HCL 50 MG PO TABS: 50.0000 mg | ORAL_TABLET | Freq: Four times a day (QID) | ORAL | 0 refills | Status: DC | PRN

## 2023-11-01 NOTE — Telephone Encounter (Signed)
Received page from pt stating that his insurance is denying the Tramadol 100 mg tabs that were called in yesterday. His insurance will only pay for the 50 mg tablets. This VAD coordinator left msg on provider VM for new Tramadol script for Tramadol 50 mg q 6 prn #30 with 0 refills.  Carlton Adam RN, BSN VAD Coordinator 24/7 Pager [phone]

## 2023-11-03 ENCOUNTER — Telehealth (HOSPITAL_COMMUNITY): Payer: Self-pay | Admitting: Licensed Clinical Social Worker

## 2023-11-03 NOTE — Telephone Encounter (Signed)
H&V Care Navigation CSW Progress Note  Clinical Social Worker met with patient to assist with financial needs related to utilities.  Patient is participating in a Managed Medicaid Plan:  No  Patient and wife came to clinic today as they need financial assistancewith the power bill. Patient and wife state that they were able to use their U card from [employer] and a community resource to get the bill down but still need further financial assistance to have it restored. Patient has a LVAD and requires power for his life sustaining device. Patient brought his income statement as well as wife had a no income letter and they meet the criteria for assistance from the Patient Care Fund. CSW assisted with support and was able to get the power restored this afternoon. Patient and wife grateful for the support and assistance. Lasandra Beech, LCSW, CCSW-MCS [phone]   SDOH Screenings   Food Insecurity: No Food Insecurity (10/09/2023)  Housing: Medium Risk (11/03/2023)  Transportation Needs: No Transportation Needs (10/09/2023)  Recent Concern: Transportation Needs - Unmet Transportation Needs (09/08/2023)  Utilities: At Risk (11/03/2023)  Alcohol Screen: Low Risk  (11/10/2020)  Depression (PHQ2-9): Low Risk  (10/16/2022)  Financial Resource Strain: High Risk (11/03/2023)  Physical Activity: Inactive (11/10/2020)  Tobacco Use: Medium Risk (10/08/2023)

## 2023-11-04 ENCOUNTER — Inpatient Hospital Stay (HOSPITAL_COMMUNITY)
Admission: EM | Admit: 2023-11-04 | Discharge: 2023-11-09 | DRG: 951 | Disposition: E | Payer: 59 | Attending: Cardiology | Admitting: Cardiology

## 2023-11-04 DIAGNOSIS — E119 Type 2 diabetes mellitus without complications: Secondary | ICD-10-CM | POA: Diagnosis present

## 2023-11-04 DIAGNOSIS — Z5982 Transportation insecurity: Secondary | ICD-10-CM

## 2023-11-04 DIAGNOSIS — Z7901 Long term (current) use of anticoagulants: Secondary | ICD-10-CM

## 2023-11-04 DIAGNOSIS — J449 Chronic obstructive pulmonary disease, unspecified: Secondary | ICD-10-CM | POA: Diagnosis present

## 2023-11-04 DIAGNOSIS — Z95811 Presence of heart assist device: Secondary | ICD-10-CM

## 2023-11-04 DIAGNOSIS — Z8249 Family history of ischemic heart disease and other diseases of the circulatory system: Secondary | ICD-10-CM

## 2023-11-04 DIAGNOSIS — K439 Ventral hernia without obstruction or gangrene: Secondary | ICD-10-CM | POA: Diagnosis present

## 2023-11-04 DIAGNOSIS — Y831 Surgical operation with implant of artificial internal device as the cause of abnormal reaction of the patient, or of later complication, without mention of misadventure at the time of the procedure: Secondary | ICD-10-CM | POA: Diagnosis present

## 2023-11-04 DIAGNOSIS — F419 Anxiety disorder, unspecified: Secondary | ICD-10-CM | POA: Diagnosis present

## 2023-11-04 DIAGNOSIS — D735 Infarction of spleen: Secondary | ICD-10-CM | POA: Diagnosis present

## 2023-11-04 DIAGNOSIS — R1012 Left upper quadrant pain: Principal | ICD-10-CM

## 2023-11-04 DIAGNOSIS — R300 Dysuria: Secondary | ICD-10-CM | POA: Diagnosis present

## 2023-11-04 DIAGNOSIS — Z87891 Personal history of nicotine dependence: Secondary | ICD-10-CM

## 2023-11-04 DIAGNOSIS — R0682 Tachypnea, not elsewhere classified: Secondary | ICD-10-CM | POA: Diagnosis not present

## 2023-11-04 DIAGNOSIS — Z7984 Long term (current) use of oral hypoglycemic drugs: Secondary | ICD-10-CM

## 2023-11-04 DIAGNOSIS — I110 Hypertensive heart disease with heart failure: Secondary | ICD-10-CM | POA: Diagnosis present

## 2023-11-04 DIAGNOSIS — D500 Iron deficiency anemia secondary to blood loss (chronic): Secondary | ICD-10-CM | POA: Diagnosis present

## 2023-11-04 DIAGNOSIS — F1511 Other stimulant abuse, in remission: Secondary | ICD-10-CM | POA: Diagnosis present

## 2023-11-04 DIAGNOSIS — Z951 Presence of aortocoronary bypass graft: Secondary | ICD-10-CM

## 2023-11-04 DIAGNOSIS — T827XXA Infection and inflammatory reaction due to other cardiac and vascular devices, implants and grafts, initial encounter: Principal | ICD-10-CM | POA: Diagnosis present

## 2023-11-04 DIAGNOSIS — Z1623 Resistance to quinolones and fluoroquinolones: Secondary | ICD-10-CM | POA: Diagnosis present

## 2023-11-04 DIAGNOSIS — A419 Sepsis, unspecified organism: Secondary | ICD-10-CM | POA: Diagnosis not present

## 2023-11-04 DIAGNOSIS — Z792 Long term (current) use of antibiotics: Secondary | ICD-10-CM

## 2023-11-04 DIAGNOSIS — I339 Acute and subacute endocarditis, unspecified: Secondary | ICD-10-CM | POA: Diagnosis present

## 2023-11-04 DIAGNOSIS — Z79899 Other long term (current) drug therapy: Secondary | ICD-10-CM

## 2023-11-04 DIAGNOSIS — Z555 Less than a high school diploma: Secondary | ICD-10-CM

## 2023-11-04 DIAGNOSIS — G8929 Other chronic pain: Secondary | ICD-10-CM | POA: Diagnosis present

## 2023-11-04 DIAGNOSIS — I5022 Chronic systolic (congestive) heart failure: Secondary | ICD-10-CM | POA: Diagnosis present

## 2023-11-04 DIAGNOSIS — Z825 Family history of asthma and other chronic lower respiratory diseases: Secondary | ICD-10-CM

## 2023-11-04 DIAGNOSIS — Z5986 Financial insecurity: Secondary | ICD-10-CM

## 2023-11-04 DIAGNOSIS — I2510 Atherosclerotic heart disease of native coronary artery without angina pectoris: Secondary | ICD-10-CM | POA: Diagnosis present

## 2023-11-04 DIAGNOSIS — Z833 Family history of diabetes mellitus: Secondary | ICD-10-CM

## 2023-11-04 DIAGNOSIS — B965 Pseudomonas (aeruginosa) (mallei) (pseudomallei) as the cause of diseases classified elsewhere: Secondary | ICD-10-CM | POA: Diagnosis present

## 2023-11-04 DIAGNOSIS — Z66 Do not resuscitate: Secondary | ICD-10-CM | POA: Diagnosis present

## 2023-11-04 DIAGNOSIS — Z515 Encounter for palliative care: Principal | ICD-10-CM

## 2023-11-04 DIAGNOSIS — I480 Paroxysmal atrial fibrillation: Secondary | ICD-10-CM | POA: Diagnosis present

## 2023-11-04 DIAGNOSIS — R5381 Other malaise: Secondary | ICD-10-CM | POA: Diagnosis present

## 2023-11-04 DIAGNOSIS — K429 Umbilical hernia without obstruction or gangrene: Secondary | ICD-10-CM | POA: Diagnosis present

## 2023-11-05 ENCOUNTER — Other Ambulatory Visit: Payer: Self-pay

## 2023-11-05 ENCOUNTER — Emergency Department (HOSPITAL_COMMUNITY): Payer: 59

## 2023-11-05 ENCOUNTER — Encounter (HOSPITAL_COMMUNITY): Payer: Self-pay | Admitting: Cardiology

## 2023-11-05 DIAGNOSIS — E119 Type 2 diabetes mellitus without complications: Secondary | ICD-10-CM | POA: Diagnosis present

## 2023-11-05 DIAGNOSIS — Z515 Encounter for palliative care: Secondary | ICD-10-CM | POA: Diagnosis not present

## 2023-11-05 DIAGNOSIS — Z951 Presence of aortocoronary bypass graft: Secondary | ICD-10-CM | POA: Diagnosis not present

## 2023-11-05 DIAGNOSIS — T827XXA Infection and inflammatory reaction due to other cardiac and vascular devices, implants and grafts, initial encounter: Secondary | ICD-10-CM

## 2023-11-05 DIAGNOSIS — R1012 Left upper quadrant pain: Secondary | ICD-10-CM | POA: Diagnosis not present

## 2023-11-05 DIAGNOSIS — A419 Sepsis, unspecified organism: Secondary | ICD-10-CM | POA: Diagnosis present

## 2023-11-05 DIAGNOSIS — J449 Chronic obstructive pulmonary disease, unspecified: Secondary | ICD-10-CM | POA: Diagnosis present

## 2023-11-05 DIAGNOSIS — F1511 Other stimulant abuse, in remission: Secondary | ICD-10-CM | POA: Diagnosis present

## 2023-11-05 DIAGNOSIS — G8929 Other chronic pain: Secondary | ICD-10-CM | POA: Diagnosis present

## 2023-11-05 DIAGNOSIS — Z7189 Other specified counseling: Secondary | ICD-10-CM

## 2023-11-05 DIAGNOSIS — R5381 Other malaise: Secondary | ICD-10-CM | POA: Diagnosis present

## 2023-11-05 DIAGNOSIS — Z66 Do not resuscitate: Secondary | ICD-10-CM | POA: Diagnosis present

## 2023-11-05 DIAGNOSIS — Z87891 Personal history of nicotine dependence: Secondary | ICD-10-CM | POA: Diagnosis not present

## 2023-11-05 DIAGNOSIS — Z79899 Other long term (current) drug therapy: Secondary | ICD-10-CM | POA: Diagnosis not present

## 2023-11-05 DIAGNOSIS — Z1623 Resistance to quinolones and fluoroquinolones: Secondary | ICD-10-CM | POA: Diagnosis present

## 2023-11-05 DIAGNOSIS — Z95811 Presence of heart assist device: Secondary | ICD-10-CM | POA: Diagnosis not present

## 2023-11-05 DIAGNOSIS — F419 Anxiety disorder, unspecified: Secondary | ICD-10-CM | POA: Diagnosis present

## 2023-11-05 DIAGNOSIS — D735 Infarction of spleen: Secondary | ICD-10-CM | POA: Diagnosis present

## 2023-11-05 DIAGNOSIS — I5022 Chronic systolic (congestive) heart failure: Secondary | ICD-10-CM

## 2023-11-05 DIAGNOSIS — I339 Acute and subacute endocarditis, unspecified: Secondary | ICD-10-CM | POA: Diagnosis present

## 2023-11-05 DIAGNOSIS — D500 Iron deficiency anemia secondary to blood loss (chronic): Secondary | ICD-10-CM | POA: Diagnosis present

## 2023-11-05 DIAGNOSIS — I110 Hypertensive heart disease with heart failure: Secondary | ICD-10-CM | POA: Diagnosis present

## 2023-11-05 DIAGNOSIS — K439 Ventral hernia without obstruction or gangrene: Secondary | ICD-10-CM | POA: Diagnosis present

## 2023-11-05 DIAGNOSIS — I2510 Atherosclerotic heart disease of native coronary artery without angina pectoris: Secondary | ICD-10-CM | POA: Diagnosis present

## 2023-11-05 DIAGNOSIS — B965 Pseudomonas (aeruginosa) (mallei) (pseudomallei) as the cause of diseases classified elsewhere: Secondary | ICD-10-CM | POA: Diagnosis present

## 2023-11-05 DIAGNOSIS — I480 Paroxysmal atrial fibrillation: Secondary | ICD-10-CM | POA: Diagnosis present

## 2023-11-05 DIAGNOSIS — Y831 Surgical operation with implant of artificial internal device as the cause of abnormal reaction of the patient, or of later complication, without mention of misadventure at the time of the procedure: Secondary | ICD-10-CM | POA: Diagnosis present

## 2023-11-05 LAB — URINALYSIS, W/ REFLEX TO CULTURE (INFECTION SUSPECTED)
Bacteria, UA: NONE SEEN
Bilirubin Urine: NEGATIVE
Glucose, UA: NEGATIVE mg/dL
Hgb urine dipstick: NEGATIVE
Ketones, ur: NEGATIVE mg/dL
Leukocytes,Ua: NEGATIVE
Nitrite: NEGATIVE
Protein, ur: NEGATIVE mg/dL
Specific Gravity, Urine: 1.01 (ref 1.005–1.030)
pH: 8 (ref 5.0–8.0)

## 2023-11-05 LAB — CBC WITH DIFFERENTIAL/PLATELET
Abs Immature Granulocytes: 0.1 10*3/uL — ABNORMAL HIGH (ref 0.00–0.07)
Basophils Absolute: 0.1 10*3/uL (ref 0.0–0.1)
Basophils Relative: 0 %
Eosinophils Absolute: 0 10*3/uL (ref 0.0–0.5)
Eosinophils Relative: 0 %
HCT: 24.5 % — ABNORMAL LOW (ref 39.0–52.0)
Hemoglobin: 7.9 g/dL — ABNORMAL LOW (ref 13.0–17.0)
Immature Granulocytes: 1 %
Lymphocytes Relative: 9 %
Lymphs Abs: 1.3 10*3/uL (ref 0.7–4.0)
MCH: 24.6 pg — ABNORMAL LOW (ref 26.0–34.0)
MCHC: 32.2 g/dL (ref 30.0–36.0)
MCV: 76.3 fL — ABNORMAL LOW (ref 80.0–100.0)
Monocytes Absolute: 0.9 10*3/uL (ref 0.1–1.0)
Monocytes Relative: 7 %
Neutro Abs: 11.8 10*3/uL — ABNORMAL HIGH (ref 1.7–7.7)
Neutrophils Relative %: 83 %
Platelets: 252 10*3/uL (ref 150–400)
RBC: 3.21 MIL/uL — ABNORMAL LOW (ref 4.22–5.81)
RDW: 16 % — ABNORMAL HIGH (ref 11.5–15.5)
WBC: 14.1 10*3/uL — ABNORMAL HIGH (ref 4.0–10.5)
nRBC: 0 % (ref 0.0–0.2)

## 2023-11-05 LAB — RESP PANEL BY RT-PCR (RSV, FLU A&B, COVID)  RVPGX2
Influenza A by PCR: NEGATIVE
Influenza B by PCR: NEGATIVE
Resp Syncytial Virus by PCR: NEGATIVE
SARS Coronavirus 2 by RT PCR: NEGATIVE

## 2023-11-05 LAB — COMPREHENSIVE METABOLIC PANEL
ALT: 25 U/L (ref 0–44)
AST: 30 U/L (ref 15–41)
Albumin: 2.1 g/dL — ABNORMAL LOW (ref 3.5–5.0)
Alkaline Phosphatase: 125 U/L (ref 38–126)
Anion gap: 9 (ref 5–15)
BUN: 13 mg/dL (ref 8–23)
CO2: 20 mmol/L — ABNORMAL LOW (ref 22–32)
Calcium: 8 mg/dL — ABNORMAL LOW (ref 8.9–10.3)
Chloride: 101 mmol/L (ref 98–111)
Creatinine, Ser: 0.9 mg/dL (ref 0.61–1.24)
GFR, Estimated: >60 mL/min (ref >60)
Glucose, Bld: 109 mg/dL — ABNORMAL HIGH (ref 70–99)
Potassium: 3.4 mmol/L — ABNORMAL LOW (ref 3.5–5.1)
Sodium: 130 mmol/L — ABNORMAL LOW (ref 135–145)
Total Bilirubin: 0.6 mg/dL (ref <1.2)
Total Protein: 5.8 g/dL — ABNORMAL LOW (ref 6.5–8.1)

## 2023-11-05 LAB — I-STAT CHEM 8, ED
BUN: 12 mg/dL (ref 8–23)
Calcium, Ion: 1.14 mmol/L — ABNORMAL LOW (ref 1.15–1.40)
Chloride: 102 mmol/L (ref 98–111)
Creatinine, Ser: 1 mg/dL (ref 0.61–1.24)
Glucose, Bld: 104 mg/dL — ABNORMAL HIGH (ref 70–99)
HCT: 24 % — ABNORMAL LOW (ref 39.0–52.0)
Hemoglobin: 8.2 g/dL — ABNORMAL LOW (ref 13.0–17.0)
Potassium: 3.5 mmol/L (ref 3.5–5.1)
Sodium: 136 mmol/L (ref 135–145)
TCO2: 21 mmol/L — ABNORMAL LOW (ref 22–32)

## 2023-11-05 LAB — PROTIME-INR
INR: 2 — ABNORMAL HIGH (ref 0.8–1.2)
Prothrombin Time: 22.7 s — ABNORMAL HIGH (ref 11.4–15.2)

## 2023-11-05 LAB — APTT: aPTT: 62 s — ABNORMAL HIGH (ref 24–36)

## 2023-11-05 LAB — I-STAT CG4 LACTIC ACID, ED
Lactic Acid, Venous: 1.6 mmol/L (ref 0.5–1.9)
Lactic Acid, Venous: 1.9 mmol/L (ref 0.5–1.9)

## 2023-11-05 LAB — TYPE AND SCREEN
ABO/RH(D): B POS
Antibody Screen: NEGATIVE

## 2023-11-05 LAB — LIPASE, BLOOD: Lipase: 56 U/L — ABNORMAL HIGH (ref 11–51)

## 2023-11-05 LAB — LACTATE DEHYDROGENASE: LDH: 135 U/L (ref 98–192)

## 2023-11-05 MED ORDER — HYDROMORPHONE HCL 1 MG/ML IJ SOLN
1.0000 mg | INTRAMUSCULAR | Status: DC | PRN
  Administered 2023-11-05: 1 mg via INTRAVENOUS
  Filled 2023-11-05: qty 1

## 2023-11-05 MED ORDER — UMECLIDINIUM BROMIDE 62.5 MCG/ACT IN AEPB: 1.0000 | INHALATION_SPRAY | Freq: Every day | RESPIRATORY_TRACT | Status: DC

## 2023-11-05 MED ORDER — GLYCOPYRROLATE 1 MG PO TABS: 1.0000 mg | ORAL_TABLET | ORAL | Status: DC | PRN

## 2023-11-05 MED ORDER — BIOTENE DRY MOUTH MT LIQD: 15.0000 mL | OROMUCOSAL | Status: DC | PRN

## 2023-11-05 MED ORDER — ROSUVASTATIN CALCIUM 20 MG PO TABS: 20.0000 mg | ORAL_TABLET | Freq: Every day | ORAL | Status: DC

## 2023-11-05 MED ORDER — HALOPERIDOL LACTATE 2 MG/ML PO CONC: 0.5000 mg | ORAL | Status: DC | PRN

## 2023-11-05 MED ORDER — IOHEXOL 350 MG/ML SOLN
75.0000 mL | Freq: Once | INTRAVENOUS | Status: AC | PRN
  Administered 2023-11-05: 75 mL via INTRAVENOUS

## 2023-11-05 MED ORDER — FLUTICASONE FUROATE-VILANTEROL 100-25 MCG/ACT IN AEPB: 1.0000 | INHALATION_SPRAY | Freq: Every day | RESPIRATORY_TRACT | Status: DC

## 2023-11-05 MED ORDER — HYDROMORPHONE HCL 1 MG/ML IJ SOLN
0.5000 mg | INTRAMUSCULAR | Status: DC | PRN
  Administered 2023-11-05: 0.5 mg via INTRAVENOUS
  Filled 2023-11-05: qty 1

## 2023-11-05 MED ORDER — HALOPERIDOL 0.5 MG PO TABS: 0.5000 mg | ORAL_TABLET | ORAL | Status: DC | PRN

## 2023-11-05 MED ORDER — ONDANSETRON HCL 4 MG/2ML IJ SOLN
4.0000 mg | Freq: Once | INTRAMUSCULAR | Status: AC
  Administered 2023-11-05: 4 mg via INTRAVENOUS
  Filled 2023-11-05: qty 2

## 2023-11-05 MED ORDER — IPRATROPIUM-ALBUTEROL 20-100 MCG/ACT IN AERS: 1.0000 | INHALATION_SPRAY | Freq: Four times a day (QID) | RESPIRATORY_TRACT | Status: DC

## 2023-11-05 MED ORDER — FENTANYL CITRATE PF 50 MCG/ML IJ SOSY
50.0000 ug | PREFILLED_SYRINGE | Freq: Once | INTRAMUSCULAR | Status: AC
Start: 2023-11-05 — End: 2023-11-05
  Administered 2023-11-05: 50 ug via INTRAVENOUS
  Filled 2023-11-05: qty 1

## 2023-11-05 MED ORDER — AMLODIPINE BESYLATE 5 MG PO TABS: 10.0000 mg | ORAL_TABLET | Freq: Every day | ORAL | Status: DC

## 2023-11-05 MED ORDER — SODIUM CHLORIDE 0.9 % IV SOLN
1.0000 g | Freq: Once | INTRAVENOUS | Status: AC
  Administered 2023-11-05: 1 g via INTRAVENOUS
  Filled 2023-11-05: qty 20

## 2023-11-05 MED ORDER — PANTOPRAZOLE SODIUM 40 MG PO TBEC: 40.0000 mg | DELAYED_RELEASE_TABLET | Freq: Every day | ORAL | Status: DC

## 2023-11-05 MED ORDER — ORAL CARE MOUTH RINSE: 15.0000 mL | OROMUCOSAL | Status: DC | PRN

## 2023-11-05 MED ORDER — ALBUTEROL SULFATE HFA 108 (90 BASE) MCG/ACT IN AERS: 2.0000 | INHALATION_SPRAY | Freq: Four times a day (QID) | RESPIRATORY_TRACT | Status: DC | PRN

## 2023-11-05 MED ORDER — MINOCYCLINE HCL 50 MG PO CAPS
200.0000 mg | ORAL_CAPSULE | Freq: Two times a day (BID) | ORAL | Status: DC
  Filled 2023-11-05: qty 4
  Filled 2023-11-05: qty 2

## 2023-11-05 MED ORDER — POLYVINYL ALCOHOL 1.4 % OP SOLN: 1.0000 [drp] | Freq: Four times a day (QID) | OPHTHALMIC | Status: DC | PRN

## 2023-11-05 MED ORDER — SERTRALINE HCL 50 MG PO TABS: 50.0000 mg | ORAL_TABLET | Freq: Every day | ORAL | Status: DC

## 2023-11-05 MED ORDER — GLYCOPYRROLATE 0.2 MG/ML IJ SOLN: 0.2000 mg | INTRAMUSCULAR | Status: DC | PRN

## 2023-11-05 MED ORDER — VANCOMYCIN HCL IN DEXTROSE 1-5 GM/200ML-% IV SOLN
1000.0000 mg | Freq: Once | INTRAVENOUS | Status: AC
  Administered 2023-11-05: 1000 mg via INTRAVENOUS
  Filled 2023-11-05: qty 200

## 2023-11-05 MED ORDER — FENTANYL CITRATE PF 50 MCG/ML IJ SOSY
50.0000 ug | PREFILLED_SYRINGE | Freq: Once | INTRAMUSCULAR | Status: AC
  Administered 2023-11-05: 50 ug via INTRAVENOUS
  Filled 2023-11-05: qty 1

## 2023-11-05 MED ORDER — DIPHENHYDRAMINE HCL 12.5 MG/5ML PO ELIX: 12.5000 mg | ORAL_SOLUTION | Freq: Four times a day (QID) | ORAL | Status: DC | PRN

## 2023-11-05 MED ORDER — HYDROMORPHONE 1 MG/ML IV SOLN: INTRAVENOUS | Status: DC

## 2023-11-05 MED ORDER — SODIUM CHLORIDE 0.9 % IV SOLN
1.0000 g | Freq: Three times a day (TID) | INTRAVENOUS | Status: DC
  Filled 2023-11-05: qty 20

## 2023-11-05 MED ORDER — ZINC GLUCONATE 50 MG PO TABS: 50.0000 mg | ORAL_TABLET | Freq: Every day | ORAL | Status: DC

## 2023-11-05 MED ORDER — ACETAMINOPHEN 325 MG PO TABS: 650.0000 mg | ORAL_TABLET | ORAL | Status: DC | PRN

## 2023-11-05 MED ORDER — HYDROMORPHONE HCL 1 MG/ML IJ SOLN
2.0000 mg | Freq: Once | INTRAMUSCULAR | Status: AC
  Administered 2023-11-05: 2 mg via INTRAVENOUS
  Filled 2023-11-05: qty 2

## 2023-11-05 MED ORDER — ZINC SULFATE 220 (50 ZN) MG PO CAPS: 220.0000 mg | ORAL_CAPSULE | Freq: Every day | ORAL | Status: DC

## 2023-11-05 MED ORDER — ONDANSETRON HCL 4 MG/2ML IJ SOLN: 4.0000 mg | Freq: Four times a day (QID) | INTRAMUSCULAR | Status: DC | PRN

## 2023-11-05 MED ORDER — TAMSULOSIN HCL 0.4 MG PO CAPS: 0.4000 mg | ORAL_CAPSULE | Freq: Every day | ORAL | Status: DC

## 2023-11-05 MED ORDER — DIPHENHYDRAMINE HCL 50 MG/ML IJ SOLN: 12.5000 mg | Freq: Four times a day (QID) | INTRAMUSCULAR | Status: DC | PRN

## 2023-11-05 MED ORDER — TRAMADOL HCL 50 MG PO TABS
50.0000 mg | ORAL_TABLET | Freq: Four times a day (QID) | ORAL | Status: DC | PRN
  Administered 2023-11-07 – 2023-11-08 (×2): 50 mg via ORAL
  Filled 2023-11-05 (×2): qty 1

## 2023-11-05 MED ORDER — HYDROMORPHONE 1 MG/ML IV SOLN
INTRAVENOUS | Status: DC
  Administered 2023-11-06: 1 mg via INTRAVENOUS
  Administered 2023-11-07: 9.5 mg via INTRAVENOUS
  Administered 2023-11-07: 5.65 mg via INTRAVENOUS
  Administered 2023-11-08: 4.74 mg via INTRAVENOUS
  Administered 2023-11-08: 30 mg via INTRAVENOUS
  Administered 2023-11-08: 6.47 mg via INTRAVENOUS
  Filled 2023-11-05 (×3): qty 30

## 2023-11-05 MED ORDER — HALOPERIDOL LACTATE 5 MG/ML IJ SOLN: 0.5000 mg | INTRAMUSCULAR | Status: DC | PRN

## 2023-11-05 NOTE — Consult Note (Signed)
Palliative Care Consult Note                                  Date: 11/05/2023   Patient Name: Kevin Underwood.  DOB: 17-Sep-1961  MRN: [id number]  Age / Sex: 62 y.o., male  PCP: Kevin Pop, FNP Referring Physician: Dorthula Nettles, DO  Reason for Consultation: Establishing goals of care, Terminal Care, and Withdrawal of life-sustaining treatment  HPI/Patient Profile: 62 y.o. male  with past medical history of systolic HF, 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 type 2 diabetes mellitus, COPD, and hypertension. 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 He has a history of chronic driveline infections and stated poor quality of life for the past several months.  Presented to the ED with worsening abdominal pain at driveline and hernia sites, cough, fever.  He was admitted on 11/04/2023 with possible sepsis, recurrent driveline infection, heart failure, and others.  His primary justification for admission is for pain management and comfort care at end-of-life including discontinuation of LVAD.  Palliative medicine was consulted for symptom management and end-of-life care.  Past Medical History:  Diagnosis Date   "    Arthritis    CAD (coronary artery disease)    a. s/p CABG in 11/2019 with LIMA-LAD, SVG-OM1, SVG-PDA and SVG-D1   CHF (congestive heart failure) (HCC)    a. EF < 20% by echo in 11/2019   Essential hypertension    PAF (paroxysmal atrial fibrillation) (HCC)    Type 2 diabetes mellitus (HCC)     Subjective:   This NP Kevin Underwood reviewed medical records, received report from team, assessed the patient and then meet at the patient's bedside to discuss diagnosis, prognosis, GOC, EOL wishes disposition and options.  I met with the patient at the bedside.  Also present was  his wife Kevin Underwood.   We meet to discuss diagnosis prognosis, GOC, EOL wishes, disposition and options. Concept of Palliative Care was introduced as specialized medical care for people and their families living with serious illness.  If focuses on providing relief from the symptoms and stress of a serious illness.  The goal is to improve quality of life for both the patient and the family. Values and goals of care important to patient and family were attempted to be elicited.  Created space and opportunity for patient  and family to explore thoughts and feelings regarding current medical situation   Natural trajectory and current clinical status were discussed. Questions and concerns addressed. Patient  encouraged to call with questions or concerns.    Patient/Family Understanding of Illness: The patient had an LVAD placed in December 2021.  He has a very good understanding of his health and current situation.  He describes recurrent infections, sepsis, complications of recurrent driveline infection.  He also notes that abdominal hernia.  He states his pain has been more difficult to manage.  His infection is getting worse" there is nothing they can do for it anymore."  Life Review: He has been married for 45 years.  He states "I was not a good person for most of it but she is hung in there for me."  He has 4 children, 3 sons and a daughter.  Children are aged 52, 69, 71, 73.  1 son lives in  [place] and the rest lives locally.  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  He shares that his vice and life was drinking and drugs.  He states he missed a lot of life and he knows that he cannot change it but he is hoping for forgiveness from his children and wife.  He enjoys being outdoors, describes this is why he loves locking so much.  He is a [place] and has requested a preacher/chaplain.  Patient  Values: Family, seeking forgiveness  Goals: Comfort care, pain management until decision to discontinue LVAD  Today's Discussion: In addition to discussions described above he has since discussion of various topics.  His wife was present at the bedside and was tearful.  We spent some time sharing stories about his life.  He shares that he is thankful for the year to year and a half of good quality time he was gifted because of the LVAD.  However, he notes over the previous several months his pain and quality of life have gotten progressively worse.  He states there is not anything I can do for him anymore.  At this time he is tired of suffering and would like to discuss comfort care.  I explained comfort care as care where the patient would no longer receive aggressive medical interventions such as continuous vital signs, lab work, radiology testing, or medications not focused on comfort. All care would focus on how the patient is looking and feeling. This would include management of any symptoms that may cause discomfort, pain, shortness of breath, cough, nausea, agitation, anxiety, and/or secretions etc. Symptoms would be managed with medications and other non-pharmacological interventions such as spiritual support if requested, repositioning, music therapy, or therapeutic listening. Family verbalized understanding and appreciation.   We talked about the option for hospice care including residential hospice. I described hospice as a service for patients who have a life expectancy of 6 months or less. The goal of hospice is the preservation of dignity and quality at the end phases of life. Under hospice care, the focus changes from curative to symptom relief. I explained the three setting where hospice services can be provided including the home, at a living facility (such as LTC SNF, Assisted Living, etc), and a hospice facility. I explained that acceptance to hospice in any specific location is the  final decision of the hospice medical director and bed availability, if applicable. They verbalized understanding.  At this time they are electing hospital-based comfort care with in-hospital death.  They have declined hospice services.  I spent time discussing medications we can use for comfort care.  Will plan to start him on a PCA with Dilaudid.  I shared how this will allow him to control both pain control as well as his level of sedation due to pain medication.  I explained the pain medicine may make him sleepy.  I understand he has a goal to speak with his children and spent time with family as much as possible.  I discussed that we can make changes to pain medicine dosing if the dosing in place is not effective enough.  LVAD coordinator was also present and shared that when the patient is ready he can notify them and they would come to discontinue the LVAD.  He is aware that at this time this would mean his life would end pretty quickly.  I shared that our team would come see him at least once a day and are available at any time related to comfort needs. I provided emotional and general support through therapeutic listening, empathy, sharing of stories, therapeutic touch, and other techniques. I answered all questions and addressed all concerns to the best of my ability.  Review of Systems  Respiratory:  Negative for cough and shortness of breath.   Gastrointestinal:  Positive for abdominal pain. Negative for nausea and vomiting.    Objective:   Primary Diagnoses: Present on Admission:  Sepsis [facility])   Physical Exam Vitals and nursing note reviewed.  Constitutional:      General: He is in acute distress (Significant pain).     Appearance: He is ill-appearing.  HENT:     Head: Normocephalic and atraumatic.  Cardiovascular:     Rate and Rhythm: Tachycardia present.  Pulmonary:     Effort: Pulmonary effort is normal. No respiratory distress.  Skin:    General: Skin is warm and dry.   Neurological:     General: No focal deficit present.     Mental Status: He is alert.  Psychiatric:        Mood and Affect: Affect is tearful.     Vital Signs:  BP 99/74 (BP Location: Left Arm)   Pulse 89   Temp 98 F (36.7 C) (Oral)   Resp 20   Ht 5\' 10"  (1.778 m)   Wt 85.3 kg   SpO2 99%   BMI 26.98 kg/m   Palliative Assessment/Data: 30%    Advanced Care Planning:   Existing Vynca/ACP Documentation: Advanced directive signed 11/15/2020  Primary Decision Maker: PATIENT  Code Status/Advance Care Planning: DNR-comfort  A discussion was had today regarding advanced directives. Concepts specific to code status, artifical feeding and hydration, continued IV antibiotics and rehospitalization was had.  The difference between a aggressive medical intervention path and a palliative comfort care path for this patient at this time was had.   Decisions/Changes to ACP: None today  Assessment & Plan:   Impression: 62 year old male with acute presentation chronic comorbidities as described above.  The patient is an LVAD patient has been in and out of the hospital in recent months with persistent and intractable driveline infection.  It appears the infection is ascending, possible endocarditis and splenic infarct related to this.  Because of persistent pain over the past several months, substantially declining quality of life in recent months, the patient has elected to transition to comfort care.  He is electing in-hospital death after that time to spend time with his family over the next 1 to 2 days.  Overall prognosis is grave.  SUMMARY OF RECOMMENDATIONS   DNR-comfort Spiritual care consult for chaplaincy support Comfort care See symptom management orders below Continue emotional support of patient and family Palliative medicine will see the patient daily for symptom management needs  Symptom Management:  Tylenol 650 mg p.o. every 4 hours as needed headache/mild  pain Benadryl 12.5 mg IV every 6 hours as needed itching Robinul 0.2 mg IV every 4 hours as needed excessive secretions Haldol 0.5 mg IV every 4 hours as needed agitation or delirium Zofran 4 mg IV every 6 hours as needed nausea or vomiting Ultram 50 mg p.o. every 6 hours as needed severe pain Polyvinyl alcohol 1.4% ophthalmic 1 drop OU 4 times daily as needed dry eyes Dilaudid PCA: No loading dose, 1 mg/h basal rate, 0.5 mg every 10 minutes bolus availability, total hourly maximum  dose 4 mg  Prognosis:  Hours - Days  Discharge Planning:  Anticipated Hospital Death   Discussed with: Patient, family, medical team, nursing team, VAD team    Thank you for allowing [place] to participate in the care of Xan Jasso. PMT will continue to support holistically.  Time Total: 75 min  Detailed review of medical records (labs, imaging, vital signs), medically appropriate exam, discussed with treatment team, counseling and education to patient, family, & staff, documenting clinical information, medication management, coordination of care  Signed by: Kevin Dust, NP Palliative Medicine Team  Team Phone # [phone] (Nights/Weekends)  11/05/2023, 12:51 PM

## 2023-11-05 NOTE — ED Notes (Signed)
XR at bedside

## 2023-11-05 NOTE — ED Triage Notes (Signed)
Pt BIB [facility]. 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 Presents with acute worsening of chronic abdominal pain that started today. Warm to touch on arrival. 100 mcg fent given by EMS in route - no improvement of pain. Pt also has ongoing productive cough x 1 week.

## 2023-11-05 NOTE — Progress Notes (Signed)
LVAD Coordinator Rounding Note:  Pt presented to [facility] overnight due to abdominal pain and shortness of breath.  LVAD is a HM 3 and was implanted on 11/17/20 by Dr Vickey Sages for destination therapy.    Pt has chronic drive line infection. Recently hospitalized with bacteriemia and was discharged from the hospital 11/8 with PICC line- Meropenem 1 g q 8 hrs and PO Minocycline (chronic suppression).    VAD Coordinator on call received page from pt's wife overnight reporting pt experiencing increased abdominal pain, shortness of breath with productive cough (white sputum), and low grade fever. Pt experiences chronic abdominal pain (large ventral hernia/chronic driveline infection). He states pain is acutely worse than baseline. Pt has been managing pain since end of last week with Tramadol.   BC + for pseudomonas 10/31 & 11/4.  He has been drug free and complaint in the past few months. Pt has been compliant with antibiotic regimen of PO Minocycline 200 mg BID and Meropenem 1 gm every 8 hrs for 6 weeks.   Palliative consulted last admission but pt refused. Pt now stating he is ready for hospice. Extensive conversation held with AHF providers and social work about comfort care with withdrawal of VAD when pt is ready. Pt agreeable for admission for comfort care and verbalizes understanding. Pt wishes for his family to come see him prior to withdrawal of care. Palliative consulted. Dilaudid PCA ordered by AHF team.   Vital signs: Temp: 98 HR: 86 Doppler Pressure: none documented Automatic BP: 99/74 (81) O2 Sat: 99% on RA Wt: 188lbs  LVAD interrogation reveals:  Speed: 5600 Flow: 4 Power: 4 w PI: 3.1  Alarms: none Events: 100+ PI events today Hct: 32  Fixed speed:  5600 Low speed limit:  5300  Drive Line: Pt comfort care. VAD Coordinator discussed that we will focus solely on comfort and if pt or pt's family requests dressing change we will be happy to perform. Bedside nurse made aware.    Labs: Comfort Care  Anticoagulation Plan: -INR Goal: 2.0 - 2.5 -ASA Dose: none  ICD: N/A  Infection:  11/05/23:Blood cultures>>NGTD  Drips:  Dilaudid PCA  Plan/Recommendations:  Page VAD coordinator with equipment issues or driveline problems  Simmie Davies RN,BSN VAD Coordinator  Office: [phone]  24/7 Pager: [phone]

## 2023-11-05 NOTE — Progress Notes (Addendum)
Pharmacy Antibiotic Note  Kevin Pase. is a 62 y.o. male admitted on 11/04/2023 with bacteremia.  Pharmacy has been consulted for meropenem dosing.  Presented with worsening abdominal pain, productive cough, and low grade fever. Received 1 dose of vancomycin and meropenem this AM -on abx PTA for known pseudomonas LVAD driveline infection (plan for meropenem until 12/16 with long term minocycline). WBC 14.1, afebrile. Tax 100.4> now afebrile.   Plan: Meropenem 1g IV every 8 hours Continue minocycline 200 mg BID for suppressive therapy Monitor cx results, clinical pic, and imaging  Height: 5\' 10"  (177.8 cm) Weight: 85.3 kg (188 lb) IBW/kg (Calculated) : 73  Temp (24hrs), Avg:99.7 F (37.6 C), Min:98.9 F (37.2 C), Max:100.4 F (38 C)  Recent Labs  Lab 11/05/23 0021 11/05/23 0029 11/05/23 0031 11/05/23 0226  WBC 14.1*  --   --   --   CREATININE 0.90 1.00  --   --   LATICACIDVEN  --   --  1.9 1.6    Estimated Creatinine Clearance: 79.1 mL/min (by C-G formula based on SCr of 1 mg/dL).    Allergies  Allergen Reactions   Firvanq [Vancomycin] Nausea And Vomiting and Other (See Comments)    Arthralgias     Antimicrobials this admission: Vancomycin 11/27 x1 Meropenem 11/27 >>  Minocycline 11/27 >>  Dose adjustments this admission: N/A  Microbiology results: Bcx 10/31: 2/4 GNR - BCID PSA ( S gent/imipenem) Bcx 11/4: ngtd Bcx 11/27: ngtd   Thank you for allowing pharmacy to participate in this patient's care,  Sherron Monday, PharmD, BCCCP Clinical Pharmacist  Phone: [phone] 11/05/2023 8:52 AM  Please check AMION for all [facility] Pharm[facility] phone numbers After 10:00 PM, call Main Pharmacy [phone]

## 2023-11-05 NOTE — TOC Initial Note (Addendum)
Transition of Care (TOC) - Initial/Assessment Note    Patient Details  Name: Kevin Underwood. MRN: [id number] Date of Birth: 1961/04/24  Transition of Care [facility]) CM/SW Contact:    [name], RN Phone Number: [phone] 11/05/2023, 11:14 AM  Clinical Narrative:      CM contacted [facility], [facility]. Their facility cannot accept LVAD patients.  Contacted [facility], spoke to Francis. She will give CM a call back.        Received call back and states it is case by case.  Plan is for comfort care.         Expected Discharge Plan: Hospice Medical Facility Barriers to Discharge: Continued Medical Work up   Patient Goals and CMS Choice   CMS Medicare.gov Compare Post Acute Care list provided to:: Patient Choice offered to / list presented to : Patient      Expected Discharge Plan and Services   Discharge Planning Services: CM Consult Post Acute Care Choice: [facility] Bed Living arrangements for the past 2 months: Single Family Home                                      Prior Living Arrangements/Services Living arrangements for the past 2 months: Single Family Home Lives with:: Spouse          Need for Family Participation in Patient Care: Yes (Comment) Care giver support system in place?: Yes (comment) Current home services: DME (LVAD, scale, nebulizer)    Activities of Daily Living      Permission Sought/Granted Permission sought to share information with : Case Manager, Family Supports Permission granted to share information with : Yes, Verbal Permission Granted  Share Information with NAME: Arin Tapscott  Permission granted to share info w AGENCY: [facility]  Permission granted to share info w Relationship: wife  Permission granted to share info w Contact Information: [phone]  Emotional Assessment              Admission diagnosis:  Sepsis (HCC) [A41.9] Patient  Active Problem List   Diagnosis Date Noted   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) 10/09/2023   Protein-calorie malnutrition, severe 10/02/2023   Abdominal pain 09/23/2023   Sepsis (HCC) 09/08/2023   Stimulant use disorder 02/09/2023   Depression 02/09/2023   Pain and swelling of right wrist 09/30/2022   Hypotension 08/07/2022   MRSA infection    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 (LVAD) (HCC)    Left ventricular assist device (LVAD) complication, subsequent encounter 05/13/2022   AKI (acute kidney injury) (HCC) 05/13/2022   Hyperthyroidism 01/22/2022   Chronic systolic heart failure (HCC)    COPD (chronic obstructive pulmonary disease) (HCC) 09/09/2021   Malnutrition of moderate degree 11/17/2020   CHF (congestive heart failure), NYHA class IV, chronic, systolic (HCC) 11/17/2020   Palliative care by specialist    Goals of care, counseling/discussion    Hyperlipidemia    CHF exacerbation (HCC) 09/23/2020   Unstable angina (HCC) 09/22/2020   LVAD (left ventricular assist device) present (HCC)    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   Cardiogenic shock (HCC)    Acute respiratory failure (HCC)    RUQ abdominal pain    Cardiomyopathy (HCC) 11/21/2019   Acute systolic CHF (congestive heart failure) (HCC) 11/21/2019   History of alcohol  abuse Quit 6 mos ago 11/21/2019   History of recreational drug use 11/21/2019   PAF (paroxysmal atrial fibrillation) (HCC) 11/21/2019   Cardiac volume overload 11/21/2019   Elevated brain natriuretic peptide (BNP) level 11/21/2019   Leg edema, right 11/21/2019   Hyponatremia 11/21/2019   Abnormal electrocardiogram (ECG) (EKG) 11/21/2019   Hyperglycemia 11/21/2019   Tremulousness 11/21/2019   Restlessness and agitation 11/21/2019   OSA (obstructive sleep apnea) 11/21/2019   Atrial fibrillation with RVR (HCC) 11/20/2019   Acute congestive heart failure (HCC) 11/20/2019   Acute respiratory  distress 11/20/2019   COPD with acute exacerbation (HCC)    Hypertension    Type 2 diabetes mellitus (HCC)    Closed fracture of 5th metacarpal 08/31/2012   PCP:  [name], FNP Pharmacy:   [facility] [phone] - [place], [place] - [address] [facility] [address] [place] [address] Phone: [phone] Fax: [phone]  [id number] [place], [place] - [address] [place] [address] Phone: [phone] Fax: [phone]  [facility] [address] [place] [address] Phone: [phone] Fax: [phone]     Social Determinants of Health (SDOH) Social History: SDOH Screenings   Food Insecurity: No Food Insecurity (10/09/2023)  Housing: Medium Risk (11/03/2023)  Transportation Needs: No Transportation Needs (10/09/2023)  Recent Concern: Transportation Needs - Unmet Transportation Needs (09/08/2023)  Utilities: At Risk (11/03/2023)  Alcohol Screen: Low Risk  (11/10/2020)  Depression (PHQ2-9): Low Risk  (10/16/2022)  Financial Resource Strain: High Risk (11/03/2023)  Physical Activity: Inactive (11/10/2020)  Tobacco Use: Medium Risk (10/08/2023)   SDOH Interventions:     Readmission Risk Interventions    07/01/2022    9:05 AM  Readmission Risk Prevention Plan  Transportation Screening Complete  Medication Review (RN Care Manager) Complete  PCP or Specialist appointment within 3-5 days of discharge Complete  HRI or Home Care Consult Complete  SW Recovery Care/Counseling Consult Complete  Palliative Care Screening Not Applicable  Skilled Nursing Facility Not Applicable

## 2023-11-05 NOTE — Progress Notes (Addendum)
ED Pharmacy Antibiotic Sign Off An antibiotic consult was received from an ED provider for merrem and vancomycin per pharmacy dosing for sepsis/history of drive-line infection. LVAD implanted on 12/10.21 A chart review was completed to assess appropriateness.   Patient on merrem 1g IV q8h PTA until 11/24/23 (last dose given ~2100 on 11/26) and minocycline 200mg  PO BID   The following one time order(s) were placed:  -Merrem 1g IV x1, given patient has critical illness/septic will give patient ~2g for tonight's dose -Vancomycin 2g IV x1  Further antibiotic and/or antibiotic pharmacy consults should be ordered by the admitting provider if indicated.   Thank you for allowing pharmacy to be a part of this patient's care.   Arabella Merles, [facility]  Clinical Pharmacist 11/05/23 1:21 AM

## 2023-11-05 NOTE — ED Notes (Signed)
ED TO INPATIENT HANDOFF REPORT  ED Nurse Name and Phone #:   S Name/Age/Gender Kevin Underwood 62 y.o. male Room/Bed: RESUSC/RESUSC  Code Status   Code Status: Partial Code  Home/SNF/Other Home Patient oriented to: self, place, time, and situation Is this baseline? Yes   Triage Complete: Triage complete  Chief Complaint Sepsis [facility]) [A41.9]  Triage Note Pt BIB [facility]. 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 Presents with acute worsening of chronic abdominal pain that started today. Warm to touch on arrival. 100 mcg fent given by EMS in route - no improvement of pain. Pt also has ongoing productive cough x 1 week.    Allergies Allergies  Allergen Reactions   Firvanq [Vancomycin] Nausea And Vomiting and Other (See Comments)    Arthralgias     Level of Care/Admitting Diagnosis ED Disposition     ED Disposition  Admit   Condition  --   Comment  Hospital Area: [facility] [[id number]]  Level of Care: Progressive [102]  Admit to Progressive based on following criteria: CARDIOVASCULAR & THORACIC of moderate stability with acute coronary syndrome symptoms/low risk myocardial infarction/hypertensive urgency/arrhythmias/heart failure potentially compromising stability and stable post cardiovascular intervention patients.  May admit patient to [facility] or [facility] if equivalent level of care is available:: No  Covid Evaluation: Asymptomatic - no recent exposure (last 10 days) testing not required  Diagnosis: Sepsis [facility]) [[id number]]  Admitting Physician: Dorthula Nettles [[id number]]  Attending Physician: Dorthula Nettles [phone]  Bed request comments: 2C  Certification:: I certify this patient will need inpatient services for at least 2 midnights  Expected Medical Readiness: 11/12/2023          B Medical/Surgery History Past Medical History:  Diagnosis Date   "    Arthritis    CAD (coronary artery disease)     a. s/p CABG in 11/2019 with LIMA-LAD, SVG-OM1, SVG-PDA and SVG-D1   CHF (congestive heart failure) (HCC)    a. EF < 20% by echo in 11/2019   Essential hypertension    PAF (paroxysmal atrial fibrillation) (HCC)    Type 2 diabetes mellitus (HCC)    Past Surgical History:  Procedure Laterality Date   APPLICATION OF WOUND VAC Left 06/14/2022   Procedure: APPLICATION OF WOUND VAC;  Surgeon: Lovett Sox, MD;  [facility] OR;  Service: Thoracic;  Laterality: Left;   APPLICATION OF WOUND VAC N/A 06/20/2022   Procedure: VAD POWERCORD TUNNEL WOUND VAC CHANGE;  Surgeon: Lovett Sox, MD;  [facility];  Service: Thoracic;  Laterality: N/A;   APPLICATION OF WOUND VAC N/A 06/27/2022   Procedure: WOUND VAC CHANGE;  Surgeon: Lovett Sox, MD;  [facility];  Service: Thoracic;  Laterality: N/A;   APPLICATION OF WOUND VAC N/A 07/08/2022   Procedure: WOUND VAC CHANGE;  Surgeon: Lovett Sox, MD;  [facility];  Service: Thoracic;  Laterality: N/A;   APPLICATION OF WOUND VAC N/A 01/28/2023   Procedure: APPLICATION OF WOUND VAC;  Surgeon: Lovett Sox, MD;  [facility];  Service: Thoracic;  Laterality: N/A;   APPLICATION OF WOUND VAC N/A 02/05/2023   Procedure: WOUND VAC CHANGE;  Surgeon: Lovett Sox, MD;  [facility];  Service: Thoracic;  Laterality: N/A;   APPLICATION OF WOUND VAC N/A 02/12/2023   Procedure: REMOVAL OF WOUND VAC DRESSING;  Surgeon: Lovett Sox, MD;  [facility];  Service: Thoracic;  Laterality: N/A;   APPLICATION OF WOUND VAC N/A 07/09/2023  Procedure: APPLICATION OF WOUND VAC;  Surgeon: Lovett Sox, MD;  [facility];  Service: Thoracic;  Laterality: N/A;   BACK SURGERY     CLIPPING OF ATRIAL APPENDAGE N/A 11/26/2019   Procedure: Clipping Of Atrial Appendage using AtriCure 40 Clip;  Surgeon: [name], MD;  [facility];  Service: Open Heart Surgery;  Laterality: N/A;   CORONARY ARTERY BYPASS GRAFT N/A 11/26/2019   Procedure:  CORONARY ARTERY BYPASS GRAFTING (CABG) using endoscopic greater saphenous vein harvest: svc to OM; svc to Diag; svc to PD; and LIMA to LAD.;  Surgeon: [name], MD;  [facility];  Service: Open Heart Surgery;  Laterality: N/A;   FOOT SURGERY     HAND SURGERY     INSERTION OF IMPLANTABLE LEFT VENTRICULAR ASSIST DEVICE N/A 11/17/2020   Procedure: INSERTION OF IMPLANTABLE LEFT VENTRICULAR ASSIST DEVICE - HM3;  Surgeon: [name], MD;  [facility];  Service: Open Heart Surgery;  Laterality: N/A;   INTRAOPERATIVE TRANSESOPHAGEAL ECHOCARDIOGRAM  12/04/2019   Procedure: Intraoperative Transesophageal Echocardiogram;  Surgeon: [name], MD;  [facility];  Service: Open Heart Surgery;;   MAZE N/A 11/26/2019   Procedure: MAZE using Bilateral Pulmonary Vein isolation.;  Surgeon: [name], MD;  [facility];  Service: Open Heart Surgery;  Laterality: N/A;   MULTIPLE EXTRACTIONS WITH ALVEOLOPLASTY N/A 11/16/2020   Procedure: MULTIPLE EXTRACTION WITH ALVEOLOPLASTY;  Surgeon: Sharman Cheek, DMD;  [facility];  Service: Dentistry;  Laterality: N/A;   PLACEMENT OF IMPELLA LEFT VENTRICULAR ASSIST DEVICE N/A 11/24/2019   Procedure: PLACEMENT OF IMPELLA 5.5 LEFT VENTRICULAR ASSIST DEVICE;  Surgeon: [name], MD;  [facility];  Service: Open Heart Surgery;  Laterality: N/A;   PLACEMENT OF IMPELLA LEFT VENTRICULAR ASSIST DEVICE Left 11/10/2020   Procedure: PLACEMENT OF IMPELLA 5.5 LEFT VENTRICULAR ASSIST DEVICE VIA  LEFT AVILLARY ARTERY;  Surgeon: [name], MD;  [facility];  Service: Open Heart Surgery;  Laterality: Left;   REMOVAL OF IMPELLA LEFT VENTRICULAR ASSIST DEVICE Right 12/04/2019   Procedure: REMOVAL OF IMPELLA LEFT VENTRICULAR ASSIST DEVICE, right axilla;  Surgeon: [name], MD;  Location: [facility] OR;  Service: Open Heart Surgery;  Laterality: Right;   RIGHT/LEFT HEART CATH AND CORONARY ANGIOGRAPHY N/A 11/24/2019    Procedure: RIGHT/LEFT HEART CATH AND CORONARY ANGIOGRAPHY;  Surgeon: Laurey Morale, MD;  Location: [facility] INVASIVE CV LAB;  Service: Cardiovascular;  Laterality: N/A;   RIGHT/LEFT HEART CATH AND CORONARY/GRAFT ANGIOGRAPHY N/A 11/09/2020   Procedure: RIGHT/LEFT HEART CATH AND CORONARY/GRAFT ANGIOGRAPHY;  Surgeon: [name], MD;  [facility] INVAS[facility] CV [facility];  Service: Cardiovascular;  Laterality: N/A;   STERNAL WOUND DEBRIDEMENT N/A 05/16/2022   Procedure: VAD DRIVELINE DEBRIDEMENT;  Surgeon: Lovett Sox, MD;  [facility];  Service: Thoracic;  Laterality: N/A;  VANC PULSE LAVAGE   STERNAL WOUND DEBRIDEMENT Left 06/14/2022   Procedure: DEBRIDEMENT OF VAD POWERCORD TUNNEL;  Surgeon: Lovett Sox, MD;  [facility];  Service: Thoracic;  Laterality: Left;   STERNAL WOUND DEBRIDEMENT N/A 06/27/2022   Procedure: ABDOMINAL WOUND DEBRIDEMENT, APPLICATION OF MYRIAD;  Surgeon: Lovett Sox, MD;  [facility];  Service: Thoracic;  Laterality: N/A;   STERNAL WOUND DEBRIDEMENT N/A 07/08/2022   Procedure: ABDOMINAL WOUND DEBRIDEMENT;  Surgeon: Lovett Sox, MD;  [facility];  Service: Thoracic;  Laterality: N/A;   STERNAL WOUND DEBRIDEMENT N/A 01/28/2023   Procedure: ABDOMINAL DRIVELINE WOUND DEBRIDEMENT;  Surgeon: Lovett Sox,  MD;  [facility];  Service: Thoracic;  Laterality: N/A;   STERNAL WOUND DEBRIDEMENT N/A 02/05/2023   Procedure: ABDOMINAL DRIVELINE WOUND DEBRIDEMENT;  Surgeon: Lovett Sox, MD;  [facility];  Service: Thoracic;  Laterality: N/A;   STERNAL WOUND DEBRIDEMENT N/A 02/12/2023   Procedure: ABDOMINAL DRIVELINE WOUND DEBRIDEMENT AND APPLICATION OF MATRIX WOUND DRESSING;  Surgeon: Lovett Sox, MD;  [facility];  Service: Thoracic;  Laterality: N/A;   STERNAL WOUND DEBRIDEMENT N/A 07/09/2023   Procedure: DRIVELINE WOUND DEBRIDEMENT;  Surgeon: Lovett Sox, MD;  [facility];  Service: Thoracic;  Laterality: N/A;   STERNAL WOUND DEBRIDEMENT N/A  07/17/2023   Procedure: DRIVELINE WOUND DEBRIDEMENT;  Surgeon: Lovett Sox, MD;  [facility];  Service: Thoracic;  Laterality: N/A;   TEE WITHOUT CARDIOVERSION N/A 11/24/2019   Procedure: TRANSESOPHAGEAL ECHOCARDIOGRAM (TEE);  Surgeon: [name], MD;  Location: [facility] OR;  Service: Open Heart Surgery;  Laterality: N/A;   TEE WITHOUT CARDIOVERSION N/A 11/26/2019   Procedure: TRANSESOPHAGEAL ECHOCARDIOGRAM (TEE);  Surgeon: [name], MD;  Location: [facility] OR;  Service: Open Heart Surgery;  Laterality: N/A;   TEE WITHOUT CARDIOVERSION N/A 11/10/2020   Procedure: TRANSESOPHAGEAL ECHOCARDIOGRAM (TEE);  Surgeon: [name], MD;  Location: [facility] OR;  Service: Open Heart Surgery;  Laterality: N/A;   TEE WITHOUT CARDIOVERSION N/A 11/17/2020   Procedure: TRANSESOPHAGEAL ECHOCARDIOGRAM (TEE);  Surgeon: [name], MD;  Location: [facility] OR;  Service: Open Heart Surgery;  Laterality: N/A;   WOUND EXPLORATION N/A 05/31/2022   Procedure: WOUND EXPLORATION, Debridement and Irrigation of abdominal wound;  Surgeon: Lovett Sox, MD;  [facility];  Service: Thoracic;  Laterality: N/A;     A IV Location/Drains/Wounds Patient Lines/Drains/Airways Status     Active Line/Drains/Airways     Name Placement date Placement time Site Days   Peripheral IV 11/05/23 18 G Left Forearm 11/05/23  0017  Forearm  less than 1   Peripheral IV 11/05/23 22 G Left Hand 11/05/23  0021  Hand  less than 1   PICC Single Lumen 10/17/23 Right Brachial 41 cm 0 cm 10/17/23  1049  Brachial  19            Intake/Output Last 24 hours  Intake/Output Summary (Last 24 hours) at 11/05/2023 0747 Last data filed at 11/05/2023 0109 Gross per 24 hour  Intake --  Output 320 ml  Net -320 ml    Labs/Imaging Results for orders placed or performed during the hospital encounter of 11/04/23 (from the past 48 hour(s))  Resp panel by RT-PCR (RSV, Flu A&B, Covid) Anterior Nasal Swab     Status: None   Collection  Time: 11/05/23 12:21 AM   Specimen: Anterior Nasal Swab  Result Value Ref Range   SARS Coronavirus 2 by RT PCR NEGATIVE NEGATIVE   Influenza A by PCR NEGATIVE NEGATIVE   Influenza B by PCR NEGATIVE NEGATIVE    Comment: (NOTE) The Xpert Xpress SARS-CoV-2/FLU/RSV plus assay is intended as an aid in the diagnosis of influenza from Nasopharyngeal swab specimens and should not be used as a sole basis for treatment. Nasal washings and aspirates are unacceptable for Xpert Xpress SARS-CoV-2/FLU/RSV testing.  Fact Sheet for Patients: BloggerCourse.com  Fact Sheet for Healthcare Providers: SeriousBroker.it  This test is not yet approved or cleared by the [place] FDA and has been authorized for detection and/or diagnosis of SARS-CoV-2 by FDA under an Emergency Use Authorization (EUA). This EUA will remain in effect (meaning this test  can be used) for the duration of the COVID-19 declaration under Section 564(b)(1) of the Act, 21 U.S.C. section 360bbb-3(b)(1), unless the authorization is terminated or revoked.     Resp Syncytial Virus by PCR NEGATIVE NEGATIVE    Comment: (NOTE) Fact Sheet for Patients: BloggerCourse.com  Fact Sheet for Healthcare Providers: SeriousBroker.it  This test is not yet approved or cleared by the [place] FDA and has been authorized for detection and/or diagnosis of SARS-CoV-2 by FDA under an Emergency Use Authorization (EUA). This EUA will remain in effect (meaning this test can be used) for the duration of the COVID-19 declaration under Section 564(b)(1) of the Act, 21 U.S.C. section 360bbb-3(b)(1), unless the authorization is terminated or revoked.  Performed at [facility], [address] [place], [place] [address]   Comprehensive metabolic panel     Status: Abnormal   Collection Time: 11/05/23 12:21 AM  Result Value Ref Range    Sodium 130 (L) 135 - 145 mmol/L   Potassium 3.4 (L) 3.5 - 5.1 mmol/L   Chloride 101 98 - 111 mmol/L   CO2 20 (L) 22 - 32 mmol/L   Glucose, Bld 109 (H) 70 - 99 mg/dL    Comment: Glucose reference range applies only to samples taken after fasting for at least 8 hours.   BUN 13 8 - 23 mg/dL   Creatinine, Ser 3.66 0.61 - 1.24 mg/dL   Calcium 8.0 (L) 8.9 - 10.3 mg/dL   Total Protein 5.8 (L) 6.5 - 8.1 g/dL   Albumin 2.1 (L) 3.5 - 5.0 g/dL   AST 30 15 - 41 U/L   ALT 25 0 - 44 U/L   Alkaline Phosphatase 125 38 - 126 U/L   Total Bilirubin 0.6 <1.2 mg/dL   GFR, Estimated >44 >03 mL/min    Comment: (NOTE) Calculated using the CKD-EPI Creatinine Equation (2021)    Anion gap 9 5 - 15    Comment: Performed at [facility], [address], [place], [place] [address]  CBC with Differential     Status: Abnormal   Collection Time: 11/05/23 12:21 AM  Result Value Ref Range   WBC 14.1 (H) 4.0 - 10.5 K/uL   RBC 3.21 (L) 4.22 - 5.81 MIL/uL   Hemoglobin 7.9 (L) 13.0 - 17.0 g/dL    Comment: Reticulocyte Hemoglobin testing may be clinically indicated, consider ordering this additional test [id number]    HCT 24.5 (L) 39.0 - 52.0 %   MCV 76.3 (L) 80.0 - 100.0 fL   MCH 24.6 (L) 26.0 - 34.0 pg   MCHC 32.2 30.0 - 36.0 g/dL   RDW 43.3 (H) 29.5 - 18.8 %   Platelets 252 150 - 400 K/uL   nRBC 0.0 0.0 - 0.2 %   Neutrophils Relative % 83 %   Neutro Abs 11.8 (H) 1.7 - 7.7 K/uL   Lymphocytes Relative 9 %   Lymphs Abs 1.3 0.7 - 4.0 K/uL   Monocytes Relative 7 %   Monocytes Absolute 0.9 0.1 - 1.0 K/uL   Eosinophils Relative 0 %   Eosinophils Absolute 0.0 0.0 - 0.5 K/uL   Basophils Relative 0 %   Basophils Absolute 0.1 0.0 - 0.1 K/uL   Immature Granulocytes 1 %   Abs Immature Granulocytes 0.10 (H) 0.00 - 0.07 K/uL    Comment: Performed at [facility], [address], [place], [place] [address]  Protime-INR     Status: Abnormal   Collection Time: 11/05/23 12:21 AM  Result Value Ref Range    Prothrombin Time 22.7 (H) 11.4 - 15.2 seconds   INR 2.0 (H) 0.8 - 1.2    Comment: (NOTE) INR goal varies based on device and disease states. Performed at [facility], [address], [place], [place] [address]   APTT     Status: Abnormal   Collection Time: 11/05/23 12:21 AM  Result Value Ref Range   aPTT 62 (H) 24 - 36 seconds    Comment:        IF BASELINE aPTT IS ELEVATED, SUGGEST PATIENT RISK ASSESSMENT BE USED TO DETERMINE APPROPRIATE ANTICOAGULANT THERAPY. Performed at [facility], [address], [place], [place] [address]   Blood Culture (routine x 2)     Status: None (Preliminary result)   Collection Time: 11/05/23 12:21 AM   Specimen: BLOOD RIGHT FOREARM  Result Value Ref Range   Specimen Description BLOOD RIGHT FOREARM    Special Requests      BOTTLES DRAWN AEROBIC AND ANAEROBIC Blood Culture adequate volume   Culture      NO GROWTH < 12 HOURS Performed at [facility], [address] [place] [address], [place], [place] [address]    Report Status PENDING   Lipase, blood     Status: Abnormal   Collection Time: 11/05/23 12:21 AM  Result Value Ref Range   Lipase 56 (H) 11 - 51 U/L    Comment: Performed at [facility], [address] [place], [place] [address]  Lactate dehydrogenase     Status: None   Collection Time: 11/05/23 12:21 AM  Result Value Ref Range   LDH 135 98 - 192 U/L    Comment: Performed at [facility], [address], [place], [place] [address]  I-Stat Chem 8, ED     Status: Abnormal   Collection Time: 11/05/23 12:29 AM  Result Value Ref Range   Sodium 136 135 - 145 mmol/L   Potassium 3.5 3.5 - 5.1 mmol/L   Chloride 102 98 - 111 mmol/L   BUN 12 8 - 23 mg/dL   Creatinine, Ser 1.30 0.61 - 1.24 mg/dL   Glucose, Bld 865 (H) 70 - 99 mg/dL    Comment: Glucose reference range applies only to samples taken after fasting for at least 8 hours.   Calcium, Ion 1.14 (L) 1.15 - 1.40 mmol/L   TCO2 21 (L) 22 - 32 mmol/L   Hemoglobin 8.2 (L)  13.0 - 17.0 g/dL   HCT 78.4 (L) 69.6 - 29.5 %  I-Stat Lactic Acid, ED     Status: None   Collection Time: 11/05/23 12:31 AM  Result Value Ref Range   Lactic Acid, Venous 1.9 0.5 - 1.9 mmol/L  Blood Culture (routine x 2)     Status: None (Preliminary result)   Collection Time: 11/05/23 12:57 AM   Specimen: BLOOD LEFT HAND  Result Value Ref Range   Specimen Description BLOOD LEFT HAND    Special Requests      BOTTLES DRAWN AEROBIC AND ANAEROBIC Blood Culture adequate volume   Culture      NO GROWTH < 12 HOURS Performed at [facility], [address], [place], [place] [address]    Report Status PENDING   Urinalysis, w/ Reflex to Culture (Infection Suspected) -Urine, Clean Catch     Status: None   Collection Time: 11/05/23  1:08 AM  Result Value Ref Range   Specimen Source URINE, CLEAN CATCH  Color, Urine YELLOW YELLOW   APPearance CLEAR CLEAR   Specific Gravity, Urine 1.010 1.005 - 1.030   pH 8.0 5.0 - 8.0   Glucose, UA NEGATIVE NEGATIVE mg/dL   Hgb urine dipstick NEGATIVE NEGATIVE   Bilirubin Urine NEGATIVE NEGATIVE   Ketones, ur NEGATIVE NEGATIVE mg/dL   Protein, ur NEGATIVE NEGATIVE mg/dL   Nitrite NEGATIVE NEGATIVE   Leukocytes,Ua NEGATIVE NEGATIVE   RBC / HPF 0-5 0 - 5 RBC/hpf   WBC, UA 0-5 0 - 5 WBC/hpf    Comment:        Reflex urine culture not performed if WBC <=10, OR if Squamous epithelial cells >5. If Squamous epithelial cells >5 suggest recollection.    Bacteria, UA NONE SEEN NONE SEEN   Squamous Epithelial / HPF 0-5 0 - 5 /HPF   Mucus PRESENT     Comment: Performed at [facility], [address], [place], [place] [address]  I-Stat Lactic Acid, ED     Status: None   Collection Time: 11/05/23  2:26 AM  Result Value Ref Range   Lactic Acid, Venous 1.6 0.5 - 1.9 mmol/L  Type and screen [facility]     Status: None (Preliminary result)   Collection Time: 11/05/23  6:49 AM  Result Value Ref Range   ABO/RH(D) PENDING     Antibody Screen PENDING    Sample Expiration      ,2359 Performed at [facility], [address], [place], [place] [address]    CT CHEST ABDOMEN PELVIS W CONTRAST  Result Date: 11/05/2023 CLINICAL DATA:  Sepsis. 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 Progressive abdominal pain. EXAM: CT CHEST, ABDOMEN, AND PELVIS WITH CONTRAST TECHNIQUE: Multidetector CT imaging of the chest, abdomen and pelvis was performed following the standard protocol during bolus administration of intravenous contrast. RADIATION DOSE REDUCTION: This exam was performed according to the departmental dose-optimization program which includes automated exposure control, adjustment of the mA and/or kV according to patient size and/or use of iterative reconstruction technique. CONTRAST:  75mL OMNIPAQUE IOHEXOL 350 MG/ML SOLN COMPARISON:  10/09/2023 FINDINGS: CT CHEST FINDINGS Cardiovascular: Coronary artery bypass grafting, left atrial clipping, and left ventricular assist device insertion have been performed. Outflow cannula and aortic anastomosis are widely patent. There is stable soft tissue thickening seen surrounding the drive line as it enters the epigastrium and tracking to the left ventricular assist device is self, unchanged from prior examination. Cardiac size is mildly enlarged, unchanged. Central pulmonary arteries are enlarged in keeping with changes of pulmonary arterial hypertension. Moderate atherosclerotic calcification is seen within the thoracic aorta. No aortic aneurysm. Right upper extremity PICC line tip is seen within the superior vena cava. Mediastinum/Nodes: No enlarged mediastinal, hilar, or axillary lymph nodes. Thyroid gland, trachea, and esophagus demonstrate no significant findings. Lungs/Pleura: Moderate emphysema. Mild bibasilar atelectasis or scarring. Stable bronchial wall thickening in keeping with chronic airway inflammation. No confluent pulmonary infiltrate. No pneumothorax  or pleural effusion. Musculoskeletal: No chest wall mass or suspicious bone lesions identified. CT ABDOMEN PELVIS FINDINGS Hepatobiliary: No focal liver abnormality is seen. No gallstones, gallbladder wall thickening, or biliary dilatation. 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 Adrenals/Urinary Tract: The adrenal glands are unremarkable. The kidneys are normal in size and position. 2 mm nonobstructing calculus noted within the upper pole of the left kidney. The kidneys are otherwise unremarkable. Bladder is unremarkable Stomach/Bowel: Large  broad-based ventral hernia contains multiple loops of unremarkable small bowel as well as the mid transverse colon. The stomach, small bowel, and large bowel are otherwise unremarkable. Appendix normal. No free intraperitoneal gas or fluid Vascular/Lymphatic: Extensive aortoiliac atherosclerotic calcification. No aortic aneurysm. No pathologic adenopathy the abdomen and pelvis. Reproductive: Prostate is unremarkable. Other: Small fat containing bilateral hernias again Musculoskeletal: L4-S1 posterior lumbar fusion with instrumentation with posterior decompression L4 and L5. No acute bone abnormality no lytic or blastic bone lesion. IMPRESSION: 1. 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 2. Stable soft tissue thickening surrounding the drive line as it enters the epigastrium and tracking to the left ventricular assist device itself, unchanged from prior examination. 3. Moderate emphysema. Stable bronchial wall thickening in keeping with chronic airway inflammation. 4. Minimal left nonobstructing nephrolithiasis. 5. Large broad-based ventral hernia containing multiple loops of unremarkable small bowel as well as the mid transverse colon. Aortic Atherosclerosis (ICD10-I70.0) and Emphysema (ICD10-J43.9). Electronically Signed   By:  Helyn Numbers M.D.   On: 11/05/2023 03:18   DG Ankle Complete Left  Result Date: 11/05/2023 CLINICAL DATA:  Pain possible fall EXAM: LEFT ANKLE COMPLETE - 3+ VIEW COMPARISON:  04/29/2009 FINDINGS: No definite acute displaced fracture or malalignment is seen. Advanced tibiotalar degenerative changes with joint space narrowing, erosive changes, subarticular sclerosis and cyst formation. Probable ankle effusion. Vascular calcifications. IMPRESSION: 1. No definite fracture identified allowing for advanced degenerative change 2. 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 Electronically Signed   By: Jasmine Pang M.D.   On: 11/05/2023 01:44   DG Chest Port 1 View  Result Date: 11/05/2023 CLINICAL DATA:  Possible sepsis EXAM: PORTABLE CHEST 1 VIEW COMPARISON:  10/08/2023 FINDINGS: Post sternotomy changes and left atrial appendage clip. LVAD partially visualized. No focal opacity, pleural effusion or pneumothorax. Right upper extremity central venous catheter tip over the SVC. Stable cardiomediastinal silhouette with aortic atherosclerosis. IMPRESSION: No active disease. Electronically Signed   By: Jasmine Pang M.D.   On: 11/05/2023 01:39    Pending Labs Unresulted Labs (From admission, onward)     Start     Ordered   11/06/23 0500  Lactate dehydrogenase  Daily,   R      11/05/23 0742   11/06/23 0500  Protime-INR  Daily,   R      11/05/23 0742   11/06/23 0500  CBC  Daily,   R      11/05/23 0742   11/06/23 0500  Basic metabolic panel  Daily,   R      11/05/23 0742            Vitals/Pain Today's Vitals   11/05/23 0519 11/05/23 0600 11/05/23 0630 11/05/23 0652  BP:  100/85 90/78   Pulse:  95 96   Resp:  19 15   Temp:      TempSrc:      SpO2:  98% 96%   Weight:      Height:      PainSc: 7    7     Isolation Precautions No active isolations  Medications Medications  HYDROmorphone (DILAUDID) injection 0.5 mg (has no  administration in time range)  traMADol (ULTRAM) tablet 50 mg (has no administration in time range)  rosuvastatin (CRESTOR) tablet 20 mg (has no administration in time range)  sertraline (ZOLOFT) tablet 50 mg (has no administration in time range)  pantoprazole (PROTONIX) EC tablet 40 mg (has no  administration in time range)  tamsulosin (FLOMAX) capsule 0.4 mg (has no administration in time range)  zinc gluconate tablet 50 mg (has no administration in time range)  albuterol (VENTOLIN HFA) 108 (90 Base) MCG/ACT inhaler 2 puff (has no administration in time range)  Ipratropium-Albuterol (COMBIVENT) respimat 1 puff (has no administration in time range)  fluticasone furoate-vilanterol (BREO ELLIPTA) 100-25 MCG/ACT 1 puff (has no administration in time range)    And  umeclidinium bromide (INCRUSE ELLIPTA) 62.5 MCG/ACT 1 puff (has no administration in time range)  acetaminophen (TYLENOL) tablet 650 mg (has no administration in time range)  ondansetron (ZOFRAN) injection 4 mg (has no administration in time range)  minocycline (MINOCIN) capsule 200 mg (has no administration in time range)  vancomycin (VANCOCIN) IVPB 1000 mg/200 mL premix (0 mg Intravenous Stopped 11/05/23 0233)    Followed by  vancomycin (VANCOCIN) IVPB 1000 mg/200 mL premix (0 mg Intravenous Stopped 11/05/23 0336)  ondansetron (ZOFRAN) injection 4 mg (4 mg Intravenous Given 11/05/23 0121)  fentaNYL (SUBLIMAZE) injection 50 mcg (50 mcg Intravenous Given 11/05/23 0119)  meropenem (MERREM) 1 g in sodium chloride 0.9 % 100 mL IVPB (0 g Intravenous Stopped 11/05/23 0255)  iohexol (OMNIPAQUE) 350 MG/ML injection 75 mL (75 mLs Intravenous Contrast Given 11/05/23 0214)  fentaNYL (SUBLIMAZE) injection 50 mcg (50 mcg Intravenous Given 11/05/23 0237)  fentaNYL (SUBLIMAZE) injection 50 mcg (50 mcg Intravenous Given 11/05/23 0338)  fentaNYL (SUBLIMAZE) injection 50 mcg (50 mcg Intravenous Given 11/05/23 0515)  fentaNYL (SUBLIMAZE) injection 50  mcg (50 mcg Intravenous Given 11/05/23 1914)    Mobility walks     Focused Assessments    R Recommendations: See Admitting Provider Note  Report given to:   Additional Notes:

## 2023-11-05 NOTE — Progress Notes (Signed)
This chaplain responded the PMT NP-Eric referral for EOL spiritual care. The Pt. preferred name is "Kevin Underwood". The Pt is awake and accepts a spiritual care visit. The Pt. wife-Tracy is at the bedside at the beginning of the visit.  The chaplain listened reflectively as the Pt. discussed his grandmother's role in teaching him about God's love. The chaplain affirmed the Pt. story and reassured the Pt. of his belief in God's everlasting love. The chaplain observed the Pt physically relax in the setting of recognizing he is forgiven.  This chaplain understands the Pt. family is planning to visit at the bedside on Friday. The chaplain provided education on how to ask for a chaplain presence as needed. The Pt. accepted the chaplain's invitation for intercessory prayer.  Chaplain Stephanie Acre [phone]

## 2023-11-05 NOTE — ED Notes (Signed)
CT called RN to transport pt. Pending availability. CT tech to call RN back when available.

## 2023-11-05 NOTE — ED Provider Notes (Signed)
[place] EMERGENCY DEPARTMENT AT [facility] Provider Note   CSN: [id number] Arrival date & time: 11/04/23  2354     History  Chief Complaint  Patient presents with   Abdominal Pain    Kevin Yarber. is a 62 y.o. male.  The history is provided by the patient, the EMS personnel and medical records.  Kevin Thome. is a 62 y.o. male who presents to the Emergency Department complaining of abdominal pain.  Kevin Underwood presents to the emergency department by EMS for evaluation of abdominal pain that started at 5 AM.  Pain is described as a sharp and constant abdominal pain.  Kevin Underwood is unsure if Kevin Underwood is having fevers.  Kevin Underwood has nausea but no vomiting.  Kevin Underwood also reports dysuria.  Kevin Underwood does have chronic abdominal pain at his site of umbilical hernia.  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  Reports associated cough for 1 week.     Home Medications Prior to Admission medications   Medication Sig Start Date End Date Taking? Authorizing Provider  acetaminophen (TYLENOL) 325 MG tablet Take 2 tablets (650 mg total) by mouth every 4 (four) hours as needed for headache or mild pain. 02/20/23   Alen Bleacher, NP  albuterol (PROVENTIL) (2.5 MG/3ML) 0.083% nebulizer solution INHALE 3 ML BY NEBULIZATION EVERY 6 HOURS AS NEEDED FOR WHEEZING OR SHORTNESS OF BREATH 09/10/22   Laurey Morale, MD  albuterol (VENTOLIN HFA) 108 (90 Base) MCG/ACT inhaler Inhale 2 puffs into the lungs every 6 (six) hours as needed for wheezing or shortness of breath.    [provider]  allopurinol (ZYLOPRIM) 100 MG tablet Take 1 tablet (100 mg total) by mouth daily. 12/04/21   Laurey Morale, MD  amLODipine (NORVASC) 10 MG tablet Take 1 tablet (10 mg total) by mouth daily. 08/16/23   Andrey Farmer, PA-C  colchicine 0.6 MG tablet Take 1 tablet (0.6 mg total) by mouth daily as needed (gout pain). Patient taking differently: Take 0.6 mg by mouth daily. 04/14/23   Laurey Morale, MD   COMBIVENT RESPIMAT 20-100 MCG/ACT AERS respimat Inhale 1 puff into the lungs every 6 (six) hours. 09/10/22   [provider]  fluticasone Aleda Grana ALLERGY RELIEF) 50 MCG/ACT nasal spray Place 2 sprays into both nostrils as needed for allergies or rhinitis.    [provider]  glipiZIDE (GLUCOTROL) 5 MG tablet Take 1 tablet (5 mg total) by mouth daily. 04/14/23 04/13/24  Laurey Morale, MD  hydrALAZINE (APRESOLINE) 50 MG tablet Take 1 tablet (50 mg total) by mouth every 8 (eight) hours. 10/16/23   Robbie Lis M, PA-C  HYDROmorphone HCl (EXALGO) 8 MG TB24 Take 1 tablet (8 mg total) by mouth daily. 10/17/23   Alen Bleacher, NP  lidocaine (LIDODERM) 5 % Place 1 patch onto the skin daily. Remove & Discard patch within 12 hours or as directed by MD 10/02/23   Alen Bleacher, NP  losartan (COZAAR) 25 MG tablet Take 0.5 tablets (12.5 mg total) by mouth daily. 10/17/23   Robbie Lis M, PA-C  Menthol, Topical Analgesic, (BIOFREEZE) 10 % CREA Apply 1 Application topically 2 (two) times daily as needed (Apply to all joints for pain).    [provider]  meropenem (MERREM) IVPB Inject 1 g into the vein every 8 (eight) hours. 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 10/14/23 11/24/23  Lee, [place], NP  metFORMIN (GLUCOPHAGE) 1000 MG tablet Take 1 tablet (1,000 mg total) by mouth 2 (two) times daily. 04/14/23   Laurey Morale, MD  metoprolol succinate (TOPROL XL) 25 MG 24 hr tablet Take 1 tablet (25 mg total) by mouth daily. 08/16/23   Andrey Farmer, PA-C  minocycline (MINOCIN) 100 MG capsule Take 2 capsules  (200 mg total) by mouth 2 (two) times daily. 10/31/23   Laurey Morale, MD  Multiple Vitamin (MULTIVITAMIN WITH MINERALS) TABS tablet Take 1 tablet by mouth daily. 02/20/23   Alen Bleacher, NP  pantoprazole (PROTONIX) 40 MG tablet Take 1 tablet (40 mg total) by mouth daily. 04/14/23   Laurey Morale, MD  rosuvastatin (CRESTOR) 20 MG tablet Take 1 tablet (20 mg total) by mouth daily. 08/15/23   Andrey Farmer, PA-C  senna-docusate (SENOKOT-S) 8.6-50 MG tablet Take 2 tablets by mouth at bedtime. 10/02/23   Alen Bleacher, NP  sertraline (ZOLOFT) 50 MG tablet Take 1 tablet (50 mg total) by mouth daily. Patient taking differently: Take 50 mg by mouth at bedtime. 04/14/23   Laurey Morale, MD  tamsulosin (FLOMAX) 0.4 MG CAPS capsule Take 1 capsule (0.4 mg total) by mouth daily. 04/14/23   Laurey Morale, MD  thiamine (VITAMIN B-1) 100 MG tablet Take 1 tablet (100 mg total) by mouth daily. Patient not taking: Reported on 10/09/2023 02/20/23   Alen Bleacher, NP  traMADol (ULTRAM) 50 MG tablet Take 1 tablet (50 mg total) by mouth every 6 (six) hours as needed. 11/01/23   Laurey Morale, MD  TRELEGY ELLIPTA 100-62.5-25 MCG/ACT AEPB Inhale 1 puff into the lungs daily. 04/14/23   Laurey Morale, MD  warfarin (COUMADIN) 4 MG tablet Take 1-1.5 tablets (4-6 mg total) by mouth See admin instructions. Take 1.5 tablets (6 mg) every Wednesday, Take 1 tablet (4 mg) all other days or as directed by Heart Failure clinic 10/02/23   Alen Bleacher, NP  zinc gluconate 50 MG tablet Take 50 mg by mouth daily.    [provider]      Allergies    Firvanq [vancomycin]    Review of Systems   Review of Systems  All other systems reviewed and are negative.   Physical Exam Updated Vital Signs BP 90/78   Pulse 96   Temp 98.9 F (37.2 C)   Resp 15   Ht 5\' 10"  (1.778 m)   Wt 85.3 kg   SpO2 96%   BMI 26.98 kg/m  Physical Exam Vitals and nursing note reviewed.  Constitutional:      Appearance: Kevin Underwood is  well-developed.  HENT:     Head: Normocephalic and atraumatic.  Cardiovascular:     Comments: Mechanical hum Pulmonary:     Effort: Pulmonary effort is normal. No respiratory distress.     Comments: Occasional crackle in the right lung base, unable to hear left sided lung sounds due to LVAD Abdominal:     Palpations: Abdomen is soft.     Tenderness: There is no guarding or rebound.     Comments: Large left-sided abdominal wall ventral hernia that is tender to palpation but soft  Musculoskeletal:  Comments: There is ecchymosis over the left distal leg, no significant soft tissue swelling.  Complains of pain to the left lateral ankle without significant tenderness in this area.  Skin:    General: Skin is warm and dry.  Neurological:     Mental Status: Kevin Underwood is alert and oriented to person, place, and time.  Psychiatric:        Behavior: Behavior normal.     ED Results / Procedures / Treatments   Labs (all labs ordered are listed, but only abnormal results are displayed) Labs Reviewed  COMPREHENSIVE METABOLIC PANEL - Abnormal; Notable for the following components:      Result Value   Sodium 130 (*)    Potassium 3.4 (*)    CO2 20 (*)    Glucose, Bld 109 (*)    Calcium 8.0 (*)    Total Protein 5.8 (*)    Albumin 2.1 (*)    All other components within normal limits  CBC WITH DIFFERENTIAL/PLATELET - Abnormal; Notable for the following components:   WBC 14.1 (*)    RBC 3.21 (*)    Hemoglobin 7.9 (*)    HCT 24.5 (*)    MCV 76.3 (*)    MCH 24.6 (*)    RDW 16.0 (*)    Neutro Abs 11.8 (*)    Abs Immature Granulocytes 0.10 (*)    All other components within normal limits  PROTIME-INR - Abnormal; Notable for the following components:   Prothrombin Time 22.7 (*)    INR 2.0 (*)    All other components within normal limits  APTT - Abnormal; Notable for the following components:   aPTT 62 (*)    All other components within normal limits  LIPASE, BLOOD - Abnormal; Notable for the  following components:   Lipase 56 (*)    All other components within normal limits  I-STAT CHEM 8, ED - Abnormal; Notable for the following components:   Glucose, Bld 104 (*)    Calcium, Ion 1.14 (*)    TCO2 21 (*)    Hemoglobin 8.2 (*)    HCT 24.0 (*)    All other components within normal limits  RESP PANEL BY RT-PCR (RSV, FLU A&B, COVID)  RVPGX2  CULTURE, BLOOD (ROUTINE X 2)  CULTURE, BLOOD (ROUTINE X 2)  URINALYSIS, W/ REFLEX TO CULTURE (INFECTION SUSPECTED)  LACTATE DEHYDROGENASE  I-STAT CG4 LACTIC ACID, ED  I-STAT CG4 LACTIC ACID, ED  TYPE AND SCREEN    EKG EKG Interpretation Date/Time:  Wednesday November 05 2023 00:44:44 EST Ventricular Rate:  111 PR Interval:  175 QRS Duration:  75 QT Interval:  341 QTC Calculation: 464 R Axis:   108  Text Interpretation: Sinus tachycardia Anterolateral infarct, age indeterminate Artifact in lead(s) I II III aVR aVF V1 V2 V3 V4 V5 V6 LOW VOLTAGE When compared with ECG of 10/08/2023, No significant change was found Confirmed by Dione Booze ([id number]) on 11/05/2023 4:23:44 AM  Radiology CT CHEST ABDOMEN PELVIS W CONTRAST  Result Date: 11/05/2023 CLINICAL DATA:  Sepsis. 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 Progressive abdominal pain. EXAM: CT CHEST, ABDOMEN, AND PELVIS WITH CONTRAST TECHNIQUE: Multidetector CT imaging of the chest, abdomen and pelvis was performed following the standard protocol during bolus administration of intravenous contrast. RADIATION DOSE REDUCTION: This exam was performed according to the departmental dose-optimization program which includes automated exposure control, adjustment of the mA and/or kV according to patient size and/or use of iterative reconstruction technique. CONTRAST:  75mL OMNIPAQUE IOHEXOL 350  MG/ML SOLN COMPARISON:  10/09/2023 FINDINGS: CT CHEST FINDINGS Cardiovascular: Coronary artery bypass grafting, left atrial clipping, and left ventricular assist device insertion have been  performed. Outflow cannula and aortic anastomosis are widely patent. There is stable soft tissue thickening seen surrounding the drive line as it enters the epigastrium and tracking to the left ventricular assist device is self, unchanged from prior examination. Cardiac size is mildly enlarged, unchanged. Central pulmonary arteries are enlarged in keeping with changes of pulmonary arterial hypertension. Moderate atherosclerotic calcification is seen within the thoracic aorta. No aortic aneurysm. Right upper extremity PICC line tip is seen within the superior vena cava. Mediastinum/Nodes: No enlarged mediastinal, hilar, or axillary lymph nodes. Thyroid gland, trachea, and esophagus demonstrate no significant findings. Lungs/Pleura: Moderate emphysema. Mild bibasilar atelectasis or scarring. Stable bronchial wall thickening in keeping with chronic airway inflammation. No confluent pulmonary infiltrate. No pneumothorax or pleural effusion. Musculoskeletal: No chest wall mass or suspicious bone lesions identified. CT ABDOMEN PELVIS FINDINGS Hepatobiliary: No focal liver abnormality is seen. No gallstones, gallbladder wall thickening, or biliary dilatation. 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 Adrenals/Urinary Tract: The adrenal glands are unremarkable. The kidneys are normal in size and position. 2 mm nonobstructing calculus noted within the upper pole of the left kidney. The kidneys are otherwise unremarkable. Bladder is unremarkable Stomach/Bowel: Large broad-based ventral hernia contains multiple loops of unremarkable small bowel as well as the mid transverse colon. The stomach, small bowel, and large bowel are otherwise unremarkable. Appendix normal. No free intraperitoneal gas or fluid Vascular/Lymphatic: Extensive aortoiliac atherosclerotic calcification. No aortic aneurysm. No pathologic adenopathy the  abdomen and pelvis. Reproductive: Prostate is unremarkable. Other: Small fat containing bilateral hernias again Musculoskeletal: L4-S1 posterior lumbar fusion with instrumentation with posterior decompression L4 and L5. No acute bone abnormality no lytic or blastic bone lesion. IMPRESSION: 1. 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 2. Stable soft tissue thickening surrounding the drive line as it enters the epigastrium and tracking to the left ventricular assist device itself, unchanged from prior examination. 3. Moderate emphysema. Stable bronchial wall thickening in keeping with chronic airway inflammation. 4. Minimal left nonobstructing nephrolithiasis. 5. Large broad-based ventral hernia containing multiple loops of unremarkable small bowel as well as the mid transverse colon. Aortic Atherosclerosis (ICD10-I70.0) and Emphysema (ICD10-J43.9). Electronically Signed   By: Helyn Numbers M.D.   On: 11/05/2023 03:18   DG Ankle Complete Left  Result Date: 11/05/2023 CLINICAL DATA:  Pain possible fall EXAM: LEFT ANKLE COMPLETE - 3+ VIEW COMPARISON:  04/29/2009 FINDINGS: No definite acute displaced fracture or malalignment is seen. Advanced tibiotalar degenerative changes with joint space narrowing, erosive changes, subarticular sclerosis and cyst formation. Probable ankle effusion. Vascular calcifications. IMPRESSION: 1. No definite fracture identified allowing for advanced degenerative change 2. 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 Electronically Signed   By: Jasmine Pang M.D.   On: 11/05/2023 01:44   DG Chest Port 1 View  Result Date: 11/05/2023 CLINICAL DATA:  Possible sepsis EXAM: PORTABLE CHEST 1 VIEW COMPARISON:  10/08/2023 FINDINGS: Post sternotomy changes and left atrial appendage clip. LVAD partially visualized. No focal opacity, pleural effusion or pneumothorax. Right upper extremity  central venous catheter tip over the SVC. Stable cardiomediastinal silhouette with aortic atherosclerosis. IMPRESSION: No active disease. Electronically Signed   By: Jasmine Pang M.D.   On: 11/05/2023  01:39    Procedures Procedures   CRITICAL CARE Performed by: Tilden Fossa   Total critical care time: 35 minutes  Critical care time was exclusive of separately billable procedures and treating other patients.  Critical care was necessary to treat or prevent imminent or life-threatening deterioration.  Critical care was time spent personally by me on the following activities: development of treatment plan with patient and/or surrogate as well as nursing, discussions with consultants, evaluation of patient's response to treatment, examination of patient, obtaining history from patient or surrogate, ordering and performing treatments and interventions, ordering and review of laboratory studies, ordering and review of radiographic studies, pulse oximetry and re-evaluation of patient's condition.  Medications Ordered in ED Medications  vancomycin (VANCOCIN) IVPB 1000 mg/200 mL premix (0 mg Intravenous Stopped 11/05/23 0233)    Followed by  vancomycin (VANCOCIN) IVPB 1000 mg/200 mL premix (0 mg Intravenous Stopped 11/05/23 0336)  ondansetron (ZOFRAN) injection 4 mg (4 mg Intravenous Given 11/05/23 0121)  fentaNYL (SUBLIMAZE) injection 50 mcg (50 mcg Intravenous Given 11/05/23 0119)  meropenem (MERREM) 1 g in sodium chloride 0.9 % 100 mL IVPB (0 g Intravenous Stopped 11/05/23 0255)  iohexol (OMNIPAQUE) 350 MG/ML injection 75 mL (75 mLs Intravenous Contrast Given 11/05/23 0214)  fentaNYL (SUBLIMAZE) injection 50 mcg (50 mcg Intravenous Given 11/05/23 0237)  fentaNYL (SUBLIMAZE) injection 50 mcg (50 mcg Intravenous Given 11/05/23 0338)  fentaNYL (SUBLIMAZE) injection 50 mcg (50 mcg Intravenous Given 11/05/23 0515)  fentaNYL (SUBLIMAZE) injection 50 mcg (50 mcg Intravenous Given 11/05/23 1610)     ED Course/ Medical Decision Making/ A&P                                 Medical Decision Making Amount and/or Complexity of Data Reviewed Labs: ordered. Radiology: ordered. ECG/medicine tests: ordered.  Risk Prescription drug management. Decision regarding hospitalization.   Patient with history of LVAD, chronic driveline infection here for evaluation of acute on chronic abdominal pain, found to be febrile on ED arrival to 100.4.  Pharmacy consulted regarding medication recommendations.  Will broaden his antibiotics.  CBC with mild leukocytosis, hemoglobin is slightly downtrending.  CT abdomen pelvis demonstrates splenic infarct, no additional acute abnormality.  Discussed with LVAD coordinator regarding admitting for ongoing care.  Gust with patient findings of studies and Kevin Underwood is in agreement with treatment plan.        Final Clinical Impression(s) / ED Diagnoses Final diagnoses:  None    Rx / DC Orders ED Discharge Orders     None         Tilden Fossa, MD 11/05/23 9604    Tilden Fossa, MD 11/06/23 1020

## 2023-11-05 NOTE — Progress Notes (Signed)
CSW met with patient and wife at bedside in the ED to discuss concerns of decline and options for care. Patient was immediately shared his pain and discomfort for the past few days and stated "I'm done". CSW and patient had extensive conversation about his goals of care and comfort. Patient states he does not want to any further aggressive care and had already told the ED RN that he did not want to have the antibiotics. Patient was very clear that he wants to be a DNR and address his comfort as a primary goal. He is aware that he is at the end of life and agreeable to hospice/comfort care. Patient's wife appears tearful and agreeable to plan. CSW provided supportive intervention and will be available as needed. Lasandra Beech, LCSW, CCSW-MCS [phone]

## 2023-11-05 NOTE — Progress Notes (Signed)
LVAD Coordinator ED Encounter  Kevin Underwood. a 62 y.o. male that presented to [facility] ER today due to abdominal pain and shortness of breath. He has a past medical history  has a past medical history of ", Arthritis, CAD (coronary artery disease), CHF (congestive heart failure) (HCC), Essential hypertension, PAF (paroxysmal atrial fibrillation) (HCC), and Type 2 diabetes mellitus (HCC).Marland Kitchen   LVAD is a HM 3 and was implanted on 11/17/20 by Dr Vickey Sages for destination therapy.   Pt has chronic drive line infection. Recently hospitalized with bacteriemia and was discharged from the hospital 11/8 with PICC line- Meropenem 1 g q 8 hrs and PO Minocycline (chronic suppression). End date: 11/24/23.   Received page from pt's wife tonight reporting pt experiencing increased abdominal pain, shortness of breath with productive cough (white sputum), and low grade fever. Wife states she was sick one day last week but stayed away from pt at that time. Pt experiences chronic abdominal pain (large ventral hernia/chronic driveline infection). He states pain is acutely worse than baseline. Discharged home from recent hospitalization with PRN PO Dilaudid and instructed to follow up with pain clinic. Pt missed this appt and ran out of PRN pain medication. Has been managing pain since end of last week with Tramadol.   Plan for sepsis workup. Spoke with Dr Madilyn Hook regarding plan for pt and provided her with Dr Nyoka Cowden phone number and VAD pager # [phone].    Vital signs: Temp: 100.8 rectally HR: 120 Doppler MAP: 118 Automated BP: 110/90 (98) O2 Sat: 100% RA  LVAD interrogation reveals:  Speed: 5300 Flow: 4.1 Power: 3.9 w  PI: 4.0  Alarms: none  Drive Line: Maintained daily by pt's wife  Significant Events with LVAD:   Updated VAD Providers (Dr Gasper Lloyd) about the above. No LVAD issues and pump is functioning as expected. Able to independently manage LVAD equipment. No LVAD needs at this time.    Alyce Pagan RN VAD Coordinator  Office: [phone]  24/7 Pager: [phone]

## 2023-11-05 NOTE — ED Notes (Signed)
Patient transported to CT 

## 2023-11-05 NOTE — ED Notes (Signed)
Patient refused meropenen. Patent states "I no longer want to antibiotics, they are not working." [employer], PA through secure chat.

## 2023-11-05 NOTE — Progress Notes (Signed)
Brief ID Note:   Kevin Underwood is well known to our team. Consult received for admission and assistance with any medical recommendations for infection. Admitted for abdominal pain acute / chronic and fevers. Escalated over the last few days. He has been on IV meropenem and PO minocycline since D/C home 11/8 from last hospitalization via PICC.   Leukocytosis observed on labs. CT scan reviewed and he has new splenic infarct. INRs have been therapeutic as he is on chronic warfarin for [facility] of pump. 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 He would be at risk for further septic emboli to CNS, gut, kidney, etc.   I think his wishes to pursue comfort measures are appropriate given his poor quality of life related to this chronic infection that ascended/progressed despite wound care and antibiotics alone.   D/W Amy Clegg and Dr. Thedore Mins.  Will not see the patient   Rexene Alberts, MSN, NP-C [facility]  [email or web address] Pager: [phone] Office: [phone] [facility] Main Line: [phone] *Secure Chat Communication Welcome

## 2023-11-05 NOTE — Sepsis Progress Note (Signed)
Following for sepsis monitoring ?

## 2023-11-05 NOTE — H&P (Addendum)
Advanced Heart Failure VAD History and Physical Note   PCP-Cardiologist: Nona Dell, MD   Reason for Admission: Sepsis  HPI:   Kevin Underwood. is a 62 y.o. 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 type 2 diabetes mellitus, COPD, and hypertension. 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   Has history of chronic driveline infections. In July 2024 wound cultures grew MSSA and Pseudomonas. Completed long course of IV abx.    Admitted 09/08/23 for possible early sepsis. Was placed on IV antibiotics. CT abd/pelvis unchanged from previous. Blood cultures negative. CT surgery consulted. Driveline wound was improving. Pan to continue conservative wound care. Viral source felt to be the culprit.    Admitted from VAD clinic 10/15 for severe abdominal pain at abdominal hernia and N/V with elevated lactic acid and WBC concerning for infection. CT A/P with no evidence of ischemic bowel, obstruction or worsening driveline infection. He was seen by TCTS and ID. Was treated with IV abx and discharge with minocycline and cipro. Stay was prolonged by uncontrolled that was improved at discharge with pain clinic follow up.   Presented 10/08/23 for shortness of breath and productive cough with pleuritic chest pain. Labs on admission notable for WBC 15.4, Cr 2.22, procal 53.9. CT Abd negative for acute findings. BCx grew Pseudomonas, unfortunately Cipro resistant thus no PO abx suppressive options. ID consulted. IV Abx switched to meropenum. Minocycline continued for acinetobacter. Repeat BCx from 11/4 remained negative. Got single lumen PICC for continuation of home meropenum. Discharged home on 11/08.   Presented to the ED early this am with complaints of worsening abdominal pain at driveline and hernia sites, productive  cough and low grade fever. Pain significantly worse than baseline X 1-2 days. Also notes difficulty bearing weight on left foot (reports this has been a chronic intermittent problem after a crush injury). He missed appointment with pain clinic and ran out of PRN PO dilaudid. He has been using Tramadol over the last week. Labs with WBC 14K, Hgb 7.9,  Na 130, Cr 0.90, K 3.4, lactic acid WNL X 2, INR 2.0, LDH okay. No acute changes on CXR and Xray of ankle. CT A/P w/ stable soft tissue thickening around driveline and large ventral hernia, new splenic infarct noted.  He was given IV merrem and IV vancomycin for possible sepsis. Admission for sepsis and pain control.  LVAD INTERROGATION:  HeartMate III LVAD:  Flow 4.4 liters/min, speed 5650, power 4.1, PI 3.0.  Numerous PI events so far this am.     Home Medications Prior to Admission medications   Medication Sig Start Date End Date Taking? Authorizing Provider  acetaminophen (TYLENOL) 325 MG tablet Take 2 tablets (650 mg total) by mouth every 4 (four) hours as needed for headache or mild pain. 02/20/23   Alen Bleacher, NP  albuterol (PROVENTIL) (2.5 MG/3ML) 0.083% nebulizer solution INHALE 3 ML BY NEBULIZATION EVERY 6 HOURS AS NEEDED FOR WHEEZING OR SHORTNESS OF BREATH 09/10/22   Laurey Morale, MD  albuterol (VENTOLIN HFA) 108 (90 Base) MCG/ACT inhaler Inhale 2 puffs into the lungs every 6 (six) hours as needed for wheezing or shortness of breath.    [provider]  allopurinol (ZYLOPRIM) 100 MG tablet Take 1 tablet (100 mg total) by mouth daily. 12/04/21   Laurey Morale, MD  amLODipine (  NORVASC) 10 MG tablet Take 1 tablet (10 mg total) by mouth daily. 08/16/23   Andrey Farmer, PA-C  colchicine 0.6 MG tablet Take 1 tablet (0.6 mg total) by mouth daily as needed (gout pain). Patient taking differently: Take 0.6 mg by mouth daily. 04/14/23   Laurey Morale, MD  COMBIVENT RESPIMAT 20-100 MCG/ACT AERS respimat Inhale 1 puff into the  lungs every 6 (six) hours. 09/10/22   [provider]  fluticasone Aleda Grana ALLERGY RELIEF) 50 MCG/ACT nasal spray Place 2 sprays into both nostrils as needed for allergies or rhinitis.    [provider]  glipiZIDE (GLUCOTROL) 5 MG tablet Take 1 tablet (5 mg total) by mouth daily. 04/14/23 04/13/24  Laurey Morale, MD  hydrALAZINE (APRESOLINE) 50 MG tablet Take 1 tablet (50 mg total) by mouth every 8 (eight) hours. 10/16/23   Robbie Lis M, PA-C  HYDROmorphone HCl (EXALGO) 8 MG TB24 Take 1 tablet (8 mg total) by mouth daily. 10/17/23   Alen Bleacher, NP  lidocaine (LIDODERM) 5 % Place 1 patch onto the skin daily. Remove & Discard patch within 12 hours or as directed by MD 10/02/23   Alen Bleacher, NP  losartan (COZAAR) 25 MG tablet Take 0.5 tablets (12.5 mg total) by mouth daily. 10/17/23   Robbie Lis M, PA-C  Menthol, Topical Analgesic, (BIOFREEZE) 10 % CREA Apply 1 Application topically 2 (two) times daily as needed (Apply to all joints for pain).    [provider]  meropenem (MERREM) IVPB Inject 1 g into the vein every 8 (eight) hours. Indication:  Pseudomonas bacteremia in setting of LVAD First Dose: Yes Last Day of Therapy:  11/24/23 Labs - Once weekly:  CBC/D and BMP, Labs - Once weekly: ESR and CRP Method of administration: Mini-Bag Plus / Gravity Method of administration may be changed at the discretion of home infusion pharmacist based upon assessment of the patient and/or caregiver's ability to self-administer the medication ordered. 10/14/23 11/24/23  Lee, [place], NP  metFORMIN (GLUCOPHAGE) 1000 MG tablet Take 1 tablet (1,000 mg total) by mouth 2 (two) times daily. 04/14/23   Laurey Morale, MD  metoprolol succinate (TOPROL XL) 25 MG 24 hr tablet Take 1 tablet (25 mg total) by mouth daily. 08/16/23   Andrey Farmer, PA-C  minocycline (MINOCIN) 100 MG capsule Take 2 capsules (200 mg total) by mouth 2 (two) times daily. 10/31/23   Laurey Morale, MD   Multiple Vitamin (MULTIVITAMIN WITH MINERALS) TABS tablet Take 1 tablet by mouth daily. 02/20/23   Alen Bleacher, NP  pantoprazole (PROTONIX) 40 MG tablet Take 1 tablet (40 mg total) by mouth daily. 04/14/23   Laurey Morale, MD  rosuvastatin (CRESTOR) 20 MG tablet Take 1 tablet (20 mg total) by mouth daily. 08/15/23   Andrey Farmer, PA-C  senna-docusate (SENOKOT-S) 8.6-50 MG tablet Take 2 tablets by mouth at bedtime. 10/02/23   Alen Bleacher, NP  sertraline (ZOLOFT) 50 MG tablet Take 1 tablet (50 mg total) by mouth daily. Patient taking differently: Take 50 mg by mouth at bedtime. 04/14/23   Laurey Morale, MD  tamsulosin (FLOMAX) 0.4 MG CAPS capsule Take 1 capsule (0.4 mg total) by mouth daily. 04/14/23   Laurey Morale, MD  thiamine (VITAMIN B-1) 100 MG tablet Take 1 tablet (100 mg total) by mouth daily. Patient not taking: Reported on 10/09/2023 02/20/23   Alen Bleacher, NP  traMADol (ULTRAM) 50 MG tablet Take 1 tablet (50  mg total) by mouth every 6 (six) hours as needed. 11/01/23   Laurey Morale, MD  TRELEGY ELLIPTA 100-62.5-25 MCG/ACT AEPB Inhale 1 puff into the lungs daily. 04/14/23   Laurey Morale, MD  warfarin (COUMADIN) 4 MG tablet Take 1-1.5 tablets (4-6 mg total) by mouth See admin instructions. Take 1.5 tablets (6 mg) every Wednesday, Take 1 tablet (4 mg) all other days or as directed by Heart Failure clinic 10/02/23   Alen Bleacher, NP  zinc gluconate 50 MG tablet Take 50 mg by mouth daily.    [provider]    Past Medical History: Past Medical History:  Diagnosis Date   "    Arthritis    CAD (coronary artery disease)    a. s/p CABG in 11/2019 with LIMA-LAD, SVG-OM1, SVG-PDA and SVG-D1   CHF (congestive heart failure) (HCC)    a. EF < 20% by echo in 11/2019   Essential hypertension    PAF (paroxysmal atrial fibrillation) (HCC)    Type 2 diabetes mellitus (HCC)     Past Surgical History: Past Surgical History:  Procedure Laterality Date   APPLICATION OF  WOUND VAC Left 06/14/2022   Procedure: APPLICATION OF WOUND VAC;  Surgeon: Lovett Sox, MD;  Location: [facility];  Service: Thoracic;  Laterality: Left;   APPLICATION OF WOUND VAC N/A 06/20/2022   Procedure: VAD POWERCORD TUNNEL WOUND VAC CHANGE;  Surgeon: Lovett Sox, MD;  Location: [facility];  Service: Thoracic;  Laterality: N/A;   APPLICATION OF WOUND VAC N/A 06/27/2022   Procedure: WOUND VAC CHANGE;  Surgeon: Lovett Sox, MD;  Location: [facility];  Service: Thoracic;  Laterality: N/A;   APPLICATION OF WOUND VAC N/A 07/08/2022   Procedure: WOUND VAC CHANGE;  Surgeon: Lovett Sox, MD;  Location: [facility];  Service: Thoracic;  Laterality: N/A;   APPLICATION OF WOUND VAC N/A 01/28/2023   Procedure: APPLICATION OF WOUND VAC;  Surgeon: Lovett Sox, MD;  [facility]: [facility];  Service: Thoracic;  Laterality: N/A;   APPLICATION OF WOUND VAC N/A 02/05/2023   Procedure: WOUND VAC CHANGE;  Surgeon: Lovett Sox, MD;  [facility]: [facility];  Service: Thoracic;  Laterality: N/A;   APPLICATION OF WOUND VAC N/A 02/12/2023   Procedure: REMOVAL OF WOUND VAC DRESSING;  Surgeon: Lovett Sox, MD;  [facility];  Service: Thoracic;  Laterality: N/A;   APPLICATION OF WOUND VAC N/A 07/09/2023   Procedure: APPLICATION OF WOUND VAC;  Surgeon: Lovett Sox, MD;  [facility]: [facility];  Service: Thoracic;  Laterality: N/A;   BACK SURGERY     CLIPPING OF ATRIAL APPENDAGE N/A 11/26/2019   Procedure: Clipping Of Atrial Appendage using AtriCure 40 Clip;  Surgeon: [name], MD;  Location: [facility];  Service: Open Heart Surgery;  Laterality: N/A;   CORONARY ARTERY BYPASS GRAFT N/A 11/26/2019   Procedure: CORONARY ARTERY BYPASS GRAFTING (CABG) using endoscopic greater saphenous vein harvest: svc to OM; svc to Diag; svc to PD; and LIMA to LAD.;  Surgeon: [name], MD;  Location: [facility];  Service: Open Heart Surgery;  Laterality: N/A;   FOOT SURGERY     HAND SURGERY     INSERTION OF IMPLANTABLE LEFT VENTRICULAR ASSIST  DEVICE N/A 11/17/2020   Procedure: INSERTION OF IMPLANTABLE LEFT VENTRICULAR ASSIST DEVICE - HM3;  Surgeon: [name], MD;  [facility]: [facility];  Service: Open Heart Surgery;  Laterality: N/A;   INTRAOPERATIVE TRANSESOPHAGEAL ECHOCARDIOGRAM  12/04/2019   Procedure: Intraoperative Transesophageal Echocardiogram;  Surgeon: Vickey Sages,  Merri Brunette, MD;  Location: [facility];  Service: Open Heart Surgery;;   MAZE N/A 11/26/2019   Procedure: MAZE using Bilateral Pulmonary Vein isolation.;  Surgeon: [name], MD;  [facility];  Service: Open Heart Surgery;  Laterality: N/A;   MULTIPLE EXTRACTIONS WITH ALVEOLOPLASTY N/A 11/16/2020   Procedure: MULTIPLE EXTRACTION WITH ALVEOLOPLASTY;  Surgeon: Sharman Cheek, DMD;  Location: [facility];  Service: Dentistry;  Laterality: N/A;   PLACEMENT OF IMPELLA LEFT VENTRICULAR ASSIST DEVICE N/A 11/24/2019   Procedure: PLACEMENT OF IMPELLA 5.5 LEFT VENTRICULAR ASSIST DEVICE;  Surgeon: [name], MD;  [facility];  Service: Open Heart Surgery;  Laterality: N/A;   PLACEMENT OF IMPELLA LEFT VENTRICULAR ASSIST DEVICE Left 11/10/2020   Procedure: PLACEMENT OF IMPELLA 5.5 LEFT VENTRICULAR ASSIST DEVICE VIA  LEFT AVILLARY ARTERY;  Surgeon: [name], MD;  [facility];  Service: Open Heart Surgery;  Laterality: Left;   REMOVAL OF IMPELLA LEFT VENTRICULAR ASSIST DEVICE Right 12/04/2019   Procedure: REMOVAL OF IMPELLA LEFT VENTRICULAR ASSIST DEVICE, right axilla;  Surgeon: [name], MD;  Location: [facility] OR;  Service: Open Heart Surgery;  Laterality: Right;   RIGHT/LEFT HEART CATH AND CORONARY ANGIOGRAPHY N/A 11/24/2019   Procedure: RIGHT/LEFT HEART CATH AND CORONARY ANGIOGRAPHY;  Surgeon: Laurey Morale, MD;  Location: [facility] INVASIVE CV LAB;  Service: Cardiovascular;  Laterality: N/A;   RIGHT/LEFT HEART CATH AND CORONARY/GRAFT ANGIOGRAPHY N/A 11/09/2020   Procedure: RIGHT/LEFT HEART CATH AND CORONARY/GRAFT ANGIOGRAPHY;  Surgeon: [name], MD;   Location: [facility];  Service: Cardiovascular;  Laterality: N/A;   STERNAL WOUND DEBRIDEMENT N/A 05/16/2022   Procedure: VAD DRIVELINE DEBRIDEMENT;  Surgeon: Lovett Sox, MD;  [facility]: [facility];  Service: Thoracic;  Laterality: N/A;  VANC PULSE LAVAGE   STERNAL WOUND DEBRIDEMENT Left 06/14/2022   Procedure: DEBRIDEMENT OF VAD POWERCORD TUNNEL;  Surgeon: Lovett Sox, MD;  [facility]: [facility];  Service: Thoracic;  Laterality: Left;   STERNAL WOUND DEBRIDEMENT N/A 06/27/2022   Procedure: ABDOMINAL WOUND DEBRIDEMENT, APPLICATION OF MYRIAD;  Surgeon: Lovett Sox, MD;  [facility];  Service: Thoracic;  Laterality: N/A;   STERNAL WOUND DEBRIDEMENT N/A 07/08/2022   Procedure: ABDOMINAL WOUND DEBRIDEMENT;  Surgeon: Lovett Sox, MD;  [facility];  Service: Thoracic;  Laterality: N/A;   STERNAL WOUND DEBRIDEMENT N/A 01/28/2023   Procedure: ABDOMINAL DRIVELINE WOUND DEBRIDEMENT;  Surgeon: Lovett Sox, MD;  [facility];  Service: Thoracic;  Laterality: N/A;   STERNAL WOUND DEBRIDEMENT N/A 02/05/2023   Procedure: ABDOMINAL DRIVELINE WOUND DEBRIDEMENT;  Surgeon: Lovett Sox, MD;  [facility];  Service: Thoracic;  Laterality: N/A;   STERNAL WOUND DEBRIDEMENT N/A 02/12/2023   Procedure: ABDOMINAL DRIVELINE WOUND DEBRIDEMENT AND APPLICATION OF MATRIX WOUND DRESSING;  Surgeon: Lovett Sox, MD;  [facility];  Service: Thoracic;  Laterality: N/A;   STERNAL WOUND DEBRIDEMENT N/A 07/09/2023   Procedure: DRIVELINE WOUND DEBRIDEMENT;  Surgeon: Lovett Sox, MD;  [facility];  Service: Thoracic;  Laterality: N/A;   STERNAL WOUND DEBRIDEMENT N/A 07/17/2023   Procedure: DRIVELINE WOUND DEBRIDEMENT;  Surgeon: Lovett Sox, MD;  [facility];  Service: Thoracic;  Laterality: N/A;   TEE WITHOUT CARDIOVERSION N/A 11/24/2019   Procedure: TRANSESOPHAGEAL ECHOCARDIOGRAM (TEE);  Surgeon: [name], MD;  Location: [facility] OR;  Service: Open Heart Surgery;  Laterality: N/A;   TEE  WITHOUT CARDIOVERSION N/A 11/26/2019   Procedure: TRANSESOPHAGEAL ECHOCARDIOGRAM (TEE);  Surgeon: [name], MD;  [facility];  Service: Open Heart Surgery;  Laterality: N/A;   TEE WITHOUT CARDIOVERSION N/A 11/10/2020   Procedure: TRANSESOPHAGEAL ECHOCARDIOGRAM (TEE);  Surgeon: [name], MD;  Location: [facility] OR;  Service: Open Heart Surgery;  Laterality: N/A;   TEE WITHOUT CARDIOVERSION N/A 11/17/2020   Procedure: TRANSESOPHAGEAL ECHOCARDIOGRAM (TEE);  Surgeon: [name], MD;  Location: [facility] OR;  Service: Open Heart Surgery;  Laterality: N/A;   WOUND EXPLORATION N/A 05/31/2022   Procedure: WOUND EXPLORATION, Debridement and Irrigation of abdominal wound;  Surgeon: Lovett Sox, MD;  [facility];  Service: Thoracic;  Laterality: N/A;    Family History: Family History  Problem Relation Age of Onset   Heart disease Sister        Tumor?   Arthritis Other    Lung disease Other    Asthma Other    Diabetes Other    Thyroid disease Neg Hx     Social History: Social History   Socioeconomic History   Marital status: Married    Spouse name: Not on file   Number of children: Not on file   Years of education: 12   Highest education level: Not on file  Occupational History   Not on file  Tobacco Use   Smoking status: Former    Current packs/day: 0.00    Average packs/day: 1 pack/day for 30.0 years (30.0 ttl pk-yrs)    Types: Cigarettes    Start date: 12/19/1989    Quit date: 12/20/2019    Years since quitting: 3.8   Smokeless tobacco: Never  Vaping Use   Vaping status: Never Used  Substance and Sexual Activity   Alcohol use: Not Currently    Comment: Prior history of excessive intake   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 He denies any issues with walking or talking as a child. He completed the 9th  grade. He is currently financially supported through disability. He is married and he has four children he is estranged from. He is currently living with his wife.    Social Determinants of Health   Financial Resource Strain: High Risk (11/03/2023)   Overall Financial Resource Strain (CARDIA)    Difficulty of Paying Living Expenses: Very hard  Food Insecurity: No Food Insecurity (10/09/2023)   Hunger Vital Sign    Worried About Running Out of Food in the Last Year: Never true    Ran Out of Food in the Last Year: Never true  Transportation Needs: No Transportation Needs (10/09/2023)   PRAPARE - Administrator, Civil Service (Medical): No    Lack of Transportation (Non-Medical): No  Recent Concern: Transportation Needs - Unmet Transportation Needs (09/08/2023)   PRAPARE - Transportation    Lack of Transportation (Medical): Yes    Lack of Transportation (Non-Medical): Yes  Physical Activity: Inactive (11/10/2020)   Exercise Vital Sign    Days of Exercise per Week: 0 days    Minutes of Exercise per Session: 0 min  Stress: Not on file  Social Connections: Not on file    Allergies:  Allergies  Allergen Reactions   Firvanq [Vancomycin] Nausea And Vomiting and Other (See Comments)    Arthralgias     Objective:    Vital Signs:   Temp:  [98.9 F (37.2 C)-100.4 F (38 C)] 98.9 F (37.2 C) (11/27  0500) Pulse Rate:  [60-117] 96 (11/27 0630) Resp:  [15-26] 15 (11/27 0630) BP: (90-128)/(60-98) 90/78 (11/27 0630) SpO2:  [94 %-100 %] 96 % (11/27 0630) Weight:  [85.3 kg] 85.3 kg (11/27 0032)   Filed Weights   11/05/23 0032  Weight: 85.3 kg    Mean arterial Pressure 80s-90s  Physical Exam    General:  Acute on chronically ill appearing.  HEENT: Normal Neck: supple. JVP not elevated.  Cor: Mechanical heart sounds with LVAD hum present. Lungs: Clear Abdomen: large ventral hernia Driveline: C/D/I; securement device intact and driveline incorporated Extremities: no  cyanosis, clubbing, rash, edema, tenderness around left medial malleolus Neuro: alert & orientedx3. Affect anxious.   Telemetry   SR/ST 90s-100s, PVCs and PACs  EKG   Sinus tach 111 bpm  Labs    Basic Metabolic Panel: Recent Labs  Lab 11/05/23 0021 11/05/23 0029  NA 130* 136  K 3.4* 3.5  CL 101 102  CO2 20*  --   GLUCOSE 109* 104*  BUN 13 12  CREATININE 0.90 1.00  CALCIUM 8.0*  --     Liver Function Tests: Recent Labs  Lab 11/05/23 0021  AST 30  ALT 25  ALKPHOS 125  BILITOT 0.6  PROT 5.8*  ALBUMIN 2.1*   Recent Labs  Lab 11/05/23 0021  LIPASE 56*   No results for input(s): "AMMONIA" in the last 168 hours.  CBC: Recent Labs  Lab 11/05/23 0021 11/05/23 0029  WBC 14.1*  --   NEUTROABS 11.8*  --   HGB 7.9* 8.2*  HCT 24.5* 24.0*  MCV 76.3*  --   PLT 252  --     Cardiac Enzymes: No results for input(s): "CKTOTAL", "CKMB", "CKMBINDEX", "TROPONINI" in the last 168 hours.  BNP: BNP (last 3 results) Recent Labs    09/08/23 1940 10/08/23 2348  BNP 57.1 248.5*    ProBNP (last 3 results) No results for input(s): "PROBNP" in the last 8760 hours.   CBG: No results for input(s): "GLUCAP" in the last 168 hours.  Coagulation Studies: Recent Labs    11/05/23 0021  LABPROT 22.7*  INR 2.0*     Imaging    CT CHEST ABDOMEN PELVIS W CONTRAST  Result Date: 11/05/2023 CLINICAL DATA:  Sepsis. 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 Progressive abdominal pain. EXAM: CT CHEST, ABDOMEN, AND PELVIS WITH CONTRAST TECHNIQUE: Multidetector CT imaging of the chest, abdomen and pelvis was performed following the standard protocol during bolus administration of intravenous contrast. RADIATION DOSE REDUCTION: This exam was performed according to the departmental dose-optimization program which includes automated exposure control, adjustment of the mA and/or kV according to patient size and/or use of iterative reconstruction technique. CONTRAST:   75mL OMNIPAQUE IOHEXOL 350 MG/ML SOLN COMPARISON:  10/09/2023 FINDINGS: CT CHEST FINDINGS Cardiovascular: Coronary artery bypass grafting, left atrial clipping, and left ventricular assist device insertion have been performed. Outflow cannula and aortic anastomosis are widely patent. There is stable soft tissue thickening seen surrounding the drive line as it enters the epigastrium and tracking to the left ventricular assist device is self, unchanged from prior examination. Cardiac size is mildly enlarged, unchanged. Central pulmonary arteries are enlarged in keeping with changes of pulmonary arterial hypertension. Moderate atherosclerotic calcification is seen within the thoracic aorta. No aortic aneurysm. Right upper extremity PICC line tip is seen within the superior vena cava. Mediastinum/Nodes: No enlarged mediastinal, hilar, or axillary lymph nodes. Thyroid gland, trachea, and esophagus demonstrate no significant findings. Lungs/Pleura: Moderate emphysema. Mild bibasilar atelectasis or  scarring. Stable bronchial wall thickening in keeping with chronic airway inflammation. No confluent pulmonary infiltrate. No pneumothorax or pleural effusion. Musculoskeletal: No chest wall mass or suspicious bone lesions identified. CT ABDOMEN PELVIS FINDINGS Hepatobiliary: No focal liver abnormality is seen. No gallstones, gallbladder wall thickening, or biliary dilatation. 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 Adrenals/Urinary Tract: The adrenal glands are unremarkable. The kidneys are normal in size and position. 2 mm nonobstructing calculus noted within the upper pole of the left kidney. The kidneys are otherwise unremarkable. Bladder is unremarkable Stomach/Bowel: Large broad-based ventral hernia contains multiple loops of unremarkable small bowel as well as the mid transverse colon. The stomach, small bowel, and  large bowel are otherwise unremarkable. Appendix normal. No free intraperitoneal gas or fluid Vascular/Lymphatic: Extensive aortoiliac atherosclerotic calcification. No aortic aneurysm. No pathologic adenopathy the abdomen and pelvis. Reproductive: Prostate is unremarkable. Other: Small fat containing bilateral hernias again Musculoskeletal: L4-S1 posterior lumbar fusion with instrumentation with posterior decompression L4 and L5. No acute bone abnormality no lytic or blastic bone lesion. IMPRESSION: 1. 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 2. Stable soft tissue thickening surrounding the drive line as it enters the epigastrium and tracking to the left ventricular assist device itself, unchanged from prior examination. 3. Moderate emphysema. Stable bronchial wall thickening in keeping with chronic airway inflammation. 4. Minimal left nonobstructing nephrolithiasis. 5. Large broad-based ventral hernia containing multiple loops of unremarkable small bowel as well as the mid transverse colon. Aortic Atherosclerosis (ICD10-I70.0) and Emphysema (ICD10-J43.9). Electronically Signed   By: Helyn Numbers M.D.   On: 11/05/2023 03:18   DG Ankle Complete Left  Result Date: 11/05/2023 CLINICAL DATA:  Pain possible fall EXAM: LEFT ANKLE COMPLETE - 3+ VIEW COMPARISON:  04/29/2009 FINDINGS: No definite acute displaced fracture or malalignment is seen. Advanced tibiotalar degenerative changes with joint space narrowing, erosive changes, subarticular sclerosis and cyst formation. Probable ankle effusion. Vascular calcifications. IMPRESSION: 1. No definite fracture identified allowing for advanced degenerative change 2. 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 Electronically Signed   By: Jasmine Pang M.D.   On: 11/05/2023 01:44   DG Chest Port 1 View  Result Date: 11/05/2023 CLINICAL DATA:  Possible sepsis EXAM:  PORTABLE CHEST 1 VIEW COMPARISON:  10/08/2023 FINDINGS: Post sternotomy changes and left atrial appendage clip. LVAD partially visualized. No focal opacity, pleural effusion or pneumothorax. Right upper extremity central venous catheter tip over the SVC. Stable cardiomediastinal silhouette with aortic atherosclerosis. IMPRESSION: No active disease. Electronically Signed   By: Jasmine Pang M.D.   On: 11/05/2023 01:39      Patient Profile:   62 y.o. male with history of chronic systolic CHF/ICM s/p HM III VAD, hx recurrent driveline infection, CAD, chronic abdominal pain, large abdominal hernia, PAF, DM II, anemia. Admitted with worsening abdominal pain and concern for sepsis.  Assessment/Plan:   Possible sepsis -Presenting with acutely worsening abdominal pain at driveline and hernia sites, productive cough and dyspnea. -Notes low grade temp 100F and chills at home. Rectal T 100.35F. WBCs 14K. Blood cultures obtained. -CT A/P w/ stable soft tissue thickening around driveline and large ventral hernia, new splenic infarct noted. His INR is 2. ? Septic emboli. -Ran out of PRN dilaudid at home and taking Tramadol the last week. -Minimal pain relief with fentanyl. Had better response to dilaudid last admit.  Ordered 0.5 mg dilaudid q 4hrs PRN. -PO tramadol 50 mg q 6hrs PRN -Received IV merrem and IV vancomycin overnight. Discussed with ID who will see patient in consult. Appreciate assistance.  2. Recurrent driveline infection: Multiple DL infection. Cultures have grown acinetobacter, MRSA and pseudomonas Discharged from the hospital on 09/04/2023 with recurrent driveline infection growing Pseudomonas. Wound culture 10/1 grew MRSA. Readmitted 10/30 Bcx 2/2 Pseudomonas.  - Remains AF on IV abx - PICC discontinued 11/4 at request of ID, as it was placed prior to cleared blood culture. Single lumen PICC subsequently placed for home abx - On IV merrem for pseudomonas (no po options) and minocycline for  acinetobacter at home. Reports adherence with antibiotics. IV vanc started overnight. - BC pending - ID consult as above  3.  Chronic systolic CHF s/p Heartmate 3 LVAD on 11/17/20. Echo 10/11/23 EF 40-45% admit LVAD cannula in apex. - INR goal 2 - 2.5. INR 2.0. Warfarin dosing per PharmD. - Slowly add back home BP meds. Softer MAPs with IV pain meds + concern for sepsis  4.  CAD S/p CABG 12/20 - Continue Crestor 20 mg daily.  - no s/s angina  5.  Paroxysmal atrial fibrillation status post maze and left atrial appendage clip. NSR this admission. - Off amiodarone with hyperthyroidism.  6. Type 2 diabetes - SSI  7. Hx methamphetamine/polysubstance abuse: No recent use  8. Hx Hyperthyroidism:  Likely related to amiodarone. He is now off amiodarone and methimazole.   9. Anemia (chronic blood loss): stable - Hgb has been trending in 9s. 7.9 early am. Recheck CBC this am.  - Will need transfusion if trends down further.  10. COPD: Prior smoker.  - Home inhalers/nebs ordered  11. Ventral hernia: Large ventral hernia, soft and nontender.  Does not appear incarcerated.  No bowel obstruction on abdominal CT this am.  Not candidate for surgery due to proximity of hernia to driveline tunnel. Intolerant of abdominal binder.      ADDENDUM:  Met with patient, HF CSW and VAD coordinator at bedside. Patient is miserable and notes poor quality of life over the last couple of months. He made it clear that he wants to transition to comfort care. He will require inpatient admission to manage his pain. Code status updated. Will arrange for family to visit with him. Urgent palliative care consult placed.  I reviewed the LVAD parameters from today, and compared the results to the patient's prior recorded data.  No programming changes were made.  The LVAD is functioning within specified parameters.  The patient performs LVAD self-test daily.  LVAD interrogation was negative for any significant power  changes, alarms or PI events/speed drops.  LVAD equipment check completed and is in good working order.  Back-up equipment present.   LVAD education done on emergency procedures and precautions and reviewed exit site care.  Length of Stay: 0  Lourdes Manning N, PA-C 11/05/2023, 7:06 AM  VAD Team Pager [phone] (7am - 7am) +++VAD ISSUES ONLY+++   Advanced Heart Failure Team Pager [phone] (M-F; 7a - 5p)  Please contact [facility] for night-coverage after hours (5p -7a ) and weekends on [email or web address] for all non- LVAD Issues

## 2023-11-05 NOTE — ED Notes (Signed)
Notified Sabharwal, MD  of BP  82/65  &  Map of 75 after pain meds were given.

## 2023-11-06 DIAGNOSIS — T827XXA Infection and inflammatory reaction due to other cardiac and vascular devices, implants and grafts, initial encounter: Secondary | ICD-10-CM | POA: Diagnosis not present

## 2023-11-06 DIAGNOSIS — R1012 Left upper quadrant pain: Secondary | ICD-10-CM

## 2023-11-06 DIAGNOSIS — Z515 Encounter for palliative care: Secondary | ICD-10-CM | POA: Diagnosis not present

## 2023-11-06 LAB — BLOOD CULTURE ID PANEL (REFLEXED) - BCID2

## 2023-11-06 MED ORDER — TAMSULOSIN HCL 0.4 MG PO CAPS
0.4000 mg | ORAL_CAPSULE | Freq: Every day | ORAL | Status: DC
  Administered 2023-11-06 – 2023-11-07 (×2): 0.4 mg via ORAL
  Filled 2023-11-06 (×3): qty 1

## 2023-11-06 MED ORDER — LORAZEPAM 2 MG/ML IJ SOLN
1.0000 mg | INTRAMUSCULAR | Status: DC | PRN
  Administered 2023-11-06 – 2023-11-08 (×5): 1 mg via INTRAVENOUS
  Filled 2023-11-06 (×5): qty 1

## 2023-11-06 MED ORDER — MUSCLE RUB 10-15 % EX CREA
TOPICAL_CREAM | CUTANEOUS | Status: DC | PRN
  Filled 2023-11-06: qty 85

## 2023-11-06 NOTE — Progress Notes (Signed)
Patient ID: Kevin Rigg., male   DOB: 08-Dec-1961, 62 y.o.   MRN: [id number]   Advanced Heart Failure VAD Team Note  PCP-Cardiologist: Nona Dell, MD   Subjective:    Patient is very comfortable this morning with Dilaudid PCA.  No complaints.  Excited to see family.   Pseudomonas on blood cultures.  Currently on comfort care, no antbiotics and not drawing labs.   LVAD INTERROGATION:  HeartMate 3 LVAD:   Flow 4.2 liters/min, speed 5600, power 4, PI 3.2.    Objective:    Vital Signs:   Temp:  [98 F (36.7 C)-98.2 F (36.8 C)] 98.2 F (36.8 C) (11/27 1952) Pulse Rate:  [56-117] 89 (11/27 1207) Resp:  [14-20] 18 (11/28 0438) BP: (95-99)/(66-83) 99/74 (11/27 1207) SpO2:  [92 %-99 %] 92 % (11/28 0438) FiO2 (%):  [30 %-31 %] 30 % (11/28 0438) Weight:  [90.1 kg] 90.1 kg (11/27 1207) Last BM Date : 10/15/23 Mean arterial Pressure 80  Intake/Output:   Intake/Output Summary (Last 24 hours) at 11/06/2023 0704 Last data filed at 11/05/2023 1207 Gross per 24 hour  Intake --  Output 480 ml  Net -480 ml     Physical Exam    General: Well appearing this am. NAD.  HEENT: Normal. Neck: Supple, JVP 7-8 cm. Carotids OK.  Cardiac:  Mechanical heart sounds with LVAD hum present.  Lungs:  CTAB, normal effort.  Abdomen:  NT, ND, no HSM. Large ventral hernia. +BS  LVAD exit site: Well-healed and incorporated. Dressing dry and intact. No erythema or drainage. Stabilization device present and accurately applied. Driveline dressing changed daily per sterile technique. Extremities:  Warm and dry. No cyanosis, clubbing, rash, or edema.  Neuro:  Alert & oriented x 3. Cranial nerves grossly intact. Moves all 4 extremities w/o difficulty. Affect pleasant     Telemetry   NSR 90s (personally reviewed)  Labs   Basic Metabolic Panel: Recent Labs  Lab 11/05/23 0021 11/05/23 0029  NA 130* 136  K 3.4* 3.5  CL 101 102  CO2 20*  --   GLUCOSE 109* 104*  BUN 13 12  CREATININE  0.90 1.00  CALCIUM 8.0*  --     Liver Function Tests: Recent Labs  Lab 11/05/23 0021  AST 30  ALT 25  ALKPHOS 125  BILITOT 0.6  PROT 5.8*  ALBUMIN 2.1*   Recent Labs  Lab 11/05/23 0021  LIPASE 56*   No results for input(s): "AMMONIA" in the last 168 hours.  CBC: Recent Labs  Lab 11/05/23 0021 11/05/23 0029  WBC 14.1*  --   NEUTROABS 11.8*  --   HGB 7.9* 8.2*  HCT 24.5* 24.0*  MCV 76.3*  --   PLT 252  --     INR: Recent Labs  Lab 11/05/23 0021  INR 2.0*    Other results: EKG:    Imaging   CT CHEST ABDOMEN PELVIS W CONTRAST  Result Date: 11/05/2023 CLINICAL DATA:  Sepsis. 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 Progressive abdominal pain. EXAM: CT CHEST, ABDOMEN, AND PELVIS WITH CONTRAST TECHNIQUE: Multidetector CT imaging of the chest, abdomen and pelvis was performed following the standard protocol during bolus administration of intravenous contrast. RADIATION DOSE REDUCTION: This exam was performed according to the departmental dose-optimization program which includes automated exposure control, adjustment of the mA and/or kV according to patient size and/or use of iterative reconstruction technique. CONTRAST:  75mL OMNIPAQUE IOHEXOL 350 MG/ML SOLN COMPARISON:  10/09/2023 FINDINGS: CT CHEST FINDINGS Cardiovascular: Coronary  artery bypass grafting, left atrial clipping, and left ventricular assist device insertion have been performed. Outflow cannula and aortic anastomosis are widely patent. There is stable soft tissue thickening seen surrounding the drive line as it enters the epigastrium and tracking to the left ventricular assist device is self, unchanged from prior examination. Cardiac size is mildly enlarged, unchanged. Central pulmonary arteries are enlarged in keeping with changes of pulmonary arterial hypertension. Moderate atherosclerotic calcification is seen within the thoracic aorta. No aortic aneurysm. Right upper extremity PICC line tip is  seen within the superior vena cava. Mediastinum/Nodes: No enlarged mediastinal, hilar, or axillary lymph nodes. Thyroid gland, trachea, and esophagus demonstrate no significant findings. Lungs/Pleura: Moderate emphysema. Mild bibasilar atelectasis or scarring. Stable bronchial wall thickening in keeping with chronic airway inflammation. No confluent pulmonary infiltrate. No pneumothorax or pleural effusion. Musculoskeletal: No chest wall mass or suspicious bone lesions identified. CT ABDOMEN PELVIS FINDINGS Hepatobiliary: No focal liver abnormality is seen. No gallstones, gallbladder wall thickening, or biliary dilatation. 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 Adrenals/Urinary Tract: The adrenal glands are unremarkable. The kidneys are normal in size and position. 2 mm nonobstructing calculus noted within the upper pole of the left kidney. The kidneys are otherwise unremarkable. Bladder is unremarkable Stomach/Bowel: Large broad-based ventral hernia contains multiple loops of unremarkable small bowel as well as the mid transverse colon. The stomach, small bowel, and large bowel are otherwise unremarkable. Appendix normal. No free intraperitoneal gas or fluid Vascular/Lymphatic: Extensive aortoiliac atherosclerotic calcification. No aortic aneurysm. No pathologic adenopathy the abdomen and pelvis. Reproductive: Prostate is unremarkable. Other: Small fat containing bilateral hernias again Musculoskeletal: L4-S1 posterior lumbar fusion with instrumentation with posterior decompression L4 and L5. No acute bone abnormality no lytic or blastic bone lesion. IMPRESSION: 1. 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 2. Stable soft tissue thickening surrounding the drive line as it enters the epigastrium and tracking to the left ventricular assist device itself, unchanged from  prior examination. 3. Moderate emphysema. Stable bronchial wall thickening in keeping with chronic airway inflammation. 4. Minimal left nonobstructing nephrolithiasis. 5. Large broad-based ventral hernia containing multiple loops of unremarkable small bowel as well as the mid transverse colon. Aortic Atherosclerosis (ICD10-I70.0) and Emphysema (ICD10-J43.9). Electronically Signed   By: Helyn Numbers M.D.   On: 11/05/2023 03:18   DG Ankle Complete Left  Result Date: 11/05/2023 CLINICAL DATA:  Pain possible fall EXAM: LEFT ANKLE COMPLETE - 3+ VIEW COMPARISON:  04/29/2009 FINDINGS: No definite acute displaced fracture or malalignment is seen. Advanced tibiotalar degenerative changes with joint space narrowing, erosive changes, subarticular sclerosis and cyst formation. Probable ankle effusion. Vascular calcifications. IMPRESSION: 1. No definite fracture identified allowing for advanced degenerative change 2. 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 Electronically Signed   By: Jasmine Pang M.D.   On: 11/05/2023 01:44   DG Chest Port 1 View  Result Date: 11/05/2023 CLINICAL DATA:  Possible sepsis EXAM: PORTABLE CHEST 1 VIEW COMPARISON:  10/08/2023 FINDINGS: Post sternotomy changes and left atrial appendage clip. LVAD partially visualized. No focal opacity, pleural effusion or pneumothorax. Right upper extremity central venous catheter tip over the SVC. Stable cardiomediastinal silhouette with aortic atherosclerosis. IMPRESSION: No active disease. Electronically Signed   By: Jasmine Pang M.D.   On: 11/05/2023 01:39     Medications:     Scheduled Medications:  HYDROmorphone   Intravenous Q4H    Infusions:   PRN Medications: acetaminophen, antiseptic oral rinse, diphenhydrAMINE **OR** diphenhydrAMINE, glycopyrrolate **OR** glycopyrrolate **OR** glycopyrrolate, haloperidol **OR** haloperidol **OR** haloperidol lactate, ondansetron (ZOFRAN)  IV, mouth rinse, polyvinyl alcohol, traMADol    Assessment/Plan:    Sepsis -Presenting with acutely worsening abdominal pain at driveline and hernia sites, productive cough and dyspnea. -WBCs 14, blood cultures growing Pseudomonas likely from chronic driveline infection and suspect seeding of LVAD with new splenic infarct seen on CT abdomen/pelvis.  -Off antibiotics now with comfort care.   2. Recurrent driveline infection: Multiple DL infection. Cultures have grown acinetobacter, MRSA and pseudomonas Discharged from the hospital on 09/04/2023 with recurrent driveline infection growing Pseudomonas. Wound culture 10/1 grew MRSA. Readmitted 10/30 Bcx 2/2 Pseudomonas. Blood cultures this admission with Pseudomonas despite adherence with home meropenem and minocycline.  - As above, off abx with comfort care.    3.  Chronic systolic CHF s/p Heartmate 3 LVAD on 11/17/20. Echo 10/11/23 EF 40-45% admit LVAD cannula in apex. - Warfarin stopped with comfort care, not checking labs.    4.  CAD S/p CABG 12/20 - No chest pain   5.  Paroxysmal atrial fibrillation status post maze and left atrial appendage clip. NSR this admission. - Off amiodarone with hyperthyroidism.   6. Type 2 diabetes - SSI   7. Hx methamphetamine/polysubstance abuse: No recent use   8. Hx Hyperthyroidism:  Likely related to amiodarone. He is now off amiodarone and methimazole.    9. Anemia (chronic blood loss): stable - Hgb 7.9 last check   10. COPD: Prior smoker.  - Home inhalers/nebs ordered   11. Ventral hernia: Large ventral hernia, not tender today.  Does not appear incarcerated.  No bowel obstruction on abdominal CT this am.  Not candidate for surgery due to proximity of hernia to driveline tunnel. Intolerant of abdominal binder.     12. Pain: Patient has had intractable pain at driveline site at ventral hernia.  Now on Dilaudid PCA with good pain control.   Patient now with comfort care, has been seen by our  palliative care service.  He has had intractable pain and also has an intractable driveline infection with likely seeding of his LVAD (new splenic infarction).  I talked at length with him this morning, he is at peace with his decision to proceed with comfort care.  We anticipate in-hospital death.  Some family will visit today and some will visit tomorrow.  After that, he plans to have his LVAD disconnected with anticipation that he will expire.   I reviewed the LVAD parameters from today, and compared the results to the patient's prior recorded data.  No programming changes were made.  The LVAD is functioning within specified parameters.  The patient performs LVAD self-test daily.  LVAD interrogation was negative for any significant power changes, alarms or PI events/speed drops.  LVAD equipment check completed and is in good working order.  Back-up equipment present.   LVAD education done on emergency procedures and precautions and reviewed exit site care.  Length of Stay: 1  Marca Ancona, MD 11/06/2023, 7:04 AM  VAD Team --- VAD ISSUES ONLY--- Pager [phone] (7am - 7am)  Advanced Heart Failure Team  Pager [phone] (M-F; 7a - 5p)  Please contact [facility] for night-coverage after hours (5p -7a ) and weekends on [email or web address]

## 2023-11-06 NOTE — Progress Notes (Signed)
PHARMACY - PHYSICIAN COMMUNICATION CRITICAL VALUE ALERT - BLOOD CULTURE IDENTIFICATION (BCID)  Kevin Underwood. is an 62 y.o. male who presented to [facility] on 11/04/2023 with a chief complaint of abdominal pain and LVAD driveline infection, now on comfort care  Assessment:   Blood culture growing Pseudomonas aeruginosa  Name of physician (or Provider) Contacted: N/A  Current antibiotics: None  Changes to prescribed antibiotics recommended:  Comfort care--no changes recommended  Results for orders placed or performed during the hospital encounter of 11/04/23  Blood Culture ID Panel (Reflexed) (Collected: 11/05/2023 12:57 AM)  Result Value Ref Range   Enterococcus faecalis NOT DETECTED NOT DETECTED   Enterococcus Faecium NOT DETECTED NOT DETECTED   Listeria monocytogenes NOT DETECTED NOT DETECTED   Staphylococcus species NOT DETECTED NOT DETECTED   Staphylococcus aureus (BCID) NOT DETECTED NOT DETECTED   Staphylococcus epidermidis NOT DETECTED NOT DETECTED   Staphylococcus lugdunensis NOT DETECTED NOT DETECTED   Streptococcus species NOT DETECTED NOT DETECTED   Streptococcus agalactiae NOT DETECTED NOT DETECTED   Streptococcus pneumoniae NOT DETECTED NOT DETECTED   Streptococcus pyogenes NOT DETECTED NOT DETECTED   A.calcoaceticus-baumannii NOT DETECTED NOT DETECTED   Bacteroides fragilis NOT DETECTED NOT DETECTED   Enterobacterales NOT DETECTED NOT DETECTED   Enterobacter cloacae complex NOT DETECTED NOT DETECTED   Escherichia coli NOT DETECTED NOT DETECTED   Klebsiella aerogenes NOT DETECTED NOT DETECTED   Klebsiella oxytoca NOT DETECTED NOT DETECTED   Klebsiella pneumoniae NOT DETECTED NOT DETECTED   Proteus species NOT DETECTED NOT DETECTED   Salmonella species NOT DETECTED NOT DETECTED   Serratia marcescens NOT DETECTED NOT DETECTED   Haemophilus influenzae NOT DETECTED NOT DETECTED   Neisseria meningitidis NOT DETECTED NOT DETECTED   Pseudomonas aeruginosa  DETECTED (A) NOT DETECTED   Stenotrophomonas maltophilia NOT DETECTED NOT DETECTED   Candida albicans NOT DETECTED NOT DETECTED   Candida auris NOT DETECTED NOT DETECTED   Candida glabrata NOT DETECTED NOT DETECTED   Candida krusei NOT DETECTED NOT DETECTED   Candida parapsilosis NOT DETECTED NOT DETECTED   Candida tropicalis NOT DETECTED NOT DETECTED   Cryptococcus neoformans/gattii NOT DETECTED NOT DETECTED   CTX-M ESBL NOT DETECTED NOT DETECTED   Carbapenem resistance IMP NOT DETECTED NOT DETECTED   Carbapenem resistance KPC NOT DETECTED NOT DETECTED   Carbapenem resistance NDM NOT DETECTED NOT DETECTED   Carbapenem resistance VIM NOT DETECTED NOT DETECTED    Eddie Candle 11/06/2023  4:27 AM

## 2023-11-06 NOTE — Progress Notes (Signed)
Daily Progress Note   Patient Name: Kevin Underwood.       Date: 11/06/2023 DOB: [birth year]  Age: 62 y.o. MRN#: [id number] Attending Physician: Dorthula Nettles, DO Primary Care Physician: [name], FNP Admit Date: 11/04/2023  Reason for Consultation/Follow-up: Establishing goals of care  Subjective: Medical records reviewed including progress notes, prior PMT labs, imaging. Patient assessed at the bedside.  He reports overall satisfactory comfort level, though his left foot is becoming increasingly bothersome.  His wife is present visiting. Interrogated PCA pump.  Patient has received 6 self-administered doses of Dilaudid.  With 6 demand attempts over the past 24 hours.  Total dosage of 19.65 mg over the past 24 hours.    Provided education and counseling on the current parameters for PCA, encouraging patient to self administer more frequently as his needs increase.  He understandably does not want to become too drowsy, as his plan is to enjoy his visit with his wife today and then other family members including his mother tomorrow.  We discussed ice/heat for his foot, though this has not worked in the past.  He has found some relief from [employer]  Will order Bengay..  Provided with PMT contact information.  Encouraged to call if additional needs arise.  Received update from RN stating that patient is requesting something for 80, specifically Ativan.  Questions and concerns addressed. PMT will continue to support holistically.   Length of Stay: 1   Physical Exam Vitals and nursing note reviewed.  Constitutional:      General: He is not in acute distress.    Appearance: He is ill-appearing.  Cardiovascular:     Rate and Rhythm: Normal rate.  Pulmonary:     Effort: Pulmonary  effort is normal. No respiratory distress.  Abdominal:     Comments: Large hernia  Neurological:     Mental Status: He is alert.  Psychiatric:        Mood and Affect: Mood normal.        Behavior: Behavior normal.            Vital Signs: BP 99/74 (BP Location: Left Arm)   Pulse 89   Temp 98.2 F (36.8 C) (Oral)   Resp 18   Ht 5\' 10"  (1.778 m)   Wt 90.1 kg   SpO2 92%   BMI 28.50 kg/m  SpO2: SpO2: 92 % O2 Device: O2 Device: Room Air O2 Flow Rate: O2 Flow Rate (L/min): 0 L/min       Palliative Care Assessment & Plan   Patient Profile: 62 y.o. male  with past medical history of systolic HF, 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 type 2 diabetes mellitus, COPD, and hypertension. 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 He has a history of chronic driveline infections and stated poor quality of life for the past several months.  Presented to the ED with worsening abdominal pain at driveline and hernia sites, cough, fever.  He was admitted on 11/04/2023 with possible sepsis, recurrent driveline infection, heart failure, and others.  His primary justification for  admission is for pain management and comfort care at end-of-life including discontinuation of LVAD.   Palliative medicine was consulted for symptom management and end-of-life care.  Assessment: End-of-life care  Recommendations/Plan: Continue DNR/DNI Continue comfort focused care Ordered Ativan 1 mg every 4 hours as needed for anxiety Psychosocial emotional support provided PMT continue to follow and support   Prognosis:  Hours - Days  Discharge Planning: Anticipated Hospital Death  Care plan was discussed with patient, patient's wife, RN   MDM high         Seymone Forlenza Jeni Salles, PA-C  Palliative Medicine Team Team phone # [phone]  Thank you for  allowing the Palliative Medicine Team to assist in the care of this patient. Please utilize secure chat with additional questions, if there is no response within 30 minutes please call the above phone number.  Palliative Medicine Team providers are available by phone from 7am to 7pm daily and can be reached through the team cell phone.  Should this patient require assistance outside of these hours, please call the patient's attending physician.

## 2023-11-07 DIAGNOSIS — Z7189 Other specified counseling: Secondary | ICD-10-CM | POA: Diagnosis not present

## 2023-11-07 DIAGNOSIS — A419 Sepsis, unspecified organism: Secondary | ICD-10-CM | POA: Diagnosis not present

## 2023-11-07 DIAGNOSIS — I5022 Chronic systolic (congestive) heart failure: Secondary | ICD-10-CM | POA: Diagnosis not present

## 2023-11-07 DIAGNOSIS — R1012 Left upper quadrant pain: Secondary | ICD-10-CM | POA: Diagnosis not present

## 2023-11-07 DIAGNOSIS — Z515 Encounter for palliative care: Secondary | ICD-10-CM | POA: Diagnosis not present

## 2023-11-07 NOTE — Progress Notes (Signed)
Patient ID: Kevin Rigg., male   DOB: 1961-11-07, 62 y.o.   MRN: [id number]   Advanced Heart Failure VAD Team Note  PCP-Cardiologist: Nona Dell, MD   Subjective:    Patient is very comfortable this morning with Dilaudid PCA.  No complaints.  Wife at bedside, other family coming in later today.   Pseudomonas on blood cultures.  Currently on comfort care, no antbiotics and not drawing labs.   LVAD INTERROGATION:  HeartMate 3 LVAD:   Flow 4.2 liters/min, speed 5600, power 4, PI 3.4.    Objective:    Vital Signs:   Resp:  [20-23] 20 (11/29 0440) SpO2:  [90 %-92 %] 90 % (11/29 0440) FiO2 (%):  [0 %-30 %] 0 % (11/29 0440) Last BM Date : 10/15/23 Mean arterial Pressure 80  Intake/Output:   Intake/Output Summary (Last 24 hours) at 11/07/2023 4098 Last data filed at 11/06/2023 1909 Gross per 24 hour  Intake 1 ml  Output 100 ml  Net -99 ml     Physical Exam    General: Well appearing this am. NAD.  HEENT: Normal. Neck: Supple, JVP 7-8 cm. Carotids OK.  Cardiac:  Mechanical heart sounds with LVAD hum present.  Lungs:  CTAB, normal effort.  Abdomen:  NT, ND, no HSM. Large soft and nontender ventral hernia. +BS  LVAD exit site: Dressed Extremities:  Warm and dry. No cyanosis, clubbing, rash, or edema.  Neuro:  Alert & oriented x 3. Cranial nerves grossly intact. Moves all 4 extremities w/o difficulty. Affect pleasant     Telemetry   Off telemetry  Labs   Basic Metabolic Panel: Recent Labs  Lab 11/05/23 0021 11/05/23 0029  NA 130* 136  K 3.4* 3.5  CL 101 102  CO2 20*  --   GLUCOSE 109* 104*  BUN 13 12  CREATININE 0.90 1.00  CALCIUM 8.0*  --     Liver Function Tests: Recent Labs  Lab 11/05/23 0021  AST 30  ALT 25  ALKPHOS 125  BILITOT 0.6  PROT 5.8*  ALBUMIN 2.1*   Recent Labs  Lab 11/05/23 0021  LIPASE 56*   No results for input(s): "AMMONIA" in the last 168 hours.  CBC: Recent Labs  Lab 11/05/23 0021 11/05/23 0029  WBC 14.1*   --   NEUTROABS 11.8*  --   HGB 7.9* 8.2*  HCT 24.5* 24.0*  MCV 76.3*  --   PLT 252  --     INR: Recent Labs  Lab 11/05/23 0021  INR 2.0*    Other results: EKG:    Imaging   No results found.   Medications:     Scheduled Medications:  HYDROmorphone   Intravenous Q4H   tamsulosin  0.4 mg Oral Daily    Infusions:   PRN Medications: acetaminophen, antiseptic oral rinse, diphenhydrAMINE **OR** diphenhydrAMINE, glycopyrrolate **OR** glycopyrrolate **OR** glycopyrrolate, haloperidol **OR** haloperidol **OR** haloperidol lactate, LORazepam, Muscle Rub, ondansetron (ZOFRAN) IV, mouth rinse, polyvinyl alcohol, traMADol    Assessment/Plan:    Sepsis -Presenting with acutely worsening abdominal pain at driveline and hernia sites, productive cough and dyspnea. -WBCs 14, blood cultures growing Pseudomonas likely from chronic driveline infection and suspect seeding of LVAD with new splenic infarct seen on CT abdomen/pelvis.  -Off antibiotics now with comfort care.   2. Recurrent driveline infection: Multiple DL infection. Cultures have grown acinetobacter, MRSA and pseudomonas Discharged from the hospital on 09/04/2023 with recurrent driveline infection growing Pseudomonas. Wound culture 10/1 grew MRSA. Readmitted 10/30 Bcx 2/2 Pseudomonas.  Blood cultures this admission with Pseudomonas despite adherence with home meropenem and minocycline.  - As above, off abx with comfort care.    3.  Chronic systolic CHF s/p Heartmate 3 LVAD on 11/17/20. Echo 10/11/23 EF 40-45% admit LVAD cannula in apex. - Warfarin stopped with comfort care, not checking labs.    4.  CAD S/p CABG 12/20 - No chest pain   5.  Paroxysmal atrial fibrillation status post maze and left atrial appendage clip. NSR this admission. - Off amiodarone with hyperthyroidism.   6. Type 2 diabetes - SSI   7. Hx methamphetamine/polysubstance abuse: No recent use   8. Hx Hyperthyroidism:  Likely related to amiodarone.  He is now off amiodarone and methimazole.    9. Anemia (chronic blood loss): stable - Hgb 7.9 last check   10. COPD: Prior smoker.  - Home inhalers/nebs ordered   11. Ventral hernia: Large ventral hernia, not tender today.  Does not appear incarcerated.  No bowel obstruction on abdominal CT this admission.  Not candidate for surgery due to proximity of hernia to driveline tunnel. Intolerant of abdominal binder.     12. Pain: Patient has had intractable pain at driveline site at ventral hernia.  Now on Dilaudid PCA with good pain control.   Patient now with comfort care, has been seen by our palliative care service.  He has had intractable pain and also has an intractable driveline infection with likely seeding of his LVAD (new splenic infarction).  I talked again with his wife and him this morning, he is at peace with his decision to proceed with comfort care.  We anticipate in-hospital death.  He has a number of family members who will be visiting today.  After that, he plans to have his LVAD disconnected with anticipation that he will expire.   I reviewed the LVAD parameters from today, and compared the results to the patient's prior recorded data.  No programming changes were made.  The LVAD is functioning within specified parameters.  The patient performs LVAD self-test daily.  LVAD interrogation was negative for any significant power changes, alarms or PI events/speed drops.  LVAD equipment check completed and is in good working order.  Back-up equipment present.   LVAD education done on emergency procedures and precautions and reviewed exit site care.  Length of Stay: 2  Marca Ancona, MD 11/07/2023, 7:12 AM  VAD Team --- VAD ISSUES ONLY--- Pager [phone] (7am - 7am)  Advanced Heart Failure Team  Pager [phone] (M-F; 7a - 5p)  Please contact [facility] for night-coverage after hours (5p -7a ) and weekends on [email or web address]

## 2023-11-07 NOTE — Progress Notes (Signed)
PCA syringe replaced. Medication unable to scan on MAR. Shanda Bumps RN verified medication before changing syringe. Unable to document waste in pyxis machine. Wasted remaining Dilaudid 1ml in medication waste bin with [employer].

## 2023-11-07 NOTE — Progress Notes (Signed)
Daily Progress Note   Patient Name: Kevin Underwood.       Date: 11/07/2023 DOB: 09-26-1961  Age: 62 y.o. MRN#: [id number] Attending Physician: Dorthula Nettles, DO Primary Care Physician: [name], FNP Admit Date: 11/04/2023 Length of Stay: 2 days  Reason for Consultation/Follow-up: Establishing goals of care, Terminal Care, and Withdrawal of life-sustaining treatment  HPI/Patient Profile:  62 y.o. male  with past medical history of systolic HF, 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 type 2 diabetes mellitus, COPD, and hypertension. 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 He has a history of chronic driveline infections and stated poor quality of life for the past several months.  Presented to the ED with worsening abdominal pain at driveline and hernia sites, cough, fever.  He was admitted on 11/04/2023 with possible sepsis, recurrent driveline infection, heart failure, and others.  His primary justification for admission is for pain management and comfort care at end-of-life including discontinuation of LVAD.   Palliative medicine was consulted for symptom management and end-of-life care.  Subjective:   Subjective: Chart Reviewed. Updates received. Patient Assessed. Created space and opportunity for patient  and family to explore thoughts and feelings regarding current medical situation.  Today's Discussion: Today saw the patient at the bedside, his wife is present as well.  He is sitting in the bedside chair and appears much more comfortable and relaxed than my initial visit.  He shares that he is feeling much better, his pain is well-managed on current PCA regimen.  He also feels that the Ativan has helped substantially for his anxiety.  He shares that he was able to visit with some family yesterday.  No family are coming  later today as well.  He is happy for the time that he has to speak with his family.  When I entered, the LVAD coordinator was just leaving the room and shares that they are available for whenever the patient decides to discontinue his LVAD support.  She expressed that it is his decision on when he would like this to occur.  I offered that palliative medicine will continue to follow the patient for comfort care and symptom management.  We will also be present during LVAD removal to ensure comfort during this process.  I provided emotional and general support through therapeutic listening, empathy, sharing of stories, therapeutic touch, and other techniques. I answered all questions and addressed all concerns to the best of my ability.  Review of Systems  Constitutional:        Pain well-managed on current regimen  Respiratory:  Negative for chest tightness and shortness of breath.   Cardiovascular:  Negative for chest pain.  Gastrointestinal:  Negative for nausea and vomiting.    Objective:   Vital Signs:  BP 99/74 (BP Location: Left Arm)   Pulse 89   Temp 98.2 F (36.8 C) (Oral)   Resp 16   Ht 5\' 10"  (1.778 m)   Wt 90.1 kg   SpO2 92%   BMI 28.50 kg/m   Physical Exam Vitals and nursing note reviewed.  Constitutional:      General: He is not in acute distress.    Appearance: He is ill-appearing.     Comments: Sitting bedside  Pulmonary:     Effort: Pulmonary effort is normal. No respiratory distress.  Neurological:     Mental Status: He  is alert.     Palliative Assessment/Data: 50%    Existing Vynca/ACP Documentation: Advanced directive signed 11/15/2020   Assessment & Plan:   Impression: Present on Admission:  Sepsis (HCC)  62 year old male with acute presentation chronic comorbidities as described above. The patient is an LVAD patient has been in and out of the hospital in recent months with persistent and intractable driveline infection. It appears the infection  is ascending, possible endocarditis and splenic infarct related to this. Because of persistent pain over the past several months, substantially declining quality of life in recent months, the patient has elected to transition to comfort care. He is electing in-hospital death after that time to spend time with his family over the next 1 to 2 days. Overall prognosis is grave.   SUMMARY OF RECOMMENDATIONS   DNR-comfort Continued comfort care See symptom management orders below Continue emotional support of patient and family We are available for whenever the patient decides to withdraw his LVAD support Palliative medicine will see the patient daily for symptom management needs  Symptom Management:  Tylenol 650 mg p.o. every 4 hours as needed headache/mild pain Benadryl 12.5 mg IV every 6 hours as needed itching Robinul 0.2 mg IV every 4 hours as needed excessive secretions Haldol 0.5 mg IV every 4 hours as needed agitation or delirium Zofran 4 mg IV every 6 hours as needed nausea or vomiting Ultram 50 mg p.o. every 6 hours as needed severe pain Polyvinyl alcohol 1.4% ophthalmic 1 drop OU 4 times daily as needed dry eyes Dilaudid PCA: No loading dose, 1 mg/h basal rate, 0.5 mg every 10 minutes bolus availability, total hourly maximum dose 4 mg Ativan 1 mg IV every 4 hours as needed anxiety  Code Status: DNR-comfort  Prognosis: Hours - Days  Discharge Planning: Anticipated Hospital Death  Discussed with: Patient, family, medical team, nursing team  Thank you for allowing [place] to participate in the care of Hayward Cass. PMT will continue to support holistically.  Time Total: 30 min  Detailed review of medical records (labs, imaging, vital signs), medically appropriate exam, discussed with treatment team, counseling and education to patient, family, & staff, documenting clinical information, medication management, coordination of care  [name], NP Palliative Medicine Team  Team  Phone # [phone] (Nights/Weekends)  08/07/2021, 8:17 AM

## 2023-11-07 NOTE — Progress Notes (Signed)
LVAD Coordinator Rounding Note:  Pt presented to [facility] overnight due to abdominal pain and shortness of breath.  LVAD is a HM 3 and was implanted on 11/17/20 by Dr Vickey Sages for destination therapy.    Pt has chronic drive line infection. Recently hospitalized with bacteriemia and was discharged from the hospital 11/8 with PICC line- Meropenem 1 g q 8 hrs and PO Minocycline (chronic suppression).    VAD Coordinator on call received page from pt's wife overnight reporting pt experiencing increased abdominal pain, shortness of breath with productive cough (white sputum), and low grade fever. Pt experiences chronic abdominal pain (large ventral hernia/chronic driveline infection). He states pain is acutely worse than baseline. Pt has been managing pain since end of last week with Tramadol.   BC + for pseudomonas 10/31 & 11/4.  He has been drug free and complaint in the past few months. Pt has been compliant with antibiotic regimen of PO Minocycline 200 mg BID and Meropenem 1 gm every 8 hrs for 6 weeks. At this time antibiotics have been stopped as pt wishes to pursue full comfort measures.    Pt sitting in the recliner on my arrival. States his pain is well controlled at this moment with Dilaudid PCA. His family is on their way to visit with him this morning. Pt wishes to proceed with withdraw of care stating "I'm ready." Wishes to spend time with his family to say goodbye. VAD coordinator assured pt that AHF/VAD team is here to support him in his wishes.   Vital signs: (pt full comfort measures) Temp:  HR:  Doppler Pressure: none documented Automatic BP:  O2 Sat: % on RA Wt: 188 lbs  LVAD interrogation reveals:  Speed: 5600 Flow: 4.3 Power: 4.2 w PI: 2.9  Alarms: none Events: 100+ PI events today Hct: 32  Fixed speed:  5600 Low speed limit:  5300  Drive Line: Pt comfort care. VAD Coordinator discussed that we will focus solely on comfort and if pt or pt's family requests dressing change  we will be happy to perform. Bedside nurse made aware.   Labs: Comfort Care  Anticoagulation Plan: -INR Goal: 2.0 - 2.5 -ASA Dose: none  ICD: N/A  Infection:  11/05/23>>Blood cultures>>PSEUDOMONAS AERUGINOSA 11/05/23>> Resp panel>> negative  Drips:  Dilaudid PCA  Plan/Recommendations:  Page VAD coordinator with equipment issues or driveline problems  Alyce Pagan RN VAD Coordinator  Office: [phone]  24/7 Pager: [phone]

## 2023-11-07 NOTE — Plan of Care (Signed)
  Problem: Elimination: Goal: Will not experience complications related to bowel motility Outcome: Progressing Goal: Will not experience complications related to urinary retention Outcome: Progressing   Problem: Pain Management: Goal: General experience of comfort will improve Outcome: Progressing   Problem: Safety: Goal: Ability to remain free from injury will improve Outcome: Progressing

## 2023-11-07 NOTE — TOC Progression Note (Signed)
Transition of Care (TOC) - Progression Note    Patient Details  Name: Kevin Underwood. MRN: [id number] Date of Birth: 16-Aug-1961  Transition of Care [facility] CM/SW Contact  Nicanor Bake Phone Number: [phone] 11/07/2023, 2:38 PM  Clinical Narrative:   HF CSW met with the pt  and wife at bedside. Pt appeared to be relaxed and in a good mood. Pts wife stated that they are doing alright and she is just trying to be here for the pt. CSW explained that she will continue follow up with the family.   TOC will continue following.     Expected Discharge Plan: Hospice Medical Facility Barriers to Discharge: Continued Medical Work up  Expected Discharge Plan and Services   Discharge Planning Services: CM Consult Post Acute Care Choice: Residential Hospice Bed Living arrangements for the past 2 months: Single Family Home                                       Social Determinants of Health (SDOH) Interventions SDOH Screenings   Food Insecurity: No Food Insecurity (11/05/2023)  Housing: Low Risk  (11/05/2023)  Recent Concern: Housing - Medium Risk (11/03/2023)  Transportation Needs: No Transportation Needs (11/05/2023)  Recent Concern: Transportation Needs - Unmet Transportation Needs (09/08/2023)  Utilities: At Risk (11/05/2023)  Alcohol Screen: Low Risk  (11/10/2020)  Depression (PHQ2-9): Low Risk  (10/16/2022)  Financial Resource Strain: High Risk (11/03/2023)  Physical Activity: Inactive (11/10/2020)  Tobacco Use: Medium Risk (11/05/2023)    Readmission Risk Interventions    07/01/2022    9:05 AM  Readmission Risk Prevention Plan  Transportation Screening Complete  Medication Review (RN Care Manager) Complete  PCP or Specialist appointment within 3-5 days of discharge Complete  HRI or Home Care Consult Complete  SW Recovery Care/Counseling Consult Complete  Palliative Care Screening Not Applicable  Skilled Nursing Facility Not Applicable

## 2023-11-08 DIAGNOSIS — I5041 Acute combined systolic (congestive) and diastolic (congestive) heart failure: Secondary | ICD-10-CM

## 2023-11-08 DIAGNOSIS — Z7189 Other specified counseling: Secondary | ICD-10-CM | POA: Diagnosis not present

## 2023-11-08 DIAGNOSIS — A419 Sepsis, unspecified organism: Secondary | ICD-10-CM | POA: Diagnosis not present

## 2023-11-08 DIAGNOSIS — Z515 Encounter for palliative care: Secondary | ICD-10-CM | POA: Diagnosis not present

## 2023-11-08 DIAGNOSIS — Z66 Do not resuscitate: Secondary | ICD-10-CM | POA: Diagnosis not present

## 2023-11-08 MED ORDER — HYDROMORPHONE HCL-NACL 50-0.9 MG/50ML-% IV SOLN
1.0000 mg/h | INTRAVENOUS | Status: DC
  Administered 2023-11-08: 10 mg/h via INTRAVENOUS
  Administered 2023-11-08: 1 mg/h via INTRAVENOUS
  Filled 2023-11-08 (×2): qty 50

## 2023-11-08 MED ORDER — MIDAZOLAM-SODIUM CHLORIDE 100-0.9 MG/100ML-% IV SOLN
0.5000 mg/h | INTRAVENOUS | Status: DC
  Administered 2023-11-08: 0.5 mg/h via INTRAVENOUS
  Administered 2023-11-08: 20 mg/h via INTRAVENOUS
  Filled 2023-11-08 (×2): qty 100

## 2023-11-08 MED ORDER — HYDROMORPHONE HCL 2 MG/ML IJ SOLN: 4.0000 mg | INTRAMUSCULAR | Status: DC | PRN

## 2023-11-08 MED ORDER — HYDROMORPHONE BOLUS VIA INFUSION: 0.2500 mg | INTRAVENOUS | Status: DC | PRN

## 2023-11-08 MED ORDER — MIDAZOLAM HCL 2 MG/2ML IJ SOLN
2.0000 mg | Freq: Once | INTRAMUSCULAR | Status: AC
  Administered 2023-11-08: 2 mg via INTRAVENOUS
  Filled 2023-11-08: qty 2

## 2023-11-08 MED ORDER — PROPOFOL 1000 MG/100ML IV EMUL
5.0000 ug/kg/min | INTRAVENOUS | Status: DC
  Administered 2023-11-08: 20 ug/kg/min via INTRAVENOUS
  Filled 2023-11-08: qty 100

## 2023-11-09 LAB — CULTURE, BLOOD (ROUTINE X 2): Special Requests: ADEQUATE

## 2023-11-09 NOTE — Progress Notes (Signed)
Wasted 96ml of propofol, Versed 85ml, Dilaudid drip 43ml, and Dilaudid PCA 29ml with Romualdo Bolk, RN.

## 2023-11-09 NOTE — Progress Notes (Signed)
Daily Progress Note   Patient Name: Randall Weigold.       Date: 10/31/2023 DOB: [birth year]  Age: 62 y.o. MRN#: [id number] Attending Physician: Dorthula Nettles, DO Primary Care Physician: [name], FNP Admit Date: 11/04/2023 Length of Stay: 3 days  Reason for Consultation/Follow-up: Establishing goals of care, Terminal Care, and Withdrawal of life-sustaining treatment  HPI/Patient Profile:  62 y.o. male  with past medical history of systolic HF, 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 type 2 diabetes mellitus, COPD, and hypertension. 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 He has a history of chronic driveline infections and stated poor quality of life for the past several months.  Presented to the ED with worsening abdominal pain at driveline and hernia sites, cough, fever.  He was admitted on 11/04/2023 with possible sepsis, recurrent driveline infection, heart failure, and others.  His primary justification for admission is for pain management and comfort care at end-of-life including discontinuation of LVAD.   Palliative medicine was consulted for symptom management and end-of-life care.  Subjective:   Subjective: Chart Reviewed. Updates received. Patient Assessed. Created space and opportunity for patient  and family to explore thoughts and feelings regarding current medical situation.  Today's Discussion: Today I saw the patient at the bedside, his wife and sister are present as well.  He is in the bed and looks substantially changed compared to yesterday.  He is minimally responsive, appears to be quite ill.  His wife states that he had a good day yesterday, pretty good overnight.  Early this morning he spiked a very high fever and became less responsive.  We shared that it is likely his sepsis taking over, which she  agreed.  She states that the LVAD team is on their way to start turning off his LVAD for anticipated hospital death, as previously planned.  She is happy that his family was able to come yesterday and his sister was here this morning.  Dr. Gasper Lloyd with the advanced heart failure team and the LVAD coordinator arrived at the patient's room.  We discussed the plan for palliative sedation prior to discontinuing the LVAD.  The sedation and pain management was effectively run by Dr. Gasper Lloyd and I provided assistance and recommendations/support.  He was started on Versed and Dilaudid drips with boluses provided as needed due to apparent discomfort with groaning, elevated respiratory rate, tachycardia, etc.  After some time when the patient appeared comfortable and sedated, the LVAD coordinator discontinued the VAD device.  After disconnection of the device he appeared to remain with air hunger and tachypnea and subsequently a propofol drip was started.  At this time I had to leave the room to attend to another matter briefly.  When I returned the patient had just passed.  His wife was tearful at the bedside and I helped provide emotional support along with the advanced heart failure team and nursing team.  I offered condolences to the patient's wife.  I provided emotional and general support through therapeutic listening, empathy, sharing of stories, therapeutic touch, and other techniques. I answered all questions and addressed all concerns to the best of my ability.  Review of Systems  Unable to perform ROS: Acuity of condition    Objective:   Vital Signs:  BP 99/74 (BP Location: Left Arm)   Pulse 98   Temp 98 F (36.7 C) (  Oral)   Resp 18   Ht 5\' 10"  (1.778 m)   Wt 90.1 kg   SpO2 94%   BMI 28.50 kg/m   Physical Exam Vitals and nursing note reviewed.  Constitutional:      General: He is in acute distress (Tachypnea, grunting).     Appearance: He is ill-appearing.     Comments: Sitting  bedside  HENT:     Head: Normocephalic and atraumatic.  Cardiovascular:     Rate and Rhythm: Tachycardia present.  Pulmonary:     Effort: Tachypnea and respiratory distress present.     Comments: Signs of air hunger Abdominal:     General: There is distension.     Comments: Noted large ventral hernia  Skin:    General: Skin is warm and dry.  Neurological:     Mental Status: He is unresponsive.     Palliative Assessment/Data: 10%    Existing Vynca/ACP Documentation: Advanced directive signed 11/15/2020   Assessment & Plan:   Impression: Present on Admission:  Sepsis (HCC)  62 year old male with acute presentation chronic comorbidities as described above. The patient is an LVAD patient has been in and out of the hospital in recent months with persistent and intractable driveline infection. It appears the infection is ascending, possible endocarditis and splenic infarct related to this. Because of persistent pain over the past several months, substantially declining quality of life in recent months, the patient has elected to transition to comfort care.  After time to spend with family over the preceding 1 to 2 days the patient had a substantial decline this morning.  Is at this time, in accordance with the patient's wishes, and his wife elected for palliative sedation and discontinuing of the LVAD device.  Shortly after the patient passed peacefully.  SUMMARY OF RECOMMENDATIONS   Patient has passed End of life care and comfort care were provided during withdrawal of life support Emotional support to patient's wife  Symptom Management:  Tylenol 650 mg p.o. every 4 hours as needed headache/mild pain Benadryl 12.5 mg IV every 6 hours as needed itching Robinul 0.2 mg IV every 4 hours as needed excessive secretions Haldol 0.5 mg IV every 4 hours as needed agitation or delirium Zofran 4 mg IV every 6 hours as needed nausea or vomiting Ultram 50 mg p.o. every 6 hours as needed  severe pain Polyvinyl alcohol 1.4% ophthalmic 1 drop OU 4 times daily as needed dry eyes Dilaudid PCA: No loading dose, 1 mg/h basal rate, 0.5 mg every 10 minutes bolus availability, total hourly maximum dose 4 mg Ativan 1 mg IV every 4 hours as needed anxiety  Code Status: DNR-comfort  Prognosis: Hours - Days  Discharge Planning: Anticipated Hospital Death  Discussed with: Patient, family, medical team, nursing team  Thank you for allowing [place] to participate in the care of Kriz Canedy. PMT will continue to support holistically.  Time Total: 120 min  Detailed review of medical records (labs, imaging, vital signs), medically appropriate exam, discussed with treatment team, counseling and education to patient, family, & staff, documenting clinical information, medication management, coordination of care  [name], NP Palliative Medicine Team  Team Phone # [phone] (Nights/Weekends)  08/07/2021, 8:17 AM

## 2023-11-09 NOTE — Progress Notes (Signed)
Mr. Carpenito was transitioned to comfort care measures shortly after admission as per his wishes. He reported he could no longer manage the severe pain associated with his large ventral hernia. He was tired of recurrent admissions to the hospital and now constant dyspnea. This morning paged by nursing staff due to worsening air hunger and discomfort. On my arrival, VAD coordinator, nurse and palliative care team were in the room. His wife, [name] expressed her concerns that Mr. Zanin was uncomfortable and he would not wish for any discomfort as he passed. On my exam, Mr. Timmer clearly uncomfortable with signs of air hunger (elevated respiratory rate, groaning, etc). In accordance with his wishes and his wife's wishes, we started a versed & dilaudid infusion. He remained uncomfortable. We started dilaudid/versed boluses in agreement with family & palliative care staff. Once he appeared more comfortable, LVAD was disconnected. Afterwards he continued to have a RR>20 with signs of air hunger. Due to lack of comfort, palliative propofol drip was started for air hunger. Discussed with RN, palliative care and LVAD coordinator.   Time of death: 1210/11/26.  Wife at bedside and thankful. Support provided by LVAD and palliative care staff.   Takoda Janowiak 1:29 PM

## 2023-11-09 NOTE — Progress Notes (Signed)
Wasted 1.5 ml Dilaudid from repalced ( that was loaded at 1110 11/29) syringe in PCA pump.  Janett Labella, RN Ronnette Juniper, RN

## 2023-11-09 NOTE — Progress Notes (Signed)
Patient time of death 1211, verified by two RN's, myself and Lorri Frederick. Wife [name], has left with all of patients belongings, given patient placement's number to call with a funeral home once known. [employer] called referral number, [id number]. Suitable for tissue donation.

## 2023-11-09 NOTE — Progress Notes (Signed)
Patient ID: Kevin Underwood., male   DOB: [birth year], 62 y.o.   MRN: [id number]   Advanced Heart Failure VAD Team Note  PCP-Cardiologist: Nona Dell, MD   Subjective:    - See end of life progress note.  - Patient tachypneic this AM and only intermittently oriented.   LVAD INTERROGATION:  HeartMate 3 LVAD:   Flow 4.1 liters/min, speed 5600, power 4, PI 3-4  Objective:    Vital Signs:   Temp:  [98 F (36.7 C)] 98 F (36.7 C) (11/29 1542) Pulse Rate:  [98] 98 (11/29 1542) Resp:  [15-18] 18 (11/30 0805) SpO2:  [92 %-95 %] 94 % (11/30 0805) FiO2 (%):  [0 %] 0 % (11/30 0805) Last BM Date : 10/15/23 Mean arterial Pressure 80  Intake/Output:   Intake/Output Summary (Last 24 hours) at 10/20/2023 1344 Last data filed at 11/03/2023 1235 Gross per 24 hour  Intake 193.71 ml  Output 100 ml  Net 93.71 ml     Physical Exam    General: uncomfortable HEENT: Normal. Neck: Supple, JVP 7 Cardiac:  Mechanical heart sounds with LVAD hum present.  Lungs:  rapid respiratory rate Abdomen:  NT, ND, no HSM. Large soft and nontender ventral hernia. +BS  LVAD exit site: Dressed Extremities:  Warm and dry. No cyanosis, clubbing, rash, or edema.  Neuro:  AAOX1-2 intermittently   Telemetry   Off telemetry  Labs   Basic Metabolic Panel: Recent Labs  Lab 11/05/23 0021 11/05/23 0029  NA 130* 136  K 3.4* 3.5  CL 101 102  CO2 20*  --   GLUCOSE 109* 104*  BUN 13 12  CREATININE 0.90 1.00  CALCIUM 8.0*  --     Liver Function Tests: Recent Labs  Lab 11/05/23 0021  AST 30  ALT 25  ALKPHOS 125  BILITOT 0.6  PROT 5.8*  ALBUMIN 2.1*   Recent Labs  Lab 11/05/23 0021  LIPASE 56*   No results for input(s): "AMMONIA" in the last 168 hours.  CBC: Recent Labs  Lab 11/05/23 0021 11/05/23 0029  WBC 14.1*  --   NEUTROABS 11.8*  --   HGB 7.9* 8.2*  HCT 24.5* 24.0*  MCV 76.3*  --   PLT 252  --     INR: Recent Labs  Lab 11/05/23 0021  INR 2.0*    Other  results: EKG:    Imaging   No results found.   Medications:     Scheduled Medications:  tamsulosin  0.4 mg Oral Daily    Infusions:  HYDROmorphone Stopped (10/31/2023 1214)   midazolam Stopped (10/23/2023 1214)   propofol (DIPRIVAN) infusion Stopped (10/27/2023 1214)    PRN Medications: acetaminophen, antiseptic oral rinse, glycopyrrolate **OR** glycopyrrolate **OR** glycopyrrolate, haloperidol **OR** haloperidol **OR** haloperidol lactate, HYDROmorphone, HYDROmorphone (DILAUDID) injection, LORazepam, Muscle Rub, ondansetron (ZOFRAN) IV, mouth rinse, polyvinyl alcohol, traMADol    Assessment/Plan:    Sepsis -Presenting with acutely worsening abdominal pain at driveline and hernia sites, productive cough and dyspnea. -WBCs 14, blood cultures growing Pseudomonas likely from chronic driveline infection and suspect seeding of LVAD with new splenic infarct seen on CT abdomen/pelvis.  -Off antibiotics now with comfort care.   2. Recurrent driveline infection: Multiple DL infection. Cultures have grown acinetobacter, MRSA and pseudomonas Discharged from the hospital on 09/04/2023 with recurrent driveline infection growing Pseudomonas. Wound culture 10/1 grew MRSA. Readmitted 10/30 Bcx 2/2 Pseudomonas. Blood cultures this admission with Pseudomonas despite adherence with home meropenem and minocycline.  - As above, off abx  with comfort care.  - see end of life progress note. Patient tachypneic this AM; escalating comfort care measures after discussion with palliative care team, LVAD coordinator, family & bedside nursing staff.    3.  Chronic systolic CHF s/p Heartmate 3 LVAD on 11/17/20. Echo 10/11/23 EF 40-45% admit LVAD cannula in apex. - Warfarin stopped with comfort care, not checking labs.    4.  CAD S/p CABG 12/20 - No chest pain   5.  Paroxysmal atrial fibrillation status post maze and left atrial appendage clip. NSR this admission. - Off amiodarone with hyperthyroidism.   6.  Type 2 diabetes - SSI   7. Hx methamphetamine/polysubstance abuse: No recent use   8. Hx Hyperthyroidism:  Likely related to amiodarone. He is now off amiodarone and methimazole.    9. Anemia (chronic blood loss): stable - Hgb 7.9 last check   10. COPD: Prior smoker.  - Home inhalers/nebs ordered   11. Ventral hernia: Large ventral hernia, not tender today.  Does not appear incarcerated.  No bowel obstruction on abdominal CT this admission.  Not candidate for surgery due to proximity of hernia to driveline tunnel. Intolerant of abdominal binder.     12. Pain: Continue dilaudid PCA; versed PRN boluses for discomfort; dilaudid PRN bolus for air hunger.   See end of life progress note from 11/30 at 13:22.    I reviewed the LVAD parameters from today, and compared the results to the patient's prior recorded data.  No programming changes were made.  The LVAD is functioning within specified parameters.  The patient performs LVAD self-test daily.  LVAD interrogation was negative for any significant power changes, alarms or PI events/speed drops.  LVAD equipment check completed and is in good working order.  Back-up equipment present.   LVAD education done on emergency procedures and precautions and reviewed exit site care.  Length of Stay: 3  Dorthula Nettles, DO 10/27/2023, 1:44 PM  VAD Team --- VAD ISSUES ONLY--- Pager [phone] (7am - 7am)  Advanced Heart Failure Team  Pager [phone] (M-F; 7a - 5p)  Please contact [facility] for night-coverage after hours (5p -7a ) and weekends on [email or web address]

## 2023-11-09 DEATH — deceased

## 2023-11-10 ENCOUNTER — Encounter (HOSPITAL_COMMUNITY): Payer: 59

## 2023-11-10 ENCOUNTER — Other Ambulatory Visit (HOSPITAL_COMMUNITY): Payer: 59

## 2023-11-13 ENCOUNTER — Other Ambulatory Visit (HOSPITAL_COMMUNITY): Payer: Self-pay | Admitting: Cardiology

## 2023-11-13 DIAGNOSIS — E0590 Thyrotoxicosis, unspecified without thyrotoxic crisis or storm: Secondary | ICD-10-CM

## 2023-11-18 ENCOUNTER — Inpatient Hospital Stay: Payer: 59 | Admitting: Internal Medicine

## 2023-12-02 LAB — CULTURE, BLOOD (ROUTINE X 2): Special Requests: ADEQUATE

## 2023-12-10 NOTE — Death Summary Note (Signed)
  Advanced Heart Failure Death Summary  Death Summary   Patient ID: Kevin Underwood. MRN: [id number], DOB/AGE: 63-Aug-1962 63 y.o. Admit date: 11/04/2023 D/C date:     11/13/2023   Primary Discharge Diagnoses:  Sepsis Recurrent driveline infection Chronic systolic CHF s/p HM III LVAD Ventral hernia  Pain  Hospital Course:   Wataru Alvillar. is a 63 y.o. male with history of systolic HF/iCM s/p HM III VAD, multivessel CAD status post CABG, paroxysmal atrial fibrillation, COPD, and hypertension   Has history of chronic driveline infections. In July 2024 wound cultures grew MSSA and Pseudomonas. Completed long course of IV abx.    Admitted from VAD clinic 10/15 for severe abdominal pain and concern for ongoing infection. Treated with IV abx and discharge with minocycline and cipro. Stay was prolonged by uncontrolled that was improved at discharge with pain clinic follow up.   Presented 10/08/23 for shortness of breath and productive cough with pleuritic chest pain. BCx grew Pseudomonas, Cipro resistant thus no PO abx suppressive options. ID consulted. IV Abx switched to meropenum. Minocycline continued for acinetobacter. Got single lumen PICC for continuation of home meropenum.    Presented again on 11/05/23 with worsening abdominal pain, cough and fever. CT A/P w/ stable soft tissue thickening around driveline and large ventral hernia, new splenic infarct noted. Blood cultures + for pseudomonas. He declined further IV antibiotics and other aggressive care. D/t rapid functional decline, extensive infection and severe pain he wished to transition to comfort care. Palliative care consulted and comfort medications initiated. On 11/30, he became increasingly uncomfortable. Palliative infusions added for air hunger. Please see progress note 10/28/2023 at 13:22 for details. LVAD disconnected once he became more comfortable. He passed peacefully with his wife at the bedside at 1211 on 10/31/2023.    Significant Diagnostic Studies CT C/A/P 11/05/23 IMPRESSION: 1. 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 2. Stable soft tissue thickening surrounding the drive line as it enters the epigastrium and tracking to the left ventricular assist device itself, unchanged from prior examination. 3. Moderate emphysema. Stable bronchial wall thickening in keeping with chronic airway inflammation. 4. Minimal left nonobstructing nephrolithiasis. 5. Large broad-based ventral hernia containing multiple loops of unremarkable small bowel as well as the mid transverse colon.    Consultations  Palliative Care  Duration of Discharge Encounter: Greater than 35 minutes   Signed, Roseann Kees N, PA-C 11/13/2023, 3:20 PM
